# Patient Record
Sex: Female | Born: 1965 | Race: White | Hispanic: No | Marital: Married | Smoking: Former smoker
Health system: Southern US, Community
[De-identification: ages and names within clinical notes are randomized; demographics above are authoritative.]

## PROBLEM LIST (undated history)

## (undated) DIAGNOSIS — R519 Headache, unspecified: Secondary | ICD-10-CM

## (undated) DIAGNOSIS — E039 Hypothyroidism, unspecified: Secondary | ICD-10-CM

## (undated) DIAGNOSIS — N186 End stage renal disease: Secondary | ICD-10-CM

## (undated) DIAGNOSIS — N289 Disorder of kidney and ureter, unspecified: Secondary | ICD-10-CM

## (undated) DIAGNOSIS — D899 Disorder involving the immune mechanism, unspecified: Secondary | ICD-10-CM

## (undated) DIAGNOSIS — J189 Pneumonia, unspecified organism: Secondary | ICD-10-CM

## (undated) DIAGNOSIS — F32A Depression, unspecified: Secondary | ICD-10-CM

## (undated) DIAGNOSIS — F329 Major depressive disorder, single episode, unspecified: Secondary | ICD-10-CM

## (undated) DIAGNOSIS — N189 Chronic kidney disease, unspecified: Secondary | ICD-10-CM

## (undated) DIAGNOSIS — I10 Essential (primary) hypertension: Secondary | ICD-10-CM

## (undated) DIAGNOSIS — R51 Headache: Secondary | ICD-10-CM

## (undated) DIAGNOSIS — M1990 Unspecified osteoarthritis, unspecified site: Secondary | ICD-10-CM

## (undated) DIAGNOSIS — E119 Type 2 diabetes mellitus without complications: Secondary | ICD-10-CM

## (undated) DIAGNOSIS — D849 Immunodeficiency, unspecified: Secondary | ICD-10-CM

## (undated) HISTORY — DX: Depression, unspecified: F32.A

## (undated) HISTORY — PX: BACK SURGERY: SHX140

## (undated) HISTORY — DX: Major depressive disorder, single episode, unspecified: F32.9

## (undated) HISTORY — DX: Disorder of kidney and ureter, unspecified: N28.9

## (undated) HISTORY — DX: Immunodeficiency, unspecified: D84.9

## (undated) HISTORY — PX: CATARACT EXTRACTION W/ INTRAOCULAR LENS  IMPLANT, BILATERAL: SHX1307

## (undated) HISTORY — PX: TOE AMPUTATION: SHX809

## (undated) HISTORY — DX: End stage renal disease: N18.6

## (undated) HISTORY — PX: DG AV DIALYSIS GRAFT DECLOT OR: HXRAD813

## (undated) HISTORY — PX: NEPHRECTOMY TRANSPLANTED ORGAN: SUR880

## (undated) HISTORY — DX: Disorder involving the immune mechanism, unspecified: D89.9

## (undated) HISTORY — PX: AV FISTULA PLACEMENT: SHX1204

## (undated) HISTORY — DX: Essential (primary) hypertension: I10

## (undated) HISTORY — DX: Hypothyroidism, unspecified: E03.9

---

## 1997-12-18 ENCOUNTER — Inpatient Hospital Stay (HOSPITAL_COMMUNITY): Admission: AD | Admit: 1997-12-18 | Discharge: 1997-12-20 | Payer: Self-pay | Admitting: Internal Medicine

## 1997-12-26 ENCOUNTER — Encounter: Admission: RE | Admit: 1997-12-26 | Discharge: 1997-12-26 | Payer: Self-pay | Admitting: Internal Medicine

## 1998-05-24 ENCOUNTER — Inpatient Hospital Stay: Admission: EM | Admit: 1998-05-24 | Discharge: 1998-05-25 | Payer: Self-pay | Admitting: Emergency Medicine

## 1998-05-26 ENCOUNTER — Encounter: Payer: Self-pay | Admitting: Emergency Medicine

## 1998-11-24 ENCOUNTER — Encounter: Payer: Self-pay | Admitting: Infectious Diseases

## 1998-11-24 ENCOUNTER — Encounter: Payer: Self-pay | Admitting: Emergency Medicine

## 1998-11-24 ENCOUNTER — Inpatient Hospital Stay (HOSPITAL_COMMUNITY): Admission: EM | Admit: 1998-11-24 | Discharge: 1998-11-28 | Payer: Self-pay | Admitting: Emergency Medicine

## 1999-02-13 ENCOUNTER — Encounter: Admission: RE | Admit: 1999-02-13 | Discharge: 1999-02-13 | Payer: Self-pay | Admitting: Hematology and Oncology

## 1999-02-26 ENCOUNTER — Encounter: Admission: RE | Admit: 1999-02-26 | Discharge: 1999-02-26 | Payer: Self-pay | Admitting: Obstetrics & Gynecology

## 1999-02-26 ENCOUNTER — Encounter: Admission: RE | Admit: 1999-02-26 | Discharge: 1999-05-27 | Payer: Self-pay | Admitting: Obstetrics & Gynecology

## 1999-02-26 ENCOUNTER — Other Ambulatory Visit: Admission: RE | Admit: 1999-02-26 | Discharge: 1999-02-26 | Payer: Self-pay | Admitting: Obstetrics & Gynecology

## 1999-02-26 ENCOUNTER — Ambulatory Visit (HOSPITAL_COMMUNITY): Admission: RE | Admit: 1999-02-26 | Discharge: 1999-02-26 | Payer: Self-pay | Admitting: Obstetrics & Gynecology

## 1999-02-26 ENCOUNTER — Encounter: Payer: Self-pay | Admitting: Obstetrics & Gynecology

## 1999-03-05 ENCOUNTER — Encounter: Admission: RE | Admit: 1999-03-05 | Discharge: 1999-03-05 | Payer: Self-pay | Admitting: Obstetrics & Gynecology

## 1999-03-12 ENCOUNTER — Encounter: Payer: Self-pay | Admitting: Obstetrics & Gynecology

## 1999-03-12 ENCOUNTER — Encounter: Admission: RE | Admit: 1999-03-12 | Discharge: 1999-03-12 | Payer: Self-pay | Admitting: Obstetrics & Gynecology

## 1999-03-12 ENCOUNTER — Ambulatory Visit (HOSPITAL_COMMUNITY): Admission: RE | Admit: 1999-03-12 | Discharge: 1999-03-12 | Payer: Self-pay | Admitting: Obstetrics & Gynecology

## 1999-03-16 ENCOUNTER — Emergency Department (HOSPITAL_COMMUNITY): Admission: EM | Admit: 1999-03-16 | Discharge: 1999-03-16 | Payer: Self-pay | Admitting: Emergency Medicine

## 1999-03-17 ENCOUNTER — Inpatient Hospital Stay (HOSPITAL_COMMUNITY): Admission: AD | Admit: 1999-03-17 | Discharge: 1999-03-18 | Payer: Self-pay | Admitting: Obstetrics & Gynecology

## 1999-03-17 ENCOUNTER — Encounter: Payer: Self-pay | Admitting: Obstetrics & Gynecology

## 1999-03-19 ENCOUNTER — Encounter: Admission: RE | Admit: 1999-03-19 | Discharge: 1999-03-19 | Payer: Self-pay | Admitting: Obstetrics & Gynecology

## 1999-03-26 ENCOUNTER — Encounter: Admission: RE | Admit: 1999-03-26 | Discharge: 1999-03-26 | Payer: Self-pay | Admitting: Obstetrics & Gynecology

## 1999-04-02 ENCOUNTER — Encounter: Admission: RE | Admit: 1999-04-02 | Discharge: 1999-04-02 | Payer: Self-pay | Admitting: Obstetrics & Gynecology

## 1999-04-09 ENCOUNTER — Encounter: Admission: RE | Admit: 1999-04-09 | Discharge: 1999-04-09 | Payer: Self-pay | Admitting: Obstetrics & Gynecology

## 1999-04-16 ENCOUNTER — Encounter: Admission: RE | Admit: 1999-04-16 | Discharge: 1999-04-16 | Payer: Self-pay | Admitting: Obstetrics & Gynecology

## 1999-04-23 ENCOUNTER — Encounter: Payer: Self-pay | Admitting: *Deleted

## 1999-04-23 ENCOUNTER — Inpatient Hospital Stay (HOSPITAL_COMMUNITY): Admission: RE | Admit: 1999-04-23 | Discharge: 1999-04-26 | Payer: Self-pay | Admitting: *Deleted

## 1999-04-30 ENCOUNTER — Encounter: Admission: RE | Admit: 1999-04-30 | Discharge: 1999-04-30 | Payer: Self-pay | Admitting: Obstetrics & Gynecology

## 1999-05-02 ENCOUNTER — Observation Stay (HOSPITAL_COMMUNITY): Admission: RE | Admit: 1999-05-02 | Discharge: 1999-05-03 | Payer: Self-pay | Admitting: *Deleted

## 1999-05-07 ENCOUNTER — Ambulatory Visit (HOSPITAL_COMMUNITY): Admission: RE | Admit: 1999-05-07 | Discharge: 1999-05-07 | Payer: Self-pay | Admitting: *Deleted

## 1999-05-14 ENCOUNTER — Encounter: Admission: RE | Admit: 1999-05-14 | Discharge: 1999-05-14 | Payer: Self-pay | Admitting: Obstetrics & Gynecology

## 1999-05-28 ENCOUNTER — Encounter: Admission: RE | Admit: 1999-05-28 | Discharge: 1999-05-28 | Payer: Self-pay | Admitting: Obstetrics & Gynecology

## 1999-06-04 ENCOUNTER — Encounter: Admission: RE | Admit: 1999-06-04 | Discharge: 1999-06-04 | Payer: Self-pay | Admitting: Obstetrics & Gynecology

## 1999-06-18 ENCOUNTER — Encounter: Admission: RE | Admit: 1999-06-18 | Discharge: 1999-06-18 | Payer: Self-pay | Admitting: Obstetrics & Gynecology

## 1999-06-23 ENCOUNTER — Ambulatory Visit (HOSPITAL_COMMUNITY): Admission: RE | Admit: 1999-06-23 | Discharge: 1999-06-23 | Payer: Self-pay | Admitting: Obstetrics & Gynecology

## 1999-06-23 ENCOUNTER — Encounter: Payer: Self-pay | Admitting: Obstetrics & Gynecology

## 1999-06-25 ENCOUNTER — Encounter: Admission: RE | Admit: 1999-06-25 | Discharge: 1999-06-25 | Payer: Self-pay | Admitting: Obstetrics & Gynecology

## 1999-07-09 ENCOUNTER — Encounter: Admission: RE | Admit: 1999-07-09 | Discharge: 1999-07-09 | Payer: Self-pay | Admitting: Obstetrics & Gynecology

## 1999-07-09 HISTORY — PX: TUBAL LIGATION: SHX77

## 1999-07-16 ENCOUNTER — Encounter: Admission: RE | Admit: 1999-07-16 | Discharge: 1999-07-16 | Payer: Self-pay | Admitting: Obstetrics & Gynecology

## 1999-07-24 ENCOUNTER — Inpatient Hospital Stay (HOSPITAL_COMMUNITY): Admission: RE | Admit: 1999-07-24 | Discharge: 1999-08-02 | Payer: Self-pay | Admitting: Obstetrics & Gynecology

## 1999-07-24 ENCOUNTER — Encounter (INDEPENDENT_AMBULATORY_CARE_PROVIDER_SITE_OTHER): Payer: Self-pay | Admitting: Specialist

## 1999-07-25 ENCOUNTER — Encounter: Payer: Self-pay | Admitting: Obstetrics & Gynecology

## 1999-07-27 ENCOUNTER — Encounter: Payer: Self-pay | Admitting: Obstetrics & Gynecology

## 1999-08-04 ENCOUNTER — Inpatient Hospital Stay (HOSPITAL_COMMUNITY): Admission: EM | Admit: 1999-08-04 | Discharge: 1999-08-04 | Payer: Self-pay | Admitting: *Deleted

## 1999-08-14 ENCOUNTER — Inpatient Hospital Stay (HOSPITAL_COMMUNITY): Admission: RE | Admit: 1999-08-14 | Discharge: 1999-08-15 | Payer: Self-pay | Admitting: Ophthalmology

## 1999-08-14 ENCOUNTER — Encounter: Payer: Self-pay | Admitting: Ophthalmology

## 1999-08-20 ENCOUNTER — Encounter: Admission: RE | Admit: 1999-08-20 | Discharge: 1999-08-20 | Payer: Self-pay | Admitting: Internal Medicine

## 1999-08-20 ENCOUNTER — Inpatient Hospital Stay (HOSPITAL_COMMUNITY): Admission: AD | Admit: 1999-08-20 | Discharge: 1999-08-20 | Payer: Self-pay | Admitting: Obstetrics and Gynecology

## 1999-08-27 ENCOUNTER — Encounter: Admission: RE | Admit: 1999-08-27 | Discharge: 1999-08-27 | Payer: Self-pay | Admitting: Obstetrics & Gynecology

## 1999-09-08 HISTORY — PX: LUMBAR DISC SURGERY: SHX700

## 1999-10-01 ENCOUNTER — Encounter: Admission: RE | Admit: 1999-10-01 | Discharge: 1999-12-26 | Payer: Self-pay | Admitting: Internal Medicine

## 1999-10-04 ENCOUNTER — Encounter: Payer: Self-pay | Admitting: Emergency Medicine

## 1999-10-04 ENCOUNTER — Emergency Department (HOSPITAL_COMMUNITY): Admission: EM | Admit: 1999-10-04 | Discharge: 1999-10-04 | Payer: Self-pay | Admitting: Emergency Medicine

## 1999-10-08 ENCOUNTER — Encounter: Admission: RE | Admit: 1999-10-08 | Discharge: 1999-10-08 | Payer: Self-pay | Admitting: Internal Medicine

## 1999-10-29 ENCOUNTER — Inpatient Hospital Stay (HOSPITAL_COMMUNITY): Admission: EM | Admit: 1999-10-29 | Discharge: 1999-11-02 | Payer: Self-pay | Admitting: *Deleted

## 1999-10-29 ENCOUNTER — Encounter: Payer: Self-pay | Admitting: *Deleted

## 1999-10-30 ENCOUNTER — Encounter: Payer: Self-pay | Admitting: Ophthalmology

## 1999-10-30 ENCOUNTER — Encounter: Payer: Self-pay | Admitting: Internal Medicine

## 1999-11-11 ENCOUNTER — Encounter: Admission: RE | Admit: 1999-11-11 | Discharge: 1999-11-11 | Payer: Self-pay | Admitting: Hematology and Oncology

## 1999-12-07 ENCOUNTER — Inpatient Hospital Stay (HOSPITAL_COMMUNITY): Admission: EM | Admit: 1999-12-07 | Discharge: 1999-12-08 | Payer: Self-pay | Admitting: Emergency Medicine

## 2000-02-25 ENCOUNTER — Encounter: Payer: Self-pay | Admitting: Emergency Medicine

## 2000-02-25 ENCOUNTER — Emergency Department (HOSPITAL_COMMUNITY): Admission: EM | Admit: 2000-02-25 | Discharge: 2000-02-25 | Payer: Self-pay | Admitting: Emergency Medicine

## 2000-02-26 ENCOUNTER — Encounter: Admission: RE | Admit: 2000-02-26 | Discharge: 2000-02-26 | Payer: Self-pay | Admitting: Internal Medicine

## 2000-03-22 ENCOUNTER — Emergency Department (HOSPITAL_COMMUNITY): Admission: EM | Admit: 2000-03-22 | Discharge: 2000-03-22 | Payer: Self-pay | Admitting: *Deleted

## 2000-03-29 ENCOUNTER — Emergency Department (HOSPITAL_COMMUNITY): Admission: EM | Admit: 2000-03-29 | Discharge: 2000-03-29 | Payer: Self-pay | Admitting: Internal Medicine

## 2000-05-16 ENCOUNTER — Encounter: Payer: Self-pay | Admitting: Emergency Medicine

## 2000-05-16 ENCOUNTER — Inpatient Hospital Stay (HOSPITAL_COMMUNITY): Admission: EM | Admit: 2000-05-16 | Discharge: 2000-05-17 | Payer: Self-pay | Admitting: Emergency Medicine

## 2000-06-03 ENCOUNTER — Encounter: Admission: RE | Admit: 2000-06-03 | Discharge: 2000-06-03 | Payer: Self-pay | Admitting: Internal Medicine

## 2000-08-12 ENCOUNTER — Ambulatory Visit (HOSPITAL_COMMUNITY): Admission: RE | Admit: 2000-08-12 | Discharge: 2000-08-13 | Payer: Self-pay | Admitting: Ophthalmology

## 2000-11-20 ENCOUNTER — Emergency Department (HOSPITAL_COMMUNITY): Admission: EM | Admit: 2000-11-20 | Discharge: 2000-11-21 | Payer: Self-pay | Admitting: Emergency Medicine

## 2001-04-18 ENCOUNTER — Emergency Department (HOSPITAL_COMMUNITY): Admission: EM | Admit: 2001-04-18 | Discharge: 2001-04-18 | Payer: Self-pay | Admitting: Emergency Medicine

## 2001-04-18 ENCOUNTER — Encounter: Payer: Self-pay | Admitting: Emergency Medicine

## 2001-05-26 ENCOUNTER — Ambulatory Visit (HOSPITAL_COMMUNITY): Admission: RE | Admit: 2001-05-26 | Discharge: 2001-05-27 | Payer: Self-pay | Admitting: Neurosurgery

## 2001-05-26 ENCOUNTER — Encounter: Payer: Self-pay | Admitting: Neurosurgery

## 2001-07-23 ENCOUNTER — Encounter: Payer: Self-pay | Admitting: Emergency Medicine

## 2001-07-23 ENCOUNTER — Emergency Department (HOSPITAL_COMMUNITY): Admission: EM | Admit: 2001-07-23 | Discharge: 2001-07-23 | Payer: Self-pay | Admitting: Emergency Medicine

## 2001-07-23 ENCOUNTER — Encounter: Payer: Self-pay | Admitting: *Deleted

## 2001-09-03 ENCOUNTER — Encounter: Payer: Self-pay | Admitting: Emergency Medicine

## 2001-09-03 ENCOUNTER — Emergency Department (HOSPITAL_COMMUNITY): Admission: EM | Admit: 2001-09-03 | Discharge: 2001-09-03 | Payer: Self-pay | Admitting: Emergency Medicine

## 2001-09-09 ENCOUNTER — Emergency Department (HOSPITAL_COMMUNITY): Admission: EM | Admit: 2001-09-09 | Discharge: 2001-09-09 | Payer: Self-pay | Admitting: Emergency Medicine

## 2001-09-09 ENCOUNTER — Encounter: Payer: Self-pay | Admitting: Emergency Medicine

## 2001-09-13 ENCOUNTER — Encounter: Admission: RE | Admit: 2001-09-13 | Discharge: 2001-09-13 | Payer: Self-pay

## 2001-09-21 ENCOUNTER — Ambulatory Visit (HOSPITAL_COMMUNITY): Admission: RE | Admit: 2001-09-21 | Discharge: 2001-09-21 | Payer: Self-pay

## 2001-09-28 ENCOUNTER — Encounter: Admission: RE | Admit: 2001-09-28 | Discharge: 2001-09-28 | Payer: Self-pay | Admitting: Internal Medicine

## 2001-09-28 ENCOUNTER — Emergency Department (HOSPITAL_COMMUNITY): Admission: EM | Admit: 2001-09-28 | Discharge: 2001-09-28 | Payer: Self-pay | Admitting: Emergency Medicine

## 2001-10-05 ENCOUNTER — Emergency Department (HOSPITAL_COMMUNITY): Admission: EM | Admit: 2001-10-05 | Discharge: 2001-10-05 | Payer: Self-pay | Admitting: Emergency Medicine

## 2001-10-10 ENCOUNTER — Encounter: Admission: RE | Admit: 2001-10-10 | Discharge: 2001-10-10 | Payer: Self-pay | Admitting: Internal Medicine

## 2001-12-03 ENCOUNTER — Emergency Department (HOSPITAL_COMMUNITY): Admission: EM | Admit: 2001-12-03 | Discharge: 2001-12-03 | Payer: Self-pay

## 2002-01-03 ENCOUNTER — Encounter: Payer: Self-pay | Admitting: Emergency Medicine

## 2002-01-03 ENCOUNTER — Emergency Department (HOSPITAL_COMMUNITY): Admission: EM | Admit: 2002-01-03 | Discharge: 2002-01-03 | Payer: Self-pay | Admitting: Emergency Medicine

## 2002-01-09 ENCOUNTER — Encounter: Admission: RE | Admit: 2002-01-09 | Discharge: 2002-01-09 | Payer: Self-pay

## 2002-01-10 ENCOUNTER — Encounter: Payer: Self-pay | Admitting: Emergency Medicine

## 2002-01-10 ENCOUNTER — Inpatient Hospital Stay (HOSPITAL_COMMUNITY): Admission: EM | Admit: 2002-01-10 | Discharge: 2002-01-13 | Payer: Self-pay | Admitting: Emergency Medicine

## 2002-01-11 ENCOUNTER — Encounter: Payer: Self-pay | Admitting: Internal Medicine

## 2002-01-12 ENCOUNTER — Encounter: Payer: Self-pay | Admitting: Internal Medicine

## 2002-02-10 ENCOUNTER — Inpatient Hospital Stay (HOSPITAL_COMMUNITY): Admission: EM | Admit: 2002-02-10 | Discharge: 2002-02-11 | Payer: Self-pay | Admitting: Emergency Medicine

## 2002-02-10 ENCOUNTER — Encounter: Payer: Self-pay | Admitting: Emergency Medicine

## 2002-03-11 ENCOUNTER — Emergency Department (HOSPITAL_COMMUNITY): Admission: EM | Admit: 2002-03-11 | Discharge: 2002-03-11 | Payer: Self-pay | Admitting: Emergency Medicine

## 2002-03-16 ENCOUNTER — Encounter: Admission: RE | Admit: 2002-03-16 | Discharge: 2002-03-16 | Payer: Self-pay | Admitting: Internal Medicine

## 2002-04-23 ENCOUNTER — Emergency Department (HOSPITAL_COMMUNITY): Admission: EM | Admit: 2002-04-23 | Discharge: 2002-04-23 | Payer: Self-pay | Admitting: Emergency Medicine

## 2002-05-31 ENCOUNTER — Emergency Department (HOSPITAL_COMMUNITY): Admission: EM | Admit: 2002-05-31 | Discharge: 2002-05-31 | Payer: Self-pay

## 2002-06-21 ENCOUNTER — Encounter: Payer: Self-pay | Admitting: Emergency Medicine

## 2002-06-21 ENCOUNTER — Emergency Department (HOSPITAL_COMMUNITY): Admission: EM | Admit: 2002-06-21 | Discharge: 2002-06-22 | Payer: Self-pay | Admitting: *Deleted

## 2002-08-18 ENCOUNTER — Encounter: Payer: Self-pay | Admitting: Emergency Medicine

## 2002-08-18 ENCOUNTER — Emergency Department (HOSPITAL_COMMUNITY): Admission: EM | Admit: 2002-08-18 | Discharge: 2002-08-18 | Payer: Self-pay | Admitting: Emergency Medicine

## 2002-08-28 ENCOUNTER — Encounter: Payer: Self-pay | Admitting: Emergency Medicine

## 2002-08-28 ENCOUNTER — Emergency Department (HOSPITAL_COMMUNITY): Admission: EM | Admit: 2002-08-28 | Discharge: 2002-08-28 | Payer: Self-pay | Admitting: Emergency Medicine

## 2002-09-14 ENCOUNTER — Encounter: Admission: RE | Admit: 2002-09-14 | Discharge: 2002-09-14 | Payer: Self-pay | Admitting: Internal Medicine

## 2002-10-05 ENCOUNTER — Other Ambulatory Visit: Admission: RE | Admit: 2002-10-05 | Discharge: 2002-10-05 | Payer: Self-pay | Admitting: Family Medicine

## 2002-10-05 ENCOUNTER — Encounter: Admission: RE | Admit: 2002-10-05 | Discharge: 2002-10-05 | Payer: Self-pay | Admitting: *Deleted

## 2002-10-24 ENCOUNTER — Emergency Department (HOSPITAL_COMMUNITY): Admission: EM | Admit: 2002-10-24 | Discharge: 2002-10-24 | Payer: Self-pay | Admitting: Emergency Medicine

## 2002-11-13 ENCOUNTER — Encounter: Payer: Self-pay | Admitting: Emergency Medicine

## 2002-11-13 ENCOUNTER — Emergency Department (HOSPITAL_COMMUNITY): Admission: EM | Admit: 2002-11-13 | Discharge: 2002-11-14 | Payer: Self-pay | Admitting: Emergency Medicine

## 2002-12-16 ENCOUNTER — Emergency Department (HOSPITAL_COMMUNITY): Admission: EM | Admit: 2002-12-16 | Discharge: 2002-12-17 | Payer: Self-pay | Admitting: Emergency Medicine

## 2002-12-24 ENCOUNTER — Emergency Department (HOSPITAL_COMMUNITY): Admission: EM | Admit: 2002-12-24 | Discharge: 2002-12-24 | Payer: Self-pay | Admitting: Emergency Medicine

## 2002-12-26 ENCOUNTER — Emergency Department (HOSPITAL_COMMUNITY): Admission: EM | Admit: 2002-12-26 | Discharge: 2002-12-26 | Payer: Self-pay | Admitting: Emergency Medicine

## 2003-01-03 ENCOUNTER — Encounter: Admission: RE | Admit: 2003-01-03 | Discharge: 2003-01-03 | Payer: Self-pay | Admitting: Internal Medicine

## 2003-02-26 ENCOUNTER — Observation Stay (HOSPITAL_COMMUNITY): Admission: EM | Admit: 2003-02-26 | Discharge: 2003-02-27 | Payer: Self-pay | Admitting: Emergency Medicine

## 2003-05-28 ENCOUNTER — Emergency Department (HOSPITAL_COMMUNITY): Admission: EM | Admit: 2003-05-28 | Discharge: 2003-05-28 | Payer: Self-pay | Admitting: Emergency Medicine

## 2003-05-28 ENCOUNTER — Encounter: Payer: Self-pay | Admitting: Emergency Medicine

## 2003-05-30 ENCOUNTER — Encounter: Admission: RE | Admit: 2003-05-30 | Discharge: 2003-05-30 | Payer: Self-pay | Admitting: Internal Medicine

## 2003-06-02 ENCOUNTER — Emergency Department (HOSPITAL_COMMUNITY): Admission: EM | Admit: 2003-06-02 | Discharge: 2003-06-02 | Payer: Self-pay | Admitting: Emergency Medicine

## 2003-06-04 ENCOUNTER — Emergency Department (HOSPITAL_COMMUNITY): Admission: EM | Admit: 2003-06-04 | Discharge: 2003-06-04 | Payer: Self-pay | Admitting: Emergency Medicine

## 2003-06-08 ENCOUNTER — Inpatient Hospital Stay (HOSPITAL_COMMUNITY): Admission: RE | Admit: 2003-06-08 | Discharge: 2003-06-13 | Payer: Self-pay | Admitting: Infectious Diseases

## 2003-06-08 ENCOUNTER — Encounter: Admission: RE | Admit: 2003-06-08 | Discharge: 2003-06-08 | Payer: Self-pay | Admitting: Internal Medicine

## 2003-06-09 ENCOUNTER — Encounter: Payer: Self-pay | Admitting: Infectious Diseases

## 2003-06-10 ENCOUNTER — Encounter (INDEPENDENT_AMBULATORY_CARE_PROVIDER_SITE_OTHER): Payer: Self-pay | Admitting: *Deleted

## 2003-10-10 ENCOUNTER — Emergency Department (HOSPITAL_COMMUNITY): Admission: EM | Admit: 2003-10-10 | Discharge: 2003-10-10 | Payer: Self-pay | Admitting: Emergency Medicine

## 2003-11-01 ENCOUNTER — Inpatient Hospital Stay (HOSPITAL_COMMUNITY): Admission: EM | Admit: 2003-11-01 | Discharge: 2003-11-06 | Payer: Self-pay | Admitting: Emergency Medicine

## 2003-11-13 ENCOUNTER — Encounter: Admission: RE | Admit: 2003-11-13 | Discharge: 2003-11-13 | Payer: Self-pay | Admitting: Internal Medicine

## 2003-11-16 ENCOUNTER — Encounter: Admission: RE | Admit: 2003-11-16 | Discharge: 2003-11-16 | Payer: Self-pay | Admitting: Internal Medicine

## 2003-11-19 ENCOUNTER — Encounter: Admission: RE | Admit: 2003-11-19 | Discharge: 2003-11-19 | Payer: Self-pay | Admitting: Internal Medicine

## 2003-11-22 ENCOUNTER — Encounter: Admission: RE | Admit: 2003-11-22 | Discharge: 2003-11-22 | Payer: Self-pay | Admitting: Internal Medicine

## 2003-11-29 ENCOUNTER — Encounter: Admission: RE | Admit: 2003-11-29 | Discharge: 2003-11-29 | Payer: Self-pay | Admitting: Internal Medicine

## 2003-12-05 ENCOUNTER — Encounter (HOSPITAL_BASED_OUTPATIENT_CLINIC_OR_DEPARTMENT_OTHER): Admission: RE | Admit: 2003-12-05 | Discharge: 2004-02-05 | Payer: Self-pay | Admitting: Internal Medicine

## 2003-12-07 ENCOUNTER — Encounter: Admission: RE | Admit: 2003-12-07 | Discharge: 2003-12-07 | Payer: Self-pay | Admitting: Internal Medicine

## 2003-12-12 ENCOUNTER — Encounter: Admission: RE | Admit: 2003-12-12 | Discharge: 2003-12-12 | Payer: Self-pay | Admitting: Internal Medicine

## 2003-12-21 ENCOUNTER — Inpatient Hospital Stay (HOSPITAL_COMMUNITY): Admission: EM | Admit: 2003-12-21 | Discharge: 2003-12-26 | Payer: Self-pay | Admitting: Emergency Medicine

## 2004-01-10 ENCOUNTER — Emergency Department (HOSPITAL_COMMUNITY): Admission: EM | Admit: 2004-01-10 | Discharge: 2004-01-10 | Payer: Self-pay | Admitting: Family Medicine

## 2004-01-13 ENCOUNTER — Inpatient Hospital Stay (HOSPITAL_COMMUNITY): Admission: EM | Admit: 2004-01-13 | Discharge: 2004-01-16 | Payer: Self-pay | Admitting: Emergency Medicine

## 2004-01-14 ENCOUNTER — Encounter (INDEPENDENT_AMBULATORY_CARE_PROVIDER_SITE_OTHER): Payer: Self-pay | Admitting: *Deleted

## 2004-01-18 ENCOUNTER — Encounter: Admission: RE | Admit: 2004-01-18 | Discharge: 2004-01-18 | Payer: Self-pay | Admitting: Internal Medicine

## 2004-01-21 ENCOUNTER — Encounter: Admission: RE | Admit: 2004-01-21 | Discharge: 2004-01-21 | Payer: Self-pay | Admitting: Internal Medicine

## 2004-01-25 ENCOUNTER — Encounter: Admission: RE | Admit: 2004-01-25 | Discharge: 2004-01-25 | Payer: Self-pay | Admitting: Internal Medicine

## 2004-02-01 ENCOUNTER — Encounter: Admission: RE | Admit: 2004-02-01 | Discharge: 2004-02-01 | Payer: Self-pay | Admitting: Internal Medicine

## 2004-02-06 ENCOUNTER — Encounter (HOSPITAL_BASED_OUTPATIENT_CLINIC_OR_DEPARTMENT_OTHER): Admission: RE | Admit: 2004-02-06 | Discharge: 2004-05-06 | Payer: Self-pay | Admitting: Internal Medicine

## 2004-02-08 ENCOUNTER — Encounter: Admission: RE | Admit: 2004-02-08 | Discharge: 2004-02-08 | Payer: Self-pay | Admitting: Internal Medicine

## 2004-02-13 ENCOUNTER — Encounter: Admission: RE | Admit: 2004-02-13 | Discharge: 2004-02-13 | Payer: Self-pay | Admitting: Internal Medicine

## 2004-03-20 ENCOUNTER — Ambulatory Visit (HOSPITAL_COMMUNITY): Admission: RE | Admit: 2004-03-20 | Discharge: 2004-03-20 | Payer: Self-pay | Admitting: Vascular Surgery

## 2004-03-30 ENCOUNTER — Emergency Department (HOSPITAL_COMMUNITY): Admission: EM | Admit: 2004-03-30 | Discharge: 2004-03-30 | Payer: Self-pay | Admitting: Emergency Medicine

## 2004-03-31 ENCOUNTER — Ambulatory Visit (HOSPITAL_COMMUNITY): Admission: RE | Admit: 2004-03-31 | Discharge: 2004-03-31 | Payer: Self-pay | Admitting: Vascular Surgery

## 2004-04-21 ENCOUNTER — Ambulatory Visit (HOSPITAL_COMMUNITY): Admission: RE | Admit: 2004-04-21 | Discharge: 2004-04-21 | Payer: Self-pay | Admitting: Vascular Surgery

## 2004-04-26 ENCOUNTER — Emergency Department (HOSPITAL_COMMUNITY): Admission: EM | Admit: 2004-04-26 | Discharge: 2004-04-26 | Payer: Self-pay | Admitting: Emergency Medicine

## 2004-05-25 ENCOUNTER — Emergency Department (HOSPITAL_COMMUNITY): Admission: EM | Admit: 2004-05-25 | Discharge: 2004-05-25 | Payer: Self-pay | Admitting: Emergency Medicine

## 2004-07-04 ENCOUNTER — Inpatient Hospital Stay (HOSPITAL_COMMUNITY): Admission: EM | Admit: 2004-07-04 | Discharge: 2004-07-06 | Payer: Self-pay | Admitting: Emergency Medicine

## 2004-07-04 ENCOUNTER — Ambulatory Visit: Payer: Self-pay | Admitting: Infectious Diseases

## 2004-08-02 ENCOUNTER — Emergency Department (HOSPITAL_COMMUNITY): Admission: EM | Admit: 2004-08-02 | Discharge: 2004-08-02 | Payer: Self-pay | Admitting: Emergency Medicine

## 2004-09-10 ENCOUNTER — Inpatient Hospital Stay (HOSPITAL_COMMUNITY): Admission: EM | Admit: 2004-09-10 | Discharge: 2004-09-13 | Payer: Self-pay | Admitting: Emergency Medicine

## 2004-09-10 ENCOUNTER — Ambulatory Visit: Payer: Self-pay | Admitting: Internal Medicine

## 2004-09-16 ENCOUNTER — Ambulatory Visit: Payer: Self-pay | Admitting: Internal Medicine

## 2004-12-12 ENCOUNTER — Emergency Department (HOSPITAL_COMMUNITY): Admission: EM | Admit: 2004-12-12 | Discharge: 2004-12-13 | Payer: Self-pay | Admitting: Emergency Medicine

## 2004-12-15 ENCOUNTER — Emergency Department (HOSPITAL_COMMUNITY): Admission: EM | Admit: 2004-12-15 | Discharge: 2004-12-15 | Payer: Self-pay | Admitting: Emergency Medicine

## 2004-12-23 ENCOUNTER — Ambulatory Visit (HOSPITAL_COMMUNITY): Admission: RE | Admit: 2004-12-23 | Discharge: 2004-12-23 | Payer: Self-pay | Admitting: Orthopaedic Surgery

## 2005-01-09 ENCOUNTER — Emergency Department (HOSPITAL_COMMUNITY): Admission: EM | Admit: 2005-01-09 | Discharge: 2005-01-09 | Payer: Self-pay | Admitting: Emergency Medicine

## 2005-01-14 ENCOUNTER — Ambulatory Visit: Payer: Self-pay | Admitting: Cardiology

## 2005-01-14 ENCOUNTER — Observation Stay (HOSPITAL_COMMUNITY): Admission: EM | Admit: 2005-01-14 | Discharge: 2005-01-14 | Payer: Self-pay | Admitting: Emergency Medicine

## 2005-01-14 ENCOUNTER — Encounter: Payer: Self-pay | Admitting: Cardiology

## 2005-02-03 ENCOUNTER — Emergency Department (HOSPITAL_COMMUNITY): Admission: EM | Admit: 2005-02-03 | Discharge: 2005-02-04 | Payer: Self-pay | Admitting: Emergency Medicine

## 2005-02-13 ENCOUNTER — Ambulatory Visit (HOSPITAL_COMMUNITY): Admission: RE | Admit: 2005-02-13 | Discharge: 2005-02-13 | Payer: Self-pay | Admitting: Orthopaedic Surgery

## 2005-02-19 ENCOUNTER — Emergency Department (HOSPITAL_COMMUNITY): Admission: EM | Admit: 2005-02-19 | Discharge: 2005-02-19 | Payer: Self-pay | Admitting: Emergency Medicine

## 2005-02-20 ENCOUNTER — Encounter (INDEPENDENT_AMBULATORY_CARE_PROVIDER_SITE_OTHER): Payer: Self-pay | Admitting: *Deleted

## 2005-02-20 ENCOUNTER — Ambulatory Visit (HOSPITAL_COMMUNITY): Admission: RE | Admit: 2005-02-20 | Discharge: 2005-02-20 | Payer: Self-pay | Admitting: Orthopaedic Surgery

## 2005-03-21 ENCOUNTER — Inpatient Hospital Stay (HOSPITAL_COMMUNITY): Admission: EM | Admit: 2005-03-21 | Discharge: 2005-03-21 | Payer: Self-pay | Admitting: Nephrology

## 2005-08-09 ENCOUNTER — Emergency Department (HOSPITAL_COMMUNITY): Admission: EM | Admit: 2005-08-09 | Discharge: 2005-08-10 | Payer: Self-pay | Admitting: *Deleted

## 2005-08-12 ENCOUNTER — Emergency Department (HOSPITAL_COMMUNITY): Admission: EM | Admit: 2005-08-12 | Discharge: 2005-08-12 | Payer: Self-pay

## 2005-08-19 IMAGING — CR DG TIBIA/FIBULA 2V*R*
2 series · 2 of 2 positions shown · non-contrast
Comparison: none

CLINICAL DATA: Redness and blisters, sores of the right lower leg.  Patient is diabetic.  
 RIGHT TIBIA AND FIBULA
 AP and lateral views of the right tibia and fibula show generalized edema of the soft tissues of the lower leg.  No fracture, osteomyelitis or foreign body is seen.  The knee and ankle joints appear normal as far as can be seen. 
 IMPRESSION
 Generalized swelling of the subcutaneous tissues.  No definite fracture, foreign body or osteomyelitis is seen.

[view not recorded (1 of 2)]
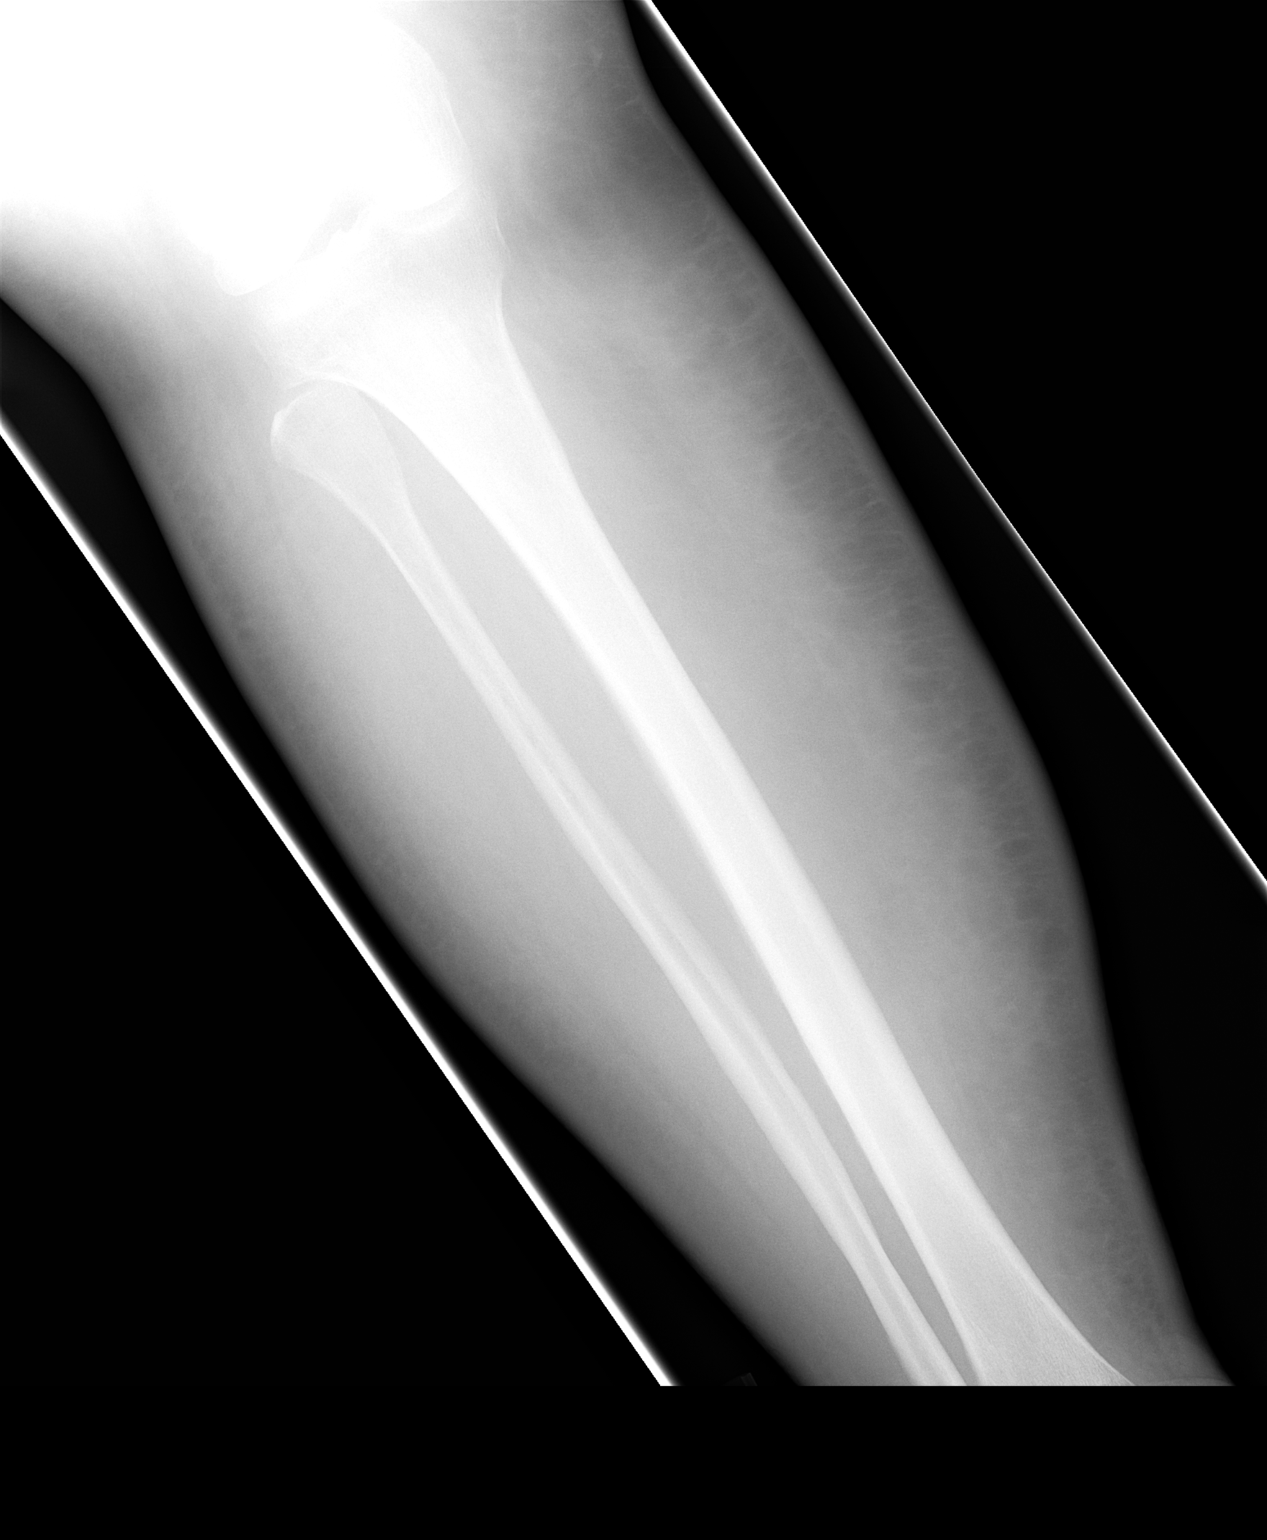

[view not recorded (2 of 2)]
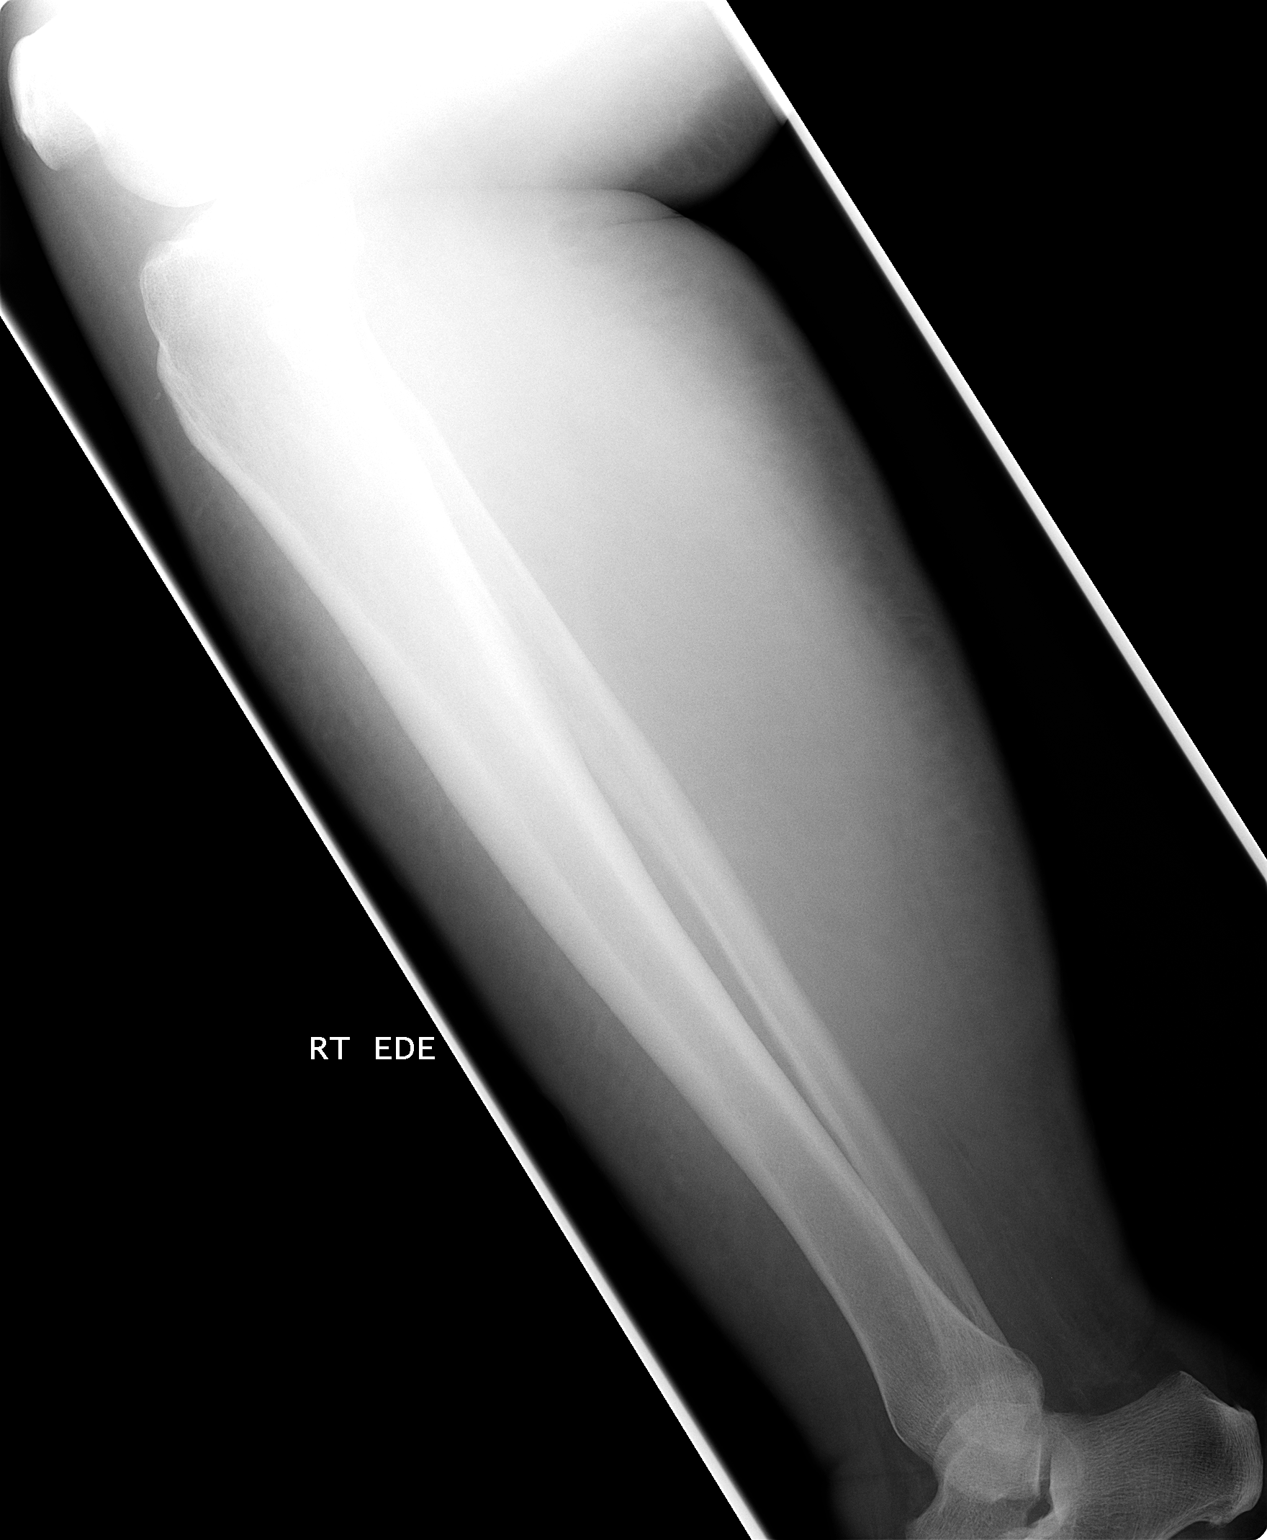

[2 of 2 positions shown; findings below may reference images not displayed]

## 2005-08-19 IMAGING — CR DG CHEST 2V
2 series · 2 of 2 positions shown · non-contrast
Comparison: none

CLINICAL DATA: Leg cellulitis. 
 CHEST, TWO VIEWS  
 The heart and mediastinal contours are within normal limits.  No focal air space opacities or effusions.  The visualized skeleton is unremarkable.
 IMPRESSION
 No acute disease.

[view not recorded (1 of 2)]
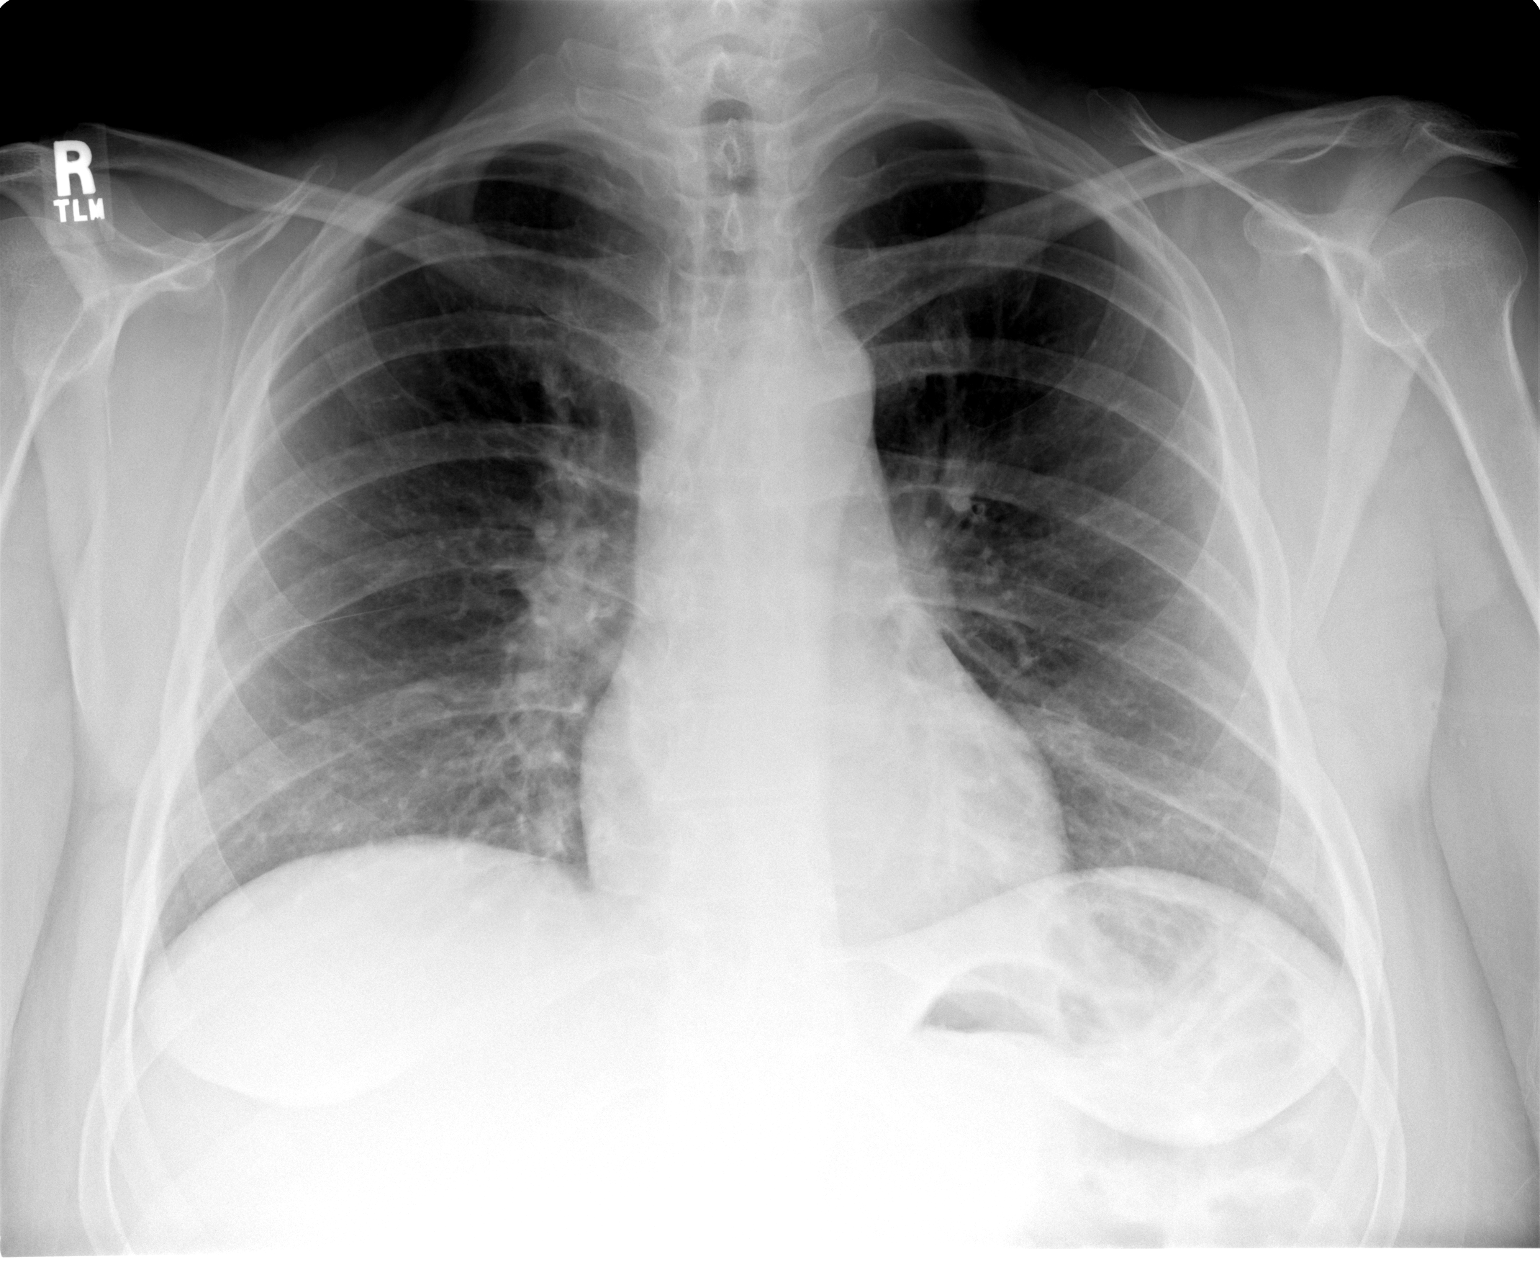

[view not recorded (2 of 2)]
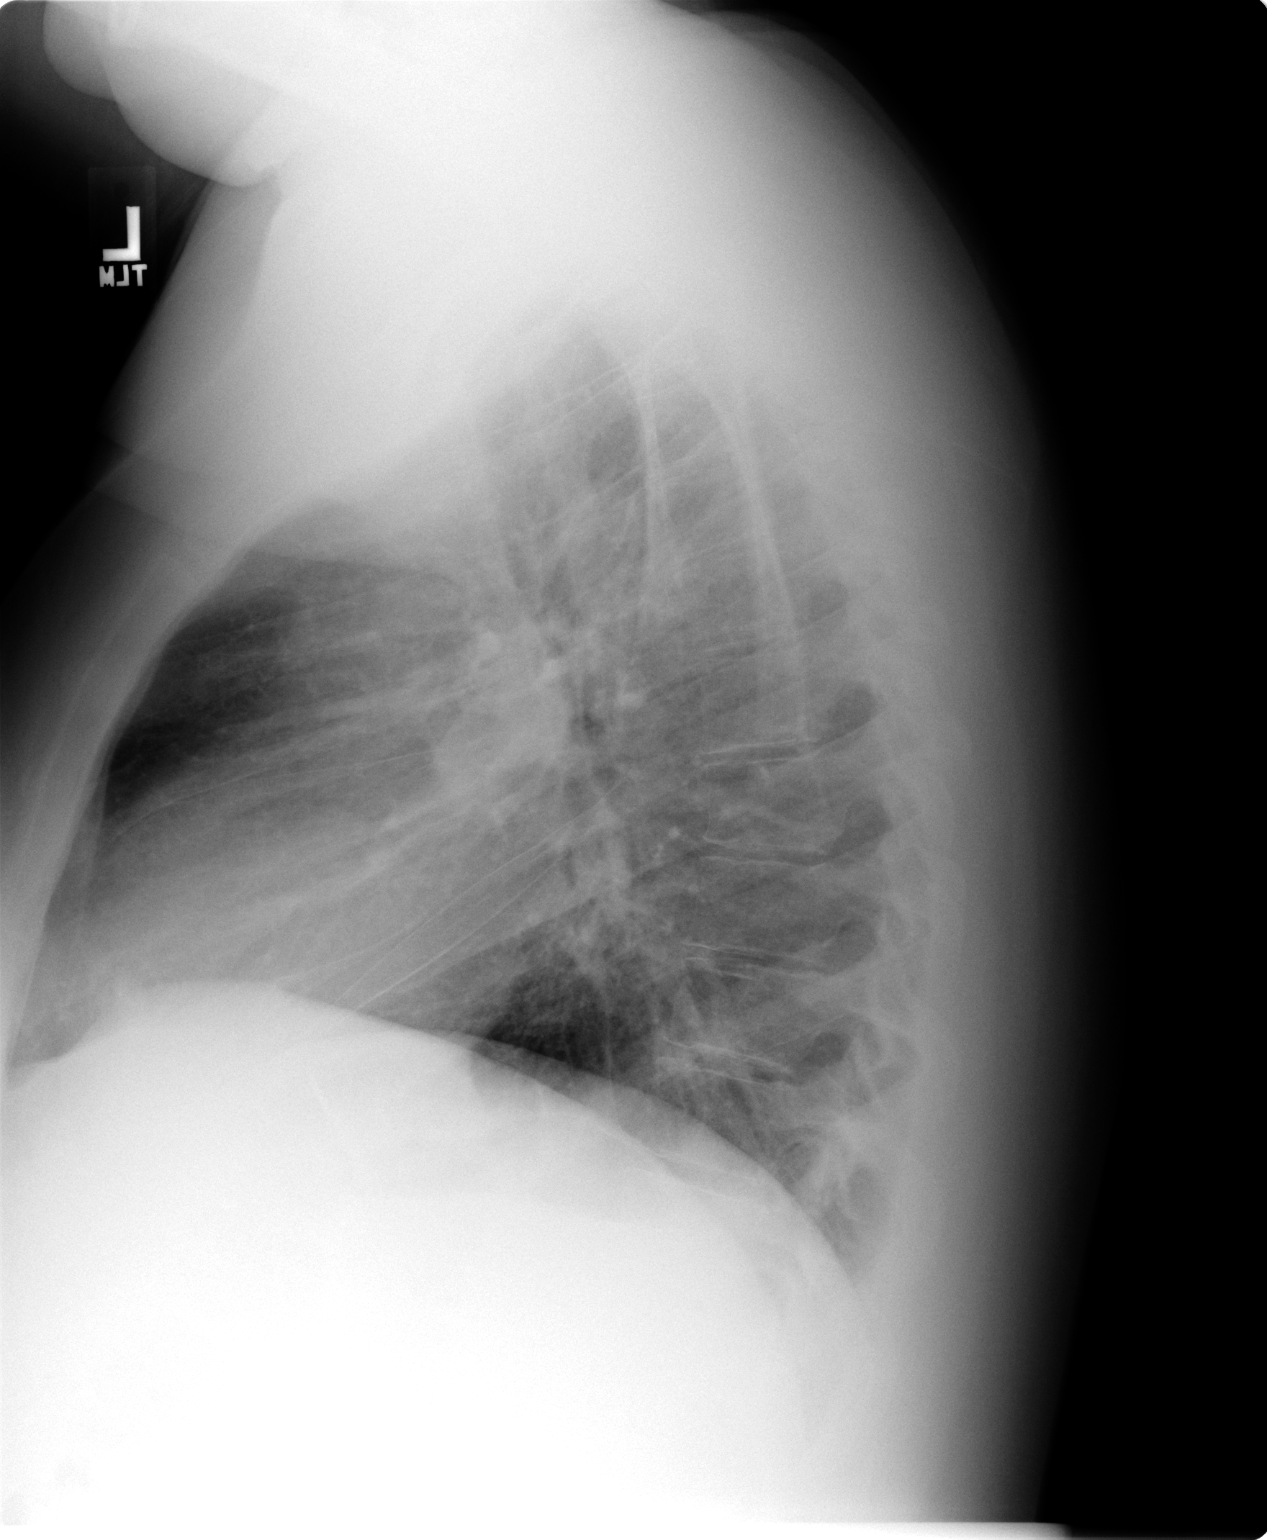

[2 of 2 positions shown; findings below may reference images not displayed]

## 2005-08-20 IMAGING — CR DG FOOT COMPLETE 3+V*R*
3 series · 3 of 3 positions shown · non-contrast
Comparison: none

CLINICAL DATA: Right foot cellulitis and ulcer.  Evaluate for osteomyelitis.
RIGHT FOOT, THREE VIEWS
Soft tissue swelling of the great toe is seen.  There is no evidence of bone destruction or periosteal reaction which is suggestive for evidence of osteomyelitis.  A subacute or chronic fracture deformity of the proximal phalanx of the little toe is noted.  No acute fractures are identified and there is no evidence of dislocation.  Incidental note is made of plantar and dorsal calcaneal spurs.
IMPRESSION 
Soft tissue swelling of the great toe.  No radiographic evidence of osteomyelitis.
Subacute or chronic fracture of the proximal phalanx of the little toe.  No acute fracture.

[view not recorded (1 of 3)]
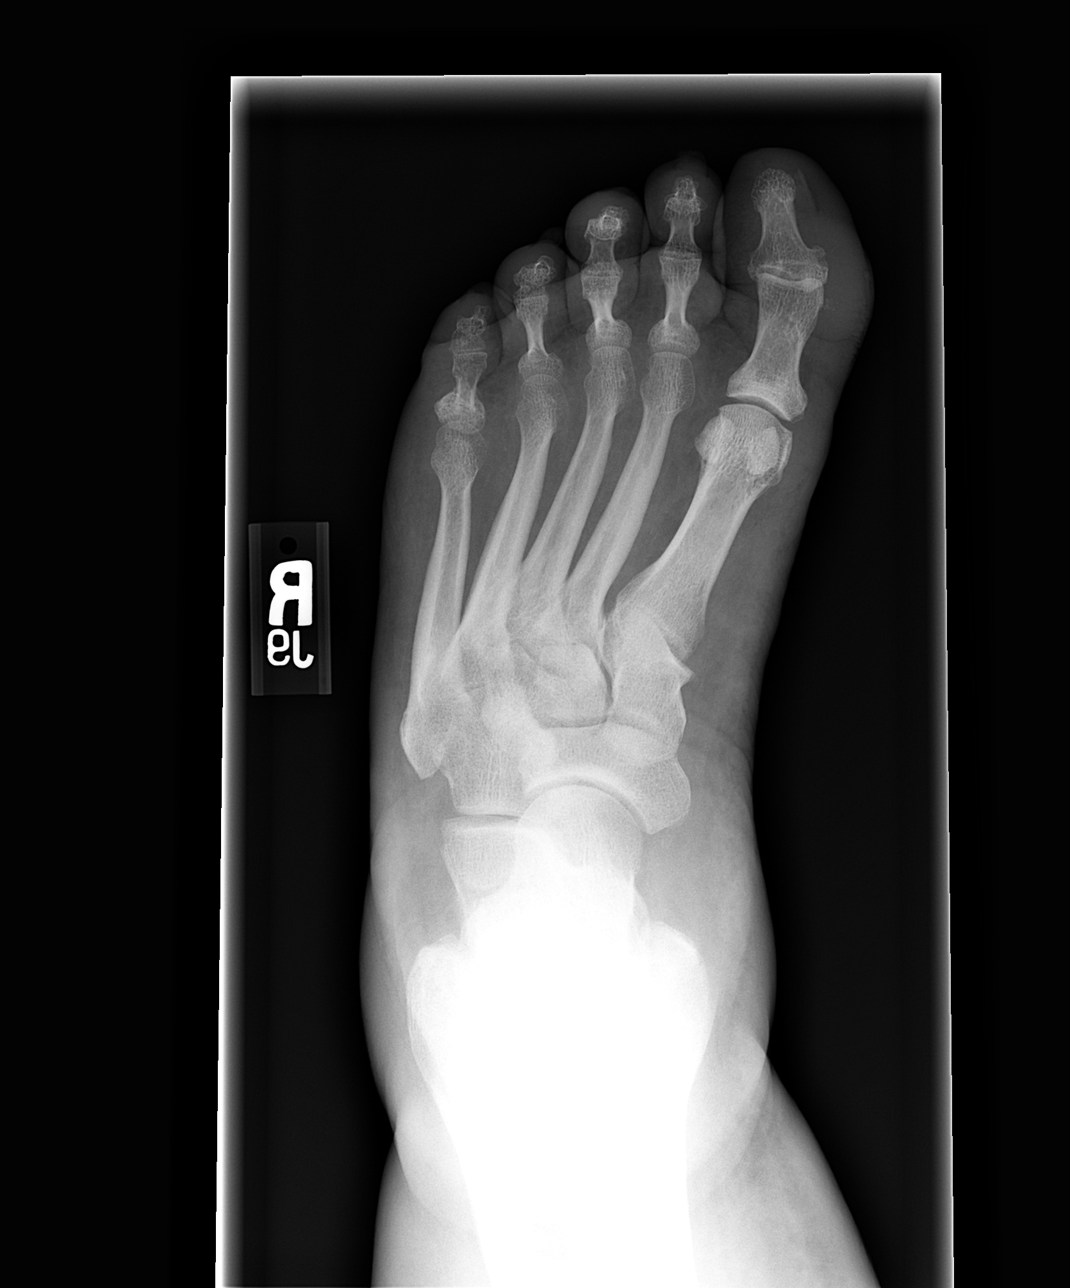

[view not recorded (2 of 3)]
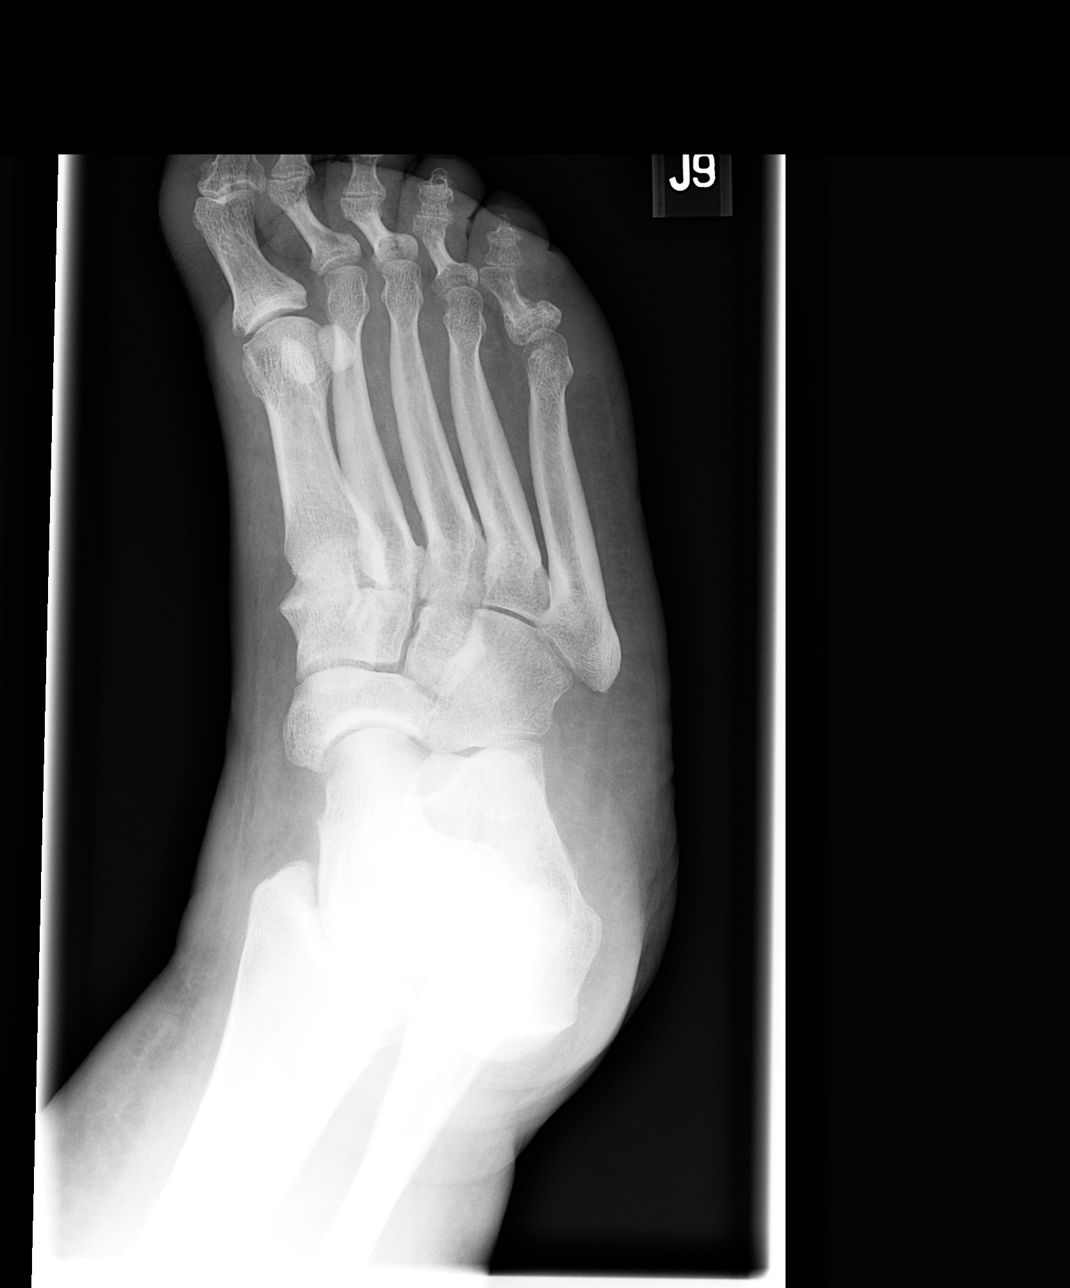

[view not recorded (3 of 3)]
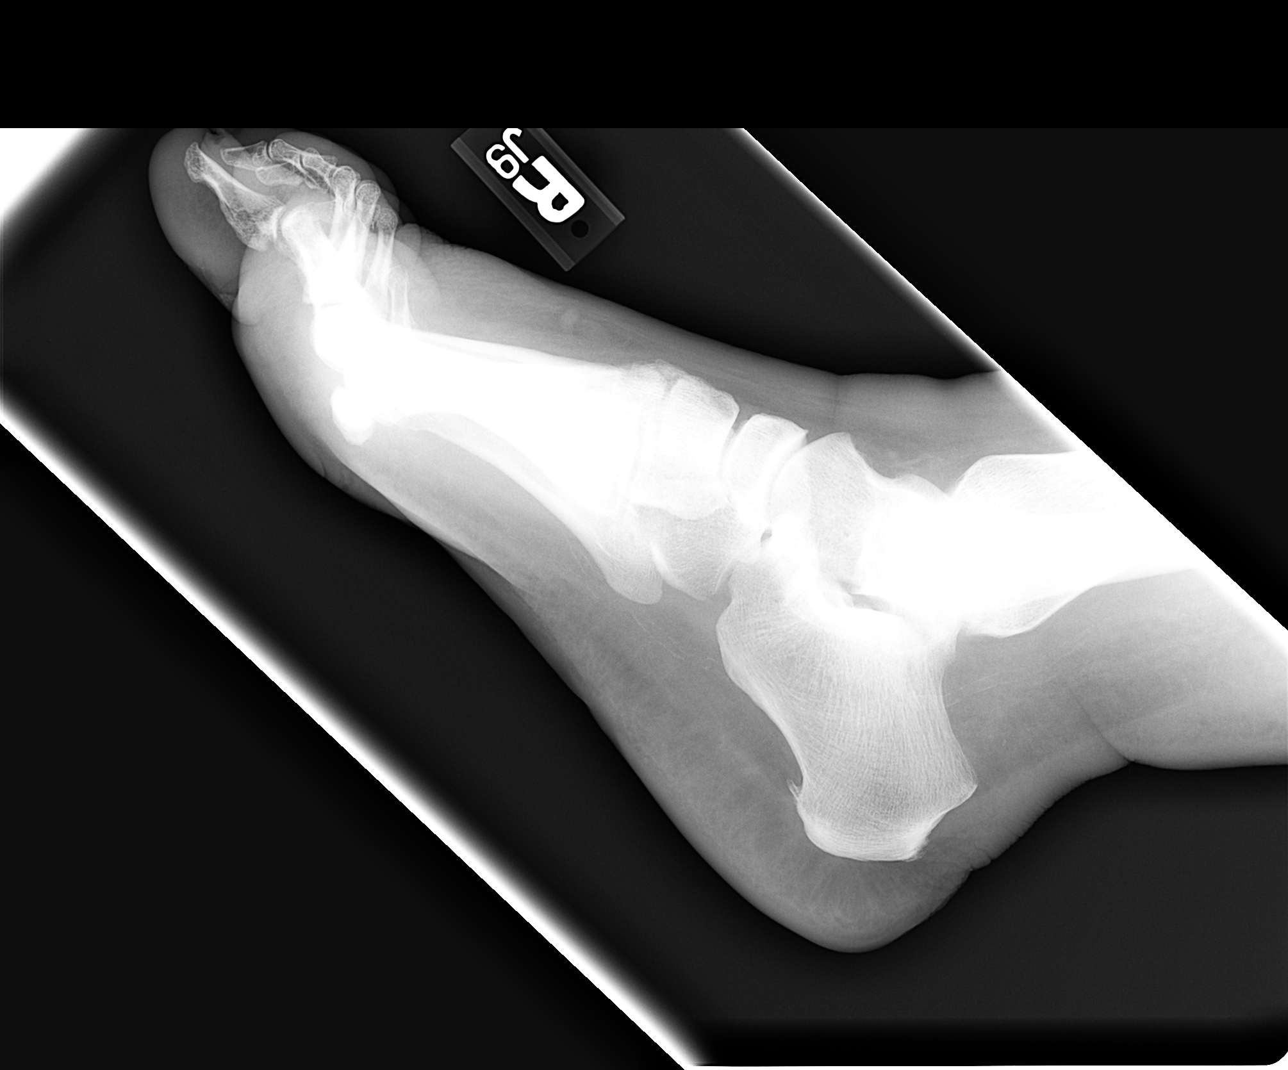

[3 of 3 positions shown; findings below may reference images not displayed]

## 2005-09-05 ENCOUNTER — Emergency Department (HOSPITAL_COMMUNITY): Admission: EM | Admit: 2005-09-05 | Discharge: 2005-09-05 | Payer: Self-pay | Admitting: Emergency Medicine

## 2005-11-01 IMAGING — US US RETROPERITONEAL COMPLETE
1 series · 14 of 24 positions shown · non-contrast
Comparison: none

CLINICAL DATA: cellulitis of the leg; renal failure; BUN 93, creatinine
 RENAL ULTRASOUND
 Right kidney measures 11.7cm and shows no evidence of mass, calcification or hydronephrosis.  Left kidney measures 11.5cm and shows no evidence of abnormality.  The bladder is partially filled and appears normal.  
 IMPRESSION
 Normal right kidney, left kidney and bladder.

[Series 1: unknown · 0.30mm/px · 14 of 24 slices shown]
[im 1/24]
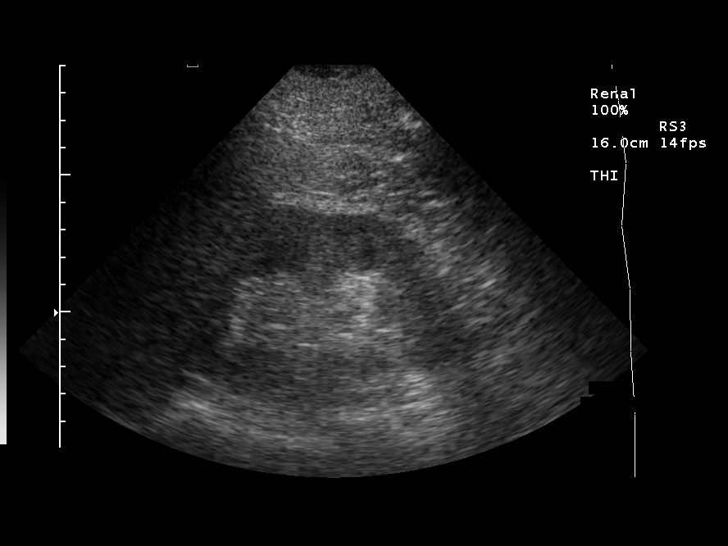
[im 3/24]
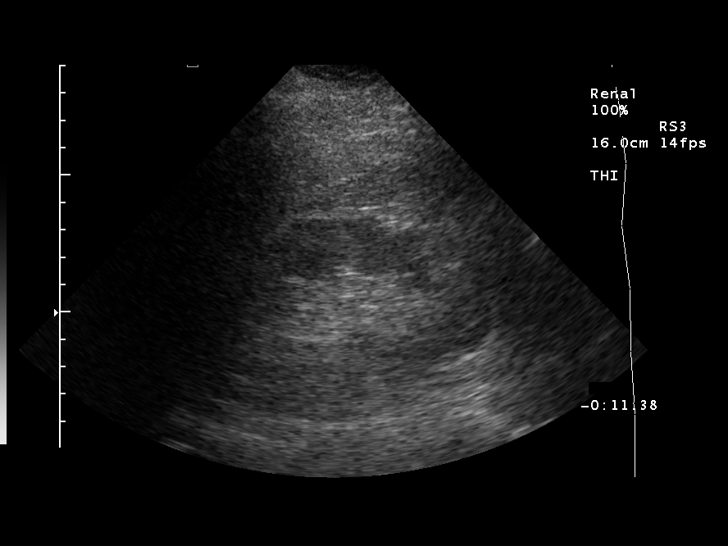
[im 5/24]
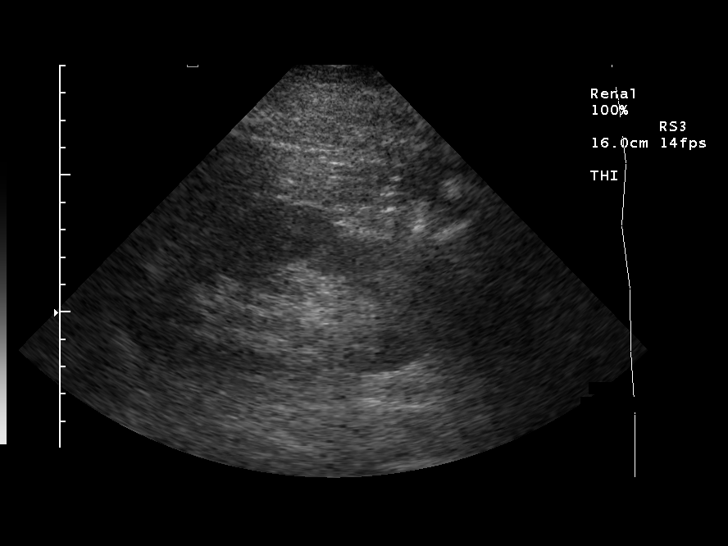
[im 7/24]
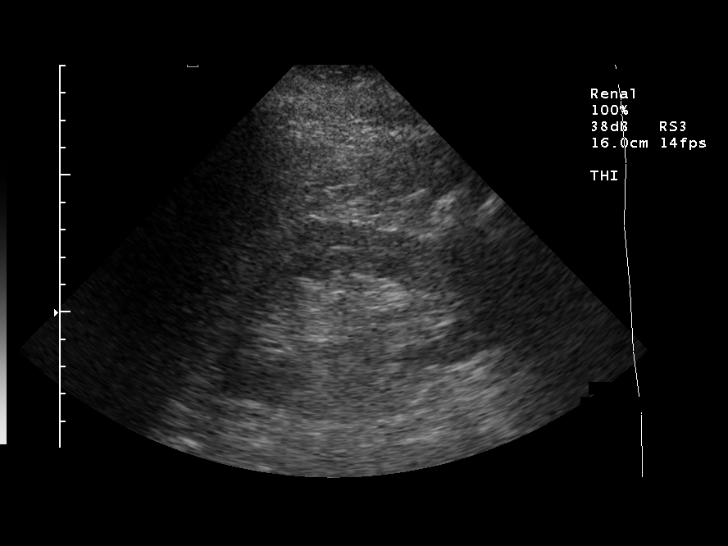
[im 8/24]
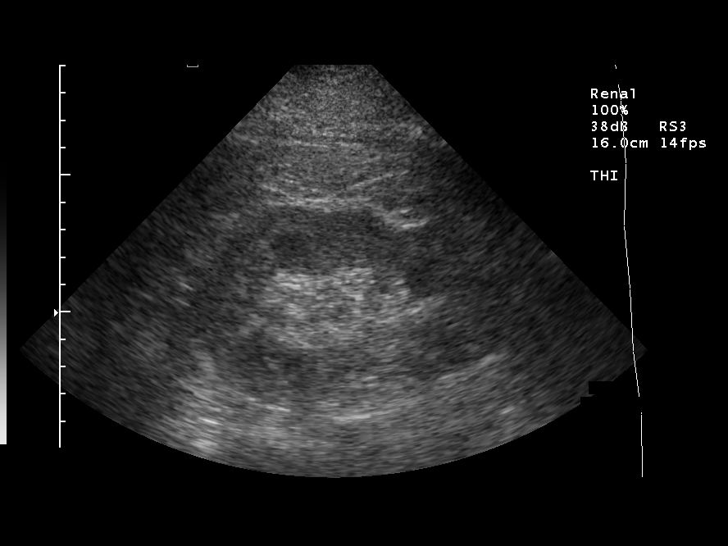
[im 10/24]
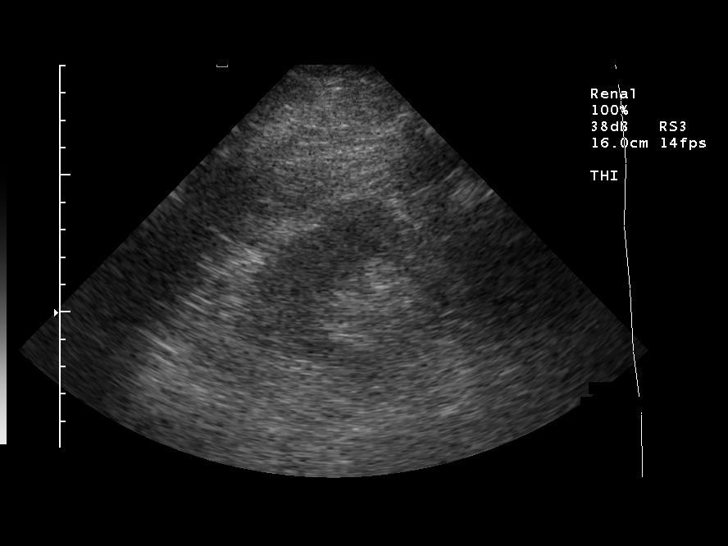
[im 12/24]
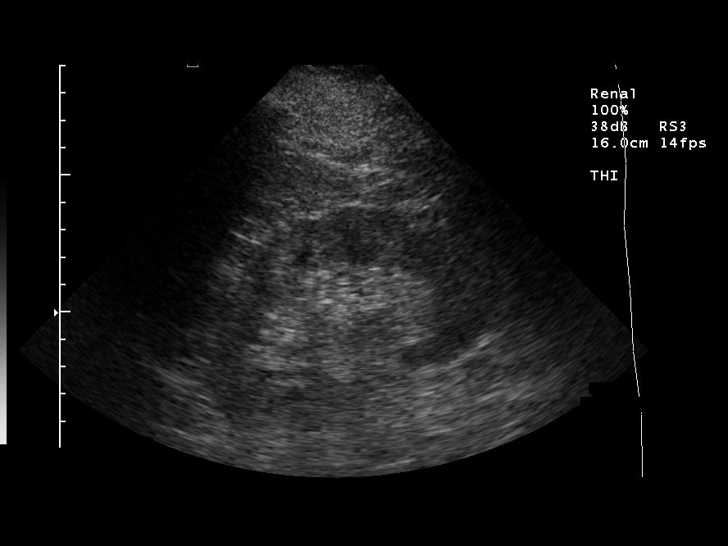
[im 13/24]
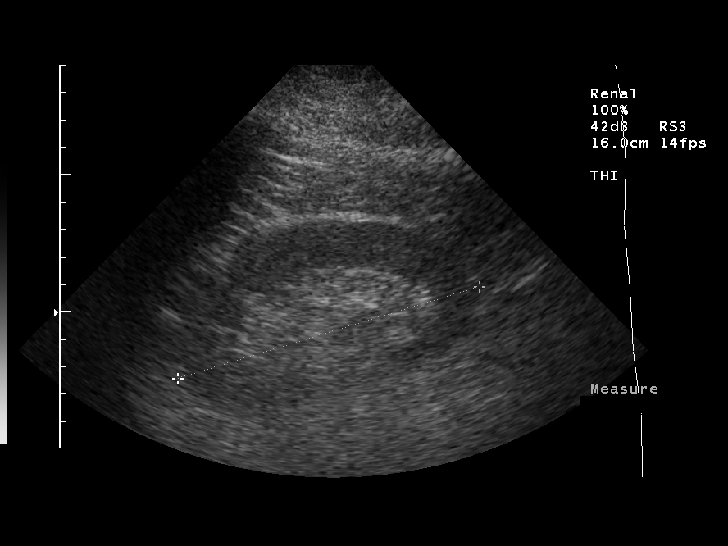
[im 15/24]
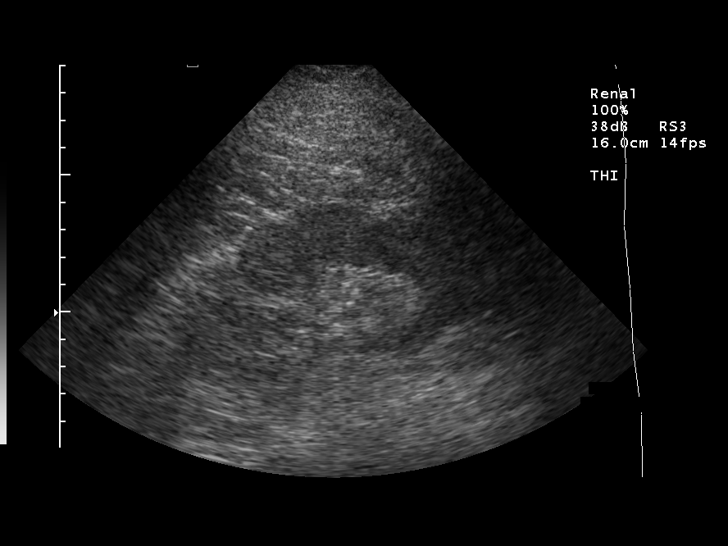
[im 17/24]
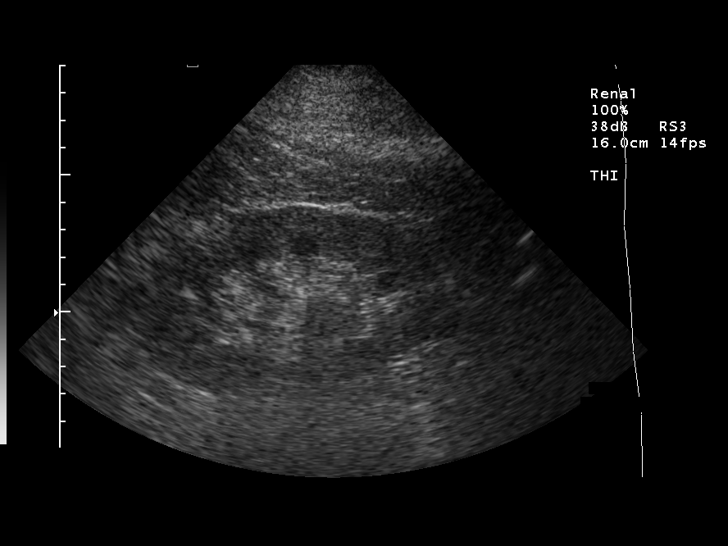
[im 19/24]
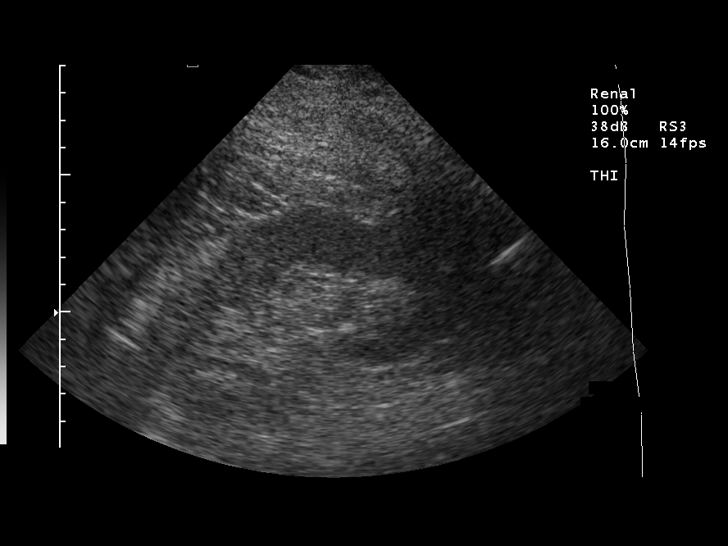
[im 20/24]
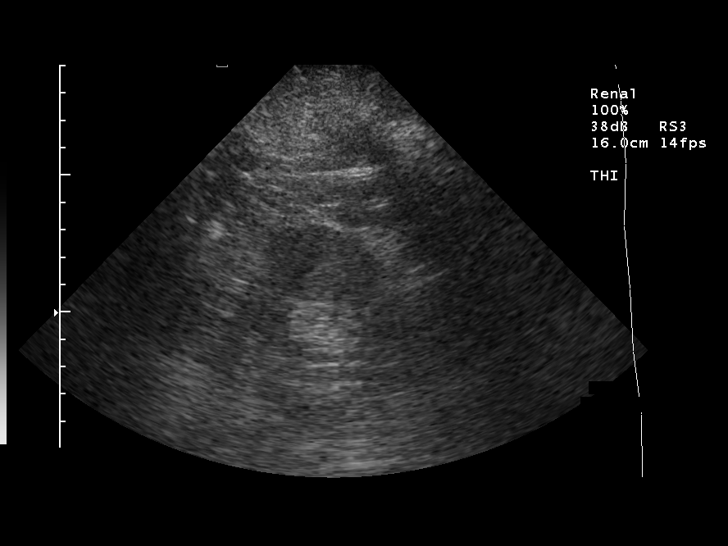
[im 22/24]
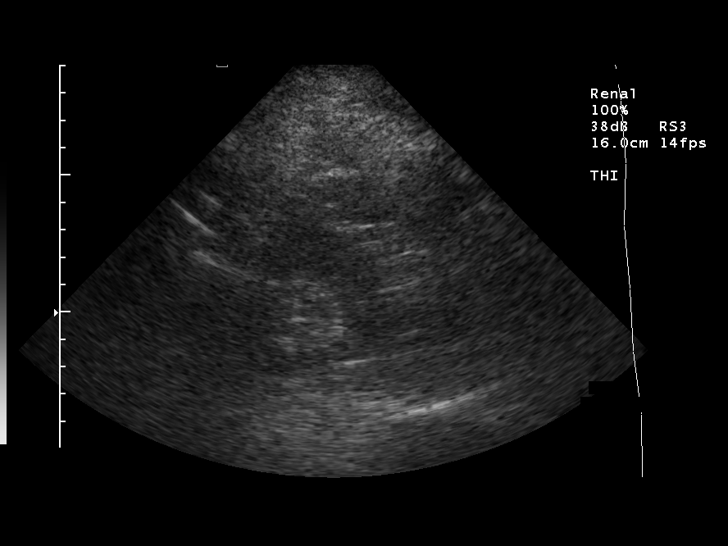
[im 24/24]
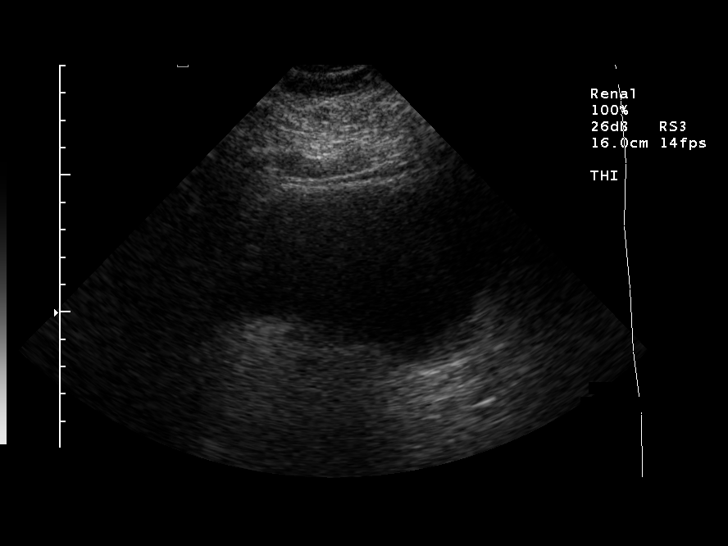

[14 of 24 positions shown; findings below may reference images not displayed]

## 2005-12-08 ENCOUNTER — Encounter: Admission: RE | Admit: 2005-12-08 | Discharge: 2005-12-08 | Payer: Self-pay | Admitting: Nephrology

## 2005-12-12 ENCOUNTER — Emergency Department (HOSPITAL_COMMUNITY): Admission: EM | Admit: 2005-12-12 | Discharge: 2005-12-12 | Payer: Self-pay | Admitting: Emergency Medicine

## 2005-12-15 ENCOUNTER — Ambulatory Visit (HOSPITAL_COMMUNITY): Admission: RE | Admit: 2005-12-15 | Discharge: 2005-12-15 | Payer: Self-pay | Admitting: Orthopaedic Surgery

## 2006-02-12 ENCOUNTER — Emergency Department (HOSPITAL_COMMUNITY): Admission: EM | Admit: 2006-02-12 | Discharge: 2006-02-12 | Payer: Self-pay | Admitting: Emergency Medicine

## 2006-04-22 IMAGING — CR DG CHEST 2V
2 series · 2 of 2 positions shown · non-contrast
Comparison: 11/01/2003.

CLINICAL DATA: Lower extremity swelling.  
 CHEST - TWO VIEW

[view not recorded (1 of 2)]
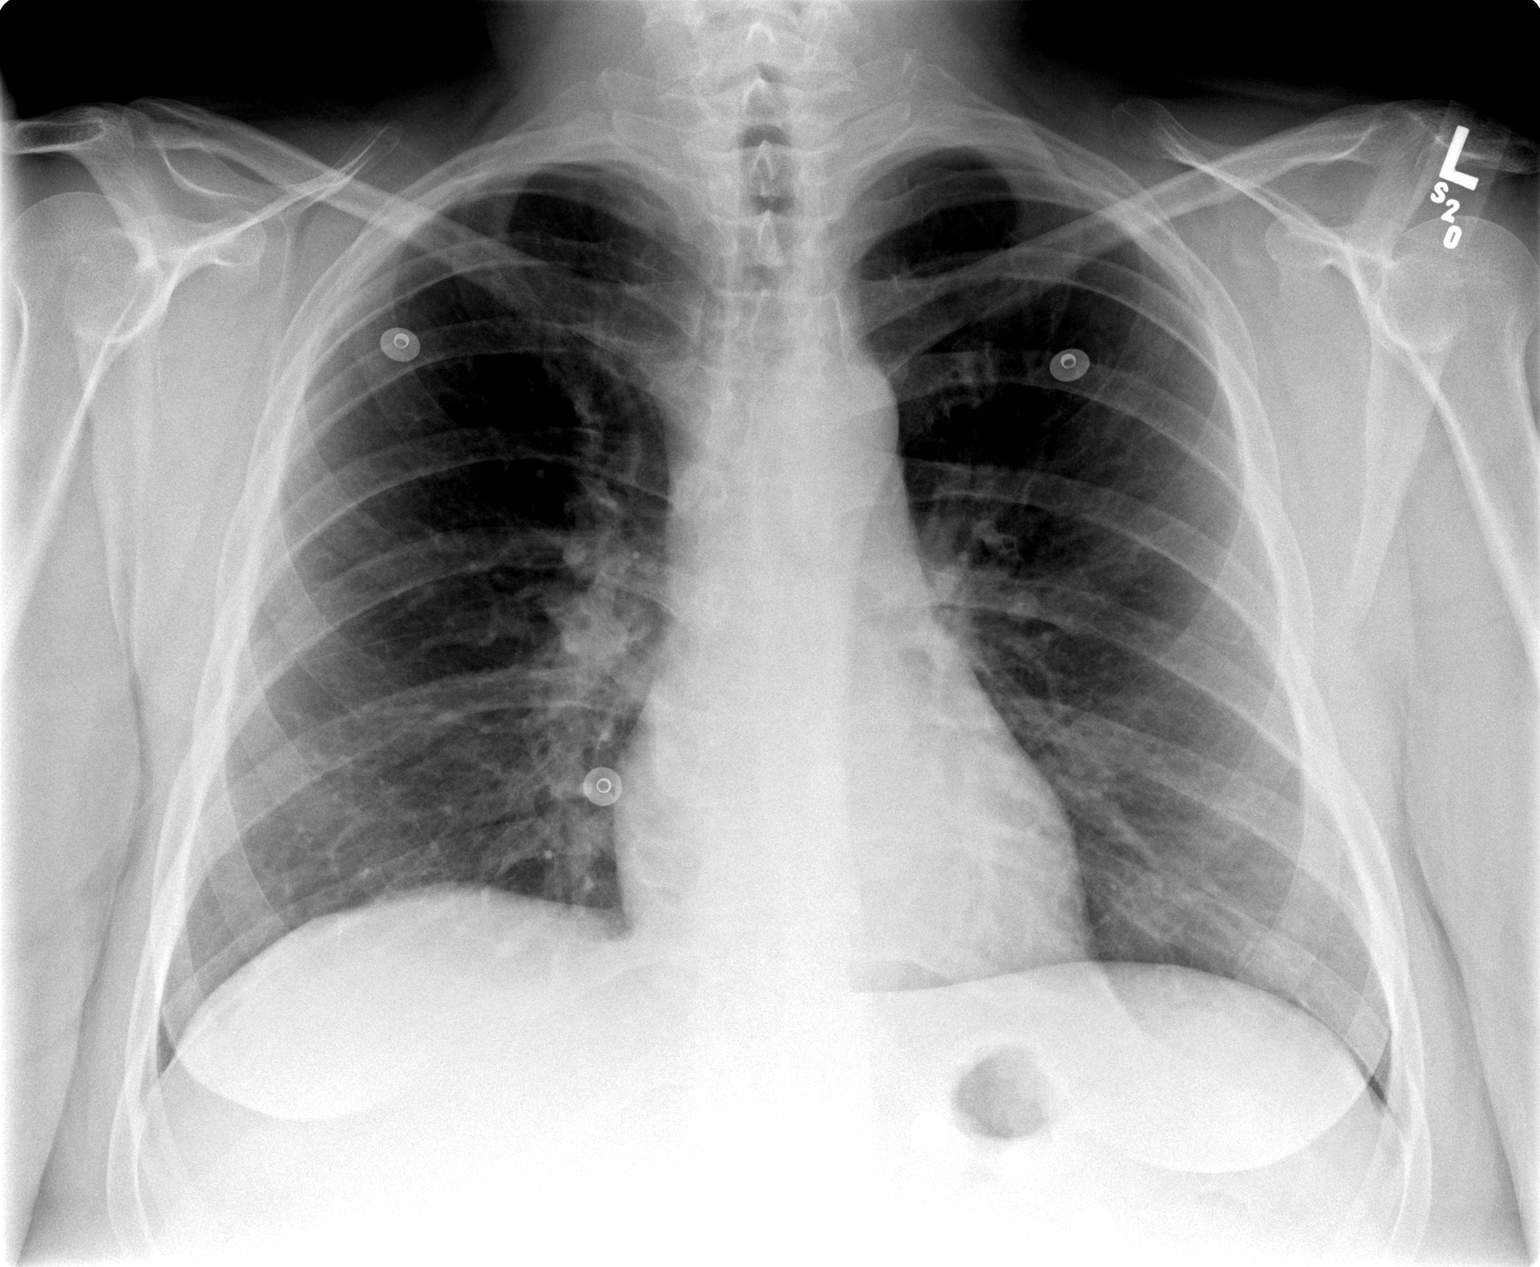

[view not recorded (2 of 2)]
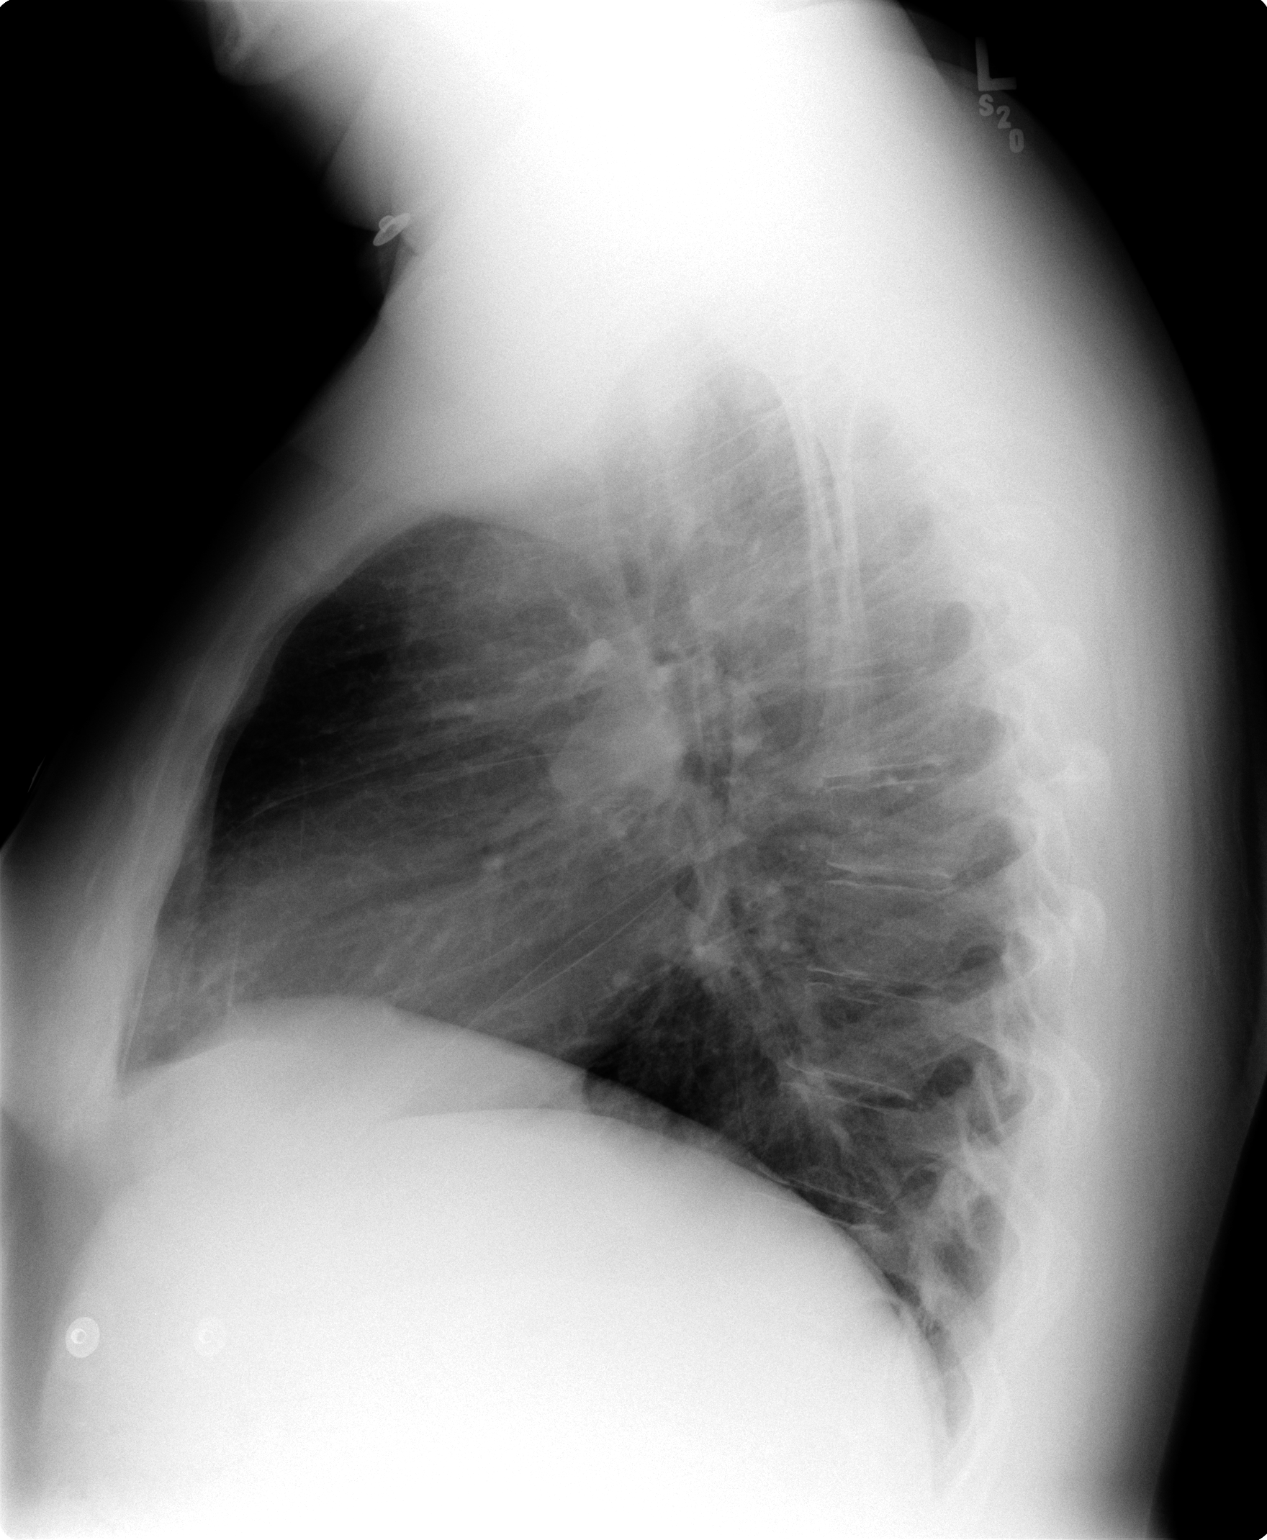

[2 of 2 positions shown; findings below may reference images not displayed]

Heart size and mediastinal contours are normal.  Both lungs are clear.  No significant change is seen since prior study.  
 IMPRESSION
 Stable chest.  No active disease.

## 2006-04-23 IMAGING — CR DG FOOT COMPLETE 3+V*R*
2 series · 2 of 2 positions shown · non-contrast
Comparison: none

CLINICAL DATA: Renal disease.  Ulcer great toe.
 COMPLETE RIGHT FOOT, 07/05/04:

[view not recorded (1 of 2)]
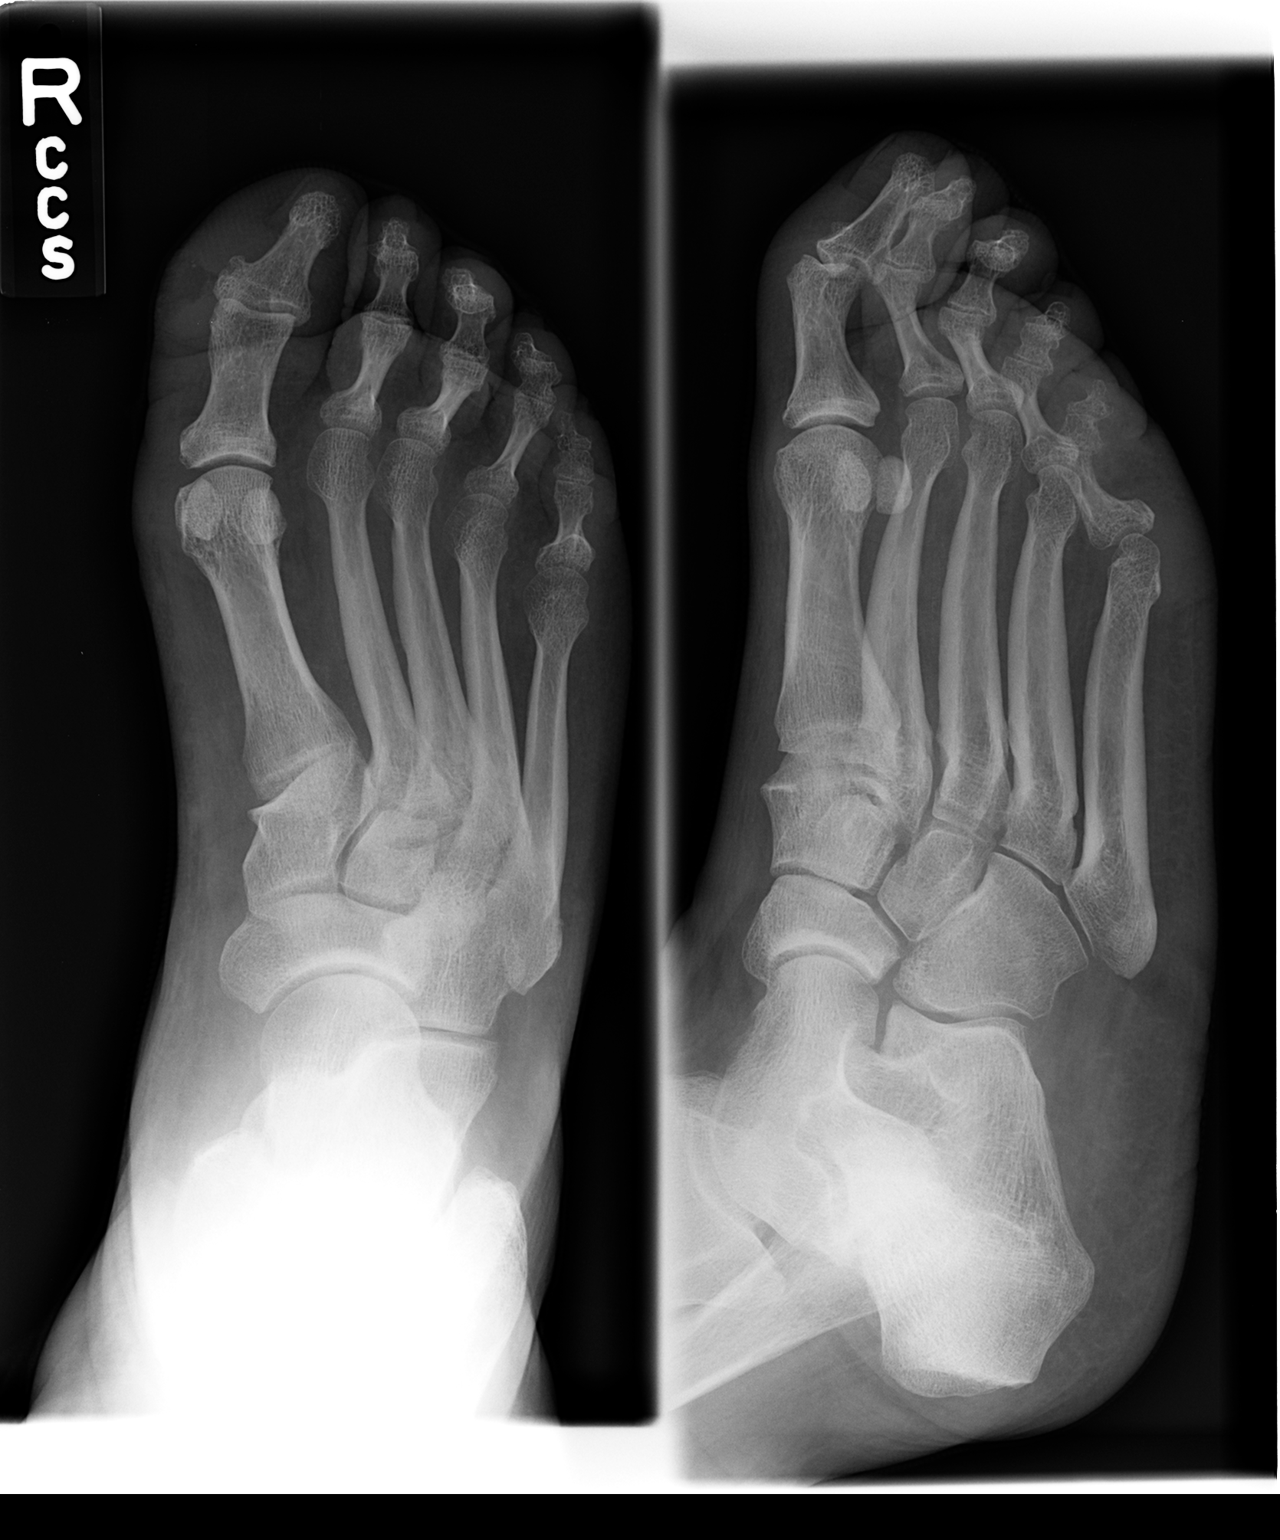

[view not recorded (2 of 2)]
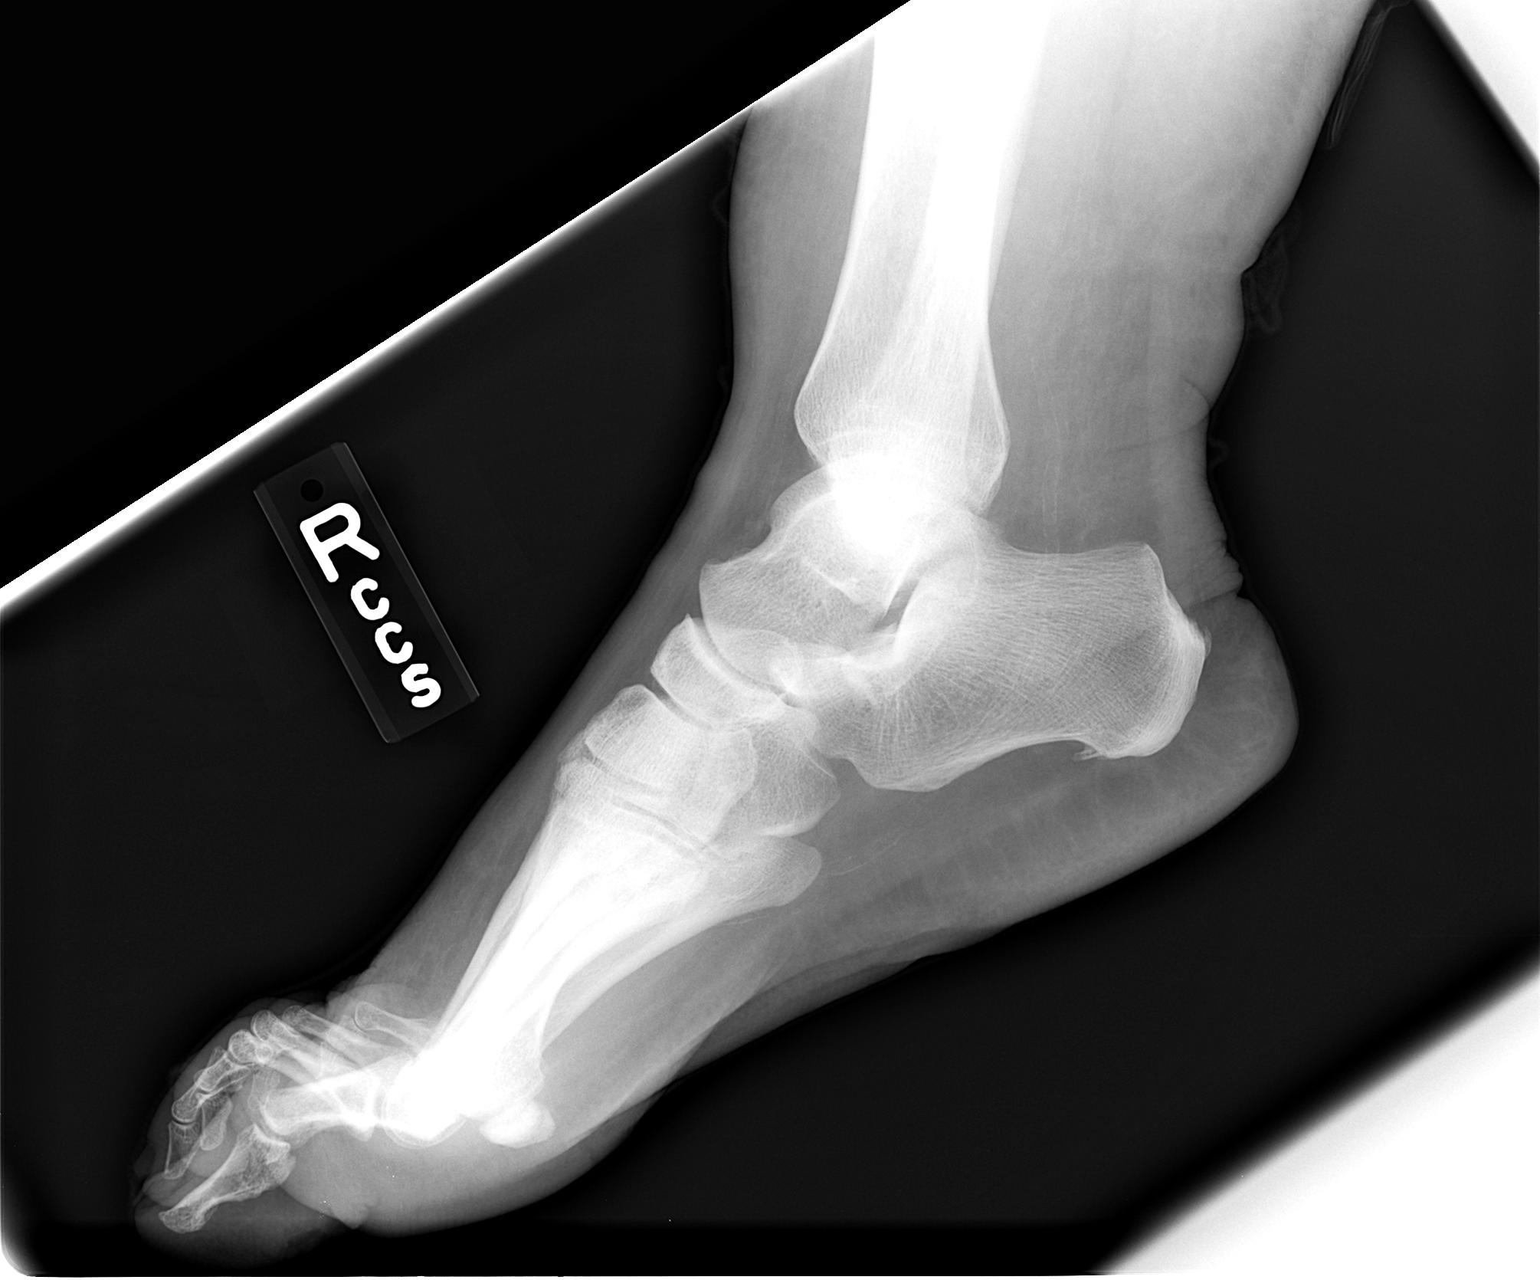

[2 of 2 positions shown; findings below may reference images not displayed]

FINDINGS: Comparison to 11/02/03.
 Soft tissue defect/ulcer along the medial great toe noted.  No evidence of underlying bony defect to suggest osteomyelitis.  Mild degenerative changes in the mid foot noted.
IMPRESSION: Great toe soft tissue ulcer without evidence of osteomyelitis.

## 2006-06-04 ENCOUNTER — Emergency Department (HOSPITAL_COMMUNITY): Admission: EM | Admit: 2006-06-04 | Discharge: 2006-06-04 | Payer: Self-pay | Admitting: *Deleted

## 2006-06-15 ENCOUNTER — Ambulatory Visit (HOSPITAL_COMMUNITY): Admission: RE | Admit: 2006-06-15 | Discharge: 2006-06-15 | Payer: Self-pay | Admitting: Nephrology

## 2006-06-29 IMAGING — CR DG FOOT COMPLETE 3+V*R*
3 series · 3 of 3 positions shown · non-contrast
Comparison: none

CLINICAL DATA: Diabetes, plantar ulcer. 
 3 VIEWS OF THE RIGHT FOOT: 
 Marked soft tissue swelling surrounding the great toe.  No definite bony abnormality.  Abscess not excluded compared with great toe films from 5 weeks ago.

[view not recorded (1 of 3)]
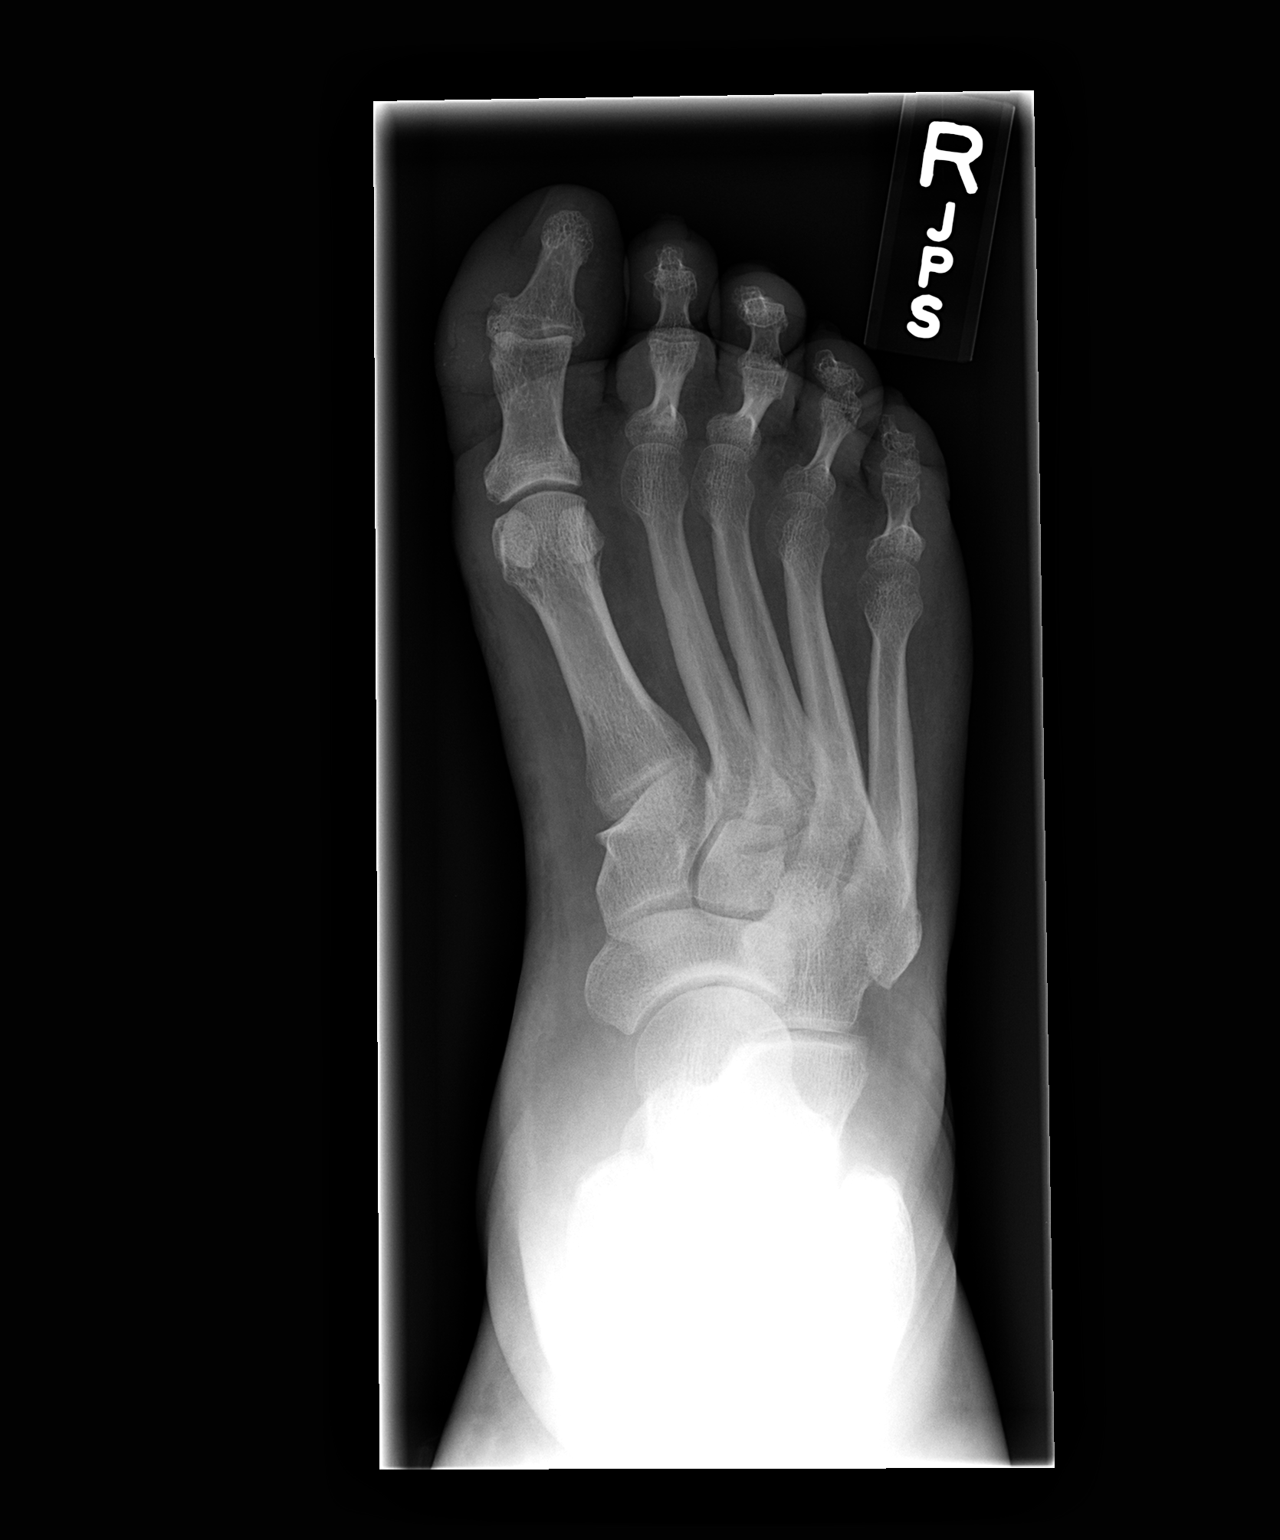

[view not recorded (2 of 3)]
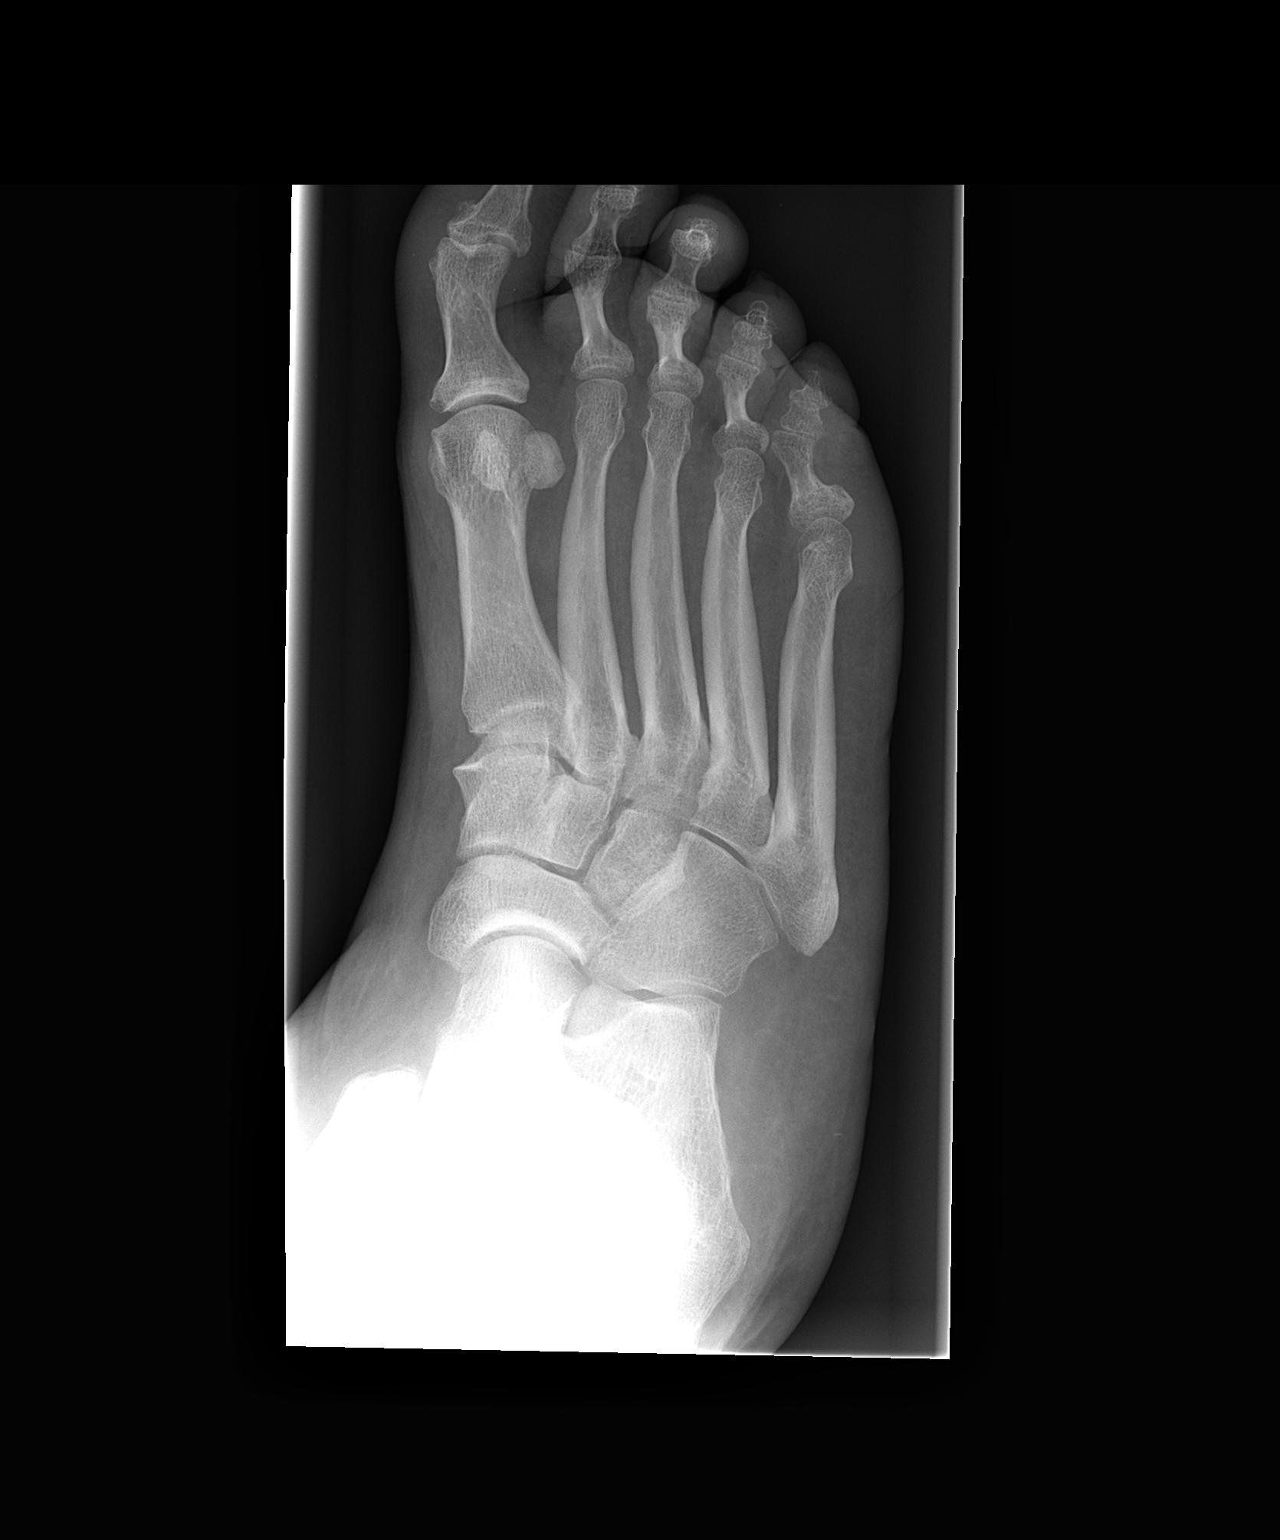

[view not recorded (3 of 3)]
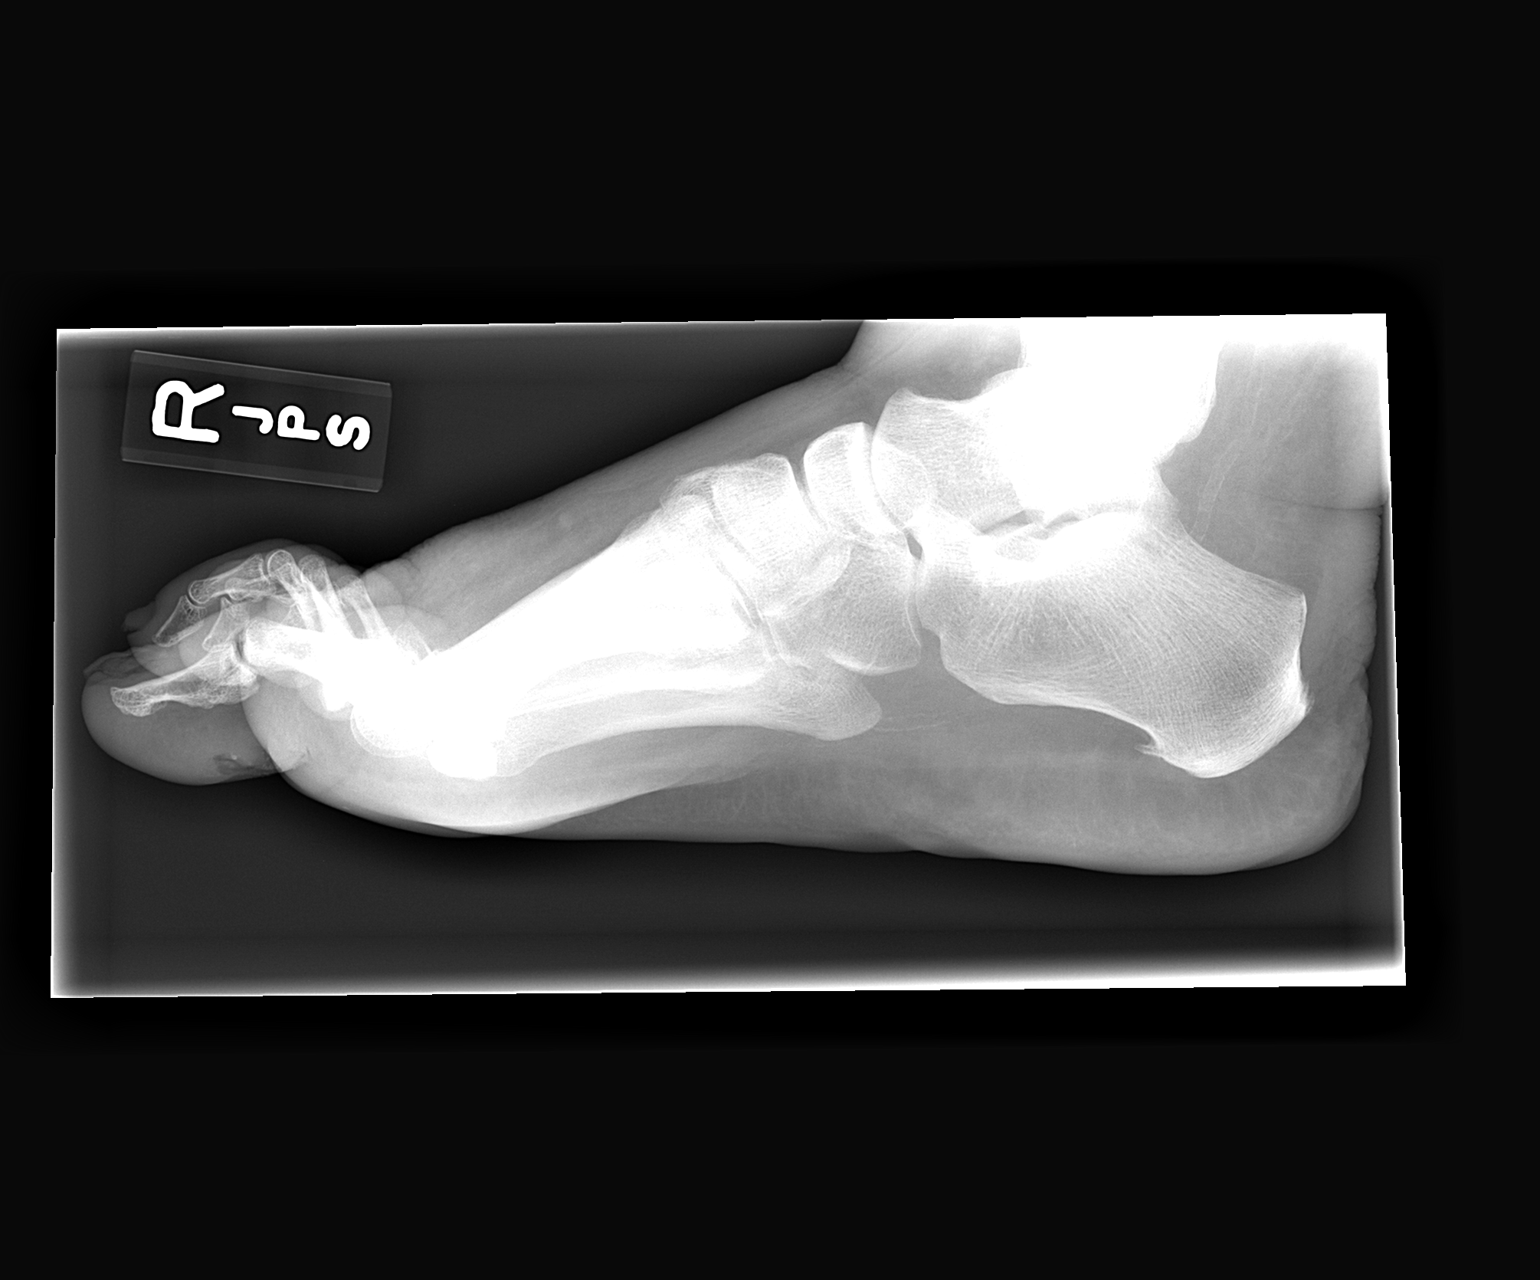

[3 of 3 positions shown; findings below may reference images not displayed]

IMPRESSION: Marked soft tissue swelling great toe without definite bony abnormality.  Osteomyelitis / abscess not excluded.

## 2006-06-29 IMAGING — MR MR [PERSON_NAME] LOW WO/W CM*R*
5 of 9 series · 22 of 40 positions shown · IV contrast (omniscan)
Comparison: none

CLINICAL DATA: 38-year-old ulcer on the plantar aspect of the great to for one month.
 MRI RIGHT FOOT WITHOUT AND WITH CONTRAST:
 Multiplanar multisequence imaging was performed pre and post adminsitraiton of 15 cc of Omniscan.
 There is mild diffuse soft tissue swelling/edema involving the subcutaneous tissues of the foot, which may represent mild cellulitis.  I don?t see any signal abnormality or worrisome enhancement and the bony structures suggest osteomyelitis.  Specifically, I don?t see any abnormality involving the great toe.  There is plantar edema and what appears to be a small soft tissue defect at the graet toe, but I don?t see a focal soft tissue abscess or osteomyelitis.  No findings to suggest septic arthritis.  Flexor and extensor tendons are intact.

[Series 3: axial 3 plane · axial · 5.0mm · 0.94mm/px · z∈[-73,+69]mm · 4 of 20 slices shown]
[im 1/20]
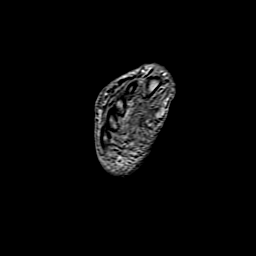
[im 7/20]
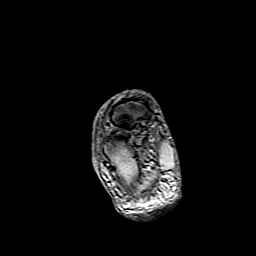
[im 13/20]
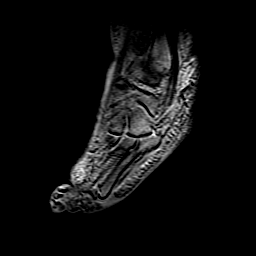
[im 20/20]
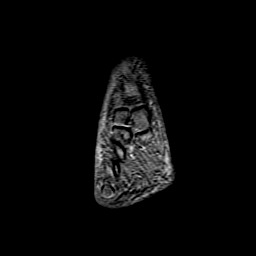

[Series 5: (id) · oblique · 4.0mm · 0.35mm/px · 5 of 28 slices shown (1 of 2)]
[im 1/28]
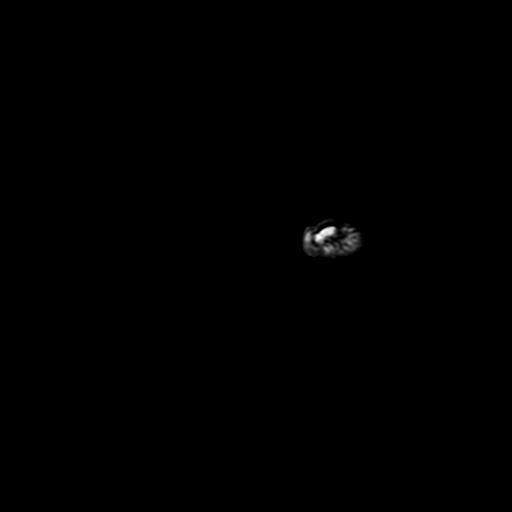
[im 7/28]
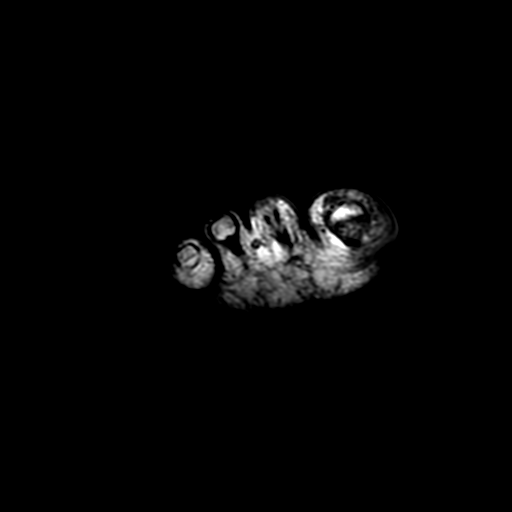
[im 14/28]
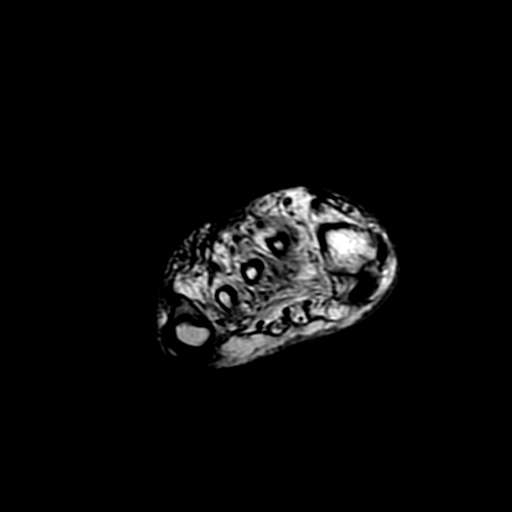
[im 21/28]
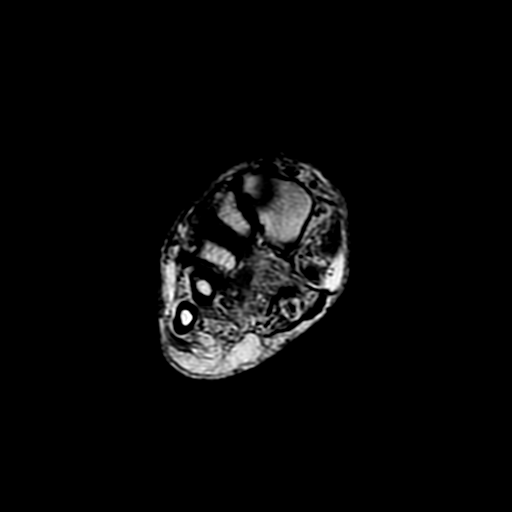
[im 28/28]
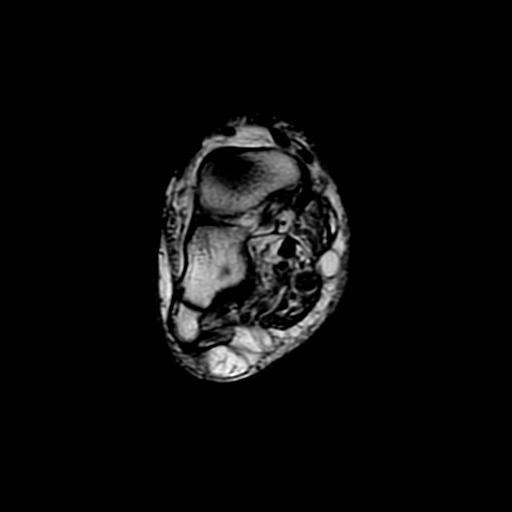

[Series 6: T1 · sagittal · 4.0mm · 0.39mm/px · 3 of 20 slices shown]
[im 1/20]
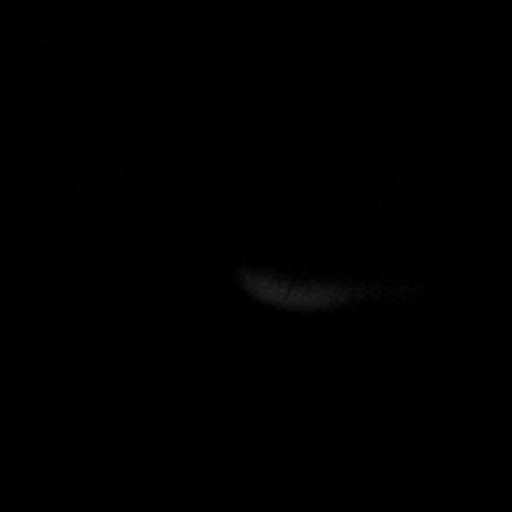
[im 10/20]
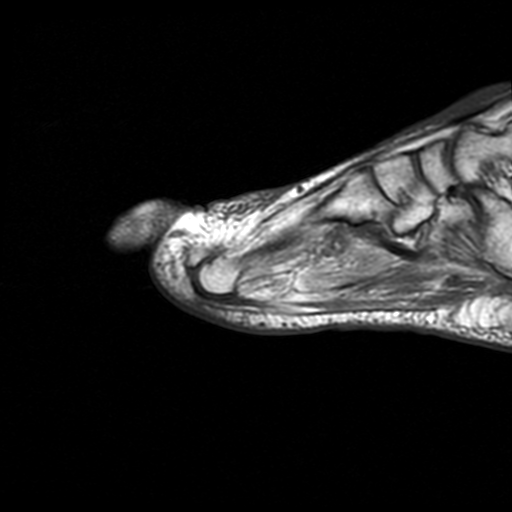
[im 20/20]
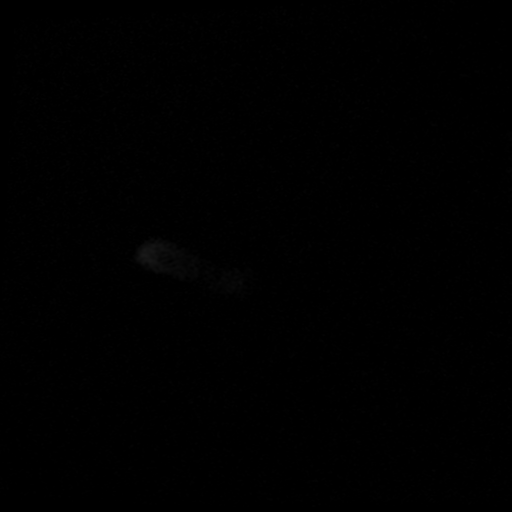

[Series 7: (id)-fat sat · sagittal · 4.0mm · 0.39mm/px · 7 of 40 slices shown]
[im 1/40]
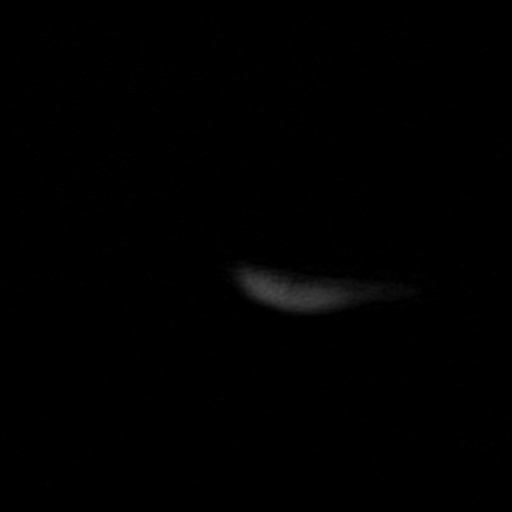
[im 7/40]
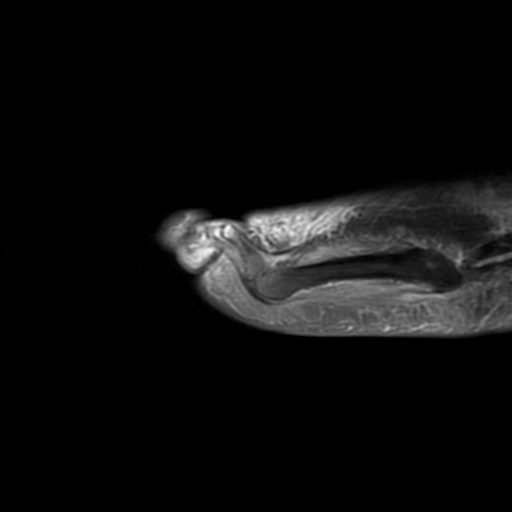
[im 14/40]
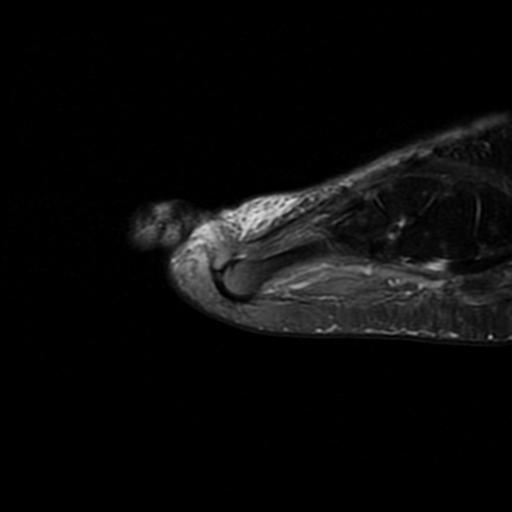
[im 20/40]
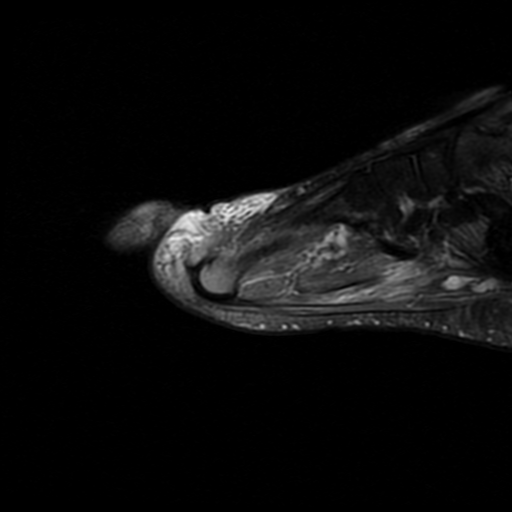
[im 27/40]
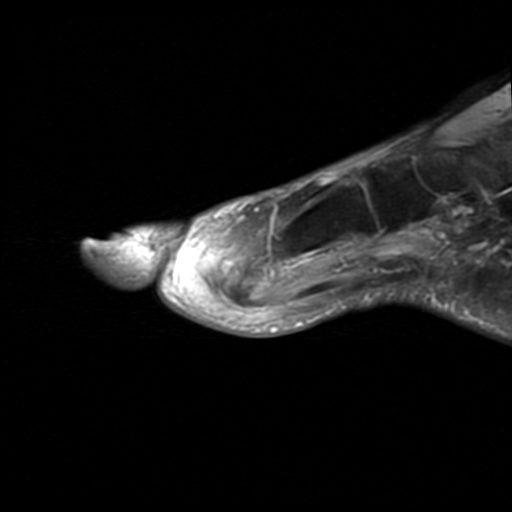
[im 33/40]
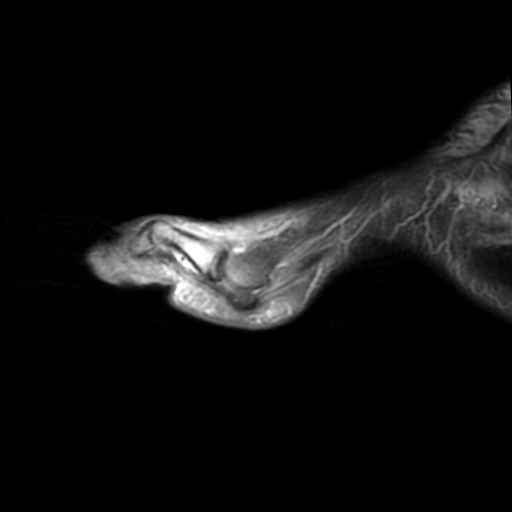
[im 40/40]
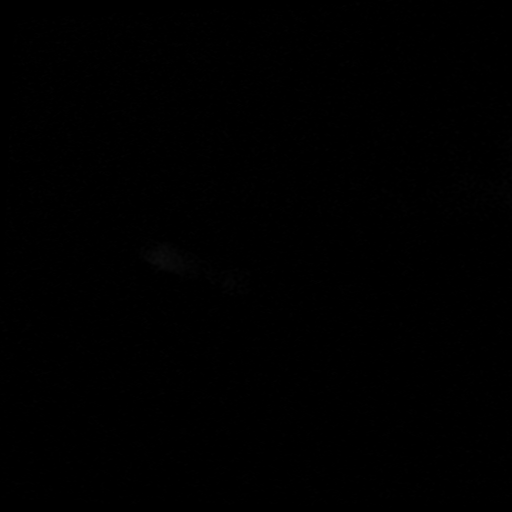

[Series 8: (id) · oblique · 4.0mm · 0.35mm/px · 3 of 56 slices shown (2 of 2)]
[im 1/56]
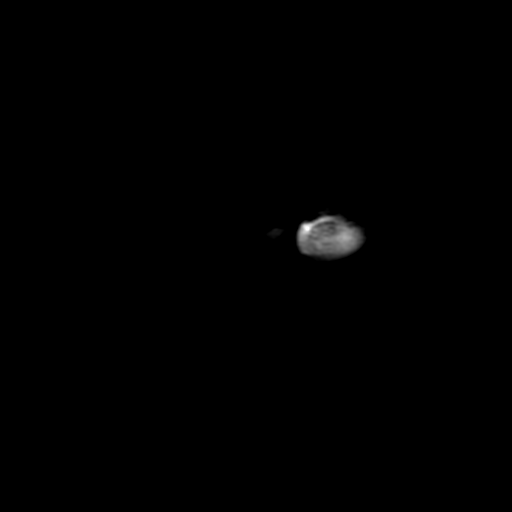
[im 7/56]
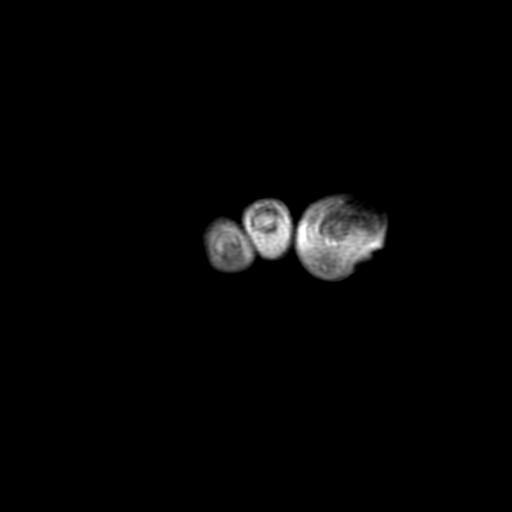
[im 14/56]
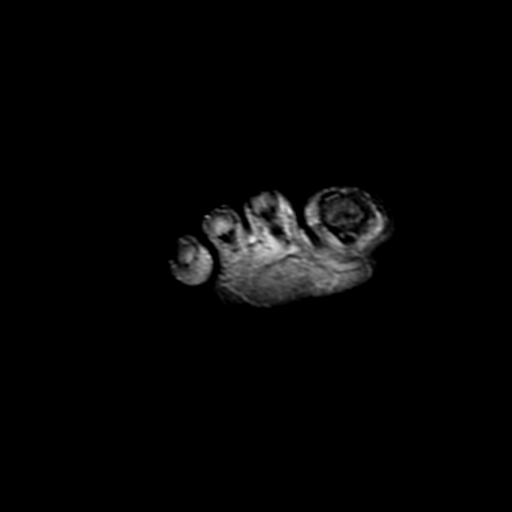

[22 of 40 positions shown; findings below may reference images not displayed]

IMPRESSION: Soft tissue swelling/edema and what appears to be an ulcer on the medial plantar aspect of the great toe.  No focal soft tissue abscess and no findings to suggest osteomyelitis or septic arthritis.

## 2006-06-29 IMAGING — CR DG CHEST 1V PORT
1 series · 1 of 1 positions shown · non-contrast
Comparison: 07/04/04.

CLINICAL DATA: Cold chills.  Diabetic. 
 PORTABLE CHEST - 1 VIEW:

[view not recorded]
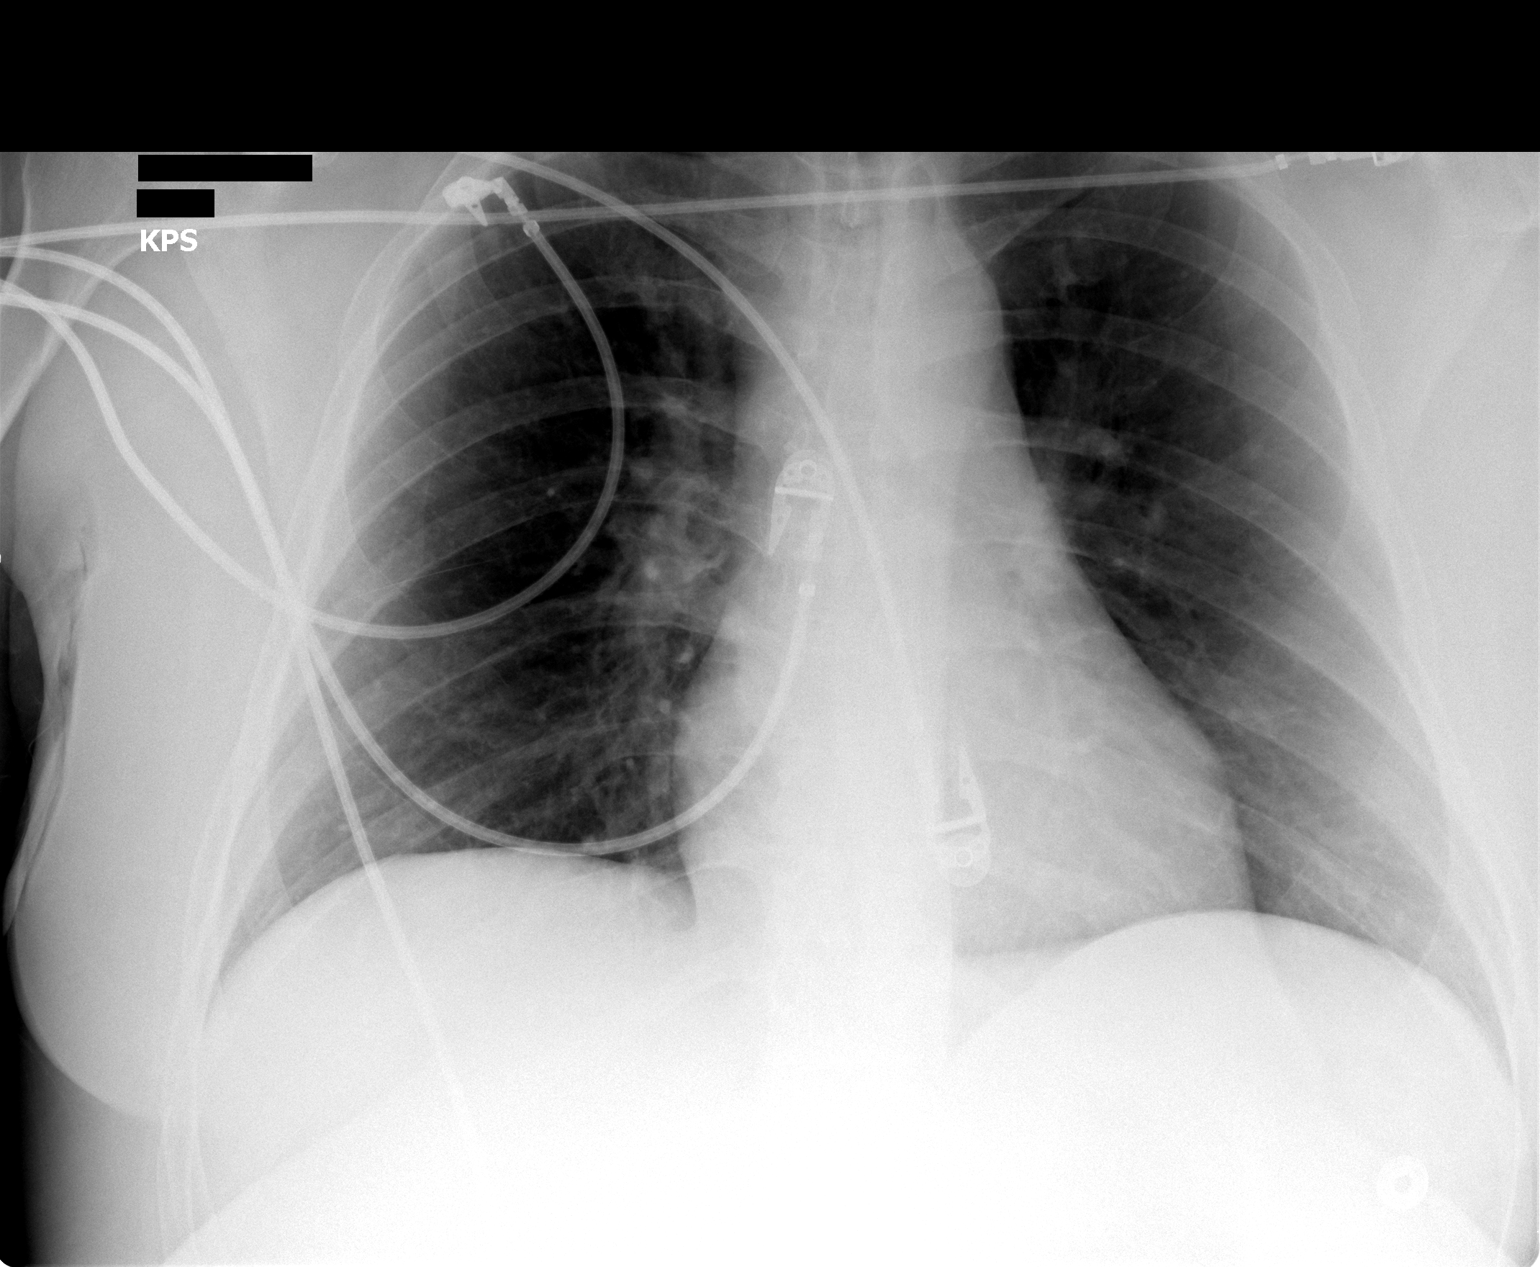

[1 of 1 positions shown; findings below may reference images not displayed]

The heart size and mediastinal contours are within normal limits. The lungs are clear.
IMPRESSION: No acute disease.

## 2006-11-01 IMAGING — CR DG FOOT COMPLETE 3+V*R*
3 series · 3 of 3 positions shown · non-contrast
Comparison: 09/10/04.

CLINICAL DATA: Right second and third toe pain.  Open wound on the bottom of both toes.
 RIGHT FOOT ? 3 VIEW:

[t foot oblique right]
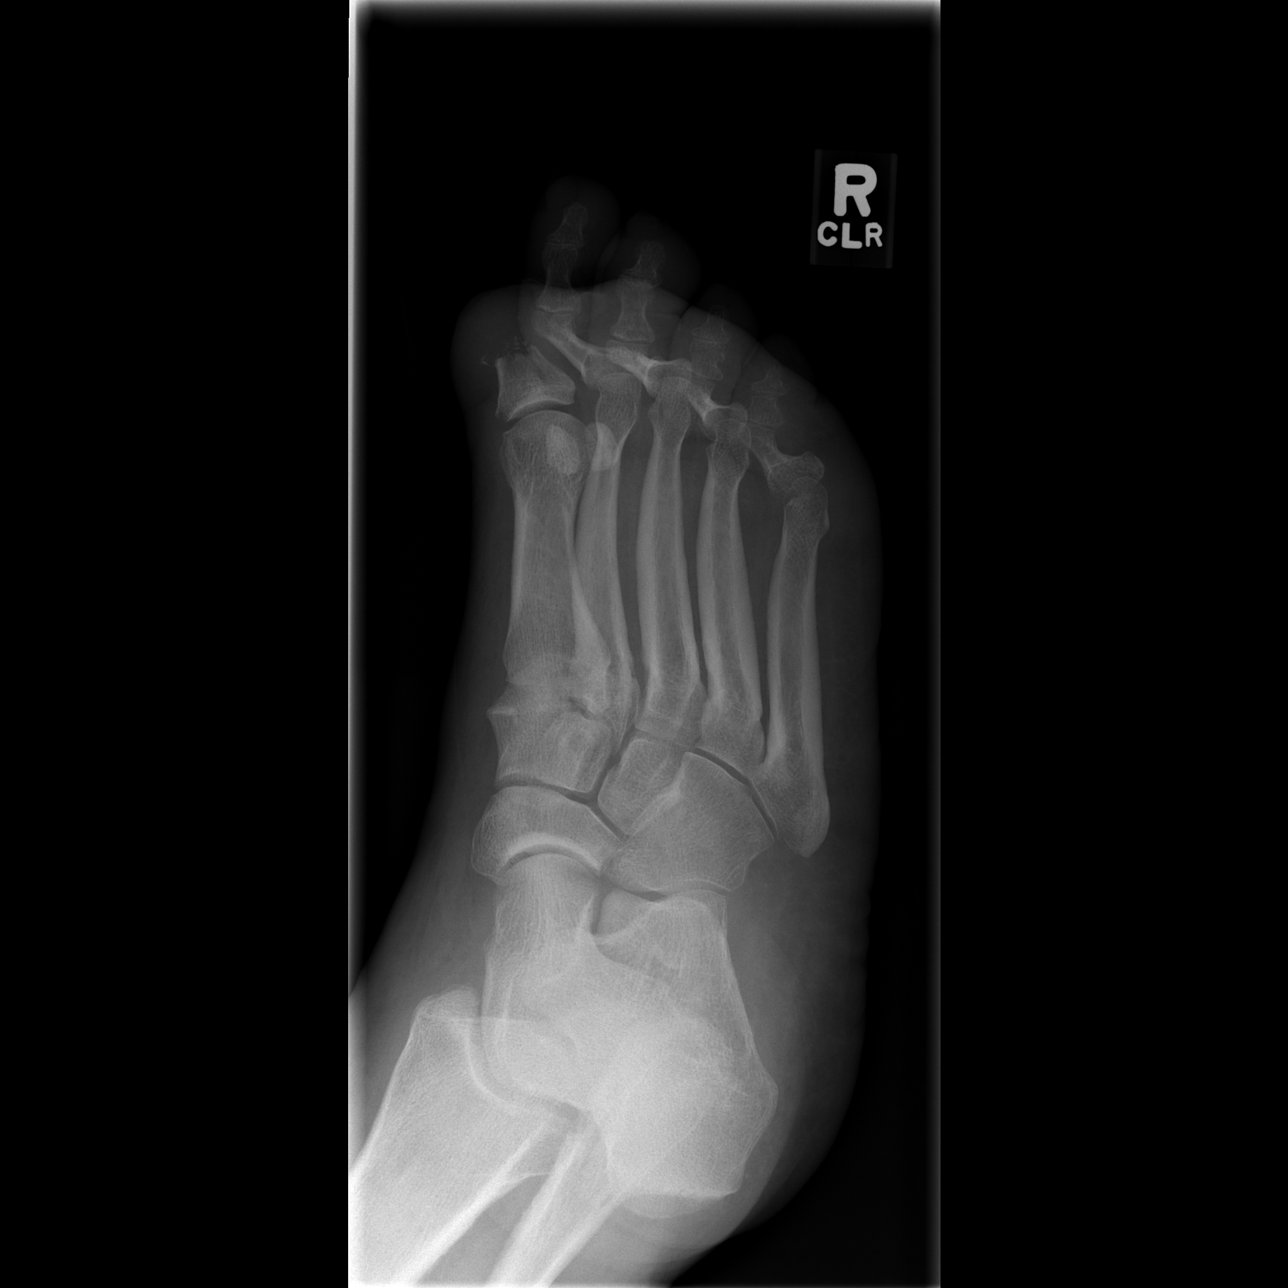

[t foot ap right]
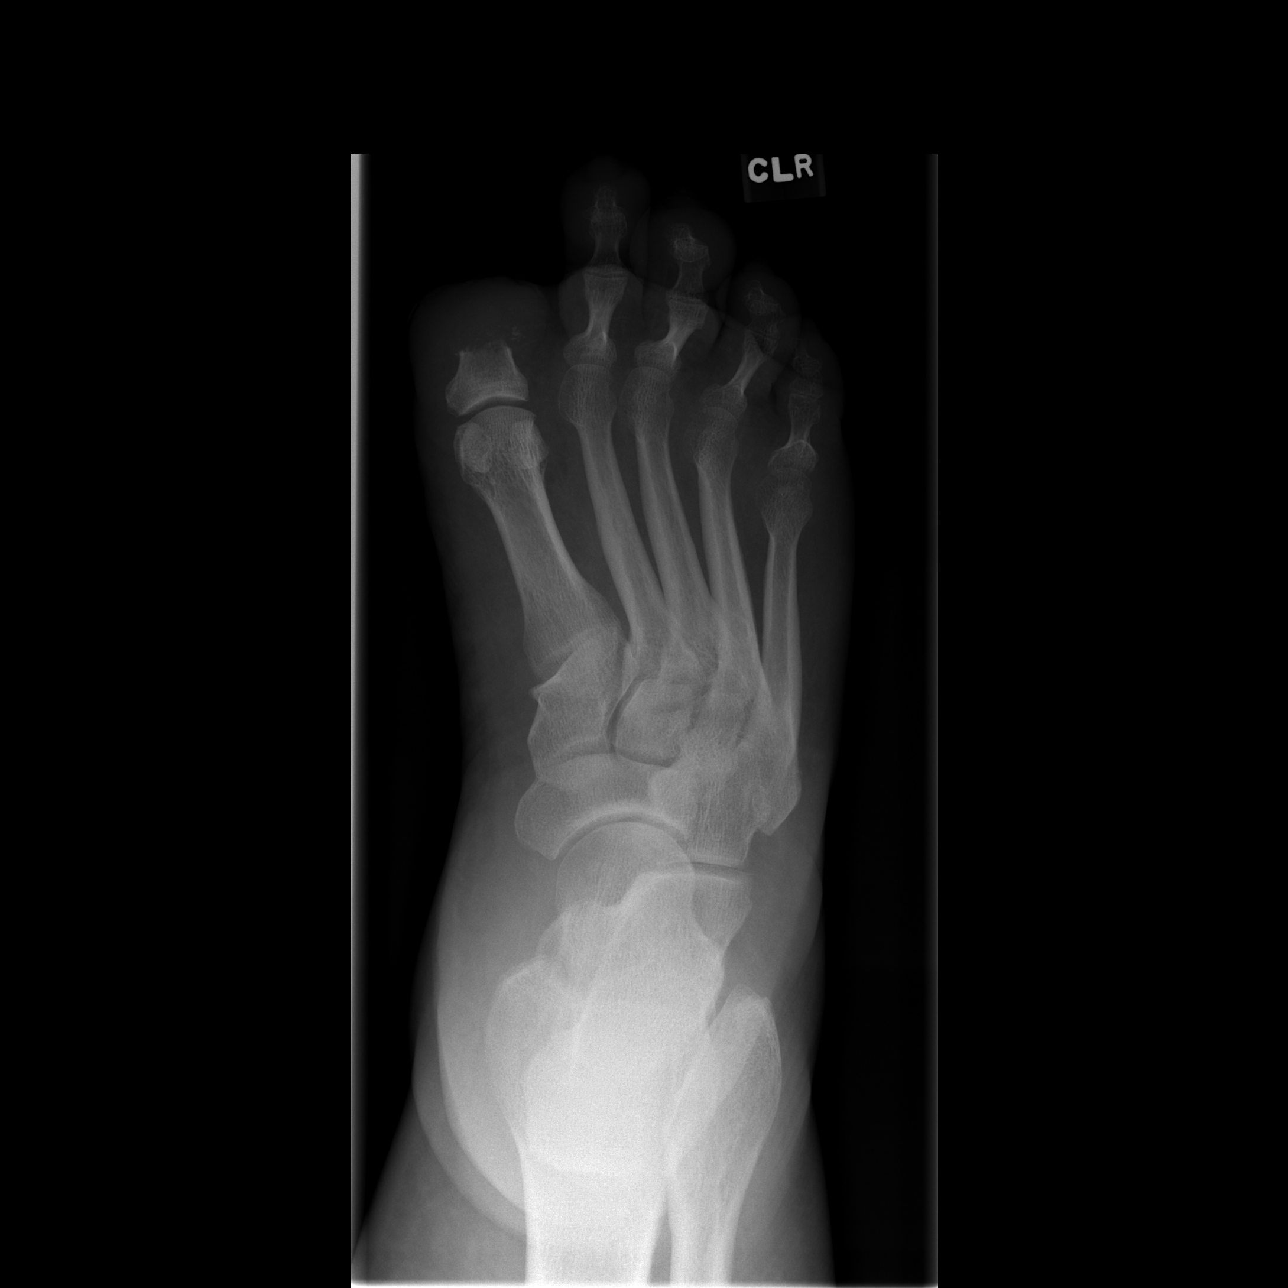

[t foot lat right]
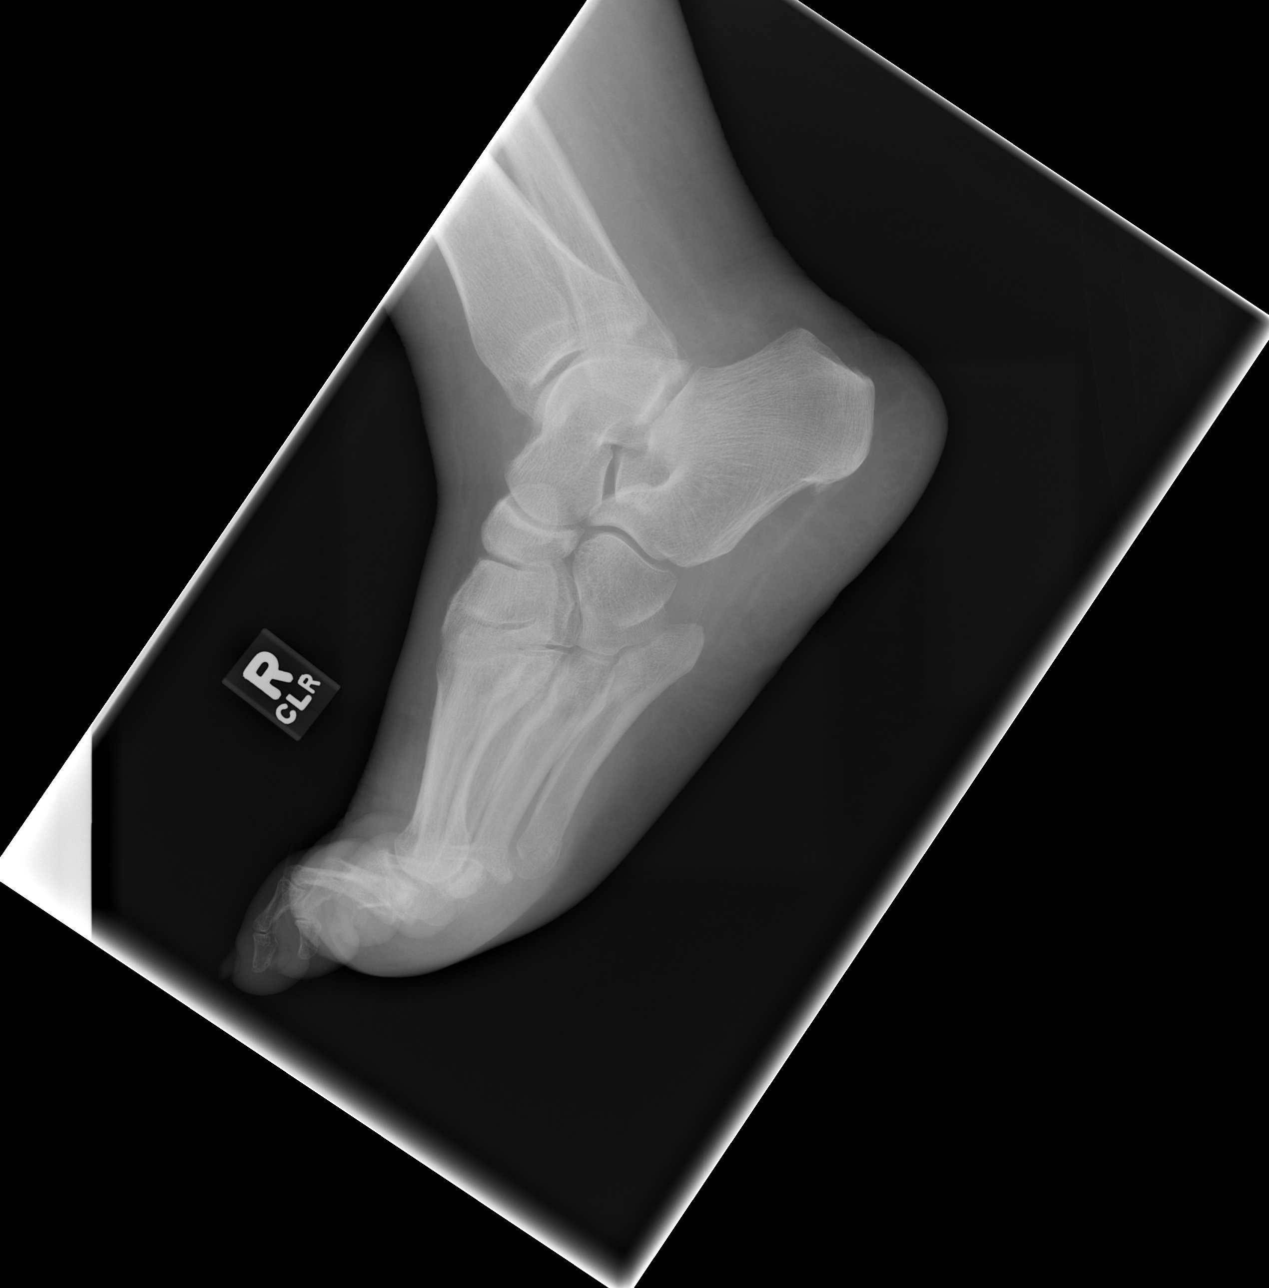

[3 of 3 positions shown; findings below may reference images not displayed]

The patient has undergone interval great toe amputation.  No radiographic evidence for osteomyelitis in the second or third toes, although MRI would be a more sensitive means to evaluate.  No acute fracture or dislocation.
IMPRESSION: 1.  Interval great toe amputation.
 2.  No radiographic evidence for osteomyelitis.

## 2006-11-01 IMAGING — CR DG ANKLE COMPLETE 3+V*R*
3 series · 3 of 3 positions shown · non-contrast
Comparison: none
 Three view exam of the right ankle shows no acute fracture or dislocation.

CLINICAL DATA: pain and swelling
 RIGHT ANKLE- 3 VIEWS:

[t ankle joint ap right]
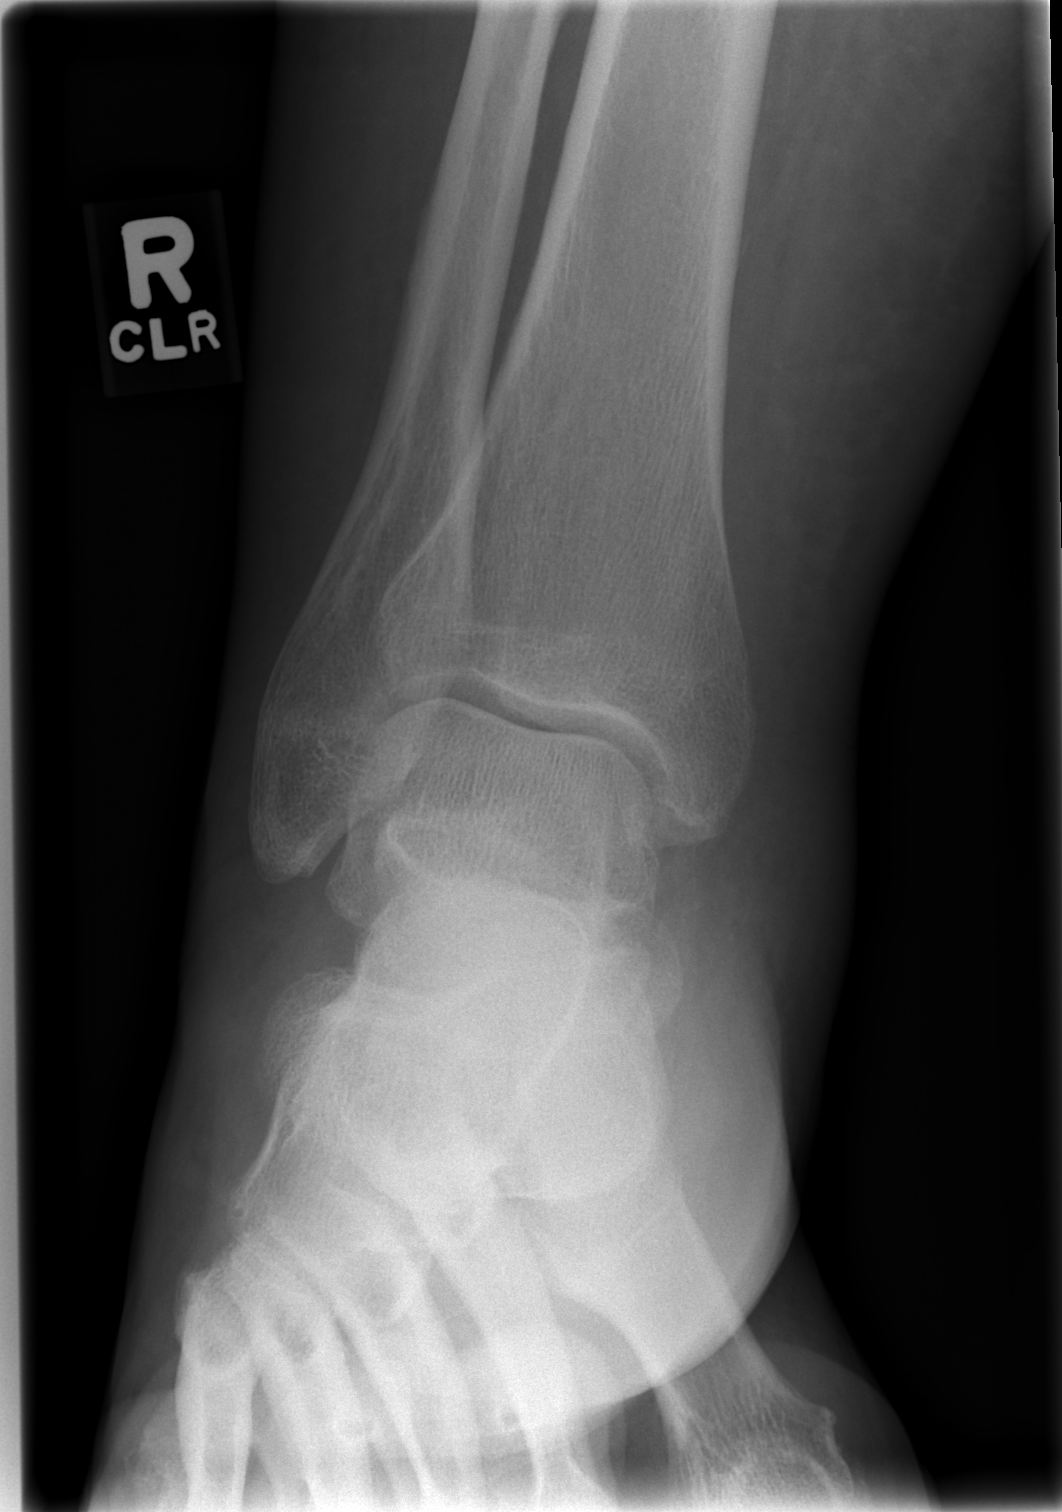

[t ankle joint oblique right]
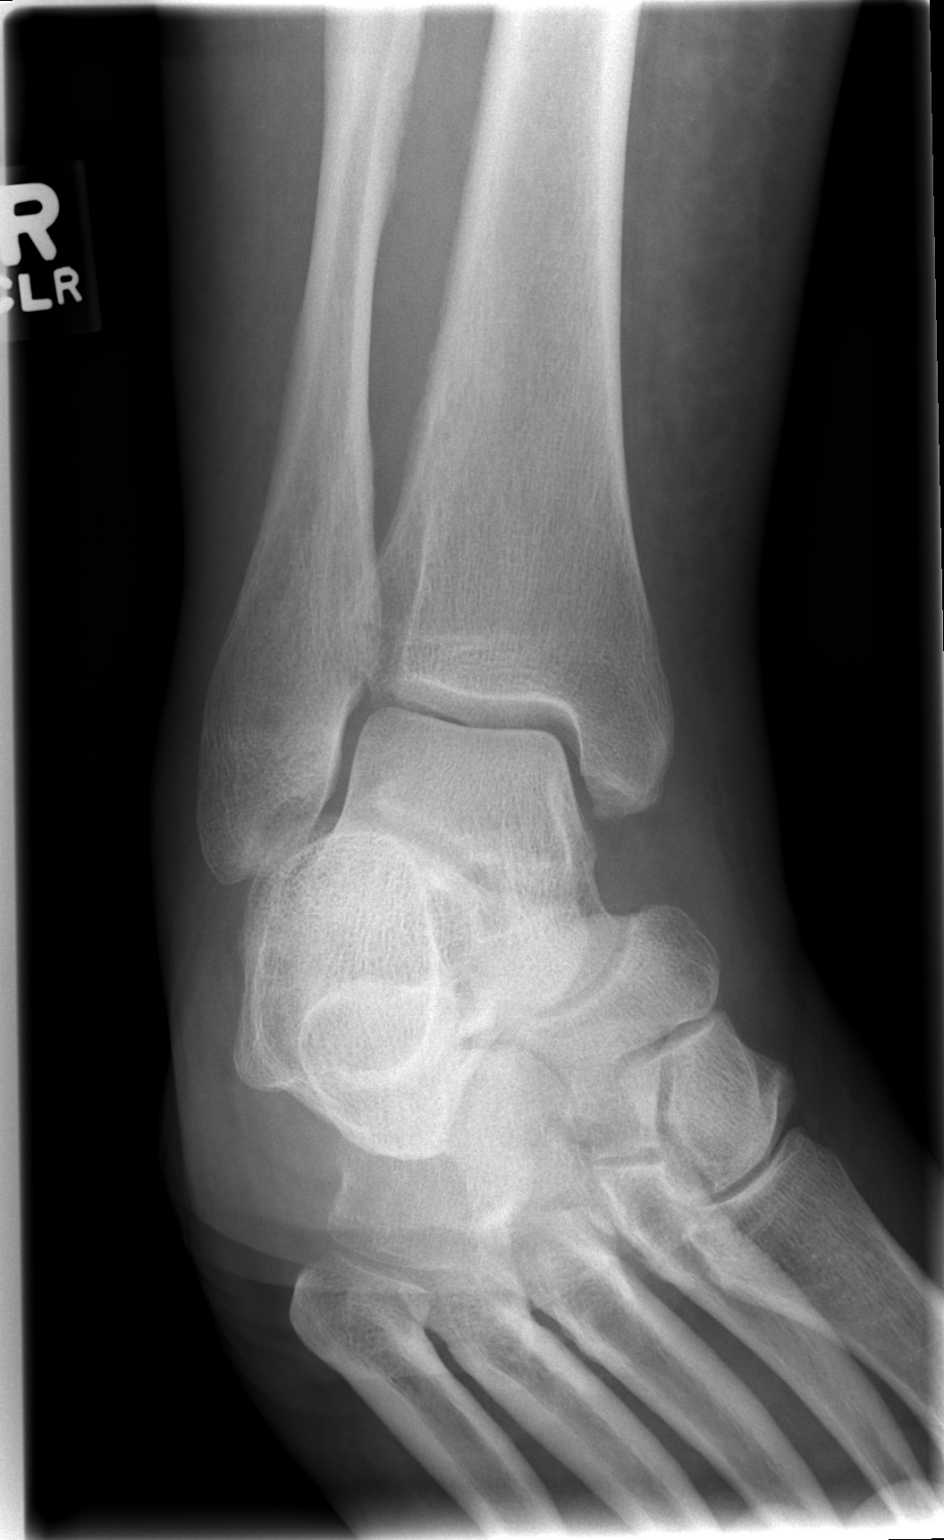

[t ankle joint lat right]
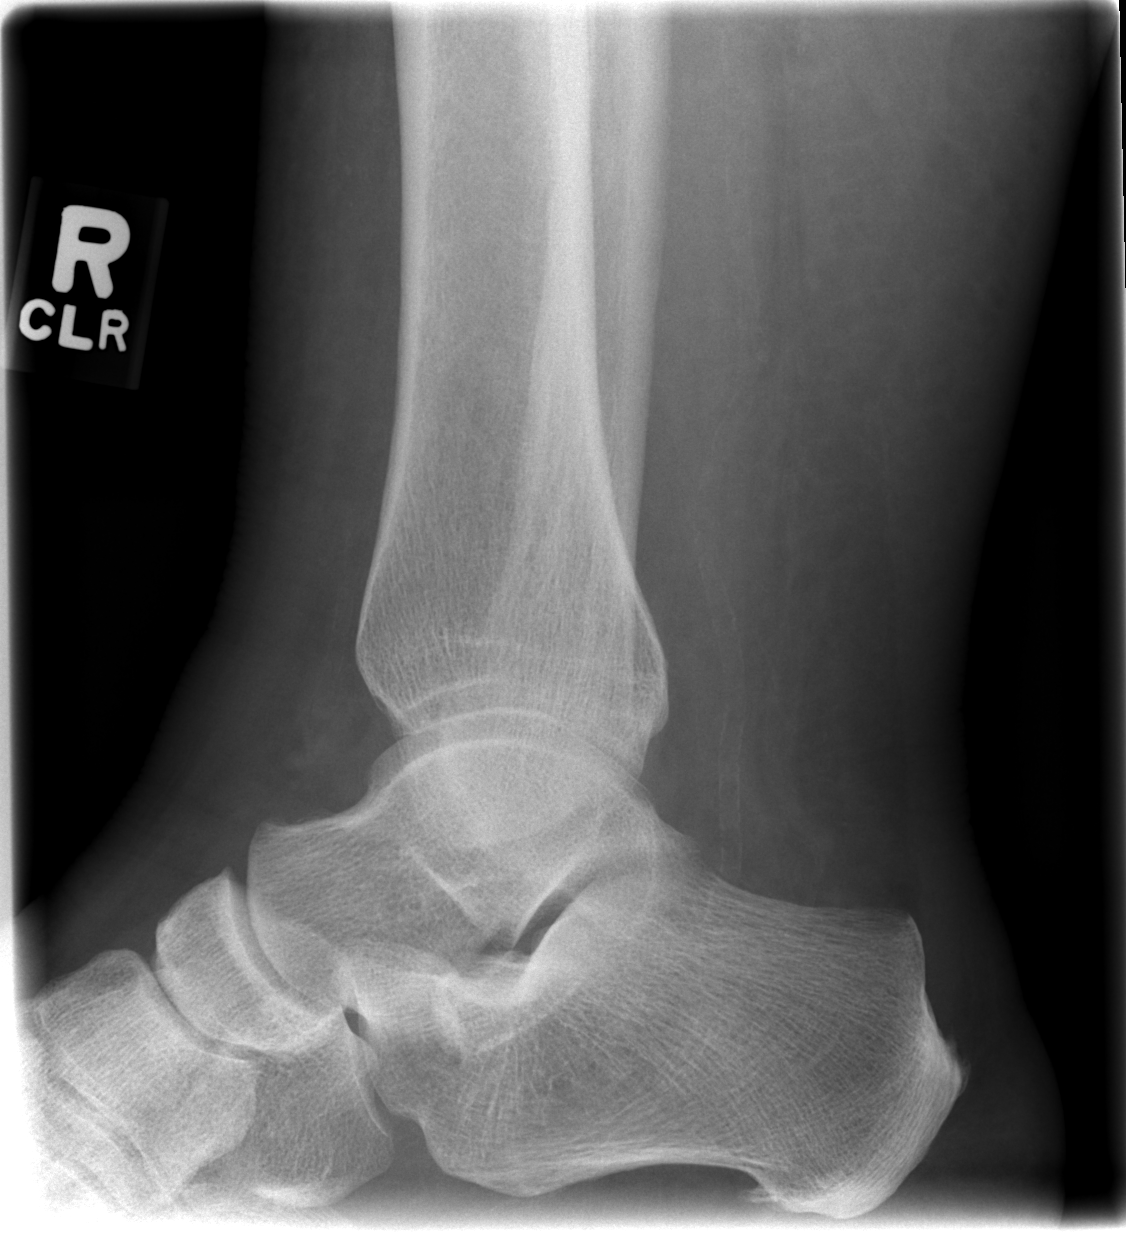

[3 of 3 positions shown; findings below may reference images not displayed]

There is no bony erosion to suggest infection.  Vascular calcification is consistent with diabetes.
IMPRESSION: No acute bony abnormality.

## 2006-11-02 IMAGING — CR DG CHEST 2V
2 series · 2 of 2 positions shown · non-contrast
Comparison: 09/10/04.

CLINICAL DATA: Cellulitis of the foot.
 CHEST - 2 VIEW:

[w chest pa]
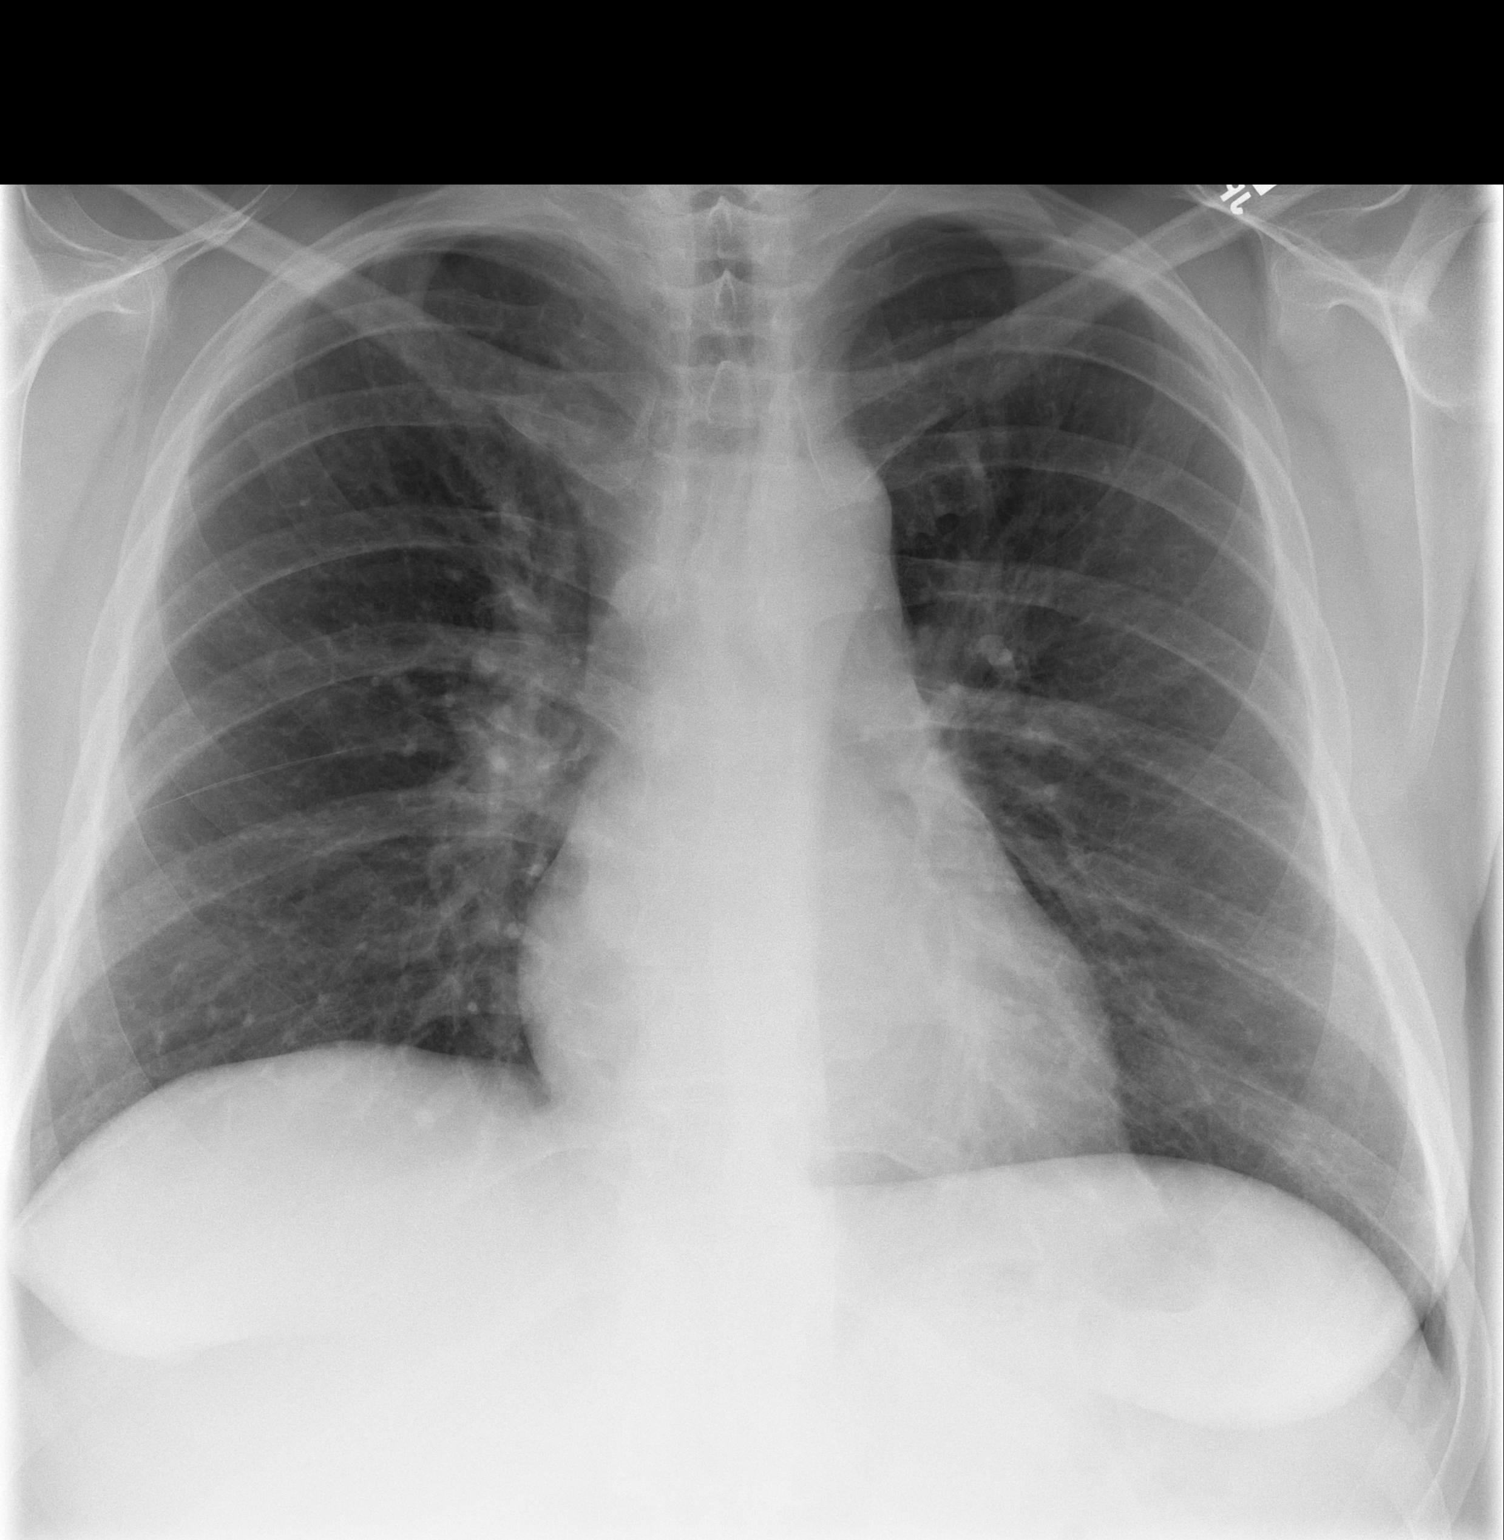

[w chest lat]
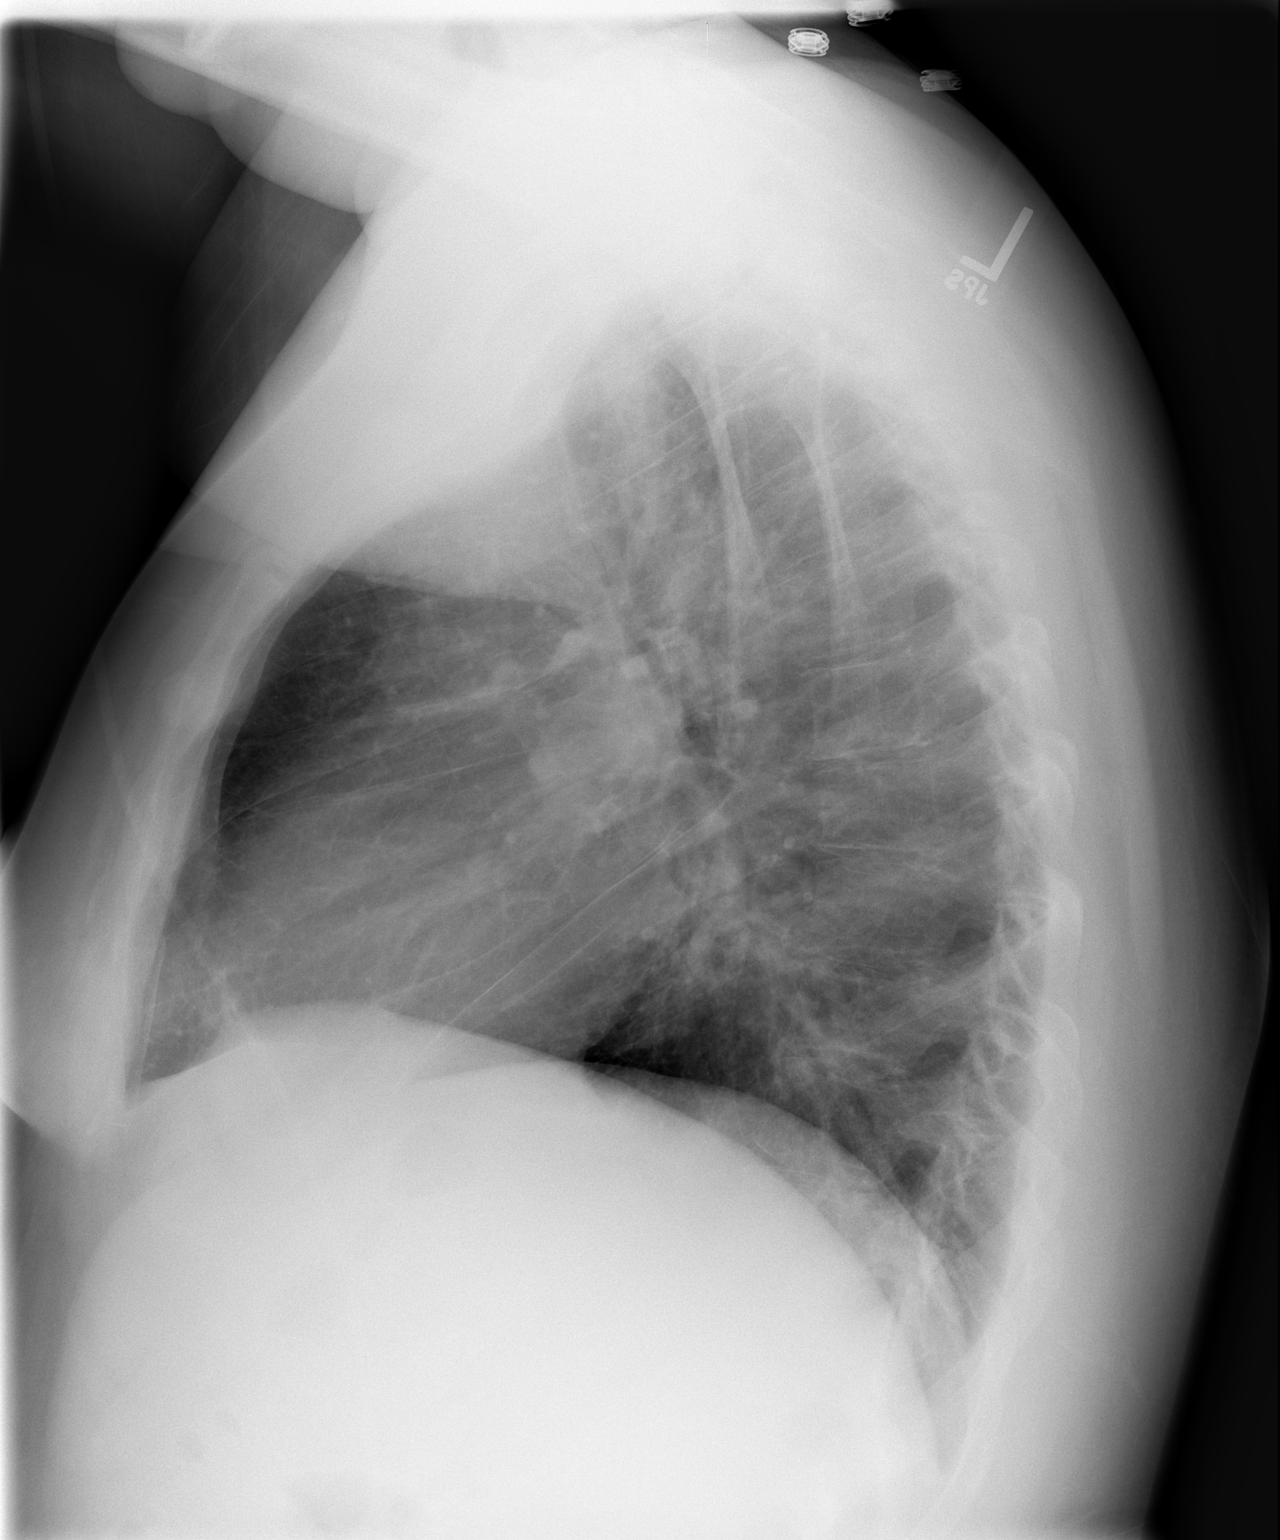

[2 of 2 positions shown; findings below may reference images not displayed]

The heart size and mediastinal contours are normal.  The lungs are clear.  The visualized skeleton is unremarkable.
IMPRESSION: No active disease.

## 2006-11-02 IMAGING — MR MR [PERSON_NAME] LOW JT WO/W CM*R*
6 of 12 series · 20 of 40 positions shown · IV contrast (No    OMNISCAN)
Comparison: none

CLINICAL DATA: Cellulitis of the right foot.  Soft tissue ulceration of the third toe laterally.  Previous partial amputation of the right great toe.
MRI OF THE RIGHT FOOT WITH AND WITHOUT GADOLINIUM ? 01/14/05: 
Scans were performed before and after intravenous administration of 20 cc of IV Omniscan. 
The scan demonstrates that there is enhancement of the majority of the remaining portion of the base of the proximal phalangeal bone of the great toe with some fluid collections in the soft tissues adjacent to the stump of the great toe.  This could be all postsurgical but if the patient has evidence of discrete infection involving the remnant of the first toe, the possibility of osteomyelitis and a small abscess of the first toe should be considered.  The bony structures of the second and third toes and of the remainder of the foot demonstrate no discrete evidence suggestive of osteomyelitis.  There is soft tissue thickening and edema of the subcutaneous tissues of the third toe.  There is some subcutaneous edema in the distal lower leg and along the dorsum of the foot.

[Series 1: axial 3 plane · axial · 5.0mm · 0.94mm/px · z∈[-22,+120]mm · 2 of 21 slices shown]
[im 1/21]
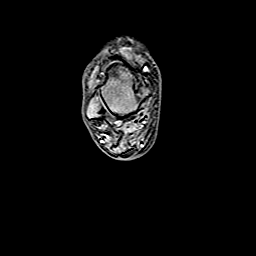
[im 21/21]
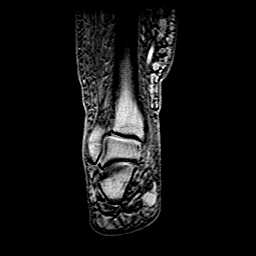

[Series 2: (id) · coronal · 4.0mm · 0.35mm/px · 3 of 28 slices shown (1 of 4)]
[im 1/28]
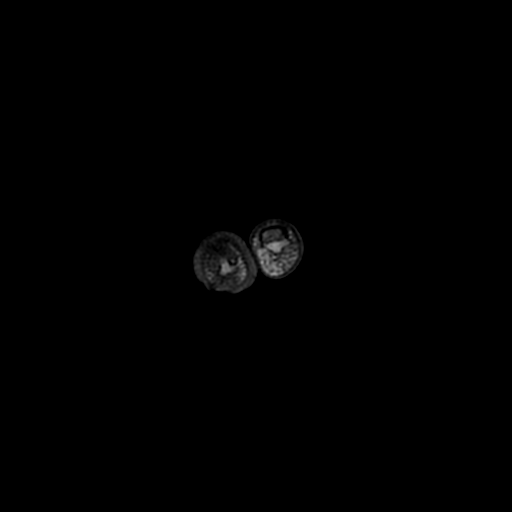
[im 14/28]
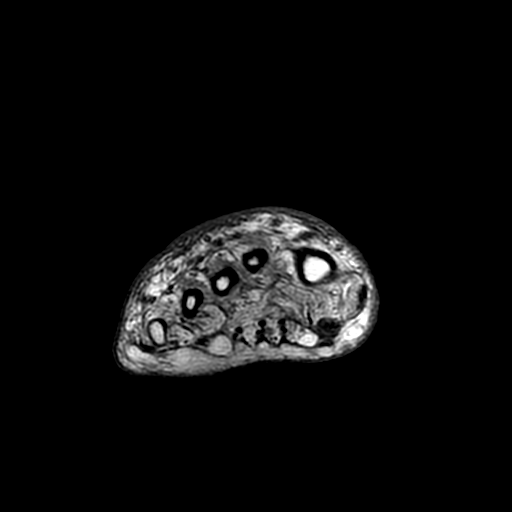
[im 28/28]
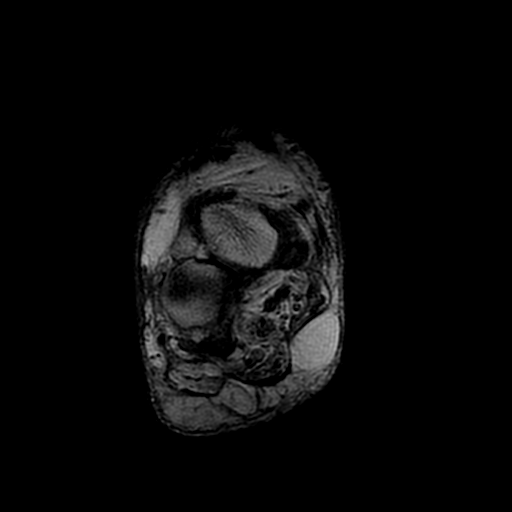

[Series 3: (id) · coronal · 4.0mm · 0.35mm/px · 7 of 56 slices shown (2 of 4)]
[im 1/56]
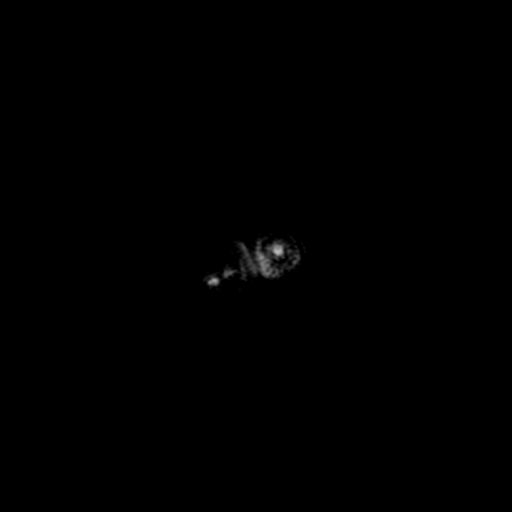
[im 10/56]
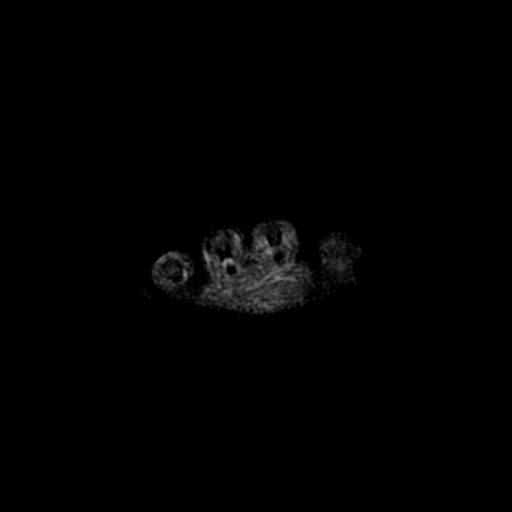
[im 19/56]
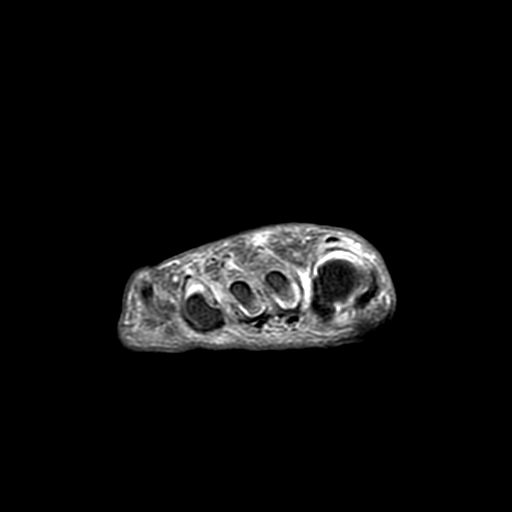
[im 28/56]
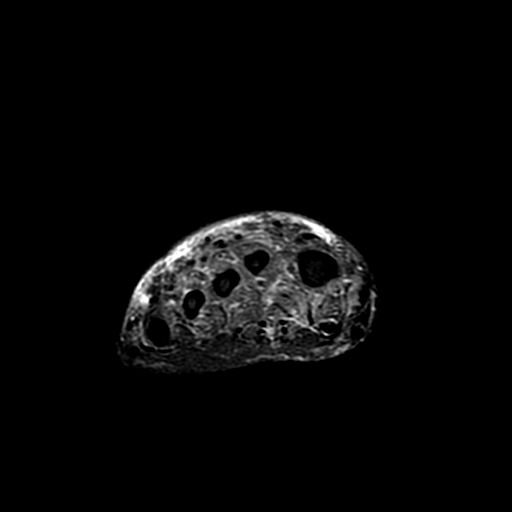
[im 37/56]
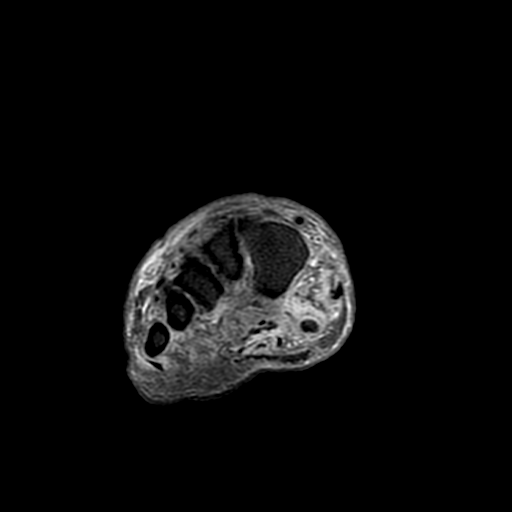
[im 46/56]
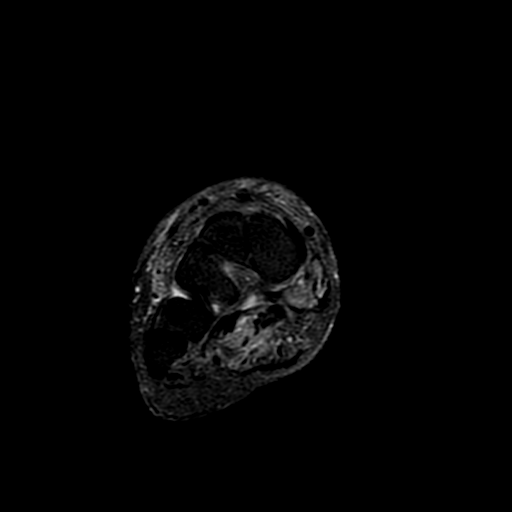
[im 56/56]
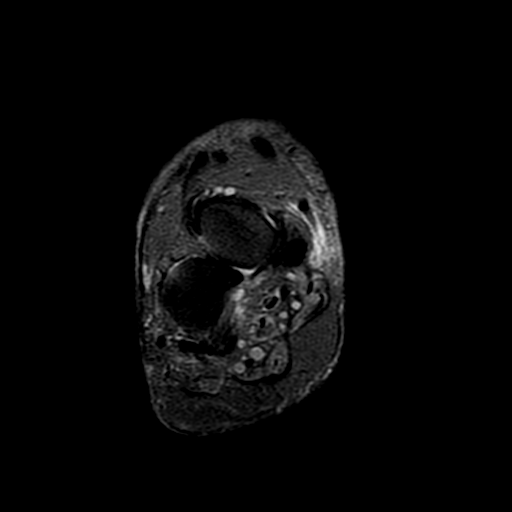

[Series 6: (id) · oblique · 4.0mm · 0.47mm/px · 2 of 17 slices shown (3 of 4)]
[im 1/17]
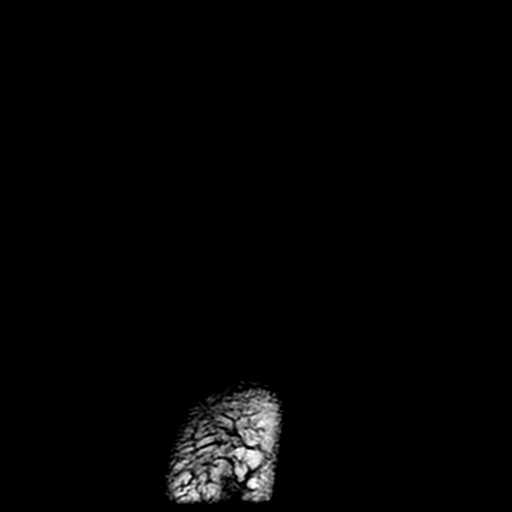
[im 17/17]
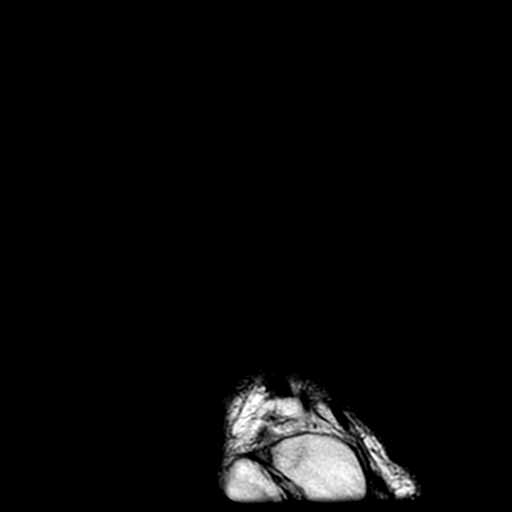

[Series 7: (id) · oblique · 4.0mm · 0.47mm/px · 3 of 34 slices shown (4 of 4)]
[im 1/34]
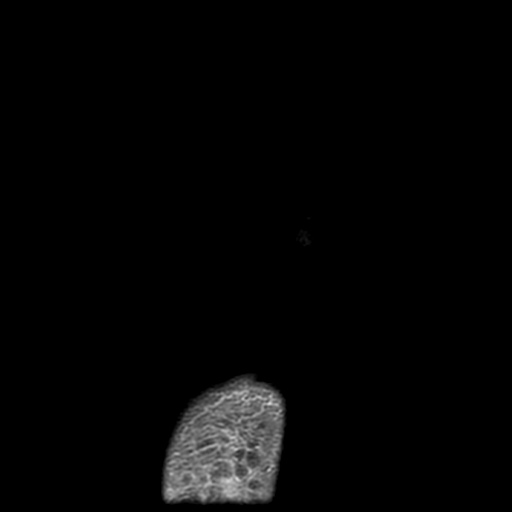
[im 12/34]
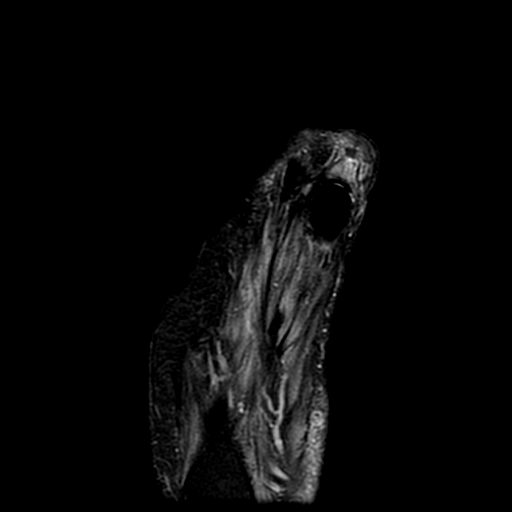
[im 23/34]
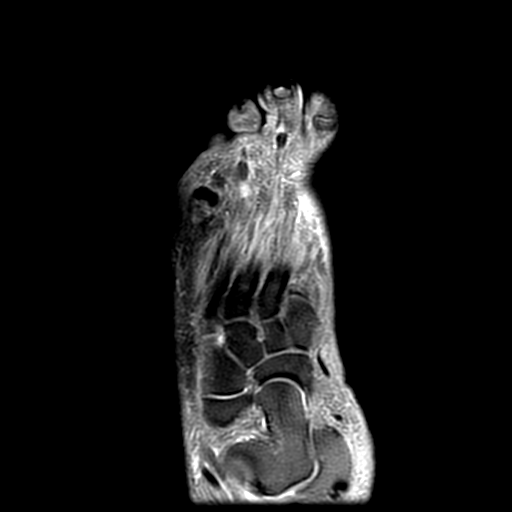

[Series 9: T1 · sagittal · 4.0mm · 0.51mm/px · 3 of 21 slices shown]
[im 1/21]
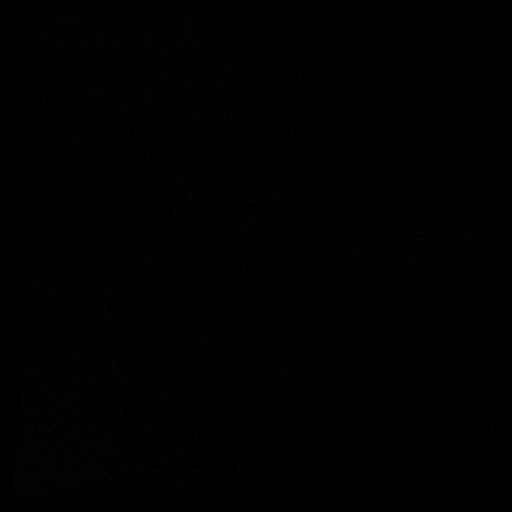
[im 11/21]
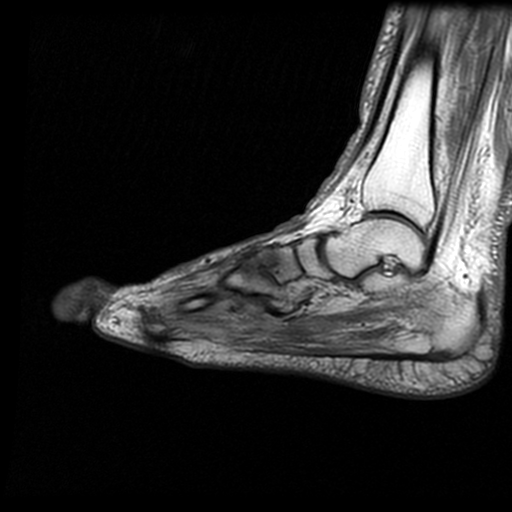
[im 21/21]
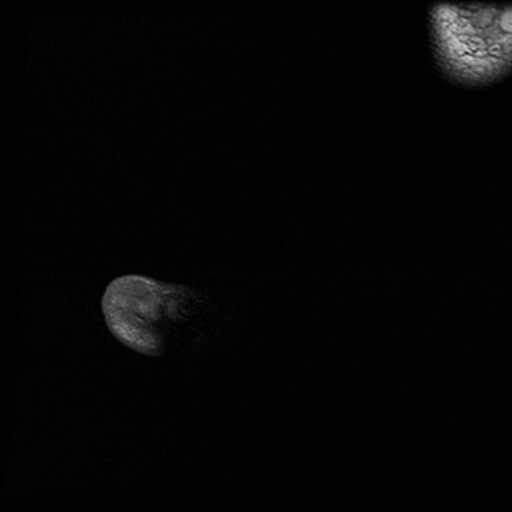

[20 of 40 positions shown; findings below may reference images not displayed]

IMPRESSION: 1.  No evidence of osteomyelitis involving the second, third, fourth, or fifth toes.
2.  Extensive edema and abnormal enhancement of the remnant of the proximal phalangeal bone of the great toe with tiny fluid collections adjacent to the distal aspect of the bone remnant which could represent pus.  Does the patient have evidence of cellulitis at that site?  If so, then abnormal signal in the remnant of the proximal phalangeal bone could represent osteomyelitis. 
3.  No other findings suggestive of osteomyelitis.

## 2006-11-16 ENCOUNTER — Ambulatory Visit (HOSPITAL_COMMUNITY): Admission: RE | Admit: 2006-11-16 | Discharge: 2006-11-16 | Payer: Self-pay | Admitting: Nephrology

## 2006-11-16 ENCOUNTER — Encounter: Admission: RE | Admit: 2006-11-16 | Discharge: 2006-11-16 | Payer: Self-pay | Admitting: Nephrology

## 2006-11-22 IMAGING — CR DG FOOT COMPLETE 3+V*R*
3 series · 3 of 3 positions shown · non-contrast
Comparison: 01/13/2005.

CLINICAL DATA: Pain.
 RIGHT FOOT - THREE VIEW:

[view not recorded (1 of 3)]
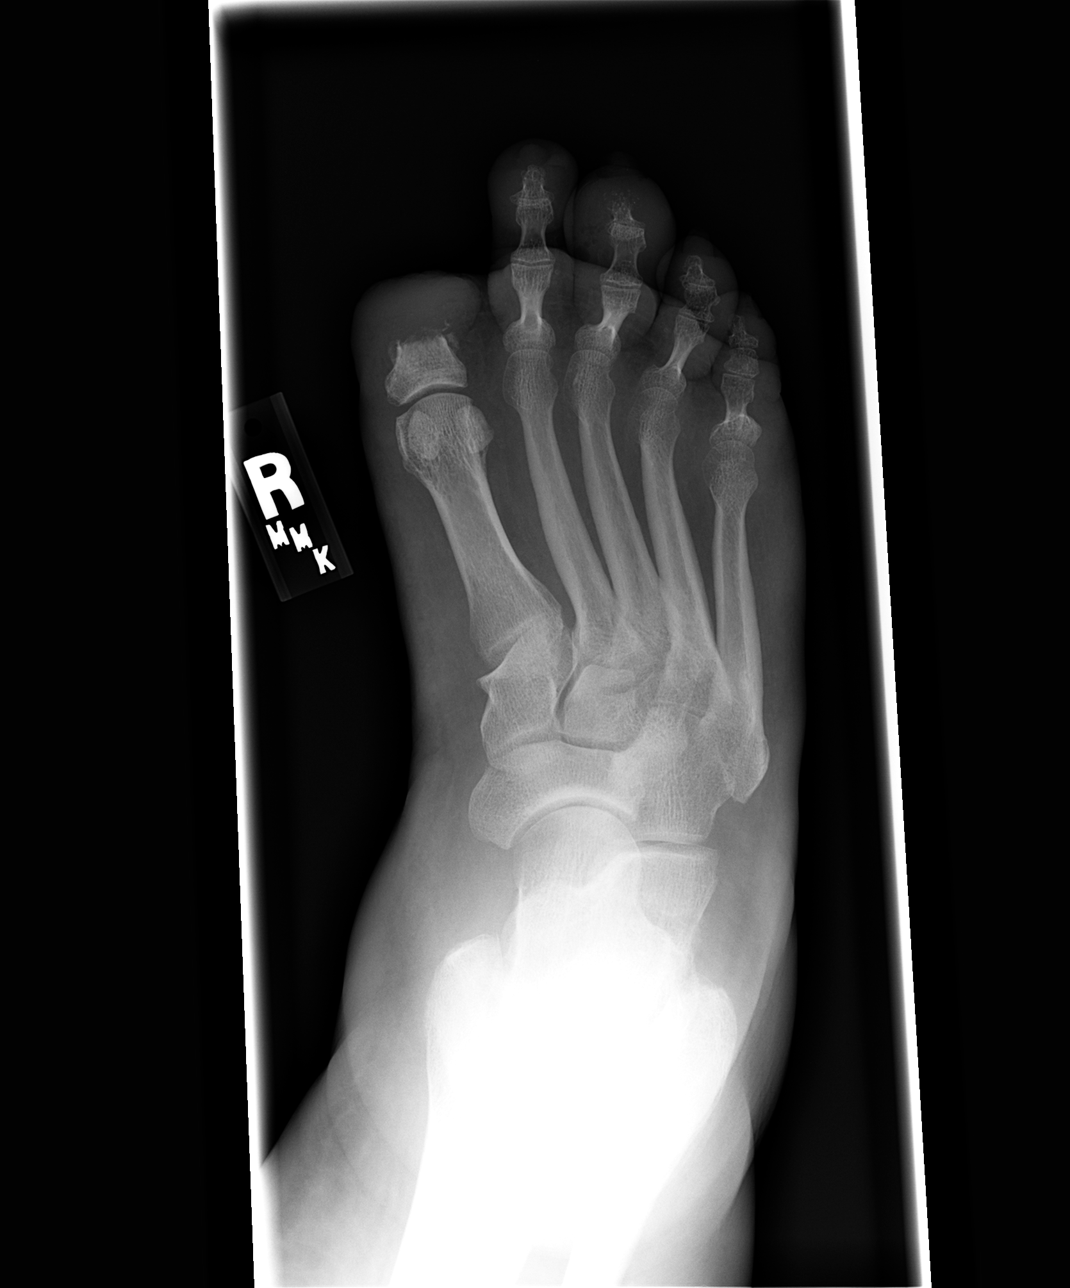

[view not recorded (2 of 3)]
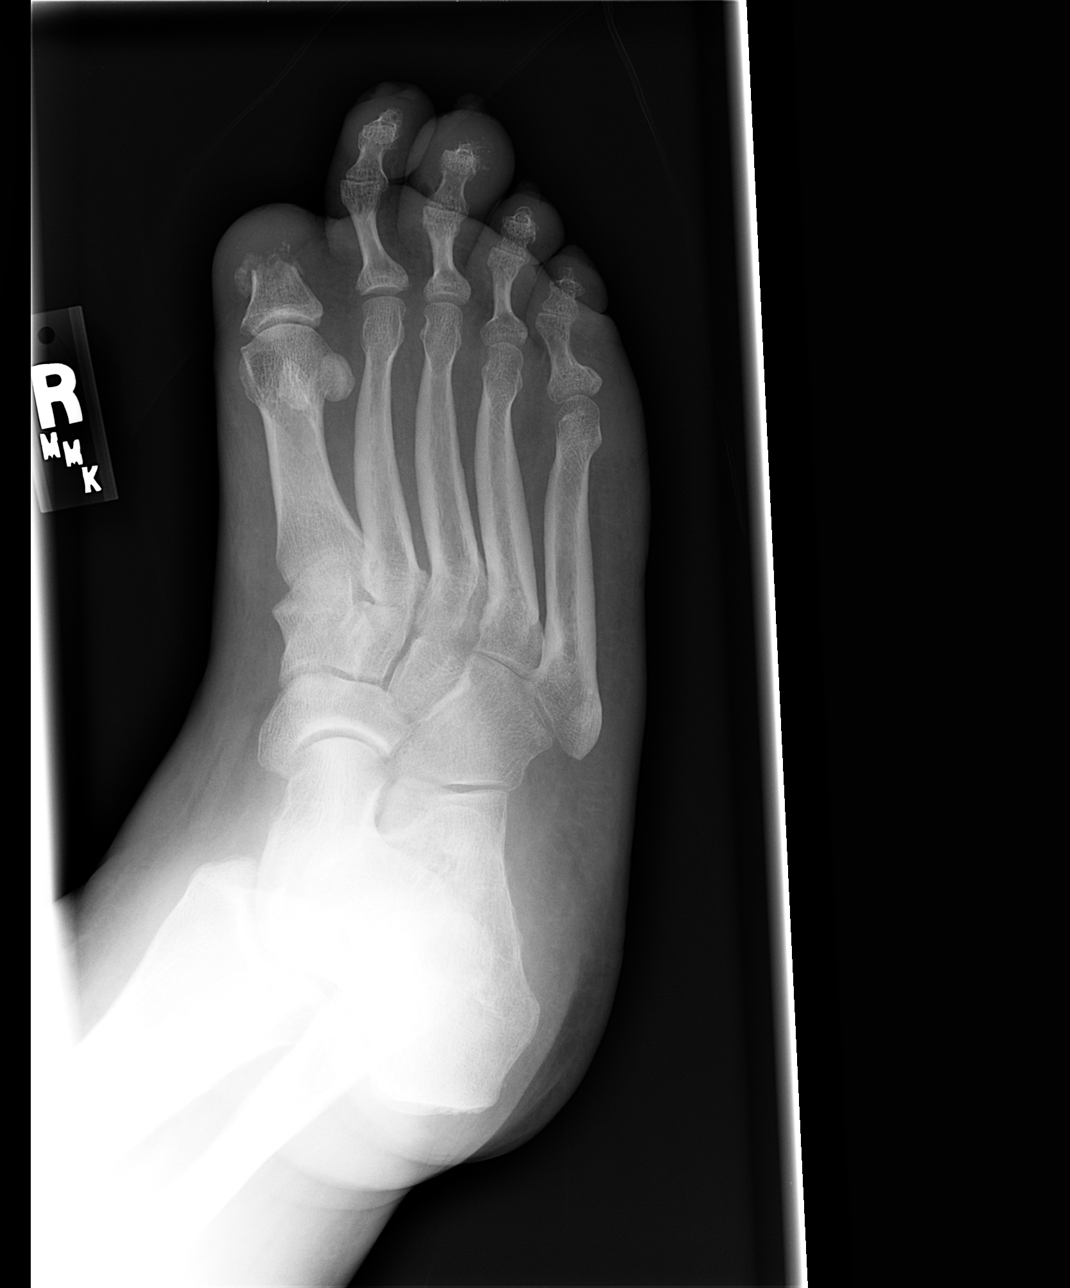

[view not recorded (3 of 3)]
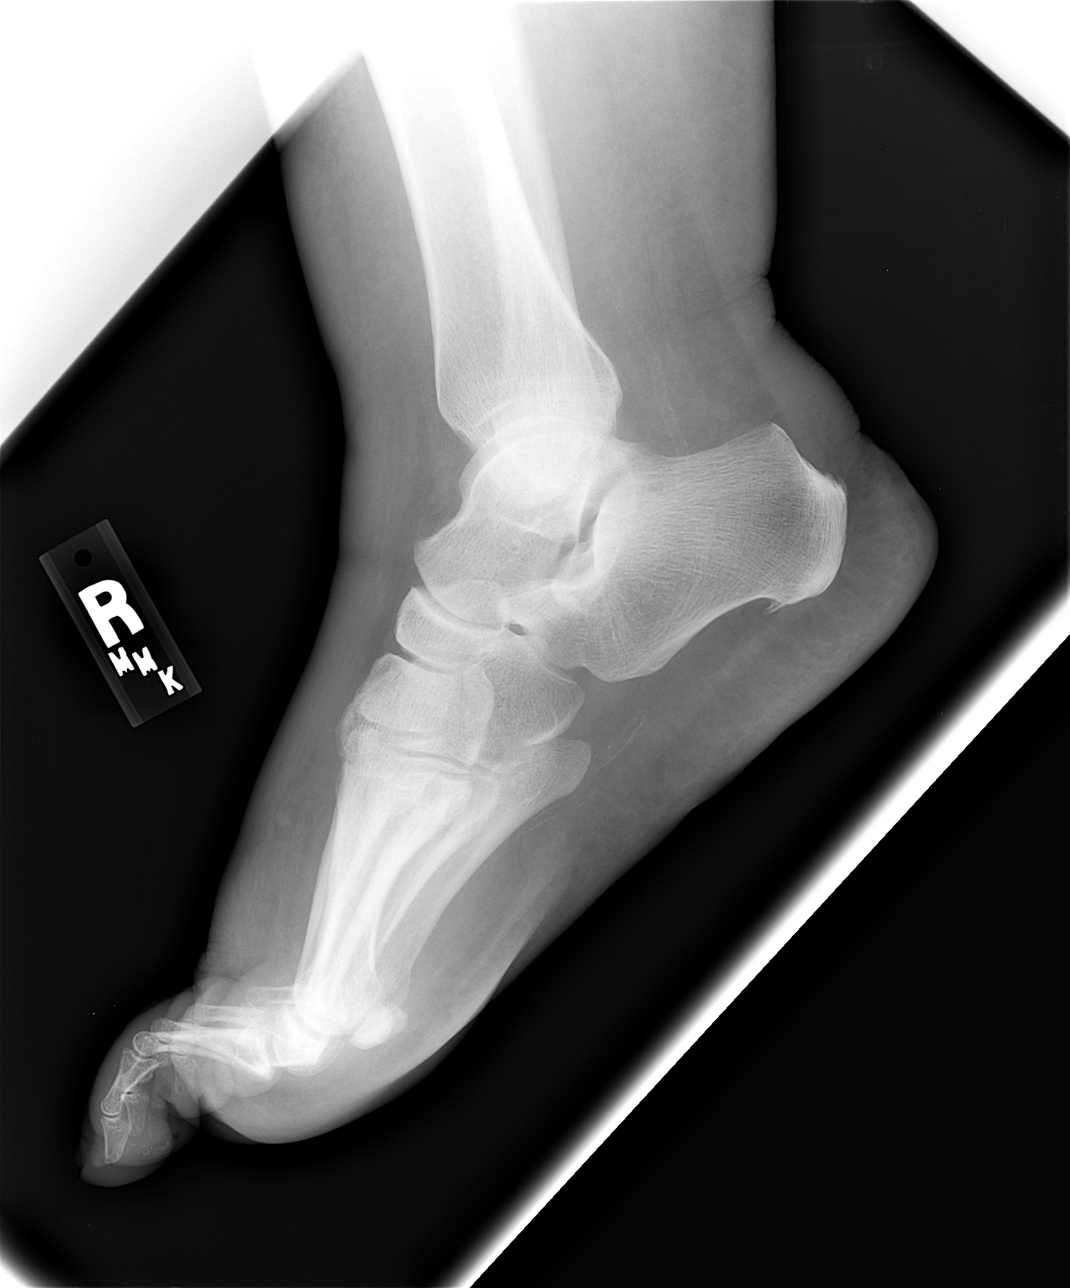

[3 of 3 positions shown; findings below may reference images not displayed]

FINDINGS: A right great toe partial amputation is again present.  The stump of the proximal phalanx is again present.  Periosteal reaction is present along the medial aspect.  There has been some erosion of the distal aspect of the stump.  These findings are worrisome for osteomyelitis.  The patient was noted to have edema within this stump on a recent MRI.  No other destructive bony lesions are seen.  No acute fractures or dislocations are seen.
IMPRESSION: Findings compatible with osteomyelitis of the stump of the proximal phalanx of the great toe.

## 2007-01-24 ENCOUNTER — Inpatient Hospital Stay (HOSPITAL_COMMUNITY): Admission: EM | Admit: 2007-01-24 | Discharge: 2007-01-24 | Payer: Self-pay | Admitting: Emergency Medicine

## 2007-03-03 ENCOUNTER — Ambulatory Visit (HOSPITAL_COMMUNITY): Admission: RE | Admit: 2007-03-03 | Discharge: 2007-03-03 | Payer: Self-pay | Admitting: Nephrology

## 2007-03-06 ENCOUNTER — Inpatient Hospital Stay (HOSPITAL_COMMUNITY): Admission: EM | Admit: 2007-03-06 | Discharge: 2007-03-09 | Payer: Self-pay | Admitting: Emergency Medicine

## 2007-04-16 ENCOUNTER — Inpatient Hospital Stay (HOSPITAL_COMMUNITY): Admission: EM | Admit: 2007-04-16 | Discharge: 2007-04-19 | Payer: Self-pay | Admitting: Emergency Medicine

## 2007-06-13 ENCOUNTER — Ambulatory Visit: Payer: Self-pay | Admitting: Surgery

## 2007-06-16 ENCOUNTER — Ambulatory Visit (HOSPITAL_COMMUNITY): Admission: RE | Admit: 2007-06-16 | Discharge: 2007-06-16 | Payer: Self-pay | Admitting: Surgery

## 2007-06-16 ENCOUNTER — Ambulatory Visit: Payer: Self-pay | Admitting: Surgery

## 2007-06-24 IMAGING — CR DG CHEST 2V
2 series · 2 of 2 positions shown · non-contrast
Comparison: none

CLINICAL DATA: Cough and chest tightness

Chest 2 view:
Comparison 08/12/2005. The heart size and mediastinal contours are within normal
limits.  Both lungs are clear.  The visualized skeletal structures are
unremarkable.

[w chest pa]
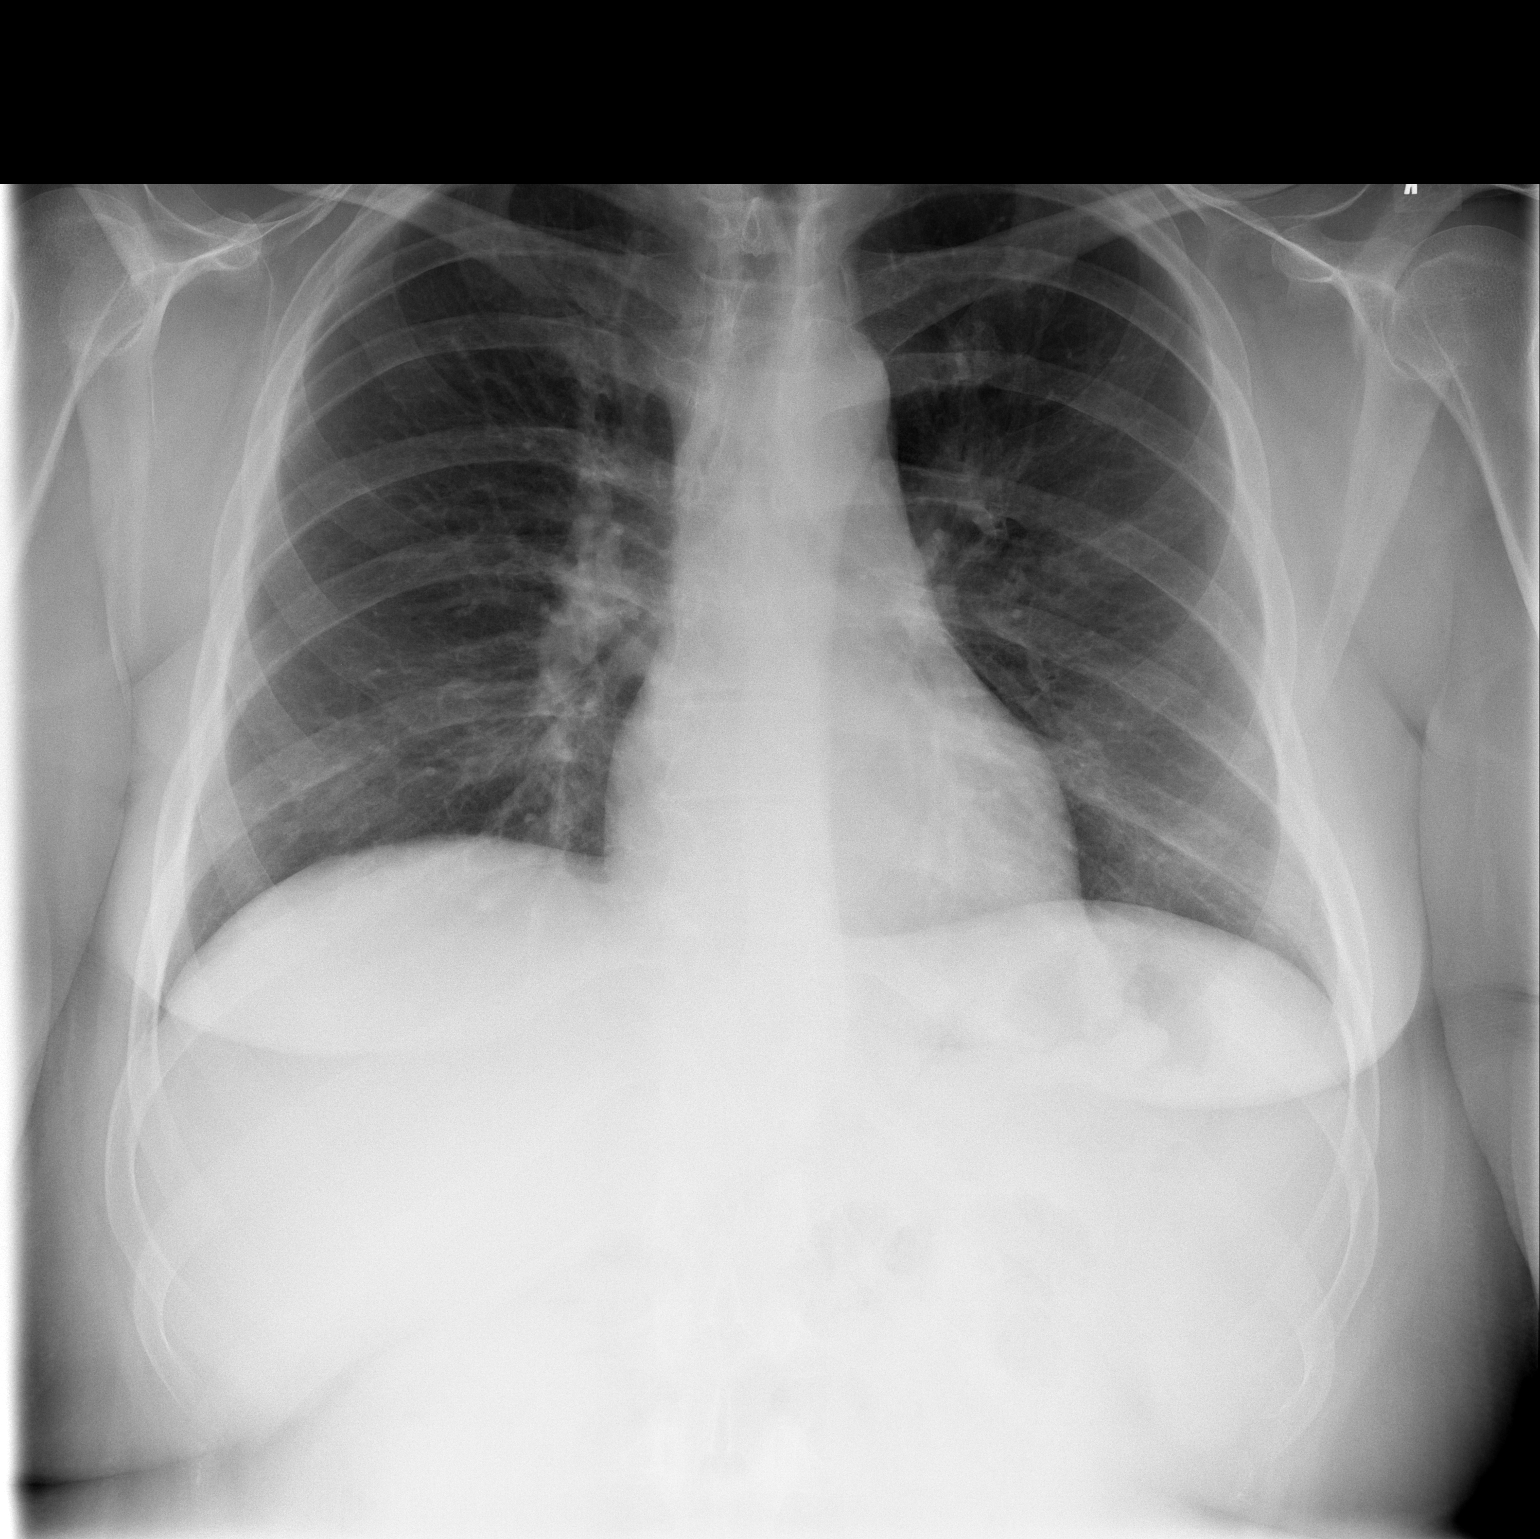

[w chest lat]
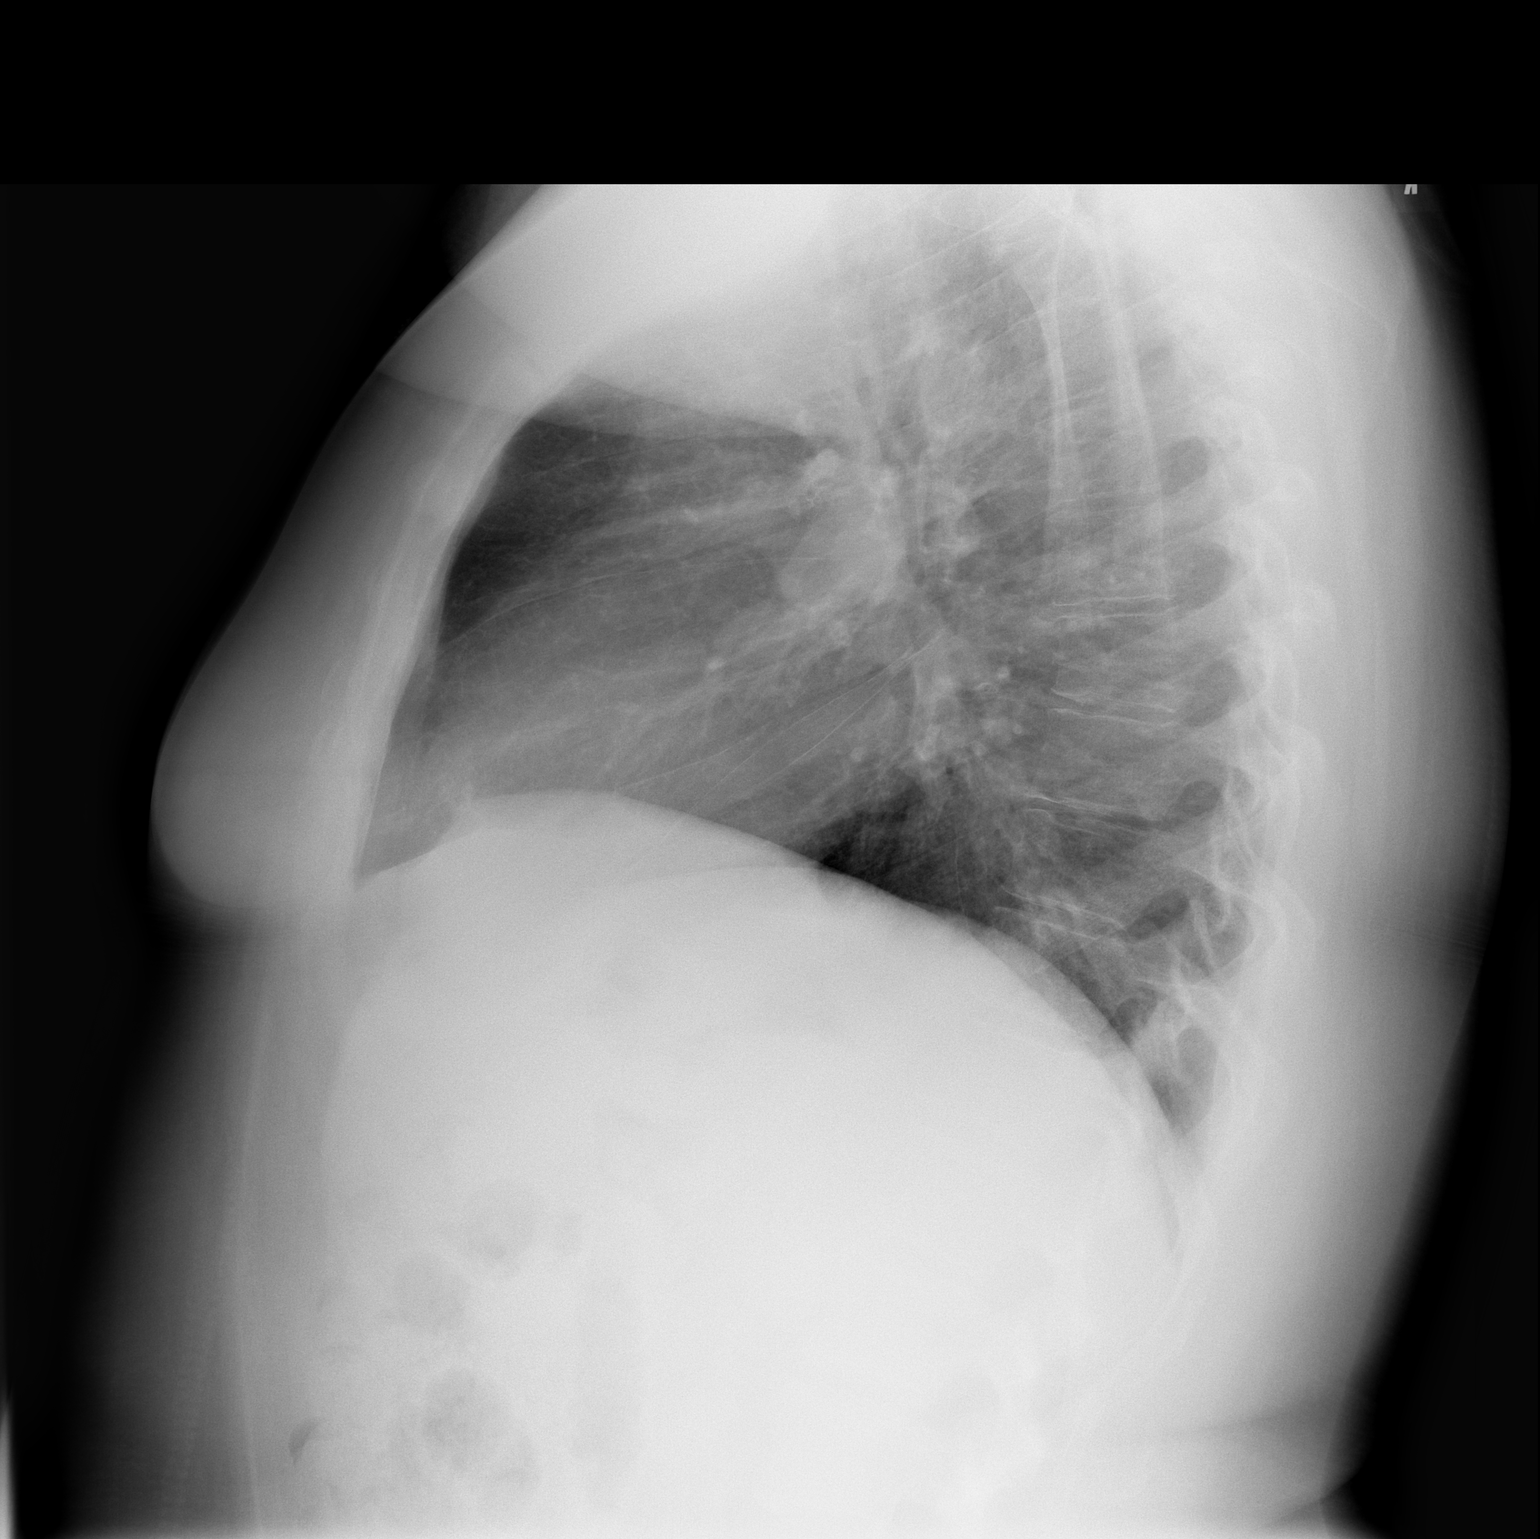

[2 of 2 positions shown; findings below may reference images not displayed]

IMPRESSION: 1. No active cardiopulmonary disease.

## 2007-09-26 IMAGING — CR DG FOOT COMPLETE 3+V*R*
3 series · 3 of 3 positions shown · non-contrast
Comparison: 02/03/05.

CLINICAL DATA: Redness and blistering at site of amputation of toes. 
RIGHT FOOT THREE VIEWS:

[t foot ap right]
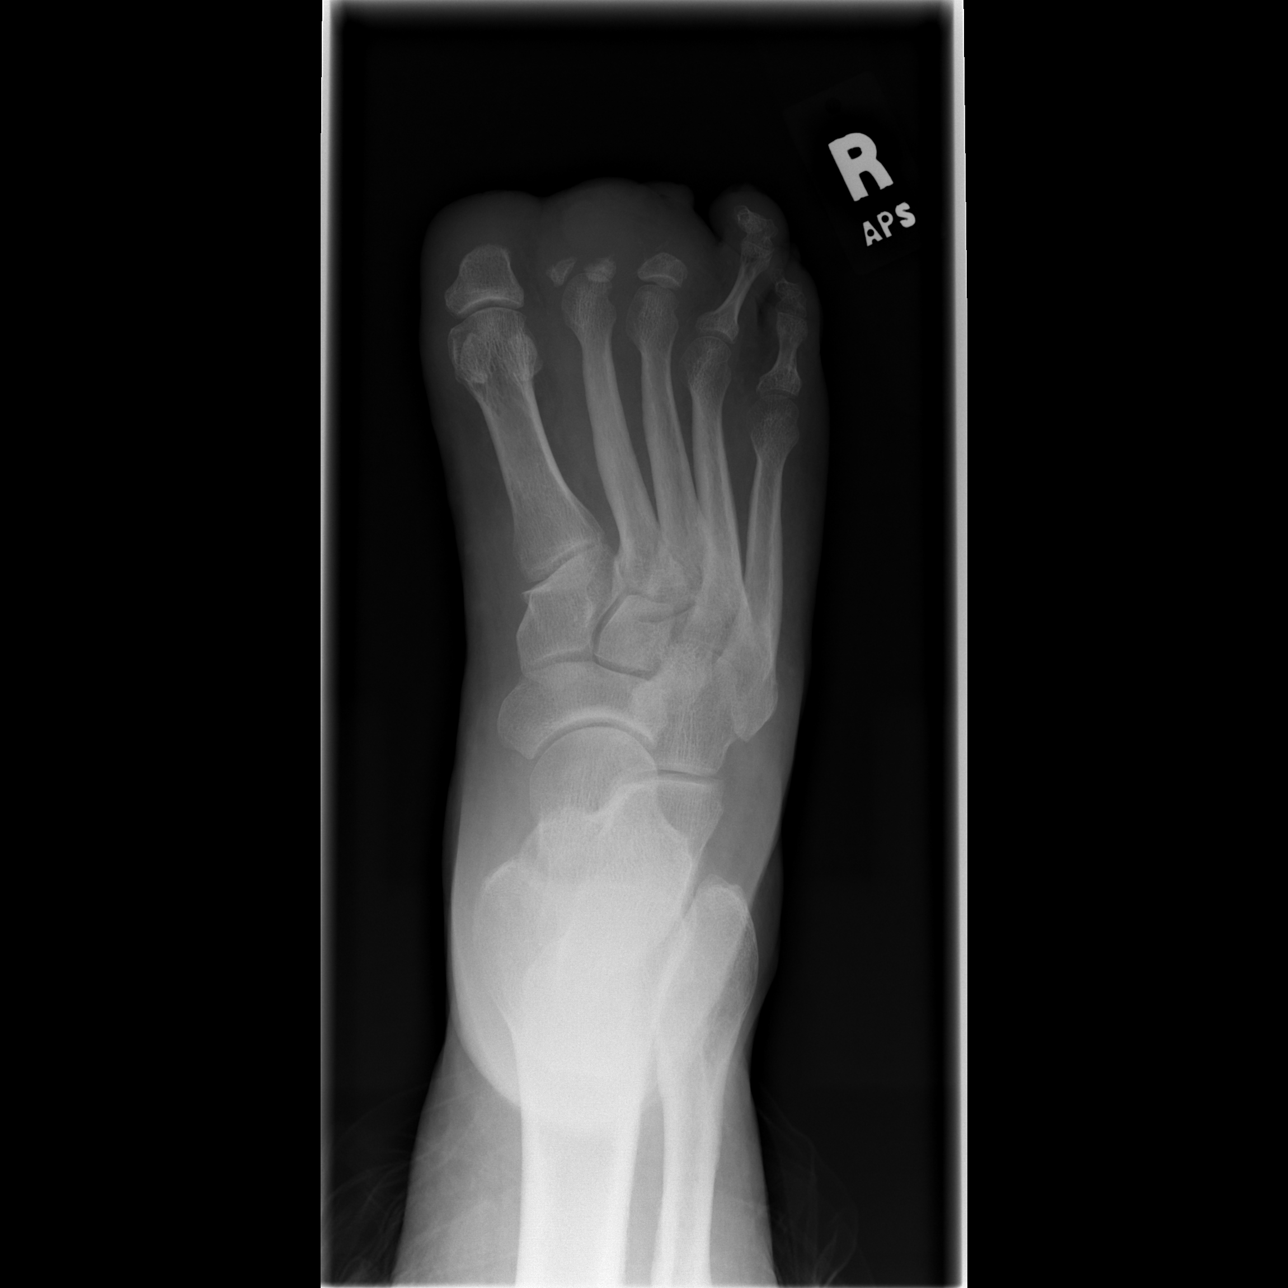

[t foot oblique right]
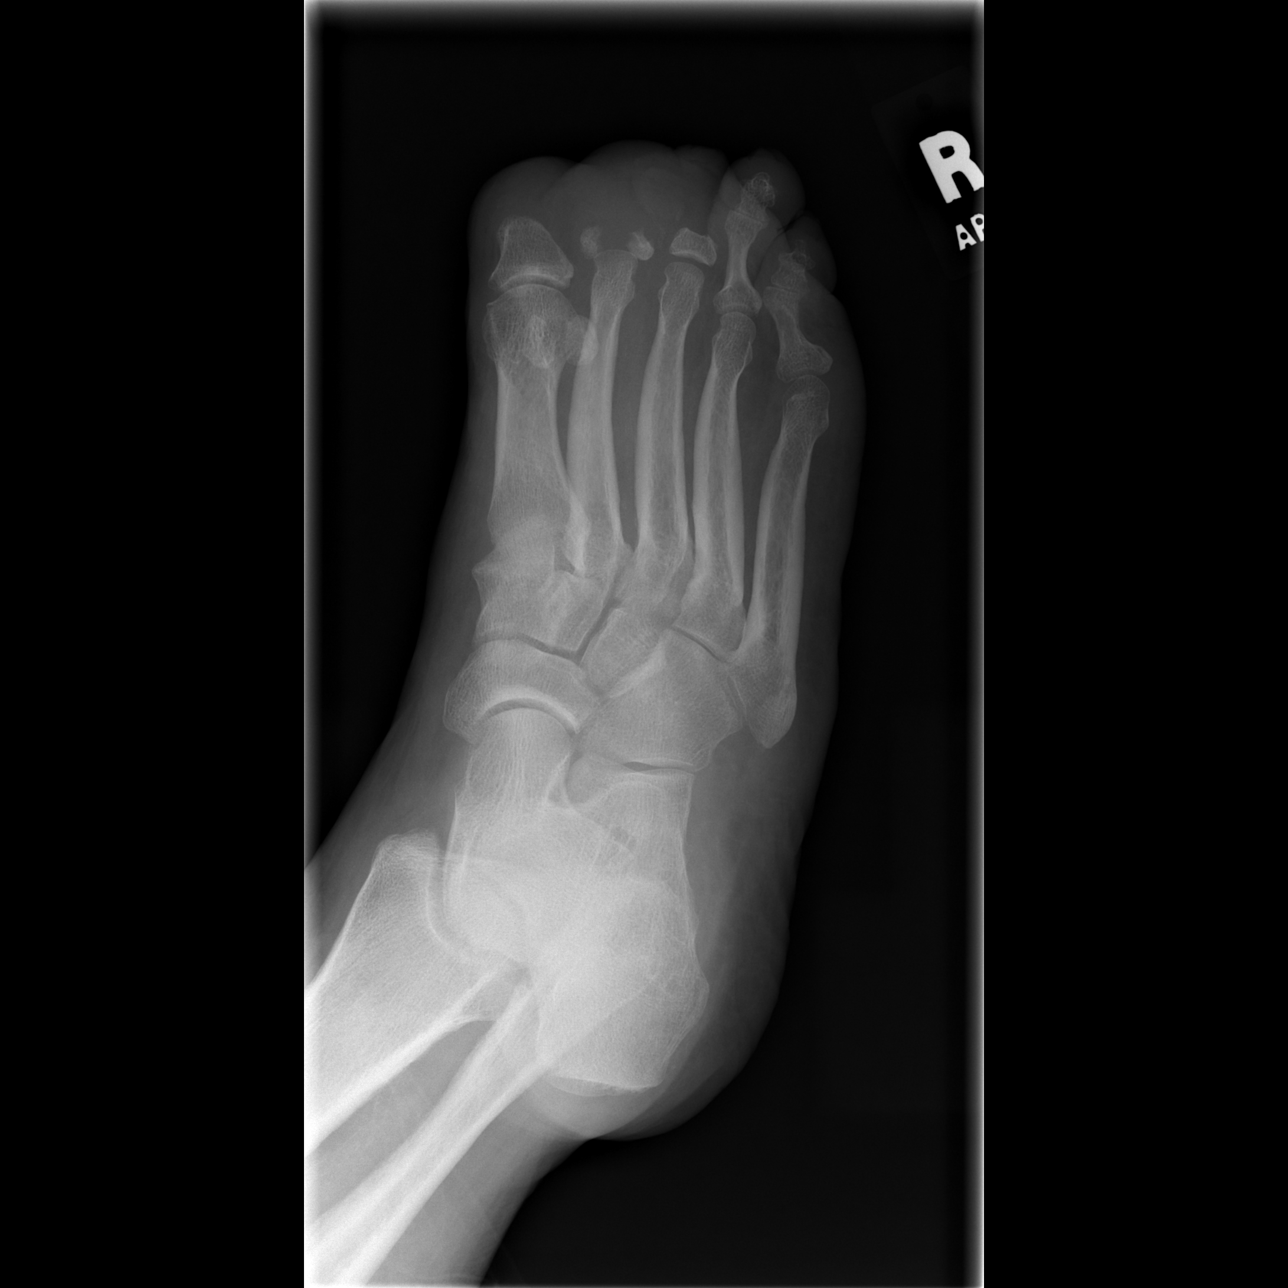

[t foot lat right]
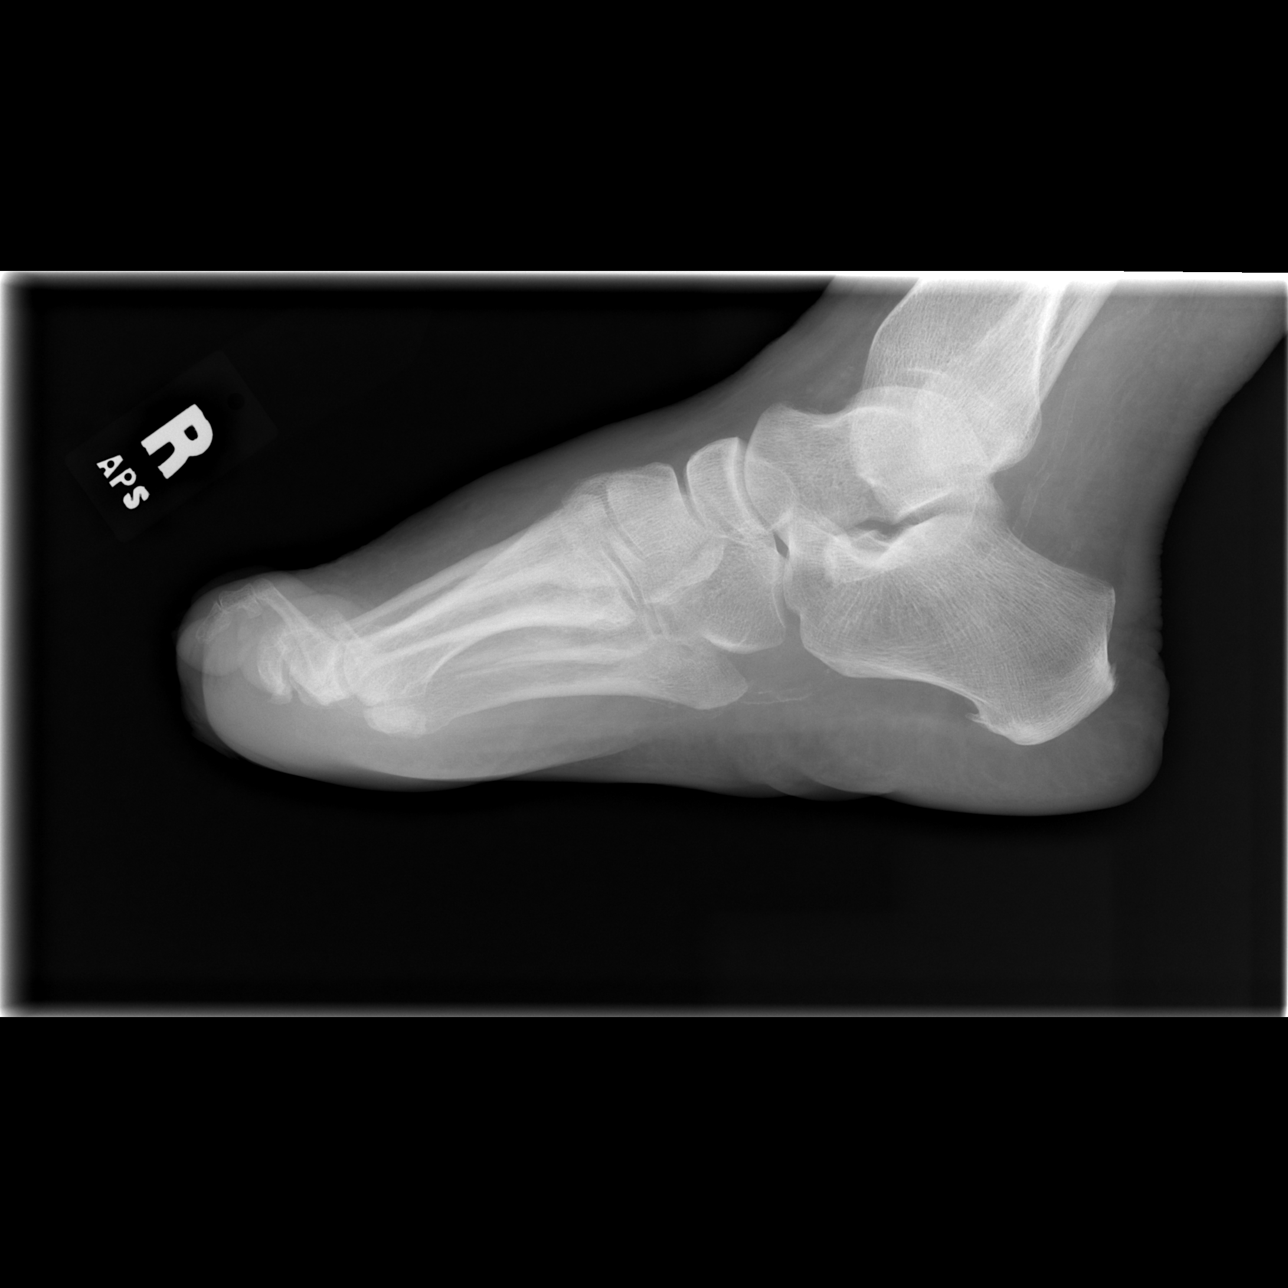

[3 of 3 positions shown; findings below may reference images not displayed]

FINDINGS: Since the prior study, the patient has undergone amputation of the distal aspects of the second and third toes leaving some remnants of the proximal phalanges.  Proximal phalanx of the second digit is divided, and shows some irregularity and osteolysis of the both components, consistent with active osteomyelitis.  Former changes of osteomyelitis involving the stump of the great toe, appear to have resolved.
IMPRESSION: Findings suggestive of osteomyelitis involving the remnant fragments of the proximal phalanx of the second digit.

## 2008-01-19 ENCOUNTER — Ambulatory Visit (HOSPITAL_COMMUNITY): Admission: RE | Admit: 2008-01-19 | Discharge: 2008-01-19 | Payer: Self-pay | Admitting: Nephrology

## 2008-02-03 ENCOUNTER — Emergency Department (HOSPITAL_COMMUNITY): Admission: EM | Admit: 2008-02-03 | Discharge: 2008-02-03 | Payer: Self-pay | Admitting: Emergency Medicine

## 2008-04-02 ENCOUNTER — Ambulatory Visit (HOSPITAL_COMMUNITY): Admission: RE | Admit: 2008-04-02 | Discharge: 2008-04-02 | Payer: Self-pay | Admitting: Gastroenterology

## 2008-04-02 ENCOUNTER — Ambulatory Visit: Payer: Self-pay | Admitting: *Deleted

## 2008-04-02 IMAGING — XA IR ANGIO/A/V SHUNT*R*
1 series · 13 of 24 positions shown · non-contrast
Comparison: none

CLINICAL DATA: Elevated venous pressure.  
 AV SHUNTOGRAM 06/15/06 AT 7777 HOURS:
TECHNIQUE: The right arm was prepped and draped in a sterile fashion.  An 18-gauge Angiocath was inserted into the AV graft.  Contrast was injected without complication.

[Series 1: run · 13 of 38 slices shown]
[im 1/38]
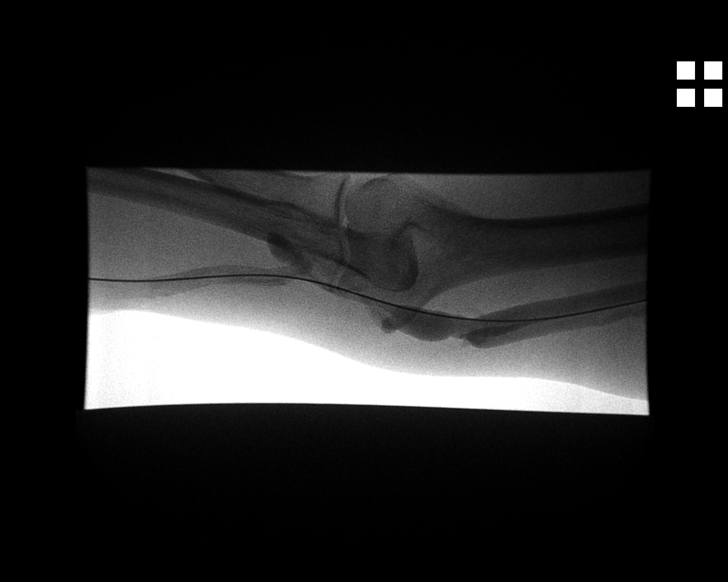
[im 4/38]
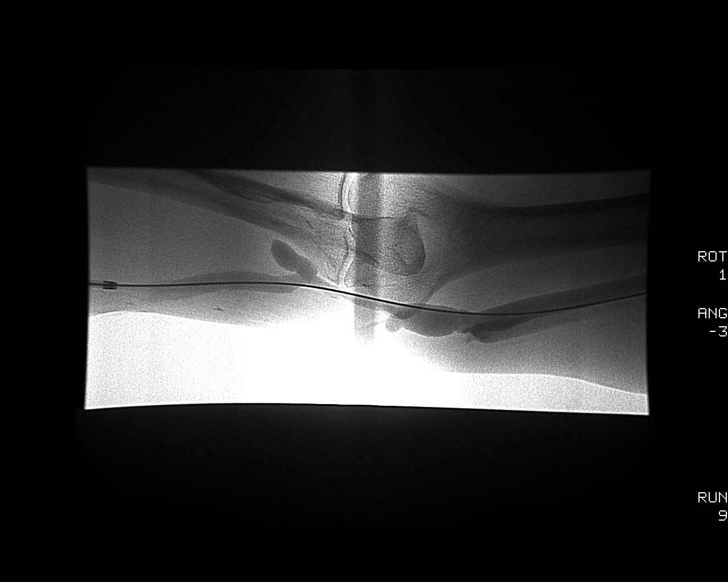
[im 7/38]
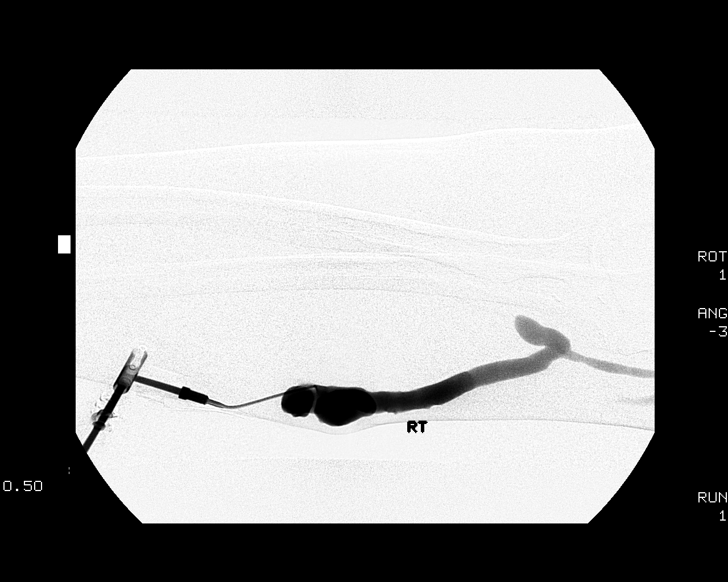
[im 10/38]
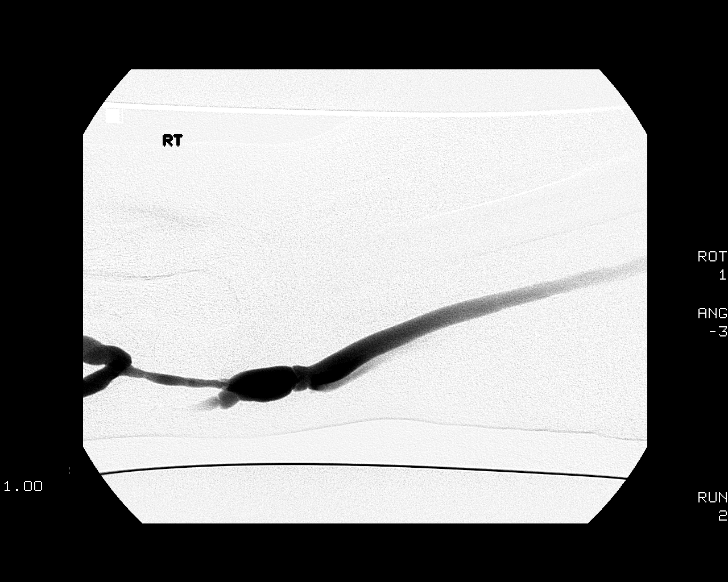
[im 13/38]
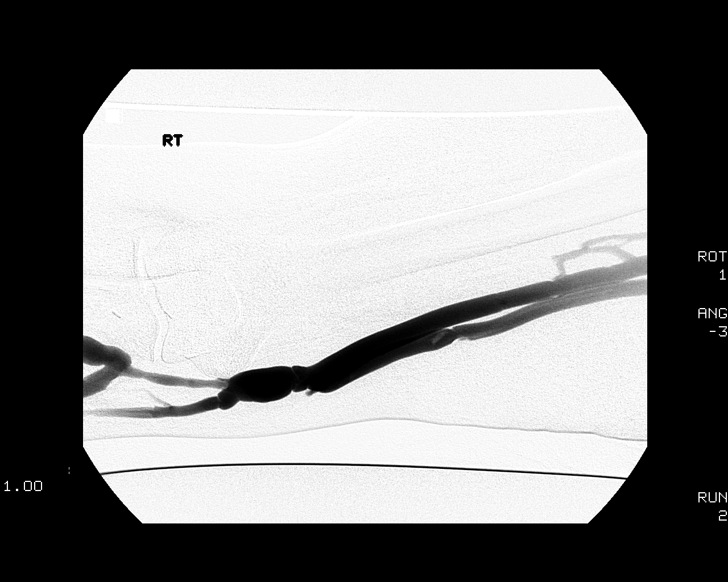
[im 17/38]
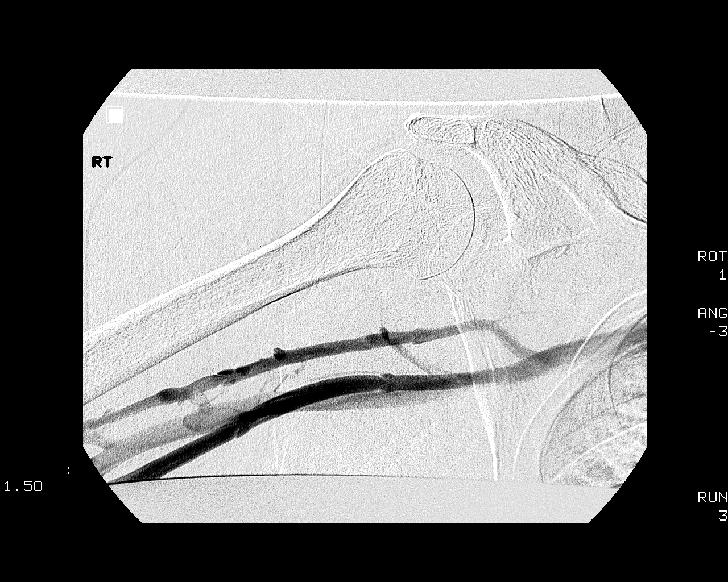
[im 20/38]
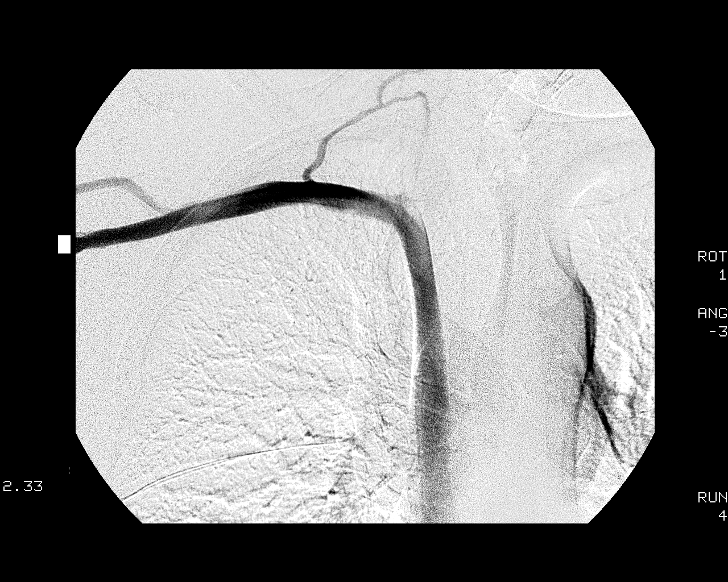
[im 21/38]
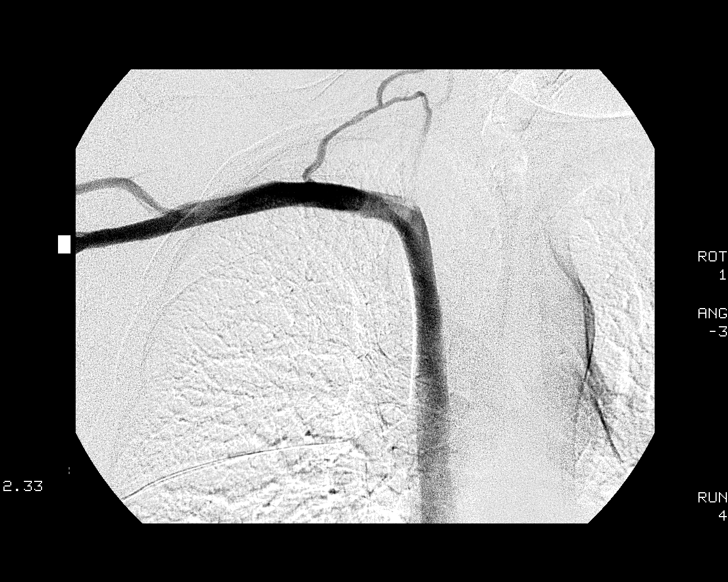
[im 25/38]
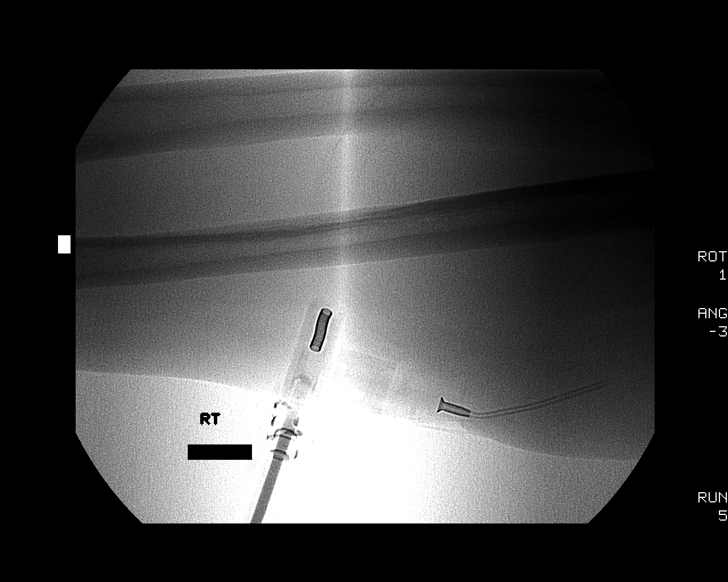
[im 28/38]
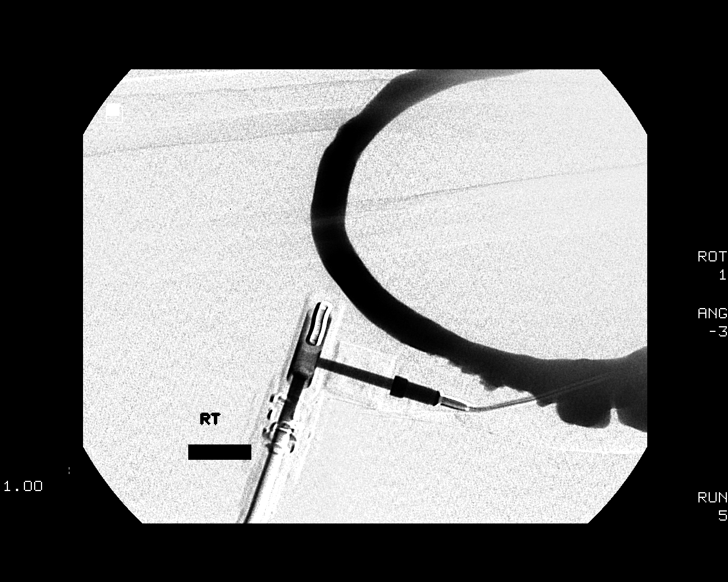
[im 31/38]
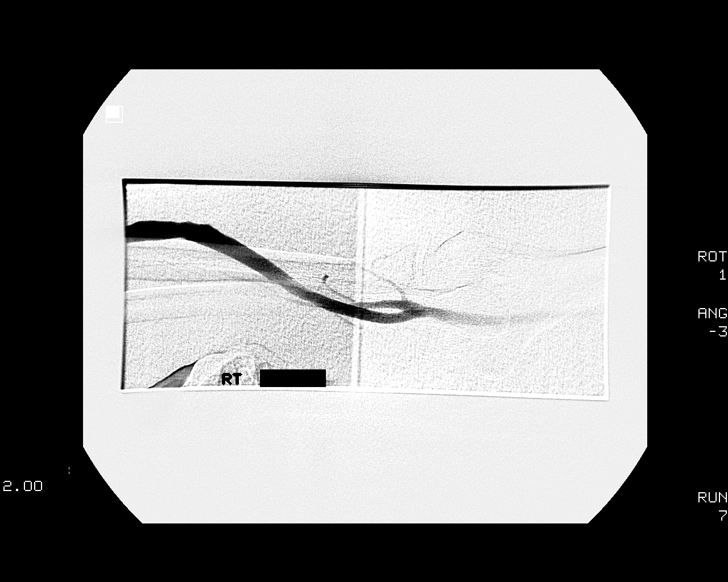
[im 34/38]
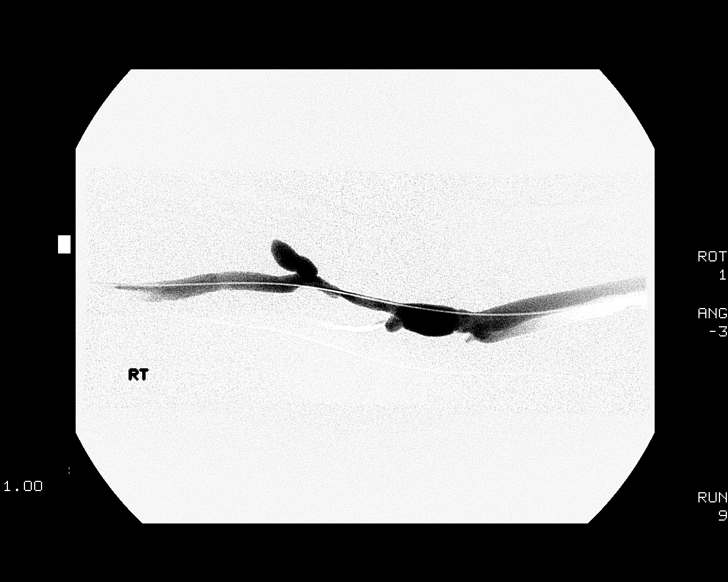
[im 38/38]
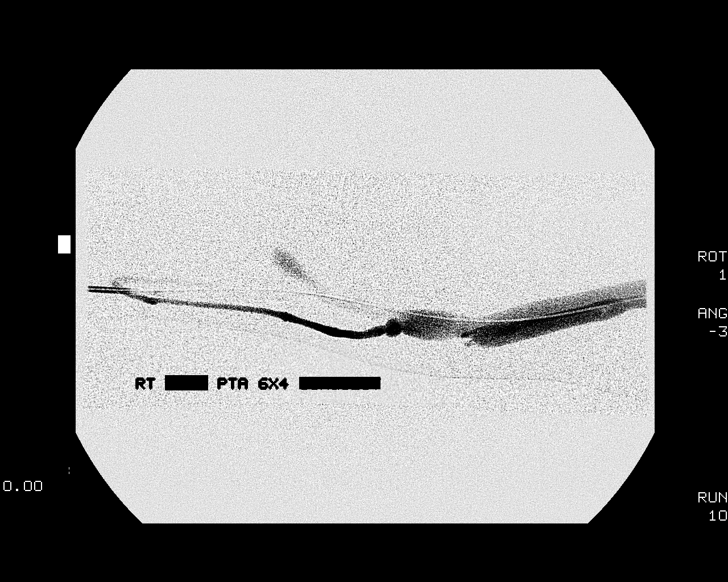

[13 of 24 positions shown; findings below may reference images not displayed]

FINDINGS: There is a long segment stenosis at the venous anastomosis.  The graft, arterial anastomosis and central venous structures are widely patent.
IMPRESSION: Venous anastomosis stenosis.  
 RIGHT ARM VENOUS ANGIOPLASTY:
 Procedure:  Lidocaine was utilized for local anesthesia.  The 18-gauge Angiocath was exchanged over a Bentson wire for a 6 French sheath. A 6 mm angioplasty device was utilized to dilate the venous anastomosis.  Post angioplasty images demonstrate wide patency.  The sheath was removed and hemostasis was achieved with a 1-0 Prolene figure 8 stitch.  No complications:
FINDINGS: Post angioplasty imaging demonstrates patency with mild residual narrowing.
IMPRESSION: Successful dilatation of the venous anastomosis to 6 mm.

## 2008-04-27 ENCOUNTER — Ambulatory Visit (HOSPITAL_COMMUNITY): Admission: RE | Admit: 2008-04-27 | Discharge: 2008-04-27 | Payer: Self-pay | Admitting: Nephrology

## 2008-05-20 ENCOUNTER — Emergency Department (HOSPITAL_COMMUNITY): Admission: EM | Admit: 2008-05-20 | Discharge: 2008-05-20 | Payer: Self-pay | Admitting: Emergency Medicine

## 2008-06-12 ENCOUNTER — Ambulatory Visit: Payer: Self-pay | Admitting: Internal Medicine

## 2008-06-12 DIAGNOSIS — I10 Essential (primary) hypertension: Secondary | ICD-10-CM

## 2008-06-12 DIAGNOSIS — R05 Cough: Secondary | ICD-10-CM

## 2008-06-12 DIAGNOSIS — R059 Cough, unspecified: Secondary | ICD-10-CM | POA: Insufficient documentation

## 2008-09-03 IMAGING — XA IR ANGIO/A/V SHUNT*R*
1 series · 12 of 24 positions shown · non-contrast
Comparison: none

CLINICAL DATA: Low flows during dialysis.

Dialysis fistulogram:
Venous angioplasty:
TECHNIQUE: An 18-gauge angiocatheter was placed   retrograde in the venous limb 
of the patient's right  forearm AV hemodialysis fistula for dialysis
fistulography. The angiocatheter was removed. Overlying   skin was then prepped
with Betadine, draped in usual sterile fashion, infiltrated locally with 1%
lidocaine. The venous limb was accessed antegrade with a 19-gauge percutaneous
entry needle, exchanged over a Benson wire for a 6 French vascular sheath,
through which a 7 mm x 4 cm Conquest angioplasty balloon was advanced to the
level of a recurrent outflow vein stenosis for venous angioplasty using 60
second  overlapping inflations at 20 atmospheres. After follow-up venography,
the catheter, sheath, and guidewire were removed and hemostasis achieved with a
2-0 Ethilon purse-string suture. No immediate complication.

[Series 1: run · 12 of 49 slices shown]
[im 3/49]
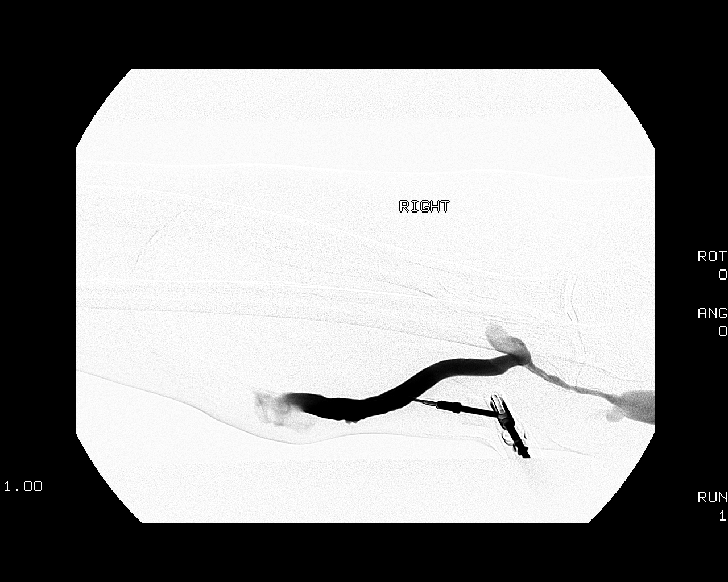
[im 7/49]
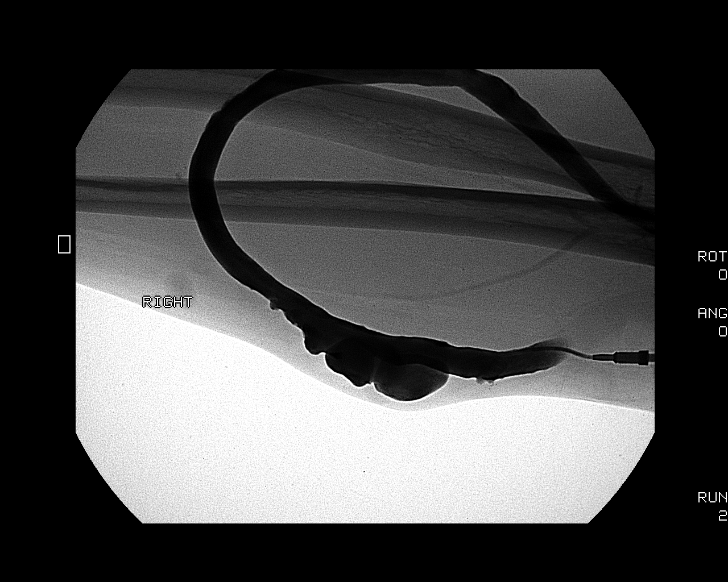
[im 11/49]
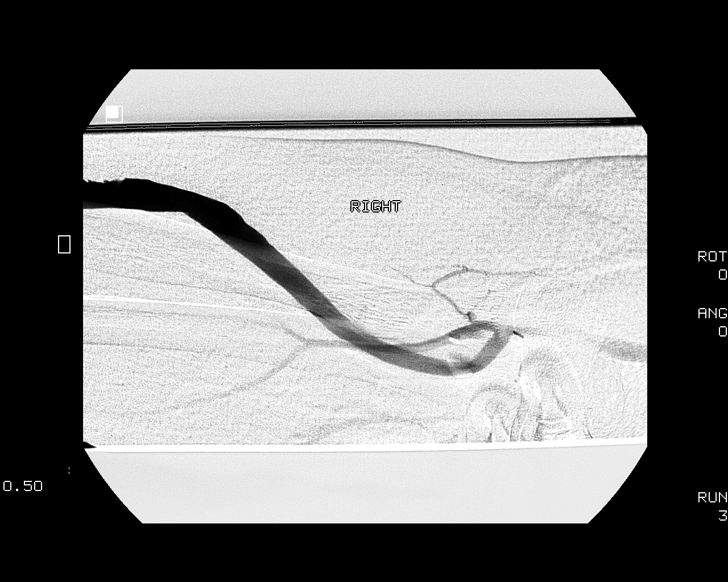
[im 15/49]
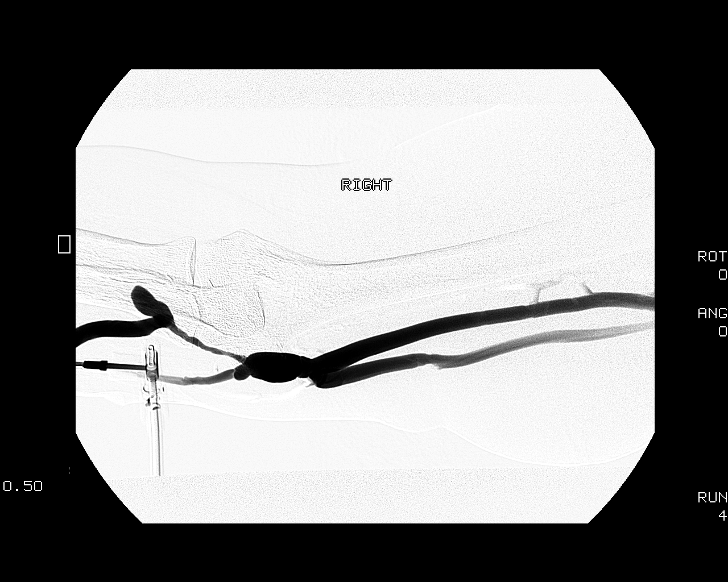
[im 19/49]
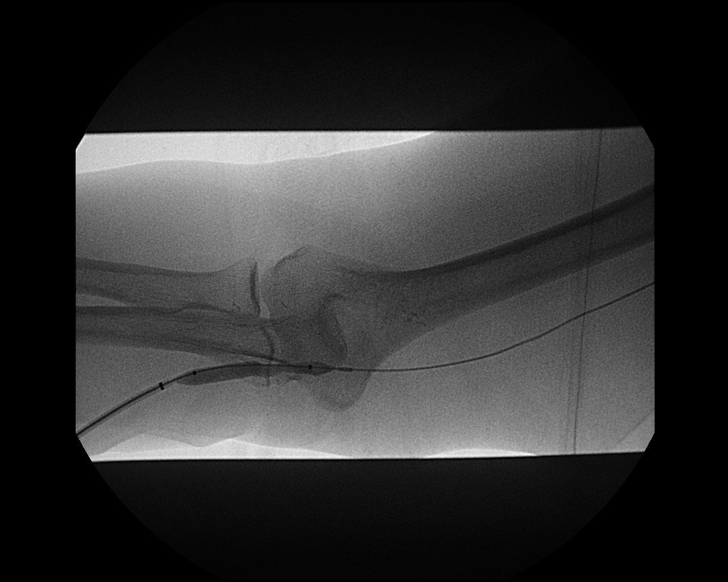
[im 23/49]
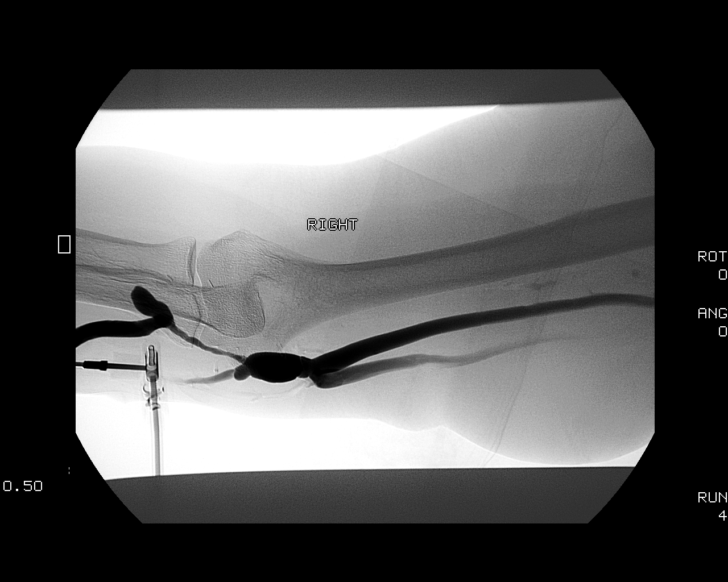
[im 28/49]
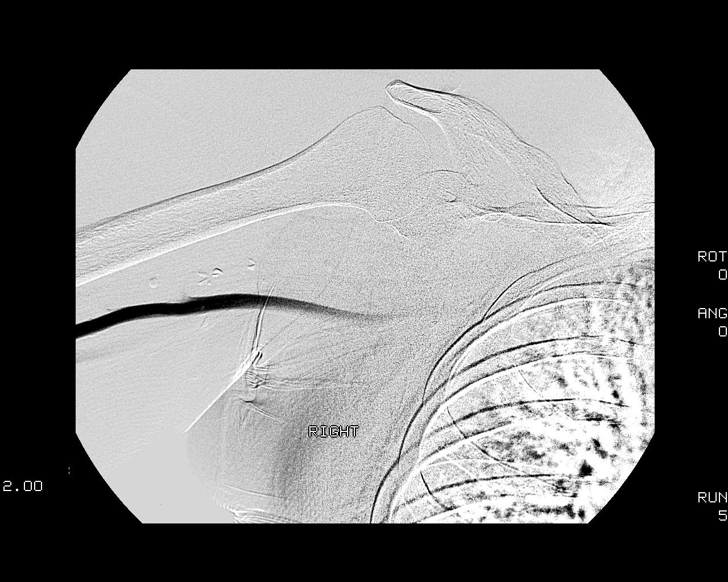
[im 32/49]
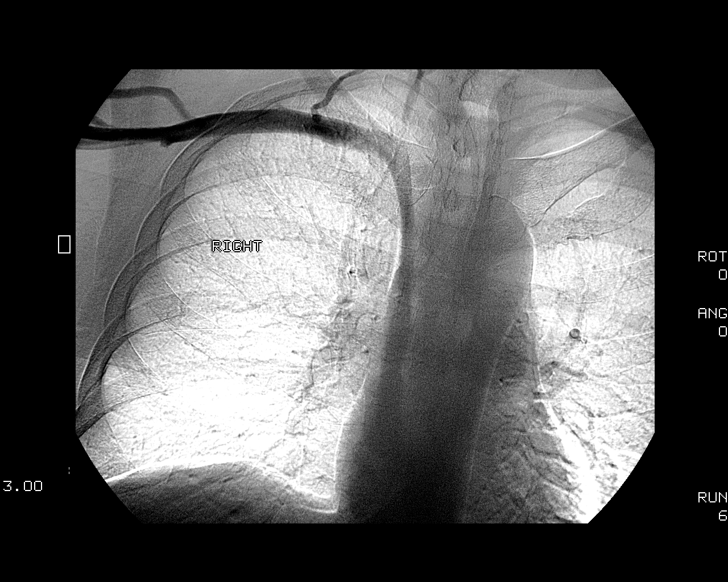
[im 36/49]
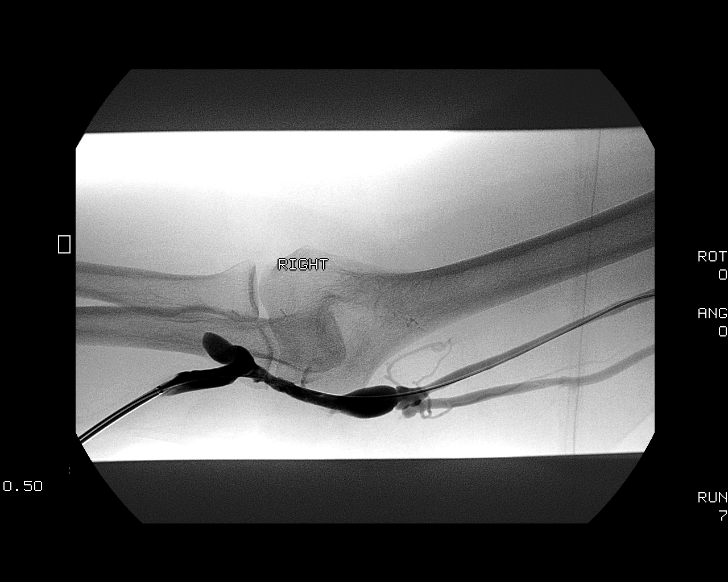
[im 40/49]
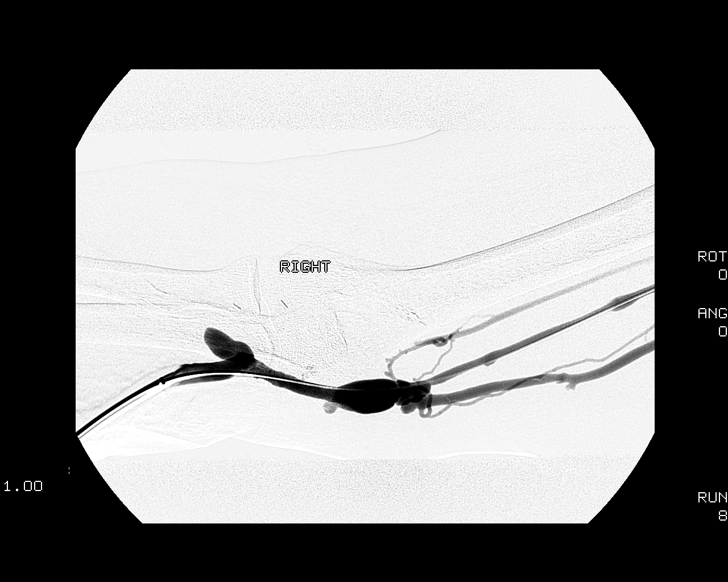
[im 44/49]
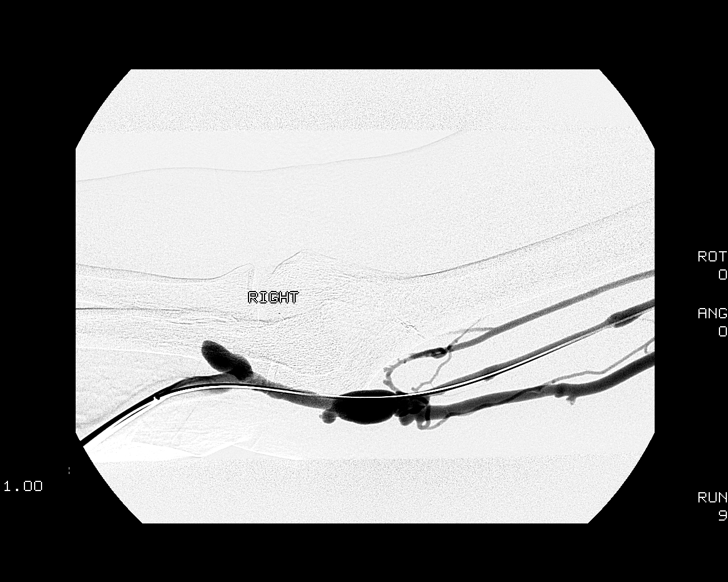
[im 49/49]
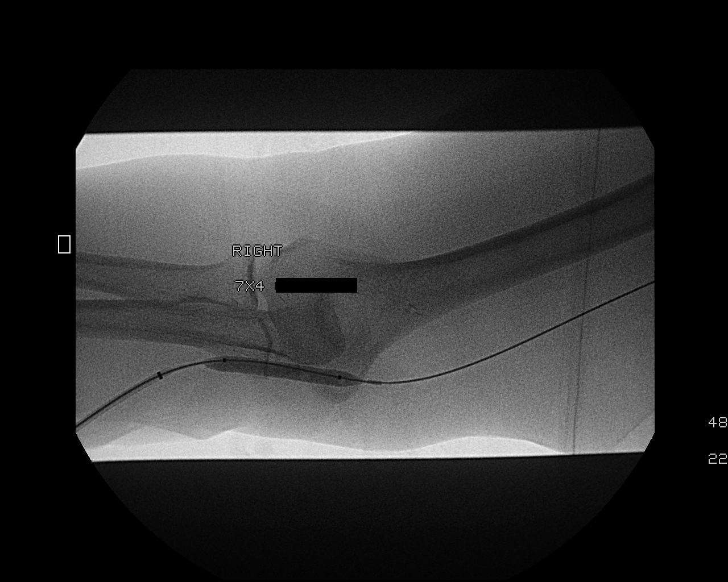

[12 of 24 positions shown; findings below may reference images not displayed]

FINDINGS: Reflux across the arterial anastomosis with the brachial artery at the
elbow shows this to be widely patent. Mild irregularity and small
pseudoaneurysms from the arterial and venous limbs of the graft without
stenosis. The venous anastomosis is widely patent. Just central to the venous
anastomosis is a 3 cm long segment of recurrent high-grade venous stenosis. More
centrally, the outflow bifurcates into brachial and basilic components, both of
which are widely patent centrally. The more central venous structures are
unremarkable.

The outflow vein recurrent stenosis responded readily to 7 mm balloon
angioplasty. Initial followup venogram showed a focal area of extravasation. The
balloon was held inflated for several more minutes across this lesion to allow
tamponade. Final venogram shows no further extravasation, good flow through the
treated segment, no with no recurrent stenosis, dissection, or other
complication. Patient tolerated the procedure well.
IMPRESSION: 1. Recurrent outflow vein stenosis, with ultimately good response to 7 mm
balloon angioplasty.
2. Negative for central venous stenosis or occlusion.

## 2008-09-03 IMAGING — CR DG CHEST 2V
2 series · 2 of 2 positions shown · non-contrast
Comparison: 09/05/05

CLINICAL DATA: Cough.   Previous smoker.  Dialysis.
 CHEST - 2 VIEW:

[view not recorded (1 of 2)]
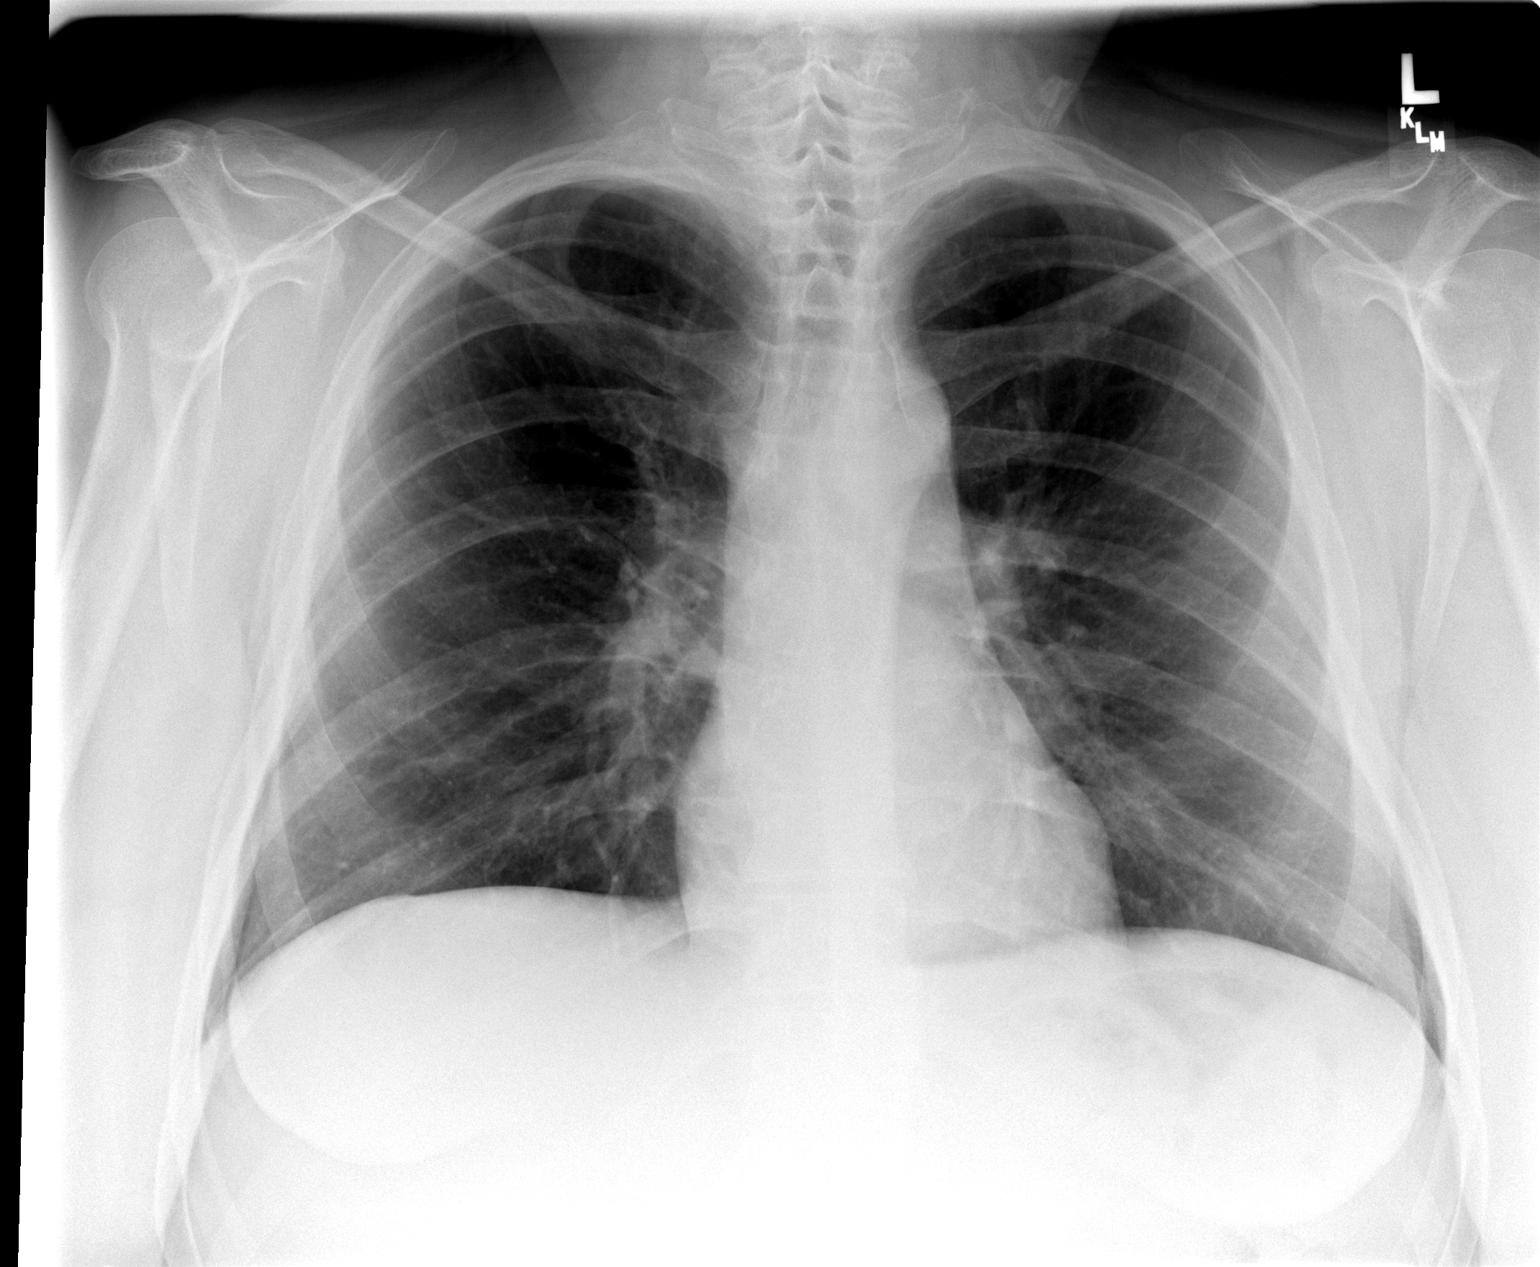

[view not recorded (2 of 2)]
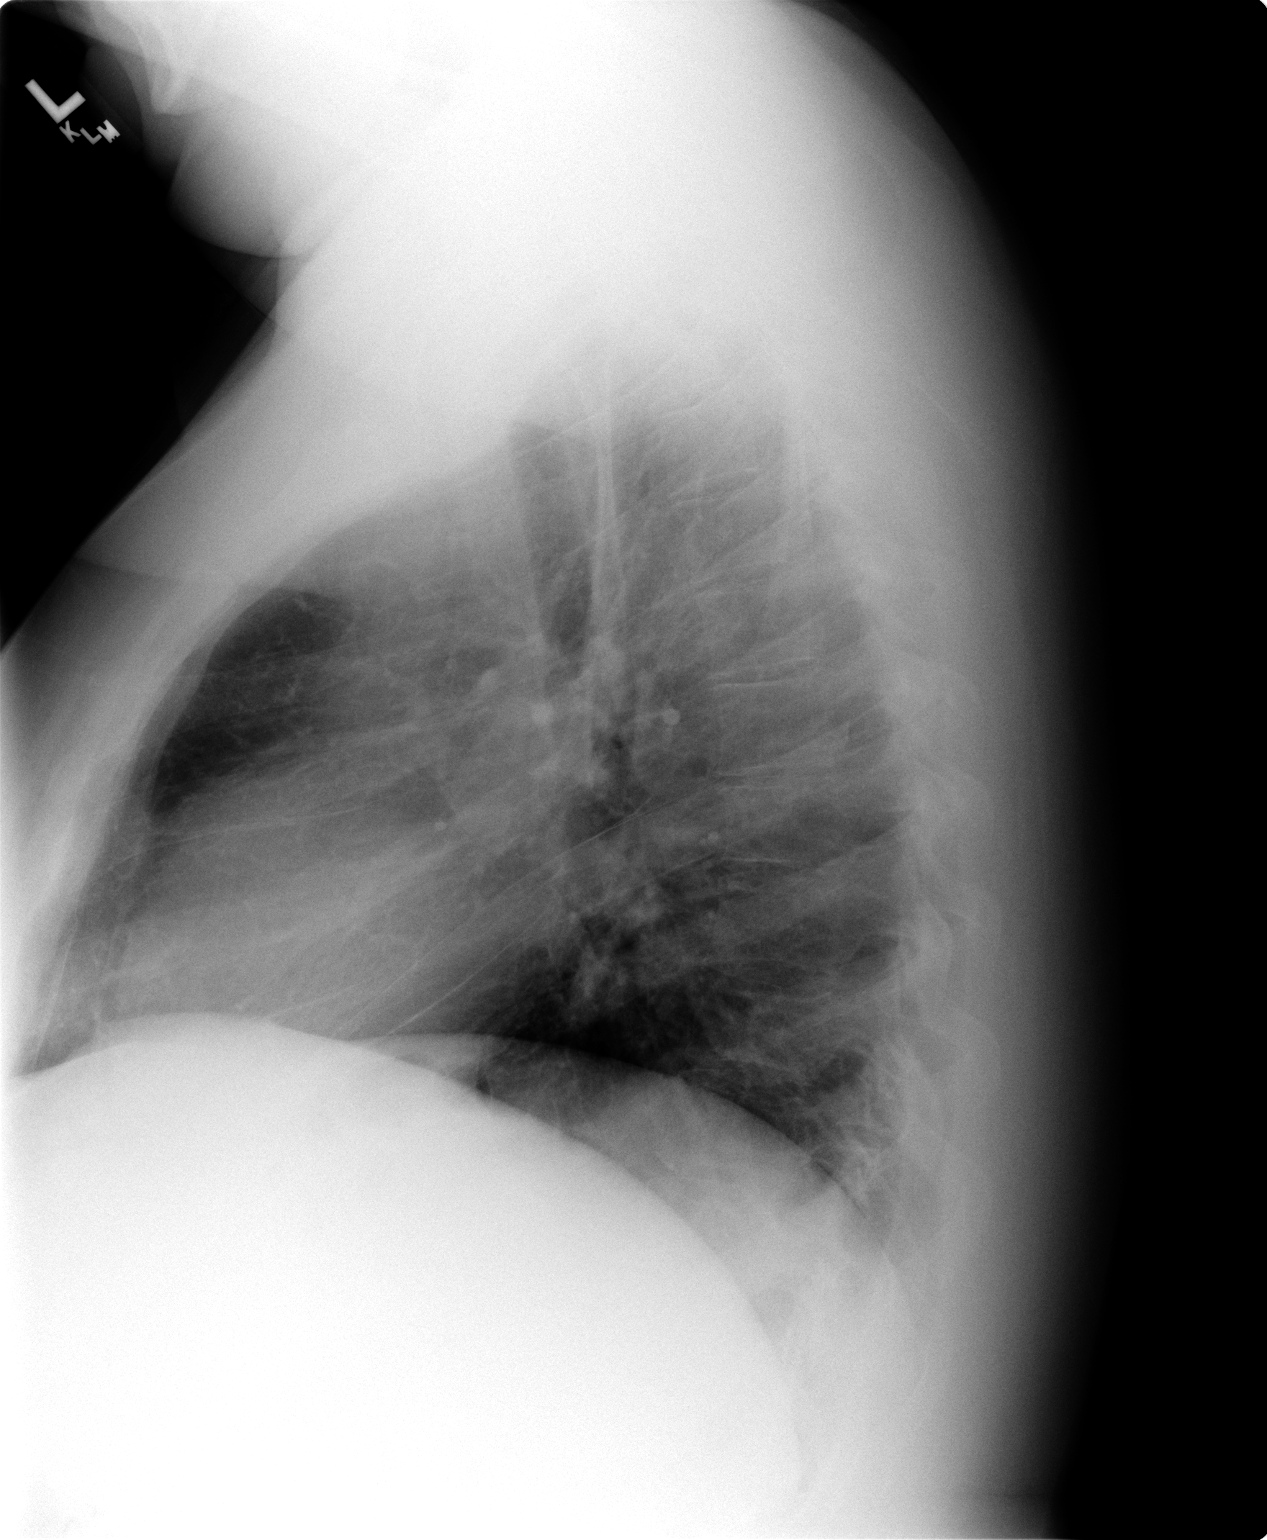

[2 of 2 positions shown; findings below may reference images not displayed]

FINDINGS: The heart size and mediastinal contours are within normal limits.  Both lungs are clear.  The visualized skeletal structures are unremarkable.
IMPRESSION: No active cardiopulmonary disease.

## 2008-11-11 IMAGING — CR DG CHEST 2V
2 series · 2 of 2 positions shown · non-contrast
Comparison: 11/16/06.

CLINICAL DATA: 40-year-old female, cough.  Rhinorrhea.

[w chest pa]
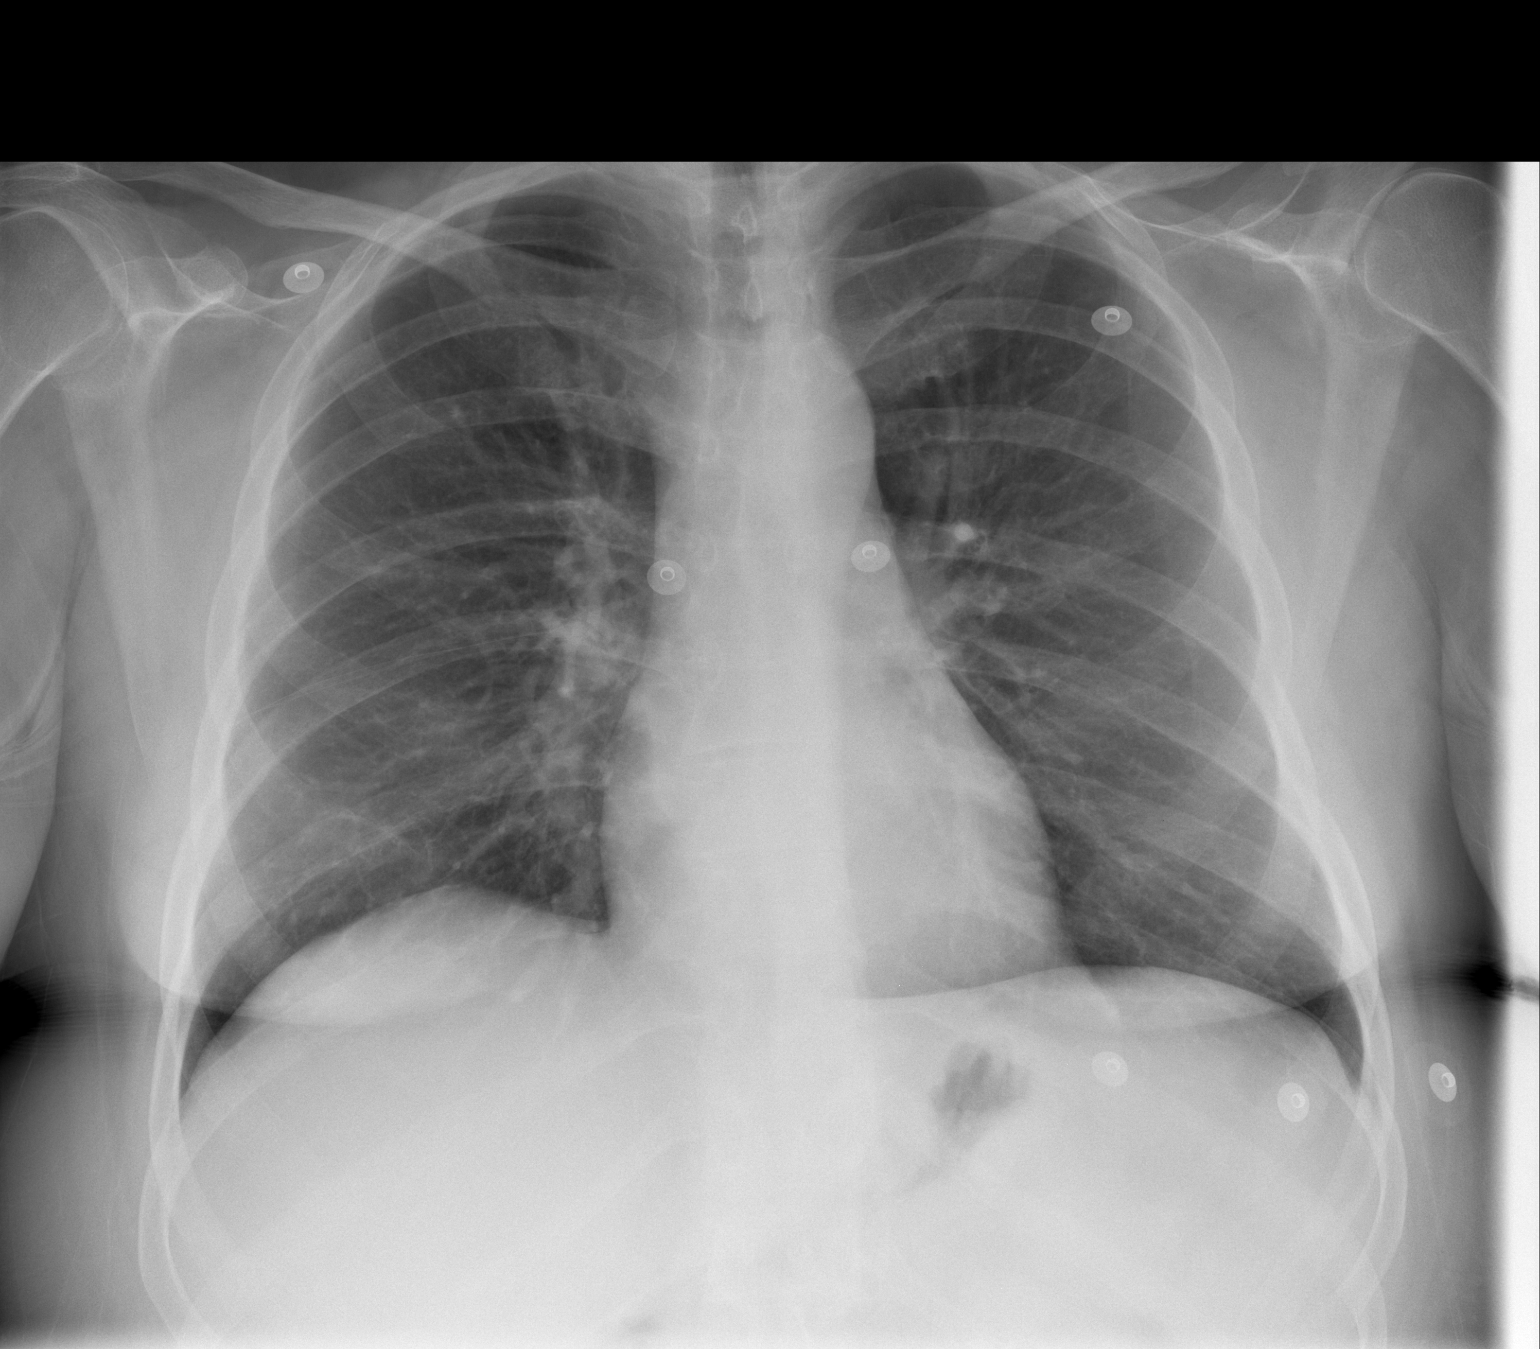

[w chest lat]
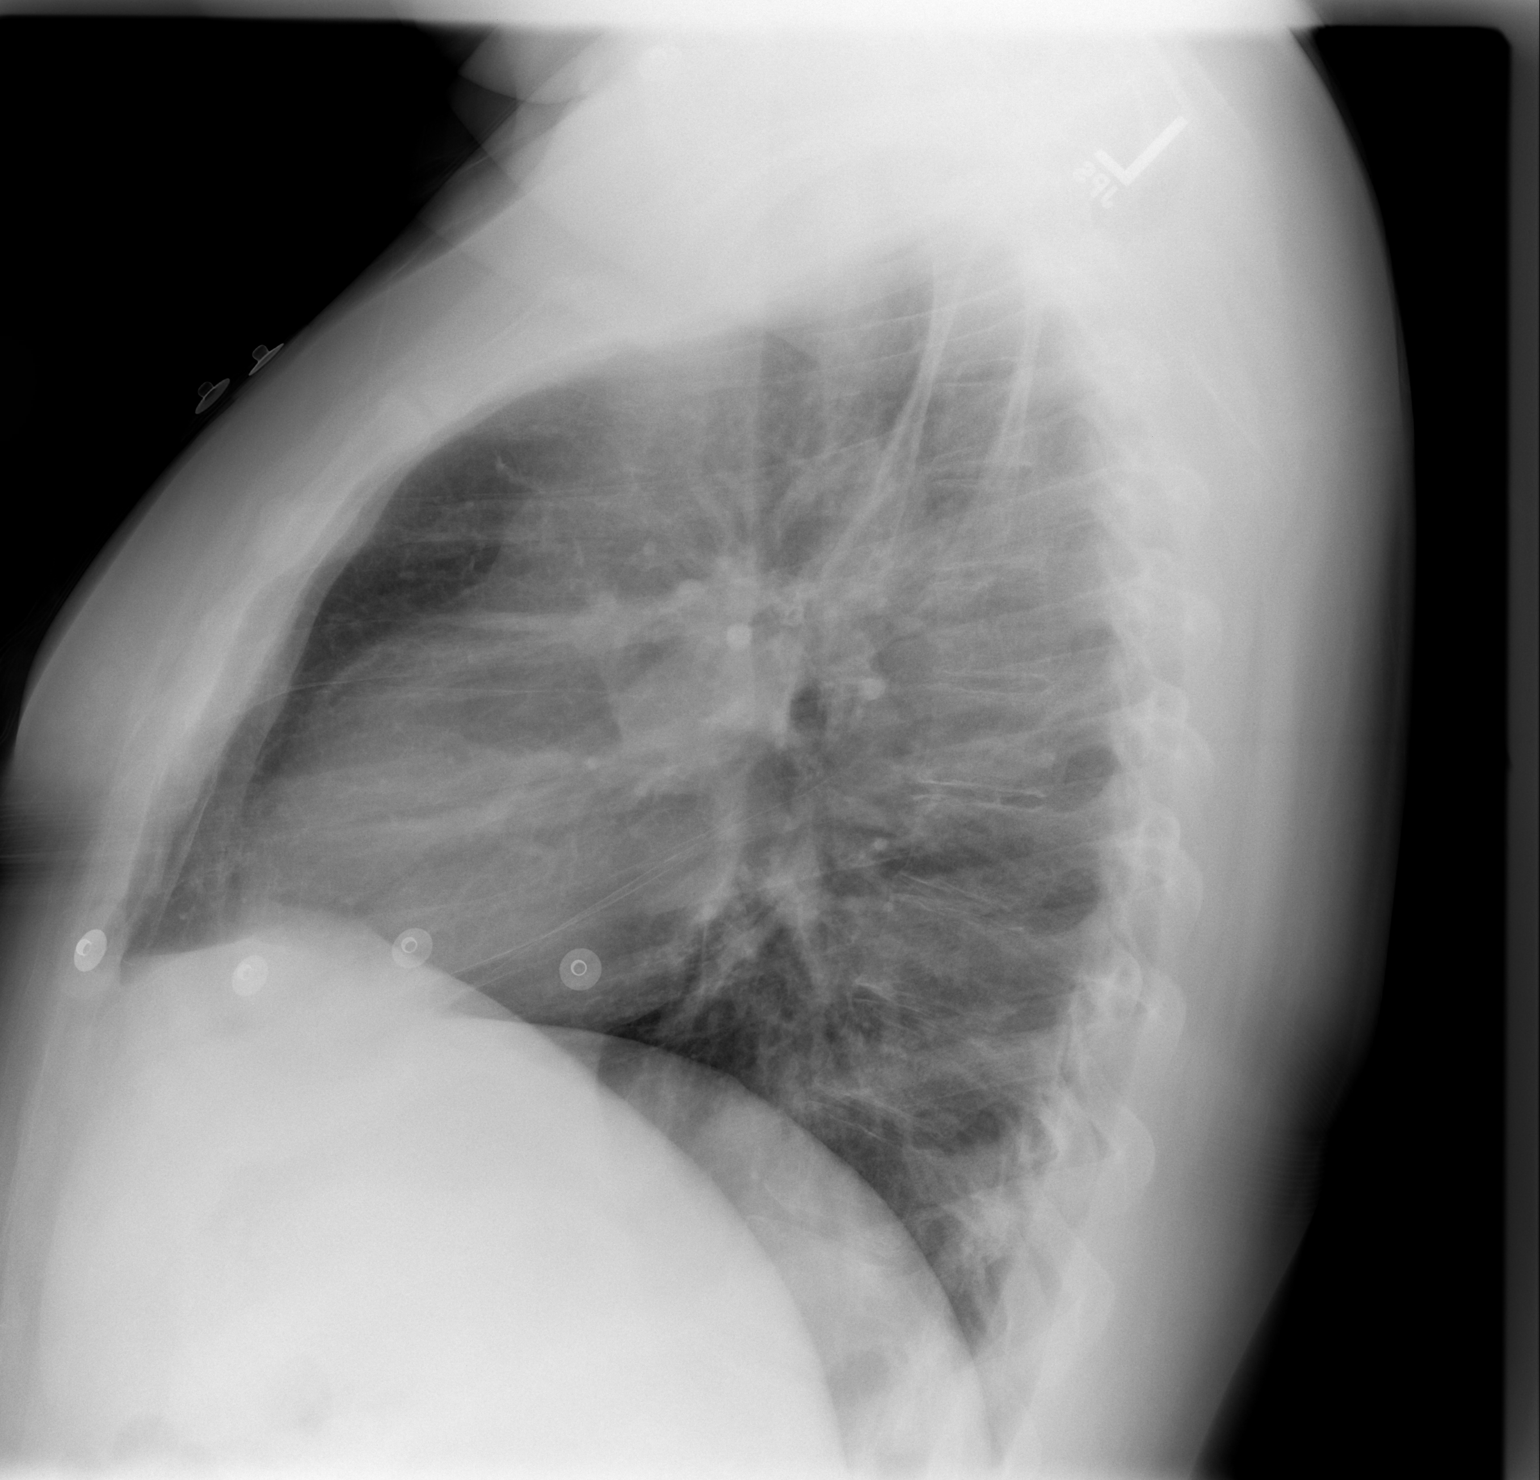

[2 of 2 positions shown; findings below may reference images not displayed]

FINDINGS: Minimal central bronchial thickening without hyperinflation, pneumonia, edema, effusion, or pneumothorax.  Normal heart size.
IMPRESSION: Minimal bronchial thickening centrally.

## 2008-12-22 IMAGING — CT CT HEAD W/O CM
1 series · 16 of 30 positions shown, 20 images · IV contrast (agent unspecified)
Comparison: None. The report for an exam from 06/21/2002 has been reviewed.

CLINICAL DATA: Headache
TECHNIQUE: 5mm collimated images were obtained from the base of the skull
through the vertex according to standard protocol without contrast.

HEAD CT WITHOUT CONTRAST:

[Series 2: head routine 4.8 h47s · axial · 0.42mm/px · z∈[-139,+18]mm · 16 of 36 slices shown, 20 images]
[im 2/36  brain]
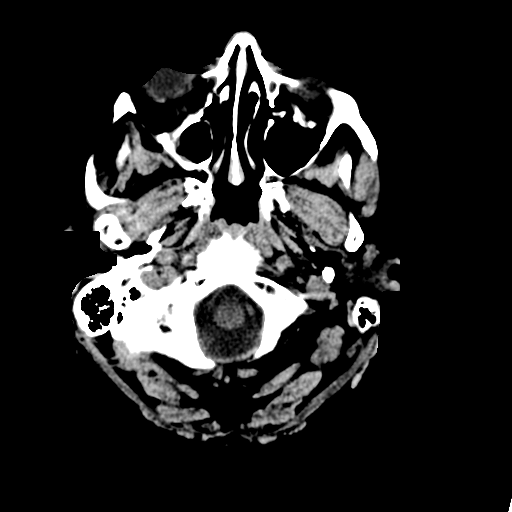
[im 2/36  bone]
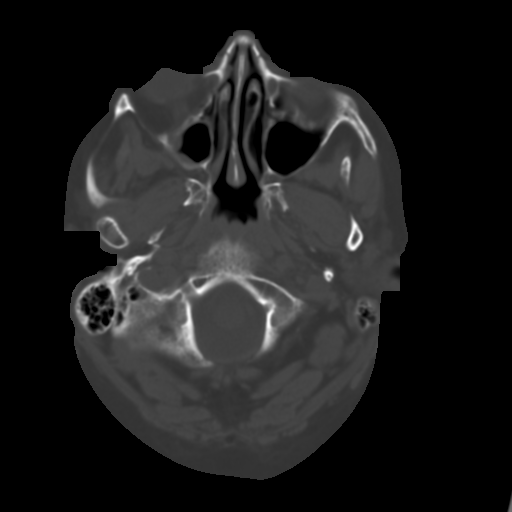
[im 4/36  brain]
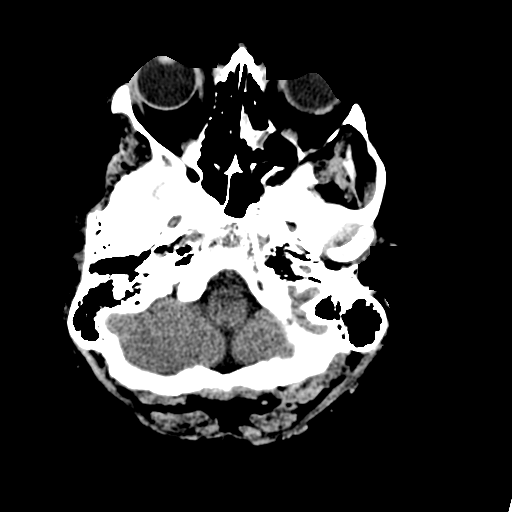
[im 7/36  brain]
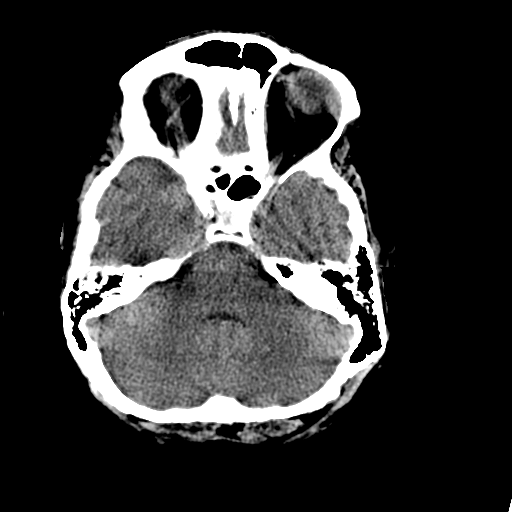
[im 9/36  brain]
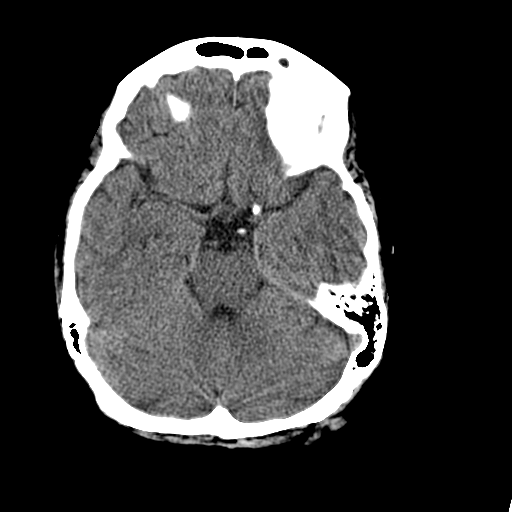
[im 10/36  brain]
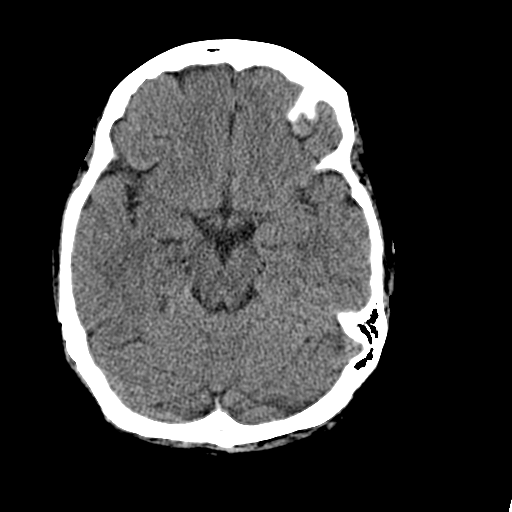
[im 10/36  bone]
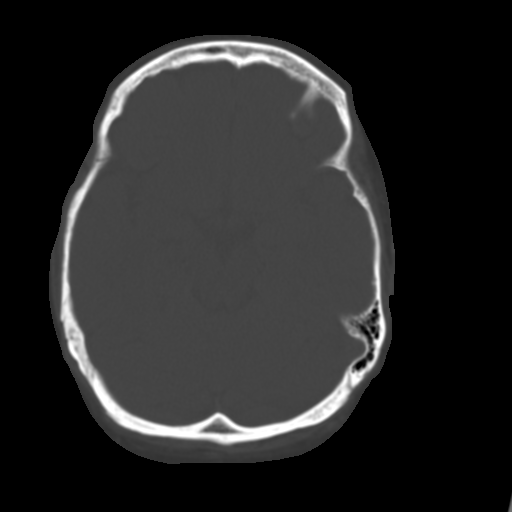
[im 13/36  brain]
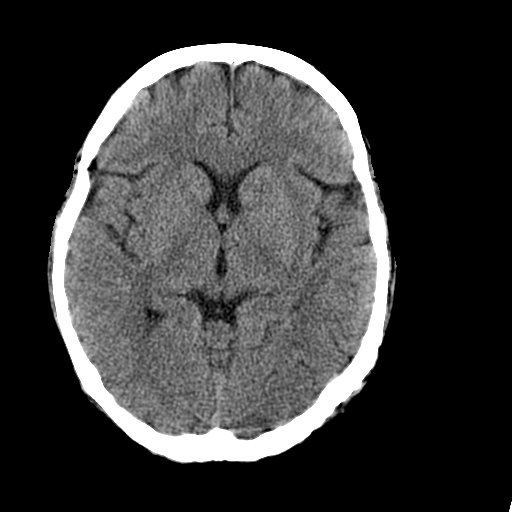
[im 15/36  brain]
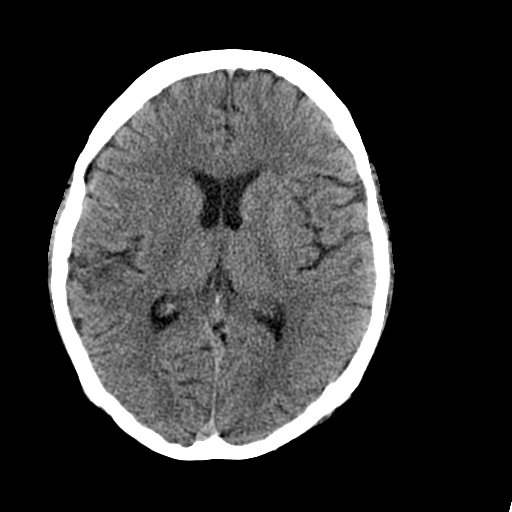
[im 17/36  brain]
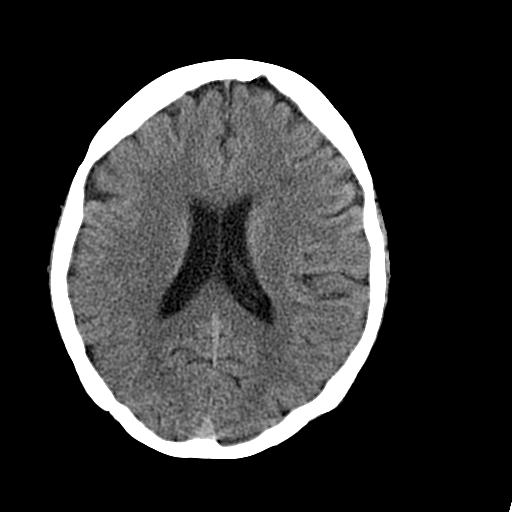
[im 19/36  brain]
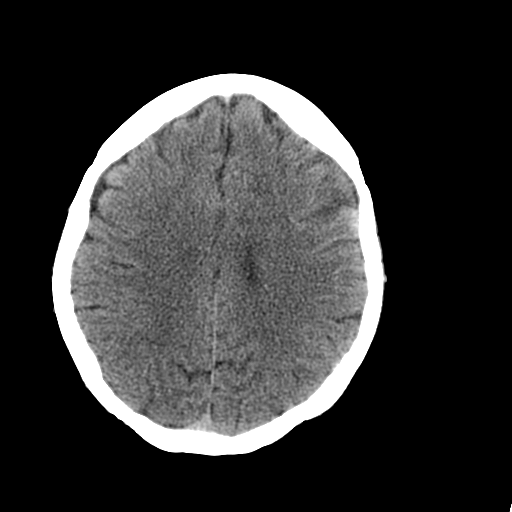
[im 19/36  bone]
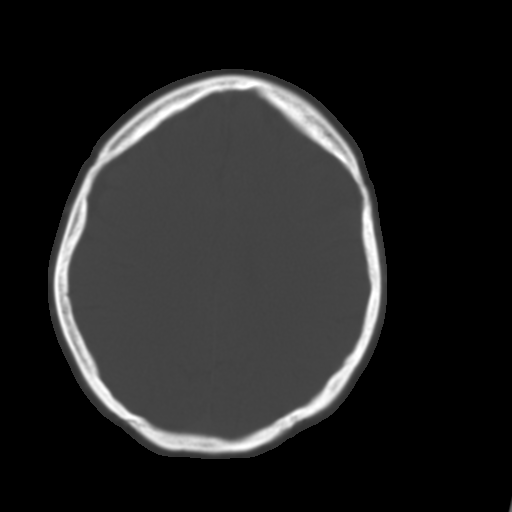
[im 21/36  brain]
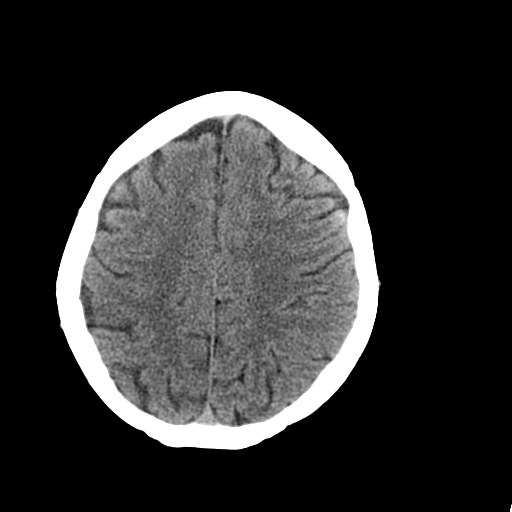
[im 23/36  brain]
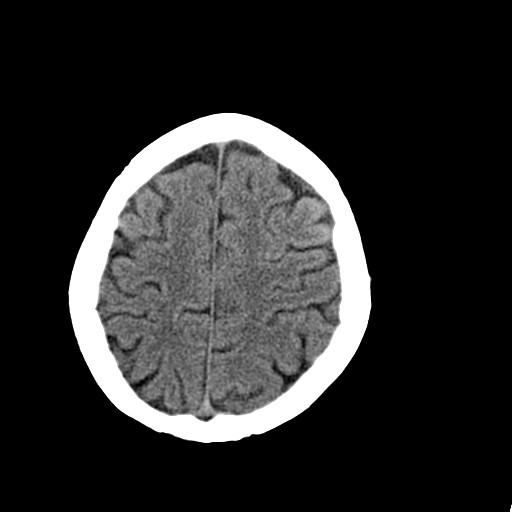
[im 26/36  brain]
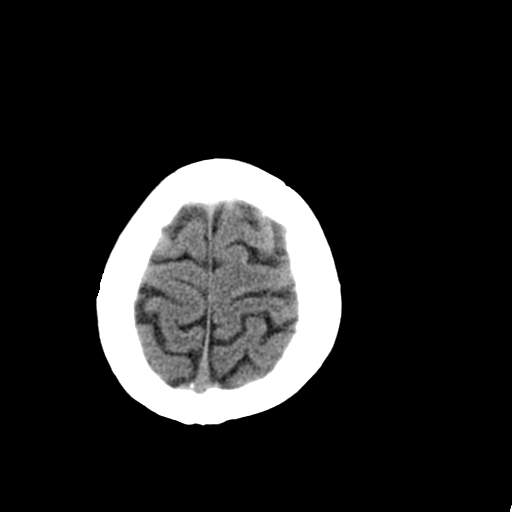
[im 27/36  brain]
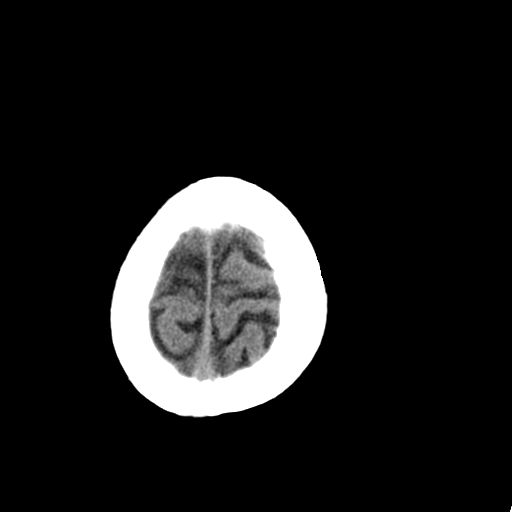
[im 27/36  bone]
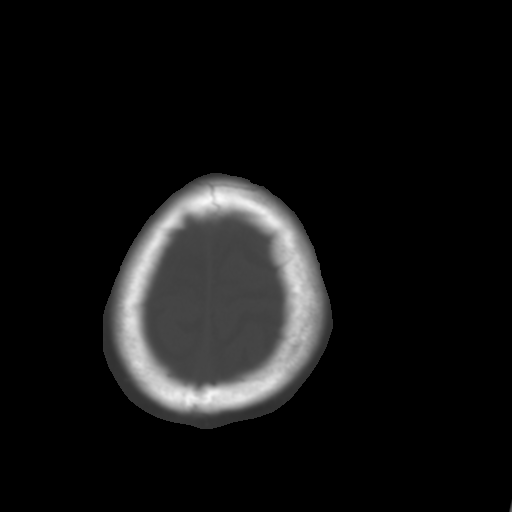
[im 29/36  brain]
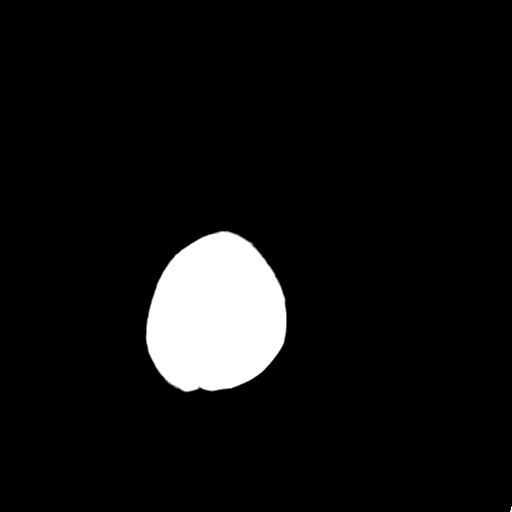
[im 32/36  brain]
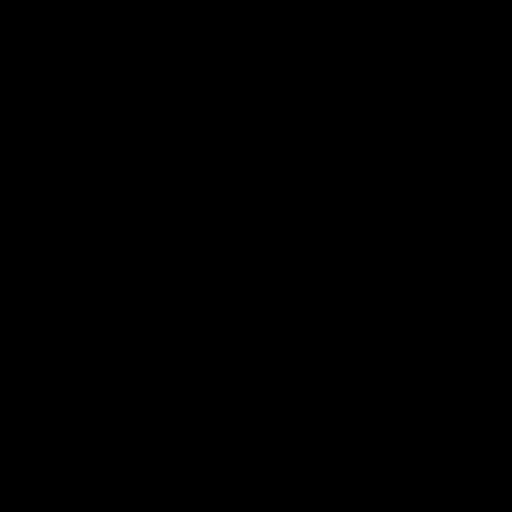
[im 34/36  brain]
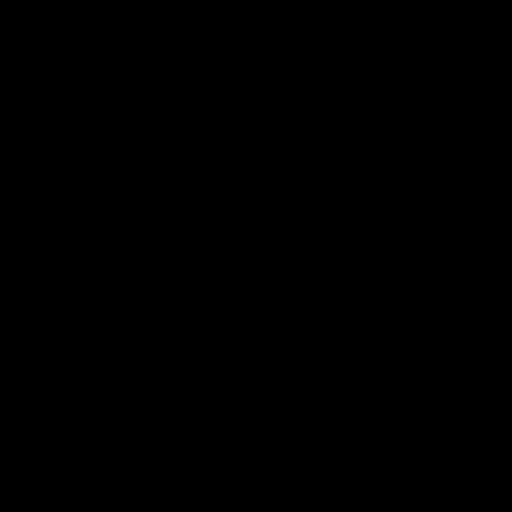

[16 of 30 positions shown; findings below may reference images not displayed]

FINDINGS: There is no evidence for acute hemorrhage, hydrocephalus, mass/mass-effect or
abnormal extra-axial fluid collection.  No definite CT evidence for acute
ischemia.

Chronic sinusitis is seen in the right maxillary sinus and scattered left
ethmoid air cells.
IMPRESSION: No acute intracranial abnormality. 

Paranasal sinusitis.

## 2008-12-24 IMAGING — XA IR ANGIO/A/V SHUNT*R*
1 series · 13 of 24 positions shown · non-contrast
Comparison: none

CLINICAL DATA: High venous pressure.
 RIGHT ARM AV SHUNTOGRAM AND VENOUS ANGIOPLASTY ? 03/08/07 AT 9422 HOURS:
 AV SHUNTOGRAM:
 Procedure:  The right arm was prepped and draped in a sterile fashion. An 18 gauge Angiocath was inserted into the AV graft. Contrast was injected.  No complications.

[Series 1: run · 13 of 39 slices shown]
[im 1/39]
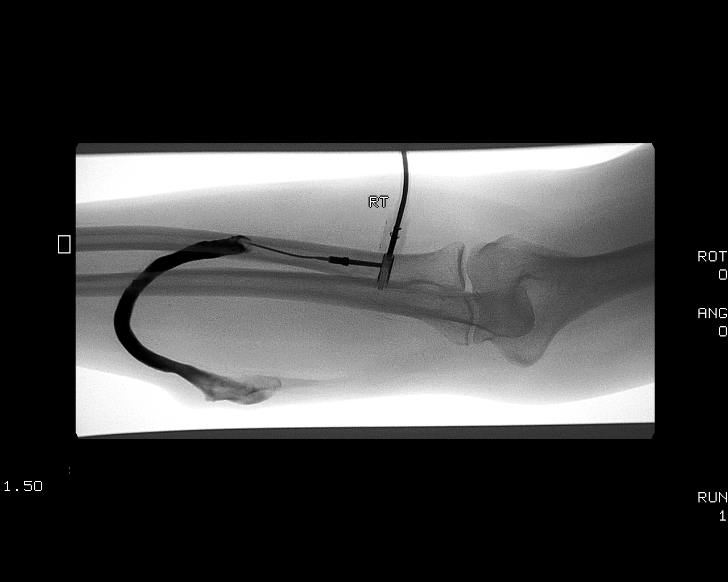
[im 4/39]
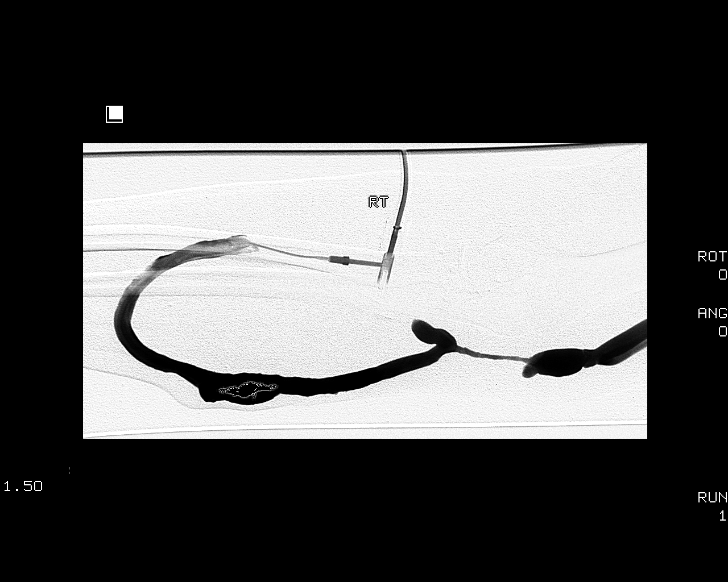
[im 7/39]
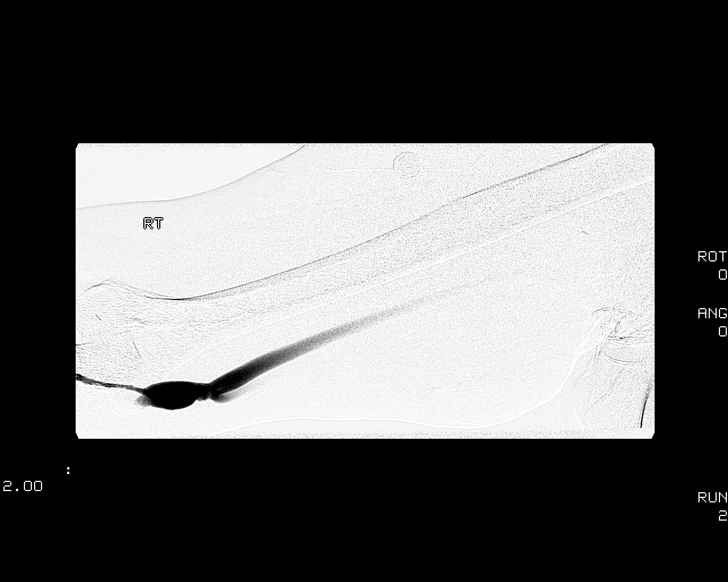
[im 10/39]
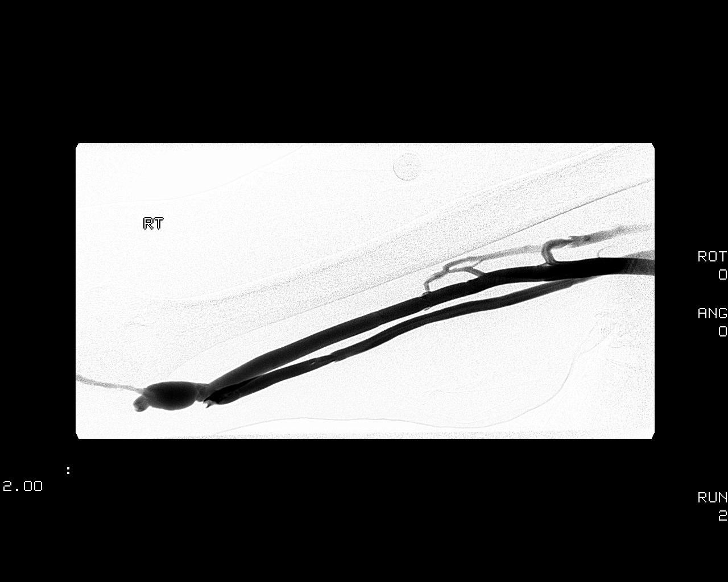
[im 14/39]
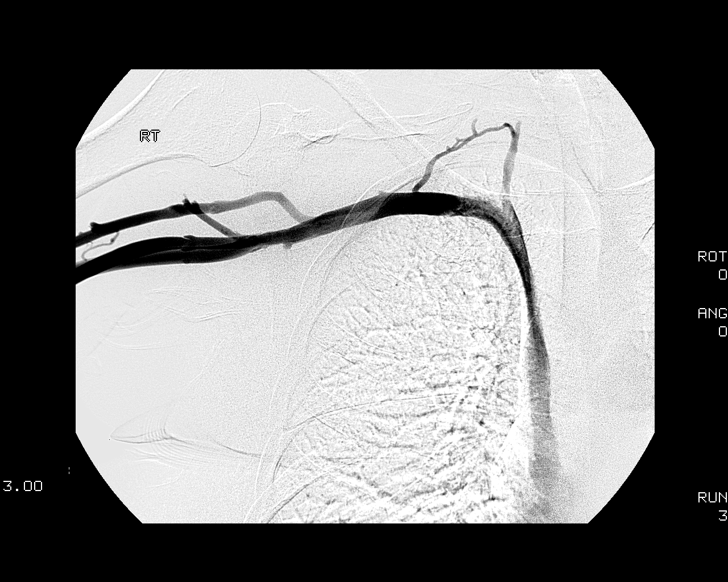
[im 17/39]
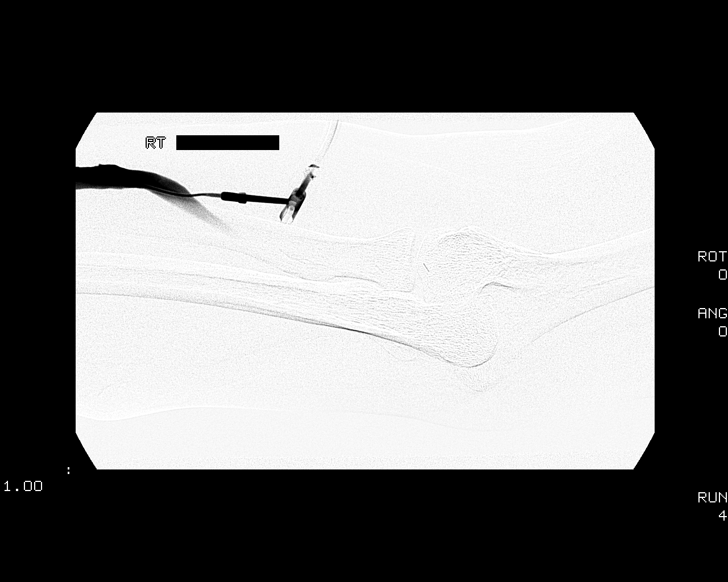
[im 20/39]
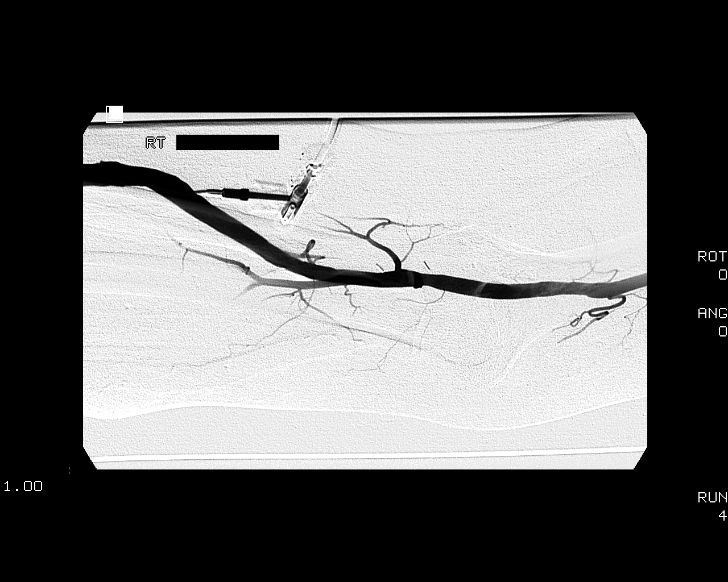
[im 22/39]
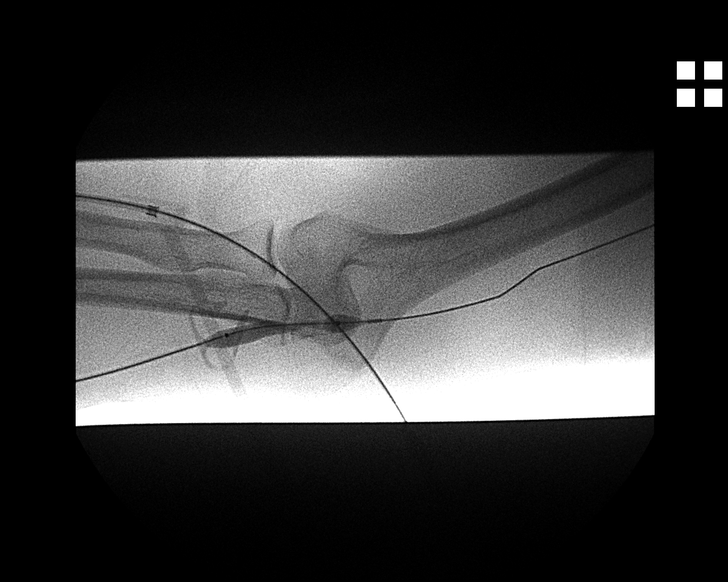
[im 25/39]
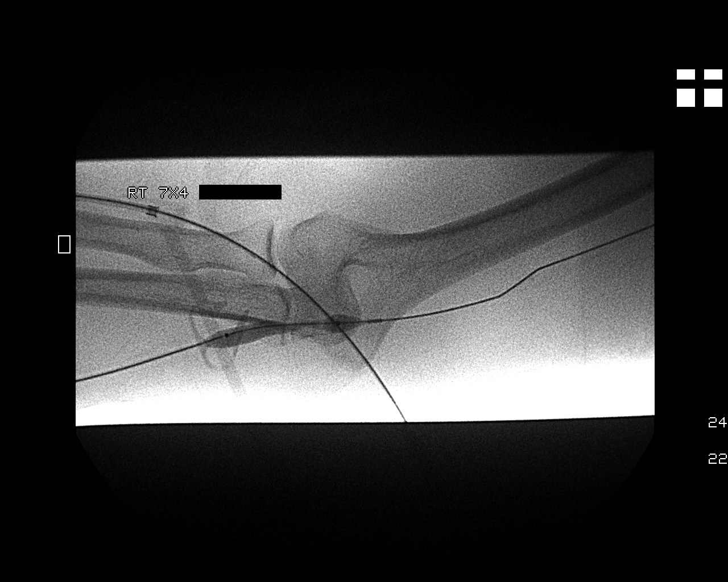
[im 29/39]
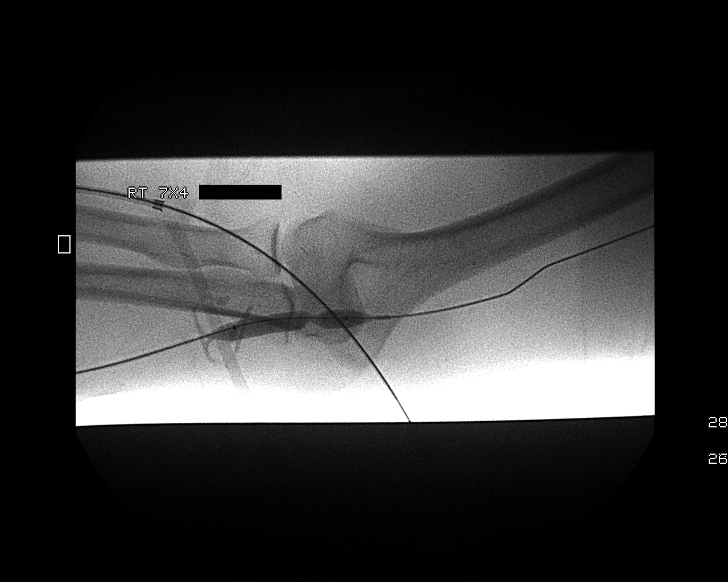
[im 32/39]
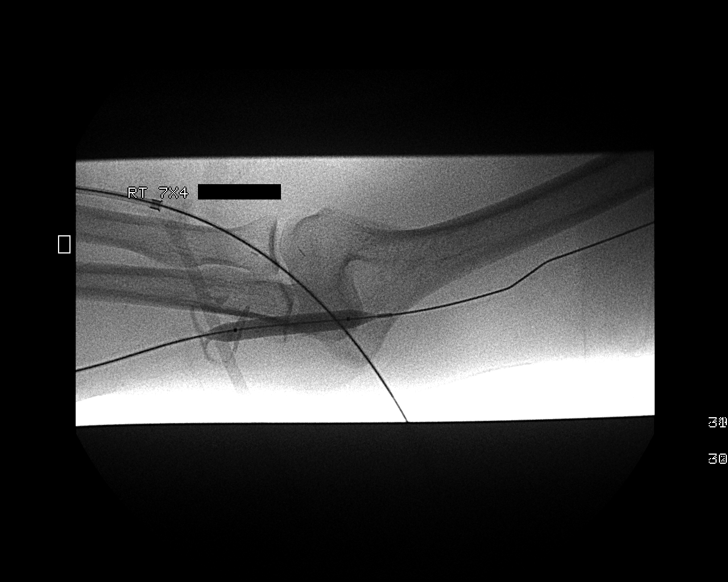
[im 35/39]
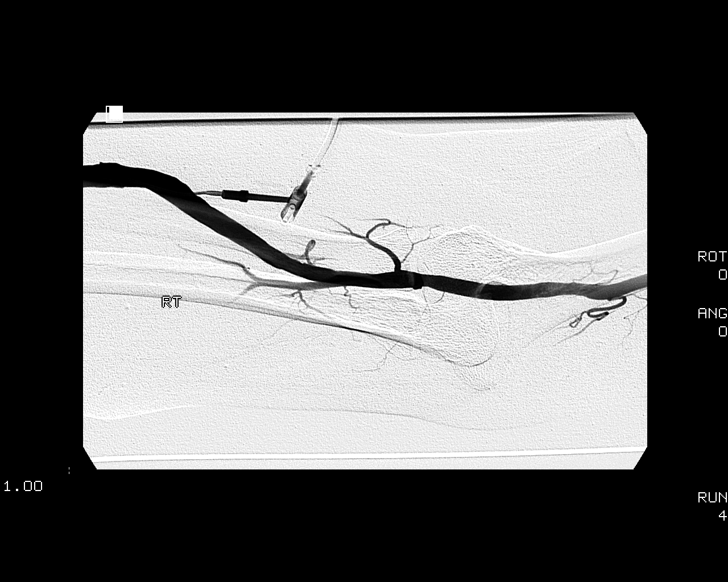
[im 39/39]
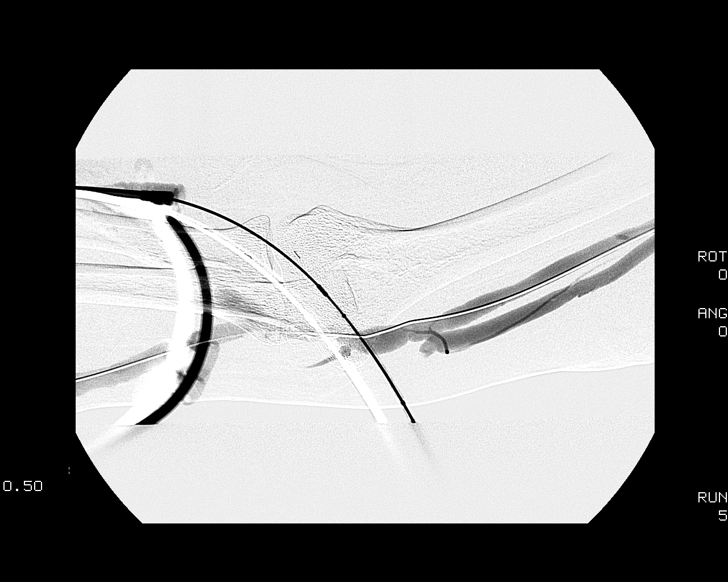

[13 of 24 positions shown; findings below may reference images not displayed]

FINDINGS: The central venous structures, graft, and arterial anastomosis are patent. There is a severe stenosis in the venous anastomosis.
IMPRESSION: Venous anastomosis stenosis.
 VENOUS ANGIOPLASTY:
 Procedure:  Lidocaine was utilized for local anesthesia.   The Angiocath was exchanged over a Bentson wire for a 6 French sheath. The venous anastomosis was dilated to 7 mm.  Repeat imaging demonstrates wide patency.  The sheath was removed and hemostasis was achieved with an 0 Prolene figure-eight stitch.  No complications.
FINDINGS: Post-angioplasty imaging demonstrates patency of the venous anastomosis.
IMPRESSION: Successful dilatation of the venous anastomosis to 7 mm.

## 2008-12-24 IMAGING — CR DG LUMBAR SPINE COMPLETE 4+V
5 series · 5 of 5 positions shown · non-contrast
Comparison: none

CLINICAL DATA: Low back pain.  Diabetes.  
 LUMBAR SPINE ? 5 VIEW:

[t l-spine a.p.]
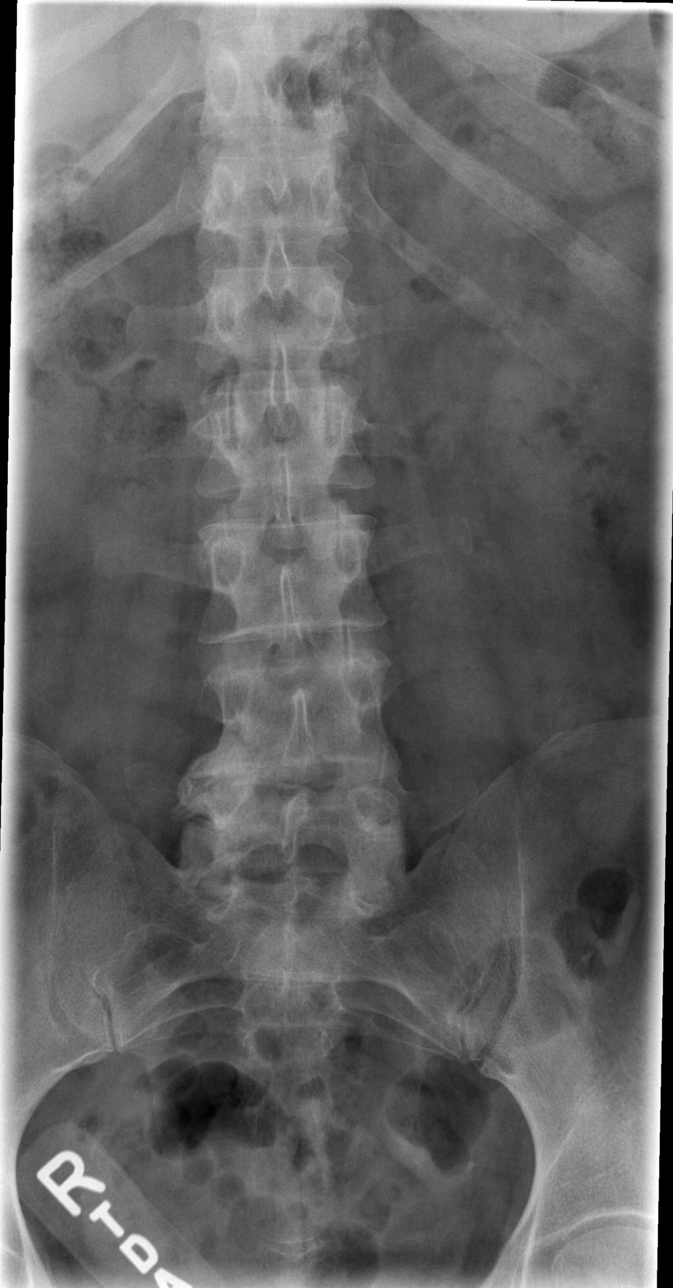

[t l-spine oblique exposure (1 of 2)]
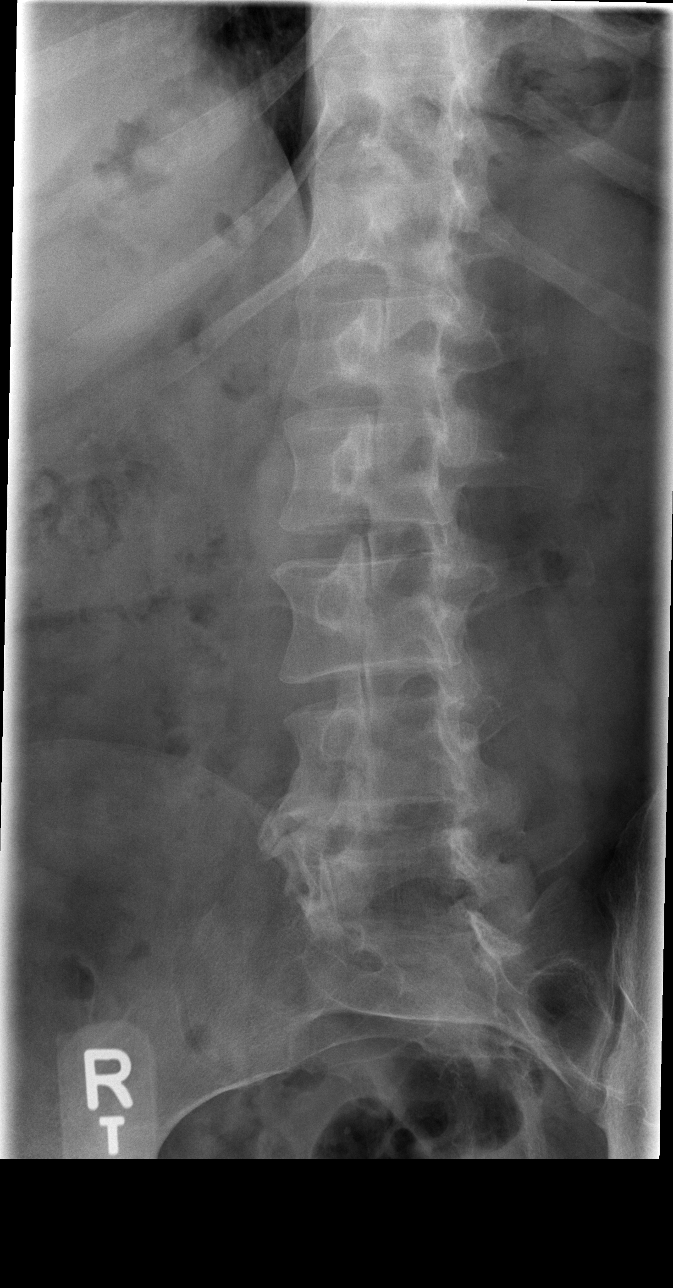

[t l-spine oblique exposure (2 of 2)]
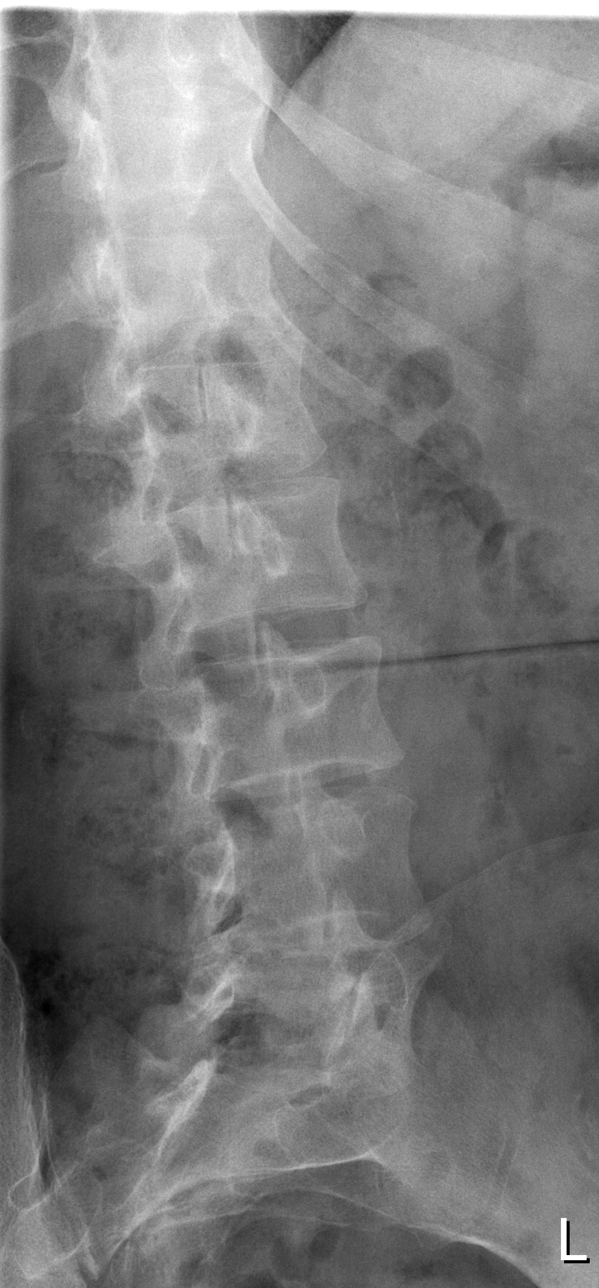

[t l-spine lat]
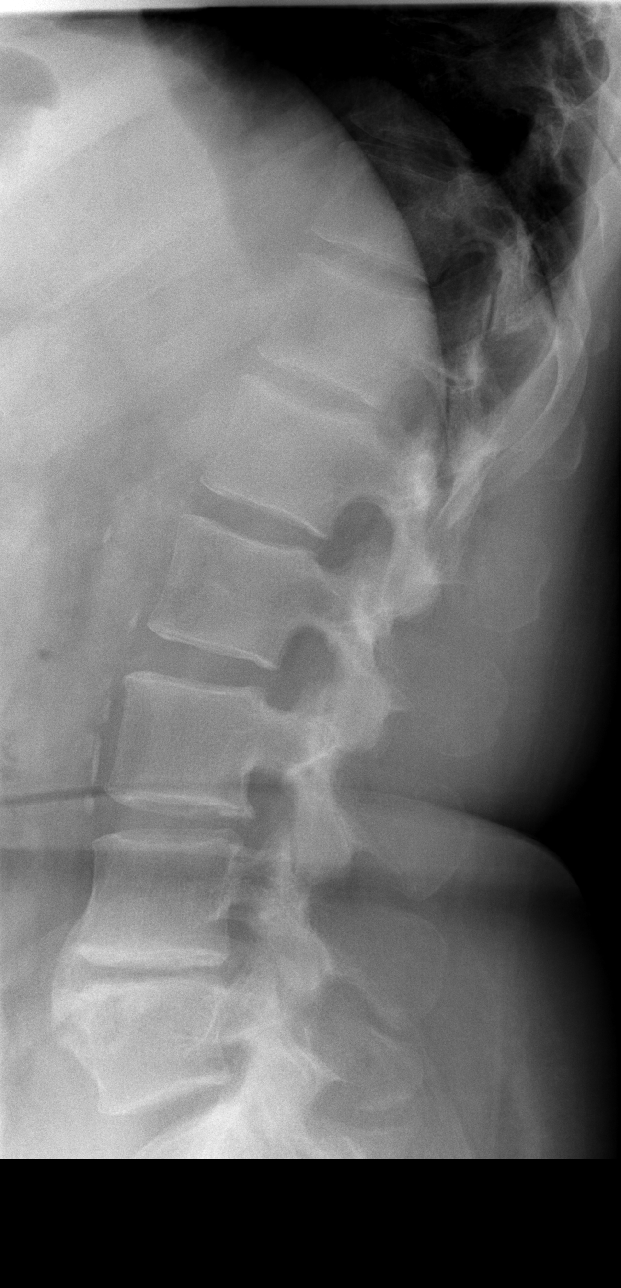

[t l-spine l5-s1 spot]
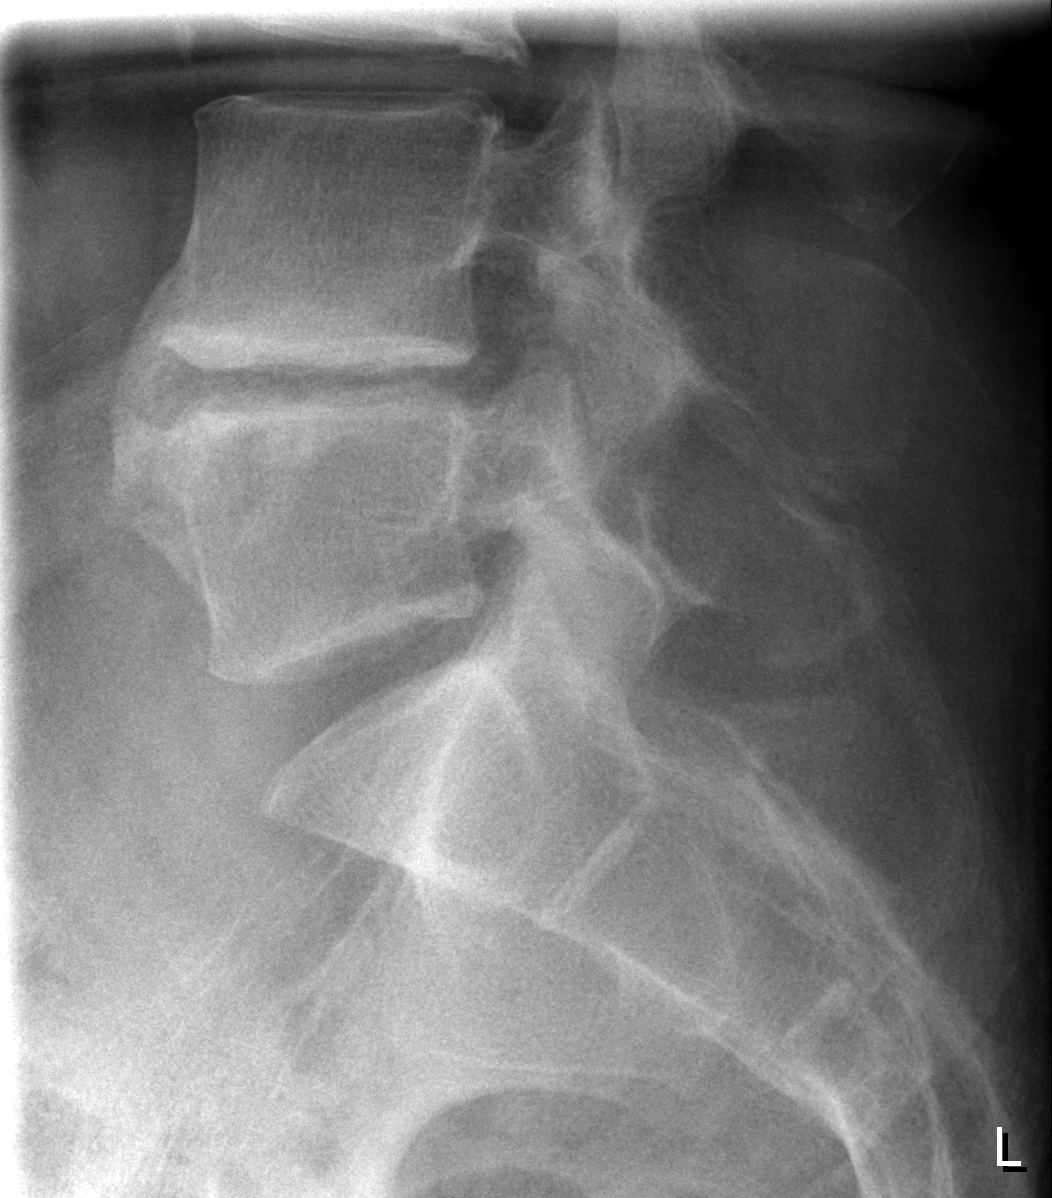

[5 of 5 positions shown; findings below may reference images not displayed]

FINDINGS: Five lumbar type vertebral bodies.  There is no abnormality at L2-3 or above.
 L3-4:  There is mild disk space narrowing with small endplate osteophytes.
 L4-5:  There are chronic degenerative changes with disk space narrowing.  There appear to be bridging osteophytes at this level, and the level may be functionally fused.  
 At L5-S1, there is mild disk space narrowing and facet degeneration.
IMPRESSION: 1.  Chronic degenerative changes at L4-5 with disk space narrowing and bridging osteophytes anteriorly.
 2.  Mild disk space narrowing at L3-4 and L5-S1. Mild lower lumbar facet degeneration.

## 2009-06-03 ENCOUNTER — Ambulatory Visit: Payer: Self-pay | Admitting: Infectious Diseases

## 2009-06-03 ENCOUNTER — Inpatient Hospital Stay (HOSPITAL_COMMUNITY): Admission: EM | Admit: 2009-06-03 | Discharge: 2009-06-06 | Payer: Self-pay | Admitting: Emergency Medicine

## 2009-06-04 ENCOUNTER — Encounter (INDEPENDENT_AMBULATORY_CARE_PROVIDER_SITE_OTHER): Payer: Self-pay | Admitting: Internal Medicine

## 2009-08-15 ENCOUNTER — Emergency Department (HOSPITAL_COMMUNITY): Admission: EM | Admit: 2009-08-15 | Discharge: 2009-08-15 | Payer: Self-pay | Admitting: Emergency Medicine

## 2009-09-07 ENCOUNTER — Inpatient Hospital Stay (HOSPITAL_COMMUNITY): Admission: EM | Admit: 2009-09-07 | Discharge: 2009-09-10 | Payer: Self-pay | Admitting: Emergency Medicine

## 2009-09-07 DIAGNOSIS — N289 Disorder of kidney and ureter, unspecified: Secondary | ICD-10-CM

## 2009-09-07 HISTORY — DX: Disorder of kidney and ureter, unspecified: N28.9

## 2009-10-02 ENCOUNTER — Encounter: Admission: RE | Admit: 2009-10-02 | Discharge: 2009-10-02 | Payer: Self-pay | Admitting: Nephrology

## 2009-10-29 ENCOUNTER — Ambulatory Visit (HOSPITAL_COMMUNITY): Admission: RE | Admit: 2009-10-29 | Discharge: 2009-10-29 | Payer: Self-pay | Admitting: Nephrology

## 2009-11-04 ENCOUNTER — Ambulatory Visit (HOSPITAL_COMMUNITY): Admission: RE | Admit: 2009-11-04 | Discharge: 2009-11-04 | Payer: Self-pay | Admitting: Nephrology

## 2009-11-06 IMAGING — XA IR ANGIO/A/V SHUNT*R*
1 series · 13 of 24 positions shown · non-contrast
Comparison: 03/08/2007

01/23/08 – DUPLICATE COPY for exam association in [facility] – No change from original report.
CLINICAL DATA: end stage renal disease, flow problems during
 dialysis

 DIALYSIS SHUNTOGRAM
 VENOUS ANGIOPLASTY
TECHNIQUE: An 18-gauge angiocatheter was placed antegrade into
 the arterial limb of the patient's right forearm synthetic loopAV
 hemodialysis fistula for dialysis fistulography. The angiocatheter
 and surrounding skin were then prepped with Betadine, draped in
 usual sterile fashion, infiltrated locally with 1% lidocaine. The
 angiocatheter was exchanged over a Benson wire for a 6 French
 vascular sheath, through which a 7 mm x 4 cm Conquest angioplasty
 balloon was advanced to the level of a recurrent outflow vein
 stenosis for venous angioplasty using 60 second overlapping
 inflations at 30 atmospheres. After follow-up venography, the
 catheter, sheath, and guidewire were removed and hemostasis
 achieved with a 2-0 Ethilon purse-string suture. No immediate
 complication.

[Series 1: run · 13 of 33 slices shown]
[im 1/33]
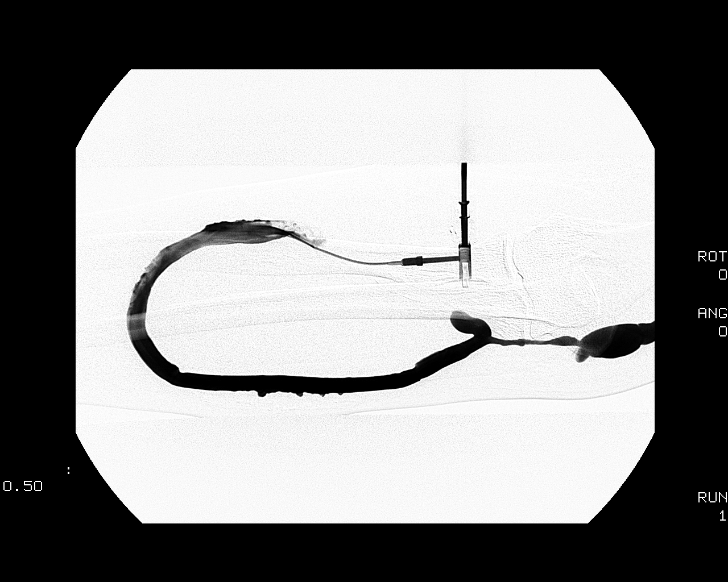
[im 3/33]
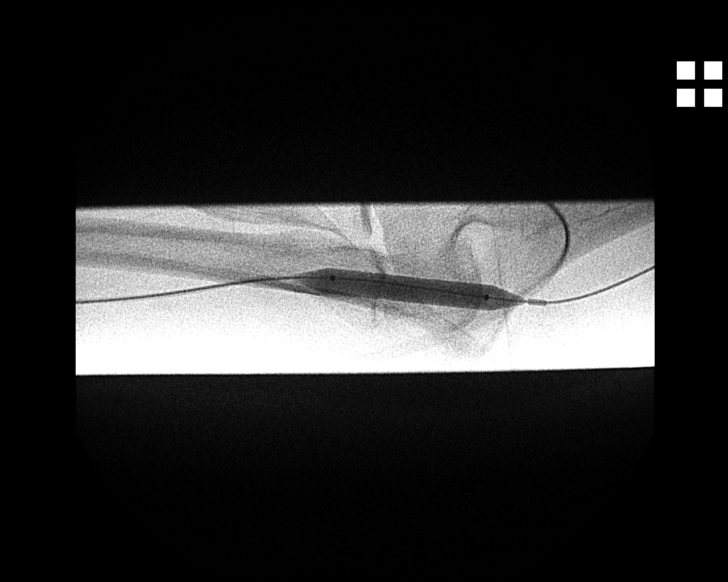
[im 6/33]
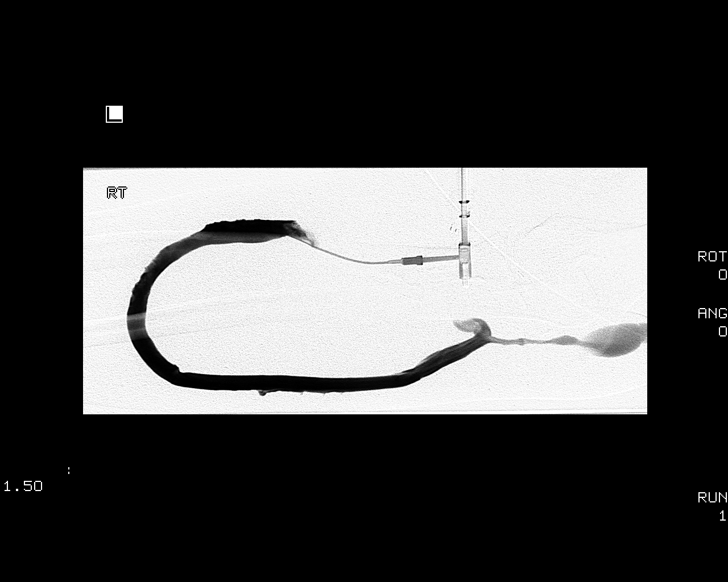
[im 9/33]
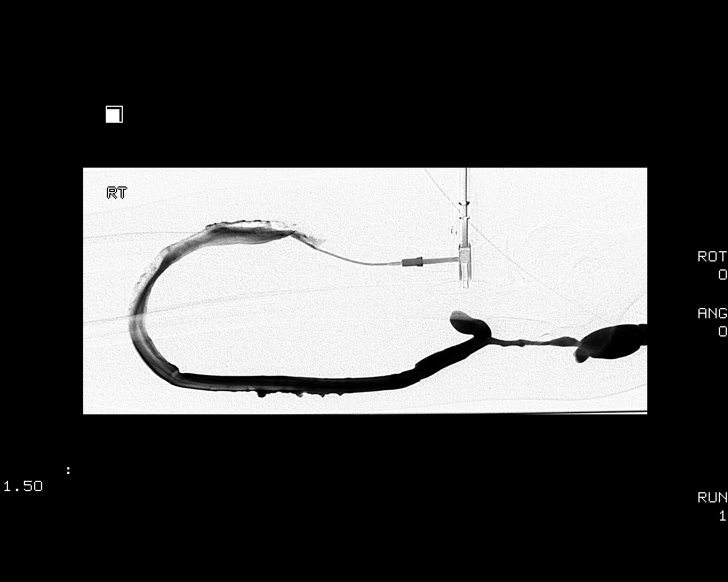
[im 12/33]
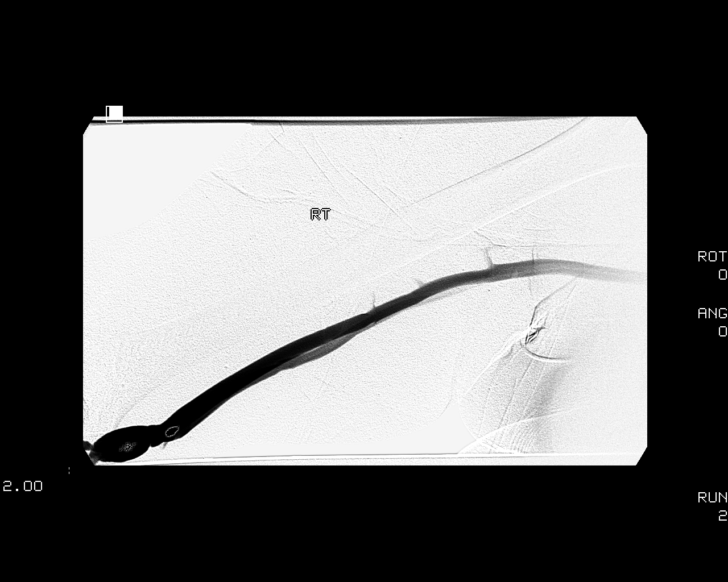
[im 14/33]
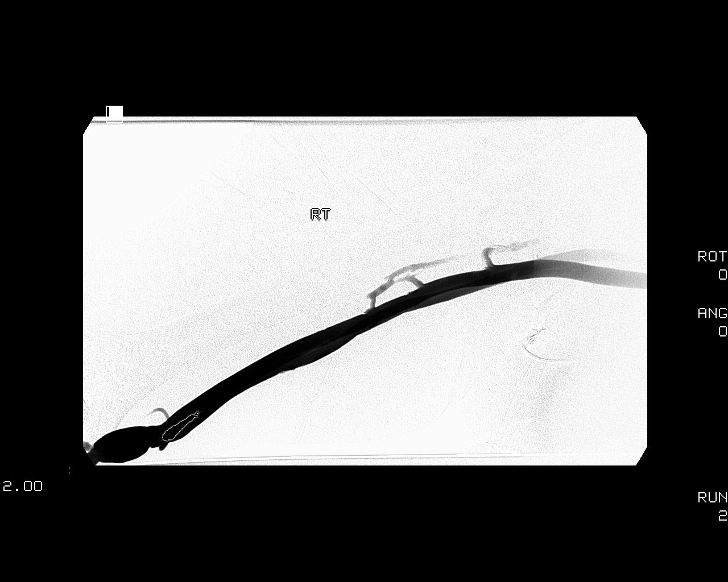
[im 17/33]
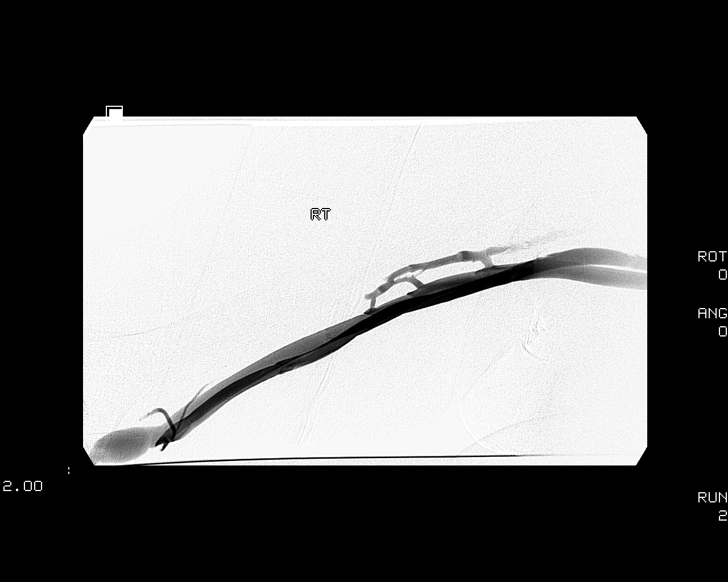
[im 19/33]
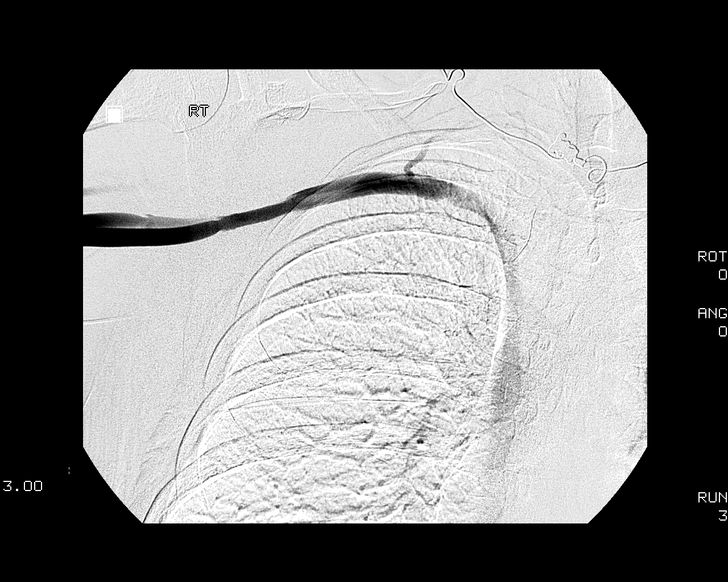
[im 21/33]
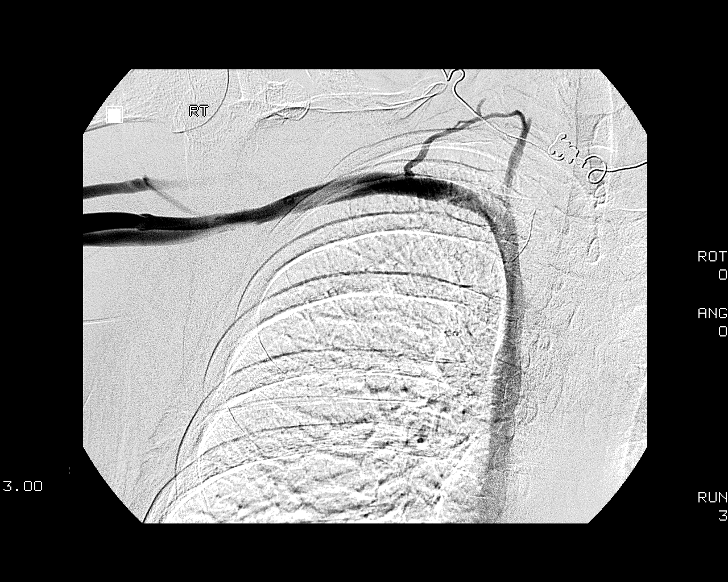
[im 24/33]
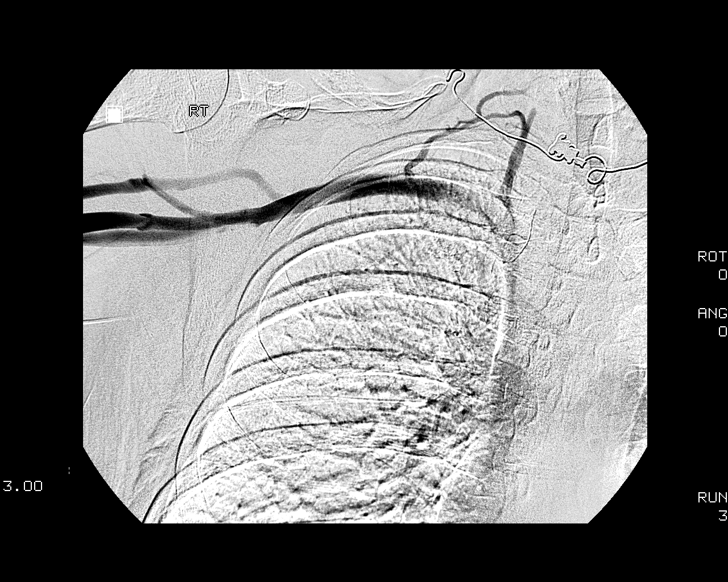
[im 27/33]
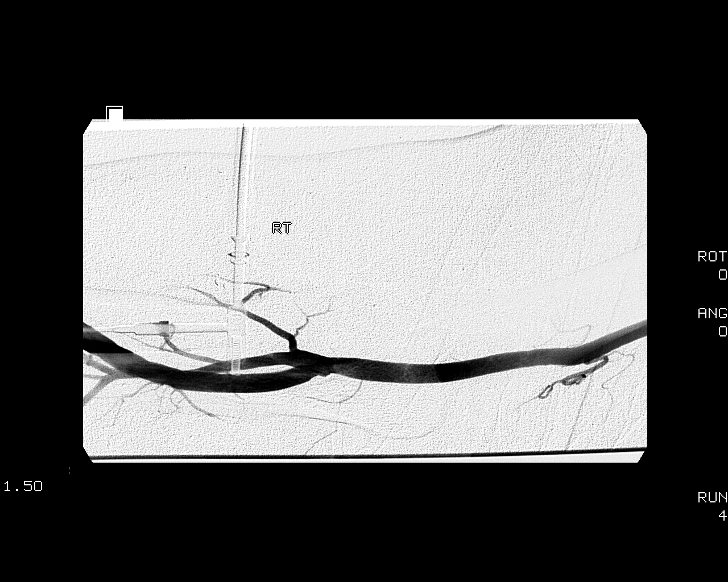
[im 30/33]
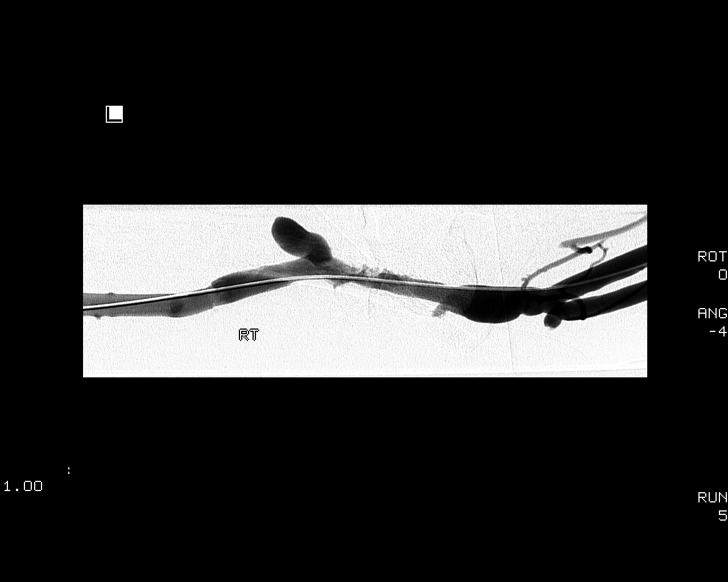
[im 33/33]
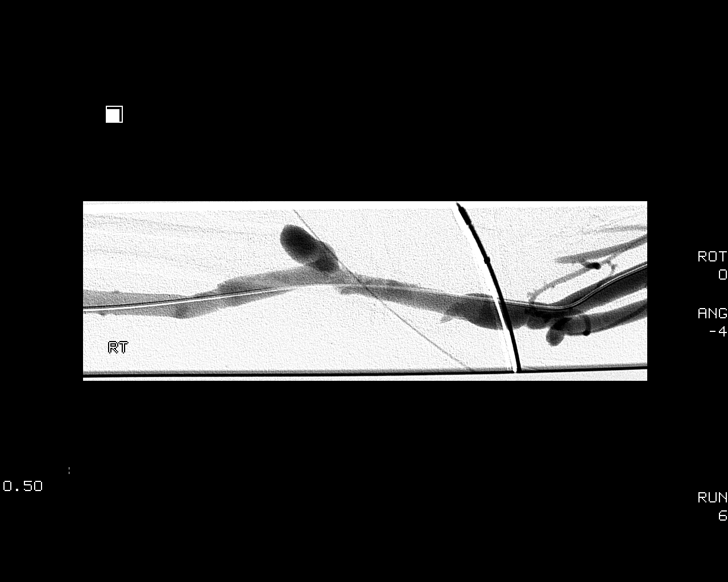

[13 of 24 positions shown; findings below may reference images not displayed]

FINDINGS: The arterial anastomosis with the brachial artery is
 widely patent. There is minimal irregularity of the arterial and
 venous limbs of the graft without significant stenosis. The venous
 anastomosis is widely patent. However, just central to the venous
 anastomosis is a segment of recurrent outflow vein stenosis
 spanning a length of approximately 3 cm. This responded well to 7
 mm balloon angioplasty. Final fistulogram shows no significant
 residual stenosis, recoil, extravasation, dissection, or other
 apparent complication. The patient tolerated procedure well, with
 no immediate complication.

 IMPRESSION

 1. Recurrent outflow vein stenosis, with good response to 7 mm
 balloon angioplasty

## 2009-11-23 ENCOUNTER — Inpatient Hospital Stay (HOSPITAL_COMMUNITY): Admission: EM | Admit: 2009-11-23 | Discharge: 2009-11-26 | Payer: Self-pay | Admitting: Emergency Medicine

## 2009-12-16 HISTORY — PX: KIDNEY TRANSPLANT: SHX239

## 2010-01-19 IMAGING — CR DG CHEST 2V
3 series · 3 of 3 positions shown · non-contrast
Comparison: 04/16/2007

CLINICAL DATA: Preop for clotted graft

CHEST - 2 VIEW

[view not recorded (1 of 3)]
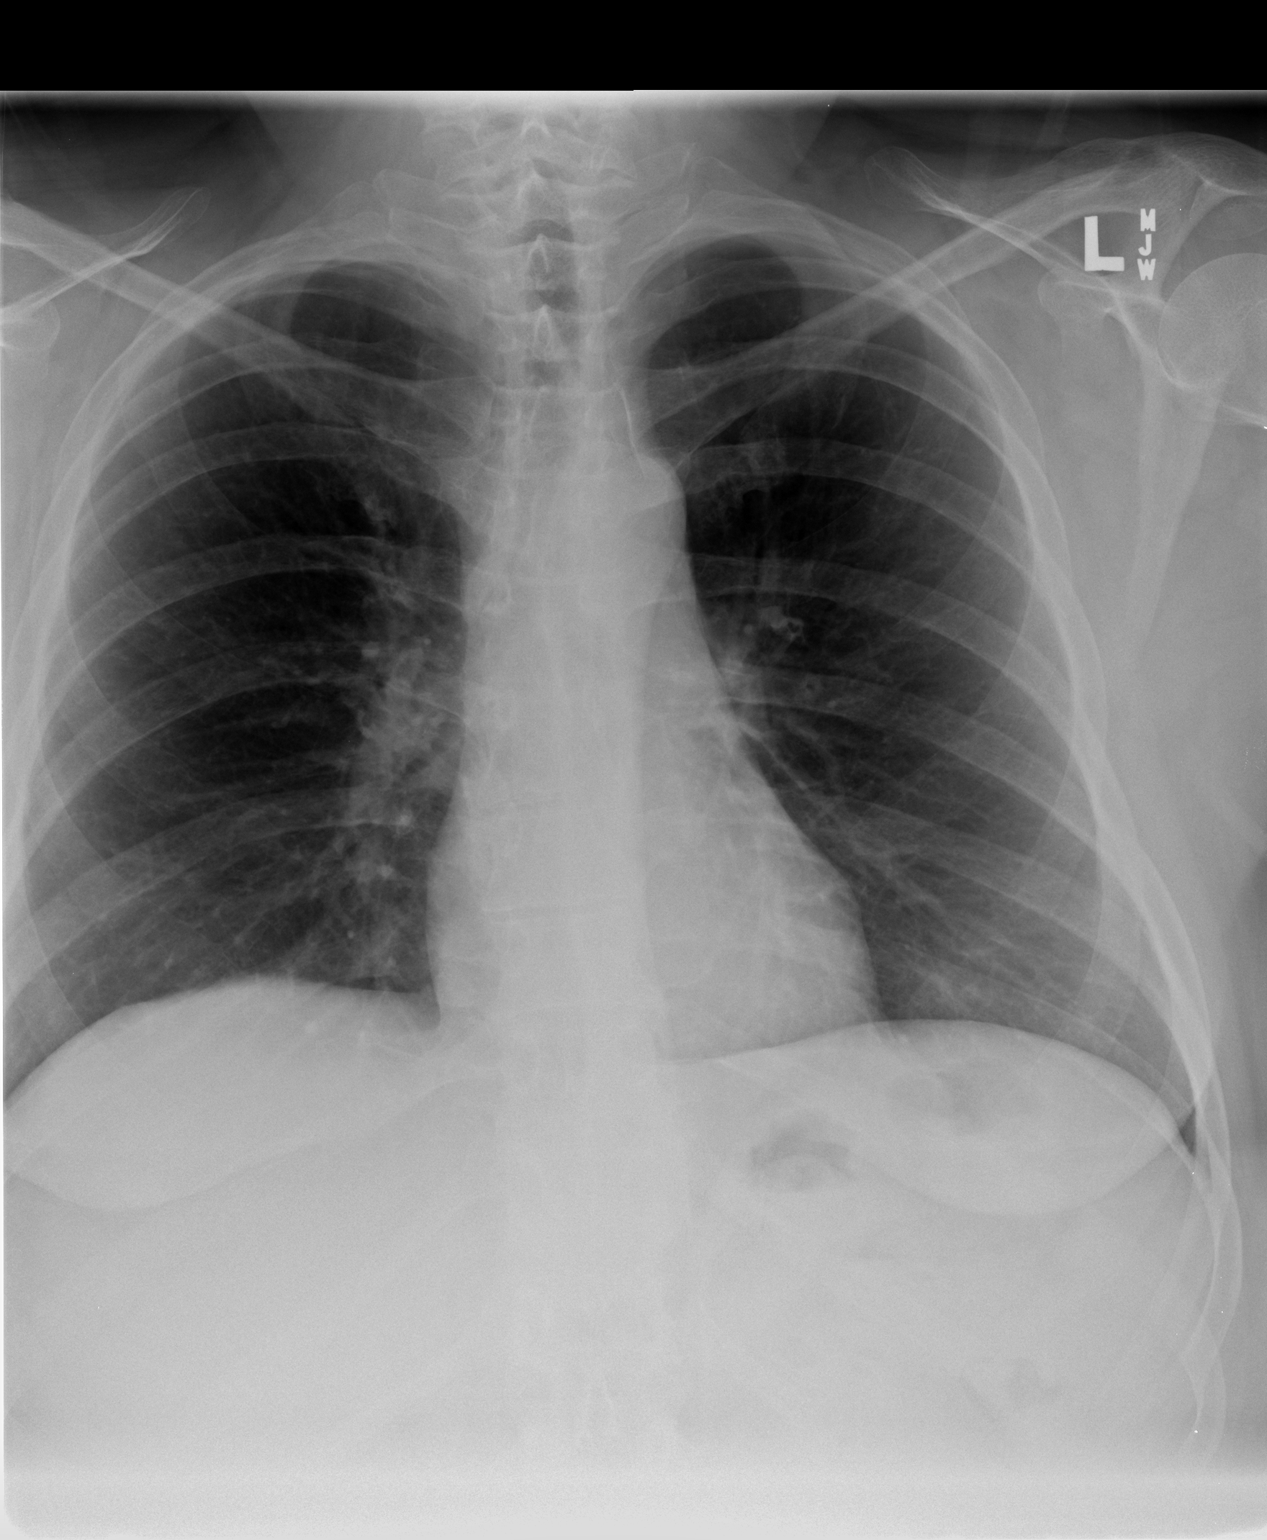

[view not recorded (2 of 3)]
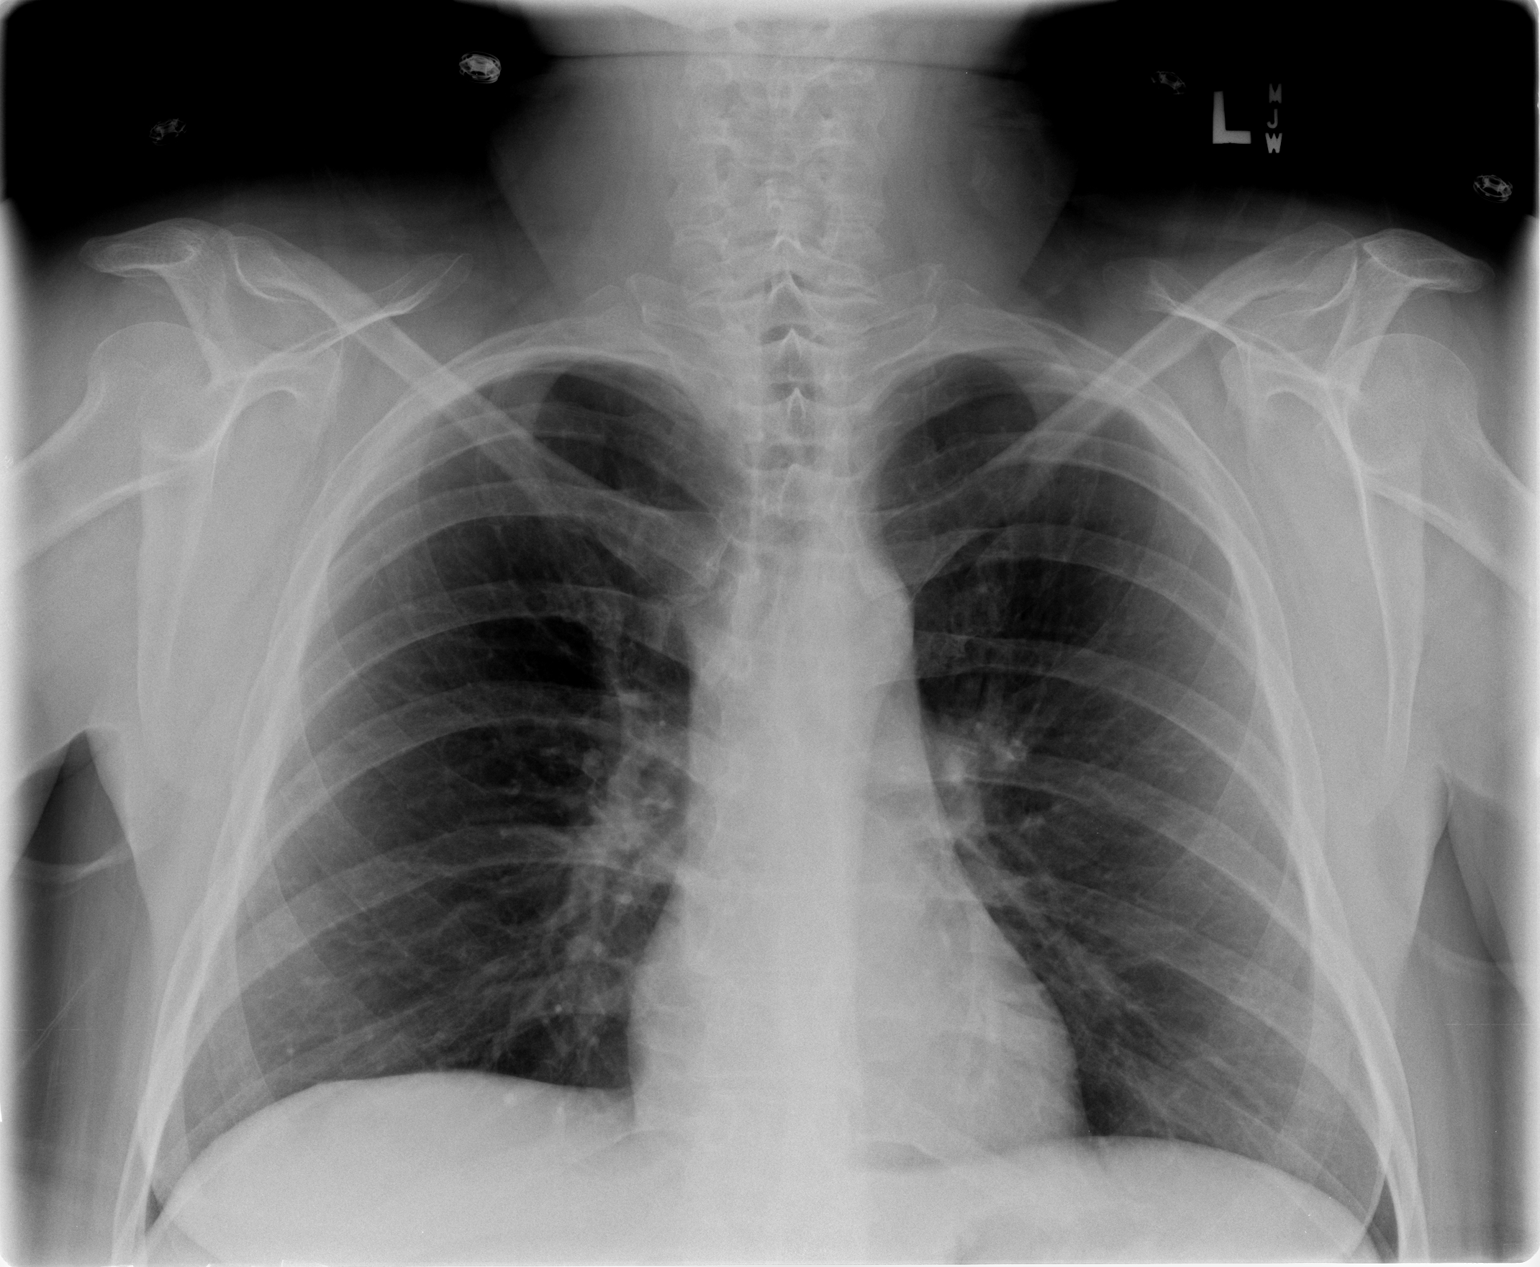

[view not recorded (3 of 3)]
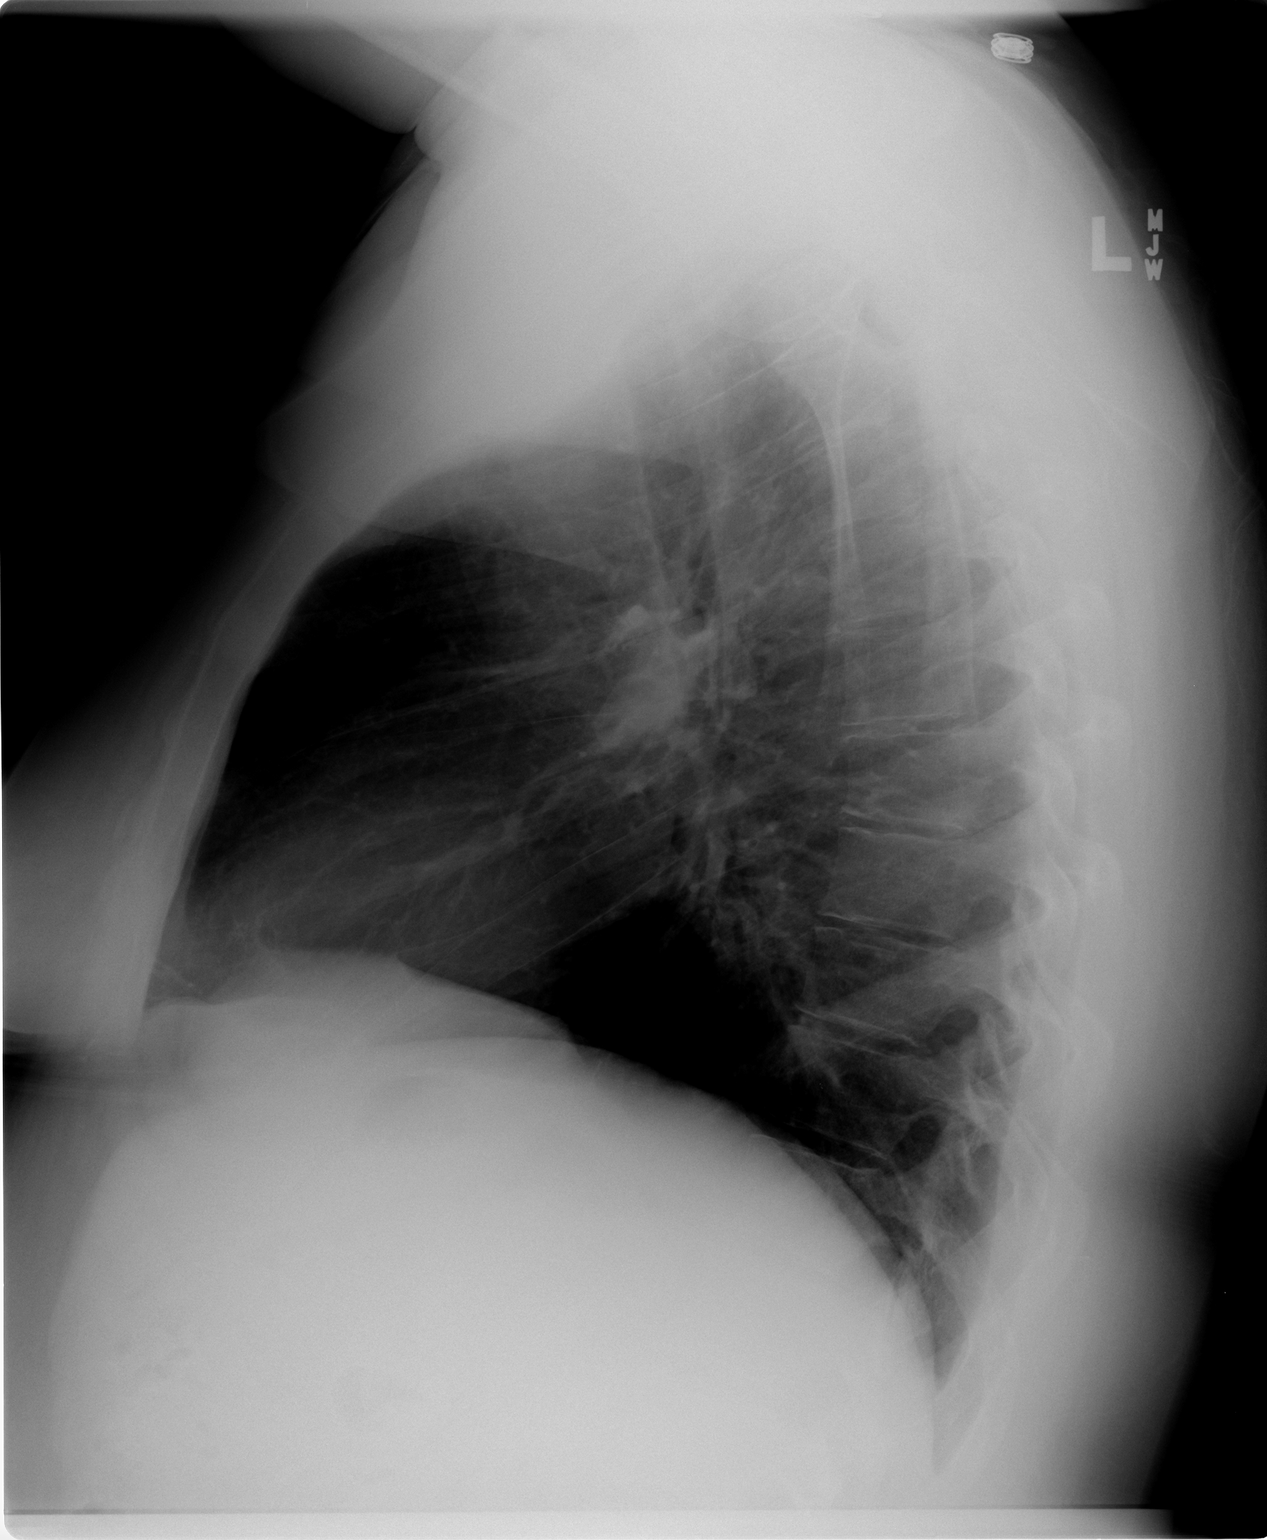

[3 of 3 positions shown; findings below may reference images not displayed]

FINDINGS: Heart and mediastinal contours normal.  Lungs clear.
Osseous structures intact.
IMPRESSION: No active disease.

## 2010-02-13 IMAGING — CR DG CHEST 2V
2 series · 2 of 2 positions shown · non-contrast
Comparison: PA and lateral chest 04/02/2008.

CLINICAL DATA: Shortness of breath.

CHEST - 2 VIEW

[view not recorded (1 of 2)]
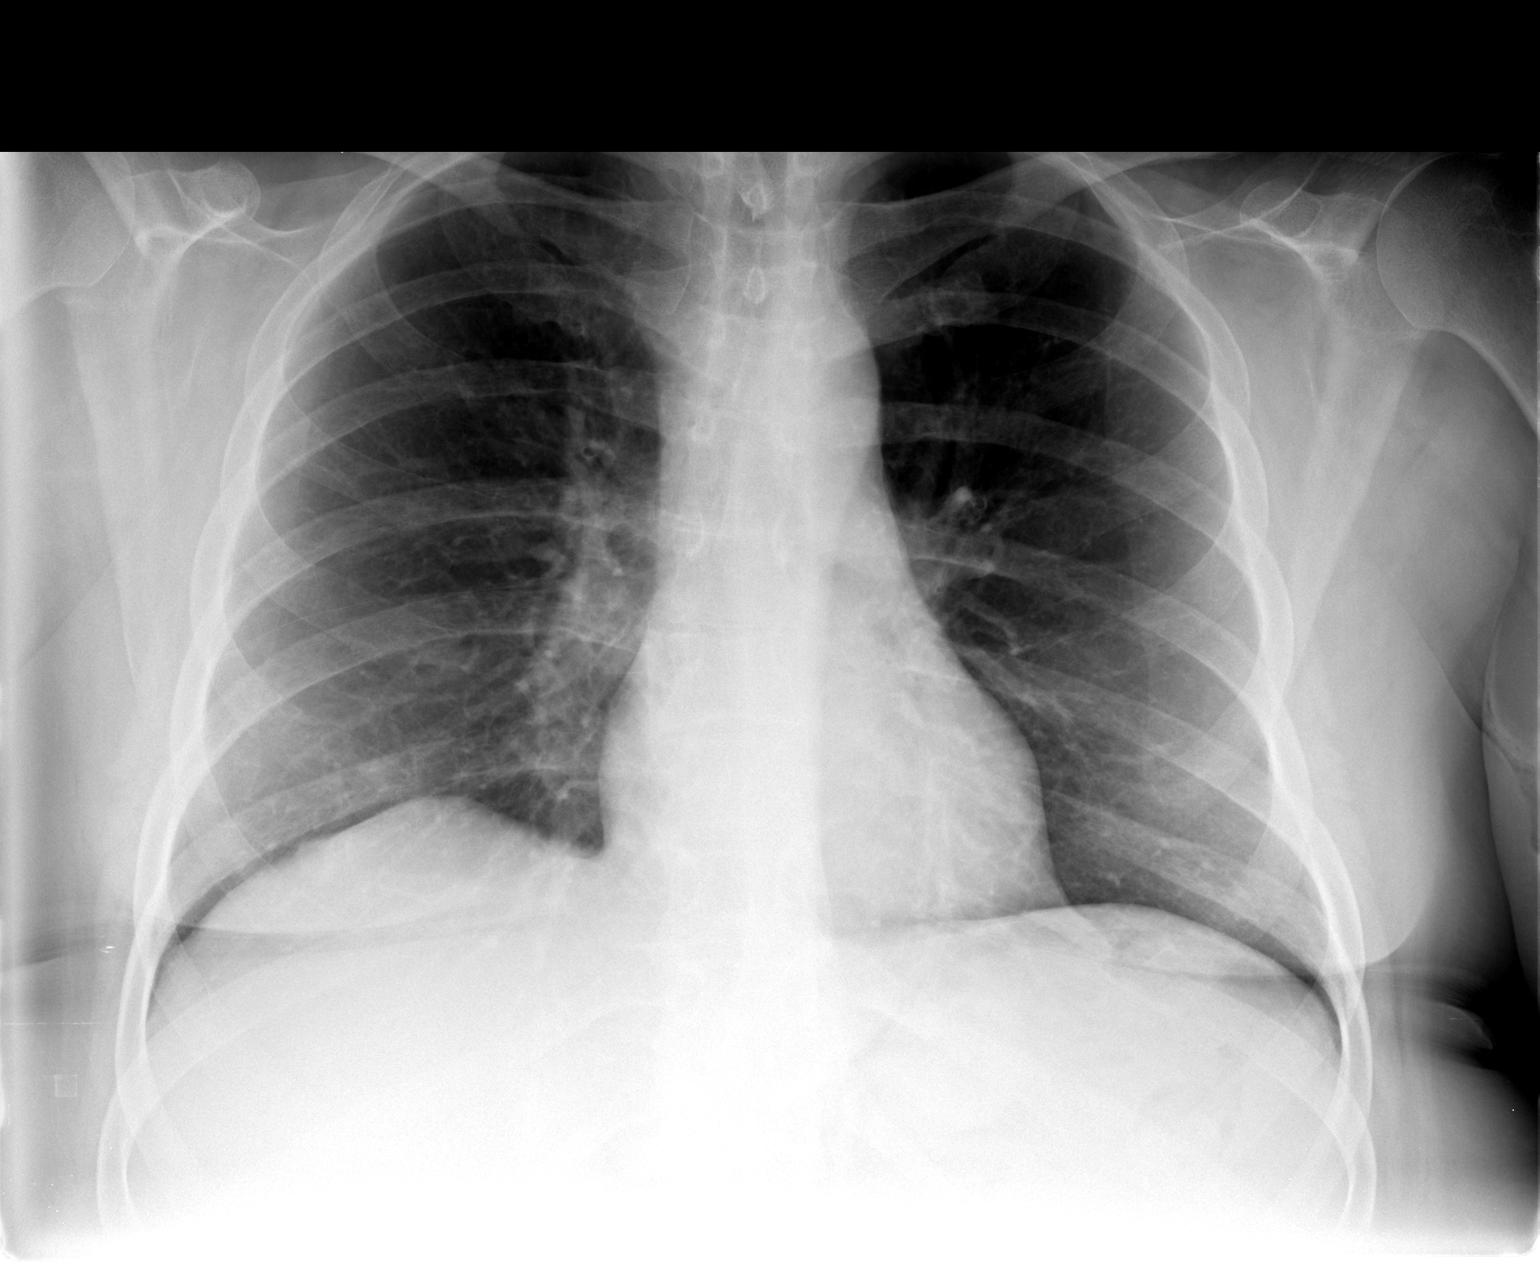

[view not recorded (2 of 2)]
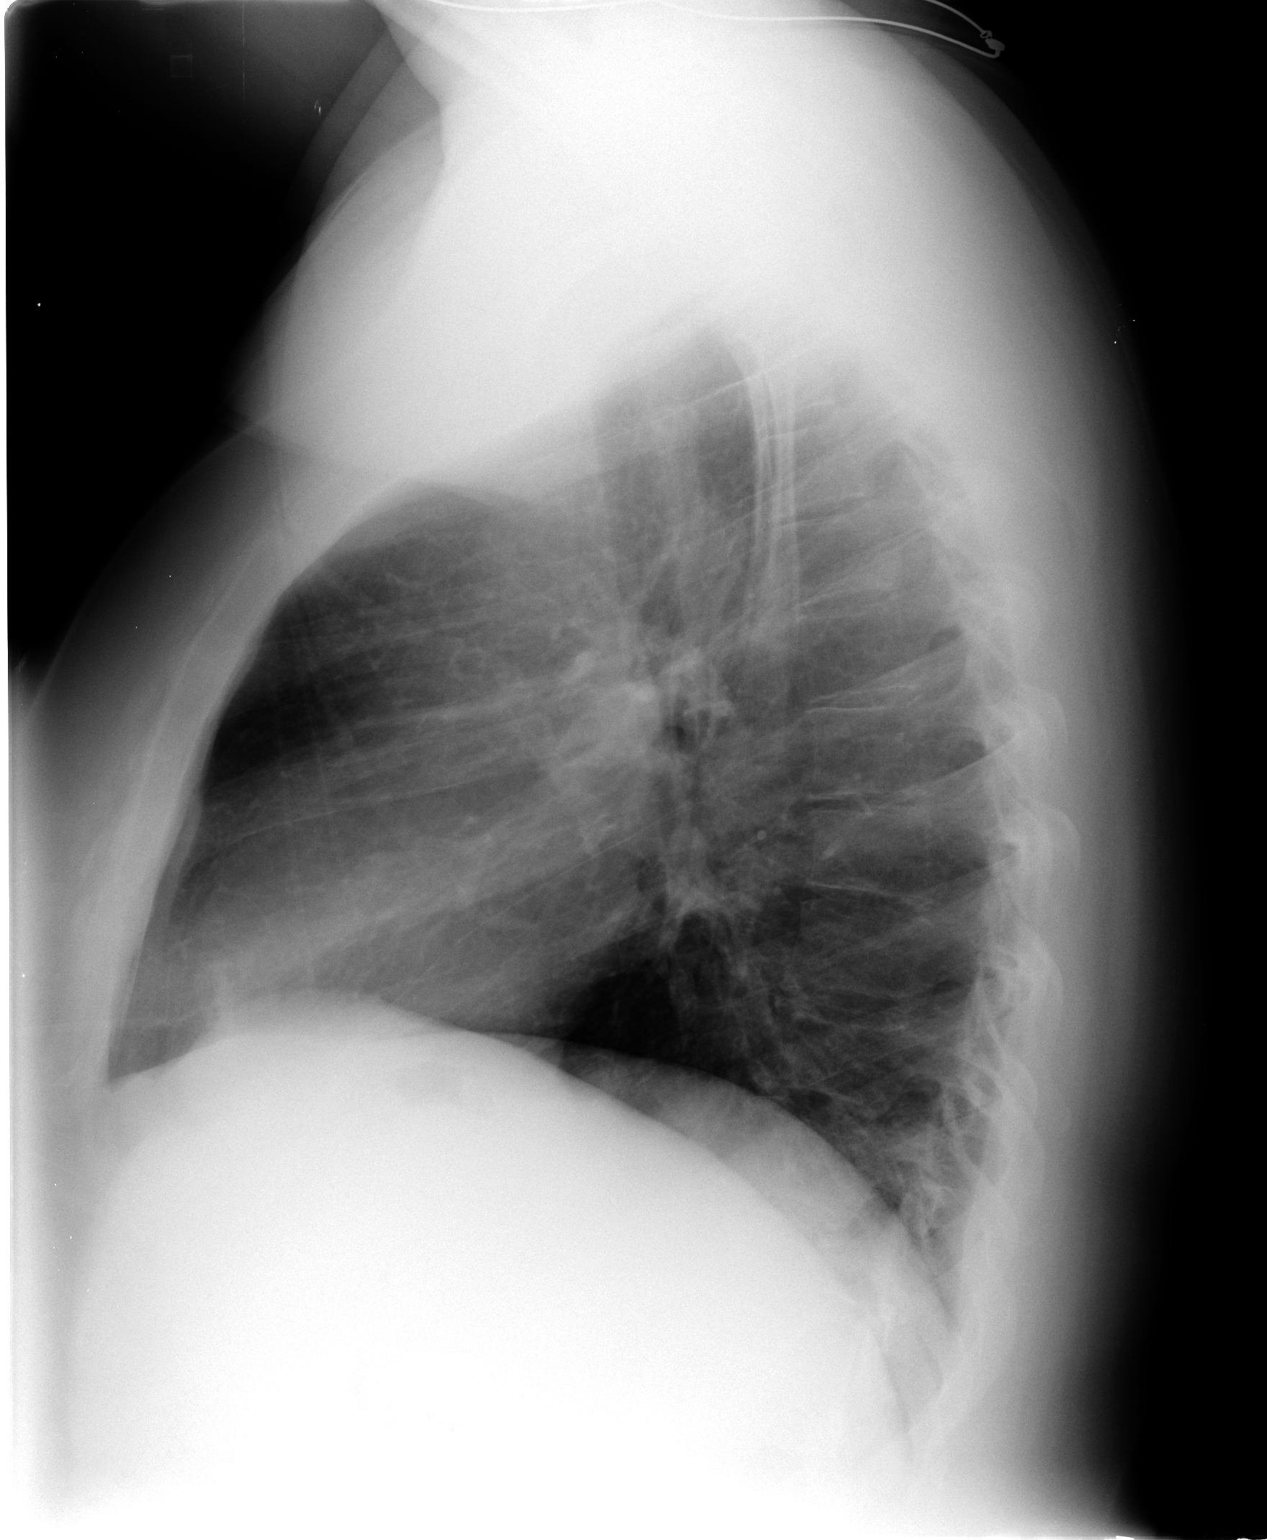

[2 of 2 positions shown; findings below may reference images not displayed]

FINDINGS: The lungs are clear.  Heart size is normal.  There is no
pleural effusion or focal bony abnormality.
IMPRESSION: No acute disease.

## 2010-03-08 IMAGING — CR DG CHEST 2V
2 series · 2 of 2 positions shown · non-contrast
Comparison: 04/27/2008

CLINICAL DATA: Cough

CHEST - 1 VIEW

[w chest pa]
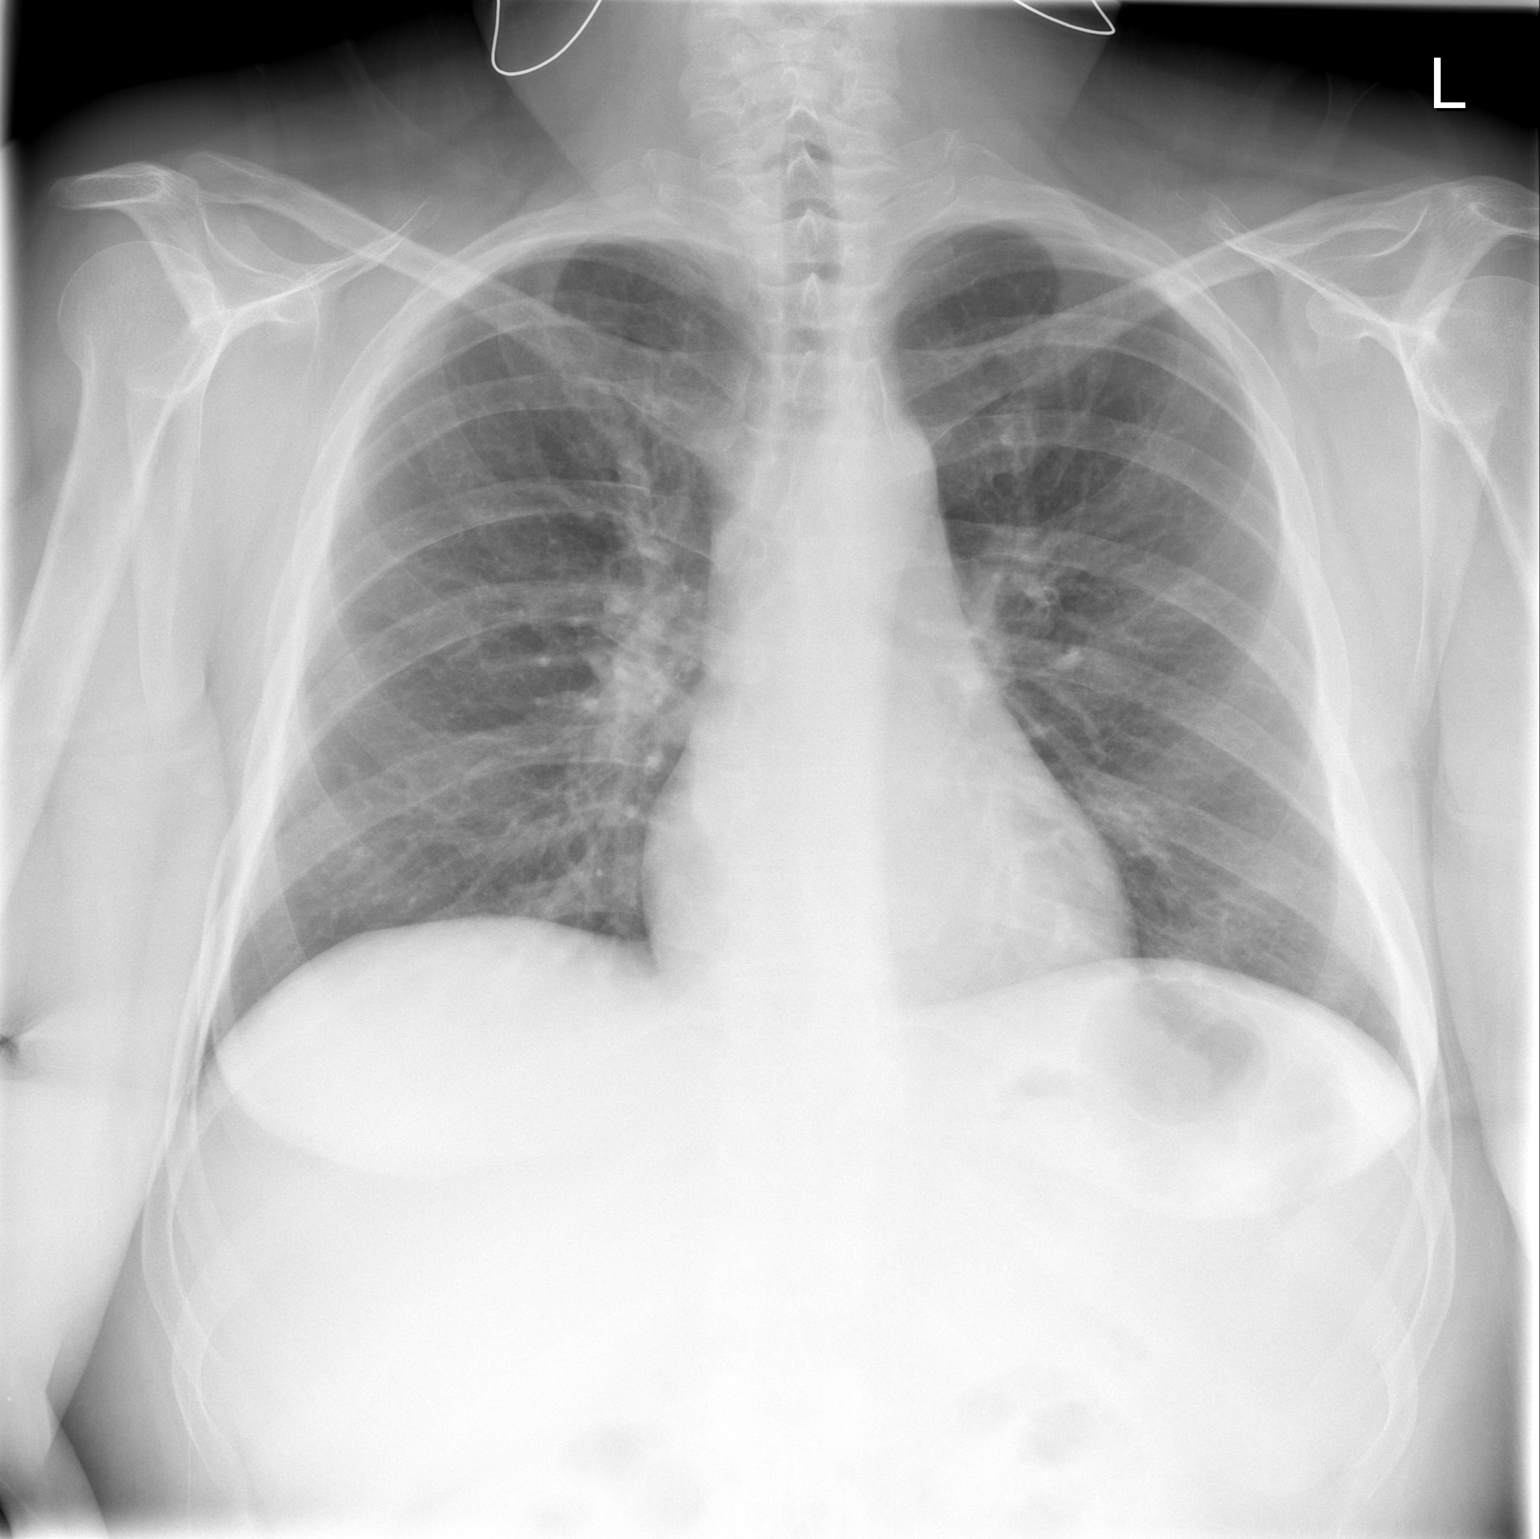

[w chest lat]
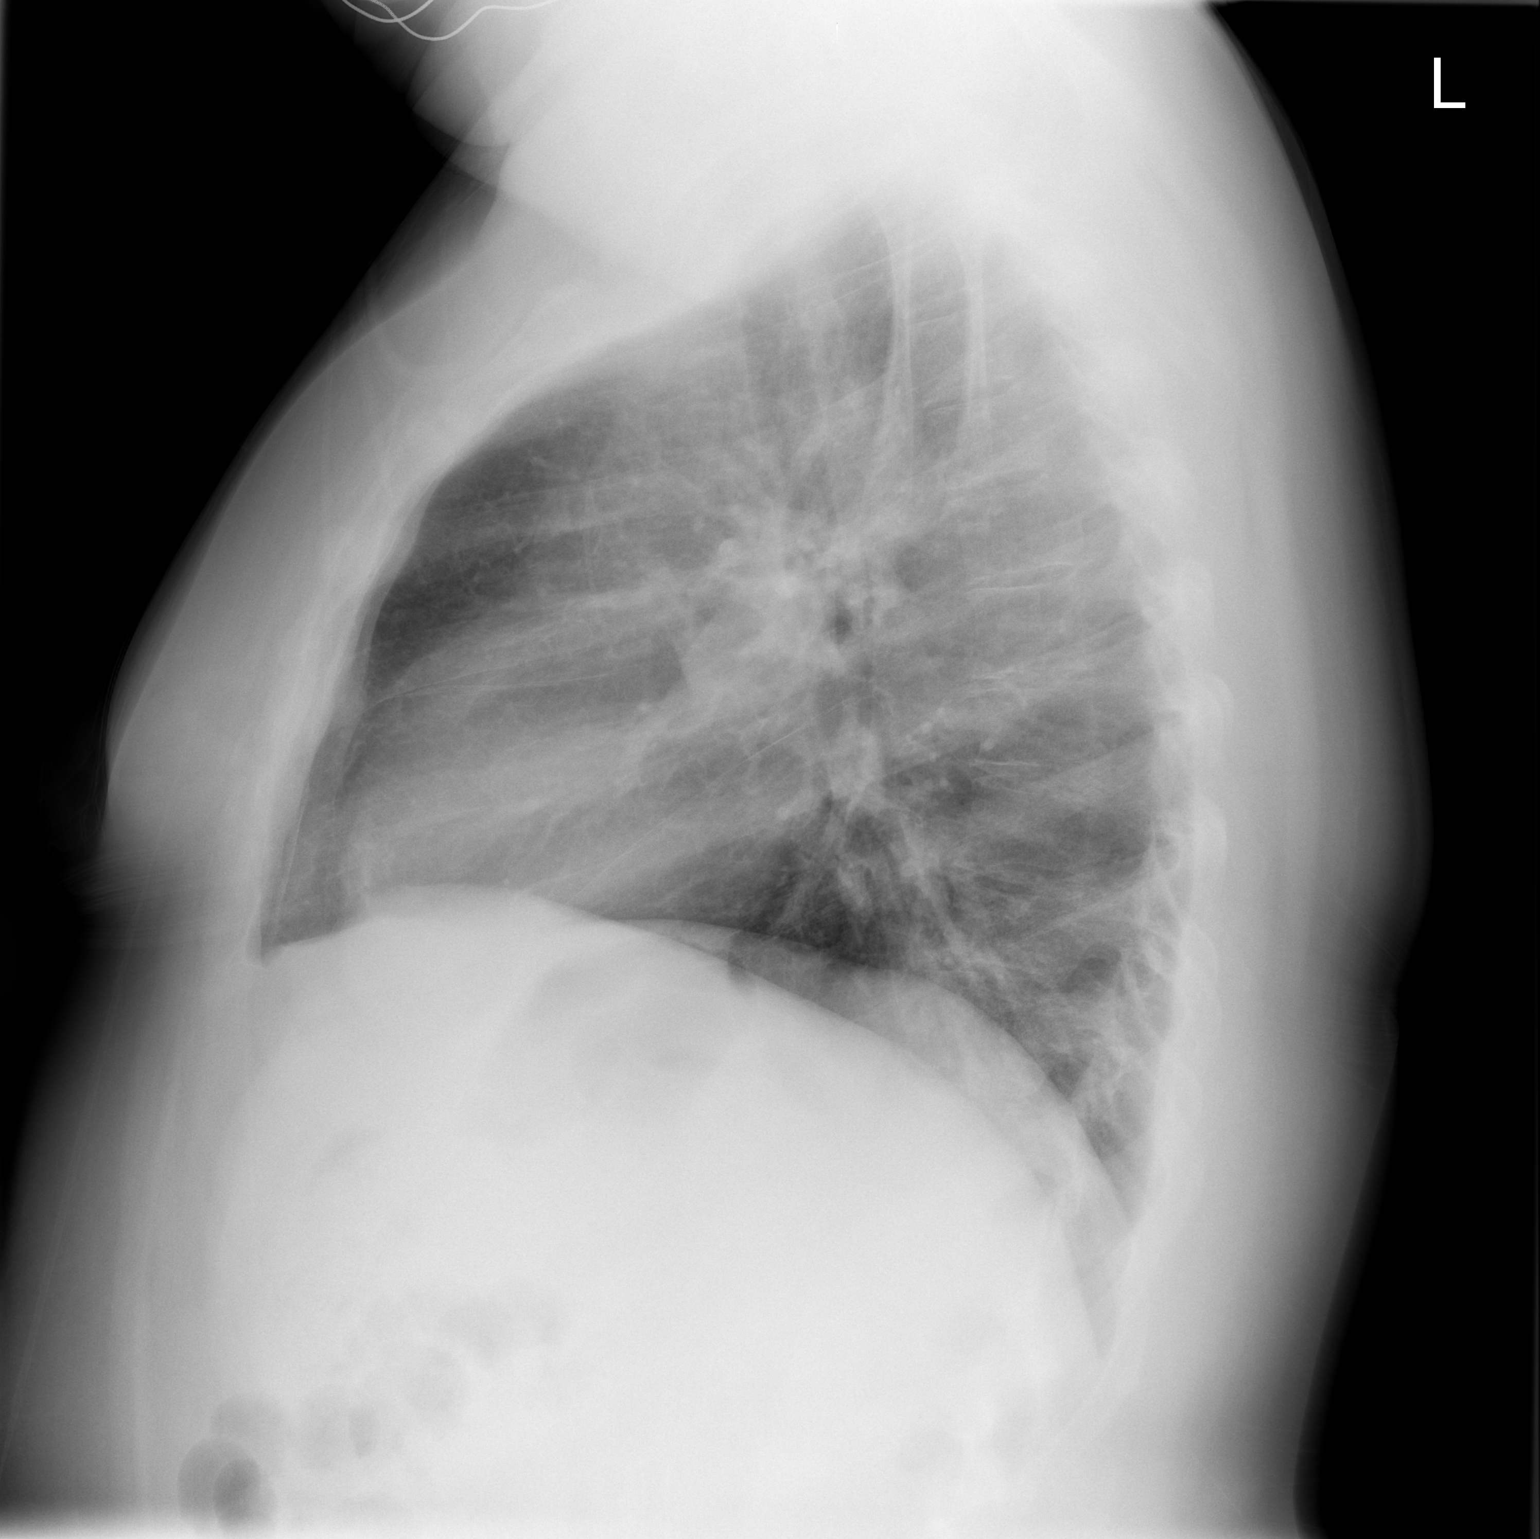

[2 of 2 positions shown; findings below may reference images not displayed]

FINDINGS: The heart size and mediastinal contours are within normal
limits.  Both lungs are clear.  Lung volumes at lower limits of
normal.
IMPRESSION: No active disease.

## 2010-07-15 ENCOUNTER — Emergency Department (HOSPITAL_COMMUNITY): Admission: EM | Admit: 2010-07-15 | Discharge: 2010-07-15 | Payer: Self-pay | Admitting: Emergency Medicine

## 2010-09-28 ENCOUNTER — Encounter: Payer: Self-pay | Admitting: Nephrology

## 2010-09-29 ENCOUNTER — Encounter: Payer: Self-pay | Admitting: Nephrology

## 2010-11-23 LAB — BASIC METABOLIC PANEL
BUN: 14 mg/dL (ref 6–23)
BUN: 18 mg/dL (ref 6–23)
BUN: 19 mg/dL (ref 6–23)
BUN: 27 mg/dL — ABNORMAL HIGH (ref 6–23)
BUN: 30 mg/dL — ABNORMAL HIGH (ref 6–23)
BUN: 30 mg/dL — ABNORMAL HIGH (ref 6–23)
BUN: 40 mg/dL — ABNORMAL HIGH (ref 6–23)
BUN: 48 mg/dL — ABNORMAL HIGH (ref 6–23)
BUN: 60 mg/dL — ABNORMAL HIGH (ref 6–23)
CO2: 24 mEq/L (ref 19–32)
CO2: 25 mEq/L (ref 19–32)
CO2: 27 mEq/L (ref 19–32)
CO2: 27 mEq/L (ref 19–32)
CO2: 28 mEq/L (ref 19–32)
CO2: 28 mEq/L (ref 19–32)
CO2: 29 mEq/L (ref 19–32)
Calcium: 10 mg/dL (ref 8.4–10.5)
Calcium: 8.8 mg/dL (ref 8.4–10.5)
Calcium: 8.9 mg/dL (ref 8.4–10.5)
Calcium: 9 mg/dL (ref 8.4–10.5)
Calcium: 9.3 mg/dL (ref 8.4–10.5)
Calcium: 9.6 mg/dL (ref 8.4–10.5)
Calcium: 9.9 mg/dL (ref 8.4–10.5)
Chloride: 77 mEq/L — CL (ref 96–112)
Chloride: 88 mEq/L — ABNORMAL LOW (ref 96–112)
Chloride: 93 mEq/L — ABNORMAL LOW (ref 96–112)
Chloride: 94 mEq/L — ABNORMAL LOW (ref 96–112)
Chloride: 95 mEq/L — ABNORMAL LOW (ref 96–112)
Chloride: 96 mEq/L (ref 96–112)
Chloride: 97 mEq/L (ref 96–112)
Chloride: 97 mEq/L (ref 96–112)
Creatinine, Ser: 3.01 mg/dL — ABNORMAL HIGH (ref 0.4–1.2)
Creatinine, Ser: 3.77 mg/dL — ABNORMAL HIGH (ref 0.4–1.2)
Creatinine, Ser: 6.36 mg/dL — ABNORMAL HIGH (ref 0.4–1.2)
Creatinine, Ser: 6.78 mg/dL — ABNORMAL HIGH (ref 0.4–1.2)
Creatinine, Ser: 7.31 mg/dL — ABNORMAL HIGH (ref 0.4–1.2)
Creatinine, Ser: 7.71 mg/dL — ABNORMAL HIGH (ref 0.4–1.2)
GFR calc Af Amer: 7 mL/min — ABNORMAL LOW (ref >60)
GFR calc Af Amer: 8 mL/min — ABNORMAL LOW (ref >60)
GFR calc Af Amer: 9 mL/min — ABNORMAL LOW (ref >60)
GFR calc non Af Amer: 12 mL/min — ABNORMAL LOW (ref >60)
GFR calc non Af Amer: 17 mL/min — ABNORMAL LOW (ref >60)
GFR calc non Af Amer: 6 mL/min — ABNORMAL LOW (ref >60)
GFR calc non Af Amer: 7 mL/min — ABNORMAL LOW (ref >60)
GFR calc non Af Amer: 7 mL/min — ABNORMAL LOW (ref >60)
GFR calc non Af Amer: 7 mL/min — ABNORMAL LOW (ref >60)
GFR calc non Af Amer: 8 mL/min — ABNORMAL LOW (ref >60)
GFR calc non Af Amer: 8 mL/min — ABNORMAL LOW (ref >60)
Glucose, Bld: 167 mg/dL — ABNORMAL HIGH (ref 70–99)
Glucose, Bld: 233 mg/dL — ABNORMAL HIGH (ref 70–99)
Glucose, Bld: 260 mg/dL — ABNORMAL HIGH (ref 70–99)
Glucose, Bld: 268 mg/dL — ABNORMAL HIGH (ref 70–99)
Glucose, Bld: 342 mg/dL — ABNORMAL HIGH (ref 70–99)
Glucose, Bld: 365 mg/dL — ABNORMAL HIGH (ref 70–99)
Glucose, Bld: 421 mg/dL — ABNORMAL HIGH (ref 70–99)
Glucose, Bld: 443 mg/dL — ABNORMAL HIGH (ref 70–99)
Glucose, Bld: 997 mg/dL (ref 70–99)
Potassium: 3.9 mEq/L (ref 3.5–5.1)
Potassium: 4 mEq/L (ref 3.5–5.1)
Potassium: 4 mEq/L (ref 3.5–5.1)
Potassium: 4.2 mEq/L (ref 3.5–5.1)
Potassium: 5.3 mEq/L — ABNORMAL HIGH (ref 3.5–5.1)
Sodium: 118 mEq/L — CL (ref 135–145)
Sodium: 124 mEq/L — ABNORMAL LOW (ref 135–145)
Sodium: 128 mEq/L — ABNORMAL LOW (ref 135–145)
Sodium: 129 mEq/L — ABNORMAL LOW (ref 135–145)
Sodium: 129 mEq/L — ABNORMAL LOW (ref 135–145)
Sodium: 133 mEq/L — ABNORMAL LOW (ref 135–145)
Sodium: 134 mEq/L — ABNORMAL LOW (ref 135–145)

## 2010-11-23 LAB — COMPREHENSIVE METABOLIC PANEL
ALT: 15 U/L (ref 0–35)
AST: 18 U/L (ref 0–37)
Albumin: 2.9 g/dL — ABNORMAL LOW (ref 3.5–5.2)
Alkaline Phosphatase: 103 U/L (ref 39–117)
Chloride: 89 mEq/L — ABNORMAL LOW (ref 96–112)
GFR calc Af Amer: 8 mL/min — ABNORMAL LOW (ref >60)
Potassium: 3.4 mEq/L — ABNORMAL LOW (ref 3.5–5.1)
Total Bilirubin: 0.9 mg/dL (ref 0.3–1.2)

## 2010-11-23 LAB — POCT I-STAT 3, ART BLOOD GAS (G3+)
Acid-base deficit: 5 mmol/L — ABNORMAL HIGH (ref 0.0–2.0)
O2 Saturation: 66 %
Patient temperature: 100
TCO2: 22 mmol/L (ref 0–100)
pCO2 arterial: 40.3 mmHg (ref 35.0–45.0)

## 2010-11-23 LAB — CBC
HCT: 32.4 % — ABNORMAL LOW (ref 36.0–46.0)
HCT: 35.8 % — ABNORMAL LOW (ref 36.0–46.0)
Hemoglobin: 12 g/dL (ref 12.0–15.0)
MCV: 93.3 fL (ref 78.0–100.0)
MCV: 93.6 fL (ref 78.0–100.0)
MCV: 94.3 fL (ref 78.0–100.0)
Platelets: 223 10*3/uL (ref 150–400)
Platelets: 234 10*3/uL (ref 150–400)
Platelets: 290 10*3/uL (ref 150–400)
RDW: 14.3 % (ref 11.5–15.5)
WBC: 15.3 10*3/uL — ABNORMAL HIGH (ref 4.0–10.5)
WBC: 6.4 10*3/uL (ref 4.0–10.5)
WBC: 7.9 10*3/uL (ref 4.0–10.5)

## 2010-11-23 LAB — DIFFERENTIAL
Eosinophils Absolute: 0 10*3/uL (ref 0.0–0.7)
Eosinophils Relative: 0 % (ref 0–5)
Lymphocytes Relative: 4 % — ABNORMAL LOW (ref 12–46)
Lymphs Abs: 0.3 10*3/uL — ABNORMAL LOW (ref 0.7–4.0)
Monocytes Absolute: 0.2 10*3/uL (ref 0.1–1.0)

## 2010-11-23 LAB — GLUCOSE, CAPILLARY
Glucose-Capillary: 108 mg/dL — ABNORMAL HIGH (ref 70–99)
Glucose-Capillary: 132 mg/dL — ABNORMAL HIGH (ref 70–99)
Glucose-Capillary: 165 mg/dL — ABNORMAL HIGH (ref 70–99)
Glucose-Capillary: 230 mg/dL — ABNORMAL HIGH (ref 70–99)
Glucose-Capillary: 231 mg/dL — ABNORMAL HIGH (ref 70–99)
Glucose-Capillary: 245 mg/dL — ABNORMAL HIGH (ref 70–99)
Glucose-Capillary: 324 mg/dL — ABNORMAL HIGH (ref 70–99)
Glucose-Capillary: 336 mg/dL — ABNORMAL HIGH (ref 70–99)
Glucose-Capillary: 361 mg/dL — ABNORMAL HIGH (ref 70–99)
Glucose-Capillary: 416 mg/dL — ABNORMAL HIGH (ref 70–99)
Glucose-Capillary: 570 mg/dL (ref 70–99)
Glucose-Capillary: >600 mg/dL (ref 70–99)

## 2010-11-23 LAB — RENAL FUNCTION PANEL
Albumin: 2.7 g/dL — ABNORMAL LOW (ref 3.5–5.2)
Chloride: 98 mEq/L (ref 96–112)
Creatinine, Ser: 7.71 mg/dL — ABNORMAL HIGH (ref 0.4–1.2)
GFR calc Af Amer: 7 mL/min — ABNORMAL LOW (ref >60)
GFR calc non Af Amer: 6 mL/min — ABNORMAL LOW (ref >60)
Potassium: 4.3 mEq/L (ref 3.5–5.1)

## 2010-11-23 LAB — URINALYSIS, ROUTINE W REFLEX MICROSCOPIC
Bilirubin Urine: NEGATIVE
Glucose, UA: >1000 mg/dL — AB
Ketones, ur: 40 mg/dL — AB
Protein, ur: >300 mg/dL — AB
pH: 7 (ref 5.0–8.0)

## 2010-11-23 LAB — HEPATITIS B SURFACE ANTIGEN: Hepatitis B Surface Ag: NEGATIVE

## 2010-11-23 LAB — URINE MICROSCOPIC-ADD ON

## 2010-11-23 LAB — URINE CULTURE: Colony Count: NO GROWTH

## 2010-12-01 LAB — BASIC METABOLIC PANEL
BUN: 36 mg/dL — ABNORMAL HIGH (ref 6–23)
BUN: 76 mg/dL — ABNORMAL HIGH (ref 6–23)
CO2: 18 mEq/L — ABNORMAL LOW (ref 19–32)
Calcium: 8 mg/dL — ABNORMAL LOW (ref 8.4–10.5)
Calcium: 8.1 mg/dL — ABNORMAL LOW (ref 8.4–10.5)
Calcium: 8.4 mg/dL (ref 8.4–10.5)
GFR calc Af Amer: 4 mL/min — ABNORMAL LOW (ref >60)
GFR calc non Af Amer: 4 mL/min — ABNORMAL LOW (ref >60)
GFR calc non Af Amer: 4 mL/min — ABNORMAL LOW (ref >60)
GFR calc non Af Amer: 5 mL/min — ABNORMAL LOW (ref >60)
GFR calc non Af Amer: 6 mL/min — ABNORMAL LOW (ref >60)
Glucose, Bld: 177 mg/dL — ABNORMAL HIGH (ref 70–99)
Glucose, Bld: 221 mg/dL — ABNORMAL HIGH (ref 70–99)
Glucose, Bld: 231 mg/dL — ABNORMAL HIGH (ref 70–99)
Glucose, Bld: 375 mg/dL — ABNORMAL HIGH (ref 70–99)
Potassium: 4.1 mEq/L (ref 3.5–5.1)
Potassium: 4.9 mEq/L (ref 3.5–5.1)
Potassium: 5 mEq/L (ref 3.5–5.1)
Potassium: 5.1 mEq/L (ref 3.5–5.1)
Sodium: 126 mEq/L — ABNORMAL LOW (ref 135–145)
Sodium: 130 mEq/L — ABNORMAL LOW (ref 135–145)
Sodium: 137 mEq/L (ref 135–145)

## 2010-12-01 LAB — GLUCOSE, CAPILLARY
Glucose-Capillary: 109 mg/dL — ABNORMAL HIGH (ref 70–99)
Glucose-Capillary: 112 mg/dL — ABNORMAL HIGH (ref 70–99)
Glucose-Capillary: 131 mg/dL — ABNORMAL HIGH (ref 70–99)
Glucose-Capillary: 146 mg/dL — ABNORMAL HIGH (ref 70–99)
Glucose-Capillary: 149 mg/dL — ABNORMAL HIGH (ref 70–99)
Glucose-Capillary: 180 mg/dL — ABNORMAL HIGH (ref 70–99)
Glucose-Capillary: 182 mg/dL — ABNORMAL HIGH (ref 70–99)
Glucose-Capillary: 246 mg/dL — ABNORMAL HIGH (ref 70–99)
Glucose-Capillary: 516 mg/dL — ABNORMAL HIGH (ref 70–99)
Glucose-Capillary: 68 mg/dL — ABNORMAL LOW (ref 70–99)
Glucose-Capillary: >600 mg/dL (ref 70–99)

## 2010-12-01 LAB — CARDIAC PANEL(CRET KIN+CKTOT+MB+TROPI)
CK, MB: 2.8 ng/mL (ref 0.3–4.0)
Total CK: 317 U/L — ABNORMAL HIGH (ref 7–177)
Troponin I: 0.04 ng/mL (ref 0.00–0.06)

## 2010-12-01 LAB — KETONES, QUALITATIVE: Acetone, Bld: NEGATIVE

## 2010-12-01 LAB — CBC
HCT: 22.4 % — ABNORMAL LOW (ref 36.0–46.0)
HCT: 23 % — ABNORMAL LOW (ref 36.0–46.0)
HCT: 25.4 % — ABNORMAL LOW (ref 36.0–46.0)
HCT: 25.9 % — ABNORMAL LOW (ref 36.0–46.0)
Hemoglobin: 7.5 g/dL — ABNORMAL LOW (ref 12.0–15.0)
Hemoglobin: 7.7 g/dL — ABNORMAL LOW (ref 12.0–15.0)
Hemoglobin: 8.4 g/dL — ABNORMAL LOW (ref 12.0–15.0)
MCHC: 32.3 g/dL (ref 30.0–36.0)
MCHC: 33.2 g/dL (ref 30.0–36.0)
Platelets: 186 10*3/uL (ref 150–400)
Platelets: 196 10*3/uL (ref 150–400)
RBC: 2.45 MIL/uL — ABNORMAL LOW (ref 3.87–5.11)
RBC: 2.69 MIL/uL — ABNORMAL LOW (ref 3.87–5.11)
RBC: 2.93 MIL/uL — ABNORMAL LOW (ref 3.87–5.11)
RDW: 15.1 % (ref 11.5–15.5)
RDW: 15.4 % (ref 11.5–15.5)
RDW: 16.2 % — ABNORMAL HIGH (ref 11.5–15.5)
WBC: 4.3 10*3/uL (ref 4.0–10.5)
WBC: 6 10*3/uL (ref 4.0–10.5)
WBC: 6.2 10*3/uL (ref 4.0–10.5)
WBC: 8.2 10*3/uL (ref 4.0–10.5)

## 2010-12-01 LAB — POCT I-STAT 3, VENOUS BLOOD GAS (G3P V)
Acid-base deficit: 4 mmol/L — ABNORMAL HIGH (ref 0.0–2.0)
Bicarbonate: 21 mEq/L (ref 20.0–24.0)
O2 Saturation: 80 %
pCO2, Ven: 37.8 mmHg — ABNORMAL LOW (ref 45.0–50.0)
pO2, Ven: 46 mmHg — ABNORMAL HIGH (ref 30.0–45.0)

## 2010-12-01 LAB — COMPREHENSIVE METABOLIC PANEL
ALT: 25 U/L (ref 0–35)
AST: 21 U/L (ref 0–37)
CO2: 19 mEq/L (ref 19–32)
Calcium: 8.7 mg/dL (ref 8.4–10.5)
GFR calc Af Amer: 6 mL/min — ABNORMAL LOW (ref >60)
GFR calc non Af Amer: 5 mL/min — ABNORMAL LOW (ref >60)
Sodium: 126 mEq/L — ABNORMAL LOW (ref 135–145)

## 2010-12-01 LAB — DIFFERENTIAL
Eosinophils Relative: 3 % (ref 0–5)
Lymphocytes Relative: 17 % (ref 12–46)
Lymphs Abs: 1 10*3/uL (ref 0.7–4.0)
Monocytes Absolute: 0.5 10*3/uL (ref 0.1–1.0)
Monocytes Relative: 9 % (ref 3–12)

## 2010-12-01 LAB — CK TOTAL AND CKMB (NOT AT ARMC)
CK, MB: 2.5 ng/mL (ref 0.3–4.0)
Total CK: 193 U/L — ABNORMAL HIGH (ref 7–177)

## 2010-12-01 LAB — POCT I-STAT, CHEM 8
BUN: 73 mg/dL — ABNORMAL HIGH (ref 6–23)
BUN: 79 mg/dL — ABNORMAL HIGH (ref 6–23)
Calcium, Ion: 0.91 mmol/L — ABNORMAL LOW (ref 1.12–1.32)
Calcium, Ion: 0.94 mmol/L — ABNORMAL LOW (ref 1.12–1.32)
Chloride: 101 mEq/L (ref 96–112)
Chloride: 97 mEq/L (ref 96–112)
Creatinine, Ser: 9.5 mg/dL — ABNORMAL HIGH (ref 0.4–1.2)
Glucose, Bld: >700 mg/dL (ref 70–99)
Glucose, Bld: >700 mg/dL (ref 70–99)
Potassium: 4.9 mEq/L (ref 3.5–5.1)
Potassium: 6.3 mEq/L (ref 3.5–5.1)

## 2010-12-01 LAB — MAGNESIUM: Magnesium: 2.2 mg/dL (ref 1.5–2.5)

## 2010-12-01 LAB — SAMPLE TO BLOOD BANK

## 2010-12-01 LAB — HEPATITIS B SURFACE ANTIGEN: Hepatitis B Surface Ag: NEGATIVE

## 2010-12-01 LAB — PHOSPHORUS: Phosphorus: 9.6 mg/dL (ref 2.3–4.6)

## 2010-12-01 LAB — TSH: TSH: 20.826 u[IU]/mL — ABNORMAL HIGH (ref 0.350–4.500)

## 2010-12-01 LAB — ALBUMIN: Albumin: 2.6 g/dL — ABNORMAL LOW (ref 3.5–5.2)

## 2010-12-01 LAB — MRSA PCR SCREENING: MRSA by PCR: POSITIVE — AB

## 2010-12-09 LAB — POCT I-STAT 3, VENOUS BLOOD GAS (G3P V)
Acid-Base Excess: 4 mmol/L — ABNORMAL HIGH (ref 0.0–2.0)
Bicarbonate: 25.4 mEq/L — ABNORMAL HIGH (ref 20.0–24.0)
O2 Saturation: 99 %
TCO2: 26 mmol/L (ref 0–100)
pH, Ven: 7.552 — ABNORMAL HIGH (ref 7.250–7.300)

## 2010-12-09 LAB — CBC
MCHC: 33.6 g/dL (ref 30.0–36.0)
MCV: 93.6 fL (ref 78.0–100.0)
Platelets: 227 10*3/uL (ref 150–400)

## 2010-12-09 LAB — DIFFERENTIAL
Eosinophils Relative: 2 % (ref 0–5)
Lymphocytes Relative: 9 % — ABNORMAL LOW (ref 12–46)
Lymphs Abs: 0.8 10*3/uL (ref 0.7–4.0)
Monocytes Absolute: 0.5 10*3/uL (ref 0.1–1.0)
Monocytes Relative: 6 % (ref 3–12)

## 2010-12-09 LAB — COMPREHENSIVE METABOLIC PANEL
ALT: 25 U/L (ref 0–35)
AST: 17 U/L (ref 0–37)
Albumin: 3.3 g/dL — ABNORMAL LOW (ref 3.5–5.2)
Calcium: 9.8 mg/dL (ref 8.4–10.5)
Creatinine, Ser: 6.01 mg/dL — ABNORMAL HIGH (ref 0.4–1.2)
GFR calc Af Amer: 9 mL/min — ABNORMAL LOW (ref >60)
Sodium: 124 mEq/L — ABNORMAL LOW (ref 135–145)
Total Protein: 6.6 g/dL (ref 6.0–8.3)

## 2010-12-09 LAB — GLUCOSE, CAPILLARY: Glucose-Capillary: 419 mg/dL — ABNORMAL HIGH (ref 70–99)

## 2010-12-12 LAB — BASIC METABOLIC PANEL
BUN: 19 mg/dL (ref 6–23)
BUN: 20 mg/dL (ref 6–23)
BUN: 21 mg/dL (ref 6–23)
BUN: 24 mg/dL — ABNORMAL HIGH (ref 6–23)
BUN: 25 mg/dL — ABNORMAL HIGH (ref 6–23)
BUN: 30 mg/dL — ABNORMAL HIGH (ref 6–23)
BUN: 32 mg/dL — ABNORMAL HIGH (ref 6–23)
BUN: 8 mg/dL (ref 6–23)
CO2: 20 mEq/L (ref 19–32)
CO2: 24 mEq/L (ref 19–32)
CO2: 25 mEq/L (ref 19–32)
Calcium: 8.8 mg/dL (ref 8.4–10.5)
Calcium: 8.8 mg/dL (ref 8.4–10.5)
Calcium: 9 mg/dL (ref 8.4–10.5)
Calcium: 9.1 mg/dL (ref 8.4–10.5)
Calcium: 9.1 mg/dL (ref 8.4–10.5)
Chloride: 97 mEq/L (ref 96–112)
Chloride: 97 mEq/L (ref 96–112)
Chloride: 98 mEq/L (ref 96–112)
Chloride: 98 mEq/L (ref 96–112)
Creatinine, Ser: 3.21 mg/dL — ABNORMAL HIGH (ref 0.4–1.2)
Creatinine, Ser: 4.71 mg/dL — ABNORMAL HIGH (ref 0.4–1.2)
Creatinine, Ser: 5.28 mg/dL — ABNORMAL HIGH (ref 0.4–1.2)
Creatinine, Ser: 5.3 mg/dL — ABNORMAL HIGH (ref 0.4–1.2)
Creatinine, Ser: 5.42 mg/dL — ABNORMAL HIGH (ref 0.4–1.2)
Creatinine, Ser: 6.03 mg/dL — ABNORMAL HIGH (ref 0.4–1.2)
Creatinine, Ser: 8.91 mg/dL — ABNORMAL HIGH (ref 0.4–1.2)
GFR calc Af Amer: 10 mL/min — ABNORMAL LOW (ref >60)
GFR calc Af Amer: 11 mL/min — ABNORMAL LOW (ref >60)
GFR calc Af Amer: 11 mL/min — ABNORMAL LOW (ref >60)
GFR calc Af Amer: 12 mL/min — ABNORMAL LOW (ref >60)
GFR calc Af Amer: 19 mL/min — ABNORMAL LOW (ref >60)
GFR calc non Af Amer: 11 mL/min — ABNORMAL LOW (ref >60)
GFR calc non Af Amer: 14 mL/min — ABNORMAL LOW (ref >60)
GFR calc non Af Amer: 5 mL/min — ABNORMAL LOW (ref >60)
GFR calc non Af Amer: 5 mL/min — ABNORMAL LOW (ref >60)
GFR calc non Af Amer: 8 mL/min — ABNORMAL LOW (ref >60)
GFR calc non Af Amer: 9 mL/min — ABNORMAL LOW (ref >60)
Glucose, Bld: 171 mg/dL — ABNORMAL HIGH (ref 70–99)
Glucose, Bld: 236 mg/dL — ABNORMAL HIGH (ref 70–99)
Glucose, Bld: 943 mg/dL (ref 70–99)
Glucose, Bld: 989 mg/dL (ref 70–99)
Potassium: 3.7 mEq/L (ref 3.5–5.1)
Potassium: 4 mEq/L (ref 3.5–5.1)
Potassium: 4 mEq/L (ref 3.5–5.1)
Potassium: 4.3 mEq/L (ref 3.5–5.1)
Potassium: 6.8 mEq/L (ref 3.5–5.1)
Sodium: 118 mEq/L — CL (ref 135–145)
Sodium: 118 mEq/L — CL (ref 135–145)
Sodium: 131 mEq/L — ABNORMAL LOW (ref 135–145)
Sodium: 135 mEq/L (ref 135–145)

## 2010-12-12 LAB — CBC
HCT: 38.7 % (ref 36.0–46.0)
HCT: 42.3 % (ref 36.0–46.0)
MCHC: 33.3 g/dL (ref 30.0–36.0)
MCV: 92.3 fL (ref 78.0–100.0)
Platelets: 195 10*3/uL (ref 150–400)
Platelets: 202 10*3/uL (ref 150–400)
Platelets: 265 10*3/uL (ref 150–400)
RDW: 15.9 % — ABNORMAL HIGH (ref 11.5–15.5)
RDW: 17 % — ABNORMAL HIGH (ref 11.5–15.5)
WBC: 5 10*3/uL (ref 4.0–10.5)
WBC: 6.2 10*3/uL (ref 4.0–10.5)

## 2010-12-12 LAB — GLUCOSE, CAPILLARY
Glucose-Capillary: 159 mg/dL — ABNORMAL HIGH (ref 70–99)
Glucose-Capillary: 161 mg/dL — ABNORMAL HIGH (ref 70–99)
Glucose-Capillary: 192 mg/dL — ABNORMAL HIGH (ref 70–99)
Glucose-Capillary: 202 mg/dL — ABNORMAL HIGH (ref 70–99)
Glucose-Capillary: 214 mg/dL — ABNORMAL HIGH (ref 70–99)
Glucose-Capillary: 274 mg/dL — ABNORMAL HIGH (ref 70–99)
Glucose-Capillary: 291 mg/dL — ABNORMAL HIGH (ref 70–99)
Glucose-Capillary: 313 mg/dL — ABNORMAL HIGH (ref 70–99)
Glucose-Capillary: 393 mg/dL — ABNORMAL HIGH (ref 70–99)
Glucose-Capillary: 403 mg/dL — ABNORMAL HIGH (ref 70–99)
Glucose-Capillary: 482 mg/dL — ABNORMAL HIGH (ref 70–99)
Glucose-Capillary: 487 mg/dL — ABNORMAL HIGH (ref 70–99)
Glucose-Capillary: >600 mg/dL (ref 70–99)
Glucose-Capillary: >600 mg/dL (ref 70–99)

## 2010-12-12 LAB — SALICYLATE LEVEL: Salicylate Lvl: 4.1 mg/dL (ref 2.8–20.0)

## 2010-12-12 LAB — LIPID PANEL
HDL: 65 mg/dL (ref >39)
LDL Cholesterol: 80 mg/dL (ref 0–99)
Total CHOL/HDL Ratio: 2.6 RATIO
Triglycerides: 118 mg/dL (ref <150)
VLDL: 24 mg/dL (ref 0–40)

## 2010-12-12 LAB — RENAL FUNCTION PANEL
Albumin: 3.1 g/dL — ABNORMAL LOW (ref 3.5–5.2)
BUN: 25 mg/dL — ABNORMAL HIGH (ref 6–23)
Calcium: 9.3 mg/dL (ref 8.4–10.5)
Creatinine, Ser: 5.42 mg/dL — ABNORMAL HIGH (ref 0.4–1.2)
GFR calc Af Amer: 10 mL/min — ABNORMAL LOW (ref >60)
GFR calc non Af Amer: 9 mL/min — ABNORMAL LOW (ref >60)
Phosphorus: 4.9 mg/dL — ABNORMAL HIGH (ref 2.3–4.6)

## 2010-12-12 LAB — COMPREHENSIVE METABOLIC PANEL
ALT: 19 U/L (ref 0–35)
Albumin: 3.3 g/dL — ABNORMAL LOW (ref 3.5–5.2)
Alkaline Phosphatase: 108 U/L (ref 39–117)
Glucose, Bld: 913 mg/dL (ref 70–99)
Potassium: 6.8 mEq/L (ref 3.5–5.1)
Sodium: 121 mEq/L — ABNORMAL LOW (ref 135–145)
Total Protein: 6.7 g/dL (ref 6.0–8.3)

## 2010-12-12 LAB — URINE MICROSCOPIC-ADD ON

## 2010-12-12 LAB — URINALYSIS, ROUTINE W REFLEX MICROSCOPIC
Leukocytes, UA: NEGATIVE
Protein, ur: >300 mg/dL — AB
Urobilinogen, UA: 0.2 mg/dL (ref 0.0–1.0)

## 2010-12-12 LAB — RAPID URINE DRUG SCREEN, HOSP PERFORMED
Cocaine: NOT DETECTED
Opiates: NOT DETECTED

## 2010-12-12 LAB — POCT I-STAT 3, ART BLOOD GAS (G3+)
Bicarbonate: 13.9 mEq/L — ABNORMAL LOW (ref 20.0–24.0)
O2 Saturation: 83 %
pCO2 arterial: 28.6 mmHg — ABNORMAL LOW (ref 35.0–45.0)
pO2, Arterial: 51 mmHg — ABNORMAL LOW (ref 80.0–100.0)

## 2010-12-12 LAB — CARDIAC PANEL(CRET KIN+CKTOT+MB+TROPI)
CK, MB: 1.3 ng/mL (ref 0.3–4.0)
CK, MB: 1.7 ng/mL (ref 0.3–4.0)
Relative Index: INVALID (ref 0.0–2.5)
Relative Index: INVALID (ref 0.0–2.5)
Total CK: 70 U/L (ref 7–177)
Total CK: 80 U/L (ref 7–177)
Troponin I: 0.03 ng/mL (ref 0.00–0.06)
Troponin I: 0.04 ng/mL (ref 0.00–0.06)
Troponin I: 0.06 ng/mL (ref 0.00–0.06)

## 2010-12-12 LAB — CULTURE, BLOOD (ROUTINE X 2): Culture: NO GROWTH

## 2010-12-12 LAB — DIFFERENTIAL
Eosinophils Relative: 0 % (ref 0–5)
Lymphocytes Relative: 3 % — ABNORMAL LOW (ref 12–46)
Lymphs Abs: 0.3 10*3/uL — ABNORMAL LOW (ref 0.7–4.0)
Monocytes Absolute: 0.1 10*3/uL (ref 0.1–1.0)
Monocytes Relative: 1 % — ABNORMAL LOW (ref 3–12)

## 2010-12-12 LAB — POCT I-STAT 4, (NA,K, GLUC, HGB,HCT)
HCT: 44 % (ref 36.0–46.0)
Hemoglobin: 15 g/dL (ref 12.0–15.0)
Potassium: 5.4 mEq/L — ABNORMAL HIGH (ref 3.5–5.1)
Sodium: 126 mEq/L — ABNORMAL LOW (ref 135–145)

## 2010-12-12 LAB — ACETAMINOPHEN LEVEL: Acetaminophen (Tylenol), Serum: <10 ug/mL — ABNORMAL LOW (ref 10–30)

## 2010-12-12 LAB — TSH: TSH: 9.908 u[IU]/mL — ABNORMAL HIGH (ref 0.350–4.500)

## 2010-12-12 LAB — LACTIC ACID, PLASMA: Lactic Acid, Venous: 2.5 mmol/L — ABNORMAL HIGH (ref 0.5–2.2)

## 2010-12-12 LAB — ALBUMIN: Albumin: 2.8 g/dL — ABNORMAL LOW (ref 3.5–5.2)

## 2010-12-12 LAB — URINE CULTURE: Culture: NO GROWTH

## 2010-12-12 LAB — KETONES, QUALITATIVE

## 2010-12-12 LAB — T4, FREE: Free T4: 1.04 ng/dL (ref 0.80–1.80)

## 2010-12-12 LAB — HEMOGLOBIN A1C
Hgb A1c MFr Bld: 9.6 % — ABNORMAL HIGH (ref 4.6–6.1)
Mean Plasma Glucose: 229 mg/dL

## 2010-12-12 LAB — HEPATITIS B SURFACE ANTIGEN: Hepatitis B Surface Ag: NEGATIVE

## 2010-12-12 LAB — OSMOLALITY: Osmolality: 334 mOsm/kg — ABNORMAL HIGH (ref 275–300)

## 2010-12-12 LAB — PHOSPHORUS: Phosphorus: 4.3 mg/dL (ref 2.3–4.6)

## 2010-12-12 LAB — HIV ANTIBODY (ROUTINE TESTING W REFLEX): HIV: NONREACTIVE

## 2010-12-19 ENCOUNTER — Ambulatory Visit (HOSPITAL_BASED_OUTPATIENT_CLINIC_OR_DEPARTMENT_OTHER): Payer: Medicare Other

## 2010-12-20 ENCOUNTER — Emergency Department (HOSPITAL_COMMUNITY): Payer: Medicare Other

## 2010-12-20 ENCOUNTER — Emergency Department (HOSPITAL_COMMUNITY)
Admission: EM | Admit: 2010-12-20 | Discharge: 2010-12-20 | Disposition: A | Discharge status: Home / Self Care | Payer: Medicare Other | Attending: Emergency Medicine | Admitting: Emergency Medicine

## 2010-12-20 DIAGNOSIS — Z794 Long term (current) use of insulin: Secondary | ICD-10-CM | POA: Insufficient documentation

## 2010-12-20 DIAGNOSIS — S8010XA Contusion of unspecified lower leg, initial encounter: Secondary | ICD-10-CM | POA: Insufficient documentation

## 2010-12-20 DIAGNOSIS — I10 Essential (primary) hypertension: Secondary | ICD-10-CM | POA: Insufficient documentation

## 2010-12-20 DIAGNOSIS — M79609 Pain in unspecified limb: Secondary | ICD-10-CM | POA: Insufficient documentation

## 2010-12-20 DIAGNOSIS — M7989 Other specified soft tissue disorders: Secondary | ICD-10-CM | POA: Insufficient documentation

## 2010-12-20 DIAGNOSIS — Z9889 Other specified postprocedural states: Secondary | ICD-10-CM | POA: Insufficient documentation

## 2010-12-20 DIAGNOSIS — W2203XA Walked into furniture, initial encounter: Secondary | ICD-10-CM | POA: Insufficient documentation

## 2010-12-20 DIAGNOSIS — E119 Type 2 diabetes mellitus without complications: Secondary | ICD-10-CM | POA: Insufficient documentation

## 2010-12-20 DIAGNOSIS — Z79899 Other long term (current) drug therapy: Secondary | ICD-10-CM | POA: Insufficient documentation

## 2010-12-20 DIAGNOSIS — E039 Hypothyroidism, unspecified: Secondary | ICD-10-CM | POA: Insufficient documentation

## 2010-12-20 DIAGNOSIS — Y92009 Unspecified place in unspecified non-institutional (private) residence as the place of occurrence of the external cause: Secondary | ICD-10-CM | POA: Insufficient documentation

## 2010-12-26 ENCOUNTER — Inpatient Hospital Stay (HOSPITAL_COMMUNITY)
Admission: EM | Admit: 2010-12-26 | Discharge: 2010-12-30 | DRG: 602 | Disposition: A | Discharge status: Home Health | Payer: Medicare Other | Attending: Internal Medicine | Admitting: Internal Medicine

## 2010-12-26 DIAGNOSIS — Z87891 Personal history of nicotine dependence: Secondary | ICD-10-CM

## 2010-12-26 DIAGNOSIS — L02419 Cutaneous abscess of limb, unspecified: Principal | ICD-10-CM | POA: Diagnosis present

## 2010-12-26 DIAGNOSIS — E669 Obesity, unspecified: Secondary | ICD-10-CM | POA: Diagnosis present

## 2010-12-26 DIAGNOSIS — E1129 Type 2 diabetes mellitus with other diabetic kidney complication: Secondary | ICD-10-CM | POA: Diagnosis present

## 2010-12-26 DIAGNOSIS — Z794 Long term (current) use of insulin: Secondary | ICD-10-CM

## 2010-12-26 DIAGNOSIS — E440 Moderate protein-calorie malnutrition: Secondary | ICD-10-CM | POA: Diagnosis present

## 2010-12-26 DIAGNOSIS — S98139A Complete traumatic amputation of one unspecified lesser toe, initial encounter: Secondary | ICD-10-CM

## 2010-12-26 DIAGNOSIS — N186 End stage renal disease: Secondary | ICD-10-CM | POA: Diagnosis present

## 2010-12-26 DIAGNOSIS — I120 Hypertensive chronic kidney disease with stage 5 chronic kidney disease or end stage renal disease: Secondary | ICD-10-CM | POA: Diagnosis present

## 2010-12-26 DIAGNOSIS — D631 Anemia in chronic kidney disease: Secondary | ICD-10-CM | POA: Diagnosis present

## 2010-12-26 DIAGNOSIS — IMO0002 Reserved for concepts with insufficient information to code with codable children: Secondary | ICD-10-CM

## 2010-12-26 DIAGNOSIS — Z79899 Other long term (current) drug therapy: Secondary | ICD-10-CM

## 2010-12-26 DIAGNOSIS — Z940 Kidney transplant status: Secondary | ICD-10-CM

## 2010-12-26 DIAGNOSIS — N039 Chronic nephritic syndrome with unspecified morphologic changes: Secondary | ICD-10-CM | POA: Diagnosis present

## 2010-12-26 DIAGNOSIS — L03119 Cellulitis of unspecified part of limb: Principal | ICD-10-CM | POA: Diagnosis present

## 2010-12-26 DIAGNOSIS — E785 Hyperlipidemia, unspecified: Secondary | ICD-10-CM | POA: Diagnosis present

## 2010-12-26 DIAGNOSIS — B952 Enterococcus as the cause of diseases classified elsewhere: Secondary | ICD-10-CM | POA: Diagnosis present

## 2010-12-26 DIAGNOSIS — E1165 Type 2 diabetes mellitus with hyperglycemia: Secondary | ICD-10-CM | POA: Diagnosis present

## 2010-12-26 DIAGNOSIS — E039 Hypothyroidism, unspecified: Secondary | ICD-10-CM | POA: Diagnosis present

## 2010-12-27 LAB — COMPREHENSIVE METABOLIC PANEL
Alkaline Phosphatase: 112 U/L (ref 39–117)
BUN: 20 mg/dL (ref 6–23)
BUN: 23 mg/dL (ref 6–23)
CO2: 22 mEq/L (ref 19–32)
Calcium: 10.2 mg/dL (ref 8.4–10.5)
Chloride: 106 mEq/L (ref 96–112)
Chloride: 106 mEq/L (ref 96–112)
Creatinine, Ser: 1.61 mg/dL — ABNORMAL HIGH (ref 0.4–1.2)
GFR calc non Af Amer: 35 mL/min — ABNORMAL LOW (ref >60)
Glucose, Bld: 119 mg/dL — ABNORMAL HIGH (ref 70–99)
Glucose, Bld: 374 mg/dL — ABNORMAL HIGH (ref 70–99)
Potassium: 4.3 mEq/L (ref 3.5–5.1)
Total Bilirubin: 0.3 mg/dL (ref 0.3–1.2)
Total Bilirubin: 0.5 mg/dL (ref 0.3–1.2)

## 2010-12-27 LAB — DIFFERENTIAL
Eosinophils Relative: 2 % (ref 0–5)
Lymphocytes Relative: 9 % — ABNORMAL LOW (ref 12–46)
Lymphs Abs: 0.5 10*3/uL — ABNORMAL LOW (ref 0.7–4.0)
Lymphs Abs: 0.7 10*3/uL (ref 0.7–4.0)
Monocytes Absolute: 0.6 10*3/uL (ref 0.1–1.0)
Monocytes Relative: 10 % (ref 3–12)
Monocytes Relative: 16 % — ABNORMAL HIGH (ref 3–12)
Neutro Abs: 4.9 10*3/uL (ref 1.7–7.7)
Neutrophils Relative %: 77 % (ref 43–77)

## 2010-12-27 LAB — URINALYSIS, ROUTINE W REFLEX MICROSCOPIC
Bilirubin Urine: NEGATIVE
Leukocytes, UA: NEGATIVE
Nitrite: NEGATIVE
Specific Gravity, Urine: 1.025 (ref 1.005–1.030)
Urobilinogen, UA: 0.2 mg/dL (ref 0.0–1.0)

## 2010-12-27 LAB — CBC
HCT: 36.1 % (ref 36.0–46.0)
Hemoglobin: 12.9 g/dL (ref 12.0–15.0)
MCH: 28.4 pg (ref 26.0–34.0)
MCHC: 32.7 g/dL (ref 30.0–36.0)
MCV: 86.8 fL (ref 78.0–100.0)
MCV: 88.3 fL (ref 78.0–100.0)
RBC: 4.09 MIL/uL (ref 3.87–5.11)
RBC: 4.55 MIL/uL (ref 3.87–5.11)
RDW: 13.1 % (ref 11.5–15.5)
WBC: 5.5 10*3/uL (ref 4.0–10.5)

## 2010-12-27 LAB — GLUCOSE, CAPILLARY: Glucose-Capillary: 345 mg/dL — ABNORMAL HIGH (ref 70–99)

## 2010-12-27 LAB — URINE MICROSCOPIC-ADD ON

## 2010-12-27 LAB — HEMOGLOBIN A1C: Mean Plasma Glucose: 232 mg/dL — ABNORMAL HIGH (ref <117)

## 2010-12-27 LAB — PHOSPHORUS: Phosphorus: 2.7 mg/dL (ref 2.3–4.6)

## 2010-12-27 LAB — MRSA PCR SCREENING: MRSA by PCR: NEGATIVE

## 2010-12-27 LAB — MAGNESIUM: Magnesium: 1.6 mg/dL (ref 1.5–2.5)

## 2010-12-28 LAB — DIFFERENTIAL
Eosinophils Absolute: 0.2 10*3/uL (ref 0.0–0.7)
Eosinophils Relative: 5 % (ref 0–5)
Lymphs Abs: 0.4 10*3/uL — ABNORMAL LOW (ref 0.7–4.0)
Monocytes Relative: 11 % (ref 3–12)

## 2010-12-28 LAB — COMPREHENSIVE METABOLIC PANEL
ALT: 14 U/L (ref 0–35)
Alkaline Phosphatase: 101 U/L (ref 39–117)
CO2: 25 mEq/L (ref 19–32)
Calcium: 9.7 mg/dL (ref 8.4–10.5)
GFR calc non Af Amer: 38 mL/min — ABNORMAL LOW (ref >60)
Glucose, Bld: 359 mg/dL — ABNORMAL HIGH (ref 70–99)
Sodium: 132 mEq/L — ABNORMAL LOW (ref 135–145)
Total Bilirubin: 0.6 mg/dL (ref 0.3–1.2)

## 2010-12-28 LAB — CBC
MCH: 28.2 pg (ref 26.0–34.0)
MCV: 89 fL (ref 78.0–100.0)
Platelets: 176 10*3/uL (ref 150–400)
RBC: 4.26 MIL/uL (ref 3.87–5.11)
RDW: 13.1 % (ref 11.5–15.5)

## 2010-12-28 LAB — GLUCOSE, CAPILLARY
Glucose-Capillary: 328 mg/dL — ABNORMAL HIGH (ref 70–99)
Glucose-Capillary: 70 mg/dL (ref 70–99)
Glucose-Capillary: 211 mg/dL — ABNORMAL HIGH (ref 70–99)

## 2010-12-28 LAB — MAGNESIUM: Magnesium: 1.9 mg/dL (ref 1.5–2.5)

## 2010-12-29 LAB — CBC
MCHC: 31.5 g/dL (ref 30.0–36.0)
Platelets: 210 10*3/uL (ref 150–400)
RDW: 13.2 % (ref 11.5–15.5)
WBC: 6.9 10*3/uL (ref 4.0–10.5)

## 2010-12-29 LAB — GLUCOSE, CAPILLARY
Glucose-Capillary: 214 mg/dL — ABNORMAL HIGH (ref 70–99)
Glucose-Capillary: 76 mg/dL (ref 70–99)
Glucose-Capillary: 155 mg/dL — ABNORMAL HIGH (ref 70–99)
Glucose-Capillary: 81 mg/dL (ref 70–99)

## 2010-12-29 LAB — BASIC METABOLIC PANEL
BUN: 19 mg/dL (ref 6–23)
Calcium: 9.6 mg/dL (ref 8.4–10.5)
GFR calc non Af Amer: 41 mL/min — ABNORMAL LOW (ref >60)
Potassium: 4.5 mEq/L (ref 3.5–5.1)
Sodium: 133 mEq/L — ABNORMAL LOW (ref 135–145)

## 2010-12-30 LAB — WOUND CULTURE: Gram Stain: NONE SEEN

## 2010-12-30 LAB — RENAL FUNCTION PANEL
Albumin: 3 g/dL — ABNORMAL LOW (ref 3.5–5.2)
CO2: 25 mEq/L (ref 19–32)
Calcium: 9.8 mg/dL (ref 8.4–10.5)
Creatinine, Ser: 1.43 mg/dL — ABNORMAL HIGH (ref 0.4–1.2)
GFR calc Af Amer: 48 mL/min — ABNORMAL LOW (ref >60)
GFR calc non Af Amer: 40 mL/min — ABNORMAL LOW (ref >60)
Phosphorus: 3.1 mg/dL (ref 2.3–4.6)
Sodium: 138 mEq/L (ref 135–145)

## 2010-12-30 LAB — CBC
MCV: 89 fL (ref 78.0–100.0)
Platelets: 200 10*3/uL (ref 150–400)
RBC: 3.9 MIL/uL (ref 3.87–5.11)
RDW: 13.3 % (ref 11.5–15.5)
WBC: 5 10*3/uL (ref 4.0–10.5)

## 2010-12-30 LAB — DIFFERENTIAL
Basophils Absolute: 0 10*3/uL (ref 0.0–0.1)
Eosinophils Absolute: 0.2 10*3/uL (ref 0.0–0.7)
Eosinophils Relative: 5 % (ref 0–5)
Lymphs Abs: 0.6 10*3/uL — ABNORMAL LOW (ref 0.7–4.0)
Neutrophils Relative %: 76 % (ref 43–77)

## 2010-12-30 LAB — GLUCOSE, CAPILLARY
Glucose-Capillary: 57 mg/dL — ABNORMAL LOW (ref 70–99)
Glucose-Capillary: 139 mg/dL — ABNORMAL HIGH (ref 70–99)

## 2010-12-30 LAB — MAGNESIUM: Magnesium: 1.6 mg/dL (ref 1.5–2.5)

## 2010-12-30 LAB — TACROLIMUS LEVEL: Tacrolimus (FK506) - LabCorp: 7.5 ng/mL

## 2010-12-30 NOTE — H&P (Addendum)
[name], [name]              ACCOUNT NO.:  [id number]  MEDICAL RECORD NO.:  [id number]           PATIENT TYPE:  E  LOCATION:  [facility]                         FACILITY:  [facility]  PHYSICIAN:  Ollen Gross. Vernell Morgans, M.D. DATE OF BIRTH:  Dec 12, 1965  DATE OF ADMISSION:  12/26/2010 DATE OF DISCHARGE:                             HISTORY & PHYSICAL   PRIMARY CARE PROVIDER:  Terrial Rhodes, MD of [facility].  CHIEF COMPLAINT:  Left leg swelling and pain.  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  The patient last Wednesday had bumped her leg against a tool and since then has been having swelling and reddening around her left tibial area.  She denies any fevers or chills, but the pain has progressed and brought into emergency department.  She has had worsening with swollen around the site.  Otherwise, no nausea, no vomiting, no chest pains, no significant shortness of breath, although she occasionally has dyspnea on exertion which is chronic.  Otherwise review of systems is negative.  PAST MEDICAL HISTORY:  Significant for: 1. Diabetes, poorly controlled. 2. Hypertension. 3. Hyperlipidemia. 4. End-stage renal disease secondary to above, status post renal     transplant.  SOCIAL HISTORY:  The patient lives with her spouse and does not smoke or drink or abuse drugs.  FAMILY HISTORY:  Noncontributory.  ALLERGIES:  To LISINOPRIL.  MEDICATIONS: 1. CellCept 2000 mg b.i.d.  She skipped her afternoon dose. 2. Coreg 37.5 two tablets p.o. b.i.d. 3. Lantus 40 units twice a daily. 4. NovoLog 8 units t.i.d. 5. Prednisone 5 mg daily. 6. Prograf 1 mg b.i.d. 7. Ranitidine 150 mg daily. 8. Synthroid 137 mcg daily.  PHYSICAL EXAMINATION:  VITAL SIGNS:  Temperature 98.1, blood pressure 119/67, pulse 88,  respirations 20, satting 94% on room air. GENERAL:  The patient appears to be in no acute distress, obese female. HEAD:  Nontraumatic.  Moist mucous membranes. LUNGS:  Clear to auscultation bilaterally, although somewhat distant. HEART:  Regular rate and rhythm.  No murmurs appreciated. ABDOMEN:  Obese.  There is a well-healed scar from a transplant, nontender kidney. EXTREMITIES:  Right lower extremity without clubbing, cyanosis or edema and left lower extremity with significant edema up to mid knee with erythema surrounding eschar with some pustular discharge. NEUROLOGIC:  Grossly intact.  LABORATORY DATA:  White blood cell count 6.3, hemoglobin 12.9.  Sodium 134, potassium 4.8, creatinine 1.61, alk phos 145, albumin 3.6.  UA showing more than 1000 of glucose, blood sugar 345.  Lactobacillus is unremarkable.  ASSESSMENT/PLAN:  This is a 45 year old female with history of diabetes, hypertension with end-stage renal disease with cellulitis. 1. Cellulitis.  Treat her with vancomycin, and given that she is    diabetic she would probably add Zosyn given extension of her     disease and severe infection. 2. We will try to obtain wound cultures.  The patient is not febrile.     We will not get blood cultures. 3. History of end-stage  renal disease, status post transplant.  We     will continue home meds.  Would need to left renal note if she is     here. 4. Diabetes.  This is very poorly controlled.  We will make sure she     is on sliding scale.  Continue her Lantus, may need to put glucose     stabilizer if this continues to worsen. 5. Hypertension.  Continue Coreg. 6. Prophylaxis, Protonix and Lovenox.     Michiel Cowboy, MD   ______________________________ Ollen Gross. Vernell Morgans, M.D.    AVD/MEDQ  D:  12/27/2010  T:  12/27/2010  Job:  [id number]  cc:   Terrial Rhodes, M.D.  Electronically Signed by Chevis Pretty III M.D. on 12/30/2010 09:32:26 AM Electronically Signed by  Therisa Doyne MD on 01/03/2011 09:43:06 PM

## 2010-12-31 LAB — TISSUE CULTURE

## 2010-12-31 NOTE — Discharge Summary (Signed)
NAMELACHLAN, [name]              ACCOUNT NO.:  [id number]  MEDICAL RECORD NO.:  [id number]           PATIENT TYPE:  I  LOCATION:  6729                         FACILITY:  [facility]  PHYSICIAN:  Marinda Elk, M.D.DATE OF BIRTH:  [birth year]  DATE OF ADMISSION:  12/26/2010 DATE OF DISCHARGE:  12/30/2010                              DISCHARGE SUMMARY   NEPHROLOGIST:  Terrial Rhodes, MD  DISCHARGE DIAGNOSES: 1. Full thick black eschar with cellulitis on the left pretibial     region status post I&D.  Wound cultures showed Enterococcus     sensitive to ampicillin. 2. End-stage renal disease status post renal transplant. 3. Uncontrolled diabetes mellitus. 4. Hypothyroidism.  DISCHARGE MEDICATIONS: 1. Augmentin 500 mg 1 tablet b.i.d. for 10 days. 2. OxyIR 5 mg q.4 h. p.r.n. 3. Amlodipine 5 mg daily. 4. Aspirin 225 mg daily. 5. Bactrim 400/80 mg 1 tablet Monday, Wednesday and Friday. 6. CellCept 250 mg 2 tabs b.i.d.7. Coreg 25 mg 2 tablets b.i.d. 8. Lantus 40 units in the morning, 40 units b.i.d. 9. NovoLog sliding scale. 10.Prednisone 5 mg daily. 11.Prograf 1 tablet b.i.d. 12.Ranitidine 150 mg at bedtime. 13.Synthroid 137 mcg daily. 14.Zoloft 25 mg in the morning.  PROCEDURES PERFORMED: 1. Full-thickness scar excision with placement of wound VAC. 2. X-ray of the tibia that showed no evidence of bony abnormalities.  BRIEF ADMITTING HISTORY AND PHYSICAL:  This is a 45 year old female with past medical history of end-stage renal disease status post transplant in April 2011, history of diabetes and hypertension, comes in for leg swelling, redness in the tibial area with an eschar and tender to palpation.  Please refer to dictation from December 30, 2010 for other details.  PHYSICAL EXAMINATION:  VITAL SIGNS:  Temperature 98, blood pressure 119/67, pulse of 88, respirations 20.  She was satting 94% on room air. GENERAL:  The patient appears in no acute distress. HEENT:   Head: Nontraumatic.  Moist mucous membrane. LUNGS:  Good air movement.  Clear to auscultation. CARDIOVASCULAR:  Regular rate and rhythm.  No appreciated murmurs. ABDOMEN:  Obese.  A well-healed scar from transplant, nontender kidney. EXTREMITIES:  Right lower extremity without clubbing, cyanosis, or edema.  The left lower extremity showed significant edema up to the knee with erythema surrounding the eschar and some pustular discharge. NEUROLOGICAL:  Nonfocal.  LABS ON ADMISSION:  White count 6.3, hemoglobin of 12, sodium 134, potassium 4.8, creatinine 1.6, alkaline phosphatase 145, albumin 3.1. UA showed more than 1000 glucose.  ASSESSMENT/PLAN: 1. Skin infection with full thickness black eschar with cellulitis.     She was started on vancomycin and Zosyn.  Surgery was consulted.     They did an I&D, placed a wound VAC, she was doing better.  Wound     culture grew Enterococcus that is sensitive to ampicillin.  She was     switched to Augmentin which she would take for 10 more days. 2. End-stage renal disease per Renal.  She will follow up with Dr.     Terrial Rhodes on June 1.  No changes were made. 3. Diabetes type 2.  At the beginning, she  was uncontrolled.  Her     medications were started, blood glucose started to improve.  She had one     episode of hypoglycemia before discharge of 57 and this resolved     with foods.  So she will go back on her home dose. 4. Hypothyroidism, currently stable.  No changes were made. 5. Moderate protein malnutrition.  She has been talked about her diet     and this will be continued to follow up by her nephrologist as an     outpatient.  Vitals on day of discharge shows temperature 97, pulse 75, respirations 18, blood pressure 136/77.  She was satting 98% on room air.  Labs on day of discharge shows wound culture, Enterococcus species sensitive to ampicillin.  Sodium 138, potassium 4.8, chloride of 108, bicarb 25, glucose of 83, BUN of 17,  creatinine 1.4, albumin 3.0.  Her mag is 1.6, white count of 5.0, hemoglobin of 10.7, platelet count of 200,000, ANC 3.8.     Marinda Elk, M.D.     AF/MEDQ  D:  12/30/2010  T:  12/30/2010  Job:  [id number]  cc:   Terrial Rhodes, M.D.  Electronically Signed by Marinda Elk M.D. on 12/31/2010 05:09:42 PM

## 2011-01-03 LAB — ANAEROBIC CULTURE

## 2011-01-03 NOTE — Consult Note (Signed)
NAMELEARA, RAWL              ACCOUNT NO.:  [id number]  MEDICAL RECORD NO.:  [id number]           PATIENT TYPE:  LOCATION:                                 FACILITY:  PHYSICIAN:  [name], M.D.DATE OF BIRTH:  1966/06/03  DATE OF CONSULTATION:  12/27/2010 DATE OF DISCHARGE:                                CONSULTATION   REASON FOR CONSULT:  Assist with renal transplant management.  HISTORY:  The patient is a 45 year old female admitted with cellulitis who has a renal transplant from April 2011.  The patient was admitted earlier this morning after presenting with progressive swelling and redness of the left lower leg.  She had bumped her shin recently at home and developed progressing pain and redness after that.  She denied any fever, chills, or sweats and the area was not draining at home, however, it has drained some here in the hospital.  She was seen by General Surgery today as well as receiving IV antibiotics.  They are planning to take her to the Surgery for I and D in the morning.  The patient had a cadaveric renal transplant in April 2011.  It was complicated by a wound infection which required wound VAC therapy for about a month, according to the patient.  She takes CellCept 500 t.i.d., Prograf 1 mg b.i.d., and prednisone 5 daily and these have been confirmed with the patient's husband who manages all her medications. The patient says her memory is not too good.  She has not had any significant medication side effects with the transplant medications.  PAST MEDICAL HISTORY: 1. ESRD with renal transplant as above. 2. Hypertension. 3. Diabetes mellitus, type 2. 4. Anemia of chronic kidney disease. 5. Hypothyroidism.  MEDICATIONS: 1. CellCept 500 b.i.d. 2. Prograf 1 mg b.i.d. 3. Prednisone 5 daily. 4. Coreg 37.5 b.i.d. 5. Lantus 40 units b.i.d. 6. NovoLog 8 units t.i.d. 7. Zantac 150 daily. 8. Synthroid 137 mcg daily.  ALLERGIES:   LISINOPRIL.  SOCIAL HISTORY:  Lives with her spouse.  No alcohol or drug use, quit smoking 10 years ago.  FAMILY HISTORY:  Noncontributory.  REVIEW OF SYSTEMS:  Denies any recent headache, fever, chills, sweats, visual change, sore throat, difficulty swallowing.  Denies any recent chest pain, shortness of breath, nausea, vomiting, diarrhea, abdominal pain, dysuria, change in color of the urine.  Denies any focal weakness or numbness, skin rash, or itching.  PHYSICAL EXAMINATION:  VITAL SIGNS:  Temperature 97.7, blood pressure 120/70, pulse 80, respirations 20, O2 sat 94% on room air. GENERAL:  The patient is alert and oriented, in no distress. SKIN:  Warm and dry without rash, cyanosis, or edema. HEENT:  PERRL, EOMI.  Throat is clear and moist. NECK:  Supple without JVD. CHEST:  Clear throughout.  No rales, rhonchi, or wheezing. CARDIAC:  Regular rate and rhythm without murmur, rub, or gallop. ABDOMEN:  Obese, soft, nontender, active bowel sounds.  Well-healed scar in the right lower abdomen and no tenderness over the transplant in the right lower quadrant. RECTAL AND GENITOURINARY:  Deferred. EXTREMITIES:  No joint effusion or deformity.  There is significant erythema and  edema in the left leg below the knee.  It is most intense in the distal lower leg area.  There is some scant drainage on the bandage.  The right leg shows no signs of any erythema or edema. NEUROLOGIC:  Alert and oriented x3.  No focal deficits.  LABORATORY DATA:  White blood count 5000, hemoglobin 11, platelets 212, BUN 20, creatinine 1.3, potassium 4.3, albumin 3.2.  Urinalysis negative.  Phosphorous 2.7.  IMPRESSION: 1. End-stage renal disease with cadaveric renal transplant from April     2011.  Stable creatinine. 2. Cellulitis with abscess of the left lower leg.  On vancomycin and     Zosyn.  For incision and drainage in the morning per Surgery. 3. Diabetes mellitus, type 2, per primary service. 4.  Hypothyroidism. 5. Hypertension, on Coreg.  RECOMMENDATIONS: 1. Agree with current dosing of transplant meds.  We will order a     trough Prograf level in the morning.  This will take a few days to     return. 2. Continue IV fluids for now. 3. I will follow.     [name], M.D.     RDS/MEDQ  D:  12/27/2010  T:  12/28/2010  Job:  [id number]  Electronically Signed by Delano Metz M.D. on 01/03/2011 04:56:25 PM

## 2011-01-05 ENCOUNTER — Encounter (HOSPITAL_BASED_OUTPATIENT_CLINIC_OR_DEPARTMENT_OTHER): Payer: Medicare Other

## 2011-01-08 NOTE — Op Note (Signed)
  [name], [name]              ACCOUNT NO.:  [id number]  MEDICAL RECORD NO.:  [id number]           PATIENT TYPE:  I  LOCATION:  6729                         FACILITY:  [facility]  PHYSICIAN:  Thornton Park. Daphine Deutscher, MD  DATE OF BIRTH:  [birth year]  DATE OF PROCEDURE:  12/28/2010 DATE OF DISCHARGE:                              OPERATIVE REPORT   PREOPERATIVE DIAGNOSIS:  Full-thickness black eschar on the left pretibial region, leg.  PROCEDURE:  Full-thickness eschar excision with placement of wound vac. Skin sent for culture.  SURGEON:  Thornton Park. Daphine Deutscher, MD  ANESTHESIA:  General.  DESCRIPTION OF PROCEDURE:  [name] is a 45 year old lady who has a kidney transplant in place and is currently on antirejection meds, tacrolimus and CellCept as well as prednisone for her kidney.  She is not on hemodialysis.  She apparently bumped her leg and then developed this large eschar over the left pretibial region.  We saw her with Dr. Lindie Spruce yesterday and we went ahead and put her on today for debridement since she had eaten yesterday.  After the patient was placed to sleep and an appropriate time-out, the marked leg which was obvious in terms of the size of this lesion, it was about the size a large leaf about 6 cm long x about 3-4 cm and irregular, was all prepped with Betadine and draped sterilely.  Sharp 15 blade dissection took me through the skin and into this hemorrhagic area where it appeared that there was some clot that had undermined the skin leading to its death.  We went ahead and sharply debrided this.  There was no bleeding really at the base.  I cleaned out the dead tissues, sent a large piece for culture.  Once this was nicely debrided, I elected to place a wound vac on it.  I cut the wound vac so that it was about a centimeter deep and applied it within the geographic bounds of this irregular wound.  This was then placed and put to suction.  The patient will be taken to  recovery room and returned to the floor where she can return to her preoperative orders.     Thornton Park Daphine Deutscher, MD     MBM/MEDQ  D:  12/28/2010  T:  12/28/2010  Job:  [id number]  Electronically Signed by Luretha Murphy MD on 01/08/2011 08:42:53 AM

## 2011-01-20 NOTE — Discharge Summary (Signed)
[name], [name]              ACCOUNT NO.:  [id number]   MEDICAL RECORD NO.:  [id number]          PATIENT TYPE:  INP   LOCATION:  5599                         FACILITY:  [facility]   PHYSICIAN:  [name], M.D.DATE OF BIRTH:  [birth year]   DATE OF ADMISSION:  01/24/2007  DATE OF DISCHARGE:  01/24/2007                               DISCHARGE SUMMARY   ADMISSION DIAGNOSES:  1. Hyperkalemia/acidosis secondary to missed hemodialysis.  2. Volume overload secondary to missed hemodialysis.  3. Hyperglycemia with uncontrolled diabetes mellitus.  4. Left eye conjunctivitis.  5. Endstage renal disease on chronic hemodialysis.   DISCHARGE DIAGNOSES:  1. Hyperkalemia and acidosis secondary to missed hemodialysis.  2. Left eye conjunctivitis.  3. Hyperglycemia with history of uncontrolled diabetes mellitus.  4. Endstage renal disease requiring hemodialysis.  5. Insulin-dependent diabetes mellitus.   BRIEF HISTORY:  Included in the admission.  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  She missed her last hemodialysis on Friday, Jan 21, 2007, with  her last day of hemodialysis 5 days ago at the [facility].  She reports a right eye pain with purulent discharge and  erythema since starting 2 days ago.  Denies any fevers.  She has been  seen by Dr. Ashley Royalty at [facility] ophthalmology, status post  vitrectomy in the past.   PHYSICAL EXAM:  VITAL SIGNS:  On admission, temperature was 97.3 oral,  pulse was 78 and regular, respirations 20, blood pressure 161/83.  GENERAL APPEARANCE:  Obese, alert white female in no acute distress but  anxious because of left eye pain.  HEENT:  Head is normocephalic.  Eye exam:  Reveals left eye with  purulent discharge and erythema.  Had  edema at the orbital area.  She  has  purulent conjunctival and erythema in the conjunctival area.  Funduscopic exam was not able to be done.  Extraocular movements were  within normal limits.  Ears and nose:  No purulent discharge as well as  the orifice.  NECK:  Supple.  No palpable lymphadenopathy.  No lesions noted in the  pharynx.  CHEST EXAM:  Reveals faint rales bilaterally at the bases.  CARDIAC EXAM:  Regular rate and rhythm auscultated.  No rubs  appreciated.  ABDOMEN:  Obese.  Bowel sounds were normal, within normal limits.  Abdomen is soft, nontender.  EXTREMITIES:  Reveal right upper arm AV Gore-Tex graft with a positive  bruit.  Trace bipedal edema.   Chest x-ray shows normal bronchial thickening.   PERTINENT LABORATORY DATA:  Showed a sodium of 127, a low bicarb of  16.8, potassium was 5.1.  Serum glucose of 624.  Serum creatinine of  6.5.  Hemoglobin was 12.9, white count was 8.8.   The patient was admitted for acute hemodialysis because of her acidosis,  hyperkalemia, volume overload, and no hemodialysis available at the  outpatient center at the time she presented in the  ER.  Also will need  treatment for right eye conjunctivitis with ophthalmology being  consulted by telephone for further advice.   COURSE IN THE HOSPITAL:  1. The patient was admitted to the hemodialysis unit for acute      hemodialysis for improvement of her acidosis and volume overload.      She tolerated the hemodialysis well with blood pressure improving      to 116/55 at the time of discharge.  She was claimed stable for      discharge with further follow up at the [facility].  She was continued on her 70/30 insulin at 45 units subcu      q.a.m. and 40 units q.h.s.  Sliding scale insulin as needed also.      Serum glucose came back down to 514, potassium 4.3.   She was told to try to be more compliant with hemodialysis with her  acidosis, hyperglycemia thought to  be related to her missed  hemodialysis, in addition to infection in her eye.   1. Left eye conjunctivitis:  As discussed with [facility] on the      phone, she has to have followup with them in their office, 1:30      p.m. tomorrow morning.  We will use Ciloxan 2 drops every 4 hours      to the left eye before being seen by them.  They did not want to      patch the eye as discussed on telephone, but see them on the      following day.   MEDICATIONS AT THE TIME OF DISCHARGE:  1. Ciloxan 4 drops left eye every 4 hours until seen at ophthalmology      office the next day.  2. Tums Ultra each meal.  3. Norvasc 10 mg q.h.s.  4. Atenolol 50 mg b.i.d.  5. Clonidine 0.1 mg b.i.d.  6. Paxil 20 mg daily.  7. Synthroid 125 mcg daily.  8. Injection of insulin 70/30 with 45 units in the a.m. and 40 at      bedtime.   FOLLOWUP:  1. [facility] at 1:30 p.m. tomorrow.  2. [facility] on Mondays, Wednesdays, and Fridays      schedule and do not miss hemodialysis.      [name], P.A.    ______________________________  D[facility] Salvia Eliott [name], M.D.    DWZ/MEDQ  D:  04/13/2007  T:  04/14/2007  Job:  [id number]   cc:   [facility]  [facility]

## 2011-01-20 NOTE — Op Note (Signed)
[name], [name]              ACCOUNT NO.:  [id number]   MEDICAL RECORD NO.:  [id number]          PATIENT TYPE:  AMB   LOCATION:  DFTL                         FACILITY:  [facility]   PHYSICIAN:  Juleen China IV, MDDATE OF BIRTH:  [birth year]   DATE OF PROCEDURE:  06/16/2007  DATE OF DISCHARGE:                               OPERATIVE REPORT   PREOPERATIVE DIAGNOSIS:  Pseudoaneurysm of right forearm graft, question  arc infection.   POSTOPERATIVE DIAGNOSIS:  Pseudoaneurysm of the venous half of the  graft, no evidence of infection.   PROCEDURE PERFORMED:  1. Replacement of the venous half of the graft.  2. Venous thrombectomy.   TYPE OF ANESTHESIA:  Monitored anesthesia care.   BLOOD LOSS:  Minimal.   FINDINGS:  No evidence of infection within the graft.  The graft was  well incorporated.  A stenosis at the venous anastomosis was identified.  This required thrombectomy using a #4 Fogarty.   INDICATIONS FOR PROCEDURE:  This is a 45 year old female who has a right  forearm graft.  She presented to clinic 2 days ago with a question of  infection.  There was no obvious clinical evidence of infection,  however, the patient was noted to have a pseudoaneurysm within the  venous half of the graft.  The decision was made to take the patient to  the operating room to replace the venous half of the graft and evaluate  it intraoperatively for infections.  Risks and benefits were discussed.  Informed consent was signed.   PROCEDURE:  Patient identified in the holding area and taken to room 9.  She was placed supine on the table.  The right arm was prepped and  draped in the standard sterile fashion.  A timeout was called and  antibiotics were given.  A transverse incision was made over the venous  half of the graft.  The graft was then isolated and circumferentially  dissected.  A blue vessel loop was placed.  At this point, the graft was  noted to be well incorporated with no evidence  of infection.  Next, an  incision along the graft near the venous anastomosis was performed after  anesthetizing the skin with 1% lidocaine.  Again, circumferential  dissection of the graft was performed until adequate distance had been  achieved and acceptable for a new anastomosis.  Next, a straight  tunneler was changed to connect the two aspects of the graft and  pseudoaneurysms on the venous side.  Once the tunnel had been created, a  6-mm Gore-Tex graft was brought through the tunnel.  At this point in  time, the patient was systemically heparinized.  The graft was occluded  and transected.  The arterial side of the anastomosis was performed  using a running CV6 suture in an end-to-end fashion.  At the completion,  anastomosis clamp was released and there was brisk pulsatile flow  through the graft.  At this point in time, attention was turned towards  the venous side.  The graft here was transected and sewn end to end  using a CV6 gore suture.  Prior to completion of the anastomosis, the  graft was flushed in an anterior and a retrograde fashion.  I did have  to pass a #4 Fogarty through the venous side and was then evacuated a  mild amount of thrombus.  There appears to be a slight stenosis at the  venous end of the graft, however, there was good backbleeding after  performing a thrombectomy.  The anastomosis was then completed.  Hemostasis was achieved with the aid of Surgicel.  There was a great,  excellent thrill within the graft.  The  subcutaneous tissues were reapproximated using interrupted 3-0 Vicryl.  The skin was closed with running 4-0 Vicryl and Dermabond was placed.  The patient tolerated the procedure well and was taken to the recovery  room in stable condition.      [name] Ny, MD  Electronically Signed     VWB/MEDQ  D:  06/16/2007  T:  06/16/2007  Job:  [id number]

## 2011-01-20 NOTE — Discharge Summary (Signed)
[name], [name]              ACCOUNT NO.:  [id number]   MEDICAL RECORD NO.:  [id number]          PATIENT TYPE:  INP   LOCATION:  5528                         FACILITY:  [facility]   PHYSICIAN:  Mindi Slicker. Lowell Guitar, M.D.  DATE OF BIRTH:  25-May-1966   DATE OF ADMISSION:  03/06/2007  DATE OF DISCHARGE:  03/09/2007                               DISCHARGE SUMMARY   DISCHARGE DIAGNOSES:  1. Diabetic ketoacidosis on admission secondary to nonadherence,      improved  2. End-stage renal disease.  3. Hypertension.  4. Lumbar pain.  5. Anemia.  6. Secondary hyperparathyroidism.   PROCEDURES:  1. March 06, 2007: Head CT without contrast.  Impression: No acute      abnormality with paranasal sinusitis.  2. March 08, 2007: Right arm AV shuntogram and venous angioplasty.      Impression: Successful dilatation of the venous anastomosis  3. Hemodialysis.   CONSULTATIONS:  None.   HISTORY OF PRESENT ILLNESS:  [name] is a 45 year old Caucasian  female with past medical history significant for end-stage renal disease  secondary to diabetes mellitus who was last dialyzed on the Friday  before admission and presented to the emergency room with complaints of  rising blood sugars over the last 24 hours with accompanied headache,  nausea and vomiting.  She was also found to have increased lethargy with  a blood sugar of 958 and a serum bicarbonate of 15 with urinary ketones.  The patient is being admitted for management of elevated blood sugar.   HOSPITAL COURSE:  #1 - DIABETIC KETOACIDOSIS ON ADMISSION SECONDARY TO  NONADHERENCE NOW RESOLVED.  The patient was admitted for hyperglycemia  control.  She was started on the Glucomander per protocol with the  target range of blood sugars of 120-160 under a resistant scale.  Blood  sugars were improved over the next few days with target sugars 157-243.  On July 1, the patient was taken off the insulin drip transferred to  5500 and placed on 70/30  insulin.  The patient had a thorough nutrition  consultation regarding her hyperglycemia and diabetic diet.  Her insulin  was increased from her home dose of 230 units in the a.m. and 25 units  in the p.m.[name]  She has done fine with this during the admission.  It  would be optimal to add an oral agent to this regimen, but patient's  insurance would not cover this.  It was also believed that the simple  insulin regimen would be the best for [name].   #2 - END-STAGE RENAL DISEASE.  The patient continued on her Tuesday,  Thursday, Saturday schedule.  She did undergo a shuntogram with PTA as  stated above.  This was secondary to decreased access load as an  outpatient.  There was no further interventions here.   #3 - HYPERTENSION.  Fair control, estimated dry weight was decreased  this admission, and the patient continued on her Norvasc.   #4 - LUMBAR PAIN.  The patient began to have increased lower back pain  here this admission which we thought was secondary to  decreased mobility  and bed rest.  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  She also had mild disk narrowing at L3-4 and  L5-S1.  I have added Mobic to her regimen and we will follow as an  outpatient.   #5 - ANEMIA OF CHRONIC DISEASE.  Her hemoglobin remained stable without  any IV medications.   #6 - SECONDARY HYPERPARATHYROIDISM.  The patient continued on her oral  binders.  No vitamin D this admission.   DISCHARGE MEDICATIONS:  1. Mobic 7.5 mg daily  2. Synthroid 125 mcg daily.  3. Lasix 80 mg daily.  4. Darvocet N-100 q.6 h p.r.n. pain  5. Nephro-Vite daily.  6. Norvasc 10 mg daily at bedtime.  7. Clonidine 0.1 mg b.i.d.  8. Atenolol 100 mg b.i.d.  9. NovoLog 70/30 insulin 30 units in the a.m. 20 units in the p.m.  10.TUMS Ultram 1 gram three p.o. t.i.d. a.c.  11.Sliding scale insulin as before.    DISCHARGE INSTRUCTIONS:  The patient is instructed to continue her renal  diet as well as her diabetic diet that was instructed by the  nutritionist this admission.  She is educated regarding the importance  of this diet to avoid admission as this one.  She is educated regarding  her sliding scale insulin and how to use this, as well as monitoring her  blood sugars at home.  The patient will follow up on hemodialysis at her  usually scheduled visit.  Her estimated dry weight is 101.5 kg.      Azucena Fallen, PA      Elsberry C. Lowell Guitar, M.D.  Electronically Signed    MY/MEDQ  D:  04/14/2007  T:  04/14/2007  Job:  [id number]   cc:   [facility]  [facility]

## 2011-01-20 NOTE — H&P (Signed)
NAMECARIANA, [name]              ACCOUNT NO.:  [id number]   MEDICAL RECORD NO.:  [id number]          PATIENT TYPE:  INP   LOCATION:  2910                         FACILITY:  [facility]   PHYSICIAN:  Mindi Slicker. Lowell Guitar, M.D.  DATE OF BIRTH:  29-Nov-1965   DATE OF ADMISSION:  03/06/2007  DATE OF DISCHARGE:                              HISTORY & PHYSICAL   [name] is a 45 year old female with endstage renal disease  secondary to diabetes mellitus, who was last dialyzed on Friday and  presents today on Sunday with a complaint of rising blood sugar over the  last 24 hours, headache, nausea, and vomiting, and increased lethargy  and was found to have a blood sugar of 958 mg/dL.  Serum bicarbonate was  15, and urinary ketones were present.  The patient is now being admitted  for management of her elevated blood sugar.   PAST HISTORY:  Remarkable for diabetes, hypertension, peripheral  vascular disease, status post right toe amputation, anemia, history of  diabetic ketoacidosis, and hypothyroidism, as well as obesity.   SOCIAL HISTORY:  Currently not a cigarette smoker; she quit around the  year 2000 when she was pregnant.  Does not consume alcoholic beverages.  She is married.   FAMILY HISTORY:  Remarkable for diabetes, cancer, and high blood  pressure.   REVIEW OF SYSTEMS:  She denies any fever or dysuria, signs or symptoms  of infection.   CURRENT MEDICATIONS:  Include Lasix, multivitamins, atenolol, Synthroid,  clonidine, and Humalog, and she does not know the dosages.   PHYSICAL EXAMINATION:  VITAL SIGNS:  Blood pressure is 186/93, heart  rate is 80 and afebrile.  GENERAL:  Pleasant, obese, acutely-ill female.  HEENT:  Atraumatic and normocephalic.  Extraocular movements are intact.  LUNGS:  Clear to auscultation.  HEART:  Regular rhythm and rate.  ABDOMEN:  Soft, nontender and no hepatosplenomegaly.  EXTREMITIES:  Right foot with a ray amputation.  NEUROLOGIC:  No evidence of  focality.   LABORATORY STUDIES:  Remarkable for a sodium of 118, potassium 5.0,  chloride 92, CO2 of 18.  Blood sugar of 958.  Urinalysis revealed  negative leukocyte esterase, but 15 mg/dL of protein.  Brain CT was  unremarkable.   ASSESSMENT:  1. Hyperglycemia consistent with diabetic ketoacidosis.  2. Endstage renal disease.  Plan intravenous insulin.  3. Dialysis acutely to help manage the blood sugar.  4. Obtain correct medication dosages.      Mindi Slicker. Lowell Guitar, M.D.  Electronically Signed     ACP/MEDQ  D:  03/06/2007  T:  03/07/2007  Job:  [id number]

## 2011-01-20 NOTE — Assessment & Plan Note (Signed)
OFFICE VISIT   ETRULIA, ZARR  DOB:  October 01, 1965                                       06/13/2007  ZOXWR#:[id number]   REASON FOR VISIT:  Question infected graft.   HISTORY:  This is a 45 year old female who was sent over from [facility]  Center with a questionable infected graft.  The patient was given a dose  of antibiotics prior to completion of her dialysis run.  Her graft is a  right forearm 4 x 7 graft placed approximately 3 years ago.   PHYSICAL EXAMINATION:  She is afebrile.  The right arm on the medial  limb shows an aneurysmal portion of the graft.  There is not significant  erythema to suggest gross infection, however, infection can not be ruled  out.   ASSESSMENT:  Aneurysm of right forearm graft, question infection.   PLAN:  The patient will be set-up to go to the operating room this  Thursday.  Initially, I will plan to just replace the aneurysmal segment  of her graft.  If, however, upon exposing the graft, it appears to be  infected, or there is a significant amount of fluid around the graft,  the graft will need to be removed.  I discussed this with the patient.  If the graft is removed, she would require a catheter at that time, as  well as a graft to be placed at a later date.  Also, if the graft is  infected, this could require patch angioplasty of the artery should the  entire graft be infected.  This is going to be scheduled for this coming  Thursday.   Jorge Ny, MD  Electronically Signed   VWB/MEDQ  D:  06/13/2007  T:  06/14/2007  Job:  [id number]

## 2011-01-20 NOTE — Op Note (Signed)
[name], [name]              ACCOUNT NO.:  [id number]   MEDICAL RECORD NO.:  [id number]          PATIENT TYPE:  AMB   LOCATION:  SDS                          FACILITY:  [facility]   PHYSICIAN:  Balinda Quails, M.D.    DATE OF BIRTH:  1965/09/10   DATE OF PROCEDURE:  04/02/2008  DATE OF DISCHARGE:  04/02/2008                               OPERATIVE REPORT   SURGEON:  Balinda Quails, MD   ASSISTANT:  Wilmon Arms, PA   ANESTHETIC:  Local with MAC   PREOPERATIVE DIAGNOSES:  1. End-stage renal failure.  2. Clotted right forearm loop arteriovenous graft.   POSTOPERATIVE DIAGNOSES:  1. End-stage renal failure.  2. Clotted right forearm loop arteriovenous graft.   PROCEDURE:  Thrombectomy and revision right forearm loop arteriovenous  graft.   OPERATIVE PROCEDURE:  The patient brought to the operating room in  stable condition and placed in supine position.  Right arm prepped and  draped in sterile fashion.  Skin and subcutaneous tissue were instilled  with 1% Xylocaine.  Transverse skin incision made through the right  antecubital fossa.  Dissection carried down to expose the venous limb of  the graft.  There was an end-to-side anastomosis to the basilic vein.  The vein was severely diseased with pseudointima and stenotic.  The  venous anastomosis was taken down to the vein oversewn with running 5-0  Prolene suture.  The graft thrombectomized several times with 4 and 5  Fogarty catheters.  Excellent inflow obtained, the graft filled with  heparin saline solution, controlled with fistula clamp.   A second incision was then made more proximal in the arm over the  basilic vein.  Dissection carried down to expose an excellent quality  vein, which was good size.  The vein was ligated distally with 2-0 silk  and divided and controlled proximally with a Gregory clamp.  A new  segment of 6 mm Gore-Tex was then anastomosed to the divided venous limb  of the graft using running 6-0  Prolene suture.  This was then tunneled  to the exposed vein and anastomosed end-to-end to the basilic vein using  running 6-0 Prolene suture.  Clamps were then removed.  Excellent flow  present.  Adequate hemostasis obtained.  Sponges and instrument counts  correct.   Subcutaneous tissue closed with running 3-0 Vicryl suture in 2 layers.  Skin closed with 4-0 Monocryl.  Dermabond applied.  No apparent  complications.  The patient transferred to recovery room in stable  condition.      Balinda Quails, M.D.  Electronically Signed     PGH/MEDQ  D:  04/02/2008  T:  04/03/2008  Job:  [id number]

## 2011-01-23 NOTE — Discharge Summary (Signed)
[name], [name]              ACCOUNT NO.:  [id number]   MEDICAL RECORD NO.:  [id number]          PATIENT TYPE:  INP   LOCATION:  4709                         FACILITY:  [facility]   PHYSICIAN:  Mindi Slicker. Lowell Guitar, M.D.  DATE OF BIRTH:  April 16, 1966   DATE OF ADMISSION:  04/16/2007  DATE OF DISCHARGE:  04/19/2007                               DISCHARGE SUMMARY   ATTENDING PHYSICIAN:  Dr. Casimiro Needle   DISCHARGE DIAGNOSES:  1. Hyperglycemic hyperosmolar syndrome.  2. Diarrhea, likely viral gastroenteritis.  3. Hypertension.  4. Hyperkalemia.  5. End-stage renal disease.  6. Anemia.  7. Hyponatremia.   DISCHARGE MEDICATIONS:  1. Norvasc 10 mg p.o. daily.  2. Atenolol 100 mg p.o. b.i.d.  3. Os-Cal 500 mg one p.o. t.i.d. with meals.  4. Clonidine 0.1 mg p.o. b.i.d.  5. Aranesp 40 mcg IV every Monday with dialysis.  6. Sliding scale insulin.  7. NovoLog 70/30, 40 units a.m., 30 units p.m.  8. Synthroid 125 mcg p.o. daily.  9. Protonix 40 mg p.o. daily.  10.Zemplar 7 mcg IV every dialysis.  11.Nephro-Vite one pill daily.  12.Benadryl 25 mg p.o. b.i.d. as needed.  13.Lasix 80 mg p.o. daily.  14.Darvocet-N 100 one p.o. q.6h. p.r.n.   DISPOSITION:  The patient is continue her Monday/Wednesday/Friday  dialysis as previously.   There were no procedures performed.  There were no consultations.   BRIEF HISTORY AND PHYSICAL:   CHIEF COMPLAINT:  Headache, diarrhea.   HISTORY OF PRESENT ILLNESS:  The patient is a 45 year old Caucasian  woman with past medical history significant for diabetes mellitus,  recent hospitalization for hyperglycemia, end-stage renal disease  secondary to diabetes mellitus who goes to the kidney center on [address] for Monday/Wednesday/Friday  hemodialysis, hypertension, obesity, and hypothyroidism presenting to  the ED secondary to headache and diarrhea.  The patient reports that she  has started having several loose  stools for the last several days.  The  patient was found to have a glucose greater than 700 in the ED and is  subsequently being admitted for hyperglycemia management.  The patient  denies chest pain, cough, shortness of breath, fevers, chills, dysuria,  discharge.   REVIEW OF SYSTEMS:  Otherwise negative except for cramping of the hands  and feet secondary to hemodialysis.   For past medical history, past surgical history, allergies, home  medications, family history, and social history please see hospital  chart.   PHYSICAL EXAMINATION:  Temperature 97.1, pulse 72, respiratory rate 20,  O2 saturations 93% on room air.  GENERAL:  No acute distress.  Mild stress secondary to headache.  HEENT:  Dry oral mucosa, AT, [place], no erythema.  CARDIOVASCULAR:  S1, S2, regular rate and rhythm.  LUNGS:  CTA bilaterally, decreased breath sounds due to body habitus.  ABDOMEN:  Soft, nontender, positive bowel sounds, no guarding, no  rebound.  NEUROLOGIC:  Alert and oriented x3, cranial nerves II-XII intact, motor  5/5, cerebellar intact, DTRs normal.  EXTREMITIES:  Right lower extremity status post 1-3 digit amputation.  Mild bilateral lower extremity edema.  GENITOURINARY:  No CVA tenderness.   ADMISSION LABORATORIES:  Include a UA:  Specific gravity 1.023, pH was  7.5, glucose greater than 1000, bilirubin negative, ketones negative,  blood equals small, protein equals 100, urobilinogen equals 0.2, nitrite  negative, leukocytes negative, squamous epithelial cells few, rbc's 3-6.  I-STAT pH equals 7.317, pCO2 equals 47.6, bicarbonate equals 24.3, tCO2  equals 26 .  Wbc's 4.9, hemoglobin 11.5, platelets 205, MCV 93.0, RDW  15.5, ANC equals 3.8.  Sodium 114, potassium 5.6, chloride 85, bicarb  24.3, BUN 62, creatinine 5.0.  Chest x-ray showed no active  cardiopulmonary disease.   HOSPITAL COURSE BY PROBLEM:  #1 - HHS.  There were no signs of DKA based  on lab results.  The patient was initiated  on a Glucommander for her CBG  greater than 700.  She was eventually transitioned with Lantus and was  successfully taken off of her Glucommander.  During hospital stay the  patient underwent education for her sliding scale insulin coverage which  she had not been taking in the outpatient setting.  She also received  education on proper Lantus usage as well as diet.  The patient's blood  sugars were rapidly brought under control.  This hyperglycemia is likely  secondary to a viral gastroenteritis versus medication noncompliance.  The patient has had other hospitalizations for similar circumstances.   #2 - DIARRHEA, LIKELY SECONDARY TO VIRAL GASTROENTERITIS.  The patient  had a C. difficile which was subsequently negative.  Her diarrhea  resolved a couple of days after admission.   #3 - HYPERTENSION.  The patient's home medications were continued with  the exception of her Lasix secondary to #1 above.  She was continued on  Norvasc, atenolol and clonidine for her blood pressure management with  good control.   #4 - HYPERKALEMIA.  Secondary to #1 above, as well as her end-stage  renal disease.  The patient's hyperkalemia came under control when  insulin was given.  She also received hemodialysis during hospital stay  as well.  Potassium at time of discharge was 4.0.   #5 - END-STAGE RENAL DISEASE.  The patient was continued on her  Monday/Wednesday/Friday hemodialysis schedule.   #6 - ANEMIA.  The patient was continued on Aranesp and EPO as before.   #7 - HYPONATREMIA.  Likely secondary to #1 and is actually a  pseudohyponatremia.  Lasix likely also contributed to the patient's  hyponatremia, which had resolved at time of discharge.   DISCHARGE LABORATORIES:  Include sodium 136, potassium 4.1, chloride 99,  bicarb 26, glucose 168, BUN 27, creatinine 4.74, calcium 8.9.   DISCHARGE VITAL SIGNS:  Include temperature 98.0, pulse 63, respiratory  rate 18, blood pressure 133/69, O2  saturations 97% on room air, and a  CBG of 122.      Rufina Falco, M.D.  Electronically Signed      Mindi Slicker. Lowell Guitar, M.D.  Electronically Signed    JY/MEDQ  D:  07/01/2007  T:  07/02/2007  Job:  [id number]

## 2011-01-23 NOTE — Op Note (Signed)
NAMEARMANDA, [name]              ACCOUNT NO.:  [id number]   MEDICAL RECORD NO.:  [id number]          PATIENT TYPE:  OIB   LOCATION:  2899                         FACILITY:  [facility]   PHYSICIAN:  Vanita Panda. [name], M.D.DATE OF BIRTH:  11-Oct-1965   DATE OF PROCEDURE:  12/23/2004  DATE OF DISCHARGE:  12/23/2004                                 OPERATIVE REPORT   PREOPERATIVE DIAGNOSIS:  Right great toe infection.   POSTOPERATIVE DIAGNOSIS:  Right great toe infection.   PROCEDURE:  Amputation through MTP joint of right great toe.   SURGEON:  Vanita Panda. [name], M.D.   ANESTHESIA:  General.   COMPLICATIONS:  None.   ESTIMATED BLOOD LOSS:  Minimal.   TOURNIQUET TIME:  8 minutes.   INDICATIONS FOR PROCEDURE:  Briefly, [name] is a 45 year old long term  diabetic female who developed a wound on the plantar surface of her great  toe underneath the proximal phalanx on the plantar surface.  She had had  draining from this area.  Radiographic evidence was consistent with the  possibility of osteomyelitis.  She had difficult to heal and it was  recommended this be removed secondary to the likelihood of it being chronic  and osteomyelitic.  The risks and benefits of this were explained to her and  she agreed to proceed with surgery.   DESCRIPTION OF PROCEDURE:  After informed consent was obtained, [name]  was brought to the operating room and placed supine on the operating table.  General anesthesia was obtained and a non-sterile tourniquet was placed  around her right thigh.  Sterile drapes were then applied including prepping  with DuraPrep.  Esmarch was used to wrap up the foot and the tourniquet was  inflated to 300 mmHg.  An incision was made around the great toe just distal  to the MTP joint.  The toe was then removed and the remaining bone from the  proximal phalanx was removed at the level of the joint.  The tourniquet was  then deflated and brisk bleeding was  encountered.  The wound was then  copiously irrigated with normal saline solution and the skin was  reapproximated with a nice, cushioned pad from the plantar surface that was  reapproximated with 2-0 nylon sutures in interrupted fashion.  Well padded  sterile dressings were then applied.  The patient was awakened, extubated,  and taken to the recovery room in stable condition.  There were no  complications.     CYB/MEDQ  D:  12/23/2004  T:  12/23/2004  Job:  [id number]

## 2011-01-23 NOTE — Discharge Summary (Signed)
[place]  Patient:    MCCALL, WILL Visit Number: [id number] MRN: [id number]          Service Type: DSU Location: 3000 3011 01 Attending Physician:  Colon Branch Dictated by:   Clydene Fake, M.D. Admit Date:  05/26/2001 Discharge Date: 05/27/2001   CC:         Alfonse Alpers. Dagoberto Ligas, M.D.   Discharge Summary  DIAGNOSES:  Herniated nucleus pulposus right L4-5.  DISCHARGE DIAGNOSES:  Herniated nucleus pulposus right L4-5, uncontrolled diabetes.  PROCEDURE:  Right L4-5 semihemilaminectomy and diskectomy with microdissection with microscope.  REASON FOR ADMISSION:  Patient is a 45 year old woman who has had a two to three month history of severe right back and leg pain with some weakness in the right L5 root with decreased sensation.  MRI shows some degenerative disk disease at multiple levels but the disk herniation on the right side at L4-5. Patient brought in for diskectomy.  HOSPITAL COURSE:  Preoperative laboratories showed sugars to be uncontrolled and to have some diabetic nephropathy.  She was not really followed closely by any medical doctor.  We did get her a preoperative visit and then consult with Dr. Dagoberto Ligas for treatment of diabetes.  Meanwhile, sugars were able to get under a little better control and we proceeded with surgery.  She underwent the procedure above without complications.  Postoperative was transferred to the recovery room, then to the floor.  There she has been ambulating, has much less leg pain.  Still has back pain which is related to the incision.  There is slight drainage there but it is only very minimal.  Again, consultation done by Dr. Dagoberto Ligas was done and she has been given recommendations for insulin doses and will follow-up with him as an outpatient.  Patient will be discharged home in stable condition.  DISCHARGE MEDICATIONS: 1. Lantus insulin 40 units q.p.m. 2. Humalog 5 units subcutaneously with  each meal. 3. Vicodin ES one to two p.o. q.4-6h. p.r.n. 4. Flexeril 10 mg p.o. q.8h.  DIET:  2000 calorie ADA.  ACTIVITY:  No strenuous activity.  WOUND CARE:  Keep incision dry.  FOLLOW-UP:  With [place] in three weeks.  Follow-up also with Dr. Dagoberto Ligas. Dictated by:   Clydene Fake, M.D. Attending Physician:  Colon Branch DD:  05/27/01 TD:  05/27/01 Job: [id number]

## 2011-01-23 NOTE — Consult Note (Signed)
NAMEZENORA, KARPEL                          ACCOUNT NO.:  [id number]   MEDICAL RECORD NO.:  [id number]                   PATIENT TYPE:  REC   LOCATION:  FOOT                                 FACILITY:  [facility]   PHYSICIAN:  Jonelle Sports. Sevier, M.D.              DATE OF BIRTH:  12-29-1965   DATE OF CONSULTATION:  DATE OF DISCHARGE:                                   CONSULTATION   REFERRING PHYSICIANS:  1. Dr. Dante Gang.  2. Dr. Clare Charon.   HISTORY:  This 45 year old white female is referred at the courtesy of Drs.  Alfonse Alpers and [place] for evaluation and treatment of some lower extremity  blisters as well as a penetrating ulcerated callus on the plantar aspect of  the right hallux.  The patient has had type 2 diabetes for some 10 years and  apparently is in reasonably good control with a recent hemoglobin A1c of  6.5%.  She has also been very obese and has had difficulty with her veins in  the lower extremities and recently has been troubled by recurrent blister-  like lesions on the pretibial areas, more so on the right than on the left.  She recently was placed in a Unna boot for the right lower extremity and  this seems to have largely resolved that problem.  At that time, she was  noted also, however, to have this exophytic callus with central ulceration  on the right hallux and was placed on cephalexin 750 mg daily beginning 7  days ago.  She was referred here for our further evaluation and advise with  respect to that lesion.   PAST MEDICAL HISTORY:  1. Past medical history is notable for the fact that her diabetes is     complicated by known retinopathy and neuropathy.  2. She has had carpal tunnel syndrome.  3. She has had problems with obesity but with some 30 or 40 pounds of recent     weight loss which she says is attributed largely to diuretic therapy.  4. She is hypertensive.  5. She is hypothyroid.  6. She has some degree of renal insufficiency.   ALLERGIES:   She has no known medication allergies.   MEDICATIONS:  Her regular medications include:  1. Insulin 70/30 twice daily.  2. Clonidine.  3. Synthroid.  4. Furosemide.  5. Altace.  6. Over-the-counter iron.   EXAMINATION:  EXTREMITIES:  Examination today is limited to the distal lower  extremities.  Both legs and feet are somewhat edematous, slightly more so on  the right than on the left.  On the pretibial aspect of the right lower  extremity are several blistered areas that are covered with superficial  eschars which are easily brushed away, revealing the fact that there is  healing underneath.  There is a slight equinovarus configuration to the  right foot with dropping of the 1st metatarsal head but  on manual reduction,  this foot can be rendered fairly straight.  There are no other gross  deformities.  Skin temperatures are essentially normal and equal  bilaterally.  Pulses are everywhere palpable and appear adequate.  Monofilament testing shows that she lacks protective sensation throughout  both feet.   On the plantar aspects of the feet are minor calluses at the 1st, 3rd and  5th metatarsal head areas on the left and the 1st and 5th metatarsal head  areas on the right.   On the plantar aspect of the right hallux at approximately the  interphalangeal joint area is a huge dirty exophytic callus that is  ulcerated in its central portion.   IMPRESSION:  1. Diabetic nephropathy with ulcerated callus of right hallux.  2. Chronic venous insufficiency with superficial ulcerations now healed,     right lower extremity.   DISPOSITION:  1. The patient was given instruction regarding foot care and diabetes     mellitus by video with nurse and physician reinforcement.  2. Specifically, the patient has been in the habit of going barefoot around     home and her feet are quite dirty, as well as obviously somewhat     traumatized.  It is recommended to her that she avoid going barefoot  at     all in the future, but to keep her feet clean and to protect from further     injury.  3. The calluses underlying the 1st and 5th metatarsal heads on the right and     the 1st, 3rd and 5th metatarsal heads on the left are sharply pared     without incident.  4. Attention is turned to the major exophytic callus on the hallux and this     is progressively sharply debrided down to reveal a fairly clean open     superficial ulceration that measures some 11 x 7 mm.  5. This ulcer is treated with application of Neosporin and a tubed gauze     dressing and the patient is placed on a Darco platform walking sandal on     the right lower extremity.  6. The patient is instructed to leave the dressing in place for 2 days and     then to remove it, wash the area gently on a daily basis and treat it on     a daily basis with an application of Neosporin and a covering bandage of     her choice.  Again, she is instructed to remain in the off-loading Darco     platform sandal.  7. The plan will be, once this ulcer is somewhat further healed, to have     impressions made and to obtain custom inserts and adequate shoes for the     patient with assistance to her through the footwear fund available at     this facility.  8. A followup visit here will be in 6 days.                                               Jonelle Sports. Cheryll Cockayne, M.D.    RES/MEDQ  D:  12/06/2003  T:  12/07/2003  Job:  [id number]   cc:   Dante Gang, M.D.  Int. Med - Resident - [facility]  [place]  [place] [address]  Fax: [phone]   Clare Charon,  M.D.  [address]  [place] [address]  Fax: [phone]

## 2011-01-23 NOTE — Discharge Summary (Signed)
NAMEDRESDEN, AMENT                          ACCOUNT NO.:  [id number]   MEDICAL RECORD NO.:  [id number]                   PATIENT TYPE:  INP   LOCATION:                                       FACILITY:  [facility]   PHYSICIAN:  Eliseo Gum, M.D.                DATE OF BIRTH:  [birth year]   DATE OF ADMISSION:  06/08/2003  DATE OF DISCHARGE:  06/13/2003                                 DISCHARGE SUMMARY   ADDENDUM:   CHIEF COMPLAINT:  Sore in the left foot.   HISTORY OF PRESENT ILLNESS:  This is a 45 year old Caucasian female with a  3+ year history of diabetes mellitus, hypertension, chronic renal  insufficiency, and hypothyroidism who presented to the [facility] to Dr. Alfonse Alpers  with left foot pain and cellulitis.  Ms. Forcier reports dropping a block of  wood on her foot about two weeks ago.  She said she did not feel it at the  time but watched her foot progress from bruise, to blister, to an open sore  over the course of one week.  She went to the emergency department on  May 28, 2003, and was sent home on Keflex and pain medications.  After  taking the Keflex for 10 days, she reported a color change in her foot from  blue to red over the course of a week.  She also admits having increasing  pain that was controlled with Vicodin that had been given to her during the  ED visit.  She has been dressing her foot with bandage wraps and Neosporin  topical cream.  She notes having poor circulation in her feet and legs for  the past few years.  She denies any recent fever, nausea, vomiting, or  diarrhea.  As for her diabetes mellitus, she has been taking insulin both  a.m. and p.m. doses with average blood sugars from the 90s to 240s range  with an average of 120 to 150s.   ALLERGIES:  No known drug allergies.   PAST MEDICAL HISTORY:  1. Diabetes type 1 diagnosed three years ago.  2. Hypothyroidism.  3. Hypertension.  4. Iron-deficiency anemia.  5. Renal insufficiency secondary to  diabetes and hypertension.  6. Past history of diabetic ketoacidosis admissions, most recently in June     2003.  7. History of medical noncompliance.  8. Diabetic neuropathy, peripheral neuropathy.  9. Hyperlipidemia.  10.      She had an echo in January 2003, that showed mild mitral     regurgitation, mild tricuspid regurgitation and ejection fraction of 55     to 65%.   PAST SURGICAL HISTORY:  1. C-section in 2000.  2. Hemilaminectomy and discectomy in September of 2002.  3. Eye surgery in 2001, vitrectomy.   SOCIAL HISTORY:  Ms. Geddis is married but currently unemployed.  She lives  with her husband and has a 67-year-old son.  FAMILY HISTORY:  Ms. Lutterman has a mother who has had a history of throat  cancer and diabetes and a father with hypertension and diabetes.   REVIEW OF SYMPTOMS:  The patient notes fatigue, edema in her legs, a recent  cough, easy bruising.  She reports joint swelling and numbness in her right  hand.   PHYSICAL EXAMINATION:  GENERAL APPEARANCE:  She is pleasant, cooperative,  obese female.  VITAL SIGNS:  Pulse 89, blood pressure 187/80, temperature 97.8, respiratory  rate 20, O2 saturation 97% on room air.  HEENT:  Eyes:  Extraocular movements intact.  Pupils are equal, round,  reactive to light and accommodation.  Sclerae are clear.  ENT:  Moist mucous  membranes.  Dentition poor.  No erythema or exudate.  No lesions.  NECK:  No lymphadenopathy.  RESPIRATORY:  Bilateral clear to auscultation.  CARDIOVASCULAR:  She has a systolic murmur to the left sternal border, 2/6.  GI:  Nontender, distended, bowel sounds present.  EXTREMITIES:  She has 3+ pitting edema from knees to her feet.  She has zero  sensation below the knees.  GU:  Examination deferred.  SKIN:  She has a 1 x 1.5 eschar lesion on the left dorsal foot.  She  additionally has a 2x2 bruise on her left shin with interior bulla.  LYMPH SYSTEM:  No lymphadenopathy.  MUSCULOSKELETAL:  No  joint pain.  Upper extremity and lower extremity range  of motion intact.  Normal upper extremity strength.  NEUROLOGIC:  Cranial nerves II-XII grossly intact.   ADMISSION LABORATORY DATA:  CBC shows a white blood cell count of 7.1,  hemoglobin of 9, hematocrit 28.1, platelets 364.  BMET panel shows sodium  136, potassium 4.4, chloride 109, bicarb 21, BUN 35, creatinine 2.7, glucose  151.  Of note, she has an albumin of 2.2 and a calcium of 7.9 which  corrected to 9.34.  She had an x-ray of the foot in the emergency room on  May 28, 2003, that showed soft tissue swelling, no fractures, no  neuropathic changes, no bony lesions.   HOSPITAL COURSE:  PROBLEM #1 -  CELLULITIS:  Ms. Tout was placed on Zosyn  after a failed treatment with Keflex.  Due to her long history of diabetes  and diabetic peripheral neuropathy, it was determined that this could  potentially be osteomyelitis.  An MRI was done which just showed an abscess  on he dorsal surface of the foot.  No abnormalities to suggest  osteomyelitis.  Additionally, a surgical consult was requested.  On the  second day after admission, Ms. Schriner went to surgical debridement of her  left foot.  She was kept on her antibiotics  and upon discharge was sent out  on Augmentin 500 mg twice daily to be taken for seven days.  she was also  instructed by nursing on how to apply moist saline gauze to her left foot on  a daily basis.  She was advised to follow up with Dr. Johna Sheriff in three  weeks post discharge.   PROBLEM #2 -  DIABETES:  Ms. Caillouet reported being on insulin at home, 60  units in the a.m. and 4 units in the p.m.  On this hospital visit, she was  placed on 50 units a.m. and 40 units p.m.  During this admission, she had  several episodes of hypoglycemia, first of all on June 10, 2003, at 3 a.m.  with a blood sugar of 44, second on June 10, 2003,  at 11:40 a.m. with a blood sugar of 48 and June 11, 2003, a blood sugar  of 51.  Her insulin was  adjusted and she was placed on 45 units a.m. and 30 units p.m.  It was  difficult to control her blood sugar during this admission.  In addition to  the hypoglycemia, she had episodes of high blood sugars in the range of 200s  to 300s.  She was discharged on insulin 35 units a.m. and 35 units p.m. and  was instructed to follow up with Dr. Alfonse Alpers.  Dr. Alfonse Alpers was notified of  these problems with her blood sugar.   PROBLEM #3 -  RENAL INSUFFICIENCY:  As noted on admission, her BUN was 35  and creatinine 2.7.  These values were consistent with her last  hospitalization in June of 2004, with a BUN of 44 and a creatinine of 2.7.  These values were fairly stable during hospitalization with her discharge  BUN and creatinine of 44 and 3.2.  Due to her hyperalbuminemia, lower  extremity edema and elevated BUN and creatinine, there was concern that Ms.  Campos has a nephrotic syndrome present.  A 24-hour urine study was done  which had a 24-hour creatinine of 59, a urine protein of 354, a urine total  volume of 16.50, and a 24-hour urine of 5841.  Dr. Hyman Hopes of [facility] was notified and Ms. Shambaugh will be following up with him at the  end of this month.   PROBLEM #4 -  HYPERTENSION:  Ms. Milford was on Lasix 40 mg once a day for  her hypertension.  Her blood pressure was high on admission as noted  previously.  Additionally, her blood pressure remained high during her  hospitalization with values as high as systolic 200, diastolic 100.  She was  placed on atenolol during this hospitalization and discharged on atenolol  100.                                                 Eliseo Gum, M.D.    KC/MEDQ  D:  06/20/2003  T:  06/21/2003  Job:  [id number]

## 2011-01-23 NOTE — Discharge Summary (Signed)
[place]  Patient:    [name], [name] Visit Number: [id number] MRN: [id number]          Service Type: MED Location: CCUB 2912 01 Attending Physician:  Edwyna Perfect Dictated by:   Dante Gang, M.D. Admit Date:  01/10/2002 Discharge Date: 01/13/2002                             Discharge Summary  DATE OF BIRTH: 1966-03-12  PROBLEM LIST: 1. Diabetic ketoacidosis secondary to noncompliance. 2. Diabetes mellitus. Question of type I versus type II, poor control. 3. Anemia, iron deficiency. 4. Hypothyroidism. 5. Hypertension. 6. History of neuropathy secondary to diabetes. 7. 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 8. Chronic lower extremity cramps.  DISCHARGE MEDICATIONS: 1. Lantus insulin 60 units q.h.s. 2. Altace 10 mg p.o. q.d. 3. Lipitor 10 mg p.o. q.d. 4. Synthroid 75 mcg once daily. 5. HCTZ 25 mg once daily. 6. Iron 325 mg once daily. 7. Quinine. 8. Sliding scale insulin.  PROCEDURES: 1. Chest x-ray performed on Jan 10, 2002 showed normal chest. 2. Ultrasound of the kidneys performed on Jan 11, 2002 was negative for    any abnormalities. 3. EKG performed on Jan 11, 2002 showed normal sinus rhythm with a prolonged    QT and questionable ST and T wave changes in lateral leads.  HISTORY OF PRESENT ILLNESS: [name] is a 45 year old woman with a history of uncontrolled diabetes who presented after one month of increasing lower extremity edema, increasing blood pressure, muscle cramps, blurred vision, and polydipsia. She was seen in the Acute Care Clinic on the day before admission with a blood glucose of over 500 and a urine protein of over 300 with 20 red cells. She was sent home and told to check her blood glucose every hour and use her sliding scale insulin. She was also told to do a 24 hour urine. She says that she took 12 units of insulin altogether the day before. Her sugars were in the 300s. She stopped before bedtime  and did not recheck it on the day of admission. On the day of admission, she began to feel weak. Began having increased urine output and increasing thirst as well as blurred vision. She had her blood pressure checked at the [facility] where it was elevated and she was sent to the ED. In the ED, her glucose was 945.  PHYSICAL EXAM:  VITAL SIGNS: Temp 97.8. Blood pressure 220/120. Heart rate 110. Respiratory rate 24. Breathing 100% on room air.  GENERAL; A 45 year old woman in no acute distress.  HEENT: PERRLA. Pale conjunctiva. Tympanic membranes and oral mucosa were clear.  HEART: Tachy but regular. Her respirations were clear to auscultation bilaterally.  ABDOMEN: Soft and nontender. Positive bowel sounds.  EXTREMITIES: Showed pitting edema to the thigh with multiple scratches that she attributes to her cat.  NEURO: Exam showed some sensory deficits in the bilateral lower extremity. Otherwise intact.  The remainder of patients physical exam was unremarkable.  LABORATORY DATA: White count 6.4, hemoglobin 10.1, platelets 385, MCV 71.5. Sodium 122 and correcting to 136. Potassium 5.4. Chloride of 94, bicarb 17, BUN 52, creatinine 2.2, and glucose 945. Calcium was 8.1. AST 18, ALT 21, alkaphos 150, total bilirubin 0.9, albumin 2.1, total protein 5.7. UA showed 15 ketones with 0-2 white cells, 0-2 red cells, and otherwise normal.  HOSPITAL COURSE:  #1 - DIABETIC KETOACIDOSIS: The etiology of this was  thought to be noncompliance. She was placed in the step-down unit and started on an insulin drip. On the day following admission, her acidosis which had been mild to begin with, had resolved but her elevated blood glucose persisted with her latest CBG being 370. She was continually covered with sliding scale insulin and started back on Lantus insulin at 60 mg q.h.s. Diabetes education was ordered for her. On Jan 11, 2002, she had a grand total of 83 units of insulin from midnight  until the time when she got her Lantus before bed. The patients blood sugars were holding steady in the 100s on 60 of Lantus with only rare use for the sliding scale on Jan 12, 2002 and she was discharged home on Jan 13, 2002. She was given patient assistance forms for many of her more expensive medications and she was given some assistance in filling these out.  #2 - IRON DEFICIENCY ANEMIA: Her hemoglobin on admission was 10.1 and fell to 9.1, although this was after aggressive hydration overnight. Hemoglobin bounced back to 9.7 before discharge. She was told to continue taking iron by mouth.  #3 - CHEST PAIN/CARDIAC ENZYMES: Vague complaints of chest pain led to ordering cardiac enzymes which were initially slightly elevated with a CK of 188, MB of 9.7, troponin I of 0.07. Subsequent enzymes fell slightly with each repetition. Cardiology was consulted. The patient was seen by the physicians with [place]. She had a Cardiolite study performed which showed no ischemia or infarct. It showed an ejection fraction of 49% with mild hypokinesia of the anterior wall. They recommended placing her on an ace inhibitor and adding a beta blocker when feasible.  #3 - HYPOTHYROIDISM: The patients TSH was checked on admission and was found to be high. She had been on 50 mcg of Synthroid per day and she was increased to 75 mcg per day. This will be rechecked in six weeks in follow-up.  #4 - HYPERLIPIDEMIA: [name] had a total cholesterol of 235 with triglycerides of 258 and LDL of 136 and HDL of 47. The patient was started on a statin before discharge. Liver function tests will be followed up in the outpatient clinic.  FOLLOW-UP INSTRUCTIONS: [name] was given an appointment to see Dr. Alfonse Alpers at the [facility] on Wednesday, Jan 25, 2002 at 1:30 pm. At that time, we will see how her sugars are doing. We will also check her liver function tests. Will hold off on checking her  thyroid function again for a while, so that the Synthroid can take effect.  Dictated by:   Dante Gang, M.D. Attending Physician:  Edwyna Perfect DD:  01/26/02 TD:  01/29/02 Job: [id number]

## 2011-01-23 NOTE — Op Note (Signed)
[name], [name]              ACCOUNT NO.:  [id number]   MEDICAL RECORD NO.:  [id number]          PATIENT TYPE:  OIB   LOCATION:  2899                         FACILITY:  [facility]   PHYSICIAN:  Vanita Panda. [name], M.D.DATE OF BIRTH:  Jul 31, 1966   DATE OF PROCEDURE:  12/23/2004  DATE OF DISCHARGE:  12/23/2004                                 OPERATIVE REPORT   PREOPERATIVE DIAGNOSIS:  Right great toe deep infection (diabetic).   POSTOPERATIVE DIAGNOSIS:  Right great toe deep infection (diabetic).   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   SURGEON:  Vanita Panda. [name], M.D.   ANESTHESIA:  General.   COMPLICATIONS:  None.   ESTIMATED BLOOD LOSS:  Minimal.   TOURNIQUET TIME:  Eight minutes.   DESCRIPTION OF PROCEDURE:  After informed consent was obtained, [name]  was brought to the operating room and placed supine on the operating table.  A nonsterile tourniquet was placed around her upper thigh and the foot was  then prepped and draped with Betadine scrub and paint.  There was noted to  be a deep wound on the plantar surface of the great toe at the end of the  toe and there was purulent drainage from this.  An incision was made around  the end of the great toe and the great toe was removed through the proximal  phalanx.  A rongeur was used to cut back the infected bone to nice white  clean appearing bone. The wound was then copiously irrigated and closed with  interrupted 2-0 nylon suture.  A well-padded sterile dressing was applied  and the patient was awakened, extubated and taken to recovery room in stable  condition.      CYB/MEDQ  D:  02/03/2005  T:  02/03/2005  Job:  [id number]

## 2011-01-23 NOTE — Consult Note (Signed)
[place]  Patient:    [name], [name] Visit Number: [id number] MRN: [id number]          Service Type: MED Location: [facility] 2912 01 Attending Physician:  Edwyna Perfect Dictated by:   Guy Franco, P.A. Proc. Date: 01/11/02 Admit Date:  01/10/2002   CC:         C. Ulyess Mort, M.D.   Consultation Report  HISTORY OF PRESENT ILLNESS:  Ms. Broadnax is a 45 year old female admitted on Jan 10, 2002 with DKA.  The patient presented on Jan 10, 2002 with lethargy, confusion, and "being thirsty."  Part of her work-up included an EKG which showed lateral T-wave inversion and cardiac enzymes revealed normal CK-MBs, but mildly elevated troponin up to 0.07.  The patient denies any chest pain, palpitations, PND, orthopnea, dizziness, or syncope.  She has a long history of lower extremity edema and underwent a 2-D echocardiogram for this in January 2003 which was essentially normal showing mild MR/TR.  Her EF was normal with an ejection fraction of 55-65%.  PAST MEDICAL HISTORY:  Significant for diabetes mellitus insulin-dependent. She has had diabetes for approximately three years.  She has been recently diagnosed with hypothyroidism, hypertension, and iron deficiency anemia.  MEDICATIONS: 1. Synthroid 50 mg q.d. 2. Hydrochlorothiazide 25 mg q.d. 3. Iron 325 mg q.d. 4. Lantus 52 units q.h.s. 5. Sliding scale insulin Regular. 6. Lasix 20 mg q.d. 7. Norvasc 5 mg q.d.  ALLERGIES:  No known drug allergies.  SOCIAL HISTORY:  She is married.  Has one son.  Lives in [place].  She smokes approximately one pack of cigarettes for every two weeks.  Denies alcohol, illicit drugs, or herbal medicine use.  She tries to follow a diabetic diet.  FAMILY HISTORY:  Maternal grandfather with CAD.  Mom and dad both with diabetes.  She has one brother who is alive and well age 30, one sister with thyroid problems age 74.  REVIEW OF SYSTEMS:  As above,  otherwise negative.  PHYSICAL EXAMINATION  VITAL SIGNS:  Temperature 98, pulse 92, respirations 16, blood pressure 150/86, O2 saturation 96% on room air.  GENERAL:  No acute distress.  HEENT:  Grossly normal.  No carotid or subclavian bruits.  No JVD or thyromegaly.  CHEST:  Clear to auscultation bilaterally.  No wheezing or rhonchi.  HEART:  Regular rate and rhythm.  No gross murmur, rub, or ectopy.  ABDOMEN:  Obese.  Good bowel sounds.  Nontender, nondistended.  No masses.  No bruits.  No femoral bruits.  EXTREMITIES:  Lower extremity edema 1-2+ from the knees distally.  NEUROLOGIC:  Cranial nerves II-XII grossly intact.  LABORATORIES:  EKG reveals normal sinus rhythm, rate 88 with lateral T-wave inversion.  CBC 9.1, hematocrit 27.3.  BUN 37, creatinine 1.3.  TSH 12.938.  Urinalysis reveals ketones.  Maximum CK 288 with 9.7 MB fraction, troponin maximized at 0.07.  ASSESSMENT AND PLAN: 1. Diabetic ketoacidosis. 2. Diabetes mellitus, insulin-dependent. 3. Abnormal EKG. 4. Elevated troponins. 5. Hypothyroidism. 6. Hypertension. 7. Iron deficiency anemia.  Since this patient is diabetic with a family history of coronary artery disease, we will perform a stress Cardiolite tomorrow to assess her LV function and assess for ischemia.  We suspect that part of the troponin elevation could be secondary to dehydration.  In addition, she is hypothyroid and this could be a contributing factor to her symptoms as well as the DKA. At this point we will discontinue her Norvasc which may aid  in swelling of the lower extremities and start an ACE inhibitor to control her blood pressure and to provide kidney/renal protection.  She will also need to utilize support stockings.  At this point we will also increase her thyroid dose due to her suboptimal TSH levels.  Thank you for consult on Ms. Ruthy Dick.  We will continue to follow with you. Dictated by:   Guy Franco,  P.A. Attending Physician:  Edwyna Perfect DD:  01/11/02 TD:  01/13/02 Job: [id number]

## 2011-01-23 NOTE — Op Note (Signed)
[facility]  Patient:    LAWANDA HOLZHEIMER                     MRN: [id number] Proc. Date: 07/28/99 Adm. Date:  [id number] Attending:  Antionette Char                           Operative Report  PREOPERATIVE DIAGNOSES:       1. Intrauterine pregnancy at 31-weeks estimated                                  gestational age.                               2. Intrauterine growth restriction.                               3. Insulin-dependent diabetes mellitus with vascular                                  disease.                               4. Pregnancy-induced hypertension.                               5. Patient desires permanent sterilization.                               6. Non-reassuring fetal heart rate tracing.  POSTOPERATIVE DIAGNOSES:      1. Intrauterine pregnancy at 31-weeks estimated                                  gestational age.                               2. Intrauterine growth restriction.                               3. Insulin-dependent diabetes mellitus with vascular                                  disease.                               4. Pregnancy-induced hypertension.                               5. Patient desires permanent sterilization.                               6. Non-reassuring fetal heart rate tracing.  7. Placenta accreta.  SURGEON:                      Mark E. Dareen Piano, M.D.  Threasa HeadsBenard Halsted.  ANESTHESIA:                   Spinal.  DRAINS:                       Foley to bedside drainage.  ESTIMATED BLOOD LOSS:         900 cc.  COMPLICATIONS:                None.  SPECIMENS:                    Arterial cord pH obtained.  Placenta sent to pathology.  FINDINGS:                     Patient had normal-appearing fallopian tubes and ovaries.  The uterus had an attenuated right cornu where the placenta had embedded into the wall of the myometrium.   The myometrium appeared to be approximately 5 mm in thickness.  There was no evidence of placenta cotyledons extending through the serosa of the uterus.  DESCRIPTION OF PROCEDURE:     Patient was taken to the operating room where a spinal anesthetic was administered without complication.  She was then placed in the dorsal supine position with a left lateral tilt.  The patient was prepped with Hibiclens and a Foley catheter was placed.  She was draped in the usual fashion for this procedure.  A Pfannenstiel incision was made; this was carried down to the  fascia.  The fascia was entered in the midline and extended laterally with the ayo scissors.  The rectus muscles were then sharply dissected from the fascia superiorly and inferiorly.  Rectus muscles were divided in the midline and taken superiorly and inferiorly.  The parietal peritoneum was entered sharply and taken superiorly and inferiorly.  The bladder flap was then taken down with sharp dissection.  A low transverse uterine incision was made in the midline and extended laterally with blunt dissection.  The amniotic sac was entered with a knife; amniotic fluid appeared to be clear.  The infant was delivered in a breech presentation.  On delivery of the head, the oropharynx and nostrils were bulb-suctioned.  The cord was doubly clamped and cut and the infant handed to the awaiting NICU team.  Cord blood and cord gases were then obtained.  Attempt at removing the placenta was unsuccessful; therefore, the uterus was exteriorized nd manual removal of a placenta accreta in the right cornu of the uterus was performed.  No evidence of remaining placenta was left on examination of the uterine cavity.  The uterine incision was then closed using a single layer of 0  chromic in a running-locking fashion.  The bladder flap was not closed.  The right fallopian tube was then grasped with a Babcock, doubly ligated with 0 gut  in the isthmic portion; the knuckle was then excised.  Both ostia were visualized; hemostasis appeared adequate.  A similar procedure was performed on the opposite side.  The uterus was then placed back in the abdominal cavity.  Hemostasis was  checked and felt to be adequate.  The abdominal gutters were then irrigated. The  parietal peritoneum was not closed.  The fascia was then closed using 0 Monocryl in a running fashion, beginning at each angle and meeting in the midline. Subcuticular tissue was then made hemostatic with a Bovie.  The subcuticular tissue was then closed using interrupted 2 plain gut suture.  Stainless steel clips were then used to close the skin.  The patient tolerated the procedure well.  She was taken to recovery room in stable condition.  Instrument and lap counts were correct x 2. DD:  07/28/99 TD:  07/30/99 Job: [id number]

## 2011-01-23 NOTE — Discharge Summary (Signed)
[place]  Patient:    [name], [name]                       MRN: [id number] Adm. Date:  [id number] Disc. Date: [id number] Attending:  Levy Sjogren Dictator:   Chelsea Aus, AI CC:         Karlene Einstein, M.D.   Discharge Summary  DISCHARGE DIAGNOSES: 1. Status post below-knee amputation. 2. Diabetes mellitus type 1. 3. Migraine headaches.  DISCHARGE MEDICATIONS:  Insulin 70/30 56 units subcu q.a.m., 46 units subcu q.p.m.  FOLLOWUP: 1. Chelsea Aus, [place], on May 24, 2000, at 2 p.m. 2. Dr. Kerry Dory on June 11, 2000 at 2 p.m. 3. [facility], 2000 at 4:30 p.m.  PROCEDURES:  None.  CONSULTANTS:  None.  ADMITTING HISTORY AND PHYSICAL:  Ms. Makenize Messman is a 45 year old white female with a type 1 diabetes mellitus who presented with complaint of leg cramps, abdominal pain, frequent urination.  She said that two weeks ago she was getting low on her insulin and started cutting down her insulin dose by 2/3.  She has been doing that for the past two weeks.  PAST MEDICAL HISTORY:  Type 1 diabetes mellitus x 2-1/2 years.  MEDICATIONS:  Insulin 70/30 56 units q.a.m. 46 units q.p.m.  ALLERGIES:  No known drug allergies.  SOCIAL HISTORY:  She is married with one child.  She stopped drinking alcohol one year ago, and stopped doing tobacco one year ago.  FAMILY HISTORY:  Notable for a grandmother on maternal side with diabetes mellitus.  PHYSICAL EXAMINATION:  VITAL SIGNS:  Temperature 97, pulse 125, respirations 22, blood pressure 128/91.  GENERAL:  She was a pleasant white female in no acute distress.  HEENT:  PERRLA.  Extraocular movements intact.  Throat was clear.  Ears were clear.  NECK:  Supple with no jugular venous distension and no adenopathy.  CARDIOVASCULAR:  She was tachycardic with regular rhythm.  No murmurs, rubs, or gallops.  LUNGS:  Clear to  auscultation bilaterally.  ABDOMEN:  Soft, nontender, nondistended, positive bowel sounds.  EXTREMITIES:  Showed 1+ lower extremity edema.  NEUROLOGIC:  She was alert and oriented x 3.  Cranial nerves II-XII grossly intact.  LABORATORY DATA:  CBC with white cells of 11.4, hemoglobin 14.5, platelets 349.  Her BMP showed a sodium of 130, potassium 5.2, chloride 99, CO2 10, BUN 35, creatinine 1.4, glucose 586.  Her lipase was 35.  Her UA was negative. Her arterial blood gases showed a pH of 7.30, PCO2 of 20, PO2 of 110, and bicarb of 10.  A chest x-ray showed no active disease.  ASSESSMENT AND PLAN:  This is a 45 year old white female who presents with DKA secondary to medical noncompliance.  She has decreased her regularly scheduled insulin dose by 2/3.  For the DKA she received an IV fluid bolus of 2 L, then switched IV fluids to 200 cc per hour.  We replaced her potassium as needed. Checked her BMP q.2h. and her CBG q.1h.  Also plan to discuss counseling regarding compliance with insulin regimen for control of diabetes mellitus, as well as establishing contact with a primary care physician.  HOSPITAL COURSE: #1 - DIABETIC KETOACIDOSIS:  The patient was started on a 2 L bolus of IV fluids at the ER after which she received normal saline at 200 cc per hour and an insulin drip at 5 units per hour.  Her  9 p.m. labs that night showed a bicarb of 14 with a glucose of 384, with an anion gap of 17. Potassium was mainly replaced with K-Dur 40 mEq, and an insulin drip was decreased to 4 units an hour.  At 11 p.m. her bicarb was 21 with a glucose of 217, and an anion gap of 9.  Her IV fluids were then changed to D5 normal saline at 200 cc per hour, and her insulin drip to 4 cc per hour.  Afterwards she ate a meal, and her insulin was decreased to 3 units per hour, and at 2:20 a.m. on May 17, 2000, her bicarb was 50 with a glucose of 313 and an anion gap close to 8.  By 5:30 a.m. her  bicarb was 20 with a glucose of 262 and an anion gap of 7.  Afterwards her IV fluids were changed to normal saline at 150 cc per hour, and her insulin drip was discontinued.  She was then started on insulin 70/30 56 units q.a.m. and 46 units q.p.m.  Her noon basic metabolic panel showed a sodium of 138, potassium 4.0, chloride 109, CO2 22, BUN 18, creatinine 0.8, and glucose 207, with an anion gap of 7.  The patient was counseled regarding diabetes care, the importance of maintaining her insulin regimen at appropriate doses.  She also received diabetes outpatient education, and is [facility], 2000.  DISCHARGE LABORATORY DATA:  Her last basic metabolic panel which was drawn at 12:35 on May 17, 2000, showed sodium 138, potassium 4.0, chloride 109, CO2 22, BUN 18, creatinine 0.8, glucose 207, calcium 7.9. DD:  05/17/00 TD:  05/18/00 Job: [id number]

## 2011-01-23 NOTE — Discharge Summary (Signed)
[name], [name]              ACCOUNT NO.:  [id number]   MEDICAL RECORD NO.:  [id number]          PATIENT TYPE:  INP   LOCATION:  5532                         FACILITY:  [facility]   PHYSICIAN:  [name], M.D.DATE OF BIRTH:  09-12-1965   DATE OF ADMISSION:  03/21/2005  DATE OF DISCHARGE:  03/21/2005                                 DISCHARGE SUMMARY   ADMITTING DIAGNOSES:  1.  Profound anemia.  2.  Hypoglycemia.  3.  Uncontrolled diabetes mellitus.  4.  End-stage renal disease on chronic hemodialysis.  5.  Peripheral vascular disease, status post recent right toe amputations,      currently on antibiotics.   DISCHARGE DIAGNOSES:  1.  Anemia, improved with transfusion.  2.  Hyperglycemia.  3.  Uncontrolled diabetes mellitus.  4.  End-stage renal disease on chronic hemodialysis.  5.  Peripheral vascular disease with recent right toe amputations, currently      on antibiotics   BRIEF HISTORY:  This 45 year old white female with end-stage renal disease  secondary to diabetes mellitus on chronic dialysis Monday, Wednesday, Friday  at the [facility] is admitted for transfusion for  profound anemia. Her hemoglobin was 12.1 and dropped to 7.4 over the last  several months. It appears to be a combination of decreased EPO doses and  menorrhagia. Her stool guaiac is pending but she denies abdominal pain,  hematochezia, nonsteroidal anti-inflammatory drug use.   LABORATORY DATA ON ADMISSION:  Hemoglobin 7.4, glucose 705, potassium 4.4,  BUN 28, creatinine 4.7, CO2 26.   HOSPITAL COURSE:  The patient was transfused two units packed red blood  cells. She received sliding scale insulin. Her 70/30 insulin was increased  to 45 units in the morning and 35 units in the evening. She received  Augmentin 500 mg while in the hospital. Post-transfusion laboratory studies  were not checked. At the time of discharge, her blood sugar had come down to  470. She was  instructed to continue sliding scale insulin home and was  informed of her increased doses of 70/30 insulin.   DISCHARGE MEDICATIONS:  1.  70/30 insulin 45 units in the morning, 35 units in the evening.  2.  Norvasc 10 mg daily.  3.  Atenolol 100 mg q.p.m.  4.  Nephro-Vite vitamin one daily.  5.  Synthroid 50 mcg p.o. daily.   INSTRUCTIONS:  Dialysis on Monday, Wednesday, Friday at the [facility], Accu-Cheks to be done each treatment, follow up as an  outpatient.      Zenovia Jordan, P.A.      [name], M.D.  Electronically Signed    RRK/MEDQ  D:  06/07/2005  T:  06/07/2005  Job:  [id number]

## 2011-01-23 NOTE — Consult Note (Signed)
[name], SIEGRIST              ACCOUNT NO.:  [id number]   MEDICAL RECORD NO.:  [id number]          PATIENT TYPE:  INP   LOCATION:  5703                         FACILITY:  [facility]   PHYSICIAN:  James L. Deterding, M.D.DATE OF BIRTH:  [birth year]   DATE OF CONSULTATION:  07/04/2004  DATE OF DISCHARGE:                                   CONSULTATION   REASON FOR CONSULTATION:  Chronic renal failure.   HISTORY OF PRESENT ILLNESS:  This is a 45 year old female with diabetes,  over 20 year history, and a long term history of hypertension, with  recurrent lower extremity cellulitis and ulcers, history of anemia,  hypoparathyroidism, hyperlipidemia, baseline creatinine 4.6 on May 02, 2003.  She has had a slowly worsening creatinine.  She had a right arm AVG  placed approximately two months ago.  She is admitted with leg cramps,  swelling of the lower extremities.  The cramps are mostly at night, some  during the day.  She has had a good appetite but a bad taste in her mouth.  She has had no itching or shortness of breath.  Her energy is not real good,  but she walks 1-2 miles a day.  She has had a cough with eating.  She admits  to depression.  She also admits to variable medications adherence and that  is primarily due to social issues.   ALLERGIES:  No known drug allergies.   PAST MEDICAL HISTORY:  Chronic renal insufficiency, hypertension, diabetes,  hypothyroidism, hyperlipidemia, obesity, normocytic anemia with iron  dependence, she has varied about getting it because of adherence issues,  lower extremity cellulitis, nonadherence to the medications.  She has had a  C-section.  She has had a hemilaminectomy and discectomy.  She has had eye  surgery.   MEDICATIONS AT HOME:  We have a variable list here, the list from the office  shows insulin 70/30, 40 in the morning, 30 in the p.m., Furosemide 60 mg  b.i.d., Atenolol 100 mg daily, Clonidine 0.1  b.i.d., Synthroid 0.75 mcg  daily, Caduet 5/20 once daily, Actos 15 mg daily, ferrous sulfate 325 daily.  The Actos, the Caduet, the ferrous sulfate, are not on the list she brought  with her as well as Synthroid is not on that list, either.   REVIEW OF SYMPTOMS:  HEENT:  She has blurred vision at times, she can read.  She has had headaches intermittently.  She has one now.  No sore throat, dry  eyes, dry mouth.  PULMONARY:  No cough, no sputum production, never been a  smoker.  GI:  She has difficulty eating, she gets coughing with eating.  No  constipation or diarrhea.  No history of hepatitis or jaundice.  SKIN:  Unremarkable.  MUSCULOSKELETAL:  She has had back as mentioned above, does  not bother her at the current time.  NEUROLOGICAL:  She gets numbness and  tingling in her feet at times.   SOCIAL HISTORY:  She lives with her husband, has a 18-year-old child.  She  used to work for [employer].   FAMILY HISTORY:  Mother died of throat cancer at 14.  She has one brother  and one sister who are healthy.  Father died at age 49.   PHYSICAL EXAMINATION:  GENERAL:  She is in no acute distress, obese, very cooperative.  VITAL SIGNS:  Blood pressure 156/88 supine, going to 154/84 standing,  temperature 97.5.  HEENT:  Diabetic retinopathy with hemorrhage and exudates, laser therapy,  disks sharp.  NECK:  No masses.  LUNGS:  Decreased breath sounds, no rales, rhonchi, or wheezes, normal  percussion, normal expansion.  HEART:  Regular rate and rhythm, no murmur, there is an S4, PMI is loudest  at the fifth intercostal space, 2+ edema, dorsalis pedal pulses are 1+/4+.  No thrills.  ABDOMEN:  Positive bowel sounds, liver down 2 cm, bruises about the abdomen.  EXTREMITIES:  She has an AVG in her right forearm with a bruit.  MUSCULOSKELETAL:  No abnormalities.  NEUROLOGICAL:  Cranial nerves 2-12 grossly intact.  Motor 5/5 and symmetric.  Reflexes are 2+/4+ in the upper extremities, 1+ at the patella, trace at  the  Achilles.   LABORATORY DATA:  Sodium 139, potassium 3.9, chloride 111, bicarb 16,  creatinine 4.5, BUN 69, glucose 63.  Urinalysis 300 mg percent protein.  Hemoglobin 10.9, white count 7.7, 214,000.  TSH 16.7.   ASSESSMENT:  1.  Chronic renal failure, no change in her creatinine, symptoms are      consistent with uremia, but I am not sure it is all uremia, I think it      is probably multifactorial, i.e., depression, hypothyroidism,      neuropathy, question of statin side-effect.  I do not feel dialysis is      indicated acutely but she may need a trial of that in the near future.  2.  Anemia, check her iron and TIBC, give her iron EPO.  3.  Hyperparathyroidism, will check PTH.  4.  Hyperlipidemia, question statin side-effects.  5.  Diabetes mellitus, use tight control.  6.  Obesity, need to lose weight.  7.  Depression, significant issue, may need SSRI.  8.  Hypothyroidism, may need to increase her Synthroid.   PLAN:  1.  Check her CT.  2.  Check iron.  3.  Check PTH.  4.  Diuresis.       JLD/MEDQ  D:  07/04/2004  T:  07/04/2004  Job:  [id number]

## 2011-01-23 NOTE — Discharge Summary (Signed)
[name], [name]                          ACCOUNT NO.:  [id number]   MEDICAL RECORD NO.:  [id number]                   PATIENT TYPE:  INP   LOCATION:  5036                                 FACILITY:  [facility]   PHYSICIAN:  Madaline Guthrie, M.D.                 DATE OF BIRTH:  [birth year]   DATE OF ADMISSION:  12/21/2003  DATE OF DISCHARGE:  12/26/2003                                 DISCHARGE SUMMARY   DISCHARGE DIAGNOSES:  1. Cellulitis with methicillin-sensitive Staphylococcus aureus in right     lower extremity.  2. Diabetes.  3. Hypothyroidism.  4. Hypertension.  5. Chronic renal insufficiency.  6. Chronic anemia.  7. Medical noncompliance.  8. Obesity.  9. History of diskectomy.  10.      Hyperlipidemia.  11.      Vitrectomy in 2001.  12.      Echo in January of 2003 with an ejection fraction estimated 55 to     65%.   DISCHARGE MEDICATIONS:  1. Levaquin 750 mg one every other day x10 days.  2. Insulin 70/30 40 units q.a.m., 30 units q.p.m.  3. Lasix 60 mg b.i.d.  4. Atenolol 100 mg daily.  5. Clonidine 0.1 mg b.i.d.  6. Multivitamins including iron daily.   REFERRING PHYSICIAN:  Outpatient Clinic where the patient sees Dante Gang, M.D.   CONSULTATIONS:  None.   PROCEDURE:  None.   HISTORY OF PRESENT ILLNESS:  The patient is a 45 year old white female with  a history of diabetes, hypertension, and lower extremity cellulitis with  last hospitalization one month ago for the latter.  The patient noticed a  lesion on her right lower extremity that had been healing from a prior  episode of cellulitis which began to become painful and red five days prior  to being seen on March 15.  The patient was evaluated nine days before  admission in outpatient clinic just for followup at which time the patient  states that she had no signs of infection.  The patient was told to use  Neosporin and keep the area clean and dry which the patient states she did.  She denies new  trauma to the area.  She denies nausea and vomiting, fevers,  and new swelling to the lower extremity; in fact, the patient had noticed  decreased swelling since beginning Lasix one month ago.  The patient does  experience pain when things touch the area of cellulitis and when she walks.  When the pain is worse, it is 9 out of 10.   ALLERGIES:  No known drug allergies.   PAST MEDICAL HISTORY:  As per the discharge diagnoses.   MEDICATIONS:  1. Insulin 70/30 36 units q.a.m. and 26 units q.p.m.  2. Lasix 60 mg b.i.d.  3. Atenolol 100 mg daily.  4. Lisinopril 20 mg daily  5. Clonidine 0.1 mg b.i.d.  6. Occasional iron pills.  The patient denies current smoking, though, she did have occasional use up  until five years ago.  The patient denies alcohol and drug use.  She is  married and has a 32-year-old son at home.  She pays out of the pocket for  her prescription and health care bills.   FAMILY HISTORY:  Mother is alive at age 45 with throat cancer.  Father is  alive at age 45 with hypertension.  The paternal grandmother has diabetes.  The patient has one brother and one sister who are healthy.  Her 94-year-old  child is also healthy.   REVIEW OF SYSTEMS:  The patient states that she has lost 28 pounds with  Lasix which she began one month ago.  Other positive symptoms not noted in  the HPI include occasional coughing while eating, diarrhea, some anorexia  for the past five days, some cold intolerance, and some headache especially  at night and in the early morning.   PHYSICAL EXAMINATION:  VITAL SIGNS:  Temperature 97.8, pulse 72, blood  pressure 203/109, respirations 18, O2 saturation 99% on room air.  GENERAL:  She is in no acute distress.  She is alert, oriented, and  pleasant.  HEENT:  Eyes are anicteric.  NECK:  No lymphadenopathy and the thyroid appears to be within normal  limits.  LUNGS:  Benign.  HEART:  Regular rate and rhythm with no murmurs, rubs, or gallops.   ABDOMEN:  Belly is nontender and nondistended.  There are normal bowel  sounds.  EXTREMITIES:  The skin on bilateral feet is dusky.  There is 3+ bilateral  edema on the lower extremities with pitting.  Old wounds are noted on the  dorsum of the left foot and the pretibial and calf areas of the left leg.  On the right leg, there is a warm and erythematous area of skin surrounding  a lesion which is approximately 4 x 2 cm, longest in the transverse  direction.  This ulceration is pretibial and displays some crusted pus and  some dried blood.  The feet are warm and display 2+ dorsalis pedis pulses.  LYMPH:  There is no lymphadenopathy.  MUSCULOSKELETAL:  Normal gait, although, somewhat antalgic on the right.  PSYCHIATRIC:  Appropriate and the patient is engaging.  GENITOURINARY:  Deferred.   LABORATORY DATA:  Venous blood shows pH 7.22, CO2 39.9, bicarb 16.3.  Chemistries; sodium 131, potassium 5.2, chloride 105, CO2 16.3, BUN 70,  creatinine 3.5, and glucose 571.  CBC; white blood cell count 6.1,  hemoglobin 10.1, hematocrit 31.2, and platelets 302.  MCV 75.7, ANC 4.2,  neutrophil percent is 69.  The patient displays an anion gap of 10.   HOSPITAL COURSE:  Problem 1.  Cellulitis.  The patient has an extensive  history of cellulitis, most recently having been hospitalized one month ago.  On past hospitalizations, the patient has been given Augmentin and Zosyn in  October of 2004 and most recently Keflex.  However, we are seeing recurrence  of infection.  The wound was cultured and returned methicillin-sensitive  Staphylococcus aureus.  In the ED the patient was given 1 gram of Ancef.  It  was felt that Zosyn should be used while the patient was an inpatient.  This  was started in the ED on the day of admission and continued until March 18,  at which time the patient was put on oral Augmentin therapy.  The patient did clinically improve over the course of admission and on date  of discharge   does not display significant erythema surrounding the ulcer site.  The ulcer  site is clean now and displays healthy pink granulation tissue, with no  apparent pus or continued infection.  The patient has not had a fever over  the course of admission and displayed no other signs of infection other than  the localized symptoms in her leg.   Problem 2.  Rule out DVT.  The patient did later describe some pain in her  leg on examination.  This pain was in the right leg and elicited when the  calf was squeezed.  The patient subsequently had a D-dimer drawn which was  positive at a level of 1.3.  A Doppler was performed on bilateral lower  extremities on March 18 which showed no evidence of DVT or superficial  thrombosis.  The patient never showed any symptoms of shortness of breath  during course of admission and calf tenderness improved on clinical  examination.   Problem 3.  Diabetes.  As previously noted, the patient had a glucose of 571  on admission to the ED.  Though she displayed a non-gap acidosis, it was  felt that her noncompliance with insulin and having an infection may have  made her susceptible diabetic ketoacidosis.  Therefore an urinalysis was  performed and ketones were noted in the urine.  Based on a suspicion of DKA  on admission, the patient was started on an insulin drip per the  recommendations of the [employer].  She was also started on fluids normal  saline run at 150 mL per hour.  Potassium levels did drop once insulin and  fluids were administered with lowest level at 3.5.  Repletion was performed  and glucose levels did come down.  Basic metabolic panels were specifically  looked at for a decreasing level in the potassium following the initiation  of administration of normal and insulin and it was never seen to dip below  3.5.  On date of discharge, sugars are still moderately high.  Last measured  glucose was in the 200 range.  Therefore on date of discharge,  regimen of  70/30 has been changed to the discharge recommended dose which is 40 units  in the morning and 30 units at night.  It was noted earlier in the admission  that glucose levels were controlled within the 100's with NPH at 20 units  b.i.d. and sliding scale insulin, however, the patient has history of  medical noncompliance and has difficulty paying for expensive medications as  she pays out of pocket, so NPH and other medications such as the sliding  scale insulin and Lantus would probably not be advised in this patient.  Therefore, on the discharge dose of 70/30 will hopefully work well with the  patient's financial and lifestyle status.   Problem 4.  Hypertension.  On the day of admission, blood pressure again was  at 203/109.  Headache in review of systems might have been secondary to this  blood pressure which she may have had for unknown period of time and it is suspected that the patient was noncompliant with her medications.  Upon  starting the medications in the stepdown unit, the blood pressure did come  down to the 140's to 150's over 70's.  At no other time over admission did  blood pressure become a concern and on the day of discharge blood pressure  was noted to be 196/98.  However, while the patient has been in our care,  she has only been on clonidine and Lasix to control her blood pressure.  The  atenolol had not been started.  Therefore, it is felt that her continued  maintenance of clonidine therapy with addition of atenolol should control  her blood pressure suitably.   Problem 5.  Hypothyroidism.  An elevated TSH was noted during admission at  12.56, however, T4 was at 5.9 which is within normal range.  The patient  does have a prescription at home for Levothyroxine and may take it when it  becomes possible for her.  During admission this diagnosis was not  considered to be problematic.   Problem 6.  Chronic renal insufficiency.  Elevated BUN and creatinine  were  not much different than previous values noted on prior admissions.  We doubt  an acute process.  After the hydration, the patient's BUN and creatinine did  not change greatly.  Although, did drop from 70 to 44 over the course of  three days.   Problem 7.  Low hemoglobin and hematocrit.  The patient stated on admission  that she is not taking her iron very often.  There was minimal change from  prior values in her hemoglobin and hematocrit.  Based on the infection, we  did not treat her with iron therapy as we felt that this could possibly  aggravate the infection in her leg.   CONDITION ON DISCHARGE:  Stable.   DISPOSITION:  Discharged to home.   DISCHARGE MEDICATIONS:  As above.   DISCHARGE INSTRUCTIONS:  The patient is to keep the area on her right leg  clean and dry.  She is to wash with gentle soaps and to not scrub very hard  on the ulceration.  It is also recommended to her to follow a low salt diet,  to try to limit the intake of fats.   FOLLOW UP:  An appointment will be made for the patient in the outpatient  clinic at which time hopefully she may see Dr. Alfonse Alpers.  When she is seen in  followup, the cellulitic area on her right leg should be evaluated for  continued infection.  It may be advisable at that time also to check glucose  levels and TSH and T4.  Iron therapy may again be recommended to the patient  at that time.  The patient may call or return to emergency department if  symptoms do worsen.      Joesph Fillers, M.D.                        Madaline Guthrie, M.D.    Hadley Pen  D:  12/26/2003  T:  12/28/2003  Job:  [id number]

## 2011-01-23 NOTE — Op Note (Signed)
[name], SPOERL                          ACCOUNT NO.:  [id number]   MEDICAL RECORD NO.:  [id number]                   PATIENT TYPE:  OIB   LOCATION:  2866                                 FACILITY:  [facility]   PHYSICIAN:  Janetta Hora. Fields, MD               DATE OF BIRTH:  06/05/1966   DATE OF PROCEDURE:  03/31/2004  DATE OF DISCHARGE:                                 OPERATIVE REPORT   PREOPERATIVE DIAGNOSIS:  Ischemic steal, left hand, secondary to  arteriovenous graft.   POSTOPERATIVE DIAGNOSIS:  Ischemic steal, left hand, secondary to  arteriovenous graft.   PROCEDURE:  Ligation of left upper arm arteriovenous graft.   ANESTHESIA:  Local with IV sedation.   ASSISTANT:  Nurse.   OPERATIVE FINDINGS:  Patent left upper arm AV graft, ligated.   OPERATIVE DETAILS:  After obtaining informed consent, the patient was taken  to the operating room.  The patient was placed in the supine position on the  operating room table.  After adequate sedation, the patient's left upper  extremity was prepped and draped in the usual sterile fashion.  Local  anesthesia was infiltrated at a pre-existing incision near the antecubital  crease.  Incision was reopened and carried down through the subcutaneous  tissues.  The arterial limb of the graft was dissected free just adjacent to  the arterial anastomosis and doubly ligated with a #1 silk tie.  The graft  had no flow at this point.  The patient had a biphasic radial signal, had a  monophasic ulnar signal after ligation of the graft.  Next the wound was  thoroughly irrigated with normal saline solution.  The deep tissues were  closed with a running 3-0 Vicryl suture.  The skin was then closed with  interrupted 4-0 nylon vertical mattress sutures.  The patient tolerated the  procedure well, and there were no complications.  Instrument, sponge, and  needle count were correct at the end of the case.  The patient was taken to  the recovery room in  stable condition.                                               Janetta Hora. Fields, MD    CEF/MEDQ  D:  03/31/2004  T:  03/31/2004  Job:  [id number]

## 2011-01-23 NOTE — Discharge Summary (Signed)
NAMEJENE, HUQ                          ACCOUNT NO.:  [id number]   MEDICAL RECORD NO.:  [id number]                   PATIENT TYPE:  INP   LOCATION:  5727                                 FACILITY:  [facility]   PHYSICIAN:  Eliseo Gum, M.D.                DATE OF BIRTH:  13-Nov-1965   DATE OF ADMISSION:  06/08/2003  DATE OF DISCHARGE:  06/13/2003                                 DISCHARGE SUMMARY   ADDENDUM:  Addendum to a discharge summary started on 06/17/03 and continued  today, 06/20/03 as previously discussed.   1. Hypertension.  It was thought that a calcium channel blocker would be     better than atenolol for her blood pressure, but due to her inability to     pay for her medications atenolol seemed to be the first choice and there     was concern that the atenolol would mask signs of hyperglycemia.  Dr.     Alfonse Alpers was notified of this and at his discretion was switched to a     calcium channel blocker.  2. History of anemia.  Ms. Thomann has a past diagnosis of iron deficiency     anemia.  Her hemoglobin and hematocrit were 9.0 and 28, respectively.     These values were comparable to her hemoglobin of 8.0 and hematocrit of     25.2 on her last admission in June 2004.  These values remained stable     during her admission.  Her discharge was 8.8, hematocrit 27.0.  3. Hypothyroidism.  Ms. Zern was on Synthroid at home, 100 mcg daily,     thyroid studies were done during this hospitalization which showed a TSH     of 6.39.  Due to these results, Ms. Vitrano was switched to Synthroid 125     mcg for the remainder of her hospitalization.  She was discharged on the     same dose.   DISCHARGE LABORATORY DATA:  White blood cell count 7.7, hemoglobin 8.8,  hematocrit 27, platelets 361,000.  BMET as follows:  Sodium 140, potassium  4.6, chloride 111, bicarb 22, BUN 44, creatinine 3.2, glucose 131.   DISCHARGE INSTRUCTIONS:  As mentioned before, Ms. Satre has a long  history  of noncompliance.  She has multiple visits planned for followup, including:  1. Outpatient clinic with Dr. Alfonse Alpers that she did not attend.  2. An appointment with Dr. Hyman Hopes with [facility] on Thursday,     July 05, 2003 at 2:30 p.m.  3. She is supposed to follow up with Dr. Johna Sheriff in three weeks and call     for an appointment.  There is concern that she might not complete these     requirements.  Additionally, she was supposed to apply for a medical     assistance program at the [facility].      Corwin Levins  Eliseo Gum, M.D.    WS/MEDQ  D:  06/20/2003  T:  06/21/2003  Job:  [id number]

## 2011-01-23 NOTE — Discharge Summary (Signed)
[name], [name]              ACCOUNT NO.:  [id number]   MEDICAL RECORD NO.:  [id number]          PATIENT TYPE:  INP   LOCATION:  5703                         FACILITY:  [facility]   PHYSICIAN:  Eliseo Gum, M.D.   DATE OF BIRTH:  1966-01-30   DATE OF ADMISSION:  07/03/2004  DATE OF DISCHARGE:  07/06/2004                                 DISCHARGE SUMMARY   DISCHARGE DIAGNOSIS:  Chronic renal insufficiency.   PAST MEDICAL HISTORY:  1.  Diabetes mellitus, type 2 with retinopathy, peripheral neuropathy,      nephropathy diagnosed 2001.  2.  Hypothyroidism.  3.  Hyperlipidemia.  4.  Hypertension.  5.  Obesity.  6.  Anemia.  7.  Current leg cellulitis.  8.  Medical noncompliance.   PAST SURGICAL HISTORY:  1.  C-section, 2000.  2.  Hemilaminectomy/diskectomy, 2002.  3.  Eye surgery, 2001, vitrectomy.   DISCHARGE MEDICATIONS:  1.  Furosemide 80 mg b.i.d.  2.  Iron sulfate 325 mg b.i.d.  3.  Atenolol 100 mg daily.  4.  Insulin 70/30, 40 units q.a.m. and 30 units q.p.m.  5.  Synthroid 150 mcg daily.  6.  Clonidine 0.1 mg b.i.d.  7.  __________ 0.5 mcg daily.  8.  Fluoxetine  20 mg q.a.m.  9.  Bicarbonate 650 mg b.i.d.  10. Aranesp injections at [facility] office q. week.  11. Tylenol 325 mg p.r.n. q.4h.   FOLLOW UP:  The patient will call [facility] for  appointment.  She will also be called by [place] for an appointment in the  outpatient clinic.   PROCEDURES:  1.  Bilateral lower extremity venous Doppler on July 04, 2004 showed no      evidence of DVT, thrombus or Baker's cyst.  2.  Plain film of right foot showed great toe soft tissue loss without      evidence of osteomyelitis.   CONSULTATIONS:  Renal, [facility].   HISTORY OF PRESENT ILLNESS:  [name] is a 45 year old white female with  diabetes mellitus, hypertension, and chronic renal insufficiency with chief  complaint of leg pain and perhaps swelling for one  and a half weeks to 2  weeks.  She also complained of weakness, nausea and vomiting for about a  week which has worsened over the past 3 days.  She also complained of  tingling and generalized muscle cramps in both of her upper extremities and  lower extremities for about 4 days.  The patient complained of a metallic  taste in her mouth as well, no dizziness or confusion but is complaining of  a headache with no relief from Tylenol.  No fevers, chills, weight loss,  diarrhea, constipation, chest pain, shortness of breath or palpitations.  No  vision changes.  The patient sees Dr. Hyman Hopes at [facility] and had a  graft placed in August 2005.  The patient was last seen by Dr. Hyman Hopes  approximately 2 months prior to admission.  She stated she missed  appointments because she has been having transportation problems at this  time.  She was last  seen in the outpatient clinic about June or July of 2005  and at that time she had cellulitis and ulcers which have since healed.  The  patient states that she has been compliant with all of her medications.   ALLERGIES:  NO KNOWN DRUG ALLERGIES.   SOCIAL HISTORY:  The patient used to smoke a pack of cigarettes a week for  approximately 3 years but she quit 6 years ago.  She states she stopped  drinking alcohol about 8 months ago.  She denies ever using IV drugs or  cocaine.  She is married.  She used to work in [employer], last worked in about 2002.  She has insurance and lives with her  husband.  She is working with disability for her diabetes and chronic renal  insufficiency.   FAMILY HISTORY:  Her mother is age 52 with throat cancer, father is age 28  with hypertension and emphysema.  She has one brother who is healthy and one  sister who is healthy.  She has a 72-year-old son.   REVIEW OF SYSTEMS:  As per HPI.  She cannot remember when she had her last  flu shot.   PHYSICAL EXAMINATION:  VITAL SIGNS:  Pulse 76, blood  pressure 192/84,  temperature 96.7, respirations 22, O2 saturation 99% on room air.  GENERAL:  Obese, pleasant, white female in no acute distress.  HEENT:  Pupils equal round and reactive to light, extraocular movements are  intact, sclerae white, conjunctivae pink, wears glasses.  Oropharynx clear,  several upper dentition absent.  NECK:  Supple, no cervical lymphadenopathy, no carotid bruits.  LUNGS:  Clear to auscultation bilaterally, good air movement.  CARDIOVASCULAR:  Regular rate and rhythm, without murmurs, rubs or gallops.  ABDOMEN:  Soft, obese, positive bowel sounds, nontender, non-distended,  positive hepatosplenomegaly, good thrill/bruit over the right forearm AV  graft.  EXTREMITIES:  2-4+ pitting edema bilaterally, right greater than left up to  the knee, venostasis changes noted, no warmth but some erythema on the right  leg.  Stage IV first toe ulcer on right foot, no active drainage, dry with  eschar and granulation tissue.  She also had a well-healed scar on the  dorsum of her left foot and palpable cords, negative Homan's sign.  She has  onychomycosis of the left greater than right big toe.  Muscle strength is  5/5 in lower extremities bilaterally.   ADMISSION LABS:  Sodium 140, potassium 3.8, chloride 112, bicarb 19, BUN 70,  creatinine 4.6 - up from 57 and 3.6, respectively in May, glucose 72, anion  gap 9.  White count 8.8, hemoglobin 11.7 - up from 8.9 in May 2005,  platelets 319,000, MCV 82.8.  PT 12.8 with INR of 1, PTT is 32, D-dimer  0.83.  UA shows 100 glucose, greater than 300 protein, moderate blood,  correlating to 0-2 red blood cells and no leukocyte or nitrites.   HOSPITAL COURSE:  Problem 1. The patient was admitted for chronic renal  insufficiency and acute on chronic elevation in creatinine.  It was thought  that some of her symptoms were secondary to uremia given the metallic taste in her mouth and diffuse myalgias and nausea and vomiting and  decreased  appetite.  We called renal for further evaluation and the patient already  had a right forearm AV graft in place which had a palpable thrill.  It is  likely that it was ready for use if the patient needed dialysis.  Her  electrolytes were okay on the day of admission so we waited until the next  day before calling renal.  They saw her and decided that they would see if  she would respond to tight diabetes and hypertension control and general  care.  Her creatinine remained about 4.2 throughout the hospital stay.  Also, her TSH was elevated at 16.7.  She had better control of her risk  factors, including her hypertension and diabetes in house.  Because of her  hypothyroidism, her Synthroid was increased.  We told her to continue taking  her medications at home and keep her legs elevated for lower extremity  edema.  She did have an area on her toe which was concerning and given the  fact that she had an ulcer and we obtained plain films and they showed no  evidence of osteomyelitis.  She was started on Augmentin and will continue  it 10 days post discharge.  The patient was discharged on July 06, 2004.   DISCHARGE LABORATORY DATA:  White count 6, hemoglobin 12.2, platelets  293,000.  Sodium 137, potassium 4.1, chloride 110, bicarb 22, BUN 58,  creatinine 4.2, glucose 123.  Phosphate 5.4.  Hemoglobin A1c 7.1.  CK 206  and 234, respectively.  Triglycerides 157, LDL 90, HDL 40.  TSH 16.793, PTH  328.2.  Iron 24%, iron saturation 9.       KC/MEDQ  D:  09/15/2004  T:  09/15/2004  Job:  [id number]

## 2011-01-23 NOTE — Discharge Summary (Signed)
[name], [name]              ACCOUNT NO.:  [id number]   MEDICAL RECORD NO.:  [id number]          PATIENT TYPE:  INP   LOCATION:  3024                         FACILITY:  [facility]   PHYSICIAN:  Duncan Dull, M.D.     DATE OF BIRTH:  Oct 06, 1965   DATE OF ADMISSION:  09/10/2004  DATE OF DISCHARGE:  09/13/2004                                 DISCHARGE SUMMARY   DISCHARGE DIAGNOSES:  1.  Hypoglycemia.  2.  Foot ulcer.  3.  Hypothyroidism.  4.  Anemia.  5.  Chronic renal insufficiency.   PAST MEDICAL HISTORY:  1.  Type 2 diabetes mellitus with peripheral neuropathy, retinopathy, and      nephropathy.  2.  Hypertension.  3.  Chronic renal failure, being followed by Dr. Hyman Hopes.  4.  Hypothyroidism.  5.  Hyperlipidemia.  6.  Obesity.  7.  Normocytic anemia.  8.  Lower extremity cellulitis.  9.  History of nonadherence.  10. Status post vitrectomy.  11. History of diskectomy.   DISCHARGE MEDICATIONS:  1.  70/30 insulin, 20 units with breakfast.  2.  Clonidine 0.1 mg b.i.d.  3.  Atenolol 100 mg daily.  4.  Prozac 40 mg daily.  5.  Lasix 80 mg b.i.d.  6.  Synthroid 25 mcg daily.   The patient was to follow up with her podiatrist as well as the internal  medicine outpatient clinic on September 18, 2004 at 2:30 p.m.  She was given  page number for the house officer if she were to have any other symptoms of  hypoglycemia or other problems.   CONSULTANTS:  None.   PROCEDURES:  The patient had an MRI of the right lower extremity to evaluate  for possible signs of osteomyelitis or septic arthritis.  The MRI revealed  soft tissue swelling and edema, with no focal abscesses or focal findings to  suggest osteomyelitis or septic arthritis.   HISTORY OF PRESENT ILLNESS:  [name] is a 45 year old white female with  history of type 2 diabetes mellitus with poor control who presented to the  hospital on September 10, 2004 with three to four days of weakness.  On the  morning of admission,  she was found groggy and shaking and was brought to  the emergency room at that time.  CBG was significant for a glucose level of  42.  On further discussion, it was revealed that the patient had a right toe  wound for approximately one month that had been getting worse lately with  increasing pain.   PHYSICAL EXAMINATION:  VITAL SIGNS:  Temperature 93.6; pulse 68; blood  pressure 208/92; respiratory rate 18; O2 saturation 100% on room air.  GENERAL:  She was alert and oriented x 4, in no acute distress.  HEENT:  Eyes were anicteric, equal, reactive to light.  Extraocular  movements intact.  ENT was without any significant findings.  RESPIRATORY:  Clear.  CARDIOVASCULAR:  Regular rate and rhythm, with no murmurs, rubs, or gallops.  ABDOMEN:  Benign.  EXTREMITIES:  The patient has a right arm graft.  2+ pedal edema bilaterally  in the lower extremities.  NEUROLOGIC:  Normal.  PSYCHIATRIC:  Appropriate.   LABORATORIES AT THE TIME OF ADMISSION:  Sodium 136, potassium 4.1, chloride  109, CO2 23, BUN 54, creatinine 4.1, glucose 236.  Lactic acid level 2.3.   HOSPITAL COURSE:  [name] was admitted to a regular bed.  Blood cultures  were obtained x 2 to evaluate for any signs of bacteremia or sepsis.  These  were negative.  TSH was found to be markedly elevated at 23.016.  MRI was  obtained which was without any signs of abscess or osteomyelitis.  The  patient's Synthroid dose was increased.  Her insulin dose was decreased,  with the thought that maybe the hyperglycemia was secondary to worsening  renal function, and she was scheduled to follow up with her podiatrist for  the lower extremity ulcers.  She was discharged in good condition.   LABORATORIES AT THE TIME OF DISCHARGE:  White blood cell count 6.7,  hemoglobin 11.3, platelet count 240,000.  BMET showed sodium 136, potassium  4.6, chloride 107, bicarbonate 18, BUN 55, creatinine 4.5, glucose 380.      KK/MEDQ  D:  12/22/2004   T:  12/22/2004  Job:  [id number]

## 2011-01-23 NOTE — Op Note (Signed)
NAMETERRILL, WAUTERS              ACCOUNT NO.:  [id number]   MEDICAL RECORD NO.:  [id number]          PATIENT TYPE:  AMB   LOCATION:  SDS                          FACILITY:  [facility]   PHYSICIAN:  Vanita Panda. [name], M.D.DATE OF BIRTH:  03-19-1966   DATE OF PROCEDURE:  12/15/2005  DATE OF DISCHARGE:                                 OPERATIVE REPORT   PREOPERATIVE DIAGNOSIS:  1.  Right foot infection/abscess with second proximal phalanx osteomyelitis      in a patient with the end stage renal disease and diabetes.  2.  History of previous amputations of the right first, second, and third      toes.   POSTOPERATIVE DIAGNOSIS:  1.  Right foot infection/abscess with second proximal phalanx osteomyelitis      in a patient with the end stage renal disease and diabetes.  2.  History of previous amputations of the right first, second, and third      toes.   PROCEDURE:  1.  Irrigation and debridement of right foot abscess.  2.  Right foot second ray resection.   SURGEON:  Vanita Panda. [name], M.D.   ANESTHESIA:  1.  Right ankle block.  2.  General.   BLOOD LOSS:  Minimal.   COMPLICATIONS:  None.   INDICATIONS:  Briefly, Ms. Hewes is a 45 year old diabetic with end stage  renal disease who is on chronic dialysis.  She has had previous noted  peripheral vascular disease and diabetic foot wounds and a year or so ago,  underwent amputations of her great toe followed by her second and third  toes.  A couple of weeks ago, she presented with redness in her foot and x-  rays did show weathering of the remnants of the proximal phalanx on her  second toe only.  She continued to have drainage from this area and was  started on vancomycin at dialysis and it was recommended she undergo  irrigation and debridement of this wound with possible resection of the  second ray.  It is understood that if this fails to heal that she will  likely need a transmetatarsal amputation.  She agreed  to proceed with the  surgery.   DESCRIPTION OF PROCEDURE:  After informed consent was obtained, the  appropriate right ankle was marked and ankle block was obtained by  anesthesia.  She was brought to the operating room, placed supine on the  operating table, and general anesthesia was induced.  The foot was then  prepped with Betadine scrub and paint, sterile drapes were used, as well.  A  towel was put around the ankle and an Esmarch was used to wrap up the ankle.  There was noted to be an area of drainage at the area of the second ray and  I excised this area sharply with a knife and carried it proximal. There was  only minimal purulence noted of the remnants of the proximal phalanx, this  certainly showed the bone had evidence of osteomyelitis.  I cleaned the  wound back to a stable edge and then used a rongeur to debride the  bone back  to the metatarsal. The wound was then copiously irrigated with 250 mL of  bacitracin fluid followed by 3 liters of pulsatile lavage normal saline  fluid.  Once the wound was cleaned, I was able to reapproximate the soft  tissues using interrupted 2-0 nylon in a soft closure.  Xeroform was placed  over the wound followed by a sterile soft dressing.  The patient was  awakened, extubated, and taken to the  recovery room in stable condition.  There were no complications.  All final counts were correct.   Postoperatively, I will allow her to weight bear with a postop shoe.  She  will continue vancomycin for the next two weeks with dosing at dialysis and  if this fails to heal or there seems to be residual evidence of infection,  we will perform a transmetatarsal amputation.           ______________________________  Vanita Panda. [name], M.D.     CYB/MEDQ  D:  12/15/2005  T:  12/15/2005  Job:  [id number]

## 2011-01-23 NOTE — Op Note (Signed)
[place]  Patient:    [name], [name] Visit Number: [id number] MRN: [id number]          Service Type: DSU Location: 3000 [id number] Attending Physician:  Colon Branch Dictated by:   Clydene Fake, M.D. Proc. Date: 05/26/01 Admit Date:  05/26/2001                             Operative Report  PREOPERATIVE DIAGNOSIS:   Right L4-L5 herniated nucleus pulposus.  POSTOPERATIVE DIAGNOSIS:  Right L4-L5 herniated nucleus pulposus.  OPERATION:  Right L4-5 semihemilaminectomy and diskectomy.  Microdissection with microscope.  SURGEON:  Clydene Fake, M.D.  ASSISTANT:  Izell . Elesa Hacker, M.D.  ANESTHESIA:  General endotracheal.  ESTIMATED BLOOD LOSS:  Minimal.  BLOOD GIVEN:  None.  DRAINS:  None.  COMPLICATIONS:  INDICATIONS:  The patient is a 45 year old woman who has had a two to three month history of worsening right leg pain and has a corresponding disk herniation on the right side at L4-5.  She has L5 root dysfunction with slight weakness and numbness.  The patient is brought in for surgery.  DESCRIPTION OF PROCEDURE: The patient was brought into the operating room. General anesthesia was induced.  The patient was placed in the prone position on the Wilson frame with blood pressure monitoring.  The patient was prepped and draped in the usual sterile fashion.  The site of the incision was injected with 10 cc of 1% lidocaine with epinephrine.  The needle was placed in the midline of the back.  An x-ray was obtained showing that this was at the L3 spinous process.  The incision was then made and centered below where the needle was.  Incision was taken down to the fascia and hemostasis obtained with Bovie cauterization.  The fascia was incised between the L4-L5 spinous processes and subperiosteal dissection was done over the L4-L5 spinous processes and subperiosteal dissection was done over the L4 and L5 lamina to the facet exposing the  L4-L5 space. Marker was placed in this disk space. X-ray was obtained showing this was the L4-5 interspace.  Microscope was brought into the field for microdissection.  At this point, a high speed drill was used to start the semihemilaminectomy and the Kerrison punch was used to complete this.  The ligament of flavum was removed the thecal sac and nerve root were seen.  They were reflected medially.  The disk space was found. Epidural space had hemostasis obtained with bipolar cauterization and with Gelfoam and thrombin.  Disk space was incised with a 15 blade.  Diskectomy was performed with pituitary rongeurs and curets.  We did remove paramedian fragments and the nerve root and thecal sac were decompressed after this.  The disk space was irrigated with antibiotic solution.  Gelfoam was irrigated out. There was good hemostasis.  Retractors were removed.  The fascia was closed with 0 Vicryl interrupted sutures.  The subcutaneous tissue was closed with 2-0 Vicryl and 3-0 Vicryl suture and interrupted sutures and the skin closed with Steri-Strips.  Dressing was placed, and the patient was placed back in the supine position, awakened from anesthesia and returned to the recovery room in stable condition. Dictated by:   Clydene Fake, M.D. Attending Physician:  Colon Branch DD:  05/26/01 TD:  05/26/01 Job: [id number]

## 2011-01-23 NOTE — Discharge Summary (Signed)
[place]  Patient:    [name], [name]                    MRN: [id number] Adm. Date:  [id number] Disc. Date: 12/08/99 Attending:  Madaline Guthrie Dictator:   Myles Rosenthal                           Discharge Summary  DISCHARGE DIAGNOSES:  1. Diabetic ketoacidosis.  2. Diabetes mellitus type 1.  CONSULTS:  None.  PROCEDURES:  None.  HISTORY OF PRESENT ILLNESS:  The patient is a 45 year old white female who presented with a two-day history of nausea, vomiting, and high blood sugars. She does state that her blood sugars were running 400-500 in the days prior to her gastrointestinal symptoms.  She does state that she has been taking her insulin as directed.  She does state that she has been taking her insulin as directed. She does state that she has been unable to take p.o. for the last two days although her nausea has improved somewhat this morning.  She denies any chest pain, shortness of breath, dysuria, fever, chills, or other ill symptoms.  PHYSICAL EXAMINATION:  VITAL SIGNS:  Temperature 97.0, pulse 117, respirations 18, blood pressure 141/92.  GENERAL:  She is a well-developed, well-nourished, white female in no acute distress lying on the stretcher in ED.  HEENT:  Normocephalic, atraumatic.  Poor dentition.  Oropharynx with moist mucous membranes.  No exudates or erythema.  Pupils equal, reactive.  NECK:  Supple without lymphadenopathy, thyromegaly, or JVD.  CHEST:  Clear to auscultation bilaterally.  CARDIAC:  Regular rate and rhythm with a 2/6 systolic murmur at the apex. Pulses 2+ peripheral pulses.  ABDOMEN:  Soft, mildly obese, nondistended.  Normoactive bowel sounds.  No hepatosplenomegaly.  EXTREMITIES:  No edema.  She does have positive nail fungus in bilateral lower extremities.  NEUROLOGIC:  Nonfocal with the exception of decreased sensation in bilateral lower extremities.  ADMISSION LABORATORY:  ABG  7.35/23/134/13 on room air.  Hemoglobin 14.1, white count 7.0 with an ANC of 4.6, platelets 323.  i-STAT shows sodium 134, potassium 5.1, chloride 104, CO2 13, BUN 29, glucose 467, anion gap 17.  HOSPITAL COURSE:  The patient was admitted to the hospital and hydrated aggressively with normal saline.  Started on insulin drip at 5 units of insulin per hour.  The patient was followed with serial CBGs and BMETs and showed rapid improvement over the evening of admission.  The patient was started on a clear iet but was advanced to a regular ADA diet by the night of admission without any gastrointestinal symptoms.  The patient had improved dramatically and blood sugars had normalized.  Anion gap had closed and CO2 had normalized by day following admission and patient was deemed ready for discharge.  DISCHARGE LABORATORY:  Sodium 138, potassium 4.2, chloride 111, CO2 21, BUN 16,  creatinine 0.7, glucose 105.  CBG 87 and 108.  FOLLOWUP:  The patient is to follow up in outpatient clinic.  DISCHARGE INSTRUCTIONS:  The patient was instructed to check her blood sugars no less than twice a day, write these down in calendar format for a log which she s to bring in to her follow-up appointment so we may find if there needs to be adjustment on her current insulin dosages.  The patient has failed to keep follow-up appointments in the past and it was stressed to her the  importance of  this.  DISCHARGE MEDICATIONS:  Insulin 70/30, 56 units in the a.m.; 46 units in the p.m. DD:  12/08/99 TD:  12/08/99 Job: [id number]

## 2011-01-23 NOTE — Discharge Summary (Signed)
[name], [name]                          ACCOUNT NO.:  [id number]   MEDICAL RECORD NO.:  [id number]                   PATIENT TYPE:  INP   LOCATION:  5018                                 FACILITY:  [facility]   PHYSICIAN:  Madaline Guthrie, M.D.                 DATE OF BIRTH:  [birth year]   DATE OF ADMISSION:  11/01/2003  DATE OF DISCHARGE:  11/06/2003                                 DISCHARGE SUMMARY   PRIMARY CARE DOCTOR:  Dr. Dante Gang at the outpatient clinic.   DISCHARGE DIAGNOSES:  1. Right leg cellulitis.  2. 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  3. Hypertension.  4. Diabetes mellitus.  5. Hypothyroidism.   PAST MEDICAL HISTORY:  1. Type 2 diabetes with complication of peripheral neuropathy, retinopathy,     nephropathy.  2. Obesity.  3. Normocytic anemia with hemoglobin of 9.8 in September 2004.  4. Chronic renal insufficiency with baseline creatinine of 2.8.  5. Hyperlipidemia.  6. Medication noncompliance.  7. Multiple admissions for cellulitis.   DISCHARGE MEDICATIONS:  1. Cephalexin 250 mg q.6 h. x7 days.  2. Clonidine 0.1 mg p.o. b.i.d.  3. Novolin 70/30 -- 40 units in the morning and 30 units in the evening.  4. Synthroid 125 mcg p.o. daily.  5. Atenolol 100 mg p.o. daily.  6. Ibuprofen 400 mg q.6 h. as needed p.r.n. pain.   DISPOSITION AND FOLLOWUP:  The patient is to follow up with Dr. Dante Gang on October 22, 2003 at 3:55 p.m.  The patient's needs a repeat TSH,  since Synthroid was started during hospitalization, and a repeat TSH needs  to be done in 4-6 weeks.   ADMISSION HISTORY AND PHYSICAL:  [name] is a 45 year old white female  with past medical history significant for heartburn, diabetes and frequent  hospitalizations for cellulitis, who came to the ED on November 01, 2003  with cellulitis of the right leg which had gotten worse since the past 1 or  2 days.  She was previously admitted in  [facility] on 2  separate occasions with the last one in January 2005 for the same problem.  On both occasions, she was sent home on ciprofloxacin for 7 days.  She has  also had debridement and incision and drainage of the lesion in September  2004.  An MRI obtained at that time showed an abscess secondary to hematoma  but no osteomyelitis.   ALLERGIES:  No known drug allergies.   SUBSTANCE HISTORY:  The patient has never smoked and does not use alcohol,  cocaine or IV drugs.   SOCIAL HISTORY:  She is married and works in [employer] and lives with  her husband and 47-year-old son, does not have any insurance and thus cannot  afford any medications.   FAMILY MEDICAL HISTORY:  Mother had throat cancer and diabetes, father had  hypertension  and diabetes, and child, a 56-year-old son, who does not have  any medical problems.   PHYSICAL EXAM:  VITAL SIGNS:  Pulse 80, blood pressure 205/101, temperature  97.8, respirations 16, O2 saturation 90% on room air.  GENERAL:  No abnormalities detected.  RESPIRATORY:  Clear to auscultation bilaterally; no rhonchi, rales or  wheeze.  CARDIOVASCULAR:  Regular rate and rhythm.  No murmurs, rubs, or gallops.  GI:  Soft, nontender, nondistended.  Positive bowel sounds.  EXTREMITIES:  A 15 x 25-cm swelling noted on the right leg with warmth and  redness.  Also, her sensations are absent below the right leg and half below  the left leg.  The patient also has a black eschar on the base of the first  toe.   ADMISSION LABORATORIES:  Sodium 140, potassium 2.7, chloride 114, bicarb 21,  BUN 39, creatinine 3.2, glucose 71.  Hemoglobin 9.3, WBC 7.4, platelet count  295,000.  Bilirubin 0.4, alkaline phosphatase 101, SGOT 21, SGPT 16, protein  6.5, albumin 2.2.  UA showed moderate blood, protein greater than 300,  glucose 250 and rbc's 7-10.   HOSPITAL COURSE:  PROBLEM #1 - RIGHT LEG CELLULITIS:  Patient was initially  started on IV  antibiotics, since unable to take any oral medicines.  The  patient was initially started on vancomycin and Zosyn to cover for MRSA and  cover for Pseudomonas, since the patient had history of recent  hospitalization.  The antibiotics were later switched to cefazolin, which  the patient is to receive for another 7 days after discharge.  Blood  cultures obtained during hospitalization grew gram-positive rods x1.  Wound  culture grew gram-negative Staph.  Wound Care was consulted regarding the  black eschar at the base of the right great toe which was thought to be the  portal of infection for recurrent cellulitis.  The patient was not referred  to a podiatrist, she could not afford to go to a podiatrist.   PROBLEM #2 - ACUTE RENAL FAILURE ON CHRONIC RENAL FAILURE SECONDARY TO  DIABETIC NEPHROPATHY:  Creatinine on admission was 3.2.  The patient was  given IV fluids and hydrated and creatinine dropped to 2.7, which is her  baseline.  Creatinine was stable during the rest of the hospitalization  stay.   PROBLEM #3 - HYPERTENSION:  The patient was started on home dose of atenolol  100 mg daily, however, the patient continued to be hypertensive and thus a  clonidine patch was added.  She was finally discharged home on atenolol and  clonidine and blood pressure needs to be monitored on an outpatient basis.   PROBLEM #4 - DIABETES MELLITUS:  The patient was well-controlled on home  insulin regimen.  Hemoglobin A1c obtained during hospitalization was 8.5 and  she was discharged home on home regimen of insulin.   PROBLEM #5 - HYPOTHYROIDISM:  The patient had a recent diagnosis of  hypothyroidism but was not taking her Synthroid because could not afford it.  TSH obtained during hospitalization was 32.7; the patient was restarted on  0.125 mcg of Synthroid and a TSH needs to be monitored in 4-6 weeks on an outpatient basis to adjust her Synthroid dose.   PROBLEM #6 - ALL OTHER MEDICAL ILLNESSES:   All other medical illnesses were  stable during hospitalization stay and managed with home medications.   DISCHARGE LABORATORIES:  Hemoglobin 9.6, WBC 5.8, platelets 268,000.  Sodium  138, potassium 4.7, chloride 115, bicarb 19, BUN 31, creatinine 2.8, glucose  47.      DICTATOR Quinnton Bury                          Madaline Guthrie, M.D.    DD/MEDQ  D:  02/20/2004  T:  02/21/2004  Job:  [id number]

## 2011-01-23 NOTE — Op Note (Signed)
[place]  Patient:    [name], [name]                       MRN: [id number] Proc. Date: 08/12/00 Adm. Date:  [id number] Attending:  Bertrum Sol                           Operative Report  DATE OF BIRTH:  1966/02/19  PREOPERATIVE DIAGNOSIS:  Vitreous hemorrhage, proliferative diabetic retinopathy, left eye.  POSTOPERATIVE DIAGNOSIS:  Vitreous hemorrhage, proliferative diabetic retinopathy, left eye.  OPERATION PERFORMED:  Pars plana vitrectomy with retinal photocoagulation, left eye.  Gas-fluid exchange, left eye.  Membrane peel, left eye.  SURGEON:  Beulah Gandy. Ashley Royalty, M.D.  ASSISTANT:  Lu Duffel, COA, SA  ANESTHESIA:  General.  DESCRIPTION OF PROCEDURE:  Usual prep and drape.  Peritomies at 10, 2 and 4 oclock.  4 mm angled infusion port anchored into place at 4 oclock.  A lighted pick and a cutter were placed at 10 and 2 oclock respectively.  The contact lens ring was anchored into place at 6 and 12 oclock. Methylcellulose was placed on the cornea and the flat contact lens was placed. The pars plana vitrectomy was begun just behind the crystalline lens.  Blood and vitreous was encountered.  This was carefully removed.  The vitrectomy was carried down to the macular surface where blood and vitreous were carefully removed under low suction and rapid cutting.  A stalk extended from the disk down to the lower nasal quadrant.  This was engaged with a pick and lifted free from its attachments to both ends.  The vitreoretinal adhesions were lasered.  Additional vitrectomy was carried out for 360 degrees with the 30 degree prismatic lens and the vitreous cutter.  The cutting was on high speed and suction was low.  Scleral depression was used to gain access to the far peripheral vitreous space.  Once this was accomplished, a washout procedure was performed.  The endolaser was then positioned in the eye and 670 burns were placed around  the retinal periphery with a power between 400 and 600 miliwatts, 0.1 second each, 1000 microns each.  The [employer] brush was used to vacuum blood from the posterior surface of the eye.  A gas-fluid exchange was carried out.  The instruments were removed from the eye and 9-0 nylon was used to close the sclerotomy sites. The wounds were tested and found to be tight.  The conjunctiva was closed with wetfield cautery.  Polymyxin and gentamicin were irrigated into Ten[place] space.  Atropine solution was applied. Decadron 10 mg was injected into the lower subconjunctival space.  Closing tension was 10 with a Barraquer tonometer.  COMPLICATIONS:  None.  DURATION:  One hour.  Polysporin, a patch and shield were placed.  The patient was awakened and taken to recovery in satisfactory condition. DD:  08/12/00 TD:  08/12/00 Job: [id number]

## 2011-01-23 NOTE — Op Note (Signed)
NAMEHERBERT, [name]                          ACCOUNT NO.:  [id number]   MEDICAL RECORD NO.:  [id number]                   PATIENT TYPE:  OIB   LOCATION:  2899                                 FACILITY:  [facility]   PHYSICIAN:  Quita Skye. Hart Rochester, M.D.               DATE OF BIRTH:  May 27, 1966   DATE OF PROCEDURE:  04/21/2004  DATE OF DISCHARGE:                                 OPERATIVE REPORT   PREOPERATIVE DIAGNOSIS:  End-stage renal disease.   POSTOPERATIVE DIAGNOSIS:  End-stage renal disease.   OPERATION:  Insertion of a right forearm AV Gore-Tex graft from brachial  artery to basilic vein (4 mm - 7 mm stretch).   SURGEON:  Quita Skye. Hart Rochester, M.D.   FIRST ASSISTANT:  Nurse.   ANESTHESIA:  Local.   DESCRIPTION OF PROCEDURE:  The patient was taken to the operating room,  placed in the supine position at which time the right upper extremity was  prepped with Betadine scrubbing solution and draped in routine sterile  manner.  After infiltration with 1% Xylocaine with epinephrine, a transverse  incision was made in the antecubital area.  The antecubital vein was  dissected free.  The cephalic branch was essentially absent, being quite  small.  The basilic branch was 4 mm in size and was adequate for graft but  borderline.  Brachial artery was then exposed beneath the fascia and it was  a small vessel also being about 3 mm in size.  Loop shaped tunnel was then  created after infiltrating with 0.5% Xylocaine.  The 4 x 7 mm stretch Gore-  Tex graft delivered through the tunnel and 3000 units of heparin given  intravenously.  Artery was occluded proximally and distally with Vesi-loops,  opened with 15 blade, extended with Potts scissors.  The 4 mm end of the  graft was spatulated and anastomosed end-to-side with 6-0 Prolene.  The 7 mm  end of the graft was then anastomosed end-to-side to the basilic vein with 6-  0 Prolene, clamps released and there was an excellent pulse and thrill in  the  graft.  There was also present radial and ulnar flow with the graft  open.  No protamine was given.  The wound was irrigated with saline, closed  in layers with Vicryl in a subcuticular fashion.  Sterile dressing applied.  The patient was taken to the recovery room in satisfactory condition.                                               Quita Skye Hart Rochester, M.D.    JDL/MEDQ  D:  04/21/2004  T:  04/21/2004  Job:  [id number]

## 2011-01-23 NOTE — Discharge Summary (Signed)
NAMEKERRYANN, [name]                          ACCOUNT NO.:  [id number]   MEDICAL RECORD NO.:  [id number]                   PATIENT TYPE:  INP   LOCATION:  3729                                 FACILITY:  [facility]   PHYSICIAN:  Tawny Asal, MD             DATE OF BIRTH:  1966/04/04   DATE OF ADMISSION:  01/13/2004  DATE OF DISCHARGE:  01/16/2004                                 DISCHARGE SUMMARY   DISCHARGE DIAGNOSES:  1. Right leg ulcers.  2. Renal insufficiency.  3. Hypothyroidism.  4. Diabetes mellitus.  5. Hyperkalemia.  6. Hypertension.  7. Anemia.  8. Vomiting.  9. Dysphagia.  10.      Dyslipidemia.   DISCHARGE MEDICATIONS:  1. Atenolol 100 mg one tab p.o. every day.  2. Keflex 500 mg one tab p.o. q.i.d.  3. Clonidine 0.2 mg two tabs b.i.d.  4. Insulin 70/30 45 units in the morning and 25 units in the evening.  5. Synthroid 0.125 one tab every day.  6. Metoclopramide 10 mg one tab before meals and q.h.s.  7. Protonix 40 mg every day.  8. Zocor 20 mg p.o. every day.  9. Tylenol 325 two tabs p.o. q.4-6h. p.r.n. for pain.   DISPOSITION:  The patient to follow up on Friday for a lab visit at 3 p.m.  at [facility], then with Dr. Alfonse Alpers at [facility], Jan 22, 2004 at 10:50 a.m., and then at [facility] with Dr. ________ Feb 01, 2004 at 10:45 a.m.   PROCEDURE:  Renal ultrasound, impression:  Normal right kidney, left kidney,  and bladder.   BRIEF HISTORY AND PHYSICAL:  This is a 45 year old black female with a past  medical history significant for hypertension, chronic renal insufficiency,  anemia, dyslipidemia, diabetes mellitus (positive complications of lower  extremity ulcers, retinopathy, and neuropathy), also four admissions for  cellulitis (last D/C'd on December 26, 2003).  She presents today with right  skin lesion x 3 ( a 1 x 2-cm, a 1 x 2.5-cm, and a 1 x 1-cm lesion).  Same  lesions the patient was admitted  for in April, had been getting better and  finishing course of Levaquin 750 mg every day x 10 days.  The patient states  that on Wednesday she bumped into a chair and hit her leg at the site of one  of the lesions.  This started bleeding but no discharge or exudate.  She  presented to urgent care, on Jan 09, 2004, secondary to pain at the site of  lesion with bleeding.  She was given silver Silvadene and cephalexin.  The  lesion continued to hurt, and bleed, and became more erythematous, so the  patient decided to come to the ED.  She denies fever, chills, no exudate  from wounds.   PHYSICAL EXAMINATION:  VITAL SIGNS:  Pulse 77, BP 165/107, temperature 97.6,  respiratory  rate 20, O2 sat 100% on room air.  GENERAL:  Obese, no acute distress.  EYES:  Pupils equal, round, reactive to light and accommodation.  Extraocular muscles intact.  ENT:  Oropharynx clear.  No erythema or exudate.  NECK:  Supple.  No cervical lymphadenopathy, no JVD.  LUNGS:  Clear to auscultation bilaterally in the posterior lung fields.  CV:  Regular rate and rhythm.  No murmurs.  GI:  Positive bowel sounds, soft, nontender, nondistended, no organomegaly.  EXTREMITIES:  Trace ankle edema.  Dorsalis pedis pulses 1+.  Three lesions  on the right shin as described.  SKIN:  No obvious rash.  LYMPH:  No axillary lymphadenopathy.  MUSCULOSKELETAL:  Five out of five throughout.  NEURO:  Cranial nerves II-XII intact.  Reflexes 2+ and symmetric.  No  cerebellar dysfunction.  No feeling from mid-shin down to toes.  Toes  upgoing.  PSYCH:  Appropriate.   LABS:  Sodium 135, potassium 6.6, chloride 107, bicarb 20, BUN 108,  creatinine 4.8, glucose 324.  Liver function test normal, except for albumin  of 2.6.  CBC:  White count 10.3 with an ANC of 8.1 (78%), hemoglobin 11.1,  MCV of 77.3, platelets 413.  UA revealed greater than 1,000 glucose, small  bili, 15 ketones, large blood.  Urine microscopic showed RBCs too numerous   to count.  She had a negative urine pregnancy test.   The patient was admitted with leg ulcers.   HOSPITAL COURSE:  1. Left leg ulcers, pending wound cultures, but the culture from leg lesion,     on December 22, 2003, shows abundant Staphylococcus sensitive to cefazolin,     clindamycin, erythromycin, gentamicin, levofloxacin, oxacillin,     penicillin, rifampin, vancomycin, tetracycline, ampicillin-sulbactam.     The patient was started on cefazolin as this was thought to be the least     renal toxic.  Pharmacy dosed her cefazolin per her renal insufficiency.     Her cultures never returned positive for MRSA, and the patient was     discharged on Keflex one tab q.i.d. for her ulcers.  2. Renal insufficiency.  The patient had a creatinine of 2.8 on September     4th, and 2.5 on February 2005, and then 3.7 on April 2005, and on day of     admission was 4.8.  She had an appointment with [facility], Dr.     Hyman Hopes, in April.  Per the patient, she was told that she would likely need     dialysis within a year.  The patient was hemodynamically stable with no     evidence of volume overload.  Her potassium was 6.6.  Her renal     ultrasound was normal.  She was treated with Kayexalate for her     hyperkalemia which resolved.  She was discharged with a followup     appointment at [facility], on May 27th at 10:45, to evaluate her     further for her renal insufficiency.  3. Diabetes mellitus.  The patient was started on her home regimen of 70/30.     Her blood sugars were reasonably controlled, and she was discharged with     insulin 70/30, 45 units in the morning and 25 at night.  4. For her hypertension, she was treated with atenolol 100 mg p.o. every     day, and Lisinopril 20 mg p.o. every day, and Clonidine 0.1 mg b.i.d.     Her blood pressure was reasonably  well controlled.  She was discharged on    atenolol 100 every day, Clonidine 0.2 mg b.i.d., and her ace discontinued      secondary to her renal insufficiency.  Ace was held on admission.  5. Hypothyroidism.  The patient stated that she was no longer taking her     Synthroid and had very elevated TSH.  She was started back on her home     dose of 0.125 mg per day and was discharged with this medicine.  6. Hyperlipidemia.  The patient found to have elevations in her fasting     lipid panel and was started on Zocor 20 mg p.o. every day.  7. Anemia.  Patient admitted with a low hemoglobin.  She was worked up for     this with an iron panel that revealed total iron of 84, total iron-     binding capacity of 63, percent sat of 32, B-12 of 472, RBC folate of     681, and a ferritin of 36.  Her hemoglobin ran stable throughout her     hospitalization.  Given the patient's workup, she is a candidate for     outpatient workup including colonoscopy.   DISCHARGE LABORATORIES:  CBC:  White count 8.1, hemoglobin 8.9, hematocrit  27.3, platelets of 338.  BMP:  Sodium 140, potassium 4.2, chloride 118, CO2  20, glucose 63, BUN 57, creatinine 3.6, calcium 7.4.   The patient was discharged with followup as described.                                                Tawny Asal, MD    CW/MEDQ  D:  02/07/2004  T:  02/09/2004  Job:  [id number]   cc:   [facility]   Rab (?), M.D.

## 2011-01-23 NOTE — Op Note (Signed)
   NAMEJULIETT, [name]                          ACCOUNT NO.:  [id number]   MEDICAL RECORD NO.:  [id number]                   PATIENT TYPE:  INP   LOCATION:  5727                                 FACILITY:  [facility]   PHYSICIAN:  Sharlet Salina T. Hoxworth, M.D.          DATE OF BIRTH:  04/06/1966   DATE OF PROCEDURE:  06/10/2003  DATE OF DISCHARGE:                                 OPERATIVE REPORT   PREOPERATIVE DIAGNOSIS:  Eschar and cellulitis, left foot.   POSTOPERATIVE DIAGNOSIS:  Eschar and cellulitis, left foot with large  subcutaneous hematoma.   PROCEDURE:  Debridement, incision and drainage of left foot.   SURGEON:  Lorne Skeens. Hoxworth, M.D.   ANESTHESIA:  General.   INDICATIONS FOR PROCEDURE:  [name] is a 44 year old diabetic who  dropped a large piece of wood on the dorsum of her left foot about 2 weeks  ago. She now presents with an area of necrotic skin  over the dorsum of her  foot with a large area of swelling, some fluctuance and erythema overlying  this. An incision and drainage and debridement in the operating room has  been recommended and accepted. She is brought to the operating room for this  procedure.   DESCRIPTION OF PROCEDURE:  The patient was brought to the operating room and  placed in the supine position on the operating table and laryngeal mask  general endotracheal anesthesia was induced. She was already on broad-  spectrum antibiotics. The left foot was sterilely prepped and draped.   The well demarcated necrotic eschar was then sharply excised into the subcu  back to healthy skin. This dissection entered into a large subcutaneous  hematoma with organized clot that was completely evacuated. There was no  pus. This area was cultured. There was some necrotic but not obviously  infected subcutaneous tissue that was debrided. This extended 2 to 3 cm back  from the edge of the wound. All this material was evacuated and the  underlying tissue   appeared  healthy.   The wound was copiously irrigated with saline. It was then packed with moist  saline gauze and a dry sterile dressing was applied. The patient was taken  to the recovery room in good condition.                                               Lorne Skeens. Hoxworth, M.D.    Tory Emerald  D:  06/10/2003  T:  06/10/2003  Job:  [id number]

## 2011-01-23 NOTE — Op Note (Signed)
[name], BULLUCK                          ACCOUNT NO.:  [id number]   MEDICAL RECORD NO.:  [id number]                   PATIENT TYPE:  OIB   LOCATION:  2899                                 FACILITY:  [facility]   PHYSICIAN:  Di Kindle. Edilia Bo, M.D.        DATE OF BIRTH:  [birth year]   DATE OF PROCEDURE:  03/20/2004  DATE OF DISCHARGE:                                 OPERATIVE REPORT   PREOPERATIVE DIAGNOSIS:  Chronic renal failure.   POSTOPERATIVE DIAGNOSIS:  Chronic renal failure.   PROCEDURE:  Placement of new left upper arm arteriovenous graft, 4- to 7-mm  tapered PTFE graft.   SURGEON:  Di Kindle. Edilia Bo, M.D.   ASSISTANT:  Toribio Harbour, N.P.   ANESTHESIA:  Local with sedation.   TECHNIQUE:  The patient was taken to the operating room and sedated by  anesthesia.  The left upper extremity was prepped and draped in the usual  sterile fashion.  After the skin was infiltrated with 1% lidocaine, a  longitudinal incision was made above the antecubital level.  Here, the veins  were very small and I did not think she was a candidate for a forearm graft.  She had previously had her cephalic vein mapped and this was very small  also.  The artery was also small.  A separate longitudinal incision was made  beneath the axilla after the skin was anesthetized and here the high  brachial vein was dissected free and this was a nice large vein.  The  adjacent artery was small and I did not think there was a big advantage to  using this as inflow as opposed to the brachial artery at the antecubital  level.  I therefore decided to place an upper arm straight graft.  The skin  was anesthetized.  A 4- to 7-mm tapered graft was then tunneled in the upper  arm and the patient was then heparinized.  The brachial artery was clamped  proximally and distally and a longitudinal arteriotomy was made.  A short  segment of the 4-mm end of the graft was excised, the graft slightly  spatulated and sewn end-to-side to the brachial artery using continuous 6-0  Prolene suture.  The graft was then pulled to the appropriate length for  anastomosis to the brachial vein.  The vein was ligated distally and  spatulated and the graft was cut to the appropriate length, spatulated and  sewn end-to-end to the vein using continuous 6-0 Prolene suture.  At the  completion, there was an excellent thrill in the graft and a good radial and  ulnar signal with a Doppler.  Hemostasis was obtained in the wounds.  The  wounds were closed with a deep layer of 3-0 Vicryl and the skin closed with  4-0 Vicryl.  The heparin had been partially reversed with protamine.  A  sterile dressing was applied.  The patient tolerated the procedure well and  was transferred to  the recovery room in satisfactory condition.  All needle  and sponge counts were correct.                                               Di Kindle. Edilia Bo, M.D.    CSD/MEDQ  D:  03/20/2004  T:  03/20/2004  Job:  [id number]

## 2011-01-23 NOTE — Consult Note (Signed)
[name], [name]                          ACCOUNT NO.:  [id number]   MEDICAL RECORD NO.:  [id number]                   PATIENT TYPE:  INP   LOCATION:  5727                                 FACILITY:  [facility]   PHYSICIAN:  Sharlet Salina T. Hoxworth, M.D.          DATE OF BIRTH:  06-Sep-1966   DATE OF CONSULTATION:  06/09/2003  DATE OF DISCHARGE:                                   CONSULTATION   CHIEF COMPLAINT:  Redness, swelling, discomfort left foot.   HISTORY OF PRESENT ILLNESS:  I was asked by the medical teaching service to  evaluate [name].  She is a 45 year old diabetic white female  admitted to the service of Dr. Darlina Sicilian.  She gives a history of  approximately two weeks ago dropping a heavy piece of wood on the dorsum of  her left foot.  She has significant diabetic neuropathy, and this did not  really cause any pain.  She noticed some bruising and discoloration of the  skin which gradually darkened.  She did present to [facility]  at one point and was treated with oral Keflex.  She, however, has noticed  increased discoloration of the skin and now several days of redness and  swelling to the foot.  No definite fever or chills.  There is mild  discomfort but, again, she has significant neuropathy.  The patient was  admitted for further treatment and evaluation.   PAST MEDICAL HISTORY:  1. Insulin-dependent diabetes mellitus.  2. Hypothyroidism.  3. Hypertension.  4. Renal insufficiency.  5. History of DKA.  6. History of medical noncompliance.   ADMISSION MEDICATIONS:  1. Insulin 70/30, 60 in the morning and 40 in the evening.  2. Synthroid 75 mcg daily.  3. Lasix 40 mg daily.  4. Keflex 500 mg q.i.d.  5. Iron.  6. Vicodin p.r.n.   She has no known drug allergies.   SOCIAL HISTORY:  Married.  Drinks occasional alcohol.  Does not smoke  cigarettes.   FAMILY HISTORY:  Noncontributory.   REVIEW OF SYSTEMS:  GENERAL:  No fever or chills.   NEUROLOGIC:  She does  have numbness of her lower extremities in a stocking distribution.   PHYSICAL EXAM:  VITAL SIGNS:  Afebrile.  Vital signs all within normal  limits.  GENERAL:  Mildly obese, pleasant white female, somewhat unkempt.  SKIN:  Warm and dry.  CHEST:  Clear.  ABDOMEN:  Soft and nontender.  EXTREMITIES:  There is 1 to 2+ pitting edema of both lower extremities.  On  the dorsum of the left foot is a 5 x 4 cm area, well demarcated, of necrotic  eschar.  There are several centimeters of surrounding moderate erythema and  swelling.  No definite fluctuance.  No purulent drainage.  VASCULAR:  Feet are warm and appear well perfused.  Peripheral pulses are  not palpable.  This may be secondary to edema.  LABORATORY:  Electrolytes normal.  BUN elevated at 35, creatinine 2.7.  White count is 7.1, hemoglobin is 9.  Albumin is 2.2.   X-ray of the foot shows soft-tissue swelling with no bony abnormality.   ASSESSMENT AND PLAN:  The patient is a 45 year old diabetic who is  noncompliant, has significant neuropathy.  She has an injury to the left  foot which has progressed to skin and possibly subcutaneous necrosis and  cellulitis.  She will require debridement of this ulcer back to healthy  tissue and aggressive wound management.  She is already on broad-spectrum  antibiotics.  Will plan debridement in the operating room.                                               Lorne Skeens. Hoxworth, M.D.    Tory Emerald  D:  06/09/2003  T:  06/11/2003  Job:  [id number]

## 2011-01-23 NOTE — Consult Note (Signed)
[place]  Patient:    [name], [name]                       MRN: [id number] Proc. Date: 11/21/00 Adm. Date:  [id number] Disc. Date: [id number] Attending:  Doug Sou                          Consultation Report  PRIMARY CARE PHYSICIAN:  Julieanne Manson, M.D.  CHIEF COMPLAINT:  The patient is 45 years old, with type 2 diabetes, insulin-dependent the last two years.  Chief complaint is headaches and frequent urination.  HISTORY OF PRESENT ILLNESS:  The patient is 45 years old, with type 2 diabetes insulin-dependent for two years, complaining of a one-day history of headache, dizziness, with polyuria and polydipsia.  She checked her sugar.  It was 500 at home, and she was feeling thirsty.  She does give a history of two day history of URI and some nasal drip and no fevers, mild diarrhea and no vomiting, had noted increased frequency with her urination and with her thirst.  In the emergency room, her blood sugar was found to be 473 and her BUN elevated to 23 and creatinine of 0.9.  All other labs were normal.  She was started on IV fluids of normal saline.  At the time of my exam, she was feeling much better after 2 L of fluids.  Her blood sugar had dropped down to 238.  Her headache and dizziness had resolved, and she had no nausea or vomiting and feeling much better.  PAST MEDICAL HISTORY:  Type 2 diabetes, on insulin.  A year and a half ago, she had hyperglycemia and admitted in a diabetic coma.  PAST SURGICAL HISTORY:  C-section.  MEDICATIONS:  She is on Humulin 70/30 56 units in the morning and 46 units in the p.m.  ALLERGIES:  None.  SOCIAL HISTORY:  She is married, one daughter.  FAMILY HISTORY:  Strong family history of diabetes in the family.  LABORATORY DATA:  UA was negative.  Urine HCG was negative.  CBC:  White count of 5.1, hemoglobin 10.7.  Chemistries:  Sodium 133, potassium 3.9, bicarbonate of 28, BUN 23, creatinine  0.9.  Urine ketones was negative.  Chest x-ray negative.  PHYSICAL EXAMINATION:  VITAL SIGNS:  Blood pressure 147/60, pulse of 80, respirations of 18, 100% saturations.  GENERAL:  A well-appearing female in no acute distress, alert and oriented, pleasant.  CHEST:  Lungs were clear.  CARDIAC:  S1, S2 without any murmurs.  ABDOMEN:  Soft, positive bowel sounds.  EXTREMITIES:  There was no edema.  IMPRESSION:  Hyperglycemia without any evidence of any ketoacidosis. Dehydration.  Patient is continuing fluids after IV hydration with normal saline, did not require any insulin.  PLAN:  Continue Humalog 70/30 as at home, follow blood sugars closely. Instructions given.  She is to follow up with Dr. Delrae Alfred next week. DD:  11/21/00 TD:  11/22/00 Job: [id number]

## 2011-01-23 NOTE — Op Note (Signed)
NAMERAINNA, NEARHOOD              ACCOUNT NO.:  [id number]   MEDICAL RECORD NO.:  [id number]          PATIENT TYPE:  OIB   LOCATION:  2899                         FACILITY:  [facility]   PHYSICIAN:  Vanita Panda. [name], M.D.DATE OF BIRTH:  Nov 22, 1965   DATE OF PROCEDURE:  02/20/2005  DATE OF DISCHARGE:  02/20/2005                                 OPERATIVE REPORT   PREOPERATIVE DIAGNOSIS:  Right second and third toe ischemia.   POSTOPERATIVE DIAGNOSIS:  Right second and third toe ischemia.   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   SURGEON:  Vanita Panda. [name], MD.   ANESTHESIA:  Ankle block.   BLOOD LOSS:  Minimal.   COMPLICATIONS:  None.   INDICATIONS:  Briefly, Ms. Gradillas is a 45 year old with poorly controlled  diabetes.  She had developed a wound at the end of her great toe several  months ago.  She had to undergo a great toe amputation but distal to the MTP  joint.  She showed great ability to heal this wound and it did heal.  Lower  extremity vascular studies were done and showed that she had microvascular  disease, but her larger vessels were patent.  She does have palpable pulses  in her feet.  She had then developed ischemic changes at the end of her  second and third toes and demonstrated the inability to heal wounds at the  ends of these toes.  It was recommended she undergo amputation of these  toes, and she agreed to proceed with surgery.   PROCEDURE DESCRIPTION:  After informed consent was obtained, an ankle block  was obtained and Ms. Lenig was brought to the operating room and placed  supine on the operating room and placed supine on the operating table.  Her  leg was prepped from the knee down to the toes with DuraPrep, and then  sterile drapes were applied.  A towel was placed around the ankle and then  an Esmarch was wrapped around the ankle as a local tourniquet.  The second  and third digits were then removed sharply with a knife,  leaving a good flap  posteriorly for closure.  There was abundant bleeding noted, and once the  tourniquet was let down and the wound was cleaned the amputations were  carried back to the base of the proximal phalanx on each toe.  Again, there  was plenty of soft tissue for coverage.  After copious irrigation was done,  the tissue was reapproximated with interrupted 3-0 nylon suture in a loose  format to allow for healing.  Xeroform was then applied followed by a well-  padded dressing including  dressing sponges, soft Kling, and Coban.  The ankle Esmarch again was  removed to allow for assessment of her bleeding and healing ability.  Postoperatively, she will be placed in a postop and be allowed to weight-  bear more through her heel.  Followup will be established in one week.     _    CYB/MEDQ  D:  02/20/2005  T:  02/22/2005  Job:  [id number]

## 2011-01-23 NOTE — Discharge Summary (Signed)
[name], SLOAN                          ACCOUNT NO.:  [id number]   MEDICAL RECORD NO.:  [id number]                   PATIENT TYPE:  INP   LOCATION:  5727                                 FACILITY:  [facility]   PHYSICIAN:  Fransisco Hertz, M.D.               DATE OF BIRTH:  [birth year]   DATE OF ADMISSION:  06/08/2003  DATE OF DISCHARGE:  06/13/2003                                 DISCHARGE SUMMARY   CONSULTANT:  Sharlet Salina T. Hoxworth, M.D., general surgery.   PRIMARY CARE PHYSICIAN:  Dante Gang, M.D.   DISCHARGE DIAGNOSES:  1. Cellulitis.  2. Diabetes mellitus.  3. Hypertension.  4. Hypothyroidism.  5. Chronic renal insufficiency.  6. History of medical noncompliance.   DISCHARGE MEDICATIONS:  1. Lasix 40 mg p.o. daily.  2. Synthroid 125 mcg one p.o. daily.  3. Insulin 70/30, 35 units q.a.m. q.d. and 35 units q.p.m. q.d.  4. Augmentin 500 mg p.o. b.i.d. for seven days.  5. Maalox to take as needed for reflux.  6. Ferrous Sulfate 325 mg p.o. b.i.d.  7. Atenolol 100 mg p.o. q.24h.   CONDITION AT DISCHARGE:  Improved.   The patient was given instructions to attend her follow-up visit the day  after discharge on June 14, 2003, with Dr. Alfonse Alpers at the [facility]. She did not  attend this visit. Additionally, she is scheduled to see Dr. Elvis Coil on  Thursday, July 05, 2003, at 2:30 p.m., and finally, she is supposed to  call Dr. Jamse Mead office at [phone] in three weeks for an appointment to  follow up surgical debridement of her left foot cellulitis. When she does  decide to return to [facility] it is important for her blood pressure to be  assessed. There was talk of changing her atenolol to a calcium channel  blocker. Additionally, with her poor blood sugar control, her insulin should  be adjusted, and finally she should apply for medical assistance program at  the [facility] clinic.   PROCEDURES:  1. On June 10, 2003, surgical debridement of the left dorsal surface of  the foot cellulitis.  2.     She had a bilateral venous doppler on June 11, 2003, that showed within     normal limits.  3. She had an MRI of her lower extremity, which was negative for     osteomyelitis, but did show fluid collection on the dorsal surface of the     foot.        Eliseo Gum, M.D.                      Fransisco Hertz, M.D.    KC/MEDQ  D:  06/17/2003  T:  06/17/2003  Job:  [id number]   cc:   Neal Dy, M.D.  [address]  [place]  [place] [address]  Fax: [phone]   Lorne Skeens. Hoxworth,  M.D.  [address], [address]  [place]  [place] [address]  Fax: [phone]

## 2011-01-23 NOTE — Consult Note (Signed)
[place]. [facility]  Patient:    [name], [name] Visit Number: [id number] MRN: [id number]          Service Type: DSU Location: 3000 [id number] Attending Physician:  Colon Branch Dictated by:   Alfonse Alpers. Dagoberto Ligas, M.D. Proc. Date: 05/27/01 Admit Date:  05/26/2001 Discharge Date: 05/27/2001                            Consultation Report  BRIEF HISTORY:  This is a 45 year old woman who was admitted to the hospital to have back surgery.  She has a history of diabetes mellitus which has been present for 3 years. She has had several hospitalization for ketoacidosis and has glucose levels which have been increasing and decreasing periodically.  She apparently has had poor diabetes control.  She states her glucoses range from 26 to 500.  She has several admissions for ketoacidosis, a history of numbness in both feet, and also a history of having had a probable vitrectomy done on the eye.  She also has a history of hypertension.  PAST MEDICAL HISTORY:  Essentially negative except for a C-section for her baby.  MEDICATIONS:  Prior to admission: 1. 58 units of lentis insulin in the evening. 2. She takes a certain amount of regular insulin at lunch time.  PERSONAL HISTORY:  She does not smoke or drink excessive amounts of alcohol.  ALLERGIES:  No history of allergies.  FAMILY HISTORY:  The mother is 42, the father is 23, and both are in good health with no diabetes.  She has 1 brother who is 25 and 1 sister who is 30 and neither of them have diabetes.  She is married and has 1 child in good health.  REVIEW OF SYSTEMS:   Her weight has been stable.  Cardiovascular/respiratory: No chest pain.  PHYSICAL EXAMINATION:  GENERAL:  This is a well-developed moderately obese woman in no distress.  Her skin is pale.  HEENT:  Normocephalic.  NECK:  Supple.  BACK:  She has recently had surgery on her back.  LUNGS:  Clear.  CARDIOVASCULAR:   Regular.  ABDOMEN:  Soft.  IMPRESSION: 1. Recovering from back surgery. 2. Diabetes mellitus type 1.  DISCUSSION:  The patient apparently has poor control of her diabetes and I have offered the opportunity of coming to our office and we will work with her in changing this.  In view of the fact that she has been stable at this time with reference to her diabetes she will be discharged with that same dose of insulin but later she would benefit with a proactive approach to her diagnosis using humalog insulin at meal times.  She stated that she will be checking with her insurance company.  I left her a telephone number for her to call us and we will pick up her care at that time if she wants.  Thank you for the opportunity in seeing this patient. Dictated by:   Alfonse Alpers. Dagoberto Ligas, M.D. Attending Physician:  Colon Branch DD:  05/27/01 TD:  05/27/01 Job: [id number]

## 2011-01-23 NOTE — Discharge Summary (Signed)
NAMEHIROMI, [name]                          ACCOUNT NO.:  [id number]   MEDICAL RECORD NO.:  [id number]                   PATIENT TYPE:  INP   LOCATION:  6529                                 FACILITY:  [facility]   PHYSICIAN:  Madaline Guthrie, M.D.                 DATE OF BIRTH:  04-25-1966   DATE OF ADMISSION:  02/26/2003  DATE OF DISCHARGE:  02/27/2003                                 DISCHARGE SUMMARY   DISCHARGE DIAGNOSES:  1. Right lower extremity pain, rule out deep venous thrombosis. Doppler     ultrasound of the lower extremity is negative for deep venous thrombosis,     negative for a Baker's cyst as well as superficial thrombosis, but a     technically difficult study.  2. Hypertension.  3. Diabetes.  4. Renal insufficiency secondary to diabetes and hypertension.   DISCHARGE MEDICATIONS:  1. Humulin insulin 70/30, 60 units q.a.m. and 40 units q.p.m.  2. Hydrochlorothiazide 25 mg 1 p.o. daily.  3. Iron sulfate 325 mg 1 p.o. b.i.d.  4. Mycolog cream apply to stomach and belly b.i.d.  5. Tylenol 350 mg 1 p.o. q.6h. p.r.n.   LABORATORY DATA:  The patient was admitted to the hospital. Ultrasound of  lower extremities and venous Dopplers showing no obvious evidence of DVT,  superficial thrombosis, or Baker's cyst noted bilaterally. It was a  technically difficult study.   FOLLOW UP:  She is to follow up with Dr. Alfonse Alpers in the outpatient clinic on  July 9 at 3:55 p.m. for a regular checkup.   HISTORY OF PRESENT ILLNESS WITH ADMISSION LABS:  [name] is a 45-  year-old white female with chronic renal insufficiency secondary to diabetes  and hypertension who presented to the ED with a one week history of right  lower extremity pain and two day history of burning in the outer aspect of  the right lower extremity. The pain got worse over the past one week. She  reports bilateral lower extremity edema, which has been chronic and stable.  She denies any lower extremity  arrhythmia. Denies any fever, nausea,  vomiting, diarrhea. No chest pain, no shortness of breath.   ALLERGIES:  No known drug allergies.   SOCIAL HISTORY:  She is a former smoker who quit about three years ago.  Alcohol occasional. No cocaine, no IV drugs. She is self-pay. She is  married. Lives with her husband and son.   FAMILY HISTORY:  Mother has throat cancer. Father has hypertension and  diabetes. Siblings:  A sister and a brother and has a three year old son.   PHYSICAL EXAMINATION:  VITAL SIGNS:  Pulse 89, blood pressure 193/91,  temperature 98, respiratory rate 20, O2 saturation 99% on room air.  GENERAL:  She is a pleasant individual.  HEENT:  Eyes:  PERRLA.  Extraocular movements intact. ENT examination was  normal with poor dentition.  NECK:  No thyromegaly, no JVD, no bruit.  LUNGS:  Clear to auscultation bilaterally, no wheezes.  CARDIOVASCULAR:  Regular rate and rhythm. No murmurs, rubs, or gallops.  ABDOMEN:  Soft. Bowel sounds, nontender, nondistended.  EXTREMITIES:  2+ pedal edema, pitting. No arrhythmia. No cords were palpable  on the extremities.  RECTAL:  Guaiac negative.  SKIN:  No rash. No lymphadenopathy.  NEUROLOGIC:  Decreased sensation to pinprick and lower extremities, right  more than the left.   Her sodium was 140, potassium 4.0, chloride 111, bicarb 18, BUN 48,  creatinine 0.3. serum glucose is 97, white count shows a hemoglobin of 9.8,  hematocrit of 29.4, and platelets of 353. __________ MCV of 78. 7, PT 13.2,  INR 1.8, D-dimer 0.85.   Her labs on the day of discharge sodium 137, potassium 3.8, chloride 109,  bicarb 22, BUN 44, creatinine 2.7, serum glucose of 219. White count 7.7,  hemoglobin 8.4, hematocrit 25.2, and platelets of 285. T. bili 0.5, alk phos  74, SGOT 23, SGPT 17, total protein 4.4, albumin 4.9.   HOSPITAL COURSE:  Problem #1.  Lower extremity pain. The patient not very  active and had an elevated D. dimer. Was admitted for rule  out DVT with  observational status. The patient was put on a renal dose of Lovenox and  lower extremity Dopplers were checked the day after admission, which were  negative for DVT. Secondary to her Dopplers being negative it was a  technically difficult study and most likely she does not have a DVT. She was  being sent home on just a pain medication such as Tylenol to help with her  pain.  If it tends to recur, she will come back and we will treat her as an  outpatient. At this point, I cannot give her her NSAID as her renal function  is 2.7 creatinine.   Problem #2. Renal insufficiency. The patient has a creatinine of 2.7. On  initial evaluations he had a creatinine of 3.0. We will hydrate her slightly  with fluids and her creatinine is down to 2.7 today, baseline. She was sent  home and will be followed up in the future as an outpatient.   Problem #3.  Hypertension. The patient came in with a high blood pressure.  Was started on her hydrochlorothiazide as well as started on Lopressor. On  the day of discharge her blood pressure seemed to be improved. In the future  as an outpatient if her creatinine can tolerate it she can be tried maybe on  an ACE inhibitor, but this will be left up to the primary care physician.  This will help her with her kidneys throwing out protein.   Problem #4.  Candida. She has Candida on her left side abdominal wall. We  will give her Mycolog II cream to apply to the abdomen twice a day.   Problem #5.  Tenia pedis. We will continue to treat with clotrimazole  topical treatment b.i.d.        Adonis Housekeeper, M.D.                         Madaline Guthrie, M.D.    TS/MEDQ  D:  02/27/2003  T:  02/28/2003  Job:  [id number]   cc:   Dante Gang, M.D.  Int. Med - Resident - [facility]  [place]  [place] [address]  Fax: [phone]    cc:   Dante Gang, M.D.  Int.  Med - Resident - [facility]  [place] [address]  Fax: [phone]

## 2011-01-23 NOTE — H&P (Signed)
[place]. [facility]  Patient:    [name], [name] Visit Number: [id number] MRN: [id number]          Service Type: DSU Location: 3000 3011 01 Attending Physician:  Colon Branch Dictated by:   Clydene Fake, M.D. Admit Date:  05/26/2001                           History and Physical  CHIEF COMPLAINT:  Right leg pain.  HISTORY OF PRESENT ILLNESS:  The patient is a 45 year old woman with two to three month history of back and right leg pain and numbness that has been worsening over this period of time.  MRI was done showing a herniated disk on the right side at L4-L5 with degenerative changes throughout the lumbar spine. The patient is brought in for a diskectomy.  PAST MEDICAL HISTORY:  Significant for diabetes.  PAST SURGICAL HISTORY:  C section in 2000, bilateral eye surgery in 2001.  MEDICATIONS:  Lantus insulin 58 units at night and regular insulin sliding scale during the day.  ALLERGIES:  No known drug allergies.  SOCIAL HISTORY:  She is married.  She is employed as a Investment banker, operational. Does not smoke or drink alcohol.  REVIEW OF SYSTEMS:  Otherwise negative.  FAMILY HISTORY:  Noncontributory.  PHYSICAL EXAMINATION:  GENERAL:  The patient is pleasant and in some mild distress.  VITAL SIGNS:  Weight 216, height 5 feet 10 inches.  Blood pressure 115/103, pulse 84, temperature 98.  HEENT:  Unremarkable.  NECK:  Supple.  LUNGS:  Clear.  HEART:  Regular rate and rhythm.  ABDOMEN:  Obese but nontender.  EXTREMITIES:  Intact, no edema.  BACK AND NEUROLOGIC:  Range of motion of back done fairly well in all directions, though right forward bending brings on radicular symptoms. Positive straight leg raise on the right, negative on the left.  Lower extremities show 5/5 strength in all muscle groups except for right dorsiflexion which is 5-/5 in strength and right extensor hallucis longus 4/5 in strength.  Sensation is slightly  decreased in light touch in right L5 distribution, intact elsewhere.  Pinprick is diminished in the feet bilaterally.  Deep tendon reflexes diminished at the ankles bilaterally.  ASSESSMENT/PLAN:  The patient has a right L5 radiculopathy and herniated disk. The patient is being admitted for diskectomy. Dictated by:   Clydene Fake, M.D. Attending Physician:  Colon Branch DD:  05/26/01 TD:  05/26/01 Job: [id number]

## 2011-02-11 ENCOUNTER — Inpatient Hospital Stay (HOSPITAL_COMMUNITY)
Admission: AD | Admit: 2011-02-11 | Discharge: 2011-02-18 | DRG: 603 | Disposition: A | Discharge status: Home Health | Payer: Medicare Other | Source: Ambulatory Visit | Attending: Nephrology | Admitting: Nephrology

## 2011-02-11 DIAGNOSIS — IMO0002 Reserved for concepts with insufficient information to code with codable children: Secondary | ICD-10-CM

## 2011-02-11 DIAGNOSIS — S98139A Complete traumatic amputation of one unspecified lesser toe, initial encounter: Secondary | ICD-10-CM

## 2011-02-11 DIAGNOSIS — N058 Unspecified nephritic syndrome with other morphologic changes: Secondary | ICD-10-CM | POA: Diagnosis present

## 2011-02-11 DIAGNOSIS — E1129 Type 2 diabetes mellitus with other diabetic kidney complication: Secondary | ICD-10-CM | POA: Diagnosis present

## 2011-02-11 DIAGNOSIS — Z79899 Other long term (current) drug therapy: Secondary | ICD-10-CM

## 2011-02-11 DIAGNOSIS — Z940 Kidney transplant status: Secondary | ICD-10-CM

## 2011-02-11 DIAGNOSIS — E1142 Type 2 diabetes mellitus with diabetic polyneuropathy: Secondary | ICD-10-CM | POA: Diagnosis present

## 2011-02-11 DIAGNOSIS — L02419 Cutaneous abscess of limb, unspecified: Principal | ICD-10-CM | POA: Diagnosis present

## 2011-02-11 DIAGNOSIS — Z22322 Carrier or suspected carrier of Methicillin resistant Staphylococcus aureus: Secondary | ICD-10-CM

## 2011-02-11 DIAGNOSIS — I739 Peripheral vascular disease, unspecified: Secondary | ICD-10-CM | POA: Diagnosis present

## 2011-02-11 DIAGNOSIS — I10 Essential (primary) hypertension: Secondary | ICD-10-CM | POA: Diagnosis present

## 2011-02-11 DIAGNOSIS — F341 Dysthymic disorder: Secondary | ICD-10-CM | POA: Diagnosis present

## 2011-02-11 DIAGNOSIS — E11319 Type 2 diabetes mellitus with unspecified diabetic retinopathy without macular edema: Secondary | ICD-10-CM | POA: Diagnosis present

## 2011-02-11 DIAGNOSIS — E1169 Type 2 diabetes mellitus with other specified complication: Secondary | ICD-10-CM | POA: Diagnosis present

## 2011-02-11 DIAGNOSIS — E1139 Type 2 diabetes mellitus with other diabetic ophthalmic complication: Secondary | ICD-10-CM | POA: Diagnosis present

## 2011-02-11 DIAGNOSIS — E039 Hypothyroidism, unspecified: Secondary | ICD-10-CM | POA: Diagnosis present

## 2011-02-11 DIAGNOSIS — E785 Hyperlipidemia, unspecified: Secondary | ICD-10-CM | POA: Diagnosis present

## 2011-02-11 DIAGNOSIS — S98919A Complete traumatic amputation of unspecified foot, level unspecified, initial encounter: Secondary | ICD-10-CM

## 2011-02-11 DIAGNOSIS — S98119A Complete traumatic amputation of unspecified great toe, initial encounter: Secondary | ICD-10-CM

## 2011-02-11 DIAGNOSIS — Z794 Long term (current) use of insulin: Secondary | ICD-10-CM

## 2011-02-11 DIAGNOSIS — L97909 Non-pressure chronic ulcer of unspecified part of unspecified lower leg with unspecified severity: Secondary | ICD-10-CM | POA: Diagnosis present

## 2011-02-11 DIAGNOSIS — E1149 Type 2 diabetes mellitus with other diabetic neurological complication: Secondary | ICD-10-CM | POA: Diagnosis present

## 2011-02-11 DIAGNOSIS — D638 Anemia in other chronic diseases classified elsewhere: Secondary | ICD-10-CM | POA: Diagnosis present

## 2011-02-11 DIAGNOSIS — E669 Obesity, unspecified: Secondary | ICD-10-CM | POA: Diagnosis present

## 2011-02-11 LAB — COMPREHENSIVE METABOLIC PANEL
AST: 17 U/L (ref 0–37)
Albumin: 3.4 g/dL — ABNORMAL LOW (ref 3.5–5.2)
Alkaline Phosphatase: 131 U/L — ABNORMAL HIGH (ref 39–117)
BUN: 23 mg/dL (ref 6–23)
Chloride: 102 mEq/L (ref 96–112)
Potassium: 5 mEq/L (ref 3.5–5.1)
Total Bilirubin: 0.2 mg/dL — ABNORMAL LOW (ref 0.3–1.2)

## 2011-02-11 LAB — DIFFERENTIAL
Basophils Absolute: 0 10*3/uL (ref 0.0–0.1)
Basophils Relative: 0 % (ref 0–1)
Lymphocytes Relative: 9 % — ABNORMAL LOW (ref 12–46)
Monocytes Absolute: 0.7 10*3/uL (ref 0.1–1.0)
Monocytes Relative: 10 % (ref 3–12)
Neutro Abs: 5.7 10*3/uL (ref 1.7–7.7)
Neutrophils Relative %: 80 % — ABNORMAL HIGH (ref 43–77)

## 2011-02-11 LAB — CBC
Hemoglobin: 13 g/dL (ref 12.0–15.0)
MCV: 87.5 fL (ref 78.0–100.0)
Platelets: 199 10*3/uL (ref 150–400)
RBC: 4.65 MIL/uL (ref 3.87–5.11)
WBC: 7.1 10*3/uL (ref 4.0–10.5)

## 2011-02-12 LAB — RENAL FUNCTION PANEL
BUN: 21 mg/dL (ref 6–23)
Calcium: 9.7 mg/dL (ref 8.4–10.5)
Glucose, Bld: 97 mg/dL (ref 70–99)
Phosphorus: 3.4 mg/dL (ref 2.3–4.6)

## 2011-02-12 LAB — GLUCOSE, CAPILLARY: Glucose-Capillary: 191 mg/dL — ABNORMAL HIGH (ref 70–99)

## 2011-02-12 LAB — CBC
HCT: 43.7 % (ref 36.0–46.0)
MCHC: 31.1 g/dL (ref 30.0–36.0)
MCV: 89.2 fL (ref 78.0–100.0)
RDW: 14 % (ref 11.5–15.5)

## 2011-02-13 DIAGNOSIS — L97909 Non-pressure chronic ulcer of unspecified part of unspecified lower leg with unspecified severity: Secondary | ICD-10-CM

## 2011-02-13 LAB — GLUCOSE, CAPILLARY
Glucose-Capillary: 189 mg/dL — ABNORMAL HIGH (ref 70–99)
Glucose-Capillary: 53 mg/dL — ABNORMAL LOW (ref 70–99)
Glucose-Capillary: 260 mg/dL — ABNORMAL HIGH (ref 70–99)

## 2011-02-14 DIAGNOSIS — L97209 Non-pressure chronic ulcer of unspecified calf with unspecified severity: Secondary | ICD-10-CM

## 2011-02-14 LAB — CBC
HCT: 43.6 % (ref 36.0–46.0)
Hemoglobin: 14.2 g/dL (ref 12.0–15.0)
MCHC: 32.6 g/dL (ref 30.0–36.0)
RBC: 4.97 MIL/uL (ref 3.87–5.11)

## 2011-02-14 LAB — BASIC METABOLIC PANEL
BUN: 22 mg/dL (ref 6–23)
CO2: 21 mEq/L (ref 19–32)
GFR calc non Af Amer: 46 mL/min — ABNORMAL LOW (ref >60)
Glucose, Bld: 168 mg/dL — ABNORMAL HIGH (ref 70–99)
Potassium: 4.7 mEq/L (ref 3.5–5.1)

## 2011-02-15 LAB — GLUCOSE, CAPILLARY
Glucose-Capillary: 50 mg/dL — ABNORMAL LOW (ref 70–99)
Glucose-Capillary: 78 mg/dL (ref 70–99)
Glucose-Capillary: 223 mg/dL — ABNORMAL HIGH (ref 70–99)
Glucose-Capillary: 332 mg/dL — ABNORMAL HIGH (ref 70–99)
Glucose-Capillary: 224 mg/dL — ABNORMAL HIGH (ref 70–99)
Glucose-Capillary: 200 mg/dL — ABNORMAL HIGH (ref 70–99)
Glucose-Capillary: 279 mg/dL — ABNORMAL HIGH (ref 70–99)
Glucose-Capillary: 384 mg/dL — ABNORMAL HIGH (ref 70–99)

## 2011-02-16 LAB — CBC
HCT: 40.6 % (ref 36.0–46.0)
MCH: 27.9 pg (ref 26.0–34.0)
MCHC: 31.8 g/dL (ref 30.0–36.0)
RDW: 13.9 % (ref 11.5–15.5)

## 2011-02-16 LAB — GLUCOSE, CAPILLARY
Glucose-Capillary: 115 mg/dL — ABNORMAL HIGH (ref 70–99)
Glucose-Capillary: 173 mg/dL — ABNORMAL HIGH (ref 70–99)

## 2011-02-16 LAB — RENAL FUNCTION PANEL
Albumin: 3.1 g/dL — ABNORMAL LOW (ref 3.5–5.2)
BUN: 25 mg/dL — ABNORMAL HIGH (ref 6–23)
Calcium: 10.2 mg/dL (ref 8.4–10.5)
Creatinine, Ser: 1.13 mg/dL (ref 0.4–1.2)
Glucose, Bld: 138 mg/dL — ABNORMAL HIGH (ref 70–99)
Phosphorus: 3.1 mg/dL (ref 2.3–4.6)
Potassium: 4.2 mEq/L (ref 3.5–5.1)

## 2011-02-17 LAB — GLUCOSE, CAPILLARY
Glucose-Capillary: 323 mg/dL — ABNORMAL HIGH (ref 70–99)
Glucose-Capillary: 184 mg/dL — ABNORMAL HIGH (ref 70–99)

## 2011-02-17 LAB — RENAL FUNCTION PANEL
Albumin: 3.2 g/dL — ABNORMAL LOW (ref 3.5–5.2)
CO2: 26 mEq/L (ref 19–32)
Chloride: 101 mEq/L (ref 96–112)
Creatinine, Ser: 1.28 mg/dL — ABNORMAL HIGH (ref 0.4–1.2)
GFR calc Af Amer: 55 mL/min — ABNORMAL LOW (ref >60)
GFR calc non Af Amer: 46 mL/min — ABNORMAL LOW (ref >60)
Potassium: 4.4 mEq/L (ref 3.5–5.1)
Sodium: 135 mEq/L (ref 135–145)

## 2011-02-17 LAB — CBC
Platelets: 174 10*3/uL (ref 150–400)
RBC: 4.79 MIL/uL (ref 3.87–5.11)
RDW: 13.7 % (ref 11.5–15.5)
WBC: 6.1 10*3/uL (ref 4.0–10.5)

## 2011-02-18 LAB — RENAL FUNCTION PANEL
BUN: 28 mg/dL — ABNORMAL HIGH (ref 6–23)
Chloride: 104 mEq/L (ref 96–112)
Creatinine, Ser: 1.15 mg/dL (ref 0.4–1.2)
Glucose, Bld: 109 mg/dL — ABNORMAL HIGH (ref 70–99)
Phosphorus: 3.1 mg/dL (ref 2.3–4.6)
Potassium: 4.5 mEq/L (ref 3.5–5.1)

## 2011-02-18 LAB — GLUCOSE, CAPILLARY: Glucose-Capillary: 267 mg/dL — ABNORMAL HIGH (ref 70–99)

## 2011-02-18 LAB — CULTURE, BLOOD (ROUTINE X 2)
Culture  Setup Time: 201206070153
Culture: NO GROWTH

## 2011-02-18 LAB — CBC
HCT: 42.7 % (ref 36.0–46.0)
Hemoglobin: 13.6 g/dL (ref 12.0–15.0)
MCHC: 31.9 g/dL (ref 30.0–36.0)
MCV: 87.1 fL (ref 78.0–100.0)
RDW: 13.7 % (ref 11.5–15.5)

## 2011-02-19 LAB — GLUCOSE, CAPILLARY: Glucose-Capillary: 101 mg/dL — ABNORMAL HIGH (ref 70–99)

## 2011-02-20 NOTE — H&P (Signed)
NAMEJAMESIA, LINNEN              ACCOUNT NO.:  [id number]  MEDICAL RECORD NO.:  [id number]  LOCATION:  6741                         FACILITY:  [facility]  PHYSICIAN:  Terrial Rhodes, M.D.DATE OF BIRTH:  Apr 12, 1966  DATE OF ADMISSION:  02/11/2011 DATE OF DISCHARGE:                             HISTORY & PHYSICAL   ADMISSION DIAGNOSES: 1. Diabetic ulcer. 2. 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 3. Insulin-dependent diabetes mellitus.  HISTORY OF PRESENT ILLNESS:  Ms. 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  She reports this has been going on over the last week.  She denies any fevers, chills, nausea, or vomiting.  Her allergies are LISINOPRIL.  PAST MEDICAL HISTORY: 1. End-stage renal disease status post cadaveric kidney transplant on     December 16, 2009. 2. Insulin-dependent diabetes mellitus, poorly-controlled complicated     by retinopathy, neuropathy, and nephropathy. 3. Hypertension. 4. Obesity. 5. History of enterococcal late ulcer April 2012. 6. Hypothyroidism. 7. Anemia of chronic disease. 8. Peripheral vascular disease. 9. Hyperlipidemia. 10.Anxiety and depression.  OUTPATIENT MEDICATIONS: 1. Norvasc 5 mg a day. 2. Prograf 1 mg b.i.d. 3. CellCept 500 mg b.i.d. 4. Prednisone 5 mg a day. 5. Bactrim double strength one every Monday, Wednesday, and  Friday. 6. Lantus 40 units subcu b.i.d. 7. NovoLog sliding scale. 8. Synthroid 137 mcg a day. 9. Zoloft 25 mg a day. 10.Coreg 50 mg b.i.d.  SOCIAL HISTORY:  She lives in [place] with her husband.  She has one son.  No tobacco, no alcohol, no drug use.  She is not employed.  FAMILY HISTORY:  Noncontributory.  REVIEW OF SYSTEMS:  IN GENERAL:  Denies any anorexia or malaise. OPHTHALMIC:  No blurred vision, photophobia.  ENT:  No tinnitus, dysphagia, odynophagia.  CARDIAC:  No chest pain, palpitations, orthopnea, PND.  PULMONARY:  No shortness of breath, hemoptysis, productive cough.  GI:  No nausea, vomiting, hematochezia, melena, red blood per rectum.  GU:  No dysuria, pyuria, hematuria, urgency, frequency.  RHEUMATOLOGIC:  She has noticed increasing redness, foul smell, and increasing pus draining from her left leg ulcer.  All other systems are negative.  PHYSICAL EXAMINATION:  GENERAL:  A well-developed obese female in no apparent distress. VITAL SIGNS:  Pulse 84, blood pressure 120/60, respiratory rate is 18, weight of 251 pounds. HEENT:  Normocephalic, atraumatic.  Extraocular muscles intact.  No icterus.  Oropharynx without lesions. NECK:  Supple.  No lymphadenopathy or bruits. LUNGS:  Clear to auscultation and percussion bilaterally.  No rales or rhonchi. CARDIAC:  Regular rate and rhythm.  No precordial rub appreciated. ABDOMEN:  Normoactive bowel sounds, soft, nontender, nondistended.  No guarding, no rebound, no  bruits. EXTREMITIES:  Her left leg has a 4 cm x 8 cm ulcerated lesion with a purulent drainage, foul smell surrounding erythema.  She has a right forearm AV graft with palpable thrill and audible bruit.  She has trace pretibial edema.  Labs are pending.  ASSESSMENT AND PLAN: 1. Worsening cellulitis/ulcer, diabetic with a history of enterococcal     cellulitis and abscess.  Plan to admit for IV vancomycin and Zosyn     and Wound Care. 2. End-stage renal  disease status post cadaveric kidney transplant.     Continue with anti-rejection medicines and follow renal function. 3. Diabetes mellitus, poorly controlled.  Continue with Lantus and     NovoLog sliding scale. 4. Hypertension, stable.  Continue with meds. 5. Immunosuppression.  Continue with Prograf, CellCept, prednisone. 6. Hypothyroidism on Synthroid. 7. Depression on Zoloft.          ______________________________ Terrial Rhodes, M.D.     JC/MEDQ  D:  02/11/2011  T:  02/12/2011  Job:  [id number]  Electronically Signed by Terrial Rhodes M.D. on 02/20/2011 12:52:12 PM

## 2011-02-24 NOTE — Consult Note (Signed)
[name], [name]              ACCOUNT NO.:  [id number]  MEDICAL RECORD NO.:  [id number]  LOCATION:  6741                         FACILITY:  [facility]  PHYSICIAN:  [name], M.D.    DATE OF BIRTH:  1966-08-31  DATE OF CONSULTATION: DATE OF DISCHARGE:                                CONSULTATION   DIAGNOSIS:  Nonhealing diabetic ulcer, left lower extremity.  HISTORY OF PRESENT ILLNESS:  This is a 45 year old white female with past medical history significant for end-stage renal disease secondary to diabetes and is status post cadaveric kidney transplant on December 16, 2009.  She has had good renal function, has had multiple infections with the most recent being April of this year with left pretibial ulcer with enterococcal infection, cellulitis, and was treated with operation and wound VAC after surgery.  She presented 2 days ago for followup and was found to have increasing purulent drainage and a foul odor of her wound which she states has been getting worse for over a week.  She denies any fever, chills, nausea, or vomiting.  The patient states her wound has been 8 x 4 since the debridement with no change in size, home health and wound care nurse has been coming and evaluating her wound since then.  PAST MEDICAL/PAST SURGICAL HISTORY: 1. Nonhealing left lower extremity diabetic ulcer, status post full-     thick eschar excision and wound VAC placement on December 28, 2010.     a.     Infection with enterococcus. 2. End-stage renal disease status post kidney transplant April 2011.     a.     Status post multiple access surgeries. 3. Insulin dependent diabetes with retinopathy, nephropathy, and     neuropathy. 4. History of right foot second ray resection. 5. Status post right second and third toe amputations June of 2006. 6. 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 7. 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 8. Debridement and drainage of left foot. 9.  Retinal surgery, left eye, December 2001. 10.Bilateral cataract surgery. 11.Hypertension. 12.Obesity. 13.Hypothyroidism. 14.Anemia of chronic disease. 15.Peripheral vascular disease. 16.Hyperlipidemia. 17.Anxiety/depression. 18.Positive MRSA swab, this admission.  ALLERGIES:  LISINOPRIL which causes cough, ADHESIVES causes itching.  HOME MEDICATIONS: 1. Norvasc 5 mg p.o. daily. 2. Prograf 1 mg p.o. b.i.d. 3. CellCept 500 mg p.o. b.i.d. 4. Prednisone 5 mg p.o. daily. 5. Bactrim DS Monday, Wednesday, and Friday. 6. Lantus 40 units b.i.d. 7. NovoLog sliding scale. 8. Synthroid 137 mcg p.o. daily. 9. Zoloft 25 mg p.o. daily. 10.Coreg 50 mg p.o. b.i.d.  HOSPITAL ANTIBIOTICS AND MEDICATIONS ADDED:  Vancomycin, Zosyn, Lovenox.  SOCIAL HISTORY:  She is married and lives in [place].  She has one son who is 14 years old.  She denies any EtOH, tobacco, or illicit drug use.  FAMILY HISTORY:  Her mother died at age 67 with COPD and throat cancer. Her father died at age 64, major CVA.  REVIEW OF SYSTEMS:  GENERAL:  She denies any fever, chills, nausea, and vomiting.  She states she has had a 12-pound weight loss since April, this is unintentional.  HEENT:  Positive for history of cataract surgery.  She  states that she has a coated film right before her eyes that needs to be surgically removed.  She denies any sinusitis, allergies, or inner ear problems.  NEUROLOGIC:  She denies any history of CVA or seizures.  She states she has a right hand tremor occasionally approximately one time per day.  CARDIAC:  Denies any chest pain or MI. PULMONARY:  She does have dyspnea on exertion when walking more distance.  History of remote pneumonia.  She denies hemoptysis. GASTROINTESTINAL:  Positive for diarrhea approximately 30 minutes after each meal.  No peptic ulcer disease.  No melena.  GENITOURINARY:  She denies any dysuria or hematuria but is positive for urgency.  ENDOCRINE: Positive for  diabetic retinopathy, neuropathy, and nephropathy and positive for hypothyroidism.  MUSCULOSKELETAL:  Positive for arthritis in her elbows, legs, and back.  She does have a ganglion cyst on her right wrist.  PSYCHIATRIC:  Positive for anxiety and depression.  PHYSICAL EXAMINATION:  VITAL SIGNS:  Blood pressure is 114/67, heart rate 60s to 70s.  She is afebrile and 94% on room air. GENERAL:  She is in no acute distress. HEENT:  Normocephalic.  Her pupils are equal, round, and reactive to light. NEUROLOGIC:  There are no focal defects. HEART:  Regular rate and rhythm. LUNGS:  Clear to auscultation bilaterally. ABDOMEN:  Soft, nontender, nondistended with positive bowel sounds. EXTREMITIES:  There is a pretibial wound that is 8 x 4.  There is no purulence or foul odor at this time.  She has chronic venous stasis. She has a faint positive dorsalis pedal pulse bilaterally as well as functioning right forearm graft.  LABORATORY DATA:  As of February 12, 2011, sodium 138, potassium 4.5, BUN and creatinine are 21 and 1.2 respectively.  White blood cell count 6.4, hemoglobin 13.6, hematocrit 43.7, platelet count 203.  Blood culturesare no growth to date at this time.  Wound culture was taken in Dr. Hadley Pen office 2 days ago.  ABIs 2.39 bilaterally, but this is most likely falsely elevated due to her diabetes.  ASSESSMENT:  This is a 45 year old white female with nonhealing left lower extremity wound.  Has insulin-dependent diabetes and end-stage renal disease and status post kidney transplant.  PLAN:  Questionable need for arteriogram or CTA considering kidney transplant patient, I will discuss this with Dr. Arbie Cookey.     [name] Pigg, PA   ______________________________ [name], M.D.    SE/MEDQ  D:  02/13/2011  T:  02/14/2011  Job:  [id number]  Electronically Signed by [name] Pigg PA on 02/17/2011 09:52:27 AM Electronically Signed by TODD EARLY M.D. on 02/24/2011  01:12:05 PM

## 2011-03-22 IMAGING — CR DG CHEST 1V PORT
1 series · 1 of 1 positions shown · non-contrast
Comparison: 05/20/2008 and earlier.

CLINICAL DATA: 43-year-old female with altered level of
consciousness.  Shortness of breath.  Hyperglycemia.

PORTABLE CHEST - 1 VIEW

[view not recorded]
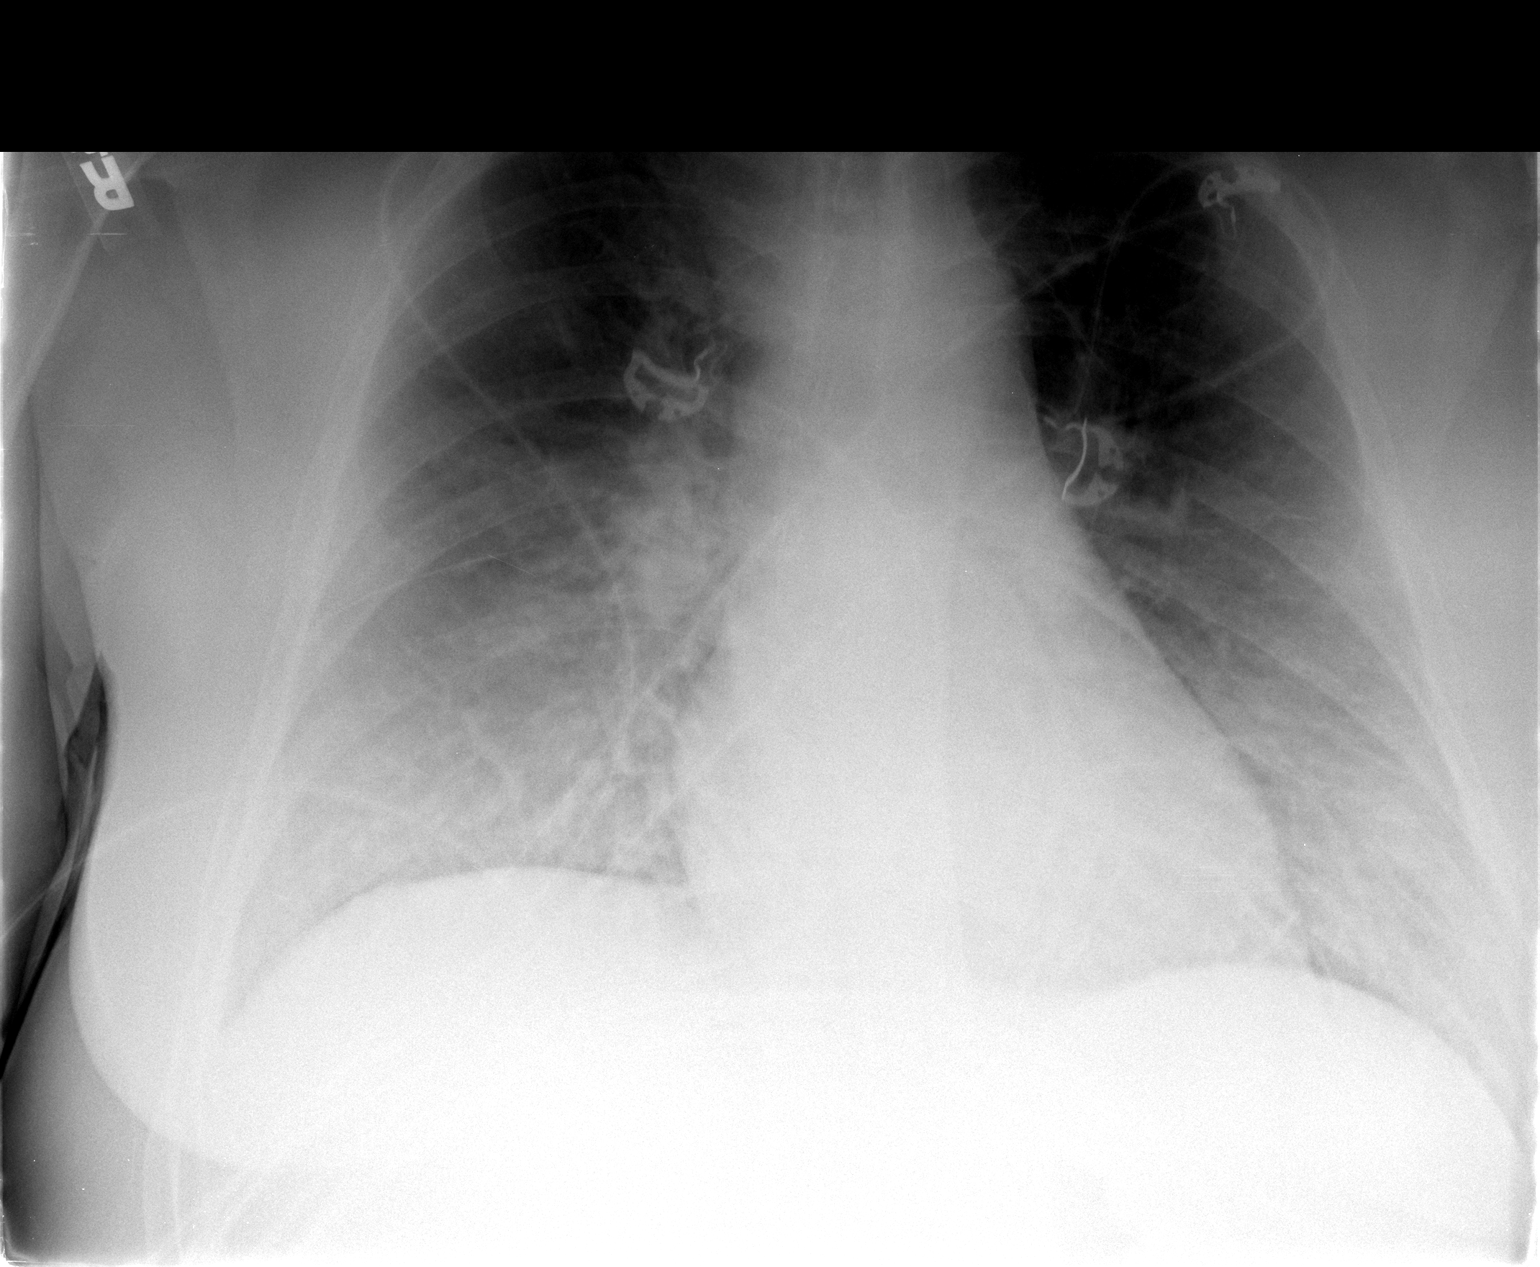

[1 of 1 positions shown; findings below may reference images not displayed]

FINDINGS: Portable semi upright AP view 3533 hours.  Stable cardiac
size and mediastinal contours.  Stable lung volumes.  No
pneumothorax, pleural effusion, consolidation, or confluent
airspace opacity.  There is basilar predominant increased
interstitial opacity.
IMPRESSION: Basilar predominant increased interstitial opacity, pulmonary
interstitial edema or acute viral / atypical pulmonary infection
are not excluded.  Otherwise stable chest.  PA and lateral views
would be helpful when possible.

## 2011-03-22 IMAGING — CR DG CHEST 1V PORT
1 series · 1 of 1 positions shown · non-contrast
Comparison: 06/03/2009 at 1911 hours.

CLINICAL DATA: Altered mental status.

PORTABLE CHEST - 1 VIEW

[view not recorded]
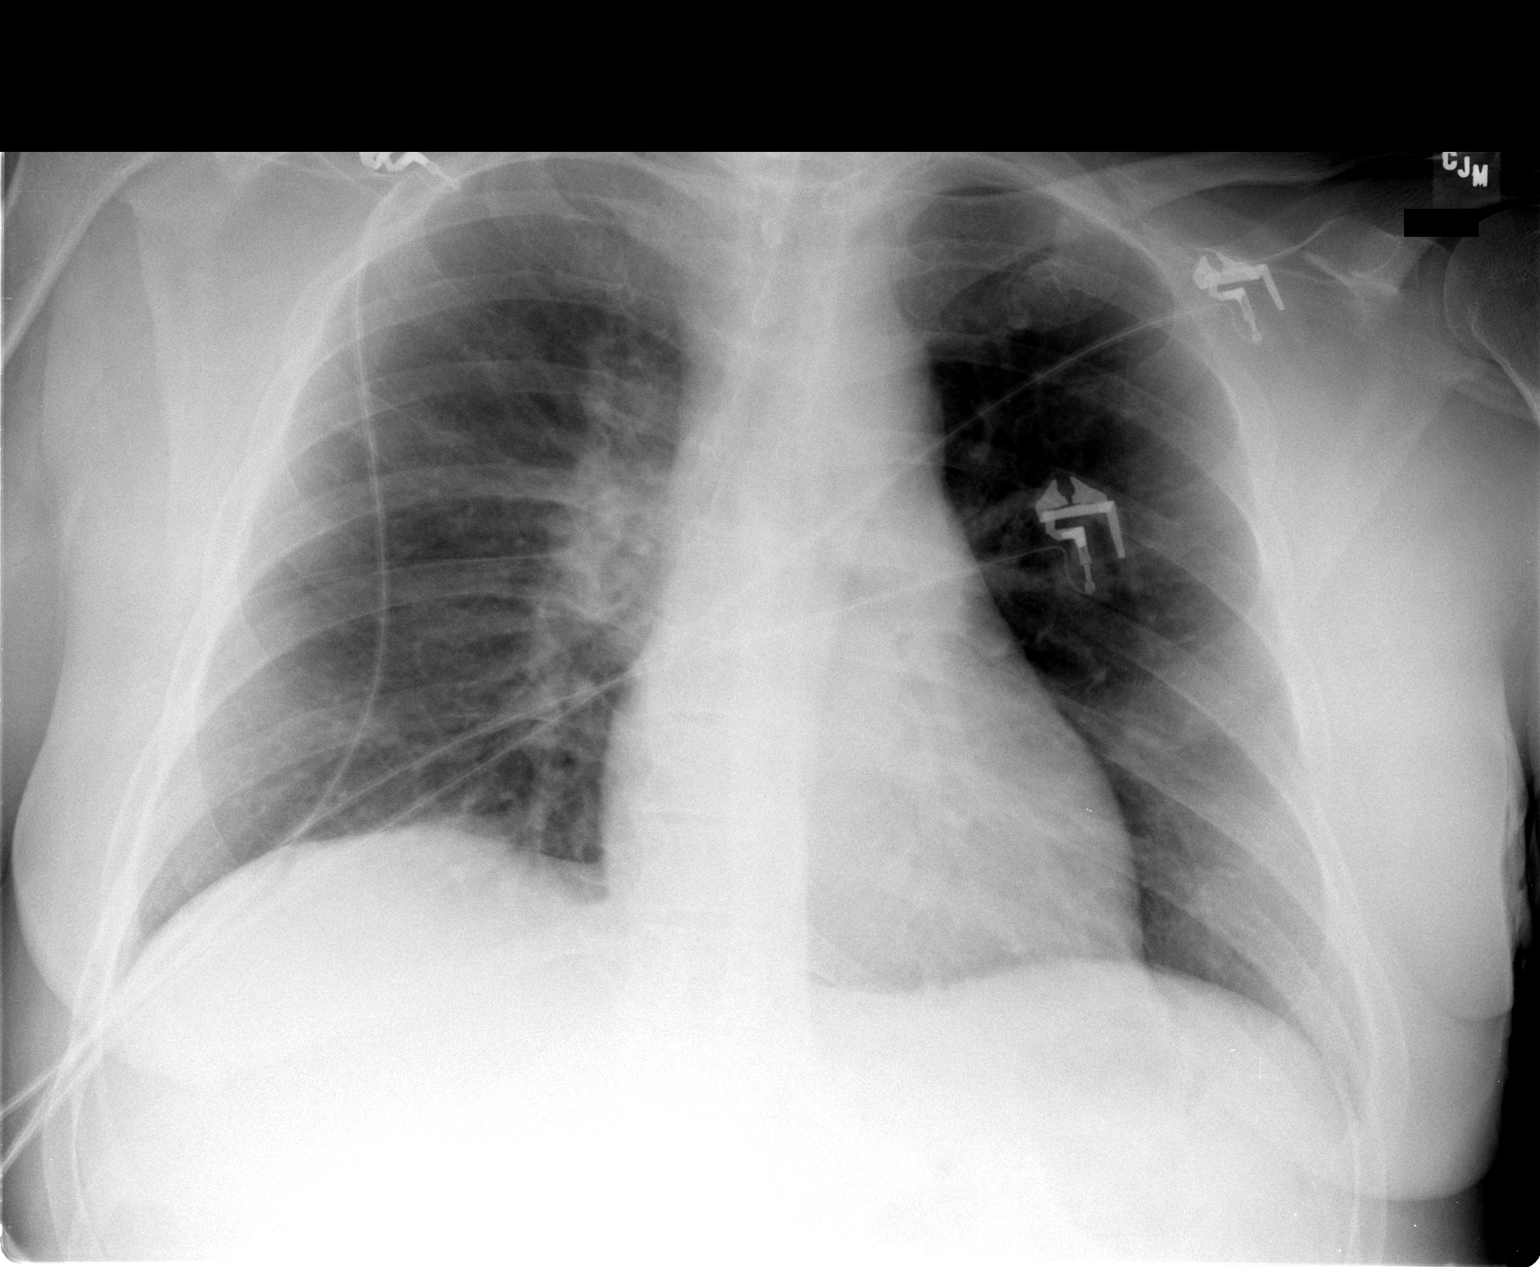

[1 of 1 positions shown; findings below may reference images not displayed]

FINDINGS: The cardiac silhouette, mediastinal and hilar contours
are within normal limits.  The lungs are clear.  No pleural
effusions.  The bony thorax is intact.
IMPRESSION: No acute cardiopulmonary findings.  Resolution of mild pulmonary
edema seen on the earlier film.

## 2011-03-23 IMAGING — CR DG CHEST 2V
2 series · 2 of 2 positions shown · non-contrast
Comparison: Chest 06/03/2009.

CLINICAL DATA: Cough, diabetic patient on dialysis.

CHEST - 2 VIEW

[w chest pa]
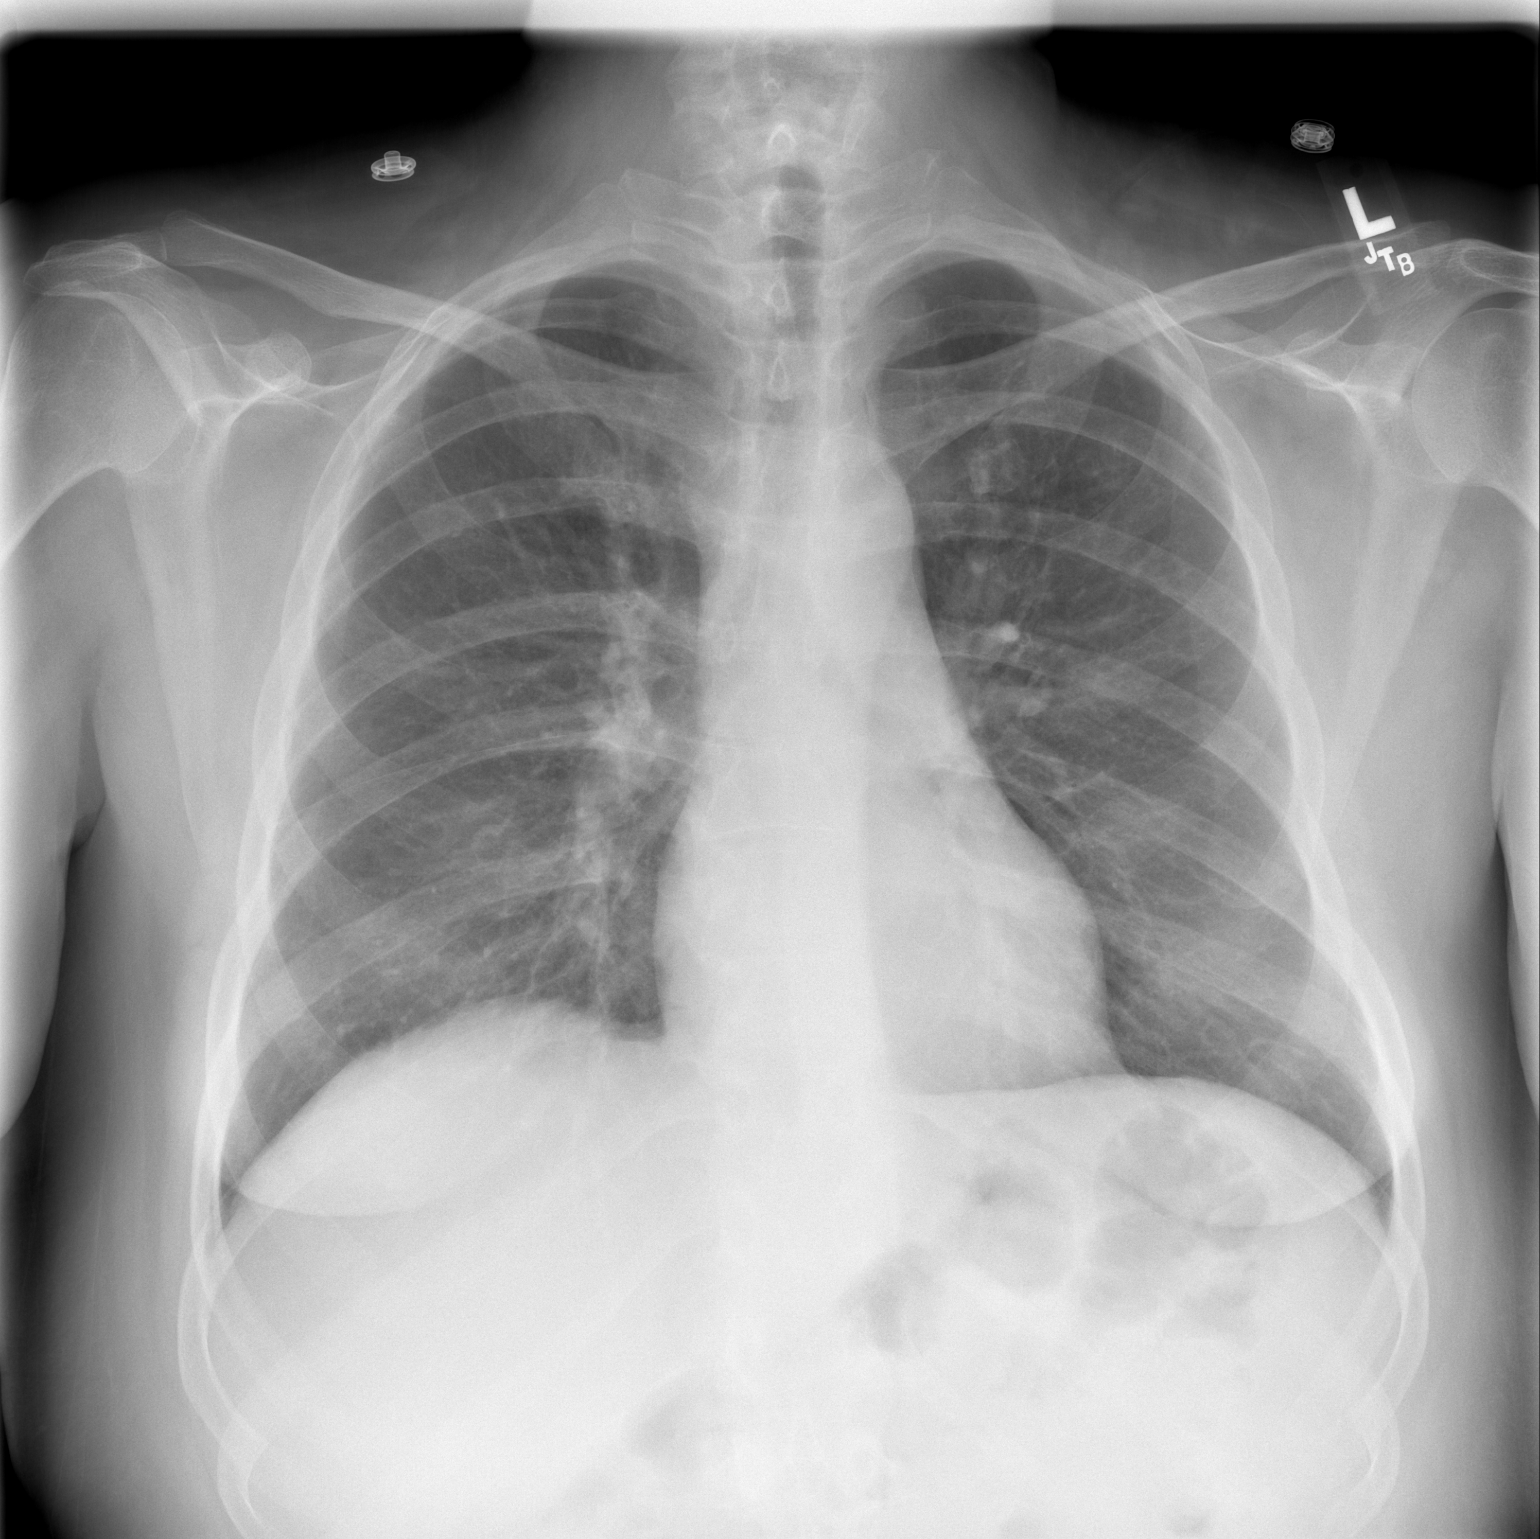

[w chest lat]
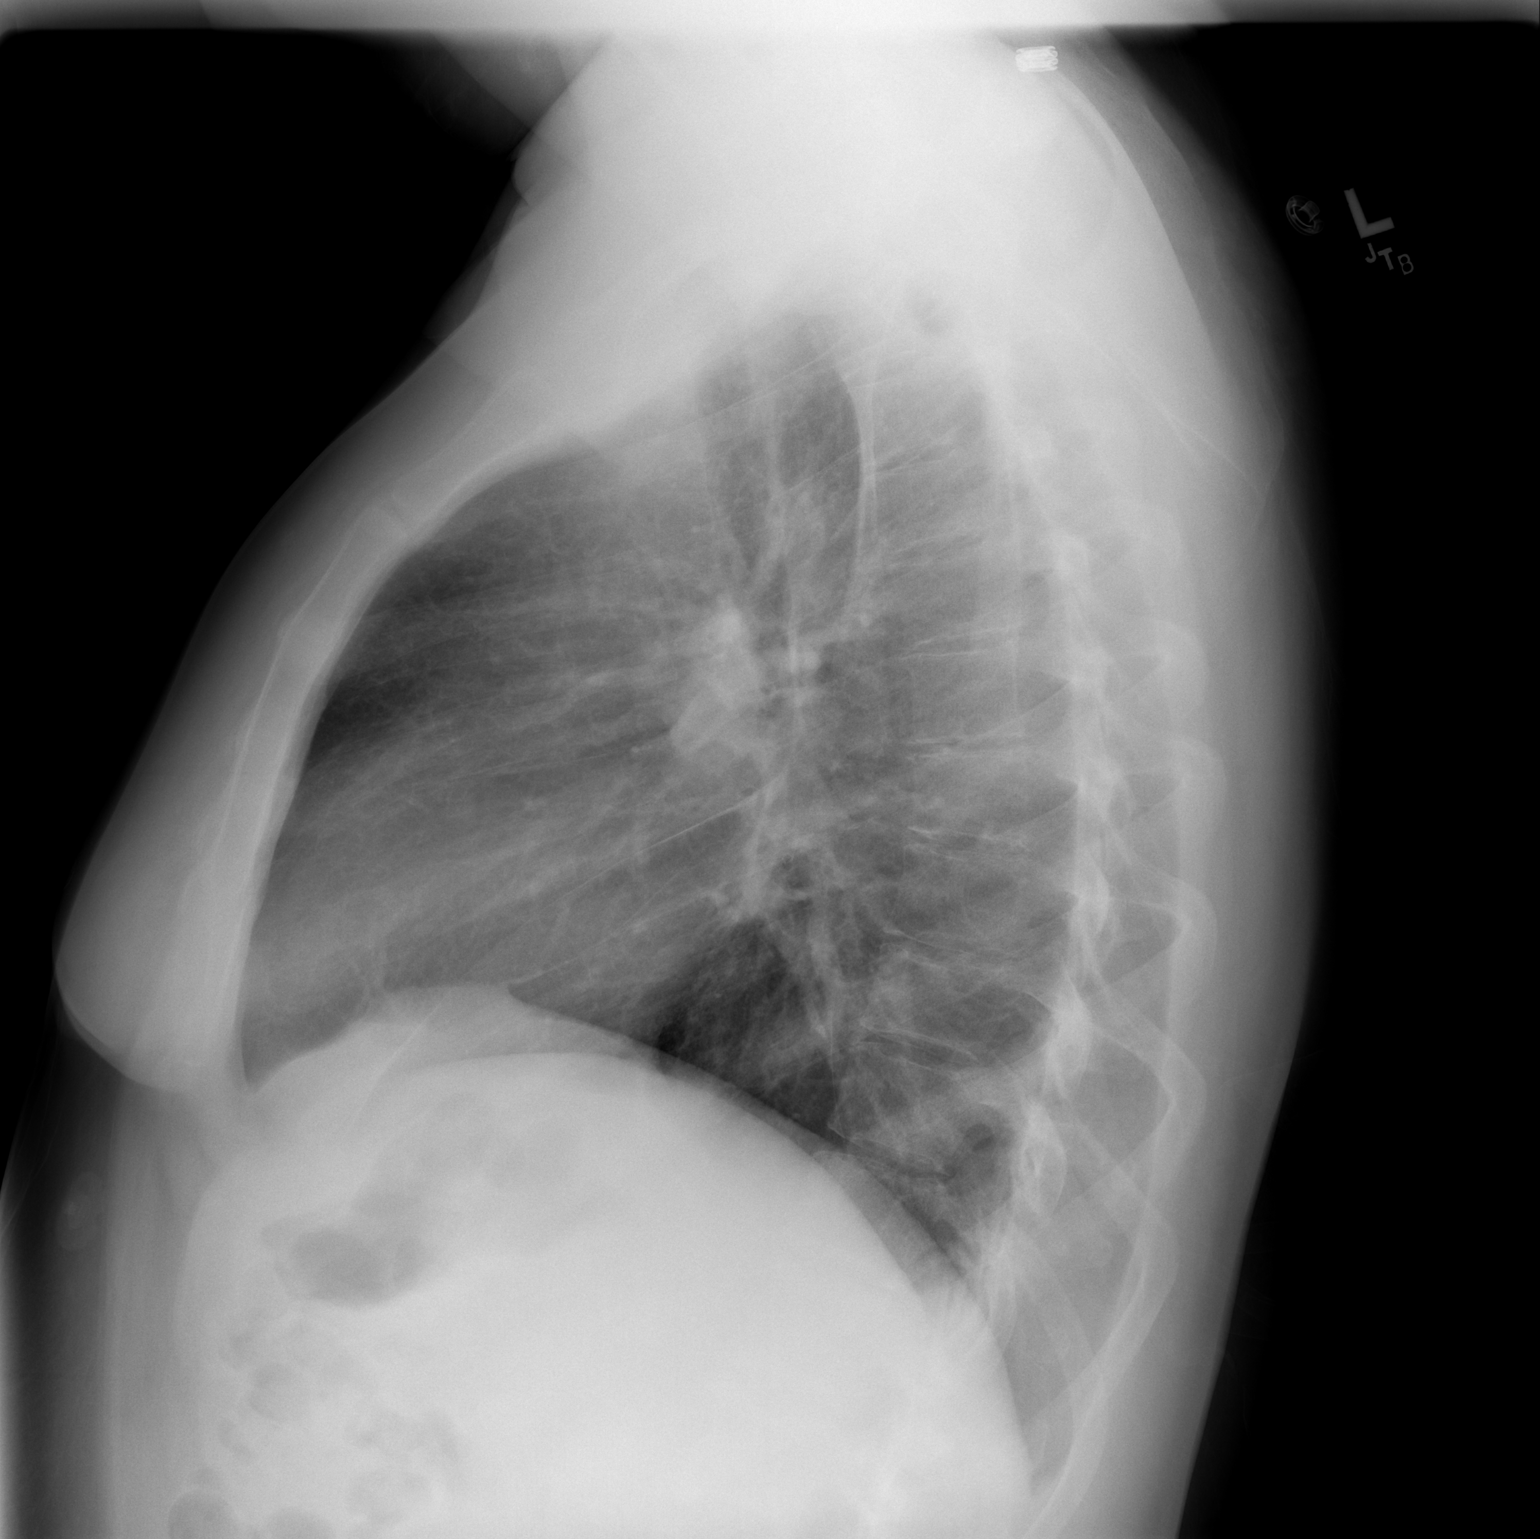

[2 of 2 positions shown; findings below may reference images not displayed]

FINDINGS: The lungs are clear.  No pleural effusion.  Heart size
normal.  No focal bony abnormality.
IMPRESSION: No acute disease.

## 2011-03-24 NOTE — Discharge Summary (Signed)
[name], [name]              ACCOUNT NO.:  [id number]  MEDICAL RECORD NO.:  [id number]  LOCATION:  6741                         FACILITY:  [facility]  PHYSICIAN:  Garnetta Buddy, M.D.   DATE OF BIRTH:  [birth year]  DATE OF ADMISSION:  02/11/2011 DATE OF DISCHARGE:  02/18/2011                              DISCHARGE SUMMARY   ADMISSION DIAGNOSES: 1. Worsening cellulitis/ulcer, left leg with history of enterococcal     cellulitis and abscess. 2. 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 3. Diabetes mellitus poorly controlled, insulin dependent. 4. Hypertension. 5. Immunosuppression. 6. Hypothyroidism. 7. Depression.  DISCHARGE DIAGNOSES: 1. Cellulitis, nonhealing left lower extremity diabetic ulcer, status     post full-thickness scar excision and wound VAC, previously December 28, 2010, blood cultures negative. 2. End-stage renal disease status post kidney transplant in April     2011, stable renal function. 3. Hypertension. 4. Obesity. 5. 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 6. Hypertension. 7. Hypothyroidism. 8. Obesity.  CONSULTATIONS:  With [facility] Larina Earthly, MD, February 13, 2011.  BRIEF HISTORY AND REASON FOR ADMISSION:  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  Presented today for followup and notes to have increasing purulent drainage with foul odor emanating from ulcer in her left lower extremity dressing.  She  reports this has been going on for over a week.  She denies any chills, fever or vomiting.  PHYSICAL EXAMINATION:  By Dr. Terrial Rhodes; GENERAL:  She is a well-developed obese white female in no apparent distress. VITAL SIGNS:  Pulse is 84, blood pressure 120/60, respirations 18, weight was 251 pounds. HEENT:  Normocephalic, atraumatic.  Extraocular movements are intact. Nonicteric.  Pharynx without lesions. NECK:  Supple.  No lymphadenopathy or bruits. LUNGS:  Clear to auscultation and percussion bilaterally.  No rales.  No rhonchi. CARDIAC:  Regular rate and rhythm auscultated.  No pericardial rub appreciated.  No murmur was mentioned. ABDOMEN:  Normoactive bowel sounds, soft, nontender, nondistended.  No guarding.  No rebound.  No bruits. EXTREMITIES:  Lower extremities revealed left leg with 4 cm x 8 cm ulcerated lesion with purulent drainage, smell surrounding the erythema. She has a right forearm AV Gore-Tex graft with palpable thrill and audible bruit.  She has trace pretibial edema.  Because of worsening cellulitis and ulcer with history of enterococcus cellulitis and previous abscess requiring wound VAC, Dr. Arrie Aran admitted the patient for IV vancomycin, IV dosed therapy and also aggressive wound care consult.  [facility]VS also has seen this patient in the past.  COURSE IN THE HOSPITAL:  The patient was admitted by Dr. Arrie Aran with IV antibiotics, vancomycin and Zosyn.  Also wound care consult with wound care  nurse.  Santyl daily, light debridement, dressing changes were done.  [facility] consult done on February 13, 2011, noted by Dr. Gretta Began. On his exam, the wound was having good granulation tissue with a current IV antibiotics therapy and aggressive wound care by wound care nurse. He did not think there is any need for angiogram or other further workup for circulation at this time.  She had adequate flow to left leg by exam and by the way noted healing currently with the  current therapy.  She did have ABIs done on the right 2.39, on the left 2.39.  She does have bilateral diffuse calcifications with this study as noted.  She was deemed for discharge home per Dr. Elvis Coil on exam on February 18, 2011, further followup outpatient with home health in order to continue to seeing her as she had had before and recommendations were by the wound care nurse here.  To use Aquacel Ag and Kerlix dressing changes every other day for now.  Noted by her exam that she had increase granulation of 95% compared to noted on admission exam.  Dr. Hyman Hopes placed the patient on doxycycline 100 mg b.i.d. for 10 days total therapy.  Blood cultures obtained showed no growth at the time of this dictation.  In terms of her renal transplant, serum creatinine was stable throughout hospital stay.  With a serum creatinine of 1.15, BUN was 28 on day of discharge, potassium 4.5, CO2 26, calcium 10.5, phosphorous 3.1, albumin was 3.2.  She was continued on CellCept 250 mg 2 tablets b.i.d., Prograf 1 mg b.i.d., Bactrim Monday, Wednesday, Friday, prednisone 5 mg daily.  Hypertension, volume issues, pressure is stable.  She was on amlodipine 5 mg nightly, Coreg 325 mg b.i.d., was mostly stable blood pressure throughout hospitalization and at the time of discharge.  Hypothyroidism.  She was continued on Synthroid 137 mcg daily.  Depression.  She was continued on Zoloft 25 mg daily and she was in good spirits at the time of discharge.  MEDICATIONS AT THE TIME OF DISCHARGE: 1. Doxycycline 100 mg b.i.d. for 10 days. 2. Amlodipine 5 mg nightly. 3. Bactrim 1 tablet Monday, Wednesday, Friday. 4. Coreg 25 mg 2 tablets b.i.d. 5. Lantus insulin 40 units b.i.d. 6. CellCept 250 mg 2 tablets b.i.d. 7. NovoLog sliding scale insulin. 8. Prednisone 5 mg every morning. 9. Zantac 150 mg nightly. 10.Zoloft 25 mg q.a.m. 11.Synthroid 137 mcg every morning. 12.Prograf 1 mg b.i.d.  Diet was 2 g sodium,  carbohydrate modified.  WOUND CARE:  She will continue to have wound care by Home Health RN with dressing changes as noted per nurse at the hospital at the time of discharge.  Followup office appointment with Dr. Terrial Rhodes, his office to call for appointment time in 2-3 weeks per Dr. Elvis Coil.  She was told to call if she has any worsening pain, draining from her wound, fever, chills.     Donald Pore, P.A.   ______________________________ Garnetta Buddy, M.D.    DWZ/MEDQ  D:  02/18/2011  T:  02/19/2011  Job:  [id number]  cc:   [facility]  Electronically Signed by Lenny Pastel P.A. on 02/24/2011 02:07:33 PM Electronically Signed by Elvis Coil M.D. on 03/24/2011 05:09:19 PM

## 2011-05-15 ENCOUNTER — Emergency Department (HOSPITAL_COMMUNITY)
Admission: EM | Admit: 2011-05-15 | Discharge: 2011-05-15 | Disposition: A | Discharge status: Home / Self Care | Payer: Medicare Other | Attending: Emergency Medicine | Admitting: Emergency Medicine

## 2011-05-15 DIAGNOSIS — Z794 Long term (current) use of insulin: Secondary | ICD-10-CM | POA: Insufficient documentation

## 2011-05-15 DIAGNOSIS — E1169 Type 2 diabetes mellitus with other specified complication: Secondary | ICD-10-CM | POA: Insufficient documentation

## 2011-05-15 DIAGNOSIS — Z79899 Other long term (current) drug therapy: Secondary | ICD-10-CM | POA: Insufficient documentation

## 2011-05-15 DIAGNOSIS — E039 Hypothyroidism, unspecified: Secondary | ICD-10-CM | POA: Insufficient documentation

## 2011-05-15 DIAGNOSIS — I129 Hypertensive chronic kidney disease with stage 1 through stage 4 chronic kidney disease, or unspecified chronic kidney disease: Secondary | ICD-10-CM | POA: Insufficient documentation

## 2011-05-15 DIAGNOSIS — N189 Chronic kidney disease, unspecified: Secondary | ICD-10-CM | POA: Insufficient documentation

## 2011-05-25 LAB — GLUCOSE, CAPILLARY: Glucose-Capillary: 206 mg/dL — ABNORMAL HIGH (ref 70–99)

## 2011-05-27 ENCOUNTER — Emergency Department (HOSPITAL_COMMUNITY): Payer: Medicare Other

## 2011-05-27 ENCOUNTER — Encounter: Payer: Self-pay | Admitting: Internal Medicine

## 2011-05-27 ENCOUNTER — Observation Stay (HOSPITAL_COMMUNITY)
Admission: EM | Admit: 2011-05-27 | Discharge: 2011-05-28 | Disposition: A | Discharge status: Home / Self Care | Payer: Medicare Other | Attending: Internal Medicine | Admitting: Internal Medicine

## 2011-05-27 DIAGNOSIS — I120 Hypertensive chronic kidney disease with stage 5 chronic kidney disease or end stage renal disease: Secondary | ICD-10-CM | POA: Insufficient documentation

## 2011-05-27 DIAGNOSIS — S99919A Unspecified injury of unspecified ankle, initial encounter: Secondary | ICD-10-CM | POA: Insufficient documentation

## 2011-05-27 DIAGNOSIS — E1129 Type 2 diabetes mellitus with other diabetic kidney complication: Secondary | ICD-10-CM | POA: Insufficient documentation

## 2011-05-27 DIAGNOSIS — L03119 Cellulitis of unspecified part of limb: Secondary | ICD-10-CM | POA: Insufficient documentation

## 2011-05-27 DIAGNOSIS — S8990XA Unspecified injury of unspecified lower leg, initial encounter: Secondary | ICD-10-CM | POA: Insufficient documentation

## 2011-05-27 DIAGNOSIS — Y998 Other external cause status: Secondary | ICD-10-CM | POA: Insufficient documentation

## 2011-05-27 DIAGNOSIS — F3289 Other specified depressive episodes: Secondary | ICD-10-CM | POA: Insufficient documentation

## 2011-05-27 DIAGNOSIS — Z23 Encounter for immunization: Secondary | ICD-10-CM | POA: Insufficient documentation

## 2011-05-27 DIAGNOSIS — N186 End stage renal disease: Secondary | ICD-10-CM | POA: Insufficient documentation

## 2011-05-27 DIAGNOSIS — Y92009 Unspecified place in unspecified non-institutional (private) residence as the place of occurrence of the external cause: Secondary | ICD-10-CM | POA: Insufficient documentation

## 2011-05-27 DIAGNOSIS — F329 Major depressive disorder, single episode, unspecified: Secondary | ICD-10-CM | POA: Insufficient documentation

## 2011-05-27 DIAGNOSIS — IMO0002 Reserved for concepts with insufficient information to code with codable children: Secondary | ICD-10-CM | POA: Insufficient documentation

## 2011-05-27 DIAGNOSIS — E1165 Type 2 diabetes mellitus with hyperglycemia: Secondary | ICD-10-CM | POA: Insufficient documentation

## 2011-05-27 DIAGNOSIS — L02419 Cutaneous abscess of limb, unspecified: Principal | ICD-10-CM | POA: Insufficient documentation

## 2011-05-27 DIAGNOSIS — E039 Hypothyroidism, unspecified: Secondary | ICD-10-CM | POA: Insufficient documentation

## 2011-05-27 DIAGNOSIS — Z940 Kidney transplant status: Secondary | ICD-10-CM | POA: Insufficient documentation

## 2011-05-27 DIAGNOSIS — Z794 Long term (current) use of insulin: Secondary | ICD-10-CM | POA: Insufficient documentation

## 2011-05-27 LAB — URINALYSIS, MICROSCOPIC ONLY
Bilirubin Urine: NEGATIVE
Glucose, UA: >1000 mg/dL — AB
Hgb urine dipstick: NEGATIVE
Specific Gravity, Urine: 1.026 (ref 1.005–1.030)
Urobilinogen, UA: 0.2 mg/dL (ref 0.0–1.0)

## 2011-05-27 LAB — POCT I-STAT, CHEM 8
Chloride: 105 mEq/L (ref 96–112)
Creatinine, Ser: 1.1 mg/dL (ref 0.50–1.10)
Glucose, Bld: 392 mg/dL — ABNORMAL HIGH (ref 70–99)
HCT: 45 % (ref 36.0–46.0)
Potassium: 4.7 mEq/L (ref 3.5–5.1)
Sodium: 137 mEq/L (ref 135–145)

## 2011-05-27 LAB — HEPATIC FUNCTION PANEL
ALT: 16 U/L (ref 0–35)
AST: 11 U/L (ref 0–37)
Albumin: 3.4 g/dL — ABNORMAL LOW (ref 3.5–5.2)
Bilirubin, Direct: 0.1 mg/dL (ref 0.0–0.3)
Total Bilirubin: 0.5 mg/dL (ref 0.3–1.2)

## 2011-05-27 LAB — BASIC METABOLIC PANEL
BUN: 23 mg/dL (ref 6–23)
CO2: 25 mEq/L (ref 19–32)
Calcium: 9.8 mg/dL (ref 8.4–10.5)
Chloride: 102 mEq/L (ref 96–112)
Creatinine, Ser: 1.16 mg/dL — ABNORMAL HIGH (ref 0.50–1.10)
Glucose, Bld: 224 mg/dL — ABNORMAL HIGH (ref 70–99)

## 2011-05-27 LAB — DIFFERENTIAL
Basophils Absolute: 0 10*3/uL (ref 0.0–0.1)
Lymphocytes Relative: 14 % (ref 12–46)
Lymphs Abs: 0.6 10*3/uL — ABNORMAL LOW (ref 0.7–4.0)
Neutro Abs: 3.3 10*3/uL (ref 1.7–7.7)
Neutrophils Relative %: 71 % (ref 43–77)

## 2011-05-27 LAB — HEMOGLOBIN A1C
Hgb A1c MFr Bld: 8.5 % — ABNORMAL HIGH (ref <5.7)
Mean Plasma Glucose: 197 mg/dL — ABNORMAL HIGH (ref <117)

## 2011-05-27 LAB — TSH: TSH: 2.81 u[IU]/mL (ref 0.350–4.500)

## 2011-05-27 LAB — GLUCOSE, CAPILLARY
Glucose-Capillary: 108 mg/dL — ABNORMAL HIGH (ref 70–99)
Glucose-Capillary: 65 mg/dL — ABNORMAL LOW (ref 70–99)
Glucose-Capillary: 69 mg/dL — ABNORMAL LOW (ref 70–99)

## 2011-05-27 LAB — CBC
HCT: 42.4 % (ref 36.0–46.0)
MCV: 88.5 fL (ref 78.0–100.0)
RBC: 4.79 MIL/uL (ref 3.87–5.11)
WBC: 4.6 10*3/uL (ref 4.0–10.5)

## 2011-05-27 NOTE — H&P (Signed)
Hospital Admission Note Date: 05/27/2011  Patient name: [name] Medical record number: [id number] Date of birth: 1966/04/01 Age: 45 y.o. Gender: female PCP: [name], MD  Medical Service: [facility] Medicine Teaching Service  Attending physician:  Dr. Phillips Odor   Pager: Resident [phone]):             Dr. Anselm Jungling   Pager:[phone] Resident (R1):  Dr. 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 She presents today for  A right leg pain and wound that she acquired 1 week ago by  Striking her right shin against the ambulance door (apparently had an ED visit for a hypoglycemic event at that time).  Patient reports an escalation in pain from 3/10 up to 9/10 over 7 days which prompted her seek medical attention. Patient state that her  Wound odes not drain but the skin "becomes more dark around it."  She states that pain is aggravated by walking and better with rest. Denies radiculopathy. She also endorses chills  and thirst but no fever, sweats, HA, blurry vision,being light headed; CP, racing of heart, SOB, pain with respirations, leg swelling or inability to bear weight. Meds:  1. Amlodipine 5 mg nightly.   2. Coreg 25 mg 2 tablets b.i.d.   3. Lantus insulin 40 units b.i.d.   4. CellCept 250 mg 2 tablets b.i.d.   5. NovoLog sliding scale insulin.   6. Prednisone 5 mg every morning.   7. Zantac 150 mg nightly.   8.Zoloft 25 mg q.a.m.   9.Synthroid 137 mcg  every morning.   10.Prograf 1 mg b.i.d.     Allergies: ACE->cough Adhesive tape->pruritus  History  Past Medical History: 1. History of  Chronic cellulitis/ulcer, left leg with history of enterococcal       cellulitis and abscess sp wound debridement and VAC.  2. 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 Diabetes mellitus poorly controlled, insulin dependent.   4. Hypertension.   5. Hypothyroidism.   6. Depression.   Social History  . Marital Status: Married    Spouse Name: N/A    Number of Children: 5 y/o son  . Years of Education: 12th grade   Occupational History  . Disabled   Social History Main Topics  . Smoking status: 15 pack year history; quit in 2011  . Smokeless tobacco: Not on file  . Alcohol Use: No  . Drug Use: No  . Sexually Active: yes   Other Topics Concern  . Has Medicare   Social History Narrative  . No narrative on file    Review of Systems: Per HPI  Physical Exam: VS: BP 116/73 P 73 R 18, Temp 97.5, O2 Sat 94% RA General: alert, well-developed, and cooperative to examination.  Head: atraumatic.  Eyes: vision grossly intact, pupils equal, pupils round, pupils reactive to light, no injection  and anicteric.  Mouth: pharynx pink anddry, no erythema, and no exudates.  Neck: supple, full ROM, no thyromegaly, no JVD, and no carotid bruits.  Lungs: normal respiratory effort, no accessory muscle use, normal breath sounds, no crackles, and no wheezes. Heart: normal rate, regular rhythm, no murmur, no gallop, and no rub.  Abdomen: soft, non-tender, normal bowel sounds, no distention, no guarding, no rebound tenderness, no hepatomegaly, and no splenomegaly.  Msk: no joint swelling, no joint warmth, and no redness over joints.  Pulses: 2+ DP/PT pulses bilaterally Extremities: No cyanosis,  clubbing, R LE nonpitting edema up to ankle; no calf TTP bialtearlly. Neurologic: alert & oriented X3, cranial nerves II-XII intact, strength normal in all extremities, sensation intact to light touch, and gait normal.  Skin: Right LE anterior aspect with with an ulceration 3/3 cm with surrounding erythema and increased warmth to touch, TTP, no exudate or crepitus. Left LE shin with an old healing wound with hyperpigmented skin around it and without increased warmth or tenderness with palpation;  Turgor normal and no rashes. Old and well healed scar to lumbar spine. Psych: Oriented X3, memory intact for recent and remote, normally interactive, good eye contact, not anxious appearing, and not depressed appearing.    Lab results: Basic Metabolic Panel:  Basename 05/27/11 0843  NA 137  K 4.7  CL 105  CO2 --  GLUCOSE 392*  BUN 30*  CREATININE 1.10  CALCIUM --  MG --  PHOS --   Liver Function Tests: No results found for this basename: AST:2,ALT:2,ALKPHOS:2,BILITOT:2,PROT:2,ALBUMIN:2 in the last 72 hours No results found for this basename: LIPASE:2,AMYLASE:2 in the last 72 hours No results found for this basename: AMMONIA:2 in the last 72 hours CBC:  Basename 05/27/11 0843 05/27/11 0809  WBC -- 4.6  NEUTROABS -- 3.3  HGB 15.3* 13.7  HCT 45.0 42.4  MCV -- 88.5  PLT -- 191   Cardiac Enzymes: No results found for this basename: CKTOTAL:3,CKMB:3,CKMBINDEX:3,TROPONINI:3 in the last 72 hours BNP: No results found for this basename: POCBNP:3 in the last 72 hours D-Dimer: No results found for this basename: DDIMER:2 in the last 72 hours CBG:  Basename 05/27/11 0839  GLUCAP 361*   Imaging results:  Dg Tibia/fibula Right  05/27/2011  *RADIOLOGY REPORT*  Clinical Data: Nonhealing soft tissue wound.  Pain.  RIGHT TIBIA AND FIBULA - 2 VIEW  Comparison: None.  Findings: A soft tissue wound overlies the volar aspect of the tibial mid shaft.  No underlying cortical erosion to suggest  osteomyelitis.  IMPRESSION: Soft tissue wound overlies the volar aspect of the tibial mid shaft, without evidence of osteomyelitis.  Original Report Authenticated By: Reyes Ivan, M.D.   Assessment & Plan by Problem:  1. Right lower extremity wound with cellulitis status post trauma. 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 Therefore, will admit for an aggressive IV antibiotic therapy and observation. - No blood cx were drawn prior to initiation of treatment with Vancomycin in ED. -Pain management with Vicodin -Elevate right LE -no wound care needed at this time. -If pain persists and/or worsens, consider additional diagnoses such as DVT and compartment syndrome.  2. Insulin-dependent DM, uncontrolled. No AG on B-met. Will order UA to evaluate for ketones, proteinuria and specific gravity. - Start Home regimen with lantus 40 Units SQ q 12hrs and Novolog 8 units SQ tid AC -CBG's per protocol -HgbA1C, TSH, FLP -  Repeat B-met  At 7 pm to reevaluate electrolytes -Carb modified diet -Counseled on adherence with treatment regimen.   3. 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 Right arm AVG without signs of infection. Patient's Creatine is normal and at her baseline. - notify to Dr. Abel Presto of his patient's admission -continue with prograf, cellcept and prednisone -daily I/O's -up to date on Pneumovax -administer Fluvax  4. Hypothyroidism. -continue with synthroid 5. DVT PPx: Heparin SQ.  R 3 Karimova______________________________        ATTENDING: I performed and/or observed a history and physical examination of the patient.  I discussed the case with the residents as noted and reviewed the residents' notes.  I agree with the findings and plan--please refer to the attending physician note for more  details.  Signature________________________________  Printed Name_____________________________

## 2011-05-28 ENCOUNTER — Encounter: Payer: Self-pay | Admitting: Internal Medicine

## 2011-05-28 DIAGNOSIS — E108 Type 1 diabetes mellitus with unspecified complications: Secondary | ICD-10-CM

## 2011-05-28 DIAGNOSIS — E1065 Type 1 diabetes mellitus with hyperglycemia: Secondary | ICD-10-CM

## 2011-05-28 DIAGNOSIS — L089 Local infection of the skin and subcutaneous tissue, unspecified: Secondary | ICD-10-CM

## 2011-05-28 LAB — CBC
MCH: 28.5 pg (ref 26.0–34.0)
MCHC: 32.1 g/dL (ref 30.0–36.0)
MCV: 88.7 fL (ref 78.0–100.0)
Platelets: 172 10*3/uL (ref 150–400)
RBC: 4.67 MIL/uL (ref 3.87–5.11)

## 2011-05-28 LAB — BASIC METABOLIC PANEL
CO2: 26 mEq/L (ref 19–32)
Calcium: 10.3 mg/dL (ref 8.4–10.5)
Creatinine, Ser: 1.06 mg/dL (ref 0.50–1.10)
GFR calc non Af Amer: 56 mL/min — ABNORMAL LOW (ref >60)
Sodium: 135 mEq/L (ref 135–145)

## 2011-05-28 LAB — GLUCOSE, CAPILLARY
Glucose-Capillary: 150 mg/dL — ABNORMAL HIGH (ref 70–99)
Glucose-Capillary: 155 mg/dL — ABNORMAL HIGH (ref 70–99)

## 2011-05-29 ENCOUNTER — Institutional Professional Consult (permissible substitution): Payer: Medicare Other | Admitting: Internal Medicine

## 2011-05-31 NOTE — Discharge Summary (Signed)
Pt seen for R LE ulcer from bumping into ambulance door 1 week PTA, s/p renal graft April 2011. On rejection meds. Has L LE ulcer that is healing (pt has had multiple complications with that ulcer) Apt with wound care. Sees Colodonato with renal. Here for establish PCP. Needs better control of her diabetes.

## 2011-06-03 IMAGING — CR DG CHEST 2V
2 series · 2 of 2 positions shown · non-contrast
Comparison: 06/04/2009

CLINICAL DATA: Headache, weakness, hypertension

CHEST - 2 VIEW

[w chest pa]
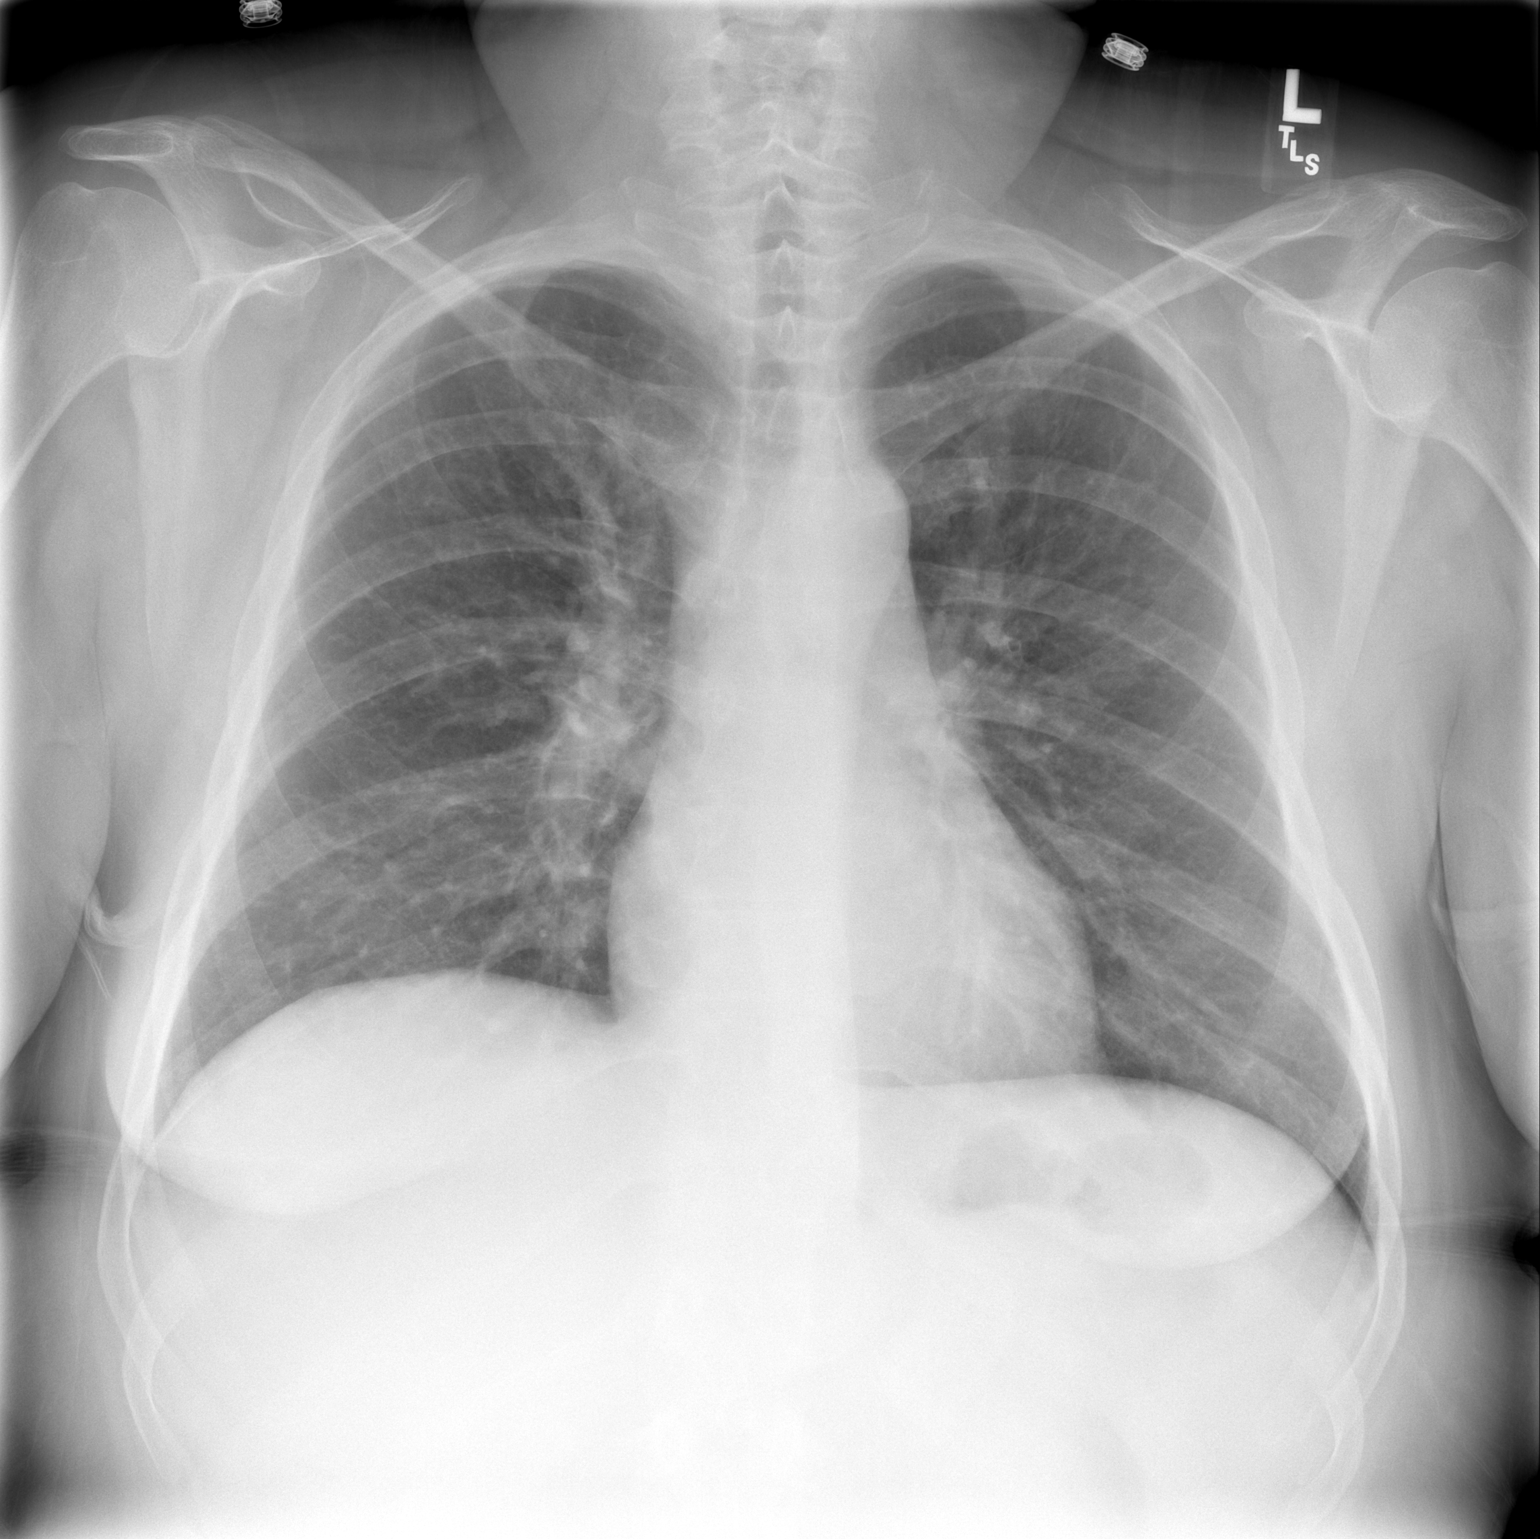

[w chest lat]
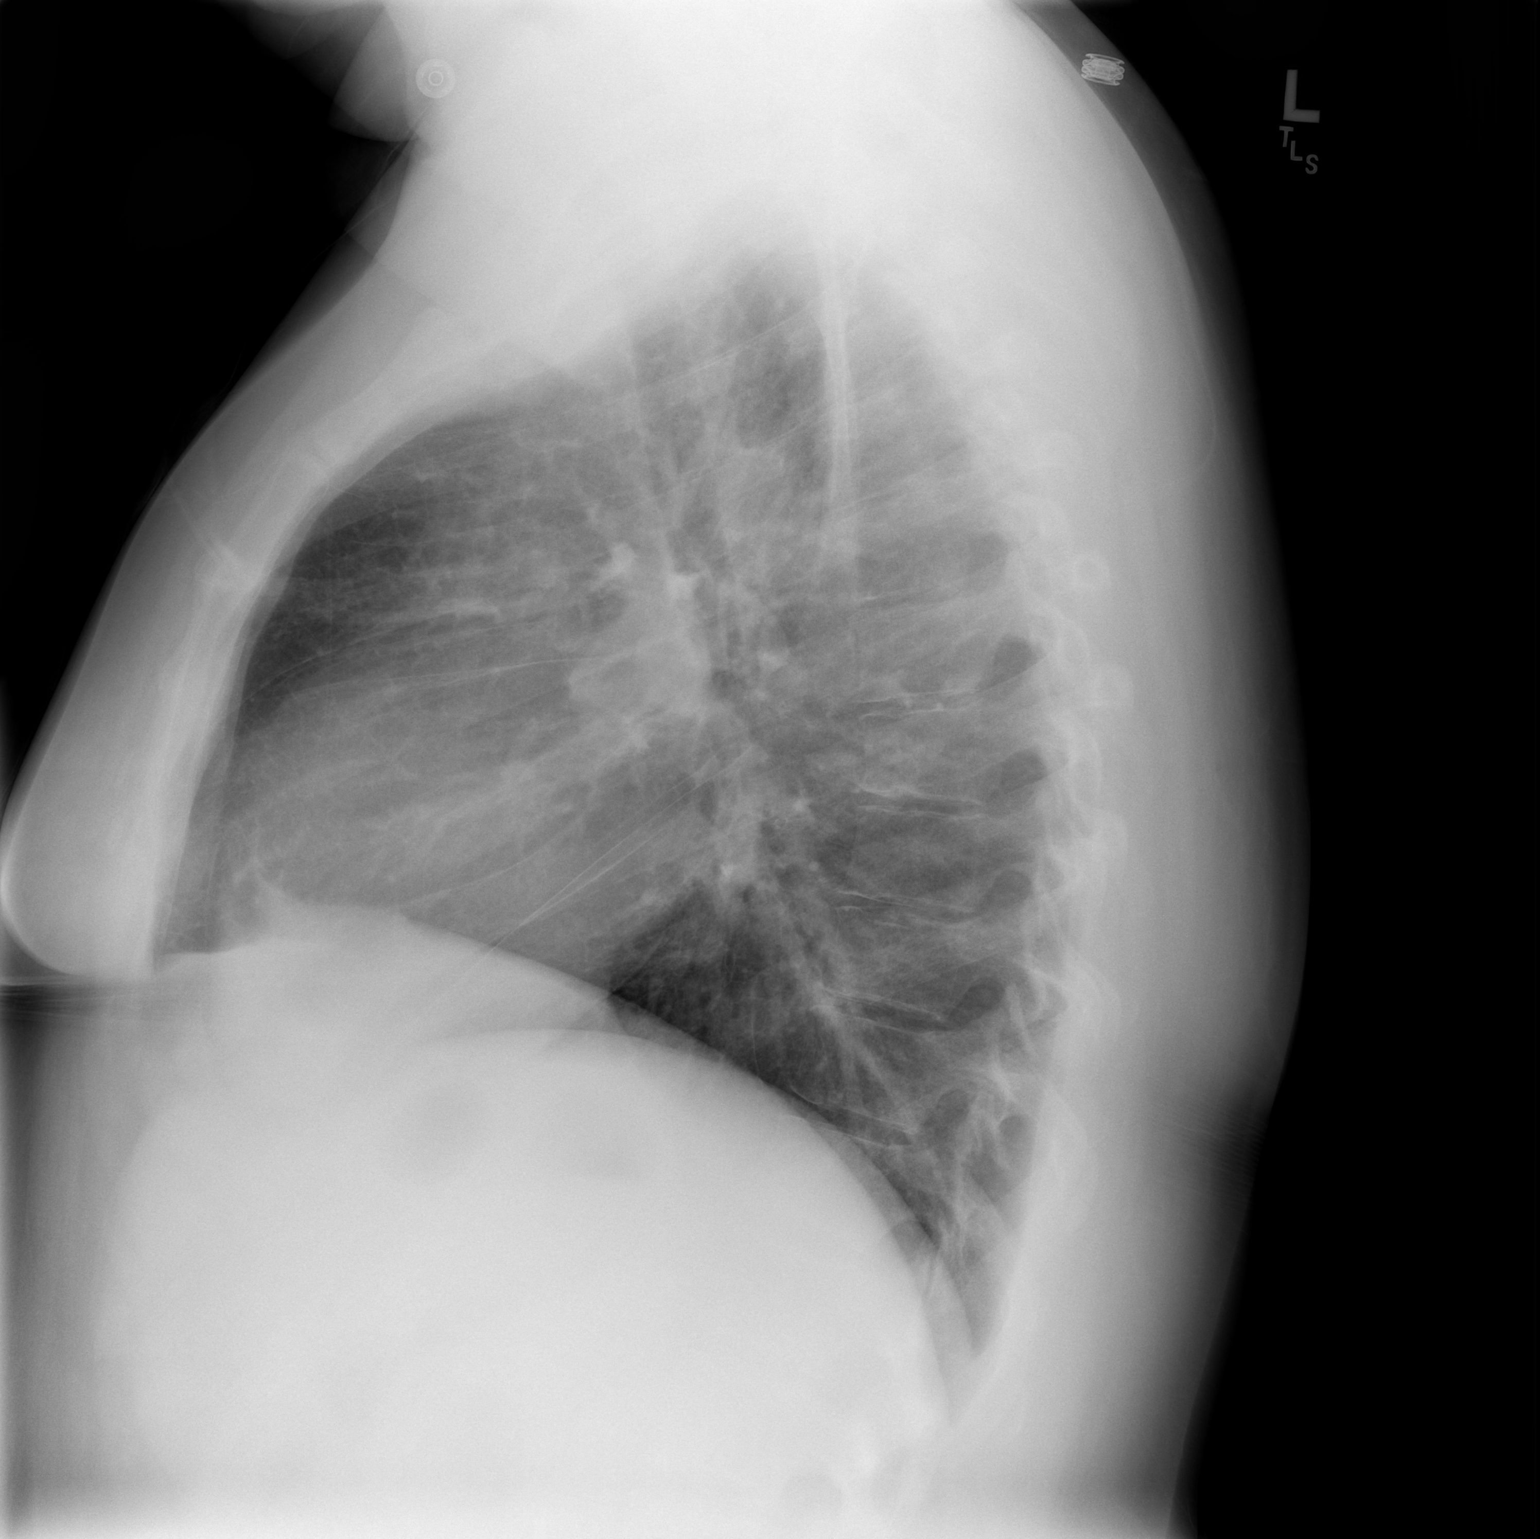

[2 of 2 positions shown; findings below may reference images not displayed]

FINDINGS: Lungs clear.  Heart size and pulmonary vascularity
normal.  No effusion.  Visualized bones unremarkable.
IMPRESSION: No acute disease

## 2011-06-03 IMAGING — CT CT HEAD W/O CM
1 series · 16 of 30 positions shown, 20 images · non-contrast
Comparison: 03/06/2007

CLINICAL DATA: Hyperglycemia.  Severe headache.  Hypertension.
Diabetes.  Renal failure.

CT HEAD WITHOUT CONTRAST
TECHNIQUE: Contiguous axial images were obtained from the base of
the skull through the vertex without contrast.

[Series 2: head routine 4.8 h37s · axial · 0.46mm/px · z∈[+1121,+1284]mm · 16 of 36 slices shown, 20 images]
[im 2/36  brain]
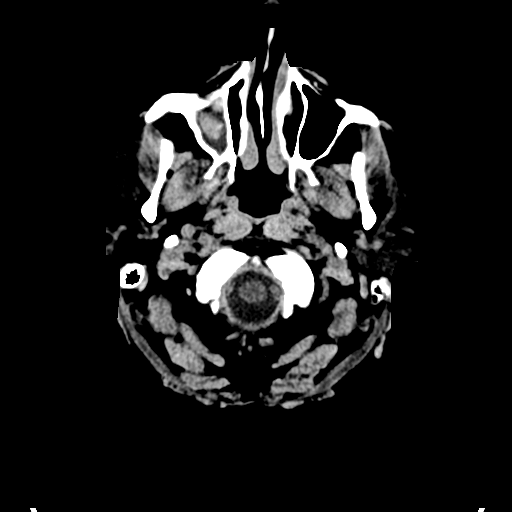
[im 2/36  bone]
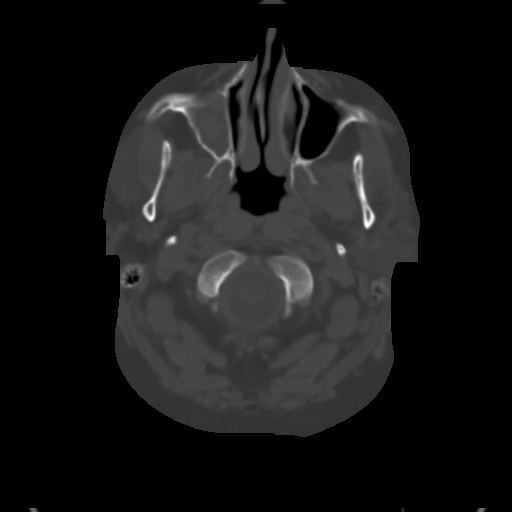
[im 4/36  brain]
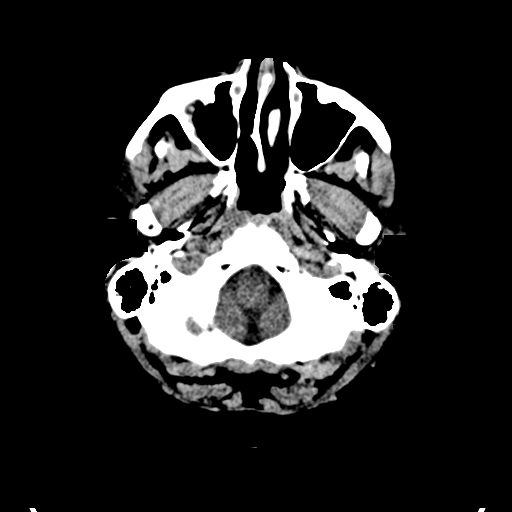
[im 7/36  brain]
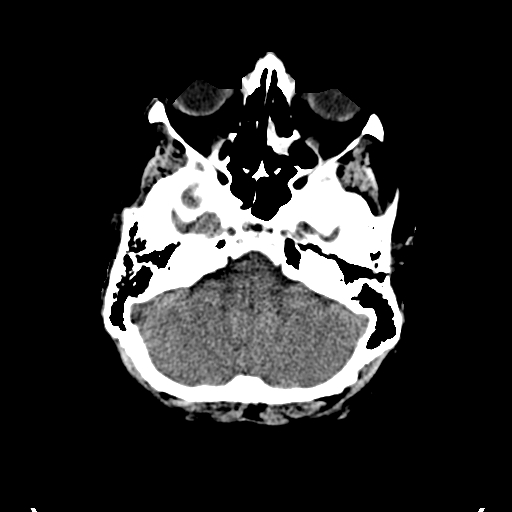
[im 9/36  brain]
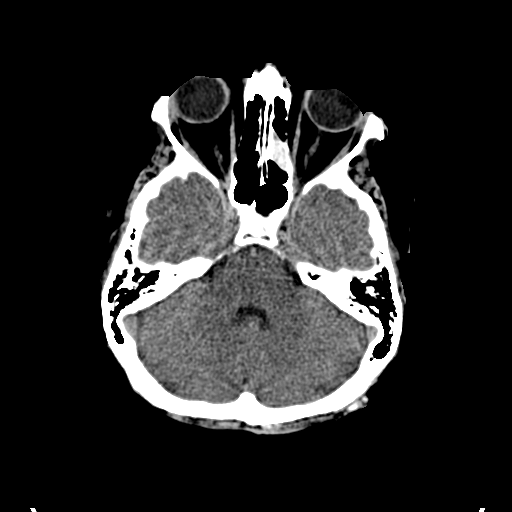
[im 10/36  brain]
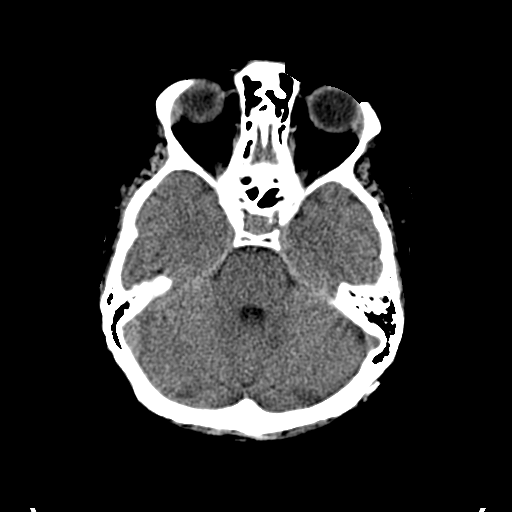
[im 10/36  bone]
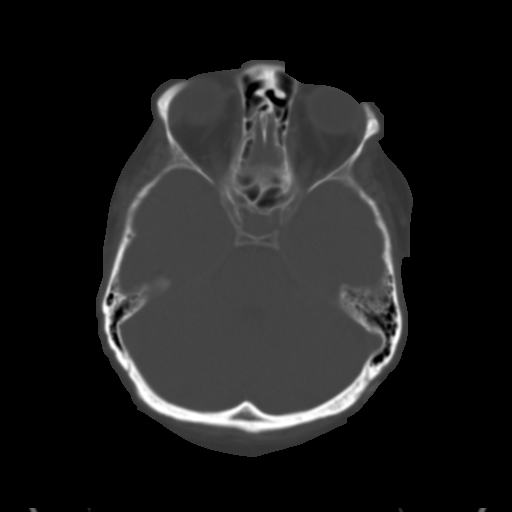
[im 13/36  brain]
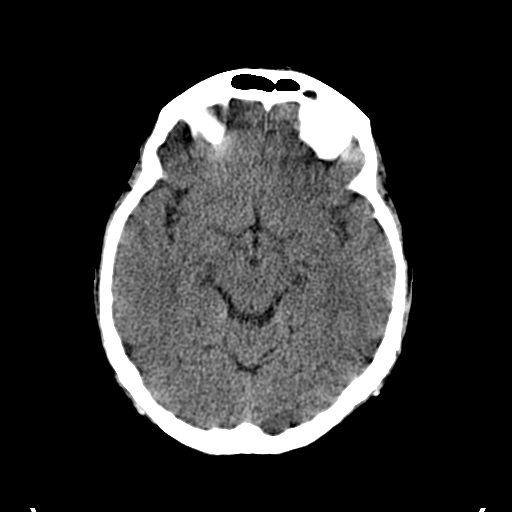
[im 15/36  brain]
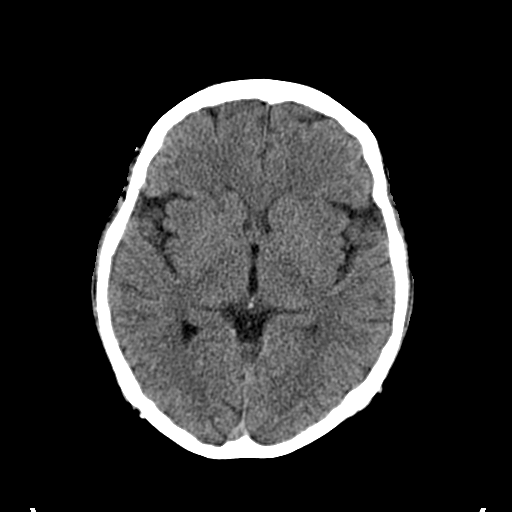
[im 17/36  brain]
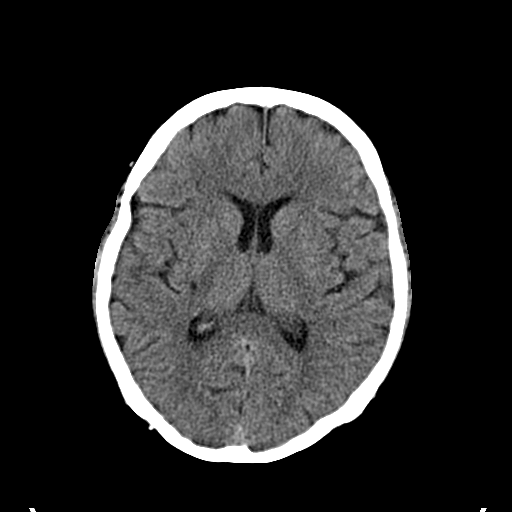
[im 19/36  brain]
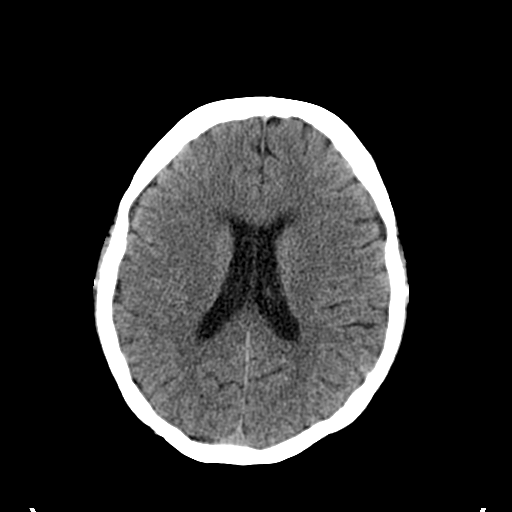
[im 19/36  bone]
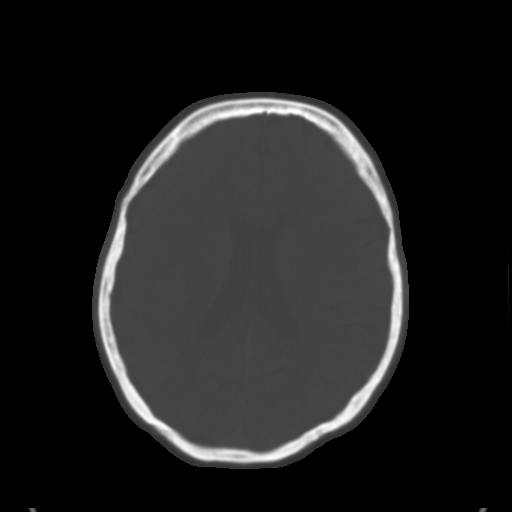
[im 21/36  brain]
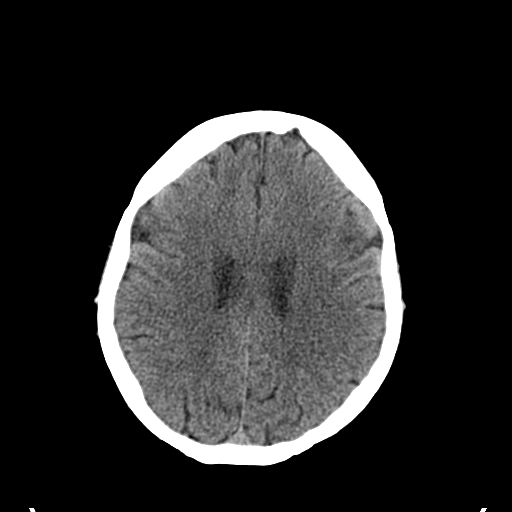
[im 23/36  brain]
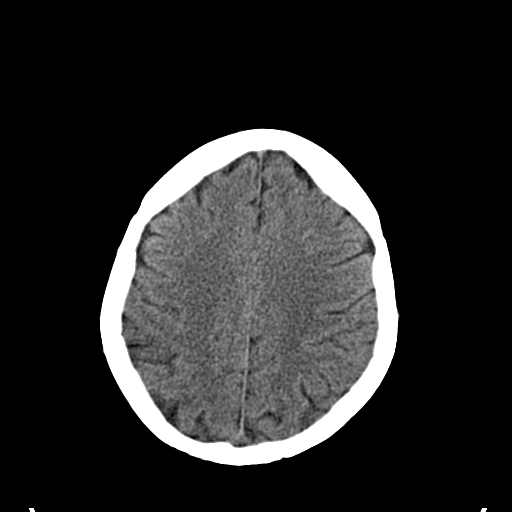
[im 26/36  brain]
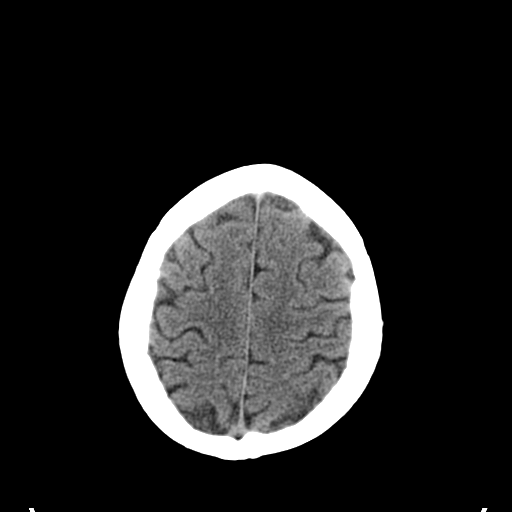
[im 27/36  brain]
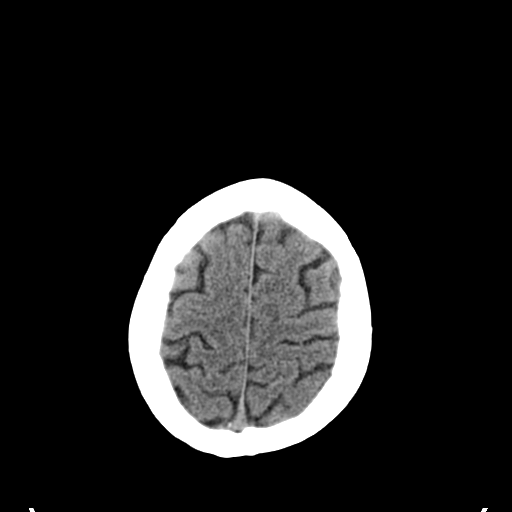
[im 27/36  bone]
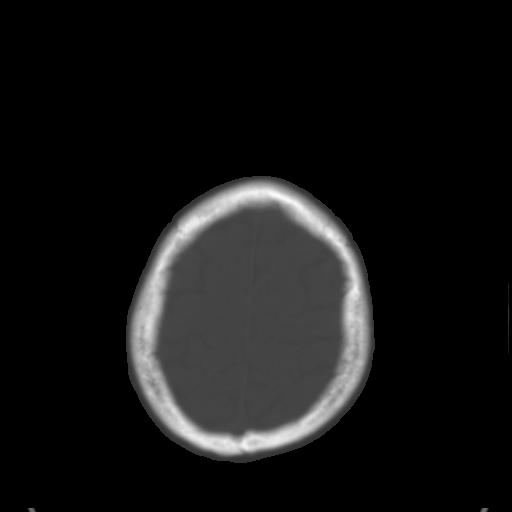
[im 29/36  brain]
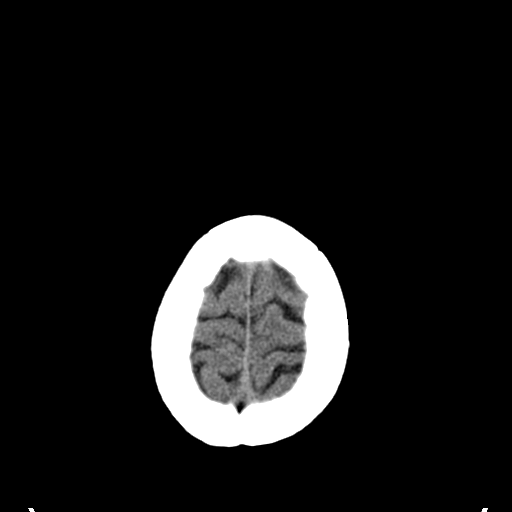
[im 32/36  brain]
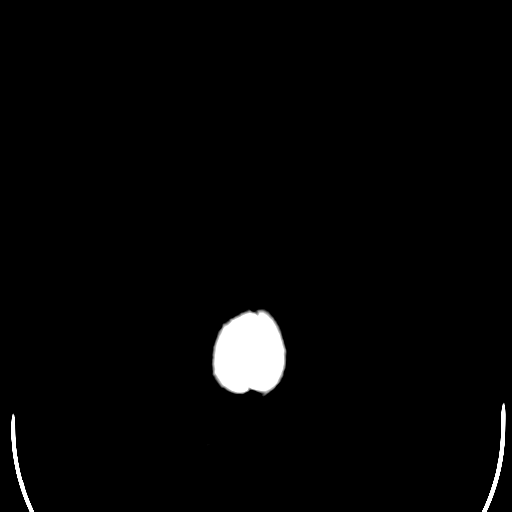
[im 34/36  brain]
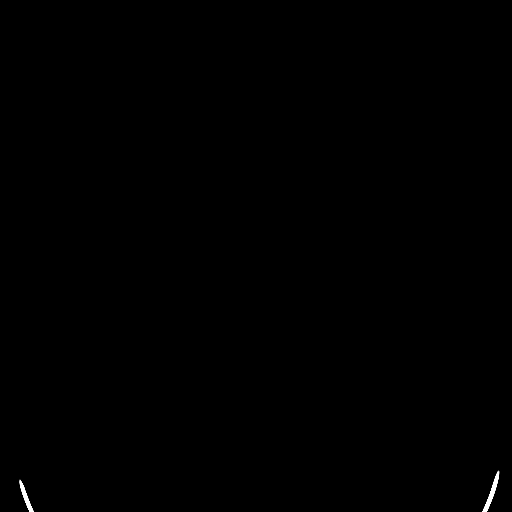

[16 of 30 positions shown; findings below may reference images not displayed]

FINDINGS: There is no sign of acute infarction, mass lesion,
hemorrhage, hydrocephalus or extra-axial collection.  There are a
few small low densities in the right basal ganglia which are old.
No calvarial abnormality.  Sinuses shows some mucosal inflammation
in the right maxillary sinus and opacification of a few of the
ethmoid air cells on the left.
IMPRESSION: No acute finding.  Normal appearance of the brain except for a few
small low densities in right basal ganglia which are chronic.

Some mucosal inflammatory disease of the right maxillary sinus and
a few of the left ethmoid air cells.

## 2011-06-04 ENCOUNTER — Encounter: Payer: Medicare Other | Admitting: Internal Medicine

## 2011-06-05 LAB — POCT I-STAT 4, (NA,K, GLUC, HGB,HCT)
Glucose, Bld: 389 — ABNORMAL HIGH
HCT: 39
Hemoglobin: 13.3
Potassium: 4.3

## 2011-06-09 ENCOUNTER — Encounter (HOSPITAL_BASED_OUTPATIENT_CLINIC_OR_DEPARTMENT_OTHER): Payer: Medicare Other | Attending: Internal Medicine

## 2011-06-12 ENCOUNTER — Encounter: Payer: Self-pay | Admitting: *Deleted

## 2011-06-15 ENCOUNTER — Institutional Professional Consult (permissible substitution): Payer: Medicare Other | Admitting: Internal Medicine

## 2011-06-15 NOTE — Discharge Summary (Signed)
[name], MCCANN              ACCOUNT NO.:  [id number]  MEDICAL RECORD NO.:  [id number]  LOCATION:  3031                         FACILITY:  [facility]  PHYSICIAN:  [name], D.O.DATE OF BIRTH:  1965/11/01  DATE OF ADMISSION:  05/27/2011 DATE OF DISCHARGE:  05/28/2011                              DISCHARGE SUMMARY   DISCHARGE DIAGNOSES: 1. Right lower extremity wound with cellulitis status post trauma. 2. Insulin-dependent diabetes mellitus, uncontrolled. 3. 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 4. End-stage renal disease status post transplant. 5. Hypothyroidism. 6. Hypertension.  DISCHARGE MEDICATIONS: 1. Doxycycline 100 mg 1 tablet by mouth twice daily for 5 days. 2. Amlodipine 5 mg 1 tablet by mouth daily at bedtime. 3. Carvedilol 25 mg 2 tablets by mouth twice daily. 4. Lantus 40 units subcutaneously twice daily. 5. CellCept 250 mg 2 tablets by mouth twice daily. 6. NovoLog 8 units with each meal subcutaneously 3 times a day. 7. Prednisone 5 mg 1 tablet by mouth every evening. 8. Ranitidine 150 mg 1 tablet by mouth daily at bedtime. 9. Zoloft 25 mg 1 tablet by mouth every morning. 10.Synthroid 137 mcg 1 tablet by mouth every evening. 11.Prograf 1 mg 1 tablet by mouth twice daily.  DISPOSITION AND FOLLOWUP:  The patient will be seen for followup at [facility] on June 09, 2011, at 1 o'clock, their number is [phone], and they are in [place], third floor.  The patient will also see Dr. Allena [name] at our outpatient clinic on June 04, 2011, at 2:15 in the afternoon.  The patient does not have followup with Dr. Arrie [name] other than usual scheduled followup.  At this point, please check on first the wound how it is doing, if it is healing, if it is getting better, getting worse, any fevers or chills.  Please remind Dr. Allena [name] about her [facility] appointment.  Please let her know that she should be 20-30 minutes early for that appointment.   No laboratory studies need to be checked at this visit, just check on the wound nature, if it is doing well.  Otherwise, she does need some diabetic management, so it may be possible to change her regimen at that time, otherwise counseled on compliance and encouraged her to take her medications as scheduled.  PROCEDURES PERFORMED:  None.  CONSULTATIONS:  None.  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  There was an enterococcal infection and cellulitis and [facility]  Today, she presents with a right leg pain and wound that she acquired 1 week ago.  Apparently, she struck her right shin against the ambulance door as they were loading and unloading her.  She was apparently feeling hypoglycemic, decided to come in, and got the wounds there.  She has had excruciating pain from 3/10 to 9/10, and they lasted 7 days, which prompted her to seek medical attention, said that  it has a little bit of odor, it does not drain, but the skin is a little bit dark around it.  Pain is aggravated by walking, better with rest.  Denies any kind of radiculopathy.  She also endorses chills, but no fever, no sweats, no headache, no blurred vision, no lightheadedness, no chest pain, no racing of the heart, no shortness of breath, no pain when she breathes, no leg swelling, and no inability to bear weight.  HOME MEDICATIONS: 1. Amlodipine 5 mg p.o. nightly. 2. Coreg 25 mg 2 tablets b.i.d. 3. Lantus insulin 40 units subcutaneously injected b.i.d. 4. CellCept 250 mg 2 tablets twice daily. 5. NovoLog sliding scale insulin. 6.  Prednisone 5 mg every morning. 7. Zantac 150 mg nightly. 8. Zoloft 25 mg every morning. 9. Synthroid 137 mcg every morning. 10.Prograf 1 mg b.i.d.  ALLERGIES:  She does have allergy to ACE INHIBITOR, it gave her a cough and to ADHESIVE TAPE, it gives her itching.  PAST MEDICAL HISTORY: 1. 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 2. End-stage renal disease secondary to the diabetes status post     cadaveric graft last year, normal renal function currently,     creatinine baseline 1.1.  She does have a right arm AV graft. 3. Diabetes mellitus, poorly controlled, insulin dependent. 4. Hypertension. 5. Hypothyroid. 6. Depression.  SOCIAL HISTORY:  She is married and currently on disability.  PHYSICAL EXAMINATION:  VITAL SIGNS:  On the day of exam, blood pressure 116/73, pulse 73, respirations 18, temperature 97.5, O2 saturation 94% on room air. GENERAL:  She is alert, well developed, cooperative to exam. HEENT:  Head, atraumatic.  Eyes, vision grossly intact.  Pupils equal, round, and reactive to light and accommodation.  Anicteric.  Mouth, pharynx pink and dry.  No erythema, no exudates. NECK:  Supple.  Full range of motion.  No thyromegaly, no JVD, no carotid bruits. LUNGS:  Normal respiratory effort.  No accessory muscle use.  Breath sounds normal.  No crackles, no wheezes. HEART:  Normal rate, regular rhythm.  No murmurs, rubs, gallops. ABDOMEN:  Soft, nontender.  Normal bowel sounds.  No distention, no guarding, no rebound tenderness, no hepatomegaly, no splenomegaly. MUSCULOSKELETAL:  No joint swelling.  No joint warmth.  No redness over the joint.  Pulses 2+ bilateral dorsalis pedis, posterior tibial bilaterally. EXTREMITIES:  No cyanosis, clubbing.  Right lower extremity, nonpitting edema to the ankle.  No calf tenderness to palpation bilaterally. NEUROLOGIC:  Alert and  oriented x3.  Cranial nerves II through XII intact grossly.  Strength normal in all extremities.  Sensation intact to light touch.  Gait is normal. SKIN:  Right lower extremity anterior aspect with an ulceration approximately 3 cm in diameter with surrounding erythema, increased warmth to touch, tender to palpation.  No exudate, no crepitus, and there is kind of a little black eschar about approximately 1 cm in diameter in the center of this wound.  Left lower extremity does have a shin with an old healing wound, hyperpigmented skin around it without increased warmth or tenderness to palpation.  The skin turgor is normal. No rashes and there is an old and well-healed scars to the lumbar spine. PSYCHIATRIC:  She was oriented x3.  Memory intact for recent and remote events.  Normally interactive, good eye contact, not anxious appearing, not depressed appearing.  LABORATORY RESULTS:  Basic metabolic  panel showing sodium 137, potassium 4.7, chloride 105, BUN 30, creatinine 1.10, glucose 392.  CBC drawn that did show white blood count of 4.6, hemoglobin 13.7, platelet 191.  There was a capillary glucose that was also drawn that was 361.  There was the following imaging results.  A diagnostic x-ray of the right tibia fibula was done that did show a soft tissue wound overlying the volar aspect of the tibial midshaft, no underlying cortical origin to suggest osteomyelitis.  No evidence of any gas formation under this wound either.  HOSPITAL COURSE BY PROBLEM: 1. Right lower extremity wound with cellulitis status post trauma.     The patient is immunosuppressed and does have this history of this     nonhealing left lower extremity ulcer as well as 3 toe amputations     in the past.  Therefore, we did admit her for aggressive IV     antibiotic therapy and observation.  She did remain stable and     afebrile during this hospitalization and did have resolution of     some of the warmth on the  wound area during this hospitalization     with IV vancomycin.  We did feel that she was stable to be     discharged with the appropriate wound care followup and she did     have home health coming to provide wound care at home as well.  We     will see her in our clinic and she does have an appointment with a     [facility] in October 2012, for close followup.  We did have pain     management during this hospitalization.  She was stable and out of     pain during this hospitalization.  We did do dressing changes for     her every 3 days and this was ordered through home health, so they     will be able to go and take care of this for her, and we will see     her back in our clinic and she was stable for discharge the     following day. 2. Insulin-dependent diabetes mellitus, uncontrolled.  There was no     anion gap on admission, so we did get a UA to evaluate for ketones.     We did start back her on home regimen and do CBGs per protocol.  We     had a hemoglobin A1c which was 7.8, TSH, and fasting lipid panel,     and we did repeat BMET to make sure her potassium did not drop once     we started giving her insulin in the hospital and it was stable.     We did put her on a carb-modified diet and we did counsel her on     adherence with her current regimen as there was concern that she     may not have been taking it appropriately at home.  She was stable     during this hospitalization.  Her glucoses were fairly normal     during this hospitalization and she was stable for discharge on her     home medications again at discharge. 3. 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  She did have no     signs of infection in her right arm AV graft.  Creatinine is normal     and at her baseline.  We did let Dr. Arrie [name] know that she was  in the hospital, however, we did not feel it was necessary for him     to see her.  We did continue her home medications; Prograf,     CellCept,  and prednisone.  We did watch her I and O's, and we did     administer flu vaccine and pneumo vaccine.  She was stable at the     time of discharge. 4. End-stage renal disease.  The patient is status post cadaveric     graft in 2011.  The underlying renal cause was diabetes, so it is     felt that we need to see her in our clinic, so we can get better     control of her diabetes, so that she does not have more problems     with this new cadaveric graft. 5. Hypothyroidism.  We did continue her home Synthroid dose.  We also     got a TSH which was normal. 6. Hypertension.  She was normotensive during this hospitalization.     We did continue most of her home medications.  We did send her on     her home medication regimen. 7. Depression.  The patient was kept on her home medications and did     not have any problems with acute worsening of her depression and it     is fairly well controlled.  We did continue her home medications at     the time of discharge and this problem was stable during this     hospitalization.  Overall, the patient was stable for discharge at this time and she was sent home with additional 5 days of doxycycline to be taken twice daily. It was not felt that this wound was really acutely infected, however, we did feel that it is appropriate to cover her prophylactically for this. We felt that for more acute issue was to get her seen in our clinic to get better control of her diabetes and we hope that her wounds will heal better once the diabetes is better controlled, and she will be seen also in the [facility], so they can help to speed the process of the wound healing.  Vitals on the day of discharge; her temperature was 97.8, pulse 72, respirations 18, blood pressure 128/60, O2 saturation 94% on room air. Additional results during this hospitalization.  She did have a basic metabolic panel showed a sodium of 135, potassium 4.4, chloride 103, bicarb 26, glucose  140, BUN 20, creatinine 1.06, calcium of 10.30.  She did have a repeat CBC that was drawn that did show white count of 4.1, hemoglobin of 13.3, and platelet count of 172.  Hemoglobin A1c which did result and showed a hemoglobin value of 8.5.  TSH that was drawn that was normal at 2.8.  A MRSA screen that was positive PCR.  A urinalysis that was negative for signs of infection.  There were capillary glucoses that were drawn that were ranging from approximately 69-278, however, on the day of discharge, both sides were in the 150s.  There was no additional imaging done during this hospitalization.    ______________________________ Genella Mech, MD   ______________________________ [name], D.O.    EK/MEDQ  D:  05/28/2011  T:  05/28/2011  Job:  [id number]  cc:   [facility] Rhodes, M.D.  Electronically Signed by Genella Mech MD on 05/31/2011 03:06:13 PM Electronically Signed by Anderson Malta D.O. on 06/15/2011 04:56:04 PM

## 2011-06-18 LAB — POCT I-STAT 4, (NA,K, GLUC, HGB,HCT)
Operator id: 154481
Sodium: 133 — ABNORMAL LOW

## 2011-06-21 ENCOUNTER — Observation Stay (HOSPITAL_COMMUNITY)
Admission: EM | Admit: 2011-06-21 | Discharge: 2011-06-22 | Disposition: A | Discharge status: Home / Self Care | Payer: Medicare Other | Attending: Emergency Medicine | Admitting: Emergency Medicine

## 2011-06-21 ENCOUNTER — Emergency Department (HOSPITAL_COMMUNITY): Payer: Medicare Other

## 2011-06-21 DIAGNOSIS — E119 Type 2 diabetes mellitus without complications: Principal | ICD-10-CM | POA: Insufficient documentation

## 2011-06-21 DIAGNOSIS — J069 Acute upper respiratory infection, unspecified: Secondary | ICD-10-CM | POA: Insufficient documentation

## 2011-06-21 LAB — COMPREHENSIVE METABOLIC PANEL
ALT: 15 U/L (ref 0–35)
AST: 13 U/L (ref 0–37)
Alkaline Phosphatase: 134 U/L — ABNORMAL HIGH (ref 39–117)
CO2: 22 mEq/L (ref 19–32)
GFR calc Af Amer: 50 mL/min — ABNORMAL LOW (ref >90)
Glucose, Bld: 542 mg/dL — ABNORMAL HIGH (ref 70–99)
Potassium: 4.8 mEq/L (ref 3.5–5.1)
Sodium: 129 mEq/L — ABNORMAL LOW (ref 135–145)
Total Protein: 7.1 g/dL (ref 6.0–8.3)

## 2011-06-21 LAB — URINALYSIS, ROUTINE W REFLEX MICROSCOPIC
Bilirubin Urine: NEGATIVE
Glucose, UA: >1000 mg/dL — AB
Ketones, ur: 15 mg/dL — AB
pH: 5 (ref 5.0–8.0)

## 2011-06-21 LAB — CBC
Hemoglobin: 14 g/dL (ref 12.0–15.0)
MCHC: 32.9 g/dL (ref 30.0–36.0)
RBC: 4.82 MIL/uL (ref 3.87–5.11)

## 2011-06-21 LAB — GLUCOSE, CAPILLARY

## 2011-06-21 LAB — DIFFERENTIAL
Basophils Absolute: 0 10*3/uL (ref 0.0–0.1)
Basophils Relative: 0 % (ref 0–1)
Neutro Abs: 4.2 10*3/uL (ref 1.7–7.7)
Neutrophils Relative %: 73 % (ref 43–77)

## 2011-06-21 LAB — URINE MICROSCOPIC-ADD ON

## 2011-06-22 LAB — BASIC METABOLIC PANEL
BUN: 21
BUN: 52 — ABNORMAL HIGH
BUN: 54 — ABNORMAL HIGH
BUN: 56 — ABNORMAL HIGH
BUN: 57 — ABNORMAL HIGH
BUN: 58 — ABNORMAL HIGH
BUN: 60 — ABNORMAL HIGH
BUN: 60 — ABNORMAL HIGH
CO2: 24
CO2: 24
CO2: 25
CO2: 25
CO2: 25
CO2: 26
CO2: 26
CO2: 28
Calcium: 8.6
Calcium: 8.7
Calcium: 8.7
Calcium: 8.7
Calcium: 8.8
Calcium: 8.8
Calcium: 8.9
Calcium: 8.9
Calcium: 9.1
Calcium: 9.1
Calcium: 9.1
Chloride: 88 — ABNORMAL LOW
Chloride: 91 — ABNORMAL LOW
Chloride: 91 — ABNORMAL LOW
Chloride: 93 — ABNORMAL LOW
Chloride: 94 — ABNORMAL LOW
Chloride: 95 — ABNORMAL LOW
Chloride: 99
Chloride: 99
Creatinine, Ser: 3.04 — ABNORMAL HIGH
Creatinine, Ser: 4.32 — ABNORMAL HIGH
Creatinine, Ser: 4.64 — ABNORMAL HIGH
Creatinine, Ser: 5.3 — ABNORMAL HIGH
Creatinine, Ser: 5.55 — ABNORMAL HIGH
Creatinine, Ser: 6.24 — ABNORMAL HIGH
Creatinine, Ser: 6.42 — ABNORMAL HIGH
Creatinine, Ser: 6.5 — ABNORMAL HIGH
Creatinine, Ser: 6.64 — ABNORMAL HIGH
Creatinine, Ser: 6.66 — ABNORMAL HIGH
GFR calc Af Amer: 10 — ABNORMAL LOW
GFR calc Af Amer: 11 — ABNORMAL LOW
GFR calc Af Amer: 12 — ABNORMAL LOW
GFR calc Af Amer: 13 — ABNORMAL LOW
GFR calc Af Amer: 14 — ABNORMAL LOW
GFR calc Af Amer: 21 — ABNORMAL LOW
GFR calc Af Amer: 8 — ABNORMAL LOW
GFR calc Af Amer: 9 — ABNORMAL LOW
GFR calc non Af Amer: 10 — ABNORMAL LOW
GFR calc non Af Amer: 11 — ABNORMAL LOW
GFR calc non Af Amer: 13 — ABNORMAL LOW
GFR calc non Af Amer: 17 — ABNORMAL LOW
GFR calc non Af Amer: 7 — ABNORMAL LOW
GFR calc non Af Amer: 7 — ABNORMAL LOW
GFR calc non Af Amer: 7 — ABNORMAL LOW
GFR calc non Af Amer: 7 — ABNORMAL LOW
GFR calc non Af Amer: 8 — ABNORMAL LOW
GFR calc non Af Amer: 9 — ABNORMAL LOW
Glucose, Bld: 103 — ABNORMAL HIGH
Glucose, Bld: 178 — ABNORMAL HIGH
Glucose, Bld: 206 — ABNORMAL HIGH
Glucose, Bld: 255 — ABNORMAL HIGH
Glucose, Bld: 300 — ABNORMAL HIGH
Glucose, Bld: 310 — ABNORMAL HIGH
Glucose, Bld: 313 — ABNORMAL HIGH
Glucose, Bld: 599
Potassium: 3.9
Potassium: 4.1
Potassium: 4.3
Potassium: 4.7
Potassium: 4.9
Potassium: 5.2 — ABNORMAL HIGH
Sodium: 123 — ABNORMAL LOW
Sodium: 128 — ABNORMAL LOW
Sodium: 128 — ABNORMAL LOW
Sodium: 130 — ABNORMAL LOW
Sodium: 133 — ABNORMAL LOW
Sodium: 136
Sodium: 137
Sodium: 137

## 2011-06-22 LAB — POCT I-STAT 3, ART BLOOD GAS (G3+)
Acid-base deficit: 2
Bicarbonate: 21.5
O2 Saturation: 96
Operator id: 288141
TCO2: 23
pCO2 arterial: 33.5 — ABNORMAL LOW
pH, Arterial: 7.416 — ABNORMAL HIGH
pO2, Arterial: 83

## 2011-06-22 LAB — URINE MICROSCOPIC-ADD ON

## 2011-06-22 LAB — I-STAT 8, (EC8 V) (CONVERTED LAB)
Acid-base deficit: 2
BUN: 62 — ABNORMAL HIGH
Bicarbonate: 24.3 — ABNORMAL HIGH
Chloride: 85 — ABNORMAL LOW
Glucose, Bld: >700
HCT: 41
Hemoglobin: 13.9
Operator id: 291361
Potassium: 5.6 — ABNORMAL HIGH
Sodium: 114 — CL
TCO2: 26
pCO2, Ven: 47.6
pH, Ven: 7.317 — ABNORMAL HIGH

## 2011-06-22 LAB — RENAL FUNCTION PANEL
BUN: 53 — ABNORMAL HIGH
CO2: 22
Chloride: 96
Creatinine, Ser: 5.71 — ABNORMAL HIGH
Glucose, Bld: 149 — ABNORMAL HIGH
Potassium: 3.7

## 2011-06-22 LAB — URINALYSIS, ROUTINE W REFLEX MICROSCOPIC
Bilirubin Urine: NEGATIVE
Glucose, UA: >1000 — AB
Ketones, ur: NEGATIVE
Leukocytes, UA: NEGATIVE
Nitrite: NEGATIVE
Protein, ur: 100 — AB
Specific Gravity, Urine: 1.023
Urobilinogen, UA: 0.2
pH: 7.5

## 2011-06-22 LAB — URINE CULTURE: Colony Count: >=100000

## 2011-06-22 LAB — COMPREHENSIVE METABOLIC PANEL
ALT: 28
AST: 50 — ABNORMAL HIGH
Albumin: 3.4 — ABNORMAL LOW
Alkaline Phosphatase: 181 — ABNORMAL HIGH
Calcium: 8.2 — ABNORMAL LOW
GFR calc Af Amer: 11 — ABNORMAL LOW
Potassium: 5.5 — ABNORMAL HIGH
Sodium: 115 — CL
Total Protein: 7.3

## 2011-06-22 LAB — DIFFERENTIAL
Basophils Absolute: 0
Basophils Relative: 0
Eosinophils Absolute: 0
Eosinophils Relative: 1
Lymphocytes Relative: 9 — ABNORMAL LOW
Lymphs Abs: 0.4 — ABNORMAL LOW
Monocytes Absolute: 0.5
Monocytes Relative: 11
Neutro Abs: 3.8
Neutrophils Relative %: 79 — ABNORMAL HIGH

## 2011-06-22 LAB — POCT I-STAT CREATININE
Creatinine, Ser: 5 — ABNORMAL HIGH
Operator id: 291361

## 2011-06-22 LAB — GLUCOSE, CAPILLARY: Glucose-Capillary: 303 mg/dL — ABNORMAL HIGH (ref 70–99)

## 2011-06-22 LAB — COMPREHENSIVE METABOLIC PANEL WITH GFR
BUN: 53 — ABNORMAL HIGH
CO2: 24
Chloride: 79 — CL
Creatinine, Ser: 5.28 — ABNORMAL HIGH
GFR calc non Af Amer: 9 — ABNORMAL LOW
Glucose, Bld: 1179
Total Bilirubin: 1.7 — ABNORMAL HIGH

## 2011-06-22 LAB — CBC
HCT: 34.1 — ABNORMAL LOW
HCT: 35.4 — ABNORMAL LOW
Hemoglobin: 11.3 — ABNORMAL LOW
Hemoglobin: 11.5 — ABNORMAL LOW
MCHC: 32.4
MCHC: 33.2
MCV: 93
Platelets: 205
RBC: 3.78 — ABNORMAL LOW
RBC: 3.81 — ABNORMAL LOW
RDW: 15.5 — ABNORMAL HIGH
RDW: 16 — ABNORMAL HIGH
WBC: 4.9

## 2011-06-22 LAB — LIPASE, BLOOD: Lipase: 18

## 2011-06-23 LAB — RENAL FUNCTION PANEL
Albumin: 2.9 — ABNORMAL LOW
CO2: 27
Calcium: 8.9
Creatinine, Ser: 4.98 — ABNORMAL HIGH
GFR calc Af Amer: 12 — ABNORMAL LOW
GFR calc non Af Amer: 10 — ABNORMAL LOW
Phosphorus: 4.8 — ABNORMAL HIGH

## 2011-06-23 LAB — TROPONIN I: Troponin I: 0.05

## 2011-06-23 LAB — CBC
MCHC: 33.1
Platelets: 241
RDW: 15.6 — ABNORMAL HIGH

## 2011-06-23 LAB — TSH: TSH: 3.781

## 2011-06-23 LAB — CK TOTAL AND CKMB (NOT AT ARMC)
CK, MB: 1.2
Total CK: 63

## 2011-06-24 LAB — I-STAT 8, (EC8 V) (CONVERTED LAB)
BUN: 71 — ABNORMAL HIGH
Chloride: 92 — ABNORMAL LOW
Glucose, Bld: >700
Hemoglobin: 15.3 — ABNORMAL HIGH
Potassium: 5.6 — ABNORMAL HIGH
Sodium: 118 — CL
pH, Ven: 7.284

## 2011-06-24 LAB — BASIC METABOLIC PANEL
BUN: 69 — ABNORMAL HIGH
BUN: 70 — ABNORMAL HIGH
CO2: 25
Calcium: 9
Calcium: 9.2
Chloride: 82 — ABNORMAL LOW
Chloride: 85 — ABNORMAL LOW
Creatinine, Ser: 4.1 — ABNORMAL HIGH
Creatinine, Ser: 5.49 — ABNORMAL HIGH
GFR calc Af Amer: 10 — ABNORMAL LOW
GFR calc Af Amer: 15 — ABNORMAL LOW
GFR calc non Af Amer: 12 — ABNORMAL LOW
Potassium: 5.4 — ABNORMAL HIGH
Sodium: 129 — ABNORMAL LOW

## 2011-06-24 LAB — URINALYSIS, ROUTINE W REFLEX MICROSCOPIC
Glucose, UA: >1000 — AB
Ketones, ur: 15 — AB
Leukocytes, UA: NEGATIVE
pH: 6.5

## 2011-06-24 LAB — CULTURE, BLOOD (ROUTINE X 2): Culture: NO GROWTH

## 2011-06-24 LAB — KETONES, QUALITATIVE

## 2011-06-24 LAB — CBC
Hemoglobin: 11.6 — ABNORMAL LOW
Hemoglobin: 12.6
MCHC: 32.1
MCHC: 33.1
RBC: 3.95
RBC: 4.36
RDW: 15.4 — ABNORMAL HIGH
WBC: 7.6

## 2011-06-24 LAB — CLOSTRIDIUM DIFFICILE EIA: C difficile Toxins A+B, EIA: NEGATIVE

## 2011-06-24 LAB — DIFFERENTIAL
Basophils Relative: 1
Lymphocytes Relative: 9 — ABNORMAL LOW
Lymphs Abs: 0.7
Monocytes Absolute: 0.5
Monocytes Relative: 7
Neutro Abs: 6.3
Neutrophils Relative %: 84 — ABNORMAL HIGH

## 2011-06-24 LAB — POCT I-STAT CREATININE
Creatinine, Ser: 5.6 — ABNORMAL HIGH
Operator id: 263631

## 2011-06-24 LAB — URINE MICROSCOPIC-ADD ON

## 2011-06-24 LAB — FECAL LACTOFERRIN, QUANT: Fecal Lactoferrin: POSITIVE

## 2011-06-24 LAB — URINE CULTURE

## 2011-06-26 IMAGING — CR DG CHEST 1V PORT
1 series · 1 of 1 positions shown · non-contrast
Comparison: 08/15/2009

CLINICAL DATA: Cough.  Hypertension.  Diabetes.

PORTABLE CHEST - 1 VIEW

[AP]
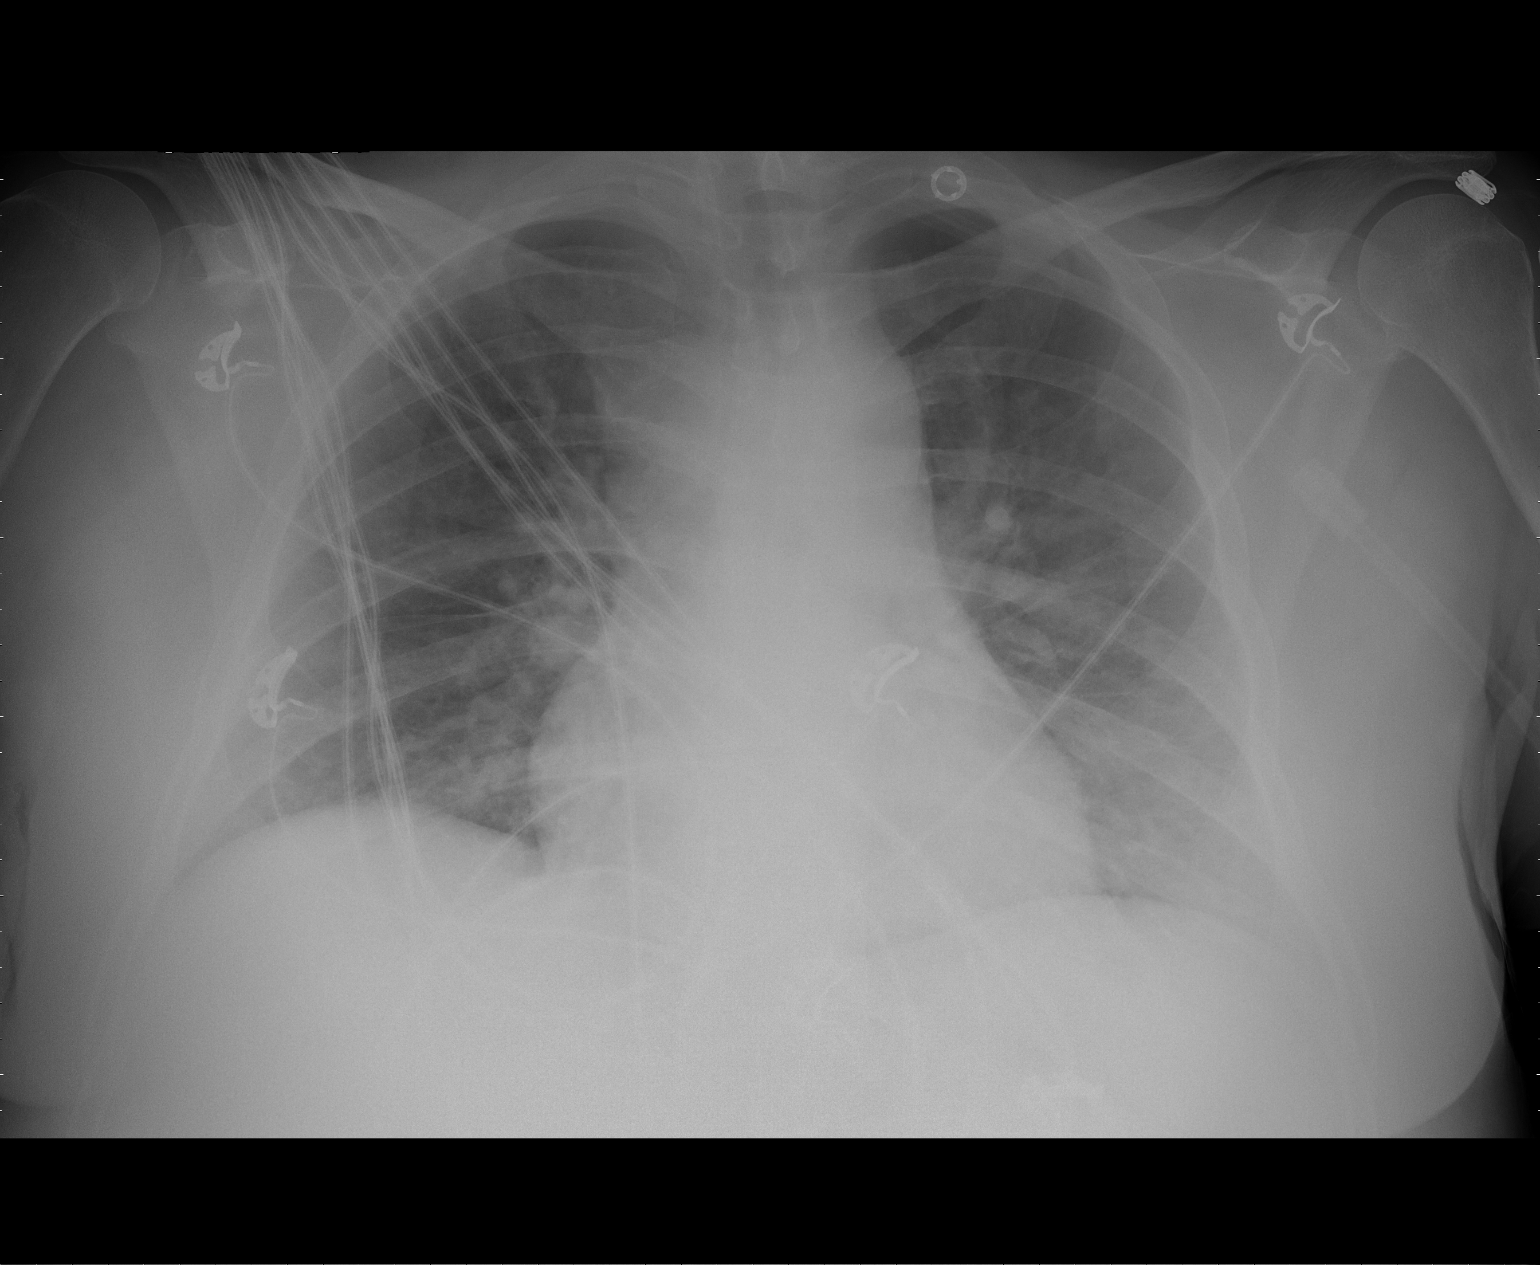

[1 of 1 positions shown; findings below may reference images not displayed]

FINDINGS: Low lung volumes are present, causing crowding of the
pulmonary vasculature.  The patient is rotated to the right on
today's exam, resulting in reduced diagnostic sensitivity and
specificity.

Hazy opacity at the left lung base is believed to be due to
rightward rotation causing superimposed thoracic wall soft tissues.
No definite airspace opacity is identified.

Cardiac and mediastinal contours appear unremarkable.
IMPRESSION: 1.  Low lung volumes.
2.  Hazy opacity peripherally at the left lung base is thought to
be due to the degree of rotation exacerbating superimposed thoracic
wall soft tissues. If the patient has abnormal breath sounds at the
left lung base then follow-up two-view chest radiography would be
recommended.

## 2011-07-13 ENCOUNTER — Encounter (HOSPITAL_BASED_OUTPATIENT_CLINIC_OR_DEPARTMENT_OTHER): Payer: Medicare Other | Attending: Internal Medicine

## 2011-07-21 IMAGING — CT CT HEAD W/O CM
2 series · 15 of 30 positions shown, 19 images · non-contrast
Comparison: 08/15/2009

CLINICAL DATA: Constant headache.  Confusion and blurred vision.

CT HEAD WITHOUT CONTRAST
TECHNIQUE: Contiguous axial images were obtained from the base of
the skull through the vertex without contrast.

[Series 2: head w/o · axial · non-contrast · 0.49mm/px · z∈[+29,+158]mm · 13 of 28 slices shown, 17 images]
[im 2/28  brain]
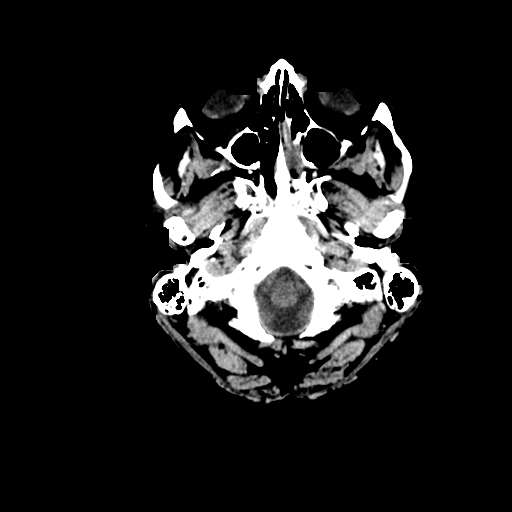
[im 2/28  bone]
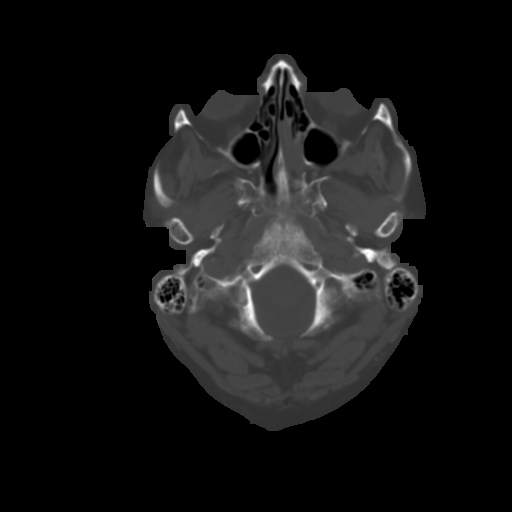
[im 4/28  brain]
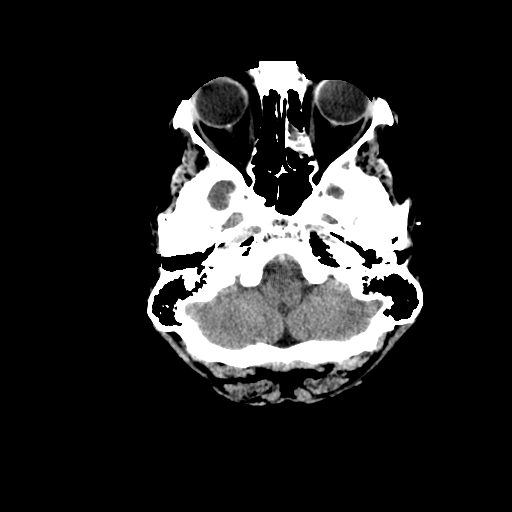
[im 6/28  brain]
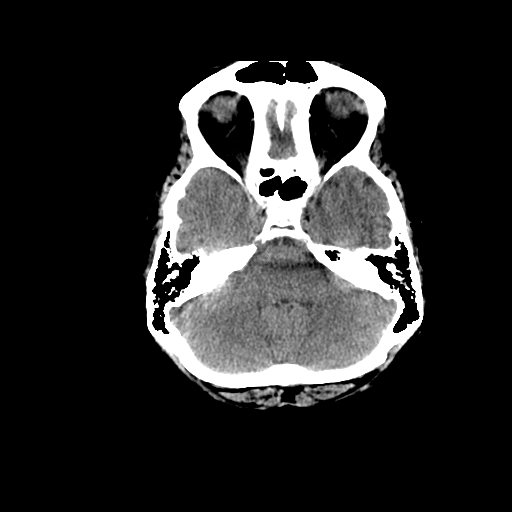
[im 8/28  brain]
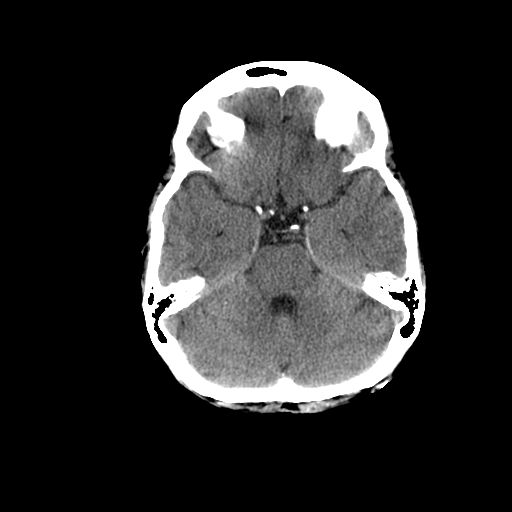
[im 10/28  brain]
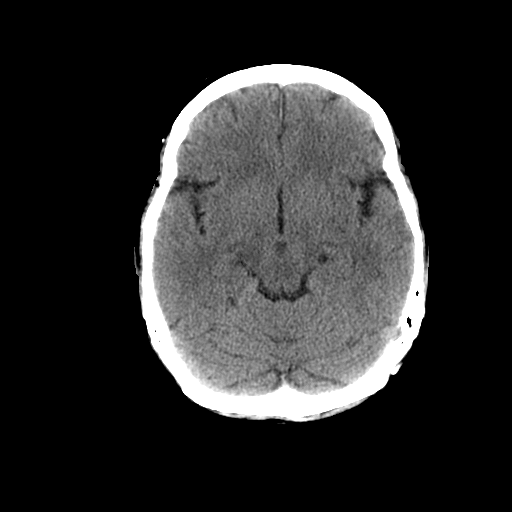
[im 10/28  bone]
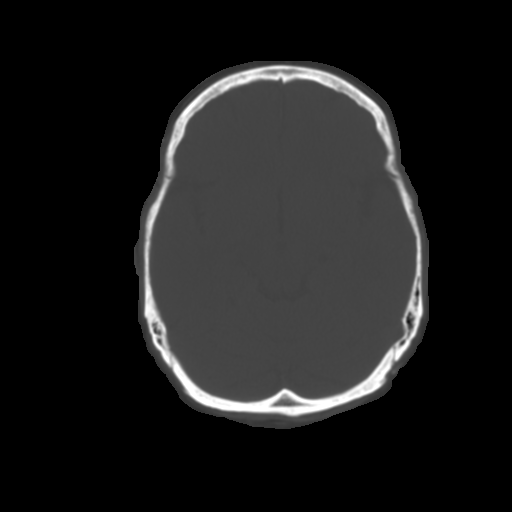
[im 12/28  brain]
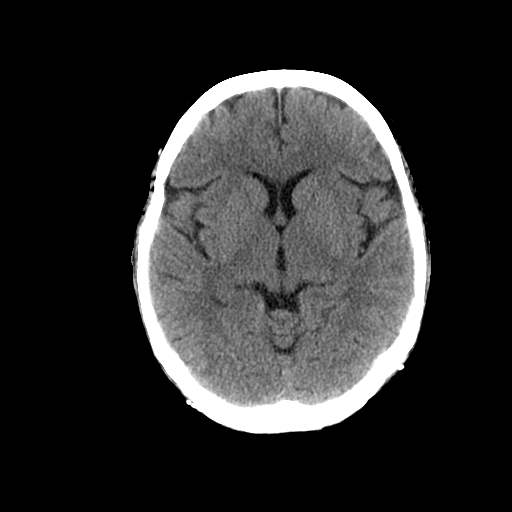
[im 14/28  brain]
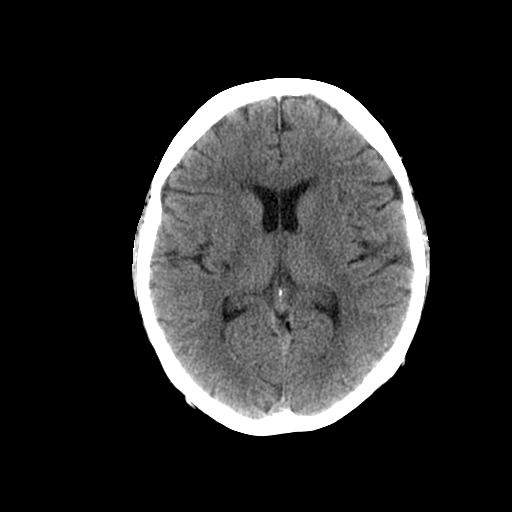
[im 16/28  brain]
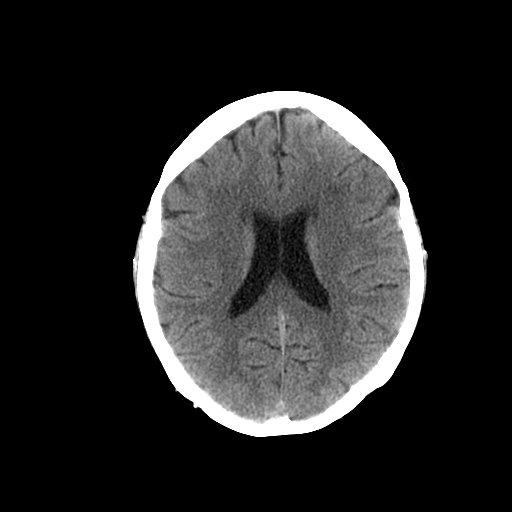
[im 18/28  brain]
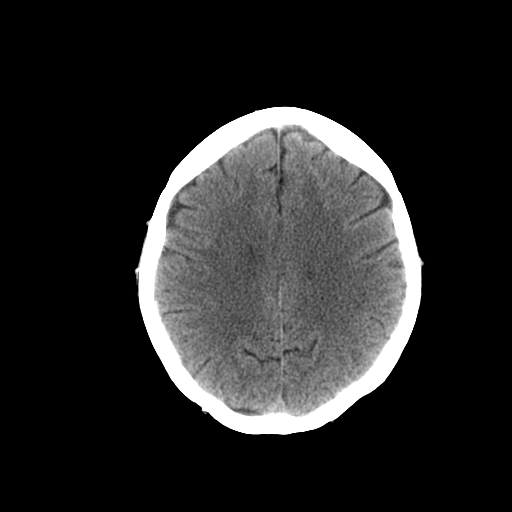
[im 18/28  bone]
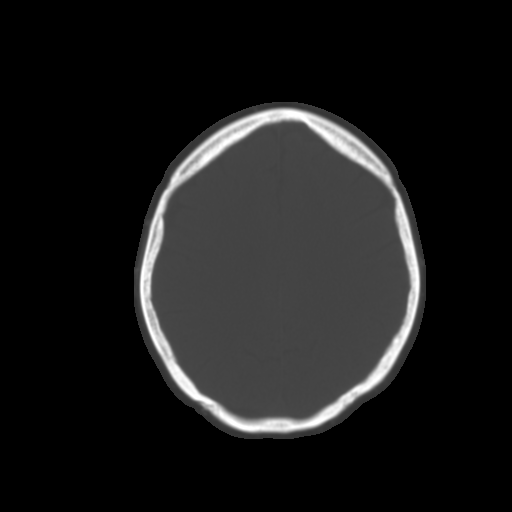
[im 20/28  brain]
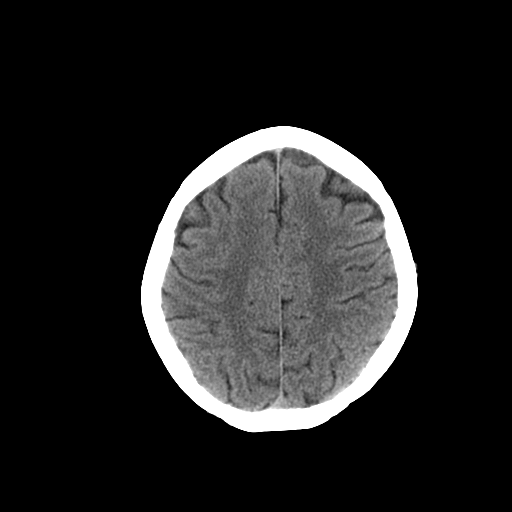
[im 22/28  brain]
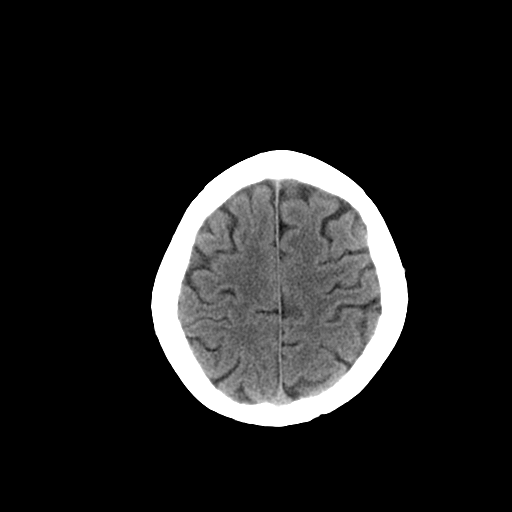
[im 24/28  brain]
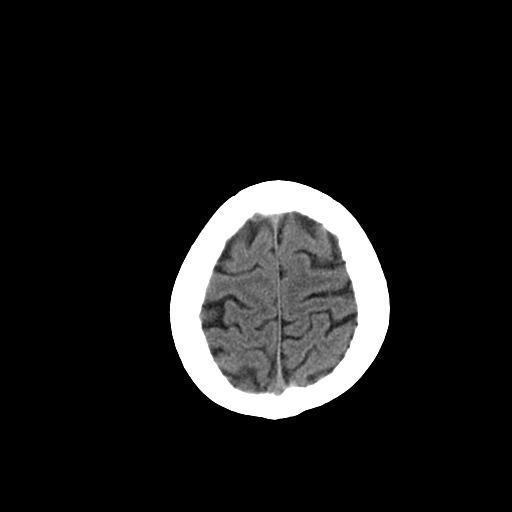
[im 26/28  brain]
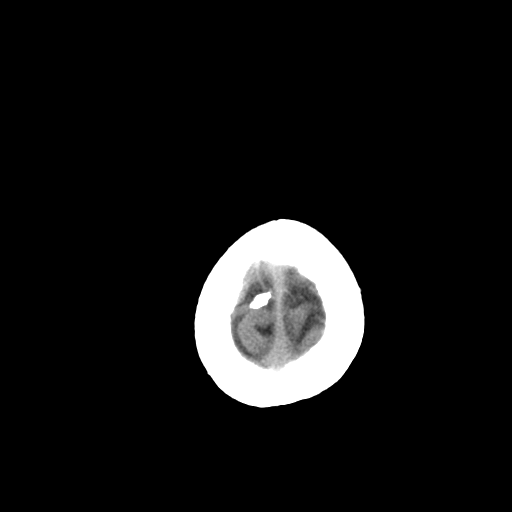
[im 26/28  bone]
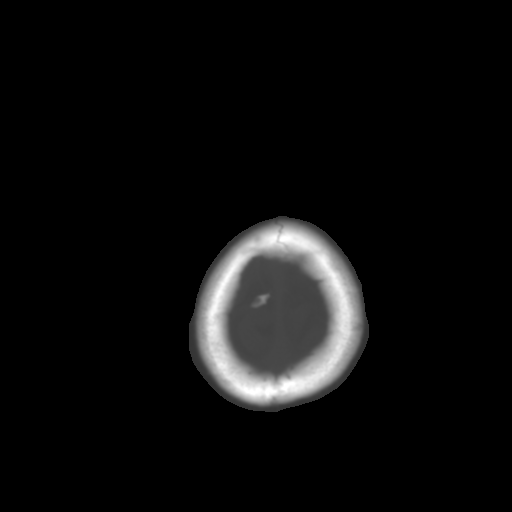

[Series 3: head bone · axial · 0.49mm/px · z∈[+29,+51]mm · 2 of 28 slices shown]
[im 2/28  bone]
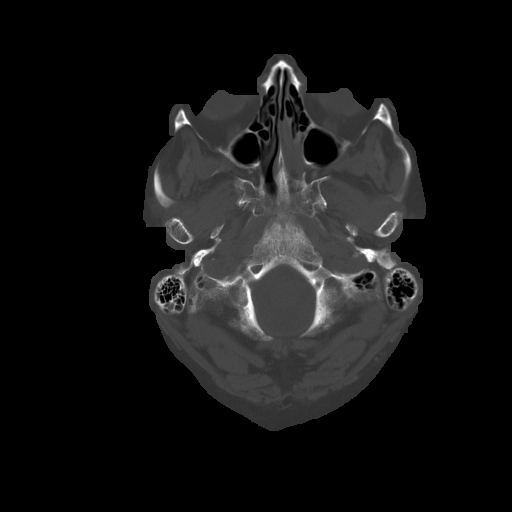
[im 6/28  bone]
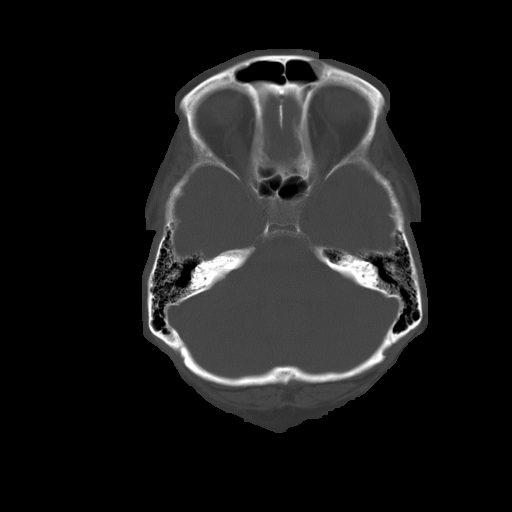

[15 of 30 positions shown; findings below may reference images not displayed]

FINDINGS: In the white matter posterior to the right lentiform
nucleus on image 14 of series #2 there is a small hypodensity
compatible with chronic lacunar infarct.

Mild periventricular and corona radiata white matter hypodensities
are most compatible with chronic ischemic microvascular white
matter disease.

No intracranial hemorrhage, mass lesion, or acute infarction is
identified.

There is complete opacification of several there is a left sided
ethmoid air cells compatible with left ethmoid sinusitis.  Minimal
left frontal sinusitis noted.  There is considerable cerumen in the
left external auditory canal.
IMPRESSION: 1.  Stable appearance the brain, with left ethmoid sinusitis and a
small hypodensity in the white matter just posterior to the right
lentiform nucleus which could represent a dilated perivascular
space or a small lacunar infarct.
2.  Minimal left frontal sinusitis.
3.  If headaches persist despite conservative therapy, MRI may be
helpful.

## 2011-08-17 IMAGING — XA CT PELV - CT LOW EXTREM*L* W/ CM
1 series · 14 of 24 positions shown · IV contrast (IODINE)
Comparison: 01/19/2008

CLINICAL DATA: end stage renal disease, low flows during dialysis

DIALYSIS SHUNTOGRAM
VENOUS ANGIOPLASTY
TECHNIQUE: An 18-gauge angiocatheter was placed   antegrade into
the arterial limb of the patient's right forearm synthetic loopAV
hemodialysis fistula for dialysis fistulography. The angiocatheter
and surrounding skin were then prepped with Betadine, draped in
usual sterile fashion, infiltrated locally with 1% lidocaine. The
angiocatheter was exchanged over a Benson wire for a 6 French
vascular sheath, through which a 7 mm x 4 cm Conquest angioplasty
balloon was advanced to the level of   intragraft and venous
anastomotic tandem stenoses  for venous angioplasty using 60
second  overlapping inflations at 30 atmospheres. After follow-up
venography, the catheter, sheath, and guidewire were removed and
hemostasis achieved with a 2-0 Ethilon purse-string suture. No
immediate complication.

[Series 300: sp av dialysis shunt intro needle *r* · 14 of 27 slices shown]
[im 1/27]
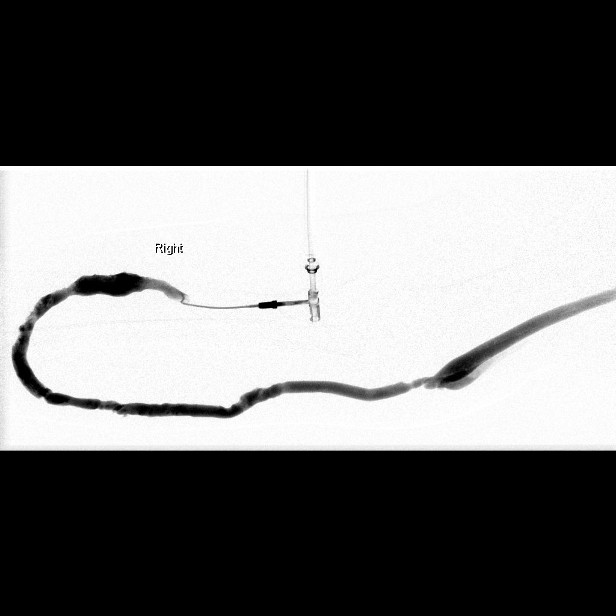
[im 3/27]
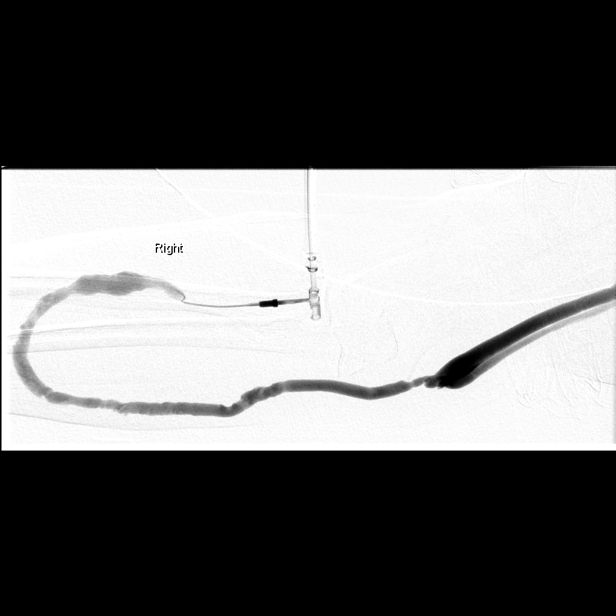
[im 5/27]
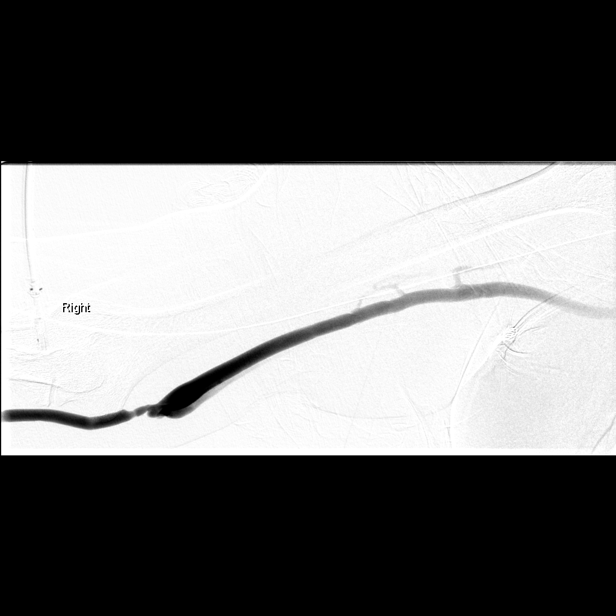
[im 7/27]
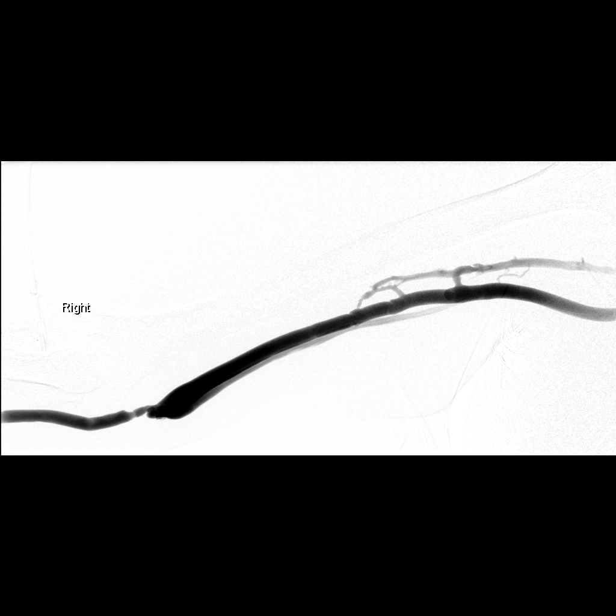
[im 8/27]
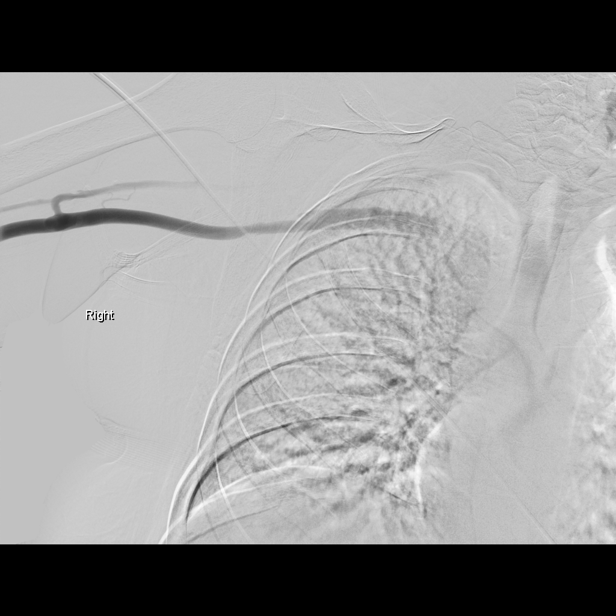
[im 11/27]
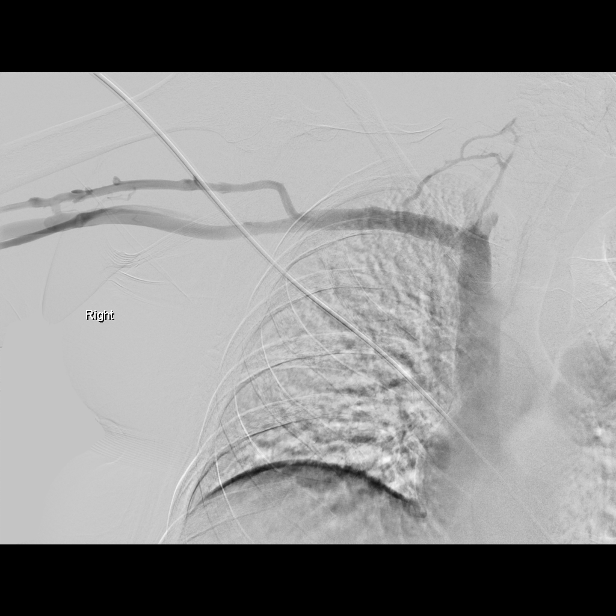
[im 13/27]
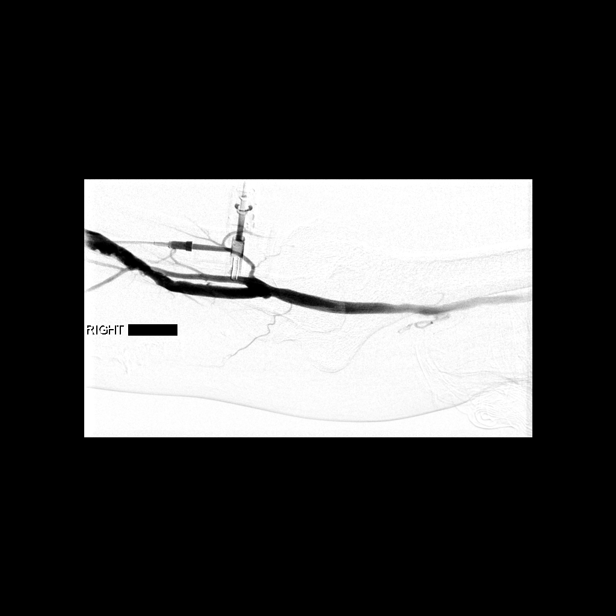
[im 14/27]
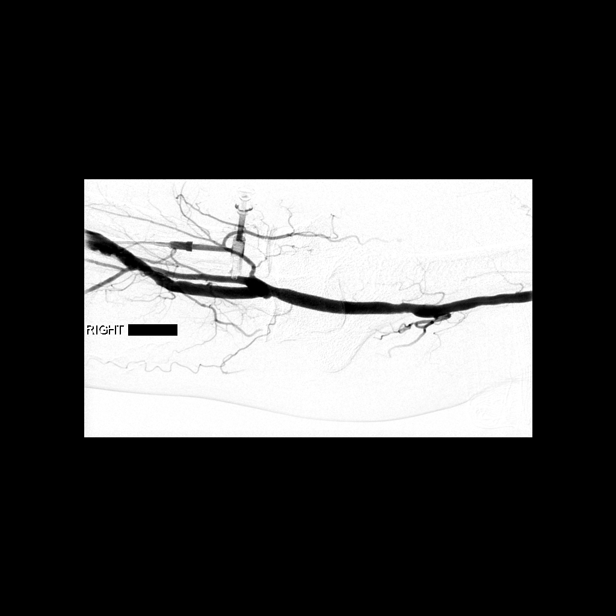
[im 16/27]
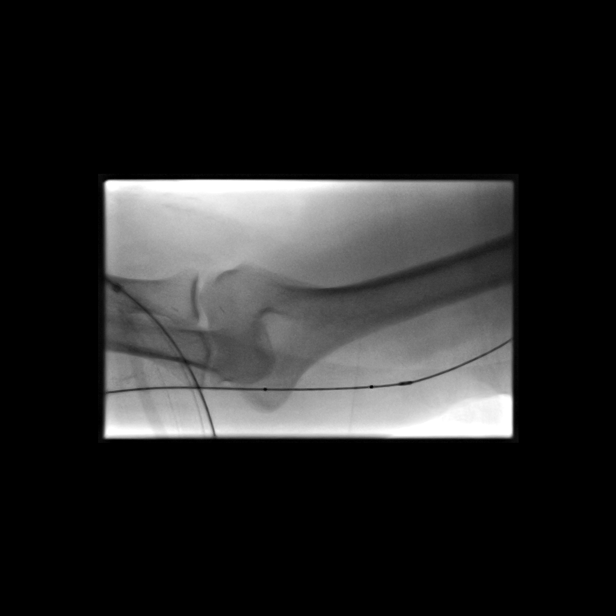
[im 19/27]
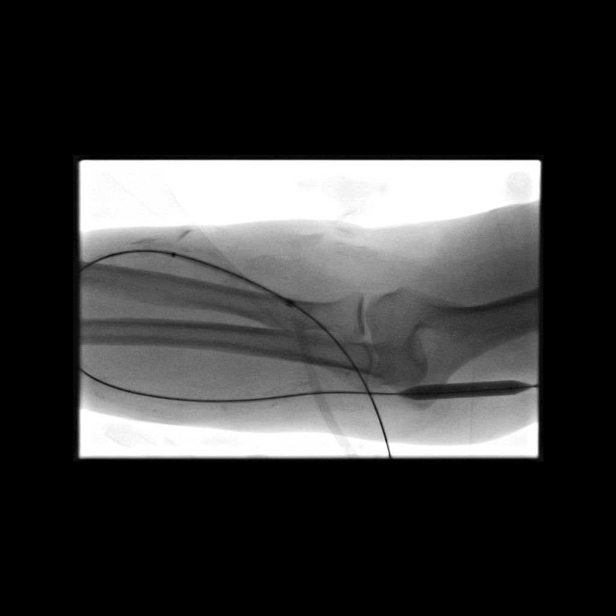
[im 21/27]
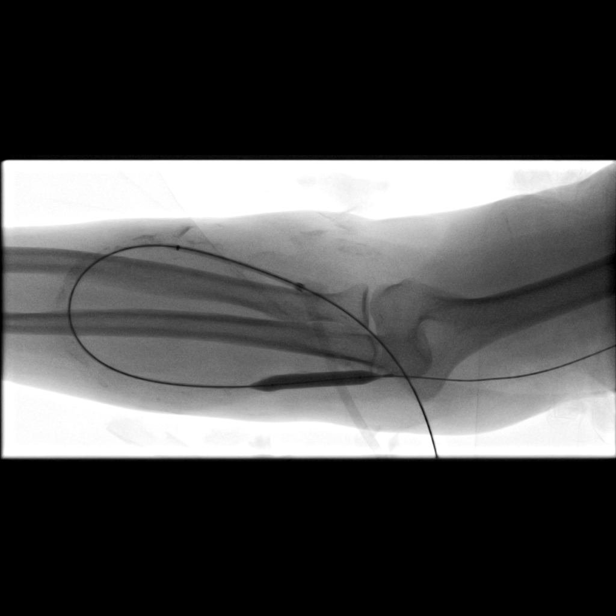
[im 22/27]
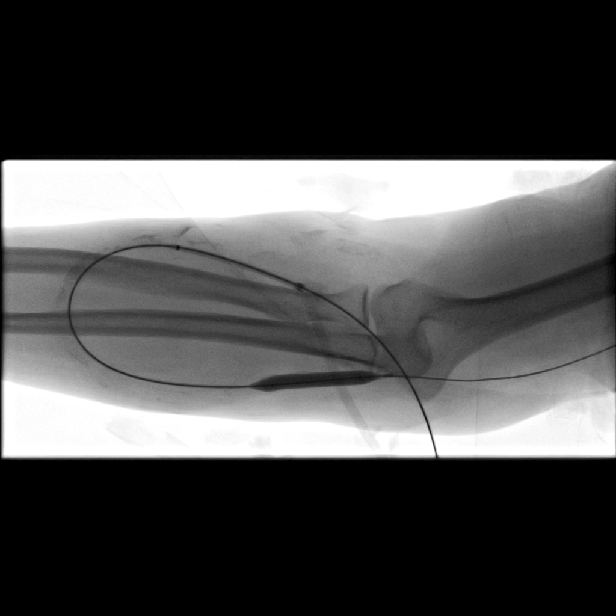
[im 24/27]
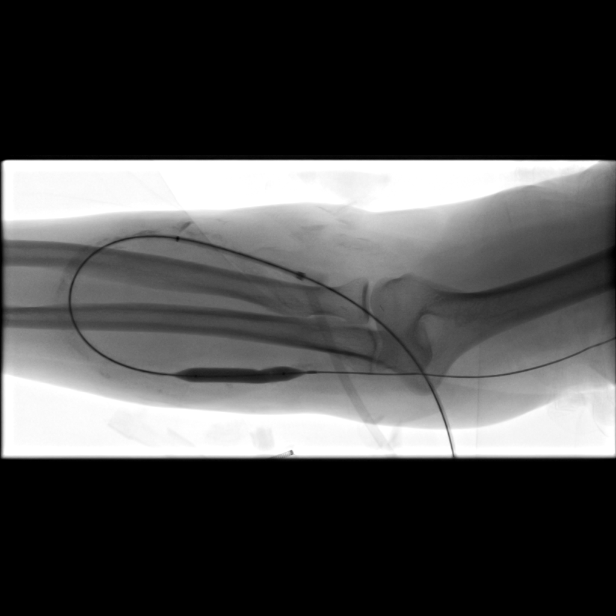
[im 27/27]
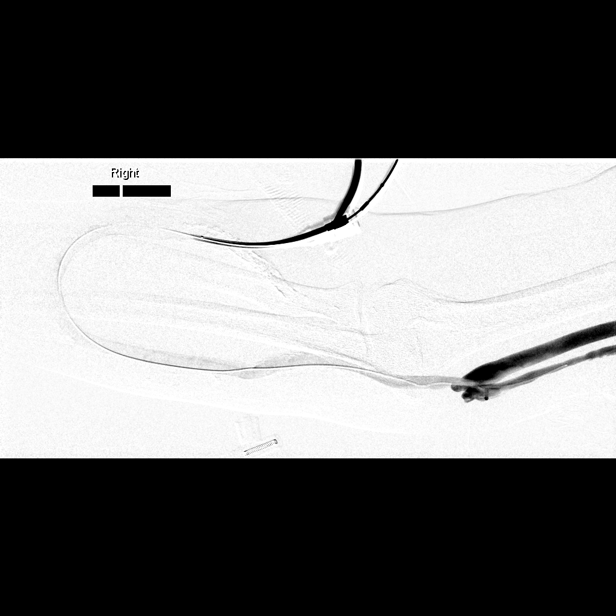

[14 of 24 positions shown; findings below may reference images not displayed]

FINDINGS: Induced reflux across the arterial anastomosis with the
brachial artery is unremarkable.  There is diffuse irregularity and
narrowing of the arterial and venous limbs of the graft with focal
areas of calcification adjacent to the graft as well as scattered
areas of   mild-moderate   narrowing at the apex and in the venous
limb.  There is a short segment venous anastomotic stenosis
spanning a length of less than 15 mm resulting in at least 75%
diameter narrowing.  The native venous outflow through the SVC is
widely patent.

Intragraft and venous anastomotic stenoses responded well to 7 mm
balloon angioplasty.  Final shuntogram shows no residual or
recurrent stenosis, extravasation, dissection, or other apparent
complication.

IMPRESSION

1.  Intragraft and venous anastomotic stenoses, with good response
to 7 mm balloon angioplasty.

Access management: Remains amenable to percutaneous intervention as
needed.

## 2011-08-23 IMAGING — XA IR AV DIALYSIS GRAFT DECLOT
1 series · 12 of 24 positions shown · non-contrast
Comparison: none

CLINICAL DATA: Occluded right forearm synthetic loop hemodialysis
graft.  Previous venous angioplasty 10/29/2009.

DIALYSIS GRAFT DECLOT
VENOUS ANGIOPLASTY
ULTRASOUND GUIDANCE FOR VASCULAR ACCESS X2
TECHNIQUE: The arterial limb of the graft was accessed antegrade
with 21-gauge micropuncture needle under real-time ultrasonic
guidance after the overlying skin prepped with Betadine, draped in
usual sterile fashion, infiltrated locally with 1% lidocaine.
Needle exchanged over 018 guidewire for transitional dilator
through which 1 mg t-PA was administered. In similar fashion, the
venous limb was accessed retrograde under ultrasound with a
micropuncture needle, exchanged for a transitional dilator for
administration of 1 mg t-PA. Ultrasound images were stored. Through
the antegrade dilator, a Bentson wire was advanced to the venous
anastomosis. Over this a 6F sheath was placed, through which a 5
French Kumpe catheter was advanced for outflow venography. This
showed patency of the outflow  venous system through the SVC.
Intravenous Fentanyl and Versed were administered as conscious
sedation during continuous cardiorespiratory monitoring by the
radiology RN, with a total moderate sedation time of 42 minutes. .
4222 units heparin were administered IV. The Kumpe was exchanged
over guidewire for a 7 mm x 4 cm Conquest angioplasty balloon, used
to dilated venous anastomotic stenosis as well as a intragraft
recurrent stenoses. The balloon catheter was exchanged for the
AngioJet device, used to mechanically thrombolyse the graft and
remove the thrombus from the native outflow.     Injection showed
clearance of thrombus from the graft. No extravasation or apparent
complication of angioplasty.  The venous limb dilator was exchanged
in similar fashion for a 6 French vascular sheath. The 7 mm
Conquest angioplasty balloon was used to dilate stenoses in the
arterial limb of the graft.  The Fogarty catheter   was advanced
over a Bentson wire across the arterial anastomosis and used to
dislodge the platelet plug from the arterial anastomosis.
Followup shuntogram showed   good flow through the outflow vein, no
extravasation. Residual stenosis or mural thrombus in the venous
limb of the graft was successfully treated with 7 mm balloon
angioplasty. Reflux across the arterial anastomosis showed this to
be widely patent with unremarkable native arterial circulation.
The catheter and sheaths were then removed and hemostasis achieved
with 2-0 Ethilon sutures. Patient tolerated procedure well.

[Series 1: run · 12 of 30 slices shown]
[im 2/30]
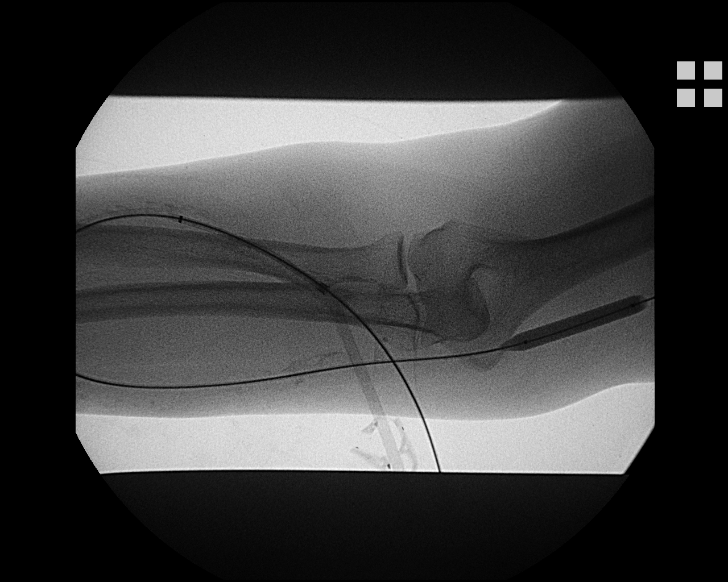
[im 4/30]
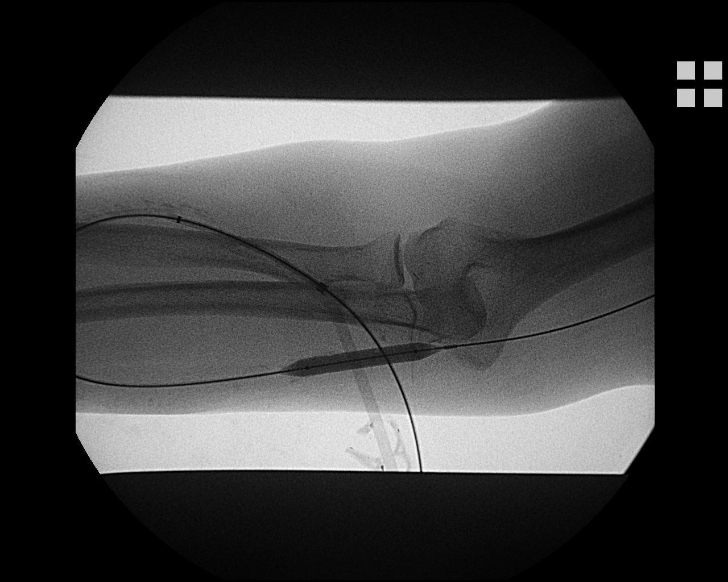
[im 7/30]
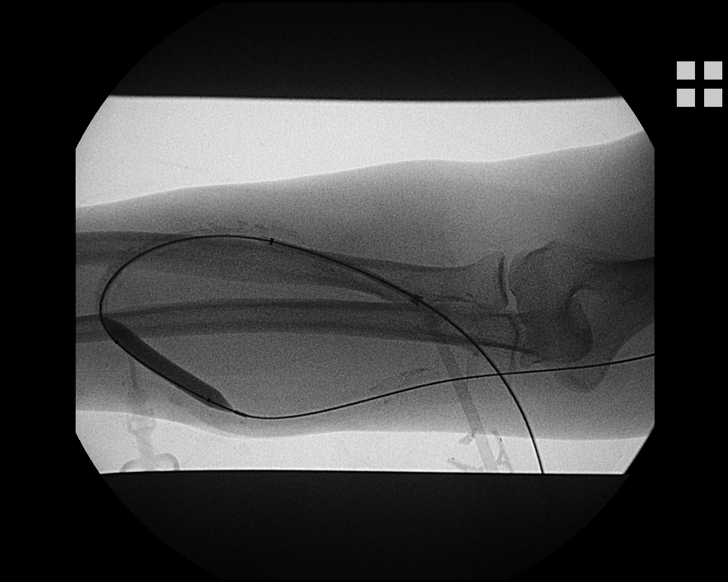
[im 9/30]
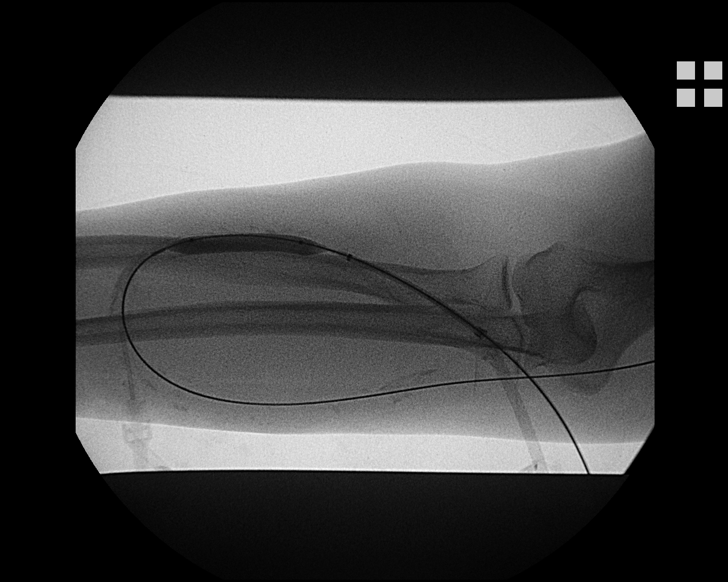
[im 12/30]
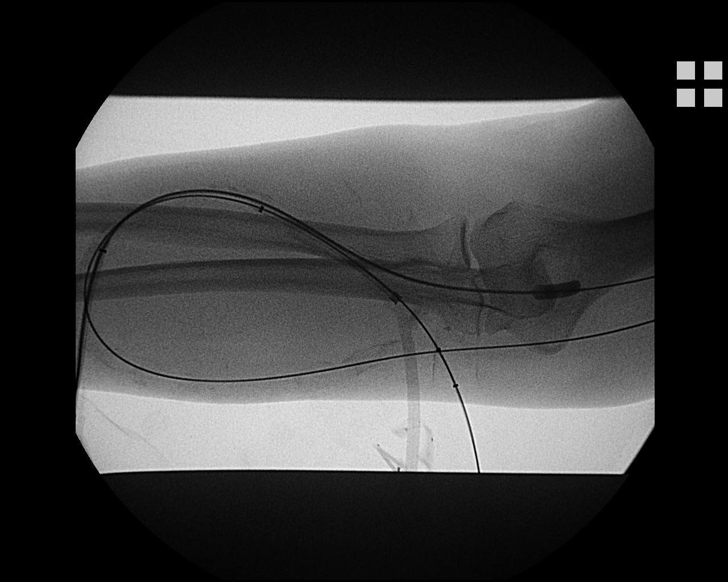
[im 14/30]
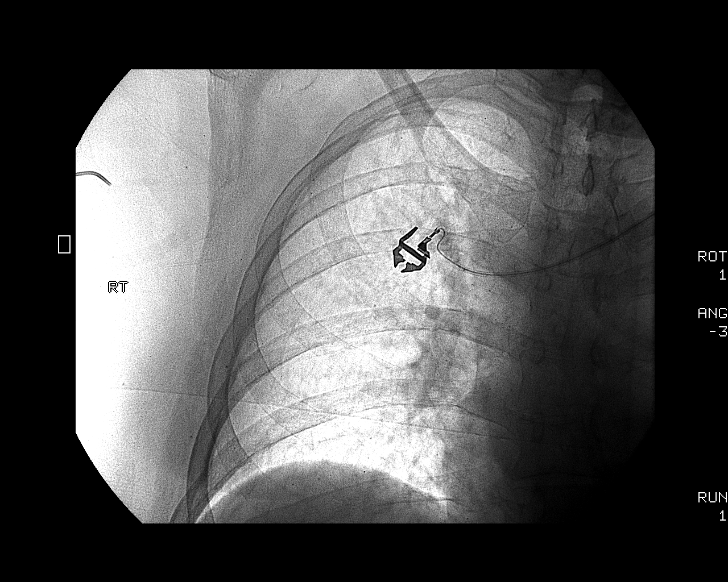
[im 17/30]
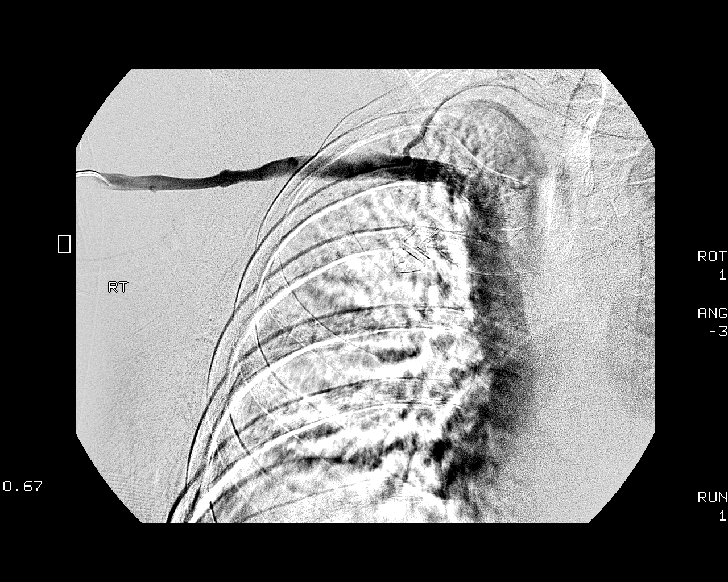
[im 19/30]
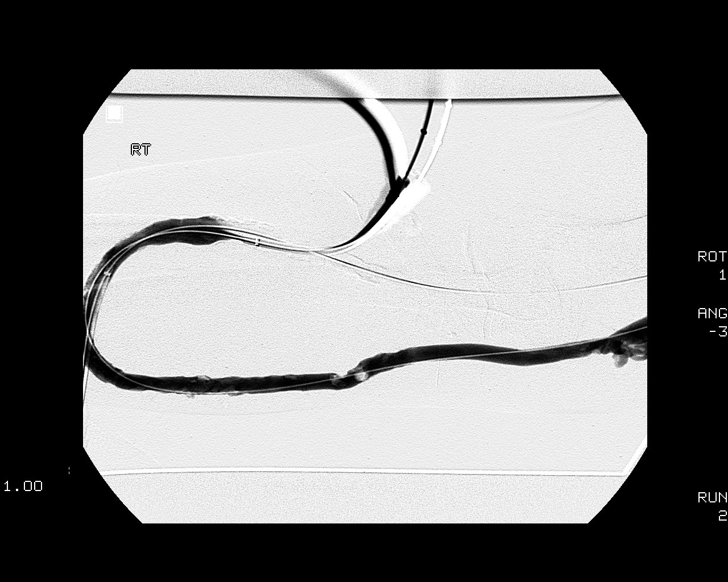
[im 22/30]
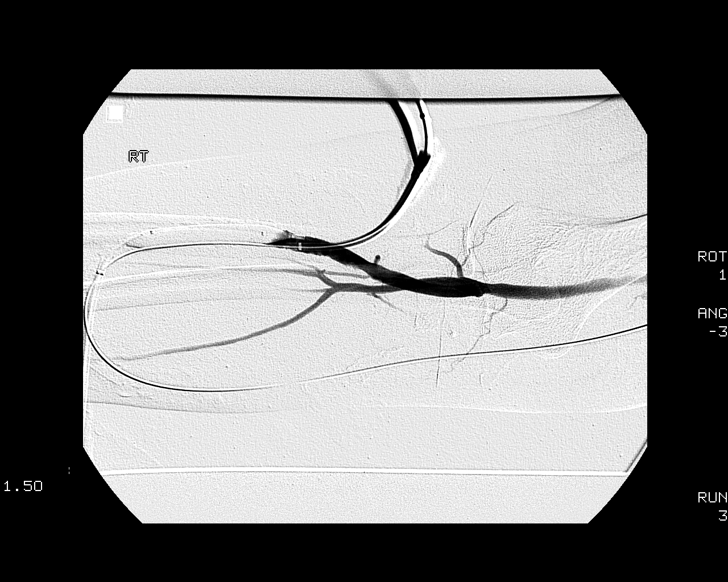
[im 24/30]
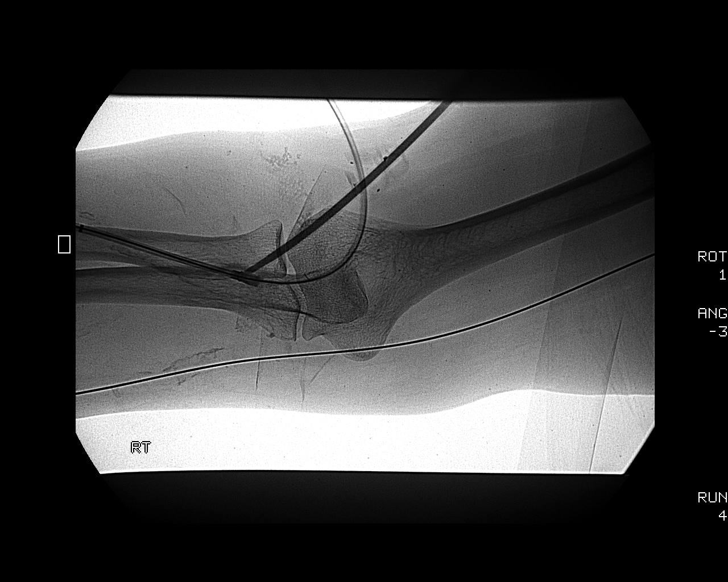
[im 27/30]
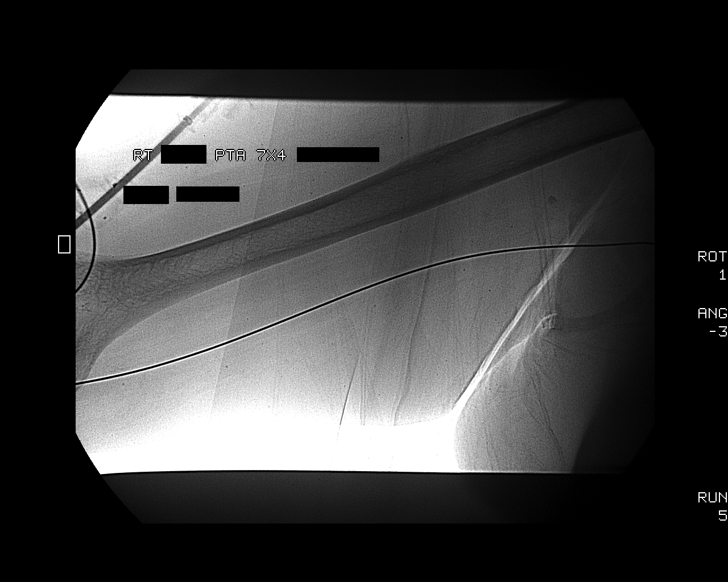
[im 30/30]
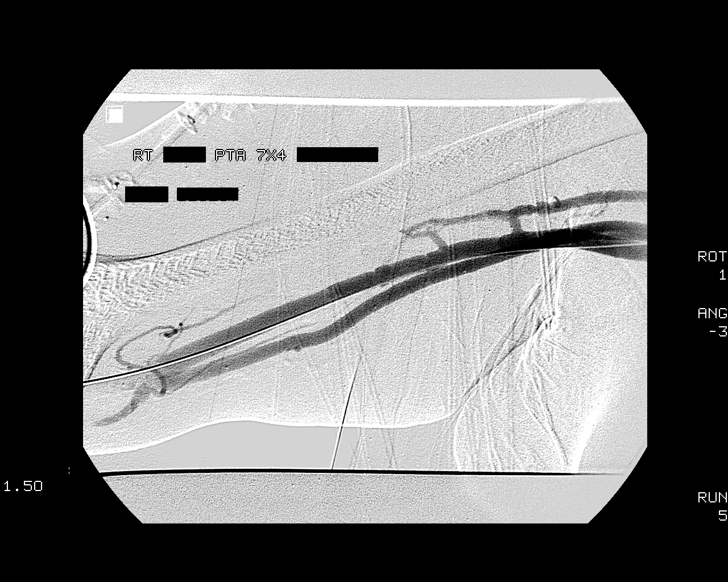

[12 of 24 positions shown; findings below may reference images not displayed]

IMPRESSION: 1. Technically successful declot of right to forearm synthetic loop
hemodialysis graft.

2. Technically successful balloon angioplasty of recurrent
intragraft and venous anastomotic stenoses.

Access management: Due to the short-term recurrence of   intragraft
and venous anastomotic stenoses in the setting of a fairly heavily
calcified dialysis graft, I would recommend   surgical revision
should the patient suffer early graft reocclusion or flow issues.

## 2011-09-13 ENCOUNTER — Emergency Department (HOSPITAL_COMMUNITY)
Admission: EM | Admit: 2011-09-13 | Discharge: 2011-09-13 | Disposition: A | Discharge status: Home / Self Care | Payer: Medicare Other | Attending: Emergency Medicine | Admitting: Emergency Medicine

## 2011-09-13 ENCOUNTER — Encounter (HOSPITAL_COMMUNITY): Payer: Self-pay | Admitting: *Deleted

## 2011-09-13 ENCOUNTER — Emergency Department (HOSPITAL_COMMUNITY): Payer: Medicare Other

## 2011-09-13 DIAGNOSIS — E039 Hypothyroidism, unspecified: Secondary | ICD-10-CM | POA: Insufficient documentation

## 2011-09-13 DIAGNOSIS — M545 Low back pain, unspecified: Secondary | ICD-10-CM | POA: Insufficient documentation

## 2011-09-13 DIAGNOSIS — E119 Type 2 diabetes mellitus without complications: Secondary | ICD-10-CM | POA: Insufficient documentation

## 2011-09-13 DIAGNOSIS — Z794 Long term (current) use of insulin: Secondary | ICD-10-CM | POA: Insufficient documentation

## 2011-09-13 DIAGNOSIS — M79609 Pain in unspecified limb: Secondary | ICD-10-CM | POA: Insufficient documentation

## 2011-09-13 DIAGNOSIS — N186 End stage renal disease: Secondary | ICD-10-CM | POA: Insufficient documentation

## 2011-09-13 DIAGNOSIS — Z992 Dependence on renal dialysis: Secondary | ICD-10-CM | POA: Insufficient documentation

## 2011-09-13 DIAGNOSIS — I120 Hypertensive chronic kidney disease with stage 5 chronic kidney disease or end stage renal disease: Secondary | ICD-10-CM | POA: Insufficient documentation

## 2011-09-13 LAB — GLUCOSE, CAPILLARY: Glucose-Capillary: 395 mg/dL — ABNORMAL HIGH (ref 70–99)

## 2011-09-13 MED ORDER — DIAZEPAM 5 MG PO TABS: 5.0000 mg | ORAL_TABLET | Freq: Three times a day (TID) | ORAL | Status: AC | PRN

## 2011-09-13 MED ORDER — OXYCODONE-ACETAMINOPHEN 5-325 MG PO TABS
2.0000 | ORAL_TABLET | Freq: Once | ORAL | Status: AC
  Administered 2011-09-13: 2 via ORAL
  Filled 2011-09-13: qty 2

## 2011-09-13 MED ORDER — OXYCODONE-ACETAMINOPHEN 5-325 MG PO TABS
1.0000 | ORAL_TABLET | Freq: Four times a day (QID) | ORAL | Status: AC | PRN
Start: 2011-09-13 — End: 2011-09-23

## 2011-09-13 NOTE — ED Provider Notes (Signed)
Medical screening examination/treatment/procedure(s) were performed by non-physician practitioner and as supervising physician I was immediately available for consultation/collaboration.  Misako Roeder M Latora Quarry, MD 09/13/11 2119 

## 2011-09-13 NOTE — ED Provider Notes (Signed)
History     CSN: [id number]  Arrival date & time 09/13/11  4098   First MD Initiated Contact with Patient 09/13/11 1016      Chief Complaint  Patient presents with  . Back Pain    (Consider location/radiation/quality/duration/timing/severity/associated sxs/prior treatment) The history is provided by the patient.   the patient is a 46 year old female with a history of lumbar back surgery approximately 15 years ago who presents with lumbar back pain x1-2 days with no new injury. Intermittent radiation down the lateral aspect of bilateral legs. The pain started while performing her daily activities around the house. It alternates between throbbing and sharp and is severe and constant. It is worse with lying down on her back or side. There is no associated fever, chills, incontinence, saddle anesthesia, new numbness or weakness to the lower extremities or rash.  Past Medical History  Diagnosis Date  . Diabetes mellitus   . Hypertension   . Kidney disease   . Hypothyroid   . Depression   . End stage renal disease     right arm AV graft, post transplant  . 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 Back surgery 2001  . Tubal ligation   . Dg av dialysis graft declot or   . Nephrectomy transplanted organ     Family History  Problem Relation Age of Onset  . Emphysema Mother   . Emphysema Father   . Throat cancer Mother     History  Substance Use Topics  . Smoking status: Former Smoker    Types: Cigarettes  . Smokeless tobacco: Never Used  . Alcohol Use: No     Review of Systems 10 systems reviewed and are negative for acute change except as noted in the HPI.  Allergies  Ace inhibitors and Adhesive  Home Medications   Current Outpatient Rx  Name Route Sig Dispense Refill  . AMLODIPINE BESYLATE 5 MG PO TABS Oral Take 5 mg by mouth daily.      [name] CARVEDILOL 25 MG PO TABS Oral Take 50 mg by mouth 2 (two) times  daily.     . INSULIN ASPART 100 UNIT/ML [place] SOLN Subcutaneous Inject 8 Units into the skin 2 (two) times daily before a meal.     . INSULIN GLARGINE 100 UNIT/ML  SOLN Subcutaneous Inject 40 Units into the skin 2 (two) times daily.     [name] LEVOTHYROXINE SODIUM 137 MCG PO TABS Oral Take 137 mcg by mouth daily. Name brand    . MYCOPHENOLATE MOFETIL 250 MG PO CAPS Oral Take 500 mg by mouth 2 (two) times daily.     [name] PREDNISONE 5 MG PO TABS Oral Take 5 mg by mouth daily.      . TYLENOL COLD/FLU SEVERE DAY PO Oral Take by mouth.      [name] RANITIDINE HCL 150 MG PO CAPS Oral Take 150 mg by mouth daily.     [name] TACROLIMUS 1 MG PO CAPS Oral Take 1 mg by mouth 2 (two) times daily.       BP 116/75  Pulse 81  Temp(Src) 97.5 F (36.4 C) (Oral)  Resp 18  SpO2 97%  Physical Exam  Nursing note and vitals reviewed. Constitutional: She is oriented to person, place, and time. She appears well-developed and well-nourished.       Uncomfortable appearing   HENT:  Head: Normocephalic and atraumatic.  Right Ear: External ear normal.  Left Ear: External ear normal.  Mouth/Throat: Oropharynx is clear and moist.  Eyes: Pupils are equal, round, and reactive to light.  Neck: Normal range of motion. Neck supple.  Cardiovascular: Normal rate and regular rhythm.   Pulmonary/Chest: Effort normal. No respiratory distress.  Abdominal: Soft. She exhibits no distension. There is no tenderness.  Musculoskeletal: She exhibits no edema.       Tenderness to palpation over midline lumbar and bilateral paralumbar regions without any overlying skin changes. Well-healed surgical scar to the inferior lumbar region. Bilateral lower extremities are nontender to palpation without any rash. Full range of motion in lower extremities with intact DP pulses. Capillary refill less than 2 seconds. Decreased sensation to light touch in feet secondary to chronic neuropathy, baseline per patient. DTRs are symmetric and intact bilaterally. 5/5  strength of hip flexion and extension, knee flexion extension, foot dorsi and plantar flexion bilaterally.  Neurological: She is alert and oriented to person, place, and time.       Slight gait antalgia without any limp or ataxia.  Skin: Skin is warm and dry. No pallor.  Psychiatric: She has a normal mood and affect.    ED Course  Procedures (including critical care time)  Labs Reviewed  GLUCOSE, CAPILLARY - Abnormal; Notable for the following:    Glucose-Capillary 395 (*)    All other components within normal limits  POCT CBG MONITORING   Dg Lumbar Spine Complete  09/13/2011  *RADIOLOGY REPORT*  Clinical Data: Low back pain, prior back surgery  LUMBAR SPINE - COMPLETE 4+ VIEW  Comparison: 03/08/2007  Findings: Five lumbar-type vertebral bodies.  No evidence of fracture or dislocation.  Vertebral body heights are maintained.  Mild multilevel degenerative changes, most prominent at L4-5.  Vascular calcifications.  IMPRESSION: No fracture or dislocation is seen.  Original Report Authenticated By: Charline Bills, M.D.     Dx 1: Lumbar back pain Dx 2: DM   MDM  Back pain with no known injury. No back pain red flags. X-ray was ordered for evaluation of bony structures given midline tenderness and history of prior surgery. X-rays reviewed with no acute findings. Patient is not a candidate for NSAID therapy given her history of renal transplant. Will provide prescriptions for pain medication and muscle relaxer to use as needed.   Patient also with poorly controlled diabetes. Requested that her blood sugar be checked as she forgot to check before she came to the emergency department today. Blood sugar was 395. In the setting of poorly controlled diabetes with no polyuria, polydipsia, nausea, vomiting, abdominal pain, altered mental status there is no additional emergency department testing or treatment needed-the patient will take her at home insulin when she gets home and will continue to  monitor her blood sugar and followup with her regular doctor closely.        Elwyn Reach [place], [place] 09/13/11 1257

## 2011-09-13 NOTE — ED Notes (Signed)
Pt reports lower back pain midline x's 1 days. Denies injury. Back surgery approx 58yrs ago

## 2011-09-22 ENCOUNTER — Other Ambulatory Visit: Payer: Self-pay

## 2011-09-22 ENCOUNTER — Encounter (HOSPITAL_COMMUNITY): Payer: Self-pay | Admitting: Emergency Medicine

## 2011-09-22 ENCOUNTER — Inpatient Hospital Stay (HOSPITAL_COMMUNITY)
Admission: EM | Admit: 2011-09-22 | Discharge: 2011-09-26 | DRG: 637 | Disposition: A | Discharge status: Home / Self Care | Payer: Medicare Other | Attending: Internal Medicine | Admitting: Internal Medicine

## 2011-09-22 DIAGNOSIS — E039 Hypothyroidism, unspecified: Secondary | ICD-10-CM | POA: Insufficient documentation

## 2011-09-22 DIAGNOSIS — E1010 Type 1 diabetes mellitus with ketoacidosis without coma: Principal | ICD-10-CM | POA: Diagnosis present

## 2011-09-22 DIAGNOSIS — Z940 Kidney transplant status: Secondary | ICD-10-CM

## 2011-09-22 DIAGNOSIS — E875 Hyperkalemia: Secondary | ICD-10-CM | POA: Diagnosis present

## 2011-09-22 DIAGNOSIS — D849 Immunodeficiency, unspecified: Secondary | ICD-10-CM | POA: Diagnosis present

## 2011-09-22 DIAGNOSIS — E1110 Type 2 diabetes mellitus with ketoacidosis without coma: Secondary | ICD-10-CM

## 2011-09-22 DIAGNOSIS — N186 End stage renal disease: Secondary | ICD-10-CM | POA: Diagnosis present

## 2011-09-22 DIAGNOSIS — Z794 Long term (current) use of insulin: Secondary | ICD-10-CM

## 2011-09-22 DIAGNOSIS — F3289 Other specified depressive episodes: Secondary | ICD-10-CM | POA: Diagnosis present

## 2011-09-22 DIAGNOSIS — E108 Type 1 diabetes mellitus with unspecified complications: Secondary | ICD-10-CM | POA: Diagnosis present

## 2011-09-22 DIAGNOSIS — I10 Essential (primary) hypertension: Secondary | ICD-10-CM | POA: Diagnosis present

## 2011-09-22 DIAGNOSIS — L97809 Non-pressure chronic ulcer of other part of unspecified lower leg with unspecified severity: Secondary | ICD-10-CM | POA: Diagnosis present

## 2011-09-22 DIAGNOSIS — E860 Dehydration: Secondary | ICD-10-CM | POA: Diagnosis present

## 2011-09-22 DIAGNOSIS — I959 Hypotension, unspecified: Secondary | ICD-10-CM | POA: Diagnosis present

## 2011-09-22 DIAGNOSIS — F32A Depression, unspecified: Secondary | ICD-10-CM | POA: Insufficient documentation

## 2011-09-22 DIAGNOSIS — F329 Major depressive disorder, single episode, unspecified: Secondary | ICD-10-CM | POA: Insufficient documentation

## 2011-09-22 DIAGNOSIS — I120 Hypertensive chronic kidney disease with stage 5 chronic kidney disease or end stage renal disease: Secondary | ICD-10-CM | POA: Diagnosis present

## 2011-09-22 DIAGNOSIS — IMO0002 Reserved for concepts with insufficient information to code with codable children: Secondary | ICD-10-CM | POA: Diagnosis present

## 2011-09-22 DIAGNOSIS — E1065 Type 1 diabetes mellitus with hyperglycemia: Secondary | ICD-10-CM | POA: Diagnosis present

## 2011-09-22 DIAGNOSIS — E871 Hypo-osmolality and hyponatremia: Secondary | ICD-10-CM | POA: Diagnosis present

## 2011-09-22 LAB — BASIC METABOLIC PANEL
BUN: 37 mg/dL — ABNORMAL HIGH (ref 6–23)
Calcium: 9.7 mg/dL (ref 8.4–10.5)
Chloride: 96 mEq/L (ref 96–112)
Creatinine, Ser: 1.35 mg/dL — ABNORMAL HIGH (ref 0.50–1.10)
Creatinine, Ser: 1.3 mg/dL — ABNORMAL HIGH (ref 0.50–1.10)
GFR calc Af Amer: 54 mL/min — ABNORMAL LOW (ref >90)
GFR calc Af Amer: 57 mL/min — ABNORMAL LOW (ref >90)
GFR calc non Af Amer: 47 mL/min — ABNORMAL LOW (ref >90)
GFR calc non Af Amer: 47 mL/min — ABNORMAL LOW (ref >90)
Potassium: 6.3 mEq/L (ref 3.5–5.1)
Potassium: 5.8 mEq/L — ABNORMAL HIGH (ref 3.5–5.1)
Sodium: 130 mEq/L — ABNORMAL LOW (ref 135–145)
Sodium: 133 mEq/L — ABNORMAL LOW (ref 135–145)

## 2011-09-22 LAB — URINALYSIS, ROUTINE W REFLEX MICROSCOPIC
Bilirubin Urine: NEGATIVE
Ketones, ur: >80 mg/dL — AB
Nitrite: NEGATIVE
Protein, ur: NEGATIVE mg/dL
Urobilinogen, UA: 0.2 mg/dL (ref 0.0–1.0)

## 2011-09-22 LAB — GLUCOSE, CAPILLARY
Glucose-Capillary: >600 mg/dL (ref 70–99)
Glucose-Capillary: >600 mg/dL (ref 70–99)
Glucose-Capillary: 527 mg/dL — ABNORMAL HIGH (ref 70–99)
Glucose-Capillary: 392 mg/dL — ABNORMAL HIGH (ref 70–99)
Glucose-Capillary: 310 mg/dL — ABNORMAL HIGH (ref 70–99)

## 2011-09-22 LAB — CBC
HCT: 41.6 % (ref 36.0–46.0)
MCH: 28 pg (ref 26.0–34.0)
MCH: 27.9 pg (ref 26.0–34.0)
MCHC: 31.7 g/dL (ref 30.0–36.0)
MCHC: 33 g/dL (ref 30.0–36.0)
MCV: 88.3 fL (ref 78.0–100.0)
Platelets: 223 10*3/uL (ref 150–400)
RBC: 4.27 MIL/uL (ref 3.87–5.11)
RDW: 13.4 % (ref 11.5–15.5)

## 2011-09-22 LAB — BLOOD GAS, VENOUS
Acid-base deficit: 10.4 mmol/L — ABNORMAL HIGH (ref 0.0–2.0)
Bicarbonate: 16.9 mEq/L — ABNORMAL LOW (ref 20.0–24.0)
O2 Saturation: 44.8 %
Patient temperature: 98.6
TCO2: 15.8 mmol/L (ref 0–100)

## 2011-09-22 LAB — DIFFERENTIAL
Basophils Absolute: 0 10*3/uL (ref 0.0–0.1)
Basophils Relative: 0 % (ref 0–1)
Eosinophils Absolute: 0.1 10*3/uL (ref 0.0–0.7)
Eosinophils Relative: 1 % (ref 0–5)
Lymphocytes Relative: 6 % — ABNORMAL LOW (ref 12–46)
Monocytes Absolute: 0.7 10*3/uL (ref 0.1–1.0)

## 2011-09-22 LAB — URINE MICROSCOPIC-ADD ON

## 2011-09-22 MED ORDER — ENOXAPARIN SODIUM 40 MG/0.4ML ~~LOC~~ SOLN
40.0000 mg | SUBCUTANEOUS | Status: DC
  Administered 2011-09-22 – 2011-09-25 (×4): 40 mg via SUBCUTANEOUS
  Filled 2011-09-22 (×5): qty 0.4

## 2011-09-22 MED ORDER — SODIUM CHLORIDE 0.9 % IV SOLN: INTRAVENOUS | Status: AC

## 2011-09-22 MED ORDER — MUPIROCIN 2 % EX OINT
1.0000 "application " | TOPICAL_OINTMENT | Freq: Two times a day (BID) | CUTANEOUS | Status: DC
  Administered 2011-09-22 – 2011-09-25 (×7): 1 via NASAL
  Filled 2011-09-22: qty 22

## 2011-09-22 MED ORDER — DIAZEPAM 5 MG PO TABS: 5.0000 mg | ORAL_TABLET | Freq: Three times a day (TID) | ORAL | Status: DC | PRN

## 2011-09-22 MED ORDER — SODIUM CHLORIDE 0.9 % IV BOLUS (SEPSIS)
2000.0000 mL | Freq: Once | INTRAVENOUS | Status: AC
  Administered 2011-09-22: 2000 mL via INTRAVENOUS

## 2011-09-22 MED ORDER — TACROLIMUS 1 MG PO CAPS
1.0000 mg | ORAL_CAPSULE | Freq: Two times a day (BID) | ORAL | Status: DC
  Administered 2011-09-22 – 2011-09-26 (×8): 1 mg via ORAL
  Filled 2011-09-22 (×9): qty 1

## 2011-09-22 MED ORDER — BIOTENE DRY MOUTH MT LIQD
15.0000 mL | Freq: Two times a day (BID) | OROMUCOSAL | Status: DC
  Administered 2011-09-23 – 2011-09-26 (×7): 15 mL via OROMUCOSAL

## 2011-09-22 MED ORDER — SODIUM CHLORIDE 0.9 % IV SOLN
INTRAVENOUS | Status: DC
  Filled 2011-09-22 (×2): qty 1

## 2011-09-22 MED ORDER — DEXTROSE 50 % IV SOLN: 25.0000 mL | INTRAVENOUS | Status: DC | PRN

## 2011-09-22 MED ORDER — FAMOTIDINE 20 MG PO TABS
20.0000 mg | ORAL_TABLET | Freq: Two times a day (BID) | ORAL | Status: DC
  Administered 2011-09-22 – 2011-09-26 (×8): 20 mg via ORAL
  Filled 2011-09-22 (×9): qty 1

## 2011-09-22 MED ORDER — SODIUM CHLORIDE 0.9 % IV SOLN
INTRAVENOUS | Status: DC
  Administered 2011-09-24 – 2011-09-25 (×3): via INTRAVENOUS

## 2011-09-22 MED ORDER — SODIUM POLYSTYRENE SULFONATE 15 GM/60ML PO SUSP
30.0000 g | Freq: Once | ORAL | Status: AC
  Administered 2011-09-22: 30 g via ORAL
  Filled 2011-09-22: qty 120

## 2011-09-22 MED ORDER — INSULIN REGULAR BOLUS VIA INFUSION
0.0000 [IU] | Freq: Three times a day (TID) | INTRAVENOUS | Status: DC
  Administered 2011-09-22: 10.8 [IU] via INTRAVENOUS
  Filled 2011-09-22 (×4): qty 10

## 2011-09-22 MED ORDER — DEXTROSE-NACL 5-0.45 % IV SOLN: INTRAVENOUS | Status: DC

## 2011-09-22 MED ORDER — PREDNISONE 5 MG PO TABS
5.0000 mg | ORAL_TABLET | Freq: Every day | ORAL | Status: DC
  Administered 2011-09-23 – 2011-09-26 (×4): 5 mg via ORAL
  Filled 2011-09-22 (×4): qty 1

## 2011-09-22 MED ORDER — MYCOPHENOLATE MOFETIL 250 MG PO CAPS
500.0000 mg | ORAL_CAPSULE | Freq: Two times a day (BID) | ORAL | Status: DC
  Administered 2011-09-22 – 2011-09-26 (×8): 500 mg via ORAL
  Filled 2011-09-22 (×9): qty 2

## 2011-09-22 MED ORDER — LEVOTHYROXINE SODIUM 137 MCG PO TABS
137.0000 ug | ORAL_TABLET | Freq: Every day | ORAL | Status: DC
  Administered 2011-09-23 – 2011-09-26 (×4): 137 ug via ORAL
  Filled 2011-09-22 (×5): qty 1

## 2011-09-22 MED ORDER — DEXTROSE-NACL 5-0.45 % IV SOLN
INTRAVENOUS | Status: DC
  Administered 2011-09-23 (×2): via INTRAVENOUS

## 2011-09-22 MED ORDER — POTASSIUM CHLORIDE 10 MEQ/100ML IV SOLN
10.0000 meq | INTRAVENOUS | Status: AC
  Administered 2011-09-22 (×2): 10 meq via INTRAVENOUS
  Filled 2011-09-22 (×2): qty 100

## 2011-09-22 MED ORDER — SODIUM CHLORIDE 0.9 % IV SOLN
INTRAVENOUS | Status: DC
  Administered 2011-09-22: 18:00:00 via INTRAVENOUS

## 2011-09-22 MED ORDER — CHLORHEXIDINE GLUCONATE CLOTH 2 % EX PADS
6.0000 | MEDICATED_PAD | Freq: Every day | CUTANEOUS | Status: DC
  Administered 2011-09-23 – 2011-09-24 (×2): 6 via TOPICAL

## 2011-09-22 MED ORDER — SODIUM CHLORIDE 0.9 % IV SOLN
INTRAVENOUS | Status: DC
  Administered 2011-09-22: 4.4 [IU]/h via INTRAVENOUS
  Filled 2011-09-22: qty 1

## 2011-09-22 MED ORDER — FENTANYL CITRATE 0.05 MG/ML IJ SOLN
50.0000 ug | Freq: Once | INTRAMUSCULAR | Status: AC
  Administered 2011-09-22: 50 ug via INTRAVENOUS
  Filled 2011-09-22: qty 2

## 2011-09-22 MED ORDER — OXYCODONE-ACETAMINOPHEN 5-325 MG PO TABS
1.0000 | ORAL_TABLET | Freq: Four times a day (QID) | ORAL | Status: DC | PRN
  Administered 2011-09-22 – 2011-09-24 (×5): 1 via ORAL
  Administered 2011-09-24 – 2011-09-26 (×7): 2 via ORAL
  Filled 2011-09-22 (×2): qty 2
  Filled 2011-09-22: qty 1
  Filled 2011-09-22: qty 2
  Filled 2011-09-22 (×2): qty 1
  Filled 2011-09-22 (×3): qty 2
  Filled 2011-09-22: qty 1
  Filled 2011-09-22 (×2): qty 2

## 2011-09-22 NOTE — ED Provider Notes (Signed)
History     CSN: [id number]  Arrival date & time 09/22/11  1406   First MD Initiated Contact with Patient 09/22/11 1420      Chief Complaint  Patient presents with  . Hyperglycemia    (Consider location/radiation/quality/duration/timing/severity/associated sxs/prior treatment) HPI Comments: Patient with history of insulin-dependent diabetes, history of DKA, history of renal transplant -- presents with high blood sugar for the past 2 days. Patient has been out of her Lantus for the past several days and she has not been taking any insulin. She denies fever, chills, vomiting. She reports polyuria and polydipsia. She complains of her mouth being very dry. She denies abdominal pain or confusion. Patient has an area of skin breakdown on her right shin that has not been draining. She has not been monitoring her blood sugar at home. EMS reported a blood sugar in the 500s. No treatments prior to arrival. Nothing makes the problem better or worse.  Patient is a 46 y.o. female presenting with diabetes problem. The history is provided by the patient.  Diabetes Hypoglycemia symptoms include headaches. Pertinent negatives for hypoglycemia include no confusion. Associated symptoms include fatigue. Pertinent negatives for diabetes include no chest pain.    Past Medical History  Diagnosis Date  . Diabetes mellitus   . Hypertension   . Kidney disease   . Hypothyroid   . Depression   . End stage renal disease     right arm AV graft, post transplant  . 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 Back surgery 2001  . Tubal ligation   . Dg av dialysis graft declot or   . Nephrectomy transplanted organ     Family History  Problem Relation Age of Onset  . Emphysema Mother   . Emphysema Father   . Throat cancer Mother     History  Substance Use Topics  . Smoking status: Former Smoker    Types: Cigarettes  . Smokeless tobacco: Never  Used  . Alcohol Use: No    OB History    Grav Para Term Preterm Abortions TAB SAB Ect Mult Living                  Review of Systems  Constitutional: Positive for fatigue. Negative for fever and chills.  HENT: Negative for sore throat and rhinorrhea.   Eyes: Negative for discharge.  Respiratory: Negative for shortness of breath.   Cardiovascular: Negative for chest pain.  Gastrointestinal: Negative for nausea, vomiting, abdominal pain, diarrhea and constipation.  Genitourinary: Positive for frequency. Negative for dysuria, hematuria, flank pain and difficulty urinating.  Musculoskeletal: Negative for myalgias.  Skin: Negative for rash.  Neurological: Positive for headaches.  Psychiatric/Behavioral: Negative for confusion.    Allergies  Ace inhibitors and Adhesive  Home Medications   Current Outpatient Rx  Name Route Sig Dispense Refill  . AMLODIPINE BESYLATE 5 MG PO TABS Oral Take 5 mg by mouth daily.      [name] CARVEDILOL 25 MG PO TABS Oral Take 50 mg by mouth 2 (two) times daily.     [name] DIAZEPAM 5 MG PO TABS Oral Take 1 tablet (5 mg total) by mouth every 8 (eight) hours as needed (for muscle spasm). 12 tablet 0  . INSULIN ASPART 100 UNIT/ML [place] SOLN Subcutaneous Inject 8 Units into the skin 3 (three) times daily before meals.     . INSULIN GLARGINE 100 UNIT/ML [place] SOLN Subcutaneous Inject  40 Units into the skin 2 (two) times daily.     [name] LEVOTHYROXINE SODIUM 137 MCG PO TABS Oral Take 137 mcg by mouth daily. Name brand    . MYCOPHENOLATE MOFETIL 250 MG PO CAPS Oral Take 500 mg by mouth 2 (two) times daily.     . OXYCODONE-ACETAMINOPHEN 5-325 MG PO TABS Oral Take 1-2 tablets by mouth every 6 (six) hours as needed for pain. 15 tablet 0  . PREDNISONE 5 MG PO TABS Oral Take 5 mg by mouth daily.      . TYLENOL COLD/FLU SEVERE DAY PO Oral Take by mouth.      [name] RANITIDINE HCL 150 MG PO CAPS Oral Take 150 mg by mouth daily.     [name] TACROLIMUS 1 MG PO CAPS Oral Take 1 mg by mouth 2 (two)  times daily.       There were no vitals taken for this visit.  Physical Exam  Nursing note and vitals reviewed. Constitutional: She is oriented to person, place, and time. She appears well-developed and well-nourished.  HENT:  Head: Normocephalic and atraumatic.  Nose: Nose normal.  Mouth/Throat: Uvula is midline and oropharynx is clear and moist. Mucous membranes are not pale and dry.       Dry mucous membranes.  Eyes: Pupils are equal, round, and reactive to light. Right eye exhibits no discharge. Left eye exhibits no discharge.  Neck: Normal range of motion. Neck supple.  Cardiovascular: Normal rate and regular rhythm.  Exam reveals no gallop and no friction rub.   No murmur heard. Pulmonary/Chest: Effort normal and breath sounds normal. No respiratory distress.  Abdominal: Soft. Bowel sounds are normal. There is no tenderness. There is no rebound and no guarding.  Musculoskeletal: Normal range of motion.  Neurological: She is alert and oriented to person, place, and time. She has normal strength. No cranial nerve deficit or sensory deficit. Coordination normal. GCS eye subscore is 4. GCS verbal subscore is 5. GCS motor subscore is 6.  Skin: Skin is warm and dry. No rash noted.  Psychiatric: She has a normal mood and affect.    ED Course  Procedures (including critical care time)  Labs Reviewed  GLUCOSE, CAPILLARY - Abnormal; Notable for the following:    Glucose-Capillary >600 (*)    All other components within normal limits  DIFFERENTIAL - Abnormal; Notable for the following:    Neutrophils Relative 86 (*)    Neutro Abs 7.8 (*)    Lymphocytes Relative 6 (*)    Lymphs Abs 0.6 (*)    All other components within normal limits  BASIC METABOLIC PANEL - Abnormal; Notable for the following:    Sodium 127 (*)    Potassium 6.3 (*)    Chloride 88 (*)    CO2 14 (*)    Glucose, Bld 670 (*)    BUN 37 (*)    Creatinine, Ser 1.35 (*)    Calcium 11.3 (*)    GFR calc non Af Amer 47  (*)    GFR calc Af Amer 54 (*)    All other components within normal limits  URINALYSIS, ROUTINE W REFLEX MICROSCOPIC - Abnormal; Notable for the following:    Glucose, UA >1000 (*)    Hgb urine dipstick TRACE (*)    Ketones, ur >80 (*)    All other components within normal limits  BLOOD GAS, VENOUS - Abnormal; Notable for the following:    pH, Ven 7.214 (*)    pCO2, Ven 43.5 (*)  pO2, Ven 28.7 (*)    Bicarbonate 16.9 (*)    Acid-base deficit 10.4 (*)    All other components within normal limits  GLUCOSE, CAPILLARY - Abnormal; Notable for the following:    Glucose-Capillary 500 (*)    All other components within normal limits  BASIC METABOLIC PANEL - Abnormal; Notable for the following:    Sodium 130 (*)    Potassium 5.8 (*)    CO2 14 (*)    Glucose, Bld 586 (*)    BUN 36 (*)    Creatinine, Ser 1.30 (*)    GFR calc non Af Amer 49 (*)    GFR calc Af Amer 57 (*)    All other components within normal limits  GLUCOSE, CAPILLARY - Abnormal; Notable for the following:    Glucose-Capillary >600 (*)    All other components within normal limits  GLUCOSE, CAPILLARY - Abnormal; Notable for the following:    Glucose-Capillary 527 (*)    All other components within normal limits  CBC  URINE MICROSCOPIC-ADD ON  POCT CBG MONITORING  BASIC METABOLIC PANEL  BASIC METABOLIC PANEL  BASIC METABOLIC PANEL  CBC  MRSA PCR SCREENING   No results found.   1. DKA (diabetic ketoacidoses)     2:30 PM patient seen and examined. Blood sugar greater than 600. Blood pressure approximately 90/60 with normal rate. Patient appears well and is mentating normally. IV fluids started. Workup is pending. No obvious source of infection.   3:48 PM blood gas was noted. The patient was started on glucose stabilizer protocol.  Patient was admitted for DKA. Potassium was noted and resent. Spoke with triad, patient was admitted to a step down bed. Blood pressure improved but still continued to be low during  stay in emergency department. Fluids continued.   MDM  Admit for diabetic ketoacidosis.        Eustace Moore [place], [place] 09/22/11 1942

## 2011-09-22 NOTE — Progress Notes (Signed)
ED CM noted Admission RN CM consult for trouble getting medications. Pt states she can get medications but new issue involves increased cost of her insulin since the beginning of 2013.  Insulin costing $40 when was previously $10-15 cheaper.  Cm referred pt to pcp for possible medication reconciliation intervention & [facility] medicare plan contact number on back of insurance ID card to contact to see if lower comparable cost  Medication.  Discussed lower cost pharmacies. Pt states she has access to internet and able to go online to review [employer] site.  Pt appreciative of resource information provided.

## 2011-09-22 NOTE — ED Provider Notes (Signed)
46 year old female who is diabetic and status post renal transplant ran out of her insulin yesterday and so she did not get her insulin yesterday or today. She's had urinary frequency. She denies abdominal pain or vomiting. She's also being treated for venous stasis ulcer on her right lower leg and is on Bactrim for that. Laboratory evaluation confirms diabetic ketoacidosis. She is started on an insulin drip and will be admitted. Her venous stasis ulcer on the right leg does not appear acutely infected at this time, and healing venous stasis ulcers present on the left lower leg.  CRITICAL CARE Performed by: Dione Booze   Total critical care time: 35 minutes  Critical care time was exclusive of separately billable procedures and treating other patients.  Critical care was necessary to treat or prevent imminent or life-threatening deterioration.  Critical care was time spent personally by me on the following activities: development of treatment plan with patient and/or surrogate as well as nursing, discussions with consultants, evaluation of patient's response to treatment, examination of patient, obtaining history from patient or surrogate, ordering and performing treatments and interventions, ordering and review of laboratory studies, ordering and review of radiographic studies, pulse oximetry and re-evaluation of patient's condition.   Dione Booze, MD 09/22/11 [phone]

## 2011-09-22 NOTE — H&P (Signed)
Patient's PCP: [name], MD, MD  Chief Complaint: High blood sugars  History of Present Illness: [name] is a 46 y.o. Caucasian female with history of diabetes on insulin, end stage renal disease status post renal transplant on chronic immunosuppressive therapy, hypertension, hypothyroidism, and depression who presents with the above complaints.  Reports that about about 3 days ago she ran out of her Lantus and had been taking only NovoLog.  She has had increased urination and increased thirst as a result presented to the emergency department for further evaluation.  In the medicine department patient was found to be in DKA with blood sugars greater than 670.  Patient was also found to be hyponatremic and hyperkalemic.  The hospitalist service was asked to admit the patient for further care and management.  Denies any recent fevers at home, denies any chills, denies any chest pain, shortness of breath, abdominal pain, diarrhea, headaches or vision changes.  Reports that on the right shin there was a blister which popped open.  Meds: Scheduled Meds:   . enoxaparin  40 mg Subcutaneous Q24H  . famotidine  20 mg Oral BID  . fentaNYL  50 mcg Intravenous Once  . levothyroxine  137 mcg Oral Daily  . mycophenolate  500 mg Oral BID  . potassium chloride  10 mEq Intravenous Q1H  . predniSONE  5 mg Oral Daily  . sodium chloride  2,000 mL Intravenous Once  . sodium polystyrene  30 g Oral Once  . tacrolimus  1 mg Oral BID  . DISCONTD: insulin regular  0-10 Units Intravenous TID WC   Continuous Infusions:   . sodium chloride    . sodium chloride    . dextrose 5 % and 0.45% NaCl    . insulin (NOVOLIN-R) infusion    . DISCONTD: sodium chloride 100 mL/hr at 09/22/11 1746  . DISCONTD: dextrose 5 % and 0.45% NaCl    . DISCONTD: insulin (NOVOLIN-R) infusion 4.4 Units/hr (09/22/11 1641)   PRN Meds:.dextrose, diazepam, oxyCODONE-acetaminophen, DISCONTD: dextrose Allergies: Ace  inhibitors and Adhesive Past Medical History  Diagnosis Date  . Diabetes mellitus   . Hypertension   . Kidney disease   . Hypothyroid   . Depression   . End stage renal disease     right arm AV graft, post transplant  . 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 Back surgery 2001  . Tubal ligation   . Dg av dialysis graft declot or   . Nephrectomy transplanted organ    Family History  Problem Relation Age of Onset  . Emphysema Mother   . Throat cancer Mother   . COPD Mother   . Emphysema Father   . COPD Father   . Stroke Father    History   Social History  . Marital Status: Married    Spouse Name: N/A    Number of Children: N/A  . Years of Education: N/A   Occupational History  . Not on file.   Social History Main Topics  . Smoking status: Former Smoker -- 11 years    Types: Cigarettes    Quit date: 09/21/1998  . Smokeless tobacco: Never Used  . Alcohol Use: No  . Drug Use: No  . Sexually Active: Not on file   Other Topics Concern  . Not on file   Social History Narrative  . No narrative on file   Review of Systems: All systems reviewed  with the patient and positive as per history of present illness, otherwise all other systems are negative.  Physical Exam: Blood pressure 109/52, pulse 84, temperature 97.9 F (36.6 C), temperature source Oral, resp. rate 18, last menstrual period 09/15/2011, SpO2 100.00%. General: Awake, Oriented x3, No acute distress. HEENT: EOMI, Moist mucous membranes Neck: Supple CV: S1 and S2 Lungs: Clear to ascultation bilaterally Abdomen: Soft, Nontender, Nondistended, +bowel sounds, surgical scar midline noted. Ext: Good pulses. Trace edema. No clubbing or cyanosis noted.  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  Right lower extremity, shin area of ulceration noted with no erythema or pus.  Left lower extremity shin again ulcerated but  healing well. Neuro: Cranial Nerves II-XII grossly intact. Has 5/5 motor strength in upper and lower extremities.  Lab results:  Basename 09/22/11 1741 09/22/11 1427  NA 130* 127*  K 5.8* 6.3*  CL 96 88*  CO2 14* 14*  GLUCOSE PENDING 670*  BUN 36* 37*  CREATININE 1.30* 1.35*  CALCIUM 9.8 11.3*  MG -- --  PHOS -- --   No results found for this basename: AST:2,ALT:2,ALKPHOS:2,BILITOT:2,PROT:2,ALBUMIN:2 in the last 72 hours No results found for this basename: LIPASE:2,AMYLASE:2 in the last 72 hours  Basename 09/22/11 1427  WBC 9.2  NEUTROABS 7.8*  HGB 13.2  HCT 41.6  MCV 88.3  PLT 230   No results found for this basename: CKTOTAL:3,CKMB:3,CKMBINDEX:3,TROPONINI:3 in the last 72 hours No components found with this basename: POCBNP:3 No results found for this basename: DDIMER in the last 72 hours No results found for this basename: HGBA1C:2 in the last 72 hours No results found for this basename: CHOL:2,HDL:2,LDLCALC:2,TRIG:2,CHOLHDL:2,LDLDIRECT:2 in the last 72 hours No results found for this basename: TSH,T4TOTAL,FREET3,T3FREE,THYROIDAB in the last 72 hours No results found for this basename: VITAMINB12:2,FOLATE:2,FERRITIN:2,TIBC:2,IRON:2,RETICCTPCT:2 in the last 72 hours Imaging results:  Dg Lumbar Spine Complete  09/13/2011  *RADIOLOGY REPORT*  Clinical Data: Low back pain, prior back surgery  LUMBAR SPINE - COMPLETE 4+ VIEW  Comparison: 03/08/2007  Findings: Five lumbar-type vertebral bodies.  No evidence of fracture or dislocation.  Vertebral body heights are maintained.  Mild multilevel degenerative changes, most prominent at L4-5.  Vascular calcifications.  IMPRESSION: No fracture or dislocation is seen.  Original Report Authenticated By: Charline Bills, M.D.   Other results: EKG: Slightly peaked T waves in precordial leads.  Otherwise unchanged from previous EKGs.  Assessment & Plan by Problem: 1. DKA, type 1.  Likely due to the patient running out of her Lantus.  Most  recent anion gap was 20.  Continue insulin drip.  Will have the patient n.p.o. until anion gap closes.  Patient will need case management assistance with obtaining medications for the patient.  2.  Hyperkalemia.  Likely due to DKA.  Give the patient Kayexalate 30 gm once.  Continue cardiac monitoring.  3.  Hyponatremia.  Again likely due to DKA from dehydration.  4.  Dehydration/hypotension.  Likely due to DKA.  Continue aggressive fluid resuscitation.  5.  Hypertension.  Blood pressure borderline low at this time.  Holding home antihypertensive medications at this time.  If blood pressure improves could start the patient back on home medications.  6.  DM (diabetes mellitus), type 1, uncontrolled with complications.  Management as indicated above.  7.  End stage renal disease/status post renal transplant on chronic immunosuppressive therapy.  Continue home immunosuppressive regimen.  8.  Hypothyroidism.  Continue levothyroxine.  9.  History of depression.  Continue home medications.  10.  Ulcer over the right shin.  Does not appear to be infected at this time.  Will not start any antibiotics at this time.  Continue to follow closely.  11.  Prophylaxis.  Lovenox.  12.  CODE STATUS.  Full code.  This was discussed with the patient at the time of admission.  Time spent on admission, talking to the patient, and coordinating care was: 50 mins.  Liya Strollo A, MD 09/22/2011, 7:08 PM

## 2011-09-22 NOTE — ED Notes (Signed)
Will start insulin drip once 2L NS have infused.  Family remains at the bedside.  Pt is alert and oriented

## 2011-09-22 NOTE — ED Notes (Signed)
JYN:[id number]<AO> Expected date:09/22/11<BR> Expected time: 1:47 PM<BR> Means of arrival:Ambulance<BR> Comments:<BR> EMS 63 GC, 45 yof weakness and HA

## 2011-09-22 NOTE — ED Notes (Signed)
Pt to ER with hyperglycemia. Pt states that she has not checked her blood sugar in over a week and ran out of hier insulin this am. Pt with states feels like her blood sugar is elevated. Pt with c/o generalized weakness and h/a

## 2011-09-23 LAB — BASIC METABOLIC PANEL
CO2: 20 mEq/L (ref 19–32)
Calcium: 9.6 mg/dL (ref 8.4–10.5)
Chloride: 103 mEq/L (ref 96–112)
Chloride: 101 mEq/L (ref 96–112)
Creatinine, Ser: 1.33 mg/dL — ABNORMAL HIGH (ref 0.50–1.10)
GFR calc Af Amer: 57 mL/min — ABNORMAL LOW (ref >90)
GFR calc Af Amer: 55 mL/min — ABNORMAL LOW (ref >90)
GFR calc non Af Amer: 49 mL/min — ABNORMAL LOW (ref >90)
GFR calc non Af Amer: 48 mL/min — ABNORMAL LOW (ref >90)
Glucose, Bld: 124 mg/dL — ABNORMAL HIGH (ref 70–99)
Potassium: 4 mEq/L (ref 3.5–5.1)
Potassium: 4.2 mEq/L (ref 3.5–5.1)
Potassium: 4.6 mEq/L (ref 3.5–5.1)
Sodium: 133 mEq/L — ABNORMAL LOW (ref 135–145)
Sodium: 134 mEq/L — ABNORMAL LOW (ref 135–145)

## 2011-09-23 LAB — CBC
HCT: 39.1 % (ref 36.0–46.0)
MCHC: 32.2 g/dL (ref 30.0–36.0)
RDW: 13.9 % (ref 11.5–15.5)
WBC: 7.1 10*3/uL (ref 4.0–10.5)

## 2011-09-23 LAB — GLUCOSE, CAPILLARY
Glucose-Capillary: 134 mg/dL — ABNORMAL HIGH (ref 70–99)
Glucose-Capillary: 125 mg/dL — ABNORMAL HIGH (ref 70–99)
Glucose-Capillary: 134 mg/dL — ABNORMAL HIGH (ref 70–99)
Glucose-Capillary: 179 mg/dL — ABNORMAL HIGH (ref 70–99)
Glucose-Capillary: 196 mg/dL — ABNORMAL HIGH (ref 70–99)

## 2011-09-23 MED ORDER — INSULIN ASPART 100 UNIT/ML ~~LOC~~ SOLN
0.0000 [IU] | Freq: Three times a day (TID) | SUBCUTANEOUS | Status: DC
  Administered 2011-09-23: 20 [IU] via SUBCUTANEOUS
  Administered 2011-09-23: 10 [IU] via SUBCUTANEOUS
  Administered 2011-09-23: 11 [IU] via SUBCUTANEOUS
  Administered 2011-09-23: 4 [IU] via SUBCUTANEOUS
  Administered 2011-09-24: 15 [IU] via SUBCUTANEOUS
  Administered 2011-09-24: 20 [IU] via SUBCUTANEOUS
  Administered 2011-09-24: 3 [IU] via SUBCUTANEOUS
  Administered 2011-09-25 – 2011-09-26 (×4): 4 [IU] via SUBCUTANEOUS
  Filled 2011-09-23: qty 3

## 2011-09-23 MED ORDER — DEXTROSE-NACL 5-0.45 % IV SOLN: INTRAVENOUS | Status: DC

## 2011-09-23 MED ORDER — POTASSIUM CHLORIDE 10 MEQ/100ML IV SOLN
10.0000 meq | INTRAVENOUS | Status: AC
  Administered 2011-09-23: 10 meq via INTRAVENOUS
  Filled 2011-09-23 (×2): qty 100

## 2011-09-23 MED ORDER — INSULIN GLARGINE 100 UNIT/ML ~~LOC~~ SOLN
40.0000 [IU] | Freq: Every day | SUBCUTANEOUS | Status: DC
  Administered 2011-09-23: 40 [IU] via SUBCUTANEOUS
  Administered 2011-09-23: 10 [IU] via SUBCUTANEOUS

## 2011-09-23 MED ORDER — INSULIN GLARGINE 100 UNIT/ML ~~LOC~~ SOLN
40.0000 [IU] | Freq: Two times a day (BID) | SUBCUTANEOUS | Status: DC
  Filled 2011-09-23: qty 3

## 2011-09-23 MED ORDER — INSULIN GLARGINE 100 UNIT/ML ~~LOC~~ SOLN: 40.0000 [IU] | Freq: Every day | SUBCUTANEOUS | Status: DC

## 2011-09-23 MED ORDER — INSULIN ASPART 100 UNIT/ML ~~LOC~~ SOLN
0.0000 [IU] | Freq: Every day | SUBCUTANEOUS | Status: DC
  Administered 2011-09-23: 4 [IU] via SUBCUTANEOUS
  Administered 2011-09-24: 3 [IU] via SUBCUTANEOUS

## 2011-09-23 MED ORDER — ONDANSETRON HCL 4 MG/2ML IJ SOLN
4.0000 mg | Freq: Four times a day (QID) | INTRAMUSCULAR | Status: DC | PRN
  Administered 2011-09-23: 4 mg via INTRAVENOUS
  Filled 2011-09-23: qty 2

## 2011-09-23 MED ORDER — SODIUM CHLORIDE 0.9 % IV SOLN: INTRAVENOUS | Status: DC

## 2011-09-23 NOTE — Progress Notes (Signed)
Inpatient Diabetes Program Recommendations  AACE/ADA: New Consensus Statement on Inpatient Glycemic Control (2009)  Target Ranges:  Prepandial:   less than 140 mg/dL      Peak postprandial:   less than 180 mg/dL (1-2 hours)      Critically ill patients:  140 - 180 mg/dL   Reason for Visit:   Results for [name], [name] (MRN [id number]) as of 09/23/2011 14:54  Ref. Range 09/23/2011 05:50 09/23/2011 06:52 09/23/2011 08:06 09/23/2011 09:05 09/23/2011 11:58  Glucose-Capillary Latest Range: 70-99 mg/dL 130 (H) 865 (H) 784 (H)  287 (H)  Glucose Latest Range: 70-99 mg/dL    696 (H)     Inpatient Diabetes Program Recommendations Correction (SSI): Decrease Novolog correction to moderate tidwc and hs Insulin - Meal Coverage: Add Novolog 4 units tidwc for meal coverage insulin HgbA1C: Check HgbA1C to assess glycemic control

## 2011-09-23 NOTE — Progress Notes (Signed)
Pt CBG at 1700 was 518. MD Dr Susie Cassette called. She said to give Pt 20units of Novolog insulin and 10 units of lantus and recheck in one hr. CBG rechecked and it came down to 439. She ordered another 10 units of Novolog. To be rechecked in another one hour. Patient is asymtomatic  and is transferred as ordered to 1311 by Nurse Tech via wheel chair. Patient's belongings, medications and chart sent along.

## 2011-09-23 NOTE — Progress Notes (Signed)
Subjective: Feeling well today except for mild nausea this morning  Objective: Vital signs in last 24 hours: Filed Vitals:   09/23/11 0800 09/23/11 1000 09/23/11 1102 09/23/11 1200  BP: 121/53 129/65  134/62  Pulse: 78 88    Temp: 97.8 F (36.6 C)     TempSrc: Oral     Resp: 16 18    Height:      Weight:      SpO2: 96% 93% 96%     Intake/Output Summary (Last 24 hours) at 09/23/11 1302 Last data filed at 09/23/11 1200  Gross per 24 hour  Intake   2398 ml  Output      0 ml  Net   2398 ml    Weight change:    HEENT: EOMI, Moist mucous membranes  Neck: Supple  CV: S1 and S2  Lungs: Clear to ascultation bilaterally  Abdomen: Soft, Nontender, Nondistended, +bowel sounds, surgical scar midline noted.  Ext: Good pulses. Trace edema. No clubbing or cyanosis noted. 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 Right lower extremity, shin area of ulceration noted with no erythema or pus. Left lower extremity shin again ulcerated but healing well.  Neuro: Cranial Nerves II-XII grossly intact. Has 5/5 motor strength in upper and lower extremities.  Lab Results: Results for orders placed during the hospital encounter of 09/22/11 (from the past 24 hour(s))  GLUCOSE, CAPILLARY     Status: Abnormal   Collection Time   09/22/11  2:14 PM      Component Value Range   Glucose-Capillary >600 (*) 70 - 99 (mg/dL)   Comment 1 Documented in Chart     Comment 2 Notify RN    CBC     Status: Normal   Collection Time   09/22/11  2:27 PM      Component Value Range   WBC 9.2  4.0 - 10.5 (K/uL)   RBC 4.71  3.87 - 5.11 (MIL/uL)   Hemoglobin 13.2  12.0 - 15.0 (g/dL)   HCT 16.1  09.6 - 04.5 (%)   MCV 88.3  78.0 - 100.0 (fL)   MCH 28.0  26.0 - 34.0 (pg)   MCHC 31.7  30.0 - 36.0 (g/dL)   RDW 40.9  81.1 - 91.4 (%)   Platelets 230  150 - 400 (K/uL)  DIFFERENTIAL     Status: Abnormal   Collection Time   09/22/11  2:27 PM      Component Value Range   Neutrophils Relative  86 (*) 43 - 77 (%)   Neutro Abs 7.8 (*) 1.7 - 7.7 (K/uL)   Lymphocytes Relative 6 (*) 12 - 46 (%)   Lymphs Abs 0.6 (*) 0.7 - 4.0 (K/uL)   Monocytes Relative 7  3 - 12 (%)   Monocytes Absolute 0.7  0.1 - 1.0 (K/uL)   Eosinophils Relative 1  0 - 5 (%)   Eosinophils Absolute 0.1  0.0 - 0.7 (K/uL)   Basophils Relative 0  0 - 1 (%)   Basophils Absolute 0.0  0.0 - 0.1 (K/uL)  BASIC METABOLIC PANEL     Status: Abnormal   Collection Time   09/22/11  2:27 PM      Component Value Range   Sodium 127 (*) 135 - 145 (mEq/L)   Potassium 6.3 (*) 3.5 - 5.1 (mEq/L)   Chloride 88 (*) 96 - 112 (mEq/L)   CO2 14 (*) 19 - 32 (mEq/L)   Glucose, Bld 670 (*) 70 -  99 (mg/dL)   BUN 37 (*) 6 - 23 (mg/dL)   Creatinine, Ser 1.61 (*) 0.50 - 1.10 (mg/dL)   Calcium 09.6 (*) 8.4 - 10.5 (mg/dL)   GFR calc non Af Amer 47 (*) >90 (mL/min)   GFR calc Af Amer 54 (*) >90 (mL/min)  URINALYSIS, ROUTINE W REFLEX MICROSCOPIC     Status: Abnormal   Collection Time   09/22/11  2:35 PM      Component Value Range   Color, Urine YELLOW  YELLOW    APPearance CLEAR  CLEAR    Specific Gravity, Urine 1.028  1.005 - 1.030    pH 5.0  5.0 - 8.0    Glucose, UA >1000 (*) NEGATIVE (mg/dL)   Hgb urine dipstick TRACE (*) NEGATIVE    Bilirubin Urine NEGATIVE  NEGATIVE    Ketones, ur >80 (*) NEGATIVE (mg/dL)   Protein, ur NEGATIVE  NEGATIVE (mg/dL)   Urobilinogen, UA 0.2  0.0 - 1.0 (mg/dL)   Nitrite NEGATIVE  NEGATIVE    Leukocytes, UA NEGATIVE  NEGATIVE   URINE MICROSCOPIC-ADD ON     Status: Normal   Collection Time   09/22/11  2:35 PM      Component Value Range   Squamous Epithelial / LPF RARE  RARE    RBC / HPF 0-2  <3 (RBC/hpf)  BLOOD GAS, VENOUS     Status: Abnormal   Collection Time   09/22/11  2:45 PM      Component Value Range   O2 Content 4.0     Delivery systems NASAL CANNULA     pH, Ven 7.214 (*) 7.250 - 7.300    pCO2, Ven 43.5 (*) 45.0 - 50.0 (mmHg)   pO2, Ven 28.7 (*) 30.0 - 45.0 (mmHg)   Bicarbonate 16.9 (*) 20.0 -  24.0 (mEq/L)   TCO2 15.8  0 - 100 (mmol/L)   Acid-base deficit 10.4 (*) 0.0 - 2.0 (mmol/L)   O2 Saturation 44.8     Patient temperature 98.6     Collection site VEIN     Drawn by [phone]     Sample type VEIN    GLUCOSE, CAPILLARY     Status: Abnormal   Collection Time   09/22/11  4:36 PM      Component Value Range   Glucose-Capillary 500 (*) 70 - 99 (mg/dL)   Comment 1 Documented in Chart     Comment 2 Notify RN    GLUCOSE, CAPILLARY     Status: Abnormal   Collection Time   09/22/11  5:39 PM      Component Value Range   Glucose-Capillary >600 (*) 70 - 99 (mg/dL)   Comment 1 Notify RN     Comment 2 Documented in Chart    BASIC METABOLIC PANEL     Status: Abnormal   Collection Time   09/22/11  5:41 PM      Component Value Range   Sodium 130 (*) 135 - 145 (mEq/L)   Potassium 5.8 (*) 3.5 - 5.1 (mEq/L)   Chloride 96  96 - 112 (mEq/L)   CO2 14 (*) 19 - 32 (mEq/L)   Glucose, Bld 586 (*) 70 - 99 (mg/dL)   BUN 36 (*) 6 - 23 (mg/dL)   Creatinine, Ser 8.11 (*) 0.50 - 1.10 (mg/dL)   Calcium 9.8  8.4 - 91.4 (mg/dL)   GFR calc non Af Amer 49 (*) >90 (mL/min)   GFR calc Af Amer 57 (*) >90 (mL/min)  GLUCOSE, CAPILLARY  Status: Abnormal   Collection Time   09/22/11  6:48 PM      Component Value Range   Glucose-Capillary 527 (*) 70 - 99 (mg/dL)   Comment 1 Documented in Chart     Comment 2 Notify RN    MRSA PCR SCREENING     Status: Abnormal   Collection Time   09/22/11  7:11 PM      Component Value Range   MRSA by PCR POSITIVE (*) NEGATIVE   GLUCOSE, CAPILLARY     Status: Abnormal   Collection Time   09/22/11  7:51 PM      Component Value Range   Glucose-Capillary 392 (*) 70 - 99 (mg/dL)   Comment 1 Documented in Chart     Comment 2 Notify RN    GLUCOSE, CAPILLARY     Status: Abnormal   Collection Time   09/22/11  8:48 PM      Component Value Range   Glucose-Capillary 310 (*) 70 - 99 (mg/dL)   Comment 1 Documented in Chart     Comment 2 Notify RN    GLUCOSE, CAPILLARY      Status: Abnormal   Collection Time   09/22/11 10:03 PM      Component Value Range   Glucose-Capillary 260 (*) 70 - 99 (mg/dL)  CBC     Status: Abnormal   Collection Time   09/22/11 10:30 PM      Component Value Range   WBC 7.7  4.0 - 10.5 (K/uL)   RBC 4.27  3.87 - 5.11 (MIL/uL)   Hemoglobin 11.9 (*) 12.0 - 15.0 (g/dL)   HCT 40.9  81.1 - 91.4 (%)   MCV 84.5  78.0 - 100.0 (fL)   MCH 27.9  26.0 - 34.0 (pg)   MCHC 33.0  30.0 - 36.0 (g/dL)   RDW 78.2  95.6 - 21.3 (%)   Platelets 223  150 - 400 (K/uL)  BASIC METABOLIC PANEL     Status: Abnormal   Collection Time   09/22/11 10:30 PM      Component Value Range   Sodium 133 (*) 135 - 145 (mEq/L)   Potassium 4.8  3.5 - 5.1 (mEq/L)   Chloride 103  96 - 112 (mEq/L)   CO2 15 (*) 19 - 32 (mEq/L)   Glucose, Bld 241 (*) 70 - 99 (mg/dL)   BUN 36 (*) 6 - 23 (mg/dL)   Creatinine, Ser 0.86 (*) 0.50 - 1.10 (mg/dL)   Calcium 9.7  8.4 - 57.8 (mg/dL)   GFR calc non Af Amer 47 (*) >90 (mL/min)   GFR calc Af Amer 55 (*) >90 (mL/min)  GLUCOSE, CAPILLARY     Status: Abnormal   Collection Time   09/22/11 11:06 PM      Component Value Range   Glucose-Capillary 206 (*) 70 - 99 (mg/dL)   Comment 1 Documented in Chart     Comment 2 Notify RN    GLUCOSE, CAPILLARY     Status: Abnormal   Collection Time   09/23/11 12:06 AM      Component Value Range   Glucose-Capillary 174 (*) 70 - 99 (mg/dL)  BASIC METABOLIC PANEL     Status: Abnormal   Collection Time   09/23/11 12:10 AM      Component Value Range   Sodium 134 (*) 135 - 145 (mEq/L)   Potassium 4.2  3.5 - 5.1 (mEq/L)   Chloride 103  96 - 112 (mEq/L)   CO2 20  19 -  32 (mEq/L)   Glucose, Bld 171 (*) 70 - 99 (mg/dL)   BUN 35 (*) 6 - 23 (mg/dL)   Creatinine, Ser 1.61 (*) 0.50 - 1.10 (mg/dL)   Calcium 9.6  8.4 - 09.6 (mg/dL)   GFR calc non Af Amer 48 (*) >90 (mL/min)   GFR calc Af Amer 56 (*) >90 (mL/min)  GLUCOSE, CAPILLARY     Status: Abnormal   Collection Time   09/23/11 12:55 AM      Component  Value Range   Glucose-Capillary 130 (*) 70 - 99 (mg/dL)  GLUCOSE, CAPILLARY     Status: Abnormal   Collection Time   09/23/11  1:53 AM      Component Value Range   Glucose-Capillary 141 (*) 70 - 99 (mg/dL)  GLUCOSE, CAPILLARY     Status: Abnormal   Collection Time   09/23/11  2:54 AM      Component Value Range   Glucose-Capillary 134 (*) 70 - 99 (mg/dL)  BASIC METABOLIC PANEL     Status: Abnormal   Collection Time   09/23/11  3:45 AM      Component Value Range   Sodium 133 (*) 135 - 145 (mEq/L)   Potassium 4.0  3.5 - 5.1 (mEq/L)   Chloride 103  96 - 112 (mEq/L)   CO2 21  19 - 32 (mEq/L)   Glucose, Bld 124 (*) 70 - 99 (mg/dL)   BUN 33 (*) 6 - 23 (mg/dL)   Creatinine, Ser 0.45 (*) 0.50 - 1.10 (mg/dL)   Calcium 9.6  8.4 - 40.9 (mg/dL)   GFR calc non Af Amer 49 (*) >90 (mL/min)   GFR calc Af Amer 57 (*) >90 (mL/min)  GLUCOSE, CAPILLARY     Status: Abnormal   Collection Time   09/23/11  3:53 AM      Component Value Range   Glucose-Capillary 124 (*) 70 - 99 (mg/dL)  GLUCOSE, CAPILLARY     Status: Abnormal   Collection Time   09/23/11  4:53 AM      Component Value Range   Glucose-Capillary 125 (*) 70 - 99 (mg/dL)  GLUCOSE, CAPILLARY     Status: Abnormal   Collection Time   09/23/11  5:50 AM      Component Value Range   Glucose-Capillary 134 (*) 70 - 99 (mg/dL)  GLUCOSE, CAPILLARY     Status: Abnormal   Collection Time   09/23/11  6:52 AM      Component Value Range   Glucose-Capillary 179 (*) 70 - 99 (mg/dL)  GLUCOSE, CAPILLARY     Status: Abnormal   Collection Time   09/23/11  8:06 AM      Component Value Range   Glucose-Capillary 196 (*) 70 - 99 (mg/dL)   Comment 1 Documented in Chart     Comment 2 Notify RN     Comment 3 Glucose Stabilizer    BASIC METABOLIC PANEL     Status: Abnormal   Collection Time   09/23/11  9:05 AM      Component Value Range   Sodium 133 (*) 135 - 145 (mEq/L)   Potassium 4.6  3.5 - 5.1 (mEq/L)   Chloride 101  96 - 112 (mEq/L)   CO2 19  19 - 32  (mEq/L)   Glucose, Bld 238 (*) 70 - 99 (mg/dL)   BUN 30 (*) 6 - 23 (mg/dL)   Creatinine, Ser 8.11 (*) 0.50 - 1.10 (mg/dL)   Calcium 9.8  8.4 - 91.4 (mg/dL)  GFR calc non Af Amer 48 (*) >90 (mL/min)   GFR calc Af Amer 55 (*) >90 (mL/min)  CBC     Status: Normal   Collection Time   09/23/11  9:05 AM      Component Value Range   WBC 7.1  4.0 - 10.5 (K/uL)   RBC 4.51  3.87 - 5.11 (MIL/uL)   Hemoglobin 12.6  12.0 - 15.0 (g/dL)   HCT 45.4  09.8 - 11.9 (%)   MCV 86.7  78.0 - 100.0 (fL)   MCH 27.9  26.0 - 34.0 (pg)   MCHC 32.2  30.0 - 36.0 (g/dL)   RDW 14.7  82.9 - 56.2 (%)   Platelets 218  150 - 400 (K/uL)  GLUCOSE, CAPILLARY     Status: Abnormal   Collection Time   09/23/11 11:58 AM      Component Value Range   Glucose-Capillary 287 (*) 70 - 99 (mg/dL)   Comment 1 Documented in Chart     Comment 2 Notify RN        Micro: Recent Results (from the past 240 hour(s))  MRSA PCR SCREENING     Status: Abnormal   Collection Time   09/22/11  7:11 PM      Component Value Range Status Comment   MRSA by PCR POSITIVE (*) NEGATIVE  Final     Studies/Results: No results found.  Medications:  Scheduled Meds:   . antiseptic oral rinse  15 mL Mouth Rinse BID  . Chlorhexidine Gluconate Cloth  6 each Topical Q0600  . enoxaparin  40 mg Subcutaneous Q24H  . famotidine  20 mg Oral BID  . fentaNYL  50 mcg Intravenous Once  . insulin aspart  0-20 Units Subcutaneous TID WC  . insulin aspart  0-5 Units Subcutaneous QHS  . insulin glargine  40 Units Subcutaneous Daily  . levothyroxine  137 mcg Oral QAC breakfast  . mupirocin ointment  1 application Nasal BID  . mycophenolate  500 mg Oral BID  . potassium chloride  10 mEq Intravenous Q1H  . potassium chloride  10 mEq Intravenous Q1H  . predniSONE  5 mg Oral Daily  . sodium chloride  2,000 mL Intravenous Once  . sodium polystyrene  30 g Oral Once  . tacrolimus  1 mg Oral BID  . DISCONTD: insulin glargine  40 Units Subcutaneous BID  .  DISCONTD: insulin glargine  40 Units Subcutaneous QHS  . DISCONTD: insulin regular  0-10 Units Intravenous TID WC   Continuous Infusions:   . sodium chloride    . sodium chloride    . dextrose 5 % and 0.45% NaCl 50 mL/hr at 09/23/11 0023  . insulin (NOVOLIN-R) infusion 0.7 Units/hr (09/23/11 0500)  . DISCONTD: sodium chloride 100 mL/hr at 09/22/11 1746  . DISCONTD: sodium chloride    . DISCONTD: sodium chloride    . DISCONTD: dextrose 5 % and 0.45% NaCl    . DISCONTD: dextrose 5 % and 0.45% NaCl    . DISCONTD: insulin (NOVOLIN-R) infusion 4.4 Units/hr (09/22/11 1641)   PRN Meds:.dextrose, diazepam, ondansetron, oxyCODONE-acetaminophen, DISCONTD: dextrose   Assessment:   1. DKA, type 1. Likely due to the patient running out of her Lantus. Bicarbonate is normal. Discontinue insulin drip  . Will advance diet.. Patient will need case management assistance with obtaining medications for the patient.  2. Hyperkalemia. Likely due to DKA. Give the patient Kayexalate 30 gm once. Continue cardiac monitoring.  3. Hyponatremia. Again likely due to DKA from  dehydration.   4. Dehydration/hypotension. Likely due to DKA. Continue aggressive fluid resuscitation.  5. Hypertension. Blood pressure borderline low at this time. Holding home antihypertensive medications at this time. If blood pressure improves could start the patient back on home medications.   6. DM (diabetes mellitus), type 1, uncontrolled with complications. Management as indicated above.   7. End stage renal disease/status post renal transplant on chronic immunosuppressive therapy. Continue home immunosuppressive regimen.  8. Hypothyroidism. Continue levothyroxine.  9. History of depression. Continue home medications.  10. Ulcer over the right shin. Does not appear to be infected at this time. Will not start any antibiotics at this time. Continue to follow closely.  11. Prophylaxis. Lovenox.  12. CODE STATUS. Full code. This was  discussed with the patient at the time of admission.     LOS: 1 day   [facility] 09/23/2011, 1:02 PM

## 2011-09-23 NOTE — Progress Notes (Signed)
09/23/11 0215- Pt c/o nausea, NP called and order received for Zofran prn. Pt vomited <40cc. Zofran given per order. Will continue to monitor. Lorelle Formosa RN

## 2011-09-23 NOTE — Progress Notes (Signed)
UR complete 

## 2011-09-23 NOTE — ED Provider Notes (Signed)
Medical screening examination/treatment/procedure(s) were conducted as a shared visit with non-physician practitioner(s) and myself.  I personally evaluated the patient during the encounter   Dione Booze, MD 09/23/11 [phone]

## 2011-09-24 LAB — BASIC METABOLIC PANEL
BUN: 19 mg/dL (ref 6–23)
CO2: 17 mEq/L — ABNORMAL LOW (ref 19–32)
Calcium: 10 mg/dL (ref 8.4–10.5)
Chloride: 103 mEq/L (ref 96–112)
Creatinine, Ser: 1.24 mg/dL — ABNORMAL HIGH (ref 0.50–1.10)
Creatinine, Ser: 1.15 mg/dL — ABNORMAL HIGH (ref 0.50–1.10)
GFR calc Af Amer: 66 mL/min — ABNORMAL LOW (ref >90)

## 2011-09-24 LAB — HEMOGLOBIN A1C: Hgb A1c MFr Bld: 10.4 % — ABNORMAL HIGH (ref <5.7)

## 2011-09-24 MED ORDER — INSULIN GLARGINE 100 UNIT/ML ~~LOC~~ SOLN
40.0000 [IU] | Freq: Two times a day (BID) | SUBCUTANEOUS | Status: DC
  Administered 2011-09-24 – 2011-09-25 (×3): 40 [IU] via SUBCUTANEOUS
  Filled 2011-09-24: qty 3

## 2011-09-24 MED ORDER — INSULIN GLARGINE 100 UNIT/ML ~~LOC~~ SOLN
10.0000 [IU] | Freq: Once | SUBCUTANEOUS | Status: AC
  Administered 2011-09-24: 10 [IU] via SUBCUTANEOUS
  Filled 2011-09-24: qty 3

## 2011-09-24 MED ORDER — INSULIN GLARGINE 100 UNIT/ML ~~LOC~~ SOLN
60.0000 [IU] | Freq: Every day | SUBCUTANEOUS | Status: DC
  Administered 2011-09-24: 60 [IU] via SUBCUTANEOUS

## 2011-09-24 NOTE — Progress Notes (Signed)
Inpatient Diabetes Recommendations    Results for MIHAELA, FAJARDO (MRN [id number]) as of 09/24/2011 11:47  Ref. Range 09/23/2011 11:58 09/23/2011 17:01 09/23/2011 18:00 09/24/2011 04:10  CO2 Latest Range: 19-32 mEq/L    17 (L)   Results for SHIVANGI, LUTZ (MRN [id number]) as of 09/24/2011 11:47  Ref. Range 09/23/2011 08:06 09/23/2011 09:05 09/23/2011 11:58 09/23/2011 17:01 09/23/2011 18:00 09/24/2011 04:10  Glucose-Capillary Latest Range: 70-99 mg/dL 782 (H)  956 (H) 213 (H) 439 (H)   Glucose Latest Range: 70-99 mg/dL  086 (H)    578 (H)    Anion gap - 14.  Restart GlucoStabilizer for DKA. Check ketones.  Ailene Ards, RD, LDN, CDE

## 2011-09-24 NOTE — Progress Notes (Signed)
Subjective: CBCs are not controlled  Objective: Vital signs in last 24 hours: Filed Vitals:   09/23/11 2140 09/24/11 0152 09/24/11 0454 09/24/11 0512  BP: 109/68 133/64 162/90 144/82  Pulse: 83 78 82   Temp: 98 F (36.7 C) 98 F (36.7 C) 97.6 F (36.4 C)   TempSrc: Oral Oral Oral   Resp: 18 18 18    Height:      Weight:      SpO2: 93% 92% 97%     Intake/Output Summary (Last 24 hours) at 09/24/11 0857 Last data filed at 09/24/11 0501  Gross per 24 hour  Intake   5718 ml  Output      0 ml  Net   5718 ml    Weight change: 6.4 kg (14 lb 1.8 oz)  HEENT: EOMI, Moist mucous membranes  Neck: Supple  CV: S1 and S2  Lungs: Clear to ascultation bilaterally  Abdomen: Soft, Nontender, Nondistended, +bowel sounds, surgical scar midline noted.  Ext: Good pulses. Trace edema. No clubbing or cyanosis noted. 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 Right lower extremity, shin area of ulceration noted with no erythema or pus. Left lower extremity shin again ulcerated but healing well.  Neuro: Cranial Nerves II-XII grossly intact. Has 5/5 motor strength in upper and lower extremities.      Lab Results: Results for orders placed during the hospital encounter of 09/22/11 (from the past 24 hour(s))  BASIC METABOLIC PANEL     Status: Abnormal   Collection Time   09/23/11  9:05 AM      Component Value Range   Sodium 133 (*) 135 - 145 (mEq/L)   Potassium 4.6  3.5 - 5.1 (mEq/L)   Chloride 101  96 - 112 (mEq/L)   CO2 19  19 - 32 (mEq/L)   Glucose, Bld 238 (*) 70 - 99 (mg/dL)   BUN 30 (*) 6 - 23 (mg/dL)   Creatinine, Ser 2.13 (*) 0.50 - 1.10 (mg/dL)   Calcium 9.8  8.4 - 08.6 (mg/dL)   GFR calc non Af Amer 48 (*) >90 (mL/min)   GFR calc Af Amer 55 (*) >90 (mL/min)  CBC     Status: Normal   Collection Time   09/23/11  9:05 AM      Component Value Range   WBC 7.1  4.0 - 10.5 (K/uL)   RBC 4.51  3.87 - 5.11 (MIL/uL)   Hemoglobin 12.6  12.0 - 15.0 (g/dL)   HCT 57.8  46.9 - 62.9 (%)   MCV 86.7  78.0 - 100.0 (fL)   MCH 27.9  26.0 - 34.0 (pg)   MCHC 32.2  30.0 - 36.0 (g/dL)   RDW 52.8  41.3 - 24.4 (%)   Platelets 218  150 - 400 (K/uL)  GLUCOSE, CAPILLARY     Status: Abnormal   Collection Time   09/23/11 11:58 AM      Component Value Range   Glucose-Capillary 287 (*) 70 - 99 (mg/dL)   Comment 1 Documented in Chart     Comment 2 Notify RN    GLUCOSE, CAPILLARY     Status: Abnormal   Collection Time   09/23/11  5:01 PM      Component Value Range   Glucose-Capillary 518 (*) 70 - 99 (mg/dL)   Comment 1 Documented in Chart     Comment 2 Notify RN    GLUCOSE, CAPILLARY     Status: Abnormal   Collection Time  09/23/11  6:00 PM      Component Value Range   Glucose-Capillary 439 (*) 70 - 99 (mg/dL)   Comment 1 Documented in Chart     Comment 2 Notify RN    BASIC METABOLIC PANEL     Status: Abnormal   Collection Time   09/24/11  4:10 AM      Component Value Range   Sodium 133 (*) 135 - 145 (mEq/L)   Potassium 5.2 (*) 3.5 - 5.1 (mEq/L)   Chloride 102  96 - 112 (mEq/L)   CO2 17 (*) 19 - 32 (mEq/L)   Glucose, Bld 373 (*) 70 - 99 (mg/dL)   BUN 24 (*) 6 - 23 (mg/dL)   Creatinine, Ser 4.78 (*) 0.50 - 1.10 (mg/dL)   Calcium 29.5  8.4 - 10.5 (mg/dL)   GFR calc non Af Amer 52 (*) >90 (mL/min)   GFR calc Af Amer 60 (*) >90 (mL/min)    Micro: Recent Results (from the past 240 hour(s))  MRSA PCR SCREENING     Status: Abnormal   Collection Time   09/22/11  7:11 PM      Component Value Range Status Comment   MRSA by PCR POSITIVE (*) NEGATIVE  Final     Studies/Results: No results found.  Medications:  Scheduled Meds:   . antiseptic oral rinse  15 mL Mouth Rinse BID  . Chlorhexidine Gluconate Cloth  6 each Topical Q0600  . enoxaparin  40 mg Subcutaneous Q24H  . famotidine  20 mg Oral BID  . insulin aspart  0-20 Units Subcutaneous TID WC  . insulin aspart  0-5 Units Subcutaneous QHS  . insulin glargine  60 Units Subcutaneous Daily  .  levothyroxine  137 mcg Oral QAC breakfast  . mupirocin ointment  1 application Nasal BID  . mycophenolate  500 mg Oral BID  . potassium chloride  10 mEq Intravenous Q1H  . predniSONE  5 mg Oral Daily  . tacrolimus  1 mg Oral BID  . DISCONTD: insulin glargine  40 Units Subcutaneous Daily   Continuous Infusions:   . sodium chloride    . DISCONTD: dextrose 5 % and 0.45% NaCl 50 mL/hr at 09/23/11 2321  . DISCONTD: insulin (NOVOLIN-R) infusion 0.7 Units/hr (09/23/11 0500)   PRN Meds:.dextrose, diazepam, ondansetron, oxyCODONE-acetaminophen   Assessment:   1. DKA, type 1. Likely due to the patient running out of her Lantus. Bicarbonate is normal. Discontinue insulin drip . Will advance diet.. Patient will need case management assistance with obtaining medications for the patient. Started pre-meal insulin. Increase Lantus to 60 units. 2. Hyperkalemia. Likely due to DKA. Give the patient Kayexalate 30 gm once. Continue cardiac monitoring.  3. Hyponatremia. Again likely due to DKA from dehydration.  4. Dehydration/hypotension. Likely due to DKA. Continue aggressive fluid resuscitation.  5. Hypertension. Blood pressure borderline low at this time. Holding home antihypertensive medications at this time. If blood pressure improves could start the patient back on home medications.  6. DM (diabetes mellitus), type 1, uncontrolled with complications. Management as indicated above.  7. End stage renal disease/status post renal transplant on chronic immunosuppressive therapy. Continue home immunosuppressive regimen.  8. Hypothyroidism. Continue levothyroxine.  9. History of depression. Continue home medications.  10. Ulcer over the right shin. Does not appear to be infected at this time. Will not start any antibiotics at this time. Continue to follow closely.  11. Prophylaxis. Lovenox.  12. CODE STATUS. Full code. This was discussed with the patient at the  time of admission.     LOS: 2 days    [facility] 09/24/2011, 8:57 AM

## 2011-09-24 NOTE — Progress Notes (Signed)
Pt BP 144/82.  Notified MD according to current orders.  MD made aware and did not give new orders.  Will continue to monitor pt. Daphene Calamity [place] 5:34 AM 09/24/2011

## 2011-09-25 LAB — GLUCOSE, CAPILLARY
Glucose-Capillary: 367 mg/dL — ABNORMAL HIGH (ref 70–99)
Glucose-Capillary: 333 mg/dL — ABNORMAL HIGH (ref 70–99)
Glucose-Capillary: 181 mg/dL — ABNORMAL HIGH (ref 70–99)
Glucose-Capillary: 177 mg/dL — ABNORMAL HIGH (ref 70–99)

## 2011-09-25 LAB — BASIC METABOLIC PANEL
BUN: 14 mg/dL (ref 6–23)
CO2: 22 mEq/L (ref 19–32)
Chloride: 108 mEq/L (ref 96–112)
GFR calc non Af Amer: 66 mL/min — ABNORMAL LOW (ref >90)
Glucose, Bld: 160 mg/dL — ABNORMAL HIGH (ref 70–99)
Potassium: 4.7 mEq/L (ref 3.5–5.1)

## 2011-09-25 NOTE — Progress Notes (Signed)
Subjective: Feeling much better today  Objective: Vital signs in last 24 hours: Filed Vitals:   09/24/11 1502 09/24/11 1801 09/24/11 2140 09/25/11 0503  BP: 169/85 158/86 153/86 149/88  Pulse: 81 80 82 88  Temp: 97.7 F (36.5 C) 98.2 F (36.8 C) 98.1 F (36.7 C) 97.5 F (36.4 C)  TempSrc: Oral Oral Oral Oral  Resp: 18 18 18    Height:      Weight:      SpO2: 91% 94% 92% 93%    Intake/Output Summary (Last 24 hours) at 09/25/11 0843 Last data filed at 09/25/11 0512  Gross per 24 hour  Intake 3161.08 ml  Output      0 ml  Net 3161.08 ml    Weight change:   HEENT: EOMI, Moist mucous membranes  Neck: Supple  CV: S1 and S2  Lungs: Clear to ascultation bilaterally  Abdomen: Soft, Nontender, Nondistended, +bowel sounds, surgical scar midline noted.  Ext: Good pulses. Trace edema. No clubbing or cyanosis noted. 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 Right lower extremity, shin area of ulceration noted with no erythema or pus. Left lower extremity shin again ulcerated but healing well.  Neuro: Cranial Nerves II-XII grossly intact. Has 5/5 motor strength in upper and lower extremities   Lab Results: Results for orders placed during the hospital encounter of 09/22/11 (from the past 24 hour(s))  HEMOGLOBIN A1C     Status: Abnormal   Collection Time   09/24/11 11:25 AM      Component Value Range   Hemoglobin A1C 10.4 (*) <5.7 (%)   Mean Plasma Glucose 252 (*) <117 (mg/dL)  GLUCOSE, CAPILLARY     Status: Abnormal   Collection Time   09/24/11 12:16 PM      Component Value Range   Glucose-Capillary 345 (*) 70 - 99 (mg/dL)  BASIC METABOLIC PANEL     Status: Abnormal   Collection Time   09/24/11  3:40 PM      Component Value Range   Sodium 133 (*) 135 - 145 (mEq/L)   Potassium 4.6  3.5 - 5.1 (mEq/L)   Chloride 103  96 - 112 (mEq/L)   CO2 20  19 - 32 (mEq/L)   Glucose, Bld 152 (*) 70 - 99 (mg/dL)   BUN 19  6 - 23 (mg/dL)   Creatinine, Ser 4.09  (*) 0.50 - 1.10 (mg/dL)   Calcium 81.1  8.4 - 10.5 (mg/dL)   GFR calc non Af Amer 57 (*) >90 (mL/min)   GFR calc Af Amer 66 (*) >90 (mL/min)  GLUCOSE, CAPILLARY     Status: Abnormal   Collection Time   09/24/11  4:24 PM      Component Value Range   Glucose-Capillary 142 (*) 70 - 99 (mg/dL)  BASIC METABOLIC PANEL     Status: Abnormal   Collection Time   09/25/11  3:38 AM      Component Value Range   Sodium 137  135 - 145 (mEq/L)   Potassium 4.7  3.5 - 5.1 (mEq/L)   Chloride 108  96 - 112 (mEq/L)   CO2 22  19 - 32 (mEq/L)   Glucose, Bld 160 (*) 70 - 99 (mg/dL)   BUN 14  6 - 23 (mg/dL)   Creatinine, Ser 9.14  0.50 - 1.10 (mg/dL)   Calcium 9.4  8.4 - 78.2 (mg/dL)   GFR calc non Af Amer 66 (*) >90 (mL/min)   GFR calc Af Amer 76 (*) >  90 (mL/min)  GLUCOSE, CAPILLARY     Status: Abnormal   Collection Time   09/25/11  7:32 AM      Component Value Range   Glucose-Capillary 182 (*) 70 - 99 (mg/dL)     Micro: Recent Results (from the past 240 hour(s))  MRSA PCR SCREENING     Status: Abnormal   Collection Time   09/22/11  7:11 PM      Component Value Range Status Comment   MRSA by PCR POSITIVE (*) NEGATIVE  Final     Studies/Results: No results found.  Medications: Scheduled Meds:   . antiseptic oral rinse  15 mL Mouth Rinse BID  . Chlorhexidine Gluconate Cloth  6 each Topical Q0600  . enoxaparin  40 mg Subcutaneous Q24H  . famotidine  20 mg Oral BID  . insulin aspart  0-20 Units Subcutaneous TID WC  . insulin aspart  0-5 Units Subcutaneous QHS  . insulin glargine  10 Units Subcutaneous Once  . insulin glargine  40 Units Subcutaneous BID  . levothyroxine  137 mcg Oral QAC breakfast  . mupirocin ointment  1 application Nasal BID  . mycophenolate  500 mg Oral BID  . predniSONE  5 mg Oral Daily  . tacrolimus  1 mg Oral BID  . DISCONTD: insulin glargine  40 Units Subcutaneous Daily  . DISCONTD: insulin glargine  60 Units Subcutaneous Daily   Continuous Infusions:   . sodium  chloride 125 mL/hr at 09/25/11 0709  . DISCONTD: dextrose 5 % and 0.45% NaCl 50 mL/hr at 09/23/11 2321  . DISCONTD: insulin (NOVOLIN-R) infusion 0.7 Units/hr (09/23/11 0500)   PRN Meds:.dextrose, diazepam, ondansetron, oxyCODONE-acetaminophen   Assessment:   1. DKA, type 1. Likely due to the patient running out of her Lantus. Bicarbonate is normal. CBC improve significantly with twice a day dosing of Lantus Started pre-meal insulin.  2. Hyperkalemia. Likely due to DKA. Now resolved  3. Hyponatremia. Again likely due to DKA from dehydration.  4. Dehydration/hypotension. Likely due to DKA. Continue aggressive fluid resuscitation.  5. Hypertension. Blood pressure borderline low at this time. Holding home antihypertensive medications at this time. If blood pressure improves could start the patient back on home medications.  6. DM (diabetes mellitus), type 1, uncontrolled with complications. Management as indicated above.   7. End stage renal disease/status post renal transplant on chronic immunosuppressive therapy. Continue home immunosuppressive regimen.   8. Hypothyroidism. Continue levothyroxine.  9. History of depression. Continue home medications.  10. Ulcer over the right shin. Does not appear to be infected at this time. Will not start any antibiotics at this time. Continue to follow closely.  11. Prophylaxis. Lovenox.  12. CODE STATUS. Full code. This was discussed with the patient at the time of admission.    Disposition likely monitor her CBGs today and anticipate discharge tomorrow    LOS: 3 days   [facility] 09/25/2011, 8:43 AM

## 2011-09-25 NOTE — Progress Notes (Signed)
Spoke with patient again today. Concerns about affordability of Lantus. Discussed options of alternate pharmacy such as getting a script and having it filled at [facility] pharmacy, gets other meds thru [facility] pharmacy at a discount, may be able to get Lantus through them more cheaply. Patient to look at changing insurance providers. Also discussed asking MD about a change in regimen that would not include Lantus.

## 2011-09-26 LAB — BASIC METABOLIC PANEL
BUN: 10 mg/dL (ref 6–23)
CO2: 23 mEq/L (ref 19–32)
Calcium: 10 mg/dL (ref 8.4–10.5)
Creatinine, Ser: 1.08 mg/dL (ref 0.50–1.10)

## 2011-09-26 LAB — GLUCOSE, CAPILLARY
Glucose-Capillary: 59 mg/dL — ABNORMAL LOW (ref 70–99)
Glucose-Capillary: 58 mg/dL — ABNORMAL LOW (ref 70–99)
Glucose-Capillary: 60 mg/dL — ABNORMAL LOW (ref 70–99)
Glucose-Capillary: 158 mg/dL — ABNORMAL HIGH (ref 70–99)

## 2011-09-26 MED ORDER — INSULIN GLARGINE 100 UNIT/ML ~~LOC~~ SOLN: SUBCUTANEOUS | Status: DC

## 2011-09-26 NOTE — Discharge Summary (Signed)
Physician Discharge Summary  ALAYSIAH BROWDER MRN: [id number] DOB/AGE: May 03, 1966 46 y.o.  PCP: [name], MD, MD   Admit date: 09/22/2011 Discharge date: 09/26/2011  Discharge Diagnoses:   *DKA, type 1    HYPERTENSION  DM (diabetes mellitus), type 1, uncontrolled with complications  Hypertension  Immunosuppression  End stage renal disease  Depression  Hypothyroid  Hyperkalemia  Hyponatremia  Dehydration  Hypotension   Current Discharge Medication List    CONTINUE these medications which have CHANGED   Details  insulin glargine (LANTUS) 100 UNIT/ML injection 40 units in the morning and 30 units in the evening Qty: 100 mL, Refills: 50      CONTINUE these medications which have NOT CHANGED   Details  amLODipine (NORVASC) 5 MG tablet Take 5 mg by mouth daily.      carvedilol (COREG) 25 MG tablet Take 50 mg by mouth 2 (two) times daily.     insulin aspart (NOVOLOG) 100 UNIT/ML injection Inject 8 Units into the skin 3 (three) times daily before meals.     levothyroxine (SYNTHROID, LEVOTHROID) 137 MCG tablet Take 137 mcg by mouth daily. Name brand    mycophenolate (CELLCEPT) 250 MG capsule Take 500 mg by mouth 2 (two) times daily.     predniSONE (DELTASONE) 5 MG tablet Take 5 mg by mouth daily.      Pseudoephedrine-APAP-DM (TYLENOL COLD/FLU SEVERE DAY PO) Take by mouth.      ranitidine (ZANTAC) 150 MG capsule Take 150 mg by mouth daily.     tacrolimus (PROGRAF) 1 MG capsule Take 1 mg by mouth 2 (two) times daily.       STOP taking these medications     diazepam (VALIUM) 5 MG tablet      oxyCODONE-acetaminophen (PERCOCET) 5-325 MG per tablet         Discharge Condition stable Disposition: Home or Self Care   Consults: None Significant Diagnostic Studies: Dg Lumbar Spine Complete  09/13/2011  *RADIOLOGY REPORT*  Clinical Data: Low back pain, prior back surgery  LUMBAR SPINE - COMPLETE 4+ VIEW  Comparison: 03/08/2007  Findings: Five  lumbar-type vertebral bodies.  No evidence of fracture or dislocation.  Vertebral body heights are maintained.  Mild multilevel degenerative changes, most prominent at L4-5.  Vascular calcifications.  IMPRESSION: No fracture or dislocation is seen.  Original Report Authenticated By: Charline Bills, M.D.     Microbiology: Recent Results (from the past 240 hour(s))  MRSA PCR SCREENING     Status: Abnormal   Collection Time   09/22/11  7:11 PM      Component Value Range Status Comment   MRSA by PCR POSITIVE (*) NEGATIVE  Final      Labs: Results for orders placed during the hospital encounter of 09/22/11 (from the past 48 hour(s))  HEMOGLOBIN A1C     Status: Abnormal   Collection Time   09/24/11 11:25 AM      Component Value Range Comment   Hemoglobin A1C 10.4 (*) <5.7 (%)    Mean Plasma Glucose 252 (*) <117 (mg/dL)   GLUCOSE, CAPILLARY     Status: Abnormal   Collection Time   09/24/11 12:16 PM      Component Value Range Comment   Glucose-Capillary 345 (*) 70 - 99 (mg/dL)   BASIC METABOLIC PANEL     Status: Abnormal   Collection Time   09/24/11  3:40 PM      Component Value Range Comment   Sodium 133 (*) 135 - 145 (  mEq/L)    Potassium 4.6  3.5 - 5.1 (mEq/L)    Chloride 103  96 - 112 (mEq/L)    CO2 20  19 - 32 (mEq/L)    Glucose, Bld 152 (*) 70 - 99 (mg/dL)    BUN 19  6 - 23 (mg/dL)    Creatinine, Ser 1.61 (*) 0.50 - 1.10 (mg/dL)    Calcium 09.6  8.4 - 10.5 (mg/dL)    GFR calc non Af Amer 57 (*) >90 (mL/min)    GFR calc Af Amer 66 (*) >90 (mL/min)   GLUCOSE, CAPILLARY     Status: Abnormal   Collection Time   09/24/11  4:24 PM      Component Value Range Comment   Glucose-Capillary 142 (*) 70 - 99 (mg/dL)   GLUCOSE, CAPILLARY     Status: Abnormal   Collection Time   09/24/11  9:38 PM      Component Value Range Comment   Glucose-Capillary 269 (*) 70 - 99 (mg/dL)    Comment 1 Notify RN     BASIC METABOLIC PANEL     Status: Abnormal   Collection Time   09/25/11  3:38 AM       Component Value Range Comment   Sodium 137  135 - 145 (mEq/L)    Potassium 4.7  3.5 - 5.1 (mEq/L) NO VISIBLE HEMOLYSIS   Chloride 108  96 - 112 (mEq/L)    CO2 22  19 - 32 (mEq/L)    Glucose, Bld 160 (*) 70 - 99 (mg/dL)    BUN 14  6 - 23 (mg/dL)    Creatinine, Ser 0.45  0.50 - 1.10 (mg/dL)    Calcium 9.4  8.4 - 10.5 (mg/dL)    GFR calc non Af Amer 66 (*) >90 (mL/min)    GFR calc Af Amer 76 (*) >90 (mL/min)   GLUCOSE, CAPILLARY     Status: Abnormal   Collection Time   09/25/11  7:32 AM      Component Value Range Comment   Glucose-Capillary 182 (*) 70 - 99 (mg/dL)   GLUCOSE, CAPILLARY     Status: Abnormal   Collection Time   09/25/11 12:23 PM      Component Value Range Comment   Glucose-Capillary 181 (*) 70 - 99 (mg/dL)   GLUCOSE, CAPILLARY     Status: Abnormal   Collection Time   09/25/11  5:35 PM      Component Value Range Comment   Glucose-Capillary 177 (*) 70 - 99 (mg/dL)    Comment 1 Notify RN     GLUCOSE, CAPILLARY     Status: Abnormal   Collection Time   09/25/11 10:29 PM      Component Value Range Comment   Glucose-Capillary 142 (*) 70 - 99 (mg/dL)    Comment 1 Notify RN     BASIC METABOLIC PANEL     Status: Abnormal   Collection Time   09/26/11  3:35 AM      Component Value Range Comment   Sodium 137  135 - 145 (mEq/L)    Potassium 4.6  3.5 - 5.1 (mEq/L)    Chloride 105  96 - 112 (mEq/L)    CO2 23  19 - 32 (mEq/L)    Glucose, Bld 75  70 - 99 (mg/dL)    BUN 10  6 - 23 (mg/dL)    Creatinine, Ser 4.09  0.50 - 1.10 (mg/dL)    Calcium 81.1  8.4 - 10.5 (mg/dL)  GFR calc non Af Amer 61 (*) >90 (mL/min)    GFR calc Af Amer 71 (*) >90 (mL/min)   GLUCOSE, CAPILLARY     Status: Abnormal   Collection Time   09/26/11  6:08 AM      Component Value Range Comment   Glucose-Capillary 59 (*) 70 - 99 (mg/dL)    Comment 1 Notify RN     GLUCOSE, CAPILLARY     Status: Abnormal   Collection Time   09/26/11  6:35 AM      Component Value Range Comment   Glucose-Capillary 58 (*) 70 -  99 (mg/dL)   GLUCOSE, CAPILLARY     Status: Abnormal   Collection Time   09/26/11  6:57 AM      Component Value Range Comment   Glucose-Capillary 60 (*) 70 - 99 (mg/dL)      HPI :46 y.o. Caucasian female with history of diabetes on insulin, end stage renal disease status post renal transplant on chronic immunosuppressive therapy, hypertension, hypothyroidism, and depression who presents with complaint of elevated CBGs at home. Reports that about about 3 days ago she ran out of her Lantus and had been taking only NovoLog. She has had increased urination and increased thirst as a result presented to the emergency department for further evaluation. In the medicine department patient was found to be in DKA with blood sugars greater than 670. Patient was also found to be hyponatremic and hyperkalemic. The hospitalist service was asked to admit the patient for further care and management. Denies any recent fevers at home, denies any chills, denies any chest pain, shortness of breath, abdominal pain, diarrhea, headaches or vision changes. Reports that on the right shin there was a blister which popped open.  HOSPITAL COURSE:  . #1 DKA, type 1. Likely due to the patient running out of her Lantus. Upon admission anion gap was 20. She was started on a DKA protocol. She was hydrated aggressively with IV fluids. No obvious precipitating cause of the DKA was found except for medical noncompliance. The patient was transitioned to Lantus 40 units twice a day. On this regimen the patient was noted to have early morning hypoglycemia. I have decreased her bedtime Lantus to 30 units, she will continue to use 8 units of NovoLog before meals. At this time she is not being transitioned to NPH because of need for better control of her CBGs at baseline, and because she saw labile  2. Hyperkalemia. Likely due to DKA. She was treated with Kayexalate 30 gm once. She was maintained on telemetry, it remained stable.  3.  Hyponatremia. Again likely due to DKA from dehydration. No result 4. Dehydration/hypotension. Likely due to DKA. She was treated with aggressive fluid resuscitation.  5. Hypertension. Blood pressure borderline low at this time. Initially her antihypertensive medications were held and are being resumed at the time of discharge.  6. DM (diabetes mellitus), type 1, uncontrolled with complications. Management as indicated above.  7. End stage renal disease/status post renal transplant on chronic immunosuppressive therapy. She was continued on her chronic immunosuppression regimen 8. Hypothyroidism. Continue levothyroxine.  9. History of depression. Continue home medications.  10. Ulcer over the right shin. This did  not appear to be infected . This was monitored closely without any specific antibiotic treatment.  Discharge Exam:   Blood pressure 161/83, pulse 76, temperature 97.9 F (36.6 C), temperature source Oral, resp. rate 18, height 5\' 11"  (1.803 m), weight 117 kg (257 lb 15 oz), last  menstrual period 09/15/2011, SpO2 93.00%.   HEENT: EOMI, Moist mucous membranes  Neck: Supple  CV: S1 and S2  Lungs: Clear to ascultation bilaterally  Abdomen: Soft, Nontender, Nondistended, +bowel sounds, surgical scar midline noted.  Ext: Good pulses. Trace edema. No clubbing or cyanosis noted. 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 Right lower extremity, shin area of ulceration noted with no erythema or pus. Left lower extremity shin again ulcerated but healing well.  Neuro: Cranial Nerves II-XII grossly intact. Has 5/5 motor strength in upper and lower extremities      Discharge Orders    Future Orders Please Complete By Expires   Diet - low sodium heart healthy      Increase activity slowly      Call MD for:  temperature >100.4      Call MD for:  severe uncontrolled pain      Call MD for:  persistant dizziness or light-headedness         Follow-up Information     Follow up with COLADONATO,JOSEPH A, MD in 1 week.      Follow up with PCP in 1 week.         SignedRicharda Overlie 09/26/2011, 8:42 AM

## 2011-11-20 ENCOUNTER — Encounter (HOSPITAL_COMMUNITY): Payer: Self-pay | Admitting: *Deleted

## 2011-11-20 ENCOUNTER — Emergency Department (HOSPITAL_COMMUNITY): Payer: Medicare Other

## 2011-11-20 ENCOUNTER — Inpatient Hospital Stay (HOSPITAL_COMMUNITY)
Admission: EM | Admit: 2011-11-20 | Discharge: 2011-11-22 | DRG: 637 | Disposition: A | Discharge status: Home / Self Care | Payer: Medicare Other | Attending: Internal Medicine | Admitting: Internal Medicine

## 2011-11-20 DIAGNOSIS — N179 Acute kidney failure, unspecified: Secondary | ICD-10-CM | POA: Diagnosis present

## 2011-11-20 DIAGNOSIS — J189 Pneumonia, unspecified organism: Secondary | ICD-10-CM | POA: Diagnosis present

## 2011-11-20 DIAGNOSIS — E871 Hypo-osmolality and hyponatremia: Secondary | ICD-10-CM | POA: Diagnosis present

## 2011-11-20 DIAGNOSIS — S98139A Complete traumatic amputation of one unspecified lesser toe, initial encounter: Secondary | ICD-10-CM

## 2011-11-20 DIAGNOSIS — E1110 Type 2 diabetes mellitus with ketoacidosis without coma: Secondary | ICD-10-CM | POA: Diagnosis present

## 2011-11-20 DIAGNOSIS — N186 End stage renal disease: Secondary | ICD-10-CM | POA: Diagnosis present

## 2011-11-20 DIAGNOSIS — I120 Hypertensive chronic kidney disease with stage 5 chronic kidney disease or end stage renal disease: Secondary | ICD-10-CM | POA: Diagnosis present

## 2011-11-20 DIAGNOSIS — I10 Essential (primary) hypertension: Secondary | ICD-10-CM | POA: Diagnosis present

## 2011-11-20 DIAGNOSIS — E039 Hypothyroidism, unspecified: Secondary | ICD-10-CM | POA: Diagnosis present

## 2011-11-20 DIAGNOSIS — E1310 Other specified diabetes mellitus with ketoacidosis without coma: Principal | ICD-10-CM | POA: Diagnosis present

## 2011-11-20 DIAGNOSIS — Z940 Kidney transplant status: Secondary | ICD-10-CM

## 2011-11-20 LAB — DIFFERENTIAL
Basophils Absolute: 0 10*3/uL (ref 0.0–0.1)
Eosinophils Absolute: 0 10*3/uL (ref 0.0–0.7)
Eosinophils Relative: 0 % (ref 0–5)
Monocytes Absolute: 1 10*3/uL (ref 0.1–1.0)

## 2011-11-20 LAB — BASIC METABOLIC PANEL
BUN: 46 mg/dL — ABNORMAL HIGH (ref 6–23)
CO2: 20 mEq/L (ref 19–32)
CO2: 20 mEq/L (ref 19–32)
Calcium: 10.9 mg/dL — ABNORMAL HIGH (ref 8.4–10.5)
Calcium: 10.5 mg/dL (ref 8.4–10.5)
Creatinine, Ser: 1.48 mg/dL — ABNORMAL HIGH (ref 0.50–1.10)
Creatinine, Ser: 1.19 mg/dL — ABNORMAL HIGH (ref 0.50–1.10)
GFR calc Af Amer: 63 mL/min — ABNORMAL LOW (ref >90)
GFR calc non Af Amer: 42 mL/min — ABNORMAL LOW (ref >90)
GFR calc non Af Amer: 55 mL/min — ABNORMAL LOW (ref >90)
Glucose, Bld: 327 mg/dL — ABNORMAL HIGH (ref 70–99)
Potassium: 4.6 mEq/L (ref 3.5–5.1)
Sodium: 127 mEq/L — ABNORMAL LOW (ref 135–145)
Sodium: 133 mEq/L — ABNORMAL LOW (ref 135–145)

## 2011-11-20 LAB — URINALYSIS, ROUTINE W REFLEX MICROSCOPIC
Ketones, ur: 15 mg/dL — AB
Protein, ur: NEGATIVE mg/dL
Urobilinogen, UA: 0.2 mg/dL (ref 0.0–1.0)

## 2011-11-20 LAB — GLUCOSE, CAPILLARY
Glucose-Capillary: 399 mg/dL — ABNORMAL HIGH (ref 70–99)
Glucose-Capillary: 418 mg/dL — ABNORMAL HIGH (ref 70–99)
Glucose-Capillary: 176 mg/dL — ABNORMAL HIGH (ref 70–99)
Glucose-Capillary: 208 mg/dL — ABNORMAL HIGH (ref 70–99)

## 2011-11-20 LAB — CBC
HCT: 37.4 % (ref 36.0–46.0)
Hemoglobin: 12 g/dL (ref 12.0–15.0)
MCH: 28.1 pg (ref 26.0–34.0)
MCHC: 33 g/dL (ref 30.0–36.0)
MCV: 85.2 fL (ref 78.0–100.0)
MCV: 85 fL (ref 78.0–100.0)
Platelets: 226 10*3/uL (ref 150–400)
RDW: 13.5 % (ref 11.5–15.5)
RDW: 13.4 % (ref 11.5–15.5)
WBC: 11 10*3/uL — ABNORMAL HIGH (ref 4.0–10.5)

## 2011-11-20 LAB — URINE MICROSCOPIC-ADD ON

## 2011-11-20 MED ORDER — SODIUM CHLORIDE 0.9 % IV SOLN: INTRAVENOUS | Status: AC

## 2011-11-20 MED ORDER — MYCOPHENOLATE MOFETIL 250 MG PO CAPS
500.0000 mg | ORAL_CAPSULE | Freq: Two times a day (BID) | ORAL | Status: DC
  Administered 2011-11-20 – 2011-11-22 (×4): 500 mg via ORAL
  Filled 2011-11-20 (×5): qty 2

## 2011-11-20 MED ORDER — LEVOFLOXACIN IN D5W 750 MG/150ML IV SOLN
750.0000 mg | INTRAVENOUS | Status: DC
  Administered 2011-11-20 – 2011-11-21 (×2): 750 mg via INTRAVENOUS
  Filled 2011-11-20 (×3): qty 150

## 2011-11-20 MED ORDER — DEXTROSE 5 % IV SOLN
2.0000 g | Freq: Two times a day (BID) | INTRAVENOUS | Status: DC
  Administered 2011-11-20 – 2011-11-21 (×2): 2 g via INTRAVENOUS
  Filled 2011-11-20 (×3): qty 2

## 2011-11-20 MED ORDER — DEXTROSE 50 % IV SOLN
25.0000 mL | INTRAVENOUS | Status: DC | PRN
  Filled 2011-11-20: qty 50

## 2011-11-20 MED ORDER — SODIUM CHLORIDE 0.9 % IV SOLN
Freq: Once | INTRAVENOUS | Status: AC
  Administered 2011-11-20: 13:00:00 via INTRAVENOUS

## 2011-11-20 MED ORDER — HYDROMORPHONE HCL PF 1 MG/ML IJ SOLN
1.0000 mg | Freq: Once | INTRAMUSCULAR | Status: AC
  Administered 2011-11-20: 1 mg via INTRAVENOUS
  Filled 2011-11-20: qty 1

## 2011-11-20 MED ORDER — INSULIN ASPART 100 UNIT/ML ~~LOC~~ SOLN: 5.0000 [IU] | Freq: Once | SUBCUTANEOUS | Status: DC

## 2011-11-20 MED ORDER — CALCIUM POLYCARBOPHIL 625 MG PO TABS
1250.0000 mg | ORAL_TABLET | Freq: Every day | ORAL | Status: DC
  Administered 2011-11-20 – 2011-11-22 (×3): 1250 mg via ORAL
  Filled 2011-11-20 (×3): qty 2

## 2011-11-20 MED ORDER — PREDNISONE 5 MG PO TABS
5.0000 mg | ORAL_TABLET | Freq: Every day | ORAL | Status: DC
  Administered 2011-11-20 – 2011-11-21 (×2): 5 mg via ORAL
  Filled 2011-11-20 (×3): qty 1

## 2011-11-20 MED ORDER — SODIUM CHLORIDE 0.9 % IV SOLN
INTRAVENOUS | Status: DC
  Administered 2011-11-20: 3.6 [IU]/h via INTRAVENOUS
  Filled 2011-11-20: qty 1

## 2011-11-20 MED ORDER — INSULIN ASPART 100 UNIT/ML ~~LOC~~ SOLN
5.0000 [IU] | Freq: Once | SUBCUTANEOUS | Status: AC
  Administered 2011-11-20: 5 [IU] via INTRAVENOUS
  Filled 2011-11-20: qty 1

## 2011-11-20 MED ORDER — FAMOTIDINE 20 MG PO TABS
20.0000 mg | ORAL_TABLET | Freq: Every day | ORAL | Status: DC
  Administered 2011-11-20 – 2011-11-22 (×3): 20 mg via ORAL
  Filled 2011-11-20 (×3): qty 1

## 2011-11-20 MED ORDER — HEPARIN SODIUM (PORCINE) 5000 UNIT/ML IJ SOLN
5000.0000 [IU] | Freq: Three times a day (TID) | INTRAMUSCULAR | Status: DC
  Administered 2011-11-20 – 2011-11-22 (×5): 5000 [IU] via SUBCUTANEOUS
  Filled 2011-11-20 (×8): qty 1

## 2011-11-20 MED ORDER — ONDANSETRON HCL 4 MG/2ML IJ SOLN
4.0000 mg | Freq: Once | INTRAMUSCULAR | Status: AC
  Administered 2011-11-20: 4 mg via INTRAVENOUS
  Filled 2011-11-20: qty 2

## 2011-11-20 MED ORDER — IPRATROPIUM BROMIDE 0.02 % IN SOLN
0.5000 mg | Freq: Once | RESPIRATORY_TRACT | Status: AC
  Administered 2011-11-20: 0.5 mg via RESPIRATORY_TRACT
  Filled 2011-11-20: qty 2.5

## 2011-11-20 MED ORDER — SODIUM CHLORIDE 0.9 % IV BOLUS (SEPSIS)
1000.0000 mL | Freq: Once | INTRAVENOUS | Status: AC
  Administered 2011-11-20: 1000 mL via INTRAVENOUS

## 2011-11-20 MED ORDER — VANCOMYCIN HCL 1000 MG IV SOLR
1500.0000 mg | INTRAVENOUS | Status: DC
  Administered 2011-11-20: 1500 mg via INTRAVENOUS
  Filled 2011-11-20 (×2): qty 1500

## 2011-11-20 MED ORDER — DEXTROSE 5 % IV SOLN: 1.0000 g | Freq: Three times a day (TID) | INTRAVENOUS | Status: DC

## 2011-11-20 MED ORDER — HYDROCODONE-ACETAMINOPHEN 5-325 MG PO TABS
1.0000 | ORAL_TABLET | ORAL | Status: DC | PRN
  Administered 2011-11-20 – 2011-11-22 (×6): 1 via ORAL
  Filled 2011-11-20 (×6): qty 1

## 2011-11-20 MED ORDER — ALBUTEROL SULFATE (5 MG/ML) 0.5% IN NEBU
2.5000 mg | INHALATION_SOLUTION | Freq: Once | RESPIRATORY_TRACT | Status: AC
  Administered 2011-11-20: 2.5 mg via RESPIRATORY_TRACT
  Filled 2011-11-20: qty 0.5

## 2011-11-20 MED ORDER — GUAIFENESIN-CODEINE 100-10 MG/5ML PO SOLN
5.0000 mL | ORAL | Status: DC | PRN
  Administered 2011-11-21 – 2011-11-22 (×7): 5 mL via ORAL
  Filled 2011-11-20 (×7): qty 5

## 2011-11-20 MED ORDER — DEXTROSE-NACL 5-0.45 % IV SOLN: INTRAVENOUS | Status: DC

## 2011-11-20 MED ORDER — SODIUM CHLORIDE 0.9 % IV SOLN: INTRAVENOUS | Status: DC

## 2011-11-20 MED ORDER — TACROLIMUS 1 MG PO CAPS
1.0000 mg | ORAL_CAPSULE | Freq: Two times a day (BID) | ORAL | Status: DC
  Administered 2011-11-20 – 2011-11-22 (×4): 1 mg via ORAL
  Filled 2011-11-20 (×5): qty 1

## 2011-11-20 MED ORDER — LEVOTHYROXINE SODIUM 137 MCG PO TABS
137.0000 ug | ORAL_TABLET | Freq: Every day | ORAL | Status: DC
  Administered 2011-11-20 – 2011-11-22 (×3): 137 ug via ORAL
  Filled 2011-11-20 (×3): qty 1

## 2011-11-20 MED ORDER — DEXTROSE-NACL 5-0.45 % IV SOLN
INTRAVENOUS | Status: DC
  Administered 2011-11-20 – 2011-11-21 (×3): via INTRAVENOUS

## 2011-11-20 MED ORDER — INSULIN REGULAR HUMAN 100 UNIT/ML IJ SOLN
5.0000 [IU] | Freq: Once | INTRAMUSCULAR | Status: DC
  Filled 2011-11-20: qty 0.05

## 2011-11-20 MED ORDER — SODIUM CHLORIDE 0.9 % IV SOLN
INTRAVENOUS | Status: DC
  Administered 2011-11-21: 02:00:00 via INTRAVENOUS
  Filled 2011-11-20 (×2): qty 1

## 2011-11-20 MED ORDER — SODIUM CHLORIDE 0.9 % IV SOLN
INTRAVENOUS | Status: DC
  Administered 2011-11-20: 16:00:00 via INTRAVENOUS

## 2011-11-20 NOTE — H&P (Signed)
Hospital Admission Note Date: 11/20/2011  PCP: [name], MD, MD  Chief Complaint: Dizziness  History of Present Illness:  This is a 46 year old female with past medical history of renal transplant done in April 2011, diabetes type 2 with a last hemoglobin A1c of 10.4 back in January 2013, hypertension comes in with dizziness cough. She relates that 3 days prior to admission she started getting cough which progressively gotten worst. She relates that there have been sick contacts around her with cough and fevers. She denies any fevers. Then 2 days prior to admission she started getting short of breath to the point where she can walk 100 feet without being short of breath. She relates she has lost her appetite she has not been able to eat. She relates no fevers, but some nausea. She has been taking her insulin regularly, both from Lantus and short-acting insulin. But despite that her blood glucose kept increasing. This morning when she woke up she started getting dizzy so she decided to come to the ED. Here in the ED a chest x-ray was done that was suspicious for pneumonia, she also has a white count mild anion gap hyponatremic and acute renal failure and a blood glucose of 571. Allergies: Ace inhibitors and Adhesive Past Medical History  Diagnosis Date  . Diabetes mellitus   . Hypertension   . Kidney disease   . Hypothyroid   . Depression   . End stage renal disease     right arm AV graft, post transplant  . 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 Authorizing Provider  amLODipine (NORVASC) 5 MG tablet Take 5 mg by mouth daily.     Yes Historical Provider, MD  carvedilol (COREG) 25 MG tablet Take 50 mg by mouth 2 (two) times daily.    Yes Historical Provider, MD  insulin aspart (NOVOLOG) 100 UNIT/ML injection Inject 8-14 Units into the skin 3 (three) times daily before meals. Sliding scale  insulin; 8 units base, then add 2 units for each 100 score on CBG over 100. (200-299 = 10u; 300-399 = 12u; etc)   Yes Historical Provider, MD  insulin glargine (LANTUS) 100 UNIT/ML injection 40 units in the morning and 30 units in the evening 09/26/11  Yes Richarda Overlie, MD  levothyroxine (SYNTHROID, LEVOTHROID) 137 MCG tablet Take 137 mcg by mouth daily. Name brand   Yes Historical Provider, MD  mycophenolate (CELLCEPT) 250 MG capsule Take 500 mg by mouth 2 (two) times daily.    Yes Historical Provider, MD  predniSONE (DELTASONE) 5 MG tablet Take 5 mg by mouth daily.     Yes Historical Provider, MD  Pseudoephedrine-APAP-DM (TYLENOL COLD/FLU SEVERE DAY PO) Take 1 capsule by mouth every 6 (six) hours as needed. For cold symptoms.   Yes Historical Provider, MD  ranitidine (ZANTAC) 150 MG capsule Take 150 mg by mouth daily.    Yes Historical Provider, MD  tacrolimus (PROGRAF) 1 MG capsule Take 1 mg by mouth 2 (two) times daily.    Yes Historical Provider, MD   Past Surgical History  Procedure Date  . Back surgery 2001  . Tubal ligation   . Dg av dialysis graft declot or   . Nephrectomy transplanted organ    Family History  Problem Relation Age of Onset  . Emphysema Mother   . Throat cancer Mother   . COPD Mother   . Cancer Mother   .  Emphysema Father   . COPD Father   . Stroke Father    History   Social History  . Marital Status: Married    Spouse Name: N/A    Number of Children: N/A  . Years of Education: N/A   Occupational History  . Not on file.   Social History Main Topics  . Smoking status: Former Smoker -- 1.0 packs/day for 11 years    Types: Cigarettes    Quit date: 09/21/1998  . Smokeless tobacco: Never Used  . Alcohol Use: No  . Drug Use: No  . Sexually Active: No   Other Topics Concern  . Not on file   Social History Narrative  . No narrative on file    REVIEW OF SYSTEMS:  Constitutional:  No weight loss, night sweats, Fevers, chills, fatigue.  HEENT:  No  headaches, Difficulty swallowing,Tooth/dental problems,Sore throat,  No sneezing, itching, ear ache, nasal congestion, post nasal drip,  Cardio-vascular:  No chest pain, Orthopnea, PND, swelling in lower extremities, anasarca, dizziness, palpitations  GI:  No heartburn, indigestion, abdominal pain, nausea, vomiting, diarrhea, change in bowel habits, loss of appetite  Resp:  No shortness of breath with exertion or at rest. No excess mucus, no productive cough, No non-productive cough, No coughing up of blood.No change in color of mucus.No wheezing.No chest wall deformity  Skin:  no rash or lesions.  GU:  no dysuria, change in color of urine, no urgency or frequency. No flank pain.  Musculoskeletal:  No joint pain or swelling. No decreased range of motion. No back pain.  Psych:  No change in mood or affect. No depression or anxiety. No memory loss.   Physical Exam: Filed Vitals:   11/20/11 1127 11/20/11 1129 11/20/11 1210 11/20/11 1300  BP: 124/51 99/35 120/60   Pulse: 80 81 87 88  Temp:      TempSrc:      Resp:  20 20 18   Height:      Weight:      SpO2:  96% 92% 96%   No intake or output data in the 24 hours ending 11/20/11 1354 BP 120/60  Pulse 88  Temp(Src) 98.9 F (37.2 C) (Oral)  Resp 18  Ht 5\' 10"  (1.778 m)  Wt 117.935 kg (260 lb)  BMI 37.31 kg/m2  SpO2 96%  LMP 11/19/2011  General Appearance:    Alert, cooperative, no distress, appears stated age. Morbidly obese   Head:    Normocephalic, without obvious abnormality, atraumatic  Eyes:    PERRL, conjunctiva/corneas clear, EOM's intact, fundi    benign, both eyes       Ears:    Normal TM's and external ear canals, both ears  Nose:   Nares normal, septum midline, mucosa normal, no drainage    or sinus tenderness  Throat:   poor oral hygiene with dry mucous membrane.   Neck:   Supple, symmetrical, trachea midline, no adenopathy;       thyroid:  No enlargement/tenderness/nodules; no carotid   bruit or JVD  Back:      Symmetric, no curvature, ROM normal, no CVA tenderness  Lungs:     ongoing cough, good air movement with crackles on the right.   Chest wall:    No tenderness or deformity  Heart:    Regular rate and rhythm, S1 and S2 normal, no murmur, rub   or gallop  Abdomen:     Soft, non-tender, bowel sounds active all four quadrants,    no masses,  no organomegaly        Extremities:   Extremities normal, atraumatic, no cyanosis or edema  Pulses:   2+ and symmetric all extremities  Skin:   Skin color, texture, turgor normal, no rashes or lesions  Lymph nodes:   Cervical, supraclavicular, and axillary nodes normal  Neurologic:   CNII-XII intact. Normal strength, sensation and reflexes      throughout   Lab results:  Basename 11/20/11 1000  NA 127*  K 4.9  CL 93*  CO2 18*  GLUCOSE 571*  BUN 54*  CREATININE 1.48*  CALCIUM 10.9*  MG --  PHOS --   No results found for this basename: AST:2,ALT:2,ALKPHOS:2,BILITOT:2,PROT:2,ALBUMIN:2 in the last 72 hours No results found for this basename: LIPASE:2,AMYLASE:2 in the last 72 hours  Basename 11/20/11 1000  WBC 12.7*  NEUTROABS 11.2*  HGB 13.1  HCT 39.7  MCV 85.2  PLT 226   No results found for this basename: CKTOTAL:3,CKMB:3,CKMBINDEX:3,TROPONINI:3 in the last 72 hours No components found with this basename: POCBNP:3 No results found for this basename: DDIMER:2 in the last 72 hours No results found for this basename: HGBA1C:2 in the last 72 hours No results found for this basename: CHOL:2,HDL:2,LDLCALC:2,TRIG:2,CHOLHDL:2,LDLDIRECT:2 in the last 72 hours No results found for this basename: TSH,T4TOTAL,FREET3,T3FREE,THYROIDAB in the last 72 hours No results found for this basename: VITAMINB12:2,FOLATE:2,FERRITIN:2,TIBC:2,IRON:2,RETICCTPCT:2 in the last 72 hours Imaging results:  Dg Chest 2 View  11/20/2011  *RADIOLOGY REPORT*  Clinical Data: Dizziness, cough, chest pain and shortness of breath.  CHEST - 2 VIEW  Comparison: Chest x-ray  06/21/2011.  Findings: Compared to the prior examination, there is a new area of airspace consolidation in the right upper lobe.  Lungs otherwise appear clear.  No definite pleural effusions.  Pulmonary vasculature and the cardiomediastinal silhouette are within normal limits.  IMPRESSION: 1.  New area of airspace consolidation in the right upper lobe. This is likely to represent a pneumonia.  Follow-up radiographs after appropriate antimicrobial therapy is recommended to confirm resolution of this finding (i.e., to exclude a central obstructing lesion).  Original Report Authenticated By: Florencia Reasons, M.D.   Other results: EKG: Pending.   Patient Active Hospital Problem List: DKA, type 2 (11/20/2011) Will admit her to telemetry floor, start her on IV insulin at a lower dose as she is in acute renal failure and his post  transplant. She still has good urine output. Her anion gap is 15, her bicarbonate is 18. We'll monitor CBGs every hour, basic metabolic panel every 4 hours. We'll start her on IV fluids normal saline as she seems to be dehydrated by physical exam was done 100 hour for 12 hours and at 75 cc an hour. Once her blood glucose reaches 02 50 we'll start her on D5. Her most likely cause of DKA as she is taking her insulin would be her pneumonia. We'll get blood cultures and sputum cultures and start her on broad-spectrum antibiotics. Vancomycin, Levaquin and cefepime for health care associated pneumonia.  HYPERTENSION (06/12/2008) Will hold her blood pressure medications, her blood pressure initially on admission was 99/53 she seems to be intravascularly depleted. We'll we'll start her on IV fluids and restart her blood pressure medication as he starts to trend up.  End stage renal disease () Her baseline creatinine is 1.0. She relates she has not been eating and drinking well. Has had polyuria. The most likely cause of her acute kidney injury is probably intravascular volume depletion.  We'll start her on IV fluids  and continue to monitor her creatinine closely.   Hyponatremia (09/22/2011)  there is a component of pseudohyponatremia secondary to her blood glucose actually her corrected sodium is 134. Will start her on IV fluids and recheck a basic metabolic panel in the morning. Her mucous membranes he is significantly dry, so this most likely secondary to dehydration.   Acute renal failure (ARF) (11/20/2011)  is most likely secondary to decreased by mouth intake and increasing her urine secondary to her osmotic diuresis of her blood glucose. Will start her on IV fluids and recheck a basic metabolic panel in the morning. At this time on that she needs renal ultrasound. As she does relate she has been having good urine output.  Healthcare associated PNA (pneumonia) (11/20/2011) She was discharged from the hospital on 09/22/2011. We'll treat her for health care associated pneumonia vancomycin, cefepime and Levaquin. We'll get blood cultures and sputum cultures. She does have a white count and infiltrate on chest x-ray, she also has shortness of breath and cough in the most likely cause for this is pneumonia.   Code Status: Full code Family Communication: Spouse [phone]   Marinda Elk M.D. [facility] [phone] 11/20/2011, 1:54 PM

## 2011-11-20 NOTE — Progress Notes (Signed)
ANTIBIOTIC CONSULT NOTE - INITIAL  Pharmacy Consult for Vancomycin, Cefepime, and Levaquin Indication: rule out pneumonia (HCAP)  Allergies  Allergen Reactions  . Ace Inhibitors Other (See Comments)    cough  . Adhesive (Tape) Itching    Patient Measurements: Height: 5\' 10"  (177.8 cm) Weight: 260 lb (117.935 kg) IBW/kg (Calculated) : 68.5   Vital Signs: Temp: 97.4 F (36.3 C) (03/15 1500) Temp src: Oral (03/15 1500) BP: 108/70 mmHg (03/15 1500) Pulse Rate: 81  (03/15 1500)  Labs:  Basename 11/20/11 1000  WBC 12.7*  HGB 13.1  PLT 226  LABCREA --  CREATININE 1.48*   Estimated Creatinine Clearance: 67.6 ml/min (by C-G formula based on Cr of 1.48).  No results found for this basename: VANCOTROUGH:2,VANCOPEAK:2,VANCORANDOM:2,GENTTROUGH:2,GENTPEAK:2,GENTRANDOM:2,TOBRATROUGH:2,TOBRAPEAK:2,TOBRARND:2,AMIKACINPEAK:2,AMIKACINTROU:2,AMIKACIN:2, in the last 72 hours   Microbiology: No results found for this or any previous visit (from the past 720 hour(s)).  Medical History: Past Medical History  Diagnosis Date  . Diabetes mellitus   . Hypertension   . Kidney disease   . Hypothyroid   . Depression   . End stage renal disease     right arm AV graft, post transplant  . 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    Medications:  Anti-infectives     Start     Dose/Rate Route Frequency Ordered Stop   11/20/11 1700   ceFEPIme (MAXIPIME) 2 g in dextrose 5 % 50 mL IVPB        2 g 100 mL/hr over 30 Minutes Intravenous Every 12 hours 11/20/11 1559 11/28/11 2159   11/20/11 1600   vancomycin (VANCOCIN) 1,500 mg in sodium chloride 0.9 % 500 mL IVPB        1,500 mg 250 mL/hr over 120 Minutes Intravenous Every 24 hours 11/20/11 1559 11/28/11 1559   11/20/11 1530   ceFEPIme (MAXIPIME) 1 g in dextrose 5 % 50 mL IVPB  Status:  Discontinued        1 g 100 mL/hr over 30 Minutes Intravenous 3 times per day 11/20/11 1529 11/20/11 1556   11/20/11 1530   Levofloxacin  (LEVAQUIN) IVPB 750 mg        750 mg 100 mL/hr over 90 Minutes Intravenous Every 24 hours 11/20/11 1529 11/28/11 1529          Assessment:  46 yo F admit with r/o pneumonia, history of renal transplant (12/2009) with chronic immunosuppression, DM, HTN  Antibiotic coverage for HCAP ordered for 8 days  Acute on chronic renal insufficiency;  SCr is elevated at 1.48, CrCl (n) ~ 65 ml/min  Goal of Therapy:  Vancomycin trough level 15-20 mcg/ml Antibiotic dosing per renal function  Plan:   Vancomycin 1500mg  IV Q 24 hours  Cefepime 2g IV Q 12 hours  Levaquin 750mg  IV Q 24 hours  Measure antibiotic drug levels at steady state  Follow up culture results, renal function, and labs as available.   Lynann Beaver PharmD, BCPS Pager [phone] 11/20/2011 3:53 PM

## 2011-11-20 NOTE — ED Provider Notes (Signed)
History     CSN: [id number]  Arrival date & time 11/20/11  0945   First MD Initiated Contact with Patient 11/20/11 1007      Chief Complaint  Patient presents with  . Dizziness    (Consider location/radiation/quality/duration/timing/severity/associated sxs/prior treatment) The history is provided by the patient.   46 year old female has had a nonproductive cough for the last week. For the last 2 days, she has had some nausea with occasional vomiting and also some mild diarrhea. She's noted she feels dizzy when she stands up. Her blood sugars have been high at home are running as high as 430. Appetite has been diminished and she's not been able to eat anything for the last 2 days. She denies fever, chills, sweats. She denies headache, chest pain, abdominal pain, arthralgias or myalgias. She states she has been taking her insulin you know she has not been eating. She is status post renal transplant. Symptoms are described as severe. Nothing makes her feel any better. Dizziness is worse when she stands.  Past Medical History  Diagnosis Date  . Diabetes mellitus   . Hypertension   . Kidney disease   . Hypothyroid   . Depression   . End stage renal disease     right arm AV graft, post transplant  . 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 Back surgery 2001  . Tubal ligation   . Dg av dialysis graft declot or   . Nephrectomy transplanted organ     Family History  Problem Relation Age of Onset  . Emphysema Mother   . Throat cancer Mother   . COPD Mother   . Emphysema Father   . COPD Father   . Stroke Father     History  Substance Use Topics  . Smoking status: Former Smoker -- 11 years    Types: Cigarettes    Quit date: 09/21/1998  . Smokeless tobacco: Never Used  . Alcohol Use: No    OB History    Grav Para Term Preterm Abortions TAB SAB Ect Mult Living                  Review of Systems  All other  systems reviewed and are negative.    Allergies  Ace inhibitors and Adhesive  Home Medications   Current Outpatient Rx  Name Route Sig Dispense Refill  . AMLODIPINE BESYLATE 5 MG PO TABS Oral Take 5 mg by mouth daily.      [name] CARVEDILOL 25 MG PO TABS Oral Take 50 mg by mouth 2 (two) times daily.     . INSULIN ASPART 100 UNIT/ML [place] SOLN Subcutaneous Inject 8 Units into the skin 3 (three) times daily before meals.     . INSULIN GLARGINE 100 UNIT/ML  SOLN  40 units in the morning and 30 units in the evening 100 mL 50  . LEVOTHYROXINE SODIUM 137 MCG PO TABS Oral Take 137 mcg by mouth daily. Name brand    . MYCOPHENOLATE MOFETIL 250 MG PO CAPS Oral Take 500 mg by mouth 2 (two) times daily.     [name] PREDNISONE 5 MG PO TABS Oral Take 5 mg by mouth daily.      . TYLENOL COLD/FLU SEVERE DAY PO Oral Take by mouth.      [name] RANITIDINE HCL 150 MG PO CAPS Oral Take 150 mg by mouth daily.     [name] TACROLIMUS 1 MG  PO CAPS Oral Take 1 mg by mouth 2 (two) times daily.       BP 102/85  Pulse 82  Temp(Src) 98.9 F (37.2 C) (Oral)  Resp 18  Ht 5\' 10"  (1.778 m)  Wt 260 lb (117.935 kg)  BMI 37.31 kg/m2  SpO2 98%  LMP 11/19/2011  Physical Exam  Nursing note and vitals reviewed.  46 year old female who appears cachectic but in no acute distress. Vital signs are significant for mild tachypnea with respiratory rate of 22. Oxygen saturation is 100% which is normal. Head is normocephalic and atraumatic. PERRLA, EOMI. Oropharynx is clear. Neck is nontender and supple. Back is nontender. Lungs are clear without rales, wheezes, rhonchi. Heart has regular rate and rhythm without murmur. Abdomen is soft, flat, nontender without masses or hepatosplenomegaly. Transplanted kidney is palpated in her right suprapubic area and nontender. Extremities have no cyanosis or edema. She has had amputation of the right first, second, and third toes. 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 Neurologic: Mental status is normal, cranial nerves are intact, there no focal motor or sensory deficits.  ED Course  Procedures (including critical care time)  Results for orders placed during the hospital encounter of 11/20/11  GLUCOSE, CAPILLARY      Component Value Range   Glucose-Capillary 540 (*) 70 - 99 (mg/dL)  CBC      Component Value Range   WBC 12.7 (*) 4.0 - 10.5 (K/uL)   RBC 4.66  3.87 - 5.11 (MIL/uL)   Hemoglobin 13.1  12.0 - 15.0 (g/dL)   HCT 16.1  09.6 - 04.5 (%)   MCV 85.2  78.0 - 100.0 (fL)   MCH 28.1  26.0 - 34.0 (pg)   MCHC 33.0  30.0 - 36.0 (g/dL)   RDW 40.9  81.1 - 91.4 (%)   Platelets 226  150 - 400 (K/uL)  DIFFERENTIAL      Component Value Range   Neutrophils Relative 88 (*) 43 - 77 (%)   Neutro Abs 11.2 (*) 1.7 - 7.7 (K/uL)   Lymphocytes Relative 4 (*) 12 - 46 (%)   Lymphs Abs 0.5 (*) 0.7 - 4.0 (K/uL)   Monocytes Relative 8  3 - 12 (%)   Monocytes Absolute 1.0  0.1 - 1.0 (K/uL)   Eosinophils Relative 0  0 - 5 (%)   Eosinophils Absolute 0.0  0.0 - 0.7 (K/uL)   Basophils Relative 0  0 - 1 (%)   Basophils Absolute 0.0  0.0 - 0.1 (K/uL)  BASIC METABOLIC PANEL      Component Value Range   Sodium 127 (*) 135 - 145 (mEq/L)   Potassium 4.9  3.5 - 5.1 (mEq/L)   Chloride 93 (*) 96 - 112 (mEq/L)   CO2 18 (*) 19 - 32 (mEq/L)   Glucose, Bld 571 (*) 70 - 99 (mg/dL)   BUN 54 (*) 6 - 23 (mg/dL)   Creatinine, Ser 7.82 (*) 0.50 - 1.10 (mg/dL)   Calcium 95.6 (*) 8.4 - 10.5 (mg/dL)   GFR calc non Af Amer 42 (*) >90 (mL/min)   GFR calc Af Amer 49 (*) >90 (mL/min)  URINALYSIS, ROUTINE W REFLEX MICROSCOPIC      Component Value Range   Color, Urine YELLOW  YELLOW    APPearance TURBID (*) CLEAR    Specific Gravity, Urine 1.026  1.005 - 1.030    pH 5.5  5.0 - 8.0  Glucose, UA >1000 (*) NEGATIVE (mg/dL)   Hgb urine dipstick LARGE (*) NEGATIVE    Bilirubin Urine MODERATE (*) NEGATIVE    Ketones, ur 15 (*) NEGATIVE (mg/dL)    Protein, ur NEGATIVE  NEGATIVE (mg/dL)   Urobilinogen, UA 0.2  0.0 - 1.0 (mg/dL)   Nitrite NEGATIVE  NEGATIVE    Leukocytes, UA SMALL (*) NEGATIVE   URINE MICROSCOPIC-ADD ON      Component Value Range   Squamous Epithelial / LPF FEW (*) RARE    WBC, UA 7-10  <3 (WBC/hpf)   RBC / HPF 0-2  <3 (RBC/hpf)   Bacteria, UA MANY (*) RARE    Dg Chest 2 View  11/20/2011  *RADIOLOGY REPORT*  Clinical Data: Dizziness, cough, chest pain and shortness of breath.  CHEST - 2 VIEW  Comparison: Chest x-ray 06/21/2011.  Findings: Compared to the prior examination, there is a new area of airspace consolidation in the right upper lobe.  Lungs otherwise appear clear.  No definite pleural effusions.  Pulmonary vasculature and the cardiomediastinal silhouette are within normal limits.  IMPRESSION: 1.  New area of airspace consolidation in the right upper lobe. This is likely to represent a pneumonia.  Follow-up radiographs after appropriate antimicrobial therapy is recommended to confirm resolution of this finding (i.e., to exclude a central obstructing lesion).  Original Report Authenticated By: Florencia Reasons, M.D.    Laboratory workup shows early diabetic ketoacidosis. She is started on an insulin drip. Case is discussed with Dr. Robb Matar of [facility] who agrees to admit the patient.  1. Diabetic ketoacidosis    CRITICAL CARE Performed by: [email or web address]   Total critical care time: 35 minutes  Critical care time was exclusive of separately billable procedures and treating other patients.  Critical care was necessary to treat or prevent imminent or life-threatening deterioration.  Critical care was time spent personally by me on the following activities: development of treatment plan with patient and/or surrogate as well as nursing, discussions with consultants, evaluation of patient's response to treatment, examination of patient, obtaining history from patient or surrogate, ordering and performing  treatments and interventions, ordering and review of laboratory studies, ordering and review of radiographic studies, pulse oximetry and re-evaluation of patient's condition.    MDM  Hyperglycemia which is probably related to her infection which is causing a cough. Cough seems most likely to be a bronchitis. She'll need to be evaluated him with electrolytes to see if she is in mild diabetic ketoacidosis. She'll be given a fluid bolus and then insulin as needed to reduce her blood sugar. Chest x-ray has been ordered as well to rule out pneumonia. Prior records have been reviewed and she had been admitted in January of this year for diabetic ketoacidosis.        Dione Booze, MD 11/20/11 1321

## 2011-11-20 NOTE — ED Notes (Signed)
Rt called for breathing treatment.

## 2011-11-20 NOTE — ED Notes (Signed)
md alerted of critical value 

## 2011-11-20 NOTE — ED Notes (Signed)
cbg taken in triage 540

## 2011-11-20 NOTE — ED Notes (Signed)
Report given to [name], rn on floor 

## 2011-11-20 NOTE — Progress Notes (Signed)
   CARE MANAGEMENT NOTE 11/20/2011  Patient:  [name], [name]   Account Number:  [id number]  Date Initiated:  11/20/2011  Documentation initiated by:  Lanier Clam  Subjective/Objective Assessment:   ADMITTED W/DIZZINESS. HX:DM,HTN,RECENT RENAL TRANSPLANT.     Action/Plan:   FORM HOME W/SPOUSE.   Anticipated DC Date:  11/24/2011   Anticipated DC Plan:  HOME/SELF CARE         Choice offered to / List presented to:             Status of service:  In process, will continue to follow Medicare Important Message given?   (If response is "NO", the following Medicare IM given date fields will be blank) Date Medicare IM given:   Date Additional Medicare IM given:    Discharge Disposition:    Per UR Regulation:  Reviewed for med. necessity/level of care/duration of stay  If discussed at Long Length of Stay Meetings, dates discussed:    Comments:  11/20/11 [facility] RN,BSN [phone]

## 2011-11-20 NOTE — ED Notes (Signed)
Pt states she has been having some dizziness and decreased appetite. Pt states when she ambulates she becomes dizzy.

## 2011-11-20 NOTE — ED Notes (Signed)
md alerted pt in pain

## 2011-11-20 NOTE — ED Notes (Signed)
md at bedside

## 2011-11-21 LAB — STREP PNEUMONIAE URINARY ANTIGEN: Strep Pneumo Urinary Antigen: NEGATIVE

## 2011-11-21 LAB — GLUCOSE, CAPILLARY
Glucose-Capillary: 138 mg/dL — ABNORMAL HIGH (ref 70–99)
Glucose-Capillary: 140 mg/dL — ABNORMAL HIGH (ref 70–99)
Glucose-Capillary: 207 mg/dL — ABNORMAL HIGH (ref 70–99)
Glucose-Capillary: 213 mg/dL — ABNORMAL HIGH (ref 70–99)
Glucose-Capillary: 248 mg/dL — ABNORMAL HIGH (ref 70–99)

## 2011-11-21 LAB — BASIC METABOLIC PANEL
BUN: 30 mg/dL — ABNORMAL HIGH (ref 6–23)
CO2: 22 mEq/L (ref 19–32)
Calcium: 10 mg/dL (ref 8.4–10.5)
Chloride: 100 mEq/L (ref 96–112)
Creatinine, Ser: 1 mg/dL (ref 0.50–1.10)
GFR calc Af Amer: 78 mL/min — ABNORMAL LOW (ref >90)
GFR calc Af Amer: 72 mL/min — ABNORMAL LOW (ref >90)
GFR calc non Af Amer: 59 mL/min — ABNORMAL LOW (ref >90)
GFR calc non Af Amer: 67 mL/min — ABNORMAL LOW (ref >90)
Glucose, Bld: 208 mg/dL — ABNORMAL HIGH (ref 70–99)
Glucose, Bld: 237 mg/dL — ABNORMAL HIGH (ref 70–99)
Potassium: 4 mEq/L (ref 3.5–5.1)
Potassium: 4.1 mEq/L (ref 3.5–5.1)
Potassium: 4.2 mEq/L (ref 3.5–5.1)
Sodium: 132 mEq/L — ABNORMAL LOW (ref 135–145)
Sodium: 133 mEq/L — ABNORMAL LOW (ref 135–145)
Sodium: 136 mEq/L (ref 135–145)

## 2011-11-21 MED ORDER — INSULIN GLARGINE 100 UNIT/ML ~~LOC~~ SOLN: 30.0000 [IU] | Freq: Once | SUBCUTANEOUS | Status: DC

## 2011-11-21 MED ORDER — INSULIN GLARGINE 100 UNIT/ML ~~LOC~~ SOLN: 40.0000 [IU] | Freq: Every day | SUBCUTANEOUS | Status: DC

## 2011-11-21 MED ORDER — INSULIN GLARGINE 100 UNIT/ML ~~LOC~~ SOLN
20.0000 [IU] | Freq: Once | SUBCUTANEOUS | Status: AC
  Administered 2011-11-21: 20 [IU] via SUBCUTANEOUS

## 2011-11-21 MED ORDER — AMLODIPINE BESYLATE 5 MG PO TABS
5.0000 mg | ORAL_TABLET | Freq: Every day | ORAL | Status: DC
  Administered 2011-11-21 – 2011-11-22 (×2): 5 mg via ORAL
  Filled 2011-11-21 (×2): qty 1

## 2011-11-21 MED ORDER — CARVEDILOL 25 MG PO TABS
50.0000 mg | ORAL_TABLET | Freq: Two times a day (BID) | ORAL | Status: DC
  Administered 2011-11-21 – 2011-11-22 (×3): 50 mg via ORAL
  Filled 2011-11-21 (×4): qty 2

## 2011-11-21 MED ORDER — INSULIN ASPART 100 UNIT/ML ~~LOC~~ SOLN
0.0000 [IU] | Freq: Three times a day (TID) | SUBCUTANEOUS | Status: DC
  Administered 2011-11-21 (×3): 7 [IU] via SUBCUTANEOUS

## 2011-11-21 MED ORDER — INSULIN GLARGINE 100 UNIT/ML ~~LOC~~ SOLN: 10.0000 [IU] | Freq: Every day | SUBCUTANEOUS | Status: DC

## 2011-11-21 MED ORDER — INSULIN ASPART 100 UNIT/ML ~~LOC~~ SOLN
0.0000 [IU] | Freq: Every day | SUBCUTANEOUS | Status: DC
  Administered 2011-11-21: 2 [IU] via SUBCUTANEOUS

## 2011-11-21 MED ORDER — INSULIN GLARGINE 100 UNIT/ML ~~LOC~~ SOLN
10.0000 [IU] | Freq: Every day | SUBCUTANEOUS | Status: DC
Start: 2011-11-21 — End: 2011-11-21
  Administered 2011-11-21: 10 [IU] via SUBCUTANEOUS

## 2011-11-21 NOTE — Progress Notes (Signed)
Subjective: No complains.  Objective: Filed Vitals:   11/20/11 1300 11/20/11 1500 11/20/11 2115 11/21/11 0608  BP:  108/70 120/74 133/79  Pulse: 88 81  85  Temp:  97.4 F (36.3 C) 98.3 F (36.8 C) 97.8 F (36.6 C)  TempSrc:  Oral Oral Oral  Resp: 18 16 20 18   Height:      Weight:      SpO2: 96% 98% 96% 96%   Weight change:   Intake/Output Summary (Last 24 hours) at 11/21/11 0955 Last data filed at 11/21/11 0947  Gross per 24 hour  Intake 1860.64 ml  Output   2150 ml  Net -289.36 ml    General: Alert, awake, oriented x3, in no acute distress.  HEENT: No bruits, no goiter.  Heart: Regular rate and rhythm, without murmurs, rubs, gallops.  Lungs: Crackles left side, bilateral air movement.  Abdomen: Soft, nontender, nondistended, positive bowel sounds.  Neuro: Grossly intact, nonfocal.   Lab Results:  Basename 11/21/11 0810 11/21/11 0519 11/21/11 0020 11/20/11 2042 11/20/11 1705  NA 133* 135 136 136 133*  K 4.1 4.0 4.5 4.1 4.6  CL 102 106 108 106 102  CO2 22 22 19 20 20   GLUCOSE 237* 138* 208* 148* 327*  BUN 28* 30* 37* 42* 46*  CREATININE 1.04 1.00 1.12* 1.19* 1.23*  CALCIUM 10.4 10.2 10.0 10.5 10.3  MG -- -- -- -- --  PHOS -- -- -- -- --   No results found for this basename: AST:2,ALT:2,ALKPHOS:2,BILITOT:2,PROT:2,ALBUMIN:2 in the last 72 hours No results found for this basename: LIPASE:2,AMYLASE:2 in the last 72 hours  Basename 11/20/11 1705 11/20/11 1000  WBC 11.0* 12.7*  NEUTROABS -- 11.2*  HGB 12.0 13.1  HCT 37.4 39.7  MCV 85.0 85.2  PLT 237 226   No results found for this basename: CKTOTAL:3,CKMB:3,CKMBINDEX:3,TROPONINI:3 in the last 72 hours No components found with this basename: POCBNP:3 No results found for this basename: DDIMER:2 in the last 72 hours No results found for this basename: HGBA1C:2 in the last 72 hours No results found for this basename: CHOL:2,HDL:2,LDLCALC:2,TRIG:2,CHOLHDL:2,LDLDIRECT:2 in the last 72 hours No results found for  this basename: TSH,T4TOTAL,FREET3,T3FREE,THYROIDAB in the last 72 hours No results found for this basename: VITAMINB12:2,FOLATE:2,FERRITIN:2,TIBC:2,IRON:2,RETICCTPCT:2 in the last 72 hours  Micro Results: Recent Results (from the past 240 hour(s))  CULTURE, BLOOD (ROUTINE X 2)     Status: Normal (Preliminary result)   Collection Time   11/20/11  4:55 PM      Component Value Range Status Comment   Specimen Description BLOOD RIGHT ARM  4 ML IN [facility] BOTTLE   Final    Special Requests Immunocompromised   Final    Culture  Setup Time [id number]   Final    Culture     Final    Value:        BLOOD CULTURE RECEIVED NO GROWTH TO DATE CULTURE WILL BE HELD FOR 5 DAYS BEFORE ISSUING A FINAL NEGATIVE REPORT   Report Status PENDING   Incomplete   CULTURE, BLOOD (ROUTINE X 2)     Status: Normal (Preliminary result)   Collection Time   11/20/11  5:00 PM      Component Value Range Status Comment   Specimen Description BLOOD RIGHT ARM  5 ML IN [facility] BOTTLE   Final    Special Requests Immunocompromised   Final    Culture  Setup Time [id number]   Final    Culture     Final    Value:  BLOOD CULTURE RECEIVED NO GROWTH TO DATE CULTURE WILL BE HELD FOR 5 DAYS BEFORE ISSUING A FINAL NEGATIVE REPORT   Report Status PENDING   Incomplete     Studies/Results: Dg Chest 2 View  11/20/2011  *RADIOLOGY REPORT*  Clinical Data: Dizziness, cough, chest pain and shortness of breath.  CHEST - 2 VIEW  Comparison: Chest x-ray 06/21/2011.  Findings: Compared to the prior examination, there is a new area of airspace consolidation in the right upper lobe.  Lungs otherwise appear clear.  No definite pleural effusions.  Pulmonary vasculature and the cardiomediastinal silhouette are within normal limits.  IMPRESSION: 1.  New area of airspace consolidation in the right upper lobe. This is likely to represent a pneumonia.  Follow-up radiographs after appropriate antimicrobial therapy is recommended to confirm resolution of this  finding (i.e., to exclude a central obstructing lesion).  Original Report Authenticated By: Florencia Reasons, M.D.    Medications: I have reviewed the patient's current medications.  Assessment and plan: Principal Problem:  *DKA, type 2: Resolved. Increase Lantus. Continue to monitor CBG's.   HYPERTENSION Resume home meds.   End stage renal disease/ Acute renal failure (ARF): Resolved. Creatinine at baseline.   Hyponatremia Resolved. With IV fluids.   PNA (pneumonia) ON day 1 of Levaquin, Vanc, and cefepime. Afebrile WBC down deescalate treatment to Levaquin.   Hypercalcemia Resolved.       LOS: 1 day   Marinda Elk M.D. Pager: [phone] [facility] 11/21/2011, 9:55 AM

## 2011-11-22 LAB — GLUCOSE, CAPILLARY: Glucose-Capillary: 382 mg/dL — ABNORMAL HIGH (ref 70–99)

## 2011-11-22 LAB — CBC
HCT: 37.4 % (ref 36.0–46.0)
Hemoglobin: 12.4 g/dL (ref 12.0–15.0)
MCV: 84.6 fL (ref 78.0–100.0)
RBC: 4.42 MIL/uL (ref 3.87–5.11)
WBC: 7.9 10*3/uL (ref 4.0–10.5)

## 2011-11-22 LAB — LEGIONELLA ANTIGEN, URINE

## 2011-11-22 MED ORDER — GUAIFENESIN-CODEINE 100-10 MG/5ML PO SOLN: 5.0000 mL | ORAL | Status: DC | PRN

## 2011-11-22 MED ORDER — LEVOFLOXACIN 750 MG PO TABS
750.0000 mg | ORAL_TABLET | Freq: Every day | ORAL | Status: DC
  Administered 2011-11-22: 750 mg via ORAL
  Filled 2011-11-22: qty 1

## 2011-11-22 MED ORDER — LEVOFLOXACIN 750 MG PO TABS: 750.0000 mg | ORAL_TABLET | Freq: Every day | ORAL | Status: AC

## 2011-11-22 NOTE — Discharge Summary (Signed)
Admit date: 11/20/2011 Discharge date: 11/22/2011  Primary Care Physician:  [name], MD, MD   Discharge Diagnoses:   Active Hospital Problems  Diagnoses Date Noted   . Healthcare-associated pneumonia 11/20/2011   . Hyponatremia 09/22/2011   . End stage renal disease    . Hypothyroid    . HYPERTENSION 06/12/2008     Resolved Hospital Problems  Diagnoses Date Noted Date Resolved  . DKA, type 2 11/20/2011 11/22/2011  . Acute renal failure (ARF) 11/20/2011 11/22/2011  . Hypercalcemia 11/20/2011 11/22/2011     DISCHARGE MEDICATION: Medication List  As of 11/22/2011  7:34 AM   TAKE these medications         amLODipine 5 MG tablet   Commonly known as: NORVASC   Take 5 mg by mouth daily.      carvedilol 25 MG tablet   Commonly known as: COREG   Take 50 mg by mouth 2 (two) times daily.      guaiFENesin-codeine 100-10 MG/5ML syrup   Take 5 mLs by mouth every 4 (four) hours as needed for cough.      insulin aspart 100 UNIT/ML injection   Commonly known as: novoLOG   Inject 8-14 Units into the skin 3 (three) times daily before meals. Sliding scale insulin; 8 units base, then add 2 units for each 100 score on CBG over 100. (200-299 = 10u; 300-399 = 12u; etc)      insulin glargine 100 UNIT/ML injection   Commonly known as: LANTUS   40 units in the morning and 30 units in the evening      levofloxacin 750 MG tablet   Commonly known as: LEVAQUIN   Take 1 tablet (750 mg total) by mouth daily.      levothyroxine 137 MCG tablet   Commonly known as: SYNTHROID, LEVOTHROID   Take 137 mcg by mouth daily. Name brand      mycophenolate 250 MG capsule   Commonly known as: CELLCEPT   Take 500 mg by mouth 2 (two) times daily.      predniSONE 5 MG tablet   Commonly known as: DELTASONE   Take 5 mg by mouth daily.      ranitidine 150 MG capsule   Commonly known as: ZANTAC   Take 150 mg by mouth daily.      tacrolimus 1 MG capsule   Commonly known as: PROGRAF   Take 1 mg  by mouth 2 (two) times daily.      TYLENOL COLD/FLU SEVERE DAY PO   Take 1 capsule by mouth every 6 (six) hours as needed. For cold symptoms.              Consults:     SIGNIFICANT DIAGNOSTIC STUDIES:  Dg Chest 2 View  11/20/2011  *RADIOLOGY REPORT*  Clinical Data: Dizziness, cough, chest pain and shortness of breath.  CHEST - 2 VIEW  Comparison: Chest x-ray 06/21/2011.  Findings: Compared to the prior examination, there is a new area of airspace consolidation in the right upper lobe.  Lungs otherwise appear clear.  No definite pleural effusions.  Pulmonary vasculature and the cardiomediastinal silhouette are within normal limits.  IMPRESSION: 1.  New area of airspace consolidation in the right upper lobe. This is likely to represent a pneumonia.  Follow-up radiographs after appropriate antimicrobial therapy is recommended to confirm resolution of this finding (i.e., to exclude a central obstructing lesion).  Original Report Authenticated By: Florencia Reasons, M.D.     Recent Results (from the  past 240 hour(s))  CULTURE, BLOOD (ROUTINE X 2)     Status: Normal (Preliminary result)   Collection Time   11/20/11  4:55 PM      Component Value Range Status Comment   Specimen Description BLOOD RIGHT ARM  4 ML IN [facility] BOTTLE   Final    Special Requests Immunocompromised   Final    Culture  Setup Time [id number]   Final    Culture     Final    Value:        BLOOD CULTURE RECEIVED NO GROWTH TO DATE CULTURE WILL BE HELD FOR 5 DAYS BEFORE ISSUING A FINAL NEGATIVE REPORT   Report Status PENDING   Incomplete   CULTURE, BLOOD (ROUTINE X 2)     Status: Normal (Preliminary result)   Collection Time   11/20/11  5:00 PM      Component Value Range Status Comment   Specimen Description BLOOD RIGHT ARM  5 ML IN [facility] BOTTLE   Final    Special Requests Immunocompromised   Final    Culture  Setup Time [id number]   Final    Culture     Final    Value:        BLOOD CULTURE RECEIVED NO GROWTH TO DATE  CULTURE WILL BE HELD FOR 5 DAYS BEFORE ISSUING A FINAL NEGATIVE REPORT   Report Status PENDING   Incomplete     BRIEF ADMITTING H & P:  46 year old female with past medical history of renal transplant done in April 2011, diabetes type 2 with a last hemoglobin A1c of 10.4 back in January 2013, hypertension comes in with dizziness cough. She relates that 3 days prior to admission she started getting cough which progressively gotten worst. She relates that there have been sick contacts around her with cough and fevers. She denies any fevers. Then 2 days prior to admission she started getting short of breath to the point where she can walk 100 feet without being short of breath. She relates she has lost her appetite she has not been able to eat. She relates no fevers, but some nausea. She has been taking her insulin regularly, both from Lantus and short-acting insulin. But despite that her blood glucose kept increasing. This morning when she woke up she started getting dizzy so she decided to come to the ED.  Here in the ED a chest x-ray was done that was suspicious for pneumonia, she also has a white count mild anion gap hyponatremic and acute renal failure and a blood glucose of 571.  Active Hospital Problems  Diagnoses Date Noted   .  healthcare PNA (pneumonia): Admitted to MedSurg unit her white count was high she was having fevers, she was started on empiric treatment with vancomycin, cefepime and Levaquin.  Her white blood cell count started to trend down she defervesced her Levaquin was changed to IV she was monitored for 24 hours her white count resolved and she had no further fevers after the escalating her antibiotic treatment. She will go home on 6 more days of antibiotics. And will follow up with her primary care Dr. as an outpatient.  11/20/2011   . Hyponatremia: There was a component of pseudohyponatremia after correction of her sodium her glucose it was 133 this is probably prerenal, she was  given IV fluids and does resolve.  09/22/2011   . End stage renal disease: She had acute renal failure on admission it was 1.2, from a baseline of 0.9. This  is probably secondary to decreased intravascular volume she was given IV fluids and this resolve.     . Hypothyroid: No changes were made.     [name] HYPERTENSION: No changes were made  06/12/2008     Resolved Hospital Problems  Diagnoses Date Noted Date Resolved  . DKA, type 2: She related she was taking insulin despite not eating. This episode of DKA was probably secondary to healthcare associated pneumonia she was started on an insulin drip her bicarbonate was 18 her anion gap was 15 by that afternoon her gap has resolved, her anion gap is less than 12. So she was changed to long-acting insulin. Her blood glucose continued to be controlled her repeated B. mets showed no opening of her anion gap. So she was changed her home dose and was discharged in stable condition.  11/20/2011 11/22/2011  . Acute renal failure (ARF): Probably secondary to decreased intravascular volume does resolve with IV fluids.  11/20/2011 11/22/2011  . Hypercalcemia, Is probably secondary to decreased intervascular volume does resolve with IV fluids.  11/20/2011 11/22/2011     Disposition and Follow-up:  Discharge Orders    Future Orders Please Complete By Expires   Diet - low sodium heart healthy      Increase activity slowly        Follow-up Information    Follow up with COLADONATO,JOSEPH A, MD in 1 week.          DISCHARGE EXAM:  General: Alert, awake, oriented x3, in no acute distress.  HEENT: No bruits, no goiter.  Heart: Regular rate and rhythm, without murmurs, rubs, gallops.  Lungs: Crackles left side, bilateral air movement.  Abdomen: Soft, nontender, nondistended, positive bowel sounds.  Neuro: Grossly intact, nonfocal.   Blood pressure 160/86, pulse 84, temperature 98.1 F (36.7 C), temperature source Oral, resp. rate 20, height 5\' 10"   (1.778 m), weight 117.935 kg (260 lb), last menstrual period 11/19/2011, SpO2 94.00%.   Basename 11/21/11 1140 11/21/11 0810  NA 132* 133*  K 4.2 4.1  CL 100 102  CO2 22 22  GLUCOSE 292* 237*  BUN 26* 28*  CREATININE 1.07 1.04  CALCIUM 10.2 10.4  MG -- --  PHOS -- --   No results found for this basename: AST:2,ALT:2,ALKPHOS:2,BILITOT:2,PROT:2,ALBUMIN:2 in the last 72 hours No results found for this basename: LIPASE:2,AMYLASE:2 in the last 72 hours  Basename 11/22/11 0644 11/20/11 1705 11/20/11 1000  WBC 7.9 11.0* --  NEUTROABS -- -- 11.2*  HGB 12.4 12.0 --  HCT 37.4 37.4 --  MCV 84.6 85.0 --  PLT 184 237 --    Signed: Marinda Elk M.D. 11/22/2011, 7:34 AM

## 2011-11-26 LAB — CULTURE, BLOOD (ROUTINE X 2)
Culture  Setup Time: 201303152305
Culture: NO GROWTH
Culture: NO GROWTH

## 2011-12-02 ENCOUNTER — Encounter (HOSPITAL_COMMUNITY): Payer: Self-pay | Admitting: *Deleted

## 2011-12-02 ENCOUNTER — Emergency Department (HOSPITAL_COMMUNITY)
Admission: EM | Admit: 2011-12-02 | Discharge: 2011-12-02 | Disposition: A | Discharge status: Home / Self Care | Payer: Medicare Other | Attending: Emergency Medicine | Admitting: Emergency Medicine

## 2011-12-02 DIAGNOSIS — Z79899 Other long term (current) drug therapy: Secondary | ICD-10-CM | POA: Insufficient documentation

## 2011-12-02 DIAGNOSIS — I120 Hypertensive chronic kidney disease with stage 5 chronic kidney disease or end stage renal disease: Secondary | ICD-10-CM | POA: Insufficient documentation

## 2011-12-02 DIAGNOSIS — M549 Dorsalgia, unspecified: Secondary | ICD-10-CM

## 2011-12-02 DIAGNOSIS — N186 End stage renal disease: Secondary | ICD-10-CM | POA: Insufficient documentation

## 2011-12-02 DIAGNOSIS — E039 Hypothyroidism, unspecified: Secondary | ICD-10-CM | POA: Insufficient documentation

## 2011-12-02 DIAGNOSIS — Z905 Acquired absence of kidney: Secondary | ICD-10-CM | POA: Insufficient documentation

## 2011-12-02 DIAGNOSIS — F329 Major depressive disorder, single episode, unspecified: Secondary | ICD-10-CM | POA: Insufficient documentation

## 2011-12-02 DIAGNOSIS — Z794 Long term (current) use of insulin: Secondary | ICD-10-CM | POA: Insufficient documentation

## 2011-12-02 DIAGNOSIS — M545 Low back pain, unspecified: Secondary | ICD-10-CM | POA: Insufficient documentation

## 2011-12-02 DIAGNOSIS — E119 Type 2 diabetes mellitus without complications: Secondary | ICD-10-CM | POA: Insufficient documentation

## 2011-12-02 DIAGNOSIS — Z87891 Personal history of nicotine dependence: Secondary | ICD-10-CM | POA: Insufficient documentation

## 2011-12-02 DIAGNOSIS — F3289 Other specified depressive episodes: Secondary | ICD-10-CM | POA: Insufficient documentation

## 2011-12-02 MED ORDER — OXYCODONE-ACETAMINOPHEN 5-325 MG PO TABS: 2.0000 | ORAL_TABLET | ORAL | Status: AC | PRN

## 2011-12-02 MED ORDER — MORPHINE SULFATE 4 MG/ML IJ SOLN
4.0000 mg | Freq: Once | INTRAMUSCULAR | Status: AC
  Administered 2011-12-02: 4 mg via INTRAMUSCULAR
  Filled 2011-12-02: qty 1

## 2011-12-02 NOTE — ED Provider Notes (Signed)
History     CSN: [id number]  Arrival date & time 12/02/11  1101   First MD Initiated Contact with Patient 12/02/11 1128      Chief Complaint  Patient presents with  . Back Pain    (Consider location/radiation/quality/duration/timing/severity/associated sxs/prior treatment) Patient is a 46 y.o. female presenting with back pain. The history is provided by the patient and medical records.  Back Pain  Pertinent negatives include no fever, no numbness, no headaches, no abdominal pain and no dysuria.   the patient is a 46 year old, female, who complains of lower back pain.  Since she was leaning over 3 days ago.  The pain has persisted, so she presented to the emergency department for evaluation.  She denies fall or trauma.  She denies weakness in her lower extremities.  She has not had bowel or bladder incontinence and she denies urinary tract symptoms, or hematuria.  Past Medical History  Diagnosis Date  . Diabetes mellitus   . Hypertension   . Kidney disease   . Hypothyroid   . Depression   . End stage renal disease     right arm AV graft, post transplant  . 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 Back surgery 2001  . Tubal ligation   . Dg av dialysis graft declot or   . Nephrectomy transplanted organ     Family History  Problem Relation Age of Onset  . Emphysema Mother   . Throat cancer Mother   . COPD Mother   . Cancer Mother   . Emphysema Father   . COPD Father   . Stroke Father     History  Substance Use Topics  . Smoking status: Former Smoker -- 1.0 packs/day for 11 years    Types: Cigarettes    Quit date: 09/21/1998  . Smokeless tobacco: Never Used  . Alcohol Use: No    OB History    Grav Para Term Preterm Abortions TAB SAB Ect Mult Living                  Review of Systems  Constitutional: Negative for fever.  HENT: Negative for neck pain.   Gastrointestinal: Negative for nausea, vomiting  and abdominal pain.  Genitourinary: Negative for dysuria, urgency and hematuria.  Musculoskeletal: Positive for back pain. Negative for gait problem.  Neurological: Negative for numbness and headaches.  All other systems reviewed and are negative.    Allergies  Ace inhibitors and Adhesive  Home Medications   Current Outpatient Rx  Name Route Sig Dispense Refill  . AMLODIPINE BESYLATE 5 MG PO TABS Oral Take 5 mg by mouth daily.      [name] CARVEDILOL 25 MG PO TABS Oral Take 50 mg by mouth 2 (two) times daily.     . GUAIFENESIN-CODEINE 100-10 MG/5ML PO SOLN Oral Take 5 mLs by mouth every 4 (four) hours as needed for cough. 120 mL 0  . INSULIN ASPART 100 UNIT/ML [place] SOLN Subcutaneous Inject 8-14 Units into the skin 3 (three) times daily before meals. Sliding scale insulin; 8 units base, then add 2 units for each 100 score on CBG over 100. (200-299 = 10u; 300-399 = 12u; etc)    . INSULIN GLARGINE 100 UNIT/ML [place] SOLN  40 units in the morning and 30 units in the evening 100 mL 50  . LEVOFLOXACIN 750 MG PO TABS Oral Take 1 tablet (750 mg total) by mouth daily.  6 tablet 0  . LEVOTHYROXINE SODIUM 137 MCG PO TABS Oral Take 137 mcg by mouth daily. Name brand    . MYCOPHENOLATE MOFETIL 250 MG PO CAPS Oral Take 500 mg by mouth 2 (two) times daily.     [name] PREDNISONE 5 MG PO TABS Oral Take 5 mg by mouth daily.      Ronney Asters COLD/FLU SEVERE DAY PO Oral Take 1 capsule by mouth every 6 (six) hours as needed. For cold symptoms.    [name] RANITIDINE HCL 150 MG PO CAPS Oral Take 150 mg by mouth daily.     [name] TACROLIMUS 1 MG PO CAPS Oral Take 1 mg by mouth 2 (two) times daily.       BP 143/68  Pulse 81  Temp(Src) 98 F (36.7 C) (Oral)  SpO2 95%  LMP 11/19/2011  Physical Exam  Vitals reviewed. Constitutional: She is oriented to person, place, and time. She appears well-developed and well-nourished. No distress.  HENT:  Head: Normocephalic and atraumatic.  Eyes: Conjunctivae are normal.  Neck: Normal range of  motion.  Musculoskeletal: Normal range of motion.       Lumbar spine tenderness  Neurological: She is alert and oriented to person, place, and time. No cranial nerve deficit.  Skin: Skin is warm and dry.  Psychiatric: She has a normal mood and affect. Thought content normal.    ED Course  Procedures (including critical care time) 46 year old, female, with nontraumatic low back pain.  No weakness or paresthesias.  No bowel or bladder incontinence and no systemic symptoms.  No tests are indicated  Labs Reviewed - No data to display No results found.   No diagnosis found.    MDM  Back pain No signs of systemic illness or neurological deficit        Cheri Guppy, MD 12/02/11 1137

## 2011-12-02 NOTE — Discharge Instructions (Signed)
Use ibuprofen 600 mg every 6 hours for pain.  Use Percocet for more severe pain.  Follow up with your Dr. as needed.  Return for worse or uncontrolled symptoms

## 2011-12-02 NOTE — ED Notes (Signed)
Pt reports pain to lower back began Sunday night, unknown injury. Pt reports had back surgery over 10 years ago. Pain aching, sharp.

## 2012-02-05 ENCOUNTER — Encounter (HOSPITAL_COMMUNITY): Payer: Self-pay | Admitting: Emergency Medicine

## 2012-02-05 ENCOUNTER — Inpatient Hospital Stay (HOSPITAL_COMMUNITY)
Admission: EM | Admit: 2012-02-05 | Discharge: 2012-02-07 | DRG: 638 | Disposition: A | Discharge status: Home / Self Care | Payer: Medicare Other | Attending: Family Medicine | Admitting: Family Medicine

## 2012-02-05 DIAGNOSIS — Z940 Kidney transplant status: Secondary | ICD-10-CM

## 2012-02-05 DIAGNOSIS — E039 Hypothyroidism, unspecified: Secondary | ICD-10-CM | POA: Diagnosis present

## 2012-02-05 DIAGNOSIS — E1010 Type 1 diabetes mellitus with ketoacidosis without coma: Secondary | ICD-10-CM

## 2012-02-05 DIAGNOSIS — E871 Hypo-osmolality and hyponatremia: Secondary | ICD-10-CM | POA: Diagnosis present

## 2012-02-05 DIAGNOSIS — I10 Essential (primary) hypertension: Secondary | ICD-10-CM

## 2012-02-05 DIAGNOSIS — E1065 Type 1 diabetes mellitus with hyperglycemia: Secondary | ICD-10-CM | POA: Diagnosis present

## 2012-02-05 DIAGNOSIS — E875 Hyperkalemia: Secondary | ICD-10-CM | POA: Diagnosis present

## 2012-02-05 LAB — CBC
HCT: 44.6 % (ref 36.0–46.0)
Hemoglobin: 14.2 g/dL (ref 12.0–15.0)
Hemoglobin: 12.8 g/dL (ref 12.0–15.0)
MCH: 27.4 pg (ref 26.0–34.0)
MCHC: 31.9 g/dL (ref 30.0–36.0)
MCV: 85.7 fL (ref 78.0–100.0)
RBC: 4.68 MIL/uL (ref 3.87–5.11)
WBC: 7.7 10*3/uL (ref 4.0–10.5)

## 2012-02-05 LAB — COMPREHENSIVE METABOLIC PANEL
ALT: 12 U/L (ref 0–35)
Alkaline Phosphatase: 140 U/L — ABNORMAL HIGH (ref 39–117)
BUN: 28 mg/dL — ABNORMAL HIGH (ref 6–23)
CO2: 17 mEq/L — ABNORMAL LOW (ref 19–32)
Chloride: 89 mEq/L — ABNORMAL LOW (ref 96–112)
GFR calc Af Amer: 62 mL/min — ABNORMAL LOW (ref >90)
GFR calc non Af Amer: 54 mL/min — ABNORMAL LOW (ref >90)
Glucose, Bld: 706 mg/dL (ref 70–99)
Potassium: 5.2 mEq/L — ABNORMAL HIGH (ref 3.5–5.1)
Sodium: 127 mEq/L — ABNORMAL LOW (ref 135–145)
Total Bilirubin: 0.4 mg/dL (ref 0.3–1.2)

## 2012-02-05 LAB — DIFFERENTIAL
Basophils Absolute: 0 10*3/uL (ref 0.0–0.1)
Basophils Relative: 0 % (ref 0–1)
Lymphocytes Relative: 14 % (ref 12–46)
Monocytes Absolute: 0.5 10*3/uL (ref 0.1–1.0)
Monocytes Relative: 7 % (ref 3–12)
Neutro Abs: 6.1 10*3/uL (ref 1.7–7.7)
Neutrophils Relative %: 79 % — ABNORMAL HIGH (ref 43–77)

## 2012-02-05 LAB — BLOOD GAS, VENOUS
Acid-base deficit: 15.2 mmol/L — ABNORMAL HIGH (ref 0.0–2.0)
FIO2: 0.21 %
O2 Saturation: 80.3 %
Patient temperature: 98.6

## 2012-02-05 LAB — URINALYSIS, ROUTINE W REFLEX MICROSCOPIC
Bilirubin Urine: NEGATIVE
Hgb urine dipstick: NEGATIVE
Nitrite: NEGATIVE
Specific Gravity, Urine: 1.035 — ABNORMAL HIGH (ref 1.005–1.030)
Urobilinogen, UA: 0.2 mg/dL (ref 0.0–1.0)
pH: 5.5 (ref 5.0–8.0)

## 2012-02-05 LAB — URINE MICROSCOPIC-ADD ON

## 2012-02-05 LAB — GLUCOSE, CAPILLARY: Glucose-Capillary: 392 mg/dL — ABNORMAL HIGH (ref 70–99)

## 2012-02-05 MED ORDER — SODIUM CHLORIDE 0.9 % IV SOLN: Freq: Once | INTRAVENOUS | Status: DC

## 2012-02-05 MED ORDER — OXYCODONE-ACETAMINOPHEN 5-325 MG PO TABS
2.0000 | ORAL_TABLET | Freq: Once | ORAL | Status: AC
  Administered 2012-02-05: 2 via ORAL
  Filled 2012-02-05: qty 2

## 2012-02-05 MED ORDER — SODIUM CHLORIDE 0.9 % IV SOLN
INTRAVENOUS | Status: DC
  Administered 2012-02-05: 23:00:00 via INTRAVENOUS

## 2012-02-05 MED ORDER — PREDNISONE 5 MG PO TABS
5.0000 mg | ORAL_TABLET | Freq: Every day | ORAL | Status: DC
  Administered 2012-02-06 – 2012-02-07 (×2): 5 mg via ORAL
  Filled 2012-02-05 (×2): qty 1

## 2012-02-05 MED ORDER — DEXTROSE 50 % IV SOLN
25.0000 mL | INTRAVENOUS | Status: DC | PRN
  Filled 2012-02-05: qty 50

## 2012-02-05 MED ORDER — LEVOTHYROXINE SODIUM 137 MCG PO TABS
137.0000 ug | ORAL_TABLET | Freq: Every day | ORAL | Status: DC
  Administered 2012-02-06 – 2012-02-07 (×2): 137 ug via ORAL
  Filled 2012-02-05 (×2): qty 1

## 2012-02-05 MED ORDER — ENOXAPARIN SODIUM 40 MG/0.4ML ~~LOC~~ SOLN
40.0000 mg | SUBCUTANEOUS | Status: DC
  Administered 2012-02-06 (×2): 40 mg via SUBCUTANEOUS
  Filled 2012-02-05 (×4): qty 0.4

## 2012-02-05 MED ORDER — AMLODIPINE BESYLATE 5 MG PO TABS
5.0000 mg | ORAL_TABLET | Freq: Every day | ORAL | Status: DC
  Administered 2012-02-06 – 2012-02-07 (×2): 5 mg via ORAL
  Filled 2012-02-05 (×2): qty 1

## 2012-02-05 MED ORDER — SODIUM CHLORIDE 0.9 % IV BOLUS (SEPSIS)
1000.0000 mL | Freq: Once | INTRAVENOUS | Status: AC
  Administered 2012-02-05: 1000 mL via INTRAVENOUS

## 2012-02-05 MED ORDER — SODIUM CHLORIDE 0.9 % IV SOLN
INTRAVENOUS | Status: DC
  Administered 2012-02-05: 5.4 [IU]/h via INTRAVENOUS
  Filled 2012-02-05: qty 1

## 2012-02-05 MED ORDER — MYCOPHENOLATE MOFETIL 250 MG PO CAPS
500.0000 mg | ORAL_CAPSULE | Freq: Two times a day (BID) | ORAL | Status: DC
  Administered 2012-02-06 – 2012-02-07 (×4): 500 mg via ORAL
  Filled 2012-02-05 (×6): qty 2

## 2012-02-05 MED ORDER — FAMOTIDINE 10 MG PO TABS
10.0000 mg | ORAL_TABLET | Freq: Every day | ORAL | Status: DC
  Administered 2012-02-06: 10 mg via ORAL
  Filled 2012-02-05 (×2): qty 1

## 2012-02-05 MED ORDER — DEXTROSE-NACL 5-0.45 % IV SOLN
INTRAVENOUS | Status: DC
  Administered 2012-02-06: 02:00:00 via INTRAVENOUS

## 2012-02-05 MED ORDER — TACROLIMUS 1 MG PO CAPS
1.0000 mg | ORAL_CAPSULE | Freq: Two times a day (BID) | ORAL | Status: DC
  Administered 2012-02-06 – 2012-02-07 (×4): 1 mg via ORAL
  Filled 2012-02-05 (×6): qty 1

## 2012-02-05 MED ORDER — CARVEDILOL 25 MG PO TABS
50.0000 mg | ORAL_TABLET | Freq: Two times a day (BID) | ORAL | Status: DC
  Administered 2012-02-05 – 2012-02-07 (×4): 50 mg via ORAL
  Filled 2012-02-05 (×5): qty 2

## 2012-02-05 MED ORDER — SODIUM CHLORIDE 0.9 % IV SOLN
INTRAVENOUS | Status: AC
  Administered 2012-02-05: 1000 mL via INTRAVENOUS

## 2012-02-05 MED ORDER — MORPHINE SULFATE 2 MG/ML IJ SOLN
2.0000 mg | INTRAMUSCULAR | Status: DC | PRN
  Administered 2012-02-05 – 2012-02-07 (×5): 2 mg via INTRAVENOUS
  Filled 2012-02-05 (×5): qty 1

## 2012-02-05 NOTE — H&P (Signed)
[facility] History and Physical  [name] NWG:[id number] DOB: Aug 03, 1966 DOA: 02/05/2012  PCP: [name], MD, MD   Chief Complaint:  Chief Complaint  Patient presents with  . Hyperglycemia    states her machine read "high" today.  . Back Pain  . Hip Pain     HPI:  46 year old woman, with type 1 diabetes, status post renal transplant presented to the [facility] long emergency room today complaining of uncontrolled diabetes. She also mentioned that her back pain is a little bit worse. She reports being compliant with her insulin. She denies any fever chills. She denies any dysuria. She reports nausea and increased thirst. There have been no recent medication changes.  Review of Systems:  As per history of present illness also positive for some right hip pain All other systems reviewed and negative  Past Medical History  Diagnosis Date  . Diabetes mellitus   . Hypertension   . Kidney disease   . Hypothyroid   . Depression   . End stage renal disease     right arm AV graft, post transplant  . 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 Back surgery 2001  . Tubal ligation   . Dg av dialysis graft declot or   . Nephrectomy transplanted organ    Social History:  reports that she quit smoking about 13 years ago. Her smoking use included Cigarettes. She has a 11 pack-year smoking history. She has never used smokeless tobacco. She reports that she does not drink alcohol or use illicit drugs.  Allergies  Allergen Reactions  . Ace Inhibitors Other (See Comments)    cough  . Adhesive (Tape) Itching    Family History  Problem Relation Age of Onset  . Emphysema Mother   . Throat cancer Mother   . COPD Mother   . Cancer Mother   . Emphysema Father   . COPD Father   . Stroke Father     Prior to Admission medications   Medication Sig Start Date End Date Taking? Authorizing Provider  amLODipine  (NORVASC) 5 MG tablet Take 5 mg by mouth daily.     Yes Historical Provider, MD  carvedilol (COREG) 25 MG tablet Take 50 mg by mouth 2 (two) times daily.    Yes Historical Provider, MD  insulin aspart (NOVOLOG) 100 UNIT/ML injection Inject 8-14 Units into the skin See admin instructions. Sliding scale insulin; 8 units base, then add 2 units for each 100 score on CBG over 100. (200-299 = 10u; 300-399 = 12u; etc)   Yes Historical Provider, MD  insulin glargine (LANTUS) 100 UNIT/ML injection 40 units in the morning and 30 units in the evening 09/26/11  Yes Richarda Overlie, MD  levothyroxine (SYNTHROID, LEVOTHROID) 137 MCG tablet Take 137 mcg by mouth daily. Name brand   Yes Historical Provider, MD  mycophenolate (CELLCEPT) 250 MG capsule Take 500 mg by mouth 2 (two) times daily.    Yes Historical Provider, MD  predniSONE (DELTASONE) 5 MG tablet Take 5 mg by mouth daily.     Yes Historical Provider, MD  ranitidine (ZANTAC) 150 MG capsule Take 150 mg by mouth daily.    Yes Historical Provider, MD  tacrolimus (PROGRAF) 1 MG capsule Take 1 mg by mouth 2 (two) times daily.    Yes Historical Provider, MD   Physical Exam: Filed Vitals:   02/05/12 2117 02/05/12 2130 02/05/12 2145 02/05/12 2200  BP:  110/53  132/60  Pulse:  79 78   Temp:      Resp:  17 14 15   Height: 5\' 11"  (1.803 m)     Weight: 117.935 kg (260 lb)     SpO2:  94% 96%      General:  Alert and oriented x3  Eyes: Pupil equal round react to light and accommodation  ENT: No pharyngeal erythema  Neck: No JVD  Cardiovascular: Regular rate and rhythm without murmurs rubs or gallops  Respiratory: Clear to auscultation bilaterally  Abdomen: Soft nontender bowel sounds are present  Skin: Covered in tattoos. Right forearm AV fistula with thrill  Musculoskeletal: Intact muscle bulk and tone  Psychiatric: Euthymic  Neurologic: Cranial nerves 2-12 intact,   Labs on Admission:  Basic Metabolic Panel:  Lab 02/05/12 1610 02/05/12 1910    NA 131* 127*  K 4.7 5.2*  CL 94* 89*  CO2 17* 17*  GLUCOSE 619* 706*  BUN 28* 28*  CREATININE 1.19* 1.21*  CALCIUM 9.8 10.5  MG -- --  PHOS -- --   Liver Function Tests:  Lab 02/05/12 1910  AST 11  ALT 12  ALKPHOS 140*  BILITOT 0.4  PROT 7.7  ALBUMIN 3.7    Lab 02/05/12 1910  LIPASE 10*  AMYLASE --   No results found for this basename: AMMONIA:5 in the last 168 hours CBC:  Lab 02/05/12 2145 02/05/12 1910  WBC 5.8 7.7  NEUTROABS -- 6.1  HGB 12.8 14.2  HCT 40.1 44.6  MCV 85.7 86.4  PLT 189 247   Cardiac Enzymes: No results found for this basename: CKTOTAL:5,CKMB:5,CKMBINDEX:5,TROPONINI:5 in the last 168 hours BNP: No components found with this basename: POCBNP:5 CBG:  Lab 02/05/12 2221  GLUCAP 522*    Radiological Exams on Admission: No results found.    Assessment/Plan Principal Problem:  *DKA, type 1 Active Problems:  HYPERTENSION  Cough  DM (diabetes mellitus), type 1, uncontrolled with complications  Hypertension  End stage renal disease  Hyperkalemia  Hyponatremia  DKA - unclear cause . Suspect brittle diabetes  Will admit her to telemetry floor, start her on IV insulin. She still has good urine output. Her anion gap is 19, her bicarbonate is 17. We'll monitor CBGs every hour, basic metabolic panel every 4 hours. We'll start her on IV fluids normal saline as she seems to be dehydrated by physical exam.   HYPERTENSION   continue home medications without changes   S/p DDKT Her baseline creatinine is 1.0. She relates she has not been eating and drinking well. Creatinine is at 1.2 We'll start her on IV fluids and continue to monitor her creatinine closely.   Hyponatremia (09/22/2011) there is a component of pseudohyponatremia secondary to her blood glucose actually her corrected sodium is 134. Will start her on IV fluids and recheck a basic metabolic panel in the morning. Her mucous membranes he is significantly dry, so this most likely  secondary to dehydration.        Code Status: Full Family Communication: Husband Disposition Plan: Home  Laythan Hayter, MD  [facility] Pager [phone]  If 7PM-7AM, please contact night-coverage [email or web address] Password [facility] 02/05/2012, 10:56 PM

## 2012-02-05 NOTE — ED Provider Notes (Signed)
History     CSN: [id number]  Arrival date & time 02/05/12  1649   First MD Initiated Contact with Patient 02/05/12 1901      Chief Complaint  Patient presents with  . Hyperglycemia    states her machine read "high" today.  . Back Pain  . Hip Pain    (Consider location/radiation/quality/duration/timing/severity/associated sxs/prior treatment) Patient is a 46 y.o. female presenting with back pain and hip pain. The history is provided by the patient and the spouse.  Back Pain   Hip Pain   patient here with chronic back pain has become worse over the past week. She also notes hyperglycemia at home blood sugars over 500. No fever, vomiting, diarrhea. She is status post kidney transplant 2 years ago. Hasn't taken her medications appropriately according to her. Does note polyuria and polydipsia. History of DKA in the past. Notes that she has been noncompliant with her diabetic diet  Past Medical History  Diagnosis Date  . Diabetes mellitus   . Hypertension   . Kidney disease   . Hypothyroid   . Depression   . End stage renal disease     right arm AV graft, post transplant  . 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 Back surgery 2001  . Tubal ligation   . Dg av dialysis graft declot or   . Nephrectomy transplanted organ     Family History  Problem Relation Age of Onset  . Emphysema Mother   . Throat cancer Mother   . COPD Mother   . Cancer Mother   . Emphysema Father   . COPD Father   . Stroke Father     History  Substance Use Topics  . Smoking status: Former Smoker -- 1.0 packs/day for 11 years    Types: Cigarettes    Quit date: 09/21/1998  . Smokeless tobacco: Never Used  . Alcohol Use: No    OB History    Grav Para Term Preterm Abortions TAB SAB Ect Mult Living                  Review of Systems  Musculoskeletal: Positive for back pain.  All other systems reviewed and are  negative.    Allergies  Ace inhibitors and Adhesive  Home Medications   Current Outpatient Rx  Name Route Sig Dispense Refill  . AMLODIPINE BESYLATE 5 MG PO TABS Oral Take 5 mg by mouth daily.      [name] CARVEDILOL 25 MG PO TABS Oral Take 50 mg by mouth 2 (two) times daily.     . INSULIN ASPART 100 UNIT/ML [place] SOLN Subcutaneous Inject 8-14 Units into the skin See admin instructions. Sliding scale insulin; 8 units base, then add 2 units for each 100 score on CBG over 100. (200-299 = 10u; 300-399 = 12u; etc)    . INSULIN GLARGINE 100 UNIT/ML [place] SOLN  40 units in the morning and 30 units in the evening 100 mL 50  . LEVOTHYROXINE SODIUM 137 MCG PO TABS Oral Take 137 mcg by mouth daily. Name brand    . MYCOPHENOLATE MOFETIL 250 MG PO CAPS Oral Take 500 mg by mouth 2 (two) times daily.     [name] PREDNISONE 5 MG PO TABS Oral Take 5 mg by mouth daily.      [name] RANITIDINE HCL 150 MG PO CAPS Oral Take 150 mg by mouth daily.     [name]  TACROLIMUS 1 MG PO CAPS Oral Take 1 mg by mouth 2 (two) times daily.       BP 129/84  Pulse 77  Temp 97.7 F (36.5 C)  Resp 18  SpO2 96%  LMP 11/19/2011  Physical Exam  Nursing note and vitals reviewed. Constitutional: She is oriented to person, place, and time. Vital signs are normal. She appears well-developed and well-nourished.  Non-toxic appearance. No distress.  HENT:  Head: Normocephalic and atraumatic.  Eyes: Conjunctivae, EOM and lids are normal. Pupils are equal, round, and reactive to light.  Neck: Normal range of motion. Neck supple. No tracheal deviation present. No mass present.  Cardiovascular: Normal rate, regular rhythm and normal heart sounds.  Exam reveals no gallop.   No murmur heard. Pulmonary/Chest: Effort normal and breath sounds normal. No stridor. No respiratory distress. She has no decreased breath sounds. She has no wheezes. She has no rhonchi. She has no rales.  Abdominal: Soft. Normal appearance and bowel sounds are normal. She exhibits no  distension. There is no tenderness. There is no rebound and no CVA tenderness.  Musculoskeletal: Normal range of motion. She exhibits no edema and no tenderness.       Muscles to her back pain right flank area. This is her chronic location  Neurological: She is alert and oriented to person, place, and time. She has normal strength. No cranial nerve deficit or sensory deficit. GCS eye subscore is 4. GCS verbal subscore is 5. GCS motor subscore is 6.  Skin: Skin is warm and dry. No abrasion and no rash noted.  Psychiatric: She has a normal mood and affect. Her speech is normal and behavior is normal.    ED Course  Procedures (including critical care time)   Labs Reviewed  CBC  DIFFERENTIAL  URINALYSIS, ROUTINE W REFLEX MICROSCOPIC  URINE CULTURE  COMPREHENSIVE METABOLIC PANEL  BLOOD GAS, VENOUS  LIPASE, BLOOD   No results found.   No diagnosis found.    MDM  Patient given IV fluids here. Blood sugar results noted and discussed with Dr. Lavera Guise from [facility] and he will be admitting the patient. He will start the patient's insulin therapy        Toy Baker, MD 02/05/12 2049

## 2012-02-05 NOTE — ED Notes (Signed)
Lab call to inform of abnormal results - glucose 706- RN and EDP notified.

## 2012-02-05 NOTE — ED Notes (Addendum)
Pt has chronic back pain which has been hurting more for the last week. She also is a diabetic and her sugar she reports has been high for the last 2 days. Meter at home is reading high along with [facility] meter. Pt is a kidney transplant pt from two years ago. Fistula in R arm.

## 2012-02-05 NOTE — ED Notes (Signed)
Pt's blood sugar read "HI" on meter.

## 2012-02-06 DIAGNOSIS — E1029 Type 1 diabetes mellitus with other diabetic kidney complication: Secondary | ICD-10-CM

## 2012-02-06 DIAGNOSIS — Z940 Kidney transplant status: Secondary | ICD-10-CM

## 2012-02-06 DIAGNOSIS — E1010 Type 1 diabetes mellitus with ketoacidosis without coma: Secondary | ICD-10-CM

## 2012-02-06 DIAGNOSIS — I10 Essential (primary) hypertension: Secondary | ICD-10-CM

## 2012-02-06 LAB — GLUCOSE, CAPILLARY
Glucose-Capillary: 115 mg/dL — ABNORMAL HIGH (ref 70–99)
Glucose-Capillary: 136 mg/dL — ABNORMAL HIGH (ref 70–99)
Glucose-Capillary: 263 mg/dL — ABNORMAL HIGH (ref 70–99)
Glucose-Capillary: 83 mg/dL (ref 70–99)

## 2012-02-06 LAB — BASIC METABOLIC PANEL
BUN: 28 mg/dL — ABNORMAL HIGH (ref 6–23)
BUN: 20 mg/dL (ref 6–23)
CO2: 17 mEq/L — ABNORMAL LOW (ref 19–32)
CO2: 20 mEq/L (ref 19–32)
CO2: 21 mEq/L (ref 19–32)
Calcium: 9.5 mg/dL (ref 8.4–10.5)
Chloride: 94 mEq/L — ABNORMAL LOW (ref 96–112)
Chloride: 100 mEq/L (ref 96–112)
Creatinine, Ser: 1.19 mg/dL — ABNORMAL HIGH (ref 0.50–1.10)
Creatinine, Ser: 1.09 mg/dL (ref 0.50–1.10)
GFR calc Af Amer: 63 mL/min — ABNORMAL LOW (ref >90)
Glucose, Bld: 251 mg/dL — ABNORMAL HIGH (ref 70–99)
Glucose, Bld: 116 mg/dL — ABNORMAL HIGH (ref 70–99)
Potassium: 4.7 mEq/L (ref 3.5–5.1)
Potassium: 3.7 mEq/L (ref 3.5–5.1)
Sodium: 133 mEq/L — ABNORMAL LOW (ref 135–145)
Sodium: 135 mEq/L (ref 135–145)

## 2012-02-06 LAB — HEMOGLOBIN A1C
Hgb A1c MFr Bld: 10.3 % — ABNORMAL HIGH (ref <5.7)
Mean Plasma Glucose: 249 mg/dL — ABNORMAL HIGH (ref <117)

## 2012-02-06 LAB — MRSA PCR SCREENING: MRSA by PCR: POSITIVE — AB

## 2012-02-06 LAB — PREGNANCY, URINE: Preg Test, Ur: NEGATIVE

## 2012-02-06 MED ORDER — INSULIN GLARGINE 100 UNIT/ML ~~LOC~~ SOLN
30.0000 [IU] | Freq: Two times a day (BID) | SUBCUTANEOUS | Status: DC
  Administered 2012-02-06: 30 [IU] via SUBCUTANEOUS

## 2012-02-06 MED ORDER — INSULIN ASPART PROT & ASPART (70-30 MIX) 100 UNIT/ML ~~LOC~~ SUSP
25.0000 [IU] | Freq: Two times a day (BID) | SUBCUTANEOUS | Status: DC
  Administered 2012-02-06 – 2012-02-07 (×2): 25 [IU] via SUBCUTANEOUS
  Filled 2012-02-06: qty 3

## 2012-02-06 MED ORDER — MUPIROCIN 2 % EX OINT
1.0000 "application " | TOPICAL_OINTMENT | Freq: Two times a day (BID) | CUTANEOUS | Status: DC
  Administered 2012-02-06 – 2012-02-07 (×3): 1 via NASAL
  Filled 2012-02-06: qty 22

## 2012-02-06 MED ORDER — INSULIN ASPART 100 UNIT/ML ~~LOC~~ SOLN
4.0000 [IU] | Freq: Three times a day (TID) | SUBCUTANEOUS | Status: DC
  Administered 2012-02-06: 4 [IU] via SUBCUTANEOUS

## 2012-02-06 MED ORDER — INSULIN GLARGINE 100 UNIT/ML ~~LOC~~ SOLN: 30.0000 [IU] | Freq: Two times a day (BID) | SUBCUTANEOUS | Status: DC

## 2012-02-06 MED ORDER — HYDROCODONE-ACETAMINOPHEN 5-325 MG PO TABS: 1.0000 | ORAL_TABLET | ORAL | Status: DC | PRN

## 2012-02-06 MED ORDER — INSULIN ASPART 100 UNIT/ML ~~LOC~~ SOLN
0.0000 [IU] | Freq: Three times a day (TID) | SUBCUTANEOUS | Status: DC
  Administered 2012-02-06: 7 [IU] via SUBCUTANEOUS
  Administered 2012-02-06: 3 [IU] via SUBCUTANEOUS
  Administered 2012-02-06: 7 [IU] via SUBCUTANEOUS
  Administered 2012-02-07: 11 [IU] via SUBCUTANEOUS

## 2012-02-06 MED ORDER — CHLORHEXIDINE GLUCONATE CLOTH 2 % EX PADS
6.0000 | MEDICATED_PAD | Freq: Every day | CUTANEOUS | Status: DC
  Administered 2012-02-06 – 2012-02-07 (×2): 6 via TOPICAL

## 2012-02-06 MED ORDER — ACETAMINOPHEN 325 MG PO TABS
650.0000 mg | ORAL_TABLET | Freq: Four times a day (QID) | ORAL | Status: DC | PRN
  Administered 2012-02-06: 650 mg via ORAL
  Filled 2012-02-06: qty 2

## 2012-02-06 MED ORDER — ONDANSETRON HCL 4 MG/2ML IJ SOLN: 4.0000 mg | Freq: Four times a day (QID) | INTRAMUSCULAR | Status: DC | PRN

## 2012-02-06 MED ORDER — INSULIN ASPART 100 UNIT/ML ~~LOC~~ SOLN
0.0000 [IU] | Freq: Every day | SUBCUTANEOUS | Status: DC
  Administered 2012-02-06: 2 [IU] via SUBCUTANEOUS

## 2012-02-06 NOTE — Progress Notes (Signed)
CRITICAL VALUE ALERT  Critical value received:  MRSA PCR positive  Date of notification:  02/06/2012  Time of notification:  0335 Critical value read back:yes  Nurse who received alert:  L. Vergel de Lucy Chris RN  MD notified (1st page):  M. Burnadette Peter NP  Time of first page:  0350  MD notified (2nd page):  Time of second page:  Responding MD:  M. Burnadette Peter NP  Time MD responded:  [phone]

## 2012-02-06 NOTE — Progress Notes (Signed)
[facility] PROGRESS NOTE  [name] NFA:[id number] DOB: [birth year] DOA: 02/05/2012 PCP: [name], MD, MD  Assessment/Plan: 1. DKA, type I: Resolved. Cause unclear--no evidence of infection--urinalysis negative, no pulmonary symptoms. 2. Diabetes mellitus type 1, uncontrolled by hemoglobin A1c: Compliance may be an issue (finances). She has a difficult time affording Lantus and will prefer a less expensive option. Start insulin 70/30. Continue sliding scale insulin. 3. Hyperkalemia: Resolved. 4. Hypertension: Stable. Continue Norvasc, Coreg. 5. History of renal transplant April 2011/chronic immunosuppression: Appears stable. Continue CellCept, tacrolimus and prednisone. 6. History of hypothyroidism: Appears stable. Continue levothyroxine.  Much improved. Anticipate discharge June 2.  Code Status: Full code Family Communication: None bedside Disposition Plan: Home when improved  Brendia Sacks, MD  [facility] Pager [phone]. If 8PM-8AM, please contact night-coverage at [email or web address], password [facility] 02/06/2012, 9:20 AM  LOS: 1 day   Brief narrative: 46 year old woman presented emergency department with high blood sugars in complaint of worsening chronic back pain. Admitted for diabetic ketoacidosis.  Chart review:  11/2011 hospitalization: Healthcare associated pneumonia, DKA, hyponatremia, acute renal failure.  09/2011 hospitalization: DKA.  Consultants:  None  Procedures:  None  HPI/Subjective: Afebrile, vital signs stable. Blood sugars well controlled. No complaints.   Objective: Filed Vitals:   02/05/12 2200 02/05/12 2300 02/05/12 2330 02/06/12 0530  BP: 132/60 136/68 148/76 145/79  Pulse:   83 70  Temp:   97.6 F (36.4 C) 97.4 F (36.3 C)  TempSrc:   Oral Oral  Resp: 15 16 18 18   Height:   5\' 11"  (1.803 m)   Weight:   110.542 kg (243 lb 11.2 oz)   SpO2:   93% 92%    Intake/Output Summary (Last 24 hours) at 02/06/12  0920 Last data filed at 02/06/12 0900  Gross per 24 hour  Intake    240 ml  Output      0 ml  Net    240 ml    Exam:   General:  Appears calm and comfortable.  Cardiovascular: Regular rate and rhythm. No murmur, rub, gallop. No lower extremity edema.  Respiratory: Clear to auscultation bilaterally. No wheezes, rales or rhonchi. Normal respiratory effort.  Psychiatric: Grossly normal mood and affect. Speech fluent and appropriate.  Data Reviewed: Basic Metabolic Panel:  Lab 02/06/12 2841 02/06/12 0205 02/05/12 2145 02/05/12 1910  NA 135 133* 131* 127*  K 3.7 3.9 4.7 5.2*  CL 102 100 94* 89*  CO2 21 20 17* 17*  GLUCOSE 116* 251* 619* 706*  BUN 20 24* 28* 28*  CREATININE 0.97 1.09 1.19* 1.21*  CALCIUM 9.9 9.5 9.8 10.5  MG -- -- -- --  PHOS -- -- -- --   Liver Function Tests:  Lab 02/05/12 1910  AST 11  ALT 12  ALKPHOS 140*  BILITOT 0.4  PROT 7.7  ALBUMIN 3.7    Lab 02/05/12 1910  LIPASE 10*  AMYLASE --   CBC:  Lab 02/05/12 2145 02/05/12 1910  WBC 5.8 7.7  NEUTROABS -- 6.1  HGB 12.8 14.2  HCT 40.1 44.6  MCV 85.7 86.4  PLT 189 247   CBG:  Lab 02/06/12 0905 02/06/12 0705 02/06/12 0612 02/06/12 0506 02/06/12 0405  GLUCAP 136* 83 115* 160* 179*    Recent Results (from the past 240 hour(s))  MRSA PCR SCREENING     Status: Abnormal   Collection Time   02/06/12 12:03 AM      Component Value Range Status Comment   MRSA by PCR POSITIVE (*)  NEGATIVE  Final     Scheduled Meds:   . amLODipine  5 mg Oral Daily  . carvedilol  50 mg Oral BID  . Chlorhexidine Gluconate Cloth  6 each Topical Q0600  . enoxaparin  40 mg Subcutaneous Q24H  . famotidine  10 mg Oral QHS  . insulin aspart  0-20 Units Subcutaneous TID WC  . insulin aspart  0-5 Units Subcutaneous QHS  . insulin aspart  4 Units Subcutaneous TID WC  . insulin glargine  30 Units Subcutaneous BID  . levothyroxine  137 mcg Oral Daily  . mupirocin ointment  1 application Nasal BID  . mycophenolate  500  mg Oral BID  . oxyCODONE-acetaminophen  2 tablet Oral Once  . predniSONE  5 mg Oral Daily  . sodium chloride  1,000 mL Intravenous Once  . tacrolimus  1 mg Oral BID  . DISCONTD: sodium chloride   Intravenous Once  . DISCONTD: insulin glargine  30 Units Subcutaneous BID   Continuous Infusions:   . sodium chloride Stopped (02/05/12 2220)  . sodium chloride 100 mL/hr at 02/05/12 2304  . dextrose 5 % and 0.45% NaCl 75 mL/hr at 02/06/12 0201  . insulin (NOVOLIN-R) infusion 0.2 Units/hr (02/06/12 0708)    Principal Problem:  *DKA, type 1 Active Problems:  DM (diabetes mellitus), type 1, uncontrolled with complications  History of kidney transplant  HYPERTENSION  Hyperkalemia  Hyponatremia

## 2012-02-06 NOTE — Progress Notes (Signed)
Turned off insulin drip at 0945.

## 2012-02-07 LAB — GLUCOSE, CAPILLARY
Glucose-Capillary: 54 mg/dL — ABNORMAL LOW (ref 70–99)
Glucose-Capillary: 105 mg/dL — ABNORMAL HIGH (ref 70–99)

## 2012-02-07 LAB — URINE CULTURE: Colony Count: 85000

## 2012-02-07 MED ORDER — INSULIN ASPART 100 UNIT/ML ~~LOC~~ SOLN: SUBCUTANEOUS | Status: DC

## 2012-02-07 MED ORDER — INSULIN ASPART PROT & ASPART (70-30 MIX) 100 UNIT/ML ~~LOC~~ SUSP: 25.0000 [IU] | Freq: Two times a day (BID) | SUBCUTANEOUS | Status: DC

## 2012-02-07 MED ORDER — HYDROCODONE-ACETAMINOPHEN 5-325 MG PO TABS: 1.0000 | ORAL_TABLET | ORAL | Status: DC | PRN

## 2012-02-07 NOTE — Progress Notes (Signed)
[facility] PROGRESS NOTE  [name] ZOX:[id number] DOB: [birth year] DOA: 02/05/2012 PCP: [name], MD, MD  Assessment/Plan: 1. DKA, type I: Resolved. Cause unclear--no evidence of infection--urinalysis negative, no pulmonary symptoms. Compliance may have been an issue. 2. Diabetes mellitus type 1, uncontrolled by hemoglobin A1c 10.3: She has a difficult time affording Lantus and would prefer a less expensive option. Continue insulin 70/30. New sliding scale insulin. 3. Hyperkalemia: Resolved. 4. Hypertension: Stable. Continue Norvasc, Coreg. 5. History of renal transplant April 2011/chronic immunosuppression: Appears stable. Continue CellCept, tacrolimus and prednisone. 6. History of hypothyroidism: Appears stable. Continue levothyroxine.  Much improved. Home today.  Code Status: Full code Family Communication: None bedside Disposition Plan: Home when improved  Brendia Sacks, MD  [facility] Pager [phone]. If 8PM-8AM, please contact night-coverage at [email or web address], password [facility] 02/07/2012, 1:05 PM  LOS: 2 days   Brief narrative: 46 year old woman presented emergency department with high blood sugars in complaint of worsening chronic back pain. Admitted for diabetic ketoacidosis.  Chart review:  11/2011 hospitalization: Healthcare associated pneumonia, DKA, hyponatremia, acute renal failure.  09/2011 hospitalization: DKA.  Consultants:  None  Procedures:  None  HPI/Subjective: One episode of hypoglycemia this morning after receiving 11 units sliding scale coverage. Tolerating food. Feels well. Ready to go home.  Objective: Filed Vitals:   02/06/12 1400 02/06/12 1900 02/06/12 2049 02/07/12 0518  BP: 116/73 129/78 138/81 148/72  Pulse: 73 77 74 71  Temp: 97.7 F (36.5 C) 97.7 F (36.5 C) 97.9 F (36.6 C) 97.9 F (36.6 C)  TempSrc: Oral Oral Oral Oral  Resp: 18 18 18 18   Height:      Weight:      SpO2: 95% 95% 95% 95%     Intake/Output Summary (Last 24 hours) at 02/07/12 1305 Last data filed at 02/07/12 0519  Gross per 24 hour  Intake    240 ml  Output   1600 ml  Net  -1360 ml    Exam:   General:  Appears calm and comfortable.  Cardiovascular: Regular rate and rhythm. No murmur, rub, gallop. No lower extremity edema.  Respiratory: Clear to auscultation bilaterally. No wheezes, rales or rhonchi. Normal respiratory effort.  Psychiatric: Grossly normal mood and affect. Speech fluent and appropriate.  Data Reviewed: Basic Metabolic Panel:  Lab 02/06/12 8295 02/06/12 0205 02/05/12 2145 02/05/12 1910  NA 135 133* 131* 127*  K 3.7 3.9 4.7 5.2*  CL 102 100 94* 89*  CO2 21 20 17* 17*  GLUCOSE 116* 251* 619* 706*  BUN 20 24* 28* 28*  CREATININE 0.97 1.09 1.19* 1.21*  CALCIUM 9.9 9.5 9.8 10.5  MG -- -- -- --  PHOS -- -- -- --   Liver Function Tests:  Lab 02/05/12 1910  AST 11  ALT 12  ALKPHOS 140*  BILITOT 0.4  PROT 7.7  ALBUMIN 3.7    Lab 02/05/12 1910  LIPASE 10*  AMYLASE --   CBC:  Lab 02/05/12 2145 02/05/12 1910  WBC 5.8 7.7  NEUTROABS -- 6.1  HGB 12.8 14.2  HCT 40.1 44.6  MCV 85.7 86.4  PLT 189 247   CBG:  Lab 02/07/12 0744 02/06/12 2047 02/06/12 1736 02/06/12 1216 02/06/12 0905  GLUCAP 254* 233* 251* 238* 136*    Recent Results (from the past 240 hour(s))  URINE CULTURE     Status: Normal   Collection Time   02/05/12  7:13 PM      Component Value Range Status Comment   Specimen Description  URINE, RANDOM   Final    Special Requests NONE   Final    Culture  Setup Time [id number]   Final    Colony Count 85,000 COLONIES/ML   Final    Culture     Final    Value: Multiple bacterial morphotypes present, none predominant. Suggest appropriate recollection if clinically indicated.   Report Status 02/07/2012 FINAL   Final   MRSA PCR SCREENING     Status: Abnormal   Collection Time   02/06/12 12:03 AM      Component Value Range Status Comment   MRSA by PCR POSITIVE  (*) NEGATIVE  Final     Scheduled Meds:    . amLODipine  5 mg Oral Daily  . carvedilol  50 mg Oral BID  . Chlorhexidine Gluconate Cloth  6 each Topical Q0600  . enoxaparin  40 mg Subcutaneous Q24H  . famotidine  10 mg Oral QHS  . insulin aspart  0-20 Units Subcutaneous TID WC  . insulin aspart  0-5 Units Subcutaneous QHS  . insulin aspart protamine-insulin aspart  25 Units Subcutaneous BID WC  . levothyroxine  137 mcg Oral Daily  . mupirocin ointment  1 application Nasal BID  . mycophenolate  500 mg Oral BID  . predniSONE  5 mg Oral Daily  . tacrolimus  1 mg Oral BID   Continuous Infusions:   Principal Problem:  *DKA, type 1 Active Problems:  DM (diabetes mellitus), type 1, uncontrolled with complications  History of kidney transplant  HYPERTENSION  Hyperkalemia  Hyponatremia

## 2012-02-07 NOTE — Progress Notes (Signed)
Patient states she is feeling well this am.

## 2012-02-07 NOTE — Discharge Summary (Addendum)
Physician Discharge Summary  [name]:[id number] DOB: [birth year] DOA: 02/05/2012  PCP: [name] Cords, MD, MD  Admit date: 02/05/2012 Discharge date: 02/07/2012  Recommendations for Outpatient Follow-up:  1. Follow-up diabetes--patient transitioned from Lantus insulin 70/30 based on cost. No specific laboratory followup recommended.  Follow-up Information    Follow up with [name] A, MD in 1 week.   Contact information:   [address] [employer] [place] [address] [phone]         Discharge Diagnoses:  1. DKA, resolved 2. Diabetes mellitus type 1, uncontrolled 3. Hyperkalemia, resolved 4. Hypertension, stable 5. History of renal transplant April 2011/chronic immunosuppression 6. Hypothyroidism  Discharge Condition: Improved Disposition: Home  Diet recommendation: Carb modified  History of present illness:  46 year old woman presented to the emergency department with high blood sugars and complained of worsening chronic back pain. Admitted for diabetic ketoacidosis.  Hospital Course:  [name] was admitted to medical floor and treated for DKA with insulin infusion. Infectious workup was unremarkable and DKA quickly resolved. Etiology unclear but might be related to compliance issue secondary to finances. Patient desired [name] less expensive insulin and so transitioned from Lantus to insulin 70/30. She will continue as an outpatient as well as sliding scale insulin. Hyperkalemia quickly resolved with fluids. Other issues remained stable and are outlined below. 1. DKA, type I: Resolved. Cause unclear--no evidence of infection--urinalysis negative, no pulmonary symptoms. Compliance may have been an issue.  2. Diabetes mellitus type 1, uncontrolled by hemoglobin A1c 10.3: She has [name] difficult time affording Lantus and would prefer [name] less expensive option. Continue insulin 70/30. New sliding scale insulin.  3. Hyperkalemia:  Resolved.  4. Hypertension: Stable. Continue Norvasc, Coreg.  5. History of renal transplant April 2011/chronic immunosuppression: Appears stable. Continue CellCept, tacrolimus and prednisone.  6. History of hypothyroidism: Appears stable. Continue levothyroxine.  Consultants:  None  Procedures:  None  Discharge Instructions  Discharge Orders    Future Orders Please Complete By Expires   Diet Carb Modified      Discharge instructions      Comments:   Followup with your primary care physician in one to 2 weeks.   Activity as tolerated - No restrictions        Medication List  As of 02/07/2012  1:31 PM   STOP taking these medications         insulin glargine 100 UNIT/ML injection         TAKE these medications         amLODipine 5 MG tablet   Commonly known as: NORVASC   Take 5 mg by mouth daily.      carvedilol 25 MG tablet   Commonly known as: COREG   Take 50 mg by mouth 2 (two) times daily.      HYDROcodone-acetaminophen 5-325 MG per tablet   Commonly known as: NORCO   Take 1 tablet by mouth every 4 (four) hours as needed.      insulin aspart 100 UNIT/ML injection   Commonly known as: novoLOG   0-9 Units, Subcutaneous, 3 times daily with meals  CBG < 70: 0 Units--drink orange juice  CBG 70 - 120: 0 units   CBG 121 - 150: 1 unit   CBG 151 - 200: 2 units   CBG 201 - 250: 3 units   CBG 251 - 300: 5 units   CBG 301 - 350: 7 units   CBG 351 - 400: 9 units  CBG > 400: Take 11 units and call MD      insulin aspart protamine-insulin aspart (70-30) 100 UNIT/ML injection   Commonly known as: NOVOLOG 70/30   Inject 25 Units into the skin 2 (two) times daily with [name] meal.      levothyroxine 137 MCG tablet   Commonly known as: SYNTHROID, LEVOTHROID   Take 137 mcg by mouth daily. Name brand      mycophenolate 250 MG capsule   Commonly known as: CELLCEPT   Take 500 mg by mouth 2 (two) times daily.      predniSONE 5 MG tablet   Commonly known as:  DELTASONE   Take 5 mg by mouth daily.      ranitidine 150 MG capsule   Commonly known as: ZANTAC   Take 150 mg by mouth daily.      tacrolimus 1 MG capsule   Commonly known as: PROGRAF   Take 1 mg by mouth 2 (two) times daily.           The results of significant diagnostics from this hospitalization (including imaging, microbiology, ancillary and laboratory) are listed below for reference.    Microbiology: Recent Results (from the past 240 hour(s))  URINE CULTURE     Status: Normal   Collection Time   02/05/12  7:13 PM      Component Value Range Status Comment   Specimen Description URINE, RANDOM   Final    Special Requests NONE   Final    Culture  Setup Time [id number]   Final    Colony Count 85,000 COLONIES/ML   Final    Culture     Final    Value: Multiple bacterial morphotypes present, none predominant. Suggest appropriate recollection if clinically indicated.   Report Status 02/07/2012 FINAL   Final   MRSA PCR SCREENING     Status: Abnormal   Collection Time   02/06/12 12:03 AM      Component Value Range Status Comment   MRSA by PCR POSITIVE (*) NEGATIVE  Final     Labs: Basic Metabolic Panel:  Lab 02/06/12 8657 02/06/12 0205 02/05/12 2145 02/05/12 1910  NA 135 133* 131* 127*  K 3.7 3.9 4.7 5.2*  CL 102 100 94* 89*  CO2 21 20 17* 17*  GLUCOSE 116* 251* 619* 706*  BUN 20 24* 28* 28*  CREATININE 0.97 1.09 1.19* 1.21*  CALCIUM 9.9 9.5 9.8 10.5  MG -- -- -- --  PHOS -- -- -- --   Liver Function Tests:  Lab 02/05/12 1910  AST 11  ALT 12  ALKPHOS 140*  BILITOT 0.4  PROT 7.7  ALBUMIN 3.7    Lab 02/05/12 1910  LIPASE 10*  AMYLASE --   CBC:  Lab 02/05/12 2145 02/05/12 1910  WBC 5.8 7.7  NEUTROABS -- 6.1  HGB 12.8 14.2  HCT 40.1 44.6  MCV 85.7 86.4  PLT 189 247   CBG:  Lab 02/07/12 1323 02/07/12 1225 02/07/12 0744 02/06/12 2047 02/06/12 1736  GLUCAP 105* 54* 254* 233* 251*    Principal Problem:  *DKA, type 1 Active Problems:  DM  (diabetes mellitus), type 1, uncontrolled with complications  History of kidney transplant  HYPERTENSION  Hyperkalemia  Hyponatremia   Time coordinating discharge: 20 minutes.  Signed:  Brendia Sacks, MD [facility] 02/07/2012, 1:31 PM

## 2012-02-07 NOTE — Progress Notes (Addendum)
Patients glucose = 54 prior to lunch.  Patient eating lunch tray and gave patient OJ.  Asked NT to recheck glucose in 15 minutes. Glucose increased to 105 after meal.

## 2012-02-08 LAB — GLUCOSE, CAPILLARY: Glucose-Capillary: >600 mg/dL (ref 70–99)

## 2012-02-14 ENCOUNTER — Emergency Department (HOSPITAL_COMMUNITY)
Admission: EM | Admit: 2012-02-14 | Discharge: 2012-02-14 | Disposition: A | Discharge status: Home / Self Care | Payer: Medicare Other | Attending: Emergency Medicine | Admitting: Emergency Medicine

## 2012-02-14 ENCOUNTER — Encounter (HOSPITAL_COMMUNITY): Payer: Self-pay | Admitting: Emergency Medicine

## 2012-02-14 DIAGNOSIS — E119 Type 2 diabetes mellitus without complications: Secondary | ICD-10-CM | POA: Insufficient documentation

## 2012-02-14 DIAGNOSIS — I120 Hypertensive chronic kidney disease with stage 5 chronic kidney disease or end stage renal disease: Secondary | ICD-10-CM | POA: Insufficient documentation

## 2012-02-14 DIAGNOSIS — N186 End stage renal disease: Secondary | ICD-10-CM | POA: Insufficient documentation

## 2012-02-14 DIAGNOSIS — Z79899 Other long term (current) drug therapy: Secondary | ICD-10-CM | POA: Insufficient documentation

## 2012-02-14 DIAGNOSIS — E039 Hypothyroidism, unspecified: Secondary | ICD-10-CM | POA: Insufficient documentation

## 2012-02-14 DIAGNOSIS — Z794 Long term (current) use of insulin: Secondary | ICD-10-CM | POA: Insufficient documentation

## 2012-02-14 DIAGNOSIS — M5430 Sciatica, unspecified side: Secondary | ICD-10-CM | POA: Insufficient documentation

## 2012-02-14 DIAGNOSIS — Z87891 Personal history of nicotine dependence: Secondary | ICD-10-CM | POA: Insufficient documentation

## 2012-02-14 MED ORDER — OXYCODONE-ACETAMINOPHEN 5-325 MG PO TABS: 1.0000 | ORAL_TABLET | Freq: Once | ORAL | Status: DC

## 2012-02-14 MED ORDER — OXYCODONE-ACETAMINOPHEN 5-325 MG PO TABS
2.0000 | ORAL_TABLET | Freq: Once | ORAL | Status: AC
  Administered 2012-02-14: 2 via ORAL
  Filled 2012-02-14: qty 2

## 2012-02-14 MED ORDER — OXYCODONE-ACETAMINOPHEN 5-325 MG PO TABS: 1.0000 | ORAL_TABLET | ORAL | Status: AC | PRN

## 2012-02-14 NOTE — ED Notes (Signed)
MD at bedside. 

## 2012-02-14 NOTE — Discharge Instructions (Signed)
Use heat on the sore area 3-4 times a day. Use the Resource Guide to find a PCP to see for ongoing care.   Back Exercises Back exercises help treat and prevent back injuries. The goal is to increase your strength in your belly (abdominal) and back muscles. These exercises can also help with flexibility. Start these exercises when told by your doctor. HOME CARE Back exercises include: Pelvic Tilt.  Lie on your back with your knees bent. Tilt your pelvis until the lower part of your back is against the floor. Hold this position 5 to 10 sec. Repeat this exercise 5 to 10 times.  Knee to Chest.  Pull 1 knee up against your chest and hold for 20 to 30 seconds. Repeat this with the other knee. This may be done with the other leg straight or bent, whichever feels better. Then, pull both knees up against your chest.  Sit-Ups or Curl-Ups.  Bend your knees 90 degrees. Start with tilting your pelvis, and do a partial, slow sit-up. Only lift your upper half 30 to 45 degrees off the floor. Take at least 2 to 3 seonds for each sit-up. Do not do sit-ups with your knees out straight. If partial sit-ups are difficult, simply do the above but with only tightening your belly (abdominal) muscles and holding it as told.  Hip-Lift.  Lie on your back with your knees flexed 90 degrees. Push down with your feet and shoulders as you raise your hips 2 inches off the floor. Hold for 10 seconds, repeat 5 to 10 times.  Back Arches.  Lie on your stomach. Prop yourself up on bent elbows. Slowly press on your hands, causing an arch in your low back. Repeat 3 to 5 times.  Shoulder-Lifts.  Lie face down with arms beside your body. Keep hips and belly pressed to floor as you slowly lift your head and shoulders off the floor.  Do not overdo your exercises. Be careful in the beginning. Exercises may cause you some mild back discomfort. If the pain lasts for more than 15 minutes, stop the exercises until you see your doctor.  Improvement with exercise for back problems is slow.  Document Released: 09/26/2010 Document Revised: 08/13/2011 Document Reviewed: 09/26/2010 [facility] Patient Information 2012 [place], [place].Sciatica Sciatica is a weakness and/or changes in sensation (tingling, jolts, hot and cold, numbness) along the path the sciatic nerve travels. Irritation or damage to lumbar nerve roots is often also referred to as lumbar radiculopathy.  Lumbar radiculopathy (Sciatica) is the most common form of this problem. Radiculopathy can occur in any of the nerves coming out of the spinal cord. The problems caused depend on which nerves are involved. The sciatic nerve is the large nerve supplying the branches of nerves going from the hip to the toes. It often causes a numbness or weakness in the skin and/or muscles that the sciatic nerve serves. It also may cause symptoms (problems) of pain, burning, tingling, or electric shock-like feelings in the path of this nerve. This usually comes from injury to the fibers that make up the sciatic nerve. Some of these symptoms are low back pain and/or unpleasant feelings in the following areas:  From the mid-buttock down the back of the leg to the back of the knee.   And/or the outside of the calf and top of the foot.   And/or behind the inner ankle to the sole of the foot.  CAUSES   Herniated or slipped disc. Discs are the little cushions  between the bones in the back.   Pressure by the piriformis muscle in the buttock on the sciatic nerve (Piriformis Syndrome).   Misalignment of the bones in the lower back and buttocks (Sacroiliac Joint Derangement).   Narrowing of the spinal canal that puts pressure on or pinches the fibers that make up the sciatic nerve.   A slipped vertebra that is out of line with those above or beneath it.   Abnormality of the nervous system itself so that nerve fibers do not transmit signals properly, especially to feet and calves (neuropathy).    Tumor (this is rare).  Your caregiver can usually determine the cause of your sciatica and begin the treatment most likely to help you. TREATMENT  Taking over-the-counter painkillers, physical therapy, rest, exercise, spinal manipulation, and injections of anesthetics and/or steroids may be used. Surgery, acupuncture, and Yoga can also be effective. Mind over matter techniques, mental imagery, and changing factors such as your bed, chair, desk height, posture, and activities are other treatments that may be helpful. You and your caregiver can help determine what is best for you. With proper diagnosis, the cause of most sciatica can be identified and removed. Communication and cooperation between your caregiver and you is essential. If you are not successful immediately, do not be discouraged. With time, a proper treatment can be found that will make you comfortable. HOME CARE INSTRUCTIONS   If the pain is coming from a problem in the back, applying ice to that area for 15 to 20 minutes, 3 to 4 times per day while awake, may be helpful. Put the ice in a plastic bag. Place a towel between the bag of ice and your skin.   You may exercise or perform your usual activities if these do not aggravate your pain, or as suggested by your caregiver.   Only take over-the-counter or prescription medicines for pain, discomfort, or fever as directed by your caregiver.   If your caregiver has given you a follow-up appointment, it is very important to keep that appointment. Not keeping the appointment could result in a chronic or permanent injury, pain, and disability. If there is any problem keeping the appointment, you must call back to this facility for assistance.  SEEK IMMEDIATE MEDICAL CARE IF:   You experience loss of control of bowel or bladder.   You have increasing weakness in the trunk, buttocks, or legs.   There is numbness in any areas from the hip down to the toes.   You have difficulty walking  or keeping your balance.   You have any of the above, with fever or forceful vomiting.  Document Released: 08/18/2001 Document Revised: 08/13/2011 Document Reviewed: 04/06/2008 [facility] Patient Information 2012 [place], [place].   RESOURCE GUIDE  Chronic Pain Problems: Contact [facility]  [phone] Patients need to be referred by their primary care doctor.  Insufficient Money for Medicine: Contact United Way:  call "211" or [facility] 319-113-7897.  No Primary Care Doctor: - Call Health Connect  [phone] - can help you locate a primary care doctor that  accepts your insurance, provides certain services, etc. - Physician Referral [phone]  Agencies that provide inexpensive medical care: - [facility]  [phone] - [facility]  [phone] - [facility]  [phone] - Women's Clinic  [phone] - Planned Parenthood  [phone] [facility]  [phone]  Medicaid-accepting [facility] Providers: - [facility]- [address]  [phone], Mon-Fri 9am-7pm, Sat 9am-1pm - [facility]- [address] [place], [place]  [phone] - [facility]- [address], Suite [place]  [phone] [facility]- [address]  [phone] - Renaye Rakers- [address] [place], [address], [phone]  Only accepts [place] patients after they have their name  applied to their card  Self Pay (no insurance) in [facility]: - Sickle Cell Patients: Dr Willey Blade, [facility]  [address] [place], [phone] - [facility]- [address] [place]  [phone]       [facility] [place]- [address]       -     [facility]- see information above (Speak to [employer] if you do not have insurance)       -  [facility]- [address] [place], [phone]        -  [facility]- [address],  [phone]       -  [facility]- [address], [phone]       -  Dr Julio Sicks-  [address], [place], [phone]       -  [facility]- [address], [phone]       -  [facility]- [address], [phone], also [address] [phone]       -    [facility]- [address] [place], [phone], 1st & 3rd Saturday   every month, 10am-1pm  1) Find a Doctor and Pay Out of Pocket Although you won't have to find out who is covered by your insurance plan, it is a good idea to ask around and get recommendations. You will then need to call the office and see if the doctor you have chosen will accept you as a new patient and what types of options they offer for patients who are self-pay. Some doctors offer discounts or will set up payment plans for their patients who do not have insurance, but you will need to ask so you aren't surprised when you get to your appointment.  2) Contact Your Local Health Department Not all health departments have doctors that can see patients for sick visits, but many do, so it is worth a call to see if yours does. If you don't know where your local health department is, you can check in your phone book. The CDC also has a tool to help you locate your state's health department, and many state websites also have listings of all of their local health departments.  3) Find a Walk-in Clinic If your illness is not likely to be very severe or complicated, you may want to try a walk in clinic. These are popping up all over the country in pharmacies, drugstores, and shopping centers. They're usually staffed by nurse practitioners or physician assistants that have been trained to treat common illnesses and complaints. They're usually fairly quick and inexpensive. However, if you have serious medical issues or chronic medical problems, these are probably not your  best option  STD Testing - [facility] Department of [facility] [place], STD Clinic, [address], [place], phone [phone] or [phone].  Monday - Friday, call for an appointment. [facility] Department of [employer],  STD Clinic, [address], [place], phone [phone] or [phone].  Monday - Friday, call for an appointment.  Abuse/Neglect: [facility] Child Abuse Hotline 351-451-3927 [facility] Child Abuse Hotline (236) 577-7551 (After Hours)  Emergency Shelter:  [facility] 417-534-8273  Maternity Homes: - [facility] at [facility] [phone] - [facility] [phone]  MRSA Hotline #:   949-529-8463  [facility] Resources  [facility]  United Way [facility] Dept. [address]                 [address]         [address] [place] [address]  Blondell Reveal Phone:  [phone]                                  Phone:  [phone]                   Phone:  [phone]  [facility] Mental Health, [phone] - [facility] - [phone]       -     [facility] in [place], [address]                                  [phone], [facility] Child Abuse Hotline 574-614-9862 or 704-136-8398 (After Hours)   Behavioral Health Services  Substance Abuse Resources: - [facility] Drug Services  707-775-0612 - [facility] [phone] - [facility] [place] [phone] Floydene Flock [phone] - Residential & Outpatient Substance Abuse Program  ([phone]  Psychological Services: [facility]  ([phone] Services  [phone] - [facility], [phone] [place]. [address], [place], ACCESS LINE: 6696139615 or  234-117-5611, [employer]  Dental Assistance  If unable to pay or uninsured, contact:  [facility] or [facility] to become qualified for the adult dental clinic.  Patients with Medicaid: [facility] [phone] [address], [phone] [address] [phone]  If unable to pay, or uninsured, contact [facility] (989)101-3660) or [facility] Department [phone] in [place], [phone] in [facility]) to become qualified for the adult dental clinic  Other Low-Cost Community Dental Services: - [facility]- [address] [place], [place], [place], [address], [phone], [address]. [phone], 2nd and 4th Thursday of the month at 6:30am.  10 clients each day by appointment, can sometimes see walk-in patients if someone does not show for an appointment. [facility]- [address] [place], [place], [address], [phone] - [facility]- [address], [place], [place], [address], [phone] - [facility]- [phone] [facility]- [phone] [facility] Department- [phone]

## 2012-02-14 NOTE — ED Notes (Addendum)
When this RN entered slot, pt was bending adjusting the back of the bed.  Pt states she had back surgery 11 years ago.  C/O pain for 3 weeks.  Pt denies any falls/trauma.

## 2012-02-14 NOTE — ED Notes (Signed)
Pt talking on phone and in NAD.

## 2012-02-14 NOTE — ED Provider Notes (Signed)
History   This chart was scribed for Flint Melter, MD scribed by Magnus Sinning. The patient was seen in room STRE1/STRE1 seen at 11:15    CSN: [id number]  Arrival date & time 02/14/12  0945   None     Chief Complaint  Patient presents with  . Back Pain  . Hip Pain  . Leg Pain    (Consider location/radiation/quality/duration/timing/severity/associated sxs/prior treatment) HPI [name] is a 46 y.o. female who presents to the Emergency Department complaining of gradually worsening moderate lower back pain with associate right leg and hip pain, onset 3 weeks, but worsening yesterday. States complaints located in lower back pain radiating up and down on the right.  Was seen last weekend and was given percocet to take for her back pain. Patient also currently takes 1 tablet of prednisone daily. States that the last time that she had a percocet was 4 days ago.Patient has hx of back surgery 11 years ago. Patient states she has not taken anything for the pain and denies any associated sxs. Nephrologist :Dr. Arrie Aran Past Medical History  Diagnosis Date  . Diabetes mellitus   . Hypertension   . Hypothyroid   . Depression   . 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 Kidney disease   . End stage renal disease     right arm AV graft, post transplant    Past Surgical History  Procedure Date  . Back surgery 2001  . Tubal ligation   . Dg av dialysis graft declot or   . Nephrectomy transplanted organ   . Kidney transplant     Family History  Problem Relation Age of Onset  . Emphysema Mother   . Throat cancer Mother   . COPD Mother   . Cancer Mother   . Emphysema Father   . COPD Father   . Stroke Father     History  Substance Use Topics  . Smoking status: Former Smoker -- 1.0 packs/day for 11 years    Types: Cigarettes    Quit date: 09/21/1998  . Smokeless tobacco: Never Used  . Alcohol Use: No     Review of Systems  Musculoskeletal: Positive  for back pain.  All other systems reviewed and are negative.   Allergies  Ace inhibitors and Adhesive  Home Medications   Current Outpatient Rx  Name Route Sig Dispense Refill  . ACETAMINOPHEN 500 MG PO TABS Oral Take 500 mg by mouth every 6 (six) hours as needed. For pain    . AMLODIPINE BESYLATE 5 MG PO TABS Oral Take 5 mg by mouth daily.      [name] CARVEDILOL 25 MG PO TABS Oral Take 50 mg by mouth 2 (two) times daily.     . INSULIN ASPART 100 UNIT/ML [place] SOLN Subcutaneous Inject 0-11 Units into the skin 3 (three) times daily with meals. Sliding scale:  <70 0 units, drink orange juice; 70-120 0 units; 121-150 1 unit; 151-200 2 units; 201-250 3 units; 251-300 5 units; 301-350 7 units; 351-400 9 units; >400 11 units & call MD    . INSULIN ASPART PROT & ASPART (70-30) 100 UNIT/ML [place] SUSP Subcutaneous Inject 25 Units into the skin 2 (two) times daily with a meal. 10 mL 1  . LEVOTHYROXINE SODIUM 137 MCG PO TABS Oral Take 137 mcg by mouth daily. Name brand    . MYCOPHENOLATE MOFETIL 250 MG PO CAPS Oral Take 500 mg by mouth 2 (two) times daily.     [name]  PREDNISONE 5 MG PO TABS Oral Take 5 mg by mouth daily.      [name] RANITIDINE HCL 150 MG PO CAPS Oral Take 150 mg by mouth at bedtime.     [name] TACROLIMUS 1 MG PO CAPS Oral Take 1 mg by mouth 2 (two) times daily.     . OXYCODONE-ACETAMINOPHEN 5-325 MG PO TABS Oral Take 1 tablet by mouth every 4 (four) hours as needed for pain. 15 tablet 0    BP 143/94  Pulse 73  Temp(Src) 97.9 F (36.6 C) (Oral)  Resp 20  SpO2 98%  LMP 11/19/2011  Physical Exam  Nursing note and vitals reviewed. Constitutional: She is oriented to person, place, and time. She appears well-developed and well-nourished. No distress.  HENT:  Head: Normocephalic and atraumatic.  Eyes: Conjunctivae and EOM are normal.  Neck: Neck supple. No tracheal deviation present.  Cardiovascular: Normal rate.   Pulmonary/Chest: Effort normal. No respiratory distress.  Abdominal: She exhibits no  distension.  Musculoskeletal: Normal range of motion. She exhibits tenderness. She exhibits no edema.       Negative straight leg raising, Bilateral paravertebral lumbar tenderness   Neurological: She is alert and oriented to person, place, and time. No sensory deficit.  Skin: Skin is warm and dry.  Psychiatric: She has a normal mood and affect. Her behavior is normal.    ED Course  Procedures (including critical care time) DIAGNOSTIC STUDIES: Oxygen Saturation is 98% on room air, normal by my interpretation.    COORDINATION OF CARE:    Labs Reviewed - No data to display No results found.   1. Sciatica       MDM  Sciatica, without suspected spinal stenosis, disc rupture or spinal myelopathy.   I personally performed the services described in this documentation, which was scribed in my presence. The recorded information has been reviewed and considered.    Plan: Home Medications- Percocet; Home Treatments- heat treatments; Recommended follow up- PCP prn       Flint Melter, MD 02/14/12 1840

## 2012-02-14 NOTE — ED Notes (Signed)
Pt reports back pain radiating to right hip and leg x 3 weeks. Pt tearful in triage.

## 2012-03-09 ENCOUNTER — Other Ambulatory Visit: Payer: Self-pay | Admitting: Neurosurgery

## 2012-03-09 DIAGNOSIS — M5106 Intervertebral disc disorders with myelopathy, lumbar region: Secondary | ICD-10-CM

## 2012-03-09 DIAGNOSIS — IMO0002 Reserved for concepts with insufficient information to code with codable children: Secondary | ICD-10-CM

## 2012-03-25 ENCOUNTER — Inpatient Hospital Stay: Admission: RE | Admit: 2012-03-25 | Payer: Medicare Other | Source: Ambulatory Visit

## 2012-05-02 IMAGING — CR DG FOOT COMPLETE 3+V*R*
3 series · 3 of 3 positions shown · non-contrast
Comparison: 02/03/2005

CLINICAL DATA: Right lateral foot pain

RIGHT FOOT COMPLETE - 3+ VIEW

[t foot ap right]
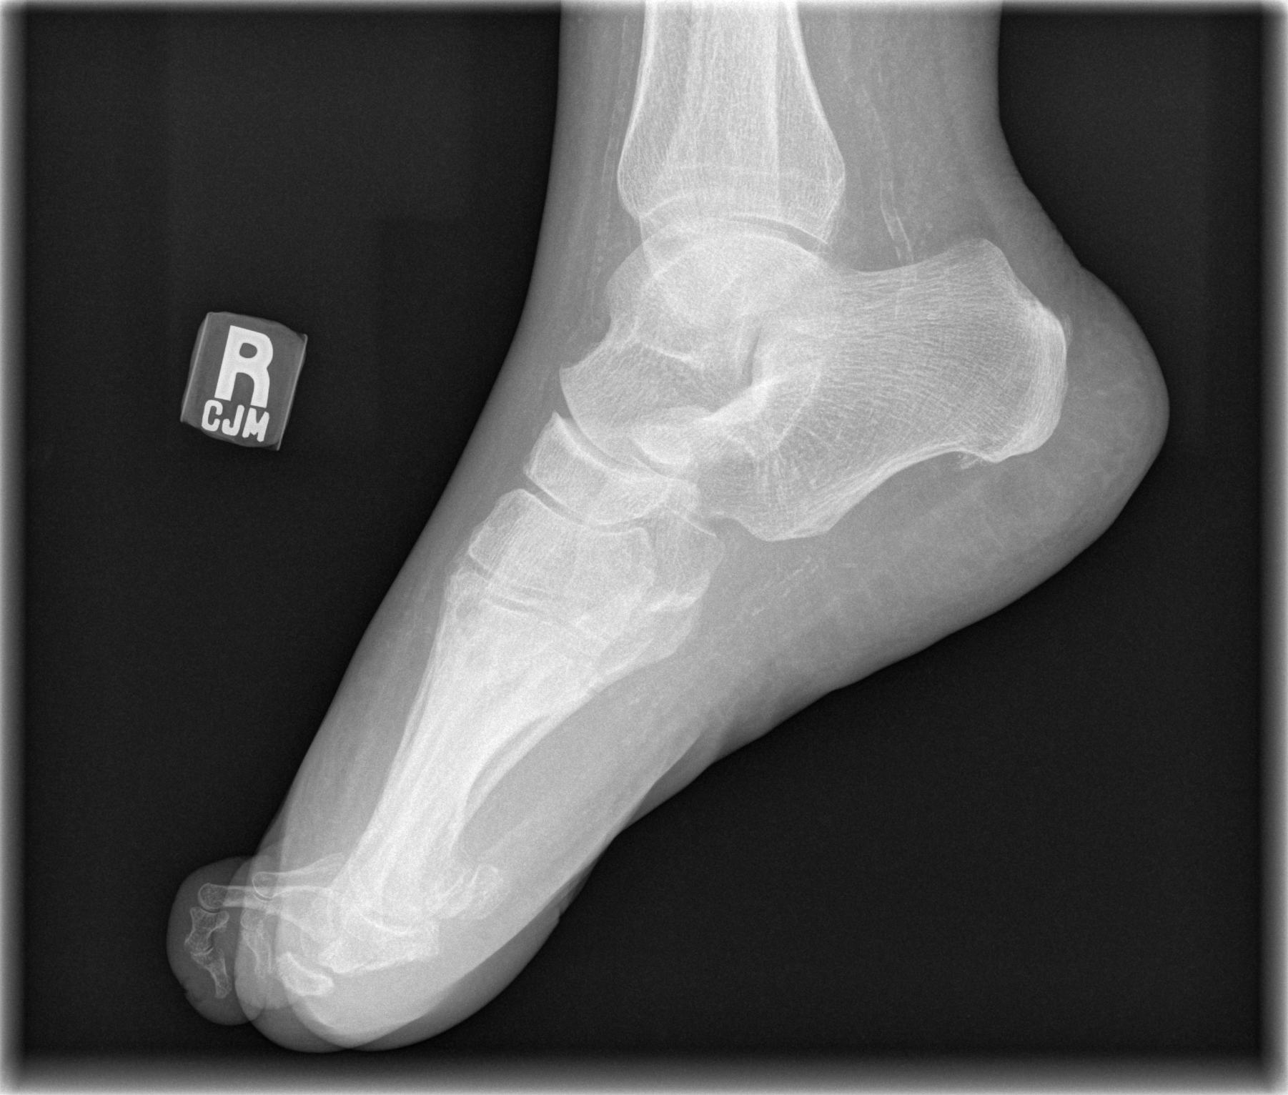

[t foot oblique right]
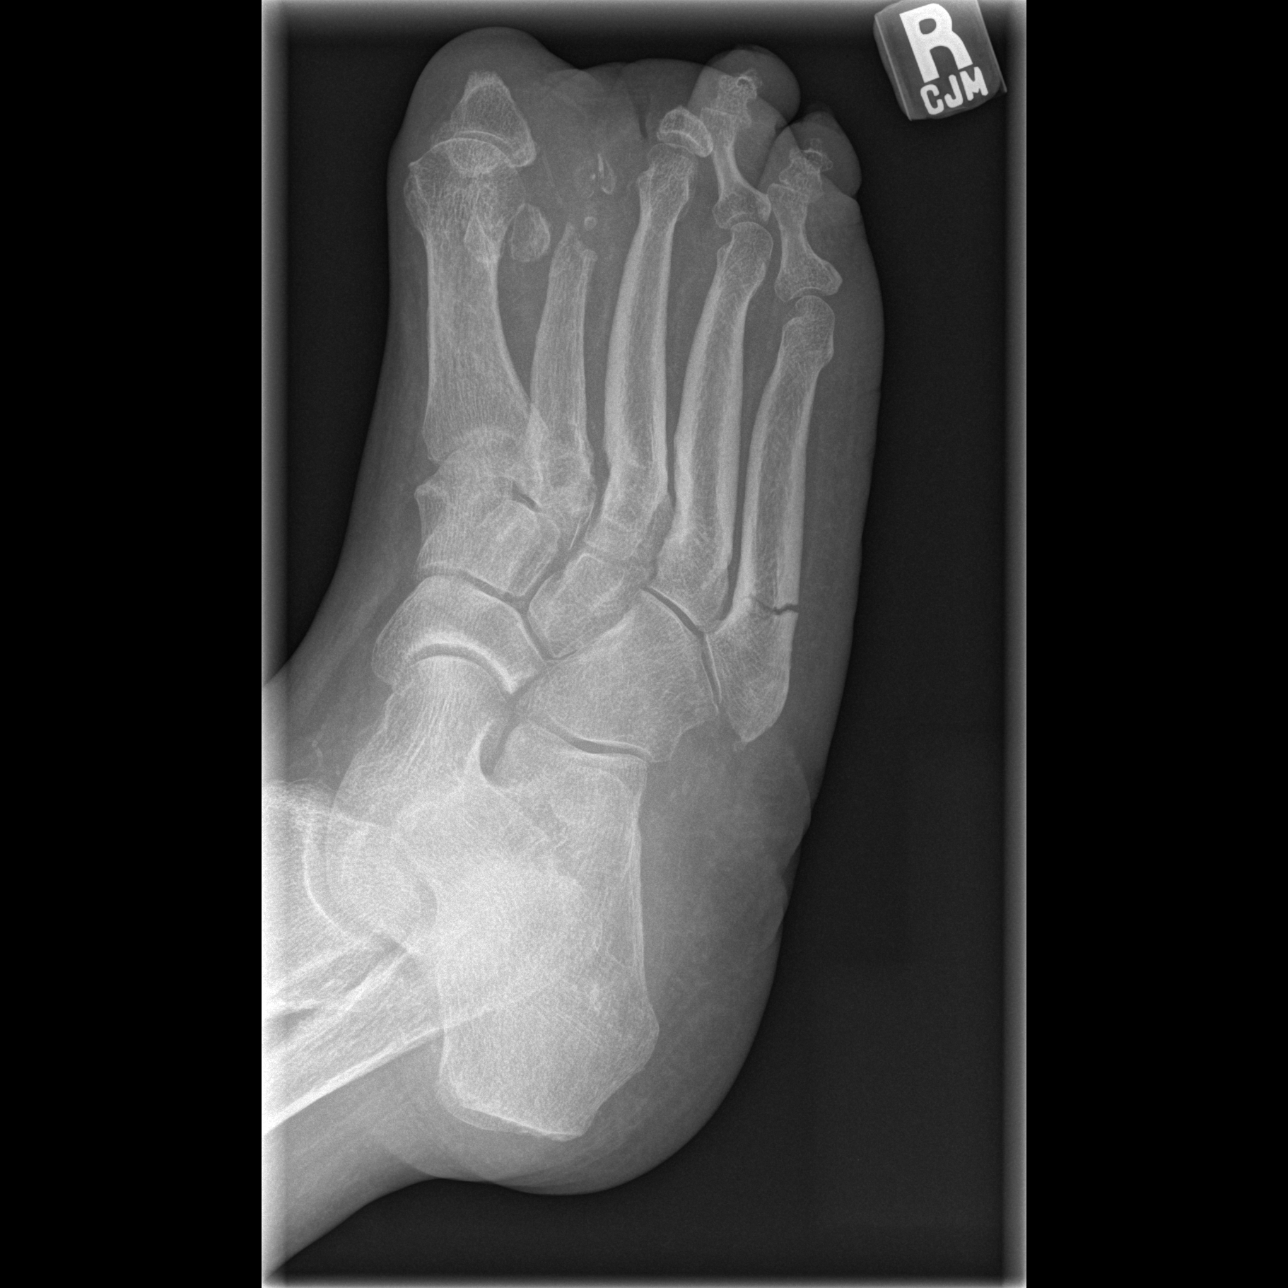

[t foot lat right]
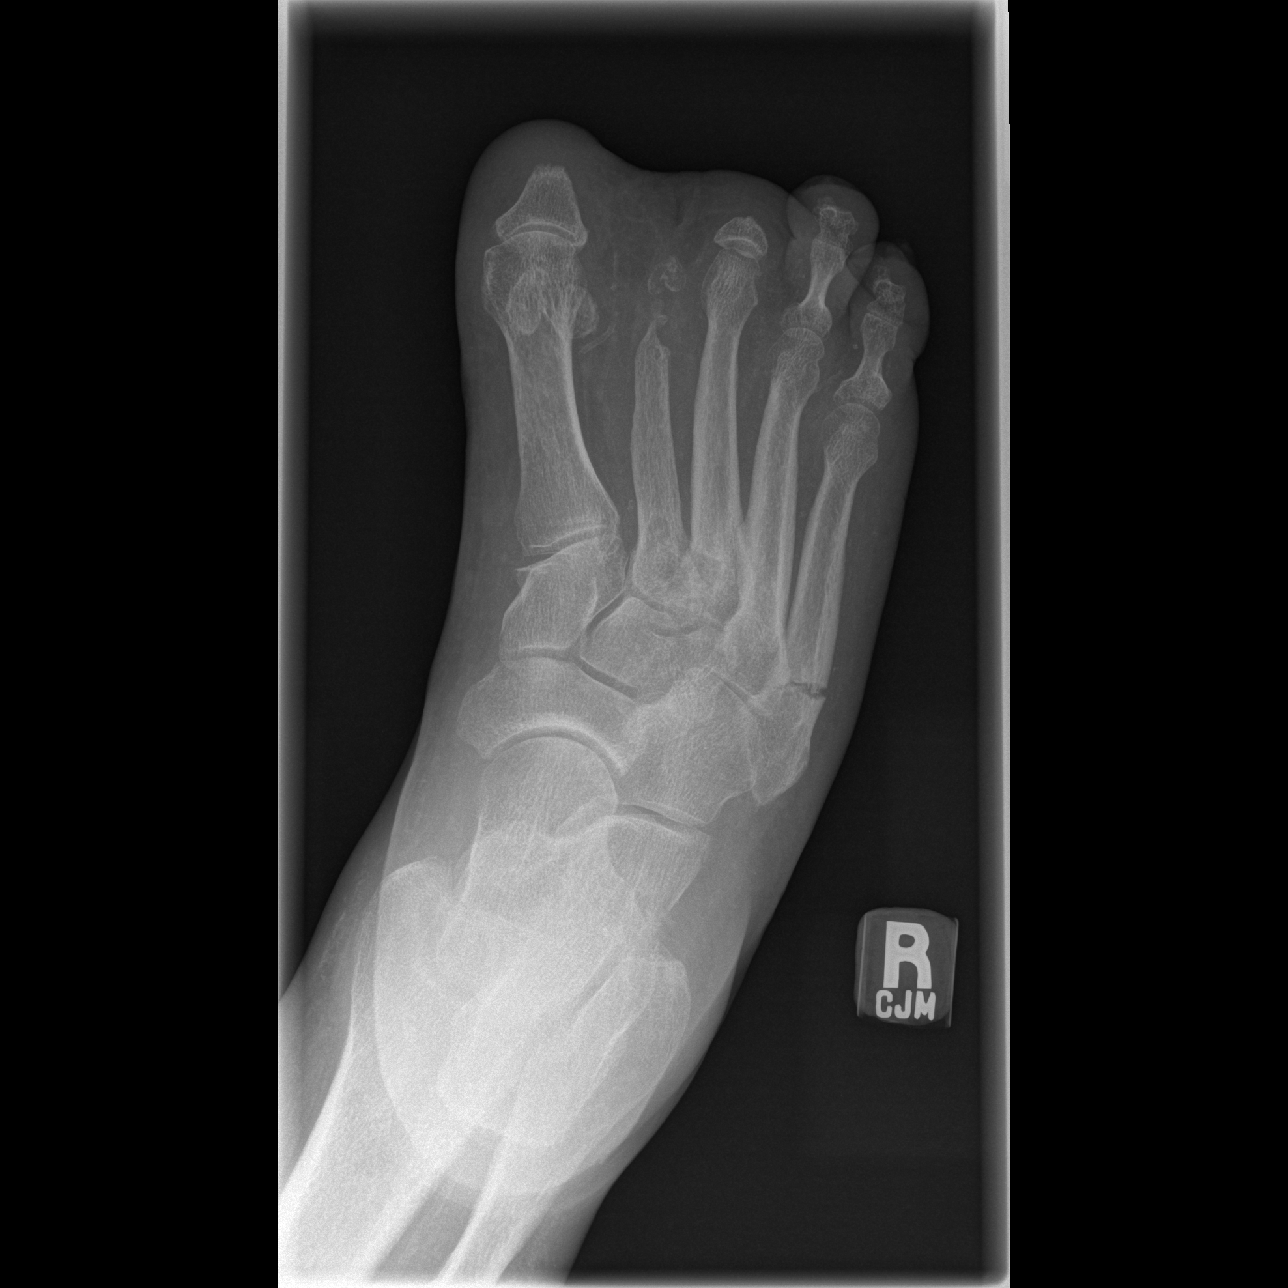

[3 of 3 positions shown; findings below may reference images not displayed]

FINDINGS: There is a transverse fracture of the base of the fifth
metatarsal with overlying soft tissue swelling.  No appreciable
callus formation is noted.  There is apparent evidence of partial
amputation of the first through third toes.  Mild irregularity at
the distal resection margin at the great toe and second toe noted.
No radiopaque foreign body.  There is mild irregularity at the base
of the second metatarsal but no definite fracture line is seen.
IMPRESSION: Acute fracture through the base of the right fifth metatarsal.

Irregularity at the distal resection margins of the first and
second toes partial amputation sites.  This may be related to
postsurgical change but osteomyelitis could have a similar
appearance.

Irregularity of the base of the second metatarsal without definite
fracture or dislocation evident.  If symptoms continue, consider
follow-up radiographs or MRI.

## 2012-05-02 IMAGING — CR DG ANKLE COMPLETE 3+V*R*
3 series · 3 of 3 positions shown · non-contrast
Comparison: None.

CLINICAL DATA: Right lateral ankle pain

RIGHT ANKLE - COMPLETE 3+ VIEW

[t ankle joint ap right]
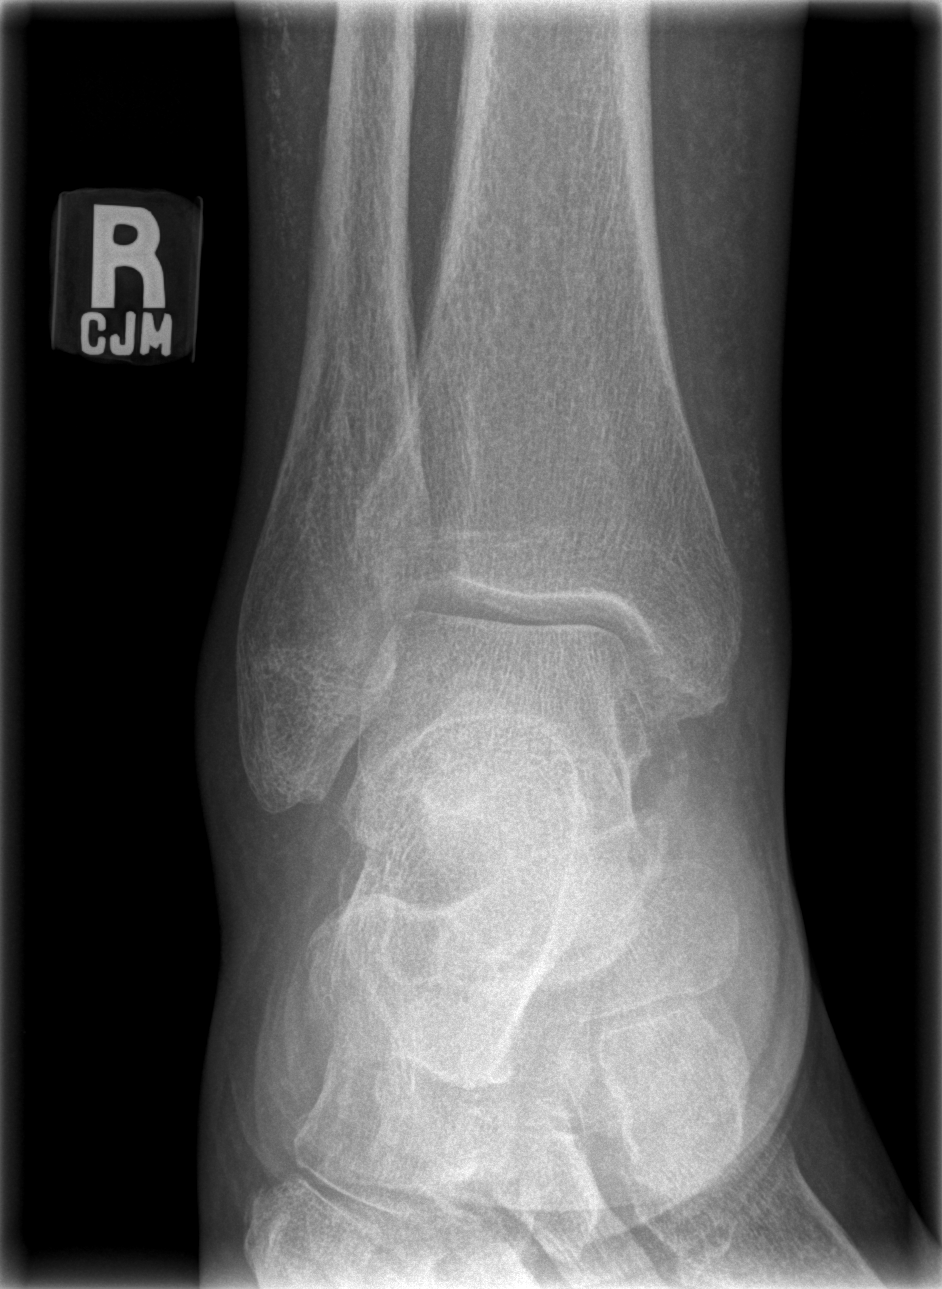

[t ankle joint oblique right]
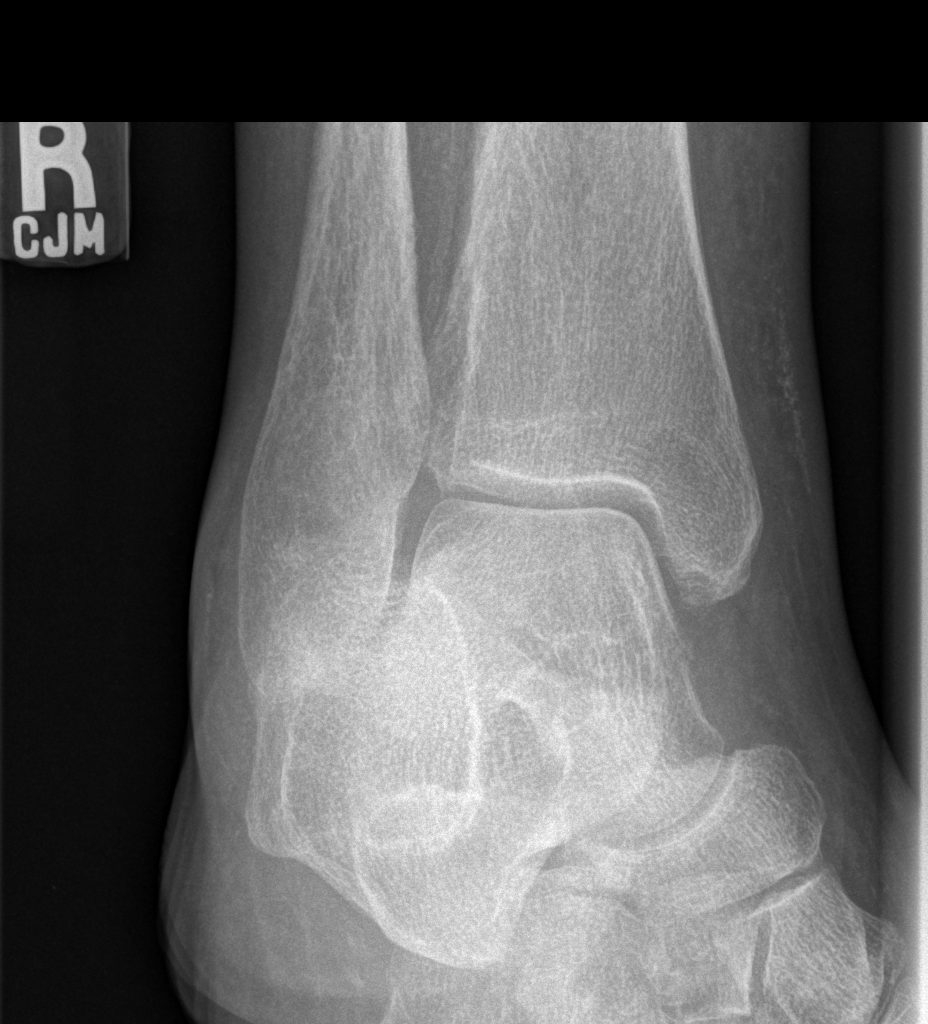

[t ankle joint lat right]
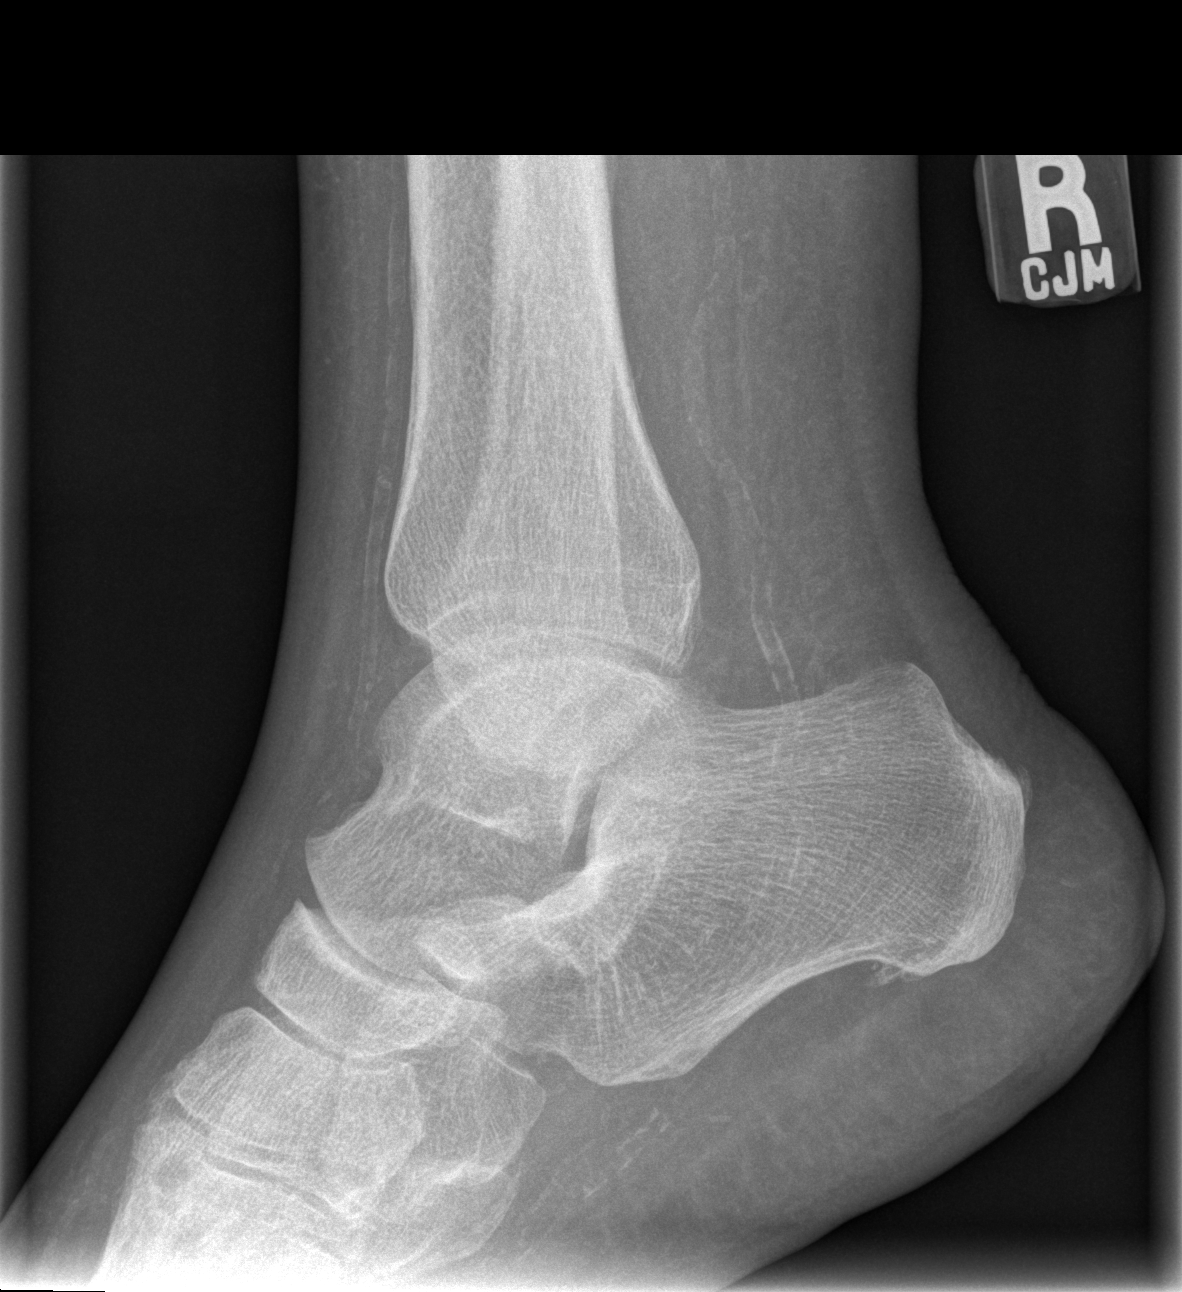

[3 of 3 positions shown; findings below may reference images not displayed]

FINDINGS: No fracture or dislocation.  No soft tissue abnormality.
No radiopaque foreign body.  Vascular calcifications noted.
Plantar calcaneal spurring is present.
IMPRESSION: No acute abnormality of the ankle.  Please see report of foot
findings dictated separately.

## 2012-05-13 ENCOUNTER — Inpatient Hospital Stay: Admission: RE | Admit: 2012-05-13 | Payer: Medicare Other | Source: Ambulatory Visit

## 2012-07-04 ENCOUNTER — Emergency Department (HOSPITAL_COMMUNITY)
Admission: EM | Admit: 2012-07-04 | Discharge: 2012-07-04 | Disposition: A | Discharge status: Home / Self Care | Payer: Medicare Other | Attending: Emergency Medicine | Admitting: Emergency Medicine

## 2012-07-04 ENCOUNTER — Encounter (HOSPITAL_COMMUNITY): Payer: Self-pay | Admitting: *Deleted

## 2012-07-04 DIAGNOSIS — Z940 Kidney transplant status: Secondary | ICD-10-CM | POA: Insufficient documentation

## 2012-07-04 DIAGNOSIS — F3289 Other specified depressive episodes: Secondary | ICD-10-CM | POA: Insufficient documentation

## 2012-07-04 DIAGNOSIS — E039 Hypothyroidism, unspecified: Secondary | ICD-10-CM | POA: Insufficient documentation

## 2012-07-04 DIAGNOSIS — I120 Hypertensive chronic kidney disease with stage 5 chronic kidney disease or end stage renal disease: Secondary | ICD-10-CM | POA: Insufficient documentation

## 2012-07-04 DIAGNOSIS — E119 Type 2 diabetes mellitus without complications: Secondary | ICD-10-CM | POA: Insufficient documentation

## 2012-07-04 DIAGNOSIS — R739 Hyperglycemia, unspecified: Secondary | ICD-10-CM

## 2012-07-04 DIAGNOSIS — N186 End stage renal disease: Secondary | ICD-10-CM | POA: Insufficient documentation

## 2012-07-04 DIAGNOSIS — Z87891 Personal history of nicotine dependence: Secondary | ICD-10-CM | POA: Insufficient documentation

## 2012-07-04 DIAGNOSIS — F329 Major depressive disorder, single episode, unspecified: Secondary | ICD-10-CM | POA: Insufficient documentation

## 2012-07-04 DIAGNOSIS — IMO0002 Reserved for concepts with insufficient information to code with codable children: Secondary | ICD-10-CM | POA: Insufficient documentation

## 2012-07-04 DIAGNOSIS — I10 Essential (primary) hypertension: Secondary | ICD-10-CM | POA: Insufficient documentation

## 2012-07-04 DIAGNOSIS — Z794 Long term (current) use of insulin: Secondary | ICD-10-CM | POA: Insufficient documentation

## 2012-07-04 DIAGNOSIS — Z79899 Other long term (current) drug therapy: Secondary | ICD-10-CM | POA: Insufficient documentation

## 2012-07-04 DIAGNOSIS — E1169 Type 2 diabetes mellitus with other specified complication: Secondary | ICD-10-CM | POA: Insufficient documentation

## 2012-07-04 LAB — CBC WITH DIFFERENTIAL/PLATELET
Basophils Absolute: 0 10*3/uL (ref 0.0–0.1)
Basophils Relative: 0 % (ref 0–1)
Eosinophils Absolute: 0.1 10*3/uL (ref 0.0–0.7)
Eosinophils Relative: 2 % (ref 0–5)
Lymphs Abs: 0.9 10*3/uL (ref 0.7–4.0)
MCH: 29.2 pg (ref 26.0–34.0)
MCHC: 33.1 g/dL (ref 30.0–36.0)
MCV: 88.2 fL (ref 78.0–100.0)
Platelets: 170 10*3/uL (ref 150–400)
RDW: 12.8 % (ref 11.5–15.5)

## 2012-07-04 LAB — BASIC METABOLIC PANEL
CO2: 25 mEq/L (ref 19–32)
Calcium: 10 mg/dL (ref 8.4–10.5)
Creatinine, Ser: 0.97 mg/dL (ref 0.50–1.10)

## 2012-07-04 LAB — COMPREHENSIVE METABOLIC PANEL
ALT: 14 U/L (ref 0–35)
Calcium: 10.8 mg/dL — ABNORMAL HIGH (ref 8.4–10.5)
GFR calc Af Amer: 62 mL/min — ABNORMAL LOW (ref >90)
Glucose, Bld: 539 mg/dL — ABNORMAL HIGH (ref 70–99)
Sodium: 130 mEq/L — ABNORMAL LOW (ref 135–145)
Total Protein: 7.8 g/dL (ref 6.0–8.3)

## 2012-07-04 LAB — URINE MICROSCOPIC-ADD ON

## 2012-07-04 LAB — URINALYSIS, ROUTINE W REFLEX MICROSCOPIC
Bilirubin Urine: NEGATIVE
Leukocytes, UA: NEGATIVE
Nitrite: NEGATIVE
Specific Gravity, Urine: 1.024 (ref 1.005–1.030)
pH: 6.5 (ref 5.0–8.0)

## 2012-07-04 LAB — GLUCOSE, CAPILLARY
Glucose-Capillary: 494 mg/dL — ABNORMAL HIGH (ref 70–99)
Glucose-Capillary: 204 mg/dL — ABNORMAL HIGH (ref 70–99)
Glucose-Capillary: 144 mg/dL — ABNORMAL HIGH (ref 70–99)

## 2012-07-04 LAB — POCT I-STAT 3, VENOUS BLOOD GAS (G3P V)
O2 Saturation: 77 %
TCO2: 26 mmol/L (ref 0–100)
pH, Ven: 7.359 — ABNORMAL HIGH (ref 7.250–7.300)

## 2012-07-04 MED ORDER — SODIUM CHLORIDE 0.9 % IV SOLN
INTRAVENOUS | Status: DC
  Administered 2012-07-04: 10:00:00 via INTRAVENOUS

## 2012-07-04 MED ORDER — ACETAMINOPHEN 325 MG PO TABS
650.0000 mg | ORAL_TABLET | Freq: Once | ORAL | Status: AC
  Administered 2012-07-04: 650 mg via ORAL
  Filled 2012-07-04: qty 2

## 2012-07-04 MED ORDER — DEXTROSE 50 % IV SOLN: 25.0000 mL | INTRAVENOUS | Status: DC | PRN

## 2012-07-04 MED ORDER — SODIUM CHLORIDE 0.9 % IV SOLN
INTRAVENOUS | Status: DC
  Administered 2012-07-04: 4.1 [IU]/h via INTRAVENOUS
  Filled 2012-07-04: qty 1

## 2012-07-04 MED ORDER — SODIUM CHLORIDE 0.9 % IV BOLUS (SEPSIS)
1000.0000 mL | Freq: Once | INTRAVENOUS | Status: AC
  Administered 2012-07-04: 1000 mL via INTRAVENOUS

## 2012-07-04 MED ORDER — DEXTROSE-NACL 5-0.45 % IV SOLN: INTRAVENOUS | Status: DC

## 2012-07-04 MED ORDER — INSULIN REGULAR BOLUS VIA INFUSION
0.0000 [IU] | Freq: Three times a day (TID) | INTRAVENOUS | Status: DC
  Filled 2012-07-04: qty 10

## 2012-07-04 NOTE — ED Notes (Signed)
Pharmacy called for Insulin Drip

## 2012-07-04 NOTE — ED Provider Notes (Signed)
Medical screening examination/treatment/procedure(s) were performed by non-physician practitioner and as supervising physician I was immediately available for consultation/collaboration.  Traves Majchrzak, MD 07/04/12 1519 

## 2012-07-04 NOTE — ED Notes (Signed)
Per Glucostabalizer infusion rate  From 7.2units/ hour reduced to 4.9units/hour

## 2012-07-04 NOTE — ED Provider Notes (Signed)
1:31 PM  Pt seen and examined by me in CDU. Pt with elevated blood sugar for the last 3 days, states in 600s. States generalized malaise, unable to eat or sleep well. Pt was evaluated on main side, she was found to have a CBG of 539. Her anion gap is 12, and she does not appear to be in DKA. She was placed in CDU on hyperglycemia protocol. Fluids and glucose stabilizer initiated.  VS normal. Will monitor here.   Exam: Pt in NAD. AAOx3. PERRLA. Neck is supple. Regular HR and rhythm. Lungs are clear to auscultation bilaterally. Abdomen soft, non tender.  Normal coordination. Normal gait. Neurovascularly intact.   Results for orders placed during the hospital encounter of 07/04/12  GLUCOSE, CAPILLARY      Component Value Range   Glucose-Capillary 470 (*) 70 - 99 mg/dL  CBC WITH DIFFERENTIAL      Component Value Range   WBC 5.6  4.0 - 10.5 K/uL   RBC 5.07  3.87 - 5.11 MIL/uL   Hemoglobin 14.8  12.0 - 15.0 g/dL   HCT 78.2  95.6 - 21.3 %   MCV 88.2  78.0 - 100.0 fL   MCH 29.2  26.0 - 34.0 pg   MCHC 33.1  30.0 - 36.0 g/dL   RDW 08.6  57.8 - 46.9 %   Platelets 170  150 - 400 K/uL   Neutrophils Relative 72  43 - 77 %   Neutro Abs 4.0  1.7 - 7.7 K/uL   Lymphocytes Relative 17  12 - 46 %   Lymphs Abs 0.9  0.7 - 4.0 K/uL   Monocytes Relative 10  3 - 12 %   Monocytes Absolute 0.6  0.1 - 1.0 K/uL   Eosinophils Relative 2  0 - 5 %   Eosinophils Absolute 0.1  0.0 - 0.7 K/uL   Basophils Relative 0  0 - 1 %   Basophils Absolute 0.0  0.0 - 0.1 K/uL  COMPREHENSIVE METABOLIC PANEL      Component Value Range   Sodium 130 (*) 135 - 145 mEq/L   Potassium 4.6  3.5 - 5.1 mEq/L   Chloride 94 (*) 96 - 112 mEq/L   CO2 24  19 - 32 mEq/L   Glucose, Bld 539 (*) 70 - 99 mg/dL   BUN 26 (*) 6 - 23 mg/dL   Creatinine, Ser 6.29 (*) 0.50 - 1.10 mg/dL   Calcium 52.8 (*) 8.4 - 10.5 mg/dL   Total Protein 7.8  6.0 - 8.3 g/dL   Albumin 3.7  3.5 - 5.2 g/dL   AST 13  0 - 37 U/L   ALT 14  0 - 35 U/L   Alkaline  Phosphatase 140 (*) 39 - 117 U/L   Total Bilirubin 0.7  0.3 - 1.2 mg/dL   GFR calc non Af Amer 53 (*) >90 mL/min   GFR calc Af Amer 62 (*) >90 mL/min  URINALYSIS, ROUTINE W REFLEX MICROSCOPIC      Component Value Range   Color, Urine YELLOW  YELLOW   APPearance CLEAR  CLEAR   Specific Gravity, Urine 1.024  1.005 - 1.030   pH 6.5  5.0 - 8.0   Glucose, UA >1000 (*) NEGATIVE mg/dL   Hgb urine dipstick NEGATIVE  NEGATIVE   Bilirubin Urine NEGATIVE  NEGATIVE   Ketones, ur 15 (*) NEGATIVE mg/dL   Protein, ur NEGATIVE  NEGATIVE mg/dL   Urobilinogen, UA 0.2  0.0 - 1.0 mg/dL   Nitrite  NEGATIVE  NEGATIVE   Leukocytes, UA NEGATIVE  NEGATIVE  URINE MICROSCOPIC-ADD ON      Component Value Range   Squamous Epithelial / LPF RARE  RARE   WBC, UA 0-2  <3 WBC/hpf   RBC / HPF 0-2  <3 RBC/hpf   Bacteria, UA RARE  RARE   Urine-Other RARE YEAST    PREGNANCY, URINE      Component Value Range   Preg Test, Ur NEGATIVE  NEGATIVE  GLUCOSE, CAPILLARY      Component Value Range   Glucose-Capillary 494 (*) 70 - 99 mg/dL  GLUCOSE, CAPILLARY      Component Value Range   Glucose-Capillary 474 (*) 70 - 99 mg/dL  GLUCOSE, CAPILLARY      Component Value Range   Glucose-Capillary 471 (*) 70 - 99 mg/dL  GLUCOSE, CAPILLARY      Component Value Range   Glucose-Capillary 418 (*) 70 - 99 mg/dL   Comment 1 Notify RN     Comment 2 Documented in Chart    GLUCOSE, CAPILLARY      Component Value Range   Glucose-Capillary 306 (*) 70 - 99 mg/dL   Comment 1 Notify RN     Comment 2 Documented in Chart    GLUCOSE, CAPILLARY      Component Value Range   Glucose-Capillary 204 (*) 70 - 99 mg/dL  BASIC METABOLIC PANEL      Component Value Range   Sodium 137  135 - 145 mEq/L   Potassium 4.1  3.5 - 5.1 mEq/L   Chloride 104  96 - 112 mEq/L   CO2 25  19 - 32 mEq/L   Glucose, Bld 191 (*) 70 - 99 mg/dL   BUN 21  6 - 23 mg/dL   Creatinine, Ser 1.61  0.50 - 1.10 mg/dL   Calcium 09.6  8.4 - 04.5 mg/dL   GFR calc non Af  Amer 69 (*) >90 mL/min   GFR calc Af Amer 81 (*) >90 mL/min  POCT I-STAT 3, BLOOD GAS (G3P V)      Component Value Range   pH, Ven 7.359 (*) 7.250 - 7.300   pCO2, Ven 43.9 (*) 45.0 - 50.0 mmHg   pO2, Ven 43.0  30.0 - 45.0 mmHg   Bicarbonate 24.7 (*) 20.0 - 24.0 mEq/L   TCO2 26  0 - 100 mmol/L   O2 Saturation 77.0     Acid-base deficit 1.0  0.0 - 2.0 mmol/L   Sample type VENOUS    GLUCOSE, CAPILLARY      Component Value Range   Glucose-Capillary 144 (*) 70 - 99 mg/dL    4:09 PM Blood sugar down to 144. BMET checked, normal electrolytes. Pt non toxic. Eating with no problems. VS normal.   Filed Vitals:   07/04/12 1401  BP: 135/69  Pulse: 76  Temp: 98 F (36.7 C)  Resp: 18   Will d/c home with close follow up for better blood sugar control.       Lottie Mussel, PA 07/04/12 1525

## 2012-07-04 NOTE — ED Notes (Signed)
Pt with recent hx of admit to [facility] for ketoacidosis to ED for high blood sugars since Sat.  Her bs have been running 400's to "over 600's".  Pt c/o polyuria since 12 am.  VS wnl.

## 2012-07-04 NOTE — ED Notes (Signed)
Pharmacy called by Pharmacy tech for insulin

## 2012-07-04 NOTE — ED Notes (Addendum)
Pt. CBG 418 per protocol Insulin regular rate changed to 7.43mL/hr. Pt. C/o of lethargy, polydipsia, polyphagia, and dry mouth. Bruit audible and right forearm graft site.

## 2012-07-04 NOTE — ED Notes (Signed)
CBG checked 470

## 2012-07-04 NOTE — ED Provider Notes (Signed)
History     CSN: [id number]  Arrival date & time 07/04/12  4098   First MD Initiated Contact with Patient 07/04/12 [phone]      Chief Complaint  Patient presents with  . Hyperglycemia    (Consider location/radiation/quality/duration/timing/severity/associated sxs/prior treatment) The history is provided by the patient, the spouse and medical records.   [name] is a 46 y.o. female presents with hyperglycemia.  PMHx IDDM.  She takes Insulin 30U Lantus at PM and 40U in the AM.  Pt does not have PCP.  She sees Fredric Dine at [facility] who manages her insulin.  He was last seen 1 mo ago and that was when she began to have more trouble managing her sugars.  She states that her sugar goes up through the day into the 600's and its lowest in the 400's.  Pt states polydipsia, polyphagia, polyuria and frequency. The symptoms began gradually 1 day ago and have been progressively getting worse.  The symptoms are persistent.  She has associated fatigue, abdominal pain, headache, body aches.  Pt denies chest pain, shortness of breath, nausea, vomiting, diarrhea, changes in vision, weakness, numbness.  PMHx of kidney transplant April 2011.    Past Medical History  Diagnosis Date  . Hypertension   . Hypothyroid   . Depression   . 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 Kidney disease   . End stage renal disease     right arm AV graft, post transplant  . Diabetes mellitus     Insulin dependant    Past Surgical History  Procedure Date  . Back surgery 2001  . Tubal ligation   . Dg av dialysis graft declot or   . Nephrectomy transplanted organ   . Kidney transplant     Family History  Problem Relation Age of Onset  . Emphysema Mother   . Throat cancer Mother   . COPD Mother   . Cancer Mother   . Emphysema Father   . COPD Father   . Stroke Father     History  Substance Use Topics  . Smoking status: Former Smoker -- 1.0 packs/day for 11 years   Types: Cigarettes    Quit date: 09/21/1998  . Smokeless tobacco: Never Used  . Alcohol Use: No    OB History    Grav Para Term Preterm Abortions TAB SAB Ect Mult Living                  Review of Systems  Constitutional: Negative for fever, diaphoresis, appetite change, fatigue and unexpected weight change.  HENT: Negative for mouth sores and neck stiffness.   Eyes: Negative for visual disturbance.  Respiratory: Negative for cough, chest tightness, shortness of breath and wheezing.   Cardiovascular: Negative for chest pain.  Gastrointestinal: Positive for abdominal pain. Negative for nausea, vomiting, diarrhea and constipation.  Genitourinary: Positive for urgency and frequency. Negative for dysuria, hematuria and decreased urine volume.  Musculoskeletal: Negative for back pain.  Skin: Negative for rash.  Neurological: Positive for headaches. Negative for syncope and light-headedness.  Psychiatric/Behavioral: Negative for disturbed wake/sleep cycle. The patient is not nervous/anxious.   All other systems reviewed and are negative.    Allergies  Ace inhibitors and Adhesive  Home Medications   Current Outpatient Rx  Name Route Sig Dispense Refill  . AMLODIPINE BESYLATE 5 MG PO TABS Oral Take 5 mg by mouth daily.      [name] CARVEDILOL 25 MG PO TABS  Oral Take 50 mg by mouth 2 (two) times daily.     . INSULIN ASPART 100 UNIT/ML [place] SOLN Subcutaneous Inject 0-11 Units into the skin 3 (three) times daily with meals. Sliding scale:  <70 0 units, drink orange juice; 70-120 0 units; 121-150 1 unit; 151-200 2 units; 201-250 3 units; 251-300 5 units; 301-350 7 units; 351-400 9 units; >400 11 units & call MD    . INSULIN GLARGINE 100 UNIT/ML  SOLN Subcutaneous Inject 30-40 Units into the skin See admin instructions. Takes 40 units in the morning, and 30 units at bedtime    . LEVOTHYROXINE SODIUM 137 MCG PO TABS Oral Take 137 mcg by mouth daily. Name brand    . MYCOPHENOLATE MOFETIL 250 MG PO  CAPS Oral Take 500 mg by mouth 2 (two) times daily.     [name] PREDNISONE 5 MG PO TABS Oral Take 5 mg by mouth daily.      [name] RANITIDINE HCL 150 MG PO CAPS Oral Take 150 mg by mouth at bedtime.     [name] TACROLIMUS 1 MG PO CAPS Oral Take 1 mg by mouth 2 (two) times daily.       BP 144/68  Pulse 86  Temp 97.8 F (36.6 C) (Oral)  Resp 20  Ht 5\' 7"  (1.702 m)  Wt 244 lb (110.678 kg)  BMI 38.22 kg/m2  SpO2 96%  LMP 11/19/2011  Physical Exam  Nursing note and vitals reviewed. Constitutional: She appears well-developed and well-nourished. No distress.  HENT:  Head: Normocephalic and atraumatic.  Right Ear: Tympanic membrane, external ear and ear canal normal.  Left Ear: External ear normal.  Nose: Nose normal. Right sinus exhibits no maxillary sinus tenderness and no frontal sinus tenderness. Left sinus exhibits no maxillary sinus tenderness and no frontal sinus tenderness.  Mouth/Throat: Uvula is midline, oropharynx is clear and moist and mucous membranes are normal. Mucous membranes are not cyanotic. No oropharyngeal exudate, posterior oropharyngeal edema, posterior oropharyngeal erythema or tonsillar abscesses.       L ear cerumen impaction  Eyes: Conjunctivae normal are normal. Pupils are equal, round, and reactive to light. No scleral icterus.  Neck: Normal range of motion. Neck supple.  Cardiovascular: Normal rate, regular rhythm, normal heart sounds and intact distal pulses.  Exam reveals no gallop and no friction rub.   No murmur heard. Pulmonary/Chest: Effort normal and breath sounds normal. No respiratory distress. She has no wheezes.  Abdominal: Soft. Bowel sounds are normal. She exhibits no mass. There is no tenderness. There is no rebound and no guarding.  Musculoskeletal: Normal range of motion. She exhibits no edema.  Lymphadenopathy:    She has no cervical adenopathy.  Neurological: She is alert.       Speech is clear and goal oriented Moves extremities without ataxia  Skin:  Skin is warm and dry. She is not diaphoretic.       Venous stasis skin changes bilateral lower extremities.  Scars from healed ulcerations, no open wounds at this time.  No open wounds on feet.    Psychiatric: She has a normal mood and affect.    ED Course  Procedures (including critical care time)  Labs Reviewed  GLUCOSE, CAPILLARY - Abnormal; Notable for the following:    Glucose-Capillary 470 (*)     All other components within normal limits  COMPREHENSIVE METABOLIC PANEL - Abnormal; Notable for the following:    Sodium 130 (*)     Chloride 94 (*)  Glucose, Bld 539 (*)     BUN 26 (*)     Creatinine, Ser 1.21 (*)     Calcium 10.8 (*)     Alkaline Phosphatase 140 (*)     GFR calc non Af Amer 53 (*)     GFR calc Af Amer 62 (*)     All other components within normal limits  URINALYSIS, ROUTINE W REFLEX MICROSCOPIC - Abnormal; Notable for the following:    Glucose, UA >1000 (*)     Ketones, ur 15 (*)     All other components within normal limits  GLUCOSE, CAPILLARY - Abnormal; Notable for the following:    Glucose-Capillary 494 (*)     All other components within normal limits  GLUCOSE, CAPILLARY - Abnormal; Notable for the following:    Glucose-Capillary 474 (*)     All other components within normal limits  GLUCOSE, CAPILLARY - Abnormal; Notable for the following:    Glucose-Capillary 471 (*)     All other components within normal limits  GLUCOSE, CAPILLARY - Abnormal; Notable for the following:    Glucose-Capillary 418 (*)     All other components within normal limits  CBC WITH DIFFERENTIAL  URINE MICROSCOPIC-ADD ON  PREGNANCY, URINE   No results found. Results for orders placed during the hospital encounter of 07/04/12  GLUCOSE, CAPILLARY      Component Value Range   Glucose-Capillary 470 (*) 70 - 99 mg/dL  CBC WITH DIFFERENTIAL      Component Value Range   WBC 5.6  4.0 - 10.5 K/uL   RBC 5.07  3.87 - 5.11 MIL/uL   Hemoglobin 14.8  12.0 - 15.0 g/dL   HCT 16.1   09.6 - 04.5 %   MCV 88.2  78.0 - 100.0 fL   MCH 29.2  26.0 - 34.0 pg   MCHC 33.1  30.0 - 36.0 g/dL   RDW 40.9  81.1 - 91.4 %   Platelets 170  150 - 400 K/uL   Neutrophils Relative 72  43 - 77 %   Neutro Abs 4.0  1.7 - 7.7 K/uL   Lymphocytes Relative 17  12 - 46 %   Lymphs Abs 0.9  0.7 - 4.0 K/uL   Monocytes Relative 10  3 - 12 %   Monocytes Absolute 0.6  0.1 - 1.0 K/uL   Eosinophils Relative 2  0 - 5 %   Eosinophils Absolute 0.1  0.0 - 0.7 K/uL   Basophils Relative 0  0 - 1 %   Basophils Absolute 0.0  0.0 - 0.1 K/uL  COMPREHENSIVE METABOLIC PANEL      Component Value Range   Sodium 130 (*) 135 - 145 mEq/L   Potassium 4.6  3.5 - 5.1 mEq/L   Chloride 94 (*) 96 - 112 mEq/L   CO2 24  19 - 32 mEq/L   Glucose, Bld 539 (*) 70 - 99 mg/dL   BUN 26 (*) 6 - 23 mg/dL   Creatinine, Ser 7.82 (*) 0.50 - 1.10 mg/dL   Calcium 95.6 (*) 8.4 - 10.5 mg/dL   Total Protein 7.8  6.0 - 8.3 g/dL   Albumin 3.7  3.5 - 5.2 g/dL   AST 13  0 - 37 U/L   ALT 14  0 - 35 U/L   Alkaline Phosphatase 140 (*) 39 - 117 U/L   Total Bilirubin 0.7  0.3 - 1.2 mg/dL   GFR calc non Af Amer 53 (*) >90 mL/min   GFR calc  Af Amer 62 (*) >90 mL/min  URINALYSIS, ROUTINE W REFLEX MICROSCOPIC      Component Value Range   Color, Urine YELLOW  YELLOW   APPearance CLEAR  CLEAR   Specific Gravity, Urine 1.024  1.005 - 1.030   pH 6.5  5.0 - 8.0   Glucose, UA >1000 (*) NEGATIVE mg/dL   Hgb urine dipstick NEGATIVE  NEGATIVE   Bilirubin Urine NEGATIVE  NEGATIVE   Ketones, ur 15 (*) NEGATIVE mg/dL   Protein, ur NEGATIVE  NEGATIVE mg/dL   Urobilinogen, UA 0.2  0.0 - 1.0 mg/dL   Nitrite NEGATIVE  NEGATIVE   Leukocytes, UA NEGATIVE  NEGATIVE  URINE MICROSCOPIC-ADD ON      Component Value Range   Squamous Epithelial / LPF RARE  RARE   WBC, UA 0-2  <3 WBC/hpf   RBC / HPF 0-2  <3 RBC/hpf   Bacteria, UA RARE  RARE   Urine-Other RARE YEAST    PREGNANCY, URINE      Component Value Range   Preg Test, Ur NEGATIVE  NEGATIVE    GLUCOSE, CAPILLARY      Component Value Range   Glucose-Capillary 494 (*) 70 - 99 mg/dL  GLUCOSE, CAPILLARY      Component Value Range   Glucose-Capillary 474 (*) 70 - 99 mg/dL  GLUCOSE, CAPILLARY      Component Value Range   Glucose-Capillary 471 (*) 70 - 99 mg/dL  GLUCOSE, CAPILLARY      Component Value Range   Glucose-Capillary 418 (*) 70 - 99 mg/dL   Comment 1 Notify RN     Comment 2 Documented in Chart     No results found.    1. Hyperglycemia   2. History of kidney transplant   3. Hyponatremia       MDM  [name] presents emergency department with elevated blood sugar.  History of DKA we'll test for same today.  Glucose 470 on arrival.   Urinalysis with mild ketones, CBC unremarkable, CMP with hyponatremic, hypochloremia without anion gap.  Patient started on a glucose stabilizer and fluid administration. Plan is to recheck electorlyte levels, BUN and creatinine and if stable for patient to followup with her primary care physician.   Patient to be moved to CDU. I discussed this patient with Tatyana, PA-C and she will follow.        Dahlia Client [place] N[place]aye, PA-C 07/04/12 1210

## 2012-07-04 NOTE — ED Notes (Signed)
Pt c/o body aches all over 6/10

## 2012-07-05 NOTE — ED Provider Notes (Signed)
Medical screening examination/treatment/procedure(s) were performed by non-physician practitioner and as supervising physician I was immediately available for consultation/collaboration.  Siraj Dermody, MD 07/05/12 1930 

## 2012-08-04 ENCOUNTER — Inpatient Hospital Stay (HOSPITAL_COMMUNITY): Payer: Medicare Other

## 2012-08-04 ENCOUNTER — Encounter (HOSPITAL_COMMUNITY): Payer: Self-pay | Admitting: *Deleted

## 2012-08-04 ENCOUNTER — Inpatient Hospital Stay (HOSPITAL_COMMUNITY)
Admission: EM | Admit: 2012-08-04 | Discharge: 2012-08-08 | DRG: 637 | Disposition: A | Discharge status: Home / Self Care | Payer: Medicare Other | Attending: Internal Medicine | Admitting: Internal Medicine

## 2012-08-04 DIAGNOSIS — I10 Essential (primary) hypertension: Secondary | ICD-10-CM

## 2012-08-04 DIAGNOSIS — E1010 Type 1 diabetes mellitus with ketoacidosis without coma: Secondary | ICD-10-CM | POA: Diagnosis present

## 2012-08-04 DIAGNOSIS — A419 Sepsis, unspecified organism: Secondary | ICD-10-CM | POA: Diagnosis present

## 2012-08-04 DIAGNOSIS — E872 Acidosis: Secondary | ICD-10-CM | POA: Diagnosis present

## 2012-08-04 DIAGNOSIS — D72829 Elevated white blood cell count, unspecified: Secondary | ICD-10-CM | POA: Diagnosis present

## 2012-08-04 DIAGNOSIS — D849 Immunodeficiency, unspecified: Secondary | ICD-10-CM | POA: Diagnosis present

## 2012-08-04 DIAGNOSIS — N186 End stage renal disease: Secondary | ICD-10-CM | POA: Diagnosis present

## 2012-08-04 DIAGNOSIS — I120 Hypertensive chronic kidney disease with stage 5 chronic kidney disease or end stage renal disease: Secondary | ICD-10-CM | POA: Diagnosis present

## 2012-08-04 DIAGNOSIS — Z87891 Personal history of nicotine dependence: Secondary | ICD-10-CM

## 2012-08-04 DIAGNOSIS — Z79899 Other long term (current) drug therapy: Secondary | ICD-10-CM

## 2012-08-04 DIAGNOSIS — E039 Hypothyroidism, unspecified: Secondary | ICD-10-CM | POA: Diagnosis present

## 2012-08-04 DIAGNOSIS — F32A Depression, unspecified: Secondary | ICD-10-CM | POA: Diagnosis present

## 2012-08-04 DIAGNOSIS — Z23 Encounter for immunization: Secondary | ICD-10-CM

## 2012-08-04 DIAGNOSIS — E871 Hypo-osmolality and hyponatremia: Secondary | ICD-10-CM | POA: Diagnosis present

## 2012-08-04 DIAGNOSIS — Z940 Kidney transplant status: Secondary | ICD-10-CM

## 2012-08-04 DIAGNOSIS — R739 Hyperglycemia, unspecified: Secondary | ICD-10-CM

## 2012-08-04 DIAGNOSIS — B3749 Other urogenital candidiasis: Secondary | ICD-10-CM | POA: Diagnosis present

## 2012-08-04 DIAGNOSIS — F329 Major depressive disorder, single episode, unspecified: Secondary | ICD-10-CM | POA: Diagnosis present

## 2012-08-04 DIAGNOSIS — E8729 Other acidosis: Secondary | ICD-10-CM | POA: Diagnosis present

## 2012-08-04 DIAGNOSIS — E875 Hyperkalemia: Secondary | ICD-10-CM | POA: Diagnosis present

## 2012-08-04 DIAGNOSIS — Z794 Long term (current) use of insulin: Secondary | ICD-10-CM

## 2012-08-04 DIAGNOSIS — Z992 Dependence on renal dialysis: Secondary | ICD-10-CM

## 2012-08-04 DIAGNOSIS — E1110 Type 2 diabetes mellitus with ketoacidosis without coma: Secondary | ICD-10-CM

## 2012-08-04 LAB — URINE MICROSCOPIC-ADD ON

## 2012-08-04 LAB — GLUCOSE, CAPILLARY
Glucose-Capillary: >600 mg/dL (ref 70–99)
Glucose-Capillary: >600 mg/dL (ref 70–99)

## 2012-08-04 LAB — URINALYSIS, ROUTINE W REFLEX MICROSCOPIC
Glucose, UA: >1000 mg/dL — AB
Hgb urine dipstick: NEGATIVE
Ketones, ur: >80 mg/dL — AB
Protein, ur: NEGATIVE mg/dL

## 2012-08-04 LAB — BLOOD GAS, VENOUS
Bicarbonate: 10.8 mEq/L — ABNORMAL LOW (ref 20.0–24.0)
FIO2: 0.21 %
TCO2: 10.2 mmol/L (ref 0–100)
pCO2, Ven: 33.2 mmHg — ABNORMAL LOW (ref 45.0–50.0)
pH, Ven: 7.139 — CL (ref 7.250–7.300)

## 2012-08-04 LAB — MRSA PCR SCREENING: MRSA by PCR: POSITIVE — AB

## 2012-08-04 LAB — CBC
MCV: 90.7 fL (ref 78.0–100.0)
Platelets: 193 10*3/uL (ref 150–400)
RBC: 4.93 MIL/uL (ref 3.87–5.11)
RDW: 13.3 % (ref 11.5–15.5)
WBC: 15 10*3/uL — ABNORMAL HIGH (ref 4.0–10.5)

## 2012-08-04 LAB — BASIC METABOLIC PANEL
BUN: 52 mg/dL — ABNORMAL HIGH (ref 6–23)
BUN: 52 mg/dL — ABNORMAL HIGH (ref 6–23)
Calcium: 10.4 mg/dL (ref 8.4–10.5)
Calcium: 10.1 mg/dL (ref 8.4–10.5)
Calcium: 10.1 mg/dL (ref 8.4–10.5)
Calcium: 9.9 mg/dL (ref 8.4–10.5)
Chloride: 94 mEq/L — ABNORMAL LOW (ref 96–112)
Creatinine, Ser: 1.54 mg/dL — ABNORMAL HIGH (ref 0.50–1.10)
Creatinine, Ser: 1.47 mg/dL — ABNORMAL HIGH (ref 0.50–1.10)
Creatinine, Ser: 1.58 mg/dL — ABNORMAL HIGH (ref 0.50–1.10)
Creatinine, Ser: 1.66 mg/dL — ABNORMAL HIGH (ref 0.50–1.10)
GFR calc Af Amer: 49 mL/min — ABNORMAL LOW (ref >90)
GFR calc Af Amer: 45 mL/min — ABNORMAL LOW (ref >90)
GFR calc Af Amer: 42 mL/min — ABNORMAL LOW (ref >90)
GFR calc non Af Amer: 40 mL/min — ABNORMAL LOW (ref >90)
GFR calc non Af Amer: 42 mL/min — ABNORMAL LOW (ref >90)
GFR calc non Af Amer: 39 mL/min — ABNORMAL LOW (ref >90)
GFR calc non Af Amer: 36 mL/min — ABNORMAL LOW (ref >90)
Glucose, Bld: 778 mg/dL (ref 70–99)
Glucose, Bld: 750 mg/dL (ref 70–99)
Potassium: 6.3 mEq/L (ref 3.5–5.1)
Sodium: 131 mEq/L — ABNORMAL LOW (ref 135–145)
Sodium: 134 mEq/L — ABNORMAL LOW (ref 135–145)

## 2012-08-04 LAB — COMPREHENSIVE METABOLIC PANEL
AST: 15 U/L (ref 0–37)
Albumin: 3.8 g/dL (ref 3.5–5.2)
Alkaline Phosphatase: 162 U/L — ABNORMAL HIGH (ref 39–117)
Chloride: 87 mEq/L — ABNORMAL LOW (ref 96–112)
Potassium: 5.8 mEq/L — ABNORMAL HIGH (ref 3.5–5.1)
Sodium: 130 mEq/L — ABNORMAL LOW (ref 135–145)
Total Bilirubin: 0.6 mg/dL (ref 0.3–1.2)

## 2012-08-04 LAB — CBC WITH DIFFERENTIAL/PLATELET
Basophils Absolute: 0 10*3/uL (ref 0.0–0.1)
Basophils Relative: 0 % (ref 0–1)
Hemoglobin: 14.8 g/dL (ref 12.0–15.0)
MCHC: 31.9 g/dL (ref 30.0–36.0)
Neutro Abs: 10.5 10*3/uL — ABNORMAL HIGH (ref 1.7–7.7)
Neutrophils Relative %: 89 % — ABNORMAL HIGH (ref 43–77)
RDW: 13.2 % (ref 11.5–15.5)

## 2012-08-04 LAB — LIPASE, BLOOD: Lipase: 9 U/L — ABNORMAL LOW (ref 11–59)

## 2012-08-04 MED ORDER — CARVEDILOL 25 MG PO TABS
50.0000 mg | ORAL_TABLET | Freq: Two times a day (BID) | ORAL | Status: DC
  Administered 2012-08-04 – 2012-08-08 (×7): 50 mg via ORAL
  Filled 2012-08-04 (×10): qty 2

## 2012-08-04 MED ORDER — AMLODIPINE BESYLATE 5 MG PO TABS
5.0000 mg | ORAL_TABLET | Freq: Every day | ORAL | Status: DC
  Administered 2012-08-04 – 2012-08-08 (×5): 5 mg via ORAL
  Filled 2012-08-04 (×5): qty 1

## 2012-08-04 MED ORDER — HYDROCODONE-ACETAMINOPHEN 5-325 MG PO TABS
1.0000 | ORAL_TABLET | ORAL | Status: DC | PRN
  Administered 2012-08-06: 1 via ORAL
  Administered 2012-08-06 – 2012-08-08 (×5): 2 via ORAL
  Filled 2012-08-04 (×2): qty 2
  Filled 2012-08-04: qty 1
  Filled 2012-08-04 (×3): qty 2

## 2012-08-04 MED ORDER — SODIUM CHLORIDE 0.9 % IV SOLN: INTRAVENOUS | Status: DC

## 2012-08-04 MED ORDER — INSULIN REGULAR BOLUS VIA INFUSION: 0.0000 [IU] | Freq: Three times a day (TID) | INTRAVENOUS | Status: DC

## 2012-08-04 MED ORDER — ACETAMINOPHEN 650 MG RE SUPP: 650.0000 mg | Freq: Four times a day (QID) | RECTAL | Status: DC | PRN

## 2012-08-04 MED ORDER — MYCOPHENOLATE MOFETIL 250 MG PO CAPS
500.0000 mg | ORAL_CAPSULE | Freq: Two times a day (BID) | ORAL | Status: DC
  Administered 2012-08-04 – 2012-08-08 (×8): 500 mg via ORAL
  Filled 2012-08-04 (×10): qty 2

## 2012-08-04 MED ORDER — SODIUM CHLORIDE 0.9 % IV BOLUS (SEPSIS)
1000.0000 mL | Freq: Once | INTRAVENOUS | Status: AC
  Administered 2012-08-04: 1000 mL via INTRAVENOUS

## 2012-08-04 MED ORDER — MORPHINE SULFATE 2 MG/ML IJ SOLN
1.0000 mg | INTRAMUSCULAR | Status: DC | PRN
  Filled 2012-08-04: qty 1

## 2012-08-04 MED ORDER — SODIUM CHLORIDE 0.9 % IV SOLN
INTRAVENOUS | Status: AC
  Administered 2012-08-04: 999 mL via INTRAVENOUS

## 2012-08-04 MED ORDER — DEXTROSE 50 % IV SOLN: 25.0000 mL | INTRAVENOUS | Status: DC | PRN

## 2012-08-04 MED ORDER — VANCOMYCIN HCL 1000 MG IV SOLR
1500.0000 mg | INTRAVENOUS | Status: DC
  Administered 2012-08-05: 1500 mg via INTRAVENOUS
  Filled 2012-08-04: qty 1500

## 2012-08-04 MED ORDER — CHLORHEXIDINE GLUCONATE CLOTH 2 % EX PADS
6.0000 | MEDICATED_PAD | Freq: Every day | CUTANEOUS | Status: DC
  Administered 2012-08-05 – 2012-08-08 (×3): 6 via TOPICAL

## 2012-08-04 MED ORDER — DEXTROSE-NACL 5-0.45 % IV SOLN
INTRAVENOUS | Status: DC
  Administered 2012-08-04: 14:00:00 via INTRAVENOUS

## 2012-08-04 MED ORDER — SODIUM POLYSTYRENE SULFONATE 15 GM/60ML PO SUSP
30.0000 g | Freq: Once | ORAL | Status: AC
  Administered 2012-08-04: 30 g via ORAL
  Filled 2012-08-04: qty 120

## 2012-08-04 MED ORDER — LEVOTHYROXINE SODIUM 137 MCG PO TABS
137.0000 ug | ORAL_TABLET | Freq: Every day | ORAL | Status: DC
  Administered 2012-08-05 – 2012-08-08 (×4): 137 ug via ORAL
  Filled 2012-08-04 (×6): qty 1

## 2012-08-04 MED ORDER — SODIUM CHLORIDE 0.9 % IJ SOLN
3.0000 mL | Freq: Two times a day (BID) | INTRAMUSCULAR | Status: DC
  Administered 2012-08-04 – 2012-08-07 (×4): 3 mL via INTRAVENOUS

## 2012-08-04 MED ORDER — ONDANSETRON HCL 4 MG/2ML IJ SOLN
4.0000 mg | Freq: Once | INTRAMUSCULAR | Status: AC
  Administered 2012-08-04: 4 mg via INTRAVENOUS
  Filled 2012-08-04: qty 2

## 2012-08-04 MED ORDER — PREDNISONE 5 MG PO TABS
5.0000 mg | ORAL_TABLET | Freq: Every day | ORAL | Status: DC
  Administered 2012-08-04 – 2012-08-08 (×5): 5 mg via ORAL
  Filled 2012-08-04 (×5): qty 1

## 2012-08-04 MED ORDER — MUPIROCIN 2 % EX OINT
1.0000 "application " | TOPICAL_OINTMENT | Freq: Two times a day (BID) | CUTANEOUS | Status: DC
  Administered 2012-08-04 – 2012-08-08 (×8): 1 via NASAL
  Filled 2012-08-04: qty 22

## 2012-08-04 MED ORDER — ONDANSETRON HCL 4 MG/2ML IJ SOLN
4.0000 mg | Freq: Three times a day (TID) | INTRAMUSCULAR | Status: AC | PRN
  Filled 2012-08-04: qty 2

## 2012-08-04 MED ORDER — ACETAMINOPHEN 325 MG PO TABS
650.0000 mg | ORAL_TABLET | Freq: Four times a day (QID) | ORAL | Status: DC | PRN
  Administered 2012-08-05: 650 mg via ORAL
  Filled 2012-08-04: qty 2

## 2012-08-04 MED ORDER — SODIUM CHLORIDE 0.9 % IV SOLN
INTRAVENOUS | Status: DC
  Administered 2012-08-04: 100 mL via INTRAVENOUS
  Administered 2012-08-05: 17:00:00 via INTRAVENOUS

## 2012-08-04 MED ORDER — DEXTROSE-NACL 5-0.45 % IV SOLN
INTRAVENOUS | Status: DC
  Administered 2012-08-05: 75 mL/h via INTRAVENOUS

## 2012-08-04 MED ORDER — SODIUM CHLORIDE 0.9 % IV SOLN: INTRAVENOUS | Status: AC

## 2012-08-04 MED ORDER — SODIUM CHLORIDE 0.9 % IV SOLN
INTRAVENOUS | Status: DC
  Administered 2012-08-04: 5.4 [IU]/h via INTRAVENOUS
  Administered 2012-08-05: 8.8 [IU]/h via INTRAVENOUS
  Filled 2012-08-04 (×2): qty 1

## 2012-08-04 MED ORDER — TACROLIMUS 1 MG PO CAPS
1.0000 mg | ORAL_CAPSULE | Freq: Two times a day (BID) | ORAL | Status: DC
  Administered 2012-08-04 – 2012-08-08 (×8): 1 mg via ORAL
  Filled 2012-08-04 (×10): qty 1

## 2012-08-04 MED ORDER — VANCOMYCIN HCL 1000 MG IV SOLR
2000.0000 mg | Freq: Once | INTRAVENOUS | Status: AC
  Administered 2012-08-04: 2000 mg via INTRAVENOUS
  Filled 2012-08-04: qty 2000

## 2012-08-04 MED ORDER — ONDANSETRON HCL 4 MG PO TABS: 4.0000 mg | ORAL_TABLET | Freq: Four times a day (QID) | ORAL | Status: DC | PRN

## 2012-08-04 MED ORDER — ONDANSETRON HCL 4 MG/2ML IJ SOLN
4.0000 mg | Freq: Four times a day (QID) | INTRAMUSCULAR | Status: DC | PRN
  Administered 2012-08-05: 4 mg via INTRAVENOUS

## 2012-08-04 MED ORDER — PIPERACILLIN-TAZOBACTAM 3.375 G IVPB
3.3750 g | Freq: Three times a day (TID) | INTRAVENOUS | Status: DC
  Administered 2012-08-04 – 2012-08-06 (×5): 3.375 g via INTRAVENOUS
  Filled 2012-08-04 (×7): qty 50

## 2012-08-04 MED ORDER — FAMOTIDINE 20 MG PO TABS
20.0000 mg | ORAL_TABLET | Freq: Every day | ORAL | Status: DC
  Administered 2012-08-04 – 2012-08-08 (×5): 20 mg via ORAL
  Filled 2012-08-04 (×5): qty 1

## 2012-08-04 MED ORDER — SODIUM CHLORIDE 0.9 % IV SOLN
INTRAVENOUS | Status: DC
  Filled 2012-08-04: qty 1

## 2012-08-04 NOTE — H&P (Addendum)
[facility] History and Physical  [name] ZOX:[id number] DOB: Aug 13, 1966 DOA: 08/04/2012  Referring physician: ER physician PCP: [name], MD   Chief Complaint: nausea/ vomiting  HPI:  46 year old female with history of ESRD status post kidney transplant on immunosuppressive therapy, Diabetes mellitus type I, HTN who presented to ED with intractable nausea and vomiting stated about 1 day prior to admission. Patient reported her blood sugar reading was high in the morning prior to the admission. Patient reported occasional abdominal discomfort due to vomiting but inconsistent.  No reports of blood in stool or urine. No chest pain, no shortness of breath, no palpitations. No fever or chills. No cough. No lightheadedness or dizziness or loss of consciousness.  Assessment and Plan:  Principal Problem:  *DKA, type 1  We will admit to stepdown   DKA order set in place with IV fluids, insulin drip, NPO  Follow up BMP and CBG per DKA protocol  Check A1c  Active Problems:  Leukocytosis  No obvious sign of infection  Will get urinalysis and urine culture  Will defer antibiotic treatment for now until above results available   Metabolic acidosis  With elevated anion gap  Secondary to DKA  Will improve with insulin drip and fluids   Hypercalcemia  Maybe secondary to dehydration  Continue IV fluids  Follow up calcium in am   H/O kidney transplant  Continue Cellceptt prednisone and tacrolimus per home regimen   HYPERTENSION  Continue Norvasc and Coreg   Hypothyroid  Continue levothyroxine   Hyperkalemia  Patient will receive insulin which may drive potassium down  Will follow up next BMP in 2 hours and correct potassium if still elevated  Tele monitoring   Hyponatremia  Secondary to dehydration   Will continue to follow sodium trend  [name] [facility] [phone]  Review of Systems:  Constitutional: Negative for fever, chills  and malaise/fatigue. Negative for diaphoresis.  HENT: Negative for hearing loss, ear pain, nosebleeds, congestion, sore throat, neck pain, tinnitus and ear discharge.   Eyes: Negative for blurred vision, double vision, photophobia, pain, discharge and redness.  Respiratory: Negative for cough, hemoptysis, sputum production, shortness of breath, wheezing and stridor.   Cardiovascular: Negative for chest pain, palpitations, orthopnea, claudication and leg swelling.  Gastrointestinal: positive for nausea, vomiting and abdominal pain. Negative for heartburn, constipation, blood in stool and melena.  Genitourinary: Negative for dysuria, urgency, frequency, hematuria and flank pain.  Musculoskeletal: Negative for myalgias, back pain, joint pain and falls.  Skin: Negative for itching and rash.  Neurological: Negative for dizziness and weakness. Negative for tingling, tremors, sensory change, speech change, focal weakness, loss of consciousness and headaches.  Endo/Heme/Allergies: Negative for environmental allergies and polydipsia. Does not bruise/bleed easily.  Psychiatric/Behavioral: Negative for suicidal ideas. The patient is not nervous/anxious.      Past Medical History  Diagnosis Date  . Hypertension   . Hypothyroid   . Depression   . 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 Kidney disease   . End stage renal disease     right arm AV graft, post transplant  . Diabetes mellitus     Insulin dependant   Past Surgical History  Procedure Date  . Back surgery 2001  . Tubal ligation   . Dg av dialysis graft declot or   . Nephrectomy transplanted organ   . Kidney transplant    Social History:  reports that she quit smoking about 13 years ago. Her smoking  use included Cigarettes. She has a 11 pack-year smoking history. She has never used smokeless tobacco. She reports that she does not drink alcohol or use illicit drugs.  Allergies  Allergen Reactions  . Ace Inhibitors  Other (See Comments)    cough  . Adhesive (Tape) Itching    Family History:hypothyroidism mother  Prior to Admission medications   Medication Sig Start Date End Date Taking? Authorizing Provider  amLODipine (NORVASC) 5 MG tablet Take 5 mg by mouth daily.     Yes Historical Provider, MD  carvedilol (COREG) 25 MG tablet Take 50 mg by mouth 2 (two) times daily.    Yes Historical Provider, MD  insulin aspart (NOVOLOG) 100 UNIT/ML injection Inject 0-11 Units into the skin 3 (three) times daily with meals. Sliding scale:  <70 0 units, drink orange juice; 70-120 0 units; 121-150 1 unit; 151-200 2 units; 201-250 3 units; 251-300 5 units; 301-350 7 units; 351-400 9 units; >400 11 units & call MD   Yes Historical Provider, MD  insulin glargine (LANTUS) 100 UNIT/ML injection Inject 30-40 Units into the skin See admin instructions. Takes 40 units in the morning, and 30 units at bedtime   Yes Historical Provider, MD  levothyroxine (SYNTHROID, LEVOTHROID) 137 MCG tablet Take 137 mcg by mouth daily. Name brand   Yes Historical Provider, MD  mycophenolate (CELLCEPT) 250 MG capsule Take 500 mg by mouth 2 (two) times daily.    Yes Historical Provider, MD  predniSONE (DELTASONE) 5 MG tablet Take 5 mg by mouth daily.     Yes Historical Provider, MD  ranitidine (ZANTAC) 150 MG capsule Take 150 mg by mouth at bedtime.    Yes Historical Provider, MD  tacrolimus (PROGRAF) 1 MG capsule Take 1 mg by mouth 2 (two) times daily.    Yes Historical Provider, MD   Physical Exam: Filed Vitals:   08/04/12 1419  BP: 115/47  Pulse: 95  SpO2: 100%    Physical Exam  Constitutional: Appears well-developed and well-nourished. No distress.  HENT: Normocephalic. External right and left ear normal. Oropharynx is clear and moist.  Eyes: Conjunctivae and EOM are normal. PERRLA, no scleral icterus.  Neck: Normal ROM. Neck supple. No JVD. No tracheal deviation. No thyromegaly.  CVS: RRR, S1/S2 +, no murmurs, no gallops, no carotid  bruit.  Pulmonary: Effort and breath sounds normal, no stridor, rhonchi, wheezes, rales.  Abdominal: Soft. BS +,  no distension, tenderness, rebound or guarding.  Musculoskeletal: Normal range of motion. No edema and no tenderness.  Lymphadenopathy: No lymphadenopathy noted, cervical, inguinal. Neuro: Alert. Normal reflexes, muscle tone coordination. No cranial nerve deficit. Skin: Skin is warm and dry. No rash noted. Not diaphoretic. No erythema. No pallor.  Psychiatric: Normal mood and affect. Behavior, judgment, thought content normal.   Labs on Admission:  Basic Metabolic Panel:  Lab 08/04/12 4496  NA 130*  K 5.8*  CL 87*  CO2 12*  GLUCOSE 778*  BUN 51*  CREATININE 1.53*  CALCIUM 11.5*   Liver Function Tests:  Lab 08/04/12 1250  AST 15  ALT 15  ALKPHOS 162*  BILITOT 0.6  PROT 7.9  ALBUMIN 3.8    Lab 08/04/12 1250  LIPASE 9*  AMYLASE --   No results found for this basename: AMMONIA:5 in the last 168 hours CBC:  Lab 08/04/12 1250  WBC 11.9*  NEUTROABS 10.5*  HGB 14.8  HCT 46.4*  MCV 90.6  PLT 213   Cardiac Enzymes: No results found for this basename: CKTOTAL:5,CKMB:5,CKMBINDEX:5,TROPONINI:5 in  the last 168 hours BNP: No components found with this basename: POCBNP:5 CBG: No results found for this basename: GLUCAP:5 in the last 168 hours  Radiological Exams on Admission: No results found.  EKG: Normal sinus rhythm, no ST/T wave changes  Code Status: Full Family Communication: Pt at bedside Disposition Plan: Admit for further evaluation; inpatient; stepdown unit  [name], MD  [facility] Pager [phone]  If 7PM-7AM, please contact night-coverage [email or web address] Password [facility] 08/04/2012, 2:54 PM

## 2012-08-04 NOTE — ED Provider Notes (Signed)
History     CSN: [id number]  Arrival date & time 08/04/12  1137   First MD Initiated Contact with Patient 08/04/12 1140      Chief Complaint  Patient presents with  . Hyperglycemia    (Consider location/radiation/quality/duration/timing/severity/associated sxs/prior treatment) HPI [name] is a 46 y.o. female who presents with complaint of nausea, vomiting, elevated blood sugar this morning. Pt is an insulin dependant diabetic, with hx of renal transplant, here with frequent hyperglycemia exacerbations. Pt states she went and saw Dr. Mellissa Kohut, her physician, who just switched he to a different type of insulin. States she has been taking it with as supposed to, was feeling well yesterday. Went to bed, and was up several times with polyuria. State this morning started having nausea, vomiting, her blood sugar meter read "high." Pt did not take any of her medications this morning, called EMS.   Past Medical History  Diagnosis Date  . Hypertension   . Hypothyroid   . Depression   . 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 Kidney disease   . End stage renal disease     right arm AV graft, post transplant  . Diabetes mellitus     Insulin dependant    Past Surgical History  Procedure Date  . Back surgery 2001  . Tubal ligation   . Dg av dialysis graft declot or   . Nephrectomy transplanted organ   . Kidney transplant     Family History  Problem Relation Age of Onset  . Emphysema Mother   . Throat cancer Mother   . COPD Mother   . Cancer Mother   . Emphysema Father   . COPD Father   . Stroke Father     History  Substance Use Topics  . Smoking status: Former Smoker -- 1.0 packs/day for 11 years    Types: Cigarettes    Quit date: 09/21/1998  . Smokeless tobacco: Never Used  . Alcohol Use: No    OB History    Grav Para Term Preterm Abortions TAB SAB Ect Mult Living                  Review of Systems  Constitutional: Positive for  chills.  Gastrointestinal: Positive for nausea, vomiting and abdominal pain.  Genitourinary: Positive for dysuria and frequency.  Musculoskeletal: Positive for myalgias.  Neurological: Positive for dizziness, weakness, light-headedness and headaches.  Psychiatric/Behavioral: Positive for confusion.  All other systems reviewed and are negative.    Allergies  Ace inhibitors and Adhesive  Home Medications   Current Outpatient Rx  Name  Route  Sig  Dispense  Refill  . AMLODIPINE BESYLATE 5 MG PO TABS   Oral   Take 5 mg by mouth daily.           [name] CARVEDILOL 25 MG PO TABS   Oral   Take 50 mg by mouth 2 (two) times daily.          . INSULIN ASPART 100 UNIT/ML [place] SOLN   Subcutaneous   Inject 0-11 Units into the skin 3 (three) times daily with meals. Sliding scale:  <70 0 units, drink orange juice; 70-120 0 units; 121-150 1 unit; 151-200 2 units; 201-250 3 units; 251-300 5 units; 301-350 7 units; 351-400 9 units; >400 11 units & call MD         . INSULIN GLARGINE 100 UNIT/ML [place] SOLN   Subcutaneous   Inject 30-40 Units into the skin  See admin instructions. Takes 40 units in the morning, and 30 units at bedtime         . LEVOTHYROXINE SODIUM 137 MCG PO TABS   Oral   Take 137 mcg by mouth daily. Name brand         . MYCOPHENOLATE MOFETIL 250 MG PO CAPS   Oral   Take 500 mg by mouth 2 (two) times daily.          [name] PREDNISONE 5 MG PO TABS   Oral   Take 5 mg by mouth daily.           [name] RANITIDINE HCL 150 MG PO CAPS   Oral   Take 150 mg by mouth at bedtime.          [name] TACROLIMUS 1 MG PO CAPS   Oral   Take 1 mg by mouth 2 (two) times daily.            LMP 11/19/2011  Physical Exam  Nursing note and vitals reviewed. Constitutional: She is oriented to person, place, and time. She appears well-developed and well-nourished.       Uncomfortable appearing.   HENT:  Head: Normocephalic and atraumatic.  Eyes: Conjunctivae normal are normal.  Neck: Neck  supple.  Cardiovascular: Normal rate, regular rhythm and normal heart sounds.   Pulmonary/Chest: Effort normal and breath sounds normal. No respiratory distress. She has no wheezes. She has no rales.  Abdominal: Soft. Bowel sounds are normal. She exhibits no distension. There is tenderness. There is no rebound and no guarding.       Diffuse tenderness  Musculoskeletal: She exhibits no edema.  Neurological: She is alert and oriented to person, place, and time.  Skin: Skin is warm and dry.  Psychiatric: She has a normal mood and affect. Her behavior is normal.    ED Course  Procedures (including critical care time)  Pt with hx of DKA, presents with hyperglycemia, malaise, n/v. Labs pending. CBG >600. Fluids started.   Results for orders placed during the hospital encounter of 08/04/12  CBC WITH DIFFERENTIAL      Component Value Range   WBC 11.9 (*) 4.0 - 10.5 K/uL   RBC 5.12 (*) 3.87 - 5.11 MIL/uL   Hemoglobin 14.8  12.0 - 15.0 g/dL   HCT 11.9 (*) 14.7 - 82.9 %   MCV 90.6  78.0 - 100.0 fL   MCH 28.9  26.0 - 34.0 pg   MCHC 31.9  30.0 - 36.0 g/dL   RDW 56.2  13.0 - 86.5 %   Platelets 213  150 - 400 K/uL   Neutrophils Relative 89 (*) 43 - 77 %   Neutro Abs 10.5 (*) 1.7 - 7.7 K/uL   Lymphocytes Relative 6 (*) 12 - 46 %   Lymphs Abs 0.8  0.7 - 4.0 K/uL   Monocytes Relative 5  3 - 12 %   Monocytes Absolute 0.6  0.1 - 1.0 K/uL   Eosinophils Relative 0  0 - 5 %   Eosinophils Absolute 0.0  0.0 - 0.7 K/uL   Basophils Relative 0  0 - 1 %   Basophils Absolute 0.0  0.0 - 0.1 K/uL  COMPREHENSIVE METABOLIC PANEL      Component Value Range   Sodium 130 (*) 135 - 145 mEq/L   Potassium 5.8 (*) 3.5 - 5.1 mEq/L   Chloride 87 (*) 96 - 112 mEq/L   CO2 12 (*) 19 - 32 mEq/L   Glucose, Bld  778 (*) 70 - 99 mg/dL   BUN 51 (*) 6 - 23 mg/dL   Creatinine, Ser 2.13 (*) 0.50 - 1.10 mg/dL   Calcium 08.6 (*) 8.4 - 10.5 mg/dL   Total Protein 7.9  6.0 - 8.3 g/dL   Albumin 3.8  3.5 - 5.2 g/dL   AST 15  0  - 37 U/L   ALT 15  0 - 35 U/L   Alkaline Phosphatase 162 (*) 39 - 117 U/L   Total Bilirubin 0.6  0.3 - 1.2 mg/dL   GFR calc non Af Amer 40 (*) >90 mL/min   GFR calc Af Amer 46 (*) >90 mL/min  LIPASE, BLOOD      Component Value Range   Lipase 9 (*) 11 - 59 U/L  KETONES, QUALITATIVE      Component Value Range   Acetone, Bld LARGE (*) NEGATIVE  BLOOD GAS, VENOUS      Component Value Range   FIO2 0.21     pH, Ven 7.139 (*) 7.250 - 7.300   pCO2, Ven 33.2 (*) 45.0 - 50.0 mmHg   pO2, Ven 42.6  30.0 - 45.0 mmHg   Bicarbonate 10.8 (*) 20.0 - 24.0 mEq/L   TCO2 10.2  0 - 100 mmol/L   Acid-base deficit 17.6 (*) 0.0 - 2.0 mmol/L   O2 Saturation 73.0     Patient temperature 98.6     Collection site VEIN     Drawn by COLLECTED BY LABORATORY     Sample type VEIN     No results found.  Filed Vitals:   08/04/12 1419  BP: 115/47  Pulse: 95    2:36 PM Pt's nausea improved. She received 1L of NS so far. Glucose stabilizer started. VS normal. Pt appears to be in DKA. Her anion gap is 31. kayexelate given for hyperkalemia, i do suspect this is from her hyperglycemia. She has venous pH of 7.139, large acetone. [facility] called for admission.    1. DKA (diabetic ketoacidoses)   2. Hyperglycemia   3. H/O kidney transplant   4. Hypercalcemia   5. Hyperkalemia       MDM          Lottie Mussel, PA 08/04/12 2014

## 2012-08-04 NOTE — ED Notes (Signed)
CBG resulted greater than 600.

## 2012-08-04 NOTE — Care Management ED Note (Signed)
       CARE MANAGEMENT ED NOTE 08/04/2012  Patient:  FELESHA, MONCRIEFFE   Account Number:  [id number]  Date Initiated:  08/04/2012  Documentation initiated by:  Edd Arbour  Subjective/Objective Assessment:   46 yr old female medicare Reports she is in "donut hole" with medicare and difficulty affording medications dx dka kidney transplant on immunosuppressive therapy, DM type I, HTN pcp coladonato, joseph glucose 778     Subjective/Objective Assessment Detail:     Action/Plan:   ED Cm spoke with pt about medication concerns CM provided written information and discussed [email or web address] and bridges to access CM porvided [email or web address] discount card Encouraged pt to complete applications for medications she is having   Action/Plan Detail:   difficulty obtaining CM placed information along with her jacket in a pt belonging bag Pt voiced understanding and appreciation of resources offered Updated ED RN   Anticipated DC Date:  08/07/2012     Status Recommendation to Physician:   Result of Recommendation:    Other ED Services  Consult Working Plan    DC Planning Services  Other    Choice offered to / List presented to:            Status of service:  Completed, signed off  ED Comments:   ED Comments Detail:

## 2012-08-04 NOTE — ED Notes (Signed)
[id number]<AV> Expected date:<BR> Expected time:<BR> Means of arrival:<BR> Comments:<BR> Hyperglycemic

## 2012-08-04 NOTE — ED Notes (Signed)
Pt was unable to urinate. She will try in a little while.

## 2012-08-04 NOTE — Progress Notes (Signed)
       CARE MANAGEMENT ED NOTE 08/04/2012  Patient:  Sowle,Kameria S   Account Number:  [id number]  Date Initiated:  08/04/2012  Documentation initiated by:  GIBBS,KIMBERLY  Subjective/Objective Assessment:   46 yr old female medicare Reports she is in "donut hole" with medicare and difficulty affording medications dx dka kidney transplant on immunosuppressive therapy, DM type I, HTN pcp coladonato, joseph glucose 778     Subjective/Objective Assessment Detail:     Action/Plan:   ED Cm spoke with pt about medication concerns CM provided written information and discussed [email or web address] and bridges to access CM porvided [email or web address] discount card Encouraged pt to complete applications for medications she is having   Action/Plan Detail:   difficulty obtaining CM placed information along with her jacket in a pt belonging bag Pt voiced understanding and appreciation of resources offered Updated ED RN   Anticipated DC Date:  08/07/2012     Status Recommendation to Physician:   Result of Recommendation:    Other ED Services  Consult Working Plan    DC Planning Services  Other    Choice offered to / List presented to:            Status of service:  Completed, signed off  ED Comments:   ED Comments Detail:     

## 2012-08-04 NOTE — ED Notes (Signed)
Per EMS report pt coming from home with c/o hyperglycemia. Per EMS pt has not been taking her insulin for weeks due to financial difficulties; Per EMS pt has had increased thirst, frequent urination and generalized weakness. Per EMS cbg en route was "high" on their glucometer.

## 2012-08-04 NOTE — Progress Notes (Signed)
CRITICAL VALUE ALERT  Critical value received:  Blood sugar 778, CO2 8  Date of notification:  08/04/12   Time of notification:  1810  Critical value read back:yes  Nurse who received alert:  Lucia Estelle RN  MD notified (1st page):  Dr. [name]  Time of first page:  1815  MD notified (2nd page):  Time of second page:  Responding MD:  Dr. [name]  Time MD responded:  [phone]

## 2012-08-04 NOTE — Progress Notes (Signed)
ANTIBIOTIC CONSULT NOTE - INITIAL  Pharmacy Consult for Vancomycin and Zosyn Indication: rule out sepsis  Allergies  Allergen Reactions  . Ace Inhibitors Other (See Comments)    cough  . Adhesive (Tape) Itching    Patient Measurements:    Vital Signs: Temp: 98 F (36.7 C) (11/28 2000) Temp src: Oral (11/28 2000) BP: 110/56 mmHg (11/28 1600) Pulse Rate: 101  (11/28 1600) Intake/Output from previous day:   Intake/Output from this shift:    Labs:  Basename 08/04/12 1810 08/04/12 1707 08/04/12 1250  WBC -- 15.0* 11.9*  HGB -- 14.2 14.8  PLT -- 193 213  LABCREA -- -- --  CREATININE 1.47* 1.54* 1.53*   The CrCl is unknown because both a height and weight (above a minimum accepted value) are required for this calculation. No results found for this basename: VANCOTROUGH:2,VANCOPEAK:2,VANCORANDOM:2,GENTTROUGH:2,GENTPEAK:2,GENTRANDOM:2,TOBRATROUGH:2,TOBRAPEAK:2,TOBRARND:2,AMIKACINPEAK:2,AMIKACINTROU:2,AMIKACIN:2, in the last 72 hours   Microbiology: Recent Results (from the past 720 hour(s))  MRSA PCR SCREENING     Status: Abnormal   Collection Time   08/04/12  5:21 PM      Component Value Range Status Comment   MRSA by PCR POSITIVE (*) NEGATIVE Final    Medical History: Past Medical History  Diagnosis Date  . Hypertension   . Hypothyroid   . Depression   . 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 Kidney disease   . End stage renal disease     right arm AV graft, post transplant  . Diabetes mellitus     Insulin dependant   Medications:  Anti-infectives    None     Assessment:  46yo F with hx kidney transplant on immunosuppressive therapy, presented with N/V, DKA, leukocytosis and elevated procalcitonin.  Starting Vancomycin and Zosyn.   Weight last month was 111kg.  SCr 1.5, CrCl 88ml/min/1.73m2  Goal of Therapy:  Vancomycin trough level 15-20 mcg/ml  Plan:   Vancomycin 2000mg  IV x 1 then 1500mg  IV q24h.  Zosyn 3.375g IV Q8H  infused over 4hrs.  Measure Vanc trough at steady state.  Follow up renal fxn and culture results.  Charolotte Eke, PharmD, pager [phone]. 08/04/2012,8:47 PM.

## 2012-08-05 ENCOUNTER — Encounter (HOSPITAL_COMMUNITY): Payer: Self-pay

## 2012-08-05 DIAGNOSIS — E872 Acidosis: Secondary | ICD-10-CM

## 2012-08-05 LAB — CBC
HCT: 40.4 % (ref 36.0–46.0)
Hemoglobin: 13.4 g/dL (ref 12.0–15.0)
MCHC: 33.2 g/dL (ref 30.0–36.0)
RDW: 13.4 % (ref 11.5–15.5)
WBC: 8.8 10*3/uL (ref 4.0–10.5)

## 2012-08-05 LAB — GLUCOSE, CAPILLARY
Glucose-Capillary: 408 mg/dL — ABNORMAL HIGH (ref 70–99)
Glucose-Capillary: 185 mg/dL — ABNORMAL HIGH (ref 70–99)
Glucose-Capillary: 142 mg/dL — ABNORMAL HIGH (ref 70–99)
Glucose-Capillary: 215 mg/dL — ABNORMAL HIGH (ref 70–99)
Glucose-Capillary: 393 mg/dL — ABNORMAL HIGH (ref 70–99)
Glucose-Capillary: 353 mg/dL — ABNORMAL HIGH (ref 70–99)
Glucose-Capillary: 176 mg/dL — ABNORMAL HIGH (ref 70–99)

## 2012-08-05 LAB — BASIC METABOLIC PANEL
BUN: 44 mg/dL — ABNORMAL HIGH (ref 6–23)
BUN: 43 mg/dL — ABNORMAL HIGH (ref 6–23)
CO2: 20 mEq/L (ref 19–32)
Calcium: 10.1 mg/dL (ref 8.4–10.5)
Chloride: 103 mEq/L (ref 96–112)
Creatinine, Ser: 1.46 mg/dL — ABNORMAL HIGH (ref 0.50–1.10)
Creatinine, Ser: 1.48 mg/dL — ABNORMAL HIGH (ref 0.50–1.10)
GFR calc Af Amer: 49 mL/min — ABNORMAL LOW (ref >90)
GFR calc non Af Amer: 42 mL/min — ABNORMAL LOW (ref >90)
Glucose, Bld: 177 mg/dL — ABNORMAL HIGH (ref 70–99)
Potassium: 3.6 mEq/L (ref 3.5–5.1)

## 2012-08-05 LAB — URINALYSIS, ROUTINE W REFLEX MICROSCOPIC
Glucose, UA: >1000 mg/dL — AB
Ketones, ur: 15 mg/dL — AB
Leukocytes, UA: NEGATIVE
Specific Gravity, Urine: 1.029 (ref 1.005–1.030)
pH: 6 (ref 5.0–8.0)

## 2012-08-05 LAB — URINE MICROSCOPIC-ADD ON

## 2012-08-05 MED ORDER — INFLUENZA VIRUS VACC SPLIT PF IM SUSP
0.5000 mL | INTRAMUSCULAR | Status: AC
  Filled 2012-08-05: qty 0.5

## 2012-08-05 MED ORDER — INSULIN ASPART 100 UNIT/ML ~~LOC~~ SOLN: 0.0000 [IU] | Freq: Every day | SUBCUTANEOUS | Status: DC

## 2012-08-05 MED ORDER — INSULIN ASPART 100 UNIT/ML ~~LOC~~ SOLN
0.0000 [IU] | Freq: Three times a day (TID) | SUBCUTANEOUS | Status: DC
  Administered 2012-08-05 – 2012-08-06 (×3): 15 [IU] via SUBCUTANEOUS
  Administered 2012-08-06: 11 [IU] via SUBCUTANEOUS

## 2012-08-05 MED ORDER — INSULIN GLARGINE 100 UNIT/ML ~~LOC~~ SOLN: 10.0000 [IU] | Freq: Every day | SUBCUTANEOUS | Status: DC

## 2012-08-05 MED ORDER — PHENOL 1.4 % MT LIQD
1.0000 | OROMUCOSAL | Status: DC | PRN
  Filled 2012-08-05: qty 177

## 2012-08-05 MED ORDER — INSULIN GLARGINE 100 UNIT/ML ~~LOC~~ SOLN
10.0000 [IU] | Freq: Once | SUBCUTANEOUS | Status: AC
Start: 2012-08-05 — End: 2012-08-05
  Administered 2012-08-05: 10 [IU] via SUBCUTANEOUS

## 2012-08-05 NOTE — ED Provider Notes (Signed)
Medical screening examination/treatment/procedure(s) were conducted as a shared visit with non-physician practitioner(s) and myself.  I personally evaluated the patient during the encounter  Initially the patient was ill appearing.  However, on subsequent exams the patient was feeling better, having received some IVF, insulin per protocol.  With the Hx of IDDM, her presenting Sx/Sg, there was immediate suspicion of DKA vs. Infection.  Throughout the patient's ED course, the PA and I coordinated her care.  Given her acidosis, anion gap, hyperkalemia, she required admission for additional evaluation and management.  I saw all relevant labs, discussed them with the PA.  CRITICAL CARE Performed by: Gerhard Munch   Total critical care time: 35  Critical care time was exclusive of separately billable procedures and treating other patients.  Critical care was necessary to treat or prevent imminent or life-threatening deterioration.  Critical care was time spent personally by me on the following activities: development of treatment plan with patient and/or surrogate as well as nursing, discussions with consultants, evaluation of patient's response to treatment, examination of patient, obtaining history from patient or surrogate, ordering and performing treatments and interventions, ordering and review of laboratory studies, ordering and review of radiographic studies, pulse oximetry and re-evaluation of patient's condition.   Gerhard Munch, MD 08/05/12 1510

## 2012-08-05 NOTE — Progress Notes (Signed)
CARE MANAGEMENT NOTE 08/05/2012  Patient:  [name], [name]   Account Number:  [id number]  Date Initiated:  08/05/2012  Documentation initiated by:  DAVIS,RHONDA  Subjective/Objective Assessment:   pt with hyperglycemia and iv insulin drip     Action/Plan:   home   Anticipated DC Date:  08/08/2012   Anticipated DC Plan:  HOME/SELF CARE  In-house referral  NA      DC Planning Services  NA      [facility] Choice  NA   Choice offered to / List presented to:  NA   DME arranged  NA      DME agency  NA     HH arranged  NA      HH agency  NA   Status of service:  In process, will continue to follow Medicare Important Message given?  NA - LOS <3 / Initial given by admissions (If response is "NO", the following Medicare IM given date fields will be blank) Date Medicare IM given:   Date Additional Medicare IM given:    Discharge Disposition:    Per UR Regulation:  Reviewed for med. necessity/level of care/duration of stay  If discussed at Long Length of Stay Meetings, dates discussed:    Comments:  [id number] Earlene Plater, RN, BSN, CCM: CHART REVIEWED AND UPDATED.  Next chart review due on [id number]. NO DISCHARGE NEEDS PRESENT AT THIS TIME. CASE MANAGEMENT [phone]

## 2012-08-05 NOTE — Progress Notes (Signed)
PT Note  Patient Details Name: [name] MRN: [id number] DOB: August 09, 1966 PT order recieved and chart reviewed.  Pt reported that she had been getting up to ambulte to the bathroom and doing just fine.  She did not feel she needed PT at this time.  Also spoke with pt's CNA who reported pt does well up on her feet. Will sig off at this time. Thank you for the referral.     Marella Bile 08/05/2012, 2:14 PM Marella Bile, PT Pager: [phone] 08/05/2012

## 2012-08-05 NOTE — Progress Notes (Addendum)
[facility] PROGRESS NOTE  [name] WRU:[id number] DOB: [birth year] DOA: 08/04/2012 PCP: [name], MD  Brief narrative: 46 year old female with history of ESRD status post kidney transplant on immunosuppressive therapy, Diabetes mellitus type I, HTN who presented to ED with intractable nausea and vomiting determined to be from DKA.  Assessment and Plan:   Principal Problem:  *DKA, type 1  Anion gap closed and bicarbonate normalized so we will transition to sub Q insulin and per protocol discontinue insulin drip Will switch to normal saline fluids Obtain 1 more BMP before transfer to med-surg floor Diet will be advanced per protocol after insulin drip discontinued Glycemic set in place  Active Problems:  Leukocytosis  Procalcitoni elevated at 0.65 We have started vanco and zosyn empirically Urinalysis not obtained yet; inadequate urine output (will switch to NS fluids) WBC count is now WNL F/U blood culture results CXR shows no acute cardiopulmonary process Anion Gap Metabolic Acidosis  Secondary to DKA  resolved Hypercalcemia  Secondary to dehydration  Continue IV fluids  Calcium now WNL H/O kidney transplant  Continue Cellceptt prednisone and tacrolimus per home regimen HYPERTENSION  Continue Norvasc and Coreg Hypothyroid  Continue levothyroxine Hyperkalemia  Resolved  Hyponatremia  Secondary to dehydration  Resolved    Code Status: full code Family Communication: family not at bedside Disposition Plan: home in next 24-48 hours  [name], MD  [facility] Pager [phone]  Antibiotics:  Vancomycin 08/04/2012 -->  Zosyn 08/04/2012 -->  If 7PM-7AM, please contact night-coverage [email or web address] Password [email or web address] 08/05/2012, 8:15 AM   LOS: 1 day   HPI/Subjective: No acute events overnight.  Objective: Filed Vitals:   08/04/12 2000 08/05/12 0000 08/05/12 0156 08/05/12 0400  BP:   114/44 120/44  Pulse: 109   89  Temp: 98 F (36.7 C)  98.1 F (36.7 C)  98.6 F (37 C)  TempSrc: Oral Oral  Oral  Resp: 21   15  Weight:    106.6 kg (235 lb 0.2 oz)  SpO2: 100%   94%    Intake/Output Summary (Last 24 hours) at 08/05/12 0815 Last data filed at 08/05/12 0700  Gross per 24 hour  Intake 2204.77 ml  Output      0 ml  Net 2204.77 ml    Exam:   General:  Pt is alert, follows commands appropriately, not in acute distress  Cardiovascular: Regular rate and rhythm, S1/S2, no murmurs, no rubs, no gallops  Respiratory: Clear to auscultation bilaterally, no wheezing, no crackles, no rhonchi  Abdomen: Soft, non tender, non distended, bowel sounds present, no guarding  Extremities: No edema, pulses DP and PT palpable bilaterally  Neuro: Grossly nonfocal  Data Reviewed: Basic Metabolic Panel:  Lab 08/05/12 5621 08/04/12 2129 08/04/12 2011 08/04/12 1810 08/04/12 1707  NA 137 134* 134* 130* 131*  K 3.6 4.1 4.7 6.3* 6.0*  CL 103 97 94* 90* 90*  CO2 19 12* 8* 7* 8*  GLUCOSE 132* 456* 565* 750* 778*  BUN 44* 52* 52* 52* 53*  CREATININE 1.46* 1.66* 1.58* 1.47* 1.54*  CALCIUM 9.7 9.9 10.1 10.1 10.4   Liver Function Tests:  Lab 08/04/12 1250  AST 15  ALT 15  ALKPHOS 162*  BILITOT 0.6  PROT 7.9  ALBUMIN 3.8    Lab 08/04/12 1250  LIPASE 9*  AMYLASE --   CBC:  Lab 08/05/12 0535 08/04/12 1707 08/04/12 1250  WBC 8.8 15.0* 11.9*  HGB 13.4 14.2 14.8  HCT 40.4 44.7 46.4*  MCV 85.4 90.7  90.6  PLT 184 193 213   CBG:  Lab 08/05/12 0452 08/05/12 0338 08/05/12 0241 08/05/12 0136 08/05/12 0002  GLUCAP 127* 110* 142* 185* 236*    Recent Results (from the past 240 hour(s))  MRSA PCR SCREENING     Status: Abnormal   Collection Time   08/04/12  5:21 PM      Component Value Range Status Comment   MRSA by PCR POSITIVE (*) NEGATIVE Final      Studies: Dg Chest Port 1 View 08/04/2012    IMPRESSION: No evidence of active cardiopulmonary disease.   Original Report Authenticated By: Harmon Pier, M.D.     Scheduled  Meds:   . amLODipine  5 mg Oral Daily  . carvedilol  50 mg Oral BID WC  . famotidine  20 mg Oral Daily  . insulin aspart  0-15 Units Subcutaneous TID WC  . insulin aspart  0-5 Units Subcutaneous QHS  . insulin glargine  10 Units Subcutaneous QHS  . levothyroxine  137 mcg Oral QAC breakfast  . mycophenolate  500 mg Oral BID  . piperacillin-tazobactam  3.375 g Intravenous Q8H  . predniSONE  5 mg Oral Daily  . sodium chloride  3 mL Intravenous Q12H  . tacrolimus  1 mg Oral BID  . vancomycin  1,500 mg Intravenous Q24H

## 2012-08-06 DIAGNOSIS — D899 Disorder involving the immune mechanism, unspecified: Secondary | ICD-10-CM

## 2012-08-06 DIAGNOSIS — E039 Hypothyroidism, unspecified: Secondary | ICD-10-CM

## 2012-08-06 LAB — BASIC METABOLIC PANEL
BUN: 38 mg/dL — ABNORMAL HIGH (ref 6–23)
BUN: 32 mg/dL — ABNORMAL HIGH (ref 6–23)
BUN: 26 mg/dL — ABNORMAL HIGH (ref 6–23)
CO2: 20 mEq/L (ref 19–32)
CO2: 20 mEq/L (ref 19–32)
CO2: 22 mEq/L (ref 19–32)
Calcium: 10.2 mg/dL (ref 8.4–10.5)
Calcium: 9.9 mg/dL (ref 8.4–10.5)
Chloride: 100 mEq/L (ref 96–112)
Chloride: 100 mEq/L (ref 96–112)
Chloride: 100 mEq/L (ref 96–112)
Chloride: 102 mEq/L (ref 96–112)
Creatinine, Ser: 1.56 mg/dL — ABNORMAL HIGH (ref 0.50–1.10)
Creatinine, Ser: 1.35 mg/dL — ABNORMAL HIGH (ref 0.50–1.10)
GFR calc Af Amer: 45 mL/min — ABNORMAL LOW (ref >90)
GFR calc Af Amer: 51 mL/min — ABNORMAL LOW (ref >90)
GFR calc non Af Amer: 39 mL/min — ABNORMAL LOW (ref >90)
Glucose, Bld: 361 mg/dL — ABNORMAL HIGH (ref 70–99)
Glucose, Bld: 292 mg/dL — ABNORMAL HIGH (ref 70–99)
Glucose, Bld: 227 mg/dL — ABNORMAL HIGH (ref 70–99)
Potassium: 3.8 mEq/L (ref 3.5–5.1)
Potassium: 4 mEq/L (ref 3.5–5.1)
Potassium: 3.8 mEq/L (ref 3.5–5.1)
Potassium: 3.3 mEq/L — ABNORMAL LOW (ref 3.5–5.1)
Sodium: 132 mEq/L — ABNORMAL LOW (ref 135–145)
Sodium: 132 mEq/L — ABNORMAL LOW (ref 135–145)
Sodium: 134 mEq/L — ABNORMAL LOW (ref 135–145)

## 2012-08-06 LAB — GLUCOSE, CAPILLARY
Glucose-Capillary: 252 mg/dL — ABNORMAL HIGH (ref 70–99)
Glucose-Capillary: 232 mg/dL — ABNORMAL HIGH (ref 70–99)
Glucose-Capillary: 213 mg/dL — ABNORMAL HIGH (ref 70–99)
Glucose-Capillary: 162 mg/dL — ABNORMAL HIGH (ref 70–99)

## 2012-08-06 MED ORDER — DEXTROSE-NACL 5-0.45 % IV SOLN
INTRAVENOUS | Status: DC
  Administered 2012-08-06: 21:00:00 via INTRAVENOUS

## 2012-08-06 MED ORDER — INFLUENZA VIRUS VACC SPLIT PF IM SUSP: 0.5000 mL | INTRAMUSCULAR | Status: DC

## 2012-08-06 MED ORDER — SODIUM CHLORIDE 0.9 % IV SOLN: INTRAVENOUS | Status: AC

## 2012-08-06 MED ORDER — MENTHOL 3 MG MT LOZG
1.0000 | LOZENGE | OROMUCOSAL | Status: DC | PRN
  Administered 2012-08-08: 3 mg via ORAL
  Filled 2012-08-06: qty 9

## 2012-08-06 MED ORDER — DEXTROSE 50 % IV SOLN: 25.0000 mL | INTRAVENOUS | Status: DC | PRN

## 2012-08-06 MED ORDER — FLUCONAZOLE IN SODIUM CHLORIDE 200-0.9 MG/100ML-% IV SOLN
200.0000 mg | INTRAVENOUS | Status: DC
  Administered 2012-08-06 – 2012-08-08 (×3): 200 mg via INTRAVENOUS
  Filled 2012-08-06 (×4): qty 100

## 2012-08-06 MED ORDER — INSULIN REGULAR HUMAN 100 UNIT/ML IJ SOLN
INTRAMUSCULAR | Status: DC
  Administered 2012-08-06: 2.8 [IU]/h via INTRAVENOUS
  Filled 2012-08-06: qty 1

## 2012-08-06 MED ORDER — SODIUM CHLORIDE 0.9 % IV SOLN
INTRAVENOUS | Status: DC
  Administered 2012-08-06: 75 mL/h via INTRAVENOUS
  Administered 2012-08-06: 15:00:00 via INTRAVENOUS

## 2012-08-06 NOTE — Progress Notes (Signed)
[facility] PROGRESS NOTE  [name] QMV:[id number] DOB: 1965/12/21 DOA: 08/04/2012 PCP: [name], MD  Brief narrative: 46 year old female with history of ESRD status post kidney transplant on immunosuppressive therapy, Diabetes mellitus type I, HTN who presented to ED with intractable nausea and vomiting determined to be from DKA.   Assessment and Plan:   Principal Problem:  *DKA, type 1  Anion gap elevated again; may need short course of insulin drip today, will see how CBG's are today Continue normal saline Follow up BMP this morning  Active Problems:  Leukocytosis  Procalcitoni elevated at 0.65  We have started vanco and zosyn empirically  Urinalysis shows yeast but no bacteria We will start fluconazole today WBC count is WNL  F/U blood culture results - no growth to date CXR shows no acute cardiopulmonary process Anion Gap Metabolic Acidosis  Secondary to DKA  Patient was off of insulin drip as AGMA resolved but now up again at 25 We will see how CBG's are but the patient may need short course of insulin drip Hypercalcemia  Secondary to dehydration  Resolved with IV fluids H/O kidney transplant  Continue Cellcept, prednisone and tacrolimus per home regimen HYPERTENSION  Continue Norvasc and Coreg Hypothyroid  Continue levothyroxine Hyperkalemia  Resolved  Hyponatremia  Secondary to dehydration  Resolved   Code Status: full code  Family Communication: family not at bedside  Disposition Plan: home in next 24-48 hours   [name], MD  [facility]  Pager [phone]   Antibiotics:  Vancomycin 08/04/2012 --> 08/06/2012 Zosyn 08/04/2012 --> 08/06/2012  If 7PM-7AM, please contact night-coverage [email or web address] Password [email or web address] 08/06/2012, 9:17 AM   LOS: 2 days   HPI/Subjective: Has poor appetite today.   Objective: Filed Vitals:   08/06/12 0154 08/06/12 0536 08/06/12 0602 08/06/12 0716  BP: 118/57 139/70    Pulse: 80 83    Temp: 98.6 F  (37 C) 97.6 F (36.4 C)    TempSrc: Oral Axillary    Resp: 18 20    Height:      Weight:   109.907 kg (242 lb 4.8 oz) 104.645 kg (230 lb 11.2 oz)  SpO2: 94% 100%      Intake/Output Summary (Last 24 hours) at 08/06/12 0917 Last data filed at 08/05/12 1825  Gross per 24 hour  Intake    580 ml  Output      0 ml  Net    580 ml    Exam:   General:  Pt is alert, follows commands appropriately, not in acute distress  Cardiovascular: Regular rate and rhythm, S1/S2, no murmurs, no rubs, no gallops  Respiratory: Clear to auscultation bilaterally, no wheezing, no crackles, no rhonchi  Abdomen: Soft, non tender, non distended, bowel sounds present, no guarding  Extremities: No edema, pulses DP and PT palpable bilaterally  Neuro: Grossly nonfocal  Data Reviewed: Basic Metabolic Panel:  Lab 08/06/12 4010 08/05/12 0840 08/05/12 0535 08/04/12 2129 08/04/12 2011  NA 135 137 137 134* 134*  K 4.5 3.5 3.6 4.1 4.7  CL 100 103 103 97 94*  CO2 10* 20 19 12* 8*  GLUCOSE 469* 177* 132* 456* 565*  BUN 38* 43* 44* 52* 52*  CREATININE 1.56* 1.48* 1.46* 1.66* 1.58*  CALCIUM 10.2 10.1 9.7 9.9 10.1   Liver Function Tests:  Lab 08/04/12 1250  AST 15  ALT 15  ALKPHOS 162*  BILITOT 0.6  PROT 7.9  ALBUMIN 3.8    Lab 08/04/12 1250  LIPASE 9*  AMYLASE --  CBC:  Lab 08/05/12 0535 08/04/12 1707 08/04/12 1250  WBC 8.8 15.0* 11.9*  HGB 13.4 14.2 14.8  HCT 40.4 44.7 46.4*  MCV 85.4 90.7 90.6  PLT 184 193 213   CBG:  Lab 08/06/12 0759 08/05/12 2203 08/05/12 1649 08/05/12 1331 08/05/12 0820  GLUCAP 417* 176* 353* 393* 179*    MRSA PCR SCREENING     Status: Abnormal   Collection Time   08/04/12  5:21 PM      Component Value Range Status Comment   MRSA by PCR POSITIVE (*) NEGATIVE Final   CULTURE, BLOOD (ROUTINE X 2)     Status: Normal (Preliminary result)   Collection Time   08/04/12  6:00 PM      Component Value Range Status Comment   Culture     Final    Value:         BLOOD CULTURE RECEIVED NO GROWTH TO DATE    Report Status PENDING   Incomplete   CULTURE, BLOOD (ROUTINE X 2)     Status: Normal (Preliminary result)   Collection Time   08/04/12  6:10 PM      Component Value Range Status Comment   Culture     Final    Value:        BLOOD CULTURE RECEIVED NO GROWTH TO DATE    Report Status PENDING   Incomplete      Studies: Dg Chest Port 1 View 08/04/2012  * IMPRESSION: No evidence of active cardiopulmonary disease.    Scheduled Meds:  . amLODipine  5 mg Oral Daily  . carvedilol  50 mg Oral BID WC  . famotidine  20 mg Oral Daily  . insulin aspart  0-15 Units Subcutaneous TID WC  . insulin aspart  0-5 Units Subcutaneous QHS  . levothyroxine  137 mcg Oral QAC breakfast  . mupirocin ointment  1 application Nasal BID  . mycophenolate  500 mg Oral BID  . predniSONE  5 mg Oral Daily  . tacrolimus  1 mg Oral BID   Continuous Infusions:  . sodium chloride 100 mL/hr at 08/05/12 1711

## 2012-08-06 NOTE — Progress Notes (Signed)
ANTIBIOTIC CONSULT NOTE - INITIAL  Pharmacy Consult for Diflucan Indication: yeast UTI  Allergies  Allergen Reactions  . Ace Inhibitors Other (See Comments)    cough  . Adhesive (Tape) Itching    Patient Measurements: Height: 5\' 7"  (170.2 cm) Weight: 230 lb 11.2 oz (104.645 kg) IBW/kg (Calculated) : 61.6   Vital Signs: Temp: 97.6 F (36.4 C) (11/30 0536) Temp src: Axillary (11/30 0536) BP: 139/70 mmHg (11/30 0536) Pulse Rate: 83  (11/30 0536) Intake/Output from previous day: 11/29 0701 - 11/30 0700 In: 755 [P.O.:480; I.V.:275] Out: -  Intake/Output from this shift:    Labs:  Basename 08/06/12 0741 08/05/12 0840 08/05/12 0535 08/04/12 1707 08/04/12 1250  WBC -- -- 8.8 15.0* 11.9*  HGB -- -- 13.4 14.2 14.8  PLT -- -- 184 193 213  LABCREA -- -- -- -- --  CREATININE 1.56* 1.48* 1.46* -- --   Estimated Creatinine Clearance: 56.7 ml/min (by C-G formula based on Cr of 1.56). No results found for this basename: VANCOTROUGH:2,VANCOPEAK:2,VANCORANDOM:2,GENTTROUGH:2,GENTPEAK:2,GENTRANDOM:2,TOBRATROUGH:2,TOBRAPEAK:2,TOBRARND:2,AMIKACINPEAK:2,AMIKACINTROU:2,AMIKACIN:2, in the last 72 hours   Microbiology: Recent Results (from the past 720 hour(s))  MRSA PCR SCREENING     Status: Abnormal   Collection Time   08/04/12  5:21 PM      Component Value Range Status Comment   MRSA by PCR POSITIVE (*) NEGATIVE Final   CULTURE, BLOOD (ROUTINE X 2)     Status: Normal (Preliminary result)   Collection Time   08/04/12  6:00 PM      Component Value Range Status Comment   Specimen Description BLOOD LEFT ARM   Final    Special Requests BOTTLES DRAWN AEROBIC ONLY 6 CC   Final    Culture  Setup Time 08/04/2012 21:59   Final    Culture     Final    Value:        BLOOD CULTURE RECEIVED NO GROWTH TO DATE CULTURE WILL BE HELD FOR 5 DAYS BEFORE ISSUING A FINAL NEGATIVE REPORT   Report Status PENDING   Incomplete   CULTURE, BLOOD (ROUTINE X 2)     Status: Normal (Preliminary result)   Collection Time   08/04/12  6:10 PM      Component Value Range Status Comment   Specimen Description BLOOD RIGHT HAND   Final    Special Requests BOTTLES DRAWN AEROBIC ONLY 6 CC   Final    Culture  Setup Time 08/04/2012 21:59   Final    Culture     Final    Value:        BLOOD CULTURE RECEIVED NO GROWTH TO DATE CULTURE WILL BE HELD FOR 5 DAYS BEFORE ISSUING A FINAL NEGATIVE REPORT   Report Status PENDING   Incomplete     Medical History: Past Medical History  Diagnosis Date  . Hypertension   . Hypothyroid   . Depression   . 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 Kidney disease   . End stage renal disease     right arm AV graft, post transplant  . Diabetes mellitus     Insulin dependant    Medications:  Scheduled:    . amLODipine  5 mg Oral Daily  . carvedilol  50 mg Oral BID WC  . Chlorhexidine Gluconate Cloth  6 each Topical Q0600  . famotidine  20 mg Oral Daily  . influenza  inactive virus vaccine  0.5 mL Intramuscular Tomorrow-1000  . insulin aspart  0-15 Units Subcutaneous TID WC  .  insulin aspart  0-5 Units Subcutaneous QHS  . levothyroxine  137 mcg Oral QAC breakfast  . mupirocin ointment  1 application Nasal BID  . mycophenolate  500 mg Oral BID  . predniSONE  5 mg Oral Daily  . sodium chloride  3 mL Intravenous Q12H  . tacrolimus  1 mg Oral BID  . [DISCONTINUED] piperacillin-tazobactam (ZOSYN)  IV  3.375 g Intravenous Q8H  . [DISCONTINUED] vancomycin  1,500 mg Intravenous Q24H   Infusions:    . sodium chloride 100 mL/hr at 08/05/12 1711   Assessment: 46 yo already started on empiric Vanc/Zosyn originally but urine culture grew yeast so Md changed to Diflucan per Rx dosing  Goal of Therapy:  eradication of infection  Plan:  Diflucan 200mg  IV daily   Hessie Knows, PharmD, BCPS Pager [phone] 08/06/2012 9:40 AM

## 2012-08-07 DIAGNOSIS — E871 Hypo-osmolality and hyponatremia: Secondary | ICD-10-CM

## 2012-08-07 LAB — BASIC METABOLIC PANEL
BUN: 19 mg/dL (ref 6–23)
Calcium: 9.9 mg/dL (ref 8.4–10.5)
Calcium: 10.3 mg/dL (ref 8.4–10.5)
Creatinine, Ser: 1.04 mg/dL (ref 0.50–1.10)
Creatinine, Ser: 1.01 mg/dL (ref 0.50–1.10)
GFR calc Af Amer: 69 mL/min — ABNORMAL LOW (ref >90)
GFR calc Af Amer: 74 mL/min — ABNORMAL LOW (ref >90)
GFR calc Af Amer: 77 mL/min — ABNORMAL LOW (ref >90)
GFR calc non Af Amer: 60 mL/min — ABNORMAL LOW (ref >90)
GFR calc non Af Amer: 64 mL/min — ABNORMAL LOW (ref >90)
Glucose, Bld: 162 mg/dL — ABNORMAL HIGH (ref 70–99)
Potassium: 3.3 mEq/L — ABNORMAL LOW (ref 3.5–5.1)
Potassium: 3.3 mEq/L — ABNORMAL LOW (ref 3.5–5.1)
Sodium: 134 mEq/L — ABNORMAL LOW (ref 135–145)

## 2012-08-07 LAB — GLUCOSE, CAPILLARY
Glucose-Capillary: 132 mg/dL — ABNORMAL HIGH (ref 70–99)
Glucose-Capillary: 146 mg/dL — ABNORMAL HIGH (ref 70–99)
Glucose-Capillary: 166 mg/dL — ABNORMAL HIGH (ref 70–99)
Glucose-Capillary: 339 mg/dL — ABNORMAL HIGH (ref 70–99)
Glucose-Capillary: 204 mg/dL — ABNORMAL HIGH (ref 70–99)

## 2012-08-07 LAB — URINE CULTURE: Colony Count: NO GROWTH

## 2012-08-07 MED ORDER — INSULIN ASPART 100 UNIT/ML ~~LOC~~ SOLN: 0.0000 [IU] | Freq: Every day | SUBCUTANEOUS | Status: DC

## 2012-08-07 MED ORDER — INSULIN ASPART 100 UNIT/ML ~~LOC~~ SOLN
0.0000 [IU] | Freq: Three times a day (TID) | SUBCUTANEOUS | Status: DC
  Administered 2012-08-07: 7 [IU] via SUBCUTANEOUS
  Administered 2012-08-07: 2 [IU] via SUBCUTANEOUS
  Administered 2012-08-07: 15 [IU] via SUBCUTANEOUS
  Administered 2012-08-08: 20 [IU] via SUBCUTANEOUS
  Administered 2012-08-08: 4 [IU] via SUBCUTANEOUS

## 2012-08-07 MED ORDER — POTASSIUM CHLORIDE CRYS ER 20 MEQ PO TBCR
40.0000 meq | EXTENDED_RELEASE_TABLET | Freq: Once | ORAL | Status: DC
  Filled 2012-08-07: qty 2

## 2012-08-07 MED ORDER — INSULIN GLARGINE 100 UNIT/ML ~~LOC~~ SOLN: 30.0000 [IU] | Freq: Every day | SUBCUTANEOUS | Status: DC

## 2012-08-07 MED ORDER — INSULIN GLARGINE 100 UNIT/ML ~~LOC~~ SOLN
10.0000 [IU] | Freq: Every day | SUBCUTANEOUS | Status: DC
  Administered 2012-08-07: 10 [IU] via SUBCUTANEOUS

## 2012-08-07 NOTE — Progress Notes (Addendum)
Inpatient Diabetes Program Recommendations  AACE/ADA: New Consensus Statement on Inpatient Glycemic Control (2013)  Target Ranges:  Prepandial:   less than 140 mg/dL      Peak postprandial:   less than 180 mg/dL (1-2 hours)      Critically ill patients:  140 - 180 mg/dL   Reason for Visit: potential for DKA recurrence Hyperglycemia in 300's again. Please recheck B-met stat and continue q 4-6 hrs today.  Inpatient Diabetes Program Recommendations Insulin - Basal: Pt takes lantus 40 units in the am and 30 units at HS.  Pt on 10 units here.  Pt was on 10 units yesterday when DKA recurred.  Please increase pt's lantus dose to 30 units to start. Can add 20 units to tonight's dose. Pt would benefit from meal coverage of 3 units to start tidwc and decrease correction to sensitive tidwc and HS scale.  Note: Thank you, Lenor Coffin, RN, CNS, Diabetes Coordinator [phone])  AD Called RN to notify and request MD to please order another BMet at this time due to so little lantus given this am and glucose back up into the 300's (as was pattern yesterday when DKA recurred, although GAP had not closed when drip was d/cd the first time).

## 2012-08-07 NOTE — Progress Notes (Signed)
[facility] PROGRESS NOTE  KACY HEGNA WUJ:[id number] DOB: 1966-06-23 DOA: 08/04/2012 PCP: [name], MD  Brief narrative: 46 year old female with history of ESRD status post kidney transplant on immunosuppressive therapy, Diabetes mellitus type I, HTN who presented to ED with intractable nausea and vomiting determined to be from DKA.   Assessment and Plan:   Principal Problem:  *DKA, type 1  Anion gap now WNL Bicarbonate WNL Insulin drip stopped We will continue normal saline Follow up BMP in am Diet now carb modified  Active Problems:  Leukocytosis  Procalcitoni elevated at 0.65  We have started vanco and zosyn empirically  Urinalysis shows yeast but no bacteria  We started fluconazole but discontinued vanco and zosyn WBC count is WNL  F/U blood culture results - no growth to date  CXR shows no acute cardiopulmonary process Anion Gap Metabolic Acidosis  Secondary to DKA Resolved  Patient off of insulin drip  CBG's: 146, 166 Hypercalcemia  Secondary to dehydration  Resolved with IV fluids H/O kidney transplant  Continue Cellcept, prednisone and tacrolimus per home regimen HYPERTENSION  Continue Norvasc and Coreg Hypothyroid  Continue levothyroxine Hyperkalemia  Resolved  Hyponatremia  Secondary to dehydration  Resolved   Code Status: full code  Family Communication: family at bedside  Disposition Plan: home in next 24 hours  [name], MD  [facility]  Pager [phone]   Antibiotics:  Vancomycin 08/04/2012 --> 08/06/2012  Zosyn 08/04/2012 --> 08/06/2012 Fluconazole 08/06/2012 -->  If 7PM-7AM, please contact night-coverage [email or web address] Password [facility] 08/07/2012, 11:31 AM   LOS: 3 days   HPI/Subjective: No acute overnight events.  Objective: Filed Vitals:   08/06/12 2124 08/07/12 0216 08/07/12 0500 08/07/12 0619  BP: 146/65 145/66  142/65  Pulse: 76 66  65  Temp: 98.3 F (36.8 C) 97.9 F (36.6 C)  97.9 F (36.6 C)  TempSrc:  Oral Oral  Oral  Resp: 18 18  18   Height:      Weight:   107.956 kg (238 lb)   SpO2: 95% 93%  92%    Intake/Output Summary (Last 24 hours) at 08/07/12 1131 Last data filed at 08/07/12 0500  Gross per 24 hour  Intake   1040 ml  Output   1100 ml  Net    -60 ml    Exam:   General:  Pt is alert, follows commands appropriately, not in acute distress  Cardiovascular: Regular rate and rhythm, S1/S2, no murmurs, no rubs, no gallops  Respiratory: Clear to auscultation bilaterally, no wheezing, no crackles, no rhonchi  Abdomen: Soft, non tender, non distended, bowel sounds present, no guarding  Extremities: No edema, pulses DP and PT palpable bilaterally  Neuro: Grossly nonfocal  Data Reviewed: Basic Metabolic Panel:  Lab 08/07/12 8657 08/07/12 0406 08/06/12 2200 08/06/12 2000 08/06/12 1750  NA 136 137 134* 132* 131*  K 3.3* 3.3* 3.3* 3.8 4.0  CL 104 104 102 100 100  CO2 23 23 22 20 20   GLUCOSE 129* 162* 227* 292* 337*  BUN 19 22 26* 28* 30*  CREATININE 1.04 1.10 1.25* 1.27* 1.35*  CALCIUM 9.9 9.9 10.1 9.9 10.1   Liver Function Tests:  Lab 08/04/12 1250  AST 15  ALT 15  ALKPHOS 162*  BILITOT 0.6  PROT 7.9  ALBUMIN 3.8    Lab 08/04/12 1250  LIPASE 9*  AMYLASE --   CBC:  Lab 08/05/12 0535 08/04/12 1707 08/04/12 1250  WBC 8.8 15.0* 11.9*  HGB 13.4 14.2 14.8  HCT 40.4 44.7  46.4*  MCV 85.4 90.7 90.6  PLT 184 193 213   CBG:  Lab 08/07/12 0611 08/07/12 0510 08/07/12 0418 08/07/12 0319 08/07/12 0206  GLUCAP 146* 166* 155* 146* 132*    Recent Results (from the past 240 hour(s))  MRSA PCR SCREENING     Status: Abnormal   Collection Time   08/04/12  5:21 PM      Component Value Range Status Comment   MRSA by PCR POSITIVE (*) NEGATIVE Final   CULTURE, BLOOD (ROUTINE X 2)     Status: Normal (Preliminary result)   Collection Time   08/04/12  6:00 PM      Component Value Range Status Comment   Specimen Description BLOOD LEFT ARM   Final    Special Requests  BOTTLES DRAWN AEROBIC ONLY 6 CC   Final    Culture  Setup Time 08/04/2012 21:59   Final    Culture     Final    Value:        BLOOD CULTURE RECEIVED NO GROWTH TO DATE CULTURE WILL BE HELD FOR 5 DAYS BEFORE ISSUING A FINAL NEGATIVE REPORT   Report Status PENDING   Incomplete   CULTURE, BLOOD (ROUTINE X 2)     Status: Normal (Preliminary result)   Collection Time   08/04/12  6:10 PM      Component Value Range Status Comment   Specimen Description BLOOD RIGHT HAND   Final    Special Requests BOTTLES DRAWN AEROBIC ONLY 6 CC   Final    Culture  Setup Time 08/04/2012 21:59   Final    Culture     Final    Value:        BLOOD CULTURE RECEIVED NO GROWTH TO DATE CULTURE WILL BE HELD FOR 5 DAYS BEFORE ISSUING A FINAL NEGATIVE REPORT   Report Status PENDING   Incomplete   URINE CULTURE     Status: Normal   Collection Time   08/05/12  9:38 PM      Component Value Range Status Comment   Specimen Description URINE, RANDOM   Final    Special Requests NONE   Final    Culture  Setup Time 08/06/2012 01:32   Final    Colony Count NO GROWTH   Final    Culture NO GROWTH   Final    Report Status 08/07/2012 FINAL   Final      Studies: No results found.  Scheduled Meds:   . amLODipine  5 mg Oral Daily  . carvedilol  50 mg Oral BID WC  . famotidine  20 mg Oral Daily  . fluconazole (DIFLUCAN)  200 mg Intravenous Q24H  . insulin aspart  0-20 Units Subcutaneous TID WC  . insulin aspart  0-5 Units Subcutaneous QHS  . insulin glargine  10 Units Subcutaneous QHS  . levothyroxine  137 mcg Oral QAC breakfast  . mupirocin ointment  1 application Nasal BID  . mycophenolate  500 mg Oral BID  . potassium chloride  40 mEq Oral Once  . predniSONE  5 mg Oral Daily  . tacrolimus  1 mg Oral BID

## 2012-08-08 MED ORDER — CARVEDILOL 25 MG PO TABS: 50.0000 mg | ORAL_TABLET | Freq: Two times a day (BID) | ORAL | Status: DC

## 2012-08-08 MED ORDER — PHENOL 1.4 % MT LIQD: 1.0000 | OROMUCOSAL | Status: DC | PRN

## 2012-08-08 MED ORDER — INSULIN GLARGINE 100 UNIT/ML ~~LOC~~ SOLN: 30.0000 [IU] | SUBCUTANEOUS | Status: DC

## 2012-08-08 MED ORDER — CHLORHEXIDINE GLUCONATE CLOTH 2 % EX PADS: 6.0000 | MEDICATED_PAD | Freq: Every day | CUTANEOUS | Status: DC

## 2012-08-08 MED ORDER — FLUCONAZOLE 100 MG PO TABS: 100.0000 mg | ORAL_TABLET | Freq: Every day | ORAL | Status: DC

## 2012-08-08 MED ORDER — MAGIC MOUTHWASH W/LIDOCAINE: 5.0000 mL | Freq: Four times a day (QID) | ORAL | Status: DC | PRN

## 2012-08-08 MED ORDER — AMLODIPINE BESYLATE 5 MG PO TABS: 5.0000 mg | ORAL_TABLET | Freq: Every day | ORAL | Status: DC

## 2012-08-08 MED ORDER — HYDROCODONE-ACETAMINOPHEN 5-325 MG PO TABS: 1.0000 | ORAL_TABLET | ORAL | Status: DC | PRN

## 2012-08-08 MED ORDER — MENTHOL 3 MG MT LOZG: 1.0000 | LOZENGE | OROMUCOSAL | Status: DC | PRN

## 2012-08-08 NOTE — Care Management Note (Signed)
    Page 1 of 2   08/08/2012     11:47:10 AM   CARE MANAGEMENT NOTE 08/08/2012  Patient:  [name], [name]   Account Number:  [id number]  Date Initiated:  08/05/2012  Documentation initiated by:  DAVIS,RHONDA  Subjective/Objective Assessment:   pt with hyperglycemia and iv insulin drip     Action/Plan:   home   Anticipated DC Date:  08/08/2012   Anticipated DC Plan:  HOME/SELF CARE  In-house referral  NA      DC Planning Services  NA      PAC Choice  NA   Choice offered to / List presented to:  NA   DME arranged  NA      DME agency  NA     HH arranged  NA      HH agency  NA   Status of service:  Completed, signed off Medicare Important Message given?  NA - LOS <3 / Initial given by admissions (If response is "NO", the following Medicare IM given date fields will be blank) Date Medicare IM given:   Date Additional Medicare IM given:    Discharge Disposition:  HOME/SELF CARE  Per UR Regulation:  Reviewed for med. necessity/level of care/duration of stay  If discussed at Long Length of Stay Meetings, dates discussed:    Comments:  [id number] Earlene Plater, RN, BSN, CCM: CHART REVIEWED AND UPDATED.  Next chart review due on [id number]. NO DISCHARGE NEEDS PRESENT AT THIS TIME. CASE MANAGEMENT [phone]

## 2012-08-08 NOTE — Discharge Summary (Signed)
Physician Discharge Summary  [name] ZOX:[id number] DOB: 27-Jul-1966 DOA: 08/04/2012  PCP: [name], MD  Admit date: 08/04/2012 Discharge date: 08/08/2012  Discharge Diagnoses:  Principal Problem:  *DKA, type 1 Active Problems:  High anion gap metabolic acidosis  Sepsis  HYPERTENSION  Immunosuppression  Hypothyroidism  Hyperkalemia  Hyponatremia  H/O kidney transplant  Leukocytosis  Hypercalcemia  Discharge Condition: Medically stable to go home today  Diet recommendation: Carb modified diet  History of present illness:  46 year old female with history of ESRD status post kidney transplant on immunosuppressive therapy, Diabetes mellitus type I, HTN who presented to ED with intractable nausea and vomiting determined to be from DKA.   Assessment and Plan:   Principal Problem:  *DKA, type 1  Anion gap now WNL for more than 24 hour Bicarbonate WNL  Insulin drip stopped and patient was transitioned to subcutaneous insulin This will be the following home regimen: Lantus 40 units in the morning and 30 units at bedtime and NovoLog 3 units 3 times a day a.c.  Active Problems:  Leukocytosis  Procalcitoni elevated at 0.65  We have started vanco and zosyn empirically  Urinalysis shows yeast but no bacteria  We started fluconazole which patient will continue for next 5 days WBC count is WNL  F/U blood culture results - no growth to date  CXR shows no acute cardiopulmonary process Anion Gap Metabolic Acidosis  Secondary to DKA  Resolved  Patient off of insulin drip  Appreciated diabetic coordinator input Hypercalcemia  Secondary to dehydration  Resolved with IV fluids H/O kidney transplant  Continue Cellcept, prednisone and tacrolimus per home regimen HYPERTENSION  Continue Norvasc and Coreg Hypothyroid  Continue levothyroxine Hyperkalemia  Resolved  Hyponatremia  Secondary to dehydration  Resolved   Code Status: full code  Family  Communication: family at bedside  Disposition Plan: home today  [name], MD  [facility]  Pager [phone]   Antibiotics:  Vancomycin 08/04/2012 --> 08/06/2012  Zosyn 08/04/2012 --> 08/06/2012  Fluconazole 08/06/2012 --> patient will continue fluconazole for 5 days upon discharge  Discharge Exam: Filed Vitals:   08/08/12 1000  BP: 150/66  Pulse: 79  Temp: 97.4 F (36.3 C)  Resp: 18   Filed Vitals:   08/07/12 2130 08/08/12 0210 08/08/12 0615 08/08/12 1000  BP: 121/52 141/63 147/55 150/66  Pulse: 70 82 79 79  Temp: 98 F (36.7 C) 97.9 F (36.6 C) 97.9 F (36.6 C) 97.4 F (36.3 C)  TempSrc: Oral Oral Oral Oral  Resp: 18 18 18 18   Height:      Weight:   110.36 kg (243 lb 4.8 oz)   SpO2: 92% 93% 92% 99%    General: Pt is alert, follows commands appropriately, not in acute distress Cardiovascular: Regular rate and rhythm, S1/S2 +, no murmurs, no rubs, no gallops Respiratory: Clear to auscultation bilaterally, no wheezing, no crackles, no rhonchi Abdominal: Soft, non tender, non distended, bowel sounds +, no guarding Extremities: no edema, no cyanosis, pulses palpable bilaterally DP and PT Neuro: Grossly nonfocal  Discharge Instructions  Discharge Orders    Future Orders Please Complete By Expires   Diet - low sodium heart healthy      Increase activity slowly      Call MD for:  persistant nausea and vomiting      Call MD for:  severe uncontrolled pain      Call MD for:  persistant dizziness or light-headedness      Call MD for:  extreme  fatigue          Medication List     As of 08/08/2012  1:01 PM    TAKE these medications         amLODipine 5 MG tablet   Commonly known as: NORVASC   Take 1 tablet (5 mg total) by mouth daily.      carvedilol 25 MG tablet   Commonly known as: COREG   Take 2 tablets (50 mg total) by mouth 2 (two) times daily.      Chlorhexidine Gluconate Cloth 2 % Pads   Apply 6 each topically daily at 6 (six) AM.      fluconazole 100 MG  tablet   Commonly known as: DIFLUCAN   Take 1 tablet (100 mg total) by mouth daily.      HYDROcodone-acetaminophen 5-325 MG per tablet   Commonly known as: NORCO/VICODIN   Take 1-2 tablets by mouth every 4 (four) hours as needed for pain.      insulin aspart 100 UNIT/ML injection   Commonly known as: novoLOG   Inject 0-11 Units into the skin 3 (three) times daily with meals. Sliding scale:  <70 0 units, drink orange juice; 70-120 0 units; 121-150 1 unit; 151-200 2 units; 201-250 3 units; 251-300 5 units; 301-350 7 units; 351-400 9 units; >400 11 units & call MD      insulin glargine 100 UNIT/ML injection   Commonly known as: LANTUS   Inject 30-40 Units into the skin See admin instructions. Takes 40 units in the morning, and 30 units at bedtime      levothyroxine 137 MCG tablet   Commonly known as: SYNTHROID, LEVOTHROID   Take 137 mcg by mouth daily. Name brand      magic mouthwash w/lidocaine Soln   Take 5 mLs by mouth 4 (four) times daily as needed.      menthol-cetylpyridinium 3 MG lozenge   Commonly known as: CEPACOL   Take 1 lozenge (3 mg total) by mouth as needed for pain.      mycophenolate 250 MG capsule   Commonly known as: CELLCEPT   Take 500 mg by mouth 2 (two) times daily.      phenol 1.4 % Liqd   Commonly known as: CHLORASEPTIC   Use as directed 1 spray in the mouth or throat as needed.      predniSONE 5 MG tablet   Commonly known as: DELTASONE   Take 5 mg by mouth daily.      ranitidine 150 MG capsule   Commonly known as: ZANTAC   Take 150 mg by mouth at bedtime.      tacrolimus 1 MG capsule   Commonly known as: PROGRAF   Take 1 mg by mouth 2 (two) times daily.           Follow-up Information    Follow up with COLADONATO,JOSEPH A, MD. In 2 weeks. (If symptoms worsen)    Contact information:   [address] [facility] [place] [address] [phone]           The results of significant diagnostics from this hospitalization  (including imaging, microbiology, ancillary and laboratory) are listed below for reference.    Significant Diagnostic Studies: Dg Chest Port 1 View  08/04/2012  *RADIOLOGY REPORT*  Clinical Data: 47 year old female with chest pain and leukocytosis.  PORTABLE CHEST - 1 VIEW  Comparison: 11/20/2011 and prior chest radiographs dating back to 08/04/2003  Findings: The cardiomediastinal silhouette is unremarkable.  There is no evidence of focal airspace disease, pulmonary edema, suspicious pulmonary nodule/mass, pleural effusion, or pneumothorax. No acute bony abnormalities are identified.  IMPRESSION: No evidence of active cardiopulmonary disease.   Original Report Authenticated By: Harmon Pier, M.D.     Microbiology: Recent Results (from the past 240 hour(s))  MRSA PCR SCREENING     Status: Abnormal   Collection Time   08/04/12  5:21 PM      Component Value Range Status Comment   MRSA by PCR POSITIVE (*) NEGATIVE Final   CULTURE, BLOOD (ROUTINE X 2)     Status: Normal (Preliminary result)   Collection Time   08/04/12  6:00 PM      Component Value Range Status Comment   Specimen Description BLOOD LEFT ARM   Final    Special Requests BOTTLES DRAWN AEROBIC ONLY 6 CC   Final    Culture  Setup Time 08/04/2012 21:59   Final    Culture     Final    Value:        BLOOD CULTURE RECEIVED NO GROWTH TO DATE CULTURE WILL BE HELD FOR 5 DAYS BEFORE ISSUING A FINAL NEGATIVE REPORT   Report Status PENDING   Incomplete   CULTURE, BLOOD (ROUTINE X 2)     Status: Normal (Preliminary result)   Collection Time   08/04/12  6:10 PM      Component Value Range Status Comment   Specimen Description BLOOD RIGHT HAND   Final    Special Requests BOTTLES DRAWN AEROBIC ONLY 6 CC   Final    Culture  Setup Time 08/04/2012 21:59   Final    Culture     Final    Value:        BLOOD CULTURE RECEIVED NO GROWTH TO DATE CULTURE WILL BE HELD FOR 5 DAYS BEFORE ISSUING A FINAL NEGATIVE REPORT   Report Status PENDING   Incomplete    URINE CULTURE     Status: Normal   Collection Time   08/05/12  9:38 PM      Component Value Range Status Comment   Specimen Description URINE, RANDOM   Final    Special Requests NONE   Final    Culture  Setup Time 08/06/2012 01:32   Final    Colony Count NO GROWTH   Final    Culture NO GROWTH   Final    Report Status 08/07/2012 FINAL   Final      Labs: Basic Metabolic Panel:  Lab 08/07/12 2841 08/07/12 0713 08/07/12 0406 08/06/12 2200 08/06/12 2000  NA 134* 136 137 134* 132*  K 4.1 3.3* 3.3* 3.3* 3.8  CL 103 104 104 102 100  CO2 21 23 23 22 20   GLUCOSE 245* 129* 162* 227* 292*  BUN 17 19 22  26* 28*  CREATININE 1.01 1.04 1.10 1.25* 1.27*  CALCIUM 10.3 9.9 9.9 10.1 9.9   Liver Function Tests:  Lab 08/04/12 1250  AST 15  ALT 15  ALKPHOS 162*  BILITOT 0.6  PROT 7.9  ALBUMIN 3.8    Lab 08/04/12 1250  LIPASE 9*  AMYLASE --   CBC:  Lab 08/05/12 0535 08/04/12 1707 08/04/12 1250  WBC 8.8 15.0* 11.9*  HGB 13.4 14.2 14.8  HCT 40.4 44.7 46.4*  MCV 85.4 90.7 90.6  PLT 184 193 213   CBG:  Lab 08/08/12 1156 08/08/12 0714 08/07/12 2123 08/07/12 1722 08/07/12 1126  GLUCAP 182* 360* 157* 204* 339*    Time coordinating discharge: Over  30 minutes  Signed:  [name], MD  [facility] 08/08/2012, 1:01 PM  Pager #: [phone]

## 2012-08-08 NOTE — Progress Notes (Signed)
Pt with husband at bedside given d/c instructions using teachback including when to call MD, f/u appts, meds--pt and husband verbalized understanding and asked appropriate questions.  Pt denied wanting any additional printouts or booklets regarding diabetes or DKA. Able to state s/s hypo/hyperglycemia.  Belongings packed, prescriptions provided. D/C home via wheelchair.

## 2012-08-10 LAB — CULTURE, BLOOD (ROUTINE X 2): Culture: NO GROWTH

## 2012-10-07 IMAGING — CR DG TIBIA/FIBULA 2V*L*
3 series · 3 of 3 positions shown · non-contrast
Comparison: None

CLINICAL DATA: Left lower leg pain and swelling.

LEFT TIBIA AND FIBULA - 2 VIEW

[view not recorded (1 of 3)]
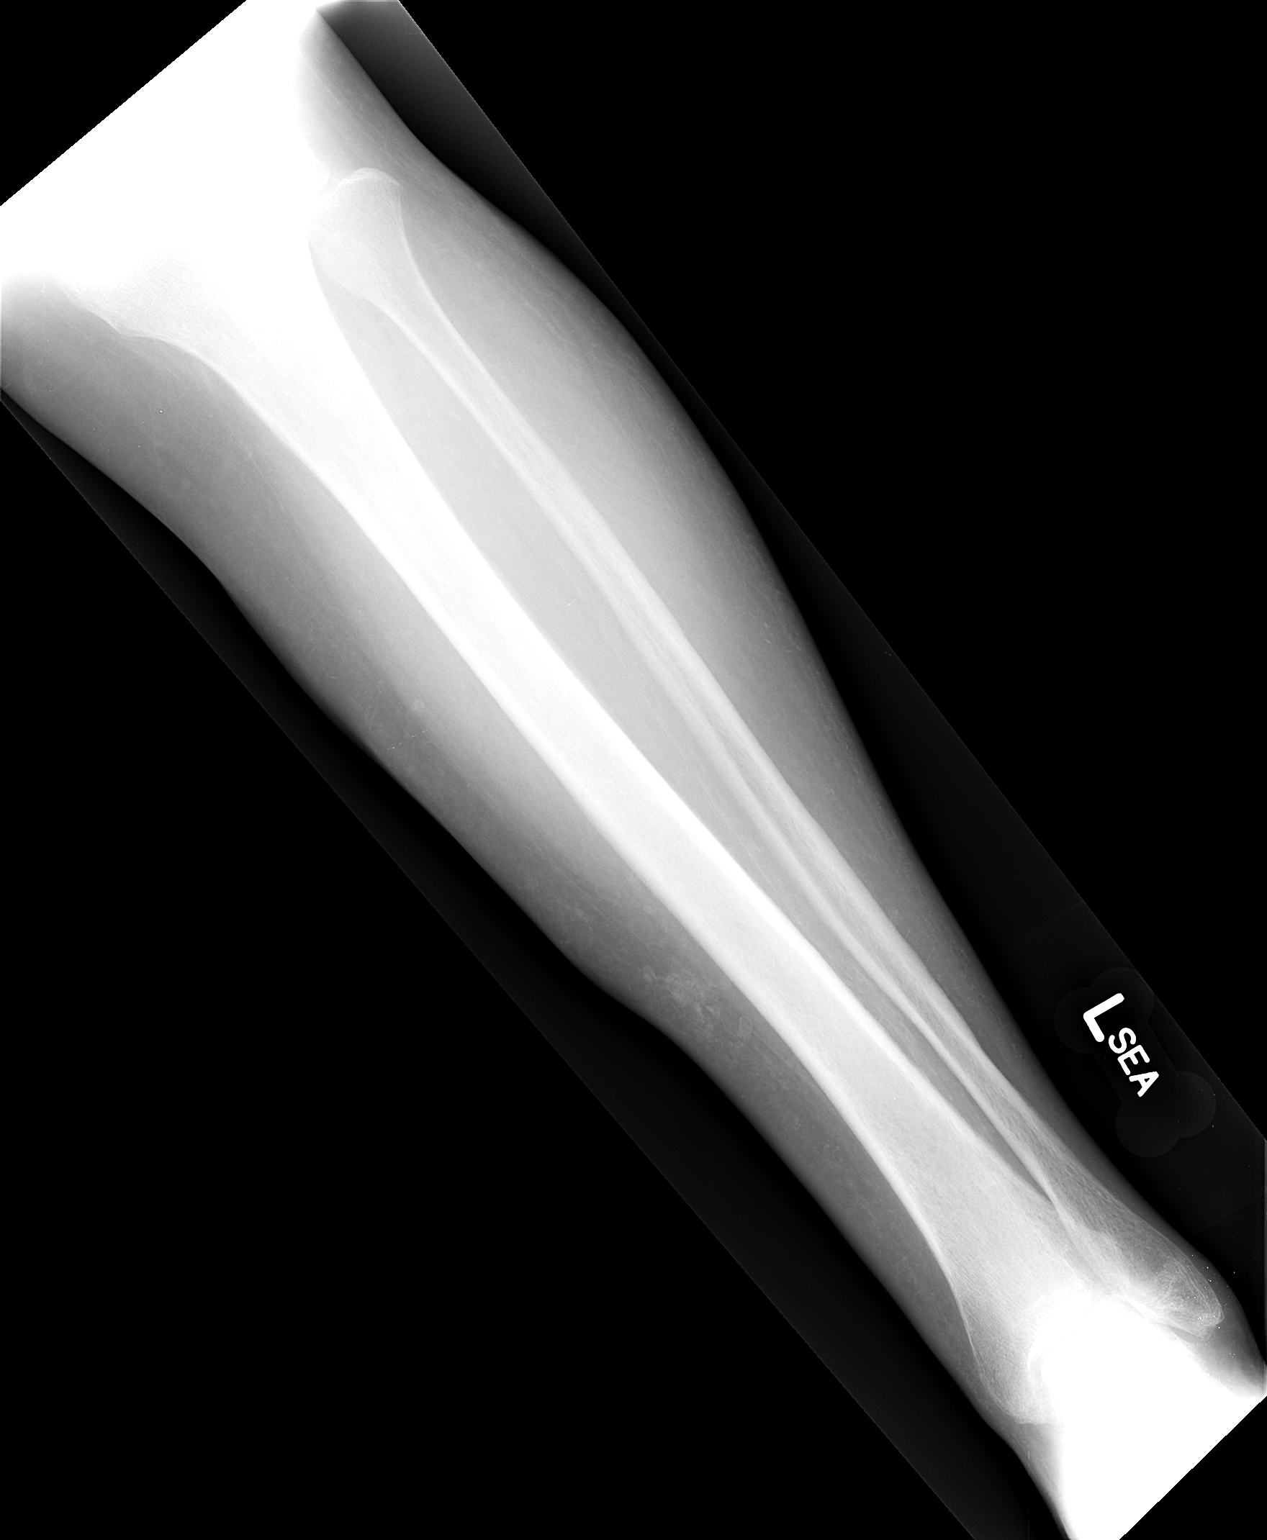

[view not recorded (2 of 3)]
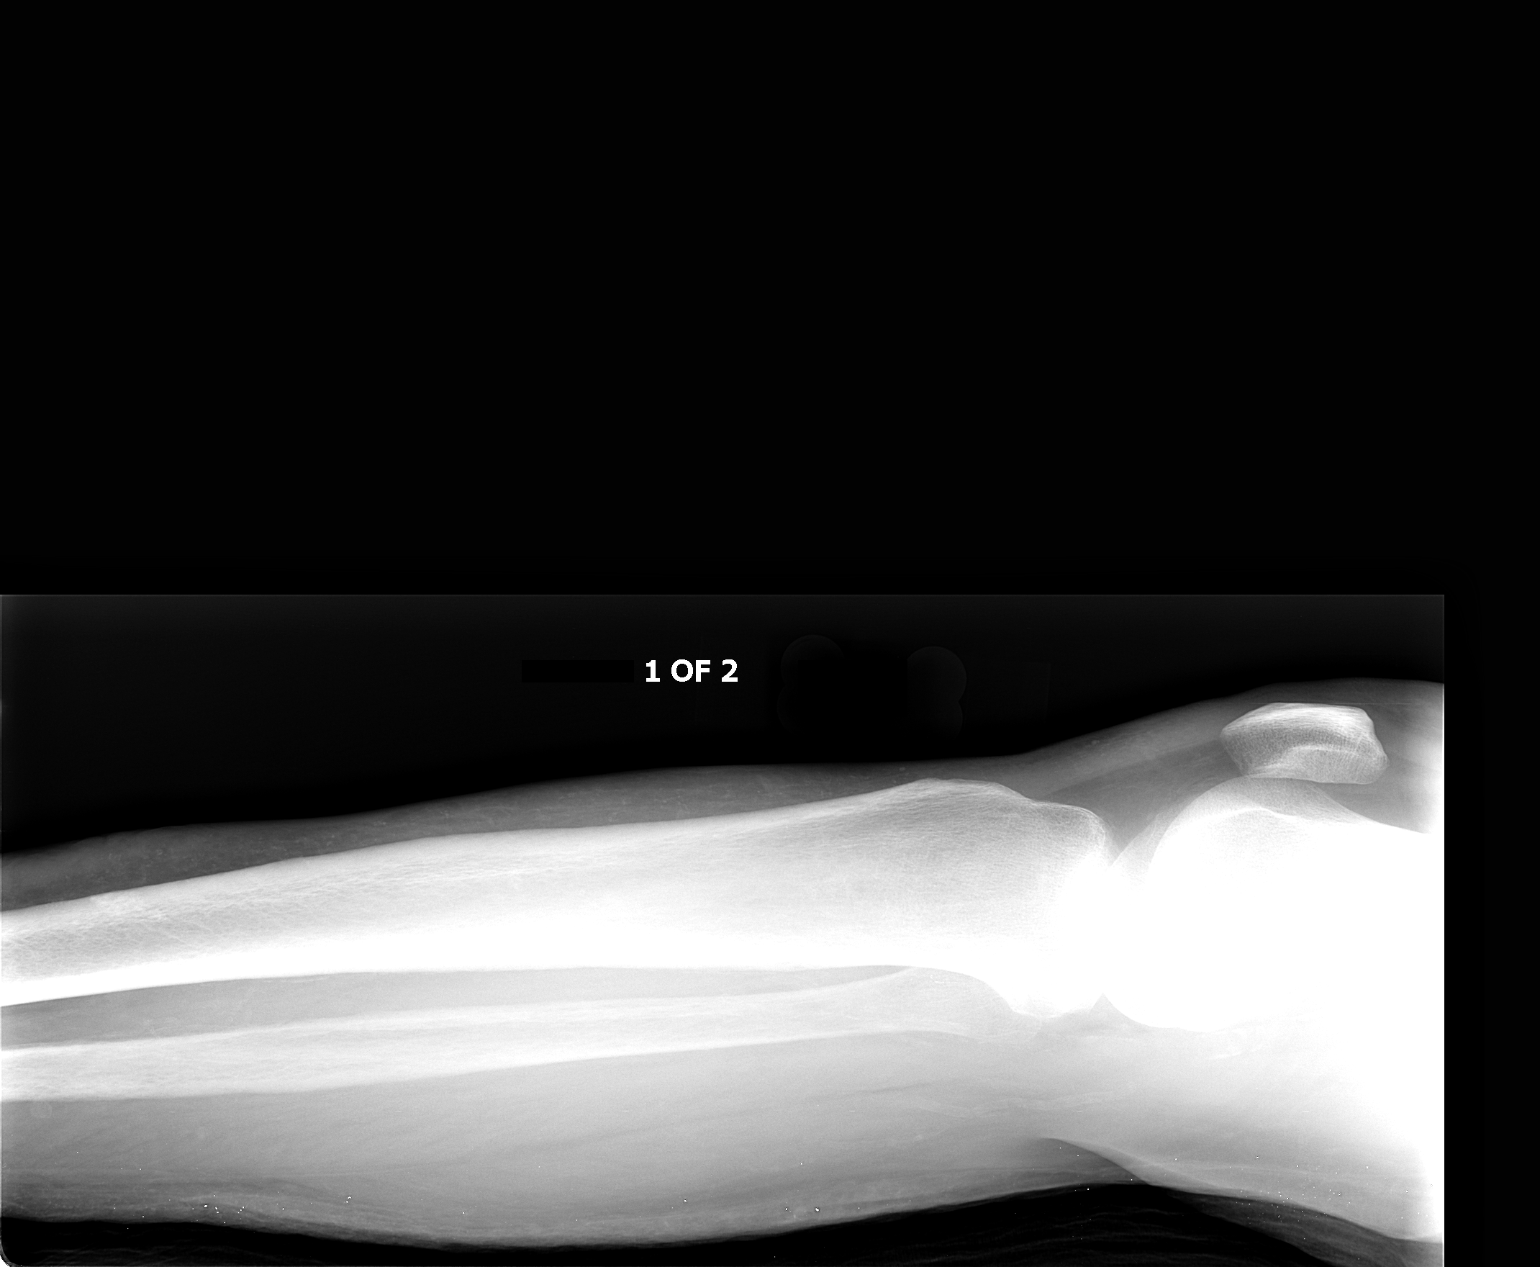

[view not recorded (3 of 3)]
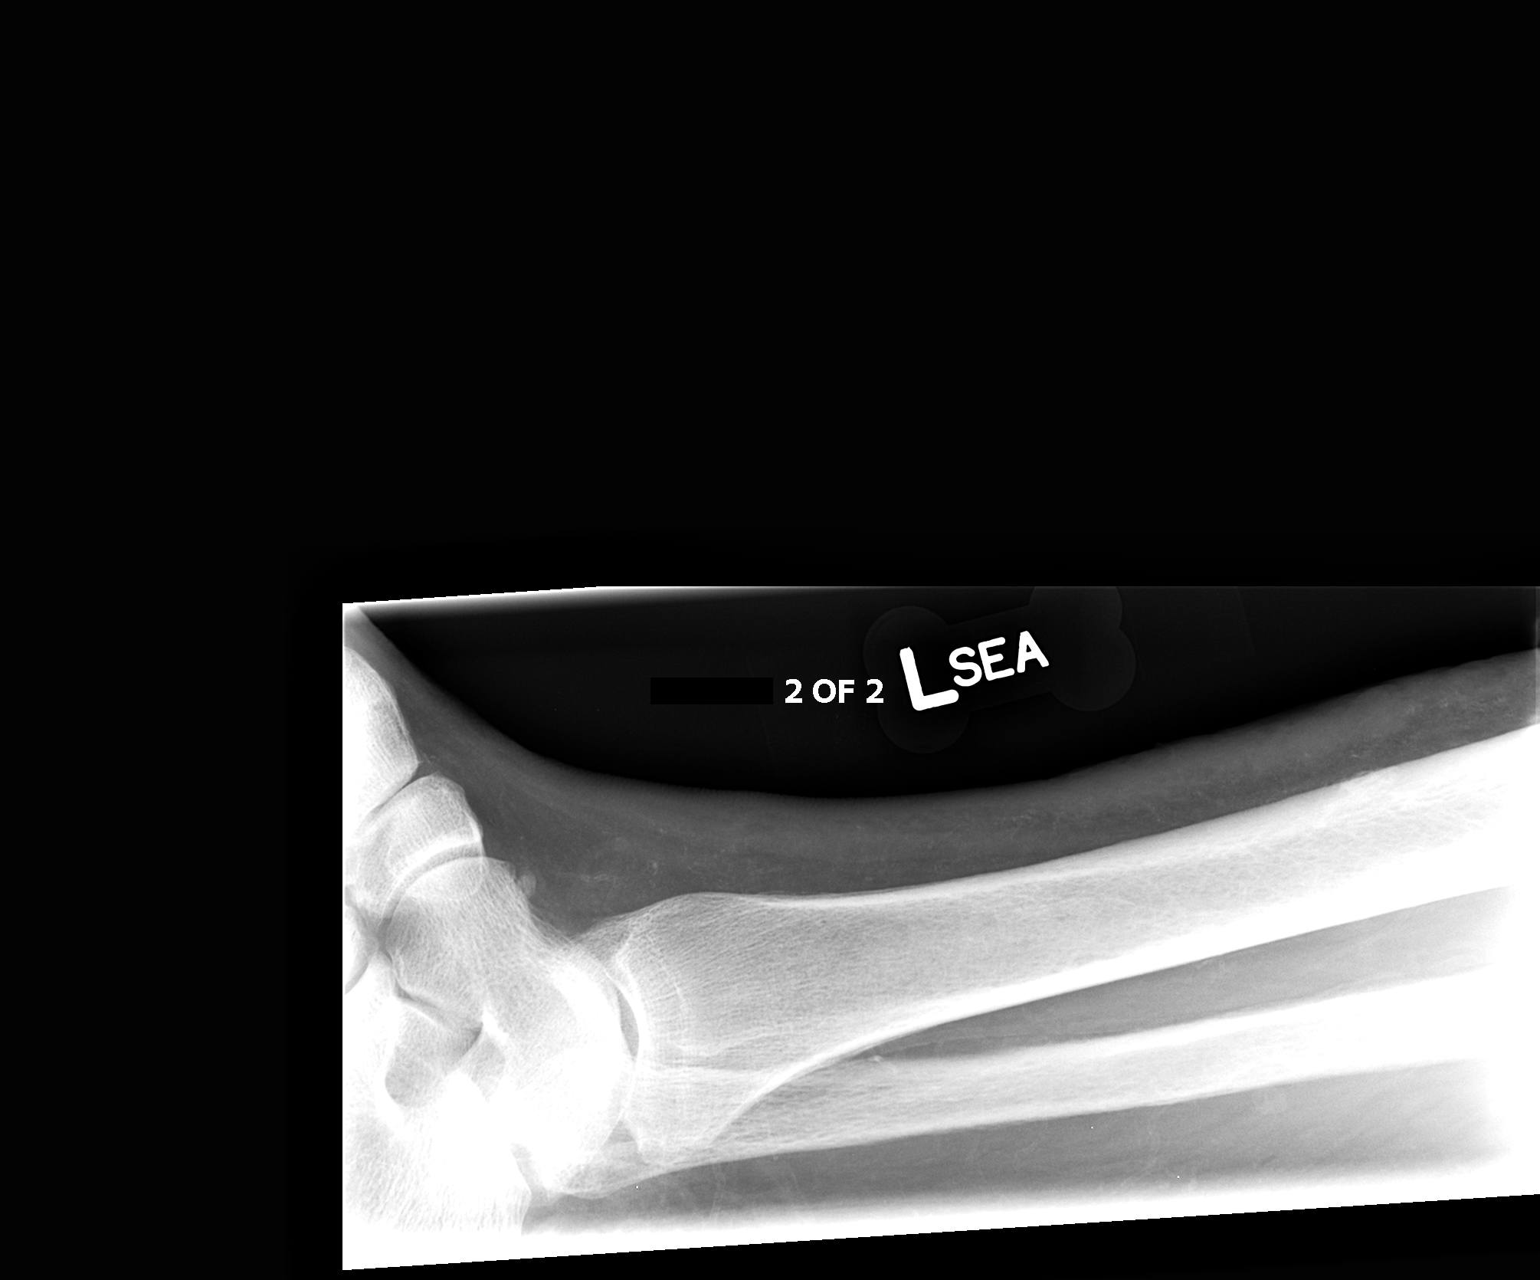

[3 of 3 positions shown; findings below may reference images not displayed]

FINDINGS: No evidence of acute fracture, subluxation or dislocation
identified.

No radio-opaque foreign bodies are present.

No focal bony lesions are noted.

The joint spaces are unremarkable.

Vascular calcifications are identified.
IMPRESSION: No evidence of bony abnormality.

## 2012-10-14 ENCOUNTER — Telehealth: Payer: Self-pay

## 2012-10-14 DIAGNOSIS — M7989 Other specified soft tissue disorders: Secondary | ICD-10-CM

## 2012-10-14 DIAGNOSIS — M79602 Pain in left arm: Secondary | ICD-10-CM

## 2012-10-14 NOTE — Telephone Encounter (Signed)
Pt. Called to report increased swelling and soreness in left upper arm over last 2 weeks at site of AVG.  States the graft hasn't been used for approx. 3 yrs. As she rec'd a kidney transplant.  Reports "some tingling in a couple of her fingers of left hand."  Describes a bluish discoloration at the upper arm AVG site.  Denies redness or inflammation.  Discussed w/  Dr. Imogene Burn.  Advised to sched. duplex of left arm AVG, and office appt. at 1st available.

## 2012-10-14 NOTE — Telephone Encounter (Signed)
I scheduled an appt for this pt on 10/21/12 at 10am w/ [facility]. She is aware of the appt/awt

## 2012-10-19 ENCOUNTER — Encounter: Payer: Self-pay | Admitting: Vascular Surgery

## 2012-10-21 ENCOUNTER — Encounter: Payer: Self-pay | Admitting: Neurosurgery

## 2012-10-21 ENCOUNTER — Ambulatory Visit: Payer: Medicare Other | Admitting: Vascular Surgery

## 2012-10-24 ENCOUNTER — Encounter: Payer: Self-pay | Admitting: Neurosurgery

## 2012-10-24 ENCOUNTER — Ambulatory Visit (INDEPENDENT_AMBULATORY_CARE_PROVIDER_SITE_OTHER): Payer: Medicare Other | Admitting: Neurosurgery

## 2012-10-24 ENCOUNTER — Encounter (INDEPENDENT_AMBULATORY_CARE_PROVIDER_SITE_OTHER): Payer: Medicare Other | Admitting: *Deleted

## 2012-10-24 VITALS — BP 139/87 | HR 79 | Resp 16 | Ht 67.0 in | Wt 242.0 lb

## 2012-10-24 DIAGNOSIS — M79602 Pain in left arm: Secondary | ICD-10-CM

## 2012-10-24 DIAGNOSIS — M7989 Other specified soft tissue disorders: Secondary | ICD-10-CM

## 2012-10-24 DIAGNOSIS — Z48812 Encounter for surgical aftercare following surgery on the circulatory system: Secondary | ICD-10-CM

## 2012-10-24 DIAGNOSIS — T82898A Other specified complication of vascular prosthetic devices, implants and grafts, initial encounter: Secondary | ICD-10-CM | POA: Insufficient documentation

## 2012-10-24 DIAGNOSIS — M79609 Pain in unspecified limb: Secondary | ICD-10-CM

## 2012-10-24 DIAGNOSIS — N186 End stage renal disease: Secondary | ICD-10-CM

## 2012-10-24 NOTE — Progress Notes (Signed)
Subjective:     Patient ID: [name], female   DOB: [birth year], 47 y.o.   MRN: [id number]  HPI: 47 year old female patient that underwent a right upper extremity AV fistula approximately 10-12 years ago. The patient received a kidney transplant 2011 states the fistula has not been used in almost 3 years. She complains of some right arm aching with some numbness in her hand  for about 2 weeks and asked to be seen in evaluation. The patient has no other complaints.   Review of Systems: 12 point review of systems is notable for the difficulties described above otherwise unremarkable     Objective:   Physical Exam: Afebrile, vital signs are stable, patient does have a faint radial pulse to palpation but is heard easily with Doppler per Dr. Myra Gianotti. There is no redness or drainage around the fistula itself.     Assessment:     Upper extremity duplex shows an occluded right arm dialysis graft with thrombus present in the basilic vein, basilic vein is noncompressible. Dr. Myra Gianotti examined the patient and explained to her he does not feel that the fistula itself is causing her numbness and aching.    Plan:     The patient will followup here on when necessary basis, she will see her medical doctor regarding her arm pain. The patient's in agreement with this, her questions were encouraged and answered.  Lauree Chandler ANP  Clinic M.D.: Myra Gianotti

## 2012-10-28 ENCOUNTER — Ambulatory Visit: Payer: Medicare Other | Admitting: Vascular Surgery

## 2012-12-26 ENCOUNTER — Emergency Department (HOSPITAL_COMMUNITY)
Admission: EM | Admit: 2012-12-26 | Discharge: 2012-12-26 | Disposition: A | Discharge status: Home / Self Care | Payer: Medicare Other | Attending: Emergency Medicine | Admitting: Emergency Medicine

## 2012-12-26 ENCOUNTER — Encounter (HOSPITAL_COMMUNITY): Payer: Self-pay | Admitting: *Deleted

## 2012-12-26 DIAGNOSIS — E039 Hypothyroidism, unspecified: Secondary | ICD-10-CM | POA: Insufficient documentation

## 2012-12-26 DIAGNOSIS — N186 End stage renal disease: Secondary | ICD-10-CM | POA: Insufficient documentation

## 2012-12-26 DIAGNOSIS — Z87891 Personal history of nicotine dependence: Secondary | ICD-10-CM | POA: Insufficient documentation

## 2012-12-26 DIAGNOSIS — R61 Generalized hyperhidrosis: Secondary | ICD-10-CM | POA: Insufficient documentation

## 2012-12-26 DIAGNOSIS — I120 Hypertensive chronic kidney disease with stage 5 chronic kidney disease or end stage renal disease: Secondary | ICD-10-CM | POA: Insufficient documentation

## 2012-12-26 DIAGNOSIS — E1029 Type 1 diabetes mellitus with other diabetic kidney complication: Secondary | ICD-10-CM | POA: Insufficient documentation

## 2012-12-26 DIAGNOSIS — R5381 Other malaise: Secondary | ICD-10-CM | POA: Insufficient documentation

## 2012-12-26 DIAGNOSIS — R55 Syncope and collapse: Secondary | ICD-10-CM | POA: Insufficient documentation

## 2012-12-26 DIAGNOSIS — R51 Headache: Secondary | ICD-10-CM | POA: Insufficient documentation

## 2012-12-26 DIAGNOSIS — Z940 Kidney transplant status: Secondary | ICD-10-CM | POA: Insufficient documentation

## 2012-12-26 DIAGNOSIS — IMO0001 Reserved for inherently not codable concepts without codable children: Secondary | ICD-10-CM | POA: Insufficient documentation

## 2012-12-26 DIAGNOSIS — Z79899 Other long term (current) drug therapy: Secondary | ICD-10-CM | POA: Insufficient documentation

## 2012-12-26 DIAGNOSIS — Z794 Long term (current) use of insulin: Secondary | ICD-10-CM | POA: Insufficient documentation

## 2012-12-26 DIAGNOSIS — M2550 Pain in unspecified joint: Secondary | ICD-10-CM | POA: Insufficient documentation

## 2012-12-26 LAB — COMPREHENSIVE METABOLIC PANEL
ALT: 15 U/L (ref 0–35)
AST: 17 U/L (ref 0–37)
Albumin: 3.6 g/dL (ref 3.5–5.2)
CO2: 20 mEq/L (ref 19–32)
Calcium: 9.9 mg/dL (ref 8.4–10.5)
Chloride: 100 mEq/L (ref 96–112)
Creatinine, Ser: 1.03 mg/dL (ref 0.50–1.10)
GFR calc non Af Amer: 65 mL/min — ABNORMAL LOW (ref >90)
Sodium: 133 mEq/L — ABNORMAL LOW (ref 135–145)
Total Bilirubin: 0.4 mg/dL (ref 0.3–1.2)

## 2012-12-26 LAB — URINALYSIS, ROUTINE W REFLEX MICROSCOPIC
Glucose, UA: >1000 mg/dL — AB
Hgb urine dipstick: NEGATIVE
Leukocytes, UA: NEGATIVE
Protein, ur: NEGATIVE mg/dL
pH: 5.5 (ref 5.0–8.0)

## 2012-12-26 LAB — POCT I-STAT TROPONIN I: Troponin i, poc: 0.05 ng/mL (ref 0.00–0.08)

## 2012-12-26 LAB — CBC
Platelets: 151 10*3/uL (ref 150–400)
RBC: 5.12 MIL/uL — ABNORMAL HIGH (ref 3.87–5.11)
RDW: 13 % (ref 11.5–15.5)
WBC: 6.6 10*3/uL (ref 4.0–10.5)

## 2012-12-26 LAB — URINE MICROSCOPIC-ADD ON

## 2012-12-26 MED ORDER — KETOROLAC TROMETHAMINE 30 MG/ML IJ SOLN
30.0000 mg | Freq: Once | INTRAMUSCULAR | Status: AC
  Administered 2012-12-26: 30 mg via INTRAVENOUS
  Filled 2012-12-26: qty 1

## 2012-12-26 MED ORDER — SODIUM CHLORIDE 0.9 % IV BOLUS (SEPSIS)
1000.0000 mL | Freq: Once | INTRAVENOUS | Status: AC
  Administered 2012-12-26: 1000 mL via INTRAVENOUS

## 2012-12-26 NOTE — ED Notes (Signed)
Per EMS pt from home with c/o syncopal episode witnessed by family. CBG 100, but pt states usually runs 300. Pt unresponsive on EMS arrival. Gave 25 gm D10. CBG 290, pt alert and oriented now. Pt c/o headache, feeling tired. 20G R Hand. Vital signs stable.

## 2012-12-26 NOTE — ED Provider Notes (Signed)
History     CSN: [id number]  Arrival date & time 12/26/12  1310   First MD Initiated Contact with Patient 12/26/12 1318      Chief Complaint  Patient presents with  . Loss of Consciousness    (Consider location/radiation/quality/duration/timing/severity/associated sxs/prior treatment) HPI Comments: 47 y/o female with a PMHx of HTN, hypothyroid, ESRD, DM, and immunosuppression s/p kidney transplant presents to the ED after having a syncopal episode prior to arrival witnessed by her husband. Husband states patient was walking to the store with some neighbors, and on her way back neighbors went to get her husband because patient stopped walking, appeared diaphoretic and was acting "funny". Husband took her to the porch and states she was sitting in the chair very unsteady and unresponsive, gave her sugar water and candy and she became oriented once EMS arrived. Husband checked sugar initially which was 100, and according to patient this is low for her. CBG per EMS 290. Currently patient states she has a headache, feels a little weak and achy. PCP is her kidney doctor Dr. Arrie Aran with [facility], and she sees transplant team at [facility]. She is on pancreatic transplant list. No recent medication changes. Has not been on dialysis for 3 years.  The history is provided by the patient and the spouse.    Past Medical History  Diagnosis Date  . Hypertension   . Hypothyroid   . Depression   . 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 Kidney disease   . End stage renal disease     right arm AV graft, post transplant  . Diabetes mellitus     Insulin dependant    Past Surgical History  Procedure Laterality Date  . Back surgery  2001  . Tubal ligation    . Dg av dialysis graft declot or    . Nephrectomy transplanted organ    . Kidney transplant  December 16, 2009    Family History  Problem Relation Age of Onset  . Emphysema Mother   . Throat cancer Mother   .  COPD Mother   . Cancer Mother   . Emphysema Father   . COPD Father   . Stroke Father   . ADD / ADHD Son     History  Substance Use Topics  . Smoking status: Former Smoker -- 1.00 packs/day for 11 years    Types: Cigarettes    Quit date: 09/21/1998  . Smokeless tobacco: Never Used  . Alcohol Use: No    OB History   Grav Para Term Preterm Abortions TAB SAB Ect Mult Living                  Review of Systems  Constitutional: Positive for diaphoresis and fatigue.  Eyes: Negative for visual disturbance.  Respiratory: Negative for shortness of breath.   Cardiovascular: Negative for chest pain.  Gastrointestinal: Negative for nausea, vomiting and abdominal pain.  Musculoskeletal: Positive for myalgias and arthralgias.  Neurological: Positive for syncope and headaches. Negative for dizziness.  Psychiatric/Behavioral: Negative for confusion.  All other systems reviewed and are negative.    Allergies  Ace inhibitors and Adhesive  Home Medications   Current Outpatient Rx  Name  Route  Sig  Dispense  Refill  . Alum & Mag Hydroxide-Simeth (MAGIC MOUTHWASH W/LIDOCAINE) SOLN   Oral   Take 5 mLs by mouth 4 (four) times daily as needed.   100 mL   0   . amLODipine (NORVASC)  5 MG tablet   Oral   Take 1 tablet (5 mg total) by mouth daily.   30 tablet   0   . carvedilol (COREG) 25 MG tablet   Oral   Take 2 tablets (50 mg total) by mouth 2 (two) times daily.   60 tablet   0   . Chlorhexidine Gluconate Cloth 2 % PADS   Topical   Apply 6 each topically daily at 6 (six) AM.   1 each   3   . fluconazole (DIFLUCAN) 100 MG tablet   Oral   Take 1 tablet (100 mg total) by mouth daily.   5 tablet   0   . HYDROcodone-acetaminophen (NORCO/VICODIN) 5-325 MG per tablet   Oral   Take 1-2 tablets by mouth every 4 (four) hours as needed for pain.   60 tablet   0   . insulin aspart (NOVOLOG) 100 UNIT/ML injection   Subcutaneous   Inject 0-11 Units into the skin 3 (three)  times daily with meals. Sliding scale:  <70 0 units, drink orange juice; 70-120 0 units; 121-150 1 unit; 151-200 2 units; 201-250 3 units; 251-300 5 units; 301-350 7 units; 351-400 9 units; >400 11 units & call MD         . insulin glargine (LANTUS) 100 UNIT/ML injection   Subcutaneous   Inject 30-40 Units into the skin See admin instructions. Takes 40 units in the morning, and 30 units at bedtime   10 mL   3   . levothyroxine (SYNTHROID, LEVOTHROID) 137 MCG tablet   Oral   Take 137 mcg by mouth daily. Name brand         . menthol-cetylpyridinium (CEPACOL) 3 MG lozenge   Oral   Take 1 lozenge (3 mg total) by mouth as needed for pain.   100 tablet   0   . mycophenolate (CELLCEPT) 250 MG capsule   Oral   Take 500 mg by mouth 2 (two) times daily.          . phenol (CHLORASEPTIC) 1.4 % LIQD   Mouth/Throat   Use as directed 1 spray in the mouth or throat as needed.   1 Bottle   0   . predniSONE (DELTASONE) 5 MG tablet   Oral   Take 5 mg by mouth daily.           . ranitidine (ZANTAC) 150 MG capsule   Oral   Take 150 mg by mouth at bedtime.          . tacrolimus (PROGRAF) 1 MG capsule   Oral   Take 1 mg by mouth 2 (two) times daily.            BP 130/63  Pulse 77  Temp(Src) 97.7 F (36.5 C) (Oral)  Resp 15  SpO2 99%  LMP 11/19/2011  Physical Exam  Nursing note and vitals reviewed. Constitutional: She is oriented to person, place, and time. She appears well-developed and well-nourished. No distress.  HENT:  Head: Normocephalic and atraumatic.  Mouth/Throat: Oropharynx is clear and moist.  Eyes: Conjunctivae and EOM are normal. Pupils are equal, round, and reactive to light.  Neck: Normal range of motion. Neck supple.  Cardiovascular: Normal rate, regular rhythm and normal heart sounds.   Pulmonary/Chest: Effort normal and breath sounds normal. No respiratory distress. She has no wheezes. She has no rales. She exhibits no tenderness.  Abdominal: Soft.  Bowel sounds are normal. She exhibits no distension. There is no tenderness.  Musculoskeletal: Normal range of motion.  Neurological: She is alert and oriented to person, place, and time. No cranial nerve deficit. Coordination normal.  Skin: Skin is warm and dry. She is not diaphoretic.  Psychiatric: She has a normal mood and affect. Her speech is normal and behavior is normal.    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   No diagnosis found.    MDM  Syncopal episode more than likely from hypoglycemia. Symptoms improved with sugar water and candy. Patient AAOx3, NAD. CBG in ED 289. Obtaining labs- cbc, cmp, lipase, troponin, u/a. EKG- sinus rhythm, RBBB, LPFB. Giving 1L fluid bolus, toradol for headache. Case discussed with Dr. Patria Mane who agrees with plan of care.  4:07 PM Care will be assumed by Dr. Anitra Lauth at shift change. Awaiting labs. If all normal, plan will be to discharge home.  Trevor Mace, PA-C 12/26/12 1607

## 2012-12-26 NOTE — ED Provider Notes (Addendum)
Medical screening examination/treatment/procedure(s) were conducted as a shared visit with non-physician practitioner(s) and myself.  I personally evaluated the patient during the encounter  Well appearing. No complaints at this time. Labs and ecg normal. Near syncope. Telemetry reviewed in the ER and normal. Dc home in good condition   Lyanne Co, MD 12/26/12 1637  Lyanne Co, MD 12/26/12 [phone]

## 2012-12-26 NOTE — ED Notes (Signed)
Blood glucose 289

## 2012-12-26 NOTE — ED Notes (Signed)
AIDET performed. 

## 2013-01-05 ENCOUNTER — Inpatient Hospital Stay (HOSPITAL_COMMUNITY)
Admission: EM | Admit: 2013-01-05 | Discharge: 2013-01-07 | DRG: 637 | Disposition: A | Discharge status: Home / Self Care | Payer: Medicare Other | Attending: Internal Medicine | Admitting: Internal Medicine

## 2013-01-05 ENCOUNTER — Emergency Department (HOSPITAL_COMMUNITY): Payer: Medicare Other

## 2013-01-05 ENCOUNTER — Encounter (HOSPITAL_COMMUNITY): Payer: Self-pay | Admitting: Emergency Medicine

## 2013-01-05 DIAGNOSIS — N058 Unspecified nephritic syndrome with other morphologic changes: Secondary | ICD-10-CM | POA: Diagnosis present

## 2013-01-05 DIAGNOSIS — Z794 Long term (current) use of insulin: Secondary | ICD-10-CM

## 2013-01-05 DIAGNOSIS — E1110 Type 2 diabetes mellitus with ketoacidosis without coma: Secondary | ICD-10-CM

## 2013-01-05 DIAGNOSIS — I10 Essential (primary) hypertension: Secondary | ICD-10-CM

## 2013-01-05 DIAGNOSIS — I120 Hypertensive chronic kidney disease with stage 5 chronic kidney disease or end stage renal disease: Secondary | ICD-10-CM | POA: Diagnosis present

## 2013-01-05 DIAGNOSIS — F329 Major depressive disorder, single episode, unspecified: Secondary | ICD-10-CM | POA: Diagnosis present

## 2013-01-05 DIAGNOSIS — E1010 Type 1 diabetes mellitus with ketoacidosis without coma: Secondary | ICD-10-CM

## 2013-01-05 DIAGNOSIS — E1049 Type 1 diabetes mellitus with other diabetic neurological complication: Secondary | ICD-10-CM | POA: Diagnosis present

## 2013-01-05 DIAGNOSIS — E1065 Type 1 diabetes mellitus with hyperglycemia: Secondary | ICD-10-CM | POA: Diagnosis present

## 2013-01-05 DIAGNOSIS — I959 Hypotension, unspecified: Secondary | ICD-10-CM | POA: Diagnosis present

## 2013-01-05 DIAGNOSIS — Z79899 Other long term (current) drug therapy: Secondary | ICD-10-CM

## 2013-01-05 DIAGNOSIS — D696 Thrombocytopenia, unspecified: Secondary | ICD-10-CM | POA: Diagnosis present

## 2013-01-05 DIAGNOSIS — E039 Hypothyroidism, unspecified: Secondary | ICD-10-CM | POA: Diagnosis present

## 2013-01-05 DIAGNOSIS — E875 Hyperkalemia: Secondary | ICD-10-CM

## 2013-01-05 DIAGNOSIS — N186 End stage renal disease: Secondary | ICD-10-CM | POA: Diagnosis present

## 2013-01-05 DIAGNOSIS — N179 Acute kidney failure, unspecified: Secondary | ICD-10-CM | POA: Diagnosis present

## 2013-01-05 DIAGNOSIS — E871 Hypo-osmolality and hyponatremia: Secondary | ICD-10-CM | POA: Diagnosis present

## 2013-01-05 DIAGNOSIS — G4733 Obstructive sleep apnea (adult) (pediatric): Secondary | ICD-10-CM | POA: Diagnosis present

## 2013-01-05 DIAGNOSIS — Z940 Kidney transplant status: Secondary | ICD-10-CM

## 2013-01-05 DIAGNOSIS — I059 Rheumatic mitral valve disease, unspecified: Secondary | ICD-10-CM

## 2013-01-05 DIAGNOSIS — F3289 Other specified depressive episodes: Secondary | ICD-10-CM | POA: Diagnosis present

## 2013-01-05 DIAGNOSIS — E1142 Type 2 diabetes mellitus with diabetic polyneuropathy: Secondary | ICD-10-CM | POA: Diagnosis present

## 2013-01-05 LAB — BASIC METABOLIC PANEL
BUN: 45 mg/dL — ABNORMAL HIGH (ref 6–23)
BUN: 42 mg/dL — ABNORMAL HIGH (ref 6–23)
BUN: 40 mg/dL — ABNORMAL HIGH (ref 6–23)
CO2: 14 mEq/L — ABNORMAL LOW (ref 19–32)
CO2: 18 mEq/L — ABNORMAL LOW (ref 19–32)
Calcium: 9.7 mg/dL (ref 8.4–10.5)
Chloride: 92 mEq/L — ABNORMAL LOW (ref 96–112)
Chloride: 98 mEq/L (ref 96–112)
Chloride: 102 mEq/L (ref 96–112)
Creatinine, Ser: 1.65 mg/dL — ABNORMAL HIGH (ref 0.50–1.10)
Creatinine, Ser: 1.47 mg/dL — ABNORMAL HIGH (ref 0.50–1.10)
Creatinine, Ser: 1.38 mg/dL — ABNORMAL HIGH (ref 0.50–1.10)
GFR calc Af Amer: 42 mL/min — ABNORMAL LOW (ref >90)
GFR calc non Af Amer: 37 mL/min — ABNORMAL LOW (ref >90)
Glucose, Bld: 375 mg/dL — ABNORMAL HIGH (ref 70–99)
Glucose, Bld: 289 mg/dL — ABNORMAL HIGH (ref 70–99)
Glucose, Bld: 195 mg/dL — ABNORMAL HIGH (ref 70–99)
Potassium: 4.7 mEq/L (ref 3.5–5.1)

## 2013-01-05 LAB — COMPREHENSIVE METABOLIC PANEL
Alkaline Phosphatase: 141 U/L — ABNORMAL HIGH (ref 39–117)
BUN: 44 mg/dL — ABNORMAL HIGH (ref 6–23)
CO2: 10 mEq/L — CL (ref 19–32)
Chloride: 87 mEq/L — ABNORMAL LOW (ref 96–112)
Creatinine, Ser: 1.69 mg/dL — ABNORMAL HIGH (ref 0.50–1.10)
GFR calc non Af Amer: 36 mL/min — ABNORMAL LOW (ref >90)
Potassium: 5.9 mEq/L — ABNORMAL HIGH (ref 3.5–5.1)
Total Bilirubin: 0.5 mg/dL (ref 0.3–1.2)

## 2013-01-05 LAB — URINALYSIS, ROUTINE W REFLEX MICROSCOPIC
Ketones, ur: >80 mg/dL — AB
Leukocytes, UA: NEGATIVE
Protein, ur: NEGATIVE mg/dL
Urobilinogen, UA: 0.2 mg/dL (ref 0.0–1.0)

## 2013-01-05 LAB — CBC WITH DIFFERENTIAL/PLATELET
HCT: 42 % (ref 36.0–46.0)
Hemoglobin: 14.5 g/dL (ref 12.0–15.0)
Lymphocytes Relative: 13 % (ref 12–46)
Lymphs Abs: 0.9 10*3/uL (ref 0.7–4.0)
Monocytes Absolute: 0.3 10*3/uL (ref 0.1–1.0)
Monocytes Relative: 5 % (ref 3–12)
Neutro Abs: 5.5 10*3/uL (ref 1.7–7.7)
RBC: 4.96 MIL/uL (ref 3.87–5.11)
WBC: 6.5 10*3/uL (ref 4.0–10.5)

## 2013-01-05 LAB — GLUCOSE, CAPILLARY
Glucose-Capillary: >600 mg/dL (ref 70–99)
Glucose-Capillary: 123 mg/dL — ABNORMAL HIGH (ref 70–99)

## 2013-01-05 LAB — MAGNESIUM: Magnesium: 2 mg/dL (ref 1.5–2.5)

## 2013-01-05 LAB — TSH: TSH: 1.849 u[IU]/mL (ref 0.350–4.500)

## 2013-01-05 LAB — URINE MICROSCOPIC-ADD ON

## 2013-01-05 LAB — CK TOTAL AND CKMB (NOT AT ARMC)
CK, MB: 4.5 ng/mL — ABNORMAL HIGH (ref 0.3–4.0)
Total CK: 70 U/L (ref 7–177)

## 2013-01-05 MED ORDER — SODIUM CHLORIDE 0.9 % IV SOLN
INTRAVENOUS | Status: DC
  Administered 2013-01-05: 09:00:00 via INTRAVENOUS

## 2013-01-05 MED ORDER — TACROLIMUS 1 MG PO CAPS
1.0000 mg | ORAL_CAPSULE | Freq: Two times a day (BID) | ORAL | Status: DC
  Administered 2013-01-05 – 2013-01-07 (×5): 1 mg via ORAL
  Filled 2013-01-05 (×6): qty 1

## 2013-01-05 MED ORDER — MUPIROCIN 2 % EX OINT
1.0000 "application " | TOPICAL_OINTMENT | Freq: Two times a day (BID) | CUTANEOUS | Status: DC
  Administered 2013-01-05 – 2013-01-07 (×5): 1 via NASAL
  Filled 2013-01-05: qty 22

## 2013-01-05 MED ORDER — SODIUM CHLORIDE 0.9 % IV SOLN
1000.0000 mL | Freq: Once | INTRAVENOUS | Status: AC
  Administered 2013-01-05: 1000 mL via INTRAVENOUS

## 2013-01-05 MED ORDER — CHLORHEXIDINE GLUCONATE CLOTH 2 % EX PADS
6.0000 | MEDICATED_PAD | Freq: Every day | CUTANEOUS | Status: DC
  Administered 2013-01-06 – 2013-01-07 (×2): 6 via TOPICAL

## 2013-01-05 MED ORDER — ENOXAPARIN SODIUM 40 MG/0.4ML ~~LOC~~ SOLN
40.0000 mg | SUBCUTANEOUS | Status: DC
  Administered 2013-01-05 – 2013-01-06 (×2): 40 mg via SUBCUTANEOUS
  Filled 2013-01-05 (×3): qty 0.4

## 2013-01-05 MED ORDER — SODIUM CHLORIDE 0.9 % IV SOLN: INTRAVENOUS | Status: DC

## 2013-01-05 MED ORDER — DEXTROSE-NACL 5-0.45 % IV SOLN: INTRAVENOUS | Status: DC

## 2013-01-05 MED ORDER — SODIUM CHLORIDE 0.9 % IV SOLN
INTRAVENOUS | Status: DC
  Administered 2013-01-05: 09:00:00 via INTRAVENOUS
  Filled 2013-01-05: qty 1

## 2013-01-05 MED ORDER — ONDANSETRON HCL 4 MG/2ML IJ SOLN
4.0000 mg | Freq: Once | INTRAMUSCULAR | Status: AC
  Administered 2013-01-05: 4 mg via INTRAVENOUS
  Filled 2013-01-05: qty 2

## 2013-01-05 MED ORDER — DEXTROSE-NACL 5-0.45 % IV SOLN
INTRAVENOUS | Status: DC
  Administered 2013-01-05: 16:00:00 via INTRAVENOUS
  Administered 2013-01-05 – 2013-01-06 (×2): 150 mL via INTRAVENOUS

## 2013-01-05 MED ORDER — ACETAMINOPHEN 325 MG PO TABS
650.0000 mg | ORAL_TABLET | ORAL | Status: DC | PRN
  Administered 2013-01-05 – 2013-01-06 (×3): 650 mg via ORAL
  Filled 2013-01-05 (×3): qty 2

## 2013-01-05 MED ORDER — FAMOTIDINE IN NACL 20-0.9 MG/50ML-% IV SOLN: 20.0000 mg | Freq: Two times a day (BID) | INTRAVENOUS | Status: DC

## 2013-01-05 MED ORDER — MYCOPHENOLATE MOFETIL 250 MG PO CAPS
500.0000 mg | ORAL_CAPSULE | Freq: Two times a day (BID) | ORAL | Status: DC
  Administered 2013-01-05 – 2013-01-07 (×5): 500 mg via ORAL
  Filled 2013-01-05 (×6): qty 2

## 2013-01-05 MED ORDER — ONDANSETRON HCL 4 MG/2ML IJ SOLN: 4.0000 mg | Freq: Four times a day (QID) | INTRAMUSCULAR | Status: DC | PRN

## 2013-01-05 MED ORDER — SODIUM CHLORIDE 0.9 % IV SOLN
1.0000 g | Freq: Once | INTRAVENOUS | Status: AC
  Administered 2013-01-05: 1 g via INTRAVENOUS
  Filled 2013-01-05: qty 10

## 2013-01-05 MED ORDER — DEXTROSE 50 % IV SOLN: 25.0000 mL | INTRAVENOUS | Status: DC | PRN

## 2013-01-05 MED ORDER — SODIUM CHLORIDE 0.9 % IV SOLN: 1000.0000 mL | INTRAVENOUS | Status: DC

## 2013-01-05 MED ORDER — SODIUM CHLORIDE 0.9 % IV SOLN
Freq: Once | INTRAVENOUS | Status: AC
  Administered 2013-01-05: 07:00:00 via INTRAVENOUS

## 2013-01-05 MED ORDER — SODIUM CHLORIDE 0.9 % IV BOLUS (SEPSIS)
1000.0000 mL | Freq: Once | INTRAVENOUS | Status: AC
  Administered 2013-01-05: 1000 mL via INTRAVENOUS

## 2013-01-05 MED ORDER — PREDNISONE 5 MG PO TABS
5.0000 mg | ORAL_TABLET | Freq: Every day | ORAL | Status: DC
  Administered 2013-01-05 – 2013-01-07 (×3): 5 mg via ORAL
  Filled 2013-01-05 (×3): qty 1

## 2013-01-05 MED ORDER — SODIUM BICARBONATE 8.4 % IV SOLN
50.0000 meq | Freq: Once | INTRAVENOUS | Status: AC
  Administered 2013-01-05: 50 meq via INTRAVENOUS
  Filled 2013-01-05: qty 50

## 2013-01-05 MED ORDER — PANTOPRAZOLE SODIUM 40 MG IV SOLR: 40.0000 mg | INTRAVENOUS | Status: DC

## 2013-01-05 MED ORDER — FAMOTIDINE IN NACL 20-0.9 MG/50ML-% IV SOLN
20.0000 mg | Freq: Two times a day (BID) | INTRAVENOUS | Status: DC
  Administered 2013-01-05 – 2013-01-07 (×5): 20 mg via INTRAVENOUS
  Filled 2013-01-05 (×8): qty 50

## 2013-01-05 MED ORDER — LEVOTHYROXINE SODIUM 137 MCG PO TABS
137.0000 ug | ORAL_TABLET | Freq: Every day | ORAL | Status: DC
  Administered 2013-01-05 – 2013-01-07 (×3): 137 ug via ORAL
  Filled 2013-01-05 (×4): qty 1

## 2013-01-05 MED ORDER — SODIUM CHLORIDE 0.9 % IV SOLN
INTRAVENOUS | Status: DC
  Administered 2013-01-05: 4.1 [IU]/h via INTRAVENOUS
  Administered 2013-01-05: 2.5 [IU]/h via INTRAVENOUS
  Administered 2013-01-05: 5.4 [IU]/h via INTRAVENOUS
  Administered 2013-01-06: 6.1 [IU]/h via INTRAVENOUS
  Filled 2013-01-05 (×2): qty 1

## 2013-01-05 MED ORDER — SODIUM CHLORIDE 0.9 % IV SOLN
INTRAVENOUS | Status: DC
  Administered 2013-01-05 (×2): via INTRAVENOUS

## 2013-01-05 NOTE — ED Provider Notes (Signed)
Date: 01/05/2013  Rate: 76  Rhythm: normal sinus rhythm  QRS Axis: normal  Intervals: normal  ST/T Wave abnormalities: normal  Conduction Disutrbances: none  Narrative Interpretation:   Old EKG Reviewed: No significant changes noted     Lyanne Co, MD 01/05/13 [phone]

## 2013-01-05 NOTE — ED Notes (Signed)
Pt presents with hyperglycemia.  Pt states it has been high since yesterday morning.  Pt has problems controlling her blood sugar.  Pt last took 40 humilin N and novolog sliding scale last evening

## 2013-01-05 NOTE — ED Notes (Signed)
Pt CBG is critical high, RN made aware.

## 2013-01-05 NOTE — ED Provider Notes (Signed)
History     CSN: [id number]  Arrival date & time 01/05/13  4098   First MD Initiated Contact with Patient 01/05/13 0654      Chief Complaint  Patient presents with  . Hyperglycemia    (Consider location/radiation/quality/duration/timing/severity/associated sxs/prior treatment) The history is provided by the patient and the spouse. No language interpreter was used.   Pt is a 47yo female BIB husband, PMH of HTN, hypothyroid, immunosuppression, ESRD post kidney transplant, and DM presenting with nausea and weakness. Pt also c/o vomiting, and dry mouth. Last insulin injection was at 1830 last night.  Husband reports pt was seen last week at [facility] for hypoglycemia.  Also reports pt has increased frequency of headaches and SOB while walking stairs associated with nonproductive cough over the past week.  She is seen at [facility] for her kidney transplant f/u, nephrologist is Dr. Ruta Hinds. Denies fevers dysuria or diarrhea.  Past Medical History  Diagnosis Date  . Hypertension   . Hypothyroid   . Depression   . 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 Kidney disease   . End stage renal disease     right arm AV graft, post transplant  . Diabetes mellitus     Insulin dependant    Past Surgical History  Procedure Laterality Date  . Back surgery  2001  . Tubal ligation    . Dg av dialysis graft declot or    . Nephrectomy transplanted organ    . Kidney transplant  December 16, 2009  . 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  . Cataract extraction w/ intraocular lens implant Right     Family History  Problem Relation Age of Onset  . Emphysema Mother   . Throat cancer Mother   . COPD Mother   . Cancer Mother   . Emphysema Father   . COPD Father   . Stroke Father   . ADD / ADHD Son     History  Substance Use Topics  . Smoking status: Former Smoker -- 1.00 packs/day for 11 years    Types: Cigarettes    Quit date: 09/21/1998  . Smokeless tobacco: Never Used  .  Alcohol Use: No    OB History   Grav Para Term Preterm Abortions TAB SAB Ect Mult Living                  Review of Systems  Constitutional: Positive for fatigue. Negative for fever, chills and unexpected weight change.  Respiratory: Positive for shortness of breath. Negative for chest tightness.   Cardiovascular: Negative for chest pain.  Gastrointestinal: Positive for nausea and vomiting. Negative for abdominal pain, diarrhea and constipation.  Endocrine: Positive for polydipsia.  Neurological: Positive for weakness and headaches.  All other systems reviewed and are negative.    Allergies  Ace inhibitors and Adhesive  Home Medications   No current outpatient prescriptions on file.  BP 106/41  Pulse 88  Temp(Src) 97.9 F (36.6 C) (Axillary)  Resp 20  Ht 5\' 10"  (1.778 m)  Wt 235 lb (106.595 kg)  BMI 33.72 kg/m2  SpO2 98%  LMP 11/19/2011  Physical Exam  Nursing note and vitals reviewed. Constitutional: She is oriented to person, place, and time. She appears well-developed and well-nourished. She appears lethargic.  Non-toxic appearance. No distress.  Pt appears lethargic but able to ambulate to the restroom.  HENT:  Head: Normocephalic and atraumatic.  Mouth/Throat: Mucous membranes are dry.  Eyes: Conjunctivae are normal.  No scleral icterus.  Neck: Normal range of motion. Neck supple.  Cardiovascular: Normal rate, regular rhythm and normal heart sounds.   Pulmonary/Chest: Effort normal and breath sounds normal. No respiratory distress. She has no wheezes.  Abdominal: Soft. Bowel sounds are normal. She exhibits no distension and no mass. There is no tenderness. There is no rebound and no guarding.  Soft, NTND  Musculoskeletal: Normal range of motion.  Neurological: She is oriented to person, place, and time. She appears lethargic.  Skin: Skin is warm and dry. She is not diaphoretic.  Psychiatric: She has a normal mood and affect. Her behavior is normal.    ED  Course  Procedures (including critical care time)  Labs Reviewed  GLUCOSE, CAPILLARY - Abnormal; Notable for the following:    Glucose-Capillary >600 (*)    All other components within normal limits  CBC WITH DIFFERENTIAL - Abnormal; Notable for the following:    Platelets 142 (*)    Neutrophils Relative 82 (*)    All other components within normal limits  COMPREHENSIVE METABOLIC PANEL - Abnormal; Notable for the following:    Sodium 128 (*)    Potassium 5.9 (*)    Chloride 87 (*)    CO2 10 (*)    Glucose, Bld 737 (*)    BUN 44 (*)    Creatinine, Ser 1.69 (*)    Calcium 10.9 (*)    Alkaline Phosphatase 141 (*)    GFR calc non Af Amer 36 (*)    GFR calc Af Amer 41 (*)    All other components within normal limits  URINALYSIS, ROUTINE W REFLEX MICROSCOPIC - Abnormal; Notable for the following:    Glucose, UA >1000 (*)    Hgb urine dipstick TRACE (*)    Ketones, ur >80 (*)    All other components within normal limits  GLUCOSE, CAPILLARY - Abnormal; Notable for the following:    Glucose-Capillary >600 (*)    All other components within normal limits  BLOOD GAS, VENOUS - Abnormal; Notable for the following:    pH, Ven 7.131 (*)    pCO2, Ven 31.7 (*)    Bicarbonate 10.1 (*)    Acid-base deficit 18.4 (*)    All other components within normal limits  GLUCOSE, CAPILLARY - Abnormal; Notable for the following:    Glucose-Capillary >600 (*)    All other components within normal limits  URINE MICROSCOPIC-ADD ON   Dg Chest 2 View  01/05/2013  *RADIOLOGY REPORT*  Clinical Data: Hyperglycemia, chest discomfort  CHEST - 2 VIEW  Comparison: 08/04/2012  Findings: Cardiomediastinal silhouette is stable.  No acute infiltrate or pleural effusion.  No pulmonary edema.  Bony thorax is unremarkable.  IMPRESSION: No active disease.  No significant change.   Original Report Authenticated By: Natasha Mead, M.D.      1. Diabetic ketoacidosis       MDM  Pt presented with CBG >600 upon arrival via  personal vehicle. Pt AA&Ox3 feeling weak and nauseated.  Stated on fluids and Zofran.  Obtained labs and EKG.  Discussed pt with Dr. Patria Mane who also evaluated the pt.    Labs indicate DKA, ARF, and hyperkalemia.  Plan is to continue giving fluids, start on insulin drip, sodium bicarb, calcium gluconate, and dextrose.  Plan to admit pt to step-down unit.   Dr. Patria Mane spoke with [facility] who agreed to admit the patient.    Vitals: unremarkable. Discharged in stable condition.    Discussed pt with attending during  ED encounter.      Junius Finner, PA-C 01/05/13 1054

## 2013-01-05 NOTE — Progress Notes (Signed)
Inpatient Diabetes Program Recommendations  AACE/ADA: New Consensus Statement on Inpatient Glycemic Control (2013)  Target Ranges:  Prepandial:   less than 140 mg/dL      Peak postprandial:   less than 180 mg/dL (1-2 hours)      Critically ill patients:  140 - 180 mg/dL   Reason for Visit: DKA  47 y.o. 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 Pt states her blood sugars have been high for over a week, but she ran out of strips and has been unable to check it.  States she has transportation problems and occasionally misses doctor appt if no ride is available.  "I need to lose about 30 pounds to get back on pancreas transplant list.  States difficulty with purchasing healthy foods due to expense.  Eats a lot of snack foods, chips, "junk food" and doesn't get any exercise since she gets SOB.  Has not skipped insulin doses   Inpatient Diabetes Program Recommendations Insulin - IV drip/GlucoStabilizer: Continue GlucoStabilizer until anion gap is closed Insulin - Basal: Give basal insulin 1-2 hours prior to discontinuation of insulin drip (N40 units bid is home basal rate) Correction (SSI): When insulin drip is discontinued, add Novolog sensitive tidwc and hs Insulin - Meal Coverage: When drip is discontinued, add Novolog 4 units tidwc if pt eats >50% meal HgbA1C: 9.0% on 01/05/2013 Outpatient Referral: Pt refuses at this time - states she does not have transportation. Diet: Advance to CHO mod med  Encouraged pt to view diabetes videos on pt ed channel.  Long discussion regarding importance of checking blood sugars and taking meds as prescribed.  Discussed healthier food choices and being mindful of portion control.    Will f/u in  am for further questions.    Thank you. Ailene Ards, RD, LDN, CDE Inpatient Diabetes Coordinator [phone]

## 2013-01-05 NOTE — H&P (Addendum)
[facility] History and Physical  [name]:[id number] DOB: 07/16/1966 DOA: 01/05/2013  Referring physician: Dr. Azalia Bilis, EDP PCP: [name], MD  Specialists:  1. Transplant Physician: at [facility].  Chief Complaint: Weakness & high blood sugars.  HPI: [name] is a 47 y.o. 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 She gives approximately a 1-1/2 week history of consistently elevated blood sugars ranging from 300-450 mg/dL. She claims compliance to her medications but not fully compliant with diabetic diet. In the last 3-4 days, she has noticed increased thirst, urinary frequency, dry mouth, generalized weakness, intermittent nausea and vomiting consisting of clear liquids but no blood or coffee grounds. The symptoms progressively worsened and she feels generally tired. She complains of some headache but no visual disturbances. She complains of chills but no fevers. She denies earache, sore throat, cough, abdominal pain, dysuria or open wounds. She was recently evaluated in the ED on 12/26/12 for syncope, possibly related to hypoglycemia. In the ED, blood pressures soft-90s/50s, sodium 128, potassium 5.9, bicarbonate 10, BUN 44, creatinine 1.69, calcium 10.9, blood sugar 737, platelets 142, negative chest x-ray. She has been started on IV fluid bolus and insulin drip and hospitalist admission requested.   Review of Systems: All systems reviewed and apart from history of presenting illness, complains of dyspnea on exertion (walking greater than 1 mile or climbing a flight of stairs) which has been ongoing for greater than one year but not associated with chest pain, dizziness or  lightheadedness.  Past Medical History  Diagnosis Date  . Hypertension   . Hypothyroid   . Depression   . 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 Kidney disease   . End stage renal disease     right arm AV graft, post transplant  . Diabetes mellitus     Insulin dependant   Past Surgical History  Procedure Laterality Date  . Back surgery  2001  . Tubal ligation    . Dg av dialysis graft declot or    . Nephrectomy transplanted organ    . Kidney transplant  December 16, 2009  . 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  . Cataract extraction w/ intraocular lens implant Right    Social History:  reports that she quit smoking about 14 years ago. Her smoking use included Cigarettes. She has a 11 pack-year smoking history. She has never used smokeless tobacco. She reports that she does not drink alcohol or use illicit drugs. Married. Lives with spouse. Independent of activities of daily living.  Allergies  Allergen Reactions  . Ace Inhibitors Other (See Comments)    Cough   ( Lisinopril )  . Adhesive (Tape) Itching    Family History  Problem Relation Age of Onset  . Emphysema Mother   . Throat cancer Mother   . COPD Mother   . Cancer Mother   . Emphysema Father   . COPD Father   . Stroke Father   . ADD / ADHD Son     Prior to Admission medications   Medication Sig Start Date End Date Taking? Authorizing Provider  amLODipine (NORVASC) 5 MG tablet Take 1 tablet (5 mg total) by mouth daily. 08/08/12  Yes Alison Murray, MD  carvedilol (COREG) 25  MG tablet Take 2 tablets (50 mg total) by mouth 2 (two) times daily. 08/08/12  Yes Alison Murray, MD  insulin aspart (NOVOLOG) 100 UNIT/ML injection Inject 8-14 Units into the skin 2 (two) times daily after a meal. Sliding scale:  2 units for every 100 over if 500 come to hospital   Yes Historical Provider, MD  insulin NPH (HUMULIN N,NOVOLIN N) 100 UNIT/ML injection Inject 40 Units into the skin 2 (two) times daily before a meal.    Yes Historical Provider, MD  levothyroxine (SYNTHROID, LEVOTHROID) 137 MCG tablet Take 137 mcg by mouth daily. Name brand   Yes Historical Provider, MD  mycophenolate (CELLCEPT) 250 MG capsule Take 500 mg by mouth 2 (two) times daily.    Yes Historical Provider, MD  predniSONE (DELTASONE) 5 MG tablet Take 5 mg by mouth daily.     Yes Historical Provider, MD  ranitidine (ZANTAC) 150 MG capsule Take 150 mg by mouth at bedtime.    Yes Historical Provider, MD  tacrolimus (PROGRAF) 1 MG capsule Take 1 mg by mouth 2 (two) times daily.    Yes Historical Provider, MD   Physical Exam: Filed Vitals:   01/05/13 8295 01/05/13 0845  BP: 99/52 96/46  Pulse: 77 76  Temp: 97.6 F (36.4 C)   TempSrc: Oral   Resp: 19 18  Height: 5\' 10"  (1.778 m)   Weight: 106.595 kg (235 lb)   SpO2: 100% 97%     General exam: Moderately built and overweight female patient, lying comfortably supine on the gurney in no obvious distress. She was seen ambulating steadily in the ED.  Head, eyes and ENT: Nontraumatic and normocephalic. Pupils equally reacting to light and accommodation. Oral mucosa dry.  Neck: Supple. No JVD, carotid bruit or thyromegaly.  Lymphatics: No lymphadenopathy.  Respiratory system: Clear to auscultation. No increased work of breathing.  Cardiovascular system: S1 and S2 heard, RRR. No JVD, murmurs, gallops, clicks or pedal edema.  Gastrointestinal system: Abdomen is nondistended, soft and nontender. Normal bowel sounds heard. No organomegaly or masses appreciated. Laparotomy scar RLQ. Mild redness in the umbilical region. No drainage.  Central nervous system: Alert and oriented. No focal neurological deficits.  Extremities: Symmetric 5 x 5 power. Peripheral pulses symmetrically felt. Patient has healed scars on bilateral shins. Amputated right first to third toes. Nonfunctioning AVF right forearm.  Skin: No rashes or acute findings.  Musculoskeletal system: Negative exam.  Psychiatry:  Pleasant and cooperative.   Labs on Admission:  Basic Metabolic Panel:  Recent Labs Lab 01/05/13 0736  NA 128*  K 5.9*  CL 87*  CO2 10*  GLUCOSE 737*  BUN 44*  CREATININE 1.69*  CALCIUM 10.9*   Liver Function Tests:  Recent Labs Lab 01/05/13 0736  AST 15  ALT 15  ALKPHOS 141*  BILITOT 0.5  PROT 7.7  ALBUMIN 3.8   No results found for this basename: LIPASE, AMYLASE,  in the last 168 hours No results found for this basename: AMMONIA,  in the last 168 hours CBC:  Recent Labs Lab 01/05/13 0736  WBC 6.5  NEUTROABS 5.5  HGB 14.5  HCT 42.0  MCV 84.7  PLT 142*   Cardiac Enzymes: No results found for this basename: CKTOTAL, CKMB, CKMBINDEX, TROPONINI,  in the last 168 hours  BNP (last 3 results) No results found for this basename: PROBNP,  in the last 8760 hours CBG:  Recent Labs Lab 01/05/13 0641 01/05/13 0722 01/05/13 1023  GLUCAP >600* >600* >600*  Radiological Exams on Admission: Dg Chest 2 View  01/05/2013  *RADIOLOGY REPORT*  Clinical Data: Hyperglycemia, chest discomfort  CHEST - 2 VIEW  Comparison: 08/04/2012  Findings: Cardiomediastinal silhouette is stable.  No acute infiltrate or pleural effusion.  No pulmonary edema.  Bony thorax is unremarkable.  IMPRESSION: No active disease.  No significant change.   Original Report Authenticated By: Natasha Mead, M.D.     EKG: Independently reviewed. Sinus rhythm,? LAD, T wave inversion in lead 1 and aVL, ST depression in inferior leads and leads V4-V6, ? RVH, RBBB (not new),? Peaked T waves and prolonged QTC-517 ms.  Assessment/Plan Principal Problem:   DKA, type 1 Active Problems:   HYPERTENSION   Immunosuppression   Hypothyroidism   Hyperkalemia   Hyponatremia   H/O kidney transplant   Renal failure, acute   Dehydration with hyponatremia   1. DKA, in poorly controlled type 1 DM: Admit to step down unit. Check hemoglobin A1c. Aggressive IV fluid hydration and insulin drip. Diabetes coordinator  consultation. Counseled patient extensively regarding compliance with medications and diet. Her home insulin regimen may have to be titrated. She will benefit from OP endocrinology and ophthalmology consultation. 2. Dehydration with hyponatremia: Secondary to DKA. Hyperglycemia is also contributing to hyponatremia/pseudohyponatremia. Follow BMP after hydration. 3. Acute renal failure complicating functioning cadaveric renal transplant: Patient had normal creatinine a week ago. Secondary to DKA. Aggressive IV fluid hydration and follow BMP closely. Continue immunosuppressants. 4. Hyperkalemia: Secondary to acute renal failure. Management as above and follow BMP closely. Patient not on ACE inhibitors or ARB's. 5. Hypotension/hypertension: Secondary to intravascular volume depletion. Hold antihypertensive medications. Hydrate and monitor. 6. Nausea and vomiting: Possibly secondary to DKA versus diabetic gastroparesis. Currently n.p.o. while on insulin drip and then advance diet as tolerated. IV PPI and when necessary Zofran. 7. Hypothyroidism: Continue Synthroid. Check TSH. 8. Mild thrombocytopenia:? Secondary to immunosuppressants. Follow CBC in a.m. 9. Dyspnea on exertion: Unclear etiology. Check 2-D echo. Patient has some EKG changes compared to prior. No chest pain but a long-standing diabetic. Follow EKG in a.m.[name] Check a set of cardiac enzymes. Monitor on telemetry. 10. Prolonged QTC: Followup EKG after correcting electrolyte abnormalities. Check magnesium.     Code Status: Full  Family Communication: Discussed directly with patient.  Disposition Plan: Inpatient admission. Home when medically stable.   Time spent: 70 minutes.  [facility] Pager [phone]  If 7PM-7AM, please contact night-coverage [email or web address] Password [facility] 01/05/2013, 10:49 AM

## 2013-01-06 LAB — GLUCOSE, CAPILLARY
Glucose-Capillary: 113 mg/dL — ABNORMAL HIGH (ref 70–99)
Glucose-Capillary: 129 mg/dL — ABNORMAL HIGH (ref 70–99)
Glucose-Capillary: 130 mg/dL — ABNORMAL HIGH (ref 70–99)
Glucose-Capillary: 141 mg/dL — ABNORMAL HIGH (ref 70–99)
Glucose-Capillary: 146 mg/dL — ABNORMAL HIGH (ref 70–99)
Glucose-Capillary: 146 mg/dL — ABNORMAL HIGH (ref 70–99)
Glucose-Capillary: 164 mg/dL — ABNORMAL HIGH (ref 70–99)
Glucose-Capillary: 178 mg/dL — ABNORMAL HIGH (ref 70–99)
Glucose-Capillary: 186 mg/dL — ABNORMAL HIGH (ref 70–99)
Glucose-Capillary: 195 mg/dL — ABNORMAL HIGH (ref 70–99)
Glucose-Capillary: 202 mg/dL — ABNORMAL HIGH (ref 70–99)
Glucose-Capillary: 212 mg/dL — ABNORMAL HIGH (ref 70–99)
Glucose-Capillary: 217 mg/dL — ABNORMAL HIGH (ref 70–99)
Glucose-Capillary: 236 mg/dL — ABNORMAL HIGH (ref 70–99)
Glucose-Capillary: 238 mg/dL — ABNORMAL HIGH (ref 70–99)
Glucose-Capillary: 264 mg/dL — ABNORMAL HIGH (ref 70–99)
Glucose-Capillary: 369 mg/dL — ABNORMAL HIGH (ref 70–99)
Glucose-Capillary: 423 mg/dL — ABNORMAL HIGH (ref 70–99)
Glucose-Capillary: 560 mg/dL (ref 70–99)

## 2013-01-06 LAB — CBC
HCT: 39 % (ref 36.0–46.0)
MCH: 28.3 pg (ref 26.0–34.0)
MCHC: 33.3 g/dL (ref 30.0–36.0)
RDW: 13.6 % (ref 11.5–15.5)

## 2013-01-06 LAB — BASIC METABOLIC PANEL
BUN: 19 mg/dL (ref 6–23)
CO2: 19 mEq/L (ref 19–32)
CO2: 19 mEq/L (ref 19–32)
Calcium: 9.1 mg/dL (ref 8.4–10.5)
Calcium: 9.5 mg/dL (ref 8.4–10.5)
Creatinine, Ser: 1.12 mg/dL — ABNORMAL HIGH (ref 0.50–1.10)
GFR calc non Af Amer: 58 mL/min — ABNORMAL LOW (ref >90)
Glucose, Bld: 186 mg/dL — ABNORMAL HIGH (ref 70–99)
Potassium: 4 mEq/L (ref 3.5–5.1)
Sodium: 133 mEq/L — ABNORMAL LOW (ref 135–145)
Sodium: 133 mEq/L — ABNORMAL LOW (ref 135–145)

## 2013-01-06 LAB — BLOOD GAS, VENOUS
Bicarbonate: 10.1 mEq/L — ABNORMAL LOW (ref 20.0–24.0)
Patient temperature: 98.6
TCO2: 9.6 mmol/L (ref 0–100)
pH, Ven: 7.131 — CL (ref 7.250–7.300)
pO2, Ven: 36.7 mmHg (ref 30.0–45.0)

## 2013-01-06 MED ORDER — INSULIN ASPART 100 UNIT/ML ~~LOC~~ SOLN
0.0000 [IU] | Freq: Three times a day (TID) | SUBCUTANEOUS | Status: DC
  Administered 2013-01-06 – 2013-01-07 (×3): 2 [IU] via SUBCUTANEOUS

## 2013-01-06 MED ORDER — INSULIN NPH (HUMAN) (ISOPHANE) 100 UNIT/ML ~~LOC~~ SUSP
20.0000 [IU] | Freq: Once | SUBCUTANEOUS | Status: AC
  Administered 2013-01-06: 20 [IU] via SUBCUTANEOUS

## 2013-01-06 MED ORDER — INSULIN ASPART 100 UNIT/ML ~~LOC~~ SOLN
0.0000 [IU] | Freq: Every day | SUBCUTANEOUS | Status: DC
  Administered 2013-01-06: 2 [IU] via SUBCUTANEOUS

## 2013-01-06 MED ORDER — INSULIN NPH (HUMAN) (ISOPHANE) 100 UNIT/ML ~~LOC~~ SUSP: 40.0000 [IU] | Freq: Once | SUBCUTANEOUS | Status: DC

## 2013-01-06 MED ORDER — INSULIN NPH (HUMAN) (ISOPHANE) 100 UNIT/ML ~~LOC~~ SUSP
40.0000 [IU] | Freq: Two times a day (BID) | SUBCUTANEOUS | Status: DC
  Administered 2013-01-07: 40 [IU] via SUBCUTANEOUS

## 2013-01-06 MED ORDER — INSULIN NPH (HUMAN) (ISOPHANE) 100 UNIT/ML ~~LOC~~ SUSP
40.0000 [IU] | Freq: Once | SUBCUTANEOUS | Status: AC
  Administered 2013-01-06: 40 [IU] via SUBCUTANEOUS

## 2013-01-06 MED ORDER — INSULIN ASPART 100 UNIT/ML ~~LOC~~ SOLN
3.0000 [IU] | Freq: Three times a day (TID) | SUBCUTANEOUS | Status: DC
  Administered 2013-01-06 – 2013-01-07 (×3): 3 [IU] via SUBCUTANEOUS

## 2013-01-06 MED ORDER — INSULIN NPH (HUMAN) (ISOPHANE) 100 UNIT/ML ~~LOC~~ SUSP
40.0000 [IU] | Freq: Two times a day (BID) | SUBCUTANEOUS | Status: DC
  Filled 2013-01-06: qty 10

## 2013-01-06 MED ORDER — CARVEDILOL 25 MG PO TABS
50.0000 mg | ORAL_TABLET | Freq: Two times a day (BID) | ORAL | Status: DC
  Administered 2013-01-06 – 2013-01-07 (×2): 50 mg via ORAL
  Filled 2013-01-06 (×4): qty 2

## 2013-01-06 NOTE — Progress Notes (Signed)
Patient arrived on unit via wheelchair, alert and oriented.  Oriented to the unit, ordering of meals, tv, call light, safety measures in place such as bed alarm, assistance to bathroom and patient verbalized understanding.  Patient has multiple tatoos, no glasses, no complaints of pain at this time, ate supper before being transferred per patient.

## 2013-01-06 NOTE — Progress Notes (Signed)
[facility] PROGRESS NOTE  RITHA SAMPEDRO EPP:[id number] DOB: [birth year] DOA: 01/05/2013 PCP: [name], MD  Brief narrative 47 y.o. 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 She gives approximately a 1-1/2 week history of consistently elevated blood sugars ranging from 300-450 mg/dL. She claims compliance to her medications but not fully compliant with diabetic diet. In the last 3-4 days, she has noticed increased thirst, urinary frequency, dry mouth, generalized weakness, intermittent nausea and vomiting consisting of clear liquids but no blood or coffee grounds. She was found to be DKA, Acute renal failure and hyperkalemic and was admitted to the step down unit for further management.   Assessment/Plan: 1. DKA, in poorly controlled type 1 DM: Admitted to step down unit. Patient was treated with aggressive IV fluid hydration and insulin drip. Her anion gap has normalized and CBGs are reasonable. Will transition to basal N. insulin or Lantus depending on insurance coverage and NovoLog meal and SSI. Counseled patient extensively regarding compliance with medications and diet. Her home insulin regimen may have to be titrated. She will benefit from OP endocrinology and ophthalmology consultation. Hemoglobin A1c: 9 2. Dehydration with hyponatremia: Secondary to DKA. Hyperglycemia is also contributing to hyponatremia/pseudohyponatremia. Improved.  3. Acute renal failure complicating functioning cadaveric renal transplant: Patient had normal creatinine a week ago. Secondary to DKA. Aggressive IV fluid hydration and follow BMP closely. Continue immunosuppressants. 4. Hyperkalemia: Secondary to acute renal failure.  Resolved. 5. Hypotension/hypertension: Secondary to intravascular volume depletion. Hypotension resolved. 6. Nausea and vomiting: Possibly secondary to DKA versus diabetic gastroparesis. IV PPI and when necessary Zofran. Resolved. Start diet with basal insulin regimen. 7. Hypothyroidism: Continue Synthroid. TSH: 1.849. 8. Mild thrombocytopenia:? Secondary to immunosuppressants. Stable. 9. Dyspnea on exertion: Unclear etiology.Patient has some EKG changes compared to prior. No chest pain but a long-standing diabetic.  Check a set of cardiac enzymes-negative. Monitor on telemetry-sinus rhythm. Echo unremarkable. Patient gives history of being told that she has OSA but no formal testing done.? Secondary to obesity/OHS/OSA. 10. Prolonged QTC: Followup EKG after correcting electrolyte abnormalities. Check magnesium-2. EKG to be done.   Code Status: Full Family Communication: Discussed with patient Disposition Plan: Transfer to medical bed after off insulin drip.   Consultants:  Diabetes coordinator  Procedures:  None  Antibiotics:  None   HPI/Subjective: Feels much better. No urinary frequency or dry mouth. No nausea, vomiting or abdominal pain. Asking to eat.  Objective: Filed Vitals:   01/06/13 0148 01/06/13 0400 01/06/13 0800 01/06/13 1200  BP: 136/58  160/54 145/58  Pulse: 81  79 79  Temp:  98.1 F (36.7 C) 97.7 F (36.5 C) 98 F (36.7 C)  TempSrc:  Oral Oral Oral  Resp: 17  21 19   Height:      Weight:  107.2 kg (236 lb 5.3 oz)    SpO2: 95%  96% 91%    Intake/Output Summary (Last 24 hours) at 01/06/13 1412 Last data filed at 01/06/13 1200  Gross per 24 hour  Intake 3387.86 ml  Output   2400 ml  Net 987.86 ml   Filed Weights   01/05/13 0642 01/05/13 1200 01/06/13 0400  Weight: 106.595 kg (235 lb) 107.2 kg (236 lb 5.3 oz) 107.2 kg (236 lb 5.3 oz)    Exam:   General exam: Comfortable.  Looks much better than she did yesterday.  Respiratory system: Clear. No  increased work of breathing.  Cardiovascular system: S1 & S2 heard, RRR. No JVD, murmurs, gallops, clicks or pedal edema. Telemetry shows normal sinus rhythm.  Gastrointestinal system: Abdomen is nondistended, soft and nontender. Normal bowel sounds heard.  Central nervous system: Alert and oriented. No focal neurological deficits.  Extremities: Symmetric 5 x 5 power.   Data Reviewed: Basic Metabolic Panel:  Recent Labs Lab 01/05/13 1130 01/05/13 1316 01/05/13 1441 01/05/13 1704 01/06/13 0330 01/06/13 0823  NA 132* 136 136 136 133* 133*  K 4.9 4.7 4.6 4.6 4.1 4.0  CL 92* 98 102 103 101 104  CO2 8* 10* 14* 18* 19 19  GLUCOSE 584* 375* 289* 195* 194* 186*  BUN 45* 42* 40* 37* 24* 19  CREATININE 1.65* 1.56* 1.47* 1.38* 1.12* 1.01  CALCIUM 9.8 9.8 9.6 9.7 9.1 9.5  MG 2.0  --   --   --   --   --    Liver Function Tests:  Recent Labs Lab 01/05/13 0736  AST 15  ALT 15  ALKPHOS 141*  BILITOT 0.5  PROT 7.7  ALBUMIN 3.8   No results found for this basename: LIPASE, AMYLASE,  in the last 168 hours No results found for this basename: AMMONIA,  in the last 168 hours CBC:  Recent Labs Lab 01/05/13 0736 01/06/13 0330  WBC 6.5 5.2  NEUTROABS 5.5  --   HGB 14.5 13.0  HCT 42.0 39.0  MCV 84.7 84.8  PLT 142* 132*   Cardiac Enzymes:  Recent Labs Lab 01/05/13 1125 01/05/13 1130  CKTOTAL 70  --   CKMB 4.5*  --   TROPONINI  --  <0.30   BNP (last 3 results) No results found for this basename: PROBNP,  in the last 8760 hours CBG:  Recent Labs Lab 01/06/13 0758 01/06/13 0902 01/06/13 1007 01/06/13 1106 01/06/13 1203  GLUCAP 180* 167* 129* 130* 194*    Recent Results (from the past 240 hour(s))  MRSA PCR SCREENING     Status: Abnormal   Collection Time    01/05/13 10:59 AM      Result Value Range Status   MRSA by PCR POSITIVE (*) NEGATIVE Final   Comment:            The GeneXpert MRSA Assay (FDA     approved for NASAL specimens     only), is one  component of a     comprehensive MRSA colonization     surveillance program. It is not     intended to diagnose MRSA     infection nor to guide or     monitor treatment for     MRSA infections.     RESULT CALLED TO, READ BACK BY AND VERIFIED WITH:     Nicanor Bake [id number] @ 1219 BY J SCOTTON     Studies: Dg Chest 2 View  01/05/2013  *RADIOLOGY REPORT*  Clinical Data: Hyperglycemia, chest discomfort  CHEST - 2 VIEW  Comparison: 08/04/2012  Findings: Cardiomediastinal silhouette is stable.  No acute infiltrate or pleural effusion.  No pulmonary edema.  Bony thorax is unremarkable.  IMPRESSION: No active disease.  No significant change.   Original Report Authenticated By: Natasha Mead, M.D.      Additional labs:   Scheduled Meds: . Chlorhexidine Gluconate Cloth  6 each Topical Q0600  . enoxaparin (LOVENOX) injection  40 mg Subcutaneous Q24H  . famotidine (PEPCID) IV  20  mg Intravenous Q12H  . levothyroxine  137 mcg Oral QAC breakfast  . mupirocin ointment  1 application Nasal BID  . mycophenolate  500 mg Oral BID  . predniSONE  5 mg Oral Daily  . tacrolimus  1 mg Oral BID   Continuous Infusions: . sodium chloride Stopped (01/05/13 1551)  . dextrose 5 % and 0.45% NaCl 100 mL/hr at 01/06/13 0800  . insulin (NOVOLIN-R) infusion 1.3 mL/hr at 01/06/13 1200    Principal Problem:   DKA, type 1 Active Problems:   HYPERTENSION   Immunosuppression   Hypothyroidism   Hyperkalemia   Hyponatremia   H/O kidney transplant   Renal failure, acute   Dehydration with hyponatremia    Time spent: 35 mins.    [facility]  [facility] Pager [phone].   If 8PM-8AM, please contact night-coverage at [email or web address], password [facility] 01/06/2013, 2:12 PM  LOS: 1 day

## 2013-01-06 NOTE — ED Provider Notes (Signed)
Medical screening examination/treatment/procedure(s) were conducted as a shared visit with non-physician practitioner(s) and myself.  I personally evaluated the patient during the encounter  Patient with evidence of diabetic ketoacidosis the pH is 7.1.  She is hydrated in the emergency department.  She was started on IV insulin drip.  She was reevaluated multiple times.  She will be admitted to the step down unit.  Her chest x-ray and urine are clear.  Her abdominal exam is benign.  Unclear etiology of why this patient went into diabetic ketoacidosis as she states she's compliant with her insulin and she also states she is compliant with a diabetic diet.  Her symptoms were controlled in the emergency department.  CRITICAL CARE Performed by: Lyanne Co   Total critical care time: 30 Critical care time was exclusive of separately billable procedures and treating other patients. Critical care was necessary to treat or prevent imminent or life-threatening deterioration. Critical care was time spent personally by me on the following activities: development of treatment plan with patient and/or surrogate as well as nursing, discussions with consultants, evaluation of patient's response to treatment, examination of patient, obtaining history from patient or surrogate, ordering and performing treatments and interventions, ordering and review of laboratory studies, ordering and review of radiographic studies, pulse oximetry and re-evaluation of patient's condition.    Lyanne Co, MD 01/06/13 [phone]

## 2013-01-06 NOTE — Progress Notes (Signed)
Patient transferring to room 1520.  Report called to Aram Beecham, Charity fundraiser.  Patient to travel by wheelchair.  Will continue to monitor.

## 2013-01-06 NOTE — Significant Event (Signed)
The computers all reset between 0000-0100-- afterwards I was unable to resume my glucostabilizer data- I had to go in and restart the gtt. And therefore the times are off and I can not access previous CBG reading even on Epic. I will continue the present gtt and finish the treatment according to the glucostabilizer .

## 2013-01-06 NOTE — Progress Notes (Signed)
CARE MANAGEMENT NOTE 01/06/2013  Patient:  [name], [name]   Account Number:  [id number]  Date Initiated:  01/05/2013  Documentation initiated by:  [name]  Subjective/Objective Assessment:   pt with history of copd now with excerbation of the same,     Action/Plan:   home   Anticipated DC Date:  01/08/2013   Anticipated DC Plan:  HOME/SELF CARE  In-house referral  NA      DC Planning Services  CM consult      PAC Choice  NA   Choice offered to / List presented to:  NA   DME arranged  NA      DME agency  NA     HH arranged  NA      HH agency  NA   Status of service:  In process, will continue to follow Medicare Important Message given?  NA - LOS <3 / Initial given by admissions (If response is "NO", the following Medicare IM given date fields will be blank) Date Medicare IM given:   Date Additional Medicare IM given:    Discharge Disposition:    Per UR Regulation:  Reviewed for med. necessity/level of care/duration of stay  If discussed at Long Length of Stay Meetings, dates discussed:    Comments:  [id number]/[name] Lukas,RN,BSN,CCM: spoke with patient concern cost of her insulins, she was not aware of the 4.00 dollar program at [facility]. [employer]ymeds[employer] discount care given to patient and 91% off coupon for humlin given to patient.  [id number]/YNWGNF [name] Plater, RN, BSN, CCM:  CHART REVIEWED AND UPDATED.  Next chart review due on [id number]. NO DISCHARGE NEEDS PRESENT AT THIS TIME. CASE MANAGEMENT [phone]

## 2013-01-07 LAB — BASIC METABOLIC PANEL
Chloride: 103 mEq/L (ref 96–112)
GFR calc Af Amer: 72 mL/min — ABNORMAL LOW (ref >90)
GFR calc non Af Amer: 62 mL/min — ABNORMAL LOW (ref >90)
Potassium: 3.8 mEq/L (ref 3.5–5.1)
Sodium: 134 mEq/L — ABNORMAL LOW (ref 135–145)

## 2013-01-07 LAB — CBC
Platelets: 141 10*3/uL — ABNORMAL LOW (ref 150–400)
RBC: 5.03 MIL/uL (ref 3.87–5.11)
RDW: 13.6 % (ref 11.5–15.5)
WBC: 4.4 10*3/uL (ref 4.0–10.5)

## 2013-01-07 LAB — GLUCOSE, CAPILLARY: Glucose-Capillary: 169 mg/dL — ABNORMAL HIGH (ref 70–99)

## 2013-01-07 MED ORDER — INSULIN ASPART 100 UNIT/ML ~~LOC~~ SOLN: 0.0000 [IU] | Freq: Three times a day (TID) | SUBCUTANEOUS | Status: DC

## 2013-01-07 MED ORDER — INSULIN ASPART 100 UNIT/ML ~~LOC~~ SOLN: 4.0000 [IU] | Freq: Three times a day (TID) | SUBCUTANEOUS | Status: DC

## 2013-01-07 MED ORDER — AMLODIPINE BESYLATE 5 MG PO TABS
5.0000 mg | ORAL_TABLET | Freq: Every day | ORAL | Status: DC
  Administered 2013-01-07: 5 mg via ORAL
  Filled 2013-01-07: qty 1

## 2013-01-07 NOTE — Care Management (Signed)
Received referral for assistance in finding PCP. Provided patient with "My [place]" number [phone] and informed her to press option 2 for MD referrals. Also provided a list of community resources as well.  [place], [place] [phone]

## 2013-01-07 NOTE — Discharge Summary (Signed)
Physician Discharge Summary  DAJANIQUE ROBLEY ZOX:[id number] DOB: 1965/09/09 DOA: 01/05/2013  PCP: [name], MD  Admit date: 01/05/2013 Discharge date: 01/07/2013  Time spent: Greater than 30 minutes  Recommendations for Outpatient Follow-up:  1. Dr. Terrial Rhodes, in 1 week. 2. PCP (patient given information to find new one), in 1 week. 3. Transplant MD at [facility] 4. Consider OP Endocrinology consultation (DM Mx) and Pulmonology consultation (possible OSA work up), ophthalmology (screen for diabetic complications) and Cardiology consultation (long standing dyspnea on exertion).  Discharge Diagnoses:  Principal Problem:   DKA, type 1 Active Problems:   HYPERTENSION   Immunosuppression   Hypothyroidism   Hyperkalemia   Hyponatremia   H/O kidney transplant   Renal failure, acute   Dehydration with hyponatremia   Discharge Condition: Improved & Stable  Diet recommendation: Diabetic & Heart Healthy diet.  Filed Weights   01/05/13 0642 01/05/13 1200 01/06/13 0400  Weight: 106.595 kg (235 lb) 107.2 kg (236 lb 5.3 oz) 107.2 kg (236 lb 5.3 oz)    History of present illness:  47 y.o. 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 She gave approximately a 1-1/2 week history of consistently elevated blood sugars ranging from 300-450 mg/dL. She claimed compliance to her medications but not fully compliant with diabetic diet. In the last 3-4 days PTA, she had noticed increased thirst, urinary frequency, dry mouth, generalized weakness, intermittent nausea and vomiting consisting of clear liquids but no blood or coffee grounds. She was found to be DKA, Acute renal failure and hyperkalemic and was admitted to  the step down unit for further management.  Hospital Course:  1. DKA, in poorly controlled type 1 DM: Patient was admitted to the step down unit. She was treated in the usual fashion with aggressive IV fluids, insulin drip and close monitoring of her BMP. After her acidosis had resolved, she was transitioned to home NPH insulin, SSI and mealtime NovoLog was added. She was transferred to regular medical bed. Counseled patient extensively regarding compliance with medications and diet. She will benefit from OP endocrinology and ophthalmology consultation. Hemoglobin A1c: 9. She will need further adjustment of her medications as outpatient. 2. Dehydration with hyponatremia: Secondary to DKA and hyperglycemia. Resolved with above treatment. 3. Acute renal failure complicating functioning cadaveric renal transplant: Patient had normal creatinine a week PTA. Secondary to DKA. Resolved. Continue immunosuppressants. OP followup with nephrology. 4. Hyperkalemia: Secondary to acute renal failure. Resolved. 5. Hypotension/hypertension: Secondary to intravascular volume depletion. Hypotension resolved. 6. Nausea and vomiting: Possibly secondary to DKA versus diabetic gastroparesis. IV PPI and when necessary Zofran. Resolved. Tolerated diet. 7. Hypothyroidism: Continue Synthroid. TSH: 1.849. 8. Mild thrombocytopenia:? Secondary to immunosuppressants. Stable. 9. Dyspnea on exertion: Unclear etiology.Patient has some EKG changes compared to prior-improved (normal sinus rhythm, right superior axis deviation, RVH, incomplete RBBB. ST and T abnormalities improved). No chest pain but a long-standing diabetic. Cardiac enzymes-unremarkable. Monitor on telemetry-sinus rhythm. Echo unremarkable. Patient gives history of being told that she has OSA but no formal testing done.? Secondary to obesity/OHS/OSA. Consider outpatient cardiology consultation for further evaluation. 10. Prolonged QTC: Improved. 497  msec  Procedures:  None    Consultations:  Diabetes Coordinator  Case management    Discharge Exam:  Complaints: Denies complaints. Patient denied complaints. No further nausea,  vomiting or pain. Tolerating diet.  Filed Vitals:   01/06/13 1905 01/06/13 2200 01/07/13 0200 01/07/13 0600  BP: 175/84 152/82 167/83 150/81  Pulse: 80 79 75 78  Temp: 97.7 F (36.5 C) 98.3 F (36.8 C) 98 F (36.7 C) 98.3 F (36.8 C)  TempSrc: Oral Oral Oral Oral  Resp: 20 18 18 18   Height:      Weight:      SpO2: 93% 95% 94% 95%     General exam: Comfortable.   Respiratory system: Clear. No increased work of breathing.   Cardiovascular system: S1 & S2 heard, RRR. No JVD, murmurs, gallops, clicks or pedal edema.   Gastrointestinal system: Abdomen is nondistended, soft and nontender. Normal bowel sounds heard.   Central nervous system: Alert and oriented. No focal neurological deficits.   Extremities: Symmetric 5 x 5 power  Discharge Instructions      Discharge Orders   Future Orders Complete By Expires     Call MD for:  extreme fatigue  As directed     Call MD for:  persistant dizziness or light-headedness  As directed     Call MD for:  persistant nausea and vomiting  As directed     Diet - low sodium heart healthy  As directed     Diet Carb Modified  As directed     Increase activity slowly  As directed         Medication List    TAKE these medications       amLODipine 5 MG tablet  Commonly known as:  NORVASC  Take 1 tablet (5 mg total) by mouth daily.     carvedilol 25 MG tablet  Commonly known as:  COREG  Take 2 tablets (50 mg total) by mouth 2 (two) times daily.     insulin aspart 100 UNIT/ML injection  Commonly known as:  novoLOG  Inject 0-9 Units into the skin 3 (three) times daily with meals. For blood sugar less than 70: eat or drink something sweet and recheck, 70 - 120: 0 units, 121 - 150: 1 unit, 151 - 200: 2 units, 201 - 250: 3 units, 251 - 300: 5 units,  301 - 350: 7 units, 351 - 400: 9 units & for blood sugar greater than 400: call MD.     insulin aspart 100 UNIT/ML injection  Commonly known as:  novoLOG  Inject 4 Units into the skin 3 (three) times daily with meals.     insulin NPH 100 UNIT/ML injection  Commonly known as:  HUMULIN N,NOVOLIN N  Inject 40 Units into the skin 2 (two) times daily before a meal.     levothyroxine 137 MCG tablet  Commonly known as:  SYNTHROID, LEVOTHROID  Take 137 mcg by mouth daily. Name brand     mycophenolate 250 MG capsule  Commonly known as:  CELLCEPT  Take 500 mg by mouth 2 (two) times daily.     predniSONE 5 MG tablet  Commonly known as:  DELTASONE  Take 5 mg by mouth daily.     ranitidine 150 MG capsule  Commonly known as:  ZANTAC  Take 150 mg by mouth at bedtime.     tacrolimus 1 MG capsule  Commonly known as:  PROGRAF  Take 1 mg by mouth 2 (two) times daily.       Follow-up Information   Follow up with COLADONATO,JOSEPH A, MD. Schedule an appointment as soon as possible for a visit in 1 week.   Contact  information:   [address] [facility] [place] [address] [phone]       Follow up with Primary Medical Doctor. Schedule an appointment as soon as possible for a visit in 1 week.      Schedule an appointment as soon as possible for a visit with Transplant MD at [facility].       The results of significant diagnostics from this hospitalization (including imaging, microbiology, ancillary and laboratory) are listed below for reference.    Significant Diagnostic Studies: Dg Chest 2 View  01/05/2013  *RADIOLOGY REPORT*  Clinical Data: Hyperglycemia, chest discomfort  CHEST - 2 VIEW  Comparison: 08/04/2012  Findings: Cardiomediastinal silhouette is stable.  No acute infiltrate or pleural effusion.  No pulmonary edema.  Bony thorax is unremarkable.  IMPRESSION: No active disease.  No significant change.   Original Report Authenticated By: Natasha Mead, M.D.      Microbiology: Recent Results (from the past 240 hour(s))  MRSA PCR SCREENING     Status: Abnormal   Collection Time    01/05/13 10:59 AM      Result Value Range Status   MRSA by PCR POSITIVE (*) NEGATIVE Final   Comment:            The GeneXpert MRSA Assay (FDA     approved for NASAL specimens     only), is one component of a     comprehensive MRSA colonization     surveillance program. It is not     intended to diagnose MRSA     infection nor to guide or     monitor treatment for     MRSA infections.     RESULT CALLED TO, READ BACK BY AND VERIFIED WITH:     Nicanor Bake [id number] @ 1219 BY J SCOTTON     Labs: Basic Metabolic Panel:  Recent Labs Lab 01/05/13 1130  01/05/13 1441 01/05/13 1704 01/06/13 0330 01/06/13 0823 01/07/13 0600  NA 132*  < > 136 136 133* 133* 134*  K 4.9  < > 4.6 4.6 4.1 4.0 3.8  CL 92*  < > 102 103 101 104 103  CO2 8*  < > 14* 18* 19 19 22   GLUCOSE 584*  < > 289* 195* 194* 186* 175*  BUN 45*  < > 40* 37* 24* 19 13  CREATININE 1.65*  < > 1.47* 1.38* 1.12* 1.01 1.07  CALCIUM 9.8  < > 9.6 9.7 9.1 9.5 9.8  MG 2.0  --   --   --   --   --   --   < > = values in this interval not displayed. Liver Function Tests:  Recent Labs Lab 01/05/13 0736  AST 15  ALT 15  ALKPHOS 141*  BILITOT 0.5  PROT 7.7  ALBUMIN 3.8   No results found for this basename: LIPASE, AMYLASE,  in the last 168 hours No results found for this basename: AMMONIA,  in the last 168 hours CBC:  Recent Labs Lab 01/05/13 0736 01/06/13 0330 01/07/13 0630  WBC 6.5 5.2 4.4  NEUTROABS 5.5  --   --   HGB 14.5 13.0 14.5  HCT 42.0 39.0 43.1  MCV 84.7 84.8 85.7  PLT 142* 132* 141*   Cardiac Enzymes:  Recent Labs Lab 01/05/13 1125 01/05/13 1130  CKTOTAL 70  --   CKMB 4.5*  --   TROPONINI  --  <0.30   BNP: BNP (last 3 results) No results found for this basename:  PROBNP,  in the last 8760 hours CBG:  Recent Labs Lab 01/06/13 1415 01/06/13 1506 01/06/13 1628  01/06/13 2120 01/07/13 0730  GLUCAP 238* 195* 186* 236* 169*    Additional labs:  Venous blood gas on admission: PH 7.131, PCO2 32, PO2 37, bicarbonate 10 and oxygen saturation 59  Magnesium: 2  Urine pregnancy test: Negative  Hemoglobin A1c: 9  TSH: 1.849   Signed:  Kruz Chiu  [facility] 01/07/2013, 11:39 AM

## 2013-01-09 LAB — GLUCOSE, CAPILLARY: Glucose-Capillary: 191 mg/dL — ABNORMAL HIGH (ref 70–99)

## 2013-03-04 ENCOUNTER — Encounter (HOSPITAL_COMMUNITY): Payer: Self-pay | Admitting: Emergency Medicine

## 2013-03-04 ENCOUNTER — Emergency Department (HOSPITAL_COMMUNITY): Payer: Medicare Other

## 2013-03-04 ENCOUNTER — Inpatient Hospital Stay (HOSPITAL_COMMUNITY)
Admission: EM | Admit: 2013-03-04 | Discharge: 2013-03-07 | DRG: 683 | Disposition: A | Discharge status: Home / Self Care | Payer: Medicare Other | Attending: Internal Medicine | Admitting: Internal Medicine

## 2013-03-04 DIAGNOSIS — Z940 Kidney transplant status: Secondary | ICD-10-CM

## 2013-03-04 DIAGNOSIS — R739 Hyperglycemia, unspecified: Secondary | ICD-10-CM

## 2013-03-04 DIAGNOSIS — D72829 Elevated white blood cell count, unspecified: Secondary | ICD-10-CM

## 2013-03-04 DIAGNOSIS — I959 Hypotension, unspecified: Secondary | ICD-10-CM

## 2013-03-04 DIAGNOSIS — I951 Orthostatic hypotension: Secondary | ICD-10-CM | POA: Diagnosis present

## 2013-03-04 DIAGNOSIS — E860 Dehydration: Secondary | ICD-10-CM | POA: Diagnosis present

## 2013-03-04 DIAGNOSIS — Z794 Long term (current) use of insulin: Secondary | ICD-10-CM

## 2013-03-04 DIAGNOSIS — N19 Unspecified kidney failure: Secondary | ICD-10-CM

## 2013-03-04 DIAGNOSIS — E871 Hypo-osmolality and hyponatremia: Secondary | ICD-10-CM

## 2013-03-04 DIAGNOSIS — A419 Sepsis, unspecified organism: Secondary | ICD-10-CM

## 2013-03-04 DIAGNOSIS — F329 Major depressive disorder, single episode, unspecified: Secondary | ICD-10-CM | POA: Diagnosis present

## 2013-03-04 DIAGNOSIS — Z79899 Other long term (current) drug therapy: Secondary | ICD-10-CM

## 2013-03-04 DIAGNOSIS — I120 Hypertensive chronic kidney disease with stage 5 chronic kidney disease or end stage renal disease: Secondary | ICD-10-CM | POA: Diagnosis present

## 2013-03-04 DIAGNOSIS — F3289 Other specified depressive episodes: Secondary | ICD-10-CM | POA: Diagnosis present

## 2013-03-04 DIAGNOSIS — N186 End stage renal disease: Secondary | ICD-10-CM

## 2013-03-04 DIAGNOSIS — R197 Diarrhea, unspecified: Secondary | ICD-10-CM | POA: Diagnosis present

## 2013-03-04 DIAGNOSIS — N179 Acute kidney failure, unspecified: Principal | ICD-10-CM | POA: Diagnosis present

## 2013-03-04 DIAGNOSIS — E1010 Type 1 diabetes mellitus with ketoacidosis without coma: Secondary | ICD-10-CM

## 2013-03-04 DIAGNOSIS — I10 Essential (primary) hypertension: Secondary | ICD-10-CM

## 2013-03-04 DIAGNOSIS — E039 Hypothyroidism, unspecified: Secondary | ICD-10-CM | POA: Diagnosis present

## 2013-03-04 DIAGNOSIS — E875 Hyperkalemia: Secondary | ICD-10-CM

## 2013-03-04 DIAGNOSIS — E119 Type 2 diabetes mellitus without complications: Secondary | ICD-10-CM | POA: Diagnosis present

## 2013-03-04 DIAGNOSIS — T82898A Other specified complication of vascular prosthetic devices, implants and grafts, initial encounter: Secondary | ICD-10-CM

## 2013-03-04 DIAGNOSIS — E872 Acidosis: Secondary | ICD-10-CM

## 2013-03-04 LAB — CBC WITH DIFFERENTIAL/PLATELET
Basophils Absolute: 0 10*3/uL (ref 0.0–0.1)
Basophils Relative: 0 % (ref 0–1)
Eosinophils Absolute: 0 K/uL (ref 0.0–0.7)
Eosinophils Relative: 1 % (ref 0–5)
HCT: 47.3 % — ABNORMAL HIGH (ref 36.0–46.0)
Hemoglobin: 15.7 g/dL — ABNORMAL HIGH (ref 12.0–15.0)
Lymphocytes Relative: 19 % (ref 12–46)
Lymphs Abs: 1.4 10*3/uL (ref 0.7–4.0)
MCH: 28.4 pg (ref 26.0–34.0)
MCHC: 33.2 g/dL (ref 30.0–36.0)
MCV: 85.7 fL (ref 78.0–100.0)
Monocytes Absolute: 0.8 K/uL (ref 0.1–1.0)
Monocytes Relative: 11 % (ref 3–12)
Neutro Abs: 5.3 10*3/uL (ref 1.7–7.7)
Neutrophils Relative %: 70 % (ref 43–77)
Platelets: 188 10*3/uL (ref 150–400)
RBC: 5.52 MIL/uL — ABNORMAL HIGH (ref 3.87–5.11)
RDW: 13.2 % (ref 11.5–15.5)
WBC: 7.6 10*3/uL (ref 4.0–10.5)

## 2013-03-04 LAB — COMPREHENSIVE METABOLIC PANEL WITH GFR
AST: 13 U/L (ref 0–37)
BUN: 58 mg/dL — ABNORMAL HIGH (ref 6–23)
CO2: 24 meq/L (ref 19–32)
Calcium: 11.5 mg/dL — ABNORMAL HIGH (ref 8.4–10.5)
Creatinine, Ser: 2.97 mg/dL — ABNORMAL HIGH (ref 0.50–1.10)
GFR calc non Af Amer: 18 mL/min — ABNORMAL LOW (ref >90)

## 2013-03-04 LAB — COMPREHENSIVE METABOLIC PANEL
ALT: 12 U/L (ref 0–35)
Albumin: 3.9 g/dL (ref 3.5–5.2)
Alkaline Phosphatase: 115 U/L (ref 39–117)
Chloride: 94 mEq/L — ABNORMAL LOW (ref 96–112)
GFR calc Af Amer: 21 mL/min — ABNORMAL LOW (ref >90)
Glucose, Bld: 244 mg/dL — ABNORMAL HIGH (ref 70–99)
Potassium: 4 mEq/L (ref 3.5–5.1)
Sodium: 135 mEq/L (ref 135–145)
Total Bilirubin: 0.5 mg/dL (ref 0.3–1.2)
Total Protein: 8.2 g/dL (ref 6.0–8.3)

## 2013-03-04 LAB — CG4 I-STAT (LACTIC ACID): Lactic Acid, Venous: 2.14 mmol/L (ref 0.5–2.2)

## 2013-03-04 LAB — LIPASE, BLOOD: Lipase: 9 U/L — ABNORMAL LOW (ref 11–59)

## 2013-03-04 MED ORDER — SODIUM CHLORIDE 0.9 % IV BOLUS (SEPSIS)
1000.0000 mL | Freq: Once | INTRAVENOUS | Status: AC
  Administered 2013-03-05: 1000 mL via INTRAVENOUS

## 2013-03-04 MED ORDER — SODIUM CHLORIDE 0.9 % IV BOLUS (SEPSIS)
1000.0000 mL | Freq: Once | INTRAVENOUS | Status: AC
  Administered 2013-03-04: 1000 mL via INTRAVENOUS

## 2013-03-04 NOTE — ED Notes (Signed)
Pt presents to the ED with a complaint of dehydration.  Pt states it began last Wednesday 03/01/2013.

## 2013-03-04 NOTE — ED Provider Notes (Signed)
History    CSN: [id number] Arrival date & time 03/04/13  2107  First MD Initiated Contact with Patient 03/04/13 2148     Chief Complaint  Patient presents with  . Dehydration   (Consider location/radiation/quality/duration/timing/severity/associated sxs/prior Treatment) Patient is a 47 y.o. female presenting with weakness. The history is provided by the patient and a relative. The history is limited by the condition of the patient. No language interpreter was used.  Weakness This is a new problem. The current episode started more than 2 days ago. The problem occurs constantly. The problem has been gradually worsening. Associated symptoms include abdominal pain. Pertinent negatives include no chest pain, no headaches and no shortness of breath. Associated symptoms comments: Cramping of upper legs, and lower abdomen, worse on right side.  Associated with gradually worsening anorexia, no N/V/D.  No fevers, chills, mild dry cough with itchy and scratchy throat.  No redness to throat, or swollen glands.  Has had DKA in the past, but was associated with N/V.  Is a kidney transplant patient with kidney in right lower abd.  . Nothing relieves the symptoms.   Past Medical History  Diagnosis Date  . Hypertension   . Hypothyroid   . Depression   . 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 Kidney disease   . End stage renal disease     right arm AV graft, post transplant  . Diabetes mellitus     Insulin dependant   Past Surgical History  Procedure Laterality Date  . Back surgery  2001  . Tubal ligation    . Dg av dialysis graft declot or    . Nephrectomy transplanted organ    . Kidney transplant  December 16, 2009  . 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  . Cataract extraction w/ intraocular lens implant Right    Family History  Problem Relation Age of Onset  . Emphysema Mother   . Throat cancer Mother   . COPD Mother   . Cancer Mother   . Emphysema Father   . COPD Father    . Stroke Father   . ADD / ADHD Son    History  Substance Use Topics  . Smoking status: Former Smoker -- 1.00 packs/day for 11 years    Types: Cigarettes    Quit date: 09/21/1998  . Smokeless tobacco: Never Used  . Alcohol Use: No   OB History   Grav Para Term Preterm Abortions TAB SAB Ect Mult Living                 Review of Systems  Unable to perform ROS: Acuity of condition  Respiratory: Negative for shortness of breath.   Cardiovascular: Negative for chest pain.  Gastrointestinal: Positive for abdominal pain.  Neurological: Positive for weakness. Negative for headaches.    Allergies  Ace inhibitors and Adhesive  Home Medications   Current Outpatient Rx  Name  Route  Sig  Dispense  Refill  . amLODipine (NORVASC) 5 MG tablet   Oral   Take 1 tablet (5 mg total) by mouth daily.   30 tablet   0   . carvedilol (COREG) 25 MG tablet   Oral   Take 2 tablets (50 mg total) by mouth 2 (two) times daily.   60 tablet   0   . insulin aspart (NOVOLOG) 100 UNIT/ML injection   Subcutaneous   Inject 0-9 Units into the skin 3 (three) times daily with meals. For blood  sugar less than 70: eat or drink something sweet and recheck, 70 - 120: 0 units, 121 - 150: 1 unit, 151 - 200: 2 units, 201 - 250: 3 units, 251 - 300: 5 units, 301 - 350: 7 units, 351 - 400: 9 units & for blood sugar greater than 400: call MD.   1 vial   0   . insulin NPH (HUMULIN N,NOVOLIN N) 100 UNIT/ML injection   Subcutaneous   Inject 40 Units into the skin 2 (two) times daily before a meal.         . levothyroxine (SYNTHROID, LEVOTHROID) 137 MCG tablet   Oral   Take 137 mcg by mouth daily. Name brand         . mycophenolate (CELLCEPT) 250 MG capsule   Oral   Take 500 mg by mouth 2 (two) times daily.          . predniSONE (DELTASONE) 5 MG tablet   Oral   Take 5 mg by mouth daily.           . ranitidine (ZANTAC) 150 MG capsule   Oral   Take 150 mg by mouth at bedtime.          .  tacrolimus (PROGRAF) 1 MG capsule   Oral   Take 1 mg by mouth 2 (two) times daily.           BP 80/46  Pulse 76  Temp(Src) 99.8 F (37.7 C) (Rectal)  Resp 20  Wt 240 lb (108.863 kg)  BMI 34.44 kg/m2  SpO2 99%  LMP 11/19/2011 Physical Exam  Nursing note and vitals reviewed. Constitutional: She is oriented to person, place, and time. She appears well-developed and well-nourished. She appears ill. No distress.  HENT:  Head: Normocephalic and atraumatic.  Right Ear: Hearing and external ear normal.  Left Ear: Hearing and external ear normal.  Mouth/Throat: Uvula is midline. Mucous membranes are pale and dry. No oropharyngeal exudate or posterior oropharyngeal edema.  Eyes: EOM are normal.  Neck: Normal range of motion. Neck supple.  Cardiovascular: Normal rate and intact distal pulses.   No murmur heard. Pulmonary/Chest: Effort normal.  Abdominal: Soft. She exhibits no distension. There is no tenderness.  Neurological: She is alert and oriented to person, place, and time. She exhibits normal muscle tone. Coordination normal.  Skin: Skin is warm. No rash noted. She is not diaphoretic. There is pallor.  Psychiatric: She has a normal mood and affect.    ED Course  Procedures (including critical care time) Labs Reviewed  CBC WITH DIFFERENTIAL - Abnormal; Notable for the following:    RBC 5.52 (*)    Hemoglobin 15.7 (*)    HCT 47.3 (*)    All other components within normal limits  COMPREHENSIVE METABOLIC PANEL - Abnormal; Notable for the following:    Chloride 94 (*)    Glucose, Bld 244 (*)    BUN 58 (*)    Creatinine, Ser 2.97 (*)    Calcium 11.5 (*)    GFR calc non Af Amer 18 (*)    GFR calc Af Amer 21 (*)    All other components within normal limits  LIPASE, BLOOD - Abnormal; Notable for the following:    Lipase 9 (*)    All other components within normal limits  CULTURE, BLOOD (ROUTINE X 2)  CULTURE, BLOOD (ROUTINE X 2)  URINALYSIS, ROUTINE W REFLEX MICROSCOPIC   CG4 I-STAT (LACTIC ACID)   No results found. 1. Renal failure  2. Hypotension   3. Hyperglycemia   4. History of renal transplant     RA sat is 99% and iinterpret to be normal.  ECG at time 22:02 shows SR at rate 75, RBBB and LPFB.  No ST or T wave abns.  Abn ECG.    11:39 PM Spoke to Dr. Lowell Guitar who recommends a transplant U/S, but can wait until tomorrow.  Someone from nephrology will see pt tomorrow.  He is hopeful that with IVF's, BUN and Cr may improve.  No other current recommendations from his standpoint.      1:03 AM Spoke to [facility] who agrees to admit to tele bed  MDM  Pt with fatigue, vague weakness, unable to eat or drink.  Electrolytes suggest sig dehydration associated with hypotension, likely hypovolemia, no fever, doubt sepsis.  Kidney rejection is on differential.  Will discuss with renal and likely admit to hospitalist.  CMP does not suggest DKA and pt has had no emesis.  However anion gap is elevated at 17.  Glucose is elevated, but just at 244.  UA is pending.  Lactic acid is normal at 2.14.    Gavin Pound. Adhvik Canady, MD 03/05/13 4098

## 2013-03-05 ENCOUNTER — Inpatient Hospital Stay (HOSPITAL_COMMUNITY): Payer: Medicare Other

## 2013-03-05 DIAGNOSIS — Z940 Kidney transplant status: Secondary | ICD-10-CM

## 2013-03-05 DIAGNOSIS — E119 Type 2 diabetes mellitus without complications: Secondary | ICD-10-CM | POA: Diagnosis present

## 2013-03-05 DIAGNOSIS — N19 Unspecified kidney failure: Secondary | ICD-10-CM

## 2013-03-05 LAB — URINE MICROSCOPIC-ADD ON

## 2013-03-05 LAB — URINALYSIS, ROUTINE W REFLEX MICROSCOPIC
Ketones, ur: NEGATIVE mg/dL
Protein, ur: 30 mg/dL — AB
Urobilinogen, UA: 0.2 mg/dL (ref 0.0–1.0)

## 2013-03-05 LAB — CBC
HCT: 43.8 % (ref 36.0–46.0)
RDW: 13.3 % (ref 11.5–15.5)
WBC: 5.8 10*3/uL (ref 4.0–10.5)

## 2013-03-05 LAB — HEMOGLOBIN A1C
Hgb A1c MFr Bld: 9.3 % — ABNORMAL HIGH (ref <5.7)
Mean Plasma Glucose: 220 mg/dL — ABNORMAL HIGH (ref <117)

## 2013-03-05 LAB — GLUCOSE, CAPILLARY
Glucose-Capillary: 236 mg/dL — ABNORMAL HIGH (ref 70–99)
Glucose-Capillary: 329 mg/dL — ABNORMAL HIGH (ref 70–99)
Glucose-Capillary: 356 mg/dL — ABNORMAL HIGH (ref 70–99)
Glucose-Capillary: 243 mg/dL — ABNORMAL HIGH (ref 70–99)
Glucose-Capillary: 147 mg/dL — ABNORMAL HIGH (ref 70–99)

## 2013-03-05 LAB — BASIC METABOLIC PANEL
BUN: 59 mg/dL — ABNORMAL HIGH (ref 6–23)
CO2: 21 mEq/L (ref 19–32)
GFR calc non Af Amer: 20 mL/min — ABNORMAL LOW (ref >90)
Glucose, Bld: 279 mg/dL — ABNORMAL HIGH (ref 70–99)
Potassium: 3.5 mEq/L (ref 3.5–5.1)

## 2013-03-05 LAB — MRSA PCR SCREENING: MRSA by PCR: POSITIVE — AB

## 2013-03-05 MED ORDER — INSULIN ASPART 100 UNIT/ML ~~LOC~~ SOLN: 0.0000 [IU] | Freq: Every day | SUBCUTANEOUS | Status: DC

## 2013-03-05 MED ORDER — SODIUM CHLORIDE 0.9 % IV SOLN: INTRAVENOUS | Status: AC

## 2013-03-05 MED ORDER — MYCOPHENOLATE MOFETIL 250 MG PO CAPS
500.0000 mg | ORAL_CAPSULE | Freq: Two times a day (BID) | ORAL | Status: DC
  Administered 2013-03-05 – 2013-03-07 (×5): 500 mg via ORAL
  Filled 2013-03-05 (×6): qty 2

## 2013-03-05 MED ORDER — CHLORHEXIDINE GLUCONATE CLOTH 2 % EX PADS
6.0000 | MEDICATED_PAD | Freq: Every day | CUTANEOUS | Status: DC
  Administered 2013-03-05 – 2013-03-07 (×3): 6 via TOPICAL

## 2013-03-05 MED ORDER — INSULIN NPH (HUMAN) (ISOPHANE) 100 UNIT/ML ~~LOC~~ SUSP
40.0000 [IU] | Freq: Two times a day (BID) | SUBCUTANEOUS | Status: DC
  Administered 2013-03-05 – 2013-03-07 (×5): 40 [IU] via SUBCUTANEOUS
  Filled 2013-03-05 (×2): qty 10

## 2013-03-05 MED ORDER — POTASSIUM CHLORIDE CRYS ER 20 MEQ PO TBCR
40.0000 meq | EXTENDED_RELEASE_TABLET | Freq: Two times a day (BID) | ORAL | Status: DC
  Administered 2013-03-05 – 2013-03-07 (×4): 40 meq via ORAL
  Filled 2013-03-05 (×5): qty 2

## 2013-03-05 MED ORDER — SODIUM CHLORIDE 0.9 % IV SOLN: Freq: Once | INTRAVENOUS | Status: AC

## 2013-03-05 MED ORDER — ONDANSETRON HCL 4 MG/2ML IJ SOLN: 4.0000 mg | Freq: Four times a day (QID) | INTRAMUSCULAR | Status: DC | PRN

## 2013-03-05 MED ORDER — INSULIN ASPART 100 UNIT/ML ~~LOC~~ SOLN
0.0000 [IU] | Freq: Three times a day (TID) | SUBCUTANEOUS | Status: DC
  Administered 2013-03-05: 1 [IU] via SUBCUTANEOUS
  Administered 2013-03-05: 7 [IU] via SUBCUTANEOUS
  Administered 2013-03-05: 3 [IU] via SUBCUTANEOUS
  Administered 2013-03-06 – 2013-03-07 (×2): 1 [IU] via SUBCUTANEOUS

## 2013-03-05 MED ORDER — ENOXAPARIN SODIUM 40 MG/0.4ML ~~LOC~~ SOLN
40.0000 mg | SUBCUTANEOUS | Status: DC
  Administered 2013-03-05 – 2013-03-06 (×2): 40 mg via SUBCUTANEOUS
  Filled 2013-03-05 (×3): qty 0.4

## 2013-03-05 MED ORDER — ONDANSETRON HCL 4 MG PO TABS: 4.0000 mg | ORAL_TABLET | Freq: Four times a day (QID) | ORAL | Status: DC | PRN

## 2013-03-05 MED ORDER — SENNA 8.6 MG PO TABS
1.0000 | ORAL_TABLET | Freq: Two times a day (BID) | ORAL | Status: DC
  Administered 2013-03-06 (×2): 8.6 mg via ORAL
  Filled 2013-03-05 (×3): qty 1

## 2013-03-05 MED ORDER — ACETAMINOPHEN 325 MG PO TABS
650.0000 mg | ORAL_TABLET | Freq: Four times a day (QID) | ORAL | Status: DC | PRN
  Administered 2013-03-05 – 2013-03-07 (×5): 650 mg via ORAL
  Filled 2013-03-05 (×6): qty 2

## 2013-03-05 MED ORDER — DOCUSATE SODIUM 100 MG PO CAPS
100.0000 mg | ORAL_CAPSULE | Freq: Two times a day (BID) | ORAL | Status: DC
Start: 2013-03-05 — End: 2013-03-07
  Administered 2013-03-06 – 2013-03-07 (×3): 100 mg via ORAL
  Filled 2013-03-05 (×6): qty 1

## 2013-03-05 MED ORDER — SODIUM CHLORIDE 0.9 % IV SOLN
INTRAVENOUS | Status: DC
  Administered 2013-03-06 – 2013-03-07 (×3): via INTRAVENOUS

## 2013-03-05 MED ORDER — ACETAMINOPHEN 650 MG RE SUPP: 650.0000 mg | Freq: Four times a day (QID) | RECTAL | Status: DC | PRN

## 2013-03-05 MED ORDER — SODIUM CHLORIDE 0.9 % IJ SOLN
3.0000 mL | Freq: Two times a day (BID) | INTRAMUSCULAR | Status: DC
  Administered 2013-03-05 – 2013-03-06 (×2): 3 mL via INTRAVENOUS

## 2013-03-05 MED ORDER — TACROLIMUS 1 MG PO CAPS
1.0000 mg | ORAL_CAPSULE | Freq: Two times a day (BID) | ORAL | Status: DC
  Administered 2013-03-05 – 2013-03-07 (×5): 1 mg via ORAL
  Filled 2013-03-05 (×6): qty 1

## 2013-03-05 MED ORDER — PREDNISONE 5 MG PO TABS
5.0000 mg | ORAL_TABLET | Freq: Every day | ORAL | Status: DC
  Administered 2013-03-05 – 2013-03-07 (×3): 5 mg via ORAL
  Filled 2013-03-05 (×3): qty 1

## 2013-03-05 MED ORDER — INSULIN ASPART 100 UNIT/ML ~~LOC~~ SOLN
10.0000 [IU] | Freq: Once | SUBCUTANEOUS | Status: AC
  Administered 2013-03-05: 10 [IU] via SUBCUTANEOUS

## 2013-03-05 MED ORDER — MUPIROCIN 2 % EX OINT
1.0000 "application " | TOPICAL_OINTMENT | Freq: Two times a day (BID) | CUTANEOUS | Status: DC
  Administered 2013-03-05 – 2013-03-07 (×5): 1 via NASAL
  Filled 2013-03-05: qty 22

## 2013-03-05 MED ORDER — LEVOTHYROXINE SODIUM 137 MCG PO TABS
137.0000 ug | ORAL_TABLET | Freq: Every day | ORAL | Status: DC
  Administered 2013-03-05 – 2013-03-07 (×3): 137 ug via ORAL
  Filled 2013-03-05 (×4): qty 1

## 2013-03-05 NOTE — Progress Notes (Signed)
MRSA swab comes back POSITIVE - placed on precautions - pt states "I always have to be on precautions when I am here at the hospital and I have done the nose ointment at home for as long as they told me to do so".

## 2013-03-05 NOTE — H&P (Signed)
[facility] History and Physical  [name] ZOX:[id number] DOB: 1965-10-18 DOA: 03/04/2013  Referring physician: Dr Oletta Lamas PCP: [name], MD  Specialists: Dr Arrie Aran.  Chief Complaint: generalized weakness since 5 days, .   HPI: [name] is a 47 y.o. female with prior h/o renal transplant in 2011 April, diabetes and hypertension, hypothyroidism, reports generalized weakness for over 5 days associated with decreased appetite, decreased urine output. Pt reports being dehydrated, and dizziness . No nausea or vomiting.  Low grade temp on admission. On arrival she was found to be hypotensive , and orthostatic, in acute on chronic renal failure with a creatinine of 2.97. CXR does not show any active disease. Blood cultures are drawn on admission. She was started on IV fluids and her BP improved to 90/70's. She is currently asymptomatic. Renal consulted by EDP recommended US of the transplant kidney with dopplers and will see the patient in am. She will be admitted to hospitalist service for further management. She also reports occasional abdominal and leg cramps.   Review of Systems: The patient denies , weight loss,, vision loss, decreased hearing, hoarseness, chest pain, syncope,  peripheral edema, balance deficits, hemoptysis, melena, hematochezia, severe indigestion/heartburn, hematuria, incontinence, genital sores,  suspicious skin lesions, transient blindness, depression, unusual weight change, abnormal bleeding, enlarged lymph nodes, angioedema, and breast masses.    Past Medical History  Diagnosis Date  . Hypertension   . Hypothyroid   . Depression   . 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 Kidney disease   . End stage renal disease     right arm AV graft, post transplant  . Diabetes mellitus     Insulin dependant   Past Surgical History  Procedure Laterality Date  . Back surgery  2001  . Tubal ligation    . Dg av dialysis graft declot  or    . Nephrectomy transplanted organ    . Kidney transplant  December 16, 2009  . 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  . Cataract extraction w/ intraocular lens implant Right    Social History:  reports that she quit smoking about 14 years ago. Her smoking use included Cigarettes. She has a 11 pack-year smoking history. She has never used smokeless tobacco. She reports that she does not drink alcohol or use illicit drugs.  where does patient live--home,  Allergies  Allergen Reactions  . Ace Inhibitors Other (See Comments)    Cough   ( Lisinopril )  . Adhesive (Tape) Itching    Family History  Problem Relation Age of Onset  . Emphysema Mother   . Throat cancer Mother   . COPD Mother   . Cancer Mother   . Emphysema Father   . COPD Father   . Stroke Father   . ADD / ADHD Son     Prior to Admission medications   Medication Sig Start Date End Date Taking? Authorizing Provider  amLODipine (NORVASC) 5 MG tablet Take 1 tablet (5 mg total) by mouth daily. 08/08/12  Yes Alison Murray, MD  carvedilol (COREG) 25 MG tablet Take 2 tablets (50 mg total) by mouth 2 (two) times daily. 08/08/12  Yes Alison Murray, MD  insulin aspart (NOVOLOG) 100 UNIT/ML injection Inject 0-9 Units into the skin 3 (three) times daily with meals. For blood sugar less than 70: eat or drink something sweet and recheck, 70 - 120: 0 units, 121 - 150: 1 unit, 151 - 200: 2 units,  201 - 250: 3 units, 251 - 300: 5 units, 301 - 350: 7 units, 351 - 400: 9 units & for blood sugar greater than 400: call MD. 01/07/13  Yes Elease Etienne, MD  insulin NPH (HUMULIN N,NOVOLIN N) 100 UNIT/ML injection Inject 40 Units into the skin 2 (two) times daily before a meal.   Yes Historical Provider, MD  levothyroxine (SYNTHROID, LEVOTHROID) 137 MCG tablet Take 137 mcg by mouth daily. Name brand   Yes Historical Provider, MD  mycophenolate (CELLCEPT) 250 MG capsule Take 500 mg by mouth 2 (two) times daily.    Yes Historical Provider, MD   predniSONE (DELTASONE) 5 MG tablet Take 5 mg by mouth daily.     Yes Historical Provider, MD  ranitidine (ZANTAC) 150 MG capsule Take 150 mg by mouth at bedtime.    Yes Historical Provider, MD  tacrolimus (PROGRAF) 1 MG capsule Take 1 mg by mouth 2 (two) times daily.    Yes Historical Provider, MD   Physical Exam: Filed Vitals:   03/05/13 0028 03/05/13 0049 03/05/13 0051 03/05/13 0053  BP: 98/50 96/82 130/72 92/42  Pulse: 69 69 72 72  Temp:      TempSrc:      Resp: 18     Weight:      SpO2: 99%       Constitutional: Vital signs reviewed.  Patient is a well-developed and well-nourished  in no acute distress and cooperative with exam. Alert and oriented x3.  Head: Normocephalic and atraumatic Mouth: no erythema or exudates, MMM Eyes: PERRL, EOMI, conjunctivae normal, No scleral icterus.  Neck: Supple, Trachea midline normal ROM, No JVD, mass, thyromegaly, or carotid bruit present.  Cardiovascular: RRR, S1 normal, S2 normal, no MRG, pulses symmetric and intact bilaterally Pulmonary/Chest: normal respiratory effort, CTAB, no wheezes, rales, or rhonchi Abdominal: Soft. Non-tender, non-distended, bowel sounds are normal, no masses, organomegaly, or guarding present.  Musculoskeletal:chronic skin changes over the lower extremities. 3 toe amputations over the RLE.  Neurological: A&O x3, Strength is normal and symmetric bilaterally Skin: Warm, dry and intact. No rash, cyanosis, or clubbing.  Psychiatric: Normal mood and affect.    Labs on Admission:  Basic Metabolic Panel:  Recent Labs Lab 03/04/13 2245  NA 135  K 4.0  CL 94*  CO2 24  GLUCOSE 244*  BUN 58*  CREATININE 2.97*  CALCIUM 11.5*   Liver Function Tests:  Recent Labs Lab 03/04/13 2245  AST 13  ALT 12  ALKPHOS 115  BILITOT 0.5  PROT 8.2  ALBUMIN 3.9    Recent Labs Lab 03/04/13 2245  LIPASE 9*   No results found for this basename: AMMONIA,  in the last 168 hours CBC:  Recent Labs Lab 03/04/13 2245   WBC 7.6  NEUTROABS 5.3  HGB 15.7*  HCT 47.3*  MCV 85.7  PLT 188   Cardiac Enzymes: No results found for this basename: CKTOTAL, CKMB, CKMBINDEX, TROPONINI,  in the last 168 hours  BNP (last 3 results) No results found for this basename: PROBNP,  in the last 8760 hours CBG: No results found for this basename: GLUCAP,  in the last 168 hours  Radiological Exams on Admission: Dg Chest Port 1 View  03/05/2013   *RADIOLOGY REPORT*  Clinical Data: Weakness.  Dehydration.  Cough.  Diabetic.  PORTABLE CHEST - 1 VIEW  Comparison: 01/05/2013  Findings: Numerous leads and wires project over the chest.  Midline trachea.  Normal heart size and mediastinal contours. No pleural effusion or pneumothorax.  Clear lungs.  IMPRESSION: Normal chest.   Original Report Authenticated By: Jeronimo Greaves, M.D.    EKG: NOT DONE.   Assessment/Plan Active Problems:    1. Generalized weakness/ hypotension/ dizziness:  possibly secondary to dehydration. Started on IV hydration.   2. Acute on CKD; most likely secondary to dehydration and hypoperfusion. The differential includes rejection of the transplant kidney.  - US of the tranplant kidney with doppler  - UA ordered.  - blood cultures ordered to evaluate for infection.  - renal consulted by EDP, will be seen in am.   3. Hypercalcemia; probably from dehydration. IV hydration and recheck labs in am  4. Elevated hematocrit: reflects dehydration.   5. Hypertension: currently her BP parameters are borderline. Medications are hold.   6. Diabetes Mellitus: hgba1c ordered.  - resume home medications, SSI.   7. H/o renal transplant on the right side: resume home cell cept and prograf and prednisone.   8. Hypothyroidism: resume synthroid.   9. DVT prophylaxis.   Code Status:full code Family Communication: none at bedside. Disposition Plan: pending.   Time spent: 65 min  Emry Tobin [facility] Pager [phone]  If 7PM-7AM, please contact  night-coverage [email or web address] Password [facility] 03/05/2013, 1:39 AM

## 2013-03-05 NOTE — Progress Notes (Signed)
[facility] PROGRESS NOTE  Assessment/Plan: *Renal failure, acute and hypotension: - baseline Cr. 1.0, started on IV fluids creatinine improving. - U/A negative for WBC, U/S: Right lower quadrant renal transplant is within normal limits - B-met in am.  - encourage to drink water.  Diabetes mellitus: - cont NPH and SSI.   H/O kidney transplant: - follow up with nephrologist.  Hypercalcemia: - resolved with IV fluids.  Hypothyroidism: - cont synthroid.  Hypertension: - Cont to hold Medications.   Code Status: full Family Communication: none  Disposition Plan: obsevration   Consultants:  none  Procedures:  Renal ultrasound  Antibiotics:  None  HPI/Subjective: Relates she feels slightly better  Objective: Filed Vitals:   03/05/13 0051 03/05/13 0053 03/05/13 0300 03/05/13 0621  BP: 130/72 92/42 113/54 122/51  Pulse: 72 72 71 81  Temp:   97.7 F (36.5 C) 97.4 F (36.3 C)  TempSrc:   Oral Oral  Resp:   20 16  Height:   5\' 8"  (1.727 m)   Weight:   105.008 kg (231 lb 8 oz)   SpO2:   99% 95%    Intake/Output Summary (Last 24 hours) at 03/05/13 1037 Last data filed at 03/05/13 0700  Gross per 24 hour  Intake    710 ml  Output    300 ml  Net    410 ml   Filed Weights   03/04/13 2139 03/05/13 0300  Weight: 108.863 kg (240 lb) 105.008 kg (231 lb 8 oz)    Exam:  General: Alert, awake, oriented x3, in no acute distress.  HEENT: No bruits, no goiter.  Heart: Regular rate and rhythm, without murmurs, rubs, gallops.  Lungs: Good air movement, clear to auscultation  Abdomen: Soft, nontender, nondistended, positive bowel sounds.     Data Reviewed: Basic Metabolic Panel:  Recent Labs Lab 03/04/13 2245 03/05/13 0424  NA 135 136  K 4.0 3.5  CL 94* 100  CO2 24 21  GLUCOSE 244* 279*  BUN 58* 59*  CREATININE 2.97* 2.69*  CALCIUM 11.5* 10.3   Liver Function Tests:  Recent Labs Lab 03/04/13 2245  AST 13  ALT 12  ALKPHOS 115  BILITOT  0.5  PROT 8.2  ALBUMIN 3.9    Recent Labs Lab 03/04/13 2245  LIPASE 9*   No results found for this basename: AMMONIA,  in the last 168 hours CBC:  Recent Labs Lab 03/04/13 2245 03/05/13 0424  WBC 7.6 5.8  NEUTROABS 5.3  --   HGB 15.7* 14.0  HCT 47.3* 43.8  MCV 85.7 86.7  PLT 188 145*   Cardiac Enzymes: No results found for this basename: CKTOTAL, CKMB, CKMBINDEX, TROPONINI,  in the last 168 hours BNP (last 3 results) No results found for this basename: PROBNP,  in the last 8760 hours CBG:  Recent Labs Lab 03/05/13 0310 03/05/13 0721  GLUCAP 236* 329*    Recent Results (from the past 240 hour(s))  MRSA PCR SCREENING     Status: Abnormal   Collection Time    03/05/13  3:45 AM      Result Value Range Status   MRSA by PCR POSITIVE (*) NEGATIVE Final   Comment:            The GeneXpert MRSA Assay (FDA     approved for NASAL specimens     only), is one component of a     comprehensive MRSA colonization     surveillance program. It is not     intended  to diagnose MRSA     infection nor to guide or     monitor treatment for     MRSA infections.     RESULT CALLED TO, READ BACK BY AND VERIFIED WITH:     Jerold Coombe [id number] @ 0511 BY J SCOTTON     Studies: US Renal Transplant W/doppler  03/05/2013   *RADIOLOGY REPORT*  Clinical Data:  Renal failure  ULTRASOUND OF RENAL TRANSPLANT  Technique: Ultrasound examination of the renal transplant was performed with gray-scale and color Doppler evaluation.  Comparison:  None.  Findings:  There is no perinephric fluid collection.  Doppler analysis demonstrates a low resistance pattern for the interlobar are renal artery branches.  Transplant kidney location:  Right lower quadrant  Transplant kidney description:  Normal in size and parenchymal echogenicity. No evidence of mass or hydronephrosis.  Resistive indices are as follows:  Main renal artery:  0.81.  Right upper pole interlobar artery:  0.76.  Right lower pole  interlobar artery:  0.79.  Color flow in the main renal artery at the hilum:  Present.  Color flow in the main renal vein at the hilum:  Present.  Bladder:  Decompressed.  Other findings:  None  IMPRESSION: Right lower quadrant renal transplant is within normal limits.  No evidence of hydronephrosis.  Resistive indices are described above.   Original Report Authenticated By: Jolaine Click, M.D.   Dg Chest Port 1 View  03/05/2013   *RADIOLOGY REPORT*  Clinical Data: Weakness.  Dehydration.  Cough.  Diabetic.  PORTABLE CHEST - 1 VIEW  Comparison: 01/05/2013  Findings: Numerous leads and wires project over the chest.  Midline trachea.  Normal heart size and mediastinal contours. No pleural effusion or pneumothorax.  Clear lungs.  IMPRESSION: Normal chest.   Original Report Authenticated By: Jeronimo Greaves, M.D.    Scheduled Meds: . sodium chloride   Intravenous STAT  . sodium chloride   Intravenous Once  . Chlorhexidine Gluconate Cloth  6 each Topical Q0600  . docusate sodium  100 mg Oral BID  . enoxaparin (LOVENOX) injection  40 mg Subcutaneous Q24H  . insulin aspart  0-5 Units Subcutaneous QHS  . insulin aspart  0-9 Units Subcutaneous TID WC  . insulin NPH  40 Units Subcutaneous BID AC  . levothyroxine  137 mcg Oral QAC breakfast  . mupirocin ointment  1 application Nasal BID  . mycophenolate  500 mg Oral BID  . predniSONE  5 mg Oral Daily  . senna  1 tablet Oral BID  . sodium chloride  3 mL Intravenous Q12H  . tacrolimus  1 mg Oral BID   Continuous Infusions: . sodium chloride       Marinda Elk  [facility] Pager [phone]. If 8PM-8AM, please contact night-coverage at [email or web address], password [facility] 03/05/2013, 10:37 AM  LOS: 1 day

## 2013-03-05 NOTE — Progress Notes (Signed)
Urine output from 0300 to 0700 is 200ccs

## 2013-03-05 NOTE — Progress Notes (Signed)
HPI: I was asked by Dr. David Stall to see [name] who is a 47 y.o. female with a history of a deceased donor kidney transplant 2011 for ESRD due to diabetes with s Creat of .07 on 01/07/13 who c/o malaise onset Wednesday PTA with intermittent diarrhea and decreased PO intake.  She began noticing diminished urine output and orthostatic symptoms late in the week.  She presented to the ER last night and was found to have a sCreat of 2.97mg /dl, Hgb 16.1WR, urine no WBCs, CXR neg.  She has not had recent antibiotics.She is on standard triple immuno suppresive therapy to prevent rejection.  Past Medical History  Diagnosis Date  . Hypertension   . Hypothyroid   . Depression   . 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 Kidney disease   . End stage renal disease     right arm AV graft, post transplant  . Diabetes mellitus     Insulin dependant   Past Surgical History  Procedure Laterality Date  . Back surgery  2001  . Tubal ligation    . Dg av dialysis graft declot or    . Nephrectomy transplanted organ    . Kidney transplant  December 16, 2009  . 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  . Cataract extraction w/ intraocular lens implant Right    Social History:  reports that she quit smoking about 14 years ago. Her smoking use included Cigarettes. She has a 11 pack-year smoking history. She has never used smokeless tobacco. She reports that she does not drink alcohol or use illicit drugs. Allergies:  Allergies  Allergen Reactions  . Ace Inhibitors Other (See Comments)    Cough   ( Lisinopril )  . Adhesive (Tape) Itching   Family History  Problem Relation Age of Onset  . Emphysema Mother   . Throat cancer Mother   . COPD Mother   . Cancer Mother   . Emphysema Father   . COPD Father   . Stroke Father   . ADD / ADHD Son     Medications:  Scheduled: . sodium chloride   Intravenous STAT  . sodium chloride   Intravenous Once  . Chlorhexidine Gluconate Cloth  6  each Topical Q0600  . docusate sodium  100 mg Oral BID  . enoxaparin (LOVENOX) injection  40 mg Subcutaneous Q24H  . insulin aspart  0-5 Units Subcutaneous QHS  . insulin aspart  0-9 Units Subcutaneous TID WC  . insulin NPH  40 Units Subcutaneous BID AC  . levothyroxine  137 mcg Oral QAC breakfast  . mupirocin ointment  1 application Nasal BID  . mycophenolate  500 mg Oral BID  . predniSONE  5 mg Oral Daily  . senna  1 tablet Oral BID  . sodium chloride  3 mL Intravenous Q12H  . tacrolimus  1 mg Oral BID   ROS: essentially nonconrtibutory to HPI  Blood pressure 112/45, pulse 77, temperature 97.6 F (36.4 C), temperature source Oral, resp. rate 18, height 5\' 8"  (1.727 m), weight 105.008 kg (231 lb 8 oz), last menstrual period 11/19/2011, SpO2 98.00%. General appearance: alert and cooperative Head: Normocephalic, without obvious abnormality, atraumatic Eyes: negative Ears: normal TM's and external ear canals both ears Resp: clear to auscultation bilaterally Chest wall: no tenderness Cardio: regular rate and rhythm, S1, S2 normal, no murmur, click, rub or gallop GI: soft, non-tender; bowel sounds normal; no masses,  no organomegaly Extremities: chronic changes in  pretibial area Skin: chronic chgs pretib, failed rue AVG, multiple tatoos Neurologic: Grossly normal Results for orders placed during the hospital encounter of 03/04/13 (from the past 48 hour(s))  HEMOGLOBIN A1C     Status: Abnormal   Collection Time    03/04/13 10:15 PM      Result Value Range   Hemoglobin A1C 9.3 (*) <5.7 %   Comment: (NOTE)                                                                               According to the ADA Clinical Practice Recommendations for 2011, when     HbA1c is used as a screening test:      >=6.5%   Diagnostic of Diabetes Mellitus               (if abnormal result is confirmed)     5.7-6.4%   Increased risk of developing Diabetes Mellitus     References:Diagnosis and  Classification of Diabetes Mellitus,Diabetes     Care,2011,34(Suppl 1):S62-S69 and Standards of Medical Care in             Diabetes - 2011,Diabetes Care,2011,34 (Suppl 1):S11-S61.   Mean Plasma Glucose 220 (*) <117 mg/dL  CBC WITH DIFFERENTIAL     Status: Abnormal   Collection Time    03/04/13 10:45 PM      Result Value Range   WBC 7.6  4.0 - 10.5 K/uL   RBC 5.52 (*) 3.87 - 5.11 MIL/uL   Hemoglobin 15.7 (*) 12.0 - 15.0 g/dL   HCT 16.1 (*) 09.6 - 04.5 %   MCV 85.7  78.0 - 100.0 fL   MCH 28.4  26.0 - 34.0 pg   MCHC 33.2  30.0 - 36.0 g/dL   RDW 40.9  81.1 - 91.4 %   Platelets 188  150 - 400 K/uL   Neutrophils Relative % 70  43 - 77 %   Neutro Abs 5.3  1.7 - 7.7 K/uL   Lymphocytes Relative 19  12 - 46 %   Lymphs Abs 1.4  0.7 - 4.0 K/uL   Monocytes Relative 11  3 - 12 %   Monocytes Absolute 0.8  0.1 - 1.0 K/uL   Eosinophils Relative 1  0 - 5 %   Eosinophils Absolute 0.0  0.0 - 0.7 K/uL   Basophils Relative 0  0 - 1 %   Basophils Absolute 0.0  0.0 - 0.1 K/uL  COMPREHENSIVE METABOLIC PANEL     Status: Abnormal   Collection Time    03/04/13 10:45 PM      Result Value Range   Sodium 135  135 - 145 mEq/L   Potassium 4.0  3.5 - 5.1 mEq/L   Chloride 94 (*) 96 - 112 mEq/L   CO2 24  19 - 32 mEq/L   Glucose, Bld 244 (*) 70 - 99 mg/dL   BUN 58 (*) 6 - 23 mg/dL   Creatinine, Ser 7.82 (*) 0.50 - 1.10 mg/dL   Calcium 95.6 (*) 8.4 - 10.5 mg/dL   Total Protein 8.2  6.0 - 8.3 g/dL   Albumin 3.9  3.5 - 5.2 g/dL   AST 13  0 - 37  U/L   ALT 12  0 - 35 U/L   Alkaline Phosphatase 115  39 - 117 U/L   Total Bilirubin 0.5  0.3 - 1.2 mg/dL   GFR calc non Af Amer 18 (*) >90 mL/min   GFR calc Af Amer 21 (*) >90 mL/min   Comment:            The eGFR has been calculated     using the CKD EPI equation.     This calculation has not been     validated in all clinical     situations.     eGFR's persistently     <90 mL/min signify     possible Chronic Kidney Disease.  LIPASE, BLOOD     Status:  Abnormal   Collection Time    03/04/13 10:45 PM      Result Value Range   Lipase 9 (*) 11 - 59 U/L  CG4 I-STAT (LACTIC ACID)     Status: None   Collection Time    03/04/13 11:11 PM      Result Value Range   Lactic Acid, Venous 2.14  0.5 - 2.2 mmol/L  GLUCOSE, CAPILLARY     Status: Abnormal   Collection Time    03/05/13  3:10 AM      Result Value Range   Glucose-Capillary 236 (*) 70 - 99 mg/dL   Comment 1 Notify RN    URINALYSIS, ROUTINE W REFLEX MICROSCOPIC     Status: Abnormal   Collection Time    03/05/13  3:32 AM      Result Value Range   Color, Urine YELLOW  YELLOW   APPearance CLOUDY (*) CLEAR   Specific Gravity, Urine 1.028  1.005 - 1.030   pH 5.0  5.0 - 8.0   Glucose, UA NEGATIVE  NEGATIVE mg/dL   Hgb urine dipstick TRACE (*) NEGATIVE   Bilirubin Urine MODERATE (*) NEGATIVE   Ketones, ur NEGATIVE  NEGATIVE mg/dL   Protein, ur 30 (*) NEGATIVE mg/dL   Urobilinogen, UA 0.2  0.0 - 1.0 mg/dL   Nitrite NEGATIVE  NEGATIVE   Leukocytes, UA NEGATIVE  NEGATIVE  URINE MICROSCOPIC-ADD ON     Status: Abnormal   Collection Time    03/05/13  3:32 AM      Result Value Range   Squamous Epithelial / LPF MANY (*) RARE   RBC / HPF 0-2  <3 RBC/hpf   Casts HYALINE CASTS (*) NEGATIVE   Urine-Other MUCOUS PRESENT     Comment: AMORPHOUS URATES/PHOSPHATES  MRSA PCR SCREENING     Status: Abnormal   Collection Time    03/05/13  3:45 AM      Result Value Range   MRSA by PCR POSITIVE (*) NEGATIVE   Comment:            The GeneXpert MRSA Assay (FDA     approved for NASAL specimens     only), is one component of a     comprehensive MRSA colonization     surveillance program. It is not     intended to diagnose MRSA     infection nor to guide or     monitor treatment for     MRSA infections.     RESULT CALLED TO, READ BACK BY AND VERIFIED WITH:     Jerold Coombe [id number] @ 0511 BY J SCOTTON  BASIC METABOLIC PANEL     Status: Abnormal   Collection Time    03/05/13  4:24 AM  Result Value Range   Sodium 136  135 - 145 mEq/L   Potassium 3.5  3.5 - 5.1 mEq/L   Chloride 100  96 - 112 mEq/L   CO2 21  19 - 32 mEq/L   Glucose, Bld 279 (*) 70 - 99 mg/dL   BUN 59 (*) 6 - 23 mg/dL   Creatinine, Ser 5.62 (*) 0.50 - 1.10 mg/dL   Calcium 13.0  8.4 - 86.5 mg/dL   GFR calc non Af Amer 20 (*) >90 mL/min   GFR calc Af Amer 23 (*) >90 mL/min   Comment:            The eGFR has been calculated     using the CKD EPI equation.     This calculation has not been     validated in all clinical     situations.     eGFR's persistently     <90 mL/min signify     possible Chronic Kidney Disease.  CBC     Status: Abnormal   Collection Time    03/05/13  4:24 AM      Result Value Range   WBC 5.8  4.0 - 10.5 K/uL   RBC 5.05  3.87 - 5.11 MIL/uL   Hemoglobin 14.0  12.0 - 15.0 g/dL   HCT 78.4  69.6 - 29.5 %   MCV 86.7  78.0 - 100.0 fL   MCH 27.7  26.0 - 34.0 pg   MCHC 32.0  30.0 - 36.0 g/dL   RDW 28.4  13.2 - 44.0 %   Platelets 145 (*) 150 - 400 K/uL  GLUCOSE, CAPILLARY     Status: Abnormal   Collection Time    03/05/13  7:21 AM      Result Value Range   Glucose-Capillary 329 (*) 70 - 99 mg/dL  GLUCOSE, CAPILLARY     Status: Abnormal   Collection Time    03/05/13 10:50 AM      Result Value Range   Glucose-Capillary 356 (*) 70 - 99 mg/dL  GLUCOSE, CAPILLARY     Status: Abnormal   Collection Time    03/05/13 12:38 PM      Result Value Range   Glucose-Capillary 243 (*) 70 - 99 mg/dL   US Renal Transplant W/doppler  03/05/2013   *RADIOLOGY REPORT*  Clinical Data:  Renal failure  ULTRASOUND OF RENAL TRANSPLANT  Technique: Ultrasound examination of the renal transplant was performed with gray-scale and color Doppler evaluation.  Comparison:  None.  Findings:  There is no perinephric fluid collection.  Doppler analysis demonstrates a low resistance pattern for the interlobar are renal artery branches.  Transplant kidney location:  Right lower quadrant  Transplant kidney  description:  Normal in size and parenchymal echogenicity. No evidence of mass or hydronephrosis.  Resistive indices are as follows:  Main renal artery:  0.81.  Right upper pole interlobar artery:  0.76.  Right lower pole interlobar artery:  0.79.  Color flow in the main renal artery at the hilum:  Present.  Color flow in the main renal vein at the hilum:  Present.  Bladder:  Decompressed.  Other findings:  None  IMPRESSION: Right lower quadrant renal transplant is within normal limits.  No evidence of hydronephrosis.  Resistive indices are described above.   Original Report Authenticated By: Jolaine Click, M.D.   Dg Chest Port 1 View  03/05/2013   *RADIOLOGY REPORT*  Clinical Data: Weakness.  Dehydration.  Cough.  Diabetic.  PORTABLE CHEST -  1 VIEW  Comparison: 01/05/2013  Findings: Numerous leads and wires project over the chest.  Midline trachea.  Normal heart size and mediastinal contours. No pleural effusion or pneumothorax.  Clear lungs.  IMPRESSION: Normal chest.   Original Report Authenticated By: Jeronimo Greaves, M.D.    Assessment:  1 Acute Kidney Injury due to hypovolemia of GI origin 2 ESRD s/p kidney transplant 3 Dehydration due to GI losses 4 Diarrhea of undiagnosed cause in immunocompromised host  Plan: 1 stool for enteric pathogens, c. Diff, norovirus, rotovirus 2 IVF hydration , K supp  Erendira Crabtree C 03/05/2013, 3:11 PM

## 2013-03-06 DIAGNOSIS — R7309 Other abnormal glucose: Secondary | ICD-10-CM

## 2013-03-06 LAB — BASIC METABOLIC PANEL
Calcium: 9.9 mg/dL (ref 8.4–10.5)
Creatinine, Ser: 1.15 mg/dL — ABNORMAL HIGH (ref 0.50–1.10)
GFR calc non Af Amer: 56 mL/min — ABNORMAL LOW (ref >90)
Glucose, Bld: 169 mg/dL — ABNORMAL HIGH (ref 70–99)
Sodium: 136 mEq/L (ref 135–145)

## 2013-03-06 LAB — GLUCOSE, CAPILLARY
Glucose-Capillary: 125 mg/dL — ABNORMAL HIGH (ref 70–99)
Glucose-Capillary: 134 mg/dL — ABNORMAL HIGH (ref 70–99)

## 2013-03-06 NOTE — Progress Notes (Signed)
Subjective: Feeling much better. Diarrhea gone, eating OK  Objective Filed Vitals:   03/05/13 1416 03/05/13 2143 03/06/13 0500 03/06/13 1422  BP: 112/45 144/68 157/67 128/63  Pulse: 77 72 71 73  Temp: 97.6 F (36.4 C) 97.4 F (36.3 C) 97.5 F (36.4 C) 97.9 F (36.6 C)  TempSrc: Oral Oral Oral Oral  Resp: 18 20 18 18   Height:      Weight:      SpO2: 98% 97% 96% 98%    CBC:  Recent Labs Lab 03/04/13 2245 03/05/13 0424  WBC 7.6 5.8  NEUTROABS 5.3  --   HGB 15.7* 14.0  HCT 47.3* 43.8  MCV 85.7 86.7  PLT 188 145*   Basic Metabolic Panel:  Recent Labs Lab 03/04/13 2245 03/05/13 0424 03/06/13 0945  NA 135 136 136  K 4.0 3.5 4.3  CL 94* 100 105  CO2 24 21 23   GLUCOSE 244* 279* 169*  BUN 58* 59* 26*  CREATININE 2.97* 2.69* 1.15*  CALCIUM 11.5* 10.3 9.9    Physical Exam:  Blood pressure 128/63, pulse 73, temperature 97.9 F (36.6 C), temperature source Oral, resp. rate 18, height 5\' 8"  (1.727 m), weight 105.008 kg (231 lb 8 oz), last menstrual period 11/19/2011, SpO2 98.00%.  General appearance: alert and cooperative  Head: Normocephalic, without obvious abnormality, atraumatic  Eyes: negative  Ears: normal TM's and external ear canals both ears  Resp: clear to auscultation bilaterally  Chest wall: no tenderness  Cardio: regular rate and rhythm, S1, S2 normal, no murmur, click, rub or gallop  GI: soft, non-tender; bowel sounds normal; no masses, no organomegaly  Extremities: chronic changes in pretibial area  Skin: chronic chgs pretib, failed rue AVG, multiple tatoos  Neurologic: Grossly normal  Impression/Plan:  1 Acute Kidney Injury due to hypovolemia of GI origin- resolved 2 ESRD s/p kidney transplant. Continue usual meds 3 Dehydration due to GI losses  4 Diarrhea- resolved  Rec- OK for discharge from renal standpoint. Will sign off    [name] Moselle  MD [phone] pgr    [phone] cell 03/06/2013, 5:31 PM

## 2013-03-06 NOTE — Progress Notes (Signed)
[facility] PROGRESS NOTE  Assessment/Plan: *Renal failure, acute and hypotension: - baseline Cr. 1.0, started on IV fluids creatinine improving. - b-met pending. - encourage to drink water.  Diabetes mellitus: - cont NPH and SSI.  H/O kidney transplant: - follow up with nephrologist.  Hypercalcemia: - resolved with IV fluids.  Hypothyroidism: - cont synthroid.  Hypertension: - Cont to hold Medications.   Code Status: full Family Communication: none  Disposition Plan: obsevration   Consultants:  none  Procedures:  Renal ultrasound  Antibiotics:  None  HPI/Subjective: Relates she feels  better  Objective: Filed Vitals:   03/05/13 0621 03/05/13 1416 03/05/13 2143 03/06/13 0500  BP: 122/51 112/45 144/68 157/67  Pulse: 81 77 72 71  Temp: 97.4 F (36.3 C) 97.6 F (36.4 C) 97.4 F (36.3 C) 97.5 F (36.4 C)  TempSrc: Oral Oral Oral Oral  Resp: 16 18 20 18   Height:      Weight:      SpO2: 95% 98% 97% 96%    Intake/Output Summary (Last 24 hours) at 03/06/13 0925 Last data filed at 03/06/13 5284  Gross per 24 hour  Intake   3200 ml  Output   3200 ml  Net      0 ml   Filed Weights   03/04/13 2139 03/05/13 0300  Weight: 108.863 kg (240 lb) 105.008 kg (231 lb 8 oz)    Exam:  General: Alert, awake, oriented x3, in no acute distress.  HEENT: No bruits, no goiter.  Heart: Regular rate and rhythm, without murmurs, rubs, gallops.  Lungs: Good air movement, clear to auscultation  Abdomen: Soft, nontender, nondistended, positive bowel sounds.     Data Reviewed: Basic Metabolic Panel:  Recent Labs Lab 03/04/13 2245 03/05/13 0424  NA 135 136  K 4.0 3.5  CL 94* 100  CO2 24 21  GLUCOSE 244* 279*  BUN 58* 59*  CREATININE 2.97* 2.69*  CALCIUM 11.5* 10.3   Liver Function Tests:  Recent Labs Lab 03/04/13 2245  AST 13  ALT 12  ALKPHOS 115  BILITOT 0.5  PROT 8.2  ALBUMIN 3.9    Recent Labs Lab 03/04/13 2245  LIPASE 9*   No  results found for this basename: AMMONIA,  in the last 168 hours CBC:  Recent Labs Lab 03/04/13 2245 03/05/13 0424  WBC 7.6 5.8  NEUTROABS 5.3  --   HGB 15.7* 14.0  HCT 47.3* 43.8  MCV 85.7 86.7  PLT 188 145*   Cardiac Enzymes: No results found for this basename: CKTOTAL, CKMB, CKMBINDEX, TROPONINI,  in the last 168 hours BNP (last 3 results) No results found for this basename: PROBNP,  in the last 8760 hours CBG:  Recent Labs Lab 03/05/13 1050 03/05/13 1238 03/05/13 1708 03/05/13 2146 03/06/13 0726  GLUCAP 356* 243* 147* 142* 103*    Recent Results (from the past 240 hour(s))  CULTURE, BLOOD (ROUTINE X 2)     Status: None   Collection Time    03/04/13 10:55 PM      Result Value Range Status   Specimen Description BLOOD LEFT HAND   Final   Special Requests BOTTLES DRAWN AEROBIC AND ANAEROBIC 5CC   Final   Culture  Setup Time 03/05/2013 13:23   Final   Culture     Final   Value:        BLOOD CULTURE RECEIVED NO GROWTH TO DATE CULTURE WILL BE HELD FOR 5 DAYS BEFORE ISSUING A FINAL NEGATIVE REPORT   Report Status PENDING  Incomplete  CULTURE, BLOOD (ROUTINE X 2)     Status: None   Collection Time    03/04/13 11:00 PM      Result Value Range Status   Specimen Description BLOOD RIGHT HAND   Final   Special Requests BOTTLES DRAWN AEROBIC AND ANAEROBIC 5CC   Final   Culture  Setup Time 03/05/2013 13:23   Final   Culture     Final   Value:        BLOOD CULTURE RECEIVED NO GROWTH TO DATE CULTURE WILL BE HELD FOR 5 DAYS BEFORE ISSUING A FINAL NEGATIVE REPORT   Report Status PENDING   Incomplete  MRSA PCR SCREENING     Status: Abnormal   Collection Time    03/05/13  3:45 AM      Result Value Range Status   MRSA by PCR POSITIVE (*) NEGATIVE Final   Comment:            The GeneXpert MRSA Assay (FDA     approved for NASAL specimens     only), is one component of a     comprehensive MRSA colonization     surveillance program. It is not     intended to diagnose MRSA      infection nor to guide or     monitor treatment for     MRSA infections.     RESULT CALLED TO, READ BACK BY AND VERIFIED WITH:     Jerold Coombe [id number] @ 0511 BY J SCOTTON     Studies: US Renal Transplant W/doppler  03/05/2013   *RADIOLOGY REPORT*  Clinical Data:  Renal failure  ULTRASOUND OF RENAL TRANSPLANT  Technique: Ultrasound examination of the renal transplant was performed with gray-scale and color Doppler evaluation.  Comparison:  None.  Findings:  There is no perinephric fluid collection.  Doppler analysis demonstrates a low resistance pattern for the interlobar are renal artery branches.  Transplant kidney location:  Right lower quadrant  Transplant kidney description:  Normal in size and parenchymal echogenicity. No evidence of mass or hydronephrosis.  Resistive indices are as follows:  Main renal artery:  0.81.  Right upper pole interlobar artery:  0.76.  Right lower pole interlobar artery:  0.79.  Color flow in the main renal artery at the hilum:  Present.  Color flow in the main renal vein at the hilum:  Present.  Bladder:  Decompressed.  Other findings:  None  IMPRESSION: Right lower quadrant renal transplant is within normal limits.  No evidence of hydronephrosis.  Resistive indices are described above.   Original Report Authenticated By: Jolaine Click, M.D.   Dg Chest Port 1 View  03/05/2013   *RADIOLOGY REPORT*  Clinical Data: Weakness.  Dehydration.  Cough.  Diabetic.  PORTABLE CHEST - 1 VIEW  Comparison: 01/05/2013  Findings: Numerous leads and wires project over the chest.  Midline trachea.  Normal heart size and mediastinal contours. No pleural effusion or pneumothorax.  Clear lungs.  IMPRESSION: Normal chest.   Original Report Authenticated By: Jeronimo Greaves, M.D.    Scheduled Meds: . sodium chloride   Intravenous Once  . Chlorhexidine Gluconate Cloth  6 each Topical Q0600  . docusate sodium  100 mg Oral BID  . enoxaparin (LOVENOX) injection  40 mg Subcutaneous Q24H  .  insulin aspart  0-5 Units Subcutaneous QHS  . insulin aspart  0-9 Units Subcutaneous TID WC  . insulin NPH  40 Units Subcutaneous BID AC  . levothyroxine  137 mcg Oral  QAC breakfast  . mupirocin ointment  1 application Nasal BID  . mycophenolate  500 mg Oral BID  . potassium chloride  40 mEq Oral BID  . predniSONE  5 mg Oral Daily  . senna  1 tablet Oral BID  . sodium chloride  3 mL Intravenous Q12H  . tacrolimus  1 mg Oral BID   Continuous Infusions: . sodium chloride 100 mL/hr at 03/06/13 0813     Marinda Elk  [facility] Pager [phone]. If 8PM-8AM, please contact night-coverage at [email or web address], password [facility] 03/06/2013, 9:25 AM  LOS: 2 days

## 2013-03-07 DIAGNOSIS — E871 Hypo-osmolality and hyponatremia: Secondary | ICD-10-CM

## 2013-03-07 DIAGNOSIS — N186 End stage renal disease: Secondary | ICD-10-CM

## 2013-03-07 LAB — BASIC METABOLIC PANEL
BUN: 17 mg/dL (ref 6–23)
CO2: 24 mEq/L (ref 19–32)
Calcium: 10.1 mg/dL (ref 8.4–10.5)
Creatinine, Ser: 1.16 mg/dL — ABNORMAL HIGH (ref 0.50–1.10)
Glucose, Bld: 150 mg/dL — ABNORMAL HIGH (ref 70–99)

## 2013-03-07 LAB — GLUCOSE, CAPILLARY
Glucose-Capillary: 144 mg/dL — ABNORMAL HIGH (ref 70–99)
Glucose-Capillary: 43 mg/dL — CL (ref 70–99)
Glucose-Capillary: 149 mg/dL — ABNORMAL HIGH (ref 70–99)

## 2013-03-07 MED ORDER — GLUCOSE 40 % PO GEL
ORAL | Status: AC
  Administered 2013-03-07: 37.5 g
  Filled 2013-03-07: qty 1

## 2013-03-07 MED ORDER — ENOXAPARIN SODIUM 60 MG/0.6ML ~~LOC~~ SOLN
50.0000 mg | SUBCUTANEOUS | Status: DC
  Filled 2013-03-07: qty 0.6

## 2013-03-07 MED ORDER — DEXTROSE 50 % IV SOLN
INTRAVENOUS | Status: AC
  Filled 2013-03-07: qty 50

## 2013-03-07 MED ORDER — DEXTROSE 50 % IV SOLN
50.0000 mL | Freq: Once | INTRAVENOUS | Status: AC | PRN
  Administered 2013-03-07: 50 mL via INTRAVENOUS

## 2013-03-07 MED ORDER — GLUCOSE 40 % PO GEL: 1.0000 | ORAL | Status: DC | PRN

## 2013-03-07 NOTE — Progress Notes (Signed)
At 0230, patient called out and "felt funny" like blood sugar was low. CBG=46 so PO glucose syrup given and snack with peanut butter, apple juice given. Recheck at 0253=33 but RN and patient unsure if they had waited long enough so recheck at 0300=43. IV dextrose given and recheck at 0315=144. Patient remained asymptomatic throughout except for feeling funny. No diaphoresis or clamminess which she usually has. Will continue to monitor. Ginny Forth

## 2013-03-07 NOTE — Discharge Summary (Signed)
Physician Discharge Summary  [name] ZOX:[id number] DOB: 1966-03-18 DOA: 03/04/2013  PCP: [name], MD  Admit date: 03/04/2013 Discharge date: 03/07/2013  Time spent: 35 minutes  Recommendations for Outpatient Follow-up:  1. Follow up with NEPHROLOGIST 2-4 week check a b-met Discharge Diagnoses:  Principal Problem:   Renal failure, acute Active Problems:   Hypothyroidism   H/O kidney transplant   Hypercalcemia   Diabetes mellitus   Discharge Condition: stable  Diet recommendation: renal diet  Filed Weights   03/04/13 2139 03/05/13 0300 03/07/13 0500  Weight: 108.863 kg (240 lb) 105.008 kg (231 lb 8 oz) 109.2 kg (240 lb 11.9 oz)    History of present illness:  47 y.o. female with prior h/o renal transplant in 2011 April, diabetes and hypertension, hypothyroidism, reports generalized weakness for over 5 days associated with decreased appetite, decreased urine output. Pt reports being dehydrated, and dizziness . No nausea or vomiting. Low grade temp on admission. On arrival she was found to be hypotensive , and orthostatic, in acute on chronic renal failure with a creatinine of 2.97. CXR does not show any active disease. Blood cultures are drawn on admission. She was started on IV fluids and her BP improved to 90/70's. She is currently asymptomatic. Renal consulted by EDP recommended US of the transplant kidney with dopplers and will see the patient in am. She will be admitted to hospitalist service for further management. She also reports occasional abdominal and leg cramps.      Hospital Course:  Renal failure, acute and hypotension:  - due decrease intravascular vol due GO losses. Started on admision on NS IV. - baseline Cr. 1.0, creatinine improved   Diabetes mellitus:  - cont NPH and SSI.   H/O kidney transplant:  - follow up with nephrologist.   Hypercalcemia:  - resolved with IV fluids.   Hypothyroidism:  - cont synthroid.   Hypertension:  -  held med in patient resumes as an outpatient.    Procedures:  None  Consultations:  nephrology  Discharge Exam: Filed Vitals:   03/06/13 0500 03/06/13 1422 03/06/13 2100 03/07/13 0500  BP: 157/67 128/63 154/73 154/73  Pulse: 71 73 70 78  Temp: 97.5 F (36.4 C) 97.9 F (36.6 C) 98.2 F (36.8 C) 97.3 F (36.3 C)  TempSrc: Oral Oral Oral Oral  Resp: 18 18 20 18   Height:      Weight:    109.2 kg (240 lb 11.9 oz)  SpO2: 96% 98% 99% 99%    General: A&O x3 Cardiovascular: RRR Respiratory: good air movement CTa B/L  Discharge Instructions  Discharge Orders   Future Orders Complete By Expires     Diet - low sodium heart healthy  As directed     Increase activity slowly  As directed         Medication List         amLODipine 5 MG tablet  Commonly known as:  NORVASC  Take 1 tablet (5 mg total) by mouth daily.     carvedilol 25 MG tablet  Commonly known as:  COREG  Take 2 tablets (50 mg total) by mouth 2 (two) times daily.     insulin aspart 100 UNIT/ML injection  Commonly known as:  novoLOG  Inject 0-9 Units into the skin 3 (three) times daily with meals. For blood sugar less than 70: eat or drink something sweet and recheck, 70 - 120: 0 units, 121 - 150: 1 unit, 151 - 200: 2 units, 201 -  250: 3 units, 251 - 300: 5 units, 301 - 350: 7 units, 351 - 400: 9 units & for blood sugar greater than 400: call MD.     insulin NPH 100 UNIT/ML injection  Commonly known as:  HUMULIN N,NOVOLIN N  Inject 40 Units into the skin 2 (two) times daily before a meal.     levothyroxine 137 MCG tablet  Commonly known as:  SYNTHROID, LEVOTHROID  Take 137 mcg by mouth daily. Name brand     mycophenolate 250 MG capsule  Commonly known as:  CELLCEPT  Take 500 mg by mouth 2 (two) times daily.     predniSONE 5 MG tablet  Commonly known as:  DELTASONE  Take 5 mg by mouth daily.     ranitidine 150 MG capsule  Commonly known as:  ZANTAC  Take 150 mg by mouth at bedtime.      tacrolimus 1 MG capsule  Commonly known as:  PROGRAF  Take 1 mg by mouth 2 (two) times daily.       Allergies  Allergen Reactions  . Ace Inhibitors Other (See Comments)    Cough   ( Lisinopril )  . Adhesive (Tape) Itching       Follow-up Information   Follow up with COLADONATO,JOSEPH A, MD In 2 weeks. (hospital follow up)    Contact information:   [address] [facility] [place] [address] [phone]        The results of significant diagnostics from this hospitalization (including imaging, microbiology, ancillary and laboratory) are listed below for reference.    Significant Diagnostic Studies: US Renal Transplant W/doppler  03/05/2013   *RADIOLOGY REPORT*  Clinical Data:  Renal failure  ULTRASOUND OF RENAL TRANSPLANT  Technique: Ultrasound examination of the renal transplant was performed with gray-scale and color Doppler evaluation.  Comparison:  None.  Findings:  There is no perinephric fluid collection.  Doppler analysis demonstrates a low resistance pattern for the interlobar are renal artery branches.  Transplant kidney location:  Right lower quadrant  Transplant kidney description:  Normal in size and parenchymal echogenicity. No evidence of mass or hydronephrosis.  Resistive indices are as follows:  Main renal artery:  0.81.  Right upper pole interlobar artery:  0.76.  Right lower pole interlobar artery:  0.79.  Color flow in the main renal artery at the hilum:  Present.  Color flow in the main renal vein at the hilum:  Present.  Bladder:  Decompressed.  Other findings:  None  IMPRESSION: Right lower quadrant renal transplant is within normal limits.  No evidence of hydronephrosis.  Resistive indices are described above.   Original Report Authenticated By: Jolaine Click, M.D.   Dg Chest Port 1 View  03/05/2013   *RADIOLOGY REPORT*  Clinical Data: Weakness.  Dehydration.  Cough.  Diabetic.  PORTABLE CHEST - 1 VIEW  Comparison: 01/05/2013  Findings: Numerous  leads and wires project over the chest.  Midline trachea.  Normal heart size and mediastinal contours. No pleural effusion or pneumothorax.  Clear lungs.  IMPRESSION: Normal chest.   Original Report Authenticated By: Jeronimo Greaves, M.D.    Microbiology: Recent Results (from the past 240 hour(s))  CULTURE, BLOOD (ROUTINE X 2)     Status: None   Collection Time    03/04/13 10:55 PM      Result Value Range Status   Specimen Description BLOOD LEFT HAND   Final   Special Requests BOTTLES DRAWN AEROBIC AND ANAEROBIC 5CC   Final  Culture  Setup Time 03/05/2013 13:23   Final   Culture     Final   Value:        BLOOD CULTURE RECEIVED NO GROWTH TO DATE CULTURE WILL BE HELD FOR 5 DAYS BEFORE ISSUING A FINAL NEGATIVE REPORT   Report Status PENDING   Incomplete  CULTURE, BLOOD (ROUTINE X 2)     Status: None   Collection Time    03/04/13 11:00 PM      Result Value Range Status   Specimen Description BLOOD RIGHT HAND   Final   Special Requests BOTTLES DRAWN AEROBIC AND ANAEROBIC 5CC   Final   Culture  Setup Time 03/05/2013 13:23   Final   Culture     Final   Value:        BLOOD CULTURE RECEIVED NO GROWTH TO DATE CULTURE WILL BE HELD FOR 5 DAYS BEFORE ISSUING A FINAL NEGATIVE REPORT   Report Status PENDING   Incomplete  MRSA PCR SCREENING     Status: Abnormal   Collection Time    03/05/13  3:45 AM      Result Value Range Status   MRSA by PCR POSITIVE (*) NEGATIVE Final   Comment:            The GeneXpert MRSA Assay (FDA     approved for NASAL specimens     only), is one component of a     comprehensive MRSA colonization     surveillance program. It is not     intended to diagnose MRSA     infection nor to guide or     monitor treatment for     MRSA infections.     RESULT CALLED TO, READ BACK BY AND VERIFIED WITH:     Jerold Coombe [id number] @ 0511 BY J SCOTTON     Labs: Basic Metabolic Panel:  Recent Labs Lab 03/04/13 2245 03/05/13 0424 03/06/13 0945 03/07/13 0415  NA 135 136 136  137  K 4.0 3.5 4.3 4.0  CL 94* 100 105 104  CO2 24 21 23 24   GLUCOSE 244* 279* 169* 150*  BUN 58* 59* 26* 17  CREATININE 2.97* 2.69* 1.15* 1.16*  CALCIUM 11.5* 10.3 9.9 10.1   Liver Function Tests:  Recent Labs Lab 03/04/13 2245  AST 13  ALT 12  ALKPHOS 115  BILITOT 0.5  PROT 8.2  ALBUMIN 3.9    Recent Labs Lab 03/04/13 2245  LIPASE 9*   No results found for this basename: AMMONIA,  in the last 168 hours CBC:  Recent Labs Lab 03/04/13 2245 03/05/13 0424  WBC 7.6 5.8  NEUTROABS 5.3  --   HGB 15.7* 14.0  HCT 47.3* 43.8  MCV 85.7 86.7  PLT 188 145*   Cardiac Enzymes: No results found for this basename: CKTOTAL, CKMB, CKMBINDEX, TROPONINI,  in the last 168 hours BNP: BNP (last 3 results) No results found for this basename: PROBNP,  in the last 8760 hours CBG:  Recent Labs Lab 03/07/13 0235 03/07/13 0253 03/07/13 0301 03/07/13 0318 03/07/13 0755  GLUCAP 46* 33* 43* 144* 149*     Signed:  Marinda Elk  [facility] 03/07/2013, 8:47 AM

## 2013-03-07 NOTE — Progress Notes (Signed)
Hypoglycemic Event  CBG: 46  Treatment: 1 tube instant glucose  Symptoms: None  Follow-up CBG: Time:0253 CBG Result:33  Possible Reasons for Event: Unknown  Comments/MD notified:See note    Bland Span C  Remember to initiate Hypoglycemia Order Set & complete

## 2013-03-07 NOTE — Progress Notes (Signed)
Hypoglycemic Event  CBG: 43  Treatment: D50 IV 50 mL  Symptoms: None  Follow-up CBG: Time:0318 CBG Result:144  Possible Reasons for Event: Unknown  Comments/MD notified:See note    [name]  Remember to initiate Hypoglycemia Order Set & complete

## 2013-03-07 NOTE — Progress Notes (Signed)
Pharmacy Note Lovenox  Lovenox 40mg  sq q24h ordered for VTE ppx. Pharmacy may adjust. Wt is 109.2kg, Ht 68 inches. BMI 37. Scr 1.2, CrCl 78 ml/min. ARF resolved. No bleeding reported. CBC 6/29 ok.  Will change to 50mg  sq q24h (0.5mg /kg).   Gwen Her PharmD  [phone] 03/07/2013 8:37 AM

## 2013-03-12 LAB — CULTURE, BLOOD (ROUTINE X 2)
Culture: NO GROWTH
Culture: NO GROWTH

## 2013-03-14 IMAGING — CR DG TIBIA/FIBULA 2V*R*
4 series · 4 of 4 positions shown · non-contrast
Comparison: None.

CLINICAL DATA: Nonhealing soft tissue wound.  Pain.

RIGHT TIBIA AND FIBULA - 2 VIEW

[t tib/fib ap right (1 of 2)]
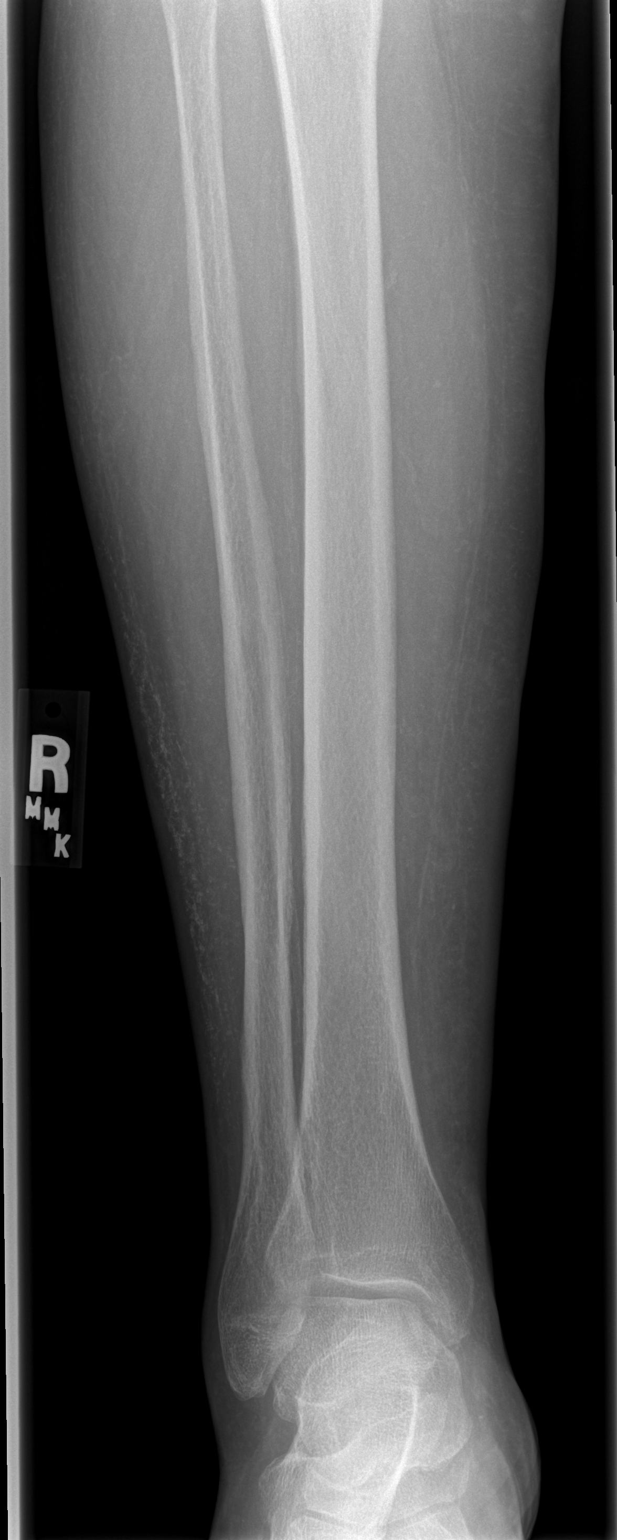

[t tib/fib ap right (2 of 2)]
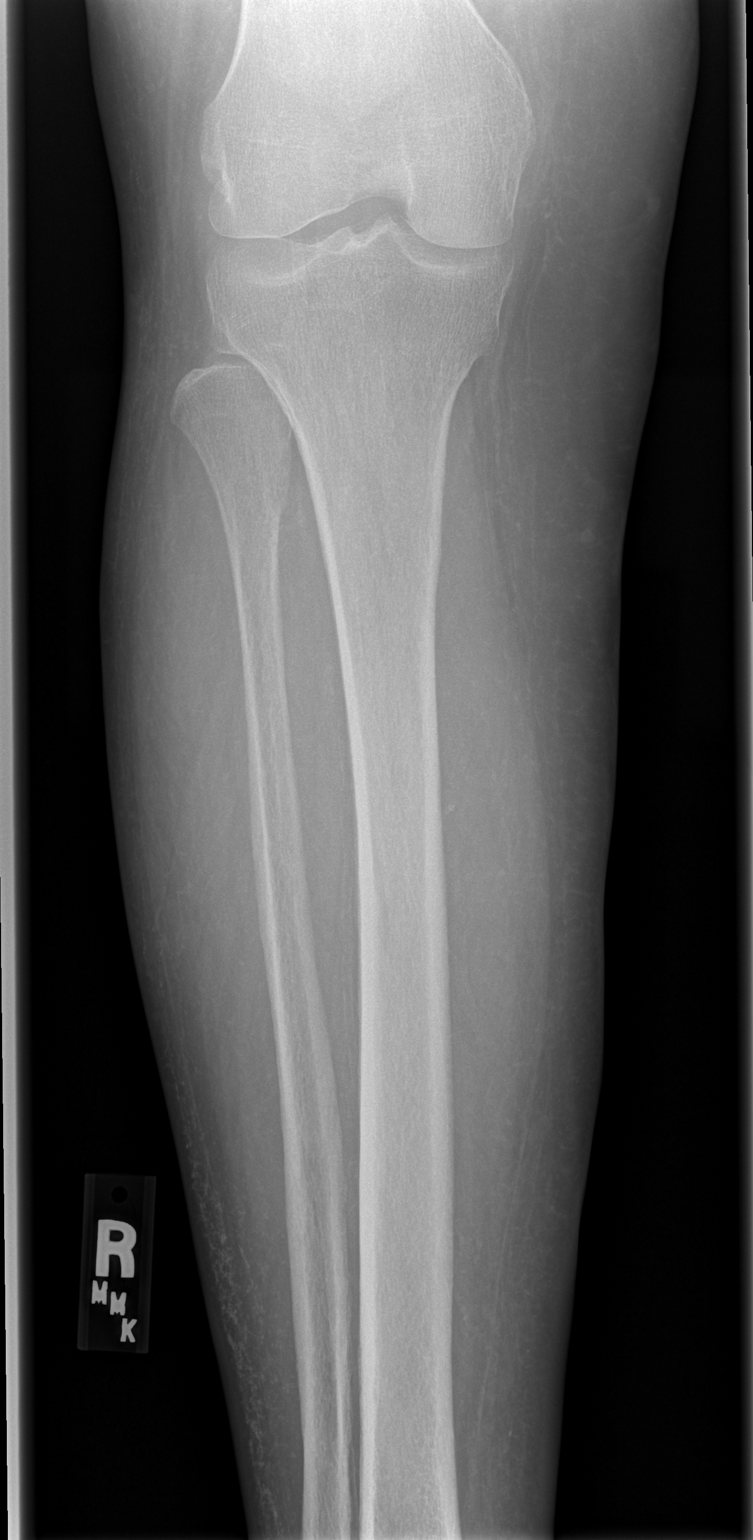

[t tib/fib lat right (1 of 2)]
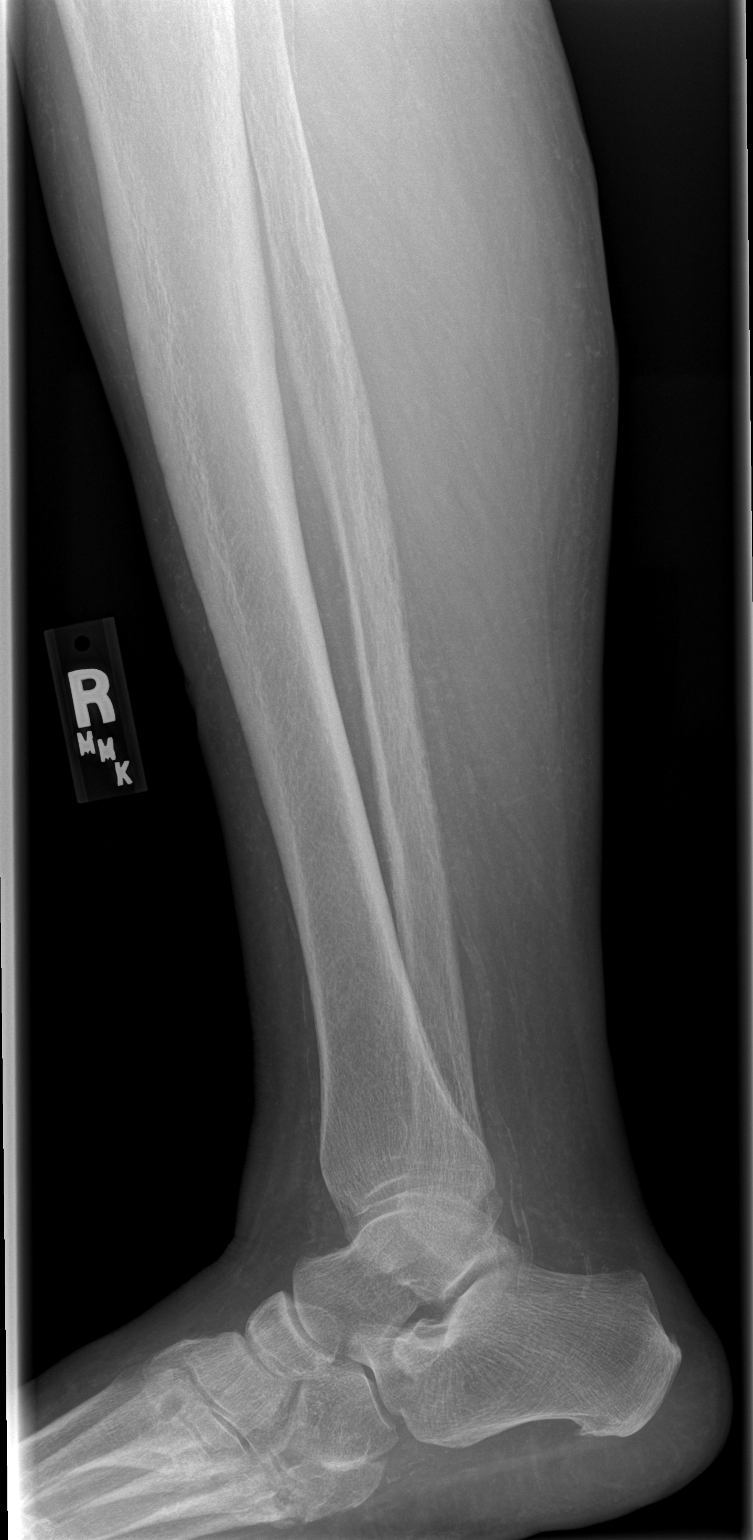

[t tib/fib lat right (2 of 2)]
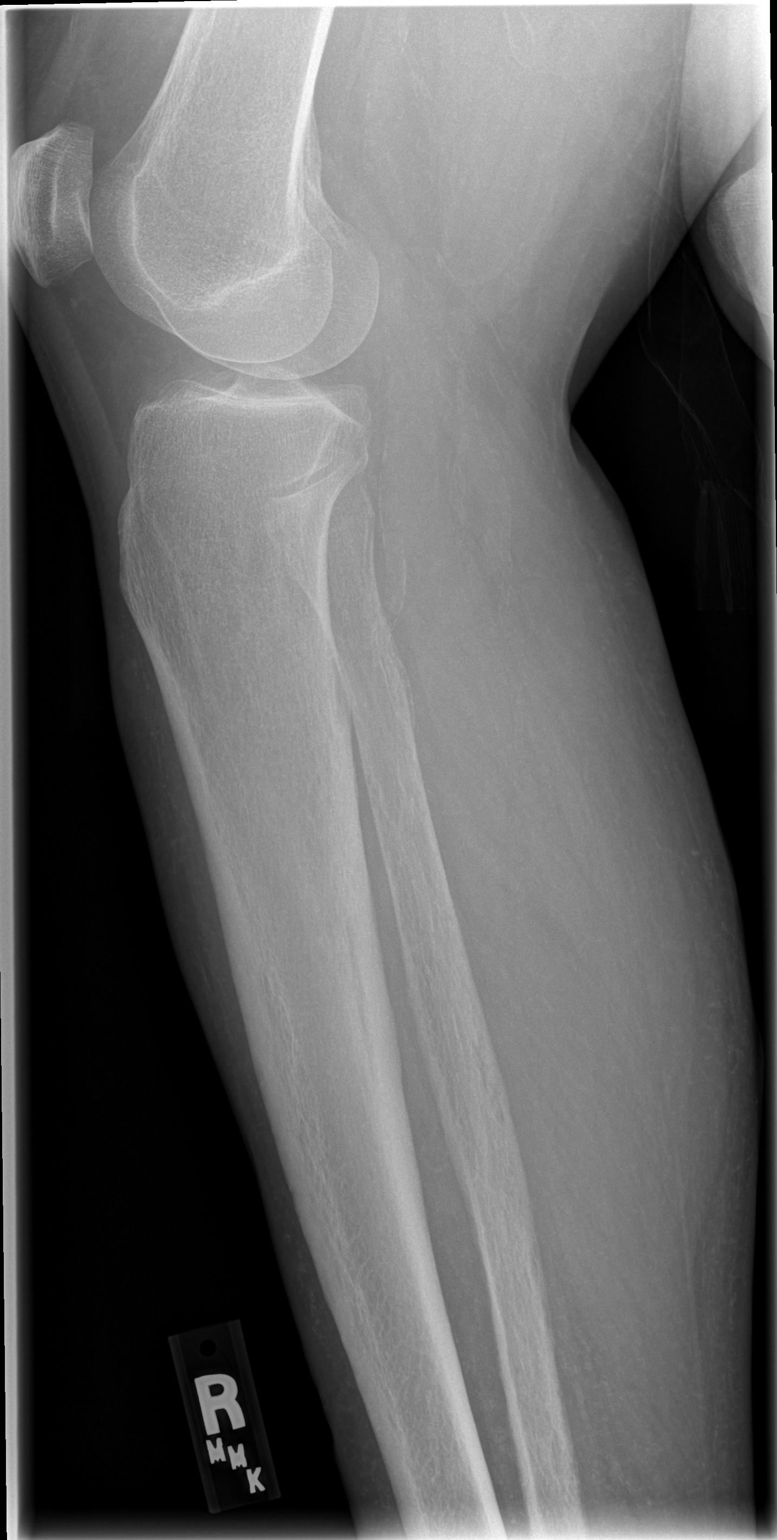

[4 of 4 positions shown; findings below may reference images not displayed]

FINDINGS: A soft tissue wound overlies the volar aspect of the
tibial mid shaft.  No underlying cortical erosion to suggest
osteomyelitis.
IMPRESSION: Soft tissue wound overlies the volar aspect of the tibial mid
shaft, without evidence of osteomyelitis.

## 2013-03-16 ENCOUNTER — Other Ambulatory Visit: Payer: Self-pay

## 2013-04-08 IMAGING — CR DG CHEST 2V
2 series · 2 of 2 positions shown · non-contrast
Comparison: 11/06/2009

CLINICAL DATA: Abdominal pain.  Chest tightness.  Congestion.
Cough.

CHEST - 2 VIEW

[w chest pa]
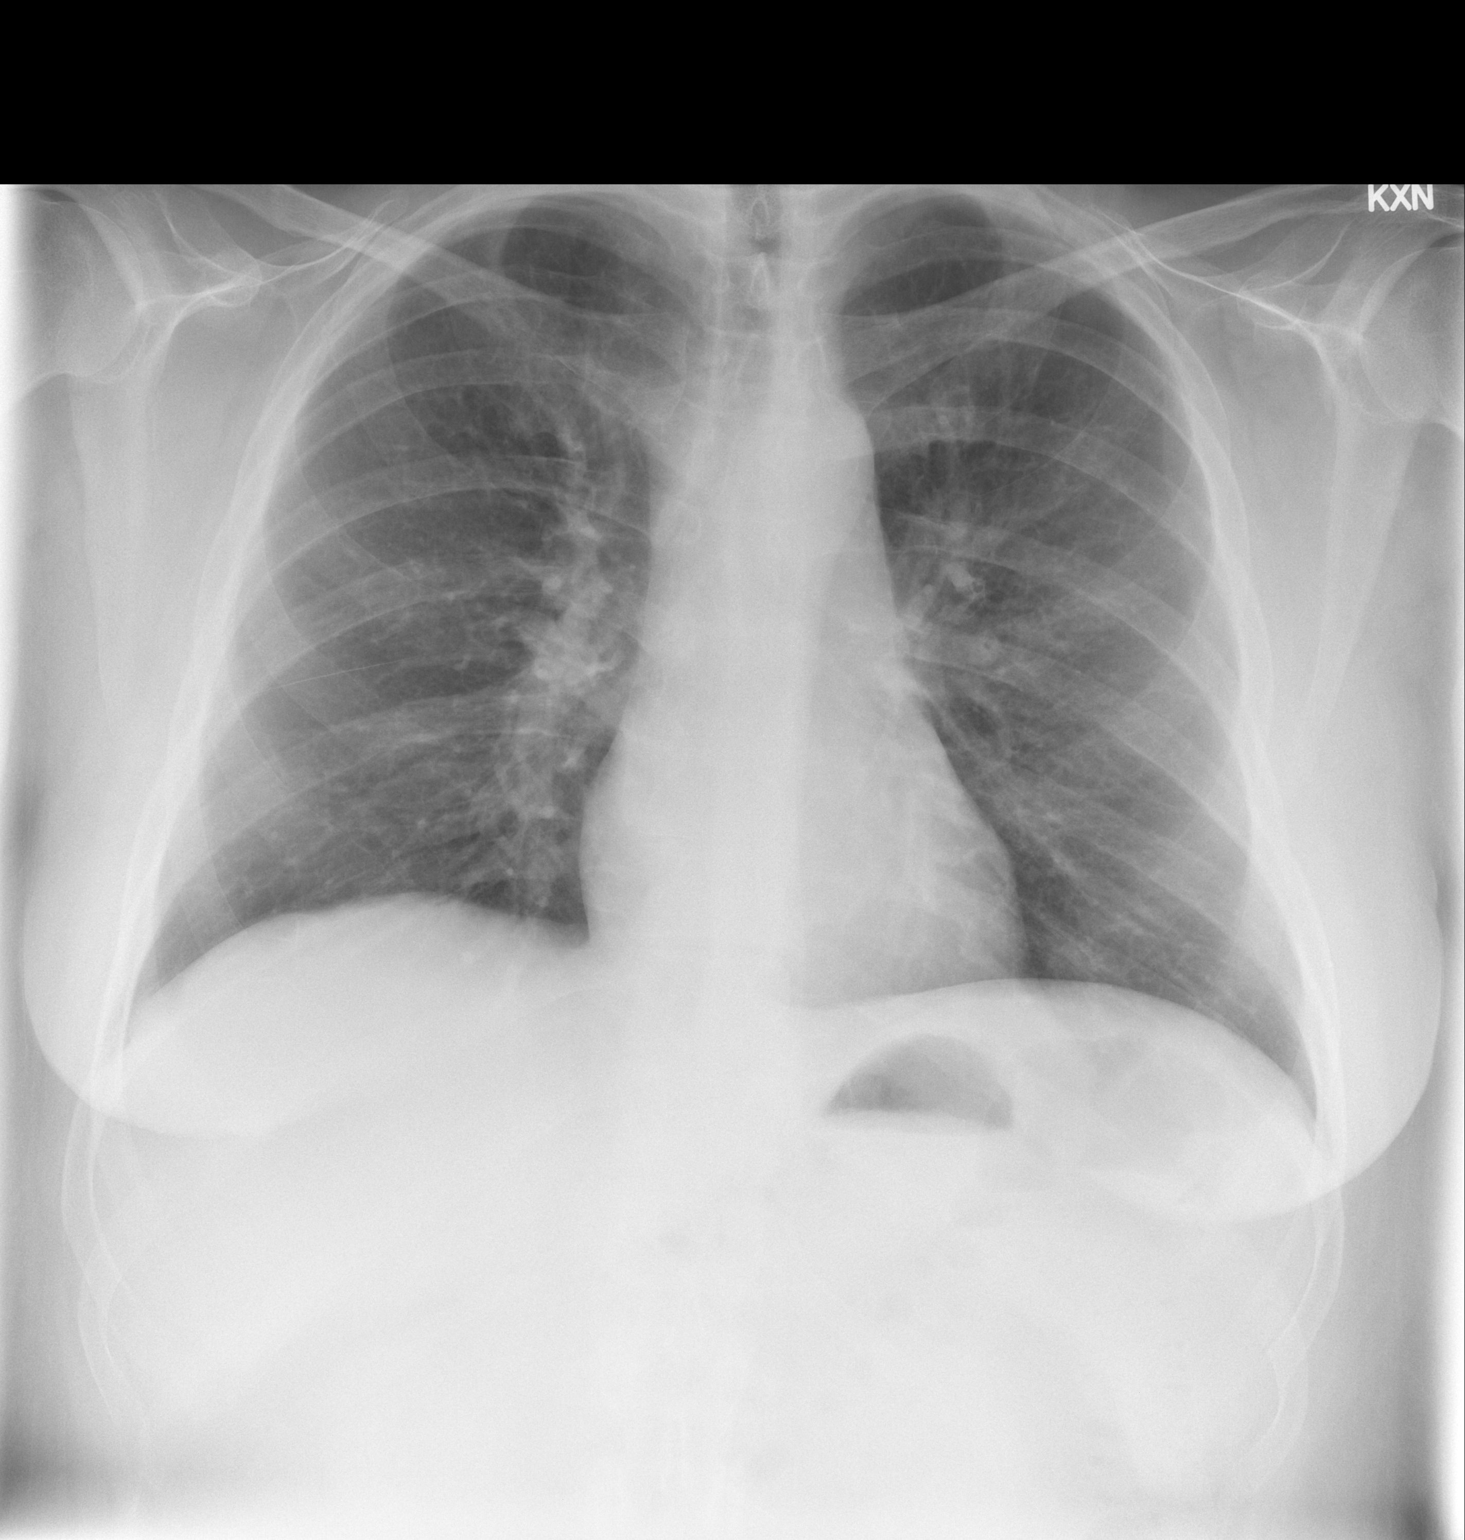

[w chest lat]
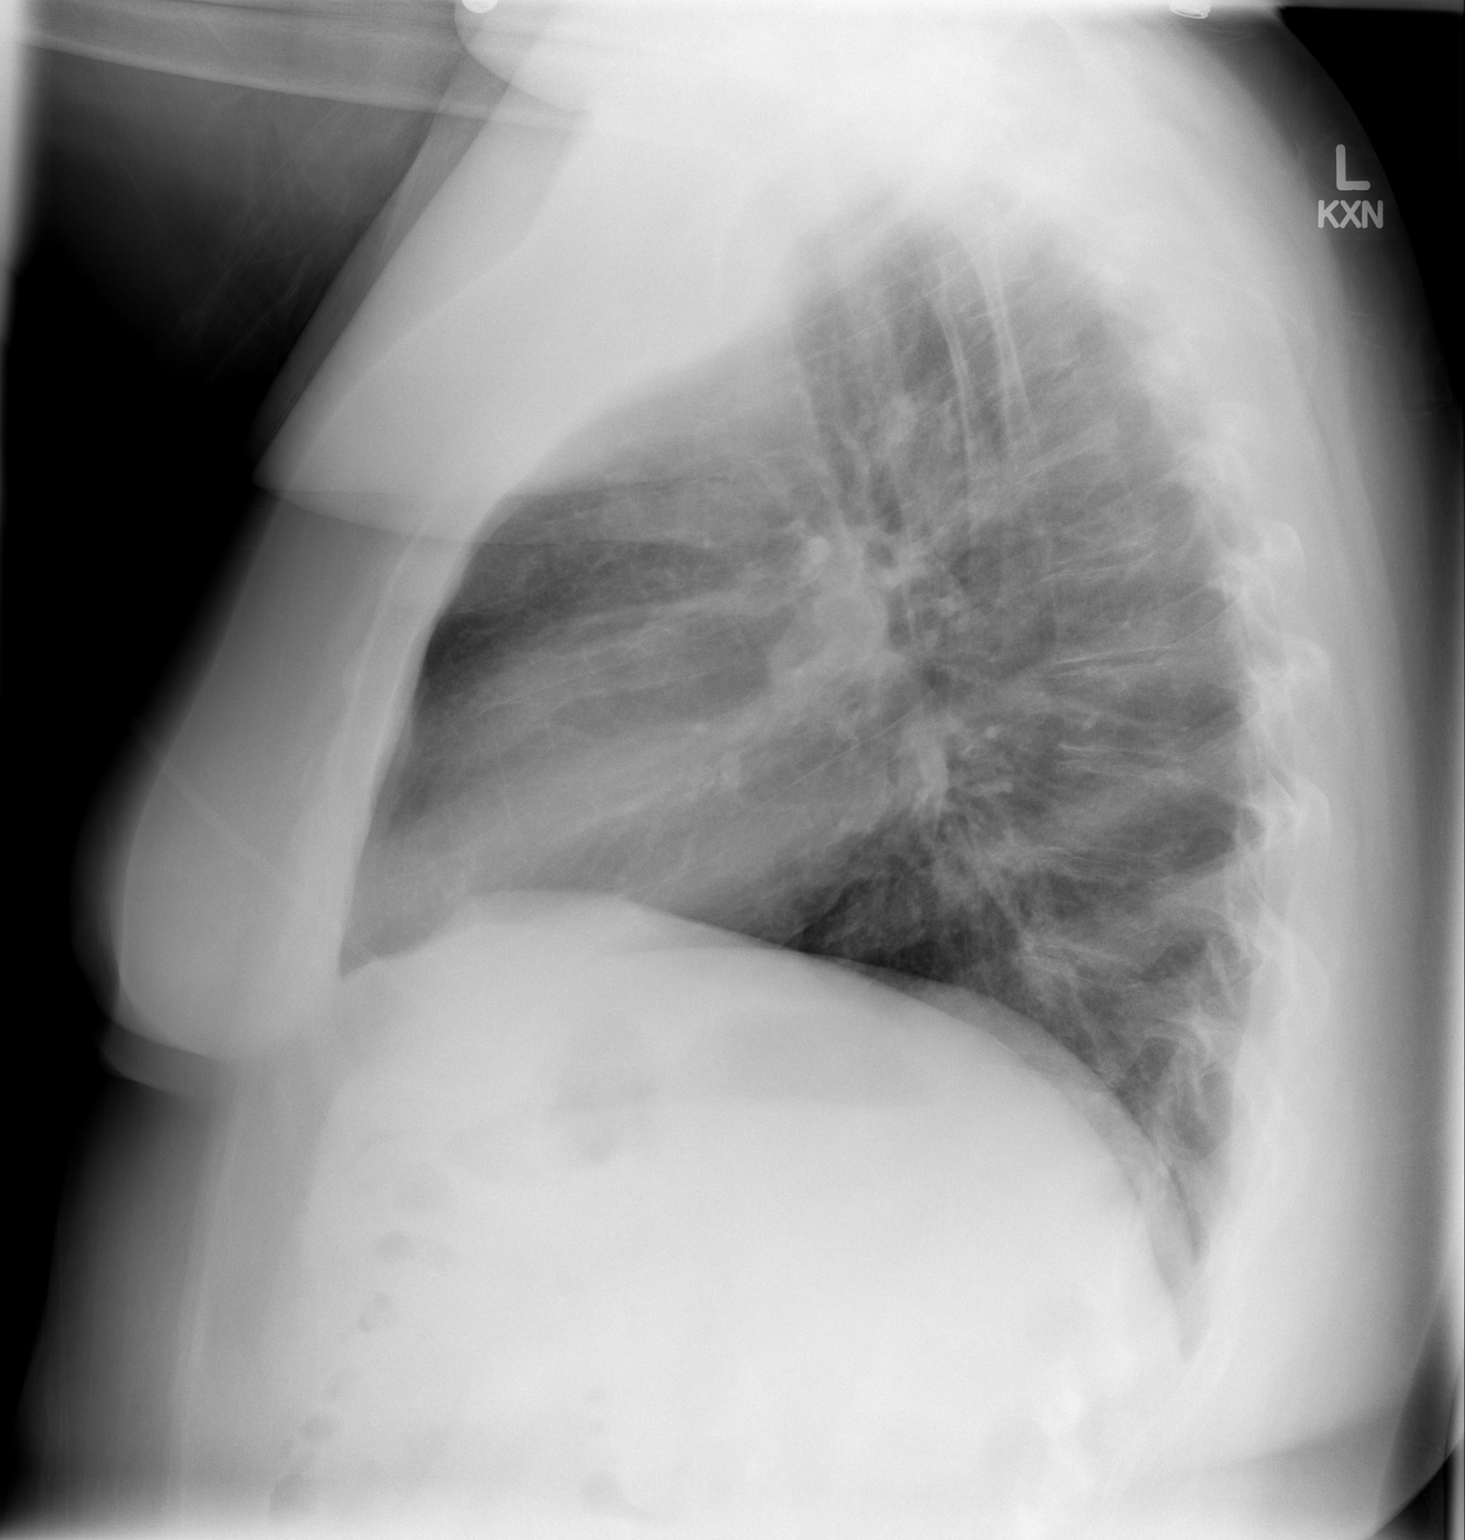

[2 of 2 positions shown; findings below may reference images not displayed]

FINDINGS: The heart size and pulmonary vascularity are normal. The
lungs appear clear and expanded without focal air space disease or
consolidation. No blunting of the costophrenic angles.  No
significant changes since the previous study.
IMPRESSION: No evidence of active pulmonary disease.

## 2013-06-05 ENCOUNTER — Encounter (HOSPITAL_COMMUNITY): Payer: Self-pay | Admitting: Emergency Medicine

## 2013-06-05 ENCOUNTER — Inpatient Hospital Stay (HOSPITAL_COMMUNITY)
Admission: EM | Admit: 2013-06-05 | Discharge: 2013-06-09 | DRG: 638 | Disposition: A | Discharge status: Home / Self Care | Payer: Medicare Other | Attending: Internal Medicine | Admitting: Internal Medicine

## 2013-06-05 DIAGNOSIS — Z79899 Other long term (current) drug therapy: Secondary | ICD-10-CM

## 2013-06-05 DIAGNOSIS — N179 Acute kidney failure, unspecified: Secondary | ICD-10-CM

## 2013-06-05 DIAGNOSIS — E869 Volume depletion, unspecified: Secondary | ICD-10-CM | POA: Diagnosis present

## 2013-06-05 DIAGNOSIS — E1010 Type 1 diabetes mellitus with ketoacidosis without coma: Secondary | ICD-10-CM

## 2013-06-05 DIAGNOSIS — D849 Immunodeficiency, unspecified: Secondary | ICD-10-CM

## 2013-06-05 DIAGNOSIS — Z87891 Personal history of nicotine dependence: Secondary | ICD-10-CM

## 2013-06-05 DIAGNOSIS — Z23 Encounter for immunization: Secondary | ICD-10-CM

## 2013-06-05 DIAGNOSIS — E039 Hypothyroidism, unspecified: Secondary | ICD-10-CM | POA: Diagnosis present

## 2013-06-05 DIAGNOSIS — E1065 Type 1 diabetes mellitus with hyperglycemia: Secondary | ICD-10-CM | POA: Diagnosis present

## 2013-06-05 DIAGNOSIS — E872 Acidosis: Secondary | ICD-10-CM

## 2013-06-05 DIAGNOSIS — Z598 Other problems related to housing and economic circumstances: Secondary | ICD-10-CM

## 2013-06-05 DIAGNOSIS — IMO0002 Reserved for concepts with insufficient information to code with codable children: Secondary | ICD-10-CM | POA: Diagnosis present

## 2013-06-05 DIAGNOSIS — F3289 Other specified depressive episodes: Secondary | ICD-10-CM | POA: Diagnosis present

## 2013-06-05 DIAGNOSIS — E8729 Other acidosis: Secondary | ICD-10-CM

## 2013-06-05 DIAGNOSIS — Z940 Kidney transplant status: Secondary | ICD-10-CM

## 2013-06-05 DIAGNOSIS — Z5987 Material hardship due to limited financial resources, not elsewhere classified: Secondary | ICD-10-CM

## 2013-06-05 DIAGNOSIS — E871 Hypo-osmolality and hyponatremia: Secondary | ICD-10-CM | POA: Diagnosis present

## 2013-06-05 DIAGNOSIS — E875 Hyperkalemia: Secondary | ICD-10-CM

## 2013-06-05 DIAGNOSIS — E860 Dehydration: Secondary | ICD-10-CM

## 2013-06-05 DIAGNOSIS — T82898A Other specified complication of vascular prosthetic devices, implants and grafts, initial encounter: Secondary | ICD-10-CM

## 2013-06-05 DIAGNOSIS — E2749 Other adrenocortical insufficiency: Secondary | ICD-10-CM | POA: Diagnosis present

## 2013-06-05 DIAGNOSIS — E119 Type 2 diabetes mellitus without complications: Secondary | ICD-10-CM

## 2013-06-05 DIAGNOSIS — F329 Major depressive disorder, single episode, unspecified: Secondary | ICD-10-CM | POA: Diagnosis present

## 2013-06-05 DIAGNOSIS — I10 Essential (primary) hypertension: Secondary | ICD-10-CM

## 2013-06-05 DIAGNOSIS — D72829 Elevated white blood cell count, unspecified: Secondary | ICD-10-CM

## 2013-06-05 DIAGNOSIS — A419 Sepsis, unspecified organism: Secondary | ICD-10-CM

## 2013-06-05 DIAGNOSIS — Z794 Long term (current) use of insulin: Secondary | ICD-10-CM

## 2013-06-05 DIAGNOSIS — N186 End stage renal disease: Secondary | ICD-10-CM

## 2013-06-05 DIAGNOSIS — E1110 Type 2 diabetes mellitus with ketoacidosis without coma: Secondary | ICD-10-CM | POA: Diagnosis present

## 2013-06-05 LAB — BASIC METABOLIC PANEL
CO2: 14 mEq/L — ABNORMAL LOW (ref 19–32)
Calcium: 11 mg/dL — ABNORMAL HIGH (ref 8.4–10.5)
Calcium: 10.3 mg/dL (ref 8.4–10.5)
Chloride: 92 mEq/L — ABNORMAL LOW (ref 96–112)
Creatinine, Ser: 1.41 mg/dL — ABNORMAL HIGH (ref 0.50–1.10)
GFR calc Af Amer: 51 mL/min — ABNORMAL LOW (ref >90)
GFR calc Af Amer: 52 mL/min — ABNORMAL LOW (ref >90)
Glucose, Bld: 442 mg/dL — ABNORMAL HIGH (ref 70–99)
Potassium: 4.2 mEq/L (ref 3.5–5.1)
Sodium: 127 mEq/L — ABNORMAL LOW (ref 135–145)
Sodium: 131 mEq/L — ABNORMAL LOW (ref 135–145)

## 2013-06-05 LAB — CBC
HCT: 41.6 % (ref 36.0–46.0)
MCH: 29.3 pg (ref 26.0–34.0)
MCH: 29.1 pg (ref 26.0–34.0)
MCV: 87.8 fL (ref 78.0–100.0)
MCV: 87.2 fL (ref 78.0–100.0)
Platelets: 176 10*3/uL (ref 150–400)
Platelets: 169 10*3/uL (ref 150–400)
RBC: 5.23 MIL/uL — ABNORMAL HIGH (ref 3.87–5.11)
RBC: 4.77 MIL/uL (ref 3.87–5.11)
RDW: 12.9 % (ref 11.5–15.5)
RDW: 12.9 % (ref 11.5–15.5)
WBC: 6.9 10*3/uL (ref 4.0–10.5)

## 2013-06-05 LAB — GLUCOSE, CAPILLARY
Glucose-Capillary: >600 mg/dL (ref 70–99)
Glucose-Capillary: 473 mg/dL — ABNORMAL HIGH (ref 70–99)

## 2013-06-05 LAB — URINALYSIS, ROUTINE W REFLEX MICROSCOPIC
Glucose, UA: >1000 mg/dL — AB
Hgb urine dipstick: NEGATIVE
Ketones, ur: 40 mg/dL — AB
Protein, ur: NEGATIVE mg/dL
Specific Gravity, Urine: 1.035 — ABNORMAL HIGH (ref 1.005–1.030)
Urobilinogen, UA: 0.2 mg/dL (ref 0.0–1.0)

## 2013-06-05 LAB — BLOOD GAS, VENOUS
Acid-base deficit: 12.1 mmol/L — ABNORMAL HIGH (ref 0.0–2.0)
Drawn by: 295031
FIO2: 0.21 %
O2 Saturation: 48.6 %
Patient temperature: 98.6
TCO2: 12.7 mmol/L (ref 0–100)
pO2, Ven: 0 mmHg — CL (ref 30.0–45.0)

## 2013-06-05 LAB — URINE MICROSCOPIC-ADD ON

## 2013-06-05 LAB — MRSA PCR SCREENING: MRSA by PCR: NEGATIVE

## 2013-06-05 MED ORDER — LEVOTHYROXINE SODIUM 137 MCG PO TABS
137.0000 ug | ORAL_TABLET | Freq: Every day | ORAL | Status: DC
  Administered 2013-06-06 – 2013-06-09 (×4): 137 ug via ORAL
  Filled 2013-06-05 (×5): qty 1

## 2013-06-05 MED ORDER — FAMOTIDINE 20 MG PO TABS
20.0000 mg | ORAL_TABLET | Freq: Every day | ORAL | Status: DC
  Administered 2013-06-05 – 2013-06-09 (×5): 20 mg via ORAL
  Filled 2013-06-05 (×5): qty 1

## 2013-06-05 MED ORDER — SODIUM CHLORIDE 0.9 % IV SOLN: INTRAVENOUS | Status: DC

## 2013-06-05 MED ORDER — TACROLIMUS 1 MG PO CAPS
1.0000 mg | ORAL_CAPSULE | Freq: Two times a day (BID) | ORAL | Status: DC
  Administered 2013-06-05 – 2013-06-09 (×8): 1 mg via ORAL
  Filled 2013-06-05 (×9): qty 1

## 2013-06-05 MED ORDER — SODIUM CHLORIDE 0.9 % IV SOLN
1.0000 g | Freq: Once | INTRAVENOUS | Status: DC
  Filled 2013-06-05: qty 10

## 2013-06-05 MED ORDER — ENOXAPARIN SODIUM 60 MG/0.6ML ~~LOC~~ SOLN
50.0000 mg | SUBCUTANEOUS | Status: DC
  Administered 2013-06-05 – 2013-06-08 (×4): 50 mg via SUBCUTANEOUS
  Filled 2013-06-05 (×5): qty 0.6

## 2013-06-05 MED ORDER — DEXTROSE 50 % IV SOLN: 25.0000 mL | INTRAVENOUS | Status: DC | PRN

## 2013-06-05 MED ORDER — MYCOPHENOLATE MOFETIL 250 MG PO CAPS
500.0000 mg | ORAL_CAPSULE | Freq: Two times a day (BID) | ORAL | Status: DC
  Administered 2013-06-05 – 2013-06-09 (×8): 500 mg via ORAL
  Filled 2013-06-05 (×9): qty 2

## 2013-06-05 MED ORDER — SODIUM CHLORIDE 0.9 % IV SOLN
INTRAVENOUS | Status: DC
  Administered 2013-06-05: 5.4 [IU]/h via INTRAVENOUS
  Filled 2013-06-05: qty 1

## 2013-06-05 MED ORDER — ADULT MULTIVITAMIN W/MINERALS CH
1.0000 | ORAL_TABLET | Freq: Every day | ORAL | Status: DC
  Administered 2013-06-06 – 2013-06-09 (×4): 1 via ORAL
  Filled 2013-06-05 (×4): qty 1

## 2013-06-05 MED ORDER — PREDNISONE 5 MG PO TABS
5.0000 mg | ORAL_TABLET | Freq: Every day | ORAL | Status: DC
  Administered 2013-06-06 – 2013-06-08 (×3): 5 mg via ORAL
  Filled 2013-06-05 (×5): qty 1

## 2013-06-05 MED ORDER — SODIUM CHLORIDE 0.9 % IV SOLN
INTRAVENOUS | Status: DC
  Administered 2013-06-05: 21:00:00 via INTRAVENOUS

## 2013-06-05 MED ORDER — SODIUM CHLORIDE 0.9 % IV BOLUS (SEPSIS)
1000.0000 mL | Freq: Once | INTRAVENOUS | Status: AC
  Administered 2013-06-05: 1000 mL via INTRAVENOUS

## 2013-06-05 MED ORDER — POTASSIUM CHLORIDE 10 MEQ/100ML IV SOLN
10.0000 meq | INTRAVENOUS | Status: AC
  Administered 2013-06-05 – 2013-06-06 (×4): 10 meq via INTRAVENOUS
  Filled 2013-06-05 (×3): qty 100

## 2013-06-05 MED ORDER — AMLODIPINE BESYLATE 5 MG PO TABS
5.0000 mg | ORAL_TABLET | Freq: Every day | ORAL | Status: DC
  Administered 2013-06-07 – 2013-06-09 (×3): 5 mg via ORAL
  Filled 2013-06-05 (×5): qty 1

## 2013-06-05 MED ORDER — CARVEDILOL 25 MG PO TABS
50.0000 mg | ORAL_TABLET | Freq: Two times a day (BID) | ORAL | Status: DC
  Administered 2013-06-05 – 2013-06-09 (×8): 50 mg via ORAL
  Filled 2013-06-05 (×9): qty 2

## 2013-06-05 MED ORDER — DEXTROSE-NACL 5-0.45 % IV SOLN
INTRAVENOUS | Status: DC
  Administered 2013-06-06 (×3): via INTRAVENOUS

## 2013-06-05 MED ORDER — POTASSIUM CHLORIDE 10 MEQ/100ML IV SOLN
INTRAVENOUS | Status: AC
  Filled 2013-06-05: qty 100

## 2013-06-05 NOTE — ED Notes (Signed)
Critical Lab value glucose 630.

## 2013-06-05 NOTE — H&P (Signed)
[facility] History and Physical  [name] ZOX:[id number] DOB: 1966-06-05 DOA: 06/05/2013  Referring physician: ER physician. PCP: [name], MD  Chief Complaint: Weakness and thirsty.  HPI: [name] is a 46 y.o. female history of diabetes mellitus, renal transplant, hypothyroidism presents with complaints of increased thirst and weakness. Patient states that due to financial issues patient has been trying to stretch her dose of insulin and was not taking as advised. In the ER patient was found to be having elevated blood sugar with anion gap. Patient has been admitted for DKA. Patient denies any nausea vomiting abdominal pain fever chills chest pain shortness of breath or any skin rash or lesions or any diarrhea.  Review of Systems: As presented in the history of presenting illness, rest negative.  Past Medical History  Diagnosis Date  . Hypertension   . Hypothyroid   . Depression   . 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 Kidney disease   . End stage renal disease     right arm AV graft, post transplant  . Diabetes mellitus     Insulin dependant   Past Surgical History  Procedure Laterality Date  . Back surgery  2001  . Tubal ligation    . Dg av dialysis graft declot or    . Nephrectomy transplanted organ    . Kidney transplant  December 16, 2009  . 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  . Cataract extraction w/ intraocular lens implant Right    Social History:  reports that she quit smoking about 14 years ago. Her smoking use included Cigarettes. She has a 11 pack-year smoking history. She has never used smokeless tobacco. She reports that she does not drink alcohol or use illicit drugs. Home. where does patient live-- Yes. Can patient participate in ADLs?  Allergies  Allergen Reactions  . Ace Inhibitors Other (See Comments)    Cough   ( Lisinopril )  . Adhesive [Tape] Itching    Family History  Problem Relation Age of  Onset  . Emphysema Mother   . Throat cancer Mother   . COPD Mother   . Cancer Mother   . Emphysema Father   . COPD Father   . Stroke Father   . ADD / ADHD Son    Prior to Admission medications   Medication Sig Start Date End Date Taking? Authorizing Provider  amLODipine (NORVASC) 5 MG tablet Take 1 tablet (5 mg total) by mouth daily. 08/08/12  Yes Alison Murray, MD  carvedilol (COREG) 25 MG tablet Take 2 tablets (50 mg total) by mouth 2 (two) times daily. 08/08/12  Yes Alison Murray, MD  insulin aspart (NOVOLOG) 100 UNIT/ML injection Inject 0-9 Units into the skin 3 (three) times daily with meals. For blood sugar less than 70: eat or drink something sweet and recheck, 70 - 120: 0 units, 121 - 150: 1 unit, 151 - 200: 2 units, 201 - 250: 3 units, 251 - 300: 5 units, 301 - 350: 7 units, 351 - 400: 9 units & for blood sugar greater than 400: call MD. 01/07/13  Yes Elease Etienne, MD  insulin NPH (HUMULIN N,NOVOLIN N) 100 UNIT/ML injection Inject 40 Units into the skin 2 (two) times daily before a meal.   Yes Historical Provider, MD  levothyroxine (SYNTHROID, LEVOTHROID) 137 MCG tablet Take 137 mcg by mouth daily. Name brand   Yes Historical Provider, MD  Multiple Vitamin (MULTIVITAMIN WITH MINERALS) TABS  tablet Take 1 tablet by mouth daily.   Yes Historical Provider, MD  mycophenolate (CELLCEPT) 250 MG capsule Take 500 mg by mouth 2 (two) times daily.    Yes Historical Provider, MD  predniSONE (DELTASONE) 5 MG tablet Take 5 mg by mouth daily.     Yes Historical Provider, MD  ranitidine (ZANTAC) 150 MG capsule Take 150 mg by mouth at bedtime.    Yes Historical Provider, MD  tacrolimus (PROGRAF) 1 MG capsule Take 1 mg by mouth 2 (two) times daily.    Yes Historical Provider, MD   Physical Exam: Filed Vitals:   06/05/13 1725 06/05/13 1801 06/05/13 2005  BP: 113/58 101/51   Pulse: 73 69   Resp: 16 18   Height: 5\' 10"  (1.778 m)  5\' 10"  (1.778 m)  Weight: 108.863 kg (240 lb)  100.7 kg (222 lb 0.1  oz)  SpO2: 100% 96%      General:  Well developed and nourished.  Eyes: anicteric and pallor.  ENT: No dischrage from ears eyes ears ornose.  Neck: No mass felt.  Cardiovascular: S1S2 heard.  Respiratory: No rhonchi no crepitations.  Abdomen: Soft nontender bowel sounds present.  Skin:  no rash.  Musculoskeletal:  no edema.  Psychiatric:  appears normal.  Neurologic:  alert awake oriented to time place and person. Moves all extremities.  Labs on Admission:  Basic Metabolic Panel:  Recent Labs Lab 06/05/13 1745  NA 127*  K 5.7*  CL 85*  CO2 13*  GLUCOSE 630*  BUN 43*  CREATININE 1.41*  CALCIUM 11.0*   Liver Function Tests: No results found for this basename: AST, ALT, ALKPHOS, BILITOT, PROT, ALBUMIN,  in the last 168 hours No results found for this basename: LIPASE, AMYLASE,  in the last 168 hours No results found for this basename: AMMONIA,  in the last 168 hours CBC:  Recent Labs Lab 06/05/13 1745  WBC 6.9  HGB 15.3*  HCT 45.9  MCV 87.8  PLT 176   Cardiac Enzymes: No results found for this basename: CKTOTAL, CKMB, CKMBINDEX, TROPONINI,  in the last 168 hours  BNP (last 3 results) No results found for this basename: PROBNP,  in the last 8760 hours CBG:  Recent Labs Lab 06/05/13 1735 06/05/13 1855 06/05/13 2031  GLUCAP 555* >600* 473*    Radiological Exams on Admission: No results found.  EKG: Independently reviewed.  normal sinus rhythm with RBBB and ST-T changes comparable to old EKG.  Assessment/Plan Principal Problem:   DKA (diabetic ketoacidoses) Active Problems:   Hypothyroidism   Hyponatremia   H/O kidney transplant   1. Diabetic ketoacidosis - probably precipitated by patient not taking her dose of insulin as advised due to financial reasons. At this time we will continue IV insulin and fluids and change to home dose insulin once anion gap is corrected. Check hemoglobin A1c. 2. Hyponatremia and hyperkalemia - probably from  DKA which I think we will get corrected with correction of DKA. 3. Renal transplant - continue immunosuppressants. Closely follow metabolic panel. 4. Hypothyroidism - continue Synthroid. Check TSH.    Code Status:  full code.  Family Communication:  none.  Disposition Plan:  admit to inpatient.    KAKRAKANDY,ARSHAD N. [facility] Pager [phone].  If 7PM-7AM, please contact night-coverage [email or web address] Password [facility] 06/05/2013, 8:33 PM

## 2013-06-05 NOTE — ED Notes (Signed)
Bed: WA05 Expected date:  Expected time:  Means of arrival:  Comments: EMS hyperglycemia 

## 2013-06-05 NOTE — ED Provider Notes (Signed)
CSN: [id number]     Arrival date & time 06/05/13  1716 History   First MD Initiated Contact with Patient 06/05/13 1735     Chief Complaint  Patient presents with  . Hyperglycemia   (Consider location/radiation/quality/duration/timing/severity/associated sxs/prior Treatment) Patient is a 47 y.o. female presenting with hyperglycemia. The history is provided by the patient.  Hyperglycemia Blood sugar level PTA:  550s Severity:  Moderate Onset quality:  Gradual Timing:  Constant Progression:  Worsening Chronicity:  New Diabetes status:  Controlled with insulin Current diabetic therapy:  70/30 insulin, 40 U am, 30 U pm Context: change in medication (decreasing insulin ussage)   Relieved by:  Nothing Ineffective treatments:  None tried Associated symptoms: no abdominal pain, no confusion, no dehydration, no fever, no shortness of breath and no vomiting     Past Medical History  Diagnosis Date  . Hypertension   . Hypothyroid   . Depression   . 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 Kidney disease   . End stage renal disease     right arm AV graft, post transplant  . Diabetes mellitus     Insulin dependant   Past Surgical History  Procedure Laterality Date  . Back surgery  2001  . Tubal ligation    . Dg av dialysis graft declot or    . Nephrectomy transplanted organ    . Kidney transplant  December 16, 2009  . 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  . Cataract extraction w/ intraocular lens implant Right    Family History  Problem Relation Age of Onset  . Emphysema Mother   . Throat cancer Mother   . COPD Mother   . Cancer Mother   . Emphysema Father   . COPD Father   . Stroke Father   . ADD / ADHD Son    History  Substance Use Topics  . Smoking status: Former Smoker -- 1.00 packs/day for 11 years    Types: Cigarettes    Quit date: 09/21/1998  . Smokeless tobacco: Never Used  . Alcohol Use: No   OB History   Grav Para Term Preterm Abortions TAB  SAB Ect Mult Living                 Review of Systems  Constitutional: Negative for fever.  Respiratory: Negative for cough and shortness of breath.   Gastrointestinal: Negative for vomiting and abdominal pain.  Psychiatric/Behavioral: Negative for confusion.  All other systems reviewed and are negative.    Allergies  Ace inhibitors and Adhesive  Home Medications   Current Outpatient Rx  Name  Route  Sig  Dispense  Refill  . amLODipine (NORVASC) 5 MG tablet   Oral   Take 1 tablet (5 mg total) by mouth daily.   30 tablet   0   . carvedilol (COREG) 25 MG tablet   Oral   Take 2 tablets (50 mg total) by mouth 2 (two) times daily.   60 tablet   0   . insulin aspart (NOVOLOG) 100 UNIT/ML injection   Subcutaneous   Inject 0-9 Units into the skin 3 (three) times daily with meals. For blood sugar less than 70: eat or drink something sweet and recheck, 70 - 120: 0 units, 121 - 150: 1 unit, 151 - 200: 2 units, 201 - 250: 3 units, 251 - 300: 5 units, 301 - 350: 7 units, 351 - 400: 9 units & for blood sugar greater than 400:  call MD.   1 vial   0   . insulin NPH (HUMULIN N,NOVOLIN N) 100 UNIT/ML injection   Subcutaneous   Inject 40 Units into the skin 2 (two) times daily before a meal.         . levothyroxine (SYNTHROID, LEVOTHROID) 137 MCG tablet   Oral   Take 137 mcg by mouth daily. Name brand         . Multiple Vitamin (MULTIVITAMIN WITH MINERALS) TABS tablet   Oral   Take 1 tablet by mouth daily.         . mycophenolate (CELLCEPT) 250 MG capsule   Oral   Take 500 mg by mouth 2 (two) times daily.          . predniSONE (DELTASONE) 5 MG tablet   Oral   Take 5 mg by mouth daily.           . ranitidine (ZANTAC) 150 MG capsule   Oral   Take 150 mg by mouth at bedtime.          . tacrolimus (PROGRAF) 1 MG capsule   Oral   Take 1 mg by mouth 2 (two) times daily.           BP 101/51  Pulse 69  Resp 18  Ht 5\' 10"  (1.778 m)  Wt 240 lb (108.863 kg)   BMI 34.44 kg/m2  SpO2 96%  LMP 11/19/2011 Physical Exam  Nursing note and vitals reviewed. Constitutional: She is oriented to person, place, and time. She appears well-developed and well-nourished. No distress.  HENT:  Head: Normocephalic and atraumatic.  Eyes: EOM are normal. Pupils are equal, round, and reactive to light.  Neck: Normal range of motion. Neck supple.  Cardiovascular: Normal rate and regular rhythm.  Exam reveals no friction rub.   No murmur heard. Pulmonary/Chest: Effort normal and breath sounds normal. No respiratory distress. She has no wheezes. She has no rales.  Abdominal: Soft. She exhibits no distension. There is no tenderness. There is no rebound.  Musculoskeletal: Normal range of motion. She exhibits no edema.  Neurological: She is alert and oriented to person, place, and time.  Skin: She is not diaphoretic.    ED Course  Procedures (including critical care time) Labs Review Labs Reviewed  CBC - Abnormal; Notable for the following:    RBC 5.23 (*)    Hemoglobin 15.3 (*)    All other components within normal limits  BLOOD GAS, VENOUS - Abnormal; Notable for the following:    pCO2, Ven 32.9 (*)    pO2, Ven 0.0 (*)    Bicarbonate 13.9 (*)    Acid-base deficit 12.1 (*)    All other components within normal limits  GLUCOSE, CAPILLARY - Abnormal; Notable for the following:    Glucose-Capillary 555 (*)    All other components within normal limits  BASIC METABOLIC PANEL  URINALYSIS, ROUTINE W REFLEX MICROSCOPIC  CG4 I-STAT (LACTIC ACID)   Imaging Review No results found.   Date: 06/05/2013  Rate: 70  Rhythm: normal sinus rhythm  QRS Axis: normal  Intervals: normal  ST/T Wave abnormalities: T wave inversions laterally  Conduction Disutrbances:right bundle branch block and left posterior fascicular block  Narrative Interpretation:   Old EKG Reviewed: unchanged  CRITICAL CARE Performed by: Dagmar Hait   Total critical care time: 30  minutes  Critical care time was exclusive of separately billable procedures and treating other patients.  Critical care was necessary to treat or prevent imminent  or life-threatening deterioration.  Critical care was time spent personally by me on the following activities: development of treatment plan with patient and/or surrogate as well as nursing, discussions with consultants, evaluation of patient's response to treatment, examination of patient, obtaining history from patient or surrogate, ordering and performing treatments and interventions, ordering and review of laboratory studies, ordering and review of radiographic studies, pulse oximetry and re-evaluation of patient's condition.    MDM   1. DKA (diabetic ketoacidoses)   2. H/O kidney transplant   3. Hyponatremia   4. Hypothyroidism   5. DKA, type 1   6. High anion gap metabolic acidosis   7. Dehydration with hyponatremia   8. Diabetes mellitus    44F presents with hyperglycemia. Has been spacing out her insulin due to financial reasons, hasn't been taking her NPH. Denies fevers, vomiting, diarrhea, abdominal pain. AFVSS here. Exam benign. Initial VBG with acidosis, pH of 7.25. Bicarb on VBG calculated to 14.  Will start insulin drip for DKA. Patient admitted to stepdown unit.  Dagmar Hait, MD 06/08/13 [phone]

## 2013-06-05 NOTE — ED Notes (Signed)
Per ems, pt took BS, stated "High". Ems BS read 579. Vitals stable. History of kidney transplant. Per pt, hasn't been able to eat the last few days. Sleepy all day, achy all across back of shoulders, Dizziness, dry mouth. Denies N/V/D.

## 2013-06-06 DIAGNOSIS — E1010 Type 1 diabetes mellitus with ketoacidosis without coma: Principal | ICD-10-CM

## 2013-06-06 DIAGNOSIS — E872 Acidosis: Secondary | ICD-10-CM

## 2013-06-06 LAB — BASIC METABOLIC PANEL
BUN: 20 mg/dL (ref 6–23)
BUN: 24 mg/dL — ABNORMAL HIGH (ref 6–23)
BUN: 29 mg/dL — ABNORMAL HIGH (ref 6–23)
BUN: 36 mg/dL — ABNORMAL HIGH (ref 6–23)
BUN: 40 mg/dL — ABNORMAL HIGH (ref 6–23)
BUN: 43 mg/dL — ABNORMAL HIGH (ref 6–23)
BUN: 43 mg/dL — ABNORMAL HIGH (ref 6–23)
CO2: 14 mEq/L — ABNORMAL LOW (ref 19–32)
CO2: 18 mEq/L — ABNORMAL LOW (ref 19–32)
CO2: 18 mEq/L — ABNORMAL LOW (ref 19–32)
CO2: 19 mEq/L (ref 19–32)
CO2: 20 mEq/L (ref 19–32)
CO2: 21 mEq/L (ref 19–32)
CO2: 21 mEq/L (ref 19–32)
CO2: 21 mEq/L (ref 19–32)
Calcium: 10.4 mg/dL (ref 8.4–10.5)
Calcium: 10.1 mg/dL (ref 8.4–10.5)
Calcium: 9.8 mg/dL (ref 8.4–10.5)
Calcium: 9.8 mg/dL (ref 8.4–10.5)
Chloride: 100 mEq/L (ref 96–112)
Chloride: 100 mEq/L (ref 96–112)
Chloride: 102 mEq/L (ref 96–112)
Chloride: 102 mEq/L (ref 96–112)
Chloride: 95 mEq/L — ABNORMAL LOW (ref 96–112)
Chloride: 99 mEq/L (ref 96–112)
Chloride: 99 mEq/L (ref 96–112)
Chloride: 99 mEq/L (ref 96–112)
Creatinine, Ser: 0.92 mg/dL (ref 0.50–1.10)
Creatinine, Ser: 0.99 mg/dL (ref 0.50–1.10)
Creatinine, Ser: 1.03 mg/dL (ref 0.50–1.10)
Creatinine, Ser: 1.04 mg/dL (ref 0.50–1.10)
Creatinine, Ser: 1.1 mg/dL (ref 0.50–1.10)
Creatinine, Ser: 1.23 mg/dL — ABNORMAL HIGH (ref 0.50–1.10)
Creatinine, Ser: 1.3 mg/dL — ABNORMAL HIGH (ref 0.50–1.10)
GFR calc Af Amer: 69 mL/min — ABNORMAL LOW (ref >90)
GFR calc Af Amer: 73 mL/min — ABNORMAL LOW (ref >90)
GFR calc Af Amer: 85 mL/min — ABNORMAL LOW (ref >90)
GFR calc Af Amer: 74 mL/min — ABNORMAL LOW (ref >90)
GFR calc non Af Amer: 52 mL/min — ABNORMAL LOW (ref >90)
GFR calc non Af Amer: 59 mL/min — ABNORMAL LOW (ref >90)
GFR calc non Af Amer: 63 mL/min — ABNORMAL LOW (ref >90)
GFR calc non Af Amer: 67 mL/min — ABNORMAL LOW (ref >90)
GFR calc non Af Amer: 74 mL/min — ABNORMAL LOW (ref >90)
Glucose, Bld: 136 mg/dL — ABNORMAL HIGH (ref 70–99)
Glucose, Bld: 136 mg/dL — ABNORMAL HIGH (ref 70–99)
Glucose, Bld: 144 mg/dL — ABNORMAL HIGH (ref 70–99)
Glucose, Bld: 186 mg/dL — ABNORMAL HIGH (ref 70–99)
Glucose, Bld: 214 mg/dL — ABNORMAL HIGH (ref 70–99)
Glucose, Bld: 234 mg/dL — ABNORMAL HIGH (ref 70–99)
Glucose, Bld: 294 mg/dL — ABNORMAL HIGH (ref 70–99)
Potassium: 4.2 mEq/L (ref 3.5–5.1)
Potassium: 4.4 mEq/L (ref 3.5–5.1)
Potassium: 4 mEq/L (ref 3.5–5.1)
Potassium: 4.1 mEq/L (ref 3.5–5.1)
Potassium: 4.1 mEq/L (ref 3.5–5.1)
Potassium: 3.7 mEq/L (ref 3.5–5.1)
Sodium: 134 mEq/L — ABNORMAL LOW (ref 135–145)
Sodium: 132 mEq/L — ABNORMAL LOW (ref 135–145)
Sodium: 133 mEq/L — ABNORMAL LOW (ref 135–145)
Sodium: 131 mEq/L — ABNORMAL LOW (ref 135–145)
Sodium: 132 mEq/L — ABNORMAL LOW (ref 135–145)

## 2013-06-06 LAB — GLUCOSE, CAPILLARY
Glucose-Capillary: 147 mg/dL — ABNORMAL HIGH (ref 70–99)
Glucose-Capillary: 198 mg/dL — ABNORMAL HIGH (ref 70–99)
Glucose-Capillary: 257 mg/dL — ABNORMAL HIGH (ref 70–99)
Glucose-Capillary: 109 mg/dL — ABNORMAL HIGH (ref 70–99)
Glucose-Capillary: 163 mg/dL — ABNORMAL HIGH (ref 70–99)
Glucose-Capillary: 231 mg/dL — ABNORMAL HIGH (ref 70–99)
Glucose-Capillary: 239 mg/dL — ABNORMAL HIGH (ref 70–99)
Glucose-Capillary: 127 mg/dL — ABNORMAL HIGH (ref 70–99)
Glucose-Capillary: 138 mg/dL — ABNORMAL HIGH (ref 70–99)
Glucose-Capillary: 142 mg/dL — ABNORMAL HIGH (ref 70–99)
Glucose-Capillary: 148 mg/dL — ABNORMAL HIGH (ref 70–99)
Glucose-Capillary: 154 mg/dL — ABNORMAL HIGH (ref 70–99)
Glucose-Capillary: 186 mg/dL — ABNORMAL HIGH (ref 70–99)
Glucose-Capillary: 196 mg/dL — ABNORMAL HIGH (ref 70–99)

## 2013-06-06 LAB — PREGNANCY, URINE: Preg Test, Ur: NEGATIVE

## 2013-06-06 LAB — HEMOGLOBIN A1C: Hgb A1c MFr Bld: 12.2 % — ABNORMAL HIGH (ref <5.7)

## 2013-06-06 LAB — TSH: TSH: 2.194 u[IU]/mL (ref 0.350–4.500)

## 2013-06-06 MED ORDER — INFLUENZA VAC SPLIT QUAD 0.5 ML IM SUSP
0.5000 mL | INTRAMUSCULAR | Status: AC
  Administered 2013-06-07: 0.5 mL via INTRAMUSCULAR
  Filled 2013-06-06 (×2): qty 0.5

## 2013-06-06 MED ORDER — BIOTENE DRY MOUTH MT LIQD
15.0000 mL | Freq: Two times a day (BID) | OROMUCOSAL | Status: DC
  Administered 2013-06-06 – 2013-06-07 (×4): 15 mL via OROMUCOSAL

## 2013-06-06 MED ORDER — INSULIN ASPART PROT & ASPART (70-30 MIX) 100 UNIT/ML ~~LOC~~ SUSP
50.0000 [IU] | Freq: Every day | SUBCUTANEOUS | Status: DC
  Administered 2013-06-07: 50 [IU] via SUBCUTANEOUS

## 2013-06-06 MED ORDER — INSULIN ASPART PROT & ASPART (70-30 MIX) 100 UNIT/ML ~~LOC~~ SUSP
50.0000 [IU] | Freq: Every day | SUBCUTANEOUS | Status: DC
  Administered 2013-06-06 – 2013-06-07 (×2): 50 [IU] via SUBCUTANEOUS
  Filled 2013-06-06: qty 10

## 2013-06-06 MED ORDER — SALINE SPRAY 0.65 % NA SOLN
1.0000 | NASAL | Status: DC | PRN
  Administered 2013-06-06 (×2): 1 via NASAL
  Filled 2013-06-06: qty 44

## 2013-06-06 MED ORDER — HYDROCODONE-ACETAMINOPHEN 5-325 MG PO TABS
1.0000 | ORAL_TABLET | Freq: Once | ORAL | Status: AC
  Administered 2013-06-06: 1 via ORAL
  Filled 2013-06-06: qty 1

## 2013-06-06 MED ORDER — HYDROCODONE-ACETAMINOPHEN 5-325 MG PO TABS
1.0000 | ORAL_TABLET | Freq: Four times a day (QID) | ORAL | Status: DC | PRN
  Administered 2013-06-06 – 2013-06-08 (×3): 1 via ORAL
  Filled 2013-06-06 (×4): qty 1

## 2013-06-06 MED ORDER — CHLORHEXIDINE GLUCONATE 0.12 % MT SOLN
15.0000 mL | Freq: Two times a day (BID) | OROMUCOSAL | Status: DC
  Administered 2013-06-06 – 2013-06-07 (×3): 15 mL via OROMUCOSAL
  Filled 2013-06-06 (×5): qty 15

## 2013-06-06 NOTE — Progress Notes (Signed)
CARE MANAGEMENT NOTE 06/06/2013  Patient:  [name], [name]   Account Number:  [id number]  Date Initiated:  06/06/2013  Documentation initiated by:  DAVIS,RHONDA  Subjective/Objective Assessment:   pt with hx of dka presents with thesame, admitted and on insulin drip     Action/Plan:   states she is having trouble getting insulin to cover complete needs and then runs out.   Anticipated DC Date:  06/09/2013   Anticipated DC Plan:  HOME/SELF CARE  In-house referral  NA      DC Planning Services  CM consult  Medication Assistance      Choice offered to / List presented to:  NA   DME arranged  NA      DME agency  NA     HH arranged  NA      HH agency  NA   Status of service:  In process, will continue to follow Medicare Important Message given?  NA - LOS <3 / Initial given by admissions (If response is "NO", the following Medicare IM given date fields will be blank) Date Medicare IM given:   Date Additional Medicare IM given:    Discharge Disposition:    Per UR Regulation:  Reviewed for med. necessity/level of care/duration of stay  If discussed at Long Length of Stay Meetings, dates discussed:    Comments:  [id number]/Rhonda Stark Jock, BSN, [place] [phone] Chart Reviewed for discharge and hospital needs. Discharge needs at time of review:  Patient given information to fill out patient part for med assistance from [facility] disc, md did fill out physician part and signed. Patient also given drug card assistance programs. Review of patient progress due on [id number].

## 2013-06-06 NOTE — Progress Notes (Signed)
[facility] PROGRESS NOTE  GRIZELDA PISCOPO ZOX:[id number] DOB: 23-Nov-1965 DOA: 06/05/2013 PCP: [name], MD  Assessment/Plan: DKA -On most recent BMET AG remains at 15. -Continue on insulin drip until gap closes and bicarb >20. -Keep on D5 once CBG <250.  IDDM -She is now in DKA because she cannot afford her insulin. -Have discussed with CM who will review her case. -May need to start on 70/30 for affordability issues.  Hyponatremia -Resolved.  S/p Renal Transplant -Continue immune suppressants.  Hypothyroidism -Continue synthroid.   Code Status: Full Code Family Communication: Patient only  Disposition Plan: Transfer to floor once out of DKA.   Consultants:  None   Antibiotics:  None   Subjective: Is hungry. No other complaints.  Objective: Filed Vitals:   06/06/13 0300 06/06/13 0400 06/06/13 0500 06/06/13 0800  BP:  119/52 105/38 102/30  Pulse: 79 75 73 72  Temp:  97.9 F (36.6 C)    TempSrc:  Oral    Resp: 12 21 17 13   Height:  5\' 10"  (1.778 m)    Weight:  101.4 kg (223 lb 8.7 oz)    SpO2: 95% 96% 95% 96%    Intake/Output Summary (Last 24 hours) at 06/06/13 0906 Last data filed at 06/06/13 0800  Gross per 24 hour  Intake 1683.79 ml  Output    500 ml  Net 1183.79 ml   Filed Weights   06/05/13 2005 06/05/13 2100 06/06/13 0400  Weight: 100.7 kg (222 lb 0.1 oz) 100.7 kg (222 lb 0.1 oz) 101.4 kg (223 lb 8.7 oz)    Exam:   General:  AA Ox3, NAD  Cardiovascular: RRR, no M/R/G  Respiratory: CTA B  Abdomen: S/NT/ND/+BS  Extremities: no C/C/E/+pedal pulses/chronic venous insufficiency changes.   Neurologic:  Grossly intact and non-focal.  Data Reviewed: Basic Metabolic Panel:  Recent Labs Lab 06/05/13 1745 06/05/13 2055 06/05/13 2255 06/06/13 0059 06/06/13 0234  NA 127* 131* 132* 133* 134*  K 5.7* 4.2 4.2 4.0 3.9  CL 85* 92* 95* 99 99  CO2 13* 14* 14* 18* 20  GLUCOSE 630* 442* 294* 186* 136*  BUN 43* 43* 43*  43* 40*  CREATININE 1.41* 1.39* 1.34* 1.30* 1.23*  CALCIUM 11.0* 10.3 10.4 10.1 10.1   Liver Function Tests: No results found for this basename: AST, ALT, ALKPHOS, BILITOT, PROT, ALBUMIN,  in the last 168 hours No results found for this basename: LIPASE, AMYLASE,  in the last 168 hours No results found for this basename: AMMONIA,  in the last 168 hours CBC:  Recent Labs Lab 06/05/13 1745 06/05/13 2055  WBC 6.9 6.0  HGB 15.3* 13.9  HCT 45.9 41.6  MCV 87.8 87.2  PLT 176 169   Cardiac Enzymes:  Recent Labs Lab 06/05/13 2055  TROPONINI <0.30   BNP (last 3 results) No results found for this basename: PROBNP,  in the last 8760 hours CBG:  Recent Labs Lab 06/06/13 0427 06/06/13 0538 06/06/13 0638 06/06/13 0745 06/06/13 0853  GLUCAP 119* 142* 163* 239* 231*    Recent Results (from the past 240 hour(s))  MRSA PCR SCREENING     Status: None   Collection Time    06/05/13  8:42 PM      Result Value Range Status   MRSA by PCR NEGATIVE  NEGATIVE Final   Comment:            The GeneXpert MRSA Assay (FDA     approved for NASAL specimens     only), is  one component of a     comprehensive MRSA colonization     surveillance program. It is not     intended to diagnose MRSA     infection nor to guide or     monitor treatment for     MRSA infections.     Performed at [facility]     Studies: No results found.  Scheduled Meds: . amLODipine  5 mg Oral Daily  . antiseptic oral rinse  15 mL Mouth Rinse q12n4p  . carvedilol  50 mg Oral BID  . chlorhexidine  15 mL Mouth Rinse BID  . enoxaparin (LOVENOX) injection  50 mg Subcutaneous Q24H  . famotidine  20 mg Oral Daily  . levothyroxine  137 mcg Oral QAC breakfast  . multivitamin with minerals  1 tablet Oral Daily  . mycophenolate  500 mg Oral BID  . potassium chloride      . predniSONE  5 mg Oral Q breakfast  . tacrolimus  1 mg Oral BID   Continuous Infusions: . sodium chloride 125 mL/hr at 06/05/13 2035  .  dextrose 5 % and 0.45% NaCl 125 mL (06/06/13 0330)  . insulin (NOVOLIN-R) infusion 5.1 Units/hr (06/06/13 0853)    Principal Problem:   DKA (diabetic ketoacidoses) Active Problems:   Hypothyroidism   Hyponatremia   H/O kidney transplant    Time spent: 35 minutes    HERNANDEZ ACOSTA,ESTELA  [facility] Pager [phone]  If 7PM-7AM, please contact night-coverage at [email or web address], password [facility] 06/06/2013, 9:06 AM  LOS: 1 day

## 2013-06-06 NOTE — Progress Notes (Signed)
Inpatient Diabetes Program Recommendations  AACE/ADA: New Consensus Statement on Inpatient Glycemic Control (2013)  Target Ranges:  Prepandial:   less than 140 mg/dL      Peak postprandial:   less than 180 mg/dL (1-2 hours)      Critically ill patients:  140 - 180 mg/dL   Reason for Visit: DKA Admission  Note:  Note that patient tried to stretch insulin at home due to financial constraints.  Patient states that she had started getting her NPH insulin from [employer] for about $25 per vial, but Novolog very expensive.  Note MD considering switch to 70/30 to help with financial burden.  Patient states that she has been on 70/30 in the past, but does not remember dose.  Wants to lose weight.  States that the transplant advisor told her she needed to weight less than 230 pounds before she can get pancreas transplant.  Currently weight is 223 pounds.  Unfortunately inadequate insulin may have contributed to weight loss, but did not discuss this with patient.  Uses a generic meter and test strips from [employer].  Discussed with patient that 70/30 insulin helps both with mealtime coverage and non-meal needs.  Advised her to eat on a regular schedule-- not skipping any meals.  May need small snacks between meals.  Will also need a small snack at bedtime that contains protein.  If she loses weight she will likely need less insulin.  Will need to contact Dr. Arrie Aran prn for insulin dosage adjustments.  Informed her that she can purchase generic 70/30 insulin at [facility] for $25.  Gave her a sheet of paper with correct 70/30 insulin circled, reminders about meals and snacks, and reminder to have a treatment for hypoglycemia with her at all times.    Patient feels as though she will be able to eat when MD is ready to transition off insulin drip.  Patient will probably need at least 1 unit/kg of insulin-- which would be approximately 50 units of 70/30 bid with breakfast and supper.  If patient transitioned at  supper, could receive 70/30 with supper and drip could be stopped one hour later.  If transitioned earlier, could receive one-time dose of NPH 35 units, drip could be stopped one hour later, and could receive one-time dose of Novolog 15 units with supper.  Will need HS snack order while in hospital if on 70/30 regimen.     Thank you.  Aswad Wandrey S. Elsie Lincoln, RN, CNS, CDE Inpatient Diabetes Program, team pager [phone]

## 2013-06-06 NOTE — Progress Notes (Signed)
Nutrition Brief Note  Patient identified on the Malnutrition Screening Tool (MST) Report. Reviewed weight hx and chart. Per diabetic coordinator note: Pt wants to lose weight. States that the transplant advisor told her she needed to weight less than 230 lb before she can get pancreas transplant. Currently weight is 223 lb. Noted that inadequate insulin may have contributed to weight loss. Pt expressed that she will be ready to eat once diet advanced. Pt has difficulty affording insulin regimen.   Wt Readings from Last 15 Encounters:  06/06/13 223 lb 8.7 oz (101.4 kg)  03/07/13 240 lb 11.9 oz (109.2 kg)  01/06/13 236 lb 5.3 oz (107.2 kg)  10/24/12 242 lb (109.77 kg)  08/08/12 243 lb 4.8 oz (110.36 kg)  07/04/12 244 lb (110.678 kg)  02/05/12 243 lb 11.2 oz (110.542 kg)  11/20/11 260 lb (117.935 kg)  09/23/11 257 lb 15 oz (117 kg)  06/12/08 249 lb 6.4 oz (113.127 kg)    Body mass index is 32.08 kg/(m^2). Patient meets criteria for Obese Class I based on current BMI.   Current diet order is NPO. Labs and medications reviewed.   No nutrition interventions warranted at this time. If nutrition issues arise, please consult RD.   Jarold Motto MS, RD, LDN Pager: [phone] After-hours pager: [phone]

## 2013-06-07 DIAGNOSIS — E119 Type 2 diabetes mellitus without complications: Secondary | ICD-10-CM

## 2013-06-07 LAB — GLUCOSE, CAPILLARY
Glucose-Capillary: 209 mg/dL — ABNORMAL HIGH (ref 70–99)
Glucose-Capillary: 193 mg/dL — ABNORMAL HIGH (ref 70–99)
Glucose-Capillary: 210 mg/dL — ABNORMAL HIGH (ref 70–99)

## 2013-06-07 LAB — BASIC METABOLIC PANEL
BUN: 18 mg/dL (ref 6–23)
CO2: 22 mEq/L (ref 19–32)
Calcium: 9.8 mg/dL (ref 8.4–10.5)
Chloride: 103 mEq/L (ref 96–112)
Creatinine, Ser: 1.03 mg/dL (ref 0.50–1.10)
GFR calc Af Amer: 74 mL/min — ABNORMAL LOW (ref >90)
GFR calc non Af Amer: 64 mL/min — ABNORMAL LOW (ref >90)
Glucose, Bld: 203 mg/dL — ABNORMAL HIGH (ref 70–99)
Potassium: 3.6 mEq/L (ref 3.5–5.1)
Sodium: 135 mEq/L (ref 135–145)

## 2013-06-07 MED ORDER — SODIUM CHLORIDE 0.9 % IJ SOLN
3.0000 mL | Freq: Two times a day (BID) | INTRAMUSCULAR | Status: DC
  Administered 2013-06-07 – 2013-06-09 (×4): 3 mL via INTRAVENOUS

## 2013-06-07 MED ORDER — SODIUM CHLORIDE 0.9 % IV SOLN
INTRAVENOUS | Status: DC
  Administered 2013-06-07 (×2): 100 mL/h via INTRAVENOUS

## 2013-06-07 NOTE — Progress Notes (Signed)
[facility] PROGRESS NOTE  SWEDEN LESURE ZOX:[id number] DOB: Aug 09, 1966 DOA: 06/05/2013 PCP: [name], MD  Assessment/Plan: DKA  -anion gap has closed -transitioned to 70/30 insulin 50 units bid -bedtime snack -am lipid panel -saline lock IVF as pt eating better IDDM  -She is now in DKA because she cannot afford her insulin.  -Have discussed with CM who will review her case.  -May need to start on 70/30 for affordability issues. -HbA1C= 12.2  Hyponatremia  -Resolved.  S/p Renal Transplant  -Continue immune suppressants.  Hypothyroidism  -Continue synthroid.  -TSH 2.194 Code Status: Full Code  Family Communication: Patient only  Disposition Plan: home 06/08/13 if stable          Subjective: Patient is feeling better. She denies any fevers, chills, chest discomfort, shortness breath, nausea, vomiting, diarrhea,, pain, dysuria, hematuria. No rashes. No abdominal pain.  Objective: Filed Vitals:   06/07/13 0800 06/07/13 1000 06/07/13 1200 06/07/13 1212  BP: 139/67  143/76 143/76  Pulse: 74 97 78   Temp: 97.6 F (36.4 C)  98.4 F (36.9 C)   TempSrc: Oral  Oral   Resp: 18 17 21    Height:      Weight:      SpO2: 97% 86% 96%     Intake/Output Summary (Last 24 hours) at 06/07/13 1300 Last data filed at 06/07/13 0900  Gross per 24 hour  Intake 2364.37 ml  Output   2275 ml  Net  89.37 ml   Weight change:  Exam:   General:  Pt is alert, follows commands appropriately, not in acute distress  HEENT: No icterus, No thrush,[place]/AT  Cardiovascular: RRR, S1/S2, no rubs, no gallops  Respiratory: CTA bilaterally, no wheezing, no crackles, no rhonchi  Abdomen: Soft/+BS, non tender, non distended, no guarding  Extremities: 1+ edema, No lymphangitis, No petechiae, No rashes, no synovitis  Data Reviewed: Basic Metabolic Panel:  Recent Labs Lab 06/06/13 1045 06/06/13 1352 06/06/13 1816 06/06/13 2240 06/07/13 0240  NA 133* 131* 132* 133* 135   K 4.0 4.1 4.1 3.7 3.6  CL 102 100 99 102 103  CO2 21 19 21 21 22   GLUCOSE 189* 144* 136* 214* 203*  BUN 33* 29* 24* 20 18  CREATININE 1.04 0.99 0.92 1.03 1.03  CALCIUM 9.8 9.9 10.1 9.7 9.8   Liver Function Tests: No results found for this basename: AST, ALT, ALKPHOS, BILITOT, PROT, ALBUMIN,  in the last 168 hours No results found for this basename: LIPASE, AMYLASE,  in the last 168 hours No results found for this basename: AMMONIA,  in the last 168 hours CBC:  Recent Labs Lab 06/05/13 1745 06/05/13 2055  WBC 6.9 6.0  HGB 15.3* 13.9  HCT 45.9 41.6  MCV 87.8 87.2  PLT 176 169   Cardiac Enzymes:  Recent Labs Lab 06/05/13 2055  TROPONINI <0.30   BNP: No components found with this basename: POCBNP,  CBG:  Recent Labs Lab 06/06/13 1857 06/06/13 2017 06/06/13 2127 06/07/13 0747 06/07/13 1202  GLUCAP 138* 142* 200* 209* 111*    Recent Results (from the past 240 hour(s))  MRSA PCR SCREENING     Status: None   Collection Time    06/05/13  8:42 PM      Result Value Range Status   MRSA by PCR NEGATIVE  NEGATIVE Final   Comment:            The GeneXpert MRSA Assay (FDA     approved for NASAL specimens  only), is one component of a     comprehensive MRSA colonization     surveillance program. It is not     intended to diagnose MRSA     infection nor to guide or     monitor treatment for     MRSA infections.     Performed at [facility]     Scheduled Meds: . amLODipine  5 mg Oral Daily  . antiseptic oral rinse  15 mL Mouth Rinse q12n4p  . carvedilol  50 mg Oral BID  . chlorhexidine  15 mL Mouth Rinse BID  . enoxaparin (LOVENOX) injection  50 mg Subcutaneous Q24H  . famotidine  20 mg Oral Daily  . insulin aspart protamine- aspart  50 Units Subcutaneous Q supper  . insulin aspart protamine- aspart  50 Units Subcutaneous Q breakfast  . levothyroxine  137 mcg Oral QAC breakfast  . multivitamin with minerals  1 tablet Oral Daily  . mycophenolate   500 mg Oral BID  . predniSONE  5 mg Oral Q breakfast  . tacrolimus  1 mg Oral BID   Continuous Infusions: . sodium chloride 100 mL/hr (06/07/13 0400)     [name] Mclees, DO  [facility] Pager [phone]  If 7PM-7AM, please contact night-coverage [email or web address] Password [email or web address] 06/07/2013, 1:00 PM   LOS: 2 days

## 2013-06-08 LAB — BASIC METABOLIC PANEL
BUN: 13 mg/dL (ref 6–23)
Calcium: 9.9 mg/dL (ref 8.4–10.5)
Chloride: 107 mEq/L (ref 96–112)
GFR calc non Af Amer: 66 mL/min — ABNORMAL LOW (ref >90)
Glucose, Bld: 76 mg/dL (ref 70–99)
Potassium: 3.6 mEq/L (ref 3.5–5.1)

## 2013-06-08 LAB — LIPID PANEL
HDL: 49 mg/dL (ref >39)
LDL Cholesterol: 89 mg/dL (ref 0–99)
Total CHOL/HDL Ratio: 3.4 RATIO

## 2013-06-08 LAB — GLUCOSE, CAPILLARY
Glucose-Capillary: 40 mg/dL — CL (ref 70–99)
Glucose-Capillary: 82 mg/dL (ref 70–99)
Glucose-Capillary: 111 mg/dL — ABNORMAL HIGH (ref 70–99)

## 2013-06-08 MED ORDER — INSULIN ASPART PROT & ASPART (70-30 MIX) 100 UNIT/ML ~~LOC~~ SUSP
25.0000 [IU] | Freq: Two times a day (BID) | SUBCUTANEOUS | Status: DC
  Administered 2013-06-08: 25 [IU] via SUBCUTANEOUS
  Administered 2013-06-09: 20 [IU] via SUBCUTANEOUS

## 2013-06-08 MED ORDER — INSULIN ASPART PROT & ASPART (70-30 MIX) 100 UNIT/ML ~~LOC~~ SUSP
40.0000 [IU] | Freq: Two times a day (BID) | SUBCUTANEOUS | Status: DC
  Administered 2013-06-08: 40 [IU] via SUBCUTANEOUS

## 2013-06-08 NOTE — Discharge Summary (Signed)
Physician Discharge Summary  [name] ZOX:[id number] DOB: 1966/06/13 DOA: 06/05/2013  PCP: [name], MD  Admit date: 06/05/2013 Discharge date: 06/08/2013  Recommendations for Outpatient Follow-up:  1. Pt will need to follow up with PCP in 2 weeks post discharge 2. Please obtain BMP to evaluate electrolytes and kidney function 3. Please also check CBC to evaluate Hg and Hct levels   Discharge Diagnoses:  Principal Problem:   DKA (diabetic ketoacidoses) Active Problems:   Hypothyroidism   Hyponatremia   H/O kidney transplant DKA  -Patient was started on IV fluids as well as intravenous insulin. -anion gap has closed  -transitioned to 70/30 insulin 50 units bid  -Patient had episode of morning hypoglycemia at 7:30 AM despite bedtime snack -70/30 insulin decreased to 40 units bid -bedtime snack  -lipid panel--LDL 89-->lifestyle modification -saline lock IVF as pt eating better  IDDM  -She is now in DKA because she cannot afford her insulin.  -Have discussed with CM who will review her case.  -continue 70/30 for affordability issues.  -HbA1C= 12.2  AKI -due to volume depletion -improved with IVF -Serum creatinine 1.00 on the discharge Hyponatremia  -Resolved.  S/p Renal Transplant  -Continue immune suppressants -f/u with Dr. Arrie Aran.  Hypothyroidism  -Continue synthroid.  -TSH 2.194  Code Status: Full Code  Family Communication: Patient only     Discharge Condition: Stable  Disposition:  home  Diet:1800 calorie ADA Wt Readings from Last 3 Encounters:  06/07/13 101.152 kg (223 lb)  03/07/13 109.2 kg (240 lb 11.9 oz)  01/06/13 107.2 kg (236 lb 5.3 oz)    History of present illness:  47 y.o. female history of diabetes mellitus, renal transplant, hypothyroidism presents with complaints of increased thirst and weakness. Patient states that due to financial issues patient has been trying to stretch her dose of insulin and was not taking as  advised. In the ER patient was found to be having elevated blood sugar with anion gap. Patient has been admitted for DKA. Patient denies any nausea vomiting abdominal pain fever chills chest pain shortness of breath or any skin rash or lesions or any diarrhea     Discharge Exam: Filed Vitals:   06/08/13 0518  BP: 120/59  Pulse: 75  Temp: 97.4 F (36.3 C)  Resp: 16   Filed Vitals:   06/07/13 1600 06/07/13 1637 06/07/13 2205 06/08/13 0518  BP: 139/70 116/67 127/74 120/59  Pulse: 72 74 76 75  Temp: 97.3 F (36.3 C) 97.6 F (36.4 C) 97.9 F (36.6 C) 97.4 F (36.3 C)  TempSrc: Oral Oral Oral Oral  Resp: 20 20 20 16   Height:  5\' 10"  (1.778 m)    Weight:  101.152 kg (223 lb)    SpO2: 93% 97% 98% 96%   General: A&O x 3, NAD, pleasant, cooperative Cardiovascular: RRR, no rub, no gallop, no S3 Respiratory: CTAB, no wheeze, no rhonchi Abdomen:soft, nontender, nondistended, positive bowel sounds Extremities: trace edema, No lymphangitis, no petechiae  Discharge Instructions     Medication List    ASK your doctor about these medications       amLODipine 5 MG tablet  Commonly known as:  NORVASC  Take 1 tablet (5 mg total) by mouth daily.     carvedilol 25 MG tablet  Commonly known as:  COREG  Take 2 tablets (50 mg total) by mouth 2 (two) times daily.     insulin aspart 100 UNIT/ML injection  Commonly known as:  novoLOG  Inject 0-9 Units  into the skin 3 (three) times daily with meals. For blood sugar less than 70: eat or drink something sweet and recheck, 70 - 120: 0 units, 121 - 150: 1 unit, 151 - 200: 2 units, 201 - 250: 3 units, 251 - 300: 5 units, 301 - 350: 7 units, 351 - 400: 9 units & for blood sugar greater than 400: call MD.     insulin NPH 100 UNIT/ML injection  Commonly known as:  HUMULIN N,NOVOLIN N  Inject 40 Units into the skin 2 (two) times daily before a meal.     levothyroxine 137 MCG tablet  Commonly known as:  SYNTHROID, LEVOTHROID  Take 137 mcg by  mouth daily. Name brand     multivitamin with minerals Tabs tablet  Take 1 tablet by mouth daily.     mycophenolate 250 MG capsule  Commonly known as:  CELLCEPT  Take 500 mg by mouth 2 (two) times daily.     predniSONE 5 MG tablet  Commonly known as:  DELTASONE  Take 5 mg by mouth daily.     ranitidine 150 MG capsule  Commonly known as:  ZANTAC  Take 150 mg by mouth at bedtime.     tacrolimus 1 MG capsule  Commonly known as:  PROGRAF  Take 1 mg by mouth 2 (two) times daily.         The results of significant diagnostics from this hospitalization (including imaging, microbiology, ancillary and laboratory) are listed below for reference.    Significant Diagnostic Studies: No results found.   Microbiology: Recent Results (from the past 240 hour(s))  MRSA PCR SCREENING     Status: None   Collection Time    06/05/13  8:42 PM      Result Value Range Status   MRSA by PCR NEGATIVE  NEGATIVE Final   Comment:            The GeneXpert MRSA Assay (FDA     approved for NASAL specimens     only), is one component of a     comprehensive MRSA colonization     surveillance program. It is not     intended to diagnose MRSA     infection nor to guide or     monitor treatment for     MRSA infections.     Performed at [facility]     Labs: Basic Metabolic Panel:  Recent Labs Lab 06/06/13 1352 06/06/13 1816 06/06/13 2240 06/07/13 0240 06/08/13 0400  NA 131* 132* 133* 135 139  K 4.1 4.1 3.7 3.6 3.6  CL 100 99 102 103 107  CO2 19 21 21 22 25   GLUCOSE 144* 136* 214* 203* 76  BUN 29* 24* 20 18 13   CREATININE 0.99 0.92 1.03 1.03 1.00  CALCIUM 9.9 10.1 9.7 9.8 9.9   Liver Function Tests: No results found for this basename: AST, ALT, ALKPHOS, BILITOT, PROT, ALBUMIN,  in the last 168 hours No results found for this basename: LIPASE, AMYLASE,  in the last 168 hours No results found for this basename: AMMONIA,  in the last 168 hours CBC:  Recent Labs Lab  06/05/13 1745 06/05/13 2055  WBC 6.9 6.0  HGB 15.3* 13.9  HCT 45.9 41.6  MCV 87.8 87.2  PLT 176 169   Cardiac Enzymes:  Recent Labs Lab 06/05/13 2055  TROPONINI <0.30   BNP: No components found with this basename: POCBNP,  CBG:  Recent Labs Lab 06/07/13 1202 06/07/13 1559 06/07/13 1752 06/07/13  2158 06/08/13 0738  GLUCAP 111* 193* 210* 132* 40*    Time coordinating discharge:  Greater than 30 minutes  Signed:  Seville Brick, DO [facility] Pager: [phone] 06/08/2013, 9:40 AM

## 2013-06-09 DIAGNOSIS — I10 Essential (primary) hypertension: Secondary | ICD-10-CM

## 2013-06-09 LAB — GLUCOSE, CAPILLARY
Glucose-Capillary: 50 mg/dL — ABNORMAL LOW (ref 70–99)
Glucose-Capillary: 55 mg/dL — ABNORMAL LOW (ref 70–99)
Glucose-Capillary: 58 mg/dL — ABNORMAL LOW (ref 70–99)
Glucose-Capillary: 60 mg/dL — ABNORMAL LOW (ref 70–99)
Glucose-Capillary: 87 mg/dL (ref 70–99)
Glucose-Capillary: 189 mg/dL — ABNORMAL HIGH (ref 70–99)

## 2013-06-09 LAB — BASIC METABOLIC PANEL
GFR calc Af Amer: 86 mL/min — ABNORMAL LOW (ref >90)
GFR calc non Af Amer: 75 mL/min — ABNORMAL LOW (ref >90)
Glucose, Bld: 104 mg/dL — ABNORMAL HIGH (ref 70–99)
Potassium: 4.1 mEq/L (ref 3.5–5.1)
Sodium: 139 mEq/L (ref 135–145)

## 2013-06-09 MED ORDER — INSULIN ASPART PROT & ASPART (70-30 MIX) 100 UNIT/ML ~~LOC~~ SUSP: 25.0000 [IU] | Freq: Two times a day (BID) | SUBCUTANEOUS | Status: DC

## 2013-06-09 MED ORDER — PREDNISONE 10 MG PO TABS
10.0000 mg | ORAL_TABLET | Freq: Every day | ORAL | Status: DC
  Administered 2013-06-09: 10 mg via ORAL
  Filled 2013-06-09 (×2): qty 1

## 2013-06-09 MED ORDER — INSULIN ASPART PROT & ASPART (70-30 MIX) 100 UNIT/ML ~~LOC~~ SUSP: 15.0000 [IU] | Freq: Two times a day (BID) | SUBCUTANEOUS | Status: DC

## 2013-06-09 MED ORDER — INSULIN ASPART PROT & ASPART (70-30 MIX) 100 UNIT/ML ~~LOC~~ SUSP
15.0000 [IU] | Freq: Two times a day (BID) | SUBCUTANEOUS | Status: DC
  Administered 2013-06-09: 15 [IU] via SUBCUTANEOUS

## 2013-06-09 MED ORDER — PREDNISONE 5 MG PO TABS: 15.0000 mg | ORAL_TABLET | Freq: Every day | ORAL | Status: DC

## 2013-06-09 MED ORDER — GLUCOSE 40 % PO GEL
ORAL | Status: AC
  Administered 2013-06-09: 37.5 g
  Filled 2013-06-09: qty 1

## 2013-06-09 MED ORDER — PREDNISONE 10 MG PO TABS: 10.0000 mg | ORAL_TABLET | Freq: Every day | ORAL | Status: DC

## 2013-06-09 NOTE — Progress Notes (Signed)
Hypoglycemic Event  CBG: 50 (at 6:30)  Treatment: 2 40z Orange Juices  Symptoms: pt. Just felt like it was low.  Follow-up CBG: Time:0700am   CBG Result:58  Possible Reasons for Event: unknown   Treatment: Patient eating breakfast  Follow-up CBG: Time:7:30 CBG Result:60  Treatment: Patient given glucose gel tube at 07:30am (Day shift RN to follow up with this CBG)  Comments/MD notified: Day shift RN to follow up with MD before administering 8am insulin   Mills, Rolena Knutson [place]  Remember to initiate Hypoglycemia Order Set & complete

## 2013-06-09 NOTE — Discharge Summary (Signed)
Physician Discharge Summary  [name] GNF:[id number] DOB: 11/13/1965 DOA: 06/05/2013  PCP: [name] Cords, MD  Admit date: 06/05/2013 Discharge date: 06/09/2013  Recommendations for Outpatient Follow-up:  1. Pt will need to follow up with PCP in 2 weeks post discharge 2. Please obtain BMP to evaluate electrolytes and kidney function 3. Please also check CBC to evaluate Hg and Hct levels   Discharge Diagnoses:  Principal Problem:   DKA (diabetic ketoacidoses) Active Problems:   Hypothyroidism   Hyponatremia   H/O kidney transplant DKA  -Patient was started on IV fluids as well as intravenous insulin.  -anion gap has closed  -transitioned to 70/30 insulin 50 units bid  -Patient had hypoglycemic episodes on 10/2 and 10/3 am so d/c was delayed until 06/09/13 -70/30 insulin decreased to 15 units bid  -Because of the recurrent hypoglycemia and intermittent nausea, there is concern of possible relative adrenal insufficiency as the patient has been on chronic prednisone. Therefore, her prednisone was increased to 10 mg daily for the next 7 days. She will then return to 5 mg daily thereafter. -bedtime snack  -lipid panel--LDL 89-->lifestyle modification  -saline lock IVF -With the adjustments noted above including her 70/30 insulin and increased prednisone, the patient's hypoglycemia resolved. IDDM  -She is now in DKA because she cannot afford her insulin.  -Have discussed with CM who will review her case.  -continue 70/30 for affordability issues.  -HbA1C= 12.2  AKI  -due to volume depletion  -improved with IVF  -Serum creatinine 1.00 on the discharge  Hyponatremia  -Resolved.  S/p Renal Transplant  -Continue immune suppressants  -f/u with Dr. Arrie [name].  Hypothyroidism  -Continue synthroid.  -TSH 2.194    Discharge Condition: stable  Disposition: home  Diet:carb modified Wt Readings from Last 3 Encounters:  06/07/13 101.152 kg (223 lb)  03/07/13 109.2 kg  (240 lb 11.9 oz)  01/06/13 107.2 kg (236 lb 5.3 oz)    History of present illness:   47 y.o. female history of diabetes mellitus, renal transplant, hypothyroidism presents with complaints of increased thirst and weakness. Patient states that due to financial issues patient has been trying to stretch her dose of insulin and was not taking as advised. In the ER patient was found to be having elevated blood sugar with anion gap. Patient has been admitted for DKA. Patient denies any nausea vomiting abdominal pain fever chills chest pain shortness of breath or any skin rash or lesions or any diarrhea    Discharge Exam: Filed Vitals:   06/09/13 1300  BP: 131/81  Pulse: 80  Temp: 98.2 F (36.8 C)  Resp: 16   Filed Vitals:   06/08/13 1400 06/08/13 2120 06/09/13 0655 06/09/13 1300  BP: 114/52 123/79 100/83 131/81  Pulse: 73 74 71 80  Temp: 97.9 F (36.6 C) 97.8 F (36.6 C) 97.5 F (36.4 C) 98.2 F (36.8 C)  TempSrc: Oral Oral Oral Oral  Resp: 16 16 16 16   Height:      Weight:      SpO2: 100% 98% 100% 98%   General: A&O x 3, NAD, pleasant, cooperative Cardiovascular: RRR, no rub, no gallop, no S3 Respiratory: CTAB, no wheeze, no rhonchi Abdomen:soft, nontender, nondistended, positive bowel sounds Extremities: No edema, No lymphangitis, no petechiae  Discharge Instructions      Discharge Orders   Future Orders Complete By Expires   Diet - low sodium heart healthy  As directed    Discharge instructions  As directed    Comments:  Take 70/30 insulin--15 units at breakfast and at dinner time. Take prednisone 10mg  daily x 6 days, then go back to 5mg  daily on day #7 Check your sugars before each meal and before bedtime.  Write them down and take log to your doctor to make adjustments to your insulin   Increase activity slowly  As directed        Medication List    STOP taking these medications       insulin aspart 100 UNIT/ML injection  Commonly known as:  novoLOG      insulin NPH 100 UNIT/ML injection  Commonly known as:  HUMULIN N,NOVOLIN N      TAKE these medications       amLODipine 5 MG tablet  Commonly known as:  NORVASC  Take 1 tablet (5 mg total) by mouth daily.     carvedilol 25 MG tablet  Commonly known as:  COREG  Take 2 tablets (50 mg total) by mouth 2 (two) times daily.     insulin aspart protamine- aspart (70-30) 100 UNIT/ML injection  Commonly known as:  NOVOLOG MIX 70/30  Inject 0.15 mLs (15 Units total) into the skin 2 (two) times daily with a meal.     levothyroxine 137 MCG tablet  Commonly known as:  SYNTHROID, LEVOTHROID  Take 137 mcg by mouth daily. Name brand     multivitamin with minerals Tabs tablet  Take 1 tablet by mouth daily.     mycophenolate 250 MG capsule  Commonly known as:  CELLCEPT  Take 500 mg by mouth 2 (two) times daily.     predniSONE 5 MG tablet  Commonly known as:  DELTASONE  Take 5 mg by mouth daily.     predniSONE 10 MG tablet  Commonly known as:  DELTASONE  Take 1 tablet (10 mg total) by mouth daily with breakfast.     ranitidine 150 MG capsule  Commonly known as:  ZANTAC  Take 150 mg by mouth at bedtime.     tacrolimus 1 MG capsule  Commonly known as:  PROGRAF  Take 1 mg by mouth 2 (two) times daily.         The results of significant diagnostics from this hospitalization (including imaging, microbiology, ancillary and laboratory) are listed below for reference.    Significant Diagnostic Studies: No results found.   Microbiology: Recent Results (from the past 240 hour(s))  MRSA PCR SCREENING     Status: None   Collection Time    06/05/13  8:42 PM      Result Value Range Status   MRSA by PCR NEGATIVE  NEGATIVE Final   Comment:            The GeneXpert MRSA Assay (FDA     approved for NASAL specimens     only), is one component of a     comprehensive MRSA colonization     surveillance program. It is not     intended to diagnose MRSA     infection nor to guide or      monitor treatment for     MRSA infections.     Performed at [facility]     Labs: Basic Metabolic Panel:  Recent Labs Lab 06/06/13 1816 06/06/13 2240 06/07/13 0240 06/08/13 0400 06/09/13 0825  NA 132* 133* 135 139 139  K 4.1 3.7 3.6 3.6 4.1  CL 99 102 103 107 104  CO2 21 21 22 25 27   GLUCOSE 136* 214* 203* 76 104*  BUN 24* 20 18 13 12   CREATININE 0.92 1.03 1.03 1.00 0.91  CALCIUM 10.1 9.7 9.8 9.9 10.1   Liver Function Tests: No results found for this basename: AST, ALT, ALKPHOS, BILITOT, PROT, ALBUMIN,  in the last 168 hours No results found for this basename: LIPASE, AMYLASE,  in the last 168 hours No results found for this basename: AMMONIA,  in the last 168 hours CBC:  Recent Labs Lab 06/05/13 1745 06/05/13 2055  WBC 6.9 6.0  HGB 15.3* 13.9  HCT 45.9 41.6  MCV 87.8 87.2  PLT 176 169   Cardiac Enzymes:  Recent Labs Lab 06/05/13 2055  TROPONINI <0.30   BNP: No components found with this basename: POCBNP,  CBG:  Recent Labs Lab 06/09/13 0711 06/09/13 0742 06/09/13 0807 06/09/13 0853 06/09/13 1239  GLUCAP 55* 60* 87 189* 193*    Time coordinating discharge:  Greater than 30 minutes  Signed:  Shaina Gullatt, DO [facility] Pager: [phone] 06/09/2013, 3:43 PM

## 2013-08-04 ENCOUNTER — Inpatient Hospital Stay (HOSPITAL_COMMUNITY): Payer: Medicare Other

## 2013-08-04 ENCOUNTER — Inpatient Hospital Stay (HOSPITAL_COMMUNITY)
Admission: EM | Admit: 2013-08-04 | Discharge: 2013-08-06 | DRG: 637 | Disposition: A | Discharge status: Home / Self Care | Payer: Medicare Other | Attending: Internal Medicine | Admitting: Internal Medicine

## 2013-08-04 ENCOUNTER — Encounter (HOSPITAL_COMMUNITY): Payer: Self-pay | Admitting: Emergency Medicine

## 2013-08-04 DIAGNOSIS — Z6831 Body mass index (BMI) 31.0-31.9, adult: Secondary | ICD-10-CM

## 2013-08-04 DIAGNOSIS — Z87891 Personal history of nicotine dependence: Secondary | ICD-10-CM

## 2013-08-04 DIAGNOSIS — E1110 Type 2 diabetes mellitus with ketoacidosis without coma: Secondary | ICD-10-CM | POA: Diagnosis present

## 2013-08-04 DIAGNOSIS — E871 Hypo-osmolality and hyponatremia: Secondary | ICD-10-CM | POA: Diagnosis present

## 2013-08-04 DIAGNOSIS — R197 Diarrhea, unspecified: Secondary | ICD-10-CM | POA: Diagnosis present

## 2013-08-04 DIAGNOSIS — S98139A Complete traumatic amputation of one unspecified lesser toe, initial encounter: Secondary | ICD-10-CM

## 2013-08-04 DIAGNOSIS — R7309 Other abnormal glucose: Secondary | ICD-10-CM

## 2013-08-04 DIAGNOSIS — Z9119 Patient's noncompliance with other medical treatment and regimen: Secondary | ICD-10-CM

## 2013-08-04 DIAGNOSIS — R112 Nausea with vomiting, unspecified: Secondary | ICD-10-CM | POA: Diagnosis present

## 2013-08-04 DIAGNOSIS — E1010 Type 1 diabetes mellitus with ketoacidosis without coma: Secondary | ICD-10-CM

## 2013-08-04 DIAGNOSIS — E875 Hyperkalemia: Secondary | ICD-10-CM

## 2013-08-04 DIAGNOSIS — Z940 Kidney transplant status: Secondary | ICD-10-CM

## 2013-08-04 DIAGNOSIS — Z794 Long term (current) use of insulin: Secondary | ICD-10-CM

## 2013-08-04 DIAGNOSIS — E872 Acidosis: Secondary | ICD-10-CM

## 2013-08-04 DIAGNOSIS — R0989 Other specified symptoms and signs involving the circulatory and respiratory systems: Secondary | ICD-10-CM | POA: Diagnosis present

## 2013-08-04 DIAGNOSIS — F329 Major depressive disorder, single episode, unspecified: Secondary | ICD-10-CM | POA: Diagnosis present

## 2013-08-04 DIAGNOSIS — I120 Hypertensive chronic kidney disease with stage 5 chronic kidney disease or end stage renal disease: Secondary | ICD-10-CM | POA: Diagnosis present

## 2013-08-04 DIAGNOSIS — T82898A Other specified complication of vascular prosthetic devices, implants and grafts, initial encounter: Secondary | ICD-10-CM

## 2013-08-04 DIAGNOSIS — A419 Sepsis, unspecified organism: Secondary | ICD-10-CM

## 2013-08-04 DIAGNOSIS — R739 Hyperglycemia, unspecified: Secondary | ICD-10-CM | POA: Diagnosis present

## 2013-08-04 DIAGNOSIS — Z9849 Cataract extraction status, unspecified eye: Secondary | ICD-10-CM

## 2013-08-04 DIAGNOSIS — R0609 Other forms of dyspnea: Secondary | ICD-10-CM | POA: Diagnosis present

## 2013-08-04 DIAGNOSIS — Z823 Family history of stroke: Secondary | ICD-10-CM

## 2013-08-04 DIAGNOSIS — E119 Type 2 diabetes mellitus without complications: Secondary | ICD-10-CM

## 2013-08-04 DIAGNOSIS — Z8614 Personal history of Methicillin resistant Staphylococcus aureus infection: Secondary | ICD-10-CM

## 2013-08-04 DIAGNOSIS — D72829 Elevated white blood cell count, unspecified: Secondary | ICD-10-CM

## 2013-08-04 DIAGNOSIS — N186 End stage renal disease: Secondary | ICD-10-CM | POA: Diagnosis present

## 2013-08-04 DIAGNOSIS — E039 Hypothyroidism, unspecified: Secondary | ICD-10-CM | POA: Diagnosis present

## 2013-08-04 DIAGNOSIS — N179 Acute kidney failure, unspecified: Secondary | ICD-10-CM

## 2013-08-04 DIAGNOSIS — M79609 Pain in unspecified limb: Secondary | ICD-10-CM

## 2013-08-04 DIAGNOSIS — Z91199 Patient's noncompliance with other medical treatment and regimen due to unspecified reason: Secondary | ICD-10-CM

## 2013-08-04 DIAGNOSIS — I10 Essential (primary) hypertension: Secondary | ICD-10-CM | POA: Diagnosis present

## 2013-08-04 DIAGNOSIS — F3289 Other specified depressive episodes: Secondary | ICD-10-CM | POA: Diagnosis present

## 2013-08-04 LAB — URINALYSIS, ROUTINE W REFLEX MICROSCOPIC
Bilirubin Urine: NEGATIVE
Glucose, UA: >1000 mg/dL — AB
Ketones, ur: >80 mg/dL — AB
Nitrite: NEGATIVE
Specific Gravity, Urine: 1.038 — ABNORMAL HIGH (ref 1.005–1.030)
pH: 5 (ref 5.0–8.0)

## 2013-08-04 LAB — GLUCOSE, CAPILLARY
Glucose-Capillary: 454 mg/dL — ABNORMAL HIGH (ref 70–99)
Glucose-Capillary: 343 mg/dL — ABNORMAL HIGH (ref 70–99)
Glucose-Capillary: 245 mg/dL — ABNORMAL HIGH (ref 70–99)
Glucose-Capillary: 211 mg/dL — ABNORMAL HIGH (ref 70–99)
Glucose-Capillary: 131 mg/dL — ABNORMAL HIGH (ref 70–99)
Glucose-Capillary: 124 mg/dL — ABNORMAL HIGH (ref 70–99)

## 2013-08-04 LAB — BLOOD GAS, VENOUS
FIO2: 0.21 %
Patient temperature: 98.6
TCO2: 15.5 mmol/L (ref 0–100)
pCO2, Ven: 38.2 mmHg — ABNORMAL LOW (ref 45.0–50.0)
pH, Ven: 7.275 (ref 7.250–7.300)

## 2013-08-04 LAB — CBC WITH DIFFERENTIAL/PLATELET
Basophils Relative: 0 % (ref 0–1)
Hemoglobin: 15.2 g/dL — ABNORMAL HIGH (ref 12.0–15.0)
Lymphocytes Relative: 14 % (ref 12–46)
Lymphs Abs: 1 10*3/uL (ref 0.7–4.0)
MCV: 90.7 fL (ref 78.0–100.0)
Monocytes Absolute: 0.6 10*3/uL (ref 0.1–1.0)
Monocytes Relative: 8 % (ref 3–12)
Neutro Abs: 5.2 10*3/uL (ref 1.7–7.7)
Neutrophils Relative %: 76 % (ref 43–77)
RBC: 5.06 MIL/uL (ref 3.87–5.11)
WBC: 6.9 10*3/uL (ref 4.0–10.5)

## 2013-08-04 LAB — BASIC METABOLIC PANEL
BUN: 30 mg/dL — ABNORMAL HIGH (ref 6–23)
BUN: 29 mg/dL — ABNORMAL HIGH (ref 6–23)
BUN: 28 mg/dL — ABNORMAL HIGH (ref 6–23)
CO2: 16 mEq/L — ABNORMAL LOW (ref 19–32)
CO2: 18 mEq/L — ABNORMAL LOW (ref 19–32)
CO2: 20 mEq/L (ref 19–32)
Calcium: 10.6 mg/dL — ABNORMAL HIGH (ref 8.4–10.5)
Calcium: 10.7 mg/dL — ABNORMAL HIGH (ref 8.4–10.5)
Calcium: 10.4 mg/dL (ref 8.4–10.5)
Chloride: 89 mEq/L — ABNORMAL LOW (ref 96–112)
Chloride: 97 mEq/L (ref 96–112)
Chloride: 100 mEq/L (ref 96–112)
Chloride: 101 mEq/L (ref 96–112)
Creatinine, Ser: 1.19 mg/dL — ABNORMAL HIGH (ref 0.50–1.10)
Creatinine, Ser: 1.07 mg/dL (ref 0.50–1.10)
Creatinine, Ser: 0.96 mg/dL (ref 0.50–1.10)
Creatinine, Ser: 0.9 mg/dL (ref 0.50–1.10)
GFR calc Af Amer: 62 mL/min — ABNORMAL LOW (ref >90)
GFR calc Af Amer: 71 mL/min — ABNORMAL LOW (ref >90)
GFR calc Af Amer: 73 mL/min — ABNORMAL LOW (ref >90)
GFR calc Af Amer: 81 mL/min — ABNORMAL LOW (ref >90)
GFR calc Af Amer: 88 mL/min — ABNORMAL LOW (ref >90)
GFR calc non Af Amer: 54 mL/min — ABNORMAL LOW (ref >90)
GFR calc non Af Amer: 61 mL/min — ABNORMAL LOW (ref >90)
GFR calc non Af Amer: 63 mL/min — ABNORMAL LOW (ref >90)
GFR calc non Af Amer: 70 mL/min — ABNORMAL LOW (ref >90)
GFR calc non Af Amer: 76 mL/min — ABNORMAL LOW (ref >90)
Glucose, Bld: 575 mg/dL (ref 70–99)
Glucose, Bld: 399 mg/dL — ABNORMAL HIGH (ref 70–99)
Glucose, Bld: 356 mg/dL — ABNORMAL HIGH (ref 70–99)
Potassium: 5 mEq/L (ref 3.5–5.1)
Potassium: 4.6 mEq/L (ref 3.5–5.1)
Sodium: 135 mEq/L (ref 135–145)
Sodium: 135 mEq/L (ref 135–145)
Sodium: 135 mEq/L (ref 135–145)

## 2013-08-04 LAB — RAPID URINE DRUG SCREEN, HOSP PERFORMED
Barbiturates: NOT DETECTED
Benzodiazepines: NOT DETECTED

## 2013-08-04 LAB — C-PEPTIDE: C-Peptide: <0.1 ng/mL — ABNORMAL LOW (ref 0.80–3.90)

## 2013-08-04 LAB — URINE MICROSCOPIC-ADD ON

## 2013-08-04 LAB — TROPONIN I: Troponin I: <0.3 ng/mL (ref <0.30)

## 2013-08-04 LAB — MRSA PCR SCREENING: MRSA by PCR: POSITIVE — AB

## 2013-08-04 MED ORDER — SODIUM CHLORIDE 0.9 % IV SOLN: INTRAVENOUS | Status: DC

## 2013-08-04 MED ORDER — FAMOTIDINE 20 MG PO TABS
20.0000 mg | ORAL_TABLET | Freq: Every day | ORAL | Status: DC
  Administered 2013-08-04 – 2013-08-06 (×3): 20 mg via ORAL
  Filled 2013-08-04 (×3): qty 1

## 2013-08-04 MED ORDER — SODIUM CHLORIDE 0.9 % IV SOLN
INTRAVENOUS | Status: DC
  Administered 2013-08-04: 2.8 [IU]/h via INTRAVENOUS
  Filled 2013-08-04: qty 1

## 2013-08-04 MED ORDER — POTASSIUM CHLORIDE 10 MEQ/100ML IV SOLN
10.0000 meq | INTRAVENOUS | Status: AC
  Administered 2013-08-04: 10 meq via INTRAVENOUS
  Filled 2013-08-04 (×2): qty 100

## 2013-08-04 MED ORDER — SODIUM CHLORIDE 0.9 % IV BOLUS (SEPSIS)
1000.0000 mL | Freq: Once | INTRAVENOUS | Status: AC
  Administered 2013-08-04: 1000 mL via INTRAVENOUS

## 2013-08-04 MED ORDER — SODIUM CHLORIDE 0.9 % IV SOLN
INTRAVENOUS | Status: DC
  Administered 2013-08-04: 13:00:00 via INTRAVENOUS

## 2013-08-04 MED ORDER — CHLORHEXIDINE GLUCONATE CLOTH 2 % EX PADS
6.0000 | MEDICATED_PAD | Freq: Every day | CUTANEOUS | Status: DC
  Administered 2013-08-05 – 2013-08-06 (×2): 6 via TOPICAL

## 2013-08-04 MED ORDER — MYCOPHENOLATE MOFETIL 250 MG PO CAPS
500.0000 mg | ORAL_CAPSULE | Freq: Two times a day (BID) | ORAL | Status: DC
  Filled 2013-08-04 (×2): qty 2

## 2013-08-04 MED ORDER — TACROLIMUS 1 MG PO CAPS
1.0000 mg | ORAL_CAPSULE | Freq: Two times a day (BID) | ORAL | Status: DC
  Administered 2013-08-04 – 2013-08-06 (×4): 1 mg via ORAL
  Filled 2013-08-04 (×5): qty 1

## 2013-08-04 MED ORDER — AMLODIPINE BESYLATE 5 MG PO TABS
5.0000 mg | ORAL_TABLET | Freq: Every day | ORAL | Status: DC
  Administered 2013-08-04 – 2013-08-06 (×3): 5 mg via ORAL
  Filled 2013-08-04 (×3): qty 1

## 2013-08-04 MED ORDER — PREDNISONE 5 MG PO TABS
5.0000 mg | ORAL_TABLET | Freq: Every day | ORAL | Status: DC
  Administered 2013-08-05 – 2013-08-06 (×2): 5 mg via ORAL
  Filled 2013-08-04 (×2): qty 1

## 2013-08-04 MED ORDER — LEVOTHYROXINE SODIUM 100 MCG PO TABS
100.0000 ug | ORAL_TABLET | Freq: Every day | ORAL | Status: DC
  Filled 2013-08-04: qty 1

## 2013-08-04 MED ORDER — DEXTROSE 50 % IV SOLN: 25.0000 mL | INTRAVENOUS | Status: DC | PRN

## 2013-08-04 MED ORDER — LEVOTHYROXINE SODIUM 137 MCG PO TABS
137.0000 ug | ORAL_TABLET | Freq: Every day | ORAL | Status: DC
  Filled 2013-08-04: qty 1

## 2013-08-04 MED ORDER — CARVEDILOL 25 MG PO TABS
50.0000 mg | ORAL_TABLET | Freq: Two times a day (BID) | ORAL | Status: DC
  Administered 2013-08-04 – 2013-08-06 (×5): 50 mg via ORAL
  Filled 2013-08-04 (×6): qty 2

## 2013-08-04 MED ORDER — INSULIN ASPART 100 UNIT/ML ~~LOC~~ SOLN
8.0000 [IU] | Freq: Once | SUBCUTANEOUS | Status: AC
  Administered 2013-08-04: 8 [IU] via SUBCUTANEOUS
  Filled 2013-08-04: qty 1

## 2013-08-04 MED ORDER — DEXTROSE-NACL 5-0.45 % IV SOLN
INTRAVENOUS | Status: DC
  Administered 2013-08-04: 16:00:00 via INTRAVENOUS

## 2013-08-04 MED ORDER — LEVOTHYROXINE SODIUM 75 MCG PO TABS
37.5000 ug | ORAL_TABLET | Freq: Every day | ORAL | Status: DC
  Administered 2013-08-05 – 2013-08-06 (×2): 37.5 ug via ORAL
  Filled 2013-08-04 (×3): qty 0.5

## 2013-08-04 MED ORDER — INSULIN ASPART PROT & ASPART (70-30 MIX) 100 UNIT/ML ~~LOC~~ SUSP
15.0000 [IU] | Freq: Once | SUBCUTANEOUS | Status: AC
  Administered 2013-08-04: 15 [IU] via SUBCUTANEOUS
  Filled 2013-08-04: qty 10

## 2013-08-04 MED ORDER — LEVOTHYROXINE SODIUM 75 MCG PO TABS
37.5000 ug | ORAL_TABLET | Freq: Every day | ORAL | Status: DC
  Filled 2013-08-04: qty 0.5

## 2013-08-04 MED ORDER — MYCOPHENOLATE MOFETIL 250 MG PO CAPS
500.0000 mg | ORAL_CAPSULE | Freq: Two times a day (BID) | ORAL | Status: DC
  Administered 2013-08-04 – 2013-08-06 (×4): 500 mg via ORAL
  Filled 2013-08-04 (×5): qty 2

## 2013-08-04 MED ORDER — ENOXAPARIN SODIUM 40 MG/0.4ML ~~LOC~~ SOLN
40.0000 mg | SUBCUTANEOUS | Status: DC
  Administered 2013-08-04 – 2013-08-05 (×2): 40 mg via SUBCUTANEOUS
  Filled 2013-08-04 (×3): qty 0.4

## 2013-08-04 MED ORDER — LEVOTHYROXINE SODIUM 100 MCG PO TABS
100.0000 ug | ORAL_TABLET | Freq: Every day | ORAL | Status: DC
  Administered 2013-08-05 – 2013-08-06 (×2): 100 ug via ORAL
  Filled 2013-08-04 (×3): qty 1

## 2013-08-04 MED ORDER — ACETAMINOPHEN 325 MG PO TABS
650.0000 mg | ORAL_TABLET | Freq: Four times a day (QID) | ORAL | Status: DC | PRN
  Administered 2013-08-04 – 2013-08-05 (×2): 650 mg via ORAL
  Filled 2013-08-04 (×2): qty 2

## 2013-08-04 MED ORDER — MUPIROCIN 2 % EX OINT
1.0000 "application " | TOPICAL_OINTMENT | Freq: Two times a day (BID) | CUTANEOUS | Status: DC
  Administered 2013-08-04 – 2013-08-06 (×4): 1 via NASAL
  Filled 2013-08-04: qty 22

## 2013-08-04 MED ORDER — ADULT MULTIVITAMIN W/MINERALS CH
1.0000 | ORAL_TABLET | Freq: Every day | ORAL | Status: DC
  Administered 2013-08-04 – 2013-08-06 (×3): 1 via ORAL
  Filled 2013-08-04 (×3): qty 1

## 2013-08-04 NOTE — Progress Notes (Signed)
INITIAL NUTRITION ASSESSMENT  Pt meets criteria for severe MALNUTRITION in the context of chronic illness as evidenced by 5% weight loss in the past month in addition to pt with severe muscle wasting in upper arms.  DOCUMENTATION CODES Per approved criteria  -Severe malnutrition in the context of chronic illness -Obesity Unspecified   INTERVENTION: - Diet advancement per MD, recommend Glucerna shakes BID when diet advanced - Educated pt on diabetic diet - Will continue to monitor   NUTRITION DIAGNOSIS: Inadequate oral intake related to inability to eat as evidenced by NPO.    Goal: Advance diet as tolerated to diabetic diet  Monitor:  Weights, labs, diet advancement  Reason for Assessment: Malnutrition screening tool   47 y.o. female  Admitting Dx: Hyperglycemia with nausea/vomiting   ASSESSMENT: 47 year old female with a history of type I DM, renal transplant, hypertension, and hypothyroidism presented to the emergency department with 2-3 day history of hyperglycemia with nausea and vomiting. The patient was discharged from the hospital on 06/09/2013 with an episode of DKA. The patient states that she is having difficulty affording her 70/30 insulin. Even at [facility], she is having difficulty affording the $25 per vial. As a result, the patient has not been using her insulin regularly. There are many days where she only uses her insulin once daily or not at all to make a last longer. The patient states that she last used insulin over 24 hours ago. She states that her sugars have been running 430-510 mg/dL at home over the past 2-3 days.   Met with pt who reports poor appetite for several months. Reports trying to follow a diabetic diet and eats mostly vegetables. States she usually eats 2 meals/day and has a snack, denies skipping any meals. Drinks only diet drinks. Weight has gone from 223 pounds to 212 pounds in the past month. Pt c/o being weak.   Lab Results  Component Value  Date   HGBA1C 12.2* 06/05/2013    Nutrition Focused Physical Exam:  Subcutaneous Fat:  Orbital Region: WNL Upper Arm Region: severe muscle wasting  Muscle:  Temple Region: WNL Clavicle Bone Region: WNL Clavicle and Acromion Bone Region: WNL Dorsal Hand: WNL   Edema: None noted     Height: Ht Readings from Last 1 Encounters:  08/04/13 5\' 9"  (1.753 m)    Weight: Wt Readings from Last 1 Encounters:  08/04/13 212 lb 1.3 oz (96.2 kg)    Ideal Body Weight: 145 lb   % Ideal Body Weight: 146%  Wt Readings from Last 10 Encounters:  08/04/13 212 lb 1.3 oz (96.2 kg)  06/07/13 223 lb (101.152 kg)  03/07/13 240 lb 11.9 oz (109.2 kg)  01/06/13 236 lb 5.3 oz (107.2 kg)  10/24/12 242 lb (109.77 kg)  08/08/12 243 lb 4.8 oz (110.36 kg)  07/04/12 244 lb (110.678 kg)  02/05/12 243 lb 11.2 oz (110.542 kg)  11/20/11 260 lb (117.935 kg)  09/23/11 257 lb 15 oz (117 kg)    Usual Body Weight: 240 lb in July 2014  % Usual Body Weight: 88%  BMI:  Body mass index is 31.3 kg/(m^2). Class I obesity  Estimated Nutritional Needs: Kcal: 1650-1800 Protein: 80-90g Fluid: 1.6-1.8L/day  Skin: Missing 1st, 2nd, and 3rd right toes   Diet Order: NPO  EDUCATION NEEDS: -Education needs addressed - briefly reviewed diabetic diet, provided handouts with RD contact information    Intake/Output Summary (Last 24 hours) at 08/04/13 1601 Last data filed at 08/04/13 1533  Gross per 24  hour  Intake    575 ml  Output      0 ml  Net    575 ml    Last BM: 11/28  Labs:   Recent Labs Lab 08/04/13 0950 08/04/13 1245 08/04/13 1441  NA 129* 134* 135  K 5.0 4.6 4.6  CL 89* 97 97  CO2 16* 18* 18*  BUN 30* 29* 29*  CREATININE 1.19* 1.07 1.05  CALCIUM 10.9* 10.6* 10.7*  GLUCOSE 575* 399* 356*    CBG (last 3)   Recent Labs  08/04/13 1114 08/04/13 1309 08/04/13 1422  GLUCAP 431* 343* 339*    Scheduled Meds: . sodium chloride   Intravenous STAT  . amLODipine  5 mg Oral Daily   . carvedilol  50 mg Oral BID WC  . enoxaparin (LOVENOX) injection  40 mg Subcutaneous Q24H  . famotidine  20 mg Oral Daily  . [START ON 08/05/2013] levothyroxine  100 mcg Oral QAC breakfast   And  . [START ON 08/05/2013] levothyroxine  37.5 mcg Oral QAC breakfast  . multivitamin with minerals  1 tablet Oral Daily  . mycophenolate  500 mg Oral BID  . [START ON 08/05/2013] predniSONE  5 mg Oral Daily  . tacrolimus  1 mg Oral BID    Continuous Infusions: . sodium chloride 125 mL/hr at 08/04/13 1321  . sodium chloride    . dextrose 5 % and 0.45% NaCl 125 mL/hr at 08/04/13 1533  . insulin (NOVOLIN-R) infusion 3.7 mL/hr at 08/04/13 1500    Past Medical History  Diagnosis Date  . Hypertension   . Hypothyroid   . Depression   . 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 Kidney disease   . End stage renal disease     right arm AV graft, post transplant  . Diabetes mellitus     Insulin dependant    Past Surgical History  Procedure Laterality Date  . Back surgery  2001  . Tubal ligation    . Dg av dialysis graft declot or    . Nephrectomy transplanted organ    . Kidney transplant  December 16, 2009  . 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  . Cataract extraction w/ intraocular lens implant Right     Levon Hedger MS, RD, LDN [phone] Pager [phone] After Hours Pager

## 2013-08-04 NOTE — H&P (Signed)
[facility] History and Physical  [name]:[id number] DOB: 1965-09-12 DOA: 08/04/2013   PCP: [name], MD   Chief Complaint: hyperglycemia with n/v   HPI:  47 year old female with a history of  type I DM, renal transplant, hypertension, and hypothyroidism presented to the emergency department with 2-3 day history of hyperglycemia with nausea and vomiting. The patient was discharged from the hospital on 06/09/2013 with an episode of DKA. The patient states that she is having difficulty affording her 70/30 insulin.  Even at [facility], she is having difficulty affording the $25 per vial. As a result, the patient has not been using her insulin regularly. There are many days where she only uses her insulin once daily or not at all to make a last longer. The patient states that she last used insulin over 24 hours ago. She states that her sugars have been running 430-510 at home over the past 2-3 days. In addition to her nausea and vomiting, the patient has been having some dizziness which was worse today. She denies any syncope, or falls..She denies any fevers, chills, chest discomfort, dysuria, hematuria. She does complain of some dyspnea on exertion. She denies any coughing, hemoptysis, hematochezia, melena, headaches. She endorses compliance with her immunosuppressive medications for her renal transplant.  In the emergency department, the patient was noted to have serum glucose of 575, sodium 139, CO2 of 16 with anion gap of 24. WBC was 6.9. The patient was given 8 units of NovoLog [place] and 15 units of 70/30 by EDP prior to my arrival. The patient was also given a liter of normal saline and then started on a drip. Assessment/Plan: Diabetic ketoacidosis -Start patient on IV insulin drip -Please note the patient was given [place] insulin in ED prior to my arrival -The patient may need to be started on D5 earlier while she is on her IV insulin drip -Once her anion gap closes, she can  be continued on her [place] insulin -Patient normally takes 70/30, 15 units bid -Please note that it was recommended that the patient start on 70/30--50 units bid, but pt became hypoglycemic last admission which delayed her d/c--therefore, her insulin was decreased significantly -BMP every 2 hrs until gap closes IDDM -Poorly controlled -Check C-peptide -check anti-insulin antibody, anti-GAD antibody -06/05/2013 HbA1c--12.2 -Case manager in ED has provided the patient information and contact numbers for medication assistance for her insulin Hyponatremia  -pseudohyponatremia due to hyperglcemia  S/p Renal Transplant  -Continue immune suppressants  -f/u with Dr. Arrie Aran.  Hypothyroidism  -Continue synthroid.  -TSH 2.194  HTN -continue coreg and amlodipine Dyspnea on exertion -not clear if this is partly due to compensation from her metabolic acidosis -cycle troponins -EKG -CXR Right lower extremity pain and edema -Venous duplex to rule out DVT       Past Medical History  Diagnosis Date  . Hypertension   . Hypothyroid   . Depression   . 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 Kidney disease   . End stage renal disease     right arm AV graft, post transplant  . Diabetes mellitus     Insulin dependant   Past Surgical History  Procedure Laterality Date  . Back surgery  2001  . Tubal ligation    . Dg av dialysis graft declot or    . Nephrectomy transplanted organ    . Kidney transplant  December 16, 2009  . 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  [name]  Cataract extraction w/ intraocular lens implant Right    Social History:  reports that she quit smoking about 14 years ago. Her smoking use included Cigarettes. She has a 11 pack-year smoking history. She has never used smokeless tobacco. She reports that she does not drink alcohol or use illicit drugs.   Family History  Problem Relation Age of Onset  . Emphysema Mother   . Throat cancer Mother   . COPD Mother    . Cancer Mother   . Emphysema Father   . COPD Father   . Stroke Father   . ADD / ADHD Son      Allergies  Allergen Reactions  . Ace Inhibitors Other (See Comments)    Cough   ( Lisinopril )  . Adhesive [Tape] Itching      Prior to Admission medications   Medication Sig Start Date End Date Taking? Authorizing Provider  acetaminophen (TYLENOL) 500 MG tablet Take 500 mg by mouth every 6 (six) hours as needed.   Yes Historical Provider, MD  amLODipine (NORVASC) 5 MG tablet Take 1 tablet (5 mg total) by mouth daily. 08/08/12  Yes Alison Murray, MD  carvedilol (COREG) 25 MG tablet Take 2 tablets (50 mg total) by mouth 2 (two) times daily. 08/08/12  Yes Alison Murray, MD  insulin aspart protamine- aspart (NOVOLOG MIX 70/30) (70-30) 100 UNIT/ML injection Inject 0.15 mLs (15 Units total) into the skin 2 (two) times daily with a meal. 06/09/13  Yes Catarina Hartshorn, MD  levothyroxine (SYNTHROID, LEVOTHROID) 137 MCG tablet Take 137 mcg by mouth daily. Name brand   Yes Historical Provider, MD  Multiple Vitamin (MULTIVITAMIN WITH MINERALS) TABS tablet Take 1 tablet by mouth daily.   Yes Historical Provider, MD  mycophenolate (CELLCEPT) 250 MG capsule Take 500 mg by mouth 2 (two) times daily.    Yes Historical Provider, MD  predniSONE (DELTASONE) 5 MG tablet Take 5 mg by mouth daily.     Yes Historical Provider, MD  ranitidine (ZANTAC) 150 MG capsule Take 150 mg by mouth daily.    Yes Historical Provider, MD  tacrolimus (PROGRAF) 1 MG capsule Take 1 mg by mouth 2 (two) times daily.    Yes Historical Provider, MD    Review of Systems:  Constitutional:  No weight loss, night sweats, Fevers, chills Head&Eyes: No headache.  No vision loss.  No eye pain or scotoma ENT:  No Difficulty swallowing,Tooth/dental problems,Sore throat,    Cardio-vascular:  No chest pain, Orthopnea, PND, swelling in lower extremities, palpitations  GI:  No  abdominal pain, nausea, vomiting, diarrhea, loss of appetite,  hematochezia, melena, heartburn, indigestion, Resp:  No cough. No coughing up of blood .No wheezing.No chest wall deformity  Skin:  no rash or lesions.  GU:  no dysuria, change in color of urine, no urgency or frequency. C/o polyuria and polydipsia Musculoskeletal:  No joint pain or swelling. No decreased range of motion. No back pain.  Psych:  No change in mood or affect. No depression or anxiety. Neurologic: No headache, no dysesthesia, no focal weakness, no vision loss. No syncope  Physical Exam: Filed Vitals:   08/04/13 0902  BP: 144/72  Pulse: 72  Temp: 97.7 F (36.5 C)  TempSrc: Oral  Resp: 16  SpO2: 97%   General:  A&O x 3, NAD, nontoxic, pleasant/cooperative Head/Eye: No conjunctival hemorrhage, no icterus, [place]/AT, No nystagmus ENT:  No icterus,  No thrush, poor dentition, no pharyngeal exudate Neck:  No masses, no lymphadenpathy, CV:  RRR, no rub, no gallop, no S3 Lung:  CTAB, good air movement, no wheeze, no rhonchi Abdomen: soft/NT, +BS, nondistended, no peritoneal signs Ext: No cyanosis, No rashes, No petechiae, No lymphangitis, trace LE edema, R>L;  bilateral feet and lower extremities without any open wounds or cellulitis.   Labs on Admission:  Basic Metabolic Panel:  Recent Labs Lab 08/04/13 0950  NA 129*  K 5.0  CL 89*  CO2 16*  GLUCOSE 575*  BUN 30*  CREATININE 1.19*  CALCIUM 10.9*   Liver Function Tests: No results found for this basename: AST, ALT, ALKPHOS, BILITOT, PROT, ALBUMIN,  in the last 168 hours No results found for this basename: LIPASE, AMYLASE,  in the last 168 hours No results found for this basename: AMMONIA,  in the last 168 hours CBC:  Recent Labs Lab 08/04/13 0950  WBC 6.9  NEUTROABS 5.2  HGB 15.2*  HCT 45.9  MCV 90.7  PLT 190   Cardiac Enzymes: No results found for this basename: CKTOTAL, CKMB, CKMBINDEX, TROPONINI,  in the last 168 hours BNP: No components found with this basename: POCBNP,  CBG:  Recent  Labs Lab 08/04/13 0906 08/04/13 1114  GLUCAP 454* 431*    Radiological Exams on Admission: No results found.  EKG: Independently reviewed. pending    Time spent:70 minutes Code Status:   FULL Family Communication:   Family at bedside   Guss Farruggia, DO  [facility] Pager [phone]  If 7PM-7AM, please contact night-coverage [email or web address] Password [facility] 08/04/2013, 12:34 PM

## 2013-08-04 NOTE — Progress Notes (Signed)
   CARE MANAGEMENT ED NOTE 08/04/2013  Patient:  [name], [name]   Account Number:  [id number]  Date Initiated:  08/04/2013  Documentation initiated by:  Edd Arbour  Subjective/Objective Assessment:   47 yr old female medicare pt CM consult for pt not being able to afford her Humulin 70/30 insulin per Admission RN referral  pcp joseph a coladonato Pt states she is not sure if she is in the "donut hole" In ED glucose 454, 575, 431     Subjective/Objective Assessment Detail:   NA 129 BUN 30 Creat 1.19 UA  sp gravity 1.038 ketones >0 glucose >1000 Pt given a 75 % coupon discount from online to assist her with cost of humulin 70/30  Admitting MD came to evaluate pt upon completion of CM assessment and CM informed him of pt's voiced concern with cost of Humulin 70/30  reports home cbg 505 Given NS bolus & novolog 70/30 15 units, novolog 8 units both sq     Action/Plan:   CM spoke with Admisison RN, CM reviewed EPIC and spoke with pt who confirms pcp, CM reviewed "donut hole" issues Pt voiced understanding.   Action/Plan Detail:   CM provided pt with [employer] [facility] Pt assistance program [address] [place] [address] [phone] fax [phone] [email or web address] with instructions on how to get medications sent to pcp   Anticipated DC Date:  08/07/2013     Status Recommendation to Physician:   Result of Recommendation:    Other ED Services  Consult Working Plan    DC Planning Services  Other  Medication Assistance  Outpatient Services - Pt will follow up    Choice offered to / List presented to:            Status of service:  Completed, signed off  ED Comments:   ED Comments Detail:

## 2013-08-04 NOTE — ED Notes (Signed)
Pt from home w/ CBG of 505.  Decided not to treat it at home and instead called 911 to come to hospital.

## 2013-08-04 NOTE — Progress Notes (Signed)
Right lower extremity venous duplex completed.  Right:  No evidence of DVT, superficial thrombosis, or Baker's cyst.  Left:  Negative for DVT in the common femoral vein.  

## 2013-08-04 NOTE — ED Notes (Signed)
Patient transported to X-ray 

## 2013-08-04 NOTE — Progress Notes (Signed)
UR completed 

## 2013-08-04 NOTE — ED Provider Notes (Signed)
CSN: [id number]     Arrival date & time 08/04/13  0900 History   First MD Initiated Contact with Patient 08/04/13 [id number]     Chief Complaint  Patient presents with  . Hyperglycemia   (Consider location/radiation/quality/duration/timing/severity/associated sxs/prior Treatment) Patient is a 47 y.o. female presenting with hyperglycemia. The history is provided by the patient.  Hyperglycemia  patient here complaining of hyperglycemia x24 hours. Denies any vomiting, fever, chills. Has not been compliant with her diabetic diet. Notes polyuria and polydipsia. Has a mild headache without visual changes. Similar symptoms in the past associated with DKA. Has not taken her normal morning dose of insulin. Called EMS and was transported here  Past Medical History  Diagnosis Date  . Hypertension   . Hypothyroid   . Depression   . 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 Kidney disease   . End stage renal disease     right arm AV graft, post transplant  . Diabetes mellitus     Insulin dependant   Past Surgical History  Procedure Laterality Date  . Back surgery  2001  . Tubal ligation    . Dg av dialysis graft declot or    . Nephrectomy transplanted organ    . Kidney transplant  December 16, 2009  . 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  . Cataract extraction w/ intraocular lens implant Right    Family History  Problem Relation Age of Onset  . Emphysema Mother   . Throat cancer Mother   . COPD Mother   . Cancer Mother   . Emphysema Father   . COPD Father   . Stroke Father   . ADD / ADHD Son    History  Substance Use Topics  . Smoking status: Former Smoker -- 1.00 packs/day for 11 years    Types: Cigarettes    Quit date: 09/21/1998  . Smokeless tobacco: Never Used  . Alcohol Use: No   OB History   Grav Para Term Preterm Abortions TAB SAB Ect Mult Living                 Review of Systems  All other systems reviewed and are negative.    Allergies  Ace inhibitors  and Adhesive  Home Medications   Current Outpatient Rx  Name  Route  Sig  Dispense  Refill  . amLODipine (NORVASC) 5 MG tablet   Oral   Take 1 tablet (5 mg total) by mouth daily.   30 tablet   0   . carvedilol (COREG) 25 MG tablet   Oral   Take 2 tablets (50 mg total) by mouth 2 (two) times daily.   60 tablet   0   . insulin aspart protamine- aspart (NOVOLOG MIX 70/30) (70-30) 100 UNIT/ML injection   Subcutaneous   Inject 0.15 mLs (15 Units total) into the skin 2 (two) times daily with a meal.   10 mL   2   . levothyroxine (SYNTHROID, LEVOTHROID) 137 MCG tablet   Oral   Take 137 mcg by mouth daily. Name brand         . Multiple Vitamin (MULTIVITAMIN WITH MINERALS) TABS tablet   Oral   Take 1 tablet by mouth daily.         . mycophenolate (CELLCEPT) 250 MG capsule   Oral   Take 500 mg by mouth 2 (two) times daily.          . predniSONE (DELTASONE) 10 MG  tablet   Oral   Take 1 tablet (10 mg total) by mouth daily with breakfast.   6 tablet   0   . predniSONE (DELTASONE) 5 MG tablet   Oral   Take 5 mg by mouth daily.           . ranitidine (ZANTAC) 150 MG capsule   Oral   Take 150 mg by mouth at bedtime.          . tacrolimus (PROGRAF) 1 MG capsule   Oral   Take 1 mg by mouth 2 (two) times daily.           BP 144/72  Pulse 72  Temp(Src) 97.7 F (36.5 C) (Oral)  Resp 16  SpO2 97%  LMP 11/19/2011 Physical Exam  Nursing note and vitals reviewed. Constitutional: She is oriented to person, place, and time. She appears well-developed and well-nourished.  Non-toxic appearance. No distress.  HENT:  Head: Normocephalic and atraumatic.  Eyes: Conjunctivae, EOM and lids are normal. Pupils are equal, round, and reactive to light.  Neck: Normal range of motion. Neck supple. No tracheal deviation present. No mass present.  Cardiovascular: Normal rate, regular rhythm and normal heart sounds.  Exam reveals no gallop.   No murmur heard. Pulmonary/Chest:  Effort normal and breath sounds normal. No stridor. No respiratory distress. She has no decreased breath sounds. She has no wheezes. She has no rhonchi. She has no rales.  Abdominal: Soft. Normal appearance and bowel sounds are normal. She exhibits no distension. There is no tenderness. There is no rebound and no CVA tenderness.  Musculoskeletal: Normal range of motion. She exhibits no edema and no tenderness.  Neurological: She is alert and oriented to person, place, and time. She has normal strength. No cranial nerve deficit or sensory deficit. GCS eye subscore is 4. GCS verbal subscore is 5. GCS motor subscore is 6.  Skin: Skin is warm and dry. No abrasion and no rash noted.  Psychiatric: She has a normal mood and affect. Her speech is normal and behavior is normal.    ED Course  Procedures (including critical care time) Labs Review Labs Reviewed  GLUCOSE, CAPILLARY - Abnormal; Notable for the following:    Glucose-Capillary 454 (*)    All other components within normal limits  CBC WITH DIFFERENTIAL  BASIC METABOLIC PANEL  URINALYSIS, ROUTINE W REFLEX MICROSCOPIC  BLOOD GAS, VENOUS   Imaging Review No results found.  EKG Interpretation   None       MDM  No diagnosis found. Patient given IV fluids along with insulin. Will be admitted to [facility]    Toy Baker, MD 08/04/13 1159

## 2013-08-05 DIAGNOSIS — E119 Type 2 diabetes mellitus without complications: Secondary | ICD-10-CM

## 2013-08-05 DIAGNOSIS — E039 Hypothyroidism, unspecified: Secondary | ICD-10-CM

## 2013-08-05 DIAGNOSIS — E1010 Type 1 diabetes mellitus with ketoacidosis without coma: Principal | ICD-10-CM

## 2013-08-05 DIAGNOSIS — Z940 Kidney transplant status: Secondary | ICD-10-CM

## 2013-08-05 LAB — BASIC METABOLIC PANEL
BUN: 21 mg/dL (ref 6–23)
Calcium: 10.1 mg/dL (ref 8.4–10.5)
Chloride: 99 mEq/L (ref 96–112)
Creatinine, Ser: 0.77 mg/dL (ref 0.50–1.10)
GFR calc Af Amer: >90 mL/min (ref >90)
GFR calc non Af Amer: >90 mL/min (ref >90)
Glucose, Bld: 150 mg/dL — ABNORMAL HIGH (ref 70–99)
Potassium: 4 mEq/L (ref 3.5–5.1)

## 2013-08-05 LAB — GLUCOSE, CAPILLARY
Glucose-Capillary: 163 mg/dL — ABNORMAL HIGH (ref 70–99)
Glucose-Capillary: 180 mg/dL — ABNORMAL HIGH (ref 70–99)
Glucose-Capillary: 183 mg/dL — ABNORMAL HIGH (ref 70–99)
Glucose-Capillary: 245 mg/dL — ABNORMAL HIGH (ref 70–99)

## 2013-08-05 LAB — CLOSTRIDIUM DIFFICILE BY PCR: Toxigenic C. Difficile by PCR: NEGATIVE

## 2013-08-05 MED ORDER — INSULIN ASPART PROT & ASPART (70-30 MIX) 100 UNIT/ML ~~LOC~~ SUSP
15.0000 [IU] | Freq: Two times a day (BID) | SUBCUTANEOUS | Status: DC
  Administered 2013-08-05 (×3): 15 [IU] via SUBCUTANEOUS
  Filled 2013-08-05: qty 10

## 2013-08-05 MED ORDER — INSULIN ASPART 100 UNIT/ML ~~LOC~~ SOLN
0.0000 [IU] | Freq: Three times a day (TID) | SUBCUTANEOUS | Status: DC
  Administered 2013-08-05: 7 [IU] via SUBCUTANEOUS
  Administered 2013-08-05: 3 [IU] via SUBCUTANEOUS
  Administered 2013-08-05: 1 [IU] via SUBCUTANEOUS

## 2013-08-05 MED ORDER — SODIUM CHLORIDE 0.9 % IV SOLN
INTRAVENOUS | Status: DC
  Administered 2013-08-05: 50 mL/h via INTRAVENOUS
  Administered 2013-08-06: 04:00:00 via INTRAVENOUS

## 2013-08-05 MED ORDER — INSULIN ASPART 100 UNIT/ML ~~LOC~~ SOLN
0.0000 [IU] | Freq: Every day | SUBCUTANEOUS | Status: DC
  Administered 2013-08-05: 3 [IU] via SUBCUTANEOUS

## 2013-08-05 NOTE — Progress Notes (Signed)
[facility] PROGRESS NOTE  [name] MVH:[id number] DOB: 06-01-1966 DOA: 08/04/2013 PCP: [name], MD  Assessment/Plan: Diabetic ketoacidosis  - IV insulin drip now transitioned to 70/30 SQ sliding scale coverage monitor and adjust doses as appropriate -Please note the patient was given [place] insulin in ED prior to my arrival  -Continue IV fluids for hydration and follow -Please note that it was recommended that the patient start on 70/30--50 units bid, but pt became hypoglycemic last admission which delayed her d/c--therefore, her insulin was decreased significantly  IDDM  -Poorly controlled-due to noncompliance with meds, see management as above. -06/05/2013 HbA1c--12.2  -Case manager in ED provided the patient information and contact numbers for medication assistance for her insulin  Hyponatremia  -pseudohyponatremia due to hyperglcemia  -Continue management of hyperglycemia as above. S/p Renal Transplant  -Continue immune suppressants  -f/u with Dr. Arrie Aran.  Hypothyroidism  -Continue synthroid.  -TSH 2.194  HTN  -continue coreg and amlodipine  Dyspnea on exertion  -Possibly was due to compensation from her metabolic acidosis, now resolved. -Chest x-ray with no acute infiltrates and troponins negative  Right lower extremity pain and edema  -Venous duplex negative for DVT Diarrhea -Will obtain C. difficile studies, possibly osmotic due to hyperglycemia/long-standing uncontrolled blood sugars -Supportive care pending stool studies and follow.  Code Status: full Family Communication: Husband and son at bedside Disposition Plan: Transfer to medical floor   Consultants:  none  Procedures:  none  Antibiotics:  none  HPI/Subjective: Complaining of diarrhea x4 with lower abdominal discomfort this a.m. Denies nausea or vomiting.  Objective: Filed Vitals:   08/05/13 0800  BP: 126/59  Pulse: 74  Temp:   Resp: 17    Intake/Output Summary  (Last 24 hours) at 08/05/13 0920 Last data filed at 08/05/13 0900  Gross per 24 hour  Intake 3013.8 ml  Output    750 ml  Net 2263.8 ml   Filed Weights   08/04/13 1340 08/05/13 0400  Weight: 96.2 kg (212 lb 1.3 oz) 98.2 kg (216 lb 7.9 oz)    Exam:  General: alert & oriented x 3 In NAD Cardiovascular: RRR, nl S1 s2 Respiratory: CTAB Abdomen: soft +BS NT/ND, no masses palpable Extremities: No cyanosis and no edema    Data Reviewed: Basic Metabolic Panel:  Recent Labs Lab 08/04/13 1245 08/04/13 1441 08/04/13 1636 08/04/13 1834 08/04/13 2343  NA 134* 135 135 135 134*  K 4.6 4.6 4.9 4.4 4.0  CL 97 97 100 101 99  CO2 18* 18* 20 21 21   GLUCOSE 399* 356* 259* 199* 150*  BUN 29* 29* 28* 25* 21  CREATININE 1.07 1.05 0.96 0.90 0.77  CALCIUM 10.6* 10.7* 10.6* 10.4 10.1   Liver Function Tests: No results found for this basename: AST, ALT, ALKPHOS, BILITOT, PROT, ALBUMIN,  in the last 168 hours No results found for this basename: LIPASE, AMYLASE,  in the last 168 hours No results found for this basename: AMMONIA,  in the last 168 hours CBC:  Recent Labs Lab 08/04/13 0950  WBC 6.9  NEUTROABS 5.2  HGB 15.2*  HCT 45.9  MCV 90.7  PLT 190   Cardiac Enzymes:  Recent Labs Lab 08/04/13 1245 08/04/13 1834 08/04/13 2343  TROPONINI <0.30 <0.30 <0.30   BNP (last 3 results) No results found for this basename: PROBNP,  in the last 8760 hours CBG:  Recent Labs Lab 08/04/13 2335 08/05/13 0050 08/05/13 0205 08/05/13 0339 08/05/13 0757  GLUCAP 129* 163* 183* 180* 245*  Recent Results (from the past 240 hour(s))  MRSA PCR SCREENING     Status: Abnormal   Collection Time    08/04/13  1:51 PM      Result Value Range Status   MRSA by PCR POSITIVE (*) NEGATIVE Final   Comment:            The GeneXpert MRSA Assay (FDA     approved for NASAL specimens     only), is one component of a     comprehensive MRSA colonization     surveillance program. It is not      intended to diagnose MRSA     infection nor to guide or     monitor treatment for     MRSA infections.     RESULT CALLED TO, READ BACK BY AND VERIFIED WITH:     Eleonore Chiquito [id number] @ 1830 BY J SCOTTON     Studies: Dg Chest 2 View  08/04/2013   CLINICAL DATA:  Diabetes with ketoacidosis.  EXAM: CHEST  2 VIEW  COMPARISON:  03/04/2013  FINDINGS: The heart size and mediastinal contours are within normal limits. Both lungs are clear. The visualized skeletal structures are unremarkable.  IMPRESSION: Normal exam.   Electronically Signed   By: Kennith Center M.D.   On: 08/04/2013 13:15   Dg Abd 2 Views  08/04/2013   CLINICAL DATA:  Diabetes with ketoacidosis  EXAM: ABDOMEN - 2 VIEW  COMPARISON:  None.  FINDINGS: There is no evidence for gaseous bowel dilation to suggest obstruction. No evidence for intraperitoneal free air. Vascular calcifications seen over both pelvic sidewalls. No unexpected abdominal pelvic calcification. Degenerative changes noted in the lower lumbar spine.  IMPRESSION: Normal bowel gas pattern.   Electronically Signed   By: Kennith Center M.D.   On: 08/04/2013 13:16    Scheduled Meds: . amLODipine  5 mg Oral Daily  . carvedilol  50 mg Oral BID WC  . Chlorhexidine Gluconate Cloth  6 each Topical Q0600  . enoxaparin (LOVENOX) injection  40 mg Subcutaneous Q24H  . famotidine  20 mg Oral Daily  . insulin aspart  0-5 Units Subcutaneous QHS  . insulin aspart  0-9 Units Subcutaneous TID WC  . insulin aspart protamine- aspart  15 Units Subcutaneous BID WC  . levothyroxine  100 mcg Oral QAC breakfast   And  . levothyroxine  37.5 mcg Oral QAC breakfast  . multivitamin with minerals  1 tablet Oral Daily  . mupirocin ointment  1 application Nasal BID  . mycophenolate  500 mg Oral BID  . predniSONE  5 mg Oral Daily  . tacrolimus  1 mg Oral BID   Continuous Infusions: . sodium chloride 50 mL/hr (08/05/13 0503)    Active Problems:   HYPERTENSION   Hyponatremia   H/O  kidney transplant   DKA (diabetic ketoacidoses)   Hyperglycemia    Time spent: 27    [facility] C  [facility] Pager [phone]. If 7PM-7AM, please contact night-coverage at [email or web address], password [facility] 08/05/2013, 9:20 AM  LOS: 1 day

## 2013-08-06 DIAGNOSIS — E1110 Type 2 diabetes mellitus with ketoacidosis without coma: Secondary | ICD-10-CM

## 2013-08-06 LAB — BASIC METABOLIC PANEL
BUN: 13 mg/dL (ref 6–23)
CO2: 22 mEq/L (ref 19–32)
Calcium: 10.1 mg/dL (ref 8.4–10.5)
Chloride: 102 mEq/L (ref 96–112)
Creatinine, Ser: 0.81 mg/dL (ref 0.50–1.10)
Glucose, Bld: 219 mg/dL — ABNORMAL HIGH (ref 70–99)
Sodium: 135 mEq/L (ref 135–145)

## 2013-08-06 LAB — CBC
Hemoglobin: 13.9 g/dL (ref 12.0–15.0)
MCH: 29.4 pg (ref 26.0–34.0)
MCHC: 33 g/dL (ref 30.0–36.0)
MCV: 89.2 fL (ref 78.0–100.0)
RBC: 4.72 MIL/uL (ref 3.87–5.11)

## 2013-08-06 LAB — GLUCOSE, CAPILLARY: Glucose-Capillary: 177 mg/dL — ABNORMAL HIGH (ref 70–99)

## 2013-08-06 MED ORDER — INSULIN ASPART 100 UNIT/ML ~~LOC~~ SOLN
0.0000 [IU] | SUBCUTANEOUS | Status: DC
  Administered 2013-08-06: 3 [IU] via SUBCUTANEOUS
  Administered 2013-08-06: 5 [IU] via SUBCUTANEOUS

## 2013-08-06 MED ORDER — INSULIN ASPART PROT & ASPART (70-30 MIX) 100 UNIT/ML ~~LOC~~ SUSP
20.0000 [IU] | Freq: Two times a day (BID) | SUBCUTANEOUS | Status: DC
  Administered 2013-08-06: 20 [IU] via SUBCUTANEOUS

## 2013-08-06 MED ORDER — INSULIN ASPART PROT & ASPART (70-30 MIX) 100 UNIT/ML ~~LOC~~ SUSP: 20.0000 [IU] | Freq: Two times a day (BID) | SUBCUTANEOUS | Status: DC

## 2013-08-06 MED ORDER — SODIUM CHLORIDE 0.9 % IV SOLN
INTRAVENOUS | Status: DC
  Administered 2013-08-06: 14:00:00 via INTRAVENOUS

## 2013-08-06 NOTE — Progress Notes (Signed)
   CARE MANAGEMENT NOTE 08/06/2013  Patient:  [name], [name]   Account Number:  [id number]  Date Initiated:  08/06/2013  Documentation initiated by:  [facility]  Subjective/Objective Assessment:   DM     Action/Plan:   lives at home with husband   Anticipated DC Date:  08/06/2013   Anticipated DC Plan:  HOME/SELF CARE      DC Planning Services  CM consult  PCP issues      Choice offered to / List presented to:             Status of service:  Completed, signed off Medicare Important Message given?   (If response is "NO", the following Medicare IM given date fields will be blank) Date Medicare IM given:   Date Additional Medicare IM given:    Discharge Disposition:  HOME/SELF CARE  Per UR Regulation:    If discussed at Long Length of Stay Meetings, dates discussed:    Comments:  08/06/2013 1420 Provided pt with brochure for [facility] and Wellness. Educated to call to get an appt within 1-2 weeks. States she will need to call physician referral line to get information on PCP that accept her insurance. States she does not have access to Internet. Pt's insulin is $24.00 at pharmacy. She gets $700 from her disability. States she still had difficulty with paying for insulin. Explained to take application for PAP program with her to the appt for MD to complete. Isidoro Donning RN Case Mgmt phone [phone]

## 2013-08-06 NOTE — Discharge Summary (Signed)
Physician Discharge Summary  [name]:[id number] DOB: October 10, 1965 DOA: 08/04/2013  PCP: [name], MD  Admit date: 08/04/2013 Discharge date: 08/06/2013  Time spent: >30 minutes  Recommendations for Outpatient Follow-up:  Follow-up Information   Follow up with COLADONATO,JOSEPH A, MD. (in 1-2weks, call for appt upon discharge)    Specialty:  Nephrology   Contact information:   [address] [facility] [place] [address] [phone]       Please follow up. (PCP in 1week, call for appt upon discharge)        Discharge Diagnoses:  Active Problems:   HYPERTENSION   Hyponatremia   H/O kidney transplant   DKA (diabetic ketoacidoses)   Hyperglycemia   Discharge Condition: improved/stable  Diet recommendation: modified carb  Filed Weights   08/04/13 1340 08/05/13 0400 08/05/13 1205  Weight: 96.2 kg (212 lb 1.3 oz) 98.2 kg (216 lb 7.9 oz) 98.431 kg (217 lb)    History of present illness:  47 year old female with a history of type I DM, renal transplant, hypertension, and hypothyroidism presented to the emergency department with 2-3 day history of hyperglycemia with nausea and vomiting. The patient was discharged from the hospital on 06/09/2013 with an episode of DKA. The patient states that she is having difficulty affording her 70/30 insulin. Even at [facility], she is having difficulty affording the $25 per vial. As a result, the patient has not been using her insulin regularly. There are many days where she only uses her insulin once daily or not at all to make a last longer. The patient states that she last used insulin over 24 hours ago. She states that her sugars have been running 430-510 at home over the past 2-3 days. In addition to her nausea and vomiting, the patient has been having some dizziness which was worse today. She denies any syncope, or falls..She denies any fevers, chills, chest discomfort, dysuria, hematuria. She does  complain of some dyspnea on exertion. She denies any coughing, hemoptysis, hematochezia, melena, headaches. She endorses compliance with her immunosuppressive medications for her renal transplant.  In the emergency department, the patient was noted to have serum glucose of 575, sodium 139, CO2 of 16 with anion gap of 24. WBC was 6.9. The patient was given 8 units of NovoLog [place] and 15 units of 70/30 by EDP prior to my arrival. The patient was also given a liter of normal saline and then started on a drip. She was admitted for further evaluation and management.   Hospital Course:  Diabetic ketoacidosis  -Upon admission pt was placed on IV insulin drip and when her BG control improved as per protocol she was transitioned to 70/30 SQ  With sliding scale coverage monitor  -Her accuchecks were monitored and she was covered with SSI, and on follow up her 70/30 dose was increased to 20u BID and her BG control has improved -She was given INFO on admission per CM for assistance with her 70/30 and CM again to see pt prior to discharge and also to asssist with setting up with PCP for her DM management. IDDM  -see as discussed above -Poorly controlled on admit-due to noncompliance with meds, see management as above.  -06/05/2013 HbA1c--12.2  -Case manager in ED provided the patient information and contact numbers for medication assistance for her insulin  Hyponatremia  -pseudohyponatremia due to hyperglcemia  -Continue management of hyperglycemia as above.  S/p Renal Transplant  -Continue immune suppressants  -cr remains wnl -f/u with Dr. Arrie Aran.  Hypothyroidism  -Continue synthroid.  -TSH 2.194  HTN  -continue coreg and amlodipine  Dyspnea on exertion  -Possibly was due to compensation from her metabolic acidosis, now resolved.  -Chest x-ray with no acute infiltrates and troponins negative  Right lower extremity pain and edema  -Venous duplex negative for DVT  Diarrhea  -resolvedC. difficile  studies neg, possibly osmotic due to hyperglycemia/long-standing uncontrolled blood sugars      Procedures:  none  Consultations:  none  Discharge Exam: Filed Vitals:   08/06/13 0930  BP: 135/58  Pulse: 77  Temp: 97.5 F (36.4 C)  Resp: 18   Exam:  General: alert & oriented x 3 In NAD  Cardiovascular: RRR, nl S1 s2  Respiratory: CTAB  Abdomen: soft +BS NT/ND, no masses palpable  Extremities: No cyanosis and no edema     Discharge Instructions  Discharge Orders   Future Orders Complete By Expires   Diet Carb Modified  As directed    Increase activity slowly  As directed        Medication List         acetaminophen 500 MG tablet  Commonly known as:  TYLENOL  Take 500 mg by mouth every 6 (six) hours as needed.     amLODipine 5 MG tablet  Commonly known as:  NORVASC  Take 1 tablet (5 mg total) by mouth daily.     carvedilol 25 MG tablet  Commonly known as:  COREG  Take 2 tablets (50 mg total) by mouth 2 (two) times daily.     insulin aspart protamine- aspart (70-30) 100 UNIT/ML injection  Commonly known as:  NOVOLOG MIX 70/30  Inject 0.2 mLs (20 Units total) into the skin 2 (two) times daily with a meal.     levothyroxine 137 MCG tablet  Commonly known as:  SYNTHROID, LEVOTHROID  Take 137 mcg by mouth daily. Name brand     multivitamin with minerals Tabs tablet  Take 1 tablet by mouth daily.     mycophenolate 250 MG capsule  Commonly known as:  CELLCEPT  Take 500 mg by mouth 2 (two) times daily.     predniSONE 5 MG tablet  Commonly known as:  DELTASONE  Take 5 mg by mouth daily.     ranitidine 150 MG capsule  Commonly known as:  ZANTAC  Take 150 mg by mouth daily.     tacrolimus 1 MG capsule  Commonly known as:  PROGRAF  Take 1 mg by mouth 2 (two) times daily.       Allergies  Allergen Reactions  . Ace Inhibitors Other (See Comments)    Cough   ( Lisinopril )  . Adhesive [Tape] Itching       Follow-up Information   Follow up  with COLADONATO,JOSEPH A, MD. (in 1-2weks, call for appt upon discharge)    Specialty:  Nephrology   Contact information:   [address] [facility] [place] [address] [phone]       Please follow up. (PCP in 1week, call for appt upon discharge)        The results of significant diagnostics from this hospitalization (including imaging, microbiology, ancillary and laboratory) are listed below for reference.    Significant Diagnostic Studies: Dg Chest 2 View  08/04/2013   CLINICAL DATA:  Diabetes with ketoacidosis.  EXAM: CHEST  2 VIEW  COMPARISON:  03/04/2013  FINDINGS: The heart size and mediastinal contours are within normal limits. Both lungs are clear. The visualized skeletal  structures are unremarkable.  IMPRESSION: Normal exam.   Electronically Signed   By: Kennith Center M.D.   On: 08/04/2013 13:15   Dg Abd 2 Views  08/04/2013   CLINICAL DATA:  Diabetes with ketoacidosis  EXAM: ABDOMEN - 2 VIEW  COMPARISON:  None.  FINDINGS: There is no evidence for gaseous bowel dilation to suggest obstruction. No evidence for intraperitoneal free air. Vascular calcifications seen over both pelvic sidewalls. No unexpected abdominal pelvic calcification. Degenerative changes noted in the lower lumbar spine.  IMPRESSION: Normal bowel gas pattern.   Electronically Signed   By: Kennith Center M.D.   On: 08/04/2013 13:16    Microbiology: Recent Results (from the past 240 hour(s))  MRSA PCR SCREENING     Status: Abnormal   Collection Time    08/04/13  1:51 PM      Result Value Range Status   MRSA by PCR POSITIVE (*) NEGATIVE Final   Comment:            The GeneXpert MRSA Assay (FDA     approved for NASAL specimens     only), is one component of a     comprehensive MRSA colonization     surveillance program. It is not     intended to diagnose MRSA     infection nor to guide or     monitor treatment for     MRSA infections.     RESULT CALLED TO, READ BACK BY AND VERIFIED  WITH:     Eleonore Chiquito [id number] @ 1830 BY J SCOTTON  CLOSTRIDIUM DIFFICILE BY PCR     Status: None   Collection Time    08/05/13 10:30 AM      Result Value Range Status   C difficile by pcr NEGATIVE  NEGATIVE Final   Comment: Performed at [facility]  STOOL CULTURE     Status: None   Collection Time    08/05/13 10:38 AM      Result Value Range Status   Specimen Description STOOL   Final   Special Requests NONE   Final   Culture     Final   Value: Culture reincubated for better growth     Performed at [facility]   Report Status PENDING   Incomplete     Labs: Basic Metabolic Panel:  Recent Labs Lab 08/04/13 1441 08/04/13 1636 08/04/13 1834 08/04/13 2343 08/06/13 0356  NA 135 135 135 134* 135  K 4.6 4.9 4.4 4.0 4.0  CL 97 100 101 99 102  CO2 18* 20 21 21 22   GLUCOSE 356* 259* 199* 150* 219*  BUN 29* 28* 25* 21 13  CREATININE 1.05 0.96 0.90 0.77 0.81  CALCIUM 10.7* 10.6* 10.4 10.1 10.1   Liver Function Tests: No results found for this basename: AST, ALT, ALKPHOS, BILITOT, PROT, ALBUMIN,  in the last 168 hours No results found for this basename: LIPASE, AMYLASE,  in the last 168 hours No results found for this basename: AMMONIA,  in the last 168 hours CBC:  Recent Labs Lab 08/04/13 0950 08/06/13 0356  WBC 6.9 3.5*  NEUTROABS 5.2  --   HGB 15.2* 13.9  HCT 45.9 42.1  MCV 90.7 89.2  PLT 190 144*   Cardiac Enzymes:  Recent Labs Lab 08/04/13 1245 08/04/13 1834 08/04/13 2343  TROPONINI <0.30 <0.30 <0.30   BNP: BNP (last 3 results) No results found for this basename: PROBNP,  in the last 8760 hours CBG:  Recent Labs  Lab 08/05/13 1149 08/05/13 1658 08/05/13 2152 08/06/13 0745 08/06/13 1143  GLUCAP 145* 316* 297* 238* 177*       Signed:  Anissia Wessells C  [facility] 08/06/2013, 1:32 PM

## 2013-08-09 LAB — STOOL CULTURE

## 2013-08-09 LAB — GLUTAMIC ACID DECARBOXYLASE AUTO ABS: Glutamic Acid Decarb Ab: 19.1 U/mL — ABNORMAL HIGH (ref <=1.0)

## 2013-09-07 IMAGING — CR DG CHEST 2V
2 series · 2 of 2 positions shown · non-contrast
Comparison: Chest x-ray 06/21/2011.

CLINICAL DATA: Dizziness, cough, chest pain and shortness of
breath.

CHEST - 2 VIEW

[w chest lat]
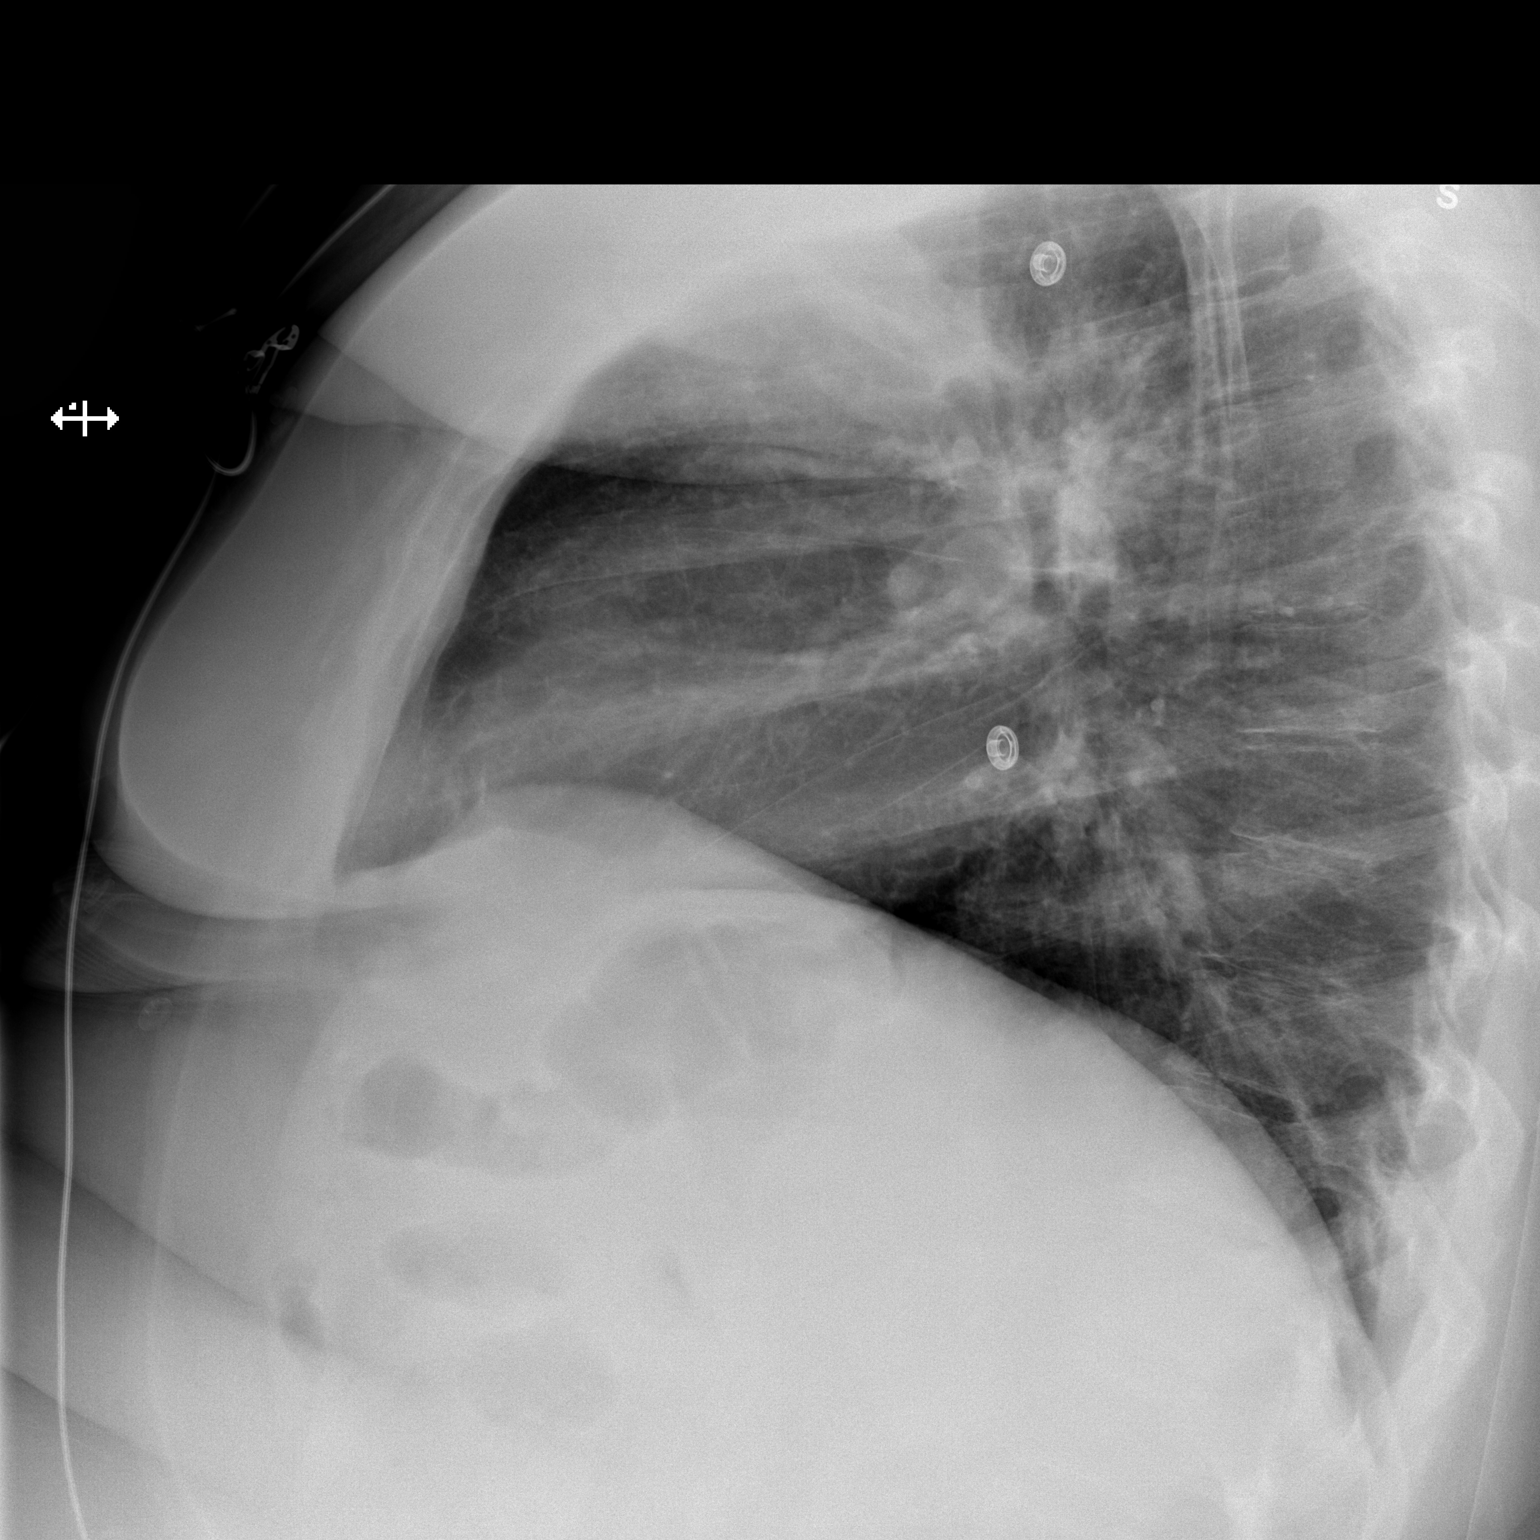

[x chest ap]
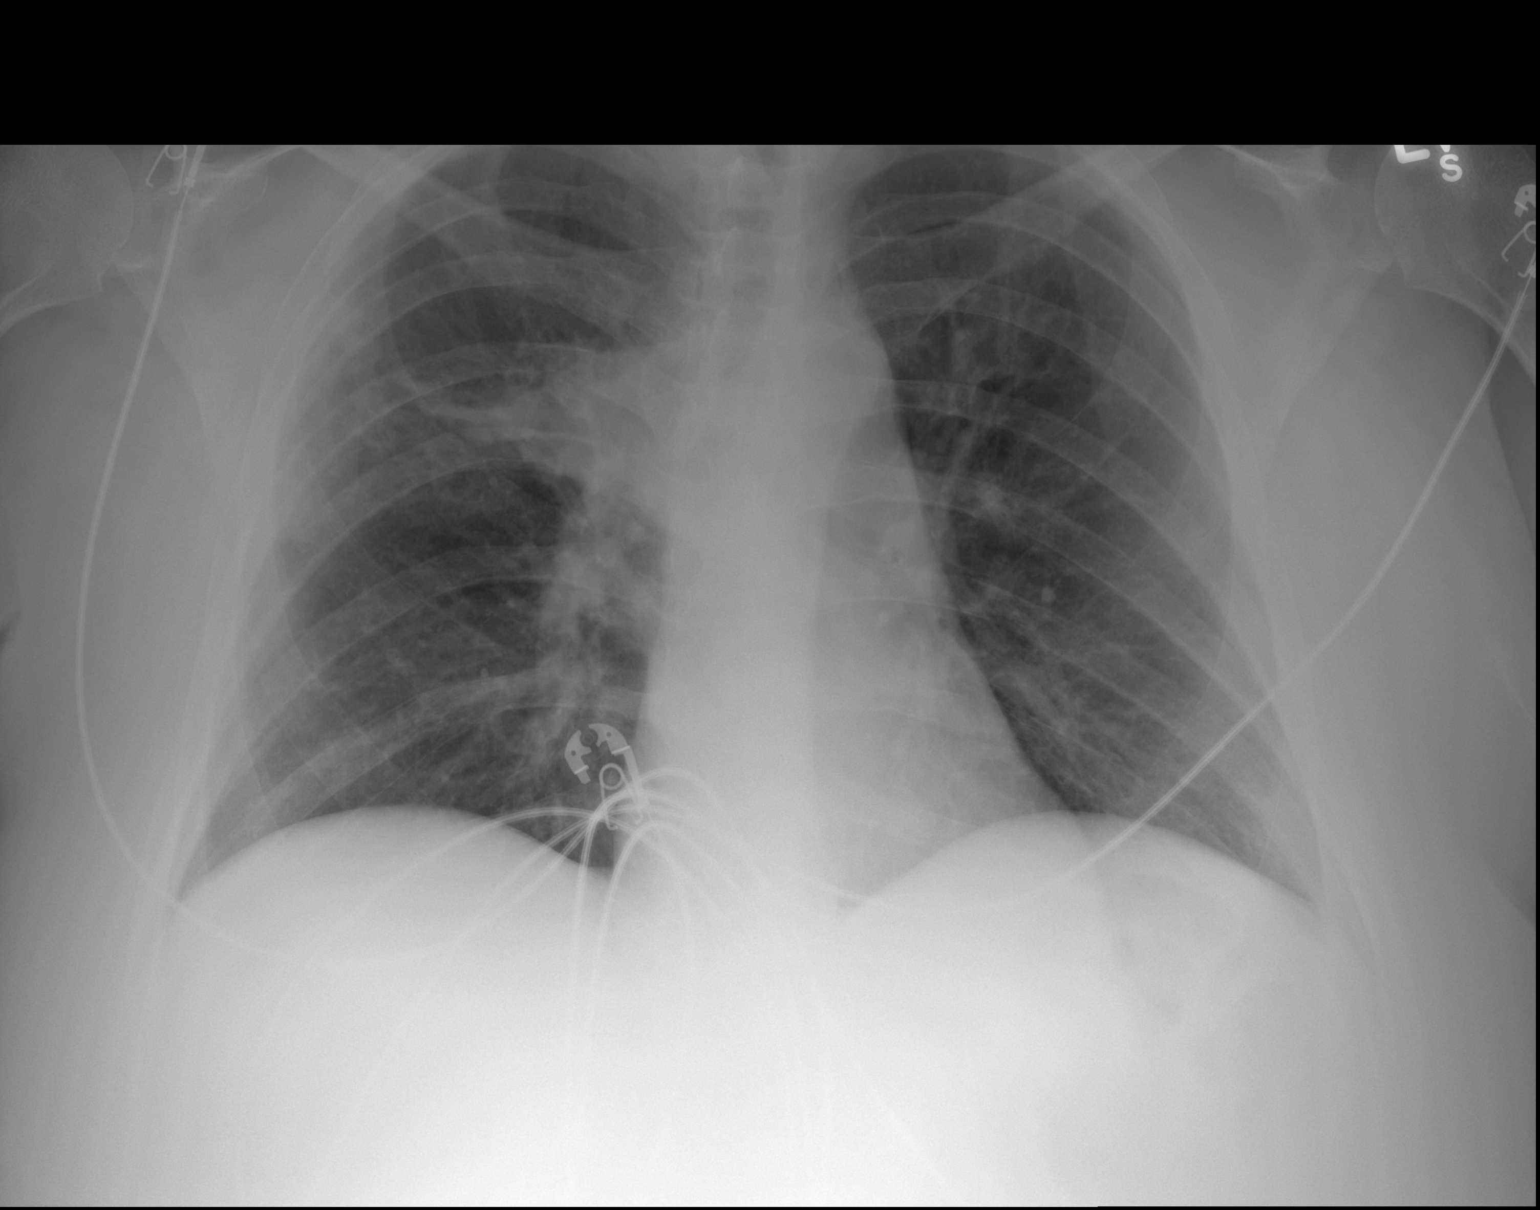

[2 of 2 positions shown; findings below may reference images not displayed]

FINDINGS: Compared to the prior examination, there is a new area of
airspace consolidation in the right upper lobe.  Lungs otherwise
appear clear.  No definite pleural effusions.  Pulmonary
vasculature and the cardiomediastinal silhouette are within normal
limits.
IMPRESSION: 1.  New area of airspace consolidation in the right upper lobe.
This is likely to represent a pneumonia.  Follow-up radiographs
after appropriate antimicrobial therapy is recommended to confirm
resolution of this finding (i.e., to exclude a central obstructing
lesion).

## 2014-03-21 ENCOUNTER — Inpatient Hospital Stay (HOSPITAL_COMMUNITY)
Admission: EM | Admit: 2014-03-21 | Discharge: 2014-03-23 | DRG: 638 | Disposition: A | Discharge status: Home / Self Care | Payer: Medicare Other | Attending: Internal Medicine | Admitting: Internal Medicine

## 2014-03-21 ENCOUNTER — Encounter (HOSPITAL_COMMUNITY): Payer: Self-pay | Admitting: Emergency Medicine

## 2014-03-21 DIAGNOSIS — Z794 Long term (current) use of insulin: Secondary | ICD-10-CM

## 2014-03-21 DIAGNOSIS — Z87891 Personal history of nicotine dependence: Secondary | ICD-10-CM | POA: Diagnosis not present

## 2014-03-21 DIAGNOSIS — E039 Hypothyroidism, unspecified: Secondary | ICD-10-CM | POA: Diagnosis present

## 2014-03-21 DIAGNOSIS — N179 Acute kidney failure, unspecified: Secondary | ICD-10-CM | POA: Diagnosis present

## 2014-03-21 DIAGNOSIS — B370 Candidal stomatitis: Secondary | ICD-10-CM | POA: Diagnosis present

## 2014-03-21 DIAGNOSIS — R6510 Systemic inflammatory response syndrome (SIRS) of non-infectious origin without acute organ dysfunction: Secondary | ICD-10-CM | POA: Diagnosis present

## 2014-03-21 DIAGNOSIS — I4510 Unspecified right bundle-branch block: Secondary | ICD-10-CM | POA: Diagnosis present

## 2014-03-21 DIAGNOSIS — E871 Hypo-osmolality and hyponatremia: Secondary | ICD-10-CM | POA: Diagnosis present

## 2014-03-21 DIAGNOSIS — Z940 Kidney transplant status: Secondary | ICD-10-CM | POA: Diagnosis not present

## 2014-03-21 DIAGNOSIS — E860 Dehydration: Secondary | ICD-10-CM | POA: Diagnosis present

## 2014-03-21 DIAGNOSIS — E1010 Type 1 diabetes mellitus with ketoacidosis without coma: Secondary | ICD-10-CM | POA: Diagnosis present

## 2014-03-21 DIAGNOSIS — E876 Hypokalemia: Secondary | ICD-10-CM | POA: Diagnosis present

## 2014-03-21 DIAGNOSIS — E872 Acidosis, unspecified: Secondary | ICD-10-CM

## 2014-03-21 DIAGNOSIS — Z79899 Other long term (current) drug therapy: Secondary | ICD-10-CM

## 2014-03-21 DIAGNOSIS — I959 Hypotension, unspecified: Secondary | ICD-10-CM | POA: Diagnosis present

## 2014-03-21 DIAGNOSIS — E119 Type 2 diabetes mellitus without complications: Secondary | ICD-10-CM | POA: Diagnosis present

## 2014-03-21 DIAGNOSIS — Z823 Family history of stroke: Secondary | ICD-10-CM

## 2014-03-21 DIAGNOSIS — Z9849 Cataract extraction status, unspecified eye: Secondary | ICD-10-CM

## 2014-03-21 DIAGNOSIS — E1310 Other specified diabetes mellitus with ketoacidosis without coma: Secondary | ICD-10-CM

## 2014-03-21 DIAGNOSIS — R197 Diarrhea, unspecified: Secondary | ICD-10-CM | POA: Diagnosis present

## 2014-03-21 DIAGNOSIS — I10 Essential (primary) hypertension: Secondary | ICD-10-CM | POA: Diagnosis present

## 2014-03-21 DIAGNOSIS — E1110 Type 2 diabetes mellitus with ketoacidosis without coma: Secondary | ICD-10-CM | POA: Diagnosis present

## 2014-03-21 DIAGNOSIS — R112 Nausea with vomiting, unspecified: Secondary | ICD-10-CM | POA: Diagnosis present

## 2014-03-21 LAB — CBC WITH DIFFERENTIAL/PLATELET
Basophils Absolute: 0 10*3/uL (ref 0.0–0.1)
Basophils Relative: 0 % (ref 0–1)
Eosinophils Absolute: 0 10*3/uL (ref 0.0–0.7)
Eosinophils Relative: 0 % (ref 0–5)
HCT: 47 % — ABNORMAL HIGH (ref 36.0–46.0)
HEMOGLOBIN: 15.4 g/dL — AB (ref 12.0–15.0)
Lymphocytes Relative: 15 % (ref 12–46)
Lymphs Abs: 1.1 10*3/uL (ref 0.7–4.0)
MCH: 29.8 pg (ref 26.0–34.0)
MCHC: 32.8 g/dL (ref 30.0–36.0)
MCV: 91.1 fL (ref 78.0–100.0)
MONOS PCT: 12 % (ref 3–12)
Monocytes Absolute: 0.9 10*3/uL (ref 0.1–1.0)
NEUTROS ABS: 5.5 10*3/uL (ref 1.7–7.7)
Neutrophils Relative %: 73 % (ref 43–77)
Platelets: 175 10*3/uL (ref 150–400)
RBC: 5.16 MIL/uL — ABNORMAL HIGH (ref 3.87–5.11)
RDW: 13 % (ref 11.5–15.5)
WBC: 7.5 10*3/uL (ref 4.0–10.5)

## 2014-03-21 LAB — I-STAT CHEM 8, ED
BUN: 55 mg/dL — AB (ref 6–23)
CALCIUM ION: 1.25 mmol/L — AB (ref 1.12–1.23)
CHLORIDE: 99 meq/L (ref 96–112)
CREATININE: 1.8 mg/dL — AB (ref 0.50–1.10)
GLUCOSE: 461 mg/dL — AB (ref 70–99)
HCT: 51 % — ABNORMAL HIGH (ref 36.0–46.0)
Hemoglobin: 17.3 g/dL — ABNORMAL HIGH (ref 12.0–15.0)
Potassium: 5.4 mEq/L — ABNORMAL HIGH (ref 3.7–5.3)
Sodium: 131 mEq/L — ABNORMAL LOW (ref 137–147)
TCO2: 18 mmol/L (ref 0–100)

## 2014-03-21 LAB — COMPREHENSIVE METABOLIC PANEL
ALBUMIN: 3.3 g/dL — AB (ref 3.5–5.2)
ALK PHOS: 120 U/L — AB (ref 39–117)
ALT: 13 U/L (ref 0–35)
AST: 14 U/L (ref 0–37)
Anion gap: 26 — ABNORMAL HIGH (ref 5–15)
BILIRUBIN TOTAL: 0.8 mg/dL (ref 0.3–1.2)
BUN: 38 mg/dL — AB (ref 6–23)
CHLORIDE: 92 meq/L — AB (ref 96–112)
CO2: 16 mEq/L — ABNORMAL LOW (ref 19–32)
Calcium: 10.6 mg/dL — ABNORMAL HIGH (ref 8.4–10.5)
Creatinine, Ser: 1.56 mg/dL — ABNORMAL HIGH (ref 0.50–1.10)
GFR calc Af Amer: 45 mL/min — ABNORMAL LOW (ref >90)
GFR calc non Af Amer: 39 mL/min — ABNORMAL LOW (ref >90)
Glucose, Bld: 483 mg/dL — ABNORMAL HIGH (ref 70–99)
POTASSIUM: 4.9 meq/L (ref 3.7–5.3)
Sodium: 134 mEq/L — ABNORMAL LOW (ref 137–147)
Total Protein: 7.5 g/dL (ref 6.0–8.3)

## 2014-03-21 LAB — URINALYSIS, ROUTINE W REFLEX MICROSCOPIC
KETONES UR: 40 mg/dL — AB
Leukocytes, UA: NEGATIVE
Nitrite: NEGATIVE
PH: 5 (ref 5.0–8.0)
Protein, ur: NEGATIVE mg/dL
Specific Gravity, Urine: 1.029 (ref 1.005–1.030)
Urobilinogen, UA: 0.2 mg/dL (ref 0.0–1.0)

## 2014-03-21 LAB — URINE MICROSCOPIC-ADD ON

## 2014-03-21 LAB — I-STAT CG4 LACTIC ACID, ED: Lactic Acid, Venous: 4.55 mmol/L — ABNORMAL HIGH (ref 0.5–2.2)

## 2014-03-21 MED ORDER — SODIUM CHLORIDE 0.9 % IV BOLUS (SEPSIS)
500.0000 mL | Freq: Once | INTRAVENOUS | Status: AC
  Administered 2014-03-21: 500 mL via INTRAVENOUS

## 2014-03-21 MED ORDER — ADULT MULTIVITAMIN W/MINERALS CH
1.0000 | ORAL_TABLET | Freq: Every day | ORAL | Status: DC
  Administered 2014-03-22 – 2014-03-23 (×2): 1 via ORAL
  Filled 2014-03-21 (×2): qty 1

## 2014-03-21 MED ORDER — DEXTROSE-NACL 5-0.45 % IV SOLN
INTRAVENOUS | Status: DC
  Administered 2014-03-22 (×2): via INTRAVENOUS

## 2014-03-21 MED ORDER — PREDNISONE 5 MG PO TABS
5.0000 mg | ORAL_TABLET | Freq: Every day | ORAL | Status: DC
  Administered 2014-03-22 – 2014-03-23 (×2): 5 mg via ORAL
  Filled 2014-03-21 (×3): qty 1

## 2014-03-21 MED ORDER — SODIUM CHLORIDE 0.9 % IV SOLN
INTRAVENOUS | Status: DC
  Administered 2014-03-23: 09:00:00 via INTRAVENOUS

## 2014-03-21 MED ORDER — NYSTATIN 100000 UNIT/GM EX POWD
Freq: Once | CUTANEOUS | Status: AC
  Administered 2014-03-22: 01:00:00 via TOPICAL
  Filled 2014-03-21: qty 15

## 2014-03-21 MED ORDER — DEXTROSE-NACL 5-0.45 % IV SOLN
INTRAVENOUS | Status: DC
Start: 2014-03-21 — End: 2014-03-22

## 2014-03-21 MED ORDER — DEXTROSE 50 % IV SOLN: 25.0000 mL | INTRAVENOUS | Status: DC | PRN

## 2014-03-21 MED ORDER — CARVEDILOL 12.5 MG PO TABS
12.5000 mg | ORAL_TABLET | Freq: Two times a day (BID) | ORAL | Status: DC
  Administered 2014-03-22 – 2014-03-23 (×3): 12.5 mg via ORAL
  Filled 2014-03-21 (×5): qty 1

## 2014-03-21 MED ORDER — ONDANSETRON HCL 4 MG/2ML IJ SOLN: 4.0000 mg | Freq: Three times a day (TID) | INTRAMUSCULAR | Status: DC | PRN

## 2014-03-21 MED ORDER — SODIUM CHLORIDE 0.9 % IV SOLN
INTRAVENOUS | Status: AC
Start: 2014-03-22 — End: 2014-03-22
  Administered 2014-03-22: via INTRAVENOUS

## 2014-03-21 MED ORDER — SODIUM CHLORIDE 0.9 % IV SOLN
INTRAVENOUS | Status: DC
  Administered 2014-03-22: 02:00:00 via INTRAVENOUS

## 2014-03-21 MED ORDER — LEVOTHYROXINE SODIUM 137 MCG PO TABS
137.0000 ug | ORAL_TABLET | Freq: Every day | ORAL | Status: DC
  Administered 2014-03-22 – 2014-03-23 (×2): 137 ug via ORAL
  Filled 2014-03-21 (×3): qty 1

## 2014-03-21 MED ORDER — SODIUM CHLORIDE 0.9 % IV SOLN
INTRAVENOUS | Status: DC
  Filled 2014-03-21: qty 1

## 2014-03-21 MED ORDER — TACROLIMUS 1 MG PO CAPS
1.0000 mg | ORAL_CAPSULE | Freq: Two times a day (BID) | ORAL | Status: DC
  Administered 2014-03-22 – 2014-03-23 (×3): 1 mg via ORAL
  Filled 2014-03-21 (×5): qty 1

## 2014-03-21 MED ORDER — HEPARIN SODIUM (PORCINE) 5000 UNIT/ML IJ SOLN
5000.0000 [IU] | Freq: Three times a day (TID) | INTRAMUSCULAR | Status: DC
  Administered 2014-03-22 – 2014-03-23 (×6): 5000 [IU] via SUBCUTANEOUS
  Filled 2014-03-21 (×9): qty 1

## 2014-03-21 MED ORDER — SODIUM CHLORIDE 0.9 % IV SOLN: 1000.0000 mL | INTRAVENOUS | Status: DC

## 2014-03-21 MED ORDER — SODIUM CHLORIDE 0.9 % IV SOLN: 1000.0000 mL | Freq: Once | INTRAVENOUS | Status: AC

## 2014-03-21 MED ORDER — FAMOTIDINE 10 MG PO TABS
10.0000 mg | ORAL_TABLET | Freq: Every day | ORAL | Status: DC
  Administered 2014-03-22 – 2014-03-23 (×2): 10 mg via ORAL
  Filled 2014-03-21 (×2): qty 1

## 2014-03-21 MED ORDER — INSULIN REGULAR HUMAN 100 UNIT/ML IJ SOLN
INTRAMUSCULAR | Status: DC
  Administered 2014-03-22: 3.5 [IU]/h via INTRAVENOUS
  Filled 2014-03-21: qty 1

## 2014-03-21 MED ORDER — SODIUM CHLORIDE 0.9 % IV BOLUS (SEPSIS)
1000.0000 mL | Freq: Once | INTRAVENOUS | Status: AC
  Administered 2014-03-21: 1000 mL via INTRAVENOUS

## 2014-03-21 MED ORDER — MYCOPHENOLATE MOFETIL 250 MG PO CAPS
500.0000 mg | ORAL_CAPSULE | Freq: Two times a day (BID) | ORAL | Status: DC
  Administered 2014-03-22 – 2014-03-23 (×3): 500 mg via ORAL
  Filled 2014-03-21 (×5): qty 2

## 2014-03-21 NOTE — ED Notes (Signed)
Pt transported from home by EMS with c/o generalized weakness onset 3 days. CBG 488. Renal transplant 2012. A & O.

## 2014-03-21 NOTE — ED Notes (Signed)
PA Geiple at bedside.  

## 2014-03-21 NOTE — ED Notes (Addendum)
Pt ambulated to the bathroom with no assistance and did not appear to be in any distress walking to the bathroom. Returning from the bathroom the patient did appear to be off balance and out of breath.

## 2014-03-21 NOTE — ED Notes (Signed)
Geiple, PA given CG4 Lactic results. 

## 2014-03-21 NOTE — ED Notes (Signed)
Pt states she has been weak with n/v/d for the past 2 days. Pt states she gets dizzy with exertion. Pt states her stomach is "sore" from vomiting. Pt also states she has a headache. Pt states she is not very nauseous now. Pt alert, no acute distress. Skin warm and dry.

## 2014-03-21 NOTE — ED Provider Notes (Signed)
CSN: [id number]     Arrival date & time 03/21/14  2116 History   First MD Initiated Contact with Patient 03/21/14 2126     Chief Complaint  Patient presents with  . Weakness     (Consider location/radiation/quality/duration/timing/severity/associated sxs/prior Treatment) HPI Comments: Patient with history of IDDM, renal transplant presents with complaint of generalized weakness, lightheadedness with standing, vomiting, abdominal soreness for the past 3 days. Patient had watery diarrhea that started today. She is uncertain if she was able to keep down her medications today because she vomits whenever she eats or drinks. She has been compliant with insulin. No fever, chest pain, or cough. No urinary symptoms or change in urination. No rash or tick bite. No problems in regards to her renal function/transplant and no pain over her graft site. The onset of this condition was acute. The course is constant. Aggravating factors: none. Alleviating factors: none.    The history is provided by the patient and medical records.    Past Medical History  Diagnosis Date  . Hypertension   . Hypothyroid   . Depression   . 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 Kidney disease   . End stage renal disease     right arm AV graft, post transplant  . Diabetes mellitus     Insulin dependant   Past Surgical History  Procedure Laterality Date  . Back surgery  2001  . Tubal ligation    . Dg av dialysis graft declot or    . Nephrectomy transplanted organ    . Kidney transplant  December 16, 2009  . 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  . Cataract extraction w/ intraocular lens implant Right    Family History  Problem Relation Age of Onset  . Emphysema Mother   . Throat cancer Mother   . COPD Mother   . Cancer Mother   . Emphysema Father   . COPD Father   . Stroke Father   . ADD / ADHD Son    History  Substance Use Topics  . Smoking status: Former Smoker -- 1.00 packs/day for 11  years    Types: Cigarettes    Quit date: 09/21/1998  . Smokeless tobacco: Never Used  . Alcohol Use: No   OB History   Grav Para Term Preterm Abortions TAB SAB Ect Mult Living                 Review of Systems  Constitutional: Positive for fatigue. Negative for fever, chills and appetite change.  HENT: Negative for rhinorrhea and sore throat.   Eyes: Negative for redness.  Respiratory: Negative for cough and shortness of breath.   Cardiovascular: Negative for chest pain.  Gastrointestinal: Positive for nausea, vomiting, abdominal pain (mild) and diarrhea.  Genitourinary: Negative for dysuria and difficulty urinating.  Musculoskeletal: Negative for myalgias.  Skin: Negative for rash.  Neurological: Positive for light-headedness. Negative for weakness (no focal weakness) and headaches.    Allergies  Ace inhibitors and Adhesive  Home Medications   Prior to Admission medications   Medication Sig Start Date End Date Taking? Authorizing Provider  acetaminophen (TYLENOL) 500 MG tablet Take 500 mg by mouth every 6 (six) hours as needed.    Historical Provider, MD  amLODipine (NORVASC) 5 MG tablet Take 1 tablet (5 mg total) by mouth daily. 08/08/12   Alison Murray, MD  carvedilol (COREG) 25 MG tablet Take 2 tablets (50 mg total) by mouth 2 (two)  times daily. 08/08/12   Alison Murray, MD  insulin aspart protamine- aspart (NOVOLOG MIX 70/30) (70-30) 100 UNIT/ML injection Inject 0.2 mLs (20 Units total) into the skin 2 (two) times daily with a meal. 08/06/13   Adeline C Viyuoh, MD  levothyroxine (SYNTHROID, LEVOTHROID) 137 MCG tablet Take 137 mcg by mouth daily. Name brand    Historical Provider, MD  Multiple Vitamin (MULTIVITAMIN WITH MINERALS) TABS tablet Take 1 tablet by mouth daily.    Historical Provider, MD  mycophenolate (CELLCEPT) 250 MG capsule Take 500 mg by mouth 2 (two) times daily.     Historical Provider, MD  predniSONE (DELTASONE) 5 MG tablet Take 5 mg by mouth daily.       Historical Provider, MD  ranitidine (ZANTAC) 150 MG capsule Take 150 mg by mouth daily.     Historical Provider, MD  tacrolimus (PROGRAF) 1 MG capsule Take 1 mg by mouth 2 (two) times daily.     Historical Provider, MD   BP 105/88  Pulse 81  Temp(Src) 98.1 F (36.7 C) (Oral)  Resp 18  Ht 5\' 8"  (1.727 m)  Wt 230 lb (104.327 kg)  BMI 34.98 kg/m2  SpO2 95%  LMP 11/19/2011  Physical Exam  Nursing note and vitals reviewed. Constitutional: She is oriented to person, place, and time. She appears well-developed and well-nourished.  HENT:  Head: Normocephalic and atraumatic.  Right Ear: External ear normal.  Nose: Nose normal.  Mouth/Throat: Mucous membranes are dry. No oropharyngeal exudate, posterior oropharyngeal edema or posterior oropharyngeal erythema.  Eyes: Conjunctivae are normal. Pupils are equal, round, and reactive to light. Right eye exhibits no discharge. Left eye exhibits no discharge.  Neck: Normal range of motion. Neck supple.  Cardiovascular: Normal rate, regular rhythm and normal heart sounds.   No murmur heard. Pulmonary/Chest: Effort normal and breath sounds normal. No respiratory distress. She has no wheezes. She has no rales.  Abdominal: Soft. Bowel sounds are normal. There is tenderness (mild, generalized, extensive previous surgical scarring. There is no focal tenderness over graft.). There is no rebound and no guarding.  Musculoskeletal: She exhibits no edema and no tenderness.  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  Neurological: She is alert and oriented to person, place, and time. No cranial nerve deficit.  Skin: Skin is warm and dry. No rash noted.  L inguinal crease cutaneous candidiasis  Psychiatric: She has a normal mood and affect.    ED Course  Procedures (including critical care time) Labs Review Labs Reviewed  CBC WITH DIFFERENTIAL - Abnormal; Notable for the following:    RBC 5.16 (*)    Hemoglobin 15.4 (*)    HCT 47.0 (*)    All other  components within normal limits  COMPREHENSIVE METABOLIC PANEL - Abnormal; Notable for the following:    Sodium 134 (*)    Chloride 92 (*)    CO2 16 (*)    Glucose, Bld 483 (*)    BUN 38 (*)    Creatinine, Ser 1.56 (*)    Calcium 10.6 (*)    Albumin 3.3 (*)    Alkaline Phosphatase 120 (*)    GFR calc non Af Amer 39 (*)    GFR calc Af Amer 45 (*)    Anion gap 26 (*)    All other components within normal limits  URINALYSIS, ROUTINE W REFLEX MICROSCOPIC - Abnormal; Notable for the following:    Glucose, UA >1000 (*)    Hgb urine dipstick TRACE (*)  Bilirubin Urine MODERATE (*)    Ketones, ur 40 (*)    All other components within normal limits  URINE MICROSCOPIC-ADD ON - Abnormal; Notable for the following:    Bacteria, UA FEW (*)    Casts GRANULAR CAST (*)    All other components within normal limits  I-STAT CHEM 8, ED - Abnormal; Notable for the following:    Sodium 131 (*)    Potassium 5.4 (*)    BUN 55 (*)    Creatinine, Ser 1.80 (*)    Glucose, Bld 461 (*)    Calcium, Ion 1.25 (*)    Hemoglobin 17.3 (*)    HCT 51.0 (*)    All other components within normal limits  I-STAT CG4 LACTIC ACID, ED - Abnormal; Notable for the following:    Lactic Acid, Venous 4.55 (*)    All other components within normal limits    Imaging Review No results found.   EKG Interpretation None      9:47 PM Patient seen and examined. Work-up initiated. Medications ordered.   Vital signs reviewed and are as follows: Filed Vitals:   03/21/14 2119  BP: 105/88  Pulse: 81  Temp: 98.1 F (36.7 C)  Resp: 18   10:05 PM EKG reviewed. Awaiting electrolytes.    Date: 03/21/2014  Rate: 79  Rhythm: normal sinus rhythm  QRS Axis: right  Intervals: normal  ST/T Wave abnormalities: normal  Conduction Disutrbances:none  Narrative Interpretation:   Old EKG Reviewed: unchanged   10:55 PM DKA likely. Lactate elevated. Additional fluids ordered. DKA protocol started. Patient discussed with  Dr. Wilkie AyeHorton who will see. Will admit. Nystatin ordered for candidiasis.    11:07 PM Spoke with Dr. Sharyon MedicusHijazi who will see and admit.   MDM   Final diagnoses:  Diabetic ketoacidosis without coma associated with type 2 diabetes mellitus  Acute renal failure, unspecified acute renal failure type  Lactic acidosis  Dehydration   Admit for above     Renne CriglerJoshua Jalene Lacko, PA-C 03/21/14 2315

## 2014-03-21 NOTE — ED Notes (Signed)
EDP and PA made aware of orthostatic vital signs result

## 2014-03-22 ENCOUNTER — Encounter (HOSPITAL_COMMUNITY): Payer: Self-pay | Admitting: *Deleted

## 2014-03-22 ENCOUNTER — Inpatient Hospital Stay (HOSPITAL_COMMUNITY): Payer: Medicare Other

## 2014-03-22 DIAGNOSIS — R112 Nausea with vomiting, unspecified: Secondary | ICD-10-CM | POA: Diagnosis present

## 2014-03-22 DIAGNOSIS — Z940 Kidney transplant status: Secondary | ICD-10-CM

## 2014-03-22 DIAGNOSIS — E1010 Type 1 diabetes mellitus with ketoacidosis without coma: Principal | ICD-10-CM

## 2014-03-22 DIAGNOSIS — I959 Hypotension, unspecified: Secondary | ICD-10-CM | POA: Diagnosis present

## 2014-03-22 LAB — TROPONIN I
Troponin I: <0.3 ng/mL (ref <0.30)
Troponin I: <0.3 ng/mL (ref <0.30)
Troponin I: <0.3 ng/mL (ref <0.30)

## 2014-03-22 LAB — CBC WITH DIFFERENTIAL/PLATELET
Basophils Absolute: 0 10*3/uL (ref 0.0–0.1)
Basophils Relative: 0 % (ref 0–1)
EOS ABS: 0 10*3/uL (ref 0.0–0.7)
EOS PCT: 1 % (ref 0–5)
HCT: 41.9 % (ref 36.0–46.0)
HEMOGLOBIN: 13.8 g/dL (ref 12.0–15.0)
LYMPHS ABS: 1 10*3/uL (ref 0.7–4.0)
Lymphocytes Relative: 19 % (ref 12–46)
MCH: 29.7 pg (ref 26.0–34.0)
MCHC: 32.9 g/dL (ref 30.0–36.0)
MCV: 90.3 fL (ref 78.0–100.0)
MONOS PCT: 16 % — AB (ref 3–12)
Monocytes Absolute: 0.8 10*3/uL (ref 0.1–1.0)
Neutro Abs: 3.4 10*3/uL (ref 1.7–7.7)
Neutrophils Relative %: 64 % (ref 43–77)
PLATELETS: 140 10*3/uL — AB (ref 150–400)
RBC: 4.64 MIL/uL (ref 3.87–5.11)
RDW: 13.1 % (ref 11.5–15.5)
WBC: 5.3 10*3/uL (ref 4.0–10.5)

## 2014-03-22 LAB — BASIC METABOLIC PANEL
Anion gap: 13 (ref 5–15)
Anion gap: 13 (ref 5–15)
Anion gap: 13 (ref 5–15)
BUN: 36 mg/dL — ABNORMAL HIGH (ref 6–23)
BUN: 33 mg/dL — ABNORMAL HIGH (ref 6–23)
BUN: 33 mg/dL — AB (ref 6–23)
CO2: 22 mEq/L (ref 19–32)
CO2: 21 mEq/L (ref 19–32)
CO2: 22 mEq/L (ref 19–32)
Calcium: 9.4 mg/dL (ref 8.4–10.5)
Calcium: 9.2 mg/dL (ref 8.4–10.5)
Calcium: 9.6 mg/dL (ref 8.4–10.5)
Chloride: 101 mEq/L (ref 96–112)
Chloride: 103 mEq/L (ref 96–112)
Chloride: 102 mEq/L (ref 96–112)
Creatinine, Ser: 1.49 mg/dL — ABNORMAL HIGH (ref 0.50–1.10)
Creatinine, Ser: 1.32 mg/dL — ABNORMAL HIGH (ref 0.50–1.10)
Creatinine, Ser: 1.16 mg/dL — ABNORMAL HIGH (ref 0.50–1.10)
GFR calc Af Amer: 55 mL/min — ABNORMAL LOW (ref >90)
GFR calc non Af Amer: 47 mL/min — ABNORMAL LOW (ref >90)
GFR, EST AFRICAN AMERICAN: 47 mL/min — AB (ref >90)
GFR, EST AFRICAN AMERICAN: 64 mL/min — AB (ref >90)
GFR, EST NON AFRICAN AMERICAN: 41 mL/min — AB (ref >90)
GFR, EST NON AFRICAN AMERICAN: 55 mL/min — AB (ref >90)
Glucose, Bld: 310 mg/dL — ABNORMAL HIGH (ref 70–99)
Glucose, Bld: 197 mg/dL — ABNORMAL HIGH (ref 70–99)
Glucose, Bld: 133 mg/dL — ABNORMAL HIGH (ref 70–99)
POTASSIUM: 3.9 meq/L (ref 3.7–5.3)
POTASSIUM: 3.9 meq/L (ref 3.7–5.3)
Potassium: 4.2 mEq/L (ref 3.7–5.3)
SODIUM: 136 meq/L — AB (ref 137–147)
Sodium: 137 mEq/L (ref 137–147)
Sodium: 137 mEq/L (ref 137–147)

## 2014-03-22 LAB — GLUCOSE, CAPILLARY
GLUCOSE-CAPILLARY: 358 mg/dL — AB (ref 70–99)
GLUCOSE-CAPILLARY: 267 mg/dL — AB (ref 70–99)
GLUCOSE-CAPILLARY: 158 mg/dL — AB (ref 70–99)
GLUCOSE-CAPILLARY: 116 mg/dL — AB (ref 70–99)
GLUCOSE-CAPILLARY: 153 mg/dL — AB (ref 70–99)
GLUCOSE-CAPILLARY: 164 mg/dL — AB (ref 70–99)
GLUCOSE-CAPILLARY: 318 mg/dL — AB (ref 70–99)
Glucose-Capillary: 130 mg/dL — ABNORMAL HIGH (ref 70–99)
Glucose-Capillary: 176 mg/dL — ABNORMAL HIGH (ref 70–99)
Glucose-Capillary: 219 mg/dL — ABNORMAL HIGH (ref 70–99)
Glucose-Capillary: 414 mg/dL — ABNORMAL HIGH (ref 70–99)
Glucose-Capillary: 107 mg/dL — ABNORMAL HIGH (ref 70–99)
Glucose-Capillary: 238 mg/dL — ABNORMAL HIGH (ref 70–99)

## 2014-03-22 LAB — HEMOGLOBIN A1C
Hgb A1c MFr Bld: 11.5 % — ABNORMAL HIGH (ref <5.7)
MEAN PLASMA GLUCOSE: 283 mg/dL — AB (ref <117)

## 2014-03-22 LAB — MRSA PCR SCREENING: MRSA by PCR: POSITIVE — AB

## 2014-03-22 LAB — LACTIC ACID, PLASMA: Lactic Acid, Venous: 1.6 mmol/L (ref 0.5–2.2)

## 2014-03-22 MED ORDER — INSULIN ASPART PROT & ASPART (70-30 MIX) 100 UNIT/ML ~~LOC~~ SUSP
5.0000 [IU] | Freq: Once | SUBCUTANEOUS | Status: AC
  Administered 2014-03-22: 5 [IU] via SUBCUTANEOUS

## 2014-03-22 MED ORDER — INSULIN ASPART PROT & ASPART (70-30 MIX) 100 UNIT/ML ~~LOC~~ SUSP
25.0000 [IU] | Freq: Two times a day (BID) | SUBCUTANEOUS | Status: DC
  Administered 2014-03-23: 25 [IU] via SUBCUTANEOUS
  Filled 2014-03-22: qty 10

## 2014-03-22 MED ORDER — INSULIN ASPART 100 UNIT/ML ~~LOC~~ SOLN
0.0000 [IU] | Freq: Three times a day (TID) | SUBCUTANEOUS | Status: DC
  Administered 2014-03-22: 7 [IU] via SUBCUTANEOUS
  Administered 2014-03-22: 2 [IU] via SUBCUTANEOUS

## 2014-03-22 MED ORDER — ACETAMINOPHEN 325 MG PO TABS
650.0000 mg | ORAL_TABLET | ORAL | Status: DC | PRN
  Administered 2014-03-22 – 2014-03-23 (×2): 650 mg via ORAL
  Filled 2014-03-22 (×2): qty 2

## 2014-03-22 MED ORDER — MUPIROCIN 2 % EX OINT
1.0000 "application " | TOPICAL_OINTMENT | Freq: Two times a day (BID) | CUTANEOUS | Status: DC
  Administered 2014-03-22 – 2014-03-23 (×4): 1 via NASAL
  Filled 2014-03-22: qty 22

## 2014-03-22 MED ORDER — INSULIN ASPART 100 UNIT/ML ~~LOC~~ SOLN
0.0000 [IU] | Freq: Three times a day (TID) | SUBCUTANEOUS | Status: DC
  Administered 2014-03-23: 3 [IU] via SUBCUTANEOUS
  Administered 2014-03-23: 8 [IU] via SUBCUTANEOUS

## 2014-03-22 MED ORDER — GLUCERNA SHAKE PO LIQD
237.0000 mL | Freq: Two times a day (BID) | ORAL | Status: DC
  Administered 2014-03-23 (×2): 237 mL via ORAL
  Filled 2014-03-22 (×3): qty 237

## 2014-03-22 MED ORDER — LOPERAMIDE HCL 2 MG PO CAPS
4.0000 mg | ORAL_CAPSULE | Freq: Once | ORAL | Status: AC
  Administered 2014-03-22: 4 mg via ORAL
  Filled 2014-03-22: qty 2

## 2014-03-22 MED ORDER — CHLORHEXIDINE GLUCONATE CLOTH 2 % EX PADS
6.0000 | MEDICATED_PAD | Freq: Every day | CUTANEOUS | Status: DC
  Administered 2014-03-22 – 2014-03-23 (×2): 6 via TOPICAL

## 2014-03-22 MED ORDER — INSULIN ASPART PROT & ASPART (70-30 MIX) 100 UNIT/ML ~~LOC~~ SUSP
20.0000 [IU] | Freq: Two times a day (BID) | SUBCUTANEOUS | Status: DC
  Administered 2014-03-22 (×2): 20 [IU] via SUBCUTANEOUS
  Filled 2014-03-22: qty 10

## 2014-03-22 MED ORDER — LEVOFLOXACIN IN D5W 750 MG/150ML IV SOLN
750.0000 mg | Freq: Every day | INTRAVENOUS | Status: DC
  Administered 2014-03-22 (×2): 750 mg via INTRAVENOUS
  Filled 2014-03-22 (×3): qty 150

## 2014-03-22 NOTE — Progress Notes (Signed)
ANTIBIOTIC CONSULT NOTE - INITIAL  Pharmacy Consult for levofloxacin Indication: SIRS  Allergies  Allergen Reactions  . Ace Inhibitors Other (See Comments)    Cough   ( Lisinopril )  . Adhesive [Tape] Itching    Patient Measurements: Height: 5\' 8"  (172.7 cm) Weight: 214 lb 11.7 oz (97.4 kg) IBW/kg (Calculated) : 63.9 Adjusted Body Weight:   Vital Signs: Temp: 97.9 F (36.6 C) (07/15 2346) Temp src: Oral (07/15 2346) BP: 110/80 mmHg (07/16 0200) Pulse Rate: 85 (07/16 0200) Intake/Output from previous day: 07/15 0701 - 07/16 0700 In: 1168.3 [I.V.:1018.3; IV Piggyback:150] Out: -  Intake/Output from this shift: Total I/O In: 1168.3 [I.V.:1018.3; IV Piggyback:150] Out: -   Labs:  Recent Labs  03/21/14 2147 03/21/14 2229 03/22/14 0152 03/22/14 0352  WBC 7.5  --   --   --   HGB 15.4* 17.3*  --   --   PLT 175  --   --   --   CREATININE 1.56* 1.80* 1.49* 1.32*   Estimated Creatinine Clearance: 64.3 ml/min (by C-G formula based on Cr of 1.32). No results found for this basename: VANCOTROUGH, Leodis BinetVANCOPEAK, VANCORANDOM, GENTTROUGH, GENTPEAK, GENTRANDOM, TOBRATROUGH, TOBRAPEAK, TOBRARND, AMIKACINPEAK, AMIKACINTROU, AMIKACIN,  in the last 72 hours   Microbiology: Recent Results (from the past 720 hour(s))  MRSA PCR SCREENING     Status: Abnormal   Collection Time    03/21/14 11:51 PM      Result Value Ref Range Status   MRSA by PCR POSITIVE (*) NEGATIVE Final   Comment:            The GeneXpert MRSA Assay (FDA     approved for NASAL specimens     only), is one component of a     comprehensive MRSA colonization     surveillance program. It is not     intended to diagnose MRSA     infection nor to guide or     monitor treatment for     MRSA infections.     RESULT CALLED TO, READ BACK BY AND VERIFIED WITH:     C.DENNY,RN AT 0221 ON 03/22/14 BY [facility]    Medical History: Past Medical History  Diagnosis Date  . Hypertension   . Hypothyroid   . Depression   .  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 Kidney disease   . End stage renal disease     right arm AV graft, post transplant  . Diabetes mellitus     Insulin dependant    Medications:  Anti-infectives   Start     Dose/Rate Route Frequency Ordered Stop   03/22/14 0130  levofloxacin (LEVAQUIN) IVPB 750 mg     750 mg 100 mL/hr over 90 Minutes Intravenous Daily at bedtime 03/22/14 0128       Assessment: Patient with SIRS.  First dose of antibiotics already given.  Goal of Therapy:  Levofloxacin dosed based on patient weight and renal function     Plan: Levofloxacin 750mg  iv q24hr Follow up culture results  Aleene DavidsonGrimsley Jr, Caly Pellum Crowford 03/22/2014,5:03 AM

## 2014-03-22 NOTE — Progress Notes (Signed)
[facility] PROGRESS NOTE  [name]:[id number] DOB: 29-Oct-1965 DOA: 03/21/2014 PCP: [name], MD  Assessment/Plan: #1 DKA/DM Question of the etiology. Patient states she's been compliant with her medications. Urinalysis which was done on admission was negative. Will check an acute abdominal series. Check EKG. Cycle cardiac enzymes every 6 hours x3. Anion gap at 13. Check a hemoglobin A1c. Will transition patient from glucose stabilized to home regimen of insulin 70/30. Will place on a sliding scale insulin. Follow.  #2 hypotension Questionable etiology. May be secondary to volume depletion. Blood pressure with some improvement with hydration. Will get an acute abdominal series. Urinalysis was negative. 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 Cycle cardiac enzymes every 6 hours x3. Continue IV fluids. Follow.  #3 acute renal failure/status post kidney transplant in 2011 Likely secondary to prerenal azotemia as patient was noted to be hypotensive on admission. Renal function improved with hydration. Follow urine output. Continue prednisone and CellCept and Prograf. Follow.  #4 hypothyroidism Continue Synthroid.   #5 ??SIRS Patient noted to be hypotensive on admission and had an elevated lactic acid level which may have been secondary to dehydration. Repeat lactic acid level. Check an acute abdominal series. Urinalysis was negative. Patient is currently afebrile. Continue empiric Levaquin and if results continue to be negative may treat empirically for a total of 5 days.  #6 nausea and vomiting Likely secondary to problem #1. Resolved. Patient has been started on a diet I will monitor.  #7 prophylaxis Pepcid for GI prophylaxis. Heparin for DVT prophylaxis.  Code Status: Full Family Communication: Updated patient no family at bedside. Disposition Plan: Home when medically stable.   Consultants:  None  Procedures:  None  Antibiotics:  IV  Levaquin 03/22/2014  HPI/Subjective: Patient denies any nausea, no emesis, no abdominal pain. Patient denies any chest pain. No shortness of breath. No complaints.  Objective: Filed Vitals:   03/22/14 0400  BP: 109/43  Pulse: 79  Temp: 97.2 F (36.2 C)  Resp: 16    Intake/Output Summary (Last 24 hours) at 03/22/14 0847 Last data filed at 03/22/14 0616  Gross per 24 hour  Intake 1394.2 ml  Output      0 ml  Net 1394.2 ml   Filed Weights   03/21/14 2119 03/21/14 2346  Weight: 104.327 kg (230 lb) 97.4 kg (214 lb 11.7 oz)    Exam:   General:  NAD  Cardiovascular: RRR  Respiratory: CTAB  Abdomen: Obese, soft, nontender, nondistended, positive bowel sounds.  Musculoskeletal: No clubbing cyanosis or edema.  Data Reviewed: Basic Metabolic Panel:  Recent Labs Lab 03/21/14 2147 03/21/14 2229 03/22/14 0152 03/22/14 0352 03/22/14 0612  NA 134* 131* 136* 137 137  K 4.9 5.4* 4.2 3.9 3.9  CL 92* 99 101 103 102  CO2 16*  --  22 21 22   GLUCOSE 483* 461* 310* 197* 133*  BUN 38* 55* 36* 33* 33*  CREATININE 1.56* 1.80* 1.49* 1.32* 1.16*  CALCIUM 10.6*  --  9.4 9.2 9.6   Liver Function Tests:  Recent Labs Lab 03/21/14 2147  AST 14  ALT 13  ALKPHOS 120*  BILITOT 0.8  PROT 7.5  ALBUMIN 3.3*   No results found for this basename: LIPASE, AMYLASE,  in the last 168 hours No results found for this basename: AMMONIA,  in the last 168 hours CBC:  Recent Labs Lab 03/21/14 2147 03/21/14 2229  WBC 7.5  --   NEUTROABS 5.5  --   HGB  15.4* 17.3*  HCT 47.0* 51.0*  MCV 91.1  --   PLT 175  --    Cardiac Enzymes: No results found for this basename: CKTOTAL, CKMB, CKMBINDEX, TROPONINI,  in the last 168 hours BNP (last 3 results) No results found for this basename: PROBNP,  in the last 8760 hours CBG:  Recent Labs Lab 03/22/14 0201 03/22/14 0310 03/22/14 0407 03/22/14 0506 03/22/14 0614  GLUCAP 267* 219* 176* 158* 130*    Recent Results (from the past 240  hour(s))  MRSA PCR SCREENING     Status: Abnormal   Collection Time    03/21/14 11:51 PM      Result Value Ref Range Status   MRSA by PCR POSITIVE (*) NEGATIVE Final   Comment:            The GeneXpert MRSA Assay (FDA     approved for NASAL specimens     only), is one component of a     comprehensive MRSA colonization     surveillance program. It is not     intended to diagnose MRSA     infection nor to guide or     monitor treatment for     MRSA infections.     RESULT CALLED TO, READ BACK BY AND VERIFIED WITH:     C.DENNY,RN AT 0221 ON 03/22/14 BY [facility]     Studies: No results found.  Scheduled Meds: . carvedilol  12.5 mg Oral BID  . Chlorhexidine Gluconate Cloth  6 each Topical Q0600  . famotidine  10 mg Oral Daily  . heparin  5,000 Units Subcutaneous 3 times per day  . insulin aspart protamine- aspart  20 Units Subcutaneous BID WC  . levofloxacin (LEVAQUIN) IV  750 mg Intravenous QHS  . levothyroxine  137 mcg Oral QAC breakfast  . multivitamin with minerals  1 tablet Oral Daily  . mupirocin ointment  1 application Nasal BID  . mycophenolate  500 mg Oral BID  . predniSONE  5 mg Oral Daily  . tacrolimus  1 mg Oral BID   Continuous Infusions: . sodium chloride    . sodium chloride    . sodium chloride 150 mL/hr at 03/22/14 0144  . dextrose 5 % and 0.45% NaCl    . dextrose 5 % and 0.45% NaCl 100 mL/hr at 03/22/14 0318  . insulin (NOVOLIN-R) infusion 0.5 Units/hr (03/22/14 16100722)    Principal Problem:   DKA (diabetic ketoacidoses) Active Problems:   Hypotension, unspecified   HYPERTENSION   Hypothyroidism   H/O kidney transplant   Renal failure, acute   Dehydration with hyponatremia   Diabetes mellitus   Nausea with vomiting    Time spent: 40 mins    [facility] LLCHOMPSON,Cornell Gaber MD [facility] Pager [phone]. If 7PM-7AM, please contact night-coverage at [email or web address], password [email or web address] 03/22/2014, 8:47 AM  LOS: 1 day

## 2014-03-22 NOTE — Progress Notes (Signed)
Inpatient Diabetes Program Recommendations  AACE/ADA: New Consensus Statement on Inpatient Glycemic Control (2013)  Target Ranges:  Prepandial:   less than 140 mg/dL      Peak postprandial:   less than 180 mg/dL (1-2 hours)      Critically ill patients:  140 - 180 mg/dL   Reason for Visit: DKAadmission  Diabetes history: Type1DM Outpatient Diabetes medications: 70/30 20 units bid Current orders for Inpatient glycemic control: 70/30 20 units bid, Novolog sensitive tidwc  48 y.o. 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 Pt states her blood sugars have been high for over a week, but she ran out of strips and has been unable to check it. States she has transportation problems and occasionally misses doctor appt if no ride is available. Pt gets generic 70/30 insulin from [facility]. No PCP to manage DM. Insulin has not been adjusted in awhile. Does not need prescription to purchase insulin. States blood sugars usuallly run in the 400s. Rarely has hypoglycemia  GlucoStabilizer has been discontinued and 70/30 20 units given prior to discontinuation of drip. Ate lunch - 100%. Needs PCP to manage DM. Case manager consult. May benefit from increasing insulin doseage.  Inpatient Diabetes Program Recommendations Insulin - Basal: Consider increasing70/30 to 25 units bid  Note: Will follow-up in am. Discussed above with RN. Thank you. Ailene Ardshonda Reality Dejonge, RD, LDN, CDE Inpatient Diabetes Coordinator [phone]

## 2014-03-22 NOTE — H&P (Addendum)
[facility]                                                                                    Patient Demographics  [name], is a 48 y.o. female  CSN: [id number]  MRN: [id number]  DOB - 09-07-1966  Admit Date - 03/21/2014  Outpatient Primary MD for the patient is [name], MD   With History of -  Past Medical History  Diagnosis Date  . Hypertension   . Hypothyroid   . Depression   . 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 Kidney disease   . End stage renal disease     right arm AV graft, post transplant  . Diabetes mellitus     Insulin dependant      Past Surgical History  Procedure Laterality Date  . Back surgery  2001  . Tubal ligation    . Dg av dialysis graft declot or    . Nephrectomy transplanted organ    . Kidney transplant  December 16, 2009  . 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  . Cataract extraction w/ intraocular lens implant Right     in for   Chief Complaint  Patient presents with  . Weakness     HPI  [name]  is a 49 y.o. female, with past medical history significant for diabetes mellitus and history of kidney transplant on steroids and antirejection medication, presenting today with a generalized weakness,  and multiple episodes of vomiting. No reports of fever or chills. No urinary symptoms, no cough or shortness of breath. Patient reports that she has been compliant with her medications. Patient reports multiple episodes of DKA since the kidney transplant.    Review of Systems    In addition to the HPI above,  No Fever-chills, No Headache, No changes with Vision or hearing, No problems swallowing food or Liquids, No Chest pain, Cough or Shortness of Breath, No Abdominal pain, No Nausea or Vommitting, Bowel movements are regular, No Blood in stool or Urine, No dysuria, No new skin rashes or bruises, No new joints pains-aches,  No new weakness, tingling, numbness in any  extremity, No recent weight gain or loss, No polyuria, polydypsia or polyphagia, No significant Mental Stressors.  A full 10 point Review of Systems was done, except as stated above, all other Review of Systems were negative.   Social History History  Substance Use Topics  . Smoking status: Former Smoker -- 1.00 packs/day for 11 years    Types: Cigarettes    Quit date: 09/21/1998  . Smokeless tobacco: Never Used  . Alcohol Use: No     Family History Family History  Problem Relation Age of Onset  . Emphysema Mother   . Throat cancer Mother   . COPD Mother   . Cancer Mother   . Emphysema Father   . COPD Father   . Stroke Father   . ADD / ADHD Son      Prior to Admission medications   Medication Sig Start Date End Date Taking? Authorizing Provider  acetaminophen (TYLENOL) 500 MG tablet Take 500 mg by mouth every 6 (six)  hours as needed for mild pain.    Yes Historical Provider, MD  amLODipine (NORVASC) 5 MG tablet Take 1 tablet (5 mg total) by mouth daily. 08/08/12  Yes Alison MurrayAlma M Devine, MD  carvedilol (COREG) 25 MG tablet Take 2 tablets (50 mg total) by mouth 2 (two) times daily. 08/08/12  Yes Alison MurrayAlma M Devine, MD  insulin aspart protamine- aspart (NOVOLOG MIX 70/30) (70-30) 100 UNIT/ML injection Inject 0.2 mLs (20 Units total) into the skin 2 (two) times daily with a meal. 08/06/13  Yes Adeline C Viyuoh, MD  levothyroxine (SYNTHROID, LEVOTHROID) 137 MCG tablet Take 137 mcg by mouth daily. Name brand   Yes Historical Provider, MD  Multiple Vitamin (MULTIVITAMIN WITH MINERALS) TABS tablet Take 1 tablet by mouth daily.   Yes Historical Provider, MD  mycophenolate (CELLCEPT) 250 MG capsule Take 500 mg by mouth 2 (two) times daily.    Yes Historical Provider, MD  predniSONE (DELTASONE) 5 MG tablet Take 5 mg by mouth daily.     Yes Historical Provider, MD  ranitidine (ZANTAC) 150 MG capsule Take 150 mg by mouth daily.    Yes Historical Provider, MD  tacrolimus (PROGRAF) 1 MG capsule Take  1 mg by mouth 2 (two) times daily.    Yes Historical Provider, MD    Allergies  Allergen Reactions  . Ace Inhibitors Other (See Comments)    Cough   ( Lisinopril )  . Adhesive [Tape] Itching    Physical Exam  Vitals  Blood pressure 112/52, pulse 68, temperature 97.9 F (36.6 C), temperature source Oral, resp. rate 18, height 5\' 8"  (1.727 m), weight 97.4 kg (214 lb 11.7 oz), last menstrual period 11/19/2011, SpO2 97.00%.   1. General in no acute distress  2. Normal affect and insight, Not Suicidal or Homicidal, Awake Alert, Oriented X 3.  3. No F.N deficits, ALL C.Nerves Intact, Strength 5/5 all 4 extremities,  4. Ears and Eyes appear Normal, Conjunctivae clear, PERRLA. Moist Oral Mucosa.  5. Supple Neck, No JVD, No cervical lymphadenopathy appriciated, No Carotid Bruits.  6. Symmetrical Chest wall movement, Good air movement bilaterally, CTAB.  7. RRR, No Gallops, Rubs or Murmurs, No Parasternal Heave.  8. Positive Bowel Sounds, Abdomen Soft, Non tender, No organomegaly appriciated,No rebound -guarding or rigidity.  9.  No Cyanosis, Normal Skin Turgor, No Skin Rash or Bruise.  10. Good muscle tone,  joints appear normal , status post toe amputations on the left.  11. No Palpable Lymph Nodes in Neck or Axillae    Data Review  CBC  Recent Labs Lab 03/21/14 2147 03/21/14 2229  WBC 7.5  --   HGB 15.4* 17.3*  HCT 47.0* 51.0*  PLT 175  --   MCV 91.1  --   MCH 29.8  --   MCHC 32.8  --   RDW 13.0  --   LYMPHSABS 1.1  --   MONOABS 0.9  --   EOSABS 0.0  --   BASOSABS 0.0  --    ------------------------------------------------------------------------------------------------------------------  Chemistries   Recent Labs Lab 03/21/14 2147 03/21/14 2229  NA 134* 131*  K 4.9 5.4*  CL 92* 99  CO2 16*  --   GLUCOSE 483* 461*  BUN 38* 55*  CREATININE 1.56* 1.80*  CALCIUM 10.6*  --   AST 14  --   ALT 13  --   ALKPHOS 120*  --   BILITOT 0.8  --     ------------------------------------------------------------------------------------------------------------------ estimated creatinine clearance is 47.1 ml/min (by C-G formula  based on Cr of 1.8). ------------------------------------------------------------------------------------------------------------------ No results found for this basename: TSH, T4TOTAL, FREET3, T3FREE, THYROIDAB,  in the last 72 hours   Coagulation profile No results found for this basename: INR, PROTIME,  in the last 168 hours ------------------------------------------------------------------------------------------------------------------- No results found for this basename: DDIMER,  in the last 72 hours -------------------------------------------------------------------------------------------------------------------  Cardiac Enzymes No results found for this basename: CK, CKMB, TROPONINI, MYOGLOBIN,  in the last 168 hours ------------------------------------------------------------------------------------------------------------------ No components found with this basename: POCBNP,    ---------------------------------------------------------------------------------------------------------------  Urinalysis    Component Value Date/Time   COLORURINE YELLOW 03/21/2014 2211   APPEARANCEUR CLEAR 03/21/2014 2211   LABSPEC 1.029 03/21/2014 2211   PHURINE 5.0 03/21/2014 2211   GLUCOSEU >1000* 03/21/2014 2211   HGBUR TRACE* 03/21/2014 2211   BILIRUBINUR MODERATE* 03/21/2014 2211   KETONESUR 40* 03/21/2014 2211   PROTEINUR NEGATIVE 03/21/2014 2211   UROBILINOGEN 0.2 03/21/2014 2211   NITRITE NEGATIVE 03/21/2014 2211   LEUKOCYTESUR NEGATIVE 03/21/2014 2211    ----------------------------------------------------------------------------------------------------------------  Imaging results:   No results found.   Assessment & Plan   1. diabetic ketoacidosis 2. Hypotension/?SIRS 3. Renal insufficiency 4. Status  post kidney transplant in 2011 on prednisone and anti ejection medications 5. Mild hypokalemia  Plan  Admit to step down  DKA protocol started with IV insulin IV fluids, normal saline Levaquin IV   DVT Prophylaxis Heparin  AM Labs Ordered, also please review Full Orders  Code Status full  Disposition Plan: Home  Time spent in minutes : 33 minutes  Condition GUARDED   @SIGNATURE @

## 2014-03-22 NOTE — Progress Notes (Signed)
INITIAL NUTRITION ASSESSMENT  DOCUMENTATION CODES Per approved criteria  -Obesity Unspecified   INTERVENTION: - Glucerna shakes BID - Reviewed importance of diabetic diet compliance - RD to monitor plan of care   NUTRITION DIAGNOSIS: Inadequate oral intake related to poor appetite as evidenced by pt report.   Goal: Pt to consume >90% of meals/supplements  Monitor:  Weights, labs, intake   Reason for Assessment: Malnutrition screening tool   48 y.o. female  Admitting Dx: DKA (diabetic ketoacidoses)  ASSESSMENT: Pt with past medical history significant for diabetes mellitus and history of kidney transplant on steroids and antirejection medication, presenting today with a generalized weakness, and multiple episodes of vomiting. No reports of fever or chills. No urinary symptoms, no cough or shortness of breath. Patient reports that she has been compliant with her medications. Patient reports multiple episodes of DKA since the kidney transplant.  - Pt reports being NPO for 2 days r/t vomiting every time she would try to eat/drink, even water - Before then she had a good appetite, was eating 3 meals/day and snacks, trying to stick to diabetic diet  - States she weighed 245 pounds 3 months ago, now weighs 214 pounds - unsure what could be causing this other than stress - Weight trend shows pt's weight down 10% in the past year - not significant  - Reports the diarrhea/vomiting have resolved - Ate just some grapes and bacon for breakfast this morning - States her blood sugars were up and down at home - Performed nutrition focused physical exam which was WNL except for mild wasting in upper arms  Alk phos elevated  Lab Results  Component Value Date   HGBA1C 11.5* 03/22/2014    Height: Ht Readings from Last 1 Encounters:  03/21/14 '5\' 8"'  (1.727 m)    Weight: Wt Readings from Last 1 Encounters:  03/21/14 214 lb 11.7 oz (97.4 kg)    Ideal Body Weight: 140 lbs   % Ideal  Body Weight: 153%  Wt Readings from Last 10 Encounters:  03/21/14 214 lb 11.7 oz (97.4 kg)  08/05/13 217 lb (98.431 kg)  06/07/13 223 lb (101.152 kg)  03/07/13 240 lb 11.9 oz (109.2 kg)  01/06/13 236 lb 5.3 oz (107.2 kg)  10/24/12 242 lb (109.77 kg)  08/08/12 243 lb 4.8 oz (110.36 kg)  07/04/12 244 lb (110.678 kg)  02/05/12 243 lb 11.2 oz (110.542 kg)  11/20/11 260 lb (117.935 kg)    Usual Body Weight: 245 lbs per pt  % Usual Body Weight: 87%  BMI:  Body mass index is 32.66 kg/(m^2). Class I obesity   Estimated Nutritional Needs: Kcal: 1600-1800 Protein: 70-90g Fluid: 1.6-1.8L/day   Skin: Intact   Diet Order: Carb Control  EDUCATION NEEDS: -No education needs identified at this time   Intake/Output Summary (Last 24 hours) at 03/22/14 1533 Last data filed at 03/22/14 1200  Gross per 24 hour  Intake 2177.26 ml  Output      0 ml  Net 2177.26 ml    Last BM: 7/15  Labs:   Recent Labs Lab 03/22/14 0152 03/22/14 0352 03/22/14 0612  NA 136* 137 137  K 4.2 3.9 3.9  CL 101 103 102  CO2 '22 21 22  ' BUN 36* 33* 33*  CREATININE 1.49* 1.32* 1.16*  CALCIUM 9.4 9.2 9.6  GLUCOSE 310* 197* 133*    CBG (last 3)   Recent Labs  03/22/14 0855 03/22/14 1010 03/22/14 1201  GLUCAP 116* 153* 164*    Scheduled Meds: .  carvedilol  12.5 mg Oral BID  . Chlorhexidine Gluconate Cloth  6 each Topical Q0600  . famotidine  10 mg Oral Daily  . heparin  5,000 Units Subcutaneous 3 times per day  . insulin aspart  0-9 Units Subcutaneous TID WC  . insulin aspart protamine- aspart  20 Units Subcutaneous BID WC  . levofloxacin (LEVAQUIN) IV  750 mg Intravenous QHS  . levothyroxine  137 mcg Oral QAC breakfast  . multivitamin with minerals  1 tablet Oral Daily  . mupirocin ointment  1 application Nasal BID  . mycophenolate  500 mg Oral BID  . predniSONE  5 mg Oral Daily  . tacrolimus  1 mg Oral BID    Continuous Infusions: . sodium chloride    . sodium chloride    .  sodium chloride 150 mL/hr at 03/22/14 0144  . dextrose 5 % and 0.45% NaCl    . dextrose 5 % and 0.45% NaCl 100 mL/hr at 03/22/14 1440  . insulin (NOVOLIN-R) infusion 0.9 Units/hr (03/22/14 1010)    Past Medical History  Diagnosis Date  . Hypertension   . Hypothyroid   . Depression   . 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 Kidney disease   . End stage renal disease     right arm AV graft, post transplant  . Diabetes mellitus     Insulin dependant    Past Surgical History  Procedure Laterality Date  . Back surgery  2001  . Tubal ligation    . Dg av dialysis graft declot or    . Nephrectomy transplanted organ    . Kidney transplant  December 16, 2009  . 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  . Cataract extraction w/ intraocular lens implant Right     Carlis Stable MS, RD, LDN [phone] Pager [phone] Weekend/After Hours Pager

## 2014-03-22 NOTE — Progress Notes (Signed)
CARE MANAGEMENT NOTE 03/22/2014  Patient:  [name],[name]   Account Number:  [id number]  Date Initiated:  03/22/2014  Documentation initiated by:  DAVIS,RHONDA  Subjective/Objective Assessment:   dka requiring iv insulin     Action/Plan:   home when stable/pt does have a pcp/   Anticipated DC Date:  03/25/2014   Anticipated DC Plan:  HOME/SELF CARE  In-house referral  NA      DC Planning Services  NA      [facility]  NA   Choice offered to / List presented to:  NA   DME arranged  NA      DME agency  NA     HH arranged  NA      HH agency  NA   Status of service:  In process, will continue to follow Medicare Important Message given?  NA - LOS <3 / Initial given by admissions (If response is "NO", the following Medicare IM given date fields will be blank) Date Medicare IM given:   Medicare IM given by:   Date Additional Medicare IM given:   Additional Medicare IM given by:    Discharge Disposition:    Per UR Regulation:  Reviewed for med. necessity/level of care/duration of stay  If discussed at Long Length of Stay Meetings, dates discussed:    Comments:  [id number]/Rhonda Earlene Plateravis, RN, BSN, CCM: CHART REVIEWED AND UPDATED.  Next chart review due on [id number]. NO DISCHARGE NEEDS PRESENT AT THIS TIME WILL CONTINUE TO FOLLOW. CASE MANAGEMENT [phone]

## 2014-03-22 NOTE — ED Provider Notes (Signed)
Medical screening examination/treatment/procedure(s) were conducted as a shared visit with non-physician practitioner(s) and myself.  I personally evaluated the patient during the encounter.   EKG Interpretation None     EKG reviewed and documented in PA note.  Patient presents with generalized weakness, orhostasis, v/d x 3 days. Hx of IDDM and renal transplant. CBG elevated into the 400's.  Profoundly orthostatic otherwise ill-appearing bu nontoxic.  NO signs of peritonitis.  APpears dry.  Patient given fluids.  Evidence of AKI with acute elevation in creatinine.  Also with evidence of DKA on CMP.  Patient aggressively fluid resuscitator and started on glucostabilizer.  Patient will be admitted.  CRITICAL CARE Performed by: Ross MarcusHORTON, COURTNEY, F   Total critical care time: 35 min  Critical care time was exclusive of separately billable procedures and treating other patients.  Critical care was necessary to treat or prevent imminent or life-threatening deterioration.  Critical care was time spent personally by me on the following activities: development of treatment plan with patient and/or surrogate as well as nursing, discussions with consultants, evaluation of patient's response to treatment, examination of patient, obtaining history from patient or surrogate, ordering and performing treatments and interventions, ordering and review of laboratory studies, ordering and review of radiographic studies, pulse oximetry and re-evaluation of patient's condition.    Shon Batonourtney F Horton, MD 03/22/14 [phone]

## 2014-03-23 ENCOUNTER — Inpatient Hospital Stay (HOSPITAL_COMMUNITY): Payer: Medicare Other

## 2014-03-23 DIAGNOSIS — I959 Hypotension, unspecified: Secondary | ICD-10-CM

## 2014-03-23 LAB — CBC
HCT: 42.3 % (ref 36.0–46.0)
Hemoglobin: 13.5 g/dL (ref 12.0–15.0)
MCH: 29.3 pg (ref 26.0–34.0)
MCHC: 31.9 g/dL (ref 30.0–36.0)
MCV: 91.8 fL (ref 78.0–100.0)
Platelets: 120 10*3/uL — ABNORMAL LOW (ref 150–400)
RBC: 4.61 MIL/uL (ref 3.87–5.11)
RDW: 13.1 % (ref 11.5–15.5)
WBC: 4.5 10*3/uL (ref 4.0–10.5)

## 2014-03-23 LAB — BASIC METABOLIC PANEL
ANION GAP: 8 (ref 5–15)
BUN: 20 mg/dL (ref 6–23)
CALCIUM: 10.1 mg/dL (ref 8.4–10.5)
CO2: 24 mEq/L (ref 19–32)
Chloride: 102 mEq/L (ref 96–112)
Creatinine, Ser: 1.29 mg/dL — ABNORMAL HIGH (ref 0.50–1.10)
GFR calc Af Amer: 56 mL/min — ABNORMAL LOW (ref >90)
GFR calc non Af Amer: 49 mL/min — ABNORMAL LOW (ref >90)
GLUCOSE: 285 mg/dL — AB (ref 70–99)
Potassium: 4.3 mEq/L (ref 3.7–5.3)
SODIUM: 134 meq/L — AB (ref 137–147)

## 2014-03-23 LAB — GLUCOSE, CAPILLARY
GLUCOSE-CAPILLARY: 295 mg/dL — AB (ref 70–99)
Glucose-Capillary: 165 mg/dL — ABNORMAL HIGH (ref 70–99)

## 2014-03-23 LAB — MAGNESIUM: Magnesium: 1.7 mg/dL (ref 1.5–2.5)

## 2014-03-23 MED ORDER — INSULIN ASPART PROT & ASPART (70-30 MIX) 100 UNIT/ML ~~LOC~~ SUSP: 25.0000 [IU] | Freq: Two times a day (BID) | SUBCUTANEOUS | Status: DC

## 2014-03-23 NOTE — Progress Notes (Signed)
CARE MANAGEMENT NOTE 03/23/2014  Patient:  [name]   Account Number:  [id number]  Date Initiated:  03/22/2014  Documentation initiated by:  DAVIS,RHONDA  Subjective/Objective Assessment:   dka requiring iv insulin     Action/Plan:   home when stable/pt does have a pcp/   Anticipated DC Date:  03/25/2014   Anticipated DC Plan:  HOME/SELF CARE  In-house referral  NA      DC Planning Services  CM consult  Follow-up appt scheduled      PAC Choice  NA   Choice offered to / List presented to:  NA   DME arranged  NA      DME agency  NA     HH arranged  NA      HH agency  NA   Status of service:  In process, will continue to follow Medicare Important Message given?  NA - LOS <3 / Initial given by admissions (If response is "NO", the following Medicare IM given date fields will be blank) Date Medicare IM given:   Medicare IM given by:   Date Additional Medicare IM given:  03/23/2014 Additional Medicare IM given by:  [name]  Discharge Disposition:  HOME/SELF CARE  Per UR Regulation:  Reviewed for med. necessity/level of care/duration of stay  If discussed at Long Length of Stay Meetings, dates discussed:    Comments:  03/23/14 MMCGIBBONEY, RN, BSN Follow up [name] 03/27/[name].   [id number]/Rhonda Earlene Plateravis, RN, BSN, CCM: CHART REVIEWED AND UPDATED.  Next chart review due on [id number]. NO DISCHARGE NEEDS PRESENT AT THIS TIME WILL CONTINUE TO FOLLOW. CASE MANAGEMENT [phone]

## 2014-03-23 NOTE — Discharge Summary (Signed)
Physician Discharge Summary  [name] OZH:[id number] DOB: 1966/09/05 DOA: 03/21/2014  PCP: [name], MD  Admit date: 03/21/2014 Discharge date: 03/23/2014  Time spent: 65 minutes  Recommendations for Outpatient Follow-up:  1. Patient is to followup at the [facility] for primary care management of her uncontrolled diabetes. Also for management of her chronic medical issues. On followup a basic metabolic profile need to be obtained to followup on patient's electrolytes and renal function. 2. Followup with Dr. Arrie Aran of nephrology in 1-2 weeks.  Discharge Diagnoses:  Principal Problem:   DKA (diabetic ketoacidoses) Active Problems:   Hypotension, unspecified   HYPERTENSION   Hypothyroidism   H/O kidney transplant   Renal failure, acute   Dehydration with hyponatremia   Diabetes mellitus   Nausea with vomiting   Discharge Condition: Stable and improved  Diet recommendation: Carb modified  Filed Weights   03/21/14 2119 03/21/14 2346 03/23/14 0654  Weight: 104.327 kg (230 lb) 97.4 kg (214 lb 11.7 oz) 100.8 kg (222 lb 3.6 oz)    History of present illness:  [name] is a 48 y.o. female, with past medical history significant for diabetes mellitus and history of kidney transplant on steroids and antirejection medication, who presented on the day of admission with a generalized weakness, and multiple episodes of vomiting. Patient on admission denied any  fever or chills. No urinary symptoms, no cough or shortness of breath. Patient reported that she had been compliant with her medications. Patient reported multiple episodes of DKA since the kidney transplant.      Hospital Course:  #1 DKA/DM  Question of the etiology. Patient stated she had  been compliant with her medications. Urinalysis which was done on admission was negative. Acute abdominal series abnormal source of infection. EKG showed old right bundle branch block. Cardiac enzymes  which was cycled were negative x3. Patient was hydrated with IV fluids was placed in the glucose stabilized and monitored. Once patient's anion gap had closed he was transitioned to a home regimen of insulin 70/30. Patient was subsequently started on a diet which she tolerated. Patient was also maintained on a sliding scale insulin. Patient did not have any further nausea or vomiting. Patient improved clinically. Hemoglobin A1c which was obtained came back elevated at 11.5. Patient's home regimen of 70/30 doses were adjusted and she'll be discharged home on 25 units twice daily. Patient is to followup with PCP as outpatient for further management. #2 hypotension  Questionable etiology. Likely secondary to volume depletion. Cardiac enzymes which was cycled were negative x3. EKG which was done showed old right bundle branch block. Acute abdominal series which was obtained was negative for any acute abnormalities leading to hypotension. Urinalysis which was done was negative. Patient was hydrated with IV fluids with resolution of her hypotension.  #3 acute renal failure/status post kidney transplant in 2011  Likely secondary to prerenal azotemia as patient was noted to be hypotensive on admission. Patient was hydrated IV fluids and antihypertensive medications held. Patient's renal function improved on a daily basis. Patient also had good urine output. Patient was maintained on her home regimen of prednisone and CellCept and Prograf. Outpatient followup. #4 hypothyroidism  Continued on home regimen of Synthroid. Outpatient followup. #5 ??SIRS  Patient noted to be hypotensive on admission and had an elevated lactic acid level which may have been secondary to dehydration. Repeat lactic acid level trended down. Acute abdominal series which was done did not show any acute abnormalities. Urinalysis which was done  was negative. Cardiac enzymes which was cycled were negative x3. Patient has been placed empirically on  IV Levaquin. Patient improved clinically and be discharged home in stable condition.  #6 nausea and vomiting  Likely secondary to problem #1. Patient was placed in the step down unit hydrate with IV fluids and placed on the glucose stabilizer. Patient's nausea and vomiting improved. Abdominal x-rays which were done were negative for any obstruction. Patient prior to admission did state she had some diarrhea which resolved. Once patient diabetic ketoacidosis had resolved patient was started on clear liquids which she tolerated. Patient subsequently advanced and she was tolerating a, fundi by day of discharge. Patient did not have any further nausea or emesis and patient be discharged in stable and improved condition.      Procedures:  None  Consultations:  None  Discharge Exam: Filed Vitals:   03/23/14 1405  BP: 145/78  Pulse: 73  Temp: 98 F (36.7 C)  Resp: 18    General: NAD Cardiovascular: RRR Respiratory: CTAB  Discharge Instructions You were cared for by a hospitalist during your hospital stay. If you have any questions about your discharge medications or the care you received while you were in the hospital after you are discharged, you can call the unit and asked to speak with the hospitalist on call if the hospitalist that took care of you is not available. Once you are discharged, your primary care physician will handle any further medical issues. Please note that NO REFILLS for any discharge medications will be authorized once you are discharged, as it is imperative that you return to your primary care physician (or establish a relationship with a primary care physician if you do not have one) for your aftercare needs so that they can reassess your need for medications and monitor your lab values.      Discharge Instructions   Diet Carb Modified    Complete by:  As directed      Discharge instructions    Complete by:  As directed   Follow up with [facility] as scheduled. Follow up with Dr Arrie Aran in 1-2 weeks.     Increase activity slowly    Complete by:  As directed             Medication List         acetaminophen 500 MG tablet  Commonly known as:  TYLENOL  Take 500 mg by mouth every 6 (six) hours as needed for mild pain.     amLODipine 5 MG tablet  Commonly known as:  NORVASC  Take 1 tablet (5 mg total) by mouth daily.     carvedilol 25 MG tablet  Commonly known as:  COREG  Take 2 tablets (50 mg total) by mouth 2 (two) times daily.     insulin aspart protamine- aspart (70-30) 100 UNIT/ML injection  Commonly known as:  NOVOLOG MIX 70/30  Inject 0.25 mLs (25 Units total) into the skin 2 (two) times daily with a meal.     levothyroxine 137 MCG tablet  Commonly known as:  SYNTHROID, LEVOTHROID  Take 137 mcg by mouth daily. Name brand     multivitamin with minerals Tabs tablet  Take 1 tablet by mouth daily.     mycophenolate 250 MG capsule  Commonly known as:  CELLCEPT  Take 500 mg by mouth 2 (two) times daily.     predniSONE 5 MG tablet  Commonly known as:  DELTASONE  Take 5 mg  by mouth daily.     ranitidine 150 MG capsule  Commonly known as:  ZANTAC  Take 150 mg by mouth daily.     tacrolimus 1 MG capsule  Commonly known as:  PROGRAF  Take 1 mg by mouth 2 (two) times daily.       Allergies  Allergen Reactions  . Ace Inhibitors Other (See Comments)    Cough   ( Lisinopril )  . Adhesive [Tape] Itching   Follow-up Information   Follow up with [facility]. (appointment at 10:00 AM)    Contact information:   [address] [place] [address] [phone]      Follow up with COLADONATO,JOSEPH A, MD. Schedule an appointment as soon as possible for a visit in 1 week. (f/u in 1-2 weeks. )    Specialty:  Nephrology   Contact information:   [address] [place] [address] [phone]        The results of significant diagnostics from this  hospitalization (including imaging, microbiology, ancillary and laboratory) are listed below for reference.    Significant Diagnostic Studies: Dg Abd 1 View  03/23/2014   CLINICAL DATA:  Abdominal pain  EXAM: ABDOMEN - 1 VIEW  COMPARISON:  03/22/2014  FINDINGS: Several mildly distended left mid abdominal small bowel loops are smaller than previously, now maximal diameter 3.2 cm. No evidence for free air. No air-fluid level identified. No new abnormal radiopacity.  IMPRESSION: Decreased left mid abdominal small bowel distention which may indicate improving small bowel obstruction or focal ileus.   Electronically Signed   By: Christiana Pellant M.D.   On: 03/23/2014 10:14   Dg Abd Acute W/chest  03/22/2014   CLINICAL DATA:  Nausea, vomiting.  EXAM: ACUTE ABDOMEN SERIES (ABDOMEN 2 VIEW & CHEST 1 VIEW)  COMPARISON:  August 04, 2013.  FINDINGS: No colon dilatation is noted. Minimally dilated small bowel loops are noted which may represent ileus or less likely small bowel obstruction. No radiopaque calculi or other significant radiographic abnormality is seen. Heart size and mediastinal contours are within normal limits. Both lungs are clear.  IMPRESSION: Minimally dilated small bowel loops are noted which may represent ileus or less likely distal small bowel obstruction. Follow-up radiographs are recommended.   Electronically Signed   By: Roque Lias M.D.   On: 03/22/2014 09:09    Microbiology: Recent Results (from the past 240 hour(s))  MRSA PCR SCREENING     Status: Abnormal   Collection Time    03/21/14 11:51 PM      Result Value Ref Range Status   MRSA by PCR POSITIVE (*) NEGATIVE Final   Comment:            The GeneXpert MRSA Assay (FDA     approved for NASAL specimens     only), is one component of a     comprehensive MRSA colonization     surveillance program. It is not     intended to diagnose MRSA     infection nor to guide or     monitor treatment for     MRSA infections.     RESULT  CALLED TO, READ BACK BY AND VERIFIED WITH:     C.DENNY,RN AT 0221 ON 03/22/14 BY [facility]     Labs: Basic Metabolic Panel:  Recent Labs Lab 03/21/14 2147 03/21/14 2229 03/22/14 0152 03/22/14 0352 03/22/14 0612 03/23/14 0439  NA 134* 131* 136* 137 137 134*  K 4.9 5.4* 4.2  3.9 3.9 4.3  CL 92* 99 101 103 102 102  CO2 16*  --  22 21 22 24   GLUCOSE 483* 461* 310* 197* 133* 285*  BUN 38* 55* 36* 33* 33* 20  CREATININE 1.56* 1.80* 1.49* 1.32* 1.16* 1.29*  CALCIUM 10.6*  --  9.4 9.2 9.6 10.1  MG  --   --   --   --   --  1.7   Liver Function Tests:  Recent Labs Lab 03/21/14 2147  AST 14  ALT 13  ALKPHOS 120*  BILITOT 0.8  PROT 7.5  ALBUMIN 3.3*   No results found for this basename: LIPASE, AMYLASE,  in the last 168 hours No results found for this basename: AMMONIA,  in the last 168 hours CBC:  Recent Labs Lab 03/21/14 2147 03/21/14 2229 03/22/14 0913 03/23/14 0439  WBC 7.5  --  5.3 4.5  NEUTROABS 5.5  --  3.4  --   HGB 15.4* 17.3* 13.8 13.5  HCT 47.0* 51.0* 41.9 42.3  MCV 91.1  --  90.3 91.8  PLT 175  --  140* 120*   Cardiac Enzymes:  Recent Labs Lab 03/22/14 0913 03/22/14 1329 03/22/14 1905  TROPONINI <0.30 <0.30 <0.30   BNP: BNP (last 3 results) No results found for this basename: PROBNP,  in the last 8760 hours CBG:  Recent Labs Lab 03/22/14 1201 03/22/14 1647 03/22/14 2234 03/23/14 0720 03/23/14 1154  GLUCAP 164* 318* 238* 295* 165*       Signed:  THOMPSON,DANIEL MD [facility] 03/23/2014, 3:11 PM

## 2014-03-27 ENCOUNTER — Inpatient Hospital Stay: Payer: Medicare Other | Admitting: Internal Medicine

## 2014-04-19 ENCOUNTER — Encounter (HOSPITAL_COMMUNITY): Payer: Self-pay | Admitting: Emergency Medicine

## 2014-04-19 ENCOUNTER — Emergency Department (HOSPITAL_COMMUNITY): Payer: Medicare Other

## 2014-04-19 ENCOUNTER — Inpatient Hospital Stay (HOSPITAL_COMMUNITY)
Admission: EM | Admit: 2014-04-19 | Discharge: 2014-04-21 | DRG: 603 | Disposition: A | Discharge status: Home / Self Care | Payer: Medicare Other | Attending: Internal Medicine | Admitting: Internal Medicine

## 2014-04-19 DIAGNOSIS — L03119 Cellulitis of unspecified part of limb: Secondary | ICD-10-CM | POA: Diagnosis not present

## 2014-04-19 DIAGNOSIS — Z87891 Personal history of nicotine dependence: Secondary | ICD-10-CM

## 2014-04-19 DIAGNOSIS — Z940 Kidney transplant status: Secondary | ICD-10-CM

## 2014-04-19 DIAGNOSIS — E109 Type 1 diabetes mellitus without complications: Secondary | ICD-10-CM | POA: Diagnosis present

## 2014-04-19 DIAGNOSIS — E119 Type 2 diabetes mellitus without complications: Secondary | ICD-10-CM | POA: Diagnosis present

## 2014-04-19 DIAGNOSIS — E871 Hypo-osmolality and hyponatremia: Secondary | ICD-10-CM | POA: Diagnosis present

## 2014-04-19 DIAGNOSIS — L02619 Cutaneous abscess of unspecified foot: Secondary | ICD-10-CM | POA: Diagnosis not present

## 2014-04-19 DIAGNOSIS — F3289 Other specified depressive episodes: Secondary | ICD-10-CM | POA: Diagnosis present

## 2014-04-19 DIAGNOSIS — S98139A Complete traumatic amputation of one unspecified lesser toe, initial encounter: Secondary | ICD-10-CM

## 2014-04-19 DIAGNOSIS — IMO0002 Reserved for concepts with insufficient information to code with codable children: Secondary | ICD-10-CM

## 2014-04-19 DIAGNOSIS — L03116 Cellulitis of left lower limb: Secondary | ICD-10-CM

## 2014-04-19 DIAGNOSIS — E039 Hypothyroidism, unspecified: Secondary | ICD-10-CM | POA: Diagnosis present

## 2014-04-19 DIAGNOSIS — I10 Essential (primary) hypertension: Secondary | ICD-10-CM | POA: Diagnosis present

## 2014-04-19 DIAGNOSIS — E038 Other specified hypothyroidism: Secondary | ICD-10-CM

## 2014-04-19 DIAGNOSIS — F329 Major depressive disorder, single episode, unspecified: Secondary | ICD-10-CM | POA: Diagnosis present

## 2014-04-19 DIAGNOSIS — Z794 Long term (current) use of insulin: Secondary | ICD-10-CM

## 2014-04-19 LAB — CBG MONITORING, ED: Glucose-Capillary: 211 mg/dL — ABNORMAL HIGH (ref 70–99)

## 2014-04-19 MED ORDER — SODIUM CHLORIDE 0.9 % IV BOLUS (SEPSIS)
1000.0000 mL | Freq: Once | INTRAVENOUS | Status: AC
  Administered 2014-04-20: 1000 mL via INTRAVENOUS

## 2014-04-19 MED ORDER — MORPHINE SULFATE 4 MG/ML IJ SOLN
4.0000 mg | Freq: Once | INTRAMUSCULAR | Status: AC
  Administered 2014-04-20: 4 mg via INTRAVENOUS
  Filled 2014-04-19: qty 1

## 2014-04-19 NOTE — ED Notes (Signed)
Pt has pain and blanching to L great toe. Pt states she noticed a blister to L big toe about 1 1/2 weeks ago. Pt has redness to top of toe and base of toe. Pt is diabetic and states she has already lost 3 toes on her R foot. Pt able to ambulate.

## 2014-04-19 NOTE — ED Provider Notes (Signed)
CSN: [id number]     Arrival date & time 04/19/14  2120 History   First MD Initiated Contact with Patient 04/19/14 2331     Chief Complaint  Patient presents with  . Cellulitis  . Toe Pain     (Consider location/radiation/quality/duration/timing/severity/associated sxs/prior Treatment) HPI Pt is a 48yo female with hx of HTN, immunosuppression secondary to renal transplant, and IDDM presenting to ED with c/o left great toe pain that started 1.5 weeks ago with a blister on the bottom of her left great toe.  Pt states pain has gradually worsened and she has noticed an ulceration forming.  Pain is constant, aching, 8/10, worse with ambulation and palpation.  Reports associated erythema to left foot. Denies discharge. Does report requiring toes on right foot needing amputation due to diabetes complications.  Denies fever, n/v/d. Denies taking medication for pain.   Past Medical History  Diagnosis Date  . Hypertension   . Hypothyroid   . Depression   . 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 Kidney disease   . End stage renal disease     right arm AV graft, post transplant  . Diabetes mellitus     Insulin dependant   Past Surgical History  Procedure Laterality Date  . Back surgery  2001  . Tubal ligation    . Dg av dialysis graft declot or    . Nephrectomy transplanted organ    . Kidney transplant  December 16, 2009  . 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  . Cataract extraction w/ intraocular lens implant Right    Family History  Problem Relation Age of Onset  . Emphysema Mother   . Throat cancer Mother   . COPD Mother   . Cancer Mother   . Emphysema Father   . COPD Father   . Stroke Father   . ADD / ADHD Son    History  Substance Use Topics  . Smoking status: Former Smoker -- 1.00 packs/day for 11 years    Types: Cigarettes    Quit date: 09/21/1998  . Smokeless tobacco: Never Used  . Alcohol Use: No   OB History   Grav Para Term Preterm Abortions TAB SAB  Ect Mult Living                 Review of Systems  Constitutional: Negative for fever and chills.  Gastrointestinal: Negative for nausea, vomiting, abdominal pain and diarrhea.  Musculoskeletal: Positive for arthralgias and myalgias.       Left foot  Skin: Positive for color change ( erythema in left foot) and wound ( bottom of left great toe).  Neurological: Negative for weakness and numbness.  All other systems reviewed and are negative.     Allergies  Ace inhibitors and Adhesive  Home Medications   Prior to Admission medications   Medication Sig Start Date End Date Taking? Authorizing Provider  amLODipine (NORVASC) 5 MG tablet Take 1 tablet (5 mg total) by mouth daily. 08/08/12  Yes Alison Murray, MD  carvedilol (COREG) 25 MG tablet Take 2 tablets (50 mg total) by mouth 2 (two) times daily. 08/08/12  Yes Alison Murray, MD  insulin aspart protamine- aspart (NOVOLOG MIX 70/30) (70-30) 100 UNIT/ML injection Inject 0.25 mLs (25 Units total) into the skin 2 (two) times daily with a meal. 03/23/14  Yes Rodolph Bong, MD  levothyroxine (SYNTHROID, LEVOTHROID) 137 MCG tablet Take 137 mcg by mouth daily. Name brand   Yes  Historical Provider, MD  Multiple Vitamin (MULTIVITAMIN WITH MINERALS) TABS tablet Take 1 tablet by mouth daily.   Yes Historical Provider, MD  mycophenolate (CELLCEPT) 250 MG capsule Take 500 mg by mouth 2 (two) times daily.    Yes Historical Provider, MD  predniSONE (DELTASONE) 5 MG tablet Take 5 mg by mouth daily.     Yes Historical Provider, MD  ranitidine (ZANTAC) 150 MG capsule Take 150 mg by mouth daily.    Yes Historical Provider, MD  tacrolimus (PROGRAF) 1 MG capsule Take 1 mg by mouth 2 (two) times daily.    Yes Historical Provider, MD   BP 139/65  Pulse 82  Temp(Src) 98 F (36.7 C) (Oral)  Resp 18  SpO2 97%  LMP 11/19/2011 Physical Exam  Nursing note and vitals reviewed. Constitutional: She appears well-developed and well-nourished. No distress.   HENT:  Head: Normocephalic and atraumatic.  Eyes: Conjunctivae are normal. No scleral icterus.  Neck: Normal range of motion.  Cardiovascular: Normal rate, regular rhythm and normal heart sounds.   Pulses:      Dorsalis pedis pulses are 2+ on the left side.  Pulmonary/Chest: Effort normal and breath sounds normal. No respiratory distress. She has no wheezes. She has no rales. She exhibits no tenderness.  Abdominal: Soft. Bowel sounds are normal. She exhibits no distension and no mass. There is no tenderness. There is no rebound and no guarding.  Musculoskeletal: Normal range of motion.  Left foot: left great toe-thick long ingrown toenail. Erythema to great toe with brown scabbed ulceration on plantar aspect of great toe.  Erythema, warmth and tenderness to dorsal aspect left foot.  1+ pitting edema to mid-lower leg.  FROM left ankle and toes  Neurological: She is alert.  Sensation to light touch in tact left foot  Skin: Skin is warm and dry. She is not diaphoretic. There is erythema.    ED Course  Procedures (including critical care time) Labs Review Labs Reviewed  BASIC METABOLIC PANEL - Abnormal; Notable for the following:    Sodium 136 (*)    Glucose, Bld 169 (*)    Creatinine, Ser 1.14 (*)    GFR calc non Af Amer 56 (*)    GFR calc Af Amer 65 (*)    All other components within normal limits  CBG MONITORING, ED - Abnormal; Notable for the following:    Glucose-Capillary 211 (*)    All other components within normal limits  CBC    Imaging Review Dg Foot Complete Left  04/20/2014   CLINICAL DATA:  CELLULITIS TOE PAIN  EXAM: LEFT FOOT - COMPLETE 3+ VIEW  COMPARISON:  None.  FINDINGS: No acute fracture or dislocation. Joint spaces are maintained. No osseous erosions to suggest active osteomyelitis. Diffuse osteopenia present. Posterior and plantar calcaneal enthesophyte is noted. Degenerative spurring present at the dorsal talonavicular joint.  No soft tissue emphysema or other  abnormality identified. Prominent vascular calcifications present.  IMPRESSION: No acute abnormality about the left foot.   Electronically Signed   By: Rise Mu M.D.   On: 04/20/2014 01:28   Dg Toe Great Left  04/20/2014   CLINICAL DATA:  CELLULITIS TOE PAIN  EXAM: LEFT GREAT TOE  COMPARISON:  None.  FINDINGS: No acute fracture or dislocation. No osseous erosions to suggest active osteomyelitis identified. Joint spaces are maintained. No soft tissue abnormality. No radiopaque foreign body. Prominent vascular calcifications noted.  IMPRESSION: 1. No radiographic evidence of active infection or osteomyelitis identified. 2. No acute fracture  dislocation.   Electronically Signed   By: Rise MuBenjamin  McClintock M.D.   On: 04/20/2014 01:19     EKG Interpretation None      MDM   Final diagnoses:  Cellulitis of left foot    Pt is a 48yo female with hx of IDDM as well as immunosuppressed due to renal transplant presenting to ED with signs and symptoms consistent with cellulitis of left great toe and left foot.  Will get plain films to ensure no osteomyelitis. Pt is non-toxic appearing, afebrile. CBC and BMP: unremarkable. Discussed pt with Dr. Mike CrazeMulpos who also examined pt, will admit for tx of cellulitis which is complicated by pt's immunosuppression from renal transplant medication.   Plain films: no evidence of active infection or osteomyelitis. Pt given 1g IV vancomycin in ED.  1:36 AM Pt accepted by Dr. Sunnie Nielsenegalado for admission to med-surg bed. Pt is stable at this time.      Junius FinnerErin O'Malley, PA-C 04/20/14 [phone]

## 2014-04-20 ENCOUNTER — Encounter (HOSPITAL_COMMUNITY): Payer: Self-pay | Admitting: *Deleted

## 2014-04-20 DIAGNOSIS — L02619 Cutaneous abscess of unspecified foot: Principal | ICD-10-CM

## 2014-04-20 DIAGNOSIS — I739 Peripheral vascular disease, unspecified: Secondary | ICD-10-CM

## 2014-04-20 DIAGNOSIS — Z940 Kidney transplant status: Secondary | ICD-10-CM | POA: Diagnosis not present

## 2014-04-20 DIAGNOSIS — L98499 Non-pressure chronic ulcer of skin of other sites with unspecified severity: Secondary | ICD-10-CM

## 2014-04-20 DIAGNOSIS — E871 Hypo-osmolality and hyponatremia: Secondary | ICD-10-CM | POA: Diagnosis present

## 2014-04-20 DIAGNOSIS — Z794 Long term (current) use of insulin: Secondary | ICD-10-CM | POA: Diagnosis not present

## 2014-04-20 DIAGNOSIS — S98139A Complete traumatic amputation of one unspecified lesser toe, initial encounter: Secondary | ICD-10-CM | POA: Diagnosis not present

## 2014-04-20 DIAGNOSIS — Z87891 Personal history of nicotine dependence: Secondary | ICD-10-CM | POA: Diagnosis not present

## 2014-04-20 DIAGNOSIS — E109 Type 1 diabetes mellitus without complications: Secondary | ICD-10-CM | POA: Diagnosis present

## 2014-04-20 DIAGNOSIS — L03119 Cellulitis of unspecified part of limb: Principal | ICD-10-CM

## 2014-04-20 DIAGNOSIS — IMO0002 Reserved for concepts with insufficient information to code with codable children: Secondary | ICD-10-CM | POA: Diagnosis not present

## 2014-04-20 DIAGNOSIS — E039 Hypothyroidism, unspecified: Secondary | ICD-10-CM | POA: Diagnosis present

## 2014-04-20 DIAGNOSIS — I10 Essential (primary) hypertension: Secondary | ICD-10-CM | POA: Diagnosis present

## 2014-04-20 DIAGNOSIS — F3289 Other specified depressive episodes: Secondary | ICD-10-CM | POA: Diagnosis present

## 2014-04-20 DIAGNOSIS — F329 Major depressive disorder, single episode, unspecified: Secondary | ICD-10-CM | POA: Diagnosis present

## 2014-04-20 DIAGNOSIS — E038 Other specified hypothyroidism: Secondary | ICD-10-CM

## 2014-04-20 DIAGNOSIS — L03116 Cellulitis of left lower limb: Secondary | ICD-10-CM | POA: Diagnosis present

## 2014-04-20 DIAGNOSIS — L97509 Non-pressure chronic ulcer of other part of unspecified foot with unspecified severity: Secondary | ICD-10-CM

## 2014-04-20 LAB — BASIC METABOLIC PANEL
ANION GAP: 13 (ref 5–15)
Anion gap: 14 (ref 5–15)
BUN: 22 mg/dL (ref 6–23)
BUN: 22 mg/dL (ref 6–23)
CALCIUM: 9.7 mg/dL (ref 8.4–10.5)
CO2: 24 mEq/L (ref 19–32)
CO2: 24 mEq/L (ref 19–32)
CREATININE: 1.12 mg/dL — AB (ref 0.50–1.10)
Calcium: 10.3 mg/dL (ref 8.4–10.5)
Chloride: 98 mEq/L (ref 96–112)
Chloride: 99 mEq/L (ref 96–112)
Creatinine, Ser: 1.14 mg/dL — ABNORMAL HIGH (ref 0.50–1.10)
GFR calc Af Amer: 65 mL/min — ABNORMAL LOW (ref >90)
GFR calc Af Amer: 67 mL/min — ABNORMAL LOW (ref >90)
GFR calc non Af Amer: 56 mL/min — ABNORMAL LOW (ref >90)
GFR calc non Af Amer: 58 mL/min — ABNORMAL LOW (ref >90)
Glucose, Bld: 169 mg/dL — ABNORMAL HIGH (ref 70–99)
Glucose, Bld: 100 mg/dL — ABNORMAL HIGH (ref 70–99)
Potassium: 4.2 mEq/L (ref 3.7–5.3)
Potassium: 4.3 mEq/L (ref 3.7–5.3)
Sodium: 136 mEq/L — ABNORMAL LOW (ref 137–147)
Sodium: 136 mEq/L — ABNORMAL LOW (ref 137–147)

## 2014-04-20 LAB — CBC
HCT: 41.3 % (ref 36.0–46.0)
HEMATOCRIT: 38.9 % (ref 36.0–46.0)
Hemoglobin: 12.8 g/dL (ref 12.0–15.0)
Hemoglobin: 13.8 g/dL (ref 12.0–15.0)
MCH: 29.8 pg (ref 26.0–34.0)
MCH: 30.2 pg (ref 26.0–34.0)
MCHC: 32.9 g/dL (ref 30.0–36.0)
MCHC: 33.4 g/dL (ref 30.0–36.0)
MCV: 90.4 fL (ref 78.0–100.0)
MCV: 90.7 fL (ref 78.0–100.0)
Platelets: 159 10*3/uL (ref 150–400)
Platelets: ADEQUATE 10*3/uL (ref 150–400)
RBC: 4.29 MIL/uL (ref 3.87–5.11)
RBC: 4.57 MIL/uL (ref 3.87–5.11)
RDW: 13.1 % (ref 11.5–15.5)
RDW: 13.3 % (ref 11.5–15.5)
WBC: 5 10*3/uL (ref 4.0–10.5)
WBC: 6 10*3/uL (ref 4.0–10.5)

## 2014-04-20 LAB — GLUCOSE, CAPILLARY
Glucose-Capillary: 135 mg/dL — ABNORMAL HIGH (ref 70–99)
Glucose-Capillary: 112 mg/dL — ABNORMAL HIGH (ref 70–99)
Glucose-Capillary: 240 mg/dL — ABNORMAL HIGH (ref 70–99)

## 2014-04-20 LAB — MRSA PCR SCREENING: MRSA by PCR: NEGATIVE

## 2014-04-20 LAB — MAGNESIUM: Magnesium: 1.6 mg/dL (ref 1.5–2.5)

## 2014-04-20 MED ORDER — PIPERACILLIN-TAZOBACTAM 3.375 G IVPB
3.3750 g | Freq: Three times a day (TID) | INTRAVENOUS | Status: DC
  Administered 2014-04-20 – 2014-04-21 (×4): 3.375 g via INTRAVENOUS
  Filled 2014-04-20 (×5): qty 50

## 2014-04-20 MED ORDER — ADULT MULTIVITAMIN W/MINERALS CH
1.0000 | ORAL_TABLET | Freq: Every day | ORAL | Status: DC
  Administered 2014-04-20 – 2014-04-21 (×2): 1 via ORAL
  Filled 2014-04-20 (×2): qty 1

## 2014-04-20 MED ORDER — MYCOPHENOLATE MOFETIL 250 MG PO CAPS
500.0000 mg | ORAL_CAPSULE | Freq: Two times a day (BID) | ORAL | Status: DC
  Administered 2014-04-20 – 2014-04-21 (×3): 500 mg via ORAL
  Filled 2014-04-20 (×5): qty 2

## 2014-04-20 MED ORDER — VANCOMYCIN HCL IN DEXTROSE 1-5 GM/200ML-% IV SOLN
1000.0000 mg | Freq: Once | INTRAVENOUS | Status: AC
  Administered 2014-04-20: 1000 mg via INTRAVENOUS
  Filled 2014-04-20: qty 200

## 2014-04-20 MED ORDER — SODIUM CHLORIDE 0.9 % IV SOLN
INTRAVENOUS | Status: DC
  Administered 2014-04-20: 04:00:00 via INTRAVENOUS

## 2014-04-20 MED ORDER — ONDANSETRON HCL 4 MG PO TABS: 4.0000 mg | ORAL_TABLET | Freq: Four times a day (QID) | ORAL | Status: DC | PRN

## 2014-04-20 MED ORDER — ACETAMINOPHEN 325 MG PO TABS: 650.0000 mg | ORAL_TABLET | Freq: Four times a day (QID) | ORAL | Status: DC | PRN

## 2014-04-20 MED ORDER — PIPERACILLIN-TAZOBACTAM 3.375 G IVPB
3.3750 g | Freq: Once | INTRAVENOUS | Status: AC
  Administered 2014-04-20: 3.375 g via INTRAVENOUS
  Filled 2014-04-20: qty 50

## 2014-04-20 MED ORDER — ACETAMINOPHEN 650 MG RE SUPP: 650.0000 mg | Freq: Four times a day (QID) | RECTAL | Status: DC | PRN

## 2014-04-20 MED ORDER — INSULIN ASPART PROT & ASPART (70-30 MIX) 100 UNIT/ML ~~LOC~~ SUSP
25.0000 [IU] | Freq: Two times a day (BID) | SUBCUTANEOUS | Status: DC
  Administered 2014-04-20 – 2014-04-21 (×3): 25 [IU] via SUBCUTANEOUS
  Filled 2014-04-20: qty 10

## 2014-04-20 MED ORDER — TACROLIMUS 1 MG PO CAPS
1.0000 mg | ORAL_CAPSULE | Freq: Two times a day (BID) | ORAL | Status: DC
  Administered 2014-04-20 – 2014-04-21 (×3): 1 mg via ORAL
  Filled 2014-04-20 (×5): qty 1

## 2014-04-20 MED ORDER — ONDANSETRON HCL 4 MG/2ML IJ SOLN
4.0000 mg | Freq: Four times a day (QID) | INTRAMUSCULAR | Status: DC | PRN
  Administered 2014-04-21: 4 mg via INTRAVENOUS
  Filled 2014-04-20: qty 2

## 2014-04-20 MED ORDER — LORATADINE 10 MG PO TABS
10.0000 mg | ORAL_TABLET | Freq: Every day | ORAL | Status: DC
  Administered 2014-04-20 – 2014-04-21 (×2): 10 mg via ORAL
  Filled 2014-04-20 (×2): qty 1

## 2014-04-20 MED ORDER — MORPHINE SULFATE 2 MG/ML IJ SOLN
1.0000 mg | INTRAMUSCULAR | Status: DC | PRN
  Administered 2014-04-20 – 2014-04-21 (×2): 1 mg via INTRAVENOUS
  Filled 2014-04-20 (×2): qty 1

## 2014-04-20 MED ORDER — PREDNISONE 5 MG PO TABS
5.0000 mg | ORAL_TABLET | Freq: Every day | ORAL | Status: DC
  Administered 2014-04-20 – 2014-04-21 (×2): 5 mg via ORAL
  Filled 2014-04-20 (×3): qty 1

## 2014-04-20 MED ORDER — CARVEDILOL 25 MG PO TABS
50.0000 mg | ORAL_TABLET | Freq: Two times a day (BID) | ORAL | Status: DC
  Administered 2014-04-20 – 2014-04-21 (×3): 50 mg via ORAL
  Filled 2014-04-20 (×4): qty 2

## 2014-04-20 MED ORDER — FAMOTIDINE 20 MG PO TABS
20.0000 mg | ORAL_TABLET | Freq: Every day | ORAL | Status: DC
  Administered 2014-04-20 – 2014-04-21 (×2): 20 mg via ORAL
  Filled 2014-04-20 (×2): qty 1

## 2014-04-20 MED ORDER — BENZONATATE 100 MG PO CAPS
100.0000 mg | ORAL_CAPSULE | Freq: Three times a day (TID) | ORAL | Status: DC | PRN
  Administered 2014-04-20: 100 mg via ORAL
  Filled 2014-04-20: qty 1

## 2014-04-20 MED ORDER — DOCUSATE SODIUM 100 MG PO CAPS
100.0000 mg | ORAL_CAPSULE | Freq: Two times a day (BID) | ORAL | Status: DC
  Administered 2014-04-20 (×2): 100 mg via ORAL

## 2014-04-20 MED ORDER — VANCOMYCIN HCL IN DEXTROSE 1-5 GM/200ML-% IV SOLN
1000.0000 mg | Freq: Two times a day (BID) | INTRAVENOUS | Status: DC
  Administered 2014-04-20 – 2014-04-21 (×2): 1000 mg via INTRAVENOUS
  Filled 2014-04-20 (×3): qty 200

## 2014-04-20 MED ORDER — PSEUDOEPHEDRINE HCL ER 120 MG PO TB12
120.0000 mg | ORAL_TABLET | Freq: Two times a day (BID) | ORAL | Status: DC
  Administered 2014-04-20 – 2014-04-21 (×2): 120 mg via ORAL
  Filled 2014-04-20 (×4): qty 1

## 2014-04-20 MED ORDER — ENOXAPARIN SODIUM 40 MG/0.4ML ~~LOC~~ SOLN
40.0000 mg | SUBCUTANEOUS | Status: DC
  Administered 2014-04-20 – 2014-04-21 (×2): 40 mg via SUBCUTANEOUS
  Filled 2014-04-20 (×2): qty 0.4

## 2014-04-20 MED ORDER — LEVOTHYROXINE SODIUM 137 MCG PO TABS
137.0000 ug | ORAL_TABLET | Freq: Every day | ORAL | Status: DC
  Administered 2014-04-20 – 2014-04-21 (×2): 137 ug via ORAL
  Filled 2014-04-20 (×3): qty 1

## 2014-04-20 MED ORDER — HYDROCODONE-ACETAMINOPHEN 5-325 MG PO TABS
1.0000 | ORAL_TABLET | ORAL | Status: DC | PRN
  Administered 2014-04-20 – 2014-04-21 (×5): 2 via ORAL
  Filled 2014-04-20 (×5): qty 2

## 2014-04-20 NOTE — H&P (Signed)
[facility] History and Physical  [name] Parodyamela S Hackenberg OZH:[id number] DOB: 09/06/1966 DOA: 04/19/2014  Referring physician: Dr Read DriversMolpus.  PCP: Irena CordsOLADONATO,JOSEPH A, MD   Chief Complaint: left big toe pain, swelling, redness.   HPI: [name] is a 48 y.o. female with PMH significant for Diabetes type 1, History of renal failure S/P kidney transplant in 2011, on immunosuppressive medications, prior history of toes amputation who presents complaining of left big toe swelling , pain , redness that stated 3 days prior to admission. She has notice that swelling and redness is getting worse and is spreading to her foot. Everything started with small blister planta area big toe. She has been applying peroxide and antibiotics ointment.   She denies fevers, chill, nausea or vomiting. She only notice mild cough.    Review of Systems:  Negative, except as per HPI.   Past Medical History  Diagnosis Date  . Hypertension   . Hypothyroid   . Depression   . 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 Kidney disease   . End stage renal disease     right arm AV graft, post transplant  . Diabetes mellitus     Insulin dependant   Past Surgical History  Procedure Laterality Date  . Back surgery  2001  . Tubal ligation    . Dg av dialysis graft declot or    . Nephrectomy transplanted organ    . Kidney transplant  December 16, 2009  . 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  . Cataract extraction w/ intraocular lens implant Right    Social History:  reports that she quit smoking about 15 years ago. Her smoking use included Cigarettes. She has a 11 pack-year smoking history. She has never used smokeless tobacco. She reports that she does not drink alcohol or use illicit drugs.  Allergies  Allergen Reactions  . Ace Inhibitors Other (See Comments)    Cough   ( Lisinopril )  . Adhesive [Tape] Itching    Family History  Problem Relation Age of Onset  . Emphysema Mother   . Throat  cancer Mother   . COPD Mother   . Cancer Mother   . Emphysema Father   . COPD Father   . Stroke Father   . ADD / ADHD Son      Prior to Admission medications   Medication Sig Start Date End Date Taking? Authorizing Provider  amLODipine (NORVASC) 5 MG tablet Take 1 tablet (5 mg total) by mouth daily. 08/08/12  Yes Alison MurrayAlma M Devine, MD  carvedilol (COREG) 25 MG tablet Take 2 tablets (50 mg total) by mouth 2 (two) times daily. 08/08/12  Yes Alison MurrayAlma M Devine, MD  insulin aspart protamine- aspart (NOVOLOG MIX 70/30) (70-30) 100 UNIT/ML injection Inject 0.25 mLs (25 Units total) into the skin 2 (two) times daily with a meal. 03/23/14  Yes Rodolph Bonganiel Thompson V, MD  levothyroxine (SYNTHROID, LEVOTHROID) 137 MCG tablet Take 137 mcg by mouth daily. Name brand   Yes Historical Provider, MD  Multiple Vitamin (MULTIVITAMIN WITH MINERALS) TABS tablet Take 1 tablet by mouth daily.   Yes Historical Provider, MD  mycophenolate (CELLCEPT) 250 MG capsule Take 500 mg by mouth 2 (two) times daily.    Yes Historical Provider, MD  predniSONE (DELTASONE) 5 MG tablet Take 5 mg by mouth daily.     Yes Historical Provider, MD  ranitidine (ZANTAC) 150 MG capsule Take 150 mg by mouth daily.    Yes Historical  Provider, MD  tacrolimus (PROGRAF) 1 MG capsule Take 1 mg by mouth 2 (two) times daily.    Yes Historical Provider, MD   Physical Exam: Filed Vitals:   04/19/14 2130 04/20/14 0029  BP: 143/69 139/65  Pulse: 83 82  Temp: 97.9 F (36.6 C) 98 F (36.7 C)  TempSrc: Oral Oral  Resp: 16 18  SpO2: 95% 97%    Wt Readings from Last 3 Encounters:  03/23/14 100.8 kg (222 lb 3.6 oz)  08/05/13 98.431 kg (217 lb)  06/07/13 101.152 kg (223 lb)    General:  Appears calm and comfortable Eyes: PERRL, normal lids, irises & conjunctiva ENT: grossly normal hearing, lips & tongue Neck: no LAD, masses or thyromegaly Cardiovascular: RRR, no m/r/g. No LE edema. Respiratory: CTA bilaterally, no w/r/r. Normal respiratory  effort. Abdomen: soft, ntnd Skin: Left big toe with redness, swelling, ulceration posterior aspect, black discoloration, erythema, left foot anterior aspect with erythema.  Musculoskeletal: grossly normal tone BUE/BLE. Right foot with multiples toe amputation, 1, 2 3 .  Psychiatric: grossly normal mood and affect, speech fluent and appropriate Neurologic: grossly non-focal.          Labs on Admission:  Basic Metabolic Panel:  Recent Labs Lab 04/20/14 0035  NA 136*  K 4.2  CL 98  CO2 24  GLUCOSE 169*  BUN 22  CREATININE 1.14*  CALCIUM 10.3   Liver Function Tests: No results found for this basename: AST, ALT, ALKPHOS, BILITOT, PROT, ALBUMIN,  in the last 168 hours No results found for this basename: LIPASE, AMYLASE,  in the last 168 hours No results found for this basename: AMMONIA,  in the last 168 hours CBC:  Recent Labs Lab 04/20/14 0035  WBC 6.0  HGB 13.8  HCT 41.3  MCV 90.4  PLT 159   Cardiac Enzymes: No results found for this basename: CKTOTAL, CKMB, CKMBINDEX, TROPONINI,  in the last 168 hours  BNP (last 3 results) No results found for this basename: PROBNP,  in the last 8760 hours CBG:  Recent Labs Lab 04/19/14 2348  GLUCAP 211*    Radiological Exams on Admission: Dg Foot Complete Left  04/20/2014   CLINICAL DATA:  CELLULITIS TOE PAIN  EXAM: LEFT FOOT - COMPLETE 3+ VIEW  COMPARISON:  None.  FINDINGS: No acute fracture or dislocation. Joint spaces are maintained. No osseous erosions to suggest active osteomyelitis. Diffuse osteopenia present. Posterior and plantar calcaneal enthesophyte is noted. Degenerative spurring present at the dorsal talonavicular joint.  No soft tissue emphysema or other abnormality identified. Prominent vascular calcifications present.  IMPRESSION: No acute abnormality about the left foot.   Electronically Signed   By: Rise Mu M.D.   On: 04/20/2014 01:28   Dg Toe Great Left  04/20/2014   CLINICAL DATA:  CELLULITIS TOE  PAIN  EXAM: LEFT GREAT TOE  COMPARISON:  None.  FINDINGS: No acute fracture or dislocation. No osseous erosions to suggest active osteomyelitis identified. Joint spaces are maintained. No soft tissue abnormality. No radiopaque foreign body. Prominent vascular calcifications noted.  IMPRESSION: 1. No radiographic evidence of active infection or osteomyelitis identified. 2. No acute fracture dislocation.   Electronically Signed   By: Rise Mu M.D.   On: 04/20/2014 01:19    EKG: none available.   Assessment/Plan Principal Problem:   Cellulitis of left foot Active Problems:   HYPERTENSION   Hyponatremia   H/O kidney transplant   Diabetes mellitus  Left Foot cellulitis, big toe cellulitis: Patient with swelling, redness.  Patient on immune suppressive medication. X ray no evidence of osteomyelitis.  -Admit to hospital for IV antibiotics, IV vancomycin and Zosyn pharmacy to dose.  -Will also check ABI.   2-Diabetes type 1: Continue with 70-/30.   3-History of renal failure S/P renal Transplant 2011: Continue with Prograf, Cellcept, prednisone. Monitor renal function daily.   4-Hypothyroidism: Continue with synthroid.   5-HTN; continue with Coreg. Patient was not sure if she was taking Norvasc.   6-Mild cough: on IV antibiotics.   Code Status: Full Code.  DVT Prophylaxis: Lovenox.  Family Communication: Care discussed with patient.  Disposition Plan: expect 3 to 4 days inpatient.   Time spent: 75 minutes.   Hartley Barefoot A [facility] Pager [phone]  **Disclaimer: This note may have been dictated with voice recognition software. Similar sounding words can inadvertently be transcribed and this note may contain transcription errors which may not have been corrected upon publication of note.**

## 2014-04-20 NOTE — Progress Notes (Signed)
CARE MANAGEMENT NOTE 04/20/2014  Patient:  Exie ParodyCARROLL,Kayly S   Account Number:  [id number]  Date Initiated:  04/20/2014  Documentation initiated by:  Jenniger Figiel  Subjective/Objective Assessment:   48 y.o. female with PMH significant for Diabetes type 1, History of renal failure S/P kidney transplant in 2011, on immunosuppressive medications, prior history of toes amputation who presents complaining of left big toe swelling , pain , r     Action/Plan:   home when stable   Anticipated DC Date:  04/23/2014   Anticipated DC Plan:  HOME/SELF CARE  In-house referral  NA      DC Planning Services  CM consult      PAC Choice  NA   Choice offered to / List presented to:  NA   DME arranged  NA      DME agency  NA     HH arranged  NA      HH agency  NA   Status of service:  In process, will continue to follow Medicare Important Message given?  NA - LOS <3 / Initial given by admissions (If response is "NO", the following Medicare IM given date fields will be blank) Date Medicare IM given:   Medicare IM given by:   Date Additional Medicare IM given:   Additional Medicare IM given by:    Discharge Disposition:    Per UR Regulation:  Reviewed for med. necessity/level of care/duration of stay  If discussed at Long Length of Stay Meetings, dates discussed:    Comments:  Javar Eshbach,RN,BSN,CCM list of providers taking medicare patient given to patient. also instructed to call the number on the back of her medicare care to either update pcp or to have one chosen for her.

## 2014-04-20 NOTE — Progress Notes (Signed)
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 There appears to be no change on the right since study of 2012. The left side however there is a change in the Dorsalis Pedis Doppler waveform to triphasic from monophasic even though the pressure remains calcified. Left TBI appears within normal limits. Right toe pressure could not be obtained due to amputation.  [name], RVS 04/20/2014, 10:31 AM

## 2014-04-20 NOTE — Progress Notes (Signed)
I have seen and assessed patient and agree with Dr. Sumner Boastegalado's assessment and plan.

## 2014-04-20 NOTE — Progress Notes (Signed)
ANTIBIOTIC CONSULT NOTE - INITIAL  Pharmacy Consult for Zosyn and Vancomycin Indication: Cellulitis of L big toe  Allergies  Allergen Reactions  . Ace Inhibitors Other (See Comments)    Cough   ( Lisinopril )  . Adhesive [Tape] Itching    Patient Measurements: Height: 5\' 8"  (172.7 cm) Weight: 223 lb 11.2 oz (101.47 kg) IBW/kg (Calculated) : 63.9   Vital Signs: Temp: 97.8 F (36.6 C) (08/14 0300) Temp src: Oral (08/14 0300) BP: 133/72 mmHg (08/14 0300) Pulse Rate: 75 (08/14 0300) Intake/Output from previous day:   Intake/Output from this shift:    Labs:  Recent Labs  04/20/14 0035  WBC 6.0  HGB 13.8  PLT 159  CREATININE 1.14*   Estimated Creatinine Clearance: 76 ml/min (by C-G formula based on Cr of 1.14). No results found for this basename: VANCOTROUGH, Leodis BinetVANCOPEAK, VANCORANDOM, GENTTROUGH, GENTPEAK, GENTRANDOM, TOBRATROUGH, TOBRAPEAK, TOBRARND, AMIKACINPEAK, AMIKACINTROU, AMIKACIN,  in the last 72 hours   Microbiology: Recent Results (from the past 720 hour(s))  MRSA PCR SCREENING     Status: Abnormal   Collection Time    03/21/14 11:51 PM      Result Value Ref Range Status   MRSA by PCR POSITIVE (*) NEGATIVE Final   Comment:            The GeneXpert MRSA Assay (FDA     approved for NASAL specimens     only), is one component of a     comprehensive MRSA colonization     surveillance program. It is not     intended to diagnose MRSA     infection nor to guide or     monitor treatment for     MRSA infections.     RESULT CALLED TO, READ BACK BY AND VERIFIED WITH:     C.DENNY,RN AT 0221 ON 03/22/14 BY [facility]    Medical History: Past Medical History  Diagnosis Date  . Hypertension   . Hypothyroid   . Depression   . 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 Kidney disease   . End stage renal disease     right arm AV graft, post transplant  . Diabetes mellitus     Insulin dependant    Medications:  Scheduled:  . carvedilol  50 mg Oral  BID  . docusate sodium  100 mg Oral BID  . enoxaparin (LOVENOX) injection  40 mg Subcutaneous Q24H  . famotidine  20 mg Oral Daily  . insulin aspart protamine- aspart  25 Units Subcutaneous BID WC  . levothyroxine  137 mcg Oral Daily  . multivitamin with minerals  1 tablet Oral Daily  . mycophenolate  500 mg Oral BID  . predniSONE  5 mg Oral Daily  . tacrolimus  1 mg Oral BID  . vancomycin  1,000 mg Intravenous Once   Infusions:  . sodium chloride 50 mL/hr at 04/20/14 0343   Assessment: [age] yoF with hx of DM, renal failure s/p kidney transplant 2011 on immunosuppressive meds with prior hx of toe amputation.  Admiited with L big toe swelling, pain and redness.  Zosyn and Vancomycin per Rx for cellulitis.   Goal of Therapy:  Vancomycin trough level 10-15 mcg/ml No evidence of osteo on X-ray  Plan:   Zosyn 3.375 Gm IV q8h EI infusion  Vancomycin 2Gm load (1Gm @ 0149 and 1Gm @ 0444) then 1Gm IV q12h  F/U SCr/levels/cultures as needed   Lorenza EvangelistGreen, Leahanna Buser R 04/20/2014,4:48 AM

## 2014-04-20 NOTE — Progress Notes (Signed)
CARE MANAGEMENT NOTE 04/20/2014  Patient:  Exie ParodyCARROLL,Deondrea S   Account Number:  [id number]  Date Initiated:  04/20/2014  Documentation initiated by:  Michayla Mcneil  Subjective/Objective Assessment:   48 y.o. female with PMH significant for Diabetes type 1, History of renal failure S/P kidney transplant in 2011, on immunosuppressive medications, prior history of toes amputation who presents complaining of left big toe swelling , pain , r     Action/Plan:   home when stable   Anticipated DC Date:  04/23/2014   Anticipated DC Plan:  HOME/SELF CARE  In-house referral  NA      DC Planning Services  CM consult      PAC Choice  NA   Choice offered to / List presented to:  NA   DME arranged  NA      DME agency  NA     HH arranged  NA      HH agency  NA   Status of service:  In process, will continue to follow Medicare Important Message given?  NA - LOS <3 / Initial given by admissions (If response is "NO", the following Medicare IM given date fields will be blank) Date Medicare IM given:   Medicare IM given by:   Date Additional Medicare IM given:   Additional Medicare IM given by:    Discharge Disposition:    Per UR Regulation:  Reviewed for med. necessity/level of care/duration of stay  If discussed at Long Length of Stay Meetings, dates discussed:    Comments:  Bjorn LoserRhonda Desi Rowe,RN,BSN,CCM

## 2014-04-20 NOTE — ED Provider Notes (Signed)
Medical screening examination/treatment/procedure(s) were conducted as a shared visit with non-physician practitioner(s) and myself.  I personally evaluated the patient during the encounter.  1:47 AM Erythema and tenderness of left great toe. There is an ulceration of the tip of the pad of that toe. There is erythema over the dorsal left foot which appears chronic. Because the patient is immunocompromised due to a kidney transplant we will have her admitted for IV antibiotics.    Henryk Ursin L MolpuHanley Seamens, MD 04/20/14 [phone]

## 2014-04-21 DIAGNOSIS — I10 Essential (primary) hypertension: Secondary | ICD-10-CM

## 2014-04-21 LAB — BASIC METABOLIC PANEL
ANION GAP: 10 (ref 5–15)
BUN: 21 mg/dL (ref 6–23)
CO2: 23 mEq/L (ref 19–32)
Calcium: 9.4 mg/dL (ref 8.4–10.5)
Chloride: 103 mEq/L (ref 96–112)
Creatinine, Ser: 1.06 mg/dL (ref 0.50–1.10)
GFR calc Af Amer: 71 mL/min — ABNORMAL LOW (ref >90)
GFR calc non Af Amer: 62 mL/min — ABNORMAL LOW (ref >90)
GLUCOSE: 80 mg/dL (ref 70–99)
POTASSIUM: 4.4 meq/L (ref 3.7–5.3)
Sodium: 136 mEq/L — ABNORMAL LOW (ref 137–147)

## 2014-04-21 LAB — CBC
HCT: 41.4 % (ref 36.0–46.0)
Hemoglobin: 13.1 g/dL (ref 12.0–15.0)
MCH: 29.2 pg (ref 26.0–34.0)
MCHC: 31.6 g/dL (ref 30.0–36.0)
MCV: 92.2 fL (ref 78.0–100.0)
Platelets: 147 10*3/uL — ABNORMAL LOW (ref 150–400)
RBC: 4.49 MIL/uL (ref 3.87–5.11)
RDW: 13.3 % (ref 11.5–15.5)
WBC: 4.2 10*3/uL (ref 4.0–10.5)

## 2014-04-21 LAB — GLUCOSE, CAPILLARY
GLUCOSE-CAPILLARY: 172 mg/dL — AB (ref 70–99)
Glucose-Capillary: 187 mg/dL — ABNORMAL HIGH (ref 70–99)
Glucose-Capillary: 156 mg/dL — ABNORMAL HIGH (ref 70–99)

## 2014-04-21 LAB — MAGNESIUM: MAGNESIUM: 1.6 mg/dL (ref 1.5–2.5)

## 2014-04-21 MED ORDER — DSS 100 MG PO CAPS: 100.0000 mg | ORAL_CAPSULE | Freq: Two times a day (BID) | ORAL | Status: DC

## 2014-04-21 MED ORDER — DOXYCYCLINE HYCLATE 100 MG PO CAPS: 100.0000 mg | ORAL_CAPSULE | Freq: Two times a day (BID) | ORAL | Status: DC

## 2014-04-21 MED ORDER — HYDROCODONE-ACETAMINOPHEN 5-325 MG PO TABS: 1.0000 | ORAL_TABLET | ORAL | Status: DC | PRN

## 2014-04-21 NOTE — Progress Notes (Signed)
CARE MANAGEMENT NOTE 04/21/2014  Patient:  [name],[name]   Account Number:  [id number]  Date Initiated:  04/20/2014  Documentation initiated by:  DAVIS,RHONDA  Subjective/Objective Assessment:   48 y.o. female with PMH significant for Diabetes type 1, History of renal failure [name]/P kidney transplant in 2011, on immunosuppressive medications, prior history of toes amputation who presents complaining of left big toe swelling , pain , r     Action/Plan:   home when stable   Anticipated DC Date:  04/23/2014   Anticipated DC Plan:  HOME/SELF CARE  In-house referral  NA      DC Planning Services  CM consult  PCP issues      PAC Choice  NA   Choice offered to / List presented to:  NA   DME arranged  NA      DME agency  NA     HH arranged  NA      HH agency  NA   Status of service:  Completed, signed off Medicare Important Message given?  YES (If response is "NO", the following Medicare IM given date fields will be blank) Date Medicare IM given:  04/21/2014 Medicare IM given by:  [facility],Antiono Ettinger Date Additional Medicare IM given:   Additional Medicare IM given by:    Discharge Disposition:  HOME/SELF CARE  Per UR Regulation:  Reviewed for med. necessity/level of care/duration of stay  If discussed at Long Length of Stay Meetings, dates discussed:    Comments:  Rhonda Davis,RN,BSN,CCM list of providers taking medicare patient given to patient. also instructed to call the number on the back of her medicare care to either update pcp or to have one chosen for her.

## 2014-04-21 NOTE — Discharge Summary (Signed)
Physician Discharge Summary  [name] NWG:[id number] DOB: 02/05/1966 DOA: 04/19/2014  PCP: Irena CordsOLADONATO,JOSEPH A, MD  Admit date: 04/19/2014 Discharge date: 04/21/2014  Time spent: 65 minutes  Recommendations for Outpatient Follow-up:  1. Follow up with a PCP in 1 week. 2. Follow up with Dr Arrie Aranoladonato as scheduled.  Discharge Diagnoses:  Principal Problem:   Cellulitis of left foot Active Problems:   HYPERTENSION   Hyponatremia   H/O kidney transplant   Diabetes mellitus   Cellulitis of foot, left   Discharge Condition: stable  Diet recommendation: heart healthy diet.  Filed Weights   04/20/14 0300 04/21/14 0736  Weight: 101.47 kg (223 lb 11.2 oz) 101.515 kg (223 lb 12.8 oz)    History of present illness:  [name] is a 48 y.o. female with PMH significant for Diabetes type 1, History of renal failure S/P kidney transplant in 2011, on immunosuppressive medications, prior history of toes amputation who presents complaining of left big toe swelling , pain , redness that stated 3 days prior to admission. She has notice that swelling and redness is getting worse and is spreading to her foot. Everything started with small blister planta area big toe. She has been applying peroxide and antibiotics ointment.  She denies fevers, chill, nausea or vomiting. She only notice mild cough.    Hospital Course:  #1left foot cellulitis/left great toe cellulitis Patient was noted to be on immunosuppressive therapy prior to admission. X-rays which were done of the left foot showed no evidence of osteomyelitis. Patient was maintained on IV vancomycin IV Zosyn. Patient improved clinically. Patient will be transitioned to oral doxycycline to complete a 1 week course of antibiotic  Therapy.patient will followup with PCP as outpatient.  #2 type 1 diabetes Patient was maintained on the home regimen of insulin 70/30 and sliding scale insulin.  #3 history of chronic kidney disease status  post renal transplant 2011 Stable.patient was maintained on a home regimen of Prograf, CellCept, prednisone.  #4 hypertension Continue Coreg.  Prophylaxis Lovenox for DVT prophylaxis.   Procedures:  ABIs 04/20/2014  X-ray of the left foot and left great toe 04/20/2014  Consultations:  None  Discharge Exam: Filed Vitals:   04/21/14 0621  BP: 156/86  Pulse: 75  Temp: 97.8 F (36.6 C)  Resp: 18    General: NAD Cardiovascular: RRR Respiratory: CTAB  Discharge Instructions You were cared for by a hospitalist during your hospital stay. If you have any questions about your discharge medications or the care you received while you were in the hospital after you are discharged, you can call the unit and asked to speak with the hospitalist on call if the hospitalist that took care of you is not available. Once you are discharged, your primary care physician will handle any further medical issues. Please note that NO REFILLS for any discharge medications will be authorized once you are discharged, as it is imperative that you return to your primary care physician (or establish a relationship with a primary care physician if you do not have one) for your aftercare needs so that they can reassess your need for medications and monitor your lab values.  Discharge Instructions   Diet - low sodium heart healthy    Complete by:  As directed      Discharge instructions    Complete by:  As directed   Patient is to pick PCP and follow up with them in 1-2 weeks.     Increase activity slowly  Complete by:  As directed             Medication List         amLODipine 5 MG tablet  Commonly known as:  NORVASC  Take 1 tablet (5 mg total) by mouth daily.     carvedilol 25 MG tablet  Commonly known as:  COREG  Take 2 tablets (50 mg total) by mouth 2 (two) times daily.     doxycycline 100 MG capsule  Commonly known as:  VIBRAMYCIN  Take 1 capsule (100 mg total) by mouth 2 (two) times  daily. Take for 7 days then stop.     DSS 100 MG Caps  Take 100 mg by mouth 2 (two) times daily.     HYDROcodone-acetaminophen 5-325 MG per tablet  Commonly known as:  NORCO/VICODIN  Take 1-2 tablets by mouth every 4 (four) hours as needed for moderate pain.     insulin aspart protamine- aspart (70-30) 100 UNIT/ML injection  Commonly known as:  NOVOLOG MIX 70/30  Inject 0.25 mLs (25 Units total) into the skin 2 (two) times daily with a meal.     levothyroxine 137 MCG tablet  Commonly known as:  SYNTHROID, LEVOTHROID  Take 137 mcg by mouth daily. Name brand     multivitamin with minerals Tabs tablet  Take 1 tablet by mouth daily.     mycophenolate 250 MG capsule  Commonly known as:  CELLCEPT  Take 500 mg by mouth 2 (two) times daily.     predniSONE 5 MG tablet  Commonly known as:  DELTASONE  Take 5 mg by mouth daily.     ranitidine 150 MG capsule  Commonly known as:  ZANTAC  Take 150 mg by mouth daily.     tacrolimus 1 MG capsule  Commonly known as:  PROGRAF  Take 1 mg by mouth 2 (two) times daily.       Allergies  Allergen Reactions  . Ace Inhibitors Other (See Comments)    Cough   ( Lisinopril )  . Adhesive [Tape] Itching      The results of significant diagnostics from this hospitalization (including imaging, microbiology, ancillary and laboratory) are listed below for reference.    Significant Diagnostic Studies: Dg Abd 1 View  03/23/2014   CLINICAL DATA:  Abdominal pain  EXAM: ABDOMEN - 1 VIEW  COMPARISON:  03/22/2014  FINDINGS: Several mildly distended left mid abdominal small bowel loops are smaller than previously, now maximal diameter 3.2 cm. No evidence for free air. No air-fluid level identified. No new abnormal radiopacity.  IMPRESSION: Decreased left mid abdominal small bowel distention which may indicate improving small bowel obstruction or focal ileus.   Electronically Signed   By: Christiana Pellant M.D.   On: 03/23/2014 10:14   Dg Foot Complete  Left  04/20/2014   CLINICAL DATA:  CELLULITIS TOE PAIN  EXAM: LEFT FOOT - COMPLETE 3+ VIEW  COMPARISON:  None.  FINDINGS: No acute fracture or dislocation. Joint spaces are maintained. No osseous erosions to suggest active osteomyelitis. Diffuse osteopenia present. Posterior and plantar calcaneal enthesophyte is noted. Degenerative spurring present at the dorsal talonavicular joint.  No soft tissue emphysema or other abnormality identified. Prominent vascular calcifications present.  IMPRESSION: No acute abnormality about the left foot.   Electronically Signed   By: Rise Mu M.D.   On: 04/20/2014 01:28   Dg Toe Great Left  04/20/2014   CLINICAL DATA:  CELLULITIS TOE PAIN  EXAM: LEFT GREAT TOE  COMPARISON:  None.  FINDINGS: No acute fracture or dislocation. No osseous erosions to suggest active osteomyelitis identified. Joint spaces are maintained. No soft tissue abnormality. No radiopaque foreign body. Prominent vascular calcifications noted.  IMPRESSION: 1. No radiographic evidence of active infection or osteomyelitis identified. 2. No acute fracture dislocation.   Electronically Signed   By: Rise Mu M.D.   On: 04/20/2014 01:19    Microbiology: Recent Results (from the past 240 hour(s))  MRSA PCR SCREENING     Status: None   Collection Time    04/20/14  5:14 AM      Result Value Ref Range Status   MRSA by PCR NEGATIVE  NEGATIVE Final   Comment:            The GeneXpert MRSA Assay (FDA     approved for NASAL specimens     only), is one component of a     comprehensive MRSA colonization     surveillance program. It is not     intended to diagnose MRSA     infection nor to guide or     monitor treatment for     MRSA infections.     Labs: Basic Metabolic Panel:  Recent Labs Lab 04/20/14 0035 04/20/14 0438 04/21/14 0528  NA 136* 136* 136*  K 4.2 4.3 4.4  CL 98 99 103  CO2 24 24 23   GLUCOSE 169* 100* 80  BUN 22 22 21   CREATININE 1.14* 1.12* 1.06  CALCIUM  10.3 9.7 9.4  MG  --  1.6 1.6   Liver Function Tests: No results found for this basename: AST, ALT, ALKPHOS, BILITOT, PROT, ALBUMIN,  in the last 168 hours No results found for this basename: LIPASE, AMYLASE,  in the last 168 hours No results found for this basename: AMMONIA,  in the last 168 hours CBC:  Recent Labs Lab 04/20/14 0035 04/20/14 0438 04/21/14 0528  WBC 6.0 5.0 4.2  HGB 13.8 12.8 13.1  HCT 41.3 38.9 41.4  MCV 90.4 90.7 92.2  PLT 159 PLATELET CLUMPS NOTED ON SMEAR, COUNT APPEARS ADEQUATE 147*   Cardiac Enzymes: No results found for this basename: CKTOTAL, CKMB, CKMBINDEX, TROPONINI,  in the last 168 hours BNP: BNP (last 3 results) No results found for this basename: PROBNP,  in the last 8760 hours CBG:  Recent Labs Lab 04/20/14 1229 04/20/14 1738 04/20/14 2217 04/21/14 0748 04/21/14 1150  GLUCAP 112* 240* 187* 156* 172*       Signed:  Ladavia Lindenbaum MD [facility] 04/21/2014, 2:19 PM

## 2014-04-22 NOTE — Progress Notes (Signed)
Patient has order to d/c home.  Reviewed discharge instructions with patient, patient verbalized understanding.  Copy of discharge instructions and prescription given to patient.  Patient escorted from floor by NT.  Dorothyann PengKim Leone Putman RN

## 2014-05-03 ENCOUNTER — Emergency Department (HOSPITAL_COMMUNITY): Payer: Medicare Other

## 2014-05-03 ENCOUNTER — Inpatient Hospital Stay (HOSPITAL_COMMUNITY)
Admission: EM | Admit: 2014-05-03 | Discharge: 2014-05-12 | DRG: 617 | Disposition: A | Discharge status: Home / Self Care | Payer: Medicare Other | Attending: Internal Medicine | Admitting: Internal Medicine

## 2014-05-03 ENCOUNTER — Encounter (HOSPITAL_COMMUNITY): Payer: Self-pay | Admitting: Emergency Medicine

## 2014-05-03 DIAGNOSIS — D72829 Elevated white blood cell count, unspecified: Secondary | ICD-10-CM

## 2014-05-03 DIAGNOSIS — I10 Essential (primary) hypertension: Secondary | ICD-10-CM | POA: Diagnosis present

## 2014-05-03 DIAGNOSIS — N189 Chronic kidney disease, unspecified: Secondary | ICD-10-CM

## 2014-05-03 DIAGNOSIS — E871 Hypo-osmolality and hyponatremia: Secondary | ICD-10-CM

## 2014-05-03 DIAGNOSIS — M9080 Osteopathy in diseases classified elsewhere, unspecified site: Secondary | ICD-10-CM | POA: Diagnosis present

## 2014-05-03 DIAGNOSIS — E108 Type 1 diabetes mellitus with unspecified complications: Secondary | ICD-10-CM

## 2014-05-03 DIAGNOSIS — N186 End stage renal disease: Secondary | ICD-10-CM

## 2014-05-03 DIAGNOSIS — D849 Immunodeficiency, unspecified: Secondary | ICD-10-CM

## 2014-05-03 DIAGNOSIS — Z940 Kidney transplant status: Secondary | ICD-10-CM | POA: Diagnosis not present

## 2014-05-03 DIAGNOSIS — R739 Hyperglycemia, unspecified: Secondary | ICD-10-CM

## 2014-05-03 DIAGNOSIS — Z9849 Cataract extraction status, unspecified eye: Secondary | ICD-10-CM

## 2014-05-03 DIAGNOSIS — I739 Peripheral vascular disease, unspecified: Secondary | ICD-10-CM | POA: Diagnosis present

## 2014-05-03 DIAGNOSIS — Z6834 Body mass index (BMI) 34.0-34.9, adult: Secondary | ICD-10-CM

## 2014-05-03 DIAGNOSIS — E1065 Type 1 diabetes mellitus with hyperglycemia: Secondary | ICD-10-CM | POA: Diagnosis not present

## 2014-05-03 DIAGNOSIS — IMO0002 Reserved for concepts with insufficient information to code with codable children: Secondary | ICD-10-CM | POA: Diagnosis present

## 2014-05-03 DIAGNOSIS — Z823 Family history of stroke: Secondary | ICD-10-CM | POA: Diagnosis not present

## 2014-05-03 DIAGNOSIS — T82898A Other specified complication of vascular prosthetic devices, implants and grafts, initial encounter: Secondary | ICD-10-CM

## 2014-05-03 DIAGNOSIS — M86179 Other acute osteomyelitis, unspecified ankle and foot: Secondary | ICD-10-CM | POA: Diagnosis present

## 2014-05-03 DIAGNOSIS — E8729 Other acidosis: Secondary | ICD-10-CM

## 2014-05-03 DIAGNOSIS — R197 Diarrhea, unspecified: Secondary | ICD-10-CM | POA: Diagnosis present

## 2014-05-03 DIAGNOSIS — R310 Gross hematuria: Secondary | ICD-10-CM | POA: Diagnosis not present

## 2014-05-03 DIAGNOSIS — N170 Acute kidney failure with tubular necrosis: Secondary | ICD-10-CM | POA: Diagnosis present

## 2014-05-03 DIAGNOSIS — S88119A Complete traumatic amputation at level between knee and ankle, unspecified lower leg, initial encounter: Secondary | ICD-10-CM | POA: Diagnosis not present

## 2014-05-03 DIAGNOSIS — E872 Acidosis: Secondary | ICD-10-CM

## 2014-05-03 DIAGNOSIS — L02619 Cutaneous abscess of unspecified foot: Secondary | ICD-10-CM | POA: Diagnosis present

## 2014-05-03 DIAGNOSIS — E039 Hypothyroidism, unspecified: Secondary | ICD-10-CM | POA: Diagnosis present

## 2014-05-03 DIAGNOSIS — E875 Hyperkalemia: Secondary | ICD-10-CM

## 2014-05-03 DIAGNOSIS — E1069 Type 1 diabetes mellitus with other specified complication: Principal | ICD-10-CM

## 2014-05-03 DIAGNOSIS — E1029 Type 1 diabetes mellitus with other diabetic kidney complication: Secondary | ICD-10-CM | POA: Diagnosis present

## 2014-05-03 DIAGNOSIS — Z794 Long term (current) use of insulin: Secondary | ICD-10-CM | POA: Diagnosis not present

## 2014-05-03 DIAGNOSIS — E861 Hypovolemia: Secondary | ICD-10-CM | POA: Diagnosis present

## 2014-05-03 DIAGNOSIS — E669 Obesity, unspecified: Secondary | ICD-10-CM | POA: Diagnosis present

## 2014-05-03 DIAGNOSIS — N182 Chronic kidney disease, stage 2 (mild): Secondary | ICD-10-CM | POA: Diagnosis present

## 2014-05-03 DIAGNOSIS — M79609 Pain in unspecified limb: Secondary | ICD-10-CM | POA: Diagnosis present

## 2014-05-03 DIAGNOSIS — I959 Hypotension, unspecified: Secondary | ICD-10-CM

## 2014-05-03 DIAGNOSIS — D899 Disorder involving the immune mechanism, unspecified: Secondary | ICD-10-CM

## 2014-05-03 DIAGNOSIS — E1010 Type 1 diabetes mellitus with ketoacidosis without coma: Secondary | ICD-10-CM | POA: Diagnosis present

## 2014-05-03 DIAGNOSIS — L03116 Cellulitis of left lower limb: Secondary | ICD-10-CM

## 2014-05-03 DIAGNOSIS — I129 Hypertensive chronic kidney disease with stage 1 through stage 4 chronic kidney disease, or unspecified chronic kidney disease: Secondary | ICD-10-CM | POA: Diagnosis present

## 2014-05-03 DIAGNOSIS — L03119 Cellulitis of unspecified part of limb: Secondary | ICD-10-CM

## 2014-05-03 DIAGNOSIS — N179 Acute kidney failure, unspecified: Secondary | ICD-10-CM

## 2014-05-03 DIAGNOSIS — M869 Osteomyelitis, unspecified: Secondary | ICD-10-CM | POA: Diagnosis present

## 2014-05-03 LAB — CBC
HCT: 44.1 % (ref 36.0–46.0)
HEMOGLOBIN: 14.9 g/dL (ref 12.0–15.0)
MCH: 30.7 pg (ref 26.0–34.0)
MCHC: 33.8 g/dL (ref 30.0–36.0)
MCV: 90.9 fL (ref 78.0–100.0)
Platelets: 217 10*3/uL (ref 150–400)
RBC: 4.85 MIL/uL (ref 3.87–5.11)
RDW: 13.2 % (ref 11.5–15.5)
WBC: 6.1 10*3/uL (ref 4.0–10.5)

## 2014-05-03 LAB — COMPREHENSIVE METABOLIC PANEL
ALT: 16 U/L (ref 0–35)
AST: 14 U/L (ref 0–37)
Albumin: 3.7 g/dL (ref 3.5–5.2)
Alkaline Phosphatase: 112 U/L (ref 39–117)
Anion gap: 14 (ref 5–15)
BUN: 32 mg/dL — ABNORMAL HIGH (ref 6–23)
CO2: 23 meq/L (ref 19–32)
CREATININE: 1.24 mg/dL — AB (ref 0.50–1.10)
Calcium: 10.5 mg/dL (ref 8.4–10.5)
Chloride: 98 mEq/L (ref 96–112)
GFR, EST AFRICAN AMERICAN: 59 mL/min — AB (ref >90)
GFR, EST NON AFRICAN AMERICAN: 51 mL/min — AB (ref >90)
Glucose, Bld: 286 mg/dL — ABNORMAL HIGH (ref 70–99)
Potassium: 5.1 mEq/L (ref 3.7–5.3)
Sodium: 135 mEq/L — ABNORMAL LOW (ref 137–147)
TOTAL PROTEIN: 7.9 g/dL (ref 6.0–8.3)
Total Bilirubin: 0.6 mg/dL (ref 0.3–1.2)

## 2014-05-03 LAB — CBG MONITORING, ED: Glucose-Capillary: 274 mg/dL — ABNORMAL HIGH (ref 70–99)

## 2014-05-03 MED ORDER — ACETAMINOPHEN 325 MG PO TABS
650.0000 mg | ORAL_TABLET | Freq: Four times a day (QID) | ORAL | Status: DC | PRN
  Administered 2014-05-06 – 2014-05-12 (×6): 650 mg via ORAL
  Filled 2014-05-03 (×7): qty 2

## 2014-05-03 MED ORDER — LEVOTHYROXINE SODIUM 137 MCG PO TABS
137.0000 ug | ORAL_TABLET | Freq: Every day | ORAL | Status: DC
  Administered 2014-05-04 – 2014-05-12 (×8): 137 ug via ORAL
  Filled 2014-05-03 (×10): qty 1

## 2014-05-03 MED ORDER — INSULIN ASPART 100 UNIT/ML ~~LOC~~ SOLN
0.0000 [IU] | Freq: Three times a day (TID) | SUBCUTANEOUS | Status: DC
  Administered 2014-05-04: 9 [IU] via SUBCUTANEOUS
  Administered 2014-05-05: 1 [IU] via SUBCUTANEOUS
  Administered 2014-05-05: 2 [IU] via SUBCUTANEOUS
  Administered 2014-05-06: 5 [IU] via SUBCUTANEOUS
  Administered 2014-05-06: 3 [IU] via SUBCUTANEOUS
  Administered 2014-05-06: 2 [IU] via SUBCUTANEOUS
  Administered 2014-05-07: 5 [IU] via SUBCUTANEOUS
  Administered 2014-05-07: 7 [IU] via SUBCUTANEOUS
  Administered 2014-05-08: 2 [IU] via SUBCUTANEOUS
  Administered 2014-05-08: 3 [IU] via SUBCUTANEOUS
  Administered 2014-05-08: 1 [IU] via SUBCUTANEOUS
  Administered 2014-05-09: 3 [IU] via SUBCUTANEOUS
  Administered 2014-05-10: 2 [IU] via SUBCUTANEOUS
  Administered 2014-05-10: 5 [IU] via SUBCUTANEOUS
  Administered 2014-05-11: 7 [IU] via SUBCUTANEOUS

## 2014-05-03 MED ORDER — PREDNISONE 5 MG PO TABS
5.0000 mg | ORAL_TABLET | Freq: Every day | ORAL | Status: DC
  Administered 2014-05-04 – 2014-05-12 (×8): 5 mg via ORAL
  Filled 2014-05-03 (×9): qty 1

## 2014-05-03 MED ORDER — ONDANSETRON HCL 4 MG PO TABS
4.0000 mg | ORAL_TABLET | Freq: Four times a day (QID) | ORAL | Status: DC | PRN
  Administered 2014-05-06: 4 mg via ORAL
  Filled 2014-05-03 (×2): qty 1

## 2014-05-03 MED ORDER — VANCOMYCIN HCL IN DEXTROSE 1-5 GM/200ML-% IV SOLN
1000.0000 mg | Freq: Two times a day (BID) | INTRAVENOUS | Status: DC
Start: 2014-05-04 — End: 2014-05-04
  Filled 2014-05-03 (×2): qty 200

## 2014-05-03 MED ORDER — DEXTROSE 5 % IV SOLN
1.0000 g | Freq: Three times a day (TID) | INTRAVENOUS | Status: DC
  Administered 2014-05-04 (×2): 1 g via INTRAVENOUS
  Filled 2014-05-03 (×2): qty 1

## 2014-05-03 MED ORDER — ONDANSETRON HCL 4 MG/2ML IJ SOLN
4.0000 mg | Freq: Once | INTRAMUSCULAR | Status: AC
  Administered 2014-05-03: 4 mg via INTRAVENOUS
  Filled 2014-05-03: qty 2

## 2014-05-03 MED ORDER — MYCOPHENOLATE MOFETIL 250 MG PO CAPS
500.0000 mg | ORAL_CAPSULE | Freq: Two times a day (BID) | ORAL | Status: DC
  Administered 2014-05-04 – 2014-05-12 (×18): 500 mg via ORAL
  Filled 2014-05-03 (×19): qty 2

## 2014-05-03 MED ORDER — PIPERACILLIN-TAZOBACTAM 3.375 G IVPB 30 MIN
3.3750 g | Freq: Once | INTRAVENOUS | Status: AC
  Administered 2014-05-03: 3.375 g via INTRAVENOUS
  Filled 2014-05-03: qty 50

## 2014-05-03 MED ORDER — AMLODIPINE BESYLATE 5 MG PO TABS
5.0000 mg | ORAL_TABLET | Freq: Every day | ORAL | Status: DC
  Administered 2014-05-04: 5 mg via ORAL
  Filled 2014-05-03: qty 1

## 2014-05-03 MED ORDER — MORPHINE SULFATE 4 MG/ML IJ SOLN
6.0000 mg | Freq: Once | INTRAMUSCULAR | Status: AC
  Administered 2014-05-03: 6 mg via INTRAVENOUS
  Filled 2014-05-03: qty 2

## 2014-05-03 MED ORDER — ONDANSETRON HCL 4 MG/2ML IJ SOLN
4.0000 mg | Freq: Four times a day (QID) | INTRAMUSCULAR | Status: DC | PRN
  Administered 2014-05-03 – 2014-05-07 (×3): 4 mg via INTRAVENOUS
  Filled 2014-05-03 (×4): qty 2

## 2014-05-03 MED ORDER — VANCOMYCIN HCL 10 G IV SOLR
2250.0000 mg | Freq: Once | INTRAVENOUS | Status: AC
  Administered 2014-05-03: 2250 mg via INTRAVENOUS
  Filled 2014-05-03: qty 2250

## 2014-05-03 MED ORDER — INSULIN ASPART PROT & ASPART (70-30 MIX) 100 UNIT/ML ~~LOC~~ SUSP
25.0000 [IU] | Freq: Two times a day (BID) | SUBCUTANEOUS | Status: DC
  Administered 2014-05-04: 25 [IU] via SUBCUTANEOUS
  Filled 2014-05-03: qty 10

## 2014-05-03 MED ORDER — CARVEDILOL 25 MG PO TABS
50.0000 mg | ORAL_TABLET | Freq: Two times a day (BID) | ORAL | Status: DC
  Administered 2014-05-04 (×2): 50 mg via ORAL
  Filled 2014-05-03 (×3): qty 2

## 2014-05-03 MED ORDER — ACETAMINOPHEN 650 MG RE SUPP
650.0000 mg | Freq: Four times a day (QID) | RECTAL | Status: DC | PRN
  Filled 2014-05-03: qty 1

## 2014-05-03 MED ORDER — SODIUM CHLORIDE 0.9 % IV BOLUS (SEPSIS)
1000.0000 mL | Freq: Once | INTRAVENOUS | Status: AC
  Administered 2014-05-03: 1000 mL via INTRAVENOUS

## 2014-05-03 MED ORDER — HYDROMORPHONE HCL PF 1 MG/ML IJ SOLN
1.0000 mg | INTRAMUSCULAR | Status: DC | PRN
  Administered 2014-05-04 – 2014-05-12 (×17): 1 mg via INTRAVENOUS
  Filled 2014-05-03 (×18): qty 1

## 2014-05-03 MED ORDER — FAMOTIDINE 20 MG PO TABS
20.0000 mg | ORAL_TABLET | Freq: Every day | ORAL | Status: DC
  Administered 2014-05-04 – 2014-05-12 (×8): 20 mg via ORAL
  Filled 2014-05-03 (×9): qty 1

## 2014-05-03 MED ORDER — TACROLIMUS 1 MG PO CAPS
1.0000 mg | ORAL_CAPSULE | Freq: Two times a day (BID) | ORAL | Status: DC
  Administered 2014-05-04 – 2014-05-12 (×18): 1 mg via ORAL
  Filled 2014-05-03 (×19): qty 1

## 2014-05-03 MED ORDER — ENOXAPARIN SODIUM 60 MG/0.6ML ~~LOC~~ SOLN
50.0000 mg | SUBCUTANEOUS | Status: DC
  Administered 2014-05-04 (×2): 50 mg via SUBCUTANEOUS
  Filled 2014-05-03 (×3): qty 0.6

## 2014-05-03 MED ORDER — SODIUM CHLORIDE 0.9 % IJ SOLN
3.0000 mL | Freq: Two times a day (BID) | INTRAMUSCULAR | Status: DC
  Administered 2014-05-09 – 2014-05-11 (×4): 3 mL via INTRAVENOUS
  Filled 2014-05-03: qty 3

## 2014-05-03 NOTE — Progress Notes (Addendum)
ANTIBIOTIC CONSULT NOTE - INITIAL  Pharmacy Consult for Vancomycin Indication: Osteomyelitis  Allergies  Allergen Reactions  . Ace Inhibitors Other (See Comments)    Cough   ( Lisinopril )  . Adhesive [Tape] Itching    Patient Measurements:   Adjusted Body Weight: 101 kg (04/20/14)  Vital Signs: Temp: 98.1 F (36.7 C) (08/27 1754) Temp src: Oral (08/27 1754) BP: 148/73 mmHg (08/27 1754) Pulse Rate: 76 (08/27 1754) Intake/Output from previous day:   Intake/Output from this shift:    Labs:  Recent Labs  05/03/14 1808 05/03/14 1845  WBC  --  6.1  HGB  --  14.9  PLT  --  217  CREATININE 1.24*  --    The CrCl is unknown because both a height and weight (above a minimum accepted value) are required for this calculation. No results found for this basename: VANCOTROUGH, Leodis Binet, VANCORANDOM, GENTTROUGH, GENTPEAK, GENTRANDOM, TOBRATROUGH, TOBRAPEAK, TOBRARND, AMIKACINPEAK, AMIKACINTROU, AMIKACIN,  in the last 72 hours   Microbiology: Recent Results (from the past 720 hour(s))  MRSA PCR SCREENING     Status: None   Collection Time    04/20/14  5:14 AM      Result Value Ref Range Status   MRSA by PCR NEGATIVE  NEGATIVE Final   Comment:            The GeneXpert MRSA Assay (FDA     approved for NASAL specimens     only), is one component of a     comprehensive MRSA colonization     surveillance program. It is not     intended to diagnose MRSA     infection nor to guide or     monitor treatment for     MRSA infections.    Medical History: Past Medical History  Diagnosis Date  . Hypertension   . Hypothyroid   . Depression   . 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 Kidney disease   . End stage renal disease     right arm AV graft, post transplant  . Diabetes mellitus     Insulin dependant    Assessment: 48yo F with h/o DM, renal failure s/p kidney transplant in 2011, toe amputation. C/o left big toe swelling, pain, redness x 3 days.    8/27 >>Vanco >> 8/27 >> zosyn >>  Tmax: afebrile WBCs: WNL Renal: SCr 1.24 CrCl 1ml/min N  Goal of Therapy:  Vancomycin trough level 15-20 mcg/ml  Plan:  Start Vancomycin 2.25g IV x1 then Vancomycin 1g IV Q12H F/u renal function Measure antibiotic drug levels at steady state Follow up culture results  Loma Boston, PharmD Pager: [phone] 05/03/2014 8:03 PM   Addendum: Pharmacy also consulted to dose Cefepime for osteomyelitis.  Start Cefepime 1g IV Q8H Continue to follow renal function and cultures  Loma Boston, PharmD Pager: [phone] 05/03/2014 10:00 PM

## 2014-05-03 NOTE — H&P (Signed)
[facility] History and Physical  [name]:[id number] DOB: Aug 13, 1966 DOA: 05/03/2014  Referring physician: ER physician. PCP: [name], MD   Chief Complaint: Left great toe pain.  HPI: [name] is a 48 y.o. female with history of renal transplant, hypertension, hypothyroidism, diabetes mellitus type 1 who was recently admitted for cellulitis of the left foot and toe presents to the ER with complaints of pain in the left to last 2 days. Denies any trauma or fall. In the ER x-rays showed features concerning for osteomyelitis and has been admitted for IV antibiotics and further management. Patient denies any fever chills. Patient has been having some diffuse abdominal discomfort since yesterday. Patient has been having recurrent episodes of diarrhea today. Denies any nausea vomiting. Denies any blood in the diarrhea. Denies chest pain or shortness of breath.   Review of Systems: As presented in the history of presenting illness, rest negative.  Past Medical History  Diagnosis Date  . Hypertension   . Hypothyroid   . Depression   . 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 Kidney disease   . End stage renal disease     right arm AV graft, post transplant  . Diabetes mellitus     Insulin dependant   Past Surgical History  Procedure Laterality Date  . Back surgery  2001  . Tubal ligation    . Dg av dialysis graft declot or    . Nephrectomy transplanted organ    . Kidney transplant  December 16, 2009  . 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  . Cataract extraction w/ intraocular lens implant Right    Social History:  reports that she quit smoking about 15 years ago. Her smoking use included Cigarettes. She has a 11 pack-year smoking history. She has never used smokeless tobacco. She reports that she drinks alcohol. She reports that she does not use illicit drugs. Where does patient live home. Can patient participate in ADLs?  Yes.  Allergies  Allergen Reactions  . Ace Inhibitors Other (See Comments)    Cough   ( Lisinopril )  . Adhesive [Tape] Itching    Family History:  Family History  Problem Relation Age of Onset  . Emphysema Mother   . Throat cancer Mother   . COPD Mother   . Cancer Mother   . Emphysema Father   . COPD Father   . Stroke Father   . ADD / ADHD Son       Prior to Admission medications   Medication Sig Start Date End Date Taking? Authorizing Provider  amLODipine (NORVASC) 5 MG tablet Take 1 tablet (5 mg total) by mouth daily. 08/08/12  Yes Alison Murray, MD  carvedilol (COREG) 25 MG tablet Take 2 tablets (50 mg total) by mouth 2 (two) times daily. 08/08/12  Yes Alison Murray, MD  doxycycline (VIBRAMYCIN) 100 MG capsule Take 1 capsule (100 mg total) by mouth 2 (two) times daily. Take for 7 days then stop. 04/21/14  Yes Rodolph Bong, MD  HYDROcodone-acetaminophen (NORCO/VICODIN) 5-325 MG per tablet Take 1-2 tablets by mouth every 4 (four) hours as needed for moderate pain. 04/21/14  Yes Rodolph Bong, MD  insulin aspart protamine- aspart (NOVOLOG MIX 70/30) (70-30) 100 UNIT/ML injection Inject 0.25 mLs (25 Units total) into the skin 2 (two) times daily with a meal. 03/23/14  Yes Rodolph Bong, MD  levothyroxine (SYNTHROID, LEVOTHROID) 137 MCG tablet Take 137 mcg by mouth  daily. Name brand   Yes Historical Provider, MD  Multiple Vitamin (MULTIVITAMIN WITH MINERALS) TABS tablet Take 1 tablet by mouth daily.   Yes Historical Provider, MD  mycophenolate (CELLCEPT) 250 MG capsule Take 500 mg by mouth 2 (two) times daily.    Yes Historical Provider, MD  predniSONE (DELTASONE) 5 MG tablet Take 5 mg by mouth daily.     Yes Historical Provider, MD  ranitidine (ZANTAC) 150 MG capsule Take 150 mg by mouth daily.    Yes Historical Provider, MD  tacrolimus (PROGRAF) 1 MG capsule Take 1 mg by mouth 2 (two) times daily.    Yes Historical Provider, MD    Physical Exam: Filed Vitals:    05/03/14 1754 05/03/14 2056  BP: 148/73 138/66  Pulse: 76 72  Temp: 98.1 F (36.7 C) 97.7 F (36.5 C)  TempSrc: Oral Oral  Resp: 18 20  SpO2: 100% 96%     General:  Well-built and nourished.  Eyes: Anicteric no pallor.  ENT: No discharge from ears eyes nose mouth.  Neck: No mass felt.  Cardiovascular: S1-S2 heard.  Respiratory: No rhonchi or crepitations.  Abdomen: Soft nontender bowel sounds present. No guarding rigidity.  Skin: Left foot great toe has a small ulcer on the plantar aspect no discharge.  Musculoskeletal: Patient has toes amputated in the right foot.  Psychiatric: Appears normal.  Neurologic: Alert awake oriented to time place and person. Moves all extremities.  Labs on Admission:  Basic Metabolic Panel:  Recent Labs Lab 05/03/14 1808  NA 135*  K 5.1  CL 98  CO2 23  GLUCOSE 286*  BUN 32*  CREATININE 1.24*  CALCIUM 10.5   Liver Function Tests:  Recent Labs Lab 05/03/14 1808  AST 14  ALT 16  ALKPHOS 112  BILITOT 0.6  PROT 7.9  ALBUMIN 3.7   No results found for this basename: LIPASE, AMYLASE,  in the last 168 hours No results found for this basename: AMMONIA,  in the last 168 hours CBC:  Recent Labs Lab 05/03/14 1845  WBC 6.1  HGB 14.9  HCT 44.1  MCV 90.9  PLT 217   Cardiac Enzymes: No results found for this basename: CKTOTAL, CKMB, CKMBINDEX, TROPONINI,  in the last 168 hours  BNP (last 3 results) No results found for this basename: PROBNP,  in the last 8760 hours CBG:  Recent Labs Lab 05/03/14 1805  GLUCAP 274*    Radiological Exams on Admission: Dg Toe Great Left  05/03/2014   CLINICAL DATA:  Diabetic wound  EXAM: LEFT GREAT TOE  COMPARISON:  April 20, 2014.  FINDINGS: Frontal, oblique, and lateral views were obtained. There is subtle osteomyelitis along the distal most aspect of the first distal phalanx. No other bony destruction. There is evidence of an old fracture with remodeling involving the second  proximal phalanx. There is narrowing of all PIP and DIP joints. There is extensive arterial vascular calcification. No acute fracture or dislocation.  IMPRESSION: Subtle osteomyelitis involving the distal most aspect of the first distal phalanx.   Electronically Signed   By: Bretta Bang M.D.   On: 05/03/2014 18:46     Assessment/Plan Principal Problem:   Osteomyelitis of toe of left foot Active Problems:   HYPERTENSION   DKA, type 1   Hypothyroidism   H/O kidney transplant   Diarrhea   Osteomyelitis   1. Osteomyelitis of the left foot great toe - patient has been empirically placed on vancomycin and cefepime. Consult orthopedic in a.m. Patient  is known to Dr. [name]. Check sedimentation rate. Continue pain medications. 2. Abdominal pain or diarrhea - suspect antibiotic associated diarrhea. Check stool for C. difficile. Check lipase and KUB. Abdomen appears benign on exam. 3. History of renal transplant - continue immunosuppressants. 4. Diabetes mellitus1 uncontrolled - closely follow CBGs with present dose of insulin. Blood sugar appears uncontrolled. Follow metabolic panel for any worsening of anion gap. 5. Hypothyroidism - continue Synthroid. 6. Hypertension - continue present medication.    Code Status: Full code.  Family Communication: None.  Disposition Plan: Admit to inpatient.    Maraya Gwilliam N. [facility] Pager [phone].  If 7PM-7AM, please contact night-coverage [email or web address] Password [email or web address] 05/03/2014, 9:22 PM

## 2014-05-03 NOTE — ED Notes (Signed)
Pt reports L big toe pain with a sore.  Pt reports she was recently put on abx without relief.  Pt reports sore is getting worse.  Pt is diabetic.

## 2014-05-03 NOTE — ED Notes (Addendum)
Pt hx of DM, was seen on 8/13 for same complaint of a blister on left big toe. Pt reports toe pain increased yesterday. Pt has black/brown spot on bottom of left big toe. Pain 7/10. Reports vomited this morning. Pt has been on doxycycline, last dose due tomorrow.

## 2014-05-04 ENCOUNTER — Inpatient Hospital Stay (HOSPITAL_COMMUNITY): Payer: Medicare Other

## 2014-05-04 DIAGNOSIS — E039 Hypothyroidism, unspecified: Secondary | ICD-10-CM

## 2014-05-04 DIAGNOSIS — L03119 Cellulitis of unspecified part of limb: Secondary | ICD-10-CM

## 2014-05-04 DIAGNOSIS — L02619 Cutaneous abscess of unspecified foot: Secondary | ICD-10-CM

## 2014-05-04 DIAGNOSIS — N179 Acute kidney failure, unspecified: Secondary | ICD-10-CM

## 2014-05-04 DIAGNOSIS — I10 Essential (primary) hypertension: Secondary | ICD-10-CM

## 2014-05-04 LAB — CBC WITH DIFFERENTIAL/PLATELET
BASOS ABS: 0 10*3/uL (ref 0.0–0.1)
Basophils Relative: 0 % (ref 0–1)
Eosinophils Absolute: 0.1 10*3/uL (ref 0.0–0.7)
Eosinophils Relative: 1 % (ref 0–5)
HEMATOCRIT: 41.6 % (ref 36.0–46.0)
Hemoglobin: 13.5 g/dL (ref 12.0–15.0)
Lymphocytes Relative: 2 % — ABNORMAL LOW (ref 12–46)
Lymphs Abs: 0.2 10*3/uL — ABNORMAL LOW (ref 0.7–4.0)
MCH: 29.8 pg (ref 26.0–34.0)
MCHC: 32.5 g/dL (ref 30.0–36.0)
MCV: 91.8 fL (ref 78.0–100.0)
MONOS PCT: 9 % (ref 3–12)
Monocytes Absolute: 0.8 10*3/uL (ref 0.1–1.0)
NEUTROS ABS: 7.6 10*3/uL (ref 1.7–7.7)
Neutrophils Relative %: 88 % — ABNORMAL HIGH (ref 43–77)
Platelets: 172 10*3/uL (ref 150–400)
RBC: 4.53 MIL/uL (ref 3.87–5.11)
RDW: 13.6 % (ref 11.5–15.5)
WBC: 8.7 10*3/uL (ref 4.0–10.5)

## 2014-05-04 LAB — COMPREHENSIVE METABOLIC PANEL
ALBUMIN: 2.8 g/dL — AB (ref 3.5–5.2)
ALT: 12 U/L (ref 0–35)
AST: 13 U/L (ref 0–37)
Alkaline Phosphatase: 78 U/L (ref 39–117)
Anion gap: 15 (ref 5–15)
BUN: 34 mg/dL — AB (ref 6–23)
CALCIUM: 9.3 mg/dL (ref 8.4–10.5)
CO2: 18 mEq/L — ABNORMAL LOW (ref 19–32)
CREATININE: 1.72 mg/dL — AB (ref 0.50–1.10)
Chloride: 101 mEq/L (ref 96–112)
GFR calc Af Amer: 40 mL/min — ABNORMAL LOW (ref >90)
GFR calc non Af Amer: 34 mL/min — ABNORMAL LOW (ref >90)
Glucose, Bld: 336 mg/dL — ABNORMAL HIGH (ref 70–99)
Potassium: 4.8 mEq/L (ref 3.7–5.3)
Sodium: 134 mEq/L — ABNORMAL LOW (ref 137–147)
Total Bilirubin: 0.6 mg/dL (ref 0.3–1.2)
Total Protein: 6.2 g/dL (ref 6.0–8.3)

## 2014-05-04 LAB — BASIC METABOLIC PANEL
ANION GAP: 15 (ref 5–15)
BUN: 36 mg/dL — ABNORMAL HIGH (ref 6–23)
CHLORIDE: 102 meq/L (ref 96–112)
CO2: 20 mEq/L (ref 19–32)
Calcium: 9.7 mg/dL (ref 8.4–10.5)
Creatinine, Ser: 2.59 mg/dL — ABNORMAL HIGH (ref 0.50–1.10)
GFR, EST AFRICAN AMERICAN: 24 mL/min — AB (ref >90)
GFR, EST NON AFRICAN AMERICAN: 21 mL/min — AB (ref >90)
Glucose, Bld: 88 mg/dL (ref 70–99)
Potassium: 4.3 mEq/L (ref 3.7–5.3)
SODIUM: 137 meq/L (ref 137–147)

## 2014-05-04 LAB — GLUCOSE, CAPILLARY
GLUCOSE-CAPILLARY: 381 mg/dL — AB (ref 70–99)
GLUCOSE-CAPILLARY: 94 mg/dL (ref 70–99)
GLUCOSE-CAPILLARY: 115 mg/dL — AB (ref 70–99)
GLUCOSE-CAPILLARY: 117 mg/dL — AB (ref 70–99)
Glucose-Capillary: 226 mg/dL — ABNORMAL HIGH (ref 70–99)
Glucose-Capillary: 42 mg/dL — CL (ref 70–99)
Glucose-Capillary: 49 mg/dL — ABNORMAL LOW (ref 70–99)
Glucose-Capillary: 57 mg/dL — ABNORMAL LOW (ref 70–99)

## 2014-05-04 LAB — LIPASE, BLOOD: Lipase: 8 U/L — ABNORMAL LOW (ref 11–59)

## 2014-05-04 LAB — SEDIMENTATION RATE: SED RATE: 2 mm/h (ref 0–22)

## 2014-05-04 LAB — CLOSTRIDIUM DIFFICILE BY PCR: CDIFFPCR: NEGATIVE

## 2014-05-04 LAB — SURGICAL PCR SCREEN
MRSA, PCR: NEGATIVE
STAPHYLOCOCCUS AUREUS: NEGATIVE

## 2014-05-04 LAB — PREGNANCY, URINE: Preg Test, Ur: NEGATIVE

## 2014-05-04 MED ORDER — DEXTROSE 5 % IV SOLN
2.0000 g | INTRAVENOUS | Status: DC
  Administered 2014-05-05 – 2014-05-06 (×2): 2 g via INTRAVENOUS
  Filled 2014-05-04 (×3): qty 2

## 2014-05-04 MED ORDER — SODIUM BICARBONATE 650 MG PO TABS
650.0000 mg | ORAL_TABLET | Freq: Two times a day (BID) | ORAL | Status: DC
  Administered 2014-05-04 – 2014-05-06 (×5): 650 mg via ORAL
  Filled 2014-05-04 (×6): qty 1

## 2014-05-04 MED ORDER — INSULIN ASPART PROT & ASPART (70-30 MIX) 100 UNIT/ML ~~LOC~~ SUSP
35.0000 [IU] | Freq: Two times a day (BID) | SUBCUTANEOUS | Status: DC
  Administered 2014-05-04: 35 [IU] via SUBCUTANEOUS

## 2014-05-04 MED ORDER — GLUCOSE 40 % PO GEL
1.0000 | ORAL | Status: DC | PRN
  Administered 2014-05-04: 37.5 g via ORAL
  Filled 2014-05-04 (×3): qty 1

## 2014-05-04 MED ORDER — SODIUM CHLORIDE 0.9 % IV SOLN
INTRAVENOUS | Status: DC
  Administered 2014-05-04 – 2014-05-08 (×9): via INTRAVENOUS

## 2014-05-04 MED ORDER — LOPERAMIDE HCL 2 MG PO CAPS
2.0000 mg | ORAL_CAPSULE | ORAL | Status: DC | PRN
  Administered 2014-05-04 – 2014-05-05 (×4): 2 mg via ORAL
  Filled 2014-05-04 (×5): qty 1

## 2014-05-04 MED ORDER — PANTOPRAZOLE SODIUM 40 MG PO TBEC
40.0000 mg | DELAYED_RELEASE_TABLET | Freq: Every day | ORAL | Status: DC
  Administered 2014-05-05 – 2014-05-12 (×7): 40 mg via ORAL
  Filled 2014-05-04 (×8): qty 1

## 2014-05-04 MED ORDER — VANCOMYCIN HCL IN DEXTROSE 750-5 MG/150ML-% IV SOLN
750.0000 mg | Freq: Two times a day (BID) | INTRAVENOUS | Status: DC
  Administered 2014-05-04 (×2): 750 mg via INTRAVENOUS
  Filled 2014-05-04 (×3): qty 150

## 2014-05-04 MED ORDER — DEXTROSE 50 % IV SOLN
50.0000 mL | Freq: Once | INTRAVENOUS | Status: AC | PRN
  Administered 2014-05-04: 50 mL via INTRAVENOUS
  Filled 2014-05-04: qty 50

## 2014-05-04 NOTE — Consult Note (Signed)
Renal Service Consult Note [facility]  [name] 05/04/2014 [name] Requesting Physician:  Dr Gwenlyn Perking  Reason for Consult:  Renal transplant patient with osteo L toe, rise in creatinine HPI: The patient is a 48 y.o. year-old with hx of DM 1, recurrent DKA, renal transplant in 2011.  Presented to ED yesterday with blister on left big toe and increasing pain. Black spot on bottom of toe. Was on po abx doxycycline.  Plain films showed evidence of osteomyelitis and pt was admitted and started on IV abx.  Creat was 1.24 on admit and up to 1.72 today.  Last creat on 8/15 was 1.06.  She takes IS medication w Cellcept 500 mg bid, pred 5 mg daily, and Prograf 1 mg bid.    Patient +for diarrhea, no n/v, no abd pain or CP, no sob , cough or jt pain.  No fevers or chills.     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, not started on HD yet, did not need it 04/06 - Hypoglycemia, foot ulcer 10/06 - Uncont DM, low BS, severe anemia, ESRD on HD, PVD hx R toe amps 08/08 - Hyperkalemia adn vol excess d/t missed HD, ESRD, conjunctivitis, DM 10/08 - HONK, diarrhea viral 09/10 - AMS , HONK, ESRD, high K, pulm edema 01/11 - HONK, ESRD on HD, low t4 03/11 - HONK, HTN crisis, ESRD on HD, missed HD 04/12 - cellulitis L lower leg, ESRD post renal transplant Apr 2011 06/12 - celllulitis left leg worsening, ulcer, enterococcus from wound cx, ESRD on hd, DM1 09/12 - RLE cellulitis, ESRD, renal Tx, HTN DM 01/13 - DKA, type I, DM, ESRD , renal Tx 03/13 - HCAP, renal Tx 05/13 - DKA, DM1, high K, renal Tx Apr 2011 11/13 - DKA, DM, esrd 05/14 -  DKA , esrd, renal Tx 06/14 - acute renal failure, renal Tx, DM, high Ca > creat down to 1.0 on dc, also DM, high Ca resolved with IVF 09/14 - DKA, hx kidney tx, low Na 11/14 - DKA, IDDM, low Na, s/p renal Tx  07/15 - DKA, DM1, ow BP improved. Acute renal failure of renal Tx, improved with IVF and holding BP meds 08/15 - Cellulitis left foot, HTN, renal Tx, DM   ROS  neg  Past Medical History  Past Medical History  Diagnosis Date  . Hypertension   . Hypothyroid   . Depression   . 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 Kidney disease   . End stage renal disease     right arm AV graft, post transplant  . Diabetes mellitus     Insulin dependant   Past Surgical History  Past Surgical History  Procedure Laterality Date  . Back surgery  2001  . Tubal ligation    . Dg av dialysis graft declot or    . Nephrectomy transplanted organ    . Kidney transplant  December 16, 2009  . 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  . Cataract extraction w/ intraocular lens implant Right    Family  History  Family History  Problem Relation Age of Onset  . Emphysema Mother   . Throat cancer Mother   . COPD Mother   . Cancer Mother   . Emphysema Father   . COPD Father   . Stroke Father   . ADD / ADHD Son    Social History  reports that she quit smoking about 15 years ago. Her smoking use included Cigarettes. She has a 11 pack-year smoking history. She has never used smokeless tobacco. She reports that she drinks alcohol. She reports that she does not use illicit drugs. Allergies  Allergies  Allergen Reactions  . Ace Inhibitors Other (See Comments)    Cough   ( Lisinopril )  . Adhesive [Tape] Itching   Home medications Prior to Admission medications   Medication Sig Start Date End Date Taking? Authorizing Provider  amLODipine (NORVASC) 5 MG tablet Take 1 tablet (5 mg total) by mouth daily. 08/08/12  Yes Alison Murray, MD  carvedilol (COREG) 25 MG tablet Take 2 tablets (50 mg  total) by mouth 2 (two) times daily. 08/08/12  Yes Alison Murray, MD  doxycycline (VIBRAMYCIN) 100 MG capsule Take 1 capsule (100 mg total) by mouth 2 (two) times daily. Take for 7 days then stop. 04/21/14  Yes Rodolph Bong, MD  HYDROcodone-acetaminophen (NORCO/VICODIN) 5-325 MG per tablet Take 1-2 tablets by mouth every 4 (four) hours as needed for moderate pain. 04/21/14  Yes Rodolph Bong, MD  insulin aspart protamine- aspart (NOVOLOG MIX 70/30) (70-30) 100 UNIT/ML injection Inject 0.25 mLs (25 Units total) into the skin 2 (two) times daily with a meal. 03/23/14  Yes Rodolph Bong, MD  levothyroxine (SYNTHROID, LEVOTHROID) 137 MCG tablet Take 137 mcg by mouth daily. Name brand   Yes Historical Provider, MD  Multiple Vitamin (MULTIVITAMIN WITH MINERALS) TABS tablet Take 1 tablet by mouth daily.   Yes Historical Provider, MD  mycophenolate (CELLCEPT) 250 MG capsule Take 500 mg by mouth 2 (two) times daily.    Yes Historical Provider, MD  predniSONE (DELTASONE) 5 MG tablet Take 5 mg by mouth daily.     Yes Historical Provider, MD  ranitidine (ZANTAC) 150 MG capsule Take 150 mg by mouth daily.    Yes Historical Provider, MD  tacrolimus (PROGRAF) 1 MG capsule Take 1 mg by mouth 2 (two) times daily.    Yes Historical Provider, MD   Liver Function Tests  Recent Labs Lab 05/03/14 1808 05/04/14 0525  AST 14 13  ALT 16 12  ALKPHOS 112 78  BILITOT 0.6 0.6  PROT 7.9 6.2  ALBUMIN 3.7 2.8*    Recent Labs Lab 05/04/14 0525  LIPASE 8*   CBC  Recent Labs Lab 05/03/14 1845 05/04/14 0525  WBC 6.1 8.7  NEUTROABS  --  7.6  HGB 14.9 13.5  HCT 44.1 41.6  MCV 90.9 91.8  PLT 217 172   Basic Metabolic Panel  Recent Labs Lab 05/03/14 1808 05/04/14 0525  NA 135* 134*  K 5.1 4.8  CL 98 101  CO2 23 18*  GLUCOSE 286* 336*  BUN 32* 34*  CREATININE 1.24* 1.72*  CALCIUM 10.5 9.3    Filed Vitals:   05/03/14 2056 05/03/14 2141 05/03/14 2244 05/04/14 0538  BP: 138/66 136/79   126/47  Pulse: 72 75  79  Temp: 97.7 F (36.5 C) 97.6 F (36.4 C)  98.2 F (36.8 C)  TempSrc: Oral Oral  Oral  Resp: Height:   (1.727 m)   Weight:   101.5 kg (223 lb 12.3 oz)   SpO2: 96% 95%  95%   Exam Alert, pleasant , in no distress No rash, cyanosis or gangrene Sclera anicteric, throat clear No jvd Chest is clear bilat RRR no MRG Abd obese, NTND, no HSM, RLQ renal tx palpable, nontender No LE edema either side No UE edema Neuro is fully alert and Ox 3, nf  UA none yet   Assessment: 1 Acute on CRF of renal transplant- looks a little dry and BP is down a little. Will increase fluids. Not excited about giving IV vanc w ARF , if creat continues to rise would substitute with another drug for MRSA coverage. Will hold BP meds for now, let BP come up. Avoid all IV dye, nsaids, acei/arb's. 2 L great toe osteomyelitis 3 Renal Tx 2011 on prograf, Cellcept and prednisone, continue 4 DM type 1   Plan- as above  Vinson Moselle MD (pgr) [phone]    (c) [phone] 05/04/2014, 1:50 PM

## 2014-05-04 NOTE — Consult Note (Signed)
Reason for Consult:  Left great toe infection/osteo Referring Physician:   [facility]  [name] is an 48 y.o. female.  HPI:   48 yo female diabetic with a history of left foot pain and recent cellulitis.  Had been on oral antibiotics with negative plain films of her left foot.  However, most recently, her pain worsened and she developed more swelling and cellulitis with a wound on her left great toe.  Now x-rays of her left foot are consistent with osteo showing destruction of the distal phalanx.  She has been admitted and started on IV antibiotics.  Ortho is consulted for further evaluation and treatment.  Past Medical History  Diagnosis Date  . Hypertension   . Hypothyroid   . Depression   . 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 Kidney disease   . End stage renal disease     right arm AV graft, post transplant  . Diabetes mellitus     Insulin dependant    Past Surgical History  Procedure Laterality Date  . Back surgery  2001  . Tubal ligation    . Dg av dialysis graft declot or    . Nephrectomy transplanted organ    . Kidney transplant  December 16, 2009  . 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  . Cataract extraction w/ intraocular lens implant Right     Family History  Problem Relation Age of Onset  . Emphysema Mother   . Throat cancer Mother   . COPD Mother   . Cancer Mother   . Emphysema Father   . COPD Father   . Stroke Father   . ADD / ADHD Son     Social History:  reports that she quit smoking about 15 years ago. Her smoking use included Cigarettes. She has a 11 pack-year smoking history. She has never used smokeless tobacco. She reports that she drinks alcohol. She reports that she does not use illicit drugs.  Allergies:  Allergies  Allergen Reactions  . Ace Inhibitors Other (See Comments)    Cough   ( Lisinopril )  . Adhesive [Tape] Itching    Medications: I have reviewed the patient's current medications.  Results for  orders placed during the hospital encounter of 05/03/14 (from the past 48 hour(s))  CBG MONITORING, ED     Status: Abnormal   Collection Time    05/03/14  6:05 PM      Result Value Ref Range   Glucose-Capillary 274 (*) 70 - 99 mg/dL   Comment 1 Documented in Chart     Comment 2 Notify RN    COMPREHENSIVE METABOLIC PANEL     Status: Abnormal   Collection Time    05/03/14  6:08 PM      Result Value Ref Range   Sodium 135 (*) 137 - 147 mEq/L   Potassium 5.1  3.7 - 5.3 mEq/L   Chloride 98  96 - 112 mEq/L   CO2 23  19 - 32 mEq/L   Glucose, Bld 286 (*) 70 - 99 mg/dL   BUN 32 (*) 6 - 23 mg/dL   Creatinine, Ser 1.24 (*) 0.50 - 1.10 mg/dL   Calcium 10.5  8.4 - 10.5 mg/dL   Total Protein 7.9  6.0 - 8.3 g/dL   Albumin 3.7  3.5 - 5.2 g/dL   AST 14  0 - 37 U/L   ALT 16  0 - 35 U/L   Alkaline Phosphatase 112  39 - 117 U/L   Total Bilirubin 0.6  0.3 - 1.2 mg/dL   GFR calc non Af Amer 51 (*) >90 mL/min   GFR calc Af Amer 59 (*) >90 mL/min   Comment: (NOTE)     The eGFR has been calculated using the CKD EPI equation.     This calculation has not been validated in all clinical situations.     eGFR's persistently <90 mL/min signify possible Chronic Kidney     Disease.   Anion gap 14  5 - 15  CBC     Status: None   Collection Time    05/03/14  6:45 PM      Result Value Ref Range   WBC 6.1  4.0 - 10.5 K/uL   RBC 4.85  3.87 - 5.11 MIL/uL   Hemoglobin 14.9  12.0 - 15.0 g/dL   HCT 44.1  36.0 - 46.0 %   MCV 90.9  78.0 - 100.0 fL   MCH 30.7  26.0 - 34.0 pg   MCHC 33.8  30.0 - 36.0 g/dL   RDW 13.2  11.5 - 15.5 %   Platelets 217  150 - 400 K/uL  GLUCOSE, CAPILLARY     Status: Abnormal   Collection Time    05/04/14 12:08 AM      Result Value Ref Range   Glucose-Capillary 226 (*) 70 - 99 mg/dL  COMPREHENSIVE METABOLIC PANEL     Status: Abnormal   Collection Time    05/04/14  5:25 AM      Result Value Ref Range   Sodium 134 (*) 137 - 147 mEq/L   Potassium 4.8  3.7 - 5.3 mEq/L   Chloride  101  96 - 112 mEq/L   CO2 18 (*) 19 - 32 mEq/L   Glucose, Bld 336 (*) 70 - 99 mg/dL   BUN 34 (*) 6 - 23 mg/dL   Creatinine, Ser 1.72 (*) 0.50 - 1.10 mg/dL   Calcium 9.3  8.4 - 10.5 mg/dL   Total Protein 6.2  6.0 - 8.3 g/dL   Albumin 2.8 (*) 3.5 - 5.2 g/dL   AST 13  0 - 37 U/L   ALT 12  0 - 35 U/L   Alkaline Phosphatase 78  39 - 117 U/L   Total Bilirubin 0.6  0.3 - 1.2 mg/dL   GFR calc non Af Amer 34 (*) >90 mL/min   GFR calc Af Amer 40 (*) >90 mL/min   Comment: (NOTE)     The eGFR has been calculated using the CKD EPI equation.     This calculation has not been validated in all clinical situations.     eGFR's persistently <90 mL/min signify possible Chronic Kidney     Disease.   Anion gap 15  5 - 15  CBC WITH DIFFERENTIAL     Status: Abnormal   Collection Time    05/04/14  5:25 AM      Result Value Ref Range   WBC 8.7  4.0 - 10.5 K/uL   RBC 4.53  3.87 - 5.11 MIL/uL   Hemoglobin 13.5  12.0 - 15.0 g/dL   HCT 41.6  36.0 - 46.0 %   MCV 91.8  78.0 - 100.0 fL   MCH 29.8  26.0 - 34.0 pg   MCHC 32.5  30.0 - 36.0 g/dL   RDW 13.6  11.5 - 15.5 %   Platelets 172  150 - 400 K/uL   Neutrophils Relative % 88 (*) 43 -  77 %   Lymphocytes Relative 2 (*) 12 - 46 %   Monocytes Relative 9  3 - 12 %   Eosinophils Relative 1  0 - 5 %   Basophils Relative 0  0 - 1 %   Neutro Abs 7.6  1.7 - 7.7 K/uL   Lymphs Abs 0.2 (*) 0.7 - 4.0 K/uL   Monocytes Absolute 0.8  0.1 - 1.0 K/uL   Eosinophils Absolute 0.1  0.0 - 0.7 K/uL   Basophils Absolute 0.0  0.0 - 0.1 K/uL   Smear Review MORPHOLOGY UNREMARKABLE    LIPASE, BLOOD     Status: Abnormal   Collection Time    05/04/14  5:25 AM      Result Value Ref Range   Lipase 8 (*) 11 - 59 U/L  GLUCOSE, CAPILLARY     Status: Abnormal   Collection Time    05/04/14  7:15 AM      Result Value Ref Range   Glucose-Capillary 381 (*) 70 - 99 mg/dL  SEDIMENTATION RATE     Status: None   Collection Time    05/04/14  7:40 AM      Result Value Ref Range   Sed  Rate 2  0 - 22 mm/hr    Dg Abd 1 View  05/04/2014   CLINICAL DATA:  Abdominal pain.  EXAM: ABDOMEN - 1 VIEW  COMPARISON:  March 23, 2014.  FINDINGS: The bowel gas pattern is normal. No radio-opaque calculi or other significant radiographic abnormality are seen.  IMPRESSION: No evidence of bowel obstruction or ileus.   Electronically Signed   By: Sabino Dick M.D.   On: 05/04/2014 09:28   Dg Toe Great Left  05/03/2014   CLINICAL DATA:  Diabetic wound  EXAM: LEFT GREAT TOE  COMPARISON:  April 20, 2014.  FINDINGS: Frontal, oblique, and lateral views were obtained. There is subtle osteomyelitis along the distal most aspect of the first distal phalanx. No other bony destruction. There is evidence of an old fracture with remodeling involving the second proximal phalanx. There is narrowing of all PIP and DIP joints. There is extensive arterial vascular calcification. No acute fracture or dislocation.  IMPRESSION: Subtle osteomyelitis involving the distal most aspect of the first distal phalanx.   Electronically Signed   By: Lowella Grip M.D.   On: 05/03/2014 18:46    ROS Blood pressure 126/47, pulse 79, temperature 98.2 F (36.8 C), temperature source Oral, resp. rate 18, height '5\' 8"'  (1.727 m), weight 101.5 kg (223 lb 12.3 oz), last menstrual period 11/19/2011, SpO2 95.00%. Physical Exam  Musculoskeletal:       Feet:      Assessment/Plan: Left great toe infection with cellulitis and osteomyelitis  1)  I spoke to her in length and she understands that, given the infection in her bone, an amputation is needed of the left great toe.  She should be continued on IV antibiotics today and we will put her on the OR schedule for tomorrow am 8/29.  Mcarthur Rossetti 05/04/2014, 9:32 AM

## 2014-05-04 NOTE — Progress Notes (Signed)
Hypoglycemic event-CBG 57; 4oz OJ given, will recheck in 15 min

## 2014-05-04 NOTE — Progress Notes (Signed)
Since last CBG pt given dextrose 25g IV, graham crackers and peanut butter, and a sprite.  CBG rechecked=117

## 2014-05-04 NOTE — Progress Notes (Signed)
CBG rechecked=42, oral dextrose to be given

## 2014-05-04 NOTE — Progress Notes (Signed)
Oral glucose and 4oz ice cream given, CBG rechecked=49

## 2014-05-04 NOTE — Progress Notes (Signed)
[facility] PROGRESS NOTE  [name] ZOX:[id number] DOB: August 08, 1966 DOA: 05/03/2014 PCP: [name], MD  Assessment/Plan: 1-left great toe osteomyelitis/left foot cellulitis: -continue IV antibiotics -plan is for left great toe amputation on 8/29 -will follow ortho rec's  2-acute on chronic renal failure (stage 2): patient with hx of renal transplant -start IVF's -follow UA -renal service has been consulted -continue prednisone, cellcept and prograf -will follow rec's  3-metabolic acidosis: due to diarrhea and worsening renal failure -will start sodium bicarb -continue IVF's -treat diarrhea symptomatically (neg c. Diff)  4-uncontrolled type 1 diabetes, with renal manifestations: last A1C 11.5 -continue SSI -will increase 70/30 -continue low carb diet  5-HTN: stable and well controlled. Will continue current medication regimen  6-hypothyroidism: continue synthroid   7-abd pain and diarrhea: normal KUB, neg c. Diff -will use PRN analgesics medications -continue PPI  Code Status: Full Family Communication: no family at bedside Disposition Plan: to be determine; left great toe amp on 8/29 (tentatively)   Consultants: Dr. [name] (orthopedic service)  Procedures:  Left foot x-ray (demonstrating changes consistently with osteomyelitis)   Antibiotics:  Vancomycin   Cefepime    HPI/Subjective: No fever, denies nausea or vomiting. Patient reports some abd discomfort and diarrhea. Complaining of Left foot pain  Objective: Filed Vitals:   05/04/14 1354  BP: 107/68  Pulse: 72  Temp: 97.8 F (36.6 C)  Resp: 18    Intake/Output Summary (Last 24 hours) at 05/04/14 1531 Last data filed at 05/04/14 1400  Gross per 24 hour  Intake 851.25 ml  Output    600 ml  Net 251.25 ml   Filed Weights   05/03/14 2244  Weight: 101.5 kg (223 lb 12.3 oz)    Exam:   General:  Feeling ok, no fever; complaining of diarrhea and abd  discomfort  Cardiovascular: S1 and S2, no rubs or gallops  Respiratory: CTA bilaterally  Abdomen: soft, no guarding; no distension; mild discomfort with deep palpation (mainly mid abd area)  Musculoskeletal: Left foot with mild erythema and open painful wound in her left great toe  Data Reviewed: Basic Metabolic Panel:  Recent Labs Lab 05/03/14 1808 05/04/14 0525  NA 135* 134*  K 5.1 4.8  CL 98 101  CO2 23 18*  GLUCOSE 286* 336*  BUN 32* 34*  CREATININE 1.24* 1.72*  CALCIUM 10.5 9.3   Liver Function Tests:  Recent Labs Lab 05/03/14 1808 05/04/14 0525  AST 14 13  ALT 16 12  ALKPHOS 112 78  BILITOT 0.6 0.6  PROT 7.9 6.2  ALBUMIN 3.7 2.8*    Recent Labs Lab 05/04/14 0525  LIPASE 8*   CBC:  Recent Labs Lab 05/03/14 1845 05/04/14 0525  WBC 6.1 8.7  NEUTROABS  --  7.6  HGB 14.9 13.5  HCT 44.1 41.6  MCV 90.9 91.8  PLT 217 172   CBG:  Recent Labs Lab 05/03/14 1805 05/04/14 0008 05/04/14 0715 05/04/14 1214  GLUCAP 274* 226* 381* 94    Recent Results (from the past 240 hour(s))  CLOSTRIDIUM DIFFICILE BY PCR     Status: None   Collection Time    05/04/14  3:42 AM      Result Value Ref Range Status   C difficile by pcr NEGATIVE  NEGATIVE Final   Comment: Performed at [facility]     Studies: Dg Abd 1 View  05/04/2014   CLINICAL DATA:  Abdominal pain.  EXAM: ABDOMEN - 1 VIEW  COMPARISON:  March 23, 2014.  FINDINGS: The bowel gas pattern is normal. No radio-opaque calculi or other significant radiographic abnormality are seen.  IMPRESSION: No evidence of bowel obstruction or ileus.   Electronically Signed   By: Roque Lias M.D.   On: 05/04/2014 09:28   Dg Toe Great Left  05/03/2014   CLINICAL DATA:  Diabetic wound  EXAM: LEFT GREAT TOE  COMPARISON:  April 20, 2014.  FINDINGS: Frontal, oblique, and lateral views were obtained. There is subtle osteomyelitis along the distal most aspect of the first distal phalanx. No other bony destruction.  There is evidence of an old fracture with remodeling involving the second proximal phalanx. There is narrowing of all PIP and DIP joints. There is extensive arterial vascular calcification. No acute fracture or dislocation.  IMPRESSION: Subtle osteomyelitis involving the distal most aspect of the first distal phalanx.   Electronically Signed   By: Bretta Bang M.D.   On: 05/03/2014 18:46    Scheduled Meds: . amLODipine  5 mg Oral Daily  . carvedilol  50 mg Oral BID  . [START ON 05/05/2014] ceFEPime (MAXIPIME) IV  2 g Intravenous Q24H  . enoxaparin (LOVENOX) injection  50 mg Subcutaneous Q24H  . famotidine  20 mg Oral Daily  . insulin aspart  0-9 Units Subcutaneous TID WC  . insulin aspart protamine- aspart  35 Units Subcutaneous BID WC  . levothyroxine  137 mcg Oral QAC breakfast  . mycophenolate  500 mg Oral BID  . predniSONE  5 mg Oral Daily  . sodium bicarbonate  650 mg Oral BID  . sodium chloride  3 mL Intravenous Q12H  . tacrolimus  1 mg Oral BID  . vancomycin  750 mg Intravenous Q12H   Continuous Infusions: . sodium chloride 75 mL/hr at 05/04/14 0903    Principal Problem:   Osteomyelitis of toe of left foot Active Problems:   HYPERTENSION   DKA, type 1   Hypothyroidism   H/O kidney transplant   Diarrhea   Osteomyelitis    Time spent: >30 minutes    Vassie Loll  [facility] Pager [phone]. If 7PM-7AM, please contact night-coverage at [email or web address], password [email or web address] 05/04/2014, 3:31 PM  LOS: 1 day

## 2014-05-04 NOTE — Progress Notes (Signed)
Inpatient Diabetes Program Recommendations  AACE/ADA: New Consensus Statement on Inpatient Glycemic Control (2013)  Target Ranges:  Prepandial:   less than 140 mg/dL      Peak postprandial:   less than 180 mg/dL (1-2 hours)      Critically ill patients:  140 - 180 mg/dL   Reason for Visit: Hyperglycemia  Diabetes history: DM1 Outpatient Diabetes medications: 70/30 25 units bid Current orders for Inpatient glycemic control: Novolog sensitive tidwc  Needs basal - bolus insulin since pt will be NPO tomorrow am.  Will order OP Diabetes Education consult.    Inpatient Diabetes Program Recommendations Insulin - Basal: D/C 70/30 insulin. Add Lantus 35 units QHS Insulin - Meal Coverage: If taking po's - will need meal coverage insulin - Novolog 6 units tidwc if pt eats >50% meal. Titrate up if CBGs >180 mg/dL [id number]: 91.4% - uncontrolled.  Results for [name], [name] (MRN [id number]) as of 05/04/2014 15:18  Ref. Range 05/03/2014 18:05 05/04/2014 00:08 05/04/2014 07:15 05/04/2014 12:14  Glucose-Capillary Latest Range: 70-99 mg/dL 086 (H) 578 (H) 469 (H) 94  Results for [name], [name] (MRN [id number]) as of 05/04/2014 15:18  Ref. Range 05/04/2014 05:25  Sodium Latest Range: 137-147 mEq/L 134 (L)  Potassium Latest Range: 3.7-5.3 mEq/L 4.8  Chloride Latest Range: 96-112 mEq/L 101  CO2 Latest Range: 19-32 mEq/L 18 (L)  BUN Latest Range: 6-23 mg/dL 34 (H)  Creatinine Latest Range: 0.50-1.10 mg/dL 2.44 (H)  Calcium Latest Range: 8.4-10.5 mg/dL 9.3  GFR calc non Af Amer Latest Range: >90 mL/min 34 (L)  GFR calc Af Amer Latest Range: >90 mL/min 40 (L)  Glucose Latest Range: 70-99 mg/dL 010 (H)  Anion gap Latest Range: 5-15  15   Will continue to follow. Thank you. Ailene Ards, RD, LDN, CDE Inpatient Diabetes Coordinator [phone]

## 2014-05-04 NOTE — Progress Notes (Signed)
ANTIBIOTIC CONSULT NOTE - INITIAL  Pharmacy Consult for Vancomycin and cefepime Indication: Osteomyelitis  Allergies  Allergen Reactions  . Ace Inhibitors Other (See Comments)    Cough   ( Lisinopril )  . Adhesive [Tape] Itching    Patient Measurements: Height:  (172.7 cm) Weight: 223 lb 12.3 oz (101.5 kg) IBW/kg (Calculated) : 63.9 Adjusted Body Weight: 101 kg (04/20/14)  Vital Signs: Temp: 98.2 F (36.8 C) (08/28 0538) Temp src: Oral (08/28 0538) BP: 126/47 mmHg (08/28 0538) Pulse Rate: 79 (08/28 0538) Intake/Output from previous day: 08/27 0701 - 08/28 0700 In: -  Out: 600 [Urine:600] Intake/Output from this shift:    Labs:  Recent Labs  05/03/14 1808 05/03/14 1845 05/04/14 0525  WBC  --  6.1 8.7  HGB  --  14.9 13.5  PLT  --  217 172  CREATININE 1.24*  --  1.72*   Estimated Creatinine Clearance: 50.4 ml/min (by C-G formula based on Cr of 1.72). No results found for this basename: VANCOTROUGH, Leodis Binet, VANCORANDOM, GENTTROUGH, GENTPEAK, GENTRANDOM, TOBRATROUGH, TOBRAPEAK, TOBRARND, AMIKACINPEAK, AMIKACINTROU, AMIKACIN,  in the last 72 hours   Microbiology: Recent Results (from the past 720 hour(s))  MRSA PCR SCREENING     Status: None   Collection Time    04/20/14  5:14 AM      Result Value Ref Range Status   MRSA by PCR NEGATIVE  NEGATIVE Final   Comment:            The GeneXpert MRSA Assay (FDA     approved for NASAL specimens     only), is one component of a     comprehensive MRSA colonization     surveillance program. It is not     intended to diagnose MRSA     infection nor to guide or     monitor treatment for     MRSA infections.    Medical History: Past Medical History  Diagnosis Date  . Hypertension   . Hypothyroid   . Depression   . 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 Kidney disease   . End stage renal disease     right arm AV graft, post transplant  . Diabetes mellitus     Insulin dependant     Assessment: 48yo F with h/o DM, renal failure s/p kidney transplant in 2011, toe amputation. C/o left big toe swelling, pain, redness x 3 days.   8/27 >>Vanco >> 8/27 >> zosyn >>  Tmax: afebrile WBCs: WNL Renal: SCr 1.72 CrCl 5ml/min N  Goal of Therapy:  Vancomycin trough level 15-20 mcg/ml Cefepime per renal function  Plan:   Decrease Vanc to  IV Q12H due to rising SCr  Decrease cefepime to 2 gm q24h due to rising SCr  Follow renal function  Follow duration of therapy    Arley Phenix RPh 05/04/2014, 7:59 AM Pager [phone]

## 2014-05-05 ENCOUNTER — Inpatient Hospital Stay (HOSPITAL_COMMUNITY): Payer: Medicare Other

## 2014-05-05 ENCOUNTER — Encounter (HOSPITAL_COMMUNITY)
Admission: EM | Disposition: A | Discharge status: Home / Self Care | Payer: Self-pay | Source: Home / Self Care | Attending: Internal Medicine

## 2014-05-05 ENCOUNTER — Encounter (HOSPITAL_COMMUNITY): Payer: Medicare Other | Admitting: Anesthesiology

## 2014-05-05 ENCOUNTER — Inpatient Hospital Stay (HOSPITAL_COMMUNITY): Payer: Medicare Other | Admitting: Anesthesiology

## 2014-05-05 DIAGNOSIS — E872 Acidosis, unspecified: Secondary | ICD-10-CM

## 2014-05-05 LAB — BASIC METABOLIC PANEL
ANION GAP: 15 (ref 5–15)
Anion gap: 15 (ref 5–15)
BUN: 35 mg/dL — ABNORMAL HIGH (ref 6–23)
BUN: 32 mg/dL — AB (ref 6–23)
CHLORIDE: 107 meq/L (ref 96–112)
CHLORIDE: 111 meq/L (ref 96–112)
CO2: 19 mEq/L (ref 19–32)
CO2: 16 meq/L — AB (ref 19–32)
Calcium: 9.4 mg/dL (ref 8.4–10.5)
Calcium: 9.4 mg/dL (ref 8.4–10.5)
Creatinine, Ser: 3.18 mg/dL — ABNORMAL HIGH (ref 0.50–1.10)
Creatinine, Ser: 3.27 mg/dL — ABNORMAL HIGH (ref 0.50–1.10)
GFR calc Af Amer: 18 mL/min — ABNORMAL LOW (ref >90)
GFR calc non Af Amer: 16 mL/min — ABNORMAL LOW (ref >90)
GFR calc non Af Amer: 16 mL/min — ABNORMAL LOW (ref >90)
GFR, EST AFRICAN AMERICAN: 19 mL/min — AB (ref >90)
Glucose, Bld: 144 mg/dL — ABNORMAL HIGH (ref 70–99)
Glucose, Bld: 124 mg/dL — ABNORMAL HIGH (ref 70–99)
POTASSIUM: 4.3 meq/L (ref 3.7–5.3)
Potassium: 4.5 mEq/L (ref 3.7–5.3)
SODIUM: 141 meq/L (ref 137–147)
Sodium: 142 mEq/L (ref 137–147)

## 2014-05-05 LAB — GLUCOSE, CAPILLARY
GLUCOSE-CAPILLARY: 104 mg/dL — AB (ref 70–99)
GLUCOSE-CAPILLARY: 50 mg/dL — AB (ref 70–99)
GLUCOSE-CAPILLARY: 131 mg/dL — AB (ref 70–99)
GLUCOSE-CAPILLARY: 159 mg/dL — AB (ref 70–99)
Glucose-Capillary: 147 mg/dL — ABNORMAL HIGH (ref 70–99)
Glucose-Capillary: 43 mg/dL — CL (ref 70–99)
Glucose-Capillary: 47 mg/dL — ABNORMAL LOW (ref 70–99)
Glucose-Capillary: 128 mg/dL — ABNORMAL HIGH (ref 70–99)

## 2014-05-05 LAB — URINALYSIS, ROUTINE W REFLEX MICROSCOPIC
Bilirubin Urine: NEGATIVE
GLUCOSE, UA: NEGATIVE mg/dL
Hgb urine dipstick: NEGATIVE
KETONES UR: NEGATIVE mg/dL
Leukocytes, UA: NEGATIVE
Nitrite: NEGATIVE
PROTEIN: 30 mg/dL — AB
Specific Gravity, Urine: 1.013 (ref 1.005–1.030)
Urobilinogen, UA: 0.2 mg/dL (ref 0.0–1.0)
pH: 5 (ref 5.0–8.0)

## 2014-05-05 LAB — URINE MICROSCOPIC-ADD ON

## 2014-05-05 LAB — CREATININE, URINE, RANDOM: Creatinine, Urine: 118.91 mg/dL

## 2014-05-05 LAB — SODIUM, URINE, RANDOM: Sodium, Ur: 52 mEq/L

## 2014-05-05 SURGERY — AMPUTATION DIGIT
Anesthesia: General | Laterality: Left

## 2014-05-05 MED ORDER — SODIUM CHLORIDE 0.9 % IV SOLN
600.0000 mg | INTRAVENOUS | Status: DC
  Administered 2014-05-05 – 2014-05-11 (×4): 600 mg via INTRAVENOUS
  Filled 2014-05-05 (×5): qty 12

## 2014-05-05 MED ORDER — DEXTROSE 50 % IV SOLN
25.0000 mL | Freq: Once | INTRAVENOUS | Status: AC
  Filled 2014-05-05: qty 50

## 2014-05-05 MED ORDER — ENOXAPARIN SODIUM 30 MG/0.3ML ~~LOC~~ SOLN
30.0000 mg | SUBCUTANEOUS | Status: DC
  Administered 2014-05-05 – 2014-05-10 (×6): 30 mg via SUBCUTANEOUS
  Filled 2014-05-05 (×7): qty 0.3

## 2014-05-05 MED ORDER — SODIUM CHLORIDE 0.9 % IV BOLUS (SEPSIS)
1000.0000 mL | Freq: Once | INTRAVENOUS | Status: AC
  Administered 2014-05-05: 1000 mL via INTRAVENOUS

## 2014-05-05 MED ORDER — INSULIN ASPART PROT & ASPART (70-30 MIX) 100 UNIT/ML ~~LOC~~ SUSP
15.0000 [IU] | Freq: Two times a day (BID) | SUBCUTANEOUS | Status: DC
  Administered 2014-05-05: 15 [IU] via SUBCUTANEOUS

## 2014-05-05 MED ORDER — SODIUM CHLORIDE 0.9 % IV BOLUS (SEPSIS): 1000.0000 mL | Freq: Once | INTRAVENOUS | Status: AC | PRN

## 2014-05-05 MED ORDER — DEXTROSE 50 % IV SOLN
INTRAVENOUS | Status: AC
  Administered 2014-05-05: 25 mL
  Filled 2014-05-05: qty 50

## 2014-05-05 MED ORDER — SODIUM CHLORIDE 0.9 % IV SOLN
600.0000 mg | INTRAVENOUS | Status: DC
  Filled 2014-05-05: qty 12

## 2014-05-05 MED ORDER — INSULIN ASPART PROT & ASPART (70-30 MIX) 100 UNIT/ML ~~LOC~~ SUSP: 15.0000 [IU] | Freq: Two times a day (BID) | SUBCUTANEOUS | Status: DC

## 2014-05-05 NOTE — Anesthesia Preprocedure Evaluation (Deleted)
Anesthesia Evaluation    Airway       Dental   Pulmonary former smoker,          Cardiovascular hypertension,     Neuro/Psych PSYCHIATRIC DISORDERS Depression    GI/Hepatic   Endo/Other  diabetesHypothyroidism   Renal/GU Renal disease     Musculoskeletal   Abdominal   Peds  Hematology   Anesthesia Other Findings S/p kidney transplant  Reproductive/Obstetrics                           Anesthesia Physical Anesthesia Plan  ASA: III  Anesthesia Plan: General LMA   Post-op Pain Management:    Induction:   Airway Management Planned:   Additional Equipment:   Intra-op Plan:   Post-operative Plan:   Informed Consent:   Plan Discussed with:   Anesthesia Plan Comments:         Anesthesia Quick Evaluation

## 2014-05-05 NOTE — Progress Notes (Signed)
Patient ID: [name], female   DOB: [birth year], 48 y.o.   MRN: [id number] We will need to hold off on surgery today due to her worsening rapidly worsening renal function. Will plan on surgery early next week pending any improvement in her medical status.

## 2014-05-05 NOTE — Progress Notes (Signed)
[facility] PROGRESS NOTE  [name] GNF:[id number] DOB: 1966/08/16 DOA: 05/03/2014 PCP: [name], MD  Assessment/Plan: 1-left great toe osteomyelitis/left foot cellulitis: -continue IV antibiotics (daptomycin and cefepime) -plan is for left great toe amputation once patient medical stable -will follow ortho rec's  2-acute on chronic renal failure (stage 2 at baseline): patient with hx of renal transplant -Continue IVF's -follow UA/urine culture; will also check renal US -renal service has been consulted and will follow their recommendations -continue prednisone, cellcept and prograf -Creatinine is worse today (05/04/2014; Cr 3.27) -Follow renal function trend -Vancomycin has been discontinue given renal failure and daptomycin has been initiated.  3-metabolic acidosis: due to diarrhea and worsening renal failure -continue IVF's -treat diarrhea symptomatically (neg c. Diff) -Will follow renal recommendations.  4-uncontrolled type 1 diabetes, with renal manifestations: last A1C 11.5 -continue SSI -continue low carb diet -7030 has been discontinue secondary to persistent episode of hypoglycemia.  5-HTN: stable and well controlled. Will continue current medication regimen  6-hypothyroidism: continue synthroid   7-abd pain and diarrhea: normal KUB, neg c. Diff -will use PRN analgesics medications -continue PPI -Continue as needed Imodium.  Code Status: Full Family Communication: no family at bedside Disposition Plan: to be determine   Consultants: Dr. [name] (orthopedic service) Renal service (Dr. Arta Silence)  Procedures:  Left foot x-ray (demonstrating changes consistently with osteomyelitis)   Antibiotics:  Daptomycin  Cefepime    HPI/Subjective: Feeling ok, no fever; complaining of diarrhea and found to be orthostatic this morning   Objective: Filed Vitals:   05/05/14 1327  BP: 143/83  Pulse: 86  Temp: 97.6 F (36.4 C)  Resp: 18     Intake/Output Summary (Last 24 hours) at 05/05/14 1609 Last data filed at 05/05/14 1328  Gross per 24 hour  Intake   3360 ml  Output    525 ml  Net   2835 ml   Filed Weights   05/03/14 2244  Weight: 101.5 kg (223 lb 12.3 oz)    Exam:   General:  Feeling ok, no fever; complaining of diarrhea and found to be orthostatic this morning   Cardiovascular: S1 and S2, no rubs or gallops  Respiratory: CTA bilaterally  Abdomen: soft, no guarding; no distension; mild discomfort with deep palpation (mainly mid abd area)  Musculoskeletal: Left foot with mild erythema and open painful wound in her left great toe  Data Reviewed: Basic Metabolic Panel:  Recent Labs Lab 05/03/14 1808 05/04/14 0525 05/04/14 1554 05/05/14 0545 05/05/14 1358  NA 135* 134* 137 141 142  K 5.1 4.8 4.3 4.3 4.5  CL 98 101 102 107 111  CO2 23 18* 20 19 16*  GLUCOSE 286* 336* 88 144* 124*  BUN 32* 34* 36* 35* 32*  CREATININE 1.24* 1.72* 2.59* 3.18* 3.27*  CALCIUM 10.5 9.3 9.7 9.4 9.4   Liver Function Tests:  Recent Labs Lab 05/03/14 1808 05/04/14 0525  AST 14 13  ALT 16 12  ALKPHOS 112 78  BILITOT 0.6 0.6  PROT 7.9 6.2  ALBUMIN 3.7 2.8*    Recent Labs Lab 05/04/14 0525  LIPASE 8*   CBC:  Recent Labs Lab 05/03/14 1845 05/04/14 0525  WBC 6.1 8.7  NEUTROABS  --  7.6  HGB 14.9 13.5  HCT 44.1 41.6  MCV 90.9 91.8  PLT 217 172   CBG:  Recent Labs Lab 05/05/14 0720 05/05/14 1214 05/05/14 1241 05/05/14 1302 05/05/14 1342  GLUCAP 147* 43* 47* 50* 131*    Recent Results (from the  past 240 hour(s))  CLOSTRIDIUM DIFFICILE BY PCR     Status: None   Collection Time    05/04/14  3:42 AM      Result Value Ref Range Status   C difficile by pcr NEGATIVE  NEGATIVE Final   Comment: Performed at [facility]  SURGICAL PCR SCREEN     Status: None   Collection Time    05/04/14  6:08 PM      Result Value Ref Range Status   MRSA, PCR NEGATIVE  NEGATIVE Final   Staphylococcus  aureus NEGATIVE  NEGATIVE Final   Comment:            The Xpert SA Assay (FDA     approved for NASAL specimens     in patients over 91 years of age),     is one component of     a comprehensive surveillance     program.  Test performance has     been validated by [employer] for patients greater     than or equal to 66 year old.     It is not intended     to diagnose infection nor to     guide or monitor treatment.     Studies: Dg Abd 1 View  05/04/2014   CLINICAL DATA:  Abdominal pain.  EXAM: ABDOMEN - 1 VIEW  COMPARISON:  March 23, 2014.  FINDINGS: The bowel gas pattern is normal. No radio-opaque calculi or other significant radiographic abnormality are seen.  IMPRESSION: No evidence of bowel obstruction or ileus.   Electronically Signed   By: Roque Lias M.D.   On: 05/04/2014 09:28   US Renal Transplant W/doppler  05/05/2014   CLINICAL DATA:  Acute renal failure. Patient has a transplant kidney in the right lower quadrant.  EXAM: ULTRASOUND OF RENAL TRANSPLANT WITH DOPPLER  TECHNIQUE: Ultrasound examination of the renal transplant was performed with gray-scale, color and duplex doppler evaluation.  COMPARISON:  03/05/2013.  FINDINGS: Transplant kidney location:  Right lower quadrant  Transplant kidney:  Length: 14.0. Normal in size and parenchymal echogenicity. No evidence of mass or hydronephrosis.  Color flow in the main renal artery:  Present  Color flow in the main renal vein:  Present  Duplex Doppler Evaluation  Resistive index in main renal artery: 0.8  Venous waveform in main renal vein:  present/absent  Resistive index in upper pole intrarenal artery: 0.76  Resistive index in lower pole intrarenal artery: 0.77  Bladder: Incompletely evaluated with a Foley balloon catheter in place.  Other findings:  No abnormal perinephric fluid collections.  IMPRESSION: 1. Transplant kidney is sonographically normal in appearance. Specifically, no hydronephrosis and no abnormal perinephric fluid  collections. 2. Resistive indices, as above, similar compared to prior study 03/05/2013.   Electronically Signed   By: Trudie Reed M.D.   On: 05/05/2014 11:56   Dg Toe Great Left  05/03/2014   CLINICAL DATA:  Diabetic wound  EXAM: LEFT GREAT TOE  COMPARISON:  April 20, 2014.  FINDINGS: Frontal, oblique, and lateral views were obtained. There is subtle osteomyelitis along the distal most aspect of the first distal phalanx. No other bony destruction. There is evidence of an old fracture with remodeling involving the second proximal phalanx. There is narrowing of all PIP and DIP joints. There is extensive arterial vascular calcification. No acute fracture or dislocation.  IMPRESSION: Subtle osteomyelitis involving the distal most aspect of the first distal phalanx.   Electronically  Signed   By: Bretta Bang M.D.   On: 05/03/2014 18:46    Scheduled Meds: . ceFEPime (MAXIPIME) IV  2 g Intravenous Q24H  . DAPTOmycin (CUBICIN)  IV  600 mg Intravenous Q48H  . enoxaparin (LOVENOX) injection  30 mg Subcutaneous Q24H  . famotidine  20 mg Oral Daily  . insulin aspart  0-9 Units Subcutaneous TID WC  . levothyroxine  137 mcg Oral QAC breakfast  . mycophenolate  500 mg Oral BID  . pantoprazole  40 mg Oral Q1200  . predniSONE  5 mg Oral Daily  . sodium bicarbonate  650 mg Oral BID  . sodium chloride  3 mL Intravenous Q12H  . tacrolimus  1 mg Oral BID   Continuous Infusions: . sodium chloride 125 mL/hr at 05/05/14 1610    Principal Problem:   Osteomyelitis of toe of left foot Active Problems:   HYPERTENSION   DKA, type 1   Hypothyroidism   H/O kidney transplant   Diarrhea   Osteomyelitis    Time spent: >30 minutes    Vassie Loll  [facility] Pager [phone]. If 7PM-7AM, please contact night-coverage at [email or web address], password [facility] 05/05/2014, 4:09 PM  LOS: 2 days

## 2014-05-05 NOTE — Progress Notes (Signed)
  [facility] Progress Note   Subjective: No new complaints, voiding w/o difficulty. Creat up 3.18  Filed Vitals:   05/04/14 0538 05/04/14 1354 05/04/14 2109 05/05/14 0640  BP: 126/47 107/68 120/59 152/69  Pulse: 79 72 78 86  Temp: 98.2 F (36.8 C) 97.8 F (36.6 C) 97.7 F (36.5 C) 98 F (36.7 C)  TempSrc: Oral Oral Oral Oral  Resp: Height:      Weight:      SpO2: 95% 98% 96% 97%   Exam: Standing BP 81/50, sitting was 126/70 Flat neck veins Chest clear bilat RRR no MRG Abd soft, NTND, RLQ transplant nontender No LE or UE edema Neuro is nf, Ox 3  Date   Creat Apr 2012 1.3- 1.14 Jun 2011 1.45 May 2013 1.25 Jun 2012 1.1- 1.11 Jan 2013 1.0- 1.7 Sept 2014 1.4 > 0.16 Jul 2013 0.8- 1.0  July 2015  1.80 > 1.06  UA pending  Inpatient medications > norvasc (off), coreg (off), maxipime D#2, lovenox, pepcid, insulin, synthroid, Cellcept, protonix, Zosyn 1 dose, prednisone, Na HCO3,  Prograf, IV vancomycin 2 doses , dilaudid, Imodium, zofran  Assessment:  1 AKI / renal transplant - creat up today again 3.18.  Get bladder scan and renal US, place foley, UA, urine lytes, stop vanc for now.  Reassess later today. Bolus IVF's and repeat orthostatics. D/W surgeon, would hold off on surgery for now until vol status and renal function are more stable. 2 L great toe osteomyelitis - on IV abx 3 Renal Tx 2011 on prograf, Cellcept and prednisone, continue  4 DM type 1  Plan- as above    Vinson Moselle MD  pager [phone]    cell [phone]  05/05/2014, 6:55 AM     Recent Labs Lab 05/04/14 0525 05/04/14 1554 05/05/14 0545  NA 134* 137 141  K 4.8 4.3 4.3  CL 101 102 107  CO2 18* 20 19  GLUCOSE 336* 88 144*  BUN 34* 36* 35*  CREATININE 1.72* 2.59* 3.18*  CALCIUM 9.3 9.7 9.4    Recent Labs Lab 05/03/14 1808 05/04/14 0525  AST 14 13  ALT 16 12  ALKPHOS 112 78  BILITOT 0.6 0.6  PROT 7.9 6.2  ALBUMIN 3.7 2.8*    Recent Labs Lab  05/03/14 1845 05/04/14 0525  WBC 6.1 8.7  NEUTROABS  --  7.6  HGB 14.9 13.5  HCT 44.1 41.6  MCV 90.9 91.8  PLT 217 172   . ceFEPime (MAXIPIME) IV  2 g Intravenous Q24H  . enoxaparin (LOVENOX) injection  50 mg Subcutaneous Q24H  . famotidine  20 mg Oral Daily  . insulin aspart  0-9 Units Subcutaneous TID WC  . insulin aspart protamine- aspart  35 Units Subcutaneous BID WC  . levothyroxine  137 mcg Oral QAC breakfast  . mycophenolate  500 mg Oral BID  . pantoprazole  40 mg Oral Q1200  . predniSONE  5 mg Oral Daily  . sodium bicarbonate  650 mg Oral BID  . sodium chloride  3 mL Intravenous Q12H  . tacrolimus  1 mg Oral BID  . vancomycin  750 mg Intravenous Q12H   . sodium chloride 125 mL/hr at 05/05/14 0501   acetaminophen, acetaminophen, dextrose, HYDROmorphone (DILAUDID) injection, loperamide, ondansetron (ZOFRAN) IV, ondansetron

## 2014-05-05 NOTE — Progress Notes (Signed)
Hypoglycemic Event  CBG: 43 @ 1214  Treatment: 15 GM carbohydrate snack  Symptoms: Hungrypt reported "feeling off and I just know my sugar is low"- pt flushed  Follow-up CBG: Time:1243 CBG Result:47- a second 15 gm carb snack given  Possible Reasons for Event: Unknown  Comments/MD notified:Dr. Gwenlyn Perking notified @ 1248. MD aware of most recent CBG -See new orders received in EPIC.     Rometta Emery  Remember to initiate Hypoglycemia Order Set & complete

## 2014-05-05 NOTE — Progress Notes (Signed)
ANTIBIOTIC CONSULT NOTE - INITIAL  Pharmacy Consult for Daptomycin, Cefepime Indication: Osteomyelitis  Allergies  Allergen Reactions  . Ace Inhibitors Other (See Comments)    Cough   ( Lisinopril )  . Adhesive [Tape] Itching    Patient Measurements: Height:  (172.7 cm) Weight: 223 lb 12.3 oz (101.5 kg) IBW/kg (Calculated) : 63.9   Vital Signs: Temp: 98.3 F (36.8 C) (08/29 0731) Temp src: Oral (08/29 0731) BP: 126/59 mmHg (08/29 0731) Pulse Rate: 78 (08/29 0731) Intake/Output from previous day: 08/28 0701 - 08/29 0700 In: 1641.3 [P.O.:720; I.V.:771.3; IV Piggyback:150] Out: 275 [Urine:275]  Labs:  Recent Labs  05/03/14 1845 05/04/14 0525 05/04/14 1554 05/05/14 0545  WBC 6.1 8.7  --   --   HGB 14.9 13.5  --   --   PLT 217 172  --   --   CREATININE  --  1.72* 2.59* 3.18*   Estimated Creatinine Clearance: 27.2 ml/min (by C-G formula based on Cr of 3.18). No results found for this basename: VANCOTROUGH, Leodis Binet, VANCORANDOM, GENTTROUGH, GENTPEAK, GENTRANDOM, TOBRATROUGH, TOBRAPEAK, TOBRARND, AMIKACINPEAK, AMIKACINTROU, AMIKACIN,  in the last 72 hours    Anti-infectives:  8/27 >> zosyn x1 8/27 >>Vanco >> 8/29 8/27 >> Cefepime >> 8/29 >> Dapto >>   Assessment: 48yo F admitted 8/27 with h/o DM, renal failure s/p kidney transplant in 2011, toe amputation. C/o left big toe swelling, pain, redness x 3 days.  Pharmacy was initially consulted to dose Vancomycin and Cefepime, but Vancomycin changed to Daptomycin for rising SCr on 8/29.  Tmax: afebrile WBCs: WNL Renal: SCr trending up (1.72 > 2.59 > 3.18) with CrCl ~ 24 ml/min Normalized  Goal of Therapy:  Appropriate abx dosing, eradication of infection.   Plan:   Daptomycin 600 (6 mg/kg) IV q48h  Cefepime 2g IV q24h  Follow up renal function and cultures as available.   Lynann Beaver PharmD, BCPS Pager [phone] 05/05/2014 8:37 AM

## 2014-05-06 DIAGNOSIS — R197 Diarrhea, unspecified: Secondary | ICD-10-CM

## 2014-05-06 DIAGNOSIS — E871 Hypo-osmolality and hyponatremia: Secondary | ICD-10-CM

## 2014-05-06 LAB — BASIC METABOLIC PANEL
Anion gap: 14 (ref 5–15)
BUN: 28 mg/dL — ABNORMAL HIGH (ref 6–23)
CALCIUM: 9.2 mg/dL (ref 8.4–10.5)
CO2: 14 mEq/L — ABNORMAL LOW (ref 19–32)
Chloride: 108 mEq/L (ref 96–112)
Creatinine, Ser: 3.47 mg/dL — ABNORMAL HIGH (ref 0.50–1.10)
GFR calc Af Amer: 17 mL/min — ABNORMAL LOW (ref >90)
GFR, EST NON AFRICAN AMERICAN: 15 mL/min — AB (ref >90)
GLUCOSE: 200 mg/dL — AB (ref 70–99)
Potassium: 4.5 mEq/L (ref 3.7–5.3)
SODIUM: 136 meq/L — AB (ref 137–147)

## 2014-05-06 LAB — GLUCOSE, CAPILLARY
GLUCOSE-CAPILLARY: 224 mg/dL — AB (ref 70–99)
GLUCOSE-CAPILLARY: 238 mg/dL — AB (ref 70–99)
Glucose-Capillary: 178 mg/dL — ABNORMAL HIGH (ref 70–99)
Glucose-Capillary: 270 mg/dL — ABNORMAL HIGH (ref 70–99)

## 2014-05-06 MED ORDER — DEXTROSE 5 % IV SOLN
1.0000 g | INTRAVENOUS | Status: DC
  Administered 2014-05-07 – 2014-05-12 (×6): 1 g via INTRAVENOUS
  Filled 2014-05-06 (×6): qty 1

## 2014-05-06 MED ORDER — SODIUM BICARBONATE 650 MG PO TABS
650.0000 mg | ORAL_TABLET | Freq: Three times a day (TID) | ORAL | Status: DC
  Administered 2014-05-06 (×2): 650 mg via ORAL
  Filled 2014-05-06 (×5): qty 1

## 2014-05-06 NOTE — Progress Notes (Signed)
  [facility] Progress Note   Subjective: Creat up 3.1 > 3.4 today   Filed Vitals:   05/05/14 1000 05/05/14 1327 05/05/14 2144 05/06/14 0512  BP: 102/57 143/83 152/75 163/81  Pulse: 83 86 81 88  Temp:  97.6 F (36.4 C) 98.5 F (36.9 C) 98 F (36.7 C)  TempSrc:  Oral Oral Oral  Resp:  Height:      Weight:    100.744 kg (222 lb 1.6 oz)  SpO2:  94% 97% 92%   Exam: BP up 160/81 Flat neck veins Chest clear bilat RRR no MRG Abd soft, NTND, RLQ transplant nontender No LE or UE edema Neuro is nf, Ox 3  Date   Creat Apr 2012 1.3- 1.14 Jun 2011 1.45 May 2013 1.25 Jun 2012 1.1- 1.11 Jan 2013 1.0- 1.7 Sept 2014 1.4 > 0.16 Jul 2013 0.8- 1.0  July 2015  1.80 > 1.06  UA 8/29 0-2 wbc/rbc, many bact, few epis UNa 52, UCreat 119, FeNa 0.99%  Inpatient medications > norvasc (off), coreg (off), maxipime D#3, lovenox, pepcid, insulin, synthroid, Cellcept, protonix, Zosyn 1 dose, prednisone, Na HCO3,  Prograf, IV vancomycin 2 doses , dilaudid, Imodium, zofran  Assessment:  1 AKI / renal transplant - creat up today but not as much, UOP improved with IVF's, BP's better. FeNa 1%, UA normal. I spoke with transplant MD at [facility], suggested likely this is AKI from ATN, check for FK toxicity (get tacrolimus trough levels daily), check plasma BK and CMV pcr and viral panel on stool. If creat not improving in 24-48 hrs they will prob recommend transfer for kidney biopsy.  2 L great toe osteomyelitis - on IV abx 3 Renal Tx 2011 on prograf, Cellcept and prednisone 4 DM type 1  Plan- as above    Vinson Moselle MD  pager [phone]    cell [phone]  05/06/2014, 8:35 AM     Recent Labs Lab 05/05/14 0545 05/05/14 1358 05/06/14 0735  NA 141 142 136*  K 4.3 4.5 4.5  CL 107 111 108  CO2 19 16* 14*  GLUCOSE 144* 124* 200*  BUN 35* 32* 28*  CREATININE 3.18* 3.27* 3.47*  CALCIUM 9.4 9.4 9.2    Recent Labs Lab 05/03/14 1808 05/04/14 0525  AST 14 13  ALT 16 12   ALKPHOS 112 78  BILITOT 0.6 0.6  PROT 7.9 6.2  ALBUMIN 3.7 2.8*    Recent Labs Lab 05/03/14 1845 05/04/14 0525  WBC 6.1 8.7  NEUTROABS  --  7.6  HGB 14.9 13.5  HCT 44.1 41.6  MCV 90.9 91.8  PLT 217 172   . ceFEPime (MAXIPIME) IV  2 g Intravenous Q24H  . DAPTOmycin (CUBICIN)  IV  600 mg Intravenous Q48H  . enoxaparin (LOVENOX) injection  30 mg Subcutaneous Q24H  . famotidine  20 mg Oral Daily  . insulin aspart  0-9 Units Subcutaneous TID WC  . levothyroxine  137 mcg Oral QAC breakfast  . mycophenolate  500 mg Oral BID  . pantoprazole  40 mg Oral Q1200  . predniSONE  5 mg Oral Daily  . sodium bicarbonate  650 mg Oral BID  . sodium chloride  3 mL Intravenous Q12H  . tacrolimus  1 mg Oral BID   . sodium chloride 125 mL/hr at 05/05/14 2323   acetaminophen, acetaminophen, dextrose, HYDROmorphone (DILAUDID) injection, loperamide, ondansetron (ZOFRAN) IV, ondansetron

## 2014-05-06 NOTE — Progress Notes (Signed)
[facility] PROGRESS NOTE  [name] MANGEN [name]:[id number] DOB: 1966/03/03 DOA: 05/03/2014 PCP: [name], MD  Assessment/Plan: 1-left great toe osteomyelitis/left foot cellulitis: -continue IV antibiotics (daptomycin and cefepime) -plan is for left great toe amputation once patient medical stable -will follow ortho rec's  2-acute on chronic renal failure (stage 2 at baseline): patient with hx of renal transplant -Continue IVF's -follow UA/urine culture; will also check renal US -renal service has been consulted and will follow their recommendations -continue prednisone, cellcept and prograf -Creatinine is worse today (05/06/2014; Cr 3.4) -Follow renal function trend -Vancomycin has been discontinue given renal failure and daptomycin has been initiated.  3-metabolic acidosis: due to diarrhea and worsening renal failure -continue IVF's -Continue symptomatic treatment of her diarrhea (neg c. Diff) -Will follow renal recommendations.  4-uncontrolled type 1 diabetes, with renal manifestations: last A1C 11.5 -continue SSI -continue low carb diet - No further episode of hypoglycemia; will continue holding 70/30 for now.  5-HTN: stable and well controlled. Will continue current medication regimen  6-hypothyroidism: continue synthroid   7-abd pain and diarrhea: normal KUB, neg c. Diff -will use PRN analgesics medications -continue PPI -Continue as needed Imodium. -will check CMV in her stools as recommended by renal service  Code Status: Full Family Communication: no family at bedside Disposition Plan: to be determine   Consultants: Dr. [name] (orthopedic service) Renal service (Dr. Arta Silence)  Procedures:  Left foot x-ray (demonstrating changes consistently with osteomyelitis)   Antibiotics:  Daptomycin  Cefepime    HPI/Subjective: Feeling ok overall, no fever; still with diarrhea. Improved volume status and BP with IVF's  Objective: Filed Vitals:    05/06/14 1426  BP: 171/78  Pulse: 88  Temp:   Resp:     Intake/Output Summary (Last 24 hours) at 05/06/14 1515 Last data filed at 05/06/14 1400  Gross per 24 hour  Intake    600 ml  Output   1800 ml  Net  -1200 ml   Filed Weights   05/03/14 2244 05/06/14 0512  Weight: 101.5 kg (223 lb 12.3 oz) 100.744 kg (222 lb 1.6 oz)    Exam:   General:  Feeling ok overall, no fever; still with diarrhea. Improved volume status and BP with IVF's  Cardiovascular: S1 and S2, no rubs or gallops  Respiratory: CTA bilaterally  Abdomen: soft, no guarding; no distension; mild discomfort with deep palpation (mainly mid abd area)  Musculoskeletal: Left foot with mild erythema and open painful wound in her left great toe  Data Reviewed: Basic Metabolic Panel:  Recent Labs Lab 05/04/14 0525 05/04/14 1554 05/05/14 0545 05/05/14 1358 05/06/14 0735  NA 134* 137 141 142 136*  K 4.8 4.3 4.3 4.5 4.5  CL 101 102 107 111 108  CO2 18* 20 19 16* 14*  GLUCOSE 336* 88 144* 124* 200*  BUN 34* 36* 35* 32* 28*  CREATININE 1.72* 2.59* 3.18* 3.27* 3.47*  CALCIUM 9.3 9.7 9.4 9.4 9.2   Liver Function Tests:  Recent Labs Lab 05/03/14 1808 05/04/14 0525  AST 14 13  ALT 16 12  ALKPHOS 112 78  BILITOT 0.6 0.6  PROT 7.9 6.2  ALBUMIN 3.7 2.8*    Recent Labs Lab 05/04/14 0525  LIPASE 8*   CBC:  Recent Labs Lab 05/03/14 1845 05/04/14 0525  WBC 6.1 8.7  NEUTROABS  --  7.6  HGB 14.9 13.5  HCT 44.1 41.6  MCV 90.9 91.8  PLT 217 172   CBG:  Recent Labs Lab 05/05/14 1342 05/05/14 1712  05/05/14 2203 05/06/14 0738 05/06/14 1156  GLUCAP 131* 159* 128* 178* 224*    Recent Results (from the past 240 hour(s))  CLOSTRIDIUM DIFFICILE BY PCR     Status: None   Collection Time    05/04/14  3:42 AM      Result Value Ref Range Status   C difficile by pcr NEGATIVE  NEGATIVE Final   Comment: Performed at [facility]  SURGICAL PCR SCREEN     Status: None   Collection Time     05/04/14  6:08 PM      Result Value Ref Range Status   MRSA, PCR NEGATIVE  NEGATIVE Final   Staphylococcus aureus NEGATIVE  NEGATIVE Final   Comment:            The Xpert SA Assay (FDA     approved for NASAL specimens     in patients over 14 years of age),     is one component of     a comprehensive surveillance     program.  Test performance has     been validated by [employer] for patients greater     than or equal to 42 year old.     It is not intended     to diagnose infection nor to     guide or monitor treatment.     Studies: US Renal Transplant W/doppler  05/05/2014   CLINICAL DATA:  Acute renal failure. Patient has a transplant kidney in the right lower quadrant.  EXAM: ULTRASOUND OF RENAL TRANSPLANT WITH DOPPLER  TECHNIQUE: Ultrasound examination of the renal transplant was performed with gray-scale, color and duplex doppler evaluation.  COMPARISON:  03/05/2013.  FINDINGS: Transplant kidney location:  Right lower quadrant  Transplant kidney:  Length: 14.0. Normal in size and parenchymal echogenicity. No evidence of mass or hydronephrosis.  Color flow in the main renal artery:  Present  Color flow in the main renal vein:  Present  Duplex Doppler Evaluation  Resistive index in main renal artery: 0.8  Venous waveform in main renal vein:  present/absent  Resistive index in upper pole intrarenal artery: 0.76  Resistive index in lower pole intrarenal artery: 0.77  Bladder: Incompletely evaluated with a Foley balloon catheter in place.  Other findings:  No abnormal perinephric fluid collections.  IMPRESSION: 1. Transplant kidney is sonographically normal in appearance. Specifically, no hydronephrosis and no abnormal perinephric fluid collections. 2. Resistive indices, as above, similar compared to prior study 03/05/2013.   Electronically Signed   By: Trudie Reed M.D.   On: 05/05/2014 11:56    Scheduled Meds: . [START ON 05/07/2014] ceFEPime (MAXIPIME) IV  1 g Intravenous Q24H  .  DAPTOmycin (CUBICIN)  IV  600 mg Intravenous Q48H  . enoxaparin (LOVENOX) injection  30 mg Subcutaneous Q24H  . famotidine  20 mg Oral Daily  . insulin aspart  0-9 Units Subcutaneous TID WC  . levothyroxine  137 mcg Oral QAC breakfast  . mycophenolate  500 mg Oral BID  . pantoprazole  40 mg Oral Q1200  . predniSONE  5 mg Oral Daily  . sodium bicarbonate  650 mg Oral BID  . sodium chloride  3 mL Intravenous Q12H  . tacrolimus  1 mg Oral BID   Continuous Infusions: . sodium chloride 125 mL/hr at 05/05/14 2323    Principal Problem:   Osteomyelitis of toe of left foot Active Problems:   HYPERTENSION   DKA, type 1   Hypothyroidism  H/O kidney transplant   Diarrhea   Osteomyelitis    Time spent: >30 minutes    Vassie Loll  [facility] Pager [phone]. If 7PM-7AM, please contact night-coverage at [email or web address], password [facility] 05/06/2014, 3:15 PM  LOS: 3 days

## 2014-05-06 NOTE — Progress Notes (Signed)
ANTIBIOTIC CONSULT NOTE - Follow Up  Pharmacy Consult for Daptomycin, Cefepime Indication: Osteomyelitis  Allergies  Allergen Reactions  . Ace Inhibitors Other (See Comments)    Cough   ( Lisinopril )  . Adhesive [Tape] Itching    Patient Measurements: Height:  (172.7 cm) Weight: 222 lb 1.6 oz (100.744 kg) IBW/kg (Calculated) : 63.9   Vital Signs: Temp: 98 F (36.7 C) (08/30 0512) Temp src: Oral (08/30 0512) BP: 163/81 mmHg (08/30 0512) Pulse Rate: 88 (08/30 0512) Intake/Output from previous day: 08/29 0701 - 08/30 0700 In: 2720 [P.O.:720; I.V.:2000] Out: 1050 [Urine:1050]  Labs:  Recent Labs  05/03/14 1845 05/04/14 0525  05/05/14 0545 05/05/14 0919 05/05/14 1358 05/06/14 0735  WBC 6.1 8.7  --   --   --   --   --   HGB 14.9 13.5  --   --   --   --   --   PLT 217 172  --   --   --   --   --   LABCREA  --   --   --   --  118.91  --   --   CREATININE  --  1.72*  < > 3.18*  --  3.27* 3.47*  < > = values in this interval not displayed. Estimated Creatinine Clearance: 24.9 ml/min (by C-G formula based on Cr of 3.47). No results found for this basename: Rolm Gala, VANCORANDOM, GENTTROUGH, GENTPEAK, GENTRANDOM, TOBRATROUGH, TOBRAPEAK, TOBRARND, AMIKACINPEAK, AMIKACINTROU, AMIKACIN,  in the last 72 hours    Assessment: 48yo F admitted 8/27 with h/o DM, renal failure s/p kidney transplant in 2011, toe amputation. C/o left big toe swelling, pain, redness x 3 days.  Pharmacy was initially consulted to dose Vancomycin and Cefepime, but Vancomycin changed to Daptomycin for rising SCr on 8/29.  8/27 >> zosyn x1 8/27 >>Vanco >> 8/29 8/27 >> Cefepime >> 8/29 >> Dapto >>  Tmax: afebrile WBCs: WNL Renal: SCr cont to increase, CrCl ~ 22N, ~ 31CG, UOP 0.4 ml/kg/hr yest (improved)  8/28 MRSA PCR: negative 8/28 Cdiff PCR: negative  Today is day #3 Cefepime 2g q24h and day #2 Dapto  q48h (~ 5.9 mg/kg) for OM.  Great left toe amputation delayed d/t volume  status and renal fxn.  Goal of Therapy:  Appropriate abx dosing, eradication of infection.   Plan:   Adjust Cefepime dose to 1g IV q24h for worsening SCr.  Continue Daptomycin 600 (6 mg/kg) IV q48h  Follow up renal function and cultures as available.   Clance Boll, PharmD, BCPS Pager: [phone] 05/06/2014 12:54 PM

## 2014-05-07 DIAGNOSIS — R7309 Other abnormal glucose: Secondary | ICD-10-CM

## 2014-05-07 LAB — GI PATHOGEN PANEL BY PCR, STOOL
C difficile toxin A/B: NEGATIVE
Campylobacter by PCR: NEGATIVE
Cryptosporidium by PCR: NEGATIVE
E COLI (ETEC) LT/ST: NEGATIVE
E COLI (STEC): NEGATIVE
E coli 0157 by PCR: NEGATIVE
G lamblia by PCR: NEGATIVE
NOROVIRUS G1/G2: NEGATIVE
Rotavirus A by PCR: NEGATIVE
Salmonella by PCR: NEGATIVE
Shigella by PCR: NEGATIVE

## 2014-05-07 LAB — GLUCOSE, CAPILLARY
GLUCOSE-CAPILLARY: 427 mg/dL — AB (ref 70–99)
GLUCOSE-CAPILLARY: 277 mg/dL — AB (ref 70–99)
Glucose-Capillary: 301 mg/dL — ABNORMAL HIGH (ref 70–99)
Glucose-Capillary: 253 mg/dL — ABNORMAL HIGH (ref 70–99)

## 2014-05-07 LAB — BASIC METABOLIC PANEL
Anion gap: 16 — ABNORMAL HIGH (ref 5–15)
BUN: 29 mg/dL — AB (ref 6–23)
CHLORIDE: 106 meq/L (ref 96–112)
CO2: 14 mEq/L — ABNORMAL LOW (ref 19–32)
Calcium: 9.3 mg/dL (ref 8.4–10.5)
Creatinine, Ser: 3.47 mg/dL — ABNORMAL HIGH (ref 0.50–1.10)
GFR calc Af Amer: 17 mL/min — ABNORMAL LOW (ref >90)
GFR calc non Af Amer: 15 mL/min — ABNORMAL LOW (ref >90)
Glucose, Bld: 396 mg/dL — ABNORMAL HIGH (ref 70–99)
POTASSIUM: 4.5 meq/L (ref 3.7–5.3)
Sodium: 136 mEq/L — ABNORMAL LOW (ref 137–147)

## 2014-05-07 MED ORDER — INSULIN DETEMIR 100 UNIT/ML ~~LOC~~ SOLN
10.0000 [IU] | Freq: Two times a day (BID) | SUBCUTANEOUS | Status: DC
  Administered 2014-05-07: 10 [IU] via SUBCUTANEOUS
  Filled 2014-05-07 (×2): qty 0.1

## 2014-05-07 MED ORDER — SODIUM BICARBONATE 650 MG PO TABS
650.0000 mg | ORAL_TABLET | Freq: Four times a day (QID) | ORAL | Status: DC
  Administered 2014-05-07: 650 mg via ORAL
  Filled 2014-05-07 (×4): qty 1

## 2014-05-07 MED ORDER — INSULIN DETEMIR 100 UNIT/ML ~~LOC~~ SOLN
15.0000 [IU] | Freq: Two times a day (BID) | SUBCUTANEOUS | Status: DC
  Administered 2014-05-07: 15 [IU] via SUBCUTANEOUS
  Filled 2014-05-07 (×2): qty 0.15

## 2014-05-07 MED ORDER — SODIUM BICARBONATE 650 MG PO TABS
1300.0000 mg | ORAL_TABLET | Freq: Two times a day (BID) | ORAL | Status: DC
  Administered 2014-05-07 – 2014-05-12 (×9): 1300 mg via ORAL
  Filled 2014-05-07 (×11): qty 2

## 2014-05-07 MED ORDER — INSULIN ASPART 100 UNIT/ML ~~LOC~~ SOLN
10.0000 [IU] | Freq: Once | SUBCUTANEOUS | Status: AC
  Administered 2014-05-07: 10 [IU] via SUBCUTANEOUS

## 2014-05-07 NOTE — Progress Notes (Signed)
Subjective: Interval History: has no complaint, feels better. .  Objective: Vital signs in last 24 hours: Temp:  [97.5 F (36.4 C)-98.3 F (36.8 C)] 97.5 F (36.4 C) (08/31 0549) Pulse Rate:  [80-94] 80 (08/31 0549) Resp:  [16] 16 (08/31 0549) BP: (156-172)/(78-98) 167/90 mmHg (08/31 0549) SpO2:  [95 %-98 %] 98 % (08/31 0549) Weight:  [100.109 kg (220 lb 11.2 oz)] 100.109 kg (220 lb 11.2 oz) (08/31 0718) Weight change:   Intake/Output from previous day: 08/30 0701 - 08/31 0700 In: 6100 [P.O.:600; I.V.:5500] Out: 2350 [Urine:2350] Intake/Output this shift: Total I/O In: -  Out: 900 [Urine:900]  General appearance: alert, cooperative and moderately obese Resp: diminished breath sounds bilaterally Cardio: S1, S2 normal and systolic murmur: holosystolic 2/6, blowing at apex GI: soft,pos bs, liver down 4 cm, Renal TX RLQ normal size and nontender Extremities: L foot bandage  Lab Results: No results found for this basename: WBC, HGB, HCT, PLT,  in the last 72 hours BMET:  Recent Labs  05/06/14 0735 05/07/14 0444  NA 136* 136*  K 4.5 4.5  CL 108 106  CO2 14* 14*  GLUCOSE 200* 396*  BUN 28* 29*  CREATININE 3.47* 3.47*  CALCIUM 9.2 9.3   No results found for this basename: PTH,  in the last 72 hours Iron Studies: No results found for this basename: IRON, TIBC, TRANSFERRIN, FERRITIN,  in the last 72 hours  Studies/Results: No results found.  I have reviewed the patient's current medications.  Assessment/Plan: 1 Renal TX with AKI  Plateau. Still acidemic, cont po bicarb.  K ok.  Vol replete, cut back ivf 2 N,V,D improving, 3 DM per primary, needs better control 4 PVD 5 Osteo on AB P reduce ivf, cont AB, f/u Prograf, CMV, BK    LOS: 4 days   Marveline Profeta L 05/07/2014,12:05 PM

## 2014-05-07 NOTE — Progress Notes (Signed)
[facility] PROGRESS NOTE  [name] GMW:[id number] DOB: Apr 08, 1966 DOA: 05/03/2014 PCP: [name], MD  Assessment/Plan: 1-left great toe osteomyelitis/left foot cellulitis: -continue IV antibiotics (daptomycin and cefepime) -plan is for left great toe amputation once patient medical stable -will follow ortho rec's  2-acute on chronic renal failure (stage 2 at baseline): patient with hx of renal transplant -Continue IVF's; but will change to 75cc/hr (volume and BP now improved, but will require some extra to help with elevated CBG's) -renal service has been consulted and will follow their recommendations -continue prednisone, cellcept and prograf -Creatinine is plateau at 3.4; expecting to start improving now. -Follow renal function trend -Vancomycin has been discontinue given renal failure and daptomycin has been initiated.  3-metabolic acidosis: due to diarrhea and worsening renal failure -continue IVF's -Continue symptomatic treatment of her diarrhea (patient stools are neg for c. Diff) -Will continue bicarbonate tablets  4-uncontrolled type 1 diabetes, with renal manifestations: last A1C 11.5 -continue SSI -continue low carb diet -No further episode of hypoglycemia; and now developing elevated CBGs -Will add Levemir 15 units twice a day and follow CBGs response  5-HTN: stable and and with fair control since IVF's resuscitation. Will continue current medication regimen and adjust as needed if blood pressure continued to be elevated.  6-hypothyroidism: continue synthroid   7-abd pain and diarrhea: normal KUB, neg c. Diff -will continue PRN analgesics medications -continue PPI -Continue as needed Imodium. -will follow results of CMV in her stools as recommended by renal service  Code Status: Full Family Communication: no family at bedside Disposition Plan: to be determine   Consultants: Dr. [name] (orthopedic service) Renal service (Dr.  Arta Silence)  Procedures:  Left foot x-ray (demonstrating changes consistently with osteomyelitis)   Antibiotics:  Daptomycin  Cefepime    HPI/Subjective: Feeling ok overall, no fever; still with diarrhea, but improved. Hypovolemia has now resolved and BP is a stable/slightly elevated after IVF's. Patient with high elevated CBGs.  Objective: Filed Vitals:   05/07/14 1307  BP: 170/98  Pulse: 78  Temp: 99.4 F (37.4 C)  Resp: 16    Intake/Output Summary (Last 24 hours) at 05/07/14 1743 Last data filed at 05/07/14 1400  Gross per 24 hour  Intake   6355 ml  Output   1950 ml  Net   4405 ml   Filed Weights   05/03/14 2244 05/06/14 0512 05/07/14 0718  Weight: 101.5 kg (223 lb 12.3 oz) 100.744 kg (222 lb 1.6 oz) 100.109 kg (220 lb 11.2 oz)    Exam:   General:  Feeling ok overall, no fever; still with diarrhea, but improved. Hypovolemia has now resolved and BP is a stable/slightly elevated after IVF's  Cardiovascular: S1 and S2, no rubs or gallops  Respiratory: CTA bilaterally  Abdomen: soft, no guarding; no distension; mild discomfort with deep palpation (mainly mid abd area)  Musculoskeletal: Left foot without erythema now; but with painful wound in her left great toe (black scar on the tip of her toe is for min)  Data Reviewed: Basic Metabolic Panel:  Recent Labs Lab 05/04/14 1554 05/05/14 0545 05/05/14 1358 05/06/14 0735 05/07/14 0444  NA 137 141 142 136* 136*  K 4.3 4.3 4.5 4.5 4.5  CL 102 107 111 108 106  CO2 20 19 16* 14* 14*  GLUCOSE 88 144* 124* 200* 396*  BUN 36* 35* 32* 28* 29*  CREATININE 2.59* 3.18* 3.27* 3.47* 3.47*  CALCIUM 9.7 9.4 9.4 9.2 9.3   Liver Function Tests:  Recent Labs  Lab 05/03/14 1808 05/04/14 0525  AST 14 13  ALT 16 12  ALKPHOS 112 78  BILITOT 0.6 0.6  PROT 7.9 6.2  ALBUMIN 3.7 2.8*    Recent Labs Lab 05/04/14 0525  LIPASE 8*   CBC:  Recent Labs Lab 05/03/14 1845 05/04/14 0525  WBC 6.1 8.7  NEUTROABS  --   7.6  HGB 14.9 13.5  HCT 44.1 41.6  MCV 90.9 91.8  PLT 217 172   CBG:  Recent Labs Lab 05/06/14 1657 05/06/14 2158 05/07/14 0714 05/07/14 1126 05/07/14 1718  GLUCAP 270* 238* 427* 301* 277*    Recent Results (from the past 240 hour(s))  CLOSTRIDIUM DIFFICILE BY PCR     Status: None   Collection Time    05/04/14  3:42 AM      Result Value Ref Range Status   C difficile by pcr NEGATIVE  NEGATIVE Final   Comment: Performed at [facility]  SURGICAL PCR SCREEN     Status: None   Collection Time    05/04/14  6:08 PM      Result Value Ref Range Status   MRSA, PCR NEGATIVE  NEGATIVE Final   Staphylococcus aureus NEGATIVE  NEGATIVE Final   Comment:            The Xpert SA Assay (FDA     approved for NASAL specimens     in patients over 25 years of age),     is one component of     a comprehensive surveillance     program.  Test performance has     been validated by [employer] for patients greater     than or equal to 63 year old.     It is not intended     to diagnose infection nor to     guide or monitor treatment.     Studies: No results found.  Scheduled Meds: . ceFEPime (MAXIPIME) IV  1 g Intravenous Q24H  . DAPTOmycin (CUBICIN)  IV  600 mg Intravenous Q48H  . enoxaparin (LOVENOX) injection  30 mg Subcutaneous Q24H  . famotidine  20 mg Oral Daily  . insulin aspart  0-9 Units Subcutaneous TID WC  . insulin detemir  15 Units Subcutaneous BID  . levothyroxine  137 mcg Oral QAC breakfast  . mycophenolate  500 mg Oral BID  . pantoprazole  40 mg Oral Q1200  . predniSONE  5 mg Oral Daily  . sodium bicarbonate  1,300 mg Oral BID  . sodium chloride  3 mL Intravenous Q12H  . tacrolimus  1 mg Oral BID   Continuous Infusions: . sodium chloride 50 mL/hr at 05/07/14 1655    Principal Problem:   Osteomyelitis of toe of left foot Active Problems:   HYPERTENSION   DKA, type 1   Hypothyroidism   H/O kidney transplant   Diarrhea    Osteomyelitis    Time spent: >30 minutes    Vassie Loll  [facility] Pager [phone]. If 7PM-7AM, please contact night-coverage at [email or web address], password [facility] 05/07/2014, 5:43 PM  LOS: 4 days

## 2014-05-08 LAB — RENAL FUNCTION PANEL
Albumin: 2.7 g/dL — ABNORMAL LOW (ref 3.5–5.2)
Anion gap: 13 (ref 5–15)
BUN: 29 mg/dL — AB (ref 6–23)
CHLORIDE: 106 meq/L (ref 96–112)
CO2: 18 meq/L — AB (ref 19–32)
CREATININE: 3.24 mg/dL — AB (ref 0.50–1.10)
Calcium: 9.3 mg/dL (ref 8.4–10.5)
GFR calc Af Amer: 18 mL/min — ABNORMAL LOW (ref >90)
GFR calc non Af Amer: 16 mL/min — ABNORMAL LOW (ref >90)
Glucose, Bld: 265 mg/dL — ABNORMAL HIGH (ref 70–99)
Phosphorus: 3.2 mg/dL (ref 2.3–4.6)
Potassium: 4.5 mEq/L (ref 3.7–5.3)
Sodium: 137 mEq/L (ref 137–147)

## 2014-05-08 LAB — CBC
HEMATOCRIT: 38.8 % (ref 36.0–46.0)
Hemoglobin: 12.5 g/dL (ref 12.0–15.0)
MCH: 29.4 pg (ref 26.0–34.0)
MCHC: 32.2 g/dL (ref 30.0–36.0)
MCV: 91.3 fL (ref 78.0–100.0)
Platelets: 145 10*3/uL — ABNORMAL LOW (ref 150–400)
RBC: 4.25 MIL/uL (ref 3.87–5.11)
RDW: 13.6 % (ref 11.5–15.5)
WBC: 5.4 10*3/uL (ref 4.0–10.5)

## 2014-05-08 LAB — GLUCOSE, CAPILLARY
GLUCOSE-CAPILLARY: 217 mg/dL — AB (ref 70–99)
GLUCOSE-CAPILLARY: 80 mg/dL (ref 70–99)
Glucose-Capillary: 147 mg/dL — ABNORMAL HIGH (ref 70–99)
Glucose-Capillary: 176 mg/dL — ABNORMAL HIGH (ref 70–99)

## 2014-05-08 MED ORDER — AMLODIPINE BESYLATE 5 MG PO TABS
5.0000 mg | ORAL_TABLET | Freq: Every day | ORAL | Status: DC
  Filled 2014-05-08: qty 1

## 2014-05-08 MED ORDER — INSULIN ASPART 100 UNIT/ML ~~LOC~~ SOLN
4.0000 [IU] | Freq: Three times a day (TID) | SUBCUTANEOUS | Status: DC
  Administered 2014-05-08 – 2014-05-10 (×4): 4 [IU] via SUBCUTANEOUS

## 2014-05-08 MED ORDER — CARVEDILOL 25 MG PO TABS
50.0000 mg | ORAL_TABLET | Freq: Two times a day (BID) | ORAL | Status: DC
  Administered 2014-05-08 – 2014-05-12 (×8): 50 mg via ORAL
  Filled 2014-05-08 (×11): qty 2

## 2014-05-08 MED ORDER — INSULIN DETEMIR 100 UNIT/ML ~~LOC~~ SOLN
20.0000 [IU] | Freq: Two times a day (BID) | SUBCUTANEOUS | Status: DC
  Administered 2014-05-08 – 2014-05-09 (×3): 20 [IU] via SUBCUTANEOUS
  Filled 2014-05-08 (×5): qty 0.2

## 2014-05-08 MED ORDER — ISOSORB DINITRATE-HYDRALAZINE 20-37.5 MG PO TABS
1.0000 | ORAL_TABLET | Freq: Two times a day (BID) | ORAL | Status: DC
  Administered 2014-05-08 – 2014-05-12 (×8): 1 via ORAL
  Filled 2014-05-08 (×10): qty 1

## 2014-05-08 NOTE — Progress Notes (Signed)
Patient ID: [name], female   DOB: 1966/02/10, 48 y.o.   MRN: [id number] I have reviewed the notes and will plan on proceeding with surgery this Friday afternoon for a left great toe amputation due to her osteo.

## 2014-05-08 NOTE — Progress Notes (Signed)
[facility] PROGRESS NOTE  [name] GLO:[id number] DOB: [birth year] DOA: 05/03/2014 PCP: [name], MD Interim summary 48 y.o. female with history of renal transplant, hypertension, hypothyroidism, diabetes mellitus type 1 who was recently admitted for cellulitis of the left foot and toe, presents to the ER with complaints of pain in the left toe, with open wound and mild drainage for last 2 days prior to admission. Admission complicated with acute renal failure (ATN). Now improving and with anticipated surgery for 05/11/14  Assessment/Plan: 1-left great toe osteomyelitis/left foot cellulitis: -continue IV antibiotics (daptomycin and cefepime) -plan is for left great toe amputation once patient medical stable -will follow ortho rec's; possible surgery anticipated for 9/4  2-acute on chronic renal failure (stage 2 at baseline): patient with hx of renal transplant -Continue IVF's; but will change to 75cc/hr (volume and BP now improved, but will require some extra to help with elevated CBG's) -renal service has been consulted and will follow their recommendations -continue prednisone, cellcept and prograf (level pending) -Creatinine is plateau at 3.4; expecting to start improving now. -Follow renal function trend -Vancomycin has been discontinue given renal failure and will continue daptomycin for now (most likely safe to stop after surgery)  3-metabolic acidosis: due to diarrhea and worsening renal failure -continue IVF's -Continue symptomatic treatment of her diarrhea (patient stools are neg for c. Diff) -Will continue bicarbonate tablets for now.  4-uncontrolled type 1 diabetes, with renal manifestations: last A1C 11.5 -continue low carb diet -No further episode of hypoglycemia; and now developing elevated CBGs -Will increase Levemir to 20 units twice a day and add meal coverage 4 units; continue SSI  5-HTN: stable and and with fair control since IVF's  resuscitation. Will continue current medication regimen and adjust as needed if blood pressure continued to be elevated.  6-hypothyroidism: continue synthroid   7-abd pain and diarrhea: normal KUB, neg c. Diff -will continue PRN analgesics medications -continue PPI -Continue as needed Imodium. -will follow results of CMV in her stools as recommended by renal service  Code Status: Full Family Communication: no family at bedside Disposition Plan: to be determine; anticipated toe amputation on 05/11/14; most likely discharge 24-48 hours after amputation (if everything stable)   Consultants: Dr. [name] (orthopedic service) Renal service (Dr. Arta Silence)  Procedures:  Left foot x-ray (demonstrating changes consistently with osteomyelitis)   Antibiotics:  Daptomycin  Cefepime    HPI/Subjective: Feeling ok overall, no fever; denies nausea/vomiting and reports diarrhea better. Will like to have foley removed  Objective: Filed Vitals:   05/08/14 1430  BP: 157/73  Pulse: 78  Temp: 98.2 F (36.8 C)  Resp:     Intake/Output Summary (Last 24 hours) at 05/08/14 1608 Last data filed at 05/08/14 1500  Gross per 24 hour  Intake 1772.18 ml  Output   2625 ml  Net -852.82 ml   Filed Weights   05/06/14 0512 05/07/14 0718 05/08/14 0515  Weight: 100.744 kg (222 lb 1.6 oz) 100.109 kg (220 lb 11.2 oz) 101.56 kg (223 lb 14.4 oz)    Exam:   General:  Feeling ok overall, no fever; reports diarrhea has improved significantly. Hypovolemia has now resolved and BP is stable.  Cardiovascular: S1 and S2, no rubs or gallops  Respiratory: CTA bilaterally.  Abdomen: soft, no guarding; no distension; mild discomfort with deep palpation (mainly mid abd area)  Musculoskeletal: Left foot without erythema now; but with painful wound in her left great toe (black scar on the tip of her toe; no drainage)  Data Reviewed: Basic Metabolic Panel:  Recent Labs Lab 05/05/14 0545 05/05/14 1358  05/06/14 0735 05/07/14 0444 05/08/14 0450  NA 141 142 136* 136* 137  K 4.3 4.5 4.5 4.5 4.5  CL 107 111 108 106 106  CO2 19 16* 14* 14* 18*  GLUCOSE 144* 124* 200* 396* 265*  BUN 35* 32* 28* 29* 29*  CREATININE 3.18* 3.27* 3.47* 3.47* 3.24*  CALCIUM 9.4 9.4 9.2 9.3 9.3  PHOS  --   --   --   --  3.2   Liver Function Tests:  Recent Labs Lab 05/03/14 1808 05/04/14 0525 05/08/14 0450  AST 14 13  --   ALT 16 12  --   ALKPHOS 112 78  --   BILITOT 0.6 0.6  --   PROT 7.9 6.2  --   ALBUMIN 3.7 2.8* 2.7*    Recent Labs Lab 05/04/14 0525  LIPASE 8*   CBC:  Recent Labs Lab 05/03/14 1845 05/04/14 0525 05/08/14 0450  WBC 6.1 8.7 5.4  NEUTROABS  --  7.6  --   HGB 14.9 13.5 12.5  HCT 44.1 41.6 38.8  MCV 90.9 91.8 91.3  PLT 217 172 145*   CBG:  Recent Labs Lab 05/07/14 1126 05/07/14 1718 05/07/14 2106 05/08/14 0715 05/08/14 1138  GLUCAP 301* 277* 253* 217* 147*    Recent Results (from the past 240 hour(s))  CLOSTRIDIUM DIFFICILE BY PCR     Status: None   Collection Time    05/04/14  3:42 AM      Result Value Ref Range Status   C difficile by pcr NEGATIVE  NEGATIVE Final   Comment: Performed at [facility]  SURGICAL PCR SCREEN     Status: None   Collection Time    05/04/14  6:08 PM      Result Value Ref Range Status   MRSA, PCR NEGATIVE  NEGATIVE Final   Staphylococcus aureus NEGATIVE  NEGATIVE Final   Comment:            The Xpert SA Assay (FDA     approved for NASAL specimens     in patients over 17 years of age),     is one component of     a comprehensive surveillance     program.  Test performance has     been validated by [employer] for patients greater     than or equal to 73 year old.     It is not intended     to diagnose infection nor to     guide or monitor treatment.     Studies: No results found.  Scheduled Meds: . carvedilol  50 mg Oral BID WC  . ceFEPime (MAXIPIME) IV  1 g Intravenous Q24H  . DAPTOmycin (CUBICIN)   IV  600 mg Intravenous Q48H  . enoxaparin (LOVENOX) injection  30 mg Subcutaneous Q24H  . famotidine  20 mg Oral Daily  . insulin aspart  0-9 Units Subcutaneous TID WC  . insulin aspart  4 Units Subcutaneous TID WC  . insulin detemir  20 Units Subcutaneous BID  . isosorbide-hydrALAZINE  1 tablet Oral BID  . levothyroxine  137 mcg Oral QAC breakfast  . mycophenolate  500 mg Oral BID  . pantoprazole  40 mg Oral Q1200  . predniSONE  5 mg Oral Daily  . sodium bicarbonate  1,300 mg Oral BID  . sodium chloride  3 mL Intravenous Q12H  . tacrolimus  1 mg Oral BID   Continuous Infusions: . sodium chloride 50 mL/hr at 05/08/14 1500    Principal Problem:   Osteomyelitis of toe of left foot Active Problems:   HYPERTENSION   DKA, type 1   Hypothyroidism   H/O kidney transplant   Diarrhea   Osteomyelitis    Time spent: >30 minutes    Vassie Loll  [facility] Pager [phone]. If 7PM-7AM, please contact night-coverage at [email or web address], password [facility] 05/08/2014, 4:08 PM  LOS: 5 days

## 2014-05-08 NOTE — Progress Notes (Signed)
Subjective: Interval History: has complaints wants to move ahead.  Objective: Vital signs in last 24 hours: Temp:  [97.7 F (36.5 C)-99.4 F (37.4 C)] 98.4 F (36.9 C) (09/01 1015) Pulse Rate:  [76-94] 94 (09/01 1015) Resp:  [16] 16 (09/01 1015) BP: (148-178)/(78-98) 148/78 mmHg (09/01 1015) SpO2:  [95 %-97 %] 97 % (09/01 1015) Weight:  [101.56 kg (223 lb 14.4 oz)] 101.56 kg (223 lb 14.4 oz) (09/01 0515) Weight change:   Intake/Output from previous day: 08/31 0701 - 09/01 0700 In: 1755 [I.V.:1655; IV Piggyback:100] Out: 2875 [Urine:2875] Intake/Output this shift: Total I/O In: 400 [P.O.:400] Out: -   General appearance: alert and moderately obese Resp: diminished breath sounds bilaterally Cardio: S1, S2 normal and systolic murmur: holosystolic 2/6, blowing at apex GI: 0bese, pos bs, liver down 4 cm, TX RLQ normal size and nontender Extremities: multiple amp of toes on R, Ulcer with eschar L great toe  Lab Results:  Recent Labs  05/08/14 0450  WBC 5.4  HGB 12.5  HCT 38.8  PLT 145*   BMET:  Recent Labs  05/07/14 0444 05/08/14 0450  NA 136* 137  K 4.5 4.5  CL 106 106  CO2 14* 18*  GLUCOSE 396* 265*  BUN 29* 29*  CREATININE 3.47* 3.24*  CALCIUM 9.3 9.3   No results found for this basename: PTH,  in the last 72 hours Iron Studies: No results found for this basename: IRON, TIBC, TRANSFERRIN, FERRITIN,  in the last 72 hours  Studies/Results: No results found.  I have reviewed the patient's current medications.  Assessment/Plan: 1 Renal Transplant with ATN.  Making good urine, Cr slowly decreasing.  Acid base better, K ok . Pending CMV, BK and prograf levels 2 HTN 3 DM controlled, no evidence DKA 4 Osteo hold off 2-3 d for op. P AB, lower ivf, await labs    LOS: 5 days   Alfredo Collymore L 05/08/2014,11:44 AM

## 2014-05-08 NOTE — Progress Notes (Addendum)
Inpatient Diabetes Program Recommendations  AACE/ADA: New Consensus Statement on Inpatient Glycemic Control (2013)  Target Ranges:  Prepandial:   less than 140 mg/dL      Peak postprandial:   less than 180 mg/dL (1-2 hours)      Critically ill patients:  140 - 180 mg/dL   Reason for Visit: Hyperglycemia Current orders for Inpatient glycemic control: Levemir 20 units bid and Novolog sensitive tidwc  Levemir increased this morning. Needs meal coverage insulin with high post-prandial blood sugars.  Recommendations: Add Novolog 4 units tidwc for meal coverage insulin if pt eats >50% meal.  Will continue to follow. Thank you. Ailene Ards, RD, LDN, CDE Inpatient Diabetes Coordinator [phone]

## 2014-05-09 DIAGNOSIS — N179 Acute kidney failure, unspecified: Secondary | ICD-10-CM

## 2014-05-09 DIAGNOSIS — N189 Chronic kidney disease, unspecified: Secondary | ICD-10-CM

## 2014-05-09 LAB — URINALYSIS, ROUTINE W REFLEX MICROSCOPIC
Bilirubin Urine: NEGATIVE
GLUCOSE, UA: NEGATIVE mg/dL
KETONES UR: NEGATIVE mg/dL
LEUKOCYTES UA: NEGATIVE
Nitrite: NEGATIVE
PROTEIN: 30 mg/dL — AB
Specific Gravity, Urine: 1.012 (ref 1.005–1.030)
UROBILINOGEN UA: 0.2 mg/dL (ref 0.0–1.0)
pH: 5.5 (ref 5.0–8.0)

## 2014-05-09 LAB — BK VIRUS QUANT PCR, URINE: BK VIRUS DNA QN PCR: NOT DETECTED {copies}/mL

## 2014-05-09 LAB — GLUCOSE, CAPILLARY
GLUCOSE-CAPILLARY: 192 mg/dL — AB (ref 70–99)
Glucose-Capillary: 91 mg/dL (ref 70–99)
Glucose-Capillary: 58 mg/dL — ABNORMAL LOW (ref 70–99)
Glucose-Capillary: 65 mg/dL — ABNORMAL LOW (ref 70–99)
Glucose-Capillary: 78 mg/dL (ref 70–99)
Glucose-Capillary: 225 mg/dL — ABNORMAL HIGH (ref 70–99)

## 2014-05-09 LAB — URINE MICROSCOPIC-ADD ON

## 2014-05-09 LAB — RENAL FUNCTION PANEL
Albumin: 2.6 g/dL — ABNORMAL LOW (ref 3.5–5.2)
Anion gap: 13 (ref 5–15)
BUN: 28 mg/dL — AB (ref 6–23)
CO2: 19 mEq/L (ref 19–32)
CREATININE: 3.03 mg/dL — AB (ref 0.50–1.10)
Calcium: 9.4 mg/dL (ref 8.4–10.5)
Chloride: 109 mEq/L (ref 96–112)
GFR calc Af Amer: 20 mL/min — ABNORMAL LOW (ref >90)
GFR calc non Af Amer: 17 mL/min — ABNORMAL LOW (ref >90)
Glucose, Bld: 103 mg/dL — ABNORMAL HIGH (ref 70–99)
PHOSPHORUS: 3.4 mg/dL (ref 2.3–4.6)
Potassium: 4.2 mEq/L (ref 3.7–5.3)
Sodium: 141 mEq/L (ref 137–147)

## 2014-05-09 LAB — CMV (CYTOMEGALOVIRUS) DNA ULTRAQUANT, PCR: CMV DNA Quant: <200 copies/mL

## 2014-05-09 MED ORDER — INSULIN DETEMIR 100 UNIT/ML ~~LOC~~ SOLN
18.0000 [IU] | Freq: Two times a day (BID) | SUBCUTANEOUS | Status: AC
  Administered 2014-05-09 – 2014-05-10 (×3): 18 [IU] via SUBCUTANEOUS
  Filled 2014-05-09 (×3): qty 0.18

## 2014-05-09 NOTE — Progress Notes (Signed)
ANTIBIOTIC CONSULT NOTE - Follow Up  Pharmacy Consult for Daptomycin, Cefepime Indication: Osteomyelitis  Allergies  Allergen Reactions  . Ace Inhibitors Other (See Comments)    Cough   ( Lisinopril )  . Adhesive [Tape] Itching    Patient Measurements: Height:  (172.7 cm) Weight: 226 lb (102.513 kg) IBW/kg (Calculated) : 63.9   Vital Signs: Temp: 98.7 F (37.1 C) (09/02 0536) Temp src: Oral (09/02 0536) BP: 166/76 mmHg (09/02 0536) Pulse Rate: 79 (09/02 0536) Intake/Output from previous day: 09/01 0701 - 09/02 0700 In: 2248.8 [P.O.:880; I.V.:1318.8; IV Piggyback:50] Out: 1900 [Urine:1900]  Labs:  Recent Labs  05/07/14 0444 05/08/14 0450 05/09/14 0459  WBC  --  5.4  --   HGB  --  12.5  --   PLT  --  145*  --   CREATININE 3.47* 3.24* 3.03*   Estimated Creatinine Clearance: 28.7 ml/min (by C-G formula based on Cr of 3.03). No results found for this basename: Rolm Gala, VANCORANDOM, GENTTROUGH, GENTPEAK, GENTRANDOM, TOBRATROUGH, TOBRAPEAK, TOBRARND, AMIKACINPEAK, AMIKACINTROU, AMIKACIN,  in the last 72 hours    Assessment: 48yo F admitted 8/27 with h/o DM, renal failure s/p kidney transplant in 2011, toe amputation. C/o left big toe swelling, pain, redness x 3 days.  Pharmacy was initially consulted to dose Vancomycin and Cefepime, but Vancomycin changed to Daptomycin for rising SCr on 8/29.  Day #6 Cefepime 2g q24h and day #5 Dapto 600 mg q48h (~ 5.9 mg/kg) for osteomyelitis. MD suspects infected tissue can be completely removed by amputation, but delayed d/t volume status & renal function.    8/27 >> zosyn x1 8/27 >>Vanco >> 8/29 8/27 >> Cefepime >> 8/29 >> Dapto >>  Tmax: afebrile WBCs: WNL Renal: SCr now improving, CrCl ~ 26N, ~ 31 (CG; ABW), Good UOP suggesting recovery (0.8-1.2 ml/kg/hr)  8/28 MRSA PCR: negative 8/28 Cdiff PCR: negative  Today is   Great left toe amputation delayed d/t volume status and renal fxn.  Goal of Therapy:   Appropriate abx dosing, eradication of infection.   Plan:   Adjust Cefepime dose to 1g IV q24h for worsening SCr.  Continue Daptomycin 600 (6 mg/kg) IV q48h  Follow up renal function (discontinued vancomycin; tacrolimus levels pending)  Follow up discontinuing antibiotics post-amputation if infection is well-contained  Cultures as available.  Bernadene Person, PharmD Pager: [phone] 05/09/2014, 11:22 AM

## 2014-05-09 NOTE — Progress Notes (Signed)
PROGRESS NOTE  SIMORA DINGEE ZOX:[id number] DOB: 03-Sep-1966 DOA: 05/03/2014 PCP: [name], MD  Assessment/Plan: left great toe osteomyelitis/left foot cellulitis:  -continue IV antibiotics (daptomycin and cefepime) -plan is for left great toe amputation 05/11/14 -Erythema has improved -Plan to send the patient home on oral antibiotics after amputation acute on chronic renal failure (stage 2 at baseline):  -patient with hx of renal transplant  -Continue IVF's; but will change to 75cc/hr (volume and BP now improved, but will require some extra to help with elevated CBG's)  -renal service has been consulted and will follow their recommendations  -continue prednisone, cellcept and prograf (level pending)  -Creatinine  plateau at 3.4; now improving -appreciate nephrology followup  -Vancomycin has been discontinue given renal failure and will continue daptomycin for now metabolic acidosis:  -due to diarrhea and worsening renal failure  -Continue symptomatic treatment of her diarrhea (patient stools are neg for c. Diff)  -Will continue bicarbonate tablets for now.  uncontrolled type 1 diabetes, with renal manifestations:  -last A1C 11.5  -continue low carb diet  -had mild hypoglycemia 05/09/14 -Will decrease Levemir to 18 units twice a day and add meal coverage 4 units; continue SSI  HTN:  -stable and and with fair control since IVF's resuscitation.  -continue current medication regimen and adjust as needed if blood pressure continued to be elevated.  hypothyroidism:  -continue synthroid  abd pain and diarrhea:  -improved, tolerating diet -normal KUB, neg c. Diff  -will continue PRN analgesics medications  -continue PPI  -Continue as needed Imodium.  -will follow results of CMV in her stools as recommended by renal service  Family Communication:   Pt at beside Disposition Plan:   Home when medically stable   Antibiotics:  Cubicin 05/05/14>>>  Cefepime  05/04/14>>>         Procedures/Studies: Dg Abd 1 View  05/04/2014   CLINICAL DATA:  Abdominal pain.  EXAM: ABDOMEN - 1 VIEW  COMPARISON:  March 23, 2014.  FINDINGS: The bowel gas pattern is normal. No radio-opaque calculi or other significant radiographic abnormality are seen.  IMPRESSION: No evidence of bowel obstruction or ileus.   Electronically Signed   By: Roque Lias M.D.   On: 05/04/2014 09:28   US Renal Transplant W/doppler  05/05/2014   CLINICAL DATA:  Acute renal failure. Patient has a transplant kidney in the right lower quadrant.  EXAM: ULTRASOUND OF RENAL TRANSPLANT WITH DOPPLER  TECHNIQUE: Ultrasound examination of the renal transplant was performed with gray-scale, color and duplex doppler evaluation.  COMPARISON:  03/05/2013.  FINDINGS: Transplant kidney location:  Right lower quadrant  Transplant kidney:  Length: 14.0. Normal in size and parenchymal echogenicity. No evidence of mass or hydronephrosis.  Color flow in the main renal artery:  Present  Color flow in the main renal vein:  Present  Duplex Doppler Evaluation  Resistive index in main renal artery: 0.8  Venous waveform in main renal vein:  present/absent  Resistive index in upper pole intrarenal artery: 0.76  Resistive index in lower pole intrarenal artery: 0.77  Bladder: Incompletely evaluated with a Foley balloon catheter in place.  Other findings:  No abnormal perinephric fluid collections.  IMPRESSION: 1. Transplant kidney is sonographically normal in appearance. Specifically, no hydronephrosis and no abnormal perinephric fluid collections. 2. Resistive indices, as above, similar compared to prior study 03/05/2013.   Electronically Signed   By: Trudie Reed M.D.   On: 05/05/2014 11:56  Dg Foot Complete Left  04/20/2014   CLINICAL DATA:  CELLULITIS TOE PAIN  EXAM: LEFT FOOT - COMPLETE 3+ VIEW  COMPARISON:  None.  FINDINGS: No acute fracture or dislocation. Joint spaces are maintained. No osseous erosions to suggest  active osteomyelitis. Diffuse osteopenia present. Posterior and plantar calcaneal enthesophyte is noted. Degenerative spurring present at the dorsal talonavicular joint.  No soft tissue emphysema or other abnormality identified. Prominent vascular calcifications present.  IMPRESSION: No acute abnormality about the left foot.   Electronically Signed   By: Rise Mu M.D.   On: 04/20/2014 01:28   Dg Toe Great Left  05/03/2014   CLINICAL DATA:  Diabetic wound  EXAM: LEFT GREAT TOE  COMPARISON:  April 20, 2014.  FINDINGS: Frontal, oblique, and lateral views were obtained. There is subtle osteomyelitis along the distal most aspect of the first distal phalanx. No other bony destruction. There is evidence of an old fracture with remodeling involving the second proximal phalanx. There is narrowing of all PIP and DIP joints. There is extensive arterial vascular calcification. No acute fracture or dislocation.  IMPRESSION: Subtle osteomyelitis involving the distal most aspect of the first distal phalanx.   Electronically Signed   By: Bretta Bang M.D.   On: 05/03/2014 18:46   Dg Toe Great Left  04/20/2014   CLINICAL DATA:  CELLULITIS TOE PAIN  EXAM: LEFT GREAT TOE  COMPARISON:  None.  FINDINGS: No acute fracture or dislocation. No osseous erosions to suggest active osteomyelitis identified. Joint spaces are maintained. No soft tissue abnormality. No radiopaque foreign body. Prominent vascular calcifications noted.  IMPRESSION: 1. No radiographic evidence of active infection or osteomyelitis identified. 2. No acute fracture dislocation.   Electronically Signed   By: Rise Mu M.D.   On: 04/20/2014 01:19         Subjective: Patient denies fevers, chills, headache, chest pain, dyspnea, nausea, vomiting, diarrhea, abdominal pain, dysuria, hematuria   Objective: Filed Vitals:   05/08/14 1430 05/08/14 2200 05/09/14 0536 05/09/14 1317  BP: 157/73 156/75 166/76 148/76  Pulse: 78 76 79  80  Temp: 98.2 F (36.8 C) 98.1 F (36.7 C) 98.7 F (37.1 C) 98.4 F (36.9 C)  TempSrc: Oral Oral Oral Oral  Resp:  Height:      Weight:   102.513 kg (226 lb)   SpO2: 96% 93% 97% 97%    Intake/Output Summary (Last 24 hours) at 05/09/14 1546 Last data filed at 05/09/14 1318  Gross per 24 hour  Intake   1720 ml  Output   1900 ml  Net   -180 ml   Weight change: 2.404 kg (5 lb 4.8 oz) Exam:   General:  Pt is alert, follows commands appropriately, not in acute distress  HEENT: No icterus, No thrush,  [place]/AT  Cardiovascular: RRR, S1/S2, no rubs, no gallops  Respiratory: CTA bilaterally, no wheezing, no crackles, no rhonchi  Abdomen: Soft/+BS, non tender, non distended, no guarding  Extremities: No edema, No lymphangitis, No petechiae, No rashes, no synovitis; right hallux with eschar-type lesion on the plantar surface without any drainage, crepitance, necrosis  Data Reviewed: Basic Metabolic Panel:  Recent Labs Lab 05/05/14 1358 05/06/14 0735 05/07/14 0444 05/08/14 0450 05/09/14 0459  NA 142 136* 136* 137 141  K 4.5 4.5 4.5 4.5 4.2  CL 111 108 106 106 109  CO2 16* 14* 14* 18* 19  GLUCOSE 124* 200* 396* 265* 103*  BUN 32* 28* 29* 29* 28*  CREATININE  3.27* 3.47* 3.47* 3.24* 3.03*  CALCIUM 9.4 9.2 9.3 9.3 9.4  PHOS  --   --   --  3.2 3.4   Liver Function Tests:  Recent Labs Lab 05/03/14 1808 05/04/14 0525 05/08/14 0450 05/09/14 0459  AST 14 13  --   --   ALT 16 12  --   --   ALKPHOS 112 78  --   --   BILITOT 0.6 0.6  --   --   PROT 7.9 6.2  --   --   ALBUMIN 3.7 2.8* 2.7* 2.6*    Recent Labs Lab 05/04/14 0525  LIPASE 8*   No results found for this basename: AMMONIA,  in the last 168 hours CBC:  Recent Labs Lab 05/03/14 1845 05/04/14 0525 05/08/14 0450  WBC 6.1 8.7 5.4  NEUTROABS  --  7.6  --   HGB 14.9 13.5 12.5  HCT 44.1 41.6 38.8  MCV 90.9 91.8 91.3  PLT 217 172 145*   Cardiac Enzymes: No results found for this basename:  CKTOTAL, CKMB, CKMBINDEX, TROPONINI,  in the last 168 hours BNP: No components found with this basename: POCBNP,  CBG:  Recent Labs Lab 05/08/14 2134 05/09/14 0709 05/09/14 1038 05/09/14 1058 05/09/14 1114  GLUCAP 80 91 58* 65* 78    Recent Results (from the past 240 hour(s))  CLOSTRIDIUM DIFFICILE BY PCR     Status: None   Collection Time    05/04/14  3:42 AM      Result Value Ref Range Status   C difficile by pcr NEGATIVE  NEGATIVE Final   Comment: Performed at [facility]  SURGICAL PCR SCREEN     Status: None   Collection Time    05/04/14  6:08 PM      Result Value Ref Range Status   MRSA, PCR NEGATIVE  NEGATIVE Final   Staphylococcus aureus NEGATIVE  NEGATIVE Final   Comment:            The Xpert SA Assay (FDA     approved for NASAL specimens     in patients over 45 years of age),     is one component of     a comprehensive surveillance     program.  Test performance has     been validated by [employer] for patients greater     than or equal to 3 year old.     It is not intended     to diagnose infection nor to     guide or monitor treatment.     Scheduled Meds: . carvedilol  50 mg Oral BID WC  . ceFEPime (MAXIPIME) IV  1 g Intravenous Q24H  . DAPTOmycin (CUBICIN)  IV  600 mg Intravenous Q48H  . enoxaparin (LOVENOX) injection  30 mg Subcutaneous Q24H  . famotidine  20 mg Oral Daily  . insulin aspart  0-9 Units Subcutaneous TID WC  . insulin aspart  4 Units Subcutaneous TID WC  . insulin detemir  20 Units Subcutaneous BID  . isosorbide-hydrALAZINE  1 tablet Oral BID  . levothyroxine  137 mcg Oral QAC breakfast  . mycophenolate  500 mg Oral BID  . pantoprazole  40 mg Oral Q1200  . predniSONE  5 mg Oral Daily  . sodium bicarbonate  1,300 mg Oral BID  . sodium chloride  3 mL Intravenous Q12H  . tacrolimus  1 mg Oral BID   Continuous Infusions:    Sammi Stolarz,  DO  [facility] Pager [phone]  If 7PM-7AM, please contact  night-coverage [email or web address] Password [email or web address] 05/09/2014, 3:46 PM   LOS: 6 days

## 2014-05-09 NOTE — Progress Notes (Signed)
Hypoglycemic Event  CBG: 58  Treatment: 15 GM carbohydrate snack  Symptoms: headache. "pt stated I feel like my sugar is low"  Follow-up CBG: Time:1055 & 1115 CBG Result:65 & 78  Possible Reasons for Event: Medication regimen: pt gets 4 units meal coverage and CBG this a.m was 91  Comments/MD notified:MD aware.    Jodi Geralds D  Remember to initiate Hypoglycemia Order Set & complete

## 2014-05-09 NOTE — Progress Notes (Signed)
Subjective: Interval History: has complaints had some blood in urine.  Objective: Vital signs in last 24 hours: Temp:  [98.1 F (36.7 C)-98.7 F (37.1 C)] 98.7 F (37.1 C) (09/02 0536) Pulse Rate:  [76-79] 79 (09/02 0536) Resp:  [16-18] 18 (09/02 0536) BP: (156-166)/(73-76) 166/76 mmHg (09/02 0536) SpO2:  [93 %-97 %] 97 % (09/02 0536) Weight:  [102.513 kg (226 lb)] 102.513 kg (226 lb) (09/02 0536) Weight change: 2.404 kg (5 lb 4.8 oz)  Intake/Output from previous day: 09/01 0701 - 09/02 0700 In: 2248.8 [P.O.:880; I.V.:1318.8; IV Piggyback:50] Out: 1900 [Urine:1900] Intake/Output this shift: Total I/O In: 240 [P.O.:240] Out: 650 [Urine:650]  General appearance: alert, cooperative and moderately obese Resp: clear to auscultation bilaterally Cardio: S1, S2 normal and systolic murmur: holosystolic 2/6, blowing at apex GI: obese, pos bs, liver down 5 cm, TX RLQ normal size and not tender Extremities: eschar L great toe  Lab Results:  Recent Labs  05/08/14 0450  WBC 5.4  HGB 12.5  HCT 38.8  PLT 145*   BMET:  Recent Labs  05/08/14 0450 05/09/14 0459  NA 137 141  K 4.5 4.2  CL 106 109  CO2 18* 19  GLUCOSE 265* 103*  BUN 29* 28*  CREATININE 3.24* 3.03*  CALCIUM 9.3 9.4   No results found for this basename: PTH,  in the last 72 hours Iron Studies: No results found for this basename: IRON, TIBC, TRANSFERRIN, FERRITIN,  in the last 72 hours  Studies/Results: No results found.  I have reviewed the patient's current medications.  Assessment/Plan: 1 Renal Tx improving. Will check UA, suspect catheter, DVT prophylaxis . Can stop IV 2 HTN stop fluid, cont meds 3 DM controlled 4 Osteo of toe ok to op if cont improve by Fri P AB, stop IVF, UA    LOS: 6 days   Alexi Dorminey L 05/09/2014,10:57 AM

## 2014-05-10 LAB — RENAL FUNCTION PANEL
ANION GAP: 13 (ref 5–15)
Albumin: 2.5 g/dL — ABNORMAL LOW (ref 3.5–5.2)
BUN: 29 mg/dL — ABNORMAL HIGH (ref 6–23)
CO2: 21 mEq/L (ref 19–32)
Calcium: 9.5 mg/dL (ref 8.4–10.5)
Chloride: 108 mEq/L (ref 96–112)
Creatinine, Ser: 2.99 mg/dL — ABNORMAL HIGH (ref 0.50–1.10)
GFR calc Af Amer: 20 mL/min — ABNORMAL LOW (ref >90)
GFR calc non Af Amer: 18 mL/min — ABNORMAL LOW (ref >90)
GLUCOSE: 219 mg/dL — AB (ref 70–99)
POTASSIUM: 4.6 meq/L (ref 3.7–5.3)
Phosphorus: 3.4 mg/dL (ref 2.3–4.6)
Sodium: 142 mEq/L (ref 137–147)

## 2014-05-10 LAB — URINE CULTURE
COLONY COUNT: NO GROWTH
CULTURE: NO GROWTH

## 2014-05-10 LAB — BASIC METABOLIC PANEL
Anion gap: 12 (ref 5–15)
BUN: 30 mg/dL — AB (ref 6–23)
CO2: 22 mEq/L (ref 19–32)
CREATININE: 3.02 mg/dL — AB (ref 0.50–1.10)
Calcium: 9.5 mg/dL (ref 8.4–10.5)
Chloride: 105 mEq/L (ref 96–112)
GFR calc Af Amer: 20 mL/min — ABNORMAL LOW (ref >90)
GFR, EST NON AFRICAN AMERICAN: 17 mL/min — AB (ref >90)
Glucose, Bld: 215 mg/dL — ABNORMAL HIGH (ref 70–99)
POTASSIUM: 4.6 meq/L (ref 3.7–5.3)
Sodium: 139 mEq/L (ref 137–147)

## 2014-05-10 LAB — GLUCOSE, CAPILLARY
Glucose-Capillary: 266 mg/dL — ABNORMAL HIGH (ref 70–99)
Glucose-Capillary: 113 mg/dL — ABNORMAL HIGH (ref 70–99)
Glucose-Capillary: 194 mg/dL — ABNORMAL HIGH (ref 70–99)
Glucose-Capillary: 281 mg/dL — ABNORMAL HIGH (ref 70–99)

## 2014-05-10 LAB — TACROLIMUS LEVEL: TACROLIMUS (FK506) - LABCORP: 9.4 ng/mL

## 2014-05-10 MED ORDER — INSULIN DETEMIR 100 UNIT/ML ~~LOC~~ SOLN
18.0000 [IU] | Freq: Two times a day (BID) | SUBCUTANEOUS | Status: DC
  Administered 2014-05-11 – 2014-05-12 (×2): 18 [IU] via SUBCUTANEOUS
  Filled 2014-05-10 (×3): qty 0.18

## 2014-05-10 NOTE — Progress Notes (Signed)
PROGRESS NOTE  [name] ZOX:[id number] DOB: 24-Mar-1966 DOA: 05/03/2014 PCP: [name], MD  Assessment/Plan: left great toe osteomyelitis/left foot cellulitis:  -continue IV antibiotics (daptomycin and cefepime)  -plan is for left great toe amputation 05/11/14-->please send bone cultures  -Erythema has improved  -Plan to send the patient home on oral antibiotics after amputation  acute on chronic renal failure (stage 2 at baseline):  -patient with hx of renal transplant  -continue prednisone, cellcept and prograf (level pending)  -Creatinine plateau at 3.4; now improving slowly -appreciate nephrology followup  -Vancomycin has been discontinue given renal failure and will continue daptomycin for now--check CK of daptomycin metabolic acidosis:  -due to diarrhea and worsening renal failure  -Continue symptomatic treatment of her diarrhea (patient stools are neg for c. Diff)  -continue bicarbonate tablets for now.  uncontrolled type 1 diabetes, with renal manifestations:  -hold am 05/11/14  dose of levemir as pt will be npo for surgery -last A1C 11.5  -continue low carb diet  -had mild hypoglycemia 05/09/14  -Will decrease Levemir to 18 units twice a day and add meal coverage 4 units; continue SSI  HTN:  -stable and and with fair control since IVF's resuscitation.  -continue current medication regimen and adjust as needed if blood pressure continued to be elevated.  hypothyroidism:  -continue synthroid  abd pain and diarrhea:  -improved, tolerating diet  -normal KUB, neg c. Diff  -will continue PRN analgesics medications  -continue PPI  -Continue as needed Imodium.  -CMV and BK virus PCR are neg Family Communication: Pt at beside  Disposition Plan: Home when medically stable   Antibiotics:  Cubicin 05/05/2014>>>  Cefepime 05/04/2014>>>     Procedures/Studies: Dg Abd 1 View  05/04/2014   CLINICAL DATA:  Abdominal pain.  EXAM: ABDOMEN - 1 VIEW   COMPARISON:  March 23, 2014.  FINDINGS: The bowel gas pattern is normal. No radio-opaque calculi or other significant radiographic abnormality are seen.  IMPRESSION: No evidence of bowel obstruction or ileus.   Electronically Signed   By: Roque Lias M.D.   On: 05/04/2014 09:28   US Renal Transplant W/doppler  05/05/2014   CLINICAL DATA:  Acute renal failure. Patient has a transplant kidney in the right lower quadrant.  EXAM: ULTRASOUND OF RENAL TRANSPLANT WITH DOPPLER  TECHNIQUE: Ultrasound examination of the renal transplant was performed with gray-scale, color and duplex doppler evaluation.  COMPARISON:  03/05/2013.  FINDINGS: Transplant kidney location:  Right lower quadrant  Transplant kidney:  Length: 14.0. Normal in size and parenchymal echogenicity. No evidence of mass or hydronephrosis.  Color flow in the main renal artery:  Present  Color flow in the main renal vein:  Present  Duplex Doppler Evaluation  Resistive index in main renal artery: 0.8  Venous waveform in main renal vein:  present/absent  Resistive index in upper pole intrarenal artery: 0.76  Resistive index in lower pole intrarenal artery: 0.77  Bladder: Incompletely evaluated with a Foley balloon catheter in place.  Other findings:  No abnormal perinephric fluid collections.  IMPRESSION: 1. Transplant kidney is sonographically normal in appearance. Specifically, no hydronephrosis and no abnormal perinephric fluid collections. 2. Resistive indices, as above, similar compared to prior study 03/05/2013.   Electronically Signed   By: Trudie Reed M.D.   On: 05/05/2014 11:56   Dg Foot Complete Left  04/20/2014   CLINICAL DATA:  CELLULITIS TOE PAIN  EXAM: LEFT FOOT - COMPLETE 3+ VIEW  COMPARISON:  None.  FINDINGS: No acute fracture or dislocation. Joint spaces are maintained. No osseous erosions to suggest active osteomyelitis. Diffuse osteopenia present. Posterior and plantar calcaneal enthesophyte is noted. Degenerative spurring present at  the dorsal talonavicular joint.  No soft tissue emphysema or other abnormality identified. Prominent vascular calcifications present.  IMPRESSION: No acute abnormality about the left foot.   Electronically Signed   By: Rise Mu M.D.   On: 04/20/2014 01:28   Dg Toe Great Left  05/03/2014   CLINICAL DATA:  Diabetic wound  EXAM: LEFT GREAT TOE  COMPARISON:  April 20, 2014.  FINDINGS: Frontal, oblique, and lateral views were obtained. There is subtle osteomyelitis along the distal most aspect of the first distal phalanx. No other bony destruction. There is evidence of an old fracture with remodeling involving the second proximal phalanx. There is narrowing of all PIP and DIP joints. There is extensive arterial vascular calcification. No acute fracture or dislocation.  IMPRESSION: Subtle osteomyelitis involving the distal most aspect of the first distal phalanx.   Electronically Signed   By: Bretta Bang M.D.   On: 05/03/2014 18:46   Dg Toe Great Left  04/20/2014   CLINICAL DATA:  CELLULITIS TOE PAIN  EXAM: LEFT GREAT TOE  COMPARISON:  None.  FINDINGS: No acute fracture or dislocation. No osseous erosions to suggest active osteomyelitis identified. Joint spaces are maintained. No soft tissue abnormality. No radiopaque foreign body. Prominent vascular calcifications noted.  IMPRESSION: 1. No radiographic evidence of active infection or osteomyelitis identified. 2. No acute fracture dislocation.   Electronically Signed   By: Rise Mu M.D.   On: 04/20/2014 01:19         Subjective: Patient denies fevers, chills, headache, chest pain, dyspnea, nausea, vomiting, diarrhea, abdominal pain, dysuria, hematuria   Objective: Filed Vitals:   05/09/14 2149 05/10/14 0548 05/10/14 0702 05/10/14 1400  BP: 174/75 171/85  152/74  Pulse: 74 76  80  Temp: 98.1 F (36.7 C) 97.8 F (36.6 C)  97.9 F (36.6 C)  TempSrc: Oral Oral  Oral  Resp: Height:      Weight:   102.6 kg  (226 lb 3.1 oz)   SpO2: 96% 97%  97%    Intake/Output Summary (Last 24 hours) at 05/10/14 1523 Last data filed at 05/10/14 1414  Gross per 24 hour  Intake   2176 ml  Output   2650 ml  Net   -474 ml   Weight change:  Exam:   General:  Pt is alert, follows commands appropriately, not in acute distress  HEENT: No icterus, No thrush,  [place]/AT  Cardiovascular: RRR, S1/S2, no rubs, no gallops  Respiratory: CTA bilaterally, no wheezing, no crackles, no rhonchi  Abdomen: Soft/+BS, non tender, non distended, no guarding Extremities: No edema, No lymphangitis, No petechiae, No rashes, no synovitis; right hallux with eschar-type lesion on the plantar surface without any drainage, crepitance, necrosis   Data Reviewed: Basic Metabolic Panel:  Recent Labs Lab 05/06/14 0735 05/07/14 0444 05/08/14 0450 05/09/14 0459 05/10/14 0435  NA 136* 136* 137 141 139  142  K 4.5 4.5 4.5 4.2 4.6  4.6  CL 108 106 106 109 105  108  CO2 14* 14* 18* GLUCOSE 200* 396* 265* 103* 215*  219*  BUN 28* 29* 29* 28* 30*  29*  CREATININE 3.47* 3.47* 3.24* 3.03* 3.02*  2.99*  CALCIUM 9.2 9.3 9.3 9.4 9.5  9.5  PHOS  --   --  3.2 3.4 3.4   Liver Function Tests:  Recent Labs Lab 05/03/14 1808 05/04/14 0525 05/08/14 0450 05/09/14 0459 05/10/14 0435  AST 14 13  --   --   --   ALT 16 12  --   --   --   ALKPHOS 112 78  --   --   --   BILITOT 0.6 0.6  --   --   --   PROT 7.9 6.2  --   --   --   ALBUMIN 3.7 2.8* 2.7* 2.6* 2.5*    Recent Labs Lab 05/04/14 0525  LIPASE 8*   No results found for this basename: AMMONIA,  in the last 168 hours CBC:  Recent Labs Lab 05/03/14 1845 05/04/14 0525 05/08/14 0450  WBC 6.1 8.7 5.4  NEUTROABS  --  7.6  --   HGB 14.9 13.5 12.5  HCT 44.1 41.6 38.8  MCV 90.9 91.8 91.3  PLT 217 172 145*   Cardiac Enzymes: No results found for this basename: CKTOTAL, CKMB, CKMBINDEX, TROPONINI,  in the last 168 hours BNP: No components found with  this basename: POCBNP,  CBG:  Recent Labs Lab 05/09/14 1114 05/09/14 1620 05/09/14 2219 05/10/14 0749 05/10/14 1248  GLUCAP 78 225* 192* 266* 113*    Recent Results (from the past 240 hour(s))  CLOSTRIDIUM DIFFICILE BY PCR     Status: None   Collection Time    05/04/14  3:42 AM      Result Value Ref Range Status   C difficile by pcr NEGATIVE  NEGATIVE Final   Comment: Performed at [facility]  SURGICAL PCR SCREEN     Status: None   Collection Time    05/04/14  6:08 PM      Result Value Ref Range Status   MRSA, PCR NEGATIVE  NEGATIVE Final   Staphylococcus aureus NEGATIVE  NEGATIVE Final   Comment:            The Xpert SA Assay (FDA     approved for NASAL specimens     in patients over 36 years of age),     is one component of     a comprehensive surveillance     program.  Test performance has     been validated by [employer] for patients greater     than or equal to 39 year old.     It is not intended     to diagnose infection nor to     guide or monitor treatment.     Scheduled Meds: . carvedilol  50 mg Oral BID WC  . ceFEPime (MAXIPIME) IV  1 g Intravenous Q24H  . DAPTOmycin (CUBICIN)  IV  600 mg Intravenous Q48H  . enoxaparin (LOVENOX) injection  30 mg Subcutaneous Q24H  . famotidine  20 mg Oral Daily  . insulin aspart  0-9 Units Subcutaneous TID WC  . insulin aspart  4 Units Subcutaneous TID WC  . insulin detemir  18 Units Subcutaneous BID  . isosorbide-hydrALAZINE  1 tablet Oral BID  . levothyroxine  137 mcg Oral QAC breakfast  . mycophenolate  500 mg Oral BID  . pantoprazole  40 mg Oral Q1200  . predniSONE  5 mg Oral Daily  . sodium bicarbonate  1,300 mg Oral BID  . sodium chloride  3 mL Intravenous Q12H  . tacrolimus  1 mg Oral BID   Continuous Infusions:  Zenora Karpel, DO  [facility] Pager [phone]  If 7PM-7AM, please contact night-coverage [email or web address] Password [email or web address] 05/10/2014, 3:23 PM   LOS: 7 days

## 2014-05-10 NOTE — Progress Notes (Signed)
Patient ID: [name], female   DOB: 08/18/1966, 48 y.o.   MRN: [id number] No acute changes.  Creatinine improving.  Clearance obtained for surgery.  She understands fully that we will proceed to the OR tomorrow 9/4 late in the day for a left great toe amputation to treat her acute osteomyelitis.

## 2014-05-10 NOTE — Progress Notes (Signed)
Subjective: Interval History: has no complaint .  Objective: Vital signs in last 24 hours: Temp:  [97.8 F (36.6 C)-98.4 F (36.9 C)] 97.8 F (36.6 C) (09/03 0548) Pulse Rate:  [74-80] 76 (09/03 0548) Resp:  [18] 18 (09/03 0548) BP: (148-174)/(75-85) 171/85 mmHg (09/03 0548) SpO2:  [96 %-97 %] 97 % (09/03 0548) Weight:  [102.6 kg (226 lb 3.1 oz)] 102.6 kg (226 lb 3.1 oz) (09/03 0702) Weight change:   Intake/Output from previous day: 09/02 0701 - 09/03 0700 In: 2133 [P.O.:1930; I.V.:203] Out: 3050 [Urine:3050] Intake/Output this shift: Total I/O In: 363 [P.O.:360; I.V.:3] Out: 300 [Urine:300]  General appearance: alert, cooperative and moderately obese Resp: clear to auscultation bilaterally Cardio: S1, S2 normal and systolic murmur: holosystolic 2/6, blowing at apex GI: pos bs,liver down 4 cm, TX RLQ ok Extremities: eschar L great toe  Lab Results:  Recent Labs  05/08/14 0450  WBC 5.4  HGB 12.5  HCT 38.8  PLT 145*   BMET:  Recent Labs  05/09/14 0459 05/10/14 0435  NA 141 139  142  K 4.2 4.6  4.6  CL 109 105  108  CO2 GLUCOSE 103* 215*  219*  BUN 28* 30*  29*  CREATININE 3.03* 3.02*  2.99*  CALCIUM 9.4 9.5  9.5   No results found for this basename: PTH,  in the last 72 hours Iron Studies: No results found for this basename: IRON, TIBC, TRANSFERRIN, FERRITIN,  in the last 72 hours  Studies/Results: No results found.  I have reviewed the patient's current medications.  Assessment/Plan: 1 Renal TX Cr stable.  Nonoliguric . Prograf level mildly ^, no change in dose.  CMV and BK neg. Bicarb better 2 HTN allow to diruese 3 DM controlled 4 Hematuria culture Pending 5 PVD with osteo per Ortho P follow Cr, bicarb, AB, ? resx   LOS: 7 days   Kashira Behunin L 05/10/2014,10:56 AM

## 2014-05-11 ENCOUNTER — Inpatient Hospital Stay (HOSPITAL_COMMUNITY): Payer: Medicare Other | Admitting: Anesthesiology

## 2014-05-11 ENCOUNTER — Encounter (HOSPITAL_COMMUNITY): Payer: Medicare Other | Admitting: Anesthesiology

## 2014-05-11 ENCOUNTER — Encounter (HOSPITAL_COMMUNITY)
Admission: EM | Disposition: A | Discharge status: Home / Self Care | Payer: Self-pay | Source: Home / Self Care | Attending: Internal Medicine

## 2014-05-11 HISTORY — PX: AMPUTATION: SHX166

## 2014-05-11 LAB — GLUCOSE, CAPILLARY
GLUCOSE-CAPILLARY: 161 mg/dL — AB (ref 70–99)
Glucose-Capillary: 175 mg/dL — ABNORMAL HIGH (ref 70–99)
Glucose-Capillary: 223 mg/dL — ABNORMAL HIGH (ref 70–99)
Glucose-Capillary: 331 mg/dL — ABNORMAL HIGH (ref 70–99)
Glucose-Capillary: 483 mg/dL — ABNORMAL HIGH (ref 70–99)
Glucose-Capillary: 526 mg/dL — ABNORMAL HIGH (ref 70–99)

## 2014-05-11 LAB — BASIC METABOLIC PANEL
ANION GAP: 12 (ref 5–15)
BUN: 35 mg/dL — ABNORMAL HIGH (ref 6–23)
CALCIUM: 9.3 mg/dL (ref 8.4–10.5)
CO2: 22 mEq/L (ref 19–32)
Chloride: 102 mEq/L (ref 96–112)
Creatinine, Ser: 2.93 mg/dL — ABNORMAL HIGH (ref 0.50–1.10)
GFR, EST AFRICAN AMERICAN: 21 mL/min — AB (ref >90)
GFR, EST NON AFRICAN AMERICAN: 18 mL/min — AB (ref >90)
Glucose, Bld: 396 mg/dL — ABNORMAL HIGH (ref 70–99)
Potassium: 4.9 mEq/L (ref 3.7–5.3)
SODIUM: 136 meq/L — AB (ref 137–147)

## 2014-05-11 SURGERY — AMPUTATION DIGIT
Anesthesia: Regional | Site: Toe | Laterality: Left

## 2014-05-11 MED ORDER — PROMETHAZINE HCL 25 MG/ML IJ SOLN
6.2500 mg | INTRAMUSCULAR | Status: DC | PRN
  Administered 2014-05-11: 12.5 mg via INTRAVENOUS
  Filled 2014-05-11: qty 1

## 2014-05-11 MED ORDER — EPHEDRINE SULFATE 50 MG/ML IJ SOLN
INTRAMUSCULAR | Status: AC
  Filled 2014-05-11: qty 1

## 2014-05-11 MED ORDER — INSULIN ASPART 100 UNIT/ML ~~LOC~~ SOLN: 0.0000 [IU] | Freq: Three times a day (TID) | SUBCUTANEOUS | Status: DC

## 2014-05-11 MED ORDER — LACTATED RINGERS IV SOLN
INTRAVENOUS | Status: DC
  Administered 2014-05-11: 1000 mL via INTRAVENOUS
  Filled 2014-05-11: qty 1000

## 2014-05-11 MED ORDER — FENTANYL CITRATE 0.05 MG/ML IJ SOLN
INTRAMUSCULAR | Status: AC
  Filled 2014-05-11: qty 2

## 2014-05-11 MED ORDER — SODIUM CHLORIDE 0.9 % IJ SOLN
INTRAMUSCULAR | Status: AC
  Filled 2014-05-11: qty 10

## 2014-05-11 MED ORDER — BUPIVACAINE-EPINEPHRINE (PF) 0.5% -1:200000 IJ SOLN
INTRAMUSCULAR | Status: AC
  Filled 2014-05-11: qty 30

## 2014-05-11 MED ORDER — INSULIN ASPART 100 UNIT/ML ~~LOC~~ SOLN: 0.0000 [IU] | Freq: Every day | SUBCUTANEOUS | Status: DC

## 2014-05-11 MED ORDER — PROPOFOL INFUSION 10 MG/ML OPTIME
INTRAVENOUS | Status: DC | PRN
  Administered 2014-05-11: 160 ug/kg/min via INTRAVENOUS

## 2014-05-11 MED ORDER — 0.9 % SODIUM CHLORIDE (POUR BTL) OPTIME
TOPICAL | Status: DC | PRN
  Administered 2014-05-11: 1000 mL

## 2014-05-11 MED ORDER — MIDAZOLAM HCL 2 MG/2ML IJ SOLN: 0.5000 mg | Freq: Once | INTRAMUSCULAR | Status: AC | PRN

## 2014-05-11 MED ORDER — LACTATED RINGERS IV SOLN: INTRAVENOUS | Status: DC

## 2014-05-11 MED ORDER — FENTANYL CITRATE 0.05 MG/ML IJ SOLN
25.0000 ug | INTRAMUSCULAR | Status: DC | PRN
Start: 2014-05-11 — End: 2014-05-11
  Administered 2014-05-11: 25 ug via INTRAVENOUS

## 2014-05-11 MED ORDER — METHYLPREDNISOLONE SODIUM SUCC 125 MG IJ SOLR
60.0000 mg | Freq: Once | INTRAMUSCULAR | Status: AC
  Administered 2014-05-11: 60 mg via INTRAVENOUS
  Filled 2014-05-11: qty 0.96

## 2014-05-11 MED ORDER — MIDAZOLAM HCL 5 MG/5ML IJ SOLN
INTRAMUSCULAR | Status: DC | PRN
  Administered 2014-05-11 (×2): 1 mg via INTRAVENOUS

## 2014-05-11 MED ORDER — BUPIVACAINE HCL (PF) 0.25 % IJ SOLN
INTRAMUSCULAR | Status: AC
  Filled 2014-05-11: qty 30

## 2014-05-11 MED ORDER — MEPERIDINE HCL 50 MG/ML IJ SOLN: 6.2500 mg | INTRAMUSCULAR | Status: DC | PRN

## 2014-05-11 MED ORDER — INSULIN ASPART 100 UNIT/ML ~~LOC~~ SOLN
8.0000 [IU] | Freq: Once | SUBCUTANEOUS | Status: AC
  Administered 2014-05-11: 8 [IU] via SUBCUTANEOUS

## 2014-05-11 MED ORDER — FENTANYL CITRATE 0.05 MG/ML IJ SOLN
INTRAMUSCULAR | Status: DC | PRN
  Administered 2014-05-11 (×2): 50 ug via INTRAVENOUS

## 2014-05-11 MED ORDER — BUPIVACAINE HCL 0.25 % IJ SOLN
INTRAMUSCULAR | Status: DC | PRN
  Administered 2014-05-11: 10 mL

## 2014-05-11 MED ORDER — FENTANYL CITRATE 0.05 MG/ML IJ SOLN: 25.0000 ug | INTRAMUSCULAR | Status: DC | PRN

## 2014-05-11 MED ORDER — PROPOFOL 10 MG/ML IV BOLUS
INTRAVENOUS | Status: AC
  Filled 2014-05-11: qty 20

## 2014-05-11 MED ORDER — MIDAZOLAM HCL 2 MG/2ML IJ SOLN
INTRAMUSCULAR | Status: AC
  Filled 2014-05-11: qty 2

## 2014-05-11 MED ORDER — PROMETHAZINE HCL 25 MG/ML IJ SOLN: 6.2500 mg | INTRAMUSCULAR | Status: DC | PRN

## 2014-05-11 MED ORDER — GLYCOPYRROLATE 0.2 MG/ML IJ SOLN
INTRAMUSCULAR | Status: AC
  Filled 2014-05-11: qty 1

## 2014-05-11 MED ORDER — BUPIVACAINE-EPINEPHRINE (PF) 0.5% -1:200000 IJ SOLN
INTRAMUSCULAR | Status: DC | PRN
  Administered 2014-05-11: 30 mL

## 2014-05-11 MED ORDER — INSULIN ASPART 100 UNIT/ML ~~LOC~~ SOLN
0.0000 [IU] | SUBCUTANEOUS | Status: DC
  Administered 2014-05-11: 5 [IU] via SUBCUTANEOUS
  Filled 2014-05-11: qty 0.15

## 2014-05-11 MED ORDER — LACTATED RINGERS IV SOLN
INTRAVENOUS | Status: DC | PRN
  Administered 2014-05-11: 15:00:00 via INTRAVENOUS

## 2014-05-11 MED ORDER — INSULIN ASPART 100 UNIT/ML ~~LOC~~ SOLN
10.0000 [IU] | Freq: Once | SUBCUTANEOUS | Status: AC
  Administered 2014-05-12: 10 [IU] via SUBCUTANEOUS

## 2014-05-11 MED ORDER — LACTATED RINGERS IV SOLN
INTRAVENOUS | Status: DC
  Administered 2014-05-12: 09:00:00 via INTRAVENOUS

## 2014-05-11 SURGICAL SUPPLY — 33 items
BAG ZIPLOCK 12X15 (MISCELLANEOUS) ×2 IMPLANT
BANDAGE ESMARK 6X9 LF (GAUZE/BANDAGES/DRESSINGS) ×1 IMPLANT
BNDG COHESIVE 3X5 TAN STRL LF (GAUZE/BANDAGES/DRESSINGS) ×2 IMPLANT
BNDG ESMARK 6X9 LF (GAUZE/BANDAGES/DRESSINGS) ×2
BNDG GAUZE ELAST 4 BULKY (GAUZE/BANDAGES/DRESSINGS) ×2 IMPLANT
CUFF TOURN SGL QUICK 34 (TOURNIQUET CUFF) ×1
CUFF TRNQT CYL 34X4X40X1 (TOURNIQUET CUFF) ×1 IMPLANT
DRAPE U-SHAPE 47X51 STRL (DRAPES) ×2 IMPLANT
DRSG XEROFORM 1X8 (GAUZE/BANDAGES/DRESSINGS) ×2 IMPLANT
DURAPREP 26ML APPLICATOR (WOUND CARE) ×2 IMPLANT
ELECT REM PT RETURN 9FT ADLT (ELECTROSURGICAL) ×2
ELECTRODE REM PT RTRN 9FT ADLT (ELECTROSURGICAL) ×1 IMPLANT
GAUZE SPONGE 4X4 12PLY STRL (GAUZE/BANDAGES/DRESSINGS) IMPLANT
GAUZE XEROFORM 5X9 LF (GAUZE/BANDAGES/DRESSINGS) IMPLANT
GLOVE BIOGEL PI IND STRL 8 (GLOVE) ×1 IMPLANT
GLOVE BIOGEL PI INDICATOR 8 (GLOVE) ×1
GLOVE ECLIPSE 8.0 STRL XLNG CF (GLOVE) ×2 IMPLANT
GLOVE ORTHO TXT STRL SZ7.5 (GLOVE) ×2 IMPLANT
GOWN STRL REUS W/TWL XL LVL3 (GOWN DISPOSABLE) ×2 IMPLANT
KIT BASIN OR (CUSTOM PROCEDURE TRAY) ×2 IMPLANT
NS IRRIG 1000ML POUR BTL (IV SOLUTION) IMPLANT
PACK LOWER EXTREMITY WL (CUSTOM PROCEDURE TRAY) ×2 IMPLANT
PAD ABD 8X10 STRL (GAUZE/BANDAGES/DRESSINGS) ×2 IMPLANT
PAD CAST 4YDX4 CTTN HI CHSV (CAST SUPPLIES) IMPLANT
PADDING CAST COTTON 4X4 STRL (CAST SUPPLIES)
POSITIONER SURGICAL ARM (MISCELLANEOUS) ×2 IMPLANT
SPONGE LAP 18X18 X RAY DECT (DISPOSABLE) ×4 IMPLANT
STAPLER VISISTAT 35W (STAPLE) ×2 IMPLANT
STOCKINETTE 8 INCH (MISCELLANEOUS) ×2 IMPLANT
SUT ETHILON 2 0 PS N (SUTURE) ×2 IMPLANT
SUT ETHILON 2 0 PSLX (SUTURE) IMPLANT
TOWEL OR 17X26 10 PK STRL BLUE (TOWEL DISPOSABLE) ×2 IMPLANT
WATER STERILE IRR 1500ML POUR (IV SOLUTION) IMPLANT

## 2014-05-11 NOTE — Brief Op Note (Signed)
05/03/2014 - 05/11/2014  4:43 PM  PATIENT:  [name]  48 y.o. female  PRE-OPERATIVE DIAGNOSIS:  Left great toe osteomyelitis/infection  POST-OPERATIVE DIAGNOSIS: same  PROCEDURE:  Procedure(s): AMPUTATION LEFT GREAT TOE (Left)  SURGEON:  Surgeon(s) and Role:    * Kathryne Hitch, MD - Primary  PHYSICIAN ASSISTANT: Rexene Edison, PA-C  ANESTHESIA:   local, regional and IV sedation  EBL:  Total I/O In: 436 [IV Piggyback:436] Out: 400 [Urine:400]  BLOOD ADMINISTERED:none  DRAINS: none   LOCAL MEDICATIONS USED:  MARCAINE     SPECIMEN:  No Specimen  DISPOSITION OF SPECIMEN:  N/A  COUNTS:  YES  TOURNIQUET:    DICTATION: .Other Dictation: Dictation Number [id number]  PLAN OF CARE: Admit to inpatient   PATIENT DISPOSITION:  PACU - hemodynamically stable.   Delay start of Pharmacological VTE agent (>24hrs) due to surgical blood loss or risk of bleeding: no

## 2014-05-11 NOTE — Op Note (Signed)
NAMEELLAJANE, STONG              ACCOUNT NO.:  [id number]  MEDICAL RECORD NO.:  [id number]  LOCATION:  1533                         FACILITY:  [facility]  PHYSICIAN:  Vanita Panda. [name], M.D.DATE OF BIRTH:  1966-08-14  DATE OF PROCEDURE:  05/11/2014 DATE OF DISCHARGE:                              OPERATIVE REPORT   PREOPERATIVE DIAGNOSIS:  Acute osteomyelitis, left great toe distal phalanx.  POSTOPERATIVE DIAGNOSIS:  Acute osteomyelitis, left great toe distal phalanx.  PROCEDURE:  Left great toe amputation through interphalangeal joint.  SURGEON:  Vanita Panda. [name], M.D.  ASSISTANT:  Richardean Canal, P.A.  ANESTHESIA: 1. Left leg popliteal block. 2. Local digital block with 0.25% plain Marcaine in great toe. 3. Mask ventilation, IV sedation.  ESTIMATED BLOOD LOSS:  Less than 50 mL.  COMPLICATIONS:  None.  INDICATIONS:  Ms. Schaafsma is a 48 year old female who developed left toe pain and swelling and redness in her foot.  She had a wound on the end of her toe and x-rays were consistent with acute osteomyelitis with moth- eaten appearing bone.  She has been in the hospital on IV antibiotics and now she is cleared for surgery once her creatinine has been improving.  She understands the need to have the great toe removed to the IP joint given the infection.  Her cellulitis has resolved now, and again we are bringing her to the operating room for the aforementioned case.  Informed consent was obtained.  DESCRIPTION OF PROCEDURE:  After informed consent was obtained, the left leg popliteal block was obtained.  She was brought to the operating room, placed supine on the operating table.  Mask ventilation, IV sedation were obtained.  We prepped her left foot and ankle with DuraPrep and sterile drapes.  Time-out was called and she was identified as correct patient, correct left foot.  I then used the Esmarch around the ankle as a local tourniquet with a towel and then  provided digital block with 0.25% plain Marcaine around the great toe.  We then used a #15 blade, excised and ellipsed out the end of the great toe through the IP joint.  I used a rongeur to back the proximal phalanx up.  We let the Esmarch down and there was brisk bleeding noted which was good.  There was no gross purulence approximately.  Once we irrigated the soft tissues and the bone, we were able to reapproximate the skin with interrupted 2-0 nylon suture.  Xeroform and well-padded sterile dressing was applied.  She was taken to the recovery room in stable condition. Postoperatively, we will let her weight bear as tolerated in a postop shoe and will have her discharged tomorrow on oral antibiotics with followup in the office in 2 weeks.     Vanita Panda. [name], M.D.     CYB/MEDQ  D:  05/11/2014  T:  05/11/2014  Job:  [id number]

## 2014-05-11 NOTE — Progress Notes (Signed)
Nutrition Brief Note  Saw patient due to LOS.   Wt Readings from Last 15 Encounters:  05/11/14 227 lb 11.2 oz (103.284 kg)  05/11/14 227 lb 11.2 oz (103.284 kg)  05/11/14 227 lb 11.2 oz (103.284 kg)  04/21/14 223 lb 12.8 oz (101.515 kg)  03/23/14 222 lb 3.6 oz (100.8 kg)  08/05/13 217 lb (98.431 kg)  06/07/13 223 lb (101.152 kg)  03/07/13 240 lb 11.9 oz (109.2 kg)  01/06/13 236 lb 5.3 oz (107.2 kg)  10/24/12 242 lb (109.77 kg)  08/08/12 243 lb 4.8 oz (110.36 kg)  07/04/12 244 lb (110.678 kg)  02/05/12 243 lb 11.2 oz (110.542 kg)  11/20/11 260 lb (117.935 kg)  09/23/11 257 lb 15 oz (117 kg)    Body mass index is 34.63 kg/(m^2). Patient meets criteria for obesity grade 1 based on current BMI.   Current diet order is CHO MOD, patient is consuming approximately 75% of meals at this time. Labs and medications reviewed.   Noted patient is for amputation of left great toe secondary to osteomyelitis.  Patient states that she has been gaining weight and overall eating well.  No nutrition interventions warranted at this time. If nutrition issues arise, please consult RD.   Oran Rein, RD, LDN Clinical Inpatient Dietitian Pager:  [phone] Weekend and after hours pager:  [phone]

## 2014-05-11 NOTE — Progress Notes (Signed)
PROGRESS NOTE  [name] ZOX:[id number] DOB: [birth year] DOA: 05/03/2014 PCP: [name], MD  Assessment/Plan: left great toe osteomyelitis/left foot cellulitis:  -continue IV antibiotics (daptomycin and cefepime)  -plan is for left great toe amputation 05/11/14-->please send bone cultures  -Erythema has resolved -Plan to send the patient home on oral antibiotics after amputation  -defer decision on holding Lovenox tonight (after surgery) to orthopedics acute on chronic renal failure (stage 2 at baseline):  -patient with hx of renal transplant  -continue prednisone, cellcept and prograf (level pending)  -Creatinine plateau at 3.4; now improving slowly  -appreciate nephrology followup  -Vancomycin has been discontinue given renal failure and will continue daptomycin for now--check CK of daptomycin  -stress steroid was ordered prior to surgery-->anticipate elevation of blood sugar in the short term metabolic acidosis:  -due to diarrhea and worsening renal failure  -Continue symptomatic treatment of her diarrhea (patient stools are neg for c. Diff)  -continue bicarbonate tablets for now.  uncontrolled type 1 diabetes, with renal manifestations:  -hold am 05/11/14 dose of levemir as pt will be npo for surgery  -last A1C 11.5  -continue low carb diet  -had mild hypoglycemia 05/09/14  -Will decrease Levemir to 18 units twice a day and add meal coverage 4 units; continue SSI  HTN:  -stable and and with fair control since IVF's resuscitation.  -continue current medication regimen and adjust as needed if blood pressure continued to be elevated.  hypothyroidism:  -continue synthroid  abd pain and diarrhea:  -improved, tolerating diet  -normal KUB, neg c. Diff  -will continue PRN analgesics medications  -continue PPI  -Continue as needed Imodium.  -CMV and BK virus PCR are neg  Family Communication: Pt at beside  Disposition Plan: Home when medically stable and  cleared by consultants   Antibiotics:  Cubicin 05/05/2014>>>  Cefepime 05/04/2014>>>       Procedures/Studies: Dg Abd 1 View  05/04/2014   CLINICAL DATA:  Abdominal pain.  EXAM: ABDOMEN - 1 VIEW  COMPARISON:  March 23, 2014.  FINDINGS: The bowel gas pattern is normal. No radio-opaque calculi or other significant radiographic abnormality are seen.  IMPRESSION: No evidence of bowel obstruction or ileus.   Electronically Signed   By: Roque Lias M.D.   On: 05/04/2014 09:28   US Renal Transplant W/doppler  05/05/2014   CLINICAL DATA:  Acute renal failure. Patient has a transplant kidney in the right lower quadrant.  EXAM: ULTRASOUND OF RENAL TRANSPLANT WITH DOPPLER  TECHNIQUE: Ultrasound examination of the renal transplant was performed with gray-scale, color and duplex doppler evaluation.  COMPARISON:  03/05/2013.  FINDINGS: Transplant kidney location:  Right lower quadrant  Transplant kidney:  Length: 14.0. Normal in size and parenchymal echogenicity. No evidence of mass or hydronephrosis.  Color flow in the main renal artery:  Present  Color flow in the main renal vein:  Present  Duplex Doppler Evaluation  Resistive index in main renal artery: 0.8  Venous waveform in main renal vein:  present/absent  Resistive index in upper pole intrarenal artery: 0.76  Resistive index in lower pole intrarenal artery: 0.77  Bladder: Incompletely evaluated with a Foley balloon catheter in place.  Other findings:  No abnormal perinephric fluid collections.  IMPRESSION: 1. Transplant kidney is sonographically normal in appearance. Specifically, no hydronephrosis and no abnormal perinephric fluid collections. 2. Resistive indices, as above, similar compared to prior study 03/05/2013.   Electronically Signed   By:  Trudie Reed M.D.   On: 05/05/2014 11:56   Dg Foot Complete Left  04/20/2014   CLINICAL DATA:  CELLULITIS TOE PAIN  EXAM: LEFT FOOT - COMPLETE 3+ VIEW  COMPARISON:  None.  FINDINGS: No acute fracture or  dislocation. Joint spaces are maintained. No osseous erosions to suggest active osteomyelitis. Diffuse osteopenia present. Posterior and plantar calcaneal enthesophyte is noted. Degenerative spurring present at the dorsal talonavicular joint.  No soft tissue emphysema or other abnormality identified. Prominent vascular calcifications present.  IMPRESSION: No acute abnormality about the left foot.   Electronically Signed   By: Rise Mu M.D.   On: 04/20/2014 01:28   Dg Toe Great Left  05/03/2014   CLINICAL DATA:  Diabetic wound  EXAM: LEFT GREAT TOE  COMPARISON:  April 20, 2014.  FINDINGS: Frontal, oblique, and lateral views were obtained. There is subtle osteomyelitis along the distal most aspect of the first distal phalanx. No other bony destruction. There is evidence of an old fracture with remodeling involving the second proximal phalanx. There is narrowing of all PIP and DIP joints. There is extensive arterial vascular calcification. No acute fracture or dislocation.  IMPRESSION: Subtle osteomyelitis involving the distal most aspect of the first distal phalanx.   Electronically Signed   By: Bretta Bang M.D.   On: 05/03/2014 18:46   Dg Toe Great Left  04/20/2014   CLINICAL DATA:  CELLULITIS TOE PAIN  EXAM: LEFT GREAT TOE  COMPARISON:  None.  FINDINGS: No acute fracture or dislocation. No osseous erosions to suggest active osteomyelitis identified. Joint spaces are maintained. No soft tissue abnormality. No radiopaque foreign body. Prominent vascular calcifications noted.  IMPRESSION: 1. No radiographic evidence of active infection or osteomyelitis identified. 2. No acute fracture dislocation.   Electronically Signed   By: Rise Mu M.D.   On: 04/20/2014 01:19         Subjective: Patient denies fevers, chills, headache, chest pain, dyspnea, nausea, vomiting, diarrhea, abdominal pain, dysuria, hematuria   Objective: Filed Vitals:   05/10/14 0702 05/10/14 1400  05/10/14 2146 05/11/14 0610  BP:  152/74 174/96 164/80  Pulse:  80 78 83  Temp:  97.9 F (36.6 C) 98 F (36.7 C) 98 F (36.7 C)  TempSrc:  Oral Oral Oral  Resp:  Height:      Weight: 102.6 kg (226 lb 3.1 oz)   103.284 kg (227 lb 11.2 oz)  SpO2:  97% 97% 94%    Intake/Output Summary (Last 24 hours) at 05/11/14 1202 Last data filed at 05/11/14 1000  Gross per 24 hour  Intake    840 ml  Output   2900 ml  Net  -2060 ml   Weight change:  Exam:   General:  Pt is alert, follows commands appropriately, not in acute distress  HEENT: No icterus, No thrush,  [place]/AT  Cardiovascular: RRR, S1/S2, no rubs, no gallops  Respiratory: CTA bilaterally, no wheezing, no crackles, no rhonchi  Abdomen: Soft/+BS, non tender, non distended, no guarding  Extremities: No edema, No lymphangitis, No petechiae, No rashes, no synovitis; left hallux without any crepitance, drainage, erythema.  Data Reviewed: Basic Metabolic Panel:  Recent Labs Lab 05/07/14 0444 05/08/14 0450 05/09/14 0459 05/10/14 0435 05/11/14 0450  NA 136* 137 141 139  142 136*  K 4.5 4.5 4.2 4.6  4.6 4.9  CL 106 106 109 105  108 102  CO2 14* 18* GLUCOSE 396* 265* 103*  215*  219* 396*  BUN 29* 29* 28* 30*  29* 35*  CREATININE 3.47* 3.24* 3.03* 3.02*  2.99* 2.93*  CALCIUM 9.3 9.3 9.4 9.5  9.5 9.3  PHOS  --  3.2 3.4 3.4  --    Liver Function Tests:  Recent Labs Lab 05/08/14 0450 05/09/14 0459 05/10/14 0435  ALBUMIN 2.7* 2.6* 2.5*   No results found for this basename: LIPASE, AMYLASE,  in the last 168 hours No results found for this basename: AMMONIA,  in the last 168 hours CBC:  Recent Labs Lab 05/08/14 0450  WBC 5.4  HGB 12.5  HCT 38.8  MCV 91.3  PLT 145*   Cardiac Enzymes:  Recent Labs Lab 05/11/14 0450  CKTOTAL 31  CKMB PENDING   BNP: No components found with this basename: POCBNP,  CBG:  Recent Labs Lab 05/09/14 2219 05/10/14 0749 05/10/14 1248  05/10/14 1652 05/10/14 2131  GLUCAP 192* 266* 113* 194* 281*    Recent Results (from the past 240 hour(s))  CLOSTRIDIUM DIFFICILE BY PCR     Status: None   Collection Time    05/04/14  3:42 AM      Result Value Ref Range Status   C difficile by pcr NEGATIVE  NEGATIVE Final   Comment: Performed at [facility]  SURGICAL PCR SCREEN     Status: None   Collection Time    05/04/14  6:08 PM      Result Value Ref Range Status   MRSA, PCR NEGATIVE  NEGATIVE Final   Staphylococcus aureus NEGATIVE  NEGATIVE Final   Comment:            The Xpert SA Assay (FDA     approved for NASAL specimens     in patients over 81 years of age),     is one component of     a comprehensive surveillance     program.  Test performance has     been validated by [employer] for patients greater     than or equal to 14 year old.     It is not intended     to diagnose infection nor to     guide or monitor treatment.  URINE CULTURE     Status: None   Collection Time    05/09/14  4:00 PM      Result Value Ref Range Status   Specimen Description URINE, RANDOM   Final   Special Requests NONE   Final   Culture  Setup Time     Final   Value: 05/09/2014 23:06     Performed at [facility]   [place] Count     Final   Value: NO GROWTH     Performed at [facility]   Culture     Final   Value: NO GROWTH     Performed at [facility]   Report Status 05/10/2014 FINAL   Final     Scheduled Meds: . carvedilol  50 mg Oral BID WC  . ceFEPime (MAXIPIME) IV  1 g Intravenous Q24H  . DAPTOmycin (CUBICIN)  IV  600 mg Intravenous Q48H  . enoxaparin (LOVENOX) injection  30 mg Subcutaneous Q24H  . famotidine  20 mg Oral Daily  . insulin aspart  0-15 Units Subcutaneous Q4H  . insulin aspart  4 Units Subcutaneous TID WC  . insulin detemir  18 Units Subcutaneous BID  . isosorbide-hydrALAZINE  1 tablet Oral BID  .  levothyroxine  137 mcg Oral QAC breakfast  . methylPREDNISolone  (SOLU-MEDROL) injection  60 mg Intravenous Once  . mycophenolate  500 mg Oral BID  . pantoprazole  40 mg Oral Q1200  . predniSONE  5 mg Oral Daily  . sodium bicarbonate  1,300 mg Oral BID  . sodium chloride  3 mL Intravenous Q12H  . tacrolimus  1 mg Oral BID   Continuous Infusions:    Leroi Haque, DO  [facility] Pager [phone]  If 7PM-7AM, please contact night-coverage [email or web address] Password [email or web address] 05/11/2014, 12:02 PM   LOS: 8 days

## 2014-05-11 NOTE — Discharge Instructions (Signed)
You may put full weight on your left foot in your post-op shoe. Leave your current dressing on and in place for the next 4-5 days. In 4-5 days, you can remove your dressing and start placing a small, thin layer of triple antibiotic ointment on your incision daily and a dry band-aid daily. You can get your left foot wet in the shower starting in 5 days.

## 2014-05-11 NOTE — Anesthesia Procedure Notes (Signed)
Anesthesia Regional Block:  Popliteal block  Pre-Anesthetic Checklist: ,, timeout performed, Correct Patient, Correct Site, Correct Laterality, Correct Procedure, Correct Position, site marked, Risks and benefits discussed,  Surgical consent,  Pre-op evaluation,  At surgeon's request and post-op pain management  Laterality: Left and Lower  Prep: chloraprep       Needles:  Injection technique: Single-shot  Needle Type: Echogenic Stimulator Needle     Needle Length: 10cm 10 cm Needle Gauge: 21 and 21 G    Additional Needles:  Procedures: ultrasound guided (picture in chart) and nerve stimulator Popliteal block Narrative:  Injection made incrementally with aspirations every 5 mL.  Performed by: Personally  Anesthesiologist: Phillips Grout MD  Additional Notes: Patient tolerated the procedure well without complications

## 2014-05-11 NOTE — Progress Notes (Signed)
CARE MANAGEMENT NOTE 05/11/2014  Patient:  [name], [name]   Account Number:  [id number]  Date Initiated:  05/04/2014  Documentation initiated by:  Lorenda Ishihara  Subjective/Objective Assessment:   48 yo female admitted with osteo of foot. PTA lived at home with spouse.     Action/Plan:   Home when stable   Anticipated DC Date:  05/14/2014   Anticipated DC Plan:  HOME/SELF CARE         Choice offered to / List presented to:             Status of service:  In process, will continue to follow Medicare Important Message given?  YES (If response is "NO", the following Medicare IM given date fields will be blank) Date Medicare IM given:  05/08/2014 Medicare IM given by:  Ezekiel Ina Date Additional Medicare IM given:   Additional Medicare IM given by:    Discharge Disposition:    Per UR Regulation:  Reviewed for med. necessity/level of care/duration of stay  If discussed at Long Length of Stay Meetings, dates discussed:    Comments:  [id number]/Rhonda Davis,RN,BSN,CCM: To O.R. [id number] for amputation of the left great toe due to infection and continued osteomylitis.

## 2014-05-11 NOTE — Progress Notes (Signed)
ANTIBIOTIC CONSULT NOTE - Follow Up  Pharmacy Consult for Daptomycin, Cefepime Indication: Osteomyelitis  Allergies  Allergen Reactions  . Ace Inhibitors Other (See Comments)    Cough   ( Lisinopril )  . Adhesive [Tape] Itching    Patient Measurements: Height:  (172.7 cm) Weight: 227 lb 11.2 oz (103.284 kg) IBW/kg (Calculated) : 63.9   Vital Signs: Temp: 98 F (36.7 C) (09/04 0610) Temp src: Oral (09/04 0610) BP: 164/80 mmHg (09/04 0610) Pulse Rate: 83 (09/04 0610) Intake/Output from previous day: 09/03 0701 - 09/04 0700 In: 1203 [P.O.:1200; I.V.:3] Out: 2800 [Urine:2800]  Labs:  Recent Labs  05/09/14 0459 05/10/14 0435 05/11/14 0450  CREATININE 3.03* 3.02*  2.99* 2.93*   Estimated Creatinine Clearance: 29.9 ml/min (by C-G formula based on Cr of 2.93). No results found for this basename: Rolm Gala, VANCORANDOM, GENTTROUGH, GENTPEAK, GENTRANDOM, TOBRATROUGH, TOBRAPEAK, TOBRARND, AMIKACINPEAK, AMIKACINTROU, AMIKACIN,  in the last 72 hours    Assessment: 48yo F admitted 8/27 with h/o DM, renal failure s/p kidney transplant in 2011, toe amputation. C/o left big toe swelling, pain, redness x 3 days.  Pharmacy was initially consulted to dose Vancomycin and Cefepime, but Vancomycin changed to Daptomycin for rising SCr on 8/29.  Day #6 Cefepime 2g q24h and day #5 Dapto 600 mg q48h (~ 5.9 mg/kg) for osteomyelitis.  Amputation delayed due to fluid status, but cleared for OR today.  MD planning to send patient home on oral antibiotics.  8/27 >> zosyn x1 8/27 >>Vanco >> 8/29 8/27 >> Cefepime >> 8/29 >> Dapto >>  Tmax: afebrile WBCs: WNL Renal: SCr now improving, CrCl ~ 26N, ~ 30 (CG; ABW), continues to maintain good UOP suggesting recovery (0.8-1.2 ml/kg/hr)  8/28 MRSA PCR: negative 8/28 Cdiff PCR: negative 9/2 UCx: NGF 9/4 Bone Cx: awaiting collection  Goal of Therapy:  Appropriate abx dosing, eradication of infection.   Plan:  Will continue Abx  for now with plans to stop per MD once amputation satisfactorily completed.  Continue cefepime 1g IV q24h with current CrCl ~30  Continue Daptomycin 600 (6 mg/kg) IV q48h  Follow up renal function, bone Cx  Bernadene Person, PharmD Pager: [phone] 05/11/2014, 12:01 PM

## 2014-05-11 NOTE — Anesthesia Preprocedure Evaluation (Addendum)
Anesthesia Evaluation  Patient identified by MRN, date of birth, ID band Patient awake    Reviewed: Allergy & Precautions, H&P , NPO status , Patient's Chart, lab work & pertinent test results  Airway Mallampati: II TM Distance: >3 FB Neck ROM: Full    Dental no notable dental hx. (+) Poor Dentition, Missing   Pulmonary former smoker,  breath sounds clear to auscultation  Pulmonary exam normal       Cardiovascular hypertension, Pt. on medications Rhythm:Regular Rate:Normal     Neuro/Psych negative neurological ROS  negative psych ROS   GI/Hepatic negative GI ROS, Neg liver ROS,   Endo/Other  diabetes, Poorly Controlled, Type 1, Insulin Dependent  Renal/GU Renal InsufficiencyRenal diseaseS/p renal transplant  negative genitourinary   Musculoskeletal negative musculoskeletal ROS (+)   Abdominal   Peds negative pediatric ROS (+)  Hematology negative hematology ROS (+)   Anesthesia Other Findings   Reproductive/Obstetrics negative OB ROS                         Anesthesia Physical Anesthesia Plan  ASA: III  Anesthesia Plan: Regional   Post-op Pain Management:    Induction:   Airway Management Planned: Simple Face Mask  Additional Equipment:   Intra-op Plan:   Post-operative Plan:   Informed Consent: I have reviewed the patients History and Physical, chart, labs and discussed the procedure including the risks, benefits and alternatives for the proposed anesthesia with the patient or authorized representative who has indicated his/her understanding and acceptance.   Dental advisory given  Plan Discussed with: CRNA  Anesthesia Plan Comments: (Popliteal block)       Anesthesia Quick Evaluation

## 2014-05-11 NOTE — Transfer of Care (Signed)
Immediate Anesthesia Transfer of Care Note  Patient: [name]  Procedure(s) Performed: Procedure(s): AMPUTATION LEFT GREAT TOE (Left)  Patient Location: PACU  Anesthesia Type:MAC and Regional  Level of Consciousness: sedated and responds to stimulation  Airway & Oxygen Therapy: Patient Spontanous Breathing and Patient connected to face mask oxygen  Post-op Assessment: Report given to PACU RN and Post -op Vital signs reviewed and stable  Post vital signs: Reviewed and stable  Complications: No apparent anesthesia complications

## 2014-05-11 NOTE — Progress Notes (Signed)
Subjective: Interval History: none.  Objective: Vital signs in last 24 hours: Temp:  [97.9 F (36.6 C)-98 F (36.7 C)] 98 F (36.7 C) (09/04 0610) Pulse Rate:  [78-83] 83 (09/04 0610) Resp:  [18] 18 (09/04 0610) BP: (152-174)/(74-96) 164/80 mmHg (09/04 0610) SpO2:  [94 %-97 %] 94 % (09/04 0610) Weight:  [103.284 kg (227 lb 11.2 oz)] 103.284 kg (227 lb 11.2 oz) (09/04 0610) Weight change:   Intake/Output from previous day: 09/03 0701 - 09/04 0700 In: 1203 [P.O.:1200; I.V.:3] Out: 2800 [Urine:2800] Intake/Output this shift:    General appearance: alert, cooperative and moderately obese Resp: clear to auscultation bilaterally Cardio: S1, S2 normal and systolic murmur: holosystolic 2/6, blowing at apex GI: obese,pos bs, TX RLQ normal size and nontender,bruises lower abdm Extremities: eschar Great toe  Lab Results: No results found for this basename: WBC, HGB, HCT, PLT,  in the last 72 hours BMET:  Recent Labs  05/10/14 0435 05/11/14 0450  NA 139  142 136*  K 4.6  4.6 4.9  CL 105  108 102  CO2 GLUCOSE 215*  219* 396*  BUN 30*  29* 35*  CREATININE 3.02*  2.99* 2.93*  CALCIUM 9.5  9.5 9.3   No results found for this basename: PTH,  in the last 72 hours Iron Studies: No results found for this basename: IRON, TIBC, TRANSFERRIN, FERRITIN,  in the last 72 hours  Studies/Results: No results found.  I have reviewed the patient's current medications.  Assessment/Plan: 1 Renal Tx slow trend with improvement. Acid/base improving 2 HTN stable 3 Obesity 4 DM controlled 5 PVD with osteo per Ortho for OR, on AB P cont current meds, AB , follow Cr    LOS: 8 days   Amna Welker L 05/11/2014,10:50 AM

## 2014-05-12 LAB — RENAL FUNCTION PANEL
ANION GAP: 16 — AB (ref 5–15)
Albumin: 2.8 g/dL — ABNORMAL LOW (ref 3.5–5.2)
BUN: 46 mg/dL — ABNORMAL HIGH (ref 6–23)
CHLORIDE: 101 meq/L (ref 96–112)
CO2: 20 mEq/L (ref 19–32)
Calcium: 9.9 mg/dL (ref 8.4–10.5)
Creatinine, Ser: 2.46 mg/dL — ABNORMAL HIGH (ref 0.50–1.10)
GFR calc Af Amer: 26 mL/min — ABNORMAL LOW (ref >90)
GFR, EST NON AFRICAN AMERICAN: 22 mL/min — AB (ref >90)
GLUCOSE: 169 mg/dL — AB (ref 70–99)
Phosphorus: 2.8 mg/dL (ref 2.3–4.6)
Potassium: 4.5 mEq/L (ref 3.7–5.3)
SODIUM: 137 meq/L (ref 137–147)

## 2014-05-12 LAB — CBC
HCT: 37.2 % (ref 36.0–46.0)
Hemoglobin: 12.1 g/dL (ref 12.0–15.0)
MCH: 29.3 pg (ref 26.0–34.0)
MCHC: 32.5 g/dL (ref 30.0–36.0)
MCV: 90.1 fL (ref 78.0–100.0)
PLATELETS: 139 10*3/uL — AB (ref 150–400)
RBC: 4.13 MIL/uL (ref 3.87–5.11)
RDW: 13.2 % (ref 11.5–15.5)
WBC: 4.2 10*3/uL (ref 4.0–10.5)

## 2014-05-12 LAB — GLUCOSE, CAPILLARY
GLUCOSE-CAPILLARY: 490 mg/dL — AB (ref 70–99)
GLUCOSE-CAPILLARY: 387 mg/dL — AB (ref 70–99)
Glucose-Capillary: 260 mg/dL — ABNORMAL HIGH (ref 70–99)
Glucose-Capillary: 555 mg/dL (ref 70–99)

## 2014-05-12 MED ORDER — SODIUM BICARBONATE 650 MG PO TABS: 1300.0000 mg | ORAL_TABLET | Freq: Two times a day (BID) | ORAL | Status: DC

## 2014-05-12 MED ORDER — INSULIN ASPART 100 UNIT/ML ~~LOC~~ SOLN
15.0000 [IU] | Freq: Once | SUBCUTANEOUS | Status: AC
  Administered 2014-05-12: 15 [IU] via SUBCUTANEOUS

## 2014-05-12 MED ORDER — ISOSORB DINITRATE-HYDRALAZINE 20-37.5 MG PO TABS: 1.0000 | ORAL_TABLET | Freq: Two times a day (BID) | ORAL | Status: DC

## 2014-05-12 MED ORDER — DOXYCYCLINE HYCLATE 100 MG PO CAPS: 100.0000 mg | ORAL_CAPSULE | Freq: Two times a day (BID) | ORAL | Status: DC

## 2014-05-12 MED ORDER — HYDROCODONE-ACETAMINOPHEN 5-325 MG PO TABS
1.0000 | ORAL_TABLET | Freq: Once | ORAL | Status: AC
  Administered 2014-05-12: 1 via ORAL
  Filled 2014-05-12: qty 1

## 2014-05-12 MED ORDER — INSULIN ASPART 100 UNIT/ML ~~LOC~~ SOLN
0.0000 [IU] | SUBCUTANEOUS | Status: DC
  Administered 2014-05-12: 20 [IU] via SUBCUTANEOUS
  Administered 2014-05-12: 11 [IU] via SUBCUTANEOUS

## 2014-05-12 MED ORDER — HYDROCODONE-ACETAMINOPHEN 5-325 MG PO TABS: 1.0000 | ORAL_TABLET | ORAL | Status: DC | PRN

## 2014-05-12 MED ORDER — AMOXICILLIN-POT CLAVULANATE 500-125 MG PO TABS: 1.0000 | ORAL_TABLET | Freq: Two times a day (BID) | ORAL | Status: DC

## 2014-05-12 NOTE — Progress Notes (Signed)
Patient ID: [name], female   DOB: 1966/06/28, 48 y.o.   MRN: [id number] Doing well from Ortho standpoint.  Can discharge today per primary service.  Given follow-up instructions.

## 2014-05-12 NOTE — Anesthesia Postprocedure Evaluation (Signed)
  Anesthesia Post-op Note  Patient: [name]  Procedure(s) Performed: Procedure(s) (LRB): AMPUTATION LEFT GREAT TOE (Left)  Patient Location: PACU  Anesthesia Type: Regional  Level of Consciousness: awake and alert   Airway and Oxygen Therapy: Patient Spontanous Breathing  Post-op Pain: mild  Post-op Assessment: Post-op Vital signs reviewed, Patient's Cardiovascular Status Stable, Respiratory Function Stable, Patent Airway and No signs of Nausea or vomiting  Last Vitals:  Filed Vitals:   05/11/14 2045  BP: 182/83  Pulse: 86  Temp: 36.7 C  Resp: 18    Post-op Vital Signs: stable   Complications: No apparent anesthesia complications

## 2014-05-12 NOTE — Evaluation (Signed)
Physical Therapy Evaluation Patient Details Name: [name] MRN: [id number] DOB: 1965/10/13 Today's Date: 05/12/2014   History of Present Illness  L great toe amputation 2* osteomyelitis   Clinical Impression  Pt s/p L great toe amputation currently mobilizing with RW at supervision level and with min assist required on stairs.  Pt plans d/c home this date with family support and no immediate PT follow up needs.    Follow Up Recommendations No PT follow up    Equipment Recommendations  Rolling walker with 5" wheels    Recommendations for Other Services       Precautions / Restrictions Precautions Precautions: Fall Required Braces or Orthoses: Other Brace/Splint Other Brace/Splint: Post op shoe L foot Restrictions Weight Bearing Restrictions: No Other Position/Activity Restrictions: WBAT with post op shoe      Mobility  Bed Mobility Overal bed mobility: Modified Independent                Transfers Overall transfer level: Modified independent Equipment used: Rolling walker (2 wheeled)             General transfer comment: cues for use of UEs to self assist  Ambulation/Gait Ambulation/Gait assistance: Min guard;Supervision Ambulation Distance (Feet): 120 Feet (twice) Assistive device: Rolling walker (2 wheeled) Gait Pattern/deviations: Step-to pattern;Step-through pattern;Decreased step length - right;Decreased step length - left;Shuffle;Trunk flexed     General Gait Details: cues for posture, position from RW and stride length.  Discussed need for shoe on R foot to equalize LE length and increase stability (no shoe available at this time)  Stairs Stairs: Yes Stairs assistance: Min assist Stair Management: One rail Right;Step to pattern;Forwards;With crutches Number of Stairs: 10 General stair comments: cues for sequence and foot/crutch placement.  Wheelchair Mobility    Modified Rankin (Stroke Patients Only)       Balance                                             Pertinent Vitals/Pain Pain Assessment: 0-10 Pain Score: 7  Pain Location: L foot Pain Descriptors / Indicators: Throbbing Pain Intervention(s): Limited activity within patient's tolerance;Monitored during session;Patient requesting pain meds-RN notified    Home Living Family/patient expects to be discharged to:: Private residence Living Arrangements: Spouse/significant other;Children Available Help at Discharge: Family Type of Home: Apartment Home Access: Level entry     Home Layout: Two level Home Equipment: None      Prior Function Level of Independence: Independent               Hand Dominance        Extremity/Trunk Assessment   Upper Extremity Assessment: Overall WFL for tasks assessed           Lower Extremity Assessment: LLE deficits/detail   LLE Deficits / Details: L great toe amp, dressings and post op shoe in place  Cervical / Trunk Assessment: Normal  Communication   Communication: No difficulties  Cognition Arousal/Alertness: Awake/alert Behavior During Therapy: WFL for tasks assessed/performed Overall Cognitive Status: Within Functional Limits for tasks assessed                      General Comments      Exercises        Assessment/Plan    PT Assessment Patient needs continued PT services  PT Diagnosis Difficulty walking   PT Problem  List Decreased activity tolerance;Decreased mobility;Decreased balance;Decreased knowledge of use of DME;Decreased knowledge of precautions;Decreased safety awareness;Pain  PT Treatment Interventions DME instruction;Gait training;Stair training;Functional mobility training;Therapeutic activities;Patient/family education   PT Goals (Current goals can be found in the Care Plan section) Acute Rehab PT Goals Patient Stated Goal: Home today PT Goal Formulation: No goals set, d/c therapy (d/c home this date)    Frequency 7X/week   Barriers to  discharge        Co-evaluation               End of Session Equipment Utilized During Treatment: Gait belt Activity Tolerance: Patient tolerated treatment well Patient left: in bed;with call bell/phone within reach Nurse Communication: Mobility status         Time: 1610-9604 PT Time Calculation (min): 37 min   Charges:   PT Evaluation $Initial PT Evaluation Tier I: 1 Procedure PT Treatments $Gait Training: 8-22 mins $Therapeutic Activity: 8-22 mins   PT G Codes:          Ashleyann Shoun 05/12/2014, 3:38 PM

## 2014-05-12 NOTE — Discharge Summary (Signed)
Physician Discharge Summary  [name] ZOX:[id number] DOB: 02/10/1966 DOA: 05/03/2014  PCP: [name], MD  Admit date: 05/03/2014 Discharge date: 05/12/2014  Recommendations for Outpatient Follow-up:  1. Pt will need to follow up with PCP in 2 weeks post discharge 2. Please obtain BMP to evaluate electrolytes and kidney function 3. Please also check CBC to evaluate Hg and Hct levels  Discharge Diagnoses:  left great toe osteomyelitis/left foot cellulitis:  -continue IV antibiotics (daptomycin and cefepime) during hospitalization -left great toe amputation 05/11/14--Dr. Doneen Poisson -Erythema has resolved  -send the patient home on doxycycline and augmentin 500/125 x 4 more days which will complete 11 days of therapy acute on chronic renal failure (stage 2 at baseline):  -patient with hx of renal transplant  -continue prednisone, cellcept and prograf (level pending)  -Creatinine plateau at 3.4; now improving slowly  -appreciate nephrology followup  -Vancomycin has been discontinue given renal failure and will continue daptomycin for now--check CK of daptomycin--31  -Serum creatinine 2.46 on the day of discharge metabolic acidosis:  -due to diarrhea and worsening renal failure  -Continue symptomatic treatment of her diarrhea (patient stools are neg for c. Diff)  -continue bicarbonate tablets for now.  uncontrolled type 1 diabetes, with renal manifestations:  -Patient will restart her usual dose of 70/30 insulin after discharge  -last A1C 11.5  -continue low carb diet  -Will decrease Levemir to 18 units twice a day and add meal coverage 4 units; continue SSI during the hospitalization HTN:  -stable and and with fair control since IVF's resuscitation.  -continue current medication regimen and adjust as needed if blood pressure continued to be elevated.  -Continue amlodipine, carvedilol, BiDil hypothyroidism:  -continue synthroid  abd pain and diarrhea:    -improved, tolerating diet  -normal KUB, neg c. Diff  -will continue PRN analgesics medications  -continue PPI  -Continue as needed Imodium.  -CMV and BK virus PCR are neg  Family Communication: Pt at beside  Disposition Plan: Home when medically stable and cleared by consultants  Antibiotics:  Cubicin 05/05/2014>>>  Cefepime 05/04/2014>>>   Discharge Condition: stable  Disposition: home Follow-up Information   Follow up with Kathryne Hitch, MD. Schedule an appointment as soon as possible for a visit in 2 weeks.   Specialty:  Orthopedic Surgery   Contact information:   [address] [place] [address] [place] [address] [phone]       Follow up with COLADONATO,JOSEPH A, MD In 2 weeks.   Specialty:  Nephrology   Contact information:   [address] [place] [address] [phone]       Diet:carb modified Wt Readings from Last 3 Encounters:  05/11/14 103.284 kg (227 lb 11.2 oz)  05/11/14 103.284 kg (227 lb 11.2 oz)  05/11/14 103.284 kg (227 lb 11.2 oz)    History of present illness:  48 y.o. female with history of renal transplant, hypertension, hypothyroidism, diabetes mellitus type 1 who was recently admitted for cellulitis of the left foot and toe, presents to the ER with complaints of pain in the left toe, with open wound and mild drainage for last 2 days prior to admission. Admission complicated with acute renal failure (ATN).  nephrology was consulted. Dr. Darrick Penna saw and followed the patient. The patient was somewhat hypovolemic and was fluid resuscitated. Because of concerns for vancomycin this was discontinued. The patient was started on Cubicin. She was continued on cefepime. The patient's erythema on her foot improved. Orthopedics was consulted because plain radiographs of the left  foot revealed erosions of the distal phalanx of the left first digit. The patient underwent amputation of the distal phalanx on 05/11/2014. She remained clinically stable in the  postoperatively.  The patient's serum creatinine continued to improve. It was 2.46 on the day of discharge. She will followup with Dr. Arrie Aran 2-3 weeks.      Consultants: Renal--[facility]  Discharge Exam: Filed Vitals:   05/12/14 1321  BP: 149/85  Pulse: 74  Temp: 97.8 F (36.6 C)  Resp: 16   Filed Vitals:   05/12/14 0242 05/12/14 0510 05/12/14 0955 05/12/14 1321  BP: 168/80 161/76  149/85  Pulse: 87 77 73 74  Temp: 98.4 F (36.9 C) 98 F (36.7 C) 97.7 F (36.5 C) 97.8 F (36.6 C)  TempSrc: Oral Oral Oral Oral  Resp: Height:      Weight:      SpO2: 95% 93% 97% 96%   General: A&O x 3, NAD, pleasant, cooperative Cardiovascular: RRR, no rub, no gallop, no S3 Respiratory: CTAB, no wheeze, no rhonchi Abdomen:soft, nontender, nondistended, positive bowel sounds Extremities: trace LE edema, No lymphangitis, no petechiae  Discharge Instructions      Discharge Instructions   Diet - low sodium heart healthy    Complete by:  As directed      Increase activity slowly    Complete by:  As directed             Medication List         amLODipine 5 MG tablet  Commonly known as:  NORVASC  Take 1 tablet (5 mg total) by mouth daily.     amoxicillin-clavulanate 500-125 MG per tablet  Commonly known as:  AUGMENTIN  Take 1 tablet (500 mg total) by mouth 2 (two) times daily.     carvedilol 25 MG tablet  Commonly known as:  COREG  Take 2 tablets (50 mg total) by mouth 2 (two) times daily.     doxycycline 100 MG capsule  Commonly known as:  VIBRAMYCIN  Take 1 capsule (100 mg total) by mouth 2 (two) times daily. Take for 7 days then stop.     HYDROcodone-acetaminophen 5-325 MG per tablet  Commonly known as:  NORCO/VICODIN  Take 1-2 tablets by mouth every 4 (four) hours as needed for moderate pain.     insulin aspart protamine- aspart (70-30) 100 UNIT/ML injection  Commonly known as:  NOVOLOG MIX 70/30  Inject 0.25 mLs (25 Units total)  into the skin 2 (two) times daily with a meal.     isosorbide-hydrALAZINE 20-37.5 MG per tablet  Commonly known as:  BIDIL  Take 1 tablet by mouth 2 (two) times daily.     levothyroxine 137 MCG tablet  Commonly known as:  SYNTHROID, LEVOTHROID  Take 137 mcg by mouth daily. Name brand     multivitamin with minerals Tabs tablet  Take 1 tablet by mouth daily.     mycophenolate 250 MG capsule  Commonly known as:  CELLCEPT  Take 500 mg by mouth 2 (two) times daily.     predniSONE 5 MG tablet  Commonly known as:  DELTASONE  Take 5 mg by mouth daily.     ranitidine 150 MG capsule  Commonly known as:  ZANTAC  Take 150 mg by mouth daily.     sodium bicarbonate 650 MG tablet  Take 2 tablets (1,300 mg total) by mouth 2 (two) times daily.     tacrolimus 1 MG capsule  Commonly  known as:  PROGRAF  Take 1 mg by mouth 2 (two) times daily.         The results of significant diagnostics from this hospitalization (including imaging, microbiology, ancillary and laboratory) are listed below for reference.    Significant Diagnostic Studies: Dg Abd 1 View  05/04/2014   CLINICAL DATA:  Abdominal pain.  EXAM: ABDOMEN - 1 VIEW  COMPARISON:  March 23, 2014.  FINDINGS: The bowel gas pattern is normal. No radio-opaque calculi or other significant radiographic abnormality are seen.  IMPRESSION: No evidence of bowel obstruction or ileus.   Electronically Signed   By: Roque Lias M.D.   On: 05/04/2014 09:28   US Renal Transplant W/doppler  05/05/2014   CLINICAL DATA:  Acute renal failure. Patient has a transplant kidney in the right lower quadrant.  EXAM: ULTRASOUND OF RENAL TRANSPLANT WITH DOPPLER  TECHNIQUE: Ultrasound examination of the renal transplant was performed with gray-scale, color and duplex doppler evaluation.  COMPARISON:  03/05/2013.  FINDINGS: Transplant kidney location:  Right lower quadrant  Transplant kidney:  Length: 14.0. Normal in size and parenchymal echogenicity. No evidence of  mass or hydronephrosis.  Color flow in the main renal artery:  Present  Color flow in the main renal vein:  Present  Duplex Doppler Evaluation  Resistive index in main renal artery: 0.8  Venous waveform in main renal vein:  present/absent  Resistive index in upper pole intrarenal artery: 0.76  Resistive index in lower pole intrarenal artery: 0.77  Bladder: Incompletely evaluated with a Foley balloon catheter in place.  Other findings:  No abnormal perinephric fluid collections.  IMPRESSION: 1. Transplant kidney is sonographically normal in appearance. Specifically, no hydronephrosis and no abnormal perinephric fluid collections. 2. Resistive indices, as above, similar compared to prior study 03/05/2013.   Electronically Signed   By: Trudie Reed M.D.   On: 05/05/2014 11:56   Dg Foot Complete Left  04/20/2014   CLINICAL DATA:  CELLULITIS TOE PAIN  EXAM: LEFT FOOT - COMPLETE 3+ VIEW  COMPARISON:  None.  FINDINGS: No acute fracture or dislocation. Joint spaces are maintained. No osseous erosions to suggest active osteomyelitis. Diffuse osteopenia present. Posterior and plantar calcaneal enthesophyte is noted. Degenerative spurring present at the dorsal talonavicular joint.  No soft tissue emphysema or other abnormality identified. Prominent vascular calcifications present.  IMPRESSION: No acute abnormality about the left foot.   Electronically Signed   By: Rise Mu M.D.   On: 04/20/2014 01:28   Dg Toe Great Left  05/03/2014   CLINICAL DATA:  Diabetic wound  EXAM: LEFT GREAT TOE  COMPARISON:  April 20, 2014.  FINDINGS: Frontal, oblique, and lateral views were obtained. There is subtle osteomyelitis along the distal most aspect of the first distal phalanx. No other bony destruction. There is evidence of an old fracture with remodeling involving the second proximal phalanx. There is narrowing of all PIP and DIP joints. There is extensive arterial vascular calcification. No acute fracture or  dislocation.  IMPRESSION: Subtle osteomyelitis involving the distal most aspect of the first distal phalanx.   Electronically Signed   By: Bretta Bang M.D.   On: 05/03/2014 18:46   Dg Toe Great Left  04/20/2014   CLINICAL DATA:  CELLULITIS TOE PAIN  EXAM: LEFT GREAT TOE  COMPARISON:  None.  FINDINGS: No acute fracture or dislocation. No osseous erosions to suggest active osteomyelitis identified. Joint spaces are maintained. No soft tissue abnormality. No radiopaque foreign body. Prominent vascular calcifications noted.  IMPRESSION: 1. No radiographic evidence of active infection or osteomyelitis identified. 2. No acute fracture dislocation.   Electronically Signed   By: Rise Mu M.D.   On: 04/20/2014 01:19     Microbiology: Recent Results (from the past 240 hour(s))  CLOSTRIDIUM DIFFICILE BY PCR     Status: None   Collection Time    05/04/14  3:42 AM      Result Value Ref Range Status   C difficile by pcr NEGATIVE  NEGATIVE Final   Comment: Performed at [facility]  SURGICAL PCR SCREEN     Status: None   Collection Time    05/04/14  6:08 PM      Result Value Ref Range Status   MRSA, PCR NEGATIVE  NEGATIVE Final   Staphylococcus aureus NEGATIVE  NEGATIVE Final   Comment:            The Xpert SA Assay (FDA     approved for NASAL specimens     in patients over 78 years of age),     is one component of     a comprehensive surveillance     program.  Test performance has     been validated by [employer] for patients greater     than or equal to 28 year old.     It is not intended     to diagnose infection nor to     guide or monitor treatment.  URINE CULTURE     Status: None   Collection Time    05/09/14  4:00 PM      Result Value Ref Range Status   Specimen Description URINE, RANDOM   Final   Special Requests NONE   Final   Culture  Setup Time     Final   Value: 05/09/2014 23:06     Performed at [facility]   Colony Count     Final    Value: NO GROWTH     Performed at [facility]   Culture     Final   Value: NO GROWTH     Performed at [facility]   Report Status 05/10/2014 FINAL   Final     Labs: Basic Metabolic Panel:  Recent Labs Lab 05/08/14 0450 05/09/14 0459 05/10/14 0435 05/11/14 0450 05/12/14 0549  NA 137 141 139  142 136* 137  K 4.5 4.2 4.6  4.6 4.9 4.5  CL 106 109 105  108 102 101  CO2 18* GLUCOSE 265* 103* 215*  219* 396* 169*  BUN 29* 28* 30*  29* 35* 46*  CREATININE 3.24* 3.03* 3.02*  2.99* 2.93* 2.46*  CALCIUM 9.3 9.4 9.5  9.5 9.3 9.9  PHOS 3.2 3.4 3.4  --  2.8   Liver Function Tests:  Recent Labs Lab 05/08/14 0450 05/09/14 0459 05/10/14 0435 05/12/14 0549  ALBUMIN 2.7* 2.6* 2.5* 2.8*   No results found for this basename: LIPASE, AMYLASE,  in the last 168 hours No results found for this basename: AMMONIA,  in the last 168 hours CBC:  Recent Labs Lab 05/08/14 0450 05/12/14 0549  WBC 5.4 4.2  HGB 12.5 12.1  HCT 38.8 37.2  MCV 91.3 90.1  PLT 145* 139*   Cardiac Enzymes:  Recent Labs Lab 05/11/14 0450  CKTOTAL 31  CKMB PENDING   BNP: No components found with this basename: POCBNP,  CBG:  Recent Labs Lab 05/11/14 2344  05/12/14 0042 05/12/14 0134 05/12/14 0238 05/12/14 0405  GLUCAP 526* 555* 490* 387* 260*    Time coordinating discharge:  Greater than 30 minutes  Signed:  Elif Yonts, DO [facility] Pager: [phone] 05/12/2014, 2:23 PM

## 2014-05-12 NOTE — Progress Notes (Signed)
Subjective: Interval History: has complaints, concern of BS.  Objective: Vital signs in last 24 hours: Temp:  [97.6 F (36.4 C)-98.8 F (37.1 C)] 97.7 F (36.5 C) (09/05 0955) Pulse Rate:  [70-91] 73 (09/05 0955) Resp:  [15-18] 16 (09/05 0955) BP: (161-185)/(76-88) 161/76 mmHg (09/05 0510) SpO2:  [91 %-100 %] 97 % (09/05 0955) Weight change:   Intake/Output from previous day: 09/04 0701 - 09/05 0700 In: 2051 [P.O.:240; I.V.:1375; IV Piggyback:436] Out: 1550 [Urine:1550] Intake/Output this shift: Total I/O In: 240 [P.O.:240] Out: 0   General appearance: alert, cooperative, moderately obese and pale Resp: diminished breath sounds bilaterally Cardio: S1, S2 normal and systolic murmur: holosystolic 2/6, blowing at apex GI: obese, pos bs, Renal TX RLQ normal size and nontender, bruises from SQ hep Extremities: dressing L foot  Lab Results:  Recent Labs  05/12/14 0549  WBC 4.2  HGB 12.1  HCT 37.2  PLT 139*   BMET:  Recent Labs  05/11/14 0450 05/12/14 0549  NA 136* 137  K 4.9 4.5  CL 102 101  CO2 22 20  GLUCOSE 396* 169*  BUN 35* 46*  CREATININE 2.93* 2.46*  CALCIUM 9.3 9.9   No results found for this basename: PTH,  in the last 72 hours Iron Studies: No results found for this basename: IRON, TIBC, TRANSFERRIN, FERRITIN,  in the last 72 hours  Studies/Results: No results found.  I have reviewed the patient's current medications.  Assessment/Plan: 1 Renal Tx Cr cont to fall, acidosis stable,.  Will set up for labs next week in our office and f/u with Dr. Abel Presto in 2-3 wk 2 HTN stop ivf 3 DM ^ with preop steroid. Resume diet and regimen,SSI 4 Obesity 5 Osteo of foot per Ortho P set up for f/u, stop ivf. Will see as outpatient    LOS: 9 days   Shontez Sermon L 05/12/2014,10:14 AM

## 2014-05-12 NOTE — Progress Notes (Signed)
Notified [facility] DME for RW for home. [facility] will follow up with pt on delivery of RW to home. Isidoro Donning RN CCM Case Mgmt phone [phone]

## 2014-05-12 NOTE — Progress Notes (Signed)
Dr Ophelia Charter notified of drainage on pts dressing under toe area. States reinforce dressing and ok for discharge.

## 2014-05-15 ENCOUNTER — Encounter (HOSPITAL_COMMUNITY): Payer: Self-pay | Admitting: Orthopaedic Surgery

## 2014-05-15 LAB — GLUCOSE, CAPILLARY
Glucose-Capillary: 112 mg/dL — ABNORMAL HIGH (ref 70–99)
Glucose-Capillary: 358 mg/dL — ABNORMAL HIGH (ref 70–99)

## 2014-05-16 LAB — CK ISOENZYMES
CK TOTAL: 31 U/L (ref 7–177)
CK-BB: 0 %
CK-MB: 0 % (ref <5)
CK-MM: 100 % (ref 95–100)
Creatine Kinase-Total: 23 U/L — ABNORMAL LOW (ref 29–143)

## 2014-05-16 NOTE — Progress Notes (Signed)
Received call from nurse on unit that patient's husband was calling to say that walker had not yet been delivered since discharge.  Discharge was 05/12/13.  Called Lecretia with [facility] and relayed information and she will call [facility] office and have look into situation and will call husband and deliver walker today.  I then called Karima Carrell (husband [phone]) and explained that [facility] was made aware and they would contact him and make delivery of walker. Inquired about patient and he said she was getting around with crutches and following DC instructions.  He was appreciative of response to his concerns

## 2014-05-17 NOTE — Progress Notes (Signed)
05/17/14 11:15am Husband called again stating that walker was not delivered yesterday as there was a $12 delivery charge they could not pay.  I called [facility] and was told this was a co-pay, not a delivery charge, and could not be waived.  Called husband back and relayed the information and he said to cancel the walker.  He had told [facility] to cancel yesterday.

## 2014-05-17 NOTE — ED Provider Notes (Signed)
CSN: [id number]     Arrival date & time 05/03/14  1741 History   First MD Initiated Contact with Patient 05/03/14 1909     Chief Complaint  Patient presents with  . toe infection, diabetic      (Consider location/radiation/quality/duration/timing/severity/associated sxs/prior Treatment) HPI  [name] is a 48 y.o. female with history of renal transplant, hypertension, hypothyroidism, diabetes mellitus type 1 who was recently admitted for cellulitis of the left foot and toe presents to the ER with complaints of pain in the left to last 2 days. Denies any trauma or fall. Recently seen for same and placed on abx. Symptoms slowly progressing despite this. Hx of DM and previous toe amps. Patient denies any fever chills   Past Medical History  Diagnosis Date  . Hypertension   . Hypothyroid   . Depression   . 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 Kidney disease   . End stage renal disease     right arm AV graft, post transplant  . Diabetes mellitus     Insulin dependant   Past Surgical History  Procedure Laterality Date  . Back surgery  2001  . Tubal ligation    . Dg av dialysis graft declot or    . Nephrectomy transplanted organ    . Kidney transplant  December 16, 2009  . 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  . Cataract extraction w/ intraocular lens implant Right   . Amputation Left 05/11/2014    Procedure: AMPUTATION LEFT GREAT TOE;  Surgeon: Kathryne Hitch, MD;  Location: [facility];  Service: Orthopedics;  Laterality: Left;   Family History  Problem Relation Age of Onset  . Emphysema Mother   . Throat cancer Mother   . COPD Mother   . Cancer Mother   . Emphysema Father   . COPD Father   . Stroke Father   . ADD / ADHD Son    History  Substance Use Topics  . Smoking status: Former Smoker -- 1.00 packs/day for 11 years    Types: Cigarettes    Quit date: 09/21/1998  . Smokeless tobacco: Never Used  . Alcohol Use: Yes     Comment: occasional     OB History   Grav Para Term Preterm Abortions TAB SAB Ect Mult Living                 Review of Systems    Allergies  Ace inhibitors and Adhesive  Home Medications   Prior to Admission medications   Medication Sig Start Date End Date Taking? Authorizing Provider  amLODipine (NORVASC) 5 MG tablet Take 1 tablet (5 mg total) by mouth daily. 08/08/12  Yes Alison Murray, MD  carvedilol (COREG) 25 MG tablet Take 2 tablets (50 mg total) by mouth 2 (two) times daily. 08/08/12  Yes Alison Murray, MD  insulin aspart protamine- aspart (NOVOLOG MIX 70/30) (70-30) 100 UNIT/ML injection Inject 0.25 mLs (25 Units total) into the skin 2 (two) times daily with a meal. 03/23/14  Yes Rodolph Bong, MD  levothyroxine (SYNTHROID, LEVOTHROID) 137 MCG tablet Take 137 mcg by mouth daily. Name brand   Yes Historical Provider, MD  Multiple Vitamin (MULTIVITAMIN WITH MINERALS) TABS tablet Take 1 tablet by mouth daily.   Yes Historical Provider, MD  mycophenolate (CELLCEPT) 250 MG capsule Take 500 mg by mouth 2 (two) times daily.    Yes Historical Provider, MD  predniSONE (DELTASONE) 5 MG tablet Take  5 mg by mouth daily.     Yes Historical Provider, MD  ranitidine (ZANTAC) 150 MG capsule Take 150 mg by mouth daily.    Yes Historical Provider, MD  tacrolimus (PROGRAF) 1 MG capsule Take 1 mg by mouth 2 (two) times daily.    Yes Historical Provider, MD  amoxicillin-clavulanate (AUGMENTIN) 500-125 MG per tablet Take 1 tablet (500 mg total) by mouth 2 (two) times daily. 05/12/14   Catarina Hartshorn, MD  doxycycline (VIBRAMYCIN) 100 MG capsule Take 1 capsule (100 mg total) by mouth 2 (two) times daily. 05/12/14   Catarina Hartshorn, MD  HYDROcodone-acetaminophen (NORCO/VICODIN) 5-325 MG per tablet Take 1-2 tablets by mouth every 4 (four) hours as needed for moderate pain. 05/12/14   Catarina Hartshorn, MD  isosorbide-hydrALAZINE (BIDIL) 20-37.5 MG per tablet Take 1 tablet by mouth 2 (two) times daily. 05/12/14   Catarina Hartshorn, MD  sodium bicarbonate 650  MG tablet Take 2 tablets (1,300 mg total) by mouth 2 (two) times daily. 05/12/14   Catarina Hartshorn, MD   BP 149/85  Pulse 74  Temp(Src) 97.8 F (36.6 C) (Oral)  Resp 16  Ht  (1.727 m)  Wt 227 lb 11.2 oz (103.284 kg)  BMI 34.63 kg/m2  SpO2 96%  LMP 11/19/2011 Physical Exam  Nursing note and vitals reviewed. Constitutional: She appears well-developed and well-nourished. No distress.  HENT:  Head: Normocephalic and atraumatic.  Eyes: Conjunctivae are normal. Right eye exhibits no discharge. Left eye exhibits no discharge.  Neck: Neck supple.  Cardiovascular: Normal rate, regular rhythm and normal heart sounds.  Exam reveals no gallop and no friction rub.   No murmur heard. Pulmonary/Chest: Effort normal and breath sounds normal. No respiratory distress.  Abdominal: Soft. She exhibits no distension. There is no tenderness.  Musculoskeletal:       Feet:  Ulceration to distal aspect of L toe. Mild swelling/erythema.   Neurological: She is alert.  Skin: Skin is warm and dry.  Psychiatric: She has a normal mood and affect. Her behavior is normal. Thought content normal.    ED Course  Procedures (including critical care time) Labs Review Labs Reviewed  COMPREHENSIVE METABOLIC PANEL - Abnormal; Notable for the following:    Sodium 135 (*)    Glucose, Bld 286 (*)    BUN 32 (*)    Creatinine, Ser 1.24 (*)    GFR calc non Af Amer 51 (*)    GFR calc Af Amer 59 (*)    All other components within normal limits  COMPREHENSIVE METABOLIC PANEL - Abnormal; Notable for the following:    Sodium 134 (*)    CO2 18 (*)    Glucose, Bld 336 (*)    BUN 34 (*)    Creatinine, Ser 1.72 (*)    Albumin 2.8 (*)    GFR calc non Af Amer 34 (*)    GFR calc Af Amer 40 (*)    All other components within normal limits  CBC WITH DIFFERENTIAL - Abnormal; Notable for the following:    Neutrophils Relative % 88 (*)    Lymphocytes Relative 2 (*)    Lymphs Abs 0.2 (*)    All other components within normal  limits  GLUCOSE, CAPILLARY - Abnormal; Notable for the following:    Glucose-Capillary 226 (*)    All other components within normal limits  LIPASE, BLOOD - Abnormal; Notable for the following:    Lipase 8 (*)    All other components within normal limits  GLUCOSE,  CAPILLARY - Abnormal; Notable for the following:    Glucose-Capillary 381 (*)    All other components within normal limits  BASIC METABOLIC PANEL - Abnormal; Notable for the following:    BUN 36 (*)    Creatinine, Ser 2.59 (*)    GFR calc non Af Amer 21 (*)    GFR calc Af Amer 24 (*)    All other components within normal limits  BASIC METABOLIC PANEL - Abnormal; Notable for the following:    Glucose, Bld 144 (*)    BUN 35 (*)    Creatinine, Ser 3.18 (*)    GFR calc non Af Amer 16 (*)    GFR calc Af Amer 19 (*)    All other components within normal limits  GLUCOSE, CAPILLARY - Abnormal; Notable for the following:    Glucose-Capillary 115 (*)    All other components within normal limits  GLUCOSE, CAPILLARY - Abnormal; Notable for the following:    Glucose-Capillary 57 (*)    All other components within normal limits  GLUCOSE, CAPILLARY - Abnormal; Notable for the following:    Glucose-Capillary 42 (*)    All other components within normal limits  GLUCOSE, CAPILLARY - Abnormal; Notable for the following:    Glucose-Capillary 49 (*)    All other components within normal limits  GLUCOSE, CAPILLARY - Abnormal; Notable for the following:    Glucose-Capillary 117 (*)    All other components within normal limits  GLUCOSE, CAPILLARY - Abnormal; Notable for the following:    Glucose-Capillary 104 (*)    All other components within normal limits  URINALYSIS, ROUTINE W REFLEX MICROSCOPIC - Abnormal; Notable for the following:    APPearance CLOUDY (*)    Protein, ur 30 (*)    All other components within normal limits  GLUCOSE, CAPILLARY - Abnormal; Notable for the following:    Glucose-Capillary 147 (*)    All other  components within normal limits  BASIC METABOLIC PANEL - Abnormal; Notable for the following:    CO2 16 (*)    Glucose, Bld 124 (*)    BUN 32 (*)    Creatinine, Ser 3.27 (*)    GFR calc non Af Amer 16 (*)    GFR calc Af Amer 18 (*)    All other components within normal limits  URINE MICROSCOPIC-ADD ON - Abnormal; Notable for the following:    Squamous Epithelial / LPF FEW (*)    Bacteria, UA MANY (*)    All other components within normal limits  GLUCOSE, CAPILLARY - Abnormal; Notable for the following:    Glucose-Capillary 43 (*)    All other components within normal limits  GLUCOSE, CAPILLARY - Abnormal; Notable for the following:    Glucose-Capillary 47 (*)    All other components within normal limits  GLUCOSE, CAPILLARY - Abnormal; Notable for the following:    Glucose-Capillary 50 (*)    All other components within normal limits  GLUCOSE, CAPILLARY - Abnormal; Notable for the following:    Glucose-Capillary 131 (*)    All other components within normal limits  GLUCOSE, CAPILLARY - Abnormal; Notable for the following:    Glucose-Capillary 159 (*)    All other components within normal limits  GLUCOSE, CAPILLARY - Abnormal; Notable for the following:    Glucose-Capillary 128 (*)    All other components within normal limits  BASIC METABOLIC PANEL - Abnormal; Notable for the following:    Sodium 136 (*)    CO2 14 (*)  Glucose, Bld 200 (*)    BUN 28 (*)    Creatinine, Ser 3.47 (*)    GFR calc non Af Amer 15 (*)    GFR calc Af Amer 17 (*)    All other components within normal limits  GLUCOSE, CAPILLARY - Abnormal; Notable for the following:    Glucose-Capillary 178 (*)    All other components within normal limits  GLUCOSE, CAPILLARY - Abnormal; Notable for the following:    Glucose-Capillary 224 (*)    All other components within normal limits  BASIC METABOLIC PANEL - Abnormal; Notable for the following:    Sodium 136 (*)    CO2 14 (*)    Glucose, Bld 396 (*)    BUN  29 (*)    Creatinine, Ser 3.47 (*)    GFR calc non Af Amer 15 (*)    GFR calc Af Amer 17 (*)    Anion gap 16 (*)    All other components within normal limits  GLUCOSE, CAPILLARY - Abnormal; Notable for the following:    Glucose-Capillary 270 (*)    All other components within normal limits  GLUCOSE, CAPILLARY - Abnormal; Notable for the following:    Glucose-Capillary 238 (*)    All other components within normal limits  GLUCOSE, CAPILLARY - Abnormal; Notable for the following:    Glucose-Capillary 427 (*)    All other components within normal limits  GLUCOSE, CAPILLARY - Abnormal; Notable for the following:    Glucose-Capillary 301 (*)    All other components within normal limits  CBC - Abnormal; Notable for the following:    Platelets 145 (*)    All other components within normal limits  RENAL FUNCTION PANEL - Abnormal; Notable for the following:    CO2 18 (*)    Glucose, Bld 265 (*)    BUN 29 (*)    Creatinine, Ser 3.24 (*)    Albumin 2.7 (*)    GFR calc non Af Amer 16 (*)    GFR calc Af Amer 18 (*)    All other components within normal limits  GLUCOSE, CAPILLARY - Abnormal; Notable for the following:    Glucose-Capillary 277 (*)    All other components within normal limits  GLUCOSE, CAPILLARY - Abnormal; Notable for the following:    Glucose-Capillary 253 (*)    All other components within normal limits  GLUCOSE, CAPILLARY - Abnormal; Notable for the following:    Glucose-Capillary 217 (*)    All other components within normal limits  GLUCOSE, CAPILLARY - Abnormal; Notable for the following:    Glucose-Capillary 147 (*)    All other components within normal limits  GLUCOSE, CAPILLARY - Abnormal; Notable for the following:    Glucose-Capillary 176 (*)    All other components within normal limits  RENAL FUNCTION PANEL - Abnormal; Notable for the following:    Glucose, Bld 103 (*)    BUN 28 (*)    Creatinine, Ser 3.03 (*)    Albumin 2.6 (*)    GFR calc non Af Amer  17 (*)    GFR calc Af Amer 20 (*)    All other components within normal limits  GLUCOSE, CAPILLARY - Abnormal; Notable for the following:    Glucose-Capillary 58 (*)    All other components within normal limits  URINALYSIS, ROUTINE W REFLEX MICROSCOPIC - Abnormal; Notable for the following:    APPearance CLOUDY (*)    Hgb urine dipstick LARGE (*)    Protein, ur  30 (*)    All other components within normal limits  GLUCOSE, CAPILLARY - Abnormal; Notable for the following:    Glucose-Capillary 65 (*)    All other components within normal limits  URINE MICROSCOPIC-ADD ON - Abnormal; Notable for the following:    Bacteria, UA FEW (*)    All other components within normal limits  GLUCOSE, CAPILLARY - Abnormal; Notable for the following:    Glucose-Capillary 225 (*)    All other components within normal limits  BASIC METABOLIC PANEL - Abnormal; Notable for the following:    Glucose, Bld 215 (*)    BUN 30 (*)    Creatinine, Ser 3.02 (*)    GFR calc non Af Amer 17 (*)    GFR calc Af Amer 20 (*)    All other components within normal limits  RENAL FUNCTION PANEL - Abnormal; Notable for the following:    Glucose, Bld 219 (*)    BUN 29 (*)    Creatinine, Ser 2.99 (*)    Albumin 2.5 (*)    GFR calc non Af Amer 18 (*)    GFR calc Af Amer 20 (*)    All other components within normal limits  GLUCOSE, CAPILLARY - Abnormal; Notable for the following:    Glucose-Capillary 192 (*)    All other components within normal limits  GLUCOSE, CAPILLARY - Abnormal; Notable for the following:    Glucose-Capillary 266 (*)    All other components within normal limits  GLUCOSE, CAPILLARY - Abnormal; Notable for the following:    Glucose-Capillary 113 (*)    All other components within normal limits  BASIC METABOLIC PANEL - Abnormal; Notable for the following:    Sodium 136 (*)    Glucose, Bld 396 (*)    BUN 35 (*)    Creatinine, Ser 2.93 (*)    GFR calc non Af Amer 18 (*)    GFR calc Af Amer 21 (*)      All other components within normal limits  CK ISOENZYMES - Abnormal; Notable for the following:    Creatine Kinase-Total 23 (*)    All other components within normal limits  GLUCOSE, CAPILLARY - Abnormal; Notable for the following:    Glucose-Capillary 194 (*)    All other components within normal limits  GLUCOSE, CAPILLARY - Abnormal; Notable for the following:    Glucose-Capillary 281 (*)    All other components within normal limits  GLUCOSE, CAPILLARY - Abnormal; Notable for the following:    Glucose-Capillary 331 (*)    All other components within normal limits  GLUCOSE, CAPILLARY - Abnormal; Notable for the following:    Glucose-Capillary 223 (*)    All other components within normal limits  GLUCOSE, CAPILLARY - Abnormal; Notable for the following:    Glucose-Capillary 175 (*)    All other components within normal limits  CBC - Abnormal; Notable for the following:    Platelets 139 (*)    All other components within normal limits  RENAL FUNCTION PANEL - Abnormal; Notable for the following:    Glucose, Bld 169 (*)    BUN 46 (*)    Creatinine, Ser 2.46 (*)    Albumin 2.8 (*)    GFR calc non Af Amer 22 (*)    GFR calc Af Amer 26 (*)    Anion gap 16 (*)    All other components within normal limits  GLUCOSE, CAPILLARY - Abnormal; Notable for the following:    Glucose-Capillary 161 (*)  All other components within normal limits  GLUCOSE, CAPILLARY - Abnormal; Notable for the following:    Glucose-Capillary 483 (*)    All other components within normal limits  GLUCOSE, CAPILLARY - Abnormal; Notable for the following:    Glucose-Capillary 526 (*)    All other components within normal limits  GLUCOSE, CAPILLARY - Abnormal; Notable for the following:    Glucose-Capillary 555 (*)    All other components within normal limits  GLUCOSE, CAPILLARY - Abnormal; Notable for the following:    Glucose-Capillary 490 (*)    All other components within normal limits  GLUCOSE,  CAPILLARY - Abnormal; Notable for the following:    Glucose-Capillary 387 (*)    All other components within normal limits  GLUCOSE, CAPILLARY - Abnormal; Notable for the following:    Glucose-Capillary 260 (*)    All other components within normal limits  GLUCOSE, CAPILLARY - Abnormal; Notable for the following:    Glucose-Capillary 358 (*)    All other components within normal limits  GLUCOSE, CAPILLARY - Abnormal; Notable for the following:    Glucose-Capillary 112 (*)    All other components within normal limits  CBG MONITORING, ED - Abnormal; Notable for the following:    Glucose-Capillary 274 (*)    All other components within normal limits  CLOSTRIDIUM DIFFICILE BY PCR  SURGICAL PCR SCREEN  URINE CULTURE  CBC  PREGNANCY, URINE  SEDIMENTATION RATE  GLUCOSE, CAPILLARY  SODIUM, URINE, RANDOM  CREATININE, URINE, RANDOM  BK VIRUS PCR  CMV (CYTOMEGALOVIRUS) DNA ULTRAQUANT, PCR  GI PATHOGEN PANEL BY PCR, STOOL  TACROLIMUS LEVEL  GLUCOSE, CAPILLARY  GLUCOSE, CAPILLARY  GLUCOSE, CAPILLARY    Imaging Review No results found.  f bowel obstruction or ileus.   Electronically Signed   By: Roque Lias M.D.   On: 05/04/2014 09:28   Dg Toe Great Left  05/03/2014   CLINICAL DATA:  Diabetic wound  EXAM: LEFT GREAT TOE  COMPARISON:  April 20, 2014.  FINDINGS: Frontal, oblique, and lateral views were obtained. There is subtle osteomyelitis along the distal most aspect of the first distal phalanx. No other bony destruction. There is evidence of an old fracture with remodeling involving the second proximal phalanx. There is narrowing of all PIP and DIP joints. There is extensive arterial vascular calcification. No acute fracture or dislocation.  IMPRESSION: Subtle osteomyelitis involving the distal most aspect of the first distal phalanx.   Electronically Signed   By: Bretta Bang M.D.   On: 05/03/2014 18:46    EKG Interpretation None      MDM   Final diagnoses:  Osteomyelitis  of toe of left foot    48yF with ulceration of L great toe. Now with radiographic evidence fo osteomyelitis despite abx. Admit.    Raeford Razor, MD 05/17/14 [phone]

## 2014-05-23 IMAGING — CR DG CHEST 1V PORT
1 series · 1 of 1 positions shown · non-contrast
Comparison: 11/20/2011 and prior chest radiographs dating back to
08/04/2003

CLINICAL DATA: 46-year-old female with chest pain and leukocytosis.

PORTABLE CHEST - 1 VIEW

[AP]
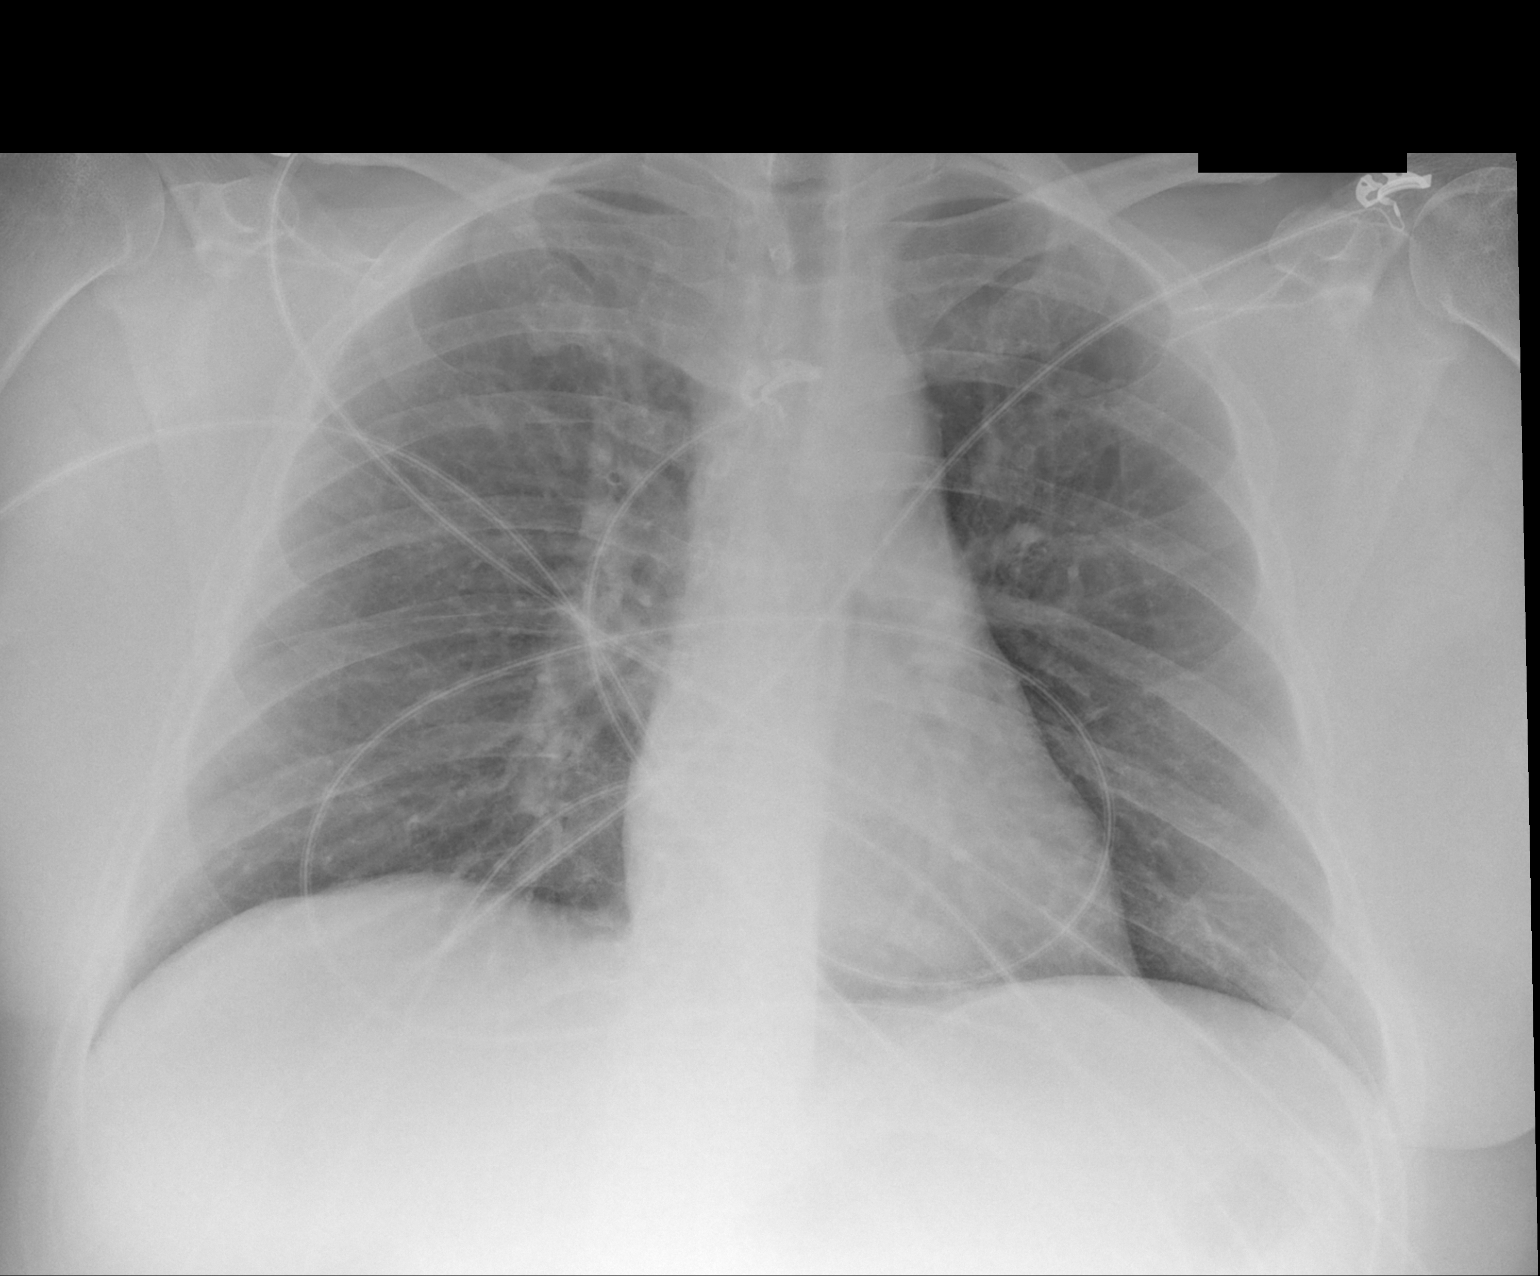

[1 of 1 positions shown; findings below may reference images not displayed]

FINDINGS: The cardiomediastinal silhouette is unremarkable.

There is no evidence of focal airspace disease, pulmonary edema,
suspicious pulmonary nodule/mass, pleural effusion, or
pneumothorax.
No acute bony abnormalities are identified.
IMPRESSION: No evidence of active cardiopulmonary disease.

## 2014-06-07 ENCOUNTER — Encounter (HOSPITAL_COMMUNITY): Payer: Self-pay | Admitting: Emergency Medicine

## 2014-06-07 ENCOUNTER — Inpatient Hospital Stay (HOSPITAL_COMMUNITY)
Admission: EM | Admit: 2014-06-07 | Discharge: 2014-06-10 | DRG: 637 | Disposition: A | Discharge status: Home / Self Care | Payer: Medicare Other | Attending: Internal Medicine | Admitting: Internal Medicine

## 2014-06-07 DIAGNOSIS — E1110 Type 2 diabetes mellitus with ketoacidosis without coma: Secondary | ICD-10-CM | POA: Diagnosis present

## 2014-06-07 DIAGNOSIS — Z940 Kidney transplant status: Secondary | ICD-10-CM

## 2014-06-07 DIAGNOSIS — E038 Other specified hypothyroidism: Secondary | ICD-10-CM

## 2014-06-07 DIAGNOSIS — N182 Chronic kidney disease, stage 2 (mild): Secondary | ICD-10-CM | POA: Diagnosis not present

## 2014-06-07 DIAGNOSIS — D899 Disorder involving the immune mechanism, unspecified: Secondary | ICD-10-CM

## 2014-06-07 DIAGNOSIS — Z89412 Acquired absence of left great toe: Secondary | ICD-10-CM

## 2014-06-07 DIAGNOSIS — E039 Hypothyroidism, unspecified: Secondary | ICD-10-CM | POA: Diagnosis present

## 2014-06-07 DIAGNOSIS — T383X6A Underdosing of insulin and oral hypoglycemic [antidiabetic] drugs, initial encounter: Secondary | ICD-10-CM | POA: Diagnosis present

## 2014-06-07 DIAGNOSIS — I10 Essential (primary) hypertension: Secondary | ICD-10-CM | POA: Diagnosis present

## 2014-06-07 DIAGNOSIS — I129 Hypertensive chronic kidney disease with stage 1 through stage 4 chronic kidney disease, or unspecified chronic kidney disease: Secondary | ICD-10-CM | POA: Diagnosis not present

## 2014-06-07 DIAGNOSIS — E871 Hypo-osmolality and hyponatremia: Secondary | ICD-10-CM | POA: Diagnosis present

## 2014-06-07 DIAGNOSIS — Z6831 Body mass index (BMI) 31.0-31.9, adult: Secondary | ICD-10-CM

## 2014-06-07 DIAGNOSIS — E875 Hyperkalemia: Secondary | ICD-10-CM | POA: Diagnosis present

## 2014-06-07 DIAGNOSIS — Z794 Long term (current) use of insulin: Secondary | ICD-10-CM

## 2014-06-07 DIAGNOSIS — E43 Unspecified severe protein-calorie malnutrition: Secondary | ICD-10-CM | POA: Diagnosis not present

## 2014-06-07 DIAGNOSIS — E1010 Type 1 diabetes mellitus with ketoacidosis without coma: Principal | ICD-10-CM | POA: Diagnosis present

## 2014-06-07 DIAGNOSIS — E1022 Type 1 diabetes mellitus with diabetic chronic kidney disease: Secondary | ICD-10-CM | POA: Diagnosis present

## 2014-06-07 DIAGNOSIS — E8729 Other acidosis: Secondary | ICD-10-CM

## 2014-06-07 DIAGNOSIS — N186 End stage renal disease: Secondary | ICD-10-CM

## 2014-06-07 DIAGNOSIS — Z91120 Patient's intentional underdosing of medication regimen due to financial hardship: Secondary | ICD-10-CM | POA: Diagnosis present

## 2014-06-07 DIAGNOSIS — R739 Hyperglycemia, unspecified: Secondary | ICD-10-CM

## 2014-06-07 DIAGNOSIS — Y929 Unspecified place or not applicable: Secondary | ICD-10-CM | POA: Diagnosis not present

## 2014-06-07 DIAGNOSIS — Z87891 Personal history of nicotine dependence: Secondary | ICD-10-CM

## 2014-06-07 DIAGNOSIS — N189 Chronic kidney disease, unspecified: Secondary | ICD-10-CM

## 2014-06-07 DIAGNOSIS — Z7952 Long term (current) use of systemic steroids: Secondary | ICD-10-CM | POA: Diagnosis not present

## 2014-06-07 DIAGNOSIS — R197 Diarrhea, unspecified: Secondary | ICD-10-CM

## 2014-06-07 DIAGNOSIS — D72829 Elevated white blood cell count, unspecified: Secondary | ICD-10-CM

## 2014-06-07 DIAGNOSIS — N179 Acute kidney failure, unspecified: Secondary | ICD-10-CM

## 2014-06-07 DIAGNOSIS — M869 Osteomyelitis, unspecified: Secondary | ICD-10-CM

## 2014-06-07 DIAGNOSIS — D849 Immunodeficiency, unspecified: Secondary | ICD-10-CM

## 2014-06-07 DIAGNOSIS — E872 Acidosis: Secondary | ICD-10-CM

## 2014-06-07 DIAGNOSIS — L03116 Cellulitis of left lower limb: Secondary | ICD-10-CM

## 2014-06-07 DIAGNOSIS — E860 Dehydration: Secondary | ICD-10-CM

## 2014-06-07 LAB — CBC WITH DIFFERENTIAL/PLATELET
Basophils Absolute: 0 10*3/uL (ref 0.0–0.1)
Basophils Relative: 1 % (ref 0–1)
EOS PCT: 0 % (ref 0–5)
Eosinophils Absolute: 0 10*3/uL (ref 0.0–0.7)
HCT: 42.4 % (ref 36.0–46.0)
HEMOGLOBIN: 13.5 g/dL (ref 12.0–15.0)
LYMPHS ABS: 1.4 10*3/uL (ref 0.7–4.0)
Lymphocytes Relative: 23 % (ref 12–46)
MCH: 29.1 pg (ref 26.0–34.0)
MCHC: 31.8 g/dL (ref 30.0–36.0)
MCV: 91.4 fL (ref 78.0–100.0)
MONOS PCT: 6 % (ref 3–12)
Monocytes Absolute: 0.4 10*3/uL (ref 0.1–1.0)
Neutro Abs: 4.2 10*3/uL (ref 1.7–7.7)
Neutrophils Relative %: 70 % (ref 43–77)
Platelets: 169 10*3/uL (ref 150–400)
RBC: 4.64 MIL/uL (ref 3.87–5.11)
RDW: 12.8 % (ref 11.5–15.5)
WBC: 6 10*3/uL (ref 4.0–10.5)

## 2014-06-07 LAB — BLOOD GAS, VENOUS
ACID-BASE DEFICIT: 20.3 mmol/L — AB (ref 0.0–2.0)
Bicarbonate: 7.3 mEq/L — ABNORMAL LOW (ref 20.0–24.0)
FIO2: 0.21 %
O2 SAT: 78.3 %
PATIENT TEMPERATURE: 98.3
TCO2: 6.9 mmol/L (ref 0–100)
pCO2, Ven: 21.1 mmHg — ABNORMAL LOW (ref 45.0–50.0)
pH, Ven: 7.163 — CL (ref 7.250–7.300)
pO2, Ven: 49.4 mmHg — ABNORMAL HIGH (ref 30.0–45.0)

## 2014-06-07 LAB — URINALYSIS, ROUTINE W REFLEX MICROSCOPIC
Bilirubin Urine: NEGATIVE
Hgb urine dipstick: NEGATIVE
Ketones, ur: >80 mg/dL — AB
Leukocytes, UA: NEGATIVE
Nitrite: NEGATIVE
Protein, ur: NEGATIVE mg/dL
Specific Gravity, Urine: 1.025 (ref 1.005–1.030)
Urobilinogen, UA: 0.2 mg/dL (ref 0.0–1.0)
pH: 5 (ref 5.0–8.0)

## 2014-06-07 LAB — BASIC METABOLIC PANEL
Anion gap: 34 — ABNORMAL HIGH (ref 5–15)
BUN: 50 mg/dL — AB (ref 6–23)
CALCIUM: 10.6 mg/dL — AB (ref 8.4–10.5)
CO2: 9 mEq/L — CL (ref 19–32)
Chloride: 85 mEq/L — ABNORMAL LOW (ref 96–112)
Creatinine, Ser: 1.66 mg/dL — ABNORMAL HIGH (ref 0.50–1.10)
GFR calc Af Amer: 41 mL/min — ABNORMAL LOW (ref >90)
GFR calc non Af Amer: 36 mL/min — ABNORMAL LOW (ref >90)
GLUCOSE: 670 mg/dL — AB (ref 70–99)
POTASSIUM: 5.8 meq/L — AB (ref 3.7–5.3)
Sodium: 128 mEq/L — ABNORMAL LOW (ref 137–147)

## 2014-06-07 LAB — CBG MONITORING, ED: Glucose-Capillary: 599 mg/dL (ref 70–99)

## 2014-06-07 LAB — URINE MICROSCOPIC-ADD ON

## 2014-06-07 MED ORDER — HEPARIN SODIUM (PORCINE) 5000 UNIT/ML IJ SOLN
5000.0000 [IU] | Freq: Three times a day (TID) | INTRAMUSCULAR | Status: DC
  Administered 2014-06-07 – 2014-06-10 (×7): 5000 [IU] via SUBCUTANEOUS
  Filled 2014-06-07 (×11): qty 1

## 2014-06-07 MED ORDER — ADULT MULTIVITAMIN W/MINERALS CH
1.0000 | ORAL_TABLET | Freq: Every day | ORAL | Status: DC
  Administered 2014-06-08 – 2014-06-10 (×4): 1 via ORAL
  Filled 2014-06-07 (×4): qty 1

## 2014-06-07 MED ORDER — CETYLPYRIDINIUM CHLORIDE 0.05 % MT LIQD: 7.0000 mL | Freq: Two times a day (BID) | OROMUCOSAL | Status: DC

## 2014-06-07 MED ORDER — DEXTROSE-NACL 5-0.45 % IV SOLN
INTRAVENOUS | Status: DC
  Administered 2014-06-08 (×2): via INTRAVENOUS

## 2014-06-07 MED ORDER — CHLORHEXIDINE GLUCONATE 0.12 % MT SOLN
15.0000 mL | Freq: Two times a day (BID) | OROMUCOSAL | Status: DC
  Administered 2014-06-08: 15 mL via OROMUCOSAL
  Filled 2014-06-07 (×6): qty 15

## 2014-06-07 MED ORDER — SODIUM CHLORIDE 0.9 % IV SOLN
INTRAVENOUS | Status: DC
  Filled 2014-06-07: qty 2.5

## 2014-06-07 MED ORDER — SODIUM CHLORIDE 0.9 % IV SOLN
INTRAVENOUS | Status: DC
  Administered 2014-06-08: via INTRAVENOUS

## 2014-06-07 MED ORDER — FAMOTIDINE 10 MG PO TABS
10.0000 mg | ORAL_TABLET | Freq: Two times a day (BID) | ORAL | Status: DC
  Administered 2014-06-08 – 2014-06-10 (×6): 10 mg via ORAL
  Filled 2014-06-07 (×7): qty 1

## 2014-06-07 MED ORDER — INFLUENZA VAC SPLIT QUAD 0.5 ML IM SUSY
0.5000 mL | PREFILLED_SYRINGE | INTRAMUSCULAR | Status: AC
  Administered 2014-06-08: 0.5 mL via INTRAMUSCULAR
  Filled 2014-06-07 (×2): qty 0.5

## 2014-06-07 MED ORDER — MYCOPHENOLATE MOFETIL 250 MG PO CAPS
500.0000 mg | ORAL_CAPSULE | Freq: Two times a day (BID) | ORAL | Status: DC
  Administered 2014-06-08 – 2014-06-10 (×6): 500 mg via ORAL
  Filled 2014-06-07 (×8): qty 2

## 2014-06-07 MED ORDER — SODIUM CHLORIDE 0.9 % IV BOLUS (SEPSIS)
1000.0000 mL | Freq: Once | INTRAVENOUS | Status: AC
  Administered 2014-06-07: 1000 mL via INTRAVENOUS

## 2014-06-07 MED ORDER — SODIUM CHLORIDE 0.9 % IV SOLN
INTRAVENOUS | Status: DC
  Administered 2014-06-07: 5.4 [IU]/h via INTRAVENOUS
  Filled 2014-06-07: qty 2.5

## 2014-06-07 MED ORDER — LEVOTHYROXINE SODIUM 137 MCG PO TABS
137.0000 ug | ORAL_TABLET | Freq: Every day | ORAL | Status: DC
  Administered 2014-06-08 – 2014-06-10 (×3): 137 ug via ORAL
  Filled 2014-06-07 (×4): qty 1

## 2014-06-07 MED ORDER — ISOSORB DINITRATE-HYDRALAZINE 20-37.5 MG PO TABS
1.0000 | ORAL_TABLET | Freq: Two times a day (BID) | ORAL | Status: DC
  Filled 2014-06-07 (×4): qty 1

## 2014-06-07 MED ORDER — AMLODIPINE BESYLATE 5 MG PO TABS
5.0000 mg | ORAL_TABLET | Freq: Every day | ORAL | Status: DC
  Administered 2014-06-08 – 2014-06-10 (×3): 5 mg via ORAL
  Filled 2014-06-07 (×3): qty 1

## 2014-06-07 MED ORDER — CARVEDILOL 12.5 MG PO TABS
50.0000 mg | ORAL_TABLET | Freq: Two times a day (BID) | ORAL | Status: DC
  Filled 2014-06-07: qty 2

## 2014-06-07 MED ORDER — PREDNISONE 5 MG PO TABS
5.0000 mg | ORAL_TABLET | Freq: Every day | ORAL | Status: DC
  Administered 2014-06-08 – 2014-06-10 (×3): 5 mg via ORAL
  Filled 2014-06-07 (×3): qty 1

## 2014-06-07 MED ORDER — DEXTROSE-NACL 5-0.45 % IV SOLN: INTRAVENOUS | Status: DC

## 2014-06-07 MED ORDER — TACROLIMUS 1 MG PO CAPS
1.0000 mg | ORAL_CAPSULE | Freq: Two times a day (BID) | ORAL | Status: DC
  Administered 2014-06-08 – 2014-06-10 (×6): 1 mg via ORAL
  Filled 2014-06-07 (×8): qty 1

## 2014-06-07 MED ORDER — DEXTROSE 50 % IV SOLN: 25.0000 mL | INTRAVENOUS | Status: DC | PRN

## 2014-06-07 NOTE — H&P (Signed)
[facility] History and Physical  [name] Parodyamela S Horsey XBJ:[id number] DOB: 1965-09-22 DOA: 06/07/2014  Referring physician: EDP PCP: Irena CordsOLADONATO,JOSEPH A, MD   Chief Complaint: Hyperglycemia   HPI: [name] is a 48 y.o. female who ran out of her insulin "a few days ago" and didn't take it because she cant afford it she states.  This morning she developed polyuria, polydipsia, stomach pains, afatigue, arthralgias.  BGL meter read "high".  She presents to the ED.  In the ED she is confirmed to be in DKA.  Review of Systems: Systems reviewed.  As above, otherwise negative  Past Medical History  Diagnosis Date  . Hypertension   . Hypothyroid   . Depression   . 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 Kidney disease   . End stage renal disease     right arm AV graft, post transplant  . Diabetes mellitus     Insulin dependant   Past Surgical History  Procedure Laterality Date  . Back surgery  2001  . Tubal ligation    . Dg av dialysis graft declot or    . Nephrectomy transplanted organ    . Kidney transplant  December 16, 2009  . 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  . Cataract extraction w/ intraocular lens implant Right   . Amputation Left 05/11/2014    Procedure: AMPUTATION LEFT GREAT TOE;  Surgeon: Kathryne Hitchhristopher Y Blackman, MD;  Location: [facility];  Service: Orthopedics;  Laterality: Left;   Social History:  reports that she quit smoking about 15 years ago. Her smoking use included Cigarettes. She has a 11 pack-year smoking history. She has never used smokeless tobacco. She reports that she drinks alcohol. She reports that she does not use illicit drugs.  Allergies  Allergen Reactions  . Ace Inhibitors Other (See Comments)    Cough   ( Lisinopril )  . Adhesive [Tape] Itching    Family History  Problem Relation Age of Onset  . Emphysema Mother   . Throat cancer Mother   . COPD Mother   . Cancer Mother   . Emphysema Father   . COPD Father   . Stroke  Father   . ADD / ADHD Son      Prior to Admission medications   Medication Sig Start Date End Date Taking? Authorizing Provider  amLODipine (NORVASC) 5 MG tablet Take 1 tablet (5 mg total) by mouth daily. 08/08/12  Yes Alison MurrayAlma M Devine, MD  carvedilol (COREG) 25 MG tablet Take 2 tablets (50 mg total) by mouth 2 (two) times daily. 08/08/12  Yes Alison MurrayAlma M Devine, MD  insulin aspart protamine- aspart (NOVOLOG MIX 70/30) (70-30) 100 UNIT/ML injection Inject 0.25 mLs (25 Units total) into the skin 2 (two) times daily with a meal. 03/23/14  Yes Rodolph Bonganiel Thompson V, MD  isosorbide-hydrALAZINE (BIDIL) 20-37.5 MG per tablet Take 1 tablet by mouth 2 (two) times daily. 05/12/14  Yes Catarina Hartshornavid Tat, MD  levothyroxine (SYNTHROID, LEVOTHROID) 137 MCG tablet Take 137 mcg by mouth daily. Name brand   Yes Historical Provider, MD  Multiple Vitamin (MULTIVITAMIN WITH MINERALS) TABS tablet Take 1 tablet by mouth daily.   Yes Historical Provider, MD  mycophenolate (CELLCEPT) 250 MG capsule Take 500 mg by mouth 2 (two) times daily.    Yes Historical Provider, MD  predniSONE (DELTASONE) 5 MG tablet Take 5 mg by mouth daily.     Yes Historical Provider, MD  ranitidine (ZANTAC) 150 MG capsule Take 150  mg by mouth daily.    Yes Historical Provider, MD  sodium bicarbonate 650 MG tablet Take 2 tablets (1,300 mg total) by mouth 2 (two) times daily. 05/12/14  Yes Catarina Hartshorn, MD  tacrolimus (PROGRAF) 1 MG capsule Take 1 mg by mouth 2 (two) times daily.    Yes Historical Provider, MD   Physical Exam: Filed Vitals:   06/07/14 1842  BP: 102/49  Pulse: 79  Temp: 98.3 F (36.8 C)  Resp: 20    BP 102/49  Pulse 79  Temp(Src) 98.3 F (36.8 C) (Oral)  Resp 20  SpO2 100%  LMP 11/19/2011  General Appearance:    Alert, oriented, no distress, room smells of acetone, appears stated age  Head:    Normocephalic, atraumatic  Eyes:    PERRL, EOMI, sclera non-icteric        Nose:   Nares without drainage or epistaxis. Mucosa, turbinates normal   Throat:   Moist mucous membranes. Oropharynx without erythema or exudate.  Neck:   Supple. No carotid bruits.  No thyromegaly.  No lymphadenopathy.   Back:     No CVA tenderness, no spinal tenderness  Lungs:     Clear to auscultation bilaterally, without wheezes, rhonchi or rales  Chest wall:    No tenderness to palpitation  Heart:    Regular rate and rhythm without murmurs, gallops, rubs  Abdomen:     Soft, non-tender, nondistended, normal bowel sounds, no organomegaly  Genitalia:    deferred  Rectal:    deferred  Extremities:   No clubbing, cyanosis or edema.  Pulses:   2+ and symmetric all extremities  Skin:   Skin color, texture, turgor normal, no rashes or lesions  Lymph nodes:   Cervical, supraclavicular, and axillary nodes normal  Neurologic:   CNII-XII intact. Normal strength, sensation and reflexes      throughout    Labs on Admission:  Basic Metabolic Panel:  Recent Labs Lab 06/07/14 2020  NA 128*  K 5.8*  CL 85*  CO2 9*  GLUCOSE 670*  BUN 50*  CREATININE 1.66*  CALCIUM 10.6*   Liver Function Tests: No results found for this basename: AST, ALT, ALKPHOS, BILITOT, PROT, ALBUMIN,  in the last 168 hours No results found for this basename: LIPASE, AMYLASE,  in the last 168 hours No results found for this basename: AMMONIA,  in the last 168 hours CBC:  Recent Labs Lab 06/07/14 2020  WBC 6.0  NEUTROABS 4.2  HGB 13.5  HCT 42.4  MCV 91.4  PLT 169   Cardiac Enzymes: No results found for this basename: CKTOTAL, CKMB, CKMBINDEX, TROPONINI,  in the last 168 hours  BNP (last 3 results) No results found for this basename: PROBNP,  in the last 8760 hours CBG:  Recent Labs Lab 06/07/14 1844  GLUCAP 599*    Radiological Exams on Admission: No results found.  EKG: Independently reviewed.  Assessment/Plan Principal Problem:   DKA, type 1 Active Problems:   Essential hypertension   Hyperkalemia   Hyponatremia   H/O kidney transplant   1. DKA type 1 -  due to not taking insulin for "a few days" 1. DKA protocol with insulin gtt, NPO, Q2H labs, and SDU admit. 2. Replace potassium PRN, current K is 5.8 so no potassium replacement ordered yet. 2. HTN - continue home meds 3. ESRD s/p renal transplant - kidney function actually appears to have improved since discharge last month, new creatinine is 1.5.  Continue anti-rejection drugs.  Code Status: Full code  Family Communication: No family in room Disposition Plan: Admit to inpatient   Time spent: 50 min  GARDNER, JARED M. [facility] Pager [phone]  If 7AM-7PM, please contact the day team taking care of the patient [email or web address] Password [email or web address] 06/07/2014, 10:00 PM

## 2014-06-07 NOTE — Progress Notes (Signed)
EDCM spoke to patient at bedside.  Patient confirms she has Medicare part A and B.  She does not have Medicare part D.  Patient reports she is unemployed and since her kidney transplant in 2011 she has been receiving disability.  Patient gets her disability check on the third of the month.  [facility], "I don't think I pay full price for them."  Patient reports her insulin is twenty five dollars.  Patient reports she does not have any money left at the end of the month due to paying her bills.  [facility] provided patient with list of discounted pharmacies and web sites needymeds.org and GoodRX.com for medication assistance, list of financial resources in thee communtiy such as local churches and [employer], urban ministries, [facility] also provided patient with list of [place] Crisis Assistance programs.  Patient thankful for resources.  No further EDCM needs at this time.

## 2014-06-07 NOTE — ED Notes (Addendum)
Per Dr. Julian ReilGardner, draw BMP at 2220 not 2143.

## 2014-06-07 NOTE — ED Notes (Signed)
RT made aware of venous blood gas

## 2014-06-07 NOTE — ED Notes (Signed)
Hold lab draw per RN. 

## 2014-06-07 NOTE — ED Notes (Signed)
Per EMS pt from home with c/o hyperglycemia, out of insulin for "few days", can't afford it; also c/o upper back pain.

## 2014-06-07 NOTE — ED Notes (Signed)
Bed: WA12 Expected date:  Expected time:  Means of arrival:  Comments: ems 

## 2014-06-07 NOTE — ED Provider Notes (Signed)
CSN: [id number]     Arrival date & time 06/07/14  1842 History   First MD Initiated Contact with Patient 06/07/14 1846     Chief Complaint  Patient presents with  . Hyperglycemia     (Consider location/radiation/quality/duration/timing/severity/associated sxs/prior Treatment) HPI  Patient to the ER by EMS for elevated glucose. She is a brittle diabetic who takes home insulin but has been out of insulin for the past few days. Starting this morning she developed multiple symptoms of myalgias, arthralgias, stomach pains, fatigue, polyuria, dry mouth. At home her meter read "high". Her medication is $25 bimonthly and the partner was going to try to get it tomorrow. Her BP is 102/49. Otherwise her vitals are stable. She is awake, alert and oriented. Denies nausea, vomiting or diarrhea.  Past Medical History  Diagnosis Date  . Hypertension   . Hypothyroid   . Depression   . 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 Kidney disease   . End stage renal disease     right arm AV graft, post transplant  . Diabetes mellitus     Insulin dependant   Past Surgical History  Procedure Laterality Date  . Back surgery  2001  . Tubal ligation    . Dg av dialysis graft declot or    . Nephrectomy transplanted organ    . Kidney transplant  December 16, 2009  . 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  . Cataract extraction w/ intraocular lens implant Right   . Amputation Left 05/11/2014    Procedure: AMPUTATION LEFT GREAT TOE;  Surgeon: Kathryne Hitchhristopher Y Blackman, MD;  Location: [facility];  Service: Orthopedics;  Laterality: Left;   Family History  Problem Relation Age of Onset  . Emphysema Mother   . Throat cancer Mother   . COPD Mother   . Cancer Mother   . Emphysema Father   . COPD Father   . Stroke Father   . ADD / ADHD Son    History  Substance Use Topics  . Smoking status: Former Smoker -- 1.00 packs/day for 11 years    Types: Cigarettes    Quit date: 09/21/1998  . Smokeless  tobacco: Never Used  . Alcohol Use: Yes     Comment: occasional   OB History   Grav Para Term Preterm Abortions TAB SAB Ect Mult Living                 Review of Systems  All other systems reviewed and are negative.     Allergies  Ace inhibitors and Adhesive  Home Medications   Prior to Admission medications   Medication Sig Start Date End Date Taking? Authorizing Provider  amLODipine (NORVASC) 5 MG tablet Take 1 tablet (5 mg total) by mouth daily. 08/08/12  Yes Alison MurrayAlma M Devine, MD  amoxicillin-clavulanate (AUGMENTIN) 500-125 MG per tablet Take 1 tablet (500 mg total) by mouth 2 (two) times daily. 05/12/14  Yes Catarina Hartshornavid Tat, MD  carvedilol (COREG) 25 MG tablet Take 2 tablets (50 mg total) by mouth 2 (two) times daily. 08/08/12  Yes Alison MurrayAlma M Devine, MD  doxycycline (VIBRAMYCIN) 100 MG capsule Take 1 capsule (100 mg total) by mouth 2 (two) times daily. 05/12/14  Yes Catarina Hartshornavid Tat, MD  HYDROcodone-acetaminophen (NORCO/VICODIN) 5-325 MG per tablet Take 1-2 tablets by mouth every 4 (four) hours as needed for moderate pain. 05/12/14  Yes Catarina Hartshornavid Tat, MD  insulin aspart protamine- aspart (NOVOLOG MIX 70/30) (70-30) 100 UNIT/ML injection Inject 0.25  mLs (25 Units total) into the skin 2 (two) times daily with a meal. 03/23/14  Yes Rodolph Bong, MD  isosorbide-hydrALAZINE (BIDIL) 20-37.5 MG per tablet Take 1 tablet by mouth 2 (two) times daily. 05/12/14  Yes Catarina Hartshorn, MD  levothyroxine (SYNTHROID, LEVOTHROID) 137 MCG tablet Take 137 mcg by mouth daily. Name brand   Yes Historical Provider, MD  Multiple Vitamin (MULTIVITAMIN WITH MINERALS) TABS tablet Take 1 tablet by mouth daily.   Yes Historical Provider, MD  mycophenolate (CELLCEPT) 250 MG capsule Take 500 mg by mouth 2 (two) times daily.    Yes Historical Provider, MD  predniSONE (DELTASONE) 5 MG tablet Take 5 mg by mouth daily.     Yes Historical Provider, MD  ranitidine (ZANTAC) 150 MG capsule Take 150 mg by mouth daily.    Yes Historical Provider, MD   sodium bicarbonate 650 MG tablet Take 2 tablets (1,300 mg total) by mouth 2 (two) times daily. 05/12/14  Yes Catarina Hartshorn, MD  tacrolimus (PROGRAF) 1 MG capsule Take 1 mg by mouth 2 (two) times daily.    Yes Historical Provider, MD   BP 102/49  Pulse 79  Temp(Src) 98.3 F (36.8 C) (Oral)  Resp 20  SpO2 100%  LMP 11/19/2011 Physical Exam  Nursing note and vitals reviewed. Constitutional: She appears well-developed and well-nourished. No distress.  HENT:  Head: Normocephalic and atraumatic.  Eyes: Pupils are equal, round, and reactive to light.  Neck: Normal range of motion. Neck supple.  Cardiovascular: Normal rate and regular rhythm.   Pulmonary/Chest: Effort normal.  Abdominal: Soft.  Musculoskeletal:  Recent surgical amputation to left great toe, appears without infection. Well healing  Neurological: She is alert.  Skin: Skin is warm and dry.    ED Course  Procedures (including critical care time) Labs Review Labs Reviewed  BASIC METABOLIC PANEL - Abnormal; Notable for the following:    Sodium 128 (*)    Potassium 5.8 (*)    Chloride 85 (*)    CO2 9 (*)    Glucose, Bld 670 (*)    BUN 50 (*)    Creatinine, Ser 1.66 (*)    Calcium 10.6 (*)    GFR calc non Af Amer 36 (*)    GFR calc Af Amer 41 (*)    Anion gap 34 (*)    All other components within normal limits  CBG MONITORING, ED - Abnormal; Notable for the following:    Glucose-Capillary 599 (*)    All other components within normal limits  CBC WITH DIFFERENTIAL  URINALYSIS, ROUTINE W REFLEX MICROSCOPIC  BLOOD GAS, ARTERIAL  CBG MONITORING, ED    Imaging Review No results found.   EKG Interpretation None      MDM   Final diagnoses:  Diabetic ketoacidosis without coma associated with type 1 diabetes mellitus    Patients BMP shows patient to be in significant metabolic acidosis.  Her glucose is 670 prior to initiating the gluco stabilizer. Sodium 128 K- 5.8 CL- 85 CO2- 9 BUN- 50 Creatinine-  1.66 Anion Gap of 34  CRITICAL CARE Performed by: Dorthula Matas Total critical care time: 35 minutes Critical care time was exclusive of separately billable procedures and treating other patients. Critical care was necessary to treat or prevent imminent or life-threatening deterioration. Critical care was time spent personally by me on the following activities: development of treatment plan with patient and/or surrogate as well as nursing, discussions with consultants, evaluation of patient's response to treatment, examination of patient,  obtaining history from patient or surrogate, ordering and performing treatments and interventions, ordering and review of laboratory studies, ordering and review of radiographic studies, pulse oximetry and re-evaluation of patient's condition.  Medications  sodium chloride 0.9 % bolus 1,000 mL (1,000 mLs Intravenous New Bag/Given 06/07/14 2057)  dextrose 5 %-0.45 % sodium chloride infusion (not administered)  insulin regular (NOVOLIN R,HUMULIN R) 250 Units in sodium chloride 0.9 % 250 mL (1 Units/mL) infusion (5.4 Units/hr Intravenous New Bag/Given 06/07/14 2058)    Filed Vitals:   06/07/14 1842  BP: 102/49  Pulse: 79  Temp: 98.3 F (36.8 C)  Resp: 20   9:42 pm  Patient admitted to inpatient, [facility].  Dorthula Matas, PA-C 06/07/14 2200

## 2014-06-08 DIAGNOSIS — E43 Unspecified severe protein-calorie malnutrition: Secondary | ICD-10-CM | POA: Insufficient documentation

## 2014-06-08 DIAGNOSIS — D899 Disorder involving the immune mechanism, unspecified: Secondary | ICD-10-CM

## 2014-06-08 DIAGNOSIS — E1010 Type 1 diabetes mellitus with ketoacidosis without coma: Secondary | ICD-10-CM | POA: Diagnosis not present

## 2014-06-08 DIAGNOSIS — E038 Other specified hypothyroidism: Secondary | ICD-10-CM

## 2014-06-08 LAB — BASIC METABOLIC PANEL
ANION GAP: 23 — AB (ref 5–15)
ANION GAP: 17 — AB (ref 5–15)
ANION GAP: 20 — AB (ref 5–15)
ANION GAP: 16 — AB (ref 5–15)
ANION GAP: 15 (ref 5–15)
Anion gap: 27 — ABNORMAL HIGH (ref 5–15)
Anion gap: 35 — ABNORMAL HIGH (ref 5–15)
Anion gap: 17 — ABNORMAL HIGH (ref 5–15)
BUN: 46 mg/dL — ABNORMAL HIGH (ref 6–23)
BUN: 44 mg/dL — ABNORMAL HIGH (ref 6–23)
BUN: 39 mg/dL — AB (ref 6–23)
BUN: 35 mg/dL — ABNORMAL HIGH (ref 6–23)
BUN: 33 mg/dL — ABNORMAL HIGH (ref 6–23)
BUN: 31 mg/dL — ABNORMAL HIGH (ref 6–23)
BUN: 48 mg/dL — AB (ref 6–23)
BUN: 50 mg/dL — AB (ref 6–23)
CALCIUM: 9.7 mg/dL (ref 8.4–10.5)
CALCIUM: 10 mg/dL (ref 8.4–10.5)
CHLORIDE: 92 meq/L — AB (ref 96–112)
CHLORIDE: 98 meq/L (ref 96–112)
CHLORIDE: 102 meq/L (ref 96–112)
CHLORIDE: 100 meq/L (ref 96–112)
CHLORIDE: 102 meq/L (ref 96–112)
CHLORIDE: 104 meq/L (ref 96–112)
CO2: 11 meq/L — AB (ref 19–32)
CO2: 15 meq/L — AB (ref 19–32)
CO2: 15 meq/L — AB (ref 19–32)
CO2: 18 meq/L — AB (ref 19–32)
CO2: 19 mEq/L (ref 19–32)
CO2: 19 meq/L (ref 19–32)
CO2: 20 mEq/L (ref 19–32)
CO2: 8 mEq/L — CL (ref 19–32)
CREATININE: 1.52 mg/dL — AB (ref 0.50–1.10)
CREATININE: 1.4 mg/dL — AB (ref 0.50–1.10)
Calcium: 9.5 mg/dL (ref 8.4–10.5)
Calcium: 9.9 mg/dL (ref 8.4–10.5)
Calcium: 9.6 mg/dL (ref 8.4–10.5)
Calcium: 9.8 mg/dL (ref 8.4–10.5)
Calcium: 9.8 mg/dL (ref 8.4–10.5)
Calcium: 10.1 mg/dL (ref 8.4–10.5)
Chloride: 94 mEq/L — ABNORMAL LOW (ref 96–112)
Chloride: 99 mEq/L (ref 96–112)
Creatinine, Ser: 1.63 mg/dL — ABNORMAL HIGH (ref 0.50–1.10)
Creatinine, Ser: 1.7 mg/dL — ABNORMAL HIGH (ref 0.50–1.10)
Creatinine, Ser: 1.28 mg/dL — ABNORMAL HIGH (ref 0.50–1.10)
Creatinine, Ser: 1.18 mg/dL — ABNORMAL HIGH (ref 0.50–1.10)
Creatinine, Ser: 1.23 mg/dL — ABNORMAL HIGH (ref 0.50–1.10)
Creatinine, Ser: 1.14 mg/dL — ABNORMAL HIGH (ref 0.50–1.10)
GFR calc Af Amer: 40 mL/min — ABNORMAL LOW (ref >90)
GFR calc Af Amer: 42 mL/min — ABNORMAL LOW (ref >90)
GFR calc Af Amer: 46 mL/min — ABNORMAL LOW (ref >90)
GFR calc Af Amer: 51 mL/min — ABNORMAL LOW (ref >90)
GFR calc Af Amer: 57 mL/min — ABNORMAL LOW (ref >90)
GFR calc Af Amer: 63 mL/min — ABNORMAL LOW (ref >90)
GFR calc Af Amer: 60 mL/min — ABNORMAL LOW (ref >90)
GFR calc Af Amer: 65 mL/min — ABNORMAL LOW (ref >90)
GFR calc non Af Amer: 35 mL/min — ABNORMAL LOW (ref >90)
GFR calc non Af Amer: 40 mL/min — ABNORMAL LOW (ref >90)
GFR calc non Af Amer: 44 mL/min — ABNORMAL LOW (ref >90)
GFR calc non Af Amer: 49 mL/min — ABNORMAL LOW (ref >90)
GFR calc non Af Amer: 54 mL/min — ABNORMAL LOW (ref >90)
GFR calc non Af Amer: 51 mL/min — ABNORMAL LOW (ref >90)
GFR calc non Af Amer: 56 mL/min — ABNORMAL LOW (ref >90)
GFR, EST NON AFRICAN AMERICAN: 37 mL/min — AB (ref >90)
GLUCOSE: 148 mg/dL — AB (ref 70–99)
GLUCOSE: 149 mg/dL — AB (ref 70–99)
GLUCOSE: 204 mg/dL — AB (ref 70–99)
GLUCOSE: 220 mg/dL — AB (ref 70–99)
GLUCOSE: 224 mg/dL — AB (ref 70–99)
GLUCOSE: 358 mg/dL — AB (ref 70–99)
GLUCOSE: 534 mg/dL — AB (ref 70–99)
Glucose, Bld: 95 mg/dL (ref 70–99)
POTASSIUM: 4.2 meq/L (ref 3.7–5.3)
POTASSIUM: 4.1 meq/L (ref 3.7–5.3)
POTASSIUM: 3.9 meq/L (ref 3.7–5.3)
Potassium: 5 mEq/L (ref 3.7–5.3)
Potassium: 4.1 mEq/L (ref 3.7–5.3)
Potassium: 4 mEq/L (ref 3.7–5.3)
Potassium: 4.3 mEq/L (ref 3.7–5.3)
Potassium: 4.3 mEq/L (ref 3.7–5.3)
SODIUM: 135 meq/L — AB (ref 137–147)
SODIUM: 135 meq/L — AB (ref 137–147)
SODIUM: 136 meq/L — AB (ref 137–147)
SODIUM: 138 meq/L (ref 137–147)
Sodium: 132 mEq/L — ABNORMAL LOW (ref 137–147)
Sodium: 136 mEq/L — ABNORMAL LOW (ref 137–147)
Sodium: 138 mEq/L (ref 137–147)
Sodium: 136 mEq/L — ABNORMAL LOW (ref 137–147)

## 2014-06-08 LAB — GLUCOSE, CAPILLARY
GLUCOSE-CAPILLARY: 221 mg/dL — AB (ref 70–99)
GLUCOSE-CAPILLARY: 89 mg/dL (ref 70–99)
GLUCOSE-CAPILLARY: 196 mg/dL — AB (ref 70–99)
GLUCOSE-CAPILLARY: 191 mg/dL — AB (ref 70–99)
GLUCOSE-CAPILLARY: 111 mg/dL — AB (ref 70–99)
GLUCOSE-CAPILLARY: 63 mg/dL — AB (ref 70–99)
Glucose-Capillary: 105 mg/dL — ABNORMAL HIGH (ref 70–99)
Glucose-Capillary: 110 mg/dL — ABNORMAL HIGH (ref 70–99)
Glucose-Capillary: 121 mg/dL — ABNORMAL HIGH (ref 70–99)
Glucose-Capillary: 128 mg/dL — ABNORMAL HIGH (ref 70–99)
Glucose-Capillary: 134 mg/dL — ABNORMAL HIGH (ref 70–99)
Glucose-Capillary: 139 mg/dL — ABNORMAL HIGH (ref 70–99)
Glucose-Capillary: 157 mg/dL — ABNORMAL HIGH (ref 70–99)
Glucose-Capillary: 166 mg/dL — ABNORMAL HIGH (ref 70–99)
Glucose-Capillary: 187 mg/dL — ABNORMAL HIGH (ref 70–99)
Glucose-Capillary: 192 mg/dL — ABNORMAL HIGH (ref 70–99)
Glucose-Capillary: 260 mg/dL — ABNORMAL HIGH (ref 70–99)
Glucose-Capillary: 331 mg/dL — ABNORMAL HIGH (ref 70–99)
Glucose-Capillary: 473 mg/dL — ABNORMAL HIGH (ref 70–99)
Glucose-Capillary: 544 mg/dL — ABNORMAL HIGH (ref 70–99)
Glucose-Capillary: 96 mg/dL (ref 70–99)

## 2014-06-08 LAB — MRSA PCR SCREENING: MRSA BY PCR: NEGATIVE

## 2014-06-08 MED ORDER — INSULIN ASPART 100 UNIT/ML ~~LOC~~ SOLN
0.0000 [IU] | Freq: Three times a day (TID) | SUBCUTANEOUS | Status: DC
  Administered 2014-06-09: 7 [IU] via SUBCUTANEOUS

## 2014-06-08 MED ORDER — INSULIN ASPART 100 UNIT/ML ~~LOC~~ SOLN: 0.0000 [IU] | Freq: Every day | SUBCUTANEOUS | Status: DC

## 2014-06-08 MED ORDER — SODIUM BICARBONATE 650 MG PO TABS
1300.0000 mg | ORAL_TABLET | Freq: Two times a day (BID) | ORAL | Status: DC
  Administered 2014-06-08 – 2014-06-10 (×4): 1300 mg via ORAL
  Filled 2014-06-08 (×5): qty 2

## 2014-06-08 MED ORDER — SODIUM CHLORIDE 0.9 % IV SOLN
INTRAVENOUS | Status: DC
  Administered 2014-06-08 – 2014-06-09 (×2): via INTRAVENOUS

## 2014-06-08 MED ORDER — ACETAMINOPHEN 325 MG PO TABS
650.0000 mg | ORAL_TABLET | Freq: Four times a day (QID) | ORAL | Status: DC | PRN
  Administered 2014-06-08 (×2): 650 mg via ORAL
  Filled 2014-06-08 (×2): qty 2

## 2014-06-08 MED ORDER — ACETAMINOPHEN 650 MG RE SUPP: 650.0000 mg | Freq: Four times a day (QID) | RECTAL | Status: DC | PRN

## 2014-06-08 MED ORDER — CARVEDILOL 6.25 MG PO TABS
6.2500 mg | ORAL_TABLET | Freq: Two times a day (BID) | ORAL | Status: DC
  Administered 2014-06-08 – 2014-06-09 (×3): 6.25 mg via ORAL
  Filled 2014-06-08 (×5): qty 1

## 2014-06-08 MED ORDER — INSULIN ASPART PROT & ASPART (70-30 MIX) 100 UNIT/ML ~~LOC~~ SUSP
20.0000 [IU] | Freq: Two times a day (BID) | SUBCUTANEOUS | Status: DC
  Administered 2014-06-08 – 2014-06-09 (×2): 20 [IU] via SUBCUTANEOUS
  Filled 2014-06-08: qty 10

## 2014-06-08 NOTE — Progress Notes (Signed)
INITIAL NUTRITION ASSESSMENT  Pt meets criteria for severe MALNUTRITION in the context of chronic illness as evidenced by <75% estimated energy intake with 9.7% weight loss in the past month.  DOCUMENTATION CODES Per approved criteria  -Severe malnutrition in the context of chronic illness -Obesity Unspecified   INTERVENTION: - Diet advancement per MD - Recommend Glucerna shakes BID once diet advanced - RD to continue to monitor   NUTRITION DIAGNOSIS: Inadequate oral intake related to inability to eat as evidenced by NPO.   Goal: Advance diet as tolerated to diabetic diet  Monitor:  Weights, labs, diet advancement  Reason for Assessment: Malnutrition screening tool   48 y.o. female  Admitting Dx: DKA, type 1  ASSESSMENT: Pt ran out of her insulin "a few days ago" and didn't take it because she cant afford it she states. Yesterday morning she developed polyuria, polydipsia, stomach pains, afatigue, arthralgias. BGL meter read "high". She presents to the ED where she was confirmed to be in DKA.  - Met with pt who reports she "eats when she feels like" PTA - Has light soup for lunch and meat/vegetables for dinner, does not eat breakfast - Drinks water and only eats sweets when her sugar is low - C/o sometimes having nausea when she wakes up at home - Pt denies any DM diet education needs stating "I know how I need to eat" and said that her CHO portions were WNL - Interested in getting Glucerna shakes when diet advanced - Weight down 22 pounds in the past month   Lab Results  Component Value Date   HGBA1C 11.5* 03/22/2014     Height: Ht Readings from Last 1 Encounters:  06/08/14 '5\' 8"'  (1.727 m)    Weight: Wt Readings from Last 1 Encounters:  06/08/14 205 lb 11 oz (93.3 kg)    Ideal Body Weight: 140 lbs   % Ideal Body Weight: 146%  Wt Readings from Last 10 Encounters:  06/08/14 205 lb 11 oz (93.3 kg)  05/11/14 227 lb 11.2 oz (103.284 kg)  05/11/14 227 lb 11.2  oz (103.284 kg)  05/11/14 227 lb 11.2 oz (103.284 kg)  04/21/14 223 lb 12.8 oz (101.515 kg)  03/23/14 222 lb 3.6 oz (100.8 kg)  08/05/13 217 lb (98.431 kg)  06/07/13 223 lb (101.152 kg)  03/07/13 240 lb 11.9 oz (109.2 kg)  01/06/13 236 lb 5.3 oz (107.2 kg)    Usual Body Weight: 240 lbs per pt  % Usual Body Weight: 85%  BMI:  Body mass index is 31.28 kg/(m^2). Class I obesity   Estimated Nutritional Needs: Kcal: 1650-1850 Protein: 65-80g Fluid: 1.6-1.8L/day   Skin: Multiple toe amputations   Diet Order: NPO  EDUCATION NEEDS: -No education needs identified at this time   Intake/Output Summary (Last 24 hours) at 06/08/14 1306 Last data filed at 06/08/14 1300  Gross per 24 hour  Intake  992.3 ml  Output    200 ml  Net  792.3 ml    Last BM: 10/1  Labs:   Recent Labs Lab 06/08/14 0445 06/08/14 0808 06/08/14 1205  NA 138 136* 135*  K 4.0 4.2 4.3  CL 102 99 100  CO2 19 20 15*  BUN 44* 39* 35*  CREATININE 1.40* 1.28* 1.18*  CALCIUM 9.8 9.8 9.7  GLUCOSE 148* 95 220*    CBG (last 3)   Recent Labs  06/08/14 0853 06/08/14 1004 06/08/14 1218  GLUCAP 110* 134* 196*    Scheduled Meds: . amLODipine  5 mg  Oral Daily  . antiseptic oral rinse  7 mL Mouth Rinse q12n4p  . carvedilol  6.25 mg Oral BID WC  . chlorhexidine  15 mL Mouth Rinse BID  . famotidine  10 mg Oral BID  . heparin  5,000 Units Subcutaneous 3 times per day  . levothyroxine  137 mcg Oral QAC breakfast  . multivitamin with minerals  1 tablet Oral Daily  . mycophenolate  500 mg Oral BID  . predniSONE  5 mg Oral Daily  . tacrolimus  1 mg Oral BID    Continuous Infusions: . dextrose 5 % and 0.45% NaCl 125 mL/hr (06/08/14 0832)  . insulin (NOVOLIN-R) infusion 4.1 mL/hr at 06/08/14 1220    Past Medical History  Diagnosis Date  . Hypertension   . Hypothyroid   . Depression   . 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 Kidney disease   . End stage renal disease     right  arm AV graft, post transplant  . Diabetes mellitus     Insulin dependant    Past Surgical History  Procedure Laterality Date  . Back surgery  2001  . Tubal ligation    . Dg av dialysis graft declot or    . Nephrectomy transplanted organ    . Kidney transplant  December 16, 2009  . 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  . Cataract extraction w/ intraocular lens implant Right   . Amputation Left 05/11/2014    Procedure: AMPUTATION LEFT GREAT TOE;  Surgeon: Mcarthur Rossetti, MD;  Location: [facility];  Service: Orthopedics;  Laterality: Left;    Carlis Stable MS, [place], [place] Pager [phone] Weekend/After Hours Pager

## 2014-06-08 NOTE — Progress Notes (Signed)
PROGRESS NOTE  [name] ZOX:[id number] DOB: Dec 15, 1965 DOA: 06/07/2014 PCP: Irena CordsOLADONATO,JOSEPH A, MD  Assessment/Plan: DKA -The patient normally takes bicarbonate for metabolic acidosis -As a result, patient may not have completely closed anion gap -Clinically improved -Transition to 70/30 -restart po bicarbonate -d/c IV insulin -start carb modified diet uncontrolled type 1 diabetes, with renal manifestations:  -70/30 insulin as discussed -last A1C 11.5  -continue low carb diet  left great toe osteomyelitis/left foot cellulitis:  -resolved, finished abx chronic renal failure (stage 2 at baseline) with renal transplant -patient with hx of renal transplant  -continue prednisone, cellcept and prograf (level pending)  HTN:  -Continue amlodipine -Decrease carvedilol due to soft blood pressure hypothyroidism:  -continue synthroid  Severe Protein Calorie Malnutrition -Nutritional supplementation   Family Communication:   Pt at beside Disposition Plan:   Home when medically stable       Procedures/Studies:  No results found.      Subjective: Patient feels hungry and wants to eat.  Patient denies fevers, chills, headache, chest pain, dyspnea, nausea, vomiting, diarrhea, abdominal pain, dysuria, hematuria   Objective: Filed Vitals:   06/08/14 1215 06/08/14 1226 06/08/14 1600 06/08/14 1646  BP: 129/50   168/66  Pulse: 77   80  Temp:  98.6 F (37 C) 98.5 F (36.9 C)   TempSrc:  Oral Oral   Resp: 16   14  Height:      Weight:      SpO2: 95%   97%    Intake/Output Summary (Last 24 hours) at 06/08/14 1832 Last data filed at 06/08/14 1643  Gross per 24 hour  Intake 1253.2 ml  Output    650 ml  Net  603.2 ml   Weight change:  Exam:   General:  Pt is alert, follows commands appropriately, not in acute distress  HEENT: No icterus, No thrush,  [place]/AT  Cardiovascular: RRR, S1/S2, no rubs, no gallops  Respiratory: CTA bilaterally, no  wheezing, no crackles, no rhonchi  Abdomen: Soft/+BS, non tender, non distended, no guarding  Extremities: trace LE edema, No lymphangitis, No petechiae, No rashes; L-hallux without erythema or drainage; no synovitis  Data Reviewed: Basic Metabolic Panel:  Recent Labs Lab 06/08/14 0445 06/08/14 0808 06/08/14 1205 06/08/14 1311 06/08/14 1522  NA 138 136* 135* 136* 138  K 4.0 4.2 4.3 4.3 4.1  CL 102 99 100 102 104  CO2 19 20 15* 18* 19  GLUCOSE 148* 95 220* 204* 149*  BUN 44* 39* 35* 33* 31*  CREATININE 1.40* 1.28* 1.18* 1.23* 1.14*  CALCIUM 9.8 9.8 9.7 10.0 10.1   Liver Function Tests: No results found for this basename: AST, ALT, ALKPHOS, BILITOT, PROT, ALBUMIN,  in the last 168 hours No results found for this basename: LIPASE, AMYLASE,  in the last 168 hours No results found for this basename: AMMONIA,  in the last 168 hours CBC:  Recent Labs Lab 06/07/14 2020  WBC 6.0  NEUTROABS 4.2  HGB 13.5  HCT 42.4  MCV 91.4  PLT 169   Cardiac Enzymes: No results found for this basename: CKTOTAL, CKMB, CKMBINDEX, TROPONINI,  in the last 168 hours BNP: No components found with this basename: POCBNP,  CBG:  Recent Labs Lab 06/08/14 1322 06/08/14 1426 06/08/14 1543 06/08/14 1648 06/08/14 1752  GLUCAP 191* 157* 166* 111* 63*    Recent Results (from the past 240 hour(s))  MRSA PCR SCREENING     Status: None  Collection Time    06/08/14 12:05 AM      Result Value Ref Range Status   MRSA by PCR NEGATIVE  NEGATIVE Final   Comment:            The GeneXpert MRSA Assay (FDA     approved for NASAL specimens     only), is one component of a     comprehensive MRSA colonization     surveillance program. It is not     intended to diagnose MRSA     infection nor to guide or     monitor treatment for     MRSA infections.     Scheduled Meds: . amLODipine  5 mg Oral Daily  . antiseptic oral rinse  7 mL Mouth Rinse q12n4p  . carvedilol  6.25 mg Oral BID WC  .  chlorhexidine  15 mL Mouth Rinse BID  . famotidine  10 mg Oral BID  . heparin  5,000 Units Subcutaneous 3 times per day  . insulin aspart  0-5 Units Subcutaneous QHS  . insulin aspart  0-9 Units Subcutaneous TID WC  . insulin aspart protamine- aspart  20 Units Subcutaneous BID WC  . levothyroxine  137 mcg Oral QAC breakfast  . multivitamin with minerals  1 tablet Oral Daily  . mycophenolate  500 mg Oral BID  . predniSONE  5 mg Oral Daily  . sodium bicarbonate  1,300 mg Oral BID  . tacrolimus  1 mg Oral BID   Continuous Infusions: . sodium chloride 50 mL/hr at 06/08/14 1643     Reeta Kuk, DO  [facility] Pager [phone]  If 7PM-7AM, please contact night-coverage [email or web address] Password [email or web address] 06/08/2014, 6:32 PM   LOS: 1 day

## 2014-06-08 NOTE — Care Management Note (Signed)
  Page 2 of 2   06/08/2014     11:43:07 AM CARE MANAGEMENT NOTE 06/08/2014  Patient:  [name],[name]   Account Number:  [id number]  Date Initiated:  06/08/2014  Documentation initiated by:  DAVIS,RHONDA  Subjective/Objective Assessment:   DKA     Action/Plan:   will need have resources for obtaining insulin   Anticipated DC Date:  06/11/2014   Anticipated DC Plan:  HOME/SELF CARE  In-house referral  Financial Counselor      DC Planning Services  CM consult      [facility]  [facility]   Choice offered to / List presented to:  NA   DME arranged  NA      DME agency  NA     HH arranged  NA      HH agency  NA   Status of service:  In process, will continue to follow Medicare Important Message given?   (If response is "NO", the following Medicare IM given date fields will be blank) Date Medicare IM given:   Medicare IM given by:   Date Additional Medicare IM given:   Additional Medicare IM given by:    Discharge Disposition:    Per UR Regulation:  Reviewed for med. necessity/level of care/duration of stay  If discussed at Long Length of Stay Meetings, dates discussed:    Comments:  [id number]/Rhonda Stark JockDavis, RN, BSN, [place] [phone] Chart Reviewed. Discharge needs at time of review: None present will follow for needs.

## 2014-06-08 NOTE — Progress Notes (Signed)
CARE MANAGEMENT NOTE 06/08/2014  Patient:  Exie ParodyCARROLL,Tabbatha S   Account Number:  [id number]  Date Initiated:  06/08/2014  Documentation initiated by:  DAVIS,RHONDA  Subjective/Objective Assessment:   DKA     Action/Plan:   will need have resources for obtaining insulin   Anticipated DC Date:  06/11/2014   Anticipated DC Plan:  HOME/SELF CARE  In-house referral  Financial Counselor      DC Planning Services  CM consult      [facility]   Choice offered to / List presented to:  NA   DME arranged  NA      DME agency  NA     HH arranged  NA      HH agency  NA   Status of service:  In process, will continue to follow Medicare Important Message given?   (If response is "NO", the following Medicare IM given date fields will be blank) Date Medicare IM given:   Medicare IM given by:   Date Additional Medicare IM given:   Additional Medicare IM given by:    Discharge Disposition:    Per UR Regulation:  Reviewed for med. necessity/level of care/duration of stay  If discussed at Long Length of Stay Meetings, dates discussed:    Comments:  [id number]/Rhonda Stark JockDavis, RN, BSN, [place] [phone] Chart Reviewed. Discharge needs at time of review: None present will follow for needs.

## 2014-06-08 NOTE — Progress Notes (Signed)
Report called to Selena BattenKim, Charity fundraiserN.  Transporting via wheelchair to room 1333.

## 2014-06-08 NOTE — Progress Notes (Deleted)
[id number]/Ibtisam Benge Earlene Plateravis, RN, BSN, [place]  [phone]  Chart Reviewed. Discharge needs at time of review: None present will follow for needs.

## 2014-06-08 NOTE — Progress Notes (Signed)
Hypoglycemic Event  CBG: 63  Treatment: 15 GM carbohydrate snack  Symptoms: None  Follow-up CBG: Time:1838 CBG Result:105  Possible Reasons for Event: Other: Pt transfer from ICU on insulin drip. CBG 111 prior to transfer from unit.  Patient's tray arrived as CBG of 63 was taken. CBG follow up taken after pt had eaten 50% of meal  Comments/MD notified:Follow CBG 105.    Katina Degreesborne, Eara Burruel Dionne  Remember to initiate Hypoglycemia Order Set & complete

## 2014-06-08 NOTE — Progress Notes (Addendum)
Inpatient Diabetes Program Recommendations  AACE/ADA: New Consensus Statement on Inpatient Glycemic Control (2013)  Target Ranges:  Prepandial:   less than 140 mg/dL      Peak postprandial:   less than 180 mg/dL (1-2 hours)      Critically ill patients:  140 - 180 mg/dL     Results for [name], [name] (MRN [id number]) as of 06/08/2014 07:20  Ref. Range 06/07/2014 20:20  Sodium Latest Range: 137-147 mEq/L 128 (L)  Potassium Latest Range: 3.7-5.3 mEq/L 5.8 (H)  Chloride Latest Range: 96-112 mEq/L 85 (L)  CO2 Latest Range: 19-32 mEq/L 9 (LL)  BUN Latest Range: 6-23 mg/dL 50 (H)  Creatinine Latest Range: 0.50-1.10 mg/dL 8.651.66 (H)  Calcium Latest Range: 8.4-10.5 mg/dL 78.410.6 (H)  GFR calc non Af Amer Latest Range: >90 mL/min 36 (L)  GFR calc Af Amer Latest Range: >90 mL/min 41 (L)  Glucose Latest Range: 70-99 mg/dL [phone] (HH)  Anion gap Latest Range: 5-15  34 (H)     Admitted with DKA.  CO2 9, Glucose 670 mg/dl, Anion gap 34 on admission labs.  Home Insulin regimen: 70/30 insulin- 25 units bid with meals  Current Insulin regimen: IV Insulin drip started at 9pm last evening    **Received referral for this patient.  Reason "Repeat DKA visits".    **Per chart review, patient had 4 admissions in 2014 for Hyperglycemia/ DKA/ Other medical issues.  Patient has had 4 admissions so far in 2015 for DKA/ Hyperglycemia.  **Per care manager note, "[name]  Patient reports her insulin is $25 per vial. Patient reports she does not have any money left at the end of the month due to paying her bills."  **Note DM Coordinator extensively counseled this patient back on 03/22/14 regarding the importance of taking her insulin and the importance of controlling her CBGs at home.    **Unsure at this point as to how to help patient.  [facility] $[facility] per vial which is the lowest  cost insulin available.  Based on how much insulin she uses per day, patient needs 1.5 vials of 70/30 insulin per month which would be $37.50 per month for the insulin.    **The [employer] insulin drug company does have a program for the underinsured receiving Medicare benefits, however, patient must be enrolled in Medicare Part D to qualify.  Per care manager notes, this patient does NOT have Medicare Part D. Coverage.    **Gave patient the [employer] information and application.  Explained to patient that if she can enroll in Medicare Part D then she will likely qualify for the [employer] patient assistance program and can get Humalog 75/25 insulin for free.  Patient stated she understood and would look into it.      MD- When patient'[name] labs have improved and she is ready to transition off the IV insulin drip, please make sure to restart 70/30 insulin 1-2 hours before IV insulin drip stopped     Will follow Ambrose FinlandJeannine Johnston Mckynlie Vanderslice RN, MSN, CDE Diabetes Coordinator Inpatient Diabetes Program Team Pager: [phone] (8a-10p)

## 2014-06-09 DIAGNOSIS — E43 Unspecified severe protein-calorie malnutrition: Secondary | ICD-10-CM

## 2014-06-09 LAB — BASIC METABOLIC PANEL
ANION GAP: 20 — AB (ref 5–15)
BUN: 24 mg/dL — ABNORMAL HIGH (ref 6–23)
CHLORIDE: 103 meq/L (ref 96–112)
CO2: 15 meq/L — AB (ref 19–32)
Calcium: 10 mg/dL (ref 8.4–10.5)
Creatinine, Ser: 1.28 mg/dL — ABNORMAL HIGH (ref 0.50–1.10)
GFR calc non Af Amer: 49 mL/min — ABNORMAL LOW (ref >90)
GFR, EST AFRICAN AMERICAN: 57 mL/min — AB (ref >90)
Glucose, Bld: 322 mg/dL — ABNORMAL HIGH (ref 70–99)
Potassium: 4.5 mEq/L (ref 3.7–5.3)
SODIUM: 138 meq/L (ref 137–147)

## 2014-06-09 LAB — GLUCOSE, CAPILLARY
GLUCOSE-CAPILLARY: 327 mg/dL — AB (ref 70–99)
GLUCOSE-CAPILLARY: 90 mg/dL (ref 70–99)
Glucose-Capillary: 281 mg/dL — ABNORMAL HIGH (ref 70–99)
Glucose-Capillary: 218 mg/dL — ABNORMAL HIGH (ref 70–99)

## 2014-06-09 MED ORDER — CARVEDILOL 25 MG PO TABS
25.0000 mg | ORAL_TABLET | Freq: Two times a day (BID) | ORAL | Status: DC
  Administered 2014-06-09 – 2014-06-10 (×2): 25 mg via ORAL
  Filled 2014-06-09 (×4): qty 1

## 2014-06-09 MED ORDER — INSULIN ASPART 100 UNIT/ML ~~LOC~~ SOLN
0.0000 [IU] | Freq: Three times a day (TID) | SUBCUTANEOUS | Status: DC
  Administered 2014-06-09: 8 [IU] via SUBCUTANEOUS
  Administered 2014-06-09: 5 [IU] via SUBCUTANEOUS
  Administered 2014-06-10: 8 [IU] via SUBCUTANEOUS

## 2014-06-09 MED ORDER — INSULIN ASPART 100 UNIT/ML ~~LOC~~ SOLN: 0.0000 [IU] | Freq: Every day | SUBCUTANEOUS | Status: DC

## 2014-06-09 MED ORDER — INSULIN ASPART PROT & ASPART (70-30 MIX) 100 UNIT/ML ~~LOC~~ SUSP
25.0000 [IU] | Freq: Two times a day (BID) | SUBCUTANEOUS | Status: DC
  Administered 2014-06-09 – 2014-06-10 (×2): 25 [IU] via SUBCUTANEOUS

## 2014-06-09 NOTE — Progress Notes (Signed)
PROGRESS NOTE  [name]:[id number] DOB: 07/28/1966 DOA: 06/07/2014 PCP: [name], MD  Assessment/Plan: DKA  -The patient normally takes bicarbonate for metabolic acidosis  -As a result, patient may not have completely closed anion gap  -Clinically improved  -transitioned to 70/30  -restart po bicarbonate  -d/c IV insulin  -start carb modified diet  -06/09/14--saline lock IVF uncontrolled type 1 diabetes, with renal manifestations:  -06/09/14--70/30 insulin--increase to 25 units bid -last A1C 11.5  -continue low carb diet  -10/3--increase novolog sliding scale to moderate left great toe osteomyelitis/left foot cellulitis:  -resolved, finished abx  chronic renal failure (stage 2 at baseline) with renal transplant  -patient with hx of renal transplant  -continue prednisone, cellcept and prograf (level pending)  HTN:  -Continue amlodipine  -06/09/14--Increase carvedilol due to increase blood pressure Metabolic acidosis -restart po bicarbonate hypothyroidism:  -continue synthroid  Severe Protein Calorie Malnutrition  -Nutritional supplementation  -06/09/14--start glucerna shakes Family Communication: Pt at beside  Disposition Plan: Home 06/10/14 if stable       Procedures/Studies:  No results found.      Subjective: Patient denies fevers, chills, headache, chest pain, dyspnea, nausea, vomiting, diarrhea, abdominal pain, dysuria, hematuria   Objective: Filed Vitals:   06/08/14 2104 06/09/14 0519 06/09/14 1049 06/09/14 1435  BP: 149/65 149/64 153/73 154/78  Pulse: 76 76 85 84  Temp: 98.3 F (36.8 C) 98.2 F (36.8 C) 98.6 F (37 C) 98.6 F (37 C)  TempSrc: Oral Oral Oral Oral  Resp: 16 14 16 16   Height:      Weight:      SpO2: 95% 98% 98% 98%    Intake/Output Summary (Last 24 hours) at 06/09/14 1526 Last data filed at 06/09/14 0808  Gross per 24 hour  Intake  570.7 ml  Output    300 ml  Net  270.7 ml   Weight change:    Exam:   General:  Pt is alert, follows commands appropriately, not in acute distress  HEENT: No icterus, No thrush,  [place]/AT  Cardiovascular: RRR, S1/S2, no rubs, no gallops  Respiratory: CTA bilaterally, no wheezing, no crackles, no rhonchi  Abdomen: Soft/+BS, non tender, non distended, no guarding  Extremities: trace LE edema, No lymphangitis, No petechiae, No rashes, no synovitis  Data Reviewed: Basic Metabolic Panel:  Recent Labs Lab 06/08/14 0808 06/08/14 1205 06/08/14 1311 06/08/14 1522 06/09/14 0526  NA 136* 135* 136* 138 138  K 4.2 4.3 4.3 4.1 4.5  CL 99 100 102 104 103  CO2 20 15* 18* 19 15*  GLUCOSE 95 220* 204* 149* 322*  BUN 39* 35* 33* 31* 24*  CREATININE 1.28* 1.18* 1.23* 1.14* 1.28*  CALCIUM 9.8 9.7 10.0 10.1 10.0   Liver Function Tests: No results found for this basename: AST, ALT, ALKPHOS, BILITOT, PROT, ALBUMIN,  in the last 168 hours No results found for this basename: LIPASE, AMYLASE,  in the last 168 hours No results found for this basename: AMMONIA,  in the last 168 hours CBC:  Recent Labs Lab 06/07/14 2020  WBC 6.0  NEUTROABS 4.2  HGB 13.5  HCT 42.4  MCV 91.4  PLT 169   Cardiac Enzymes: No results found for this basename: CKTOTAL, CKMB, CKMBINDEX, TROPONINI,  in the last 168 hours BNP: No components found with this basename: POCBNP,  CBG:  Recent Labs Lab 06/08/14 1752 06/08/14 1838 06/08/14 2106 06/09/14 0725 06/09/14 1155  GLUCAP 63* 105* 128* 327*  281*    Recent Results (from the past 240 hour(s))  MRSA PCR SCREENING     Status: None   Collection Time    06/08/14 12:05 AM      Result Value Ref Range Status   MRSA by PCR NEGATIVE  NEGATIVE Final   Comment:            The GeneXpert MRSA Assay (FDA     approved for NASAL specimens     only), is one component of a     comprehensive MRSA colonization     surveillance program. It is not     intended to diagnose MRSA     infection nor to guide or     monitor treatment  for     MRSA infections.     Scheduled Meds: . amLODipine  5 mg Oral Daily  . antiseptic oral rinse  7 mL Mouth Rinse q12n4p  . carvedilol  25 mg Oral BID WC  . famotidine  10 mg Oral BID  . heparin  5,000 Units Subcutaneous 3 times per day  . insulin aspart  0-15 Units Subcutaneous TID WC  . insulin aspart  0-5 Units Subcutaneous QHS  . insulin aspart protamine- aspart  25 Units Subcutaneous BID WC  . levothyroxine  137 mcg Oral QAC breakfast  . multivitamin with minerals  1 tablet Oral Daily  . mycophenolate  500 mg Oral BID  . predniSONE  5 mg Oral Daily  . sodium bicarbonate  1,300 mg Oral BID  . tacrolimus  1 mg Oral BID   Continuous Infusions: . sodium chloride 50 mL/hr at 06/09/14 0936     Sanam Marmo, DO  [facility] Pager [phone]  If 7PM-7AM, please contact night-coverage [email or web address] Password [email or web address] 06/09/2014, 3:26 PM   LOS: 2 days

## 2014-06-09 NOTE — ED Provider Notes (Signed)
Medical screening examination/treatment/procedure(s) were conducted as a shared visit with non-physician practitioner(s) and myself.  I personally evaluated the patient during the encounter.  Pt with hx iddm. Not compliant w rx. C/o gen weakness, malaise, polyuria, polydipsia, dry mouth.  Blood sugar very high 670.  Continuous pulse ox and monitor. Labs. Iv ns bolus.  hco3 returns very low, met acidosis, c/w dka.  Additional ns boluses.  Insulin gtt via glucose stabilizer.  CRITICAL CARE  RE diabetic ketoacidosis, metabolic acidosis, severe hyperglycemia, dehydration. Performed by: Mirna Mires Total critical care time: 35 Critical care time was exclusive of separately billable procedures and treating other patients. Critical care was necessary to treat or prevent imminent or life-threatening deterioration. Critical care was time spent personally by me on the following activities: development of treatment plan with patient and/or surrogate as well as nursing, discussions with consultants, evaluation of patient's response to treatment, examination of patient, obtaining history from patient or surrogate, ordering and performing treatments and interventions, ordering and review of laboratory studies, ordering and review of radiographic studies, pulse oximetry and re-evaluation of patient's condition.    Mirna Mires, MD 06/09/14 [phone]

## 2014-06-09 NOTE — Discharge Summary (Signed)
Physician Discharge Summary  [name] HYQ:[id number] DOB: 1966/01/15 DOA: 06/07/2014  PCP: Irena CordsOLADONATO,JOSEPH A, MD  Admit date: 06/07/2014 Discharge date: 06/10/14  Recommendations for Outpatient Follow-up:  1. Pt will need to follow up with PCP in 2 weeks post discharge 2. Please obtain BMP one week    Discharge Diagnoses:  DKA  -The patient normally takes bicarbonate for metabolic acidosis  -As a result, patient may not have completely closed anion gap  -Patient initially presented with anion gap of 35 with serum glucose of 670 -The patient was started on insulin IV drip as well as intravenous fluids -Discontinued when the patient was clinically improved and anion gap closed -The patient has a history of metabolic acidosis and normally took bicarbonate. As a result, the patient anion gap and a completely closed before transitioning the patient to subcutaneous insulin -Nevertheless, the patient was clinically improved and continued to improve clinically after transitioning to subcutaneous insulin -The patient was started on a carbohydrate modified diet which she tolerated. -transitioned to 70/30  -restarted po bicarbonate which was initially d/ced at time of admission -d/c IV insulin  -06/09/14--saline lock IVF  uncontrolled type 1 diabetes, with renal manifestations:  -06/09/14--70/30 insulin--increase to 25 units bid  -last A1C 11.5  -continue low carb diet  -10/3--increase novolog sliding scale to moderate  -Patient will be discharged on the above 70/30 insulin regimen left great toe osteomyelitis/left foot cellulitis:  -resolved, finished abx  chronic renal failure (stage 2 at baseline) with renal transplant  -patient with hx of renal transplant  -continue prednisone, cellcept and prograf (level pending)  -Serum creatinine 1.1 on the day of discharge HTN:  -Continue amlodipine  -06/09/14--Increase carvedilol due to increase blood pressure  -Restart of BiDil home  dose Metabolic acidosis  -restart po bicarbonate 1300 mg twice a day hypothyroidism:  -continue synthroid  Severe Protein Calorie Malnutrition  -Nutritional supplementation  -06/09/14--start glucerna shakes   Discharge Condition: stable  Disposition: home  Diet:carbohydrate modified Wt Readings from Last 3 Encounters:  06/08/14 93.3 kg (205 lb 11 oz)  05/11/14 103.284 kg (227 lb 11.2 oz)  05/11/14 103.284 kg (227 lb 11.2 oz)    History of present illness:  48 year old female with a history of diabetes mellitus type 1 who presented to the emergency department with one-day symptoms of myalgias, arthralgias, abdominal pain, fatigue, polyuria. The patient had run out of her 70/30 insulin at home for 2 days prior to the admission. The patient had difficult affording the insulin and did not get it refilled.  At home, her glucometer read high.  As a result she came to the ED.  In the emergency room, the patient was noted to have serum glucose of 670 with an anion gap of 35. Urinalysis was negative for pyuria. The patient previously had a hemoglobin A1c on 03/22/2014 of 11.5. The patient was recently discharged from the hospital on 05/12/2014 after treatment for osteomyelitis of her left great toe and foot cellulitis. The patient was started on intravenous fluids and intravenous insulin. She was admitted to the step down unit. BMPs were monitored every 2 hours. The patient was subsequently transitioned to subcutaneous insulin. She clinically improved. Her diet was advanced which she tolerated.   Discharge Exam: Filed Vitals:   06/10/14 0459  BP: 174/89  Pulse: 80  Temp: 97.8 F (36.6 C)  Resp: 16   Filed Vitals:   06/09/14 1049 06/09/14 1435 06/09/14 1810 06/10/14 0459  BP: 153/73 154/78 149/71 174/89  Pulse: 85  84 87 80  Temp: 98.6 F (37 C) 98.6 F (37 C) 98.7 F (37.1 C) 97.8 F (36.6 C)  TempSrc: Oral Oral Oral Oral  Resp: 16 16 18 16   Height:      Weight:      SpO2: 98% 98%  98% 95%   General: A&O x 3, NAD, pleasant, cooperative Cardiovascular: RRR, no rub, no gallop, no S3 Respiratory: CTAB, no wheeze, no rhonchi Abdomen:soft, nontender, nondistended, positive bowel sounds Extremities: Trace LE edema, No lymphangitis, no petechiae  Discharge Instructions      Discharge Instructions   Diet - low sodium heart healthy    Complete by:  As directed      Increase activity slowly    Complete by:  As directed             Medication List    STOP taking these medications       amoxicillin-clavulanate 500-125 MG per tablet  Commonly known as:  AUGMENTIN     doxycycline 100 MG capsule  Commonly known as:  VIBRAMYCIN     HYDROcodone-acetaminophen 5-325 MG per tablet  Commonly known as:  NORCO/VICODIN      TAKE these medications       amLODipine 5 MG tablet  Commonly known as:  NORVASC  Take 1 tablet (5 mg total) by mouth daily.     carvedilol 25 MG tablet  Commonly known as:  COREG  Take 2 tablets (50 mg total) by mouth 2 (two) times daily.     insulin aspart protamine- aspart (70-30) 100 UNIT/ML injection  Commonly known as:  NOVOLOG MIX 70/30  Inject 0.25 mLs (25 Units total) into the skin 2 (two) times daily with a meal.     isosorbide-hydrALAZINE 20-37.5 MG per tablet  Commonly known as:  BIDIL  Take 1 tablet by mouth 2 (two) times daily.     levothyroxine 137 MCG tablet  Commonly known as:  SYNTHROID, LEVOTHROID  Take 137 mcg by mouth daily. Name brand     multivitamin with minerals Tabs tablet  Take 1 tablet by mouth daily.     mycophenolate 250 MG capsule  Commonly known as:  CELLCEPT  Take 500 mg by mouth 2 (two) times daily.     predniSONE 5 MG tablet  Commonly known as:  DELTASONE  Take 5 mg by mouth daily.     ranitidine 150 MG capsule  Commonly known as:  ZANTAC  Take 150 mg by mouth daily.     sodium bicarbonate 650 MG tablet  Take 2 tablets (1,300 mg total) by mouth 2 (two) times daily.     tacrolimus 1 MG  capsule  Commonly known as:  PROGRAF  Take 1 mg by mouth 2 (two) times daily.         The results of significant diagnostics from this hospitalization (including imaging, microbiology, ancillary and laboratory) are listed below for reference.    Significant Diagnostic Studies: No results found.   Microbiology: Recent Results (from the past 240 hour(s))  MRSA PCR SCREENING     Status: None   Collection Time    06/08/14 12:05 AM      Result Value Ref Range Status   MRSA by PCR NEGATIVE  NEGATIVE Final   Comment:            The GeneXpert MRSA Assay (FDA     approved for NASAL specimens     only), is one component of a  comprehensive MRSA colonization     surveillance program. It is not     intended to diagnose MRSA     infection nor to guide or     monitor treatment for     MRSA infections.     Labs: Basic Metabolic Panel:  Recent Labs Lab 06/08/14 1205 06/08/14 1311 06/08/14 1522 06/09/14 0526 06/10/14 0433  NA 135* 136* 138 138 139  K 4.3 4.3 4.1 4.5 3.8  CL 100 102 104 103 106  CO2 15* 18* 19 15* 21  GLUCOSE 220* 204* 149* 322* 149*  BUN 35* 33* 31* 24* 14  CREATININE 1.18* 1.23* 1.14* 1.28* 1.19*  CALCIUM 9.7 10.0 10.1 10.0 10.1   Liver Function Tests: No results found for this basename: AST, ALT, ALKPHOS, BILITOT, PROT, ALBUMIN,  in the last 168 hours No results found for this basename: LIPASE, AMYLASE,  in the last 168 hours No results found for this basename: AMMONIA,  in the last 168 hours CBC:  Recent Labs Lab 06/07/14 2020  WBC 6.0  NEUTROABS 4.2  HGB 13.5  HCT 42.4  MCV 91.4  PLT 169   Cardiac Enzymes: No results found for this basename: CKTOTAL, CKMB, CKMBINDEX, TROPONINI,  in the last 168 hours BNP: No components found with this basename: POCBNP,  CBG:  Recent Labs Lab 06/09/14 0725 06/09/14 1155 06/09/14 1731 06/09/14 2106 06/10/14 0732  GLUCAP 327* 281* 218* 90 267*    Time coordinating discharge:  Greater than 30  minutes  Signed:  Yovany Clock, DO [facility] Pager: [phone] 06/10/2014, 10:47 AM

## 2014-06-10 LAB — BASIC METABOLIC PANEL
Anion gap: 12 (ref 5–15)
BUN: 14 mg/dL (ref 6–23)
CHLORIDE: 106 meq/L (ref 96–112)
CO2: 21 mEq/L (ref 19–32)
Calcium: 10.1 mg/dL (ref 8.4–10.5)
Creatinine, Ser: 1.19 mg/dL — ABNORMAL HIGH (ref 0.50–1.10)
GFR calc Af Amer: 62 mL/min — ABNORMAL LOW (ref >90)
GFR calc non Af Amer: 53 mL/min — ABNORMAL LOW (ref >90)
GLUCOSE: 149 mg/dL — AB (ref 70–99)
Potassium: 3.8 mEq/L (ref 3.7–5.3)
Sodium: 139 mEq/L (ref 137–147)

## 2014-06-10 LAB — GLUCOSE, CAPILLARY: Glucose-Capillary: 267 mg/dL — ABNORMAL HIGH (ref 70–99)

## 2014-06-10 MED ORDER — ISOSORB DINITRATE-HYDRALAZINE 20-37.5 MG PO TABS
1.0000 | ORAL_TABLET | Freq: Two times a day (BID) | ORAL | Status: DC
  Filled 2014-06-10 (×2): qty 1

## 2014-06-10 NOTE — Progress Notes (Signed)
Patient discharged to home. Patient given discharge instructions and verbalized understanding. Medications from pharmacy given back to the patient. Patient transfered by wheelchair to vehicle.

## 2014-09-20 ENCOUNTER — Encounter (HOSPITAL_COMMUNITY): Payer: Self-pay | Admitting: Surgery

## 2014-10-22 ENCOUNTER — Encounter (HOSPITAL_COMMUNITY): Payer: Self-pay | Admitting: Emergency Medicine

## 2014-10-22 ENCOUNTER — Inpatient Hospital Stay (HOSPITAL_COMMUNITY)
Admission: EM | Admit: 2014-10-22 | Discharge: 2014-10-25 | DRG: 637 | Disposition: A | Discharge status: Home / Self Care | Payer: Medicare Other | Attending: Internal Medicine | Admitting: Internal Medicine

## 2014-10-22 DIAGNOSIS — M25561 Pain in right knee: Secondary | ICD-10-CM | POA: Diagnosis present

## 2014-10-22 DIAGNOSIS — Z9841 Cataract extraction status, right eye: Secondary | ICD-10-CM | POA: Diagnosis not present

## 2014-10-22 DIAGNOSIS — I120 Hypertensive chronic kidney disease with stage 5 chronic kidney disease or end stage renal disease: Secondary | ICD-10-CM | POA: Diagnosis present

## 2014-10-22 DIAGNOSIS — N179 Acute kidney failure, unspecified: Secondary | ICD-10-CM | POA: Diagnosis present

## 2014-10-22 DIAGNOSIS — Z89422 Acquired absence of other left toe(s): Secondary | ICD-10-CM | POA: Diagnosis not present

## 2014-10-22 DIAGNOSIS — Z961 Presence of intraocular lens: Secondary | ICD-10-CM | POA: Diagnosis present

## 2014-10-22 DIAGNOSIS — Z87891 Personal history of nicotine dependence: Secondary | ICD-10-CM

## 2014-10-22 DIAGNOSIS — Z9119 Patient's noncompliance with other medical treatment and regimen: Secondary | ICD-10-CM | POA: Diagnosis present

## 2014-10-22 DIAGNOSIS — N186 End stage renal disease: Secondary | ICD-10-CM | POA: Diagnosis present

## 2014-10-22 DIAGNOSIS — Z794 Long term (current) use of insulin: Secondary | ICD-10-CM | POA: Diagnosis not present

## 2014-10-22 DIAGNOSIS — Z940 Kidney transplant status: Secondary | ICD-10-CM | POA: Diagnosis not present

## 2014-10-22 DIAGNOSIS — R1110 Vomiting, unspecified: Secondary | ICD-10-CM

## 2014-10-22 DIAGNOSIS — E039 Hypothyroidism, unspecified: Secondary | ICD-10-CM | POA: Diagnosis present

## 2014-10-22 DIAGNOSIS — N189 Chronic kidney disease, unspecified: Secondary | ICD-10-CM

## 2014-10-22 DIAGNOSIS — K219 Gastro-esophageal reflux disease without esophagitis: Secondary | ICD-10-CM | POA: Diagnosis present

## 2014-10-22 DIAGNOSIS — Z89421 Acquired absence of other right toe(s): Secondary | ICD-10-CM | POA: Diagnosis not present

## 2014-10-22 DIAGNOSIS — E1110 Type 2 diabetes mellitus with ketoacidosis without coma: Secondary | ICD-10-CM | POA: Diagnosis present

## 2014-10-22 DIAGNOSIS — Z9842 Cataract extraction status, left eye: Secondary | ICD-10-CM | POA: Diagnosis not present

## 2014-10-22 DIAGNOSIS — F329 Major depressive disorder, single episode, unspecified: Secondary | ICD-10-CM | POA: Diagnosis present

## 2014-10-22 DIAGNOSIS — S8981XA Other specified injuries of right lower leg, initial encounter: Secondary | ICD-10-CM

## 2014-10-22 DIAGNOSIS — E860 Dehydration: Secondary | ICD-10-CM | POA: Diagnosis present

## 2014-10-22 DIAGNOSIS — E1010 Type 1 diabetes mellitus with ketoacidosis without coma: Secondary | ICD-10-CM | POA: Diagnosis present

## 2014-10-22 DIAGNOSIS — R739 Hyperglycemia, unspecified: Secondary | ICD-10-CM | POA: Diagnosis present

## 2014-10-22 DIAGNOSIS — Z888 Allergy status to other drugs, medicaments and biological substances status: Secondary | ICD-10-CM | POA: Diagnosis not present

## 2014-10-22 DIAGNOSIS — R112 Nausea with vomiting, unspecified: Secondary | ICD-10-CM | POA: Diagnosis present

## 2014-10-22 DIAGNOSIS — E872 Acidosis: Secondary | ICD-10-CM

## 2014-10-22 DIAGNOSIS — E8729 Other acidosis: Secondary | ICD-10-CM | POA: Diagnosis present

## 2014-10-22 DIAGNOSIS — I10 Essential (primary) hypertension: Secondary | ICD-10-CM

## 2014-10-22 LAB — URINALYSIS, ROUTINE W REFLEX MICROSCOPIC
HGB URINE DIPSTICK: NEGATIVE
Ketones, ur: >80 mg/dL — AB
Leukocytes, UA: NEGATIVE
NITRITE: NEGATIVE
Protein, ur: NEGATIVE mg/dL
Specific Gravity, Urine: 1.025 (ref 1.005–1.030)
UROBILINOGEN UA: 0.2 mg/dL (ref 0.0–1.0)
pH: 5 (ref 5.0–8.0)

## 2014-10-22 LAB — CBG MONITORING, ED
Glucose-Capillary: 498 mg/dL — ABNORMAL HIGH (ref 70–99)
Glucose-Capillary: 431 mg/dL — ABNORMAL HIGH (ref 70–99)

## 2014-10-22 LAB — BLOOD GAS, ARTERIAL
Acid-base deficit: 15 mmol/L — ABNORMAL HIGH (ref 0.0–2.0)
Bicarbonate: 10.5 mEq/L — ABNORMAL LOW (ref 20.0–24.0)
Drawn by: 295031
FIO2: 0.21 %
O2 SAT: 96.7 %
Patient temperature: 98.6
TCO2: 9.7 mmol/L (ref 0–100)
pCO2 arterial: 24.5 mmHg — ABNORMAL LOW (ref 35.0–45.0)
pH, Arterial: 7.256 — ABNORMAL LOW (ref 7.350–7.450)
pO2, Arterial: 110 mmHg — ABNORMAL HIGH (ref 80.0–100.0)

## 2014-10-22 LAB — CBC
HCT: 41.5 % (ref 36.0–46.0)
HCT: 41.2 % (ref 36.0–46.0)
Hemoglobin: 13.2 g/dL (ref 12.0–15.0)
Hemoglobin: 13.5 g/dL (ref 12.0–15.0)
MCH: 28.8 pg (ref 26.0–34.0)
MCH: 29.2 pg (ref 26.0–34.0)
MCHC: 31.8 g/dL (ref 30.0–36.0)
MCHC: 32.8 g/dL (ref 30.0–36.0)
MCV: 90.4 fL (ref 78.0–100.0)
MCV: 89.2 fL (ref 78.0–100.0)
PLATELETS: 196 10*3/uL (ref 150–400)
Platelets: 215 10*3/uL (ref 150–400)
RBC: 4.59 MIL/uL (ref 3.87–5.11)
RBC: 4.62 MIL/uL (ref 3.87–5.11)
RDW: 12.8 % (ref 11.5–15.5)
RDW: 12.9 % (ref 11.5–15.5)
WBC: 9.5 10*3/uL (ref 4.0–10.5)
WBC: 8.1 10*3/uL (ref 4.0–10.5)

## 2014-10-22 LAB — COMPREHENSIVE METABOLIC PANEL
ALK PHOS: 110 U/L (ref 39–117)
ALT: 13 U/L (ref 0–35)
AST: 15 U/L (ref 0–37)
Albumin: 3.6 g/dL (ref 3.5–5.2)
Anion gap: 22 — ABNORMAL HIGH (ref 5–15)
BILIRUBIN TOTAL: 2 mg/dL — AB (ref 0.3–1.2)
BUN: 45 mg/dL — AB (ref 6–23)
CO2: 11 mmol/L — AB (ref 19–32)
CREATININE: 1.79 mg/dL — AB (ref 0.50–1.10)
Calcium: 10.2 mg/dL (ref 8.4–10.5)
Chloride: 99 mmol/L (ref 96–112)
GFR calc Af Amer: 38 mL/min — ABNORMAL LOW (ref >90)
GFR, EST NON AFRICAN AMERICAN: 33 mL/min — AB (ref >90)
Glucose, Bld: 525 mg/dL — ABNORMAL HIGH (ref 70–99)
Potassium: 4.7 mmol/L (ref 3.5–5.1)
Sodium: 132 mmol/L — ABNORMAL LOW (ref 135–145)
Total Protein: 7 g/dL (ref 6.0–8.3)

## 2014-10-22 LAB — BASIC METABOLIC PANEL
Anion gap: 15 (ref 5–15)
BUN: 41 mg/dL — ABNORMAL HIGH (ref 6–23)
CHLORIDE: 109 mmol/L (ref 96–112)
CO2: 14 mmol/L — AB (ref 19–32)
Calcium: 9.9 mg/dL (ref 8.4–10.5)
Creatinine, Ser: 1.54 mg/dL — ABNORMAL HIGH (ref 0.50–1.10)
GFR calc non Af Amer: 39 mL/min — ABNORMAL LOW (ref >90)
GFR, EST AFRICAN AMERICAN: 45 mL/min — AB (ref >90)
Glucose, Bld: 395 mg/dL — ABNORMAL HIGH (ref 70–99)
Potassium: 4.5 mmol/L (ref 3.5–5.1)
Sodium: 138 mmol/L (ref 135–145)

## 2014-10-22 LAB — LIPASE, BLOOD: LIPASE: 16 U/L (ref 11–59)

## 2014-10-22 LAB — URINE MICROSCOPIC-ADD ON

## 2014-10-22 LAB — GLUCOSE, CAPILLARY
Glucose-Capillary: 242 mg/dL — ABNORMAL HIGH (ref 70–99)
Glucose-Capillary: 291 mg/dL — ABNORMAL HIGH (ref 70–99)
Glucose-Capillary: 389 mg/dL — ABNORMAL HIGH (ref 70–99)

## 2014-10-22 LAB — PHOSPHORUS: Phosphorus: 3.8 mg/dL (ref 2.3–4.6)

## 2014-10-22 LAB — MAGNESIUM: Magnesium: 1.8 mg/dL (ref 1.5–2.5)

## 2014-10-22 LAB — MRSA PCR SCREENING: MRSA by PCR: POSITIVE — AB

## 2014-10-22 MED ORDER — ACETAMINOPHEN 325 MG PO TABS
650.0000 mg | ORAL_TABLET | Freq: Four times a day (QID) | ORAL | Status: DC | PRN
Start: 2014-10-22 — End: 2014-10-25

## 2014-10-22 MED ORDER — DEXTROSE-NACL 5-0.45 % IV SOLN
INTRAVENOUS | Status: DC
  Administered 2014-10-23 (×2): via INTRAVENOUS

## 2014-10-22 MED ORDER — SENNA 8.6 MG PO TABS
1.0000 | ORAL_TABLET | Freq: Two times a day (BID) | ORAL | Status: DC
  Administered 2014-10-22 – 2014-10-25 (×5): 8.6 mg via ORAL
  Filled 2014-10-22 (×6): qty 1

## 2014-10-22 MED ORDER — CARVEDILOL 25 MG PO TABS
50.0000 mg | ORAL_TABLET | Freq: Two times a day (BID) | ORAL | Status: DC
  Administered 2014-10-23 – 2014-10-25 (×5): 50 mg via ORAL
  Filled 2014-10-22 (×6): qty 2

## 2014-10-22 MED ORDER — ONDANSETRON HCL 4 MG/2ML IJ SOLN: 4.0000 mg | Freq: Four times a day (QID) | INTRAMUSCULAR | Status: DC | PRN

## 2014-10-22 MED ORDER — SODIUM CHLORIDE 0.9 % IJ SOLN
3.0000 mL | Freq: Two times a day (BID) | INTRAMUSCULAR | Status: DC
  Administered 2014-10-25: 3 mL via INTRAVENOUS

## 2014-10-22 MED ORDER — PREDNISONE 5 MG PO TABS
5.0000 mg | ORAL_TABLET | Freq: Every day | ORAL | Status: DC
  Administered 2014-10-23 – 2014-10-25 (×3): 5 mg via ORAL
  Filled 2014-10-22 (×3): qty 1

## 2014-10-22 MED ORDER — INSULIN ASPART PROT & ASPART (70-30 MIX) 100 UNIT/ML ~~LOC~~ SUSP
25.0000 [IU] | Freq: Two times a day (BID) | SUBCUTANEOUS | Status: DC
  Administered 2014-10-23 – 2014-10-24 (×3): 25 [IU] via SUBCUTANEOUS
  Filled 2014-10-22: qty 10

## 2014-10-22 MED ORDER — ACETAMINOPHEN 650 MG RE SUPP: 650.0000 mg | Freq: Four times a day (QID) | RECTAL | Status: DC | PRN

## 2014-10-22 MED ORDER — POTASSIUM CHLORIDE 10 MEQ/100ML IV SOLN
INTRAVENOUS | Status: AC
  Administered 2014-10-22: 10 meq
  Filled 2014-10-22: qty 100

## 2014-10-22 MED ORDER — OXYCODONE HCL 5 MG PO TABS
5.0000 mg | ORAL_TABLET | ORAL | Status: DC | PRN
  Administered 2014-10-22 – 2014-10-24 (×7): 5 mg via ORAL
  Filled 2014-10-22 (×7): qty 1

## 2014-10-22 MED ORDER — SODIUM CHLORIDE 0.9 % IV SOLN
INTRAVENOUS | Status: DC
  Administered 2014-10-22: 3.3 [IU]/h via INTRAVENOUS
  Filled 2014-10-22: qty 2.5

## 2014-10-22 MED ORDER — ISOSORB DINITRATE-HYDRALAZINE 20-37.5 MG PO TABS
1.0000 | ORAL_TABLET | Freq: Two times a day (BID) | ORAL | Status: DC
  Administered 2014-10-23 – 2014-10-25 (×5): 1 via ORAL
  Filled 2014-10-22 (×5): qty 1

## 2014-10-22 MED ORDER — AMLODIPINE BESYLATE 5 MG PO TABS
5.0000 mg | ORAL_TABLET | Freq: Every day | ORAL | Status: DC
  Administered 2014-10-23 – 2014-10-25 (×3): 5 mg via ORAL
  Filled 2014-10-22 (×3): qty 1

## 2014-10-22 MED ORDER — SODIUM CHLORIDE 0.9 % IV SOLN
INTRAVENOUS | Status: DC
  Administered 2014-10-22: 21:00:00 via INTRAVENOUS

## 2014-10-22 MED ORDER — METOCLOPRAMIDE HCL 5 MG/ML IJ SOLN
10.0000 mg | Freq: Three times a day (TID) | INTRAMUSCULAR | Status: DC
  Administered 2014-10-22 – 2014-10-24 (×6): 10 mg via INTRAVENOUS
  Filled 2014-10-22 (×7): qty 2

## 2014-10-22 MED ORDER — GI COCKTAIL ~~LOC~~
30.0000 mL | Freq: Three times a day (TID) | ORAL | Status: DC | PRN
  Filled 2014-10-22: qty 30

## 2014-10-22 MED ORDER — TACROLIMUS 1 MG PO CAPS
1.0000 mg | ORAL_CAPSULE | Freq: Two times a day (BID) | ORAL | Status: DC
  Administered 2014-10-22 – 2014-10-25 (×6): 1 mg via ORAL
  Filled 2014-10-22 (×6): qty 1

## 2014-10-22 MED ORDER — MYCOPHENOLATE MOFETIL 250 MG PO CAPS
500.0000 mg | ORAL_CAPSULE | Freq: Two times a day (BID) | ORAL | Status: DC
  Administered 2014-10-22 – 2014-10-25 (×6): 500 mg via ORAL
  Filled 2014-10-22 (×6): qty 2

## 2014-10-22 MED ORDER — FENTANYL CITRATE 0.05 MG/ML IJ SOLN
50.0000 ug | Freq: Once | INTRAMUSCULAR | Status: AC
  Administered 2014-10-22: 50 ug via INTRAVENOUS
  Filled 2014-10-22: qty 2

## 2014-10-22 MED ORDER — ONDANSETRON HCL 4 MG PO TABS: 4.0000 mg | ORAL_TABLET | Freq: Four times a day (QID) | ORAL | Status: DC | PRN

## 2014-10-22 MED ORDER — HEPARIN SODIUM (PORCINE) 5000 UNIT/ML IJ SOLN
5000.0000 [IU] | Freq: Three times a day (TID) | INTRAMUSCULAR | Status: DC
  Administered 2014-10-22 – 2014-10-25 (×8): 5000 [IU] via SUBCUTANEOUS
  Filled 2014-10-22 (×9): qty 1

## 2014-10-22 MED ORDER — SODIUM CHLORIDE 0.9 % IV SOLN: INTRAVENOUS | Status: AC

## 2014-10-22 MED ORDER — ONDANSETRON HCL 4 MG/2ML IJ SOLN
4.0000 mg | Freq: Once | INTRAMUSCULAR | Status: AC
  Administered 2014-10-22: 4 mg via INTRAVENOUS
  Filled 2014-10-22: qty 2

## 2014-10-22 MED ORDER — SODIUM CHLORIDE 0.9 % IV BOLUS (SEPSIS)
2000.0000 mL | Freq: Once | INTRAVENOUS | Status: AC
Start: 2014-10-22 — End: 2014-10-22
  Administered 2014-10-22: 2000 mL via INTRAVENOUS

## 2014-10-22 MED ORDER — POTASSIUM CHLORIDE 10 MEQ/100ML IV SOLN
10.0000 meq | INTRAVENOUS | Status: AC
  Administered 2014-10-22: 10 meq via INTRAVENOUS
  Filled 2014-10-22: qty 100

## 2014-10-22 MED ORDER — FAMOTIDINE 20 MG PO TABS
20.0000 mg | ORAL_TABLET | Freq: Every day | ORAL | Status: DC
  Administered 2014-10-23 – 2014-10-25 (×3): 20 mg via ORAL
  Filled 2014-10-22 (×3): qty 1

## 2014-10-22 NOTE — ED Notes (Addendum)
Per EMS, pt with Hx of ESRD, renal transplant, dialysis c/o n/v onset last night, called EMS today for intractable emesis, EMS measured CBG to be 600, administered 500 mL NS, CBG reduced to 542.  Pt has Hx of long term memory issues but is at mental baseline of alert and oriented x 4 currently. Pt has Hx of DKA and hyperglycemia, not currently receiving dialysis.

## 2014-10-22 NOTE — ED Notes (Signed)
Pt aware of need for urine sample. Patient currently on telephone.

## 2014-10-22 NOTE — ED Notes (Signed)
Bed: [id number] Expected date:  Expected time:  Means of arrival:  Comments: DKA

## 2014-10-22 NOTE — H&P (Signed)
[facility] History and Physical  [name] ZOX:[id number] DOB: 1965-10-31 DOA: 10/22/2014  Referring physician: PA Jinny Sanders - [place] PCP: [name], MD   Chief Complaint: Nausea vomiting and Hyperglycemia  HPI: [name] is a 49 y.o. female  Pt reports numerous episodes of non-bilious and non-bloody emesis which started 1 day ago. Typically w/ fluid intake. Has not tried anything to improve symptoms. Associated w/ nausea which is constant. Denies diarrhea, abd pain, fevers, chest pain, shortness of breath, palpitations, headache, dysuria, frequency, back pain  Pt subjectively felt like her glucose was high today and called EMS. Pts glucose test strips ran out 3-4 days ago. EMS arrived and recorded glucose of 600.   CBG avg at home in 300s. Endorses compliance w/ home regimen on 07/30 (20 units BID)  No dialysis since renal transplant in 2011  Right knee pain: Started 2 days ago after stepping in hole walking through field. No actual polyp or swelling noted. The almost immediately painful. Comes and goes. Worse with ambulation. Patient did not actually fall  But omentum can carried forward. Patient describing what sounds like hyperextension injury.   Review of Systems:  Constitutional:  No weight loss, night sweats, Fevers, chills.  HEENT:  No headaches, Difficulty swallowing,Tooth/dental problems,Sore throat,  No sneezing, itching, ear ache, nasal congestion, post nasal drip,  Cardio-vascular:  No chest pain, Orthopnea, PND, swelling in lower extremities, anasarca, dizziness, palpitations  GI:  Per HPI Resp:   No shortness of breath with exertion or at rest. No excess mucus, no productive cough, No non-productive cough, No coughing up of blood.No change in color of mucus.No wheezing.No chest wall deformity  Skin:  no rash or lesions.  GU:  no dysuria, change in color of urine, no urgency or frequency. No flank pain.  Musculoskeletal:   No joint  pain or swelling. No decreased range of motion. No back pain.  Psych:  No change in mood or affect. No depression or anxiety. No memory loss.   Past Medical History  Diagnosis Date  . Hypertension   . Hypothyroid   . Depression   . 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 Kidney disease   . End stage renal disease     right arm AV graft, post transplant  . Diabetes mellitus     Insulin dependant   Past Surgical History  Procedure Laterality Date  . Back surgery  2001  . Tubal ligation    . Dg av dialysis graft declot or    . Nephrectomy transplanted organ    . Kidney transplant  December 16, 2009  . 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  . Cataract extraction w/ intraocular lens implant Right   . Amputation Left 05/11/2014    Procedure: AMPUTATION LEFT GREAT TOE;  Surgeon: Kathryne Hitch, MD;  Location: [facility];  Service: Orthopedics;  Laterality: Left;   Social History:  reports that she quit smoking about 16 years ago. Her smoking use included Cigarettes. She has a 11 pack-year smoking history. She has never used smokeless tobacco. She reports that she drinks alcohol. She reports that she does not use illicit drugs.  Allergies  Allergen Reactions  . Ace Inhibitors Other (See Comments)    Cough   ( Lisinopril )  . Adhesive [Tape] Itching    Family History  Problem Relation Age of Onset  . Emphysema Mother   . Throat cancer Mother   . COPD Mother   .  Cancer Mother   . Emphysema Father   . COPD Father   . Stroke Father   . ADD / ADHD Son      Prior to Admission medications   Medication Sig Start Date End Date Taking? Authorizing Provider  Multiple Vitamin (MULTIVITAMIN WITH MINERALS) TABS tablet Take 1 tablet by mouth daily.   Yes Historical Provider, MD  mycophenolate (CELLCEPT) 250 MG capsule Take 500 mg by mouth 2 (two) times daily.    Yes Historical Provider, MD  amLODipine (NORVASC) 5 MG tablet Take 1 tablet (5 mg total) by mouth daily. 08/08/12    Alison Murray, MD  carvedilol (COREG) 25 MG tablet Take 2 tablets (50 mg total) by mouth 2 (two) times daily. 08/08/12   Alison Murray, MD  insulin aspart protamine- aspart (NOVOLOG MIX 70/30) (70-30) 100 UNIT/ML injection Inject 0.25 mLs (25 Units total) into the skin 2 (two) times daily with a meal. 03/23/14   Rodolph Bong, MD  isosorbide-hydrALAZINE (BIDIL) 20-37.5 MG per tablet Take 1 tablet by mouth 2 (two) times daily. 05/12/14   Catarina Hartshorn, MD  levothyroxine (SYNTHROID, LEVOTHROID) 137 MCG tablet Take 137 mcg by mouth daily. Name brand    Historical Provider, MD  predniSONE (DELTASONE) 5 MG tablet Take 5 mg by mouth daily.      Historical Provider, MD  ranitidine (ZANTAC) 150 MG capsule Take 150 mg by mouth daily.     Historical Provider, MD  sodium bicarbonate 650 MG tablet Take 2 tablets (1,300 mg total) by mouth 2 (two) times daily. 05/12/14   Catarina Hartshorn, MD  tacrolimus (PROGRAF) 1 MG capsule Take 1 mg by mouth 2 (two) times daily.     Historical Provider, MD   Physical Exam: Filed Vitals:   10/22/14 1628 10/22/14 1643  BP:  98/56  Pulse: 84   Temp: 97.5 F (36.4 C)   TempSrc: Oral   Resp: 16   SpO2: 98%     Wt Readings from Last 3 Encounters:  06/08/14 93.3 kg (205 lb 11 oz)  05/11/14 103.284 kg (227 lb 11.2 oz)  04/21/14 101.515 kg (223 lb 12.8 oz)    General:  Appears calm. Shaking and requesting warm blankets from feeling cold since starting IVF.  Eyes:  PERRL, normal lids, irises & conjunctiva ENT: dry mucus membranes grossly normal hearing, lips & tongue Neck:  no LAD, masses or thyromegaly Cardiovascular:  RRR, no m/r/g. No LE edema. Respiratory:  CTA bilaterally, no w/r/r. Normal respiratory effort. Abdomen:  soft, ntnd Skin:  no rash or induration seen on limited exam Musculoskeletal: right knee full range of motion, no effusion, mildly positive apprehension test, minimal joint line tenderness bilaterally in the right knee. No erythema or  induration. Psychiatric:  grossly normal mood and affect, speech fluent and appropriate Neurologic:  grossly non-focal.          Labs on Admission:  Basic Metabolic Panel:  Recent Labs Lab 10/22/14 1616  NA 132*  K 4.7  CL 99  CO2 11*  GLUCOSE 525*  BUN 45*  CREATININE 1.79*  CALCIUM 10.2   Liver Function Tests:  Recent Labs Lab 10/22/14 1616  AST 15  ALT 13  ALKPHOS 110  BILITOT 2.0*  PROT 7.0  ALBUMIN 3.6    Recent Labs Lab 10/22/14 1635  LIPASE 16   No results for input(s): AMMONIA in the last 168 hours. CBC:  Recent Labs Lab 10/22/14 1616  WBC 9.5  HGB 13.2  HCT 41.5  MCV 90.4  PLT 215   Cardiac Enzymes: No results for input(s): CKTOTAL, CKMB, CKMBINDEX, TROPONINI in the last 168 hours.  BNP (last 3 results) No results for input(s): BNP in the last 8760 hours.  ProBNP (last 3 results) No results for input(s): PROBNP in the last 8760 hours.  CBG:  Recent Labs Lab 10/22/14 1621 10/22/14 1839  GLUCAP 498* 431*    Radiological Exams on Admission: No results found.  EKG: ordered  Assessment/Plan Principal Problem:   DKA, type 1 Active Problems:   Essential hypertension   H/O kidney transplant   High anion gap metabolic acidosis   Hyperglycemia   DKA (diabetic ketoacidoses)   Dehydration   Hyperextension injury of right knee   GERD without esophagitis   Hypothyroid   Acute-on-chronic kidney injury  DKA: Type 1 diabetic. Last A1c 11.5. Home CBG avg 300 per pt. Admission glucose 525. Pt either not taking properly or not taking enough insulin. Anion Gap 22, ABG pH 7.25, CO2 24.5, O2 110, BIcarb 10.5. Home Novolog 70/30, 20 units twice daily - Admit - tele - Glucose stabilizer (order set) - A1c - continue home bicarb  Nausea and vomiting: likely secondary to DKA and likely gastroporesis, but cannot r/o viral gastro. No sign of acute infectious process, pancreatitis, cholecystitis, or appendicitis. Lipase 16, AST/ALT nml. Benign  abd on exam.  - Zofran - Reglan - IVF NS 12325ml/hg  R knee pain: pt hyperextended 2 days ago. No sign of significant injury/tear. - PT - consider plain film if not improving - Tylenol - Heat and Ice  Renal transplant: Cr 1.79. Baseline 1.2 Transplant in 2011. Previously on dialysis for ESRD due to DM per pt.  - cont prednisone, Cellcept and Prograf  HTN: hypotensive. Likely dehydrated and still taking medications - NS bolus 1L - NS as above - Restart Bidil, Norvasc, Coreg in the morning - if remains low consider stress dose steroids  GERD: - continue home H2 blocker  Hypothyroid:  - cont synthroid   Code Status: FULL DVT Prophylaxis: Heparin Family Communication: none Disposition Plan: pending improvement    Anicka Stuckert Shela CommonsJ, MD Family Medicine [facility] [email or web address] Password [email or web address]

## 2014-10-22 NOTE — Progress Notes (Signed)
  CARE MANAGEMENT ED NOTE 10/22/2014  Patient:  [name],[name] S   Account Number:  [id number]  Date Initiated:  10/22/2014  Documentation initiated by:  Radford PaxFERRERO,Shonta Bourque  Subjective/Objective Assessment:   Patient presents to Ed with intractable vomitting, hyperglycemia     Subjective/Objective Assessment Detail:   Patient with pmhx of DKA, dialysis, HTN, depression, hypothyroid, DM, hypothyroid, end stage reanl disease.    Blood sugar 525 in ED.     Action/Plan:   IV fluids with IV insulin drip started.   Action/Plan Detail:   Anticipated DC Date:       Status Recommendation to Physician:   Result of Recommendation:    Other ED Services  Consult Working Plan    DC Planning Services  Other  Medication Assistance    Choice offered to / List presented to:            Status of service:  Completed, signed off  ED Comments:   ED Comments Detail:  [facility] having difficulty picking up her medications.  EDCM called patient on unit in room 1416.  [facility] if she is having difficulty picking up her medications?  Patient reported, "I was confused when I first came in.  [facility]  [facility] [address] Patient reports she cannot think of anything else she needs assistance with at this moment.  Patient reports she lives with her husband and her son.  Patient reports her husband does not drive either.  No further EDCM needs at this time.

## 2014-10-22 NOTE — ED Provider Notes (Signed)
CSN: [id number]     Arrival date & time 10/22/14  1607 History   First MD Initiated Contact with Patient 10/22/14 1611     Chief Complaint  Patient presents with  . Hyperglycemia     (Consider location/radiation/quality/duration/timing/severity/associated sxs/prior Treatment) HPI [name] is a 49 year old female with past medical history of end-stage renal disease status post right renal transplant 2011, insulin-dependent diabetes who presents the ER complaining of hyperglycemia, nausea vomiting. Patient reports several episodes yesterday of nonbilious, nonbloody emesis, and reports she thought that her blood sugar was high today, and called EMS. EMS noted patient to have CBG of 600. Patient states she has been unable to check her CBG for the past 3-4 days due to being out of her glucometer strips. Patient reports some associated mild lightheadedness. Patient denies fever, headache, blurred vision, chest pain, shortness of breath, abdominal pain, diarrhea, dysuria.  Past Medical History  Diagnosis Date  . Hypertension   . Hypothyroid   . Depression   . 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 Kidney disease   . End stage renal disease     right arm AV graft, post transplant  . Diabetes mellitus     Insulin dependant   Past Surgical History  Procedure Laterality Date  . Back surgery  2001  . Tubal ligation    . Dg av dialysis graft declot or    . Nephrectomy transplanted organ    . Kidney transplant  December 16, 2009  . 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  . Cataract extraction w/ intraocular lens implant Right   . Amputation Left 05/11/2014    Procedure: AMPUTATION LEFT GREAT TOE;  Surgeon: Kathryne Hitchhristopher Y Blackman, MD;  Location: [facility];  Service: Orthopedics;  Laterality: Left;   Family History  Problem Relation Age of Onset  . Emphysema Mother   . Throat cancer Mother   . COPD Mother   . Cancer Mother   . Emphysema Father   . COPD Father   . Stroke  Father   . ADD / ADHD Son    History  Substance Use Topics  . Smoking status: Former Smoker -- 1.00 packs/day for 11 years    Types: Cigarettes    Quit date: 09/21/1998  . Smokeless tobacco: Never Used  . Alcohol Use: Yes     Comment: occasional   OB History    No data available     Review of Systems  Constitutional: Negative for fever.  HENT: Negative for trouble swallowing.   Eyes: Negative for visual disturbance.  Respiratory: Negative for shortness of breath.   Cardiovascular: Negative for chest pain.  Gastrointestinal: Positive for nausea and vomiting. Negative for abdominal pain.  Endocrine:       Hyperglycemia  Genitourinary: Negative for dysuria.  Musculoskeletal: Negative for neck pain.  Skin: Negative for rash.  Neurological: Positive for light-headedness. Negative for dizziness, weakness and numbness.  Psychiatric/Behavioral: Negative.       Allergies  Ace inhibitors and Adhesive  Home Medications   Prior to Admission medications   Medication Sig Start Date End Date Taking? Authorizing Provider  Multiple Vitamin (MULTIVITAMIN WITH MINERALS) TABS tablet Take 1 tablet by mouth daily.   Yes Historical Provider, MD  mycophenolate (CELLCEPT) 250 MG capsule Take 500 mg by mouth 2 (two) times daily.    Yes Historical Provider, MD  amLODipine (NORVASC) 5 MG tablet Take 1 tablet (5 mg total) by mouth daily. 08/08/12   Alma  Concepcion Elk, MD  carvedilol (COREG) 25 MG tablet Take 2 tablets (50 mg total) by mouth 2 (two) times daily. 08/08/12   Alison Murray, MD  insulin aspart protamine- aspart (NOVOLOG MIX 70/30) (70-30) 100 UNIT/ML injection Inject 0.25 mLs (25 Units total) into the skin 2 (two) times daily with a meal. 03/23/14   Rodolph Bong, MD  isosorbide-hydrALAZINE (BIDIL) 20-37.5 MG per tablet Take 1 tablet by mouth 2 (two) times daily. 05/12/14   Catarina Hartshorn, MD  levothyroxine (SYNTHROID, LEVOTHROID) 137 MCG tablet Take 137 mcg by mouth daily. Name brand     Historical Provider, MD  predniSONE (DELTASONE) 5 MG tablet Take 5 mg by mouth daily.      Historical Provider, MD  ranitidine (ZANTAC) 150 MG capsule Take 150 mg by mouth daily.     Historical Provider, MD  sodium bicarbonate 650 MG tablet Take 2 tablets (1,300 mg total) by mouth 2 (two) times daily. 05/12/14   Catarina Hartshorn, MD  tacrolimus (PROGRAF) 1 MG capsule Take 1 mg by mouth 2 (two) times daily.     Historical Provider, MD   BP 98/56 mmHg  Pulse 84  Temp(Src) 97.5 F (36.4 C) (Oral)  Resp 16  SpO2 98%  LMP 11/19/2011 Physical Exam  Constitutional: She is oriented to person, place, and time. She appears well-developed and well-nourished.  Well-developed well-nourished female in mild amount of distress from discomfort.  HENT:  Head: Normocephalic and atraumatic.  Mouth/Throat: Oropharynx is clear and moist. No oropharyngeal exudate.  Eyes: Right eye exhibits no discharge. Left eye exhibits no discharge. No scleral icterus.  Neck: Normal range of motion.  Cardiovascular: Normal rate, regular rhythm and normal heart sounds.   No murmur heard. Pulmonary/Chest: Effort normal and breath sounds normal. No respiratory distress.  Abdominal: Soft. Normal appearance and bowel sounds are normal. There is no tenderness. There is no rigidity, no guarding, no tenderness at McBurney's point and negative Murphy's sign.  Musculoskeletal: Normal range of motion. She exhibits no edema or tenderness.  Right knee exam: No obvious effusion, erythema, edema, warmth. Patient has mild joint line tenderness medially. No anterior/posterior instability or medial/lateral instability. DP pulse 2+. 5 out of 5 motor strength noted to hip, knee, ankle. Distal sensation intact.  Neurological: She is alert and oriented to person, place, and time. No cranial nerve deficit. Coordination normal.  Skin: Skin is warm and dry. No rash noted. She is not diaphoretic.  Psychiatric: She has a normal mood and affect.  Nursing note  and vitals reviewed.   ED Course  Procedures (including critical care time) Labs Review Labs Reviewed  COMPREHENSIVE METABOLIC PANEL - Abnormal; Notable for the following:    Sodium 132 (*)    CO2 11 (*)    Glucose, Bld 525 (*)    BUN 45 (*)    Creatinine, Ser 1.79 (*)    Total Bilirubin 2.0 (*)    GFR calc non Af Amer 33 (*)    GFR calc Af Amer 38 (*)    Anion gap 22 (*)    All other components within normal limits  CBG MONITORING, ED - Abnormal; Notable for the following:    Glucose-Capillary 498 (*)    All other components within normal limits  CBG MONITORING, ED - Abnormal; Notable for the following:    Glucose-Capillary 431 (*)    All other components within normal limits  CBC  LIPASE, BLOOD  URINALYSIS, ROUTINE W REFLEX MICROSCOPIC  BLOOD GAS, ARTERIAL  Imaging Review No results found.   EKG Interpretation None      MDM   Final diagnoses:  Diabetic ketoacidosis without coma associated with type 2 diabetes mellitus    Patient here with intractable nausea and vomiting since last night. Patient noted to be in DKA here, with anion gap of 22. Symptomatically treatment in place, with multiple fluid boluses. After fluid boluses patient CBG soma 400s, patient still complaining of generalized malaise and nausea. Patient also stating that she twisted her knee last night after stepping in a hole. I examined her knee and there is no obvious signs of injury. Patient neurovascularly intact. I recommended follow up with a knee x-ray, however patient declined stating she did not feel this was necessary at this point. We will provide patient some pain relief for her knee, and encourage her to follow-up with her primary care doctor. Due to patient's DKA, intractable nausea and history of a renal transplant, we will admit patient for her DKA. I spoke with Dr. Konrad Dolores who agreed to admit patient.  The patient appears reasonably stabilized for admission considering the current  resources, flow, and capabilities available in the ED at this time, and I doubt any other [facility] requiring further screening and/or treatment in the ED prior to admission.  BP 98/56 mmHg  Pulse 84  Temp(Src) 97.5 F (36.4 C) (Oral)  Resp 16  SpO2 98%  LMP 11/19/2011  Signed,  Ladona Mow, PA-C 6:49 PM  Patient seen and discussed with Dr. Purvis Sheffield, MD  Monte Fantasia, PA-C 10/22/14 1849  Purvis Sheffield, MD 10/23/14 [phone]

## 2014-10-23 ENCOUNTER — Inpatient Hospital Stay (HOSPITAL_COMMUNITY): Payer: Medicare Other

## 2014-10-23 LAB — BASIC METABOLIC PANEL
ANION GAP: 5 (ref 5–15)
ANION GAP: 8 (ref 5–15)
Anion gap: 10 (ref 5–15)
BUN: 36 mg/dL — ABNORMAL HIGH (ref 6–23)
BUN: 40 mg/dL — ABNORMAL HIGH (ref 6–23)
BUN: 41 mg/dL — ABNORMAL HIGH (ref 6–23)
CALCIUM: 10.1 mg/dL (ref 8.4–10.5)
CALCIUM: 10.3 mg/dL (ref 8.4–10.5)
CALCIUM: 9.9 mg/dL (ref 8.4–10.5)
CHLORIDE: 113 mmol/L — AB (ref 96–112)
CO2: 19 mmol/L (ref 19–32)
CO2: 19 mmol/L (ref 19–32)
CO2: 17 mmol/L — AB (ref 19–32)
Chloride: 109 mmol/L (ref 96–112)
Chloride: 111 mmol/L (ref 96–112)
Creatinine, Ser: 1.4 mg/dL — ABNORMAL HIGH (ref 0.50–1.10)
Creatinine, Ser: 1.44 mg/dL — ABNORMAL HIGH (ref 0.50–1.10)
Creatinine, Ser: 1.49 mg/dL — ABNORMAL HIGH (ref 0.50–1.10)
GFR calc Af Amer: 47 mL/min — ABNORMAL LOW (ref >90)
GFR calc Af Amer: 49 mL/min — ABNORMAL LOW (ref >90)
GFR calc Af Amer: 51 mL/min — ABNORMAL LOW (ref >90)
GFR calc non Af Amer: 42 mL/min — ABNORMAL LOW (ref >90)
GFR calc non Af Amer: 44 mL/min — ABNORMAL LOW (ref >90)
GFR, EST NON AFRICAN AMERICAN: 41 mL/min — AB (ref >90)
GLUCOSE: 278 mg/dL — AB (ref 70–99)
Glucose, Bld: 158 mg/dL — ABNORMAL HIGH (ref 70–99)
Glucose, Bld: 134 mg/dL — ABNORMAL HIGH (ref 70–99)
Potassium: 3.9 mmol/L (ref 3.5–5.1)
Potassium: 4.1 mmol/L (ref 3.5–5.1)
Potassium: 4.3 mmol/L (ref 3.5–5.1)
SODIUM: 137 mmol/L (ref 135–145)
SODIUM: 136 mmol/L (ref 135–145)
Sodium: 138 mmol/L (ref 135–145)

## 2014-10-23 LAB — GLUCOSE, CAPILLARY
GLUCOSE-CAPILLARY: 139 mg/dL — AB (ref 70–99)
GLUCOSE-CAPILLARY: 147 mg/dL — AB (ref 70–99)
GLUCOSE-CAPILLARY: 178 mg/dL — AB (ref 70–99)
GLUCOSE-CAPILLARY: 206 mg/dL — AB (ref 70–99)
GLUCOSE-CAPILLARY: 351 mg/dL — AB (ref 70–99)
Glucose-Capillary: 153 mg/dL — ABNORMAL HIGH (ref 70–99)
Glucose-Capillary: 131 mg/dL — ABNORMAL HIGH (ref 70–99)
Glucose-Capillary: 129 mg/dL — ABNORMAL HIGH (ref 70–99)
Glucose-Capillary: 136 mg/dL — ABNORMAL HIGH (ref 70–99)
Glucose-Capillary: 117 mg/dL — ABNORMAL HIGH (ref 70–99)
Glucose-Capillary: 99 mg/dL (ref 70–99)
Glucose-Capillary: 383 mg/dL — ABNORMAL HIGH (ref 70–99)
Glucose-Capillary: 180 mg/dL — ABNORMAL HIGH (ref 70–99)

## 2014-10-23 LAB — CBC
HCT: 39.9 % (ref 36.0–46.0)
Hemoglobin: 13.2 g/dL (ref 12.0–15.0)
MCH: 29.4 pg (ref 26.0–34.0)
MCHC: 33.1 g/dL (ref 30.0–36.0)
MCV: 88.9 fL (ref 78.0–100.0)
Platelets: 189 10*3/uL (ref 150–400)
RBC: 4.49 MIL/uL (ref 3.87–5.11)
RDW: 13 % (ref 11.5–15.5)
WBC: 8 10*3/uL (ref 4.0–10.5)

## 2014-10-23 LAB — HEMOGLOBIN A1C
Hgb A1c MFr Bld: 11.9 % — ABNORMAL HIGH (ref 4.8–5.6)
Mean Plasma Glucose: 295 mg/dL

## 2014-10-23 MED ORDER — GLUCERNA SHAKE PO LIQD
237.0000 mL | Freq: Two times a day (BID) | ORAL | Status: DC
  Administered 2014-10-23 – 2014-10-25 (×3): 237 mL via ORAL
  Filled 2014-10-23 (×5): qty 237

## 2014-10-23 MED ORDER — MUPIROCIN 2 % EX OINT
1.0000 "application " | TOPICAL_OINTMENT | Freq: Two times a day (BID) | CUTANEOUS | Status: DC
  Administered 2014-10-23 – 2014-10-25 (×4): 1 via NASAL
  Filled 2014-10-23: qty 22

## 2014-10-23 MED ORDER — INSULIN ASPART 100 UNIT/ML ~~LOC~~ SOLN
0.0000 [IU] | Freq: Three times a day (TID) | SUBCUTANEOUS | Status: DC
  Administered 2014-10-23: 15 [IU] via SUBCUTANEOUS
  Administered 2014-10-24 (×2): 3 [IU] via SUBCUTANEOUS
  Administered 2014-10-24: 8 [IU] via SUBCUTANEOUS
  Administered 2014-10-25: 3 [IU] via SUBCUTANEOUS

## 2014-10-23 MED ORDER — SALINE SPRAY 0.65 % NA SOLN
1.0000 | NASAL | Status: DC | PRN
  Administered 2014-10-23 – 2014-10-24 (×2): 1 via NASAL
  Filled 2014-10-23: qty 44

## 2014-10-23 MED ORDER — SODIUM CHLORIDE 0.9 % IV SOLN
INTRAVENOUS | Status: AC
  Administered 2014-10-23 – 2014-10-24 (×2): via INTRAVENOUS

## 2014-10-23 MED ORDER — SODIUM CHLORIDE 0.9 % IV SOLN
INTRAVENOUS | Status: DC
  Administered 2014-10-23: 11:00:00 via INTRAVENOUS

## 2014-10-23 MED ORDER — CHLORHEXIDINE GLUCONATE CLOTH 2 % EX PADS
6.0000 | MEDICATED_PAD | Freq: Every day | CUTANEOUS | Status: DC
  Administered 2014-10-23 – 2014-10-25 (×3): 6 via TOPICAL

## 2014-10-23 NOTE — Care Management Note (Addendum)
    Page 1 of 1   10/25/2014     10:41:11 AM CARE MANAGEMENT NOTE 10/25/2014  Patient:  [name] ParodyCARROLL,[name]   Account Number:  [id number]  Date Initiated:  10/23/2014  Documentation initiated by:  Lanier ClamMAHABIR,Leafy Motsinger  Subjective/Objective Assessment:   49 y/o f admitted w/DKA.     Action/Plan:   From home.No pcp.Has pharmacy.   Anticipated DC Date:  10/25/2014   Anticipated DC Plan:  HOME/SELF CARE      DC Planning Services  CM consult  [facility]      Choice offered to / List presented to:             Status of service:  Completed, signed off Medicare Important Message given?   (If response is "NO", the following Medicare IM given date fields will be blank) Date Medicare IM given:   Medicare IM given by:   Date Additional Medicare IM given:   Additional Medicare IM given by:    Discharge Disposition:  HOME/SELF CARE  Per UR Regulation:  Reviewed for med. necessity/level of care/duration of stay  If discussed at Long Length of Stay Meetings, dates discussed:    Comments:  10/25/14 Lanier ClamKathy Helayna Dun RN BSN NCM [phone] Spoke to patient about d/c plans.[facility] appt set for pcp f/u.Patient states she has no problem getting her meds from her pharmacy.Provided patient w/novolog discount coupon.No further d/c needs.  10/23/14 Lanier ClamKathy Journey Castonguay RN BSN NCM [phone] Provided w/pcp listing, encouraging [facility].

## 2014-10-23 NOTE — Progress Notes (Signed)
PT Cancellation Note  Patient Details Name: [name] MRN: [id number] DOB: July 10, 1966   Cancelled Treatment:    Reason Eval/Treat Not Completed: Patient at procedure or test/unavailable. Will check back later as schedule permits. Thanks.    Rebeca AlertJannie Amory Simonetti, MPT Pager: [phone]

## 2014-10-23 NOTE — Evaluation (Signed)
Physical Therapy Evaluation Patient Details Name: [name] MRN: [id number] DOB: 1966/04/19 Today's Date: 10/23/2014   History of Present Illness  49 yo female admitted wit DKA, R knee pain-pt stepped in whole, possible hyperextension injury. Hx of DKA, HTN, ESRD  Clinical Impression  On eval, pt required Min guard assist for mobility-able to ambulate ~100 feet without assistive device. Gait is antalgic. No LOB during session. Encouraged pt to follow up with MD as outpatient if pain persists/does not improve.     Follow Up Recommendations No PT follow up;Supervision for mobility/OOB    Equipment Recommendations  None recommended by PT    Recommendations for Other Services       Precautions / Restrictions Precautions Precautions: Fall Restrictions Weight Bearing Restrictions: No      Mobility  Bed Mobility Overal bed mobility: Independent                Transfers Overall transfer level: Needs assistance   Transfers: Sit to/from Stand Sit to Stand: Supervision            Ambulation/Gait Ambulation/Gait assistance: Min guard Ambulation Distance (Feet): 100 Feet Assistive device: None Gait Pattern/deviations: Antalgic;Decreased stride length;Decreased stance time - right;Step-through pattern     General Gait Details: slow gait speed. close guard for safety. No LOB during session. Did not note any knee buckling  Stairs            Wheelchair Mobility    Modified Rankin (Stroke Patients Only)       Balance                                             Pertinent Vitals/Pain Pain Assessment: 0-10 Pain Score: 7  Pain Location: R medial knee Pain Descriptors / Indicators: Sore;Aching Pain Intervention(s): Monitored during session;Repositioned;Ice applied    Home Living Family/patient expects to be discharged to:: Private residence Living Arrangements: Spouse/significant other;Children Available Help at Discharge:  Family Type of Home: Apartment Home Access: Stairs to enter   Secretary/administrator of Steps: 1   Home Equipment: None      Prior Function Level of Independence: Independent               Hand Dominance        Extremity/Trunk Assessment   Upper Extremity Assessment: Overall WFL for tasks assessed           Lower Extremity Assessment: Overall WFL for tasks assessed;RLE deficits/detail RLE Deficits / Details: tender to palpation medial aspect of knee, pain with patella mobilization and with knee flexion. pt able to maintain extended knee with SLR, knee ROM > or =90 degrees.     Cervical / Trunk Assessment: Normal  Communication   Communication: No difficulties  Cognition Arousal/Alertness: Awake/alert Behavior During Therapy: WFL for tasks assessed/performed Overall Cognitive Status: Within Functional Limits for tasks assessed                      General Comments      Exercises        Assessment/Plan    PT Assessment Patient needs continued PT services  PT Diagnosis Difficulty walking;Abnormality of gait;Acute pain   PT Problem List Decreased activity tolerance;Decreased balance;Decreased mobility;Pain  PT Treatment Interventions Gait training;Functional mobility training;Therapeutic activities;Therapeutic exercise;Patient/family education;Balance training   PT Goals (Current goals can be found in the Care Plan  section) Acute Rehab PT Goals Patient Stated Goal: less pain. home soon.  PT Goal Formulation: With patient Time For Goal Achievement: 11/06/14 Potential to Achieve Goals: Good    Frequency Min 3X/week   Barriers to discharge        Co-evaluation               End of Session Equipment Utilized During Treatment: Gait belt Activity Tolerance: Patient limited by pain Patient left: in bed;with call bell/phone within reach           Time: [id number] PT Time Calculation (min) (ACUTE ONLY): 15 min   Charges:   PT  Evaluation $Initial PT Evaluation Tier I: 1 Procedure     PT G Codes:        Rebeca AlertJannie Adain Geurin, MPT Pager: [phone]

## 2014-10-23 NOTE — Progress Notes (Signed)
CRITICAL VALUE ALERT  Critical value received:  MRSA positive  Date of notification:  10/22/14  Time of notification: 2359  Critical value read back:Yes.    Nurse who received alert:  Linward NatalLindsay Denym Christenberry  MD notified (1st page):  n/a  Time of first page:  n/a  MD notified (2nd page):  Time of second page:  Responding MD:  n/a  Time MD responded:  N/a  Patient put on MRSA positive standing orders

## 2014-10-23 NOTE — Progress Notes (Signed)
Nutrition Brief Note  Patient identified on the Malnutrition Screening Tool (MST) Report  Pt reports 30 lb weight loss. Per weight history pt's weight has remained stable. Per H&P, pt was vomiting 1 day PTA. Pt admitted with DKA, declines diet education at this time. Pt has had DM since she was 49 y.o. Pt reports no changes in her appetite. Pt's diet just advanced and pt has ordered lunch.  Pt requests Glucerna shakes, RD to order.  Wt Readings from Last 15 Encounters:  10/22/14 208 lb 8.9 oz (94.6 kg)  06/08/14 205 lb 11 oz (93.3 kg)  05/11/14 227 lb 11.2 oz (103.284 kg)  04/21/14 223 lb 12.8 oz (101.515 kg)  03/23/14 222 lb 3.6 oz (100.8 kg)  08/05/13 217 lb (98.431 kg)  06/07/13 223 lb (101.152 kg)  03/07/13 240 lb 11.9 oz (109.2 kg)  01/06/13 236 lb 5.3 oz (107.2 kg)  10/24/12 242 lb (109.77 kg)  08/08/12 243 lb 4.8 oz (110.36 kg)  07/04/12 244 lb (110.678 kg)  02/05/12 243 lb 11.2 oz (110.542 kg)  11/20/11 260 lb (117.935 kg)  09/23/11 257 lb 15 oz (117 kg)    Body mass index is 31.72 kg/(m^2). Patient meets criteria for obesity based on current BMI.   Current diet order is renal/CHO modified, diet just advanced. Labs and medications reviewed.   No further nutrition interventions warranted at this time. If nutrition issues arise, please consult RD.   Tilda FrancoLindsey Redmond Whittley, MS, RD, LDN Pager: [phone] After Hours Pager: [phone]

## 2014-10-23 NOTE — Progress Notes (Signed)
[facility] PROGRESS NOTE  [name] [id number] DOB: 1965/12/25 DOA: 10/22/2014 PCP: Irena CordsOLADONATO,JOSEPH A, MD  Assessment/Plan: #1 DKA Questionable etiology. May be likely secondary to noncompliance. Patient however states she's been taking insulin as prescribed. Patient does not have a PCP. Infectious workup negative. Acute abdominal series negative. Patient improvement in the symptoms. Anion gap is closed. Will transition patient to 7030 at 25 units twice daily. Change IV fluids. Advance diet carb modified diet. Patient was sliding scale insulin. Follow.  #2 nausea and emesis Likely secondary to problem #1.  #3 right knee pain Likely secondary to hyperextension. Pain management. He denies.  #4 history of renal transplant Baseline creatinine 1.2. Creatinine on admission was 1.79. Transplant was in 2011. Patient was previously on dialysis and none since transplant. Continue prednisone, CellCept, Prograf. Follow renal function. Saline lock IV fluids.  #5 hypertension Patient on admission was noted to be hypotensive. Improved with hydration. Blood pressure medications resume this morning. Follow.  #6 gastroesophageal reflux disease PPI.  #7 hypothyroidism Continue home dose Synthroid.  Code Status: Full Family Communication: Updated patient of family at bedside. Disposition Plan: Remain inpatient   Consultants:  None  Procedures:  Acute abdominal series 10/23/2014  Antibiotics:  None  HPI/Subjective: Patient denies any further nausea or vomiting. Patient denies any abdominal pain. Patient with no complaints.  Objective: Filed Vitals:   10/23/14 0509  BP: 136/58  Pulse: 88  Temp: 97.9 F (36.6 C)  Resp: 16    Intake/Output Summary (Last 24 hours) at 10/23/14 1019 Last data filed at 10/23/14 0002  Gross per 24 hour  Intake 304.17 ml  Output      0 ml  Net 304.17 ml   Filed Weights   10/22/14 2001  Weight: 94.6 kg (208 lb 8.9 oz)     Exam:   General:  NAD  Cardiovascular: RRR  Respiratory: CTAB  Abdomen: Soft, nontender, nondistended, positive bowel sounds.  Musculoskeletal: No clubbing cyanosis or edema.  Data Reviewed: Basic Metabolic Panel:  Recent Labs Lab 10/22/14 1616 10/22/14 1926 10/22/14 2345 10/23/14 0357 10/23/14 0650  NA 132* 138 138 137 136  K 4.7 4.5 4.3 3.9 4.1  CL 99 109 111 113* 109  CO2 11* 14* 17* 19 19  GLUCOSE 525* 395* 278* 158* 134*  BUN 45* 41* 41* 40* 36*  CREATININE 1.79* 1.54* 1.49* 1.44* 1.40*  CALCIUM 10.2 9.9 9.9 10.1 10.3  MG  --  1.8  --   --   --   PHOS  --  3.8  --   --   --    Liver Function Tests:  Recent Labs Lab 10/22/14 1616  AST 15  ALT 13  ALKPHOS 110  BILITOT 2.0*  PROT 7.0  ALBUMIN 3.6    Recent Labs Lab 10/22/14 1635  LIPASE 16   No results for input(s): AMMONIA in the last 168 hours. CBC:  Recent Labs Lab 10/22/14 1616 10/22/14 1926 10/23/14 0350  WBC 9.5 8.1 8.0  HGB 13.2 13.5 13.2  HCT 41.5 41.2 39.9  MCV 90.4 89.2 88.9  PLT 215 196 189   Cardiac Enzymes: No results for input(s): CKTOTAL, CKMB, CKMBINDEX, TROPONINI in the last 168 hours. BNP (last 3 results) No results for input(s): BNP in the last 8760 hours.  ProBNP (last 3 results) No results for input(s): PROBNP in the last 8760 hours.  CBG:  Recent Labs Lab 10/23/14 0505 10/23/14 0554 10/23/14 0650 10/23/14 0756 10/23/14 0906  GLUCAP 139* 131* 129* 136*  117*    Recent Results (from the past 240 hour(s))  MRSA PCR Screening     Status: Abnormal   Collection Time: 10/22/14 10:31 PM  Result Value Ref Range Status   MRSA by PCR POSITIVE (A) NEGATIVE Final    Comment:        The GeneXpert MRSA Assay (FDA approved for NASAL specimens only), is one component of a comprehensive MRSA colonization surveillance program. It is not intended to diagnose MRSA infection nor to guide or monitor treatment for MRSA infections. RESULT CALLED TO, READ BACK BY  AND VERIFIED WITH: L HUDSON RN 2359 10/22/14 A NAVARRO      Studies: Dg Abd Acute W/chest  10/23/2014   CLINICAL DATA:  Emesis.  EXAM: ACUTE ABDOMEN SERIES (ABDOMEN 2 VIEW & CHEST 1 VIEW)  COMPARISON:  Ultrasound 05/05/2014.  KUB 05/04/2014.  FINDINGS: No acute cardiopulmonary disease. Soft tissue structures are unremarkable. No bowel distention. No free air. No acute bony abnormality identified. Degenerative changes lumbar spine. Aortoiliac atherosclerotic vascular disease.  IMPRESSION: 1. No acute cardiopulmonary disease. No acute intra-abdominal abnormality identified. No bowel distention. 2. Atherosclerotic vascular disease.   Electronically Signed   By: Maisie Fus  Register   On: 10/23/2014 09:57    Scheduled Meds: . amLODipine  5 mg Oral Daily  . carvedilol  50 mg Oral BID WC  . Chlorhexidine Gluconate Cloth  6 each Topical Q0600  . famotidine  20 mg Oral Daily  . heparin  5,000 Units Subcutaneous 3 times per day  . insulin aspart protamine- aspart  25 Units Subcutaneous BID WC  . isosorbide-hydrALAZINE  1 tablet Oral BID  . metoCLOPramide (REGLAN) injection  10 mg Intravenous 3 times per day  . mupirocin ointment  1 application Nasal BID  . mycophenolate  500 mg Oral BID  . predniSONE  5 mg Oral Daily  . senna  1 tablet Oral BID  . sodium chloride  3 mL Intravenous Q12H  . tacrolimus  1 mg Oral BID   Continuous Infusions: . sodium chloride Stopped (10/23/14 0002)  . dextrose 5 % and 0.45% NaCl 125 mL/hr at 10/23/14 0900  . insulin (NOVOLIN-R) infusion 1.4 Units/hr (10/23/14 0657)    Principal Problem:   DKA, type 1 Active Problems:   Essential hypertension   H/O kidney transplant   High anion gap metabolic acidosis   Hyperglycemia   DKA (diabetic ketoacidoses)   Dehydration   Hyperextension injury of right knee   GERD without esophagitis   Hypothyroid   Acute-on-chronic kidney injury    Time spent: 40 minutes    Tyesha Joffe M.D. [facility] Pager  [phone]. If 7PM-7AM, please contact night-coverage at [email or web address], password [facility] 10/23/2014, 10:19 AM  LOS: 1 day

## 2014-10-23 NOTE — Progress Notes (Signed)
Patient had three blood sugars in target range. The BMET came back. NP was notified of results. NP called RN and stated to keep on drip until next BMET is drawn. Will report this to day RN.

## 2014-10-23 NOTE — Progress Notes (Signed)
Inpatient Diabetes Program Recommendations  AACE/ADA: New Consensus Statement on Inpatient Glycemic Control (2013)  Target Ranges:  Prepandial:   less than 140 mg/dL      Peak postprandial:   less than 180 mg/dL (1-2 hours)      Critically ill patients:  140 - 180 mg/dL   Reason for Visit: Diabetes Consult  Diabetes history: DM1 Outpatient Diabetes medications: Novolin 70/30 20 units bid Current orders for Inpatient glycemic control: Novolog 70/30 25 units bid, Novolog moderate tidwc  Results for [name], [name] (MRN [id number]) as of 10/23/2014 14:37  Ref. Range 10/23/2014 05:54 10/23/2014 06:50 10/23/2014 07:56 10/23/2014 09:06 10/23/2014 11:58  Glucose-Capillary Latest Range: 70-99 mg/dL 366131 (H) 440129 (H) 347136 (H) 117 (H) 99  Results for [name], [name] (MRN [id number]) as of 10/23/2014 14:37  Ref. Range 10/22/2014 19:27  Hemoglobin A1C Latest Range: 4.8-5.6 % 2211.779 (H)   49 year old female with past medical history of end-stage renal disease status post right renal transplant 2011, insulin-dependent diabetes who presents the ER complaining of hyperglycemia, nausea vomiting. Pt reports she had run out of glucose testing strips, therefore, unable to check blood sugars for past few days. Says she did not miss any insulin doses. Blood sugars uncontrolled at home. Spoke with patient about diabetes and home regimen for diabetes control. Patient reports she currently she takes 70/30 20 units bid  Inquired about knowledge about A1C and patient reports that she does not know what an A1C is. Discussed A1C results (11.9% on 01/20/14) and explained what an A1C is, basic pathophysiology of DM Type 2, basic home care, importance of checking CBGs and maintaining good CBG control to prevent long-term and short-term complications. Discussed impact of nutrition, exercise, stress, sickness, and medications on diabetes control. Patient verbalized understanding of information discussed and she states that she has no  further questions at this time related to diabetes.  Pt [facility]. Husband is patient there and she states she will make appt after discharge.  Agree with 70/30 25 units bid. When pt is discharged, please write prescription for Novolin 70/30 instead of Novolog. [facility] Will also need prescription for blood glucose test strips.  Thank you.Thank you. Ailene Ardshonda Glee Lashomb, RD, LDN, CDE Inpatient Diabetes Coordinator [phone]

## 2014-10-24 DIAGNOSIS — E0810 Diabetes mellitus due to underlying condition with ketoacidosis without coma: Secondary | ICD-10-CM

## 2014-10-24 LAB — GLUCOSE, CAPILLARY
GLUCOSE-CAPILLARY: 197 mg/dL — AB (ref 70–99)
Glucose-Capillary: 286 mg/dL — ABNORMAL HIGH (ref 70–99)
Glucose-Capillary: 166 mg/dL — ABNORMAL HIGH (ref 70–99)
Glucose-Capillary: 134 mg/dL — ABNORMAL HIGH (ref 70–99)

## 2014-10-24 LAB — CBC
HCT: 34.7 % — ABNORMAL LOW (ref 36.0–46.0)
HEMOGLOBIN: 11.2 g/dL — AB (ref 12.0–15.0)
MCH: 29.1 pg (ref 26.0–34.0)
MCHC: 32.3 g/dL (ref 30.0–36.0)
MCV: 90.1 fL (ref 78.0–100.0)
PLATELETS: 133 10*3/uL — AB (ref 150–400)
RBC: 3.85 MIL/uL — ABNORMAL LOW (ref 3.87–5.11)
RDW: 13.6 % (ref 11.5–15.5)
WBC: 3.7 10*3/uL — AB (ref 4.0–10.5)

## 2014-10-24 LAB — RENAL FUNCTION PANEL
ANION GAP: 6 (ref 5–15)
Albumin: 2.9 g/dL — ABNORMAL LOW (ref 3.5–5.2)
BUN: 33 mg/dL — ABNORMAL HIGH (ref 6–23)
CHLORIDE: 112 mmol/L (ref 96–112)
CO2: 19 mmol/L (ref 19–32)
Calcium: 10.5 mg/dL (ref 8.4–10.5)
Creatinine, Ser: 1.46 mg/dL — ABNORMAL HIGH (ref 0.50–1.10)
GFR calc non Af Amer: 42 mL/min — ABNORMAL LOW (ref >90)
GFR, EST AFRICAN AMERICAN: 48 mL/min — AB (ref >90)
GLUCOSE: 175 mg/dL — AB (ref 70–99)
POTASSIUM: 4.2 mmol/L (ref 3.5–5.1)
Phosphorus: 2.6 mg/dL (ref 2.3–4.6)
SODIUM: 137 mmol/L (ref 135–145)

## 2014-10-24 IMAGING — CR DG CHEST 2V
2 series · 2 of 2 positions shown · non-contrast
Comparison: 08/04/2012

CLINICAL DATA: Hyperglycemia, chest discomfort

CHEST - 2 VIEW

[w chest lat]
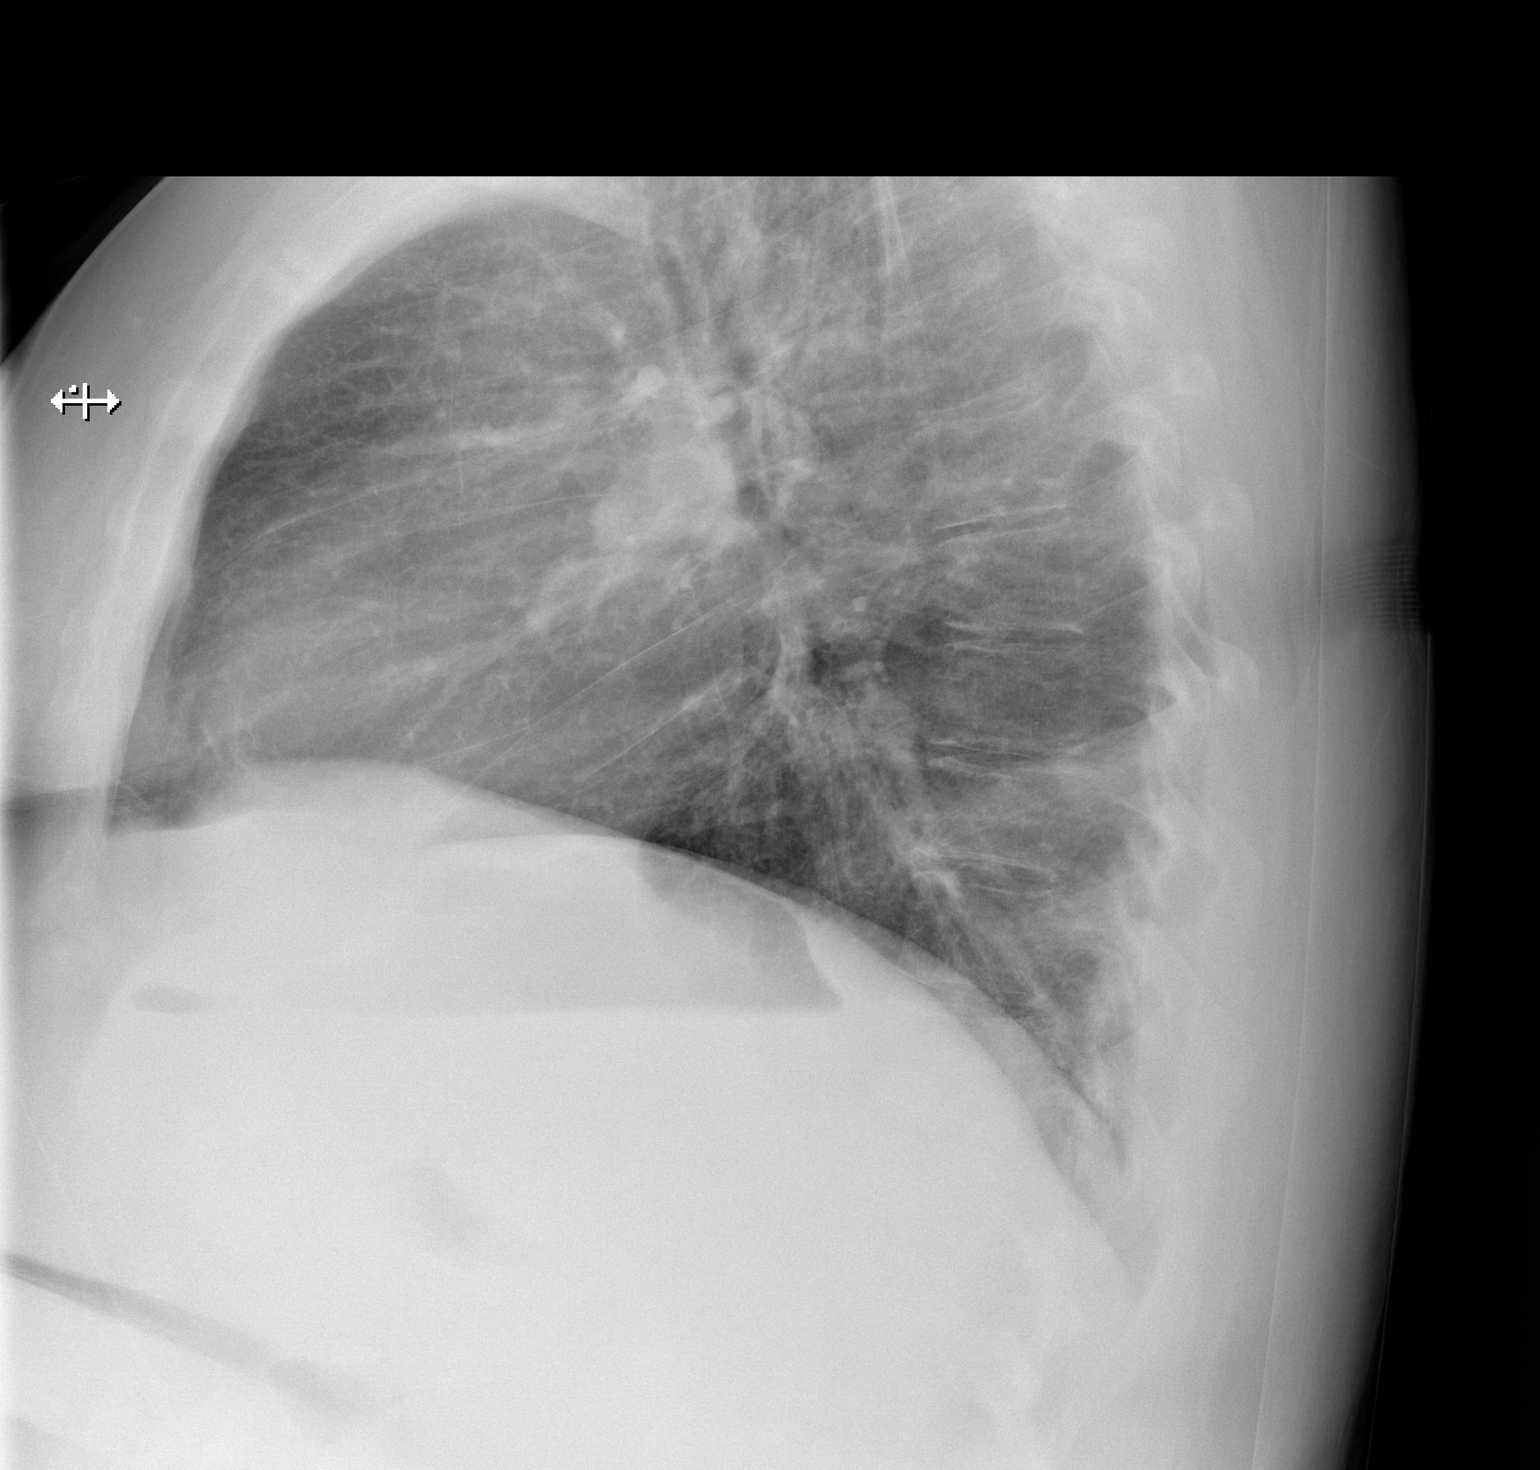

[x chest ap]
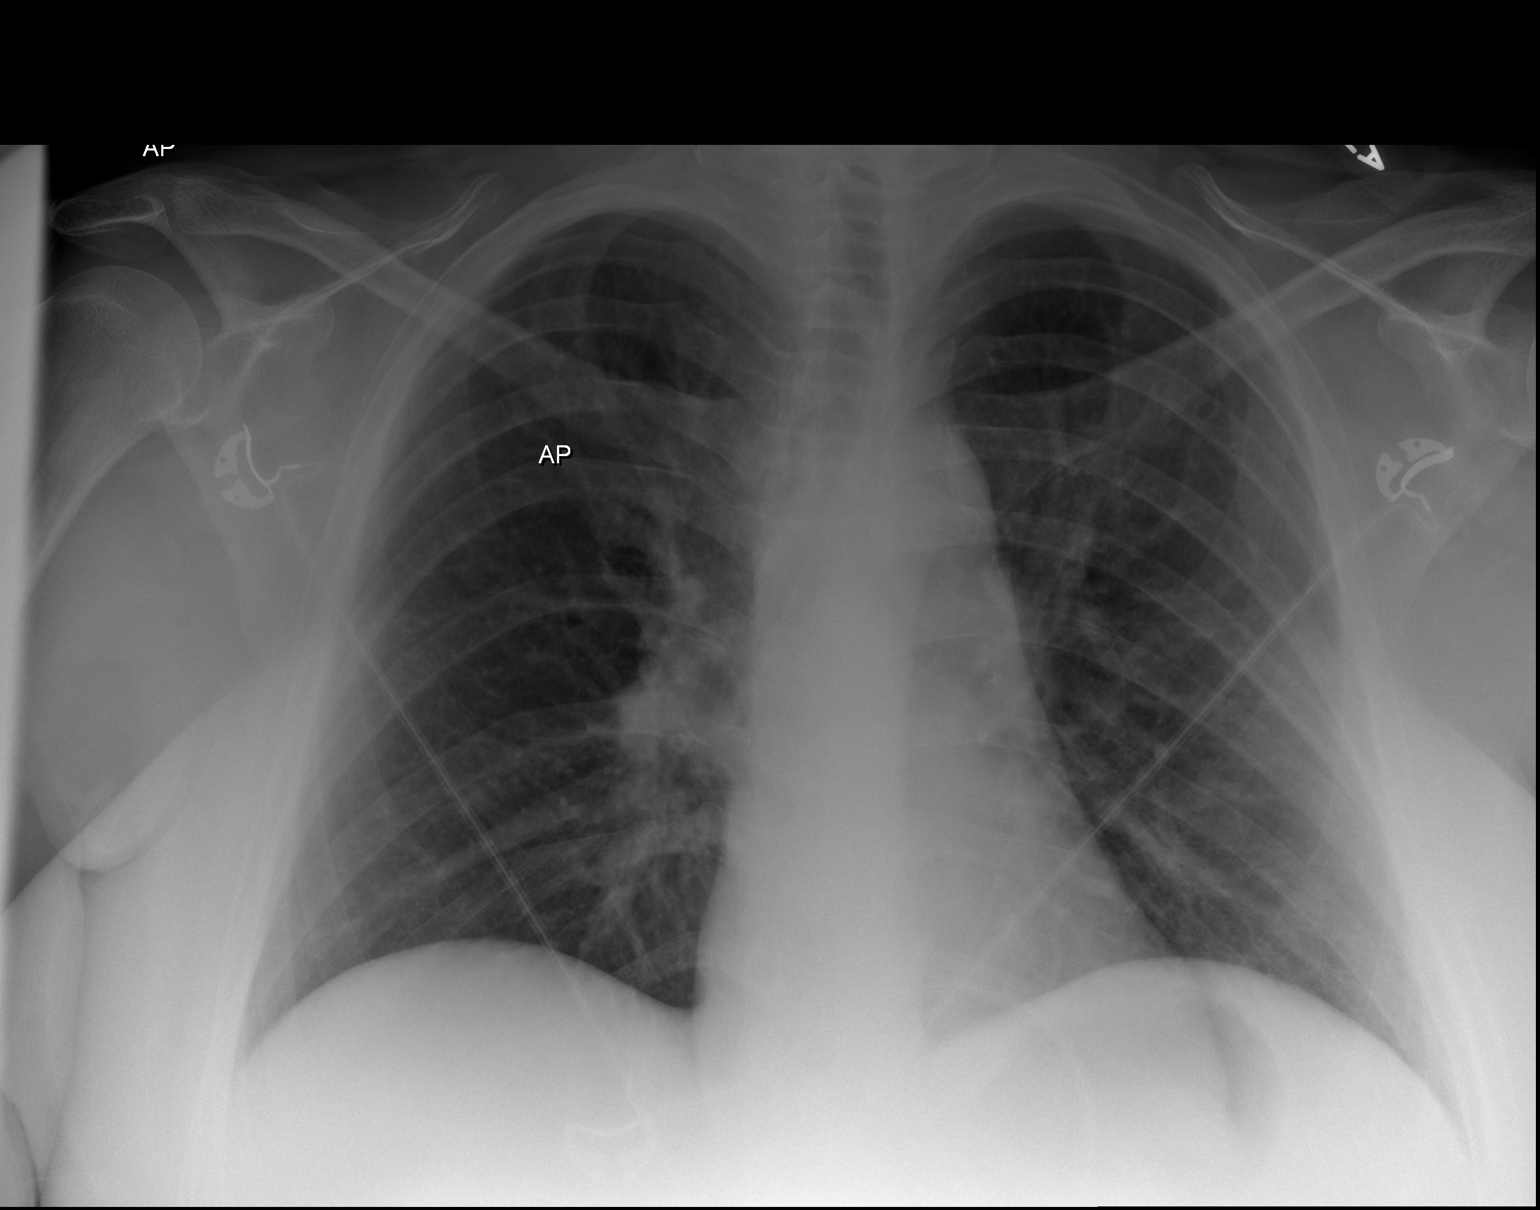

[2 of 2 positions shown; findings below may reference images not displayed]

FINDINGS: Cardiomediastinal silhouette is stable.  No acute
infiltrate or pleural effusion.  No pulmonary edema.  Bony thorax
is unremarkable.
IMPRESSION: No active disease.  No significant change.

## 2014-10-24 MED ORDER — INSULIN ASPART PROT & ASPART (70-30 MIX) 100 UNIT/ML ~~LOC~~ SUSP
5.0000 [IU] | Freq: Once | SUBCUTANEOUS | Status: AC
  Administered 2014-10-24: 5 [IU] via SUBCUTANEOUS

## 2014-10-24 MED ORDER — INSULIN NPH ISOPHANE & REGULAR (70-30) 100 UNIT/ML ~~LOC~~ SUSP: 25.0000 [IU] | Freq: Two times a day (BID) | SUBCUTANEOUS | Status: DC

## 2014-10-24 MED ORDER — SODIUM CHLORIDE 0.9 % IV BOLUS (SEPSIS)
250.0000 mL | Freq: Once | INTRAVENOUS | Status: AC
  Administered 2014-10-24: 250 mL via INTRAVENOUS

## 2014-10-24 MED ORDER — INSULIN ASPART PROT & ASPART (70-30 MIX) 100 UNIT/ML ~~LOC~~ SUSP
30.0000 [IU] | Freq: Two times a day (BID) | SUBCUTANEOUS | Status: DC
  Administered 2014-10-24 – 2014-10-25 (×2): 30 [IU] via SUBCUTANEOUS

## 2014-10-24 NOTE — Evaluation (Signed)
  Occupational Therapy Evaluation Patient Details Name: Exie Parodyamela S Palomo MRN: [id number] DOB: 11-29-1965 Today's Date: 10/24/2014    History of Present Illness 49 yo female admitted wit DKA, R knee pain-pt stepped in whole, possible hyperextension injury. Hx of DKA, HTN, ESRD   Clinical Impression   All education complete- no further OT needed       Equipment Recommendations  None recommended by OT    Recommendations for Other Services       Precautions / Restrictions Precautions Precautions: Fall Restrictions Weight Bearing Restrictions: No      Mobility Bed Mobility Overal bed mobility: Independent                Transfers Overall transfer level: Needs assistance     Sit to Stand: Supervision              Balance                                            ADL Overall ADL's : At baseline                                             Vision     Perception     Praxis      Pertinent Vitals/Pain Pain Score: 3  Pain Location: head Pain Intervention(s): Limited activity within patient's tolerance;Repositioned     Hand Dominance     Extremity/Trunk Assessment Upper Extremity Assessment Upper Extremity Assessment: Overall WFL for tasks assessed           Communication Communication Communication: No difficulties   Cognition Arousal/Alertness: Awake/alert Behavior During Therapy: WFL for tasks assessed/performed Overall Cognitive Status: Within Functional Limits for tasks assessed                     General Comments   husband and son will A as needed.            Home Living Family/patient expects to be discharged to:: Private residence Living Arrangements: Spouse/significant other;Children Available Help at Discharge: Family Type of Home: Apartment Home Access: Stairs to enter Secretary/administratorntrance Stairs-Number of Steps: 1   Home Layout: Two level   Alternate Level Stairs-Rails:  Right Bathroom Shower/Tub: Producer, television/film/videoWalk-in shower   Bathroom Toilet: Standard     Home Equipment: Shower seat          Prior Functioning/Environment Level of Independence: Independent                      OT Goals(Current goals can be found in the care plan section) Acute Rehab OT Goals Patient Stated Goal: less pain. home soon.   OT Frequency:                End of Session Nurse Communication: Mobility status  Activity Tolerance: Patient tolerated treatment well Patient left: in chair;with call bell/phone within reach   Time: [id number] OT Time Calculation (min): 9 min Charges:  OT General Charges $OT Visit: 1 Procedure OT Evaluation $Initial OT Evaluation Tier I: 1 Procedure G-Codes:    Alba CoryEDDING, Hannia Matchett D 10/24/2014, 11:33 AM

## 2014-10-24 NOTE — Progress Notes (Signed)
[facility] PROGRESS NOTE  [name] UJW:[id number] DOB: 07-24-1966 DOA: 10/22/2014 PCP: Irena CordsOLADONATO,JOSEPH A, MD  Assessment/Plan: 1. DKA with DM1 1. DKA resolved 2. Agree with Diabetic Coordinator to cont on BID 70/30 insulin 3. Cont on SSI coverage 4. Cont to advise compliance with meds and diet 2. Nausea/Vomiting 1. Improving 2. Suspect secondary to recent DKA 3. Personally reviewed recent xrays and there are no signs of obstruction or significant stool 4. Cont supportive care 3. R knee pain 1. Analgesics as tolerated 4. Hx renal transplant 1. Renal function stable 2. Cont to hydrate as pt reports feeling thirsty and weak 5. HTN 1. BP stable and controlled 6. GERD 1. On PPI 2. Stable 7. Hypothyroid 1. Cont with home thyroid replacement 2. Stable 8. DVT prophylaxis 1. Continue heparin subQ  Code Status: Full Family Communication: Pt in room (indicate person spoken with, relationship, and if by phone, the number) Disposition Plan: Pending   Consultants:    Procedures:    Antibiotics:   (indicate start date, and stop date if known)  HPI/Subjective: Felt thirsty with dry mouth this AM. Feels better with IVF  Objective: Filed Vitals:   10/23/14 1424 10/23/14 2122 10/24/14 0611 10/24/14 1400  BP: 122/51 123/53 136/63 124/59  Pulse: 85 79 80 75  Temp: 97.6 F (36.4 C) 97.9 F (36.6 C) 97.9 F (36.6 C) 97.7 F (36.5 C)  TempSrc: Oral Oral Oral Oral  Resp: 17 16 16 16   Height:      Weight:      SpO2: 98% 94% 93% 95%    Intake/Output Summary (Last 24 hours) at 10/24/14 1711 Last data filed at 10/24/14 1500  Gross per 24 hour  Intake 1854.17 ml  Output      1 ml  Net 1853.17 ml   Filed Weights   10/22/14 2001  Weight: 94.6 kg (208 lb 8.9 oz)    Exam:   General:  Awake, in nad, dry mucus membranes  Cardiovascular: regular, s1, s2  Respiratory: normal resp effort, no wheezing  Abdomen: soft,nondistended  Musculoskeletal:  perfused,no clubbing   Data Reviewed: Basic Metabolic Panel:  Recent Labs Lab 10/22/14 1926 10/22/14 2345 10/23/14 0357 10/23/14 0650 10/24/14 0425  NA 138 138 137 136 137  K 4.5 4.3 3.9 4.1 4.2  CL 109 111 113* 109 112  CO2 14* 17* 19 19 19   GLUCOSE 395* 278* 158* 134* 175*  BUN 41* 41* 40* 36* 33*  CREATININE 1.54* 1.49* 1.44* 1.40* 1.46*  CALCIUM 9.9 9.9 10.1 10.3 10.5  MG 1.8  --   --   --   --   PHOS 3.8  --   --   --  2.6   Liver Function Tests:  Recent Labs Lab 10/22/14 1616 10/24/14 0425  AST 15  --   ALT 13  --   ALKPHOS 110  --   BILITOT 2.0*  --   PROT 7.0  --   ALBUMIN 3.6 2.9*    Recent Labs Lab 10/22/14 1635  LIPASE 16   No results for input(s): AMMONIA in the last 168 hours. CBC:  Recent Labs Lab 10/22/14 1616 10/22/14 1926 10/23/14 0350 10/24/14 0425  WBC 9.5 8.1 8.0 3.7*  HGB 13.2 13.5 13.2 11.2*  HCT 41.5 41.2 39.9 34.7*  MCV 90.4 89.2 88.9 90.1  PLT 215 196 189 133*   Cardiac Enzymes: No results for input(s): CKTOTAL, CKMB, CKMBINDEX, TROPONINI in the last 168 hours. BNP (last 3 results) No results for  input(s): BNP in the last 8760 hours.  ProBNP (last 3 results) No results for input(s): PROBNP in the last 8760 hours.  CBG:  Recent Labs Lab 10/23/14 1158 10/23/14 1643 10/23/14 2130 10/24/14 0740 10/24/14 1154  GLUCAP 99 383* 180* 197* 286*    Recent Results (from the past 240 hour(s))  MRSA PCR Screening     Status: Abnormal   Collection Time: 10/22/14 10:31 PM  Result Value Ref Range Status   MRSA by PCR POSITIVE (A) NEGATIVE Final    Comment:        The GeneXpert MRSA Assay (FDA approved for NASAL specimens only), is one component of a comprehensive MRSA colonization surveillance program. It is not intended to diagnose MRSA infection nor to guide or monitor treatment for MRSA infections. RESULT CALLED TO, READ BACK BY AND VERIFIED WITH: L HUDSON RN 2359 10/22/14 A NAVARRO      Studies: Dg Abd Acute  W/chest  10/23/2014   CLINICAL DATA:  Emesis.  EXAM: ACUTE ABDOMEN SERIES (ABDOMEN 2 VIEW & CHEST 1 VIEW)  COMPARISON:  Ultrasound 05/05/2014.  KUB 05/04/2014.  FINDINGS: No acute cardiopulmonary disease. Soft tissue structures are unremarkable. No bowel distention. No free air. No acute bony abnormality identified. Degenerative changes lumbar spine. Aortoiliac atherosclerotic vascular disease.  IMPRESSION: 1. No acute cardiopulmonary disease. No acute intra-abdominal abnormality identified. No bowel distention. 2. Atherosclerotic vascular disease.   Electronically Signed   By: Maisie Fus  Register   On: 10/23/2014 09:57    Scheduled Meds: . amLODipine  5 mg Oral Daily  . carvedilol  50 mg Oral BID WC  . Chlorhexidine Gluconate Cloth  6 each Topical Q0600  . famotidine  20 mg Oral Daily  . feeding supplement (GLUCERNA SHAKE)  237 mL Oral BID BM  . heparin  5,000 Units Subcutaneous 3 times per day  . insulin aspart  0-15 Units Subcutaneous TID WC  . insulin aspart protamine- aspart  30 Units Subcutaneous BID WC  . isosorbide-hydrALAZINE  1 tablet Oral BID  . mupirocin ointment  1 application Nasal BID  . mycophenolate  500 mg Oral BID  . predniSONE  5 mg Oral Daily  . senna  1 tablet Oral BID  . sodium chloride  3 mL Intravenous Q12H  . tacrolimus  1 mg Oral BID   Continuous Infusions: . sodium chloride 100 mL/hr at 10/24/14 1218    Principal Problem:   DKA, type 1 Active Problems:   Essential hypertension   H/O kidney transplant   High anion gap metabolic acidosis   Hyperglycemia   DKA (diabetic ketoacidoses)   Dehydration   Hyperextension injury of right knee   GERD without esophagitis   Hypothyroid   Acute-on-chronic kidney injury   CHIU, STEPHEN K  [facility] Pager [phone]. If 7PM-7AM, please contact night-coverage at [email or web address], password [facility] 10/24/2014, 5:11 PM  LOS: 2 days

## 2014-10-25 LAB — BASIC METABOLIC PANEL
ANION GAP: 4 — AB (ref 5–15)
BUN: 22 mg/dL (ref 6–23)
CALCIUM: 9.8 mg/dL (ref 8.4–10.5)
CHLORIDE: 113 mmol/L — AB (ref 96–112)
CO2: 19 mmol/L (ref 19–32)
Creatinine, Ser: 1.16 mg/dL — ABNORMAL HIGH (ref 0.50–1.10)
GFR calc non Af Amer: 55 mL/min — ABNORMAL LOW (ref >90)
GFR, EST AFRICAN AMERICAN: 64 mL/min — AB (ref >90)
Glucose, Bld: 128 mg/dL — ABNORMAL HIGH (ref 70–99)
Potassium: 4.3 mmol/L (ref 3.5–5.1)
Sodium: 136 mmol/L (ref 135–145)

## 2014-10-25 LAB — GLUCOSE, CAPILLARY: Glucose-Capillary: 200 mg/dL — ABNORMAL HIGH (ref 70–99)

## 2014-10-25 MED ORDER — INSULIN NPH ISOPHANE & REGULAR (70-30) 100 UNIT/ML ~~LOC~~ SUSP: 30.0000 [IU] | Freq: Two times a day (BID) | SUBCUTANEOUS | Status: DC

## 2014-10-25 NOTE — Discharge Summary (Signed)
Physician Discharge Summary  [name]:[id number] DOB: 27-Oct-1965 DOA: 10/22/2014  PCP: [name], MD  Admit date: 10/22/2014 Discharge date: 10/25/2014  Time spent: 25 minutes  Recommendations for Outpatient Follow-up:  1. Follow up with PCP in 1-2 weeks  Discharge Diagnoses:  Principal Problem:   DKA, type 1 Active Problems:   Essential hypertension   H/O kidney transplant   High anion gap metabolic acidosis   Hyperglycemia   DKA (diabetic ketoacidoses)   Dehydration   Hyperextension injury of right knee   GERD without esophagitis   Hypothyroid   Acute-on-chronic kidney injury   Discharge Condition: Improved  Diet recommendation: Diabetic  Filed Weights   10/22/14 2001  Weight: 94.6 kg (208 lb 8.9 oz)    History of present illness:  Please see h and p from 2/15 for details. Briefly, pt presents with nausea/vomiting with marked hyperglycemia and metabolic acidosis, ultimately being admitted for DKA.  Hospital Course:  1. DKA with DM1 1. DKA resolved with insulin gtt and successful transition to subQ insulin 2. Agree with Diabetic Coordinator to cont on BID 70/30 insulin on discharge, 30 units bid 3. Cont on SSI coverage while inpatient 4. Cont to advise compliance with meds and diet 2. Nausea/Vomiting 1. Improved 2. Suspect secondary to recent DKA 3. Personally reviewed recent xrays and there are no signs of obstruction or significant stool burden 3. R knee pain 1. Analgesics as tolerated 4. Hx renal transplant 1. Renal function improved with hydration 5. HTN 1. BP stable and controlled 6. GERD 1. On PPI 2. Stable 7. Hypothyroid 1. Cont with home thyroid replacement 2. Stable 8. DVT prophylaxis 1. Continue heparin subQ while inpatient   Consultations:  Diabetic Coordinator  Discharge Exam: Filed Vitals:   10/24/14 0611 10/24/14 1400 10/24/14 2127 10/25/14 0617  BP: 136/63 124/59 149/74 148/74  Pulse: 80 75 72 73  Temp: 97.9  F (36.6 C) 97.7 F (36.5 C) 97.6 F (36.4 C) 97.6 F (36.4 C)  TempSrc: Oral Oral Oral Oral  Resp: Height:      Weight:      SpO2: 93% 95% 94% 99%    General: Awake, in nad Cardiovascular: regular, s1, s2 Respiratory: normal resp effort, no wheezing  Discharge Instructions     Medication List    STOP taking these medications        insulin aspart protamine- aspart (70-30) 100 UNIT/ML injection  Commonly known as:  NOVOLOG MIX 70/30     sodium bicarbonate 650 MG tablet      TAKE these medications        acetaminophen 500 MG tablet  Commonly known as:  TYLENOL  Take 500-1,000 mg by mouth every 6 (six) hours as needed for headache.     amLODipine 5 MG tablet  Commonly known as:  NORVASC  Take 1 tablet (5 mg total) by mouth daily.     carvedilol 25 MG tablet  Commonly known as:  COREG  Take 2 tablets (50 mg total) by mouth 2 (two) times daily.     insulin NPH-regular Human (70-30) 100 UNIT/ML injection  Commonly known as:  NOVOLIN 70/30  Inject 30 Units into the skin 2 (two) times daily with a meal.     isosorbide-hydrALAZINE 20-37.5 MG per tablet  Commonly known as:  BIDIL  Take 1 tablet by mouth 2 (two) times daily.     levothyroxine 137 MCG tablet  Commonly known as:  SYNTHROID, LEVOTHROID  Take  137 mcg by mouth daily. Name brand     multivitamin with minerals Tabs tablet  Take 1 tablet by mouth daily.     mycophenolate 250 MG capsule  Commonly known as:  CELLCEPT  Take 500 mg by mouth 2 (two) times daily.     predniSONE 5 MG tablet  Commonly known as:  DELTASONE  Take 5 mg by mouth daily.     ranitidine 150 MG capsule  Commonly known as:  ZANTAC  Take 150 mg by mouth daily.     tacrolimus 1 MG capsule  Commonly known as:  PROGRAF  Take 1 mg by mouth 2 (two) times daily.       Allergies  Allergen Reactions  . Ace Inhibitors Other (See Comments)    Cough   ( Lisinopril )  . Adhesive [Tape] Itching   Follow-up Information     Follow up with COLADONATO,JOSEPH A, MD. Schedule an appointment as soon as possible for a visit in 1 week.   Specialty:  Nephrology   Contact information:   [address] [place] [address] [phone]       Follow up with [facility]     On 10/29/2014.   Why:  8:45a/$20 co pay/bring meds in bottle/photo id   Contact information:   [address] [facility] [address] [phone]       The results of significant diagnostics from this hospitalization (including imaging, microbiology, ancillary and laboratory) are listed below for reference.    Significant Diagnostic Studies: Dg Abd Acute W/chest  10/23/2014   CLINICAL DATA:  Emesis.  EXAM: ACUTE ABDOMEN SERIES (ABDOMEN 2 VIEW & CHEST 1 VIEW)  COMPARISON:  Ultrasound 05/05/2014.  KUB 05/04/2014.  FINDINGS: No acute cardiopulmonary disease. Soft tissue structures are unremarkable. No bowel distention. No free air. No acute bony abnormality identified. Degenerative changes lumbar spine. Aortoiliac atherosclerotic vascular disease.  IMPRESSION: 1. No acute cardiopulmonary disease. No acute intra-abdominal abnormality identified. No bowel distention. 2. Atherosclerotic vascular disease.   Electronically Signed   By: Maisie Fus  Register   On: 10/23/2014 09:57    Microbiology: Recent Results (from the past 240 hour(s))  MRSA PCR Screening     Status: Abnormal   Collection Time: 10/22/14 10:31 PM  Result Value Ref Range Status   MRSA by PCR POSITIVE (A) NEGATIVE Final    Comment:        The GeneXpert MRSA Assay (FDA approved for NASAL specimens only), is one component of a comprehensive MRSA colonization surveillance program. It is not intended to diagnose MRSA infection nor to guide or monitor treatment for MRSA infections. RESULT CALLED TO, READ BACK BY AND VERIFIED WITH: L HUDSON RN 2359 10/22/14 A NAVARRO      Labs: Basic Metabolic Panel:  Recent Labs Lab 10/22/14 1926  10/22/14 2345 10/23/14 0357 10/23/14 0650 10/24/14 0425 10/25/14 0501  NA 138 138 137 136 137 136  K 4.5 4.3 3.9 4.1 4.2 4.3  CL 109 111 113* 109 112 113*  CO2 14* 17* GLUCOSE 395* 278* 158* 134* 175* 128*  BUN 41* 41* 40* 36* 33* 22  CREATININE 1.54* 1.49* 1.44* 1.40* 1.46* 1.16*  CALCIUM 9.9 9.9 10.1 10.3 10.5 9.8  MG 1.8  --   --   --   --   --   PHOS 3.8  --   --   --  2.6  --    Liver Function Tests:  Recent Labs Lab 10/22/14 1616 10/24/14 0425  AST 15  --   ALT 13  --   ALKPHOS 110  --   BILITOT 2.0*  --   PROT 7.0  --   ALBUMIN 3.6 2.9*    Recent Labs Lab 10/22/14 1635  LIPASE 16   No results for input(s): AMMONIA in the last 168 hours. CBC:  Recent Labs Lab 10/22/14 1616 10/22/14 1926 10/23/14 0350 10/24/14 0425  WBC 9.5 8.1 8.0 3.7*  HGB 13.2 13.5 13.2 11.2*  HCT 41.5 41.2 39.9 34.7*  MCV 90.4 89.2 88.9 90.1  PLT 215 196 189 133*   Cardiac Enzymes: No results for input(s): CKTOTAL, CKMB, CKMBINDEX, TROPONINI in the last 168 hours. BNP: BNP (last 3 results) No results for input(s): BNP in the last 8760 hours.  ProBNP (last 3 results) No results for input(s): PROBNP in the last 8760 hours.  CBG:  Recent Labs Lab 10/24/14 0740 10/24/14 1154 10/24/14 1659 10/24/14 2124 10/25/14 0747  GLUCAP 197* 286* 166* 134* 200*    Signed:  Jniya Madara K  [facility] 10/25/2014, 9:26 AM

## 2014-10-25 NOTE — Progress Notes (Signed)
I completed discharge teaching with patient. Patient was discharged home with family in stable condition.

## 2014-10-29 ENCOUNTER — Ambulatory Visit: Payer: Medicare Other | Attending: Internal Medicine | Admitting: Physician Assistant

## 2014-10-29 VITALS — BP 144/86 | HR 76 | Temp 97.6°F | Resp 18 | Ht 69.0 in | Wt 215.2 lb

## 2014-10-29 DIAGNOSIS — E1021 Type 1 diabetes mellitus with diabetic nephropathy: Secondary | ICD-10-CM

## 2014-10-29 DIAGNOSIS — Z940 Kidney transplant status: Secondary | ICD-10-CM | POA: Diagnosis not present

## 2014-10-29 DIAGNOSIS — N186 End stage renal disease: Secondary | ICD-10-CM | POA: Diagnosis not present

## 2014-10-29 DIAGNOSIS — Z7952 Long term (current) use of systemic steroids: Secondary | ICD-10-CM | POA: Diagnosis not present

## 2014-10-29 DIAGNOSIS — H6122 Impacted cerumen, left ear: Secondary | ICD-10-CM | POA: Diagnosis not present

## 2014-10-29 DIAGNOSIS — Z905 Acquired absence of kidney: Secondary | ICD-10-CM | POA: Diagnosis not present

## 2014-10-29 DIAGNOSIS — E1165 Type 2 diabetes mellitus with hyperglycemia: Secondary | ICD-10-CM | POA: Insufficient documentation

## 2014-10-29 DIAGNOSIS — Z89412 Acquired absence of left great toe: Secondary | ICD-10-CM | POA: Insufficient documentation

## 2014-10-29 DIAGNOSIS — Z794 Long term (current) use of insulin: Secondary | ICD-10-CM | POA: Diagnosis not present

## 2014-10-29 DIAGNOSIS — E039 Hypothyroidism, unspecified: Secondary | ICD-10-CM | POA: Diagnosis not present

## 2014-10-29 DIAGNOSIS — I120 Hypertensive chronic kidney disease with stage 5 chronic kidney disease or end stage renal disease: Secondary | ICD-10-CM | POA: Insufficient documentation

## 2014-10-29 LAB — COMPREHENSIVE METABOLIC PANEL
ALBUMIN: 3.5 g/dL (ref 3.5–5.2)
ALT: 24 U/L (ref 0–35)
AST: 27 U/L (ref 0–37)
Alkaline Phosphatase: 104 U/L (ref 39–117)
BILIRUBIN TOTAL: 0.4 mg/dL (ref 0.2–1.2)
BUN: 21 mg/dL (ref 6–23)
CHLORIDE: 102 meq/L (ref 96–112)
CO2: 28 mEq/L (ref 19–32)
Calcium: 10.3 mg/dL (ref 8.4–10.5)
Creat: 1.07 mg/dL (ref 0.50–1.10)
Glucose, Bld: 157 mg/dL — ABNORMAL HIGH (ref 70–99)
POTASSIUM: 4.1 meq/L (ref 3.5–5.3)
Sodium: 141 mEq/L (ref 135–145)
Total Protein: 6.5 g/dL (ref 6.0–8.3)

## 2014-10-29 LAB — GLUCOSE, POCT (MANUAL RESULT ENTRY): POC Glucose: 158 mg/dl — AB (ref 70–99)

## 2014-10-29 MED ORDER — CARBAMIDE PEROXIDE 6.5 % OT SOLN: 5.0000 [drp] | Freq: Two times a day (BID) | OTIC | Status: DC

## 2014-10-29 NOTE — Progress Notes (Signed)
Pt presents to clinic follow up after hospitalization for DKA. Pt reports chronic back knee pain.

## 2014-10-29 NOTE — Progress Notes (Addendum)
[name]  AOZ:[id number]  NGE:[id number]  DOB - March 19, 1966  Chief Complaint  Patient presents with  . Follow-up  . Diabetes       Subjective:   [name] is a 49 y.o. female here today for establishment of care. She was hospitalized from February 15 10/25/2014 for nausea, vomiting, and elevated blood glucose. She her sugar was around 600 on presentation. She also was positive for metabolic acidosis. She was diagnosed with DKA and started on insulin drip and hydrated. Her creatinine on presentation was 1.79. Her baseline creatinine is 1.2. She had kidney transplant 2011.[name]   Since discharge she has been taking her increased dose of insulin. 30 units twice daily. She checks her blood glucose at least twice daily. Her numbers are around 250 on average. She did not take it this morning.  She also complains of a left ear throbbing sensation and not being able to hear. She believes that she has wax buildup.    ROS: GEN: denies fever or chills, denies change in weight Skin: denies lesions or rashes HEENT: denies headache, +earache and difficulty hearing, epistaxis, sore throat, or neck pain LUNGS: denies SHOB, dyspnea, PND, orthopnea CV: denies CP or palpitations ABD: denies abd pain, N or V EXT: denies muscle spasms or swelling; no pain in lower ext, no weakness NEURO: denies numbness or tingling, denies sz, stroke or TIA  ALLERGIES: Allergies  Allergen Reactions  . Ace Inhibitors Other (See Comments)    Cough   ( Lisinopril )  . Adhesive [Tape] Itching    PAST MEDICAL HISTORY: Past Medical History  Diagnosis Date  . Hypertension   . Hypothyroid   . Depression   . 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 Kidney disease   . End stage renal disease     right arm AV graft, post transplant  . Diabetes mellitus     Insulin dependant    PAST SURGICAL HISTORY: Past Surgical History  Procedure Laterality Date  . Back surgery  2001  . Tubal  ligation    . Dg av dialysis graft declot or    . Nephrectomy transplanted organ    . Kidney transplant  December 16, 2009  . 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  . Cataract extraction w/ intraocular lens implant Right   . Amputation Left 05/11/2014    Procedure: AMPUTATION LEFT GREAT TOE;  Surgeon: Kathryne Hitch, MD;  Location: [facility];  Service: Orthopedics;  Laterality: Left;    MEDICATIONS AT HOME: Prior to Admission medications   Medication Sig Start Date End Date Taking? Authorizing Provider  acetaminophen (TYLENOL) 500 MG tablet Take 500-1,000 mg by mouth every 6 (six) hours as needed for headache.    Historical Provider, MD  amLODipine (NORVASC) 5 MG tablet Take 1 tablet (5 mg total) by mouth daily. 08/08/12   Alison Murray, MD  carvedilol (COREG) 25 MG tablet Take 2 tablets (50 mg total) by mouth 2 (two) times daily. 08/08/12   Alison Murray, MD  insulin NPH-regular Human (NOVOLIN 70/30) (70-30) 100 UNIT/ML injection Inject 30 Units into the skin 2 (two) times daily with a meal. 10/25/14   Jerald Kief, MD  isosorbide-hydrALAZINE (BIDIL) 20-37.5 MG per tablet Take 1 tablet by mouth 2 (two) times daily. 05/12/14   Catarina Hartshorn, MD  levothyroxine (SYNTHROID, LEVOTHROID) 137 MCG tablet Take 137 mcg by mouth daily. Name brand    Historical Provider, MD  Multiple Vitamin (MULTIVITAMIN  WITH MINERALS) TABS tablet Take 1 tablet by mouth daily.    Historical Provider, MD  mycophenolate (CELLCEPT) 250 MG capsule Take 500 mg by mouth 2 (two) times daily.     Historical Provider, MD  predniSONE (DELTASONE) 5 MG tablet Take 5 mg by mouth daily.      Historical Provider, MD  ranitidine (ZANTAC) 150 MG capsule Take 150 mg by mouth daily.     Historical Provider, MD  tacrolimus (PROGRAF) 1 MG capsule Take 1 mg by mouth 2 (two) times daily.     Historical Provider, MD     Objective:   Filed Vitals:   10/29/14 0934  BP: 144/86  Pulse: 76  Temp: 97.6 F (36.4 C)  TempSrc: Oral  Resp: 18    Height: 5\' 9"  (1.753 m)  Weight: 215 lb 3.2 oz (97.614 kg)  SpO2: 98%    Exam General appearance : Awake, alert, not in any distress. Speech Clear. Not toxic looking HEENT: Atraumatic and Normocephalic, pupils equally reactive to light and accomodation. Cerumen impaction on the left.  Neck: supple, no JVD. No cervical lymphadenopathy.  Extremities: B/L Lower Ext shows no edema, both legs are warm to touch Neurology: Awake alert, and oriented X 3, CN II-XII intact, Non focal Skin:No Rash Wounds:N/A  Data Review Lab Results  Component Value Date   HGBA1C 11.9* 10/22/2014   HGBA1C 11.5* 03/22/2014   HGBA1C 12.2* 06/05/2013     Assessment & Plan  1.  Insulin-dependent diabetes mellitus, uncontrolled  -Continue 70/30 30 units twice daily  -Keep a fingerstick log so further adjustments can be made in 2 weeks  -Check CMP and blood glucose today 2. Left cerumen impaction  -Ear lavage on the left  -Debrox drops 3. End-stage renal disease; status post transplanted 2011/immunosuppressed  -Check CMP  -Keep appointment this month with nephrology   Return in about 2 weeks (around 11/12/2014).  The patient was given clear instructions to go to ER or return to medical center if symptoms don't improve, worsen or new problems develop. The patient verbalized understanding. The patient was told to call to get lab results if they haven't heard anything in the next week.   This note has been created with Education officer, environmentalDragon speech recognition software and smart phrase technology. Any transcriptional errors are unintentional.    Scot Juniffany Raciel Caffrey, PA-C [facility] [place], [place] [phone]   10/29/2014, 10:37 AM

## 2014-10-29 NOTE — Addendum Note (Signed)
Addended byVivianne Master: Nikko Quast S on: 10/29/2014 10:44 AM   Modules accepted: Orders

## 2014-12-21 IMAGING — CR DG CHEST 1V PORT
1 series · 1 of 1 positions shown · non-contrast
Comparison: 01/05/2013

CLINICAL DATA: Weakness.  Dehydration.  Cough.  Diabetic.

PORTABLE CHEST - 1 VIEW

[AP]
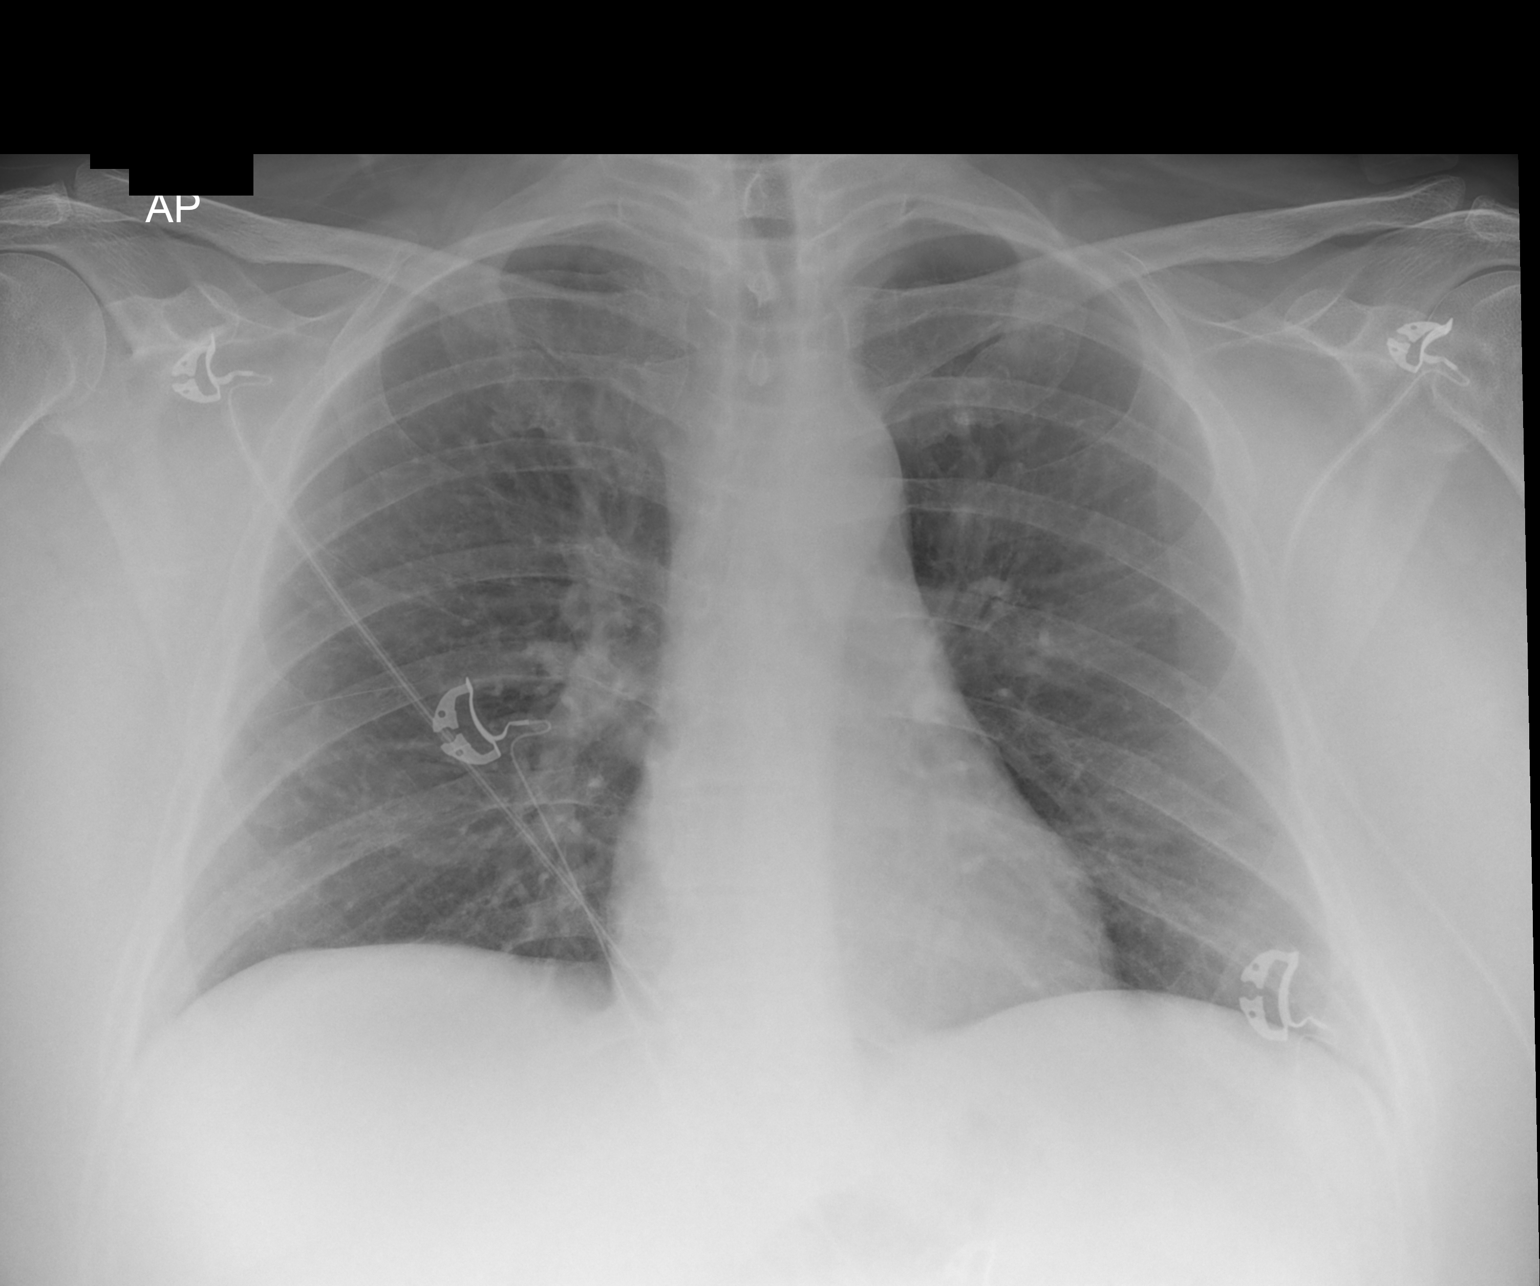

[1 of 1 positions shown; findings below may reference images not displayed]

FINDINGS: Numerous leads and wires project over the chest.  Midline
trachea.  Normal heart size and mediastinal contours. No pleural
effusion or pneumothorax.  Clear lungs.
IMPRESSION: Normal chest.

## 2014-12-22 IMAGING — US US RENAL TRANSPLANT
1 series · 14 of 19 positions shown · non-contrast
Comparison: None.

CLINICAL DATA: Renal failure

ULTRASOUND OF RENAL TRANSPLANT
TECHNIQUE: Ultrasound examination of the renal transplant was
performed with gray-scale and color Doppler evaluation.

[Series 1: us renal transplant · 0.28mm/px · 14 of 19 slices shown]
[im 1/19]
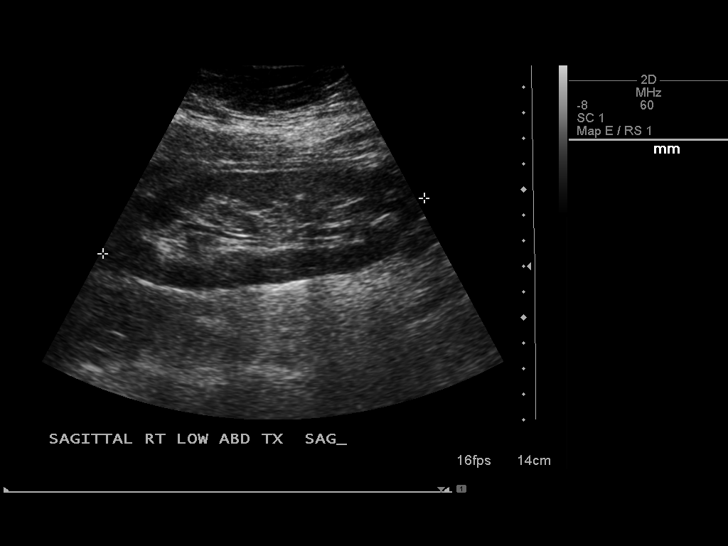
[im 3/19]
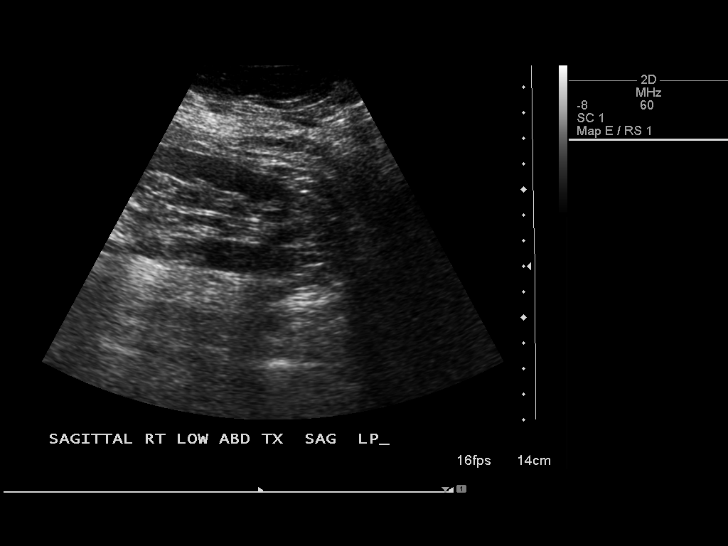
[im 4/19]
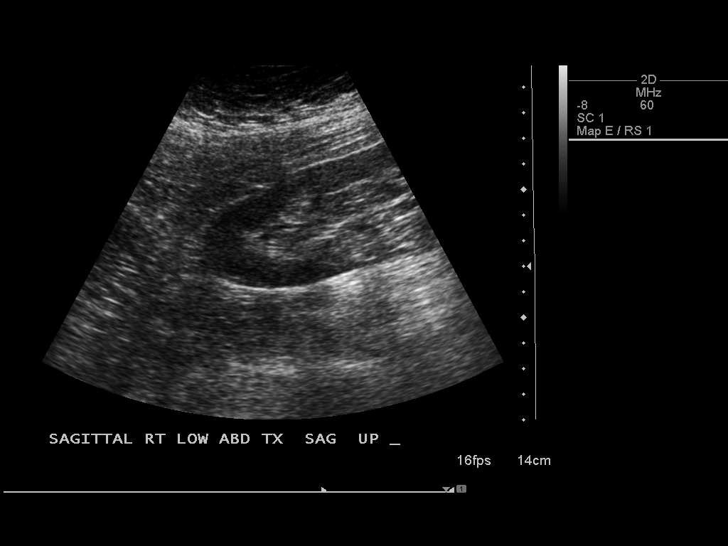
[im 5/19]
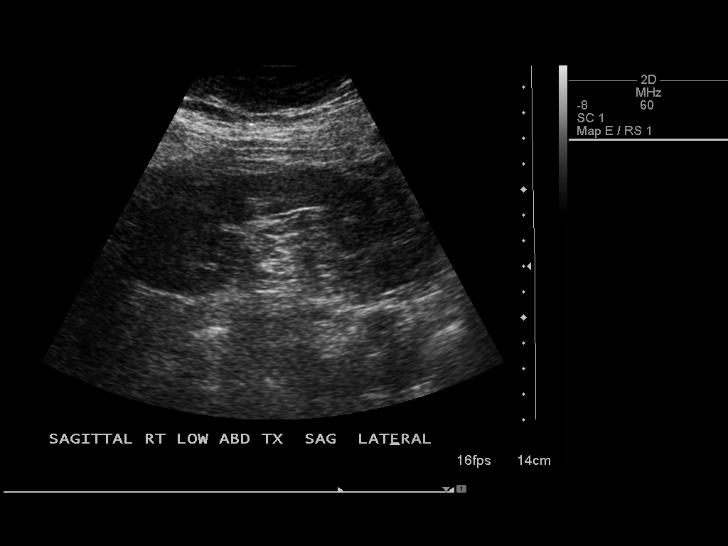
[im 7/19]
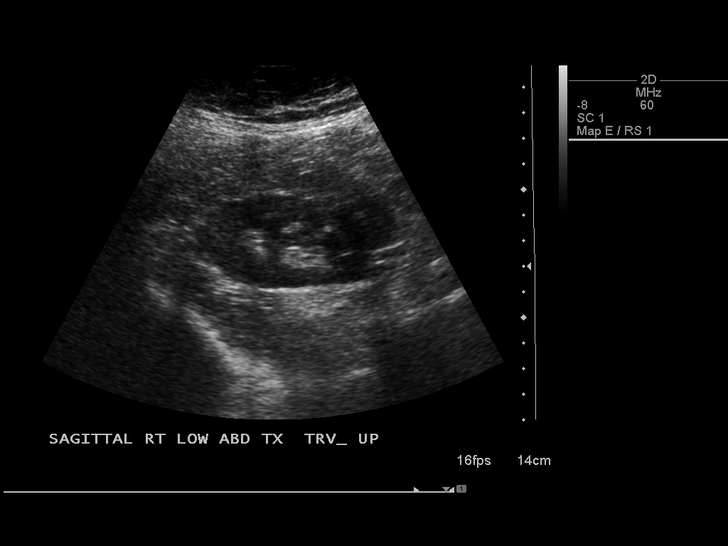
[im 8/19]
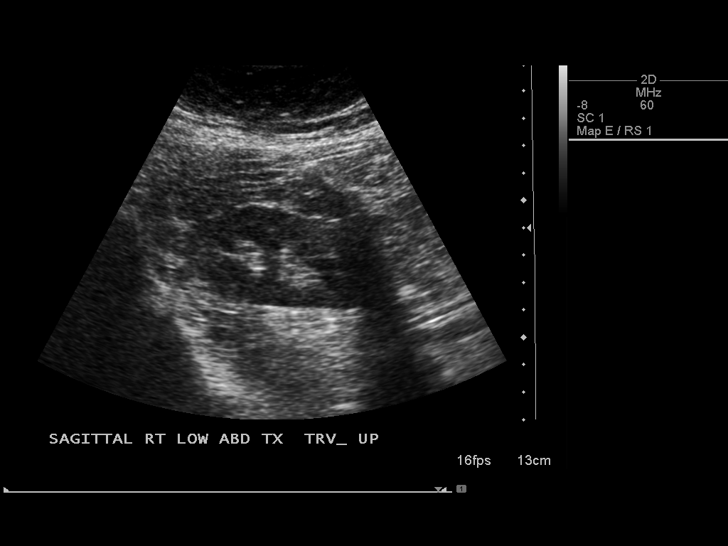
[im 9/19]
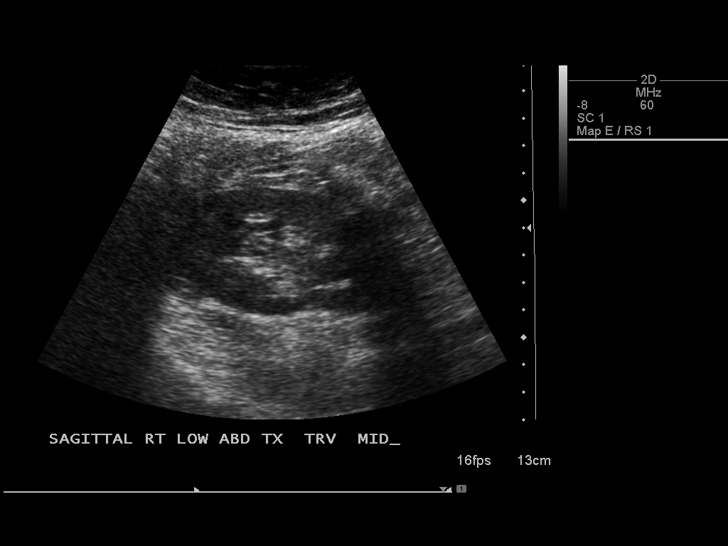
[im 11/19]
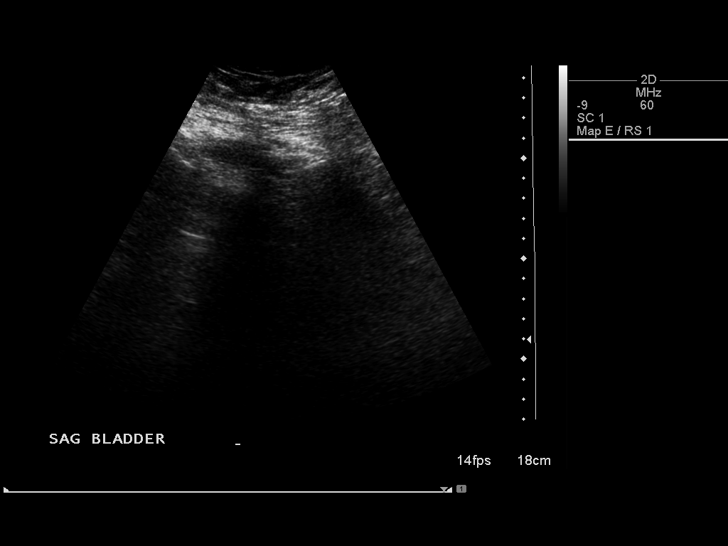
[im 12/19]
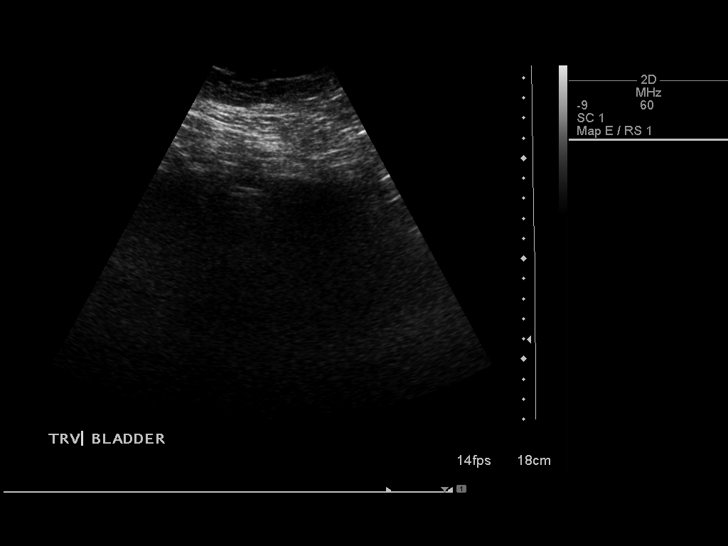
[im 13/19]
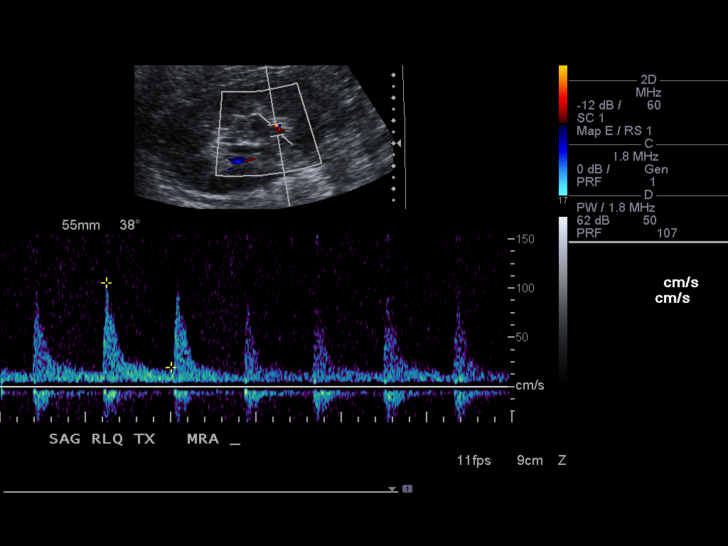
[im 15/19]
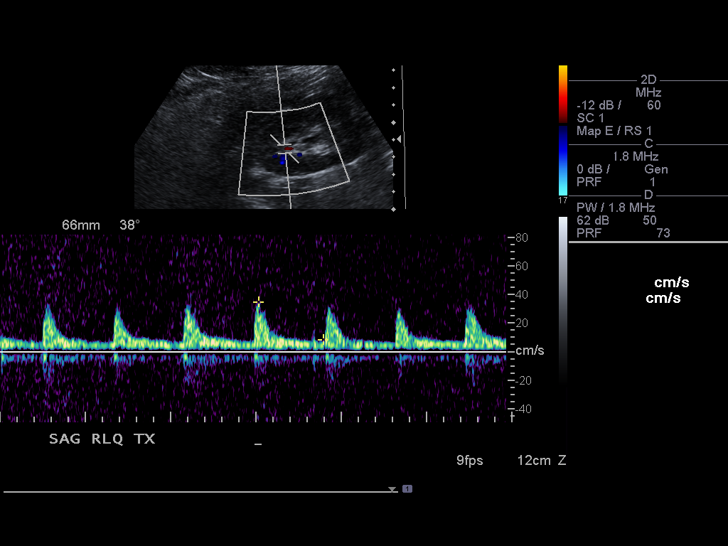
[im 16/19]
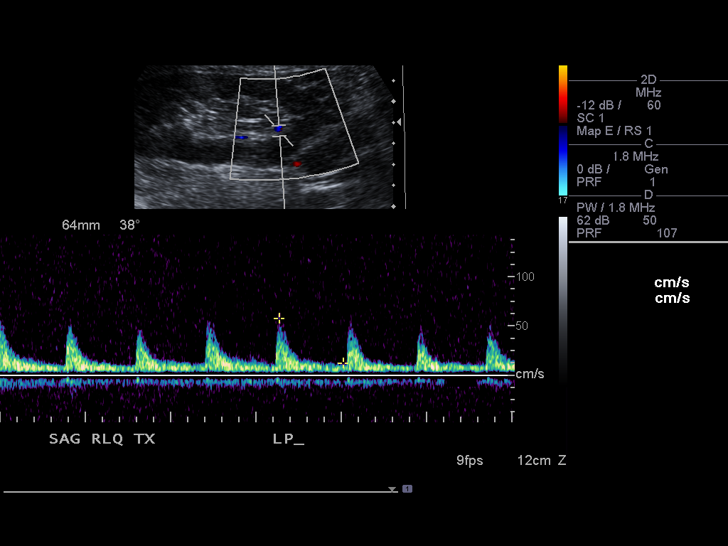
[im 17/19]
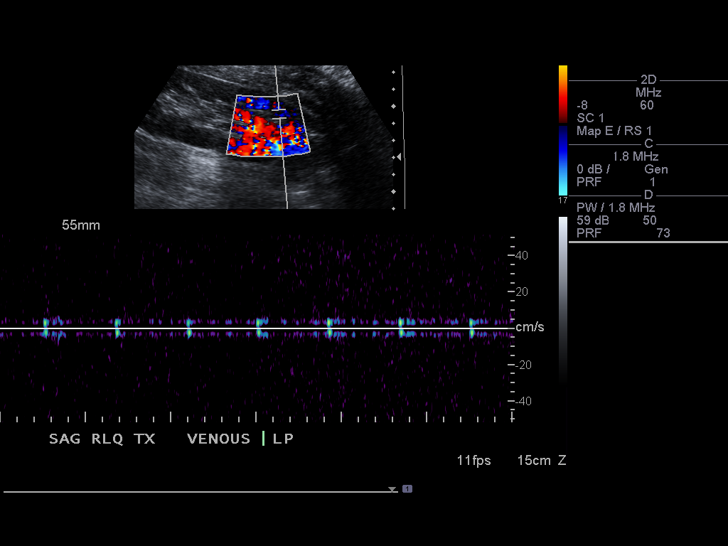
[im 19/19]
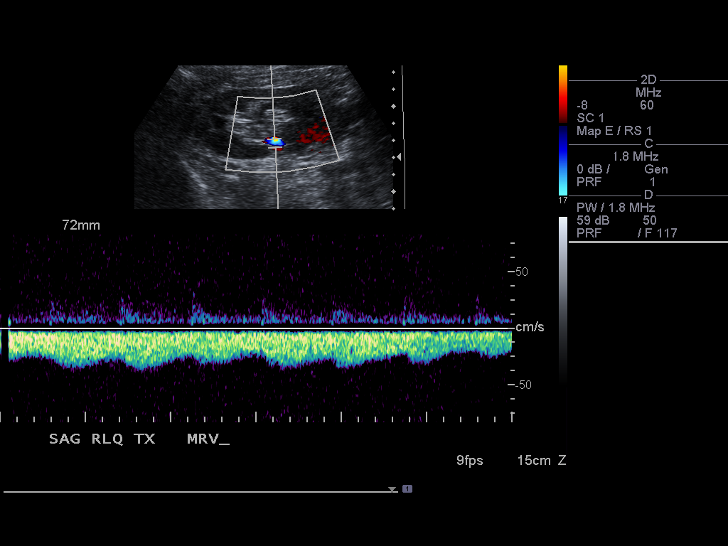

[14 of 19 positions shown; findings below may reference images not displayed]

FINDINGS: There is no perinephric fluid collection.  Doppler
analysis demonstrates a low resistance pattern for the interlobar
are renal artery branches.

Transplant kidney location:  Right lower quadrant

Transplant kidney description:  Normal in size and parenchymal
echogenicity. No evidence of mass or hydronephrosis.

Resistive indices are as follows:

Main renal artery:  0.81.

Right upper pole interlobar artery:  0.76.

Right lower pole interlobar artery:  0.79.

Color flow in the main renal artery at the hilum:  Present.

Color flow in the main renal vein at the hilum:  Present.

Bladder:  Decompressed.

Other findings:  None
IMPRESSION: Right lower quadrant renal transplant is within normal limits.  No
evidence of hydronephrosis.  Resistive indices are described above.

## 2015-01-02 ENCOUNTER — Observation Stay (HOSPITAL_COMMUNITY)
Admission: EM | Admit: 2015-01-02 | Discharge: 2015-01-03 | Disposition: A | Discharge status: Home / Self Care | Payer: Medicare Other | Attending: Internal Medicine | Admitting: Internal Medicine

## 2015-01-02 ENCOUNTER — Emergency Department (HOSPITAL_COMMUNITY): Payer: Medicare Other

## 2015-01-02 ENCOUNTER — Encounter (HOSPITAL_COMMUNITY): Payer: Self-pay | Admitting: *Deleted

## 2015-01-02 DIAGNOSIS — N186 End stage renal disease: Secondary | ICD-10-CM | POA: Diagnosis not present

## 2015-01-02 DIAGNOSIS — E860 Dehydration: Secondary | ICD-10-CM | POA: Insufficient documentation

## 2015-01-02 DIAGNOSIS — Z79899 Other long term (current) drug therapy: Secondary | ICD-10-CM | POA: Insufficient documentation

## 2015-01-02 DIAGNOSIS — Z7952 Long term (current) use of systemic steroids: Secondary | ICD-10-CM | POA: Diagnosis not present

## 2015-01-02 DIAGNOSIS — I120 Hypertensive chronic kidney disease with stage 5 chronic kidney disease or end stage renal disease: Secondary | ICD-10-CM | POA: Diagnosis not present

## 2015-01-02 DIAGNOSIS — Z89411 Acquired absence of right great toe: Secondary | ICD-10-CM | POA: Insufficient documentation

## 2015-01-02 DIAGNOSIS — R059 Cough, unspecified: Secondary | ICD-10-CM

## 2015-01-02 DIAGNOSIS — R739 Hyperglycemia, unspecified: Secondary | ICD-10-CM | POA: Diagnosis not present

## 2015-01-02 DIAGNOSIS — E039 Hypothyroidism, unspecified: Secondary | ICD-10-CM | POA: Insufficient documentation

## 2015-01-02 DIAGNOSIS — Z940 Kidney transplant status: Secondary | ICD-10-CM

## 2015-01-02 DIAGNOSIS — N179 Acute kidney failure, unspecified: Secondary | ICD-10-CM | POA: Diagnosis not present

## 2015-01-02 DIAGNOSIS — Z89421 Acquired absence of other right toe(s): Secondary | ICD-10-CM | POA: Diagnosis not present

## 2015-01-02 DIAGNOSIS — J069 Acute upper respiratory infection, unspecified: Secondary | ICD-10-CM | POA: Insufficient documentation

## 2015-01-02 DIAGNOSIS — Z87891 Personal history of nicotine dependence: Secondary | ICD-10-CM | POA: Insufficient documentation

## 2015-01-02 DIAGNOSIS — Z89412 Acquired absence of left great toe: Secondary | ICD-10-CM | POA: Insufficient documentation

## 2015-01-02 DIAGNOSIS — E1165 Type 2 diabetes mellitus with hyperglycemia: Secondary | ICD-10-CM | POA: Diagnosis not present

## 2015-01-02 DIAGNOSIS — Z794 Long term (current) use of insulin: Secondary | ICD-10-CM | POA: Insufficient documentation

## 2015-01-02 DIAGNOSIS — Z6832 Body mass index (BMI) 32.0-32.9, adult: Secondary | ICD-10-CM | POA: Insufficient documentation

## 2015-01-02 DIAGNOSIS — N189 Chronic kidney disease, unspecified: Secondary | ICD-10-CM

## 2015-01-02 DIAGNOSIS — R05 Cough: Secondary | ICD-10-CM

## 2015-01-02 DIAGNOSIS — R112 Nausea with vomiting, unspecified: Secondary | ICD-10-CM | POA: Diagnosis present

## 2015-01-02 LAB — CBC WITH DIFFERENTIAL/PLATELET
Basophils Absolute: 0 10*3/uL (ref 0.0–0.1)
Basophils Relative: 0 % (ref 0–1)
Eosinophils Absolute: 0.1 10*3/uL (ref 0.0–0.7)
Eosinophils Relative: 1 % (ref 0–5)
HEMATOCRIT: 43 % (ref 36.0–46.0)
HEMOGLOBIN: 14.1 g/dL (ref 12.0–15.0)
Lymphocytes Relative: 12 % (ref 12–46)
Lymphs Abs: 0.8 10*3/uL (ref 0.7–4.0)
MCH: 29.3 pg (ref 26.0–34.0)
MCHC: 32.8 g/dL (ref 30.0–36.0)
MCV: 89.2 fL (ref 78.0–100.0)
Monocytes Absolute: 0.4 10*3/uL (ref 0.1–1.0)
Monocytes Relative: 6 % (ref 3–12)
Neutro Abs: 5.1 10*3/uL (ref 1.7–7.7)
Neutrophils Relative %: 81 % — ABNORMAL HIGH (ref 43–77)
Platelets: 188 10*3/uL (ref 150–400)
RBC: 4.82 MIL/uL (ref 3.87–5.11)
RDW: 12.9 % (ref 11.5–15.5)
WBC: 6.3 10*3/uL (ref 4.0–10.5)

## 2015-01-02 LAB — MRSA PCR SCREENING: MRSA BY PCR: POSITIVE — AB

## 2015-01-02 LAB — URINALYSIS, ROUTINE W REFLEX MICROSCOPIC
Ketones, ur: 15 mg/dL — AB
LEUKOCYTES UA: NEGATIVE
Nitrite: NEGATIVE
PH: 5.5 (ref 5.0–8.0)
Protein, ur: 30 mg/dL — AB
Specific Gravity, Urine: 1.031 — ABNORMAL HIGH (ref 1.005–1.030)
Urobilinogen, UA: 0.2 mg/dL (ref 0.0–1.0)

## 2015-01-02 LAB — COMPREHENSIVE METABOLIC PANEL
ALBUMIN: 3.7 g/dL (ref 3.5–5.2)
ALT: 16 U/L (ref 0–35)
AST: 17 U/L (ref 0–37)
Alkaline Phosphatase: 99 U/L (ref 39–117)
Anion gap: 14 (ref 5–15)
BUN: 37 mg/dL — ABNORMAL HIGH (ref 6–23)
CHLORIDE: 98 mmol/L (ref 96–112)
CO2: 20 mmol/L (ref 19–32)
CREATININE: 1.49 mg/dL — AB (ref 0.50–1.10)
Calcium: 10.3 mg/dL (ref 8.4–10.5)
GFR, EST AFRICAN AMERICAN: 47 mL/min — AB (ref >90)
GFR, EST NON AFRICAN AMERICAN: 41 mL/min — AB (ref >90)
Glucose, Bld: 504 mg/dL — ABNORMAL HIGH (ref 70–99)
Potassium: 4.5 mmol/L (ref 3.5–5.1)
Sodium: 132 mmol/L — ABNORMAL LOW (ref 135–145)
TOTAL PROTEIN: 7.2 g/dL (ref 6.0–8.3)
Total Bilirubin: 1.2 mg/dL (ref 0.3–1.2)

## 2015-01-02 LAB — URINE MICROSCOPIC-ADD ON

## 2015-01-02 LAB — PROTIME-INR
INR: 1.06 (ref 0.00–1.49)
PROTHROMBIN TIME: 13.9 s (ref 11.6–15.2)

## 2015-01-02 LAB — GLUCOSE, CAPILLARY: GLUCOSE-CAPILLARY: 362 mg/dL — AB (ref 70–99)

## 2015-01-02 LAB — CBG MONITORING, ED: GLUCOSE-CAPILLARY: 500 mg/dL — AB (ref 70–99)

## 2015-01-02 LAB — I-STAT CG4 LACTIC ACID, ED: Lactic Acid, Venous: 1.77 mmol/L (ref 0.5–2.0)

## 2015-01-02 LAB — CLOSTRIDIUM DIFFICILE BY PCR: CDIFFPCR: NEGATIVE

## 2015-01-02 LAB — LIPASE, BLOOD: Lipase: 16 U/L (ref 11–59)

## 2015-01-02 LAB — TSH: TSH: 0.813 u[IU]/mL (ref 0.350–4.500)

## 2015-01-02 MED ORDER — SODIUM CHLORIDE 0.9 % IV SOLN: 1000.0000 mL | INTRAVENOUS | Status: DC

## 2015-01-02 MED ORDER — INSULIN ASPART 100 UNIT/ML ~~LOC~~ SOLN
15.0000 [IU] | Freq: Once | SUBCUTANEOUS | Status: AC
  Administered 2015-01-02: 15 [IU] via SUBCUTANEOUS
  Filled 2015-01-02: qty 1

## 2015-01-02 MED ORDER — ONDANSETRON HCL 4 MG PO TABS: 4.0000 mg | ORAL_TABLET | Freq: Four times a day (QID) | ORAL | Status: DC | PRN

## 2015-01-02 MED ORDER — ONDANSETRON HCL 4 MG/2ML IJ SOLN: 4.0000 mg | Freq: Four times a day (QID) | INTRAMUSCULAR | Status: DC | PRN

## 2015-01-02 MED ORDER — TACROLIMUS 1 MG PO CAPS
1.0000 mg | ORAL_CAPSULE | Freq: Two times a day (BID) | ORAL | Status: DC
  Administered 2015-01-02 – 2015-01-03 (×2): 1 mg via ORAL
  Filled 2015-01-02 (×3): qty 1

## 2015-01-02 MED ORDER — CHLORHEXIDINE GLUCONATE CLOTH 2 % EX PADS
6.0000 | MEDICATED_PAD | Freq: Every day | CUTANEOUS | Status: DC
  Administered 2015-01-03: 6 via TOPICAL

## 2015-01-02 MED ORDER — INSULIN ASPART PROT & ASPART (70-30 MIX) 100 UNIT/ML ~~LOC~~ SUSP
30.0000 [IU] | Freq: Two times a day (BID) | SUBCUTANEOUS | Status: DC
  Administered 2015-01-02 – 2015-01-03 (×2): 30 [IU] via SUBCUTANEOUS
  Filled 2015-01-02: qty 10

## 2015-01-02 MED ORDER — LEVOTHYROXINE SODIUM 137 MCG PO TABS
137.0000 ug | ORAL_TABLET | Freq: Every day | ORAL | Status: DC
  Administered 2015-01-03: 137 ug via ORAL
  Filled 2015-01-02 (×2): qty 1

## 2015-01-02 MED ORDER — ACETAMINOPHEN 325 MG PO TABS: 650.0000 mg | ORAL_TABLET | Freq: Four times a day (QID) | ORAL | Status: DC | PRN

## 2015-01-02 MED ORDER — PREDNISONE 5 MG PO TABS
5.0000 mg | ORAL_TABLET | Freq: Every day | ORAL | Status: DC
  Administered 2015-01-03: 5 mg via ORAL
  Filled 2015-01-02: qty 1

## 2015-01-02 MED ORDER — ACETAMINOPHEN 500 MG PO TABS: 500.0000 mg | ORAL_TABLET | Freq: Four times a day (QID) | ORAL | Status: DC | PRN

## 2015-01-02 MED ORDER — MYCOPHENOLATE MOFETIL 250 MG PO CAPS
500.0000 mg | ORAL_CAPSULE | Freq: Two times a day (BID) | ORAL | Status: DC
  Administered 2015-01-02 – 2015-01-03 (×2): 500 mg via ORAL
  Filled 2015-01-02 (×3): qty 2

## 2015-01-02 MED ORDER — INSULIN ASPART 100 UNIT/ML ~~LOC~~ SOLN
0.0000 [IU] | Freq: Three times a day (TID) | SUBCUTANEOUS | Status: DC
  Administered 2015-01-02: 15 [IU] via SUBCUTANEOUS
  Administered 2015-01-03: 3 [IU] via SUBCUTANEOUS

## 2015-01-02 MED ORDER — ADULT MULTIVITAMIN W/MINERALS CH
1.0000 | ORAL_TABLET | Freq: Every day | ORAL | Status: DC
  Administered 2015-01-03: 1 via ORAL
  Filled 2015-01-02: qty 1

## 2015-01-02 MED ORDER — MUPIROCIN 2 % EX OINT
1.0000 "application " | TOPICAL_OINTMENT | Freq: Two times a day (BID) | CUTANEOUS | Status: DC
  Administered 2015-01-03: 1 via NASAL
  Filled 2015-01-02: qty 22

## 2015-01-02 MED ORDER — SODIUM CHLORIDE 0.9 % IJ SOLN: 3.0000 mL | Freq: Two times a day (BID) | INTRAMUSCULAR | Status: DC

## 2015-01-02 MED ORDER — SODIUM CHLORIDE 0.9 % IV SOLN
1000.0000 mL | Freq: Once | INTRAVENOUS | Status: AC
Start: 2015-01-02 — End: 2015-01-02
  Administered 2015-01-02: 1000 mL via INTRAVENOUS

## 2015-01-02 MED ORDER — MORPHINE SULFATE 2 MG/ML IJ SOLN
1.0000 mg | INTRAMUSCULAR | Status: DC | PRN
  Administered 2015-01-02 – 2015-01-03 (×2): 1 mg via INTRAVENOUS
  Filled 2015-01-02 (×3): qty 1

## 2015-01-02 MED ORDER — SODIUM CHLORIDE 0.9 % IV SOLN
INTRAVENOUS | Status: DC
  Filled 2015-01-02: qty 2.5

## 2015-01-02 MED ORDER — SODIUM CHLORIDE 0.9 % IV SOLN: INTRAVENOUS | Status: DC

## 2015-01-02 MED ORDER — ISOSORB DINITRATE-HYDRALAZINE 20-37.5 MG PO TABS
1.0000 | ORAL_TABLET | Freq: Two times a day (BID) | ORAL | Status: DC
  Administered 2015-01-02 – 2015-01-03 (×2): 1 via ORAL
  Filled 2015-01-02 (×3): qty 1

## 2015-01-02 MED ORDER — AMLODIPINE BESYLATE 5 MG PO TABS
5.0000 mg | ORAL_TABLET | Freq: Every day | ORAL | Status: DC
  Administered 2015-01-02 – 2015-01-03 (×2): 5 mg via ORAL
  Filled 2015-01-02 (×2): qty 1

## 2015-01-02 MED ORDER — DEXTROSE-NACL 5-0.45 % IV SOLN: INTRAVENOUS | Status: DC

## 2015-01-02 MED ORDER — CARVEDILOL 25 MG PO TABS
50.0000 mg | ORAL_TABLET | Freq: Two times a day (BID) | ORAL | Status: DC
  Administered 2015-01-02 – 2015-01-03 (×2): 50 mg via ORAL
  Filled 2015-01-02 (×3): qty 2

## 2015-01-02 MED ORDER — HEPARIN SODIUM (PORCINE) 5000 UNIT/ML IJ SOLN
5000.0000 [IU] | Freq: Three times a day (TID) | INTRAMUSCULAR | Status: DC
  Administered 2015-01-02 – 2015-01-03 (×4): 5000 [IU] via SUBCUTANEOUS
  Filled 2015-01-02 (×6): qty 1

## 2015-01-02 MED ORDER — ACETAMINOPHEN 650 MG RE SUPP: 650.0000 mg | Freq: Four times a day (QID) | RECTAL | Status: DC | PRN

## 2015-01-02 MED ORDER — HYDROCODONE-ACETAMINOPHEN 5-325 MG PO TABS
1.0000 | ORAL_TABLET | ORAL | Status: DC | PRN
  Administered 2015-01-02 – 2015-01-03 (×2): 2 via ORAL
  Filled 2015-01-02 (×2): qty 2

## 2015-01-02 MED ORDER — SODIUM CHLORIDE 0.9 % IV BOLUS (SEPSIS)
1000.0000 mL | Freq: Once | INTRAVENOUS | Status: AC
  Administered 2015-01-02: 1000 mL via INTRAVENOUS

## 2015-01-02 MED ORDER — SODIUM CHLORIDE 0.9 % IV SOLN
INTRAVENOUS | Status: DC
  Administered 2015-01-02: 16:00:00 via INTRAVENOUS

## 2015-01-02 NOTE — ED Provider Notes (Signed)
CSN: [id number]     Arrival date & time 01/02/15  1155 History   First MD Initiated Contact with Patient 01/02/15 1155     Chief Complaint  Patient presents with  . Hyperglycemia  . Abdominal Pain  . Nausea  . Emesis  . Diarrhea     (Consider location/radiation/quality/duration/timing/severity/associated sxs/prior Treatment) HPI Comments: The patient is a 49 year old female, she is a known insulin requiring diabetic who has also had a kidney transplant and is currently immunosuppressed. She has had several days of coughing, has now developed intermittent urinary symptoms with incomplete urinary emptying, dysuria, and now has diarrhea. This is gradually getting worse over the week and her blood sugars have been trending upwards, over 400 when paramedics arrived at the patient's residence. She denies fevers chills back pain chest pain lower extremity swelling or blood in her stools. She has been taking insulin 7030, 30 units twice daily, she does not use a sliding scale.  The history is provided by the patient and medical records.    Past Medical History  Diagnosis Date  . Hypertension   . Hypothyroid   . Depression   . 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 Kidney disease   . End stage renal disease     right arm AV graft, post transplant  . Diabetes mellitus     Insulin dependant   Past Surgical History  Procedure Laterality Date  . Back surgery  2001  . Tubal ligation    . Dg av dialysis graft declot or    . Nephrectomy transplanted organ    . Kidney transplant  December 16, 2009  . 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  . Cataract extraction w/ intraocular lens implant Right   . Amputation Left 05/11/2014    Procedure: AMPUTATION LEFT GREAT TOE;  Surgeon: Kathryne Hitch, MD;  Location: [facility];  Service: Orthopedics;  Laterality: Left;   Family History  Problem Relation Age of Onset  . Emphysema Mother   . Throat cancer Mother   . COPD Mother   .  Cancer Mother   . Emphysema Father   . COPD Father   . Stroke Father   . ADD / ADHD Son    History  Substance Use Topics  . Smoking status: Former Smoker -- 1.00 packs/day for 11 years    Types: Cigarettes    Quit date: 09/21/1998  . Smokeless tobacco: Never Used  . Alcohol Use: Yes     Comment: occasional   OB History    No data available     Review of Systems  All other systems reviewed and are negative.     Allergies  Ace inhibitors and Adhesive  Home Medications   Prior to Admission medications   Medication Sig Start Date End Date Taking? Authorizing Provider  acetaminophen (TYLENOL) 500 MG tablet Take 500-1,000 mg by mouth every 6 (six) hours as needed for headache.    Historical Provider, MD  amLODipine (NORVASC) 5 MG tablet Take 1 tablet (5 mg total) by mouth daily. 08/08/12   Alison Murray, MD  carbamide peroxide (DEBROX) 6.5 % otic solution Place 5 drops into the left ear 2 (two) times daily. 10/29/14   Vivianne Master, PA-C  carvedilol (COREG) 25 MG tablet Take 2 tablets (50 mg total) by mouth 2 (two) times daily. 08/08/12   Alison Murray, MD  insulin NPH-regular Human (NOVOLIN 70/30) (70-30) 100 UNIT/ML injection Inject 30 Units into  the skin 2 (two) times daily with a meal. 10/25/14   Jerald Kief, MD  isosorbide-hydrALAZINE (BIDIL) 20-37.5 MG per tablet Take 1 tablet by mouth 2 (two) times daily. 05/12/14   Catarina Hartshorn, MD  levothyroxine (SYNTHROID, LEVOTHROID) 137 MCG tablet Take 137 mcg by mouth daily. Name brand    Historical Provider, MD  Multiple Vitamin (MULTIVITAMIN WITH MINERALS) TABS tablet Take 1 tablet by mouth daily.    Historical Provider, MD  mycophenolate (CELLCEPT) 250 MG capsule Take 500 mg by mouth 2 (two) times daily.     Historical Provider, MD  predniSONE (DELTASONE) 5 MG tablet Take 5 mg by mouth daily.      Historical Provider, MD  ranitidine (ZANTAC) 150 MG capsule Take 150 mg by mouth daily.     Historical Provider, MD  tacrolimus (PROGRAF) 1  MG capsule Take 1 mg by mouth 2 (two) times daily.     Historical Provider, MD   BP 115/54 mmHg  Pulse 76  Temp(Src) 98 F (36.7 C) (Oral)  Resp 16  SpO2 97%  LMP 11/19/2011 Physical Exam  Constitutional: She appears well-developed and well-nourished. No distress.  HENT:  Head: Normocephalic and atraumatic.  Mouth/Throat: Oropharynx is clear and moist. No oropharyngeal exudate.  Eyes: Conjunctivae and EOM are normal. Pupils are equal, round, and reactive to light. Right eye exhibits no discharge. Left eye exhibits no discharge. No scleral icterus.  Neck: Normal range of motion. Neck supple. No JVD present. No thyromegaly present.  Cardiovascular: Normal rate, regular rhythm and intact distal pulses.  Exam reveals no gallop and no friction rub.   Murmur (late systolic murmur) heard. Pulmonary/Chest: Effort normal and breath sounds normal. No respiratory distress. She has no wheezes. She has no rales.  Abdominal: Soft. Bowel sounds are normal. She exhibits no distension and no mass. There is tenderness (minimal suprapubic tenderness).  Musculoskeletal: Normal range of motion. She exhibits no edema or tenderness.  Lymphadenopathy:    She has no cervical adenopathy.  Neurological: She is alert. Coordination normal.  Skin: Skin is warm and dry. No rash noted. No erythema.  Psychiatric: She has a normal mood and affect. Her behavior is normal.  Nursing note and vitals reviewed.   ED Course  Procedures (including critical care time) Labs Review Labs Reviewed  COMPREHENSIVE METABOLIC PANEL - Abnormal; Notable for the following:    Sodium 132 (*)    Glucose, Bld 504 (*)    BUN 37 (*)    Creatinine, Ser 1.49 (*)    GFR calc non Af Amer 41 (*)    GFR calc Af Amer 47 (*)    All other components within normal limits  CBC WITH DIFFERENTIAL/PLATELET - Abnormal; Notable for the following:    Neutrophils Relative % 81 (*)    All other components within normal limits  URINALYSIS, ROUTINE W  REFLEX MICROSCOPIC - Abnormal; Notable for the following:    Specific Gravity, Urine 1.031 (*)    Glucose, UA >1000 (*)    Hgb urine dipstick TRACE (*)    Bilirubin Urine LARGE (*)    Ketones, ur 15 (*)    Protein, ur 30 (*)    All other components within normal limits  URINE MICROSCOPIC-ADD ON - Abnormal; Notable for the following:    Bacteria, UA FEW (*)    All other components within normal limits  CBG MONITORING, ED - Abnormal; Notable for the following:    Glucose-Capillary 500 (*)    All other components  within normal limits  URINE CULTURE  LIPASE, BLOOD  I-STAT CG4 LACTIC ACID, ED    Imaging Review Dg Chest 2 View  01/02/2015   CLINICAL DATA:  Cough for 1 week.  EXAM: CHEST  2 VIEW  COMPARISON:  Single view of the chest 10/23/2016. PA and lateral chest 01/05/2013.  FINDINGS: The lungs are clear. Heart size is normal. No pneumothorax or pleural effusion.  IMPRESSION: Negative chest.   Electronically Signed   By: Drusilla Kannerhomas  Dalessio M.D.   On: 01/02/2015 13:30    MDM   Final diagnoses:  Cough  Hyperglycemia  Acute kidney injury    The patient does have some dry mucous membranes, her vital signs are overall unremarkable, blood sugar is 500 on arrival, she has a very taken her insulin this morning, this suggested there are some underlying pathology, recurring, check labs, rule out DKA, check renal function, hydrate, insulin when necessary.  D/w Dr. Arthor CaptainElmahi - will admit - labs show no DKA - hyperglycemic and renal insufficiency compared with baseline.  Fluids, insluing drip.  Meds given in ED:  Medications  0.9 %  sodium chloride infusion (not administered)    Followed by  0.9 %  sodium chloride infusion (not administered)  0.9 %  sodium chloride infusion (not administered)  insulin aspart (novoLOG) injection 15 Units (not administered)  sodium chloride 0.9 % bolus 1,000 mL (1,000 mLs Intravenous New Bag/Given 01/02/15 1231)      Eber HongBrian Berlyn Malina, MD 01/02/15 1339

## 2015-01-02 NOTE — Progress Notes (Signed)
Inpatient Diabetes Program Recommendations  AACE/ADA: New Consensus Statement on Inpatient Glycemic Control (2013)  Target Ranges:  Prepandial:   less than 140 mg/dL      Peak postprandial:   less than 180 mg/dL (1-2 hours)      Critically ill patients:  140 - 180 mg/dL   Reason for Visit: Hyperglycemia  Diabetes history: DM1 Outpatient Diabetes medications: 70/30 30 units bid Current orders for Inpatient glycemic control: 70/30 30 units bid, Novolog moderate tidwc  49 year old female, she is a known insulin requiring diabetic who has also had a kidney transplant and is currently immunosuppressed. She has had several days of coughing, has now developed intermittent urinary symptoms with incomplete urinary emptying, dysuria, and now has diarrhea. This is gradually getting worse over the week and her blood sugars have been trending upwards, over 400 when paramedics arrived at the patient's residence. She denies fevers chills back pain chest pain lower extremity swelling or blood in her stools. She has been taking insulin 7030, 30 units twice daily, she does not use a sliding scale.    Results for TAMMEY, DEEG (MRN [id number]) as of 01/02/2015 16:00  Ref. Range 01/02/2015 12:16  Sodium Latest Ref Range: 135-145 mmol/L 132 (L)  Potassium Latest Ref Range: 3.5-5.1 mmol/L 4.5  Chloride Latest Ref Range: 96-112 mmol/L 98  CO2 Latest Ref Range: 19-32 mmol/L 20  BUN Latest Ref Range: 6-23 mg/dL 37 (H)  Creatinine Latest Ref Range: 0.50-1.10 mg/dL 1.49 (H)  Calcium Latest Ref Range: 8.4-10.5 mg/dL 10.3  EGFR (Non-African Amer.) Latest Ref Range: >90 mL/min 41 (L)  EGFR (African American) Latest Ref Range: >90 mL/min 47 (L)  Glucose Latest Ref Range: 70-99 mg/dL 504 (H)  Anion gap Latest Ref Range: 5-15  14  Results for MARJAN, ROSMAN (MRN [id number]) as of 01/02/2015 16:00  Ref. Range 01/02/2015 11:58  Glucose-Capillary Latest Ref Range: 70-99 mg/dL 500 (H)   Uncontrolled blood  sugars.  Recommendations: Add HS correction. Titrate 70/30 if CBGs > 180 mg/dL. HgbA1C pending.  Thank you. Lorenda Peck, RD, LDN, CDE Inpatient Diabetes Coordinator [phone]

## 2015-01-02 NOTE — ED Notes (Signed)
Bed: WA05 Expected date:  Expected time:  Means of arrival:  Comments: hyperglycemia 

## 2015-01-02 NOTE — H&P (Signed)
[facility] History and Physical  [name] ZOX:[id number] DOB: 16-Dec-1965 DOA: 01/02/2015  Referring physician: EDP PCP: [name], MD   Chief Complaint: Nausea and vomiting  HPI: [name] is a 49 y.o. female with past medical history of renal transplant, hypertension and morbid obesity. Patient came in to the hospital complaining about multiple issues. Patient says for the past several days she's been having allergy symptoms including congestion, runny nose and postnasal drip which comes with cough as well. Patient also been having nausea, vomiting with some diarrhea, overall her abdominal symptoms are improving, yesterday she had 3 loose bowel movements, but overall she felt dehydrated and her blood sugar was uncontrolled so she came into the ED for further evaluation. In the ED blood sugar was 504, creatinine 1.4 with baseline of 1.0 in February 2016. Patient clinically appears dehydrated so decision was made to admit her overnight for IV fluid hydration.  Review of Systems:  Constitutional: negative for anorexia, fevers and sweats Eyes: negative for irritation, redness and visual disturbance Ears, nose, mouth, throat, and face: Nasal congestion and runny nose Respiratory: negative for cough, dyspnea on exertion, sputum and wheezing Cardiovascular: negative for chest pain, dyspnea, lower extremity edema, orthopnea, palpitations and syncope Gastrointestinal: Nausea, vomiting and diarrhea. Genitourinary:negative for dysuria, frequency and hematuria Hematologic/lymphatic: negative for bleeding, easy bruising and lymphadenopathy Musculoskeletal:negative for arthralgias, muscle weakness and stiff joints Neurological: negative for coordination problems, gait problems, headaches and weakness Endocrine: negative for diabetic symptoms including polydipsia, polyuria and weight loss Allergic/Immunologic: negative for anaphylaxis, hay fever and urticaria  Past  Medical History  Diagnosis Date  . Hypertension   . Hypothyroid   . Depression   . 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 Kidney disease   . End stage renal disease     right arm AV graft, post transplant  . Diabetes mellitus     Insulin dependant   Past Surgical History  Procedure Laterality Date  . Back surgery  2001  . Tubal ligation    . Dg av dialysis graft declot or    . Nephrectomy transplanted organ    . Kidney transplant  December 16, 2009  . 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  . Cataract extraction w/ intraocular lens implant Right   . Amputation Left 05/11/2014    Procedure: AMPUTATION LEFT GREAT TOE;  Surgeon: Kathryne Hitch, MD;  Location: [facility];  Service: Orthopedics;  Laterality: Left;   Social History:   reports that she quit smoking about 16 years ago. Her smoking use included Cigarettes. She has a 11 pack-year smoking history. She has never used smokeless tobacco. She reports that she drinks alcohol. She reports that she does not use illicit drugs.  Allergies  Allergen Reactions  . Ace Inhibitors Other (See Comments)    Cough   ( Lisinopril )  . Adhesive [Tape] Itching    Family History  Problem Relation Age of Onset  . Emphysema Mother   . Throat cancer Mother   . COPD Mother   . Cancer Mother   . Emphysema Father   . COPD Father   . Stroke Father   . ADD / ADHD Son      Prior to Admission medications   Medication Sig Start Date End Date Taking? Authorizing Provider  acetaminophen (TYLENOL) 500 MG tablet Take 500-1,000 mg by mouth every 6 (six) hours as needed for headache.   Yes Historical Provider, MD  amLODipine (NORVASC)  5 MG tablet Take 1 tablet (5 mg total) by mouth daily. 08/08/12  Yes Alison Murray, MD  carbamide peroxide [facility]) 6.5 % otic solution Place 5 drops into the left ear 2 (two) times daily. 10/29/14  Yes Tiffany Netta Cedars, PA-C  carvedilol (COREG) 25 MG tablet Take 2 tablets (50 mg total) by mouth 2 (two)  times daily. 08/08/12  Yes Alison Murray, MD  insulin NPH-regular Human (NOVOLIN 70/30) (70-30) 100 UNIT/ML injection Inject 30 Units into the skin 2 (two) times daily with a meal. 10/25/14  Yes Jerald Kief, MD  isosorbide-hydrALAZINE (BIDIL) 20-37.5 MG per tablet Take 1 tablet by mouth 2 (two) times daily. 05/12/14  Yes Catarina Hartshorn, MD  levothyroxine (SYNTHROID, LEVOTHROID) 137 MCG tablet Take 137 mcg by mouth daily. Name brand   Yes Historical Provider, MD  Multiple Vitamin (MULTIVITAMIN WITH MINERALS) TABS tablet Take 1 tablet by mouth daily.   Yes Historical Provider, MD  mycophenolate (CELLCEPT) 250 MG capsule Take 500 mg by mouth 2 (two) times daily.    Yes Historical Provider, MD  predniSONE (DELTASONE) 5 MG tablet Take 5 mg by mouth daily.     Yes Historical Provider, MD  ranitidine (ZANTAC) 150 MG capsule Take 150 mg by mouth daily.    Yes Historical Provider, MD  tacrolimus (PROGRAF) 1 MG capsule Take 1 mg by mouth 2 (two) times daily.    Yes Historical Provider, MD   Physical Exam: Filed Vitals:   01/02/15 1156  BP: 115/54  Pulse: 76  Temp: 98 F (36.7 C)  Resp: 16   Constitutional: Oriented to person, place, and time. Well-developed and well-nourished. Cooperative.  Head: Normocephalic and atraumatic.  Nose: Nose normal.  Mouth/Throat: Uvula is midline, oropharynx is clear and moist and mucous membranes are normal.  Eyes: Conjunctivae and EOM are normal. Pupils are equal, round, and reactive to light.  Neck: Trachea normal and normal range of motion. Neck supple.  Cardiovascular: Normal rate, regular rhythm, S1 normal, S2 normal, normal heart sounds and intact distal pulses.   Pulmonary/Chest: Effort normal and breath sounds normal.  Abdominal: Soft. Bowel sounds are normal. There is no hepatosplenomegaly. There is no tenderness.  Musculoskeletal: Normal range of motion.  Neurological: Alert and oriented to person, place, and time. Has normal strength. No cranial nerve deficit  or sensory deficit.  Skin: Skin is warm, dry and intact.  Psychiatric: Has a normal mood and affect. Speech is normal and behavior is normal.   Labs on Admission:  Basic Metabolic Panel:  Recent Labs Lab 01/02/15 1216  NA 132*  K 4.5  CL 98  CO2 20  GLUCOSE 504*  BUN 37*  CREATININE 1.49*  CALCIUM 10.3   Liver Function Tests:  Recent Labs Lab 01/02/15 1216  AST 17  ALT 16  ALKPHOS 99  BILITOT 1.2  PROT 7.2  ALBUMIN 3.7    Recent Labs Lab 01/02/15 1216  LIPASE 16   No results for input(s): AMMONIA in the last 168 hours. CBC:  Recent Labs Lab 01/02/15 1216  WBC 6.3  NEUTROABS 5.1  HGB 14.1  HCT 43.0  MCV 89.2  PLT 188   Cardiac Enzymes: No results for input(s): CKTOTAL, CKMB, CKMBINDEX, TROPONINI in the last 168 hours.  BNP (last 3 results) No results for input(s): BNP in the last 8760 hours.  ProBNP (last 3 results) No results for input(s): PROBNP in the last 8760 hours.  CBG:  Recent Labs Lab 01/02/15 1158  GLUCAP 500*  Radiological Exams on Admission: Dg Chest 2 View  01/02/2015   CLINICAL DATA:  Cough for 1 week.  EXAM: CHEST  2 VIEW  COMPARISON:  Single view of the chest 10/23/2016. PA and lateral chest 01/05/2013.  FINDINGS: The lungs are clear. Heart size is normal. No pneumothorax or pleural effusion.  IMPRESSION: Negative chest.   Electronically Signed   By: Drusilla Kannerhomas  Dalessio M.D.   On: 01/02/2015 13:30    Assessment/Plan Principal Problem:   Hyperglycemia Active Problems:   H/O kidney transplant   Acute on chronic renal failure   Dehydration   AKI (acute kidney injury)    Hyperglycemia Presented with glucose of 544, no evidence of acidosis or ketosis, bicarbonate of 20. Patient reported that she took her 70/30 insulin this morning, reported brittle diabetes. Given 15 units of NovoLog, will let her eat. Sliding scale.  Acute kidney injury Presented with creatinine of 1.49, baseline creatinine was 1.0 in February  2016. This is likely secondary to dehydration and hypovolemia from GI losses as well as osmotic diuresis. Hydrate with IV fluids, check BMP in a.m.  Nausea, vomiting and diarrhea Unsure of this is transient gastroenteritis, overall improving. Hydrate with IV fluids and treat symptomatically with antiemetics.  History of renal transplant Patient is on prednisone, Myfortic and Prograf, continue current medications.   Code Status: Full code Family Communication: Plan discussed with the patient Disposition Plan: MedSurg bed  Time spent: 70 minutes  Jerimie Mancuso A, MD [facility] Pager [phone]

## 2015-01-02 NOTE — ED Notes (Signed)
Per EMS, pt complains of hyperglycemia, abd pain, headache, n/v/d for the past week. Pt states her CBG readings have been around ~350 for the past week. EMS reports CBG was 422 upon arrival.

## 2015-01-02 NOTE — Progress Notes (Signed)
Patient has reported 3 loose stools prior to admission.  Per protocol patient placed on contact, and stool will be sent for c diff.

## 2015-01-03 DIAGNOSIS — J069 Acute upper respiratory infection, unspecified: Secondary | ICD-10-CM | POA: Insufficient documentation

## 2015-01-03 DIAGNOSIS — N179 Acute kidney failure, unspecified: Secondary | ICD-10-CM | POA: Insufficient documentation

## 2015-01-03 DIAGNOSIS — Z940 Kidney transplant status: Secondary | ICD-10-CM | POA: Diagnosis not present

## 2015-01-03 DIAGNOSIS — R112 Nausea with vomiting, unspecified: Secondary | ICD-10-CM | POA: Diagnosis not present

## 2015-01-03 DIAGNOSIS — R739 Hyperglycemia, unspecified: Secondary | ICD-10-CM | POA: Diagnosis not present

## 2015-01-03 LAB — BASIC METABOLIC PANEL
Anion gap: 5 (ref 5–15)
BUN: 33 mg/dL — ABNORMAL HIGH (ref 6–23)
CHLORIDE: 110 mmol/L (ref 96–112)
CO2: 22 mmol/L (ref 19–32)
CREATININE: 1.17 mg/dL — AB (ref 0.50–1.10)
Calcium: 9.4 mg/dL (ref 8.4–10.5)
GFR, EST AFRICAN AMERICAN: 63 mL/min — AB (ref >90)
GFR, EST NON AFRICAN AMERICAN: 55 mL/min — AB (ref >90)
Glucose, Bld: 188 mg/dL — ABNORMAL HIGH (ref 70–99)
POTASSIUM: 4 mmol/L (ref 3.5–5.1)
Sodium: 137 mmol/L (ref 135–145)

## 2015-01-03 LAB — CBC
HCT: 38.3 % (ref 36.0–46.0)
HEMOGLOBIN: 12.1 g/dL (ref 12.0–15.0)
MCH: 28.4 pg (ref 26.0–34.0)
MCHC: 31.6 g/dL (ref 30.0–36.0)
MCV: 89.9 fL (ref 78.0–100.0)
Platelets: 150 10*3/uL (ref 150–400)
RBC: 4.26 MIL/uL (ref 3.87–5.11)
RDW: 13.3 % (ref 11.5–15.5)
WBC: 4.3 10*3/uL (ref 4.0–10.5)

## 2015-01-03 LAB — GLUCOSE, CAPILLARY
Glucose-Capillary: 218 mg/dL — ABNORMAL HIGH (ref 70–99)
Glucose-Capillary: 161 mg/dL — ABNORMAL HIGH (ref 70–99)
Glucose-Capillary: 116 mg/dL — ABNORMAL HIGH (ref 70–99)

## 2015-01-03 MED ORDER — LORATADINE-PSEUDOEPHEDRINE ER 5-120 MG PO TB12
1.0000 | ORAL_TABLET | Freq: Two times a day (BID) | ORAL | Status: DC
Start: 2015-01-03 — End: 2015-03-04

## 2015-01-03 MED ORDER — LORATADINE 10 MG PO TABS
10.0000 mg | ORAL_TABLET | Freq: Every day | ORAL | Status: DC
  Administered 2015-01-03: 10 mg via ORAL
  Filled 2015-01-03: qty 1

## 2015-01-03 MED ORDER — PSEUDOEPHEDRINE HCL ER 120 MG PO TB12
120.0000 mg | ORAL_TABLET | Freq: Two times a day (BID) | ORAL | Status: DC
  Administered 2015-01-03: 120 mg via ORAL
  Filled 2015-01-03 (×2): qty 1

## 2015-01-03 NOTE — Discharge Summary (Signed)
Physician Discharge Summary  [name] ZOX:[id number] DOB: October 22, 1965 DOA: 01/02/2015  PCP: [name], MD  Admit date: 01/02/2015 Discharge date: 01/03/2015  Time spent: 65 minutes  Recommendations for Outpatient Follow-up:  1. Follow-up with PCP as scheduled. On follow-up patient need a basic metabolic profile done to follow-up on electrolytes and renal function.  Discharge Diagnoses:  Principal Problem:   Hyperglycemia Active Problems:   H/O kidney transplant   Acute on chronic renal failure   Dehydration   AKI (acute kidney injury)   Discharge Condition: stable and improved  Diet recommendation: carb modified.  Filed Weights   01/02/15 1537  Weight: 102.9 kg (226 lb 13.7 oz)    History of present illness:  [name] is a 49 y.o. female with past medical history of renal transplant, hypertension and morbid obesity. Patient came in to the hospital complaining about multiple issues. Patient says for the past several days she's been having allergy symptoms including congestion, runny nose and postnasal drip which comes with cough as well. Patient also been having nausea, vomiting with some diarrhea, overall her abdominal symptoms are improving, yesterday she had 3 loose bowel movements, but overall she felt dehydrated and her blood sugar was uncontrolled so she came into the ED for further evaluation. In the ED blood sugar was 504, creatinine 1.4 with baseline of 1.0 in February 2016. Patient clinically appears dehydrated so decision was made to admit her overnight for IV fluid hydration.  Hospital Course:  #1hyperglycemia/diabetes mellitus Patient presented with a blood glucose of 544. There was no evidence of acidosis or ketosis. Bicarbonate levels were within normal limits at 20. 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 Patient stated she was compliant with her medication. Patient was placed on a home regimen placed on sliding scale insulin and followed.  Patient's hyperglycemia resolved by day of discharge. Outpatient follow-up.  #2 acute kidney injury On presentation patient had a creatinine of 1.49 with a baseline noted of 1 in February 2016. Was felt this was secondary to prerenal azotemia secondary to dehydration and hypovolemia from GI losses. Patient was hydrated with IV fluids with improvement in her renal function. On day of discharge patient's creatinine was 1.17. A   #3 nausea/vomiting/diarrhea Likely secondary to transient gastroenteritis. Improved. Patient with no further nausea emesis or diarrhea. Patient was tolerating oral intake. Patient was hydrated with IV fluids. Patient was treated symptomatically. Outpatient follow-up.  #4 history of renal transplant Stable patient was maintained on a home regimen of prednisone, Prograf, CellCept..  #5 hypothyroidism Patient was continued on home dose Synthroid.  #6 viral upper respiratory infection Stable. Patient was placed on Claritin and Sudafed. Outpatient follow-up.   Procedures:  Chest x-ray 01/02/2015  Consultations:  None  Discharge Exam: Filed Vitals:   01/03/15 1404  BP: 132/61  Pulse: 74  Temp: 98.4 F (36.9 C)  Resp: 18    General: NAD Cardiovascular: RRR Respiratory: CTAB  Discharge Instructions   Discharge Instructions    Diet Carb Modified    Complete by:  As directed      Discharge instructions    Complete by:  As directed   Follow up with PCP as scheduled.     Increase activity slowly    Complete by:  As directed           Current Discharge Medication List    START taking these medications   Details  loratadine-pseudoephedrine (CLARITIN-D 12 HOUR) 5-120 MG per tablet Take 1 tablet by  mouth 2 (two) times daily. Take for 1 week, then stop. Qty: 7 tablet, Refills: 0      CONTINUE these medications which have NOT CHANGED   Details  acetaminophen (TYLENOL) 500 MG tablet Take 500-1,000 mg by mouth every 6 (six) hours as needed for  headache.    amLODipine (NORVASC) 5 MG tablet Take 1 tablet (5 mg total) by mouth daily. Qty: 30 tablet, Refills: 0    carbamide peroxide (DEBROX) 6.5 % otic solution Place 5 drops into the left ear 2 (two) times daily. Qty: 15 mL, Refills: 0    carvedilol (COREG) 25 MG tablet Take 2 tablets (50 mg total) by mouth 2 (two) times daily. Qty: 60 tablet, Refills: 0    insulin NPH-regular Human (NOVOLIN 70/30) (70-30) 100 UNIT/ML injection Inject 30 Units into the skin 2 (two) times daily with a meal. Qty: 10 mL, Refills: 0    isosorbide-hydrALAZINE (BIDIL) 20-37.5 MG per tablet Take 1 tablet by mouth 2 (two) times daily. Qty: 60 tablet, Refills: 0    levothyroxine (SYNTHROID, LEVOTHROID) 137 MCG tablet Take 137 mcg by mouth daily. Name brand    Multiple Vitamin (MULTIVITAMIN WITH MINERALS) TABS tablet Take 1 tablet by mouth daily.    mycophenolate (CELLCEPT) 250 MG capsule Take 500 mg by mouth 2 (two) times daily.     predniSONE (DELTASONE) 5 MG tablet Take 5 mg by mouth daily.      ranitidine (ZANTAC) 150 MG capsule Take 150 mg by mouth daily.     tacrolimus (PROGRAF) 1 MG capsule Take 1 mg by mouth 2 (two) times daily.        Allergies  Allergen Reactions  . Ace Inhibitors Other (See Comments)    Cough   ( Lisinopril )  . Adhesive [Tape] Itching   Follow-up Information    Follow up with [facility] On 01/18/2015.   Specialty:  Family Medicine   Why:  appointment at 10:45, come early to fill out paper work   Contact information:   [address] [place] [address] [phone]        The results of significant diagnostics from this hospitalization (including imaging, microbiology, ancillary and laboratory) are listed below for reference.    Significant Diagnostic Studies: Dg Chest 2 View  01/02/2015   CLINICAL DATA:  Cough for 1 week.  EXAM: CHEST  2 VIEW  COMPARISON:  Single view of the chest 10/23/2016. PA and lateral chest  01/05/2013.  FINDINGS: The lungs are clear. Heart size is normal. No pneumothorax or pleural effusion.  IMPRESSION: Negative chest.   Electronically Signed   By: Drusilla Kanner M.D.   On: 01/02/2015 13:30    Microbiology: Recent Results (from the past 240 hour(s))  Clostridium Difficile by PCR     Status: None   Collection Time: 01/02/15  5:00 PM  Result Value Ref Range Status   C difficile by pcr NEGATIVE NEGATIVE Final  MRSA PCR Screening     Status: Abnormal   Collection Time: 01/02/15  6:00 PM  Result Value Ref Range Status   MRSA by PCR POSITIVE (A) NEGATIVE Final    Comment:        The GeneXpert MRSA Assay (FDA approved for NASAL specimens only), is one component of a comprehensive MRSA colonization surveillance program. It is not intended to diagnose MRSA infection nor to guide or monitor treatment for MRSA infections. RESULT CALLED TO, READ BACK BY AND VERIFIED WITH:  MERRITT,A/5E @2256  ON 01/02/15 BY KARCZEWSKI,S.      Labs: Basic Metabolic Panel:  Recent Labs Lab 01/02/15 1216 01/03/15 0530  NA 132* 137  K 4.5 4.0  CL 98 110  CO2 20 22  GLUCOSE 504* 188*  BUN 37* 33*  CREATININE 1.49* 1.17*  CALCIUM 10.3 9.4   Liver Function Tests:  Recent Labs Lab 01/02/15 1216  AST 17  ALT 16  ALKPHOS 99  BILITOT 1.2  PROT 7.2  ALBUMIN 3.7    Recent Labs Lab 01/02/15 1216  LIPASE 16   No results for input(s): AMMONIA in the last 168 hours. CBC:  Recent Labs Lab 01/02/15 1216 01/03/15 0530  WBC 6.3 4.3  NEUTROABS 5.1  --   HGB 14.1 12.1  HCT 43.0 38.3  MCV 89.2 89.9  PLT 188 150   Cardiac Enzymes: No results for input(s): CKTOTAL, CKMB, CKMBINDEX, TROPONINI in the last 168 hours. BNP: BNP (last 3 results) No results for input(s): BNP in the last 8760 hours.  ProBNP (last 3 results) No results for input(s): PROBNP in the last 8760 hours.  CBG:  Recent Labs Lab 01/02/15 1158 01/02/15 1605 01/02/15 2116 01/03/15 0738 01/03/15 1132   GLUCAP 500* 362* 218* 161* 116*       Signed:  THOMPSON,DANIEL MD [facility] 01/03/2015, 4:31 PM

## 2015-01-03 NOTE — Progress Notes (Signed)
UR completed 

## 2015-01-03 NOTE — Progress Notes (Signed)
Patient given discharge instructions, and verbalized an understanding of all discharge instructions.  Patient agrees with discharge plan, and is being discharged in stable medical condition.  Patient ambulatory and was escorted to the front door.

## 2015-01-04 LAB — URINE CULTURE: Colony Count: >=100000

## 2015-01-04 LAB — HEMOGLOBIN A1C
HEMOGLOBIN A1C: 11.6 % — AB (ref 4.8–5.6)
MEAN PLASMA GLUCOSE: 286 mg/dL

## 2015-03-02 ENCOUNTER — Inpatient Hospital Stay (HOSPITAL_COMMUNITY)
Admission: EM | Admit: 2015-03-02 | Discharge: 2015-03-04 | DRG: 637 | Disposition: A | Discharge status: Home / Self Care | Payer: Medicare Other | Attending: Internal Medicine | Admitting: Internal Medicine

## 2015-03-02 ENCOUNTER — Encounter (HOSPITAL_COMMUNITY): Payer: Self-pay | Admitting: Emergency Medicine

## 2015-03-02 DIAGNOSIS — Z87891 Personal history of nicotine dependence: Secondary | ICD-10-CM

## 2015-03-02 DIAGNOSIS — E875 Hyperkalemia: Secondary | ICD-10-CM | POA: Diagnosis present

## 2015-03-02 DIAGNOSIS — Z91120 Patient's intentional underdosing of medication regimen due to financial hardship: Secondary | ICD-10-CM | POA: Diagnosis present

## 2015-03-02 DIAGNOSIS — E1010 Type 1 diabetes mellitus with ketoacidosis without coma: Secondary | ICD-10-CM | POA: Diagnosis present

## 2015-03-02 DIAGNOSIS — Z7952 Long term (current) use of systemic steroids: Secondary | ICD-10-CM | POA: Diagnosis not present

## 2015-03-02 DIAGNOSIS — Z9109 Other allergy status, other than to drugs and biological substances: Secondary | ICD-10-CM | POA: Diagnosis not present

## 2015-03-02 DIAGNOSIS — Z823 Family history of stroke: Secondary | ICD-10-CM

## 2015-03-02 DIAGNOSIS — N186 End stage renal disease: Secondary | ICD-10-CM | POA: Diagnosis present

## 2015-03-02 DIAGNOSIS — Z794 Long term (current) use of insulin: Secondary | ICD-10-CM | POA: Diagnosis not present

## 2015-03-02 DIAGNOSIS — D849 Immunodeficiency, unspecified: Secondary | ICD-10-CM | POA: Diagnosis present

## 2015-03-02 DIAGNOSIS — E1110 Type 2 diabetes mellitus with ketoacidosis without coma: Secondary | ICD-10-CM | POA: Diagnosis present

## 2015-03-02 DIAGNOSIS — I959 Hypotension, unspecified: Secondary | ICD-10-CM | POA: Diagnosis present

## 2015-03-02 DIAGNOSIS — I120 Hypertensive chronic kidney disease with stage 5 chronic kidney disease or end stage renal disease: Secondary | ICD-10-CM | POA: Diagnosis present

## 2015-03-02 DIAGNOSIS — D899 Disorder involving the immune mechanism, unspecified: Secondary | ICD-10-CM | POA: Diagnosis present

## 2015-03-02 DIAGNOSIS — E039 Hypothyroidism, unspecified: Secondary | ICD-10-CM | POA: Diagnosis present

## 2015-03-02 DIAGNOSIS — E1022 Type 1 diabetes mellitus with diabetic chronic kidney disease: Secondary | ICD-10-CM | POA: Diagnosis present

## 2015-03-02 DIAGNOSIS — Z825 Family history of asthma and other chronic lower respiratory diseases: Secondary | ICD-10-CM | POA: Diagnosis not present

## 2015-03-02 DIAGNOSIS — Z808 Family history of malignant neoplasm of other organs or systems: Secondary | ICD-10-CM | POA: Diagnosis not present

## 2015-03-02 DIAGNOSIS — Z79899 Other long term (current) drug therapy: Secondary | ICD-10-CM | POA: Diagnosis not present

## 2015-03-02 DIAGNOSIS — R112 Nausea with vomiting, unspecified: Secondary | ICD-10-CM | POA: Diagnosis not present

## 2015-03-02 DIAGNOSIS — Z888 Allergy status to other drugs, medicaments and biological substances status: Secondary | ICD-10-CM

## 2015-03-02 DIAGNOSIS — Z940 Kidney transplant status: Secondary | ICD-10-CM

## 2015-03-02 DIAGNOSIS — F329 Major depressive disorder, single episode, unspecified: Secondary | ICD-10-CM | POA: Diagnosis present

## 2015-03-02 DIAGNOSIS — E872 Acidosis: Secondary | ICD-10-CM

## 2015-03-02 DIAGNOSIS — E8729 Other acidosis: Secondary | ICD-10-CM | POA: Diagnosis present

## 2015-03-02 DIAGNOSIS — T383X6A Underdosing of insulin and oral hypoglycemic [antidiabetic] drugs, initial encounter: Secondary | ICD-10-CM | POA: Diagnosis present

## 2015-03-02 LAB — COMPREHENSIVE METABOLIC PANEL
ALBUMIN: 3.4 g/dL — AB (ref 3.5–5.0)
ALT: 15 U/L (ref 14–54)
ANION GAP: 25 — AB (ref 5–15)
AST: 13 U/L — ABNORMAL LOW (ref 15–41)
Alkaline Phosphatase: 118 U/L (ref 38–126)
BILIRUBIN TOTAL: 2.1 mg/dL — AB (ref 0.3–1.2)
BUN: 39 mg/dL — AB (ref 6–20)
CO2: 8 mmol/L — AB (ref 22–32)
CREATININE: 1.8 mg/dL — AB (ref 0.44–1.00)
Calcium: 10 mg/dL (ref 8.9–10.3)
Chloride: 97 mmol/L — ABNORMAL LOW (ref 101–111)
GFR calc non Af Amer: 32 mL/min — ABNORMAL LOW (ref >60)
GFR, EST AFRICAN AMERICAN: 37 mL/min — AB (ref >60)
GLUCOSE: 696 mg/dL — AB (ref 65–99)
POTASSIUM: 5.2 mmol/L — AB (ref 3.5–5.1)
Sodium: 130 mmol/L — ABNORMAL LOW (ref 135–145)
TOTAL PROTEIN: 6.6 g/dL (ref 6.5–8.1)

## 2015-03-02 LAB — URINALYSIS, ROUTINE W REFLEX MICROSCOPIC
BILIRUBIN URINE: NEGATIVE
Glucose, UA: >1000 mg/dL — AB
Hgb urine dipstick: NEGATIVE
Ketones, ur: >80 mg/dL — AB
Leukocytes, UA: NEGATIVE
Nitrite: NEGATIVE
PROTEIN: NEGATIVE mg/dL
SPECIFIC GRAVITY, URINE: 1.025 (ref 1.005–1.030)
Urobilinogen, UA: 0.2 mg/dL (ref 0.0–1.0)
pH: 5 (ref 5.0–8.0)

## 2015-03-02 LAB — BASIC METABOLIC PANEL
ANION GAP: 12 (ref 5–15)
Anion gap: 22 — ABNORMAL HIGH (ref 5–15)
BUN: 39 mg/dL — ABNORMAL HIGH (ref 6–20)
BUN: 39 mg/dL — ABNORMAL HIGH (ref 6–20)
CALCIUM: 9.7 mg/dL (ref 8.9–10.3)
CHLORIDE: 107 mmol/L (ref 101–111)
CO2: 9 mmol/L — ABNORMAL LOW (ref 22–32)
CO2: 18 mmol/L — ABNORMAL LOW (ref 22–32)
CREATININE: 1.92 mg/dL — AB (ref 0.44–1.00)
Calcium: 9.8 mg/dL (ref 8.9–10.3)
Chloride: 104 mmol/L (ref 101–111)
Creatinine, Ser: 1.65 mg/dL — ABNORMAL HIGH (ref 0.44–1.00)
GFR calc Af Amer: 35 mL/min — ABNORMAL LOW (ref >60)
GFR calc non Af Amer: 30 mL/min — ABNORMAL LOW (ref >60)
GFR calc non Af Amer: 36 mL/min — ABNORMAL LOW (ref >60)
GFR, EST AFRICAN AMERICAN: 41 mL/min — AB (ref >60)
Glucose, Bld: 431 mg/dL — ABNORMAL HIGH (ref 65–99)
Glucose, Bld: 209 mg/dL — ABNORMAL HIGH (ref 65–99)
Potassium: 4.1 mmol/L (ref 3.5–5.1)
Potassium: 4.1 mmol/L (ref 3.5–5.1)
Sodium: 135 mmol/L (ref 135–145)
Sodium: 137 mmol/L (ref 135–145)

## 2015-03-02 LAB — CBC WITH DIFFERENTIAL/PLATELET
Basophils Absolute: 0 10*3/uL (ref 0.0–0.1)
Basophils Relative: 0 % (ref 0–1)
Eosinophils Absolute: 0 10*3/uL (ref 0.0–0.7)
Eosinophils Relative: 0 % (ref 0–5)
HCT: 42.2 % (ref 36.0–46.0)
Hemoglobin: 13.7 g/dL (ref 12.0–15.0)
Lymphocytes Relative: 17 % (ref 12–46)
Lymphs Abs: 1.1 10*3/uL (ref 0.7–4.0)
MCH: 29.8 pg (ref 26.0–34.0)
MCHC: 32.5 g/dL (ref 30.0–36.0)
MCV: 91.9 fL (ref 78.0–100.0)
MONO ABS: 0.5 10*3/uL (ref 0.1–1.0)
Monocytes Relative: 8 % (ref 3–12)
NEUTROS PCT: 75 % (ref 43–77)
Neutro Abs: 4.9 10*3/uL (ref 1.7–7.7)
Platelets: 170 10*3/uL (ref 150–400)
RBC: 4.59 MIL/uL (ref 3.87–5.11)
RDW: 12.8 % (ref 11.5–15.5)
WBC: 6.5 10*3/uL (ref 4.0–10.5)

## 2015-03-02 LAB — GLUCOSE, CAPILLARY
Glucose-Capillary: 577 mg/dL (ref 65–99)
Glucose-Capillary: 495 mg/dL — ABNORMAL HIGH (ref 65–99)
Glucose-Capillary: 362 mg/dL — ABNORMAL HIGH (ref 65–99)
Glucose-Capillary: 226 mg/dL — ABNORMAL HIGH (ref 65–99)

## 2015-03-02 LAB — BLOOD GAS, VENOUS
Acid-base deficit: 19.7 mmol/L — ABNORMAL HIGH (ref 0.0–2.0)
Bicarbonate: 7.8 mEq/L — ABNORMAL LOW (ref 20.0–24.0)
Drawn by: 421661
O2 SAT: 76.3 %
PH VEN: 7.167 — AB (ref 7.250–7.300)
Patient temperature: 98.6
TCO2: 7.4 mmol/L (ref 0–100)
pCO2, Ven: 22.5 mmHg — ABNORMAL LOW (ref 45.0–50.0)
pO2, Ven: 47.3 mmHg — ABNORMAL HIGH (ref 30.0–45.0)

## 2015-03-02 LAB — I-STAT CG4 LACTIC ACID, ED: Lactic Acid, Venous: 1.84 mmol/L (ref 0.5–2.0)

## 2015-03-02 LAB — CBG MONITORING, ED
GLUCOSE-CAPILLARY: 533 mg/dL — AB (ref 65–99)
Glucose-Capillary: >600 mg/dL (ref 65–99)
Glucose-Capillary: 530 mg/dL — ABNORMAL HIGH (ref 65–99)

## 2015-03-02 LAB — URINE MICROSCOPIC-ADD ON

## 2015-03-02 LAB — MRSA PCR SCREENING: MRSA by PCR: POSITIVE — AB

## 2015-03-02 MED ORDER — ACETAMINOPHEN 500 MG PO TABS
500.0000 mg | ORAL_TABLET | Freq: Four times a day (QID) | ORAL | Status: DC | PRN
  Administered 2015-03-04: 1000 mg via ORAL
  Filled 2015-03-02: qty 2

## 2015-03-02 MED ORDER — SODIUM CHLORIDE 0.9 % IV SOLN: INTRAVENOUS | Status: DC

## 2015-03-02 MED ORDER — MYCOPHENOLATE MOFETIL 250 MG PO CAPS
500.0000 mg | ORAL_CAPSULE | Freq: Two times a day (BID) | ORAL | Status: DC
  Administered 2015-03-02 – 2015-03-04 (×4): 500 mg via ORAL
  Filled 2015-03-02 (×5): qty 2

## 2015-03-02 MED ORDER — LEVOTHYROXINE SODIUM 25 MCG PO TABS
137.0000 ug | ORAL_TABLET | Freq: Every day | ORAL | Status: DC
  Administered 2015-03-03 – 2015-03-04 (×2): 137 ug via ORAL
  Filled 2015-03-02 (×4): qty 1

## 2015-03-02 MED ORDER — CHLORHEXIDINE GLUCONATE CLOTH 2 % EX PADS
6.0000 | MEDICATED_PAD | Freq: Every day | CUTANEOUS | Status: DC
  Administered 2015-03-03 – 2015-03-04 (×2): 6 via TOPICAL

## 2015-03-02 MED ORDER — SODIUM CHLORIDE 0.9 % IV SOLN
1000.0000 mL | INTRAVENOUS | Status: DC
  Administered 2015-03-02: 1000 mL via INTRAVENOUS

## 2015-03-02 MED ORDER — CHLORHEXIDINE GLUCONATE CLOTH 2 % EX PADS: 6.0000 | MEDICATED_PAD | Freq: Every day | CUTANEOUS | Status: DC

## 2015-03-02 MED ORDER — SODIUM CHLORIDE 0.9 % IJ SOLN
3.0000 mL | Freq: Two times a day (BID) | INTRAMUSCULAR | Status: DC
  Administered 2015-03-02 – 2015-03-04 (×3): 3 mL via INTRAVENOUS

## 2015-03-02 MED ORDER — SODIUM CHLORIDE 0.9 % IV SOLN
INTRAVENOUS | Status: DC
Start: 2015-03-02 — End: 2015-03-02
  Filled 2015-03-02: qty 2.5

## 2015-03-02 MED ORDER — PREDNISONE 5 MG PO TABS
5.0000 mg | ORAL_TABLET | Freq: Every day | ORAL | Status: DC
  Administered 2015-03-03 – 2015-03-04 (×2): 5 mg via ORAL
  Filled 2015-03-02 (×2): qty 1

## 2015-03-02 MED ORDER — DEXTROSE-NACL 5-0.45 % IV SOLN: INTRAVENOUS | Status: DC

## 2015-03-02 MED ORDER — MUPIROCIN 2 % EX OINT
1.0000 "application " | TOPICAL_OINTMENT | Freq: Two times a day (BID) | CUTANEOUS | Status: DC
  Administered 2015-03-02 – 2015-03-04 (×4): 1 via NASAL
  Filled 2015-03-02: qty 22

## 2015-03-02 MED ORDER — ACETAMINOPHEN 500 MG PO TABS
1000.0000 mg | ORAL_TABLET | Freq: Once | ORAL | Status: AC
  Administered 2015-03-02: 1000 mg via ORAL
  Filled 2015-03-02: qty 2

## 2015-03-02 MED ORDER — KETOROLAC TROMETHAMINE 30 MG/ML IJ SOLN: 15.0000 mg | Freq: Once | INTRAMUSCULAR | Status: DC

## 2015-03-02 MED ORDER — ONDANSETRON HCL 4 MG PO TABS: 4.0000 mg | ORAL_TABLET | Freq: Four times a day (QID) | ORAL | Status: DC | PRN

## 2015-03-02 MED ORDER — SODIUM CHLORIDE 0.9 % IV SOLN
INTRAVENOUS | Status: DC
  Administered 2015-03-02: 4.7 [IU]/h via INTRAVENOUS
  Filled 2015-03-02: qty 2.5

## 2015-03-02 MED ORDER — ONDANSETRON HCL 4 MG/2ML IJ SOLN
4.0000 mg | Freq: Four times a day (QID) | INTRAMUSCULAR | Status: DC | PRN
  Administered 2015-03-03 (×2): 4 mg via INTRAVENOUS
  Filled 2015-03-02 (×2): qty 2

## 2015-03-02 MED ORDER — FAMOTIDINE 20 MG PO TABS
20.0000 mg | ORAL_TABLET | Freq: Every day | ORAL | Status: DC
  Administered 2015-03-03 – 2015-03-04 (×2): 20 mg via ORAL
  Filled 2015-03-02 (×2): qty 1

## 2015-03-02 MED ORDER — OXYMETAZOLINE HCL 0.05 % NA SOLN
1.0000 | Freq: Two times a day (BID) | NASAL | Status: DC | PRN
  Filled 2015-03-02: qty 15

## 2015-03-02 MED ORDER — DEXTROSE-NACL 5-0.45 % IV SOLN
INTRAVENOUS | Status: DC
  Administered 2015-03-02: 20:00:00 via INTRAVENOUS

## 2015-03-02 MED ORDER — TACROLIMUS 1 MG PO CAPS
1.0000 mg | ORAL_CAPSULE | Freq: Two times a day (BID) | ORAL | Status: DC
  Administered 2015-03-02 – 2015-03-04 (×4): 1 mg via ORAL
  Filled 2015-03-02 (×5): qty 1

## 2015-03-02 MED ORDER — OXYCODONE-ACETAMINOPHEN 5-325 MG PO TABS
1.0000 | ORAL_TABLET | Freq: Four times a day (QID) | ORAL | Status: DC | PRN
  Administered 2015-03-02 – 2015-03-03 (×2): 1 via ORAL
  Filled 2015-03-02 (×2): qty 1

## 2015-03-02 MED ORDER — SODIUM CHLORIDE 0.9 % IV BOLUS (SEPSIS)
1000.0000 mL | Freq: Once | INTRAVENOUS | Status: AC
  Administered 2015-03-02: 1000 mL via INTRAVENOUS

## 2015-03-02 NOTE — ED Provider Notes (Signed)
CSN: [id number]     Arrival date & time 03/02/15  1102 History   First MD Initiated Contact with Patient 03/02/15 1114     Chief Complaint  Patient presents with  . Hyperglycemia     (Consider location/radiation/quality/duration/timing/severity/associated sxs/prior Treatment) HPI She presents with concern of generalized discomfort, headache, weakness, lightheadedness, nausea, vomiting. Symptoms began yesterday, has been progressive in all regards. Patient notes that she has recently been cutting her insulin dosing in half to preserve her supply. Since onset no relief with OTC medication, or anything. No clear alleviating or exacerbating factors and general.  Past Medical History  Diagnosis Date  . Hypertension   . Hypothyroid   . Depression   . 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 Kidney disease   . End stage renal disease     right arm AV graft, post transplant  . Diabetes mellitus     Insulin dependant   Past Surgical History  Procedure Laterality Date  . Back surgery  2001  . Tubal ligation    . Dg av dialysis graft declot or    . Nephrectomy transplanted organ    . Kidney transplant  December 16, 2009  . 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  . Cataract extraction w/ intraocular lens implant Right   . Amputation Left 05/11/2014    Procedure: AMPUTATION LEFT GREAT TOE;  Surgeon: Kathryne Hitch, MD;  Location: [facility];  Service: Orthopedics;  Laterality: Left;   Family History  Problem Relation Age of Onset  . Emphysema Mother   . Throat cancer Mother   . COPD Mother   . Cancer Mother   . Emphysema Father   . COPD Father   . Stroke Father   . ADD / ADHD Son    History  Substance Use Topics  . Smoking status: Former Smoker -- 1.00 packs/day for 11 years    Types: Cigarettes    Quit date: 09/21/1998  . Smokeless tobacco: Never Used  . Alcohol Use: Yes     Comment: occasional   OB History    No data available     Review of Systems   Constitutional:       Per HPI, otherwise negative  HENT:       Per HPI, otherwise negative  Respiratory:       Per HPI, otherwise negative  Cardiovascular:       Per HPI, otherwise negative  Gastrointestinal: Positive for nausea and vomiting.  Endocrine:       Negative aside from HPI  Genitourinary:       Neg aside from HPI   Musculoskeletal:       Per HPI, otherwise negative  Skin: Negative.   Neurological: Negative for syncope.      Allergies  Ace inhibitors and Adhesive  Home Medications   Prior to Admission medications   Medication Sig Start Date End Date Taking? Authorizing Provider  acetaminophen (TYLENOL) 500 MG tablet Take 500-1,000 mg by mouth every 6 (six) hours as needed for headache.    Historical Provider, MD  amLODipine (NORVASC) 5 MG tablet Take 1 tablet (5 mg total) by mouth daily. 08/08/12   Alison Murray, MD  carbamide peroxide (DEBROX) 6.5 % otic solution Place 5 drops into the left ear 2 (two) times daily. 10/29/14   Vivianne Master, PA-C  carvedilol (COREG) 25 MG tablet Take 2 tablets (50 mg total) by mouth 2 (two) times daily. 08/08/12  Alison Murray, MD  insulin NPH-regular Human (NOVOLIN 70/30) (70-30) 100 UNIT/ML injection Inject 30 Units into the skin 2 (two) times daily with a meal. 10/25/14   Jerald Kief, MD  isosorbide-hydrALAZINE (BIDIL) 20-37.5 MG per tablet Take 1 tablet by mouth 2 (two) times daily. 05/12/14   Catarina Hartshorn, MD  levothyroxine (SYNTHROID, LEVOTHROID) 137 MCG tablet Take 137 mcg by mouth daily. Name brand    Historical Provider, MD  loratadine-pseudoephedrine (CLARITIN-D 12 HOUR) 5-120 MG per tablet Take 1 tablet by mouth 2 (two) times daily. Take for 1 week, then stop. 01/03/15   Rodolph Bong, MD  Multiple Vitamin (MULTIVITAMIN WITH MINERALS) TABS tablet Take 1 tablet by mouth daily.    Historical Provider, MD  mycophenolate (CELLCEPT) 250 MG capsule Take 500 mg by mouth 2 (two) times daily.     Historical Provider, MD  predniSONE  (DELTASONE) 5 MG tablet Take 5 mg by mouth daily.      Historical Provider, MD  ranitidine (ZANTAC) 150 MG capsule Take 150 mg by mouth daily.     Historical Provider, MD  tacrolimus (PROGRAF) 1 MG capsule Take 1 mg by mouth 2 (two) times daily.     Historical Provider, MD   BP 97/46 mmHg  Pulse 72  Temp(Src) 97.5 F (36.4 C) (Oral)  Resp 16  SpO2 99%  LMP 11/19/2011 Physical Exam  Constitutional: She is oriented to person, place, and time. She appears well-developed and well-nourished. No distress.  HENT:  Head: Normocephalic and atraumatic.  Eyes: Conjunctivae and EOM are normal.  Cardiovascular: Normal rate and regular rhythm.   Pulmonary/Chest: Effort normal and breath sounds normal. No stridor. No respiratory distress.  Abdominal: She exhibits no distension. There is no tenderness.  Musculoskeletal: She exhibits no edema.  Neurological: She is alert and oriented to person, place, and time. No cranial nerve deficit.  Skin: Skin is warm and dry.  Psychiatric: She has a normal mood and affect.  Nursing note and vitals reviewed.   ED Course  Procedures (including critical care time) Labs Review Labs Reviewed  COMPREHENSIVE METABOLIC PANEL - Abnormal; Notable for the following:    Sodium 130 (*)    Potassium 5.2 (*)    Chloride 97 (*)    CO2 8 (*)    Glucose, Bld 696 (*)    BUN 39 (*)    Creatinine, Ser 1.80 (*)    Albumin 3.4 (*)    AST 13 (*)    Total Bilirubin 2.1 (*)    GFR calc non Af Amer 32 (*)    GFR calc Af Amer 37 (*)    Anion gap 25 (*)    All other components within normal limits  URINALYSIS, ROUTINE W REFLEX MICROSCOPIC (NOT AT [facility]) - Abnormal; Notable for the following:    APPearance CLEAR (*)    Glucose, UA >1000 (*)    Ketones, ur >80 (*)    All other components within normal limits  BLOOD GAS, VENOUS - Abnormal; Notable for the following:    pH, Ven 7.167 (*)    pCO2, Ven 22.5 (*)    pO2, Ven 47.3 (*)    Bicarbonate 7.8 (*)    Acid-base deficit  19.7 (*)    All other components within normal limits  URINE MICROSCOPIC-ADD ON - Abnormal; Notable for the following:    Squamous Epithelial / LPF FEW (*)    All other components within normal limits  CBG MONITORING, ED - Abnormal; Notable for  the following:    Glucose-Capillary >600 (*)    All other components within normal limits  CBG MONITORING, ED - Abnormal; Notable for the following:    Glucose-Capillary 533 (*)    All other components within normal limits  CBC WITH DIFFERENTIAL/PLATELET  I-STAT CG4 LACTIC ACID, ED   Patient was mildly hypotensive on arrival. IV fluids started.  EMS rhythm strip shows sinus rhythm, right bundle branch block, rate 69, abnormal   Initial values notable for pH 7.1.  IV fluids running  12:53 PM Repeat glucose slightly diminished. Remainder of labs consistent with DKA. Patient has started the insulin drip protocol.  MDM  Patient with insulin-dependent diabetes presents with multiple complaints, including weakness, lightheadedness, headache, vomiting. Patient is initially borderline hypotensive, but mentating appropriately. Patient received empiric fluid resuscitation, and initial differential included DKA, which was demonstrated on laboratory studies. After initial fluid bolus, patient was switched to insulin drip, continuous normal saline. Patient tolerated this well, but given her critical abnormalities, including acidosis, patient was admitted to the stepdown unit.  CRITICAL CARE Performed by: Gerhard Munch Total critical care time: 40 Critical care time was exclusive of separately billable procedures and treating other patients. Critical care was necessary to treat or prevent imminent or life-threatening deterioration. Critical care was time spent personally by me on the following activities: development of treatment plan with patient and/or surrogate as well as nursing, discussions with consultants, evaluation of patient's response  to treatment, examination of patient, obtaining history from patient or surrogate, ordering and performing treatments and interventions, ordering and review of laboratory studies, ordering and review of radiographic studies, pulse oximetry and re-evaluation of patient's condition.    Gerhard Munch, MD 03/02/15 1254

## 2015-03-02 NOTE — ED Notes (Signed)
Pt arrived via EMS with report of hyperglycemia at home CBG 440, pt having increase weakness, lightheadedness, and headache. Pt reported N/V x1 after meal consumption. CBG 559 with EMS. EMS given of NS.

## 2015-03-02 NOTE — ED Notes (Signed)
Bed: FX58 Expected date: 03/02/15 Expected time: 10:46 AM Means of arrival: Ambulance Comments: Hyperglycemia

## 2015-03-02 NOTE — ED Notes (Signed)
Lab called and reported Blood Glucose at 639. Dr. Jeraldine Loots aware.

## 2015-03-02 NOTE — H&P (Signed)
[facility] History and Physical  [name] GMW:[id number] DOB: [birth year] DOA: 03/02/2015  Referring physician: ER physician: Dr. Carmin Muskrat PCP: Donetta Potts, MD   Chief Complaint: headache, nausea, vomiting, weakness  HPI:  49 year old female with past medical history of ESRD and kidney transplant, on immunosuppressive therapy with Cellcept, prograf and prednisone, DM who presented to [facility] ED with main concern for worsening nausea, vomiting, lightheadedness and weakness. She could not tolerate any po intake and whatever she tried to eat she just had emesis after that. She did report taking less insulin that what her prescribed dose because she felt she was going to run out of insulin in less than a month. No fevers or chills. No hematemesis. No cough. No diarrhea or constipation. No abdominal pain. No blood in stool or urine. No urinary complaints. She felt lightheaded but has not passed out. No chest pain, palpitations of shortness of breath.   In ED, blood pressure was 94/45, afebrile and good oxygen saturation. Blood work showed CBG more than 600, UA showed presence of ketone, glucose of more than 1000. BMP showed glucose of 696 and AG of 25, CO2 8. DKA criteria met and she was started on insulin drip and admitted to SDU for further management of DKA.  Assessment & Plan    Principal Problem:   DKA, type 1 / High anion gap metabolic acidosis  - DKA criteria met high glucose and presence of ketones in urine. Pt also had high anion gap acidosis of 25. - Started on insulin drip per DKA protocol - Admission to SDU - Continue IV fluids, bolus IV fluids given in ED - Continue CBG and BMP checks per DKA protocol until CBG less tahn 250 or AG or CO2 normalizes  Active Problems:  End stage renal disease / H/O kidney transplant / Immunosuppression - Continue immunosuppressive therapy: Cellcept, prograf, prednisone    Diabetes mellitus with renal manifestations,  uncontrolled  - A1c in 12/2014 around 11 indicating poor glycemic control - Appreciate diabetic coordinator consult and recommendations - Now on insulin drip    Hypothyroidism - Continue synthroid     Hyperkalemia - From DKA  - Improved with insulin drip        DVT prophylaxis:  - SCD's bilaterally   Radiological Exams on Admission: No results found.   Code Status: Full Family Communication: Plan of care discussed with the patient  Disposition Plan: Admit for further evaluation, SDU due to DKA and requirement for insulin drip   Leisa Lenz, MD  [facility] Pager [phone]  Time spent in minutes: 75 minutes  Review of Systems:  Constitutional: Negative for fever, chills and malaise/fatigue. Negative for diaphoresis.  HENT: Negative for hearing loss, ear pain, nosebleeds, congestion, sore throat, neck pain, tinnitus and ear discharge.   Eyes: Negative for blurred vision, double vision, photophobia, pain, discharge and redness.  Respiratory: Negative for cough, hemoptysis, sputum production, shortness of breath, wheezing and stridor.   Cardiovascular: Negative for chest pain, palpitations, orthopnea, claudication and leg swelling.  Gastrointestinal: positive for nausea, vomiting and negative for abdominal pain. Negative for heartburn, constipation, blood in stool and melena.  Genitourinary: Negative for dysuria, urgency, frequency, hematuria and flank pain.  Musculoskeletal: Negative for myalgias, back pain, joint pain and falls.  Skin: Negative for itching and rash.  Neurological: Negative for dizziness and positive for weakness. Negative for tingling, tremors, sensory change, speech change, focal weakness, loss of consciousness Endo/Heme/Allergies: Negative for environmental allergies and polydipsia. Does  not bruise/bleed easily.  Psychiatric/Behavioral: Negative for suicidal ideas. The patient is not nervous/anxious.      Past Medical History  Diagnosis Date  .  Hypertension   . Hypothyroid   . Depression   . 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 Kidney disease   . End stage renal disease     right arm AV graft, post transplant  . Diabetes mellitus     Insulin dependant   Past Surgical History  Procedure Laterality Date  . Back surgery  2001  . Tubal ligation    . Dg av dialysis graft declot or    . Nephrectomy transplanted organ    . Kidney transplant  December 16, 2009  . 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  . Cataract extraction w/ intraocular lens implant Right   . Amputation Left 05/11/2014    Procedure: AMPUTATION LEFT GREAT TOE;  Surgeon: Mcarthur Rossetti, MD;  Location: [facility];  Service: Orthopedics;  Laterality: Left;   Social History:  reports that she quit smoking about 16 years ago. Her smoking use included Cigarettes. She has a 11 pack-year smoking history. She has never used smokeless tobacco. She reports that she drinks alcohol. She reports that she does not use illicit drugs.  Allergies  Allergen Reactions  . Ace Inhibitors Other (See Comments)    Cough   ( Lisinopril )  . Adhesive [Tape] Itching    Family History:  Family History  Problem Relation Age of Onset  . Emphysema Mother   . Throat cancer Mother   . COPD Mother   . Cancer Mother   . Emphysema Father   . COPD Father   . Stroke Father   . ADD / ADHD Son      Prior to Admission medications   Medication Sig Start Date End Date Taking? Authorizing Provider  acetaminophen (TYLENOL) 500 MG tablet Take 500-1,000 mg by mouth every 6 (six) hours as needed for headache.   Yes Historical Provider, MD  amLODipine (NORVASC) 5 MG tablet Take 1 tablet (5 mg total) by mouth daily. 08/08/12  Yes Robbie Lis, MD  carvedilol (COREG) 25 MG tablet Take 2 tablets (50 mg total) by mouth 2 (two) times daily. 08/08/12  Yes Robbie Lis, MD  insulin NPH-regular Human (NOVOLIN 70/30) (70-30) 100 UNIT/ML injection Inject 30 Units into the skin 2 (two) times  daily with a meal. 10/25/14  Yes Donne Hazel, MD  levothyroxine (SYNTHROID, LEVOTHROID) 137 MCG tablet Take 137 mcg by mouth daily. BRAND NAME ONLY   Yes Historical Provider, MD  mycophenolate (CELLCEPT) 250 MG capsule Take 500 mg by mouth 2 (two) times daily.    Yes Historical Provider, MD  predniSONE (DELTASONE) 5 MG tablet Take 5 mg by mouth daily.     Yes Historical Provider, MD  ranitidine (ZANTAC) 150 MG capsule Take 150 mg by mouth daily.    Yes Historical Provider, MD  tacrolimus (PROGRAF) 1 MG capsule Take 1 mg by mouth 2 (two) times daily.    Yes Historical Provider, MD  carbamide peroxide (DEBROX) 6.5 % otic solution Place 5 drops into the left ear 2 (two) times daily. Patient not taking: Reported on 03/02/2015 10/29/14   Brayton Caves, PA-C  isosorbide-hydrALAZINE (BIDIL) 20-37.5 MG per tablet Take 1 tablet by mouth 2 (two) times daily. Patient not taking: Reported on 03/02/2015 05/12/14   Orson Eva, MD  loratadine-pseudoephedrine (CLARITIN-D 12 HOUR) 5-120 MG per tablet  Take 1 tablet by mouth 2 (two) times daily. Take for 1 week, then stop. Patient not taking: Reported on 03/02/2015 01/03/15   Eugenie Filler, MD   Physical Exam: Filed Vitals:   03/02/15 1119 03/02/15 1135 03/02/15 1215 03/02/15 1246  BP: 97/46  96/43 94/45  Pulse: 72  74 74  Temp: 97.5 F (36.4 C)     TempSrc: Oral     Resp: '16  18 18  ' SpO2: 98% 99% 98% 97%    Physical Exam  Constitutional: Appears well-developed and well-nourished. No distress.  HENT: Normocephalic. No tonsillar erythema or exudates Eyes: Conjunctivae and EOM are normal. PERRLA, no scleral icterus.  Neck: Normal ROM. Neck supple. No JVD. No tracheal deviation. No thyromegaly.  CVS: RRR, S1/S2 +, no murmurs, no gallops, no carotid bruit.  Pulmonary: Effort and breath sounds normal, no stridor, rhonchi, wheezes, rales.  Abdominal: Soft. BS +,  no distension, tenderness, rebound or guarding.  Musculoskeletal: Normal range of motion. No  edema and no tenderness.  Lymphadenopathy: No lymphadenopathy noted, cervical, inguinal. Neuro: Alert. Normal reflexes, muscle tone coordination. No focal neurologic deficits. Skin: Skin is warm and dry. No rash noted.  No erythema. No pallor.  Psychiatric: Normal mood and affect. Behavior, judgment, thought content normal.   Labs on Admission:  Basic Metabolic Panel:  Recent Labs Lab 03/02/15 1156  NA 130*  K 5.2*  CL 97*  CO2 8*  GLUCOSE 696*  BUN 39*  CREATININE 1.80*  CALCIUM 10.0   Liver Function Tests:  Recent Labs Lab 03/02/15 1156  AST 13*  ALT 15  ALKPHOS 118  BILITOT 2.1*  PROT 6.6  ALBUMIN 3.4*   No results for input(s): LIPASE, AMYLASE in the last 168 hours. No results for input(s): AMMONIA in the last 168 hours. CBC:  Recent Labs Lab 03/02/15 1156  WBC 6.5  NEUTROABS 4.9  HGB 13.7  HCT 42.2  MCV 91.9  PLT 170   Cardiac Enzymes: No results for input(s): CKTOTAL, CKMB, CKMBINDEX, TROPONINI in the last 168 hours. BNP: Invalid input(s): POCBNP CBG:  Recent Labs Lab 03/02/15 1123 03/02/15 1245  GLUCAP >600* 533*    If 7PM-7AM, please contact night-coverage [email or web address] Password [email or web address] 03/02/2015, 1:00 PM

## 2015-03-03 LAB — GLUCOSE, CAPILLARY
GLUCOSE-CAPILLARY: 109 mg/dL — AB (ref 65–99)
GLUCOSE-CAPILLARY: 109 mg/dL — AB (ref 65–99)
GLUCOSE-CAPILLARY: 160 mg/dL — AB (ref 65–99)
GLUCOSE-CAPILLARY: 276 mg/dL — AB (ref 65–99)
GLUCOSE-CAPILLARY: 97 mg/dL (ref 65–99)
Glucose-Capillary: 360 mg/dL — ABNORMAL HIGH (ref 65–99)
Glucose-Capillary: 189 mg/dL — ABNORMAL HIGH (ref 65–99)
Glucose-Capillary: 168 mg/dL — ABNORMAL HIGH (ref 65–99)
Glucose-Capillary: 136 mg/dL — ABNORMAL HIGH (ref 65–99)
Glucose-Capillary: 98 mg/dL (ref 65–99)
Glucose-Capillary: 126 mg/dL — ABNORMAL HIGH (ref 65–99)
Glucose-Capillary: 177 mg/dL — ABNORMAL HIGH (ref 65–99)
Glucose-Capillary: 130 mg/dL — ABNORMAL HIGH (ref 65–99)
Glucose-Capillary: 176 mg/dL — ABNORMAL HIGH (ref 65–99)
Glucose-Capillary: 263 mg/dL — ABNORMAL HIGH (ref 65–99)
Glucose-Capillary: 234 mg/dL — ABNORMAL HIGH (ref 65–99)

## 2015-03-03 LAB — BASIC METABOLIC PANEL
Anion gap: 12 (ref 5–15)
Anion gap: 11 (ref 5–15)
BUN: 36 mg/dL — ABNORMAL HIGH (ref 6–20)
BUN: 36 mg/dL — AB (ref 6–20)
CALCIUM: 9.9 mg/dL (ref 8.9–10.3)
CHLORIDE: 107 mmol/L (ref 101–111)
CO2: 20 mmol/L — ABNORMAL LOW (ref 22–32)
CO2: 18 mmol/L — ABNORMAL LOW (ref 22–32)
Calcium: 9.6 mg/dL (ref 8.9–10.3)
Chloride: 107 mmol/L (ref 101–111)
Creatinine, Ser: 1.34 mg/dL — ABNORMAL HIGH (ref 0.44–1.00)
Creatinine, Ser: 1.33 mg/dL — ABNORMAL HIGH (ref 0.44–1.00)
GFR calc non Af Amer: 46 mL/min — ABNORMAL LOW (ref >60)
GFR, EST AFRICAN AMERICAN: 53 mL/min — AB (ref >60)
GFR, EST AFRICAN AMERICAN: 54 mL/min — AB (ref >60)
GFR, EST NON AFRICAN AMERICAN: 46 mL/min — AB (ref >60)
GLUCOSE: 158 mg/dL — AB (ref 65–99)
Glucose, Bld: 93 mg/dL (ref 65–99)
POTASSIUM: 4 mmol/L (ref 3.5–5.1)
Potassium: 4.3 mmol/L (ref 3.5–5.1)
Sodium: 139 mmol/L (ref 135–145)
Sodium: 136 mmol/L (ref 135–145)

## 2015-03-03 MED ORDER — INSULIN ASPART 100 UNIT/ML ~~LOC~~ SOLN: 0.0000 [IU] | SUBCUTANEOUS | Status: DC

## 2015-03-03 MED ORDER — KETOROLAC TROMETHAMINE 30 MG/ML IJ SOLN
30.0000 mg | Freq: Four times a day (QID) | INTRAMUSCULAR | Status: DC | PRN
  Administered 2015-03-03 – 2015-03-04 (×3): 30 mg via INTRAVENOUS
  Filled 2015-03-03 (×3): qty 1

## 2015-03-03 MED ORDER — INSULIN ASPART PROT & ASPART (70-30 MIX) 100 UNIT/ML ~~LOC~~ SUSP
15.0000 [IU] | Freq: Two times a day (BID) | SUBCUTANEOUS | Status: DC
  Administered 2015-03-03 – 2015-03-04 (×3): 15 [IU] via SUBCUTANEOUS
  Filled 2015-03-03 (×2): qty 10

## 2015-03-03 MED ORDER — INSULIN ASPART 100 UNIT/ML ~~LOC~~ SOLN
0.0000 [IU] | Freq: Three times a day (TID) | SUBCUTANEOUS | Status: DC
  Administered 2015-03-03: 8 [IU] via SUBCUTANEOUS
  Administered 2015-03-03: 2 [IU] via SUBCUTANEOUS
  Administered 2015-03-03: 4 [IU] via SUBCUTANEOUS
  Administered 2015-03-03 – 2015-03-04 (×2): 12 [IU] via SUBCUTANEOUS
  Administered 2015-03-04: 16 [IU] via SUBCUTANEOUS
  Administered 2015-03-04: 4 [IU] via SUBCUTANEOUS

## 2015-03-03 MED ORDER — INSULIN ASPART 100 UNIT/ML ~~LOC~~ SOLN
0.0000 [IU] | SUBCUTANEOUS | Status: DC
  Administered 2015-03-03: 4 [IU] via SUBCUTANEOUS

## 2015-03-03 MED ORDER — INSULIN ASPART PROT & ASPART (70-30 MIX) 100 UNIT/ML ~~LOC~~ SUSP: 20.0000 [IU] | Freq: Two times a day (BID) | SUBCUTANEOUS | Status: DC

## 2015-03-03 MED ORDER — SODIUM CHLORIDE 0.9 % IV SOLN
INTRAVENOUS | Status: DC
  Administered 2015-03-03 (×2): via INTRAVENOUS

## 2015-03-03 MED ORDER — INSULIN ASPART PROT & ASPART (70-30 MIX) 100 UNIT/ML ~~LOC~~ SUSP: 30.0000 [IU] | Freq: Two times a day (BID) | SUBCUTANEOUS | Status: DC

## 2015-03-03 MED ORDER — INSULIN DETEMIR 100 UNIT/ML ~~LOC~~ SOLN
20.0000 [IU] | Freq: Once | SUBCUTANEOUS | Status: AC
  Administered 2015-03-03: 20 [IU] via SUBCUTANEOUS
  Filled 2015-03-03: qty 0.2

## 2015-03-03 NOTE — Care Management Note (Signed)
Case Management Note  Patient Details  Name: DEMANI ZULOAGA MRN: [id number] Date of Birth: Sep 20, 1965  Subjective/Objective:                  headache, nausea, vomiting, weakness  Action/Plan: Discharge Planning  Expected Discharge Date:  03/06/15               Expected Discharge Plan:  Home/Self Care  In-House Referral:     Discharge planning Services  CM Consult  Post Acute Care Choice:    Choice offered to:     DME Arranged:    DME Agency:     HH Arranged:    HH Agency:     Status of Service:  Completed, signed off  Medicare Important Message Given:    Date Medicare IM Given:    Medicare IM give by:    Date Additional Medicare IM Given:    Additional Medicare Important Message give by:     If discussed at Long Length of Stay Meetings, dates discussed:    Additional Comments:  CM spoke with patient at the bedside. Reports she buys her 70/30 Insulin at [facility] for $25 without using her prescription drug coverage. She is not sure what her co-pay would be using her Part D. Informed patient per [facility], 70/30 may cost a little as $1.00 depending on her plan. [facility] requests she bring a prescription and her insurance card to allow them to run the claim for cost. The pharmacy does not have a prescription on file for the patient's insulin. Patient verbalizes understanding to take her prescription and insurance card to the pharmacy.  Antony Haste, RN 03/03/2015, 3:51 PM

## 2015-03-03 NOTE — Progress Notes (Signed)
Inpatient Diabetes Program Recommendations  AACE/ADA: New Consensus Statement on Inpatient Glycemic Control (2013)  Target Ranges:  Prepandial:   less than 140 mg/dL      Peak postprandial:   less than 180 mg/dL (1-2 hours)      Critically ill patients:  140 - 180 mg/dL   Inpatient Diabetes Program Recommendations Correction (SSI): change from 0-24 correction to moderate scale TID per Glycemic Control order set  Agree with current orders for 1/2 home dose 70/30.  Adjust as needed.  Thank you  Piedad Climes BSN, RN,CDE Inpatient Diabetes Coordinator [phone] (team pager)

## 2015-03-03 NOTE — Progress Notes (Signed)
Patient ID: [name], female   DOB: 27-Sep-1965, 49 y.o.   MRN: [id number] [facility] PROGRESS NOTE  DALLAS SCORSONE HKV:[id number] DOB: March 12, 1966 DOA: 03/02/2015 PCP: Donetta Potts, MD  Brief narrative:    49 year old female with past medical history of ESRD and kidney transplant, on immunosuppressive therapy with Cellcept, prograf and prednisone, DM who presented to [facility] ED with main concern for worsening nausea, vomiting, lightheadedness and weakness. She took less insulin than what her prescribed dose is due to concern she will run out of insulin in less than a month.   In ED, blood pressure was 94/45, afebrile and good oxygen saturation. Blood work showed CBG more than 600, UA showed presence of ketone, glucose of more than 1000. BMP showed glucose of 696 and AG of 25, CO2 8. DKA criteria met and she was started on insulin drip and admitted to SDU for further management of DKA.  Assessment/Plan:    Principal Problem:  DKA, type 1 / High anion gap metabolic acidosis  - DKA criteria met on admission with high glucose and presence of ketones in urine. Pt also had high anion gap acidosis of 25. BMP showed glucose in 600 range. - She was started on insulin drip per DKA protocol and admitted to SDU unit - This am, AG closed and she is off of insulin drip - Resume insulin regimen however 15 units BID instead of 30 units BID due to relatively poor po intake   Active Problems: End stage renal disease / H/O kidney transplant / Immunosuppression - Continue immunosuppressive therapy: Cellcept, prograf, prednisone   Diabetes mellitus with renal manifestations, uncontrolled  - A1c in 12/2014 around 11 indicating poor glycemic control - Appreciate diabetic coordinator consult and recommendations   Hypothyroidism - Continue synthroid    Hyperkalemia - From DKA  - Resolved with insulin     DVT Prophylaxis  - SCD's bilaterally   Code Status: Full.  Family  Communication:  plan of care discussed with the patient Disposition Plan: transfer to telemetry floor    IV access:  Peripheral IV  Procedures and diagnostic studies:    No results found.  Medical Consultants:  None   Other Consultants:  DM coordinator Nutrition  IAnti-Infectives:   None    Adalyne Lovick, MD  [facility] Pager [phone]  Time spent in minutes: 25 minutes  If 7PM-7AM, please contact night-coverage [email or web address] Password [email or web address] 03/03/2015, 8:29 AM   LOS: 1 day    HPI/Subjective: No acute overnight events. Patient reports feeling tired, weak.  Objective: Filed Vitals:   03/03/15 0400 03/03/15 0500 03/03/15 0600 03/03/15 0800  BP: 121/34  137/49 152/68  Pulse: 73 75 77 81  Temp: 98.6 F (37 C)     TempSrc:      Resp: '14 15 16 15  ' Height:      Weight:      SpO2: 93% 93% 94% 98%    Intake/Output Summary (Last 24 hours) at 03/03/15 0829 Last data filed at 03/03/15 0800  Gross per 24 hour  Intake 2175.62 ml  Output    400 ml  Net 1775.62 ml    Exam:   General:  Pt is alert, follows commands appropriately, not in acute distress  Cardiovascular: Regular rate and rhythm, S1/S2, no murmurs  Respiratory: Clear to auscultation bilaterally, no wheezing, no crackles, no rhonchi  Abdomen: Soft, non tender, non distended, bowel sounds present  Extremities: No edema, pulses DP and PT palpable bilaterally  Neuro:  Grossly nonfocal  Data Reviewed: Basic Metabolic Panel:  Recent Labs Lab 03/02/15 1156 03/02/15 1720 03/02/15 2103 03/03/15 0150 03/03/15 0530  NA 130* 135 137 139 136  K 5.2* 4.1 4.1 4.0 4.3  CL 97* 104 107 107 107  CO2 8* 9* 18* 20* 18*  GLUCOSE 696* 431* 209* 93 158*  BUN 39* 39* 39* 36* 36*  CREATININE 1.80* 1.92* 1.65* 1.34* 1.33*  CALCIUM 10.0 9.8 9.7 9.9 9.6   Liver Function Tests:  Recent Labs Lab 03/02/15 1156  AST 13*  ALT 15  ALKPHOS 118  BILITOT 2.1*  PROT 6.6  ALBUMIN 3.4*   No results for  input(s): LIPASE, AMYLASE in the last 168 hours. No results for input(s): AMMONIA in the last 168 hours. CBC:  Recent Labs Lab 03/02/15 1156  WBC 6.5  NEUTROABS 4.9  HGB 13.7  HCT 42.2  MCV 91.9  PLT 170   Cardiac Enzymes: No results for input(s): CKTOTAL, CKMB, CKMBINDEX, TROPONINI in the last 168 hours. BNP: Invalid input(s): POCBNP CBG:  Recent Labs Lab 03/02/15 1459 03/02/15 1602 03/02/15 1657 03/02/15 2003 03/02/15 2102  GLUCAP 577* 495* 362* 226* 189*    Recent Results (from the past 240 hour(s))  MRSA PCR Screening     Status: Abnormal   Collection Time: 03/02/15  1:43 PM  Result Value Ref Range Status   MRSA by PCR POSITIVE (A) NEGATIVE Final     Scheduled Meds: . Chlorhexidine Gluconate Cloth  6 each Topical Q0600  . famotidine  20 mg Oral Daily  . insulin aspart  0-24 Units Subcutaneous TID AC & HS  . levothyroxine  137 mcg Oral QAC breakfast  . mupirocin ointment  1 application Nasal BID  . mycophenolate  500 mg Oral BID  . predniSONE  5 mg Oral Daily  . sodium chloride  3 mL Intravenous Q12H  . tacrolimus  1 mg Oral BID   Continuous Infusions: . sodium chloride 100 mL/hr at 03/03/15 0600

## 2015-03-04 LAB — GLUCOSE, CAPILLARY
GLUCOSE-CAPILLARY: 589 mg/dL — AB (ref 65–99)
Glucose-Capillary: 255 mg/dL — ABNORMAL HIGH (ref 65–99)
Glucose-Capillary: 185 mg/dL — ABNORMAL HIGH (ref 65–99)
Glucose-Capillary: 307 mg/dL — ABNORMAL HIGH (ref 65–99)

## 2015-03-04 LAB — HEMOGLOBIN A1C
HEMOGLOBIN A1C: 11.6 % — AB (ref 4.8–5.6)
MEAN PLASMA GLUCOSE: 286 mg/dL

## 2015-03-04 MED ORDER — LEVOTHYROXINE SODIUM 137 MCG PO TABS: 137.0000 ug | ORAL_TABLET | Freq: Every day | ORAL | Status: DC

## 2015-03-04 MED ORDER — AMLODIPINE BESYLATE 5 MG PO TABS: 5.0000 mg | ORAL_TABLET | Freq: Every day | ORAL | Status: DC

## 2015-03-04 MED ORDER — CARVEDILOL 25 MG PO TABS: 50.0000 mg | ORAL_TABLET | Freq: Two times a day (BID) | ORAL | Status: DC

## 2015-03-04 MED ORDER — INSULIN NPH ISOPHANE & REGULAR (70-30) 100 UNIT/ML ~~LOC~~ SUSP: 30.0000 [IU] | Freq: Two times a day (BID) | SUBCUTANEOUS | Status: DC

## 2015-03-04 NOTE — Progress Notes (Signed)
[facility] was called to make an appointment. VM left with [facility] Director.  Unable to make appointment at [facility] for pt at this time. Pt is aware that she will need to call to make appointment herself. Information on follow up appointment with number. Pt states that she would call, so that she can make arrangements for transportation to appointment.

## 2015-03-04 NOTE — Progress Notes (Signed)
Inpatient Diabetes Program Recommendations  AACE/ADA: New Consensus Statement on Inpatient Glycemic Control (2013)  Target Ranges:  Prepandial:   less than 140 mg/dL      Peak postprandial:   less than 180 mg/dL (1-2 hours)      Critically ill patients:  140 - 180 mg/dL   Reason for Visit: Diabetes Consult  Briefly spoke with pt regarding HgbA1C of 11.6%. Pt states her blood sugars often are high or low. Checks CBGs 3x/day.  Discussed hypoglycemia s/s and treatment. Pt states she eats candy bars and drinks juice when her blood sugar is low. Discussed using 1/2 c juice or reg soda and rechecking in 15 min. Discussed importance of f/u with PCP to manage DM. Needs to take logbook to MD office for any needed adjustments. Pt voiced understanding. [facility] Instructed pt if she had problems getting through, just go there and plan to wait to be seen. Appeared interested in trying to improve diet and use hypoglycemia treatment we discussed.  Discussed above with RN. Thank you. Ailene Ardshonda Zaydrian Batta, RD, LDN, CDE Inpatient Diabetes Coordinator [phone]

## 2015-03-04 NOTE — Progress Notes (Signed)
NUTRITION NOTE   RD consulted for nutrition education regarding diabetes.   Lab Results  Component Value Date   HGBA1C 11.6* 03/02/2015    RD provided "Type 1 Diabetes Nutrition Therapy" handout from the Academy of Nutrition and Dietetics. Discussed different food groups and their effects on blood sugar, emphasizing carbohydrate-containing foods. Provided list of carbohydrates and recommended serving sizes of common foods.  Discussed importance of controlled and consistent carbohydrate intake throughout the day. Provided examples of ways to balance meals/snacks and encouraged intake of high-fiber, whole grain complex carbohydrates. Teach back method used.  Expect good to fair compliance.  Body mass index is 32.66 kg/(m^2). Pt meets criteria for obesity based on current BMI.  Current diet order is Carb Modified, patient is consuming approximately 100% of meals at this time. Labs and medications reviewed. No further nutrition interventions warranted at this time. RD contact information provided. If additional nutrition issues arise, please re-consult RD.    [name], RD, LDN Inpatient Clinical Dietitian Pager # [phone] After hours/weekend pager # [phone]

## 2015-03-04 NOTE — Progress Notes (Signed)
CSW received referral that pt needing assistance with obtaining medication.   Inappropriate CSW referral. CSW to notify RNCM who assist with medication needs.  CSW signing off.   Please re-consult if social work needs arise.   Loletta SpecterSuzanna Kidd, MSW, LCSW Clinical Social Work [phone]

## 2015-03-04 NOTE — Care Management (Signed)
IM LETTER GIVEN TO RN IFY

## 2015-03-04 NOTE — Discharge Instructions (Signed)
Blood Glucose Monitoring °Monitoring your blood glucose (also know as blood sugar) helps you to manage your diabetes. It also helps you and your health care provider monitor your diabetes and determine how well your treatment plan is working. °WHY SHOULD YOU MONITOR YOUR BLOOD GLUCOSE? °· It can help you understand how food, exercise, and medicine affect your blood glucose. °· It allows you to know what your blood glucose is at any given moment. You can quickly tell if you are having low blood glucose (hypoglycemia) or high blood glucose (hyperglycemia). °· It can help you and your health care provider know how to adjust your medicines. °· It can help you understand how to manage an illness or adjust medicine for exercise. °WHEN SHOULD YOU TEST? °Your health care provider will help you decide how often you should check your blood glucose. This may depend on the type of diabetes you have, your diabetes control, or the types of medicines you are taking. Be sure to write down all of your blood glucose readings so that this information can be reviewed with your health care provider. See below for examples of testing times that your health care provider may suggest. °Type 1 Diabetes °· Test 4 times a day if you are in good control, using an insulin pump, or perform multiple daily injections. °· If your diabetes is not well controlled or if you are sick, you may need to monitor more often. °· It is a good idea to also monitor: °· Before and after exercise. °· Between meals and 2 hours after a meal. °· Occasionally between 2:00 a.m. and 3:00 a.m. °Type 2 Diabetes °· It can vary with each person, but generally, if you are on insulin, test 4 times a day. °· If you take medicines by mouth (orally), test 2 times a day. °· If you are on a controlled diet, test once a day. °· If your diabetes is not well controlled or if you are sick, you may need to monitor more often. °HOW TO MONITOR YOUR BLOOD GLUCOSE °Supplies  Needed °· Blood glucose meter. °· Test strips for your meter. Each meter has its own strips. You must use the strips that go with your own meter. °· A pricking needle (lancet). °· A device that holds the lancet (lancing device). °· A journal or log book to write down your results. °Procedure °· Wash your hands with soap and water. Alcohol is not preferred. °· Prick the side of your finger (not the tip) with the lancet. °· Gently milk the finger until a small drop of blood appears. °· Follow the instructions that come with your meter for inserting the test strip, applying blood to the strip, and using your blood glucose meter. °Other Areas to Get Blood for Testing °Some meters allow you to use other areas of your body (other than your finger) to test your blood. These areas are called alternative sites. The most common alternative sites are: °· The forearm. °· The thigh. °· The back area of the lower leg. °· The palm of the hand. °The blood flow in these areas is slower. Therefore, the blood glucose values you get may be delayed, and the numbers are different from what you would get from your fingers. Do not use alternative sites if you think you are having hypoglycemia. Your reading will not be accurate. Always use a finger if you are having hypoglycemia. Also, if you cannot feel your lows (hypoglycemia unawareness), always use your fingers for your   blood glucose checks. ADDITIONAL TIPS FOR GLUCOSE MONITORING  Do not reuse lancets.  Always carry your supplies with you.  All blood glucose meters have a 24-hour "hotline" number to call if you have questions or need help.  Adjust (calibrate) your blood glucose meter with a control solution after finishing a few boxes of strips. BLOOD GLUCOSE RECORD KEEPING It is a good idea to keep a daily record or log of your blood glucose readings. Most glucose meters, if not all, keep your glucose records stored in the meter. Some meters come with the ability to download  your records to your home computer. Keeping a record of your blood glucose readings is especially helpful if you are wanting to look for patterns. Make notes to go along with the blood glucose readings because you might forget what happened at that exact time. Keeping good records helps you and your health care provider to work together to achieve good diabetes management.  Document Released: 08/27/2003 Document Revised: 01/08/2014 Document Reviewed: 01/16/2013 [facility] Patient Information 2015 [place], [place]. This information is not intended to replace advice given to you by your health care provider. Make sure you discuss any questions you have with your health care provider.  Diabetes Mellitus and Food It is important for you to manage your blood sugar (glucose) level. Your blood glucose level can be greatly affected by what you eat. Eating healthier foods in the appropriate amounts throughout the day at about the same time each day will help you control your blood glucose level. It can also help slow or prevent worsening of your diabetes mellitus. Healthy eating may even help you improve the level of your blood pressure and reach or maintain a healthy weight.  HOW CAN FOOD AFFECT ME? Carbohydrates Carbohydrates affect your blood glucose level more than any other type of food. Your dietitian will help you determine how many carbohydrates to eat at each meal and teach you how to count carbohydrates. Counting carbohydrates is important to keep your blood glucose at a healthy level, especially if you are using insulin or taking certain medicines for diabetes mellitus. Alcohol Alcohol can cause sudden decreases in blood glucose (hypoglycemia), especially if you use insulin or take certain medicines for diabetes mellitus. Hypoglycemia can be a life-threatening condition. Symptoms of hypoglycemia (sleepiness, dizziness, and disorientation) are similar to symptoms of having too much alcohol.  If your health  care provider has given you approval to drink alcohol, do so in moderation and use the following guidelines:  Women should not have more than one drink per day, and men should not have more than two drinks per day. One drink is equal to:  12 oz of beer.  5 oz of wine.  1 oz of hard liquor.  Do not drink on an empty stomach.  Keep yourself hydrated. Have water, diet soda, or unsweetened iced tea.  Regular soda, juice, and other mixers might contain a lot of carbohydrates and should be counted. WHAT FOODS ARE NOT RECOMMENDED? As you make food choices, it is important to remember that all foods are not the same. Some foods have fewer nutrients per serving than other foods, even though they might have the same number of calories or carbohydrates. It is difficult to get your body what it needs when you eat foods with fewer nutrients. Examples of foods that you should avoid that are high in calories and carbohydrates but low in nutrients include:  Trans fats (most processed foods list trans fats on the  Nutrition Facts label).  Regular soda.  Juice.  Candy.  Sweets, such as cake, pie, doughnuts, and cookies.  Fried foods. WHAT FOODS CAN I EAT? Have nutrient-rich foods, which will nourish your body and keep you healthy. The food you should eat also will depend on several factors, including:  The calories you need.  The medicines you take.  Your weight.  Your blood glucose level.  Your blood pressure level.  Your cholesterol level. You also should eat a variety of foods, including:  Protein, such as meat, poultry, fish, tofu, nuts, and seeds (lean animal proteins are best).  Fruits.  Vegetables.  Dairy products, such as milk, cheese, and yogurt (low fat is best).  Breads, grains, pasta, cereal, rice, and beans.  Fats such as olive oil, trans fat-free margarine, canola oil, avocado, and olives. DOES EVERYONE WITH DIABETES MELLITUS HAVE THE SAME MEAL PLAN? Because every  person with diabetes mellitus is different, there is not one meal plan that works for everyone. It is very important that you meet with a dietitian who will help you create a meal plan that is just right for you. Document Released: 05/21/2005 Document Revised: 08/29/2013 Document Reviewed: 07/21/2013 [facility] Patient Information 2015 [place], [place]. This information is not intended to replace advice given to you by your health care provider. Make sure you discuss any questions you have with your health care provider.  Diabetic Ketoacidosis Diabetic ketoacidosis (DKA) is a life-threatening complication of type 1 diabetes. It must be quickly recognized and treated. Treatment requires hospitalization. CAUSES  When there is no insulin in the body, glucose (sugar) cannot be used, and the body breaks down fat for energy. When fat breaks down, acids (ketones) build up in the blood. Very high levels of glucose and high levels of acids lead to severe loss of body fluids (dehydration) and other dangerous chemical changes. This stresses your vital organs and can cause coma or death. SIGNS AND SYMPTOMS   Tiredness (fatigue).  Weight loss.  Excessive thirst.  Ketones in your urine.  Light-headedness.  Fruity or sweet smelling breath.  Excessive urination.  Visual changes.  Confusion or irritability.  Nausea or vomiting.  Rapid breathing.  Stomachache or abdominal pain. DIAGNOSIS  Your health care provider will diagnose DKA based on your history, physical exam, and blood tests. The health care provider will check to see if you have another illness that caused you to go into DKA. Most of this will be done quickly in an emergency room. TREATMENT   Fluid replacement to correct dehydration.  Insulin.  Correction of electrolytes, such as potassium and sodium.  Antibiotic medicines. PREVENTION  Always take your insulin. Do not skip your insulin injections.  If you are sick, treat  yourself quickly. Your body often needs more insulin to fight the illness.  Check your blood glucose regularly.  Check urine ketones if your blood glucose is greater than 240 milligrams per deciliter (mg/dL).  Do not use outdated (expired) insulin.  If your blood glucose is high, drink plenty of fluids. This helps flush out ketones. HOME CARE INSTRUCTIONS   If you are sick, follow the advice of your health care provider.  To prevent dehydration, drink enough water and fluids to keep your urine clear or pale yellow.  If you cannot eat, alternate between drinking fluids with sugar (soda, juices, flavored gelatin) and salty fluids (broth, bouillon).  If you can eat, follow your usual diet and drink sugar-free liquids (water, diet drinks).  Always take your  usual dose of insulin. If you cannot eat or if your glucose is getting too low, call your health care provider for further instructions.  Continue to monitor your blood or urine ketones every 3-4 hours around the clock. Set your alarm clock or have someone wake you up. If you are too sick, have someone test it for you.  Rest and avoid exercise. SEEK MEDICAL CARE IF:   You have a fever.  You have ketones in your urine, or your blood glucose is higher than a level your health care provider suggests. You may need extra insulin. Call your health care provider if you need advice on adjusting your insulin.  You cannot drink at least a tablespoon (15 mL) of fluid every 15-20 minutes.  You have been vomiting for more than 2 hours.  You have symptoms of DKA:  Fruity smelling breath.  Breathing faster or slower.  Becoming very sleepy. SEEK IMMEDIATE MEDICAL CARE IF:   You have signs of dehydration:  Decreased urination.  Increased thirst.  Dry skin and mouth.  Light-headedness.  Your blood glucose is very high (as advised by your health care provider) twice in a row.  You faint.  You have chest pain or trouble  breathing.  You have a sudden, severe headache.  You have sudden weakness in one arm or one leg.  You have sudden trouble speaking or swallowing.  You have vomiting or diarrhea that is getting worse after 3 hours.  You have abdominal pain. MAKE SURE YOU:   Understand these instructions.  Will watch your condition.  Will get help right away if you are not doing well or get worse. Document Released: 08/21/2000 Document Revised: 08/29/2013 Document Reviewed: 02/27/2009 [facility] Patient Information 2015 [place], [place]. This information is not intended to replace advice given to you by your health care provider. Make sure you discuss any questions you have with your health care provider.

## 2015-03-04 NOTE — Progress Notes (Signed)
Patient discharged home with family, discharge instructions given and explained to patient and she verbalized understanding, denies any pain/distress. Skin intact. 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  Accompanied home by friend.

## 2015-03-04 NOTE — Care Management Note (Signed)
Case Management Note  Patient Details  Name: [name] MRN: [id number] Date of Birth: May 17, 1966  Subjective/Objective:     DKA               Action/Plan: from home plan to return home   Expected Discharge Date:  03/06/15               Expected Discharge Plan:  Home/Self Care  In-House Referral:     Discharge planning Services  CM Consult  Post Acute Care Choice:    Choice offered to:     DME Arranged:    DME Agency:     HH Arranged:    HH Agency:     Status of Service:  Completed, signed off  Medicare Important Message Given:    Date Medicare IM Given:    Medicare IM give by:    Date Additional Medicare IM Given:    Additional Medicare Important Message give by:     If discussed at Long Length of Stay Meetings, dates discussed:    Additional Comments:[facility] was called to make an appointment. VM left with [facility] Director. [facility]. Pt is aware that she will need to call to make appointment herself. Information on follow up appointment with number. Pt states that she would call, so that she can make arrangements for transportation to appointment.  Geni BersMcGibboney, Mccauley Diehl, RN 03/04/2015, 11:53 AM

## 2015-03-04 NOTE — Discharge Summary (Signed)
Physician Discharge Summary  [name] HER:[id number] DOB: 01-31-1966 DOA: 03/02/2015  PCP: Donetta Potts, MD  Admit date: 03/02/2015 Discharge date: 03/04/2015  Recommendations for Outpatient Follow-up:  1. No new changes in medications. Patient was given prescriptions for her insulin, Synthroid, Norvasc and Coreg with 1 refill. 2. She was advised to follow-up in [facility], she does not currently have a primary care physician.  Discharge Diagnoses:  Principal Problem:   DKA, type 1 Active Problems:   High anion gap metabolic acidosis   Immunosuppression   Hypothyroidism   Hyperkalemia   H/O kidney transplant   End stage renal disease    Discharge Condition: stable   Diet recommendation: as tolerated   History of present illness:  49 year old female with past medical history of ESRD and kidney transplant, on immunosuppressive therapy with Cellcept, prograf and prednisone, DM who presented to [facility] ED with main concern for worsening nausea, vomiting, lightheadedness and weakness. She took less insulin than what her prescribed dose is due to concern she will run out of insulin in less than a month.   In ED, blood pressure was 94/45, afebrile and good oxygen saturation. Blood work showed CBG more than 600, UA showed presence of ketone, glucose of more than 1000. BMP showed glucose of 696 and AG of 25, CO2 8. DKA criteria met and she was started on insulin drip and admitted to SDU for further management of DKA.   Hospital Course:    Assessment/Plan:    Principal Problem:  DKA, type 1 / High anion gap metabolic acidosis  - DKA criteria met on admission with high glucose and presence of ketones in urine. Pt also had high anion gap acidosis of 25. BMP showed glucose in 600 range. - Patient was started on insulin drip for the time of the admission and she was admitted to stepdown unit. - Patient was transferred to telemetry unit at 03/03/2015. - She  was subsequently transitioned to subcutaneous insulin the following day when anion gap closed and CBGs improved. - On discharge, patient will continue insulin per prior home regimen.  Active Problems: End stage renal disease / H/O kidney transplant / Immunosuppression - Continue immunosuppressive therapy: Cellcept, prograf, prednisone  - Patient follows in [facility] in regards to this issue  Diabetes mellitus with renal manifestations, uncontrolled  - A1c in 12/2014 around 11 indicating poor glycemic control - Appreciate diabetic coordinator consult  - Patient will resume taking insulin regimen per prior home regimen. The reason why she is not taking the full dose of insulin is because she does not have a primary care physician, she does not have transportation provided to follow-up on her scheduled appointments. I have asked if case manager can establish primary care provider for this patient in [facility] who can continue to give prescriptions for her medical comorbidities.    Hypothyroidism - Continue synthroid on discharge   Hyperkalemia - From DKA  - Resolved with insulin     DVT Prophylaxis  - SCD's bilaterally in hospital  Code Status: Full.  Family Communication: plan of care discussed with the patient  IV access:  Peripheral IV  Procedures and diagnostic studies:   No results found.  Medical Consultants:  None   Other Consultants:  DM coordinator Nutrition  IAnti-Infectives:   None      Signed:  Leisa Lenz, MD  [facility] 03/04/2015, 9:58 AM  Pager #: [phone]  Time spent in minutes:  more than 30 minutes  Discharge Exam: Filed Vitals:   03/04/15 0900  BP: 160/80  Pulse: 82  Temp: 97.7 F (36.5 C)  Resp: 16   Filed Vitals:   03/03/15 1355 03/03/15 2116 03/04/15 0539 03/04/15 0900  BP: 129/55 150/67 150/69 160/80  Pulse: 81 82 78 82  Temp: 97.7 F (36.5 C) 98 F  (36.7 C) 97.7 F (36.5 C) 97.7 F (36.5 C)  TempSrc: Oral Oral Oral Oral  Resp: '16 20 20 16  ' Height:      Weight:      SpO2: 97% 94% 94% 98%    General: Pt is alert, follows commands appropriately, not in acute distress Cardiovascular: Regular rate and rhythm, S1/S2 +, no murmurs Respiratory: Clear to auscultation bilaterally, no wheezing, no crackles, no rhonchi Abdominal: Soft, non tender, non distended, bowel sounds +, no guarding Extremities: no edema, no cyanosis, pulses palpable bilaterally DP and PT Neuro: Grossly nonfocal  Discharge Instructions  Discharge Instructions    Call MD for:  persistant dizziness or light-headedness    Complete by:  As directed      Call MD for:  redness, tenderness, or signs of infection (pain, swelling, redness, odor or green/yellow discharge around incision site)    Complete by:  As directed      Call MD for:  severe uncontrolled pain    Complete by:  As directed      Diet - low sodium heart healthy    Complete by:  As directed      Increase activity slowly    Complete by:  As directed             Medication List    STOP taking these medications        carbamide peroxide 6.5 % otic solution  Commonly known as:  DEBROX     isosorbide-hydrALAZINE 20-37.5 MG per tablet  Commonly known as:  BIDIL     loratadine-pseudoephedrine 5-120 MG per tablet  Commonly known as:  CLARITIN-D 12 HOUR      TAKE these medications        acetaminophen 500 MG tablet  Commonly known as:  TYLENOL  Take 500-1,000 mg by mouth every 6 (six) hours as needed for headache.     amLODipine 5 MG tablet  Commonly known as:  NORVASC  Take 1 tablet (5 mg total) by mouth daily.     carvedilol 25 MG tablet  Commonly known as:  COREG  Take 2 tablets (50 mg total) by mouth 2 (two) times daily.     insulin NPH-regular Human (70-30) 100 UNIT/ML injection  Commonly known as:  NOVOLIN 70/30  Inject 30 Units into the skin 2 (two) times daily with a meal.      levothyroxine 137 MCG tablet  Commonly known as:  SYNTHROID, LEVOTHROID  Take 1 tablet (137 mcg total) by mouth daily. BRAND NAME ONLY     mycophenolate 250 MG capsule  Commonly known as:  CELLCEPT  Take 500 mg by mouth 2 (two) times daily.     predniSONE 5 MG tablet  Commonly known as:  DELTASONE  Take 5 mg by mouth daily.     ranitidine 150 MG capsule  Commonly known as:  ZANTAC  Take 150 mg by mouth daily.     tacrolimus 1 MG capsule  Commonly known as:  PROGRAF  Take 1 mg by mouth 2 (two) times daily.           Follow-up Information  Follow up with [facility]    . Schedule an appointment as soon as possible for a visit in 1 week.   Why:  Follow up appt after recent hospitalization   Contact information:   [address] [place] [address] [phone]       The results of significant diagnostics from this hospitalization (including imaging, microbiology, ancillary and laboratory) are listed below for reference.    Significant Diagnostic Studies: No results found.  Microbiology: Recent Results (from the past 240 hour(s))  MRSA PCR Screening     Status: Abnormal   Collection Time: 03/02/15  1:43 PM  Result Value Ref Range Status   MRSA by PCR POSITIVE (A) NEGATIVE Final    Comment:        The GeneXpert MRSA Assay (FDA approved for NASAL specimens only), is one component of a comprehensive MRSA colonization surveillance program. It is not intended to diagnose MRSA infection nor to guide or monitor treatment for MRSA infections. RESULT CALLED TO, READ BACK BY AND VERIFIED WITH: T.ARNOLD AT 1725 ON 03/02/15 BY S.VANHOORNE      Labs: Basic Metabolic Panel:  Recent Labs Lab 03/02/15 1156 03/02/15 1720 03/02/15 2103 03/03/15 0150 03/03/15 0530  NA 130* 135 137 139 136  K 5.2* 4.1 4.1 4.0 4.3  CL 97* 104 107 107 107  CO2 8* 9* 18* 20* 18*  GLUCOSE 696* 431* 209* 93 158*  BUN 39* 39* 39* 36*  36*  CREATININE 1.80* 1.92* 1.65* 1.34* 1.33*  CALCIUM 10.0 9.8 9.7 9.9 9.6   Liver Function Tests:  Recent Labs Lab 03/02/15 1156  AST 13*  ALT 15  ALKPHOS 118  BILITOT 2.1*  PROT 6.6  ALBUMIN 3.4*   No results for input(s): LIPASE, AMYLASE in the last 168 hours. No results for input(s): AMMONIA in the last 168 hours. CBC:  Recent Labs Lab 03/02/15 1156  WBC 6.5  NEUTROABS 4.9  HGB 13.7  HCT 42.2  MCV 91.9  PLT 170   Cardiac Enzymes: No results for input(s): CKTOTAL, CKMB, CKMBINDEX, TROPONINI in the last 168 hours. BNP: BNP (last 3 results) No results for input(s): BNP in the last 8760 hours.  ProBNP (last 3 results) No results for input(s): PROBNP in the last 8760 hours.  CBG:  Recent Labs Lab 03/03/15 0736 03/03/15 1147 03/03/15 1635 03/03/15 2114 03/04/15 0720  GLUCAP 130* 176* 263* 234* 255*

## 2015-03-08 ENCOUNTER — Emergency Department (HOSPITAL_COMMUNITY)
Admission: EM | Admit: 2015-03-08 | Discharge: 2015-03-08 | Disposition: A | Discharge status: Home / Self Care | Payer: Medicare Other | Attending: Emergency Medicine | Admitting: Emergency Medicine

## 2015-03-08 ENCOUNTER — Emergency Department (HOSPITAL_COMMUNITY): Payer: Medicare Other

## 2015-03-08 ENCOUNTER — Encounter (HOSPITAL_COMMUNITY): Payer: Self-pay | Admitting: Neurology

## 2015-03-08 DIAGNOSIS — E10621 Type 1 diabetes mellitus with foot ulcer: Secondary | ICD-10-CM | POA: Diagnosis not present

## 2015-03-08 DIAGNOSIS — M79671 Pain in right foot: Secondary | ICD-10-CM | POA: Diagnosis present

## 2015-03-08 DIAGNOSIS — Z79899 Other long term (current) drug therapy: Secondary | ICD-10-CM | POA: Insufficient documentation

## 2015-03-08 DIAGNOSIS — M549 Dorsalgia, unspecified: Secondary | ICD-10-CM | POA: Diagnosis not present

## 2015-03-08 DIAGNOSIS — Z8639 Personal history of other endocrine, nutritional and metabolic disease: Secondary | ICD-10-CM

## 2015-03-08 DIAGNOSIS — Z862 Personal history of diseases of the blood and blood-forming organs and certain disorders involving the immune mechanism: Secondary | ICD-10-CM | POA: Diagnosis not present

## 2015-03-08 DIAGNOSIS — I120 Hypertensive chronic kidney disease with stage 5 chronic kidney disease or end stage renal disease: Secondary | ICD-10-CM | POA: Diagnosis not present

## 2015-03-08 DIAGNOSIS — E039 Hypothyroidism, unspecified: Secondary | ICD-10-CM | POA: Diagnosis not present

## 2015-03-08 DIAGNOSIS — Z87891 Personal history of nicotine dependence: Secondary | ICD-10-CM | POA: Insufficient documentation

## 2015-03-08 DIAGNOSIS — Z794 Long term (current) use of insulin: Secondary | ICD-10-CM | POA: Insufficient documentation

## 2015-03-08 DIAGNOSIS — N186 End stage renal disease: Secondary | ICD-10-CM | POA: Insufficient documentation

## 2015-03-08 DIAGNOSIS — L97519 Non-pressure chronic ulcer of other part of right foot with unspecified severity: Secondary | ICD-10-CM | POA: Diagnosis not present

## 2015-03-08 DIAGNOSIS — G8929 Other chronic pain: Secondary | ICD-10-CM | POA: Diagnosis not present

## 2015-03-08 DIAGNOSIS — F329 Major depressive disorder, single episode, unspecified: Secondary | ICD-10-CM | POA: Insufficient documentation

## 2015-03-08 LAB — CBC WITH DIFFERENTIAL/PLATELET
BASOS ABS: 0 10*3/uL (ref 0.0–0.1)
BASOS PCT: 0 % (ref 0–1)
Eosinophils Absolute: 0.1 10*3/uL (ref 0.0–0.7)
Eosinophils Relative: 2 % (ref 0–5)
HEMATOCRIT: 42.8 % (ref 36.0–46.0)
Hemoglobin: 14 g/dL (ref 12.0–15.0)
LYMPHS ABS: 0.9 10*3/uL (ref 0.7–4.0)
Lymphocytes Relative: 17 % (ref 12–46)
MCH: 29 pg (ref 26.0–34.0)
MCHC: 32.7 g/dL (ref 30.0–36.0)
MCV: 88.6 fL (ref 78.0–100.0)
MONO ABS: 0.6 10*3/uL (ref 0.1–1.0)
Monocytes Relative: 11 % (ref 3–12)
Neutro Abs: 3.8 10*3/uL (ref 1.7–7.7)
Neutrophils Relative %: 70 % (ref 43–77)
PLATELETS: 156 10*3/uL (ref 150–400)
RBC: 4.83 MIL/uL (ref 3.87–5.11)
RDW: 13 % (ref 11.5–15.5)
WBC: 5.5 10*3/uL (ref 4.0–10.5)

## 2015-03-08 LAB — BASIC METABOLIC PANEL
ANION GAP: 7 (ref 5–15)
BUN: 23 mg/dL — AB (ref 6–20)
CALCIUM: 10.1 mg/dL (ref 8.9–10.3)
CO2: 27 mmol/L (ref 22–32)
Chloride: 106 mmol/L (ref 101–111)
Creatinine, Ser: 0.99 mg/dL (ref 0.44–1.00)
GFR calc Af Amer: >60 mL/min (ref >60)
GFR calc non Af Amer: >60 mL/min (ref >60)
Glucose, Bld: 107 mg/dL — ABNORMAL HIGH (ref 65–99)
POTASSIUM: 4.7 mmol/L (ref 3.5–5.1)
SODIUM: 140 mmol/L (ref 135–145)

## 2015-03-08 LAB — C-REACTIVE PROTEIN: CRP: 0.6 mg/dL (ref <1.0)

## 2015-03-08 LAB — I-STAT CG4 LACTIC ACID, ED: LACTIC ACID, VENOUS: 1.6 mmol/L (ref 0.5–2.0)

## 2015-03-08 LAB — SEDIMENTATION RATE: Sed Rate: 10 mm/hr (ref 0–22)

## 2015-03-08 MED ORDER — MORPHINE SULFATE 4 MG/ML IJ SOLN
4.0000 mg | Freq: Once | INTRAMUSCULAR | Status: AC
  Administered 2015-03-08: 4 mg via INTRAVENOUS
  Filled 2015-03-08: qty 1

## 2015-03-08 MED ORDER — HYDROCODONE-ACETAMINOPHEN 5-325 MG PO TABS: 1.0000 | ORAL_TABLET | Freq: Four times a day (QID) | ORAL | Status: DC | PRN

## 2015-03-08 MED ORDER — DOXYCYCLINE HYCLATE 100 MG PO CAPS: 100.0000 mg | ORAL_CAPSULE | Freq: Two times a day (BID) | ORAL | Status: DC

## 2015-03-08 NOTE — ED Notes (Signed)
Patient is alert and orientedx4.  Patient was explained discharge instructions and they understood them with no questions.  The patient's husband,  Is taking her home.

## 2015-03-08 NOTE — ED Notes (Signed)
Family at bedside. 

## 2015-03-08 NOTE — ED Notes (Addendum)
Pt reports blister to right foot between 4th and 5th toe. Is swollen and purple. Is diabetic and has had several toe amputations. Pt ambulatory.also c/o lower back pain which got worse when she bent over, had back surgery 10 years ago.

## 2015-03-08 NOTE — ED Notes (Signed)
The patient was given a post op shoe.  I showed her how to put the shoe on.

## 2015-03-08 NOTE — Discharge Instructions (Signed)
Keep wound clean and dry, use crutches for all weight bearing and do not put pressure down on your foot. DO NOT POP THE BLISTER. If it pops on its own, apply triple antibiotic ointment with a bandage. Take antibiotic until it is finished and avoid sunlight exposure when taking this medication. Take tylenol or norco as directed, as needed for pain but do not drive or operate machinery with pain medication use. Call Dr. Eliberto Ivory office today and schedule an appointment for Tuesday 03/12/15. Monitor area for signs of infection to include, but not limited to: increasing pain, redness, drainage/pus, or worsening swelling. Return to emergency department for emergent changing or worsening symptoms.    Pressure Ulcer A pressure ulcer is a sore that has formed from the breakdown of skin and exposure of deeper layers of tissue. It develops in areas of the body where there is unrelieved pressure. Pressure ulcers are usually found over a bony area, such as the shoulder blades, spine, lower back, hips, knees, ankles, and heels. Pressure ulcers vary in severity. Your health care provider may determine the severity (stage) of your pressure ulcer. The stages include:  Stage I--The skin is red, and when the skin is pressed, it stays red.  Stage II--The top layer of skin is gone, and there is a shallow, pink ulcer.  Stage III--The ulcer becomes deeper, and it is more difficult to see the whole wound. Also, there may be yellow or brown parts, as well as pink and red parts.  Stage IV--The ulcer may be deep and red, pink, brown, white, or yellow. Bone or muscle may be seen.  Unstageable pressure ulcer--The ulcer is covered almost completely with black, brown, or yellow tissue. It is not known how deep the ulcer is or what stage it is until this covering comes off.  Suspected deep tissue injury--A person's skin can be injured from pressure or pulling on the skin when his or her position is changed. The skin appears  purple or maroon. There may not be an opening in the skin, but there could be a blood-filled blister. This deep tissue injury is often difficult to see in people with darker skin tones. The site may open and become deeper in time. However, early interventions will help the area heal and may prevent the area from opening. CAUSES  Pressure ulcers are caused by pressure against the skin that limits the flow of blood to the skin and nearby tissues. There are many risk factors that can lead to pressure sores. RISK FACTORS  Decreased ability to move.  Decreased ability to feel pain or discomfort.  Excessive skin moisture from urine, stool, sweat, or secretions.  Poor nutrition.  Dehydration.  Tobacco, drug, or alcohol abuse.  Having someone pull on bedsheets that are under you, such as when health care workers are changing your position in a hospital bed.  Obesity.  Increased adult age.  Hospitalization in a critical care unit for longer than 4 days with use of medical devices.  Prolonged use of medical devices.  Critical illness.  Anemia.  Traumatic brain injury.  Spinal cord injury.  Stroke.  Diabetes.  Poor blood glucose control.  Low blood pressure (hypotension).  Low oxygen levels.  Medicines that reduce blood flow.  Infection. DIAGNOSIS  Your health care provider will diagnose your pressure ulcer based on its appearance. The health care provider may determine the stage of your pressure ulcer as well. Tests may be done to check for infection, to assess your circulation,  or to check for other diseases, such as diabetes. TREATMENT  Treatment of your pressure ulcer begins with determining what stage the ulcer is in. Your treatment team may include your health care provider, a wound care specialist, a nutritionist, a physical therapist, and a Careers advisersurgeon. Possible treatments may include:   Moving or repositioning every 1-2 hours.  Using beds or mattresses to shift your  body weight and pressure points frequently.  Improving your diet.  Cleaning and bandaging (dressing) the open wound.  Giving antibiotic medicines.  Removing damaged tissue.  Surgery and sometimes skin grafts. HOME CARE INSTRUCTIONS  If you were hospitalized, follow the care plan that was started in the hospital.  Avoid staying in the same position for more than 2 hours. Use padding, devices, or mattresses to cushion your pressure points as directed by your health care provider.  Eat a well-balanced diet. Take nutritional supplements and vitamins as directed by your health care provider.  Keep all follow-up appointments.  Only take over-the-counter or prescription medicines for pain, fever, or discomfort as directed by your health care provider. SEEK MEDICAL CARE IF:   Your pressure ulcer is not improving.  You do not know how to care for your pressure ulcer.  You notice other areas of redness on your skin.  You have a fever. SEEK IMMEDIATE MEDICAL CARE IF:   You have increasing redness, swelling, or pain in your pressure ulcer.  You notice pus coming from your pressure ulcer.  You notice a bad smell coming from the wound or dressing.  Your pressure ulcer opens up again. Document Released: 08/24/2005 Document Revised: 08/29/2013 Document Reviewed: 05/01/2013 [facility] Patient Information 2015 [place], [place]. This information is not intended to replace advice given to you by your health care provider. Make sure you discuss any questions you have with your health care provider.  Musculoskeletal Pain Musculoskeletal pain is muscle and boney aches and pains. These pains can occur in any part of the body. Your caregiver may treat you without knowing the cause of the pain. They may treat you if blood or urine tests, X-rays, and other tests were normal.  CAUSES There is often not a definite cause or reason for these pains. These pains may be caused by a type of germ (virus). The  discomfort may also come from overuse. Overuse includes working out too hard when your body is not fit. Boney aches also come from weather changes. Bone is sensitive to atmospheric pressure changes. HOME CARE INSTRUCTIONS   Ask when your test results will be ready. Make sure you get your test results.  Only take over-the-counter or prescription medicines for pain, discomfort, or fever as directed by your caregiver. If you were given medications for your condition, do not drive, operate machinery or power tools, or sign legal documents for 24 hours. Do not drink alcohol. Do not take sleeping pills or other medications that may interfere with treatment.  Continue all activities unless the activities cause more pain. When the pain lessens, slowly resume normal activities. Gradually increase the intensity and duration of the activities or exercise.  During periods of severe pain, bed rest may be helpful. Lay or sit in any position that is comfortable.  Putting ice on the injured area.  Put ice in a bag.  Place a towel between your skin and the bag.  Leave the ice on for 15 to 20 minutes, 3 to 4 times a day.  Follow up with your caregiver for continued problems and no  reason can be found for the pain. If the pain becomes worse or does not go away, it may be necessary to repeat tests or do additional testing. Your caregiver may need to look further for a possible cause. SEEK IMMEDIATE MEDICAL CARE IF:  You have pain that is getting worse and is not relieved by medications.  You develop chest pain that is associated with shortness or breath, sweating, feeling sick to your stomach (nauseous), or throw up (vomit).  Your pain becomes localized to the abdomen.  You develop any new symptoms that seem different or that concern you. MAKE SURE YOU:   Understand these instructions.  Will watch your condition.  Will get help right away if you are not doing well or get worse. Document Released:  08/24/2005 Document Revised: 11/16/2011 Document Reviewed: 04/28/2013 [facility] Patient Information 2015 [place], [place]. This information is not intended to replace advice given to you by your health care provider. Make sure you discuss any questions you have with your health care provider.

## 2015-03-08 NOTE — ED Notes (Signed)
Pt reports R foot pain and diabetic wound. Pt reports issues with diabetic wounds for several years, multiple toe amputations, black open wound noted to R foot, 8/10 pain, pt reports foul odor from site.

## 2015-03-08 NOTE — ED Provider Notes (Signed)
CSN: [id number]     Arrival date & time 03/08/15  1158 History   First MD Initiated Contact with Patient 03/08/15 1213     Chief Complaint  Patient presents with  . Foot Pain     (Consider location/radiation/quality/duration/timing/severity/associated sxs/prior Treatment) HPI Comments: [name] is a 49 y.o. female with a PMHx of IDDM, HTN, hypothyroidism, kidney transplant on antirejection therapies, and depression, who presents to the ED with complaints of right foot pain with a blister noted between the fourth and fifth digits. Chest and complains of low back pain after bending over on Wednesday. She reports that as Wednesday her right foot developed 4/10 throbbing intermittent nonradiating pain worse with ambulation and unrelieved by Tylenol. She states the blister was noted originally on Saturday during her admission, but it is increased in size and become blacker. She denies any drainage or warmth, no erythema, but she states that this is what it appeared like in the past when she's had prior amputations of the digits. Her orthopedist is Dr. Zollie Beckers who performed these prior amputations. She states that since her discharge her CBGs have been in the 200s. She denies any fevers, chills, chest pain, shortness breath, abdominal pain, nausea, vomiting, diarrhea, constipation, dysuria, hematuria, melena, hematochezia, cauda equina symptoms, numbness, tingling, weakness, or trauma to the foot.  PCP: [facility]. Nephrologist: Donato Heinz  Patient is a 49 y.o. female presenting with lower extremity pain. The history is provided by the patient. No language interpreter was used.  Foot Pain This is a new problem. The current episode started in the past 7 days. The problem occurs constantly. The problem has been unchanged. Associated symptoms include arthralgias (R foot). Pertinent negatives include no abdominal pain, chest pain, chills, fever, myalgias, nausea,  numbness, vomiting or weakness. The symptoms are aggravated by walking. She has tried acetaminophen for the symptoms. The treatment provided no relief.    Past Medical History  Diagnosis Date  . Hypertension   . Hypothyroid   . Depression   . 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 Kidney disease   . End stage renal disease     right arm AV graft, post transplant  . Diabetes mellitus     Insulin dependant   Past Surgical History  Procedure Laterality Date  . Back surgery  2001  . Tubal ligation    . Dg av dialysis graft declot or    . Nephrectomy transplanted organ    . Kidney transplant  December 16, 2009  . 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  . Cataract extraction w/ intraocular lens implant Right   . Amputation Left 05/11/2014    Procedure: AMPUTATION LEFT GREAT TOE;  Surgeon: Mcarthur Rossetti, MD;  Location: [facility];  Service: Orthopedics;  Laterality: Left;   Family History  Problem Relation Age of Onset  . Emphysema Mother   . Throat cancer Mother   . COPD Mother   . Cancer Mother   . Emphysema Father   . COPD Father   . Stroke Father   . ADD / ADHD Son    History  Substance Use Topics  . Smoking status: Former Smoker -- 1.00 packs/day for 11 years    Types: Cigarettes    Quit date: 09/21/1998  . Smokeless tobacco: Never Used  . Alcohol Use: Yes     Comment: occasional   OB History    No data available     Review  of Systems  Constitutional: Negative for fever and chills.  Respiratory: Negative for shortness of breath.   Cardiovascular: Negative for chest pain and leg swelling.  Gastrointestinal: Negative for nausea, vomiting, abdominal pain, diarrhea and constipation.  Genitourinary: Negative for dysuria, hematuria, flank pain, vaginal bleeding and vaginal discharge.  Musculoskeletal: Positive for back pain and arthralgias (R foot). Negative for myalgias.  Skin: Positive for color change (blackened blister R foot).    Allergic/Immunologic: Positive for immunocompromised state (diabetic and s/p renal transplant).  Neurological: Negative for weakness and numbness.  Psychiatric/Behavioral: Negative for confusion.   10 Systems reviewed and are negative for acute change except as noted in the HPI.    Allergies  Ace inhibitors and Adhesive  Home Medications   Prior to Admission medications   Medication Sig Start Date End Date Taking? Authorizing Provider  acetaminophen (TYLENOL) 500 MG tablet Take 500-1,000 mg by mouth every 6 (six) hours as needed for headache.    Historical Provider, MD  amLODipine (NORVASC) 5 MG tablet Take 1 tablet (5 mg total) by mouth daily. 03/04/15   Robbie Lis, MD  carvedilol (COREG) 25 MG tablet Take 2 tablets (50 mg total) by mouth 2 (two) times daily. 03/04/15   Robbie Lis, MD  insulin NPH-regular Human (NOVOLIN 70/30) (70-30) 100 UNIT/ML injection Inject 30 Units into the skin 2 (two) times daily with a meal. 03/04/15   Robbie Lis, MD  levothyroxine (SYNTHROID, LEVOTHROID) 137 MCG tablet Take 1 tablet (137 mcg total) by mouth daily. BRAND NAME ONLY 03/04/15   Robbie Lis, MD  mycophenolate (CELLCEPT) 250 MG capsule Take 500 mg by mouth 2 (two) times daily.     Historical Provider, MD  predniSONE (DELTASONE) 5 MG tablet Take 5 mg by mouth daily.      Historical Provider, MD  ranitidine (ZANTAC) 150 MG capsule Take 150 mg by mouth daily.     Historical Provider, MD  tacrolimus (PROGRAF) 1 MG capsule Take 1 mg by mouth 2 (two) times daily.     Historical Provider, MD   BP 150/75 mmHg  Pulse 74  Temp(Src) 97.4 F (36.3 C) (Oral)  Resp 16  SpO2 99%  LMP 11/19/2011 Physical Exam  Constitutional: She is oriented to person, place, and time. Vital signs are normal. She appears well-developed and well-nourished.  Non-toxic appearance. No distress.  Afebrile, nontoxic, NAD  HENT:  Head: Normocephalic and atraumatic.  Mouth/Throat: Oropharynx is clear and moist and mucous  membranes are normal.  Eyes: Conjunctivae and EOM are normal. Right eye exhibits no discharge. Left eye exhibits no discharge.  Neck: Normal range of motion. Neck supple.  Cardiovascular: Normal rate, regular rhythm, normal heart sounds and intact distal pulses.  Exam reveals no gallop and no friction rub.   No murmur heard. Pulses:      Dorsalis pedis pulses are 1+ on the right side, and 2+ on the left side.  R DP pulse mildly diminished to palpation but doppler tones strong  Pulmonary/Chest: Effort normal and breath sounds normal. No respiratory distress. She has no decreased breath sounds. She has no wheezes. She has no rhonchi. She has no rales.  Abdominal: Soft. Normal appearance and bowel sounds are normal. She exhibits no distension. There is no tenderness. There is no rigidity, no rebound, no guarding, no CVA tenderness, no tenderness at McBurney's point and negative Murphy's sign.  Musculoskeletal: Normal range of motion.       Lumbar back: She exhibits tenderness and spasm.  She exhibits normal range of motion, no bony tenderness and no deformity.       Right foot: There is tenderness. There is normal range of motion, no swelling and normal capillary refill.  Lumbar spine with FROM intact without spinous process TTP, no bony stepoffs or deformities, with mild b/l paraspinous muscle TTP and muscle spasms. Strength 5/5 in all extremities, sensation grossly intact in all extremities, negative SLR bilaterally. No overlying skin changes. Distal pulses intact although R DP pulse mildly diminished to palpation as noted above. Dop tones adequate Brawny discoloration of lower legs without pedal edema.  R foot with 1st-3rd digits amputated, and blackened bulla between 4-5th digits which is mildly tender to palpation without surrounding erythema or warmth. Pressure ulcer to plantar aspect of foot inferior to the 4th toe, dry and crusted with central eschar. SEE PICTURE BELOW  Neurological: She is  alert and oriented to person, place, and time. She has normal strength. No sensory deficit.  Skin: Skin is warm, dry and intact. No rash noted.  R foot bulla and corn/ulceration as noted above and depicted in picture below  Psychiatric: She has a normal mood and affect.  Nursing note and vitals reviewed.     ED Course  Procedures (including critical care time) Labs Review Labs Reviewed  BASIC METABOLIC PANEL - Abnormal; Notable for the following:    Glucose, Bld 107 (*)    BUN 23 (*)    All other components within normal limits  SEDIMENTATION RATE  C-REACTIVE PROTEIN  CBC WITH DIFFERENTIAL/PLATELET  I-STAT CG4 LACTIC ACID, ED    Imaging Review Dg Foot Complete Right  03/08/2015   CLINICAL DATA:  Pressure sore on bottom of great toe stump  EXAM: RIGHT FOOT COMPLETE - 3+ VIEW  COMPARISON:  07/15/2010  FINDINGS: Prior amputation of the first and third toes at the level of the proximal phalanx and second toe at the level of the distal metatarsal. No radiographic changes of osteomyelitis. Old angulated fifth metatarsal fracture with partial nonunion laterally. No acute fracture, subluxation or dislocation. Vascular calcifications noted.  IMPRESSION: No acute bony abnormality. No radiographic changes of osteomyelitis.   Electronically Signed   By: Rolm Baptise M.D.   On: 03/08/2015 13:18     EKG Interpretation None      MDM   Final diagnoses:  Right foot pain  History of diabetes mellitus  Chronic back pain  Type 1 diabetes mellitus with right diabetic foot ulcer    49 y.o. female here with R foot pain and bulla noted between 4th-5th toes, was noted on Saturday while she was inpt for hyperglycemia/DKA, and it's gotten larger since then. Has had amputations of other toes by Dr. Ninfa Linden. Neurovascularly intact although DP pulses slightly diminished to palpation but doppler tones strong. Brawny skin of lower legs. Corn/ulcer noted to plantar aspect of foot. Will eval for possible  osteomyelitis. Will get labs and xray. Of note, pt has acute on chronic back pain after bending over, no midline tenderness or cauda equina symptoms. Doubt need for further work up. Will reassess shortly. Will give pain meds and reassess shortly.   3:16 PM CBC w/diff unremarkable, BMP showing gluc 107 and BUN 23 which is similar to previous lab values, Lactic acid WNL, CRP WNL. ESR still pending. Xray showing no signs of osteomyelitis. Pain improved but now starting to return, will give another round of morphine. Given her immunosuppression, will consult ortho regarding admission for IV abx and possible opening of  wound.   3:46 PM Dr. Erlinda Hong returning page for [facility] ortho. He evaluated the picture of the wound and stated to make her nonweight bearing to take pressure off the blister, and start on PO doxycycline and f/up with Ninfa Linden on 03/12/15. Does not feel she needs IV abx or any surgical intervention at this time. Will send home with pain meds, post op shoe, and crutches. I explained the diagnosis and have given explicit precautions to return to the ER including for any other new or worsening symptoms. The patient understands and accepts the medical plan as it's been dictated and I have answered their questions. Discharge instructions concerning home care and prescriptions have been given. The patient is STABLE and is discharged to home in good condition.  BP 154/78 mmHg  Pulse 79  Temp(Src) 97.4 F (36.3 C) (Oral)  Resp 21  SpO2 94%  LMP 11/19/2011  Meds ordered this encounter  Medications  . morphine 4 MG/ML injection 4 mg    Sig:   . morphine 4 MG/ML injection 4 mg    Sig:   . doxycycline (VIBRAMYCIN) 100 MG capsule    Sig: Take 1 capsule (100 mg total) by mouth 2 (two) times daily. One po bid x 7 days    Dispense:  14 capsule    Refill:  0    Order Specific Question:  Supervising Provider    Answer:  MILLER, BRIAN [[id number]]  . HYDROcodone-acetaminophen (NORCO) 5-325 MG per tablet     Sig: Take 1 tablet by mouth every 6 (six) hours as needed for severe pain.    Dispense:  10 tablet    Refill:  0    Order Specific Question:  Supervising Provider    Answer:  Noemi Chapel [[id number]]     Zeus Marquis Camprubi-Soms, PA-C 03/08/15 1548  Charlesetta Shanks, MD 03/10/15 [phone]

## 2015-04-20 ENCOUNTER — Encounter (HOSPITAL_COMMUNITY): Payer: Self-pay

## 2015-04-20 ENCOUNTER — Emergency Department (HOSPITAL_COMMUNITY)
Admission: EM | Admit: 2015-04-20 | Discharge: 2015-04-21 | Disposition: A | Discharge status: Home / Self Care | Payer: Medicare Other | Attending: Emergency Medicine | Admitting: Emergency Medicine

## 2015-04-20 DIAGNOSIS — Z940 Kidney transplant status: Secondary | ICD-10-CM

## 2015-04-20 DIAGNOSIS — S90425A Blister (nonthermal), left lesser toe(s), initial encounter: Secondary | ICD-10-CM

## 2015-04-20 DIAGNOSIS — Z862 Personal history of diseases of the blood and blood-forming organs and certain disorders involving the immune mechanism: Secondary | ICD-10-CM | POA: Diagnosis not present

## 2015-04-20 DIAGNOSIS — I120 Hypertensive chronic kidney disease with stage 5 chronic kidney disease or end stage renal disease: Secondary | ICD-10-CM | POA: Insufficient documentation

## 2015-04-20 DIAGNOSIS — N186 End stage renal disease: Secondary | ICD-10-CM | POA: Diagnosis not present

## 2015-04-20 DIAGNOSIS — Z87891 Personal history of nicotine dependence: Secondary | ICD-10-CM | POA: Diagnosis not present

## 2015-04-20 DIAGNOSIS — Z8659 Personal history of other mental and behavioral disorders: Secondary | ICD-10-CM | POA: Insufficient documentation

## 2015-04-20 DIAGNOSIS — E119 Type 2 diabetes mellitus without complications: Secondary | ICD-10-CM | POA: Insufficient documentation

## 2015-04-20 DIAGNOSIS — Z79899 Other long term (current) drug therapy: Secondary | ICD-10-CM | POA: Insufficient documentation

## 2015-04-20 DIAGNOSIS — M5441 Lumbago with sciatica, right side: Secondary | ICD-10-CM | POA: Insufficient documentation

## 2015-04-20 DIAGNOSIS — Z794 Long term (current) use of insulin: Secondary | ICD-10-CM | POA: Insufficient documentation

## 2015-04-20 DIAGNOSIS — R011 Cardiac murmur, unspecified: Secondary | ICD-10-CM | POA: Diagnosis not present

## 2015-04-20 DIAGNOSIS — E039 Hypothyroidism, unspecified: Secondary | ICD-10-CM | POA: Diagnosis not present

## 2015-04-20 DIAGNOSIS — R238 Other skin changes: Secondary | ICD-10-CM | POA: Insufficient documentation

## 2015-04-20 DIAGNOSIS — M545 Low back pain: Secondary | ICD-10-CM | POA: Diagnosis present

## 2015-04-20 LAB — CBG MONITORING, ED: GLUCOSE-CAPILLARY: 285 mg/dL — AB (ref 65–99)

## 2015-04-20 NOTE — ED Notes (Signed)
Onset 3 weeks hole in bottom of right foot callous and onset 2 days blister on top of left foot.  Onset 1 month lower back pain, radiating down back of right leg,  bending over, standing and sitting for long time hurts worse.

## 2015-04-20 NOTE — ED Provider Notes (Signed)
CSN: [id number]     Arrival date & time 04/20/15  1832 History  This chart was scribed for Dione Booze, MD by Leone Payor, ED Scribe. This patient was seen in room A01C/A01C and the patient's care was started 12:01 AM.    Chief Complaint  Patient presents with  . Blister  . Skin Ulcer  . Back Pain   The history is provided by the patient. No language interpreter was used.    HPI Comments: [name] is a 49 y.o. female who presents to the Emergency Department complaining of 3 weeks of a right foot callus and corn and 2 days of a blister to the left 3rd toe. She describes the area on the right foot as throbbing and burning with 8/10 pain. 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 Doneen Poisson. She denies fever, chills, diaphoresis.   She also complains of 1-2 months of constant lower back pain with radiation to the right hip and right leg. She rates her pain as 8/10 currently. She has taken Tylenol without significant relief. She has a history of back surgery ~15-20 years ago. She denies numbness, weakness.   Past Medical History  Diagnosis Date  . Hypertension   . Hypothyroid   . Depression   . 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 Kidney disease   . End stage renal disease     right arm AV graft, post transplant  . Diabetes mellitus     Insulin dependant   Past Surgical History  Procedure Laterality Date  . Back surgery  2001  . Tubal ligation    . Dg av dialysis graft declot or    . Nephrectomy transplanted organ    . Kidney transplant  December 16, 2009  . 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  . Cataract extraction w/ intraocular lens implant Right   . Amputation Left 05/11/2014    Procedure: AMPUTATION LEFT GREAT TOE;  Surgeon: Kathryne Hitch, MD;  Location: [facility];  Service: Orthopedics;  Laterality: Left;   Family History  Problem Relation Age of Onset  . Emphysema Mother   .  Throat cancer Mother   . COPD Mother   . Cancer Mother   . Emphysema Father   . COPD Father   . Stroke Father   . ADD / ADHD Son    Social History  Substance Use Topics  . Smoking status: Former Smoker -- 1.00 packs/day for 11 years    Types: Cigarettes    Quit date: 09/21/1998  . Smokeless tobacco: Never Used  . Alcohol Use: No   OB History    No data available     Review of Systems  Constitutional: Negative for fever, chills and diaphoresis.  Musculoskeletal: Positive for back pain and arthralgias (right and left toes). Negative for neck pain.  Neurological: Negative for weakness and numbness.  All other systems reviewed and are negative.     Allergies  Ace inhibitors and Adhesive  Home Medications   Prior to Admission medications   Medication Sig Start Date End Date Taking? Authorizing Provider  acetaminophen (TYLENOL) 500 MG tablet Take 500-1,000 mg by mouth every 6 (six) hours as needed for headache.    Historical Provider, MD  amLODipine (NORVASC) 5 MG tablet Take 1 tablet (5 mg total) by mouth daily. 03/04/15   Alison Murray, MD  carvedilol (COREG)  25 MG tablet Take 2 tablets (50 mg total) by mouth 2 (two) times daily. 03/04/15   Alison Murray, MD  doxycycline (VIBRAMYCIN) 100 MG capsule Take 1 capsule (100 mg total) by mouth 2 (two) times daily. One po bid x 7 days 03/08/15   Mercedes Camprubi-Soms, PA-C  HYDROcodone-acetaminophen (NORCO) 5-325 MG per tablet Take 1 tablet by mouth every 6 (six) hours as needed for severe pain. 03/08/15   Mercedes Camprubi-Soms, PA-C  insulin NPH-regular Human (NOVOLIN 70/30) (70-30) 100 UNIT/ML injection Inject 30 Units into the skin 2 (two) times daily with a meal. 03/04/15   Alison Murray, MD  levothyroxine (SYNTHROID, LEVOTHROID) 137 MCG tablet Take 1 tablet (137 mcg total) by mouth daily. BRAND NAME ONLY 03/04/15   Alison Murray, MD  mycophenolate (CELLCEPT) 250 MG capsule Take 500 mg by mouth 2 (two) times daily.     Historical  Provider, MD  predniSONE (DELTASONE) 5 MG tablet Take 5 mg by mouth daily.      Historical Provider, MD  ranitidine (ZANTAC) 150 MG capsule Take 150 mg by mouth daily.     Historical Provider, MD  tacrolimus (PROGRAF) 1 MG capsule Take 1 mg by mouth 2 (two) times daily.     Historical Provider, MD   BP 139/66 mmHg  Pulse 72  Temp(Src) 97.8 F (36.6 C) (Oral)  Resp 20  Wt 216 lb (97.977 kg)  SpO2 96%  LMP 11/19/2011 Physical Exam  Constitutional: She is oriented to person, place, and time. She appears well-developed and well-nourished.  HENT:  Head: Normocephalic and atraumatic.  Eyes: EOM are normal. Pupils are equal, round, and reactive to light.  Neck: Normal range of motion. Neck supple. No JVD present.  Cardiovascular: Normal rate and regular rhythm.   Murmur heard. Pulmonary/Chest: Effort normal and breath sounds normal. She has no wheezes. She has no rales. She exhibits no tenderness.  Abdominal: Soft. Bowel sounds are normal. She exhibits no distension and no mass. There is no tenderness.  Musculoskeletal: She exhibits edema and tenderness.  Tender on mid lumbar area with mild bilateral paralumbar spasm. Negative SLR.  Trace pitting edema with moderate venostasis changes, bilaterally Blister present on dorsum of left 3rd toe - 3x14mm. No erythema, swelling, or warmth. S/p amputation of left 1st toe, right 1st, 2nd, and 3rd toes.   Lymphadenopathy:    She has no cervical adenopathy.  Neurological: She is alert and oriented to person, place, and time. No cranial nerve deficit. She exhibits normal muscle tone. Coordination normal.  Decreased sensation in the feet consistent with peripheral neuropathy. Normal sensation beginning in mid calf.    Skin: Skin is warm and dry. No rash noted.  Psychiatric: She has a normal mood and affect. Her behavior is normal. Judgment and thought content normal.  Nursing note and vitals reviewed.   ED Course  Procedures (including critical care  time)  DIAGNOSTIC STUDIES: Oxygen Saturation is 96% on RA, adequate by my interpretation.    COORDINATION OF CARE: 12:13 AM Discussed treatment plan with pt at bedside and pt agreed to plan.   Labs Review Results for orders placed or performed during the hospital encounter of 04/20/15  CBG monitoring, ED  Result Value Ref Range   Glucose-Capillary 285 (H) 65 - 99 mg/dL   I, Dione Booze, MD, personally reviewed and evaluated these images and lab results as part of my medical decision-making.   MDM   Final diagnoses:  Blister of third toe of left  foot, initial encounter  Midline low back pain with right-sided sciatica  H/O kidney transplant    Left foot ulcer which does not appear to be anything more than superficial. However, given her history, she is placed on a can of biotics. She is given a prescription for cephalexin and advised to soak her foot in warm water. She is advised to make sure to check water temperature with her elbow because of presence of diabetic neuropathy. She is to follow-up with her PCP, consider referral to podiatrist regarding her foot callus.  I personally performed the services described in this documentation, which was scribed in my presence. The recorded information has been reviewed and is accurate.     Dione Booze, MD 04/21/15 0730

## 2015-04-21 MED ORDER — CEPHALEXIN 500 MG PO CAPS: 500.0000 mg | ORAL_CAPSULE | Freq: Three times a day (TID) | ORAL | Status: DC

## 2015-04-21 MED ORDER — TRAMADOL HCL 50 MG PO TABS: 50.0000 mg | ORAL_TABLET | Freq: Four times a day (QID) | ORAL | Status: DC | PRN

## 2015-04-21 MED ORDER — ORPHENADRINE CITRATE ER 100 MG PO TB12: 100.0000 mg | ORAL_TABLET | Freq: Two times a day (BID) | ORAL | Status: DC

## 2015-04-21 MED ORDER — CEPHALEXIN 250 MG PO CAPS
500.0000 mg | ORAL_CAPSULE | Freq: Once | ORAL | Status: AC
  Administered 2015-04-21: 500 mg via ORAL
  Filled 2015-04-21: qty 2

## 2015-04-21 NOTE — Discharge Instructions (Signed)
Continue to apply Neosporin Ointment to the blister. Soak in warm water three times a day (test the water temperature with your elbow). If the blister seems to be getting worse, return to the ED immediately.  Back Pain, Adult Low back pain is very common. About 1 in 5 people have back pain.The cause of low back pain is rarely dangerous. The pain often gets better over time.About half of people with a sudden onset of back pain feel better in just 2 weeks. About 8 in 10 people feel better by 6 weeks.  CAUSES Some common causes of back pain include:  Strain of the muscles or ligaments supporting the spine.  Wear and tear (degeneration) of the spinal discs.  Arthritis.  Direct injury to the back. DIAGNOSIS Most of the time, the direct cause of low back pain is not known.However, back pain can be treated effectively even when the exact cause of the pain is unknown.Answering your caregiver's questions about your overall health and symptoms is one of the most accurate ways to make sure the cause of your pain is not dangerous. If your caregiver needs more information, he or she may order lab work or imaging tests (X-rays or MRIs).However, even if imaging tests show changes in your back, this usually does not require surgery. HOME CARE INSTRUCTIONS For many people, back pain returns.Since low back pain is rarely dangerous, it is often a condition that people can learn to [facility] their own.   Remain active. It is stressful on the back to sit or stand in one place. Do not sit, drive, or stand in one place for more than 30 minutes at a time. Take short walks on level surfaces as soon as pain allows.Try to increase the length of time you walk each day.  Do not stay in bed.Resting more than 1 or 2 days can delay your recovery.  Do not avoid exercise or work.Your body is made to move.It is not dangerous to be active, even though your back may hurt.Your back will likely heal faster if you return  to being active before your pain is gone.  Pay attention to your body when you bend and lift. Many people have less discomfortwhen lifting if they bend their knees, keep the load close to their bodies,and avoid twisting. Often, the most comfortable positions are those that put less stress on your recovering back.  Find a comfortable position to sleep. Use a firm mattress and lie on your side with your knees slightly bent. If you lie on your back, put a pillow under your knees.  Only take over-the-counter or prescription medicines as directed by your caregiver. Over-the-counter medicines to reduce pain and inflammation are often the most helpful.Your caregiver may prescribe muscle relaxant drugs.These medicines help dull your pain so you can more quickly return to your normal activities and healthy exercise.  Put ice on the injured area.  Put ice in a plastic bag.  Place a towel between your skin and the bag.  Leave the ice on for 15-20 minutes, 03-04 times a day for the first 2 to 3 days. After that, ice and heat may be alternated to reduce pain and spasms.  Ask your caregiver about trying back exercises and gentle massage. This may be of some benefit.  Avoid feeling anxious or stressed.Stress increases muscle tension and can worsen back pain.It is important to recognize when you are anxious or stressed and learn ways to manage it.Exercise is a great option. SEEK MEDICAL CARE  IF:  You have pain that is not relieved with rest or medicine.  You have pain that does not improve in 1 week.  You have new symptoms.  You are generally not feeling well. SEEK IMMEDIATE MEDICAL CARE IF:   You have pain that radiates from your back into your legs.  You develop new bowel or bladder control problems.  You have unusual weakness or numbness in your arms or legs.  You develop nausea or vomiting.  You develop abdominal pain.  You feel faint. Document Released: 08/24/2005 Document  Revised: 02/23/2012 Document Reviewed: 12/26/2013 [facility] Patient Information 2015 [place], [place]. This information is not intended to replace advice given to you by your health care provider. Make sure you discuss any questions you have with your health care provider.  Cephalexin tablets or capsules What is this medicine? CEPHALEXIN (sef a LEX in) is a cephalosporin antibiotic. It is used to treat certain kinds of bacterial infections It will not work for colds, flu, or other viral infections. This medicine may be used for other purposes; ask your health care provider or pharmacist if you have questions. COMMON BRAND NAME(S): Biocef, Keflex, Keftab What should I tell my health care provider before I take this medicine? They need to know if you have any of these conditions: -kidney disease -stomach or intestine problems, especially colitis -an unusual or allergic reaction to cephalexin, other cephalosporins, penicillins, other antibiotics, medicines, foods, dyes or preservatives -pregnant or trying to get pregnant -breast-feeding How should I use this medicine? Take this medicine by mouth with a full glass of water. Follow the directions on the prescription label. This medicine can be taken with or without food. Take your medicine at regular intervals. Do not take your medicine more often than directed. Take all of your medicine as directed even if you think you are better. Do not skip doses or stop your medicine early. Talk to your pediatrician regarding the use of this medicine in children. While this drug may be prescribed for selected conditions, precautions do apply. Overdosage: If you think you have taken too much of this medicine contact a poison control center or emergency room at once. NOTE: This medicine is only for you. Do not share this medicine with others. What if I miss a dose? If you miss a dose, take it as soon as you can. If it is almost time for your next dose, take only  that dose. Do not take double or extra doses. There should be at least 4 to 6 hours between doses. What may interact with this medicine? -probenecid -some other antibiotics This list may not describe all possible interactions. Give your health care provider a list of all the medicines, herbs, non-prescription drugs, or dietary supplements you use. Also tell them if you smoke, drink alcohol, or use illegal drugs. Some items may interact with your medicine. What should I watch for while using this medicine? Tell your doctor or health care professional if your symptoms do not begin to improve in a few days. Do not treat diarrhea with over the counter products. Contact your doctor if you have diarrhea that lasts more than 2 days or if it is severe and watery. If you have diabetes, you may get a false-positive result for sugar in your urine. Check with your doctor or health care professional. What side effects may I notice from receiving this medicine? Side effects that you should report to your doctor or health care professional as soon as possible: -allergic  reactions like skin rash, itching or hives, swelling of the face, lips, or tongue -breathing problems -pain or trouble passing urine -redness, blistering, peeling or loosening of the skin, including inside the mouth -severe or watery diarrhea -unusually weak or tired -yellowing of the eyes, skin Side effects that usually do not require medical attention (report to your doctor or health care professional if they continue or are bothersome): -gas or heartburn -genital or anal irritation -headache -joint or muscle pain -nausea, vomiting This list may not describe all possible side effects. Call your doctor for medical advice about side effects. You may report side effects to FDA at 1-800-FDA-1088. Where should I keep my medicine? Keep out of the reach of children. Store at room temperature between 59 and 86 degrees F (15 and 30 degrees C).  Throw away any unused medicine after the expiration date. NOTE: This sheet is a summary. It may not cover all possible information. If you have questions about this medicine, talk to your doctor, pharmacist, or health care provider.  2015, Elsevier/[employer] Standard. (2007-11-28 17:09:13)  Orphenadrine tablets What is this medicine? ORPHENADRINE (or FEN a dreen) helps to relieve pain and stiffness in muscles and can treat muscle spasms. This medicine may be used for other purposes; ask your health care provider or pharmacist if you have questions. COMMON BRAND NAME(S): Norflex What should I tell my health care provider before I take this medicine? They need to know if you have any of these conditions: -glaucoma -heart disease -kidney disease -myasthenia gravis -peptic ulcer disease -prostate disease -stomach problems -an unusual or allergic reaction to orphenadrine, other medicines, foods, lactose, dyes, or preservatives -pregnant or trying to get pregnant -breast-feeding How should I use this medicine? Take this medicine by mouth with a full glass of water. Follow the directions on the prescription label. Take your medicine at regular intervals. Do not take your medicine more often than directed. Do not take more than you are told to take. Talk to your pediatrician regarding the use of this medicine in children. Special care may be needed. Patients over 76 years old may have a stronger reaction and need a smaller dose. Overdosage: If you think you have taken too much of this medicine contact a poison control center or emergency room at once. NOTE: This medicine is only for you. Do not share this medicine with others. What if I miss a dose? If you miss a dose, take it as soon as you can. If it is almost time for your next dose, take only that dose. Do not take double or extra doses. What may interact with this medicine? -alcohol -antihistamines -barbiturates, like  phenobarbital -benzodiazepines -cyclobenzaprine -medicines for pain -phenothiazines like chlorpromazine, mesoridazine, prochlorperazine, thioridazine This list may not describe all possible interactions. Give your health care provider a list of all the medicines, herbs, non-prescription drugs, or dietary supplements you use. Also tell them if you smoke, drink alcohol, or use illegal drugs. Some items may interact with your medicine. What should I watch for while using this medicine? Your mouth may get dry. Chewing sugarless gum or sucking hard candy, and drinking plenty of water may help. Contact your doctor if the problem does not go away or is severe. This medicine may cause dry eyes and blurred vision. If you wear contact lenses you may feel some discomfort. Lubricating drops may help. See your eye doctor if the problem does not go away or is severe. You may get drowsy or dizzy. Do not  drive, use machinery, or do anything that needs mental alertness until you know how this medicine affects you. Do not stand or sit up quickly, especially if you are an older patient. This reduces the risk of dizzy or fainting spells. Alcohol may interfere with the effect of this medicine. Avoid alcoholic drinks. What side effects may I notice from receiving this medicine? Side effects that you should report to your doctor or health care professional as soon as possible: -allergic reactions like skin rash, itching or hives, swelling of the face, lips, or tongue -changes in vision -difficulty breathing -fast heartbeat or palpitations -hallucinations -light headedness, fainting spells -vomiting Side effects that usually do not require medical attention (report to your doctor or health care professional if they continue or are bothersome): -dizziness -drowsiness -headache -nausea This list may not describe all possible side effects. Call your doctor for medical advice about side effects. You may report side  effects to FDA at 1-800-FDA-1088. Where should I keep my medicine? Keep out of the reach of children. Store at room temperature between 15 and 30 degrees C (59 and 86 degrees F). Protect from light. Keep container tightly closed. Throw away any unused medicine after the expiration date. NOTE: This sheet is a summary. It may not cover all possible information. If you have questions about this medicine, talk to your doctor, pharmacist, or health care provider.  2015, Elsevier/Gold Standard. (2008-03-20 17:19:12)  Tramadol tablets What is this medicine? TRAMADOL (TRA ma dole) is a pain reliever. It is used to treat moderate to severe pain in adults. This medicine may be used for other purposes; ask your health care provider or pharmacist if you have questions. COMMON BRAND NAME(S): Ultram What should I tell my health care provider before I take this medicine? They need to know if you have any of these conditions: -brain tumor -depression -drug abuse or addiction -head injury -if you frequently drink alcohol containing drinks -kidney disease or trouble passing urine -liver disease -lung disease, asthma, or breathing problems -seizures or epilepsy -suicidal thoughts, plans, or attempt; a previous suicide attempt by you or a family member -an unusual or allergic reaction to tramadol, codeine, other medicines, foods, dyes, or preservatives -pregnant or trying to get pregnant -breast-feeding How should I use this medicine? Take this medicine by mouth with a full glass of water. Follow the directions on the prescription label. If the medicine upsets your stomach, take it with food or milk. Do not take more medicine than you are told to take. Talk to your pediatrician regarding the use of this medicine in children. Special care may be needed. Overdosage: If you think you have taken too much of this medicine contact a poison control center or emergency room at once. NOTE: This medicine is only  for you. Do not share this medicine with others. What if I miss a dose? If you miss a dose, take it as soon as you can. If it is almost time for your next dose, take only that dose. Do not take double or extra doses. What may interact with this medicine? Do not take this medicine with any of the following medications: -MAOIs like Carbex, Eldepryl, Marplan, Nardil, and Parnate This medicine may also interact with the following medications: -alcohol or medicines that contain alcohol -antihistamines -benzodiazepines -bupropion -carbamazepine or oxcarbazepine -clozapine -cyclobenzaprine -digoxin -furazolidone -linezolid -medicines for depression, anxiety, or psychotic disturbances -medicines for migraine headache like almotriptan, eletriptan, frovatriptan, naratriptan, rizatriptan, sumatriptan, zolmitriptan -medicines for pain like pentazocine,  buprenorphine, butorphanol, meperidine, nalbuphine, and propoxyphene -medicines for sleep -muscle relaxants -naltrexone -phenobarbital -phenothiazines like perphenazine, thioridazine, chlorpromazine, mesoridazine, fluphenazine, prochlorperazine, promazine, and trifluoperazine -procarbazine -warfarin This list may not describe all possible interactions. Give your health care provider a list of all the medicines, herbs, non-prescription drugs, or dietary supplements you use. Also tell them if you smoke, drink alcohol, or use illegal drugs. Some items may interact with your medicine. What should I watch for while using this medicine? Tell your doctor or health care professional if your pain does not go away, if it gets worse, or if you have new or a different type of pain. You may develop tolerance to the medicine. Tolerance means that you will need a higher dose of the medicine for pain relief. Tolerance is normal and is expected if you take this medicine for a long time. Do not suddenly stop taking your medicine because you may develop a severe  reaction. Your body becomes used to the medicine. This does NOT mean you are addicted. Addiction is a behavior related to getting and using a drug for a non-medical reason. If you have pain, you have a medical reason to take pain medicine. Your doctor will tell you how much medicine to take. If your doctor wants you to stop the medicine, the dose will be slowly lowered over time to avoid any side effects. You may get drowsy or dizzy. Do not drive, use machinery, or do anything that needs mental alertness until you know how this medicine affects you. Do not stand or sit up quickly, especially if you are an older patient. This reduces the risk of dizzy or fainting spells. Alcohol can increase or decrease the effects of this medicine. Avoid alcoholic drinks. You may have constipation. Try to have a bowel movement at least every 2 to 3 days. If you do not have a bowel movement for 3 days, call your doctor or health care professional. Your mouth may get dry. Chewing sugarless gum or sucking hard candy, and drinking plenty of water may help. Contact your doctor if the problem does not go away or is severe. What side effects may I notice from receiving this medicine? Side effects that you should report to your doctor or health care professional as soon as possible: -allergic reactions like skin rash, itching or hives, swelling of the face, lips, or tongue -breathing difficulties, wheezing -confusion -itching -light headedness or fainting spells -redness, blistering, peeling or loosening of the skin, including inside the mouth -seizures Side effects that usually do not require medical attention (report to your doctor or health care professional if they continue or are bothersome): -constipation -dizziness -drowsiness -headache -nausea, vomiting This list may not describe all possible side effects. Call your doctor for medical advice about side effects. You may report side effects to FDA at  1-800-FDA-1088. Where should I keep my medicine? Keep out of the reach of children. Store at room temperature between 15 and 30 degrees C (59 and 86 degrees F). Keep container tightly closed. Throw away any unused medicine after the expiration date. NOTE: This sheet is a summary. It may not cover all possible information. If you have questions about this medicine, talk to your doctor, pharmacist, or health care provider.  2015, Elsevier/Gold Standard. (2010-05-07 11:55:44)

## 2015-05-23 ENCOUNTER — Emergency Department (HOSPITAL_COMMUNITY): Payer: Medicare Other

## 2015-05-23 ENCOUNTER — Emergency Department (HOSPITAL_COMMUNITY)
Admission: EM | Admit: 2015-05-23 | Discharge: 2015-05-23 | Disposition: A | Discharge status: Home / Self Care | Payer: Medicare Other | Attending: Emergency Medicine | Admitting: Emergency Medicine

## 2015-05-23 ENCOUNTER — Encounter (HOSPITAL_COMMUNITY): Payer: Self-pay

## 2015-05-23 DIAGNOSIS — E039 Hypothyroidism, unspecified: Secondary | ICD-10-CM | POA: Insufficient documentation

## 2015-05-23 DIAGNOSIS — D849 Immunodeficiency, unspecified: Secondary | ICD-10-CM | POA: Insufficient documentation

## 2015-05-23 DIAGNOSIS — Z79899 Other long term (current) drug therapy: Secondary | ICD-10-CM | POA: Diagnosis not present

## 2015-05-23 DIAGNOSIS — E119 Type 2 diabetes mellitus without complications: Secondary | ICD-10-CM | POA: Insufficient documentation

## 2015-05-23 DIAGNOSIS — Z794 Long term (current) use of insulin: Secondary | ICD-10-CM | POA: Insufficient documentation

## 2015-05-23 DIAGNOSIS — Z4801 Encounter for change or removal of surgical wound dressing: Secondary | ICD-10-CM | POA: Diagnosis present

## 2015-05-23 DIAGNOSIS — Z87891 Personal history of nicotine dependence: Secondary | ICD-10-CM | POA: Diagnosis not present

## 2015-05-23 DIAGNOSIS — Z23 Encounter for immunization: Secondary | ICD-10-CM | POA: Insufficient documentation

## 2015-05-23 DIAGNOSIS — E11621 Type 2 diabetes mellitus with foot ulcer: Secondary | ICD-10-CM | POA: Insufficient documentation

## 2015-05-23 DIAGNOSIS — N186 End stage renal disease: Secondary | ICD-10-CM | POA: Diagnosis not present

## 2015-05-23 DIAGNOSIS — I120 Hypertensive chronic kidney disease with stage 5 chronic kidney disease or end stage renal disease: Secondary | ICD-10-CM | POA: Insufficient documentation

## 2015-05-23 DIAGNOSIS — F329 Major depressive disorder, single episode, unspecified: Secondary | ICD-10-CM | POA: Insufficient documentation

## 2015-05-23 DIAGNOSIS — L97519 Non-pressure chronic ulcer of other part of right foot with unspecified severity: Secondary | ICD-10-CM | POA: Insufficient documentation

## 2015-05-23 DIAGNOSIS — D899 Disorder involving the immune mechanism, unspecified: Secondary | ICD-10-CM

## 2015-05-23 DIAGNOSIS — L03115 Cellulitis of right lower limb: Secondary | ICD-10-CM

## 2015-05-23 LAB — CBC
HEMATOCRIT: 40.8 % (ref 36.0–46.0)
Hemoglobin: 13 g/dL (ref 12.0–15.0)
MCH: 29.4 pg (ref 26.0–34.0)
MCHC: 31.9 g/dL (ref 30.0–36.0)
MCV: 92.3 fL (ref 78.0–100.0)
Platelets: 193 10*3/uL (ref 150–400)
RBC: 4.42 MIL/uL (ref 3.87–5.11)
RDW: 13.5 % (ref 11.5–15.5)
WBC: 8.6 10*3/uL (ref 4.0–10.5)

## 2015-05-23 LAB — BASIC METABOLIC PANEL
ANION GAP: 9 (ref 5–15)
BUN: 21 mg/dL — ABNORMAL HIGH (ref 6–20)
CALCIUM: 10.5 mg/dL — AB (ref 8.9–10.3)
CO2: 24 mmol/L (ref 22–32)
Chloride: 101 mmol/L (ref 101–111)
Creatinine, Ser: 1.42 mg/dL — ABNORMAL HIGH (ref 0.44–1.00)
GFR calc non Af Amer: 43 mL/min — ABNORMAL LOW (ref >60)
GFR, EST AFRICAN AMERICAN: 50 mL/min — AB (ref >60)
Glucose, Bld: 283 mg/dL — ABNORMAL HIGH (ref 65–99)
Potassium: 4.8 mmol/L (ref 3.5–5.1)
Sodium: 134 mmol/L — ABNORMAL LOW (ref 135–145)

## 2015-05-23 LAB — CBG MONITORING, ED: GLUCOSE-CAPILLARY: 283 mg/dL — AB (ref 65–99)

## 2015-05-23 IMAGING — CR DG CHEST 2V
2 series · 2 of 2 positions shown · non-contrast
Comparison: 03/04/2013

CLINICAL DATA: Diabetes with ketoacidosis.

EXAM:
CHEST  2 VIEW

[w chest pa]
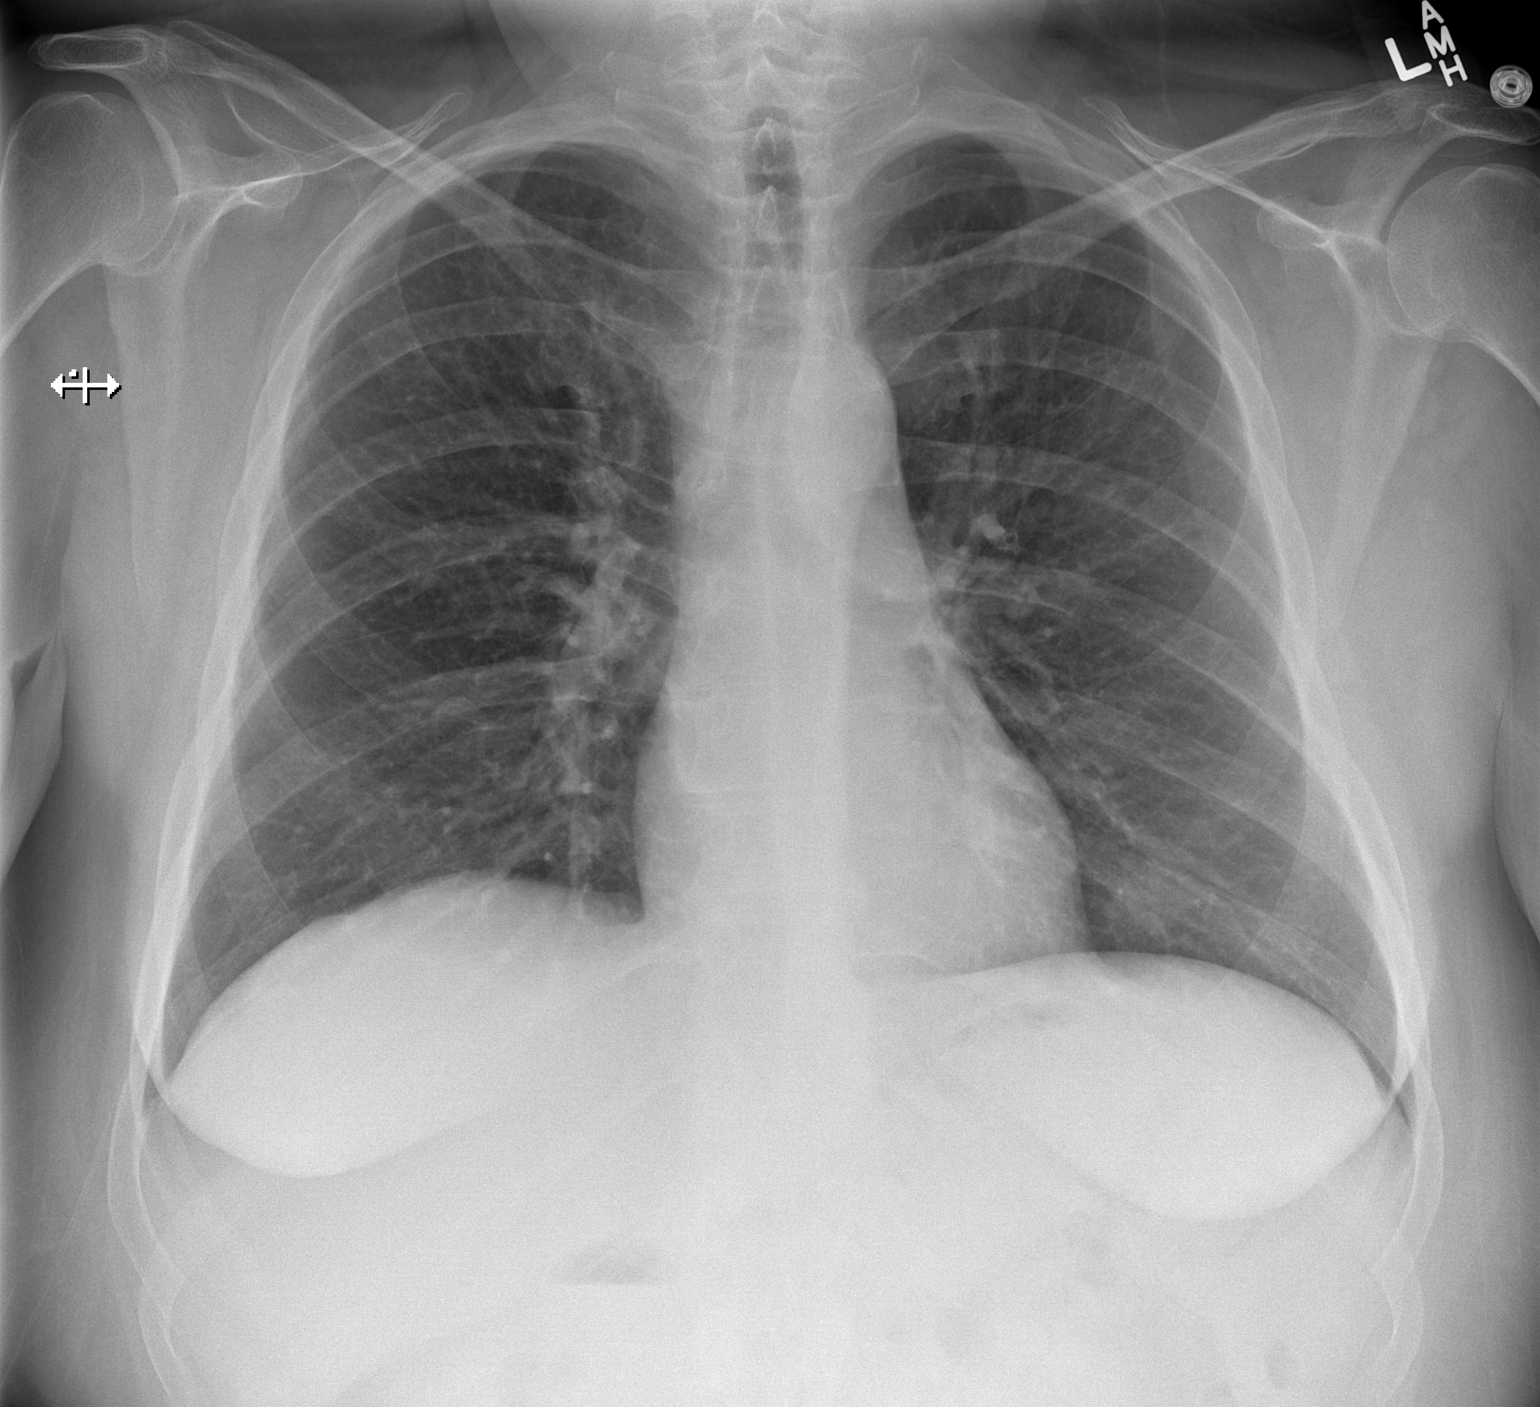

[w chest lat]
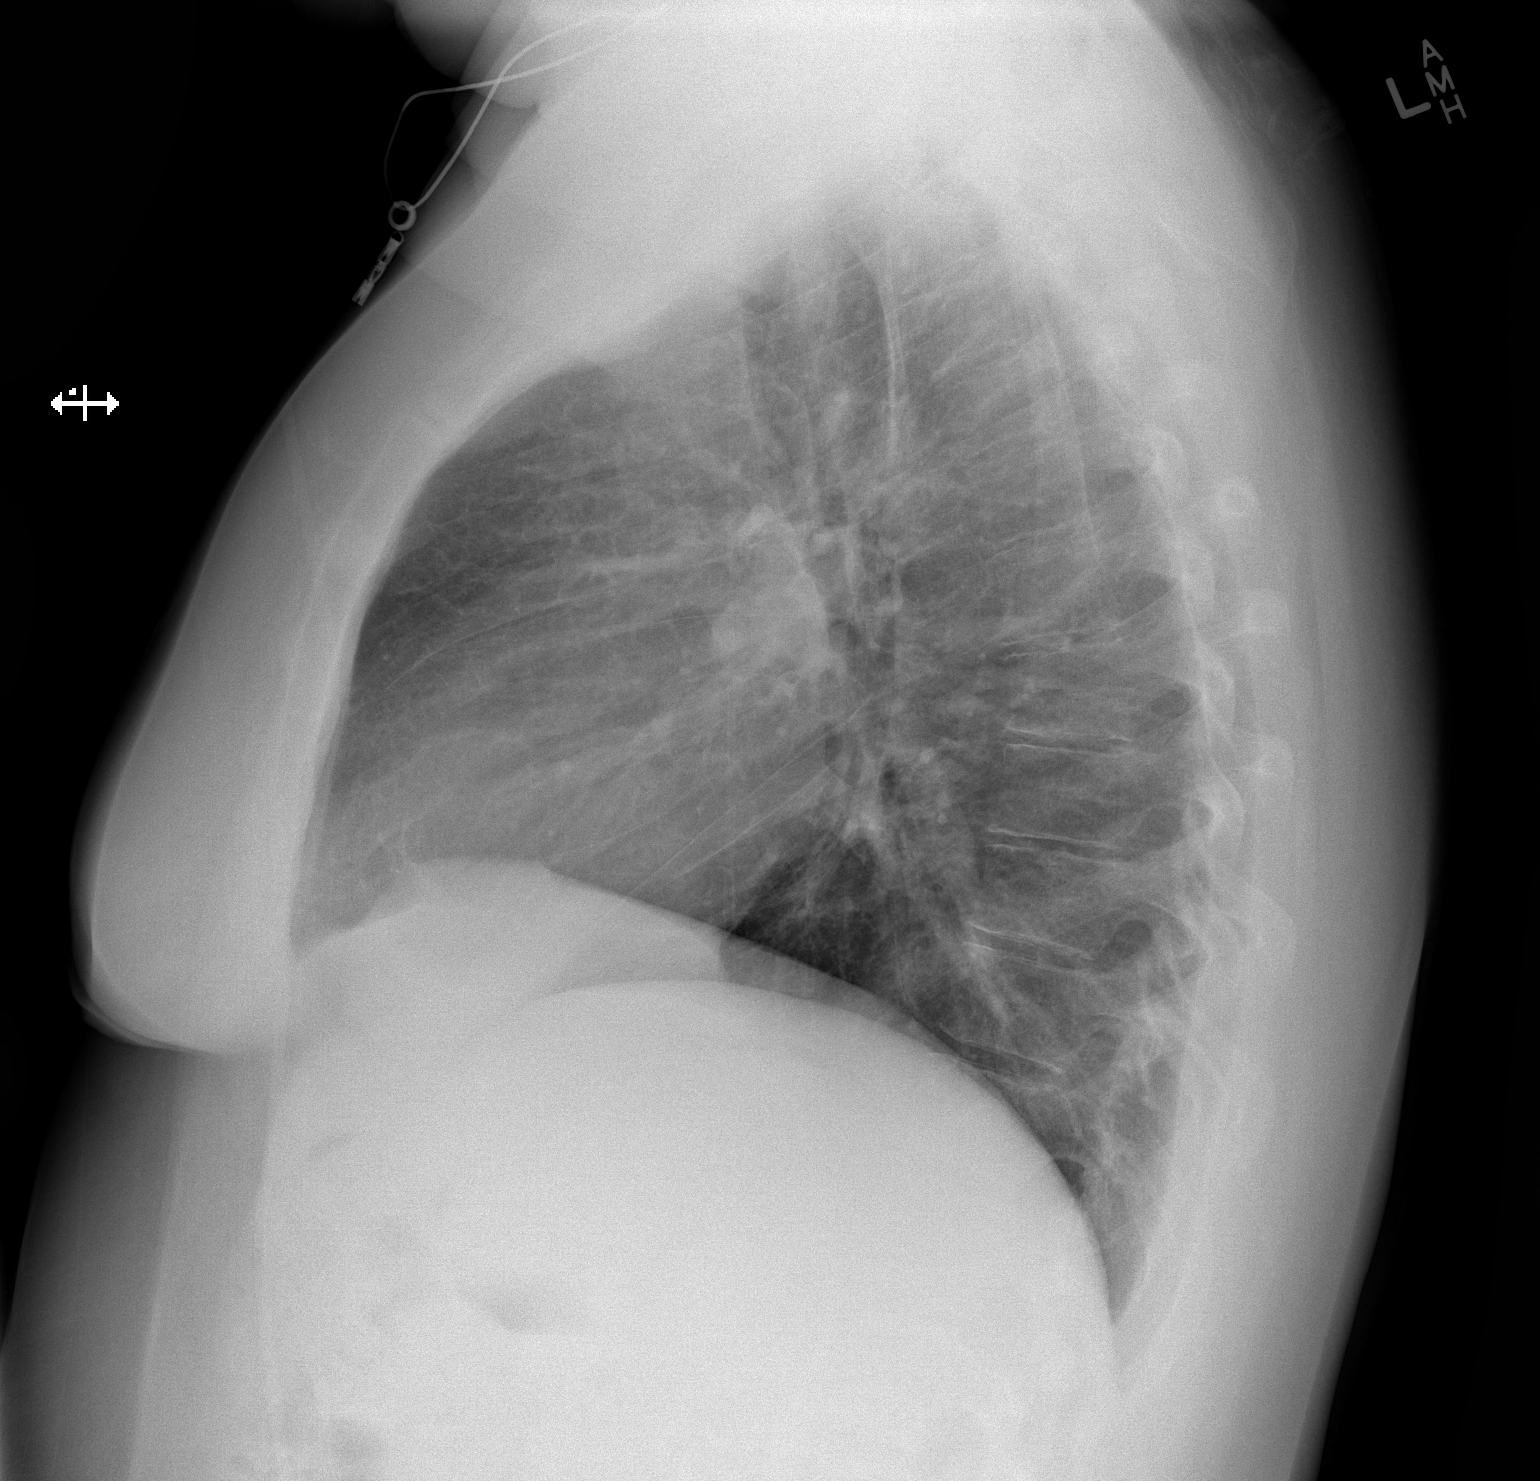

[2 of 2 positions shown; findings below may reference images not displayed]

FINDINGS: The heart size and mediastinal contours are within normal limits.
Both lungs are clear. The visualized skeletal structures are
unremarkable.
IMPRESSION: Normal exam.

## 2015-05-23 IMAGING — CR DG ABDOMEN 2V
2 series · 2 of 2 positions shown · non-contrast
Comparison: None.

CLINICAL DATA: Diabetes with ketoacidosis

EXAM:
ABDOMEN - 2 VIEW

[w abdomen upright]
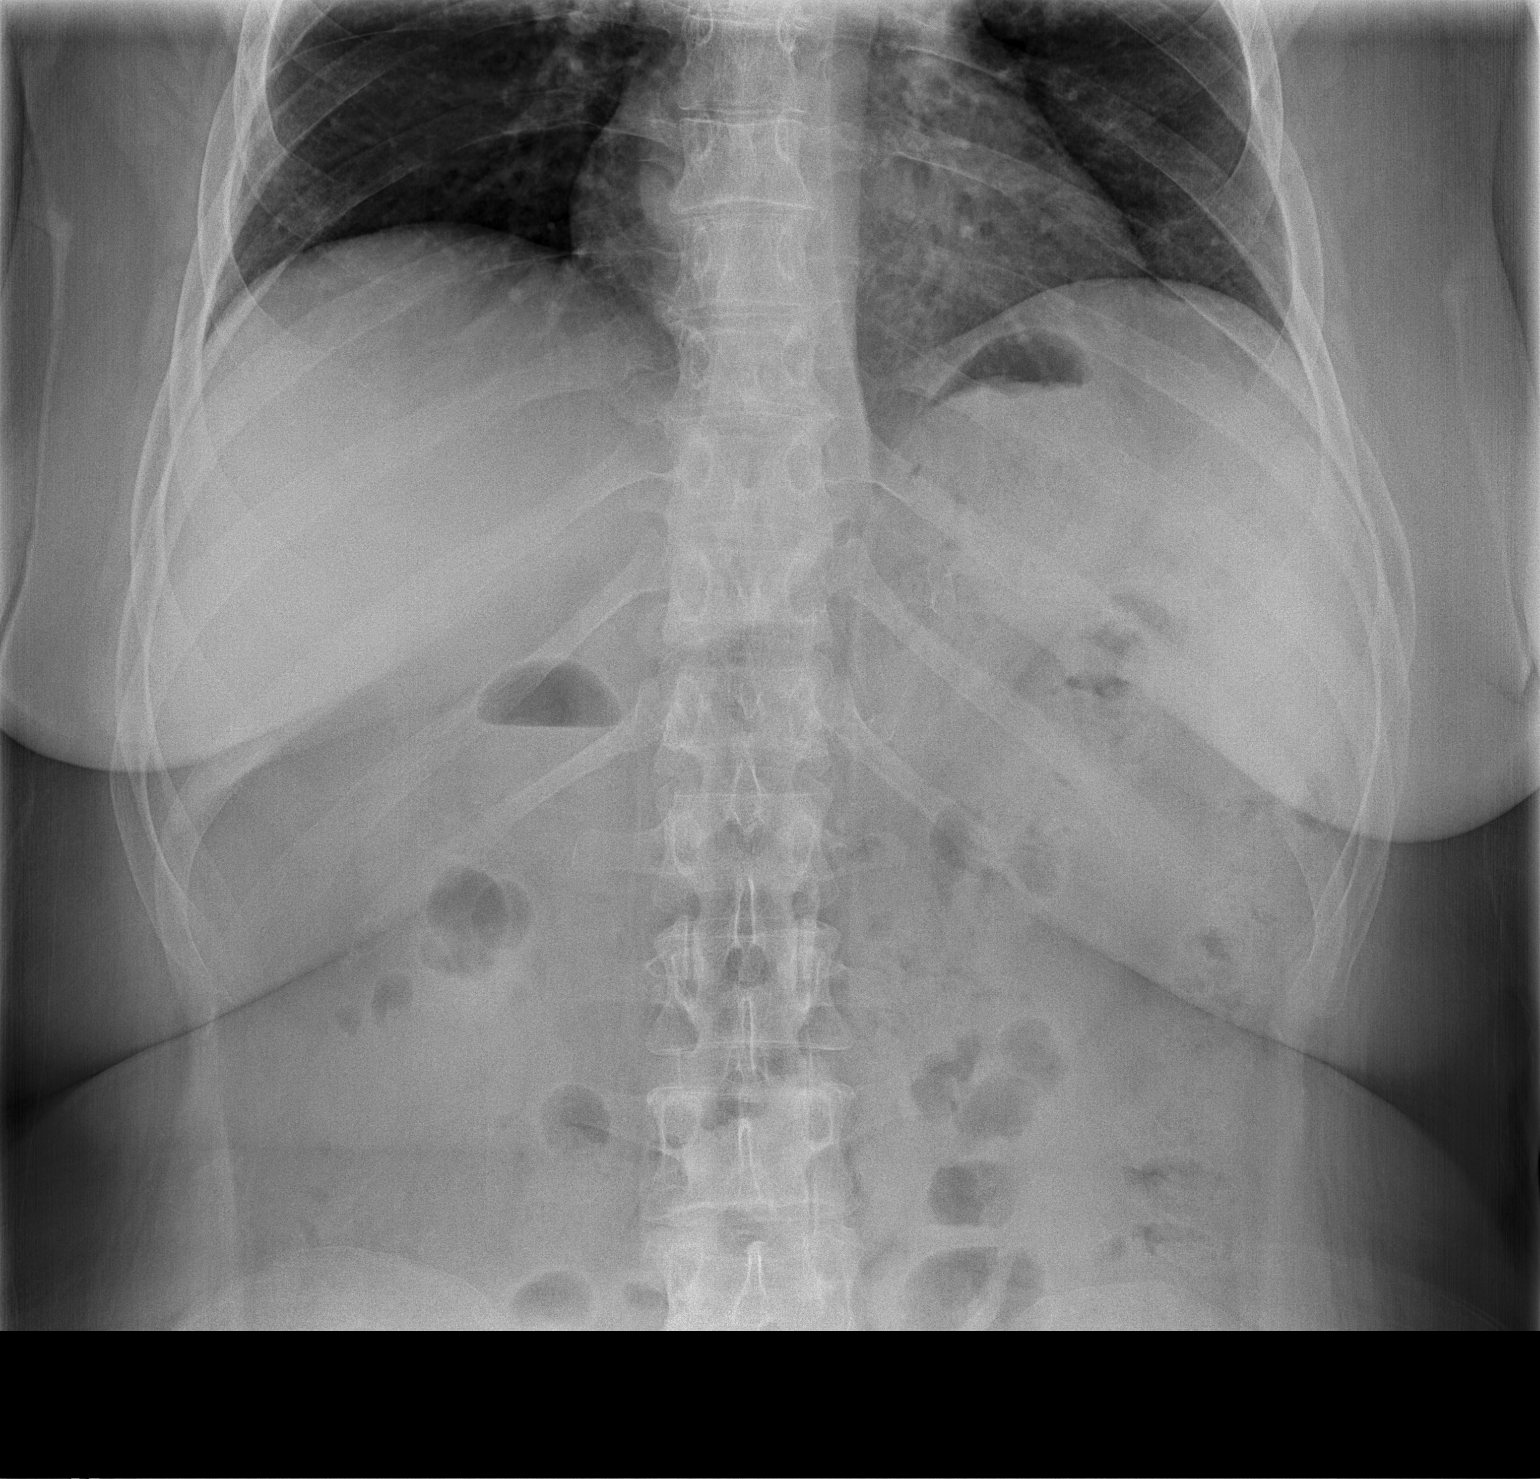

[t abdomen supine]
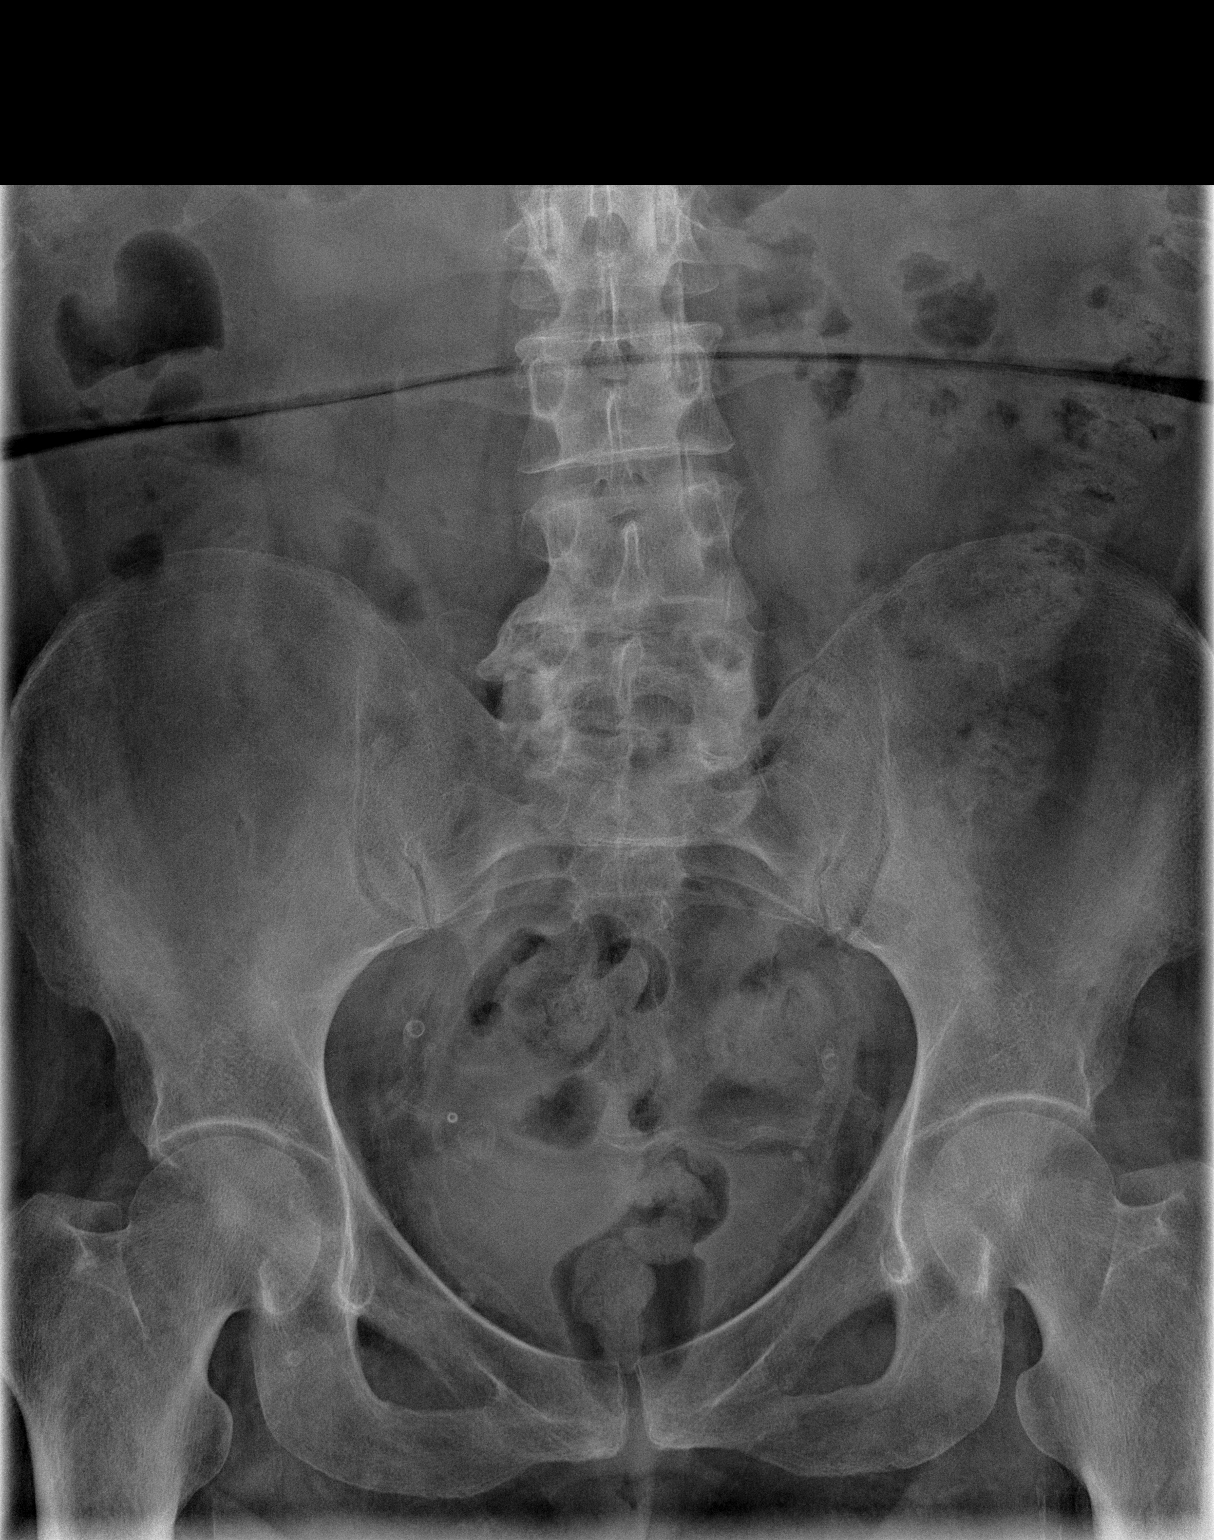

[2 of 2 positions shown; findings below may reference images not displayed]

FINDINGS: There is no evidence for gaseous bowel dilation to suggest
obstruction. No evidence for intraperitoneal free air. Vascular
calcifications seen over both pelvic sidewalls. No unexpected
abdominal pelvic calcification. Degenerative changes noted in the
lower lumbar spine.
IMPRESSION: Normal bowel gas pattern.

## 2015-05-23 MED ORDER — HYDROCODONE-ACETAMINOPHEN 5-325 MG PO TABS
1.0000 | ORAL_TABLET | Freq: Once | ORAL | Status: AC
  Administered 2015-05-23: 1 via ORAL
  Filled 2015-05-23: qty 1

## 2015-05-23 MED ORDER — CLINDAMYCIN HCL 300 MG PO CAPS
300.0000 mg | ORAL_CAPSULE | Freq: Once | ORAL | Status: AC
  Administered 2015-05-23: 300 mg via ORAL
  Filled 2015-05-23: qty 1

## 2015-05-23 MED ORDER — TETANUS-DIPHTH-ACELL PERTUSSIS 5-2.5-18.5 LF-MCG/0.5 IM SUSP
0.5000 mL | Freq: Once | INTRAMUSCULAR | Status: AC
  Administered 2015-05-23: 0.5 mL via INTRAMUSCULAR
  Filled 2015-05-23: qty 0.5

## 2015-05-23 MED ORDER — CLINDAMYCIN HCL 300 MG PO CAPS: 600.0000 mg | ORAL_CAPSULE | Freq: Once | ORAL | Status: DC

## 2015-05-23 MED ORDER — HYDROCODONE-ACETAMINOPHEN 5-325 MG PO TABS: 1.0000 | ORAL_TABLET | ORAL | Status: DC | PRN

## 2015-05-23 MED ORDER — CLINDAMYCIN HCL 300 MG PO CAPS: 300.0000 mg | ORAL_CAPSULE | Freq: Four times a day (QID) | ORAL | Status: DC

## 2015-05-23 MED ORDER — CLINDAMYCIN PHOSPHATE 600 MG/50ML IV SOLN: 600.0000 mg | Freq: Three times a day (TID) | INTRAVENOUS | Status: DC

## 2015-05-23 MED ORDER — MORPHINE SULFATE (PF) 4 MG/ML IV SOLN
4.0000 mg | Freq: Once | INTRAVENOUS | Status: DC
  Filled 2015-05-23: qty 1

## 2015-05-23 MED ORDER — CLINDAMYCIN PHOSPHATE 600 MG/50ML IV SOLN: 600.0000 mg | Freq: Once | INTRAVENOUS | Status: DC

## 2015-05-23 NOTE — ED Notes (Signed)
Pt c/o increasing R foot wound and cellulitis x 6 months.  Pain score 9/10.  Pt reports she was given antibiotics x 1.5 months ago at [facility].  Swelling and redness noted.  Hx of diabetes w/ multiple toe amputations.

## 2015-05-23 NOTE — Discharge Instructions (Signed)
Diabetes and Foot Care Diabetes may cause you to have problems because of poor blood supply (circulation) to your feet and legs. This may cause the skin on your feet to become thinner, break easier, and heal more slowly. Your skin may become dry, and the skin may peel and crack. You may also have nerve damage in your legs and feet causing decreased feeling in them. You may not notice minor injuries to your feet that could lead to infections or more serious problems. Taking care of your feet is one of the most important things you can do for yourself.  HOME CARE INSTRUCTIONS  Wear shoes at all times, even in the house. Do not go barefoot. Bare feet are easily injured.  Check your feet daily for blisters, cuts, and redness. If you cannot see the bottom of your feet, use a mirror or ask someone for help.  Wash your feet with warm water (do not use hot water) and mild soap. Then pat your feet and the areas between your toes until they are completely dry. Do not soak your feet as this can dry your skin.  Apply a moisturizing lotion or petroleum jelly (that does not contain alcohol and is unscented) to the skin on your feet and to dry, brittle toenails. Do not apply lotion between your toes.  Trim your toenails straight across. Do not dig under them or around the cuticle. File the edges of your nails with an emery board or nail file.  Do not cut corns or calluses or try to remove them with medicine.  Wear clean socks or stockings every day. Make sure they are not too tight. Do not wear knee-high stockings since they may decrease blood flow to your legs.  Wear shoes that fit properly and have enough cushioning. To break in new shoes, wear them for just a few hours a day. This prevents you from injuring your feet. Always look in your shoes before you put them on to be sure there are no objects inside.  Do not cross your legs. This may decrease the blood flow to your feet.  If you find a minor scrape,  cut, or break in the skin on your feet, keep it and the skin around it clean and dry. These areas may be cleansed with mild soap and water. Do not cleanse the area with peroxide, alcohol, or iodine.  When you remove an adhesive bandage, be sure not to damage the skin around it.  If you have a wound, look at it several times a day to make sure it is healing.  Do not use heating pads or hot water bottles. They may burn your skin. If you have lost feeling in your feet or legs, you may not know it is happening until it is too late.  Make sure your health care provider performs a complete foot exam at least annually or more often if you have foot problems. Report any cuts, sores, or bruises to your health care provider immediately. SEEK MEDICAL CARE IF:   You have an injury that is not healing.  You have cuts or breaks in the skin.  You have an ingrown nail.  You notice redness on your legs or feet.  You feel burning or tingling in your legs or feet.  You have pain or cramps in your legs and feet.  Your legs or feet are numb.  Your feet always feel cold. SEEK IMMEDIATE MEDICAL CARE IF:   There is increasing redness,  swelling, or pain in or around a wound.  There is a red line that goes up your leg.  Pus is coming from a wound.  You develop a fever or as directed by your health care provider.  You notice a bad smell coming from an ulcer or wound. Document Released: 08/21/2000 Document Revised: 04/26/2013 Document Reviewed: 01/31/2013 [facility] Patient Information 2015 [place], [place]. This information is not intended to replace advice given to you by your health care provider. Make sure you discuss any questions you have with your health care provider.  Cellulitis Cellulitis is an infection of the skin and the tissue beneath it. The infected area is usually red and tender. Cellulitis occurs most often in the arms and lower legs.  CAUSES  Cellulitis is caused by bacteria that enter  the skin through cracks or cuts in the skin. The most common types of bacteria that cause cellulitis are staphylococci and streptococci. SIGNS AND SYMPTOMS   Redness and warmth.  Swelling.  Tenderness or pain.  Fever. DIAGNOSIS  Your health care provider can usually determine what is wrong based on a physical exam. Blood tests may also be done. TREATMENT  Treatment usually involves taking an antibiotic medicine. HOME CARE INSTRUCTIONS   Take your antibiotic medicine as directed by your health care provider. Finish the antibiotic even if you start to feel better.  Keep the infected arm or leg elevated to reduce swelling.  Apply a warm cloth to the affected area up to 4 times per day to relieve pain.  Take medicines only as directed by your health care provider.  Keep all follow-up visits as directed by your health care provider. SEEK MEDICAL CARE IF:   You notice red streaks coming from the infected area.  Your red area gets larger or turns dark in color.  Your bone or joint underneath the infected area becomes painful after the skin has healed.  Your infection returns in the same area or another area.  You notice a swollen bump in the infected area.  You develop new symptoms.  You have a fever. SEEK IMMEDIATE MEDICAL CARE IF:   You feel very sleepy.  You develop vomiting or diarrhea.  You have a general ill feeling (malaise) with muscle aches and pains. MAKE SURE YOU:   Understand these instructions.  Will watch your condition.  Will get help right away if you are not doing well or get worse. Document Released: 06/03/2005 Document Revised: 01/08/2014 Document Reviewed: 11/09/2011 [facility] Patient Information 2015 [place], [place]. This information is not intended to replace advice given to you by your health care provider. Make sure you discuss any questions you have with your health care provider.

## 2015-05-23 NOTE — ED Provider Notes (Signed)
Patient with painful red right foot radiating to distal calf onset 1.5 weeks ago. She's had similar symptoms last month. Treated with antibiotics with transient related she denies fever denies vomiting no other associated symptoms. On exam alert nontoxic right lower extremity great toe second and third toes missing secondary to prior amputations. Entire dorsum of foot is red and warm and tender. A dime-sized scab lesion at the plantar surface of foot. Calf is nontender. No inguinal adenopathy Medical decision making in light of immunocompromise state. Diabetes and patient on multiple immunosuppressive drugs, and renal insufficiency, patient needs close follow-up. She will receive a course of oral antibiotics and arrange for home health to visit patient as well as the wound care clinic. Xray viewed by me  Doug Sou, MD 05/23/15 1610

## 2015-05-23 NOTE — ED Provider Notes (Signed)
CSN: [id number]     Arrival date & time 05/23/15  1014 History   First MD Initiated Contact with Patient 05/23/15 1315     Chief Complaint  Patient presents with  . Cellulitis  . Wound Check   HPI  [name] is a 49 year old female with PMHx of DM and immunosuppression secondary to renal transplant presenting for a wound check. Pt states she has chronic cellulitis (she thinks for at least a year) with an ulcer to the base of the 3rd toe on her right foot. Pt states she has noticed increased swelling, erythema and pain of the entire foot over the past few days. Pain increases with movement and when walking. Denies drainage from the ulcer. She notes the erythema has spread over the base of her foot and is extending towards the dorsum of her foot now. She was seen in ED for similar complaints one month ago and treated with a course of keflex. Endorses chills over the past few days. Denies fevers, headaches, chest pain, SOB, nausea, vomiting, diarrhea. She has chronic lumbar back pain.   Past Medical History  Diagnosis Date  . Hypertension   . Hypothyroid   . Depression   . 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 Kidney disease   . End stage renal disease     right arm AV graft, post transplant  . Diabetes mellitus     Insulin dependant   Past Surgical History  Procedure Laterality Date  . Back surgery  2001  . Tubal ligation    . Dg av dialysis graft declot or    . Nephrectomy transplanted organ    . Kidney transplant  December 16, 2009  . 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  . Cataract extraction w/ intraocular lens implant Right   . Amputation Left 05/11/2014    Procedure: AMPUTATION LEFT GREAT TOE;  Surgeon: Kathryne Hitch, MD;  Location: [facility];  Service: Orthopedics;  Laterality: Left;   Family History  Problem Relation Age of Onset  . Emphysema Mother   . Throat cancer Mother   . COPD Mother   . Cancer Mother   . Emphysema Father   . COPD Father   .  Stroke Father   . ADD / ADHD Son    Social History  Substance Use Topics  . Smoking status: Former Smoker -- 1.00 packs/day for 11 years    Types: Cigarettes    Quit date: 09/21/1998  . Smokeless tobacco: Never Used  . Alcohol Use: No   OB History    No data available     Review of Systems  Constitutional: Positive for chills. Negative for fever and diaphoresis.  Respiratory: Negative for shortness of breath.   Cardiovascular: Negative for chest pain.  Gastrointestinal: Negative for nausea, vomiting, abdominal pain and diarrhea.  Musculoskeletal: Positive for back pain, joint swelling, arthralgias and gait problem.  Skin: Positive for color change.  Allergic/Immunologic: Positive for immunocompromised state (DM and status post renal transplant).  Neurological: Negative for headaches.      Allergies  Ace inhibitors and Adhesive  Home Medications   Prior to Admission medications   Medication Sig Start Date End Date Taking? Authorizing Provider  acetaminophen (TYLENOL) 500 MG tablet Take 500-1,000 mg by mouth every 6 (six) hours as needed for headache.   Yes Historical Provider, MD  amLODipine (NORVASC) 5 MG tablet Take 1 tablet (5 mg total) by mouth daily. 03/04/15  Yes  Alison Murray, MD  carvedilol (COREG) 25 MG tablet Take 2 tablets (50 mg total) by mouth 2 (two) times daily. 03/04/15  Yes Alison Murray, MD  insulin NPH-regular Human (NOVOLIN 70/30) (70-30) 100 UNIT/ML injection Inject 30 Units into the skin 2 (two) times daily with a meal. 03/04/15  Yes Alison Murray, MD  levothyroxine (SYNTHROID, LEVOTHROID) 137 MCG tablet Take 1 tablet (137 mcg total) by mouth daily. BRAND NAME ONLY Patient taking differently: Take 137 mcg by mouth every evening. BRAND NAME ONLY 03/04/15  Yes Alison Murray, MD  mycophenolate (CELLCEPT) 250 MG capsule Take 500 mg by mouth 2 (two) times daily.    Yes Historical Provider, MD  orphenadrine (NORFLEX) 100 MG tablet Take 1 tablet (100 mg total) by  mouth 2 (two) times daily. 04/21/15  Yes Dione Booze, MD  ranitidine (ZANTAC) 150 MG capsule Take 150 mg by mouth daily as needed for heartburn.    Yes Historical Provider, MD  tacrolimus (PROGRAF) 1 MG capsule Take 1 mg by mouth 2 (two) times daily.    Yes Historical Provider, MD  cephALEXin (KEFLEX) 500 MG capsule Take 1 capsule (500 mg total) by mouth 3 (three) times daily. Patient not taking: Reported on 05/23/2015 04/21/15   Dione Booze, MD  clindamycin (CLEOCIN) 300 MG capsule Take 1 capsule (300 mg total) by mouth 4 (four) times daily. 05/23/15 06/06/15  Kojo Liby, PA-C  HYDROcodone-acetaminophen (NORCO/VICODIN) 5-325 MG per tablet Take 1-2 tablets by mouth every 4 (four) hours as needed. 05/23/15   Mycal Conde, PA-C  traMADol (ULTRAM) 50 MG tablet Take 1 tablet (50 mg total) by mouth every 6 (six) hours as needed. Patient not taking: Reported on 05/23/2015 04/21/15   Dione Booze, MD   BP 134/69 mmHg  Pulse 82  Temp(Src) 98.2 F (36.8 C) (Oral)  Resp 18  Ht 5\' 8"  (1.727 m)  Wt 225 lb (102.059 kg)  BMI 34.22 kg/m2  SpO2 96%  LMP 11/19/2011 Physical Exam  Constitutional: She appears well-developed and well-nourished. No distress.  HENT:  Head: Normocephalic and atraumatic.  Eyes: Conjunctivae are normal. Right eye exhibits no discharge. Left eye exhibits no discharge. No scleral icterus.  Neck: Normal range of motion.  Cardiovascular: Normal rate, regular rhythm and normal heart sounds.   Pulmonary/Chest: Effort normal and breath sounds normal. No respiratory distress. She has no wheezes. She has no rales.  Abdominal: Soft. There is no tenderness. There is no rebound and no guarding.  Musculoskeletal:  Great toe, 2nd and 3rd to amputations of right foot. Blackened eschar at base of 3rd toe without drainage. Erythema extends over the plantar surface of the foot and over the base of 3rd toe amputation site. Right foot is edematous and TTP over foot and ankle. No TTP of calf. FROM of  ankle and knee intact.    Neurological: She is alert. Coordination normal.  Sensation decreased over right foot 2/2 diabetic neuropathy.   Skin: Skin is warm and dry. There is erythema.  Erythema over plantar surface of right foot and base of 3rd toe amputation site. Blackened eschar at base of 3rd toe amputation. No drainage  Psychiatric: She has a normal mood and affect. Her behavior is normal.  Nursing note and vitals reviewed.   ED Course  Procedures (including critical care time) Labs Review Labs Reviewed  BASIC METABOLIC PANEL - Abnormal; Notable for the following:    Sodium 134 (*)    Glucose, Bld 283 (*)  BUN 21 (*)    Creatinine, Ser 1.42 (*)    Calcium 10.5 (*)    GFR calc non Af Amer 43 (*)    GFR calc Af Amer 50 (*)    All other components within normal limits  CBG MONITORING, ED - Abnormal; Notable for the following:    Glucose-Capillary 283 (*)    All other components within normal limits  CBC    Imaging Review Dg Foot Complete Right  05/23/2015   CLINICAL DATA:  Right foot wound and cellulitis for 6 months. Pain. Swelling and erythema.  EXAM: RIGHT FOOT COMPLETE - 3+ VIEW  COMPARISON:  03/08/2015  FINDINGS: Stable prior amputations of the first, second, and third digits. No progressive bony destructive findings. Old Jones fracture of the fifth metatarsal, without definite bridging.  No malalignment at the Lisfranc joint. Vascular calcifications noted.  Mild dorsal subcutaneous soft tissue swelling. Plantar calcaneal spur. Ill definition of the posterior subtalar facet due to obliquity.  IMPRESSION: 1. No current bony destructive findings characteristic of osteomyelitis. 2. Prior amputations of the first 3 digits. Prior Jones fracture of the fifth metatarsal, with questionable bony bridging, stable. 3. Dorsal soft tissue swelling. No abnormal gas tracking in the soft tissues.   Electronically Signed   By: Gaylyn Rong M.D.   On: 05/23/2015 14:55   I have  personally reviewed and evaluated these images and lab results as part of my medical decision-making.   EKG Interpretation None      MDM   Final diagnoses:  Cellulitis of right lower extremity  Immunosuppression  Diabetic ulcer of right foot associated with type 2 diabetes mellitus   Pt presenting with worsening cellulitis of right foot. Pt reports increased swelling, pain and erythema. Pt was seen 1.5 months ago for cellulitis of foot and treated with keflex. Reports improvement initially but worsening symptoms over past few days. Pt is immunocompromised 2/2 renal transplant and DM. VSS. Pt nontoxic. Right foot with erythema and swelling as noted above. Foot xray shows no signs of osteomyelitis or gas tracking. WB 8.6. Consult to hospitalist for inpatient IV ABX. Hospitalist recommends outpatient therapy. Consulted pharmacy who recommend Clindamycin 300 mg QID for 14 days. Pt will need close follow up but pt does not have PCP, her listed PCP is her nephrologist. Care management saw patient and set her up with home health nurse for wound care, an appointment at the wound clinic October 7, options for establishing PCP and resources for transportation. Return precautions discussed at length with pt and given in discharge paperwork. Pt seen in conjunction with Dr. Ethelda Chick who agrees with assessment and plan. Pt stable for discharge.    Rolm Gala Izabel Chim, PA-C 05/23/15 2231  Doug Sou, MD 05/26/15 1212

## 2015-05-23 NOTE — Care Management Note (Signed)
Case Management Note  Patient Details  Name: [name] MRN: [id number] Date of Birth: [birth year]  Subjective/Objective:   Patient presents to Ed with redness/cellulitis to right lower extremity.  Patient history of amputated toes due to Diabetes.                   Action/Plan:  Discussed home health services for wound care to right lower extremity and arranged wound care clinic appointment   Expected Discharge Date:   05/23/2015               Expected Discharge Plan:  Home w Home Health Services  In-House Referral:     Discharge planning Services  CM Consult  Post Acute Care Choice:  Home Health Choice offered to:  Patient  DME Arranged:   (none required per patient) DME Agency:     HH Arranged:  RN (for wound care) HH Agency:  [facility]  Status of Service:  Completed, signed off  Medicare Important Message Given:    Date Medicare IM Given:    Medicare IM give by:    Date Additional Medicare IM Given:    Additional Medicare Important Message give by:     If discussed at Long Length of Stay Meetings, dates discussed:    Additional Comments: Patient lives with her husband and son at home.  Patient reports no one drives in her household and is concerned how she will get to wound care clinic and to pick up her prescriptions.  Patient reports her neighbor takes her to some appointments but she has to pay her gas money.  Patient reports she does not get paid until Tuesday.  EDCM went to [facility] out patient pharmacy to pick up patient's Rx's, money supplied by patient.  EDCM counted out vicodin 15 tablets with RN Cicero Duck in front of patient.  Four dollars change given to patient.  EDCM provided patient with the phone number to [facility] for medical transportation.  EDCM also provided patient with list of pcps who accept Medicare insurance within a ten mile radius of patient's zip code.  Pcp listed in patient's chart is patient's nephrologist but patient reports he also manages  her diabetes.  EDCM also provided patient with list of pharmacies that deliver.  [facility] provided patient with list of home health agencies in [facility] of which patient has chosen [facility] as she has had them in the past.  [facility] also arranged appointment at wound care clinic on October 7th at 1015am.  This information was placed on patient's discharge instructions and also written and given to patient.  Patient thankful for services.  No further EDCM needs at this time.  [facility] faxed home health referral to [facility] with confirmation of receipt.  Bennie Dallas, Savi Lastinger, RN 05/23/2015, 6:19 PM

## 2015-05-24 NOTE — Progress Notes (Addendum)
EDCM received phone call from [place] [facility] reporting they are unable to provide services for wound care for patient without a pcp.  Reports she has left a message at Dr. Meredith Mody office requesting if they would be willing to sign home health orders for patient but has not heard back yet.  [facility] called [facility] office at [phone] and left voice message for [facility] for Dr. Meredith Mody requesting call back in regards to signing home health orders.  Awaiting call back.  05/27/2015 A. Janera Peugh RNCM 1617pm  EDCM called patient for follow up and to inform patient of delay in home health services without success.  EDCM left voice message with phone number for call back.  05/18/2015 A. Chinedu Agustin RNCM 1703pm EDCM received phone call from [facility] from [facility] who reports Dr. Arrie Aran has agreed to sign orders for home health for patient.  Left message for Belenda Cruise of [facility].

## 2015-06-01 ENCOUNTER — Inpatient Hospital Stay (HOSPITAL_COMMUNITY)
Admission: EM | Admit: 2015-06-01 | Discharge: 2015-06-06 | DRG: 617 | Disposition: A | Discharge status: Home Health | Payer: Medicare Other | Attending: Internal Medicine | Admitting: Internal Medicine

## 2015-06-01 ENCOUNTER — Emergency Department (HOSPITAL_COMMUNITY): Payer: Medicare Other

## 2015-06-01 ENCOUNTER — Encounter (HOSPITAL_COMMUNITY): Payer: Self-pay

## 2015-06-01 DIAGNOSIS — M869 Osteomyelitis, unspecified: Secondary | ICD-10-CM | POA: Diagnosis not present

## 2015-06-01 DIAGNOSIS — E1169 Type 2 diabetes mellitus with other specified complication: Secondary | ICD-10-CM | POA: Diagnosis not present

## 2015-06-01 DIAGNOSIS — R197 Diarrhea, unspecified: Secondary | ICD-10-CM | POA: Diagnosis present

## 2015-06-01 DIAGNOSIS — Z89411 Acquired absence of right great toe: Secondary | ICD-10-CM

## 2015-06-01 DIAGNOSIS — K219 Gastro-esophageal reflux disease without esophagitis: Secondary | ICD-10-CM | POA: Diagnosis present

## 2015-06-01 DIAGNOSIS — Z87891 Personal history of nicotine dependence: Secondary | ICD-10-CM | POA: Diagnosis not present

## 2015-06-01 DIAGNOSIS — Z792 Long term (current) use of antibiotics: Secondary | ICD-10-CM | POA: Diagnosis not present

## 2015-06-01 DIAGNOSIS — L97509 Non-pressure chronic ulcer of other part of unspecified foot with unspecified severity: Secondary | ICD-10-CM | POA: Diagnosis present

## 2015-06-01 DIAGNOSIS — IMO0002 Reserved for concepts with insufficient information to code with codable children: Secondary | ICD-10-CM | POA: Diagnosis present

## 2015-06-01 DIAGNOSIS — E1065 Type 1 diabetes mellitus with hyperglycemia: Secondary | ICD-10-CM | POA: Diagnosis present

## 2015-06-01 DIAGNOSIS — D849 Immunodeficiency, unspecified: Secondary | ICD-10-CM

## 2015-06-01 DIAGNOSIS — N179 Acute kidney failure, unspecified: Secondary | ICD-10-CM | POA: Diagnosis present

## 2015-06-01 DIAGNOSIS — E10649 Type 1 diabetes mellitus with hypoglycemia without coma: Secondary | ICD-10-CM | POA: Diagnosis not present

## 2015-06-01 DIAGNOSIS — I129 Hypertensive chronic kidney disease with stage 1 through stage 4 chronic kidney disease, or unspecified chronic kidney disease: Secondary | ICD-10-CM | POA: Diagnosis present

## 2015-06-01 DIAGNOSIS — Z794 Long term (current) use of insulin: Secondary | ICD-10-CM

## 2015-06-01 DIAGNOSIS — Z7952 Long term (current) use of systemic steroids: Secondary | ICD-10-CM | POA: Diagnosis not present

## 2015-06-01 DIAGNOSIS — Z79899 Other long term (current) drug therapy: Secondary | ICD-10-CM

## 2015-06-01 DIAGNOSIS — E860 Dehydration: Secondary | ICD-10-CM | POA: Diagnosis present

## 2015-06-01 DIAGNOSIS — E039 Hypothyroidism, unspecified: Secondary | ICD-10-CM | POA: Diagnosis present

## 2015-06-01 DIAGNOSIS — M86171 Other acute osteomyelitis, right ankle and foot: Secondary | ICD-10-CM | POA: Diagnosis present

## 2015-06-01 DIAGNOSIS — D899 Disorder involving the immune mechanism, unspecified: Secondary | ICD-10-CM

## 2015-06-01 DIAGNOSIS — E1069 Type 1 diabetes mellitus with other specified complication: Secondary | ICD-10-CM | POA: Diagnosis present

## 2015-06-01 DIAGNOSIS — M9080 Osteopathy in diseases classified elsewhere, unspecified site: Secondary | ICD-10-CM | POA: Diagnosis not present

## 2015-06-01 DIAGNOSIS — E11628 Type 2 diabetes mellitus with other skin complications: Secondary | ICD-10-CM

## 2015-06-01 DIAGNOSIS — L02611 Cutaneous abscess of right foot: Secondary | ICD-10-CM | POA: Diagnosis present

## 2015-06-01 DIAGNOSIS — N183 Chronic kidney disease, stage 3 unspecified: Secondary | ICD-10-CM | POA: Diagnosis present

## 2015-06-01 DIAGNOSIS — Z89421 Acquired absence of other right toe(s): Secondary | ICD-10-CM

## 2015-06-01 DIAGNOSIS — I10 Essential (primary) hypertension: Secondary | ICD-10-CM | POA: Diagnosis present

## 2015-06-01 DIAGNOSIS — E1022 Type 1 diabetes mellitus with diabetic chronic kidney disease: Secondary | ICD-10-CM | POA: Diagnosis present

## 2015-06-01 DIAGNOSIS — Z940 Kidney transplant status: Secondary | ICD-10-CM | POA: Diagnosis not present

## 2015-06-01 DIAGNOSIS — L97519 Non-pressure chronic ulcer of other part of right foot with unspecified severity: Secondary | ICD-10-CM | POA: Diagnosis present

## 2015-06-01 DIAGNOSIS — E10621 Type 1 diabetes mellitus with foot ulcer: Secondary | ICD-10-CM | POA: Diagnosis not present

## 2015-06-01 DIAGNOSIS — E10628 Type 1 diabetes mellitus with other skin complications: Secondary | ICD-10-CM | POA: Diagnosis present

## 2015-06-01 DIAGNOSIS — I959 Hypotension, unspecified: Secondary | ICD-10-CM | POA: Diagnosis not present

## 2015-06-01 DIAGNOSIS — E1110 Type 2 diabetes mellitus with ketoacidosis without coma: Secondary | ICD-10-CM | POA: Diagnosis present

## 2015-06-01 DIAGNOSIS — L089 Local infection of the skin and subcutaneous tissue, unspecified: Secondary | ICD-10-CM | POA: Diagnosis present

## 2015-06-01 LAB — I-STAT CHEM 8, ED
BUN: 36 mg/dL — ABNORMAL HIGH (ref 6–20)
CHLORIDE: 105 mmol/L (ref 101–111)
Calcium, Ion: 1.3 mmol/L — ABNORMAL HIGH (ref 1.12–1.23)
Creatinine, Ser: 1.4 mg/dL — ABNORMAL HIGH (ref 0.44–1.00)
Glucose, Bld: 115 mg/dL — ABNORMAL HIGH (ref 65–99)
HCT: 41 % (ref 36.0–46.0)
Hemoglobin: 13.9 g/dL (ref 12.0–15.0)
Potassium: 4.7 mmol/L (ref 3.5–5.1)
SODIUM: 140 mmol/L (ref 135–145)
TCO2: 23 mmol/L (ref 0–100)

## 2015-06-01 LAB — CBC WITH DIFFERENTIAL/PLATELET
BASOS ABS: 0 10*3/uL (ref 0.0–0.1)
Basophils Relative: 0 %
EOS ABS: 0.2 10*3/uL (ref 0.0–0.7)
Eosinophils Relative: 3 %
HCT: 38.8 % (ref 36.0–46.0)
HEMOGLOBIN: 12.3 g/dL (ref 12.0–15.0)
Lymphocytes Relative: 22 %
Lymphs Abs: 1.5 10*3/uL (ref 0.7–4.0)
MCH: 28.5 pg (ref 26.0–34.0)
MCHC: 31.7 g/dL (ref 30.0–36.0)
MCV: 90 fL (ref 78.0–100.0)
Monocytes Absolute: 0.6 10*3/uL (ref 0.1–1.0)
Monocytes Relative: 9 %
NEUTROS PCT: 66 %
Neutro Abs: 4.5 10*3/uL (ref 1.7–7.7)
Platelets: 336 10*3/uL (ref 150–400)
RBC: 4.31 MIL/uL (ref 3.87–5.11)
RDW: 12.9 % (ref 11.5–15.5)
WBC: 6.7 10*3/uL (ref 4.0–10.5)

## 2015-06-01 MED ORDER — LEVOTHYROXINE SODIUM 137 MCG PO TABS
137.0000 ug | ORAL_TABLET | Freq: Every evening | ORAL | Status: DC
  Administered 2015-06-02 – 2015-06-05 (×4): 137 ug via ORAL
  Filled 2015-06-01 (×5): qty 1

## 2015-06-01 MED ORDER — HYDROCODONE-ACETAMINOPHEN 5-325 MG PO TABS
1.0000 | ORAL_TABLET | ORAL | Status: DC | PRN
  Administered 2015-06-03 – 2015-06-06 (×8): 2 via ORAL
  Filled 2015-06-01 (×8): qty 2

## 2015-06-01 MED ORDER — MYCOPHENOLATE MOFETIL 250 MG PO CAPS
500.0000 mg | ORAL_CAPSULE | Freq: Two times a day (BID) | ORAL | Status: DC
  Administered 2015-06-02 – 2015-06-06 (×9): 500 mg via ORAL
  Filled 2015-06-01 (×10): qty 2

## 2015-06-01 MED ORDER — VANCOMYCIN HCL 10 G IV SOLR
1250.0000 mg | INTRAVENOUS | Status: DC
  Administered 2015-06-02: 1250 mg via INTRAVENOUS
  Filled 2015-06-01 (×2): qty 1250

## 2015-06-01 MED ORDER — PIPERACILLIN-TAZOBACTAM 3.375 G IVPB
3.3750 g | Freq: Three times a day (TID) | INTRAVENOUS | Status: DC
  Administered 2015-06-02 – 2015-06-04 (×7): 3.375 g via INTRAVENOUS
  Filled 2015-06-01 (×10): qty 50

## 2015-06-01 MED ORDER — CARVEDILOL 25 MG PO TABS
50.0000 mg | ORAL_TABLET | Freq: Two times a day (BID) | ORAL | Status: DC
  Administered 2015-06-02 – 2015-06-04 (×5): 50 mg via ORAL
  Filled 2015-06-01 (×5): qty 2
  Filled 2015-06-01: qty 4

## 2015-06-01 MED ORDER — MORPHINE SULFATE (PF) 2 MG/ML IV SOLN
2.0000 mg | INTRAVENOUS | Status: DC | PRN
  Administered 2015-06-02 (×2): 2 mg via INTRAVENOUS
  Filled 2015-06-01 (×2): qty 1

## 2015-06-01 MED ORDER — PIPERACILLIN-TAZOBACTAM 3.375 G IVPB 30 MIN: 3.3750 g | Freq: Three times a day (TID) | INTRAVENOUS | Status: DC

## 2015-06-01 MED ORDER — SODIUM CHLORIDE 0.9 % IV SOLN
INTRAVENOUS | Status: DC
  Administered 2015-06-02 (×2): via INTRAVENOUS
  Administered 2015-06-04 – 2015-06-05 (×3): 1000 mL via INTRAVENOUS
  Administered 2015-06-06: 05:00:00 via INTRAVENOUS

## 2015-06-01 MED ORDER — HEPARIN SODIUM (PORCINE) 5000 UNIT/ML IJ SOLN
5000.0000 [IU] | Freq: Three times a day (TID) | INTRAMUSCULAR | Status: DC
  Administered 2015-06-02 – 2015-06-06 (×12): 5000 [IU] via SUBCUTANEOUS
  Filled 2015-06-01 (×13): qty 1

## 2015-06-01 MED ORDER — AMLODIPINE BESYLATE 5 MG PO TABS
5.0000 mg | ORAL_TABLET | Freq: Every day | ORAL | Status: DC
  Administered 2015-06-02 – 2015-06-04 (×3): 5 mg via ORAL
  Filled 2015-06-01 (×3): qty 1

## 2015-06-01 MED ORDER — ONDANSETRON HCL 4 MG PO TABS
4.0000 mg | ORAL_TABLET | Freq: Three times a day (TID) | ORAL | Status: DC | PRN
  Administered 2015-06-02: 4 mg via ORAL
  Filled 2015-06-01: qty 1

## 2015-06-01 MED ORDER — SODIUM CHLORIDE 0.9 % IJ SOLN
3.0000 mL | Freq: Two times a day (BID) | INTRAMUSCULAR | Status: DC
  Administered 2015-06-02 – 2015-06-06 (×5): 3 mL via INTRAVENOUS

## 2015-06-01 MED ORDER — INSULIN ASPART PROT & ASPART (70-30 MIX) 100 UNIT/ML ~~LOC~~ SUSP
25.0000 [IU] | Freq: Two times a day (BID) | SUBCUTANEOUS | Status: DC
  Administered 2015-06-02 – 2015-06-03 (×3): 25 [IU] via SUBCUTANEOUS
  Filled 2015-06-01: qty 10

## 2015-06-01 MED ORDER — INSULIN ASPART 100 UNIT/ML ~~LOC~~ SOLN
0.0000 [IU] | Freq: Three times a day (TID) | SUBCUTANEOUS | Status: DC
  Administered 2015-06-02: 5 [IU] via SUBCUTANEOUS
  Administered 2015-06-02: 3 [IU] via SUBCUTANEOUS
  Administered 2015-06-02 – 2015-06-03 (×2): 5 [IU] via SUBCUTANEOUS
  Administered 2015-06-03: 3 [IU] via SUBCUTANEOUS

## 2015-06-01 MED ORDER — ONDANSETRON HCL 4 MG PO TABS
4.0000 mg | ORAL_TABLET | Freq: Once | ORAL | Status: AC
  Administered 2015-06-01: 4 mg via ORAL
  Filled 2015-06-01: qty 1

## 2015-06-01 MED ORDER — HYDROCORTISONE NA SUCCINATE PF 100 MG IJ SOLR
50.0000 mg | Freq: Once | INTRAMUSCULAR | Status: AC
  Administered 2015-06-02: 50 mg via INTRAVENOUS
  Filled 2015-06-01: qty 2

## 2015-06-01 MED ORDER — VANCOMYCIN HCL IN DEXTROSE 1-5 GM/200ML-% IV SOLN
1000.0000 mg | Freq: Once | INTRAVENOUS | Status: AC
  Administered 2015-06-02: 1000 mg via INTRAVENOUS
  Filled 2015-06-01: qty 200

## 2015-06-01 MED ORDER — PIPERACILLIN-TAZOBACTAM 3.375 G IVPB 30 MIN
3.3750 g | Freq: Once | INTRAVENOUS | Status: AC
  Administered 2015-06-01: 3.375 g via INTRAVENOUS
  Filled 2015-06-01: qty 50

## 2015-06-01 MED ORDER — ACETAMINOPHEN 325 MG PO TABS: 650.0000 mg | ORAL_TABLET | Freq: Four times a day (QID) | ORAL | Status: DC | PRN

## 2015-06-01 MED ORDER — TACROLIMUS 1 MG PO CAPS
1.0000 mg | ORAL_CAPSULE | Freq: Two times a day (BID) | ORAL | Status: DC
  Administered 2015-06-02 – 2015-06-06 (×9): 1 mg via ORAL
  Filled 2015-06-01 (×11): qty 1

## 2015-06-01 MED ORDER — OXYCODONE-ACETAMINOPHEN 5-325 MG PO TABS
1.0000 | ORAL_TABLET | Freq: Once | ORAL | Status: AC
  Administered 2015-06-01: 1 via ORAL
  Filled 2015-06-01: qty 1

## 2015-06-01 MED ORDER — FAMOTIDINE 20 MG PO TABS
20.0000 mg | ORAL_TABLET | Freq: Every day | ORAL | Status: DC
  Administered 2015-06-02 – 2015-06-06 (×5): 20 mg via ORAL
  Filled 2015-06-01 (×5): qty 1

## 2015-06-01 MED ORDER — ORPHENADRINE CITRATE ER 100 MG PO TB12
100.0000 mg | ORAL_TABLET | Freq: Two times a day (BID) | ORAL | Status: DC
  Administered 2015-06-03 – 2015-06-06 (×6): 100 mg via ORAL
  Filled 2015-06-01 (×12): qty 1

## 2015-06-01 NOTE — H&P (Signed)
[facility] History and Physical  [name] RDE:[id number] DOB: 1965/09/10 DOA: 06/01/2015  Referring physician: ED physician PCP: Donetta Potts, MD  Specialists:   Chief Complaint: right foot ulcers  HPI: [name] is a 49 y.o. 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  Patient reports that she has a ulcer over the plantar aspects of right foot, which has been going on for about 3 months. It is painful and red. She was seen at [facility] on 05/23/15. She was given oral Keflex, and arranged home health nurse visit and recommended wound clinic treatment. She states that she noticed second ulceration to top of her foot at the base of third toe (first three toes amputated) yesterday. She states it has been popped and now has pink colored drainage from wound. It is very painful. The R foot is swelling. She has nausea, and vomited twice today. She does not have fever or chills. She state thats she had mild diarrhea since. No abdominal pain, symptoms of UTI, rashes, unilateral weakness.  In ED, patient was found to have WBC 6.7, temperature normal, no tachycardia, stable renal function. X-ray of her right foot did not show osteomyelitis.  Where does patient live?   At home    Can patient participate in ADLs?  Some   Review of Systems:   General: no fevers, chills, no changes in body weight, has poor appetite, has fatigue HEENT: no blurry vision, hearing changes or sore throat Pulm: no dyspnea, coughing, wheezing CV: no chest pain, palpitations Abd: has nausea, vomiting, no abdominal pain, diarrhea, constipation GU: no dysuria, burning on urination, increased urinary frequency, hematuria  Ext: no leg edema Neuro: no unilateral weakness, numbness, or tingling, no vision change or hearing loss Skin: no rash MSK: No  muscle spasm, no deformity, no limitation of range of movement in spin Heme: No easy bruising.  Travel history: No recent long distant travel.  Allergy:  Allergies  Allergen Reactions  . Ace Inhibitors Other (See Comments)    Cough   ( Lisinopril )  . Adhesive [Tape] Itching    Past Medical History  Diagnosis Date  . Hypertension   . Hypothyroid   . Depression   . 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 Kidney disease   . End stage renal disease     right arm AV graft, post transplant  . Diabetes mellitus     Insulin dependant    Past Surgical History  Procedure Laterality Date  . Back surgery  2001  . Tubal ligation    . Dg av dialysis graft declot or    . Nephrectomy transplanted organ    . Kidney transplant  December 16, 2009  . 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  . Cataract extraction w/ intraocular lens implant Right   . Amputation Left 05/11/2014    Procedure: AMPUTATION LEFT GREAT TOE;  Surgeon: Mcarthur Rossetti, MD;  Location: [facility];  Service: Orthopedics;  Laterality: Left;    Social History:  reports that she quit smoking about 16 years ago. Her smoking use included Cigarettes. She has a 11 pack-year smoking history. She has never used smokeless tobacco. She reports that she does not drink alcohol or use illicit drugs.  Family History:  Family History  Problem Relation Age of Onset  . Emphysema Mother   .  Throat cancer Mother   . COPD Mother   . Cancer Mother   . Emphysema Father   . COPD Father   . Stroke Father   . ADD / ADHD Son      Prior to Admission medications   Medication Sig Start Date End Date Taking? Authorizing Provider  acetaminophen (TYLENOL) 500 MG tablet Take 500-1,000 mg by mouth every 6 (six) hours as needed for headache.    Historical Provider, MD  amLODipine (NORVASC) 5 MG tablet Take 1 tablet (5 mg total) by mouth daily. 03/04/15   Robbie Lis, MD  carvedilol (COREG) 25 MG tablet Take 2 tablets (50 mg total)  by mouth 2 (two) times daily. 03/04/15   Robbie Lis, MD  cephALEXin (KEFLEX) 500 MG capsule Take 1 capsule (500 mg total) by mouth 3 (three) times daily. Patient not taking: Reported on 05/23/2015 12/30/93   Delora Fuel, MD  clindamycin (CLEOCIN) 300 MG capsule Take 1 capsule (300 mg total) by mouth 4 (four) times daily. 05/23/15 06/06/15  Stevi Barrett, PA-C  HYDROcodone-acetaminophen (NORCO/VICODIN) 5-325 MG per tablet Take 1-2 tablets by mouth every 4 (four) hours as needed. 05/23/15   Stevi Barrett, PA-C  insulin NPH-regular Human (NOVOLIN 70/30) (70-30) 100 UNIT/ML injection Inject 30 Units into the skin 2 (two) times daily with a meal. 03/04/15   Robbie Lis, MD  levothyroxine (SYNTHROID, LEVOTHROID) 137 MCG tablet Take 1 tablet (137 mcg total) by mouth daily. BRAND NAME ONLY Patient taking differently: Take 137 mcg by mouth every evening. BRAND NAME ONLY 03/04/15   Robbie Lis, MD  mycophenolate (CELLCEPT) 250 MG capsule Take 500 mg by mouth 2 (two) times daily.     Historical Provider, MD  orphenadrine (NORFLEX) 100 MG tablet Take 1 tablet (100 mg total) by mouth 2 (two) times daily. 6/38/75   Delora Fuel, MD  ranitidine (ZANTAC) 150 MG capsule Take 150 mg by mouth daily as needed for heartburn.     Historical Provider, MD  tacrolimus (PROGRAF) 1 MG capsule Take 1 mg by mouth 2 (two) times daily.     Historical Provider, MD  traMADol (ULTRAM) 50 MG tablet Take 1 tablet (50 mg total) by mouth every 6 (six) hours as needed. Patient not taking: Reported on 05/23/2015 6/43/32   Delora Fuel, MD    Physical Exam: Filed Vitals:   06/01/15 2215 06/01/15 2300 06/02/15 0000 06/02/15 0000  BP: 138/58   135/63  Pulse: 78 79  83  Temp:    97.9 F (36.6 C)  TempSrc:    Oral  Resp:    20  Height:   _0  (1.727 m)   Weight:   96.7 kg (213 lb 3 oz)   SpO2: 96% 96%  95%   General: Not in acute distress HEENT:       Eyes: PERRL, EOMI, no scleral icterus.       ENT: No discharge from the ears and  nose, no pharynx injection, no tonsillar enlargement.        Neck: No JVD, no bruit, no mass felt. Heme: No neck lymph node enlargement. Cardiac: S1/S2, RRR, No murmurs, No gallops or rubs. Pulm: No rales, wheezing, rhonchi or rubs. Abd: Soft, nondistended, nontender, no rebound pain, no organomegaly, BS present. Ext: has R foot swelling. Has venous insufficiency changes in both legs. There are two ulcers over the base of amputated third toe, one is on the top and another is on the plantar area. There is  swelling, erythema and tenderness in surroundings. Has serous sanguinous discharge her from the top ulcer. 1+DP/PT pulse bilaterally. Musculoskeletal: No joint deformities, No joint redness or warmth, no limitation of ROM in spin. Skin: No rashes.  Neuro: Alert, oriented X3, cranial nerves II-XII grossly intact, muscle strength 5/5 in all extremities, sensation to light touch intact.  Psych: Patient is not psychotic, no suicidal or hemocidal ideation.  Labs on Admission:  Basic Metabolic Panel:  Recent Labs Lab 06/01/15 1721  NA 140  K 4.7  CL 105  GLUCOSE 115*  BUN 36*  CREATININE 1.40*   Liver Function Tests: No results for input(s): AST, ALT, ALKPHOS, BILITOT, PROT, ALBUMIN in the last 168 hours. No results for input(s): LIPASE, AMYLASE in the last 168 hours. No results for input(s): AMMONIA in the last 168 hours. CBC:  Recent Labs Lab 06/01/15 1710 06/01/15 1721  WBC 6.7  --   NEUTROABS 4.5  --   HGB 12.3 13.9  HCT 38.8 41.0  MCV 90.0  --   PLT 336  --    Cardiac Enzymes: No results for input(s): CKTOTAL, CKMB, CKMBINDEX, TROPONINI in the last 168 hours.  BNP (last 3 results) No results for input(s): BNP in the last 8760 hours.  ProBNP (last 3 results) No results for input(s): PROBNP in the last 8760 hours.  CBG:  Recent Labs Lab 06/02/15 0101  GLUCAP 142*    Radiological Exams on Admission: Dg Foot Complete Right  06/01/2015   CLINICAL DATA:  Soft  tissue infection in the distal right foot  EXAM: RIGHT FOOT COMPLETE - 3+ VIEW  COMPARISON:  05/23/2015  FINDINGS: There again noted changes consistent with amputation of the first through third digits. Fractures noted in the proximal fifth metatarsal. Some generalized soft tissue swelling is noted distally. No areas of bony erosion to suggest osteomyelitis are seen.  IMPRESSION: Soft tissue changes without acute osteomyelitis.   Electronically Signed   By: Inez Catalina M.D.   On: 06/01/2015 21:57    EKG:  Not done in ED, will get one.   Assessment/Plan Principal Problem:   Diabetic foot infection Active Problems:   Immunosuppression   Hypothyroidism   H/O kidney transplant   Essential hypertension   GERD (gastroesophageal reflux disease)   CKD (chronic kidney disease), stage III   Diabetes mellitus without complication   Diarrhea  Diabetic foot infection: X-ray did not show osteomyelitis. It is harder to tell whether the infection has involved the bone. Patient needs MRI for further evaluation. Because of her current CKD-III and history of kidney transplantation, I will hydrate her tonight, and do MRI tomorrow (not ordered yet). Patient does not meet criteria for sepsis on admission. Hemodynamically stable. - will admit to tele bed - Empiric antimicrobial treatment with vancomycin and Zosyn per pharmacy - PRN Zofran for nausea, morphine and Norco for pain - Blood cultures x 2  - ESR and CRP - wound care consult - will get Procalcitonin and trend lactic acid levels  - IVF: 100 cc/h  - INR/PTT/type & screen - Please consult ortho in AM  DM-II: Last A1c 11.6 on 03/02/15 , poorly controled. Patient is taking 70/30 insulin at home -will decrease insulin dose fom 30 to 25 units bid -SSI  H/O kidney transplant 2011: Patient has been compliant into mycophenolate, tacrolimus and prednisone -Continue home immunosuppressants -Stress dose of Solu Cortef 50 mg 1 -Check cortisol level in  morning  CKD (chronic kidney disease), stage III: Baseline creatinine 1.0-1.4. Her  creatinine is 1.40, BUN 36, which is close to baseline. -Follow-up renal function by BMP  Hypothyroidism: Last TSH was 0.813 on 01/02/15 -Continue home Synthroid  HTN: -Amlodipine, Coreg  GERD: -Protonix   Diarrhrea:  -check c diff pcr   DVT ppx: SQ Heparin  Code Status: Full code Family Communication: None at bed side.   Disposition Plan: Admit to inpatient   Date of Service 06/02/2015    Ivor Costa [facility] Pager [phone]  If 7PM-7AM, please contact night-coverage [email or web address] Password [facility] 06/02/2015, 2:34 AM

## 2015-06-01 NOTE — ED Notes (Signed)
Pt here for ulceration to right top of foot.  Hx of DMII and has had 4 toes removed. She was seen at [facility] last week for an ulceration to the plantar area of her right foot. She was informed to come back to ED if ulcer worsens. Yesterday morning, she noticed a second ulceration to top of her foot where her toes are supposed to be. She states it has been popped and now has scant drainage from wound. Pt reports nausea and vomiting today x 2.

## 2015-06-01 NOTE — ED Provider Notes (Signed)
CSN: [id number]     Arrival date & time 06/01/15  1653 History   First MD Initiated Contact with Patient 06/01/15 2049     Chief Complaint  Patient presents with  . Foot Ulcer    The history is provided by the patient and medical records.    49 yo F with hx DM, ESRD s/p renal transplant 2011 on chronic immunosuppression, HTN, hypothyroid presenting with foot wound. Onset was 3 weeks ago. Location is in right foot near the third toe, which has been amputated previously. Worsening since onset. First found in plantar surface, now has spread and extended through dorsal surface. Described as blistering, swelling, redness. Severe pain, radiating up calf. Associated with leakage of clear fluid from wound. Previously seen a week ago at [facility] where pt was sent home with trial of outpatient antibiotics with wound care RN doing wet to dry dressings at home and f/u in wound clinic in first week of October. However, pt reports trying to take antibiotics but having emesis and diarrhea when taking them so she didn't think they were staying down. Denies fevers.   Past Medical History  Diagnosis Date  . Hypertension   . Hypothyroid   . Depression   . 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 Kidney disease   . End stage renal disease     right arm AV graft, post transplant  . Diabetes mellitus     Insulin dependant   Past Surgical History  Procedure Laterality Date  . Back surgery  2001  . Tubal ligation    . Dg av dialysis graft declot or    . Nephrectomy transplanted organ    . Kidney transplant  December 16, 2009  . 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  . Cataract extraction w/ intraocular lens implant Right   . Amputation Left 05/11/2014    Procedure: AMPUTATION LEFT GREAT TOE;  Surgeon: Kathryne Hitch, MD;  Location: [facility];  Service: Orthopedics;  Laterality: Left;   Family History  Problem Relation Age of Onset  . Emphysema Mother   . Throat cancer Mother   .  COPD Mother   . Cancer Mother   . Emphysema Father   . COPD Father   . Stroke Father   . ADD / ADHD Son    Social History  Substance Use Topics  . Smoking status: Former Smoker -- 1.00 packs/day for 11 years    Types: Cigarettes    Quit date: 09/21/1998  . Smokeless tobacco: Never Used  . Alcohol Use: No   OB History    No data available     Review of Systems  Constitutional: Negative for fever.  HENT: Negative for rhinorrhea.   Eyes: Negative for visual disturbance.  Respiratory: Negative for shortness of breath.   Cardiovascular: Negative for chest pain.  Gastrointestinal: Positive for nausea, vomiting and diarrhea. Negative for abdominal pain.  Genitourinary: Negative for decreased urine volume.  Skin: Positive for color change and wound.  Allergic/Immunologic: Positive for immunocompromised state.  Neurological: Negative for syncope.  Psychiatric/Behavioral: Negative for confusion.    Allergies  Ace inhibitors and Adhesive  Home Medications   Prior to Admission medications   Medication Sig Start Date End Date Taking? Authorizing Provider  acetaminophen (TYLENOL) 500 MG tablet Take 500-1,000 mg by mouth every 6 (six) hours as needed for headache.   Yes Historical Provider, MD  amLODipine (NORVASC) 5 MG tablet Take 1 tablet (5 mg total)  by mouth daily. 03/04/15  Yes Alison Murray, MD  carvedilol (COREG) 25 MG tablet Take 2 tablets (50 mg total) by mouth 2 (two) times daily. 03/04/15  Yes Alison Murray, MD  insulin NPH-regular Human (NOVOLIN 70/30) (70-30) 100 UNIT/ML injection Inject 30 Units into the skin 2 (two) times daily with a meal. 03/04/15  Yes Alison Murray, MD  levothyroxine (SYNTHROID, LEVOTHROID) 137 MCG tablet Take 1 tablet (137 mcg total) by mouth daily. BRAND NAME ONLY Patient taking differently: Take 137 mcg by mouth every evening. BRAND NAME ONLY 03/04/15  Yes Alison Murray, MD  mycophenolate (CELLCEPT) 250 MG capsule Take 500 mg by mouth 2 (two) times  daily.    Yes Historical Provider, MD  orphenadrine (NORFLEX) 100 MG tablet Take 1 tablet (100 mg total) by mouth 2 (two) times daily. 04/21/15  Yes Dione Booze, MD  predniSONE (DELTASONE) 5 MG tablet Take 5 mg by mouth daily. 05/29/15  Yes Historical Provider, MD  ranitidine (ZANTAC) 150 MG capsule Take 150 mg by mouth daily as needed for heartburn.    Yes Historical Provider, MD  tacrolimus (PROGRAF) 1 MG capsule Take 1 mg by mouth 2 (two) times daily.    Yes Historical Provider, MD   BP 138/58 mmHg  Pulse 79  Temp(Src) 97.9 F (36.6 C) (Oral)  Resp 18  Ht  (1.727 m)  Wt 212 lb (96.163 kg)  BMI 32.24 kg/m2  SpO2 96%  LMP 11/19/2011 Physical Exam  Constitutional: She is oriented to person, place, and time. She appears well-developed and well-nourished. No distress.  HENT:  Head: Normocephalic and atraumatic.  Eyes: Right eye exhibits no discharge. Left eye exhibits no discharge.  Neck: No tracheal deviation present.  Cardiovascular: Normal rate and regular rhythm.   Pulmonary/Chest: Effort normal and breath sounds normal. No respiratory distress.  Abdominal: Soft. She exhibits no distension. There is no tenderness.  Musculoskeletal: She exhibits edema and tenderness.  R foot: s/p amputation of first three toes. Base of third toe with ulcerated wound extending from planter through dorsal surface. Exposed red beefy tissue at top of foot. Surrounding erythema and fluctuance. Foot warm and well perfused. TTP extending from wound through midfoot   Neurological: She is alert and oriented to person, place, and time.  Skin: Skin is warm and dry.  Psychiatric: She has a normal mood and affect. Her behavior is normal.    ED Course  Procedures (including critical care time) Labs Review Labs Reviewed  I-STAT CHEM 8, ED - Abnormal; Notable for the following:    BUN 36 (*)    Creatinine, Ser 1.40 (*)    Glucose, Bld 115 (*)    Calcium, Ion 1.30 (*)    All other components within normal  limits  CULTURE, BLOOD (SINGLE)  CULTURE, BLOOD (SINGLE)  WOUND CULTURE  CULTURE, BLOOD (ROUTINE X 2)  CULTURE, BLOOD (ROUTINE X 2)  CBC WITH DIFFERENTIAL/PLATELET  PROTIME-INR  APTT  TACROLIMUS LEVEL  HCG, QUANTITATIVE, PREGNANCY  CORTISOL-AM, BLOOD  COMPREHENSIVE METABOLIC PANEL  CBC  LACTIC ACID, PLASMA  LACTIC ACID, PLASMA  PROCALCITONIN  TYPE AND SCREEN    Imaging Review Dg Foot Complete Right  06/01/2015   CLINICAL DATA:  Soft tissue infection in the distal right foot  EXAM: RIGHT FOOT COMPLETE - 3+ VIEW  COMPARISON:  05/23/2015  FINDINGS: There again noted changes consistent with amputation of the first through third digits. Fractures noted in the proximal fifth metatarsal. Some generalized soft tissue swelling  is noted distally. No areas of bony erosion to suggest osteomyelitis are seen.  IMPRESSION: Soft tissue changes without acute osteomyelitis.   Electronically Signed   By: Alcide Clever M.D.   On: 06/01/2015 21:57   I have personally reviewed and evaluated these images and lab results as part of my medical decision-making.   EKG Interpretation None      MDM   Final diagnoses:  Diabetic foot infection  Immunosuppressed status     49 yo F with hx DM, ESRD s/p renal transplant 2011 on chronic immunosuppression, HTN, hypothyroid presenting with foot wound. S/p trial of outpatient therapy with antibiotics, wound care. Worsening of wound despite therapy. Xray without evidence of osteomyelitis. Given immunosuppressed state and failed outpatient management, plan to admit for inpatient care. Cultures drawn. Antibiotics started. Admitted in stable condition.  Case discussed with Dr. Denton Lank who oversaw management of this patient.     Urban Gibson, MD 06/01/15 2354  Cathren Laine, MD 06/02/15 2132

## 2015-06-01 NOTE — Progress Notes (Signed)
ANTIBIOTIC CONSULT NOTE - INITIAL  Pharmacy Consult for vancomycin Indication: diabetic foot infection  Allergies  Allergen Reactions  . Ace Inhibitors Other (See Comments)    Cough   ( Lisinopril )  . Adhesive [Tape] Itching    Patient Measurements: Height:  (172.7 cm) Weight: 212 lb (96.163 kg) IBW/kg (Calculated) : 63.9  Vital Signs: Temp: 97.9 F (36.6 C) (09/24 1706) Temp Source: Oral (09/24 1706) BP: 138/58 mmHg (09/24 2215) Pulse Rate: 79 (09/24 2300)  Labs:  Recent Labs  06/01/15 1710 06/01/15 1721  WBC 6.7  --   HGB 12.3 13.9  PLT 336  --   CREATININE  --  1.40*   Estimated Creatinine Clearance: 59.6 mL/min (by C-G formula based on Cr of 1.4).   Medical History: Past Medical History  Diagnosis Date  . Hypertension   . Hypothyroid   . Depression   . 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 Kidney disease   . End stage renal disease     right arm AV graft, post transplant  . Diabetes mellitus     Insulin dependant    Assessment: 49yo female c/o worsening/spreading ulceration of foot w/ drainage, to begin IV ABX.  Goal of Therapy:  Vancomycin trough level 10-15 mcg/ml  Plan:  Receiving vanc 1g in ED; will continue with vancomycin  IV Q24H and monitor CBC, Cx, levels prn.  Vernard Gambles, PharmD, BCPS  06/01/2015,11:14 PM

## 2015-06-02 ENCOUNTER — Inpatient Hospital Stay (HOSPITAL_COMMUNITY): Payer: Medicare Other | Admitting: Anesthesiology

## 2015-06-02 ENCOUNTER — Encounter (HOSPITAL_COMMUNITY)
Admission: EM | Disposition: A | Discharge status: Home Health | Payer: Self-pay | Source: Home / Self Care | Attending: Internal Medicine

## 2015-06-02 ENCOUNTER — Inpatient Hospital Stay (HOSPITAL_COMMUNITY): Payer: Medicare Other

## 2015-06-02 ENCOUNTER — Encounter (HOSPITAL_COMMUNITY): Payer: Self-pay | Admitting: Certified Registered"

## 2015-06-02 DIAGNOSIS — Z940 Kidney transplant status: Secondary | ICD-10-CM

## 2015-06-02 DIAGNOSIS — N183 Chronic kidney disease, stage 3 unspecified: Secondary | ICD-10-CM | POA: Diagnosis present

## 2015-06-02 DIAGNOSIS — R197 Diarrhea, unspecified: Secondary | ICD-10-CM | POA: Diagnosis present

## 2015-06-02 DIAGNOSIS — E1065 Type 1 diabetes mellitus with hyperglycemia: Secondary | ICD-10-CM

## 2015-06-02 DIAGNOSIS — E1110 Type 2 diabetes mellitus with ketoacidosis without coma: Secondary | ICD-10-CM | POA: Diagnosis present

## 2015-06-02 DIAGNOSIS — N179 Acute kidney failure, unspecified: Secondary | ICD-10-CM

## 2015-06-02 DIAGNOSIS — M869 Osteomyelitis, unspecified: Secondary | ICD-10-CM

## 2015-06-02 DIAGNOSIS — L97509 Non-pressure chronic ulcer of other part of unspecified foot with unspecified severity: Secondary | ICD-10-CM

## 2015-06-02 DIAGNOSIS — E10621 Type 1 diabetes mellitus with foot ulcer: Secondary | ICD-10-CM

## 2015-06-02 HISTORY — PX: INCISION AND DRAINAGE: SHX5863

## 2015-06-02 LAB — SURGICAL PCR SCREEN
MRSA, PCR: NEGATIVE
Staphylococcus aureus: POSITIVE — AB

## 2015-06-02 LAB — PROTIME-INR
INR: 1.17 (ref 0.00–1.49)
Prothrombin Time: 15.1 seconds (ref 11.6–15.2)

## 2015-06-02 LAB — COMPREHENSIVE METABOLIC PANEL
ALBUMIN: 2.8 g/dL — AB (ref 3.5–5.0)
ALK PHOS: 70 U/L (ref 38–126)
ALT: 15 U/L (ref 14–54)
AST: 20 U/L (ref 15–41)
Anion gap: 12 (ref 5–15)
BUN: 35 mg/dL — ABNORMAL HIGH (ref 6–20)
CALCIUM: 9.8 mg/dL (ref 8.9–10.3)
CO2: 21 mmol/L — AB (ref 22–32)
CREATININE: 2.1 mg/dL — AB (ref 0.44–1.00)
Chloride: 103 mmol/L (ref 101–111)
GFR calc Af Amer: 31 mL/min — ABNORMAL LOW (ref >60)
GFR calc non Af Amer: 27 mL/min — ABNORMAL LOW (ref >60)
GLUCOSE: 244 mg/dL — AB (ref 65–99)
Potassium: 4.7 mmol/L (ref 3.5–5.1)
SODIUM: 136 mmol/L (ref 135–145)
Total Bilirubin: 1.1 mg/dL (ref 0.3–1.2)
Total Protein: 6.3 g/dL — ABNORMAL LOW (ref 6.5–8.1)

## 2015-06-02 LAB — CBC
HCT: 39.2 % (ref 36.0–46.0)
Hemoglobin: 12.5 g/dL (ref 12.0–15.0)
MCH: 29.2 pg (ref 26.0–34.0)
MCHC: 31.9 g/dL (ref 30.0–36.0)
MCV: 91.6 fL (ref 78.0–100.0)
PLATELETS: 275 10*3/uL (ref 150–400)
RBC: 4.28 MIL/uL (ref 3.87–5.11)
RDW: 13.3 % (ref 11.5–15.5)
WBC: 11.7 10*3/uL — ABNORMAL HIGH (ref 4.0–10.5)

## 2015-06-02 LAB — GLUCOSE, CAPILLARY
GLUCOSE-CAPILLARY: 142 mg/dL — AB (ref 65–99)
GLUCOSE-CAPILLARY: 206 mg/dL — AB (ref 65–99)
Glucose-Capillary: 273 mg/dL — ABNORMAL HIGH (ref 65–99)
Glucose-Capillary: 261 mg/dL — ABNORMAL HIGH (ref 65–99)
Glucose-Capillary: 166 mg/dL — ABNORMAL HIGH (ref 65–99)

## 2015-06-02 LAB — TYPE AND SCREEN
ABO/RH(D): O POS
Antibody Screen: NEGATIVE

## 2015-06-02 LAB — C-REACTIVE PROTEIN: CRP: 2.5 mg/dL — ABNORMAL HIGH (ref <1.0)

## 2015-06-02 LAB — LACTIC ACID, PLASMA
LACTIC ACID, VENOUS: 2 mmol/L (ref 0.5–2.0)
Lactic Acid, Venous: 1.3 mmol/L (ref 0.5–2.0)

## 2015-06-02 LAB — HCG, QUANTITATIVE, PREGNANCY: HCG, BETA CHAIN, QUANT, S: 10 m[IU]/mL — AB (ref <5)

## 2015-06-02 LAB — CORTISOL-AM, BLOOD: CORTISOL - AM: 44.1 ug/dL — AB (ref 6.7–22.6)

## 2015-06-02 LAB — APTT: aPTT: 29 seconds (ref 24–37)

## 2015-06-02 LAB — PROCALCITONIN: Procalcitonin: 0.16 ng/mL

## 2015-06-02 LAB — C DIFFICILE QUICK SCREEN W PCR REFLEX
C DIFFICILE (CDIFF) TOXIN: NEGATIVE
C DIFFICLE (CDIFF) ANTIGEN: NEGATIVE
C Diff interpretation: NEGATIVE

## 2015-06-02 LAB — SEDIMENTATION RATE: SED RATE: 36 mm/h — AB (ref 0–22)

## 2015-06-02 LAB — MRSA PCR SCREENING: MRSA by PCR: POSITIVE — AB

## 2015-06-02 LAB — ABO/RH: ABO/RH(D): O POS

## 2015-06-02 SURGERY — INCISION AND DRAINAGE
Anesthesia: General | Site: Foot | Laterality: Right

## 2015-06-02 MED ORDER — MUPIROCIN 2 % EX OINT
TOPICAL_OINTMENT | Freq: Two times a day (BID) | CUTANEOUS | Status: DC
  Administered 2015-06-02 – 2015-06-06 (×9): via NASAL
  Filled 2015-06-02 (×4): qty 22

## 2015-06-02 MED ORDER — OXYCODONE HCL 5 MG PO TABS
5.0000 mg | ORAL_TABLET | ORAL | Status: DC | PRN
  Administered 2015-06-03: 10 mg via ORAL
  Filled 2015-06-02: qty 2

## 2015-06-02 MED ORDER — OXYCODONE HCL 5 MG/5ML PO SOLN: 5.0000 mg | Freq: Once | ORAL | Status: DC | PRN

## 2015-06-02 MED ORDER — OXYCODONE HCL 5 MG PO TABS: 5.0000 mg | ORAL_TABLET | Freq: Once | ORAL | Status: DC | PRN

## 2015-06-02 MED ORDER — HYDROMORPHONE HCL 1 MG/ML IJ SOLN
0.2500 mg | INTRAMUSCULAR | Status: DC | PRN
  Administered 2015-06-02 (×2): 0.25 mg via INTRAVENOUS

## 2015-06-02 MED ORDER — MIDAZOLAM HCL 2 MG/2ML IJ SOLN
INTRAMUSCULAR | Status: AC
  Filled 2015-06-02: qty 4

## 2015-06-02 MED ORDER — ONDANSETRON HCL 4 MG/2ML IJ SOLN
4.0000 mg | Freq: Four times a day (QID) | INTRAMUSCULAR | Status: DC | PRN
  Administered 2015-06-02 – 2015-06-03 (×2): 4 mg via INTRAVENOUS
  Filled 2015-06-02 (×3): qty 2

## 2015-06-02 MED ORDER — HYDROMORPHONE HCL 1 MG/ML IJ SOLN
INTRAMUSCULAR | Status: AC
  Filled 2015-06-02: qty 1

## 2015-06-02 MED ORDER — ONDANSETRON HCL 4 MG/2ML IJ SOLN
4.0000 mg | Freq: Four times a day (QID) | INTRAMUSCULAR | Status: AC | PRN
  Administered 2015-06-03: 4 mg via INTRAVENOUS

## 2015-06-02 MED ORDER — HYDROMORPHONE HCL 1 MG/ML IJ SOLN
1.0000 mg | INTRAMUSCULAR | Status: DC | PRN
  Administered 2015-06-03 – 2015-06-04 (×2): 1 mg via INTRAVENOUS
  Filled 2015-06-02 (×2): qty 1

## 2015-06-02 MED ORDER — LIDOCAINE HCL (CARDIAC) 20 MG/ML IV SOLN
INTRAVENOUS | Status: AC
  Filled 2015-06-02: qty 5

## 2015-06-02 MED ORDER — INFLUENZA VAC SPLIT QUAD 0.5 ML IM SUSY
0.5000 mL | PREFILLED_SYRINGE | INTRAMUSCULAR | Status: AC
  Administered 2015-06-03: 0.5 mL via INTRAMUSCULAR
  Filled 2015-06-02: qty 0.5

## 2015-06-02 MED ORDER — ONDANSETRON HCL 4 MG/2ML IJ SOLN
INTRAMUSCULAR | Status: DC | PRN
  Administered 2015-06-02: 4 mg via INTRAVENOUS

## 2015-06-02 MED ORDER — PROPOFOL 10 MG/ML IV BOLUS
INTRAVENOUS | Status: DC | PRN
  Administered 2015-06-02: 160 mg via INTRAVENOUS

## 2015-06-02 MED ORDER — SODIUM CHLORIDE 0.9 % IR SOLN
Status: DC | PRN
  Administered 2015-06-02: 1000 mL

## 2015-06-02 MED ORDER — PROPOFOL 10 MG/ML IV BOLUS
INTRAVENOUS | Status: AC
  Filled 2015-06-02: qty 20

## 2015-06-02 MED ORDER — FENTANYL CITRATE (PF) 250 MCG/5ML IJ SOLN
INTRAMUSCULAR | Status: AC
  Filled 2015-06-02: qty 5

## 2015-06-02 MED ORDER — CHLORHEXIDINE GLUCONATE CLOTH 2 % EX PADS
6.0000 | MEDICATED_PAD | Freq: Every day | CUTANEOUS | Status: AC
  Administered 2015-06-02 – 2015-06-06 (×6): 6 via TOPICAL

## 2015-06-02 MED ORDER — LIDOCAINE HCL (CARDIAC) 20 MG/ML IV SOLN
INTRAVENOUS | Status: DC | PRN
  Administered 2015-06-02: 80 mg via INTRAVENOUS

## 2015-06-02 MED ORDER — PREDNISONE 5 MG PO TABS
5.0000 mg | ORAL_TABLET | Freq: Every day | ORAL | Status: DC
  Administered 2015-06-02 – 2015-06-06 (×5): 5 mg via ORAL
  Filled 2015-06-02 (×5): qty 1

## 2015-06-02 MED ORDER — FENTANYL CITRATE (PF) 100 MCG/2ML IJ SOLN
INTRAMUSCULAR | Status: DC | PRN
Start: 2015-06-02 — End: 2015-06-02
  Administered 2015-06-02: 50 ug via INTRAVENOUS

## 2015-06-02 MED ORDER — PHENYLEPHRINE HCL 10 MG/ML IJ SOLN
INTRAMUSCULAR | Status: DC | PRN
  Administered 2015-06-02: 80 ug via INTRAVENOUS

## 2015-06-02 SURGICAL SUPPLY — 67 items
BANDAGE ELASTIC 3 VELCRO ST LF (GAUZE/BANDAGES/DRESSINGS) IMPLANT
BANDAGE ESMARK 6X9 LF (GAUZE/BANDAGES/DRESSINGS) ×2 IMPLANT
BLADE SURG 10 STRL SS (BLADE) ×4 IMPLANT
BNDG COHESIVE 1X5 TAN STRL LF (GAUZE/BANDAGES/DRESSINGS) IMPLANT
BNDG COHESIVE 4X5 TAN STRL (GAUZE/BANDAGES/DRESSINGS) ×4 IMPLANT
BNDG COHESIVE 6X5 TAN STRL LF (GAUZE/BANDAGES/DRESSINGS) IMPLANT
BNDG CONFORM 3 STRL LF (GAUZE/BANDAGES/DRESSINGS) IMPLANT
BNDG ESMARK 6X9 LF (GAUZE/BANDAGES/DRESSINGS) ×4
BNDG GAUZE ELAST 4 BULKY (GAUZE/BANDAGES/DRESSINGS) ×4 IMPLANT
BNDG GAUZE STRTCH 6 (GAUZE/BANDAGES/DRESSINGS) IMPLANT
CORDS BIPOLAR (ELECTRODE) IMPLANT
COVER SURGICAL LIGHT HANDLE (MISCELLANEOUS) ×4 IMPLANT
CUFF TOURNIQUET SINGLE 18IN (TOURNIQUET CUFF) ×4 IMPLANT
CUFF TOURNIQUET SINGLE 24IN (TOURNIQUET CUFF) IMPLANT
CUFF TOURNIQUET SINGLE 34IN LL (TOURNIQUET CUFF) IMPLANT
CUFF TOURNIQUET SINGLE 44IN (TOURNIQUET CUFF) IMPLANT
DRAPE ORTHO SPLIT 77X108 STRL (DRAPES)
DRAPE SURG 17X23 STRL (DRAPES) IMPLANT
DRAPE SURG ORHT 6 SPLT 77X108 (DRAPES) IMPLANT
DRAPE U-SHAPE 47X51 STRL (DRAPES) ×4 IMPLANT
DURAPREP 26ML APPLICATOR (WOUND CARE) IMPLANT
ELECT CAUTERY BLADE 6.4 (BLADE) ×4 IMPLANT
ELECT REM PT RETURN 9FT ADLT (ELECTROSURGICAL) ×4
ELECTRODE REM PT RTRN 9FT ADLT (ELECTROSURGICAL) ×2 IMPLANT
GAUZE SPONGE 4X4 12PLY STRL (GAUZE/BANDAGES/DRESSINGS) ×4 IMPLANT
GAUZE XEROFORM 1X8 LF (GAUZE/BANDAGES/DRESSINGS) IMPLANT
GAUZE XEROFORM 5X9 LF (GAUZE/BANDAGES/DRESSINGS) ×4 IMPLANT
GLOVE BIO SURGEON STRL SZ7.5 (GLOVE) ×8 IMPLANT
GLOVE BIO SURGEON STRL SZ8 (GLOVE) IMPLANT
GLOVE BIOGEL PI IND STRL 7.0 (GLOVE) ×4 IMPLANT
GLOVE BIOGEL PI IND STRL 7.5 (GLOVE) ×2 IMPLANT
GLOVE BIOGEL PI IND STRL 8 (GLOVE) ×2 IMPLANT
GLOVE BIOGEL PI INDICATOR 7.0 (GLOVE) ×4
GLOVE BIOGEL PI INDICATOR 7.5 (GLOVE) ×2
GLOVE BIOGEL PI INDICATOR 8 (GLOVE) ×2
GLOVE ORTHO TXT STRL SZ7.5 (GLOVE) ×4 IMPLANT
GLOVE SURG SS PI 6.5 STRL IVOR (GLOVE) ×8 IMPLANT
GOWN STRL REUS W/ TWL LRG LVL3 (GOWN DISPOSABLE) ×4 IMPLANT
GOWN STRL REUS W/ TWL XL LVL3 (GOWN DISPOSABLE) ×4 IMPLANT
GOWN STRL REUS W/TWL LRG LVL3 (GOWN DISPOSABLE) ×4
GOWN STRL REUS W/TWL XL LVL3 (GOWN DISPOSABLE) ×4
HANDPIECE INTERPULSE COAX TIP (DISPOSABLE)
KIT BASIN OR (CUSTOM PROCEDURE TRAY) ×8 IMPLANT
KIT ROOM TURNOVER OR (KITS) ×4 IMPLANT
MANIFOLD NEPTUNE II (INSTRUMENTS) ×4 IMPLANT
NS IRRIG 1000ML POUR BTL (IV SOLUTION) ×4 IMPLANT
PACK ORTHO EXTREMITY (CUSTOM PROCEDURE TRAY) ×4 IMPLANT
PAD ARMBOARD 7.5X6 YLW CONV (MISCELLANEOUS) ×8 IMPLANT
PADDING CAST ABS 4INX4YD NS (CAST SUPPLIES)
PADDING CAST ABS COTTON 4X4 ST (CAST SUPPLIES) IMPLANT
PADDING CAST COTTON 6X4 STRL (CAST SUPPLIES) IMPLANT
SET HNDPC FAN SPRY TIP SCT (DISPOSABLE) IMPLANT
SPONGE GAUZE 4X4 12PLY STER LF (GAUZE/BANDAGES/DRESSINGS) ×4 IMPLANT
SPONGE LAP 18X18 X RAY DECT (DISPOSABLE) ×4 IMPLANT
STOCKINETTE IMPERVIOUS 9X36 MD (GAUZE/BANDAGES/DRESSINGS) ×4 IMPLANT
SUT ETHILON 2 0 FS 18 (SUTURE) ×8 IMPLANT
SUT ETHILON 3 0 PS 1 (SUTURE) ×8 IMPLANT
SYR CONTROL 10ML LL (SYRINGE) IMPLANT
TOWEL OR 17X24 6PK STRL BLUE (TOWEL DISPOSABLE) ×12 IMPLANT
TOWEL OR 17X26 10 PK STRL BLUE (TOWEL DISPOSABLE) IMPLANT
TUBE ANAEROBIC SPECIMEN COL (MISCELLANEOUS) IMPLANT
TUBE CONNECTING 12'X1/4 (SUCTIONS) ×1
TUBE CONNECTING 12X1/4 (SUCTIONS) ×3 IMPLANT
TUBE FEEDING 5FR 15 INCH (TUBING) IMPLANT
UNDERPAD 30X30 INCONTINENT (UNDERPADS AND DIAPERS) ×4 IMPLANT
WATER STERILE IRR 1000ML POUR (IV SOLUTION) IMPLANT
YANKAUER SUCT BULB TIP NO VENT (SUCTIONS) ×4 IMPLANT

## 2015-06-02 NOTE — Progress Notes (Signed)
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 She had a plantar ulcer for ?almost 3 months but for the last 10-14 days has been painful and red. She went [facility] ED on 9/15 and was given oral clindamycin without event for home health nurse and recommended for one care clinic follow-up. She didn't noticed another ulcer at the base of the right third toe yesterday. Also noted some swelling of her right foot. She had nausea with 2 episodes of vomiting but no fevers or chills. She also had several episodes of diarrhea on the day of admission. In the ED patient was afebrile with normal WBC. X-ray of the foot was negative for osteomyelitis. Patient admitted with concern for right foot ulcer and osteomyelitis with failed outpatient therapy. MRI of the foot done showing osteomyelitis of the distal third metatarsal and remnant of the proximal phalanx of the third toe with possible abscess and osteomyelitis of distal proximal phalanx of fifth toe.   Assessment/Plan: Acute osteomyelitis of the right third and fifth toes with foot ulcer -Possibly contributed by uncontrolled diabetes. Continue empiric vancomycin and Zosyn. Follow blood cultures. Continue pain control. Wound care consulted. Patient has had 2 amputations done and followed by Dr. [name] in the past. I have consulted him and will see her today. Keep nothing by mouth for possible surgery today.  Uncontrolled type 1 diabetes mellitus A1c of 11.6 in June. Very poorly controlled with multiple hospitalization for  DKA (5 hospitalizations and past 1 year). Patient reports taking NPH 70/30, 30 units twice a day which she buys at [place]. She reports she does not she a PCP or an endocrinologist for her diabetes. Her PCP is listed as Dr.colodonato who possibly sees her only for her CKD. On reviewing prior hospitalizations there were discussions about sending her to [facility] but that never happened. -Patient reports that her blood glucose are usually in the teens of 300-500. -We'll consult diabetic coordinator. Patient must be assigned to PCP  in the community prior to discharge and possibly see an endocrinologist as well. -Currently nothing by mouth. Continue NPH 25 U  twice a day with sliding scale coverage.  History of renal transplant with acute on chronic disease stage III Kidney disease secondary to dehydration and infection. Monitor with IV fluids. Avoid nephrotoxins. Resume Prograf, CellCept and prednisone. Check Prograf level. Consult renal as needed.  Hypothyroidism Continue Synthroid  Hypertension, essential Continue Coreg and amlodipine  GERD Continue Pepcid  Diarrhea Possibly associated with antibiotics. Now resolved. C. difficile negative  Diet: Nothing by mouth DVT lysis: Subcutaneous heparin   Code Status: Full code Family Communication: None at bedside Disposition Plan: Continue inpatient monitoring.   Consultants:  Dr. [name] (orthopedics)  Procedures:  MRI right foot  Antibiotics: IV vancomycin and Zosyn 9/24  HPI/Subjective: Patient seen and examined. Convince of pain in her right foot. Admission H&P reviewed. Discussed MRI findings.  Objective: Filed Vitals:   06/02/15 0516  BP: 90/43  Pulse: 97  Temp:   Resp:     Intake/Output Summary (Last 24 hours) at 06/02/15 1224 Last data filed at 06/02/15 [phone]  Gross per 24 hour  Intake 473.33 ml  Output      0 ml  Net 473.33 ml   Filed Weights   06/01/15 1706 06/02/15 0000  Weight: 96.163  kg (212 lb) 96.7 kg (213 lb 3 oz)    Exam:   General:  Middle-aged female in no acute distress  HEENT: No pallor, moist oral mucosa, supple neck  Chest: Clear to auscultation bilaterally  CVS: Normal S1 and S2, no murmurs rub or gallop  Chest: Soft, nondistended, nontender, pulses present  Musculoskeletal: No edema, multiple amputations of bilateral feet. Ulceration Measuring 2X2 centimeter over amputated right third toe with tenderness to exam, right plantar ulcer measuring 2.5X 2.5 cm. distal pulses palpable.  CNS: Alert and oriented  Data Reviewed: Basic Metabolic Panel:  Recent Labs Lab 06/01/15 1721 06/02/15 0645  NA 140 136  K 4.7 4.7  CL 105 103  CO2  --  21*  GLUCOSE 115* 244*  BUN 36* 35*  CREATININE 1.40* 2.10*  CALCIUM  --  9.8   Liver Function Tests:  Recent Labs Lab 06/02/15 0645  AST 20  ALT 15  ALKPHOS 70  BILITOT 1.1  PROT 6.3*  ALBUMIN 2.8*   No results for input(s): LIPASE, AMYLASE in the last 168 hours. No results for input(s): AMMONIA in the last 168 hours. CBC:  Recent Labs Lab 06/01/15 1710 06/01/15 1721 06/02/15 0645  WBC 6.7  --  11.7*  NEUTROABS 4.5  --   --   HGB 12.3 13.9 12.5  HCT 38.8 41.0 39.2  MCV 90.0  --  91.6  PLT 336  --  275   Cardiac Enzymes: No results for input(s): CKTOTAL, CKMB, CKMBINDEX, TROPONINI in the last 168 hours. BNP (last 3 results) No results for input(s): BNP in the last 8760 hours.  ProBNP (last 3 results) No results for input(s): PROBNP in the last 8760 hours.  CBG:  Recent Labs Lab 06/02/15 0101 06/02/15 0743 06/02/15 1145  GLUCAP 142* 273* 261*    Recent Results (from the past 240 hour(s))  Culture, blood (single)     Status: None (Preliminary result)   Collection Time: 06/01/15 10:37 PM  Result Value Ref Range Status   Specimen Description BLOOD RIGHT FOREARM  Final   Special Requests BOTTLES DRAWN AEROBIC AND ANAEROBIC 5CC  Final   Culture NO GROWTH < 12 HOURS  Final    Report Status PENDING  Incomplete  MRSA PCR Screening     Status: Abnormal   Collection Time: 06/02/15 12:10 AM  Result Value Ref Range Status   MRSA by PCR POSITIVE (A) NEGATIVE Final    Comment:        The GeneXpert MRSA Assay (FDA approved for NASAL specimens only), is one component of a comprehensive MRSA colonization surveillance program. It is not intended to diagnose MRSA infection nor to guide or monitor treatment for MRSA infections. RESULT CALLED TO, READ BACK BY AND VERIFIED WITH: M [email or web address]  06/02/15 MKELLY   C difficile quick scan w PCR reflex     Status: None   Collection Time: 06/02/15  1:00 AM  Result Value Ref Range Status   C Diff antigen NEGATIVE NEGATIVE Final   C Diff toxin NEGATIVE NEGATIVE Final   C Diff interpretation Negative for toxigenic C. difficile  Final     Studies: Mr Foot Right Wo Contrast  06/02/2015   CLINICAL DATA:  Diabetic patient with an ulceration of the distal right third ray which developed 3 days  ago. A second ulcer on the right foot developed yesterday. History of prior amputation.  EXAM: MRI OF THE RIGHT FOREFOOT WITHOUT CONTRAST  TECHNIQUE: Multiplanar, multisequence MR imaging was performed. No intravenous contrast was administered.  COMPARISON:  Plain films of the right foot 06/01/2015.  FINDINGS: As seen on the comparison plain films, the patient is status post amputation of the second ray at the level of the distal diaphysis. There is also amputation of the third toe at the level of the base of the proximal phalanx. There is edema in the remnant of the proximal phalanx of the third toe and in the distal 1.5 cm of the third metatarsal compatible with osteomyelitis. There appears to be a skin ulceration over the dorsal aspect of the third toe remnant. Although lack of IV contrast somewhat limits evaluation for abscess, there appears to be focal fluid measuring 1.6 cm transverse by 1.0 cm craniocaudal by 1.1 cm long at the site of the  ulceration worrisome for abscess.  There is marrow edema in the distal 1 cm of the proximal phalanx of the little toe with corresponding decreased T1 signal worrisome for osteomyelitis. No associated abscess is identified.  Degenerative early neuropathic change is seen about the midfoot most notable at the second and third tarsometatarsal joints where there is subchondral cyst formation. Bone marrow signal is otherwise unremarkable. No mass is identified. Intrinsic musculature the foot is markedly atrophied.  IMPRESSION: Findings consistent with osteomyelitis in the distal 1.5 cm of the third metatarsal and remnant of the proximal phalanx of the third toe. There may be an associated abscess at the site of a skin ulceration on the dorsal aspect of the third toe remnant.  Marrow edema in the distal 1 cm of the proximal phalanx of the fifth toe worrisome for osteomyelitis. No associated abscess is identified.  Degenerative versus early neuropathic change midfoot.   Electronically Signed   By: Drusilla Kanner M.D.   On: 06/02/2015 12:15   Dg Foot Complete Right  06/01/2015   CLINICAL DATA:  Soft tissue infection in the distal right foot  EXAM: RIGHT FOOT COMPLETE - 3+ VIEW  COMPARISON:  05/23/2015  FINDINGS: There again noted changes consistent with amputation of the first through third digits. Fractures noted in the proximal fifth metatarsal. Some generalized soft tissue swelling is noted distally. No areas of bony erosion to suggest osteomyelitis are seen.  IMPRESSION: Soft tissue changes without acute osteomyelitis.   Electronically Signed   By: Alcide Clever M.D.   On: 06/01/2015 21:57    Scheduled Meds: . amLODipine  5 mg Oral Daily  . carvedilol  50 mg Oral BID WC  . Chlorhexidine Gluconate Cloth  6 each Topical Q0600  . famotidine  20 mg Oral Daily  . heparin  5,000 Units Subcutaneous 3 times per day  . [START ON 06/03/2015] Influenza vac split quadrivalent PF  0.5 mL Intramuscular Tomorrow-1000  .  insulin aspart  0-9 Units Subcutaneous TID WC  . insulin aspart protamine- aspart  25 Units Subcutaneous BID WC  . levothyroxine  137 mcg Oral QPM  . mupirocin ointment   Nasal BID  . mycophenolate  500 mg Oral BID  . orphenadrine  100 mg Oral BID  . piperacillin-tazobactam (ZOSYN)  IV  3.375 g Intravenous Q8H  . predniSONE  5 mg Oral Daily  . sodium chloride  3 mL Intravenous Q12H  . tacrolimus  1 mg Oral BID  . vancomycin  1,250 mg Intravenous Q24H  Continuous Infusions: . sodium chloride 100 mL/hr at 06/02/15 0103      Time spent: 35 minutes    Remmington Teters  [facility] Pager [phone]. If 7PM-7AM, please contact night-coverage at [email or web address], password [facility] 06/02/2015, 12:24 PM  LOS: 1 day

## 2015-06-02 NOTE — Consult Note (Signed)
  Ms. Behe is well-known to me.  She presented to the ED last evening with a right foot wound and a MRI today shows obvious osteo.  I have spoken to her in length and she understands fully that this requires surgery.

## 2015-06-02 NOTE — Brief Op Note (Signed)
06/01/2015 - 06/02/2015  8:35 PM  PATIENT:  [name]  49 y.o. female  PRE-OPERATIVE DIAGNOSIS:  infected right foot  POST-OPERATIVE DIAGNOSIS:  infected right foot  PROCEDURE:  Procedure(s): INCISION AND DRAINAGE OF RIGHT 3rd RAY RESECTION (Right)  SURGEON:  Surgeon(s) and Role:    * Kathryne Hitch, MD - Primary  ANESTHESIA:   general  EBL:  Total I/O In: 200 [I.V.:200] Out: -   BLOOD ADMINISTERED:none  DRAINS: none   LOCAL MEDICATIONS USED:  NONE  SPECIMEN:  No Specimen  DISPOSITION OF SPECIMEN:  N/A  COUNTS:  YES  TOURNIQUET:    DICTATION: .Other Dictation: Dictation Number [id number]  PLAN OF CARE: Admit to inpatient   PATIENT DISPOSITION:  PACU - hemodynamically stable.   Delay start of Pharmacological VTE agent (>24hrs) due to surgical blood loss or risk of bleeding: not applicable

## 2015-06-02 NOTE — Progress Notes (Signed)
PT Cancellation Note  Patient Details Name: [name] MRN: [id number] DOB: May 02, 1966   Cancelled Treatment:    Reason Eval/Treat Not Completed: Medical issues which prohibited therapy: note plan for I&D with finding of osteomyelitis.  Will sign off case and await post-op order with activity/weight bearing precautions as appropriate.  Thank you,    Dennis Bast 06/02/2015, 1:40 PM

## 2015-06-02 NOTE — Anesthesia Procedure Notes (Signed)
Procedure Name: LMA Insertion Date/Time: 06/02/2015 7:59 PM Performed by: Arlice Colt B Pre-anesthesia Checklist: Patient identified, Emergency Drugs available, Suction available, Patient being monitored and Timeout performed Patient Re-evaluated:Patient Re-evaluated prior to inductionOxygen Delivery Method: Circle system utilized Preoxygenation: Pre-oxygenation with 100% oxygen Intubation Type: IV induction LMA: LMA inserted LMA Size: 4.0 Number of attempts: 1 Placement Confirmation: positive ETCO2 and breath sounds checked- equal and bilateral Tube secured with: Tape Dental Injury: Teeth and Oropharynx as per pre-operative assessment

## 2015-06-02 NOTE — Consult Note (Signed)
Reason for Consult:  Right foot wound with osteomyelitis Referring Physician:   [facility]  [name] is an 49 y.o. female.  HPI:   49 yo diabetic female with poor diabetic control and the history of right foot wounds and previous 1-3 toe amputations.  Has recently developed a new wound with drainage on that same right foot.  A MRI shows osteo in the 3rd metatarsal.  She reports significant right foot pain.  Ortho is consulted for further evaluation and treatment.  Past Medical History  Diagnosis Date  . Hypertension   . Hypothyroid   . Depression   . 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 Kidney disease   . End stage renal disease     right arm AV graft, post transplant  . Diabetes mellitus     Insulin dependant    Past Surgical History  Procedure Laterality Date  . Back surgery  2001  . Tubal ligation    . Dg av dialysis graft declot or    . Nephrectomy transplanted organ    . Kidney transplant  December 16, 2009  . 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  . Cataract extraction w/ intraocular lens implant Right   . Amputation Left 05/11/2014    Procedure: AMPUTATION LEFT GREAT TOE;  Surgeon: Mcarthur Rossetti, MD;  Location: [facility];  Service: Orthopedics;  Laterality: Left;    Family History  Problem Relation Age of Onset  . Emphysema Mother   . Throat cancer Mother   . COPD Mother   . Cancer Mother   . Emphysema Father   . COPD Father   . Stroke Father   . ADD / ADHD Son     Social History:  reports that she quit smoking about 16 years ago. Her smoking use included Cigarettes. She has a 11 pack-year smoking history. She has never used smokeless tobacco. She reports that she does not drink alcohol or use illicit drugs.  Allergies:  Allergies  Allergen Reactions  . Ace Inhibitors Other (See Comments)    Cough   ( Lisinopril )  . Adhesive [Tape] Itching    Medications: I have reviewed the patient's current medications.  Results for  orders placed or performed during the hospital encounter of 06/01/15 (from the past 48 hour(s))  CBC with Differential     Status: None   Collection Time: 06/01/15  5:10 PM  Result Value Ref Range   WBC 6.7 4.0 - 10.5 K/uL   RBC 4.31 3.87 - 5.11 MIL/uL   Hemoglobin 12.3 12.0 - 15.0 g/dL   HCT 38.8 36.0 - 46.0 %   MCV 90.0 78.0 - 100.0 fL   MCH 28.5 26.0 - 34.0 pg   MCHC 31.7 30.0 - 36.0 g/dL   RDW 12.9 11.5 - 15.5 %   Platelets 336 150 - 400 K/uL   Neutrophils Relative % 66 %   Neutro Abs 4.5 1.7 - 7.7 K/uL   Lymphocytes Relative 22 %   Lymphs Abs 1.5 0.7 - 4.0 K/uL   Monocytes Relative 9 %   Monocytes Absolute 0.6 0.1 - 1.0 K/uL   Eosinophils Relative 3 %   Eosinophils Absolute 0.2 0.0 - 0.7 K/uL   Basophils Relative 0 %   Basophils Absolute 0.0 0.0 - 0.1 K/uL  I-Stat Chem 8, ED     Status: Abnormal   Collection Time: 06/01/15  5:21 PM  Result Value Ref Range   Sodium 140 135 -  145 mmol/L   Potassium 4.7 3.5 - 5.1 mmol/L   Chloride 105 101 - 111 mmol/L   BUN 36 (H) 6 - 20 mg/dL   Creatinine, Ser 1.40 (H) 0.44 - 1.00 mg/dL   Glucose, Bld 115 (H) 65 - 99 mg/dL   Calcium, Ion 1.30 (H) 1.12 - 1.23 mmol/L   TCO2 23 0 - 100 mmol/L   Hemoglobin 13.9 12.0 - 15.0 g/dL   HCT 41.0 36.0 - 46.0 %  Culture, blood (single)     Status: None (Preliminary result)   Collection Time: 06/01/15 10:37 PM  Result Value Ref Range   Specimen Description BLOOD RIGHT FOREARM    Special Requests BOTTLES DRAWN AEROBIC AND ANAEROBIC 5CC    Culture NO GROWTH < 12 HOURS    Report Status PENDING   MRSA PCR Screening     Status: Abnormal   Collection Time: 06/02/15 12:10 AM  Result Value Ref Range   MRSA by PCR POSITIVE (A) NEGATIVE    Comment:        The GeneXpert MRSA Assay (FDA approved for NASAL specimens only), is one component of a comprehensive MRSA colonization surveillance program. It is not intended to diagnose MRSA infection nor to guide or monitor treatment for MRSA  infections. RESULT CALLED TO, READ BACK BY AND VERIFIED WITH: M [email or web address]  06/02/15 MKELLY   C difficile quick scan w PCR reflex     Status: None   Collection Time: 06/02/15  1:00 AM  Result Value Ref Range   C Diff antigen NEGATIVE NEGATIVE   C Diff toxin NEGATIVE NEGATIVE   C Diff interpretation Negative for toxigenic C. difficile   Glucose, capillary     Status: Abnormal   Collection Time: 06/02/15  1:01 AM  Result Value Ref Range   Glucose-Capillary 142 (H) 65 - 99 mg/dL  Protime-INR     Status: None   Collection Time: 06/02/15  1:55 AM  Result Value Ref Range   Prothrombin Time 15.1 11.6 - 15.2 seconds   INR 1.17 0.00 - 1.49  APTT     Status: None   Collection Time: 06/02/15  1:55 AM  Result Value Ref Range   aPTT 29 24 - 37 seconds  Type and screen     Status: None   Collection Time: 06/02/15  1:55 AM  Result Value Ref Range   ABO/RH(D) O POS    Antibody Screen NEG    Sample Expiration 06/05/2015   hCG, quantitative, pregnancy     Status: Abnormal   Collection Time: 06/02/15  1:55 AM  Result Value Ref Range   hCG, Beta Chain, Quant, S 10 (H) <5 mIU/mL    Comment:          GEST. AGE      CONC.  (mIU/mL)   <=1 WEEK        5 - 50     2 WEEKS       50 - 500     3 WEEKS       100 - 10,000     4 WEEKS     1,000 - 30,000     5 WEEKS     3,500 - 115,000   6-8 WEEKS     12,000 - 270,000    12 WEEKS     15,000 - 220,000        FEMALE AND NON-PREGNANT FEMALE:     LESS THAN 5 mIU/mL   Lactic acid, plasma  Status: None   Collection Time: 06/02/15  1:55 AM  Result Value Ref Range   Lactic Acid, Venous 2.00 0.5 - 2.0 mmol/L  Procalcitonin     Status: None   Collection Time: 06/02/15  1:55 AM  Result Value Ref Range   Procalcitonin 0.16 ng/mL    Comment:        Interpretation: PCT (Procalcitonin) <= 0.5 ng/mL: Systemic infection (sepsis) is not likely. Local bacterial infection is possible. (NOTE)         ICU PCT Algorithm               Non ICU PCT Algorithm     ----------------------------     ------------------------------         PCT < 0.25 ng/mL                 PCT < 0.1 ng/mL     Stopping of antibiotics            Stopping of antibiotics       strongly encouraged.               strongly encouraged.    ----------------------------     ------------------------------       PCT level decrease by               PCT < 0.25 ng/mL       >= 80% from peak PCT       OR PCT 0.25 - 0.5 ng/mL          Stopping of antibiotics                                             encouraged.     Stopping of antibiotics           encouraged.    ----------------------------     ------------------------------       PCT level decrease by              PCT >= 0.25 ng/mL       < 80% from peak PCT        AND PCT >= 0.5 ng/mL            Continuin g antibiotics                                              encouraged.       Continuing antibiotics            encouraged.    ----------------------------     ------------------------------     PCT level increase compared          PCT > 0.5 ng/mL         with peak PCT AND          PCT >= 0.5 ng/mL             Escalation of antibiotics                                          strongly encouraged.      Escalation of antibiotics        strongly encouraged.  ABO/Rh     Status: None   Collection Time: 06/02/15  1:55 AM  Result Value Ref Range   ABO/RH(D) O POS   Cortisol-am, blood     Status: Abnormal   Collection Time: 06/02/15  6:45 AM  Result Value Ref Range   Cortisol - AM 44.1 (H) 6.7 - 22.6 ug/dL  Comprehensive metabolic panel     Status: Abnormal   Collection Time: 06/02/15  6:45 AM  Result Value Ref Range   Sodium 136 135 - 145 mmol/L   Potassium 4.7 3.5 - 5.1 mmol/L   Chloride 103 101 - 111 mmol/L   CO2 21 (L) 22 - 32 mmol/L   Glucose, Bld 244 (H) 65 - 99 mg/dL   BUN 35 (H) 6 - 20 mg/dL   Creatinine, Ser 2.10 (H) 0.44 - 1.00 mg/dL   Calcium 9.8 8.9 - 10.3 mg/dL   Total Protein 6.3 (L) 6.5 - 8.1 g/dL   Albumin  2.8 (L) 3.5 - 5.0 g/dL   AST 20 15 - 41 U/L   ALT 15 14 - 54 U/L   Alkaline Phosphatase 70 38 - 126 U/L   Total Bilirubin 1.1 0.3 - 1.2 mg/dL   GFR calc non Af Amer 27 (L) >60 mL/min   GFR calc Af Amer 31 (L) >60 mL/min    Comment: (NOTE) The eGFR has been calculated using the CKD EPI equation. This calculation has not been validated in all clinical situations. eGFR's persistently <60 mL/min signify possible Chronic Kidney Disease.    Anion gap 12 5 - 15  CBC     Status: Abnormal   Collection Time: 06/02/15  6:45 AM  Result Value Ref Range   WBC 11.7 (H) 4.0 - 10.5 K/uL   RBC 4.28 3.87 - 5.11 MIL/uL   Hemoglobin 12.5 12.0 - 15.0 g/dL   HCT 39.2 36.0 - 46.0 %   MCV 91.6 78.0 - 100.0 fL   MCH 29.2 26.0 - 34.0 pg   MCHC 31.9 30.0 - 36.0 g/dL   RDW 13.3 11.5 - 15.5 %   Platelets 275 150 - 400 K/uL  Lactic acid, plasma     Status: None   Collection Time: 06/02/15  6:45 AM  Result Value Ref Range   Lactic Acid, Venous 1.3 0.5 - 2.0 mmol/L  Sedimentation rate     Status: Abnormal   Collection Time: 06/02/15  6:45 AM  Result Value Ref Range   Sed Rate 36 (H) 0 - 22 mm/hr  C-reactive protein     Status: Abnormal   Collection Time: 06/02/15  6:45 AM  Result Value Ref Range   CRP 2.5 (H) <1.0 mg/dL  Glucose, capillary     Status: Abnormal   Collection Time: 06/02/15  7:43 AM  Result Value Ref Range   Glucose-Capillary 273 (H) 65 - 99 mg/dL  Glucose, capillary     Status: Abnormal   Collection Time: 06/02/15 11:45 AM  Result Value Ref Range   Glucose-Capillary 261 (H) 65 - 99 mg/dL    Mr Foot Right Wo Contrast  06/02/2015   CLINICAL DATA:  Diabetic patient with an ulceration of the distal right third ray which developed 3 days ago. A second ulcer on the right foot developed yesterday. History of prior amputation.  EXAM: MRI OF THE RIGHT FOREFOOT WITHOUT CONTRAST  TECHNIQUE: Multiplanar, multisequence MR imaging was performed. No intravenous contrast was administered.  COMPARISON:   Plain films of the right foot 06/01/2015.  FINDINGS: As seen  on the comparison plain films, the patient is status post amputation of the second ray at the level of the distal diaphysis. There is also amputation of the third toe at the level of the base of the proximal phalanx. There is edema in the remnant of the proximal phalanx of the third toe and in the distal 1.5 cm of the third metatarsal compatible with osteomyelitis. There appears to be a skin ulceration over the dorsal aspect of the third toe remnant. Although lack of IV contrast somewhat limits evaluation for abscess, there appears to be focal fluid measuring 1.6 cm transverse by 1.0 cm craniocaudal by 1.1 cm long at the site of the ulceration worrisome for abscess.  There is marrow edema in the distal 1 cm of the proximal phalanx of the little toe with corresponding decreased T1 signal worrisome for osteomyelitis. No associated abscess is identified.  Degenerative early neuropathic change is seen about the midfoot most notable at the second and third tarsometatarsal joints where there is subchondral cyst formation. Bone marrow signal is otherwise unremarkable. No mass is identified. Intrinsic musculature the foot is markedly atrophied.  IMPRESSION: Findings consistent with osteomyelitis in the distal 1.5 cm of the third metatarsal and remnant of the proximal phalanx of the third toe. There may be an associated abscess at the site of a skin ulceration on the dorsal aspect of the third toe remnant.  Marrow edema in the distal 1 cm of the proximal phalanx of the fifth toe worrisome for osteomyelitis. No associated abscess is identified.  Degenerative versus early neuropathic change midfoot.   Electronically Signed   By: Inge Rise M.D.   On: 06/02/2015 12:15   Dg Foot Complete Right  06/01/2015   CLINICAL DATA:  Soft tissue infection in the distal right foot  EXAM: RIGHT FOOT COMPLETE - 3+ VIEW  COMPARISON:  05/23/2015  FINDINGS: There again noted  changes consistent with amputation of the first through third digits. Fractures noted in the proximal fifth metatarsal. Some generalized soft tissue swelling is noted distally. No areas of bony erosion to suggest osteomyelitis are seen.  IMPRESSION: Soft tissue changes without acute osteomyelitis.   Electronically Signed   By: Inez Catalina M.D.   On: 06/01/2015 21:57    ROS Blood pressure 99/45, pulse 90, temperature 99.1 F (37.3 C), temperature source Oral, resp. rate 19, height _0  (1.727 m), weight 96.7 kg (213 lb 3 oz), last menstrual period 11/19/2011, SpO2 98 %. Physical Exam  Constitutional: She is oriented to person, place, and time. She appears well-developed and well-nourished.  HENT:  Head: Normocephalic and atraumatic.  Eyes: EOM are normal. Pupils are equal, round, and reactive to light.  Neck: Normal range of motion. Neck supple.  Cardiovascular: Normal rate.   Respiratory: Effort normal and breath sounds normal.  GI: Soft. Bowel sounds are normal.  Musculoskeletal:       Feet:  Neurological: She is alert and oriented to person, place, and time.  Skin: Skin is warm and dry.  Psychiatric: She has a normal mood and affect.    Assessment/Plan: Right foot chronic wound with osteo on MRI 1)  To the OR today for a right foot irrigation/debridement and likely a transmetatarsal amputation.  She understands the need for this surgery.  Mcarthur Rossetti 06/02/2015, 1:57 PM

## 2015-06-02 NOTE — Transfer of Care (Signed)
Immediate Anesthesia Transfer of Care Note  Patient: [name]  Procedure(s) Performed: Procedure(s): INCISION AND DRAINAGE OF RIGHT 3rd RAY RESECTION (Right)  Patient Location: PACU  Anesthesia Type:General  Level of Consciousness: awake, alert  and oriented  Airway & Oxygen Therapy: Patient Spontanous Breathing and Patient connected to nasal cannula oxygen  Post-op Assessment: Report given to RN and Post -op Vital signs reviewed and stable  Post vital signs: Reviewed and stable  Last Vitals:  Filed Vitals:   06/02/15 1811  BP: 140/54  Pulse: 84  Temp: 36.9 C  Resp: 19    Complications: No apparent anesthesia complications

## 2015-06-02 NOTE — Op Note (Signed)
[name], [name]              ACCOUNT NO.:  [id number]  MEDICAL RECORD NO.:  [id number]  LOCATION:  5W31C                        FACILITY:  [facility]  PHYSICIAN:  Vanita Panda. [name], M.D.DATE OF BIRTH:  15-Sep-1965  DATE OF PROCEDURE:  06/02/2015 DATE OF DISCHARGE:                              OPERATIVE REPORT   PREOPERATIVE DIAGNOSIS:  Right foot abscess with osteomyelitis involving the third ray.  POSTOPERATIVE DIAGNOSIS:  Right foot abscess with osteomyelitis involving the third ray.  PROCEDURE:  Irrigation and debridement of right foot, including sharp excisional debridement of skin, soft tissue, fascia, tendons, and bone, with third ray resection.  FINDINGS:  Osteomyelitis of the right third ray.  SURGEON:  Vanita Panda. [name], M.D.  ANESTHESIA:  General.  BLOOD LOSS:  Less than 50 mL.  COMPLICATIONS:  None.  INDICATIONS:  Ms. Holton is a diabetic female, with a history of a right foot wound.  She has had a first and second ray amputation before as well as a third toe amputation, but the third ray remains.  She developed a callus on the plantar aspect of her foot and eventually developed a wound on the dorsum all along the third ray.  This started to become painful for her, which is worrisome for someone, who is neuropathic.  She presented to the emergency room last night with a high white blood cell count peripherally.  An MRI was obtained this morning, which showed findings consistent with osteomyelitis.  I recommended she undergo an irrigation and debridement of her right foot with third ray resection.  She does understand that we may end up needing to perform a transmetatarsal amputation.  PROCEDURE DESCRIPTION:  After informed consent was obtained and appropriate right foot was marked, she was brought to the operating room, placed supine on the operating table.  General anesthesia was then obtained.  Her right foot was prepped and draped with  Betadine paint.  A time-out was called.  She was __________ foot.  I then used an Esmarch around the ankle __________ towel as a local tourniquet.  I used a #10 blade and sharply dissected around the third ray, down to the bone, finding gross purulence.  I was able to come proximally, where the third metatarsal had nice solid unaffected appearing bone and used a bone cutting forceps to cut the bone at this level and it was nice and normal appearing bone.  I then used the #10 blade to sharply excise skin and fascia around the area as well as tendons.  Once we got back good tissue, I did let the ankle Esmarch down and hemostasis was obtained with electrocautery and there was some decent bleeding tissue.  I then irrigated the wound with pulsatile lavage and 3 L of normal saline solution.  I reapproximated the skin with interrupted 2-0 nylon suture.  Xeroform and well-padded sterile dressing was applied.  She was awakened, extubated, and taken to recovery room in stable condition.  All final counts were correct.  There were no complications noted.     Vanita Panda. [name], M.D.     CYB/MEDQ  D:  06/02/2015  T:  06/02/2015  Job:  [id number]

## 2015-06-02 NOTE — Anesthesia Preprocedure Evaluation (Addendum)
Anesthesia Evaluation  Patient identified by MRN, date of birth, ID band Patient awake    Reviewed: Allergy & Precautions, NPO status , Patient's Chart, lab work & pertinent test results  Airway Mallampati: II  TM Distance: >3 FB Neck ROM: Full    Dental  (+) Poor Dentition, Missing   Pulmonary former smoker,    breath sounds clear to auscultation       Cardiovascular hypertension, Pt. on medications  Rhythm:regular Rate:Normal     Neuro/Psych PSYCHIATRIC DISORDERS Depression    GI/Hepatic GERD  ,  Endo/Other  diabetes, Type 2, Insulin DependentHypothyroidism   Renal/GU Renal diseaseS/p renal transplant 2011     Musculoskeletal   Abdominal   Peds  Hematology   Anesthesia Other Findings   Reproductive/Obstetrics                            Anesthesia Physical Anesthesia Plan  ASA: III  Anesthesia Plan: General   Post-op Pain Management:    Induction: Intravenous  Airway Management Planned: LMA  Additional Equipment:   Intra-op Plan:   Post-operative Plan: Extubation in OR  Informed Consent: I have reviewed the patients History and Physical, chart, labs and discussed the procedure including the risks, benefits and alternatives for the proposed anesthesia with the patient or authorized representative who has indicated his/her understanding and acceptance.   Dental advisory given  Plan Discussed with: Anesthesiologist and Surgeon  Anesthesia Plan Comments:         Anesthesia Quick Evaluation

## 2015-06-02 NOTE — Progress Notes (Signed)
OT Cancellation Note  Patient Details Name: [name] MRN: [id number] DOB: [birth year]   Cancelled Treatment:    Reason Eval/Treat Not Completed: Other (comment) note plan for surgery today. Will hold evaluation until after surgery.  Earlie Raveling OTR/L [phone] 06/02/2015, 2:55 PM

## 2015-06-02 NOTE — Progress Notes (Signed)
Utilization review completed.  

## 2015-06-03 ENCOUNTER — Encounter (HOSPITAL_COMMUNITY): Payer: Self-pay | Admitting: Orthopaedic Surgery

## 2015-06-03 ENCOUNTER — Inpatient Hospital Stay (HOSPITAL_COMMUNITY): Payer: Medicare Other

## 2015-06-03 DIAGNOSIS — L97519 Non-pressure chronic ulcer of other part of right foot with unspecified severity: Secondary | ICD-10-CM | POA: Diagnosis present

## 2015-06-03 DIAGNOSIS — L02611 Cutaneous abscess of right foot: Secondary | ICD-10-CM | POA: Diagnosis present

## 2015-06-03 DIAGNOSIS — E1069 Type 1 diabetes mellitus with other specified complication: Secondary | ICD-10-CM | POA: Diagnosis not present

## 2015-06-03 DIAGNOSIS — M9080 Osteopathy in diseases classified elsewhere, unspecified site: Secondary | ICD-10-CM

## 2015-06-03 LAB — BASIC METABOLIC PANEL
ANION GAP: 10 (ref 5–15)
BUN: 40 mg/dL — ABNORMAL HIGH (ref 6–20)
CALCIUM: 9.6 mg/dL (ref 8.9–10.3)
CHLORIDE: 107 mmol/L (ref 101–111)
CO2: 20 mmol/L — ABNORMAL LOW (ref 22–32)
CREATININE: 2.3 mg/dL — AB (ref 0.44–1.00)
GFR calc non Af Amer: 24 mL/min — ABNORMAL LOW (ref >60)
GFR, EST AFRICAN AMERICAN: 28 mL/min — AB (ref >60)
Glucose, Bld: 205 mg/dL — ABNORMAL HIGH (ref 65–99)
Potassium: 4.5 mmol/L (ref 3.5–5.1)
SODIUM: 137 mmol/L (ref 135–145)

## 2015-06-03 LAB — CBC
HCT: 39.3 % (ref 36.0–46.0)
HEMOGLOBIN: 12.5 g/dL (ref 12.0–15.0)
MCH: 28.7 pg (ref 26.0–34.0)
MCHC: 31.8 g/dL (ref 30.0–36.0)
MCV: 90.3 fL (ref 78.0–100.0)
PLATELETS: 264 10*3/uL (ref 150–400)
RBC: 4.35 MIL/uL (ref 3.87–5.11)
RDW: 13.5 % (ref 11.5–15.5)
WBC: 9.5 10*3/uL (ref 4.0–10.5)

## 2015-06-03 LAB — GLUCOSE, CAPILLARY
GLUCOSE-CAPILLARY: 263 mg/dL — AB (ref 65–99)
GLUCOSE-CAPILLARY: 219 mg/dL — AB (ref 65–99)
GLUCOSE-CAPILLARY: 178 mg/dL — AB (ref 65–99)
Glucose-Capillary: 182 mg/dL — ABNORMAL HIGH (ref 65–99)
Glucose-Capillary: 110 mg/dL — ABNORMAL HIGH (ref 65–99)

## 2015-06-03 MED ORDER — INSULIN ASPART PROT & ASPART (70-30 MIX) 100 UNIT/ML ~~LOC~~ SUSP: 30.0000 [IU] | Freq: Two times a day (BID) | SUBCUTANEOUS | Status: DC

## 2015-06-03 MED ORDER — INSULIN ASPART PROT & ASPART (70-30 MIX) 100 UNIT/ML ~~LOC~~ SUSP
35.0000 [IU] | Freq: Two times a day (BID) | SUBCUTANEOUS | Status: DC
  Administered 2015-06-03 – 2015-06-04 (×2): 35 [IU] via SUBCUTANEOUS
  Filled 2015-06-03: qty 10

## 2015-06-03 MED ORDER — GLUCERNA SHAKE PO LIQD
237.0000 mL | Freq: Three times a day (TID) | ORAL | Status: DC
  Administered 2015-06-03 – 2015-06-06 (×7): 237 mL via ORAL

## 2015-06-03 MED ORDER — INSULIN ASPART 100 UNIT/ML ~~LOC~~ SOLN
0.0000 [IU] | Freq: Three times a day (TID) | SUBCUTANEOUS | Status: DC
  Administered 2015-06-03: 3 [IU] via SUBCUTANEOUS
  Administered 2015-06-04: 2 [IU] via SUBCUTANEOUS
  Administered 2015-06-05 – 2015-06-06 (×2): 3 [IU] via SUBCUTANEOUS
  Administered 2015-06-06 (×2): 5 [IU] via SUBCUTANEOUS

## 2015-06-03 MED ORDER — VANCOMYCIN HCL IN DEXTROSE 750-5 MG/150ML-% IV SOLN
750.0000 mg | INTRAVENOUS | Status: DC
  Administered 2015-06-03: 750 mg via INTRAVENOUS
  Filled 2015-06-03 (×2): qty 150

## 2015-06-03 NOTE — Evaluation (Signed)
Occupational Therapy Evaluation Patient Details Name: [name] MRN: [id number] DOB: Jun 21, 1966 Today's Date: 06/03/2015    History of Present Illness Pt admitted with R foot abscess with 3rd ray osteomyelitis, s/p I&D R foot 06/02/15.  PMH: uncontrolled DM, renal transplant HTN, previous toe amputations.   Clinical Impression   Pt was independent prior to admission in self care and mobility.  Presents with R foot pain, nausea, generalized weakness and impaired standing balance interfering with ability to perform at baseline.  Will follow acutely.  No follow up OT anticipated.    Follow Up Recommendations  No OT follow up    Equipment Recommendations   (RW)    Recommendations for Other Services       Precautions / Restrictions Precautions Required Braces or Orthoses: Other Brace/Splint (DARCO shoe) Restrictions Other Position/Activity Restrictions: DARCO shoe R foot, weight on heel only      Mobility Bed Mobility Overal bed mobility: Modified Independent             General bed mobility comments: flat bed without rail  Transfers Overall transfer level: Needs assistance Equipment used: Rolling walker (2 wheeled) Transfers: Stand Pivot Transfers;Sit to/from Stand Sit to Stand: Supervision Stand pivot transfers: Supervision       General transfer comment: cues for hand placement    Balance Overall balance assessment: Needs assistance   Sitting balance-Leahy Scale: Good       Standing balance-Leahy Scale: Fair                              ADL Overall ADL's : Needs assistance/impaired Eating/Feeding: Independent;Sitting   Grooming: Wash/dry hands;Standing;Supervision/safety   Upper Body Bathing: Set up;Sitting   Lower Body Bathing: Supervison/ safety;Sit to/from stand   Upper Body Dressing : Set up;Sitting   Lower Body Dressing: Supervision/safety;Sit to/from stand   Toilet Transfer: Supervision/safety;Ambulation;RW;BSC    Toileting- Architect and Hygiene: Supervision/safety;Sit to/from stand       Functional mobility during ADLs: Supervision/safety;Rolling walker;Cueing for safety General ADL Comments: Instructed in donning DARCO shoe.     Vision     Perception     Praxis      Pertinent Vitals/Pain Pain Assessment: 0-10 Pain Score: 8  Pain Location: R foot Pain Descriptors / Indicators: Aching Pain Intervention(s): Limited activity within patient's tolerance;Monitored during session;RN gave pain meds during session;Repositioned     Hand Dominance Right   Extremity/Trunk Assessment Upper Extremity Assessment Upper Extremity Assessment: Generalized weakness   Lower Extremity Assessment Lower Extremity Assessment: Defer to PT evaluation       Communication Communication Communication: No difficulties   Cognition Arousal/Alertness: Awake/alert Behavior During Therapy: WFL for tasks assessed/performed Overall Cognitive Status: Within Functional Limits for tasks assessed                     General Comments       Exercises       Shoulder Instructions      Home Living Family/patient expects to be discharged to:: Private residence Living Arrangements: Spouse/significant other;Children (37 y.o son) Available Help at Discharge: Family;Available PRN/intermittently Type of Home: House Home Access: Stairs to enter [employer] of Steps: 4 Entrance Stairs-Rails: Right;Can reach both;Left Home Layout: One level     Bathroom Shower/Tub: Chief Strategy Officer: Standard     Home Equipment: Information systems manager;Other (comment) (crutches)          Prior Functioning/Environment Level of  Independence: Independent             OT Diagnosis: Generalized weakness;Acute pain   OT Problem List: Decreased strength;Decreased activity tolerance;Impaired balance (sitting and/or standing);Decreased knowledge of use of DME or AE;Pain   OT  Treatment/Interventions: Self-care/ADL training;DME and/or AE instruction;Therapeutic activities;Patient/family education;Balance training;Therapeutic exercise    OT Goals(Current goals can be found in the care plan section) Acute Rehab OT Goals Patient Stated Goal: Return home OT Goal Formulation: With patient Time For Goal Achievement: 06/10/15 Potential to Achieve Goals: Good ADL Goals Pt Will Perform Grooming: with modified independence;standing Pt Will Transfer to Toilet: with modified independence;ambulating;regular height toilet Pt Will Perform Toileting - Clothing Manipulation and hygiene: with modified independence;sit to/from stand Pt Will Perform Tub/Shower Transfer: Tub transfer;with modified independence;ambulating;shower seat;rolling walker Pt/caregiver will Perform Home Exercise Program: Both right and left upper extremity;With theraband;Independently (level 2)  OT Frequency: Min 2X/week   Barriers to D/C:            Co-evaluation              End of Session Equipment Utilized During Treatment: Rolling walker;Gait belt  Activity Tolerance: Treatment limited secondary to medical complications (Comment);Patient limited by pain (nausea) Patient left: in bed;with call bell/phone within reach;with nursing/sitter in room   Time: 1478-2956 OT Time Calculation (min): 17 min Charges:  OT General Charges $OT Visit: 1 Procedure OT Evaluation $Initial OT Evaluation Tier I: 1 Procedure G-Codes:    Evern Bio 06/03/2015, 3:59 PM [phone]

## 2015-06-03 NOTE — Progress Notes (Signed)
[facility]  Patient Status: Active (receiving services up to time of hospitalization)  [facility] is providing the following services: RN  If patient discharges after hours, please call [phone].   Sherryll Burger 06/03/2015, 4:18 PM

## 2015-06-03 NOTE — Consult Note (Addendum)
WOC consult requested prior to ortho service involvement. Pt has gone to surgery and they are now following for assessment and plan of care. Please re-consult if further assistance is needed.  Thank-you,  Cammie Mcgee MSN, RN, CWOCN, [place], CNS [phone]

## 2015-06-03 NOTE — Progress Notes (Signed)
When pt got up to [name], bloody drainage was coming from dressing. Paged Dr [name], who said to leave dressing as is and he might come change it tonight. Will continue to monitor pt. Bed remains in lowest position and call bell is within reach.

## 2015-06-03 NOTE — Care Management Note (Signed)
Case Management Note  Patient Details  Name: [name] MRN: [id number] Date of Birth: 10/10/1965  Subjective/Objective:      Date:06/03/15 Spoke with patient at the bedside. Introduced self as Sports coach and explained role in discharge planning and how to be reached. Verified patient lives in town, alone with spouse, has DME shower chair. Expressed potential need for bsc other DME. Verified patient anticipates to go home with family, at time of discharge and will have  part-time supervision by family  at this time to best of their knowledge. Patient  denied needing help with their medication. Patient has problems with transportation  to MD appointments, will consult CSW. Verified patient has PCP [facility] apt.   Plan: CM will continue to follow for discharge planning and [facility] resources.               Action/Plan:   Expected Discharge Date:                  Expected Discharge Plan:  Home w Home Health Services  In-House Referral:  Clinical Social Work  Discharge planning Services  CM Consult  Post Acute Care Choice:  Durable Medical Equipment, Home Health, Resumption of Svcs/PTA Provider Choice offered to:  Patient  DME Arranged:  Bedside commode DME Agency:  [facility]  HH Arranged:  RN [facility] Agency:  [facility]  Status of Service:  In process, will continue to follow  Medicare Important Message Given:    Date Medicare IM Given:    Medicare IM give by:    Date Additional Medicare IM Given:    Additional Medicare Important Message give by:     If discussed at Long Length of Stay Meetings, dates discussed:    Additional Comments:  Leone Haven, RN 06/03/2015, 3:10 PM

## 2015-06-03 NOTE — Progress Notes (Addendum)
CRITICAL VALUE ALERT  Critical value received:  WBC 1.2  Date of notification:  06/03/15   Time of notification:  0800  Critical value read back:Yes.    Nurse who received alert:  Reed Breech  MD notified (1st page):  Orest Dikes  Time of first page:  0805  MD notified (2nd page): Orest Dikes  Time of second page: 0825  Responding MD:  Orest Dikes  Time MD responded:  0830  Also, nurse notified Dr Dimple Casey regarding pts heart rate sustaining in the 140's-150's. No new orders given at this time. Will continue to monitor pt. Bed remains in lowest position and call bell is within reach.

## 2015-06-03 NOTE — Progress Notes (Signed)
Patient ID: [name], female   DOB: 1965/10/22, 49 y.o.   MRN: [id number] Right foot dressing clean and dry.  IV antibiotics today, then discharge to home tomorrow on oral antibiotics.

## 2015-06-03 NOTE — Progress Notes (Signed)
Orthopedic Tech Progress Note Patient Details:  [name] 1966/05/15 [id number]  Ortho Devices Type of Ortho Device: Darco shoe Ortho Device/Splint Location: RLE Ortho Device/Splint Interventions: Application   Asia R Janee Morn 06/03/2015, 6:40 AM

## 2015-06-03 NOTE — Progress Notes (Signed)
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 She had a plantar ulcer for ?almost 3 months but for the last 10-14 days has been painful and red. She went [facility] ED on 9/15 and was given oral clindamycin without event for home health nurse and recommended for one care clinic follow-up. She didn't noticed another ulcer at the base of the right third toe yesterday. Also noted some swelling of her right foot. She had nausea with 2 episodes of vomiting but no fevers or chills. She also had several episodes of diarrhea on the day of admission. In the ED patient was afebrile with normal WBC. X-ray of the foot was negative for osteomyelitis. Patient admitted with concern for right foot ulcer and osteomyelitis with failed outpatient therapy. MRI of the foot done showing osteomyelitis of the distal third metatarsal and remnant of the proximal phalanx of the third toe with possible abscess and osteomyelitis of distal proximal phalanx of fifth toe.   Assessment/Plan: Acute osteomyelitis with a right foot abscess of the right third and fifth toes with foot ulcer -Contributed by uncontrolled diabetes. Patient has had multiple toe amputations done and followed by Dr. [name] in the past.  -Dr. [name] took patient to our with I&D of right foot including sharp excisional debridement of skin, soft tissue, fascia, tendons and bone with third ray dissection. Dressing applied. Continue pain medications when necessary. Has some bleeding from the surgical site which has been  notified to orthopedics.. -Continue IV antibiotics for today. -PT/OT evaluation.  Uncontrolled type 1 diabetes mellitus A1c of 11.6 in June. Very poorly controlled with multiple hospitalization for DKA (5 hospitalizations and past 1 year). Patient reports taking NPH 70/30, 30 units twice a day which she buys at [place]. She reports she does not she a PCP or an endocrinologist for her diabetes. Her PCP is listed as Dr.colodonato who follows her for her kidney issue and last saw him 6 months back.. On reviewing prior hospitalizations there were discussions about sending her to [facility] but that never happened. -Patient reports that her blood glucose are usually in the range of 300-500. -Diabetic coordinator consulted. We will assign an appointment at the [facility] prior to discharge. -Increase NPH to 35 u bid with moderate SSI coverage.   History of renal transplant with acute on chronic disease stage III Possibly secondary to dehydration and infection. Continue IV hydration. Check urine lites and osmolality. Avoid nephrotoxins. Resume Prograf, CellCept and prednisone. Check Prograf level.  -Check renal ultrasound. Will consult renal if renal function not improved in a.m.  Hypothyroidism Continue Synthroid  Hypertension, essential Continue Coreg and amlodipine  GERD Continue Pepcid  Diarrhea Possibly associated with antibiotics. Now resolved. C. difficile negative  Diet: Diabetic DVT prophylaxis: Subcutaneous heparin   Code Status: Full code Family Communication: Husband at bedside Disposition Plan: Pending PT evaluation and improvement in her renal function. Possibly in the next 48 hours.   Consultants:  Dr. [name] (orthopedics)  Procedures:  MRI right foot  Antibiotics: IV vancomycin and Zosyn 9/24-9/26  HPI/Subjective: Patient seen and examined. Has minimal bleeding from her right  operative wound. Denies any  pain.  Objective: Filed Vitals:   06/03/15 1035  BP: 111/43  Pulse: 81  Temp:   Resp:     Intake/Output Summary (Last 24 hours) at 06/03/15 1318 Last data filed at 06/03/15 1100  Gross per 24 hour  Intake    450 ml  Output    400 ml  Net     50 ml   Filed Weights   06/01/15 1706 06/02/15 0000  Weight: 96.163 kg (212 lb) 96.7 kg (213 lb 3 oz)    Exam:   General:   no acute distress  HEENT: moist oral mucosa, supple neck  Chest: Clear to auscultation bilaterally  CVS: Normal S1 and S2, no murmurs rub or gallop  Chest: Soft, nondistended, nontender, pulses present  Musculoskeletal: No edema, dressing over right foot with bleeding from an palpitation site.  CNS: Alert and oriented  Data Reviewed: Basic Metabolic Panel:  Recent Labs Lab 06/01/15 1721 06/02/15 0645 06/03/15 0316  NA 140 136 137  K 4.7 4.7 4.5  CL 105 103 107  CO2  --  21* 20*  GLUCOSE 115* 244* 205*  BUN 36* 35* 40*  CREATININE 1.40* 2.10* 2.30*  CALCIUM  --  9.8 9.6   Liver Function Tests:  Recent Labs Lab 06/02/15 0645  AST 20  ALT 15  ALKPHOS 70  BILITOT 1.1  PROT 6.3*  ALBUMIN 2.8*   No results for input(s): LIPASE, AMYLASE in the last 168 hours. No results for input(s): AMMONIA in the last 168 hours. CBC:  Recent Labs Lab 06/01/15 1710 06/01/15 1721 06/02/15 0645 06/03/15 0316  WBC 6.7  --  11.7* 9.5  NEUTROABS 4.5  --   --   --   HGB 12.3 13.9 12.5 12.5  HCT 38.8 41.0 39.2 39.3  MCV 90.0  --  91.6 90.3  PLT 336  --  275 264   Cardiac Enzymes: No results for input(s): CKTOTAL, CKMB, CKMBINDEX, TROPONINI in the last 168 hours. BNP (last 3 results) No results for input(s): BNP in the last 8760 hours.  ProBNP (last 3 results) No results for input(s): PROBNP in the last 8760 hours.  CBG:  Recent Labs Lab 06/02/15 1626 06/02/15 2044 06/02/15 2201 06/03/15 0752 06/03/15 1201  GLUCAP 206* 182* 166* 263* 219*    Recent Results (from the past 240 hour(s))   Culture, blood (single)     Status: None (Preliminary result)   Collection Time: 06/01/15 10:37 PM  Result Value Ref Range Status   Specimen Description BLOOD RIGHT FOREARM  Final   Special Requests BOTTLES DRAWN AEROBIC AND ANAEROBIC 5CC  Final   Culture NO GROWTH 2 DAYS  Final   Report Status PENDING  Incomplete  MRSA PCR Screening     Status: Abnormal   Collection Time: 06/02/15 12:10 AM  Result Value Ref Range Status   MRSA by PCR POSITIVE (A) NEGATIVE Final    Comment:        The GeneXpert MRSA Assay (FDA approved for NASAL specimens only), is one component of a comprehensive MRSA colonization surveillance program. It is not intended to diagnose MRSA infection nor to guide or monitor treatment for MRSA infections. RESULT CALLED TO, READ BACK BY AND VERIFIED WITH: M [email or web address]  06/02/15 MKELLY   C difficile quick scan w PCR reflex     Status: None   Collection Time: 06/02/15  1:00 AM  Result Value Ref Range Status   C Diff antigen NEGATIVE NEGATIVE Final  C Diff toxin NEGATIVE NEGATIVE Final   C Diff interpretation Negative for toxigenic C. difficile  Final  Culture, blood (single)     Status: None (Preliminary result)   Collection Time: 06/02/15  1:55 AM  Result Value Ref Range Status   Specimen Description BLOOD LEFT HAND  Final   Special Requests BOTTLES DRAWN AEROBIC ONLY 5CC  Final   Culture NO GROWTH 1 DAY  Final   Report Status PENDING  Incomplete  Surgical pcr screen     Status: Abnormal   Collection Time: 06/02/15  2:05 PM  Result Value Ref Range Status   MRSA, PCR NEGATIVE NEGATIVE Final   Staphylococcus aureus POSITIVE (A) NEGATIVE Final    Comment:        The Xpert SA Assay (FDA approved for NASAL specimens in patients over 34 years of age), is one component of a comprehensive surveillance program.  Test performance has been validated by [facility] for patients greater than or equal to 42 year old. It is not intended to diagnose infection nor  to guide or monitor treatment.      Studies: Mr Foot Right Wo Contrast  06/02/2015   CLINICAL DATA:  Diabetic patient with an ulceration of the distal right third ray which developed 3 days ago. A second ulcer on the right foot developed yesterday. History of prior amputation.  EXAM: MRI OF THE RIGHT FOREFOOT WITHOUT CONTRAST  TECHNIQUE: Multiplanar, multisequence MR imaging was performed. No intravenous contrast was administered.  COMPARISON:  Plain films of the right foot 06/01/2015.  FINDINGS: As seen on the comparison plain films, the patient is status post amputation of the second ray at the level of the distal diaphysis. There is also amputation of the third toe at the level of the base of the proximal phalanx. There is edema in the remnant of the proximal phalanx of the third toe and in the distal 1.5 cm of the third metatarsal compatible with osteomyelitis. There appears to be a skin ulceration over the dorsal aspect of the third toe remnant. Although lack of IV contrast somewhat limits evaluation for abscess, there appears to be focal fluid measuring 1.6 cm transverse by 1.0 cm craniocaudal by 1.1 cm long at the site of the ulceration worrisome for abscess.  There is marrow edema in the distal 1 cm of the proximal phalanx of the little toe with corresponding decreased T1 signal worrisome for osteomyelitis. No associated abscess is identified.  Degenerative early neuropathic change is seen about the midfoot most notable at the second and third tarsometatarsal joints where there is subchondral cyst formation. Bone marrow signal is otherwise unremarkable. No mass is identified. Intrinsic musculature the foot is markedly atrophied.  IMPRESSION: Findings consistent with osteomyelitis in the distal 1.5 cm of the third metatarsal and remnant of the proximal phalanx of the third toe. There may be an associated abscess at the site of a skin ulceration on the dorsal aspect of the third toe remnant.  Marrow  edema in the distal 1 cm of the proximal phalanx of the fifth toe worrisome for osteomyelitis. No associated abscess is identified.  Degenerative versus early neuropathic change midfoot.   Electronically Signed   By: Drusilla Kanner M.D.   On: 06/02/2015 12:15   Dg Foot Complete Right  06/01/2015   CLINICAL DATA:  Soft tissue infection in the distal right foot  EXAM: RIGHT FOOT COMPLETE - 3+ VIEW  COMPARISON:  05/23/2015  FINDINGS: There again noted changes consistent with amputation  of the first through third digits. Fractures noted in the proximal fifth metatarsal. Some generalized soft tissue swelling is noted distally. No areas of bony erosion to suggest osteomyelitis are seen.  IMPRESSION: Soft tissue changes without acute osteomyelitis.   Electronically Signed   By: Alcide Clever M.D.   On: 06/01/2015 21:57    Scheduled Meds: . amLODipine  5 mg Oral Daily  . carvedilol  50 mg Oral BID WC  . Chlorhexidine Gluconate Cloth  6 each Topical Q0600  . famotidine  20 mg Oral Daily  . heparin  5,000 Units Subcutaneous 3 times per day  . insulin aspart  0-9 Units Subcutaneous TID WC  . insulin aspart protamine- aspart  25 Units Subcutaneous BID WC  . levothyroxine  137 mcg Oral QPM  . mupirocin ointment   Nasal BID  . mycophenolate  500 mg Oral BID  . orphenadrine  100 mg Oral BID  . piperacillin-tazobactam (ZOSYN)  IV  3.375 g Intravenous Q8H  . predniSONE  5 mg Oral Daily  . sodium chloride  3 mL Intravenous Q12H  . tacrolimus  1 mg Oral BID  . vancomycin  750 mg Intravenous Q24H   Continuous Infusions: . sodium chloride 125 mL/hr at 06/03/15 1028      Time spent: 35 minutes    DHUNGEL, NISHANT  [facility] Pager [phone]. If 7PM-7AM, please contact night-coverage at [email or web address], password [email or web address] 06/03/2015, 1:18 PM  LOS: 2 days

## 2015-06-03 NOTE — Progress Notes (Signed)
ANTIBIOTIC CONSULT NOTE Pharmacy Consult for Vancomycin Indication: diabetic foot infection  Allergies  Allergen Reactions  . Ace Inhibitors Other (See Comments)    Cough   ( Lisinopril )  . Adhesive [Tape] Itching    Patient Measurements: Height:  (172.7 cm) Weight: 213 lb 3 oz (96.7 kg) IBW/kg (Calculated) : 63.9  Vital Signs: Temp: 97.8 F (36.6 C) (09/26 0545) Temp Source: Oral (09/26 0545) BP: 122/62 mmHg (09/26 0545) Pulse Rate: 86 (09/26 0545)  Labs:  Recent Labs  06/01/15 1710 06/01/15 1721 06/02/15 0645 06/03/15 0316  WBC 6.7  --  11.7* 9.5  HGB 12.3 13.9 12.5 12.5  PLT 336  --  275 264  CREATININE  --  1.40* 2.10* 2.30*   Estimated Creatinine Clearance: 36.4 mL/min (by C-G formula based on Cr of 2.3).   Assessment: 49yo female continues on IV Vancomycin and Zosyn for foot infection. Now s/p I + D Scr has continued to increase to 2.30 today She s/p kidney transplant  Goal of Therapy:  Vancomycin trough level 10-15 mcg/ml  Plan:  Decrease Vancomycin to 750 mg iv Q 24 hours Continue Zosyn 3.375 grams iv Q 8 hours Continue to follow  Thank you Okey Regal, PharmD [phone] 06/03/2015,10:22 AM

## 2015-06-03 NOTE — Progress Notes (Signed)
Initial Nutrition Assessment  DOCUMENTATION CODES:   Obesity unspecified  INTERVENTION:   Glucerna Shake po TID, each supplement provides 220 kcal and 10 grams of protein  NUTRITION DIAGNOSIS:   Inadequate oral intake related to poor appetite as evidenced by meal completion < 25%.  GOAL:   Patient will meet greater than or equal to 90% of their needs  MONITOR:   PO intake, Supplement acceptance, Labs, Weight trends, Skin, I & O's  REASON FOR ASSESSMENT:   Malnutrition Screening Tool    ASSESSMENT:   [name] is a 49 y.o. 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  Pt admitted for DM foot infection.   S/p Procedure(s) on 06/02/15: INCISION AND DRAINAGE OF RIGHT 3rd RAY RESECTION (Right)  Spoke with pt at bedside. She reports poor appetite since surgery ("nothing tastes right"). Per doc flowsheets, meal completion 0%. Observed lunch tray- pt only picked bacon off her hamburger; the rest of the tray was unattempted. Pt reports appetite was fair PTA; she was eating less.   Pt reports wt loss, however, unable to provide wt hx. Per documented wt hx, UBW ranges from 215-225#. Not significant wt changes noted.  Pt reports hx of uncontrolled DM. Home CBGS generally range in the 200's and above. Historically Hgb A1c ranges from 8.5-12.2. Most recent Hgb A1c was 11.6 on 03/02/15. Pt may benefit from further education at a later date.   Nutrition-Focused physical exam completed. Findings are no fat depletion, mild muscle depletion, and mild edema. Pt reports decline in mobility PTA; unable to walk far distances.  Discussed importance of good PO intake to promote healing. Pt is amenable to supplement to optimize nutritional intake.   Labs reviewed.  Diet Order:  Diet Carb Modified Fluid consistency:: Thin; Room  service appropriate?: Yes  Skin:  Wound (see comment) (rt foot incision)  Last BM:  06/02/15  Height:   Ht Readings from Last 1 Encounters:  06/02/15  (1.727 m)    Weight:   Wt Readings from Last 1 Encounters:  06/02/15 213 lb 3 oz (96.7 kg)    Ideal Body Weight:  63.6 kg  BMI:  Body mass index is 32.42 kg/(m^2).  Estimated Nutritional Needs:   Kcal:  1700-1900  Protein:  80-95 grams  Fluid:  1.7-1.9 L  EDUCATION NEEDS:   Education needs addressed  Jenifer A. Mayford Knife, RD, LDN, CDE Pager: [phone] After hours Pager: [phone]

## 2015-06-04 DIAGNOSIS — IMO0002 Reserved for concepts with insufficient information to code with codable children: Secondary | ICD-10-CM | POA: Diagnosis present

## 2015-06-04 DIAGNOSIS — Z940 Kidney transplant status: Secondary | ICD-10-CM

## 2015-06-04 DIAGNOSIS — L97509 Non-pressure chronic ulcer of other part of unspecified foot with unspecified severity: Secondary | ICD-10-CM

## 2015-06-04 DIAGNOSIS — E10621 Type 1 diabetes mellitus with foot ulcer: Secondary | ICD-10-CM | POA: Diagnosis present

## 2015-06-04 DIAGNOSIS — E1065 Type 1 diabetes mellitus with hyperglycemia: Secondary | ICD-10-CM | POA: Diagnosis present

## 2015-06-04 LAB — GLUCOSE, CAPILLARY
GLUCOSE-CAPILLARY: 27 mg/dL — AB (ref 65–99)
GLUCOSE-CAPILLARY: 91 mg/dL (ref 65–99)
GLUCOSE-CAPILLARY: 110 mg/dL — AB (ref 65–99)
Glucose-Capillary: 140 mg/dL — ABNORMAL HIGH (ref 65–99)
Glucose-Capillary: 22 mg/dL — CL (ref 65–99)
Glucose-Capillary: 37 mg/dL — CL (ref 65–99)
Glucose-Capillary: 101 mg/dL — ABNORMAL HIGH (ref 65–99)

## 2015-06-04 LAB — BASIC METABOLIC PANEL
ANION GAP: 8 (ref 5–15)
BUN: 35 mg/dL — ABNORMAL HIGH (ref 6–20)
CALCIUM: 9.6 mg/dL (ref 8.9–10.3)
CO2: 21 mmol/L — AB (ref 22–32)
Chloride: 107 mmol/L (ref 101–111)
Creatinine, Ser: 1.7 mg/dL — ABNORMAL HIGH (ref 0.44–1.00)
GFR calc non Af Amer: 35 mL/min — ABNORMAL LOW (ref >60)
GFR, EST AFRICAN AMERICAN: 40 mL/min — AB (ref >60)
Glucose, Bld: 159 mg/dL — ABNORMAL HIGH (ref 65–99)
POTASSIUM: 4.2 mmol/L (ref 3.5–5.1)
Sodium: 136 mmol/L (ref 135–145)

## 2015-06-04 LAB — TACROLIMUS LEVEL
TACROLIMUS (FK506) - LABCORP: 7.2 ng/mL (ref 2.0–20.0)
TACROLIMUS (FK506) - LABCORP: 14.8 ng/mL (ref 2.0–20.0)

## 2015-06-04 LAB — TSH: TSH: 2.01 u[IU]/mL (ref 0.350–4.500)

## 2015-06-04 MED ORDER — LOPERAMIDE HCL 2 MG PO CAPS
2.0000 mg | ORAL_CAPSULE | ORAL | Status: DC | PRN
  Administered 2015-06-04 – 2015-06-06 (×4): 2 mg via ORAL
  Filled 2015-06-04 (×4): qty 1

## 2015-06-04 MED ORDER — SODIUM CHLORIDE 0.9 % IV BOLUS (SEPSIS)
1000.0000 mL | INTRAVENOUS | Status: DC | PRN
  Administered 2015-06-04: 1000 mL via INTRAVENOUS

## 2015-06-04 MED ORDER — DEXTROSE 50 % IV SOLN
INTRAVENOUS | Status: AC
  Administered 2015-06-04: 25 mL
  Filled 2015-06-04: qty 50

## 2015-06-04 MED ORDER — OXYCODONE HCL 5 MG PO TABS: 5.0000 mg | ORAL_TABLET | Freq: Four times a day (QID) | ORAL | Status: DC | PRN

## 2015-06-04 MED ORDER — MUPIROCIN 2 % EX OINT: TOPICAL_OINTMENT | Freq: Two times a day (BID) | CUTANEOUS | Status: DC

## 2015-06-04 MED ORDER — GLUCERNA SHAKE PO LIQD: 237.0000 mL | Freq: Three times a day (TID) | ORAL | Status: DC

## 2015-06-04 MED ORDER — GLUCOSE 40 % PO GEL
1.0000 | Freq: Once | ORAL | Status: AC
  Administered 2015-06-04: 37.5 g via ORAL

## 2015-06-04 MED ORDER — DEXTROSE 50 % IV SOLN
25.0000 mL | Freq: Once | INTRAVENOUS | Status: AC
  Administered 2015-06-04: 25 mL via INTRAVENOUS

## 2015-06-04 MED ORDER — GLUCOSE 40 % PO GEL
ORAL | Status: AC
  Administered 2015-06-04: 14:00:00 via ORAL
  Filled 2015-06-04: qty 1

## 2015-06-04 MED ORDER — INSULIN NPH ISOPHANE & REGULAR (70-30) 100 UNIT/ML ~~LOC~~ SUSP: 35.0000 [IU] | Freq: Two times a day (BID) | SUBCUTANEOUS | Status: DC

## 2015-06-04 NOTE — Care Management Note (Signed)
Case Management Note  Patient Details  Name: [name] MRN: [id number] Date of Birth: 1965/10/14  Subjective/Objective:   Patient is for possible dc today, will have HH services with [facility], PT, Ot, and Social Worker, Miranda with [facility] notified.  Also Jermaine with [facility] notified for DME, rolling walker and bsc.  Patient states she has transportation at dc but will need help with transportation to MD appts.  Per occupational therapist, bp dropped this am not sure if patient will be going today , dropped to 62/38.  MD notified.                 Action/Plan:   Expected Discharge Date:                  Expected Discharge Plan:  Home w Home Health Services  In-House Referral:  Clinical Social Work  Discharge planning Services  CM Consult  Post Acute Care Choice:  Durable Medical Equipment, Home Health, Resumption of Svcs/PTA Provider Choice offered to:  Patient  DME Arranged:  Bedside commode, Walker rolling DME Agency:  [facility]  HH Arranged:  RN, PT, OT, Social Work [employer] Agency:  [facility]  Status of Service:  Completed, signed off  Medicare Important Message Given:  Yes-second notification given Date Medicare IM Given:    Medicare IM give by:    Date Additional Medicare IM Given:    Additional Medicare Important Message give by:     If discussed at Long Length of Stay Meetings, dates discussed:    Additional Comments:  Leone Haven, RN 06/04/2015, 12:28 PM

## 2015-06-04 NOTE — Progress Notes (Signed)
Patient with CBG between 22-32. Alert and oriented, no complaints. She received 8 oz orange juice and Dextrose gel 37.5 gr. MD aware. Will continue to monitor.

## 2015-06-04 NOTE — Anesthesia Postprocedure Evaluation (Signed)
Anesthesia Post Note  Patient: [name]  Procedure(s) Performed: Procedure(s) (LRB): INCISION AND DRAINAGE OF RIGHT 3rd RAY RESECTION (Right)  Anesthesia type: General  Patient location: PACU  Post pain: Pain level controlled and Adequate analgesia  Post assessment: Post-op Vital signs reviewed, Patient's Cardiovascular Status Stable, Respiratory Function Stable, Patent Airway and Pain level controlled  Last Vitals:  Filed Vitals:   06/04/15 0518  BP: 132/47  Pulse: 73  Temp: 36.3 C  Resp: 17    Post vital signs: Reviewed and stable  Level of consciousness: awake, alert  and oriented  Complications: No apparent anesthesia complications

## 2015-06-04 NOTE — Evaluation (Signed)
Physical Therapy Evaluation Patient Details Name: [name] MRN: [id number] DOB: [birth year] Today's Date: 06/04/2015   History of Present Illness  Pt admitted with R foot abscess with 3rd ray osteomyelitis, s/p I&D R foot 06/02/15.  PMH: uncontrolled DM, renal transplant HTN, previous toe amputations.  Clinical Impression  Pt admitted with above diagnosis. Pt currently with functional limitations due to the deficits listed below (see PT Problem List). Pt will benefit from skilled PT to increase their independence and safety with mobility to allow discharge to the venue listed below.  Pt fatigued quickly in UE with gait with RW while maintaining WB through R heel only.  Recommend HHPT and RW.     Follow Up Recommendations Home health PT    Equipment Recommendations  Rolling walker with 5" wheels    Recommendations for Other Services       Precautions / Restrictions Precautions Required Braces or Orthoses: Other Brace/Splint (DARCO shoe) Restrictions Other Position/Activity Restrictions: DARCO shoe R foot, weight on heel only      Mobility  Bed Mobility Overal bed mobility: Modified Independent             General bed mobility comments: flat bed without rail. Pt required A from PT to donn [facility] shoe  Transfers Overall transfer level: Needs assistance Equipment used: Rolling walker (2 wheeled) Transfers: Sit to/from Stand Sit to Stand: Supervision         General transfer comment: cues for hand placement and WB  Ambulation/Gait Ambulation/Gait assistance: Min guard Ambulation Distance (Feet): 35 Feet Assistive device: Rolling walker (2 wheeled) Gait Pattern/deviations: Trunk flexed;Decreased step length - left Gait velocity: decreased Gait velocity interpretation: Below normal speed for age/gender General Gait Details: Pt fatigued quickly with gait and required cueing for staying within RW and not putting it too far forward. Pt also limited by feeling of  diarrhea.  Stairs            Wheelchair Mobility    Modified Rankin (Stroke Patients Only)       Balance Overall balance assessment: Needs assistance   Sitting balance-Leahy Scale: Good       Standing balance-Leahy Scale: Fair Standing balance comment: needs UE support with gait to maintain WB through heel of DARCO shoe                             Pertinent Vitals/Pain Pain Assessment: 0-10 Pain Score: 7  Pain Location: top of the R foot Pain Descriptors / Indicators: Throbbing Pain Intervention(s): Monitored during session;Repositioned    Home Living Family/patient expects to be discharged to:: Private residence Living Arrangements: Spouse/significant other;Children (61 y.o son) Available Help at Discharge: Family;Available PRN/intermittently Type of Home: House Home Access: Stairs to enter Entrance Stairs-Rails: Right;Can reach both;Left Entrance Stairs-Number of Steps: 4 Home Layout: One level Home Equipment: Shower seat;Other (comment) (crutches)      Prior Function Level of Independence: Independent               Hand Dominance   Dominant Hand: Right    Extremity/Trunk Assessment   Upper Extremity Assessment: Defer to OT evaluation           Lower Extremity Assessment: RLE deficits/detail RLE Deficits / Details: wrap on foot       Communication   Communication: No difficulties  Cognition Arousal/Alertness: Awake/alert Behavior During Therapy: WFL for tasks assessed/performed Overall Cognitive Status: Within Functional Limits for tasks assessed  General Comments      Exercises        Assessment/Plan    PT Assessment Patient needs continued PT services  PT Diagnosis Difficulty walking   PT Problem List Decreased strength;Decreased activity tolerance;Decreased balance;Decreased mobility;Decreased knowledge of precautions;Decreased knowledge of use of DME  PT Treatment Interventions  DME instruction;Gait training;Stair training;Functional mobility training;Therapeutic activities;Therapeutic exercise;Balance training   PT Goals (Current goals can be found in the Care Plan section) Acute Rehab PT Goals Patient Stated Goal: Return home PT Goal Formulation: With patient Time For Goal Achievement: 06/11/15 Potential to Achieve Goals: Good    Frequency Min 3X/week   Barriers to discharge        Co-evaluation               End of Session Equipment Utilized During Treatment: Gait belt;Other (comment) (DARCO shoe) Activity Tolerance: Patient tolerated treatment well Patient left: in chair;with call bell/phone within reach Nurse Communication: Mobility status (NT)         Time: 3086-5784 PT Time Calculation (min) (ACUTE ONLY): 19 min   Charges:   PT Evaluation $Initial PT Evaluation Tier I: 1 Procedure     PT G Codes:        SMITH,KAREN LUBECK 06/04/2015, 11:16 AM

## 2015-06-04 NOTE — Clinical Social Work Note (Signed)
CSW met with patient at bedside and provided patient with transportation resources. Patient states she has no other concerns at this time and will be transported home by family at time of discharge.   Liz Beach MSW, [place], [place], [phone]

## 2015-06-04 NOTE — Progress Notes (Signed)
After 25ml Dextrose 50% per hyperglycemic protocol, CBG=91. Will continue to monitor.

## 2015-06-04 NOTE — Progress Notes (Signed)
[facility] PROGRESS NOTE  ALONNIE BIEKER RUE:[id number] DOB: 09/16/1965 DOA: 06/01/2015 PCP: [name], MD  Brief narrative 49 year old female with uncontrolled  type 1 diabetes (multiple hospitalizations with DKA), history of renal transplant in 2011 with chronic kidney disease stage III, hypertension, GERD, hypothyroidism and osteomyelitis of the feet with multiple toe amputations presented to the ED with ulcer in the base of the right third toe and plantar aspect. She had a plantar ulcer for ?almost 3 months but for the last 10-14 days has been painful and red. She went [facility] ED on 9/15 and was given oral clindamycin without event for home health nurse and recommended for one care clinic follow-up. She didn't noticed another ulcer at the base of the right third toe yesterday. Also noted some swelling of her right foot. She had nausea with 2 episodes of vomiting but no fevers or chills. She also had several episodes of diarrhea on the day of admission. In the ED patient was afebrile with normal WBC. X-ray of the foot was negative for osteomyelitis. Patient admitted with concern for right foot ulcer and osteomyelitis with failed outpatient therapy. MRI of the foot done showing osteomyelitis of the distal third metatarsal and remnant of the proximal phalanx of the third toe with possible abscess and osteomyelitis of distal proximal phalanx of fifth toe.    Assessment/Plan: Acute osteomyelitis with a right foot abscess of the right third and fifth toes with foot ulcer -Contributed by uncontrolled diabetes. Patient has had multiple toe amputations done and followed by Dr. [name] in the past.  -Dr. [name] took patient to OR with I&D of right foot including sharp excisional debridement of skin, soft tissue, fascia, tendons and bone with third ray dissection. Dressing applied. Continue pain medications when necessary.  -PT and recommends home health.  Hypotension with  hypoglycemia Patient became dizzy while working with PT on 9/27 became hypotensive with systolic blood pressure in the 60s-70s and hypoglycemic in the 20s. Given orange juice with some improvement. Ordered 1 L IV normal saline bolus. Discharge held. Plan to minimize NPH dose. Normal cortisol and admission. Hold all blood pressure medications. Check TSH.   Uncontrolled type 1 diabetes mellitus A1c of 11.6 in June. Very poorly controlled with multiple hospitalization for DKA (5 hospitalizations and past 1 year). Patient reports taking NPH 70/30, 30 units twice a day which she buys at [employer]. She reports she does not she a PCP or an endocrinologist for her diabetes. Her PCP is listed as Dr.colodonato who follows her for her kidney issue and last saw him 8 months back.. On reviewing prior hospitalizations there were discussions about sending her to [facility] but that never happened. -Patient reports that her blood glucose are usually in the range of 300-500. -Diabetic coordinator consulted.  appointment scheduled at the [facility] for next week.. -Held NPH given Hypoglycemic episode this morning. Need to adjust dose prior to discharge.   History of renal transplant with acute on chronic disease stage III Possibly secondary to dehydration and infection. Improving with IV hydration.  Avoid nephrotoxins. Continue Prograf, CellCept and prednisone. Check Prograf level.  -Patient sees Dr.Colodonato. Last seen in January . Called office to schedule an appointment. I was notified by the staff that patient has a habit of skipping appointments. Missed her appointment in June. Have scheduled an appointment in 2 weeks. Counseled strongly on compliance with outpatient follow-up.   Hypothyroidism Continue Synthroid. Check TSH.  Hypotension on 9 /27 Possibly orthostatic.  Started blood pressure as low as 60s and 70s. Ordered 1 L normal saline bolus followed by maintenance fluids.  Holding all blood pressure medications.  GERD Continue Pepcid  Diarrhea Possibly associated with antibiotics. Now resolved. C. difficile negative.  Diet: Diabetic DVT prophylaxis: Subcutaneous heparin   Code Status: Full code Family Communication: none at bedside Disposition Plan: Discharge held as patient became hypotensive and hypoglycemic. Home possibly tomorrow if stable.   Consultants:  Dr. [name] (orthopedics)  Procedures:  MRI right foot  Antibiotics: IV vancomycin and Zosyn 9/24-9/26  HPI/Subjective: Patient seen and examined. No overnight issues. Patient was working with physical therapy when she suddenly became dizzy and lightheaded. Had sudden drop in her systolic blood pressure to the 60s and blood glucose of 37. Given orange juice and monitor normal saline bolus after which her symptoms improved. Discharge held.  Objective: Filed Vitals:   06/04/15 1411  BP: 92/50  Pulse: 58  Temp:   Resp: 20    Intake/Output Summary (Last 24 hours) at 06/04/15 1511 Last data filed at 06/04/15 1356  Gross per 24 hour  Intake   2325 ml  Output      0 ml  Net   2325 ml   Filed Weights   06/01/15 1706 06/02/15 0000  Weight: 96.163 kg (212 lb) 96.7 kg (213 lb 3 oz)    Exam:   General:   no acute distress  HEENT: moist oral mucosa, supple neck  Chest: Clear to auscultation bilaterally  CVS: Normal S1 and S2, no murmurs rub or gallop  Chest: Soft, nondistended, nontender, pulses present  Musculoskeletal: No edema, dressing over right foot  CNS: Alert and oriented  Data Reviewed: Basic Metabolic Panel:  Recent Labs Lab 06/01/15 1721 06/02/15 0645 06/03/15 0316 06/04/15 0846  NA 140 136 137 136  K 4.7 4.7 4.5 4.2  CL 105 103 107 107  CO2  --  21* 20* 21*  GLUCOSE 115* 244* 205* 159*  BUN 36* 35* 40* 35*  CREATININE 1.40* 2.10* 2.30* 1.70*  CALCIUM  --  9.8 9.6 9.6   Liver Function Tests:  Recent Labs Lab 06/02/15 0645  AST 20  ALT 15   ALKPHOS 70  BILITOT 1.1  PROT 6.3*  ALBUMIN 2.8*   No results for input(s): LIPASE, AMYLASE in the last 168 hours. No results for input(s): AMMONIA in the last 168 hours. CBC:  Recent Labs Lab 06/01/15 1710 06/01/15 1721 06/02/15 0645 06/03/15 0316  WBC 6.7  --  11.7* 9.5  NEUTROABS 4.5  --   --   --   HGB 12.3 13.9 12.5 12.5  HCT 38.8 41.0 39.2 39.3  MCV 90.0  --  91.6 90.3  PLT 336  --  275 264   Cardiac Enzymes: No results for input(s): CKTOTAL, CKMB, CKMBINDEX, TROPONINI in the last 168 hours. BNP (last 3 results) No results for input(s): BNP in the last 8760 hours.  ProBNP (last 3 results) No results for input(s): PROBNP in the last 8760 hours.  CBG:  Recent Labs Lab 06/04/15 1307 06/04/15 1309 06/04/15 1405 06/04/15 1406 06/04/15 1441  GLUCAP 25* 22* 29* 37* 91    Recent Results (from the past 240 hour(s))  Culture, blood (single)     Status: None (Preliminary result)   Collection Time: 06/01/15 10:37 PM  Result Value Ref Range Status   Specimen Description BLOOD RIGHT FOREARM  Final   Special Requests BOTTLES DRAWN AEROBIC AND ANAEROBIC 5CC  Final   Culture NO  GROWTH 2 DAYS  Final   Report Status PENDING  Incomplete  MRSA PCR Screening     Status: Abnormal   Collection Time: 06/02/15 12:10 AM  Result Value Ref Range Status   MRSA by PCR POSITIVE (A) NEGATIVE Final    Comment:        The GeneXpert MRSA Assay (FDA approved for NASAL specimens only), is one component of a comprehensive MRSA colonization surveillance program. It is not intended to diagnose MRSA infection nor to guide or monitor treatment for MRSA infections. RESULT CALLED TO, READ BACK BY AND VERIFIED WITH: M [email or web address]  06/02/15 MKELLY   C difficile quick scan w PCR reflex     Status: None   Collection Time: 06/02/15  1:00 AM  Result Value Ref Range Status   C Diff antigen NEGATIVE NEGATIVE Final   C Diff toxin NEGATIVE NEGATIVE Final   C Diff interpretation Negative for  toxigenic C. difficile  Final  Culture, blood (single)     Status: None (Preliminary result)   Collection Time: 06/02/15  1:55 AM  Result Value Ref Range Status   Specimen Description BLOOD LEFT HAND  Final   Special Requests BOTTLES DRAWN AEROBIC ONLY 5CC  Final   Culture NO GROWTH 1 DAY  Final   Report Status PENDING  Incomplete  Surgical pcr screen     Status: Abnormal   Collection Time: 06/02/15  2:05 PM  Result Value Ref Range Status   MRSA, PCR NEGATIVE NEGATIVE Final   Staphylococcus aureus POSITIVE (A) NEGATIVE Final    Comment:        The Xpert SA Assay (FDA approved for NASAL specimens in patients over 1 years of age), is one component of a comprehensive surveillance program.  Test performance has been validated by [facility] for patients greater than or equal to 72 year old. It is not intended to diagnose infection nor to guide or monitor treatment.      Studies: US Renal  06/03/2015   CLINICAL DATA:  "Acute kidney injury" . Right lower quadrant renal transplant.  EXAM: RENAL / URINARY TRACT ULTRASOUND COMPLETE  COMPARISON:  05/05/2014  FINDINGS: Right Kidney:  Length: 10.0 cm. Increased cortical echogenicity and decreased cortical thickness. No hydronephrosis.  Left Kidney:  Length: 8.7 cm. Moderate renal atrophy. Increased echogenicity. No hydronephrosis.  Right lower quadrant renal transplant is within normal limits, measuring 13.3 cm in greatest dimension. No hydronephrosis or perinephric fluid collection.  Bladder:  Appears normal for degree of bladder distention.  IMPRESSION: 1. Normal appearance of the right lower quadrant renal transplant. 2. Expected atrophy and increased cortical echogenicity involving the native kidneys.   Electronically Signed   By: Jeronimo Greaves M.D.   On: 06/03/2015 17:42    Scheduled Meds: . Chlorhexidine Gluconate Cloth  6 each Topical Q0600  . famotidine  20 mg Oral Daily  . feeding supplement (GLUCERNA SHAKE)  237 mL Oral TID BM  .  heparin  5,000 Units Subcutaneous 3 times per day  . insulin aspart  0-15 Units Subcutaneous TID WC  . insulin aspart protamine- aspart  35 Units Subcutaneous BID WC  . levothyroxine  137 mcg Oral QPM  . mupirocin ointment   Nasal BID  . mycophenolate  500 mg Oral BID  . orphenadrine  100 mg Oral BID  . predniSONE  5 mg Oral Daily  . sodium chloride  3 mL Intravenous Q12H  . tacrolimus  1 mg Oral BID   Continuous Infusions: . sodium  chloride 1,000 mL (06/04/15 1054)      Time spent: 25 minutes    DHUNGEL, NISHANT  [facility] Pager [phone]. If 7PM-7AM, please contact night-coverage at [email or web address], password [place] 06/04/2015, 3:11 PM  LOS: 3 days

## 2015-06-04 NOTE — Care Management Important Message (Signed)
Important Message  Patient Details  Name: [name] MRN: [id number] Date of Birth: April 12, 1966   Medicare Important Message Given:  Yes-second notification given    Kyla Balzarine 06/04/2015, 10:58 AM

## 2015-06-04 NOTE — Progress Notes (Signed)
Patient ID: [name], female   DOB: 12-28-1965, 49 y.o.   MRN: [id number] I was able to change her right foot dressing last evening and the incision and wound looked good.  I then placed a new dressing that she knows to keep clean and dry.  She knows to call my office to be seen next week for post-op follow-up.  I'll keep checking in on her while she is here.

## 2015-06-04 NOTE — Progress Notes (Signed)
Occupational Therapy Treatment Patient Details Name: [name] MRN: [id number] DOB: [birth year] Today's Date: 06/04/2015    History of present illness Pt admitted with R foot abscess with 3rd ray osteomyelitis, s/p I&D R foot 06/02/15.  PMH: uncontrolled DM, renal transplant HTN, previous toe amputations.   OT comments  Pt limited this session by significant lightheadedness with OOB today and drop in BP. Pt stood from EOB and reported feeling very lightheaded and had to sit back down. BP taken sitting and was 87/54. Pt continued to feel lightheaded even after several minutes of sitting. Pt stood for standing BP of 62/38. Informed nursing that pt not safe to d/c home today due to drop in BP and significant lightheadedness that limited activity. Recommend HHOT followup for safety. Will continue to follow on acute.    Follow Up Recommendations  Home health OT;Supervision/Assistance - 24 hour    Equipment Recommendations  3 in 1 bedside comode    Recommendations for Other Services      Precautions / Restrictions Precautions Precautions: Fall Required Braces or Orthoses: Other Brace/Splint Restrictions Weight Bearing Restrictions: No Other Position/Activity Restrictions: DARCO shoe R foot, weight on heel only       Mobility Bed Mobility Overal bed mobility: Needs Assistance Bed Mobility: Supine to Sit;Sit to Supine     Supine to sit: Supervision Sit to supine: Supervision   General bed mobility comments: supervision for safety due to lightheadedness with return to supine.   Transfers Overall transfer level: Needs assistance Equipment used: Rolling walker (2 wheeled) Transfers: Sit to/from Stand Sit to Stand: Min guard         General transfer comment: cues for hand placement.    Balance Overall balance assessment: Needs assistance   Sitting balance-Leahy Scale: Good       Standing balance-Leahy Scale: Fair                    ADL                                         General ADL Comments: Pt stood from EOB to the walker and pt reporting feeling very lightheaded. Pt had to sit back down and BP taken. BP 87/54. Pt stating the lightheadedness persisted despite sitting for several minutes. Pt stood to have BP taken in standing and BP dropped to 62/38 and again pt reporting signficant lightheadedness in standing. Pt unable to transfer to 3in1 in bathroom as goal of session due to BP dropping. Assisted pt back to supine. Per case management, pt supposed to d/c today so informed nursing that pt not safe to d/c home and informed her of BP readings. Recommend HHOT to followup for safety at d/c as pt does fatigue with activity per PT and pt report. Once pt comfortable in supine, demonstrated theraband exercises to help pt increase UE strength and activity tolerance but pt still feeling bad so did not have pt return demonstrate.       Vision                     Perception     Praxis      Cognition   Behavior During Therapy: [facility] for tasks assessed/performed Overall Cognitive Status: Within Functional Limits for tasks assessed  Extremity/Trunk Assessment  Upper Extremity Assessment Upper Extremity Assessment: Defer to OT evaluation           Exercises     Shoulder Instructions       General Comments      Pertinent Vitals/ Pain       Pain Assessment: 0-10 Pain Score: 7  Pain Location: R foot Pain Descriptors / Indicators: Discomfort Pain Intervention(s): Repositioned;Monitored during session;Other (comment) (nursing just gave meds at start of OT session)  Home Living Family/patient expects to be discharged to:: Private residence Living Arrangements: Spouse/significant other;Children (65 y.o son) Available Help at Discharge: Family;Available PRN/intermittently Type of Home: House Home Access: Stairs to enter [employer] of Steps: 4 Entrance Stairs-Rails:  Right;Can reach both;Left Home Layout: One level     Bathroom Shower/Tub: Chief Strategy Officer: Standard     Home Equipment: Information systems manager;Other (comment) (crutches)          Prior Functioning/Environment Level of Independence: Independent            Frequency Min 2X/week     Progress Toward Goals  OT Goals(current goals can now be found in the care plan section)  Progress towards OT goals: Not progressing toward goals - comment (drop in BP with functional mobility)  Acute Rehab OT Goals Patient Stated Goal: Return home  Plan Discharge plan needs to be updated    Co-evaluation                 End of Session Equipment Utilized During Treatment: Rolling walker;Gait belt   Activity Tolerance Patient limited by pain;Other (comment) (drop in BP)   Patient Left in bed;with call bell/phone within reach   Nurse Communication          Time: 0981-1914 OT Time Calculation (min): 23 min  Charges: OT General Charges $OT Visit: 1 Procedure OT Treatments $Therapeutic Activity: 23-37 mins  Lennox Laity  [phone] 06/04/2015, 12:07 PM

## 2015-06-04 NOTE — Clinical Documentation Improvement (Signed)
Hospitalist  Please document if a condition below provides greater specificity regarding the "History of renal transplant with acute on chronic disease stage III" as documented by Dr. Gonzella Lex in the progress notes 06/02/15 and 06/03/15:    Possible Clinical Conditions:   - Acute Kidney Injury on Stage 3 CKD  - Other Condition  - Unable to clinically determine  Clinical Information: Admission creatinine - 1.40 mg/dL Repeat creatinine 40/98/11 - 2.10 mg/dL   (9.14 mg/dL increase) Repeat creatinine 06/03/15 - 2.30 mg/dL   (7.82 mg/dL increase)  (please document your response in the progress notes and not on the query form itself.)  Please exercise your independent, professional judgment when responding. A specific answer is not anticipated or expected.   Thank You, Jerral Ralph  RN BSN CCDS [phone] Health Information Management [place]

## 2015-06-04 NOTE — Progress Notes (Signed)
Pharmacist Provided - Patient Medication Education Prior to Discharge   [name] is an 49 y.o. female who presented to [facility] on 06/01/2015 with a chief complaint of  Chief Complaint  Patient presents with  . Foot Ulcer      Patient will be discharged with 3 new medications  Patient being discharged without any new medications  The following medications were discussed with the patient: Norco, Norflex, oxycodone  Pain Control medications:  Yes     No  Diabetes Medications:  Yes     No  Heart Failure Medications:  Yes     No  Anticoagulation Medications:   Yes     No  Antibiotics at discharge:  Yes     No  Allergy Assessment Completed and Updated:  Yes     No Identified Patient Allergies:  Allergies  Allergen Reactions  . Ace Inhibitors Other (See Comments)    Cough   ( Lisinopril )  . Adhesive [Tape] Itching    Assessment: 49 yo female admitted w/ a diabetic foot ulcer. I was unable to spend much time with [name] as she was actively using the bedside commode and preparing to eat dinner. I quickly went through the medications that were being discontinued at discharge and briefly discussed the new pain medication they were starting. I informed her that the majority of her medications have not changed and stressed the importance of medication compliance to help prevent future hospital visits. Her discharge summary was reviewed and allergies verified.   Time spent preparing for discharge counseling: 10 Time spent counseling patient: 10  [name], PharmD Clinical Pharmacy Resident Pager: [phone] 06/04/2015, 6:05 PM

## 2015-06-04 NOTE — Discharge Instructions (Signed)
Keep your foot dressing clean and dry.  Blood Glucose Monitoring Monitoring your blood glucose (also know as blood sugar) helps you to manage your diabetes. It also helps you and your health care provider monitor your diabetes and determine how well your treatment plan is working. WHY SHOULD YOU MONITOR YOUR BLOOD GLUCOSE?  It can help you understand how food, exercise, and medicine affect your blood glucose.  It allows you to know what your blood glucose is at any given moment. You can quickly tell if you are having low blood glucose (hypoglycemia) or high blood glucose (hyperglycemia).  It can help you and your health care provider know how to adjust your medicines.  It can help you understand how to manage an illness or adjust medicine for exercise. WHEN SHOULD YOU TEST? Your health care provider will help you decide how often you should check your blood glucose. This may depend on the type of diabetes you have, your diabetes control, or the types of medicines you are taking. Be sure to write down all of your blood glucose readings so that this information can be reviewed with your health care provider. See below for examples of testing times that your health care provider may suggest. Type 1 Diabetes  Test 4 times a day if you are in good control, using an insulin pump, or perform multiple daily injections.  If your diabetes is not well controlled or if you are sick, you may need to monitor more often.  It is a good idea to also monitor:  Before and after exercise.  Between meals and 2 hours after a meal.  Occasionally between 2:00 a.m. and 3:00 a.m. Type 2 Diabetes  It can vary with each person, but generally, if you are on insulin, test 4 times a day.  If you take medicines by mouth (orally), test 2 times a day.  If you are on a controlled diet, test once a day.  If your diabetes is not well controlled or if you are sick, you may need to monitor more often. HOW TO MONITOR  YOUR BLOOD GLUCOSE Supplies Needed  Blood glucose meter.  Test strips for your meter. Each meter has its own strips. You must use the strips that go with your own meter.  A pricking needle (lancet).  A device that holds the lancet (lancing device).  A journal or log book to write down your results. Procedure  Wash your hands with soap and water. Alcohol is not preferred.  Prick the side of your finger (not the tip) with the lancet.  Gently milk the finger until a small drop of blood appears.  Follow the instructions that come with your meter for inserting the test strip, applying blood to the strip, and using your blood glucose meter. Other Areas to Get Blood for Testing Some meters allow you to use other areas of your body (other than your finger) to test your blood. These areas are called alternative sites. The most common alternative sites are:  The forearm.  The thigh.  The back area of the lower leg.  The palm of the hand. The blood flow in these areas is slower. Therefore, the blood glucose values you get may be delayed, and the numbers are different from what you would get from your fingers. Do not use alternative sites if you think you are having hypoglycemia. Your reading will not be accurate. Always use a finger if you are having hypoglycemia. Also, if you cannot feel your lows (  hypoglycemia unawareness), always use your fingers for your blood glucose checks. ADDITIONAL TIPS FOR GLUCOSE MONITORING  Do not reuse lancets.  Always carry your supplies with you.  All blood glucose meters have a 24-hour "hotline" number to call if you have questions or need help.  Adjust (calibrate) your blood glucose meter with a control solution after finishing a few boxes of strips. BLOOD GLUCOSE RECORD KEEPING It is a good idea to keep a daily record or log of your blood glucose readings. Most glucose meters, if not all, keep your glucose records stored in the meter. Some meters come  with the ability to download your records to your home computer. Keeping a record of your blood glucose readings is especially helpful if you are wanting to look for patterns. Make notes to go along with the blood glucose readings because you might forget what happened at that exact time. Keeping good records helps you and your health care provider to work together to achieve good diabetes management.  Document Released: 08/27/2003 Document Revised: 01/08/2014 Document Reviewed: 01/16/2013 [facility] Patient Information 2015 [place], [place]. This information is not intended to replace advice given to you by your health care provider. Make sure you discuss any questions you have with your health care provider.

## 2015-06-05 ENCOUNTER — Inpatient Hospital Stay (HOSPITAL_COMMUNITY): Payer: Medicare Other

## 2015-06-05 LAB — BASIC METABOLIC PANEL
ANION GAP: 7 (ref 5–15)
BUN: 27 mg/dL — ABNORMAL HIGH (ref 6–20)
CALCIUM: 9.4 mg/dL (ref 8.9–10.3)
CO2: 17 mmol/L — AB (ref 22–32)
CREATININE: 1.45 mg/dL — AB (ref 0.44–1.00)
Chloride: 112 mmol/L — ABNORMAL HIGH (ref 101–111)
GFR, EST AFRICAN AMERICAN: 48 mL/min — AB (ref >60)
GFR, EST NON AFRICAN AMERICAN: 42 mL/min — AB (ref >60)
GLUCOSE: 100 mg/dL — AB (ref 65–99)
Potassium: 4.2 mmol/L (ref 3.5–5.1)
Sodium: 136 mmol/L (ref 135–145)

## 2015-06-05 LAB — GLUCOSE, CAPILLARY
GLUCOSE-CAPILLARY: 86 mg/dL (ref 65–99)
GLUCOSE-CAPILLARY: 174 mg/dL — AB (ref 65–99)
Glucose-Capillary: 111 mg/dL — ABNORMAL HIGH (ref 65–99)
Glucose-Capillary: 184 mg/dL — ABNORMAL HIGH (ref 65–99)

## 2015-06-05 NOTE — Progress Notes (Signed)
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 She had a plantar ulcer for ?almost 3 months but for the last 10-14 days has been painful and red. She went [facility] ED on 9/15 and was given oral clindamycin without event for home health nurse and recommended for one care clinic follow-up. She didn't noticed another ulcer at the base of the right third toe yesterday. Also noted some swelling of her right foot. She had nausea with 2 episodes of vomiting but no fevers or chills. She also had several episodes of diarrhea on the day of admission. In the ED patient was afebrile with normal WBC. X-ray of the foot was negative for osteomyelitis. Patient admitted with concern for right foot ulcer and osteomyelitis with failed outpatient therapy. MRI of the foot done showing osteomyelitis of the distal third metatarsal and remnant of the proximal phalanx of the third toe with possible abscess and osteomyelitis of distal proximal phalanx of fifth toe.    Assessment/Plan: Acute osteomyelitis with a right foot abscess of the right third and fifth toes with foot ulcer -Contributed by uncontrolled diabetes. Patient has had multiple toe amputations done and followed by Dr. [name] in the past.  -Dr. [name] took patient to OR with I&D of right foot including sharp excisional debridement of skin, soft tissue, fascia, tendons and bone with third ray dissection. Dressing applied. Continue pain medications when necessary.  -PT and recommends home health.   Uncontrolled type 1  diabetes mellitus A1c of 11.6 in June. Very poorly controlled with multiple hospitalization for DKA (5 hospitalizations and past 1 year). Patient reports taking NPH 70/30, 30 units twice a day which she buys at [place]. She reports she does not she a PCP or an endocrinologist for her diabetes. Her PCP is listed as Dr.colodonato who follows her for her kidney issue and last saw him 8 months back.. On reviewing prior hospitalizations there were discussions about sending her to [facility] but that never happened. -Patient reports that her blood glucose are usually in the range of 300-500. -Diabetic coordinator consulted.  appointment scheduled at the [facility] for next week.Maryclare Labrador continue to hold NPH giving her CBGs within normal limits, continue with insulin sliding scale.   acute on chronic disease stage III - Possibly secondary to dehydration and infection. Improving with IV hydration.  Avoid nephrotoxins.  - Improving with IV fluids , baseline creatinine 1.5, peaked at 2.3 , significantly improved with IV fluids .  History of renal transplant  - Continue Prograf, CellCept and prednisone. Check Prograf level.  -Patient sees Dr.Colodonato. Last seen in January .  Dr. Vaughan Sine office to schedule an appointment. was notified by the staff that patient has a habit of skipping appointments. Missed her appointment in June. Have scheduled an appointment in 2 weeks. Counseled strongly on compliance with outpatient follow-up.   Hypothyroidism Continue Synthroid. TSH 2.01  Hypotension on 9 /27 - resolved with IV fluid .  GERD  Continue Pepcid.  Diarrhea Possibly associated with antibiotics. Now resolved. C. difficile negative.  Diet: Diabetic DVT prophylaxis: Subcutaneous heparin   Code Status:  Full code Family Communication: none at bedside Disposition Plan: Discharge held as patient became hypotensive and hypoglycemic. Home possibly tomorrow if  stable.   Consultants:  Dr. [name] (orthopedics)  Procedures:  MRI right foot  Antibiotics: IV vancomycin and Zosyn 9/24-9/26  HPI/Subjective: Patient seen and examined. No overnight issues.   Objective: Filed Vitals:   06/05/15 1508  BP: 136/49  Pulse: 76  Temp: 97.9 F (36.6 C)  Resp:     Intake/Output Summary (Last 24 hours) at 06/05/15 1548 Last data filed at 06/05/15 1416  Gross per 24 hour  Intake 1464.92 ml  Output      0 ml  Net 1464.92 ml   Filed Weights   06/01/15 1706 06/02/15 0000  Weight: 96.163 kg (212 lb) 96.7 kg (213 lb 3 oz)    Exam:   General:   no acute distress  HEENT: moist oral mucosa, supple neck  Chest: Clear to auscultation bilaterally  CVS: Normal S1 and S2, no murmurs rub or gallop  Chest: Soft, nondistended, nontender, pulses present  Musculoskeletal: No edema, dressing over right foot  CNS: Alert and oriented  Data Reviewed: Basic Metabolic Panel:  Recent Labs Lab 06/01/15 1721 06/02/15 0645 06/03/15 0316 06/04/15 0846 06/05/15 1040  NA 140 136 137 136 136  K 4.7 4.7 4.5 4.2 4.2  CL 105 103 107 107 112*  CO2  --  21* 20* 21* 17*  GLUCOSE 115* 244* 205* 159* 100*  BUN 36* 35* 40* 35* 27*  CREATININE 1.40* 2.10* 2.30* 1.70* 1.45*  CALCIUM  --  9.8 9.6 9.6 9.4   Liver Function Tests:  Recent Labs Lab 06/02/15 0645  AST 20  ALT 15  ALKPHOS 70  BILITOT 1.1  PROT 6.3*  ALBUMIN 2.8*   No results for input(s): LIPASE, AMYLASE in the last 168 hours. No results for input(s): AMMONIA in the last 168 hours. CBC:  Recent Labs Lab 06/01/15 1710 06/01/15 1721 06/02/15 0645 06/03/15 0316  WBC 6.7  --  11.7* 9.5  NEUTROABS 4.5  --   --   --   HGB 12.3 13.9 12.5 12.5  HCT 38.8 41.0 39.2 39.3  MCV 90.0  --  91.6 90.3  PLT 336  --  275 264   Cardiac Enzymes: No results for input(s): CKTOTAL, CKMB, CKMBINDEX, TROPONINI in the last 168 hours. BNP (last 3 results) No results for input(s): BNP in the  last 8760 hours.  ProBNP (last 3 results) No results for input(s): PROBNP in the last 8760 hours.  CBG:  Recent Labs Lab 06/04/15 1441 06/04/15 1732 06/04/15 2141 06/05/15 0746 06/05/15 1153  GLUCAP 91 101* 110* 86 111*    Recent Results (from the past 240 hour(s))  Culture, blood (single)     Status: None (Preliminary result)   Collection Time: 06/01/15 10:37 PM  Result Value Ref Range Status   Specimen Description BLOOD RIGHT FOREARM  Final   Special Requests BOTTLES DRAWN AEROBIC AND ANAEROBIC 5CC  Final   Culture NO GROWTH 4 DAYS  Final   Report Status PENDING  Incomplete  MRSA PCR Screening     Status: Abnormal   Collection Time: 06/02/15 12:10 AM  Result Value Ref Range Status   MRSA by PCR POSITIVE (A) NEGATIVE Final    Comment:        The GeneXpert MRSA Assay (FDA approved for NASAL specimens only), is one component of a comprehensive MRSA colonization surveillance program. It is not intended to diagnose MRSA infection nor  to guide or monitor treatment for MRSA infections. RESULT CALLED TO, READ BACK BY AND VERIFIED WITH: M [email or web address]  06/02/15 MKELLY   C difficile quick scan w PCR reflex     Status: None   Collection Time: 06/02/15  1:00 AM  Result Value Ref Range Status   C Diff antigen NEGATIVE NEGATIVE Final   C Diff toxin NEGATIVE NEGATIVE Final   C Diff interpretation Negative for toxigenic C. difficile  Final  Culture, blood (single)     Status: None (Preliminary result)   Collection Time: 06/02/15  1:55 AM  Result Value Ref Range Status   Specimen Description BLOOD LEFT HAND  Final   Special Requests BOTTLES DRAWN AEROBIC ONLY 5CC  Final   Culture NO GROWTH 3 DAYS  Final   Report Status PENDING  Incomplete  Surgical pcr screen     Status: Abnormal   Collection Time: 06/02/15  2:05 PM  Result Value Ref Range Status   MRSA, PCR NEGATIVE NEGATIVE Final   Staphylococcus aureus POSITIVE (A) NEGATIVE Final    Comment:        The Xpert SA Assay  (FDA approved for NASAL specimens in patients over 70 years of age), is one component of a comprehensive surveillance program.  Test performance has been validated by [facility] for patients greater than or equal to 69 year old. It is not intended to diagnose infection nor to guide or monitor treatment.      Studies: US Renal Transplant W/doppler  06/05/2015   CLINICAL DATA:  Renal transplant, acute renal failure, hypertension  EXAM: ULTRASOUND OF RENAL TRANSPLANT WITH DOPPLER  TECHNIQUE: Ultrasound examination of the renal transplant was performed with gray-scale, color and duplex doppler evaluation.  COMPARISON:  06/03/2015, 05/05/2014  FINDINGS: Transplant kidney location:  RIGHT iliac fossa  Transplant kidney:  Length: 14.1 cm. Normal morphology. No mass or hydronephrosis. No shadowing calcification.  Color flow in the main renal artery:  Present  Color flow in the main renal vein:  Present  Duplex Doppler Evaluation  Resistive index in main renal artery: 0.77  Venous waveform in main renal vein:  Present  Resistive index in upper pole intrarenal artery: 0.71  Resistive index in lower pole intrarenal artery: 0.82  Bladder: Decompressed, postvoid, unable to evaluate.  Other findings:  No peritransplant fluid collection.  IMPRESSION: Normal sonographic appearance of transplant kidney in RIGHT iliac fossa without mass or hydronephrosis.  Resistive indices within the main renal artery and at the upper and lower pole intrarenal arteries are little changed from the previous exam.   Electronically Signed   By: Ulyses Southward M.D.   On: 06/05/2015 10:13   US Renal  06/03/2015   CLINICAL DATA:  "Acute kidney injury" . Right lower quadrant renal transplant.  EXAM: RENAL / URINARY TRACT ULTRASOUND COMPLETE  COMPARISON:  05/05/2014  FINDINGS: Right Kidney:  Length: 10.0 cm. Increased cortical echogenicity and decreased cortical thickness. No hydronephrosis.  Left Kidney:  Length: 8.7 cm. Moderate renal  atrophy. Increased echogenicity. No hydronephrosis.  Right lower quadrant renal transplant is within normal limits, measuring 13.3 cm in greatest dimension. No hydronephrosis or perinephric fluid collection.  Bladder:  Appears normal for degree of bladder distention.  IMPRESSION: 1. Normal appearance of the right lower quadrant renal transplant. 2. Expected atrophy and increased cortical echogenicity involving the native kidneys.   Electronically Signed   By: Jeronimo Greaves M.D.   On: 06/03/2015 17:42    Scheduled Meds: . famotidine  20 mg  Oral Daily  . feeding supplement (GLUCERNA SHAKE)  237 mL Oral TID BM  . heparin  5,000 Units Subcutaneous 3 times per day  . insulin aspart  0-15 Units Subcutaneous TID WC  . levothyroxine  137 mcg Oral QPM  . mupirocin ointment   Nasal BID  . mycophenolate  500 mg Oral BID  . orphenadrine  100 mg Oral BID  . predniSONE  5 mg Oral Daily  . sodium chloride  3 mL Intravenous Q12H  . tacrolimus  1 mg Oral BID   Continuous Infusions: . sodium chloride 1,000 mL (06/05/15 1545)      Time spent: 25 minutes    ELGERGAWY, DAWOOD  [facility] Pager [phone]. If 7PM-7AM, please contact night-coverage at [email or web address], password [facility] 06/05/2015, 3:48 PM  LOS: 4 days

## 2015-06-05 NOTE — Progress Notes (Signed)
Physical Therapy Treatment Patient Details Name: [name] MRN: [id number] DOB: 1965/12/14 Today's Date: 06/05/2015    History of Present Illness Pt admitted with R foot abscess with 3rd ray osteomyelitis, s/p I&D R foot 06/02/15.  PMH: uncontrolled DM, renal transplant HTN, previous toe amputations.    PT Comments    Pt stating that her car can get close to her side door which only has 1 step.  Discussed entering her home this way vs. The front door with 4 steps with 2 rails.  Educated on technique and demonstrated in room, but pt did not attempt. Pt left in bathroom with call bell in reach and nursing aware. Con't to recommend HHPT.  Follow Up Recommendations  Home health PT     Equipment Recommendations  Other (comment) (equipment has arrived to pt's room)    Recommendations for Other Services       Precautions / Restrictions Precautions Precautions: Fall Required Braces or Orthoses: Other Brace/Splint Other Brace/Splint: Darco shoe Restrictions Other Position/Activity Restrictions: DARCO shoe R foot, weight on heel only    Mobility  Bed Mobility Overal bed mobility: Modified Independent       Supine to sit: Modified independent (Device/Increase time)        Transfers Overall transfer level: Needs assistance Equipment used: Rolling walker (2 wheeled) Transfers: Sit to/from Stand Sit to Stand: Supervision         General transfer comment: cues for hand placement.  Ambulation/Gait Ambulation/Gait assistance: Min guard Ambulation Distance (Feet): 42 Feet Assistive device: Rolling walker (2 wheeled) Gait Pattern/deviations: Decreased stance time - right;Trunk flexed Gait velocity: decreased Gait velocity interpretation: Below normal speed for age/gender General Gait Details: Pt fatigues quickly, but able to walk a little bit further today.  Pt needing cues for step length.   Stairs Stairs:  (Verbally reviewed)          Wheelchair Mobility     Modified Rankin (Stroke Patients Only)       Balance     Sitting balance-Leahy Scale: Good       Standing balance-Leahy Scale: Fair                      Cognition Arousal/Alertness: Awake/alert Behavior During Therapy: WFL for tasks assessed/performed Overall Cognitive Status: Within Functional Limits for tasks assessed Area of Impairment: Problem solving     Memory: Decreased recall of precautions       Problem Solving: Requires verbal cues;Slow processing General Comments: Pt required cueing to remember she needed to wear Darco shoe when up    Exercises General Exercises - Lower Extremity Hip Flexion/Marching: Both;Standing;5 reps    General Comments        Pertinent Vitals/Pain Pain Assessment: 0-10 Pain Score: 6  Pain Location: R thigh after gait Pain Descriptors / Indicators: Aching Pain Intervention(s): Repositioned    Home Living                      Prior Function            PT Goals (current goals can now be found in the care plan section) Acute Rehab PT Goals Patient Stated Goal: Return home PT Goal Formulation: With patient Time For Goal Achievement: 06/11/15 Potential to Achieve Goals: Good Progress towards PT goals: Progressing toward goals    Frequency  Min 3X/week    PT Plan Current plan remains appropriate    Co-evaluation  End of Session Equipment Utilized During Treatment: Gait belt;Other (comment) (DARCO shoe) Activity Tolerance: Patient tolerated treatment well Patient left: Other (comment);with call bell/phone within reach (in bathroom with call light in reach and nursing aware)     Time: 1100-1118 PT Time Calculation (min) (ACUTE ONLY): 18 min  Charges:  $Gait Training: 8-22 mins                    G Codes:      SMITH,KAREN LUBECK 06/05/2015, 1:32 PM

## 2015-06-06 LAB — GLUCOSE, CAPILLARY
GLUCOSE-CAPILLARY: 190 mg/dL — AB (ref 65–99)
GLUCOSE-CAPILLARY: 210 mg/dL — AB (ref 65–99)
Glucose-Capillary: 205 mg/dL — ABNORMAL HIGH (ref 65–99)

## 2015-06-06 LAB — BASIC METABOLIC PANEL
ANION GAP: 8 (ref 5–15)
BUN: 19 mg/dL (ref 6–20)
CHLORIDE: 107 mmol/L (ref 101–111)
CO2: 18 mmol/L — AB (ref 22–32)
Calcium: 9.5 mg/dL (ref 8.9–10.3)
Creatinine, Ser: 1.31 mg/dL — ABNORMAL HIGH (ref 0.44–1.00)
GFR calc non Af Amer: 47 mL/min — ABNORMAL LOW (ref >60)
GFR, EST AFRICAN AMERICAN: 55 mL/min — AB (ref >60)
Glucose, Bld: 230 mg/dL — ABNORMAL HIGH (ref 65–99)
Potassium: 4.9 mmol/L (ref 3.5–5.1)
Sodium: 133 mmol/L — ABNORMAL LOW (ref 135–145)

## 2015-06-06 LAB — CULTURE, BLOOD (SINGLE): Culture: NO GROWTH

## 2015-06-06 MED ORDER — INSULIN NPH ISOPHANE & REGULAR (70-30) 100 UNIT/ML ~~LOC~~ SUSP: 15.0000 [IU] | Freq: Two times a day (BID) | SUBCUTANEOUS | Status: DC

## 2015-06-06 NOTE — Progress Notes (Signed)
Pharmacist Provided - Patient Medication Education Prior to Discharge   [name] is an 49 y.o. female who presented to [facility] on 06/01/2015 with a chief complaint of  Chief Complaint  Patient presents with  . Foot Ulcer      Patient will be discharged with 3 new medications  Patient being discharged without any new medications  The following medications were discussed with the patient: Glucerna, mupirocin ointment, oxycodone, insulin  Pain Control medications:  Yes     No  Diabetes Medications:  Yes     No  Heart Failure Medications:  Yes     No  Anticoagulation Medications:   Yes     No  Antibiotics at discharge:  Yes     No  Allergy Assessment Completed and Updated:  Yes     No Identified Patient Allergies:  Allergies  Allergen Reactions  . Ace Inhibitors Cough    Lisinopril  . Adhesive [Tape] Itching     Medication Adherence Assessment:  Excellent (no doses missed/week)       Good (1 dose missed/week)       Partial (2-3 doses missed/week)       Poor (>3 doses missed/week)  Barriers to Obtaining Medications:  Yes  No  [name] expressed concern with obtaining medication due to transportation issues. Frequently has to wait for relative to drive her to pharmacy.  Assessment: Discussed new prescriptions with patient. Educated patient on potential adverse effects; especially respiratory depression with opiates. Pt advised to adhere rigidly to instructions for use.   Time spent preparing for discharge counseling: 10 min Time spent counseling patient: 15 min  Reuel Derby, PharmD 06/06/2015, 5:25 PM

## 2015-06-06 NOTE — Care Management Note (Signed)
Case Management Note  Patient Details  Name: [name] MRN: [id number] Date of Birth: [birth year]  Subjective/Objective:      Patient is for dc today, she has DME in her room, [facility] has been notified.  Patient states that she has transport home today but it may be at 5 pm.  She has no problems getting her medication,  NCM gave her a brochure for her follow up appt at [facility] clinic.                Action/Plan:   Expected Discharge Date:                  Expected Discharge Plan:  Home w Home Health Services  In-House Referral:  Clinical Social Work  Discharge planning Services  CM Consult  Post Acute Care Choice:  Durable Medical Equipment, Home Health, Resumption of Svcs/PTA Provider Choice offered to:  Patient  DME Arranged:  Bedside commode, Walker rolling DME Agency:  [facility]  HH Arranged:  RN, PT, OT, Social Work [employer] Agency:  [facility]  Status of Service:  Completed, signed off  Medicare Important Message Given:  Yes-second notification given Date Medicare IM Given:    Medicare IM give by:    Date Additional Medicare IM Given:    Additional Medicare Important Message give by:     If discussed at Long Length of Stay Meetings, dates discussed:    Additional Comments:  Leone Haven, RN 06/06/2015, 11:08 AM

## 2015-06-06 NOTE — Progress Notes (Signed)
Patient discharged to home with family. Pt. Escorted off unit by Boneta Lucks, CNA. IV removed. Discharge instructions reviewed with patient and follow ups. Pt. Provided with prescriptions and encouraged to return to nearest ER if she fever, increased pain, redness around incision, bleeding, or drainage occurs, or any other untoward symptoms occur.

## 2015-06-06 NOTE — Discharge Summary (Addendum)
[name], is a 49 y.o. female  DOB [birth year]  MRN [id number].  Admission date:  06/01/2015  Admitting Physician  Lorretta Harp, MD  Discharge Date:  06/06/2015   Primary MD  [name], MD  Recommendations for primary care physician for things to follow:  - Please check labs including CBC, BMP during next visit to ensure stabilization of renal function. - Patient has scheduled follow-up appointment at [facility] clinic on 10/3,  monitor her CBGs, and adjust insulin as needed, her dose has been decreased giving hypoglycemic during hospital stay.  Admission Diagnosis  Diabetic foot infection [E11.69, L08.9]   Discharge Diagnosis  Diabetic foot infection [E11.69, L08.9]    Principal Problem:   Diabetic osteomyelitis Active Problems:   Immunosuppression   Hypothyroidism   H/O kidney transplant   Diabetic foot infection   Essential hypertension   GERD (gastroesophageal reflux disease)   Diarrhea   Type 1 diabetes mellitus with diabetic foot ulcer   Acute renal failure superimposed on stage 3 chronic kidney disease   Foot ulcer, right   Foot abscess, right   Uncontrolled type 1 diabetes mellitus with foot ulcer   History of renal transplantation      Past Medical History  Diagnosis Date  . Hypertension   . Hypothyroid   . Depression   . 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 Kidney disease   . End stage renal disease     right arm AV graft, post transplant  . Diabetes mellitus     Insulin dependant    Past Surgical History  Procedure Laterality Date  . Back surgery  2001  . Tubal ligation    . Dg av dialysis graft declot or    . Nephrectomy transplanted organ    . Kidney transplant  December 16, 2009  . 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  . Cataract extraction w/ intraocular lens implant Right   . Amputation Left 05/11/2014    Procedure: AMPUTATION LEFT  GREAT TOE;  Surgeon: Kathryne Hitch, MD;  Location: [facility] ORS;  Service: Orthopedics;  Laterality: Left;  . Incision and drainage Right 06/02/2015    Procedure: INCISION AND DRAINAGE OF RIGHT 3rd RAY RESECTION;  Surgeon: Kathryne Hitch, MD;  Location: [facility] OR;  Service: Orthopedics;  Laterality: Right;       History of present illness and  Hospital Course:     Kindly see H&P for history of present illness and admission details, please review complete Labs, Consult reports and Test reports for all details in brief  HPI  from the history and physical done on the day of admission  [name] is a 49 y.o. 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  Patient reports that she has a ulcer over the plantar aspects of right foot, which has been going on for about 3 months.  It is painful and red. She was seen at [facility] on 05/23/15. She was given oral Keflex, and arranged home health nurse visit and recommended wound clinic treatment. She states that she noticed second ulceration to top of her foot at the base of third toe (first three toes amputated) yesterday. She states it has been popped and now has pink colored drainage from wound. It is very painful. The R foot is swelling. She has nausea, and vomited twice today. She does not have fever or chills. She state thats she had mild diarrhea since. No abdominal pain, symptoms of UTI, rashes, unilateral weakness.  In ED, patient was found to have WBC 6.7, temperature normal, no tachycardia, stable renal function. X-ray of her right foot did not show osteomyelitis.   Hospital Course   Acute osteomyelitis with a right foot abscess of the right third and fifth toes with foot ulcer -Contributed by uncontrolled diabetes. Patient has had multiple toe amputations done and followed by Dr.  [name] in the past.  -Dr. [name] took patient to OR with I&D of right foot including sharp excisional debridement of skin, soft tissue, fascia, tendons and bone with third ray dissection. Dressing applied. Continue pain medications when necessary. To follow with him in the clinic next week regarding wound check.    Uncontrolled type 1 diabetes mellitus A1c of 11.6 in June. Very poorly controlled with multiple hospitalization for DKA (5 hospitalizations and past 1 year). Patient reports taking NPH 70/30, 30 units twice a day which she buys at [place]. She reports she does not she a PCP or an endocrinologist for her diabetes. Her PCP is listed as Dr.colodonato who follows her for her kidney issue and last saw him 8 months back.. On reviewing prior hospitalizations there were discussions about sending her to [facility] but that never happened. -Patient reports that her blood glucose are usually in the range of 300-500. -Diabetic coordinator consulted. appointment scheduled at the [facility] for next week.. -NPH dose was adjusted during hospital stay, and actually was held secondary to episode of hypoglycemia, will resume at 15 units twice a day on discharge, patient was increased instructed to increase dose gradually to her baseline of 30 units twice a day if her CBGs remain uncontrolled.    acute on chronic disease stage III - Possibly secondary to dehydration and infection. Improving with IV hydration. Avoid nephrotoxins.  - Improving with IV fluids , baseline creatinine 1.5, peaked at 2.3 , significantly improved with IV fluids .  History of renal transplant  - Continue Prograf, CellCept and prednisone.  -Patient sees Dr.Colodonato. Last seen in January . Dr. Vaughan Sine office to schedule an appointment. was notified by the staff that patient has a habit of skipping appointments. Missed her appointment in June. Have scheduled an appointment in 2  weeks. Counseled strongly on compliance with outpatient follow-up.   Hypothyroidism Continue Synthroid. TSH 2.01  Hypotension on 9 /27 - resolved with IV fluid .  GERD  Continue Pepcid.  Diarrhea Possibly associated with antibiotics. Now resolved. C. difficile negative.    Discharge Condition:  stable   Follow UP  Follow-up Information    Follow up with [facility]     On 06/10/2015.   Why:  2:30 for hospital follow up   Contact information:   [address] [place] [address] [phone]      Follow up with Kathryne Hitch, MD. Schedule an appointment as soon as  possible for a visit in 1 week.   Specialty:  Orthopedic Surgery   Contact information:   [address] [place] [address] [phone]       Follow up with [name], MD On 06/13/2015.   Specialty:  Nephrology   Why:  10:45 am   Contact information:   [address] [place] [address] [phone]       Follow up with [facility].   Why:  rolling walker and bedside commode   Contact information:   [address] [place] [address] [phone]       Follow up with [facility].   Why:  HHRN, HHPT, Social Worker   Contact information:   [address] [place] [address] [phone]       Follow up with Kathryne Hitch, MD. Schedule an appointment as soon as possible for a visit in 1 week.   Specialty:  Orthopedic Surgery   Contact information:   [address] [place] [address] [phone]       Follow up with COLADONATO,JOSEPH A, MD. Call in 2 weeks.   Specialty:  Nephrology   Why:  Posthospitalization follow-up   Contact information:   [address] [place] [address] [phone]         Discharge Instructions  and  Discharge Medications         Discharge Instructions    Diet - low sodium heart healthy    Complete  by:  As directed      Discharge instructions    Complete by:  As directed   Follow with Primary MD COLADONATO,JOSEPH A, MD in 7 days   Get CBC, CMP, 2 view Chest X ray checked  by Primary MD next visit.    Activity: As tolerated with Full fall precautions use walker/cane & assistance as needed   Disposition Home **   Diet: Heart Healthy  , with feeding assistance and aspiration precautions.  For Heart failure patients - Check your Weight same time everyday, if you gain over 2 pounds, or you develop in leg swelling, experience more shortness of breath or chest pain, call your Primary MD immediately. Follow Cardiac Low Salt Diet and 1.5 lit/day fluid restriction.   On your next visit with your primary care physician please Get Medicines reviewed and adjusted.   Please request your Prim.MD to go over all Hospital Tests and Procedure/Radiological results at the follow up, please get all Hospital records sent to your Prim MD by signing hospital release before you go home.   If you experience worsening of your admission symptoms, develop shortness of breath, life threatening emergency, suicidal or homicidal thoughts you must seek medical attention immediately by calling 911 or calling your MD immediately  if symptoms less severe.  You Must read complete instructions/literature along with all the possible adverse reactions/side effects for all the Medicines you take and that have been prescribed to you. Take any new Medicines after you have completely understood and accpet all the possible adverse reactions/side effects.   Do not drive, operating heavy machinery, perform activities at heights, swimming or participation in water activities or provide baby sitting services if your were admitted for syncope or siezures until you have seen by Primary MD or a Neurologist and advised to do so again.  Do not drive when taking Pain medications.    Do not take more than prescribed Pain, Sleep and  Anxiety Medications  Special Instructions: If you have smoked or chewed Tobacco  in the last 2 yrs please stop smoking, stop any regular Alcohol  and or any Recreational drug use.  Wear Seat belts while driving.   Please note  You were cared for by a hospitalist during your hospital stay. If you have any questions about your discharge medications or the care you received while you were in the hospital after you are discharged, you can call the unit and asked to speak with the hospitalist on call if the hospitalist that took care of you is not available. Once you are discharged, your primary care physician will handle any further medical issues. Please note that NO REFILLS for any discharge medications will be authorized once you are discharged, as it is imperative that you return to your primary care physician (or establish a relationship with a primary care physician if you do not have one) for your aftercare needs so that they can reassess your need for medications and monitor your lab values.     Increase activity slowly    Complete by:  As directed             Medication List    STOP taking these medications        orphenadrine 100 MG tablet  Commonly known as:  NORFLEX      TAKE these medications        acetaminophen 500 MG tablet  Commonly known as:  TYLENOL  Take 500-1,000 mg by mouth every 6 (six) hours as needed for headache.     amLODipine 5 MG tablet  Commonly known as:  NORVASC  Take 1 tablet (5 mg total) by mouth daily.     carvedilol 25 MG tablet  Commonly known as:  COREG  Take 2 tablets (50 mg total) by mouth 2 (two) times daily.     feeding supplement (GLUCERNA SHAKE) Liqd  Take 237 mLs by mouth 3 (three) times daily between meals.     insulin NPH-regular Human (70-30) 100 UNIT/ML injection  Commonly known as:  NOVOLIN 70/30  Inject 15 Units into the skin 2 (two) times daily with a meal.     levothyroxine 137 MCG tablet  Commonly known as:  SYNTHROID,  LEVOTHROID  Take 1 tablet (137 mcg total) by mouth daily. BRAND NAME ONLY     mupirocin ointment 2 %  Commonly known as:  BACTROBAN  Place into the nose 2 (two) times daily.     mycophenolate 250 MG capsule  Commonly known as:  CELLCEPT  Take 500 mg by mouth 2 (two) times daily.     oxyCODONE 5 MG immediate release tablet  Commonly known as:  Oxy IR/ROXICODONE  Take 1 tablet (5 mg total) by mouth every 6 (six) hours as needed for moderate pain.     predniSONE 5 MG tablet  Commonly known as:  DELTASONE  Take 5 mg by mouth daily.     ranitidine 150 MG capsule  Commonly known as:  ZANTAC  Take 150 mg by mouth daily as needed for heartburn.     tacrolimus 1 MG capsule  Commonly known as:  PROGRAF  Take 1 mg by mouth 2 (two) times daily.          Diet and Activity recommendation: See Discharge Instructions above   Consults obtained -   Dr. [name] (orthopedics)  Major procedures and Radiology Reports - PLEASE review detailed and final reports for all details, in brief -  I&D of right foot including sharp excisional debridement of skin, soft tissue, fascia, tendons and bone with third ray dissection by Dr. [name].  US Renal Transplant W/doppler  06/05/2015   CLINICAL DATA:  Renal transplant, acute renal failure, hypertension  EXAM: ULTRASOUND OF RENAL TRANSPLANT WITH DOPPLER  TECHNIQUE: Ultrasound examination of the renal transplant was performed with gray-scale, color and duplex doppler evaluation.  COMPARISON:  06/03/2015, 05/05/2014  FINDINGS: Transplant kidney location:  RIGHT iliac fossa  Transplant kidney:  Length: 14.1 cm. Normal morphology. No mass or hydronephrosis. No shadowing calcification.  Color flow in the main renal artery:  Present  Color flow in the main renal vein:  Present  Duplex Doppler Evaluation  Resistive index in main renal artery: 0.77  Venous waveform in main renal vein:  Present  Resistive index in upper pole intrarenal artery: 0.71  Resistive  index in lower pole intrarenal artery: 0.82  Bladder: Decompressed, postvoid, unable to evaluate.  Other findings:  No peritransplant fluid collection.  IMPRESSION: Normal sonographic appearance of transplant kidney in RIGHT iliac fossa without mass or hydronephrosis.  Resistive indices within the main renal artery and at the upper and lower pole intrarenal arteries are little changed from the previous exam.   Electronically Signed   By: Ulyses Southward M.D.   On: 06/05/2015 10:13   US Renal  06/03/2015   CLINICAL DATA:  "Acute kidney injury" . Right lower quadrant renal transplant.  EXAM: RENAL / URINARY TRACT ULTRASOUND COMPLETE  COMPARISON:  05/05/2014  FINDINGS: Right Kidney:  Length: 10.0 cm. Increased cortical echogenicity and decreased cortical thickness. No hydronephrosis.  Left Kidney:  Length: 8.7 cm. Moderate renal atrophy. Increased echogenicity. No hydronephrosis.  Right lower quadrant renal transplant is within normal limits, measuring 13.3 cm in greatest dimension. No hydronephrosis or perinephric fluid collection.  Bladder:  Appears normal for degree of bladder distention.  IMPRESSION: 1. Normal appearance of the right lower quadrant renal transplant. 2. Expected atrophy and increased cortical echogenicity involving the native kidneys.   Electronically Signed   By: Jeronimo Greaves M.D.   On: 06/03/2015 17:42   Mr Foot Right Wo Contrast  06/02/2015   CLINICAL DATA:  Diabetic patient with an ulceration of the distal right third ray which developed 3 days ago. A second ulcer on the right foot developed yesterday. History of prior amputation.  EXAM: MRI OF THE RIGHT FOREFOOT WITHOUT CONTRAST  TECHNIQUE: Multiplanar, multisequence MR imaging was performed. No intravenous contrast was administered.  COMPARISON:  Plain films of the right foot 06/01/2015.  FINDINGS: As seen on the comparison plain films, the patient is status post amputation of the second ray at the level of the distal diaphysis. There is  also amputation of the third toe at the level of the base of the proximal phalanx. There is edema in the remnant of the proximal phalanx of the third toe and in the distal 1.5 cm of the third metatarsal compatible with osteomyelitis. There appears to be a skin ulceration over the dorsal aspect of the third toe remnant. Although lack of IV contrast somewhat limits evaluation for abscess, there appears to be focal fluid measuring 1.6 cm transverse by 1.0 cm craniocaudal by 1.1 cm long at the site of the ulceration worrisome for abscess.  There is marrow edema in the distal 1 cm of the proximal phalanx of the little toe with corresponding decreased T1 signal worrisome for osteomyelitis. No associated abscess is identified.  Degenerative early neuropathic change is seen about  the midfoot most notable at the second and third tarsometatarsal joints where there is subchondral cyst formation. Bone marrow signal is otherwise unremarkable. No mass is identified. Intrinsic musculature the foot is markedly atrophied.  IMPRESSION: Findings consistent with osteomyelitis in the distal 1.5 cm of the third metatarsal and remnant of the proximal phalanx of the third toe. There may be an associated abscess at the site of a skin ulceration on the dorsal aspect of the third toe remnant.  Marrow edema in the distal 1 cm of the proximal phalanx of the fifth toe worrisome for osteomyelitis. No associated abscess is identified.  Degenerative versus early neuropathic change midfoot.   Electronically Signed   By: Drusilla Kanner M.D.   On: 06/02/2015 12:15   Dg Foot Complete Right  06/01/2015   CLINICAL DATA:  Soft tissue infection in the distal right foot  EXAM: RIGHT FOOT COMPLETE - 3+ VIEW  COMPARISON:  05/23/2015  FINDINGS: There again noted changes consistent with amputation of the first through third digits. Fractures noted in the proximal fifth metatarsal. Some generalized soft tissue swelling is noted distally. No areas of bony  erosion to suggest osteomyelitis are seen.  IMPRESSION: Soft tissue changes without acute osteomyelitis.   Electronically Signed   By: Alcide Clever M.D.   On: 06/01/2015 21:57   Dg Foot Complete Right  05/23/2015   CLINICAL DATA:  Right foot wound and cellulitis for 6 months. Pain. Swelling and erythema.  EXAM: RIGHT FOOT COMPLETE - 3+ VIEW  COMPARISON:  03/08/2015  FINDINGS: Stable prior amputations of the first, second, and third digits. No progressive bony destructive findings. Old Jones fracture of the fifth metatarsal, without definite bridging.  No malalignment at the Lisfranc joint. Vascular calcifications noted.  Mild dorsal subcutaneous soft tissue swelling. Plantar calcaneal spur. Ill definition of the posterior subtalar facet due to obliquity.  IMPRESSION: 1. No current bony destructive findings characteristic of osteomyelitis. 2. Prior amputations of the first 3 digits. Prior Jones fracture of the fifth metatarsal, with questionable bony bridging, stable. 3. Dorsal soft tissue swelling. No abnormal gas tracking in the soft tissues.   Electronically Signed   By: Gaylyn Rong M.D.   On: 05/23/2015 14:55    Micro Results     Recent Results (from the past 240 hour(s))  Culture, blood (single)     Status: None (Preliminary result)   Collection Time: 06/01/15 10:37 PM  Result Value Ref Range Status   Specimen Description BLOOD RIGHT FOREARM  Final   Special Requests BOTTLES DRAWN AEROBIC AND ANAEROBIC 5CC  Final   Culture NO GROWTH 4 DAYS  Final   Report Status PENDING  Incomplete  MRSA PCR Screening     Status: Abnormal   Collection Time: 06/02/15 12:10 AM  Result Value Ref Range Status   MRSA by PCR POSITIVE (A) NEGATIVE Final    Comment:        The GeneXpert MRSA Assay (FDA approved for NASAL specimens only), is one component of a comprehensive MRSA colonization surveillance program. It is not intended to diagnose MRSA infection nor to guide or monitor treatment for MRSA  infections. RESULT CALLED TO, READ BACK BY AND VERIFIED WITH: M [facility]  06/02/15 MKELLY   C difficile quick scan w PCR reflex     Status: None   Collection Time: 06/02/15  1:00 AM  Result Value Ref Range Status   C Diff antigen NEGATIVE NEGATIVE Final   C Diff toxin NEGATIVE NEGATIVE Final   C  Diff interpretation Negative for toxigenic C. difficile  Final  Culture, blood (single)     Status: None (Preliminary result)   Collection Time: 06/02/15  1:55 AM  Result Value Ref Range Status   Specimen Description BLOOD LEFT HAND  Final   Special Requests BOTTLES DRAWN AEROBIC ONLY 5CC  Final   Culture NO GROWTH 3 DAYS  Final   Report Status PENDING  Incomplete  Surgical pcr screen     Status: Abnormal   Collection Time: 06/02/15  2:05 PM  Result Value Ref Range Status   MRSA, PCR NEGATIVE NEGATIVE Final   Staphylococcus aureus POSITIVE (A) NEGATIVE Final    Comment:        The Xpert SA Assay (FDA approved for NASAL specimens in patients over 47 years of age), is one component of a comprehensive surveillance program.  Test performance has been validated by [facility] for patients greater than or equal to 5 year old. It is not intended to diagnose infection nor to guide or monitor treatment.        Today   Subjective:   [name] today has no headache,no chest abdominal pain,no new weakness tingling or numbness, feels much better wants to go home today.   Objective:   Blood pressure 168/74, pulse 70, temperature 97.9 F (36.6 C), temperature source Oral, resp. rate 20, height  (1.727 m), weight 96.7 kg (213 lb 3 oz), last menstrual period 11/19/2011, SpO2 95 %.   Intake/Output Summary (Last 24 hours) at 06/06/15 1127 Last data filed at 06/06/15 0900  Gross per 24 hour  Intake 2538.75 ml  Output      0 ml  Net 2538.75 ml    Exam  General: no acute distress  HEENT: moist oral mucosa, supple neck  Chest: Clear to auscultation bilaterally  CVS:  Normal S1 and S2, no murmurs rub or gallop  Chest: Soft, nondistended, nontender, pulses present  Musculoskeletal: No edema, dressing over right foot  CNS: Alert and oriented  Data Review   CBC w Diff:  Lab Results  Component Value Date   WBC 9.5 06/03/2015   HGB 12.5 06/03/2015   HCT 39.3 06/03/2015   PLT 264 06/03/2015   LYMPHOPCT 22 06/01/2015   MONOPCT 9 06/01/2015   EOSPCT 3 06/01/2015   BASOPCT 0 06/01/2015    CMP:  Lab Results  Component Value Date   NA 133* 06/06/2015   K 4.9 06/06/2015   CL 107 06/06/2015   CO2 18* 06/06/2015   BUN 19 06/06/2015   CREATININE 1.31* 06/06/2015   CREATININE 1.07 10/29/2014   PROT 6.3* 06/02/2015   ALBUMIN 2.8* 06/02/2015   BILITOT 1.1 06/02/2015   ALKPHOS 70 06/02/2015   AST 20 06/02/2015   ALT 15 06/02/2015  .   Total Time in preparing paper work, data evaluation and todays exam - 35 minutes  ELGERGAWY, DAWOOD M.D on 06/06/2015 at 11:27 AM  [facility]   Office  [phone]

## 2015-06-07 LAB — GLUCOSE, CAPILLARY
GLUCOSE-CAPILLARY: 25 mg/dL — AB (ref 65–99)
GLUCOSE-CAPILLARY: 32 mg/dL — AB (ref 65–99)
GLUCOSE-CAPILLARY: 29 mg/dL — AB (ref 65–99)

## 2015-06-07 LAB — CULTURE, BLOOD (SINGLE): CULTURE: NO GROWTH

## 2015-06-10 ENCOUNTER — Inpatient Hospital Stay: Payer: Medicare Other | Admitting: Family Medicine

## 2015-06-14 ENCOUNTER — Encounter (HOSPITAL_BASED_OUTPATIENT_CLINIC_OR_DEPARTMENT_OTHER): Payer: Medicare Other

## 2015-09-24 ENCOUNTER — Observation Stay (HOSPITAL_COMMUNITY)
Admission: EM | Admit: 2015-09-24 | Discharge: 2015-09-26 | Disposition: A | Discharge status: Home / Self Care | Payer: Medicare Other | Attending: Internal Medicine | Admitting: Internal Medicine

## 2015-09-24 ENCOUNTER — Encounter (HOSPITAL_COMMUNITY): Payer: Self-pay | Admitting: *Deleted

## 2015-09-24 DIAGNOSIS — Z794 Long term (current) use of insulin: Secondary | ICD-10-CM | POA: Insufficient documentation

## 2015-09-24 DIAGNOSIS — R739 Hyperglycemia, unspecified: Secondary | ICD-10-CM | POA: Insufficient documentation

## 2015-09-24 DIAGNOSIS — R358 Other polyuria: Secondary | ICD-10-CM | POA: Insufficient documentation

## 2015-09-24 DIAGNOSIS — N182 Chronic kidney disease, stage 2 (mild): Secondary | ICD-10-CM | POA: Diagnosis not present

## 2015-09-24 DIAGNOSIS — N186 End stage renal disease: Secondary | ICD-10-CM | POA: Insufficient documentation

## 2015-09-24 DIAGNOSIS — E1010 Type 1 diabetes mellitus with ketoacidosis without coma: Secondary | ICD-10-CM | POA: Diagnosis present

## 2015-09-24 DIAGNOSIS — N289 Disorder of kidney and ureter, unspecified: Secondary | ICD-10-CM | POA: Diagnosis not present

## 2015-09-24 DIAGNOSIS — M1990 Unspecified osteoarthritis, unspecified site: Secondary | ICD-10-CM | POA: Insufficient documentation

## 2015-09-24 DIAGNOSIS — E039 Hypothyroidism, unspecified: Secondary | ICD-10-CM | POA: Diagnosis present

## 2015-09-24 DIAGNOSIS — I120 Hypertensive chronic kidney disease with stage 5 chronic kidney disease or end stage renal disease: Secondary | ICD-10-CM | POA: Diagnosis not present

## 2015-09-24 DIAGNOSIS — Z940 Kidney transplant status: Secondary | ICD-10-CM | POA: Diagnosis not present

## 2015-09-24 DIAGNOSIS — R05 Cough: Secondary | ICD-10-CM

## 2015-09-24 DIAGNOSIS — I10 Essential (primary) hypertension: Secondary | ICD-10-CM | POA: Diagnosis not present

## 2015-09-24 DIAGNOSIS — Z79899 Other long term (current) drug therapy: Secondary | ICD-10-CM | POA: Diagnosis not present

## 2015-09-24 DIAGNOSIS — F329 Major depressive disorder, single episode, unspecified: Secondary | ICD-10-CM | POA: Insufficient documentation

## 2015-09-24 DIAGNOSIS — Z7952 Long term (current) use of systemic steroids: Secondary | ICD-10-CM | POA: Insufficient documentation

## 2015-09-24 DIAGNOSIS — E1165 Type 2 diabetes mellitus with hyperglycemia: Secondary | ICD-10-CM | POA: Diagnosis not present

## 2015-09-24 DIAGNOSIS — R631 Polydipsia: Secondary | ICD-10-CM | POA: Insufficient documentation

## 2015-09-24 DIAGNOSIS — R059 Cough, unspecified: Secondary | ICD-10-CM

## 2015-09-24 HISTORY — DX: Type 2 diabetes mellitus without complications: E11.9

## 2015-09-24 HISTORY — DX: Headache: R51

## 2015-09-24 HISTORY — DX: Headache, unspecified: R51.9

## 2015-09-24 HISTORY — DX: Unspecified osteoarthritis, unspecified site: M19.90

## 2015-09-24 HISTORY — DX: Pneumonia, unspecified organism: J18.9

## 2015-09-24 LAB — MAGNESIUM: Magnesium: 1.4 mg/dL — ABNORMAL LOW (ref 1.7–2.4)

## 2015-09-24 LAB — URINE MICROSCOPIC-ADD ON
BACTERIA UA: NONE SEEN
WBC, UA: NONE SEEN WBC/hpf (ref 0–5)

## 2015-09-24 LAB — CBC
HCT: 41.1 % (ref 36.0–46.0)
Hemoglobin: 13.3 g/dL (ref 12.0–15.0)
MCH: 28.5 pg (ref 26.0–34.0)
MCHC: 32.4 g/dL (ref 30.0–36.0)
MCV: 88.2 fL (ref 78.0–100.0)
PLATELETS: 171 10*3/uL (ref 150–400)
RBC: 4.66 MIL/uL (ref 3.87–5.11)
RDW: 12.9 % (ref 11.5–15.5)
WBC: 5 10*3/uL (ref 4.0–10.5)

## 2015-09-24 LAB — BASIC METABOLIC PANEL
Anion gap: 11 (ref 5–15)
Anion gap: 12 (ref 5–15)
Anion gap: 12 (ref 5–15)
Anion gap: 11 (ref 5–15)
BUN: 29 mg/dL — AB (ref 6–20)
BUN: 30 mg/dL — AB (ref 6–20)
BUN: 28 mg/dL — AB (ref 6–20)
BUN: 25 mg/dL — ABNORMAL HIGH (ref 6–20)
CALCIUM: 9.9 mg/dL (ref 8.9–10.3)
CHLORIDE: 95 mmol/L — AB (ref 101–111)
CHLORIDE: 100 mmol/L — AB (ref 101–111)
CHLORIDE: 105 mmol/L (ref 101–111)
CHLORIDE: 107 mmol/L (ref 101–111)
CO2: 23 mmol/L (ref 22–32)
CO2: 20 mmol/L — AB (ref 22–32)
CO2: 20 mmol/L — AB (ref 22–32)
CO2: 22 mmol/L (ref 22–32)
CREATININE: 1.41 mg/dL — AB (ref 0.44–1.00)
CREATININE: 1.17 mg/dL — AB (ref 0.44–1.00)
CREATININE: 0.95 mg/dL (ref 0.44–1.00)
Calcium: 9.3 mg/dL (ref 8.9–10.3)
Calcium: 9.7 mg/dL (ref 8.9–10.3)
Calcium: 9.4 mg/dL (ref 8.9–10.3)
Creatinine, Ser: 1.23 mg/dL — ABNORMAL HIGH (ref 0.44–1.00)
GFR calc Af Amer: 50 mL/min — ABNORMAL LOW (ref >60)
GFR calc Af Amer: 59 mL/min — ABNORMAL LOW (ref >60)
GFR calc non Af Amer: 43 mL/min — ABNORMAL LOW (ref >60)
GFR calc non Af Amer: 51 mL/min — ABNORMAL LOW (ref >60)
GFR calc non Af Amer: 54 mL/min — ABNORMAL LOW (ref >60)
GFR calc non Af Amer: >60 mL/min (ref >60)
GLUCOSE: 650 mg/dL — AB (ref 65–99)
GLUCOSE: 311 mg/dL — AB (ref 65–99)
GLUCOSE: 136 mg/dL — AB (ref 65–99)
Glucose, Bld: 756 mg/dL (ref 65–99)
Potassium: 4.5 mmol/L (ref 3.5–5.1)
Potassium: 4.1 mmol/L (ref 3.5–5.1)
Potassium: 4.1 mmol/L (ref 3.5–5.1)
Potassium: 4.1 mmol/L (ref 3.5–5.1)
Sodium: 129 mmol/L — ABNORMAL LOW (ref 135–145)
Sodium: 132 mmol/L — ABNORMAL LOW (ref 135–145)
Sodium: 137 mmol/L (ref 135–145)
Sodium: 140 mmol/L (ref 135–145)

## 2015-09-24 LAB — I-STAT VENOUS BLOOD GAS, ED
ACID-BASE DEFICIT: 1 mmol/L (ref 0.0–2.0)
Bicarbonate: 23.1 mEq/L (ref 20.0–24.0)
O2 SAT: 67 %
PCO2 VEN: 37.4 mmHg — AB (ref 45.0–50.0)
TCO2: 24 mmol/L (ref 0–100)
pH, Ven: 7.398 — ABNORMAL HIGH (ref 7.250–7.300)
pO2, Ven: 35 mmHg (ref 30.0–45.0)

## 2015-09-24 LAB — URINALYSIS, ROUTINE W REFLEX MICROSCOPIC
BILIRUBIN URINE: NEGATIVE
KETONES UR: 15 mg/dL — AB
Leukocytes, UA: NEGATIVE
Nitrite: NEGATIVE
PROTEIN: 30 mg/dL — AB
Specific Gravity, Urine: 1.02 (ref 1.005–1.030)
pH: 6 (ref 5.0–8.0)

## 2015-09-24 LAB — GLUCOSE, CAPILLARY
GLUCOSE-CAPILLARY: 99 mg/dL (ref 65–99)
GLUCOSE-CAPILLARY: 107 mg/dL — AB (ref 65–99)
GLUCOSE-CAPILLARY: 159 mg/dL — AB (ref 65–99)

## 2015-09-24 LAB — CBG MONITORING, ED
GLUCOSE-CAPILLARY: 566 mg/dL — AB (ref 65–99)
GLUCOSE-CAPILLARY: 129 mg/dL — AB (ref 65–99)
Glucose-Capillary: >600 mg/dL (ref 65–99)
Glucose-Capillary: >600 mg/dL (ref 65–99)
Glucose-Capillary: >600 mg/dL (ref 65–99)
Glucose-Capillary: 365 mg/dL — ABNORMAL HIGH (ref 65–99)
Glucose-Capillary: 204 mg/dL — ABNORMAL HIGH (ref 65–99)

## 2015-09-24 LAB — PHOSPHORUS: PHOSPHORUS: 3.5 mg/dL (ref 2.5–4.6)

## 2015-09-24 MED ORDER — INSULIN ASPART 100 UNIT/ML ~~LOC~~ SOLN
0.0000 [IU] | Freq: Every day | SUBCUTANEOUS | Status: DC
  Administered 2015-09-25: 3 [IU] via SUBCUTANEOUS

## 2015-09-24 MED ORDER — SODIUM CHLORIDE 0.9 % IJ SOLN
3.0000 mL | Freq: Two times a day (BID) | INTRAMUSCULAR | Status: DC
  Administered 2015-09-25 – 2015-09-26 (×3): 3 mL via INTRAVENOUS

## 2015-09-24 MED ORDER — INSULIN ASPART 100 UNIT/ML ~~LOC~~ SOLN
0.0000 [IU] | Freq: Three times a day (TID) | SUBCUTANEOUS | Status: DC
  Administered 2015-09-25: 3 [IU] via SUBCUTANEOUS
  Administered 2015-09-25 (×2): 8 [IU] via SUBCUTANEOUS
  Administered 2015-09-26: 5 [IU] via SUBCUTANEOUS

## 2015-09-24 MED ORDER — DEXTROSE-NACL 5-0.45 % IV SOLN
INTRAVENOUS | Status: DC
  Administered 2015-09-24: 19:00:00 via INTRAVENOUS

## 2015-09-24 MED ORDER — SODIUM CHLORIDE 0.9 % IV BOLUS (SEPSIS)
1000.0000 mL | Freq: Once | INTRAVENOUS | Status: AC
  Administered 2015-09-24: 1000 mL via INTRAVENOUS

## 2015-09-24 MED ORDER — GLUCERNA SHAKE PO LIQD
237.0000 mL | Freq: Three times a day (TID) | ORAL | Status: DC
  Administered 2015-09-26: 237 mL via ORAL

## 2015-09-24 MED ORDER — CARVEDILOL 25 MG PO TABS
50.0000 mg | ORAL_TABLET | Freq: Two times a day (BID) | ORAL | Status: DC
  Administered 2015-09-24 – 2015-09-26 (×4): 50 mg via ORAL
  Filled 2015-09-24 (×4): qty 2

## 2015-09-24 MED ORDER — MYCOPHENOLATE MOFETIL 250 MG PO CAPS
500.0000 mg | ORAL_CAPSULE | Freq: Two times a day (BID) | ORAL | Status: DC
  Administered 2015-09-24 – 2015-09-26 (×4): 500 mg via ORAL
  Filled 2015-09-24 (×4): qty 2

## 2015-09-24 MED ORDER — TACROLIMUS 1 MG PO CAPS
1.0000 mg | ORAL_CAPSULE | Freq: Two times a day (BID) | ORAL | Status: DC
  Administered 2015-09-24 – 2015-09-26 (×4): 1 mg via ORAL
  Filled 2015-09-24 (×4): qty 1

## 2015-09-24 MED ORDER — OXYCODONE HCL 5 MG PO TABS
5.0000 mg | ORAL_TABLET | Freq: Four times a day (QID) | ORAL | Status: DC | PRN
Start: 2015-09-24 — End: 2015-09-26

## 2015-09-24 MED ORDER — ENOXAPARIN SODIUM 40 MG/0.4ML ~~LOC~~ SOLN: 40.0000 mg | SUBCUTANEOUS | Status: DC

## 2015-09-24 MED ORDER — SODIUM CHLORIDE 0.9 % IV SOLN
INTRAVENOUS | Status: DC
  Administered 2015-09-24: 5.4 [IU]/h via INTRAVENOUS
  Administered 2015-09-24: 0.1 [IU]/h via INTRAVENOUS
  Administered 2015-09-25 (×2): 0.5 [IU]/h via INTRAVENOUS
  Filled 2015-09-24: qty 2.5

## 2015-09-24 MED ORDER — AMLODIPINE BESYLATE 5 MG PO TABS
5.0000 mg | ORAL_TABLET | Freq: Every day | ORAL | Status: DC
  Administered 2015-09-25 – 2015-09-26 (×2): 5 mg via ORAL
  Filled 2015-09-24 (×2): qty 1

## 2015-09-24 MED ORDER — SODIUM CHLORIDE 0.9 % IV SOLN: INTRAVENOUS | Status: AC

## 2015-09-24 MED ORDER — DEXTROSE-NACL 5-0.45 % IV SOLN: INTRAVENOUS | Status: DC

## 2015-09-24 MED ORDER — DIPHENHYDRAMINE HCL 25 MG PO CAPS
25.0000 mg | ORAL_CAPSULE | Freq: Four times a day (QID) | ORAL | Status: DC | PRN
  Administered 2015-09-24: 25 mg via ORAL
  Filled 2015-09-24: qty 1

## 2015-09-24 MED ORDER — INSULIN GLARGINE 100 UNIT/ML ~~LOC~~ SOLN
15.0000 [IU] | Freq: Every day | SUBCUTANEOUS | Status: DC
  Administered 2015-09-25: 15 [IU] via SUBCUTANEOUS
  Filled 2015-09-24 (×2): qty 0.15

## 2015-09-24 MED ORDER — INSULIN REGULAR HUMAN 100 UNIT/ML IJ SOLN
INTRAMUSCULAR | Status: DC
  Filled 2015-09-24: qty 2.5

## 2015-09-24 MED ORDER — LEVOTHYROXINE SODIUM 25 MCG PO TABS
137.0000 ug | ORAL_TABLET | Freq: Every evening | ORAL | Status: DC
  Administered 2015-09-25: 137 ug via ORAL
  Filled 2015-09-24 (×2): qty 1

## 2015-09-24 MED ORDER — PREDNISONE 5 MG PO TABS
5.0000 mg | ORAL_TABLET | Freq: Every day | ORAL | Status: DC
  Administered 2015-09-25 – 2015-09-26 (×2): 5 mg via ORAL
  Filled 2015-09-24 (×2): qty 1

## 2015-09-24 MED ORDER — SODIUM CHLORIDE 0.9 % IV SOLN
INTRAVENOUS | Status: DC
  Administered 2015-09-24 – 2015-09-25 (×3): via INTRAVENOUS

## 2015-09-24 MED ORDER — POTASSIUM CHLORIDE 10 MEQ/100ML IV SOLN
10.0000 meq | INTRAVENOUS | Status: AC
  Administered 2015-09-24: 10 meq via INTRAVENOUS
  Filled 2015-09-24: qty 100

## 2015-09-24 NOTE — ED Notes (Signed)
Admitting at bedside 

## 2015-09-24 NOTE — ED Provider Notes (Signed)
CSN: [id number]     Arrival date & time 09/24/15  1054 History   First MD Initiated Contact with Patient 09/24/15 1115     Chief Complaint  Patient presents with  . Hyperglycemia     (Consider location/radiation/quality/duration/timing/severity/associated sxs/prior Treatment) Patient is a 50 y.o. female presenting with hyperglycemia.  Hyperglycemia Blood sugar level PTA:  300+ Severity:  Moderate Onset quality:  Gradual Duration:  2 days Timing:  Constant Progression:  Unchanged Chronicity:  New Diabetes status:  Controlled with insulin Current diabetic therapy:  15u humilin 70/30 BID Time since last antidiabetic medication:  12 hours Context: not change in medication   Relieved by:  Nothing Ineffective treatments:  Insulin Associated symptoms: confusion (mild), increased thirst, malaise and polyuria   Associated symptoms: no abdominal pain, no dysuria, no fever and no increased appetite     Past Medical History  Diagnosis Date  . Hypertension   . Hypothyroid   . Depression   . 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 Kidney disease   . End stage renal disease (HCC)     right arm AV graft, post transplant  . Diabetes mellitus     Insulin dependant   Past Surgical History  Procedure Laterality Date  . Back surgery  2001  . Tubal ligation    . Dg av dialysis graft declot or    . Nephrectomy transplanted organ    . Kidney transplant  December 16, 2009  . 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  . Cataract extraction w/ intraocular lens implant Right   . Amputation Left 05/11/2014    Procedure: AMPUTATION LEFT GREAT TOE;  Surgeon: Kathryne Hitch, MD;  Location: [facility];  Service: Orthopedics;  Laterality: Left;  . Incision and drainage Right 06/02/2015    Procedure: INCISION AND DRAINAGE OF RIGHT 3rd RAY RESECTION;  Surgeon: Kathryne Hitch, MD;  Location: [facility];  Service: Orthopedics;  Laterality: Right;   Family History  Problem Relation  Age of Onset  . Emphysema Mother   . Throat cancer Mother   . COPD Mother   . Cancer Mother   . Emphysema Father   . COPD Father   . Stroke Father   . ADD / ADHD Son    Social History  Substance Use Topics  . Smoking status: Former Smoker -- 1.00 packs/day for 11 years    Types: Cigarettes    Quit date: 09/21/1998  . Smokeless tobacco: Never Used  . Alcohol Use: No   OB History    No data available     Review of Systems  Constitutional: Negative for fever.  Gastrointestinal: Negative for abdominal pain.  Endocrine: Positive for polydipsia and polyuria.  Genitourinary: Negative for dysuria.  Psychiatric/Behavioral: Positive for confusion (mild).  All other systems reviewed and are negative.     Allergies  Ace inhibitors and Adhesive  Home Medications   Prior to Admission medications   Medication Sig Start Date End Date Taking? Authorizing Provider  acetaminophen (TYLENOL) 500 MG tablet Take 500-1,000 mg by mouth every 6 (six) hours as needed for headache.    Historical Provider, MD  amLODipine (NORVASC) 5 MG tablet Take 1 tablet (5 mg total) by mouth daily. 03/04/15   Alison Murray, MD  carvedilol (COREG) 25 MG tablet Take 2 tablets (50 mg total) by mouth 2 (two) times daily. 03/04/15   Alison Murray, MD  feeding supplement, GLUCERNA SHAKE, (GLUCERNA SHAKE) LIQD Take 237  mLs by mouth 3 (three) times daily between meals. 06/04/15   Nishant Dhungel, MD  insulin NPH-regular Human (NOVOLIN 70/30) (70-30) 100 UNIT/ML injection Inject 15 Units into the skin 2 (two) times daily with a meal. 06/06/15   Leana Roe Elgergawy, MD  levothyroxine (SYNTHROID, LEVOTHROID) 137 MCG tablet Take 1 tablet (137 mcg total) by mouth daily. BRAND NAME ONLY Patient taking differently: Take 137 mcg by mouth every evening. BRAND NAME ONLY 03/04/15   Alison Murray, MD  mupirocin ointment (BACTROBAN) 2 % Place into the nose 2 (two) times daily. 06/04/15   Nishant Dhungel, MD  mycophenolate (CELLCEPT) 250  MG capsule Take 500 mg by mouth 2 (two) times daily.     Historical Provider, MD  oxyCODONE (OXY IR/ROXICODONE) 5 MG immediate release tablet Take 1 tablet (5 mg total) by mouth every 6 (six) hours as needed for moderate pain. 06/04/15   Nishant Dhungel, MD  predniSONE (DELTASONE) 5 MG tablet Take 5 mg by mouth daily. 05/29/15   Historical Provider, MD  ranitidine (ZANTAC) 150 MG capsule Take 150 mg by mouth daily as needed for heartburn.     Historical Provider, MD  tacrolimus (PROGRAF) 1 MG capsule Take 1 mg by mouth 2 (two) times daily.     Historical Provider, MD   Temp(Src) 97.8 F (36.6 C) (Oral)  Ht  (1.727 m)  Wt 225 lb (102.059 kg)  BMI 34.22 kg/m2  SpO2 96%  LMP 11/19/2011 Physical Exam  Constitutional: She is oriented to person, place, and time. She appears well-developed and well-nourished. No distress.  HENT:  Head: Normocephalic.  Eyes: Conjunctivae are normal.  Neck: Neck supple. No tracheal deviation present.  Cardiovascular: Normal rate, regular rhythm and normal heart sounds.   Pulmonary/Chest: Effort normal and breath sounds normal. No respiratory distress.  Abdominal: Soft. She exhibits no distension.  Neurological: She is alert and oriented to person, place, and time.  Skin: Skin is warm and dry.  Psychiatric: She has a normal mood and affect.    ED Course  Procedures (including critical care time) Labs Review Labs Reviewed  BASIC METABOLIC PANEL - Abnormal; Notable for the following:    Sodium 129 (*)    Chloride 95 (*)    Glucose, Bld 756 (*)    BUN 29 (*)    Creatinine, Ser 1.41 (*)    GFR calc non Af Amer 43 (*)    GFR calc Af Amer 50 (*)    All other components within normal limits  URINALYSIS, ROUTINE W REFLEX MICROSCOPIC (NOT AT [facility]) - Abnormal; Notable for the following:    Glucose, UA >1000 (*)    Hgb urine dipstick TRACE (*)    Ketones, ur 15 (*)    Protein, ur 30 (*)    All other components within normal limits  URINE MICROSCOPIC-ADD  ON - Abnormal; Notable for the following:    Squamous Epithelial / LPF 0-5 (*)    All other components within normal limits  BASIC METABOLIC PANEL - Abnormal; Notable for the following:    Sodium 132 (*)    Chloride 100 (*)    CO2 20 (*)    Glucose, Bld 650 (*)    BUN 30 (*)    Creatinine, Ser 1.23 (*)    GFR calc non Af Amer 51 (*)    GFR calc Af Amer 59 (*)    All other components within normal limits  BASIC METABOLIC PANEL - Abnormal; Notable for the following:  CO2 20 (*)    Glucose, Bld 311 (*)    BUN 28 (*)    Creatinine, Ser 1.17 (*)    GFR calc non Af Amer 54 (*)    All other components within normal limits  BASIC METABOLIC PANEL - Abnormal; Notable for the following:    Glucose, Bld 136 (*)    BUN 25 (*)    All other components within normal limits  MAGNESIUM - Abnormal; Notable for the following:    Magnesium 1.4 (*)    All other components within normal limits  COMPREHENSIVE METABOLIC PANEL - Abnormal; Notable for the following:    CO2 21 (*)    Glucose, Bld 203 (*)    BUN 23 (*)    Creatinine, Ser 1.03 (*)    Total Protein 6.0 (*)    Albumin 2.7 (*)    AST 12 (*)    ALT 10 (*)    All other components within normal limits  GLUCOSE, CAPILLARY - Abnormal; Notable for the following:    Glucose-Capillary 107 (*)    All other components within normal limits  GLUCOSE, CAPILLARY - Abnormal; Notable for the following:    Glucose-Capillary 159 (*)    All other components within normal limits  GLUCOSE, CAPILLARY - Abnormal; Notable for the following:    Glucose-Capillary 152 (*)    All other components within normal limits  GLUCOSE, CAPILLARY - Abnormal; Notable for the following:    Glucose-Capillary 167 (*)    All other components within normal limits  GLUCOSE, CAPILLARY - Abnormal; Notable for the following:    Glucose-Capillary 192 (*)    All other components within normal limits  GLUCOSE, CAPILLARY - Abnormal; Notable for the following:     Glucose-Capillary 267 (*)    All other components within normal limits  CBG MONITORING, ED - Abnormal; Notable for the following:    Glucose-Capillary >600 (*)    All other components within normal limits  I-STAT VENOUS BLOOD GAS, ED - Abnormal; Notable for the following:    pH, Ven 7.398 (*)    pCO2, Ven 37.4 (*)    All other components within normal limits  CBG MONITORING, ED - Abnormal; Notable for the following:    Glucose-Capillary >600 (*)    All other components within normal limits  CBG MONITORING, ED - Abnormal; Notable for the following:    Glucose-Capillary >600 (*)    All other components within normal limits  CBG MONITORING, ED - Abnormal; Notable for the following:    Glucose-Capillary 566 (*)    All other components within normal limits  CBG MONITORING, ED - Abnormal; Notable for the following:    Glucose-Capillary 365 (*)    All other components within normal limits  CBG MONITORING, ED - Abnormal; Notable for the following:    Glucose-Capillary 204 (*)    All other components within normal limits  CBG MONITORING, ED - Abnormal; Notable for the following:    Glucose-Capillary 129 (*)    All other components within normal limits  MRSA PCR SCREENING  CBC  PHOSPHORUS  CBC  GLUCOSE, CAPILLARY  HEMOGLOBIN A1C    Imaging Review Dg Chest 2 View  09/25/2015  CLINICAL DATA:  50 year old female with cough for 1 week and rhinitis. Initial encounter. EXAM: CHEST  2 VIEW COMPARISON:  01/02/2015 and earlier. FINDINGS: Lung volumes remain normal. Normal cardiac size and mediastinal contours. Visualized tracheal air column is within normal limits. There is a new small pleural effusion  evident on the lateral view in the costophrenic angle, probably on the left. No pneumothorax or pulmonary edema. No consolidation or other acute pulmonary opacity. Mildly increased interstitial markings are stable. No acute osseous abnormality identified. IMPRESSION: New small pleural effusion  probably on the left. No associated pneumonia or other acute cardiopulmonary abnormality Identified. Electronically Signed   By: Odessa Fleming M.D.   On: 09/25/2015 13:59   I have personally reviewed and evaluated these images and lab results as part of my medical decision-making.   EKG Interpretation   Date/Time:  Tuesday September 24 2015 11:16:52 EST Ventricular Rate:  73 PR Interval:  137 QRS Duration: 134 QT Interval:  460 QTC Calculation: 507 R Axis:   173 Text Interpretation:  Sinus rhythm Consider left atrial enlargement Right  bundle branch block Baseline wander in lead(s) V5 No significant change  since last tracing Confirmed by Keyston Ardolino MD, Brianda Beitler ([id number]) on 09/24/2015  11:21:05 AM      MDM   Final diagnoses:  Hyperglycemia without ketosis    50 y.o. female with h/o renal transplantation remotely presents with poorly controlled diabetes and polyuria/polydipsia accompanied by malaise. AFVSS on arrival. CBG is >700, no gap or evidence of ketosis/acidosis. Started on IVF and insulin infusion, will require admission for monitoring and slow correction. Hospitalist was consulted for admission and will see the patient in the emergency department.     Lyndal Pulley, MD 09/25/15 [phone]

## 2015-09-24 NOTE — ED Notes (Signed)
Pt arrives from home via [facility]. Pt states she began having n/v and loose stools two days ago. Pt husband called EMS rt pt being lethargic and "not feeling right". Pt states she hasn't had any insulin today. CBG is reading high.

## 2015-09-24 NOTE — ED Notes (Signed)
CBG 365 

## 2015-09-24 NOTE — ED Notes (Addendum)
CBG 129 

## 2015-09-24 NOTE — H&P (Signed)
[facility] History and Physical  MITCHELL EPLING ZOX:[id number] DOB: May 07, 1966 DOA: 09/24/2015  Referring physician: Dr. Clydene Pugh - [facility] PCP: [name], MD   Chief Complaint: n/v/d and general malaise.   HPI: [name] is a 50 y.o. female  Patient is an extremely poor historian. Patient with very little understanding of her underlying medical conditions and how they're treated. States that how to titrate insulin with regards to sugar levels. Unsure of what her typical glucose level is. Husband unavailable at time of admission. Attempted to call patient's husband and only got his voicemail. Patient states that over the last 2 days she is felt generally ill andthat her sugar started to run "high." Patient unable to tell me how high they were actually running. At this time patient had associated nausea, vomiting and a few loose stools. These persisted for approximately a day and a half but are improving spontaneously. Patient did not take anything for her symptoms. Nothing makes her symptoms worse. There is some associated lethargy but denies chest pain, palpitations, shortness of breath, rash, fever, neck stiffness, headache. Patient has had significant frequency and dry mouth during this period of time.   Review of Systems:  Constitutional:  No weight loss, night sweats, Fevers, chills, HEENT:  No headaches, Difficulty swallowing,Tooth/dental problems,Sore throat, Cardio-vascular:  No chest pain, Orthopnea, PND, palpitations  GI: Pwer HPI Resp:   No shortness of breath with exertion or at rest. No excess mucus, no productive cough, No non-productive cough, No coughing up of blood.No change in color of mucus.No wheezing.No chest wall deformity  Skin:  no rash or lesions.  GU:  no dysuria, change in color of urine, no urgency Musculoskeletal:   No joint pain or swelling. No decreased range of motion. No back pain.  Psych:  No change in mood or affect. No depression  or anxiety. No memory loss.  Neuro:  No change in sensation, unilateral strength, or cognitive abilities  All other systems were reviewed and are negative.  Past Medical History  Diagnosis Date  . Hypertension   . Hypothyroid   . Depression   . 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 Kidney disease   . End stage renal disease (HCC)     right arm AV graft, post transplant  . Diabetes mellitus     Insulin dependant   Past Surgical History  Procedure Laterality Date  . Back surgery  2001  . Tubal ligation    . Dg av dialysis graft declot or    . Nephrectomy transplanted organ    . Kidney transplant  December 16, 2009  . 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  . Cataract extraction w/ intraocular lens implant Right   . Amputation Left 05/11/2014    Procedure: AMPUTATION LEFT GREAT TOE;  Surgeon: Kathryne Hitch, MD;  Location: [facility];  Service: Orthopedics;  Laterality: Left;  . Incision and drainage Right 06/02/2015    Procedure: INCISION AND DRAINAGE OF RIGHT 3rd RAY RESECTION;  Surgeon: Kathryne Hitch, MD;  Location: [facility];  Service: Orthopedics;  Laterality: Right;   Social History:  reports that she quit smoking about 17 years ago. Her smoking use included Cigarettes. She has a 11 pack-year smoking history. She has never used smokeless tobacco. She reports that she does not drink alcohol or use illicit drugs.  Allergies  Allergen Reactions  . Ace Inhibitors Cough    Lisinopril  . Adhesive [Tape] Itching  Family History  Problem Relation Age of Onset  . Emphysema Mother   . Throat cancer Mother   . COPD Mother   . Cancer Mother   . Emphysema Father   . COPD Father   . Stroke Father   . ADD / ADHD Son      Prior to Admission medications   Medication Sig Start Date End Date Taking? Authorizing Provider  acetaminophen (TYLENOL) 500 MG tablet Take 500-1,000 mg by mouth every 6 (six) hours as needed for headache.   Yes Historical  Provider, MD  amLODipine (NORVASC) 5 MG tablet Take 1 tablet (5 mg total) by mouth daily. 03/04/15  Yes Alison Murray, MD  carvedilol (COREG) 25 MG tablet Take 2 tablets (50 mg total) by mouth 2 (two) times daily. 03/04/15  Yes Alison Murray, MD  feeding supplement, GLUCERNA SHAKE, (GLUCERNA SHAKE) LIQD Take 237 mLs by mouth 3 (three) times daily between meals. 06/04/15  Yes Nishant Dhungel, MD  insulin NPH-regular Human (NOVOLIN 70/30) (70-30) 100 UNIT/ML injection Inject 15 Units into the skin 2 (two) times daily with a meal. 06/06/15  Yes Dawood S Elgergawy, MD  levothyroxine (SYNTHROID, LEVOTHROID) 137 MCG tablet Take 1 tablet (137 mcg total) by mouth daily. BRAND NAME ONLY Patient taking differently: Take 137 mcg by mouth every evening. BRAND NAME ONLY 03/04/15  Yes Alison Murray, MD  mycophenolate (CELLCEPT) 250 MG capsule Take 500 mg by mouth 2 (two) times daily.    Yes Historical Provider, MD  predniSONE (DELTASONE) 5 MG tablet Take 5 mg by mouth daily. 05/29/15  Yes Historical Provider, MD  Pseudoephedrine-DM-GG-APAP (TYLENOL COLD) 30-15-200-325 MG TABS Take 2 tablets by mouth every 4 (four) hours as needed. For cold & flu symptoms   Yes Historical Provider, MD  ranitidine (ZANTAC) 150 MG capsule Take 150 mg by mouth daily as needed for heartburn.    Yes Historical Provider, MD  tacrolimus (PROGRAF) 1 MG capsule Take 1 mg by mouth 2 (two) times daily.    Yes Historical Provider, MD  mupirocin ointment (BACTROBAN) 2 % Place into the nose 2 (two) times daily. Patient not taking: Reported on 09/24/2015 06/04/15   Nishant Dhungel, MD  oxyCODONE (OXY IR/ROXICODONE) 5 MG immediate release tablet Take 1 tablet (5 mg total) by mouth every 6 (six) hours as needed for moderate pain. Patient not taking: Reported on 09/24/2015 06/04/15   Eddie North, MD   Physical Exam: Filed Vitals:   09/24/15 1230 09/24/15 1245 09/24/15 1247 09/24/15 1300  BP: 151/74 157/65 157/65 160/71  Pulse: 74 73 73 73  Temp:       TempSrc:      Resp: Height:      Weight:      SpO2: 95% 96% 95% 97%    Wt Readings from Last 3 Encounters:  09/24/15 102.059 kg (225 lb)  06/02/15 96.7 kg (213 lb 3 oz)  05/23/15 102.059 kg (225 lb)    General:  Appears calm and comfortable Eyes:  PERRL, EOMI, normal lids, iris ENT:  grossly normal hearing, lips & tongue Neck:  no LAD, masses or thyromegaly Cardiovascular:  RRR, no m/r/g. 1+ LE edema.  Respiratory:  CTA bilaterally, no w/r/r. Normal respiratory effort. Abdomen:  soft, ntnd Skin:  no rash or induration seen on limited exam Musculoskeletal:  grossly normal tone BUE/BLE Psychiatric:  grossly normal mood and affect, speech fluent and appropriate Neurologic:  CN 2-12 grossly intact, moves all extremities in coordinated  fashion.          Labs on Admission:  Basic Metabolic Panel:  Recent Labs Lab 09/24/15 1155  NA 129*  K 4.5  CL 95*  CO2 23  GLUCOSE 756*  BUN 29*  CREATININE 1.41*  CALCIUM 9.9   Liver Function Tests: No results for input(s): AST, ALT, ALKPHOS, BILITOT, PROT, ALBUMIN in the last 168 hours. No results for input(s): LIPASE, AMYLASE in the last 168 hours. No results for input(s): AMMONIA in the last 168 hours. CBC:  Recent Labs Lab 09/24/15 1155  WBC 5.0  HGB 13.3  HCT 41.1  MCV 88.2  PLT 171   Cardiac Enzymes: No results for input(s): CKTOTAL, CKMB, CKMBINDEX, TROPONINI in the last 168 hours.  BNP (last 3 results) No results for input(s): BNP in the last 8760 hours.  ProBNP (last 3 results) No results for input(s): PROBNP in the last 8760 hours.   CREATININE: 1.41 mg/dL ABNORMAL (16/10/96 0454) Estimated creatinine clearance - 61 mL/min  CBG:  Recent Labs Lab 09/24/15 1115 09/24/15 1245  GLUCAP >600* >600*    Radiological Exams on Admission: No results found.    Assessment/Plan Active Problems:   DKA, type 1 (HCC)   Hypothyroidism   Essential hypertension   CKD (chronic kidney disease)  stage 2, GFR 60-89 ml/min   History of renal transplant  DKA: Corrected Na of 139 (129. glucose of 756) w/ Cl 95, CO2 23 Gap 21. Ketones urine. Respiratory compensation w/ ABG showing CO2 37. Pt w/ very poor insight into her condition. Does not titrate home dose based on glucose levels. Lengthy discussion had regarding home insulin administration - Tele - Obs - glucose stabilizer - IVF - KCl - resume home 70/30 when able. - DM education ordered.   HTN: - continue amlodipine, coreg  CKD: s/p renal transplant. Cr stable at 1.4 - continue prograf, prednisone, cellcept  Hypothyroid:  - continue synthroid    Chronic problem for pt.   Code Status: FULL  DVT Prophylaxis: lovenox Family Communication: none Disposition Plan: Pending Improvement    [name] Shela Commons, MD Family Medicine [facility] [email or web address] Password [email or web address]

## 2015-09-25 ENCOUNTER — Observation Stay (HOSPITAL_COMMUNITY): Payer: Medicare Other

## 2015-09-25 DIAGNOSIS — E1010 Type 1 diabetes mellitus with ketoacidosis without coma: Secondary | ICD-10-CM | POA: Diagnosis not present

## 2015-09-25 DIAGNOSIS — I10 Essential (primary) hypertension: Secondary | ICD-10-CM | POA: Diagnosis not present

## 2015-09-25 DIAGNOSIS — Z940 Kidney transplant status: Secondary | ICD-10-CM | POA: Diagnosis not present

## 2015-09-25 DIAGNOSIS — N182 Chronic kidney disease, stage 2 (mild): Secondary | ICD-10-CM | POA: Diagnosis not present

## 2015-09-25 LAB — CBC
HEMATOCRIT: 38.8 % (ref 36.0–46.0)
HEMOGLOBIN: 12.6 g/dL (ref 12.0–15.0)
MCH: 28.6 pg (ref 26.0–34.0)
MCHC: 32.5 g/dL (ref 30.0–36.0)
MCV: 88.2 fL (ref 78.0–100.0)
Platelets: 166 10*3/uL (ref 150–400)
RBC: 4.4 MIL/uL (ref 3.87–5.11)
RDW: 13.1 % (ref 11.5–15.5)
WBC: 4.9 10*3/uL (ref 4.0–10.5)

## 2015-09-25 LAB — MRSA PCR SCREENING: MRSA BY PCR: NEGATIVE

## 2015-09-25 LAB — COMPREHENSIVE METABOLIC PANEL
ALK PHOS: 96 U/L (ref 38–126)
ALT: 10 U/L — AB (ref 14–54)
ANION GAP: 8 (ref 5–15)
AST: 12 U/L — ABNORMAL LOW (ref 15–41)
Albumin: 2.7 g/dL — ABNORMAL LOW (ref 3.5–5.0)
BILIRUBIN TOTAL: 0.6 mg/dL (ref 0.3–1.2)
BUN: 23 mg/dL — ABNORMAL HIGH (ref 6–20)
CALCIUM: 9.3 mg/dL (ref 8.9–10.3)
CO2: 21 mmol/L — AB (ref 22–32)
CREATININE: 1.03 mg/dL — AB (ref 0.44–1.00)
Chloride: 108 mmol/L (ref 101–111)
Glucose, Bld: 203 mg/dL — ABNORMAL HIGH (ref 65–99)
Potassium: 3.9 mmol/L (ref 3.5–5.1)
SODIUM: 137 mmol/L (ref 135–145)
TOTAL PROTEIN: 6 g/dL — AB (ref 6.5–8.1)

## 2015-09-25 LAB — GLUCOSE, CAPILLARY
GLUCOSE-CAPILLARY: 167 mg/dL — AB (ref 65–99)
GLUCOSE-CAPILLARY: 192 mg/dL — AB (ref 65–99)
GLUCOSE-CAPILLARY: 288 mg/dL — AB (ref 65–99)
Glucose-Capillary: 152 mg/dL — ABNORMAL HIGH (ref 65–99)
Glucose-Capillary: 267 mg/dL — ABNORMAL HIGH (ref 65–99)
Glucose-Capillary: 287 mg/dL — ABNORMAL HIGH (ref 65–99)

## 2015-09-25 MED ORDER — INSULIN ASPART PROT & ASPART (70-30 MIX) 100 UNIT/ML ~~LOC~~ SUSP
15.0000 [IU] | Freq: Two times a day (BID) | SUBCUTANEOUS | Status: DC
  Administered 2015-09-25 – 2015-09-26 (×2): 15 [IU] via SUBCUTANEOUS
  Filled 2015-09-25: qty 10

## 2015-09-25 NOTE — Progress Notes (Signed)
Inpatient Diabetes Program Recommendations  AACE/ADA: New Consensus Statement on Inpatient Glycemic Control (2015)  Target Ranges:  Prepandial:   less than 140 mg/dL      Peak postprandial:   less than 180 mg/dL (1-2 hours)      Critically ill patients:  140 - 180 mg/dL   Review of Glycemic Control  Diabetes history: DM2 Outpatient Diabetes medications: 70/30 15 units bid Current orders for Inpatient glycemic control: Lantus 15 units QHS - received Lantus 15 units prior to discontinuation of insulin drip.  Results for [name], [name] (MRN [id number]) as of 09/25/2015 12:05  Ref. Range 03/02/2015 11:56  Hemoglobin A1C Latest Ref Range: 4.8-5.6 % 11.6 (H)  Results for [name], [name] (MRN [id number]) as of 09/25/2015 12:05  Ref. Range 09/24/2015 23:19 09/25/2015 00:23 09/25/2015 01:54 09/25/2015 07:48 09/25/2015 11:20  Glucose-Capillary Latest Ref Range: 65-99 mg/dL 578 (H) 469 (H) 629 (H) 192 (H) 267 (H)   Uncontrolled blood sugars. Pt states she gets her insulin at [facility] without a prescription (generic 70/30) Does not have a PCP to follow her diabetes management. Checks blood sugars "maybe once a day" and usually runs in high 200s. States she has dietary indiscretions - "I know what to do." This Coordinator has spoke to pt several times regarding importance of glycemic control to prevent complications. **Needs PCP  Inpatient Diabetes Program Recommendations:    Add Novolog 4 units tidwc for meal coverage insulin.  Will continue to follow while inpatient. Thank you. Ailene Ards, RD, LDN, CDE Inpatient Diabetes Coordinator [phone]

## 2015-09-25 NOTE — Progress Notes (Signed)
[facility] PROGRESS NOTE  [name]:[id number] DOB: 04-Mar-1966 DOA: 09/24/2015 PCP: [name], MD  Assessment/Plan: 50 y/o female with PMH of HTN, CKD, (h/o kidney transplant), IDDM, presented with URI symptoms and associated with nausea, vomiting diarrhea. Found to have DKA  1. DKA ? triggered by URI vs gastroenteritis on top of chronic steroid use.  -Resolved with IVF, IV insulin, transitioned to SQ insulin regimen.  2. IDDM. Uncontrolled. No recent ha1c. Resume home 70/30 at 15 BID. (Pt prefers this insulin)+ISS. Will check ha1c. May need mealtime insulin  3. HTN. Resume home regimen. Titrate as needed 4. Recent URI, still has cough. Obtain CXR r/o infection  5. CKD, (h/o kidney transplant). Resume home regimen   Code Status: full Family Communication: d/w patient, RN (indicate person spoken with, relationship, and if by phone, the number) Disposition Plan: home 24-48 hrs    Consultants:  none  Procedures:  none  Antibiotics:  none (indicate start date, and stop date if known)  HPI/Subjective: Alert. No distress   Objective: Filed Vitals:   09/25/15 0506 09/25/15 1000  BP: 147/65 150/73  Pulse: 71 70  Temp: 98.1 F (36.7 C) 98.2 F (36.8 C)  Resp: 19 18    Intake/Output Summary (Last 24 hours) at 09/25/15 1323 Last data filed at 09/25/15 0900  Gross per 24 hour  Intake 1823.34 ml  Output    200 ml  Net 1623.34 ml   Filed Weights   09/24/15 1100 09/24/15 2011  Weight: 102.059 kg (225 lb) 102.1 kg (225 lb 1.4 oz)    Exam:   General:  Alert   Cardiovascular: s1,s2 rrr  Respiratory: CTA BL   Abdomen: soft, nt,nd n  Musculoskeletal: no leg edema    Data Reviewed: Basic Metabolic Panel:  Recent Labs Lab 09/24/15 1155 09/24/15 1541 09/24/15 1750 09/24/15 2230 09/25/15 0544  NA 129* 132* 137 140 137  K 4.5 4.1 4.1 4.1 3.9  CL 95* 100* 105 107 108  CO2 23 20* 20* 22 21*  GLUCOSE 756* 650* 311* 136* 203*  BUN  29* 30* 28* 25* 23*  CREATININE 1.41* 1.23* 1.17* 0.95 1.03*  CALCIUM 9.9 9.3 9.7 9.4 9.3  MG  --  1.4*  --   --   --   PHOS  --  3.5  --   --   --    Liver Function Tests:  Recent Labs Lab 09/25/15 0544  AST 12*  ALT 10*  ALKPHOS 96  BILITOT 0.6  PROT 6.0*  ALBUMIN 2.7*   No results for input(s): LIPASE, AMYLASE in the last 168 hours. No results for input(s): AMMONIA in the last 168 hours. CBC:  Recent Labs Lab 09/24/15 1155 09/25/15 0544  WBC 5.0 4.9  HGB 13.3 12.6  HCT 41.1 38.8  MCV 88.2 88.2  PLT 171 166   Cardiac Enzymes: No results for input(s): CKTOTAL, CKMB, CKMBINDEX, TROPONINI in the last 168 hours. BNP (last 3 results) No results for input(s): BNP in the last 8760 hours.  ProBNP (last 3 results) No results for input(s): PROBNP in the last 8760 hours.  CBG:  Recent Labs Lab 09/24/15 2319 09/25/15 0023 09/25/15 0154 09/25/15 0748 09/25/15 1120  GLUCAP 159* 152* 167* 192* 267*    Recent Results (from the past 240 hour(s))  MRSA PCR Screening     Status: None   Collection Time: 09/24/15 10:57 PM  Result Value Ref Range Status   MRSA by PCR NEGATIVE NEGATIVE Final  Comment:        The GeneXpert MRSA Assay (FDA approved for NASAL specimens only), is one component of a comprehensive MRSA colonization surveillance program. It is not intended to diagnose MRSA infection nor to guide or monitor treatment for MRSA infections.      Studies: No results found.  Scheduled Meds: . amLODipine  5 mg Oral Daily  . carvedilol  50 mg Oral BID  . enoxaparin (LOVENOX) injection  40 mg Subcutaneous Q24H  . feeding supplement (GLUCERNA SHAKE)  237 mL Oral TID BM  . insulin aspart  0-15 Units Subcutaneous TID WC  . insulin aspart  0-5 Units Subcutaneous QHS  . insulin glargine  15 Units Subcutaneous QHS  . levothyroxine  137 mcg Oral QPM  . mycophenolate  500 mg Oral BID  . predniSONE  5 mg Oral Daily  . sodium chloride  3 mL Intravenous Q12H  .  tacrolimus  1 mg Oral BID   Continuous Infusions: . sodium chloride 100 mL/hr at 09/25/15 0820    Active Problems:   DKA, type 1 (HCC)   Hypothyroidism   Essential hypertension   CKD (chronic kidney disease) stage 2, GFR 60-89 ml/min   History of renal transplant    Time spent: >35 minutes     Esperanza Sheets  [facility] Pager [phone]. If 7PM-7AM, please contact night-coverage at [email or web address], password [email or web address] 09/25/2015, 1:23 PM

## 2015-09-26 DIAGNOSIS — Z940 Kidney transplant status: Secondary | ICD-10-CM | POA: Diagnosis not present

## 2015-09-26 DIAGNOSIS — N182 Chronic kidney disease, stage 2 (mild): Secondary | ICD-10-CM | POA: Diagnosis not present

## 2015-09-26 DIAGNOSIS — E1010 Type 1 diabetes mellitus with ketoacidosis without coma: Secondary | ICD-10-CM | POA: Diagnosis not present

## 2015-09-26 DIAGNOSIS — I10 Essential (primary) hypertension: Secondary | ICD-10-CM | POA: Diagnosis not present

## 2015-09-26 LAB — HEMOGLOBIN A1C
HEMOGLOBIN A1C: 11.6 % — AB (ref 4.8–5.6)
Mean Plasma Glucose: 286 mg/dL

## 2015-09-26 LAB — GLUCOSE, CAPILLARY
GLUCOSE-CAPILLARY: 231 mg/dL — AB (ref 65–99)
GLUCOSE-CAPILLARY: 211 mg/dL — AB (ref 65–99)

## 2015-09-26 MED ORDER — INSULIN LISPRO 100 UNIT/ML ~~LOC~~ SOLN: SUBCUTANEOUS | Status: DC

## 2015-09-26 NOTE — Discharge Summary (Signed)
Physician Discharge Summary  [name] NGE:[id number] DOB: 06-27-1966 DOA: 09/24/2015  PCP: [name], MD  Admit date: 09/24/2015 Discharge date: 09/26/2015  Time spent: >35 minutes  Recommendations for Outpatient Follow-up:  F/u with PCP in 1 week as needed Discharge Diagnoses:  Active Problems:   DKA, type 1 (HCC)   Hypothyroidism   Essential hypertension   CKD (chronic kidney disease) stage 2, GFR 60-89 ml/min   History of renal transplant   Discharge Condition: stable   Diet recommendation: DM. Low sodium   Filed Weights   09/24/15 1100 09/24/15 2011 09/26/15 0517  Weight: 102.059 kg (225 lb) 102.1 kg (225 lb 1.4 oz) 101.152 kg (223 lb)    History of present illness:  50 y/o female with PMH of HTN, CKD, (h/o kidney transplant), IDDM, presented with URI symptoms and associated with nausea, vomiting diarrhea. Found to have DKA  Hospital Course:  1. DKA ? triggered by URI vs gastroenteritis on top of chronic steroid use.  -DKA->Resolved with IVF, IV insulin, transitioned to SQ insulin regimen.  2. IDDM. Uncontrolled. ha1c-11.1. Glucose is bette controlled with after resuming 70/30 at 15 BID. (Pt prefers this insulin)+ISS. 3. HTN. Resume home regimen.  4. Recent URI. CXR without clear infiltrate  5. CKD, (h/o kidney transplant). Resume home regimen   Procedures:  none (i.e. Studies not automatically included, echos, thoracentesis, etc; not x-rays)  Consultations:  none  Discharge Exam: Filed Vitals:   09/26/15 0517 09/26/15 0833  BP: 137/73 162/69  Pulse: 71 69  Temp: 97.5 F (36.4 C) 97.8 F (36.6 C)  Resp: 21 18    General: alert. No distress  Cardiovascular: s1,s2 rrr Respiratory: CTA BL  Discharge Instructions  Discharge Instructions    Diet - low sodium heart healthy    Complete by:  As directed      Discharge instructions    Complete by:  As directed   Please follow up with primary care doctor in 1 week     Increase activity  slowly    Complete by:  As directed             Medication List    STOP taking these medications        oxyCODONE 5 MG immediate release tablet  Commonly known as:  Oxy IR/ROXICODONE      TAKE these medications        acetaminophen 500 MG tablet  Commonly known as:  TYLENOL  Take 500-1,000 mg by mouth every 6 (six) hours as needed for headache.     amLODipine 5 MG tablet  Commonly known as:  NORVASC  Take 1 tablet (5 mg total) by mouth daily.     carvedilol 25 MG tablet  Commonly known as:  COREG  Take 2 tablets (50 mg total) by mouth 2 (two) times daily.     feeding supplement (GLUCERNA SHAKE) Liqd  Take 237 mLs by mouth 3 (three) times daily between meals.     insulin lispro 100 UNIT/ML injection  Commonly known as:  HUMALOG  Sliding scale BG 70 - 120:  0 units.  BG 121 - 150:  1 unit.  BG 151 - 200:  2 units.  BG 201 - 250:  3 units. BG 251 - 300:  5 units. BG 301 - 350:  7 units     insulin NPH-regular Human (70-30) 100 UNIT/ML injection  Commonly known as:  NOVOLIN 70/30  Inject 15 Units into the skin 2 (two) times daily  with a meal.     levothyroxine 137 MCG tablet  Commonly known as:  SYNTHROID, LEVOTHROID  Take 1 tablet (137 mcg total) by mouth daily. BRAND NAME ONLY     mupirocin ointment 2 %  Commonly known as:  BACTROBAN  Place into the nose 2 (two) times daily.     mycophenolate 250 MG capsule  Commonly known as:  CELLCEPT  Take 500 mg by mouth 2 (two) times daily.     predniSONE 5 MG tablet  Commonly known as:  DELTASONE  Take 5 mg by mouth daily.     ranitidine 150 MG capsule  Commonly known as:  ZANTAC  Take 150 mg by mouth daily as needed for heartburn.     tacrolimus 1 MG capsule  Commonly known as:  PROGRAF  Take 1 mg by mouth 2 (two) times daily.     TYLENOL COLD 30-15-200-325 MG Tabs  Generic drug:  Pseudoephedrine-DM-GG-APAP  Take 2 tablets by mouth every 4 (four) hours as needed. For cold & flu symptoms       Allergies   Allergen Reactions  . Ace Inhibitors Cough    Lisinopril  . Adhesive [Tape] Itching      The results of significant diagnostics from this hospitalization (including imaging, microbiology, ancillary and laboratory) are listed below for reference.    Significant Diagnostic Studies: Dg Chest 2 View  09/25/2015  CLINICAL DATA:  50 year old female with cough for 1 week and rhinitis. Initial encounter. EXAM: CHEST  2 VIEW COMPARISON:  01/02/2015 and earlier. FINDINGS: Lung volumes remain normal. Normal cardiac size and mediastinal contours. Visualized tracheal air column is within normal limits. There is a new small pleural effusion evident on the lateral view in the costophrenic angle, probably on the left. No pneumothorax or pulmonary edema. No consolidation or other acute pulmonary opacity. Mildly increased interstitial markings are stable. No acute osseous abnormality identified. IMPRESSION: New small pleural effusion probably on the left. No associated pneumonia or other acute cardiopulmonary abnormality Identified. Electronically Signed   By: Odessa Fleming M.D.   On: 09/25/2015 13:59    Microbiology: Recent Results (from the past 240 hour(s))  MRSA PCR Screening     Status: None   Collection Time: 09/24/15 10:57 PM  Result Value Ref Range Status   MRSA by PCR NEGATIVE NEGATIVE Final    Comment:        The GeneXpert MRSA Assay (FDA approved for NASAL specimens only), is one component of a comprehensive MRSA colonization surveillance program. It is not intended to diagnose MRSA infection nor to guide or monitor treatment for MRSA infections.      Labs: Basic Metabolic Panel:  Recent Labs Lab 09/24/15 1155 09/24/15 1541 09/24/15 1750 09/24/15 2230 09/25/15 0544  NA 129* 132* 137 140 137  K 4.5 4.1 4.1 4.1 3.9  CL 95* 100* 105 107 108  CO2 23 20* 20* 22 21*  GLUCOSE 756* 650* 311* 136* 203*  BUN 29* 30* 28* 25* 23*  CREATININE 1.41* 1.23* 1.17* 0.95 1.03*  CALCIUM 9.9 9.3  9.7 9.4 9.3  MG  --  1.4*  --   --   --   PHOS  --  3.5  --   --   --    Liver Function Tests:  Recent Labs Lab 09/25/15 0544  AST 12*  ALT 10*  ALKPHOS 96  BILITOT 0.6  PROT 6.0*  ALBUMIN 2.7*   No results for input(s): LIPASE, AMYLASE in the last 168  hours. No results for input(s): AMMONIA in the last 168 hours. CBC:  Recent Labs Lab 09/24/15 1155 09/25/15 0544  WBC 5.0 4.9  HGB 13.3 12.6  HCT 41.1 38.8  MCV 88.2 88.2  PLT 171 166   Cardiac Enzymes: No results for input(s): CKTOTAL, CKMB, CKMBINDEX, TROPONINI in the last 168 hours. BNP: BNP (last 3 results) No results for input(s): BNP in the last 8760 hours.  ProBNP (last 3 results) No results for input(s): PROBNP in the last 8760 hours.  CBG:  Recent Labs Lab 09/25/15 0748 09/25/15 1120 09/25/15 1623 09/25/15 2027 09/26/15 0747  GLUCAP 192* 267* 287* 288* 231*       Signed:  Jonette Mate N  [facility] 09/26/2015, 10:14 AM

## 2015-09-26 NOTE — Progress Notes (Signed)
[name] to be D/C'd Home per MD order.  Discussed prescriptions and follow up appointments with the patient. Prescriptions given to patient, medication list explained in detail. Pt verbalized understanding.    Medication List    STOP taking these medications        oxyCODONE 5 MG immediate release tablet  Commonly known as:  Oxy IR/ROXICODONE      TAKE these medications        acetaminophen 500 MG tablet  Commonly known as:  TYLENOL  Take 500-1,000 mg by mouth every 6 (six) hours as needed for headache.     amLODipine 5 MG tablet  Commonly known as:  NORVASC  Take 1 tablet (5 mg total) by mouth daily.     carvedilol 25 MG tablet  Commonly known as:  COREG  Take 2 tablets (50 mg total) by mouth 2 (two) times daily.     feeding supplement (GLUCERNA SHAKE) Liqd  Take 237 mLs by mouth 3 (three) times daily between meals.     insulin lispro 100 UNIT/ML injection  Commonly known as:  HUMALOG  Sliding scale BG 70 - 120:  0 units.  BG 121 - 150:  1 unit.  BG 151 - 200:  2 units.  BG 201 - 250:  3 units. BG 251 - 300:  5 units. BG 301 - 350:  7 units     insulin NPH-regular Human (70-30) 100 UNIT/ML injection  Commonly known as:  NOVOLIN 70/30  Inject 15 Units into the skin 2 (two) times daily with a meal.     levothyroxine 137 MCG tablet  Commonly known as:  SYNTHROID, LEVOTHROID  Take 1 tablet (137 mcg total) by mouth daily. BRAND NAME ONLY     mupirocin ointment 2 %  Commonly known as:  BACTROBAN  Place into the nose 2 (two) times daily.     mycophenolate 250 MG capsule  Commonly known as:  CELLCEPT  Take 500 mg by mouth 2 (two) times daily.     predniSONE 5 MG tablet  Commonly known as:  DELTASONE  Take 5 mg by mouth daily.     ranitidine 150 MG capsule  Commonly known as:  ZANTAC  Take 150 mg by mouth daily as needed for heartburn.     tacrolimus 1 MG capsule  Commonly known as:  PROGRAF  Take 1 mg by mouth 2 (two) times daily.     TYLENOL COLD  30-15-200-325 MG Tabs  Generic drug:  Pseudoephedrine-DM-GG-APAP  Take 2 tablets by mouth every 4 (four) hours as needed. For cold & flu symptoms        Filed Vitals:   09/26/15 0517 09/26/15 0833  BP: 137/73 162/69  Pulse: 71 69  Temp: 97.5 F (36.4 C) 97.8 F (36.6 C)  Resp: 21 18    Skin clean, dry and intact without evidence of skin break down, no evidence of skin tears noted. IV catheter discontinued intact. Site without signs and symptoms of complications. Dressing and pressure applied. Pt denies pain at this time. No complaints noted.  An After Visit Summary was printed and given to the patient. Patient escorted via WC, and D/C home via private auto.  Janeann Forehand BSN, RN

## 2015-10-05 IMAGING — US US RENAL
1 series · 14 of 25 positions shown · non-contrast
Comparison: 05/05/2014

CLINICAL DATA: "Acute kidney injury" . Right lower quadrant renal
transplant.

EXAM:
RENAL / URINARY TRACT ULTRASOUND COMPLETE

[Series 1: us renal · 0.23mm/px · 14 of 31 slices shown]
[im 1/31]
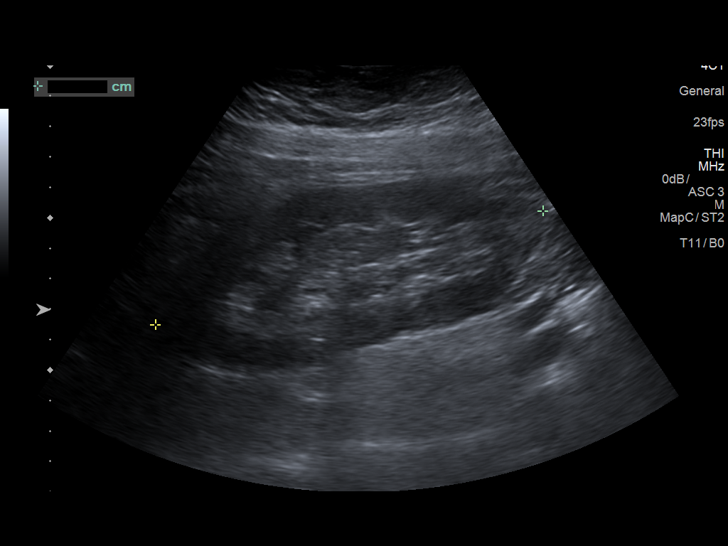
[im 3/31]
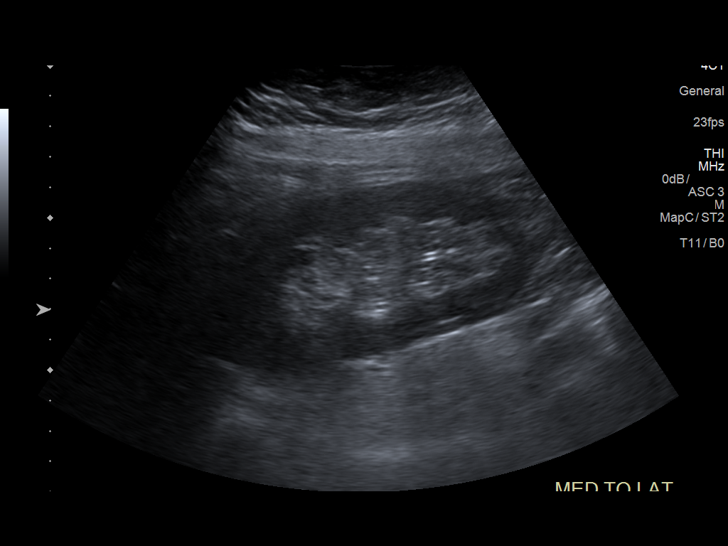
[im 6/31]
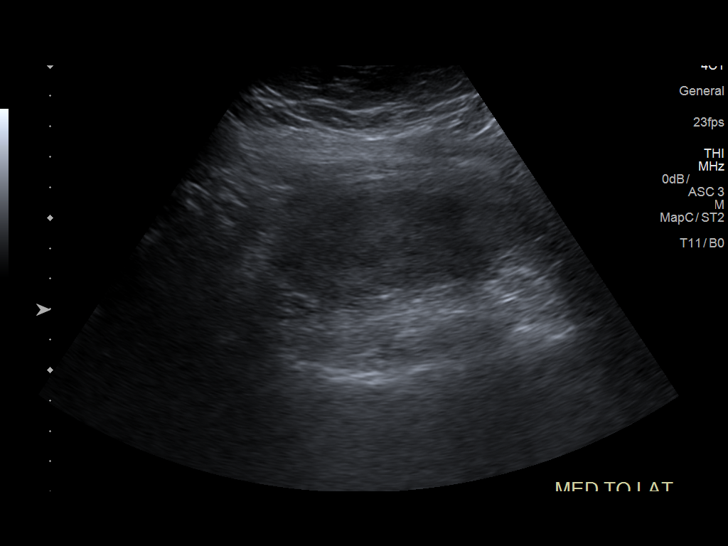
[im 8/31]
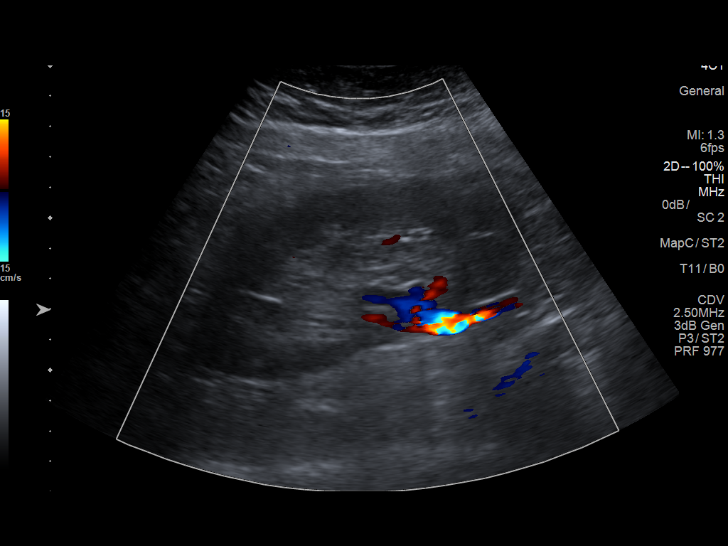
[im 11/31]
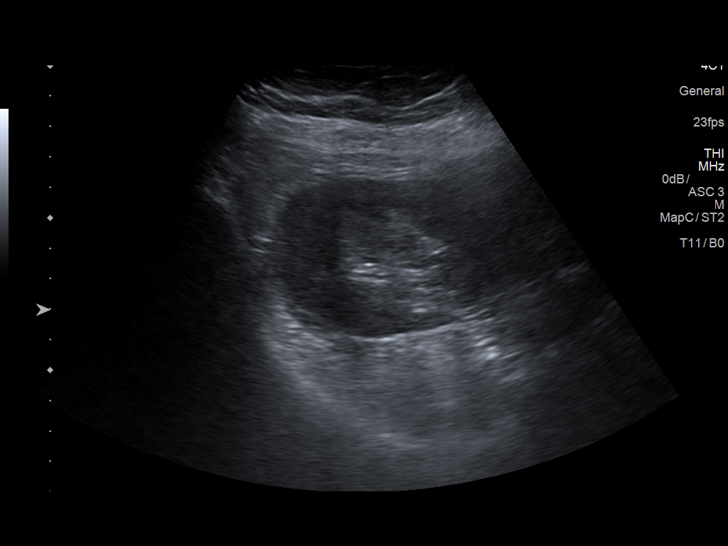
[im 12/31]
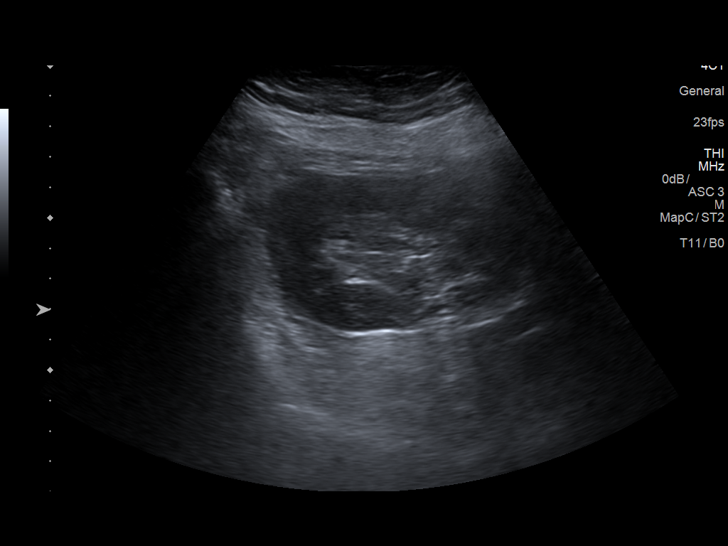
[im 14/31]
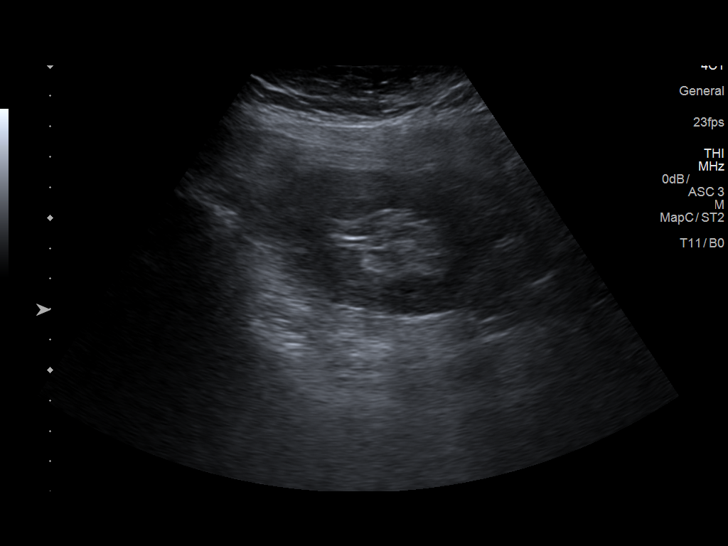
[im 17/31]
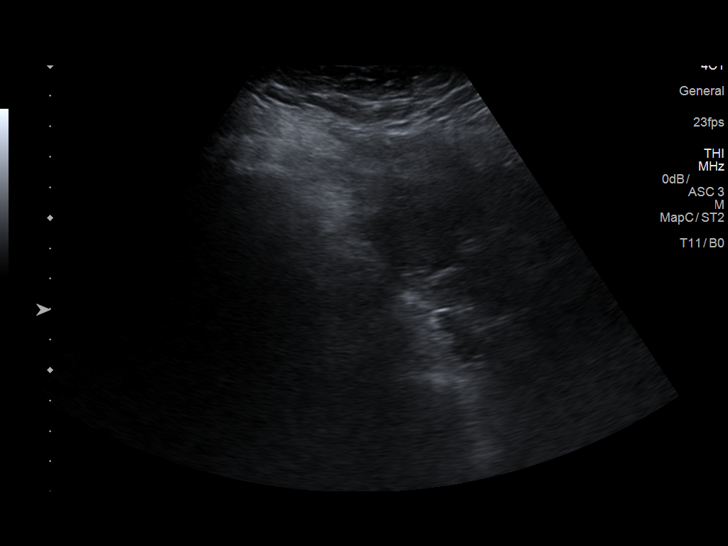
[im 19/31]
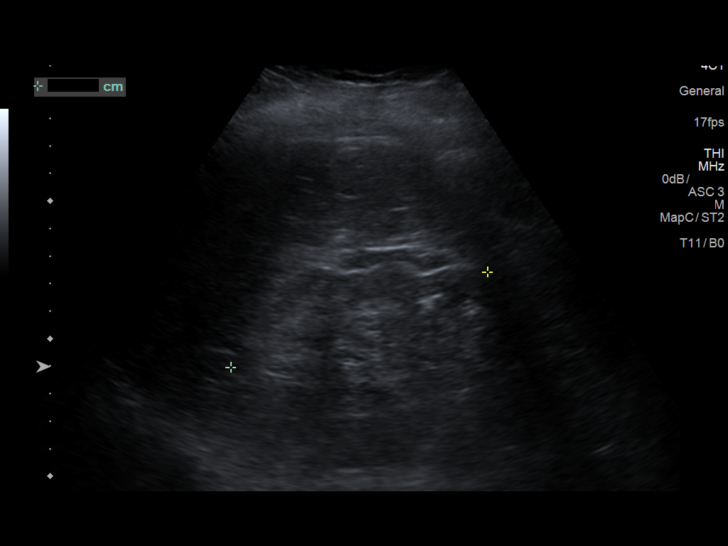
[im 21/31]
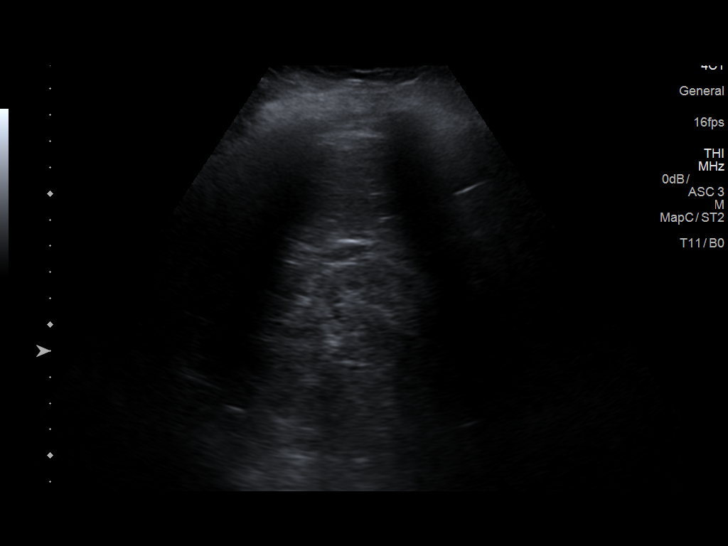
[im 23/31]
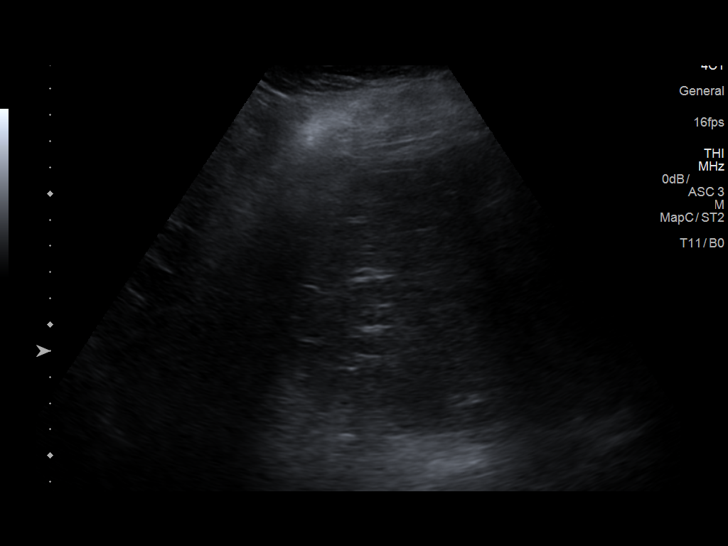
[im 26/31]
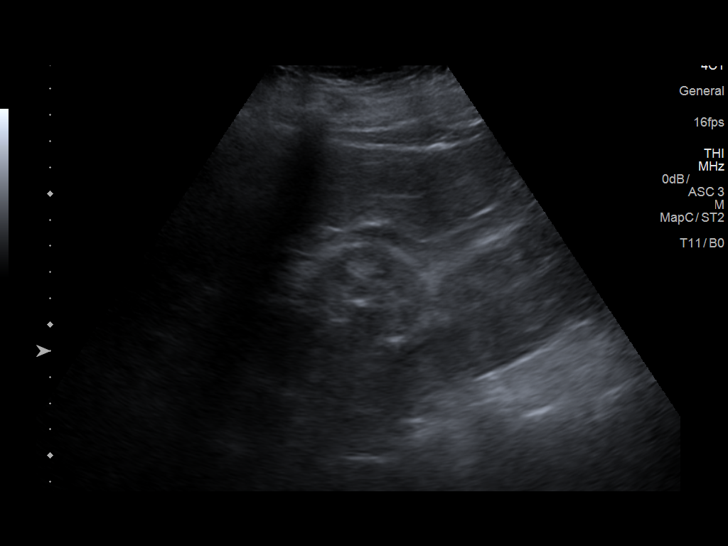
[im 28/31]
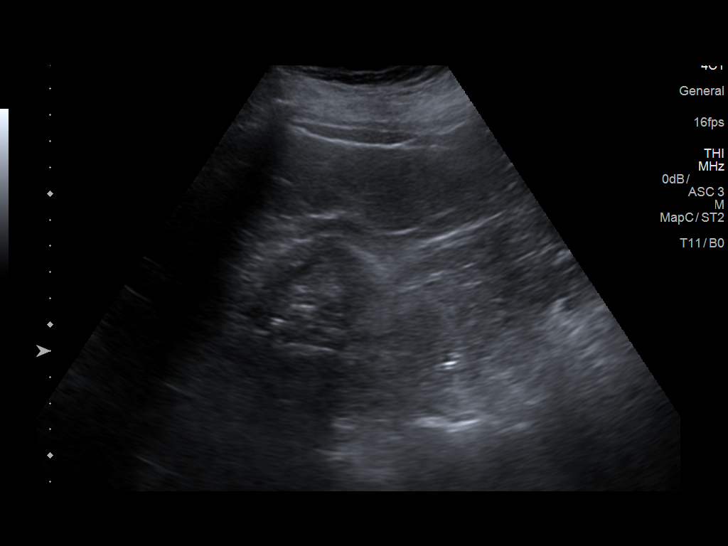
[im 31/31]
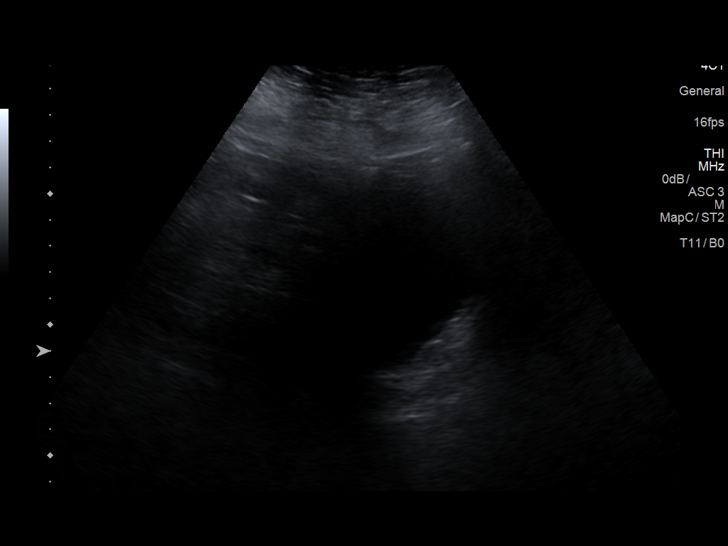

[14 of 25 positions shown; findings below may reference images not displayed]

FINDINGS: Right Kidney:

Length: 10.0 cm. Increased cortical echogenicity and decreased
cortical thickness. No hydronephrosis.

Left Kidney:

Length: 8.7 cm. Moderate renal atrophy. Increased echogenicity.. No
hydronephrosis.

Right lower quadrant renal transplant is within normal limits,
measuring 13.3 cm in greatest dimension. No hydronephrosis or
perinephric fluid collection.

Bladder:

Appears normal for degree of bladder distention.
IMPRESSION: 1. Normal appearance of the right lower quadrant renal transplant.
2. Expected atrophy and increased cortical echogenicity involving
the native kidneys.

## 2015-11-02 ENCOUNTER — Encounter (HOSPITAL_COMMUNITY): Payer: Self-pay | Admitting: *Deleted

## 2015-11-02 ENCOUNTER — Emergency Department (HOSPITAL_COMMUNITY): Payer: Medicare Other

## 2015-11-02 ENCOUNTER — Inpatient Hospital Stay (HOSPITAL_COMMUNITY)
Admission: EM | Admit: 2015-11-02 | Discharge: 2015-11-06 | DRG: 638 | Disposition: A | Discharge status: Home / Self Care | Payer: Medicare Other | Attending: Internal Medicine | Admitting: Internal Medicine

## 2015-11-02 DIAGNOSIS — Z794 Long term (current) use of insulin: Secondary | ICD-10-CM

## 2015-11-02 DIAGNOSIS — Z940 Kidney transplant status: Secondary | ICD-10-CM | POA: Diagnosis not present

## 2015-11-02 DIAGNOSIS — K219 Gastro-esophageal reflux disease without esophagitis: Secondary | ICD-10-CM | POA: Diagnosis present

## 2015-11-02 DIAGNOSIS — Z825 Family history of asthma and other chronic lower respiratory diseases: Secondary | ICD-10-CM | POA: Diagnosis not present

## 2015-11-02 DIAGNOSIS — Z9109 Other allergy status, other than to drugs and biological substances: Secondary | ICD-10-CM | POA: Diagnosis not present

## 2015-11-02 DIAGNOSIS — Z961 Presence of intraocular lens: Secondary | ICD-10-CM | POA: Diagnosis present

## 2015-11-02 DIAGNOSIS — F329 Major depressive disorder, single episode, unspecified: Secondary | ICD-10-CM | POA: Diagnosis present

## 2015-11-02 DIAGNOSIS — D696 Thrombocytopenia, unspecified: Secondary | ICD-10-CM | POA: Diagnosis present

## 2015-11-02 DIAGNOSIS — Z9842 Cataract extraction status, left eye: Secondary | ICD-10-CM

## 2015-11-02 DIAGNOSIS — Z87891 Personal history of nicotine dependence: Secondary | ICD-10-CM

## 2015-11-02 DIAGNOSIS — I129 Hypertensive chronic kidney disease with stage 1 through stage 4 chronic kidney disease, or unspecified chronic kidney disease: Secondary | ICD-10-CM | POA: Diagnosis present

## 2015-11-02 DIAGNOSIS — E039 Hypothyroidism, unspecified: Secondary | ICD-10-CM | POA: Diagnosis present

## 2015-11-02 DIAGNOSIS — N183 Chronic kidney disease, stage 3 (moderate): Secondary | ICD-10-CM | POA: Diagnosis present

## 2015-11-02 DIAGNOSIS — Z9841 Cataract extraction status, right eye: Secondary | ICD-10-CM | POA: Diagnosis not present

## 2015-11-02 DIAGNOSIS — Z808 Family history of malignant neoplasm of other organs or systems: Secondary | ICD-10-CM

## 2015-11-02 DIAGNOSIS — Z823 Family history of stroke: Secondary | ICD-10-CM

## 2015-11-02 DIAGNOSIS — I120 Hypertensive chronic kidney disease with stage 5 chronic kidney disease or end stage renal disease: Secondary | ICD-10-CM | POA: Diagnosis present

## 2015-11-02 DIAGNOSIS — I10 Essential (primary) hypertension: Secondary | ICD-10-CM | POA: Diagnosis present

## 2015-11-02 DIAGNOSIS — E1010 Type 1 diabetes mellitus with ketoacidosis without coma: Secondary | ICD-10-CM | POA: Diagnosis not present

## 2015-11-02 DIAGNOSIS — Z7952 Long term (current) use of systemic steroids: Secondary | ICD-10-CM

## 2015-11-02 DIAGNOSIS — N182 Chronic kidney disease, stage 2 (mild): Secondary | ICD-10-CM | POA: Diagnosis not present

## 2015-11-02 DIAGNOSIS — E860 Dehydration: Secondary | ICD-10-CM | POA: Diagnosis present

## 2015-11-02 DIAGNOSIS — Z888 Allergy status to other drugs, medicaments and biological substances status: Secondary | ICD-10-CM | POA: Diagnosis not present

## 2015-11-02 DIAGNOSIS — M1990 Unspecified osteoarthritis, unspecified site: Secondary | ICD-10-CM | POA: Diagnosis present

## 2015-11-02 DIAGNOSIS — Z79899 Other long term (current) drug therapy: Secondary | ICD-10-CM

## 2015-11-02 DIAGNOSIS — E1110 Type 2 diabetes mellitus with ketoacidosis without coma: Secondary | ICD-10-CM | POA: Diagnosis present

## 2015-11-02 DIAGNOSIS — E875 Hyperkalemia: Secondary | ICD-10-CM | POA: Diagnosis present

## 2015-11-02 DIAGNOSIS — E1310 Other specified diabetes mellitus with ketoacidosis without coma: Principal | ICD-10-CM | POA: Diagnosis present

## 2015-11-02 DIAGNOSIS — E0811 Diabetes mellitus due to underlying condition with ketoacidosis with coma: Secondary | ICD-10-CM | POA: Diagnosis not present

## 2015-11-02 DIAGNOSIS — R1110 Vomiting, unspecified: Secondary | ICD-10-CM | POA: Diagnosis present

## 2015-11-02 LAB — COMPREHENSIVE METABOLIC PANEL
ALT: 15 U/L (ref 14–54)
ANION GAP: 23 — AB (ref 5–15)
AST: 15 U/L (ref 15–41)
Albumin: 3.1 g/dL — ABNORMAL LOW (ref 3.5–5.0)
Alkaline Phosphatase: 122 U/L (ref 38–126)
BILIRUBIN TOTAL: 1.8 mg/dL — AB (ref 0.3–1.2)
BUN: 47 mg/dL — AB (ref 6–20)
CHLORIDE: 96 mmol/L — AB (ref 101–111)
CO2: 8 mmol/L — ABNORMAL LOW (ref 22–32)
CREATININE: 1.97 mg/dL — AB (ref 0.44–1.00)
Calcium: 9.4 mg/dL (ref 8.9–10.3)
GFR, EST AFRICAN AMERICAN: 33 mL/min — AB (ref >60)
GFR, EST NON AFRICAN AMERICAN: 29 mL/min — AB (ref >60)
Glucose, Bld: 696 mg/dL (ref 65–99)
POTASSIUM: 6.2 mmol/L — AB (ref 3.5–5.1)
Sodium: 127 mmol/L — ABNORMAL LOW (ref 135–145)
Total Protein: 6.6 g/dL (ref 6.5–8.1)

## 2015-11-02 LAB — CBG MONITORING, ED

## 2015-11-02 LAB — BLOOD GAS, VENOUS
ACID-BASE DEFICIT: 20.7 mmol/L — AB (ref 0.0–2.0)
Bicarbonate: 8.3 mEq/L — ABNORMAL LOW (ref 20.0–24.0)
Drawn by: 344551
O2 SAT: 68.3 %
PH VEN: 7.093 — AB (ref 7.250–7.300)
Patient temperature: 98.6
TCO2: 8.1 mmol/L (ref 0–100)
pCO2, Ven: 28.4 mmHg — ABNORMAL LOW (ref 45.0–50.0)
pO2, Ven: 45.7 mmHg — ABNORMAL HIGH (ref 30.0–45.0)

## 2015-11-02 LAB — CBC WITH DIFFERENTIAL/PLATELET
BASOS ABS: 0 10*3/uL (ref 0.0–0.1)
Basophils Relative: 0 %
EOS PCT: 0 %
Eosinophils Absolute: 0 10*3/uL (ref 0.0–0.7)
HEMATOCRIT: 41.9 % (ref 36.0–46.0)
Hemoglobin: 13.1 g/dL (ref 12.0–15.0)
LYMPHS ABS: 0.6 10*3/uL — AB (ref 0.7–4.0)
LYMPHS PCT: 7 %
MCH: 29.2 pg (ref 26.0–34.0)
MCHC: 31.3 g/dL (ref 30.0–36.0)
MCV: 93.3 fL (ref 78.0–100.0)
MONO ABS: 0.2 10*3/uL (ref 0.1–1.0)
MONOS PCT: 3 %
NEUTROS ABS: 7 10*3/uL (ref 1.7–7.7)
Neutrophils Relative %: 90 %
PLATELETS: 194 10*3/uL (ref 150–400)
RBC: 4.49 MIL/uL (ref 3.87–5.11)
RDW: 13 % (ref 11.5–15.5)
WBC: 7.9 10*3/uL (ref 4.0–10.5)

## 2015-11-02 MED ORDER — DEXTROSE-NACL 5-0.45 % IV SOLN: INTRAVENOUS | Status: DC

## 2015-11-02 MED ORDER — FENTANYL CITRATE (PF) 100 MCG/2ML IJ SOLN
25.0000 ug | Freq: Once | INTRAMUSCULAR | Status: AC
  Administered 2015-11-03: 25 ug via INTRAVENOUS
  Filled 2015-11-02: qty 2

## 2015-11-02 MED ORDER — ONDANSETRON HCL 4 MG/2ML IJ SOLN
4.0000 mg | Freq: Once | INTRAMUSCULAR | Status: AC
  Administered 2015-11-03: 4 mg via INTRAVENOUS
  Filled 2015-11-02: qty 2

## 2015-11-02 MED ORDER — INSULIN ASPART 100 UNIT/ML ~~LOC~~ SOLN
10.0000 [IU] | Freq: Once | SUBCUTANEOUS | Status: AC
  Administered 2015-11-02: 10 [IU] via SUBCUTANEOUS
  Filled 2015-11-02: qty 1

## 2015-11-02 MED ORDER — ONDANSETRON HCL 4 MG/2ML IJ SOLN
4.0000 mg | Freq: Once | INTRAMUSCULAR | Status: AC
Start: 2015-11-02 — End: 2015-11-02
  Administered 2015-11-02: 4 mg via INTRAVENOUS
  Filled 2015-11-02: qty 2

## 2015-11-02 MED ORDER — SODIUM CHLORIDE 0.9 % IV BOLUS (SEPSIS)
1000.0000 mL | Freq: Once | INTRAVENOUS | Status: AC
  Administered 2015-11-02: 1000 mL via INTRAVENOUS

## 2015-11-02 MED ORDER — SODIUM CHLORIDE 0.9 % IV SOLN
INTRAVENOUS | Status: DC
  Administered 2015-11-02: 5.4 [IU]/h via INTRAVENOUS
  Filled 2015-11-02: qty 2.5

## 2015-11-02 NOTE — ED Notes (Signed)
Per [facility], pt has been sick the past 2-3 days and has only been sipping on water.  Did not take evening dose of insulin.  Has a liter of fluid.  Has had cough sneezing, vomiting & diarrhea.

## 2015-11-02 NOTE — ED Notes (Signed)
Pt stated "I don't have a primary care doctor.  I just buy my insulin."  Questioned how she knew the dose needed.  Pt stated "the doctor's @ the hospital tell me."

## 2015-11-02 NOTE — ED Notes (Signed)
Pt remains on monitor. 

## 2015-11-02 NOTE — ED Notes (Signed)
Awaiting channel for pump.  Art has requested.

## 2015-11-02 NOTE — ED Provider Notes (Signed)
CSN: [id number]     Arrival date & time 11/02/15  1959 History   First MD Initiated Contact with Patient 11/02/15 2004     Chief Complaint  Patient presents with  . Hyperglycemia     (Consider location/radiation/quality/duration/timing/severity/associated sxs/prior Treatment) Patient is a 50 y.o. female presenting with hyperglycemia.  Hyperglycemia Blood sugar level PTA:  'HIGH' Severity:  Mild Onset quality:  Gradual Timing:  Constant Chronicity:  New Diabetes status:  Non-diabetic Current diabetic therapy:  Insulin Context: noncompliance   Relieved by:  None tried Ineffective treatments:  None tried Associated symptoms: no chest pain, no dizziness, no fatigue, no fever, no increased thirst, no polyuria and no shortness of breath     Past Medical History  Diagnosis Date  . Hypertension   . Hypothyroid   . 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 Pneumonia ~ 2007?  [name] Type II diabetes mellitus (HCC)     Insulin dependant  . Daily headache   . Arthritis     "elbows, knees, legs, back" (09/24/2015)  . Depression     "years ago"  . Kidney disease   . End stage renal disease (HCC)     right arm AV graft, post transplant   Past Surgical History  Procedure Laterality Date  . Back surgery    . Tubal ligation  07/1999  . Dg av dialysis graft declot or    . Nephrectomy transplanted organ    . Kidney transplant Right December 16, 2009  . Toe amputation Right     1,2 & 3rd toes.  . Cataract extraction w/ intraocular lens  implant, bilateral Bilateral   . Amputation Left 05/11/2014    Procedure: AMPUTATION LEFT GREAT TOE;  Surgeon: Kathryne Hitch, MD;  Location: [facility];  Service: Orthopedics;  Laterality: Left;  . Incision and drainage Right 06/02/2015    Procedure: INCISION AND DRAINAGE OF RIGHT 3rd RAY RESECTION;  Surgeon: Kathryne Hitch, MD;  Location: [facility] OR;  Service: Orthopedics;  Laterality: Right;  . Lumbar disc surgery  2001  .  Cesarean section  07/1999  . Av fistula placement Right     forearm   Family History  Problem Relation Age of Onset  . Emphysema Mother   . Throat cancer Mother   . COPD Mother   . Cancer Mother   . Emphysema Father   . COPD Father   . Stroke Father   . ADD / ADHD Son    Social History  Substance Use Topics  . Smoking status: Former Smoker -- 1.00 packs/day for 11 years    Types: Cigarettes    Quit date: 09/21/1998  . Smokeless tobacco: Never Used  . Alcohol Use: No   OB History    No data available     Review of Systems  Constitutional: Negative for fever and fatigue.  HENT: Negative for congestion.   Eyes: Negative for pain.  Respiratory: Negative for cough and shortness of breath.   Cardiovascular: Negative for chest pain.  Endocrine: Negative for polydipsia and polyuria.  Musculoskeletal: Negative for back pain.  Neurological: Negative for dizziness and headaches.  All other systems reviewed and are negative.     Allergies  Ace inhibitors and Adhesive  Home Medications   Prior to Admission medications   Medication Sig Start Date End Date Taking? Authorizing Provider  acetaminophen (TYLENOL) 500 MG tablet Take 500-1,000 mg by mouth every 6 (six) hours as needed for headache.  Yes Historical Provider, MD  amLODipine (NORVASC) 5 MG tablet Take 1 tablet (5 mg total) by mouth daily. 03/04/15  Yes Alison Murray, MD  carvedilol (COREG) 25 MG tablet Take 2 tablets (50 mg total) by mouth 2 (two) times daily. 03/04/15  Yes Alison Murray, MD  feeding supplement, GLUCERNA SHAKE, (GLUCERNA SHAKE) LIQD Take 237 mLs by mouth 3 (three) times daily between meals. 06/04/15  Yes Nishant Dhungel, MD  insulin lispro (HUMALOG) 100 UNIT/ML injection Sliding scale BG 70 - 120:  0 units.  BG 121 - 150:  1 unit.  BG 151 - 200:  2 units.  BG 201 - 250:  3 units. BG 251 - 300:  5 units. BG 301 - 350:  7 units 09/26/15  Yes Esperanza Sheets, MD  insulin NPH-regular Human (NOVOLIN 70/30)  (70-30) 100 UNIT/ML injection Inject 15 Units into the skin 2 (two) times daily with a meal. 06/06/15  Yes Starleen Arms, MD  levothyroxine (SYNTHROID, LEVOTHROID) 137 MCG tablet Take 1 tablet (137 mcg total) by mouth daily. BRAND NAME ONLY Patient taking differently: Take 137 mcg by mouth every evening. BRAND NAME ONLY 03/04/15  Yes Alison Murray, MD  mycophenolate (CELLCEPT) 250 MG capsule Take 500 mg by mouth 2 (two) times daily.    Yes Historical Provider, MD  predniSONE (DELTASONE) 5 MG tablet Take 5 mg by mouth daily. 05/29/15  Yes Historical Provider, MD  ranitidine (ZANTAC) 150 MG capsule Take 150 mg by mouth daily as needed for heartburn.    Yes Historical Provider, MD  tacrolimus (PROGRAF) 1 MG capsule Take 1 mg by mouth 2 (two) times daily.    Yes Historical Provider, MD  mupirocin ointment (BACTROBAN) 2 % Place into the nose 2 (two) times daily. Patient not taking: Reported on 09/24/2015 06/04/15   Nishant Dhungel, MD   BP 108/90 mmHg  Pulse 78  Temp(Src) 97.4 F (36.3 C) (Oral)  Resp 18  Ht  (1.727 m)  Wt 215 lb (97.523 kg)  BMI 32.70 kg/m2  SpO2 97%  LMP 11/19/2011 Physical Exam  Constitutional: She is oriented to person, place, and time. She appears well-developed and well-nourished.  HENT:  Head: Normocephalic and atraumatic.  Neck: Normal range of motion.  Cardiovascular: Normal rate and regular rhythm.   Pulmonary/Chest: Effort normal. No stridor. No respiratory distress. She has no wheezes.  Abdominal: Soft. Bowel sounds are normal. She exhibits no distension.  Musculoskeletal: Normal range of motion. She exhibits no edema or tenderness.  Neurological: She is alert and oriented to person, place, and time. No cranial nerve deficit. Coordination normal.  Skin: Skin is warm and dry. No erythema.  Nursing note and vitals reviewed.   ED Course  Procedures (including critical care time)  CRITICAL CARE Performed by: Marily Memos  Total critical care time: 30  minutes Critical care time was exclusive of separately billable procedures and treating other patients. Critical care was necessary to treat or prevent imminent or life-threatening deterioration. Critical care was time spent personally by me on the following activities: development of treatment plan with patient and/or surrogate as well as nursing, discussions with consultants, evaluation of patient's response to treatment, examination of patient, obtaining history from patient or surrogate, ordering and performing treatments and interventions, ordering and review of laboratory studies, ordering and review of radiographic studies, pulse oximetry and re-evaluation of patient's condition.   Labs Review Labs Reviewed  CBC WITH DIFFERENTIAL/PLATELET - Abnormal; Notable for the following:  Lymphs Abs 0.6 (*)    All other components within normal limits  COMPREHENSIVE METABOLIC PANEL - Abnormal; Notable for the following:    Sodium 127 (*)    Potassium 6.2 (*)    Chloride 96 (*)    CO2 8 (*)    Glucose, Bld 696 (*)    BUN 47 (*)    Creatinine, Ser 1.97 (*)    Albumin 3.1 (*)    Total Bilirubin 1.8 (*)    GFR calc non Af Amer 29 (*)    GFR calc Af Amer 33 (*)    Anion gap 23 (*)    All other components within normal limits  BLOOD GAS, VENOUS - Abnormal; Notable for the following:    pH, Ven 7.093 (*)    pCO2, Ven 28.4 (*)    pO2, Ven 45.7 (*)    Bicarbonate 8.3 (*)    Acid-base deficit 20.7 (*)    All other components within normal limits  CBG MONITORING, ED - Abnormal; Notable for the following:    Glucose-Capillary >600 (*)    All other components within normal limits  HEMOGLOBIN A1C    Imaging Review Dg Chest 2 View  11/02/2015  CLINICAL DATA:  Evaluate for pneumonia.  Cough and sneezing. EXAM: CHEST  2 VIEW COMPARISON:  Chest radiograph 09/25/2015. FINDINGS: The heart size and mediastinal contours are within normal limits. Both lungs are clear. The visualized skeletal  structures are unremarkable. IMPRESSION: No active cardiopulmonary disease. Electronically Signed   By: Annia Belt M.D.   On: 11/02/2015 21:04   I have personally reviewed and evaluated these images and lab results as part of my medical decision-making.   EKG Interpretation   Date/Time:  Saturday November 02 2015 20:31:05 EST Ventricular Rate:  77 PR Interval:  136 QRS Duration: 146 QT Interval:  474 QTC Calculation: 536 R Axis:   149 Text Interpretation:  Sinus rhythm Right bundle branch block similar to  09/24/15 Confirmed by [facility] MD, Barbara Cower [phone]) on 11/02/2015 8:47:05 PM      MDM   Final diagnoses:  Diabetic ketoacidosis without coma associated with type 1 diabetes mellitus (HCC)    Here with DKA likely 2/2 noncompliance. Slightly high K, will likely correct with insulin infusion. No ischemia on ecg. No e/o infection otherwise. Will admit to stepdown.     Marily Memos, MD 11/02/15 [phone]

## 2015-11-02 NOTE — ED Notes (Signed)
Dialed [phone], spoke with Trinna Post.

## 2015-11-03 DIAGNOSIS — Z940 Kidney transplant status: Secondary | ICD-10-CM

## 2015-11-03 DIAGNOSIS — E1310 Other specified diabetes mellitus with ketoacidosis without coma: Principal | ICD-10-CM

## 2015-11-03 DIAGNOSIS — E875 Hyperkalemia: Secondary | ICD-10-CM

## 2015-11-03 DIAGNOSIS — N182 Chronic kidney disease, stage 2 (mild): Secondary | ICD-10-CM

## 2015-11-03 DIAGNOSIS — K219 Gastro-esophageal reflux disease without esophagitis: Secondary | ICD-10-CM

## 2015-11-03 DIAGNOSIS — I10 Essential (primary) hypertension: Secondary | ICD-10-CM

## 2015-11-03 DIAGNOSIS — E039 Hypothyroidism, unspecified: Secondary | ICD-10-CM

## 2015-11-03 LAB — BASIC METABOLIC PANEL
ANION GAP: 9 (ref 5–15)
Anion gap: 16 — ABNORMAL HIGH (ref 5–15)
Anion gap: 10 (ref 5–15)
Anion gap: 8 (ref 5–15)
Anion gap: 9 (ref 5–15)
BUN: 51 mg/dL — ABNORMAL HIGH (ref 6–20)
BUN: 50 mg/dL — AB (ref 6–20)
BUN: 51 mg/dL — AB (ref 6–20)
BUN: 48 mg/dL — ABNORMAL HIGH (ref 6–20)
BUN: 44 mg/dL — ABNORMAL HIGH (ref 6–20)
CALCIUM: 9.3 mg/dL (ref 8.9–10.3)
CALCIUM: 9.4 mg/dL (ref 8.9–10.3)
CHLORIDE: 109 mmol/L (ref 101–111)
CHLORIDE: 108 mmol/L (ref 101–111)
CO2: 12 mmol/L — ABNORMAL LOW (ref 22–32)
CO2: 17 mmol/L — ABNORMAL LOW (ref 22–32)
CO2: 20 mmol/L — ABNORMAL LOW (ref 22–32)
CO2: 18 mmol/L — ABNORMAL LOW (ref 22–32)
CO2: 16 mmol/L — AB (ref 22–32)
CREATININE: 1.84 mg/dL — AB (ref 0.44–1.00)
CREATININE: 1.75 mg/dL — AB (ref 0.44–1.00)
CREATININE: 1.57 mg/dL — AB (ref 0.44–1.00)
Calcium: 8.8 mg/dL — ABNORMAL LOW (ref 8.9–10.3)
Calcium: 9.2 mg/dL (ref 8.9–10.3)
Calcium: 9.3 mg/dL (ref 8.9–10.3)
Chloride: 105 mmol/L (ref 101–111)
Chloride: 107 mmol/L (ref 101–111)
Chloride: 108 mmol/L (ref 101–111)
Creatinine, Ser: 2 mg/dL — ABNORMAL HIGH (ref 0.44–1.00)
Creatinine, Ser: 1.68 mg/dL — ABNORMAL HIGH (ref 0.44–1.00)
GFR calc Af Amer: 39 mL/min — ABNORMAL LOW (ref >60)
GFR calc Af Amer: 41 mL/min — ABNORMAL LOW (ref >60)
GFR calc non Af Amer: 35 mL/min — ABNORMAL LOW (ref >60)
GFR, EST AFRICAN AMERICAN: 33 mL/min — AB (ref >60)
GFR, EST AFRICAN AMERICAN: 36 mL/min — AB (ref >60)
GFR, EST AFRICAN AMERICAN: 44 mL/min — AB (ref >60)
GFR, EST NON AFRICAN AMERICAN: 28 mL/min — AB (ref >60)
GFR, EST NON AFRICAN AMERICAN: 31 mL/min — AB (ref >60)
GFR, EST NON AFRICAN AMERICAN: 33 mL/min — AB (ref >60)
GFR, EST NON AFRICAN AMERICAN: 38 mL/min — AB (ref >60)
GLUCOSE: 240 mg/dL — AB (ref 65–99)
Glucose, Bld: 449 mg/dL — ABNORMAL HIGH (ref 65–99)
Glucose, Bld: 212 mg/dL — ABNORMAL HIGH (ref 65–99)
Glucose, Bld: 119 mg/dL — ABNORMAL HIGH (ref 65–99)
Glucose, Bld: 174 mg/dL — ABNORMAL HIGH (ref 65–99)
POTASSIUM: 5 mmol/L (ref 3.5–5.1)
POTASSIUM: 5 mmol/L (ref 3.5–5.1)
POTASSIUM: 4.6 mmol/L (ref 3.5–5.1)
Potassium: 4.2 mmol/L (ref 3.5–5.1)
Potassium: 4.8 mmol/L (ref 3.5–5.1)
SODIUM: 133 mmol/L — AB (ref 135–145)
SODIUM: 134 mmol/L — AB (ref 135–145)
SODIUM: 136 mmol/L (ref 135–145)
SODIUM: 136 mmol/L (ref 135–145)
Sodium: 133 mmol/L — ABNORMAL LOW (ref 135–145)

## 2015-11-03 LAB — BLOOD GAS, VENOUS
ACID-BASE DEFICIT: 14.8 mmol/L — AB (ref 0.0–2.0)
BICARBONATE: 11.2 meq/L — AB (ref 20.0–24.0)
Drawn by: 365911
FIO2: 0.21
O2 SAT: 69.7 %
PATIENT TEMPERATURE: 97.4
TCO2: 10.6 mmol/L (ref 0–100)
pCO2, Ven: 26.5 mmHg — ABNORMAL LOW (ref 45.0–50.0)
pH, Ven: 7.245 — ABNORMAL LOW (ref 7.250–7.300)
pO2, Ven: 38 mmHg (ref 30.0–45.0)

## 2015-11-03 LAB — GLUCOSE, CAPILLARY
GLUCOSE-CAPILLARY: 101 mg/dL — AB (ref 65–99)
GLUCOSE-CAPILLARY: 118 mg/dL — AB (ref 65–99)
GLUCOSE-CAPILLARY: 125 mg/dL — AB (ref 65–99)
GLUCOSE-CAPILLARY: 138 mg/dL — AB (ref 65–99)
GLUCOSE-CAPILLARY: 150 mg/dL — AB (ref 65–99)
GLUCOSE-CAPILLARY: 163 mg/dL — AB (ref 65–99)
GLUCOSE-CAPILLARY: 171 mg/dL — AB (ref 65–99)
GLUCOSE-CAPILLARY: 209 mg/dL — AB (ref 65–99)
GLUCOSE-CAPILLARY: 235 mg/dL — AB (ref 65–99)
Glucose-Capillary: 107 mg/dL — ABNORMAL HIGH (ref 65–99)
Glucose-Capillary: 116 mg/dL — ABNORMAL HIGH (ref 65–99)
Glucose-Capillary: 130 mg/dL — ABNORMAL HIGH (ref 65–99)
Glucose-Capillary: 134 mg/dL — ABNORMAL HIGH (ref 65–99)
Glucose-Capillary: 151 mg/dL — ABNORMAL HIGH (ref 65–99)
Glucose-Capillary: 178 mg/dL — ABNORMAL HIGH (ref 65–99)
Glucose-Capillary: 217 mg/dL — ABNORMAL HIGH (ref 65–99)
Glucose-Capillary: 236 mg/dL — ABNORMAL HIGH (ref 65–99)
Glucose-Capillary: 236 mg/dL — ABNORMAL HIGH (ref 65–99)
Glucose-Capillary: 258 mg/dL — ABNORMAL HIGH (ref 65–99)
Glucose-Capillary: 331 mg/dL — ABNORMAL HIGH (ref 65–99)
Glucose-Capillary: 368 mg/dL — ABNORMAL HIGH (ref 65–99)
Glucose-Capillary: 399 mg/dL — ABNORMAL HIGH (ref 65–99)
Glucose-Capillary: 474 mg/dL — ABNORMAL HIGH (ref 65–99)

## 2015-11-03 LAB — PHOSPHORUS
Phosphorus: 4.7 mg/dL — ABNORMAL HIGH (ref 2.5–4.6)
Phosphorus: 2.9 mg/dL (ref 2.5–4.6)
Phosphorus: 2.5 mg/dL (ref 2.5–4.6)

## 2015-11-03 LAB — MRSA PCR SCREENING: MRSA by PCR: POSITIVE — AB

## 2015-11-03 LAB — MAGNESIUM: MAGNESIUM: 1.6 mg/dL — AB (ref 1.7–2.4)

## 2015-11-03 MED ORDER — DEXTROSE-NACL 5-0.45 % IV SOLN
INTRAVENOUS | Status: DC
Start: 2015-11-03 — End: 2015-11-03
  Administered 2015-11-03: 08:00:00 via INTRAVENOUS
  Administered 2015-11-03: 1000 mL via INTRAVENOUS
  Administered 2015-11-03: 06:00:00 via INTRAVENOUS

## 2015-11-03 MED ORDER — FLUTICASONE PROPIONATE 50 MCG/ACT NA SUSP
2.0000 | Freq: Every day | NASAL | Status: DC
  Administered 2015-11-03 – 2015-11-06 (×4): 2 via NASAL
  Filled 2015-11-03: qty 16

## 2015-11-03 MED ORDER — INSULIN ASPART 100 UNIT/ML ~~LOC~~ SOLN: 0.0000 [IU] | Freq: Three times a day (TID) | SUBCUTANEOUS | Status: DC

## 2015-11-03 MED ORDER — OXYMETAZOLINE HCL 0.05 % NA SOLN
1.0000 | Freq: Two times a day (BID) | NASAL | Status: DC
  Administered 2015-11-03 – 2015-11-06 (×6): 1 via NASAL
  Filled 2015-11-03: qty 15

## 2015-11-03 MED ORDER — INSULIN NPH (HUMAN) (ISOPHANE) 100 UNIT/ML ~~LOC~~ SUSP
10.0000 [IU] | Freq: Once | SUBCUTANEOUS | Status: AC
  Administered 2015-11-03: 10 [IU] via SUBCUTANEOUS
  Filled 2015-11-03: qty 10

## 2015-11-03 MED ORDER — SODIUM CHLORIDE 0.9 % IV SOLN
INTRAVENOUS | Status: DC
  Administered 2015-11-03: 17:00:00 via INTRAVENOUS

## 2015-11-03 MED ORDER — AMLODIPINE BESYLATE 5 MG PO TABS
5.0000 mg | ORAL_TABLET | Freq: Every day | ORAL | Status: DC
  Administered 2015-11-03 – 2015-11-04 (×2): 5 mg via ORAL
  Filled 2015-11-03 (×2): qty 1

## 2015-11-03 MED ORDER — CHLORHEXIDINE GLUCONATE CLOTH 2 % EX PADS
6.0000 | MEDICATED_PAD | Freq: Every day | CUTANEOUS | Status: DC
  Administered 2015-11-04 – 2015-11-05 (×2): 6 via TOPICAL

## 2015-11-03 MED ORDER — CARVEDILOL 12.5 MG PO TABS
50.0000 mg | ORAL_TABLET | Freq: Two times a day (BID) | ORAL | Status: DC
  Administered 2015-11-03 – 2015-11-06 (×6): 50 mg via ORAL
  Filled 2015-11-03 (×7): qty 4

## 2015-11-03 MED ORDER — MAGNESIUM SULFATE 2 GM/50ML IV SOLN
2.0000 g | Freq: Once | INTRAVENOUS | Status: AC
  Administered 2015-11-03: 2 g via INTRAVENOUS
  Filled 2015-11-03: qty 50

## 2015-11-03 MED ORDER — SODIUM CHLORIDE 0.9 % IV SOLN
INTRAVENOUS | Status: DC
  Administered 2015-11-03: 01:00:00 via INTRAVENOUS

## 2015-11-03 MED ORDER — LEVOTHYROXINE SODIUM 137 MCG PO TABS
137.0000 ug | ORAL_TABLET | Freq: Every day | ORAL | Status: DC
  Administered 2015-11-03 – 2015-11-06 (×4): 137 ug via ORAL
  Filled 2015-11-03 (×5): qty 1

## 2015-11-03 MED ORDER — ENOXAPARIN SODIUM 40 MG/0.4ML ~~LOC~~ SOLN
40.0000 mg | SUBCUTANEOUS | Status: DC
  Administered 2015-11-03 – 2015-11-06 (×4): 40 mg via SUBCUTANEOUS
  Filled 2015-11-03 (×4): qty 0.4

## 2015-11-03 MED ORDER — SODIUM CHLORIDE 0.9 % IV SOLN: INTRAVENOUS | Status: AC

## 2015-11-03 MED ORDER — MYCOPHENOLATE MOFETIL 250 MG PO CAPS
500.0000 mg | ORAL_CAPSULE | Freq: Two times a day (BID) | ORAL | Status: DC
  Administered 2015-11-03 – 2015-11-06 (×7): 500 mg via ORAL
  Filled 2015-11-03 (×9): qty 2

## 2015-11-03 MED ORDER — PREDNISONE 5 MG PO TABS
5.0000 mg | ORAL_TABLET | Freq: Every day | ORAL | Status: DC
  Administered 2015-11-03 – 2015-11-06 (×4): 5 mg via ORAL
  Filled 2015-11-03 (×4): qty 1

## 2015-11-03 MED ORDER — PANTOPRAZOLE SODIUM 40 MG IV SOLR
40.0000 mg | INTRAVENOUS | Status: DC
  Administered 2015-11-03 – 2015-11-04 (×2): 40 mg via INTRAVENOUS
  Filled 2015-11-03 (×2): qty 40

## 2015-11-03 MED ORDER — SODIUM CHLORIDE 0.9 % IV SOLN
INTRAVENOUS | Status: DC
  Filled 2015-11-03: qty 2.5

## 2015-11-03 MED ORDER — CHLORHEXIDINE GLUCONATE CLOTH 2 % EX PADS: 6.0000 | MEDICATED_PAD | Freq: Every day | CUTANEOUS | Status: DC

## 2015-11-03 MED ORDER — SODIUM CHLORIDE 0.9 % IV BOLUS (SEPSIS)
500.0000 mL | Freq: Once | INTRAVENOUS | Status: AC
  Administered 2015-11-03: 500 mL via INTRAVENOUS

## 2015-11-03 MED ORDER — TACROLIMUS 1 MG PO CAPS
1.0000 mg | ORAL_CAPSULE | Freq: Two times a day (BID) | ORAL | Status: DC
  Administered 2015-11-03 – 2015-11-06 (×7): 1 mg via ORAL
  Filled 2015-11-03 (×8): qty 1

## 2015-11-03 MED ORDER — MUPIROCIN 2 % EX OINT: 1.0000 "application " | TOPICAL_OINTMENT | Freq: Two times a day (BID) | CUTANEOUS | Status: DC

## 2015-11-03 MED ORDER — FENTANYL CITRATE (PF) 100 MCG/2ML IJ SOLN
25.0000 ug | Freq: Once | INTRAMUSCULAR | Status: AC
Start: 2015-11-03 — End: 2015-11-03
  Administered 2015-11-03: 25 ug via INTRAVENOUS
  Filled 2015-11-03: qty 2

## 2015-11-03 MED ORDER — INSULIN ASPART 100 UNIT/ML ~~LOC~~ SOLN: 0.0000 [IU] | Freq: Every day | SUBCUTANEOUS | Status: DC

## 2015-11-03 MED ORDER — MUPIROCIN 2 % EX OINT
1.0000 "application " | TOPICAL_OINTMENT | Freq: Two times a day (BID) | CUTANEOUS | Status: DC
  Administered 2015-11-03 – 2015-11-06 (×7): 1 via NASAL
  Filled 2015-11-03: qty 22

## 2015-11-03 MED ORDER — IBUPROFEN 200 MG PO TABS
400.0000 mg | ORAL_TABLET | ORAL | Status: DC | PRN
  Administered 2015-11-03 – 2015-11-05 (×7): 400 mg via ORAL
  Filled 2015-11-03 (×8): qty 2

## 2015-11-03 NOTE — Progress Notes (Signed)
RN notified MD of abnormal venous blood gas results and that the positive MRSA swab. MRSA order set put in. Will continue to monitor.

## 2015-11-03 NOTE — Progress Notes (Signed)
[facility] Progress Note   [name]  ZOX:[id number]  DOB: 11/30/1965  DOA: 11/02/2015 PCP: [name], MD  Brief narrative: [name] is a 50 y.o. female with insulin-dependent diabetes mellitus type 2, hypertension, hypothyroidism, renal transplant on chronic immunosuppressive, depression who presents to the hospital for vomiting  for the past 2-3 days and is found to have DKA.   Subjective: She states that nausea and vomiting have resolved. She has no abdominal pain. She never had any diarrhea. No cough or shortness of breath.  Assessment/Plan: Principal Problem:   DKA (diabetic ketoacidoses) -Transition off of insulin infusion once acidosis has resolved -Have discussed starting Levemir and Lantus along with NovoLog sliding scale rather than the 7030 that she is already taking at home-she is in agreement with this -A1c is pending  Active Problems: Vomiting -Resolved-start clear liquids (noncaloric until she is transitioned off of the insulin infusion) and advance diet as tolerated    Hypothyroidism -Continue Synthroid    Hyperkalemia -Resolved    Essential hypertension -Continue amlodipine and carvedilol    GERD (gastroesophageal reflux disease) -Continue ranitidine    CKD (chronic kidney disease) stage 2, GFR 60-89 ml/min   History of renal transplant - Continue immunosuppressives   Antibiotics: Anti-infectives    None     Code Status:     Code Status Orders        Start     Ordered   11/03/15 0033  Full code   Continuous     11/03/15 0032    Code Status History   Family Communication:  Disposition Plan: Home in 1-2 days DVT prophylaxis: Lovenox Consultants:  Procedures:     Objective: Filed Weights   11/02/15 2227  Weight: 97.523 kg (215 lb)    Intake/Output Summary (Last 24 hours) at 11/03/15 0802 Last data filed at 11/03/15 8119  Gross per 24 hour  Intake 1324.88 ml  Output      0 ml  Net 1324.88 ml      Vitals Filed Vitals:   11/03/15 0500 11/03/15 0600 11/03/15 0700 11/03/15 0741  BP: 120/48 112/38 112/45   Pulse: 86 86 87 86  Temp:    98.1 F (36.7 C)  TempSrc:    Oral  Resp: Height:      Weight:      SpO2: 94% 94% 96% 95%    Exam:  General:  Pt is alert, not in acute distress  HEENT: No icterus, No thrush, oral mucosa moist  Cardiovascular: regular rate and rhythm, S1/S2 No murmur  Respiratory: clear to auscultation bilaterally   Abdomen: Soft, +Bowel sounds, non tender, non distended, no guarding  MSK: No cyanosis or clubbing- no pedal edema   Data Reviewed: Basic Metabolic Panel:  Recent Labs Lab 11/02/15 2123 11/03/15 0224 11/03/15 0643  NA 127* 133* 134*  K 6.2* 5.0 5.0  CL 96* 105 107  CO2 8* 12* 17*  GLUCOSE 696* 449* 212*  BUN 47* 51* 50*  CREATININE 1.97* 2.00* 1.84*  CALCIUM 9.4 9.3 8.8*  MG  --  1.6*  --   PHOS  --  4.7* 2.9   Liver Function Tests:  Recent Labs Lab 11/02/15 2123  AST 15  ALT 15  ALKPHOS 122  BILITOT 1.8*  PROT 6.6  ALBUMIN 3.1*   No results for input(s): LIPASE, AMYLASE in the last 168 hours. No results for input(s): AMMONIA in the last 168 hours. CBC:  Recent Labs Lab  11/02/15 2123  WBC 7.9  NEUTROABS 7.0  HGB 13.1  HCT 41.9  MCV 93.3  PLT 194   Cardiac Enzymes: No results for input(s): CKTOTAL, CKMB, CKMBINDEX, TROPONINI in the last 168 hours. BNP (last 3 results) No results for input(s): BNP in the last 8760 hours.  ProBNP (last 3 results) No results for input(s): PROBNP in the last 8760 hours.  CBG:  Recent Labs Lab 11/03/15 0250 11/03/15 0355 11/03/15 0455 11/03/15 0600 11/03/15 0747  GLUCAP 368* 331* 258* 236* 163*    Recent Results (from the past 240 hour(s))  MRSA PCR Screening     Status: Abnormal   Collection Time: 11/03/15 12:26 AM  Result Value Ref Range Status   MRSA by PCR POSITIVE (A) NEGATIVE Final    Comment:        The GeneXpert MRSA Assay  (FDA approved for NASAL specimens only), is one component of a comprehensive MRSA colonization surveillance program. It is not intended to diagnose MRSA infection nor to guide or monitor treatment for MRSA infections. RESULT CALLED TO, READ BACK BY AND VERIFIED WITH: Q.IMBENAMA,RN AT 7673 ON 11/03/15 BY W.SHEA      Studies: Dg Chest 2 View  11/02/2015  CLINICAL DATA:  Evaluate for pneumonia.  Cough and sneezing. EXAM: CHEST  2 VIEW COMPARISON:  Chest radiograph 09/25/2015. FINDINGS: The heart size and mediastinal contours are within normal limits. Both lungs are clear. The visualized skeletal structures are unremarkable. IMPRESSION: No active cardiopulmonary disease. Electronically Signed   By: Annia Belt M.D.   On: 11/02/2015 21:04    Scheduled Meds:  Scheduled Meds: . Chlorhexidine Gluconate Cloth  6 each Topical Q0600  . insulin NPH Human  10 Units Subcutaneous Once  . mupirocin ointment  1 application Nasal BID  . pantoprazole (PROTONIX) IV  40 mg Intravenous Q24H   Continuous Infusions: . sodium chloride Stopped (11/03/15 0607)  . dextrose 5 % and 0.45% NaCl 125 mL/hr at 11/03/15 0606  . insulin (NOVOLIN-R) infusion 6.2 Units/hr (11/03/15 0759)    Time spent on care of this patient: 35 min   Lizanne Erker, MD 11/03/2015, 8:02 AM  LOS: 1 day   [facility] Office  [phone] Pager - Text Page per [email or web address] If 7PM-7AM, please contact night-coverage [email or web address]

## 2015-11-03 NOTE — H&P (Signed)
[facility] History and Physical  [name]:[id number] DOB: 1966-01-21 DOA: 11/02/2015  Referring physician: Harold Hedge, M.D. PCP: [name], MD   Chief Complaint: Vomiting and diarrhea.  HPI: [name] is a 50 y.o. female with a past medical history of insulin-dependent type 2 diabetes, hypertension, hypothyroidism, status post renal transplant on chronic immunosuppressants, arthritis, depression, chronic headache comes to the emergency department via EMS due to outflow chief complaint.  Per patient, she has been feeling sick for the past 2-3 days. She has had coughing and sneezing vomiting and diarrhea, has had very little appetite appetite and has only been having few sips of water. She states that she has been using her insulin as usual, but did not have this evening dose. She denies fever, chills, but feels fatigue. She denies chest pain, dyspnea, abdominal pain or GU symptoms.  Workup in the emergency department shows hyperglycemia, pseudohyponatremia, elevated anion gap acidosis consistent with DKA.    Review of Systems:  Constitutional:  Positive fatigue.  No weight loss, night sweats, Fevers, chills HEENT: Positive sneezing No headaches, Difficulty swallowing,Tooth/dental problems, Sore throat  No  itching, ear ache, nasal congestion, post nasal drip,  Cardio-vascular:  No chest pain, Orthopnea, PND, swelling in lower extremities, anasarca, dizziness, palpitations  GI:  Positive nausea, vomiting, diarrhea, loss of appetite  No heartburn, indigestion, abdominal pain, change in bowel habits Resp:  Positive nonproductive cough. Skin:  no rash or lesions.  GU:  no dysuria, change in color of urine, no urgency or frequency. No flank pain.  Musculoskeletal:  No joint pain or swelling. No decreased range of motion. No back pain.  Psych:  No change in mood or affect. No depression or anxiety. No memory loss.   Past Medical History    Diagnosis Date  . Hypertension   . Hypothyroid   . 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 Pneumonia ~ 2007?  [name] Type II diabetes mellitus (HCC)     Insulin dependant  . Daily headache   . Arthritis     "elbows, knees, legs, back" (09/24/2015)  . Depression     "years ago"  . Kidney disease   . End stage renal disease (HCC)     right arm AV graft, post transplant   Past Surgical History  Procedure Laterality Date  . Back surgery    . Tubal ligation  07/1999  . Dg av dialysis graft declot or    . Nephrectomy transplanted organ    . Kidney transplant Right December 16, 2009  . Toe amputation Right     1,2 & 3rd toes.  . Cataract extraction w/ intraocular lens  implant, bilateral Bilateral   . Amputation Left 05/11/2014    Procedure: AMPUTATION LEFT GREAT TOE;  Surgeon: Kathryne Hitch, MD;  Location: [facility];  Service: Orthopedics;  Laterality: Left;  . Incision and drainage Right 06/02/2015    Procedure: INCISION AND DRAINAGE OF RIGHT 3rd RAY RESECTION;  Surgeon: Kathryne Hitch, MD;  Location: [facility] OR;  Service: Orthopedics;  Laterality: Right;  . Lumbar disc surgery  2001  . Cesarean section  07/1999  . Av fistula placement Right     forearm   Social History:  reports that she quit smoking about 17 years ago. Her smoking use included Cigarettes. She has a 11 pack-year smoking history. She has never used smokeless tobacco. She reports that she does not drink alcohol or use illicit drugs.  Allergies  Allergen Reactions  . Ace Inhibitors Cough    Lisinopril  . Adhesive [Tape] Itching    Family History  Problem Relation Age of Onset  . Emphysema Mother   . Throat cancer Mother   . COPD Mother   . Cancer Mother   . Emphysema Father   . COPD Father   . Stroke Father   . ADD / ADHD Son     Prior to Admission medications   Medication Sig Start Date End Date Taking? Authorizing Provider  acetaminophen (TYLENOL) 500 MG tablet Take 500-1,000  mg by mouth every 6 (six) hours as needed for headache.   Yes Historical Provider, MD  amLODipine (NORVASC) 5 MG tablet Take 1 tablet (5 mg total) by mouth daily. 03/04/15  Yes Alison Murray, MD  carvedilol (COREG) 25 MG tablet Take 2 tablets (50 mg total) by mouth 2 (two) times daily. 03/04/15  Yes Alison Murray, MD  feeding supplement, GLUCERNA SHAKE, (GLUCERNA SHAKE) LIQD Take 237 mLs by mouth 3 (three) times daily between meals. 06/04/15  Yes Nishant Dhungel, MD  insulin lispro (HUMALOG) 100 UNIT/ML injection Sliding scale BG 70 - 120:  0 units.  BG 121 - 150:  1 unit.  BG 151 - 200:  2 units.  BG 201 - 250:  3 units. BG 251 - 300:  5 units. BG 301 - 350:  7 units 09/26/15  Yes Esperanza Sheets, MD  insulin NPH-regular Human (NOVOLIN 70/30) (70-30) 100 UNIT/ML injection Inject 15 Units into the skin 2 (two) times daily with a meal. 06/06/15  Yes Starleen Arms, MD  levothyroxine (SYNTHROID, LEVOTHROID) 137 MCG tablet Take 1 tablet (137 mcg total) by mouth daily. BRAND NAME ONLY Patient taking differently: Take 137 mcg by mouth every evening. BRAND NAME ONLY 03/04/15  Yes Alison Murray, MD  mycophenolate (CELLCEPT) 250 MG capsule Take 500 mg by mouth 2 (two) times daily.    Yes Historical Provider, MD  predniSONE (DELTASONE) 5 MG tablet Take 5 mg by mouth daily. 05/29/15  Yes Historical Provider, MD  ranitidine (ZANTAC) 150 MG capsule Take 150 mg by mouth daily as needed for heartburn.    Yes Historical Provider, MD  tacrolimus (PROGRAF) 1 MG capsule Take 1 mg by mouth 2 (two) times daily.    Yes Historical Provider, MD  mupirocin ointment (BACTROBAN) 2 % Place into the nose 2 (two) times daily. Patient not taking: Reported on 09/24/2015 06/04/15   Eddie North, MD   Physical Exam: Filed Vitals:   11/02/15 2227 11/02/15 2230 11/02/15 2300 11/03/15 0005  BP:  110/57 109/55 118/67  Pulse:  81  90  Temp:      TempSrc:      Resp:  19 17 20   Height: 5\' 8"  (1.727 m)     Weight: 97.523 kg (215 lb)      SpO2:  97%  100%    Wt Readings from Last 3 Encounters:  11/02/15 97.523 kg (215 lb)  09/26/15 101.152 kg (223 lb)  06/02/15 96.7 kg (213 lb 3 oz)    General:  Appears calm and comfortable. Appears older than chronological age. Eyes: PERRL, normal lids, irises & conjunctiva ENT: grossly normal hearing, lips & tongue. Oral mucosa is dry. Neck: no LAD, masses or thyromegaly Cardiovascular: RRR, no m/r/g. No LE edema. Telemetry: SR, no arrhythmias  Respiratory: CTA bilaterally, no w/r/r. Normal respiratory effort. Abdomen: soft, ntnd Skin: Hyperpigmentation and mild keratosis of lower 1/3 of pretibial  area on both extremities. Musculoskeletal: grossly normal tone BUE/BLE Psychiatric: grossly normal mood and affect, speech fluent and appropriate Neurologic: Awake, alert, oriented 3, grossly non-focal.          Labs on Admission:  Basic Metabolic Panel:  Recent Labs Lab 11/02/15 2123  NA 127*  K 6.2*  CL 96*  CO2 8*  GLUCOSE 696*  BUN 47*  CREATININE 1.97*  CALCIUM 9.4   Liver Function Tests:  Recent Labs Lab 11/02/15 2123  AST 15  ALT 15  ALKPHOS 122  BILITOT 1.8*  PROT 6.6  ALBUMIN 3.1*   CBC:  Recent Labs Lab 11/02/15 2123  WBC 7.9  NEUTROABS 7.0  HGB 13.1  HCT 41.9  MCV 93.3  PLT 194    CBG:  Recent Labs Lab 11/02/15 2015  GLUCAP >600*    Radiological Exams on Admission: Dg Chest 2 View  11/02/2015  CLINICAL DATA:  Evaluate for pneumonia.  Cough and sneezing. EXAM: CHEST  2 VIEW COMPARISON:  Chest radiograph 09/25/2015. FINDINGS: The heart size and mediastinal contours are within normal limits. Both lungs are clear. The visualized skeletal structures are unremarkable. IMPRESSION: No active cardiopulmonary disease. Electronically Signed   By: Annia Belt M.D.   On: 11/02/2015 21:04    EKG: Independently reviewed. Vent. rate 77 BPM PR interval 136 ms QRS duration 146 ms QT/QTc 474/536 ms P-R-T axes 75 149 88 Sinus rhythm Right  bundle branch block No changes when compared to previous tracings.  Assessment/Plan Principal Problem:   DKA (diabetic ketoacidoses) (HCC) Admit to a stepdown. Keep nothing by mouth for now Continue insulin infusion. Continue IV hydration. Continue CBG monitoring. Follow-up electrolytes closely.  Active Problems:    Hyperkalemia Secondary to dehydration and acidosis. Continue current measures and follow-up potassium level.    Hypothyroidism Resume levothyroxine in a.m. once the patient is tolerating oral intake.  Monitor TSH periodically.     Essential hypertension Resume antihypertensives in a.m. Monitor blood pressure.    GERD (gastroesophageal reflux disease) Protonix 40 mg IVP every 24 hours.    CKD (chronic kidney disease) stage 2, GFR 60-89 ml/min   History of renal transplant Continue IV hydration. Follow-up BUN/creatinine and electrolytes.    Code Status: Full code. DVT Prophylaxis: SCDs. Family Communication:  Disposition Plan: Admit for treatment of DKA.  Time spent:About 70 minutes were spent in the process of this admission.  Bobette Mo, M.D. [facility] Pager [phone].

## 2015-11-03 NOTE — Progress Notes (Signed)
Utilization review completed.  

## 2015-11-04 DIAGNOSIS — D696 Thrombocytopenia, unspecified: Secondary | ICD-10-CM

## 2015-11-04 DIAGNOSIS — E038 Other specified hypothyroidism: Secondary | ICD-10-CM

## 2015-11-04 LAB — GLUCOSE, CAPILLARY
GLUCOSE-CAPILLARY: 129 mg/dL — AB (ref 65–99)
GLUCOSE-CAPILLARY: 144 mg/dL — AB (ref 65–99)
GLUCOSE-CAPILLARY: 144 mg/dL — AB (ref 65–99)
GLUCOSE-CAPILLARY: 167 mg/dL — AB (ref 65–99)
GLUCOSE-CAPILLARY: 170 mg/dL — AB (ref 65–99)
GLUCOSE-CAPILLARY: 181 mg/dL — AB (ref 65–99)
GLUCOSE-CAPILLARY: 181 mg/dL — AB (ref 65–99)
GLUCOSE-CAPILLARY: 252 mg/dL — AB (ref 65–99)
GLUCOSE-CAPILLARY: 88 mg/dL (ref 65–99)
Glucose-Capillary: 170 mg/dL — ABNORMAL HIGH (ref 65–99)
Glucose-Capillary: 160 mg/dL — ABNORMAL HIGH (ref 65–99)
Glucose-Capillary: 183 mg/dL — ABNORMAL HIGH (ref 65–99)
Glucose-Capillary: 171 mg/dL — ABNORMAL HIGH (ref 65–99)
Glucose-Capillary: 195 mg/dL — ABNORMAL HIGH (ref 65–99)
Glucose-Capillary: 163 mg/dL — ABNORMAL HIGH (ref 65–99)
Glucose-Capillary: 168 mg/dL — ABNORMAL HIGH (ref 65–99)
Glucose-Capillary: 323 mg/dL — ABNORMAL HIGH (ref 65–99)
Glucose-Capillary: 231 mg/dL — ABNORMAL HIGH (ref 65–99)

## 2015-11-04 LAB — CBC
HEMATOCRIT: 38.1 % (ref 36.0–46.0)
HEMOGLOBIN: 12 g/dL (ref 12.0–15.0)
MCH: 27.9 pg (ref 26.0–34.0)
MCHC: 31.5 g/dL (ref 30.0–36.0)
MCV: 88.6 fL (ref 78.0–100.0)
Platelets: 149 10*3/uL — ABNORMAL LOW (ref 150–400)
RBC: 4.3 MIL/uL (ref 3.87–5.11)
RDW: 13.6 % (ref 11.5–15.5)
WBC: 4.6 10*3/uL (ref 4.0–10.5)

## 2015-11-04 LAB — BASIC METABOLIC PANEL
Anion gap: 7 (ref 5–15)
Anion gap: 7 (ref 5–15)
BUN: 43 mg/dL — AB (ref 6–20)
BUN: 39 mg/dL — AB (ref 6–20)
CHLORIDE: 113 mmol/L — AB (ref 101–111)
CHLORIDE: 112 mmol/L — AB (ref 101–111)
CO2: 19 mmol/L — ABNORMAL LOW (ref 22–32)
CO2: 19 mmol/L — ABNORMAL LOW (ref 22–32)
Calcium: 9.6 mg/dL (ref 8.9–10.3)
Calcium: 9.6 mg/dL (ref 8.9–10.3)
Creatinine, Ser: 1.49 mg/dL — ABNORMAL HIGH (ref 0.44–1.00)
Creatinine, Ser: 1.56 mg/dL — ABNORMAL HIGH (ref 0.44–1.00)
GFR calc Af Amer: 44 mL/min — ABNORMAL LOW (ref >60)
GFR calc Af Amer: 47 mL/min — ABNORMAL LOW (ref >60)
GFR calc non Af Amer: 38 mL/min — ABNORMAL LOW (ref >60)
GFR calc non Af Amer: 40 mL/min — ABNORMAL LOW (ref >60)
GLUCOSE: 98 mg/dL (ref 65–99)
Glucose, Bld: 202 mg/dL — ABNORMAL HIGH (ref 65–99)
POTASSIUM: 4.2 mmol/L (ref 3.5–5.1)
POTASSIUM: 4.3 mmol/L (ref 3.5–5.1)
SODIUM: 139 mmol/L (ref 135–145)
SODIUM: 138 mmol/L (ref 135–145)

## 2015-11-04 LAB — MAGNESIUM: Magnesium: 2.1 mg/dL (ref 1.7–2.4)

## 2015-11-04 LAB — HEMOGLOBIN A1C
Hgb A1c MFr Bld: 11.7 % — ABNORMAL HIGH (ref 4.8–5.6)
MEAN PLASMA GLUCOSE: 289 mg/dL

## 2015-11-04 MED ORDER — HYDRALAZINE HCL 20 MG/ML IJ SOLN
10.0000 mg | INTRAMUSCULAR | Status: AC
  Administered 2015-11-04: 10 mg via INTRAVENOUS
  Filled 2015-11-04: qty 1

## 2015-11-04 MED ORDER — INSULIN ASPART 100 UNIT/ML ~~LOC~~ SOLN
0.0000 [IU] | Freq: Every day | SUBCUTANEOUS | Status: DC
  Administered 2015-11-05: 2 [IU] via SUBCUTANEOUS

## 2015-11-04 MED ORDER — INSULIN ASPART 100 UNIT/ML ~~LOC~~ SOLN
0.0000 [IU] | Freq: Three times a day (TID) | SUBCUTANEOUS | Status: DC
  Administered 2015-11-05: 8 [IU] via SUBCUTANEOUS
  Administered 2015-11-05: 11 [IU] via SUBCUTANEOUS
  Administered 2015-11-06: 15 [IU] via SUBCUTANEOUS

## 2015-11-04 MED ORDER — GLUCERNA SHAKE PO LIQD
237.0000 mL | ORAL | Status: DC
  Administered 2015-11-06: 237 mL via ORAL
  Filled 2015-11-04 (×2): qty 237

## 2015-11-04 MED ORDER — PANTOPRAZOLE SODIUM 40 MG PO TBEC
40.0000 mg | DELAYED_RELEASE_TABLET | Freq: Every day | ORAL | Status: DC
  Administered 2015-11-04 – 2015-11-06 (×3): 40 mg via ORAL
  Filled 2015-11-04 (×3): qty 1

## 2015-11-04 MED ORDER — PRO-STAT SUGAR FREE PO LIQD
30.0000 mL | Freq: Two times a day (BID) | ORAL | Status: DC
  Administered 2015-11-04: 30 mL via ORAL
  Filled 2015-11-04 (×2): qty 30

## 2015-11-04 MED ORDER — DEXTROSE-NACL 5-0.45 % IV SOLN
INTRAVENOUS | Status: DC
  Administered 2015-11-04: 02:00:00 via INTRAVENOUS

## 2015-11-04 MED ORDER — INSULIN GLARGINE 100 UNIT/ML ~~LOC~~ SOLN
15.0000 [IU] | Freq: Every day | SUBCUTANEOUS | Status: DC
  Administered 2015-11-04: 15 [IU] via SUBCUTANEOUS
  Filled 2015-11-04: qty 0.15

## 2015-11-04 MED ORDER — HYDRALAZINE HCL 20 MG/ML IJ SOLN
10.0000 mg | Freq: Four times a day (QID) | INTRAMUSCULAR | Status: DC | PRN
  Administered 2015-11-04: 10 mg via INTRAVENOUS
  Filled 2015-11-04: qty 1

## 2015-11-04 MED ORDER — INSULIN ASPART PROT & ASPART (70-30 MIX) 100 UNIT/ML ~~LOC~~ SUSP
15.0000 [IU] | Freq: Two times a day (BID) | SUBCUTANEOUS | Status: DC
  Administered 2015-11-04: 15 [IU] via SUBCUTANEOUS
  Filled 2015-11-04: qty 10

## 2015-11-04 MED ORDER — HYDRALAZINE HCL 20 MG/ML IJ SOLN
10.0000 mg | INTRAMUSCULAR | Status: DC | PRN
  Administered 2015-11-05: 10 mg via INTRAVENOUS
  Filled 2015-11-04: qty 1

## 2015-11-04 NOTE — Progress Notes (Addendum)
Inpatient Diabetes Program Recommendations  AACE/ADA: New Consensus Statement on Inpatient Glycemic Control (2015)  Target Ranges:  Prepandial:   less than 140 mg/dL      Peak postprandial:   less than 180 mg/dL (1-2 hours)      Critically ill patients:  140 - 180 mg/dL   Results for [name], [name] (MRN [id number]) as of 11/04/2015 10:08  Ref. Range 09/25/2015 05:44  Hemoglobin A1C Latest Ref Range: 4.8-5.6 % 11.6 (H)   Results for [name], [name] (MRN [id number]) as of 11/04/2015 10:08  Ref. Range 11/02/2015 21:23  Sodium Latest Ref Range: 135-145 mmol/L 127 (L)  Potassium Latest Ref Range: 3.5-5.1 mmol/L 6.2 (HH)  Chloride Latest Ref Range: 101-111 mmol/L 96 (L)  CO2 Latest Ref Range: 22-32 mmol/L 8 (L)  BUN Latest Ref Range: 6-20 mg/dL 47 (H)  Creatinine Latest Ref Range: 0.44-1.00 mg/dL 1.97 (H)  Calcium Latest Ref Range: 8.9-10.3 mg/dL 9.4  EGFR (Non-African Amer.) Latest Ref Range: >60 mL/min 29 (L)  EGFR (African American) Latest Ref Range: >60 mL/min 33 (L)  Glucose Latest Ref Range: 65-99 mg/dL 696 (HH)  Anion gap Latest Ref Range: 5-15  23 (H)    Admit with: DKA  History: DM, HTN, Renal Transplant  Home DM Meds: Novolin 70/30 insulin- 15 units bidwc         Humalog 1-7 units tid per SSI  Current Insulin Orders: IV Insulin drip per DKA order set     -Note CO2 up to 19 and Anion Gap down to 7 on BMET from 4am today.  -Patient ate 55 grams carbohydrates this AM with breakfast.  -Likely patient will be ready to transition back to SQ insulin soon.  -Note DM Coordinator had discussion about the importance of good glucose control with patient during hospitalization back in January.    MD- When patient ready to transition to SQ insulin regimen, please consider the following:   1. Start Levemir 17 units daily (this would be approximately 80% of the total dose of basal insulin patient receives in her total dose of 70/30 insulin per day). Make sure to give 1st dose  Levemir at least 1 hour before IV insulin drip stopped.  2. Start Novolog Sensitive Correction Scale/ SSI (0-9 units) TID AC + HS  3. Start Novolog 3 units tidwc (low dose meal coverage as well)  4. Can convert patient back to her home 70/30 insulin once closer to discharge      --Will follow patient during hospitalization--  Wyn Quaker RN, MSN, CDE Diabetes Coordinator Inpatient Glycemic Control Team Team Pager: [phone] (8a-5p)

## 2015-11-04 NOTE — Progress Notes (Signed)
The patient is receiving Protonix by the intravenous route.  Based on criteria approved by the Pharmacy and Therapeutics Committee and the Medical Executive Committee, the medication is being converted to the equivalent oral dose form.  These criteria include: -No Active GI bleeding -Able to tolerate diet of full liquids (or better) or tube feeding OR able to tolerate other medications by the oral or enteral route  If you have any questions about this conversion, please contact the Pharmacy Department (ext [phone]).  Thank you.  Elson Clan, [facility] 11/04/2015 8:25 AM

## 2015-11-04 NOTE — Progress Notes (Signed)
Initial Nutrition Assessment  DOCUMENTATION CODES:   Obesity unspecified  INTERVENTION:   -Provide Glucerna Shake po daily, each supplement provides 220 kcal and 10 grams of protein -Provide Prostat BID -RD to continue to monitor for needs  NUTRITION DIAGNOSIS:   Increased nutrient needs related to wound healing as evidenced by estimated needs.  GOAL:   Patient will meet greater than or equal to 90% of their needs  MONITOR:   PO intake, Supplement acceptance, Labs, Weight trends, Skin, I & O's  REASON FOR ASSESSMENT:   Malnutrition Screening Tool    ASSESSMENT:   50 y.o. female with insulin-dependent diabetes mellitus type 2, hypertension, hypothyroidism, renal transplant on chronic immunosuppressive, depression who presents to the hospital for vomiting for the past 2-3 days and is found to have DKA.  Patient in room c/o headache. Pt states she ate a cheeseburger and french fries for lunch, she denies nausea after eating this. Pt had N/V x 3 days PTA. Pt tolerating diet at this time. Pt with DM toe ulcer. RD to order Prostat and one Glucerna shake daily. Pt reports eating normally PTA and has had no appetite changes. Pt may benefit from diet education once out of the ICU setting.  Weight has remained stable. Pt with no muscle or fat depletion.  Medications:   Labs reviewed: CBGs: 129-163 Mg WNL HgbA1c: 11.7  Diet Order:  Diet Carb Modified Fluid consistency:: Thin; Room service appropriate?: Yes  Skin:  Wound (see comment) (DM toe ulcer)  Last BM:  PTA  Height:   Ht Readings from Last 1 Encounters:  11/02/15  (1.727 m)    Weight:   Wt Readings from Last 1 Encounters:  11/02/15 215 lb (97.523 kg)    Ideal Body Weight:  63.6 kg  BMI:  Body mass index is 32.7 kg/(m^2).  Estimated Nutritional Needs:   Kcal:  1800-2000  Protein:  80-90g  Fluid:  2L/day  EDUCATION NEEDS:   Education needs no appropriate at this time (Education may be  appropriate once out of ICU setting.)  Tilda Franco, MS, RD, LDN Pager: [phone] After Hours Pager: [phone]

## 2015-11-04 NOTE — Progress Notes (Signed)
[facility] Progress Note   [name]  DGU:[id number]  DOB: 11/04/1965  DOA: 11/02/2015 PCP: [name] Cords, MD  Brief narrative: [name] is a 50 y.o. female with insulin-dependent diabetes mellitus type 2, hypertension, hypothyroidism, renal transplant on chronic immunosuppressive, depression who presents to the hospital for vomiting  for the past 2-3 days and is found to have DKA.   Subjective: She states that nausea and vomiting have resolved. She has no abdominal pain. She never had any diarrhea. No cough or shortness of breath.  Assessment/Plan: Principal Problem:   DKA (diabetic ketoacidoses) -Transition off of insulin infusion once acidosis has resolved- still acidotic at this point-  -Have discussed starting Levemir and Lantus along with NovoLog sliding scale rather than the 70/30 that she is already taking at home-she is in agreement with this -A1c is 11.7  Active Problems: Vomiting -Resolved-   Acute thrombocytopenia - follow - reason uncertain- cont Lovenox for DVT prophylaxis for now    Hypothyroidism -Continue Synthroid    Hyperkalemia -Resolved    Essential hypertension -Continue amlodipine and carvedilol    GERD (gastroesophageal reflux disease) -Continue ranitidine    CKD (chronic kidney disease) stage 2, GFR 60-89 ml/min   History of renal transplant - Continue immunosuppressives   Antibiotics: Anti-infectives    None     Code Status:     Code Status Orders        Start     Ordered   11/03/15 0033  Full code   Continuous     11/03/15 0032    Code Status History   Family Communication:  Disposition Plan: Home in 1-2 days DVT prophylaxis: Lovenox Consultants:  Procedures:     Objective: Filed Weights   11/02/15 2227  Weight: 97.523 kg (215 lb)    Intake/Output Summary (Last 24 hours) at 11/04/15 1200 Last data filed at 11/04/15 0800  Gross per 24 hour  Intake 2454.28 ml  Output      0 ml  Net  2454.28 ml     Vitals Filed Vitals:   11/04/15 0600 11/04/15 0800 11/04/15 0900 11/04/15 1000  BP: 170/75 172/69  160/75  Pulse: 74 73 73 76  Temp:  97.6 F (36.4 C)    TempSrc:  Oral    Resp: Height:      Weight:      SpO2: 91% 94% 95% 95%    Exam:  General:  Pt is alert, not in acute distress  HEENT: No icterus, No thrush, oral mucosa moist  Cardiovascular: regular rate and rhythm, S1/S2 No murmur  Respiratory: clear to auscultation bilaterally   Abdomen: Soft, +Bowel sounds, non tender, non distended, no guarding  MSK: No cyanosis or clubbing- no pedal edema   Data Reviewed: Basic Metabolic Panel:  Recent Labs Lab 11/03/15 0224 11/03/15 0643 11/03/15 1106 11/03/15 1501 11/03/15 1905 11/03/15 2336 11/04/15 0345  NA 133* 134* 136 136 133* 139 138  K 5.0 5.0 4.2 4.8 4.6 4.2 4.3  CL 105 107 108 109 108 113* 112*  CO2 12* 17* 20* 18* 16* 19* 19*  GLUCOSE 449* 212* 119* 174* 240* 98 202*  BUN 51* 50* 51* 48* 44* 43* 39*  CREATININE 2.00* 1.84* 1.75* 1.57* 1.68* 1.56* 1.49*  CALCIUM 9.3 8.8* 9.2 9.4 9.3 9.6 9.6  MG 1.6*  --   --   --   --   --  2.1  PHOS 4.7* 2.9 2.5  --   --   --   --  Liver Function Tests:  Recent Labs Lab 11/02/15 2123  AST 15  ALT 15  ALKPHOS 122  BILITOT 1.8*  PROT 6.6  ALBUMIN 3.1*   No results for input(s): LIPASE, AMYLASE in the last 168 hours. No results for input(s): AMMONIA in the last 168 hours. CBC:  Recent Labs Lab 11/02/15 2123 11/04/15 0345  WBC 7.9 4.6  NEUTROABS 7.0  --   HGB 13.1 12.0  HCT 41.9 38.1  MCV 93.3 88.6  PLT 194 149*   Cardiac Enzymes: No results for input(s): CKTOTAL, CKMB, CKMBINDEX, TROPONINI in the last 168 hours. BNP (last 3 results) No results for input(s): BNP in the last 8760 hours.  ProBNP (last 3 results) No results for input(s): PROBNP in the last 8760 hours.  CBG:  Recent Labs Lab 11/04/15 0351 11/04/15 0450 11/04/15 0544 11/04/15 0649 11/04/15 0752   GLUCAP 160* 183* 252* 170* 171*    Recent Results (from the past 240 hour(s))  MRSA PCR Screening     Status: Abnormal   Collection Time: 11/03/15 12:26 AM  Result Value Ref Range Status   MRSA by PCR POSITIVE (A) NEGATIVE Final    Comment:        The GeneXpert MRSA Assay (FDA approved for NASAL specimens only), is one component of a comprehensive MRSA colonization surveillance program. It is not intended to diagnose MRSA infection nor to guide or monitor treatment for MRSA infections. RESULT CALLED TO, READ BACK BY AND VERIFIED WITH: Q.IMBENAMA,RN AT 1610 ON 11/03/15 BY W.SHEA      Studies: Dg Chest 2 View  11/02/2015  CLINICAL DATA:  Evaluate for pneumonia.  Cough and sneezing. EXAM: CHEST  2 VIEW COMPARISON:  Chest radiograph 09/25/2015. FINDINGS: The heart size and mediastinal contours are within normal limits. Both lungs are clear. The visualized skeletal structures are unremarkable. IMPRESSION: No active cardiopulmonary disease. Electronically Signed   By: Annia Belt M.D.   On: 11/02/2015 21:04    Scheduled Meds:  Scheduled Meds: . amLODipine  5 mg Oral Daily  . carvedilol  50 mg Oral BID  . Chlorhexidine Gluconate Cloth  6 each Topical Q0600  . enoxaparin (LOVENOX) injection  40 mg Subcutaneous Q24H  . fluticasone  2 spray Each Nare Daily  . levothyroxine  137 mcg Oral Daily  . mupirocin ointment  1 application Nasal BID  . mycophenolate  500 mg Oral BID  . oxymetazoline  1 spray Each Nare BID  . pantoprazole  40 mg Oral Daily  . predniSONE  5 mg Oral Daily  . tacrolimus  1 mg Oral BID   Continuous Infusions: . dextrose 5 % and 0.45% NaCl 125 mL/hr at 11/04/15 0152  . insulin (NOVOLIN-R) infusion 3.9 Units/hr (11/04/15 1000)    Time spent on care of this patient: 35 min   [name] Zeimet, MD 11/04/2015, 12:00 PM  LOS: 2 days   [facility] Office  [phone] Pager - Text Page per [email or web address] If 7PM-7AM, please contact night-coverage  [email or web address]

## 2015-11-04 NOTE — Progress Notes (Signed)
L Harduk is notified about BMeT results. Order received to continue the insulin drip. Continue to monitor the patient.

## 2015-11-05 DIAGNOSIS — E0811 Diabetes mellitus due to underlying condition with ketoacidosis with coma: Secondary | ICD-10-CM

## 2015-11-05 LAB — BASIC METABOLIC PANEL
Anion gap: 7 (ref 5–15)
Anion gap: 11 (ref 5–15)
BUN: 20 mg/dL (ref 6–20)
BUN: 24 mg/dL — ABNORMAL HIGH (ref 6–20)
CALCIUM: 9.7 mg/dL (ref 8.9–10.3)
CHLORIDE: 112 mmol/L — AB (ref 101–111)
CO2: 21 mmol/L — AB (ref 22–32)
CO2: 20 mmol/L — ABNORMAL LOW (ref 22–32)
CREATININE: 1.21 mg/dL — AB (ref 0.44–1.00)
Calcium: 9.9 mg/dL (ref 8.9–10.3)
Chloride: 105 mmol/L (ref 101–111)
Creatinine, Ser: 1.36 mg/dL — ABNORMAL HIGH (ref 0.44–1.00)
GFR calc Af Amer: >60 mL/min (ref >60)
GFR calc non Af Amer: 52 mL/min — ABNORMAL LOW (ref >60)
GFR, EST AFRICAN AMERICAN: 52 mL/min — AB (ref >60)
GFR, EST NON AFRICAN AMERICAN: 45 mL/min — AB (ref >60)
GLUCOSE: 373 mg/dL — AB (ref 65–99)
Glucose, Bld: 232 mg/dL — ABNORMAL HIGH (ref 65–99)
Potassium: 4.1 mmol/L (ref 3.5–5.1)
Potassium: 4.2 mmol/L (ref 3.5–5.1)
Sodium: 140 mmol/L (ref 135–145)
Sodium: 136 mmol/L (ref 135–145)

## 2015-11-05 LAB — GLUCOSE, CAPILLARY
Glucose-Capillary: 212 mg/dL — ABNORMAL HIGH (ref 65–99)
Glucose-Capillary: 235 mg/dL — ABNORMAL HIGH (ref 65–99)
Glucose-Capillary: 332 mg/dL — ABNORMAL HIGH (ref 65–99)

## 2015-11-05 LAB — CBC
HCT: 39 % (ref 36.0–46.0)
Hemoglobin: 12.5 g/dL (ref 12.0–15.0)
MCH: 28.3 pg (ref 26.0–34.0)
MCHC: 32.1 g/dL (ref 30.0–36.0)
MCV: 88.2 fL (ref 78.0–100.0)
PLATELETS: 143 10*3/uL — AB (ref 150–400)
RBC: 4.42 MIL/uL (ref 3.87–5.11)
RDW: 13.5 % (ref 11.5–15.5)
WBC: 3.5 10*3/uL — ABNORMAL LOW (ref 4.0–10.5)

## 2015-11-05 MED ORDER — INSULIN ASPART 100 UNIT/ML ~~LOC~~ SOLN
15.0000 [IU] | Freq: Once | SUBCUTANEOUS | Status: AC
  Administered 2015-11-05: 15 [IU] via SUBCUTANEOUS

## 2015-11-05 MED ORDER — INSULIN GLARGINE 100 UNIT/ML ~~LOC~~ SOLN
25.0000 [IU] | Freq: Every day | SUBCUTANEOUS | Status: DC
  Administered 2015-11-05: 25 [IU] via SUBCUTANEOUS
  Filled 2015-11-05: qty 0.25

## 2015-11-05 MED ORDER — AMLODIPINE BESYLATE 10 MG PO TABS
10.0000 mg | ORAL_TABLET | Freq: Every day | ORAL | Status: DC
  Administered 2015-11-05 – 2015-11-06 (×2): 10 mg via ORAL
  Filled 2015-11-05 (×2): qty 1

## 2015-11-05 MED ORDER — INSULIN GLARGINE 100 UNIT/ML ~~LOC~~ SOLN: 16.0000 [IU] | Freq: Every day | SUBCUTANEOUS | Status: DC

## 2015-11-05 NOTE — Discharge Instructions (Signed)
Appointment with [facility] on Friday November 08 2015 at 11 am.

## 2015-11-05 NOTE — Progress Notes (Addendum)
[facility] Progress Note   [name]  ZOX:[id number]  DOB: 1966/08/17  DOA: 11/02/2015 PCP: No primary care provider on file.  Brief narrative: [name] is a 50 y.o. female with insulin-dependent diabetes mellitus type 2, hypertension, hypothyroidism, renal transplant on chronic immunosuppressive, depression who presents to the hospital for vomiting  for the past 2-3 days and is found to have DKA.   Subjective: Transitioned off on insulin infusion last night- sugars still quite elevated.   Assessment/Plan: Principal Problem:   DKA (diabetic ketoacidoses) -Transition off of insulin infusion once acidosis has resolved- still mildly acidotic but no anion gap- cont to follow in SDU as sugars still uncontrolled and high risk for recurrence of DKA -Have discussed starting Levemir and Lantus along with NovoLog sliding scale rather than the 70/30 that she is already taking at home-she is in agreement with this but case management has not gotten back to me about cost of these meds- cont Lantus for now - will increase to 25 U QHS- she has used long acting insulin and Novolog in the past -A1c is 11.7  Active Problems: Vomiting -Resolved-   Acute thrombocytopenia -mild-  follow - reason uncertain- cont Lovenox for DVT prophylaxis for now    Hypothyroidism -Continue Synthroid    Hyperkalemia -Resolved    Essential hypertension -Continue amlodipine and carvedilol    GERD (gastroesophageal reflux disease) -Continue ranitidine    CKD (chronic kidney disease) stage 2- 3, GFR 60-89 ml/min   History of renal transplant - Continue immunosuppressives   Antibiotics: Anti-infectives    None     Code Status:     Code Status Orders        Start     Ordered   11/03/15 0033  Full code   Continuous     11/03/15 0032    Code Status History   Family Communication:  Disposition Plan: Home in 1-2 days DVT prophylaxis: Lovenox Consultants:  Procedures:      Objective: Filed Weights   11/02/15 2227 11/05/15 0400  Weight: 97.523 kg (215 lb) 100 kg (220 lb 7.4 oz)    Intake/Output Summary (Last 24 hours) at 11/05/15 1311 Last data filed at 11/05/15 0000  Gross per 24 hour  Intake  733.3 ml  Output      0 ml  Net  733.3 ml     Vitals Filed Vitals:   11/05/15 0900 11/05/15 1000 11/05/15 1100 11/05/15 1200  BP:   203/86   Pulse: 74 73 73   Temp:    97.5 F (36.4 C)  TempSrc:      Resp: Height:      Weight:      SpO2: 95% 95% 96%     Exam:  General:  Pt is alert, not in acute distress  HEENT: No icterus, No thrush, oral mucosa moist  Cardiovascular: regular rate and rhythm, S1/S2 No murmur  Respiratory: clear to auscultation bilaterally   Abdomen: Soft, +Bowel sounds, non tender, non distended, no guarding  MSK: No cyanosis or clubbing- no pedal edema   Data Reviewed: Basic Metabolic Panel:  Recent Labs Lab 11/03/15 0224 11/03/15 0643 11/03/15 1106 11/03/15 1501 11/03/15 1905 11/03/15 2336 11/04/15 0345 11/05/15 0311  NA 133* 134* 136 136 133* 139 138 140  K 5.0 5.0 4.2 4.8 4.6 4.2 4.3 4.1  CL 105 107 108 109 108 113* 112* 112*  CO2 12* 17* 20* 18* 16* 19* 19* 21*  GLUCOSE 449*  212* 119* 174* 240* 98 202* 232*  BUN 51* 50* 51* 48* 44* 43* 39* 20  CREATININE 2.00* 1.84* 1.75* 1.57* 1.68* 1.56* 1.49* 1.21*  CALCIUM 9.3 8.8* 9.2 9.4 9.3 9.6 9.6 9.9  MG 1.6*  --   --   --   --   --  2.1  --   PHOS 4.7* 2.9 2.5  --   --   --   --   --    Liver Function Tests:  Recent Labs Lab 11/02/15 2123  AST 15  ALT 15  ALKPHOS 122  BILITOT 1.8*  PROT 6.6  ALBUMIN 3.1*   No results for input(s): LIPASE, AMYLASE in the last 168 hours. No results for input(s): AMMONIA in the last 168 hours. CBC:  Recent Labs Lab 11/02/15 2123 11/04/15 0345 11/05/15 0311  WBC 7.9 4.6 3.5*  NEUTROABS 7.0  --   --   HGB 13.1 12.0 12.5  HCT 41.9 38.1 39.0  MCV 93.3 88.6 88.2  PLT 194 149* 143*   Cardiac  Enzymes: No results for input(s): CKTOTAL, CKMB, CKMBINDEX, TROPONINI in the last 168 hours. BNP (last 3 results) No results for input(s): BNP in the last 8760 hours.  ProBNP (last 3 results) No results for input(s): PROBNP in the last 8760 hours.  CBG:  Recent Labs Lab 11/04/15 1650 11/04/15 1804 11/04/15 2143 11/05/15 0027 11/05/15 0806  GLUCAP 231* 181* 181* 212* 332*    Recent Results (from the past 240 hour(s))  MRSA PCR Screening     Status: Abnormal   Collection Time: 11/03/15 12:26 AM  Result Value Ref Range Status   MRSA by PCR POSITIVE (A) NEGATIVE Final    Comment:        The GeneXpert MRSA Assay (FDA approved for NASAL specimens only), is one component of a comprehensive MRSA colonization surveillance program. It is not intended to diagnose MRSA infection nor to guide or monitor treatment for MRSA infections. RESULT CALLED TO, READ BACK BY AND VERIFIED WITH: Q.IMBENAMA,RN AT 1610 ON 11/03/15 BY W.SHEA      Studies: No results found.  Scheduled Meds:  Scheduled Meds: . amLODipine  10 mg Oral Daily  . carvedilol  50 mg Oral BID  . Chlorhexidine Gluconate Cloth  6 each Topical Q0600  . enoxaparin (LOVENOX) injection  40 mg Subcutaneous Q24H  . feeding supplement (GLUCERNA SHAKE)  237 mL Oral Q24H  . feeding supplement (PRO-STAT SUGAR FREE 64)  30 mL Oral BID  . fluticasone  2 spray Each Nare Daily  . insulin aspart  0-15 Units Subcutaneous TID WC  . insulin aspart  0-5 Units Subcutaneous QHS  . insulin aspart  15 Units Subcutaneous Once  . insulin glargine  16 Units Subcutaneous QHS  . levothyroxine  137 mcg Oral Daily  . mupirocin ointment  1 application Nasal BID  . mycophenolate  500 mg Oral BID  . oxymetazoline  1 spray Each Nare BID  . pantoprazole  40 mg Oral Daily  . predniSONE  5 mg Oral Daily  . tacrolimus  1 mg Oral BID   Continuous Infusions:    Time spent on care of this patient: 35 min   Nishka Heide, MD 11/05/2015, 1:11 PM   LOS: 3 days   [facility] Office  [phone] Pager - Text Page per [email or web address] If 7PM-7AM, please contact night-coverage [email or web address]

## 2015-11-05 NOTE — Progress Notes (Signed)
Patient as a follow appt on [phone] at [facility] at 11am/Rhonda Davis,BSN,RN,CCM

## 2015-11-05 NOTE — Progress Notes (Signed)
Per patient MD, patient will not be discharged home today due to CBGs remaining high. 1200 CBG was 434, contacted MD and received order for insulin.

## 2015-11-05 NOTE — Progress Notes (Signed)
Inpatient Diabetes Program Recommendations  AACE/ADA: New Consensus Statement on Inpatient Glycemic Control (2015)  Target Ranges:  Prepandial:   less than 140 mg/dL      Peak postprandial:   less than 180 mg/dL (1-2 hours)      Critically ill patients:  140 - 180 mg/dL   Results for [name], [name] (MRN [id number]) as of 11/05/2015 09:05  Ref. Range 11/05/2015 00:27 11/05/2015 08:06  Glucose-Capillary Latest Ref Range: 65-99 mg/dL 621 (H) 308 (H)    Home DM Meds: Novolin 70/30 insulin- 15 units bidwc  Humalog 1-7 units tid per SSI  Current Insulin Orders: Lantus 15 units QHS        Novolog Moderate Correction Scale/ SSI (0-15 units) TID AC + HS     -Note patient transitioned off IV insulin drip last night.  Insulin drip stopped at 6pm but Lantus not given until 9pm last evening.  DKA transition protocol not followed since RN stopped insulin drip before giving Lantus insulin (per protocol, insulin drip should be continued for 1-2 hours after basal insulin administered).  -Note glucose level elevated this AM (332 mg/dl).  -Concern that patient will not be able to afford Lantus/Levemir and Novolog at time of d/c.  Has been getting Novolin Reli-On 70/30 insulin (generic) at [facility] for $25 per vial per notes.  Patient currently only has Medicare coverage and there are no coupon programs available for patients with Medicare insurance.  Note MD has asked care management to look into the cost of Lantus/Levemir and Novolog for patient at time of d/c.    --Will follow patient during hospitalization--  Ambrose Finland RN, MSN, CDE Diabetes Coordinator Inpatient Glycemic Control Team Team Pager: [phone] (8a-5p)

## 2015-11-06 DIAGNOSIS — E1010 Type 1 diabetes mellitus with ketoacidosis without coma: Secondary | ICD-10-CM

## 2015-11-06 LAB — BASIC METABOLIC PANEL
ANION GAP: 8 (ref 5–15)
BUN: 25 mg/dL — ABNORMAL HIGH (ref 6–20)
CALCIUM: 10 mg/dL (ref 8.9–10.3)
CO2: 25 mmol/L (ref 22–32)
Chloride: 103 mmol/L (ref 101–111)
Creatinine, Ser: 1.21 mg/dL — ABNORMAL HIGH (ref 0.44–1.00)
GFR, EST NON AFRICAN AMERICAN: 52 mL/min — AB (ref >60)
GLUCOSE: 370 mg/dL — AB (ref 65–99)
Potassium: 4.9 mmol/L (ref 3.5–5.1)
SODIUM: 136 mmol/L (ref 135–145)

## 2015-11-06 LAB — GLUCOSE, CAPILLARY
GLUCOSE-CAPILLARY: 392 mg/dL — AB (ref 65–99)
Glucose-Capillary: 188 mg/dL — ABNORMAL HIGH (ref 65–99)
Glucose-Capillary: 434 mg/dL — ABNORMAL HIGH (ref 65–99)
Glucose-Capillary: 278 mg/dL — ABNORMAL HIGH (ref 65–99)

## 2015-11-06 MED ORDER — INSULIN NPH ISOPHANE & REGULAR (70-30) 100 UNIT/ML ~~LOC~~ SUSP: 25.0000 [IU] | Freq: Two times a day (BID) | SUBCUTANEOUS | Status: DC

## 2015-11-06 MED ORDER — INSULIN REGULAR HUMAN 100 UNIT/ML IJ SOLN: 6.0000 [IU] | Freq: Three times a day (TID) | INTRAMUSCULAR | Status: DC

## 2015-11-06 MED ORDER — AMLODIPINE BESYLATE 10 MG PO TABS: 10.0000 mg | ORAL_TABLET | Freq: Every day | ORAL | Status: DC

## 2015-11-06 MED ORDER — INSULIN GLARGINE 100 UNIT/ML ~~LOC~~ SOLN: 35.0000 [IU] | Freq: Every day | SUBCUTANEOUS | Status: DC

## 2015-11-06 MED ORDER — INSULIN ASPART 100 UNIT/ML ~~LOC~~ SOLN: 5.0000 [IU] | Freq: Three times a day (TID) | SUBCUTANEOUS | Status: DC

## 2015-11-06 NOTE — Discharge Summary (Signed)
Physician Discharge Summary  [name]:[id number] DOB: September 07, 1966 DOA: 11/02/2015  PCP: No primary care provider on file.  Admit date: 11/02/2015 Discharge date: 11/06/2015  Recommendations for Outpatient Follow-up:  1. Pt will need to follow up with PCP in 2 weeks post discharge 2. Please obtain BMP and CBC in one week Discharge Diagnoses:   DKA (diabetic ketoacidoses) -Transition off of insulin infusion once acidosis has resolved- still mildly acidotic but no anion gap- cont to follow in SDU as sugars still uncontrolled and high risk for recurrence of DKA -pt states she has had dietary indiscretions -home with novolin 70/30, 25 units bid with novolin R 6 units with each meal   Vomiting -Resolved- tolerating carb modified diet  Acute thrombocytopenia -likely due to myelosuppression from acute medical illness -mild- follow - reason uncertain- cont Lovenox for DVT prophylaxis for now -remained stable without signs of bleeding   Hypothyroidism -Continue Synthroid   Hyperkalemia -Resolved   Essential hypertension -Continue amlodipine and carvedilol -amlodipine increased to  daily   GERD (gastroesophageal reflux disease) -Continue ranitidine   CKD (chronic kidney disease) stage 2- 3, GFR 60-89 ml/min  History of renal transplant - Continue immunosuppressives  Discharge Condition: stable  Disposition: home     Follow-up Information    Follow up with [facility]. Go on 11/08/2015.   Why:  11 am is the appointment time   Contact information:   [address] [place] [address] [phone]      Diet:carb modified Wt Readings from Last 3 Encounters:  11/05/15 100 kg (220 lb 7.4 oz)  09/26/15 101.152 kg (223 lb)  06/02/15 96.7 kg (213 lb 3 oz)    History of present illness:  [name] is a 50 y.o. female with insulin-dependent diabetes mellitus type 2, hypertension, hypothyroidism,  renal transplant on chronic immunosuppressive, depression who presents to the hospital for vomiting for the past 2-3 days and is found to have DKA. The patient was started on an insulin drip and intravenous fluids. The patient's anion gap resolved. The patient's insulin was adjusted. She will go home with 70/30 insulin, 25 units twice a day with Novolin R 6 units with meals. The patient was instructed to continue checking her sugars with each meal. The patient endorses dietary indiscretion resulting in her hemoglobin A1c on 11/02/2015.  Discharge Exam: Filed Vitals:   11/06/15 0600 11/06/15 0800  BP: 167/68 150/88  Pulse: 67   Temp:  97.3 F (36.3 C)  Resp: 15    Filed Vitals:   11/06/15 0000 11/06/15 0400 11/06/15 0600 11/06/15 0800  BP:   167/68 150/88  Pulse:   67   Temp: 98.9 F (37.2 C) 99.2 F (37.3 C)  97.3 F (36.3 C)  TempSrc: Oral Oral  Oral  Resp:   15   Height:      Weight:      SpO2:   94%    General: A&O x 3, NAD, pleasant, cooperative Cardiovascular: RRR, no rub, no gallop, no S3 Respiratory: CTAB, no wheeze, no rhonchi Abdomen:soft, nontender, nondistended, positive bowel sounds Extremities: No edema, No lymphangitis, no petechiae  Discharge Instructions     Medication List    STOP taking these medications        insulin lispro 100 UNIT/ML injection  Commonly known as:  HUMALOG     mupirocin ointment 2 %  Commonly known as:  BACTROBAN      TAKE these medications  acetaminophen 500 MG tablet  Commonly known as:  TYLENOL  Take 500-1,000 mg by mouth every 6 (six) hours as needed for headache.     amLODipine 10 MG tablet  Commonly known as:  NORVASC  Take 1 tablet (10 mg total) by mouth daily.     carvedilol 25 MG tablet  Commonly known as:  COREG  Take 2 tablets (50 mg total) by mouth 2 (two) times daily.     feeding supplement (GLUCERNA SHAKE) Liqd  Take 237 mLs by mouth 3 (three) times daily between meals.     insulin NPH-regular  Human (70-30) 100 UNIT/ML injection  Commonly known as:  NOVOLIN 70/30  Inject 25 Units into the skin 2 (two) times daily with a meal.     insulin regular 100 units/mL injection  Commonly known as:  NOVOLIN R  Inject 0.06 mLs (6 Units total) into the skin 3 (three) times daily before meals.     levothyroxine 137 MCG tablet  Commonly known as:  SYNTHROID, LEVOTHROID  Take 1 tablet (137 mcg total) by mouth daily. BRAND NAME ONLY     mycophenolate 250 MG capsule  Commonly known as:  CELLCEPT  Take 500 mg by mouth 2 (two) times daily.     predniSONE 5 MG tablet  Commonly known as:  DELTASONE  Take 5 mg by mouth daily.     ranitidine 150 MG capsule  Commonly known as:  ZANTAC  Take 150 mg by mouth daily as needed for heartburn.     tacrolimus 1 MG capsule  Commonly known as:  PROGRAF  Take 1 mg by mouth 2 (two) times daily.         The results of significant diagnostics from this hospitalization (including imaging, microbiology, ancillary and laboratory) are listed below for reference.    Significant Diagnostic Studies: Dg Chest 2 View  11/02/2015  CLINICAL DATA:  Evaluate for pneumonia.  Cough and sneezing. EXAM: CHEST  2 VIEW COMPARISON:  Chest radiograph 09/25/2015. FINDINGS: The heart size and mediastinal contours are within normal limits. Both lungs are clear. The visualized skeletal structures are unremarkable. IMPRESSION: No active cardiopulmonary disease. Electronically Signed   By: Annia Belt M.D.   On: 11/02/2015 21:04     Microbiology: Recent Results (from the past 240 hour(s))  MRSA PCR Screening     Status: Abnormal   Collection Time: 11/03/15 12:26 AM  Result Value Ref Range Status   MRSA by PCR POSITIVE (A) NEGATIVE Final    Comment:        The GeneXpert MRSA Assay (FDA approved for NASAL specimens only), is one component of a comprehensive MRSA colonization surveillance program. It is not intended to diagnose MRSA infection nor to guide or monitor  treatment for MRSA infections. RESULT CALLED TO, READ BACK BY AND VERIFIED WITH: Q.IMBENAMA,RN AT 9604 ON 11/03/15 BY W.SHEA      Labs: Basic Metabolic Panel:  Recent Labs Lab 11/03/15 0224 11/03/15 5409 11/03/15 1106  11/03/15 2336 11/04/15 0345 11/05/15 0311 11/05/15 1453 11/06/15 0317  NA 133* 134* 136  < > 139 138 140 136 136  K 5.0 5.0 4.2  < > 4.2 4.3 4.1 4.2 4.9  CL 105 107 108  < > 113* 112* 112* 105 103  CO2 12* 17* 20*  < > 19* 19* 21* 20* 25  GLUCOSE 449* 212* 119*  < > 98 202* 232* 373* 370*  BUN 51* 50* 51*  < > 43* 39* 20 24* 25*  CREATININE 2.00* 1.84* 1.75*  < > 1.56* 1.49* 1.21* 1.36* 1.21*  CALCIUM 9.3 8.8* 9.2  < > 9.6 9.6 9.9 9.7 10.0  MG 1.6*  --   --   --   --  2.1  --   --   --   PHOS 4.7* 2.9 2.5  --   --   --   --   --   --   < > = values in this interval not displayed. Liver Function Tests:  Recent Labs Lab 11/02/15 2123  AST 15  ALT 15  ALKPHOS 122  BILITOT 1.8*  PROT 6.6  ALBUMIN 3.1*   No results for input(s): LIPASE, AMYLASE in the last 168 hours. No results for input(s): AMMONIA in the last 168 hours. CBC:  Recent Labs Lab 11/02/15 2123 11/04/15 0345 11/05/15 0311  WBC 7.9 4.6 3.5*  NEUTROABS 7.0  --   --   HGB 13.1 12.0 12.5  HCT 41.9 38.1 39.0  MCV 93.3 88.6 88.2  PLT 194 149* 143*   Cardiac Enzymes: No results for input(s): CKTOTAL, CKMB, CKMBINDEX, TROPONINI in the last 168 hours. BNP: Invalid input(s): POCBNP CBG:  Recent Labs Lab 11/04/15 2143 11/05/15 0027 11/05/15 0806 11/05/15 2131 11/06/15 0737  GLUCAP 181* 212* 332* 235* 392*    Time coordinating discharge:  Greater than 30 minutes  Signed:  Litha Lamartina, DO [facility] Pager: [phone] 11/06/2015, 10:57 AM

## 2015-11-08 ENCOUNTER — Inpatient Hospital Stay: Payer: Medicare Other

## 2016-01-08 IMAGING — CR DG ABDOMEN ACUTE W/ 1V CHEST
4 series · 4 of 4 positions shown · non-contrast
Comparison: August 04, 2013.

CLINICAL DATA: Nausea, vomiting.

EXAM:
ACUTE ABDOMEN SERIES (ABDOMEN 2 VIEW & CHEST 1 VIEW)

[w chest pa]
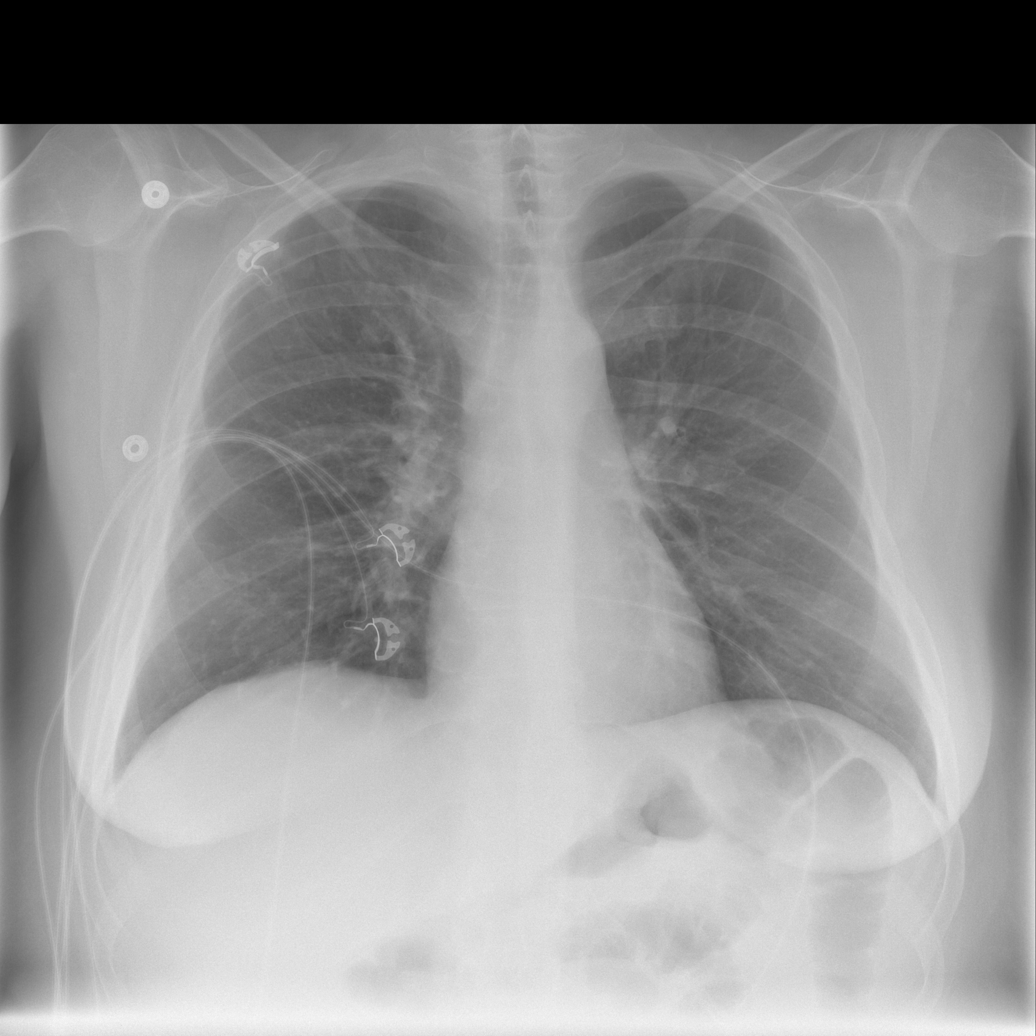

[w abdomen upright *]
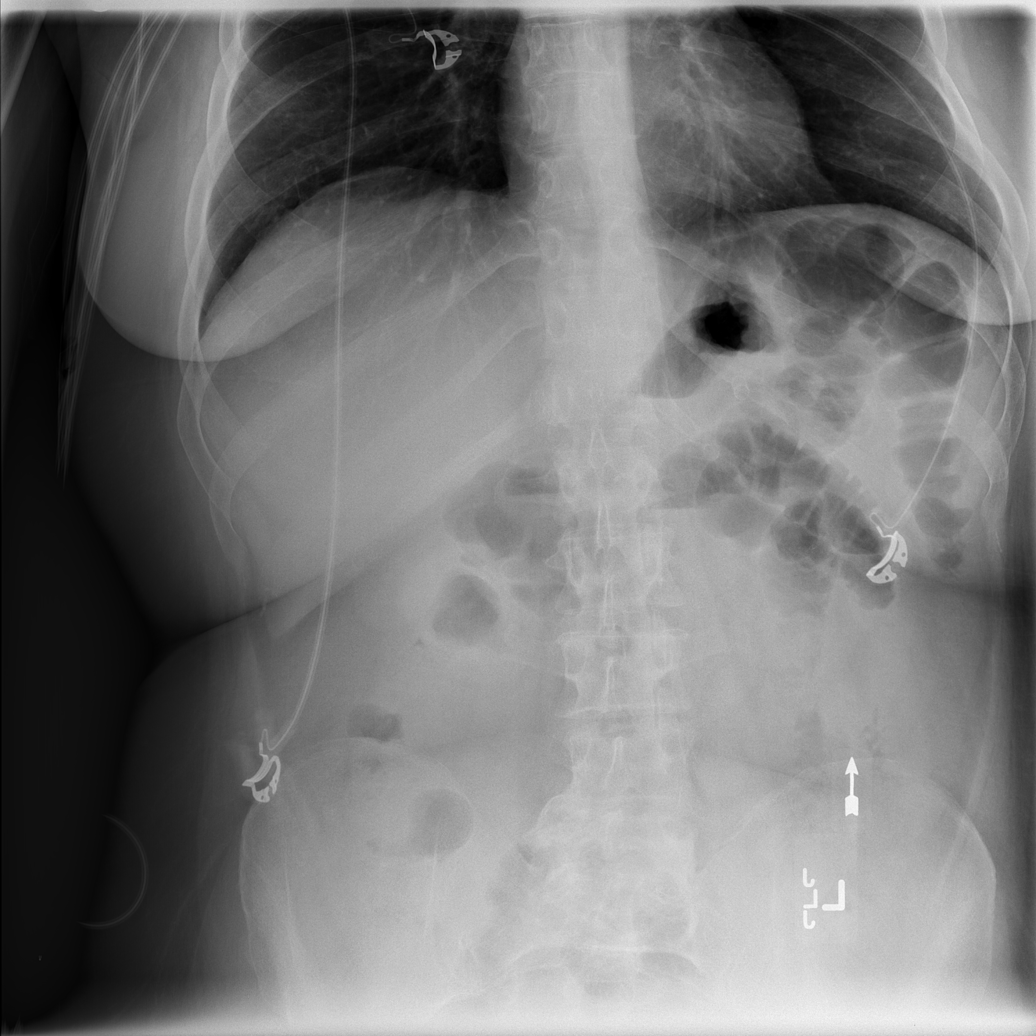

[t abdomen supine (1 of 2)]
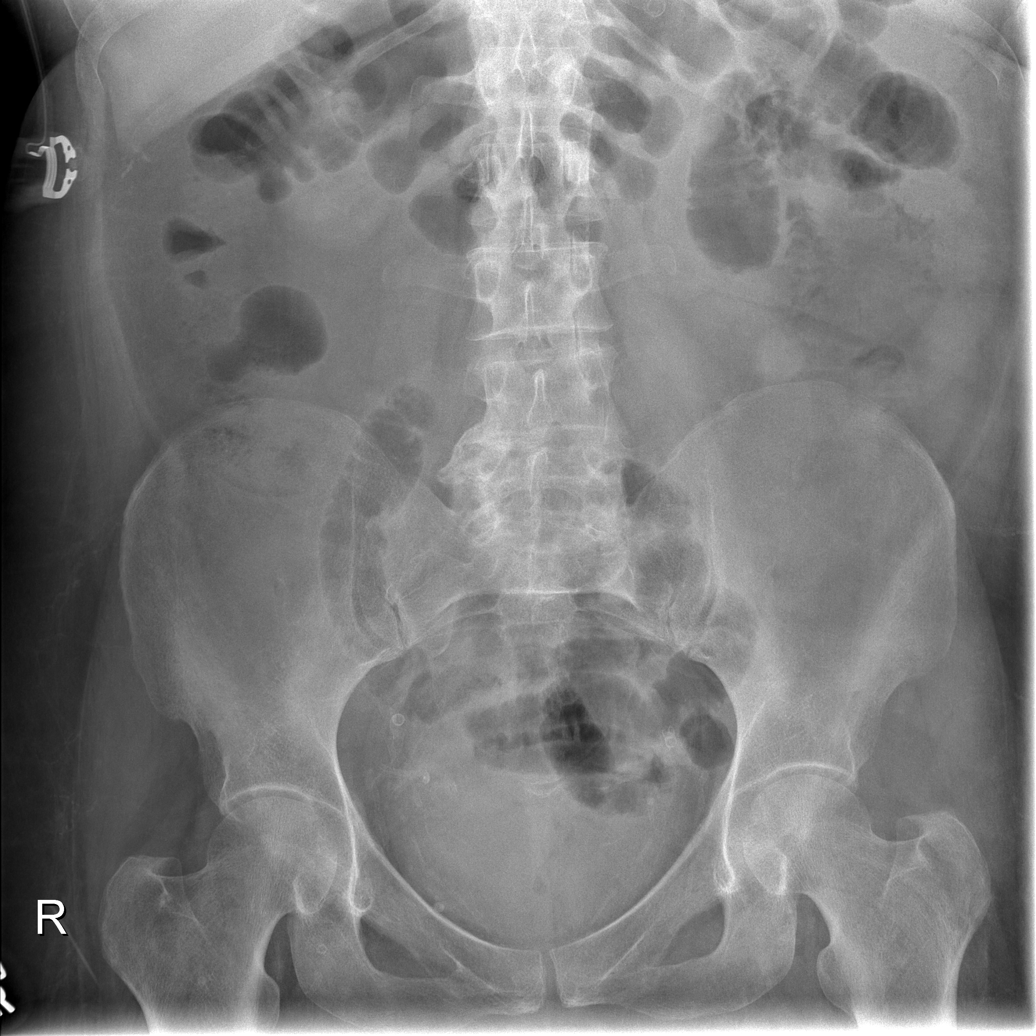

[t abdomen supine (2 of 2)]
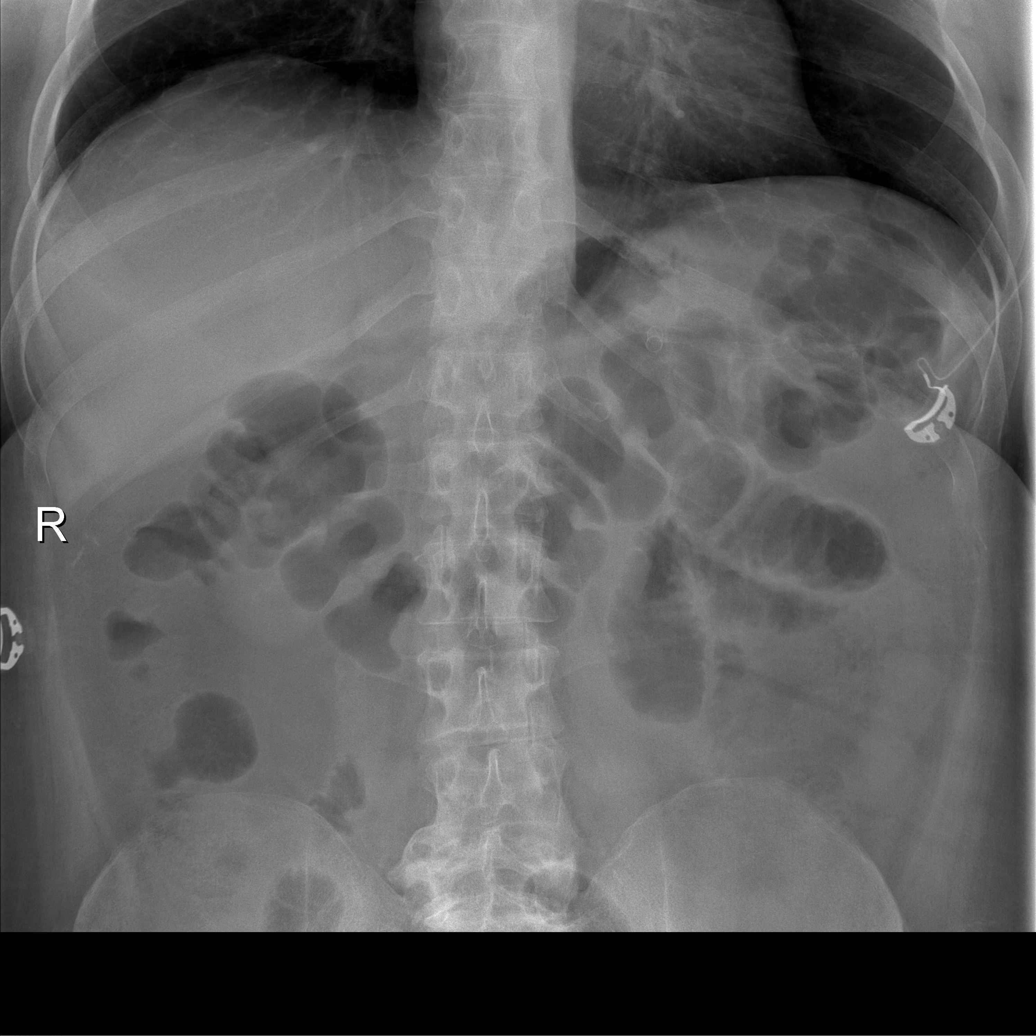

[4 of 4 positions shown; findings below may reference images not displayed]

FINDINGS: No colon dilatation is noted. Minimally dilated small bowel loops
are noted which may represent ileus or less likely small bowel
obstruction. No radiopaque calculi or other significant radiographic
abnormality is seen. Heart size and mediastinal contours are within
normal limits. Both lungs are clear.
IMPRESSION: Minimally dilated small bowel loops are noted which may represent
ileus or less likely distal small bowel obstruction. Follow-up
radiographs are recommended.

## 2016-01-09 IMAGING — CR DG ABDOMEN 1V
2 series · 2 of 2 positions shown · non-contrast
Comparison: 03/22/2014

CLINICAL DATA: Abdominal pain

EXAM:
ABDOMEN - 1 VIEW

[t abdomen supine (1 of 2)]
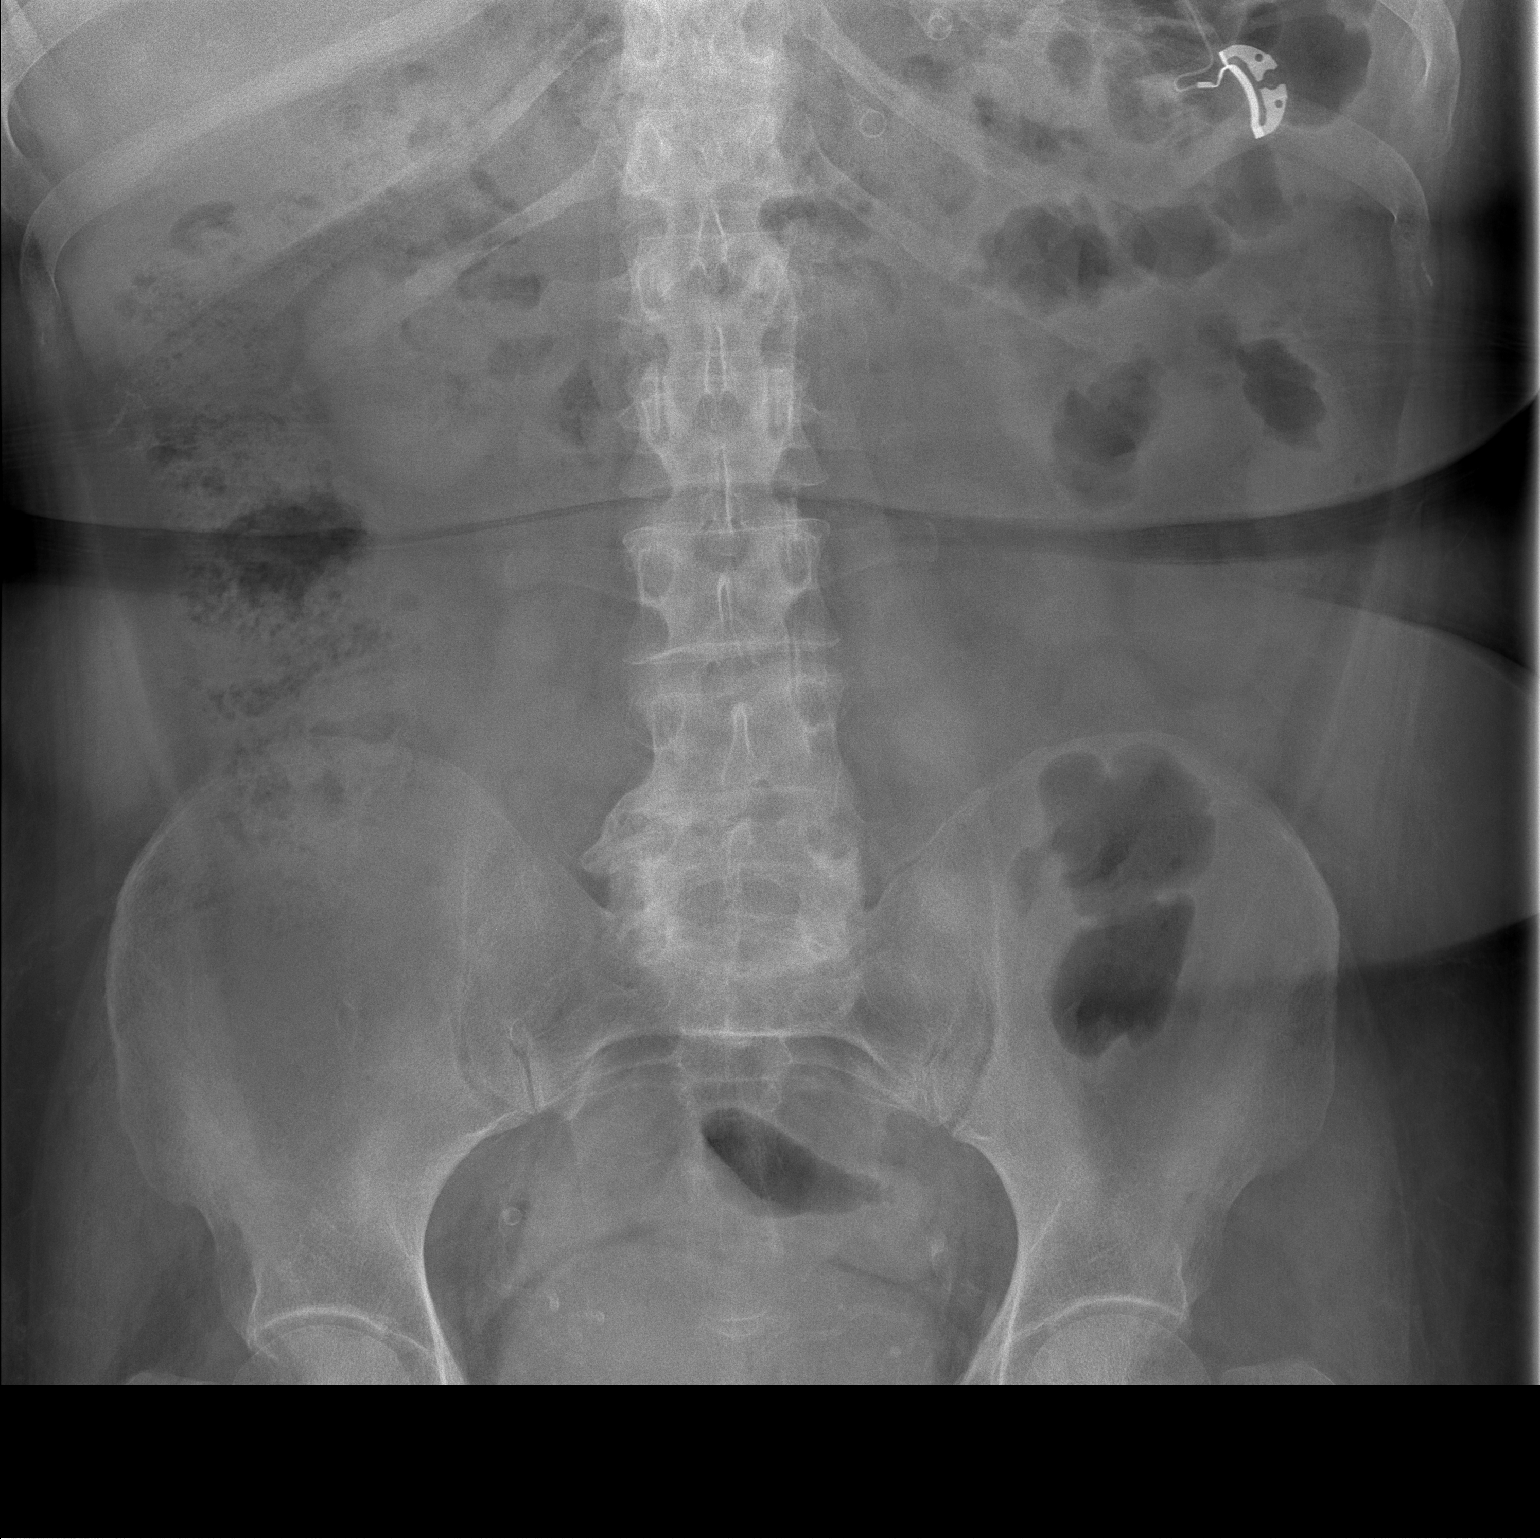

[t abdomen supine (2 of 2)]
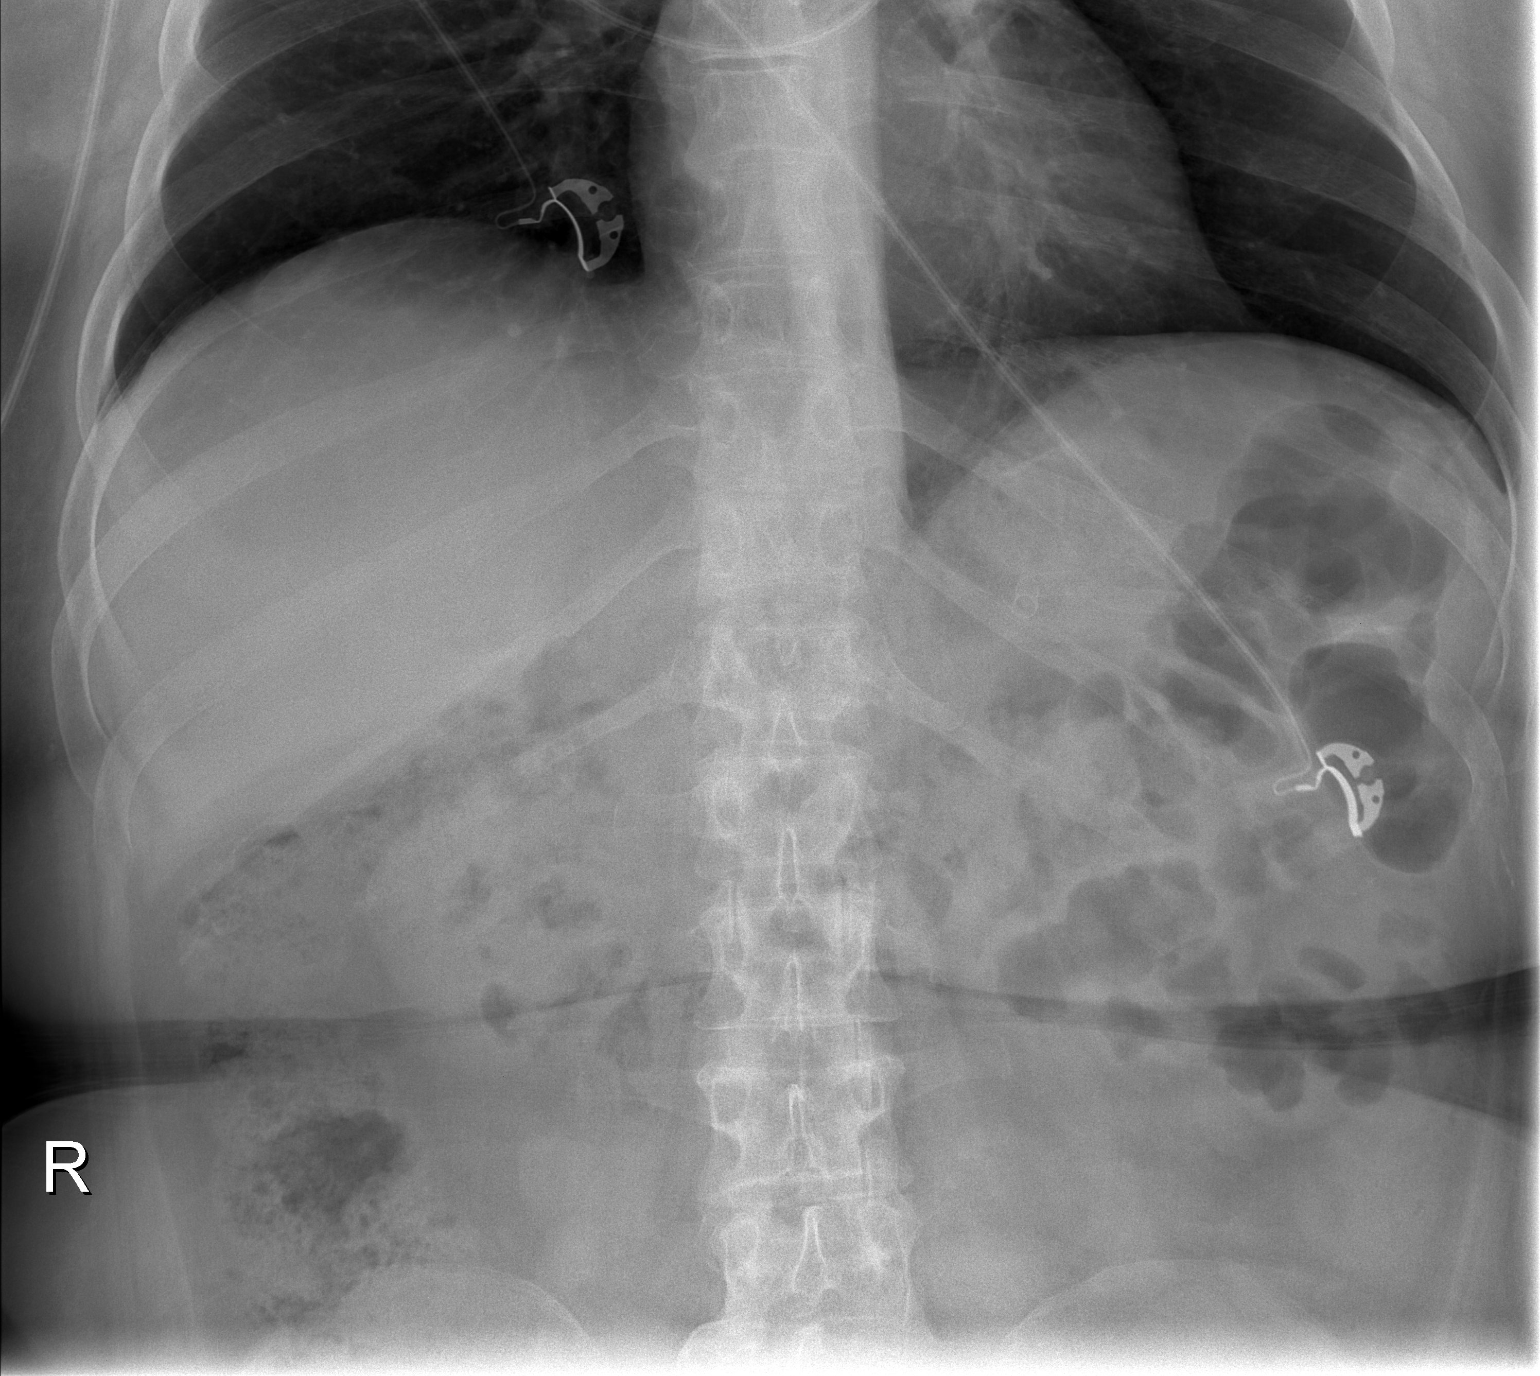

[2 of 2 positions shown; findings below may reference images not displayed]

FINDINGS: Several mildly distended left mid abdominal small bowel loops are
smaller than previously, now maximal diameter 3.2 cm. No evidence
for free air. No air-fluid level identified. No new abnormal
radiopacity.
IMPRESSION: Decreased left mid abdominal small bowel distention which may
indicate improving small bowel obstruction or focal ileus.

## 2016-02-06 IMAGING — CR DG FOOT COMPLETE 3+V*L*
3 series · 3 of 3 positions shown · non-contrast
Comparison: None.

CLINICAL DATA: CELLULITIS TOE PAIN

EXAM:
LEFT FOOT - COMPLETE 3+ VIEW

[x foot ap left]
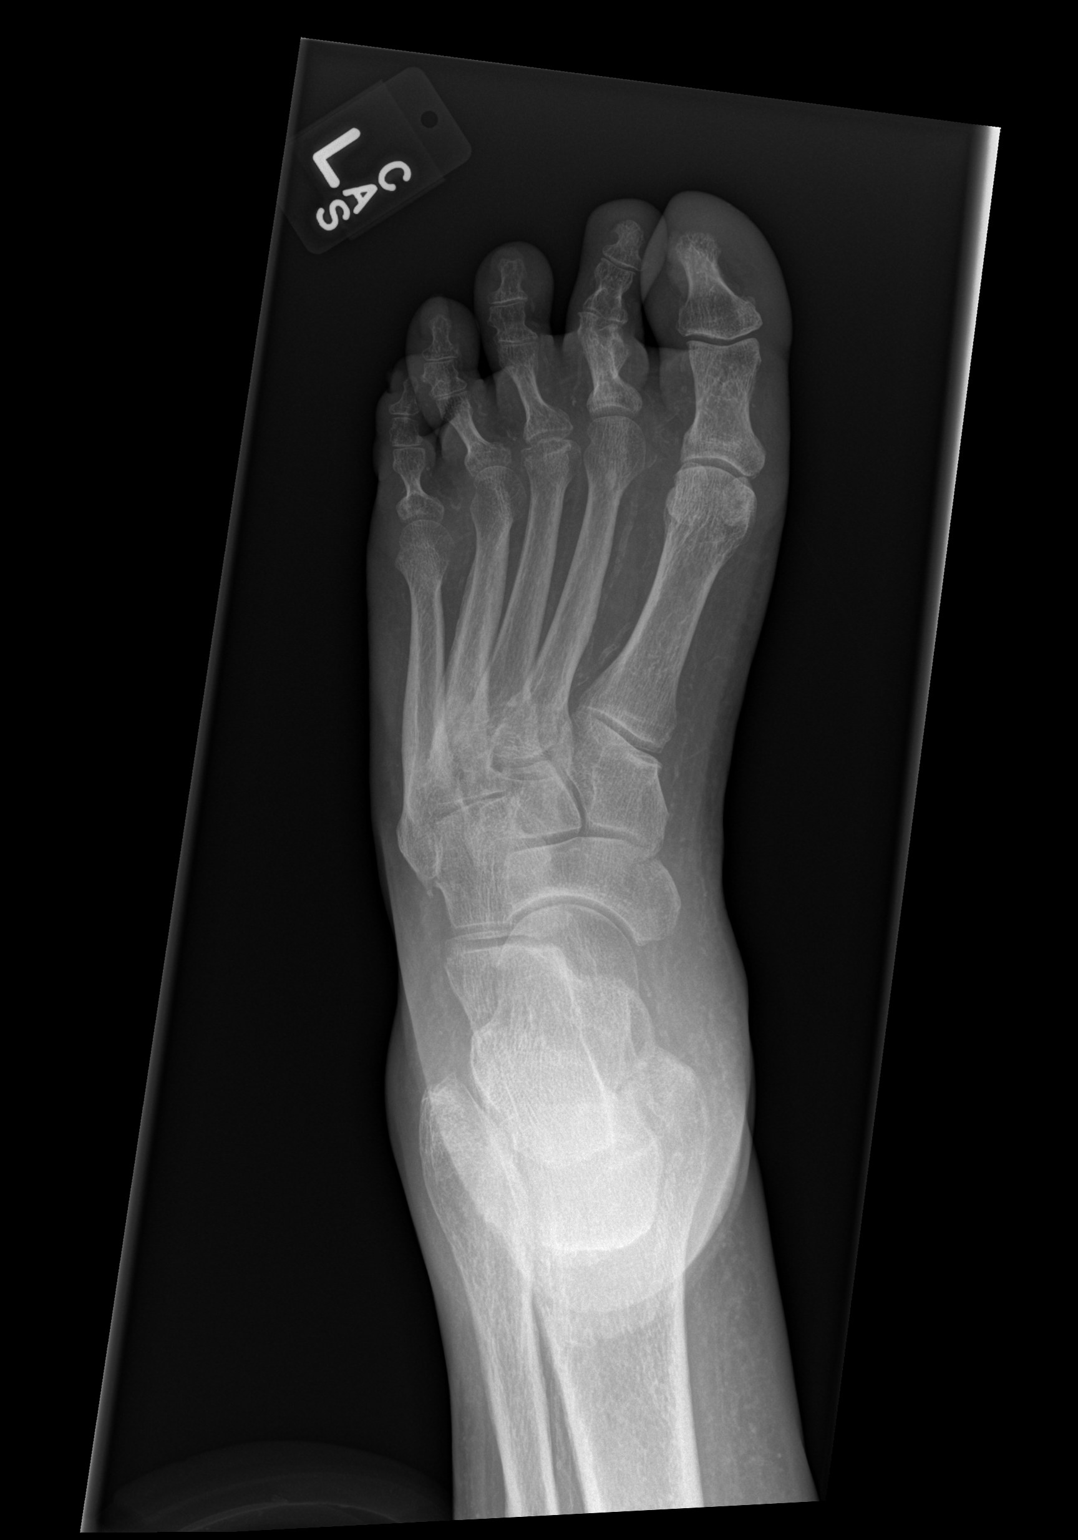

[x foot obl left]
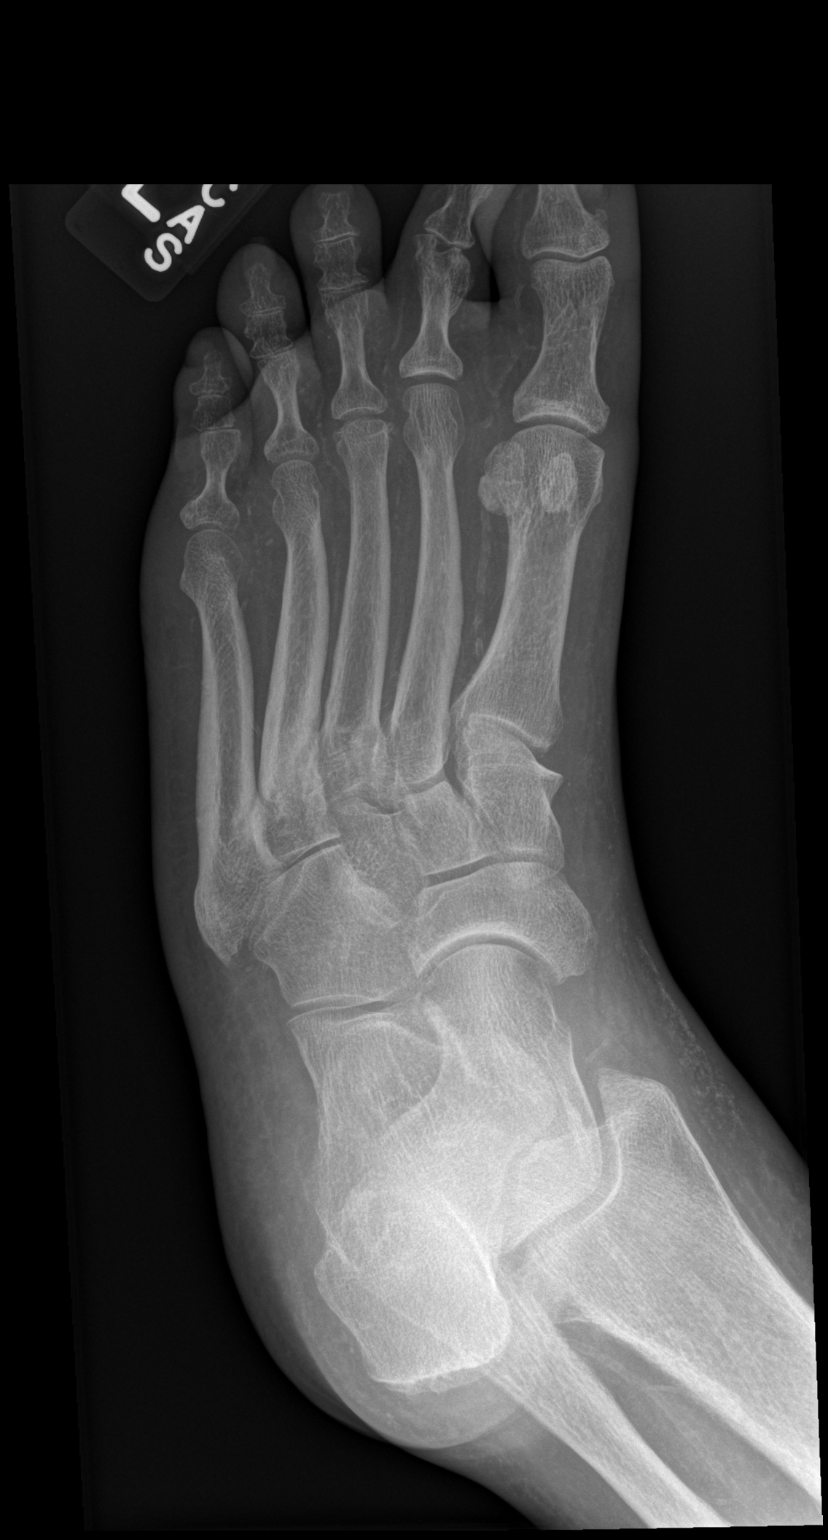

[x foot lat left]
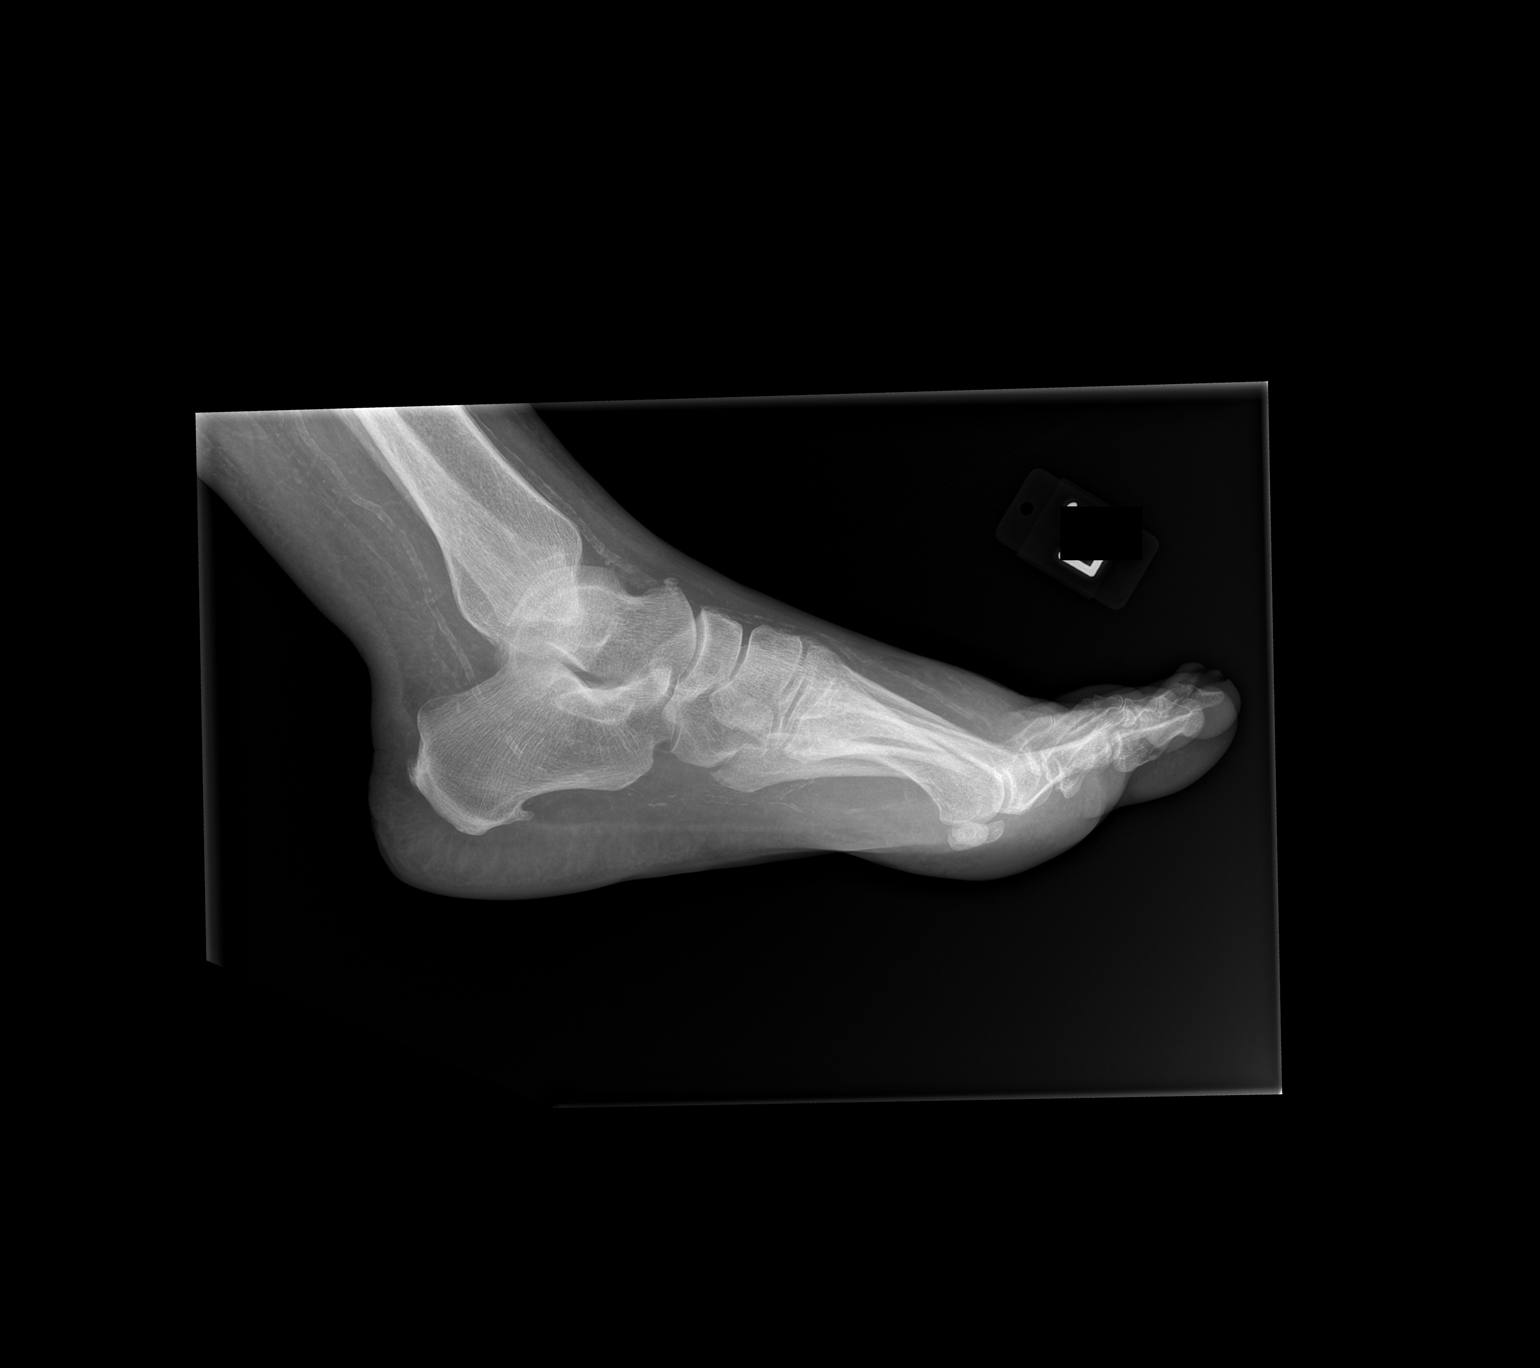

[3 of 3 positions shown; findings below may reference images not displayed]

FINDINGS: No acute fracture or dislocation. Joint spaces are maintained. No
osseous erosions to suggest active osteomyelitis. Diffuse osteopenia
present. Posterior and plantar calcaneal enthesophyte is noted.
Degenerative spurring present at the dorsal talonavicular joint.

No soft tissue emphysema or other abnormality identified. Prominent
vascular calcifications present.
IMPRESSION: No acute abnormality about the left foot.

## 2016-02-06 IMAGING — CR DG TOE GREAT 2+V*L*
3 series · 3 of 3 positions shown · non-contrast
Comparison: None.

CLINICAL DATA: CELLULITIS TOE PAIN

EXAM:
LEFT GREAT TOE

[x toes ap left]
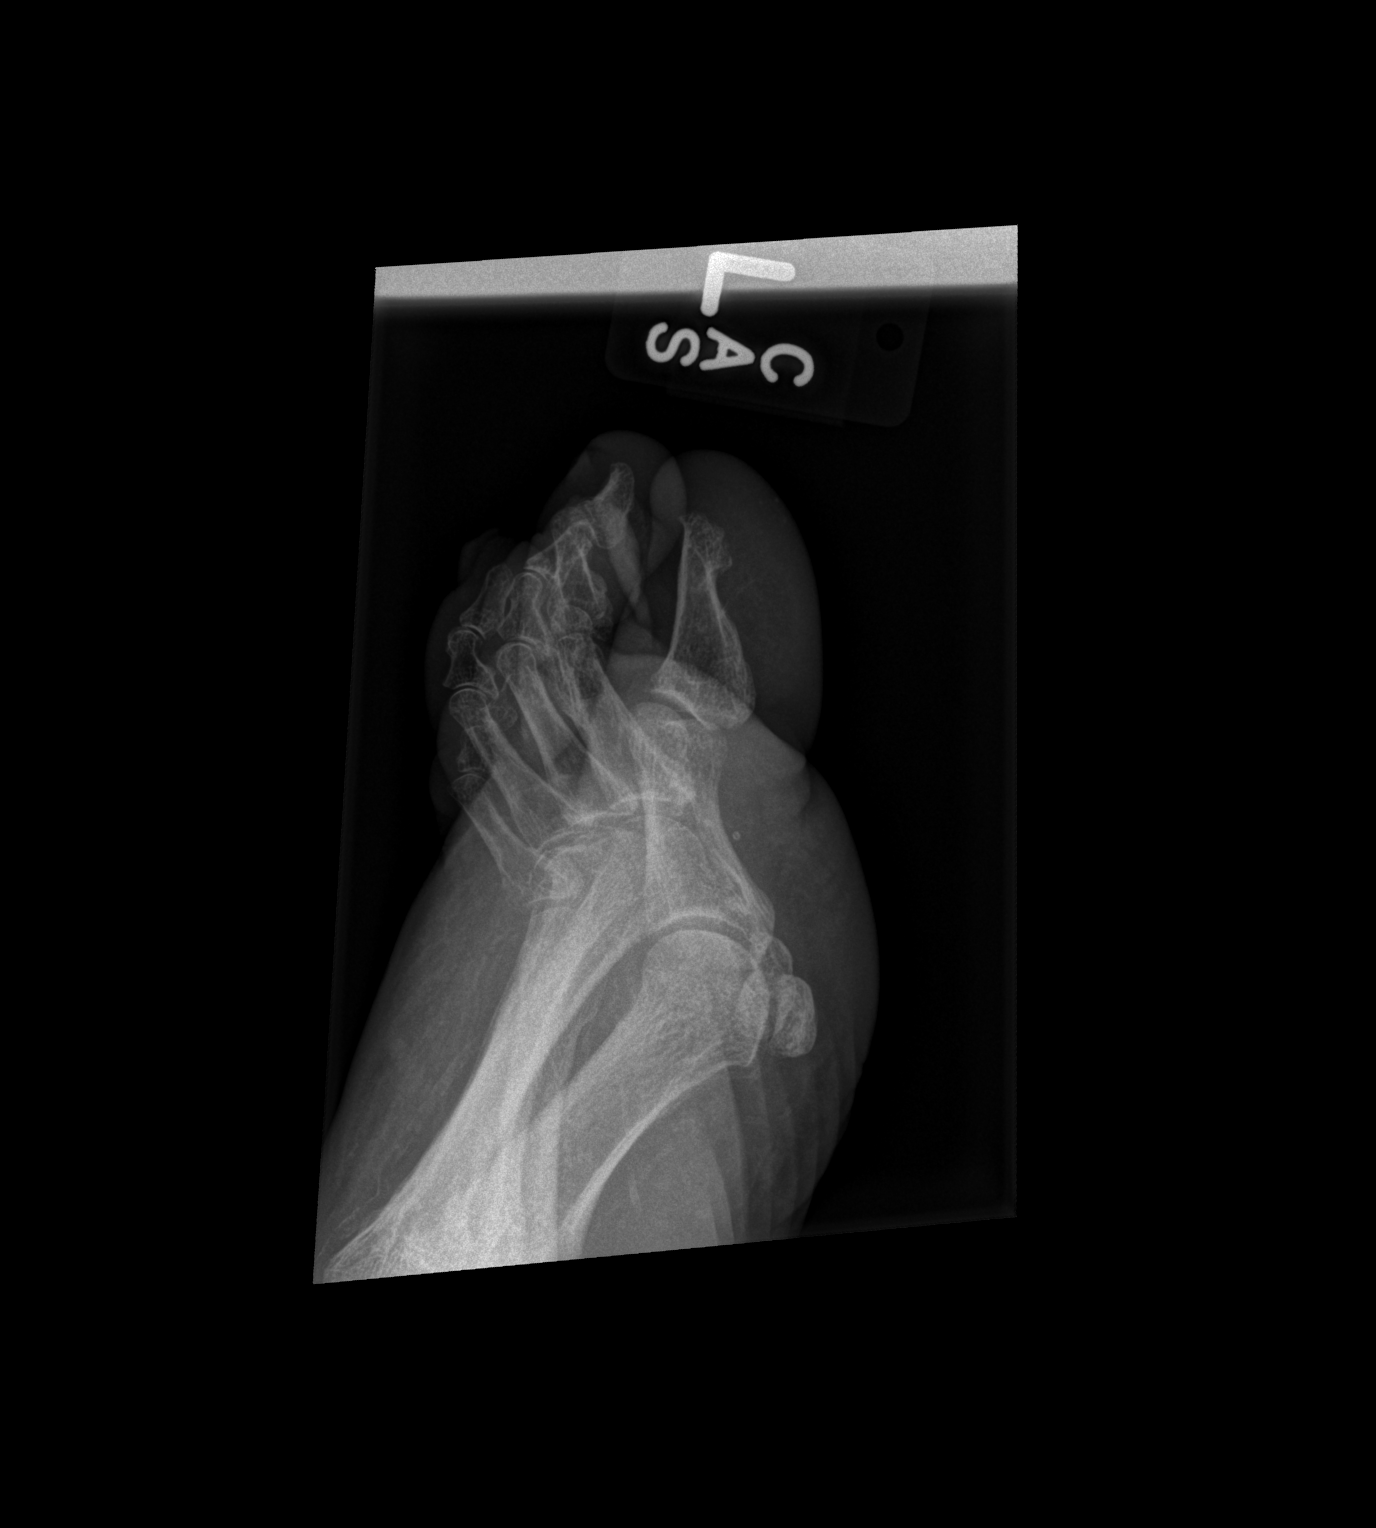

[x toes obl left (1 of 2)]
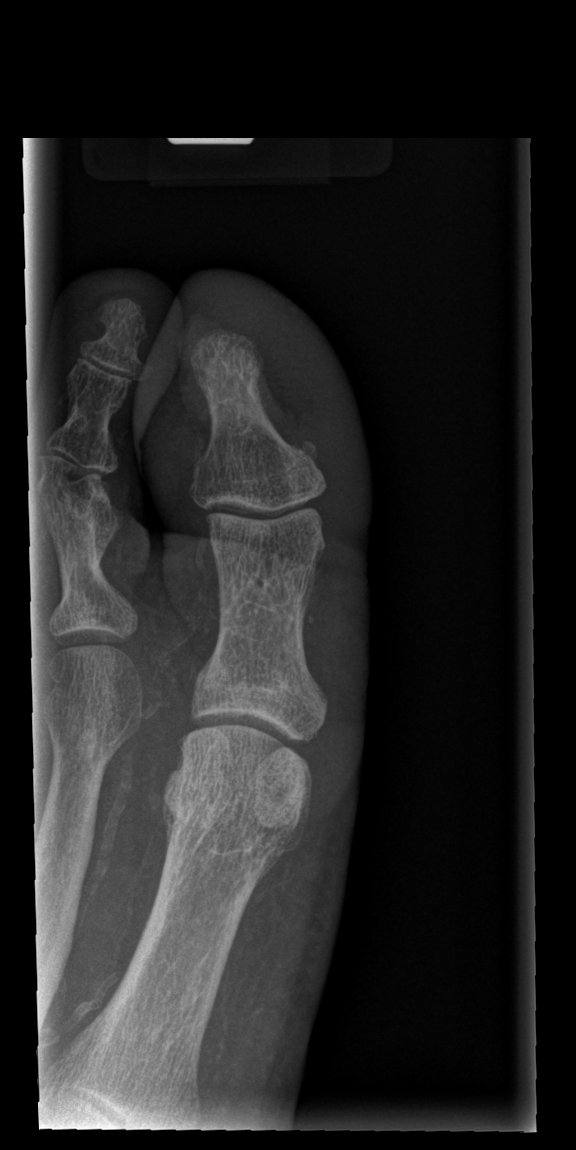

[x toes obl left (2 of 2)]
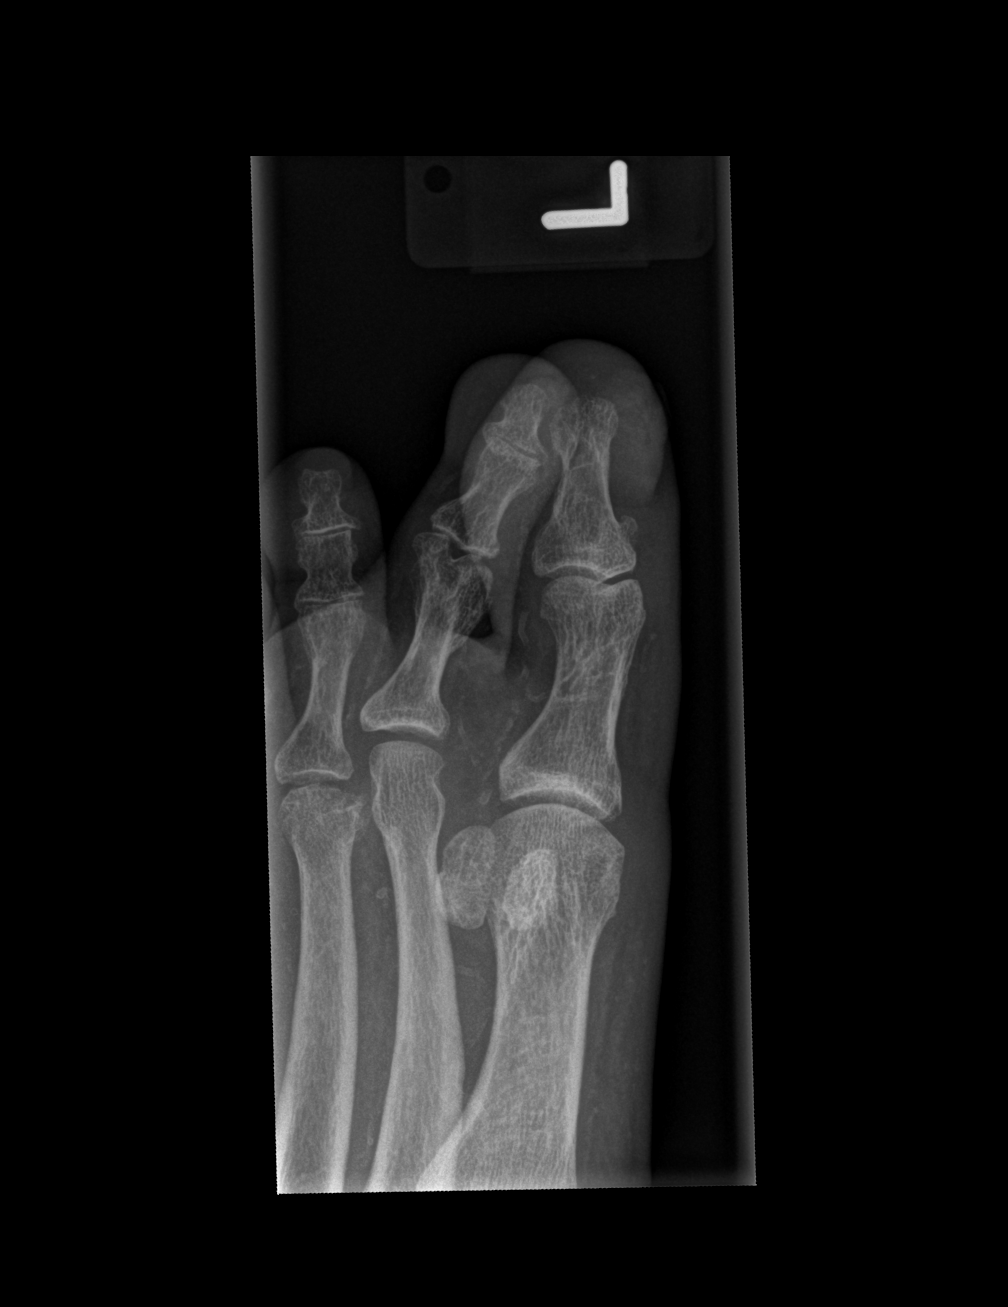

[3 of 3 positions shown; findings below may reference images not displayed]

FINDINGS: No acute fracture or dislocation. No osseous erosions to suggest
active osteomyelitis identified. Joint spaces are maintained. No
soft tissue abnormality. No radiopaque foreign body. Prominent
vascular calcifications noted.
IMPRESSION: 1. No radiographic evidence of active infection or osteomyelitis
identified.
2. No acute fracture dislocation.

## 2016-02-19 IMAGING — CR DG TOE GREAT 2+V*L*
3 series · 3 of 3 positions shown · non-contrast
Comparison: April 20, 2014.

CLINICAL DATA: Diabetic wound

EXAM:
LEFT GREAT TOE

[x toes ap left]
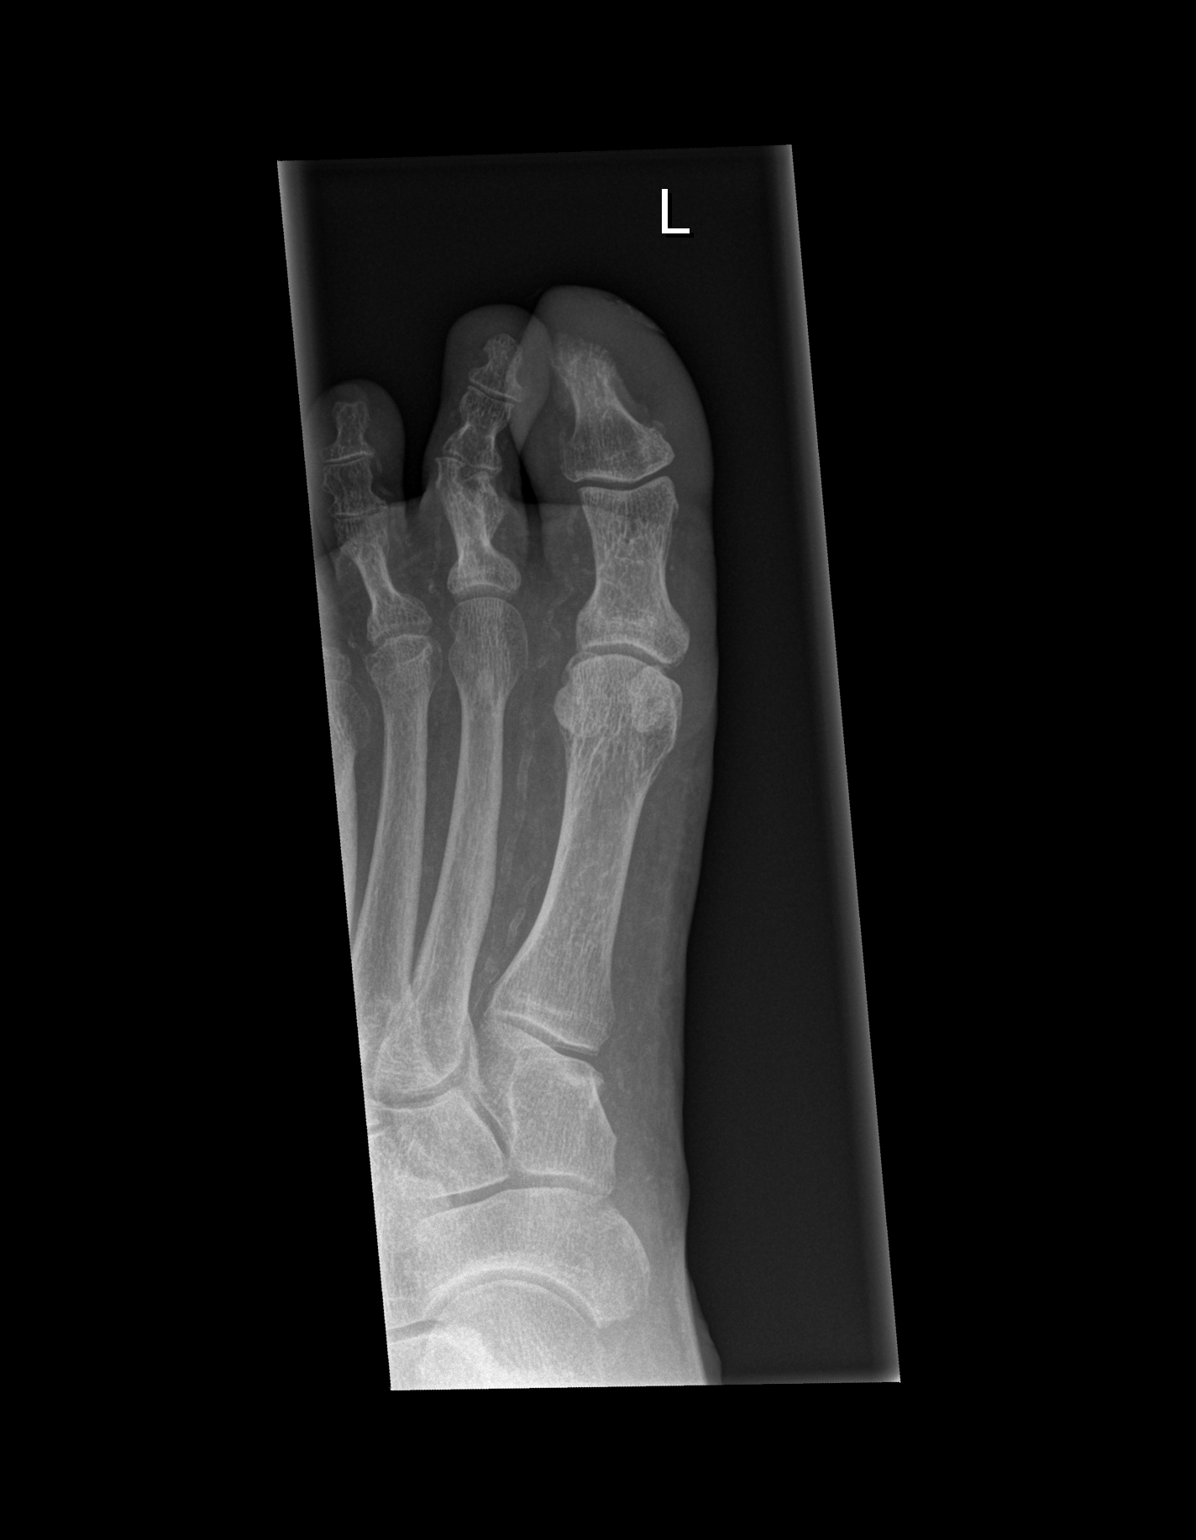

[x toes obl left]
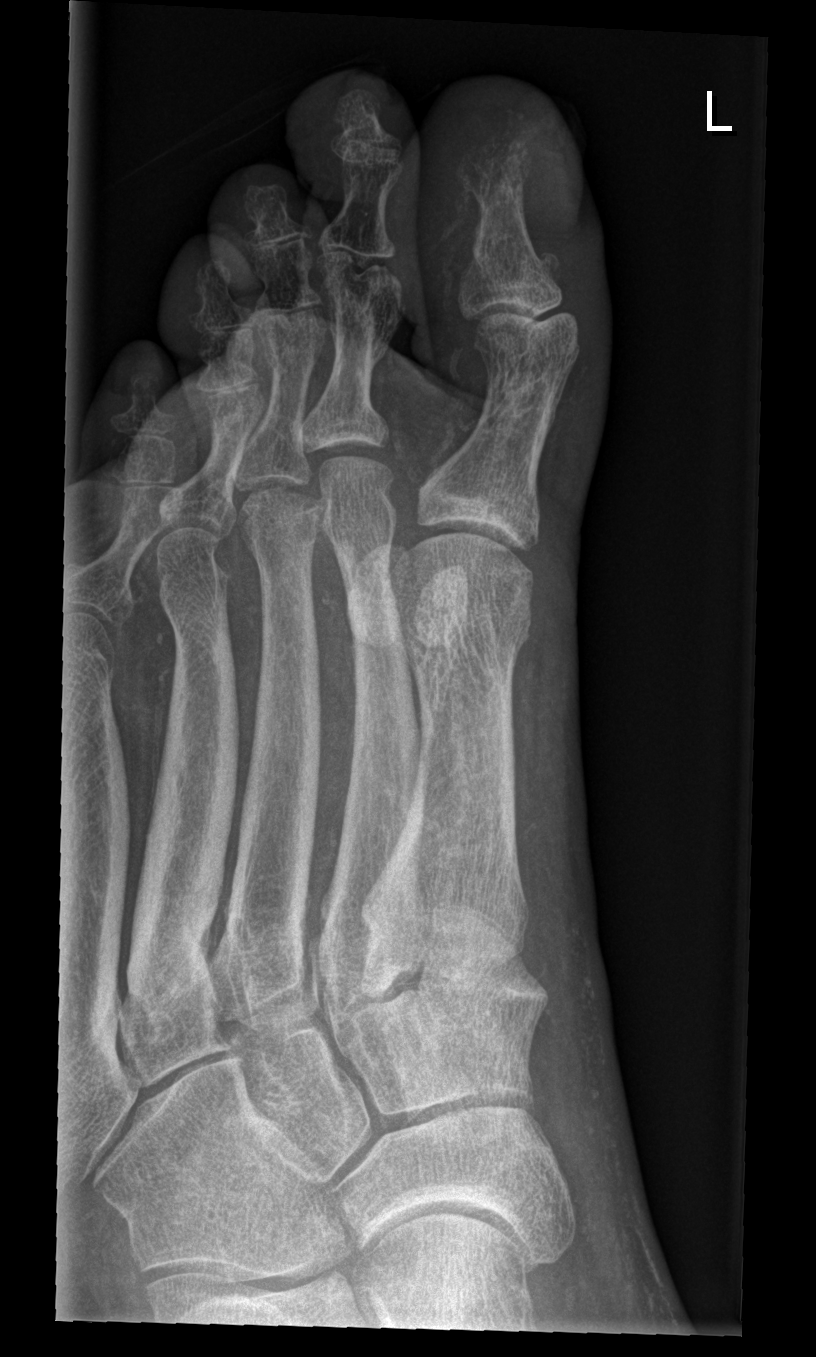

[x toes lat left]
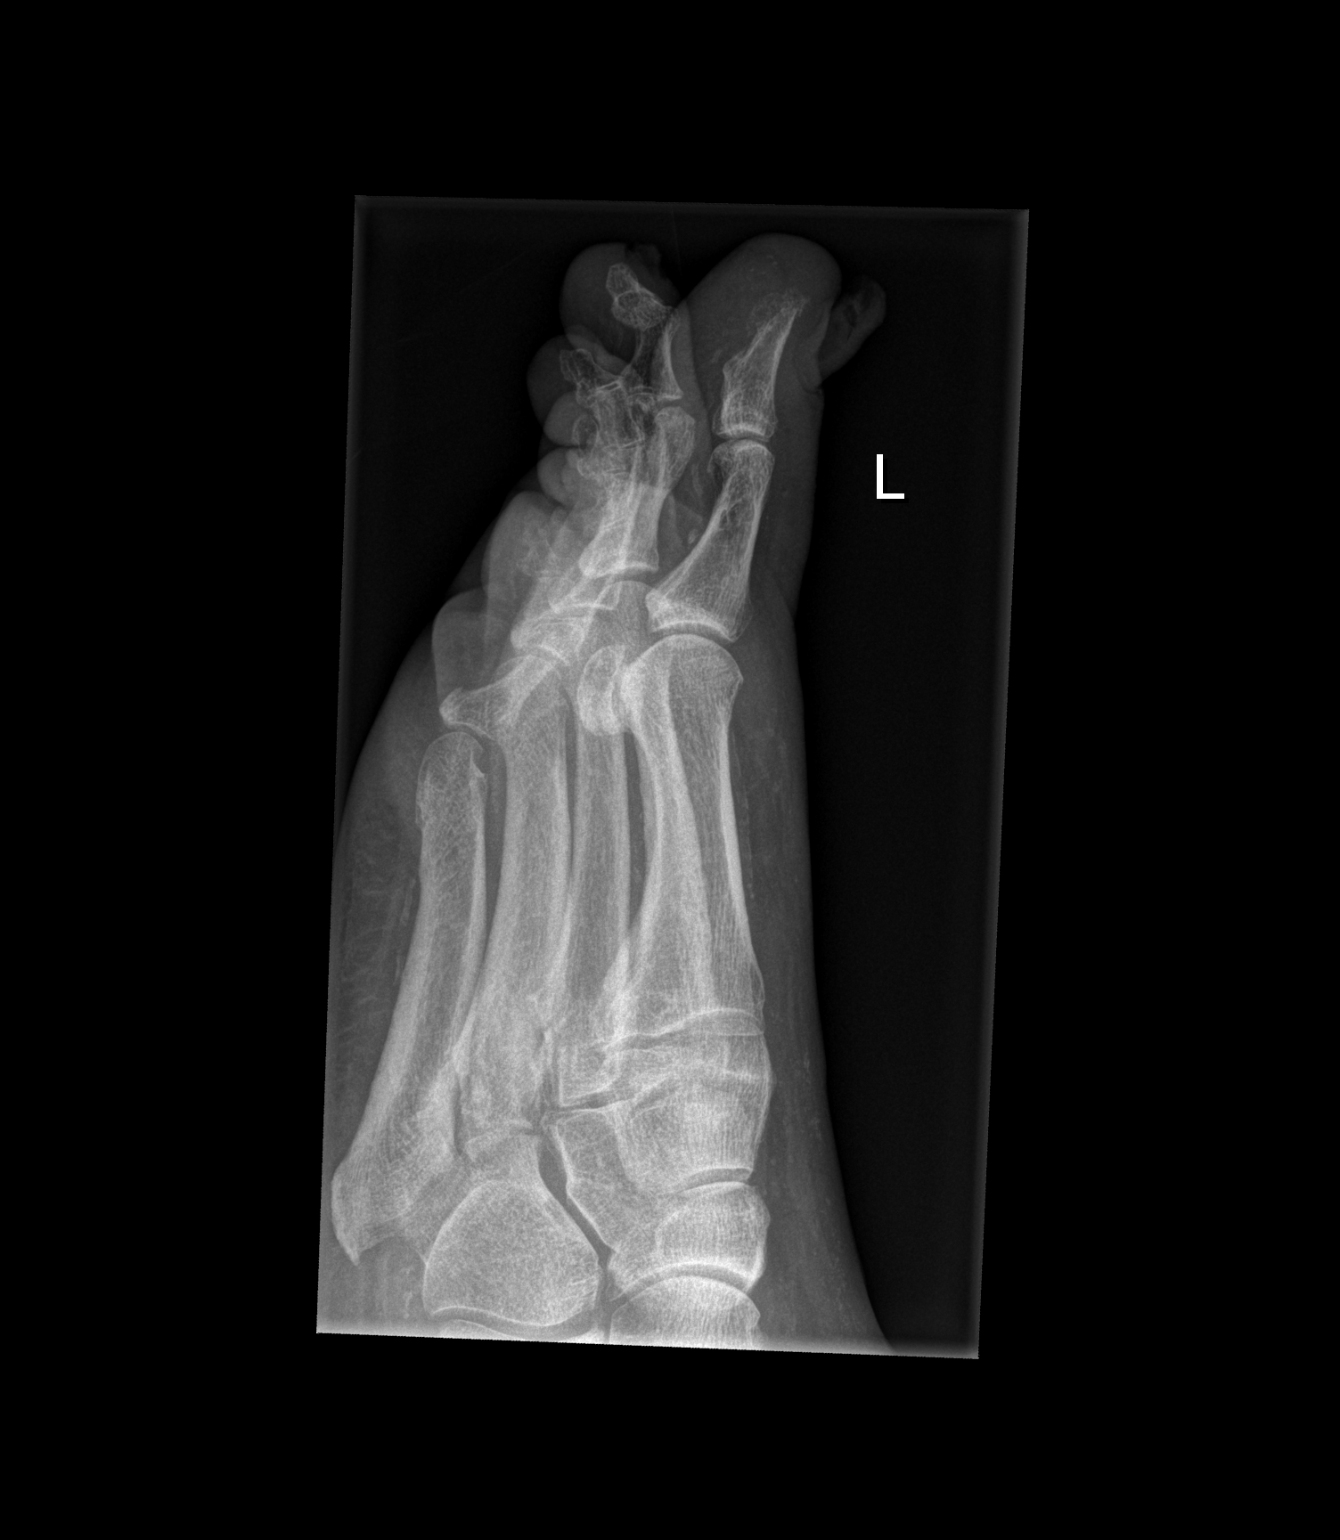

[3 of 3 positions shown; findings below may reference images not displayed]

FINDINGS: Frontal, oblique, and lateral views were obtained. There is subtle
osteomyelitis along the distal most aspect of the first distal
phalanx. No other bony destruction. There is evidence of an old
fracture with remodeling involving the second proximal phalanx.
There is narrowing of all PIP and DIP joints. There is extensive
arterial vascular calcification. No acute fracture or dislocation.
IMPRESSION: Subtle osteomyelitis involving the distal most aspect of the first
distal phalanx.

## 2016-02-21 IMAGING — US US RENAL TRANSPLANT
1 series · 14 of 25 positions shown · non-contrast
Comparison: 03/05/2013.

CLINICAL DATA: Acute renal failure. Patient has a transplant kidney
in the right lower quadrant.

EXAM:
ULTRASOUND OF RENAL TRANSPLANT WITH DOPPLER
TECHNIQUE: Ultrasound examination of the renal transplant was performed with
gray-scale, color and duplex doppler evaluation.

[Series 1: us renal transplant · 0.22mm/px · 14 of 25 slices shown]
[im 1/25]
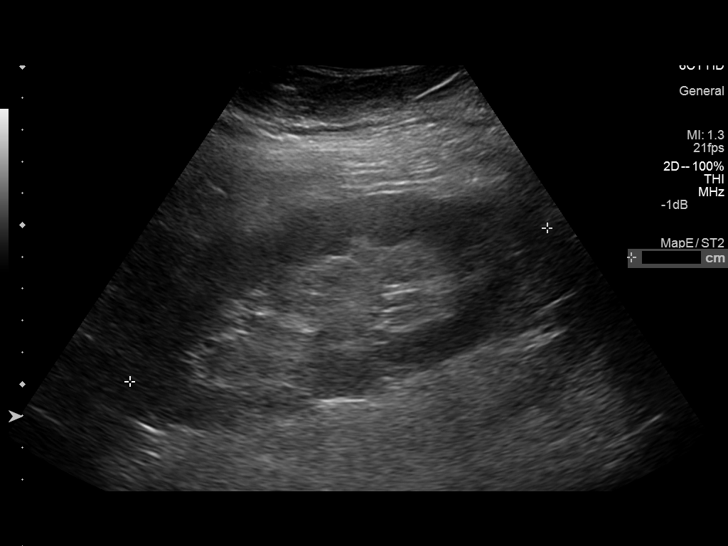
[im 3/25]
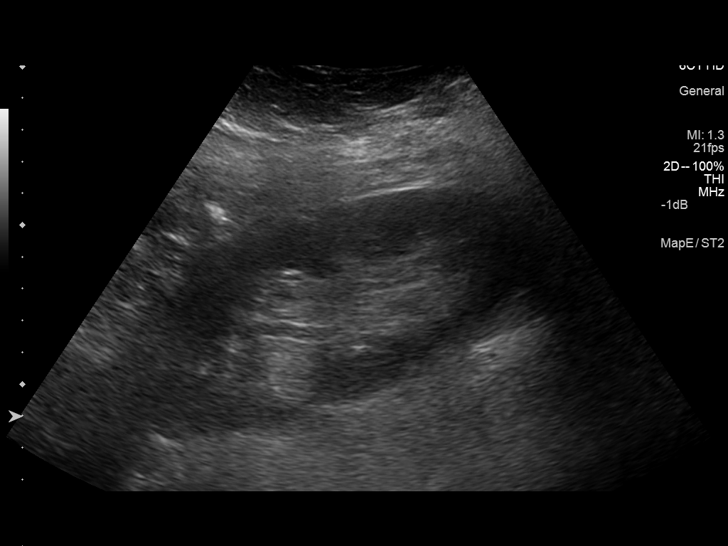
[im 5/25]
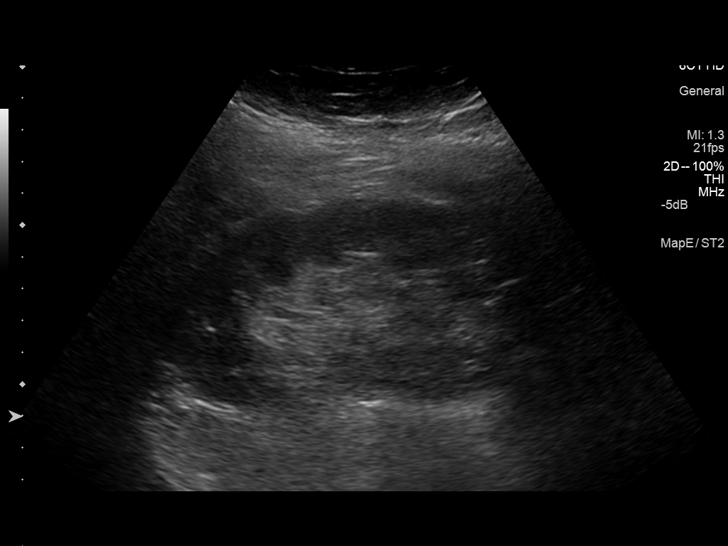
[im 7/25]
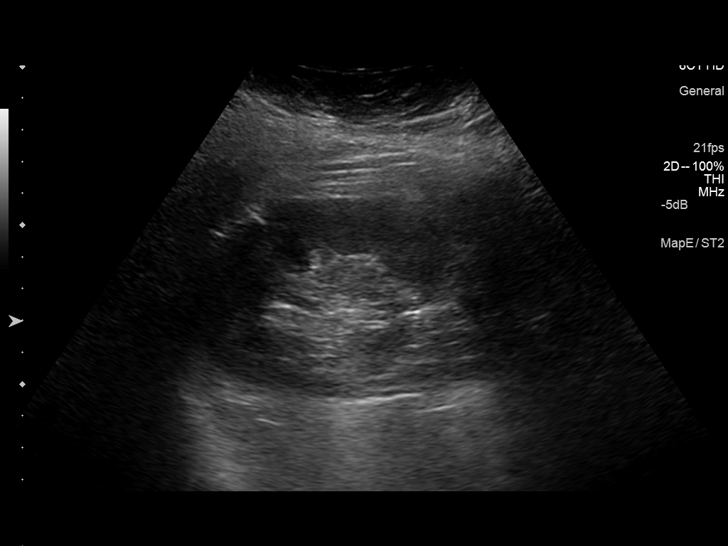
[im 9/25]
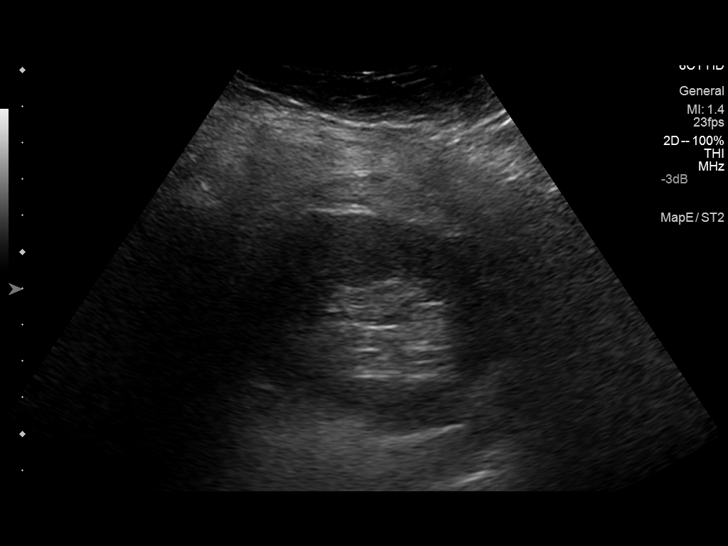
[im 10/25]
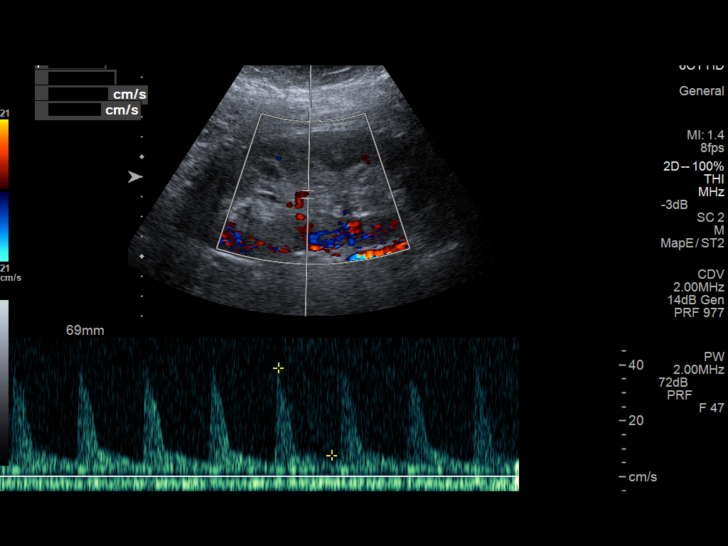
[im 12/25]
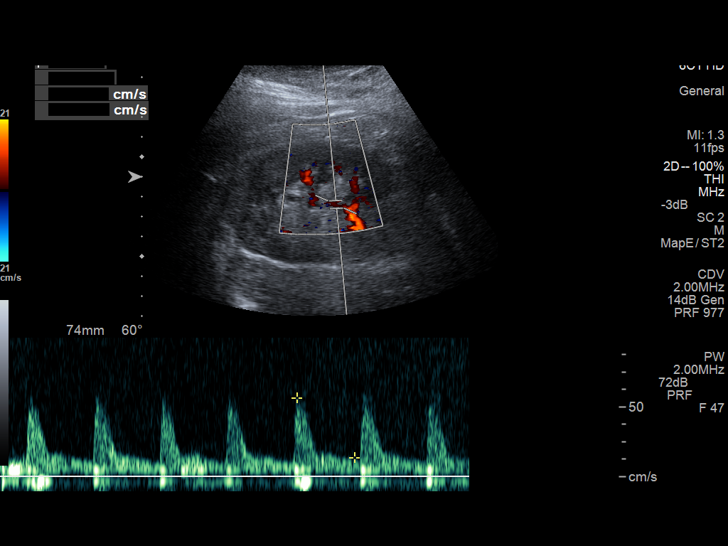
[im 14/25]
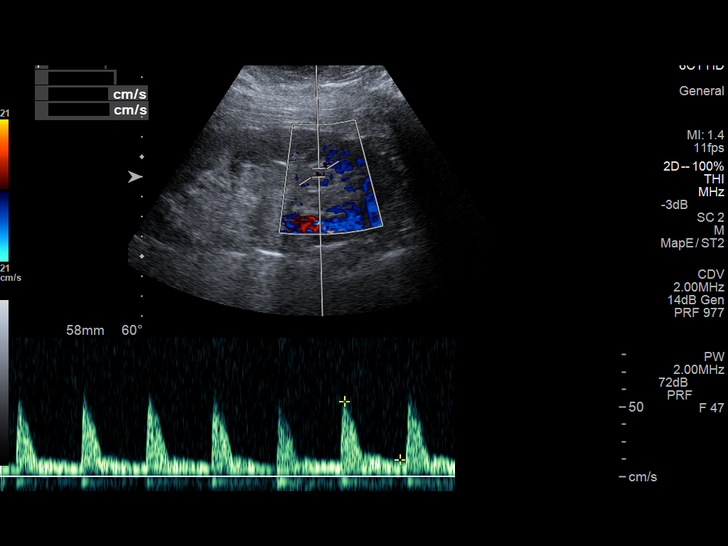
[im 16/25]
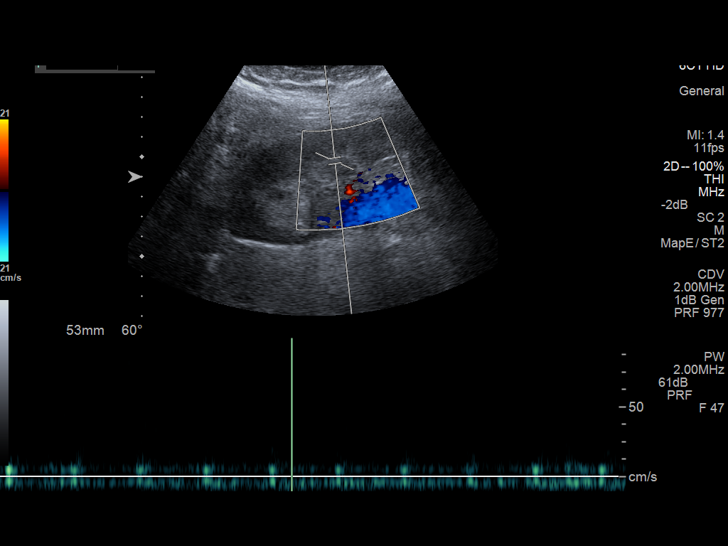
[im 17/25]
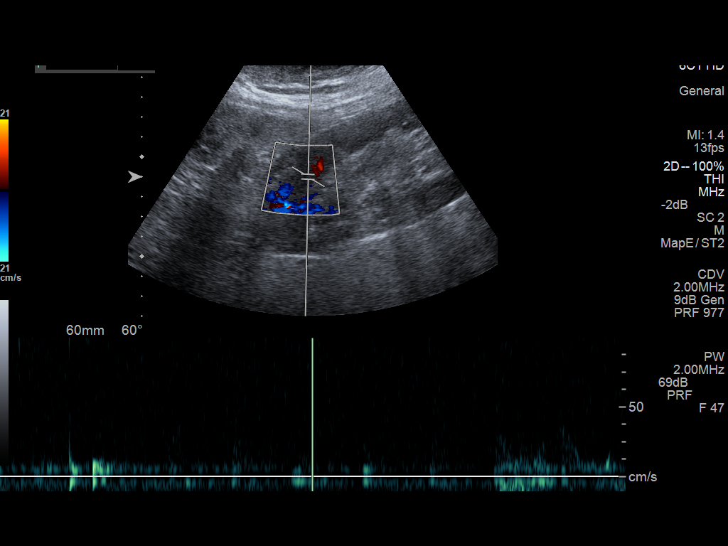
[im 19/25]
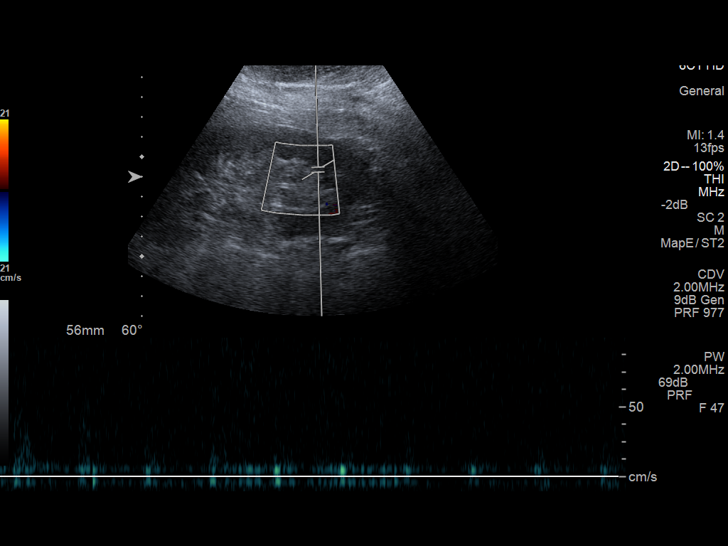
[im 21/25]
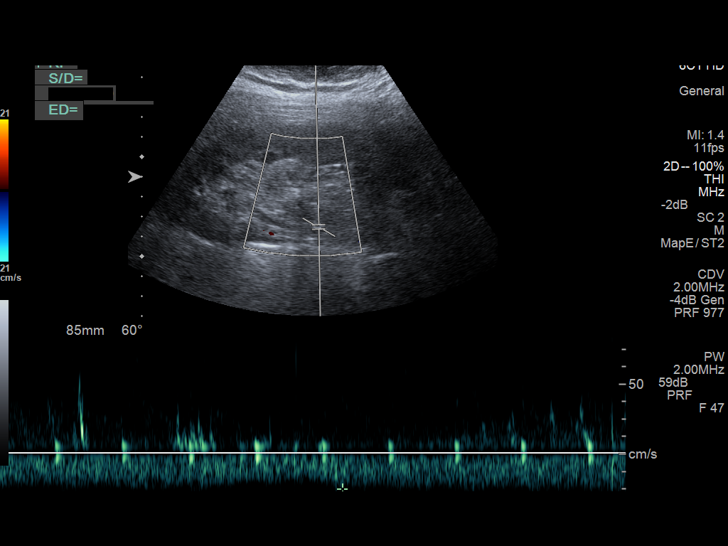
[im 23/25]
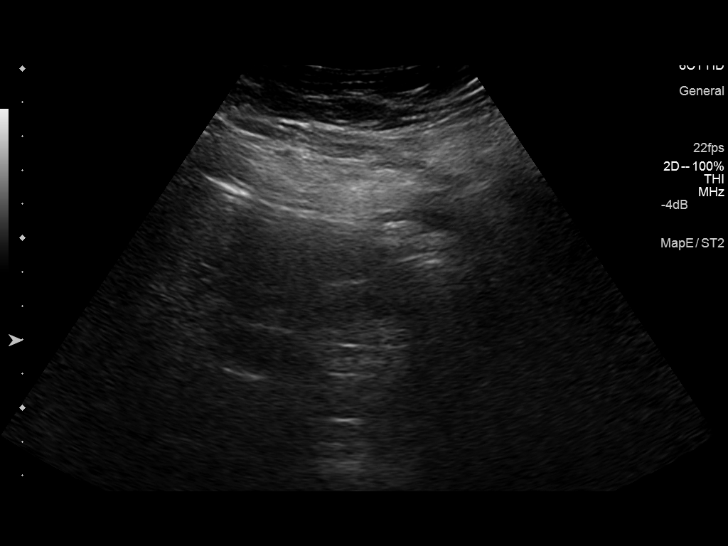
[im 25/25]
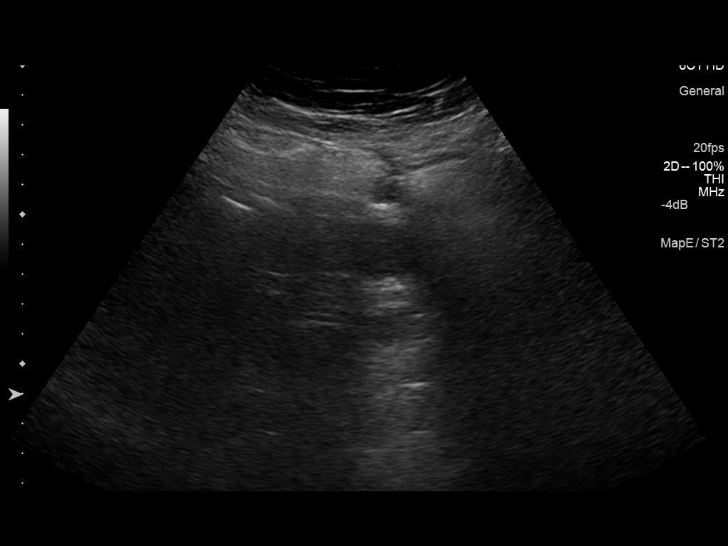

[14 of 25 positions shown; findings below may reference images not displayed]

FINDINGS: Transplant kidney location:  Right lower quadrant

Transplant kidney:

Length: 14.0. Normal in size and parenchymal echogenicity. No
evidence of mass or hydronephrosis.

Color flow in the main renal artery:  Present

Color flow in the main renal vein:  Present

Duplex Doppler Evaluation

Resistive index in main renal artery:

Venous waveform in main renal vein:  present/absent

Resistive index in upper pole intrarenal artery:

Resistive index in lower pole intrarenal artery:

Bladder: Incompletely evaluated with a Foley balloon catheter in
place.

Other findings:  No abnormal perinephric fluid collections.
IMPRESSION: 1. Transplant kidney is sonographically normal in appearance.
Specifically, no hydronephrosis and no abnormal perinephric fluid
collections.
2. Resistive indices, as above, similar compared to prior study
03/05/2013.

## 2016-03-01 ENCOUNTER — Emergency Department (HOSPITAL_COMMUNITY)
Admission: EM | Admit: 2016-03-01 | Discharge: 2016-03-01 | Disposition: A | Discharge status: Home / Self Care | Payer: Medicare Other | Attending: Emergency Medicine | Admitting: Emergency Medicine

## 2016-03-01 ENCOUNTER — Encounter (HOSPITAL_COMMUNITY): Payer: Self-pay

## 2016-03-01 DIAGNOSIS — R21 Rash and other nonspecific skin eruption: Secondary | ICD-10-CM | POA: Diagnosis not present

## 2016-03-01 DIAGNOSIS — Z794 Long term (current) use of insulin: Secondary | ICD-10-CM | POA: Insufficient documentation

## 2016-03-01 DIAGNOSIS — E1165 Type 2 diabetes mellitus with hyperglycemia: Secondary | ICD-10-CM | POA: Diagnosis not present

## 2016-03-01 DIAGNOSIS — I1311 Hypertensive heart and chronic kidney disease without heart failure, with stage 5 chronic kidney disease, or end stage renal disease: Secondary | ICD-10-CM | POA: Insufficient documentation

## 2016-03-01 DIAGNOSIS — N186 End stage renal disease: Secondary | ICD-10-CM | POA: Diagnosis not present

## 2016-03-01 DIAGNOSIS — T300 Burn of unspecified body region, unspecified degree: Secondary | ICD-10-CM

## 2016-03-01 DIAGNOSIS — H538 Other visual disturbances: Secondary | ICD-10-CM | POA: Diagnosis not present

## 2016-03-01 DIAGNOSIS — Z79899 Other long term (current) drug therapy: Secondary | ICD-10-CM | POA: Diagnosis not present

## 2016-03-01 DIAGNOSIS — R51 Headache: Secondary | ICD-10-CM | POA: Diagnosis not present

## 2016-03-01 DIAGNOSIS — M1990 Unspecified osteoarthritis, unspecified site: Secondary | ICD-10-CM | POA: Diagnosis not present

## 2016-03-01 DIAGNOSIS — E1122 Type 2 diabetes mellitus with diabetic chronic kidney disease: Secondary | ICD-10-CM | POA: Insufficient documentation

## 2016-03-01 DIAGNOSIS — F329 Major depressive disorder, single episode, unspecified: Secondary | ICD-10-CM | POA: Diagnosis not present

## 2016-03-01 DIAGNOSIS — R739 Hyperglycemia, unspecified: Secondary | ICD-10-CM

## 2016-03-01 LAB — COMPREHENSIVE METABOLIC PANEL
ALBUMIN: 3.2 g/dL — AB (ref 3.5–5.0)
ALK PHOS: 101 U/L (ref 38–126)
ALT: 13 U/L — AB (ref 14–54)
ANION GAP: 6 (ref 5–15)
AST: 15 U/L (ref 15–41)
BUN: 29 mg/dL — ABNORMAL HIGH (ref 6–20)
CHLORIDE: 103 mmol/L (ref 101–111)
CO2: 26 mmol/L (ref 22–32)
Calcium: 9.5 mg/dL (ref 8.9–10.3)
Creatinine, Ser: 1.26 mg/dL — ABNORMAL HIGH (ref 0.44–1.00)
GFR calc Af Amer: 57 mL/min — ABNORMAL LOW (ref >60)
GFR calc non Af Amer: 49 mL/min — ABNORMAL LOW (ref >60)
Glucose, Bld: 268 mg/dL — ABNORMAL HIGH (ref 65–99)
Potassium: 4 mmol/L (ref 3.5–5.1)
SODIUM: 135 mmol/L (ref 135–145)
Total Bilirubin: 0.7 mg/dL (ref 0.3–1.2)
Total Protein: 6.8 g/dL (ref 6.5–8.1)

## 2016-03-01 LAB — URINE MICROSCOPIC-ADD ON: Bacteria, UA: NONE SEEN

## 2016-03-01 LAB — CBC
HEMATOCRIT: 39.4 % (ref 36.0–46.0)
Hemoglobin: 13 g/dL (ref 12.0–15.0)
MCH: 28.5 pg (ref 26.0–34.0)
MCHC: 33 g/dL (ref 30.0–36.0)
MCV: 86.4 fL (ref 78.0–100.0)
Platelets: 158 10*3/uL (ref 150–400)
RBC: 4.56 MIL/uL (ref 3.87–5.11)
RDW: 13.2 % (ref 11.5–15.5)
WBC: 5.5 10*3/uL (ref 4.0–10.5)

## 2016-03-01 LAB — CBG MONITORING, ED
GLUCOSE-CAPILLARY: 252 mg/dL — AB (ref 65–99)
Glucose-Capillary: 283 mg/dL — ABNORMAL HIGH (ref 65–99)

## 2016-03-01 LAB — URINALYSIS, ROUTINE W REFLEX MICROSCOPIC
Bilirubin Urine: NEGATIVE
Hgb urine dipstick: NEGATIVE
Ketones, ur: NEGATIVE mg/dL
LEUKOCYTES UA: NEGATIVE
NITRITE: NEGATIVE
PH: 6 (ref 5.0–8.0)
Specific Gravity, Urine: 1.027 (ref 1.005–1.030)

## 2016-03-01 LAB — LIPASE, BLOOD: Lipase: 16 U/L (ref 11–51)

## 2016-03-01 MED ORDER — ACETAMINOPHEN 325 MG PO TABS
650.0000 mg | ORAL_TABLET | Freq: Once | ORAL | Status: AC
  Administered 2016-03-01: 650 mg via ORAL
  Filled 2016-03-01: qty 2

## 2016-03-01 MED ORDER — ONDANSETRON HCL 4 MG/2ML IJ SOLN
4.0000 mg | Freq: Once | INTRAMUSCULAR | Status: AC | PRN
  Administered 2016-03-01: 4 mg via INTRAVENOUS
  Filled 2016-03-01: qty 2

## 2016-03-01 MED ORDER — SODIUM CHLORIDE 0.9 % IV BOLUS (SEPSIS)
1000.0000 mL | Freq: Once | INTRAVENOUS | Status: AC
  Administered 2016-03-01: 1000 mL via INTRAVENOUS

## 2016-03-01 NOTE — ED Notes (Signed)
Bed: [id number] Expected date: 03/01/16 Expected time:  Means of arrival:  Comments: EMS- 50 yo Hyperglycemia

## 2016-03-01 NOTE — Discharge Instructions (Signed)
Please read and follow all provided instructions.  Your diagnoses today include:  1. Hyperglycemia   2. Rash   3. Burn    Tests performed today include:  Blood counts and electrolytes - elevated blood sugar  Vital signs. See below for your results today.   Medications prescribed:   None  Take any prescribed medications only as directed.  Home care instructions:  Follow any educational materials contained in this packet.  BE VERY CAREFUL not to take multiple medicines containing Tylenol (also called acetaminophen). Doing so can lead to an overdose which can damage your liver and cause liver failure and possibly death.   Follow-up instructions: Please follow-up with your primary care provider in the next 3 days for further evaluation of your symptoms.   Return instructions:   Please return to the Emergency Department if you experience worsening symptoms.   Please return if you have any other emergent concerns.  Additional Information:  Your vital signs today were: BP 154/80 mmHg   Pulse 77   Temp(Src) 97.8 F (36.6 C) (Oral)   Resp 18   Ht 5\' 8"  (1.727 m)   Wt 102.059 kg   BMI 34.22 kg/m2   SpO2 99%   LMP 11/19/2011 If your blood pressure (BP) was elevated above 135/85 this visit, please have this repeated by your doctor within one month. --------------

## 2016-03-01 NOTE — ED Provider Notes (Signed)
CSN: [id number]     Arrival date & time 03/01/16  1813 History   First MD Initiated Contact with Patient 03/01/16 1817     Chief Complaint  Patient presents with  . Hyperglycemia     (Consider location/radiation/quality/duration/timing/severity/associated sxs/prior Treatment) HPI Comments: Patients with history of diabetes on insulin, renal transplant on tacrolimus and mycophenolate -- presents with complaint of elevated blood sugar. Patient states that she has run out of strips and has not been able to check her levels. She has not missed any of her diabetes medications. She denies recent fevers, cold symptoms, cough, chest pain, shortness of breath, nausea, vomiting, abdominal pain or urinary symptoms. She has mild headache. No tenderness over her transplanted kidney. Patient states that her vision has been somewhat blurry. No loss of vision. She attributes this to her high blood sugar. Patient denies signs of stroke including: facial droop, slurred speech, aphasia, weakness/numbness in extremities, imbalance/trouble walking. States she has been thirsty, no increased urine.   In addition patient notes a rash to her upper back for the past several weeks. Tick bite 2 days ago, however rash preceded this. No fevers. She also sustained a burn to her left hand 2 days ago that has an associated blister.   The history is provided by the patient and medical records.    Past Medical History  Diagnosis Date  . Hypertension   . Hypothyroid   . 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 Pneumonia ~ 2007?  [name]. Type II diabetes mellitus (HCC)     Insulin dependant  . Daily headache   . Arthritis     "elbows, knees, legs, back" (09/24/2015)  . Depression     "years ago"  . Kidney disease   . End stage renal disease (HCC)     right arm AV graft, post transplant   Past Surgical History  Procedure Laterality Date  . Back surgery    . Tubal ligation  07/1999  . Dg av dialysis  graft declot or    . Nephrectomy transplanted organ    . Kidney transplant Right December 16, 2009  . Toe amputation Right     1,2 & 3rd toes.  . Cataract extraction w/ intraocular lens  implant, bilateral Bilateral   . Amputation Left 05/11/2014    Procedure: AMPUTATION LEFT GREAT TOE;  Surgeon: Kathryne Hitchhristopher Y Blackman, MD;  Location: [facility];  Service: Orthopedics;  Laterality: Left;  . Incision and drainage Right 06/02/2015    Procedure: INCISION AND DRAINAGE OF RIGHT 3rd RAY RESECTION;  Surgeon: Kathryne Hitchhristopher Y Blackman, MD;  Location: [facility] OR;  Service: Orthopedics;  Laterality: Right;  . Lumbar disc surgery  2001  . Cesarean section  07/1999  . Av fistula placement Right     forearm   Family History  Problem Relation Age of Onset  . Emphysema Mother   . Throat cancer Mother   . COPD Mother   . Cancer Mother   . Emphysema Father   . COPD Father   . Stroke Father   . ADD / ADHD Son    Social History  Substance Use Topics  . Smoking status: Former Smoker -- 1.00 packs/day for 11 years    Types: Cigarettes    Quit date: 09/21/1998  . Smokeless tobacco: Never Used  . Alcohol Use: No   OB History    No data available     Review of Systems  Constitutional: Negative for fever.  HENT:  Negative for rhinorrhea and sore throat.   Eyes: Positive for visual disturbance. Negative for redness.  Respiratory: Negative for cough.   Cardiovascular: Negative for chest pain.  Gastrointestinal: Negative for nausea, vomiting, abdominal pain and diarrhea.  Endocrine: Positive for polydipsia. Negative for polyuria.  Genitourinary: Negative for dysuria.  Musculoskeletal: Negative for myalgias and gait problem.  Skin: Negative for rash.  Neurological: Positive for headaches. Negative for speech difficulty and weakness.    Allergies  Ace inhibitors and Adhesive  Home Medications   Prior to Admission medications   Medication Sig Start Date End Date Taking? Authorizing Provider  acetaminophen  (TYLENOL) 500 MG tablet Take 500-1,000 mg by mouth every 6 (six) hours as needed for headache.    Historical Provider, MD  amLODipine (NORVASC) 10 MG tablet Take 1 tablet (10 mg total) by mouth daily. 11/06/15   Catarina Hartshornavid Tat, MD  carvedilol (COREG) 25 MG tablet Take 2 tablets (50 mg total) by mouth 2 (two) times daily. 03/04/15   Alison MurrayAlma M Devine, MD  feeding supplement, GLUCERNA SHAKE, (GLUCERNA SHAKE) LIQD Take 237 mLs by mouth 3 (three) times daily between meals. 06/04/15   Nishant Dhungel, MD  insulin NPH-regular Human (NOVOLIN 70/30) (70-30) 100 UNIT/ML injection Inject 25 Units into the skin 2 (two) times daily with a meal. 11/06/15   Catarina Hartshornavid Tat, MD  insulin regular (NOVOLIN R) 100 units/mL injection Inject 0.06 mLs (6 Units total) into the skin 3 (three) times daily before meals. 11/06/15   Catarina Hartshornavid Tat, MD  levothyroxine (SYNTHROID, LEVOTHROID) 137 MCG tablet Take 1 tablet (137 mcg total) by mouth daily. BRAND NAME ONLY Patient taking differently: Take 137 mcg by mouth every evening. BRAND NAME ONLY 03/04/15   Alison MurrayAlma M Devine, MD  mycophenolate (CELLCEPT) 250 MG capsule Take 500 mg by mouth 2 (two) times daily.     Historical Provider, MD  predniSONE (DELTASONE) 5 MG tablet Take 5 mg by mouth daily. 05/29/15   Historical Provider, MD  ranitidine (ZANTAC) 150 MG capsule Take 150 mg by mouth daily as needed for heartburn.     Historical Provider, MD  tacrolimus (PROGRAF) 1 MG capsule Take 1 mg by mouth 2 (two) times daily.     Historical Provider, MD   BP 154/80 mmHg  Pulse 77  Temp(Src) 97.8 F (36.6 C) (Oral)  Resp 18  Ht 5\' 8"  (1.727 m)  Wt 102.059 kg  BMI 34.22 kg/m2  SpO2 99%  LMP 11/19/2011   Physical Exam  Constitutional: She is oriented to person, place, and time. She appears well-developed and well-nourished.  HENT:  Head: Normocephalic and atraumatic.  Right Ear: Tympanic membrane, external ear and ear canal normal.  Left Ear: Tympanic membrane, external ear and ear canal normal.  Nose: Nose  normal.  Mouth/Throat: Uvula is midline, oropharynx is clear and moist and mucous membranes are normal.  Eyes: Conjunctivae, EOM and lids are normal. Pupils are equal, round, and reactive to light. Right eye exhibits no nystagmus. Left eye exhibits no nystagmus.  Neck: Normal range of motion. Neck supple.  Cardiovascular: Normal rate and regular rhythm.   Pulmonary/Chest: Effort normal and breath sounds normal.  Abdominal: Soft. There is no tenderness.  Musculoskeletal:       Cervical back: She exhibits normal range of motion, no tenderness and no bony tenderness.  Burn dorsal L hand, and base of index, long, and ring fingers. There is blistering suggestive of 2nd degree burn.   Neurological: She is alert and oriented to person, place,  and time. She has normal strength and normal reflexes. No cranial nerve deficit or sensory deficit. She displays a negative Romberg sign. Coordination and gait normal. GCS eye subscore is 4. GCS verbal subscore is 5. GCS motor subscore is 6.  Skin: Skin is warm and dry.  Maculopapular rash noted to the back of her neck and upper back.  Psychiatric: She has a normal mood and affect.  Nursing note and vitals reviewed.   ED Course  Procedures (including critical care time) Labs Review Labs Reviewed  COMPREHENSIVE METABOLIC PANEL - Abnormal; Notable for the following:    Glucose, Bld 268 (*)    BUN 29 (*)    Creatinine, Ser 1.26 (*)    Albumin 3.2 (*)    ALT 13 (*)    GFR calc non Af Amer 49 (*)    GFR calc Af Amer 57 (*)    All other components within normal limits  URINALYSIS, ROUTINE W REFLEX MICROSCOPIC (NOT AT [facility]) - Abnormal; Notable for the following:    Glucose, UA >1000 (*)    Protein, ur >300 (*)    All other components within normal limits  URINE MICROSCOPIC-ADD ON - Abnormal; Notable for the following:    Squamous Epithelial / LPF 6-30 (*)    All other components within normal limits  CBG MONITORING, ED - Abnormal; Notable for the  following:    Glucose-Capillary 283 (*)    All other components within normal limits  LIPASE, BLOOD  CBC    Patient seen and examined. Work-up initiated. Medications ordered.   Vital signs reviewed and are as follows: BP 154/80 mmHg  Pulse 77  Temp(Src) 97.8 F (36.6 C) (Oral)  Resp 18  Ht  (1.727 m)  Wt 102.059 kg  BMI 34.22 kg/m2  SpO2 99%  LMP 11/19/2011  8:22 PM Patient updated on results. She is feeling better. Will give second liter of fluid prior to discharge. Discussed good wound care regarding her hand burn. Discussed use of Benadryl for rash. Tylenol ordered for HA.   Patient urged to return with worsening symptoms or other concerns. Patient verbalized understanding and agrees with plan.    MDM   Final diagnoses:  Hyperglycemia  Rash  Burn   Hyperglycemia: No obvious inciting cause. No ketosis or DKA. Treated with IV fluids. Blood sugar less than 300, do not feel that insulin is indicated at this point.  Rash: Nonspecific. Started before tick bite. No fevers to suggest tickborne illness. Conservative measures indicated.  Burn: No signs of secondary infection. Patient counseled on good wound care, signs and symptoms to return.     Renne Crigler, PA-C 03/01/16 0932  Leta Baptist, MD 03/02/16 [phone]

## 2016-03-01 NOTE — ED Notes (Addendum)
BIB EMS, pt c/o BSG in 400s for the past x4 days. EMS arrived on scene to pts BSG 274. Pt also c/o H/A, dizziness, visual changes (blurry vision) and 7/10 abd pain. Pt arrives A+OX4, speaking in complete sentences.   BP 170/70 Pulse 78

## 2016-03-14 ENCOUNTER — Emergency Department (HOSPITAL_COMMUNITY)
Admission: EM | Admit: 2016-03-14 | Discharge: 2016-03-14 | Disposition: A | Discharge status: Home / Self Care | Payer: Medicare Other | Attending: Emergency Medicine | Admitting: Emergency Medicine

## 2016-03-14 ENCOUNTER — Encounter (HOSPITAL_COMMUNITY): Payer: Self-pay | Admitting: Emergency Medicine

## 2016-03-14 DIAGNOSIS — Z87891 Personal history of nicotine dependence: Secondary | ICD-10-CM | POA: Insufficient documentation

## 2016-03-14 DIAGNOSIS — N289 Disorder of kidney and ureter, unspecified: Secondary | ICD-10-CM | POA: Diagnosis not present

## 2016-03-14 DIAGNOSIS — M4690 Unspecified inflammatory spondylopathy, site unspecified: Secondary | ICD-10-CM | POA: Diagnosis not present

## 2016-03-14 DIAGNOSIS — F329 Major depressive disorder, single episode, unspecified: Secondary | ICD-10-CM | POA: Insufficient documentation

## 2016-03-14 DIAGNOSIS — E039 Hypothyroidism, unspecified: Secondary | ICD-10-CM | POA: Insufficient documentation

## 2016-03-14 DIAGNOSIS — E1165 Type 2 diabetes mellitus with hyperglycemia: Secondary | ICD-10-CM | POA: Insufficient documentation

## 2016-03-14 DIAGNOSIS — M179 Osteoarthritis of knee, unspecified: Secondary | ICD-10-CM | POA: Diagnosis not present

## 2016-03-14 DIAGNOSIS — M19029 Primary osteoarthritis, unspecified elbow: Secondary | ICD-10-CM | POA: Insufficient documentation

## 2016-03-14 DIAGNOSIS — I10 Essential (primary) hypertension: Secondary | ICD-10-CM | POA: Insufficient documentation

## 2016-03-14 DIAGNOSIS — Z79899 Other long term (current) drug therapy: Secondary | ICD-10-CM | POA: Insufficient documentation

## 2016-03-14 DIAGNOSIS — R739 Hyperglycemia, unspecified: Secondary | ICD-10-CM

## 2016-03-14 DIAGNOSIS — Z794 Long term (current) use of insulin: Secondary | ICD-10-CM | POA: Insufficient documentation

## 2016-03-14 LAB — BASIC METABOLIC PANEL
Anion gap: 13 (ref 5–15)
BUN: 38 mg/dL — AB (ref 6–20)
CHLORIDE: 104 mmol/L (ref 101–111)
CO2: 18 mmol/L — ABNORMAL LOW (ref 22–32)
CREATININE: 1.53 mg/dL — AB (ref 0.44–1.00)
Calcium: 9.7 mg/dL (ref 8.9–10.3)
GFR calc Af Amer: 45 mL/min — ABNORMAL LOW (ref >60)
GFR calc non Af Amer: 39 mL/min — ABNORMAL LOW (ref >60)
GLUCOSE: 319 mg/dL — AB (ref 65–99)
POTASSIUM: 3.7 mmol/L (ref 3.5–5.1)
Sodium: 135 mmol/L (ref 135–145)

## 2016-03-14 LAB — CBG MONITORING, ED
Glucose-Capillary: 326 mg/dL — ABNORMAL HIGH (ref 65–99)
Glucose-Capillary: 271 mg/dL — ABNORMAL HIGH (ref 65–99)

## 2016-03-14 MED ORDER — SODIUM CHLORIDE 0.9 % IV SOLN: INTRAVENOUS | Status: DC

## 2016-03-14 MED ORDER — SODIUM CHLORIDE 0.9 % IV BOLUS (SEPSIS)
2000.0000 mL | Freq: Once | INTRAVENOUS | Status: AC
  Administered 2016-03-14: 2000 mL via INTRAVENOUS

## 2016-03-14 NOTE — Discharge Instructions (Signed)
Follow-up with your Dr. next week to have a repeat creatinine performed because today it was elevated  Chronic Kidney Disease Chronic kidney disease occurs when the kidneys are damaged over a long period. The kidneys are two organs that lie on either side of the spine between the middle of the back and the front of the abdomen. The kidneys:  Remove wastes and extra water from the blood.  Produce important hormones. These help keep bones strong, regulate blood pressure, and help create red blood cells.  Balance the fluids and chemicals in the blood and tissues. A small amount of kidney damage may not cause problems, but a large amount of damage may make it difficult or impossible for the kidneys to work the way they should. If steps are not taken to slow down the kidney damage or stop it from getting worse, the kidneys may stop working permanently. Most of the time, chronic kidney disease does not go away. However, it can often be controlled, and those with the disease can usually live normal lives. CAUSES The most common causes of chronic kidney disease are diabetes and high blood pressure (hypertension). Chronic kidney disease may also be caused by:  Diseases that cause the kidneys' filters to become inflamed.  Diseases that affect the immune system.  Genetic diseases.  Medicines that damage the kidneys, such as anti-inflammatory medicines.  Poisoning or exposure to toxic substances.  A reoccurring kidney or urinary infection.  A problem with urine flow. This may be caused by:  Cancer.  Kidney stones.  An enlarged prostate in males. SIGNS AND SYMPTOMS Because the kidney damage in chronic kidney disease occurs slowly, symptoms develop slowly and may not be obvious until the kidney damage becomes severe. A person may have a kidney disease for years without showing any symptoms. Symptoms can include:  Swelling (edema) of the legs, ankles, or feet.  Tiredness (lethargy).  Nausea  or vomiting.  Confusion.  Problems with urination, such as:  Decreased urine production.  Frequent urination, especially at night.  Frequent accidents in children who are potty trained.  Muscle twitches and cramps.  Shortness of breath.  Weakness.  Persistent itchiness.  Loss of appetite.  Metallic taste in the mouth.  Trouble sleeping.  Slowed development in children.  Short stature in children. DIAGNOSIS Chronic kidney disease may be detected and diagnosed by tests, including blood, urine, imaging, or kidney biopsy tests. TREATMENT Most chronic kidney diseases cannot be cured. Treatment usually involves relieving symptoms and preventing or slowing the progression of the disease. Treatment may include:  A special diet. You may need to avoid alcohol and foods thatare salty and high in potassium.  Medicines. These may:  Lower blood pressure.  Relieve anemia.  Relieve swelling.  Protect the bones. HOME CARE INSTRUCTIONS  Follow your prescribed diet. Your health care provider may instruct you to limit daily salt (sodium) and protein intake.  Take medicines only as directed by your health care provider. Do not take any new medicines (prescription, over-the-counter, or nutritional supplements) unless approved by your health care provider. Many medicines can worsen your kidney damage or need to have the dose adjusted.   Quit smoking if you smoke. Talk to your health care provider about a smoking cessation program.  Keep all follow-up visits as directed by your health care provider.  Monitor your blood pressure.  Start or continue an exercise plan.  Get immunizations as directed by your health care provider.  Take vitamin and mineral supplements as directed  by your health care provider. SEEK IMMEDIATE MEDICAL CARE IF:  Your symptoms get worse or you develop new symptoms.  You develop symptoms of end-stage kidney disease. These  include:  Headaches.  Abnormally dark or light skin.  Numbness in the hands or feet.  Easy bruising.  Frequent hiccups.  Menstruation stops.  You have a fever.  You have decreased urine production.  You havepain or bleeding when urinating. MAKE SURE YOU:  Understand these instructions.  Will watch your condition.  Will get help right away if you are not doing well or get worse. FOR MORE INFORMATION   American Association of Kidney Patients: ResidentialShow.is  National Kidney Foundation: www.kidney.org  American Kidney Fund: FightingMatch.com.ee  [employer] [facility]: www.lifeoptions.org and www.kidneyschool.org   This information is not intended to replace advice given to you by your health care provider. Make sure you discuss any questions you have with your health care provider.   Document Released: 06/02/2008 Document Revised: 09/14/2014 Document Reviewed: 04/22/2012 Elsevier Interactive Patient Education 2016 Elsevier Inc. Hyperglycemia Hyperglycemia occurs when the glucose (sugar) in your blood is too high. Hyperglycemia can happen for many reasons, but it most often happens to people who do not know they have diabetes or are not managing their diabetes properly.  CAUSES  Whether you have diabetes or not, there are other causes of hyperglycemia. Hyperglycemia can occur when you have diabetes, but it can also occur in other situations that you might not be as aware of, such as: Diabetes  If you have diabetes and are having problems controlling your blood glucose, hyperglycemia could occur because of some of the following reasons:  Not following your meal plan.  Not taking your diabetes medications or not taking it properly.  Exercising less or doing less activity than you normally do.  Being sick. Pre-diabetes  This cannot be ignored. Before people develop Type 2 diabetes, they almost always have "pre-diabetes." This is when your blood glucose levels  are higher than normal, but not yet high enough to be diagnosed as diabetes. Research has shown that some long-term damage to the body, especially the heart and circulatory system, may already be occurring during pre-diabetes. If you take action to manage your blood glucose when you have pre-diabetes, you may delay or prevent Type 2 diabetes from developing. Stress  If you have diabetes, you may be "diet" controlled or on oral medications or insulin to control your diabetes. However, you may find that your blood glucose is higher than usual in the hospital whether you have diabetes or not. This is often referred to as "stress hyperglycemia." Stress can elevate your blood glucose. This happens because of hormones put out by the body during times of stress. If stress has been the cause of your high blood glucose, it can be followed regularly by your caregiver. That way he/she can make sure your hyperglycemia does not continue to get worse or progress to diabetes. Steroids  Steroids are medications that act on the infection fighting system (immune system) to block inflammation or infection. One side effect can be a rise in blood glucose. Most people can produce enough extra insulin to allow for this rise, but for those who cannot, steroids make blood glucose levels go even higher. It is not unusual for steroid treatments to "uncover" diabetes that is developing. It is not always possible to determine if the hyperglycemia will go away after the steroids are stopped. A special blood test called an A1c is sometimes done to  determine if your blood glucose was elevated before the steroids were started. SYMPTOMS  Thirsty.  Frequent urination.  Dry mouth.  Blurred vision.  Tired or fatigue.  Weakness.  Sleepy.  Tingling in feet or leg. DIAGNOSIS  Diagnosis is made by monitoring blood glucose in one or all of the following ways:  A1c test. This is a chemical found in your blood.  Fingerstick blood  glucose monitoring.  Laboratory results. TREATMENT  First, knowing the cause of the hyperglycemia is important before the hyperglycemia can be treated. Treatment may include, but is not be limited to:  Education.  Change or adjustment in medications.  Change or adjustment in meal plan.  Treatment for an illness, infection, etc.  More frequent blood glucose monitoring.  Change in exercise plan.  Decreasing or stopping steroids.  Lifestyle changes. HOME CARE INSTRUCTIONS   Test your blood glucose as directed.  Exercise regularly. Your caregiver will give you instructions about exercise. Pre-diabetes or diabetes which comes on with stress is helped by exercising.  Eat wholesome, balanced meals. Eat often and at regular, fixed times. Your caregiver or nutritionist will give you a meal plan to guide your sugar intake.  Being at an ideal weight is important. If needed, losing as little as 10 to 15 pounds may help improve blood glucose levels. SEEK MEDICAL CARE IF:   You have questions about medicine, activity, or diet.  You continue to have symptoms (problems such as increased thirst, urination, or weight gain). SEEK IMMEDIATE MEDICAL CARE IF:   You are vomiting or have diarrhea.  Your breath smells fruity.  You are breathing faster or slower.  You are very sleepy or incoherent.  You have numbness, tingling, or pain in your feet or hands.  You have chest pain.  Your symptoms get worse even though you have been following your caregiver's orders.  If you have any other questions or concerns.   This information is not intended to replace advice given to you by your health care provider. Make sure you discuss any questions you have with your health care provider.   Document Released: 02/17/2001 Document Revised: 11/16/2011 Document Reviewed: 04/30/2015 Elsevier Interactive Patient Education [employer]2016 Elsevier Inc.

## 2016-03-14 NOTE — ED Notes (Signed)
Bed: WA07 Expected date:  Expected time:  Means of arrival:  Comments: EMS 50yo F elevated blood sugar [place] the bus stop

## 2016-03-14 NOTE — ED Provider Notes (Signed)
CSN: [id number]     Arrival date & time 03/14/16  2037 History   First MD Initiated Contact with Patient 03/14/16 2059     Chief Complaint  Patient presents with  . Hyperglycemia     (Consider location/radiation/quality/duration/timing/severity/associated sxs/prior Treatment) HPI Comments: Patient here complaining of increased lower extremity weakness that began suddenly. She admits that she drank 3 beers this evening and did not have any food. Her blood sugar was elevated called EMS and was 362. Denies any polyuria or polydipsia. Has been compliant with her diabetic medications. Denies any focality to her symptoms. Feels better at this time. Called EMS was transported here.  Patient is a 50 y.o. female presenting with hyperglycemia. The history is provided by the patient.  Hyperglycemia   Past Medical History  Diagnosis Date  . Hypertension   . Hypothyroid   . 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 Pneumonia ~ 2007?  [name] Type II diabetes mellitus (HCC)     Insulin dependant  . Daily headache   . Arthritis     "elbows, knees, legs, back" (09/24/2015)  . Depression     "years ago"  . Kidney disease   . End stage renal disease (HCC)     right arm AV graft, post transplant   Past Surgical History  Procedure Laterality Date  . Back surgery    . Tubal ligation  07/1999  . Dg av dialysis graft declot or    . Nephrectomy transplanted organ    . Kidney transplant Right December 16, 2009  . Toe amputation Right     1,2 & 3rd toes.  . Cataract extraction w/ intraocular lens  implant, bilateral Bilateral   . Amputation Left 05/11/2014    Procedure: AMPUTATION LEFT GREAT TOE;  Surgeon: Kathryne Hitch, MD;  Location: [facility];  Service: Orthopedics;  Laterality: Left;  . Incision and drainage Right 06/02/2015    Procedure: INCISION AND DRAINAGE OF RIGHT 3rd RAY RESECTION;  Surgeon: Kathryne Hitch, MD;  Location: [facility];  Service: Orthopedics;  Laterality:  Right;  . Lumbar disc surgery  2001  . Cesarean section  07/1999  . Av fistula placement Right     forearm   Family History  Problem Relation Age of Onset  . Emphysema Mother   . Throat cancer Mother   . COPD Mother   . Cancer Mother   . Emphysema Father   . COPD Father   . Stroke Father   . ADD / ADHD Son    Social History  Substance Use Topics  . Smoking status: Former Smoker -- 1.00 packs/day for 11 years    Types: Cigarettes    Quit date: 09/21/1998  . Smokeless tobacco: Never Used  . Alcohol Use: No   OB History    No data available     Review of Systems  All other systems reviewed and are negative.     Allergies  Ace inhibitors and Adhesive  Home Medications   Prior to Admission medications   Medication Sig Start Date End Date Taking? Authorizing Provider  acetaminophen (TYLENOL) 500 MG tablet Take 500-1,000 mg by mouth every 6 (six) hours as needed for headache.    Historical Provider, MD  amLODipine (NORVASC) 10 MG tablet Take 1 tablet (10 mg total) by mouth daily. 11/06/15   Catarina Hartshorn, MD  carvedilol (COREG) 25 MG tablet Take 2 tablets (50 mg total) by mouth 2 (two) times daily. 03/04/15  Alison MurrayAlma M Devine, MD  feeding supplement, GLUCERNA SHAKE, (GLUCERNA SHAKE) LIQD Take 237 mLs by mouth 3 (three) times daily between meals. 06/04/15   Nishant Dhungel, MD  insulin NPH-regular Human (NOVOLIN 70/30) (70-30) 100 UNIT/ML injection Inject 25 Units into the skin 2 (two) times daily with a meal. 11/06/15   Catarina Hartshornavid Tat, MD  insulin regular (NOVOLIN R) 100 units/mL injection Inject 0.06 mLs (6 Units total) into the skin 3 (three) times daily before meals. 11/06/15   Catarina Hartshornavid Tat, MD  levothyroxine (SYNTHROID, LEVOTHROID) 137 MCG tablet Take 1 tablet (137 mcg total) by mouth daily. BRAND NAME ONLY Patient taking differently: Take 137 mcg by mouth every evening. BRAND NAME ONLY 03/04/15   Alison MurrayAlma M Devine, MD  mycophenolate (CELLCEPT) 250 MG capsule Take 500 mg by mouth 2 (two) times  daily.     Historical Provider, MD  predniSONE (DELTASONE) 5 MG tablet Take 5 mg by mouth daily. 05/29/15   Historical Provider, MD  ranitidine (ZANTAC) 150 MG capsule Take 150 mg by mouth daily as needed for heartburn.     Historical Provider, MD  tacrolimus (PROGRAF) 1 MG capsule Take 1 mg by mouth 2 (two) times daily.     Historical Provider, MD   BP 151/75 mmHg  Pulse 84  Temp(Src) 97.7 F (36.5 C) (Oral)  Resp 18  SpO2 97%  LMP 11/19/2011 Physical Exam  Constitutional: She is oriented to person, place, and time. She appears well-developed and well-nourished.  Non-toxic appearance. No distress.  HENT:  Head: Normocephalic and atraumatic.  Eyes: Conjunctivae, EOM and lids are normal. Pupils are equal, round, and reactive to light.  Neck: Normal range of motion. Neck supple. No tracheal deviation present. No thyroid mass present.  Cardiovascular: Normal rate, regular rhythm and normal heart sounds.  Exam reveals no gallop.   No murmur heard. Pulmonary/Chest: Effort normal and breath sounds normal. No stridor. No respiratory distress. She has no decreased breath sounds. She has no wheezes. She has no rhonchi. She has no rales.  Abdominal: Soft. Normal appearance and bowel sounds are normal. She exhibits no distension. There is no tenderness. There is no rebound and no CVA tenderness.  Musculoskeletal: Normal range of motion. She exhibits no edema or tenderness.  Neurological: She is alert and oriented to person, place, and time. She has normal strength. No cranial nerve deficit or sensory deficit. GCS eye subscore is 4. GCS verbal subscore is 5. GCS motor subscore is 6.  Skin: Skin is warm and dry. No abrasion and no rash noted.  Psychiatric: She has a normal mood and affect. Her speech is normal and behavior is normal.  Nursing note and vitals reviewed.   ED Course  Procedures (including critical care time) Labs Review Labs Reviewed  BASIC METABOLIC PANEL  CBG MONITORING, ED     Imaging Review No results found. I have personally reviewed and evaluated these images and lab results as part of my medical decision-making.   EKG Interpretation None      MDM   Final diagnoses:  None    Patient given 2 L of IV saline here and repeat CBG has improved. Patient has a known history of renal insufficiency and she was instructed to follow-up with her Dr. for this.    Lorre NickAnthony Devonte Migues, MD 03/14/16 [phone]

## 2016-03-14 NOTE — ED Notes (Signed)
Per EMS, pt. Was picked up from bus stop with compliant of hyperglycemia, cbg of 362mg /dl., pt stated that she was feeling weak while waiting bus for home, knows her sugar was high. Taking her diabetic meds regularly. Denies any pain.

## 2016-03-27 ENCOUNTER — Emergency Department (HOSPITAL_COMMUNITY): Payer: Medicare Other

## 2016-03-27 ENCOUNTER — Inpatient Hospital Stay (HOSPITAL_COMMUNITY)
Admission: EM | Admit: 2016-03-27 | Discharge: 2016-03-31 | DRG: 637 | Disposition: A | Discharge status: Home Health | Payer: Medicare Other | Attending: Emergency Medicine | Admitting: Emergency Medicine

## 2016-03-27 ENCOUNTER — Encounter (HOSPITAL_COMMUNITY): Payer: Self-pay | Admitting: Emergency Medicine

## 2016-03-27 DIAGNOSIS — I4510 Unspecified right bundle-branch block: Secondary | ICD-10-CM | POA: Diagnosis present

## 2016-03-27 DIAGNOSIS — R112 Nausea with vomiting, unspecified: Secondary | ICD-10-CM | POA: Diagnosis present

## 2016-03-27 DIAGNOSIS — Z9841 Cataract extraction status, right eye: Secondary | ICD-10-CM | POA: Diagnosis not present

## 2016-03-27 DIAGNOSIS — Z6834 Body mass index (BMI) 34.0-34.9, adult: Secondary | ICD-10-CM

## 2016-03-27 DIAGNOSIS — E1010 Type 1 diabetes mellitus with ketoacidosis without coma: Principal | ICD-10-CM | POA: Diagnosis present

## 2016-03-27 DIAGNOSIS — D899 Disorder involving the immune mechanism, unspecified: Secondary | ICD-10-CM

## 2016-03-27 DIAGNOSIS — R571 Hypovolemic shock: Secondary | ICD-10-CM | POA: Diagnosis present

## 2016-03-27 DIAGNOSIS — E1022 Type 1 diabetes mellitus with diabetic chronic kidney disease: Secondary | ICD-10-CM | POA: Diagnosis present

## 2016-03-27 DIAGNOSIS — Z79899 Other long term (current) drug therapy: Secondary | ICD-10-CM

## 2016-03-27 DIAGNOSIS — R21 Rash and other nonspecific skin eruption: Secondary | ICD-10-CM | POA: Diagnosis present

## 2016-03-27 DIAGNOSIS — Z808 Family history of malignant neoplasm of other organs or systems: Secondary | ICD-10-CM

## 2016-03-27 DIAGNOSIS — E039 Hypothyroidism, unspecified: Secondary | ICD-10-CM | POA: Diagnosis present

## 2016-03-27 DIAGNOSIS — I10 Essential (primary) hypertension: Secondary | ICD-10-CM | POA: Diagnosis not present

## 2016-03-27 DIAGNOSIS — F329 Major depressive disorder, single episode, unspecified: Secondary | ICD-10-CM | POA: Diagnosis present

## 2016-03-27 DIAGNOSIS — M1990 Unspecified osteoarthritis, unspecified site: Secondary | ICD-10-CM | POA: Diagnosis present

## 2016-03-27 DIAGNOSIS — Z961 Presence of intraocular lens: Secondary | ICD-10-CM | POA: Diagnosis present

## 2016-03-27 DIAGNOSIS — Z7952 Long term (current) use of systemic steroids: Secondary | ICD-10-CM

## 2016-03-27 DIAGNOSIS — Z9842 Cataract extraction status, left eye: Secondary | ICD-10-CM

## 2016-03-27 DIAGNOSIS — L308 Other specified dermatitis: Secondary | ICD-10-CM | POA: Diagnosis present

## 2016-03-27 DIAGNOSIS — N179 Acute kidney failure, unspecified: Secondary | ICD-10-CM | POA: Diagnosis present

## 2016-03-27 DIAGNOSIS — R579 Shock, unspecified: Secondary | ICD-10-CM

## 2016-03-27 DIAGNOSIS — N19 Unspecified kidney failure: Secondary | ICD-10-CM

## 2016-03-27 DIAGNOSIS — E875 Hyperkalemia: Secondary | ICD-10-CM | POA: Diagnosis present

## 2016-03-27 DIAGNOSIS — Z825 Family history of asthma and other chronic lower respiratory diseases: Secondary | ICD-10-CM

## 2016-03-27 DIAGNOSIS — I120 Hypertensive chronic kidney disease with stage 5 chronic kidney disease or end stage renal disease: Secondary | ICD-10-CM | POA: Diagnosis present

## 2016-03-27 DIAGNOSIS — B372 Candidiasis of skin and nail: Secondary | ICD-10-CM | POA: Diagnosis present

## 2016-03-27 DIAGNOSIS — E1110 Type 2 diabetes mellitus with ketoacidosis without coma: Secondary | ICD-10-CM | POA: Diagnosis present

## 2016-03-27 DIAGNOSIS — E876 Hypokalemia: Secondary | ICD-10-CM | POA: Diagnosis present

## 2016-03-27 DIAGNOSIS — N183 Chronic kidney disease, stage 3 (moderate): Secondary | ICD-10-CM | POA: Diagnosis present

## 2016-03-27 DIAGNOSIS — Z794 Long term (current) use of insulin: Secondary | ICD-10-CM

## 2016-03-27 DIAGNOSIS — Z9109 Other allergy status, other than to drugs and biological substances: Secondary | ICD-10-CM

## 2016-03-27 DIAGNOSIS — R6510 Systemic inflammatory response syndrome (SIRS) of non-infectious origin without acute organ dysfunction: Secondary | ICD-10-CM | POA: Diagnosis present

## 2016-03-27 DIAGNOSIS — Z8701 Personal history of pneumonia (recurrent): Secondary | ICD-10-CM

## 2016-03-27 DIAGNOSIS — Z940 Kidney transplant status: Secondary | ICD-10-CM

## 2016-03-27 DIAGNOSIS — Z823 Family history of stroke: Secondary | ICD-10-CM

## 2016-03-27 DIAGNOSIS — Z7982 Long term (current) use of aspirin: Secondary | ICD-10-CM

## 2016-03-27 DIAGNOSIS — Z87891 Personal history of nicotine dependence: Secondary | ICD-10-CM

## 2016-03-27 DIAGNOSIS — Z888 Allergy status to other drugs, medicaments and biological substances status: Secondary | ICD-10-CM

## 2016-03-27 DIAGNOSIS — D849 Immunodeficiency, unspecified: Secondary | ICD-10-CM

## 2016-03-27 LAB — BLOOD GAS, VENOUS
ACID-BASE DEFICIT: 22.8 mmol/L — AB (ref 0.0–2.0)
Bicarbonate: 5.7 mEq/L — ABNORMAL LOW (ref 20.0–24.0)
O2 Saturation: 94.2 %
PCO2 VEN: 19 mmHg — AB (ref 45.0–50.0)
PH VEN: 7.108 — AB (ref 7.250–7.300)
PO2 VEN: 99.3 mmHg — AB (ref 31.0–45.0)
Patient temperature: 98.6
TCO2: 5.6 mmol/L (ref 0–100)

## 2016-03-27 LAB — BASIC METABOLIC PANEL
ANION GAP: 15 (ref 5–15)
Anion gap: 9 (ref 5–15)
BUN: 45 mg/dL — ABNORMAL HIGH (ref 6–20)
BUN: 43 mg/dL — AB (ref 6–20)
BUN: 39 mg/dL — AB (ref 6–20)
CHLORIDE: 94 mmol/L — AB (ref 101–111)
CO2: 14 mmol/L — ABNORMAL LOW (ref 22–32)
CO2: 19 mmol/L — ABNORMAL LOW (ref 22–32)
Calcium: 9.5 mg/dL (ref 8.9–10.3)
Calcium: 8.4 mg/dL — ABNORMAL LOW (ref 8.9–10.3)
Calcium: 8.3 mg/dL — ABNORMAL LOW (ref 8.9–10.3)
Chloride: 109 mmol/L (ref 101–111)
Chloride: 111 mmol/L (ref 101–111)
Creatinine, Ser: 2.46 mg/dL — ABNORMAL HIGH (ref 0.44–1.00)
Creatinine, Ser: 1.93 mg/dL — ABNORMAL HIGH (ref 0.44–1.00)
Creatinine, Ser: 1.71 mg/dL — ABNORMAL HIGH (ref 0.44–1.00)
GFR calc Af Amer: 25 mL/min — ABNORMAL LOW (ref >60)
GFR calc non Af Amer: 22 mL/min — ABNORMAL LOW (ref >60)
GFR, EST AFRICAN AMERICAN: 34 mL/min — AB (ref >60)
GFR, EST AFRICAN AMERICAN: 39 mL/min — AB (ref >60)
GFR, EST NON AFRICAN AMERICAN: 29 mL/min — AB (ref >60)
GFR, EST NON AFRICAN AMERICAN: 34 mL/min — AB (ref >60)
GLUCOSE: 827 mg/dL — AB (ref 65–99)
Glucose, Bld: 331 mg/dL — ABNORMAL HIGH (ref 65–99)
Glucose, Bld: 203 mg/dL — ABNORMAL HIGH (ref 65–99)
POTASSIUM: 5.9 mmol/L — AB (ref 3.5–5.1)
POTASSIUM: 4.1 mmol/L (ref 3.5–5.1)
POTASSIUM: 3.4 mmol/L — AB (ref 3.5–5.1)
SODIUM: 138 mmol/L (ref 135–145)
SODIUM: 139 mmol/L (ref 135–145)
Sodium: 130 mmol/L — ABNORMAL LOW (ref 135–145)

## 2016-03-27 LAB — URINE MICROSCOPIC-ADD ON

## 2016-03-27 LAB — URINALYSIS, ROUTINE W REFLEX MICROSCOPIC
HGB URINE DIPSTICK: NEGATIVE
Ketones, ur: >80 mg/dL — AB
Leukocytes, UA: NEGATIVE
NITRITE: NEGATIVE
PH: 5 (ref 5.0–8.0)
Protein, ur: 100 mg/dL — AB
SPECIFIC GRAVITY, URINE: 1.026 (ref 1.005–1.030)

## 2016-03-27 LAB — LACTIC ACID, PLASMA
Lactic Acid, Venous: 2.1 mmol/L (ref 0.5–1.9)
Lactic Acid, Venous: 2.4 mmol/L (ref 0.5–1.9)

## 2016-03-27 LAB — CBC
HEMATOCRIT: 40.5 % (ref 36.0–46.0)
HEMATOCRIT: 35.9 % — AB (ref 36.0–46.0)
HEMOGLOBIN: 12 g/dL (ref 12.0–15.0)
Hemoglobin: 11.1 g/dL — ABNORMAL LOW (ref 12.0–15.0)
MCH: 28.4 pg (ref 26.0–34.0)
MCH: 27.9 pg (ref 26.0–34.0)
MCHC: 29.6 g/dL — AB (ref 30.0–36.0)
MCHC: 30.9 g/dL (ref 30.0–36.0)
MCV: 95.7 fL (ref 78.0–100.0)
MCV: 90.2 fL (ref 78.0–100.0)
PLATELETS: 190 10*3/uL (ref 150–400)
Platelets: 230 10*3/uL (ref 150–400)
RBC: 4.23 MIL/uL (ref 3.87–5.11)
RBC: 3.98 MIL/uL (ref 3.87–5.11)
RDW: 14.1 % (ref 11.5–15.5)
RDW: 13.9 % (ref 11.5–15.5)
WBC: 12.5 10*3/uL — ABNORMAL HIGH (ref 4.0–10.5)
WBC: 2.4 10*3/uL — AB (ref 4.0–10.5)

## 2016-03-27 LAB — GLUCOSE, CAPILLARY
GLUCOSE-CAPILLARY: 364 mg/dL — AB (ref 65–99)
GLUCOSE-CAPILLARY: 297 mg/dL — AB (ref 65–99)
GLUCOSE-CAPILLARY: 223 mg/dL — AB (ref 65–99)
GLUCOSE-CAPILLARY: 196 mg/dL — AB (ref 65–99)
GLUCOSE-CAPILLARY: 176 mg/dL — AB (ref 65–99)
Glucose-Capillary: 186 mg/dL — ABNORMAL HIGH (ref 65–99)
Glucose-Capillary: 239 mg/dL — ABNORMAL HIGH (ref 65–99)

## 2016-03-27 LAB — I-STAT CHEM 8, ED
BUN: 40 mg/dL — ABNORMAL HIGH (ref 6–20)
CHLORIDE: 100 mmol/L — AB (ref 101–111)
Calcium, Ion: 1.31 mmol/L — ABNORMAL HIGH (ref 1.13–1.30)
Creatinine, Ser: 1.9 mg/dL — ABNORMAL HIGH (ref 0.44–1.00)
HCT: 40 % (ref 36.0–46.0)
Hemoglobin: 13.6 g/dL (ref 12.0–15.0)
POTASSIUM: 5.8 mmol/L — AB (ref 3.5–5.1)
SODIUM: 130 mmol/L — AB (ref 135–145)
TCO2: 9 mmol/L (ref 0–100)

## 2016-03-27 LAB — CBG MONITORING, ED
GLUCOSE-CAPILLARY: 552 mg/dL — AB (ref 65–99)
GLUCOSE-CAPILLARY: 494 mg/dL — AB (ref 65–99)
Glucose-Capillary: >600 mg/dL (ref 65–99)
Glucose-Capillary: >600 mg/dL (ref 65–99)
Glucose-Capillary: 402 mg/dL — ABNORMAL HIGH (ref 65–99)

## 2016-03-27 LAB — MRSA PCR SCREENING: MRSA by PCR: POSITIVE — AB

## 2016-03-27 LAB — LIPASE, BLOOD: LIPASE: 101 U/L — AB (ref 11–51)

## 2016-03-27 LAB — TSH: TSH: 1.898 u[IU]/mL (ref 0.350–4.500)

## 2016-03-27 LAB — BETA-HYDROXYBUTYRIC ACID: Beta-Hydroxybutyric Acid: 5.37 mmol/L — ABNORMAL HIGH (ref 0.05–0.27)

## 2016-03-27 LAB — MAGNESIUM: MAGNESIUM: 1.4 mg/dL — AB (ref 1.7–2.4)

## 2016-03-27 LAB — TROPONIN I: TROPONIN I: 0.05 ng/mL — AB (ref <0.03)

## 2016-03-27 LAB — PHOSPHORUS: PHOSPHORUS: 4 mg/dL (ref 2.5–4.6)

## 2016-03-27 MED ORDER — LEVOTHYROXINE SODIUM 25 MCG PO TABS
137.0000 ug | ORAL_TABLET | Freq: Every day | ORAL | Status: DC
  Administered 2016-03-28 – 2016-03-31 (×4): 137 ug via ORAL
  Filled 2016-03-27 (×5): qty 1

## 2016-03-27 MED ORDER — DEXTROSE-NACL 5-0.45 % IV SOLN
INTRAVENOUS | Status: DC
  Administered 2016-03-27 – 2016-03-28 (×2): via INTRAVENOUS

## 2016-03-27 MED ORDER — PIPERACILLIN-TAZOBACTAM 3.375 G IVPB
3.3750 g | Freq: Three times a day (TID) | INTRAVENOUS | Status: DC
  Administered 2016-03-27 – 2016-03-28 (×2): 3.375 g via INTRAVENOUS
  Filled 2016-03-27 (×2): qty 50

## 2016-03-27 MED ORDER — FLUCONAZOLE 100 MG PO TABS
100.0000 mg | ORAL_TABLET | Freq: Every day | ORAL | Status: DC
  Administered 2016-03-27 – 2016-03-29 (×3): 100 mg via ORAL
  Filled 2016-03-27 (×3): qty 1

## 2016-03-27 MED ORDER — HYDROCORTISONE NA SUCCINATE PF 100 MG IJ SOLR
50.0000 mg | Freq: Four times a day (QID) | INTRAMUSCULAR | Status: DC
  Administered 2016-03-27 – 2016-03-28 (×3): 50 mg via INTRAVENOUS
  Filled 2016-03-27 (×3): qty 2

## 2016-03-27 MED ORDER — TACROLIMUS 1 MG PO CAPS
1.0000 mg | ORAL_CAPSULE | Freq: Two times a day (BID) | ORAL | Status: DC
  Administered 2016-03-27 – 2016-03-31 (×8): 1 mg via ORAL
  Filled 2016-03-27 (×9): qty 1

## 2016-03-27 MED ORDER — ONDANSETRON HCL 4 MG/2ML IJ SOLN
4.0000 mg | Freq: Four times a day (QID) | INTRAMUSCULAR | Status: DC | PRN
  Administered 2016-03-27: 4 mg via INTRAVENOUS
  Filled 2016-03-27: qty 2

## 2016-03-27 MED ORDER — SODIUM CHLORIDE 0.9 % IV SOLN
INTRAVENOUS | Status: DC
  Administered 2016-03-27: 12:00:00 via INTRAVENOUS

## 2016-03-27 MED ORDER — CHLORHEXIDINE GLUCONATE CLOTH 2 % EX PADS
6.0000 | MEDICATED_PAD | Freq: Every day | CUTANEOUS | Status: DC
  Administered 2016-03-28 – 2016-03-31 (×4): 6 via TOPICAL

## 2016-03-27 MED ORDER — ASPIRIN EC 81 MG PO TBEC
81.0000 mg | DELAYED_RELEASE_TABLET | Freq: Every day | ORAL | Status: DC
  Administered 2016-03-27 – 2016-03-31 (×5): 81 mg via ORAL
  Filled 2016-03-27 (×5): qty 1

## 2016-03-27 MED ORDER — DEXTROSE-NACL 5-0.45 % IV SOLN: INTRAVENOUS | Status: DC

## 2016-03-27 MED ORDER — PIPERACILLIN-TAZOBACTAM 3.375 G IVPB
3.3750 g | Freq: Once | INTRAVENOUS | Status: AC
  Administered 2016-03-27: 3.375 g via INTRAVENOUS
  Filled 2016-03-27: qty 50

## 2016-03-27 MED ORDER — ONDANSETRON HCL 4 MG/2ML IJ SOLN
4.0000 mg | Freq: Once | INTRAMUSCULAR | Status: AC
  Administered 2016-03-27: 4 mg via INTRAVENOUS
  Filled 2016-03-27: qty 2

## 2016-03-27 MED ORDER — HEPARIN SODIUM (PORCINE) 5000 UNIT/ML IJ SOLN
5000.0000 [IU] | Freq: Three times a day (TID) | INTRAMUSCULAR | Status: DC
  Administered 2016-03-27 – 2016-03-30 (×11): 5000 [IU] via SUBCUTANEOUS
  Filled 2016-03-27 (×12): qty 1

## 2016-03-27 MED ORDER — SODIUM CHLORIDE 0.9 % IV BOLUS (SEPSIS)
1000.0000 mL | Freq: Once | INTRAVENOUS | Status: AC
  Administered 2016-03-27: 1000 mL via INTRAVENOUS

## 2016-03-27 MED ORDER — SODIUM CHLORIDE 0.9 % IV SOLN
INTRAVENOUS | Status: DC
  Filled 2016-03-27: qty 2.5

## 2016-03-27 MED ORDER — SODIUM BICARBONATE 8.4 % IV SOLN
INTRAVENOUS | Status: DC
  Administered 2016-03-27: 16:00:00 via INTRAVENOUS
  Filled 2016-03-27: qty 150

## 2016-03-27 MED ORDER — POTASSIUM CHLORIDE 10 MEQ/50ML IV SOLN
10.0000 meq | INTRAVENOUS | Status: AC
  Administered 2016-03-27 – 2016-03-28 (×4): 10 meq via INTRAVENOUS
  Filled 2016-03-27 (×4): qty 50

## 2016-03-27 MED ORDER — SODIUM CHLORIDE 0.9 % IV BOLUS (SEPSIS)
2000.0000 mL | Freq: Once | INTRAVENOUS | Status: AC
  Administered 2016-03-27: 2000 mL via INTRAVENOUS

## 2016-03-27 MED ORDER — INSULIN ASPART 100 UNIT/ML ~~LOC~~ SOLN
10.0000 [IU] | Freq: Once | SUBCUTANEOUS | Status: AC
  Administered 2016-03-27: 10 [IU] via INTRAVENOUS
  Filled 2016-03-27: qty 1

## 2016-03-27 MED ORDER — SODIUM CHLORIDE 0.9 % IV SOLN: INTRAVENOUS | Status: AC

## 2016-03-27 MED ORDER — MUPIROCIN 2 % EX OINT
TOPICAL_OINTMENT | Freq: Two times a day (BID) | CUTANEOUS | Status: DC
  Administered 2016-03-27 – 2016-03-29 (×5): via NASAL
  Administered 2016-03-30: 1 via NASAL
  Administered 2016-03-30: 11:00:00 via NASAL
  Administered 2016-03-31: 1 via NASAL
  Filled 2016-03-27 (×3): qty 22

## 2016-03-27 MED ORDER — SODIUM CHLORIDE 0.9 % IV SOLN
INTRAVENOUS | Status: DC
  Administered 2016-03-27: 18:00:00 via INTRAVENOUS

## 2016-03-27 MED ORDER — INSULIN REGULAR HUMAN 100 UNIT/ML IJ SOLN
INTRAMUSCULAR | Status: DC
  Administered 2016-03-27: 5.4 [IU]/h via INTRAVENOUS
  Filled 2016-03-27: qty 2.5

## 2016-03-27 MED ORDER — VANCOMYCIN HCL IN DEXTROSE 1-5 GM/200ML-% IV SOLN
1000.0000 mg | Freq: Once | INTRAVENOUS | Status: AC
  Administered 2016-03-27: 1000 mg via INTRAVENOUS
  Filled 2016-03-27: qty 200

## 2016-03-27 MED ORDER — MYCOPHENOLATE MOFETIL 250 MG PO CAPS
500.0000 mg | ORAL_CAPSULE | Freq: Two times a day (BID) | ORAL | Status: DC
  Administered 2016-03-27 – 2016-03-31 (×8): 500 mg via ORAL
  Filled 2016-03-27 (×9): qty 2

## 2016-03-27 MED ORDER — NYSTATIN 100000 UNIT/GM EX CREA
TOPICAL_CREAM | Freq: Two times a day (BID) | CUTANEOUS | Status: DC
  Administered 2016-03-27 – 2016-03-29 (×5): via TOPICAL
  Administered 2016-03-30: 1 via TOPICAL
  Administered 2016-03-30 – 2016-03-31 (×2): via TOPICAL
  Filled 2016-03-27 (×2): qty 15

## 2016-03-27 MED ORDER — ASPIRIN 325 MG PO TABS
325.0000 mg | ORAL_TABLET | Freq: Once | ORAL | Status: AC
  Administered 2016-03-27: 325 mg via ORAL
  Filled 2016-03-27: qty 1

## 2016-03-27 MED ORDER — NOREPINEPHRINE BITARTRATE 1 MG/ML IV SOLN
2.0000 ug/min | INTRAVENOUS | Status: DC
  Administered 2016-03-27: 2 ug/min via INTRAVENOUS
  Filled 2016-03-27: qty 4

## 2016-03-27 MED ORDER — SODIUM BICARBONATE 8.4 % IV SOLN
INTRAVENOUS | Status: DC
  Administered 2016-03-27: 16:00:00 via INTRAVENOUS

## 2016-03-27 MED ORDER — SODIUM CHLORIDE 0.9 % IV SOLN: 1250.0000 mg | INTRAVENOUS | Status: DC

## 2016-03-27 MED ORDER — FAMOTIDINE 20 MG PO TABS
20.0000 mg | ORAL_TABLET | Freq: Every day | ORAL | Status: DC
  Administered 2016-03-27 – 2016-03-31 (×5): 20 mg via ORAL
  Filled 2016-03-27 (×5): qty 1

## 2016-03-27 NOTE — ED Notes (Signed)
Per critical care MD at bedside titrate levophed 1 mcg per minute to reach MAP of/greater than 65.

## 2016-03-27 NOTE — ED Notes (Signed)
PT have been made aware of urine sample 

## 2016-03-27 NOTE — ED Notes (Signed)
Patient reports shortness of breath-placed on 2L of oxygen nasal cannula

## 2016-03-27 NOTE — Progress Notes (Signed)
Patient noted to have three admissions in the last six months.  Patient being admitted with DKA.  EDCM went to speak to patient at bedside, however RN at bedside performing procedure.  Patient listed as having Medicare insurance without a pcp. Per chart review [facility]  Patient was also ordered bedside commode and rolling walker at that time.  [facility].  EDCM does not see note from that appointment.

## 2016-03-27 NOTE — Progress Notes (Signed)
CRITICAL VALUE ALERT  Critical value received: Lactic Acid 2.4  Date of notification:  03/27/16  Time of notification:  1816  Critical value read back:Yes.    Nurse who received alert:  Santiago GladAllison Schneider Warchol  MD notified (1st page):  Dr. Delton CoombesByrum spoken to face to face  Time of first page:  1818

## 2016-03-27 NOTE — ED Notes (Signed)
MD at bedside. 

## 2016-03-27 NOTE — ED Notes (Signed)
Mesner at bedside attempting central line.

## 2016-03-27 NOTE — ED Notes (Signed)
MD have been made aware of PT BP

## 2016-03-27 NOTE — ED Notes (Signed)
Awaiting insulin from pharmacy

## 2016-03-27 NOTE — ED Notes (Signed)
Per EMS patient from home c/o hyperglycemia, n/v and SOB that started last night.  Patient states that her CBG normally in 400's.  Patient unable to keep down her medications today.

## 2016-03-27 NOTE — ED Notes (Signed)
Bladder scanner read 763 mL

## 2016-03-27 NOTE — Progress Notes (Signed)
Utilization Review completed.  Story Vanvranken RN CM  

## 2016-03-27 NOTE — ED Provider Notes (Signed)
CSN: [id number]     Arrival date & time 03/27/16  1049 History   First MD Initiated Contact with Patient 03/27/16 1055     Chief Complaint  Patient presents with  . Emesis  . Hyperglycemia  . Shortness of Breath     (Consider location/radiation/quality/duration/timing/severity/associated sxs/prior Treatment) Patient is a 50 y.o. female presenting with hyperglycemia.  Hyperglycemia Blood sugar level PTA:  'High' on monitor Onset quality:  Gradual Duration:  2 days Timing:  Constant Progression:  Worsening Chronicity:  Recurrent Diabetes status:  Controlled with insulin and controlled with oral medications Context: noncompliance   Relieved by:  None tried Ineffective treatments:  None tried Associated symptoms: dehydration, fatigue, malaise, polyuria, shortness of breath and vomiting   Associated symptoms: no abdominal pain, no chest pain, no diaphoresis, no dysuria, no increased appetite and no increased thirst   Risk factors: hx of DKA     Past Medical History  Diagnosis Date  . Hypertension   . Hypothyroid   . 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 Pneumonia ~ 2007?  [name] Type II diabetes mellitus (HCC)     Insulin dependant  . Daily headache   . Arthritis     "elbows, knees, legs, back" (09/24/2015)  . Depression     "years ago"  . Kidney disease   . End stage renal disease (HCC)     right arm AV graft, post transplant   Past Surgical History  Procedure Laterality Date  . Back surgery    . Tubal ligation  07/1999  . Dg av dialysis graft declot or    . Nephrectomy transplanted organ    . Kidney transplant Right December 16, 2009  . Toe amputation Right     1,2 & 3rd toes.  . Cataract extraction w/ intraocular lens  implant, bilateral Bilateral   . Amputation Left 05/11/2014    Procedure: AMPUTATION LEFT GREAT TOE;  Surgeon: Kathryne Hitch, MD;  Location: [facility];  Service: Orthopedics;  Laterality: Left;  . Incision and drainage Right  06/02/2015    Procedure: INCISION AND DRAINAGE OF RIGHT 3rd RAY RESECTION;  Surgeon: Kathryne Hitch, MD;  Location: [facility];  Service: Orthopedics;  Laterality: Right;  . Lumbar disc surgery  2001  . Cesarean section  07/1999  . Av fistula placement Right     forearm   Family History  Problem Relation Age of Onset  . Emphysema Mother   . Throat cancer Mother   . COPD Mother   . Cancer Mother   . Emphysema Father   . COPD Father   . Stroke Father   . ADD / ADHD Son    Social History  Substance Use Topics  . Smoking status: Former Smoker -- 1.00 packs/day for 11 years    Types: Cigarettes    Quit date: 09/21/1998  . Smokeless tobacco: Never Used  . Alcohol Use: No   OB History    No data available     Review of Systems  Constitutional: Positive for fatigue. Negative for diaphoresis.  Respiratory: Positive for shortness of breath.   Cardiovascular: Negative for chest pain.  Gastrointestinal: Positive for vomiting. Negative for abdominal pain.  Endocrine: Positive for polyuria. Negative for polydipsia.  Genitourinary: Negative for dysuria.  All other systems reviewed and are negative.     Allergies  Ace inhibitors and Adhesive  Home Medications   Prior to Admission medications   Medication Sig Start Date End Date  Taking? Authorizing Provider  acetaminophen (TYLENOL) 500 MG tablet Take 500-1,000 mg by mouth every 6 (six) hours as needed for headache.   Yes Historical Provider, MD  amLODipine (NORVASC) 5 MG tablet Take 5 mg by mouth daily. 02/28/16  Yes Historical Provider, MD  aspirin EC 81 MG tablet Take 81 mg by mouth daily.   Yes Historical Provider, MD  carvedilol (COREG) 25 MG tablet Take 2 tablets (50 mg total) by mouth 2 (two) times daily. 03/04/15  Yes Alison Murray, MD  cyclobenzaprine (FLEXERIL) 10 MG tablet Take 10 mg by mouth daily. 02/13/16  Yes Historical Provider, MD  gabapentin (NEURONTIN) 300 MG capsule Take 300 mg by mouth 2 (two) times daily.  02/13/16  Yes Historical Provider, MD  insulin NPH-regular Human (NOVOLIN 70/30) (70-30) 100 UNIT/ML injection Inject 25 Units into the skin 2 (two) times daily with a meal. Patient taking differently: Inject 18 Units into the skin 2 (two) times daily with a meal.  11/06/15  Yes Catarina Hartshorn, MD  levothyroxine (SYNTHROID, LEVOTHROID) 137 MCG tablet Take 1 tablet (137 mcg total) by mouth daily. BRAND NAME ONLY 03/04/15  Yes Alison Murray, MD  metFORMIN (GLUCOPHAGE-XR) 500 MG 24 hr tablet Take 500 mg by mouth daily. 01/16/16  Yes Historical Provider, MD  mycophenolate (CELLCEPT) 250 MG capsule Take 500 mg by mouth 2 (two) times daily.    Yes Historical Provider, MD  predniSONE (DELTASONE) 5 MG tablet Take 5 mg by mouth daily. 05/29/15  Yes Historical Provider, MD  ranitidine (ZANTAC) 150 MG capsule Take 150 mg by mouth every evening.    Yes Historical Provider, MD  tacrolimus (PROGRAF) 1 MG capsule Take 1 mg by mouth 2 (two) times daily.    Yes Historical Provider, MD  insulin regular (NOVOLIN R) 100 units/mL injection Inject 0.06 mLs (6 Units total) into the skin 3 (three) times daily before meals. Patient not taking: Reported on 03/14/2016 11/06/15   Catarina Hartshorn, MD   BP 96/70 mmHg  Pulse 92  Temp(Src) 96.8 F (36 C) (Axillary)  Resp 26  Ht  (1.727 m)  Wt 230 lb (104.327 kg)  BMI 34.98 kg/m2  SpO2 100%  LMP 11/19/2011 Physical Exam  Constitutional: She is oriented to person, place, and time. She appears well-developed and well-nourished.  HENT:  Head: Normocephalic and atraumatic.  Mouth/Throat: Mucous membranes are dry. No oropharyngeal exudate or posterior oropharyngeal edema.  Eyes: Conjunctivae are normal. Pupils are equal, round, and reactive to light.  Neck: Normal range of motion.  Cardiovascular: Normal rate and regular rhythm.   Pulmonary/Chest: Effort normal. No accessory muscle usage or stridor. Tachypnea noted. No bradypnea. No respiratory distress. She has no decreased breath sounds.  She has no wheezes. She has no rhonchi. She has rales.  Abdominal: Soft. She exhibits no distension. There is no tenderness. There is no rebound.  Musculoskeletal: Normal range of motion. She exhibits no edema or tenderness.  Neurological: She is alert and oriented to person, place, and time.  Skin: Skin is warm and dry. No erythema.  Nursing note and vitals reviewed.   ED Course  .Central Line Date/Time: 03/27/2016 4:22 PM Performed by: Marily Memos Authorized by: Marily Memos Consent: Verbal consent obtained. Risks and benefits: risks, benefits and alternatives were discussed Consent given by: patient Patient understanding: patient states understanding of the procedure being performed Patient consent: the patient's understanding of the procedure matches consent given Patient identity confirmed: verbally with patient and arm band Time out: Immediately  prior to procedure a "time out" was called to verify the correct patient, procedure, equipment, support staff and site/side marked as required. Indications: vascular access Anesthesia: local infiltration Local anesthetic: lidocaine 1% with epinephrine Anesthetic total: 5 ml Patient sedated: no Preparation: skin prepped with 2% chlorhexidine Skin prep agent dried: skin prep agent completely dried prior to procedure Sterile barriers: all five maximum sterile barriers used - cap, mask, sterile gown, sterile gloves, and large sterile sheet Hand hygiene: hand hygiene performed prior to central venous catheter insertion Location details: left internal jugular Site selection rationale: patient comfort Patient position: Trendelenburg Catheter type: triple lumen Catheter size: 7 Fr Ultrasound guidance: yes Sterile ultrasound techniques: sterile gel and sterile probe covers were used Number of attempts: 1 Successful placement: yes Post-procedure: line sutured and dressing applied Assessment: blood return through all ports,  free fluid  flow,  placement verified by x-ray and no pneumothorax on x-ray Patient tolerance: Patient tolerated the procedure well with no immediate complications   (including critical care time)  CRITICAL CARE Performed by: Marily MemosMesner, Zeniya Lapidus Total critical care time: 65 minutes Critical care time was exclusive of separately billable procedures and treating other patients. Critical care was necessary to treat or prevent imminent or life-threatening deterioration. Critical care was time spent personally by me on the following activities: development of treatment plan with patient and/or surrogate as well as nursing, discussions with consultants, evaluation of patient's response to treatment, examination of patient, obtaining history from patient or surrogate, ordering and performing treatments and interventions, ordering and review of laboratory studies, ordering and review of radiographic studies, pulse oximetry and re-evaluation of patient's condition.   Labs Review Labs Reviewed  BASIC METABOLIC PANEL - Abnormal; Notable for the following:    Sodium 130 (*)    Potassium 5.9 (*)    Chloride 94 (*)    CO2 <7 (*)    Glucose, Bld 827 (*)    BUN 45 (*)    Creatinine, Ser 2.46 (*)    GFR calc non Af Amer 22 (*)    GFR calc Af Amer 25 (*)    All other components within normal limits  CBC - Abnormal; Notable for the following:    WBC 12.5 (*)    MCHC 29.6 (*)    All other components within normal limits  BLOOD GAS, VENOUS - Abnormal; Notable for the following:    pH, Ven 7.108 (*)    pCO2, Ven 19.0 (*)    pO2, Ven 99.3 (*)    Bicarbonate 5.7 (*)    Acid-base deficit 22.8 (*)    All other components within normal limits  TROPONIN I - Abnormal; Notable for the following:    Troponin I 0.05 (*)    All other components within normal limits  LACTIC ACID, PLASMA - Abnormal; Notable for the following:    Lactic Acid, Venous 2.1 (*)    All other components within normal limits  CBG MONITORING, ED -  Abnormal; Notable for the following:    Glucose-Capillary >600 (*)    All other components within normal limits  I-STAT CHEM 8, ED - Abnormal; Notable for the following:    Sodium 130 (*)    Potassium 5.8 (*)    Chloride 100 (*)    BUN 40 (*)    Creatinine, Ser 1.90 (*)    Glucose, Bld >700 (*)    Calcium, Ion 1.31 (*)    All other components within normal limits  CBG MONITORING, ED - Abnormal; Notable  for the following:    Glucose-Capillary >600 (*)    All other components within normal limits  CBG MONITORING, ED - Abnormal; Notable for the following:    Glucose-Capillary 552 (*)    All other components within normal limits  CBG MONITORING, ED - Abnormal; Notable for the following:    Glucose-Capillary 494 (*)    All other components within normal limits  CBG MONITORING, ED - Abnormal; Notable for the following:    Glucose-Capillary 402 (*)    All other components within normal limits  CULTURE, BLOOD (ROUTINE X 2)  URINE CULTURE  URINALYSIS, ROUTINE W REFLEX MICROSCOPIC (NOT AT [facility])  LIPASE, BLOOD  BASIC METABOLIC PANEL  BASIC METABOLIC PANEL  BASIC METABOLIC PANEL  BASIC METABOLIC PANEL  CBC  BETA-HYDROXYBUTYRIC ACID  PHOSPHORUS  MAGNESIUM  LACTIC ACID, PLASMA    Imaging Review Dg Chest Portable 1 View  03/27/2016  CLINICAL DATA:  Central line placement EXAM: PORTABLE CHEST 1 VIEW COMPARISON:  Earlier today at 1229 hours. FINDINGS: 1445 hours. Left internal jugular line terminates at the low SVC. Midline trachea. Normal heart size. The lower chest is partially excluded. Pulmonary interstitial thickening is likely chronic, accentuated by lower lung volumes on the current exam. No pneumothorax. Limited evaluation for pleural fluid. No lobar consolidation. IMPRESSION: Left-sided central line terminating at the low SVC, without pneumothorax. Exclusion of the lower chest. Interstitial thickening which appears increased, possibly due to lower lung volumes on the current exam.  Pulmonary venous congestion cannot be excluded. Electronically Signed   By: Jeronimo Greaves M.D.   On: 03/27/2016 14:57   Dg Chest Portable 1 View  03/27/2016  CLINICAL DATA:  Shortness of breath, weakness EXAM: PORTABLE CHEST 1 VIEW COMPARISON:  11/02/2015 FINDINGS: Increased interstitial markings. No focal consolidation or frank interstitial edema. No pleural effusion or pneumothorax. The heart is normal in size. IMPRESSION: No evidence of acute cardiopulmonary disease. Electronically Signed   By: Charline Bills M.D.   On: 03/27/2016 13:09   I have personally reviewed and evaluated these images and lab results as part of my medical decision-making.   EKG Interpretation   Date/Time:  Friday March 27 2016 11:22:39 EDT Ventricular Rate:  86 PR Interval:    QRS Duration: 151 QT Interval:  483 QTC Calculation: 578 R Axis:   147 Text Interpretation:  Sinus rhythm Consider left atrial enlargement Right  bundle branch block ST depr, consider ischemia, anterolateral lds more  pronounced than previously Confirmed by [facility] MD, Emilliano Dilworth [phone]) on  03/27/2016 11:31:12 AM      EKG Interpretation  Date/Time:  Friday March 27 2016 11:49:12 EDT Ventricular Rate:  86 PR Interval:    QRS Duration: 140 QT Interval:  454 QTC Calculation: 544 R Axis:   155 Text Interpretation:  POSTERIOR ECG, V4-6 represent V7-9 respectively Sinus rhythm Right bundle branch block Probable lateral infarct, age indeterminate Confirmed by [facility] MD, Barbara Cower [phone]) on 03/27/2016 12:03:23 PM         MDM   Final diagnoses:  Diabetic ketoacidosis without coma associated with type 1 diabetes mellitus (HCC)  Renal failure  Immunosuppression (HCC)  Shock (HCC)  Skin yeast infection  RBBB   Likely DKA. Has extensive rash, likely yeast infection to right thigh. Also with some new ecg changes, will check troponin. No chest pain. SOB likely 2/2 acidosis, however could be related to anginal equivalent as she is high risk.  Will obtain posterior ecg as well.   DKA confirmed, started insulin infusion.  Another bolus ordered. Posterior ecg without evidence of STEMI.   D/w cardiology. Likely medical causes for ecg and troponin changes. Needs continued medical therapy.   Patient with new persistently low blood pressures as low as 60s over 30s. No tachycardic response to that so a manual done verifying blood pressure of 70/38. Patient started on levophed drip, central line placed. We'll plan for ICU admission. Also will get cultures and lactic acid and started on broad-spectrum antibiotics in case this is infectious related. Her mental status is somewhat seems it was she here locally sleepy but conversant.  D/w CCM who requested bicarb drip and will see patient.   CCM to admit.  Marily Memos, MD 03/27/16 [phone]

## 2016-03-27 NOTE — Progress Notes (Signed)
Pharmacy Antibiotic Note  [name] is a 50 y.o. female admitted on 03/27/2016 with sepsis 2/2 possible cellulitis. Pharmacy has been consulted for Vancomycin and Zosyn dosing. Patient also placed on Fluconazole and Nystatin cream for right groin candidal dermatitis.   Plan:  Vancomycin 1g IV x 1 ordered per EDP. Give additional 1g IV x 1 now to = 2g loading dose. Continue with Vancomycin 1250mg  IV q24h.  Plan for Vancomycin trough level at steady state.  Zosyn 3.375g IV x 1 given in ED. Continue with Zosyn 3.375g IV q8h (infuse over 4 hours).  Monitor renal function, cultures, clinical course.  Height: 5\' 8"  (172.7 cm) Weight: 230 lb (104.327 kg) IBW/kg (Calculated) : 63.9  Temp (24hrs), Avg:97.1 F (36.2 C), Min:96.5 F (35.8 C), Max:97.9 F (36.6 C)   Recent Labs Lab 03/27/16 1119 03/27/16 1136 03/27/16 1446  WBC 12.5*  --   --   CREATININE 2.46* 1.90*  --   LATICACIDVEN  --   --  2.1*    Estimated Creatinine Clearance: 45.3 mL/min (by C-G formula based on Cr of 1.9).    Allergies  Allergen Reactions  . Ace Inhibitors Cough    Lisinopril  . Adhesive [Tape] Itching    Antimicrobials this admission: 7/21 >> Vancomycin >> 7/21 >> Zosyn >> 7/21 >> Fluconazole (per MD) >> 7/21 >> Nystatin cream (per MD) >>  Dose adjustments this admission: --  Microbiology results: 7/21 BCx: sent 7/21 UCx: sent  7/21 MRSA PCR: sent  Thank you for allowing pharmacy to be a part of this patient's care.   Greer PickerelJigna [name], PharmD, BCPS Pager: [phone] 03/27/2016 4:19 PM

## 2016-03-27 NOTE — ED Notes (Signed)
MD/RN notified of abnormal lab 

## 2016-03-27 NOTE — Progress Notes (Signed)
eLink Physician-Brief Progress Note Patient Name: Exie Parodyamela S Mackiewicz DOB: 07/01/1966 MRN: [id number]   Date of Service  03/27/2016  HPI/Events of Note  Hypokalemia  eICU Interventions  Potassium replaced     Intervention Category Intermediate Interventions: Electrolyte abnormality - evaluation and management  DETERDING,ELIZABETH 03/27/2016, 11:38 PM

## 2016-03-27 NOTE — ED Notes (Signed)
Bed: [id number] Expected date:  Expected time:  Means of arrival:  Comments: EMS- 50 yo  hyperglycemia

## 2016-03-27 NOTE — H&P (Signed)
PULMONARY / CRITICAL CARE MEDICINE   Name: [name] MRN: [id number] DOB: 02-Mar-1966    ADMISSION DATE:  03/27/2016 CONSULTATION DATE: 7/21  REFERRING MD:  Erin Hearing   CHIEF COMPLAINT:  Nausea, vomiting severe metabolic acidosis   HISTORY OF PRESENT ILLNESS:   This is a 50 yoF w/ sig h/o DM, ESRD s/p x-plant 2011 (on pred and cellcept). Presented to ED w/ cc: nausea, vomiting and worsening shortness of breath over 3-5d history. She denied sick exposure, did report fever and chills. Has had fairly painful red and extensive rash on her right groin (developed over last couple of days). No sig cough, ab abd pain, no dysuria or frequency. On presentation found to have: significant metabolic acidosis, Glucose > 700, and Acute on chronic renal failure. Will admit her w. Working dx of probable DKA, severe dehydration, AKI and possible sepsis, she has a central line we will cont aggressive volume resuscitation efforts.  -reports no change in meds -reports takes insulin as directed -glucose been reading "high for last couple of days"  PAST MEDICAL HISTORY :  She  has a past medical history of Hypertension; Hypothyroid; Immunosuppression (HCC); Pneumonia (~ 2007?); Type II diabetes mellitus (HCC); Daily headache; Arthritis; Depression; Kidney disease; and End stage renal disease (HCC).  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  Allergies  Allergen Reactions  . Ace Inhibitors Cough    Lisinopril  . Adhesive [Tape] Itching    No current facility-administered medications  on file prior to encounter.   Current Outpatient Prescriptions on File Prior to Encounter  Medication Sig  . acetaminophen (TYLENOL) 500 MG tablet Take 500-1,000 mg by mouth every 6 (six) hours as needed for headache.  [name] amLODipine (NORVASC) 5 MG tablet Take 5 mg by mouth daily.  [name] aspirin EC 81 MG tablet Take 81 mg by mouth daily.  . carvedilol (COREG) 25 MG tablet Take 2 tablets (50 mg total) by mouth 2 (two) times daily.  . cyclobenzaprine (FLEXERIL) 10 MG tablet Take 10 mg by mouth daily.  [name] gabapentin (NEURONTIN) 300 MG capsule Take 300 mg by mouth 2 (two) times daily.  . insulin NPH-regular Human (NOVOLIN 70/30) (70-30) 100 UNIT/ML injection Inject 25 Units into the skin 2 (two) times daily with a meal. (Patient taking differently: Inject 18 Units into the skin 2 (two) times daily with a meal. )  . levothyroxine (SYNTHROID, LEVOTHROID) 137 MCG tablet Take 1 tablet (137 mcg total) by mouth daily. BRAND NAME ONLY  . metFORMIN (GLUCOPHAGE-XR) 500 MG 24 hr tablet Take 500 mg by mouth daily.  . mycophenolate (CELLCEPT) 250 MG capsule Take 500 mg by mouth 2 (two) times daily.   . predniSONE (DELTASONE) 5 MG tablet Take 5 mg by mouth daily.  . ranitidine (ZANTAC) 150 MG capsule Take 150 mg by mouth every evening.   . tacrolimus (PROGRAF) 1 MG capsule Take 1 mg by mouth 2 (two) times daily.   . insulin regular (NOVOLIN R) 100 units/mL injection Inject 0.06 mLs (6 Units total) into the skin 3 (three) times daily before meals. (Patient not taking: Reported on 03/14/2016)    FAMILY HISTORY:  Her indicated that her  mother is deceased. She indicated that her father is deceased.   SOCIAL HISTORY: She  reports that she quit smoking about 17 years ago. Her smoking use included Cigarettes. She has a 11 pack-year smoking history. She has never used smokeless tobacco. She reports that she does not drink alcohol or use illicit drugs.  REVIEW OF SYSTEMS:   Gen: subjective fevers and chlls, HENT: no HA,  sinus congestion,. Card: no pals, no CP. Lungs: SOB thisam. She has had chronic cough, abd: +N/V/abd pain. Poor po intake. Ext: no increase in edema   SUBJECTIVE:  No distress   VITAL SIGNS: BP 104/61 mmHg  Pulse 92  Temp(Src) 96.1 F (35.6 C) (Axillary)  Resp 28  Ht  (1.727 m)  Wt 230 lb (104.327 kg)  BMI 34.98 kg/m2  SpO2 99%  LMP 11/19/2011  HEMODYNAMICS:    VENTILATOR SETTINGS:    INTAKE / OUTPUT:    PHYSICAL EXAMINATION: General:  chronically ill appearing w/ female  Neuro:  Awake, alert, no focal def  HEENT:  NCAT, MM are dry  Cardiovascular:  RRR w/out MRG  Lungs:  Decreased bilaterally. No accessory use  Abdomen:  Soft, not tender. No flank pain. No stool color Musculoskeletal:  Equal st and bulk Skin:  Warm, right groin w/ damp v wet erythemic rash.   LABS:  BMET  Recent Labs Lab 03/27/16 1119 03/27/16 1136  NA 130* 130*  K 5.9* 5.8*  CL 94* 100*  CO2 <7*  --   BUN 45* 40*  CREATININE 2.46* 1.90*  GLUCOSE 827* >700*    Electrolytes  Recent Labs Lab 03/27/16 1119  CALCIUM 9.5    CBC  Recent Labs Lab 03/27/16 1119 03/27/16 1136  WBC 12.5*  --   HGB 12.0 13.6  HCT 40.5 40.0  PLT 230  --     Coag's No results for input(s): APTT, INR in the last 168 hours.  Sepsis Markers  Recent Labs Lab 03/27/16 1446  LATICACIDVEN 2.1*    ABG No results for input(s): PHART, PCO2ART, PO2ART in the last 168 hours.  Liver Enzymes No results for input(s): AST, ALT, ALKPHOS, BILITOT, ALBUMIN in the last 168 hours.  Cardiac Enzymes  Recent Labs Lab 03/27/16 1131  TROPONINI 0.05*    Glucose  Recent Labs Lab 03/27/16 1117 03/27/16 1215 03/27/16 1348 03/27/16 1447 03/27/16 1558  GLUCAP >600* >600* 552* 494* 402*    Imaging Dg Chest Portable 1 View  03/27/2016  CLINICAL DATA:  Central line placement EXAM: PORTABLE CHEST 1 VIEW COMPARISON:  Earlier today at 1229 hours. FINDINGS: 1445 hours. Left internal jugular line  terminates at the low SVC. Midline trachea. Normal heart size. The lower chest is partially excluded. Pulmonary interstitial thickening is likely chronic, accentuated by lower lung volumes on the current exam. No pneumothorax. Limited evaluation for pleural fluid. No lobar consolidation. IMPRESSION: Left-sided central line terminating at the low SVC, without pneumothorax. Exclusion of the lower chest. Interstitial thickening which appears increased, possibly due to lower lung volumes on the current exam. Pulmonary venous congestion cannot be excluded. Electronically Signed   By: Jeronimo Greaves M.D.   On: 03/27/2016 14:57   Dg Chest Portable 1 View  03/27/2016  CLINICAL DATA:  Shortness of breath, weakness EXAM: PORTABLE CHEST 1 VIEW COMPARISON:  11/02/2015 FINDINGS: Increased interstitial markings. No focal consolidation or frank interstitial edema. No pleural effusion or pneumothorax. The heart is normal in size. IMPRESSION: No evidence of acute cardiopulmonary disease. Electronically Signed  By: Charline BillsSriyesh  Krishnan M.D.   On: 03/27/2016 13:09   cxr w/ left IJ in satisfactory postion.   STUDIES:    CULTURES: BCX2 7/21>>>> UC 9/21 >>>  ANTIBIOTICS: vanc 7/21>>> Zosyn 7/21>>> Diflucan 7/21>>>  SIGNIFICANT EVENTS:   LINES/TUBES: Left IJ CVL 9/20>>>  DISCUSSION: 50 year old female who presents w/ cc: N/V, dehydration, hyperglycemia, AKI and hypotension. Will admit w/ working dx of DKA and hypovolemic vs septic shock. Possible source if infection would be a cellulitis vs UT. Her right groin is very erythremic and painful. Will cont IVFs, cont IV bicarb infusion, cont insulin gtt.   ASSESSMENT / PLAN:  PULMONARY A: Dyspnea  P:   Pulse ox Wean O2 NPO except meds  CARDIOVASCULAR A:  Septic vs hypovolemic shock  P:  Cont IV hydration per  Se ID section Hold antihypertensives  RENAL A:   ESRD/CKD stage II w/ prior transplant back in 2011 Mixed AGMA and NAMGA  Severe hyperkalemia   Strict I&O  P:   Cont prograf Stress dose steroid  Trend chems Strict I&O   GASTROINTESTINAL A:   Nausea, vomiting and abd pain  P:   Ck lipase NPO except meds  HEMATOLOGIC A:   No acute  P:  Trend CBC Cont [place] heparin   INFECTIOUS A:   SIRS r/o sepsis Right groin candidal dermatitis  H/o bedbugs  P:   Contact isolation F/u culture data Wound care  Vanc/zosyn for possible cellulitis Also add diflucan, topical nystatin (given the fact she is on chronic immunosuppression)  ENDOCRINE A:   Hyperglycemia; probably DKA   hypothyroidism  P:   Cont home synthroid Ck TSH DKA protocol   NEUROLOGIC A:   No acute  P:   RASS goal: 0 Hold sedating meds   FAMILY  - Updates:   - Inter-disciplinary family meet or Palliative Care meeting due by:  7/30  My critical care time 60 minutes.   Simonne MartinetPeter E Babcock ACNP-BC [facility] Pager # [phone] OR # [phone] if no answer 03/27/2016, 4:31 PM   Attending Note:  I have examined patient, reviewed labs, studies and notes. I have discussed the case with Kreg ShropshireP Babcock, and I agree with the data and plans as amended above. 50 yo woman with functional renal Tx on immunosuppression, brittle DM, admitted with severe metabolic acidosis consistent with DKA. Has had N/V, fevers, chills over the last few days with associated labile CBG's. Note also R groin erythematous rash that she tells me "always happens when she gets DKA". On my evaluation she is awake, interacts appropriately, has a clear lung exam. R groin rash damp and red. dry OP. Will give aggressive IVF's, continue insulin gtt. We will treat with empiric abx until we are able to determine whether there is an infectious trigger to her decompensation. Will narrow as clinical course and cx data dictate. I will stop her bicarb gtt now. Norepi has been weaned to off on arrival to the ICU. Next set of labs pending. Independent critical care time is 40 minutes.    Levy Pupaobert Alishea Beaudin, MD, PhD 03/27/2016, 5:43 PM [facility] [phone] or if no answer [phone]

## 2016-03-28 LAB — BASIC METABOLIC PANEL
ANION GAP: 8 (ref 5–15)
ANION GAP: 8 (ref 5–15)
Anion gap: 8 (ref 5–15)
BUN: 39 mg/dL — AB (ref 6–20)
BUN: 35 mg/dL — ABNORMAL HIGH (ref 6–20)
BUN: 30 mg/dL — ABNORMAL HIGH (ref 6–20)
CHLORIDE: 111 mmol/L (ref 101–111)
CHLORIDE: 112 mmol/L — AB (ref 101–111)
CO2: 19 mmol/L — ABNORMAL LOW (ref 22–32)
CO2: 19 mmol/L — ABNORMAL LOW (ref 22–32)
CO2: 19 mmol/L — ABNORMAL LOW (ref 22–32)
CREATININE: 1.4 mg/dL — AB (ref 0.44–1.00)
Calcium: 8.3 mg/dL — ABNORMAL LOW (ref 8.9–10.3)
Calcium: 8.4 mg/dL — ABNORMAL LOW (ref 8.9–10.3)
Calcium: 9 mg/dL (ref 8.9–10.3)
Chloride: 111 mmol/L (ref 101–111)
Creatinine, Ser: 1.76 mg/dL — ABNORMAL HIGH (ref 0.44–1.00)
Creatinine, Ser: 1.47 mg/dL — ABNORMAL HIGH (ref 0.44–1.00)
GFR calc Af Amer: 38 mL/min — ABNORMAL LOW (ref >60)
GFR calc non Af Amer: 33 mL/min — ABNORMAL LOW (ref >60)
GFR, EST AFRICAN AMERICAN: 47 mL/min — AB (ref >60)
GFR, EST AFRICAN AMERICAN: 50 mL/min — AB (ref >60)
GFR, EST NON AFRICAN AMERICAN: 41 mL/min — AB (ref >60)
GFR, EST NON AFRICAN AMERICAN: 43 mL/min — AB (ref >60)
GLUCOSE: 139 mg/dL — AB (ref 65–99)
Glucose, Bld: 161 mg/dL — ABNORMAL HIGH (ref 65–99)
Glucose, Bld: 148 mg/dL — ABNORMAL HIGH (ref 65–99)
POTASSIUM: 3.6 mmol/L (ref 3.5–5.1)
POTASSIUM: 3.8 mmol/L (ref 3.5–5.1)
POTASSIUM: 3.6 mmol/L (ref 3.5–5.1)
SODIUM: 138 mmol/L (ref 135–145)
SODIUM: 138 mmol/L (ref 135–145)
SODIUM: 139 mmol/L (ref 135–145)

## 2016-03-28 LAB — GLUCOSE, CAPILLARY
GLUCOSE-CAPILLARY: 162 mg/dL — AB (ref 65–99)
GLUCOSE-CAPILLARY: 137 mg/dL — AB (ref 65–99)
GLUCOSE-CAPILLARY: 134 mg/dL — AB (ref 65–99)
GLUCOSE-CAPILLARY: 129 mg/dL — AB (ref 65–99)
GLUCOSE-CAPILLARY: 147 mg/dL — AB (ref 65–99)
GLUCOSE-CAPILLARY: 172 mg/dL — AB (ref 65–99)
Glucose-Capillary: 146 mg/dL — ABNORMAL HIGH (ref 65–99)
Glucose-Capillary: 175 mg/dL — ABNORMAL HIGH (ref 65–99)
Glucose-Capillary: 201 mg/dL — ABNORMAL HIGH (ref 65–99)
Glucose-Capillary: 124 mg/dL — ABNORMAL HIGH (ref 65–99)
Glucose-Capillary: 128 mg/dL — ABNORMAL HIGH (ref 65–99)
Glucose-Capillary: 189 mg/dL — ABNORMAL HIGH (ref 65–99)

## 2016-03-28 LAB — MAGNESIUM: Magnesium: 1.1 mg/dL — ABNORMAL LOW (ref 1.7–2.4)

## 2016-03-28 LAB — CBC
HEMATOCRIT: 35.1 % — AB (ref 36.0–46.0)
HEMOGLOBIN: 11.5 g/dL — AB (ref 12.0–15.0)
MCH: 28.4 pg (ref 26.0–34.0)
MCHC: 32.8 g/dL (ref 30.0–36.0)
MCV: 86.7 fL (ref 78.0–100.0)
Platelets: 178 10*3/uL (ref 150–400)
RBC: 4.05 MIL/uL (ref 3.87–5.11)
RDW: 14.3 % (ref 11.5–15.5)
WBC: 6.4 10*3/uL (ref 4.0–10.5)

## 2016-03-28 LAB — C DIFFICILE QUICK SCREEN W PCR REFLEX
C DIFFICILE (CDIFF) INTERP: NOT DETECTED
C DIFFICILE (CDIFF) TOXIN: NEGATIVE
C DIFFICLE (CDIFF) ANTIGEN: NEGATIVE

## 2016-03-28 LAB — URINE CULTURE: CULTURE: NO GROWTH

## 2016-03-28 LAB — PHOSPHORUS: PHOSPHORUS: 2 mg/dL — AB (ref 2.5–4.6)

## 2016-03-28 MED ORDER — CARVEDILOL 25 MG PO TABS
25.0000 mg | ORAL_TABLET | Freq: Two times a day (BID) | ORAL | Status: DC
  Administered 2016-03-28 – 2016-03-31 (×7): 25 mg via ORAL
  Filled 2016-03-28: qty 1
  Filled 2016-03-28: qty 2
  Filled 2016-03-28: qty 1
  Filled 2016-03-28: qty 2
  Filled 2016-03-28 (×3): qty 1

## 2016-03-28 MED ORDER — INSULIN ASPART PROT & ASPART (70-30 MIX) 100 UNIT/ML ~~LOC~~ SUSP
18.0000 [IU] | Freq: Two times a day (BID) | SUBCUTANEOUS | Status: DC
  Filled 2016-03-28: qty 10

## 2016-03-28 MED ORDER — INSULIN ASPART 100 UNIT/ML ~~LOC~~ SOLN
0.0000 [IU] | Freq: Every day | SUBCUTANEOUS | Status: DC
  Administered 2016-03-30: 3 [IU] via SUBCUTANEOUS

## 2016-03-28 MED ORDER — MAGNESIUM SULFATE 4 GM/100ML IV SOLN
4.0000 g | Freq: Once | INTRAVENOUS | Status: AC
  Administered 2016-03-28: 4 g via INTRAVENOUS
  Filled 2016-03-28: qty 100

## 2016-03-28 MED ORDER — INSULIN GLARGINE 100 UNIT/ML ~~LOC~~ SOLN
15.0000 [IU] | Freq: Two times a day (BID) | SUBCUTANEOUS | Status: DC
  Filled 2016-03-28: qty 0.15

## 2016-03-28 MED ORDER — PREDNISONE 5 MG PO TABS
5.0000 mg | ORAL_TABLET | Freq: Every day | ORAL | Status: DC
  Administered 2016-03-28 – 2016-03-31 (×4): 5 mg via ORAL
  Filled 2016-03-28 (×4): qty 1

## 2016-03-28 MED ORDER — INSULIN ASPART PROT & ASPART (70-30 MIX) 100 UNIT/ML ~~LOC~~ SUSP
18.0000 [IU] | Freq: Two times a day (BID) | SUBCUTANEOUS | Status: DC
  Administered 2016-03-28 – 2016-03-31 (×8): 18 [IU] via SUBCUTANEOUS
  Filled 2016-03-28 (×2): qty 10

## 2016-03-28 MED ORDER — CHOLESTYRAMINE 4 G PO PACK
4.0000 g | PACK | ORAL | Status: DC
  Administered 2016-03-28 – 2016-03-31 (×7): 4 g via ORAL
  Filled 2016-03-28 (×12): qty 1

## 2016-03-28 MED ORDER — INSULIN ASPART 100 UNIT/ML ~~LOC~~ SOLN
0.0000 [IU] | Freq: Three times a day (TID) | SUBCUTANEOUS | Status: DC
  Administered 2016-03-28: 7 [IU] via SUBCUTANEOUS
  Administered 2016-03-28: 3 [IU] via SUBCUTANEOUS
  Administered 2016-03-29: 15 [IU] via SUBCUTANEOUS
  Administered 2016-03-29: 7 [IU] via SUBCUTANEOUS
  Administered 2016-03-29: 3 [IU] via SUBCUTANEOUS
  Administered 2016-03-30: 7 [IU] via SUBCUTANEOUS
  Administered 2016-03-30: 4 [IU] via SUBCUTANEOUS
  Administered 2016-03-30: 11 [IU] via SUBCUTANEOUS
  Administered 2016-03-31: 3 [IU] via SUBCUTANEOUS
  Administered 2016-03-31: 4 [IU] via SUBCUTANEOUS
  Administered 2016-03-31: 7 [IU] via SUBCUTANEOUS

## 2016-03-28 NOTE — Progress Notes (Signed)
eLink Physician-Brief Progress Note Patient Name: [name] DOB: Oct 14, 1965 MRN: [id number]   Date of Service  03/28/2016  HPI/Events of Note  Frequent loose BM's.  eICU Interventions  Will order: 1. Questran 4 gm PO TID.     Intervention Category Intermediate Interventions: Other:  Lenell Antu 03/28/2016, 7:57 PM

## 2016-03-28 NOTE — Progress Notes (Signed)
eLink Physician-Brief Progress Note Patient Name: [name] DOB: [birth year] MRN: [id number]   Date of Service  03/28/2016  HPI/Events of Note  Hypomag  eICU Interventions  Mag replaced     Intervention Category Intermediate Interventions: Electrolyte abnormality - evaluation and management  DETERDING,ELIZABETH 03/28/2016, 1:51 AM

## 2016-03-28 NOTE — Progress Notes (Signed)
PULMONARY / CRITICAL CARE MEDICINE   Name: [name] MRN: [id number] DOB: Aug 31, 1966    ADMISSION DATE:  03/27/2016 CONSULTATION DATE: 7/21  REFERRING MD:  [name]   CHIEF COMPLAINT:  Nausea, vomiting severe metabolic acidosis   HISTORY OF PRESENT ILLNESS:   This is a 50 yoF w/ sig h/o DM, ESRD s/p x-plant 2011 (on pred and cellcept). Presented to ED w/ cc: nausea, vomiting and worsening shortness of breath over 3-5d history. She denied sick exposure, did report fever and chills. Has had fairly painful red and extensive rash on her right groin (developed over last couple of days). No sig cough, ab abd pain, no dysuria or frequency. On presentation found to have: significant metabolic acidosis, Glucose > 700, and Acute on chronic renal failure. Will admit her w. Working dx of probable DKA, severe dehydration, AKI and possible sepsis, she has a central line we will cont aggressive volume resuscitation efforts.  -reports no change in meds -reports takes insulin as directed -glucose been reading "high for last couple of days"   SUBJECTIVE:  AG has closed.  Feels better, hungry Has had some loose stools - C diff negative  VITAL SIGNS: BP 148/49 mmHg  Pulse 93  Temp(Src) 100 F (37.8 C) (Core (Comment))  Resp 14  Ht  (1.727 m)  Wt 104.327 kg (230 lb)  BMI 34.98 kg/m2  SpO2 96%  LMP 11/19/2011  HEMODYNAMICS:    VENTILATOR SETTINGS:    INTAKE / OUTPUT: I/O last 3 completed shifts: In: 2438.8 [I.V.:1888.8; IV Piggyback:550] Out: 1001 [Urine:1000; Stool:1]  PHYSICAL EXAMINATION: General:  chronically ill appearing w/ female  Neuro:  Awake, alert, no focal def  HEENT:  NCAT, MM are dry  Cardiovascular:  RRR w/out MRG  Lungs:  Decreased bilaterally. No accessory use  Abdomen:  Soft, not tender. No flank pain. No stool color Musculoskeletal:  Equal st and bulk Skin:  Warm, right groin w/ damp v wet erythemic rash.   LABS:  BMET  Recent Labs Lab  03/27/16 2150 03/28/16 0105 03/28/16 0500  NA 139 138 138  K 3.4* 3.6 3.8  CL 111 111 111  CO2 19* 19* 19*  BUN 39* 39* 35*  CREATININE 1.71* 1.76* 1.47*  GLUCOSE 203* 161* 139*    Electrolytes  Recent Labs Lab 03/27/16 1739 03/27/16 2150 03/28/16 0105 03/28/16 0500  CALCIUM 8.4* 8.3* 8.3* 8.4*  MG 1.4*  --  1.1*  --   PHOS 4.0  --  2.0*  --     CBC  Recent Labs Lab 03/27/16 1119 03/27/16 1136 03/27/16 1739 03/28/16 0105  WBC 12.5*  --  2.4* 6.4  HGB 12.0 13.6 11.1* 11.5*  HCT 40.5 40.0 35.9* 35.1*  PLT 230  --  190 178    Coag's No results for input(s): APTT, INR in the last 168 hours.  Sepsis Markers  Recent Labs Lab 03/27/16 1446 03/27/16 1739  LATICACIDVEN 2.1* 2.4*    ABG No results for input(s): PHART, PCO2ART, PO2ART in the last 168 hours.  Liver Enzymes No results for input(s): AST, ALT, ALKPHOS, BILITOT, ALBUMIN in the last 168 hours.  Cardiac Enzymes  Recent Labs Lab 03/27/16 1131  TROPONINI 0.05*    Glucose  Recent Labs Lab 03/28/16 0045 03/28/16 0148 03/28/16 0252 03/28/16 0348 03/28/16 0456 03/28/16 0552  GLUCAP 175* 162* 137* 146* 134* 129*    Imaging Dg Chest Portable 1 View  03/27/2016  CLINICAL DATA:  Central line placement EXAM: PORTABLE CHEST 1  VIEW COMPARISON:  Earlier today at 1229 hours. FINDINGS: 1445 hours. Left internal jugular line terminates at the low SVC. Midline trachea. Normal heart size. The lower chest is partially excluded. Pulmonary interstitial thickening is likely chronic, accentuated by lower lung volumes on the current exam. No pneumothorax. Limited evaluation for pleural fluid. No lobar consolidation. IMPRESSION: Left-sided central line terminating at the low SVC, without pneumothorax. Exclusion of the lower chest. Interstitial thickening which appears increased, possibly due to lower lung volumes on the current exam. Pulmonary venous congestion cannot be excluded. Electronically Signed   By: Jeronimo Greaves M.D.   On: 03/27/2016 14:57   Dg Chest Portable 1 View  03/27/2016  CLINICAL DATA:  Shortness of breath, weakness EXAM: PORTABLE CHEST 1 VIEW COMPARISON:  11/02/2015 FINDINGS: Increased interstitial markings. No focal consolidation or frank interstitial edema. No pleural effusion or pneumothorax. The heart is normal in size. IMPRESSION: No evidence of acute cardiopulmonary disease. Electronically Signed   By: Charline Bills M.D.   On: 03/27/2016 13:09   cxr w/ left IJ in satisfactory postion.   STUDIES:    CULTURES: BCX2 7/21>>>> UC 7/21 >>> C diff 7/21 >. negative  ANTIBIOTICS: vanc 7/21>>> 7/22 Zosyn 7/21>>> 7/22 Diflucan 7/21>>> (plan 5-7 days)  SIGNIFICANT EVENTS:   LINES/TUBES: Left IJ CVL 9/20>>>  DISCUSSION: 50 year old female who presents w/ cc: N/V, dehydration, hyperglycemia, AKI and hypotension. Will admit w/ working dx of DKA and hypovolemic vs septic shock. Possible source if infection would be a cellulitis vs UT. Her right groin is very erythremic and painful. Will cont IVFs, cont IV bicarb infusion, cont insulin gtt.   ASSESSMENT / PLAN:  PULMONARY A: Dyspnea due to severe acidosis, resolved P:   Pulse ox Wean O2  CARDIOVASCULAR A:  Septic vs hypovolemic shock  P:  Responded well to IVF See ID section Hold antihypertensives for now, restart when able  RENAL A:   ESRD/CKD stage II w/ prior transplant back in 2011 Mixed AGMA and NAMGA  Severe hyperkalemia, resolved Strict I&O  P:   Cont prograf Convert hydrocort back to home pred on 7/22 Trend chem panel Strict I&O   GASTROINTESTINAL A:   Nausea, vomiting and abd pain, improved P:   Start carb modified diet on 7/22  HEMATOLOGIC A:   No acute  P:  Trend CBC Cont [place] heparin   INFECTIOUS A:   SIRS r/o sepsis, no clear source identified Right groin candidal dermatitis  H/o bedbugs  P:   Contact isolation F/u culture data Wound care  Will d/c Vanc/zosyn on 7/22 and  follow clinically, consider restart abx for cellulitis if she becomes febrile, etc.  Continue diflucan, topical nystatin for apparent candidal infxn R groin  ENDOCRINE A:   DKA   hypothyroidism  P:   Cont home synthroid Ck TSH Transition off D50.45NS and insulin gtt this am 7/22 Restart her home 70/30 insulin and SS regular Hold off on metformin for now, likely restart before d/c to home.   NEUROLOGIC A:   No acute  P:   RASS goal: 0 Hold sedating meds   FAMILY  - Updates:   - Inter-disciplinary family meet or Palliative Care meeting due by:  7/30  Transition to SDU status on 7/22.   Levy Pupa, MD, PhD 03/28/2016, 9:50 AM [facility] [phone] or if no answer [phone]

## 2016-03-28 NOTE — Progress Notes (Signed)
Inpatient Diabetes Program Recommendations  AACE/ADA: New Consensus Statement on Inpatient Glycemic Control (2015)  Target Ranges:  Prepandial:   less than 140 mg/dL      Peak postprandial:   less than 180 mg/dL (1-2 hours)      Critically ill patients:  140 - 180 mg/dL   Results for PAMI, WOOL (MRN [id number]) as of 03/28/2016 13:59  Ref. Range 03/27/2016 11:19  Sodium Latest Ref Range: 135-145 mmol/L 130 (L)  Potassium Latest Ref Range: 3.5-5.1 mmol/L 5.9 (H)  Chloride Latest Ref Range: 101-111 mmol/L 94 (L)  CO2 Latest Ref Range: 22-32 mmol/L <7 (L)  BUN Latest Ref Range: 6-20 mg/dL 45 (H)  Creatinine Latest Ref Range: 0.44-1.00 mg/dL 2.46 (H)  Calcium Latest Ref Range: 8.9-10.3 mg/dL 9.5  EGFR (Non-African Amer.) Latest Ref Range: >60 mL/min 22 (L)  EGFR (African American) Latest Ref Range: >60 mL/min 25 (L)  Glucose Latest Ref Range: 65-99 mg/dL 827 (HH)  Anion gap Latest Ref Range: 5-15  NOT CALCULATED    Admit with: DKA/ Questionable Sepsis  History: DM, ESRD, Kidney Transplant 2011  Home DM Meds: 70/30 Insulin- 18 units bidwc       Metformin 500 mg daily  Current Insulin Orders: 70/30 Insulin- 18 units bidwc      Novolog Resistant Correction Scale/ SSI (0-20 units) TID AC + HS     -Patient transitioned off IV Insulin drip this AM.  Given 18 units 70/30 insulin at 11am.  -Counseled by the Inpatient Glycemic Control Team during previous admissions about the importance of good glucose control at home.  -Per MD notes, patient was supposed to follow- up with the [facility] and [place] on 11/08/15 after her last admission, however, I do not see that patient followed up with the clinic as instructed.  Likely poorly compliant with insulin regimen and diet at home.    -DM Coordinator available by phone over the weekend for questions/concerns.  Can follow-up with patient on Monday (07/24) if patient has not been discharged over the  weekend.     MD- Please consider recheck of Hemoglobin A1c level.  Last A1c was 11.7% on 11/02/15  Note Care Management currently following patient for PCP/medication needs.     --Will follow patient during hospitalization--  Wyn Quaker RN, MSN, CDE Diabetes Coordinator Inpatient Glycemic Control Team Team Pager: [phone] (8a-5p)

## 2016-03-28 NOTE — Progress Notes (Signed)
eLink Physician-Brief Progress Note Patient Name: [name] DOB: 21-Feb-1966 MRN: [id number]   Date of Service  03/28/2016  HPI/Events of Note  Hypertension - BP 172/64.  eICU Interventions  Will restart home Coreg 25 mg PO BID. First dose now.      Intervention Category Major Interventions: Hypertension - evaluation and management  Cullin Dishman Eugene 03/28/2016, 9:59 PM

## 2016-03-29 DIAGNOSIS — I10 Essential (primary) hypertension: Secondary | ICD-10-CM

## 2016-03-29 LAB — PHOSPHORUS: PHOSPHORUS: 2.2 mg/dL — AB (ref 2.5–4.6)

## 2016-03-29 LAB — CBC
HEMATOCRIT: 35.9 % — AB (ref 36.0–46.0)
HEMOGLOBIN: 11.3 g/dL — AB (ref 12.0–15.0)
MCH: 27.8 pg (ref 26.0–34.0)
MCHC: 31.5 g/dL (ref 30.0–36.0)
MCV: 88.4 fL (ref 78.0–100.0)
Platelets: 147 10*3/uL — ABNORMAL LOW (ref 150–400)
RBC: 4.06 MIL/uL (ref 3.87–5.11)
RDW: 14.6 % (ref 11.5–15.5)
WBC: 2.9 10*3/uL — ABNORMAL LOW (ref 4.0–10.5)

## 2016-03-29 LAB — GLUCOSE, CAPILLARY
GLUCOSE-CAPILLARY: 113 mg/dL — AB (ref 65–99)
GLUCOSE-CAPILLARY: 226 mg/dL — AB (ref 65–99)
GLUCOSE-CAPILLARY: 133 mg/dL — AB (ref 65–99)
Glucose-Capillary: 312 mg/dL — ABNORMAL HIGH (ref 65–99)
Glucose-Capillary: 148 mg/dL — ABNORMAL HIGH (ref 65–99)

## 2016-03-29 LAB — BASIC METABOLIC PANEL
Anion gap: 5 (ref 5–15)
BUN: 25 mg/dL — AB (ref 6–20)
CALCIUM: 8.7 mg/dL — AB (ref 8.9–10.3)
CHLORIDE: 115 mmol/L — AB (ref 101–111)
CO2: 20 mmol/L — AB (ref 22–32)
CREATININE: 1.27 mg/dL — AB (ref 0.44–1.00)
GFR calc Af Amer: 56 mL/min — ABNORMAL LOW (ref >60)
GFR calc non Af Amer: 49 mL/min — ABNORMAL LOW (ref >60)
GLUCOSE: 234 mg/dL — AB (ref 65–99)
Potassium: 3.8 mmol/L (ref 3.5–5.1)
Sodium: 140 mmol/L (ref 135–145)

## 2016-03-29 LAB — MAGNESIUM: Magnesium: 2 mg/dL (ref 1.7–2.4)

## 2016-03-29 MED ORDER — GABAPENTIN 300 MG PO CAPS
300.0000 mg | ORAL_CAPSULE | Freq: Two times a day (BID) | ORAL | Status: DC
  Administered 2016-03-29 – 2016-03-31 (×5): 300 mg via ORAL
  Filled 2016-03-29 (×5): qty 1

## 2016-03-29 MED ORDER — GLUCERNA SHAKE PO LIQD: 237.0000 mL | Freq: Every day | ORAL | Status: DC | PRN

## 2016-03-29 MED ORDER — FLUCONAZOLE 100 MG PO TABS
100.0000 mg | ORAL_TABLET | Freq: Every day | ORAL | Status: AC
  Administered 2016-03-30: 100 mg via ORAL
  Filled 2016-03-29: qty 1

## 2016-03-29 MED ORDER — AMLODIPINE BESYLATE 5 MG PO TABS
5.0000 mg | ORAL_TABLET | Freq: Every day | ORAL | Status: DC
  Administered 2016-03-30 – 2016-03-31 (×2): 5 mg via ORAL
  Filled 2016-03-29 (×2): qty 1

## 2016-03-29 MED ORDER — METFORMIN HCL ER 500 MG PO TB24
500.0000 mg | ORAL_TABLET | Freq: Every day | ORAL | Status: DC
  Administered 2016-03-30 – 2016-03-31 (×2): 500 mg via ORAL
  Filled 2016-03-29 (×2): qty 1

## 2016-03-29 NOTE — Progress Notes (Addendum)
PULMONARY / CRITICAL CARE MEDICINE   Name: [name] MRN: [id number] DOB: Jun 28, 1966    ADMISSION DATE:  03/27/2016 CONSULTATION DATE: 7/21  REFERRING MD:  Erin Hearing   CHIEF COMPLAINT:  Nausea, vomiting severe metabolic acidosis   HISTORY OF PRESENT ILLNESS:   This is a 50 yoF w/ sig h/o DM, ESRD s/p x-plant 2011 (on pred and cellcept). Presented to ED w/ cc: nausea, vomiting and worsening shortness of breath over 3-5d history. She denied sick exposure, did report fever and chills. Has had fairly painful red and extensive rash on her right groin (developed over last couple of days). No sig cough, ab abd pain, no dysuria or frequency. On presentation found to have: significant metabolic acidosis, Glucose > 700, and Acute on chronic renal failure. Will admit her w. Working dx of probable DKA, severe dehydration, AKI and possible sepsis, she has a central line we will cont aggressive volume resuscitation efforts.  -reports no change in meds -reports takes insulin as directed -glucose been reading "high for last couple of days"   SUBJECTIVE:  Taking PO Feels stronger, better  VITAL SIGNS: BP (!) 151/61   Pulse 82   Temp 99.5 F (37.5 C)   Resp 20   Ht 5\' 8"  (1.727 m)   Wt 104.3 kg (230 lb)   LMP 11/19/2011   SpO2 97%   BMI 34.97 kg/m   HEMODYNAMICS:    VENTILATOR SETTINGS:    INTAKE / OUTPUT: I/O last 3 completed shifts: In: 2974.2 [P.O.:480; I.V.:2144.2; IV Piggyback:350] Out: 1765 [Urine:1765]  PHYSICAL EXAMINATION: General:  No distress Neuro:  Awake, alert, no focal def  HEENT:  NCAT, MM are dry  Cardiovascular:  RRR w/out MRG  Lungs:  Decreased bilaterally. No accessory use  Abdomen:  Soft, not tender. No flank pain.  Musculoskeletal:  Equal st and bulk Skin:  Warm, right groin w/ damp v wet erythemic rash.   LABS:  BMET  Recent Labs Lab 03/28/16 0500 03/28/16 1800 03/29/16 0415  NA 138 139 140  K 3.8 3.6 3.8  CL 111 112* 115*  CO2 19* 19* 20*   BUN 35* 30* 25*  CREATININE 1.47* 1.40* 1.27*  GLUCOSE 139* 148* 234*    Electrolytes  Recent Labs Lab 03/27/16 1739  03/28/16 0105 03/28/16 0500 03/28/16 1800 03/29/16 0415  CALCIUM 8.4*  < > 8.3* 8.4* 9.0 8.7*  MG 1.4*  --  1.1*  --   --  2.0  PHOS 4.0  --  2.0*  --   --  2.2*  < > = values in this interval not displayed.  CBC  Recent Labs Lab 03/27/16 1739 03/28/16 0105 03/29/16 0415  WBC 2.4* 6.4 2.9*  HGB 11.1* 11.5* 11.3*  HCT 35.9* 35.1* 35.9*  PLT 190 178 147*    Coag's No results for input(s): APTT, INR in the last 168 hours.  Sepsis Markers  Recent Labs Lab 03/27/16 1446 03/27/16 1739  LATICACIDVEN 2.1* 2.4*    ABG No results for input(s): PHART, PCO2ART, PO2ART in the last 168 hours.  Liver Enzymes No results for input(s): AST, ALT, ALKPHOS, BILITOT, ALBUMIN in the last 168 hours.  Cardiac Enzymes  Recent Labs Lab 03/27/16 1131  TROPONINI 0.05*    Glucose  Recent Labs Lab 03/28/16 0753 03/28/16 0858 03/28/16 1025 03/28/16 1142 03/28/16 1715 03/29/16 0740  GLUCAP 147* 172* 189* 201* 124* 312*    Imaging No results found. cxr w/ left IJ in satisfactory postion.   STUDIES:  CULTURES: BCX2 7/21>>>> UC 7/21 >>> negative C diff 7/21 >. negative  ANTIBIOTICS: vanc 7/21>>> 7/22 Zosyn 7/21>>> 7/22 Diflucan 7/21>>> (plan thru 7/24)  SIGNIFICANT EVENTS:   LINES/TUBES: Left IJ CVL 9/20>>> 9/23  DISCUSSION: 50 year old female who presents w/ cc: N/V, dehydration, hyperglycemia, AKI and hypotension. Will admit w/ working dx of DKA and hypovolemic vs septic shock. Possible source if infection would be a cellulitis vs UT. Her right groin is very erythremic and painful. Will cont IVFs, cont IV bicarb infusion, cont insulin gtt.   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 Real finding: WBC 12.5 > 2.4 > 6.4 > 2.9 P:  Repeat CBC in am w diff, ? Whether WBC is accurate Cont Garden heparin   INFECTIOUS A:   SIRS r/o sepsis, no clear source identified Right groin candidal dermatitis  H/o bedbugs  P:   Contact isolation F/u culture data Wound care  d/c Vanc/zosyn on 7/22 and follow clinically, consider restart abx for cellulitis if she becomes febrile, etc.  Continue diflucan, topical nystatin for apparent candidal infxn R groin, last day 7/24  ENDOCRINE A:   DKA   hypothyroidism  P:   Cont home synthroid Ck TSH Transitioned off D50.45NS and insulin gtt 7/22 Restarted her home 70/30 insulin and SS regular Restart metformin 7/23  NEUROLOGIC A:   No acute  P:   RASS goal: 0 Restart Neurontin, hold of on flexeril for now  FAMILY  - Updates:   - Inter-disciplinary family meet or Palliative Care meeting due by:  7/30  Transition to floor status on 7/23.   Levy Pupa, MD, PhD 03/29/2016, 11:03 AM [facility] [phone] or if no answer [phone]

## 2016-03-29 NOTE — Evaluation (Signed)
Physical Therapy Evaluation Patient Details Name: [name] MRN: [id number] DOB: Feb 07, 1966 Today's Date: 03/29/2016   History of Present Illness  Pt admitted with N&V and severe metabolic acidosis.  Pt with hx of DM, kidney disease, kidney transplant and amputation of multiple toes bilat feet  Clinical Impression  Pt admitted as above and presenting with functional mobility limitations 2* generalized weakness and balance deficits.  Pt plans dc home with family assist and would benefit from use of RW for home use and HHPT follow up to further address deficits.    Follow Up Recommendations Home health PT    Equipment Recommendations  Rolling walker with 5" wheels    Recommendations for Other Services OT consult     Precautions / Restrictions Precautions Precautions: Fall Restrictions Weight Bearing Restrictions: No      Mobility  Bed Mobility Overal bed mobility: Modified Independent             General bed mobility comments: Pt to EOB unassisted  Transfers Overall transfer level: Needs assistance Equipment used: None Transfers: Sit to/from Stand Sit to Stand: Min assist         General transfer comment: cues for saftey in transition and min assist to rise and steady   Ambulation/Gait Ambulation/Gait assistance: +2 safety/equipment;Min assist;Mod assist Ambulation Distance (Feet): 160 Feet Assistive device: Rolling walker (2 wheeled);None Gait Pattern/deviations: Step-through pattern Gait velocity: decr Gait velocity interpretation: Below normal speed for age/gender General Gait Details: Attempted ambulation sans AD - pt very unstable and requiring mod assist avoid falling.  Pt ambulated with RW, min assist and cues for posture and position from RW.  Multple short standing rests required to complete task  Stairs            Wheelchair Mobility    Modified Rankin (Stroke Patients Only)       Balance Overall balance assessment: Needs  assistance Sitting-balance support: Feet supported;No upper extremity supported Sitting balance-Leahy Scale: Good     Standing balance support: No upper extremity supported Standing balance-Leahy Scale: Poor                               Pertinent Vitals/Pain Pain Assessment: No/denies pain    Home Living Family/patient expects to be discharged to:: Private residence Living Arrangements: Spouse/significant other;Children Available Help at Discharge: Family;Available PRN/intermittently Type of Home: House Home Access: Stairs to enter Entrance Stairs-Rails: Right;Can reach Technical sales engineer of Steps: 4 Home Layout: One level Home Equipment: Shower seat      Prior Function Level of Independence: Independent         Comments: Pt states ambulated IND and IND with ADL     Hand Dominance   Dominant Hand: Right    Extremity/Trunk Assessment   Upper Extremity Assessment: Generalized weakness           Lower Extremity Assessment: Generalized weakness      Cervical / Trunk Assessment: Normal  Communication   Communication: No difficulties  Cognition Arousal/Alertness: Awake/alert Behavior During Therapy: WFL for tasks assessed/performed Overall Cognitive Status: Within Functional Limits for tasks assessed                      General Comments      Exercises General Exercises - Lower Extremity Ankle Circles/Pumps: AROM;Both;15 reps;Supine      Assessment/Plan    PT Assessment Patient needs continued PT services  PT Diagnosis Difficulty walking  PT Problem List Decreased strength;Decreased activity tolerance;Decreased balance;Decreased mobility;Decreased knowledge of use of DME;Pain;Obesity;Decreased safety awareness  PT Treatment Interventions DME instruction;Gait training;Stair training;Functional mobility training;Therapeutic activities;Therapeutic exercise;Balance training;Patient/family education   PT Goals  (Current goals can be found in the Care Plan section) Acute Rehab PT Goals Patient Stated Goal: Regain IND and return home PT Goal Formulation: With patient Time For Goal Achievement: 04/11/16 Potential to Achieve Goals: Fair    Frequency Min 3X/week   Barriers to discharge        Co-evaluation               End of Session Equipment Utilized During Treatment: Gait belt Activity Tolerance: Patient tolerated treatment well;Patient limited by fatigue Patient left: in chair;with call bell/phone within reach;with chair alarm set Nurse Communication: Mobility status         Time: 1610-9604 PT Time Calculation (min) (ACUTE ONLY): 24 min   Charges:   PT Evaluation $PT Eval Low Complexity: 1 Procedure PT Treatments $Gait Training: 8-22 mins   PT G Codes:        Isaid Salvia 2016/04/06, 12:22 PM

## 2016-03-29 NOTE — Progress Notes (Signed)
Initial Nutrition Assessment  DOCUMENTATION CODES:   Morbid obesity  INTERVENTION:  1.  Diet education; RD provided "Carbohydrate Counting for People with Diabetes" handout from the Academy of Nutrition and Dietetics. Discussed different food groups and their effects on blood sugar, emphasizing carbohydrate-containing foods. Provided list of carbohydrates and recommended serving sizes of common foods. Discussed importance of controlled and consistent carbohydrate intake throughout the day. Provided examples of ways to balance meals/snacks and encouraged intake of high-fiber, whole grain complex carbohydrates. Teach back method used. 2.  Supplements; patient requests Glucerna shake "to boost my energy."  RD discussed with RN.  Will order Glucerna once daily PRN.   NUTRITION DIAGNOSIS:   Food and nutrition related knowledge deficit related to limited prior education as evidenced by per patient/family report.  GOAL:   Patient will meet greater than or equal to 90% of their needs  MONITOR:   PO intake, Supplement acceptance  REASON FOR ASSESSMENT:   Malnutrition Screening Tool    ASSESSMENT:   RD drawn to chart via MST screening.  Pt states she has variable weight history but is overall consistent with her usual.  Patient was admitted with concern for DKA vs. Sepsis.  Patient has been assessed several times in the past by RD staff and provided education materials.  Patient continues to endorse knowledge deficits related to diabetes care.  RD provided patient with Carb counting worksheet and reviewed sources of carbohydrates in diet with patient.  Patient was not initially able to identify sources of carbohydrate on her breakfast tray, however with teach back was able to restate and tell me the importance of managing blood sugars.  Patient will likely require ongoing education related to her diabetes care.  Note DM coordinator is also following along.   Diet Order:  Diet Carb Modified  Fluid consistency:: Thin; Room service appropriate?: Yes  Skin:  Wound (see comment)  Last BM:  7/23  Height:   Ht Readings from Last 1 Encounters:  03/27/16 5\' 8"  (1.727 m)    Weight:   Wt Readings from Last 1 Encounters:  03/27/16 230 lb (104.3 kg)    Ideal Body Weight:     BMI:  Body mass index is 34.97 kg/m.  Estimated Nutritional Needs:   Kcal:  1750-1900  Protein:  60g  Fluid:  2.0L  EDUCATION NEEDS:   Education needs addressed  Loyce Dys, MS RD LDN Clinical Inpatient Dietitian Weekend/After hours pager: [phone]

## 2016-03-30 LAB — GLUCOSE, CAPILLARY
GLUCOSE-CAPILLARY: 174 mg/dL — AB (ref 65–99)
Glucose-Capillary: 213 mg/dL — ABNORMAL HIGH (ref 65–99)
Glucose-Capillary: 289 mg/dL — ABNORMAL HIGH (ref 65–99)
Glucose-Capillary: 257 mg/dL — ABNORMAL HIGH (ref 65–99)

## 2016-03-30 LAB — PHOSPHORUS: PHOSPHORUS: 1.9 mg/dL — AB (ref 2.5–4.6)

## 2016-03-30 LAB — CBC
HCT: 35.8 % — ABNORMAL LOW (ref 36.0–46.0)
Hemoglobin: 11.7 g/dL — ABNORMAL LOW (ref 12.0–15.0)
MCH: 28.3 pg (ref 26.0–34.0)
MCHC: 32.7 g/dL (ref 30.0–36.0)
MCV: 86.7 fL (ref 78.0–100.0)
Platelets: 142 10*3/uL — ABNORMAL LOW (ref 150–400)
RBC: 4.13 MIL/uL (ref 3.87–5.11)
RDW: 14.7 % (ref 11.5–15.5)
WBC: 2.7 10*3/uL — ABNORMAL LOW (ref 4.0–10.5)

## 2016-03-30 LAB — BASIC METABOLIC PANEL
Anion gap: 7 (ref 5–15)
BUN: 18 mg/dL (ref 6–20)
CO2: 19 mmol/L — ABNORMAL LOW (ref 22–32)
Calcium: 8.9 mg/dL (ref 8.9–10.3)
Chloride: 112 mmol/L — ABNORMAL HIGH (ref 101–111)
Creatinine, Ser: 1.1 mg/dL — ABNORMAL HIGH (ref 0.44–1.00)
GFR calc Af Amer: >60 mL/min (ref >60)
GFR calc non Af Amer: 58 mL/min — ABNORMAL LOW (ref >60)
Glucose, Bld: 160 mg/dL — ABNORMAL HIGH (ref 65–99)
Potassium: 3.7 mmol/L (ref 3.5–5.1)
Sodium: 138 mmol/L (ref 135–145)

## 2016-03-30 LAB — MAGNESIUM: Magnesium: 1.6 mg/dL — ABNORMAL LOW (ref 1.7–2.4)

## 2016-03-30 MED ORDER — CARVEDILOL 25 MG PO TABS: 25.0000 mg | ORAL_TABLET | Freq: Two times a day (BID) | ORAL | 1 refills | Status: DC

## 2016-03-30 MED ORDER — POTASSIUM PHOSPHATES 15 MMOLE/5ML IV SOLN
40.0000 meq | Freq: Once | INTRAVENOUS | Status: AC
  Administered 2016-03-30: 40 meq via INTRAVENOUS
  Filled 2016-03-30: qty 9.09

## 2016-03-30 MED ORDER — NYSTATIN 100000 UNIT/GM EX CREA: TOPICAL_CREAM | CUTANEOUS | 0 refills | Status: DC

## 2016-03-30 MED ORDER — PHENOL 1.4 % MT LIQD
1.0000 | OROMUCOSAL | Status: DC | PRN
  Administered 2016-03-30: 1 via OROMUCOSAL
  Filled 2016-03-30: qty 177

## 2016-03-30 MED ORDER — MAGNESIUM SULFATE 2 GM/50ML IV SOLN
2.0000 g | Freq: Once | INTRAVENOUS | Status: AC
  Administered 2016-03-30: 2 g via INTRAVENOUS
  Filled 2016-03-30: qty 50

## 2016-03-30 NOTE — Progress Notes (Signed)
Inpatient Diabetes Program Recommendations  AACE/ADA: New Consensus Statement on Inpatient Glycemic Control (2015)  Target Ranges:  Prepandial:   less than 140 mg/dL      Peak postprandial:   less than 180 mg/dL (1-2 hours)      Critically ill patients:  140 - 180 mg/dL   Lab Results  Component Value Date   GLUCAP 213 (H) 03/30/2016   HGBA1C 11.7 (H) 11/02/2015    Review of Glycemic Control  Lunchtime blood sugar > 180 mg/dL Awaiting [id number] results.   Inpatient Diabetes Program Recommendations:    Increase Novolin 70/30 to 20 units bid - for discharge. Recommend Novolin R for sensitive sliding scale tidwc at home. F/U with PCP within 1 week for management of blood sugars. ? whether pt can go to [facility] since she missed appt.  Continue to follow. Thank you. Ailene Ards, RD, LDN, CDE Inpatient Diabetes Coordinator [phone]

## 2016-03-30 NOTE — Evaluation (Signed)
Occupational Therapy Evaluation Patient Details Name: [name] MRN: [id number] DOB: 04-02-1966 Today's Date: 03/30/2016    History of Present Illness Pt admitted with N&V and severe metabolic acidosis.  Pt with hx of DM, kidney disease, kidney transplant and amputation of multiple toes bilat feet   Clinical Impression   Pt admitted with N/V. Pt overall S- min A with ADL activity, family will A as needed    Follow Up Recommendations  No OT follow up    Equipment Recommendations  3 in 1 bedside comode    Recommendations for Other Services       Precautions / Restrictions        Mobility Bed Mobility Overal bed mobility: Modified Independent             General bed mobility comments: Pt to EOB unassisted  Transfers Overall transfer level: Needs assistance Equipment used: None   Sit to Stand: Supervision         General transfer comment: pt overall S- son will stand by A pt as needed    Balance                                            ADL Overall ADL's : Needs assistance/impaired                                       General ADL Comments: Pt overall S- min guard with ADL activity.  Educated in safety and fall prevention. Son will A as needed.  Pt feels ready to Dc this day     Vision     Perception     Praxis      Pertinent Vitals/Pain Pain Assessment: No/denies pain     Hand Dominance Right   Extremity/Trunk Assessment Upper Extremity Assessment Upper Extremity Assessment: Generalized weakness           Communication Communication Communication: No difficulties   Cognition Arousal/Alertness: Awake/alert Behavior During Therapy: WFL for tasks assessed/performed Overall Cognitive Status: Within Functional Limits for tasks assessed                                Home Living Family/patient expects to be discharged to:: Private residence Living Arrangements: Spouse/significant  other;Children Available Help at Discharge: Family;Available PRN/intermittently Type of Home: House Home Access: Stairs to enter [employer] of Steps: 4 Entrance Stairs-Rails: Right;Can reach both;Left Home Layout: One level               Home Equipment: Shower seat          Prior Functioning/Environment Level of Independence: Independent        Comments: Pt states ambulated IND and IND with ADL    OT Diagnosis:     OT Problem List:     OT Treatment/Interventions:      OT Goals(Current goals can be found in the care plan section) Acute Rehab OT Goals Patient Stated Goal: Regain IND and return home OT Goal Formulation: With patient  OT Frequency:      End of Session Nurse Communication: Mobility status  Activity Tolerance: Patient tolerated treatment well Patient left: in bed;with call bell/phone within reach   Time: 1572-6203 OT Time Calculation (min): 13 min  Charges:  OT General Charges $OT Visit: 1 Procedure OT Evaluation $OT Eval Low Complexity: 1 Procedure G-Codes:    Alba Cory 04/17/16, 3:36 PM

## 2016-03-30 NOTE — Progress Notes (Signed)
Date:  March 30, 2016 Chart reviewed for concurrent status and case management needs. Will continue to follow the patient for changes and needs:  dme order for rolling walker to [facility] dme representative Marcelle Smiling, BSN, [place], [place]   [phone]

## 2016-03-30 NOTE — Discharge Summary (Signed)
Physician Discharge Summary  Patient ID: [name] MRN: [id number] DOB/AGE: 49-17-1967 49 y.o.  Admit date: 03/27/2016 Discharge date: 03/31/2016    Discharge Diagnoses:  Dyspnea Secondary to Severe Acidosis  Hypovolemic Shock  ESRD / CKD III  Hx Prior Renal Transplant  Severe Hyperkalemia Nausea / Vomiting  Abdominal Pain  Right Groin Candidal Dermatitis  Hx of Bedbugs DKA  Diabetes Mellitus  Hypothyroidism                                                                      DISCHARGE PLAN BY DIAGNOSIS     DKA  Diabetes Mellitus Hypothyroidism  Discharge Plan: Increase 70/30 to 20 units BIDAC Continue Metformin 523m QD Follow up Hgb A1C  Dyspnea Secondary to Severe Acidosis   Discharge Plan: Resolved, no acute follow up indicated.   Hypovolemic Shock -  resolved, no acute follow up indicated.  Hx HTN  Discharge Plan: Continue norvasc, coreg Coreg restarted at half prior dose (25 mg BID), will need to review outpatient BP trend Follow up with PCP regarding blood pressure control   ESRD / CKD III  Hx Prior Renal Transplant  Severe Hyperkalemia Hx of Amputation in the setting of DM   Discharge Plan: Follow up BMP with primary MD  Follow up with transplant service as previously arranged  Home PT, RN, Aide and rolling walker arranged for discharge  Nausea / Vomiting  Abdominal Pain   Discharge Plan: Resolved, no acute follow up indicated.   Right Groin Candidal Dermatitis  Hx of Bedbugs Leukopenia   Discharge Plan: Continue nystatin cream Diflucan, 5 days total Follow up CBC with PCP                   DISCHARGE SUMMARY   [name] GIANNOTTIis a 50y.o. y/o female with a PMH of DM, ESRD s/p transplant in 2011 (on prednisone and cellcept who [facility] with complaints of  nausea, vomiting and worsening shortness of breath over 3-5d history. She denied sick exposures, reported fever and chills. She also reported a  fairly painful large red rash on her right groin (developed over last couple of days).   Initial work up concerning for severe metabolic acidosis, glucose > 700, and acute on chronic renal failure. The patient was admitted with DKA, severe dehydration, AKI and possible sepsis.  Central line was placed for aggressive volume resuscitation efforts and insulin gtt initiated.  The patient was initially treated with empiric antibiotics for possible sepsis.  These were quickly tapered as she had no identifiable source of infection and improved with fluids / insulin. Pan cultures to date are negative (C-diff, urine, blood). She was treated with diflucan for right groin candidal infection (will complete 5 days therapy + nystatin topical).  She transitioned out of ICU and home blood pressure regimen was restarted at half dose.  Electrolytes were replaced as indicated (k,mg,phos).  The diabetes coordinator evaluated the patients glucose trends while inpatient and made recommendations for 20 units BID at discharge.  The patient was evaluated by physical therapy and recommended for home PT, rolling waker.  Home RN, Aide ordered as well for discharge to assist with insulin regimen. She was medically  cleared for discharge 7/24 with plans as above.     CULTURES: BCX2 7/21 >> C-Diff PCR 7/21 >> negative UC 7/21 >> negative C diff 7/21 >> negative  ANTIBIOTICS: vanc 7/21 >> 7/22 Zosyn 7/21 >> 7/22 Diflucan 7/21 >> 7/25  SIGNIFICANT EVENTS: 7/21  Admit with n/v, dehydration, hyperglycemia, AKI, hypotension  LINES/TUBES: Left IJ CVL 9/20 >> 7/23   Discharge Exam: General: chronically ill appearing female in NAD, sitting up in bed  Neuro: AAOx4, speech clear, MAE CV: s1s2 rrr, no m/r/g, no jvd PULM: even/non-labored, lungs bilaterally clear GI: soft, non-tender, BSx4 active  Extremities: warm/dry, no edema  Vitals:   03/30/16 1010 03/30/16 1342 03/30/16 2200 03/31/16 0519  BP: (!) 149/85 (!) 158/81 (!)  153/78 (!) 150/83  Pulse: 80 79 78 84  Resp: '16 16 16 18  ' Temp: 98.1 F (36.7 C) 98.5 F (36.9 C) 98.3 F (36.8 C) 97.9 F (36.6 C)  TempSrc: Oral Oral Oral Oral  SpO2: 95% 94% 99% 99%  Weight:      Height:         Discharge Labs  BMET  Recent Labs Lab 03/27/16 1739  03/28/16 0105 03/28/16 0500 03/28/16 1800 03/29/16 0415 03/30/16 0652 03/31/16 0614  NA 138  < > 138 138 139 140 138 138  K 4.1  < > 3.6 3.8 3.6 3.8 3.7 4.0  CL 109  < > 111 111 112* 115* 112* 111  CO2 14*  < > 19* 19* 19* 20* 19* 22  GLUCOSE 331*  < > 161* 139* 148* 234* 160* 139*  BUN 43*  < > 39* 35* 30* 25* 18 11  CREATININE 1.93*  < > 1.76* 1.47* 1.40* 1.27* 1.10* 0.94  CALCIUM 8.4*  < > 8.3* 8.4* 9.0 8.7* 8.9 9.1  MG 1.4*  --  1.1*  --   --  2.0 1.6* 1.7  PHOS 4.0  --  2.0*  --   --  2.2* 1.9* 2.1*  < > = values in this interval not displayed.  CBC  Recent Labs Lab 03/29/16 0415 03/30/16 0652 03/31/16 0614  HGB 11.3* 11.7* 12.7  HCT 35.9* 35.8* 38.9  WBC 2.9* 2.7* 4.0  PLT 147* 142* 151     Follow-up Information    RICHTER,KAREN L., MD Follow up on 04/01/2016.   Specialty:  Family Medicine Why:  Appt at 3:00 PM  Contact information: [place] [address] [phone]              Medication List    STOP taking these medications   insulin regular 100 units/mL injection Commonly known as:  NOVOLIN R     TAKE these medications   acetaminophen 500 MG tablet Commonly known as:  TYLENOL Take 500-1,000 mg by mouth every 6 (six) hours as needed for headache.   amLODipine 5 MG tablet Commonly known as:  NORVASC Take 5 mg by mouth daily.   aspirin EC 81 MG tablet Take 81 mg by mouth daily.   carvedilol 25 MG tablet Commonly known as:  COREG Take 1 tablet (25 mg total) by mouth 2 (two) times daily. What changed:  how much to take   cyclobenzaprine 10 MG tablet Commonly known as:  FLEXERIL Take 10 mg by mouth daily.   gabapentin 300 MG  capsule Commonly known as:  NEURONTIN Take 300 mg by mouth 2 (two) times daily.   insulin NPH-regular Human (70-30) 100 UNIT/ML injection Commonly known as:  NOVOLIN 70/30 Inject  20 Units into the skin 2 (two) times daily with a meal. What changed:  how much to take   levothyroxine 137 MCG tablet Commonly known as:  SYNTHROID, LEVOTHROID Take 1 tablet (137 mcg total) by mouth daily. BRAND NAME ONLY   metFORMIN 500 MG 24 hr tablet Commonly known as:  GLUCOPHAGE-XR Take 500 mg by mouth daily.   mycophenolate 250 MG capsule Commonly known as:  CELLCEPT Take 500 mg by mouth 2 (two) times daily.   nystatin cream Commonly known as:  MYCOSTATIN Clean groin with soap and water.  Dry area then apply cream to site until healed.   predniSONE 5 MG tablet Commonly known as:  DELTASONE Take 5 mg by mouth daily.   ranitidine 150 MG capsule Commonly known as:  ZANTAC Take 150 mg by mouth every evening.   tacrolimus 1 MG capsule Commonly known as:  PROGRAF Take 1 mg by mouth 2 (two) times daily.         Disposition:  Home.  Home health PT, RN & Aide arranged for discharge.  Rolling walker arranged as well.   Discharged Condition: [name] has met maximum benefit of inpatient care and is medically stable and cleared for discharge.  Patient is pending follow up as above.      Time spent on disposition:  Greater than 35 minutes.   Signed: Noe Gens, NP-C [facility] Pgr: [phone] Office: [phone]

## 2016-03-31 LAB — CBC
HEMATOCRIT: 38.9 % (ref 36.0–46.0)
HEMOGLOBIN: 12.7 g/dL (ref 12.0–15.0)
MCH: 28.1 pg (ref 26.0–34.0)
MCHC: 32.6 g/dL (ref 30.0–36.0)
MCV: 86.1 fL (ref 78.0–100.0)
Platelets: 151 10*3/uL (ref 150–400)
RBC: 4.52 MIL/uL (ref 3.87–5.11)
RDW: 14.4 % (ref 11.5–15.5)
WBC: 4 10*3/uL (ref 4.0–10.5)

## 2016-03-31 LAB — BASIC METABOLIC PANEL
ANION GAP: 5 (ref 5–15)
BUN: 11 mg/dL (ref 6–20)
CALCIUM: 9.1 mg/dL (ref 8.9–10.3)
CO2: 22 mmol/L (ref 22–32)
Chloride: 111 mmol/L (ref 101–111)
Creatinine, Ser: 0.94 mg/dL (ref 0.44–1.00)
GFR calc non Af Amer: >60 mL/min (ref >60)
Glucose, Bld: 139 mg/dL — ABNORMAL HIGH (ref 65–99)
Potassium: 4 mmol/L (ref 3.5–5.1)
Sodium: 138 mmol/L (ref 135–145)

## 2016-03-31 LAB — PHOSPHORUS: PHOSPHORUS: 2.1 mg/dL — AB (ref 2.5–4.6)

## 2016-03-31 LAB — GLUCOSE, CAPILLARY
GLUCOSE-CAPILLARY: 207 mg/dL — AB (ref 65–99)
GLUCOSE-CAPILLARY: 142 mg/dL — AB (ref 65–99)
Glucose-Capillary: 160 mg/dL — ABNORMAL HIGH (ref 65–99)

## 2016-03-31 LAB — HEMOGLOBIN A1C
HEMOGLOBIN A1C: 12.3 % — AB (ref 4.8–5.6)
MEAN PLASMA GLUCOSE: 306 mg/dL

## 2016-03-31 LAB — MAGNESIUM: Magnesium: 1.7 mg/dL (ref 1.7–2.4)

## 2016-03-31 MED ORDER — DEXTROSE 5 % IV SOLN
20.0000 mmol | Freq: Once | INTRAVENOUS | Status: AC
  Administered 2016-03-31: 20 mmol via INTRAVENOUS
  Filled 2016-03-31: qty 6.67

## 2016-03-31 MED ORDER — MAGNESIUM SULFATE 2 GM/50ML IV SOLN
2.0000 g | Freq: Once | INTRAVENOUS | Status: AC
  Administered 2016-03-31: 2 g via INTRAVENOUS
  Filled 2016-03-31: qty 50

## 2016-03-31 MED ORDER — INSULIN NPH ISOPHANE & REGULAR (70-30) 100 UNIT/ML ~~LOC~~ SUSP: 20.0000 [IU] | Freq: Two times a day (BID) | SUBCUTANEOUS | 1 refills | Status: DC

## 2016-03-31 MED ORDER — DIPHENOXYLATE-ATROPINE 2.5-0.025 MG PO TABS
2.0000 | ORAL_TABLET | Freq: Once | ORAL | Status: AC
  Administered 2016-03-31: 2 via ORAL
  Filled 2016-03-31: qty 2

## 2016-03-31 NOTE — Progress Notes (Signed)
PULMONARY / CRITICAL CARE MEDICINE   Name: [name] MRN: [id number] DOB: 1965/09/14    ADMISSION DATE:  03/27/2016 CONSULTATION DATE: 7/21  REFERRING MD:  Erin Hearing   CHIEF COMPLAINT:  Nausea, vomiting severe metabolic acidosis   HISTORY OF PRESENT ILLNESS:   This is a 50 yoF w/ sig h/o DM, ESRD s/p x-plant 2011 (on pred and cellcept). Presented to ED w/ cc: nausea, vomiting and worsening shortness of breath over 3-5d history. She denied sick exposure, did report fever and chills. Has had fairly painful red and extensive rash on her right groin (developed over last couple of days). No sig cough, ab abd pain, no dysuria or frequency. On presentation found to have: significant metabolic acidosis, Glucose > 700, and Acute on chronic renal failure. Will admit her w. Working dx of probable DKA, severe dehydration, AKI and possible sepsis, she has a central line we will cont aggressive volume resuscitation efforts.  -reports no change in meds -reports takes insulin as directed -glucose been reading "high for last couple of days"   SUBJECTIVE:  Pt reports feeling weak / fatigued.  Feels she has enough support to go home with her family  VITAL SIGNS: BP (!) 153/78 (BP Location: Right Arm) Comment: RN Notified  Pulse 78   Temp 98.3 F (36.8 C) (Oral)   Resp 16   Ht 5\' 8"  (1.727 m)   Wt 230 lb (104.3 kg)   LMP 11/19/2011   SpO2 99%   BMI 34.97 kg/m   HEMODYNAMICS:    VENTILATOR SETTINGS:    INTAKE / OUTPUT: I/O last 3 completed shifts: In: 1290 [P.O.:1260; I.V.:30] Out: 2750 [Urine:2750]  PHYSICAL EXAMINATION: General: adult female in no acute distress Neuro:  Awake, alert, no focal def  HEENT:  NCAT, MM are dry  Cardiovascular:  RRR w/out MRG  Lungs:  Decreased bilaterally. No accessory use  Abdomen:  Soft, not tender. No flank pain.  Musculoskeletal:  Equal st and bulk Skin:  Warm, right groin with erythemic rash.   LABS:  BMET  Recent Labs Lab 03/28/16 1800  03/29/16 0415 03/30/16 0652  NA 139 140 138  K 3.6 3.8 3.7  CL 112* 115* 112*  CO2 19* 20* 19*  BUN 30* 25* 18  CREATININE 1.40* 1.27* 1.10*  GLUCOSE 148* 234* 160*    Electrolytes  Recent Labs Lab 03/28/16 0105  03/28/16 1800 03/29/16 0415 03/30/16 0652  CALCIUM 8.3*  < > 9.0 8.7* 8.9  MG 1.1*  --   --  2.0 1.6*  PHOS 2.0*  --   --  2.2* 1.9*  < > = values in this interval not displayed.  CBC  Recent Labs Lab 03/28/16 0105 03/29/16 0415 03/30/16 0652  WBC 6.4 2.9* 2.7*  HGB 11.5* 11.3* 11.7*  HCT 35.1* 35.9* 35.8*  PLT 178 147* 142*    Coag's No results for input(s): APTT, INR in the last 168 hours.  Sepsis Markers  Recent Labs Lab 03/27/16 1446 03/27/16 1739  LATICACIDVEN 2.1* 2.4*    ABG No results for input(s): PHART, PCO2ART, PO2ART in the last 168 hours.  Liver Enzymes No results for input(s): AST, ALT, ALKPHOS, BILITOT, ALBUMIN in the last 168 hours.  Cardiac Enzymes  Recent Labs Lab 03/27/16 1131  TROPONINI 0.05*    Glucose  Recent Labs Lab 03/29/16 1606 03/29/16 2112 03/30/16 0745 03/30/16 1206 03/30/16 1652 03/30/16 2203  GLUCAP 148* 133* 174* 213* 289* 257*    Imaging No results found.  STUDIES:  CULTURES: BCX2 7/21 >> UC 7/21 >> negative C diff 7/21 >> negative  ANTIBIOTICS: vanc 7/21 >> 7/22 Zosyn 7/21 >> 7/22 Diflucan 7/21 >> (plan thru 7/24)  SIGNIFICANT EVENTS:   LINES/TUBES: Left IJ CVL 9/20 >> 9/23  DISCUSSION: 50 year old female who presents w/ cc: N/V, dehydration, hyperglycemia, AKI and hypotension. Admit w/ working dx of DKA and hypovolemic vs septic shock. Possible source if infection would be a cellulitis vs UTI. Her right groin is very erythremic and painful. Treated with IVFs, cont IV bicarb infusion, cont insulin gtt with improvement.   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 Real finding: WBC 12.5 > 2.4 > 6.4 > 2.9 P:  Repeat CBC in am, ? Whether WBC is accurate Cont [place] heparin   INFECTIOUS A:   SIRS r/o sepsis, no clear source identified Right groin candidal dermatitis  H/o bedbugs  P:   Contact isolation F/u culture data Wound care  d/c Vanc/zosyn on 7/22 and follow clinically, consider restart abx for cellulitis if she becomes febrile, etc.  Continue diflucan x 5 days, topical nystatin for apparent candidal infxn R groin  ENDOCRINE A:   DKA   hypothyroidism  P:   Cont home synthroid Transitioned off D50.45NS and insulin gtt 7/22 Restarted her home 70/30 insulin and SS regular Restarted metformin 7/23  NEUROLOGIC A:   No acute  P:   RASS goal: 0 Restart Neurontin, hold of on flexeril for now  FAMILY  - Updates: Patient updated at bedside on plan of care.  Home health being set up for discharge.  Diabetes coordinator reviewing home regimen.    - Inter-disciplinary family meet or Palliative Care meeting due by:  7/30  Transition to floor status on 7/23.   Canary Brim, NP-C [facility] Pgr: [phone] or if no answer [phone] 03/31/2016, 5:04 AM

## 2016-03-31 NOTE — Progress Notes (Addendum)
Inpatient Diabetes Program Recommendations  AACE/ADA: New Consensus Statement on Inpatient Glycemic Control (2015)  Target Ranges:  Prepandial:   less than 140 mg/dL      Peak postprandial:   less than 180 mg/dL (1-2 hours)      Critically ill patients:  140 - 180 mg/dL   Results for [name], [name] (MRN [id number]) as of 03/31/2016 08:33  Ref. Range 11/02/2015 21:23 03/30/2016 06:25  Hemoglobin A1C Latest Ref Range: 4.8 - 5.6 % 11.7 (H) 12.3 (H)   Results for [name], [name] (MRN [id number]) as of 03/31/2016 08:33  Ref. Range 03/30/2016 07:45 03/30/2016 12:06 03/30/2016 16:52 03/30/2016 22:03  Glucose-Capillary Latest Ref Range: 65 - 99 mg/dL 979 (H) 892 (H) 119 (H) 257 (H)    Home DM Meds: 70/30 Insulin: 18 units bidwc       Metformin 500 daily  Current Insulin Orders: 70/30 Insulin: 18 units bidwc      Novolog Resistant Correction Scale/ SSI (0-20 units) TID AC + HS      Metformin 500 mg daily     -Current A1c shows continued poor glucose control at home.  -Still having glucose elevations here in hospital.    MD- Please consider increasing 70/30 Insulin to 20 units bidwc  Patient may also benefit from Sliding scale she could use at home when CBGs are elevated.    Please consider giving patient a Rx for Reli-On Regular Insulin ($25 per vial at [facility]).  Can have patient use the hospital Sensitive SSI regimen at home.      --Will follow patient during hospitalization--  Ambrose Finland RN, MSN, CDE Diabetes Coordinator Inpatient Glycemic Control Team Team Pager: [phone] (8a-5p)

## 2016-03-31 NOTE — Progress Notes (Signed)
Physical Therapy Treatment Patient Details Name: [name] MRN: [id number] DOB: 16-Aug-1966 Today's Date: 03/31/2016    History of Present Illness Pt admitted with N&V and severe metabolic acidosis.  Pt with hx of DM, kidney disease, kidney transplant and amputation of multiple toes bilat feet    PT Comments    The patient ambulated x 400' today. Plans DC today.  Follow Up Recommendations  Home health PT     Equipment Recommendations  Rolling walker with 5" wheels    Recommendations for Other Services       Precautions / Restrictions Precautions Precautions: Fall    Mobility  Bed Mobility Overal bed mobility: Independent                Transfers Overall transfer level: Needs assistance Equipment used: None Transfers: Stand Pivot Transfers;Sit to/from Stand   Stand pivot transfers: Supervision       General transfer comment: pivot to [facility]  Ambulation/Gait Ambulation/Gait assistance: Min guard Ambulation Distance (Feet): 400 Feet Assistive device:  (pushed IV pole) Gait Pattern/deviations: Step-through pattern;Staggering left;Staggering right     General Gait Details: patient had 1 balance loss when turning head to the right. Steadied herself/    Stairs            Wheelchair Mobility    Modified Rankin (Stroke Patients Only)       Balance           Standing balance support: No upper extremity supported;During functional activity Standing balance-Leahy Scale: Fair                      Cognition Arousal/Alertness: Awake/alert                          Exercises      General Comments        Pertinent Vitals/Pain Pain Assessment: No/denies pain    Home Living                      Prior Function            PT Goals (current goals can now be found in the care plan section) Progress towards PT goals: Progressing toward goals    Frequency  Min 3X/week    PT Plan Current plan remains  appropriate    Co-evaluation             End of Session   Activity Tolerance: Patient tolerated treatment well;Patient limited by fatigue Patient left: in bed;with call bell/phone within reach;with family/visitor present     Time: 4128-7867 PT Time Calculation (min) (ACUTE ONLY): 18 min  Charges:  $Gait Training: 8-22 mins                    G Codes:      Rada Hay 03/31/2016, 3:15 PM Blanchard Kelch PT [phone]

## 2016-03-31 NOTE — Care Management Important Message (Signed)
Important Message  Patient Details  Name: [name] MRN: [id number] Date of Birth: [birth year]   Medicare Important Message Given:  Yes    Haskell Flirt 03/31/2016, 11:36 AM

## 2016-03-31 NOTE — Progress Notes (Signed)
Nursing Discharge Summary  Patient ID: [name] MRN: [id number] DOB/AGE: Nov 13, 1965 50 y.o.  Admit date: 03/27/2016 Discharge date: 03/31/2016  Discharged Condition: good  Disposition: 01-Home or Self Care  Follow-up Information    Dois Davenport., MD Follow up on 04/01/2016.   Specialty:  Family Medicine Why:  Appt at 3:00 PM  Contact information: [address] [place] [address] [phone]           Prescriptions Given: Coreg prescription given.  70/30 Insulin and Nystatin cream called into [facility] on [address].  Patient follow up appointments and medications discussed and patient verbalized understanding without further questions.   Means of Discharge: Patient to be taken downstairs via wheelchair to be discharged home via private vehicle.   Signed: Gloriajean Dell 03/31/2016, 6:43 PM

## 2016-03-31 NOTE — Progress Notes (Signed)
Date:  March 31, 2016 Chart reviewed for concurrent status and case management needs. Will continue to follow the patient for changes and needs:  hhc and dme ordered through [facility] Expected discharge date: [id number] Marcelle Smiling, BSN, [place], [place]   [phone]

## 2016-04-01 LAB — CULTURE, BLOOD (ROUTINE X 2): CULTURE: NO GROWTH

## 2016-04-10 ENCOUNTER — Inpatient Hospital Stay (HOSPITAL_COMMUNITY)
Admission: EM | Admit: 2016-04-10 | Discharge: 2016-04-19 | DRG: 637 | Disposition: A | Discharge status: Home Health | Payer: Medicare Other | Attending: Internal Medicine | Admitting: Internal Medicine

## 2016-04-10 ENCOUNTER — Emergency Department (HOSPITAL_COMMUNITY): Payer: Medicare Other

## 2016-04-10 ENCOUNTER — Encounter (HOSPITAL_COMMUNITY): Payer: Self-pay | Admitting: Emergency Medicine

## 2016-04-10 ENCOUNTER — Observation Stay (HOSPITAL_COMMUNITY): Payer: Medicare Other

## 2016-04-10 DIAGNOSIS — Z4659 Encounter for fitting and adjustment of other gastrointestinal appliance and device: Secondary | ICD-10-CM

## 2016-04-10 DIAGNOSIS — Z7984 Long term (current) use of oral hypoglycemic drugs: Secondary | ICD-10-CM

## 2016-04-10 DIAGNOSIS — R4182 Altered mental status, unspecified: Secondary | ICD-10-CM | POA: Diagnosis present

## 2016-04-10 DIAGNOSIS — Z940 Kidney transplant status: Secondary | ICD-10-CM

## 2016-04-10 DIAGNOSIS — I10 Essential (primary) hypertension: Secondary | ICD-10-CM | POA: Diagnosis present

## 2016-04-10 DIAGNOSIS — L8990 Pressure ulcer of unspecified site, unspecified stage: Secondary | ICD-10-CM | POA: Insufficient documentation

## 2016-04-10 DIAGNOSIS — D849 Immunodeficiency, unspecified: Secondary | ICD-10-CM | POA: Diagnosis present

## 2016-04-10 DIAGNOSIS — M179 Osteoarthritis of knee, unspecified: Secondary | ICD-10-CM | POA: Diagnosis present

## 2016-04-10 DIAGNOSIS — E1010 Type 1 diabetes mellitus with ketoacidosis without coma: Principal | ICD-10-CM | POA: Diagnosis present

## 2016-04-10 DIAGNOSIS — Z7952 Long term (current) use of systemic steroids: Secondary | ICD-10-CM

## 2016-04-10 DIAGNOSIS — Z89412 Acquired absence of left great toe: Secondary | ICD-10-CM

## 2016-04-10 DIAGNOSIS — Z794 Long term (current) use of insulin: Secondary | ICD-10-CM

## 2016-04-10 DIAGNOSIS — G9340 Encephalopathy, unspecified: Secondary | ICD-10-CM

## 2016-04-10 DIAGNOSIS — Z87891 Personal history of nicotine dependence: Secondary | ICD-10-CM

## 2016-04-10 DIAGNOSIS — N179 Acute kidney failure, unspecified: Secondary | ICD-10-CM | POA: Diagnosis present

## 2016-04-10 DIAGNOSIS — M19029 Primary osteoarthritis, unspecified elbow: Secondary | ICD-10-CM | POA: Diagnosis present

## 2016-04-10 DIAGNOSIS — E860 Dehydration: Secondary | ICD-10-CM | POA: Diagnosis present

## 2016-04-10 DIAGNOSIS — E1022 Type 1 diabetes mellitus with diabetic chronic kidney disease: Secondary | ICD-10-CM | POA: Diagnosis present

## 2016-04-10 DIAGNOSIS — E039 Hypothyroidism, unspecified: Secondary | ICD-10-CM | POA: Diagnosis present

## 2016-04-10 DIAGNOSIS — E119 Type 2 diabetes mellitus without complications: Secondary | ICD-10-CM

## 2016-04-10 DIAGNOSIS — Z79899 Other long term (current) drug therapy: Secondary | ICD-10-CM

## 2016-04-10 DIAGNOSIS — F329 Major depressive disorder, single episode, unspecified: Secondary | ICD-10-CM | POA: Diagnosis present

## 2016-04-10 DIAGNOSIS — D899 Disorder involving the immune mechanism, unspecified: Secondary | ICD-10-CM

## 2016-04-10 DIAGNOSIS — Z89421 Acquired absence of other right toe(s): Secondary | ICD-10-CM

## 2016-04-10 DIAGNOSIS — R05 Cough: Secondary | ICD-10-CM

## 2016-04-10 DIAGNOSIS — Z7982 Long term (current) use of aspirin: Secondary | ICD-10-CM

## 2016-04-10 DIAGNOSIS — Z888 Allergy status to other drugs, medicaments and biological substances status: Secondary | ICD-10-CM

## 2016-04-10 DIAGNOSIS — T8612 Kidney transplant failure: Secondary | ICD-10-CM | POA: Diagnosis present

## 2016-04-10 DIAGNOSIS — E1159 Type 2 diabetes mellitus with other circulatory complications: Secondary | ICD-10-CM

## 2016-04-10 DIAGNOSIS — I4510 Unspecified right bundle-branch block: Secondary | ICD-10-CM | POA: Diagnosis present

## 2016-04-10 DIAGNOSIS — A419 Sepsis, unspecified organism: Secondary | ICD-10-CM | POA: Diagnosis present

## 2016-04-10 DIAGNOSIS — R059 Cough, unspecified: Secondary | ICD-10-CM

## 2016-04-10 DIAGNOSIS — N390 Urinary tract infection, site not specified: Secondary | ICD-10-CM | POA: Diagnosis present

## 2016-04-10 DIAGNOSIS — I129 Hypertensive chronic kidney disease with stage 1 through stage 4 chronic kidney disease, or unspecified chronic kidney disease: Secondary | ICD-10-CM | POA: Diagnosis present

## 2016-04-10 DIAGNOSIS — N183 Chronic kidney disease, stage 3 (moderate): Secondary | ICD-10-CM | POA: Diagnosis present

## 2016-04-10 DIAGNOSIS — IMO0001 Reserved for inherently not codable concepts without codable children: Secondary | ICD-10-CM

## 2016-04-10 DIAGNOSIS — Y830 Surgical operation with transplant of whole organ as the cause of abnormal reaction of the patient, or of later complication, without mention of misadventure at the time of the procedure: Secondary | ICD-10-CM | POA: Diagnosis present

## 2016-04-10 LAB — CBC WITH DIFFERENTIAL/PLATELET
Basophils Absolute: 0 10*3/uL (ref 0.0–0.1)
Basophils Relative: 0 %
EOS ABS: 0 10*3/uL (ref 0.0–0.7)
EOS PCT: 0 %
HCT: 41.8 % (ref 36.0–46.0)
Hemoglobin: 13 g/dL (ref 12.0–15.0)
LYMPHS ABS: 0.9 10*3/uL (ref 0.7–4.0)
Lymphocytes Relative: 9 %
MCH: 28 pg (ref 26.0–34.0)
MCHC: 31.1 g/dL (ref 30.0–36.0)
MCV: 90.1 fL (ref 78.0–100.0)
MONO ABS: 0.9 10*3/uL (ref 0.1–1.0)
Monocytes Relative: 9 %
Neutro Abs: 8 10*3/uL — ABNORMAL HIGH (ref 1.7–7.7)
Neutrophils Relative %: 82 %
PLATELETS: 232 10*3/uL (ref 150–400)
RBC: 4.64 MIL/uL (ref 3.87–5.11)
RDW: 14.7 % (ref 11.5–15.5)
WBC: 9.7 10*3/uL (ref 4.0–10.5)

## 2016-04-10 LAB — I-STAT ARTERIAL BLOOD GAS, ED
ACID-BASE DEFICIT: 2 mmol/L (ref 0.0–2.0)
Bicarbonate: 22.5 mEq/L (ref 20.0–24.0)
O2 SAT: 95 %
PH ART: 7.421 (ref 7.350–7.450)
Patient temperature: 98
TCO2: 24 mmol/L (ref 0–100)
pCO2 arterial: 34.5 mmHg — ABNORMAL LOW (ref 35.0–45.0)
pO2, Arterial: 74 mmHg — ABNORMAL LOW (ref 80.0–100.0)

## 2016-04-10 LAB — COMPREHENSIVE METABOLIC PANEL
ALT: 20 U/L (ref 14–54)
ANION GAP: 7 (ref 5–15)
AST: 17 U/L (ref 15–41)
Albumin: 2.9 g/dL — ABNORMAL LOW (ref 3.5–5.0)
Alkaline Phosphatase: 103 U/L (ref 38–126)
BUN: 22 mg/dL — ABNORMAL HIGH (ref 6–20)
CHLORIDE: 106 mmol/L (ref 101–111)
CO2: 25 mmol/L (ref 22–32)
Calcium: 9.6 mg/dL (ref 8.9–10.3)
Creatinine, Ser: 1.6 mg/dL — ABNORMAL HIGH (ref 0.44–1.00)
GFR calc non Af Amer: 37 mL/min — ABNORMAL LOW (ref >60)
GFR, EST AFRICAN AMERICAN: 43 mL/min — AB (ref >60)
Glucose, Bld: 89 mg/dL (ref 65–99)
POTASSIUM: 4.3 mmol/L (ref 3.5–5.1)
SODIUM: 138 mmol/L (ref 135–145)
Total Bilirubin: 0.4 mg/dL (ref 0.3–1.2)
Total Protein: 6.4 g/dL — ABNORMAL LOW (ref 6.5–8.1)

## 2016-04-10 LAB — URINALYSIS, ROUTINE W REFLEX MICROSCOPIC
Bilirubin Urine: NEGATIVE
Glucose, UA: NEGATIVE mg/dL
Ketones, ur: NEGATIVE mg/dL
LEUKOCYTES UA: NEGATIVE
NITRITE: NEGATIVE
SPECIFIC GRAVITY, URINE: 1.021 (ref 1.005–1.030)
pH: 5.5 (ref 5.0–8.0)

## 2016-04-10 LAB — CREATININE, SERUM
Creatinine, Ser: 1.3 mg/dL — ABNORMAL HIGH (ref 0.44–1.00)
GFR calc non Af Amer: 47 mL/min — ABNORMAL LOW (ref >60)
GFR, EST AFRICAN AMERICAN: 55 mL/min — AB (ref >60)

## 2016-04-10 LAB — I-STAT CHEM 8, ED
BUN: 24 mg/dL — ABNORMAL HIGH (ref 6–20)
CALCIUM ION: 1.25 mmol/L (ref 1.13–1.30)
Chloride: 104 mmol/L (ref 101–111)
Creatinine, Ser: 1.5 mg/dL — ABNORMAL HIGH (ref 0.44–1.00)
Glucose, Bld: 85 mg/dL (ref 65–99)
HEMATOCRIT: 40 % (ref 36.0–46.0)
HEMOGLOBIN: 13.6 g/dL (ref 12.0–15.0)
Potassium: 4.2 mmol/L (ref 3.5–5.1)
SODIUM: 141 mmol/L (ref 135–145)
TCO2: 26 mmol/L (ref 0–100)

## 2016-04-10 LAB — AMMONIA: Ammonia: 28 umol/L (ref 9–35)

## 2016-04-10 LAB — URINE MICROSCOPIC-ADD ON

## 2016-04-10 LAB — CBC
HCT: 39.4 % (ref 36.0–46.0)
Hemoglobin: 12.1 g/dL (ref 12.0–15.0)
MCH: 27.5 pg (ref 26.0–34.0)
MCHC: 30.7 g/dL (ref 30.0–36.0)
MCV: 89.5 fL (ref 78.0–100.0)
PLATELETS: 216 10*3/uL (ref 150–400)
RBC: 4.4 MIL/uL (ref 3.87–5.11)
RDW: 14.5 % (ref 11.5–15.5)
WBC: 9.4 10*3/uL (ref 4.0–10.5)

## 2016-04-10 LAB — I-STAT CG4 LACTIC ACID, ED: LACTIC ACID, VENOUS: 1.69 mmol/L (ref 0.5–1.9)

## 2016-04-10 LAB — CBG MONITORING, ED
GLUCOSE-CAPILLARY: 93 mg/dL (ref 65–99)
GLUCOSE-CAPILLARY: 190 mg/dL — AB (ref 65–99)

## 2016-04-10 LAB — PROTIME-INR
INR: 0.94
PROTHROMBIN TIME: 12.6 s (ref 11.4–15.2)

## 2016-04-10 LAB — GLUCOSE, CAPILLARY
GLUCOSE-CAPILLARY: 191 mg/dL — AB (ref 65–99)
GLUCOSE-CAPILLARY: 301 mg/dL — AB (ref 65–99)
GLUCOSE-CAPILLARY: 281 mg/dL — AB (ref 65–99)
Glucose-Capillary: 162 mg/dL — ABNORMAL HIGH (ref 65–99)

## 2016-04-10 LAB — RAPID URINE DRUG SCREEN, HOSP PERFORMED
AMPHETAMINES: NOT DETECTED
BARBITURATES: NOT DETECTED
Benzodiazepines: NOT DETECTED
Cocaine: NOT DETECTED
OPIATES: NOT DETECTED
TETRAHYDROCANNABINOL: NOT DETECTED

## 2016-04-10 LAB — TSH: TSH: 1.824 u[IU]/mL (ref 0.350–4.500)

## 2016-04-10 LAB — TROPONIN I: Troponin I: 0.03 ng/mL (ref <0.03)

## 2016-04-10 MED ORDER — CARVEDILOL 25 MG PO TABS
25.0000 mg | ORAL_TABLET | Freq: Two times a day (BID) | ORAL | Status: DC
  Administered 2016-04-11 – 2016-04-19 (×16): 25 mg via ORAL
  Filled 2016-04-10 (×16): qty 1

## 2016-04-10 MED ORDER — FAMOTIDINE 20 MG PO TABS
20.0000 mg | ORAL_TABLET | Freq: Every day | ORAL | Status: DC
  Administered 2016-04-11 – 2016-04-19 (×9): 20 mg via ORAL
  Filled 2016-04-10 (×9): qty 1

## 2016-04-10 MED ORDER — DEXTROSE-NACL 5-0.45 % IV SOLN
INTRAVENOUS | Status: DC
  Administered 2016-04-10: 75 mL via INTRAVENOUS
  Administered 2016-04-11: 1000 mL via INTRAVENOUS

## 2016-04-10 MED ORDER — CYCLOBENZAPRINE HCL 10 MG PO TABS: 10.0000 mg | ORAL_TABLET | Freq: Every day | ORAL | Status: DC

## 2016-04-10 MED ORDER — SODIUM CHLORIDE 0.9 % IV BOLUS (SEPSIS)
1000.0000 mL | Freq: Once | INTRAVENOUS | Status: AC
  Administered 2016-04-10: 1000 mL via INTRAVENOUS

## 2016-04-10 MED ORDER — ONDANSETRON HCL 4 MG/2ML IJ SOLN
INTRAMUSCULAR | Status: AC
  Filled 2016-04-10: qty 2

## 2016-04-10 MED ORDER — POLYETHYLENE GLYCOL 3350 17 G PO PACK: 17.0000 g | PACK | Freq: Every day | ORAL | Status: DC | PRN

## 2016-04-10 MED ORDER — ONDANSETRON HCL 4 MG/2ML IJ SOLN
4.0000 mg | Freq: Four times a day (QID) | INTRAMUSCULAR | Status: DC | PRN
  Administered 2016-04-13: 4 mg via INTRAVENOUS
  Filled 2016-04-10: qty 2

## 2016-04-10 MED ORDER — ALBUTEROL SULFATE (2.5 MG/3ML) 0.083% IN NEBU
2.5000 mg | INHALATION_SOLUTION | RESPIRATORY_TRACT | Status: DC | PRN
  Administered 2016-04-13: 2.5 mg via RESPIRATORY_TRACT
  Filled 2016-04-10: qty 3

## 2016-04-10 MED ORDER — HYDRALAZINE HCL 20 MG/ML IJ SOLN
10.0000 mg | Freq: Four times a day (QID) | INTRAMUSCULAR | Status: DC | PRN
  Administered 2016-04-10 – 2016-04-18 (×5): 10 mg via INTRAVENOUS
  Filled 2016-04-10 (×5): qty 1

## 2016-04-10 MED ORDER — TACROLIMUS 1 MG PO CAPS
1.0000 mg | ORAL_CAPSULE | Freq: Two times a day (BID) | ORAL | Status: DC
  Administered 2016-04-11 – 2016-04-19 (×16): 1 mg via ORAL
  Filled 2016-04-10 (×21): qty 1

## 2016-04-10 MED ORDER — MYCOPHENOLATE MOFETIL 250 MG PO CAPS
500.0000 mg | ORAL_CAPSULE | Freq: Two times a day (BID) | ORAL | Status: DC
Start: 2016-04-10 — End: 2016-04-19
  Administered 2016-04-11 – 2016-04-19 (×17): 500 mg via ORAL
  Filled 2016-04-10 (×17): qty 2

## 2016-04-10 MED ORDER — SODIUM CHLORIDE 0.9 % IV SOLN: INTRAVENOUS | Status: AC

## 2016-04-10 MED ORDER — SODIUM CHLORIDE 0.9% FLUSH: 3.0000 mL | INTRAVENOUS | Status: DC | PRN

## 2016-04-10 MED ORDER — GLUCERNA SHAKE PO LIQD: 237.0000 mL | Freq: Every day | ORAL | Status: DC | PRN

## 2016-04-10 MED ORDER — NYSTATIN 100000 UNIT/GM EX CREA
TOPICAL_CREAM | Freq: Two times a day (BID) | CUTANEOUS | Status: DC
  Administered 2016-04-10 – 2016-04-13 (×4): via TOPICAL
  Administered 2016-04-13: 1 via TOPICAL
  Administered 2016-04-14: 13:00:00 via TOPICAL
  Administered 2016-04-14: 1 via TOPICAL
  Administered 2016-04-15 – 2016-04-19 (×7): via TOPICAL
  Filled 2016-04-10 (×3): qty 15

## 2016-04-10 MED ORDER — PREDNISONE 5 MG PO TABS
5.0000 mg | ORAL_TABLET | Freq: Every day | ORAL | Status: DC
  Administered 2016-04-11 – 2016-04-19 (×9): 5 mg via ORAL
  Filled 2016-04-10 (×3): qty 1
  Filled 2016-04-10: qty 0.5
  Filled 2016-04-10: qty 1
  Filled 2016-04-10: qty 0.5
  Filled 2016-04-10 (×4): qty 1

## 2016-04-10 MED ORDER — TRAZODONE HCL 50 MG PO TABS: 50.0000 mg | ORAL_TABLET | Freq: Every evening | ORAL | Status: DC | PRN

## 2016-04-10 MED ORDER — SODIUM CHLORIDE 0.9 % IV SOLN: 250.0000 mL | INTRAVENOUS | Status: DC | PRN

## 2016-04-10 MED ORDER — SENNA 8.6 MG PO TABS
1.0000 | ORAL_TABLET | Freq: Two times a day (BID) | ORAL | Status: DC
  Administered 2016-04-11: 8.6 mg via ORAL
  Filled 2016-04-10 (×2): qty 1

## 2016-04-10 MED ORDER — LEVOTHYROXINE SODIUM 25 MCG PO TABS
137.0000 ug | ORAL_TABLET | Freq: Every day | ORAL | Status: DC
  Administered 2016-04-11 – 2016-04-19 (×9): 137 ug via ORAL
  Filled 2016-04-10 (×10): qty 1

## 2016-04-10 MED ORDER — SODIUM CHLORIDE 0.9% FLUSH
3.0000 mL | Freq: Two times a day (BID) | INTRAVENOUS | Status: DC
  Administered 2016-04-11 – 2016-04-19 (×13): 3 mL via INTRAVENOUS

## 2016-04-10 MED ORDER — GABAPENTIN 300 MG PO CAPS: 300.0000 mg | ORAL_CAPSULE | Freq: Two times a day (BID) | ORAL | Status: DC

## 2016-04-10 MED ORDER — ONDANSETRON HCL 4 MG PO TABS: 4.0000 mg | ORAL_TABLET | Freq: Four times a day (QID) | ORAL | Status: DC | PRN

## 2016-04-10 MED ORDER — ACETAMINOPHEN 650 MG RE SUPP
650.0000 mg | Freq: Four times a day (QID) | RECTAL | Status: DC | PRN
  Administered 2016-04-10 – 2016-04-11 (×3): 650 mg via RECTAL
  Filled 2016-04-10 (×3): qty 1

## 2016-04-10 MED ORDER — ACETAMINOPHEN 325 MG PO TABS
650.0000 mg | ORAL_TABLET | Freq: Four times a day (QID) | ORAL | Status: DC | PRN
  Administered 2016-04-13 – 2016-04-19 (×9): 650 mg via ORAL
  Filled 2016-04-10 (×10): qty 2

## 2016-04-10 MED ORDER — INSULIN ASPART 100 UNIT/ML ~~LOC~~ SOLN
0.0000 [IU] | Freq: Three times a day (TID) | SUBCUTANEOUS | Status: DC
  Administered 2016-04-10: 7 [IU] via SUBCUTANEOUS
  Administered 2016-04-11: 5 [IU] via SUBCUTANEOUS

## 2016-04-10 MED ORDER — SODIUM CHLORIDE 0.9% FLUSH
3.0000 mL | Freq: Two times a day (BID) | INTRAVENOUS | Status: DC
  Administered 2016-04-11 – 2016-04-19 (×10): 3 mL via INTRAVENOUS

## 2016-04-10 MED ORDER — LABETALOL HCL 5 MG/ML IV SOLN
10.0000 mg | INTRAVENOUS | Status: DC | PRN
  Filled 2016-04-10: qty 4

## 2016-04-10 MED ORDER — ASPIRIN EC 81 MG PO TBEC
81.0000 mg | DELAYED_RELEASE_TABLET | Freq: Every day | ORAL | Status: DC
  Administered 2016-04-13 – 2016-04-19 (×7): 81 mg via ORAL
  Filled 2016-04-10 (×7): qty 1

## 2016-04-10 MED ORDER — ONDANSETRON HCL 4 MG/2ML IJ SOLN
4.0000 mg | Freq: Once | INTRAMUSCULAR | Status: AC
  Administered 2016-04-10: 4 mg via INTRAVENOUS

## 2016-04-10 MED ORDER — HEPARIN SODIUM (PORCINE) 5000 UNIT/ML IJ SOLN: 5000.0000 [IU] | Freq: Three times a day (TID) | INTRAMUSCULAR | Status: DC

## 2016-04-10 MED ORDER — AMLODIPINE BESYLATE 5 MG PO TABS: 5.0000 mg | ORAL_TABLET | Freq: Every day | ORAL | Status: DC

## 2016-04-10 NOTE — ED Triage Notes (Signed)
Patient arrives via EMS from unknown location. Per EMS they were called for hypoglycemia. Upon arrival EMS found patient disoriented with blood glucose @ 96. Per EMS they were told that patient normally chronically has blood glucose readings in the 300s. Patient vomited food twice PTA. EMS attempted IV but unsuccessful; 4mg  zofran given IM. Additionally complaining of headache PTA and on arrival.

## 2016-04-10 NOTE — Care Management Note (Signed)
Case Management Note  Patient Details  Name: [name] MRN: [id number] Date of Birth: [birth year]  Subjective/Objective:                 Patient from home with husband Casimiro Needle. Has medicare, PCP Dr Hal Hope, no endocrinologist, uses [facility] [address], Both husband and patiet drive. Patient uses the [employer] meter from [facility] along with supplies and checks her sugar 3 or more times a day. Husband describes patient as brittle diabetic. Patient has walker, no other DME. Not active with HH.    Action/Plan:   Expected Discharge Date:                  Expected Discharge Plan:  Home w Home Health Services  In-House Referral:     Discharge planning Services  CM Consult  Post Acute Care Choice:    Choice offered to:     DME Arranged:    DME Agency:     HH Arranged:    HH Agency:     Status of Service:  In process, will continue to follow  If discussed at Long Length of Stay Meetings, dates discussed:    Additional Comments:  Lawerance Sabal, RN 04/10/2016, 2:28 PM

## 2016-04-10 NOTE — Progress Notes (Addendum)
Inpatient Diabetes Program Recommendations  AACE/ADA: New Consensus Statement on Inpatient Glycemic Control (2015)  Target Ranges:  Prepandial:   less than 140 mg/dL      Peak postprandial:   less than 180 mg/dL (1-2 hours)      Critically ill patients:  140 - 180 mg/dL   Results for [name], [name] (MRN [id number]) as of 04/10/2016 11:37  Ref. Range 04/10/2016 02:39 04/10/2016 07:41 04/10/2016 10:45  Glucose-Capillary Latest Ref Range: 65 - 99 mg/dL 93 381 (H) 829 (H)    Review of Glycemic Control  Diabetes history: DM2 Outpatient Diabetes medications: 70/30 insulin 20 BID, Metformin 500 QD Current orders for Inpatient glycemic control: Novolog correction 0-9 units tid  Inpatient Diabetes Program Recommendations:  Spoke with patient who shares that she has been taking her 70/30 insulin 20 units ac breakfast and supper. However, patient did not eat much the today her blood sugar dropped. Patient Is not feeling well so kept visit short. Patient may benefit from transition to Lantus insulin but noted was not able to afford from admission 11/05/15. Placed consult to case mgt. To determine if Lantus or Levemir may now be covered.  Thank you, Billy Fischer. Catheryn Slifer, RN, MSN, CDE Inpatient Glycemic Control Team Team Pager [phone] (8am-5pm) 04/10/2016 11:47 AM

## 2016-04-10 NOTE — ED Provider Notes (Signed)
[facility]-EMERGENCY DEPT Provider Note   CSN: [id number] Arrival date & time: 04/10/16  0216  By signing my name below, I, Phillis Haggis, attest that this documentation has been prepared under the direction and in the presence of Glynn Octave, MD. Electronically Signed: Phillis Haggis, ED Scribe. 04/10/16. 2:42 AM.  First Provider Contact:  First MD Initiated Contact with Patient 04/10/16 0227     History   Chief Complaint No chief complaint on file.   The history is provided by the EMS personnel. No language interpreter was used.  HPI Comments (Level 5 Caveat due to mental status change): [name] is a 50 y.o. Female with a hx of ESRD, headache, HTN, immunosuppression, and type II DM brought in by EMS who presents to the Emergency Department complaining of AMS onset PTA. Per EMS, pt is from home and her initial call to EMS was for hypoglycemia. Pt was found to have a CBG of 90 and altered at EMS arrival. EMS states that pt is typically hyperglycemia in the 300s. Pt is complaining of headache and states that "I feel sick."  She also reports that she is very thirsty. Pt has multiple bruises to the abdomen but cannot recall how she got them. Pt cannot remember who she lives with or when she received her kidney transplant. She denies sick contacts, SOB, vomiting, diarrhea, abdominal pain, chest pain, fever, fall, or LOC.    Past Medical History:  Diagnosis Date  . Arthritis    "elbows, knees, legs, back" (09/24/2015)  . Daily headache   . Depression    "years ago"  . End stage renal disease (HCC)    right arm AV graft, post transplant  . Hypertension   . Hypothyroid   . 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 Kidney disease   . Pneumonia ~ 2007?  [name] Type II diabetes mellitus (HCC)    Insulin dependant    Patient Active Problem List   Diagnosis Date Noted  . DKA (diabetic ketoacidoses) (HCC) 11/02/2015  . Hyperglycemia without ketosis 09/24/2015  . CKD  (chronic kidney disease) stage 2, GFR 60-89 ml/min 09/24/2015  . History of renal transplant 09/24/2015  . Uncontrolled type 1 diabetes mellitus with foot ulcer (HCC) 06/04/2015  . History of renal transplantation 06/04/2015  . Foot ulcer, right (HCC)   . Foot abscess, right   . Diarrhea 06/02/2015  . Diabetic osteomyelitis (HCC) 06/02/2015  . Type 1 diabetes mellitus with diabetic foot ulcer (HCC) 06/02/2015  . Acute renal failure superimposed on stage 3 chronic kidney disease (HCC) 06/02/2015  . Diabetic foot infection (HCC) 06/01/2015  . Essential hypertension 06/01/2015  . GERD (gastroesophageal reflux disease) 06/01/2015  . End stage renal disease (HCC) 10/24/2012  . H/O kidney transplant 08/04/2012  . High anion gap metabolic acidosis 08/04/2012  . DKA, type 1 (HCC) 09/22/2011  . Hyperkalemia 09/22/2011  . Immunosuppression (HCC)   . Hypothyroidism     Past Surgical History:  Procedure Laterality Date  . AMPUTATION Left 05/11/2014   Procedure: AMPUTATION LEFT GREAT TOE;  Surgeon: Kathryne Hitch, MD;  Location: [facility];  Service: Orthopedics;  Laterality: Left;  . AV FISTULA PLACEMENT Right    forearm  . BACK SURGERY    . CATARACT EXTRACTION W/ INTRAOCULAR LENS  IMPLANT, BILATERAL Bilateral   . CESAREAN SECTION  07/1999  . DG AV DIALYSIS GRAFT DECLOT OR    . INCISION AND DRAINAGE Right 06/02/2015   Procedure: INCISION AND DRAINAGE  OF RIGHT 3rd RAY RESECTION;  Surgeon: Kathryne Hitch, MD;  Location: [facility] OR;  Service: Orthopedics;  Laterality: Right;  . 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 LUMBAR DISC SURGERY  2001  . NEPHRECTOMY TRANSPLANTED ORGAN    . TOE AMPUTATION Right    1,2 & 3rd toes.  . TUBAL LIGATION  07/1999    OB History    No data available       Home Medications    Prior to Admission medications   Medication Sig Start Date End Date Taking? Authorizing Provider  acetaminophen (TYLENOL) 500 MG tablet Take 500-1,000 mg by mouth every 6  (six) hours as needed for headache.    Historical Provider, MD  amLODipine (NORVASC) 5 MG tablet Take 5 mg by mouth daily. 02/28/16   Historical Provider, MD  aspirin EC 81 MG tablet Take 81 mg by mouth daily.    Historical Provider, MD  carvedilol (COREG) 25 MG tablet Take 1 tablet (25 mg total) by mouth 2 (two) times daily. 03/30/16   Jeanella Craze, NP  cyclobenzaprine (FLEXERIL) 10 MG tablet Take 10 mg by mouth daily. 02/13/16   Historical Provider, MD  gabapentin (NEURONTIN) 300 MG capsule Take 300 mg by mouth 2 (two) times daily. 02/13/16   Historical Provider, MD  insulin NPH-regular Human (NOVOLIN 70/30) (70-30) 100 UNIT/ML injection Inject 20 Units into the skin 2 (two) times daily with a meal. 03/31/16   Jeanella Craze, NP  levothyroxine (SYNTHROID, LEVOTHROID) 137 MCG tablet Take 1 tablet (137 mcg total) by mouth daily. BRAND NAME ONLY 03/04/15   Alison Murray, MD  metFORMIN (GLUCOPHAGE-XR) 500 MG 24 hr tablet Take 500 mg by mouth daily. 01/16/16   Historical Provider, MD  mycophenolate (CELLCEPT) 250 MG capsule Take 500 mg by mouth 2 (two) times daily.     Historical Provider, MD  nystatin cream (MYCOSTATIN) Clean groin with soap and water.  Dry area then apply cream to site until healed. 03/30/16   Jeanella Craze, NP  predniSONE (DELTASONE) 5 MG tablet Take 5 mg by mouth daily. 05/29/15   Historical Provider, MD  ranitidine (ZANTAC) 150 MG capsule Take 150 mg by mouth every evening.     Historical Provider, MD  tacrolimus (PROGRAF) 1 MG capsule Take 1 mg by mouth 2 (two) times daily.     Historical Provider, MD    Family History Family History  Problem Relation Age of Onset  . Emphysema Mother   . Throat cancer Mother   . COPD Mother   . Cancer Mother   . Emphysema Father   . COPD Father   . Stroke Father   . ADD / ADHD Son     Social History Social History  Substance Use Topics  . Smoking status: Former Smoker    Packs/day: 1.00    Years: 11.00    Types: Cigarettes    Quit  date: 09/21/1998  . Smokeless tobacco: Never Used  . Alcohol use No     Allergies   Ace inhibitors and Adhesive [tape]   Review of Systems Review of Systems  Unable to perform ROS: Mental status change     Physical Exam Updated Vital Signs LMP 11/19/2011   Physical Exam  Constitutional: She appears well-developed and well-nourished. No distress.  HENT:  Head: Normocephalic and atraumatic.  Mouth/Throat: Oropharynx is clear and moist. No oropharyngeal exudate.  Dried blood to right nare  Eyes: Conjunctivae and EOM are normal. Pupils are equal,  round, and reactive to light.  Neck: Normal range of motion. Neck supple.  No meningismus.  Cardiovascular: Normal rate, regular rhythm, normal heart sounds and intact distal pulses.   No murmur heard. Pulmonary/Chest: Effort normal and breath sounds normal. No respiratory distress.  Abdominal: Soft. There is no tenderness. There is no rebound and no guarding.  multiple areas of ecchymosis across lower abdomen  Musculoskeletal: Normal range of motion. She exhibits no edema or tenderness.  Bilateral toe amputations that are well healing  Neurological: She is alert. No cranial nerve deficit. She exhibits normal muscle tone. Coordination normal.  Oriented x1. 5/5 strength throughout. CN 2-12 intact.Equal grip strength. Follows commands but is confused  Skin: Skin is warm.  Psychiatric: She has a normal mood and affect. Her behavior is normal.  Nursing note and vitals reviewed.    ED Treatments / Results  DIAGNOSTIC STUDIES: Oxygen Saturation is 99% on RA, normal by my interpretation.    COORDINATION OF CARE: 2:32 AM-labs  Labs (all labs ordered are listed, but only abnormal results are displayed) Labs Reviewed  CBC WITH DIFFERENTIAL/PLATELET - Abnormal; Notable for the following:       Result Value   Neutro Abs 8.0 (*)    All other components within normal limits  COMPREHENSIVE METABOLIC PANEL - Abnormal; Notable for the  following:    BUN 22 (*)    Creatinine, Ser 1.60 (*)    Total Protein 6.4 (*)    Albumin 2.9 (*)    GFR calc non Af Amer 37 (*)    GFR calc Af Amer 43 (*)    All other components within normal limits  TROPONIN I - Abnormal; Notable for the following:    Troponin I 0.03 (*)    All other components within normal limits  I-STAT ARTERIAL BLOOD GAS, ED - Abnormal; Notable for the following:    pCO2 arterial 34.5 (*)    pO2, Arterial 74.0 (*)    All other components within normal limits  I-STAT CHEM 8, ED - Abnormal; Notable for the following:    BUN 24 (*)    Creatinine, Ser 1.50 (*)    All other components within normal limits  CULTURE, BLOOD (ROUTINE X 2)  CULTURE, BLOOD (ROUTINE X 2)  URINE CULTURE  PROTIME-INR  URINALYSIS, ROUTINE W REFLEX MICROSCOPIC (NOT AT [facility])  CBG MONITORING, ED  I-STAT CG4 LACTIC ACID, ED  CBG MONITORING, ED    EKG  EKG Interpretation  Date/Time:  Friday April 10 2016 02:31:20 EDT Ventricular Rate:  92 PR Interval:    QRS Duration: 123 QT Interval:  396 QTC Calculation: 490 R Axis:   -149 Text Interpretation:  Sinus arrhythmia Right bundle branch block stable ST depression Confirmed by Manus Gunning  MD, Jahlani Lorentz ([id number]) on 04/10/2016 2:52:19 AM       Radiology Ct Abdomen Pelvis Wo Contrast  Result Date: 04/10/2016 CLINICAL DATA:  Diffuse abdominal pain, abdominal ecchymosis. History of renal transplant, diabetes. EXAM: CT ABDOMEN AND PELVIS WITHOUT CONTRAST TECHNIQUE: Multidetector CT imaging of the abdomen and pelvis was performed following the standard protocol without IV contrast. COMPARISON:  Renal ultrasound November 02, 2014 FINDINGS: LUNG BASES: Lung bases are clear. Heart size is normal, mild coronary artery and valvular calcifications. No pericardial effusion. KIDNEYS/BLADDER: Atrophic native kidneys without obstructive uropathy. RIGHT pelvic kidney transplant without hydronephrosis or urolithiasis. Limited assessment renal masses on noncontrast  CT. Urinary bladder is well distended and unremarkable. SOLID ORGANS: The liver, spleen, gallbladder, pancreas and adrenal glands  are unremarkable for this non-contrast examination. GASTROINTESTINAL TRACT: Very small hiatal hernia. The stomach, small and large bowel are normal in course and caliber without inflammatory changes, the sensitivity may be decreased by lack of enteric contrast. Moderate amount of retained large bowel stool. Normal appendix. PERITONEUM/RETROPERITONEUM: Aortoiliac vessels are normal in course and caliber, moderate calcific atherosclerosis. Severe small vessel atherosclerosis consistent with end-stage renal disease. No lymphadenopathy by CT size criteria. Internal reproductive organs are unremarkable. No intraperitoneal free fluid nor free air. SOFT TISSUES/ OSSEOUS STRUCTURES: Nonsuspicious. Anterior abdominal wall scarring, no focal fluid collection. Normal appearance of the abdominal wall. Moderate L4-5 and L5-S1 degenerative discs, moderate to severe L5-S1 neural foraminal narrowing. IMPRESSION: No acute intra-abdominal or pelvic process. Atrophic native kidneys. RIGHT pelvic renal transplant without urolithiasis or obstructive uropathy. Electronically Signed   By: Awilda Metro M.D.   On: 04/10/2016 04:51   Dg Chest 2 View  Result Date: 04/10/2016 CLINICAL DATA:  Renal transplant, abdominal ecchymosis. Altered mental status. History of hypertension, diabetes and end-stage renal disease. EXAM: CHEST  2 VIEW COMPARISON:  Chest radiograph March 27, 2016 FINDINGS: Cardiomediastinal silhouette is normal. The lungs are clear without pleural effusions or focal consolidations. Trachea projects midline and there is no pneumothorax. Soft tissue planes and included osseous structures are non-suspicious. Interval removal of LEFT internal jugular central venous catheter. IMPRESSION: No acute cardiopulmonary process. Electronically Signed   By: Awilda Metro M.D.   On: 04/10/2016 03:19    Ct Head Wo Contrast  Result Date: 04/10/2016 CLINICAL DATA:  50 year old female with altered mental status EXAM: CT HEAD WITHOUT CONTRAST TECHNIQUE: Contiguous axial images were obtained from the base of the skull through the vertex without intravenous contrast. COMPARISON:  Head CT dated 10/02/2009 FINDINGS: The ventricles and sulci are appropriate in size for the patient's age. There is no intracranial hemorrhage. No mass effect or midline shift identified. The gray-white matter differentiation is preserved. There is no extra-axial fluid collection. There is extensive mucoperiosteal thickening of paranasal sinuses with complete opacification of the maxillary sinuses, left sphenoid and frontal sinuses, and near complete opacification of the ethmoid air cells. There is a remodeling of the wall of the maxillary sinuses compatible with chronic sinusitis. Correlation with clinical exam recommended. MRI without and with contrast may provide better evaluation and exclude underlying mass if clinically indicated. The mastoid air cells are clear. The calvarium is intact. IMPRESSION: No acute intracranial pathology. Severe, chronic appearing paranasal sinus disease with remodeling of the walls of the maxillary sinuses. Clinical correlation is recommended. Electronically Signed   By: Elgie Collard M.D.   On: 04/10/2016 04:49   Mr Brain Wo Contrast  Result Date: 04/10/2016 CLINICAL DATA:  Altered mental status EXAM: MRI HEAD WITHOUT CONTRAST TECHNIQUE: Multiplanar, multiecho pulse sequences of the brain and surrounding structures were obtained without intravenous contrast. COMPARISON:  Head CT from earlier today FINDINGS: Calvarium and upper cervical spine: No focal marrow signal abnormality. Orbits: Bilateral cataract resection. Sinuses and Mastoids: Advanced chronic sinusitis with complete opacification/obstruction of the bilateral maxillary, left sphenoid, left anterior and right posterior ethmoids, and left  frontal sinuses. Mucosal thickening seen on T2 weighted imaging with central hypo intense inspissated secretions. There could be superimposed fungal colonization. The medial left maxillary sinus wall is bulging, obstructing the middle meatus and deviating the middle turbinates. No evidence of invasive sinusitis in this patient on immunosuppression. Brain: No acute infarct, hemorrhage, hydrocephalus, extra-axial collection or mass lesion. No evidence of large vessel occlusion. Mild patchy white  matter hyperintensities attributed to chronic microvascular disease given patient's risk factors. No previous cortical infarct. Normal cerebral volume. IMPRESSION: 1. No acute finding. 2. Advanced chronic sinusitis with diffuse sinus obstruction as described. 3. Mild chronic microvascular disease. Electronically Signed   By: Marnee Spring M.D.   On: 04/10/2016 07:28    Procedures Procedures (including critical care time)  Medications Ordered in ED Medications - No data to display   Initial Impression / Assessment and Plan / ED Course  I have reviewed the triage vital signs and the nursing notes.  Pertinent labs & imaging results that were available during my care of the patient were reviewed by me and considered in my medical decision making (see chart for details).  Clinical Course   Patient presents by EMS with altered mental status. Unknown last seen normal. Reportedly concern for hypoglycemia but this was not documented. Patient is a diabetic status post kidney transplant in 2011. Her family apparently found her confused and diaphoretic, blood sugar was not checked but they gave her candy.  EMS sugar was 90.  She is confused, oriented 1, has bruising to her abdomen of unknown etiology. Denies falling.   Altered mental status is uncertain etiology. Blood sugar is normal on arrival. Lactate is normal. Afebrile. No CO2 retention on ABG. Troponin minimally elevated. Creatinine slightly increased from  baseline. Gentle hydration given.  CT head and abdomen negative for acute pathology.  Patient states she feels better. However she is still confused and unable to state where she is or why she is here. She denies any head, neck, back, chest or abdominal pain. No infectious symptoms. No fever or leukocytosis. No meningismus.  Suspect she may have had episodes of hypoglycemia at home prior to EMS arrival and this may be the cause of her resultant encephalopathy. Will obtain MRI brain.  Admission d/w Dr. Toniann Fail. Final Clinical Impressions(s) / ED Diagnoses   Final diagnoses:  Altered mental status, unspecified altered mental status type    I personally performed the services described in this documentation, which was scribed in my presence. The recorded information has been reviewed and is accurate.    New Prescriptions New Prescriptions   No medications on file     Glynn Octave, MD 04/10/16 1345

## 2016-04-10 NOTE — Progress Notes (Signed)
Nutrition Brief Note  Patient identified on the Malnutrition Screening Tool (MST) Report  Wt Readings from Last 15 Encounters:  03/27/16 230 lb (104.3 kg)  03/01/16 225 lb (102.1 kg)  11/05/15 220 lb 7.4 oz (100 kg)  09/26/15 223 lb (101.2 kg)  06/02/15 213 lb 3 oz (96.7 kg)  05/23/15 225 lb (102.1 kg)  04/20/15 216 lb (98 kg)  03/02/15 214 lb 11.7 oz (97.4 kg)  01/02/15 226 lb 13.7 oz (102.9 kg)  10/29/14 215 lb 3.2 oz (97.6 kg)  10/22/14 208 lb 8.9 oz (94.6 kg)  06/08/14 205 lb 11 oz (93.3 kg)  05/11/14 227 lb 11.2 oz (103.3 kg)  04/21/14 223 lb 12.8 oz (101.5 kg)  03/23/14 222 lb 3.6 oz (100.8 kg)   [name] is a 50 y.o. Female with a hx of ESRD, headache, HTN, immunosuppression, and type II DM brought in by EMS who presents to the Emergency Department complaining of AMS onset PTA.   Pt emphatically yelling "hurry up" at multiple staff members at time of visit. RD unable to obtained hx or perform nutrition-focused physical exam at this time.   Noted RD completed NFPE on 03/29/16 which revealed no fat or muscle depletion; suspect no changes in exam at this time.   Per wt hx, no evidence of wt loss.   Pt has hx of poor DM control; DM coordinator following. Last Hgb A1c: 12.3 on 03/2016. RD indicated knowledge deficit during previous hospitalization. Pt may benefit from reinforcement of DM education principles once she is feeling better.   Estimated body mass index is 34.97 kg/m as calculated from the following:   Height as of 03/27/16: 5\' 8"  (1.727 m).   Weight as of 03/27/16: 230 lb (104.3 kg). Patient meets criteria for obesity, class I based on current BMI.   Current diet order is NPO, patient is consuming approximately n/a% of meals at this time. Labs and medications reviewed.   No nutrition interventions warranted at this time. If nutrition issues arise, please consult RD.   [name] A. Mayford Knife, RD, LDN, CDE Pager: [phone] After hours Pager: [phone]

## 2016-04-10 NOTE — ED Notes (Signed)
Report given to Miranda RN

## 2016-04-10 NOTE — ED Notes (Signed)
Updated family on phone

## 2016-04-10 NOTE — Care Management Obs Status (Signed)
MEDICARE OBSERVATION STATUS NOTIFICATION   Patient Details  Name: [name] MRN: [id number] Date of Birth: [birth year]   Medicare Observation Status Notification Given:  Yes    Lawerance Sabal, RN 04/10/2016, 2:28 PM

## 2016-04-10 NOTE — ED Notes (Signed)
Attempted report 

## 2016-04-10 NOTE — H&P (Signed)
Patient Demographics:    [name], is a 50 y.o. female  MRN: [id number]   DOB - Mar 25, 1966  Admit Date - 04/10/2016  Outpatient Primary MD for the patient is No primary care provider on file.   Assessment & Plan:    Principal Problem:   Altered mental status Active Problems:   Essential hypertension   History of renal transplant   Diabetes (HCC)    1)Altered mental Status/metabolic encephalopathy- history of recurrent symptomatic hypoglycemic episodes, even though glucose was 90 per EMS as seen, and 89 here on BMP, patient had 1 L of regular Coke and a candy bar 1 hour prior to arrival of EMS at home. Neurological workup including CT head and MRI of the brain without acute intracranial findings, Patient has no leukocytosis and no fevers . Clinical exam and lab workup reveals no evidence of acute infection (chest x-ray, CT abd/pelvis and UA noted), urine drug screen is negative. Serum ammonia is pending. When EMS arrived at the home patient glucose was 90, it is conceivable with the patient glucose was probably much lower prior to drinking over a liter of Coca-Cola Coke and eating a candy bar about an hour prior.  Treat empirically with D5 half-normal at 75 mL/hr  2)Acute on chronic kidney disease- recent creatinine was 0.9 with GFR over 60, creatinine today is 1.6 with GFR less than 40, suspect secondary to dehydration, hydrate IV, recheck renal parameters. Patient is status post previous renal transplant in 2011. Check serum Prograf levels. Continue prednisone, CellCept and Prograf  3)HTN- stable, continue Coreg and amlodipine,  may use IV Hydralazine 10 mg  Every 4 hours Prn for systolic blood pressure over 160 mmhg  4)Hypothyroidism- continue the right thoracic 137 g daily, check TSH  5)DM- erratic blood  sugars, recent admission with blood sugars over 700, at home patient has recurrent episodes of symptomatic hypoglycemia.  Patient had symptomatic hypoglycemia at home prior to arrival , blood sugars upon arrival of EMS was 90 because patient already had a liter of regular coke and candy bar 1 hour before arrival of EMS .  Treat empirically with D5 half-normal at 75 mL/hr Allow some permissive Hyperglycemia rather than risk life-threatening hypoglycemia in a patient with unreliable oral intake. Use Novolog/Humalog Sliding scale insulin with Accu-Cheks/Fingersticks as ordered. Hold metformin and  hold 70/30 insulin for now. Hemoglobin A1c was 12.3 on 03/30/2016 . Consider endocrinology follow-up post discharge   With History of - Reviewed by me  Past Medical History:  Diagnosis Date  . Arthritis    "elbows, knees, legs, back" (09/24/2015)  . Daily headache   . Depression    "years ago"  . End stage renal disease (HCC)    right arm AV graft, post transplant  . Hypertension   . Hypothyroid   . 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 Kidney disease   . Pneumonia ~ 2007?  [name]  Type II diabetes mellitus (HCC)    Insulin dependant      Past Surgical History:  Procedure Laterality Date  . AMPUTATION Left 05/11/2014   Procedure: AMPUTATION LEFT GREAT TOE;  Surgeon: Kathryne Hitch, MD;  Location: [facility];  Service: Orthopedics;  Laterality: Left;  . AV FISTULA PLACEMENT Right    forearm  . BACK SURGERY    . CATARACT EXTRACTION W/ INTRAOCULAR LENS  IMPLANT, BILATERAL Bilateral   . CESAREAN SECTION  07/1999  . DG AV DIALYSIS GRAFT DECLOT OR    . INCISION AND DRAINAGE Right 06/02/2015   Procedure: INCISION AND DRAINAGE OF RIGHT 3rd RAY RESECTION;  Surgeon: Kathryne Hitch, MD;  Location: [facility];  Service: Orthopedics;  Laterality: Right;  . 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 LUMBAR DISC SURGERY  2001  . NEPHRECTOMY TRANSPLANTED ORGAN    . TOE AMPUTATION  Right    1,2 & 3rd toes.  . TUBAL LIGATION  07/1999      Chief Complaint  Patient presents with  . Altered Mental Status      HPI:    [name]  is a 50 y.o. female, With past medical history relevant for end-stage renal disease status post kidney transplant in 2011 (currently on antirejection drugs), hypertension , hypothyroidism uncontrolled diabetes with erratic blood glucose, recent admission with blood sugars over 700, however home patient has been having recurrent episodes of symptomatic hypoglycemia. She presents by EMS today after another episode of hypoglycemia. Patient is very confused and unable to give any relevant history, I called and spoke with patient's husband by phone, according to patient's husband patient was in the usual state of health on 04/09/2016 when she went to bed, around 1 AM on 04/10/2016 patient's son found her confused and very diaphoretic. Patient's husband gave over 1 L of regular Coca-Cola and a candy bar. Patient sugars were not checked, patient remained diaphoretic and confused about an hour after drinking regular Coca-Cola and eating the candy bar so patient's husband called EMS. When EMS arrived at the home patient glucose was 90, it is conceivable with the patient glucose was probably much lower prior to drinking a Coke and eating a candy bar about an hour prior. bruising/ecchymosis in lower abdominal areas noted, patient's husband denies any trauma   ED Course:- CT head, MRI brain without acute findings, UA and chest x-ray without evidence of significant infection. No fevers, no leukocytosis, patient remained confused. Urine drug screen was unremarkable    Review of systems:    In addition to the HPI above,   A full 12 point Review of Systems was done, except as stated above, all other Review of Systems were negative.    Social History:  Reviewed by me    Social History  Substance Use Topics  . Smoking status: Former Smoker    Packs/day:  1.00    Years: 11.00    Types: Cigarettes    Quit date: 09/21/1998  . Smokeless tobacco: Never Used  . Alcohol use No       Family History :  Reviewed by me    Family History  Problem Relation Age of Onset  . Emphysema Mother   . Throat cancer Mother   . COPD Mother   . Cancer Mother   . Emphysema Father   . COPD Father   . Stroke Father   . ADD / ADHD Son       Home Medications:   Prior to  Admission medications   Medication Sig Start Date End Date Taking? Authorizing Provider  acetaminophen (TYLENOL) 500 MG tablet Take 500-1,000 mg by mouth every 6 (six) hours as needed for headache.    Historical Provider, MD  amLODipine (NORVASC) 5 MG tablet Take 5 mg by mouth daily. 02/28/16   Historical Provider, MD  aspirin EC 81 MG tablet Take 81 mg by mouth daily.    Historical Provider, MD  carvedilol (COREG) 25 MG tablet Take 1 tablet (25 mg total) by mouth 2 (two) times daily. 03/30/16   Jeanella Craze, NP  cyclobenzaprine (FLEXERIL) 10 MG tablet Take 10 mg by mouth daily. 02/13/16   Historical Provider, MD  gabapentin (NEURONTIN) 300 MG capsule Take 300 mg by mouth 2 (two) times daily. 02/13/16   Historical Provider, MD  insulin NPH-regular Human (NOVOLIN 70/30) (70-30) 100 UNIT/ML injection Inject 20 Units into the skin 2 (two) times daily with a meal. 03/31/16   Jeanella Craze, NP  levothyroxine (SYNTHROID, LEVOTHROID) 137 MCG tablet Take 1 tablet (137 mcg total) by mouth daily. BRAND NAME ONLY 03/04/15   Alison Murray, MD  metFORMIN (GLUCOPHAGE-XR) 500 MG 24 hr tablet Take 500 mg by mouth daily. 01/16/16   Historical Provider, MD  mycophenolate (CELLCEPT) 250 MG capsule Take 500 mg by mouth 2 (two) times daily.     Historical Provider, MD  nystatin cream (MYCOSTATIN) Clean groin with soap and water.  Dry area then apply cream to site until healed. 03/30/16   Jeanella Craze, NP  predniSONE (DELTASONE) 5 MG tablet Take 5 mg by mouth daily. 05/29/15   Historical Provider, MD  ranitidine  (ZANTAC) 150 MG capsule Take 150 mg by mouth every evening.     Historical Provider, MD  tacrolimus (PROGRAF) 1 MG capsule Take 1 mg by mouth 2 (two) times daily.     Historical Provider, MD     Allergies:     Allergies  Allergen Reactions  . Ace Inhibitors Cough    Lisinopril  . Adhesive [Tape] Itching     Physical Exam:   Vitals  Blood pressure 177/82, pulse 106, temperature 98.8 F (37.1 C), temperature source Oral, resp. rate 19, last menstrual period 11/19/2011, SpO2 93 %.  Physical Examination: General appearance - alert, Obese, confused  Mental status - awake but disoriented  Eyes - sclera anicteric Neck - supple, no JVD elevation , Chest - clear  to auscultation bilaterally, symmetrical air movement,  Heart - S1 and S2 normal,  Abdomen - soft, nontender, nondistended, increased truncal adiposity, bruising/ecchymosis in lower abdominal areas Neurological - patient is globally confused without new focal deficits , DTR's normal and symmetric Extremities - status post trans-metatarsal amputation of the right foot, status post of dictation of the left big toe Skin - warm, dry, she was apparently very diaphoretic at home, but not here    Data Review:    CBC  Recent Labs Lab 04/10/16 0253 04/10/16 0307  WBC 9.7  --   HGB 13.0 13.6  HCT 41.8 40.0  PLT 232  --   MCV 90.1  --   MCH 28.0  --   MCHC 31.1  --   RDW 14.7  --   LYMPHSABS 0.9  --   MONOABS 0.9  --   EOSABS 0.0  --   BASOSABS 0.0  --    ------------------------------------------------------------------------------------------------------------------  Chemistries   Recent Labs Lab 04/10/16 0253 04/10/16 0307  NA 138 141  K 4.3 4.2  CL  106 104  CO2 25  --   GLUCOSE 89 85  BUN 22* 24*  CREATININE 1.60* 1.50*  CALCIUM 9.6  --   AST 17  --   ALT 20  --   ALKPHOS 103  --   BILITOT 0.4  --     ------------------------------------------------------------------------------------------------------------------ estimated creatinine clearance is 57.4 mL/min (by C-G formula based on SCr of 1.5 mg/dL). ------------------------------------------------------------------------------------------------------------------ No results for input(s): TSH, T4TOTAL, T3FREE, THYROIDAB in the last 72 hours.  Invalid input(s): FREET3   Coagulation profile  Recent Labs Lab 04/10/16 0253  INR 0.94   ------------------------------------------------------------------------------------------------------------------- No results for input(s): DDIMER in the last 72 hours. -------------------------------------------------------------------------------------------------------------------  Cardiac Enzymes  Recent Labs Lab 04/10/16 0253  TROPONINI 0.03*   ------------------------------------------------------------------------------------------------------------------ No results found for: BNP  Lab Results  Component Value Date   HGBA1C 12.3 (H) 03/30/2016    ---------------------------------------------------------------------------------------------------------------  Urinalysis    Component Value Date/Time   COLORURINE YELLOW 04/10/2016 0430   APPEARANCEUR CLOUDY (A) 04/10/2016 0430   LABSPEC 1.021 04/10/2016 0430   PHURINE 5.5 04/10/2016 0430   GLUCOSEU NEGATIVE 04/10/2016 0430   HGBUR TRACE (A) 04/10/2016 0430   BILIRUBINUR NEGATIVE 04/10/2016 0430   KETONESUR NEGATIVE 04/10/2016 0430   PROTEINUR >300 (A) 04/10/2016 0430   UROBILINOGEN 0.2 03/02/2015 1153   NITRITE NEGATIVE 04/10/2016 0430   LEUKOCYTESUR NEGATIVE 04/10/2016 0430    ----------------------------------------------------------------------------------------------------------------   Imaging Results:    Ct Abdomen Pelvis Wo Contrast  Result Date: 04/10/2016 CLINICAL DATA:  Diffuse abdominal pain, abdominal  ecchymosis. History of renal transplant, diabetes. EXAM: CT ABDOMEN AND PELVIS WITHOUT CONTRAST TECHNIQUE: Multidetector CT imaging of the abdomen and pelvis was performed following the standard protocol without IV contrast. COMPARISON:  Renal ultrasound November 02, 2014 FINDINGS: LUNG BASES: Lung bases are clear. Heart size is normal, mild coronary artery and valvular calcifications. No pericardial effusion. KIDNEYS/BLADDER: Atrophic native kidneys without obstructive uropathy. RIGHT pelvic kidney transplant without hydronephrosis or urolithiasis. Limited assessment renal masses on noncontrast CT. Urinary bladder is well distended and unremarkable. SOLID ORGANS: The liver, spleen, gallbladder, pancreas and adrenal glands are unremarkable for this non-contrast examination. GASTROINTESTINAL TRACT: Very small hiatal hernia. The stomach, small and large bowel are normal in course and caliber without inflammatory changes, the sensitivity may be decreased by lack of enteric contrast. Moderate amount of retained large bowel stool. Normal appendix. PERITONEUM/RETROPERITONEUM: Aortoiliac vessels are normal in course and caliber, moderate calcific atherosclerosis. Severe small vessel atherosclerosis consistent with end-stage renal disease. No lymphadenopathy by CT size criteria. Internal reproductive organs are unremarkable. No intraperitoneal free fluid nor free air. SOFT TISSUES/ OSSEOUS STRUCTURES: Nonsuspicious. Anterior abdominal wall scarring, no focal fluid collection. Normal appearance of the abdominal wall. Moderate L4-5 and L5-S1 degenerative discs, moderate to severe L5-S1 neural foraminal narrowing. IMPRESSION: No acute intra-abdominal or pelvic process. Atrophic native kidneys. RIGHT pelvic renal transplant without urolithiasis or obstructive uropathy. Electronically Signed   By: Awilda Metro M.D.   On: 04/10/2016 04:51   Dg Chest 2 View  Result Date: 04/10/2016 CLINICAL DATA:  Renal transplant,  abdominal ecchymosis. Altered mental status. History of hypertension, diabetes and end-stage renal disease. EXAM: CHEST  2 VIEW COMPARISON:  Chest radiograph March 27, 2016 FINDINGS: Cardiomediastinal silhouette is normal. The lungs are clear without pleural effusions or focal consolidations. Trachea projects midline and there is no pneumothorax. Soft tissue planes and included osseous structures are non-suspicious. Interval removal of LEFT internal jugular central venous catheter. IMPRESSION: No acute cardiopulmonary process. Electronically Signed  By: Awilda Metro M.D.   On: 04/10/2016 03:19   Ct Head Wo Contrast  Result Date: 04/10/2016 CLINICAL DATA:  50 year old female with altered mental status EXAM: CT HEAD WITHOUT CONTRAST TECHNIQUE: Contiguous axial images were obtained from the base of the skull through the vertex without intravenous contrast. COMPARISON:  Head CT dated 10/02/2009 FINDINGS: The ventricles and sulci are appropriate in size for the patient's age. There is no intracranial hemorrhage. No mass effect or midline shift identified. The gray-white matter differentiation is preserved. There is no extra-axial fluid collection. There is extensive mucoperiosteal thickening of paranasal sinuses with complete opacification of the maxillary sinuses, left sphenoid and frontal sinuses, and near complete opacification of the ethmoid air cells. There is a remodeling of the wall of the maxillary sinuses compatible with chronic sinusitis. Correlation with clinical exam recommended. MRI without and with contrast may provide better evaluation and exclude underlying mass if clinically indicated. The mastoid air cells are clear. The calvarium is intact. IMPRESSION: No acute intracranial pathology. Severe, chronic appearing paranasal sinus disease with remodeling of the walls of the maxillary sinuses. Clinical correlation is recommended. Electronically Signed   By: Elgie Collard M.D.   On: 04/10/2016  04:49   Mr Brain Wo Contrast  Result Date: 04/10/2016 CLINICAL DATA:  Altered mental status EXAM: MRI HEAD WITHOUT CONTRAST TECHNIQUE: Multiplanar, multiecho pulse sequences of the brain and surrounding structures were obtained without intravenous contrast. COMPARISON:  Head CT from earlier today FINDINGS: Calvarium and upper cervical spine: No focal marrow signal abnormality. Orbits: Bilateral cataract resection. Sinuses and Mastoids: Advanced chronic sinusitis with complete opacification/obstruction of the bilateral maxillary, left sphenoid, left anterior and right posterior ethmoids, and left frontal sinuses. Mucosal thickening seen on T2 weighted imaging with central hypo intense inspissated secretions. There could be superimposed fungal colonization. The medial left maxillary sinus wall is bulging, obstructing the middle meatus and deviating the middle turbinates. No evidence of invasive sinusitis in this patient on immunosuppression. Brain: No acute infarct, hemorrhage, hydrocephalus, extra-axial collection or mass lesion. No evidence of large vessel occlusion. Mild patchy white matter hyperintensities attributed to chronic microvascular disease given patient's risk factors. No previous cortical infarct. Normal cerebral volume. IMPRESSION: 1. No acute finding. 2. Advanced chronic sinusitis with diffuse sinus obstruction as described. 3. Mild chronic microvascular disease. Electronically Signed   By: Marnee Spring M.D.   On: 04/10/2016 07:28    Radiological Exams on Admission: Ct Abdomen Pelvis Wo Contrast  Result Date: 04/10/2016 CLINICAL DATA:  Diffuse abdominal pain, abdominal ecchymosis. History of renal transplant, diabetes. EXAM: CT ABDOMEN AND PELVIS WITHOUT CONTRAST TECHNIQUE: Multidetector CT imaging of the abdomen and pelvis was performed following the standard protocol without IV contrast. COMPARISON:  Renal ultrasound November 02, 2014 FINDINGS: LUNG BASES: Lung bases are clear. Heart size  is normal, mild coronary artery and valvular calcifications. No pericardial effusion. KIDNEYS/BLADDER: Atrophic native kidneys without obstructive uropathy. RIGHT pelvic kidney transplant without hydronephrosis or urolithiasis. Limited assessment renal masses on noncontrast CT. Urinary bladder is well distended and unremarkable. SOLID ORGANS: The liver, spleen, gallbladder, pancreas and adrenal glands are unremarkable for this non-contrast examination. GASTROINTESTINAL TRACT: Very small hiatal hernia. The stomach, small and large bowel are normal in course and caliber without inflammatory changes, the sensitivity may be decreased by lack of enteric contrast. Moderate amount of retained large bowel stool. Normal appendix. PERITONEUM/RETROPERITONEUM: Aortoiliac vessels are normal in course and caliber, moderate calcific atherosclerosis. Severe small vessel atherosclerosis consistent with end-stage renal disease. No  lymphadenopathy by CT size criteria. Internal reproductive organs are unremarkable. No intraperitoneal free fluid nor free air. SOFT TISSUES/ OSSEOUS STRUCTURES: Nonsuspicious. Anterior abdominal wall scarring, no focal fluid collection. Normal appearance of the abdominal wall. Moderate L4-5 and L5-S1 degenerative discs, moderate to severe L5-S1 neural foraminal narrowing. IMPRESSION: No acute intra-abdominal or pelvic process. Atrophic native kidneys. RIGHT pelvic renal transplant without urolithiasis or obstructive uropathy. Electronically Signed   By: Awilda Metro M.D.   On: 04/10/2016 04:51   Dg Chest 2 View  Result Date: 04/10/2016 CLINICAL DATA:  Renal transplant, abdominal ecchymosis. Altered mental status. History of hypertension, diabetes and end-stage renal disease. EXAM: CHEST  2 VIEW COMPARISON:  Chest radiograph March 27, 2016 FINDINGS: Cardiomediastinal silhouette is normal. The lungs are clear without pleural effusions or focal consolidations. Trachea projects midline and there is no  pneumothorax. Soft tissue planes and included osseous structures are non-suspicious. Interval removal of LEFT internal jugular central venous catheter. IMPRESSION: No acute cardiopulmonary process. Electronically Signed   By: Awilda Metro M.D.   On: 04/10/2016 03:19   Ct Head Wo Contrast  Result Date: 04/10/2016 CLINICAL DATA:  50 year old female with altered mental status EXAM: CT HEAD WITHOUT CONTRAST TECHNIQUE: Contiguous axial images were obtained from the base of the skull through the vertex without intravenous contrast. COMPARISON:  Head CT dated 10/02/2009 FINDINGS: The ventricles and sulci are appropriate in size for the patient's age. There is no intracranial hemorrhage. No mass effect or midline shift identified. The gray-white matter differentiation is preserved. There is no extra-axial fluid collection. There is extensive mucoperiosteal thickening of paranasal sinuses with complete opacification of the maxillary sinuses, left sphenoid and frontal sinuses, and near complete opacification of the ethmoid air cells. There is a remodeling of the wall of the maxillary sinuses compatible with chronic sinusitis. Correlation with clinical exam recommended. MRI without and with contrast may provide better evaluation and exclude underlying mass if clinically indicated. The mastoid air cells are clear. The calvarium is intact. IMPRESSION: No acute intracranial pathology. Severe, chronic appearing paranasal sinus disease with remodeling of the walls of the maxillary sinuses. Clinical correlation is recommended. Electronically Signed   By: Elgie Collard M.D.   On: 04/10/2016 04:49   Mr Brain Wo Contrast  Result Date: 04/10/2016 CLINICAL DATA:  Altered mental status EXAM: MRI HEAD WITHOUT CONTRAST TECHNIQUE: Multiplanar, multiecho pulse sequences of the brain and surrounding structures were obtained without intravenous contrast. COMPARISON:  Head CT from earlier today FINDINGS: Calvarium and upper  cervical spine: No focal marrow signal abnormality. Orbits: Bilateral cataract resection. Sinuses and Mastoids: Advanced chronic sinusitis with complete opacification/obstruction of the bilateral maxillary, left sphenoid, left anterior and right posterior ethmoids, and left frontal sinuses. Mucosal thickening seen on T2 weighted imaging with central hypo intense inspissated secretions. There could be superimposed fungal colonization. The medial left maxillary sinus wall is bulging, obstructing the middle meatus and deviating the middle turbinates. No evidence of invasive sinusitis in this patient on immunosuppression. Brain: No acute infarct, hemorrhage, hydrocephalus, extra-axial collection or mass lesion. No evidence of large vessel occlusion. Mild patchy white matter hyperintensities attributed to chronic microvascular disease given patient's risk factors. No previous cortical infarct. Normal cerebral volume. IMPRESSION: 1. No acute finding. 2. Advanced chronic sinusitis with diffuse sinus obstruction as described. 3. Mild chronic microvascular disease. Electronically Signed   By: Marnee Spring M.D.   On: 04/10/2016 07:28    DVT Prophylaxis scd  AM Labs Ordered, also please review Full Orders  Family Communication: Admission, patients condition and plan of care including tests being ordered have been discussed with the patient"s  husband by phone who indicate understanding and agree with the plan   Code Status - Full Code  Likely DC to  Home with family  Condition   stable  Louise Rawson M.D on 04/10/2016 at 10:18 AM   Between 7am to 7pm - Pager - [phone]  After 7pm go to [email or web address] - password [email or web address]  [facility] - Office  [phone]  Dragon dictation system was used to create this note, attempts have been made to correct errors, however presence of uncorrected errors is not a reflection quality of care provided.

## 2016-04-10 NOTE — Progress Notes (Signed)
SLP Cancellation Note  Patient Details Name: [name] MRN: [id number] DOB: 05-22-1966   Cancelled treatment:       Reason Eval/Treat Not Completed: Medical issues which prohibited therapy; pt actively vomiting when BSE was attempted; will re-attempt tomorrow   ADAMS,PAT, M.S., CCC-SLP 04/10/2016, 1:36 PM

## 2016-04-11 ENCOUNTER — Observation Stay (HOSPITAL_COMMUNITY): Payer: Medicare Other

## 2016-04-11 DIAGNOSIS — I10 Essential (primary) hypertension: Secondary | ICD-10-CM | POA: Diagnosis not present

## 2016-04-11 DIAGNOSIS — G9340 Encephalopathy, unspecified: Secondary | ICD-10-CM | POA: Diagnosis not present

## 2016-04-11 DIAGNOSIS — Y830 Surgical operation with transplant of whole organ as the cause of abnormal reaction of the patient, or of later complication, without mention of misadventure at the time of the procedure: Secondary | ICD-10-CM | POA: Diagnosis present

## 2016-04-11 DIAGNOSIS — Z79899 Other long term (current) drug therapy: Secondary | ICD-10-CM | POA: Diagnosis not present

## 2016-04-11 DIAGNOSIS — N390 Urinary tract infection, site not specified: Secondary | ICD-10-CM | POA: Diagnosis present

## 2016-04-11 DIAGNOSIS — E039 Hypothyroidism, unspecified: Secondary | ICD-10-CM | POA: Diagnosis present

## 2016-04-11 DIAGNOSIS — Z87891 Personal history of nicotine dependence: Secondary | ICD-10-CM | POA: Diagnosis not present

## 2016-04-11 DIAGNOSIS — D899 Disorder involving the immune mechanism, unspecified: Secondary | ICD-10-CM | POA: Diagnosis not present

## 2016-04-11 DIAGNOSIS — Z89412 Acquired absence of left great toe: Secondary | ICD-10-CM | POA: Diagnosis not present

## 2016-04-11 DIAGNOSIS — E1022 Type 1 diabetes mellitus with diabetic chronic kidney disease: Secondary | ICD-10-CM | POA: Diagnosis present

## 2016-04-11 DIAGNOSIS — Z888 Allergy status to other drugs, medicaments and biological substances status: Secondary | ICD-10-CM | POA: Diagnosis not present

## 2016-04-11 DIAGNOSIS — Z940 Kidney transplant status: Secondary | ICD-10-CM | POA: Diagnosis not present

## 2016-04-11 DIAGNOSIS — R4182 Altered mental status, unspecified: Secondary | ICD-10-CM | POA: Diagnosis present

## 2016-04-11 DIAGNOSIS — Z7984 Long term (current) use of oral hypoglycemic drugs: Secondary | ICD-10-CM | POA: Diagnosis not present

## 2016-04-11 DIAGNOSIS — N183 Chronic kidney disease, stage 3 (moderate): Secondary | ICD-10-CM | POA: Diagnosis present

## 2016-04-11 DIAGNOSIS — I509 Heart failure, unspecified: Secondary | ICD-10-CM | POA: Diagnosis not present

## 2016-04-11 DIAGNOSIS — F329 Major depressive disorder, single episode, unspecified: Secondary | ICD-10-CM | POA: Diagnosis present

## 2016-04-11 DIAGNOSIS — N179 Acute kidney failure, unspecified: Secondary | ICD-10-CM | POA: Diagnosis present

## 2016-04-11 DIAGNOSIS — E1010 Type 1 diabetes mellitus with ketoacidosis without coma: Secondary | ICD-10-CM | POA: Diagnosis present

## 2016-04-11 DIAGNOSIS — I129 Hypertensive chronic kidney disease with stage 1 through stage 4 chronic kidney disease, or unspecified chronic kidney disease: Secondary | ICD-10-CM | POA: Diagnosis present

## 2016-04-11 DIAGNOSIS — E860 Dehydration: Secondary | ICD-10-CM | POA: Diagnosis present

## 2016-04-11 DIAGNOSIS — I4510 Unspecified right bundle-branch block: Secondary | ICD-10-CM | POA: Diagnosis present

## 2016-04-11 DIAGNOSIS — Z794 Long term (current) use of insulin: Secondary | ICD-10-CM | POA: Diagnosis not present

## 2016-04-11 DIAGNOSIS — A419 Sepsis, unspecified organism: Secondary | ICD-10-CM | POA: Diagnosis present

## 2016-04-11 DIAGNOSIS — T8612 Kidney transplant failure: Secondary | ICD-10-CM | POA: Diagnosis present

## 2016-04-11 DIAGNOSIS — M179 Osteoarthritis of knee, unspecified: Secondary | ICD-10-CM | POA: Diagnosis present

## 2016-04-11 DIAGNOSIS — M19029 Primary osteoarthritis, unspecified elbow: Secondary | ICD-10-CM | POA: Diagnosis present

## 2016-04-11 DIAGNOSIS — Z89421 Acquired absence of other right toe(s): Secondary | ICD-10-CM | POA: Diagnosis not present

## 2016-04-11 DIAGNOSIS — Z7982 Long term (current) use of aspirin: Secondary | ICD-10-CM | POA: Diagnosis not present

## 2016-04-11 DIAGNOSIS — L8990 Pressure ulcer of unspecified site, unspecified stage: Secondary | ICD-10-CM | POA: Insufficient documentation

## 2016-04-11 DIAGNOSIS — Z7952 Long term (current) use of systemic steroids: Secondary | ICD-10-CM | POA: Diagnosis not present

## 2016-04-11 LAB — GLUCOSE, CAPILLARY
GLUCOSE-CAPILLARY: 442 mg/dL — AB (ref 65–99)
GLUCOSE-CAPILLARY: 431 mg/dL — AB (ref 65–99)
GLUCOSE-CAPILLARY: 304 mg/dL — AB (ref 65–99)
GLUCOSE-CAPILLARY: 213 mg/dL — AB (ref 65–99)
GLUCOSE-CAPILLARY: 214 mg/dL — AB (ref 65–99)
Glucose-Capillary: 407 mg/dL — ABNORMAL HIGH (ref 65–99)
Glucose-Capillary: 197 mg/dL — ABNORMAL HIGH (ref 65–99)
Glucose-Capillary: 191 mg/dL — ABNORMAL HIGH (ref 65–99)

## 2016-04-11 LAB — BASIC METABOLIC PANEL
ANION GAP: 6 (ref 5–15)
Anion gap: 18 — ABNORMAL HIGH (ref 5–15)
Anion gap: 9 (ref 5–15)
BUN: 19 mg/dL (ref 6–20)
BUN: 18 mg/dL (ref 6–20)
BUN: 18 mg/dL (ref 6–20)
CALCIUM: 9.2 mg/dL (ref 8.9–10.3)
CALCIUM: 9.4 mg/dL (ref 8.9–10.3)
CALCIUM: 9.2 mg/dL (ref 8.9–10.3)
CHLORIDE: 112 mmol/L — AB (ref 101–111)
CO2: 10 mmol/L — AB (ref 22–32)
CO2: 18 mmol/L — AB (ref 22–32)
CO2: 20 mmol/L — AB (ref 22–32)
CREATININE: 1.63 mg/dL — AB (ref 0.44–1.00)
CREATININE: 1.53 mg/dL — AB (ref 0.44–1.00)
CREATININE: 1.46 mg/dL — AB (ref 0.44–1.00)
Chloride: 106 mmol/L (ref 101–111)
Chloride: 109 mmol/L (ref 101–111)
GFR calc Af Amer: 42 mL/min — ABNORMAL LOW (ref >60)
GFR calc Af Amer: 45 mL/min — ABNORMAL LOW (ref >60)
GFR calc non Af Amer: 36 mL/min — ABNORMAL LOW (ref >60)
GFR calc non Af Amer: 39 mL/min — ABNORMAL LOW (ref >60)
GFR calc non Af Amer: 41 mL/min — ABNORMAL LOW (ref >60)
GFR, EST AFRICAN AMERICAN: 48 mL/min — AB (ref >60)
GLUCOSE: 424 mg/dL — AB (ref 65–99)
GLUCOSE: 262 mg/dL — AB (ref 65–99)
Glucose, Bld: 192 mg/dL — ABNORMAL HIGH (ref 65–99)
Potassium: 5.3 mmol/L — ABNORMAL HIGH (ref 3.5–5.1)
Potassium: 3.9 mmol/L (ref 3.5–5.1)
Potassium: 3.8 mmol/L (ref 3.5–5.1)
SODIUM: 138 mmol/L (ref 135–145)
Sodium: 134 mmol/L — ABNORMAL LOW (ref 135–145)
Sodium: 136 mmol/L (ref 135–145)

## 2016-04-11 LAB — COMPREHENSIVE METABOLIC PANEL
ALK PHOS: 96 U/L (ref 38–126)
ALT: 15 U/L (ref 14–54)
AST: 13 U/L — ABNORMAL LOW (ref 15–41)
Albumin: 2.5 g/dL — ABNORMAL LOW (ref 3.5–5.0)
Anion gap: 17 — ABNORMAL HIGH (ref 5–15)
BILIRUBIN TOTAL: 2 mg/dL — AB (ref 0.3–1.2)
BUN: 18 mg/dL (ref 6–20)
CALCIUM: 9.2 mg/dL (ref 8.9–10.3)
CO2: 12 mmol/L — ABNORMAL LOW (ref 22–32)
Chloride: 103 mmol/L (ref 101–111)
Creatinine, Ser: 1.69 mg/dL — ABNORMAL HIGH (ref 0.44–1.00)
GFR, EST AFRICAN AMERICAN: 40 mL/min — AB (ref >60)
GFR, EST NON AFRICAN AMERICAN: 34 mL/min — AB (ref >60)
Glucose, Bld: 474 mg/dL — ABNORMAL HIGH (ref 65–99)
Potassium: 4.7 mmol/L (ref 3.5–5.1)
Sodium: 132 mmol/L — ABNORMAL LOW (ref 135–145)
TOTAL PROTEIN: 5.9 g/dL — AB (ref 6.5–8.1)

## 2016-04-11 LAB — CBC
HCT: 37.7 % (ref 36.0–46.0)
HEMOGLOBIN: 11.9 g/dL — AB (ref 12.0–15.0)
MCH: 29 pg (ref 26.0–34.0)
MCHC: 31.6 g/dL (ref 30.0–36.0)
MCV: 91.7 fL (ref 78.0–100.0)
PLATELETS: 177 10*3/uL (ref 150–400)
RBC: 4.11 MIL/uL (ref 3.87–5.11)
RDW: 14.8 % (ref 11.5–15.5)
WBC: 8.3 10*3/uL (ref 4.0–10.5)

## 2016-04-11 LAB — APTT: aPTT: 32 seconds (ref 24–36)

## 2016-04-11 LAB — BLOOD GAS, ARTERIAL
ACID-BASE DEFICIT: 14.1 mmol/L — AB (ref 0.0–2.0)
BICARBONATE: 10 meq/L — AB (ref 20.0–24.0)
Drawn by: 23588
FIO2: 0.21
O2 Saturation: 95.8 %
PCO2 ART: 16.2 mmHg — AB (ref 35.0–45.0)
PO2 ART: 117 mmHg — AB (ref 80.0–100.0)
Patient temperature: 98.6
TCO2: 10.5 mmol/L (ref 0–100)
pH, Arterial: 7.407 (ref 7.350–7.450)

## 2016-04-11 LAB — PROCALCITONIN

## 2016-04-11 LAB — LACTIC ACID, PLASMA: LACTIC ACID, VENOUS: 1.5 mmol/L (ref 0.5–1.9)

## 2016-04-11 LAB — PROTIME-INR
INR: 1.16
Prothrombin Time: 14.9 seconds (ref 11.4–15.2)

## 2016-04-11 MED ORDER — DEXTROSE 5 % IV SOLN
10.0000 mg/kg | Freq: Three times a day (TID) | INTRAVENOUS | Status: DC
  Administered 2016-04-11 – 2016-04-13 (×6): 800 mg via INTRAVENOUS
  Filled 2016-04-11 (×8): qty 16

## 2016-04-11 MED ORDER — VANCOMYCIN HCL IN DEXTROSE 1-5 GM/200ML-% IV SOLN: 1000.0000 mg | Freq: Once | INTRAVENOUS | Status: DC

## 2016-04-11 MED ORDER — PIPERACILLIN-TAZOBACTAM 3.375 G IVPB
3.3750 g | Freq: Three times a day (TID) | INTRAVENOUS | Status: DC
  Filled 2016-04-11 (×3): qty 50

## 2016-04-11 MED ORDER — POTASSIUM CHLORIDE 10 MEQ/100ML IV SOLN
10.0000 meq | INTRAVENOUS | Status: AC
Start: 2016-04-11 — End: 2016-04-11
  Filled 2016-04-11: qty 100

## 2016-04-11 MED ORDER — DEXTROSE 5 % IV SOLN
2.0000 g | Freq: Three times a day (TID) | INTRAVENOUS | Status: DC
  Administered 2016-04-11 – 2016-04-14 (×8): 2 g via INTRAVENOUS
  Filled 2016-04-11 (×10): qty 2

## 2016-04-11 MED ORDER — CEFEPIME HCL 2 G IJ SOLR
2.0000 g | Freq: Three times a day (TID) | INTRAMUSCULAR | Status: DC
Start: 2016-04-11 — End: 2016-04-11
  Administered 2016-04-11: 2 g via INTRAVENOUS
  Filled 2016-04-11 (×3): qty 2

## 2016-04-11 MED ORDER — SODIUM CHLORIDE 0.9 % IV BOLUS (SEPSIS)
1000.0000 mL | Freq: Once | INTRAVENOUS | Status: AC
  Administered 2016-04-11: 1000 mL via INTRAVENOUS

## 2016-04-11 MED ORDER — SODIUM CHLORIDE 0.9 % IV BOLUS (SEPSIS): 500.0000 mL | Freq: Once | INTRAVENOUS | Status: DC

## 2016-04-11 MED ORDER — DEXTROSE-NACL 5-0.45 % IV SOLN: INTRAVENOUS | Status: DC

## 2016-04-11 MED ORDER — SODIUM CHLORIDE 0.9 % IV SOLN
INTRAVENOUS | Status: DC
  Administered 2016-04-13: 5.3 [IU]/h via INTRAVENOUS
  Administered 2016-04-13: 2.6 [IU]/h via INTRAVENOUS
  Administered 2016-04-14: 1.7 [IU]/h via INTRAVENOUS
  Administered 2016-04-14: 4 [IU]/h via INTRAVENOUS
  Administered 2016-04-14: 0.8 [IU]/h via INTRAVENOUS
  Administered 2016-04-14: 4.3 [IU]/h via INTRAVENOUS
  Filled 2016-04-11: qty 2.5

## 2016-04-11 MED ORDER — SODIUM CHLORIDE 0.9 % IV BOLUS (SEPSIS)
500.0000 mL | Freq: Once | INTRAVENOUS | Status: AC
  Administered 2016-04-11: 500 mL via INTRAVENOUS

## 2016-04-11 MED ORDER — DEXTROSE 5 % IV SOLN
10.0000 mg/kg | Freq: Three times a day (TID) | INTRAVENOUS | Status: DC
  Administered 2016-04-11: 800 mg via INTRAVENOUS
  Filled 2016-04-11 (×3): qty 16

## 2016-04-11 MED ORDER — DEXTROSE-NACL 5-0.45 % IV SOLN
INTRAVENOUS | Status: DC
  Administered 2016-04-11 – 2016-04-13 (×4): via INTRAVENOUS
  Administered 2016-04-14: 1000 mL via INTRAVENOUS

## 2016-04-11 MED ORDER — VANCOMYCIN HCL 10 G IV SOLR
1500.0000 mg | Freq: Two times a day (BID) | INTRAVENOUS | Status: DC
  Filled 2016-04-11: qty 1500

## 2016-04-11 MED ORDER — SODIUM CHLORIDE 0.9 % IV SOLN
1.0000 g | Freq: Four times a day (QID) | INTRAVENOUS | Status: DC
  Administered 2016-04-11 – 2016-04-13 (×9): 1 g via INTRAVENOUS
  Filled 2016-04-11 (×13): qty 1000

## 2016-04-11 MED ORDER — VANCOMYCIN HCL 10 G IV SOLR
1500.0000 mg | Freq: Two times a day (BID) | INTRAVENOUS | Status: DC
  Administered 2016-04-12 (×2): 1500 mg via INTRAVENOUS
  Filled 2016-04-11 (×4): qty 1500

## 2016-04-11 MED ORDER — SODIUM CHLORIDE 0.9 % IV SOLN: INTRAVENOUS | Status: AC

## 2016-04-11 MED ORDER — VANCOMYCIN HCL IN DEXTROSE 1-5 GM/200ML-% IV SOLN
1000.0000 mg | Freq: Two times a day (BID) | INTRAVENOUS | Status: DC
  Filled 2016-04-11: qty 200

## 2016-04-11 MED ORDER — SODIUM CHLORIDE 0.9 % IV SOLN
INTRAVENOUS | Status: DC
  Administered 2016-04-11: 16:00:00 via INTRAVENOUS
  Administered 2016-04-14: 1000 mL via INTRAVENOUS

## 2016-04-11 MED ORDER — SODIUM CHLORIDE 0.9 % IV SOLN: INTRAVENOUS | Status: DC

## 2016-04-11 MED ORDER — SODIUM CHLORIDE 0.9 % IV SOLN
INTRAVENOUS | Status: DC
  Administered 2016-04-11: 3.4 [IU]/h via INTRAVENOUS
  Filled 2016-04-11: qty 2.5

## 2016-04-11 MED ORDER — VANCOMYCIN HCL 10 G IV SOLR
1500.0000 mg | Freq: Once | INTRAVENOUS | Status: AC
  Administered 2016-04-11: 1500 mg via INTRAVENOUS
  Filled 2016-04-11: qty 1500

## 2016-04-11 MED ORDER — PIPERACILLIN-TAZOBACTAM 3.375 G IVPB 30 MIN: 3.3750 g | Freq: Once | INTRAVENOUS | Status: DC

## 2016-04-11 MED ORDER — SODIUM CHLORIDE 0.9 % IV SOLN
INTRAVENOUS | Status: DC
  Administered 2016-04-14 (×2): via INTRAVENOUS

## 2016-04-11 NOTE — Progress Notes (Signed)
EEG Completed; Results Pending  

## 2016-04-11 NOTE — Progress Notes (Signed)
Received report on patient.  On assessment, patient is lying quietly.  Unable to do neuro check on this patient.  Does not respond to questions.   IV fluids infusing, bed alarm is on. Will continue to monitor.

## 2016-04-11 NOTE — Progress Notes (Signed)
PROGRESS NOTE    [name]  IRS:[id number] DOB: 07-Nov-1965 DOA: 04/10/2016 PCP: No primary care provider on file.  Brief Narrative: [name]  is a 50 y.o. female, With past medical history relevant for end-stage renal disease status post kidney transplant in 2011 (currently on antirejection drugs), hypertension , hypothyroidism uncontrolled diabetes with erratic blood glucose, recent admission with blood sugars over 700, however home patient has been having recurrent episodes of symptomatic hypoglycemia. She presents by EMS today after another episode of hypoglycemia. Patient is very confused and unable to give any relevant history, I called and spoke with patient's husband by phone, according to patient's husband patient was in the usual state of health on 04/09/2016 when she went to bed, around 1 AM on 04/10/2016 patient's son found her confused and very diaphoretic. Patient's husband gave over 1 L of regular Coca-Cola and a candy bar. Patient sugars were not checked, patient remained diaphoretic and confused about an hour after drinking regular Coca-Cola and eating the candy bar so patient's husband called EMS. When EMS arrived at the home patient glucose was 90, it is conceivable with the patient glucose was probably much lower prior to drinking a Coke and eating a candy bar about an hour prior. bruising/ecchymosis in lower abdominal areas noted, patient's husband denies any trauma   ED Course:- CT head, MRI brain without acute findings, UA and chest x-ray without evidence of significant infection. No fevers, no leukocytosis, patient remained confused. Urine drug screen was unremarkable   Assessment & Plan:   Principal Problem:   Altered mental status Active Problems:   Essential hypertension   History of renal transplant   Diabetes (HCC)   Pressure ulcer  1-Acute encephalopathy; fevers,  Concern for meningitis/encephalitis, patient with AMS, fever, no focal evidence of  infection, on immune suppressive medications.  Start Vancomycin, ampicillin , cefepime. Acyclovir.  Neurology consulted , defer to neuro LP.  Transfer to step down for close monitoring.  Check EEG.  MRI negative for stroke.   2-DM; initially with concern of hypoglycemia. CBG increasing. Continue with SSI.   3-AKI, History of renal transplant on immunosuppressive.  Panda tube placement, patient needs to get her medications; prednisone, Cellcept, Prograf.  IV fluids, appears dehydrated.   4-Possible sepsis; fever, tachycardia, tachypnea.  IV fluids. Trend lactic acid.  IV antibiotics.  Follow urine culture, blood culture. CT abdomen negative for infection.   5-Hypothyroidism; continue with synthroid.   DVT prophylaxis:  Code Status: full code.  Family Communication: husband at bedside.  Disposition Plan: Remain in the hospital. Transfer to step down.    Consultants:   Neurology   Procedures:   EEG  Antimicrobials:   Vancomycin 8-05  Cefepime 8-05  Ampicillin 8-05  Acyclovir 8/05   Subjective: Husband at bedside relates that she wake up on Thursday confuse, diaphoretic. He thought her blood sugar was low and make her drink coke. She didn't respond, he called EMS.   Objective: Vitals:   04/10/16 1806 04/10/16 2113 04/11/16 0145 04/11/16 0508  BP: (!) 168/63 (!) 169/63  (!) 162/56  Pulse: (!) 110 (!) 108  99  Resp: 16 20  17   Temp: (!) 100.8 F (38.2 C) (!) 102.4 F (39.1 C) 99.9 F (37.7 C) 98.1 F (36.7 C)  TempSrc: Oral Oral Oral Oral  SpO2: 94% 94%  94%    Intake/Output Summary (Last 24 hours) at 04/11/16 0847 Last data filed at 04/11/16 0748  Gross per 24 hour  Intake  433.75 ml  Output             2650 ml  Net         -2216.25 ml   There were no vitals filed for this visit.  Examination:  General exam: Appears calm and comfortable  Respiratory system: Clear to auscultation. Respiratory effort normal. Cardiovascular system: S1 & S2  heard, RRR. No JVD, murmurs, rubs, gallops or clicks. No pedal edema. Gastrointestinal system: Abdomen is nondistended, soft and nontender. No organomegaly or masses felt. Normal bowel sounds heard. Central nervous system: lethargic, open eyes to voice, change position in bed. Confuse, fall back to sleep, right arm was rigid at some point during evaluation.  Extremities: Symmetric 5 x 5 power. Skin: No rashes, lesions or ulcers Psychiatry: unable to evaluate.     Data Reviewed: I have personally reviewed following labs and imaging studies  CBC:  Recent Labs Lab 04/10/16 0253 04/10/16 0307 04/10/16 1137 04/11/16 0503  WBC 9.7  --  9.4 8.3  NEUTROABS 8.0*  --   --   --   HGB 13.0 13.6 12.1 11.9*  HCT 41.8 40.0 39.4 37.7  MCV 90.1  --  89.5 91.7  PLT 232  --  216 177   Basic Metabolic Panel:  Recent Labs Lab 04/10/16 0253 04/10/16 0307 04/10/16 1137  NA 138 141  --   K 4.3 4.2  --   CL 106 104  --   CO2 25  --   --   GLUCOSE 89 85  --   BUN 22* 24*  --   CREATININE 1.60* 1.50* 1.30*  CALCIUM 9.6  --   --    GFR: CrCl cannot be calculated (Unknown ideal weight.). Liver Function Tests:  Recent Labs Lab 04/10/16 0253  AST 17  ALT 20  ALKPHOS 103  BILITOT 0.4  PROT 6.4*  ALBUMIN 2.9*   No results for input(s): LIPASE, AMYLASE in the last 168 hours.  Recent Labs Lab 04/10/16 1137  AMMONIA 28   Coagulation Profile:  Recent Labs Lab 04/10/16 0253  INR 0.94   Cardiac Enzymes:  Recent Labs Lab 04/10/16 0253  TROPONINI 0.03*   BNP (last 3 results) No results for input(s): PROBNP in the last 8760 hours. HbA1C: No results for input(s): HGBA1C in the last 72 hours. CBG:  Recent Labs Lab 04/10/16 1045 04/10/16 1248 04/10/16 1658 04/10/16 2018 04/11/16 0804  GLUCAP 162* 191* 301* 281* 442*   Lipid Profile: No results for input(s): CHOL, HDL, LDLCALC, TRIG, CHOLHDL, LDLDIRECT in the last 72 hours. Thyroid Function Tests:  Recent Labs   04/10/16 1137  TSH 1.824   Anemia Panel: No results for input(s): VITAMINB12, FOLATE, FERRITIN, TIBC, IRON, RETICCTPCT in the last 72 hours. Sepsis Labs:  Recent Labs Lab 04/10/16 0308  LATICACIDVEN 1.69    Recent Results (from the past 240 hour(s))  Blood culture (routine x 2)     Status: None (Preliminary result)   Collection Time: 04/10/16  6:00 AM  Result Value Ref Range Status   Specimen Description BLOOD LEFT ARM  Final   Special Requests BOTTLES DRAWN AEROBIC AND ANAEROBIC 10CC  Final   Culture NO GROWTH < 12 HOURS  Final   Report Status PENDING  Incomplete  Blood culture (routine x 2)     Status: None (Preliminary result)   Collection Time: 04/10/16  6:05 AM  Result Value Ref Range Status   Specimen Description BLOOD LEFT HAND  Final   Special Requests IN  PEDIATRIC BOTTLE 4CC  Final   Culture NO GROWTH < 12 HOURS  Final   Report Status PENDING  Incomplete         Radiology Studies: Ct Abdomen Pelvis Wo Contrast  Result Date: 04/10/2016 CLINICAL DATA:  Diffuse abdominal pain, abdominal ecchymosis. History of renal transplant, diabetes. EXAM: CT ABDOMEN AND PELVIS WITHOUT CONTRAST TECHNIQUE: Multidetector CT imaging of the abdomen and pelvis was performed following the standard protocol without IV contrast. COMPARISON:  Renal ultrasound November 02, 2014 FINDINGS: LUNG BASES: Lung bases are clear. Heart size is normal, mild coronary artery and valvular calcifications. No pericardial effusion. KIDNEYS/BLADDER: Atrophic native kidneys without obstructive uropathy. RIGHT pelvic kidney transplant without hydronephrosis or urolithiasis. Limited assessment renal masses on noncontrast CT. Urinary bladder is well distended and unremarkable. SOLID ORGANS: The liver, spleen, gallbladder, pancreas and adrenal glands are unremarkable for this non-contrast examination. GASTROINTESTINAL TRACT: Very small hiatal hernia. The stomach, small and large bowel are normal in course and caliber  without inflammatory changes, the sensitivity may be decreased by lack of enteric contrast. Moderate amount of retained large bowel stool. Normal appendix. PERITONEUM/RETROPERITONEUM: Aortoiliac vessels are normal in course and caliber, moderate calcific atherosclerosis. Severe small vessel atherosclerosis consistent with end-stage renal disease. No lymphadenopathy by CT size criteria. Internal reproductive organs are unremarkable. No intraperitoneal free fluid nor free air. SOFT TISSUES/ OSSEOUS STRUCTURES: Nonsuspicious. Anterior abdominal wall scarring, no focal fluid collection. Normal appearance of the abdominal wall. Moderate L4-5 and L5-S1 degenerative discs, moderate to severe L5-S1 neural foraminal narrowing. IMPRESSION: No acute intra-abdominal or pelvic process. Atrophic native kidneys. RIGHT pelvic renal transplant without urolithiasis or obstructive uropathy. Electronically Signed   By: Awilda Metro M.D.   On: 04/10/2016 04:51   Dg Chest 2 View  Result Date: 04/10/2016 CLINICAL DATA:  Renal transplant, abdominal ecchymosis. Altered mental status. History of hypertension, diabetes and end-stage renal disease. EXAM: CHEST  2 VIEW COMPARISON:  Chest radiograph March 27, 2016 FINDINGS: Cardiomediastinal silhouette is normal. The lungs are clear without pleural effusions or focal consolidations. Trachea projects midline and there is no pneumothorax. Soft tissue planes and included osseous structures are non-suspicious. Interval removal of LEFT internal jugular central venous catheter. IMPRESSION: No acute cardiopulmonary process. Electronically Signed   By: Awilda Metro M.D.   On: 04/10/2016 03:19   Ct Head Wo Contrast  Result Date: 04/10/2016 CLINICAL DATA:  50 year old female with altered mental status EXAM: CT HEAD WITHOUT CONTRAST TECHNIQUE: Contiguous axial images were obtained from the base of the skull through the vertex without intravenous contrast. COMPARISON:  Head CT dated  10/02/2009 FINDINGS: The ventricles and sulci are appropriate in size for the patient's age. There is no intracranial hemorrhage. No mass effect or midline shift identified. The gray-white matter differentiation is preserved. There is no extra-axial fluid collection. There is extensive mucoperiosteal thickening of paranasal sinuses with complete opacification of the maxillary sinuses, left sphenoid and frontal sinuses, and near complete opacification of the ethmoid air cells. There is a remodeling of the wall of the maxillary sinuses compatible with chronic sinusitis. Correlation with clinical exam recommended. MRI without and with contrast may provide better evaluation and exclude underlying mass if clinically indicated. The mastoid air cells are clear. The calvarium is intact. IMPRESSION: No acute intracranial pathology. Severe, chronic appearing paranasal sinus disease with remodeling of the walls of the maxillary sinuses. Clinical correlation is recommended. Electronically Signed   By: Elgie Collard M.D.   On: 04/10/2016 04:49   Mr Brain Ilda Basset  Contrast  Result Date: 04/10/2016 CLINICAL DATA:  Altered mental status EXAM: MRI HEAD WITHOUT CONTRAST TECHNIQUE: Multiplanar, multiecho pulse sequences of the brain and surrounding structures were obtained without intravenous contrast. COMPARISON:  Head CT from earlier today FINDINGS: Calvarium and upper cervical spine: No focal marrow signal abnormality. Orbits: Bilateral cataract resection. Sinuses and Mastoids: Advanced chronic sinusitis with complete opacification/obstruction of the bilateral maxillary, left sphenoid, left anterior and right posterior ethmoids, and left frontal sinuses. Mucosal thickening seen on T2 weighted imaging with central hypo intense inspissated secretions. There could be superimposed fungal colonization. The medial left maxillary sinus wall is bulging, obstructing the middle meatus and deviating the middle turbinates. No evidence of  invasive sinusitis in this patient on immunosuppression. Brain: No acute infarct, hemorrhage, hydrocephalus, extra-axial collection or mass lesion. No evidence of large vessel occlusion. Mild patchy white matter hyperintensities attributed to chronic microvascular disease given patient's risk factors. No previous cortical infarct. Normal cerebral volume. IMPRESSION: 1. No acute finding. 2. Advanced chronic sinusitis with diffuse sinus obstruction as described. 3. Mild chronic microvascular disease. Electronically Signed   By: Marnee Spring M.D.   On: 04/10/2016 07:28   Dg Chest Port 1 View  Result Date: 04/11/2016 CLINICAL DATA:  Sepsis EXAM: PORTABLE CHEST 1 VIEW COMPARISON:  04/10/2016 FINDINGS: Normal mediastinum and cardiac silhouette. Normal pulmonary vasculature. No evidence of effusion, infiltrate, or pneumothorax. No acute bony abnormality. IMPRESSION: No acute cardiopulmonary process. Electronically Signed   By: Genevive Bi M.D.   On: 04/11/2016 08:21        Scheduled Meds: . amLODipine  5 mg Oral Daily  . ampicillin (OMNIPEN) IV  1 g Intravenous Q6H  . aspirin EC  81 mg Oral Daily  . carvedilol  25 mg Oral BID WC  . famotidine  20 mg Oral Daily  . gabapentin  300 mg Oral BID  . insulin aspart  0-9 Units Subcutaneous TID WC  . levothyroxine  137 mcg Oral QAC breakfast  . mycophenolate  500 mg Oral BID  . nystatin cream   Topical BID  . predniSONE  5 mg Oral Q breakfast  . senna  1 tablet Oral BID  . sodium chloride  1,000 mL Intravenous Once  . sodium chloride  500 mL Intravenous Once  . sodium chloride flush  3 mL Intravenous Q12H  . sodium chloride flush  3 mL Intravenous Q12H  . tacrolimus  1 mg Oral BID  . vancomycin  1,500 mg Intravenous Once  . vancomycin  1,000 mg Intravenous Q12H   Continuous Infusions: . sodium chloride       LOS: 0 days    Time spent: 35 minutes.     Alba Cory, MD [facility] Pager [phone]  If 7PM-7AM,  please contact night-coverage [email or web address] Password [place] 04/11/2016, 8:47 AM

## 2016-04-11 NOTE — Progress Notes (Signed)
SLP Cancellation Note  Patient Details Name: [name] MRN: [id number] DOB: 1966-03-02   Cancelled treatment:       Reason Eval/Treat Not Completed: Medical issues which prohibited therapy; nursing deferred at this time; pt with continued confusion/vomiting   ADAMS,PAT, M.S., CCC-SLP 04/11/2016, 12:05 PM

## 2016-04-11 NOTE — Progress Notes (Signed)
Pharmacy Antibiotic Note  [name] is a 50 y.o. female admitted on 04/10/2016 with AMS, now with fevers and possible sepsis  Pharmacy was consulted for vanc/zosyn. She is being r/o for meningitis/encephalitis now. Abx are changed to vanc/cefepime/acyclovir.   Plan:  Change vanc to 1.5g IV q12 Dc zosyn Cefepime 2g IV q8 Acyclovir 800mg  IV q8 Trough as needed    Temp (24hrs), Avg:100.3 F (37.9 C), Min:98.1 F (36.7 C), Max:102.4 F (39.1 C)   Recent Labs Lab 04/10/16 0253 04/10/16 0307 04/10/16 0308 04/10/16 1137 04/11/16 0503  WBC 9.7  --   --  9.4 8.3  CREATININE 1.60* 1.50*  --  1.30*  --   LATICACIDVEN  --   --  1.69  --   --     CrCl cannot be calculated (Unknown ideal weight.).    Allergies  Allergen Reactions  . Ace Inhibitors Cough    Lisinopril  . Adhesive [Tape] Itching   8/5 vanc>> 8/5 cefepime>> 8/5 acyclovir>> 8/5 ampicillin>>  Ulyses Southward, PharmD Pager: [phone] 04/11/2016 8:55 AM

## 2016-04-11 NOTE — Consult Note (Signed)
Reason for Consult: Altered mental status Referring Physician: Dr. Carmell Austria  CC: The patient is unable to give any history. History will be obtained from the patient's husband  HPI: [name] is an 50 y.o. female with a history of end-stage renal disease, status post renal transplant on immunosuppressive therapy, hypertension, frequent headaches, and poorly controlled diabetes mellitus, admitted to [facility] 04/10/2016 with altered mental status.. She apparently went to bed on 04/09/2016 in her usual state of health. Her son discovered her at around 1 AM on 04/10/2016 diaphoretic and confused. Recently the patient has had some episodes of hypoglycemia. She was given a soft drink and a candy bar. When EMS arrived they noted a blood sugar of 90. Initially the patient had a normal white blood cell count and no fever however today's temperature was 100.3 axillary. On 8/4 temperature was 102.4 orally. She was also noted to be tachycardic. There is some concern that she might possibly have meningitis and pharmacy has been asked to dose her antibiotics which include vancomycin cefepime and acyclovir. The patient is unable to give any history at this time. Her husband will be arriving soon to provide further details. Urine culture did reveal greater than 100,000 colonies of gram-negative rods. The final results are currently pending. Blood cultures are pending. Imaging studies have been unrevealing except chronic sinusitis on MRI. WBCs 8 -9 range.  Past Medical History:  Diagnosis Date  . Arthritis    "elbows, knees, legs, back" (09/24/2015)  . Daily headache   . Depression    "years ago"  . End stage renal disease (HCC)    right arm AV graft, post transplant  . Hypertension   . Hypothyroid   . 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 Kidney disease   . Pneumonia ~ 2007?  [name] Type II diabetes mellitus (HCC)    Insulin dependant    Past Surgical History:  Procedure  Laterality Date  . AMPUTATION Left 05/11/2014   Procedure: AMPUTATION LEFT GREAT TOE;  Surgeon: Kathryne Hitch, MD;  Location: [facility];  Service: Orthopedics;  Laterality: Left;  . AV FISTULA PLACEMENT Right    forearm  . BACK SURGERY    . CATARACT EXTRACTION W/ INTRAOCULAR LENS  IMPLANT, BILATERAL Bilateral   . CESAREAN SECTION  07/1999  . DG AV DIALYSIS GRAFT DECLOT OR    . INCISION AND DRAINAGE Right 06/02/2015   Procedure: INCISION AND DRAINAGE OF RIGHT 3rd RAY RESECTION;  Surgeon: Kathryne Hitch, MD;  Location: [facility] OR;  Service: Orthopedics;  Laterality: Right;  . 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 LUMBAR DISC SURGERY  2001  . NEPHRECTOMY TRANSPLANTED ORGAN    . TOE AMPUTATION Right    1,2 & 3rd toes.  . TUBAL LIGATION  07/1999    Family History  Problem Relation Age of Onset  . Emphysema Mother   . Throat cancer Mother   . COPD Mother   . Cancer Mother   . Emphysema Father   . COPD Father   . Stroke Father   . ADD / ADHD Son     Social History:  reports that she quit smoking about 17 years ago. Her smoking use included Cigarettes. She has a 11.00 pack-year smoking history. She has never used smokeless tobacco. She reports that she does not drink alcohol or use drugs.  Allergies  Allergen Reactions  . Ace Inhibitors Cough    Lisinopril  . Adhesive [Tape] Itching  Medications:  Scheduled: . acyclovir  10 mg/kg (Adjusted) Intravenous Q8H  . ampicillin (OMNIPEN) IV  1 g Intravenous Q6H  . aspirin EC  81 mg Oral Daily  . carvedilol  25 mg Oral BID WC  . ceFEPime (MAXIPIME) IV  2 g Intravenous Q8H  . famotidine  20 mg Oral Daily  . levothyroxine  137 mcg Oral QAC breakfast  . mycophenolate  500 mg Oral BID  . nystatin cream   Topical BID  . potassium chloride  10 mEq Intravenous Q1H  . predniSONE  5 mg Oral Q breakfast  . senna  1 tablet Oral BID  . sodium chloride flush  3 mL Intravenous Q12H  . sodium chloride flush  3 mL Intravenous Q12H   . tacrolimus  1 mg Oral BID  . [START ON 04/12/2016] vancomycin  1,500 mg Intravenous Q12H    ROS: The patient is unable to give a review of systems at this time. Further information will be obtained from her husband.. Per husband - no recent travel. No recent exposure to others who are ill. Has had HAs almost daily, unsteady gait,  and glucose  Difficult to control.    Physical Examination: Blood pressure (!) 173/84, pulse (!) 103, temperature 99.3 F (37.4 C), temperature source Axillary, resp. rate (!) 29, height  (1.727 m), weight 104.3 kg (230 lb), last menstrual period 11/19/2011, SpO2 96 %.   General - 50 yo female moaning and appears uncomfortable. Responds to voice but unable to follow commands. Heart - Regular rate and rhythm - rapid.  Lungs - Clear to auscultation anteriorly. Abdomen - Soft - non tender Extremities - Right trans metatarsal amputation. Multiple toes amputated on the left. Skin - Warm and dry  Neurologic Examination Mental Status: The patient is awake but does not follow commands. She appears uncomfortable with frequent moaning but no meaningful communication. She is unable to follow commands.  Cranial Nerves: II: Discs unable to visualize; Visual fields could not be tested.  III,IV, VI: Slight right ptosis.ptosis  Extra-ocular motions grossly intact  V,VII: smile symmetric, unable to assess sensation. VIII: hearing grossly intact IX,X: gag reflex could not be tested. XI: bilateral shoulder shrug - pt does not follow commands XII: midline tongue extension - Pt would not extend tongue Motor: Pt does not follow commands. Moves extremities spontaneously L>R Tone and bulk:normal tone throughout; no atrophy noted Sensory: Pinprick and light touch could not be tested. Deep Tendon Reflexes: unable to elicit Plantars: Right: trans metatarsal amputation  Left:mute Cerebellar: Pt unable to follow commands Gait: not tested   Laboratory Studies:   Basic  Metabolic Panel:  Recent Labs Lab 04/10/16 0253 04/10/16 0307 04/10/16 1137 04/11/16 0911  NA 138 141  --  132*  K 4.3 4.2  --  4.7  CL 106 104  --  103  CO2 25  --   --  12*  GLUCOSE 89 85  --  474*  BUN 22* 24*  --  18  CREATININE 1.60* 1.50* 1.30* 1.69*  CALCIUM 9.6  --   --  9.2    Liver Function Tests:  Recent Labs Lab 04/10/16 0253 04/11/16 0911  AST 17 13*  ALT 20 15  ALKPHOS 103 96  BILITOT 0.4 2.0*  PROT 6.4* 5.9*  ALBUMIN 2.9* 2.5*   No results for input(s): LIPASE, AMYLASE in the last 168 hours.  Recent Labs Lab 04/10/16 1137  AMMONIA 28    CBC:  Recent Labs Lab 04/10/16 0253  04/10/16 0307 04/10/16 1137 04/11/16 0503  WBC 9.7  --  9.4 8.3  NEUTROABS 8.0*  --   --   --   HGB 13.0 13.6 12.1 11.9*  HCT 41.8 40.0 39.4 37.7  MCV 90.1  --  89.5 91.7  PLT 232  --  216 177    Cardiac Enzymes:  Recent Labs Lab 04/10/16 0253  TROPONINI 0.03*    BNP: Invalid input(s): POCBNP  CBG:  Recent Labs Lab 04/10/16 1248 04/10/16 1658 04/10/16 2018 04/11/16 0804 04/11/16 1450  GLUCAP 191* 301* 281* 442* 431*    Microbiology: Results for orders placed or performed during the hospital encounter of 04/10/16  Urine culture     Status: Abnormal (Preliminary result)   Collection Time: 04/10/16  4:30 AM  Result Value Ref Range Status   Specimen Description URINE, RANDOM  Final   Special Requests NONE  Final   Culture >=100,000 COLONIES/mL GRAM NEGATIVE RODS (A)  Final   Report Status PENDING  Incomplete  Blood culture (routine x 2)     Status: None (Preliminary result)   Collection Time: 04/10/16  6:00 AM  Result Value Ref Range Status   Specimen Description BLOOD LEFT ARM  Final   Special Requests BOTTLES DRAWN AEROBIC AND ANAEROBIC 10CC  Final   Culture NO GROWTH < 12 HOURS  Final   Report Status PENDING  Incomplete  Blood culture (routine x 2)     Status: None (Preliminary result)   Collection Time: 04/10/16  6:05 AM  Result Value Ref  Range Status   Specimen Description BLOOD LEFT HAND  Final   Special Requests IN PEDIATRIC BOTTLE 4CC  Final   Culture NO GROWTH < 12 HOURS  Final   Report Status PENDING  Incomplete    Coagulation Studies:  Recent Labs  04/10/16 0253 04/11/16 0911  LABPROT 12.6 14.9  INR 0.94 1.16    Urinalysis:  Recent Labs Lab 04/10/16 0430  COLORURINE YELLOW  LABSPEC 1.021  PHURINE 5.5  GLUCOSEU NEGATIVE  HGBUR TRACE*  BILIRUBINUR NEGATIVE  KETONESUR NEGATIVE  PROTEINUR >300*  NITRITE NEGATIVE  LEUKOCYTESUR NEGATIVE    Lipid Panel:     Component Value Date/Time   CHOL 167 06/08/2013 0400   TRIG 146 06/08/2013 0400   HDL 49 06/08/2013 0400   CHOLHDL 3.4 06/08/2013 0400   VLDL 29 06/08/2013 0400   LDLCALC 89 06/08/2013 0400    HgbA1C:  Lab Results  Component Value Date   HGBA1C 12.3 (H) 03/30/2016    Urine Drug Screen:     Component Value Date/Time   LABOPIA NONE DETECTED 04/10/2016 0430   COCAINSCRNUR NONE DETECTED 04/10/2016 0430   LABBENZ NONE DETECTED 04/10/2016 0430   AMPHETMU NONE DETECTED 04/10/2016 0430   THCU NONE DETECTED 04/10/2016 0430   LABBARB NONE DETECTED 04/10/2016 0430    Alcohol Level: No results for input(s): ETH in the last 168 hours.  Other results: EKG: sinus arrthymia RBBB rate 92 BPM  Imaging:  Ct Abdomen Pelvis Wo Contrast 04/10/2016 No acute intra-abdominal or pelvic process. Atrophic native kidneys. RIGHT pelvic renal transplant without urolithiasis or obstructive uropathy.  Dg Chest 2 View 04/10/2016 No acute cardiopulmonary process.   Ct Head Wo Contrast 04/10/2016 No acute intracranial pathology. Severe, chronic appearing paranasal sinus disease with remodeling of the walls of the maxillary sinuses. Clinical correlation is recommended.    Mr Brain Wo Contrast 04/10/2016 1. No acute finding.  2. Advanced chronic sinusitis with diffuse sinus obstruction as described.  3. Mild chronic microvascular disease.    Dg Chest Port 1  View 04/11/2016 No acute cardiopulmonary process.     Assessment/Plan:  50 y.o. female with a history of end-stage renal disease, status post renal transplant on immunosuppressive therapy, hypertension, frequent headaches, and poorly controlled diabetes mellitus, admitted to [facility] 04/10/2016 with altered mental status. Neurology has been asked to see the patient for possible meningitis. LP planned.  Further assessment and plan to follow per Dr Roxy Manns.   Delton See PA-C [facility] Pager [phone] 04/11/2016, 4:46 PM   Neurology Attending Addendum  This patient was seen, examined, and d/w PA. I have reviewed the note and agree with the findings, assessment and plan as documented with the following additions.   In brief, this is a 49-yo brittle diabetic with h/o renal transplant on chronic immunosuppression brought to ED by family after being found to have altered mental status at home. Husband reports she has had similar presentation related to blood sugar abnormalities in the past. However, this time she has been febrile with Tmax 102.13F on 04/10/16.   Exam: As per PA. She is markedly encephalopathic. She will occasionally open her eyes to voice but does not consistently fix or track. She follows no commands. She is nonverbal but will moan on occasion. She does not clearly blink to visual threat. Grimace is symmetric. She moves all extremities spontaneously but has less observed movement of the right side. She will withdraw all four extremities from nailbed pressure, however.   Diagnostic studies:  I have personally and independently reviewed the MRI brain without contrast from 04/10/16. This shows no acute ischemia or other acute pathology. A moderate degree of chronic small vessel ischemic disease is noted in the bihemispheric white matter. There is moderate diffuse atrophy.   EEG showed relative suppression with marked slowing in the left cerebral hemisphere  but no seizure.   Labs reviewed.   Impression: 1. Encephalopathy: this is acute. Etiology remains unclear. The presence of altered mental status and fever in a post-transplant patient is concerning for CNS infection. In the late transplant period, common infectious agents include Listeria, endogenous fungi (blastomyces, histoplasma, coccidioides), cryptococcus, and general community acquired bacteria (strep pneumo, N meningitidis), and HSV. She is not able to tolerate LP without heavy sedation at this time due to her level of agitation so this will be deferred. Continue empiric antimicrobial coverage with acyclovir, ampicillin, vancomycin, and cefepime. She had a UTI but her level of confusion seems out of proportion to this. Her glucoses have not been significantly low in hospital or with EMS, though cannot exclude initial hypoglycemia at home as patient's family gave her a candy bar and Coke just before EMS measured it. She has, however, had readings in the 400s during this admission which could be contributing to her encephalopathy. Continue to optimize metabolic status. Limit sedating medications as much as possible, especially benzos, opiates, and anything with strong anticholinergic properties.

## 2016-04-11 NOTE — Procedures (Signed)
Electroencephalogram (EEG) Report  Date of study: 04/11/16  Requesting clinician: Hartley Barefoot  Reason for study: evaluate for seizure  Brief clinical history: This is a 13 woman with history of renal transplant now with altered mental status.  Medications:  Current Facility-Administered Medications:  .  0.9 %  sodium chloride infusion, 250 mL, Intravenous, PRN, Courage Emokpae, MD .  0.9 %  sodium chloride infusion, , Intravenous, Continuous, Belkys A Regalado, MD .  0.9 %  sodium chloride infusion, , Intravenous, Continuous, Belkys A Regalado, MD .  0.9 %  sodium chloride infusion, , Intravenous, Continuous, Belkys A Regalado, MD .  0.9 %  sodium chloride infusion, , Intravenous, Continuous, Belkys A Regalado, MD, Last Rate: 125 mL/hr at 04/11/16 1537 .  acetaminophen (TYLENOL) tablet 650 mg, 650 mg, Oral, Q6H PRN **OR** acetaminophen (TYLENOL) suppository 650 mg, 650 mg, Rectal, Q6H PRN, Shon Hale, MD, 650 mg at 04/10/16 2308 .  acyclovir (ZOVIRAX) 800 mg in dextrose 5 % 150 mL IVPB, 10 mg/kg (Adjusted), Intravenous, Q8H, Belkys A Regalado, MD, 800 mg at 04/11/16 1415 .  albuterol (PROVENTIL) (2.5 MG/3ML) 0.083% nebulizer solution 2.5 mg, 2.5 mg, Nebulization, Q2H PRN, Shon Hale, MD .  ampicillin (OMNIPEN) 1 g in sodium chloride 0.9 % 50 mL IVPB, 1 g, Intravenous, Q6H, Belkys A Regalado, MD, 1 g at 04/11/16 0935 .  aspirin EC tablet 81 mg, 81 mg, Oral, Daily, Courage Emokpae, MD .  carvedilol (COREG) tablet 25 mg, 25 mg, Oral, BID WC, Courage Emokpae, MD .  ceFEPIme (MAXIPIME) 2 g in dextrose 5 % 50 mL IVPB, 2 g, Intravenous, Q8H, Belkys A Regalado, MD, 2 g at 04/11/16 1250 .  dextrose 5 %-0.45 % sodium chloride infusion, , Intravenous, Continuous, Belkys A Regalado, MD .  dextrose 5 %-0.45 % sodium chloride infusion, , Intravenous, Continuous, Belkys A Regalado, MD .  famotidine (PEPCID) tablet 20 mg, 20 mg, Oral, Daily, Courage Emokpae, MD .  feeding supplement (GLUCERNA  SHAKE) (GLUCERNA SHAKE) liquid 237 mL, 237 mL, Oral, Daily PRN, Shon Hale, MD .  hydrALAZINE (APRESOLINE) injection 10 mg, 10 mg, Intravenous, Q6H PRN, Shon Hale, MD, 10 mg at 04/10/16 1818 .  insulin regular (NOVOLIN R,HUMULIN R) 250 Units in sodium chloride 0.9 % 250 mL (1 Units/mL) infusion, , Intravenous, Continuous, Belkys A Regalado, MD .  insulin regular (NOVOLIN R,HUMULIN R) 250 Units in sodium chloride 0.9 % 250 mL (1 Units/mL) infusion, , Intravenous, Continuous, Belkys A Regalado, MD, Last Rate: 3.4 mL/hr at 04/11/16 1537, 3.4 Units/hr at 04/11/16 1537 .  labetalol (NORMODYNE,TRANDATE) injection 10 mg, 10 mg, Intravenous, Q4H PRN, Shon Hale, MD .  levothyroxine (SYNTHROID, LEVOTHROID) tablet 137 mcg, 137 mcg, Oral, QAC breakfast, Shon Hale, MD .  mycophenolate (CELLCEPT) capsule 500 mg, 500 mg, Oral, BID, Courage Emokpae, MD .  nystatin cream (MYCOSTATIN), , Topical, BID, Courage Emokpae, MD .  ondansetron (ZOFRAN) tablet 4 mg, 4 mg, Oral, Q6H PRN **OR** ondansetron (ZOFRAN) injection 4 mg, 4 mg, Intravenous, Q6H PRN, Shon Hale, MD .  polyethylene glycol (MIRALAX / GLYCOLAX) packet 17 g, 17 g, Oral, Daily PRN, Shon Hale, MD .  potassium chloride 10 mEq in 100 mL IVPB, 10 mEq, Intravenous, Q1H, Belkys A Regalado, MD .  predniSONE (DELTASONE) tablet 5 mg, 5 mg, Oral, Q breakfast, Courage Emokpae, MD .  senna (SENOKOT) tablet 8.6 mg, 1 tablet, Oral, BID, Courage Emokpae, MD .  sodium chloride flush (NS) 0.9 % injection 3 mL, 3 mL, Intravenous, Q12H, Courage  Emokpae, MD, 3 mL at 04/11/16 0930 .  sodium chloride flush (NS) 0.9 % injection 3 mL, 3 mL, Intravenous, PRN, Shon Hale, MD .  sodium chloride flush (NS) 0.9 % injection 3 mL, 3 mL, Intravenous, Q12H, Shon Hale, MD, 3 mL at 04/11/16 0930 .  tacrolimus (PROGRAF) capsule 1 mg, 1 mg, Oral, BID, Courage Emokpae, MD .  traZODone (DESYREL) tablet 50 mg, 50 mg, Oral, QHS PRN, Shon Hale,  MD .  Melene Muller ON 04/12/2016] vancomycin (VANCOCIN) 1,500 mg in sodium chloride 0.9 % 500 mL IVPB, 1,500 mg, Intravenous, Q12H, Alba Cory, MD  Description: This is a routine EEG performed using standard international 10-20 electrode placement. A total of 18 channels are recorded, including one for the EKG.  Activating Maneuvers: None  Findings:  The EKG channel demonstrates are regular rhythm with a rate of 110-120 beats per minute.   The background in the right cerebral hemisphere consists predominantly of alpha activity with average frequency of about 8 Hz. There is a moderate degree of intermixed theta activity superimposed upon this. Voltages are normal in the right cerebral hemisphere. This reacts as expected with eye opening.  In the left cerebral hemisphere, there is moderate diffuse generalized slowing that consists of a combination of delta activity with intermixed theta. Voltages are moderately reduced in the left cerebral hemisphere compared to the right. No significant change with eye opening.  There are no focal asymmetries. No epileptiform discharges are present. No seizures are recorded.   Impression: This is an abnormal EEG due to the presence of moderate to severe slowing with reduced amplitudes in the left cerebral hemisphere. This is indicative of cerebral dysfunction in this area, nonspecific as to etiology. This pattern can be seen with ischemia, hypoperfusion, infection, and postictal states among others. Clinical correlation would be required. There is nothing to support ongoing seizure activity on this recording.  Rhona Leavens, MD [facility]

## 2016-04-11 NOTE — Progress Notes (Signed)
Patients husband and son in to visit patient.  When leaving the spouse stopped by the nursing station to say "she hugged her son.  I want the doctor to know that".  They were appreciative of care given to patient.

## 2016-04-11 NOTE — Progress Notes (Signed)
Pharmacy Antibiotic Note  [name] is a 50 y.o. female admitted on 04/10/2016 with AMS, now with fevers and possible sepsis  Pharmacy has been consulted for Vancomycin and Zosyn  dosing.  Plan: Vancomycin 1500 mg IV now, then 1 g IV q12h Zosyn 3.375 g IV q8h      Temp (24hrs), Avg:100 F (37.8 C), Min:98.1 F (36.7 C), Max:102.4 F (39.1 C)   Recent Labs Lab 04/10/16 0253 04/10/16 0307 04/10/16 0308 04/10/16 1137 04/11/16 0503  WBC 9.7  --   --  9.4 8.3  CREATININE 1.60* 1.50*  --  1.30*  --   LATICACIDVEN  --   --  1.69  --   --     CrCl cannot be calculated (Unknown ideal weight.).    Allergies  Allergen Reactions  . Ace Inhibitors Cough    Lisinopril  . Adhesive [Tape] Itching    Eddie Candle 04/11/2016 7:28 AM

## 2016-04-12 ENCOUNTER — Inpatient Hospital Stay (HOSPITAL_COMMUNITY): Payer: Medicare Other

## 2016-04-12 LAB — BASIC METABOLIC PANEL
ANION GAP: 7 (ref 5–15)
Anion gap: 10 (ref 5–15)
Anion gap: 10 (ref 5–15)
Anion gap: 7 (ref 5–15)
Anion gap: 6 (ref 5–15)
BUN: 18 mg/dL (ref 6–20)
BUN: 18 mg/dL (ref 6–20)
BUN: 18 mg/dL (ref 6–20)
BUN: 15 mg/dL (ref 6–20)
BUN: 13 mg/dL (ref 6–20)
CALCIUM: 9.2 mg/dL (ref 8.9–10.3)
CALCIUM: 8.3 mg/dL — AB (ref 8.9–10.3)
CALCIUM: 8.6 mg/dL — AB (ref 8.9–10.3)
CALCIUM: 8.7 mg/dL — AB (ref 8.9–10.3)
CHLORIDE: 111 mmol/L (ref 101–111)
CO2: 20 mmol/L — AB (ref 22–32)
CO2: 18 mmol/L — ABNORMAL LOW (ref 22–32)
CO2: 13 mmol/L — ABNORMAL LOW (ref 22–32)
CO2: 17 mmol/L — ABNORMAL LOW (ref 22–32)
CO2: 16 mmol/L — ABNORMAL LOW (ref 22–32)
CREATININE: 1.47 mg/dL — AB (ref 0.44–1.00)
CREATININE: 1.39 mg/dL — AB (ref 0.44–1.00)
CREATININE: 1.33 mg/dL — AB (ref 0.44–1.00)
Calcium: 8.9 mg/dL (ref 8.9–10.3)
Chloride: 109 mmol/L (ref 101–111)
Chloride: 109 mmol/L (ref 101–111)
Chloride: 113 mmol/L — ABNORMAL HIGH (ref 101–111)
Chloride: 114 mmol/L — ABNORMAL HIGH (ref 101–111)
Creatinine, Ser: 1.48 mg/dL — ABNORMAL HIGH (ref 0.44–1.00)
Creatinine, Ser: 1.56 mg/dL — ABNORMAL HIGH (ref 0.44–1.00)
GFR calc Af Amer: 47 mL/min — ABNORMAL LOW (ref >60)
GFR calc Af Amer: 44 mL/min — ABNORMAL LOW (ref >60)
GFR calc Af Amer: 47 mL/min — ABNORMAL LOW (ref >60)
GFR calc Af Amer: 51 mL/min — ABNORMAL LOW (ref >60)
GFR calc Af Amer: 53 mL/min — ABNORMAL LOW (ref >60)
GFR calc non Af Amer: 40 mL/min — ABNORMAL LOW (ref >60)
GFR calc non Af Amer: 38 mL/min — ABNORMAL LOW (ref >60)
GFR, EST NON AFRICAN AMERICAN: 41 mL/min — AB (ref >60)
GFR, EST NON AFRICAN AMERICAN: 44 mL/min — AB (ref >60)
GFR, EST NON AFRICAN AMERICAN: 46 mL/min — AB (ref >60)
GLUCOSE: 105 mg/dL — AB (ref 65–99)
GLUCOSE: 189 mg/dL — AB (ref 65–99)
GLUCOSE: 141 mg/dL — AB (ref 65–99)
GLUCOSE: 223 mg/dL — AB (ref 65–99)
Glucose, Bld: 159 mg/dL — ABNORMAL HIGH (ref 65–99)
POTASSIUM: 3.9 mmol/L (ref 3.5–5.1)
POTASSIUM: 4.8 mmol/L (ref 3.5–5.1)
Potassium: 3.8 mmol/L (ref 3.5–5.1)
Potassium: 3.6 mmol/L (ref 3.5–5.1)
Potassium: 3.5 mmol/L (ref 3.5–5.1)
SODIUM: 132 mmol/L — AB (ref 135–145)
Sodium: 138 mmol/L (ref 135–145)
Sodium: 137 mmol/L (ref 135–145)
Sodium: 137 mmol/L (ref 135–145)
Sodium: 136 mmol/L (ref 135–145)

## 2016-04-12 LAB — GLUCOSE, CAPILLARY
GLUCOSE-CAPILLARY: 103 mg/dL — AB (ref 65–99)
GLUCOSE-CAPILLARY: 164 mg/dL — AB (ref 65–99)
GLUCOSE-CAPILLARY: 197 mg/dL — AB (ref 65–99)
GLUCOSE-CAPILLARY: 193 mg/dL — AB (ref 65–99)
GLUCOSE-CAPILLARY: 129 mg/dL — AB (ref 65–99)
GLUCOSE-CAPILLARY: 128 mg/dL — AB (ref 65–99)
Glucose-Capillary: 103 mg/dL — ABNORMAL HIGH (ref 65–99)
Glucose-Capillary: 112 mg/dL — ABNORMAL HIGH (ref 65–99)
Glucose-Capillary: 123 mg/dL — ABNORMAL HIGH (ref 65–99)
Glucose-Capillary: 124 mg/dL — ABNORMAL HIGH (ref 65–99)
Glucose-Capillary: 129 mg/dL — ABNORMAL HIGH (ref 65–99)
Glucose-Capillary: 138 mg/dL — ABNORMAL HIGH (ref 65–99)
Glucose-Capillary: 142 mg/dL — ABNORMAL HIGH (ref 65–99)
Glucose-Capillary: 167 mg/dL — ABNORMAL HIGH (ref 65–99)
Glucose-Capillary: 173 mg/dL — ABNORMAL HIGH (ref 65–99)
Glucose-Capillary: 180 mg/dL — ABNORMAL HIGH (ref 65–99)
Glucose-Capillary: 185 mg/dL — ABNORMAL HIGH (ref 65–99)
Glucose-Capillary: 186 mg/dL — ABNORMAL HIGH (ref 65–99)
Glucose-Capillary: 190 mg/dL — ABNORMAL HIGH (ref 65–99)
Glucose-Capillary: 191 mg/dL — ABNORMAL HIGH (ref 65–99)
Glucose-Capillary: 202 mg/dL — ABNORMAL HIGH (ref 65–99)
Glucose-Capillary: 208 mg/dL — ABNORMAL HIGH (ref 65–99)
Glucose-Capillary: 278 mg/dL — ABNORMAL HIGH (ref 65–99)

## 2016-04-12 LAB — TACROLIMUS LEVEL: Tacrolimus (FK506) - LabCorp: 12.1 ng/mL (ref 2.0–20.0)

## 2016-04-12 LAB — CBC
HEMATOCRIT: 35.9 % — AB (ref 36.0–46.0)
HEMOGLOBIN: 11 g/dL — AB (ref 12.0–15.0)
MCH: 27.7 pg (ref 26.0–34.0)
MCHC: 30.6 g/dL (ref 30.0–36.0)
MCV: 90.4 fL (ref 78.0–100.0)
Platelets: 153 10*3/uL (ref 150–400)
RBC: 3.97 MIL/uL (ref 3.87–5.11)
RDW: 14.6 % (ref 11.5–15.5)
WBC: 11.5 10*3/uL — ABNORMAL HIGH (ref 4.0–10.5)

## 2016-04-12 LAB — URINE CULTURE

## 2016-04-12 MED ORDER — SODIUM CHLORIDE 0.9 % IV BOLUS (SEPSIS)
1000.0000 mL | Freq: Once | INTRAVENOUS | Status: AC
  Administered 2016-04-12: 1000 mL via INTRAVENOUS

## 2016-04-12 MED ORDER — SODIUM CHLORIDE 0.9 % IV BOLUS (SEPSIS)
500.0000 mL | Freq: Once | INTRAVENOUS | Status: AC
  Administered 2016-04-12: 500 mL via INTRAVENOUS

## 2016-04-12 MED ORDER — POTASSIUM CHLORIDE CRYS ER 20 MEQ PO TBCR
40.0000 meq | EXTENDED_RELEASE_TABLET | Freq: Once | ORAL | Status: AC
  Administered 2016-04-12: 40 meq via ORAL
  Filled 2016-04-12: qty 2

## 2016-04-12 MED ORDER — POTASSIUM CHLORIDE 20 MEQ/15ML (10%) PO SOLN
40.0000 meq | Freq: Once | ORAL | Status: AC
Start: 2016-04-12 — End: 2016-04-12
  Administered 2016-04-12: 40 meq via ORAL
  Filled 2016-04-12: qty 30

## 2016-04-12 MED ORDER — SODIUM BICARBONATE 650 MG PO TABS
650.0000 mg | ORAL_TABLET | Freq: Three times a day (TID) | ORAL | Status: DC
  Administered 2016-04-12 – 2016-04-13 (×4): 650 mg via ORAL
  Filled 2016-04-12 (×4): qty 1

## 2016-04-12 MED ORDER — POTASSIUM CHLORIDE CRYS ER 20 MEQ PO TBCR: 40.0000 meq | EXTENDED_RELEASE_TABLET | Freq: Once | ORAL | Status: DC

## 2016-04-12 NOTE — Progress Notes (Signed)
Neurology Progress Note  Subjective: She is more lucid today. She is able to answer some questions for me but is clearly still confused with poor sustained attention. She is currently denying any complaints. She has not headache or neck pain.  Current Meds:   Current Facility-Administered Medications:  .  0.9 %  sodium chloride infusion, 250 mL, Intravenous, PRN, Courage Emokpae, MD .  0.9 %  sodium chloride infusion, , Intravenous, Continuous, Belkys A Regalado, MD .  0.9 %  sodium chloride infusion, , Intravenous, Continuous, Belkys A Regalado, MD, Last Rate: 125 mL/hr at 04/11/16 1900 .  acetaminophen (TYLENOL) tablet 650 mg, 650 mg, Oral, Q6H PRN **OR** acetaminophen (TYLENOL) suppository 650 mg, 650 mg, Rectal, Q6H PRN, Shon Haleourage Emokpae, MD, 650 mg at 04/11/16 1759 .  acyclovir (ZOVIRAX) 800 mg in dextrose 5 % 150 mL IVPB, 10 mg/kg (Adjusted), Intravenous, Q8H, Crystal S Robertson, RPH, 800 mg at 04/12/16 1431 .  albuterol (PROVENTIL) (2.5 MG/3ML) 0.083% nebulizer solution 2.5 mg, 2.5 mg, Nebulization, Q2H PRN, Shon Haleourage Emokpae, MD .  ampicillin (OMNIPEN) 1 g in sodium chloride 0.9 % 50 mL IVPB, 1 g, Intravenous, Q6H, Belkys A Regalado, MD, 1 g at 04/12/16 1109 .  aspirin EC tablet 81 mg, 81 mg, Oral, Daily, Courage Emokpae, MD .  carvedilol (COREG) tablet 25 mg, 25 mg, Oral, BID WC, Shon Haleourage Emokpae, MD, 25 mg at 04/12/16 0749 .  ceFEPIme (MAXIPIME) 2 g in dextrose 5 % 50 mL IVPB, 2 g, Intravenous, Q8H, Crystal S Robertson, RPH, 2 g at 04/12/16 1320 .  dextrose 5 %-0.45 % sodium chloride infusion, , Intravenous, Continuous, Belkys A Regalado, MD, Last Rate: 125 mL/hr at 04/12/16 1115 .  famotidine (PEPCID) tablet 20 mg, 20 mg, Oral, Daily, Shon Haleourage Emokpae, MD, 20 mg at 04/12/16 1041 .  feeding supplement (GLUCERNA SHAKE) (GLUCERNA SHAKE) liquid 237 mL, 237 mL, Oral, Daily PRN, Shon Haleourage Emokpae, MD .  hydrALAZINE (APRESOLINE) injection 10 mg, 10 mg, Intravenous, Q6H PRN, Shon Haleourage Emokpae, MD, 10  mg at 04/11/16 1655 .  insulin regular (NOVOLIN R,HUMULIN R) 250 Units in sodium chloride 0.9 % 250 mL (1 Units/mL) infusion, , Intravenous, Continuous, Belkys A Regalado, MD, Last Rate: 4.6 mL/hr at 04/12/16 1532, 4.6 Units/hr at 04/12/16 1532 .  labetalol (NORMODYNE,TRANDATE) injection 10 mg, 10 mg, Intravenous, Q4H PRN, Shon Haleourage Emokpae, MD .  levothyroxine (SYNTHROID, LEVOTHROID) tablet 137 mcg, 137 mcg, Oral, QAC breakfast, Shon Haleourage Emokpae, MD, 137 mcg at 04/12/16 0748 .  mycophenolate (CELLCEPT) capsule 500 mg, 500 mg, Oral, BID, Shon Haleourage Emokpae, MD, 500 mg at 04/12/16 1042 .  nystatin cream (MYCOSTATIN), , Topical, BID, Courage Emokpae, MD .  ondansetron (ZOFRAN) tablet 4 mg, 4 mg, Oral, Q6H PRN **OR** ondansetron (ZOFRAN) injection 4 mg, 4 mg, Intravenous, Q6H PRN, Courage Emokpae, MD .  polyethylene glycol (MIRALAX / GLYCOLAX) packet 17 g, 17 g, Oral, Daily PRN, Shon Haleourage Emokpae, MD .  predniSONE (DELTASONE) tablet 5 mg, 5 mg, Oral, Q breakfast, Shon Haleourage Emokpae, MD, 5 mg at 04/12/16 0749 .  sodium bicarbonate tablet 650 mg, 650 mg, Oral, TID, Belkys A Regalado, MD, 650 mg at 04/12/16 1529 .  sodium chloride flush (NS) 0.9 % injection 3 mL, 3 mL, Intravenous, Q12H, Shon Haleourage Emokpae, MD, 3 mL at 04/12/16 1043 .  sodium chloride flush (NS) 0.9 % injection 3 mL, 3 mL, Intravenous, PRN, Shon Haleourage Emokpae, MD .  sodium chloride flush (NS) 0.9 % injection 3 mL, 3 mL, Intravenous, Q12H, Shon Haleourage Emokpae, MD, 3 mL at 04/12/16 1043 .  tacrolimus (PROGRAF) capsule 1 mg, 1 mg, Oral, BID, Shon Hale, MD, 1 mg at 04/12/16 1041 .  traZODone (DESYREL) tablet 50 mg, 50 mg, Oral, QHS PRN, Shon Hale, MD .  vancomycin (VANCOCIN) 1,500 mg in sodium chloride 0.9 % 500 mL IVPB, 1,500 mg, Intravenous, Q12H, Belkys A Regalado, MD, 1,500 mg at 04/12/16 1108  Objective:  Temp:  [98 F (36.7 C)-99 F (37.2 C)] 98 F (36.7 C) (08/06 1520) Pulse Rate:  [90-122] 90 (08/06 1520) Resp:  [15-31] 17 (08/06  1520) BP: (105-176)/(58-82) 142/70 (08/06 1520) SpO2:  [92 %-98 %] 96 % (08/06 1520)  General: WDWN Caucasian woman in NAD. She is alert, oriented to self only. There is a paucity of spontaneous speech but she will answer questions appropriately with brief responses. Psychomotor slowing is evident. Speech is mildly slurred.  HEENT: Neck is supple without lymphadenopathy. Mucous membranes are dry and the oropharynx is clear. Sclerae are anicteric. There is mild conjunctival injection.  CV: Regular, no murmur. Carotid pulses are 2+ and symmetric with no bruits. Distal pulses 2+ and symmetric.  Lungs: CTAB  Neuro: MS: As noted above. No aphasia.  CN: Pupils are equal and reactive from 3-->2 mm bilaterally. She appears to blink to visual threat from all quadrants but does not cooperate with confrontational testing. EOMI intact with breakup of smooth pursuits in all directions of gaze.  No nystagmus. Facial sensation appears intact to light touch. Face is symmetric at rest with normal strength and mobility. Hearing is intact to conversational voice. Voice is normal in tone and quality. Palate elevates symmetrically. Uvula is midline. Tongue is midline with normal bulk and mobility.  Motor: Normal bulk and tone. She gives variable effort with confrontational strength testing but has at least 4/5 strength throughout that is symmetric. No tremor or other abnormal movements are observed.  Sensation: Appears intact to light touch but attention is inconsistent.  DTRs: 2+, symmetric in the arms, absent in the legs.  Coordination and gait: These cannot be assessed as she does not participate with the exam.    Labs: Lab Results  Component Value Date   WBC 11.5 (H) 04/12/2016   HGB 11.0 (L) 04/12/2016   HCT 35.9 (L) 04/12/2016   PLT 153 04/12/2016   GLUCOSE 189 (H) 04/12/2016   CHOL 167 06/08/2013   TRIG 146 06/08/2013   HDL 49 06/08/2013   LDLCALC 89 06/08/2013   ALT 15 04/11/2016   AST 13 (L)  04/11/2016   NA 132 (L) 04/12/2016   K 3.6 04/12/2016   CL 109 04/12/2016   CREATININE 1.47 (H) 04/12/2016   BUN 18 04/12/2016   CO2 13 (L) 04/12/2016   TSH 1.824 04/10/2016   INR 1.16 04/11/2016   HGBA1C 12.3 (H) 03/30/2016   CBC Latest Ref Rng & Units 04/12/2016 04/11/2016 04/10/2016  WBC 4.0 - 10.5 K/uL 11.5(H) 8.3 9.4  Hemoglobin 12.0 - 15.0 g/dL 11.0(L) 11.9(L) 12.1  Hematocrit 36.0 - 46.0 % 35.9(L) 37.7 39.4  Platelets 150 - 400 K/uL 153 177 216    Lab Results  Component Value Date   HGBA1C 12.3 (H) 03/30/2016   Lab Results  Component Value Date   ALT 15 04/11/2016   AST 13 (L) 04/11/2016   ALKPHOS 96 04/11/2016   BILITOT 2.0 (H) 04/11/2016    Radiology:  There is no new neuroimaging for review.  Other diagnostic studies:  EEG showed low voltage and slowing in the left cerebral hemisphere with no epileptiform discharges or seizure.  A/P:   1. Encephalopathy: This is acute, likely multifactorial in etiology. She is better today but still confused. She has UTI and there was some question of hypoglycemia at home. She has been markedly hyperglycemic at times during this admission. CNS infection remains a possibility but her level of agitation precludes LP at this time. This can be revisited as her mental status clears and if clinical suspicion warrants. For now, continue empiric antibiotic coverage with ampicillin, cefepime, vancomycin, and acyclovir. Continue to optimize metabolic status as you are. Avoid sedating medications, specifically benzos, opiates and anything with strong anticholinergic properties.   Rhona Leavens, MD [facility]

## 2016-04-12 NOTE — Progress Notes (Signed)
Patient removed Cortrak pt. Passed a swallow evaluation and placed on a Dysphagia 2 diet.  Contacted Dr. Sunnie Nielsenegalado and updated on status of Cortrak received no new orders at this time.  Cortrak bagged and placed at head of patients bed.

## 2016-04-12 NOTE — Progress Notes (Signed)
PROGRESS NOTE    [name]  ZOX:[id number] DOB: [birth year] DOA: 04/10/2016 PCP: No primary care provider on file.  Brief Narrative: [name]  is a 50 y.o. female, With past medical history relevant for end-stage renal disease status post kidney transplant in 2011 (currently on antirejection drugs), hypertension , hypothyroidism uncontrolled diabetes with erratic blood glucose, recent admission with blood sugars over 700, however home patient has been having recurrent episodes of symptomatic hypoglycemia. She presents by EMS today after another episode of hypoglycemia. Patient is very confused and unable to give any relevant history, I called and spoke with patient's husband by phone, according to patient's husband patient was in the usual state of health on 04/09/2016 when she went to bed, around 1 AM on 04/10/2016 patient's son found her confused and very diaphoretic. Patient's husband gave over 1 L of regular Coca-Cola and a candy bar. Patient sugars were not checked, patient remained diaphoretic and confused about an hour after drinking regular Coca-Cola and eating the candy bar so patient's husband called EMS. When EMS arrived at the home patient glucose was 90, it is conceivable with the patient glucose was probably much lower prior to drinking a Coke and eating a candy bar about an hour prior. bruising/ecchymosis in lower abdominal areas noted, patient's husband denies any trauma   ED Course:- CT head, MRI brain without acute findings, UA and chest x-ray without evidence of significant infection. No fevers, no leukocytosis, patient remained confused. Urine drug screen was unremarkable   Assessment & Plan:   Principal Problem:   Altered mental status Active Problems:   Essential hypertension   History of renal transplant   Diabetes (HCC)   Pressure ulcer   Acute encephalopathy   Altered mental state  1-Acute encephalopathy; fevers,  Concern for meningitis/encephalitis,  patient with AMS, fever, no focal evidence of infection, on immune suppressive medications.  Continue with  Vancomycin, ampicillin , cefepime. Acyclovir.  Neurology consulted , defer to neuro LP.  EEG negative for seizure.  MRI negative for stroke.  More alert today.   2-DM; initially with concern of hypoglycemia. Develops DKA. Gap close , bicarb at 18, continue with Insulin Drip until bicarb more than 20. IV fluids.   3-AKI, History of renal transplant on immunosuppressive.  Panda tube was placed,  Continue with ; prednisone, Cellcept, Prograf.  IV fluids, appears dehydrated.  Renal function improving, suspect component hypovolemia, DKA.  Low threshold to consult nephrology if renal function gets worse.  Urine out put: 2.1 yesterday   4-Possible sepsis; fever, tachycardia, tachypnea.  IV fluids. Lactic acid 1.1. IV antibiotics.  Follow urine culture, blood culture. CT abdomen negative for infection.  Coughing this am, check chest x ray.   5-Hypothyroidism; continue with synthroid.   DVT prophylaxis:  Code Status: full code.  Family Communication: none at bedside.  Disposition Plan: Remain in the step down unit.    Consultants:   Neurology   Procedures:   EEG  Antimicrobials:   Vancomycin 8-05  Cefepime 8-05  Ampicillin 8-05  Acyclovir 8/05   Subjective: Alert this morning, answer few questions. Moves extremities.    Objective: Vitals:   04/11/16 2300 04/12/16 0000 04/12/16 0502 04/12/16 0742  BP: (!) 105/58 122/63 107/71 138/60  Pulse: (!) 101 96 93 97  Resp: 20 19 (!) 29 19  Temp:  99 F (37.2 C) 98.8 F (37.1 C) 98.2 F (36.8 C)  TempSrc:  Oral Oral Oral  SpO2: 92% 96% 97% 98%  Weight:  Height:        Intake/Output Summary (Last 24 hours) at 04/12/16 0805 Last data filed at 04/12/16 0503  Gross per 24 hour  Intake              341 ml  Output             2500 ml  Net            -2159 ml   Filed Weights   04/11/16 0800  Weight: 104.3  kg (230 lb)    Examination:  General exam: Appears calm and comfortable  Respiratory system: Clear to auscultation. Respiratory effort normal. Cardiovascular system: S1 & S2 heard, RRR. No JVD, murmurs, rubs, gallops or clicks. No pedal edema. Gastrointestinal system: Abdomen is nondistended, soft and nontender. No organomegaly or masses felt. Normal bowel sounds heard. Central nervous system: More alert, follow some command, answer some questions. Moves extremities.  Extremities: Symmetric 5 x 5 power. Skin: No rashes, lesions or ulcers     Data Reviewed: I have personally reviewed following labs and imaging studies  CBC:  Recent Labs Lab 04/10/16 0253 04/10/16 0307 04/10/16 1137 04/11/16 0503 04/12/16 0359  WBC 9.7  --  9.4 8.3 11.5*  NEUTROABS 8.0*  --   --   --   --   HGB 13.0 13.6 12.1 11.9* 11.0*  HCT 41.8 40.0 39.4 37.7 35.9*  MCV 90.1  --  89.5 91.7 90.4  PLT 232  --  216 177 153   Basic Metabolic Panel:  Recent Labs Lab 04/11/16 1453 04/11/16 1815 04/11/16 2156 04/12/16 0359 04/12/16 0636  NA 134* 136 138 138 137  K 5.3* 3.9 3.8 3.9 3.8  CL 106 109 112* 111 109  CO2 10* 18* 20* 20* 18*  GLUCOSE 424* 262* 192* 105* 159*  BUN CREATININE 1.63* 1.53* 1.46* 1.48* 1.56*  CALCIUM 9.2 9.4 9.2 9.2 8.9   GFR: Estimated Creatinine Clearance: 55.2 mL/min (by C-G formula based on SCr of 1.56 mg/dL). Liver Function Tests:  Recent Labs Lab 04/10/16 0253 04/11/16 0911  AST 17 13*  ALT 20 15  ALKPHOS 103 96  BILITOT 0.4 2.0*  PROT 6.4* 5.9*  ALBUMIN 2.9* 2.5*   No results for input(s): LIPASE, AMYLASE in the last 168 hours.  Recent Labs Lab 04/10/16 1137  AMMONIA 28   Coagulation Profile:  Recent Labs Lab 04/10/16 0253 04/11/16 0911  INR 0.94 1.16   Cardiac Enzymes:  Recent Labs Lab 04/10/16 0253  TROPONINI 0.03*   BNP (last 3 results) No results for input(s): PROBNP in the last 8760 hours. HbA1C: No results for  input(s): HGBA1C in the last 72 hours. CBG:  Recent Labs Lab 04/12/16 0210 04/12/16 0316 04/12/16 0418 04/12/16 0528 04/12/16 0631  GLUCAP 112* 103* 103* 124* 164*   Lipid Profile: No results for input(s): CHOL, HDL, LDLCALC, TRIG, CHOLHDL, LDLDIRECT in the last 72 hours. Thyroid Function Tests:  Recent Labs  04/10/16 1137  TSH 1.824   Anemia Panel: No results for input(s): VITAMINB12, FOLATE, FERRITIN, TIBC, IRON, RETICCTPCT in the last 72 hours. Sepsis Labs:  Recent Labs Lab 04/10/16 0308 04/11/16 0911  PROCALCITON  --  <0.10  LATICACIDVEN 1.69 1.5    Recent Results (from the past 240 hour(s))  Urine culture     Status: Abnormal (Preliminary result)   Collection Time: 04/10/16  4:30 AM  Result Value Ref Range Status   Specimen Description URINE, RANDOM  Final  Special Requests NONE  Final   Culture >=100,000 COLONIES/mL GRAM NEGATIVE RODS (A)  Final   Report Status PENDING  Incomplete  Blood culture (routine x 2)     Status: None (Preliminary result)   Collection Time: 04/10/16  6:00 AM  Result Value Ref Range Status   Specimen Description BLOOD LEFT ARM  Final   Special Requests BOTTLES DRAWN AEROBIC AND ANAEROBIC 10CC  Final   Culture NO GROWTH 1 DAY  Final   Report Status PENDING  Incomplete  Blood culture (routine x 2)     Status: None (Preliminary result)   Collection Time: 04/10/16  6:05 AM  Result Value Ref Range Status   Specimen Description BLOOD LEFT HAND  Final   Special Requests IN PEDIATRIC BOTTLE 4CC  Final   Culture NO GROWTH 1 DAY  Final   Report Status PENDING  Incomplete         Radiology Studies: Dg Chest Port 1 View  Result Date: 04/11/2016 CLINICAL DATA:  Sepsis EXAM: PORTABLE CHEST 1 VIEW COMPARISON:  04/10/2016 FINDINGS: Normal mediastinum and cardiac silhouette. Normal pulmonary vasculature. No evidence of effusion, infiltrate, or pneumothorax. No acute bony abnormality. IMPRESSION: No acute cardiopulmonary process.  Electronically Signed   By: Genevive Bi M.D.   On: 04/11/2016 08:21   Dg Abd Portable 1v  Result Date: 04/11/2016 CLINICAL DATA:  50 year old female with history of feeding tube placement. EXAM: PORTABLE ABDOMEN - 1 VIEW COMPARISON:  Abdominal radiograph 10/23/2014. FINDINGS: Tip of feeding tube is in the proximal body of the stomach. Nonspecific bowel-gas pattern with nondilated gas-filled loops of small bowel in the left side of the abdomen, and otherwise relative paucity of bowel gas. IMPRESSION: 1. Tip of feeding tube is in the proximal body of the stomach. Electronically Signed   By: Trudie Reed M.D.   On: 04/11/2016 17:11        Scheduled Meds: . acyclovir  10 mg/kg (Adjusted) Intravenous Q8H  . ampicillin (OMNIPEN) IV  1 g Intravenous Q6H  . aspirin EC  81 mg Oral Daily  . carvedilol  25 mg Oral BID WC  . ceFEPime (MAXIPIME) IV  2 g Intravenous Q8H  . famotidine  20 mg Oral Daily  . levothyroxine  137 mcg Oral QAC breakfast  . mycophenolate  500 mg Oral BID  . nystatin cream   Topical BID  . predniSONE  5 mg Oral Q breakfast  . senna  1 tablet Oral BID  . sodium chloride  500 mL Intravenous Once  . sodium chloride flush  3 mL Intravenous Q12H  . sodium chloride flush  3 mL Intravenous Q12H  . tacrolimus  1 mg Oral BID  . vancomycin  1,500 mg Intravenous Q12H   Continuous Infusions: . sodium chloride    . sodium chloride 125 mL/hr at 04/11/16 1900  . dextrose 5 % and 0.45% NaCl    . dextrose 5 % and 0.45% NaCl 125 mL/hr at 04/11/16 1858  . insulin (NOVOLIN-R) infusion    . insulin (NOVOLIN-R) infusion 2.3 Units/hr (04/12/16 0745)     LOS: 1 day    Time spent: 35 minutes.     Alba Cory, MD [facility] Pager [phone]  If 7PM-7AM, please contact night-coverage [email or web address] Password [email or web address] 04/12/2016, 8:05 AM

## 2016-04-12 NOTE — Progress Notes (Signed)
Patients CO2 is a 13 contacted Dr. Sunnie Nielsenegalado and updated on patients condition received orders for a fluid bolus.  Will continue to monitor.

## 2016-04-12 NOTE — Evaluation (Signed)
Clinical/Bedside Swallow Evaluation Patient Details  Name: [name] MRN: [id number] Date of Birth: [birth year]  Today's Date: 04/12/2016 Time: SLP Start Time (ACUTE ONLY): 7829 SLP Stop Time (ACUTE ONLY): 0933 SLP Time Calculation (min) (ACUTE ONLY): 15 min  Past Medical History:  Past Medical History:  Diagnosis Date  . Arthritis    "elbows, knees, legs, back" (09/24/2015)  . Daily headache   . Depression    "years ago"  . End stage renal disease (HCC)    right arm AV graft, post transplant  . Hypertension   . Hypothyroid   . 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 Kidney disease   . Pneumonia ~ 2007?  [name] Type II diabetes mellitus (HCC)    Insulin dependant   Past Surgical History:  Past Surgical History:  Procedure Laterality Date  . AMPUTATION Left 05/11/2014   Procedure: AMPUTATION LEFT GREAT TOE;  Surgeon: Kathryne Hitch, MD;  Location: [facility];  Service: Orthopedics;  Laterality: Left;  . AV FISTULA PLACEMENT Right    forearm  . BACK SURGERY    . CATARACT EXTRACTION W/ INTRAOCULAR LENS  IMPLANT, BILATERAL Bilateral   . CESAREAN SECTION  07/1999  . DG AV DIALYSIS GRAFT DECLOT OR    . INCISION AND DRAINAGE Right 06/02/2015   Procedure: INCISION AND DRAINAGE OF RIGHT 3rd RAY RESECTION;  Surgeon: Kathryne Hitch, MD;  Location: [facility];  Service: Orthopedics;  Laterality: Right;  . 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 LUMBAR DISC SURGERY  2001  . NEPHRECTOMY TRANSPLANTED ORGAN    . TOE AMPUTATION Right    1,2 & 3rd toes.  . TUBAL LIGATION  07/1999   HPI:  Pt is a 50 y.o. female with PMH of ESRD, headache, HTN, immunosuppression, and type II DM admitted 8/4 with AMS onset PTA, initially admitted for hypoglycemia. MRI/ EEG negative, CXR clear. Pt continues to be encephalopathic, reoprtedly not following commands, answering a few questions this a.m., has Cortrak. Bedside swallow eval ordered to assess swallowing/ recommend safest  diet.   Assessment / Plan / Recommendation Clinical Impression  Pt had no immediate overt s/s of aspiration. Did have a dry cough intermittently which was occurring prior to providing any PO intake. Swallow initiation appeared timely, no oral residuals or pocketing noted. Pt with missing dentition on top and had prolonged mastication of regular solid food. Pt able to follow some 1-step directions, able to state name but unable to state date of birth or answer other orientation questions. Given this and continued AMS, recommend initiating dysphagia 2 consistency, thin liquids, meds crushed in puree, full supervision to assist with feeding, monitor alertness, cue for small bites/ sips, ensure pt sitting upright 30-60 minutes after meal. Will continue to follow for diet tolerance/ consider advancement.    Aspiration Risk  Mild aspiration risk    Diet Recommendation Dysphagia 2 (Fine chop);Thin liquid   Liquid Administration via: Straw Medication Administration: Crushed with puree Supervision: Staff to assist with self feeding;Full supervision/cueing for compensatory strategies Compensations: Slow rate;Small sips/bites;Minimize environmental distractions Postural Changes: Seated upright at 90 degrees;Remain upright for at least 30 minutes after po intake    Other  Recommendations Oral Care Recommendations: Oral care BID   Follow up Recommendations  24 hour supervision/assistance    Frequency and Duration min 2x/week  2 weeks       Prognosis Prognosis for Safe Diet Advancement: Good Barriers to Reach Goals: Cognitive deficits  Swallow Study   General HPI: Pt is a 50 y.o. female with PMH of ESRD, headache, HTN, immunosuppression, and type II DM admitted 8/4 with AMS onset PTA, initially admitted for hypoglycemia. MRI/ EEG negative, CXR clear. Pt continues to be encephalopathic, reoprtedly not following commands, answering a few questions this a.m., has Cortrak. Bedside swallow eval  ordered to assess swallowing/ recommend safest diet. Type of Study: Bedside Swallow Evaluation Diet Prior to this Study: NPO Temperature Spikes Noted:  (low grade) Respiratory Status: Nasal cannula History of Recent Intubation: No Behavior/Cognition: Alert;Cooperative;Pleasant mood;Requires cueing Oral Cavity Assessment: Within Functional Limits Oral Care Completed by SLP: Yes Oral Cavity - Dentition: Missing dentition Vision: Functional for self-feeding Self-Feeding Abilities: Needs assist Patient Positioning: Upright in bed Baseline Vocal Quality: Normal Volitional Cough: Strong Volitional Swallow: Unable to elicit    Oral/Motor/Sensory Function Overall Oral Motor/Sensory Function: Within functional limits   Ice Chips Ice chips: Within functional limits Presentation: Spoon   Thin Liquid Thin Liquid: Within functional limits Presentation: Cup;Straw    Nectar Thick Nectar Thick Liquid: Not tested   Honey Thick Honey Thick Liquid: Not tested   Puree Puree: Within functional limits Presentation: Spoon   Solid   GO   Solid: Impaired Presentation: Self Fed Oral Phase Impairments: Impaired mastication        Oleksiak, Amy K, MA, CCC-SLP 04/12/2016,9:36 AM [id number]

## 2016-04-13 DIAGNOSIS — R4182 Altered mental status, unspecified: Secondary | ICD-10-CM

## 2016-04-13 LAB — GLUCOSE, CAPILLARY
GLUCOSE-CAPILLARY: 101 mg/dL — AB (ref 65–99)
GLUCOSE-CAPILLARY: 107 mg/dL — AB (ref 65–99)
GLUCOSE-CAPILLARY: 112 mg/dL — AB (ref 65–99)
GLUCOSE-CAPILLARY: 129 mg/dL — AB (ref 65–99)
GLUCOSE-CAPILLARY: 162 mg/dL — AB (ref 65–99)
GLUCOSE-CAPILLARY: 163 mg/dL — AB (ref 65–99)
Glucose-Capillary: 108 mg/dL — ABNORMAL HIGH (ref 65–99)
Glucose-Capillary: 110 mg/dL — ABNORMAL HIGH (ref 65–99)
Glucose-Capillary: 118 mg/dL — ABNORMAL HIGH (ref 65–99)
Glucose-Capillary: 141 mg/dL — ABNORMAL HIGH (ref 65–99)
Glucose-Capillary: 148 mg/dL — ABNORMAL HIGH (ref 65–99)
Glucose-Capillary: 151 mg/dL — ABNORMAL HIGH (ref 65–99)
Glucose-Capillary: 158 mg/dL — ABNORMAL HIGH (ref 65–99)
Glucose-Capillary: 162 mg/dL — ABNORMAL HIGH (ref 65–99)
Glucose-Capillary: 169 mg/dL — ABNORMAL HIGH (ref 65–99)
Glucose-Capillary: 185 mg/dL — ABNORMAL HIGH (ref 65–99)
Glucose-Capillary: 193 mg/dL — ABNORMAL HIGH (ref 65–99)
Glucose-Capillary: 208 mg/dL — ABNORMAL HIGH (ref 65–99)
Glucose-Capillary: 209 mg/dL — ABNORMAL HIGH (ref 65–99)
Glucose-Capillary: 230 mg/dL — ABNORMAL HIGH (ref 65–99)
Glucose-Capillary: 242 mg/dL — ABNORMAL HIGH (ref 65–99)
Glucose-Capillary: 246 mg/dL — ABNORMAL HIGH (ref 65–99)

## 2016-04-13 LAB — BASIC METABOLIC PANEL
ANION GAP: 5 (ref 5–15)
ANION GAP: 7 (ref 5–15)
ANION GAP: 8 (ref 5–15)
ANION GAP: 7 (ref 5–15)
Anion gap: 3 — ABNORMAL LOW (ref 5–15)
Anion gap: 5 (ref 5–15)
BUN: 11 mg/dL (ref 6–20)
BUN: 11 mg/dL (ref 6–20)
BUN: 12 mg/dL (ref 6–20)
BUN: 12 mg/dL (ref 6–20)
BUN: 13 mg/dL (ref 6–20)
BUN: 14 mg/dL (ref 6–20)
CALCIUM: 8.9 mg/dL (ref 8.9–10.3)
CALCIUM: 8.9 mg/dL (ref 8.9–10.3)
CALCIUM: 8.7 mg/dL — AB (ref 8.9–10.3)
CALCIUM: 8.7 mg/dL — AB (ref 8.9–10.3)
CHLORIDE: 115 mmol/L — AB (ref 101–111)
CO2: 14 mmol/L — AB (ref 22–32)
CO2: 15 mmol/L — AB (ref 22–32)
CO2: 15 mmol/L — AB (ref 22–32)
CO2: 16 mmol/L — AB (ref 22–32)
CO2: 17 mmol/L — ABNORMAL LOW (ref 22–32)
CO2: 18 mmol/L — ABNORMAL LOW (ref 22–32)
CREATININE: 1.25 mg/dL — AB (ref 0.44–1.00)
CREATININE: 1.26 mg/dL — AB (ref 0.44–1.00)
Calcium: 8.5 mg/dL — ABNORMAL LOW (ref 8.9–10.3)
Calcium: 8.6 mg/dL — ABNORMAL LOW (ref 8.9–10.3)
Chloride: 114 mmol/L — ABNORMAL HIGH (ref 101–111)
Chloride: 113 mmol/L — ABNORMAL HIGH (ref 101–111)
Chloride: 114 mmol/L — ABNORMAL HIGH (ref 101–111)
Chloride: 114 mmol/L — ABNORMAL HIGH (ref 101–111)
Chloride: 116 mmol/L — ABNORMAL HIGH (ref 101–111)
Creatinine, Ser: 1.22 mg/dL — ABNORMAL HIGH (ref 0.44–1.00)
Creatinine, Ser: 1.3 mg/dL — ABNORMAL HIGH (ref 0.44–1.00)
Creatinine, Ser: 1.15 mg/dL — ABNORMAL HIGH (ref 0.44–1.00)
Creatinine, Ser: 1.27 mg/dL — ABNORMAL HIGH (ref 0.44–1.00)
GFR calc Af Amer: 59 mL/min — ABNORMAL LOW (ref >60)
GFR calc Af Amer: 55 mL/min — ABNORMAL LOW (ref >60)
GFR calc non Af Amer: 47 mL/min — ABNORMAL LOW (ref >60)
GFR calc non Af Amer: 49 mL/min — ABNORMAL LOW (ref >60)
GFR calc non Af Amer: 50 mL/min — ABNORMAL LOW (ref >60)
GFR calc non Af Amer: 55 mL/min — ABNORMAL LOW (ref >60)
GFR, EST AFRICAN AMERICAN: 56 mL/min — AB (ref >60)
GFR, EST AFRICAN AMERICAN: 58 mL/min — AB (ref >60)
GFR, EST AFRICAN AMERICAN: 57 mL/min — AB (ref >60)
GFR, EST NON AFRICAN AMERICAN: 51 mL/min — AB (ref >60)
GFR, EST NON AFRICAN AMERICAN: 49 mL/min — AB (ref >60)
GLUCOSE: 270 mg/dL — AB (ref 65–99)
GLUCOSE: 143 mg/dL — AB (ref 65–99)
Glucose, Bld: 105 mg/dL — ABNORMAL HIGH (ref 65–99)
Glucose, Bld: 108 mg/dL — ABNORMAL HIGH (ref 65–99)
Glucose, Bld: 171 mg/dL — ABNORMAL HIGH (ref 65–99)
Glucose, Bld: 174 mg/dL — ABNORMAL HIGH (ref 65–99)
POTASSIUM: 3.6 mmol/L (ref 3.5–5.1)
POTASSIUM: 3.8 mmol/L (ref 3.5–5.1)
POTASSIUM: 3.8 mmol/L (ref 3.5–5.1)
POTASSIUM: 4.1 mmol/L (ref 3.5–5.1)
Potassium: 4 mmol/L (ref 3.5–5.1)
Potassium: 4.6 mmol/L (ref 3.5–5.1)
SODIUM: 135 mmol/L (ref 135–145)
SODIUM: 137 mmol/L (ref 135–145)
Sodium: 134 mmol/L — ABNORMAL LOW (ref 135–145)
Sodium: 138 mmol/L (ref 135–145)
Sodium: 136 mmol/L (ref 135–145)
Sodium: 136 mmol/L (ref 135–145)

## 2016-04-13 LAB — VANCOMYCIN, TROUGH: VANCOMYCIN TR: 39 ug/mL — AB (ref 15–20)

## 2016-04-13 MED ORDER — POTASSIUM CHLORIDE 20 MEQ/15ML (10%) PO SOLN
40.0000 meq | Freq: Once | ORAL | Status: AC
  Administered 2016-04-13: 40 meq via ORAL
  Filled 2016-04-13: qty 30

## 2016-04-13 MED ORDER — POTASSIUM CHLORIDE CRYS ER 20 MEQ PO TBCR: 40.0000 meq | EXTENDED_RELEASE_TABLET | Freq: Once | ORAL | Status: DC

## 2016-04-13 MED ORDER — SODIUM CHLORIDE 0.9 % IV BOLUS (SEPSIS)
500.0000 mL | Freq: Once | INTRAVENOUS | Status: AC
  Administered 2016-04-13: 500 mL via INTRAVENOUS

## 2016-04-13 MED ORDER — HYDROCOD POLST-CPM POLST ER 10-8 MG/5ML PO SUER
5.0000 mL | Freq: Once | ORAL | Status: AC
  Administered 2016-04-13: 5 mL via ORAL
  Filled 2016-04-13: qty 5

## 2016-04-13 MED ORDER — SODIUM CHLORIDE 0.9 % IV BOLUS (SEPSIS)
1000.0000 mL | Freq: Once | INTRAVENOUS | Status: AC
  Administered 2016-04-13: 1000 mL via INTRAVENOUS

## 2016-04-13 MED ORDER — POTASSIUM CHLORIDE 10 MEQ/100ML IV SOLN
10.0000 meq | INTRAVENOUS | Status: AC
  Administered 2016-04-13 (×2): 10 meq via INTRAVENOUS
  Filled 2016-04-13 (×2): qty 100

## 2016-04-13 NOTE — Progress Notes (Signed)
PROGRESS NOTE    AYLENE [name]  ZOX:[id number] DOB: 16-Nov-1965 DOA: 04/10/2016 PCP: No primary care provider on file.  Brief Narrative: [name]  is a 50 y.o. female, With past medical history relevant for end-stage renal disease status post kidney transplant in 2011 (currently on antirejection drugs), hypertension , hypothyroidism uncontrolled diabetes with erratic blood glucose, recent admission with blood sugars over 700, however home patient has been having recurrent episodes of symptomatic hypoglycemia. She presents by EMS today after another episode of hypoglycemia. Patient is very confused and unable to give any relevant history, I called and spoke with patient's husband by phone, according to patient's husband patient was in the usual state of health on 04/09/2016 when she went to bed, around 1 AM on 04/10/2016 patient's son found her confused and very diaphoretic. Patient's husband gave over 1 L of regular Coca-Cola and a candy bar. Patient sugars were not checked, patient remained diaphoretic and confused about an hour after drinking regular Coca-Cola and eating the candy bar so patient's husband called EMS. When EMS arrived at the home patient glucose was 90, it is conceivable with the patient glucose was probably much lower prior to drinking a Coke and eating a candy bar about an hour prior. bruising/ecchymosis in lower abdominal areas noted, patient's husband denies any trauma   ED Course:- CT head, MRI brain without acute findings, UA and chest x-ray without evidence of significant infection. No fevers, no leukocytosis, patient remained confused. Urine drug screen was unremarkable   Assessment & Plan:   Principal Problem:   Altered mental status Active Problems:   DKA, type 1 (HCC)   Immunosuppression (HCC)   Sepsis (HCC)   Essential hypertension   History of renal transplant   Diabetes (HCC)   Pressure ulcer   Acute encephalopathy   Altered mental state  1-Acute  encephalopathy; fevers,  Concern for meningitis/encephalitis, patient with AMS, fever, no focal evidence of infection, on immune suppressive medications.  Continue with  Vancomycin, ampicillin , cefepime. Acyclovir day 3. Neurology consulted , defer to neuro LP.  EEG negative for seizure.  MRI negative for stroke.    2-DM; initially with concern of hypoglycemia. Develops DKA. Gap close , bicarb at 18, continue with Insulin Drip until bicarb more than 20. IV fluids.  Metabolic acidosis; added sodium bicarb..  If bicarb above 19 will proceed to transition to lantus and SSI>   3-AKI, History of renal transplant on immunosuppressive.  Panda tube was placed,  Continue with ; prednisone, Cellcept, Prograf.  IV fluids, appears dehydrated.  Renal function improving, suspect component hypovolemia, DKA.  Improving with fluids.    4-Possible sepsis; fever, tachycardia, tachypnea.  IV fluids. Lactic acid 1.1. IV antibiotics.  urine culture growing klebsiella, , blood culture; no growth. CT abdomen negative for infection.  Chest x ray netive.   5-Hypothyroidism; continue with synthroid.  6-UTI; UA growing klebsiella. On IV antibiotics.   DVT prophylaxis:  Code Status: full code.  Family Communication: none at bedside.  Disposition Plan: Remain in the step down unit.    Consultants:   Neurology   Procedures:   EEG  Antimicrobials:   Vancomycin 8-05  Cefepime 8-05  Ampicillin 8-05  Acyclovir 8/05   Subjective: Alert and oriented to place and name.  Denies pain, still confuse.  No further diarrhea.   Objective: Vitals:   04/12/16 1943 04/12/16 2259 04/13/16 0226 04/13/16 0731  BP: (!) 113/54 106/69 (!) 151/80 139/65  Pulse: 90 85 81 100  Resp: (!) 21 17 (!) 21 (!) 22  Temp: 98.5 F (36.9 C) 98.4 F (36.9 C) 98.1 F (36.7 C) 98.7 F (37.1 C)  TempSrc: Oral Oral Axillary Oral  SpO2: 96% 95% 94% 96%  Weight:      Height:        Intake/Output Summary (Last 24  hours) at 04/13/16 0754 Last data filed at 04/13/16 0735  Gross per 24 hour  Intake          8305.17 ml  Output             1950 ml  Net          6355.17 ml   Filed Weights   04/11/16 0800  Weight: 104.3 kg (230 lb)    Examination:  General exam: Appears calm and comfortable  Respiratory system: Clear to auscultation. Respiratory effort normal. Cardiovascular system: S1 & S2 heard, RRR. No JVD, murmurs, rubs, gallops or clicks. No pedal edema. Gastrointestinal system: Abdomen is nondistended, soft and nontender. No organomegaly or masses felt. Normal bowel sounds heard. Central nervous system: More alert, follows command, answer some questions. Moves extremities.  Extremities: Symmetric 5 x 5 power. Except for right upper extremity , 4/5 Skin: No rashes, lesions or ulcers     Data Reviewed: I have personally reviewed following labs and imaging studies  CBC:  Recent Labs Lab 04/10/16 0253 04/10/16 0307 04/10/16 1137 04/11/16 0503 04/12/16 0359  WBC 9.7  --  9.4 8.3 11.5*  NEUTROABS 8.0*  --   --   --   --   HGB 13.0 13.6 12.1 11.9* 11.0*  HCT 41.8 40.0 39.4 37.7 35.9*  MCV 90.1  --  89.5 91.7 90.4  PLT 232  --  216 177 153   Basic Metabolic Panel:  Recent Labs Lab 04/12/16 1143 04/12/16 1611 04/12/16 2009 04/13/16 0011 04/13/16 0421  NA 132* 137 136 135 137  K 3.6 3.5 4.8 3.8 3.8  CL 109 113* 114* 115* 114*  CO2 13* 17* 16* 17* 18*  GLUCOSE 189* 141* 223* 171* 108*  BUN CREATININE 1.47* 1.39* 1.33* 1.26* 1.22*  CALCIUM 8.3* 8.6* 8.7* 8.5* 8.9   GFR: Estimated Creatinine Clearance: 70.5 mL/min (by C-G formula based on SCr of 1.22 mg/dL). Liver Function Tests:  Recent Labs Lab 04/10/16 0253 04/11/16 0911  AST 17 13*  ALT 20 15  ALKPHOS 103 96  BILITOT 0.4 2.0*  PROT 6.4* 5.9*  ALBUMIN 2.9* 2.5*   No results for input(s): LIPASE, AMYLASE in the last 168 hours.  Recent Labs Lab 04/10/16 1137  AMMONIA 28   Coagulation  Profile:  Recent Labs Lab 04/10/16 0253 04/11/16 0911  INR 0.94 1.16   Cardiac Enzymes:  Recent Labs Lab 04/10/16 0253  TROPONINI 0.03*   BNP (last 3 results) No results for input(s): PROBNP in the last 8760 hours. HbA1C: No results for input(s): HGBA1C in the last 72 hours. CBG:  Recent Labs Lab 04/13/16 0223 04/13/16 0330 04/13/16 0436 04/13/16 0542 04/13/16 0647  GLUCAP 101* 118* 108* 110* 209*   Lipid Profile: No results for input(s): CHOL, HDL, LDLCALC, TRIG, CHOLHDL, LDLDIRECT in the last 72 hours. Thyroid Function Tests:  Recent Labs  04/10/16 1137  TSH 1.824   Anemia Panel: No results for input(s): VITAMINB12, FOLATE, FERRITIN, TIBC, IRON, RETICCTPCT in the last 72 hours. Sepsis Labs:  Recent Labs Lab 04/10/16 0308 04/11/16 0911  PROCALCITON  --  <0.10  LATICACIDVEN 1.69 1.5  Recent Results (from the past 240 hour(s))  Urine culture     Status: Abnormal   Collection Time: 04/10/16  4:30 AM  Result Value Ref Range Status   Specimen Description URINE, RANDOM  Final   Special Requests NONE  Final   Culture >=100,000 COLONIES/mL KLEBSIELLA PNEUMONIAE (A)  Final   Report Status 04/12/2016 FINAL  Final   Organism ID, Bacteria KLEBSIELLA PNEUMONIAE (A)  Final      Susceptibility   Klebsiella pneumoniae - MIC*    AMPICILLIN 16 RESISTANT Resistant     CEFAZOLIN <=4 SENSITIVE Sensitive     CEFTRIAXONE <=1 SENSITIVE Sensitive     CIPROFLOXACIN <=0.25 SENSITIVE Sensitive     GENTAMICIN <=1 SENSITIVE Sensitive     IMIPENEM <=0.25 SENSITIVE Sensitive     NITROFURANTOIN 64 INTERMEDIATE Intermediate     TRIMETH/SULFA <=20 SENSITIVE Sensitive     AMPICILLIN/SULBACTAM 4 SENSITIVE Sensitive     PIP/TAZO <=4 SENSITIVE Sensitive     Extended ESBL NEGATIVE Sensitive     * >=100,000 COLONIES/mL KLEBSIELLA PNEUMONIAE  Blood culture (routine x 2)     Status: None (Preliminary result)   Collection Time: 04/10/16  6:00 AM  Result Value Ref Range Status    Specimen Description BLOOD LEFT ARM  Final   Special Requests BOTTLES DRAWN AEROBIC AND ANAEROBIC 10CC  Final   Culture NO GROWTH 2 DAYS  Final   Report Status PENDING  Incomplete  Blood culture (routine x 2)     Status: None (Preliminary result)   Collection Time: 04/10/16  6:05 AM  Result Value Ref Range Status   Specimen Description BLOOD LEFT HAND  Final   Special Requests IN PEDIATRIC BOTTLE 4CC  Final   Culture NO GROWTH 2 DAYS  Final   Report Status PENDING  Incomplete         Radiology Studies: Dg Chest Port 1 View  Result Date: 04/12/2016 CLINICAL DATA:  Cough. EXAM: PORTABLE CHEST 1 VIEW COMPARISON:  April 11, 2016 FINDINGS: A feeding tube terminates in the stomach. The heart, hila, mediastinum, lungs, and pleura are normal. IMPRESSION: The feeding tube terminates in the stomach.  No other abnormalities. Electronically Signed   By: Gerome Sam III M.D   On: 04/12/2016 08:52   Dg Abd Portable 1v  Result Date: 04/11/2016 CLINICAL DATA:  50 year old female with history of feeding tube placement. EXAM: PORTABLE ABDOMEN - 1 VIEW COMPARISON:  Abdominal radiograph 10/23/2014. FINDINGS: Tip of feeding tube is in the proximal body of the stomach. Nonspecific bowel-gas pattern with nondilated gas-filled loops of small bowel in the left side of the abdomen, and otherwise relative paucity of bowel gas. IMPRESSION: 1. Tip of feeding tube is in the proximal body of the stomach. Electronically Signed   By: Trudie Reed M.D.   On: 04/11/2016 17:11        Scheduled Meds: . acyclovir  10 mg/kg (Adjusted) Intravenous Q8H  . ampicillin (OMNIPEN) IV  1 g Intravenous Q6H  . aspirin EC  81 mg Oral Daily  . carvedilol  25 mg Oral BID WC  . ceFEPime (MAXIPIME) IV  2 g Intravenous Q8H  . famotidine  20 mg Oral Daily  . levothyroxine  137 mcg Oral QAC breakfast  . mycophenolate  500 mg Oral BID  . nystatin cream   Topical BID  . predniSONE  5 mg Oral Q breakfast  . sodium bicarbonate   650 mg Oral TID  . sodium chloride flush  3 mL Intravenous  Q12H  . sodium chloride flush  3 mL Intravenous Q12H  . tacrolimus  1 mg Oral BID   Continuous Infusions: . sodium chloride    . sodium chloride 125 mL/hr at 04/11/16 1900  . dextrose 5 % and 0.45% NaCl 125 mL/hr at 04/12/16 1115  . insulin (NOVOLIN-R) infusion 3.7 Units/hr (04/13/16 0753)     LOS: 2 days    Time spent: 35 minutes.     Alba Coryegalado, Alonnah Lampkins A, MD [facility] Pager [phone]  If 7PM-7AM, please contact night-coverage [email or web address] Password [email or web address] 04/13/2016, 7:54 AM

## 2016-04-13 NOTE — Progress Notes (Signed)
SLP Cancellation Note  Patient Details Name: [name] MRN: [id number] DOB: 1966-04-03   Cancelled treatment:       Reason Eval/Treat Not Completed: Medical issues which prohibited therapy. Pt vomiting upon SLP arrival. Nursing present in room to assist. Will f/u as able for diet check.   Maxcine Hamaiewonsky, Graziella Connery 04/13/2016, 4:23 PM  Maxcine HamLaura Paiewonsky, M.A. CCC-SLP [phone]

## 2016-04-13 NOTE — Consult Note (Signed)
   [facility]   04/13/2016  [name] 1966/06/05 [id number]   [facility]. Went to bedside. However, she was sleeping soundly. Will come back at a later time to engage for [facility]. She is currently on the stepdown unit.  Raiford NobleAtika Eevie Lapp, MSN-Ed, RN,BSN [facility] Hospital Liaison [phone]

## 2016-04-13 NOTE — Progress Notes (Signed)
Pt was had a non productive cough that was strong, call MD and she gave one time dose of Tussionex.

## 2016-04-13 NOTE — Progress Notes (Signed)
Subjective:  [name] is resting comfortably in bed. She is awake alert and oriented. She has no focal neurological complaints. It appears that she is back to her baseline at this point.  Exam: Vitals:   04/13/16 0226 04/13/16 0731  BP: (!) 151/80 139/65  Pulse: 81 100  Resp: (!) 21 (!) 22  Temp: 98.1 F (36.7 C) 98.7 F (37.1 C)    HEENT-  Normocephalic, no lesions, without obvious abnormality.  Normal external eye and conjunctiva.  Normal TM's bilaterally.  Normal auditory canals and external ears. Normal external nose, mucus membranes and septum.  Normal pharynx. Cardiovascular- regular rate and rhythm, S1, S2 normal, no murmur, click, rub or gallop, pulses palpable throughout   Lungs- chest clear, no wheezing, rales, normal symmetric air entry, Heart exam - S1, S2 normal, no murmur, no gallop, rate regular Abdomen- soft, non-tender; bowel sounds normal; no masses,  no organomegaly Extremities- less then 2 second capillary refill Lymph-no adenopathy palpable Musculoskeletal-no joint tenderness, deformity or swelling Skin-warm and dry, no hyperpigmentation, vitiligo, or suspicious lesions    Gen: In bed, NAD MS: Awake alert oriented following commands appropriately CN: Cranial nerves II through XII are intact. Motor: Motor exam is 5 out of 5 bilaterally Sensory: Sensation is intact DTR: Reflexes are 1-2+ bilaterally  Pertinent Labs/Diagnostics: Reviewed    Impression:   [name] has been followed for altered mental status. It was felt likely related to an infectious process. She was known to have a urinary tract infection. She is also being empirically covered for the possibility of meningitis. At present she reports no headache or nuchal rigidity. She is afebrile. She presently appears back to her baseline cognitive function.   Recommendations:  1. Agree with present care. Given that [name] appears back to her baseline at this point, we will sign off. Please reconsult with  any new issues.   Kainen Struckman A. Hilda BladesArmstrong, M.D. Neurohospitalist Phone: [phone]   04/13/2016, 9:33 AM

## 2016-04-13 NOTE — Progress Notes (Signed)
Patient had a large watery stool this is fifth bowel movement in twenty four hours but first watery stool.  Contacted Dr. Sunnie Nielsenegalado updated on patients condition, received orders to retrieve a sample if patient has another watery stool.  Will continue to monitor.

## 2016-04-13 NOTE — Progress Notes (Signed)
Inpatient Diabetes Program Recommendations  AACE/ADA: New Consensus Statement on Inpatient Glycemic Control (2015)  Target Ranges:  Prepandial:   less than 140 mg/dL      Peak postprandial:   less than 180 mg/dL (1-2 hours)      Critically ill patients:  140 - 180 mg/dL   Lab Results  Component Value Date   GLUCAP 230 (H) 04/13/2016   HGBA1C 12.3 (H) 03/30/2016    Review of Glycemic Control  Results for [name] ParodyCARROLL, Jaidee S (MRN [id number]) as of 04/13/2016 09:59  Ref. Range 04/13/2016 03:30 04/13/2016 04:36 04/13/2016 05:42 04/13/2016 06:47 04/13/2016 07:29 04/13/2016 09:02  Glucose-Capillary Latest Ref Range: 65 - 99 mg/dL 409118 (H) 811108 (H) 914110 (H) 209 (H) 246 (H) 230 (H)     Diabetes history: DM2 Outpatient Diabetes medications: 70/30 insulin 20 BID, Metformin 500 QD Current orders for Inpatient glycemic control: IV insulin via Glucostabilizer  Inpatient Diabetes Program Recommendations:  Anion Gap and CO2 within normal limits but CBG remain elevated- consider transitioning to phase 2 of the DKA order set when CBG 140-180 mg/dl for 4 hours;  please ensure patient is ordered basal, bolus and mealtime insulin.   Placed consult to case mgt.to determine if Lantus or Levemir may now be covered.  Susette RacerJulie Nicolae Vasek, RN, BA, MHA, CDE Diabetes Coordinator Inpatient Diabetes Program  [phone] (Team Pager) [phone] [facility] Office) 04/13/2016 10:33 AM

## 2016-04-13 NOTE — Progress Notes (Signed)
Pharmacy Antibiotic Note  [name] is a 50 y.o. female admitted on 04/10/2016 with sepsis.  Pharmacy has been consulted for Vancomycin dosing for possible meningitis.   Vancomycin trough is elevated at 39  Plan: -Hold vancomycin  -Check random vancomycin level on 8/8 at 0500  Height: 5\' 8"  (172.7 cm) Weight: 230 lb (104.3 kg) IBW/kg (Calculated) : 63.9  Temp (24hrs), Avg:98.4 F (36.9 C), Min:98 F (36.7 C), Max:98.8 F (37.1 C)   Recent Labs Lab 04/10/16 0253  04/10/16 0308 04/10/16 1137 04/11/16 0503 04/11/16 0911  04/12/16 0359 04/12/16 0636 04/12/16 1143 04/12/16 1611 04/12/16 2009 04/13/16 0011  WBC 9.7  --   --  9.4 8.3  --   --  11.5*  --   --   --   --   --   CREATININE 1.60*  < >  --  1.30*  --  1.69*  < > 1.48* 1.56* 1.47* 1.39* 1.33* 1.26*  LATICACIDVEN  --   --  1.69  --   --  1.5  --   --   --   --   --   --   --   VANCOTROUGH  --   --   --   --   --   --   --   --   --   --   --   --  39*  < > = values in this interval not displayed.  Estimated Creatinine Clearance: 68.3 mL/min (by C-G formula based on SCr of 1.26 mg/dL).    Allergies  Allergen Reactions  . Ace Inhibitors Cough  . Adhesive [Tape] Itching    Please use paper tape  . Lisinopril Cough    [name], [name] 04/13/2016 1:01 AM

## 2016-04-14 LAB — BASIC METABOLIC PANEL
Anion gap: 4 — ABNORMAL LOW (ref 5–15)
Anion gap: 4 — ABNORMAL LOW (ref 5–15)
Anion gap: 8 (ref 5–15)
BUN: 11 mg/dL (ref 6–20)
BUN: 12 mg/dL (ref 6–20)
BUN: 14 mg/dL (ref 6–20)
CALCIUM: 8.7 mg/dL — AB (ref 8.9–10.3)
CALCIUM: 8.6 mg/dL — AB (ref 8.9–10.3)
CHLORIDE: 111 mmol/L (ref 101–111)
CO2: 14 mmol/L — AB (ref 22–32)
CO2: 16 mmol/L — ABNORMAL LOW (ref 22–32)
CO2: 18 mmol/L — AB (ref 22–32)
CREATININE: 1.24 mg/dL — AB (ref 0.44–1.00)
CREATININE: 1.26 mg/dL — AB (ref 0.44–1.00)
CREATININE: 1.15 mg/dL — AB (ref 0.44–1.00)
Calcium: 8.9 mg/dL (ref 8.9–10.3)
Chloride: 114 mmol/L — ABNORMAL HIGH (ref 101–111)
Chloride: 115 mmol/L — ABNORMAL HIGH (ref 101–111)
GFR calc non Af Amer: 50 mL/min — ABNORMAL LOW (ref >60)
GFR, EST AFRICAN AMERICAN: 58 mL/min — AB (ref >60)
GFR, EST AFRICAN AMERICAN: 57 mL/min — AB (ref >60)
GFR, EST NON AFRICAN AMERICAN: 49 mL/min — AB (ref >60)
GFR, EST NON AFRICAN AMERICAN: 55 mL/min — AB (ref >60)
GLUCOSE: 327 mg/dL — AB (ref 65–99)
Glucose, Bld: 167 mg/dL — ABNORMAL HIGH (ref 65–99)
Glucose, Bld: 172 mg/dL — ABNORMAL HIGH (ref 65–99)
Potassium: 4.3 mmol/L (ref 3.5–5.1)
Potassium: 4.4 mmol/L (ref 3.5–5.1)
Potassium: 5 mmol/L (ref 3.5–5.1)
SODIUM: 135 mmol/L (ref 135–145)
SODIUM: 136 mmol/L (ref 135–145)
Sodium: 133 mmol/L — ABNORMAL LOW (ref 135–145)

## 2016-04-14 LAB — GLUCOSE, CAPILLARY
GLUCOSE-CAPILLARY: 117 mg/dL — AB (ref 65–99)
GLUCOSE-CAPILLARY: 118 mg/dL — AB (ref 65–99)
GLUCOSE-CAPILLARY: 136 mg/dL — AB (ref 65–99)
GLUCOSE-CAPILLARY: 159 mg/dL — AB (ref 65–99)
Glucose-Capillary: 185 mg/dL — ABNORMAL HIGH (ref 65–99)
Glucose-Capillary: 204 mg/dL — ABNORMAL HIGH (ref 65–99)
Glucose-Capillary: 160 mg/dL — ABNORMAL HIGH (ref 65–99)
Glucose-Capillary: 122 mg/dL — ABNORMAL HIGH (ref 65–99)
Glucose-Capillary: 284 mg/dL — ABNORMAL HIGH (ref 65–99)
Glucose-Capillary: 259 mg/dL — ABNORMAL HIGH (ref 65–99)
Glucose-Capillary: 204 mg/dL — ABNORMAL HIGH (ref 65–99)

## 2016-04-14 MED ORDER — INSULIN ASPART 100 UNIT/ML ~~LOC~~ SOLN
0.0000 [IU] | Freq: Three times a day (TID) | SUBCUTANEOUS | Status: DC
  Administered 2016-04-14 (×2): 5 [IU] via SUBCUTANEOUS
  Administered 2016-04-15: 2 [IU] via SUBCUTANEOUS
  Administered 2016-04-16: 5 [IU] via SUBCUTANEOUS
  Administered 2016-04-16: 9 [IU] via SUBCUTANEOUS
  Administered 2016-04-16 – 2016-04-17 (×3): 5 [IU] via SUBCUTANEOUS
  Administered 2016-04-17: 7 [IU] via SUBCUTANEOUS
  Administered 2016-04-18: 1 [IU] via SUBCUTANEOUS
  Administered 2016-04-18: 2 [IU] via SUBCUTANEOUS
  Administered 2016-04-19: 1 [IU] via SUBCUTANEOUS
  Administered 2016-04-19: 5 [IU] via SUBCUTANEOUS

## 2016-04-14 MED ORDER — INSULIN GLARGINE 100 UNIT/ML ~~LOC~~ SOLN
30.0000 [IU] | Freq: Every day | SUBCUTANEOUS | Status: DC
  Filled 2016-04-14: qty 0.3

## 2016-04-14 MED ORDER — INSULIN GLARGINE 100 UNIT/ML ~~LOC~~ SOLN
10.0000 [IU] | Freq: Once | SUBCUTANEOUS | Status: AC
  Administered 2016-04-14: 10 [IU] via SUBCUTANEOUS
  Filled 2016-04-14: qty 0.1

## 2016-04-14 MED ORDER — CEFAZOLIN IN D5W 1 GM/50ML IV SOLN
1.0000 g | Freq: Three times a day (TID) | INTRAVENOUS | Status: DC
  Administered 2016-04-14 – 2016-04-15 (×3): 1 g via INTRAVENOUS
  Filled 2016-04-14 (×5): qty 50

## 2016-04-14 MED ORDER — INSULIN ASPART 100 UNIT/ML ~~LOC~~ SOLN
3.0000 [IU] | Freq: Three times a day (TID) | SUBCUTANEOUS | Status: DC
  Administered 2016-04-14 – 2016-04-17 (×9): 3 [IU] via SUBCUTANEOUS

## 2016-04-14 MED ORDER — STERILE WATER FOR INJECTION IV SOLN
INTRAVENOUS | Status: DC
  Administered 2016-04-14: 21:00:00 via INTRAVENOUS
  Filled 2016-04-14 (×4): qty 850

## 2016-04-14 MED ORDER — INSULIN GLARGINE 100 UNIT/ML ~~LOC~~ SOLN
20.0000 [IU] | Freq: Every day | SUBCUTANEOUS | Status: DC
  Administered 2016-04-14: 20 [IU] via SUBCUTANEOUS
  Filled 2016-04-14: qty 0.2

## 2016-04-14 NOTE — Progress Notes (Addendum)
PROGRESS NOTE    [name]  TGG:[id number] DOB: 1966-01-04 DOA: 04/10/2016 PCP: No primary care provider on file.  Brief Narrative: [name]  is a 50 y.o. female, With past medical history relevant for end-stage renal disease status post kidney transplant in 2011 (currently on antirejection drugs), hypertension , hypothyroidism uncontrolled diabetes with erratic blood glucose, recent admission with blood sugars over 700, however home patient has been having recurrent episodes of symptomatic hypoglycemia. She presents by EMS today after another episode of hypoglycemia. Patient is very confused and unable to give any relevant history, I called and spoke with patient's husband by phone, according to patient's husband patient was in the usual state of health on 04/09/2016 when she went to bed, around 1 AM on 04/10/2016 patient's son found her confused and very diaphoretic. Patient's husband gave over 1 L of regular Coca-Cola and a candy bar. Patient sugars were not checked, patient remained diaphoretic and confused about an hour after drinking regular Coca-Cola and eating the candy bar so patient's husband called EMS. When EMS arrived at the home patient glucose was 90, it is conceivable with the patient glucose was probably much lower prior to drinking a Coke and eating a candy bar about an hour prior. bruising/ecchymosis in lower abdominal areas noted, patient's husband denies any trauma   ED Course:- CT head, MRI brain without acute findings, UA and chest x-ray without evidence of significant infection. No fevers, no leukocytosis, patient remained confused. Urine drug screen was unremarkable  Admitted with encephalopathy, develops fever whitin 24 hours of admission. Patient continue to be lethargic, develops DKA and she was subsequently transfer to step down. DKA was treated with insulin GTT. She will be transition to long actin insulin 8-08.  Initially there was concern  for  meningitis/encephalitis, patient with AMS, fever, on immune suppressive medications. Patient was treated with  Vancomycin, ampicillin , cefepime. Acyclovir for 3 days.  Neurology consulted , discussed with neuro, patient improved, almost back to baseline no need to treat for meningitis, encephalitis. Need to observed off antibiotics. On ancef treating for UTI.    Assessment & Plan:   Principal Problem:   Altered mental status Active Problems:   DKA, type 1 ([place])   Immunosuppression ([place])   Sepsis ([place])   Essential hypertension   History of renal transplant   Diabetes ([place])   Pressure ulcer   Acute encephalopathy   Altered mental state  1-Acute encephalopathy; fevers,  Initially there was concern  for meningitis/encephalitis, patient with AMS, fever, no focal evidence of infection, on immune suppressive medications. Patient was treated with  Vancomycin, ampicillin , cefepime. Acyclovir for 3 days.  Neurology consulted , discussed with neuro, patient improved, almost back to baseline no need to treat for meningitis, encephalitis. Need to observed off antibiotics. On ancef treating for UTI.  EEG negative for seizure.  MRI negative for stroke.    2-DM; initially with concern of hypoglycemia. Develops DKA. Gap close , bicarb at 18, continue with Insulin Drip until bicarb more than 20. IV fluids.  Gap close, bicarb at 18. Will transition to lantus , SSI. Will check B -met this afternoon, if bicarb low will start Bicarb Gtt, and might need to consider renal tubular acidosis.   Addendum:  B-met this afternoon with low bicarb again at 14, but gap close at 8. She was on insulin Gtt for 3 days, at this time She might have other cause for her acidosis. I will give another dose of lantus  10 units tonight once, and increase lantus tomorrow to 30 units, I will start bicarb Gtt , repeat B-met tonight.   3-AKI, History of renal transplant on immunosuppressive.  Panda tube was placed,  Continue with ;  prednisone, Cellcept, Prograf.  IV fluids, appears dehydrated.  Renal function improving, suspect component hypovolemia, DKA.  Improving with fluids.    4-Possible sepsis; fever, tachycardia, tachypnea.  IV fluids. Lactic acid 1.1. IV antibiotics.  urine culture growing klebsiella, , blood culture; no growth. CT abdomen negative for infection.  Chest x ray netive.   5-Hypothyroidism; continue with synthroid.  6-UTI; UA growing klebsiella. On IV antibiotics.  Change antibiotics to ancef.   DVT prophylaxis:  Code Status: full code.  Family Communication: none at bedside.  Disposition Plan: Remain in the step down unit.    Consultants:   Neurology   Procedures:   EEG  Antimicrobials:   Vancomycin 8-05---8-07  Cefepime 8-05--80-08 Ancef   Ampicillin 8-05--8-07  Acyclovir 8/05--8-07   Subjective: Alert, following command, feeling better    Objective: Vitals:   04/13/16 2241 04/14/16 0314 04/14/16 0400 04/14/16 0754  BP: (!) 141/61 136/62 (!) 92/48 (!) 160/86  Pulse: 90 74 76 82  Resp: (!) _0 (!) 28  Temp: 98.2 F (36.8 C) 97.7 F (36.5 C)  97.9 F (36.6 C)  TempSrc: Oral Oral  Oral  SpO2: 98% 93% 91% 100%  Weight:      Height:        Intake/Output Summary (Last 24 hours) at 04/14/16 0827 Last data filed at 04/14/16 0756  Gross per 24 hour  Intake          9215.83 ml  Output             2550 ml  Net          6665.83 ml   Filed Weights   04/11/16 0800  Weight: 104.3 kg (230 lb)    Examination:  General exam: Appears calm and comfortable  Respiratory system: Clear to auscultation. Respiratory effort normal. Cardiovascular system: S1 & S2 heard, RRR. No JVD, murmurs, rubs, gallops or clicks. No pedal edema. Gastrointestinal system: Abdomen is nondistended, soft and nontender. No organomegaly or masses felt. Normal bowel sounds heard. Central nervous system: More alert, follows command, answer some questions. Moves extremities.  Extremities:  Symmetric 5 x 5 power. Except for right upper extremity , 4/5 Skin: No rashes, lesions or ulcers     Data Reviewed: I have personally reviewed following labs and imaging studies  CBC:  Recent Labs Lab 04/10/16 0253 04/10/16 0307 04/10/16 1137 04/11/16 0503 04/12/16 0359  WBC 9.7  --  9.4 8.3 11.5*  NEUTROABS 8.0*  --   --   --   --   HGB 13.0 13.6 12.1 11.9* 11.0*  HCT 41.8 40.0 39.4 37.7 35.9*  MCV 90.1  --  89.5 91.7 90.4  PLT 232  --  216 177 017   Basic Metabolic Panel:  Recent Labs Lab 04/13/16 1235 04/13/16 1640 04/13/16 2039 04/14/16 0006 04/14/16 0458  NA 138 136 136 135 136  K 3.6 4.0 4.6 4.3 4.4  CL 114* 114* 116* 115* 114*  CO2 16* 15* 15* 16* 18*  GLUCOSE 105* 174* 143* 167* 172*  BUN _1 CREATININE 1.15* 1.27* 1.25* 1.26* 1.15*  CALCIUM 8.9 8.7* 8.7* 8.7* 8.6*   GFR: Estimated Creatinine Clearance: 74.8 mL/min (by C-G formula based on SCr of 1.15 mg/dL). Liver Function Tests:  Recent Labs Lab 04/10/16 0253 04/11/16 0911  AST 17 13*  ALT 20 15  ALKPHOS 103 96  BILITOT 0.4 2.0*  PROT 6.4* 5.9*  ALBUMIN 2.9* 2.5*   No results for input(s): LIPASE, AMYLASE in the last 168 hours.  Recent Labs Lab 04/10/16 1137  AMMONIA 28   Coagulation Profile:  Recent Labs Lab 04/10/16 0253 04/11/16 0911  INR 0.94 1.16   Cardiac Enzymes:  Recent Labs Lab 04/10/16 0253  TROPONINI 0.03*   BNP (last 3 results) No results for input(s): PROBNP in the last 8760 hours. HbA1C: No results for input(s): HGBA1C in the last 72 hours. CBG:  Recent Labs Lab 04/14/16 0201 04/14/16 0312 04/14/16 0421 04/14/16 0525 04/14/16 0620  GLUCAP 117* 118* 136* 185* 204*   Lipid Profile: No results for input(s): CHOL, HDL, LDLCALC, TRIG, CHOLHDL, LDLDIRECT in the last 72 hours. Thyroid Function Tests: No results for input(s): TSH, T4TOTAL, FREET4, T3FREE, THYROIDAB in the last 72 hours. Anemia Panel: No results for input(s): VITAMINB12,  FOLATE, FERRITIN, TIBC, IRON, RETICCTPCT in the last 72 hours. Sepsis Labs:  Recent Labs Lab 04/10/16 0308 04/11/16 0911  PROCALCITON  --  <0.10  LATICACIDVEN 1.69 1.5    Recent Results (from the past 240 hour(s))  Urine culture     Status: Abnormal   Collection Time: 04/10/16  4:30 AM  Result Value Ref Range Status   Specimen Description URINE, RANDOM  Final   Special Requests NONE  Final   Culture >=100,000 COLONIES/mL KLEBSIELLA PNEUMONIAE (A)  Final   Report Status 04/12/2016 FINAL  Final   Organism ID, Bacteria KLEBSIELLA PNEUMONIAE (A)  Final      Susceptibility   Klebsiella pneumoniae - MIC*    AMPICILLIN 16 RESISTANT Resistant     CEFAZOLIN <=4 SENSITIVE Sensitive     CEFTRIAXONE <=1 SENSITIVE Sensitive     CIPROFLOXACIN <=0.25 SENSITIVE Sensitive     GENTAMICIN <=1 SENSITIVE Sensitive     IMIPENEM <=0.25 SENSITIVE Sensitive     NITROFURANTOIN 64 INTERMEDIATE Intermediate     TRIMETH/SULFA <=20 SENSITIVE Sensitive     AMPICILLIN/SULBACTAM 4 SENSITIVE Sensitive     PIP/TAZO <=4 SENSITIVE Sensitive     Extended ESBL NEGATIVE Sensitive     * >=100,000 COLONIES/mL KLEBSIELLA PNEUMONIAE  Blood culture (routine x 2)     Status: None (Preliminary result)   Collection Time: 04/10/16  6:00 AM  Result Value Ref Range Status   Specimen Description BLOOD LEFT ARM  Final   Special Requests BOTTLES DRAWN AEROBIC AND ANAEROBIC 10CC  Final   Culture NO GROWTH 3 DAYS  Final   Report Status PENDING  Incomplete  Blood culture (routine x 2)     Status: None (Preliminary result)   Collection Time: 04/10/16  6:05 AM  Result Value Ref Range Status   Specimen Description BLOOD LEFT HAND  Final   Special Requests IN PEDIATRIC BOTTLE 4CC  Final   Culture NO GROWTH 3 DAYS  Final   Report Status PENDING  Incomplete         Radiology Studies: Dg Chest Port 1 View  Result Date: 04/12/2016 CLINICAL DATA:  Cough. EXAM: PORTABLE CHEST 1 VIEW COMPARISON:  April 11, 2016 FINDINGS: A  feeding tube terminates in the stomach. The heart, hila, mediastinum, lungs, and pleura are normal. IMPRESSION: The feeding tube terminates in the stomach.  No other abnormalities. Electronically Signed   By: Dorise Bullion III M.D   On: 04/12/2016 08:52  Scheduled Meds: . aspirin EC  81 mg Oral Daily  . carvedilol  25 mg Oral BID WC  . famotidine  20 mg Oral Daily  . insulin glargine  20 Units Subcutaneous Daily  . levothyroxine  137 mcg Oral QAC breakfast  . mycophenolate  500 mg Oral BID  . nystatin cream   Topical BID  . predniSONE  5 mg Oral Q breakfast  . sodium chloride flush  3 mL Intravenous Q12H  . sodium chloride flush  3 mL Intravenous Q12H  . tacrolimus  1 mg Oral BID   Continuous Infusions: . sodium chloride    . sodium chloride Stopped (04/14/16 0747)  . dextrose 5 % and 0.45% NaCl 1,000 mL (04/14/16 0428)  . insulin (NOVOLIN-R) infusion 3 Units/hr (04/14/16 0747)     LOS: 3 days    Time spent: 35 minutes.     Elmarie Shiley, MD [facility] Pager [phone]  If 7PM-7AM, please contact night-coverage [email or web address] Password [facility] 04/14/2016, 8:27 AM

## 2016-04-14 NOTE — Progress Notes (Signed)
Speech Language Pathology Treatment: Dysphagia  Patient Details Name: [name] MRN: [id number] DOB: 01-12-1966 Today's Date: 04/14/2016 Time: [id number] SLP Time Calculation (min) (ACUTE ONLY): 11 min  Assessment / Plan / Recommendation Clinical Impression  SLP provided skilled observation during breakfast meal, with regular consistency tray (SLP recommendations had been for Dys 2). Pt has prolonged mastication with solids, and pt voices her preference for softer textures. Baseline cough present before PO intake persists throughout meal, but does not appear to increase in frequency and vocal quality remains clear. Per chart review, breath sounds are unchanged and she remains afebrile. Continue to recommend Dys 2 diet and thin liquids; order changed to reflect this. Will continue to follow briefly.   HPI HPI: Pt is a 50 y.o. female with PMH of ESRD, headache, HTN, immunosuppression, and type II DM admitted 8/4 with AMS onset PTA, initially admitted for hypoglycemia. MRI/ EEG negative, CXR clear. Pt continues to be encephalopathic, reoprtedly not following commands, answering a few questions this a.m., has Cortrak. Bedside swallow eval ordered to assess swallowing/ recommend safest diet.      SLP Plan  Continue with current plan of care     Recommendations  Diet recommendations: Dysphagia 2 (fine chop);Thin liquid Liquids provided via: Cup;Straw Medication Administration: Crushed with puree Supervision: Patient able to self feed;Intermittent supervision to cue for compensatory strategies Compensations: Slow rate;Small sips/bites;Minimize environmental distractions Postural Changes and/or Swallow Maneuvers: Seated upright 90 degrees;Upright 30-60 min after meal             Oral Care Recommendations: Oral care BID Follow up Recommendations: 24 hour supervision/assistance Plan: Continue with current plan of care     GO                [name], [name] 04/14/2016, 9:30  AM  [name], M.A. CCC-SLP [phone]

## 2016-04-14 NOTE — Progress Notes (Signed)
Inpatient Diabetes Program Recommendations  AACE/ADA: New Consensus Statement on Inpatient Glycemic Control (2015)  Target Ranges:  Prepandial:   less than 140 mg/dL      Peak postprandial:   less than 180 mg/dL (1-2 hours)      Critically ill patients:  140 - 180 mg/dL   Results for [name], [name] (MRN [id number]) as of 04/14/2016 08:29  Ref. Range 04/13/2016 17:59 04/13/2016 19:03 04/13/2016 20:13 04/13/2016 21:14 04/13/2016 22:39 04/13/2016 23:49 04/14/2016 00:53 04/14/2016 02:01 04/14/2016 03:12 04/14/2016 04:21 04/14/2016 05:25 04/14/2016 06:20  Glucose-Capillary Latest Ref Range: 65 - 99 mg/dL 409151 (H) 811162 (H) 914129 (H) 148 (H) 193 (H) 163 (H) 159 (H) 117 (H) 118 (H) 136 (H) 185 (H) 204 (H)   Review of Glycemic Control  Outpatient Diabetes medications: 70/30 20 units BID, Metformin 500 mg daily Current orders for Inpatient glycemic control: Lantus 20 units daily, Novolin R insulin drip  Inpatient Diabetes Program Recommendations: Insulin - Basal: Note patient has been ordered Lantus 20 units daily this morning. Patient has received a total of 21.5 units of insulin per insulin drip over the past 8 hours. Recommend changing Lantus to 30 units daily (based on 104 kg x 0.3 units). Also please continue insulin drip for 2 hours after Lantus given as the onset of Lantus is almost 2 hours. Correction (SSI): Please consider ordering Novolog 0-15 units Q4H at time of transition off IV insulin drip. Insulin - Meal Coverage: If patient is eating at least 50% of meals, please consider ordering Novolog 3 units TID with meals for meal coverage. A1C: A1C 12.3% on 03/30/16 indicating an average glucose of 306 mg/dl over the past 2-3 months. Patient will need outpatient DM medications modified to improve glycemic control and needs to follow up with PCP.  Thanks, Orlando PennerMarie Terel Bann, RN, MSN, CDE Diabetes Coordinator Inpatient Diabetes Program [phone] (Team Pager from 8am to 5pm) [phone] ([facility] office) [phone] [facility]  office) [phone] [facility] office)

## 2016-04-14 NOTE — Progress Notes (Addendum)
Pharmacy Antibiotic Note  [name] is a 50 y.o. female admitted on 04/10/2016 with UTI.  Pharmacy is consulted for cefazolin dosing.   Plan: Dc vanc and acyclovir and cefepime Change to cefazolin 1g IV q8h Monitor clinical progress, c/s, renal function, abx plan/LOT   Height: 5\' 8"  (172.7 cm) Weight: 230 lb (104.3 kg) IBW/kg (Calculated) : 63.9  Temp (24hrs), Avg:98.1 F (36.7 C), Min:97.7 F (36.5 C), Max:98.7 F (37.1 C)   Recent Labs Lab 04/10/16 0253  04/10/16 0308 04/10/16 1137 04/11/16 0503 04/11/16 0911  04/12/16 0359  04/13/16 0011  04/13/16 1235 04/13/16 1640 04/13/16 2039 04/14/16 0006 04/14/16 0458  WBC 9.7  --   --  9.4 8.3  --   --  11.5*  --   --   --   --   --   --   --   --   CREATININE 1.60*  < >  --  1.30*  --  1.69*  < > 1.48*  < > 1.26*  < > 1.15* 1.27* 1.25* 1.26* 1.15*  LATICACIDVEN  --   --  1.69  --   --  1.5  --   --   --   --   --   --   --   --   --   --   VANCOTROUGH  --   --   --   --   --   --   --   --   --  5639*  --   --   --   --   --   --   < > = values in this interval not displayed.  Estimated Creatinine Clearance: 74.8 mL/min (by C-G formula based on SCr of 1.15 mg/dL).    Allergies  Allergen Reactions  . Ace Inhibitors Cough  . Adhesive [Tape] Itching    Please use paper tape  . Lisinopril Cough   8/5 vanc>>8/7 8/5 cefepime>>8/8 8/5 acyclovir>>8/7 8/5 ampicillin>>8/7 8/8 cefazolin>>  8/4 blood>> ngtd 8/4 urine>> klebsiella pneumo resistant to amp   Thank you for allowing us to participate in this patients care. Signe Coltonya C Perry Brucato, PharmD Pager: [phone] 04/14/2016 7:30 AM

## 2016-04-15 ENCOUNTER — Inpatient Hospital Stay (HOSPITAL_COMMUNITY): Payer: Medicare Other

## 2016-04-15 DIAGNOSIS — G9340 Encephalopathy, unspecified: Secondary | ICD-10-CM

## 2016-04-15 DIAGNOSIS — E1010 Type 1 diabetes mellitus with ketoacidosis without coma: Principal | ICD-10-CM

## 2016-04-15 DIAGNOSIS — N390 Urinary tract infection, site not specified: Secondary | ICD-10-CM

## 2016-04-15 DIAGNOSIS — Z940 Kidney transplant status: Secondary | ICD-10-CM

## 2016-04-15 DIAGNOSIS — D899 Disorder involving the immune mechanism, unspecified: Secondary | ICD-10-CM

## 2016-04-15 DIAGNOSIS — E872 Acidosis: Secondary | ICD-10-CM

## 2016-04-15 LAB — CBC
HEMATOCRIT: 32.2 % — AB (ref 36.0–46.0)
Hemoglobin: 10.2 g/dL — ABNORMAL LOW (ref 12.0–15.0)
MCH: 28.4 pg (ref 26.0–34.0)
MCHC: 31.7 g/dL (ref 30.0–36.0)
MCV: 89.7 fL (ref 78.0–100.0)
PLATELETS: 98 10*3/uL — AB (ref 150–400)
RBC: 3.59 MIL/uL — AB (ref 3.87–5.11)
RDW: 14.2 % (ref 11.5–15.5)
WBC: 3.6 10*3/uL — AB (ref 4.0–10.5)

## 2016-04-15 LAB — BASIC METABOLIC PANEL
ANION GAP: 8 (ref 5–15)
ANION GAP: 8 (ref 5–15)
BUN: 14 mg/dL (ref 6–20)
BUN: 13 mg/dL (ref 6–20)
CALCIUM: 8.9 mg/dL (ref 8.9–10.3)
CALCIUM: 8.9 mg/dL (ref 8.9–10.3)
CO2: 17 mmol/L — ABNORMAL LOW (ref 22–32)
CO2: 21 mmol/L — ABNORMAL LOW (ref 22–32)
Chloride: 110 mmol/L (ref 101–111)
Chloride: 107 mmol/L (ref 101–111)
Creatinine, Ser: 1.25 mg/dL — ABNORMAL HIGH (ref 0.44–1.00)
Creatinine, Ser: 1.09 mg/dL — ABNORMAL HIGH (ref 0.44–1.00)
GFR, EST AFRICAN AMERICAN: 58 mL/min — AB (ref >60)
GFR, EST NON AFRICAN AMERICAN: 50 mL/min — AB (ref >60)
GFR, EST NON AFRICAN AMERICAN: 59 mL/min — AB (ref >60)
Glucose, Bld: 184 mg/dL — ABNORMAL HIGH (ref 65–99)
Glucose, Bld: 75 mg/dL (ref 65–99)
POTASSIUM: 4.8 mmol/L (ref 3.5–5.1)
POTASSIUM: 4.1 mmol/L (ref 3.5–5.1)
Sodium: 135 mmol/L (ref 135–145)
Sodium: 136 mmol/L (ref 135–145)

## 2016-04-15 LAB — GLUCOSE, CAPILLARY
GLUCOSE-CAPILLARY: 66 mg/dL (ref 65–99)
GLUCOSE-CAPILLARY: 159 mg/dL — AB (ref 65–99)
Glucose-Capillary: 64 mg/dL — ABNORMAL LOW (ref 65–99)
Glucose-Capillary: 139 mg/dL — ABNORMAL HIGH (ref 65–99)
Glucose-Capillary: 109 mg/dL — ABNORMAL HIGH (ref 65–99)
Glucose-Capillary: 150 mg/dL — ABNORMAL HIGH (ref 65–99)

## 2016-04-15 LAB — CULTURE, BLOOD (ROUTINE X 2)
CULTURE: NO GROWTH
Culture: NO GROWTH

## 2016-04-15 MED ORDER — AMOXICILLIN-POT CLAVULANATE 875-125 MG PO TABS
1.0000 | ORAL_TABLET | Freq: Two times a day (BID) | ORAL | Status: DC
  Administered 2016-04-15 – 2016-04-17 (×5): 1 via ORAL
  Filled 2016-04-15 (×6): qty 1

## 2016-04-15 MED ORDER — INSULIN GLARGINE 100 UNIT/ML ~~LOC~~ SOLN
20.0000 [IU] | Freq: Every day | SUBCUTANEOUS | Status: DC
  Administered 2016-04-15 – 2016-04-17 (×3): 20 [IU] via SUBCUTANEOUS
  Filled 2016-04-15 (×3): qty 0.2

## 2016-04-15 MED ORDER — SODIUM BICARBONATE 650 MG PO TABS
650.0000 mg | ORAL_TABLET | Freq: Three times a day (TID) | ORAL | Status: DC
  Administered 2016-04-15 – 2016-04-17 (×7): 650 mg via ORAL
  Filled 2016-04-15 (×7): qty 1

## 2016-04-15 NOTE — Progress Notes (Signed)
PROGRESS NOTE    [name]  UXL:[id number] DOB: 1966/01/02 DOA: 04/10/2016 PCP: No primary care provider on file.  Brief Narrative: [name]  is a 50 y.o. female, With past medical history relevant for end-stage renal disease status post kidney transplant in 2011 (currently on antirejection drugs), hypertension , hypothyroidism uncontrolled diabetes with erratic blood glucose, recent admission with blood sugars over 700, however home patient has been having recurrent episodes of symptomatic hypoglycemia. She presents by EMS today after another episode of hypoglycemia. Patient is very confused and unable to give any relevant history, I called and spoke with patient's husband by phone, according to patient's husband patient was in the usual state of health on 04/09/2016 when she went to bed, around 1 AM on 04/10/2016 patient's son found her confused and very diaphoretic. Patient's husband gave over 1 L of regular Coca-Cola and a candy bar. Patient sugars were not checked, patient remained diaphoretic and confused about an hour after drinking regular Coca-Cola and eating the candy bar so patient's husband called EMS. When EMS arrived at the home patient glucose was 90, it is conceivable with the patient glucose was probably much lower prior to drinking a Coke and eating a candy bar about an hour prior. bruising/ecchymosis in lower abdominal areas noted, patient's husband denies any trauma   ED Course:- CT head, MRI brain without acute findings, UA and chest x-ray without evidence of significant infection. No fevers, no leukocytosis, patient remained confused. Urine drug screen was unremarkable  Admitted with encephalopathy, develops fever whitin 24 hours of admission. Patient continue to be lethargic, develops DKA and she was subsequently transfer to step down. DKA was treated with insulin GTT. She will be transition to long actin insulin 8-08.  Initially there was concern  for  meningitis/encephalitis, patient with AMS, fever, on immune suppressive medications. Patient was treated with  Vancomycin, ampicillin , cefepime. Acyclovir for 3 days.  Neurology consulted , discussed with neuro, patient improved, almost back to baseline no need to treat for meningitis, encephalitis. Need to observed off antibiotics. On ancef treating for UTI.    Assessment & Plan:   Principal Problem:   Altered mental status Active Problems:   DKA, type 1 (HCC)   Immunosuppression (HCC)   Sepsis (HCC)   Essential hypertension   History of renal transplant   Diabetes (HCC)   Pressure ulcer   Acute encephalopathy   Altered mental state  1-Acute encephalopathy; fevers, possible from uti , metabolic encephalopathy with possible sepsis on presentation with fever, tachycardia. tachypnea Initially there was concern  for meningitis/encephalitis, patient with AMS, fever, no focal evidence of infection, on immune suppressive medications. Patient was treated with  Vancomycin, ampicillin , cefepime. Acyclovir for 3 days.  Neurology consulted , discussed with neuro, patient improved, almost back to baseline no need to treat for meningitis, encephalitis. Need to observed off antibiotics. On ancef treating for UTI.  EEG negative for seizure.  MRI negative for stroke.    2-insulin dependent DM; initially with concern of hypoglycemia. Develops DKA. Gap close , bicarb at 18, continue with Insulin Drip until bicarb more than 20. IV fluids.  Gap close, bicarb at 18. transitioned to lantus , SSI.    3-AKI, History of renal transplant on immunosuppressive.  Panda tube was placed,  Continue with ; prednisone, Cellcept, Prograf.  IV fluids, appears dehydrated.  Renal function improving, suspect component hypovolemia, DKA.  Improving with fluids.    4-Possible sepsis; fever, tachycardia, tachypnea.  IV fluids.  Lactic acid 1.1. IV antibiotics.  urine culture growing klebsiella, , blood culture; no  growth. CT abdomen negative for infection.  Chest x ray netive.   5-Hypothyroidism; continue with synthroid.   6-UTI; UA growing klebsiella. On IV antibiotics.  Received vanc/cefepime initially then ancef. Now on oral augmentin  7. Metabolic acidosis: likely mutifactorial including renal loss and gi loss ( she has diarrhea two days ago), better on bicarb drip, change to oral bicarb supplement  Cough: speech recommended dysphagia II diet with thin liquid, cxr pending  DVT prophylaxis:  Code Status: full code.  Family Communication: talked to husband michael over the phone at length  Disposition Plan: out of stepdown to med tele, anticipate d/c in 24-48hrs   Consultants:   Neurology   Procedures:   EEG  Antimicrobials:   Vancomycin 8-05---8-07  Cefepime 8-05--80-08 Ancef   Ampicillin 8-05--8-07  Acyclovir 8/05--8-07   Subjective: C/o headache, intermittent dry cough noticed, husband report patient has been coughing since Monday,  RN report cough possible related to eating Blood sugar on the low side this am Speech clear, oriented but poor memory, reported baseline   Objective: Vitals:   04/14/16 2315 04/15/16 0305 04/15/16 0306 04/15/16 0732  BP: (!) 153/77 (!) 151/94 (!) 151/94 (!) 147/72  Pulse: 78 77 82 91  Resp: (!) 21 16 16 13   Temp: 97.8 F (36.6 C)  97.7 F (36.5 C) 98 F (36.7 C)  TempSrc: Oral  Oral Oral  SpO2: 98% 94% 97% 98%  Weight:      Height:        Intake/Output Summary (Last 24 hours) at 04/15/16 0821 Last data filed at 04/15/16 [id number]  Gross per 24 hour  Intake          1284.17 ml  Output             1525 ml  Net          -240.83 ml   Filed Weights   04/11/16 0800  Weight: 104.3 kg (230 lb)    Examination:  General exam: Appears calm and comfortable  Respiratory system: Clear to auscultation. Respiratory effort normal. Cardiovascular system: S1 & S2 heard, RRR. No JVD, murmurs, rubs, gallops or clicks. No pedal  edema. Gastrointestinal system: Abdomen is nondistended, soft and nontender. No organomegaly or masses felt. Normal bowel sounds heard. Central nervous system: More alert, follows command, answer some questions. Moves extremities.  Extremities: Symmetric 5 x 5 power. Except for right upper extremity , 4/5 Skin: No rashes, lesions or ulcers     Data Reviewed: I have personally reviewed following labs and imaging studies  CBC:  Recent Labs Lab 04/10/16 0253 04/10/16 0307 04/10/16 1137 04/11/16 0503 04/12/16 0359 04/15/16 0439  WBC 9.7  --  9.4 8.3 11.5* 3.6*  NEUTROABS 8.0*  --   --   --   --   --   HGB 13.0 13.6 12.1 11.9* 11.0* 10.2*  HCT 41.8 40.0 39.4 37.7 35.9* 32.2*  MCV 90.1  --  89.5 91.7 90.4 89.7  PLT 232  --  216 177 153 98*   Basic Metabolic Panel:  Recent Labs Lab 04/14/16 0006 04/14/16 0458 04/14/16 1427 04/14/16 2213 04/15/16 0439  NA 135 136 133* 135 136  K 4.3 4.4 5.0 4.8 4.1  CL 115* 114* 111 110 107  CO2 16* 18* 14* 17* 21*  GLUCOSE 167* 172* 327* 184* 75  BUN 11 12 14 14 13   CREATININE 1.26* 1.15* 1.24* 1.25* 1.09*  CALCIUM 8.7* 8.6* 8.9 8.9 8.9   GFR: Estimated Creatinine Clearance: 78.9 mL/min (by C-G formula based on SCr of 1.09 mg/dL). Liver Function Tests:  Recent Labs Lab 04/10/16 0253 04/11/16 0911  AST 17 13*  ALT 20 15  ALKPHOS 103 96  BILITOT 0.4 2.0*  PROT 6.4* 5.9*  ALBUMIN 2.9* 2.5*   No results for input(s): LIPASE, AMYLASE in the last 168 hours.  Recent Labs Lab 04/10/16 1137  AMMONIA 28   Coagulation Profile:  Recent Labs Lab 04/10/16 0253 04/11/16 0911  INR 0.94 1.16   Cardiac Enzymes:  Recent Labs Lab 04/10/16 0253  TROPONINI 0.03*   BNP (last 3 results) No results for input(s): PROBNP in the last 8760 hours. HbA1C: No results for input(s): HGBA1C in the last 72 hours. CBG:  Recent Labs Lab 04/14/16 0857 04/14/16 1100 04/14/16 1652 04/14/16 2140 04/15/16 0710  GLUCAP 122* 284* 259* 204*  66   Lipid Profile: No results for input(s): CHOL, HDL, LDLCALC, TRIG, CHOLHDL, LDLDIRECT in the last 72 hours. Thyroid Function Tests: No results for input(s): TSH, T4TOTAL, FREET4, T3FREE, THYROIDAB in the last 72 hours. Anemia Panel: No results for input(s): VITAMINB12, FOLATE, FERRITIN, TIBC, IRON, RETICCTPCT in the last 72 hours. Sepsis Labs:  Recent Labs Lab 04/10/16 0308 04/11/16 0911  PROCALCITON  --  <0.10  LATICACIDVEN 1.69 1.5    Recent Results (from the past 240 hour(s))  Urine culture     Status: Abnormal   Collection Time: 04/10/16  4:30 AM  Result Value Ref Range Status   Specimen Description URINE, RANDOM  Final   Special Requests NONE  Final   Culture >=100,000 COLONIES/mL KLEBSIELLA PNEUMONIAE (A)  Final   Report Status 04/12/2016 FINAL  Final   Organism ID, Bacteria KLEBSIELLA PNEUMONIAE (A)  Final      Susceptibility   Klebsiella pneumoniae - MIC*    AMPICILLIN 16 RESISTANT Resistant     CEFAZOLIN <=4 SENSITIVE Sensitive     CEFTRIAXONE <=1 SENSITIVE Sensitive     CIPROFLOXACIN <=0.25 SENSITIVE Sensitive     GENTAMICIN <=1 SENSITIVE Sensitive     IMIPENEM <=0.25 SENSITIVE Sensitive     NITROFURANTOIN 64 INTERMEDIATE Intermediate     TRIMETH/SULFA <=20 SENSITIVE Sensitive     AMPICILLIN/SULBACTAM 4 SENSITIVE Sensitive     PIP/TAZO <=4 SENSITIVE Sensitive     Extended ESBL NEGATIVE Sensitive     * >=100,000 COLONIES/mL KLEBSIELLA PNEUMONIAE  Blood culture (routine x 2)     Status: None (Preliminary result)   Collection Time: 04/10/16  6:00 AM  Result Value Ref Range Status   Specimen Description BLOOD LEFT ARM  Final   Special Requests BOTTLES DRAWN AEROBIC AND ANAEROBIC 10CC  Final   Culture NO GROWTH 4 DAYS  Final   Report Status PENDING  Incomplete  Blood culture (routine x 2)     Status: None (Preliminary result)   Collection Time: 04/10/16  6:05 AM  Result Value Ref Range Status   Specimen Description BLOOD LEFT HAND  Final   Special Requests  IN PEDIATRIC BOTTLE 4CC  Final   Culture NO GROWTH 4 DAYS  Final   Report Status PENDING  Incomplete         Radiology Studies: No results found.      Scheduled Meds: . amoxicillin-clavulanate  1 tablet Oral Q12H  . aspirin EC  81 mg Oral Daily  . carvedilol  25 mg Oral BID WC  . famotidine  20 mg Oral Daily  .  insulin aspart  0-9 Units Subcutaneous TID WC  . insulin aspart  3 Units Subcutaneous TID WC  . insulin glargine  20 Units Subcutaneous Daily  . levothyroxine  137 mcg Oral QAC breakfast  . mycophenolate  500 mg Oral BID  . nystatin cream   Topical BID  . predniSONE  5 mg Oral Q breakfast  . sodium bicarbonate  650 mg Oral TID  . sodium chloride flush  3 mL Intravenous Q12H  . sodium chloride flush  3 mL Intravenous Q12H  . tacrolimus  1 mg Oral BID   Continuous Infusions:     LOS: 4 days    Time spent: 35 minutes.     Albertine Grates, MD PhD [facility] Pager [phone]  If 7PM-7AM, please contact night-coverage [email or web address] Password [email or web address] 04/15/2016, 8:21 AM

## 2016-04-15 NOTE — Care Management Note (Addendum)
Case Management Note  Patient Details  Name: [name] MRN: [id number] Date of Birth: Nov 24, 1965  Subjective/Objective:   NCM spoke with patient she states she has been taking her insulin, but she has to wait to get her disability check.  [facility] 70[facility]  NCM tried to do benefit check but CMA could not locate RX coverage.  Spouse states patient [facility] [address] and she gets all her meds for her liver [facility] [phone].   [facility] they state patient does not have part D. Medicare part B pays for her transplant meds and then they have a charge account for her.                  Action/Plan:   Expected Discharge Date:                  Expected Discharge Plan:  Home w Home Health Services  In-House Referral:     Discharge planning Services  CM Consult  Post Acute Care Choice:    Choice offered to:     DME Arranged:    DME Agency:     HH Arranged:    HH Agency:     Status of Service:  In process, will continue to follow  If discussed at Long Length of Stay Meetings, dates discussed:    Additional Comments:  Leone Havenaylor, Mattea Seger Clinton, RN 04/15/2016, 12:39 PM

## 2016-04-15 NOTE — Progress Notes (Signed)
Patient trasfered from 3S to [phone] via wheelchair; alert and oriented x 3; no complaints of pain; IV saline locked in LFA. Orient patient to room and unit; instructed how to use the call bell and  fall risk precautions. Will continue to monitor the patient.

## 2016-04-15 NOTE — Progress Notes (Signed)
Speech Language Pathology Treatment: Dysphagia  Patient Details Name: CAMILAH SPILLMAN MRN: [id number] DOB: 08/20/1966 Today's Date: 04/15/2016 Time: 6190-1222 SLP Time Calculation (min) (ACUTE ONLY): 9 min  Assessment / Plan / Recommendation Clinical Impression  Pt does not have baseline coughing today as she has had on previous SLP visits, allowing for better assessment of PO intake. Throughout consumption of thin liquids by straw and bites of puree, she has no overt indicators of aspiration. She is pleased with her current Dys 2 diet to facilitate mastication given lack of upper dentition. No further acute SLP needs identified; will sign off.   HPI HPI: Pt is a 50 y.o. female with PMH of ESRD, headache, HTN, immunosuppression, and type II DM admitted 8/4 with AMS onset PTA, initially admitted for hypoglycemia. MRI/ EEG negative, CXR clear. Pt continues to be encephalopathic, reoprtedly not following commands, answering a few questions this a.m., has Cortrak. Bedside swallow eval ordered to assess swallowing/ recommend safest diet.      SLP Plan  All goals met     Recommendations  Diet recommendations: Dysphagia 2 (fine chop);Thin liquid Liquids provided via: Cup;Straw Medication Administration: Crushed with puree Supervision: Patient able to self feed;Intermittent supervision to cue for compensatory strategies Compensations: Slow rate;Small sips/bites;Minimize environmental distractions Postural Changes and/or Swallow Maneuvers: Seated upright 90 degrees;Upright 30-60 min after meal             Oral Care Recommendations: Oral care BID Follow up Recommendations: 24 hour supervision/assistance Plan: All goals met     GO                Germain Osgood 04/15/2016, 1:27 PM  Germain Osgood, M.A. CCC-SLP [phone]

## 2016-04-16 DIAGNOSIS — E1310 Other specified diabetes mellitus with ketoacidosis without coma: Secondary | ICD-10-CM

## 2016-04-16 DIAGNOSIS — I10 Essential (primary) hypertension: Secondary | ICD-10-CM

## 2016-04-16 DIAGNOSIS — Z794 Long term (current) use of insulin: Secondary | ICD-10-CM

## 2016-04-16 LAB — COMPREHENSIVE METABOLIC PANEL
ALBUMIN: 2 g/dL — AB (ref 3.5–5.0)
ALT: 12 U/L — ABNORMAL LOW (ref 14–54)
AST: 11 U/L — AB (ref 15–41)
Alkaline Phosphatase: 90 U/L (ref 38–126)
Anion gap: 9 (ref 5–15)
BILIRUBIN TOTAL: 0.8 mg/dL (ref 0.3–1.2)
BUN: 13 mg/dL (ref 6–20)
CHLORIDE: 104 mmol/L (ref 101–111)
CO2: 24 mmol/L (ref 22–32)
Calcium: 8.9 mg/dL (ref 8.9–10.3)
Creatinine, Ser: 1.11 mg/dL — ABNORMAL HIGH (ref 0.44–1.00)
GFR calc Af Amer: >60 mL/min (ref >60)
GFR calc non Af Amer: 57 mL/min — ABNORMAL LOW (ref >60)
GLUCOSE: 224 mg/dL — AB (ref 65–99)
POTASSIUM: 4.2 mmol/L (ref 3.5–5.1)
Sodium: 137 mmol/L (ref 135–145)
TOTAL PROTEIN: 5.2 g/dL — AB (ref 6.5–8.1)

## 2016-04-16 LAB — CBC
HEMATOCRIT: 31.6 % — AB (ref 36.0–46.0)
Hemoglobin: 9.9 g/dL — ABNORMAL LOW (ref 12.0–15.0)
MCH: 27.3 pg (ref 26.0–34.0)
MCHC: 31.3 g/dL (ref 30.0–36.0)
MCV: 87.1 fL (ref 78.0–100.0)
Platelets: 118 10*3/uL — ABNORMAL LOW (ref 150–400)
RBC: 3.63 MIL/uL — ABNORMAL LOW (ref 3.87–5.11)
RDW: 13.9 % (ref 11.5–15.5)
WBC: 3.3 10*3/uL — AB (ref 4.0–10.5)

## 2016-04-16 LAB — GLUCOSE, CAPILLARY
GLUCOSE-CAPILLARY: 353 mg/dL — AB (ref 65–99)
GLUCOSE-CAPILLARY: 380 mg/dL — AB (ref 65–99)
GLUCOSE-CAPILLARY: 173 mg/dL — AB (ref 65–99)
Glucose-Capillary: 261 mg/dL — ABNORMAL HIGH (ref 65–99)

## 2016-04-16 LAB — MAGNESIUM: Magnesium: 1.1 mg/dL — ABNORMAL LOW (ref 1.7–2.4)

## 2016-04-16 MED ORDER — MAGNESIUM SULFATE 4 GM/100ML IV SOLN
4.0000 g | Freq: Once | INTRAVENOUS | Status: AC
  Administered 2016-04-16: 4 g via INTRAVENOUS
  Filled 2016-04-16: qty 100

## 2016-04-16 MED ORDER — FUROSEMIDE 10 MG/ML IJ SOLN
40.0000 mg | Freq: Every day | INTRAMUSCULAR | Status: DC
  Administered 2016-04-16 – 2016-04-19 (×4): 40 mg via INTRAVENOUS
  Filled 2016-04-16 (×4): qty 4

## 2016-04-16 MED ORDER — INSULIN GLARGINE 100 UNIT/ML ~~LOC~~ SOLN
5.0000 [IU] | Freq: Every day | SUBCUTANEOUS | Status: DC
  Administered 2016-04-16 – 2016-04-17 (×2): 5 [IU] via SUBCUTANEOUS
  Filled 2016-04-16 (×3): qty 0.05

## 2016-04-16 MED ORDER — GUAIFENESIN-DM 100-10 MG/5ML PO SYRP
5.0000 mL | ORAL_SOLUTION | ORAL | Status: DC | PRN
  Administered 2016-04-16 – 2016-04-18 (×3): 5 mL via ORAL
  Filled 2016-04-16 (×3): qty 5

## 2016-04-16 NOTE — Care Management Important Message (Signed)
Important Message  Patient Details  Name: [name] MRN: [id number] Date of Birth: [birth year]   Medicare Important Message Given:  Yes    Lawerance Sabalebbie Monque Haggar, RN 04/16/2016, 11:50 AM

## 2016-04-16 NOTE — Progress Notes (Signed)
Inpatient Diabetes Program Recommendations  AACE/ADA: New Consensus Statement on Inpatient Glycemic Control (2015)  Target Ranges:  Prepandial:   less than 140 mg/dL      Peak postprandial:   less than 180 mg/dL (1-2 hours)      Critically ill patients:  140 - 180 mg/dL  Results for Exie ParodyCARROLL, Idaliz S (MRN [id number]) as of 04/16/2016 14:20  Ref. Range 04/15/2016 07:10 04/15/2016 07:21 04/15/2016 10:05 04/15/2016 11:05 04/15/2016 16:09 04/15/2016 22:08 04/16/2016 08:06 04/16/2016 12:26  Glucose-Capillary Latest Ref Range: 65 - 99 mg/dL 66 64 (L) 409139 (H) 811159 (H) 109 (H) 150 (H) 261 (H) 353 (H)    Review of Glycemic Control  Outpatient Diabetes medications: 70/30 20 units BID, Metformin 500 mg daily Current orders for Inpatient glycemic control: Lantus 20 units daily, Novolog 0-9 units TID with meals, Novolog 3 units TID with meals for meal coverage  Inpatient Diabetes Program Recommendations:  Insulin - Basal: In reviewing the chart, noted patient received a total of Lantus 30 units on 04/14/16. Fasting glucose was 64 mg/dl on 9/1/478/9/17 so Lantus was decreased and patient received Lantus 20 units on 04/15/16.  Fasting glucose 261 mg/dl today and glcuose 829353 mg/dl before lunch. Please consider increasing Lantus to 25 units daily. If Lantus is increased as recommended please also order one time Lantus 5 units x 1 now so patient gets a total of 25 units today. Insulin-Correction: Please consider ordering Novolog bedtime correction scale. Insulin-Meal Coverage: Please consider increasing meal coverage to Novolog 5 units TID with meals.  Thanks, Orlando PennerMarie Shakthi Scipio, RN, MSN, CDE Diabetes Coordinator Inpatient Diabetes Program [phone] (Team Pager from 8am to 5pm) [phone] ([facility] office) [phone] [facility] office) [phone] [facility] office)

## 2016-04-16 NOTE — Progress Notes (Signed)
PROGRESS NOTE    [name]  NGE:[id number] DOB: 05/25/1966 DOA: 04/10/2016 PCP: No primary care provider on file.  Brief Narrative: [name]  is a 50 y.o. female, With past medical history relevant for end-stage renal disease status post kidney transplant in 2011 (currently on antirejection drugs), hypertension , hypothyroidism uncontrolled diabetes with erratic blood glucose, recent admission with blood sugars over 700, however home patient has been having recurrent episodes of symptomatic hypoglycemia. She presents by EMS today after another episode of hypoglycemia. Patient is very confused and unable to give any relevant history, I called and spoke with patient's husband by phone, according to patient's husband patient was in the usual state of health on 04/09/2016 when she went to bed, around 1 AM on 04/10/2016 patient's son found her confused and very diaphoretic. Patient's husband gave over 1 L of regular Coca-Cola and a candy bar. Patient sugars were not checked, patient remained diaphoretic and confused about an hour after drinking regular Coca-Cola and eating the candy bar so patient's husband called EMS. When EMS arrived at the home patient glucose was 90, it is conceivable with the patient glucose was probably much lower prior to drinking a Coke and eating a candy bar about an hour prior. bruising/ecchymosis in lower abdominal areas noted, patient's husband denies any trauma   ED Course:- CT head, MRI brain without acute findings, UA and chest x-ray without evidence of significant infection. No fevers, no leukocytosis, patient remained confused. Urine drug screen was unremarkable  Admitted with encephalopathy, develops fever whitin 24 hours of admission. Patient continue to be lethargic, develops DKA and she was subsequently transfer to step down. DKA was treated with insulin GTT. She will be transition to long actin insulin 8-08.  Initially there was concern  for  meningitis/encephalitis, patient with AMS, fever, on immune suppressive medications. Patient was treated with  Vancomycin, ampicillin , cefepime. Acyclovir for 3 days.  Neurology consulted , discussed with neuro, patient improved, almost back to baseline no need to treat for meningitis, encephalitis. Need to observed off antibiotics. On ancef treating for UTI.    Assessment & Plan:   Principal Problem:   Altered mental status Active Problems:   DKA, type 1 (HCC)   Immunosuppression (HCC)   Sepsis (HCC)   Essential hypertension   History of renal transplant   Diabetes (HCC)   Pressure ulcer   Acute encephalopathy   Altered mental state  1-Acute encephalopathy; fevers, possible from uti , metabolic encephalopathy with possible sepsis on presentation with fever, tachycardia. tachypnea Initially there was concern  for meningitis/encephalitis, patient with AMS, fever, no focal evidence of infection, on immune suppressive medications. Patient was treated with  Vancomycin, ampicillin , cefepime. Acyclovir for 3 days.  Neurology consulted , discussed with neuro, patient improved, almost back to baseline no need to treat for meningitis, encephalitis. Need to observed off antibiotics. On ancef treating for UTI.  EEG negative for seizure.  MRI negative for stroke.    2-insulin dependent DM; initially with concern of hypoglycemia. Develops DKA. Gap close , bicarb at 18, continue with Insulin Drip until bicarb more than 20. IV fluids.  Gap close, bicarb at 18. transitioned to lantus , SSI.    3-AKI, History of renal transplant on immunosuppressive.  Panda tube was placed,  Continue with ; prednisone, Cellcept, Prograf.  Renal function improving, suspect component hypovolemia, DKA.  Improving with fluids. ivf stopped on 8/8   4-Possible sepsis; fever, tachycardia, tachypnea.  IV fluids. Lactic acid  1.1. IV antibiotics.  urine culture growing klebsiella, , blood culture; no growth. CT abdomen  negative for infection.  Chest x ray netive.   5-Hypothyroidism; continue with synthroid.   6-UTI; UA growing klebsiella. On IV antibiotics.  Received vanc/cefepime initially then ancef. Now on oral augmentin  7. Metabolic acidosis: likely mutifactorial including renal loss and gi loss ( she has diarrhea two days ago), better on bicarb drip, change to oral bicarb supplement  Cough: speech recommended dysphagia II diet with thin liquid, cxr with mild bronchitis changes  Bilateral lower extremity edema: likely from fluids overload, will get venous US and echo cardiogram, trial of lasix  Hypomagnesemia: replace mag  DVT prophylaxis:  Code Status: full code.  Family Communication: talked to husband michael over the phone at length  Disposition Plan: likely home with home health on 8/11, pending pt eveal   Consultants:   Neurology   Procedures:   EEG  Antimicrobials:   Vancomycin 8-05---8-07  Cefepime 8-05--80-08 Ancef   Ampicillin 8-05--8-07  Acyclovir 8/05--8-07   Subjective: C/o knee pain Speech clear, oriented but poor memory, reported baseline   Objective: Vitals:   04/15/16 1604 04/15/16 2205 04/16/16 0227 04/16/16 0533  BP: (!) 161/85 (!) 146/75 (!) 175/78 (!) 144/68  Pulse: 82 80 77 83  Resp: 16 16 18 16   Temp:  97.7 F (36.5 C) 97.9 F (36.6 C) 98.1 F (36.7 C)  TempSrc:  Oral Oral Oral  SpO2: 97% 100% 99% 98%  Weight:      Height:        Intake/Output Summary (Last 24 hours) at 04/16/16 1214 Last data filed at 04/15/16 2153  Gross per 24 hour  Intake              483 ml  Output              650 ml  Net             -167 ml   Filed Weights   04/11/16 0800  Weight: 104.3 kg (230 lb)    Examination:  General exam: Appears calm and comfortable  Respiratory system: Clear to auscultation. Respiratory effort normal. Cardiovascular system: S1 & S2 heard, RRR. No JVD, murmurs, rubs, gallops or clicks. No pedal edema. Gastrointestinal system:  Abdomen is nondistended, soft and nontender. No organomegaly or masses felt. Normal bowel sounds heard. Central nervous system: More alert, follows command, answer some questions. Moves extremities.  Extremities: Symmetric 5 x 5 power. Except for right upper extremity , 4/5. Pitting edema bilateral lower extremities Skin: No rashes, lesions or ulcers     Data Reviewed: I have personally reviewed following labs and imaging studies  CBC:  Recent Labs Lab 04/10/16 0253  04/10/16 1137 04/11/16 0503 04/12/16 0359 04/15/16 0439 04/16/16 0552  WBC 9.7  --  9.4 8.3 11.5* 3.6* 3.3*  NEUTROABS 8.0*  --   --   --   --   --   --   HGB 13.0  < > 12.1 11.9* 11.0* 10.2* 9.9*  HCT 41.8  < > 39.4 37.7 35.9* 32.2* 31.6*  MCV 90.1  --  89.5 91.7 90.4 89.7 87.1  PLT 232  --  216 177 153 98* 118*  < > = values in this interval not displayed. Basic Metabolic Panel:  Recent Labs Lab 04/14/16 0458 04/14/16 1427 04/14/16 2213 04/15/16 0439 04/16/16 0552  NA 136 133* 135 136 137  K 4.4 5.0 4.8 4.1 4.2  CL 114* 111 110  107 104  CO2 18* 14* 17* 21* 24  GLUCOSE 172* 327* 184* 75 224*  BUN CREATININE 1.15* 1.24* 1.25* 1.09* 1.11*  CALCIUM 8.6* 8.9 8.9 8.9 8.9  MG  --   --   --   --  1.1*   GFR: Estimated Creatinine Clearance: 77.5 mL/min (by C-G formula based on SCr of 1.11 mg/dL). Liver Function Tests:  Recent Labs Lab 04/10/16 0253 04/11/16 0911 04/16/16 0552  AST 17 13* 11*  ALT 20 15 12*  ALKPHOS 103 96 90  BILITOT 0.4 2.0* 0.8  PROT 6.4* 5.9* 5.2*  ALBUMIN 2.9* 2.5* 2.0*   No results for input(s): LIPASE, AMYLASE in the last 168 hours.  Recent Labs Lab 04/10/16 1137  AMMONIA 28   Coagulation Profile:  Recent Labs Lab 04/10/16 0253 04/11/16 0911  INR 0.94 1.16   Cardiac Enzymes:  Recent Labs Lab 04/10/16 0253  TROPONINI 0.03*   BNP (last 3 results) No results for input(s): PROBNP in the last 8760 hours. HbA1C: No results for input(s):  HGBA1C in the last 72 hours. CBG:  Recent Labs Lab 04/15/16 1005 04/15/16 1105 04/15/16 1609 04/15/16 2208 04/16/16 0806  GLUCAP 139* 159* 109* 150* 261*   Lipid Profile: No results for input(s): CHOL, HDL, LDLCALC, TRIG, CHOLHDL, LDLDIRECT in the last 72 hours. Thyroid Function Tests: No results for input(s): TSH, T4TOTAL, FREET4, T3FREE, THYROIDAB in the last 72 hours. Anemia Panel: No results for input(s): VITAMINB12, FOLATE, FERRITIN, TIBC, IRON, RETICCTPCT in the last 72 hours. Sepsis Labs:  Recent Labs Lab 04/10/16 0308 04/11/16 0911  PROCALCITON  --  <0.10  LATICACIDVEN 1.69 1.5    Recent Results (from the past 240 hour(s))  Urine culture     Status: Abnormal   Collection Time: 04/10/16  4:30 AM  Result Value Ref Range Status   Specimen Description URINE, RANDOM  Final   Special Requests NONE  Final   Culture >=100,000 COLONIES/mL KLEBSIELLA PNEUMONIAE (A)  Final   Report Status 04/12/2016 FINAL  Final   Organism ID, Bacteria KLEBSIELLA PNEUMONIAE (A)  Final      Susceptibility   Klebsiella pneumoniae - MIC*    AMPICILLIN 16 RESISTANT Resistant     CEFAZOLIN <=4 SENSITIVE Sensitive     CEFTRIAXONE <=1 SENSITIVE Sensitive     CIPROFLOXACIN <=0.25 SENSITIVE Sensitive     GENTAMICIN <=1 SENSITIVE Sensitive     IMIPENEM <=0.25 SENSITIVE Sensitive     NITROFURANTOIN 64 INTERMEDIATE Intermediate     TRIMETH/SULFA <=20 SENSITIVE Sensitive     AMPICILLIN/SULBACTAM 4 SENSITIVE Sensitive     PIP/TAZO <=4 SENSITIVE Sensitive     Extended ESBL NEGATIVE Sensitive     * >=100,000 COLONIES/mL KLEBSIELLA PNEUMONIAE  Blood culture (routine x 2)     Status: None   Collection Time: 04/10/16  6:00 AM  Result Value Ref Range Status   Specimen Description BLOOD LEFT ARM  Final   Special Requests BOTTLES DRAWN AEROBIC AND ANAEROBIC 10CC  Final   Culture NO GROWTH 5 DAYS  Final   Report Status 04/15/2016 FINAL  Final  Blood culture (routine x 2)     Status: None   Collection  Time: 04/10/16  6:05 AM  Result Value Ref Range Status   Specimen Description BLOOD LEFT HAND  Final   Special Requests IN PEDIATRIC BOTTLE 4CC  Final   Culture NO GROWTH 5 DAYS  Final   Report Status 04/15/2016 FINAL  Final  Radiology Studies: Dg Chest 1 View  Result Date: 04/15/2016 CLINICAL DATA:  Cough EXAM: CHEST 1 VIEW COMPARISON:  04/12/2016 FINDINGS: Cardiomediastinal silhouette is stable. No acute infiltrate or pleural effusion. No pulmonary edema. Mild perihilar bronchitic changes. IMPRESSION: No infiltrate or pulmonary edema. Mild perihilar bronchitic changes. Electronically Signed   By: Natasha Mead M.D.   On: 04/15/2016 09:45        Scheduled Meds: . amoxicillin-clavulanate  1 tablet Oral Q12H  . aspirin EC  81 mg Oral Daily  . carvedilol  25 mg Oral BID WC  . famotidine  20 mg Oral Daily  . insulin aspart  0-9 Units Subcutaneous TID WC  . insulin aspart  3 Units Subcutaneous TID WC  . insulin glargine  20 Units Subcutaneous Daily  . levothyroxine  137 mcg Oral QAC breakfast  . magnesium sulfate 1 - 4 g bolus IVPB  4 g Intravenous Once  . mycophenolate  500 mg Oral BID  . nystatin cream   Topical BID  . predniSONE  5 mg Oral Q breakfast  . sodium bicarbonate  650 mg Oral TID  . sodium chloride flush  3 mL Intravenous Q12H  . sodium chloride flush  3 mL Intravenous Q12H  . tacrolimus  1 mg Oral BID   Continuous Infusions:     LOS: 5 days    Time spent: 35 minutes.     Albertine Grates, MD PhD [facility] Pager [phone]  If 7PM-7AM, please contact night-coverage [email or web address] Password [email or web address] 04/16/2016, 12:14 PM

## 2016-04-16 NOTE — Progress Notes (Signed)
Brief Nutrition Follow-Up Note  Chart reviewed; pt continues to be encephalopathic. Pt had cortrak tube placed on 04/11/16, but removed on 04/12/16. Pt has been advanced to a dysphagia 2 diet, followed by SLP. Meal completion 50-100%.   Pt remains inappropriate for education due to encephalopathy.   RD will sign off, but please reconsult RD if further nutritional issues arise.   Nykerria Macconnell A. Mayford KnifeWilliams, RD, LDN, CDE Pager: [phone] After hours Pager: [phone]

## 2016-04-16 NOTE — Progress Notes (Signed)
Physical Therapy Evaluation Patient Details Name: [name] MRN: [id number] DOB: March 21, 1966 Today's Date: 04/16/2016   History of Present Illness  pt is a 50 y/o female with h/o ESRD with kidney transplant in 2011, HTN, "brittle" diabetes, admitted by EMS with hypoglycemia and AMS.  Possible sepsis due to UTI.  Clinical Impression  Pt admitted with/for AMS.  Pt currently limited functionally due to the problems listed below.  (see problems list.)  Pt will benefit from PT to maximize function and safety to be able to get home safely with available assist of family.     Follow Up Recommendations Home health PT    Equipment Recommendations  Rolling walker with 5" wheels    Recommendations for Other Services       Precautions / Restrictions Precautions Precautions: Fall      Mobility  Bed Mobility Overal bed mobility: Modified Independent                Transfers Overall transfer level: Needs assistance Equipment used: None Transfers: Sit to/from Stand Sit to Stand: Min guard         General transfer comment: guard for safety due to unsteadiness  Ambulation/Gait Ambulation/Gait assistance: Min assist Ambulation Distance (Feet): 175 Feet Assistive device: 1 person hand held assist Gait Pattern/deviations: Step-through pattern;Scissoring;Drifts right/left Gait velocity: decr Gait velocity interpretation: Below normal speed for age/gender General Gait Details: generally unsteady with several episodes of LOB with stagger or scissoring to "catch" balance  Stairs            Wheelchair Mobility    Modified Rankin (Stroke Patients Only)       Balance Overall balance assessment: Needs assistance   Sitting balance-Leahy Scale: Good       Standing balance-Leahy Scale: Fair (poor dynamically)                               Pertinent Vitals/Pain Pain Assessment: Faces Faces Pain Scale: Hurts little more Pain Location: knees Pain  Descriptors / Indicators: Sore Pain Intervention(s): Monitored during session    Home Living Family/patient expects to be discharged to:: Private residence Living Arrangements: Spouse/significant other;Children Available Help at Discharge: Family;Available PRN/intermittently 8(50 y/o off from school) Type of Home: House Home Access: Stairs to enter Entrance Stairs-Rails: Right;Can reach Technical sales engineerboth;Left Entrance Stairs-Number of Steps: 4 Home Layout: One level Home Equipment: Shower seat      Prior Function Level of Independence: Independent               Hand Dominance   Dominant Hand: Right    Extremity/Trunk Assessment   Upper Extremity Assessment: Generalized weakness;Overall [facility] for tasks assessed (grossly 4/5)           Lower Extremity Assessment: Overall WFL for tasks assessed (proximal weakness at 4-/5 o/w >4/5)         Communication   Communication: No difficulties  Cognition Arousal/Alertness: Awake/alert Behavior During Therapy: WFL for tasks assessed/performed Overall Cognitive Status: Impaired/Different from baseline Area of Impairment: Attention;Safety/judgement;Awareness;Problem solving   Current Attention Level: Sustained     Safety/Judgement: Decreased awareness of deficits Awareness: Emergent Problem Solving: Slow processing      General Comments      Exercises        Assessment/Plan    PT Assessment Patient needs continued PT services  PT Diagnosis Difficulty walking   PT Problem List Decreased strength;Decreased activity tolerance;Decreased balance;Decreased mobility;Decreased knowledge of use of DME;Pain  PT Treatment Interventions DME instruction;Gait training;Stair training;Functional mobility training;Therapeutic activities;Patient/family education;Neuromuscular re-education   PT Goals (Current goals can be found in the Care Plan section) Acute Rehab PT Goals Patient Stated Goal: Regain IND and return home PT Goal Formulation:  With patient Time For Goal Achievement: 04/11/16 Potential to Achieve Goals: Fair    Frequency Min 3X/week   Barriers to discharge        Co-evaluation               End of Session   Activity Tolerance: Patient tolerated treatment well Patient left: in bed;Other (comment);with call bell/phone within reach (sitting EOB) Nurse Communication: Mobility status         Time: 1610-9604 PT Time Calculation (min) (ACUTE ONLY): 21 min   Charges:   PT Evaluation $PT Eval Moderate Complexity: 1 Procedure     PT G Codes:        Grace Haggart, Eliseo Gum 04/16/2016, 12:51 PM  04/16/2016  [place] Bing, PT [phone]  (pager)

## 2016-04-17 ENCOUNTER — Inpatient Hospital Stay (HOSPITAL_COMMUNITY): Payer: Medicare Other

## 2016-04-17 DIAGNOSIS — I509 Heart failure, unspecified: Secondary | ICD-10-CM

## 2016-04-17 DIAGNOSIS — E0810 Diabetes mellitus due to underlying condition with ketoacidosis without coma: Secondary | ICD-10-CM

## 2016-04-17 DIAGNOSIS — G9340 Encephalopathy, unspecified: Secondary | ICD-10-CM

## 2016-04-17 LAB — CBC
HCT: 31.3 % — ABNORMAL LOW (ref 36.0–46.0)
HEMOGLOBIN: 9.8 g/dL — AB (ref 12.0–15.0)
MCH: 27.8 pg (ref 26.0–34.0)
MCHC: 31.3 g/dL (ref 30.0–36.0)
MCV: 88.9 fL (ref 78.0–100.0)
PLATELETS: 138 10*3/uL — AB (ref 150–400)
RBC: 3.52 MIL/uL — AB (ref 3.87–5.11)
RDW: 14 % (ref 11.5–15.5)
WBC: 2.7 10*3/uL — ABNORMAL LOW (ref 4.0–10.5)

## 2016-04-17 LAB — BASIC METABOLIC PANEL
Anion gap: 9 (ref 5–15)
BUN: 15 mg/dL (ref 6–20)
CHLORIDE: 101 mmol/L (ref 101–111)
CO2: 26 mmol/L (ref 22–32)
Calcium: 9 mg/dL (ref 8.9–10.3)
Creatinine, Ser: 1.09 mg/dL — ABNORMAL HIGH (ref 0.44–1.00)
GFR calc Af Amer: >60 mL/min (ref >60)
GFR calc non Af Amer: 59 mL/min — ABNORMAL LOW (ref >60)
GLUCOSE: 203 mg/dL — AB (ref 65–99)
POTASSIUM: 4.4 mmol/L (ref 3.5–5.1)
Sodium: 136 mmol/L (ref 135–145)

## 2016-04-17 LAB — MAGNESIUM: Magnesium: 1.9 mg/dL (ref 1.7–2.4)

## 2016-04-17 LAB — GLUCOSE, CAPILLARY
GLUCOSE-CAPILLARY: 256 mg/dL — AB (ref 65–99)
GLUCOSE-CAPILLARY: 168 mg/dL — AB (ref 65–99)
Glucose-Capillary: 303 mg/dL — ABNORMAL HIGH (ref 65–99)
Glucose-Capillary: 186 mg/dL — ABNORMAL HIGH (ref 65–99)

## 2016-04-17 LAB — ECHOCARDIOGRAM COMPLETE
Height: 68 in
WEIGHTICAEL: 3680 [oz_av]

## 2016-04-17 MED ORDER — SODIUM BICARBONATE 650 MG PO TABS
650.0000 mg | ORAL_TABLET | Freq: Every day | ORAL | Status: DC
  Administered 2016-04-18 – 2016-04-19 (×2): 650 mg via ORAL
  Filled 2016-04-17 (×3): qty 1

## 2016-04-17 MED ORDER — INSULIN GLARGINE 100 UNIT/ML ~~LOC~~ SOLN
30.0000 [IU] | Freq: Every day | SUBCUTANEOUS | Status: DC
  Administered 2016-04-18: 30 [IU] via SUBCUTANEOUS
  Filled 2016-04-17: qty 0.3

## 2016-04-17 MED ORDER — INSULIN NPH ISOPHANE & REGULAR (70-30) 100 UNIT/ML ~~LOC~~ SUSP: 22.0000 [IU] | Freq: Two times a day (BID) | SUBCUTANEOUS | 0 refills | Status: DC

## 2016-04-17 MED ORDER — INSULIN ASPART 100 UNIT/ML ~~LOC~~ SOLN
5.0000 [IU] | Freq: Three times a day (TID) | SUBCUTANEOUS | Status: DC
  Administered 2016-04-18 – 2016-04-19 (×4): 5 [IU] via SUBCUTANEOUS

## 2016-04-17 NOTE — Progress Notes (Signed)
Inpatient Diabetes Program Recommendations  AACE/ADA: New Consensus Statement on Inpatient Glycemic Control (2015)  Target Ranges:  Prepandial:   less than 140 mg/dL      Peak postprandial:   less than 180 mg/dL (1-2 hours)      Critically ill patients:  140 - 180 mg/dL   Results for [name], [name] (MRN [id number]) as of 04/17/2016 09:42  Ref. Range 04/16/2016 08:06 04/16/2016 12:26 04/16/2016 16:31 04/16/2016 22:02  Glucose-Capillary Latest Ref Range: 65 - 99 mg/dL 784261 (H) 696353 (H) 295380 (H) 173 (H)   Results for [name], [name] (MRN [id number]) as of 04/17/2016 09:42  Ref. Range 04/17/2016 07:29  Glucose-Capillary Latest Ref Range: 65 - 99 mg/dL 102256 (H)    Home DM Meds: 70/30 Insulin- 18 units bidwc       Metformin 500 mg daily  Current Insulin Orders: Lantus 20 units QAM/ 5 units QHS      Novolog Sensitive Correction Scale/ SSI (0-9 units) TID AC         Novolog 3 units tidwc      MD- Please consider the following in-hospital insulin adjustments:  1. Increase Lantus to 30 units daily (stop Lantus 5 units QHS)  2. Increase Novolog Meal Coverage to: Novolog 5 units tidwc     --Will follow patient during hospitalization--  Ambrose FinlandJeannine Johnston Sahalie Beth RN, MSN, CDE Diabetes Coordinator Inpatient Glycemic Control Team Team Pager: [phone] (8a-5p)

## 2016-04-17 NOTE — Progress Notes (Signed)
  Echocardiogram 2D Echocardiogram has been performed.  [name], [name] 04/17/2016, 3:29 PM

## 2016-04-17 NOTE — Progress Notes (Signed)
**  Preliminary report by tech**  Bilateral lower extremity venous duplex completed. There is no evidence of deep or superficial vein thrombosis involving the right and left lower extremities. All visualized vessels appear patent and compressible. There is no evidence of Baker's cysts bilaterally.  04/17/16 3:43 PM Olen CordialGreg Bronislaus Verdell RVT

## 2016-04-17 NOTE — Consult Note (Signed)
   [facility]   04/17/2016  [name] [birth year] [id number]   [facility]. Spoke with [name] at bedside. She was alert and oriented x3 during bedside conversation. Asked if she would like writer to contact husband to discuss. She states her husband works at [employer] and he is currently unavailable. [facility]ained [facility] and she pleasantly declined. Left brochure and contact information for her to share with Mr. [name] and for them to call if agreeable to [facility]. Made inpatient RNCM aware that Mrs. [name] Pickarroll [facility].  Raiford NobleAtika Adriyana Greenbaum, MSN-Ed, RN,BSN [facility] Hospital Liaison [phone]

## 2016-04-17 NOTE — Progress Notes (Signed)
PROGRESS NOTE    [name]  ZOX:[id number] DOB: [birth year] DOA: 04/10/2016 PCP: No primary care provider on file.  Brief Narrative: [name]  is a 50 y.o. female, With past medical history relevant for end-stage renal disease status post kidney transplant in 2011 (currently on antirejection drugs), hypertension , hypothyroidism uncontrolled diabetes with erratic blood glucose, recent admission with blood sugars over 700, however home patient has been having recurrent episodes of symptomatic hypoglycemia. She presents by EMS today after another episode of hypoglycemia. Patient is very confused and unable to give any relevant history, I called and spoke with patient's husband by phone, according to patient's husband patient was in the usual state of health on 04/09/2016 when she went to bed, around 1 AM on 04/10/2016 patient's son found her confused and very diaphoretic. Patient's husband gave over 1 L of regular Coca-Cola and a candy bar. Patient sugars were not checked, patient remained diaphoretic and confused about an hour after drinking regular Coca-Cola and eating the candy bar so patient's husband called EMS. When EMS arrived at the home patient glucose was 90, it is conceivable with the patient glucose was probably much lower prior to drinking a Coke and eating a candy bar about an hour prior. bruising/ecchymosis in lower abdominal areas noted, patient's husband denies any trauma   ED Course:- CT head, MRI brain without acute findings, UA and chest x-ray without evidence of significant infection. No fevers, no leukocytosis, patient remained confused. Urine drug screen was unremarkable  Admitted with encephalopathy, develops fever whitin 24 hours of admission. Patient continue to be lethargic, develops DKA and she was subsequently transfer to step down. DKA was treated with insulin GTT. She will be transition to long actin insulin 8-08.  Initially there was concern  for  meningitis/encephalitis, patient with AMS, fever, on immune suppressive medications. Patient was treated with  Vancomycin, ampicillin , cefepime. Acyclovir for 3 days.  Neurology consulted , discussed with neuro, patient improved, almost back to baseline no need to treat for meningitis, encephalitis. Need to observed off antibiotics. On ancef treating for UTI.    Assessment & Plan:   Principal Problem:   Altered mental status Active Problems:   DKA, type 1 (HCC)   Immunosuppression (HCC)   Sepsis (HCC)   Essential hypertension   History of renal transplant   Diabetes (HCC)   Pressure ulcer   Acute encephalopathy   Altered mental state  1-Acute encephalopathy; fevers, possible from uti , metabolic encephalopathy with possible sepsis on presentation with fever, tachycardia. tachypnea Initially there was concern  for meningitis/encephalitis, patient with AMS, fever, no focal evidence of infection, on immune suppressive medications. Patient was treated with  Vancomycin, ampicillin , cefepime. Acyclovir for 3 days.  Neurology consulted , discussed with neuro, patient improved, almost back to baseline no need to treat for meningitis, encephalitis. Need to observed off antibiotics. On abx treating for UTI.  EEG negative for seizure.  MRI negative for stroke.    2-insulin dependent DM; initially with concern of hypoglycemia. Develops DKA.  Gap close, bicarb at 18. transitioned to lantus , SSI.  a1c 12.3, husband report patient blood sugar has been running between 250 to 400 at home. They prefer to be discharged home on insulin 70/30, she has been on 18unit bid, patient and husband agreed to increase to 22units bid, and continue ssi   3-AKI, History of renal transplant on immunosuppressive.  Panda tube was placed,  Continue with ; prednisone, Cellcept, Prograf.  Renal  function improving, suspect component hypovolemia, DKA.  Improving with fluids. ivf stopped on 8/8   4-Possible sepsis;  fever, tachycardia, tachypnea.  IV fluids. Lactic acid 1.1. IV antibiotics.  urine culture growing klebsiella, , blood culture; no growth. CT abdomen negative for infection.  Chest x ray netive.   5-Hypothyroidism; continue with synthroid.   6-UTI; UA growing klebsiella. On IV antibiotics.  Received vanc/cefepime initially then ancef then oral augmentin, finished abx treatment in the hospital  7. Metabolic acidosis: likely mutifactorial including renal loss and gi loss ( she has diarrhea two days ago), better on bicarb drip, change to oral bicarb supplement  Cough: speech recommended dysphagia II diet with thin liquid, cxr with mild bronchitis changes  Bilateral lower extremity edema: likely from fluids overload,  venous US no DVT and echo cardiogram with grade II diastolic dysfunction, trial of lasix, will plan to discharge home on lasix and d/c home med norvasc  Hypomagnesemia: replace mag  DVT prophylaxis:  Code Status: full code.  Family Communication: talked to husband michael over the phone at length on 8/9 and 8/11 Disposition Plan: likely home with home health on 8/12,   Consultants:   Neurology   Procedures:   EEG  Antimicrobials:   Vancomycin 8-05---8-07  Cefepime 8-05--80-08 Ancef   Ampicillin 8-05--8-07  Acyclovir 8/05--8-07   Subjective: C/o bilateral lower extremity edema, blood sugar elevated, otherwise feeling better Speech clear, oriented but poor memory, reported baseline   Objective: Vitals:   04/16/16 1503 04/16/16 2025 04/17/16 0019 04/17/16 0359  BP: (!) 151/63 (!) 141/71 (!) 144/64 (!) 139/94  Pulse: 81 75 84 86  Resp: 16 18 18 18   Temp: 98.3 F (36.8 C) 98.3 F (36.8 C) 98.1 F (36.7 C) 97.7 F (36.5 C)  TempSrc: Oral Oral Oral Oral  SpO2: 95% 92% 95% 92%  Weight:      Height:        Intake/Output Summary (Last 24 hours) at 04/17/16 1748 Last data filed at 04/17/16 1300  Gross per 24 hour  Intake              513 ml  Output              1025 ml  Net             -512 ml   Filed Weights   04/11/16 0800  Weight: 104.3 kg (230 lb)    Examination:  General exam: Appears calm and comfortable  Respiratory system: Clear to auscultation. Respiratory effort normal. Cardiovascular system: S1 & S2 heard, RRR. No JVD, murmurs, rubs, gallops or clicks. No pedal edema. Gastrointestinal system: Abdomen is nondistended, soft and nontender. No organomegaly or masses felt. Normal bowel sounds heard. Central nervous system: More alert, follows command, answer some questions. Moves extremities.  Extremities: Symmetric 5 x 5 power. Except for right upper extremity , 4/5. Pitting edema bilateral lower extremities Skin: No rashes, lesions or ulcers     Data Reviewed: I have personally reviewed following labs and imaging studies  CBC:  Recent Labs Lab 04/11/16 0503 04/12/16 0359 04/15/16 0439 04/16/16 0552 04/17/16 0346  WBC 8.3 11.5* 3.6* 3.3* 2.7*  HGB 11.9* 11.0* 10.2* 9.9* 9.8*  HCT 37.7 35.9* 32.2* 31.6* 31.3*  MCV 91.7 90.4 89.7 87.1 88.9  PLT 177 153 98* 118* 138*   Basic Metabolic Panel:  Recent Labs Lab 04/14/16 1427 04/14/16 2213 04/15/16 0439 04/16/16 0552 04/17/16 0346  NA 133* 135 136 137 136  K 5.0 4.8 4.1  4.2 4.4  CL 111 110 107 104 101  CO2 14* 17* 21* 24 26  GLUCOSE 327* 184* 75 224* 203*  BUN CREATININE 1.24* 1.25* 1.09* 1.11* 1.09*  CALCIUM 8.9 8.9 8.9 8.9 9.0  MG  --   --   --  1.1* 1.9   GFR: Estimated Creatinine Clearance: 78.9 mL/min (by C-G formula based on SCr of 1.09 mg/dL). Liver Function Tests:  Recent Labs Lab 04/11/16 0911 04/16/16 0552  AST 13* 11*  ALT 15 12*  ALKPHOS 96 90  BILITOT 2.0* 0.8  PROT 5.9* 5.2*  ALBUMIN 2.5* 2.0*   No results for input(s): LIPASE, AMYLASE in the last 168 hours. No results for input(s): AMMONIA in the last 168 hours. Coagulation Profile:  Recent Labs Lab 04/11/16 0911  INR 1.16   Cardiac Enzymes: No  results for input(s): CKTOTAL, CKMB, CKMBINDEX, TROPONINI in the last 168 hours. BNP (last 3 results) No results for input(s): PROBNP in the last 8760 hours. HbA1C: No results for input(s): HGBA1C in the last 72 hours. CBG:  Recent Labs Lab 04/16/16 1631 04/16/16 2202 04/17/16 0729 04/17/16 1234 04/17/16 1700  GLUCAP 380* 173* 256* 168* 303*   Lipid Profile: No results for input(s): CHOL, HDL, LDLCALC, TRIG, CHOLHDL, LDLDIRECT in the last 72 hours. Thyroid Function Tests: No results for input(s): TSH, T4TOTAL, FREET4, T3FREE, THYROIDAB in the last 72 hours. Anemia Panel: No results for input(s): VITAMINB12, FOLATE, FERRITIN, TIBC, IRON, RETICCTPCT in the last 72 hours. Sepsis Labs:  Recent Labs Lab 04/11/16 0911  PROCALCITON <0.10  LATICACIDVEN 1.5    Recent Results (from the past 240 hour(s))  Urine culture     Status: Abnormal   Collection Time: 04/10/16  4:30 AM  Result Value Ref Range Status   Specimen Description URINE, RANDOM  Final   Special Requests NONE  Final   Culture >=100,000 COLONIES/mL KLEBSIELLA PNEUMONIAE (A)  Final   Report Status 04/12/2016 FINAL  Final   Organism ID, Bacteria KLEBSIELLA PNEUMONIAE (A)  Final      Susceptibility   Klebsiella pneumoniae - MIC*    AMPICILLIN 16 RESISTANT Resistant     CEFAZOLIN <=4 SENSITIVE Sensitive     CEFTRIAXONE <=1 SENSITIVE Sensitive     CIPROFLOXACIN <=0.25 SENSITIVE Sensitive     GENTAMICIN <=1 SENSITIVE Sensitive     IMIPENEM <=0.25 SENSITIVE Sensitive     NITROFURANTOIN 64 INTERMEDIATE Intermediate     TRIMETH/SULFA <=20 SENSITIVE Sensitive     AMPICILLIN/SULBACTAM 4 SENSITIVE Sensitive     PIP/TAZO <=4 SENSITIVE Sensitive     Extended ESBL NEGATIVE Sensitive     * >=100,000 COLONIES/mL KLEBSIELLA PNEUMONIAE  Blood culture (routine x 2)     Status: None   Collection Time: 04/10/16  6:00 AM  Result Value Ref Range Status   Specimen Description BLOOD LEFT ARM  Final   Special Requests BOTTLES DRAWN  AEROBIC AND ANAEROBIC 10CC  Final   Culture NO GROWTH 5 DAYS  Final   Report Status 04/15/2016 FINAL  Final  Blood culture (routine x 2)     Status: None   Collection Time: 04/10/16  6:05 AM  Result Value Ref Range Status   Specimen Description BLOOD LEFT HAND  Final   Special Requests IN PEDIATRIC BOTTLE 4CC  Final   Culture NO GROWTH 5 DAYS  Final   Report Status 04/15/2016 FINAL  Final         Radiology Studies: No results found.  Scheduled Meds: . aspirin EC  81 mg Oral Daily  . carvedilol  25 mg Oral BID WC  . famotidine  20 mg Oral Daily  . furosemide  40 mg Intravenous Daily  . insulin aspart  0-9 Units Subcutaneous TID WC  . [START ON 04/18/2016] insulin aspart  5 Units Subcutaneous TID WC  . [START ON 04/18/2016] insulin glargine  30 Units Subcutaneous Daily  . insulin glargine  5 Units Subcutaneous QHS  . levothyroxine  137 mcg Oral QAC breakfast  . mycophenolate  500 mg Oral BID  . nystatin cream   Topical BID  . predniSONE  5 mg Oral Q breakfast  . [START ON 04/18/2016] sodium bicarbonate  650 mg Oral Daily  . sodium chloride flush  3 mL Intravenous Q12H  . sodium chloride flush  3 mL Intravenous Q12H  . tacrolimus  1 mg Oral BID   Continuous Infusions:     LOS: 6 days    Time spent: 35 minutes.     Albertine GratesXu,Kotaro Buer, MD PhD [facility] Pager [phone]  If 7PM-7AM, please contact night-coverage [email or web address] Password [email or web address] 04/17/2016, 5:48 PM

## 2016-04-18 DIAGNOSIS — E119 Type 2 diabetes mellitus without complications: Secondary | ICD-10-CM

## 2016-04-18 DIAGNOSIS — A419 Sepsis, unspecified organism: Secondary | ICD-10-CM

## 2016-04-18 LAB — MAGNESIUM: Magnesium: 1.6 mg/dL — ABNORMAL LOW (ref 1.7–2.4)

## 2016-04-18 LAB — CBC
HEMATOCRIT: 33.6 % — AB (ref 36.0–46.0)
HEMOGLOBIN: 10.4 g/dL — AB (ref 12.0–15.0)
MCH: 27.7 pg (ref 26.0–34.0)
MCHC: 31 g/dL (ref 30.0–36.0)
MCV: 89.4 fL (ref 78.0–100.0)
Platelets: 207 10*3/uL (ref 150–400)
RBC: 3.76 MIL/uL — ABNORMAL LOW (ref 3.87–5.11)
RDW: 13.9 % (ref 11.5–15.5)
WBC: 3.7 10*3/uL — ABNORMAL LOW (ref 4.0–10.5)

## 2016-04-18 LAB — GLUCOSE, CAPILLARY
GLUCOSE-CAPILLARY: 129 mg/dL — AB (ref 65–99)
Glucose-Capillary: 155 mg/dL — ABNORMAL HIGH (ref 65–99)
Glucose-Capillary: 48 mg/dL — ABNORMAL LOW (ref 65–99)
Glucose-Capillary: 62 mg/dL — ABNORMAL LOW (ref 65–99)
Glucose-Capillary: 133 mg/dL — ABNORMAL HIGH (ref 65–99)
Glucose-Capillary: 187 mg/dL — ABNORMAL HIGH (ref 65–99)

## 2016-04-18 LAB — BASIC METABOLIC PANEL
Anion gap: 8 (ref 5–15)
BUN: 15 mg/dL (ref 6–20)
CHLORIDE: 100 mmol/L — AB (ref 101–111)
CO2: 27 mmol/L (ref 22–32)
CREATININE: 1.09 mg/dL — AB (ref 0.44–1.00)
Calcium: 9.2 mg/dL (ref 8.9–10.3)
GFR calc Af Amer: >60 mL/min (ref >60)
GFR calc non Af Amer: 59 mL/min — ABNORMAL LOW (ref >60)
GLUCOSE: 159 mg/dL — AB (ref 65–99)
Potassium: 4.2 mmol/L (ref 3.5–5.1)
Sodium: 135 mmol/L (ref 135–145)

## 2016-04-18 MED ORDER — MAGNESIUM SULFATE 2 GM/50ML IV SOLN
2.0000 g | Freq: Once | INTRAVENOUS | Status: AC
  Administered 2016-04-18: 2 g via INTRAVENOUS
  Filled 2016-04-18: qty 50

## 2016-04-18 MED ORDER — INSULIN ASPART 100 UNIT/ML ~~LOC~~ SOLN: SUBCUTANEOUS | 0 refills | Status: DC

## 2016-04-18 MED ORDER — SODIUM BICARBONATE 650 MG PO TABS: 650.0000 mg | ORAL_TABLET | Freq: Every day | ORAL | 0 refills | Status: DC

## 2016-04-18 MED ORDER — FUROSEMIDE 40 MG PO TABS: 40.0000 mg | ORAL_TABLET | Freq: Every day | ORAL | 0 refills | Status: DC

## 2016-04-18 MED ORDER — INSULIN GLARGINE 100 UNIT/ML ~~LOC~~ SOLN
28.0000 [IU] | Freq: Every day | SUBCUTANEOUS | Status: DC
  Administered 2016-04-19: 28 [IU] via SUBCUTANEOUS
  Filled 2016-04-18: qty 0.28

## 2016-04-18 NOTE — Discharge Summary (Addendum)
Discharge Summary  [name]:[id number] DOB: [birth year]  PCP: No primary care provider on file.  Admit date: 04/10/2016 Discharge date: 04/19/2016  Time spent: >56mins  Recommendations for Outpatient Follow-up:  1. F/u with PMD at [facility] within a week  for hospital discharge follow up, repeat cbc/bmp at follow up, pmd to continue work with patient for blood sugar control  Discharge Diagnoses:  Active Hospital Problems   Diagnosis Date Noted  . Altered mental status 04/10/2016  . Hypomagnesemia   . Pressure ulcer 04/11/2016  . Acute encephalopathy 04/11/2016  . Altered mental state 04/11/2016  . Insulin dependent diabetes mellitus (HCC) 04/10/2016  . History of renal transplant 09/24/2015  . Essential hypertension 06/01/2015  . Sepsis (HCC) 08/04/2012  . DKA, type 1 (HCC) 09/22/2011  . Immunosuppression [facility])     Resolved Hospital Problems   Diagnosis Date Noted Date Resolved  No resolved problems to display.    Discharge Condition: stable  Diet recommendation: heart healthy/carb modified  Filed Weights   04/11/16 0800  Weight: 104.3 kg (230 lb)    History of present illness:  [name]  is a 50 y.o. female, With past medical history relevant for end-stage renal disease status post kidney transplant in 2011 (currently on antirejection drugs), hypertension , hypothyroidism uncontrolled diabetes with erratic blood glucose, recent admission with blood sugars over 700, however home patient has been having recurrent episodes of symptomatic hypoglycemia. She presents by EMS today after another episode of hypoglycemia. Patient is very confused and unable to give any relevant history, I called and spoke with patient's husband by phone, according to patient's husband patient was in the usual state of health on 04/09/2016 when she went to bed, around 1 AM on 04/10/2016 patient's son found her confused and very diaphoretic. Patient's husband gave  over 1 L of regular Coca-Cola and a candy bar. Patient sugars were not checked, patient remained diaphoretic and confused about an hour after drinking regular Coca-Cola and eating the candy bar so patient's husband called EMS. When EMS arrived at the home patient glucose was 90, it is conceivable with the patient glucose was probably much lower prior to drinking a Coke and eating a candy bar about an hour prior. bruising/ecchymosis in lower abdominal areas noted, patient's husband denies any trauma   ED Course:- CT head, MRI brain without acute findings, UA and chest x-ray without evidence of significant infection. No fevers, no leukocytosis, patient remained confused. Urine drug screen was unremarkable  Admitted with encephalopathy, develops fever whitin 24 hours of admission. Patient continue to be lethargic, develops DKA and she was subsequently transfer to step down. DKA was treated with insulin GTT. She will be transition to long actin insulin 8-08.  Initially there was concern  for meningitis/encephalitis, patient with AMS, fever, on immune suppressive medications. Patient was treated with  Vancomycin, ampicillin , cefepime. Acyclovir for 3 days.  Neurology consulted , discussed with neuro, patient improved, almost back to baseline no need to treat for meningitis, encephalitis. Need to observed off antibiotics. On ancef treating for UTI.    Hospital Course:  Principal Problem:   Altered mental status Active Problems:   DKA, type 1 (HCC)   Immunosuppression (HCC)   Sepsis (HCC)   Essential hypertension   History of renal transplant   Insulin dependent diabetes mellitus (HCC)   Pressure ulcer   Acute encephalopathy   Altered mental state   Hypomagnesemia   1-Acute encephalopathy; fevers, possible from uti ,  metabolic encephalopathy with possible sepsis on presentation with fever, tachycardia. tachypnea Initially there was concern  for meningitis/encephalitis, patient with AMS, fever,  no focal evidence of infection, on immune suppressive medications. Patient was treated with  Vancomycin, ampicillin , cefepime. Acyclovir for 3 days.  Neurology consulted , discussed with neuro, patient improved, almost back to baseline no need to treat for meningitis, encephalitis. Need to observed off antibiotics. On abx treating for UTI.  EEG negative for seizure.  MRI negative for stroke.  All symptom Resolved.   2-insulin dependent DM/DKA; initially with concern of hypoglycemia. Develops DKA.  Gap close, bicarb at 18. transitioned to lantus , SSI.  a1c 12.3, husband report patient blood sugar has been running between 250 to 400 at home. They prefer to be discharged home on insulin 70/30, she has been on 18unit bid, patient and husband agreed to increase to 22units bid, and continue ssi   3-AKI, History of renal transplant on immunosuppressive.  Panda tube was placed,  Continue with ; prednisone, Cellcept, Prograf.  Renal function improving, suspect component hypovolemia, DKA.  Improving with fluids. ivf stopped on 8/8 Tolerating lasix  daily   4-Possible sepsis; fever, tachycardia, tachypnea.  Lactic acid 1.1. Treated with IV fluids and abx urine culture growing klebsiella, , blood culture; no growth. CT abdomen negative for infection.  Chest x ray netive.   5-Hypothyroidism; continue with synthroid.   6-UTI; UA growing klebsiella. On IV antibiotics.  Received vanc/cefepime initially then ancef then oral augmentin, finished abx treatment in the hospital  7. Metabolic acidosis: likely mutifactorial including renal loss and gi loss ( she has diarrhea two days ago), better on bicarb drip, change to oral bicarb supplement  Cough: speech recommended dysphagia II diet with thin liquid, cxr with mild bronchitis changes  Bilateral lower extremity edema: likely from fluids overload,  venous US no DVT and echo cardiogram with grade II diastolic dysfunction, trial of lasix,   discharge home on lasix daily for 10 days, then further lasix dosing per pmd, d/c home med norvasc  Hypomagnesemia: replace mag  Chronic morning headache: Ct head no acute intracranial pathology, 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 She is prescribed with flonase, pmd to refer patient to ENT, pmd to arrange sleep study   Code Status: full code.  Family Communication: talked to husband michael over the phone at length on 8/9 and 8/11 Disposition Plan:  home with home health on 8/13   Consultants:   Neurology   Procedures:   EEG  Antimicrobials:   Vancomycin 8-05---8-07  Cefepime 8-05--80-08 Ancef   Ampicillin 8-05--8-07  Acyclovir 8/05--8-07  augmentin from 8/9 to 8/11    Discharge Exam: BP (!) 145/66 (BP Location: Left Arm)   Pulse 80   Temp 97.8 F (36.6 C)   Resp 18   Ht  (1.727 m)   Wt 104.3 kg (230 lb)   LMP 11/19/2011   SpO2 97%   BMI 34.97 kg/m    General exam: Appears calm and comfortable  Respiratory system: Clear to auscultation. Respiratory effort normal. Cardiovascular system: S1 & S2 heard, RRR. No JVD, murmurs, rubs, gallops or clicks. No pedal edema. Gastrointestinal system: Abdomen is nondistended, soft and nontender. No organomegaly or masses felt. Normal bowel sounds heard. Central nervous system: More alert, follows command, answer some questions. Moves extremities.  Extremities: Symmetric 5 x 5 power. Except for right upper extremity , 4/5. Resolving Pitting edema bilateral lower extremities Skin: No rashes, lesions or ulcers  Discharge Instructions You were cared for by a hospitalist during your hospital stay. If you have any questions about your discharge medications or the care you received while you were in the hospital after you are discharged, you can call the unit and asked to speak with the hospitalist on call if the hospitalist that took care of you is not available. Once you are discharged, your primary  care physician will handle any further medical issues. Please note that NO REFILLS for any discharge medications will be authorized once you are discharged, as it is imperative that you return to your primary care physician (or establish a relationship with a primary care physician if you do not have one) for your aftercare needs so that they can reassess your need for medications and monitor your lab values.  Discharge Instructions    Diet - low sodium heart healthy    Complete by:  As directed   Carb modified   Diet - low sodium heart healthy    Complete by:  As directed   Carb modified   Increase activity slowly    Complete by:  As directed   Increase activity slowly    Complete by:  As directed       Medication List    STOP taking these medications   amLODipine 5 MG tablet Commonly known as:  NORVASC   feeding supplement (GLUCERNA SHAKE) Liqd     TAKE these medications   BAYER BACK & BODY 500-32.5 MG Tabs Generic drug:  Aspirin-Caffeine Take 2 tablets by mouth every 6 (six) hours as needed (pain).   carvedilol 25 MG tablet Commonly known as:  COREG Take 1 tablet (25 mg total) by mouth 2 (two) times daily. What changed:  how much to take   fluticasone 50 MCG/ACT nasal spray Commonly known as:  FLONASE Place 2 sprays into both nostrils daily.   furosemide 40 MG tablet Commonly known as:  LASIX Take 1 tablet (40 mg total) by mouth daily.   insulin aspart 100 UNIT/ML injection Commonly known as:  novoLOG Before each meal 3 times a day, 140-199 - 2 units, 200-250 - 4 units, 251-299 - 6 units,  300-349 - 8 units,  350 or above 10 units. Insulin PEN if approved, provide syringes and needles if needed.   insulin NPH-regular Human (70-30) 100 UNIT/ML injection Commonly known as:  NOVOLIN 70/30 Inject 22 Units into the skin 2 (two) times daily with a meal. What changed:  how much to take   levothyroxine 137 MCG tablet Commonly known as:  SYNTHROID, LEVOTHROID Take 1 tablet  (137 mcg total) by mouth daily. BRAND NAME ONLY   metFORMIN 500 MG 24 hr tablet Commonly known as:  GLUCOPHAGE-XR Take 500 mg by mouth daily with breakfast.   mycophenolate 250 MG capsule Commonly known as:  CELLCEPT Take 500 mg by mouth 2 (two) times daily.   nystatin cream Commonly known as:  MYCOSTATIN Clean groin with soap and water.  Dry area then apply cream to site until healed. What changed:  how much to take  how to take this  when to take this  additional instructions   predniSONE 5 MG tablet Commonly known as:  DELTASONE Take 5 mg by mouth daily.   ranitidine 150 MG tablet Commonly known as:  ZANTAC Take 150 mg by mouth daily.   sodium bicarbonate 650 MG tablet Take 1 tablet (650 mg total) by mouth daily.   tacrolimus 1 MG capsule Commonly known as:  PROGRAF Take 1 mg by mouth 2 (two) times daily.      Allergies  Allergen Reactions  . Ace Inhibitors Cough  . Adhesive [Tape] Itching    Please use paper tape  . Lisinopril Cough   Follow-up Information    [facility]ren Follow up in 1 week(s).   Why:  hospital discharge follow up, repeat cbc/bmp at follow up PMD to refer your to ENT for sinus disease, PMD to schedule sleep study for chronic morning headache Contact information: at [facility] for blood sugar control and insulin does adjustment       [facility] .   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 [place] [address] [phone]            The results of significant diagnostics from this hospitalization (including imaging, microbiology, ancillary and laboratory) are listed below for reference.    Significant Diagnostic Studies: Ct Abdomen Pelvis Wo Contrast  Result Date: 04/10/2016 CLINICAL DATA:  Diffuse abdominal pain, abdominal ecchymosis. History of renal transplant, diabetes. EXAM: CT ABDOMEN AND PELVIS WITHOUT CONTRAST TECHNIQUE: Multidetector CT  imaging of the abdomen and pelvis was performed following the standard protocol without IV contrast. COMPARISON:  Renal ultrasound November 02, 2014 FINDINGS: LUNG BASES: Lung bases are clear. Heart size is normal, mild coronary artery and valvular calcifications. No pericardial effusion. KIDNEYS/BLADDER: Atrophic native kidneys without obstructive uropathy. RIGHT pelvic kidney transplant without hydronephrosis or urolithiasis. Limited assessment renal masses on noncontrast CT. Urinary bladder is well distended and unremarkable. SOLID ORGANS: The liver, spleen, gallbladder, pancreas and adrenal glands are unremarkable for this non-contrast examination. GASTROINTESTINAL TRACT: Very small hiatal hernia. The stomach, small and large bowel are normal in course and caliber without inflammatory changes, the sensitivity may be decreased by lack of enteric contrast. Moderate amount of retained large bowel stool. Normal appendix. PERITONEUM/RETROPERITONEUM: Aortoiliac vessels are normal in course and caliber, moderate calcific atherosclerosis. Severe small vessel atherosclerosis consistent with end-stage renal disease. No lymphadenopathy by CT size criteria. Internal reproductive organs are unremarkable. No intraperitoneal free fluid nor free air. SOFT TISSUES/ OSSEOUS STRUCTURES: Nonsuspicious. Anterior abdominal wall scarring, no focal fluid collection. Normal appearance of the abdominal wall. Moderate L4-5 and L5-S1 degenerative discs, moderate to severe L5-S1 neural foraminal narrowing. IMPRESSION: No acute intra-abdominal or pelvic process. Atrophic native kidneys. RIGHT pelvic renal transplant without urolithiasis or obstructive uropathy. Electronically Signed   By: Awilda Metro M.D.   On: 04/10/2016 04:51   Dg Chest 1 View  Result Date: 04/15/2016 CLINICAL DATA:  Cough EXAM: CHEST 1 VIEW COMPARISON:  04/12/2016 FINDINGS: Cardiomediastinal silhouette is stable. No acute infiltrate or pleural effusion. No  pulmonary edema. Mild perihilar bronchitic changes. IMPRESSION: No infiltrate or pulmonary edema. Mild perihilar bronchitic changes. Electronically Signed   By: Natasha Mead M.D.   On: 04/15/2016 09:45   Dg Chest 2 View  Result Date: 04/10/2016 CLINICAL DATA:  Renal transplant, abdominal ecchymosis. Altered mental status. History of hypertension, diabetes and end-stage renal disease. EXAM: CHEST  2 VIEW COMPARISON:  Chest radiograph March 27, 2016 FINDINGS: Cardiomediastinal silhouette is normal. The lungs are clear without pleural effusions or focal consolidations. Trachea projects midline and there is no pneumothorax. Soft tissue planes and included osseous structures are non-suspicious. Interval removal of LEFT internal jugular central venous catheter. IMPRESSION: No acute cardiopulmonary process. Electronically Signed   By: Awilda Metro M.D.   On: 04/10/2016 03:19   Ct Head Wo Contrast  Result Date: 04/10/2016 CLINICAL DATA:  50 year old female with altered mental status EXAM: CT HEAD WITHOUT CONTRAST TECHNIQUE: Contiguous axial images were obtained from the base of the skull through the vertex without intravenous contrast. COMPARISON:  Head CT dated 10/02/2009 FINDINGS: The ventricles and sulci are appropriate in size for the patient's age. There is no intracranial hemorrhage. No mass effect or midline shift identified. The gray-white matter differentiation is preserved. There is no extra-axial fluid collection. There is extensive mucoperiosteal thickening of paranasal sinuses with complete opacification of the maxillary sinuses, left sphenoid and frontal sinuses, and near complete opacification of the ethmoid air cells. There is a remodeling of the wall of the maxillary sinuses compatible with chronic sinusitis. Correlation with clinical exam recommended. MRI without and with contrast may provide better evaluation and exclude underlying mass if clinically indicated. The mastoid air cells are clear. The  calvarium is intact. IMPRESSION: No acute intracranial pathology. Severe, chronic appearing paranasal sinus disease with remodeling of the walls of the maxillary sinuses. Clinical correlation is recommended. Electronically Signed   By: Elgie Collard M.D.   On: 04/10/2016 04:49   Mr Brain Wo Contrast  Result Date: 04/10/2016 CLINICAL DATA:  Altered mental status EXAM: MRI HEAD WITHOUT CONTRAST TECHNIQUE: Multiplanar, multiecho pulse sequences of the brain and surrounding structures were obtained without intravenous contrast. COMPARISON:  Head CT from earlier today FINDINGS: Calvarium and upper cervical spine: No focal marrow signal abnormality. Orbits: Bilateral cataract resection. Sinuses and Mastoids: Advanced chronic sinusitis with complete opacification/obstruction of the bilateral maxillary, left sphenoid, left anterior and right posterior ethmoids, and left frontal sinuses. Mucosal thickening seen on T2 weighted imaging with central hypo intense inspissated secretions. There could be superimposed fungal colonization. The medial left maxillary sinus wall is bulging, obstructing the middle meatus and deviating the middle turbinates. No evidence of invasive sinusitis in this patient on immunosuppression. Brain: No acute infarct, hemorrhage, hydrocephalus, extra-axial collection or mass lesion. No evidence of large vessel occlusion. Mild patchy white matter hyperintensities attributed to chronic microvascular disease given patient's risk factors. No previous cortical infarct. Normal cerebral volume. IMPRESSION: 1. No acute finding. 2. Advanced chronic sinusitis with diffuse sinus obstruction as described. 3. Mild chronic microvascular disease. Electronically Signed   By: Marnee Spring M.D.   On: 04/10/2016 07:28   Dg Chest Port 1 View  Result Date: 04/12/2016 CLINICAL DATA:  Cough. EXAM: PORTABLE CHEST 1 VIEW COMPARISON:  April 11, 2016 FINDINGS: A feeding tube terminates in the stomach. The heart, hila,  mediastinum, lungs, and pleura are normal. IMPRESSION: The feeding tube terminates in the stomach.  No other abnormalities. Electronically Signed   By: Gerome Sam III M.D   On: 04/12/2016 08:52   Dg Chest Port 1 View  Result Date: 04/11/2016 CLINICAL DATA:  Sepsis EXAM: PORTABLE CHEST 1 VIEW COMPARISON:  04/10/2016 FINDINGS: Normal mediastinum and cardiac silhouette. Normal pulmonary vasculature. No evidence of effusion, infiltrate, or pneumothorax. No acute bony abnormality. IMPRESSION: No acute cardiopulmonary process. Electronically Signed   By: Genevive Bi M.D.   On: 04/11/2016 08:21   Dg Chest Portable 1 View  Result Date: 03/27/2016 CLINICAL DATA:  Central line placement EXAM: PORTABLE CHEST 1 VIEW COMPARISON:  Earlier today at 1229 hours. FINDINGS: 1445 hours. Left internal jugular line terminates at the low SVC. Midline trachea. Normal heart size. The lower chest is partially excluded. Pulmonary interstitial thickening is likely chronic, accentuated by lower lung volumes on the current exam. No pneumothorax. Limited evaluation for pleural fluid.  No lobar consolidation. IMPRESSION: Left-sided central line terminating at the low SVC, without pneumothorax. Exclusion of the lower chest. Interstitial thickening which appears increased, possibly due to lower lung volumes on the current exam. Pulmonary venous congestion cannot be excluded. Electronically Signed   By: Jeronimo Greaves M.D.   On: 03/27/2016 14:57   Dg Chest Portable 1 View  Result Date: 03/27/2016 CLINICAL DATA:  Shortness of breath, weakness EXAM: PORTABLE CHEST 1 VIEW COMPARISON:  11/02/2015 FINDINGS: Increased interstitial markings. No focal consolidation or frank interstitial edema. No pleural effusion or pneumothorax. The heart is normal in size. IMPRESSION: No evidence of acute cardiopulmonary disease. Electronically Signed   By: Charline Bills M.D.   On: 03/27/2016 13:09   Dg Abd Portable 1v  Result Date:  04/11/2016 CLINICAL DATA:  50 year old female with history of feeding tube placement. EXAM: PORTABLE ABDOMEN - 1 VIEW COMPARISON:  Abdominal radiograph 10/23/2014. FINDINGS: Tip of feeding tube is in the proximal body of the stomach. Nonspecific bowel-gas pattern with nondilated gas-filled loops of small bowel in the left side of the abdomen, and otherwise relative paucity of bowel gas. IMPRESSION: 1. Tip of feeding tube is in the proximal body of the stomach. Electronically Signed   By: Trudie Reed M.D.   On: 04/11/2016 17:11    Microbiology: Recent Results (from the past 240 hour(s))  Urine culture     Status: Abnormal   Collection Time: 04/10/16  4:30 AM  Result Value Ref Range Status   Specimen Description URINE, RANDOM  Final   Special Requests NONE  Final   Culture >=100,000 COLONIES/mL KLEBSIELLA PNEUMONIAE (A)  Final   Report Status 04/12/2016 FINAL  Final   Organism ID, Bacteria KLEBSIELLA PNEUMONIAE (A)  Final      Susceptibility   Klebsiella pneumoniae - MIC*    AMPICILLIN 16 RESISTANT Resistant     CEFAZOLIN <=4 SENSITIVE Sensitive     CEFTRIAXONE <=1 SENSITIVE Sensitive     CIPROFLOXACIN <=0.25 SENSITIVE Sensitive     GENTAMICIN <=1 SENSITIVE Sensitive     IMIPENEM <=0.25 SENSITIVE Sensitive     NITROFURANTOIN 64 INTERMEDIATE Intermediate     TRIMETH/SULFA <=20 SENSITIVE Sensitive     AMPICILLIN/SULBACTAM 4 SENSITIVE Sensitive     PIP/TAZO <=4 SENSITIVE Sensitive     Extended ESBL NEGATIVE Sensitive     * >=100,000 COLONIES/mL KLEBSIELLA PNEUMONIAE  Blood culture (routine x 2)     Status: None   Collection Time: 04/10/16  6:00 AM  Result Value Ref Range Status   Specimen Description BLOOD LEFT ARM  Final   Special Requests BOTTLES DRAWN AEROBIC AND ANAEROBIC 10CC  Final   Culture NO GROWTH 5 DAYS  Final   Report Status 04/15/2016 FINAL  Final  Blood culture (routine x 2)     Status: None   Collection Time: 04/10/16  6:05 AM  Result Value Ref Range Status    Specimen Description BLOOD LEFT HAND  Final   Special Requests IN PEDIATRIC BOTTLE 4CC  Final   Culture NO GROWTH 5 DAYS  Final   Report Status 04/15/2016 FINAL  Final     Labs: Basic Metabolic Panel:  Recent Labs Lab 04/15/16 0439 04/16/16 0552 04/17/16 0346 04/18/16 0746 04/19/16 0527  NA 136 137 136 135 132*  K 4.1 4.2 4.4 4.2 4.4  CL 107 104 101 100* 97*  CO2 21* 24 26 27 27   GLUCOSE 75 224* 203* 159* 266*  BUN 13 13 15 15 14   CREATININE 1.09*  1.11* 1.09* 1.09* 1.23*  CALCIUM 8.9 8.9 9.0 9.2 9.5  MG  --  1.1* 1.9 1.6* 1.7   Liver Function Tests:  Recent Labs Lab 04/16/16 0552  AST 11*  ALT 12*  ALKPHOS 90  BILITOT 0.8  PROT 5.2*  ALBUMIN 2.0*   No results for input(s): LIPASE, AMYLASE in the last 168 hours. No results for input(s): AMMONIA in the last 168 hours. CBC:  Recent Labs Lab 04/15/16 0439 04/16/16 0552 04/17/16 0346 04/18/16 0746  WBC 3.6* 3.3* 2.7* 3.7*  HGB 10.2* 9.9* 9.8* 10.4*  HCT 32.2* 31.6* 31.3* 33.6*  MCV 89.7 87.1 88.9 89.4  PLT 98* 118* 138* 207   Cardiac Enzymes: No results for input(s): CKTOTAL, CKMB, CKMBINDEX, TROPONINI in the last 168 hours. BNP: BNP (last 3 results) No results for input(s): BNP in the last 8760 hours.  ProBNP (last 3 results) No results for input(s): PROBNP in the last 8760 hours.  CBG:  Recent Labs Lab 04/18/16 1602 04/18/16 1640 04/18/16 1746 04/18/16 2112 04/19/16 0739  GLUCAP 48* 62* 133* 187* 272*       Signed:  Pia Jedlicka MD, PhD  [facility] 04/19/2016, 11:38 AM

## 2016-04-18 NOTE — Progress Notes (Signed)
Hypoglycemic Event  CBG: 46  Treatment:apple juice  Symptoms: Pale, Sweaty and Shaky  Follow-up CBG: Time: 1640 CBG Result:62 - gave more apple juice- CBG to 133  Possible Reasons for Event: Inadequate meal intake and Medication regimen: .  Comments/MD notified:MD  Notified    Ezequiel KayserSkylar  Kabeer Hoagland

## 2016-04-18 NOTE — Care Management Note (Signed)
Case Management Note  Patient Details  Name: [name] MRN: [id number] Date of Birth: [birth year]  Subjective/Objective:                  hypoglycemia Action/Plan: Discharge planning Expected Discharge Date:  04/18/16               Expected Discharge Plan:  [facility]  In-House Referral:  NA  Discharge planning Services  CM Consult  Post Acute Care Choice:  Home Health Choice offered to:  Patient, Spouse  DME Arranged:  [place] rolling DME Agency:  [facility]  [facility]  [facility]  Status of Service:  Completed, signed off  If discussed at Long Length of Stay Meetings, dates discussed:    Additional Comments: CM received [facility] from RN [facility]  Cm notified [facility] rep, tiffany pt is discharging.  No other  Yves DillJeffries, [name] Lebeau Christine, RN 04/18/2016, 4:28 PM

## 2016-04-18 NOTE — Progress Notes (Signed)
PROGRESS NOTE    [name]  WUJ:[id number] DOB: [birth year] DOA: 04/10/2016 PCP: No primary care provider on file.  Brief Narrative: [name]  is a 50 y.o. female, With past medical history relevant for end-stage renal disease status post kidney transplant in 2011 (currently on antirejection drugs), hypertension , hypothyroidism uncontrolled diabetes with erratic blood glucose, recent admission with blood sugars over 700, however home patient has been having recurrent episodes of symptomatic hypoglycemia. She presents by EMS today after another episode of hypoglycemia. Patient is very confused and unable to give any relevant history, I called and spoke with patient's husband by phone, according to patient's husband patient was in the usual state of health on 04/09/2016 when she went to bed, around 1 AM on 04/10/2016 patient's son found her confused and very diaphoretic. Patient's husband gave over 1 L of regular Coca-Cola and a candy bar. Patient sugars were not checked, patient remained diaphoretic and confused about an hour after drinking regular Coca-Cola and eating the candy bar so patient's husband called EMS. When EMS arrived at the home patient glucose was 90, it is conceivable with the patient glucose was probably much lower prior to drinking a Coke and eating a candy bar about an hour prior. bruising/ecchymosis in lower abdominal areas noted, patient's husband denies any trauma   ED Course:- CT head, MRI brain without acute findings, UA and chest x-ray without evidence of significant infection. No fevers, no leukocytosis, patient remained confused. Urine drug screen was unremarkable  Admitted with encephalopathy, develops fever whitin 24 hours of admission. Patient continue to be lethargic, develops DKA and she was subsequently transfer to step down. DKA was treated with insulin GTT. She will be transition to long actin insulin 8-08.  Initially there was concern  for  meningitis/encephalitis, patient with AMS, fever, on immune suppressive medications. Patient was treated with  Vancomycin, ampicillin , cefepime. Acyclovir for 3 days.  Neurology consulted , discussed with neuro, patient improved, almost back to baseline no need to treat for meningitis, encephalitis. Need to observed off antibiotics. On ancef treating for UTI.    Assessment & Plan:   Principal Problem:   Altered mental status Active Problems:   DKA, type 1 (HCC)   Immunosuppression (HCC)   Sepsis (HCC)   Essential hypertension   History of renal transplant   Diabetes (HCC)   Pressure ulcer   Acute encephalopathy   Altered mental state  1-Acute encephalopathy; fevers, possible from uti , metabolic encephalopathy with possible sepsis on presentation with fever, tachycardia. tachypnea Initially there was concern  for meningitis/encephalitis, patient with AMS, fever, no focal evidence of infection, on immune suppressive medications. Patient was treated with  Vancomycin, ampicillin , cefepime. Acyclovir for 3 days.  Neurology consulted , discussed with neuro, patient improved, almost back to baseline no need to treat for meningitis, encephalitis. Need to observed off antibiotics. On abx treating for UTI.  EEG negative for seizure.  MRI negative for stroke.    2-insulin dependent DM; initially with concern of hypoglycemia. Develops DKA.  Gap close, bicarb at 18. transitioned to lantus , SSI.  a1c 12.3,  Has hypoglycemia episode on 8/12, decrease lantus dose husband report patient blood sugar has been running between 250 to 400 at home. They prefer to be discharged home on insulin 70/30, she has been on 18unit bid, patient and husband agreed to increase to 22units bid, and continue ssi   3-AKI, History of renal transplant on immunosuppressive.  Panda tube was placed,  Continue with ; prednisone, Cellcept, Prograf.  Renal function improving, suspect component hypovolemia, DKA.  Improving  with fluids. ivf stopped on 8/8   4-Possible sepsis; fever, tachycardia, tachypnea.  IV fluids. Lactic acid 1.1. IV antibiotics.  urine culture growing klebsiella, , blood culture; no growth. CT abdomen negative for infection.  Chest x ray netive.   5-Hypothyroidism; continue with synthroid.   6-UTI; UA growing klebsiella. On IV antibiotics.  Received vanc/cefepime initially then ancef then oral augmentin, finished abx treatment in the hospital  7. Metabolic acidosis: likely mutifactorial including renal loss and gi loss ( she has diarrhea two days ago), better on bicarb drip, change to oral bicarb supplement  Cough: speech recommended dysphagia II diet with thin liquid, cxr with mild bronchitis changes  Bilateral lower extremity edema: likely from fluids overload,  venous US no DVT and echo cardiogram with grade II diastolic dysfunction, trial of lasix, will plan to discharge home on lasix and d/c home med norvasc  Hypomagnesemia: replace mag  DVT prophylaxis:  Code Status: full code.  Family Communication: talked to husband michael over the phone at length on 8/9 and 8/11 Disposition Plan: likely home with home health on 8/13,   Consultants:   Neurology   Procedures:   EEG  Antimicrobials:   Vancomycin 8-05---8-07  Cefepime 8-05--80-08 Ancef   Ampicillin 8-05--8-07  Acyclovir 8/05--8-07   Subjective: Planned discharge cancelled due to hypoglycemia episode Speech clear, oriented but poor memory, reported baseline   Objective: Vitals:   04/17/16 2122 04/18/16 0518 04/18/16 1140 04/18/16 1455  BP: (!) 152/72 (!) 144/56 (!) 150/72 130/61  Pulse: 80 77 79 77  Resp:  16    Temp: 97.5 F (36.4 C) 97.4 F (36.3 C) 98.2 F (36.8 C) 98.2 F (36.8 C)  TempSrc:   Oral Oral  SpO2: 98% 97% 95% 93%  Weight:      Height:        Intake/Output Summary (Last 24 hours) at 04/18/16 1702 Last data filed at 04/18/16 1436  Gross per 24 hour  Intake              600 ml   Output                0 ml  Net              600 ml   Filed Weights   04/11/16 0800  Weight: 104.3 kg (230 lb)    Examination:  General exam: Appears calm and comfortable  Respiratory system: Clear to auscultation. Respiratory effort normal. Cardiovascular system: S1 & S2 heard, RRR. No JVD, murmurs, rubs, gallops or clicks. No pedal edema. Gastrointestinal system: Abdomen is nondistended, soft and nontender. No organomegaly or masses felt. Normal bowel sounds heard. Central nervous system: More alert, follows command, answer some questions. Moves extremities.  Extremities: Symmetric 5 x 5 power. Except for right upper extremity , 4/5. Pitting edema bilateral lower extremities Skin: No rashes, lesions or ulcers     Data Reviewed: I have personally reviewed following labs and imaging studies  CBC:  Recent Labs Lab 04/12/16 0359 04/15/16 0439 04/16/16 0552 04/17/16 0346 04/18/16 0746  WBC 11.5* 3.6* 3.3* 2.7* 3.7*  HGB 11.0* 10.2* 9.9* 9.8* 10.4*  HCT 35.9* 32.2* 31.6* 31.3* 33.6*  MCV 90.4 89.7 87.1 88.9 89.4  PLT 153 98* 118* 138* 207   Basic Metabolic Panel:  Recent Labs Lab 04/14/16 2213 04/15/16 0439 04/16/16 0552 04/17/16 0346 04/18/16 0746  NA 135 136 137  136 135  K 4.8 4.1 4.2 4.4 4.2  CL 110 107 104 101 100*  CO2 17* 21* GLUCOSE 184* 75 224* 203* 159*  BUN CREATININE 1.25* 1.09* 1.11* 1.09* 1.09*  CALCIUM 8.9 8.9 8.9 9.0 9.2  MG  --   --  1.1* 1.9 1.6*   GFR: Estimated Creatinine Clearance: 78.9 mL/min (by C-G formula based on SCr of 1.09 mg/dL). Liver Function Tests:  Recent Labs Lab 04/16/16 0552  AST 11*  ALT 12*  ALKPHOS 90  BILITOT 0.8  PROT 5.2*  ALBUMIN 2.0*   No results for input(s): LIPASE, AMYLASE in the last 168 hours. No results for input(s): AMMONIA in the last 168 hours. Coagulation Profile: No results for input(s): INR, PROTIME in the last 168 hours. Cardiac Enzymes: No results for input(s):  CKTOTAL, CKMB, CKMBINDEX, TROPONINI in the last 168 hours. BNP (last 3 results) No results for input(s): PROBNP in the last 8760 hours. HbA1C: No results for input(s): HGBA1C in the last 72 hours. CBG:  Recent Labs Lab 04/17/16 2122 04/18/16 0801 04/18/16 1139 04/18/16 1602 04/18/16 1640  GLUCAP 186* 155* 129* 48* 62*   Lipid Profile: No results for input(s): CHOL, HDL, LDLCALC, TRIG, CHOLHDL, LDLDIRECT in the last 72 hours. Thyroid Function Tests: No results for input(s): TSH, T4TOTAL, FREET4, T3FREE, THYROIDAB in the last 72 hours. Anemia Panel: No results for input(s): VITAMINB12, FOLATE, FERRITIN, TIBC, IRON, RETICCTPCT in the last 72 hours. Sepsis Labs: No results for input(s): PROCALCITON, LATICACIDVEN in the last 168 hours.  Recent Results (from the past 240 hour(s))  Urine culture     Status: Abnormal   Collection Time: 04/10/16  4:30 AM  Result Value Ref Range Status   Specimen Description URINE, RANDOM  Final   Special Requests NONE  Final   Culture >=100,000 COLONIES/mL KLEBSIELLA PNEUMONIAE (A)  Final   Report Status 04/12/2016 FINAL  Final   Organism ID, Bacteria KLEBSIELLA PNEUMONIAE (A)  Final      Susceptibility   Klebsiella pneumoniae - MIC*    AMPICILLIN 16 RESISTANT Resistant     CEFAZOLIN <=4 SENSITIVE Sensitive     CEFTRIAXONE <=1 SENSITIVE Sensitive     CIPROFLOXACIN <=0.25 SENSITIVE Sensitive     GENTAMICIN <=1 SENSITIVE Sensitive     IMIPENEM <=0.25 SENSITIVE Sensitive     NITROFURANTOIN 64 INTERMEDIATE Intermediate     TRIMETH/SULFA <=20 SENSITIVE Sensitive     AMPICILLIN/SULBACTAM 4 SENSITIVE Sensitive     PIP/TAZO <=4 SENSITIVE Sensitive     Extended ESBL NEGATIVE Sensitive     * >=100,000 COLONIES/mL KLEBSIELLA PNEUMONIAE  Blood culture (routine x 2)     Status: None   Collection Time: 04/10/16  6:00 AM  Result Value Ref Range Status   Specimen Description BLOOD LEFT ARM  Final   Special Requests BOTTLES DRAWN AEROBIC AND ANAEROBIC 10CC   Final   Culture NO GROWTH 5 DAYS  Final   Report Status 04/15/2016 FINAL  Final  Blood culture (routine x 2)     Status: None   Collection Time: 04/10/16  6:05 AM  Result Value Ref Range Status   Specimen Description BLOOD LEFT HAND  Final   Special Requests IN PEDIATRIC BOTTLE 4CC  Final   Culture NO GROWTH 5 DAYS  Final   Report Status 04/15/2016 FINAL  Final         Radiology Studies: No results found.      Scheduled Meds: .  aspirin EC  81 mg Oral Daily  . carvedilol  25 mg Oral BID WC  . famotidine  20 mg Oral Daily  . furosemide  40 mg Intravenous Daily  . insulin aspart  0-9 Units Subcutaneous TID WC  . insulin aspart  5 Units Subcutaneous TID WC  . insulin glargine  30 Units Subcutaneous Daily  . insulin glargine  5 Units Subcutaneous QHS  . levothyroxine  137 mcg Oral QAC breakfast  . mycophenolate  500 mg Oral BID  . nystatin cream   Topical BID  . predniSONE  5 mg Oral Q breakfast  . sodium bicarbonate  650 mg Oral Daily  . sodium chloride flush  3 mL Intravenous Q12H  . sodium chloride flush  3 mL Intravenous Q12H  . tacrolimus  1 mg Oral BID   Continuous Infusions:     LOS: 7 days    Time spent: 25 minutes.     Albertine Grates, MD PhD [facility] Pager [phone]  If 7PM-7AM, please contact night-coverage [email or web address] Password [facility] 04/18/2016, 5:02 PM

## 2016-04-18 NOTE — Care Management Note (Signed)
Case Management Note  Patient Details  Name: [name] MRN: [id number] Date of Birth: 1966/02/11  Subjective/Objective:                    Action/Plan:   Expected Discharge Date:                  Expected Discharge Plan:  Home w Home Health Services ([facility]  In-House Referral:  NA  Discharge planning Services  CM Consult  Post Acute Care Choice:  Home Health Choice offered to:  Patient, Spouse  DME Arranged:  NA DME Agency:  NA  HH Arranged:  RN, PT HH Agency:  [facility]  Status of Service:  Completed, signed off  If discussed at Long Length of Stay Meetings, dates discussed:    Additional Comments: CM called [facility],Michael as pt was sleeping to offer choice of home health agency. Family choose [facility] to render HHRN/PT.  Pt has rolling walker at home.  NO other CM needs were communicated. Yves DillJeffries, Red Mandt Christine, RN 04/18/2016, 4:35 PM

## 2016-04-19 DIAGNOSIS — N390 Urinary tract infection, site not specified: Secondary | ICD-10-CM

## 2016-04-19 LAB — GLUCOSE, CAPILLARY
Glucose-Capillary: 272 mg/dL — ABNORMAL HIGH (ref 65–99)
Glucose-Capillary: 143 mg/dL — ABNORMAL HIGH (ref 65–99)
Glucose-Capillary: 73 mg/dL (ref 65–99)

## 2016-04-19 LAB — BASIC METABOLIC PANEL
ANION GAP: 8 (ref 5–15)
BUN: 14 mg/dL (ref 6–20)
CO2: 27 mmol/L (ref 22–32)
Calcium: 9.5 mg/dL (ref 8.9–10.3)
Chloride: 97 mmol/L — ABNORMAL LOW (ref 101–111)
Creatinine, Ser: 1.23 mg/dL — ABNORMAL HIGH (ref 0.44–1.00)
GFR, EST AFRICAN AMERICAN: 59 mL/min — AB (ref >60)
GFR, EST NON AFRICAN AMERICAN: 51 mL/min — AB (ref >60)
GLUCOSE: 266 mg/dL — AB (ref 65–99)
POTASSIUM: 4.4 mmol/L (ref 3.5–5.1)
Sodium: 132 mmol/L — ABNORMAL LOW (ref 135–145)

## 2016-04-19 LAB — MAGNESIUM: Magnesium: 1.7 mg/dL (ref 1.7–2.4)

## 2016-04-19 MED ORDER — FLUTICASONE PROPIONATE 50 MCG/ACT NA SUSP: 2.0000 | Freq: Every day | NASAL | 0 refills | Status: DC

## 2016-04-19 MED ORDER — FLUTICASONE PROPIONATE 50 MCG/ACT NA SUSP
2.0000 | Freq: Every day | NASAL | Status: DC
  Administered 2016-04-19: 2 via NASAL
  Filled 2016-04-19: qty 16

## 2016-04-19 MED ORDER — BUTALBITAL-APAP-CAFFEINE 50-325-40 MG PO TABS
1.0000 | ORAL_TABLET | Freq: Once | ORAL | Status: AC
  Administered 2016-04-19: 1 via ORAL
  Filled 2016-04-19: qty 1

## 2016-05-10 ENCOUNTER — Emergency Department (HOSPITAL_COMMUNITY)
Admission: EM | Admit: 2016-05-10 | Discharge: 2016-05-10 | Disposition: A | Discharge status: Home / Self Care | Payer: Medicare Other | Source: Home / Self Care | Attending: Emergency Medicine | Admitting: Emergency Medicine

## 2016-05-10 ENCOUNTER — Emergency Department (HOSPITAL_COMMUNITY): Payer: Medicare Other

## 2016-05-10 ENCOUNTER — Encounter (HOSPITAL_COMMUNITY): Payer: Self-pay

## 2016-05-10 DIAGNOSIS — E1122 Type 2 diabetes mellitus with diabetic chronic kidney disease: Secondary | ICD-10-CM | POA: Insufficient documentation

## 2016-05-10 DIAGNOSIS — Y92009 Unspecified place in unspecified non-institutional (private) residence as the place of occurrence of the external cause: Principal | ICD-10-CM

## 2016-05-10 DIAGNOSIS — I120 Hypertensive chronic kidney disease with stage 5 chronic kidney disease or end stage renal disease: Secondary | ICD-10-CM | POA: Insufficient documentation

## 2016-05-10 DIAGNOSIS — E11621 Type 2 diabetes mellitus with foot ulcer: Secondary | ICD-10-CM

## 2016-05-10 DIAGNOSIS — S81811A Laceration without foreign body, right lower leg, initial encounter: Secondary | ICD-10-CM | POA: Insufficient documentation

## 2016-05-10 DIAGNOSIS — W19XXXA Unspecified fall, initial encounter: Secondary | ICD-10-CM | POA: Insufficient documentation

## 2016-05-10 DIAGNOSIS — Z7984 Long term (current) use of oral hypoglycemic drugs: Secondary | ICD-10-CM

## 2016-05-10 DIAGNOSIS — Z79899 Other long term (current) drug therapy: Secondary | ICD-10-CM

## 2016-05-10 DIAGNOSIS — L97519 Non-pressure chronic ulcer of other part of right foot with unspecified severity: Secondary | ICD-10-CM | POA: Insufficient documentation

## 2016-05-10 DIAGNOSIS — E1010 Type 1 diabetes mellitus with ketoacidosis without coma: Secondary | ICD-10-CM | POA: Diagnosis not present

## 2016-05-10 DIAGNOSIS — Y999 Unspecified external cause status: Secondary | ICD-10-CM

## 2016-05-10 DIAGNOSIS — Y939 Activity, unspecified: Secondary | ICD-10-CM

## 2016-05-10 DIAGNOSIS — Z794 Long term (current) use of insulin: Secondary | ICD-10-CM | POA: Insufficient documentation

## 2016-05-10 DIAGNOSIS — E039 Hypothyroidism, unspecified: Secondary | ICD-10-CM | POA: Insufficient documentation

## 2016-05-10 DIAGNOSIS — S8001XA Contusion of right knee, initial encounter: Secondary | ICD-10-CM

## 2016-05-10 DIAGNOSIS — Z87891 Personal history of nicotine dependence: Secondary | ICD-10-CM | POA: Insufficient documentation

## 2016-05-10 DIAGNOSIS — Z7982 Long term (current) use of aspirin: Secondary | ICD-10-CM

## 2016-05-10 DIAGNOSIS — N186 End stage renal disease: Secondary | ICD-10-CM | POA: Insufficient documentation

## 2016-05-10 DIAGNOSIS — N390 Urinary tract infection, site not specified: Secondary | ICD-10-CM | POA: Diagnosis not present

## 2016-05-10 MED ORDER — HYDROCODONE-ACETAMINOPHEN 5-325 MG PO TABS: 1.0000 | ORAL_TABLET | ORAL | 0 refills | Status: DC | PRN

## 2016-05-10 MED ORDER — LIDOCAINE-EPINEPHRINE (PF) 2 %-1:200000 IJ SOLN
20.0000 mL | Freq: Once | INTRAMUSCULAR | Status: DC
  Filled 2016-05-10: qty 20

## 2016-05-10 MED ORDER — CEPHALEXIN 500 MG PO CAPS
500.0000 mg | ORAL_CAPSULE | Freq: Four times a day (QID) | ORAL | 0 refills | Status: DC
Start: 2016-05-10 — End: 2016-05-15

## 2016-05-10 MED ORDER — LIDOCAINE-EPINEPHRINE (PF) 2 %-1:200000 IJ SOLN
20.0000 mL | Freq: Once | INTRAMUSCULAR | Status: AC
  Administered 2016-05-10: 20 mL via INTRADERMAL

## 2016-05-10 NOTE — ED Notes (Signed)
Patient transported to X-ray 

## 2016-05-10 NOTE — ED Triage Notes (Addendum)
Patient fell this am forward onto knees. Laceration to right lower leg and also hit head with no loc. Abrasions to knees for same. Alert and oriented, bleeding controlled with dressing. Skin flap noted to right lower leg, saline dressing applied.

## 2016-05-10 NOTE — ED Notes (Signed)
Pt understood dc material. NAD noted. Scripts given at dc 

## 2016-05-10 NOTE — ED Provider Notes (Signed)
LACERATION REPAIR Performed by: Elpidio AnisUPSTILL, Gaylin Bulthuis A Authorized by: Elpidio AnisUPSTILL, Almadelia Looman A Consent: Verbal consent obtained. Risks and benefits: risks, benefits and alternatives were discussed Consent given by: patient Patient identity confirmed: provided demographic data Prepped and Draped in normal sterile fashion Wound explored  Laceration Location: Right posterolateral calf  Laceration Length: Stellate, 4 cm  No Foreign Bodies seen or palpated  Anesthesia: local infiltration  Local anesthetic: lidocaine 2% w/epinephrine  Anesthetic total: 3 ml  Irrigation method: syringe Amount of cleaning: standard  Skin closure: 4-0 ethilon  Number of sutures: 11  Technique: simple interrupted  Patient tolerance: Patient tolerated the procedure well with no immediate complications.    Elpidio AnisShari Gerome Kokesh, PA-C 05/10/16 2233    Linwood DibblesJon Knapp, MD 05/14/16 [phone]

## 2016-05-10 NOTE — ED Provider Notes (Signed)
[facility]-EMERGENCY DEPT Provider Note   CSN: [id number] Arrival date & time: 05/10/16  1713     History   Chief Complaint Chief Complaint  Patient presents with  . Fall  . Laceration    HPI [name] is a 50 y.o. female.  HPI Patient presented to the emergency room for evaluation of a laceration of her lower extremity associated with a fall this morning. The patient stumbled this morning falling on both of her knees. She also thinks she hit the right side of her head. Patient did not lose any consciousness. She's not been having any headache or vomiting. Initially she tried to bandage the laceration but it has continued to bleed throughout the day. Patient is also having persistent soreness in her knees. She is able to walk. Past Medical History:  Diagnosis Date  . Arthritis    "elbows, knees, legs, back" (09/24/2015)  . Daily headache   . Depression    "years ago"  . End stage renal disease (HCC)    right arm AV graft, post transplant  . Hypertension   . Hypothyroid   . 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 Kidney disease   . Pneumonia ~ 2007?  [name]. Type II diabetes mellitus (HCC)    Insulin dependant    Patient Active Problem List   Diagnosis Date Noted  . UTI (lower urinary tract infection)   . Hypomagnesemia   . Pressure ulcer 04/11/2016  . Acute encephalopathy 04/11/2016  . Altered mental state 04/11/2016  . Altered mental status 04/10/2016  . Insulin dependent diabetes mellitus (HCC) 04/10/2016  . DKA (diabetic ketoacidoses) (HCC) 11/02/2015  . Hyperglycemia without ketosis 09/24/2015  . CKD (chronic kidney disease) stage 2, GFR 60-89 ml/min 09/24/2015  . History of renal transplant 09/24/2015  . Uncontrolled type 1 diabetes mellitus with foot ulcer (HCC) 06/04/2015  . History of renal transplantation 06/04/2015  . Foot ulcer, right (HCC)   . Foot abscess, right   . Diarrhea 06/02/2015  . DM (diabetes mellitus) type 2, uncontrolled,  with ketoacidosis (HCC) 06/02/2015  . Type 1 diabetes mellitus with diabetic foot ulcer (HCC) 06/02/2015  . Acute renal failure superimposed on stage 3 chronic kidney disease (HCC) 06/02/2015  . Diabetic foot infection (HCC) 06/01/2015  . Essential hypertension 06/01/2015  . GERD (gastroesophageal reflux disease) 06/01/2015  . End stage renal disease (HCC) 10/24/2012  . H/O kidney transplant 08/04/2012  . High anion gap metabolic acidosis 08/04/2012  . Sepsis (HCC) 08/04/2012  . DKA, type 1 (HCC) 09/22/2011  . Hyperkalemia 09/22/2011  . Immunosuppression (HCC)   . Hypothyroidism     Past Surgical History:  Procedure Laterality Date  . AMPUTATION Left 05/11/2014   Procedure: AMPUTATION LEFT GREAT TOE;  Surgeon: Kathryne Hitchhristopher Y Blackman, MD;  Location: [facility] ORS;  Service: Orthopedics;  Laterality: Left;  . AV FISTULA PLACEMENT Right    forearm  . BACK SURGERY    . CATARACT EXTRACTION W/ INTRAOCULAR LENS  IMPLANT, BILATERAL Bilateral   . CESAREAN SECTION  07/1999  . DG AV DIALYSIS GRAFT DECLOT OR    . INCISION AND DRAINAGE Right 06/02/2015   Procedure: INCISION AND DRAINAGE OF RIGHT 3rd RAY RESECTION;  Surgeon: Kathryne Hitchhristopher Y Blackman, MD;  Location: [facility] OR;  Service: Orthopedics;  Laterality: Right;  . 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 LUMBAR DISC SURGERY  2001  . NEPHRECTOMY TRANSPLANTED ORGAN    . TOE AMPUTATION Right    1,2 & 3rd  toes.  . TUBAL LIGATION  07/1999    OB History    No data available       Home Medications    Prior to Admission medications   Medication Sig Start Date End Date Taking? Authorizing Provider  Aspirin-Caffeine (BAYER BACK & BODY) 500-32.5 MG TABS Take 2 tablets by mouth every 6 (six) hours as needed (pain).   Yes Historical Provider, MD  carvedilol (COREG) 25 MG tablet Take 1 tablet (25 mg total) by mouth 2 (two) times daily. Patient taking differently: Take 50 mg by mouth 2 (two) times daily.  03/30/16  Yes Jeanella Craze, NP  insulin aspart  (NOVOLOG) 100 UNIT/ML injection Before each meal 3 times a day, 140-199 - 2 units, 200-250 - 4 units, 251-299 - 6 units,  300-349 - 8 units,  350 or above 10 units. Insulin PEN if approved, provide syringes and needles if needed. 04/18/16  Yes Albertine Grates, MD  insulin NPH-regular Human (NOVOLIN 70/30) (70-30) 100 UNIT/ML injection Inject 22 Units into the skin 2 (two) times daily with a meal. 04/17/16  Yes Albertine Grates, MD  levothyroxine (SYNTHROID, LEVOTHROID) 137 MCG tablet Take 1 tablet (137 mcg total) by mouth daily. BRAND NAME ONLY 03/04/15  Yes Alison Murray, MD  metFORMIN (GLUCOPHAGE-XR) 500 MG 24 hr tablet Take 500 mg by mouth daily with breakfast.  01/16/16  Yes Historical Provider, MD  mycophenolate (CELLCEPT) 250 MG capsule Take 500 mg by mouth 2 (two) times daily.    Yes Historical Provider, MD  nystatin cream (MYCOSTATIN) Clean groin with soap and water.  Dry area then apply cream to site until healed. Patient taking differently: Apply 1 application topically See admin instructions. Clean groin with soap and water.  Dry area then apply cream to site until healed. 03/30/16  Yes Jeanella Craze, NP  predniSONE (DELTASONE) 5 MG tablet Take 5 mg by mouth daily. 05/29/15  Yes Historical Provider, MD  ranitidine (ZANTAC) 150 MG tablet Take 150 mg by mouth daily. 03/27/16  Yes Historical Provider, MD  sodium bicarbonate 650 MG tablet Take 1 tablet (650 mg total) by mouth daily. Patient taking differently: Take 650 mg by mouth daily as needed for heartburn.  04/18/16  Yes Albertine Grates, MD  tacrolimus (PROGRAF) 1 MG capsule Take 0.5-1 mg by mouth 2 (two) times daily. 1mg  in the morning and 0.5mg  in the evening   Yes Historical Provider, MD  cephALEXin (KEFLEX) 500 MG capsule Take 1 capsule (500 mg total) by mouth 4 (four) times daily. 05/10/16   Elpidio Anis, PA-C  HYDROcodone-acetaminophen (NORCO/VICODIN) 5-325 MG tablet Take 1-2 tablets by mouth every 4 (four) hours as needed. 05/10/16   Elpidio Anis, PA-C    Family  History Family History  Problem Relation Age of Onset  . Emphysema Mother   . Throat cancer Mother   . COPD Mother   . Cancer Mother   . Emphysema Father   . COPD Father   . Stroke Father   . ADD / ADHD Son     Social History Social History  Substance Use Topics  . Smoking status: Former Smoker    Packs/day: 1.00    Years: 11.00    Types: Cigarettes    Quit date: 09/21/1998  . Smokeless tobacco: Never Used  . Alcohol use No     Allergies   Ace inhibitors; Adhesive [tape]; and Lisinopril   Review of Systems Review of Systems  All other systems reviewed and are negative.  Physical Exam Updated Vital Signs BP 165/72   Pulse 76   Temp 97.8 F (36.6 C) (Oral)   Resp 20   LMP 11/19/2011   SpO2 99%   Physical Exam  Constitutional: She appears well-developed and well-nourished. No distress.  HENT:  Head: Normocephalic.  Right Ear: External ear normal.  Left Ear: External ear normal.  Mild tenderness palpation right occiput, no laceration, no large hematoma  Eyes: Conjunctivae are normal. Right eye exhibits no discharge. Left eye exhibits no discharge. No scleral icterus.  Neck: Neck supple. No tracheal deviation present.  Cardiovascular: Normal rate.   Pulmonary/Chest: Effort normal. No stridor. No respiratory distress.  Musculoskeletal: She exhibits no edema.       Legs: Entire spine is nontender, no ankle tenderness bilaterally, no hip tenderness bilaterally, no shoulder or wrist tenderness bilaterally; stellate flap-type laceration lateral mid aspect of her right calf  Neurological: She is alert. Cranial nerve deficit: no gross deficits.  Skin: Skin is warm and dry. No rash noted.  Psychiatric: She has a normal mood and affect.  Nursing note and vitals reviewed.    ED Treatments / Results  Labs (all labs ordered are listed, but only abnormal results are displayed) Labs Reviewed  CBG MONITORING, ED - Abnormal; Notable for the following:       Result  Value   Glucose-Capillary 366 (*)    All other components within normal limits     Radiology Xrays no fx or dislocation  Procedures Procedures (including critical care time)  Medications Ordered in ED Medications  lidocaine-EPINEPHrine (XYLOCAINE W/EPI) 2 %-1:200000 (PF) injection 20 mL (20 mLs Intradermal Given 05/10/16 2303)     Initial Impression / Assessment and Plan / ED Course  I have reviewed the triage vital signs and the nursing notes.  Pertinent labs & imaging results that were available during my care of the patient were reviewed by me and considered in my medical decision making (see chart for details).  Clinical Course  Comment By Time  Tetanus show was within the last 10 years Linwood Dibbles, MD 09/03 1957   Lac repaired by PA [place].    Final Clinical Impressions(s) / ED Diagnoses   Final diagnoses:  Fall at home, initial encounter  Laceration of right lower extremity, initial encounter  Contusion, knee, right, initial encounter    New Prescriptions Discharge Medication List as of 05/10/2016 10:50 PM    START taking these medications   Details  cephALEXin (KEFLEX) 500 MG capsule Take 1 capsule (500 mg total) by mouth 4 (four) times daily., Starting Sun 05/10/2016, Print    HYDROcodone-acetaminophen (NORCO/VICODIN) 5-325 MG tablet Take 1-2 tablets by mouth every 4 (four) hours as needed., Starting Sun 05/10/2016, Print         Linwood Dibbles, MD 05/12/16 2113

## 2016-05-11 LAB — CBG MONITORING, ED: Glucose-Capillary: 366 mg/dL — ABNORMAL HIGH (ref 65–99)

## 2016-05-12 ENCOUNTER — Inpatient Hospital Stay (HOSPITAL_COMMUNITY)
Admission: EM | Admit: 2016-05-12 | Discharge: 2016-05-15 | DRG: 637 | Disposition: A | Discharge status: Skilled Nursing Facility | Payer: Medicare Other | Attending: Internal Medicine | Admitting: Internal Medicine

## 2016-05-12 ENCOUNTER — Encounter (HOSPITAL_COMMUNITY): Payer: Self-pay | Admitting: Emergency Medicine

## 2016-05-12 DIAGNOSIS — N183 Chronic kidney disease, stage 3 unspecified: Secondary | ICD-10-CM | POA: Diagnosis present

## 2016-05-12 DIAGNOSIS — D649 Anemia, unspecified: Secondary | ICD-10-CM | POA: Diagnosis present

## 2016-05-12 DIAGNOSIS — G9340 Encephalopathy, unspecified: Secondary | ICD-10-CM | POA: Diagnosis present

## 2016-05-12 DIAGNOSIS — Z940 Kidney transplant status: Secondary | ICD-10-CM

## 2016-05-12 DIAGNOSIS — E878 Other disorders of electrolyte and fluid balance, not elsewhere classified: Secondary | ICD-10-CM | POA: Diagnosis present

## 2016-05-12 DIAGNOSIS — E1010 Type 1 diabetes mellitus with ketoacidosis without coma: Principal | ICD-10-CM | POA: Diagnosis present

## 2016-05-12 DIAGNOSIS — Z7952 Long term (current) use of systemic steroids: Secondary | ICD-10-CM

## 2016-05-12 DIAGNOSIS — B3749 Other urogenital candidiasis: Secondary | ICD-10-CM | POA: Diagnosis present

## 2016-05-12 DIAGNOSIS — I10 Essential (primary) hypertension: Secondary | ICD-10-CM | POA: Diagnosis present

## 2016-05-12 DIAGNOSIS — E039 Hypothyroidism, unspecified: Secondary | ICD-10-CM | POA: Diagnosis present

## 2016-05-12 DIAGNOSIS — A419 Sepsis, unspecified organism: Secondary | ICD-10-CM | POA: Diagnosis present

## 2016-05-12 DIAGNOSIS — R4182 Altered mental status, unspecified: Secondary | ICD-10-CM

## 2016-05-12 DIAGNOSIS — E1022 Type 1 diabetes mellitus with diabetic chronic kidney disease: Secondary | ICD-10-CM | POA: Diagnosis present

## 2016-05-12 DIAGNOSIS — Z9842 Cataract extraction status, left eye: Secondary | ICD-10-CM

## 2016-05-12 DIAGNOSIS — I130 Hypertensive heart and chronic kidney disease with heart failure and stage 1 through stage 4 chronic kidney disease, or unspecified chronic kidney disease: Secondary | ICD-10-CM | POA: Diagnosis present

## 2016-05-12 DIAGNOSIS — Z961 Presence of intraocular lens: Secondary | ICD-10-CM | POA: Diagnosis present

## 2016-05-12 DIAGNOSIS — G9341 Metabolic encephalopathy: Secondary | ICD-10-CM | POA: Diagnosis present

## 2016-05-12 DIAGNOSIS — Z9841 Cataract extraction status, right eye: Secondary | ICD-10-CM

## 2016-05-12 DIAGNOSIS — E875 Hyperkalemia: Secondary | ICD-10-CM | POA: Diagnosis present

## 2016-05-12 DIAGNOSIS — J329 Chronic sinusitis, unspecified: Secondary | ICD-10-CM | POA: Diagnosis present

## 2016-05-12 DIAGNOSIS — N179 Acute kidney failure, unspecified: Secondary | ICD-10-CM | POA: Diagnosis present

## 2016-05-12 DIAGNOSIS — I4581 Long QT syndrome: Secondary | ICD-10-CM | POA: Diagnosis present

## 2016-05-12 DIAGNOSIS — N390 Urinary tract infection, site not specified: Secondary | ICD-10-CM | POA: Diagnosis present

## 2016-05-12 DIAGNOSIS — E1110 Type 2 diabetes mellitus with ketoacidosis without coma: Secondary | ICD-10-CM | POA: Diagnosis present

## 2016-05-12 DIAGNOSIS — R9431 Abnormal electrocardiogram [ECG] [EKG]: Secondary | ICD-10-CM | POA: Diagnosis present

## 2016-05-12 DIAGNOSIS — E876 Hypokalemia: Secondary | ICD-10-CM | POA: Diagnosis not present

## 2016-05-12 DIAGNOSIS — E860 Dehydration: Secondary | ICD-10-CM | POA: Diagnosis present

## 2016-05-12 DIAGNOSIS — Z9119 Patient's noncompliance with other medical treatment and regimen: Secondary | ICD-10-CM

## 2016-05-12 DIAGNOSIS — Z794 Long term (current) use of insulin: Secondary | ICD-10-CM

## 2016-05-12 DIAGNOSIS — Z87891 Personal history of nicotine dependence: Secondary | ICD-10-CM

## 2016-05-12 LAB — URINE MICROSCOPIC-ADD ON

## 2016-05-12 LAB — BASIC METABOLIC PANEL
ANION GAP: 27 — AB (ref 5–15)
BUN: 33 mg/dL — AB (ref 6–20)
CHLORIDE: 91 mmol/L — AB (ref 101–111)
CO2: 8 mmol/L — ABNORMAL LOW (ref 22–32)
Calcium: 10.4 mg/dL — ABNORMAL HIGH (ref 8.9–10.3)
Creatinine, Ser: 2.3 mg/dL — ABNORMAL HIGH (ref 0.44–1.00)
GFR calc Af Amer: 28 mL/min — ABNORMAL LOW (ref >60)
GFR calc non Af Amer: 24 mL/min — ABNORMAL LOW (ref >60)
Glucose, Bld: 702 mg/dL (ref 65–99)
POTASSIUM: 6.2 mmol/L — AB (ref 3.5–5.1)
Sodium: 126 mmol/L — ABNORMAL LOW (ref 135–145)

## 2016-05-12 LAB — CBC
HEMATOCRIT: 38.9 % (ref 36.0–46.0)
Hemoglobin: 11.6 g/dL — ABNORMAL LOW (ref 12.0–15.0)
MCH: 27.5 pg (ref 26.0–34.0)
MCHC: 29.8 g/dL — ABNORMAL LOW (ref 30.0–36.0)
MCV: 92.2 fL (ref 78.0–100.0)
Platelets: 359 10*3/uL (ref 150–400)
RBC: 4.22 MIL/uL (ref 3.87–5.11)
RDW: 14 % (ref 11.5–15.5)
WBC: 15.7 10*3/uL — AB (ref 4.0–10.5)

## 2016-05-12 LAB — URINALYSIS, ROUTINE W REFLEX MICROSCOPIC
Ketones, ur: >80 mg/dL — AB
Nitrite: NEGATIVE
Protein, ur: 100 mg/dL — AB
SPECIFIC GRAVITY, URINE: 1.026 (ref 1.005–1.030)
pH: 5.5 (ref 5.0–8.0)

## 2016-05-12 LAB — CBG MONITORING, ED
Glucose-Capillary: >600 mg/dL (ref 65–99)
Glucose-Capillary: >600 mg/dL (ref 65–99)

## 2016-05-12 MED ORDER — DEXTROSE-NACL 5-0.45 % IV SOLN
INTRAVENOUS | Status: DC
  Administered 2016-05-13 – 2016-05-14 (×3): via INTRAVENOUS

## 2016-05-12 MED ORDER — SODIUM CHLORIDE 0.9 % IV BOLUS (SEPSIS)
1000.0000 mL | Freq: Once | INTRAVENOUS | Status: AC
  Administered 2016-05-12: 1000 mL via INTRAVENOUS

## 2016-05-12 MED ORDER — SODIUM CHLORIDE 0.9 % IV SOLN
INTRAVENOUS | Status: DC
  Administered 2016-05-12: 5.4 [IU]/h via INTRAVENOUS
  Administered 2016-05-13: 10.8 [IU]/h via INTRAVENOUS
  Filled 2016-05-12: qty 2.5

## 2016-05-12 NOTE — ED Notes (Signed)
CBG = "high" 

## 2016-05-12 NOTE — ED Notes (Signed)
Dr. Wentz notified on pt.'s elevated blood sugar.  

## 2016-05-12 NOTE — ED Triage Notes (Signed)
Pt. reports elevated blood sugar since yesterday , reads " high" at glucometer at home with fatigue , somnolence and emesis , denies fever or chills , respirations unlabored .

## 2016-05-12 NOTE — ED Notes (Signed)
MD at bedside. 

## 2016-05-12 NOTE — ED Provider Notes (Signed)
[facility]-EMERGENCY DEPT Provider Note   CSN: [id number] Arrival date & time: 05/12/16  2037     History   Chief Complaint Chief Complaint  Patient presents with  . Hyperglycemia    HPI [name] is a 50 y.o. female. She presents for evaluation of high blood sugar. She is unable to give any history.  Level V caveat- altered mental status    HPI  Past Medical History:  Diagnosis Date  . Arthritis    "elbows, knees, legs, back" (09/24/2015)  . Daily headache   . Depression    "years ago"  . End stage renal disease (HCC)    right arm AV graft, post transplant  . Hypertension   . Hypothyroid   . 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 Kidney disease   . Pneumonia ~ 2007?  [name] Type II diabetes mellitus (HCC)    Insulin dependant    Patient Active Problem List   Diagnosis Date Noted  . UTI (lower urinary tract infection)   . Hypomagnesemia   . Pressure ulcer 04/11/2016  . Acute encephalopathy 04/11/2016  . Altered mental state 04/11/2016  . Altered mental status 04/10/2016  . Insulin dependent diabetes mellitus (HCC) 04/10/2016  . DKA (diabetic ketoacidoses) (HCC) 11/02/2015  . Hyperglycemia without ketosis 09/24/2015  . CKD (chronic kidney disease) stage 2, GFR 60-89 ml/min 09/24/2015  . History of renal transplant 09/24/2015  . Uncontrolled type 1 diabetes mellitus with foot ulcer (HCC) 06/04/2015  . History of renal transplantation 06/04/2015  . Foot ulcer, right (HCC)   . Foot abscess, right   . Diarrhea 06/02/2015  . DM (diabetes mellitus) type 2, uncontrolled, with ketoacidosis (HCC) 06/02/2015  . Type 1 diabetes mellitus with diabetic foot ulcer (HCC) 06/02/2015  . Acute renal failure superimposed on stage 3 chronic kidney disease (HCC) 06/02/2015  . Diabetic foot infection (HCC) 06/01/2015  . Essential hypertension 06/01/2015  . GERD (gastroesophageal reflux disease) 06/01/2015  . End stage renal disease (HCC) 10/24/2012  . H/O  kidney transplant 08/04/2012  . High anion gap metabolic acidosis 08/04/2012  . Sepsis (HCC) 08/04/2012  . DKA, type 1 (HCC) 09/22/2011  . Hyperkalemia 09/22/2011  . Immunosuppression (HCC)   . Hypothyroidism     Past Surgical History:  Procedure Laterality Date  . AMPUTATION Left 05/11/2014   Procedure: AMPUTATION LEFT GREAT TOE;  Surgeon: Kathryne Hitch, MD;  Location: [facility];  Service: Orthopedics;  Laterality: Left;  . AV FISTULA PLACEMENT Right    forearm  . BACK SURGERY    . CATARACT EXTRACTION W/ INTRAOCULAR LENS  IMPLANT, BILATERAL Bilateral   . CESAREAN SECTION  07/1999  . DG AV DIALYSIS GRAFT DECLOT OR    . INCISION AND DRAINAGE Right 06/02/2015   Procedure: INCISION AND DRAINAGE OF RIGHT 3rd RAY RESECTION;  Surgeon: Kathryne Hitch, MD;  Location: [facility] OR;  Service: Orthopedics;  Laterality: Right;  . 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 LUMBAR DISC SURGERY  2001  . NEPHRECTOMY TRANSPLANTED ORGAN    . TOE AMPUTATION Right    1,2 & 3rd toes.  . TUBAL LIGATION  07/1999    OB History    No data available       Home Medications    Prior to Admission medications   Medication Sig Start Date End Date Taking? Authorizing Provider  Aspirin-Caffeine (BAYER BACK & BODY) 500-32.5 MG TABS Take 2 tablets by mouth every 6 (six) hours as needed (pain).  Historical Provider, MD  carvedilol (COREG) 25 MG tablet Take 1 tablet (25 mg total) by mouth 2 (two) times daily. Patient taking differently: Take 50 mg by mouth 2 (two) times daily.  03/30/16   Jeanella Craze, NP  cephALEXin (KEFLEX) 500 MG capsule Take 1 capsule (500 mg total) by mouth 4 (four) times daily. 05/10/16   Elpidio Anis, PA-C  HYDROcodone-acetaminophen (NORCO/VICODIN) 5-325 MG tablet Take 1-2 tablets by mouth every 4 (four) hours as needed. 05/10/16   Elpidio Anis, PA-C  insulin aspart (NOVOLOG) 100 UNIT/ML injection Before each meal 3 times a day, 140-199 - 2 units, 200-250 - 4 units, 251-299 - 6  units,  300-349 - 8 units,  350 or above 10 units. Insulin PEN if approved, provide syringes and needles if needed. 04/18/16   Albertine Grates, MD  insulin NPH-regular Human (NOVOLIN 70/30) (70-30) 100 UNIT/ML injection Inject 22 Units into the skin 2 (two) times daily with a meal. 04/17/16   Albertine Grates, MD  levothyroxine (SYNTHROID, LEVOTHROID) 137 MCG tablet Take 1 tablet (137 mcg total) by mouth daily. BRAND NAME ONLY 03/04/15   Alison Murray, MD  metFORMIN (GLUCOPHAGE-XR) 500 MG 24 hr tablet Take 500 mg by mouth daily with breakfast.  01/16/16   Historical Provider, MD  mycophenolate (CELLCEPT) 250 MG capsule Take 500 mg by mouth 2 (two) times daily.     Historical Provider, MD  nystatin cream (MYCOSTATIN) Clean groin with soap and water.  Dry area then apply cream to site until healed. Patient taking differently: Apply 1 application topically See admin instructions. Clean groin with soap and water.  Dry area then apply cream to site until healed. 03/30/16   Jeanella Craze, NP  predniSONE (DELTASONE) 5 MG tablet Take 5 mg by mouth daily. 05/29/15   Historical Provider, MD  ranitidine (ZANTAC) 150 MG tablet Take 150 mg by mouth daily. 03/27/16   Historical Provider, MD  sodium bicarbonate 650 MG tablet Take 1 tablet (650 mg total) by mouth daily. Patient taking differently: Take 650 mg by mouth daily as needed for heartburn.  04/18/16   Albertine Grates, MD  tacrolimus (PROGRAF) 1 MG capsule Take 0.5-1 mg by mouth 2 (two) times daily. 1mg  in the morning and 0.5mg  in the evening    Historical Provider, MD    Family History Family History  Problem Relation Age of Onset  . Emphysema Mother   . Throat cancer Mother   . COPD Mother   . Cancer Mother   . Emphysema Father   . COPD Father   . Stroke Father   . ADD / ADHD Son     Social History Social History  Substance Use Topics  . Smoking status: Former Smoker    Packs/day: 1.00    Years: 11.00    Types: Cigarettes    Quit date: 09/21/1998  . Smokeless tobacco:  Never Used  . Alcohol use No     Allergies   Ace inhibitors; Adhesive [tape]; and Lisinopril   Review of Systems Review of Systems  Unable to perform ROS: Mental status change     Physical Exam Updated Vital Signs BP 130/62 (BP Location: Right Arm)   Pulse 96   Temp 97.8 F (36.6 C) (Oral)   Resp 20   LMP 11/19/2011   SpO2 99%   Physical Exam  Constitutional: She appears well-developed.  Appears older than stated age.  HENT:  Head: Normocephalic and atraumatic.  Dry oral mucosa.  Eyes: Conjunctivae and  EOM are normal. Pupils are equal, round, and reactive to light.  Neck: Normal range of motion and phonation normal. Neck supple.  Cardiovascular: Normal rate and regular rhythm.   Pulmonary/Chest: Effort normal and breath sounds normal. No respiratory distress. She has no rales. She exhibits no tenderness.  Abdominal: Soft. She exhibits no distension. There is no tenderness. There is no guarding.  Musculoskeletal: Normal range of motion.  Healing wound, right lower leg, without associated bleeding, drainage or fluctuance. Sutures are intact in the wound.  Neurological: She is alert. She exhibits normal muscle tone.  She is confused.  Skin: Skin is warm and dry.  Psychiatric: She has a normal mood and affect.  Nursing note and vitals reviewed.    ED Treatments / Results  Labs (all labs ordered are listed, but only abnormal results are displayed) Labs Reviewed  BASIC METABOLIC PANEL - Abnormal; Notable for the following:       Result Value   Sodium 126 (*)    Potassium 6.2 (*)    Chloride 91 (*)    CO2 8 (*)    Glucose, Bld 702 (*)    BUN 33 (*)    Creatinine, Ser 2.30 (*)    Calcium 10.4 (*)    GFR calc non Af Amer 24 (*)    GFR calc Af Amer 28 (*)    Anion gap 27 (*)    All other components within normal limits  CBC - Abnormal; Notable for the following:    WBC 15.7 (*)    Hemoglobin 11.6 (*)    MCHC 29.8 (*)    All other components within normal  limits  CBG MONITORING, ED - Abnormal; Notable for the following:    Glucose-Capillary >600 (*)    All other components within normal limits  URINALYSIS, ROUTINE W REFLEX MICROSCOPIC (NOT AT [facility])  CBG MONITORING, ED    EKG  EKG Interpretation None       Radiology No results found.  Procedures Procedures (including critical care time)  Medications Ordered in ED Medications  sodium chloride 0.9 % bolus 1,000 mL (not administered)  dextrose 5 %-0.45 % sodium chloride infusion (not administered)  insulin regular (NOVOLIN R,HUMULIN R) 250 Units in sodium chloride 0.9 % 250 mL (1 Units/mL) infusion (not administered)     Initial Impression / Assessment and Plan / ED Course  I have reviewed the triage vital signs and the nursing notes.  Pertinent labs & imaging results that were available during my care of the patient were reviewed by me and considered in my medical decision making (see chart for details).  Clinical Course    Medications  sodium chloride 0.9 % bolus 1,000 mL (not administered)  dextrose 5 %-0.45 % sodium chloride infusion (not administered)  insulin regular (NOVOLIN R,HUMULIN R) 250 Units in sodium chloride 0.9 % 250 mL (1 Units/mL) infusion (not administered)    Patient Vitals for the past 24 hrs:  BP Temp Temp src Pulse Resp SpO2  05/12/16 2106 130/62 97.8 F (36.6 C) Oral 96 20 99 %    11:36 PM Reevaluation with update and discussion. After initial assessment and treatment, an updated evaluation reveals She is somewhat more alert at this time. Findings discussed with patient and all questions were answered. Roland Lipke L    11:45 PM-Consult complete with Hospitalist. Patient case explained and discussed. She agrees to admit patient for further evaluation and treatment. Call ended at 23:50  CRITICAL CARE Performed by: Flint MelterWENTZ,Renesha Lizama L Total critical  care time: 40 minutes Critical care time was exclusive of separately billable procedures and  treating other patients. Critical care was necessary to treat or prevent imminent or life-threatening deterioration. Critical care was time spent personally by me on the following activities: development of treatment plan with patient and/or surrogate as well as nursing, discussions with consultants, evaluation of patient's response to treatment, examination of patient, obtaining history from patient or surrogate, ordering and performing treatments and interventions, ordering and review of laboratory studies, ordering and review of radiographic studies, pulse oximetry and re-evaluation of patient's condition.   Final Clinical Impressions(s) / ED Diagnoses   Final diagnoses:  Diabetic ketoacidosis without coma associated with type 1 diabetes mellitus (HCC)  Altered mental status, unspecified altered mental status type  UTI (lower urinary tract infection)  Renal transplant recipient    Recurrent DKA with altered mental status, similar presentations previously. She is status post kidney transplant, on Prograf. Somewhat more alert after initial treatment. She will require admission for stabilization including insulin drip, and fluid management.   Nursing Notes Reviewed/ Care Coordinated Applicable Imaging Reviewed Interpretation of Laboratory Data incorporated into ED treatment  Plan: Admit    New Prescriptions New Prescriptions   No medications on file     Mancel Bale, MD 05/13/16 [phone]

## 2016-05-13 ENCOUNTER — Inpatient Hospital Stay (HOSPITAL_COMMUNITY): Payer: Medicare Other

## 2016-05-13 ENCOUNTER — Other Ambulatory Visit (HOSPITAL_COMMUNITY): Payer: Medicare Other

## 2016-05-13 ENCOUNTER — Encounter (HOSPITAL_COMMUNITY): Payer: Self-pay | Admitting: Internal Medicine

## 2016-05-13 DIAGNOSIS — N179 Acute kidney failure, unspecified: Secondary | ICD-10-CM | POA: Diagnosis present

## 2016-05-13 DIAGNOSIS — E878 Other disorders of electrolyte and fluid balance, not elsewhere classified: Secondary | ICD-10-CM | POA: Diagnosis present

## 2016-05-13 DIAGNOSIS — E1010 Type 1 diabetes mellitus with ketoacidosis without coma: Principal | ICD-10-CM

## 2016-05-13 DIAGNOSIS — B3749 Other urogenital candidiasis: Secondary | ICD-10-CM | POA: Diagnosis present

## 2016-05-13 DIAGNOSIS — Z794 Long term (current) use of insulin: Secondary | ICD-10-CM | POA: Diagnosis not present

## 2016-05-13 DIAGNOSIS — G9340 Encephalopathy, unspecified: Secondary | ICD-10-CM | POA: Diagnosis not present

## 2016-05-13 DIAGNOSIS — D649 Anemia, unspecified: Secondary | ICD-10-CM | POA: Diagnosis present

## 2016-05-13 DIAGNOSIS — N390 Urinary tract infection, site not specified: Secondary | ICD-10-CM | POA: Diagnosis present

## 2016-05-13 DIAGNOSIS — R9431 Abnormal electrocardiogram [ECG] [EKG]: Secondary | ICD-10-CM | POA: Diagnosis present

## 2016-05-13 DIAGNOSIS — E875 Hyperkalemia: Secondary | ICD-10-CM | POA: Diagnosis present

## 2016-05-13 DIAGNOSIS — J329 Chronic sinusitis, unspecified: Secondary | ICD-10-CM | POA: Diagnosis present

## 2016-05-13 DIAGNOSIS — Z961 Presence of intraocular lens: Secondary | ICD-10-CM | POA: Diagnosis present

## 2016-05-13 DIAGNOSIS — Z87891 Personal history of nicotine dependence: Secondary | ICD-10-CM | POA: Diagnosis not present

## 2016-05-13 DIAGNOSIS — E1022 Type 1 diabetes mellitus with diabetic chronic kidney disease: Secondary | ICD-10-CM | POA: Diagnosis present

## 2016-05-13 DIAGNOSIS — Z7952 Long term (current) use of systemic steroids: Secondary | ICD-10-CM | POA: Diagnosis not present

## 2016-05-13 DIAGNOSIS — E876 Hypokalemia: Secondary | ICD-10-CM | POA: Diagnosis not present

## 2016-05-13 DIAGNOSIS — E039 Hypothyroidism, unspecified: Secondary | ICD-10-CM | POA: Diagnosis present

## 2016-05-13 DIAGNOSIS — N183 Chronic kidney disease, stage 3 (moderate): Secondary | ICD-10-CM | POA: Diagnosis present

## 2016-05-13 DIAGNOSIS — Z9119 Patient's noncompliance with other medical treatment and regimen: Secondary | ICD-10-CM | POA: Diagnosis not present

## 2016-05-13 DIAGNOSIS — E860 Dehydration: Secondary | ICD-10-CM | POA: Diagnosis present

## 2016-05-13 DIAGNOSIS — I130 Hypertensive heart and chronic kidney disease with heart failure and stage 1 through stage 4 chronic kidney disease, or unspecified chronic kidney disease: Secondary | ICD-10-CM | POA: Diagnosis present

## 2016-05-13 DIAGNOSIS — G9341 Metabolic encephalopathy: Secondary | ICD-10-CM | POA: Diagnosis present

## 2016-05-13 DIAGNOSIS — I4581 Long QT syndrome: Secondary | ICD-10-CM | POA: Diagnosis present

## 2016-05-13 DIAGNOSIS — Z940 Kidney transplant status: Secondary | ICD-10-CM | POA: Diagnosis not present

## 2016-05-13 DIAGNOSIS — Z9841 Cataract extraction status, right eye: Secondary | ICD-10-CM | POA: Diagnosis not present

## 2016-05-13 DIAGNOSIS — E1310 Other specified diabetes mellitus with ketoacidosis without coma: Secondary | ICD-10-CM | POA: Diagnosis not present

## 2016-05-13 DIAGNOSIS — I10 Essential (primary) hypertension: Secondary | ICD-10-CM | POA: Diagnosis not present

## 2016-05-13 DIAGNOSIS — Z9842 Cataract extraction status, left eye: Secondary | ICD-10-CM | POA: Diagnosis not present

## 2016-05-13 LAB — BASIC METABOLIC PANEL
ANION GAP: 9 (ref 5–15)
Anion gap: 22 — ABNORMAL HIGH (ref 5–15)
Anion gap: 19 — ABNORMAL HIGH (ref 5–15)
Anion gap: 7 (ref 5–15)
Anion gap: 9 (ref 5–15)
BUN: 37 mg/dL — ABNORMAL HIGH (ref 6–20)
BUN: 35 mg/dL — AB (ref 6–20)
BUN: 34 mg/dL — ABNORMAL HIGH (ref 6–20)
BUN: 27 mg/dL — AB (ref 6–20)
BUN: 28 mg/dL — AB (ref 6–20)
BUN: 31 mg/dL — ABNORMAL HIGH (ref 6–20)
CALCIUM: 9.4 mg/dL (ref 8.9–10.3)
CALCIUM: 9.3 mg/dL (ref 8.9–10.3)
CHLORIDE: 101 mmol/L (ref 101–111)
CHLORIDE: 107 mmol/L (ref 101–111)
CHLORIDE: 109 mmol/L (ref 101–111)
CHLORIDE: 110 mmol/L (ref 101–111)
CHLORIDE: 111 mmol/L (ref 101–111)
CHLORIDE: 88 mmol/L — AB (ref 101–111)
CO2: <7 mmol/L — ABNORMAL LOW (ref 22–32)
CO2: 10 mmol/L — AB (ref 22–32)
CO2: 11 mmol/L — AB (ref 22–32)
CO2: 17 mmol/L — AB (ref 22–32)
CO2: 19 mmol/L — ABNORMAL LOW (ref 22–32)
CO2: 17 mmol/L — ABNORMAL LOW (ref 22–32)
CREATININE: 1.33 mg/dL — AB (ref 0.44–1.00)
CREATININE: 1.46 mg/dL — AB (ref 0.44–1.00)
CREATININE: 1.6 mg/dL — AB (ref 0.44–1.00)
CREATININE: 2.07 mg/dL — AB (ref 0.44–1.00)
CREATININE: 2.09 mg/dL — AB (ref 0.44–1.00)
CREATININE: 2.5 mg/dL — AB (ref 0.44–1.00)
Calcium: 9.8 mg/dL (ref 8.9–10.3)
Calcium: 9 mg/dL (ref 8.9–10.3)
Calcium: 9.3 mg/dL (ref 8.9–10.3)
Calcium: 9.4 mg/dL (ref 8.9–10.3)
GFR calc Af Amer: 25 mL/min — ABNORMAL LOW (ref >60)
GFR calc non Af Amer: 21 mL/min — ABNORMAL LOW (ref >60)
GFR calc non Af Amer: 27 mL/min — ABNORMAL LOW (ref >60)
GFR calc non Af Amer: 27 mL/min — ABNORMAL LOW (ref >60)
GFR calc non Af Amer: 37 mL/min — ABNORMAL LOW (ref >60)
GFR calc non Af Amer: 46 mL/min — ABNORMAL LOW (ref >60)
GFR, EST AFRICAN AMERICAN: 31 mL/min — AB (ref >60)
GFR, EST AFRICAN AMERICAN: 31 mL/min — AB (ref >60)
GFR, EST AFRICAN AMERICAN: 43 mL/min — AB (ref >60)
GFR, EST AFRICAN AMERICAN: 48 mL/min — AB (ref >60)
GFR, EST AFRICAN AMERICAN: 53 mL/min — AB (ref >60)
GFR, EST NON AFRICAN AMERICAN: 41 mL/min — AB (ref >60)
GLUCOSE: 298 mg/dL — AB (ref 65–99)
GLUCOSE: 295 mg/dL — AB (ref 65–99)
GLUCOSE: 169 mg/dL — AB (ref 65–99)
Glucose, Bld: 744 mg/dL (ref 65–99)
Glucose, Bld: 154 mg/dL — ABNORMAL HIGH (ref 65–99)
Glucose, Bld: 173 mg/dL — ABNORMAL HIGH (ref 65–99)
POTASSIUM: 6 mmol/L — AB (ref 3.5–5.1)
Potassium: 4 mmol/L (ref 3.5–5.1)
Potassium: 4.3 mmol/L (ref 3.5–5.1)
Potassium: 4.2 mmol/L (ref 3.5–5.1)
Potassium: 3.9 mmol/L (ref 3.5–5.1)
Potassium: 4 mmol/L (ref 3.5–5.1)
SODIUM: 126 mmol/L — AB (ref 135–145)
SODIUM: 135 mmol/L (ref 135–145)
SODIUM: 136 mmol/L (ref 135–145)
Sodium: 134 mmol/L — ABNORMAL LOW (ref 135–145)
Sodium: 136 mmol/L (ref 135–145)
Sodium: 137 mmol/L (ref 135–145)

## 2016-05-13 LAB — GLUCOSE, CAPILLARY
GLUCOSE-CAPILLARY: 495 mg/dL — AB (ref 65–99)
GLUCOSE-CAPILLARY: 430 mg/dL — AB (ref 65–99)
GLUCOSE-CAPILLARY: 327 mg/dL — AB (ref 65–99)
GLUCOSE-CAPILLARY: 150 mg/dL — AB (ref 65–99)
GLUCOSE-CAPILLARY: 169 mg/dL — AB (ref 65–99)
GLUCOSE-CAPILLARY: 164 mg/dL — AB (ref 65–99)
GLUCOSE-CAPILLARY: 168 mg/dL — AB (ref 65–99)
GLUCOSE-CAPILLARY: 152 mg/dL — AB (ref 65–99)
Glucose-Capillary: 140 mg/dL — ABNORMAL HIGH (ref 65–99)
Glucose-Capillary: 148 mg/dL — ABNORMAL HIGH (ref 65–99)
Glucose-Capillary: 151 mg/dL — ABNORMAL HIGH (ref 65–99)
Glucose-Capillary: 158 mg/dL — ABNORMAL HIGH (ref 65–99)
Glucose-Capillary: 159 mg/dL — ABNORMAL HIGH (ref 65–99)
Glucose-Capillary: 164 mg/dL — ABNORMAL HIGH (ref 65–99)
Glucose-Capillary: 174 mg/dL — ABNORMAL HIGH (ref 65–99)
Glucose-Capillary: 174 mg/dL — ABNORMAL HIGH (ref 65–99)
Glucose-Capillary: 181 mg/dL — ABNORMAL HIGH (ref 65–99)
Glucose-Capillary: 212 mg/dL — ABNORMAL HIGH (ref 65–99)
Glucose-Capillary: 287 mg/dL — ABNORMAL HIGH (ref 65–99)

## 2016-05-13 LAB — TROPONIN I
TROPONIN I: 0.03 ng/mL — AB (ref <0.03)
TROPONIN I: 0.04 ng/mL — AB (ref <0.03)
Troponin I: 0.08 ng/mL (ref <0.03)

## 2016-05-13 LAB — LACTIC ACID, PLASMA
LACTIC ACID, VENOUS: 2.7 mmol/L — AB (ref 0.5–1.9)
Lactic Acid, Venous: 1.7 mmol/L (ref 0.5–1.9)
Lactic Acid, Venous: 0.9 mmol/L (ref 0.5–1.9)

## 2016-05-13 LAB — I-STAT VENOUS BLOOD GAS, ED
Acid-base deficit: 21 mmol/L — ABNORMAL HIGH (ref 0.0–2.0)
Bicarbonate: 6.9 mmol/L — ABNORMAL LOW (ref 20.0–28.0)
O2 SAT: 50 %
PCO2 VEN: 22.3 mmHg — AB (ref 44.0–60.0)
PO2 VEN: 36 mmHg (ref 32.0–45.0)
TCO2: 8 mmol/L (ref 0–100)
pH, Ven: 7.101 — CL (ref 7.250–7.430)

## 2016-05-13 LAB — MRSA PCR SCREENING: MRSA by PCR: POSITIVE — AB

## 2016-05-13 LAB — CBG MONITORING, ED: Glucose-Capillary: >600 mg/dL (ref 65–99)

## 2016-05-13 MED ORDER — STERILE WATER FOR INJECTION IV SOLN
INTRAVENOUS | Status: DC
  Administered 2016-05-13 – 2016-05-14 (×2): via INTRAVENOUS
  Filled 2016-05-13 (×3): qty 850

## 2016-05-13 MED ORDER — STERILE WATER FOR INJECTION IV SOLN: 3.0000 | INTRAVENOUS | Status: DC

## 2016-05-13 MED ORDER — LEVOTHYROXINE SODIUM 25 MCG PO TABS
137.0000 ug | ORAL_TABLET | Freq: Every day | ORAL | Status: DC
  Administered 2016-05-14 – 2016-05-15 (×2): 137 ug via ORAL
  Filled 2016-05-13 (×2): qty 1

## 2016-05-13 MED ORDER — SODIUM CHLORIDE 0.9 % IV BOLUS (SEPSIS)
1000.0000 mL | Freq: Once | INTRAVENOUS | Status: AC
  Administered 2016-05-13: 1000 mL via INTRAVENOUS

## 2016-05-13 MED ORDER — TACROLIMUS 0.5 MG PO CAPS: 0.5000 mg | ORAL_CAPSULE | Freq: Two times a day (BID) | ORAL | Status: DC

## 2016-05-13 MED ORDER — SODIUM CHLORIDE 0.9 % IV SOLN: INTRAVENOUS | Status: DC

## 2016-05-13 MED ORDER — CHLORHEXIDINE GLUCONATE CLOTH 2 % EX PADS
6.0000 | MEDICATED_PAD | Freq: Every day | CUTANEOUS | Status: DC
  Administered 2016-05-13 – 2016-05-15 (×3): 6 via TOPICAL

## 2016-05-13 MED ORDER — MYCOPHENOLATE MOFETIL 250 MG PO CAPS
500.0000 mg | ORAL_CAPSULE | Freq: Two times a day (BID) | ORAL | Status: DC
  Administered 2016-05-13 – 2016-05-15 (×6): 500 mg via ORAL
  Filled 2016-05-13 (×6): qty 2

## 2016-05-13 MED ORDER — SODIUM CHLORIDE 0.9 % IV SOLN
INTRAVENOUS | Status: DC
Start: 2016-05-13 — End: 2016-05-14
  Administered 2016-05-13: 04:00:00 via INTRAVENOUS

## 2016-05-13 MED ORDER — MUPIROCIN 2 % EX OINT
1.0000 "application " | TOPICAL_OINTMENT | Freq: Two times a day (BID) | CUTANEOUS | Status: DC
  Administered 2016-05-13 – 2016-05-15 (×6): 1 via NASAL
  Filled 2016-05-13: qty 22

## 2016-05-13 MED ORDER — FLUCONAZOLE IN SODIUM CHLORIDE 200-0.9 MG/100ML-% IV SOLN
200.0000 mg | INTRAVENOUS | Status: DC
  Administered 2016-05-13: 200 mg via INTRAVENOUS
  Filled 2016-05-13 (×3): qty 100

## 2016-05-13 MED ORDER — DEXTROSE 5 % IV SOLN
1.0000 g | Freq: Every day | INTRAVENOUS | Status: DC
  Administered 2016-05-13 – 2016-05-15 (×3): 1 g via INTRAVENOUS
  Filled 2016-05-13 (×3): qty 10

## 2016-05-13 MED ORDER — TACROLIMUS 1 MG PO CAPS
1.0000 mg | ORAL_CAPSULE | Freq: Every day | ORAL | Status: DC
  Administered 2016-05-13 – 2016-05-15 (×3): 1 mg via ORAL
  Filled 2016-05-13 (×3): qty 1

## 2016-05-13 MED ORDER — PREDNISONE 10 MG PO TABS
5.0000 mg | ORAL_TABLET | Freq: Every day | ORAL | Status: DC
  Administered 2016-05-13 – 2016-05-15 (×3): 5 mg via ORAL
  Filled 2016-05-13 (×3): qty 1

## 2016-05-13 MED ORDER — DEXTROSE-NACL 5-0.45 % IV SOLN
INTRAVENOUS | Status: DC
Start: 2016-05-13 — End: 2016-05-13

## 2016-05-13 MED ORDER — LORAZEPAM 2 MG/ML IJ SOLN
1.0000 mg | Freq: Once | INTRAMUSCULAR | Status: AC
  Administered 2016-05-13: 1 mg via INTRAVENOUS
  Filled 2016-05-13: qty 1

## 2016-05-13 MED ORDER — CARVEDILOL 25 MG PO TABS
50.0000 mg | ORAL_TABLET | Freq: Two times a day (BID) | ORAL | Status: DC
  Administered 2016-05-13 – 2016-05-15 (×6): 50 mg via ORAL
  Filled 2016-05-13 (×6): qty 2

## 2016-05-13 MED ORDER — TACROLIMUS 0.5 MG PO CAPS
0.5000 mg | ORAL_CAPSULE | Freq: Every day | ORAL | Status: DC
  Administered 2016-05-13 – 2016-05-15 (×3): 0.5 mg via ORAL
  Filled 2016-05-13 (×3): qty 1

## 2016-05-13 NOTE — ED Notes (Addendum)
Pt taken to the floor  

## 2016-05-13 NOTE — Progress Notes (Signed)
Addendum  Discussed with nephrology and when they were evaluating patient was concerned regarding persistent mental status change and possible seizure-like activity. Neurology consulted. Ordered EEG. Reviewed most recent BMP. Bicarbonate 19 and anion gap is 7. Discussed with RN, patient more alert than earlier this afternoon and answers a few questions but remains lethargic. Hence decision to continue IV insulin drip and D5 at this time which can be transitioned to maintenance insulin and sliding-scale when patient alert to take orally consistently.  Marcellus ScottHONGALGI,Amram Maya, MD, FACP, FHM. [facility] Pager [phone]  If 7PM-7AM, please contact night-coverage [email or web address] Password [email or web address] 05/13/2016, 5:20 PM

## 2016-05-13 NOTE — Progress Notes (Signed)
Bedside EEG completed, results pending. 

## 2016-05-13 NOTE — Consult Note (Signed)
Reason for Consult:Acute on chronic renal failure  Referring Physician: [facility]  [name] is an 50 y.o. female.  HPI:   This is a 50 yo woman who presents with acute encephalopathy likele due to DKA. 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  SHe had an episode of wound infection, but no other episodes of cellular or antibody mediated rejection, CMV, or BK viremia, UTI, or nephrolithiasis. Cr on May 06 2016 was 1.52.[name] Patient has not regularly followed up with the transplant clinic and has had several hospitalizations for hypoglycemia or DKA. For her diabetes she is on metformin and NPH 7030.  We were asked to consult due to her acute on chronic renal failure with her history of renal transplant.   PMH:   Past Medical History:  Diagnosis Date  . Arthritis    "elbows, knees, legs, back" (09/24/2015)  . Daily headache   . Depression    "years ago"  . End stage renal disease (HCC)    right arm AV graft, post transplant  . Hypertension   . Hypothyroid   . 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 Kidney disease   . Pneumonia ~ 2007?  [name] Type II diabetes mellitus (HCC)    Insulin dependant    PSH:   Past Surgical History:  Procedure Laterality Date  . AMPUTATION Left 05/11/2014   Procedure: AMPUTATION LEFT GREAT TOE;  Surgeon: Mcarthur Rossetti, MD;  Location: [facility];  Service: Orthopedics;  Laterality: Left;  . AV FISTULA PLACEMENT Right    forearm  . BACK SURGERY    . CATARACT EXTRACTION W/ INTRAOCULAR LENS  IMPLANT, BILATERAL Bilateral   . CESAREAN SECTION  07/1999  . DG AV DIALYSIS GRAFT DECLOT OR    . INCISION AND DRAINAGE Right 06/02/2015   Procedure: INCISION AND DRAINAGE OF RIGHT 3rd RAY RESECTION;  Surgeon: Mcarthur Rossetti, MD;  Location: [facility];  Service: Orthopedics;  Laterality: Right;  . KIDNEY  TRANSPLANT Right December 16, 2009  . [place] SURGERY  2001  . NEPHRECTOMY TRANSPLANTED ORGAN    . TOE AMPUTATION Right    1,2 & 3rd toes.  . TUBAL LIGATION  07/1999    Allergies:  Allergies  Allergen Reactions  . Ace Inhibitors Cough  . Adhesive [Tape] Itching    Please use paper tape  . Lisinopril Cough    Medications:   Prior to Admission medications   Medication Sig Start Date End Date Taking? Authorizing Provider  Aspirin-Caffeine (BAYER BACK & BODY) 500-32.5 MG TABS Take 2 tablets by mouth every 6 (six) hours as needed (pain).    Historical Provider, MD  carvedilol (COREG) 25 MG tablet Take 1 tablet (25 mg total) by mouth 2 (two) times daily. Patient taking differently: Take 50 mg by mouth 2 (two) times daily.  03/30/16   Donita Brooks, NP  cephALEXin (KEFLEX) 500 MG capsule Take 1 capsule (500 mg total) by mouth 4 (four) times daily. 05/10/16   Charlann Lange, PA-C  HYDROcodone-acetaminophen (NORCO/VICODIN) 5-325 MG tablet Take 1-2 tablets by mouth every 4 (four) hours as needed. 05/10/16   Charlann Lange, PA-C  insulin aspart (NOVOLOG) 100 UNIT/ML injection Before each meal 3 times a day, 140-199 - 2 units, 200-250 - 4 units, 251-299 - 6 units,  300-349 -  8 units,  350 or above 10 units. Insulin PEN if approved, provide syringes and needles if needed. 04/18/16   Florencia Reasons, MD  insulin NPH-regular Human (NOVOLIN 70/30) (70-30) 100 UNIT/ML injection Inject 22 Units into the skin 2 (two) times daily with a meal. 04/17/16   Florencia Reasons, MD  levothyroxine (SYNTHROID, LEVOTHROID) 137 MCG tablet Take 1 tablet (137 mcg total) by mouth daily. BRAND NAME ONLY 03/04/15   Robbie Lis, MD  metFORMIN (GLUCOPHAGE-XR) 500 MG 24 hr tablet Take 500 mg by mouth daily with breakfast.  01/16/16   Historical Provider, MD  mycophenolate (CELLCEPT) 250 MG capsule Take 500 mg by mouth 2 (two) times daily.     Historical Provider, MD  nystatin cream (MYCOSTATIN) Clean groin with soap and water.  Dry area then apply  cream to site until healed. Patient taking differently: Apply 1 application topically See admin instructions. Clean groin with soap and water.  Dry area then apply cream to site until healed. 03/30/16   Donita Brooks, NP  predniSONE (DELTASONE) 5 MG tablet Take 5 mg by mouth daily. 05/29/15   Historical Provider, MD  ranitidine (ZANTAC) 150 MG tablet Take 150 mg by mouth daily. 03/27/16   Historical Provider, MD  sodium bicarbonate 650 MG tablet Take 1 tablet (650 mg total) by mouth daily. Patient taking differently: Take 650 mg by mouth daily as needed for heartburn.  04/18/16   Florencia Reasons, MD  tacrolimus (PROGRAF) 1 MG capsule Take 0.5-1 mg by mouth 2 (two) times daily. 30m in the morning and 0.551min the evening    Historical Provider, MD    Discontinued Meds:   Medications Discontinued During This Encounter  Medication Reason  . tacrolimus (PROGRAF) capsule 0.5-1 mg Entry Error  . dextrose 5 %-0.45 % sodium chloride infusion   . 0.9 %  sodium chloride infusion     Social History:  reports that she quit smoking about 17 years ago. Her smoking use included Cigarettes. She has a 11.00 pack-year smoking history. She has never used smokeless tobacco. She reports that she does not drink alcohol or use drugs.  Family History:   Family History  Problem Relation Age of Onset  . Emphysema Mother   . Throat cancer Mother   . COPD Mother   . Cancer Mother   . Emphysema Father   . COPD Father   . Stroke Father   . ADD / ADHD Son    Pertinent items noted in HPI and remainder of comprehensive ROS otherwise negative. Creat  Date/Time Value Ref Range Status  10/29/2014 10:05 AM 1.07 0.50 - 1.10 mg/dL Final   Creatinine, Ser  Date/Time Value Ref Range Status  05/13/2016 08:43 AM 2.07 (H) 0.44 - 1.00 mg/dL Final  05/13/2016 06:45 AM 2.09 (H) 0.44 - 1.00 mg/dL Final  05/13/2016 12:30 AM 2.50 (H) 0.44 - 1.00 mg/dL Final  05/12/2016 09:15 PM 2.30 (H) 0.44 - 1.00 mg/dL Final  04/19/2016 05:27 AM  1.23 (H) 0.44 - 1.00 mg/dL Final  04/18/2016 07:46 AM 1.09 (H) 0.44 - 1.00 mg/dL Final  04/17/2016 03:46 AM 1.09 (H) 0.44 - 1.00 mg/dL Final  04/16/2016 05:52 AM 1.11 (H) 0.44 - 1.00 mg/dL Final  04/15/2016 04:39 AM 1.09 (H) 0.44 - 1.00 mg/dL Final  04/14/2016 10:13 PM 1.25 (H) 0.44 - 1.00 mg/dL Final  04/14/2016 02:27 PM 1.24 (H) 0.44 - 1.00 mg/dL Final  04/14/2016 04:58 AM 1.15 (H) 0.44 - 1.00 mg/dL Final  04/14/2016 12:06 AM  1.26 (H) 0.44 - 1.00 mg/dL Final  04/13/2016 08:39 PM 1.25 (H) 0.44 - 1.00 mg/dL Final  04/13/2016 04:40 PM 1.27 (H) 0.44 - 1.00 mg/dL Final  04/13/2016 12:35 PM 1.15 (H) 0.44 - 1.00 mg/dL Final  04/13/2016 07:52 AM 1.30 (H) 0.44 - 1.00 mg/dL Final  04/13/2016 04:21 AM 1.22 (H) 0.44 - 1.00 mg/dL Final  04/13/2016 12:11 AM 1.26 (H) 0.44 - 1.00 mg/dL Final  04/12/2016 08:09 PM 1.33 (H) 0.44 - 1.00 mg/dL Final  04/12/2016 04:11 PM 1.39 (H) 0.44 - 1.00 mg/dL Final  04/12/2016 11:43 AM 1.47 (H) 0.44 - 1.00 mg/dL Final  04/12/2016 06:36 AM 1.56 (H) 0.44 - 1.00 mg/dL Final  04/12/2016 03:59 AM 1.48 (H) 0.44 - 1.00 mg/dL Final  04/11/2016 09:56 PM 1.46 (H) 0.44 - 1.00 mg/dL Final  04/11/2016 06:15 PM 1.53 (H) 0.44 - 1.00 mg/dL Final  04/11/2016 02:53 PM 1.63 (H) 0.44 - 1.00 mg/dL Final  04/11/2016 09:11 AM 1.69 (H) 0.44 - 1.00 mg/dL Final  04/10/2016 11:37 AM 1.30 (H) 0.44 - 1.00 mg/dL Final  04/10/2016 03:07 AM 1.50 (H) 0.44 - 1.00 mg/dL Final  04/10/2016 02:53 AM 1.60 (H) 0.44 - 1.00 mg/dL Final  03/31/2016 06:14 AM 0.94 0.44 - 1.00 mg/dL Final  03/30/2016 06:52 AM 1.10 (H) 0.44 - 1.00 mg/dL Final  03/29/2016 04:15 AM 1.27 (H) 0.44 - 1.00 mg/dL Final  03/28/2016 06:00 PM 1.40 (H) 0.44 - 1.00 mg/dL Final  03/28/2016 05:00 AM 1.47 (H) 0.44 - 1.00 mg/dL Final  03/28/2016 01:05 AM 1.76 (H) 0.44 - 1.00 mg/dL Final  03/27/2016 09:50 PM 1.71 (H) 0.44 - 1.00 mg/dL Final  03/27/2016 05:39 PM 1.93 (H) 0.44 - 1.00 mg/dL Final  03/27/2016 11:36 AM 1.90 (H) 0.44 - 1.00 mg/dL  Final  03/27/2016 11:19 AM 2.46 (H) 0.44 - 1.00 mg/dL Final  03/14/2016 09:10 PM 1.53 (H) 0.44 - 1.00 mg/dL Final  03/01/2016 06:44 PM 1.26 (H) 0.44 - 1.00 mg/dL Final  11/06/2015 03:17 AM 1.21 (H) 0.44 - 1.00 mg/dL Final  11/05/2015 02:53 PM 1.36 (H) 0.44 - 1.00 mg/dL Final  11/05/2015 03:11 AM 1.21 (H) 0.44 - 1.00 mg/dL Final  11/04/2015 03:45 AM 1.49 (H) 0.44 - 1.00 mg/dL Final  11/03/2015 11:36 PM 1.56 (H) 0.44 - 1.00 mg/dL Final  11/03/2015 07:05 PM 1.68 (H) 0.44 - 1.00 mg/dL Final  11/03/2015 03:01 PM 1.57 (H) 0.44 - 1.00 mg/dL Final  11/03/2015 11:06 AM 1.75 (H) 0.44 - 1.00 mg/dL Final  11/03/2015 06:43 AM 1.84 (H) 0.44 - 1.00 mg/dL Final    Recent Labs Lab 05/12/16 2115 05/13/16 0030 05/13/16 0645 05/13/16 0843  NA 126* 126* 134* 136  K 6.2* 6.0* 4.0 4.3  CL 91* 88* 101 107  CO2 8* <7* 11* 10*  GLUCOSE 702* 744* 298* 295*  BUN 33* 37* 35* 34*  CREATININE 2.30* 2.50* 2.09* 2.07*  CALCIUM 10.4* 9.8 9.0 9.3    Recent Labs Lab 05/12/16 2115  WBC 15.7*  HGB 11.6*  HCT 38.9  MCV 92.2  PLT 359   Liver Function Tests: No results for input(s): AST, ALT, ALKPHOS, BILITOT, PROT, ALBUMIN in the last 168 hours. No results for input(s): LIPASE, AMYLASE in the last 168 hours. No results for input(s): AMMONIA in the last 168 hours. Cardiac Enzymes:  Recent Labs Lab 05/13/16 0030 05/13/16 0645  TROPONINI 0.03* 0.04*   Iron Studies: No results for input(s): IRON, TIBC, TRANSFERRIN, FERRITIN in the last 72 hours.  Results for orders placed or performed  during the hospital encounter of 05/12/16 (from the past 48 hour(s))  CBG monitoring, ED     Status: Abnormal   Collection Time: 05/12/16  9:10 PM  Result Value Ref Range   Glucose-Capillary >600 (HH) 65 - 99 mg/dL   Comment 1 Notify RN    Comment 2 Document in Chart   Basic metabolic panel     Status: Abnormal   Collection Time: 05/12/16  9:15 PM  Result Value Ref Range   Sodium 126 (L) 135 - 145 mmol/L    Potassium 6.2 (H) 3.5 - 5.1 mmol/L   Chloride 91 (L) 101 - 111 mmol/L   CO2 8 (L) 22 - 32 mmol/L   Glucose, Bld 702 (HH) 65 - 99 mg/dL    Comment: CRITICAL RESULT CALLED TO, READ BACK BY AND VERIFIED WITH: J.GLOSTENER,RN 2148 05/12/16 M.CAMPBELL    BUN 33 (H) 6 - 20 mg/dL   Creatinine, Ser 2.30 (H) 0.44 - 1.00 mg/dL   Calcium 10.4 (H) 8.9 - 10.3 mg/dL   GFR calc non Af Amer 24 (L) >60 mL/min   GFR calc Af Amer 28 (L) >60 mL/min    Comment: (NOTE) The eGFR has been calculated using the CKD EPI equation. This calculation has not been validated in all clinical situations. eGFR's persistently <60 mL/min signify possible Chronic Kidney Disease.    Anion gap 27 (H) 5 - 15  CBC     Status: Abnormal   Collection Time: 05/12/16  9:15 PM  Result Value Ref Range   WBC 15.7 (H) 4.0 - 10.5 K/uL   RBC 4.22 3.87 - 5.11 MIL/uL   Hemoglobin 11.6 (L) 12.0 - 15.0 g/dL   HCT 38.9 36.0 - 46.0 %   MCV 92.2 78.0 - 100.0 fL   MCH 27.5 26.0 - 34.0 pg   MCHC 29.8 (L) 30.0 - 36.0 g/dL   RDW 14.0 11.5 - 15.5 %   Platelets 359 150 - 400 K/uL  Urinalysis, Routine w reflex microscopic     Status: Abnormal   Collection Time: 05/12/16  9:16 PM  Result Value Ref Range   Color, Urine YELLOW YELLOW   APPearance CLOUDY (A) CLEAR   Specific Gravity, Urine 1.026 1.005 - 1.030   pH 5.5 5.0 - 8.0   Glucose, UA >1000 (A) NEGATIVE mg/dL   Hgb urine dipstick LARGE (A) NEGATIVE   Bilirubin Urine SMALL (A) NEGATIVE   Ketones, ur >80 (A) NEGATIVE mg/dL   Protein, ur 100 (A) NEGATIVE mg/dL   Nitrite NEGATIVE NEGATIVE   Leukocytes, UA SMALL (A) NEGATIVE  Urine microscopic-add on     Status: Abnormal   Collection Time: 05/12/16  9:16 PM  Result Value Ref Range   Squamous Epithelial / LPF 0-5 (A) NONE SEEN   WBC, UA 6-30 0 - 5 WBC/hpf   RBC / HPF 0-5 0 - 5 RBC/hpf   Bacteria, UA MANY (A) NONE SEEN   Urine-Other YEAST PRESENT   CBG monitoring, ED     Status: Abnormal   Collection Time: 05/12/16 11:45 PM  Result  Value Ref Range   Glucose-Capillary >600 (HH) 65 - 99 mg/dL  Basic metabolic panel     Status: Abnormal   Collection Time: 05/13/16 12:30 AM  Result Value Ref Range   Sodium 126 (L) 135 - 145 mmol/L   Potassium 6.0 (H) 3.5 - 5.1 mmol/L   Chloride 88 (L) 101 - 111 mmol/L   CO2 <7 (L) 22 - 32 mmol/L   Glucose, Bld  744 (HH) 65 - 99 mg/dL    Comment: CRITICAL RESULT CALLED TO, READ BACK BY AND VERIFIED WITH: J.GLOSTNER,RN 0135 05/13/16 M.CAMPBELL    BUN 37 (H) 6 - 20 mg/dL   Creatinine, Ser 2.50 (H) 0.44 - 1.00 mg/dL   Calcium 9.8 8.9 - 10.3 mg/dL   GFR calc non Af Amer 21 (L) >60 mL/min   GFR calc Af Amer 25 (L) >60 mL/min    Comment: (NOTE) The eGFR has been calculated using the CKD EPI equation. This calculation has not been validated in all clinical situations. eGFR's persistently <60 mL/min signify possible Chronic Kidney Disease.   Troponin I (q 6hr x 3)     Status: Abnormal   Collection Time: 05/13/16 12:30 AM  Result Value Ref Range   Troponin I 0.03 (HH) <0.03 ng/mL    Comment: CRITICAL RESULT CALLED TO, READ BACK BY AND VERIFIED WITH: J.GLOSTNER,RN 0135 05/13/16 M.CAMPBELL   Lactic acid, plasma     Status: Abnormal   Collection Time: 05/13/16 12:32 AM  Result Value Ref Range   Lactic Acid, Venous 2.7 (HH) 0.5 - 1.9 mmol/L    Comment: CRITICAL RESULT CALLED TO, READ BACK BY AND VERIFIED WITH: J.GLOSTNER,RN 0132 05/13/16 M.CAMPBELL   I-Stat venous blood gas, ED     Status: Abnormal   Collection Time: 05/13/16 12:54 AM  Result Value Ref Range   pH, Ven 7.101 (LL) 7.250 - 7.430   pCO2, Ven 22.3 (L) 44.0 - 60.0 mmHg   pO2, Ven 36.0 32.0 - 45.0 mmHg   Bicarbonate 6.9 (L) 20.0 - 28.0 mmol/L   TCO2 8 0 - 100 mmol/L   O2 Saturation 50.0 %   Acid-base deficit 21.0 (H) 0.0 - 2.0 mmol/L   Patient temperature HIDE    Sample type VENOUS    Comment NOTIFIED PHYSICIAN   CBG monitoring, ED     Status: Abnormal   Collection Time: 05/13/16  1:03 AM  Result Value Ref Range    Glucose-Capillary >600 (HH) 65 - 99 mg/dL  CBG monitoring, ED     Status: Abnormal   Collection Time: 05/13/16  2:41 AM  Result Value Ref Range   Glucose-Capillary >600 (HH) 65 - 99 mg/dL  MRSA PCR Screening     Status: Abnormal   Collection Time: 05/13/16  3:15 AM  Result Value Ref Range   MRSA by PCR POSITIVE (A) NEGATIVE    Comment:        The GeneXpert MRSA Assay (FDA approved for NASAL specimens only), is one component of a comprehensive MRSA colonization surveillance program. It is not intended to diagnose MRSA infection nor to guide or monitor treatment for MRSA infections. RESULT CALLED TO, READ BACK BY AND VERIFIED WITH: A KORSLIEN,RN '@[id number]'  05/13/16 MKELLY   Glucose, capillary     Status: Abnormal   Collection Time: 05/13/16  3:43 AM  Result Value Ref Range   Glucose-Capillary 495 (H) 65 - 99 mg/dL   Comment 1 Notify RN   Glucose, capillary     Status: Abnormal   Collection Time: 05/13/16  4:55 AM  Result Value Ref Range   Glucose-Capillary 430 (H) 65 - 99 mg/dL  Glucose, capillary     Status: Abnormal   Collection Time: 05/13/16  5:52 AM  Result Value Ref Range   Glucose-Capillary 327 (H) 65 - 99 mg/dL  Troponin I (q 6hr x 3)     Status: Abnormal   Collection Time: 05/13/16  6:45 AM  Result Value Ref Range  Troponin I 0.04 (HH) <0.03 ng/mL    Comment: CRITICAL VALUE NOTED.  VALUE IS CONSISTENT WITH PREVIOUSLY REPORTED AND CALLED VALUE.  Basic metabolic panel     Status: Abnormal   Collection Time: 05/13/16  6:45 AM  Result Value Ref Range   Sodium 134 (L) 135 - 145 mmol/L   Potassium 4.0 3.5 - 5.1 mmol/L   Chloride 101 101 - 111 mmol/L   CO2 11 (L) 22 - 32 mmol/L   Glucose, Bld 298 (H) 65 - 99 mg/dL   BUN 35 (H) 6 - 20 mg/dL   Creatinine, Ser 2.09 (H) 0.44 - 1.00 mg/dL   Calcium 9.0 8.9 - 10.3 mg/dL   GFR calc non Af Amer 27 (L) >60 mL/min   GFR calc Af Amer 31 (L) >60 mL/min    Comment: (NOTE) The eGFR has been calculated using the CKD EPI  equation. This calculation has not been validated in all clinical situations. eGFR's persistently <60 mL/min signify possible Chronic Kidney Disease.    Anion gap 22 (H) 5 - 15  Glucose, capillary     Status: Abnormal   Collection Time: 05/13/16  6:58 AM  Result Value Ref Range   Glucose-Capillary 287 (H) 65 - 99 mg/dL   Comment 1 Notify RN   Lactic acid, plasma     Status: None   Collection Time: 05/13/16  7:31 AM  Result Value Ref Range   Lactic Acid, Venous 1.7 0.5 - 1.9 mmol/L  Glucose, capillary     Status: Abnormal   Collection Time: 05/13/16  8:16 AM  Result Value Ref Range   Glucose-Capillary 212 (H) 65 - 99 mg/dL  Basic metabolic panel     Status: Abnormal   Collection Time: 05/13/16  8:43 AM  Result Value Ref Range   Sodium 136 135 - 145 mmol/L   Potassium 4.3 3.5 - 5.1 mmol/L   Chloride 107 101 - 111 mmol/L   CO2 10 (L) 22 - 32 mmol/L   Glucose, Bld 295 (H) 65 - 99 mg/dL   BUN 34 (H) 6 - 20 mg/dL   Creatinine, Ser 2.07 (H) 0.44 - 1.00 mg/dL   Calcium 9.3 8.9 - 10.3 mg/dL   GFR calc non Af Amer 27 (L) >60 mL/min   GFR calc Af Amer 31 (L) >60 mL/min    Comment: (NOTE) The eGFR has been calculated using the CKD EPI equation. This calculation has not been validated in all clinical situations. eGFR's persistently <60 mL/min signify possible Chronic Kidney Disease.    Anion gap 19 (H) 5 - 15  Glucose, capillary     Status: Abnormal   Collection Time: 05/13/16  9:23 AM  Result Value Ref Range   Glucose-Capillary 174 (H) 65 - 99 mg/dL  Glucose, capillary     Status: Abnormal   Collection Time: 05/13/16 10:28 AM  Result Value Ref Range   Glucose-Capillary 150 (H) 65 - 99 mg/dL  Glucose, capillary     Status: Abnormal   Collection Time: 05/13/16 11:32 AM  Result Value Ref Range   Glucose-Capillary 164 (H) 65 - 99 mg/dL  Glucose, capillary     Status: Abnormal   Collection Time: 05/13/16 12:41 PM  Result Value Ref Range   Glucose-Capillary 151 (H) 65 - 99 mg/dL     Ct Head Wo Contrast  Result Date: 05/13/2016 CLINICAL DATA:  Confusion. Altered mental status. Diabetic ketoacidosis. EXAM: CT HEAD WITHOUT CONTRAST TECHNIQUE: Contiguous axial images were obtained from the base of  the skull through the vertex without intravenous contrast. COMPARISON:  Brain MRI 04/10/2016 FINDINGS: Brain: No mass lesion, intraparenchymal hemorrhage or extra-axial collection. No evidence of acute cortical infarct. Brain parenchyma and CSF-containing spaces are normal for age. Vascular: No hyperdense vessel or atherosclerotic calcification. Skull: Normal visualized skull base, calvarium and extracranial soft tissues. Sinuses/Orbits: Complete opacification of the maxillary sinuses, left frontal sinus and left sphenoid sinus with near complete opacification of the bilateral ethmoid sinuses. Normal orbits. IMPRESSION: 1. No acute intracranial abnormality. 2. Findings of chronic sinusitis including complete opacification of the left frontal, left sphenoid and bilateral maxillary sinuses. Electronically Signed   By: Ulyses Jarred M.D.   On: 05/13/2016 02:16     Blood pressure (!) 150/58, pulse 100, temperature 98.9 F (37.2 C), temperature source Oral, resp. rate (!) 25, height '5\' 6"'  (1.676 m), weight 192 lb 7.4 oz (87.3 kg), last menstrual period 11/19/2011, SpO2 99 %.  General: Vital signs reviewed. Patient altered and unable to respond to stimuli  Cardiovascular: regular rate, rhythm, no murmur appreciated  Pulmonary/Chest: Clear to auscultation bilaterally, no wheezes, rales, or rhonchi. Abdominal: Soft, non-tender, non-distended, BS + Extremities: No lower extremity edema bilaterally, pulses symmetric and intact bilaterally. Has toe amputations, and laceration on right foot     Assessment/Plan:    1. Acute on Chronic CKD3 s/p renal transplant in 2011:. Continue anti rejection medications of tacrolimus and MPM, and prednisone . Patient presented with creatinine of 2.3 , with  baseline creatinine 1, who was just seen at the transplant clinic in [place]. Unknown etiology why creatinine is elevated but most recent one is improved to 1.6 this afternoon 2. Acute encephalopathy: likely due to DKA.  Patient was initially mildly coherent when I examined her, but upon team rounds pt was clenching her teeth and had rigid muscle tone, so she could be having a seizure. Recommend consulting Neurology and get an EEG. 3. Metabolic acidosis: on sodium bicarb  4. Diabetes: presented with DKA. CBGs better and gap is closed  5. HTN: on coreg. Blood pressure stable  6. UTI: on rocephin. Urine culture grew candida. In the setting of renal transplant, recommend treating with diflucan.     Thank you for the consult  [place]  PGY2 Internal Medicine Nephrology Service  05/13/2016, 12:50 PM    I have seen and examined this patient and agree with plan and assessment in the above note with renal recommendations/intervention highlighted. Pt was just recently seen by [facility] transplant clinic with relatively stable renal function.  Current AKI/CKD likely related to her DKA and volume depletion as well as possible ischemic ATN.  Will continue to follow along.  No indication for dialysis at this time and hopefully she will recover renal function with IVF's.  More concerning is her AMS and muscle rigidity.  Have discussed case with Dr. Algis Liming and recommended EEG and possible Neuro evaluation to r/o seizure activity.  Mrs. Gorum has been very noncompliant with her outpatient follow up and often her medications as well.  Underlying cause of DKA is still unknown so would treat funguria given her immunosuppressed status.  She may also require lumbar puncture if her mental status does not improve. Continue with her outpatient immunosuppressive regimen. [place] John A Demetrious Rainford,MD 05/13/2016 3:45 PM

## 2016-05-13 NOTE — ED Notes (Signed)
Attempted to call report

## 2016-05-13 NOTE — H&P (Addendum)
[name] ZOX:[id number] DOB: [birth year] DOA: 05/12/2016     PCP: Dois DavenportICHTER,KAREN L., MD     Patient coming from:  home Lives   With family    Chief Complaint: confusion  HPI: [name] is a 50 y.o. female with medical history significant of end-stage renal disease status post kidney transplant in 2011 (currently on antirejection drugs), hypertension , hypothyroidism uncontrolled diabetes with erratic blood glucose, requiring   admission with blood sugars over 700 as well as recurrent episodes of symptomatichypoglycemia    Presented with Confusion Since yesterday her blood sugar has been elevated and glucometer reading high she had had fatigue and somnolence reports nausea and vomiting denies any fevers or chills  3 day days ago patient has fallen and lacerated her lower extremity this was a mechanical fall she was not sure if she hit her head at that time and presented to emergency department. Patient was discharged to home on Keflex laceration was repaired.  Patient states she has not been taking her medications states she thinks she did not take her insuline because she was too sleepy.  Denies chest pain, no shortness of breath. She reports feeling better after glucose stabilizer was started  Regarding pertinent Chronic problems: Patient has had frequent admissions for hyperglycemia and all hypoglycemia she was admitted in the beginning of August with confusion and was found to be hypoglycemic at that time. During her last admission patient developed fever and DKA she initially was treated with broad-spectrum antibiotics neurology was consulted at that time she was diagnosed with UTI and treated with Ancef. While hospitalized patient developed DKA and had to be transferred to stepdown for glucose stabilization MRI was done and was negative for stroke EEG negative for seizures   IN ER:  Temp (24hrs), Avg:97.8 F (36.6 C), Min:97.8 F (36.6 C), Max:97.8 F (36.6 C)  Satting 92-97% blood pressure 151/75 heart rate 84 Glucose was noted to be above 704 Sodium 126 potassium 6.2 Creatinine 2.3 which is up from baseline of 1.2 AG 27 bicarb 8 WBC 15 Following Medications were ordered in ER: Medications  dextrose 5 %-0.45 % sodium chloride infusion (not administered)  insulin regular (NOVOLIN R,HUMULIN R) 250 Units in sodium chloride 0.9 % 250 mL (1 Units/mL) infusion (5.4 Units/hr Intravenous New Bag/Given 05/12/16 2356)  sodium chloride 0.9 % bolus 1,000 mL (1,000 mLs Intravenous New Bag/Given 05/12/16 2342)  sodium chloride 0.9 % bolus 1,000 mL (1,000 mLs Intravenous New Bag/Given 05/12/16 2345)     Hospitalist was called for admission for DKA associated with acute encephalopathy  Review of Systems:    Pertinent positives include:  fatigue, confusion  Constitutional:  No weight loss, night sweats, Fevers, chills, weight loss  HEENT:  No headaches, Difficulty swallowing,Tooth/dental problems,Sore throat,  No sneezing, itching, ear ache, nasal congestion, post nasal drip,  Cardio-vascular:  No chest pain, Orthopnea, PND, anasarca, dizziness, palpitations.no Bilateral lower extremity swelling  GI:  No heartburn, indigestion, abdominal pain, nausea, vomiting, diarrhea, change in bowel habits, loss of appetite, melena, blood in stool, hematemesis Resp:  no shortness of breath at rest. No dyspnea on exertion, No excess mucus, no productive cough, No non-productive cough, No coughing up of blood.No change in color of mucus.No wheezing. Skin:  no rash or lesions. No jaundice GU:  no dysuria, change in color of urine, no urgency or frequency. No straining to urinate.  No flank pain.  Musculoskeletal:  No joint pain or no joint swelling. No decreased range  of motion. No back pain.  Psych:  No change in mood or affect. No depression or anxiety. No memory loss.  Neuro: no localizing neurological complaints, no tingling, no weakness, no double vision, no gait  abnormality, no slurred speech, no  As per HPI otherwise 10 point review of systems negative.   Past Medical History: Past Medical History:  Diagnosis Date  . Arthritis    "elbows, knees, legs, back" (09/24/2015)  . Daily headache   . Depression    "years ago"  . End stage renal disease (HCC)    right arm AV graft, post transplant  . Hypertension   . Hypothyroid   . 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 Kidney disease   . Pneumonia ~ 2007?  [name] Type II diabetes mellitus (HCC)    Insulin dependant   Past Surgical History:  Procedure Laterality Date  . AMPUTATION Left 05/11/2014   Procedure: AMPUTATION LEFT GREAT TOE;  Surgeon: Kathryne Hitch, MD;  Location: [facility];  Service: Orthopedics;  Laterality: Left;  . AV FISTULA PLACEMENT Right    forearm  . BACK SURGERY    . CATARACT EXTRACTION W/ INTRAOCULAR LENS  IMPLANT, BILATERAL Bilateral   . CESAREAN SECTION  07/1999  . DG AV DIALYSIS GRAFT DECLOT OR    . INCISION AND DRAINAGE Right 06/02/2015   Procedure: INCISION AND DRAINAGE OF RIGHT 3rd RAY RESECTION;  Surgeon: Kathryne Hitch, MD;  Location: [facility];  Service: Orthopedics;  Laterality: Right;  . 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 LUMBAR DISC SURGERY  2001  . NEPHRECTOMY TRANSPLANTED ORGAN    . TOE AMPUTATION Right    1,2 & 3rd toes.  . TUBAL LIGATION  07/1999     Social History:  Ambulatory  Independently     reports that she quit smoking about 17 years ago. Her smoking use included Cigarettes. She has a 11.00 pack-year smoking history. She has never used smokeless tobacco. She reports that she does not drink alcohol or use drugs.  Allergies:   Allergies  Allergen Reactions  . Ace Inhibitors Cough  . Adhesive [Tape] Itching    Please use paper tape  . Lisinopril Cough       Family History:   Family History  Problem Relation Age of Onset  . Emphysema Mother   . Throat cancer Mother   . COPD Mother   . Cancer  Mother   . Emphysema Father   . COPD Father   . Stroke Father   . ADD / ADHD Son     Medications: Prior to Admission medications   Medication Sig Start Date End Date Taking? Authorizing Provider  Aspirin-Caffeine (BAYER BACK & BODY) 500-32.5 MG TABS Take 2 tablets by mouth every 6 (six) hours as needed (pain).    Historical Provider, MD  carvedilol (COREG) 25 MG tablet Take 1 tablet (25 mg total) by mouth 2 (two) times daily. Patient taking differently: Take 50 mg by mouth 2 (two) times daily.  03/30/16   Jeanella Craze, NP  cephALEXin (KEFLEX) 500 MG capsule Take 1 capsule (500 mg total) by mouth 4 (four) times daily. 05/10/16   Elpidio Anis, PA-C  HYDROcodone-acetaminophen (NORCO/VICODIN) 5-325 MG tablet Take 1-2 tablets by mouth every 4 (four) hours as needed. 05/10/16   Elpidio Anis, PA-C  insulin aspart (NOVOLOG) 100 UNIT/ML injection Before each meal 3 times a day, 140-199 - 2 units, 200-250 - 4 units, 251-299 - 6 units,  300-349 - 8 units,  350 or above 10 units. Insulin PEN if approved, provide syringes and needles if needed. 04/18/16   Albertine Grates, MD  insulin NPH-regular Human (NOVOLIN 70/30) (70-30) 100 UNIT/ML injection Inject 22 Units into the skin 2 (two) times daily with a meal. 04/17/16   Albertine Grates, MD  levothyroxine (SYNTHROID, LEVOTHROID) 137 MCG tablet Take 1 tablet (137 mcg total) by mouth daily. BRAND NAME ONLY 03/04/15   Alison Murray, MD  metFORMIN (GLUCOPHAGE-XR) 500 MG 24 hr tablet Take 500 mg by mouth daily with breakfast.  01/16/16   Historical Provider, MD  mycophenolate (CELLCEPT) 250 MG capsule Take 500 mg by mouth 2 (two) times daily.     Historical Provider, MD  nystatin cream (MYCOSTATIN) Clean groin with soap and water.  Dry area then apply cream to site until healed. Patient taking differently: Apply 1 application topically See admin instructions. Clean groin with soap and water.  Dry area then apply cream to site until healed. 03/30/16   Jeanella Craze, NP  predniSONE  (DELTASONE) 5 MG tablet Take 5 mg by mouth daily. 05/29/15   Historical Provider, MD  ranitidine (ZANTAC) 150 MG tablet Take 150 mg by mouth daily. 03/27/16   Historical Provider, MD  sodium bicarbonate 650 MG tablet Take 1 tablet (650 mg total) by mouth daily. Patient taking differently: Take 650 mg by mouth daily as needed for heartburn.  04/18/16   Albertine Grates, MD  tacrolimus (PROGRAF) 1 MG capsule Take 0.5-1 mg by mouth 2 (two) times daily. 1mg  in the morning and 0.5mg  in the evening    Historical Provider, MD    Physical Exam: Patient Vitals for the past 24 hrs:  BP Temp Temp src Pulse Resp SpO2  05/12/16 2215 125/78 - - 104 - 99 %  05/12/16 2145 113/70 - - 103 - 100 %  05/12/16 2106 130/62 97.8 F (36.6 C) Oral 96 20 99 %    1. General:  in No Acute distress 2. Psychological: Alert    Oriented to self and place but not situation 3. Head/ENT:     Dry Mucous Membranes                          Head Non traumatic, neck supple                           Poor Dentition 4. SKIN:   decreased Skin turgor,  Skin clean Dry and intact no rash 5. Heart: Regular rate and rhythm no  Murmur, Rub or gallop 6. Lungs: Clear to auscultation bilaterally, no wheezes or crackles   7. Abdomen: Soft,  non-tender, Non distended 8. Lower extremities: no clubbing, cyanosis, or edema 9. Neurologically Grossly intact, moving all 4 extremities equally  10. MSK: Normal range of motion   body mass index is unknown because there is no height or weight on file.  Labs on Admission:   Labs on Admission: I have personally reviewed following labs and imaging studies  CBC:  Recent Labs Lab 05/12/16 2115  WBC 15.7*  HGB 11.6*  HCT 38.9  MCV 92.2  PLT 359   Basic Metabolic Panel:  Recent Labs Lab 05/12/16 2115  NA 126*  K 6.2*  CL 91*  CO2 8*  GLUCOSE 702*  BUN 33*  CREATININE 2.30*  CALCIUM 10.4*   GFR: CrCl cannot be calculated (Unknown ideal weight.). Liver Function Tests:  No results for  input(s): AST, ALT, ALKPHOS, BILITOT, PROT, ALBUMIN in the last 168 hours. No results for input(s): LIPASE, AMYLASE in the last 168 hours. No results for input(s): AMMONIA in the last 168 hours. Coagulation Profile: No results for input(s): INR, PROTIME in the last 168 hours. Cardiac Enzymes: No results for input(s): CKTOTAL, CKMB, CKMBINDEX, TROPONINI in the last 168 hours. BNP (last 3 results) No results for input(s): PROBNP in the last 8760 hours. HbA1C: No results for input(s): HGBA1C in the last 72 hours. CBG:  Recent Labs Lab 05/10/16 1747 05/12/16 2110 05/12/16 2345  GLUCAP 366* >600* >600*   Lipid Profile: No results for input(s): CHOL, HDL, LDLCALC, TRIG, CHOLHDL, LDLDIRECT in the last 72 hours. Thyroid Function Tests: No results for input(s): TSH, T4TOTAL, FREET4, T3FREE, THYROIDAB in the last 72 hours. Anemia Panel: No results for input(s): VITAMINB12, FOLATE, FERRITIN, TIBC, IRON, RETICCTPCT in the last 72 hours. Urine analysis:    Component Value Date/Time   COLORURINE YELLOW 05/12/2016 2116   APPEARANCEUR CLOUDY (A) 05/12/2016 2116   LABSPEC 1.026 05/12/2016 2116   PHURINE 5.5 05/12/2016 2116   GLUCOSEU >1000 (A) 05/12/2016 2116   HGBUR LARGE (A) 05/12/2016 2116   BILIRUBINUR SMALL (A) 05/12/2016 2116   KETONESUR >80 (A) 05/12/2016 2116   PROTEINUR 100 (A) 05/12/2016 2116   UROBILINOGEN 0.2 03/02/2015 1153   NITRITE NEGATIVE 05/12/2016 2116   LEUKOCYTESUR SMALL (A) 05/12/2016 2116   Sepsis Labs: @LABRCNTIP (procalcitonin:4,lacticidven:4) )No results found for this or any previous visit (from the past 240 hour(s)).     UA   evidence of UTI     Lab Results  Component Value Date   HGBA1C 12.3 (H) 03/30/2016    CrCl cannot be calculated (Unknown ideal weight.).  BNP (last 3 results) No results for input(s): PROBNP in the last 8760 hours.   ECG REPORT  Independently reviewed Rate:111  Rhythm: sinus tachycardia, RBBB ST&T Change: peaked t  waves QTC 518  There were no vitals filed for this visit.   Cultures:    Component Value Date/Time   SDES BLOOD LEFT HAND 04/10/2016 0605   SPECREQUEST IN PEDIATRIC BOTTLE 4CC 04/10/2016 0605   CULT NO GROWTH 5 DAYS 04/10/2016 0605   REPTSTATUS 04/15/2016 FINAL 04/10/2016 0605     Radiological Exams on Admission: No results found.  Chart has been reviewed    Assessment/Plan   50 y.o. female with medical history significant of end-stage renal disease status post kidney transplant in 2011 (currently on antirejection drugs), hypertension , hypothyroidism uncontrolled diabetes with erratic blood glucose, requiring   admission with blood sugars over 700 as well as recurrent episodes of symptomatichypoglycemia and admitted for DKA and acute encephalopathy in the setting of UTI  Present on Admission: . DKA (diabetic ketoacidoses) (HCC)  - will admit per DKA protocol, obtain serial BMET, start on glucosestabalizer, aggressive IVF. Change IVF to D5 1/2Na after BG <250 . Will work up cause of DKA with  , ECG one set of cardiac enzymes,   Monitor in [place]. Replace potassium as needed.   Patient briefly lost IV access resulting in delay in care currently IV access reestablished.   . Acute encephalopathy - in the setting of DKA, dehydration and possible UTI, improving after rehydration, given recent fall will obtain CT head . Acute renal failure superimposed on stage 3 chronic kidney disease (HCC) -  Will rehydrate, will need nephrology consult in AM given renal transplant status . DM (diabetes mellitus) type 2, uncontrolled, with  ketoacidosis (HCC) on glucose stabilizer admit to stepdown Sepsis patient meeting sepsis criteria most likely sourse being UTI, differential diagnosis being DKA and dehydration.  . Essential hypertension - continue metoprolol . UTI (lower urinary tract infection) - treat with rocephin  sp renal transplant continue anti -rejection medications, will need nephrology  consult in AM QT -prolongation - will monitor on tele avoid QT prolonging medications, rehydrate correct electrolytes  Other plan as per orders.  DVT prophylaxis:  SCD    Code Status:  FULL CODE   as per patient    Family Communication:   Family not  at  Bedside    Disposition Plan:      To home once workup is complete and patient is stable                                              Diabetes coordinator   consulted                          Consults called: PCCM notified, left msg for nephrology  Admission status:   inpatient      Level of care      SDU      I have spent a total of 75 min on this admission  At bed side time spent in direct care 35 min Angle Karel 05/13/2016, 1:17 AM    [facility]  Pager [phone]   after 2 AM please page floor coverage PA If 7AM-7PM, please contact the day team taking care of the patient  [email or web address]  Password [email or web address]

## 2016-05-13 NOTE — Progress Notes (Signed)
Inpatient Diabetes Program Recommendations  AACE/ADA: New Consensus Statement on Inpatient Glycemic Control (2015)  Target Ranges:  Prepandial:   less than 140 mg/dL      Peak postprandial:   less than 180 mg/dL (1-2 hours)      Critically ill patients:  140 - 180 mg/dL   Results for [name], [name] (MRN [id number]) as of 05/13/2016 11:00  Ref. Range 03/30/2016 06:25  Hemoglobin A1C Latest Ref Range: 4.8 - 5.6 % 12.3 (H)    Review of Glycemic Control  Diabetes history: DM2 Outpatient Diabetes medications: 70/30 22 units ac bid+ Metformin 500 mg qd + Novolog correction tid with meals Current orders for Inpatient glycemic control: IV insulin  Inpatient Diabetes Program Recommendations:  Noted A1c pending. Attempted to speak with patient but pt. nauseated and did not feel like talking @ time of visit. Will follow while in the hospital.  Thank you, Billy FischerJudy E. Sanyah Molnar, RN, MSN, CDE Inpatient Glycemic Control Team Team Pager [phone] (8am-5pm) 05/13/2016 11:03 AM

## 2016-05-13 NOTE — Procedures (Signed)
EEG Report  Clinical history:  Diabetic ketoacidosis with mental status changes  Technical Summary: A 19 channel digital EEG recording was performed using the 10-20 international system of electrode placement.   Bipolar and Referential montages were used.  The total recording time was about 20 minutes.    Description:  This is a low voltage recording.  Initially, the patient is mainly in a stage 2 sleep pattern.  She does wake up during the latter part where she has posterior dominant rhythms of maximum 7 Hz in frequency.  There is no focal slowing, epileptiform discharges, or electrographic seizures.  Impression:  This is an abnormal EEG due to mild generalized slowing of brain activity.  This is non-specific but likely related to a metabolic encephalopathy in this case.  There is no evidence of seizure tendency and the patient is not in non-convulsive status epilepticus.    Weston SettleShervin Marketa Midkiff, MS, MD

## 2016-05-13 NOTE — Plan of Care (Signed)
Problem: Activity: Goal: Ability to perform activities of daily living will improve Outcome: Progressing [facility]

## 2016-05-13 NOTE — Consult Note (Signed)
NEURO HOSPITALIST CONSULT NOTE   Requestig physician: Dr. Waymon Amato   Reason for Consult: Clenching teeth and thought to be seizure by primary team   HPI:                                                                                                                                          [name] is an 50 y.o. female presenting with acute encephalopathy as per renal's note likely due to DKA. Patient has end-stage renal disease secondary type 1 diabetes. On arrival to the emergency department urinalysis showed small amounts of leukocytes, elevated protein, greater than 80 ketones, elevated bilirubin, urine glucose greater than 1000. Patient was hyponatremic and remains hyponatremic with sodium of 126, hyperkalemic with a potassium of 6.0, hypochloremic with a chloride of 88, patient's glucose went from to 98 but has come down to 169. Patient has been placed on Rocephin? as her urine grew candida (fungal)-and fluconazole. Apparently when renal service was rounding patient was noted to be clenching her teeth thus neurology was consultative for possible seizure.  Currently patient is encephalopathic but localizes to pain in all 4 extremities and tells me to stop pinching her when I administered noxious stimuli. Currently being hooked up to EEG.  Past Medical History:  Diagnosis Date  . Arthritis    "elbows, knees, legs, back" (09/24/2015)  . Daily headache   . Depression    "years ago"  . End stage renal disease (HCC)    right arm AV graft, post transplant  . Hypertension   . Hypothyroid   . 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 Kidney disease   . Pneumonia ~ 2007?  [name] Type II diabetes mellitus (HCC)    Insulin dependant    Past Surgical History:  Procedure Laterality Date  . AMPUTATION Left 05/11/2014   Procedure: AMPUTATION LEFT GREAT TOE;  Surgeon: Kathryne Hitch, MD;  Location: [facility];  Service: Orthopedics;  Laterality:  Left;  . AV FISTULA PLACEMENT Right    forearm  . BACK SURGERY    . CATARACT EXTRACTION W/ INTRAOCULAR LENS  IMPLANT, BILATERAL Bilateral   . CESAREAN SECTION  07/1999  . DG AV DIALYSIS GRAFT DECLOT OR    . INCISION AND DRAINAGE Right 06/02/2015   Procedure: INCISION AND DRAINAGE OF RIGHT 3rd RAY RESECTION;  Surgeon: Kathryne Hitch, MD;  Location: [facility];  Service: Orthopedics;  Laterality: Right;  . 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 LUMBAR DISC SURGERY  2001  . NEPHRECTOMY TRANSPLANTED ORGAN    . TOE AMPUTATION Right    1,2 & 3rd toes.  . TUBAL LIGATION  07/1999    Family History  Problem Relation Age of Onset  . Emphysema Mother   .  Throat cancer Mother   . COPD Mother   . Cancer Mother   . Emphysema Father   . COPD Father   . Stroke Father   . ADD / ADHD Son      Social History:  reports that she quit smoking about 17 years ago. Her smoking use included Cigarettes. She has a 11.00 pack-year smoking history. She has never used smokeless tobacco. She reports that she does not drink alcohol or use drugs.  Allergies  Allergen Reactions  . Ace Inhibitors Cough  . Adhesive [Tape] Itching    Please use paper tape  . Lisinopril Cough    MEDICATIONS:                                                                                                                     Scheduled: . carvedilol  50 mg Oral BID WC  . cefTRIAXone (ROCEPHIN) IVPB 1 gram/50 mL D5W  1 g Intravenous Q0600  . Chlorhexidine Gluconate Cloth  6 each Topical Q0600  . fluconazole (DIFLUCAN) IV  200 mg Intravenous Q24H  . levothyroxine  137 mcg Oral QAC breakfast  . mupirocin ointment  1 application Nasal BID  . mycophenolate  500 mg Oral BID  . predniSONE  5 mg Oral Q breakfast  . tacrolimus  0.5 mg Oral QHS  . tacrolimus  1 mg Oral Daily     ROS:                                                                                                                                       History  obtained from unobtainable from patient due to mental status    Blood pressure 133/60, pulse 81, temperature 98.9 F (37.2 C), temperature source Oral, resp. rate 20, height 5\' 6"  (1.676 m), weight 87.3 kg (192 lb 7.4 oz), last menstrual period 11/19/2011, SpO2 95 %.   Neurologic Examination:  HEENT-  Normocephalic, no lesions, without obvious abnormality.  Normal external eye and conjunctiva.  Normal TM's bilaterally.  Normal auditory canals and external ears. Normal external nose, mucus membranes and septum.  Normal pharynx. Cardiovascular- S1, S2 normal, pulses palpable throughout   Lungs- chest clear, no wheezing, rales, normal symmetric air entry Abdomen- normal findings: bowel sounds normal Extremities- no edema Lymph-no adenopathy palpable Musculoskeletal-no joint tenderness, deformity or swelling Skin-warm and dry, no hyperpigmentation, vitiligo, or suspicious lesions  Neurological Examination Mental Status: Patient is very drowsy will only respond to noxious stimuli, however when she does respond she localizes appropriately with all 4 extremities and will even get to the point where she's tells me to stop. She will not open her eyes for me. Cranial Nerves: II: Patient does not blink to threat bilaterally., pupils equal, round, reactive to light and accommodation III,IV, VI: ptosis not present, doll's intact. V,VII: Face symmetric, winces to noxious stimuli VIII: hearing normal bilaterally XI: bilateral shoulder shrug XII: midline tongue extension Motor: Patient moves all extremities antigravity and localizes appropriately to pain with sternal rub and to add extremity noxious stimuli Sensory: Pinprick and light touch intact throughout, bilaterally, she does have significant decreased sensation from the mid calf to the foot likely secondary to polyneuropathy. Deep Tendon Reflexes:  1+ throughout the upper extremities no knee jerk or ankle jerk Plantars: Both great toes are amputated Cerebellar: Due to encephalopathy unable to follow commands Gait: Not tested     Lab Results: Basic Metabolic Panel:  Recent Labs Lab 05/12/16 2115 05/13/16 0030 05/13/16 0645 05/13/16 0843 05/13/16 1229  NA 126* 126* 134* 136 137  K 6.2* 6.0* 4.0 4.3 4.2  CL 91* 88* 101 107 111  CO2 8* <7* 11* 10* 17*  GLUCOSE 702* 744* 298* 295* 169*  BUN 33* 37* 35* 34* 31*  CREATININE 2.30* 2.50* 2.09* 2.07* 1.60*  CALCIUM 10.4* 9.8 9.0 9.3 9.4    Liver Function Tests: No results for input(s): AST, ALT, ALKPHOS, BILITOT, PROT, ALBUMIN in the last 168 hours. No results for input(s): LIPASE, AMYLASE in the last 168 hours. No results for input(s): AMMONIA in the last 168 hours.  CBC:  Recent Labs Lab 05/12/16 2115  WBC 15.7*  HGB 11.6*  HCT 38.9  MCV 92.2  PLT 359    Cardiac Enzymes:  Recent Labs Lab 05/13/16 0030 05/13/16 0645 05/13/16 1229  TROPONINI 0.03* 0.04* 0.08*    Lipid Panel: No results for input(s): CHOL, TRIG, HDL, CHOLHDL, VLDL, LDLCALC in the last 168 hours.  CBG:  Recent Labs Lab 05/13/16 1028 05/13/16 1132 05/13/16 1241 05/13/16 1344 05/13/16 1455  GLUCAP 150* 164* 151* 169* 164*    Microbiology: Results for orders placed or performed during the hospital encounter of 05/12/16  MRSA PCR Screening     Status: Abnormal   Collection Time: 05/13/16  3:15 AM  Result Value Ref Range Status   MRSA by PCR POSITIVE (A) NEGATIVE Final    Comment:        The GeneXpert MRSA Assay (FDA approved for NASAL specimens only), is one component of a comprehensive MRSA colonization surveillance program. It is not intended to diagnose MRSA infection nor to guide or monitor treatment for MRSA infections. RESULT CALLED TO, READ BACK BY AND VERIFIED WITH: A KORSLIEN,RN @0610  05/13/16 MKELLY     Coagulation Studies: No results for input(s): LABPROT,  INR in the last 72 hours.  Imaging: Ct Head Wo Contrast  Result Date: 05/13/2016 CLINICAL DATA:  Confusion.  Altered mental status. Diabetic ketoacidosis. EXAM: CT HEAD WITHOUT CONTRAST TECHNIQUE: Contiguous axial images were obtained from the base of the skull through the vertex without intravenous contrast. COMPARISON:  Brain MRI 04/10/2016 FINDINGS: Brain: No mass lesion, intraparenchymal hemorrhage or extra-axial collection. No evidence of acute cortical infarct. Brain parenchyma and CSF-containing spaces are normal for age. Vascular: No hyperdense vessel or atherosclerotic calcification. Skull: Normal visualized skull base, calvarium and extracranial soft tissues. Sinuses/Orbits: Complete opacification of the maxillary sinuses, left frontal sinus and left sphenoid sinus with near complete opacification of the bilateral ethmoid sinuses. Normal orbits. IMPRESSION: 1. No acute intracranial abnormality. 2. Findings of chronic sinusitis including complete opacification of the left frontal, left sphenoid and bilateral maxillary sinuses. Electronically Signed   By: Deatra Robinson M.D.   On: 05/13/2016 02:16       Assessment and plan per attending neurologist  Felicie Morn PA-C [facility] [phone]  05/13/2016, 3:36 PM   Assessment/Plan:  This is a 50 year old female brought to the hospital due to encephalopathy secondary to DKA. Apparently one moment during the day she was noted to have increased tone in clenching her teeth. Currently she remains encephalopathic but appropriately localizes to pain. No clinical seizure activity is noted at this time. Currently she has multiple metabolic issues. It should be noted that although metabolic issues may be resolved it still can take a few days to weeks of the brain to normalize. We will obtain EEG to evaluate for possible irritability or seizure activity.  At this time would not initiate antiepileptic medication unless EEG shows reason to.   If EEG is within normal limits no further neurological intervention is recommended.

## 2016-05-13 NOTE — Progress Notes (Signed)
PROGRESS NOTE  [name]  ZOX:[id number] DOB: [birth year]  DOA: 05/12/2016 PCP: Dois Davenport., MD   Brief Narrative:  50 year old female with a PMH of ESRD s/p renal transplant 2011 and on immunosuppressants, HTN, hypothyroid, poorly controlled/? Labile DM 2/IDDM, recent hospitalization 8/4-8/13 for hypoglycemia and related AMS (prior hospitalization 03/31/16 for DKA), sustained a fall at home 3 days prior to admission, bruises and a laceration over right leg which was sutured in ED and discharged home on Keflex, presented to [facility] ED on 05/12/16 with confusion. She had not been taking her medications because she was sleepy. In the ED, glucose 704, sodium 126, potassium 6.2, creatinine 2.3, bicarbonate 8, anion gap 27. Admitted to stepdown unit for management of DKA and acute kidney injury. Started on IV insulin drip per DKA protocol.   Assessment & Plan:   Active Problems:   H/O kidney transplant   Sepsis (HCC)   Essential hypertension   DM (diabetes mellitus) type 2, uncontrolled, with ketoacidosis (HCC)   Acute renal failure superimposed on stage 3 chronic kidney disease (HCC)   History of renal transplant   DKA (diabetic ketoacidoses) (HCC)   Acute encephalopathy   UTI (lower urinary tract infection)   QT prolongation   DKA in poorly controlled type II DM/IDDM/? Brittle diabetes - Prior hospitalizations for DKA and hypoglycemia. Issues with noncompliance. - Apparently missed medications for a couple of days since she was sleepy. - Presented with blood glucose of 704 and DKA - Admitted to stepdown unit and started on DKA protocol with aggressive IV fluid hydration and insulin drip. - CBGs have improved but bicarbonate still 10 and anion gap 19. As discussed with nephrology, starting bicarbonate drip in addition to D5 1/2 NS. - Continue to monitor BMP closely and transition to home dose of insulin's after bicarbonate has normalized and anion gap has closed.  Acute on stage III  chronic kidney disease in transplanted kidney - Baseline creatinine 1.2. Presented with creatinine of 2.3. Secondary to dehydration. IV fluids and follow BMP. Improving. - Nephrology consulted. - Making urine. Discussed with RN regarding strict intake and output monitoring.  Funguria/Candiduria/? UTI - As discussed with nephrology, given her immunocompromised state from immunosuppressants, treat with antifungals. - Discussed with pharmacy: Recommend fluconazole 200 MG daily. Monitor EKG closely for QTC. - Patient also on IV Rocephin, continue for now but consider discontinuing if cultures are negative.  - Not convinced that she has sepsis.  Acute encephalopathy - Possibly from DKA and acute kidney injury. CT head without acute findings. Treat as above and monitor closely.  Essential hypertension - Continue metoprolol. Mildly uncontrolled.  Prolonged QTC - Repeat QTC: 495 ms. Continue monitoring on telemetry. Potassium 4.3. Minimize precipitating medications as much as possible.  Status post renal transplant  - Continue antirejection medications  Anemia -Follow CBCs.    Chronic sinusitis - Seen on CT. Consider outpatient ENT consultation    DVT prophylaxis: SCD's Code Status: Full Family Communication: None at bedside Disposition Plan: Admitted to SDU. Not medically stable for DC.    Consultants:   Nephrology  Procedures:   None  Antimicrobials:   IV Rocephin 9/6>  IV Fluconazole 9/6>     Subjective: Seen this morning, lethargic, somnolent, arousable and oriented 2. Unable to obtain any history from patient. As per RN, no acute issues.   Objective:  Vitals:   05/13/16 0500 05/13/16 0700 05/13/16 0800 05/13/16 1100  BP:  (!) 147/66 (!) 147/64 (!) 150/58  Pulse:  (!) 104 [name])  105 100  Resp:  (!) 22 (!) 21 (!) 25  Temp:   98.2 F (36.8 C) 98.9 F (37.2 C)  TempSrc:   Oral Oral  SpO2:  98% 99% 99%  Weight: 87.3 kg (192 lb 7.4 oz)     Height: 5\' 6"  (1.676  m)       Intake/Output Summary (Last 24 hours) at 05/13/16 1317 Last data filed at 05/13/16 1300  Gross per 24 hour  Intake          1812.17 ml  Output              175 ml  Net          1637.17 ml   Filed Weights   05/13/16 0500  Weight: 87.3 kg (192 lb 7.4 oz)    Examination:  General exam: Pleasant young female lying comfortably supine in bed. Oral mucosa dry. Respiratory system: Clear to auscultation. Respiratory effort normal. Cardiovascular system: S1 & S2 heard, RRR. No JVD, murmurs, rubs, gallops or clicks. No pedal edema. telemetry: Sinus tachycardia in the 100s/110s,? BBB morphology.  Gastrointestinal system: Abdomen is nondistended, soft and nontender. No organomegaly or masses felt. Normal bowel sounds heard. Central nervous system: somnolent but easily arousable and oriented to person and place. No focal neurological deficits. neck supple  Extremities: moving all limbs symmetrically well. Multiple scabs on lower extremities and recently sutured right mid lateral leg laceration with some small hematoma but no other acute findings.  Skin: No rashes, lesions or ulcers Psychiatry: Judgement and insight appear impaired. Mood & affect cannot be assessed.     Data Reviewed: I have personally reviewed following labs and imaging studies  CBC:  Recent Labs Lab 05/12/16 2115  WBC 15.7*  HGB 11.6*  HCT 38.9  MCV 92.2  PLT 359   Basic Metabolic Panel:  Recent Labs Lab 05/12/16 2115 05/13/16 0030 05/13/16 0645 05/13/16 0843  NA 126* 126* 134* 136  K 6.2* 6.0* 4.0 4.3  CL 91* 88* 101 107  CO2 8* <7* 11* 10*  GLUCOSE 702* 744* 298* 295*  BUN 33* 37* 35* 34*  CREATININE 2.30* 2.50* 2.09* 2.07*  CALCIUM 10.4* 9.8 9.0 9.3   GFR: Estimated Creatinine Clearance: 36.6 mL/min (by C-G formula based on SCr of 2.07 mg/dL). Liver Function Tests: No results for input(s): AST, ALT, ALKPHOS, BILITOT, PROT, ALBUMIN in the last 168 hours. No results for input(s): LIPASE,  AMYLASE in the last 168 hours. No results for input(s): AMMONIA in the last 168 hours. Coagulation Profile: No results for input(s): INR, PROTIME in the last 168 hours. Cardiac Enzymes:  Recent Labs Lab 05/13/16 0030 05/13/16 0645  TROPONINI 0.03* 0.04*   BNP (last 3 results) No results for input(s): PROBNP in the last 8760 hours. HbA1C: No results for input(s): HGBA1C in the last 72 hours. CBG:  Recent Labs Lab 05/13/16 0816 05/13/16 0923 05/13/16 1028 05/13/16 1132 05/13/16 1241  GLUCAP 212* 174* 150* 164* 151*   Lipid Profile: No results for input(s): CHOL, HDL, LDLCALC, TRIG, CHOLHDL, LDLDIRECT in the last 72 hours. Thyroid Function Tests: No results for input(s): TSH, T4TOTAL, FREET4, T3FREE, THYROIDAB in the last 72 hours. Anemia Panel: No results for input(s): VITAMINB12, FOLATE, FERRITIN, TIBC, IRON, RETICCTPCT in the last 72 hours.  Sepsis Labs:  Recent Labs Lab 05/13/16 0032 05/13/16 0731  LATICACIDVEN 2.7* 1.7    Recent Results (from the past 240 hour(s))  MRSA PCR Screening     Status: Abnormal   Collection  Time: 05/13/16  3:15 AM  Result Value Ref Range Status   MRSA by PCR POSITIVE (A) NEGATIVE Final    Comment:        The GeneXpert MRSA Assay (FDA approved for NASAL specimens only), is one component of a comprehensive MRSA colonization surveillance program. It is not intended to diagnose MRSA infection nor to guide or monitor treatment for MRSA infections. RESULT CALLED TO, READ BACK BY AND VERIFIED WITH: A KORSLIEN,RN @0610  05/13/16 [facility]          Radiology Studies: Ct Head Wo Contrast  Result Date: 05/13/2016 CLINICAL DATA:  Confusion. Altered mental status. Diabetic ketoacidosis. EXAM: CT HEAD WITHOUT CONTRAST TECHNIQUE: Contiguous axial images were obtained from the base of the skull through the vertex without intravenous contrast. COMPARISON:  Brain MRI 04/10/2016 FINDINGS: Brain: No mass lesion, intraparenchymal hemorrhage or  extra-axial collection. No evidence of acute cortical infarct. Brain parenchyma and CSF-containing spaces are normal for age. Vascular: No hyperdense vessel or atherosclerotic calcification. Skull: Normal visualized skull base, calvarium and extracranial soft tissues. Sinuses/Orbits: Complete opacification of the maxillary sinuses, left frontal sinus and left sphenoid sinus with near complete opacification of the bilateral ethmoid sinuses. Normal orbits. IMPRESSION: 1. No acute intracranial abnormality. 2. Findings of chronic sinusitis including complete opacification of the left frontal, left sphenoid and bilateral maxillary sinuses. Electronically Signed   By: Deatra RobinsonKevin  Herman M.D.   On: 05/13/2016 02:16        Scheduled Meds: . carvedilol  50 mg Oral BID WC  . cefTRIAXone (ROCEPHIN) IVPB 1 gram/50 mL D5W  1 g Intravenous Q0600  . Chlorhexidine Gluconate Cloth  6 each Topical Q0600  . levothyroxine  137 mcg Oral QAC breakfast  . mupirocin ointment  1 application Nasal BID  . mycophenolate  500 mg Oral BID  . predniSONE  5 mg Oral Q breakfast  . tacrolimus  0.5 mg Oral QHS  . tacrolimus  1 mg Oral Daily   Continuous Infusions: . sodium chloride Stopped (05/13/16 0840)  . dextrose 5 % and 0.45% NaCl 150 mL/hr at 05/13/16 0840  . insulin (NOVOLIN-R) infusion 2.7 Units/hr (05/13/16 1243)  .  sodium bicarbonate 150 mEq in sterile water 1000 mL infusion       LOS: 0 days    Time spent: 45 minutes     Mikaeel Petrow, MD [facility] Pager [phone]  If 7PM-7AM, please contact night-coverage [email or web address] Password [email or web address] 05/13/2016, 1:17 PM

## 2016-05-14 DIAGNOSIS — N183 Chronic kidney disease, stage 3 (moderate): Secondary | ICD-10-CM

## 2016-05-14 DIAGNOSIS — I10 Essential (primary) hypertension: Secondary | ICD-10-CM

## 2016-05-14 DIAGNOSIS — E1310 Other specified diabetes mellitus with ketoacidosis without coma: Secondary | ICD-10-CM

## 2016-05-14 DIAGNOSIS — N390 Urinary tract infection, site not specified: Secondary | ICD-10-CM

## 2016-05-14 DIAGNOSIS — N179 Acute kidney failure, unspecified: Secondary | ICD-10-CM

## 2016-05-14 DIAGNOSIS — Z940 Kidney transplant status: Secondary | ICD-10-CM

## 2016-05-14 LAB — BASIC METABOLIC PANEL
ANION GAP: 7 (ref 5–15)
Anion gap: 8 (ref 5–15)
BUN: 24 mg/dL — AB (ref 6–20)
BUN: 19 mg/dL (ref 6–20)
CALCIUM: 9.6 mg/dL (ref 8.9–10.3)
CALCIUM: 9.6 mg/dL (ref 8.9–10.3)
CHLORIDE: 105 mmol/L (ref 101–111)
CO2: 22 mmol/L (ref 22–32)
CO2: 24 mmol/L (ref 22–32)
CREATININE: 1.24 mg/dL — AB (ref 0.44–1.00)
Chloride: 105 mmol/L (ref 101–111)
Creatinine, Ser: 1.11 mg/dL — ABNORMAL HIGH (ref 0.44–1.00)
GFR calc Af Amer: >60 mL/min (ref >60)
GFR calc non Af Amer: 50 mL/min — ABNORMAL LOW (ref >60)
GFR calc non Af Amer: 57 mL/min — ABNORMAL LOW (ref >60)
GFR, EST AFRICAN AMERICAN: 58 mL/min — AB (ref >60)
GLUCOSE: 123 mg/dL — AB (ref 65–99)
Glucose, Bld: 184 mg/dL — ABNORMAL HIGH (ref 65–99)
POTASSIUM: 3.1 mmol/L — AB (ref 3.5–5.1)
Potassium: 3.4 mmol/L — ABNORMAL LOW (ref 3.5–5.1)
SODIUM: 135 mmol/L (ref 135–145)
Sodium: 136 mmol/L (ref 135–145)

## 2016-05-14 LAB — GLUCOSE, CAPILLARY
GLUCOSE-CAPILLARY: 135 mg/dL — AB (ref 65–99)
GLUCOSE-CAPILLARY: 164 mg/dL — AB (ref 65–99)
GLUCOSE-CAPILLARY: 166 mg/dL — AB (ref 65–99)
GLUCOSE-CAPILLARY: 175 mg/dL — AB (ref 65–99)
Glucose-Capillary: 204 mg/dL — ABNORMAL HIGH (ref 65–99)
Glucose-Capillary: 190 mg/dL — ABNORMAL HIGH (ref 65–99)
Glucose-Capillary: 148 mg/dL — ABNORMAL HIGH (ref 65–99)
Glucose-Capillary: 121 mg/dL — ABNORMAL HIGH (ref 65–99)
Glucose-Capillary: 165 mg/dL — ABNORMAL HIGH (ref 65–99)
Glucose-Capillary: 212 mg/dL — ABNORMAL HIGH (ref 65–99)
Glucose-Capillary: 185 mg/dL — ABNORMAL HIGH (ref 65–99)
Glucose-Capillary: 161 mg/dL — ABNORMAL HIGH (ref 65–99)

## 2016-05-14 LAB — CBC
HEMATOCRIT: 30.8 % — AB (ref 36.0–46.0)
HEMOGLOBIN: 9.8 g/dL — AB (ref 12.0–15.0)
MCH: 27.5 pg (ref 26.0–34.0)
MCHC: 31.8 g/dL (ref 30.0–36.0)
MCV: 86.3 fL (ref 78.0–100.0)
Platelets: 216 10*3/uL (ref 150–400)
RBC: 3.57 MIL/uL — ABNORMAL LOW (ref 3.87–5.11)
RDW: 14.4 % (ref 11.5–15.5)
WBC: 7.7 10*3/uL (ref 4.0–10.5)

## 2016-05-14 LAB — HEMOGLOBIN A1C
Hgb A1c MFr Bld: 10.3 % — ABNORMAL HIGH (ref 4.8–5.6)
MEAN PLASMA GLUCOSE: 249 mg/dL

## 2016-05-14 LAB — MAGNESIUM: Magnesium: 1.4 mg/dL — ABNORMAL LOW (ref 1.7–2.4)

## 2016-05-14 MED ORDER — POTASSIUM CHLORIDE CRYS ER 20 MEQ PO TBCR
40.0000 meq | EXTENDED_RELEASE_TABLET | Freq: Once | ORAL | Status: AC
  Administered 2016-05-14: 40 meq via ORAL
  Filled 2016-05-14: qty 2

## 2016-05-14 MED ORDER — INSULIN ASPART PROT & ASPART (70-30 MIX) 100 UNIT/ML ~~LOC~~ SUSP
18.0000 [IU] | Freq: Two times a day (BID) | SUBCUTANEOUS | Status: DC
  Administered 2016-05-14 (×2): 18 [IU] via SUBCUTANEOUS
  Filled 2016-05-14: qty 10

## 2016-05-14 MED ORDER — SODIUM CHLORIDE 0.9 % IV SOLN
INTRAVENOUS | Status: DC
  Administered 2016-05-14 – 2016-05-15 (×2): via INTRAVENOUS

## 2016-05-14 MED ORDER — INSULIN ASPART 100 UNIT/ML ~~LOC~~ SOLN
0.0000 [IU] | Freq: Three times a day (TID) | SUBCUTANEOUS | Status: DC
  Administered 2016-05-14 (×2): 3 [IU] via SUBCUTANEOUS

## 2016-05-14 MED ORDER — ACETAMINOPHEN 325 MG PO TABS: 650.0000 mg | ORAL_TABLET | ORAL | Status: DC | PRN

## 2016-05-14 MED ORDER — AMLODIPINE BESYLATE 5 MG PO TABS
5.0000 mg | ORAL_TABLET | Freq: Every day | ORAL | Status: DC
  Administered 2016-05-14 – 2016-05-15 (×2): 5 mg via ORAL
  Filled 2016-05-14 (×2): qty 1

## 2016-05-14 MED ORDER — FLUCONAZOLE 200 MG PO TABS
200.0000 mg | ORAL_TABLET | Freq: Every day | ORAL | Status: DC
  Administered 2016-05-14: 200 mg via ORAL
  Filled 2016-05-14 (×2): qty 1

## 2016-05-14 MED ORDER — LIVING WELL WITH DIABETES BOOK
Freq: Once | Status: AC
  Administered 2016-05-14: 17:00:00
  Filled 2016-05-14: qty 1

## 2016-05-14 MED ORDER — INSULIN ASPART 100 UNIT/ML ~~LOC~~ SOLN: 0.0000 [IU] | Freq: Every day | SUBCUTANEOUS | Status: DC

## 2016-05-14 NOTE — Evaluation (Signed)
Physical Therapy Evaluation Patient Details Name: [name]  MRN: [id number] DOB: Oct 29, 1965 Today'[name] Date: 05/14/2016   History of Present Illness  50 year old female with a PMH of ESRD [name]/p renal transplant 2011 and on immunosuppressants, HTN, hypothyroid, poorly controlled/? Labile DM 2/IDDM, recent hospitalization 8/4-8/13 for hypoglycemia and related AMS (prior hospitalization 03/31/16 for DKA), sustained a fall at home 3 days prior to admission, bruises and a laceration over right leg which was sutured in ED and discharged home on Keflex, [facility] on 05/12/16 with confusion. She had not been taking her medications because she was sleepy. In the ED, glucose 704, sodium 126, potassium 6.2, creatinine 2.3, bicarbonate 8, anion gap 27. Admitted to stepdown unit for management of DKA and acute kidney injury. Started on IV insulin drip per DKA protocol. DKA resolved and then transitioned to 70/30 insulin and SSI on 9/7. Mental status changes have resolved.  Clinical Impression  Pt admitted with above diagnosis. Pt currently with functional limitations due to the deficits listed below (see PT Problem List). Pt was able to pivot to chair but was unsteady. Feel that pt will benefit from short term SNF as pt is home alone and is weak at present.  Pt agrees.  Pt will benefit from skilled PT to increase their independence and safety with mobility to allow discharge to the venue listed below.      Follow Up Recommendations SNF;Supervision/Assistance - 24 hour    Equipment Recommendations  Other (comment) (TBA)    Recommendations for Other Services       Precautions / Restrictions Precautions Precautions: Fall Restrictions Weight Bearing Restrictions: No      Mobility  Bed Mobility Overal bed mobility: Needs Assistance Bed Mobility: Supine to Sit     Supine to sit: Min assist     General bed mobility comments: Assist for LES and elevation of trunk.  Transfers Overall transfer  level: Needs assistance Equipment used: 2 person hand held assist Transfers: Sit to/from UGI CorporationStand;Stand Pivot Transfers Sit to Stand: Min assist Stand pivot transfers: Min assist       General transfer comment: Assisted pt minimally with pivot transfer as pt slightly unsure on feet.Pt needed bil UE support for stability.   Ambulation/Gait                Stairs            Wheelchair Mobility    Modified Rankin (Stroke Patients Only)       Balance Overall balance assessment: Needs assistance Sitting-balance support: No upper extremity supported;Feet supported Sitting balance-Leahy Scale: Fair     Standing balance support: Bilateral upper extremity supported;During functional activity Standing balance-Leahy Scale: Poor Standing balance comment: required bil UE support for balance.                              Pertinent Vitals/Pain Pain Assessment: No/denies pain  VSS    Home Living Family/patient expects to be discharged to:: Private residence Living Arrangements: Spouse/significant other;Children Available Help at Discharge: Family;Available PRN/intermittently (son in school and husband works) Type of Home: House Home Access: Stairs to enter Entrance Stairs-Rails: Right;Can reach Technical sales engineerboth;Left Entrance Stairs-Number of Steps: 4 Home Layout: One level Home Equipment: Shower seat;Cane - single point;Walker - 4 wheels;Bedside commode      Prior Function Level of Independence: Independent with assistive device([name])               Hand Dominance  Dominant Hand: Right    Extremity/Trunk Assessment   Upper Extremity Assessment: Defer to OT evaluation           Lower Extremity Assessment: Generalized weakness      Cervical / Trunk Assessment: Normal  Communication   Communication: No difficulties  Cognition Arousal/Alertness: Awake/alert Behavior During Therapy: WFL for tasks assessed/performed Overall Cognitive Status: Within  Functional Limits for tasks assessed                      General Comments      Exercises General Exercises - Lower Extremity Long Arc Quad: AROM;Both;10 reps;Seated      Assessment/Plan    PT Assessment Patient needs continued PT services  PT Diagnosis Generalized weakness   PT Problem List Decreased activity tolerance;Decreased balance;Decreased mobility;Decreased knowledge of use of DME;Decreased safety awareness;Decreased knowledge of precautions  PT Treatment Interventions DME instruction;Functional mobility training;Therapeutic activities;Balance training;Therapeutic exercise;Stair training;Gait training;Patient/family education   PT Goals (Current goals can be found in the Care Plan section) Acute Rehab PT Goals Patient Stated Goal: to get better PT Goal Formulation: With patient Time For Goal Achievement: 05/28/16 Potential to Achieve Goals: Good    Frequency Min 3X/week   Barriers to discharge Decreased caregiver support      Co-evaluation               End of Session Equipment Utilized During Treatment: Gait belt;Oxygen Activity Tolerance: Patient limited by fatigue Patient left: in chair;with call bell/phone within reach Nurse Communication: Mobility status         Time: 1610-9604 PT Time Calculation (min) (ACUTE ONLY): 13 min   Charges:   PT Evaluation $PT Eval Moderate Complexity: 1 Procedure     PT G CodesTawni Millers [name] 2016-05-27, 3:00 PM [name],PT Acute Rehabilitation [phone] (pager)

## 2016-05-14 NOTE — Progress Notes (Signed)
PROGRESS NOTE  [name]  ZOX:[id number] DOB: 08/20/1966  DOA: 05/12/2016 PCP: Dois Davenport., MD   Brief Narrative:  50 year old female with a PMH of ESRD s/p renal transplant 2011 and on immunosuppressants, HTN, hypothyroid, poorly controlled/? Labile DM 2/IDDM, recent hospitalization 8/4-8/13 for hypoglycemia and related AMS (prior hospitalization 03/31/16 for DKA), sustained a fall at home 3 days prior to admission, bruises and a laceration over right leg which was sutured in ED and discharged home on Keflex, presented to [facility] ED on 05/12/16 with confusion. She had not been taking her medications because she was sleepy. In the ED, glucose 704, sodium 126, potassium 6.2, creatinine 2.3, bicarbonate 8, anion gap 27. Admitted to stepdown unit for management of DKA and acute kidney injury. Started on IV insulin drip per DKA protocol. DKA resolved and then transitioned to 70/30 insulin and SSI on 9/7. Mental status changes have resolved.   Assessment & Plan:   Active Problems:   H/O kidney transplant   Sepsis (HCC)   Essential hypertension   DM (diabetes mellitus) type 2, uncontrolled, with ketoacidosis (HCC)   Acute renal failure superimposed on stage 3 chronic kidney disease (HCC)   History of renal transplant   DKA (diabetic ketoacidoses) (HCC)   Acute encephalopathy   UTI (lower urinary tract infection)   QT prolongation   DKA in poorly controlled type II DM/IDDM/? Brittle diabetes - Prior hospitalizations for DKA and hypoglycemia. Issues with noncompliance. - Apparently missed medications for a couple of days since she was sleepy. - Presented with blood glucose of 704 and DKA - Admitted to stepdown unit and started on DKA protocol with aggressive IV fluid hydration and insulin drip. - DKA resolved and patient transitioned to slightly reduced dose of home 70/30 insulin and moderate sensitivity SSI. - Hemoglobin A1c: 10.3. Has been consistently greater than 10 for past several  months.  - Patient counseled extensively regarding compliance with medications.  Acute on stage III chronic kidney disease in transplanted kidney - Presented with creatinine of 2.3. Secondary to dehydration. IV fluids and follow BMP. Improving. - Nephrology consultation appreciated. Creatinine has returned to baseline/1.1. Continue antirejection medications: Tacrolimus, MPM) and prednisone. Followed at transplant clinic at [facility].  Hypokalemia - Replace and follow  Funguria/Candiduria/? UTI - As discussed with nephrology 9/6, given her immunocompromised state from immunosuppressants, treating with antifungals. - Discussed with pharmacy: Recommend fluconazole 200 MG daily. Monitor EKG closely for QTC. - Patient also on IV Rocephin, continue for now but consider discontinuing if cultures are negative.  - Not convinced that she has sepsis. - Unfortunately urine culture was not sent on admission. We'll try to add on to prior sample.  Acute encephalopathy - Possibly from DKA and acute kidney injury. CT head without acute findings.  - When nephrology saw her on 05/13/16, she was noted to be clenching her teeth and had a rigid muscle tone and there was suspicion for seizure like activity. She had received a dose of Ativan earlier in the morning on 9/6. - Neurology was consulted. EEG negative. As per discussion with nephrology who know her well, she is back to her baseline mental status.  Essential hypertension - Continue metoprolol. Mildly uncontrolled.  Prolonged QTC - Repeat QTC: 495 ms. Continue monitoring on telemetry. Potassium 4.3. Minimize precipitating medications as much as possible.  Status post renal transplant  - Continue antirejection medications  Anemia - Hemoglobin has dropped about 2 g in the absence of overt bleeding. Possibly dilutional  from IV fluids. Follow CBC in a.m.  Chronic sinusitis - Seen on CT. Consider outpatient ENT  consultation    DVT prophylaxis: SCD's Code Status: Full Family Communication: left voicemail message for spouse to call back to discuss. Disposition Plan: Admitted to SDU. Not medically ready for DC. Possible DC 05/15/16    Consultants:   Nephrology  Neurology  Procedures:   None  EEG 05/13/16: Impression:  This is an abnormal EEG due to mild generalized slowing of brain activity.  This is non-specific but likely related to a metabolic encephalopathy in this case.  There is no evidence of seizure tendency and the patient is not in non-convulsive status epilepticus.    Antimicrobials:   IV Rocephin 9/6>  IV Fluconazole 9/6>     Subjective: Feels much better. Denies complaints. As per RN, mental status has significantly improved and no acute issues reported. As per nephrologist, mental status back to baseline.   Objective:  Vitals:   05/14/16 0500 05/14/16 0600 05/14/16 0700 05/14/16 1000  BP: (!) 147/59 (!) 145/60 (!) 152/65 (!) 166/65  Pulse: 83 82 84 88  Resp: (!) 21 (!) 21 19 (!) 28  Temp:   98.2 F (36.8 C)   TempSrc:   Oral   SpO2: 96% 97% 100% 95%  Weight:      Height:        Intake/Output Summary (Last 24 hours) at 05/14/16 1132 Last data filed at 05/14/16 1010  Gross per 24 hour  Intake          4843.81 ml  Output             1350 ml  Net          3493.81 ml   Filed Weights   05/13/16 0500  Weight: 87.3 kg (192 lb 7.4 oz)    Examination:  General exam: Pleasant young female lying comfortably supine in bed. Oral mucosa Moist.  Respiratory system: Clear to auscultation. Respiratory effort normal. Cardiovascular system: S1 & S2 heard, RRR. No JVD, murmurs, rubs, gallops or clicks. No pedal edema. telemetry: Sinus rhythm..  Gastrointestinal system: Abdomen is nondistended, soft and nontender. No organomegaly or masses felt. Normal bowel sounds heard. Central nervous system: Alert and oriented to person, place and partly to time. No focal neurological  deficits. Neck supple  Extremities: moving all limbs symmetrically well. Multiple scabs on lower extremities and recently sutured right mid lateral leg laceration with some small hematoma but no other acute findings.  Skin: No rashes, lesions or ulcers Psychiatry: Judgement and insight appear impaired. Mood & affect appropriate .     Data Reviewed: I have personally reviewed following labs and imaging studies  CBC:  Recent Labs Lab 05/12/16 2115 05/14/16 0547  WBC 15.7* 7.7  HGB 11.6* 9.8*  HCT 38.9 30.8*  MCV 92.2 86.3  PLT 359 216   Basic Metabolic Panel:  Recent Labs Lab 05/13/16 1229 05/13/16 1637 05/13/16 2028 05/14/16 0040 05/14/16 0547  NA 137 136 135 135 136  K 4.2 3.9 4.0 3.4* 3.1*  CL 111 110 109 105 105  CO2 17* 19* 17* 22 24  GLUCOSE 169* 154* 173* 184* 123*  BUN 31* 28* 27* 24* 19  CREATININE 1.60* 1.46* 1.33* 1.24* 1.11*  CALCIUM 9.4 9.4 9.3 9.6 9.6   GFR: Estimated Creatinine Clearance: 68.2 mL/min (by C-G formula based on SCr of 1.11 mg/dL). Liver Function Tests: No results for input(s): AST, ALT, ALKPHOS, BILITOT, PROT, ALBUMIN in the last 168 hours.  No results for input(s): LIPASE, AMYLASE in the last 168 hours. No results for input(s): AMMONIA in the last 168 hours. Coagulation Profile: No results for input(s): INR, PROTIME in the last 168 hours. Cardiac Enzymes:  Recent Labs Lab 05/13/16 0030 05/13/16 0645 05/13/16 1229  TROPONINI 0.03* 0.04* 0.08*   BNP (last 3 results) No results for input(s): PROBNP in the last 8760 hours. HbA1C:  Recent Labs  05/13/16 0645  HGBA1C 10.3*   CBG:  Recent Labs Lab 05/14/16 0501 05/14/16 0620 05/14/16 0730 05/14/16 0838 05/14/16 0945  GLUCAP 148* 121* 135* 165* 212*   Lipid Profile: No results for input(s): CHOL, HDL, LDLCALC, TRIG, CHOLHDL, LDLDIRECT in the last 72 hours. Thyroid Function Tests: No results for input(s): TSH, T4TOTAL, FREET4, T3FREE, THYROIDAB in the last 72  hours. Anemia Panel: No results for input(s): VITAMINB12, FOLATE, FERRITIN, TIBC, IRON, RETICCTPCT in the last 72 hours.  Sepsis Labs:  Recent Labs Lab 05/13/16 0032 05/13/16 0731 05/13/16 1229  LATICACIDVEN 2.7* 1.7 0.9    Recent Results (from the past 240 hour(s))  MRSA PCR Screening     Status: Abnormal   Collection Time: 05/13/16  3:15 AM  Result Value Ref Range Status   MRSA by PCR POSITIVE (A) NEGATIVE Final    Comment:        The GeneXpert MRSA Assay (FDA approved for NASAL specimens only), is one component of a comprehensive MRSA colonization surveillance program. It is not intended to diagnose MRSA infection nor to guide or monitor treatment for MRSA infections. RESULT CALLED TO, READ BACK BY AND VERIFIED WITH: A KORSLIEN,RN @0610  05/13/16 [facility]          Radiology Studies: Ct Head Wo Contrast  Result Date: 05/13/2016 CLINICAL DATA:  Confusion. Altered mental status. Diabetic ketoacidosis. EXAM: CT HEAD WITHOUT CONTRAST TECHNIQUE: Contiguous axial images were obtained from the base of the skull through the vertex without intravenous contrast. COMPARISON:  Brain MRI 04/10/2016 FINDINGS: Brain: No mass lesion, intraparenchymal hemorrhage or extra-axial collection. No evidence of acute cortical infarct. Brain parenchyma and CSF-containing spaces are normal for age. Vascular: No hyperdense vessel or atherosclerotic calcification. Skull: Normal visualized skull base, calvarium and extracranial soft tissues. Sinuses/Orbits: Complete opacification of the maxillary sinuses, left frontal sinus and left sphenoid sinus with near complete opacification of the bilateral ethmoid sinuses. Normal orbits. IMPRESSION: 1. No acute intracranial abnormality. 2. Findings of chronic sinusitis including complete opacification of the left frontal, left sphenoid and bilateral maxillary sinuses. Electronically Signed   By: Deatra Robinson M.D.   On: 05/13/2016 02:16        Scheduled  Meds: . amLODipine  5 mg Oral Daily  . carvedilol  50 mg Oral BID WC  . cefTRIAXone (ROCEPHIN) IVPB 1 gram/50 mL D5W  1 g Intravenous Q0600  . Chlorhexidine Gluconate Cloth  6 each Topical Q0600  . fluconazole (DIFLUCAN) IV  200 mg Intravenous Q24H  . insulin aspart  0-15 Units Subcutaneous TID WC  . insulin aspart  0-5 Units Subcutaneous QHS  . insulin aspart protamine- aspart  18 Units Subcutaneous BID WC  . levothyroxine  137 mcg Oral QAC breakfast  . mupirocin ointment  1 application Nasal BID  . mycophenolate  500 mg Oral BID  . predniSONE  5 mg Oral Q breakfast  . tacrolimus  0.5 mg Oral QHS  . tacrolimus  1 mg Oral Daily   Continuous Infusions: . sodium chloride Stopped (05/13/16 0840)  . sodium chloride 50 mL/hr at 05/14/16 1010  .  dextrose 5 % and 0.45% NaCl Stopped (05/14/16 1007)  . insulin (NOVOLIN-R) infusion Stopped (05/14/16 1010)     LOS: 1 day    Time spent: 25 minutes     Jeremi Losito, MD [facility] Pager [phone]  If 7PM-7AM, please contact night-coverage [email or web address] Password [email or web address] 05/14/2016, 11:32 AM

## 2016-05-14 NOTE — Progress Notes (Signed)
S:[name] is much improved toNoralyn [name], sitting up eating breakfast and conversant. She does not recall many events from yesterday. O:BP (!) 169/79   Pulse 83   Temp 98.2 F (36.8 C) (Oral)   Resp 13   Ht 5\' 6"  (1.676 m)   Wt 192 lb 7.4 oz (87.3 kg)   LMP 11/19/2011   SpO2 98%   BMI 31.06 kg/m   Intake/Output Summary (Last 24 hours) at 05/14/16 1312 Last data filed at 05/14/16 1155  Gross per 24 hour  Intake          4579.62 ml  Output             1650 ml  Net          2929.62 ml   Intake/Output: I/O last 3 completed shifts: In: 5868.5 [I.V.:5668.5; IV Piggyback:200] Out: 1025 [Urine:1025]  Intake/Output this shift:  Total I/O In: 523.3 [I.V.:523.3] Out: 800 [Urine:800] Weight change:  Gen: woman sitting up in bed in no distress CVS: Regular rate and rhythm, no murmur Resp: CTAB, normal WOB Abd: Nondistended, no graft site tendernes Ext: No peripheral edema, small laceration on right leg, multiple toe amputations b/l   Recent Labs Lab 05/13/16 0645 05/13/16 0843 05/13/16 1229 05/13/16 1637 05/13/16 2028 05/14/16 0040 05/14/16 0547  NA 134* 136 137 136 135 135 136  K 4.0 4.3 4.2 3.9 4.0 3.4* 3.1*  CL 101 107 111 110 109 105 105  CO2 11* 10* 17* 19* 17* 22 24  GLUCOSE 298* 295* 169* 154* 173* 184* 123*  BUN 35* 34* 31* 28* 27* 24* 19  CREATININE 2.09* 2.07* 1.60* 1.46* 1.33* 1.24* 1.11*  CALCIUM 9.0 9.3 9.4 9.4 9.3 9.6 9.6   Liver Function Tests: No results for input(s): AST, ALT, ALKPHOS, BILITOT, PROT, ALBUMIN in the last 168 hours. No results for input(s): LIPASE, AMYLASE in the last 168 hours. No results for input(s): AMMONIA in the last 168 hours. CBC:  Recent Labs Lab 05/12/16 2115 05/14/16 0547  WBC 15.7* 7.7  HGB 11.6* 9.8*  HCT 38.9 30.8*  MCV 92.2 86.3  PLT 359 216   Cardiac Enzymes:  Recent Labs Lab 05/13/16 0030 05/13/16 0645 05/13/16 1229  TROPONINI 0.03* 0.04* 0.08*   CBG:  Recent Labs Lab 05/14/16 0620 05/14/16 0730  05/14/16 0838 05/14/16 0945 05/14/16 1220  GLUCAP 121* 135* 165* 212* 164*    Iron Studies: No results for input(s): IRON, TIBC, TRANSFERRIN, FERRITIN in the last 72 hours. Studies/Results: Ct Head Wo Contrast  Result Date: 05/13/2016 CLINICAL DATA:  Confusion. Altered mental status. Diabetic ketoacidosis. EXAM: CT HEAD WITHOUT CONTRAST TECHNIQUE: Contiguous axial images were obtained from the base of the skull through the vertex without intravenous contrast. COMPARISON:  Brain MRI 04/10/2016 FINDINGS: Brain: No mass lesion, intraparenchymal hemorrhage or extra-axial collection. No evidence of acute cortical infarct. Brain parenchyma and CSF-containing spaces are normal for age. Vascular: No hyperdense vessel or atherosclerotic calcification. Skull: Normal visualized skull base, calvarium and extracranial soft tissues. Sinuses/Orbits: Complete opacification of the maxillary sinuses, left frontal sinus and left sphenoid sinus with near complete opacification of the bilateral ethmoid sinuses. Normal orbits. IMPRESSION: 1. No acute intracranial abnormality. 2. Findings of chronic sinusitis including complete opacification of the left frontal, left sphenoid and bilateral maxillary sinuses. Electronically Signed   By: Deatra RobinsonKevin  Herman M.D.   On: 05/13/2016 02:16   . amLODipine  5 mg Oral Daily  . carvedilol  50 mg Oral BID WC  . cefTRIAXone (ROCEPHIN) IVPB 1  gram/50 mL D5W  1 g Intravenous Q0600  . Chlorhexidine Gluconate Cloth  6 each Topical Q0600  . fluconazole (DIFLUCAN) IV  200 mg Intravenous Q24H  . insulin aspart  0-15 Units Subcutaneous TID WC  . insulin aspart  0-5 Units Subcutaneous QHS  . insulin aspart protamine- aspart  18 Units Subcutaneous BID WC  . levothyroxine  137 mcg Oral QAC breakfast  . mupirocin ointment  1 application Nasal BID  . mycophenolate  500 mg Oral BID  . predniSONE  5 mg Oral Q breakfast  . tacrolimus  0.5 mg Oral QHS  . tacrolimus  1 mg Oral Daily    BMET     Component Value Date/Time   NA 136 05/14/2016 0547   K 3.1 (L) 05/14/2016 0547   CL 105 05/14/2016 0547   CO2 24 05/14/2016 0547   GLUCOSE 123 (H) 05/14/2016 0547   BUN 19 05/14/2016 0547   CREATININE 1.11 (H) 05/14/2016 0547   CREATININE 1.07 10/29/2014 1005   CALCIUM 9.6 05/14/2016 0547   GFRNONAA 57 (L) 05/14/2016 0547   GFRAA >60 05/14/2016 0547   CBC    Component Value Date/Time   WBC 7.7 05/14/2016 0547   RBC 3.57 (L) 05/14/2016 0547   HGB 9.8 (L) 05/14/2016 0547   HCT 30.8 (L) 05/14/2016 0547   PLT 216 05/14/2016 0547   MCV 86.3 05/14/2016 0547   MCH 27.5 05/14/2016 0547   MCHC 31.8 05/14/2016 0547   RDW 14.4 05/14/2016 0547   LYMPHSABS 0.9 04/10/2016 0253   MONOABS 0.9 04/10/2016 0253   EOSABS 0.0 04/10/2016 0253   BASOSABS 0.0 04/10/2016 0253     Assessment/Plan:  1. Acute on Chronic CKD3 s/p renal transplant in 2011: Continue anti rejection medications of tacrolimus, mycophenolate, and prednisone. Creatinine has quickly improved now near baseline most likely due to prerenal injury from severe dehydration with DKA. She has not followed up regularly in the past, but fortunately her renal transplant continues to function well. Ideally we can observe one more day given how poor her status was yesterday and how poorly she has complied with hospital follow up in the past.  2. Acute encephalopathy: likely due to DKA.  Patient was initially mildly coherent when I examined her, but upon team rounds pt was clenching her teeth and had rigid muscle tone, so she could be having a seizure. Recommend consulting Neurology and get an EEG.  3. Diabetic ketoacidosis: CBGs better and gap is closed. Acidosis resolved.  4. HTN: Hypertensive today AM after improving volume status, can start resuming home antihypertensives  5. UTI: on rocephin but no bacterial culture growth found. Urine culture grew candida in the setting of renal transplant, so it should receive an adequate treatment  with diflucan for 5 days total duration.    Fuller Plan, MD PGY-II Internal Medicine Resident Pager# [phone] 05/14/2016, 1:12 PM  I have seen and examined this patient and agree with plan and assessment in the above note with renal recommendations/intervention highlighted.   [name] Longs A [name] Mumford,MD 05/15/2016 8:46 AM

## 2016-05-14 NOTE — [place] FL2 (Signed)
[place] MEDICAID FL2 LEVEL OF CARE SCREENING TOOL     IDENTIFICATION  Patient Name: [name] Birthdate: 1965-11-15 Sex: female Admission Date (Current Location): 05/12/2016  [facility] and [place] Number:  Producer, television/film/video and Address:  [facility] [place], [address], [place], [place] [address]      Provider Number: [id number]  Attending Physician Name and Address:  Elease Etienne, MD  Relative Name and Phone Number:       Current Level of Care: Hospital Recommended Level of Care: Skilled Nursing Facility Prior Approval Number:    Date Approved/Denied:   PASRR Number: [id number]  Discharge Plan: SNF    Current Diagnoses: Patient Active Problem List   Diagnosis Date Noted  . QT prolongation 05/13/2016  . UTI (lower urinary tract infection)   . Hypomagnesemia   . Pressure ulcer 04/11/2016  . Acute encephalopathy 04/11/2016  . Altered mental state 04/11/2016  . Altered mental status 04/10/2016  . Insulin dependent diabetes mellitus (HCC) 04/10/2016  . DKA (diabetic ketoacidoses) (HCC) 11/02/2015  . Hyperglycemia without ketosis 09/24/2015  . CKD (chronic kidney disease) stage 2, GFR 60-89 ml/min 09/24/2015  . History of renal transplant 09/24/2015  . Uncontrolled type 1 diabetes mellitus with foot ulcer (HCC) 06/04/2015  . History of renal transplantation 06/04/2015  . Foot ulcer, right (HCC)   . Foot abscess, right   . Diarrhea 06/02/2015  . DM (diabetes mellitus) type 2, uncontrolled, with ketoacidosis (HCC) 06/02/2015  . Type 1 diabetes mellitus with diabetic foot ulcer (HCC) 06/02/2015  . Acute renal failure superimposed on stage 3 chronic kidney disease (HCC) 06/02/2015  . Diabetic foot infection (HCC) 06/01/2015  . Essential hypertension 06/01/2015  . GERD (gastroesophageal reflux disease) 06/01/2015  . End stage renal disease (HCC) 10/24/2012  . H/O kidney transplant 08/04/2012  . High anion gap metabolic acidosis  08/04/2012  . Sepsis (HCC) 08/04/2012  . DKA, type 1 (HCC) 09/22/2011  . Hyperkalemia 09/22/2011  . Immunosuppression (HCC)   . Hypothyroidism     Orientation RESPIRATION BLADDER Height & Weight     Self, Situation, Place, Time  Normal Continent Weight: 87.3 kg (192 lb 7.4 oz) Height:  5\' 6"  (167.6 cm)  BEHAVIORAL SYMPTOMS/MOOD NEUROLOGICAL BOWEL NUTRITION STATUS      Continent Diet (Please see DC Summary)  AMBULATORY STATUS COMMUNICATION OF NEEDS Skin   Extensive Assist Verbally Normal                       Personal Care Assistance Level of Assistance  Bathing, Feeding, Dressing Bathing Assistance: Maximum assistance Feeding assistance: Independent Dressing Assistance: Limited assistance     Functional Limitations Info             SPECIAL CARE FACTORS FREQUENCY  PT (By licensed PT)     PT Frequency: 5x/week              Contractures      Additional Factors Info  Code Status, Allergies, Isolation Precautions, Insulin Sliding Scale Code Status Info: Full Allergies Info: Ace Inhibitors, Adhesive Tape, Lisinopril   Insulin Sliding Scale Info: insulin aspart (novoLOG) injection 0-15 Units;insulin aspart (novoLOG) injection 0-5 Units;insulin aspart protamine- aspart (NOVOLOG MIX 70/30) injection 18 Units; Isolation Precautions Info: MRSA     Current Medications (05/14/2016):  This is the current hospital active medication list Current Facility-Administered Medications  Medication Dose Route Frequency Provider Last Rate Last Dose  . 0.9 %  sodium chloride infusion  Intravenous Continuous Elease EtienneAnand D Hongalgi, MD 50 mL/hr at 05/14/16 1010    . acetaminophen (TYLENOL) tablet 650 mg  650 mg Oral Q4H PRN Roma KayserKatherine P Schorr, NP      . amLODipine (NORVASC) tablet 5 mg  5 mg Oral Daily Terrial RhodesJoseph Coladonato, MD   5 mg at 05/14/16 1009  . carvedilol (COREG) tablet 50 mg  50 mg Oral BID WC Therisa DoyneAnastassia Doutova, MD   50 mg at 05/14/16 1652  . cefTRIAXone (ROCEPHIN) 1 g in  dextrose 5 % 50 mL IVPB  1 g Intravenous Q0600 Therisa DoyneAnastassia Doutova, MD   1 g at 05/14/16 0508  . Chlorhexidine Gluconate Cloth 2 % PADS 6 each  6 each Topical Q0600 Therisa DoyneAnastassia Doutova, MD   6 each at 05/14/16 0507  . fluconazole (DIFLUCAN) tablet 200 mg  200 mg Oral q1800 Elease EtienneAnand D Hongalgi, MD   200 mg at 05/14/16 1700  . insulin aspart (novoLOG) injection 0-15 Units  0-15 Units Subcutaneous TID WC Elease EtienneAnand D Hongalgi, MD   3 Units at 05/14/16 1652  . insulin aspart (novoLOG) injection 0-5 Units  0-5 Units Subcutaneous QHS Elease EtienneAnand D Hongalgi, MD      . insulin aspart protamine- aspart (NOVOLOG MIX 70/30) injection 18 Units  18 Units Subcutaneous BID WC Elease EtienneAnand D Hongalgi, MD   18 Units at 05/14/16 1652  . levothyroxine (SYNTHROID, LEVOTHROID) tablet 137 mcg  137 mcg Oral QAC breakfast Therisa DoyneAnastassia Doutova, MD   137 mcg at 05/14/16 0507  . mupirocin ointment (BACTROBAN) 2 % 1 application  1 application Nasal BID Therisa DoyneAnastassia Doutova, MD   1 application at 05/14/16 0905  . mycophenolate (CELLCEPT) capsule 500 mg  500 mg Oral BID Therisa DoyneAnastassia Doutova, MD   500 mg at 05/14/16 0905  . predniSONE (DELTASONE) tablet 5 mg  5 mg Oral Q breakfast Therisa DoyneAnastassia Doutova, MD   5 mg at 05/14/16 0508  . tacrolimus (PROGRAF) capsule 0.5 mg  0.5 mg Oral QHS Therisa DoyneAnastassia Doutova, MD   0.5 mg at 05/13/16 2104  . tacrolimus (PROGRAF) capsule 1 mg  1 mg Oral Daily Therisa DoyneAnastassia Doutova, MD   1 mg at 05/14/16 [id number]     Discharge Medications: Please see discharge summary for a list of discharge medications.  Relevant Imaging Results:  Relevant Lab Results:   Additional Information SSN: [id number]  [name] Belson S [place], [place]

## 2016-05-14 NOTE — Progress Notes (Signed)
Spoke with patient about her diabetes. Was diagnosed at age 50 years old. Was started on oral medications for a few years.  Has been on insulin for about 18 years. Has been in DKA several times in the past. Currently taking Humulin Reli-on 70/30 22 units BID at home. Checks blood sugars 3 times per day.  She states that they run "high" and "low". No real pattern to the blood sugars. Was last seen by her PCP (she could not remember the name) Will order Living Well with Diabetes booklet. Staff RN will give patient DM Exit notes on general diabetes care. Will continue to monitor blood sugars while in the hospital. Smith MinceKendra Oma Alpert RN BSN CDE

## 2016-05-15 DIAGNOSIS — I4581 Long QT syndrome: Secondary | ICD-10-CM

## 2016-05-15 LAB — URINE CULTURE: Culture: <10000 — AB

## 2016-05-15 LAB — BASIC METABOLIC PANEL
ANION GAP: 10 (ref 5–15)
BUN: 11 mg/dL (ref 6–20)
CHLORIDE: 102 mmol/L (ref 101–111)
CO2: 23 mmol/L (ref 22–32)
Calcium: 9.5 mg/dL (ref 8.9–10.3)
Creatinine, Ser: 0.99 mg/dL (ref 0.44–1.00)
GFR calc non Af Amer: >60 mL/min (ref >60)
Glucose, Bld: 280 mg/dL — ABNORMAL HIGH (ref 65–99)
POTASSIUM: 3.7 mmol/L (ref 3.5–5.1)
SODIUM: 135 mmol/L (ref 135–145)

## 2016-05-15 LAB — CBC
HEMATOCRIT: 30.7 % — AB (ref 36.0–46.0)
HEMOGLOBIN: 9.6 g/dL — AB (ref 12.0–15.0)
MCH: 27.2 pg (ref 26.0–34.0)
MCHC: 31.3 g/dL (ref 30.0–36.0)
MCV: 87 fL (ref 78.0–100.0)
Platelets: 194 10*3/uL (ref 150–400)
RBC: 3.53 MIL/uL — AB (ref 3.87–5.11)
RDW: 14.3 % (ref 11.5–15.5)
WBC: 4.6 10*3/uL (ref 4.0–10.5)

## 2016-05-15 LAB — GLUCOSE, CAPILLARY
GLUCOSE-CAPILLARY: 257 mg/dL — AB (ref 65–99)
GLUCOSE-CAPILLARY: 243 mg/dL — AB (ref 65–99)
Glucose-Capillary: 395 mg/dL — ABNORMAL HIGH (ref 65–99)
Glucose-Capillary: 372 mg/dL — ABNORMAL HIGH (ref 65–99)

## 2016-05-15 MED ORDER — CARVEDILOL 25 MG PO TABS: 50.0000 mg | ORAL_TABLET | Freq: Two times a day (BID) | ORAL | Status: DC

## 2016-05-15 MED ORDER — INSULIN ASPART 100 UNIT/ML ~~LOC~~ SOLN
0.0000 [IU] | Freq: Every day | SUBCUTANEOUS | Status: DC
  Administered 2016-05-15: 2 [IU] via SUBCUTANEOUS

## 2016-05-15 MED ORDER — ACETAMINOPHEN 325 MG PO TABS: 650.0000 mg | ORAL_TABLET | Freq: Four times a day (QID) | ORAL | Status: DC | PRN

## 2016-05-15 MED ORDER — INSULIN ASPART PROT & ASPART (70-30 MIX) 100 UNIT/ML ~~LOC~~ SUSP
22.0000 [IU] | Freq: Two times a day (BID) | SUBCUTANEOUS | Status: DC
  Administered 2016-05-15 (×2): 22 [IU] via SUBCUTANEOUS
  Filled 2016-05-15: qty 10

## 2016-05-15 MED ORDER — MAGNESIUM SULFATE 2 GM/50ML IV SOLN
2.0000 g | Freq: Once | INTRAVENOUS | Status: AC
  Administered 2016-05-15: 2 g via INTRAVENOUS
  Filled 2016-05-15: qty 50

## 2016-05-15 MED ORDER — INSULIN ASPART 100 UNIT/ML ~~LOC~~ SOLN
0.0000 [IU] | Freq: Three times a day (TID) | SUBCUTANEOUS | Status: DC
  Administered 2016-05-15: 5 [IU] via SUBCUTANEOUS
  Administered 2016-05-15: 9 [IU] via SUBCUTANEOUS

## 2016-05-15 NOTE — Progress Notes (Signed)
Pr transferred to [facility] facility. Pt's vitals BP 110/62, HR 78, RR 18, sats 95 room air. Alert oriented X4.

## 2016-05-15 NOTE — Progress Notes (Signed)
[facility] ,pt alert and oriented, VSS. Awaiting for transport.

## 2016-05-15 NOTE — Discharge Summary (Signed)
Physician Discharge Summary  [name] TKZ:[id number] DOB: 10/25/1965  PCP: Dois DavenExie ParodyportICHTER,KAREN L., MD  Admit date: 05/12/2016 Discharge date: 05/15/2016  Admitted From: Home Disposition:  SNF  Recommendations for Outpatient Follow-up:  1. MD at SNF in 3 days with repeat labs (CBC, Renal Panel, Mg & EKG). 2. Recommend out patient Endocrinology consultation to better manage her DM. 3. Dr. Terrial RhodesJoseph Coladonato, Nephrology: patient has an appointment on 06/01/2016. To be seen with repeat labs (CBC & Renal Panel). SNF to arrange follow-up. 4. Dr. Nadyne CoombesKaren Richter, PCP upon discharge from SNF.  Home Health: None Equipment/Devices: None    Discharge Condition: Improved and stable  CODE STATUS: Full  Diet recommendation: Heart healthy and diabetic diet.  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 Labile DM 2/IDDM, recent hospitalization 8/4-8/13 for hypoglycemia and related AMS (prior hospitalization 03/31/16 for DKA), sustained a fall at home 3 days prior to admission, bruises and a laceration over right leg which was sutured in ED and discharged home on Keflex, [facility] with confusion. She had not been taking her medications because she was sleepy. In the ED, glucose 704, sodium 126, potassium 6.2, creatinine 2.3, bicarbonate 8, anion gap 27. Admitted to stepdown unit for management of DKA and acute kidney injury. Started on IV  insulin drip per DKA protocol. DKA resolved and then transitioned to 70/30 insulin and SSI on 9/7. Mental status changes have resolved.   Assessment & Plan:   DKA in poorly controlled type II DM/IDDM/? Brittle diabetes - Prior hospitalizations for DKA and hypoglycemia. Issues with noncompliance. - Apparently missed medications for a couple of days PTA since she was sleepy. - Presented with blood glucose of 704 and DKA - Admitted to stepdown unit and started on DKA protocol with aggressive IV fluid hydration and insulin drip. - DKA resolved and patient has been transitioned back to home dose of 70/30 insulin, sliding scale and metformin will be resumed at discharge - Hemoglobin A1c: 10.3. Has been consistently greater than 10 for past several months.  - Patient counseled extensively regarding compliance with medications. - Recommend outpatient endocrinology consultation for evaluation and better management of her diabetes. Patient seems to have some underlying cognitive issues which may be contributing to compliance. Left a message yesterday with patient's spouse to call back to discuss but have not heard back. - Fasting CBG this morning 395 mg per DL. Increased 70/30 to her home dose of insulin. - Monitor CBGs closely at SNF and adjust medications as deemed necessary.  Acute on stage III chronic kidney disease in transplanted kidney - Presented with creatinine of 2.3. Secondary to dehydration. Aggressively hydrated with IV fluids and acute renal failure has resolved. - Nephrology consultation appreciated. Creatinine has returned to baseline/1.1. Continue antirejection medications: Tacrolimus, MPM) and prednisone.  - Discussed with nephrology who have cleared her for discharge and have arranged outpatient follow-up with them on 06/01/16 with repeat labs. No bicarbonate recommended that discharge.  Hypokalemia - Replace.   Hypomagnesemia - Magnesium 1.4 on 9/7. Will be replaced IV prior to  discharge. Outpatient follow-up.  Funguria/Candiduria/Asymptomatic bacteriuria - As discussed with nephrology 9/6, given her immunocompromised state from immunosuppressants, treated with antifungals. - Urine culture shows insignificant growth. Discontinue IV Rocephin. Discussed with nephrology and since patient has received 2 days of fluconazole, discontinue same as well.  Acute encephalopathy - Possibly from DKA and acute kidney injury. CT head without acute findings.  - When nephrology saw her on 05/13/16, she was noted to be clenching her teeth and had a rigid muscle tone and there was suspicion for seizure like activity. She had received a dose of Ativan earlier in the morning on 9/6. - Neurology was consulted. EEG negative. As per discussion with nephrology who know her well, she is back to her baseline mental status.  Essential hypertension - as per report, patient apparently was taking carvedilol 50 mg twice a day which was continued in the hospital. Amlodipine 5 MG daily was also added. Blood pressures continue to be mildly uncontrolled and these medications may need further titration as outpatient.  Prolonged QTC - QTC was 518 on admission. EKG today shows QTC of 490 ms. Replacing magnesium before discharge. Follow E[facility] in a [facility] of [facility] and minimize use of precipitating medications.  Mildly elevated troponin - No reports of chest pain. Flat trend. EKG shows normal sinus rhythm with old RBBB and no acute changes. Likely related to demand ischemia from dehydration, acute kidney injury and DKA.  Status post renal transplant  - Continue antirejection medications. Has outpatient follow-up with nephrology.  Anemia - Hemoglobin has dropped about 2 g in the absence of overt bleeding. Possibly dilutional from IV fluids. Hemoglobin stable over the last 24 hours.  Chronic sinusitis - Seen on CT. Consider outpatient ENT consultation. Patient  asymptomatic.     Consultants:   Nephrology  Neurology  Procedures:   None  EEG 05/13/16: Impression: This is an abnormal EEG due to mild generalized slowing of brain activity. This is non-specific but likely related to a metabolic encephalopathy in this case. There is no evidence of seizure tendency and the patient is not in non-convulsive status epilepticus.    Discharge Instructions  Discharge Instructions    Call MD for:    Complete by:  As directed   Worsening mental confusion.   Diet - low sodium heart healthy    Complete by:  As directed   Diet Carb Modified    Complete by:  As directed   Increase activity slowly    Complete by:  As directed       Medication List    STOP taking these medications   BAYER BACK & BODY 500-32.5 MG Tabs Generic drug:  Aspirin-Caffeine   cephALEXin 500 MG capsule Commonly known as:  KEFLEX   HYDROcodone-acetaminophen 5-325 MG tablet Commonly known as:  NORCO/VICODIN   nystatin cream Commonly known as:  MYCOSTATIN   sodium bicarbonate 650 MG tablet     TAKE these medications   acetaminophen 325 MG tablet Commonly known as:  TYLENOL Take 2 tablets (650 mg total) by mouth every 6 (six) hours as needed for mild pain, moderate pain, fever or headache.   amLODipine 5 MG tablet Commonly known as:  NORVASC Take 5 mg by mouth daily.   aspirin EC 81 MG tablet Take 81 mg by mouth daily.   carvedilol 25 MG tablet Commonly known as:  COREG Take 2 tablets (50  mg total) by mouth 2 (two) times daily.   insulin aspart 100 UNIT/ML injection Commonly known as:  novoLOG Before each meal 3 times a day, 140-199 - 2 units, 200-250 - 4 units, 251-299 - 6 units,  300-349 - 8 units,  350 or above 10 units. Insulin PEN if approved, provide syringes and needles if needed.   insulin NPH-regular Human (70-30) 100 UNIT/ML injection Commonly known as:  NOVOLIN 70/30 Inject 22 Units into the skin 2 (two) times daily with a meal.    levothyroxine 137 MCG tablet Commonly known as:  SYNTHROID, LEVOTHROID Take 1 tablet (137 mcg total) by mouth daily. BRAND NAME ONLY   metFORMIN 500 MG 24 hr tablet Commonly known as:  GLUCOPHAGE-XR Take 500 mg by mouth daily with breakfast.   mycophenolate 250 MG capsule Commonly known as:  CELLCEPT Take 500 mg by mouth 2 (two) times daily.   predniSONE 5 MG tablet Commonly known as:  DELTASONE Take 5 mg by mouth daily.   ranitidine 150 MG tablet Commonly known as:  ZANTAC Take 150 mg by mouth daily.   tacrolimus 1 MG capsule Commonly known as:  PROGRAF Take 0.5-1 mg by mouth See admin instructions. 1mg  in the morning and 0.5mg  in the evening      Follow-up Information    Dois Davenport., MD. Schedule an appointment as soon as possible for a visit today.   Specialty:  Family Medicine Why:  Upon discarge from SNF. Contact information: [address] [place] [address] [phone]        [name], MD Follow up on 06/01/2016.   Specialty:  Nephrology Why:  Patient has appointment. SNF to coordinate. To be seen with repeat labs (CBC & renal panel). Contact information: [address] [place] [address] [phone]        M.D. at SNF. Schedule an appointment as soon as possible for a visit in 3 day(s).   Why:  To be seen with repeat labs (CBC, renal panel, Mg & EKG). Monitor and adjust diabetic medications as needed.         Allergies  Allergen Reactions  . Ace Inhibitors Cough  . Adhesive [Tape] Itching and Other (See Comments)    Please use paper tape  . Lisinopril Cough    Procedures/Studies: Ct Head Wo Contrast  Result Date: 05/13/2016 CLINICAL DATA:  Confusion. Altered mental status. Diabetic ketoacidosis. EXAM: CT HEAD WITHOUT CONTRAST TECHNIQUE: Contiguous axial images were obtained from the base of the skull through the vertex without intravenous contrast. COMPARISON:  Brain MRI 04/10/2016 FINDINGS: Brain: No mass lesion,  intraparenchymal hemorrhage or extra-axial collection. No evidence of acute cortical infarct. Brain parenchyma and CSF-containing spaces are normal for age. Vascular: No hyperdense vessel or atherosclerotic calcification. Skull: Normal visualized skull base, calvarium and extracranial soft tissues. Sinuses/Orbits: Complete opacification of the maxillary sinuses, left frontal sinus and left sphenoid sinus with near complete opacification of the bilateral ethmoid sinuses. Normal orbits. IMPRESSION: 1. No acute intracranial abnormality. 2. Findings of chronic sinusitis including complete opacification of the left frontal, left sphenoid and bilateral maxillary sinuses. Electronically Signed   By: Deatra Robinson M.D.   On: 05/13/2016 02:16   Dg Knee Complete 4 Views Left  Result Date: 05/10/2016 CLINICAL DATA:  Pt fell on tile kitchen floor at home, landed both knees first. On her left knee she feels pain medially and anteriorly, and her right knee has pain anteriorly and just above the patella. EXAM: LEFT KNEE -  COMPLETE 4+ VIEW COMPARISON:  None. FINDINGS: No evidence of fracture, dislocation, or joint effusion. No evidence of arthropathy or other focal bone abnormality. Significant arterial calcifications noted. IMPRESSION: No evidence for acute  abnormality. Electronically Signed   By: Norva Pavlov M.D.   On: 05/10/2016 20:36   Dg Knee Complete 4 Views Right  Result Date: 05/10/2016 CLINICAL DATA:  Pt fell on tile kitchen floor at home, landed both knees first. On her left knee she feels pain medially and anteriorly, and her right knee has pain anteriorly and just above the patella. EXAM: RIGHT KNEE - COMPLETE 4+ VIEW COMPARISON:  None. FINDINGS: No evidence of fracture, dislocation, or joint effusion. No evidence of arthropathy or other focal bone abnormality. Arterial calcifications noted. IMPRESSION: No evidence for acute  abnormality. Electronically Signed   By: Norva Pavlov M.D.   On: 05/10/2016  20:37      Subjective: Denies complaints. No pain reported. Confirms the amount of insulin that she takes usually at home. Counseled regarding importance of compliance with all aspects of medical care and she verbalized understanding. As per RN, no acute issues.   Discharge Exam:  Vitals:   05/15/16 0419 05/15/16 0758 05/15/16 0939 05/15/16 1131  BP: (!) 161/66 (!) 159/70 (!) 159/70 (!) 177/82  Pulse: 79 85  81  Resp: (!) 21 19  17   Temp:  97.8 F (36.6 C)  98.6 F (37 C)  TempSrc:  Oral  Oral  SpO2: 96% 98%  98%  Weight:      Height:        General exam: Pleasant young female sitting up comfortably in bed and combing her hair. Respiratory system: Clear to auscultation. Respiratory effort normal. Cardiovascular system: S1 & S2 heard, RRR. No JVD, murmurs, rubs, gallops or clicks. No pedal edema. Telemetry: Sinus rhythm..  Gastrointestinal system: Abdomen is nondistended, soft and nontender. No organomegaly or masses felt. Normal bowel sounds heard. Central nervous system: Alert and oriented 4 . No focal neurological deficits. Neck supple  Extremities: moving all limbs symmetrically well. Multiple scabs on lower extremities and recently sutured right mid lateral leg laceration with some small hematoma but no other acute findings.  Skin: No rashes, lesions or ulcers Psychiatry: Judgement and insight appear impaired. Mood & affect appropriate .     The results of significant diagnostics from this hospitalization (including imaging, microbiology, ancillary and laboratory) are listed below for reference.     Microbiology: Recent Results (from the past 240 hour(s))  MRSA PCR Screening     Status: Abnormal   Collection Time: 05/13/16  3:15 AM  Result Value Ref Range Status   MRSA by PCR POSITIVE (A) NEGATIVE Final    Comment:        The GeneXpert MRSA Assay (FDA approved for NASAL specimens only), is one component of a comprehensive MRSA colonization surveillance program.  It is not intended to diagnose MRSA infection nor to guide or monitor treatment for MRSA infections. RESULT CALLED TO, READ BACK BY AND VERIFIED WITH: A KORSLIEN,RN @0610  05/13/16 MKELLY   Culture, Urine     Status: Abnormal   Collection Time: 05/14/16  3:52 PM  Result Value Ref Range Status   Specimen Description URINE, RANDOM  Final   Special Requests NONE  Final   Culture <10,000 COLONIES/mL INSIGNIFICANT GROWTH (A)  Final   Report Status 05/15/2016 FINAL  Final     Labs: BNP (last 3 results) No results for input(s): BNP in the last 8760 hours.  Basic Metabolic Panel:  Recent Labs Lab 05/13/16 1637 05/13/16 2028 05/14/16 0040 05/14/16 0547 05/15/16 0232  NA 136 135 135 136 135  K 3.9 4.0 3.4* 3.1* 3.7  CL 110 109 105 105 102  CO2 19* 17* 22 24 23   GLUCOSE 154* 173* 184* 123* 280*  BUN 28* 27* 24* 19 11  CREATININE 1.46* 1.33* 1.24* 1.11* 0.99  CALCIUM 9.4 9.3 9.6 9.6 9.5  MG  --   --   --  1.4*  --    Liver Function Tests: No results for input(s): AST, ALT, ALKPHOS, BILITOT, PROT, ALBUMIN in the last 168 hours. No results for input(s): LIPASE, AMYLASE in the last 168 hours. No results for input(s): AMMONIA in the last 168 hours. CBC:  Recent Labs Lab 05/12/16 2115 05/14/16 0547 05/15/16 0232  WBC 15.7* 7.7 4.6  HGB 11.6* 9.8* 9.6*  HCT 38.9 30.8* 30.7*  MCV 92.2 86.3 87.0  PLT 359 216 194   Cardiac Enzymes:  Recent Labs Lab 05/13/16 0030 05/13/16 0645 05/13/16 1229  TROPONINI 0.03* 0.04* 0.08*   BNP: Invalid input(s): POCBNP CBG:  Recent Labs Lab 05/14/16 0945 05/14/16 1220 05/14/16 1621 05/14/16 2204 05/15/16 0756  GLUCAP 212* 164* 185* 161* 395*   Hgb A1c  Recent Labs  05/13/16 0645  HGBA1C 10.3*   Urinalysis    Component Value Date/Time   COLORURINE YELLOW 05/12/2016 2116   APPEARANCEUR CLOUDY (A) 05/12/2016 2116   LABSPEC 1.026 05/12/2016 2116   PHURINE 5.5 05/12/2016 2116   GLUCOSEU >1000 (A) 05/12/2016 2116   HGBUR  LARGE (A) 05/12/2016 2116   BILIRUBINUR SMALL (A) 05/12/2016 2116   KETONESUR >80 (A) 05/12/2016 2116   PROTEINUR 100 (A) 05/12/2016 2116   UROBILINOGEN 0.2 03/02/2015 1153   NITRITE NEGATIVE 05/12/2016 2116   LEUKOCYTESUR SMALL (A) 05/12/2016 2116      Time coordinating discharge: Over 30 minutes  SIGNED:  Marcellus Scott, MD, FACP, FHM. [facility] Pager [phone]  If 7PM-7AM, please contact night-coverage [email or web address] Password [facility] 05/15/2016, 12:35 PM

## 2016-05-15 NOTE — Clinical Social Work Placement (Signed)
   CLINICAL SOCIAL WORK PLACEMENT  NOTE  Date:  05/15/2016  Patient Details  Name: [name] MRN: [id number] Date of Birth: [birth year]  Clinical Social Work is seeking post-discharge placement for this patient at the Skilled  Nursing Facility level of care (*CSW will initial, date and re-position this form in  chart as items are completed):      Patient/family provided with [facility] Clinical Social Work Department's list of facilities offering this level of care within the geographic area requested by the patient (or if unable, by the patient's family).      Patient/family informed of their freedom to choose among providers that offer the needed level of care, that participate in Medicare, Medicaid or managed care program needed by the patient, have an available bed and are willing to accept the patient.      Patient/family informed of [place]'s ownership interest in [facility] and [facility], as well as of the fact that they are under no obligation to receive care at these facilities.  PASRR submitted to EDS on 05/14/16     PASRR number received on 05/14/16     Existing PASRR number confirmed on       FL2 transmitted to all facilities in geographic area requested by pt/family on 05/14/16     FL2 transmitted to all facilities within larger geographic area on       Patient informed that his/her managed care company has contracts with or will negotiate with certain facilities, including the following:        Yes   Patient/family informed of bed offers received.  Patient chooses bed at [facility] [place]     Physician recommends and patient chooses bed at      Patient to be transferred to [facility] [place] on 05/15/16.  Patient to be transferred to facility by ptar     Patient family notified on 05/15/16 of transfer.  Name of family member notified:  pt to notify family     PHYSICIAN Please sign FL2     Additional Comment:     _______________________________________________ Burna SisUris, Kasara Schomer H, LCSW 05/15/2016, 2:19 PM

## 2016-05-15 NOTE — Consult Note (Signed)
   [facility] CM Inpatient Consult   05/15/2016  [name] 08-12-1966 [id number]  Patient screened for potential [facility] Care Management services for 3 admissions and 3 ED visits in the past 6 months.  Patient is to discharge to a skilled nursing facility.  Spoke with inpatient RNCM. [name]. [facility] is [facility] under patient's Medicare  plan.  For questions contact:   Charlesetta ShanksVictoria Sheketa Ende, RN BSN CCM [facility] Liaison  [phone] business mobile phone Toll free office [phone]

## 2016-05-15 NOTE — Progress Notes (Signed)
Patient will discharge to [facility] Anticipated discharge date: 9/8 Family notified: pt to notify husband Transportation by PTAR- scheduled for 3:30pm  CSW signing off.  Burna SisJenna H. Saulo Anthis, LCSWA Clinical Social Worker [phone]

## 2016-05-15 NOTE — Progress Notes (Signed)
Patient ID: [name], female   DOB: 01-Mar-1966, 50 y.o.   MRN: [id number] S:Feels better but is still fuzzy on the details of what happened. O:BP (!) 159/70 (BP Location: Left Arm)   Pulse 85   Temp 97.8 F (36.6 C) (Oral)   Resp 19   Ht 5\' 6"  (1.676 m)   Wt 87.3 kg (192 lb 7.4 oz)   LMP 11/19/2011   SpO2 98%   BMI 31.06 kg/m   Intake/Output Summary (Last 24 hours) at 05/15/16 0929 Last data filed at 05/15/16 0856  Gross per 24 hour  Intake           1984.3 ml  Output             3900 ml  Net          -1915.7 ml   Intake/Output: I/O last 3 completed shifts: In: 5097.8 [P.O.:960; I.V.:4087.8; IV Piggyback:50] Out: 4400 [Urine:4400]  Intake/Output this shift:  Total I/O In: -  Out: 850 [Urine:850] Weight change:  Gen:WD obese WF in NAD CVS:no rub Resp:cta ION:GEXBMW Ext: no edema   Recent Labs Lab 05/13/16 0843 05/13/16 1229 05/13/16 1637 05/13/16 2028 05/14/16 0040 05/14/16 0547 05/15/16 0232  NA 136 137 136 135 135 136 135  K 4.3 4.2 3.9 4.0 3.4* 3.1* 3.7  CL 107 111 110 109 105 105 102  CO2 10* 17* 19* 17* 22 24 23   GLUCOSE 295* 169* 154* 173* 184* 123* 280*  BUN 34* 31* 28* 27* 24* 19 11  CREATININE 2.07* 1.60* 1.46* 1.33* 1.24* 1.11* 0.99  CALCIUM 9.3 9.4 9.4 9.3 9.6 9.6 9.5   Liver Function Tests: No results for input(s): AST, ALT, ALKPHOS, BILITOT, PROT, ALBUMIN in the last 168 hours. No results for input(s): LIPASE, AMYLASE in the last 168 hours. No results for input(s): AMMONIA in the last 168 hours. CBC:  Recent Labs Lab 05/12/16 2115 05/14/16 0547 05/15/16 0232  WBC 15.7* 7.7 4.6  HGB 11.6* 9.8* 9.6*  HCT 38.9 30.8* 30.7*  MCV 92.2 86.3 87.0  PLT 359 216 194   Cardiac Enzymes:  Recent Labs Lab 05/13/16 0030 05/13/16 0645 05/13/16 1229  TROPONINI 0.03* 0.04* 0.08*   CBG:  Recent Labs Lab 05/14/16 0945 05/14/16 1220 05/14/16 1621 05/14/16 2204 05/15/16 0756  GLUCAP 212* 164* 185* 161* 395*    Iron Studies: No  results for input(s): IRON, TIBC, TRANSFERRIN, FERRITIN in the last 72 hours. Studies/Results: No results found. [name] amLODipine  5 mg Oral Daily  . carvedilol  50 mg Oral BID WC  . cefTRIAXone (ROCEPHIN) IVPB 1 gram/50 mL D5W  1 g Intravenous Q0600  . Chlorhexidine Gluconate Cloth  6 each Topical Q0600  . fluconazole  200 mg Oral q1800  . insulin aspart  0-5 Units Subcutaneous QHS  . insulin aspart  0-9 Units Subcutaneous TID WC  . insulin aspart protamine- aspart  22 Units Subcutaneous BID WC  . levothyroxine  137 mcg Oral QAC breakfast  . mupirocin ointment  1 application Nasal BID  . mycophenolate  500 mg Oral BID  . predniSONE  5 mg Oral Q breakfast  . tacrolimus  0.5 mg Oral QHS  . tacrolimus  1 mg Oral Daily    BMET    Component Value Date/Time   NA 135 05/15/2016 0232   K 3.7 05/15/2016 0232   CL 102 05/15/2016 0232   CO2 23 05/15/2016 0232   GLUCOSE 280 (H) 05/15/2016 0232   BUN 11 05/15/2016 0232  CREATININE 0.99 05/15/2016 0232   CREATININE 1.07 10/29/2014 1005   CALCIUM 9.5 05/15/2016 0232   GFRNONAA >60 05/15/2016 0232   GFRAA >60 05/15/2016 0232   CBC    Component Value Date/Time   WBC 4.6 05/15/2016 0232   RBC 3.53 (L) 05/15/2016 0232   HGB 9.6 (L) 05/15/2016 0232   HCT 30.7 (L) 05/15/2016 0232   PLT 194 05/15/2016 0232   MCV 87.0 05/15/2016 0232   MCH 27.2 05/15/2016 0232   MCHC 31.3 05/15/2016 0232   RDW 14.3 05/15/2016 0232   LYMPHSABS 0.9 04/10/2016 0253   MONOABS 0.9 04/10/2016 0253   EOSABS 0.0 04/10/2016 0253   BASOSABS 0.0 04/10/2016 0253     Assessment/Plan:  1. AKI- in setting of DKA.  Now back to baseline 2. ESRD s/p DDKT- continue with immunosuppressive regimen 3. DKA- improved 4. HTN- resume medications  5. Anemia- of acute illness. 6. AMS- resolved 7. Fungal uti- on fluconazole (complete 5 day course) 8. Disposition- will sign off and have patient follow up in our office on 06/01/16 at 10 [facility], [address]  Julien NordmannJoseph A Sundae Maners

## 2016-05-15 NOTE — Clinical Social Work Note (Signed)
Clinical Social Work Assessment  Patient Details  Name: [name] MRN: [id number] Date of Birth: [birth year]  Date of referral:  05/15/16               Reason for consult:  Facility Placement                Permission sought to share information with:  Facility Industrial/product designerContact Representative Permission granted to share information::  Yes, Verbal Permission Granted  Name::        Agency::  SNFs  Relationship::     Contact Information:     Housing/Transportation Living arrangements for the past 2 months:  Single Family Home Source of Information:  Patient Patient Interpreter Needed:  None Criminal Activity/Legal Involvement Pertinent to Current Situation/Hospitalization:  No - Comment as needed Significant Relationships:  Spouse Lives with:  Minor Children, Spouse Do you feel safe going back to the place where you live?  No Need for family participation in patient care:  No (Coment)  Care giving concerns:  Pt lives with spouse and minor child- pt with increased weakness and does not have support during the day (spouse works and child is in school)   Office managerocial Worker assessment / plan:  CSW spoke with pt concerning PT recommendation for SNF.  CSW explained SNF options and purpose- pt reports never having been to SNF but feels uncomfortable going home with current level of weakness.  Employment status:  Disabled (Comment on whether or not currently receiving Disability) Insurance information:  Medicare PT Recommendations:  Skilled Nursing Facility Information / Referral to community resources:  Skilled Nursing Facility  Patient/Family's Response to care: Pt is agreeable to SNF and is hopeful to only stay a short time while she is in recovery.  Patient/Family's Understanding of and Emotional Response to Diagnosis, Current Treatment, and Prognosis:  Pt has no questions or concerns- I glad current condition is better than she was on last admission a month ago.  Emotional  Assessment Appearance:  Appears stated age Attitude/Demeanor/Rapport:    Affect (typically observed):  Appropriate, Flat Orientation:  Oriented to Self, Oriented to Place, Oriented to  Time, Oriented to Situation Alcohol / Substance use:  Not Applicable Psych involvement (Current and /or in the community):  No (Comment)  Discharge Needs  Concerns to be addressed:  Care Coordination Readmission within the last 30 days:  Yes Current discharge risk:  None Barriers to Discharge:  No Barriers Identified   [name], [name] Shannon H, LCSW 05/15/2016, 9:05 AM

## 2016-05-15 NOTE — Discharge Instructions (Signed)
Diabetic Ketoacidosis °Diabetic ketoacidosis is a life-threatening complication of diabetes. If it is not treated, it can cause severe dehydration and organ damage and can lead to a coma or death. °CAUSES °This condition develops when there is not enough of the hormone insulin in the body. Insulin helps the body to break down sugar for energy. Without insulin, the body cannot break down sugar, so it breaks down fats instead. This leads to the production of acids that are called ketones. Ketones are poisonous at high levels. °This condition can be triggered by: °· Stress on the body that is brought on by an illness. °· Medicines that raise blood glucose levels. °· Not taking diabetes medicine. °SYMPTOMS °Symptoms of this condition include: °· Fatigue. °· Weight loss. °· Excessive thirst. °· Light-headedness. °· Fruity or sweet-smelling breath. °· Excessive urination. °· Vision changes. °· Confusion or irritability. °· Nausea. °· Vomiting. °· Rapid breathing. °· Abdominal pain. °· Feeling flushed. °DIAGNOSIS °This condition is diagnosed based on a medical history, a physical exam, and blood tests. You may also have a urine test that checks for ketones. °TREATMENT °This condition may be treated with: °· Fluid replacement. This may be done to correct dehydration. °· Insulin injections. These may be given through the skin or through an IV tube. °· Electrolyte replacement. Electrolytes, such as potassium and sodium, may be given in pill form or through an IV tube. °· Antibiotic medicines. These may be prescribed if your condition was caused by an infection. °HOME CARE INSTRUCTIONS °Eating and Drinking °· Drink enough fluids to keep your urine clear or pale yellow. °· If you cannot eat, alternate between drinking fluids with sugar (such as juice) and salty fluids (such as broth or bouillon). °· If you can eat, follow your usual diet and drink sugar-free liquids, such as water. °Other Instructions °· Take insulin as  directed by your health care provider. Do not skip insulin injections. Do not use expired insulin. °· If your blood sugar is over 240 mg/dL, monitor your urine ketones every 4-6 hours. °· If you were prescribed an antibiotic medicine, finish all of it even if you start to feel better. °· Rest and exercise only as directed by your health care provider. °· If you get sick, call your health care provider and begin treatment quickly. Your body often needs extra insulin to fight an illness. °· Check your blood glucose levels regularly. If your blood glucose is high, drink plenty of fluids. This helps to flush out ketones. °SEEK MEDICAL CARE IF: °· Your blood glucose level is too high or too low. °· You have ketones in your urine. °· You have a fever. °· You cannot eat. °· You cannot tolerate fluids. °· You have been vomiting for more than 2 hours. °· You continue to have symptoms of this condition. °· You develop new symptoms. °SEEK IMMEDIATE MEDICAL CARE IF: °· Your blood glucose levels continue to be high (elevated). °· Your monitor reads "high" even when you are taking insulin. °· You faint. °· You have chest pain. °· You have trouble breathing. °· You have a sudden, severe headache. °· You have sudden weakness in one arm or one leg. °· You have sudden trouble speaking or swallowing. °· You have vomiting or diarrhea that gets worse after 3 hours. °· You feel severely fatigued. °· You have trouble thinking. °· You have abdominal pain. °· You are severely dehydrated. Symptoms of severe dehydration include: °¨ Extreme thirst. °¨ Dry mouth. °¨ Blue lips. °¨   Cold hands and feet. °¨ Rapid breathing. °  °This information is not intended to replace advice given to you by your health care provider. Make sure you discuss any questions you have with your health care provider. °  °Document Released: 08/21/2000 Document Revised: 01/08/2015 Document Reviewed: 08/01/2014 °Elsevier Interactive Patient Education ©2016 [employer]  Inc. ° °

## 2016-06-26 ENCOUNTER — Inpatient Hospital Stay (HOSPITAL_COMMUNITY)
Admission: EM | Admit: 2016-06-26 | Discharge: 2016-06-30 | DRG: 638 | Disposition: A | Discharge status: Home / Self Care | Payer: Medicare Other | Attending: Internal Medicine | Admitting: Internal Medicine

## 2016-06-26 ENCOUNTER — Encounter (HOSPITAL_COMMUNITY): Payer: Self-pay | Admitting: Emergency Medicine

## 2016-06-26 DIAGNOSIS — Z79899 Other long term (current) drug therapy: Secondary | ICD-10-CM

## 2016-06-26 DIAGNOSIS — L03115 Cellulitis of right lower limb: Secondary | ICD-10-CM | POA: Diagnosis not present

## 2016-06-26 DIAGNOSIS — IMO0001 Reserved for inherently not codable concepts without codable children: Secondary | ICD-10-CM

## 2016-06-26 DIAGNOSIS — E1065 Type 1 diabetes mellitus with hyperglycemia: Secondary | ICD-10-CM | POA: Diagnosis present

## 2016-06-26 DIAGNOSIS — E10628 Type 1 diabetes mellitus with other skin complications: Secondary | ICD-10-CM | POA: Diagnosis not present

## 2016-06-26 DIAGNOSIS — Z940 Kidney transplant status: Secondary | ICD-10-CM

## 2016-06-26 DIAGNOSIS — E119 Type 2 diabetes mellitus without complications: Secondary | ICD-10-CM

## 2016-06-26 DIAGNOSIS — Z91048 Other nonmedicinal substance allergy status: Secondary | ICD-10-CM

## 2016-06-26 DIAGNOSIS — N183 Chronic kidney disease, stage 3 unspecified: Secondary | ICD-10-CM | POA: Diagnosis present

## 2016-06-26 DIAGNOSIS — Z888 Allergy status to other drugs, medicaments and biological substances status: Secondary | ICD-10-CM

## 2016-06-26 DIAGNOSIS — Z9181 History of falling: Secondary | ICD-10-CM

## 2016-06-26 DIAGNOSIS — Z794 Long term (current) use of insulin: Secondary | ICD-10-CM

## 2016-06-26 DIAGNOSIS — N179 Acute kidney failure, unspecified: Secondary | ICD-10-CM | POA: Diagnosis present

## 2016-06-26 DIAGNOSIS — E669 Obesity, unspecified: Secondary | ICD-10-CM | POA: Diagnosis present

## 2016-06-26 DIAGNOSIS — E10621 Type 1 diabetes mellitus with foot ulcer: Secondary | ICD-10-CM | POA: Diagnosis present

## 2016-06-26 DIAGNOSIS — Z7982 Long term (current) use of aspirin: Secondary | ICD-10-CM

## 2016-06-26 DIAGNOSIS — R739 Hyperglycemia, unspecified: Secondary | ICD-10-CM | POA: Diagnosis not present

## 2016-06-26 DIAGNOSIS — Z87891 Personal history of nicotine dependence: Secondary | ICD-10-CM

## 2016-06-26 DIAGNOSIS — E10649 Type 1 diabetes mellitus with hypoglycemia without coma: Secondary | ICD-10-CM | POA: Diagnosis not present

## 2016-06-26 DIAGNOSIS — K219 Gastro-esophageal reflux disease without esophagitis: Secondary | ICD-10-CM | POA: Diagnosis present

## 2016-06-26 DIAGNOSIS — L0390 Cellulitis, unspecified: Secondary | ICD-10-CM | POA: Diagnosis present

## 2016-06-26 DIAGNOSIS — E1022 Type 1 diabetes mellitus with diabetic chronic kidney disease: Secondary | ICD-10-CM | POA: Diagnosis present

## 2016-06-26 DIAGNOSIS — N186 End stage renal disease: Secondary | ICD-10-CM | POA: Diagnosis present

## 2016-06-26 DIAGNOSIS — I120 Hypertensive chronic kidney disease with stage 5 chronic kidney disease or end stage renal disease: Secondary | ICD-10-CM | POA: Diagnosis present

## 2016-06-26 DIAGNOSIS — E039 Hypothyroidism, unspecified: Secondary | ICD-10-CM | POA: Diagnosis present

## 2016-06-26 DIAGNOSIS — Z7952 Long term (current) use of systemic steroids: Secondary | ICD-10-CM

## 2016-06-26 DIAGNOSIS — IMO0002 Reserved for concepts with insufficient information to code with codable children: Secondary | ICD-10-CM | POA: Diagnosis present

## 2016-06-26 DIAGNOSIS — Z89422 Acquired absence of other left toe(s): Secondary | ICD-10-CM

## 2016-06-26 DIAGNOSIS — L97219 Non-pressure chronic ulcer of right calf with unspecified severity: Secondary | ICD-10-CM | POA: Diagnosis present

## 2016-06-26 DIAGNOSIS — E162 Hypoglycemia, unspecified: Secondary | ICD-10-CM

## 2016-06-26 DIAGNOSIS — L97509 Non-pressure chronic ulcer of other part of unspecified foot with unspecified severity: Secondary | ICD-10-CM

## 2016-06-26 DIAGNOSIS — Z6830 Body mass index (BMI) 30.0-30.9, adult: Secondary | ICD-10-CM

## 2016-06-26 HISTORY — DX: Chronic kidney disease, unspecified: N18.9

## 2016-06-26 LAB — URINALYSIS, ROUTINE W REFLEX MICROSCOPIC
BILIRUBIN URINE: NEGATIVE
KETONES UR: 15 mg/dL — AB
Leukocytes, UA: NEGATIVE
NITRITE: NEGATIVE
PH: 5.5 (ref 5.0–8.0)
Protein, ur: 100 mg/dL — AB
Specific Gravity, Urine: 1.025 (ref 1.005–1.030)

## 2016-06-26 LAB — CBC
HEMATOCRIT: 35.9 % — AB (ref 36.0–46.0)
Hemoglobin: 11.3 g/dL — ABNORMAL LOW (ref 12.0–15.0)
MCH: 28 pg (ref 26.0–34.0)
MCHC: 31.5 g/dL (ref 30.0–36.0)
MCV: 89.1 fL (ref 78.0–100.0)
Platelets: 196 10*3/uL (ref 150–400)
RBC: 4.03 MIL/uL (ref 3.87–5.11)
RDW: 14.1 % (ref 11.5–15.5)
WBC: 7.3 10*3/uL (ref 4.0–10.5)

## 2016-06-26 LAB — BASIC METABOLIC PANEL
Anion gap: 9 (ref 5–15)
BUN: 35 mg/dL — AB (ref 6–20)
CO2: 21 mmol/L — AB (ref 22–32)
Calcium: 9.7 mg/dL (ref 8.9–10.3)
Chloride: 102 mmol/L (ref 101–111)
Creatinine, Ser: 1.39 mg/dL — ABNORMAL HIGH (ref 0.44–1.00)
GFR calc Af Amer: 51 mL/min — ABNORMAL LOW (ref >60)
GFR, EST NON AFRICAN AMERICAN: 44 mL/min — AB (ref >60)
GLUCOSE: 560 mg/dL — AB (ref 65–99)
POTASSIUM: 4.2 mmol/L (ref 3.5–5.1)
Sodium: 132 mmol/L — ABNORMAL LOW (ref 135–145)

## 2016-06-26 LAB — URINE MICROSCOPIC-ADD ON

## 2016-06-26 LAB — GLUCOSE, CAPILLARY
GLUCOSE-CAPILLARY: 451 mg/dL — AB (ref 65–99)
Glucose-Capillary: 343 mg/dL — ABNORMAL HIGH (ref 65–99)

## 2016-06-26 LAB — CBG MONITORING, ED
Glucose-Capillary: 516 mg/dL (ref 65–99)
Glucose-Capillary: 458 mg/dL — ABNORMAL HIGH (ref 65–99)

## 2016-06-26 LAB — I-STAT CG4 LACTIC ACID, ED: Lactic Acid, Venous: 1.28 mmol/L (ref 0.5–1.9)

## 2016-06-26 MED ORDER — HYDROCODONE-ACETAMINOPHEN 5-325 MG PO TABS
1.0000 | ORAL_TABLET | Freq: Four times a day (QID) | ORAL | Status: DC | PRN
  Administered 2016-06-26 – 2016-06-29 (×4): 2 via ORAL
  Filled 2016-06-26 (×5): qty 2

## 2016-06-26 MED ORDER — INSULIN ASPART PROT & ASPART (70-30 MIX) 100 UNIT/ML ~~LOC~~ SUSP
30.0000 [IU] | Freq: Two times a day (BID) | SUBCUTANEOUS | Status: DC
  Filled 2016-06-26: qty 10

## 2016-06-26 MED ORDER — MYCOPHENOLATE MOFETIL 250 MG PO CAPS
500.0000 mg | ORAL_CAPSULE | Freq: Two times a day (BID) | ORAL | Status: DC
  Administered 2016-06-26 – 2016-06-30 (×8): 500 mg via ORAL
  Filled 2016-06-26 (×9): qty 2

## 2016-06-26 MED ORDER — CARVEDILOL 25 MG PO TABS
50.0000 mg | ORAL_TABLET | Freq: Two times a day (BID) | ORAL | Status: DC
  Administered 2016-06-26 – 2016-06-28 (×4): 50 mg via ORAL
  Filled 2016-06-26 (×5): qty 2

## 2016-06-26 MED ORDER — INSULIN ASPART 100 UNIT/ML ~~LOC~~ SOLN
0.0000 [IU] | Freq: Three times a day (TID) | SUBCUTANEOUS | Status: DC
  Administered 2016-06-27 – 2016-06-28 (×3): 3 [IU] via SUBCUTANEOUS

## 2016-06-26 MED ORDER — INSULIN ASPART PROT & ASPART (70-30 MIX) 100 UNIT/ML ~~LOC~~ SUSP
40.0000 [IU] | Freq: Two times a day (BID) | SUBCUTANEOUS | Status: DC
  Administered 2016-06-26 – 2016-06-28 (×4): 40 [IU] via SUBCUTANEOUS
  Filled 2016-06-26: qty 10

## 2016-06-26 MED ORDER — INSULIN ASPART 100 UNIT/ML ~~LOC~~ SOLN
0.0000 [IU] | Freq: Every day | SUBCUTANEOUS | Status: DC
  Administered 2016-06-26: 4 [IU] via SUBCUTANEOUS

## 2016-06-26 MED ORDER — SODIUM CHLORIDE 0.9 % IV BOLUS (SEPSIS)
1000.0000 mL | Freq: Once | INTRAVENOUS | Status: AC
  Administered 2016-06-26: 1000 mL via INTRAVENOUS

## 2016-06-26 MED ORDER — LOPERAMIDE HCL 2 MG PO CAPS
2.0000 mg | ORAL_CAPSULE | Freq: Once | ORAL | Status: AC
Start: 2016-06-26 — End: 2016-06-26
  Administered 2016-06-26: 2 mg via ORAL
  Filled 2016-06-26: qty 1

## 2016-06-26 MED ORDER — ONDANSETRON HCL 4 MG/2ML IJ SOLN
4.0000 mg | Freq: Four times a day (QID) | INTRAMUSCULAR | Status: DC | PRN
  Administered 2016-06-26: 4 mg via INTRAVENOUS
  Filled 2016-06-26: qty 2

## 2016-06-26 MED ORDER — TACROLIMUS 0.5 MG PO CAPS: 0.5000 mg | ORAL_CAPSULE | Freq: Two times a day (BID) | ORAL | Status: DC

## 2016-06-26 MED ORDER — SENNOSIDES-DOCUSATE SODIUM 8.6-50 MG PO TABS: 1.0000 | ORAL_TABLET | Freq: Every evening | ORAL | Status: DC | PRN

## 2016-06-26 MED ORDER — ACETAMINOPHEN 650 MG RE SUPP: 650.0000 mg | Freq: Four times a day (QID) | RECTAL | Status: DC | PRN

## 2016-06-26 MED ORDER — INSULIN ASPART PROT & ASPART (70-30 MIX) 100 UNIT/ML ~~LOC~~ SUSP: 38.0000 [IU] | Freq: Two times a day (BID) | SUBCUTANEOUS | Status: DC

## 2016-06-26 MED ORDER — AMLODIPINE BESYLATE 5 MG PO TABS
5.0000 mg | ORAL_TABLET | Freq: Every day | ORAL | Status: DC
  Administered 2016-06-26 – 2016-06-28 (×3): 5 mg via ORAL
  Filled 2016-06-26 (×3): qty 1

## 2016-06-26 MED ORDER — PIPERACILLIN-TAZOBACTAM 3.375 G IVPB
3.3750 g | Freq: Three times a day (TID) | INTRAVENOUS | Status: DC
  Administered 2016-06-27 – 2016-06-28 (×4): 3.375 g via INTRAVENOUS
  Filled 2016-06-26 (×4): qty 50

## 2016-06-26 MED ORDER — ENOXAPARIN SODIUM 40 MG/0.4ML ~~LOC~~ SOLN
40.0000 mg | SUBCUTANEOUS | Status: DC
  Administered 2016-06-26 – 2016-06-29 (×4): 40 mg via SUBCUTANEOUS
  Filled 2016-06-26 (×4): qty 0.4

## 2016-06-26 MED ORDER — HYDROCODONE-ACETAMINOPHEN 5-325 MG PO TABS: 1.0000 | ORAL_TABLET | ORAL | Status: DC | PRN

## 2016-06-26 MED ORDER — ASPIRIN EC 81 MG PO TBEC
81.0000 mg | DELAYED_RELEASE_TABLET | Freq: Every day | ORAL | Status: DC
  Administered 2016-06-26 – 2016-06-30 (×5): 81 mg via ORAL
  Filled 2016-06-26 (×5): qty 1

## 2016-06-26 MED ORDER — VANCOMYCIN HCL IN DEXTROSE 1-5 GM/200ML-% IV SOLN
1000.0000 mg | Freq: Once | INTRAVENOUS | Status: AC
  Administered 2016-06-26: 1000 mg via INTRAVENOUS
  Filled 2016-06-26: qty 200

## 2016-06-26 MED ORDER — PIPERACILLIN-TAZOBACTAM 3.375 G IVPB 30 MIN
3.3750 g | Freq: Once | INTRAVENOUS | Status: AC
  Administered 2016-06-26: 3.375 g via INTRAVENOUS
  Filled 2016-06-26 (×2): qty 50

## 2016-06-26 MED ORDER — HYDROCODONE-ACETAMINOPHEN 5-325 MG PO TABS: 2.0000 | ORAL_TABLET | Freq: Four times a day (QID) | ORAL | Status: DC | PRN

## 2016-06-26 MED ORDER — HYDROCODONE-ACETAMINOPHEN 10-325 MG PO TABS
1.0000 | ORAL_TABLET | Freq: Once | ORAL | Status: AC
Start: 2016-06-26 — End: 2016-06-26
  Administered 2016-06-26: 1 via ORAL
  Filled 2016-06-26: qty 1

## 2016-06-26 MED ORDER — HYDROMORPHONE HCL 1 MG/ML IJ SOLN
1.0000 mg | Freq: Once | INTRAMUSCULAR | Status: AC
  Administered 2016-06-26: 1 mg via INTRAVENOUS
  Filled 2016-06-26: qty 1

## 2016-06-26 MED ORDER — SODIUM CHLORIDE 0.9 % IV SOLN
INTRAVENOUS | Status: DC
  Administered 2016-06-26 – 2016-06-27 (×3): via INTRAVENOUS

## 2016-06-26 MED ORDER — FAMOTIDINE 20 MG PO TABS
20.0000 mg | ORAL_TABLET | Freq: Two times a day (BID) | ORAL | Status: DC
  Administered 2016-06-26 – 2016-06-29 (×6): 20 mg via ORAL
  Filled 2016-06-26 (×9): qty 1

## 2016-06-26 MED ORDER — TACROLIMUS 0.5 MG PO CAPS
1.0000 mg | ORAL_CAPSULE | Freq: Every day | ORAL | Status: DC
  Administered 2016-06-26 – 2016-06-30 (×5): 1 mg via ORAL
  Filled 2016-06-26 (×5): qty 2

## 2016-06-26 MED ORDER — LEVOTHYROXINE SODIUM 137 MCG PO TABS
137.0000 ug | ORAL_TABLET | Freq: Every day | ORAL | Status: DC
  Administered 2016-06-26 – 2016-06-30 (×5): 137 ug via ORAL
  Filled 2016-06-26 (×6): qty 1

## 2016-06-26 MED ORDER — ACETAMINOPHEN 325 MG PO TABS: 650.0000 mg | ORAL_TABLET | Freq: Four times a day (QID) | ORAL | Status: DC | PRN

## 2016-06-26 MED ORDER — FUROSEMIDE 40 MG PO TABS
40.0000 mg | ORAL_TABLET | Freq: Every day | ORAL | Status: DC
  Administered 2016-06-26 – 2016-06-28 (×3): 40 mg via ORAL
  Filled 2016-06-26 (×3): qty 1

## 2016-06-26 MED ORDER — INSULIN ASPART 100 UNIT/ML ~~LOC~~ SOLN
0.0000 [IU] | Freq: Every day | SUBCUTANEOUS | Status: DC
  Administered 2016-06-27: 3 [IU] via SUBCUTANEOUS

## 2016-06-26 MED ORDER — SODIUM CHLORIDE 0.9 % IV BOLUS (SEPSIS): 1000.0000 mL | Freq: Once | INTRAVENOUS | Status: DC

## 2016-06-26 MED ORDER — PROCHLORPERAZINE EDISYLATE 5 MG/ML IJ SOLN
10.0000 mg | INTRAMUSCULAR | Status: DC | PRN
  Administered 2016-06-26 – 2016-06-27 (×3): 10 mg via INTRAVENOUS
  Filled 2016-06-26 (×3): qty 2

## 2016-06-26 MED ORDER — VANCOMYCIN HCL 10 G IV SOLR
1500.0000 mg | INTRAVENOUS | Status: DC
  Administered 2016-06-27 – 2016-06-28 (×2): 1500 mg via INTRAVENOUS
  Filled 2016-06-26 (×2): qty 1500

## 2016-06-26 MED ORDER — SODIUM BICARBONATE 650 MG PO TABS
650.0000 mg | ORAL_TABLET | Freq: Every day | ORAL | Status: DC
  Administered 2016-06-26 – 2016-06-27 (×2): 650 mg via ORAL
  Filled 2016-06-26 (×2): qty 1

## 2016-06-26 MED ORDER — TACROLIMUS 0.5 MG PO CAPS
0.5000 mg | ORAL_CAPSULE | Freq: Every day | ORAL | Status: DC
  Administered 2016-06-27 – 2016-06-29 (×3): 0.5 mg via ORAL
  Filled 2016-06-26 (×6): qty 1

## 2016-06-26 MED ORDER — PREDNISONE 5 MG PO TABS
5.0000 mg | ORAL_TABLET | Freq: Every day | ORAL | Status: DC
  Administered 2016-06-26 – 2016-06-29 (×4): 5 mg via ORAL
  Filled 2016-06-26 (×4): qty 1

## 2016-06-26 NOTE — ED Notes (Signed)
Bed: ZO10WA16 Expected date:  Expected time:  Means of arrival:  Comments: EMS- 50yo F, hyperglycemia/leg wound

## 2016-06-26 NOTE — Progress Notes (Signed)
Pharmacy Antibiotic Note  [name] is a 50 y.o. female with Hx renal txplant on TAC/MMA/prednisone, uncontrolled DM, hypothyroidism, admitted on 06/26/2016 with R calf ulcer which started ~1 month ago but surrounding has recently become red and painful.  Pharmacy has been consulted for vancomycin and Zosyn dosing.  Plan:  Vancomycin 1 mg IV now, then 1500 mg IV q24 hr; goal trough 10-15 mcg/mL  Measure vancomycin trough levels at steady state as indicated  Zosyn 3.375 g IV given once over 30 minutes, then every 8 hrs by 4-hr infusion  Follow clinical course, renal function, culture results as available  Follow for de-escalation of antibiotics and LOT   Height: 5\' 7"  (170.2 cm) Weight: 195 lb (88.5 kg) IBW/kg (Calculated) : 61.6  Temp (24hrs), Avg:97.5 F (36.4 C), Min:97.4 F (36.3 C), Max:97.6 F (36.4 C)   Recent Labs Lab 06/26/16 1053 06/26/16 1119  WBC 7.3  --   CREATININE 1.39*  --   LATICACIDVEN  --  1.28    Estimated Creatinine Clearance: 56 mL/min (by C-G formula based on SCr of 1.39 mg/dL (H)).    Allergies  Allergen Reactions  . Ace Inhibitors Cough  . Adhesive [Tape] Itching and Other (See Comments)    Please use paper tape  . Lisinopril Cough    Antimicrobials this admission: vancomycin 10/20 >>  Zosyn 10/20 >>   Dose adjustments this admission: ---  Microbiology results: 10/20 BCx: sent  Thank you for allowing pharmacy to be a part of this patient's care.  Bernadene Personrew Paisely Brick, PharmD, BCPS Pager: [phone] 06/26/2016, 9:24 PM

## 2016-06-26 NOTE — ED Triage Notes (Addendum)
Patient comes to ED via EMS for hyperglycemia despite insulin. Glucose has been high since yesterday evening. Patient also complaining of right lower leg pain, patient does have a open wound on calf unsure of how long its been there (has hx of MRSA infection). PCP has been managing it by keeping clean and neosporin. Worried it's infected. Patient complaining of generalized abdominal pain and decreased appetite. Hx of kidney transplant, brittle diabetes, loss of toes. Glucose per EMS in route was >600, EMS gave pt NS in route Glucose was 585 after fluids. Patient has not had any medications today or insulin. Last dose of insulin was last night 30U of 70/30.

## 2016-06-26 NOTE — ED Provider Notes (Signed)
[facility]-EMERGENCY DEPT Provider Note   CSN: [id number] Arrival date & time: 06/26/16  1023     History   Chief Complaint Chief Complaint  Patient presents with  . Hyperglycemia  . Wound Check    HPI [name] is a 50 y.o. 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 Patient states that her blood sugars have been running very high, over the past 2 days despite insulin correction. She has had generalized malaise and has pain in the right leg where she is an ulceration on the posterior right calf. She states she has been seeing her primary care provider for wound management. Been using Neosporin and keeping it clean, however, it turned red, painful over the past 2 days and she thinks she is infection. She denies fevers, chills, difficulty breathing, myalgias. HPI  Past Medical History:  Diagnosis Date  . Arthritis    "elbows, knees, legs, back" (09/24/2015)  . Daily headache   . Depression    "years ago"  . End stage renal disease (HCC)    right arm AV graft, post transplant  . Hypertension   . Hypothyroid   . 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 Kidney disease   . Pneumonia ~ 2007?  [name]. Type II diabetes mellitus (HCC)    Insulin dependant    Patient Active Problem List   Diagnosis Date Noted  . Wound cellulitis   . CKD (chronic kidney disease) stage 3, GFR 30-59 ml/min 06/26/2016  . Cellulitis 06/26/2016  . Cellulitis of right lower extremity   . Hyperglycemia   . QT prolongation 05/13/2016  . UTI (lower urinary tract infection)   . Hypomagnesemia   . Pressure ulcer 04/11/2016  . Acute encephalopathy 04/11/2016  . Altered mental state 04/11/2016  . Altered mental status 04/10/2016  . Insulin dependent diabetes mellitus (HCC) 04/10/2016  . DKA (diabetic ketoacidoses) (HCC)  11/02/2015  . Hyperglycemia without ketosis 09/24/2015  . CKD (chronic kidney disease) stage 2, GFR 60-89 ml/min 09/24/2015  . History of renal transplant 09/24/2015  . Uncontrolled type 1 diabetes mellitus with foot ulcer (HCC) 06/04/2015  . History of renal transplantation 06/04/2015  . Foot ulcer, right (HCC)   . Foot abscess, right   . Diarrhea 06/02/2015  . DM (diabetes mellitus) type 2, uncontrolled, with ketoacidosis (HCC) 06/02/2015  . Type 1 diabetes mellitus with diabetic foot ulcer (HCC) 06/02/2015  . Acute renal failure superimposed on stage 3 chronic kidney disease (HCC) 06/02/2015  . Diabetic foot infection (HCC) 06/01/2015  . Essential hypertension 06/01/2015  . GERD (gastroesophageal reflux disease) 06/01/2015  . End stage renal disease (HCC) 10/24/2012  . H/O kidney transplant 08/04/2012  . High anion gap metabolic acidosis 08/04/2012  . Sepsis (HCC) 08/04/2012  . DKA, type 1 (HCC) 09/22/2011  . Hyperkalemia 09/22/2011  . Immunosuppression (HCC)   . Hypothyroidism     Past Surgical History:  Procedure Laterality Date  . AMPUTATION Left 05/11/2014   Procedure: AMPUTATION LEFT GREAT TOE;  Surgeon: Kathryne Hitchhristopher Y Blackman, MD;  Location: [facility];  Service: Orthopedics;  Laterality: Left;  . AV FISTULA PLACEMENT Right    forearm  . BACK SURGERY    . CATARACT EXTRACTION W/ INTRAOCULAR LENS  IMPLANT, BILATERAL Bilateral   . CESAREAN SECTION  07/1999  . DG AV DIALYSIS GRAFT DECLOT OR    .  INCISION AND DRAINAGE Right 06/02/2015   Procedure: INCISION AND DRAINAGE OF RIGHT 3rd RAY RESECTION;  Surgeon: Kathryne Hitch, MD;  Location: [facility];  Service: Orthopedics;  Laterality: Right;  . 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 LUMBAR DISC SURGERY  2001  . NEPHRECTOMY TRANSPLANTED ORGAN    . TOE AMPUTATION Right    1,2 & 3rd toes.  . TUBAL LIGATION  07/1999    OB History    No data available       Home Medications    Prior to Admission medications     Medication Sig Start Date End Date Taking? Authorizing Provider  acetaminophen (TYLENOL) 325 MG tablet Take 2 tablets (650 mg total) by mouth every 6 (six) hours as needed for mild pain, moderate pain, fever or headache. 05/15/16  Yes Elease Etienne, MD  amLODipine (NORVASC) 5 MG tablet Take 5 mg by mouth daily.   Yes Historical Provider, MD  aspirin EC 81 MG tablet Take 81 mg by mouth daily.   Yes Historical Provider, MD  carvedilol (COREG) 25 MG tablet Take 2 tablets (50 mg total) by mouth 2 (two) times daily. 05/15/16  Yes Elease Etienne, MD  insulin aspart (NOVOLOG) 100 UNIT/ML injection Before each meal 3 times a day, 140-199 - 2 units, 200-250 - 4 units, 251-299 - 6 units,  300-349 - 8 units,  350 or above 10 units. Insulin PEN if approved, provide syringes and needles if needed. 04/18/16  Yes Albertine Grates, MD  insulin NPH-regular Human (NOVOLIN 70/30) (70-30) 100 UNIT/ML injection Inject 22 Units into the skin 2 (two) times daily with a meal. Patient taking differently: Inject 30 Units into the skin 2 (two) times daily with a meal.  04/17/16  Yes Albertine Grates, MD  levothyroxine (SYNTHROID, LEVOTHROID) 137 MCG tablet Take 1 tablet (137 mcg total) by mouth daily. BRAND NAME ONLY 03/04/15  Yes Alison Murray, MD  mycophenolate (CELLCEPT) 250 MG capsule Take 500 mg by mouth 2 (two) times daily.    Yes Historical Provider, MD  predniSONE (DELTASONE) 5 MG tablet Take 5 mg by mouth daily. 05/29/15  Yes Historical Provider, MD  ranitidine (ZANTAC) 150 MG tablet Take 150 mg by mouth daily. 03/27/16  Yes Historical Provider, MD  tacrolimus (PROGRAF) 0.5 MG capsule Take 0.5 mg by mouth every morning.    Yes Historical Provider, MD  tacrolimus (PROGRAF) 1 MG capsule Take 1 mg by mouth at bedtime.   Yes Historical Provider, MD  levothyroxine (SYNTHROID, LEVOTHROID) 137 MCG tablet Take by mouth. 06/17/16 07/17/16  Historical Provider, MD  SSD 1 % cream Apply 1 application topically daily.  06/10/16   Historical Provider,  MD  tacrolimus (PROGRAF) 0.5 MG capsule TAKE 2 CAPSULES BY MOUTH EVERY MORNING AND TAKE 1 CAPSULE BY MOUTH EACH EVENING 06/17/16   Historical Provider, MD    Family History Family History  Problem Relation Age of Onset  . Emphysema Mother   . Throat cancer Mother   . COPD Mother   . Cancer Mother   . Emphysema Father   . COPD Father   . Stroke Father   . ADD / ADHD Son     Social History Social History  Substance Use Topics  . Smoking status: Former Smoker    Packs/day: 1.00    Years: 11.00    Types: Cigarettes    Quit date: 09/21/1998  . Smokeless tobacco: Never Used  . Alcohol use No     Allergies  Ace inhibitors; Adhesive [tape]; and Lisinopril   Review of Systems Review of Systems  Ten systems reviewed and are negative for acute change, except as noted in the HPI.   Physical Exam Updated Vital Signs BP 94/60 (BP Location: Right Arm)   Pulse 78   Temp 97.8 F (36.6 C) (Oral)   Resp 16   Ht 5\' 7"  (1.702 m)   Wt 88.5 kg   LMP 11/19/2011   SpO2 100%   BMI 30.54 kg/m   Physical Exam  Constitutional: She is oriented to person, place, and time. She appears well-developed and well-nourished. No distress.  HENT:  Head: Normocephalic and atraumatic.  Eyes: Conjunctivae are normal. No scleral icterus.  Neck: Normal range of motion.  Cardiovascular: Normal rate, regular rhythm and normal heart sounds.  Exam reveals no gallop and no friction rub.   No murmur heard. Strong DP and PT pulses.  Pulmonary/Chest: Effort normal and breath sounds normal. No respiratory distress.  Abdominal: Soft. Bowel sounds are normal. She exhibits no distension and no mass. There is no tenderness. There is no guarding.  Musculoskeletal:  Multiple surgically move toes appear well healed  Neurological: She is alert and oriented to person, place, and time.  Atrophy of the muscular tissue of the bilateral thighs  Skin: Skin is warm. She is not diaphoretic.  Ulceration of the  posterior right calf approximately 5 cm in diameter, stage III or 4 with a slight loss, surrounding erythema, exquisitely tender without lymphangitis  Nursing note and vitals reviewed.    ED Treatments / Results  Labs (all labs ordered are listed, but only abnormal results are displayed) Labs Reviewed  BASIC METABOLIC PANEL - Abnormal; Notable for the following:       Result Value   Sodium 132 (*)    CO2 21 (*)    Glucose, Bld 560 (*)    BUN 35 (*)    Creatinine, Ser 1.39 (*)    GFR calc non Af Amer 44 (*)    GFR calc Af Amer 51 (*)    All other components within normal limits  CBC - Abnormal; Notable for the following:    Hemoglobin 11.3 (*)    HCT 35.9 (*)    All other components within normal limits  URINALYSIS, ROUTINE W REFLEX MICROSCOPIC (NOT AT [facility]) - Abnormal; Notable for the following:    Glucose, UA >1000 (*)    Hgb urine dipstick TRACE (*)    Ketones, ur 15 (*)    Protein, ur 100 (*)    All other components within normal limits  URINE MICROSCOPIC-ADD ON - Abnormal; Notable for the following:    Squamous Epithelial / LPF 0-5 (*)    Bacteria, UA RARE (*)    All other components within normal limits  GLUCOSE, CAPILLARY - Abnormal; Notable for the following:    Glucose-Capillary 451 (*)    All other components within normal limits  GLUCOSE, CAPILLARY - Abnormal; Notable for the following:    Glucose-Capillary 343 (*)    All other components within normal limits  GLUCOSE, CAPILLARY - Abnormal; Notable for the following:    Glucose-Capillary 198 (*)    All other components within normal limits  BASIC METABOLIC PANEL - Abnormal; Notable for the following:    Chloride 113 (*)    CO2 18 (*)    Glucose, Bld 221 (*)    BUN 30 (*)    Creatinine, Ser 1.79 (*)    GFR calc non Af Denyse Dago  32 (*)    GFR calc Af Amer 37 (*)    All other components within normal limits  CBC WITH DIFFERENTIAL/PLATELET - Abnormal; Notable for the following:    Lymphs Abs 0.2 (*)    All other  components within normal limits  MAGNESIUM - Abnormal; Notable for the following:    Magnesium 1.3 (*)    All other components within normal limits  GLUCOSE, CAPILLARY - Abnormal; Notable for the following:    Glucose-Capillary 173 (*)    All other components within normal limits  GLUCOSE, RANDOM - Abnormal; Notable for the following:    Glucose, Bld 50 (*)    All other components within normal limits  GLUCOSE, CAPILLARY - Abnormal; Notable for the following:    Glucose-Capillary 45 (*)    All other components within normal limits  GLUCOSE, CAPILLARY - Abnormal; Notable for the following:    Glucose-Capillary 113 (*)    All other components within normal limits  CBG MONITORING, ED - Abnormal; Notable for the following:    Glucose-Capillary 516 (*)    All other components within normal limits  CBG MONITORING, ED - Abnormal; Notable for the following:    Glucose-Capillary 458 (*)    All other components within normal limits  CULTURE, BLOOD (ROUTINE X 2)  MRSA PCR SCREENING  AEROBIC CULTURE (SUPERFICIAL SPECIMEN)  BODY FLUID CULTURE  C DIFFICILE QUICK SCREEN W PCR REFLEX  GLUCOSE, CAPILLARY  GLUCOSE, CAPILLARY  BASIC METABOLIC PANEL  CBC WITH DIFFERENTIAL/PLATELET  SEDIMENTATION RATE  C-REACTIVE PROTEIN  FECAL LACTOFERRIN, QUANT  I-STAT CG4 LACTIC ACID, ED    EKG  EKG Interpretation None       Radiology Dg Tibia/fibula Right  Result Date: 06/27/2016 CLINICAL DATA:  Wound cellulitis.  Rule out osteomyelitis. EXAM: RIGHT TIBIA AND FIBULA - 2 VIEW COMPARISON:  05/27/2011 FINDINGS: Again noted are skin densities or calcifications along the lateral aspect of the lower leg. There is no evidence for a fracture or dislocation. Diffuse vascular calcifications. No evidence for periosteal reaction or cortical destruction. IMPRESSION: No acute abnormality. Electronically Signed   By: Richarda Overlie M.D.   On: 06/27/2016 13:42    Procedures Procedures (including critical care  time)  Medications Ordered in ED Medications  sodium chloride 0.9 % bolus 1,000 mL (1,000 mLs Intravenous New Bag/Given 06/26/16 1342)    And  sodium chloride 0.9 % bolus 1,000 mL (not administered)    And  0.9 %  sodium chloride infusion ( Intravenous New Bag/Given 06/27/16 1944)  amLODipine (NORVASC) tablet 5 mg (5 mg Oral Given 06/27/16 0937)  aspirin EC tablet 81 mg (81 mg Oral Given 06/27/16 0937)  carvedilol (COREG) tablet 50 mg (50 mg Oral Given 06/27/16 0938)  furosemide (LASIX) tablet 40 mg (40 mg Oral Given 06/27/16 0944)  synthroid (brand name) (137 mcg Oral Given 06/27/16 0939)  mycophenolate (CELLCEPT) capsule 500 mg (500 mg Oral Given 06/27/16 0939)  predniSONE (DELTASONE) tablet 5 mg (5 mg Oral Given 06/27/16 0938)  famotidine (PEPCID) tablet 20 mg (20 mg Oral Given 06/27/16 0938)  sodium bicarbonate tablet 650 mg (650 mg Oral Given 06/27/16 0937)  insulin aspart (novoLOG) injection 0-15 Units (3 Units Subcutaneous Given 06/27/16 1200)  enoxaparin (LOVENOX) injection 40 mg (40 mg Subcutaneous Given 06/27/16 1825)  acetaminophen (TYLENOL) tablet 650 mg (not administered)    Or  acetaminophen (TYLENOL) suppository 650 mg (not administered)  senna-docusate (Senokot-S) tablet 1 tablet (not administered)  tacrolimus (PROGRAF) capsule 1 mg (1 mg Oral  Given 06/27/16 0939)  tacrolimus (PROGRAF) capsule 0.5 mg (0.5 mg Oral Not Given 06/26/16 2315)  insulin aspart protamine- aspart (NOVOLOG MIX 70/30) injection 40 Units (40 Units Subcutaneous Not Given 06/27/16 1821)  HYDROcodone-acetaminophen (NORCO/VICODIN) 5-325 MG per tablet 1-2 tablet (2 tablets Oral Given 06/26/16 1825)  piperacillin-tazobactam (ZOSYN) IVPB 3.375 g (3.375 g Intravenous Given 06/27/16 2037)  vancomycin (VANCOCIN) 1,500 mg in sodium chloride 0.9 % 500 mL IVPB (1,500 mg Intravenous Given 06/27/16 0423)  insulin aspart (novoLOG) injection 0-15 Units (0 Units Subcutaneous Not Given 06/27/16 1742)  insulin aspart  (novoLOG) injection 0-5 Units (4 Units Subcutaneous Given 06/26/16 2238)  prochlorperazine (COMPAZINE) injection 10 mg (10 mg Intravenous Given 06/27/16 0422)  cholestyramine (QUESTRAN) packet 4 g (not administered)  sodium chloride 0.9 % bolus 1,000 mL (0 mLs Intravenous Stopped 06/26/16 1336)  vancomycin (VANCOCIN) IVPB 1000 mg/200 mL premix (0 mg Intravenous Stopped 06/26/16 1457)  HYDROmorphone (DILAUDID) injection 1 mg (1 mg Intravenous Given 06/26/16 1341)  piperacillin-tazobactam (ZOSYN) IVPB 3.375 g (3.375 g Intravenous Given 06/26/16 2051)  HYDROcodone-acetaminophen (NORCO) 10-325 MG per tablet 1 tablet (1 tablet Oral Given 06/26/16 2249)  loperamide (IMODIUM) capsule 2 mg (2 mg Oral Given 06/26/16 2249)  magnesium sulfate IVPB 4 g 100 mL (4 g Intravenous Given 06/27/16 1355)     Initial Impression / Assessment and Plan / ED Course  I have reviewed the triage vital signs and the nursing notes.  Pertinent labs & imaging results that were available during my care of the patient were reviewed by me and considered in my medical decision making (see chart for details).  Clinical Course     Patient with Final Clinical Impressions(s) / ED Diagnoses   Final diagnoses:  Cellulitis of right lower extremity  Hyperglycemia    New Prescriptions Current Discharge Medication List       Arthor Captain, PA-C 06/27/16 2106    Lavera Guise, MD 06/28/16 [phone]

## 2016-06-26 NOTE — ED Notes (Signed)
MD at bedside. 

## 2016-06-26 NOTE — ED Notes (Signed)
PA at bedside.

## 2016-06-26 NOTE — Hospital Discharge Follow-Up (Deleted)
History and Physical    [name] WGN:[id number] DOB: 05-12-1966 DOA: 06/26/2016    PCP: Dois Davenport., MD  Patient coming from: home  Chief Complaint: Pain and swelling around ulcer  HPI: [name] is a 50 y.o. female with medical history significant of renal transplant (2011) HTN, DM poorly controlled, hypothryoidism who fell in September and sustained an injury to her right calf which developed into an ulcer. She states her doctor has been helping to take care of it but over the past couple of days she's noticed the surrounding skin around the ulcer has become more red and painful. She hasn't noticed any discharge. No complaint of fever or chills. Her sugars are also noted to be elevated in the 500s. She states that they typically run in the 400s despite taking her medications appropriately.  ED Course: Glucose 560, BUN 35, Cr 1.39, CO2 21, Hb 11.3  Review of Systems:  -Weight loss of about 20 pounds in the past few months Has on and off leg swelling, heartburn, frequent headaches -Easy bruising All other systems reviewed and apart from HPI, are negative.  Past Medical History:  Diagnosis Date  . Arthritis    "elbows, knees, legs, back" (09/24/2015)  . Daily headache   . Depression    "years ago"  . End stage renal disease (HCC)    right arm AV graft, post transplant  . Hypertension   . Hypothyroid   . 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 Kidney disease   . Pneumonia ~ 2007?  [name] Type II diabetes mellitus (HCC)    Insulin dependant    Past Surgical History:  Procedure Laterality Date  . AMPUTATION Left 05/11/2014   Procedure: AMPUTATION LEFT GREAT TOE;  Surgeon: Kathryne Hitch, MD;  Location: [facility];  Service: Orthopedics;  Laterality: Left;  . AV FISTULA PLACEMENT Right    forearm  . BACK SURGERY    . CATARACT EXTRACTION W/ INTRAOCULAR LENS  IMPLANT, BILATERAL Bilateral   . CESAREAN SECTION  07/1999  . DG AV DIALYSIS  GRAFT DECLOT OR    . INCISION AND DRAINAGE Right 06/02/2015   Procedure: INCISION AND DRAINAGE OF RIGHT 3rd RAY RESECTION;  Surgeon: Kathryne Hitch, MD;  Location: [facility];  Service: Orthopedics;  Laterality: Right;  . 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 LUMBAR DISC SURGERY  2001  . NEPHRECTOMY TRANSPLANTED ORGAN    . TOE AMPUTATION Right    1,2 & 3rd toes.  . TUBAL LIGATION  07/1999    Social History:   reports that she quit smoking about 17 years ago. Her smoking use included Cigarettes. She has a 11.00 pack-year smoking history. She has never used smokeless tobacco. She reports that she does not drink alcohol or use drugs.  Allergies  Allergen Reactions  . Ace Inhibitors Cough  . Adhesive [Tape] Itching and Other (See Comments)    Please use paper tape  . Lisinopril Cough    Family History  Problem Relation Age of Onset  . Emphysema Mother   . Throat cancer Mother   . COPD Mother   . Cancer Mother   . Emphysema Father   . COPD Father   . Stroke Father   . ADD / ADHD Son      Prior to Admission medications   Medication Sig Start Date End Date Taking? Authorizing Provider  acetaminophen (TYLENOL) 325 MG tablet Take 2 tablets (650 mg total) by mouth  every 6 (six) hours as needed for mild pain, moderate pain, fever or headache. 05/15/16  Yes Elease EtienneAnand D Hongalgi, MD  amLODipine (NORVASC) 5 MG tablet Take 5 mg by mouth daily.   Yes Historical Provider, MD  aspirin EC 81 MG tablet Take 81 mg by mouth daily.   Yes Historical Provider, MD  carvedilol (COREG) 25 MG tablet Take 2 tablets (50 mg total) by mouth 2 (two) times daily. 05/15/16  Yes Elease EtienneAnand D Hongalgi, MD  furosemide (LASIX) 40 MG tablet Take 40 mg by mouth daily.   Yes Historical Provider, MD  insulin aspart (NOVOLOG) 100 UNIT/ML injection Before each meal 3 times a day, 140-199 - 2 units, 200-250 - 4 units, 251-299 - 6 units,  300-349 - 8 units,  350 or above 10 units. Insulin PEN if approved, provide syringes and  needles if needed. 04/18/16  Yes Albertine GratesFang Xu, MD  insulin NPH-regular Human (NOVOLIN 70/30) (70-30) 100 UNIT/ML injection Inject 22 Units into the skin 2 (two) times daily with a meal. Patient taking differently: Inject 30 Units into the skin 2 (two) times daily with a meal.  04/17/16  Yes Albertine GratesFang Xu, MD  levothyroxine (SYNTHROID, LEVOTHROID) 137 MCG tablet Take 1 tablet (137 mcg total) by mouth daily. BRAND NAME ONLY 03/04/15  Yes Alison MurrayAlma M Devine, MD  mycophenolate (CELLCEPT) 250 MG capsule Take 500 mg by mouth 2 (two) times daily.    Yes Historical Provider, MD  predniSONE (DELTASONE) 5 MG tablet Take 5 mg by mouth daily. 05/29/15  Yes Historical Provider, MD  ranitidine (ZANTAC) 150 MG tablet Take 150 mg by mouth daily. 03/27/16  Yes Historical Provider, MD  sodium bicarbonate 650 MG tablet Take 650 mg by mouth daily.   Yes Historical Provider, MD  tacrolimus (PROGRAF) 0.5 MG capsule Take 0.5-1 mg by mouth 2 (two) times daily. Takes 1mg  in the morning and 0.5mg  at night   Yes Historical Provider, MD  metFORMIN (GLUCOPHAGE-XR) 500 MG 24 hr tablet Take 500 mg by mouth daily with breakfast.  01/16/16   Historical Provider, MD    Physical Exam: Vitals:   06/26/16 1030 06/26/16 1035 06/26/16 1036 06/26/16 1313  BP:  167/70  162/72  Pulse:  79  77  Resp:  18  16  Temp:  97.6 F (36.4 C)  97.4 F (36.3 C)  TempSrc:  Oral  Oral  SpO2: 97% 96%  97%  Weight:   88 kg (194 lb)   Height:   5\' 7"  (1.702 m)       Constitutional: NAD, calm, comfortable Eyes: PERTLA, lids and conjunctivae normal ENMT: Mucous membranes are moist. Posterior pharynx clear of any exudate or lesions. Normal dentition.  Neck: normal, supple, no masses, no thyromegaly Respiratory: clear to auscultation bilaterally, no wheezing, no crackles. Normal respiratory effort. No accessory muscle use.  Cardiovascular: S1 & S2 heard, regular rate and rhythm, no murmurs / rubs / gallops. No extremity edema. 2+ pedal pulses. No carotid bruits.    Abdomen: No distension, no tenderness, no masses palpated. No hepatosplenomegaly. Bowel sounds normal.  Musculoskeletal: no clubbing / cyanosis. No joint deformity upper and lower extremities. Good ROM, no contractures. Normal muscle tone.  Skin: no rashes, lesions, ulcers. No induration Neurologic: CN 2-12 grossly intact. Sensation intact, DTR normal. Strength 5/5 in all 4 limbs.  Psychiatric: Normal judgment and insight. Alert and oriented x 3. Normal mood.     Labs on Admission: I have personally reviewed following labs and imaging studies  CBC:  Recent Labs Lab 06/26/16 1053  WBC 7.3  HGB 11.3*  HCT 35.9*  MCV 89.1  PLT 196   Basic Metabolic Panel:  Recent Labs Lab 06/26/16 1053  NA 132*  K 4.2  CL 102  CO2 21*  GLUCOSE 560*  BUN 35*  CREATININE 1.39*  CALCIUM 9.7   GFR: Estimated Creatinine Clearance: 55.8 mL/min (by C-G formula based on SCr of 1.39 mg/dL (H)). Liver Function Tests: No results for input(s): AST, ALT, ALKPHOS, BILITOT, PROT, ALBUMIN in the last 168 hours. No results for input(s): LIPASE, AMYLASE in the last 168 hours. No results for input(s): AMMONIA in the last 168 hours. Coagulation Profile: No results for input(s): INR, PROTIME in the last 168 hours. Cardiac Enzymes: No results for input(s): CKTOTAL, CKMB, CKMBINDEX, TROPONINI in the last 168 hours. BNP (last 3 results) No results for input(s): PROBNP in the last 8760 hours. HbA1C: No results for input(s): HGBA1C in the last 72 hours. CBG:  Recent Labs Lab 06/26/16 1113  GLUCAP 516*   Lipid Profile: No results for input(s): CHOL, HDL, LDLCALC, TRIG, CHOLHDL, LDLDIRECT in the last 72 hours. Thyroid Function Tests: No results for input(s): TSH, T4TOTAL, FREET4, T3FREE, THYROIDAB in the last 72 hours. Anemia Panel: No results for input(s): VITAMINB12, FOLATE, FERRITIN, TIBC, IRON, RETICCTPCT in the last 72 hours. Urine analysis:    Component Value Date/Time   COLORURINE YELLOW  06/26/2016 1049   APPEARANCEUR CLEAR 06/26/2016 1049   LABSPEC 1.025 06/26/2016 1049   PHURINE 5.5 06/26/2016 1049   GLUCOSEU >1000 (A) 06/26/2016 1049   HGBUR TRACE (A) 06/26/2016 1049   BILIRUBINUR NEGATIVE 06/26/2016 1049   KETONESUR 15 (A) 06/26/2016 1049   PROTEINUR 100 (A) 06/26/2016 1049   UROBILINOGEN 0.2 03/02/2015 1153   NITRITE NEGATIVE 06/26/2016 1049   LEUKOCYTESUR NEGATIVE 06/26/2016 1049   Sepsis Labs: @LABRCNTIP (procalcitonin:4,lacticidven:4) )No results found for this or any previous visit (from the past 240 hour(s)).   Radiological Exams on Admission: No results found.    Assessment/Plan Principal Problem:   Cellulitis -She is immune compromised and an uncontrolled diabetic, have started vancomycin and Zosyn -We will obtain wound care eval -recommend that she has outpatient care and wound care center once she is discharged  Active Problems:   Insulin dependent diabetes mellitus -Uncontrolled/brittle diabetes per patient -On 70/30 at home which I will resume at a higher dose of 40 mg BID instead of 30- adjust dose as needed - will only use sliding scale at lunch    Hypothyroidism - synthroid    H/O kidney transplant - continue immunosuppressants     CKD (chronic kidney disease) stage 3, GFR 30-59 ml/min- mild metabolic acidosis - cont Bicarb tabls  HTN - Norvasc, Coreg, lasix  GERD - Zantac at home- Pepcid while here    DVT prophylaxis: lovenox Code Status: Full code  Family Communication:   Disposition Plan: admit to med/surg  Consults called:   Admission status: observation    Jerelene Salaam MD [facility] Pager: [email or web address] Password [email or web address] 7PM-7AM, please contact night-coverage   06/26/2016, 2:31 PM

## 2016-06-26 NOTE — ED Notes (Signed)
Patient ambulated to restroom without difficulty.

## 2016-06-27 ENCOUNTER — Observation Stay (HOSPITAL_COMMUNITY): Payer: Medicare Other

## 2016-06-27 DIAGNOSIS — L03115 Cellulitis of right lower limb: Secondary | ICD-10-CM

## 2016-06-27 DIAGNOSIS — L97219 Non-pressure chronic ulcer of right calf with unspecified severity: Secondary | ICD-10-CM | POA: Diagnosis present

## 2016-06-27 DIAGNOSIS — N183 Chronic kidney disease, stage 3 (moderate): Secondary | ICD-10-CM | POA: Diagnosis not present

## 2016-06-27 DIAGNOSIS — Z91048 Other nonmedicinal substance allergy status: Secondary | ICD-10-CM | POA: Diagnosis not present

## 2016-06-27 DIAGNOSIS — N186 End stage renal disease: Secondary | ICD-10-CM | POA: Diagnosis present

## 2016-06-27 DIAGNOSIS — Z794 Long term (current) use of insulin: Secondary | ICD-10-CM | POA: Diagnosis not present

## 2016-06-27 DIAGNOSIS — Z940 Kidney transplant status: Secondary | ICD-10-CM | POA: Diagnosis not present

## 2016-06-27 DIAGNOSIS — Z87891 Personal history of nicotine dependence: Secondary | ICD-10-CM | POA: Diagnosis not present

## 2016-06-27 DIAGNOSIS — E039 Hypothyroidism, unspecified: Secondary | ICD-10-CM | POA: Diagnosis present

## 2016-06-27 DIAGNOSIS — R739 Hyperglycemia, unspecified: Secondary | ICD-10-CM | POA: Diagnosis present

## 2016-06-27 DIAGNOSIS — Z9181 History of falling: Secondary | ICD-10-CM | POA: Diagnosis not present

## 2016-06-27 DIAGNOSIS — E1065 Type 1 diabetes mellitus with hyperglycemia: Secondary | ICD-10-CM | POA: Diagnosis present

## 2016-06-27 DIAGNOSIS — Z7952 Long term (current) use of systemic steroids: Secondary | ICD-10-CM | POA: Diagnosis not present

## 2016-06-27 DIAGNOSIS — E669 Obesity, unspecified: Secondary | ICD-10-CM | POA: Diagnosis present

## 2016-06-27 DIAGNOSIS — Z7982 Long term (current) use of aspirin: Secondary | ICD-10-CM | POA: Diagnosis not present

## 2016-06-27 DIAGNOSIS — K219 Gastro-esophageal reflux disease without esophagitis: Secondary | ICD-10-CM | POA: Diagnosis present

## 2016-06-27 DIAGNOSIS — E10621 Type 1 diabetes mellitus with foot ulcer: Secondary | ICD-10-CM | POA: Diagnosis present

## 2016-06-27 DIAGNOSIS — Z89422 Acquired absence of other left toe(s): Secondary | ICD-10-CM | POA: Diagnosis not present

## 2016-06-27 DIAGNOSIS — I120 Hypertensive chronic kidney disease with stage 5 chronic kidney disease or end stage renal disease: Secondary | ICD-10-CM | POA: Diagnosis present

## 2016-06-27 DIAGNOSIS — L97509 Non-pressure chronic ulcer of other part of unspecified foot with unspecified severity: Secondary | ICD-10-CM

## 2016-06-27 DIAGNOSIS — Z79899 Other long term (current) drug therapy: Secondary | ICD-10-CM | POA: Diagnosis not present

## 2016-06-27 DIAGNOSIS — E10649 Type 1 diabetes mellitus with hypoglycemia without coma: Secondary | ICD-10-CM | POA: Diagnosis not present

## 2016-06-27 DIAGNOSIS — Z888 Allergy status to other drugs, medicaments and biological substances status: Secondary | ICD-10-CM | POA: Diagnosis not present

## 2016-06-27 DIAGNOSIS — E10628 Type 1 diabetes mellitus with other skin complications: Secondary | ICD-10-CM | POA: Diagnosis present

## 2016-06-27 DIAGNOSIS — Z6830 Body mass index (BMI) 30.0-30.9, adult: Secondary | ICD-10-CM | POA: Diagnosis not present

## 2016-06-27 DIAGNOSIS — N179 Acute kidney failure, unspecified: Secondary | ICD-10-CM | POA: Diagnosis present

## 2016-06-27 DIAGNOSIS — L0390 Cellulitis, unspecified: Secondary | ICD-10-CM

## 2016-06-27 DIAGNOSIS — E1022 Type 1 diabetes mellitus with diabetic chronic kidney disease: Secondary | ICD-10-CM | POA: Diagnosis present

## 2016-06-27 LAB — CBC WITH DIFFERENTIAL/PLATELET
BASOS PCT: 0 %
Basophils Absolute: 0 10*3/uL (ref 0.0–0.1)
EOS ABS: 0.1 10*3/uL (ref 0.0–0.7)
Eosinophils Relative: 2 %
HCT: 41.2 % (ref 36.0–46.0)
HEMOGLOBIN: 12.7 g/dL (ref 12.0–15.0)
LYMPHS PCT: 4 %
Lymphs Abs: 0.2 10*3/uL — ABNORMAL LOW (ref 0.7–4.0)
MCH: 27.5 pg (ref 26.0–34.0)
MCHC: 30.8 g/dL (ref 30.0–36.0)
MCV: 89.4 fL (ref 78.0–100.0)
Monocytes Absolute: 0.5 10*3/uL (ref 0.1–1.0)
Monocytes Relative: 9 %
NEUTROS ABS: 4.6 10*3/uL (ref 1.7–7.7)
Neutrophils Relative %: 85 %
Platelets: 191 10*3/uL (ref 150–400)
RBC: 4.61 MIL/uL (ref 3.87–5.11)
RDW: 14.5 % (ref 11.5–15.5)
WBC Morphology: INCREASED
WBC: 5.4 10*3/uL (ref 4.0–10.5)

## 2016-06-27 LAB — BASIC METABOLIC PANEL
ANION GAP: 7 (ref 5–15)
BUN: 30 mg/dL — ABNORMAL HIGH (ref 6–20)
CALCIUM: 9 mg/dL (ref 8.9–10.3)
CO2: 18 mmol/L — AB (ref 22–32)
CREATININE: 1.79 mg/dL — AB (ref 0.44–1.00)
Chloride: 113 mmol/L — ABNORMAL HIGH (ref 101–111)
GFR calc Af Amer: 37 mL/min — ABNORMAL LOW (ref >60)
GFR calc non Af Amer: 32 mL/min — ABNORMAL LOW (ref >60)
GLUCOSE: 221 mg/dL — AB (ref 65–99)
Potassium: 4.3 mmol/L (ref 3.5–5.1)
Sodium: 138 mmol/L (ref 135–145)

## 2016-06-27 LAB — GLUCOSE, CAPILLARY
GLUCOSE-CAPILLARY: 45 mg/dL — AB (ref 65–99)
GLUCOSE-CAPILLARY: 113 mg/dL — AB (ref 65–99)
Glucose-Capillary: 198 mg/dL — ABNORMAL HIGH (ref 65–99)
Glucose-Capillary: 173 mg/dL — ABNORMAL HIGH (ref 65–99)
Glucose-Capillary: 67 mg/dL (ref 65–99)
Glucose-Capillary: 81 mg/dL (ref 65–99)

## 2016-06-27 LAB — MRSA PCR SCREENING: MRSA BY PCR: NEGATIVE

## 2016-06-27 LAB — GLUCOSE, RANDOM: Glucose, Bld: 50 mg/dL — ABNORMAL LOW (ref 65–99)

## 2016-06-27 LAB — MAGNESIUM: MAGNESIUM: 1.3 mg/dL — AB (ref 1.7–2.4)

## 2016-06-27 MED ORDER — ZOLPIDEM TARTRATE 5 MG PO TABS
5.0000 mg | ORAL_TABLET | Freq: Once | ORAL | Status: AC
  Administered 2016-06-27: 5 mg via ORAL
  Filled 2016-06-27: qty 1

## 2016-06-27 MED ORDER — MAGNESIUM SULFATE 4 GM/100ML IV SOLN
4.0000 g | Freq: Once | INTRAVENOUS | Status: AC
  Administered 2016-06-27: 4 g via INTRAVENOUS
  Filled 2016-06-27: qty 100

## 2016-06-27 MED ORDER — CHOLESTYRAMINE 4 G PO PACK
4.0000 g | PACK | Freq: Once | ORAL | Status: AC
  Administered 2016-06-27: 4 g via ORAL
  Filled 2016-06-27: qty 1

## 2016-06-27 NOTE — Progress Notes (Signed)
Patient has had 2 episodes incontinence of loose stools in last hour. Requests Immodium. Will contact MD on call.

## 2016-06-27 NOTE — Progress Notes (Signed)
Patient had 2 episodes of emesis. Gave zofran with no relief. Gave compazine which decreased her nausea. Will continue to monitor.

## 2016-06-27 NOTE — Progress Notes (Signed)
Hypoglycemic Event  CBG:* **45  Treatment: 15 GM carbohydrate snack  Symptoms: Sweaty  Follow-up CBG: Time:1830 CBG Result:50  Possible Reasons for Event: Inadequate meal intake and Medication regimen: insulin  Comments/MD notified:dr [employer]thompson      Isais Klipfel, Illene SilverElsie Espinosa

## 2016-06-27 NOTE — H&P (Signed)
History and Physical    [name] QMV:[id number] DOB: Apr 28, 1966 DOA: 06/26/2016    PCP: Dois Davenport., MD  Patient coming from: home  Chief Complaint: Pain and swelling around ulcer  HPI: [name] is a 50 y.o. female with medical history significant of renal transplant (2011) HTN, DM poorly controlled, hypothryoidism who fell in September and sustained an injury to her right calf which developed into an ulcer. She states her doctor has been helping to take care of it but over the past couple of days she's noticed the surrounding skin around the ulcer has become more red and painful. She hasn't noticed any discharge. No complaint of fever or chills. Her sugars are also noted to be elevated in the 500s. She states that they typically run in the 400s despite taking her medications appropriately.  ED Course: Glucose 560, BUN 35, Cr 1.39, CO2 21, Hb 11.3  Review of Systems:  -Weight loss of about 20 pounds in the past few months Has on and off leg swelling, heartburn, frequent headaches -Easy bruising All other systems reviewed and apart from HPI, are negative.  Past Medical History:  Diagnosis Date  . Arthritis    "elbows, knees, legs, back" (09/24/2015)  . Daily headache   . Depression    "years ago"  . End stage renal disease (HCC)    right arm AV graft, post transplant  . Hypertension   . Hypothyroid   . 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 Kidney disease   . Pneumonia ~ 2007?  [name] Type II diabetes mellitus (HCC)    Insulin dependant    Past Surgical History:  Procedure Laterality Date  . AMPUTATION Left 05/11/2014   Procedure: AMPUTATION LEFT GREAT TOE;  Surgeon: Kathryne Hitch, MD;  Location: [facility];  Service: Orthopedics;  Laterality: Left;  . AV FISTULA PLACEMENT Right    forearm  . BACK SURGERY    . CATARACT EXTRACTION W/ INTRAOCULAR LENS  IMPLANT, BILATERAL Bilateral   . CESAREAN SECTION  07/1999  . DG AV DIALYSIS  GRAFT DECLOT OR    . INCISION AND DRAINAGE Right 06/02/2015   Procedure: INCISION AND DRAINAGE OF RIGHT 3rd RAY RESECTION;  Surgeon: Kathryne Hitch, MD;  Location: [facility];  Service: Orthopedics;  Laterality: Right;  . 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 LUMBAR DISC SURGERY  2001  . NEPHRECTOMY TRANSPLANTED ORGAN    . TOE AMPUTATION Right    1,2 & 3rd toes.  . TUBAL LIGATION  07/1999    Social History:   reports that she quit smoking about 17 years ago. Her smoking use included Cigarettes. She has a 11.00 pack-year smoking history. She has never used smokeless tobacco. She reports that she does not drink alcohol or use drugs.  Allergies  Allergen Reactions  . Ace Inhibitors Cough  . Adhesive [Tape] Itching and Other (See Comments)    Please use paper tape  . Lisinopril Cough    Family History  Problem Relation Age of Onset  . Emphysema Mother   . Throat cancer Mother   . COPD Mother   . Cancer Mother   . Emphysema Father   . COPD Father   . Stroke Father   . ADD / ADHD Son      Prior to Admission medications   Medication Sig Start Date End Date Taking? Authorizing Provider  acetaminophen (TYLENOL) 325 MG tablet Take 2 tablets (650 mg total) by mouth  every 6 (six) hours as needed for mild pain, moderate pain, fever or headache. 05/15/16  Yes Elease Etienne, MD  amLODipine (NORVASC) 5 MG tablet Take 5 mg by mouth daily.   Yes Historical Provider, MD  aspirin EC 81 MG tablet Take 81 mg by mouth daily.   Yes Historical Provider, MD  carvedilol (COREG) 25 MG tablet Take 2 tablets (50 mg total) by mouth 2 (two) times daily. 05/15/16  Yes Elease Etienne, MD  furosemide (LASIX) 40 MG tablet Take 40 mg by mouth daily.   Yes Historical Provider, MD  insulin aspart (NOVOLOG) 100 UNIT/ML injection Before each meal 3 times a day, 140-199 - 2 units, 200-250 - 4 units, 251-299 - 6 units,  300-349 - 8 units,  350 or above 10 units. Insulin PEN if approved, provide syringes and  needles if needed. 04/18/16  Yes Albertine Grates, MD  insulin NPH-regular Human (NOVOLIN 70/30) (70-30) 100 UNIT/ML injection Inject 22 Units into the skin 2 (two) times daily with a meal. Patient taking differently: Inject 30 Units into the skin 2 (two) times daily with a meal.  04/17/16  Yes Albertine Grates, MD  levothyroxine (SYNTHROID, LEVOTHROID) 137 MCG tablet Take 1 tablet (137 mcg total) by mouth daily. BRAND NAME ONLY 03/04/15  Yes Alison Murray, MD  mycophenolate (CELLCEPT) 250 MG capsule Take 500 mg by mouth 2 (two) times daily.    Yes Historical Provider, MD  predniSONE (DELTASONE) 5 MG tablet Take 5 mg by mouth daily. 05/29/15  Yes Historical Provider, MD  ranitidine (ZANTAC) 150 MG tablet Take 150 mg by mouth daily. 03/27/16  Yes Historical Provider, MD  sodium bicarbonate 650 MG tablet Take 650 mg by mouth daily.   Yes Historical Provider, MD  tacrolimus (PROGRAF) 0.5 MG capsule Take 0.5-1 mg by mouth 2 (two) times daily. Takes 1mg  in the morning and 0.5mg  at night   Yes Historical Provider, MD  metFORMIN (GLUCOPHAGE-XR) 500 MG 24 hr tablet Take 500 mg by mouth daily with breakfast.  01/16/16   Historical Provider, MD    Physical Exam: Vitals:   06/26/16 1456 06/26/16 1542 06/26/16 2223 06/27/16 0441  BP: (!) 127/53 (!) 158/69 (!) 142/79 104/60  Pulse: 75 81 76 80  Resp: 16 18  20   Temp:  97.5 F (36.4 C) 97.6 F (36.4 C) 97.5 F (36.4 C)  TempSrc:  Oral Oral Oral  SpO2: 95% 98% 98% 97%  Weight:  88.5 kg (195 lb)    Height:  5\' 7"  (1.702 m)        Constitutional: NAD, calm, comfortable Eyes: PERTLA, lids and conjunctivae normal ENMT: Mucous membranes are moist. Posterior pharynx clear of any exudate or lesions. Normal dentition.  Neck: normal, supple, no masses, no thyromegaly Respiratory: clear to auscultation bilaterally, no wheezing, no crackles. Normal respiratory effort. No accessory muscle use.  Cardiovascular: S1 & S2 heard, regular rate and rhythm, no murmurs / rubs / gallops. No  extremity edema. 2+ pedal pulses. No carotid bruits.  Abdomen: No distension, no tenderness, no masses palpated. No hepatosplenomegaly. Bowel sounds normal.  Musculoskeletal: no clubbing / cyanosis. No joint deformity upper and lower extremities. Good ROM, no contractures. Normal muscle tone.  Skin: no rashes, lesions, ulcers. No induration Neurologic: CN 2-12 grossly intact. Sensation intact, DTR normal. Strength 5/5 in all 4 limbs.  Psychiatric: Normal judgment and insight. Alert and oriented x 3. Normal mood.     Labs on Admission: I have personally reviewed following labs  and imaging studies  CBC:  Recent Labs Lab 06/26/16 1053  WBC 7.3  HGB 11.3*  HCT 35.9*  MCV 89.1  PLT 196   Basic Metabolic Panel:  Recent Labs Lab 06/26/16 1053  NA 132*  K 4.2  CL 102  CO2 21*  GLUCOSE 560*  BUN 35*  CREATININE 1.39*  CALCIUM 9.7   GFR: Estimated Creatinine Clearance: 56 mL/min (by C-G formula based on SCr of 1.39 mg/dL (H)). Liver Function Tests: No results for input(s): AST, ALT, ALKPHOS, BILITOT, PROT, ALBUMIN in the last 168 hours. No results for input(s): LIPASE, AMYLASE in the last 168 hours. No results for input(s): AMMONIA in the last 168 hours. Coagulation Profile: No results for input(s): INR, PROTIME in the last 168 hours. Cardiac Enzymes: No results for input(s): CKTOTAL, CKMB, CKMBINDEX, TROPONINI in the last 168 hours. BNP (last 3 results) No results for input(s): PROBNP in the last 8760 hours. HbA1C: No results for input(s): HGBA1C in the last 72 hours. CBG:  Recent Labs Lab 06/26/16 1113 06/26/16 1437 06/26/16 1615 06/26/16 2141 06/27/16 0751  GLUCAP 516* 458* 451* 343* 198*   Lipid Profile: No results for input(s): CHOL, HDL, LDLCALC, TRIG, CHOLHDL, LDLDIRECT in the last 72 hours. Thyroid Function Tests: No results for input(s): TSH, T4TOTAL, FREET4, T3FREE, THYROIDAB in the last 72 hours. Anemia Panel: No results for input(s): VITAMINB12,  FOLATE, FERRITIN, TIBC, IRON, RETICCTPCT in the last 72 hours. Urine analysis:    Component Value Date/Time   COLORURINE YELLOW 06/26/2016 1049   APPEARANCEUR CLEAR 06/26/2016 1049   LABSPEC 1.025 06/26/2016 1049   PHURINE 5.5 06/26/2016 1049   GLUCOSEU >1000 (A) 06/26/2016 1049   HGBUR TRACE (A) 06/26/2016 1049   BILIRUBINUR NEGATIVE 06/26/2016 1049   KETONESUR 15 (A) 06/26/2016 1049   PROTEINUR 100 (A) 06/26/2016 1049   UROBILINOGEN 0.2 03/02/2015 1153   NITRITE NEGATIVE 06/26/2016 1049   LEUKOCYTESUR NEGATIVE 06/26/2016 1049   Sepsis Labs: @LABRCNTIP (procalcitonin:4,lacticidven:4) ) Recent Results (from the past 240 hour(s))  MRSA PCR Screening     Status: None   Collection Time: 06/27/16  6:37 AM  Result Value Ref Range Status   MRSA by PCR NEGATIVE NEGATIVE Final    Comment:        The GeneXpert MRSA Assay (FDA approved for NASAL specimens only), is one component of a comprehensive MRSA colonization surveillance program. It is not intended to diagnose MRSA infection nor to guide or monitor treatment for MRSA infections.      Radiological Exams on Admission: No results found.    Assessment/Plan Principal Problem:   Cellulitis -She is immune compromised and an uncontrolled diabetic, have started vancomycin and Zosyn -We will obtain wound care eval -recommend that she has outpatient care and wound care center once she is discharged  Active Problems:   Insulin dependent diabetes mellitus -Uncontrolled/brittle diabetes per patient -On 70/30 at home which I will resume at a higher dose of 40 mg BID instead of 30- adjust dose as needed - will only use sliding scale at lunch    Hypothyroidism - synthroid    H/O kidney transplant - continue immunosuppressants     CKD (chronic kidney disease) stage 3, GFR 30-59 ml/min- mild metabolic acidosis - cont Bicarb tabls  HTN - Norvasc, Coreg, lasix  GERD - Zantac at home- Pepcid while here    DVT  prophylaxis: lovenox Code Status: Full code  Family Communication:   Disposition Plan: admit to med/surg  Consults called:   Admission status:  observation    [facility] MD [facility] Pager: [email or web address] Password [email or web address] 7PM-7AM, please contact night-coverage   06/27/2016, 8:38 AM

## 2016-06-27 NOTE — Progress Notes (Signed)
PROGRESS NOTE    [name] Parodyamela S Mcmichen  ZOX:[id number] DOB: March 08, 1966 DOA: 06/26/2016 PCP: Dois DavenportICHTER,KAREN L., MD    Brief Narrative:   [name] is a 50 y.o. female with medical history significant of renal transplant (2011) HTN, DM poorly controlled, hypothryoidism who fell in September and sustained an injury to her right calf which developed into an ulcer. She states her doctor has been helping to take care of it but over the past couple of days she's noticed the surrounding skin around the ulcer has become more red and painful. She hasn't noticed any discharge. No complaint of fever or chills. Her sugars are also noted to be elevated in the 500s. She states that they typically run in the 400s despite taking her medications appropriately.  ED Course: Glucose 560, BUN 35, Cr 1.39, CO2 21, Hb 11.3   Assessment & Plan:   Principal Problem:   Cellulitis Active Problems:   Hypothyroidism   H/O kidney transplant   Uncontrolled type 1 diabetes mellitus with foot ulcer (HCC)   Hyperglycemia without ketosis   Insulin dependent diabetes mellitus (HCC)   CKD (chronic kidney disease) stage 3, GFR 30-59 ml/min   Cellulitis of right lower extremity   Hyperglycemia   #1 right lower extremity wound with cellulitis Patient noted to be immunocompromised with poorly controlled diabetes. Will get wound cultures. Check a sedimentation rate. Check a C-reactive protein. Check plain films of the right tib-fib to rule out osteomyelitis. Continue empiric IV vancomycin and IV Zosyn and follow.  #2 poorly controlled diabetes mellitus Hemoglobin A1c 10.3 05/13/2016. CBGs have improved since admission and ranging from 173-198. On admission CBGs were in the 400s to 500s. Continue current regimen of 70/30 at higher dose of 40 units twice a day. Sliding scale insulin.  #3 hypothyroidism Continue home dose Synthroid.  #4 history of kidney transplant Continue CellCept, prednisone, prograf  #5 metabolic  acidosis Secondary to chronic kidney disease. Continue bicarbonate tablets.  #6 chronic kidney disease stage III Stable. Continue bicarbonate tablets. Follow.  #7 hypertension Stable. Continue current regimen of Norvasc, Coreg, Lasix.  #8 gastroesophageal reflux disease Pepcid.   DVT prophylaxis: Lovenox Code Status: Full Family Communication: Updated patient. No family at bedside. Disposition Plan: Home when medically stable and improvement with cellulitis.   Consultants:   Wound care pending  Procedures:   None  Antimicrobials:   IV Zosyn 06/26/2016  IV vancomycin 06/26/2016   Subjective: Patient denies any chest pain. No shortness of breath. Sleeping. Easily arousable.  Objective: Vitals:   06/26/16 1456 06/26/16 1542 06/26/16 2223 06/27/16 0441  BP: (!) 127/53 (!) 158/69 (!) 142/79 104/60  Pulse: 75 81 76 80  Resp: 16 18  20   Temp:  97.5 F (36.4 C) 97.6 F (36.4 C) 97.5 F (36.4 C)  TempSrc:  Oral Oral Oral  SpO2: 95% 98% 98% 97%  Weight:  88.5 kg (195 lb)    Height:  5\' 7"  (1.702 m)      Intake/Output Summary (Last 24 hours) at 06/27/16 1230 Last data filed at 06/27/16 1022  Gross per 24 hour  Intake          1688.75 ml  Output                0 ml  Net          1688.75 ml   Filed Weights   06/26/16 1036 06/26/16 1542  Weight: 88 kg (194 lb) 88.5 kg (195 lb)    Examination:  General exam: Appears calm and comfortable  Respiratory system: Clear to auscultation. Respiratory effort normal. Cardiovascular system: S1 & S2 heard, RRR. No JVD, murmurs, rubs, gallops or clicks. No pedal edema. Gastrointestinal system: Abdomen is nondistended, soft and nontender. No organomegaly or masses felt. Normal bowel sounds heard. Central nervous system: Alert and oriented. No focal neurological deficits. Extremities: Symmetric 5 x 5 power. Skin: Right lower extremity with Mepilex and also noted with yellowish exudate noted, with minimal surrounding  erythema. No rashes, lesions or ulcers Psychiatry: Judgement and insight appear normal. Mood & affect appropriate.     Data Reviewed: I have personally reviewed following labs and imaging studies  CBC:  Recent Labs Lab 06/26/16 1053 06/27/16 0915  WBC 7.3 5.4  NEUTROABS  --  4.6  HGB 11.3* 12.7  HCT 35.9* 41.2  MCV 89.1 89.4  PLT 196 191   Basic Metabolic Panel:  Recent Labs Lab 06/26/16 1053 06/27/16 0915  NA 132* 138  K 4.2 4.3  CL 102 113*  CO2 21* 18*  GLUCOSE 560* 221*  BUN 35* 30*  CREATININE 1.39* 1.79*  CALCIUM 9.7 9.0  MG  --  1.3*   GFR: Estimated Creatinine Clearance: 43.5 mL/min (by C-G formula based on SCr of 1.79 mg/dL (H)). Liver Function Tests: No results for input(s): AST, ALT, ALKPHOS, BILITOT, PROT, ALBUMIN in the last 168 hours. No results for input(s): LIPASE, AMYLASE in the last 168 hours. No results for input(s): AMMONIA in the last 168 hours. Coagulation Profile: No results for input(s): INR, PROTIME in the last 168 hours. Cardiac Enzymes: No results for input(s): CKTOTAL, CKMB, CKMBINDEX, TROPONINI in the last 168 hours. BNP (last 3 results) No results for input(s): PROBNP in the last 8760 hours. HbA1C: No results for input(s): HGBA1C in the last 72 hours. CBG:  Recent Labs Lab 06/26/16 1437 06/26/16 1615 06/26/16 2141 06/27/16 0751 06/27/16 1126  GLUCAP 458* 451* 343* 198* 173*   Lipid Profile: No results for input(s): CHOL, HDL, LDLCALC, TRIG, CHOLHDL, LDLDIRECT in the last 72 hours. Thyroid Function Tests: No results for input(s): TSH, T4TOTAL, FREET4, T3FREE, THYROIDAB in the last 72 hours. Anemia Panel: No results for input(s): VITAMINB12, FOLATE, FERRITIN, TIBC, IRON, RETICCTPCT in the last 72 hours. Sepsis Labs:  Recent Labs Lab 06/26/16 1119  LATICACIDVEN 1.28    Recent Results (from the past 240 hour(s))  Culture, blood (routine x 2)     Status: None (Preliminary result)   Collection Time: 06/26/16  1:24 PM   Result Value Ref Range Status   Specimen Description BLOOD RIGHT HAND  Final   Special Requests IN PEDIATRIC BOTTLE 3CC  Final   Culture   Final    NO GROWTH < 24 HOURS Performed at [facility]    Report Status PENDING  Incomplete  MRSA PCR Screening     Status: None   Collection Time: 06/27/16  6:37 AM  Result Value Ref Range Status   MRSA by PCR NEGATIVE NEGATIVE Final    Comment:        The GeneXpert MRSA Assay (FDA approved for NASAL specimens only), is one component of a comprehensive MRSA colonization surveillance program. It is not intended to diagnose MRSA infection nor to guide or monitor treatment for MRSA infections.          Radiology Studies: No results found.      Scheduled Meds: . amLODipine  5 mg Oral Daily  . aspirin EC  81 mg Oral Daily  .  carvedilol  50 mg Oral BID  . enoxaparin (LOVENOX) injection  40 mg Subcutaneous Q24H  . famotidine  20 mg Oral BID  . furosemide  40 mg Oral Daily  . insulin aspart  0-15 Units Subcutaneous QAC lunch  . insulin aspart  0-15 Units Subcutaneous TID WC  . insulin aspart  0-5 Units Subcutaneous QHS  . insulin aspart protamine- aspart  40 Units Subcutaneous BID WC  . magnesium sulfate 1 - 4 g bolus IVPB  4 g Intravenous Once  . mycophenolate  500 mg Oral BID  . piperacillin-tazobactam (ZOSYN)  IV  3.375 g Intravenous Q8H  . predniSONE  5 mg Oral Q breakfast  . sodium bicarbonate  650 mg Oral Daily  . sodium chloride  1,000 mL Intravenous Once  . levothyroxine  137 mcg Oral QAC breakfast  . tacrolimus  0.5 mg Oral QHS  . tacrolimus  1 mg Oral Daily  . vancomycin  1,500 mg Intravenous Q24H   Continuous Infusions: . sodium chloride 100 mL/hr at 06/27/16 0937     LOS: 0 days    Time spent: 35 mins    THOMPSON,DANIEL, MD [facility] Pager [phone]  If 7PM-7AM, please contact night-coverage [email or web address] Password [email or web address] 06/27/2016, 12:30 PM

## 2016-06-27 NOTE — ED Provider Notes (Signed)
Medical screening examination/treatment/procedure(s) were conducted as a shared visit with non-physician practitioner(s) and myself.  I personally evaluated the patient during the encounter.   EKG Interpretation None      50 year old female who presents for wound check and hyperglycemia. She is brittle Type 1 diabetic with ESRD s/p renal transplant on immunosuppressive agents. Has had ulcer to the right lower leg, of unclear duration but over one week with increased pain, drainage, and redness surrounding. No fever, chills, N/V. With elevated blood glucose at home for one day and by EMS when they were call today.   Afebrile in ED. Stable vital signs. No systemic signs or symptoms of sepsis. No significant leukocytosis or elevated lactic. With glucose of 560, but normal bicarbonate and no anion gap. No concerns for DKA or hyperosmolar non-ketotic syndrome. Right lower leg with large ulcer with surrounding erythema and induration. Exquisitely tender to palpation. Concern for cellulitis. Immune suppressed as well and higher risk. Will give IVF, antibiotics. Admit to hospitalist service for hyperglycemia and cellulitis.    Lavera Guiseana Duo Roberth Berling, MD 06/27/16 [phone]

## 2016-06-27 NOTE — Progress Notes (Signed)
BP rechecked manually 94/60. MD on call notified. Will continue to monitor.

## 2016-06-28 DIAGNOSIS — N179 Acute kidney failure, unspecified: Secondary | ICD-10-CM

## 2016-06-28 LAB — URINALYSIS, ROUTINE W REFLEX MICROSCOPIC
Glucose, UA: NEGATIVE mg/dL
HGB URINE DIPSTICK: NEGATIVE
KETONES UR: NEGATIVE mg/dL
NITRITE: NEGATIVE
PH: 5.5 (ref 5.0–8.0)
Protein, ur: 30 mg/dL — AB
SPECIFIC GRAVITY, URINE: 1.026 (ref 1.005–1.030)

## 2016-06-28 LAB — BASIC METABOLIC PANEL
Anion gap: 11 (ref 5–15)
BUN: 32 mg/dL — AB (ref 6–20)
CHLORIDE: 111 mmol/L (ref 101–111)
CO2: 15 mmol/L — ABNORMAL LOW (ref 22–32)
Calcium: 9 mg/dL (ref 8.9–10.3)
Creatinine, Ser: 2.29 mg/dL — ABNORMAL HIGH (ref 0.44–1.00)
GFR calc Af Amer: 28 mL/min — ABNORMAL LOW (ref >60)
GFR calc non Af Amer: 24 mL/min — ABNORMAL LOW (ref >60)
GLUCOSE: 200 mg/dL — AB (ref 65–99)
POTASSIUM: 4.1 mmol/L (ref 3.5–5.1)
SODIUM: 137 mmol/L (ref 135–145)

## 2016-06-28 LAB — CBC WITH DIFFERENTIAL/PLATELET
Basophils Absolute: 0 10*3/uL (ref 0.0–0.1)
Basophils Relative: 0 %
EOS PCT: 1 %
Eosinophils Absolute: 0.1 10*3/uL (ref 0.0–0.7)
HEMATOCRIT: 37.4 % (ref 36.0–46.0)
Hemoglobin: 11.7 g/dL — ABNORMAL LOW (ref 12.0–15.0)
LYMPHS ABS: 0.4 10*3/uL — AB (ref 0.7–4.0)
LYMPHS PCT: 7 %
MCH: 28 pg (ref 26.0–34.0)
MCHC: 31.3 g/dL (ref 30.0–36.0)
MCV: 89.5 fL (ref 78.0–100.0)
MONOS PCT: 6 %
Monocytes Absolute: 0.3 10*3/uL (ref 0.1–1.0)
Neutro Abs: 4.3 10*3/uL (ref 1.7–7.7)
Neutrophils Relative %: 86 %
PLATELETS: 164 10*3/uL (ref 150–400)
RBC: 4.18 MIL/uL (ref 3.87–5.11)
RDW: 14.8 % (ref 11.5–15.5)
WBC: 5.1 10*3/uL (ref 4.0–10.5)

## 2016-06-28 LAB — C DIFFICILE QUICK SCREEN W PCR REFLEX
C DIFFICILE (CDIFF) INTERP: NOT DETECTED
C DIFFICILE (CDIFF) TOXIN: NEGATIVE
C Diff antigen: NEGATIVE

## 2016-06-28 LAB — GLUCOSE, CAPILLARY
GLUCOSE-CAPILLARY: 176 mg/dL — AB (ref 65–99)
GLUCOSE-CAPILLARY: 154 mg/dL — AB (ref 65–99)
GLUCOSE-CAPILLARY: 27 mg/dL — AB (ref 65–99)
GLUCOSE-CAPILLARY: 23 mg/dL — AB (ref 65–99)
Glucose-Capillary: 89 mg/dL (ref 65–99)
Glucose-Capillary: 93 mg/dL (ref 65–99)

## 2016-06-28 LAB — SEDIMENTATION RATE: Sed Rate: 22 mm/hr (ref 0–22)

## 2016-06-28 LAB — URINE MICROSCOPIC-ADD ON

## 2016-06-28 LAB — C-REACTIVE PROTEIN: CRP: 7.5 mg/dL — ABNORMAL HIGH (ref <1.0)

## 2016-06-28 LAB — VANCOMYCIN, TROUGH: VANCOMYCIN TR: 60 ug/mL — AB (ref 15–20)

## 2016-06-28 MED ORDER — DEXTROSE 50 % IV SOLN
INTRAVENOUS | Status: AC
  Administered 2016-06-28: 25 mL
  Filled 2016-06-28: qty 50

## 2016-06-28 MED ORDER — STERILE WATER FOR INJECTION IV SOLN
INTRAVENOUS | Status: DC
  Administered 2016-06-28 – 2016-06-29 (×2): via INTRAVENOUS
  Filled 2016-06-28 (×5): qty 850

## 2016-06-28 MED ORDER — DOXYCYCLINE HYCLATE 100 MG PO TABS
100.0000 mg | ORAL_TABLET | Freq: Two times a day (BID) | ORAL | Status: DC
  Administered 2016-06-28 – 2016-06-29 (×3): 100 mg via ORAL
  Filled 2016-06-28 (×3): qty 1

## 2016-06-28 MED ORDER — CHOLESTYRAMINE 4 G PO PACK
4.0000 g | PACK | Freq: Two times a day (BID) | ORAL | Status: DC
  Administered 2016-06-28 – 2016-06-30 (×5): 4 g via ORAL
  Filled 2016-06-28 (×6): qty 1

## 2016-06-28 MED ORDER — CARVEDILOL 12.5 MG PO TABS
12.5000 mg | ORAL_TABLET | Freq: Two times a day (BID) | ORAL | Status: DC
Start: 2016-06-28 — End: 2016-06-29
  Administered 2016-06-28: 12.5 mg via ORAL
  Filled 2016-06-28 (×2): qty 1

## 2016-06-28 MED ORDER — CARVEDILOL 25 MG PO TABS: 25.0000 mg | ORAL_TABLET | Freq: Two times a day (BID) | ORAL | Status: DC

## 2016-06-28 MED ORDER — DEXTROSE 5 % IV SOLN
2.0000 g | INTRAVENOUS | Status: DC
  Administered 2016-06-28 – 2016-06-29 (×2): 2 g via INTRAVENOUS
  Filled 2016-06-28 (×3): qty 2

## 2016-06-28 NOTE — Progress Notes (Signed)
PROGRESS NOTE    [name]  AVW:[id number] DOB: 09-Jul-1966 DOA: 06/26/2016 PCP: Dois DavenportICHTER,KAREN L., MD    Brief Narrative:   [name] is a 50 y.o. female with medical history significant of renal transplant (2011) HTN, DM poorly controlled, hypothryoidism who fell in September and sustained an injury to her right calf which developed into an ulcer. She states her doctor has been helping to take care of it but over the past couple of days she's noticed the surrounding skin around the ulcer has become more red and painful. She hasn't noticed any discharge. No complaint of fever or chills. Her sugars are also noted to be elevated in the 500s. She states that they typically run in the 400s despite taking her medications appropriately.  ED Course: Glucose 560, BUN 35, Cr 1.39, CO2 21, Hb 11.3   Assessment & Plan:   Principal Problem:   Cellulitis Active Problems:   Acute renal failure superimposed on stage 3 chronic kidney disease (HCC)   Hypothyroidism   H/O kidney transplant   Uncontrolled type 1 diabetes mellitus with foot ulcer (HCC)   Hyperglycemia without ketosis   Insulin dependent diabetes mellitus (HCC)   CKD (chronic kidney disease) stage 3, GFR 30-59 ml/min   Cellulitis of right lower extremity   Hyperglycemia   Wound cellulitis   #1 right lower extremity wound with cellulitis Patient noted to be immunocompromised with poorly controlled diabetes. Wound cultures pending. Sedimentation rate within normal limits. CRP elevated. Plain films of the right tib-fib negative for osteomyelitis. Discontinue IV vancomycin and IV Zosyn. Place on IV Rocephin and oral doxycycline.  #2 poorly controlled diabetes mellitus Hemoglobin A1c 10.3 05/13/2016. CBGs have improved since admission and ranging from 113-176. On admission CBGs were in the 400s to 500s. Continue current regimen of 70/30 at higher dose of 40 units twice a day. Sliding scale insulin.  #3  hypothyroidism Continue home dose Synthroid.  #4 history of kidney transplant Continue CellCept, prednisone, prograf.  #5 metabolic acidosis Secondary to acute on chronic kidney disease. Discontinue bicarbonate tablets. Placed on a bicarbonate drip.  #6 acute on chronic kidney disease stage III Patient noted to have worsening renal function creatinine currently at 2.29 from 1.79 yesterday from 1.39 on admission. Differential includes prerenal versus renal. Patient states good urine output. Overnight trough was elevated at 60 however misleading as he was taking 3-4 hours after vancomycin was given. Will repeat overnight trough tomorrow morning. Discontinue IV vancomycin and IV Zosyn. Check a urine sodium. Check a urine creatinine. Repeat UAs with cultures and sensitivities. Place on a bicarbonate drip. Continue CellCept, prednisone, Prograf. Discontinue bicarbonate tablets. Nephrology consultation. Follow.  #7 hypertension Stable. Continue current regimen of Norvasc, Coreg. Hold Lasix due to worsening renal function.  #8 gastroesophageal reflux disease Pepcid.   DVT prophylaxis: Lovenox Code Status: Full Family Communication: Updated patient. No family at bedside. Disposition Plan: Home when medically stable and improvement with cellulitis.   Consultants:   Wound care pending  Procedures:   None  Antimicrobials:   IV Zosyn 06/26/2016>>>>>>06/28/2016  IV vancomycin 06/26/2016>>>>>06/28/2016  IV Rocephin 06/28/2016  Oral doxycycline 06/28/2016   Subjective: Patient denies any chest pain. No shortness of breath. Sleeping. Easily arousable.  Objective: Vitals:   06/27/16 2047 06/27/16 2055 06/28/16 0650 06/28/16 1206  BP: (!) 71/46 94/60 (!) 117/58 124/72  Pulse: 78  82 73  Resp: 16  18 18   Temp: 97.8 F (36.6 C)  98.6 F (37 C) 98 F (36.7 C)  TempSrc: Oral  Oral Oral  SpO2: 100%  97% 100%  Weight:      Height:        Intake/Output Summary (Last 24 hours) at  06/28/16 1521 Last data filed at 06/28/16 1324  Gross per 24 hour  Intake             2115 ml  Output               11 ml  Net             2104 ml   Filed Weights   06/26/16 1036 06/26/16 1542  Weight: 88 kg (194 lb) 88.5 kg (195 lb)    Examination:  General exam: Appears calm and comfortable  Respiratory system: Clear to auscultation. Respiratory effort normal. Cardiovascular system: S1 & S2 heard, RRR. No JVD, murmurs, rubs, gallops or clicks. No pedal edema. Gastrointestinal system: Abdomen is nondistended, soft and nontender. No organomegaly or masses felt. Normal bowel sounds heard. Central nervous system: Alert and oriented. No focal neurological deficits. Extremities: Symmetric 5 x 5 power. Skin: Right lower extremity with ulcer/ wound noted with minimal surrounding erythema. No rashes, lesions or ulcers Psychiatry: Judgement and insight appear normal. Mood & affect appropriate.     Data Reviewed: I have personally reviewed following labs and imaging studies  CBC:  Recent Labs Lab 06/26/16 1053 06/27/16 0915 06/28/16 0612  WBC 7.3 5.4 5.1  NEUTROABS  --  4.6 4.3  HGB 11.3* 12.7 11.7*  HCT 35.9* 41.2 37.4  MCV 89.1 89.4 89.5  PLT 196 191 164   Basic Metabolic Panel:  Recent Labs Lab 06/26/16 1053 06/27/16 0915 06/27/16 1753 06/28/16 0612  NA 132* 138  --  137  K 4.2 4.3  --  4.1  CL 102 113*  --  111  CO2 21* 18*  --  15*  GLUCOSE 560* 221* 50* 200*  BUN 35* 30*  --  32*  CREATININE 1.39* 1.79*  --  2.29*  CALCIUM 9.7 9.0  --  9.0  MG  --  1.3*  --   --    GFR: Estimated Creatinine Clearance: 34 mL/min (by C-G formula based on SCr of 2.29 mg/dL (H)). Liver Function Tests: No results for input(s): AST, ALT, ALKPHOS, BILITOT, PROT, ALBUMIN in the last 168 hours. No results for input(s): LIPASE, AMYLASE in the last 168 hours. No results for input(s): AMMONIA in the last 168 hours. Coagulation Profile: No results for input(s): INR, PROTIME in the  last 168 hours. Cardiac Enzymes: No results for input(s): CKTOTAL, CKMB, CKMBINDEX, TROPONINI in the last 168 hours. BNP (last 3 results) No results for input(s): PROBNP in the last 8760 hours. HbA1C: No results for input(s): HGBA1C in the last 72 hours. CBG:  Recent Labs Lab 06/27/16 1901 06/27/16 1942 06/27/16 2053 06/28/16 0802 06/28/16 1201  GLUCAP 67 81 113* 176* 154*   Lipid Profile: No results for input(s): CHOL, HDL, LDLCALC, TRIG, CHOLHDL, LDLDIRECT in the last 72 hours. Thyroid Function Tests: No results for input(s): TSH, T4TOTAL, FREET4, T3FREE, THYROIDAB in the last 72 hours. Anemia Panel: No results for input(s): VITAMINB12, FOLATE, FERRITIN, TIBC, IRON, RETICCTPCT in the last 72 hours. Sepsis Labs:  Recent Labs Lab 06/26/16 1119  LATICACIDVEN 1.28    Recent Results (from the past 240 hour(s))  Culture, blood (routine x 2)     Status: None (Preliminary result)   Collection Time: 06/26/16  1:24 PM  Result Value Ref Range Status  Specimen Description BLOOD RIGHT HAND  Final   Special Requests IN PEDIATRIC BOTTLE 3CC  Final   Culture   Final    NO GROWTH 2 DAYS Performed at [facility]    Report Status PENDING  Incomplete  MRSA PCR Screening     Status: None   Collection Time: 06/27/16  6:37 AM  Result Value Ref Range Status   MRSA by PCR NEGATIVE NEGATIVE Final    Comment:        The GeneXpert MRSA Assay (FDA approved for NASAL specimens only), is one component of a comprehensive MRSA colonization surveillance program. It is not intended to diagnose MRSA infection nor to guide or monitor treatment for MRSA infections.   Aerobic Culture (superficial specimen)     Status: None (Preliminary result)   Collection Time: 06/27/16  1:59 PM  Result Value Ref Range Status   Specimen Description LEG RIGHT  Final   Special Requests NONE  Final   Gram Stain   Final    MODERATE WBC PRESENT, PREDOMINANTLY PMN ABUNDANT GRAM POSITIVE COCCI IN  PAIRS IN CLUSTERS Performed at [facility]    Culture ABUNDANT STAPHYLOCOCCUS AUREUS  Final   Report Status PENDING  Incomplete  C difficile quick scan w PCR reflex     Status: None   Collection Time: 06/28/16  6:00 AM  Result Value Ref Range Status   C Diff antigen NEGATIVE NEGATIVE Final   C Diff toxin NEGATIVE NEGATIVE Final   C Diff interpretation No C. difficile detected.  Final         Radiology Studies: Dg Tibia/fibula Right  Result Date: 06/27/2016 CLINICAL DATA:  Wound cellulitis.  Rule out osteomyelitis. EXAM: RIGHT TIBIA AND FIBULA - 2 VIEW COMPARISON:  05/27/2011 FINDINGS: Again noted are skin densities or calcifications along the lateral aspect of the lower leg. There is no evidence for a fracture or dislocation. Diffuse vascular calcifications. No evidence for periosteal reaction or cortical destruction. IMPRESSION: No acute abnormality. Electronically Signed   By: Richarda Overlie M.D.   On: 06/27/2016 13:42        Scheduled Meds: . amLODipine  5 mg Oral Daily  . aspirin EC  81 mg Oral Daily  . carvedilol  50 mg Oral BID  . cefTRIAXone (ROCEPHIN)  IV  2 g Intravenous Q24H  . cholestyramine  4 g Oral BID  . doxycycline  100 mg Oral Q12H  . enoxaparin (LOVENOX) injection  40 mg Subcutaneous Q24H  . famotidine  20 mg Oral BID  . furosemide  40 mg Oral Daily  . insulin aspart  0-15 Units Subcutaneous TID WC  . insulin aspart  0-5 Units Subcutaneous QHS  . insulin aspart protamine- aspart  40 Units Subcutaneous BID WC  . mycophenolate  500 mg Oral BID  . predniSONE  5 mg Oral Q breakfast  . levothyroxine  137 mcg Oral QAC breakfast  . tacrolimus  0.5 mg Oral QHS  . tacrolimus  1 mg Oral Daily   Continuous Infusions: .  sodium bicarbonate 150 mEq in sterile water 1000 mL infusion 100 mL/hr at 06/28/16 0923     LOS: 1 day    Time spent: 35 mins    Eual Lindstrom, MD [facility] Pager [phone]  If 7PM-7AM, please contact  night-coverage [email or web address] Password [email or web address] 06/28/2016, 3:21 PM

## 2016-06-28 NOTE — Consult Note (Signed)
Renal Service Consult Note [facility]  [name] 06/28/2016 [name] Requesting Physician:  Dr. Janee Morn  Reason for Consult:  Renal transplant patient w acute on CRF HPI: The patient is a 50 y.o. year-old with history of DM2, PNA, renal transplant, depression, DJD who presented to ED on 10/20 for BS's reading "HHH".  In ED was found to have a R calf ulcer which developed after bumping her leg, then noticed that the surrounding skin was turning red and painful.  She was admitted for cellulitis of the leg and started on zosyn and Vanc IV.  She rec'd 3 doses of Vanc.  Creat on admission was 1.39, then up to 1.79 yest and 2.29 today.  Asked to see for AKI on CKD of renal Tx.    Home meds > prograf, synthroid, norvasc, asa, coreg, NPH, Cellcept, prednisone, zantac  Inpatient meds > norvasc/ asa/ coreg/ insulin/ cellcept/ prograf/ prednisone, also IV vanc, zosyn, dilaudid, and rocephin, and po questran, doxycyline, pepcid, lasix 40/ d po, norco, zofran, and compazine IV.  No IV contrast, nsaids' or ACEi/ ARB.   Patient grew up in [place], went to [employer], worked at [employer] then at [employer] in [place].  Now lives with her husband and 63 yr old son.  No tobacco/ etoh.  Husband works as a Financial risk analyst at [employer].    Patient says she has had leg ulcers like this before and they did "I&D" on them and they eventually healed up.  The ulcer is less painful today then it was on admissoin yest am.    She started dialysis around 89 and got her cadaveric renal Tx at [facility] in 2011, f/b Dr Geanie Berlin.  DM 25 yrs.    ROS  denies CP  no joint pain   no HA  no blurry vision  no rash  no diarrhea  no nausea/ vomiting  no dysuria  no difficulty voiding  no change in urine color    Past Medical History  Past Medical History:  Diagnosis Date  . Arthritis    "elbows, knees, legs, back" (09/24/2015)  . Daily headache   . Depression    "years  ago"  . End stage renal disease (HCC)    right arm AV graft, post transplant  . Hypertension   . Hypothyroid   . 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 Kidney disease   . Pneumonia ~ 2007?  [name] Type II diabetes mellitus (HCC)    Insulin dependant   Past Surgical History  Past Surgical History:  Procedure Laterality Date  . AMPUTATION Left 05/11/2014   Procedure: AMPUTATION LEFT GREAT TOE;  Surgeon: Kathryne Hitch, MD;  Location: [facility];  Service: Orthopedics;  Laterality: Left;  . AV FISTULA PLACEMENT Right    forearm  . BACK SURGERY    . CATARACT EXTRACTION W/ INTRAOCULAR LENS  IMPLANT, BILATERAL Bilateral   . CESAREAN SECTION  07/1999  . DG AV DIALYSIS GRAFT DECLOT OR    . INCISION AND DRAINAGE Right 06/02/2015   Procedure: INCISION AND DRAINAGE OF RIGHT 3rd RAY RESECTION;  Surgeon: Kathryne Hitch, MD;  Location: [facility] OR;  Service: Orthopedics;  Laterality: Right;  . 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 LUMBAR DISC SURGERY  2001  . NEPHRECTOMY TRANSPLANTED ORGAN    . TOE AMPUTATION Right    1,2 & 3rd toes.  . TUBAL LIGATION  07/1999   Family History  Family  History  Problem Relation Age of Onset  . Emphysema Mother   . Throat cancer Mother   . COPD Mother   . Cancer Mother   . Emphysema Father   . COPD Father   . Stroke Father   . ADD / ADHD Son    Social History  reports that she quit smoking about 17 years ago. Her smoking use included Cigarettes. She has a 11.00 pack-year smoking history. She has never used smokeless tobacco. She reports that she does not drink alcohol or use drugs. Allergies  Allergies  Allergen Reactions  . Ace Inhibitors Cough  . Adhesive [Tape] Itching and Other (See Comments)    Please use paper tape  . Lisinopril Cough   Home medications Prior to Admission medications   Medication Sig Start Date End Date Taking? Authorizing Provider  acetaminophen (TYLENOL) 325 MG tablet Take 2 tablets (650 mg  total) by mouth every 6 (six) hours as needed for mild pain, moderate pain, fever or headache. 05/15/16  Yes Elease Etienne, MD  amLODipine (NORVASC) 5 MG tablet Take 5 mg by mouth daily.   Yes Historical Provider, MD  aspirin EC 81 MG tablet Take 81 mg by mouth daily.   Yes Historical Provider, MD  carvedilol (COREG) 25 MG tablet Take 2 tablets (50 mg total) by mouth 2 (two) times daily. 05/15/16  Yes Elease Etienne, MD  insulin aspart (NOVOLOG) 100 UNIT/ML injection Before each meal 3 times a day, 140-199 - 2 units, 200-250 - 4 units, 251-299 - 6 units,  300-349 - 8 units,  350 or above 10 units. Insulin PEN if approved, provide syringes and needles if needed. 04/18/16  Yes Albertine Grates, MD  insulin NPH-regular Human (NOVOLIN 70/30) (70-30) 100 UNIT/ML injection Inject 22 Units into the skin 2 (two) times daily with a meal. Patient taking differently: Inject 30 Units into the skin 2 (two) times daily with a meal.  04/17/16  Yes Albertine Grates, MD  levothyroxine (SYNTHROID, LEVOTHROID) 137 MCG tablet Take 1 tablet (137 mcg total) by mouth daily. BRAND NAME ONLY 03/04/15  Yes Alison Murray, MD  mycophenolate (CELLCEPT) 250 MG capsule Take 500 mg by mouth 2 (two) times daily.    Yes Historical Provider, MD  predniSONE (DELTASONE) 5 MG tablet Take 5 mg by mouth daily. 05/29/15  Yes Historical Provider, MD  ranitidine (ZANTAC) 150 MG tablet Take 150 mg by mouth daily. 03/27/16  Yes Historical Provider, MD  tacrolimus (PROGRAF) 0.5 MG capsule Take 0.5 mg by mouth every morning.    Yes Historical Provider, MD  tacrolimus (PROGRAF) 1 MG capsule Take 1 mg by mouth at bedtime.   Yes Historical Provider, MD  levothyroxine (SYNTHROID, LEVOTHROID) 137 MCG tablet Take by mouth. 06/17/16 07/17/16  Historical Provider, MD  SSD 1 % cream Apply 1 application topically daily.  06/10/16   Historical Provider, MD  tacrolimus (PROGRAF) 0.5 MG capsule TAKE 2 CAPSULES BY MOUTH EVERY MORNING AND TAKE 1 CAPSULE BY MOUTH EACH EVENING 06/17/16    Historical Provider, MD   Liver Function Tests No results for input(s): AST, ALT, ALKPHOS, BILITOT, PROT, ALBUMIN in the last 168 hours. No results for input(s): LIPASE, AMYLASE in the last 168 hours. CBC  Recent Labs Lab 06/26/16 1053 06/27/16 0915 06/28/16 0612  WBC 7.3 5.4 5.1  NEUTROABS  --  4.6 4.3  HGB 11.3* 12.7 11.7*  HCT 35.9* 41.2 37.4  MCV 89.1 89.4 89.5  PLT 196 191 164   Basic  Metabolic Panel  Recent Labs Lab 06/26/16 1053 06/27/16 0915 06/27/16 1753 06/28/16 0612  NA 132* 138  --  137  K 4.2 4.3  --  4.1  CL 102 113*  --  111  CO2 21* 18*  --  15*  GLUCOSE 560* 221* 50* 200*  BUN 35* 30*  --  32*  CREATININE 1.39* 1.79*  --  2.29*  CALCIUM 9.7 9.0  --  9.0   Iron/TIBC/Ferritin/ %Sat No results found for: IRON, TIBC, FERRITIN, IRONPCTSAT  Vitals:   06/27/16 2055 06/28/16 0650 06/28/16 1206 06/28/16 1821  BP: 94/60 (!) 117/58 124/72   Pulse:  82 73   Resp:  18 18   Temp:  98.6 F (37 C) 98 F (36.7 C)   TempSrc:  Oral Oral   SpO2:  97% 100%   Weight:    89.1 kg (196 lb 6.9 oz)  Height:       Exam Gen alert, pleasant, chron ill No rash, cyanosis or gangrene Sclera anicteric, throat clear  No jvd or bruits Chest clear bilat RRR soft SEM no RG Abd soft ntnd no mass or ascites +bs GU defer MS no joint effusions or deformity Ext trace LE edema, R lateral lower leg w 1.5 x 2 inch open leg ulcer, no odor or erythema  Neuro is alert, Ox 3 , nf  Na 137 K 4.1 CO2 15  BUN 32  Cr 2.29  Glu 200 WBC 5k  Hb 11.7  plt 164 UA > clear rare bact, 0-5 rbc/ wbc/ epi, 5.5, 1.025  Assessment: 1. Acute on CRF/ renal transplant - baseline creat 1.3- 1.5. Suspect due to low volume and some soft BP's.  Vanc toxicity less likely. Vancomycin stopped.  DC scheduled lasix and increase IVF's, has room for volume.   2. R leg ulcer/ cellulitis - on Rocephin and po doxy now, looks to be improving 3. Renal transplant - continue immunosuppressives, appear to be at  proper dosing 4. DM x 25 yrs, per primary 5. HTN - bp's soft off and on, will decrease  Plan - as above  Vinson Moselleob Yamir Carignan MD [facility] pager [phone]   06/28/2016, 8:10 PM

## 2016-06-28 NOTE — Progress Notes (Signed)
Notified RPh Erin in Pharmacy of patient's Vancomycin trough level of 60.

## 2016-06-28 NOTE — Consult Note (Signed)
WOC Nurse wound consult note Reason for Consult:chronic, non-healing leg ulcer Wound type: vasculitis Pressure Ulcer POA: No Measurement:4cm x 2.5cm x 0.4cm Wound bed:Red, moist with 20% thin yellow slough. Drainage (amount, consistency, odor)  Periwound: intact with evidence of previous wound healing Dressing procedure/placement/frequency: I will provide Nursing with guidance for the care of this wound using a calcium alginate absorbant dressing with topper.  Patient has healed several similar lesions in the past. WOC nursing team will not follow, but will remain available to this patient, the nursing and medical teams.  Please re-consult if needed. Thanks, Ladona MowLaurie Spurgeon Gancarz, MSN, RN, GNP, Hans EdenCWOCN, CWON-AP, FAAN  Pager# [phone]

## 2016-06-29 DIAGNOSIS — E162 Hypoglycemia, unspecified: Secondary | ICD-10-CM

## 2016-06-29 LAB — RENAL FUNCTION PANEL
ALBUMIN: 2.3 g/dL — AB (ref 3.5–5.0)
ANION GAP: 8 (ref 5–15)
BUN: 32 mg/dL — ABNORMAL HIGH (ref 6–20)
CO2: 22 mmol/L (ref 22–32)
Calcium: 9.3 mg/dL (ref 8.9–10.3)
Chloride: 107 mmol/L (ref 101–111)
Creatinine, Ser: 2.02 mg/dL — ABNORMAL HIGH (ref 0.44–1.00)
GFR calc Af Amer: 32 mL/min — ABNORMAL LOW (ref >60)
GFR, EST NON AFRICAN AMERICAN: 28 mL/min — AB (ref >60)
Glucose, Bld: 66 mg/dL (ref 65–99)
PHOSPHORUS: 3.6 mg/dL (ref 2.5–4.6)
POTASSIUM: 3.6 mmol/L (ref 3.5–5.1)
Sodium: 137 mmol/L (ref 135–145)

## 2016-06-29 LAB — GLUCOSE, CAPILLARY
GLUCOSE-CAPILLARY: 102 mg/dL — AB (ref 65–99)
GLUCOSE-CAPILLARY: 131 mg/dL — AB (ref 65–99)
GLUCOSE-CAPILLARY: 162 mg/dL — AB (ref 65–99)
Glucose-Capillary: 69 mg/dL (ref 65–99)
Glucose-Capillary: 85 mg/dL (ref 65–99)

## 2016-06-29 LAB — CBC
HEMATOCRIT: 36.9 % (ref 36.0–46.0)
HEMOGLOBIN: 11.8 g/dL — AB (ref 12.0–15.0)
MCH: 27.8 pg (ref 26.0–34.0)
MCHC: 32 g/dL (ref 30.0–36.0)
MCV: 86.8 fL (ref 78.0–100.0)
Platelets: 153 10*3/uL (ref 150–400)
RBC: 4.25 MIL/uL (ref 3.87–5.11)
RDW: 14.8 % (ref 11.5–15.5)
WBC: 4.6 10*3/uL (ref 4.0–10.5)

## 2016-06-29 LAB — SODIUM, URINE, RANDOM: SODIUM UR: 20 mmol/L

## 2016-06-29 LAB — CREATININE, URINE, RANDOM: Creatinine, Urine: 158.33 mg/dL

## 2016-06-29 LAB — VANCOMYCIN, TROUGH: Vancomycin Tr: 34 ug/mL (ref 15–20)

## 2016-06-29 MED ORDER — INSULIN ASPART 100 UNIT/ML ~~LOC~~ SOLN
0.0000 [IU] | Freq: Three times a day (TID) | SUBCUTANEOUS | Status: DC
  Administered 2016-06-29 – 2016-06-30 (×2): 1 [IU] via SUBCUTANEOUS
  Administered 2016-06-30: 3 [IU] via SUBCUTANEOUS
  Administered 2016-06-30: 7 [IU] via SUBCUTANEOUS

## 2016-06-29 MED ORDER — DOXYCYCLINE HYCLATE 100 MG PO TABS
100.0000 mg | ORAL_TABLET | Freq: Two times a day (BID) | ORAL | Status: DC
  Administered 2016-06-29 – 2016-06-30 (×2): 100 mg via ORAL
  Filled 2016-06-29 (×2): qty 1

## 2016-06-29 MED ORDER — DIPHENOXYLATE-ATROPINE 2.5-0.025 MG PO TABS
2.0000 | ORAL_TABLET | Freq: Once | ORAL | Status: AC
  Administered 2016-06-29: 2 via ORAL
  Filled 2016-06-29: qty 2

## 2016-06-29 MED ORDER — FAMOTIDINE 20 MG PO TABS
10.0000 mg | ORAL_TABLET | Freq: Two times a day (BID) | ORAL | Status: DC
  Administered 2016-06-29 – 2016-06-30 (×2): 10 mg via ORAL
  Filled 2016-06-29 (×3): qty 1

## 2016-06-29 MED ORDER — PREDNISONE 20 MG PO TABS
30.0000 mg | ORAL_TABLET | Freq: Every day | ORAL | Status: AC
  Administered 2016-06-29 – 2016-06-30 (×2): 30 mg via ORAL
  Filled 2016-06-29 (×2): qty 1

## 2016-06-29 MED ORDER — CARVEDILOL 6.25 MG PO TABS
6.2500 mg | ORAL_TABLET | Freq: Two times a day (BID) | ORAL | Status: DC
  Administered 2016-06-29 – 2016-06-30 (×2): 6.25 mg via ORAL
  Filled 2016-06-29 (×2): qty 1

## 2016-06-29 MED ORDER — PREDNISONE 5 MG PO TABS: 5.0000 mg | ORAL_TABLET | Freq: Every day | ORAL | Status: DC

## 2016-06-29 NOTE — Progress Notes (Signed)
Hypoglycemic Event  CBG:27  Treatment: 15 GM carbohydrate snack, ptaient ate really slow, had half sandwich and 2 OJ   Symptoms: Sweaty  Follow-up CBG: Time:2223 CBG Result23 Possible Reasons for Event: Inadequate meal intake  Comments/MD notified:no    [name] SkeansPeng, Bertram MillardHui Ping

## 2016-06-29 NOTE — Progress Notes (Signed)
Pharmacy Antibiotic Note  [name] is a 50 y.o. female with Hx renal txplant on TAC/MMA/prednisone, uncontrolled DM, hypothyroidism, admitted on 06/26/2016 with R calf ulcer which started ~1 month ago but surrounding has recently become red and painful.  Xray negative for osteomyelitis.  Pharmacy consulted vancomycin dosing - has been on hold for supratherapeutic levels with acute renal insufficiency, most recent dose was 1500mg  on 10/22 at ~6AM.  Plan:  Check random vancomycin level in AM 10/24, will re-dose if < 15  Monitor renal function closely  Height: 5\' 7"  (170.2 cm) Weight: 196 lb 6.9 oz (89.1 kg) IBW/kg (Calculated) : 61.6  Temp (24hrs), Avg:97.5 F (36.4 C), Min:97.5 F (36.4 C), Max:97.5 F (36.4 C)   Recent Labs Lab 06/26/16 1053 06/26/16 1119 06/27/16 0915 06/28/16 0612 06/28/16 0915 06/29/16 0530  WBC 7.3  --  5.4 5.1  --  4.6  CREATININE 1.39*  --  1.79* 2.29*  --  2.02*  LATICACIDVEN  --  1.28  --   --   --   --   VANCOTROUGH  --   --   --   --  60* 34*    Estimated Creatinine Clearance: 38.6 mL/min (by C-G formula based on SCr of 2.02 mg/dL (H)).    Allergies  Allergen Reactions  . Ace Inhibitors Cough  . Adhesive [Tape] Itching and Other (See Comments)    Please use paper tape  . Lisinopril Cough    Antimicrobials this admission: Zosyn 10/20 >> 10/22 Doxycycline 10/22 >> Ceftriaxone 10/22 >> Vancomycin 10/20 >> 10/22, resume 10/23 >>   Dose adjustments this admission: Vt level drawn on 10/22: elevated at 60 but was 4 hours after dose given. Med was dc'ed 10/23 VR = 34 (no longer on vanc); resume now that wound (+) MRSA 10/24 VR = ___   Microbiology results: 10/20 BCx: NGTD 10/21 R leg: abundant MRSA 10/21: MRSA PCR: negative 10/22: C. Diff: negative  Thank you for allowing pharmacy to be a part of this patient's care.  Loralee PacasErin Yanisa Goodgame, PharmD, BCPS Pager: [phone] 06/29/2016, 1:24 PM

## 2016-06-29 NOTE — Progress Notes (Signed)
PROGRESS NOTE    [name]  NWG:[id number] DOB: [birth year] DOA: 06/26/2016 PCP: Dois Davenport., MD    Brief Narrative:   [name] is a 50 y.o. female with medical history significant of renal transplant (2011) HTN, DM poorly controlled, hypothryoidism who fell in September and sustained an injury to her right calf which developed into an ulcer. She states her doctor has been helping to take care of it but over the past couple of days she's noticed the surrounding skin around the ulcer has become more red and painful. She hasn't noticed any discharge. No complaint of fever or chills. Her sugars are also noted to be elevated in the 500s. She states that they typically run in the 400s despite taking her medications appropriately.  ED Course: Glucose 560, BUN 35, Cr 1.39, CO2 21, Hb 11.3   Assessment & Plan:   Principal Problem:   Cellulitis Active Problems:   Acute renal failure superimposed on stage 3 chronic kidney disease (HCC)   Hypothyroidism   H/O kidney transplant   Uncontrolled type 1 diabetes mellitus with foot ulcer (HCC)   Hyperglycemia without ketosis   Insulin dependent diabetes mellitus (HCC)   CKD (chronic kidney disease) stage 3, GFR 30-59 ml/min   Cellulitis of right lower extremity   Hyperglycemia   Wound cellulitis   #1 right lower extremity wound with cellulitis Patient noted to be immunocompromised with poorly controlled diabetes. Wound cultures pending. Sedimentation rate within normal limits. CRP elevated. Plain films of the right tib-fib negative for osteomyelitis. Wound swabs positive for MRSA. Discontinued IV vancomycin and IV Zosyn. Continue IV Rocephin and oral doxycycline. On discharge will change IV Rocephin to oral Keflex in addition to oral doxycycline and treat for total of 7-10 days.  #2 poorly controlled diabetes mellitus Hemoglobin A1c 10.3 05/13/2016. CBGs improved yesterday from admission. CBGs on admission were in the 400s to  500s. Patient noted to have hypoglycemic spells overnight(10/22-23/2017) with CBG of 23 and 27 likely secondary to worsening renal function and increased dose of insulin. Will discontinue 7030 for now. Remain on sliding scale insulin today. If CBGs get higher will resume home dose 70/30.   #3 hypothyroidism Continue home dose Synthroid.  #4 history of kidney transplant Continue CellCept, prednisone, prograf.  #5 metabolic acidosis Secondary to acute on chronic kidney disease. Improved on bicarbonate drip.   #6 acute on chronic kidney disease stage III Patient noted to have worsening renal function creatinine currently at 2.02 from 2.29 from 1.79 yesterday from 1.39 on admission. Likely secondary to a prerenal azotemia as patient had some bouts of low blood pressure likely hypoperfusion from the kidney in addition to mild adrenal insufficiency. Patient states good urine output. Overnight trough was elevated at 60 however misleading as he was taking 3-4 hours after vancomycin was given. Will repeat overnight trough tomorrow morning.  IV fluids increased per nephrology yesterday with improvement in renal function. Prednisone dose has been increased per nephrology today. Discontinued IV vancomycin and IV Zosyn. Continue bicarbonate drip, CellCept, prednisone, Prograf. Nephrology following and appreciate input and recommendations.   #7 hypertension Stable. Continue current regimen of Norvasc, Coreg. Holding Lasix due to worsening renal function.  #8 gastroesophageal reflux disease Pepcid.   DVT prophylaxis: Lovenox Code Status: Full Family Communication: Updated patient. No family at bedside. Disposition Plan: Home when acute on chronic kidney disease has resolved and improvement with cellulitis.   Consultants:   Wound care 06/28/2016  Nephrology: Dr.Schertz 06/28/2016  Procedures:  None  Antimicrobials:   IV Zosyn 06/26/2016>>>>>>06/28/2016  IV vancomycin  06/26/2016>>>>>06/28/2016  IV Rocephin 06/28/2016  Oral doxycycline 06/28/2016   Subjective: Patient denies any chest pain. No shortness of breath. Patient noted to have CBGs as low as 23 and 27 with associated diaphoresis last night. Patient currently sitting up eating lunch.   Objective: Vitals:   06/28/16 1821 06/28/16 2100 06/29/16 0753 06/29/16 1417  BP:  (!) 145/61 (!) 121/55 134/60  Pulse:  70 76 73  Resp:  18 18 18   Temp:  97.5 F (36.4 C) 97.5 F (36.4 C) 97.7 F (36.5 C)  TempSrc:  Oral Axillary Oral  SpO2:  98% 99% 97%  Weight: 89.1 kg (196 lb 6.9 oz)     Height:        Intake/Output Summary (Last 24 hours) at 06/29/16 1608 Last data filed at 06/29/16 1200  Gross per 24 hour  Intake          1683.75 ml  Output                4 ml  Net          1679.75 ml   Filed Weights   06/26/16 1036 06/26/16 1542 06/28/16 1821  Weight: 88 kg (194 lb) 88.5 kg (195 lb) 89.1 kg (196 lb 6.9 oz)    Examination:  General exam: Appears calm and comfortable  Respiratory system: Clear to auscultation. Respiratory effort normal. Cardiovascular system: S1 & S2 heard, RRR. No JVD, murmurs, rubs, gallops or clicks. No pedal edema. Gastrointestinal system: Abdomen is nondistended, soft and nontender. No organomegaly or masses felt. Normal bowel sounds heard. Central nervous system: Alert and oriented. No focal neurological deficits. Extremities: Symmetric 5 x 5 power. Skin: Right lower extremity with ulcer/ wound noted with minimal surrounding erythema. No rashes, lesions or ulcers Psychiatry: Judgement and insight appear normal. Mood & affect appropriate.     Data Reviewed: I have personally reviewed following labs and imaging studies  CBC:  Recent Labs Lab 06/26/16 1053 06/27/16 0915 06/28/16 0612 06/29/16 0530  WBC 7.3 5.4 5.1 4.6  NEUTROABS  --  4.6 4.3  --   HGB 11.3* 12.7 11.7* 11.8*  HCT 35.9* 41.2 37.4 36.9  MCV 89.1 89.4 89.5 86.8  PLT 196 191 164 153    Basic Metabolic Panel:  Recent Labs Lab 06/26/16 1053 06/27/16 0915 06/27/16 1753 06/28/16 0612 06/29/16 0530  NA 132* 138  --  137 137  K 4.2 4.3  --  4.1 3.6  CL 102 113*  --  111 107  CO2 21* 18*  --  15* 22  GLUCOSE 560* 221* 50* 200* 66  BUN 35* 30*  --  32* 32*  CREATININE 1.39* 1.79*  --  2.29* 2.02*  CALCIUM 9.7 9.0  --  9.0 9.3  MG  --  1.3*  --   --   --   PHOS  --   --   --   --  3.6   GFR: Estimated Creatinine Clearance: 38.6 mL/min (by C-G formula based on SCr of 2.02 mg/dL (H)). Liver Function Tests:  Recent Labs Lab 06/29/16 0530  ALBUMIN 2.3*   No results for input(s): LIPASE, AMYLASE in the last 168 hours. No results for input(s): AMMONIA in the last 168 hours. Coagulation Profile: No results for input(s): INR, PROTIME in the last 168 hours. Cardiac Enzymes: No results for input(s): CKTOTAL, CKMB, CKMBINDEX, TROPONINI in the last 168 hours. BNP (last 3 results) No  results for input(s): PROBNP in the last 8760 hours. HbA1C: No results for input(s): HGBA1C in the last 72 hours. CBG:  Recent Labs Lab 06/28/16 2223 06/28/16 2240 06/29/16 0631 06/29/16 0807 06/29/16 1142  GLUCAP 23* 93 69 85 102*   Lipid Profile: No results for input(s): CHOL, HDL, LDLCALC, TRIG, CHOLHDL, LDLDIRECT in the last 72 hours. Thyroid Function Tests: No results for input(s): TSH, T4TOTAL, FREET4, T3FREE, THYROIDAB in the last 72 hours. Anemia Panel: No results for input(s): VITAMINB12, FOLATE, FERRITIN, TIBC, IRON, RETICCTPCT in the last 72 hours. Sepsis Labs:  Recent Labs Lab 06/26/16 1119  LATICACIDVEN 1.28    Recent Results (from the past 240 hour(s))  Culture, blood (routine x 2)     Status: None (Preliminary result)   Collection Time: 06/26/16  1:24 PM  Result Value Ref Range Status   Specimen Description BLOOD RIGHT HAND  Final   Special Requests IN PEDIATRIC BOTTLE 3CC  Final   Culture   Final    NO GROWTH 3 DAYS Performed at [facility]    Report Status PENDING  Incomplete  MRSA PCR Screening     Status: None   Collection Time: 06/27/16  6:37 AM  Result Value Ref Range Status   MRSA by PCR NEGATIVE NEGATIVE Final    Comment:        The GeneXpert MRSA Assay (FDA approved for NASAL specimens only), is one component of a comprehensive MRSA colonization surveillance program. It is not intended to diagnose MRSA infection nor to guide or monitor treatment for MRSA infections.   Aerobic Culture (superficial specimen)     Status: None (Preliminary result)   Collection Time: 06/27/16  1:59 PM  Result Value Ref Range Status   Specimen Description LEG RIGHT  Final   Special Requests NONE  Final   Gram Stain   Final    MODERATE WBC PRESENT, PREDOMINANTLY PMN ABUNDANT GRAM POSITIVE COCCI IN PAIRS IN CLUSTERS Performed at [facility]    Culture   Final    ABUNDANT METHICILLIN RESISTANT STAPHYLOCOCCUS AUREUS   Report Status PENDING  Incomplete   Organism ID, Bacteria METHICILLIN RESISTANT STAPHYLOCOCCUS AUREUS  Final      Susceptibility   Methicillin resistant staphylococcus aureus - MIC*    CIPROFLOXACIN <=0.5 SENSITIVE Sensitive     ERYTHROMYCIN <=0.25 SENSITIVE Sensitive     GENTAMICIN <=0.5 SENSITIVE Sensitive     OXACILLIN >=4 RESISTANT Resistant     TETRACYCLINE <=1 SENSITIVE Sensitive     VANCOMYCIN 1 SENSITIVE Sensitive     TRIMETH/SULFA <=10 SENSITIVE Sensitive     CLINDAMYCIN <=0.25 SENSITIVE Sensitive     RIFAMPIN <=0.5 SENSITIVE Sensitive     Inducible Clindamycin NEGATIVE Sensitive     * ABUNDANT METHICILLIN RESISTANT STAPHYLOCOCCUS AUREUS  C difficile quick scan w PCR reflex     Status: None   Collection Time: 06/28/16  6:00 AM  Result Value Ref Range Status   C Diff antigen NEGATIVE NEGATIVE Final   C Diff toxin NEGATIVE NEGATIVE Final   C Diff interpretation No C. difficile detected.  Final         Radiology Studies: No results found.      Scheduled Meds: . aspirin  EC  81 mg Oral Daily  . carvedilol  6.25 mg Oral BID  . cefTRIAXone (ROCEPHIN)  IV  2 g Intravenous Q24H  . cholestyramine  4 g Oral BID  . doxycycline  100 mg Oral Q12H  . enoxaparin (LOVENOX)  injection  40 mg Subcutaneous Q24H  . famotidine  10 mg Oral BID  . insulin aspart  0-9 Units Subcutaneous TID WC  . mycophenolate  500 mg Oral BID  . predniSONE  30 mg Oral Q breakfast  . [START ON 07/01/2016] predniSONE  5 mg Oral Q breakfast  . levothyroxine  137 mcg Oral QAC breakfast  . tacrolimus  0.5 mg Oral QHS  . tacrolimus  1 mg Oral Daily   Continuous Infusions: .  sodium bicarbonate 150 mEq in sterile water 1000 mL infusion 50 mL/hr at 06/29/16 1026     LOS: 2 days    Time spent: 35 mins    Matilde Markie, MD [facility] Pager [phone]  If 7PM-7AM, please contact night-coverage [email or web address] Password [email or web address] 06/29/2016, 4:08 PM

## 2016-06-29 NOTE — Progress Notes (Signed)
Subjective: Interval History: has complaints has D.  Objective: Vital signs in last 24 hours: Temp:  [97.5 F (36.4 C)-98 F (36.7 C)] 97.5 F (36.4 C) (10/23 0753) Pulse Rate:  [70-76] 76 (10/23 0753) Resp:  [18] 18 (10/23 0753) BP: (121-145)/(55-72) 121/55 (10/23 0753) SpO2:  [98 %-100 %] 99 % (10/23 0753) Weight:  [89.1 kg (196 lb 6.9 oz)] 89.1 kg (196 lb 6.9 oz) (10/22 1821) Weight change:   Intake/Output from previous day: 10/22 0701 - 10/23 0700 In: 2168.8 [I.V.:2118.8; IV Piggyback:50] Out: 5 [Urine:3; Stool:2] Intake/Output this shift: Total I/O In: 240 [P.O.:240] Out: 2 [Urine:2]  General appearance: alert, cooperative, moderately obese and pale Resp: clear to auscultation bilaterally Cardio: S1, S2 normal and systolic murmur: systolic ejection 2/6, decrescendo at 2nd left intercostal space GI: obese, pos bs, liver down 4 cm TX RLQ.  Extremities: edema 1+ and Ulcer post R mid calf  Lab Results:  Recent Labs  06/28/16 0612 06/29/16 0530  WBC 5.1 4.6  HGB 11.7* 11.8*  HCT 37.4 36.9  PLT 164 153   BMET:  Recent Labs  06/28/16 0612 06/29/16 0530  NA 137 137  K 4.1 3.6  CL 111 107  CO2 15* 22  GLUCOSE 200* 66  BUN 32* 32*  CREATININE 2.29* 2.02*  CALCIUM 9.0 9.3   No results for input(s): PTH in the last 72 hours. Iron Studies: No results for input(s): IRON, TIBC, TRANSFERRIN, FERRITIN in the last 72 hours.  Studies/Results: Dg Tibia/fibula Right  Result Date: 06/27/2016 CLINICAL DATA:  Wound cellulitis.  Rule out osteomyelitis. EXAM: RIGHT TIBIA AND FIBULA - 2 VIEW COMPARISON:  05/27/2011 FINDINGS: Again noted are skin densities or calcifications along the lateral aspect of the lower leg. There is no evidence for a fracture or dislocation. Diffuse vascular calcifications. No evidence for periosteal reaction or cortical destruction. IMPRESSION: No acute abnormality. Electronically Signed   By: Richarda OverlieAdam  Henn M.D.   On: 06/27/2016 13:42    I have  reviewed the patient's current medications.  Assessment/Plan: 1 renal Transplant  Cr better.  Suspect this was Mild Adrenal insuffic causing low bps, and hypoperfusion of kidney 2 Adrenal insuffic 3 Leg ulcer, will check PTH and needs Dopplers 4 Obesity 5 DM per primary  6 anemia 7 ^ lipids P PTH, lower ivf, cont bicarb, Pred    LOS: 2 days   Ronalee Scheunemann L 06/29/2016,10:47 AM

## 2016-06-29 NOTE — Progress Notes (Signed)
Hypoglycemic Event  CBG:21  Treatment: D50 IV 50 mL  Symptoms: Sweaty  Follow-up CBG: Time:2240 CBG Result93  Possible Reasons for Event:   Comments/MD notified:no    [name] SkeansPeng, [name]

## 2016-06-29 NOTE — Progress Notes (Signed)
Inpatient Diabetes Program Recommendations  AACE/ADA: New Consensus Statement on Inpatient Glycemic Control (2015)  Target Ranges:  Prepandial:   less than 140 mg/dL      Peak postprandial:   less than 180 mg/dL (1-2 hours)      Critically ill patients:  140 - 180 mg/dL   Lab Results  Component Value Date   GLUCAP 85 06/29/2016   HGBA1C 10.3 (H) 05/13/2016    Review of Glycemic Control  Hypoglycemia. Needs insulin adjustment.  Inpatient Diabetes Program Recommendations:    Decrease 70/30 to 30 units bid.(Home dose)  Will closely follow.  Thank you. Ailene Ardshonda Brailen Macneal, RD, LDN, CDE Inpatient Diabetes Coordinator [phone]

## 2016-06-30 ENCOUNTER — Encounter (HOSPITAL_COMMUNITY): Payer: Self-pay

## 2016-06-30 ENCOUNTER — Inpatient Hospital Stay (HOSPITAL_COMMUNITY): Payer: Medicare Other

## 2016-06-30 DIAGNOSIS — L03115 Cellulitis of right lower limb: Secondary | ICD-10-CM

## 2016-06-30 DIAGNOSIS — E119 Type 2 diabetes mellitus without complications: Secondary | ICD-10-CM

## 2016-06-30 DIAGNOSIS — Z794 Long term (current) use of insulin: Secondary | ICD-10-CM

## 2016-06-30 DIAGNOSIS — R739 Hyperglycemia, unspecified: Secondary | ICD-10-CM

## 2016-06-30 LAB — GLUCOSE, CAPILLARY
GLUCOSE-CAPILLARY: 233 mg/dL — AB (ref 65–99)
Glucose-Capillary: 307 mg/dL — ABNORMAL HIGH (ref 65–99)
Glucose-Capillary: 135 mg/dL — ABNORMAL HIGH (ref 65–99)

## 2016-06-30 LAB — RENAL FUNCTION PANEL
ALBUMIN: 2.7 g/dL — AB (ref 3.5–5.0)
ANION GAP: 15 (ref 5–15)
BUN: 27 mg/dL — AB (ref 6–20)
CHLORIDE: 102 mmol/L (ref 101–111)
CO2: 20 mmol/L — ABNORMAL LOW (ref 22–32)
Calcium: 9.3 mg/dL (ref 8.9–10.3)
Creatinine, Ser: 1.78 mg/dL — ABNORMAL HIGH (ref 0.44–1.00)
GFR calc Af Amer: 38 mL/min — ABNORMAL LOW (ref >60)
GFR, EST NON AFRICAN AMERICAN: 32 mL/min — AB (ref >60)
Glucose, Bld: 313 mg/dL — ABNORMAL HIGH (ref 65–99)
PHOSPHORUS: 3.8 mg/dL (ref 2.5–4.6)
POTASSIUM: 4.6 mmol/L (ref 3.5–5.1)
Sodium: 137 mmol/L (ref 135–145)

## 2016-06-30 LAB — CBC
HEMATOCRIT: 38.2 % (ref 36.0–46.0)
HEMOGLOBIN: 12.4 g/dL (ref 12.0–15.0)
MCH: 28.2 pg (ref 26.0–34.0)
MCHC: 32.5 g/dL (ref 30.0–36.0)
MCV: 86.8 fL (ref 78.0–100.0)
Platelets: 135 10*3/uL — ABNORMAL LOW (ref 150–400)
RBC: 4.4 MIL/uL (ref 3.87–5.11)
RDW: 14.8 % (ref 11.5–15.5)
WBC: 3.9 10*3/uL — AB (ref 4.0–10.5)

## 2016-06-30 LAB — PARATHYROID HORMONE, INTACT (NO CA): PTH: 175 pg/mL — AB (ref 15–65)

## 2016-06-30 LAB — URINE CULTURE

## 2016-06-30 LAB — AEROBIC CULTURE W GRAM STAIN (SUPERFICIAL SPECIMEN)

## 2016-06-30 LAB — AEROBIC CULTURE  (SUPERFICIAL SPECIMEN)

## 2016-06-30 MED ORDER — AMLODIPINE BESYLATE 5 MG PO TABS: 5.0000 mg | ORAL_TABLET | Freq: Every day | ORAL | Status: DC

## 2016-06-30 MED ORDER — CEPHALEXIN 500 MG PO CAPS
500.0000 mg | ORAL_CAPSULE | Freq: Two times a day (BID) | ORAL | Status: DC
  Administered 2016-06-30: 500 mg via ORAL
  Filled 2016-06-30: qty 1

## 2016-06-30 MED ORDER — HYDROCODONE-ACETAMINOPHEN 5-325 MG PO TABS: 1.0000 | ORAL_TABLET | Freq: Four times a day (QID) | ORAL | 0 refills | Status: DC | PRN

## 2016-06-30 MED ORDER — LOPERAMIDE HCL 2 MG PO CAPS
4.0000 mg | ORAL_CAPSULE | Freq: Once | ORAL | Status: AC
Start: 2016-06-30 — End: 2016-06-30
  Administered 2016-06-30: 4 mg via ORAL
  Filled 2016-06-30: qty 2

## 2016-06-30 MED ORDER — DOXYCYCLINE HYCLATE 100 MG PO TABS: 100.0000 mg | ORAL_TABLET | Freq: Two times a day (BID) | ORAL | 0 refills | Status: AC

## 2016-06-30 MED ORDER — AMLODIPINE BESYLATE 5 MG PO TABS
5.0000 mg | ORAL_TABLET | Freq: Every day | ORAL | Status: DC
  Filled 2016-06-30: qty 1

## 2016-06-30 MED ORDER — CHOLESTYRAMINE 4 G PO PACK: 4.0000 g | PACK | Freq: Two times a day (BID) | ORAL | 0 refills | Status: DC

## 2016-06-30 MED ORDER — CEPHALEXIN 500 MG PO CAPS: 500.0000 mg | ORAL_CAPSULE | Freq: Two times a day (BID) | ORAL | 0 refills | Status: AC

## 2016-06-30 MED ORDER — INSULIN ASPART PROT & ASPART (70-30 MIX) 100 UNIT/ML ~~LOC~~ SUSP
30.0000 [IU] | Freq: Two times a day (BID) | SUBCUTANEOUS | Status: DC
  Administered 2016-06-30 (×2): 30 [IU] via SUBCUTANEOUS

## 2016-06-30 MED ORDER — INSULIN NPH ISOPHANE & REGULAR (70-30) 100 UNIT/ML ~~LOC~~ SUSP: 30.0000 [IU] | Freq: Two times a day (BID) | SUBCUTANEOUS | Status: DC

## 2016-06-30 MED ORDER — CARVEDILOL 6.25 MG PO TABS
6.2500 mg | ORAL_TABLET | Freq: Two times a day (BID) | ORAL | 0 refills | Status: DC
Start: 2016-06-30 — End: 2016-10-09

## 2016-06-30 NOTE — Progress Notes (Signed)
VASCULAR LAB PRELIMINARY  ARTERIAL  ABI completed:    RIGHT    LEFT    PRESSURE WAVEFORM  PRESSURE WAVEFORM  BRACHIAL 226 Triphasic BRACHIAL 182 Triphasic  DP Noncompressible Dampened monophasic DP Noncompressible Dampened monophasic  AT   AT    PT Noncompressible Monophasic PT Noncompressible Monophasic  PER   PER    TOE 232 NA GREAT TOE 232 NA    RIGHT LEFT  ABI - -   Bilateral lower extremity pedal arteries are noncompressible, suggestive of medial arterial calcification. Therefore, ABIs cannot be calculated. Pedal waveforms suggest moderate to severe arterial insufficiency. Due to history of amputations, the right fourth toe and the left second toe were used to obtain PPG waveforms and pressures. Bilateral toes exhibited a discernable waveform, however pressures are possibly elevated due to medial calcification.  06/30/2016 3:39 PM [name], BS, RVT, RDCS, RDMS

## 2016-06-30 NOTE — Discharge Summary (Signed)
Physician Discharge Summary  LORAN AUGUSTE ZOX:[id number] DOB: Feb 11, 1966 DOA: 06/26/2016  PCP: Dois Davenport., MD  Admit date: 06/26/2016 Discharge date: 06/30/2016  Time spent: 60 minutes  Recommendations for Outpatient Follow-up:  1. Follow-up with Dr. Arrie Aran, nephrology in 1-2 weeks. All follow-up patient in need a renal panel done to follow-up on renal function and electrolytes. 2. Follow-up with RICHTER,KAREN L., MD in 1-2 weeks for follow-up on right lower extremity wound. She is being discharged on oral doxycycline and Keflex. ABIs which were done this suggest moderate to severe arterial insufficiency and medial arterial calcification. Patient may benefit from referral to vascular surgery.   Discharge Diagnoses:  Principal Problem:   Cellulitis Active Problems:   Acute renal failure superimposed on stage 3 chronic kidney disease (HCC)   Hypothyroidism   H/O kidney transplant   Uncontrolled type 1 diabetes mellitus with foot ulcer (HCC)   Hyperglycemia without ketosis   Insulin dependent diabetes mellitus (HCC)   CKD (chronic kidney disease) stage 3, GFR 30-59 ml/min   Cellulitis of right lower extremity   Hyperglycemia   Wound cellulitis   Hypoglycemia   Discharge Condition: Stable and improved  Diet recommendation: Carb modified diet  Filed Weights   06/28/16 1821 06/29/16 1800 06/30/16 0513  Weight: 89.1 kg (196 lb 6.9 oz) 89.7 kg (197 lb 12 oz) 89 kg (196 lb 3.4 oz)    History of present illness:  Per Dr Huey Bienenstock is a 50 y.o. female with medical history significant of renal transplant (2011) HTN, DM poorly controlled, hypothryoidism who fell in September and sustained an injury to her right calf which developed into an ulcer. She stated her doctor had been helping to take care of it but over the past couple of days she's noticed the surrounding skin around the ulcer has become more red and painful. She hasn't noticed any discharge. No  complaint of fever or chills. Her sugars were also noted to be elevated in the 500s. She stated that they typically run in the 400s despite taking her medications appropriately.  ED Course: Glucose 560, BUN 35, Cr 1.39, CO2 21, Hb 11.3   Hospital Course:  #1 right lower extremity wound with cellulitis Patient had presented with a right lower extremity wound with cellulitis initially placed empirically on IV vancomycin and IV Zosyn. Patient noted to be immunocompromised with poorly controlled diabetes. Wound cultures were obtained which came back positive for MRSA. Sedimentation rate within normal limits. CRP elevated. Plain films of the right tib-fib negative for osteomyelitis. Wound swabs positive for MRSA. Discontinued IV vancomycin and IV Zosyn. Patient was subsequently placed on IV Rocephin on oral doxycycline which she tolerated. Patient improved clinically. ABIs were obtained with preliminary findings showing medial arterial calcification, moderate to severe arterial insufficiency. Patient was subsequently transitioned to oral Keflex and will be discharged home on oral Keflex and oral doxycycline for 4 more days to complete a one-week course of antibiotic treatment. Outpatient follow-up with PCP.  #2 poorly controlled diabetes mellitus Hemoglobin A1c 10.3 05/13/2016. Patient was noted to have CBGs in the 400s to 500s on admission. Patient's 70/30 dose was increased to 40 units twice daily. However  Patient noted to have hypoglycemic spells overnight(10/22-23/2017) with CBG of 23 and 27 likely secondary to worsening renal function and increased dose of insulin. 70/30 was subsequently discontinued and patient was maintained on a sliding scale insulin. As patient's renal function improve her blood glucose levels improved. Patient was resumed back on her home  regimen of 70/30 at 30 units twice daily. Outpatient follow-up.   #3 hypothyroidism Continued on home dose Synthroid.  #4 history of  kidney transplant Continued CellCept, prednisone, prograf.  #5 metabolic acidosis Secondary to acute on chronic kidney disease. Improved on bicarbonate drip.   #6 acute on chronic kidney disease stage III Patient noted to have worsening renal function during the hospitalization with a creatinine going up as high as 2.29 from 1.39 on admission. Nephrology was consulted.  Likely secondary to a prerenal azotemia as patient had some bouts of low blood pressure likely hypoperfusion from the kidney in addition to mild adrenal insufficiency. Patient states good urine output. Overnight trough was elevated at 60 however misleading as he was taking 3-4 hours after vancomycin was given.  Patient was assessed by nephrology who increased IV fluids. Lasix was discontinued. Patient's Coreg was decreased to 6.25 mg twice a day for better renal perfusion. Patient's renal function improved such that by day of discharge the creatinine was down to 1.78. Patient was given 2 days of increased doses of prednisone and on discharge prednisone decrease back to home regimen of 5 mg daily. Patient was also maintained on immunosuppressive regimen of CellCept, prednisone, Prograf during the hospitalization. Was recommended that patient follow-up with her nephrologist in the outpatient setting. Patient be discharged in stable and improved condition.   #7 hypertension Stable. Patient's Lasix was held during the hospitalization due to worsening renal function. Patient was maintained on a home regimen of Norvasc and Coreg dose decreased per nephrology for better renal perfusion. Patient was discharged home on home regimen of Norvasc and a decreased dose of Coreg. Outpatient follow-up.   #8 gastroesophageal reflux disease Patient was maintained on Pepcid. On discharge patient will be resumed back on a home regimen of Zantac.   Procedures:  ABI 06/30/2016  Consultations:  Wound care 06/28/2016  Nephrology: Dr.Schertz  06/28/2016  Discharge Exam: Vitals:   06/30/16 0513 06/30/16 1451  BP: (!) 177/74 (!) 170/83  Pulse: 78 80  Resp: 17 17  Temp: 98 F (36.7 C) 98.6 F (37 C)    General: NAD Cardiovascular: RRR Respiratory: CTAB  Discharge Instructions   Discharge Instructions    Diet Carb Modified    Complete by:  As directed    Increase activity slowly    Complete by:  As directed      Current Discharge Medication List    START taking these medications   Details  cephALEXin (KEFLEX) 500 MG capsule Take 1 capsule (500 mg total) by mouth every 12 (twelve) hours. Take for 4 days then stop. Qty: 8 capsule, Refills: 0    cholestyramine (QUESTRAN) 4 g packet Take 1 packet (4 g total) by mouth 2 (two) times daily. Take for 10 days then stop. Qty: 60 each, Refills: 0    doxycycline (VIBRA-TABS) 100 MG tablet Take 1 tablet (100 mg total) by mouth every 12 (twelve) hours. Take for 4 days, then stop. Qty: 8 tablet, Refills: 0    HYDROcodone-acetaminophen (NORCO/VICODIN) 5-325 MG tablet Take 1-2 tablets by mouth every 6 (six) hours as needed for severe pain or moderate pain. Qty: 15 tablet, Refills: 0      CONTINUE these medications which have CHANGED   Details  carvedilol (COREG) 6.25 MG tablet Take 1 tablet (6.25 mg total) by mouth 2 (two) times daily. Qty: 60 tablet, Refills: 0    insulin NPH-regular Human (NOVOLIN 70/30) (70-30) 100 UNIT/ML injection Inject 30 Units into the skin 2 (two)  times daily with a meal.      CONTINUE these medications which have NOT CHANGED   Details  acetaminophen (TYLENOL) 325 MG tablet Take 2 tablets (650 mg total) by mouth every 6 (six) hours as needed for mild pain, moderate pain, fever or headache.    amLODipine (NORVASC) 5 MG tablet Take 5 mg by mouth daily.    aspirin EC 81 MG tablet Take 81 mg by mouth daily.    insulin aspart (NOVOLOG) 100 UNIT/ML injection Before each meal 3 times a day, 140-199 - 2 units, 200-250 - 4 units, 251-299 - 6 units,   300-349 - 8 units,  350 or above 10 units. Insulin PEN if approved, provide syringes and needles if needed. Qty: 10 mL, Refills: 0    levothyroxine (SYNTHROID, LEVOTHROID) 137 MCG tablet Take 1 tablet (137 mcg total) by mouth daily. BRAND NAME ONLY Qty: 30 tablet, Refills: 1    mycophenolate (CELLCEPT) 250 MG capsule Take 500 mg by mouth 2 (two) times daily.     predniSONE (DELTASONE) 5 MG tablet Take 5 mg by mouth daily. Refills: 6    ranitidine (ZANTAC) 150 MG tablet Take 150 mg by mouth daily.    !! tacrolimus (PROGRAF) 0.5 MG capsule Take 0.5 mg by mouth every morning.     !! tacrolimus (PROGRAF) 1 MG capsule Take 1 mg by mouth at bedtime.    SSD 1 % cream Apply 1 application topically daily.      !! - Potential duplicate medications found. Please discuss with provider.     Allergies  Allergen Reactions  . Ace Inhibitors Cough  . Adhesive [Tape] Itching and Other (See Comments)    Please use paper tape  . Lisinopril Cough   Follow-up Information    RICHTER,KAREN L., MD. Schedule an appointment as soon as possible for a visit in 1 week(s).   Specialty:  Family Medicine Why:  f/u in 1-2 weeks. Contact information: [address] [place] [address] [phone]        [name], MD. Schedule an appointment as soon as possible for a visit in 1 week(s).   Specialty:  Nephrology Why:  f/u in 1-2 weeks. Contact information: [address] [place] [address] [phone]            The results of significant diagnostics from this hospitalization (including imaging, microbiology, ancillary and laboratory) are listed below for reference.    Significant Diagnostic Studies: Dg Tibia/fibula Right  Result Date: 06/27/2016 CLINICAL DATA:  Wound cellulitis.  Rule out osteomyelitis. EXAM: RIGHT TIBIA AND FIBULA - 2 VIEW COMPARISON:  05/27/2011 FINDINGS: Again noted are skin densities or calcifications along the lateral aspect of the lower leg. There  is no evidence for a fracture or dislocation. Diffuse vascular calcifications. No evidence for periosteal reaction or cortical destruction. IMPRESSION: No acute abnormality. Electronically Signed   By: Richarda Overlie M.D.   On: 06/27/2016 13:42    Microbiology: Recent Results (from the past 240 hour(s))  Culture, blood (routine x 2)     Status: None (Preliminary result)   Collection Time: 06/26/16  1:24 PM  Result Value Ref Range Status   Specimen Description BLOOD RIGHT HAND  Final   Special Requests IN PEDIATRIC BOTTLE 3CC  Final   Culture   Final    NO GROWTH 4 DAYS Performed at [facility]    Report Status PENDING  Incomplete  MRSA PCR Screening     Status: None  Collection Time: 06/27/16  6:37 AM  Result Value Ref Range Status   MRSA by PCR NEGATIVE NEGATIVE Final    Comment:        The GeneXpert MRSA Assay (FDA approved for NASAL specimens only), is one component of a comprehensive MRSA colonization surveillance program. It is not intended to diagnose MRSA infection nor to guide or monitor treatment for MRSA infections.   Aerobic Culture (superficial specimen)     Status: None   Collection Time: 06/27/16  1:59 PM  Result Value Ref Range Status   Specimen Description LEG RIGHT  Final   Special Requests NONE  Final   Gram Stain   Final    MODERATE WBC PRESENT, PREDOMINANTLY PMN ABUNDANT GRAM POSITIVE COCCI IN PAIRS IN CLUSTERS Performed at [facility]    Culture   Final    ABUNDANT METHICILLIN RESISTANT STAPHYLOCOCCUS AUREUS   Report Status 06/30/2016 FINAL  Final   Organism ID, Bacteria METHICILLIN RESISTANT STAPHYLOCOCCUS AUREUS  Final      Susceptibility   Methicillin resistant staphylococcus aureus - MIC*    CIPROFLOXACIN <=0.5 SENSITIVE Sensitive     ERYTHROMYCIN <=0.25 SENSITIVE Sensitive     GENTAMICIN <=0.5 SENSITIVE Sensitive     OXACILLIN >=4 RESISTANT Resistant     TETRACYCLINE <=1 SENSITIVE Sensitive     VANCOMYCIN 1 SENSITIVE Sensitive      TRIMETH/SULFA <=10 SENSITIVE Sensitive     CLINDAMYCIN <=0.25 SENSITIVE Sensitive     RIFAMPIN <=0.5 SENSITIVE Sensitive     Inducible Clindamycin NEGATIVE Sensitive     * ABUNDANT METHICILLIN RESISTANT STAPHYLOCOCCUS AUREUS  C difficile quick scan w PCR reflex     Status: None   Collection Time: 06/28/16  6:00 AM  Result Value Ref Range Status   C Diff antigen NEGATIVE NEGATIVE Final   C Diff toxin NEGATIVE NEGATIVE Final   C Diff interpretation No C. difficile detected.  Final  Stool culture     Status: None (Preliminary result)   Collection Time: 06/28/16  6:00 AM  Result Value Ref Range Status   Salmonella/Shigella Screen Final report  Final    Comment: (NOTE) Performed At: [facility] [address] [place], [place] [id number] Mila Homer MD [id number]    Campylobacter Culture PENDING  Incomplete   E coli, Shiga toxin Assay Negative Negative Final    Comment: (NOTE) Performed At: [facility] [address] [place], [place] [id number] Mila Homer MD [id number]   STOOL CULTURE REFLEX - RSASHR     Status: None   Collection Time: 06/28/16  6:00 AM  Result Value Ref Range Status   Stool Culture result 1 (RSASHR) Comment  Final    Comment: (NOTE) No Salmonella or Shigella recovered. Performed At: [facility] [address] [place], [place] [id number] Mila Homer MD [id number]   Culture, Urine     Status: Abnormal   Collection Time: 06/28/16  8:40 PM  Result Value Ref Range Status   Specimen Description URINE, RANDOM  Final   Special Requests NONE  Final   Culture (A)  Final    60,000 COLONIES/mL YEAST Performed at [facility]    Report Status 06/30/2016 FINAL  Final     Labs: Basic Metabolic Panel:  Recent Labs Lab 06/26/16 1053 06/27/16 0915 06/27/16 1753 06/28/16 0612 06/29/16 0530 06/30/16 0601  NA 132* 138  --  137 137 137  K 4.2 4.3  --  4.1 3.6 4.6  CL 102 113*  --  111 107 102  CO2 21*  18*  --  15* 22 20*  GLUCOSE 560* 221* 50* 200* 66 313*  BUN 35* 30*  --  32* 32* 27*  CREATININE 1.39* 1.79*  --  2.29* 2.02* 1.78*  CALCIUM 9.7 9.0  --  9.0 9.3 9.3  MG  --  1.3*  --   --   --   --   PHOS  --   --   --   --  3.6 3.8   Liver Function Tests:  Recent Labs Lab 06/29/16 0530 06/30/16 0601  ALBUMIN 2.3* 2.7*   No results for input(s): LIPASE, AMYLASE in the last 168 hours. No results for input(s): AMMONIA in the last 168 hours. CBC:  Recent Labs Lab 06/26/16 1053 06/27/16 0915 06/28/16 0612 06/29/16 0530 06/30/16 0601  WBC 7.3 5.4 5.1 4.6 3.9*  NEUTROABS  --  4.6 4.3  --   --   HGB 11.3* 12.7 11.7* 11.8* 12.4  HCT 35.9* 41.2 37.4 36.9 38.2  MCV 89.1 89.4 89.5 86.8 86.8  PLT 196 191 164 153 135*   Cardiac Enzymes: No results for input(s): CKTOTAL, CKMB, CKMBINDEX, TROPONINI in the last 168 hours. BNP: BNP (last 3 results) No results for input(s): BNP in the last 8760 hours.  ProBNP (last 3 results) No results for input(s): PROBNP in the last 8760 hours.  CBG:  Recent Labs Lab 06/29/16 1142 06/29/16 1645 06/29/16 2159 06/30/16 0759 06/30/16 1130  GLUCAP 102* 131* 162* 307* 233*       Signed:  Romi Rathel MD.  [facility] 06/30/2016, 4:52 PM

## 2016-06-30 NOTE — Progress Notes (Signed)
Subjective: Interval History: has complaints wants to go home.  Objective: Vital signs in last 24 hours: Temp:  [97.7 F (36.5 C)-98 F (36.7 C)] 98 F (36.7 C) (10/24 0513) Pulse Rate:  [73-81] 78 (10/24 0513) Resp:  [17-18] 17 (10/24 0513) BP: (134-177)/(60-74) 177/74 (10/24 0513) SpO2:  [97 %-98 %] 98 % (10/24 0513) Weight:  [89 kg (196 lb 3.4 oz)-89.7 kg (197 lb 12 oz)] 89 kg (196 lb 3.4 oz) (10/24 0513) Weight change: 0.6 kg (1 lb 5.2 oz)  Intake/Output from previous day: 10/23 0701 - 10/24 0700 In: 480 [P.O.:480] Out: 3 [Urine:3] Intake/Output this shift: No intake/output data recorded.  General appearance: alert, cooperative, moderately obese and pale Resp: clear to auscultation bilaterally Cardio: S1, S2 normal and systolic murmur: holosystolic 2/6, blowing at apex GI: obese, pos bs, bruises, TX RLQ normal size Extremities: ulcer pos R mid calf  Lab Results:  Recent Labs  06/29/16 0530 06/30/16 0601  WBC 4.6 3.9*  HGB 11.8* 12.4  HCT 36.9 38.2  PLT 153 135*   BMET:  Recent Labs  06/29/16 0530 06/30/16 0601  NA 137 137  K 3.6 4.6  CL 107 102  CO2 22 20*  GLUCOSE 66 313*  BUN 32* 27*  CREATININE 2.02* 1.78*  CALCIUM 9.3 9.3    Recent Labs  06/29/16 1102  PTH 175*   Iron Studies: No results for input(s): IRON, TIBC, TRANSFERRIN, FERRITIN in the last 72 hours.  Studies/Results: No results found.  I have reviewed the patient's current medications. Prior to Admission:  Prescriptions Prior to Admission  Medication Sig Dispense Refill Last Dose  . acetaminophen (TYLENOL) 325 MG tablet Take 2 tablets (650 mg total) by mouth every 6 (six) hours as needed for mild pain, moderate pain, fever or headache.   06/25/2016 at Unknown time  . amLODipine (NORVASC) 5 MG tablet Take 5 mg by mouth daily.   06/25/2016 at Unknown time  . aspirin EC 81 MG tablet Take 81 mg by mouth daily.   06/25/2016 at Unknown time  . carvedilol (COREG) 25 MG tablet Take 2  tablets (50 mg total) by mouth 2 (two) times daily.   06/25/2016 at 2000  . insulin aspart (NOVOLOG) 100 UNIT/ML injection Before each meal 3 times a day, 140-199 - 2 units, 200-250 - 4 units, 251-299 - 6 units,  300-349 - 8 units,  350 or above 10 units. Insulin PEN if approved, provide syringes and needles if needed. 10 mL 0 06/25/2016 at Unknown time  . insulin NPH-regular Human (NOVOLIN 70/30) (70-30) 100 UNIT/ML injection Inject 22 Units into the skin 2 (two) times daily with a meal. (Patient taking differently: Inject 30 Units into the skin 2 (two) times daily with a meal. ) 10 mL 0 06/25/2016 at Unknown time  . levothyroxine (SYNTHROID, LEVOTHROID) 137 MCG tablet Take 1 tablet (137 mcg total) by mouth daily. BRAND NAME ONLY 30 tablet 1 06/25/2016 at Unknown time  . mycophenolate (CELLCEPT) 250 MG capsule Take 500 mg by mouth 2 (two) times daily.    06/25/2016 at 2000  . predniSONE (DELTASONE) 5 MG tablet Take 5 mg by mouth daily.  6 06/25/2016 at Unknown time  . ranitidine (ZANTAC) 150 MG tablet Take 150 mg by mouth daily.   06/25/2016 at Unknown time  . tacrolimus (PROGRAF) 0.5 MG capsule Take 0.5 mg by mouth every morning.    06/25/2016 at Unknown time  . tacrolimus (PROGRAF) 1 MG capsule Take 1 mg by mouth at  bedtime.   06/25/2016 at 2000  . levothyroxine (SYNTHROID, LEVOTHROID) 137 MCG tablet Take by mouth.   Not Taking at Unknown time  . SSD 1 % cream Apply 1 application topically daily.    unknown  . tacrolimus (PROGRAF) 0.5 MG capsule TAKE 2 CAPSULES BY MOUTH EVERY MORNING AND TAKE 1 CAPSULE BY MOUTH EACH EVENING   Not Taking at Unknown time    Assessment/Plan: 1 Renal Transplant function improving with better bps.  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 Can go back to usual dose Pred after today. 2 DM ^ glu with Pred, will get better with lower dose 3 HTN cont meds 4 Obesity 5 Leg ulcer needs dopplers LE P will s/o for now and f/u outpatient with Dr. Abel Presto.  Stop ivf,     LOS: 3 days    Marceil Welp L 06/30/2016,10:17 AM

## 2016-06-30 NOTE — Consult Note (Signed)
   [facility]   06/30/2016  [name] Parodyamela S Mathies 1965-12-23 [id number]    [facility]. Went to bedside to offer and explain [facility]. [facility]. [facility] with contact information to call in future if changes mind. Will make inpatient RNCM aware that patient [facility].  Raiford NobleAtika Hall, MSN-Ed, RN,BSN [facility] Hospital Liaison [phone]

## 2016-06-30 NOTE — Progress Notes (Signed)
Patient given discharge instructions, and verbalized an understanding of all discharge instructions.  Patient agrees with discharge plan, and is being discharged in stable medical condition.  Patient was instructed on new wound care instructions, and how to use alginate dressing.  Patient comfortable with instructions, and has seen dressing changed numerous times.

## 2016-07-01 LAB — CULTURE, BLOOD (ROUTINE X 2): Culture: NO GROWTH

## 2016-07-01 LAB — FECAL LACTOFERRIN, QUANT: Lactoferrin, Fecal, Quant.: 55.7 ug/mL(g) — ABNORMAL HIGH (ref 0.00–7.24)

## 2016-07-02 LAB — STOOL CULTURE REFLEX - RSASHR

## 2016-07-02 LAB — STOOL CULTURE: E coli, Shiga toxin Assay: NEGATIVE

## 2016-07-02 LAB — STOOL CULTURE REFLEX - CMPCXR

## 2016-07-13 ENCOUNTER — Emergency Department (HOSPITAL_COMMUNITY)
Admission: EM | Admit: 2016-07-13 | Discharge: 2016-07-13 | Disposition: A | Discharge status: Home / Self Care | Payer: Medicare Other | Attending: Emergency Medicine | Admitting: Emergency Medicine

## 2016-07-13 ENCOUNTER — Encounter (HOSPITAL_COMMUNITY): Payer: Self-pay | Admitting: Emergency Medicine

## 2016-07-13 DIAGNOSIS — Z7982 Long term (current) use of aspirin: Secondary | ICD-10-CM | POA: Insufficient documentation

## 2016-07-13 DIAGNOSIS — Z5321 Procedure and treatment not carried out due to patient leaving prior to being seen by health care provider: Secondary | ICD-10-CM | POA: Insufficient documentation

## 2016-07-13 DIAGNOSIS — E1122 Type 2 diabetes mellitus with diabetic chronic kidney disease: Secondary | ICD-10-CM | POA: Insufficient documentation

## 2016-07-13 DIAGNOSIS — N186 End stage renal disease: Secondary | ICD-10-CM | POA: Insufficient documentation

## 2016-07-13 DIAGNOSIS — Z794 Long term (current) use of insulin: Secondary | ICD-10-CM | POA: Insufficient documentation

## 2016-07-13 DIAGNOSIS — I120 Hypertensive chronic kidney disease with stage 5 chronic kidney disease or end stage renal disease: Secondary | ICD-10-CM | POA: Diagnosis not present

## 2016-07-13 DIAGNOSIS — Z87891 Personal history of nicotine dependence: Secondary | ICD-10-CM | POA: Diagnosis not present

## 2016-07-13 DIAGNOSIS — L089 Local infection of the skin and subcutaneous tissue, unspecified: Secondary | ICD-10-CM | POA: Diagnosis present

## 2016-07-13 DIAGNOSIS — E039 Hypothyroidism, unspecified: Secondary | ICD-10-CM | POA: Diagnosis not present

## 2016-07-13 LAB — CBC
HCT: 35.5 % — ABNORMAL LOW (ref 36.0–46.0)
Hemoglobin: 11.4 g/dL — ABNORMAL LOW (ref 12.0–15.0)
MCH: 28 pg (ref 26.0–34.0)
MCHC: 32.1 g/dL (ref 30.0–36.0)
MCV: 87.2 fL (ref 78.0–100.0)
Platelets: 202 10*3/uL (ref 150–400)
RBC: 4.07 MIL/uL (ref 3.87–5.11)
RDW: 14.4 % (ref 11.5–15.5)
WBC: 8.2 10*3/uL (ref 4.0–10.5)

## 2016-07-13 LAB — BASIC METABOLIC PANEL
Anion gap: 5 (ref 5–15)
BUN: 24 mg/dL — AB (ref 6–20)
CO2: 26 mmol/L (ref 22–32)
CREATININE: 1.14 mg/dL — AB (ref 0.44–1.00)
Calcium: 9.7 mg/dL (ref 8.9–10.3)
Chloride: 107 mmol/L (ref 101–111)
GFR calc Af Amer: >60 mL/min (ref >60)
GFR, EST NON AFRICAN AMERICAN: 56 mL/min — AB (ref >60)
GLUCOSE: 165 mg/dL — AB (ref 65–99)
Potassium: 5.5 mmol/L — ABNORMAL HIGH (ref 3.5–5.1)
SODIUM: 138 mmol/L (ref 135–145)

## 2016-07-13 LAB — CBG MONITORING, ED: GLUCOSE-CAPILLARY: 150 mg/dL — AB (ref 65–99)

## 2016-07-13 NOTE — ED Notes (Signed)
Pt called from triage, no answer and pt was seen leaving ED

## 2016-07-13 NOTE — ED Notes (Signed)
Called pt for reasses vitals no response. 

## 2016-07-13 NOTE — ED Triage Notes (Addendum)
Pt with hx of diabetes c/o unhealing wound to R calf. Pt sts she has struggled with it for awhile and was hospitalized for it a month ago. Pt sts it almost healed at that point and since then has gotten worse. Pt C/o 9/10 pain. Denies N/V/D/ fever. Pt also c/o headache x a few days. A&Ox4 and ambulatory. Pt last checked her sugar last night and it was 268. Pt sts she hasn't taken her "sugar medicine or checked her sugars today" since she was laying in bed all day.

## 2016-07-14 ENCOUNTER — Emergency Department (HOSPITAL_COMMUNITY)
Admission: EM | Admit: 2016-07-14 | Discharge: 2016-07-14 | Disposition: A | Discharge status: Home / Self Care | Payer: Medicare Other | Attending: Emergency Medicine | Admitting: Emergency Medicine

## 2016-07-14 ENCOUNTER — Encounter (HOSPITAL_COMMUNITY): Payer: Self-pay | Admitting: *Deleted

## 2016-07-14 DIAGNOSIS — E039 Hypothyroidism, unspecified: Secondary | ICD-10-CM | POA: Insufficient documentation

## 2016-07-14 DIAGNOSIS — Z940 Kidney transplant status: Secondary | ICD-10-CM | POA: Insufficient documentation

## 2016-07-14 DIAGNOSIS — Z794 Long term (current) use of insulin: Secondary | ICD-10-CM | POA: Diagnosis not present

## 2016-07-14 DIAGNOSIS — E1122 Type 2 diabetes mellitus with diabetic chronic kidney disease: Secondary | ICD-10-CM | POA: Insufficient documentation

## 2016-07-14 DIAGNOSIS — L97911 Non-pressure chronic ulcer of unspecified part of right lower leg limited to breakdown of skin: Secondary | ICD-10-CM | POA: Diagnosis not present

## 2016-07-14 DIAGNOSIS — I120 Hypertensive chronic kidney disease with stage 5 chronic kidney disease or end stage renal disease: Secondary | ICD-10-CM | POA: Diagnosis not present

## 2016-07-14 DIAGNOSIS — L97919 Non-pressure chronic ulcer of unspecified part of right lower leg with unspecified severity: Secondary | ICD-10-CM | POA: Diagnosis present

## 2016-07-14 DIAGNOSIS — Z87891 Personal history of nicotine dependence: Secondary | ICD-10-CM | POA: Insufficient documentation

## 2016-07-14 DIAGNOSIS — Z79899 Other long term (current) drug therapy: Secondary | ICD-10-CM | POA: Diagnosis not present

## 2016-07-14 DIAGNOSIS — N186 End stage renal disease: Secondary | ICD-10-CM | POA: Insufficient documentation

## 2016-07-14 DIAGNOSIS — Z7982 Long term (current) use of aspirin: Secondary | ICD-10-CM | POA: Insufficient documentation

## 2016-07-14 MED ORDER — DOXYCYCLINE HYCLATE 100 MG PO CAPS: 100.0000 mg | ORAL_CAPSULE | Freq: Two times a day (BID) | ORAL | 0 refills | Status: DC

## 2016-07-14 MED ORDER — DOXYCYCLINE HYCLATE 100 MG PO TABS
100.0000 mg | ORAL_TABLET | Freq: Once | ORAL | Status: AC
  Administered 2016-07-14: 100 mg via ORAL
  Filled 2016-07-14: qty 1

## 2016-07-14 NOTE — ED Notes (Signed)
Discharge instructions, follow up care, and rx x1 reviewed with patient. Patient verbalized understanding. 

## 2016-07-14 NOTE — Discharge Instructions (Signed)
Keep wound clean with daily washings. Antibiotic twice a day. You'll need follow-up at the wound center. They apparently called your number and left a message, but if this is not true, I have given you information for the clinic. Simple dressing.

## 2016-07-14 NOTE — ED Provider Notes (Signed)
[facility]-EMERGENCY DEPT Provider Note   CSN: [id number] Arrival date & time: 07/14/16  1233     History   Chief Complaint No chief complaint on file.   HPI [name] is a 50 y.o. female.  Ulcer on right lower extremity for several weeks. She was hospitalized for this ulcer on 06/26/16. No fever, sweats, chills. She was seen by her primary care doctor and referred to the wound care center.  The patient has not followed up on this. She has end-stage renal disease and is status post kidney transplant. Severity of symptoms is mild to moderate.      Past Medical History:  Diagnosis Date  . Arthritis    "elbows, knees, legs, back" (09/24/2015)  . CKD (chronic kidney disease)   . Daily headache   . Depression    "years ago"  . End stage renal disease (HCC)    right arm AV graft, post transplant  . Hypertension   . Hypothyroid   . 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 Kidney disease   . Pneumonia ~ 2007?  [name]. Type II diabetes mellitus (HCC)    Insulin dependant    Patient Active Problem List   Diagnosis Date Noted  . Hypoglycemia   . Wound cellulitis   . CKD (chronic kidney disease) stage 3, GFR 30-59 ml/min 06/26/2016  . Cellulitis 06/26/2016  . Cellulitis of right lower extremity   . Hyperglycemia   . QT prolongation 05/13/2016  . UTI (lower urinary tract infection)   . Hypomagnesemia   . Pressure ulcer 04/11/2016  . Acute encephalopathy 04/11/2016  . Altered mental state 04/11/2016  . Altered mental status 04/10/2016  . Insulin dependent diabetes mellitus (HCC) 04/10/2016  . DKA (diabetic ketoacidoses) (HCC) 11/02/2015  . Hyperglycemia without ketosis 09/24/2015  . CKD (chronic kidney disease) stage 2, GFR 60-89 ml/min 09/24/2015  . History of renal transplant 09/24/2015  . Uncontrolled type 1 diabetes mellitus with foot ulcer (HCC) 06/04/2015  . History of renal transplantation 06/04/2015  . Foot ulcer, right (HCC)   . Foot abscess,  right   . Diarrhea 06/02/2015  . DM (diabetes mellitus) type 2, uncontrolled, with ketoacidosis (HCC) 06/02/2015  . Type 1 diabetes mellitus with diabetic foot ulcer (HCC) 06/02/2015  . Acute renal failure superimposed on stage 3 chronic kidney disease (HCC) 06/02/2015  . Diabetic foot infection (HCC) 06/01/2015  . Essential hypertension 06/01/2015  . GERD (gastroesophageal reflux disease) 06/01/2015  . End stage renal disease (HCC) 10/24/2012  . H/O kidney transplant 08/04/2012  . High anion gap metabolic acidosis 08/04/2012  . Sepsis (HCC) 08/04/2012  . DKA, type 1 (HCC) 09/22/2011  . Hyperkalemia 09/22/2011  . Immunosuppression (HCC)   . Hypothyroidism     Past Surgical History:  Procedure Laterality Date  . AMPUTATION Left 05/11/2014   Procedure: AMPUTATION LEFT GREAT TOE;  Surgeon: Kathryne Hitchhristopher Y Blackman, MD;  Location: [facility];  Service: Orthopedics;  Laterality: Left;  . AV FISTULA PLACEMENT Right    forearm  . BACK SURGERY    . CATARACT EXTRACTION W/ INTRAOCULAR LENS  IMPLANT, BILATERAL Bilateral   . CESAREAN SECTION  07/1999  . DG AV DIALYSIS GRAFT DECLOT OR    . INCISION AND DRAINAGE Right 06/02/2015   Procedure: INCISION AND DRAINAGE OF RIGHT 3rd RAY RESECTION;  Surgeon: Kathryne Hitchhristopher Y Blackman, MD;  Location: [facility] OR;  Service: Orthopedics;  Laterality: Right;  . 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 LUMBAR DISC SURGERY  2001  . NEPHRECTOMY TRANSPLANTED ORGAN    . TOE AMPUTATION Right    1,2 & 3rd toes.  . TUBAL LIGATION  07/1999    OB History    No data available       Home Medications    Prior to Admission medications   Medication Sig Start Date End Date Taking? Authorizing Provider  acetaminophen (TYLENOL) 325 MG tablet Take 2 tablets (650 mg total) by mouth every 6 (six) hours as needed for mild pain, moderate pain, fever or headache. 05/15/16   Elease Etienne, MD  amLODipine (NORVASC) 5 MG tablet Take 5 mg by mouth daily.    Historical Provider, MD    aspirin EC 81 MG tablet Take 81 mg by mouth daily.    Historical Provider, MD  carvedilol (COREG) 6.25 MG tablet Take 1 tablet (6.25 mg total) by mouth 2 (two) times daily. 06/30/16   Rodolph Bong, MD  cholestyramine Lanetta Inch) 4 g packet Take 1 packet (4 g total) by mouth 2 (two) times daily. Take for 10 days then stop. 06/30/16 07/10/16  Rodolph Bong, MD  doxycycline (VIBRAMYCIN) 100 MG capsule Take 1 capsule (100 mg total) by mouth 2 (two) times daily. 07/14/16   Donnetta Hutching, MD  HYDROcodone-acetaminophen (NORCO/VICODIN) 5-325 MG tablet Take 1-2 tablets by mouth every 6 (six) hours as needed for severe pain or moderate pain. 06/30/16   Rodolph Bong, MD  insulin aspart (NOVOLOG) 100 UNIT/ML injection Before each meal 3 times a day, 140-199 - 2 units, 200-250 - 4 units, 251-299 - 6 units,  300-349 - 8 units,  350 or above 10 units. Insulin PEN if approved, provide syringes and needles if needed. 04/18/16   Albertine Grates, MD  insulin NPH-regular Human (NOVOLIN 70/30) (70-30) 100 UNIT/ML injection Inject 30 Units into the skin 2 (two) times daily with a meal. 06/30/16   Rodolph Bong, MD  levothyroxine (SYNTHROID, LEVOTHROID) 137 MCG tablet Take 1 tablet (137 mcg total) by mouth daily. BRAND NAME ONLY 03/04/15   Alison Murray, MD  mycophenolate (CELLCEPT) 250 MG capsule Take 500 mg by mouth 2 (two) times daily.     Historical Provider, MD  predniSONE (DELTASONE) 5 MG tablet Take 5 mg by mouth daily. 05/29/15   Historical Provider, MD  ranitidine (ZANTAC) 150 MG tablet Take 150 mg by mouth daily. 03/27/16   Historical Provider, MD  SSD 1 % cream Apply 1 application topically daily.  06/10/16   Historical Provider, MD  tacrolimus (PROGRAF) 0.5 MG capsule Take 0.5 mg by mouth every morning.     Historical Provider, MD  tacrolimus (PROGRAF) 1 MG capsule Take 1 mg by mouth at bedtime.    Historical Provider, MD    Family History Family History  Problem Relation Age of Onset  . Emphysema Mother    . Throat cancer Mother   . COPD Mother   . Cancer Mother   . Emphysema Father   . COPD Father   . Stroke Father   . ADD / ADHD Son     Social History Social History  Substance Use Topics  . Smoking status: Former Smoker    Packs/day: 1.00    Years: 11.00    Types: Cigarettes    Quit date: 09/21/1998  . Smokeless tobacco: Never Used  . Alcohol use No     Allergies   Ace inhibitors; Adhesive [tape]; and Lisinopril   Review of Systems Review of Systems  All other systems  reviewed and are negative.    Physical Exam Updated Vital Signs BP 153/96 (BP Location: Left Arm)   Pulse 83   Temp 98.2 F (36.8 C) (Oral)   Resp 18   LMP 11/19/2011   SpO2 97%   Physical Exam  Constitutional: She is oriented to person, place, and time. She appears well-developed and well-nourished.  HENT:  Head: Normocephalic and atraumatic.  Eyes: Conjunctivae are normal.  Neck: Neck supple.  Cardiovascular: Normal rate and regular rhythm.   Pulmonary/Chest: Effort normal and breath sounds normal.  Abdominal: Soft. Bowel sounds are normal.  Musculoskeletal: Normal range of motion.  Neurological: She is alert and oriented to person, place, and time.  Skin:  Right lower extremity: 2.5 cm ulcer with a 7 x 5 cm surrounding erythema. Minimal tenderness. No pus.  Psychiatric: She has a normal mood and affect. Her behavior is normal.  Nursing note and vitals reviewed.    ED Treatments / Results  Labs (all labs ordered are listed, but only abnormal results are displayed) Labs Reviewed - No data to display  EKG  EKG Interpretation None       Radiology No results found.  Procedures Procedures (including critical care time)  Medications Ordered in ED Medications  doxycycline (VIBRA-TABS) tablet 100 mg (100 mg Oral Given 07/14/16 1607)     Initial Impression / Assessment and Plan / ED Course  I have reviewed the triage vital signs and the nursing notes.  Pertinent labs &  imaging results that were available during my care of the patient were reviewed by me and considered in my medical decision making (see chart for details).  Clinical Course     Past medical history was reviewed in detail. It was my opinion that patient, initially treated as an outpatient for this ulcer. Will Rx doxycycline 100 mg twice a day. Referral to wound care center  Final Clinical Impressions(s) / ED Diagnoses   Final diagnoses:  Ulcer of right lower extremity, limited to breakdown of skin (HCC)    New Prescriptions New Prescriptions   DOXYCYCLINE (VIBRAMYCIN) 100 MG CAPSULE    Take 1 capsule (100 mg total) by mouth 2 (two) times daily.     Donnetta HutchingBrian Shabnam Ladd, MD 07/14/16 [phone]

## 2016-07-14 NOTE — ED Triage Notes (Signed)
Pt w/ hx of diabetes complains of 3x3.5cm ulcer to right posterior lower leg for the past month. Pt states it healed initially but opened up again. Pt has redness and pain to surrounding tissue. Pt also complains of headache.

## 2016-07-17 ENCOUNTER — Other Ambulatory Visit: Payer: Self-pay

## 2016-07-17 NOTE — Patient Outreach (Signed)
[facility] Care Management  07/17/2016  Exie Parodyamela S Howes 15-Jan-1966 [id number]  Referral date: 07/15/16  Referral Source: ED census referral Referral reason: 4 or more ED visits in the past 6 months.   Telephone call to patient regarding ED referral. Unable to reach patient. HIPAA compliant voice message left with call back phone number.   PLAN;  RNCM will attempt 2nd telephone call to patient within 1 week.   George InaDavina Daniqua Campoy RN,BSN,CCM [facility] Telephonic  [phone]

## 2016-07-21 ENCOUNTER — Inpatient Hospital Stay (HOSPITAL_COMMUNITY)
Admission: EM | Admit: 2016-07-21 | Discharge: 2016-07-23 | DRG: 638 | Disposition: A | Discharge status: Home / Self Care | Payer: Medicare Other | Attending: Family Medicine | Admitting: Family Medicine

## 2016-07-21 ENCOUNTER — Encounter (HOSPITAL_COMMUNITY): Payer: Self-pay

## 2016-07-21 DIAGNOSIS — Z794 Long term (current) use of insulin: Secondary | ICD-10-CM | POA: Diagnosis not present

## 2016-07-21 DIAGNOSIS — Z961 Presence of intraocular lens: Secondary | ICD-10-CM | POA: Diagnosis present

## 2016-07-21 DIAGNOSIS — E1122 Type 2 diabetes mellitus with diabetic chronic kidney disease: Secondary | ICD-10-CM | POA: Diagnosis present

## 2016-07-21 DIAGNOSIS — E1310 Other specified diabetes mellitus with ketoacidosis without coma: Secondary | ICD-10-CM

## 2016-07-21 DIAGNOSIS — Z87891 Personal history of nicotine dependence: Secondary | ICD-10-CM | POA: Diagnosis not present

## 2016-07-21 DIAGNOSIS — Z91048 Other nonmedicinal substance allergy status: Secondary | ICD-10-CM

## 2016-07-21 DIAGNOSIS — Z888 Allergy status to other drugs, medicaments and biological substances status: Secondary | ICD-10-CM | POA: Diagnosis not present

## 2016-07-21 DIAGNOSIS — Z823 Family history of stroke: Secondary | ICD-10-CM | POA: Diagnosis not present

## 2016-07-21 DIAGNOSIS — I248 Other forms of acute ischemic heart disease: Secondary | ICD-10-CM | POA: Diagnosis present

## 2016-07-21 DIAGNOSIS — Z9841 Cataract extraction status, right eye: Secondary | ICD-10-CM

## 2016-07-21 DIAGNOSIS — Z825 Family history of asthma and other chronic lower respiratory diseases: Secondary | ICD-10-CM

## 2016-07-21 DIAGNOSIS — I129 Hypertensive chronic kidney disease with stage 1 through stage 4 chronic kidney disease, or unspecified chronic kidney disease: Secondary | ICD-10-CM | POA: Diagnosis present

## 2016-07-21 DIAGNOSIS — R739 Hyperglycemia, unspecified: Secondary | ICD-10-CM | POA: Diagnosis not present

## 2016-07-21 DIAGNOSIS — Z7982 Long term (current) use of aspirin: Secondary | ICD-10-CM | POA: Diagnosis not present

## 2016-07-21 DIAGNOSIS — Z808 Family history of malignant neoplasm of other organs or systems: Secondary | ICD-10-CM | POA: Diagnosis not present

## 2016-07-21 DIAGNOSIS — E1159 Type 2 diabetes mellitus with other circulatory complications: Secondary | ICD-10-CM | POA: Diagnosis not present

## 2016-07-21 DIAGNOSIS — N183 Chronic kidney disease, stage 3 (moderate): Secondary | ICD-10-CM | POA: Diagnosis present

## 2016-07-21 DIAGNOSIS — Z9842 Cataract extraction status, left eye: Secondary | ICD-10-CM | POA: Diagnosis not present

## 2016-07-21 DIAGNOSIS — Z7952 Long term (current) use of systemic steroids: Secondary | ICD-10-CM

## 2016-07-21 DIAGNOSIS — Z940 Kidney transplant status: Secondary | ICD-10-CM | POA: Diagnosis not present

## 2016-07-21 DIAGNOSIS — E1110 Type 2 diabetes mellitus with ketoacidosis without coma: Secondary | ICD-10-CM | POA: Diagnosis present

## 2016-07-21 DIAGNOSIS — L97519 Non-pressure chronic ulcer of other part of right foot with unspecified severity: Secondary | ICD-10-CM | POA: Diagnosis present

## 2016-07-21 DIAGNOSIS — E039 Hypothyroidism, unspecified: Secondary | ICD-10-CM | POA: Diagnosis present

## 2016-07-21 DIAGNOSIS — I10 Essential (primary) hypertension: Secondary | ICD-10-CM | POA: Diagnosis not present

## 2016-07-21 LAB — URINE MICROSCOPIC-ADD ON

## 2016-07-21 LAB — GLUCOSE, CAPILLARY: GLUCOSE-CAPILLARY: 527 mg/dL — AB (ref 65–99)

## 2016-07-21 LAB — URINALYSIS, ROUTINE W REFLEX MICROSCOPIC
BILIRUBIN URINE: NEGATIVE
Hgb urine dipstick: NEGATIVE
KETONES UR: 40 mg/dL — AB
LEUKOCYTES UA: NEGATIVE
Nitrite: NEGATIVE
PROTEIN: 100 mg/dL — AB
Specific Gravity, Urine: 1.024 (ref 1.005–1.030)
pH: 5.5 (ref 5.0–8.0)

## 2016-07-21 LAB — BASIC METABOLIC PANEL WITH GFR
Anion gap: 21 — ABNORMAL HIGH (ref 5–15)
BUN: 32 mg/dL — ABNORMAL HIGH (ref 6–20)
CO2: 14 mmol/L — ABNORMAL LOW (ref 22–32)
Calcium: 10 mg/dL (ref 8.9–10.3)
Chloride: 94 mmol/L — ABNORMAL LOW (ref 101–111)
Creatinine, Ser: 1.98 mg/dL — ABNORMAL HIGH (ref 0.44–1.00)
GFR calc Af Amer: 33 mL/min — ABNORMAL LOW
GFR calc non Af Amer: 28 mL/min — ABNORMAL LOW
Glucose, Bld: 676 mg/dL (ref 65–99)
Potassium: 4.7 mmol/L (ref 3.5–5.1)
Sodium: 129 mmol/L — ABNORMAL LOW (ref 135–145)

## 2016-07-21 LAB — CBC
HCT: 37.4 % (ref 36.0–46.0)
Hemoglobin: 11.6 g/dL — ABNORMAL LOW (ref 12.0–15.0)
MCH: 27.6 pg (ref 26.0–34.0)
MCHC: 31 g/dL (ref 30.0–36.0)
MCV: 89 fL (ref 78.0–100.0)
PLATELETS: 211 10*3/uL (ref 150–400)
RBC: 4.2 MIL/uL (ref 3.87–5.11)
RDW: 13.7 % (ref 11.5–15.5)
WBC: 7.4 10*3/uL (ref 4.0–10.5)

## 2016-07-21 LAB — I-STAT BETA HCG BLOOD, ED (NOT ORDERABLE): I-stat hCG, quantitative: 66.7 m[IU]/mL — ABNORMAL HIGH (ref <5)

## 2016-07-21 LAB — I-STAT VENOUS BLOOD GAS, ED
ACID-BASE DEFICIT: 15 mmol/L — AB (ref 0.0–2.0)
Bicarbonate: 11.6 mmol/L — ABNORMAL LOW (ref 20.0–28.0)
O2 SAT: 68 %
PO2 VEN: 42 mmHg (ref 32.0–45.0)
TCO2: 12 mmol/L (ref 0–100)
pCO2, Ven: 28.2 mmHg — ABNORMAL LOW (ref 44.0–60.0)
pH, Ven: 7.223 — ABNORMAL LOW (ref 7.250–7.430)

## 2016-07-21 MED ORDER — TACROLIMUS 0.5 MG PO CAPS
0.5000 mg | ORAL_CAPSULE | ORAL | Status: DC
  Administered 2016-07-22 – 2016-07-23 (×2): 0.5 mg via ORAL
  Filled 2016-07-21 (×2): qty 1

## 2016-07-21 MED ORDER — DEXTROSE-NACL 5-0.45 % IV SOLN: INTRAVENOUS | Status: DC

## 2016-07-21 MED ORDER — PREDNISONE 5 MG PO TABS
5.0000 mg | ORAL_TABLET | Freq: Every day | ORAL | Status: DC
  Administered 2016-07-22 – 2016-07-23 (×2): 5 mg via ORAL
  Filled 2016-07-21 (×2): qty 1

## 2016-07-21 MED ORDER — SODIUM CHLORIDE 0.9 % IV BOLUS (SEPSIS)
1000.0000 mL | Freq: Once | INTRAVENOUS | Status: AC
  Administered 2016-07-21: 1000 mL via INTRAVENOUS

## 2016-07-21 MED ORDER — HEPARIN SODIUM (PORCINE) 5000 UNIT/ML IJ SOLN
5000.0000 [IU] | Freq: Three times a day (TID) | INTRAMUSCULAR | Status: DC
  Administered 2016-07-22 – 2016-07-23 (×4): 5000 [IU] via SUBCUTANEOUS
  Filled 2016-07-21 (×5): qty 1

## 2016-07-21 MED ORDER — CARVEDILOL 6.25 MG PO TABS
6.2500 mg | ORAL_TABLET | Freq: Two times a day (BID) | ORAL | Status: DC
  Administered 2016-07-22 – 2016-07-23 (×4): 6.25 mg via ORAL
  Filled 2016-07-21 (×4): qty 1

## 2016-07-21 MED ORDER — DOXYCYCLINE HYCLATE 100 MG PO TABS
100.0000 mg | ORAL_TABLET | Freq: Two times a day (BID) | ORAL | Status: DC
  Administered 2016-07-22 – 2016-07-23 (×4): 100 mg via ORAL
  Filled 2016-07-21 (×4): qty 1

## 2016-07-21 MED ORDER — SODIUM CHLORIDE 0.9 % IV SOLN
INTRAVENOUS | Status: DC
  Administered 2016-07-21: via INTRAVENOUS

## 2016-07-21 MED ORDER — HYDROCODONE-ACETAMINOPHEN 5-325 MG PO TABS
1.0000 | ORAL_TABLET | Freq: Four times a day (QID) | ORAL | Status: DC | PRN
  Administered 2016-07-22: 2 via ORAL
  Administered 2016-07-22: 1 via ORAL
  Administered 2016-07-22 – 2016-07-23 (×2): 2 via ORAL
  Filled 2016-07-21: qty 1
  Filled 2016-07-21 (×3): qty 2

## 2016-07-21 MED ORDER — SODIUM CHLORIDE 0.9 % IV SOLN
INTRAVENOUS | Status: DC
  Administered 2016-07-21: 23:00:00 via INTRAVENOUS

## 2016-07-21 MED ORDER — DEXTROSE-NACL 5-0.45 % IV SOLN
INTRAVENOUS | Status: DC
  Administered 2016-07-22 (×2): via INTRAVENOUS

## 2016-07-21 MED ORDER — LEVOTHYROXINE SODIUM 25 MCG PO TABS
137.0000 ug | ORAL_TABLET | Freq: Every day | ORAL | Status: DC
  Administered 2016-07-22 – 2016-07-23 (×2): 137 ug via ORAL
  Filled 2016-07-21 (×2): qty 1

## 2016-07-21 MED ORDER — SODIUM CHLORIDE 0.9 % IV SOLN
INTRAVENOUS | Status: DC
  Administered 2016-07-21: 5.4 [IU]/h via INTRAVENOUS
  Filled 2016-07-21: qty 2.5

## 2016-07-21 MED ORDER — AMLODIPINE BESYLATE 5 MG PO TABS
5.0000 mg | ORAL_TABLET | Freq: Every day | ORAL | Status: DC
  Administered 2016-07-22 – 2016-07-23 (×2): 5 mg via ORAL
  Filled 2016-07-21 (×2): qty 1

## 2016-07-21 MED ORDER — FAMOTIDINE 20 MG PO TABS
20.0000 mg | ORAL_TABLET | ORAL | Status: DC
  Administered 2016-07-22 – 2016-07-23 (×2): 20 mg via ORAL
  Filled 2016-07-21 (×2): qty 1

## 2016-07-21 MED ORDER — MYCOPHENOLATE MOFETIL 250 MG PO CAPS
500.0000 mg | ORAL_CAPSULE | Freq: Two times a day (BID) | ORAL | Status: DC
  Administered 2016-07-22 – 2016-07-23 (×4): 500 mg via ORAL
  Filled 2016-07-21 (×5): qty 2

## 2016-07-21 MED ORDER — TACROLIMUS 1 MG PO CAPS
1.0000 mg | ORAL_CAPSULE | Freq: Every day | ORAL | Status: DC
  Administered 2016-07-22 (×2): 1 mg via ORAL
  Filled 2016-07-21 (×2): qty 1

## 2016-07-21 MED ORDER — ASPIRIN EC 81 MG PO TBEC
81.0000 mg | DELAYED_RELEASE_TABLET | Freq: Every day | ORAL | Status: DC
  Administered 2016-07-22 – 2016-07-23 (×2): 81 mg via ORAL
  Filled 2016-07-21 (×2): qty 1

## 2016-07-21 MED ORDER — INSULIN REGULAR HUMAN 100 UNIT/ML IJ SOLN
INTRAMUSCULAR | Status: DC
  Administered 2016-07-22: 1.2 [IU]/h via INTRAVENOUS
  Administered 2016-07-22: 1.9 [IU]/h via INTRAVENOUS
  Administered 2016-07-22: 1.8 [IU]/h via INTRAVENOUS
  Administered 2016-07-22: 5.4 [IU]/h via INTRAVENOUS
  Filled 2016-07-21: qty 2.5

## 2016-07-21 NOTE — ED Triage Notes (Signed)
Onset today BS has been reading "HI'.  Pt also has wound on lateral side of right lower leg.  C/o headache x 2 days, sleeping more.

## 2016-07-21 NOTE — H&P (Signed)
History and Physical    [name] Parodyamela S Tashiro [id number] DOB: Jan 20, 1966 DOA: 07/21/2016  PCP: Dois DavenportICHTER,KAREN L., MD  Patient coming from: Home.  Chief Complaint: Elevated blood sugar.  HPI: [name] is a 50 y.o. female with history of renal transplant on antirejection medication, diabetes mellitus type 2, hypertension was brought to the ER after patient had elevated blood sugar. Patient states she has been compliant with her medications. Patient denies any chest pain or shortness of breath nausea vomiting or diarrhea. In the ER patient's blood sugar was found to be in the 600s with an anion gap of 21. Patient is being admitted for diabetic ketoacidosis. Patient has chronic wound of the right lower extremity with no active discharge.  ED Course: Patient was started on insulin infusion and fluid bolus for diabetic ketoacidosis.  Review of Systems: As per HPI, rest all negative.   Past Medical History:  Diagnosis Date  . Arthritis    "elbows, knees, legs, back" (09/24/2015)  . CKD (chronic kidney disease)   . Daily headache   . Depression    "years ago"  . End stage renal disease (HCC)    right arm AV graft, post transplant  . Hypertension   . Hypothyroid   . 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 Kidney disease   . Pneumonia ~ 2007?  [name]. Type II diabetes mellitus (HCC)    Insulin dependant    Past Surgical History:  Procedure Laterality Date  . AMPUTATION Left 05/11/2014   Procedure: AMPUTATION LEFT GREAT TOE;  Surgeon: Kathryne Hitchhristopher Y Blackman, MD;  Location: [facility] OR[facility];  Service: Orthopedics;  Laterality: Left;  . AV FISTULA PLACEMENT Right    forearm  . BACK SURGERY    . CATARACT EXTRACTION W/ INTRAOCULAR LENS  IMPLANT, BILATERAL Bilateral   . CESAREAN SECTION  07/1999  . DG AV DIALYSIS GRAFT DECLOT OR    . INCISION AND DRAINAGE Right 06/02/2015   Procedure: INCISION AND DRAINAGE OF RIGHT 3rd RAY RESECTION;  Surgeon: Kathryne Hitchhristopher Y Blackman, MD;   Location: [facility] OR;  Service: Orthopedics;  Laterality: Right;  . 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 LUMBAR DISC SURGERY  2001  . NEPHRECTOMY TRANSPLANTED ORGAN    . TOE AMPUTATION Right    1,2 & 3rd toes.  . TUBAL LIGATION  07/1999     reports that she quit smoking about 17 years ago. Her smoking use included Cigarettes. She has a 11.00 pack-year smoking history. She has never used smokeless tobacco. She reports that she does not drink alcohol or use drugs.  Allergies  Allergen Reactions  . Ace Inhibitors Cough  . Adhesive [Tape] Itching and Other (See Comments)    Please use paper tape  . Lisinopril Cough    Family History  Problem Relation Age of Onset  . Emphysema Mother   . Throat cancer Mother   . COPD Mother   . Cancer Mother   . Emphysema Father   . COPD Father   . Stroke Father   . ADD / ADHD Son     Prior to Admission medications   Medication Sig Start Date End Date Taking? Authorizing Provider  acetaminophen (TYLENOL) 325 MG tablet Take 2 tablets (650 mg total) by mouth every 6 (six) hours as needed for mild pain, moderate pain, fever or headache. 05/15/16  Yes Elease EtienneAnand D Hongalgi, MD  amLODipine (NORVASC) 5 MG tablet Take 5 mg by mouth daily.   Yes Historical Provider,  MD  aspirin EC 81 MG tablet Take 81 mg by mouth daily.   Yes Historical Provider, MD  carvedilol (COREG) 6.25 MG tablet Take 1 tablet (6.25 mg total) by mouth 2 (two) times daily. 06/30/16  Yes Rodolph Bonganiel V Thompson, MD  doxycycline (VIBRAMYCIN) 100 MG capsule Take 1 capsule (100 mg total) by mouth 2 (two) times daily. 07/14/16  Yes Donnetta HutchingBrian Cook, MD  HYDROcodone-acetaminophen (NORCO/VICODIN) 5-325 MG tablet Take 1-2 tablets by mouth every 6 (six) hours as needed for severe pain or moderate pain. 06/30/16  Yes Rodolph Bonganiel V Thompson, MD  insulin aspart (NOVOLOG) 100 UNIT/ML injection Before each meal 3 times a day, 140-199 - 2 units, 200-250 - 4 units, 251-299 - 6 units,  300-349 - 8 units,  350 or above 10  units. Insulin PEN if approved, provide syringes and needles if needed. 04/18/16  Yes Albertine GratesFang Xu, MD  insulin NPH-regular Human (NOVOLIN 70/30) (70-30) 100 UNIT/ML injection Inject 30 Units into the skin 2 (two) times daily with a meal. 06/30/16  Yes Rodolph Bonganiel V Thompson, MD  levothyroxine (SYNTHROID, LEVOTHROID) 137 MCG tablet Take 1 tablet (137 mcg total) by mouth daily. BRAND NAME ONLY 03/04/15  Yes Alison MurrayAlma M Devine, MD  mycophenolate (CELLCEPT) 250 MG capsule Take 500 mg by mouth 2 (two) times daily.    Yes Historical Provider, MD  predniSONE (DELTASONE) 5 MG tablet Take 5 mg by mouth daily. 05/29/15  Yes Historical Provider, MD  ranitidine (ZANTAC) 150 MG tablet Take 150 mg by mouth daily. 03/27/16  Yes Historical Provider, MD  SSD 1 % cream Apply 1 application topically daily.  06/10/16  Yes Historical Provider, MD  tacrolimus (PROGRAF) 0.5 MG capsule Take 0.5 mg by mouth every morning.    Yes Historical Provider, MD  tacrolimus (PROGRAF) 1 MG capsule Take 1 mg by mouth at bedtime.   Yes Historical Provider, MD  cholestyramine (QUESTRAN) 4 g packet Take 1 packet (4 g total) by mouth 2 (two) times daily. Take for 10 days then stop. 06/30/16 07/10/16  Rodolph Bonganiel V Thompson, MD    Physical Exam: Vitals:   07/21/16 2030 07/21/16 2045 07/21/16 2104 07/21/16 2150  BP: 106/58 106/56 (!) 97/54 (!) 117/53  Pulse: 91 88 91 92  Resp:    19  Temp:      TempSrc:      SpO2: 97% 97% 96% 100%  Weight:    89.4 kg (197 lb 1.5 oz)  Height:    5\' 7"  (1.702 m)      Constitutional: Moderately built and nourished. Vitals:   07/21/16 2030 07/21/16 2045 07/21/16 2104 07/21/16 2150  BP: 106/58 106/56 (!) 97/54 (!) 117/53  Pulse: 91 88 91 92  Resp:    19  Temp:      TempSrc:      SpO2: 97% 97% 96% 100%  Weight:    89.4 kg (197 lb 1.5 oz)  Height:    5\' 7"  (1.702 m)   Eyes: Anicteric. No pallor. ENMT: No discharge from the ears eyes nose and mouth. Neck: No mass felt. No neck rigidity. Respiratory: No rhonchi or  crepitations. Cardiovascular: S1 and S2 heard. Abdomen: Soft nontender bowel sounds present. Musculoskeletal: Wound on the right lower extremity. Skin: Wound of the right lower extremity. Neurologic: Alert awake oriented to time place and person. Moves all extremities. Psychiatric: Appears normal. Normal affect.   Labs on Admission: I have personally reviewed following labs and imaging studies  CBC:  Recent Labs Lab 07/21/16 1754  WBC 7.4  HGB 11.6*  HCT 37.4  MCV 89.0  PLT 211   Basic Metabolic Panel:  Recent Labs Lab 07/21/16 1754  NA 129*  K 4.7  CL 94*  CO2 14*  GLUCOSE 676*  BUN 32*  CREATININE 1.98*  CALCIUM 10.0   GFR: Estimated Creatinine Clearance: 39.4 mL/min (by C-G formula based on SCr of 1.98 mg/dL (H)). Liver Function Tests: No results for input(s): AST, ALT, ALKPHOS, BILITOT, PROT, ALBUMIN in the last 168 hours. No results for input(s): LIPASE, AMYLASE in the last 168 hours. No results for input(s): AMMONIA in the last 168 hours. Coagulation Profile: No results for input(s): INR, PROTIME in the last 168 hours. Cardiac Enzymes: No results for input(s): CKTOTAL, CKMB, CKMBINDEX, TROPONINI in the last 168 hours. BNP (last 3 results) No results for input(s): PROBNP in the last 8760 hours. HbA1C: No results for input(s): HGBA1C in the last 72 hours. CBG:  Recent Labs Lab 07/21/16 2249  GLUCAP >600*   Lipid Profile: No results for input(s): CHOL, HDL, LDLCALC, TRIG, CHOLHDL, LDLDIRECT in the last 72 hours. Thyroid Function Tests: No results for input(s): TSH, T4TOTAL, FREET4, T3FREE, THYROIDAB in the last 72 hours. Anemia Panel: No results for input(s): VITAMINB12, FOLATE, FERRITIN, TIBC, IRON, RETICCTPCT in the last 72 hours. Urine analysis:    Component Value Date/Time   COLORURINE YELLOW 07/21/2016 1905   APPEARANCEUR CLEAR 07/21/2016 1905   LABSPEC 1.024 07/21/2016 1905   PHURINE 5.5 07/21/2016 1905   GLUCOSEU >1000 (A) 07/21/2016  1905   HGBUR NEGATIVE 07/21/2016 1905   BILIRUBINUR NEGATIVE 07/21/2016 1905   KETONESUR 40 (A) 07/21/2016 1905   PROTEINUR 100 (A) 07/21/2016 1905   UROBILINOGEN 0.2 03/02/2015 1153   NITRITE NEGATIVE 07/21/2016 1905   LEUKOCYTESUR NEGATIVE 07/21/2016 1905   Sepsis Labs: @LABRCNTIP (procalcitonin:4,lacticidven:4) )No results found for this or any previous visit (from the past 240 hour(s)).   Radiological Exams on Admission: No results found.   Assessment/Plan Principal Problem:   Diabetic ketoacidosis without coma associated with type 2 diabetes mellitus (HCC) Active Problems:   Hypothyroidism   Essential hypertension   Foot ulcer, right (HCC)   History of renal transplantation   DKA, type 2 (HCC)    1. Diabetic ketoacidosis - precipitating cause not clear. Patient is placed on insulin infusion and fluids. Change to subcutaneous long-acting insulin once anion gap gets corrected. Follow metabolic panel. Will check troponin. 2. Hypertension on amlodipine and Coreg which will be continued. 3. Chronic kidney disease stage III with renal transplant - we'll continue tacrolimus, CellCept and prednisone. 4. Hypothyroidism on Synthroid. 5. Chronic wound of the right lower extremity does not look infected at this time. Closely observe.  EKG is pending.   Quantitative Beta-hCG is positive. We will recheck in a.m. to see this any rise in levels and also check a urine pregnancy screen.  DVT prophylaxis: Heparin. Code Status: Full code.  Family Communication: Discussed with patient.  Disposition Plan: Home.  Consults called: None.  Admission status: Inpatient.    Eduard Clos MD [facility] Pager [phone].  If 7PM-7AM, please contact night-coverage [email or web address] Password [email or web address]  07/21/2016, 11:45 PM

## 2016-07-21 NOTE — ED Notes (Signed)
POCT CBG resulted HIGH; Albin Fellingarla, RN aware

## 2016-07-21 NOTE — ED Provider Notes (Signed)
[facility]-EMERGENCY DEPT Provider Note   CSN: [id number] Arrival date & time: 07/21/16  1727     History   Chief Complaint Chief Complaint  Patient presents with  . Hyperglycemia    HPI [name] is a 50 y.o. female.  The history is provided by the patient and medical records. No language interpreter was used.  Hyperglycemia  Associated symptoms: fatigue   Associated symptoms: no abdominal pain, no chest pain, no dizziness, no dysuria, no fever, no nausea, no shortness of breath, no vomiting and no weakness    [name] is a 49 y.o. female  with a PMH of ESRD s/p transplant in 2011, insulin dependent diabetes who presents to the Emergency Department for hyperglycemia. Patient states she checked her blood sugar this morning and it read "high". She endorses associated fatigue and generalized headache. She typically takes 30 units of Humulin  twice a day and endorses compliance with this medication regimen. Prior to today, the last time she checked her blood sugar was the day before yesterday and notes it was in the 400s. She was seen in ED one week ago and treated for skin infection to lower leg with doxycyline. Patient has taken medication as directed and until completion and believes area is healing well. She denies abdominal pain, chest pain, shortness of breath, fevers, urinary frequency/urgency, dysuria.   Past Medical History:  Diagnosis Date  . Arthritis    "elbows, knees, legs, back" (09/24/2015)  . CKD (chronic kidney disease)   . Daily headache   . Depression    "years ago"  . End stage renal disease (HCC)    right arm AV graft, post transplant  . Hypertension   . Hypothyroid   . 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 Kidney disease   . Pneumonia ~ 2007?  [name] Type II diabetes mellitus (HCC)    Insulin dependant    Patient Active Problem List   Diagnosis Date Noted  . Hypoglycemia   . Wound cellulitis   . CKD (chronic kidney  disease) stage 3, GFR 30-59 ml/min 06/26/2016  . Cellulitis 06/26/2016  . Cellulitis of right lower extremity   . Hyperglycemia   . QT prolongation 05/13/2016  . UTI (lower urinary tract infection)   . Hypomagnesemia   . Pressure ulcer 04/11/2016  . Acute encephalopathy 04/11/2016  . Altered mental state 04/11/2016  . Altered mental status 04/10/2016  . Insulin dependent diabetes mellitus (HCC) 04/10/2016  . DKA (diabetic ketoacidoses) (HCC) 11/02/2015  . Hyperglycemia without ketosis 09/24/2015  . CKD (chronic kidney disease) stage 2, GFR 60-89 ml/min 09/24/2015  . History of renal transplant 09/24/2015  . Uncontrolled type 1 diabetes mellitus with foot ulcer (HCC) 06/04/2015  . History of renal transplantation 06/04/2015  . Foot ulcer, right (HCC)   . Foot abscess, right   . Diarrhea 06/02/2015  . DM (diabetes mellitus) type 2, uncontrolled, with ketoacidosis (HCC) 06/02/2015  . Type 1 diabetes mellitus with diabetic foot ulcer (HCC) 06/02/2015  . Acute renal failure superimposed on stage 3 chronic kidney disease (HCC) 06/02/2015  . Diabetic foot infection (HCC) 06/01/2015  . Essential hypertension 06/01/2015  . GERD (gastroesophageal reflux disease) 06/01/2015  . End stage renal disease (HCC) 10/24/2012  . H/O kidney transplant 08/04/2012  . High anion gap metabolic acidosis 08/04/2012  . Sepsis (HCC) 08/04/2012  . DKA, type 1 (HCC) 09/22/2011  . Hyperkalemia 09/22/2011  . Immunosuppression (HCC)   . Hypothyroidism  Past Surgical History:  Procedure Laterality Date  . AMPUTATION Left 05/11/2014   Procedure: AMPUTATION LEFT GREAT TOE;  Surgeon: Kathryne Hitchhristopher Y Blackman, MD;  Location: [facility];  Service: Orthopedics;  Laterality: Left;  . AV FISTULA PLACEMENT Right    forearm  . BACK SURGERY    . CATARACT EXTRACTION W/ INTRAOCULAR LENS  IMPLANT, BILATERAL Bilateral   . CESAREAN SECTION  07/1999  . DG AV DIALYSIS GRAFT DECLOT OR    . INCISION AND DRAINAGE Right 06/02/2015     Procedure: INCISION AND DRAINAGE OF RIGHT 3rd RAY RESECTION;  Surgeon: Kathryne Hitchhristopher Y Blackman, MD;  Location: [facility] OR;  Service: Orthopedics;  Laterality: Right;  . 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 LUMBAR DISC SURGERY  2001  . NEPHRECTOMY TRANSPLANTED ORGAN    . TOE AMPUTATION Right    1,2 & 3rd toes.  . TUBAL LIGATION  07/1999    OB History    No data available       Home Medications    Prior to Admission medications   Medication Sig Start Date End Date Taking? Authorizing Provider  acetaminophen (TYLENOL) 325 MG tablet Take 2 tablets (650 mg total) by mouth every 6 (six) hours as needed for mild pain, moderate pain, fever or headache. 05/15/16  Yes Elease EtienneAnand D Hongalgi, MD  amLODipine (NORVASC) 5 MG tablet Take 5 mg by mouth daily.   Yes Historical Provider, MD  aspirin EC 81 MG tablet Take 81 mg by mouth daily.   Yes Historical Provider, MD  carvedilol (COREG) 6.25 MG tablet Take 1 tablet (6.25 mg total) by mouth 2 (two) times daily. 06/30/16  Yes Rodolph Bonganiel V Thompson, MD  doxycycline (VIBRAMYCIN) 100 MG capsule Take 1 capsule (100 mg total) by mouth 2 (two) times daily. 07/14/16  Yes Donnetta HutchingBrian Cook, MD  HYDROcodone-acetaminophen (NORCO/VICODIN) 5-325 MG tablet Take 1-2 tablets by mouth every 6 (six) hours as needed for severe pain or moderate pain. 06/30/16  Yes Rodolph Bonganiel V Thompson, MD  insulin aspart (NOVOLOG) 100 UNIT/ML injection Before each meal 3 times a day, 140-199 - 2 units, 200-250 - 4 units, 251-299 - 6 units,  300-349 - 8 units,  350 or above 10 units. Insulin PEN if approved, provide syringes and needles if needed. 04/18/16  Yes Albertine GratesFang Xu, MD  insulin NPH-regular Human (NOVOLIN 70/30) (70-30) 100 UNIT/ML injection Inject 30 Units into the skin 2 (two) times daily with a meal. 06/30/16  Yes Rodolph Bonganiel V Thompson, MD  levothyroxine (SYNTHROID, LEVOTHROID) 137 MCG tablet Take 1 tablet (137 mcg total) by mouth daily. BRAND NAME ONLY 03/04/15  Yes Alison MurrayAlma M Devine, MD  mycophenolate (CELLCEPT)  250 MG capsule Take 500 mg by mouth 2 (two) times daily.    Yes Historical Provider, MD  predniSONE (DELTASONE) 5 MG tablet Take 5 mg by mouth daily. 05/29/15  Yes Historical Provider, MD  ranitidine (ZANTAC) 150 MG tablet Take 150 mg by mouth daily. 03/27/16  Yes Historical Provider, MD  SSD 1 % cream Apply 1 application topically daily.  06/10/16  Yes Historical Provider, MD  tacrolimus (PROGRAF) 0.5 MG capsule Take 0.5 mg by mouth every morning.    Yes Historical Provider, MD  tacrolimus (PROGRAF) 1 MG capsule Take 1 mg by mouth at bedtime.   Yes Historical Provider, MD  cholestyramine (QUESTRAN) 4 g packet Take 1 packet (4 g total) by mouth 2 (two) times daily. Take for 10 days then stop. 06/30/16 07/10/16  Rodolph Bonganiel V Thompson, MD  Family History Family History  Problem Relation Age of Onset  . Emphysema Mother   . Throat cancer Mother   . COPD Mother   . Cancer Mother   . Emphysema Father   . COPD Father   . Stroke Father   . ADD / ADHD Son     Social History Social History  Substance Use Topics  . Smoking status: Former Smoker    Packs/day: 1.00    Years: 11.00    Types: Cigarettes    Quit date: 09/21/1998  . Smokeless tobacco: Never Used  . Alcohol use No     Allergies   Ace inhibitors; Adhesive [tape]; and Lisinopril   Review of Systems Review of Systems  Constitutional: Positive for fatigue. Negative for fever.  HENT: Negative for congestion.   Eyes: Negative for visual disturbance.  Respiratory: Negative for shortness of breath.   Cardiovascular: Negative for chest pain.  Gastrointestinal: Negative for abdominal pain, nausea and vomiting.  Genitourinary: Negative for dysuria.  Musculoskeletal: Negative for myalgias.  Skin: Positive for wound.  Neurological: Positive for headaches. Negative for dizziness, weakness and numbness.     Physical Exam Updated Vital Signs BP 106/58   Pulse 91   Temp 98 F (36.7 C) (Oral)   Resp 20   Ht 5' 8.5" (1.74 m)   Wt  87.5 kg   LMP 11/19/2011   SpO2 97%   BMI 28.92 kg/m   Physical Exam  Constitutional: She is oriented to person, place, and time. She appears well-developed and well-nourished. No distress.  HENT:  Head: Normocephalic and atraumatic.  Cardiovascular: Normal rate, regular rhythm and normal heart sounds.   No murmur heard. Pulmonary/Chest: Effort normal and breath sounds normal. No respiratory distress. She has no wheezes. She has no rales.  Abdominal: Soft. Bowel sounds are normal. She exhibits no distension. There is no tenderness.  Musculoskeletal: She exhibits no edema.  Neurological: She is alert and oriented to person, place, and time.  Skin: Skin is warm and dry.  RLE with well-healing 2.5 cm ulcer which is clean and dressed. No surrounding erythema.   Nursing note and vitals reviewed.    ED Treatments / Results  Labs (all labs ordered are listed, but only abnormal results are displayed) Labs Reviewed  BASIC METABOLIC PANEL - Abnormal; Notable for the following:       Result Value   Sodium 129 (*)    Chloride 94 (*)    CO2 14 (*)    Glucose, Bld 676 (*)    BUN 32 (*)    Creatinine, Ser 1.98 (*)    GFR calc non Af Amer 28 (*)    GFR calc Af Amer 33 (*)    Anion gap 21 (*)    All other components within normal limits  CBC - Abnormal; Notable for the following:    Hemoglobin 11.6 (*)    All other components within normal limits  URINALYSIS, ROUTINE W REFLEX MICROSCOPIC (NOT [facility]) - Abnormal; Notable for the following:    Glucose, UA >1000 (*)    Ketones, ur 40 (*)    Protein, ur 100 (*)    All other components within normal limits  URINE MICROSCOPIC-ADD ON - Abnormal; Notable for the following:    Squamous Epithelial / LPF 0-5 (*)    Bacteria, UA RARE (*)    All other components within normal limits  I-STAT VENOUS BLOOD GAS, ED - Abnormal; Notable for the following:    pH, Ven  7.223 (*)    pCO2, Ven 28.2 (*)    Bicarbonate 11.6 (*)    Acid-base deficit 15.0  (*)    All other components within normal limits  BLOOD GAS, VENOUS  CBG MONITORING, ED  I-STAT BETA HCG BLOOD, ED ([facility], [facility], [facility] ONLY)    EKG  EKG Interpretation None       Radiology No results found.  Procedures Procedures (including critical care time)  CRITICAL CARE Performed by: Chase Picket Ward   Total critical care time: 35 minutes  Critical care time was exclusive of separately billable procedures and treating other patients.  Critical care was necessary to treat or prevent imminent or life-threatening deterioration.  Critical care was time spent personally by me on the following activities: development of treatment plan with patient and/or surrogate as well as nursing, discussions with consultants, evaluation of patient's response to treatment, examination of patient, obtaining history from patient or surrogate, ordering and performing treatments and interventions, ordering and review of laboratory studies, ordering and review of radiographic studies, pulse oximetry and re-evaluation of patient's condition.  Medications Ordered in ED Medications  insulin regular (NOVOLIN R,HUMULIN R) 250 Units in sodium chloride 0.9 % 250 mL (1 Units/mL) infusion (not administered)  dextrose 5 %-0.45 % sodium chloride infusion (not administered)  sodium chloride 0.9 % bolus 1,000 mL (1,000 mLs Intravenous New Bag/Given 07/21/16 2033)    And  0.9 %  sodium chloride infusion (not administered)  sodium chloride 0.9 % bolus 1,000 mL (0 mLs Intravenous Stopped 07/21/16 2022)     Initial Impression / Assessment and Plan / ED Course  I have reviewed the triage vital signs and the nursing notes.  Pertinent labs & imaging results that were available during my care of the patient were reviewed by me and considered in my medical decision making (see chart for details).  Clinical Course    [name] is a 50 y.o. female who presents to ED for hyperglycemia. On exam, patient is  afebrile with reassuring vital signs. Benign abdominal exam. CMP with glucose of 676, anion gap 21. UA with 40 ketones. Ph of 7.223 with bicarb of 11.6. Started on IV fluids and glucose stabilizer. Consulted hospitalist who will admit.   Patient discussed with Dr. Jacqulyn Bath who agrees with treatment plan.   Final Clinical Impressions(s) / ED Diagnoses   Final diagnoses:  None    New Prescriptions New Prescriptions   No medications on file     [facility] Ward, PA-C 07/21/16 2101    Maia Plan, MD 07/22/16 [phone]

## 2016-07-22 DIAGNOSIS — E1159 Type 2 diabetes mellitus with other circulatory complications: Secondary | ICD-10-CM

## 2016-07-22 DIAGNOSIS — Z794 Long term (current) use of insulin: Secondary | ICD-10-CM

## 2016-07-22 DIAGNOSIS — R748 Abnormal levels of other serum enzymes: Secondary | ICD-10-CM

## 2016-07-22 DIAGNOSIS — I872 Venous insufficiency (chronic) (peripheral): Secondary | ICD-10-CM

## 2016-07-22 DIAGNOSIS — Z940 Kidney transplant status: Secondary | ICD-10-CM

## 2016-07-22 DIAGNOSIS — E039 Hypothyroidism, unspecified: Secondary | ICD-10-CM

## 2016-07-22 LAB — BASIC METABOLIC PANEL
Anion gap: 13 (ref 5–15)
Anion gap: 8 (ref 5–15)
Anion gap: 9 (ref 5–15)
Anion gap: 4 — ABNORMAL LOW (ref 5–15)
BUN: 37 mg/dL — AB (ref 6–20)
BUN: 34 mg/dL — AB (ref 6–20)
BUN: 34 mg/dL — ABNORMAL HIGH (ref 6–20)
BUN: 27 mg/dL — AB (ref 6–20)
CHLORIDE: 103 mmol/L (ref 101–111)
CHLORIDE: 104 mmol/L (ref 101–111)
CHLORIDE: 105 mmol/L (ref 101–111)
CHLORIDE: 105 mmol/L (ref 101–111)
CO2: 16 mmol/L — AB (ref 22–32)
CO2: 22 mmol/L (ref 22–32)
CO2: 20 mmol/L — AB (ref 22–32)
CO2: 22 mmol/L (ref 22–32)
CREATININE: 1.89 mg/dL — AB (ref 0.44–1.00)
CREATININE: 1.58 mg/dL — AB (ref 0.44–1.00)
CREATININE: 1.43 mg/dL — AB (ref 0.44–1.00)
CREATININE: 1.47 mg/dL — AB (ref 0.44–1.00)
Calcium: 9.1 mg/dL (ref 8.9–10.3)
Calcium: 9.4 mg/dL (ref 8.9–10.3)
Calcium: 9.6 mg/dL (ref 8.9–10.3)
Calcium: 9.6 mg/dL (ref 8.9–10.3)
GFR calc Af Amer: 35 mL/min — ABNORMAL LOW (ref >60)
GFR calc Af Amer: 47 mL/min — ABNORMAL LOW (ref >60)
GFR calc non Af Amer: 30 mL/min — ABNORMAL LOW (ref >60)
GFR calc non Af Amer: 37 mL/min — ABNORMAL LOW (ref >60)
GFR calc non Af Amer: 42 mL/min — ABNORMAL LOW (ref >60)
GFR calc non Af Amer: 41 mL/min — ABNORMAL LOW (ref >60)
GFR, EST AFRICAN AMERICAN: 43 mL/min — AB (ref >60)
GFR, EST AFRICAN AMERICAN: 49 mL/min — AB (ref >60)
Glucose, Bld: 352 mg/dL — ABNORMAL HIGH (ref 65–99)
Glucose, Bld: 147 mg/dL — ABNORMAL HIGH (ref 65–99)
Glucose, Bld: 99 mg/dL (ref 65–99)
Glucose, Bld: 262 mg/dL — ABNORMAL HIGH (ref 65–99)
POTASSIUM: 4 mmol/L (ref 3.5–5.1)
Potassium: 3.7 mmol/L (ref 3.5–5.1)
Potassium: 3.6 mmol/L (ref 3.5–5.1)
Potassium: 4 mmol/L (ref 3.5–5.1)
Sodium: 132 mmol/L — ABNORMAL LOW (ref 135–145)
Sodium: 134 mmol/L — ABNORMAL LOW (ref 135–145)
Sodium: 134 mmol/L — ABNORMAL LOW (ref 135–145)
Sodium: 131 mmol/L — ABNORMAL LOW (ref 135–145)

## 2016-07-22 LAB — TROPONIN I
TROPONIN I: 0.39 ng/mL — AB (ref <0.03)
Troponin I: 0.22 ng/mL (ref <0.03)
Troponin I: 0.47 ng/mL (ref <0.03)

## 2016-07-22 LAB — GLUCOSE, CAPILLARY
GLUCOSE-CAPILLARY: 413 mg/dL — AB (ref 65–99)
GLUCOSE-CAPILLARY: 279 mg/dL — AB (ref 65–99)
GLUCOSE-CAPILLARY: 248 mg/dL — AB (ref 65–99)
GLUCOSE-CAPILLARY: 100 mg/dL — AB (ref 65–99)
GLUCOSE-CAPILLARY: 108 mg/dL — AB (ref 65–99)
GLUCOSE-CAPILLARY: 103 mg/dL — AB (ref 65–99)
Glucose-Capillary: 101 mg/dL — ABNORMAL HIGH (ref 65–99)
Glucose-Capillary: 150 mg/dL — ABNORMAL HIGH (ref 65–99)
Glucose-Capillary: 152 mg/dL — ABNORMAL HIGH (ref 65–99)
Glucose-Capillary: 181 mg/dL — ABNORMAL HIGH (ref 65–99)
Glucose-Capillary: 270 mg/dL — ABNORMAL HIGH (ref 65–99)
Glucose-Capillary: 379 mg/dL — ABNORMAL HIGH (ref 65–99)
Glucose-Capillary: 489 mg/dL — ABNORMAL HIGH (ref 65–99)
Glucose-Capillary: >600 mg/dL (ref 65–99)

## 2016-07-22 LAB — HCG, QUANTITATIVE, PREGNANCY: HCG, BETA CHAIN, QUANT, S: 12 m[IU]/mL — AB (ref <5)

## 2016-07-22 MED ORDER — SODIUM CHLORIDE 0.9 % IV SOLN
INTRAVENOUS | Status: DC
  Administered 2016-07-22 – 2016-07-23 (×2): via INTRAVENOUS

## 2016-07-22 MED ORDER — INSULIN ASPART 100 UNIT/ML ~~LOC~~ SOLN
5.0000 [IU] | Freq: Once | SUBCUTANEOUS | Status: AC
  Administered 2016-07-22: 5 [IU] via SUBCUTANEOUS

## 2016-07-22 MED ORDER — INSULIN ASPART 100 UNIT/ML ~~LOC~~ SOLN
0.0000 [IU] | Freq: Three times a day (TID) | SUBCUTANEOUS | Status: DC
  Administered 2016-07-22: 4 [IU] via SUBCUTANEOUS
  Administered 2016-07-23 (×2): 7 [IU] via SUBCUTANEOUS

## 2016-07-22 MED ORDER — PROCHLORPERAZINE EDISYLATE 5 MG/ML IJ SOLN
5.0000 mg | INTRAMUSCULAR | Status: DC | PRN
  Filled 2016-07-22: qty 2

## 2016-07-22 MED ORDER — INSULIN DETEMIR 100 UNIT/ML ~~LOC~~ SOLN
50.0000 [IU] | Freq: Two times a day (BID) | SUBCUTANEOUS | Status: DC
  Administered 2016-07-22: 50 [IU] via SUBCUTANEOUS
  Filled 2016-07-22 (×2): qty 0.5

## 2016-07-22 MED ORDER — INSULIN DETEMIR 100 UNIT/ML ~~LOC~~ SOLN
22.0000 [IU] | Freq: Two times a day (BID) | SUBCUTANEOUS | Status: DC
  Administered 2016-07-23: 22 [IU] via SUBCUTANEOUS
  Filled 2016-07-22 (×2): qty 0.22

## 2016-07-22 NOTE — Progress Notes (Signed)
Inpatient Diabetes Program Recommendations  AACE/ADA: New Consensus Statement on Inpatient Glycemic Control (2015)  Target Ranges:  Prepandial:   less than 140 mg/dL      Peak postprandial:   less than 180 mg/dL (1-2 hours)      Critically ill patients:  140 - 180 mg/dL   Results for [name], [name] (MRN [id number]) as of 07/22/2016 13:30  03/22/2014 09:13 10/22/2014 19:27 03/02/2015 11:56 11/02/2015 21:23 03/30/2016 06:25 05/13/2016 06:45  Hemoglobin A1C 11.5 (H) 11.9 (H) 11.6 (H) 11.7 (H) 12.3 (H) 10.3 (H)   Spoke with patient about diabetes and home regimen for diabetes control. Patient reports that she is followed by Dr. Anson CroftsKaren Ricker with [facility] from DM management. Patient reports that she is taking insulin as prescribed. Patient states that they check their glucose 2 times per day and that it is sometimes "high" and sometimes "low."  Discussed A1C trends (not being less than 10% in 4 years). Discussed glucose and A1C goals. Discussed importance of checking CBGs and maintaining good CBG control to prevent long-term and short-term complications. Explained how hyperglycemia leads to damage within blood vessels which lead to the common complications seen with uncontrolled diabetes.  Encouraged patient to continue to check glucose. Encouraged patient to follow with her PCP immediately after discharge and to get a referral for an Endocrinologist for glucose control.  Patient verbalized understanding of information discussed and he states that he has no further questions at this time related to diabetes.  Thanks,  Christena DeemShannon Jaylynn Siefert RN, MSN, [facility] Inpatient Diabetes Coordinator Team Pager [phone] (8a-5p)

## 2016-07-22 NOTE — Progress Notes (Signed)
PROGRESS NOTE [facility]   [name]   ZOX:[id number] DOB: Aug 16, 1966  DOA: 07/21/2016 PCP: Dois DavenportICHTER,KAREN L., MD   Brief Narrative:  [name] is a 50 y.o F with hx of renal transplant on immunosupresion therapy, DM type 2 and HTN who was admitted with DKA, blood glucose >600 and anion gap of 21. Patient was started on insulin drip and IVF. No clear etiology for DKA has been found. Patient have a wound on R lower extremity which does not look infected   Subjective: Patient seen and examined at bedside. Patient have no complaints today, didn't sleep well last night. Patient states that she takes her medicine as prescribed. Denies, chest pain, sob, palpitations, n/v and diarrhea, no dysuria or abdominal pain.   Assessment & Plan:  DKA - no clear precipitating factors - anion gap closed  Start D5/45NS Start SSI and LA insuline  Monitor CBG's Check lytes  Can advance diet as tolerated   Hypertension - stable  Resume home medications   Hypothyroid  Continue Synthroid  Wound of the right lower extremity - Chronic stasis ulcer - does not look infected  Per wound care recommendations   Hx of renal transplant  Resume home medications prednisone, tacrolimus and mycophenolate Monitor kidney function  Elevated troponin - no chest pain, no EKG changes ? Demand ischemia, could be related to kidney disease Trend TNI Repeat EKG in the morning  Elevated BHCG - patient is postmenopausal, have no hx of vaginal bleed.  This can be follow up as outpatient if needed, possible a false positive   DVT prophylaxis: Heparin Code Status: Full Family Communication: None at bedside Disposition Plan: Anticipate discharge home in 24-48 hrs   Consultants:   Wound Care  Procedures:   None   Antimicrobials:  Doxycycline - home med    Objective: Vitals:   07/21/16 2150 07/22/16 0016 07/22/16 0420 07/22/16 0739  BP: (!) 117/53 (!) 108/97  134/66  Pulse: 92 97  81    Resp: 19 14    Temp:  97.4 F (36.3 C) 98.4 F (36.9 C) 97.7 F (36.5 C)  TempSrc:  Oral Axillary Oral  SpO2: 100% 100%  96%  Weight: 89.4 kg (197 lb 1.5 oz)     Height: 5\' 7"  (1.702 m)       Intake/Output Summary (Last 24 hours) at 07/22/16 0831 Last data filed at 07/22/16 0600  Gross per 24 hour  Intake          2827.36 ml  Output                2 ml  Net          2825.36 ml   Filed Weights   07/21/16 1731 07/21/16 2150  Weight: 87.5 kg (193 lb) 89.4 kg (197 lb 1.5 oz)    Examination:  General exam: Appears calm and comfortable  Respiratory system: Clear to auscultation. No wheezes,crackle or rhonchi Cardiovascular system: S1 & S2 heard, RRR. No JVD, murmurs, rubs or gallops Gastrointestinal system: Abdomen is nondistended, soft and nontender. Normal bowel sounds heard. Central nervous system: Alert and oriented. No focal neurological deficits. Extremities: No pedal edema. Symmetric, strength 5/5   Skin: Ulcerated lesion, R lateral lower extremity, ~4x3cm red base with some suppurative yellow discharge.  Psychiatry: Judgement and insight appear normal. Mood & affect appropriate.    Data Reviewed: I have personally reviewed following labs and imaging studies  CBC:  Recent Labs Lab 07/21/16 1754  WBC  7.4  HGB 11.6*  HCT 37.4  MCV 89.0  PLT 211   Basic Metabolic Panel:  Recent Labs Lab 07/21/16 1754 07/22/16 0327 07/22/16 0737  NA 129* 132* 134*  K 4.7 3.7 3.6  CL 94* 103 104  CO2 14* 16* 22  GLUCOSE 676* 352* 147*  BUN 32* 37* 34*  CREATININE 1.98* 1.89* 1.58*  CALCIUM 10.0 9.1 9.4   GFR: Estimated Creatinine Clearance: 49.4 mL/min (by C-G formula based on SCr of 1.58 mg/dL (H)). Liver Function Tests: No results for input(s): AST, ALT, ALKPHOS, BILITOT, PROT, ALBUMIN in the last 168 hours. No results for input(s): LIPASE, AMYLASE in the last 168 hours. No results for input(s): AMMONIA in the last 168 hours. Coagulation Profile: No results for  input(s): INR, PROTIME in the last 168 hours. Cardiac Enzymes:  Recent Labs Lab 07/21/16 2357 07/22/16 0515  TROPONINI <0.03 0.22*   BNP (last 3 results) No results for input(s): PROBNP in the last 8760 hours. HbA1C: No results for input(s): HGBA1C in the last 72 hours. CBG:  Recent Labs Lab 07/22/16 0311 07/22/16 0416 07/22/16 0514 07/22/16 0617 07/22/16 0724  GLUCAP 379* 279* 248* 181* 150*   Lipid Profile: No results for input(s): CHOL, HDL, LDLCALC, TRIG, CHOLHDL, LDLDIRECT in the last 72 hours. Thyroid Function Tests: No results for input(s): TSH, T4TOTAL, FREET4, T3FREE, THYROIDAB in the last 72 hours. Anemia Panel: No results for input(s): VITAMINB12, FOLATE, FERRITIN, TIBC, IRON, RETICCTPCT in the last 72 hours. Sepsis Labs: No results for input(s): PROCALCITON, LATICACIDVEN in the last 168 hours.  No results found for this or any previous visit (from the past 240 hour(s)).    Radiology Studies: No results found.    Scheduled Meds: . amLODipine  5 mg Oral Daily  . aspirin EC  81 mg Oral Daily  . carvedilol  6.25 mg Oral BID  . doxycycline  100 mg Oral BID  . famotidine  20 mg Oral Q24H  . heparin  5,000 Units Subcutaneous Q8H  . levothyroxine  137 mcg Oral QAC breakfast  . mycophenolate  500 mg Oral BID  . predniSONE  5 mg Oral Daily  . tacrolimus  0.5 mg Oral BH-q7a  . tacrolimus  1 mg Oral QHS   Continuous Infusions: . sodium chloride Stopped (07/22/16 0516)  . dextrose 5 % and 0.45% NaCl 125 mL/hr at 07/22/16 0515  . insulin (NOVOLIN-R) infusion 5.4 Units/hr (07/22/16 0726)     LOS: 1 day    Latrelle DodrillEdwin Silva, MD [facility] Pager [phone]  If 7PM-7AM, please contact night-coverage [email or web address] Password [email or web address] 07/22/2016, 8:31 AM

## 2016-07-22 NOTE — Consult Note (Addendum)
WOC Nurse wound consult note Reason for Consult: Consult requested for right posterior leg wound.  [facility]; refer to progress note on 10/22. Wound has decreased in size since previous assessment. Wound type: Chronic stasis ulcer which began as a traumatic injury. Measurement: 3X3X.1cm Wound bed: 80% red, interspersed throughout with 20% yellow Drainage (amount, consistency, odor) Mod amt yellow drainage, no odor Periwound: Intact skin surrounding Dressing procedure/placement/frequency: Continue present plan of care with Aquacel to absorb drainage and provide antimicrobial benefits, which she was using prior to admission.  Discussed plan of care with patient and she verbalized understanding. Please re-consult if further assistance is needed.  Thank-you,  Cammie Mcgeeawn Leverne Amrhein MSN, RN, CWOCN, [facility], [facility] [phone]

## 2016-07-23 ENCOUNTER — Other Ambulatory Visit: Payer: Self-pay

## 2016-07-23 DIAGNOSIS — L97519 Non-pressure chronic ulcer of other part of right foot with unspecified severity: Secondary | ICD-10-CM

## 2016-07-23 LAB — GLUCOSE, CAPILLARY
GLUCOSE-CAPILLARY: 236 mg/dL — AB (ref 65–99)
GLUCOSE-CAPILLARY: 243 mg/dL — AB (ref 65–99)
Glucose-Capillary: 187 mg/dL — ABNORMAL HIGH (ref 65–99)
Glucose-Capillary: 261 mg/dL — ABNORMAL HIGH (ref 65–99)

## 2016-07-23 LAB — PREGNANCY, URINE: Preg Test, Ur: NEGATIVE

## 2016-07-23 MED ORDER — INSULIN DETEMIR 100 UNIT/ML FLEXPEN: 25.0000 [IU] | PEN_INJECTOR | Freq: Two times a day (BID) | SUBCUTANEOUS | 0 refills | Status: DC

## 2016-07-23 NOTE — Discharge Summary (Signed)
Physician Discharge Summary  [name]  ZOX:[id number]  DOB: [birth year]  DOA: 07/21/2016 PCP: Dois Davenport., MD  Admit date: 07/21/2016 Discharge date: 07/23/2016  Admitted From: Home  Disposition:  Home   Recommendations for Outpatient Follow-up:  1. Follow up with PCP in 1-2 weeks 2. Please obtain BMP/CBC in one week 3. Follow up with Nephrology   Home Health: None Equipment/Devices: None  Discharge Condition: Stable  CODE STATUS: Full  Diet recommendation: Heart Healthy / Carb Modified  Brief/Interim Summary: [name] is a 50 y.o F with hx of renal transplant on immunosupresion therapy, DM type 2 and HTN who was admitted with DKA, blood glucose >600 and anion gap of 21. Patient was started on insulin drip and IVF. No clear etiology for DKA has been found. Patient have a wound on R lower extremity which does not look infected. EKG has resolved after IV insulin, anion gap closed. Patient was started on Levemir twice a day with good control of glucose. She was seen by wound care nurse for her stasis ulcer no new interventions were needed. Patient has significantly improved now tolerating diet and doing well. She will be discharged home on Levemir 25 units twice a day with follow-up with PMD.  Subjective: Patient seen and examined today has no complaints. No acute events overnight. Patient tolerated diet well and ambulating. Denies chest pain, shortness of breath, dizziness, palpitations, nausea and vomiting.  Discharge Diagnoses:  DKA - no clear precipitating factors - anion gap closed, although patient reported that she's compliant with medications unclear if she actually is following instructions patient with multiple admissions for DKA. Will switch insulin 70/30 for  Levemir she will start 25 units twice a day. Patient was instructed if CBGs in the morning above 200 to increase 2 units, and discussed with primary care physician, patient verbalizes  understanding Monitor fingersticks goal <140 fasting Follow-up with PMD 1 week Get A1c in 2 months  Hypertension - stable  Resume home medications   Hypothyroid  Continue Synthroid  Wound of the right lower extremity - Chronic stasis ulcer - does not look infected  Patient on chronic doxycycline which will help with wound care Continue Aquacel dressing daily Follow-up with PMD  Hx of renal transplant  Resume home medications prednisone, tacrolimus and mycophenolate Monitor kidney function Follow-up with kidney doctor  Elevated troponin - no chest pain, no EKG changes likely Demand ischemia, vs kidney disease TNIs Flat  EKG with no acute changes Need to follow up with cardiology eventually may need stress test   Elevated BHCG - patient is postmenopausal, have no hx of vaginal bleed.  This can be follow up as outpatient if needed, possible a false positive   Discharge Instructions  Discharge Instructions    Call MD for:  difficulty breathing, headache or visual disturbances    Complete by:  As directed    Call MD for:  extreme fatigue    Complete by:  As directed    Call MD for:  hives    Complete by:  As directed    Call MD for:  persistant dizziness or light-headedness    Complete by:  As directed    Call MD for:  persistant nausea and vomiting    Complete by:  As directed    Call MD for:  redness, tenderness, or signs of infection (pain, swelling, redness, odor or green/yellow discharge around incision site)    Complete by:  As directed    Call MD  for:  severe uncontrolled pain    Complete by:  As directed    Call MD for:  temperature >100.4    Complete by:  As directed    Diet - low sodium heart healthy    Complete by:  As directed    Diet Carb Modified    Complete by:  As directed    Discharge wound care:    Complete by:  As directed    Continue Aquacel daily   Increase activity slowly    Complete by:  As directed        Medication List    STOP  taking these medications   cholestyramine 4 g packet Commonly known as:  QUESTRAN   insulin NPH-regular Human (70-30) 100 UNIT/ML injection Commonly known as:  NOVOLIN 70/30     TAKE these medications   acetaminophen 325 MG tablet Commonly known as:  TYLENOL Take 2 tablets (650 mg total) by mouth every 6 (six) hours as needed for mild pain, moderate pain, fever or headache.   amLODipine 5 MG tablet Commonly known as:  NORVASC Take 5 mg by mouth daily.   aspirin EC 81 MG tablet Take 81 mg by mouth daily.   carvedilol 6.25 MG tablet Commonly known as:  COREG Take 1 tablet (6.25 mg total) by mouth 2 (two) times daily.   doxycycline 100 MG capsule Commonly known as:  VIBRAMYCIN Take 1 capsule (100 mg total) by mouth 2 (two) times daily.   HYDROcodone-acetaminophen 5-325 MG tablet Commonly known as:  NORCO/VICODIN Take 1-2 tablets by mouth every 6 (six) hours as needed for severe pain or moderate pain.   insulin aspart 100 UNIT/ML injection Commonly known as:  novoLOG Before each meal 3 times a day, 140-199 - 2 units, 200-250 - 4 units, 251-299 - 6 units,  300-349 - 8 units,  350 or above 10 units. Insulin PEN if approved, provide syringes and needles if needed.   Insulin Detemir 100 UNIT/ML Pen Commonly known as:  LEVEMIR Inject 25 Units into the skin 2 (two) times daily.   levothyroxine 137 MCG tablet Commonly known as:  SYNTHROID, LEVOTHROID Take 1 tablet (137 mcg total) by mouth daily. BRAND NAME ONLY   mycophenolate 250 MG capsule Commonly known as:  CELLCEPT Take 500 mg by mouth 2 (two) times daily.   predniSONE 5 MG tablet Commonly known as:  DELTASONE Take 5 mg by mouth daily.   ranitidine 150 MG tablet Commonly known as:  ZANTAC Take 150 mg by mouth daily.   SSD 1 % cream Generic drug:  silver sulfADIAZINE Apply 1 application topically daily.   tacrolimus 1 MG capsule Commonly known as:  PROGRAF Take 1 mg by mouth at bedtime.   tacrolimus 0.5 MG  capsule Commonly known as:  PROGRAF Take 0.5 mg by mouth every morning.       Allergies  Allergen Reactions  . Ace Inhibitors Cough  . Adhesive [Tape] Itching and Other (See Comments)    Please use paper tape  . Lisinopril Cough    Consultations:  None    Procedures/Studies: Dg Tibia/fibula Right  Result Date: 06/27/2016 CLINICAL DATA:  Wound cellulitis.  Rule out osteomyelitis. EXAM: RIGHT TIBIA AND FIBULA - 2 VIEW COMPARISON:  05/27/2011 FINDINGS: Again noted are skin densities or calcifications along the lateral aspect of the lower leg. There is no evidence for a fracture or dislocation. Diffuse vascular calcifications. No evidence for periosteal reaction or cortical destruction. IMPRESSION: No acute abnormality. Electronically Signed  By: Richarda OverlieAdam  Henn M.D.   On: 06/27/2016 13:42      Discharge Exam: Vitals:   07/23/16 1037 07/23/16 1310  BP: (!) 149/76 (!) 154/70  Pulse: 76 77  Resp:  18  Temp: 97.7 F (36.5 C) 97.7 F (36.5 C)   Vitals:   07/23/16 0615 07/23/16 0856 07/23/16 1037 07/23/16 1310  BP: (!) 162/71 (!) 161/70 (!) 149/76 (!) 154/70  Pulse: 78 72 76 77  Resp:    18  Temp: 97.7 F (36.5 C)  97.7 F (36.5 C) 97.7 F (36.5 C)  TempSrc: Oral  Oral Oral  SpO2: 94%  100% 96%  Weight:      Height:        General: Pt is alert, awake, not in acute distress Cardiovascular: RRR, S1/S2 +, no rubs, no gallops Respiratory: CTA bilaterally, no wheezing, no rhonchi Abdominal: Soft, NT, ND, bowel sounds + Extremities: no edema, no cyanosis Skin: Ulcerated lesion R lateral lower extremity ~4x3cm, red with good granulation tissue.    The results of significant diagnostics from this hospitalization (including imaging, microbiology, ancillary and laboratory) are listed below for reference.     Microbiology: No results found for this or any previous visit (from the past 240 hour(s)).   Labs: BNP (last 3 results) No results for input(s): BNP in the last 8760  hours. Basic Metabolic Panel:  Recent Labs Lab 07/21/16 1754 07/22/16 0327 07/22/16 0737 07/22/16 1113 07/22/16 2032  NA 129* 132* 134* 134* 131*  K 4.7 3.7 3.6 4.0 4.0  CL 94* 103 104 105 105  CO2 14* 16* 22 20* 22  GLUCOSE 676* 352* 147* 99 262*  BUN 32* 37* 34* 34* 27*  CREATININE 1.98* 1.89* 1.58* 1.43* 1.47*  CALCIUM 10.0 9.1 9.4 9.6 9.6   Liver Function Tests: No results for input(s): AST, ALT, ALKPHOS, BILITOT, PROT, ALBUMIN in the last 168 hours. No results for input(s): LIPASE, AMYLASE in the last 168 hours. No results for input(s): AMMONIA in the last 168 hours. CBC:  Recent Labs Lab 07/21/16 1754  WBC 7.4  HGB 11.6*  HCT 37.4  MCV 89.0  PLT 211   Cardiac Enzymes:  Recent Labs Lab 07/21/16 2357 07/22/16 0515 07/22/16 1113 07/22/16 1638  TROPONINI <0.03 0.22* 0.47* 0.39*   BNP: Invalid input(s): POCBNP CBG:  Recent Labs Lab 07/22/16 1604 07/22/16 2206 07/23/16 0137 07/23/16 0801 07/23/16 1307  GLUCAP 152* 270* 187* 236* 243*   D-Dimer No results for input(s): DDIMER in the last 72 hours. Hgb A1c No results for input(s): HGBA1C in the last 72 hours. Lipid Profile No results for input(s): CHOL, HDL, LDLCALC, TRIG, CHOLHDL, LDLDIRECT in the last 72 hours. Thyroid function studies No results for input(s): TSH, T4TOTAL, T3FREE, THYROIDAB in the last 72 hours.  Invalid input(s): FREET3 Anemia work up No results for input(s): VITAMINB12, FOLATE, FERRITIN, TIBC, IRON, RETICCTPCT in the last 72 hours. Urinalysis    Component Value Date/Time   COLORURINE YELLOW 07/21/2016 1905   APPEARANCEUR CLEAR 07/21/2016 1905   LABSPEC 1.024 07/21/2016 1905   PHURINE 5.5 07/21/2016 1905   GLUCOSEU >1000 (A) 07/21/2016 1905   HGBUR NEGATIVE 07/21/2016 1905   BILIRUBINUR NEGATIVE 07/21/2016 1905   KETONESUR 40 (A) 07/21/2016 1905   PROTEINUR 100 (A) 07/21/2016 1905   UROBILINOGEN 0.2 03/02/2015 1153   NITRITE NEGATIVE 07/21/2016 1905   LEUKOCYTESUR  NEGATIVE 07/21/2016 1905   Sepsis Labs Invalid input(s): PROCALCITONIN,  WBC,  LACTICIDVEN Microbiology No results found for this or  any previous visit (from the past 240 hour(s)).   Time coordinating discharge: Over 30 minutes  SIGNED:  Latrelle Dodrill, MD  [facility] 07/23/2016, 2:47 PM Pager   If 7PM-7AM, please contact night-coverage [email or web address] Password [email or web address]

## 2016-07-23 NOTE — Progress Notes (Signed)
Received [facility] from [facility] accompanied by RN and CNA.  Patient AOx4, ambulatory, VS stable with elevated BP, pain at 2/10.  Patient oriented to room, bed controls and call light.  Patient now resting on bed comfortably.  Will monitor.

## 2016-07-23 NOTE — Progress Notes (Signed)
Nutrition Brief Note  Patient identified on the Malnutrition Screening Tool (MST) Report  Wt Readings from Last 15 Encounters:  07/21/16 197 lb 1.5 oz (89.4 kg)  06/30/16 196 lb 3.4 oz (89 kg)  05/13/16 192 lb 7.4 oz (87.3 kg)  04/11/16 230 lb (104.3 kg)  03/27/16 230 lb (104.3 kg)  03/01/16 225 lb (102.1 kg)  11/05/15 220 lb 7.4 oz (100 kg)  09/26/15 223 lb (101.2 kg)  06/02/15 213 lb 3 oz (96.7 kg)  05/23/15 225 lb (102.1 kg)  04/20/15 216 lb (98 kg)  03/02/15 214 lb 11.7 oz (97.4 kg)  01/02/15 226 lb 13.7 oz (102.9 kg)  10/29/14 215 lb 3.2 oz (97.6 kg)  10/22/14 208 lb 8.9 oz (94.6 kg)   [name] is a 50 y.o. female with history of renal transplant on antirejection medication, diabetes mellitus type 2, hypertension was brought to the ER after patient had elevated blood sugar. Patient states she has been compliant with her medications. Patient denies any chest pain or shortness of breath nausea vomiting or diarrhea. In the ER patient's blood sugar was found to be in the 600s with an anion gap of 21. Patient is being admitted for diabetic ketoacidosis. Patient has chronic wound of the right lower extremity with no active discharge.  Pt admitted with DKA.   Spoke with pt at bedside, who reports feeling. She reports her appetite is currently good, which is typical for her. Pt shares that her appetite was poor 2-3 days PTA, due to not feeling well. Pt was unable to provide an accurate diet hx, but states she usually consumes 2 meals per day ("stew and chips") and consumes low calorie beverages, such as diet coke and water.   Pt shares that she was diagnosed with DM over 20 years ago and has always struggled with control. Per pt, her biggest barrier to optimal control is remembering to take her medications. Per PTA medication list, home DM medications are 30 units Novolin 70/30 BID. Discussed ways pt could become more compliant with medication regimen, including setting an alarm to  help her remember to take her medication. Also briefly reviewed information in DM coordinator's note from 07/22/16, however, pt reports "I don't remember; I have a poor memory". Agree with DM coordinator recommendation for endocrinology follow-up.   Per [facility] note on 07/22/16, pt with lt lateral leg chronic stasis ulcer, which is improving.  Nutrition-Focused physical exam completed. Findings are mild fat depletion, no muscle depletion, and no edema.   Body mass index is 30.87 kg/m. Patient meets criteria for obesity, class I based on current BMI.   Current diet order is carb modified, patient is consuming approximately 75% of meals at this time. Labs and medications reviewed.   No nutrition interventions warranted at this time. If nutrition issues arise, please consult RD.   Teegan Guinther A. Mayford KnifeWilliams, RD, LDN, CDE Pager: [phone] After hours Pager: [phone]

## 2016-07-23 NOTE — Progress Notes (Signed)
Pt discharged to home.  Discharge instructions explained to pt.  Pt has no questions at the time of discharge.  IV removed.  Pt states she has all belongings.  Pt taken off unit via wheelchair by staff.   

## 2016-07-23 NOTE — Care Management Note (Signed)
Case Management Note  Patient Details  Name: [name] MRN: [id number] Date of Birth: February 08, 1966  Subjective/Objective:                    Action/Plan:  Consult for medication assistance. Patient has insurance unable to assist further. Expected Discharge Date:                  Expected Discharge Plan:     In-House Referral:     Discharge planning Services  CM Consult, Medication Assistance  Post Acute Care Choice:    Choice offered to:     DME Arranged:    DME Agency:     HH Arranged:    HH Agency:     Status of Service:  In process, will continue to follow  If discussed at Long Length of Stay Meetings, dates discussed:    Additional Comments:  Kingsley PlanWile, Xandria Gallaga Marie, RN 07/23/2016, 10:55 AM

## 2016-07-27 ENCOUNTER — Other Ambulatory Visit: Payer: Self-pay

## 2016-07-27 NOTE — Patient Outreach (Signed)
[facility] Care Management  07/27/2016  [name] 14-May-1966 [id number]  Third telephone call to patient. Unable to reach patient or leave message.  Phone only rang.   PLAN; RNCM will send patient outreach letter to attempt contact  George InaDavina Maksym Pfiffner RN,BSN,CCM [facility] Telephonic  [phone]

## 2016-07-27 NOTE — Patient Outreach (Signed)
[facility] Care Management  07/27/2016  Exie Parodyamela S Pack 1966-01-21 [id number]   Late entry for 07/23/16: Telephone call to patient regarding ED census referral. Unable to reach patient. HIPAA compliant voice message left with call back phone number.   PLAN:  RNCM will attempt 3rd telephone call to patient within 1 week.  George InaDavina Kadee Philyaw RN,BSN,CCM [facility] Telephonic  [phone]

## 2016-08-10 ENCOUNTER — Other Ambulatory Visit: Payer: Self-pay

## 2016-08-10 IMAGING — CR DG ABDOMEN ACUTE W/ 1V CHEST
4 series · 4 of 4 positions shown · non-contrast
Comparison: Ultrasound 05/05/2014.  KUB 05/04/2014.

CLINICAL DATA: Emesis.

EXAM:
ACUTE ABDOMEN SERIES (ABDOMEN 2 VIEW & CHEST 1 VIEW)

[w chest pa *]
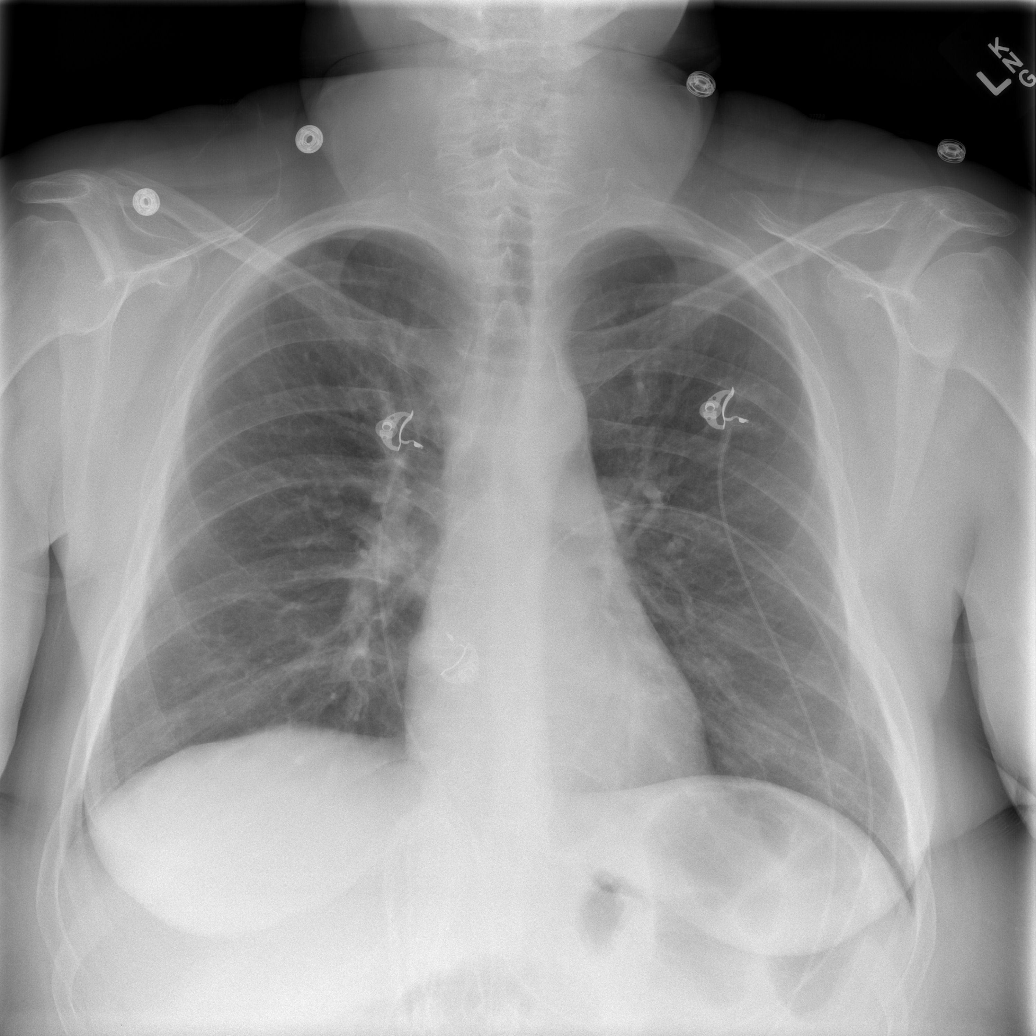

[w abdomen upright *]
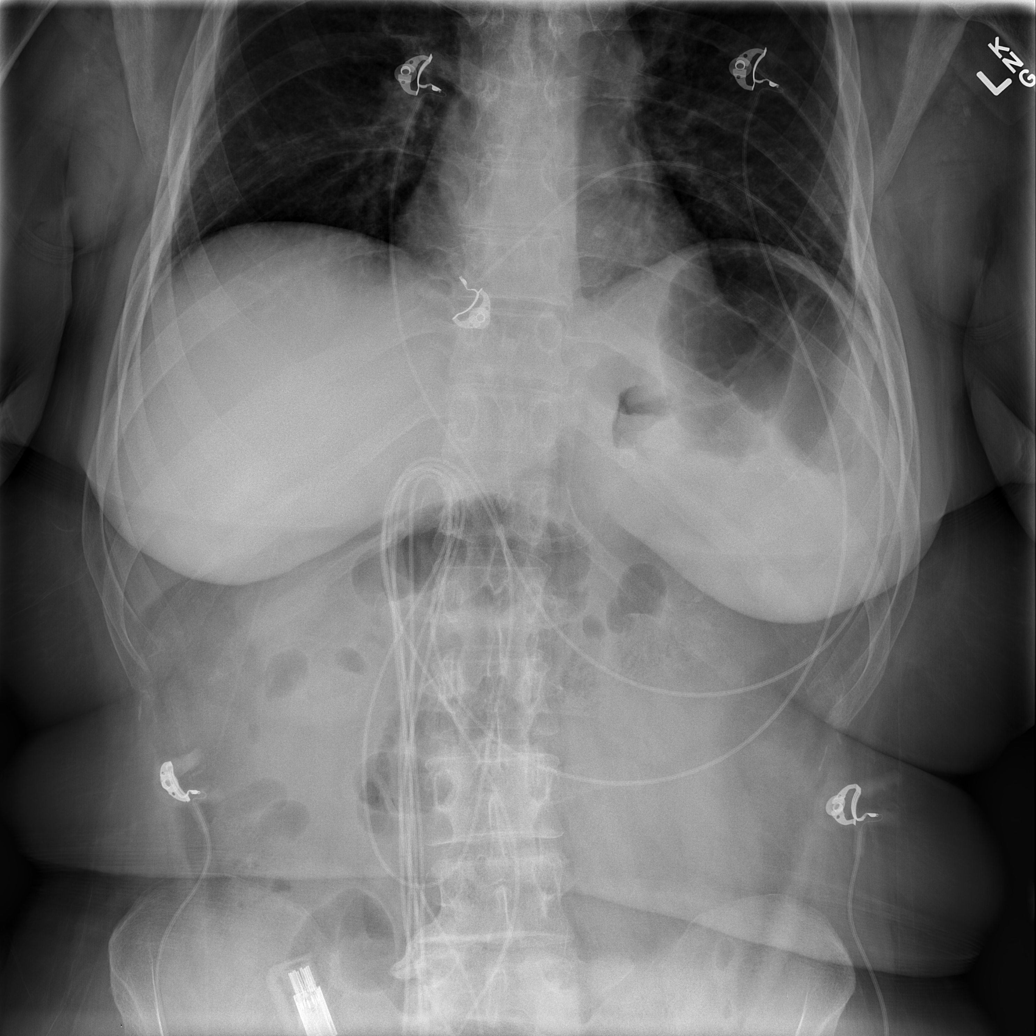

[t abdomen supine (1 of 2)]
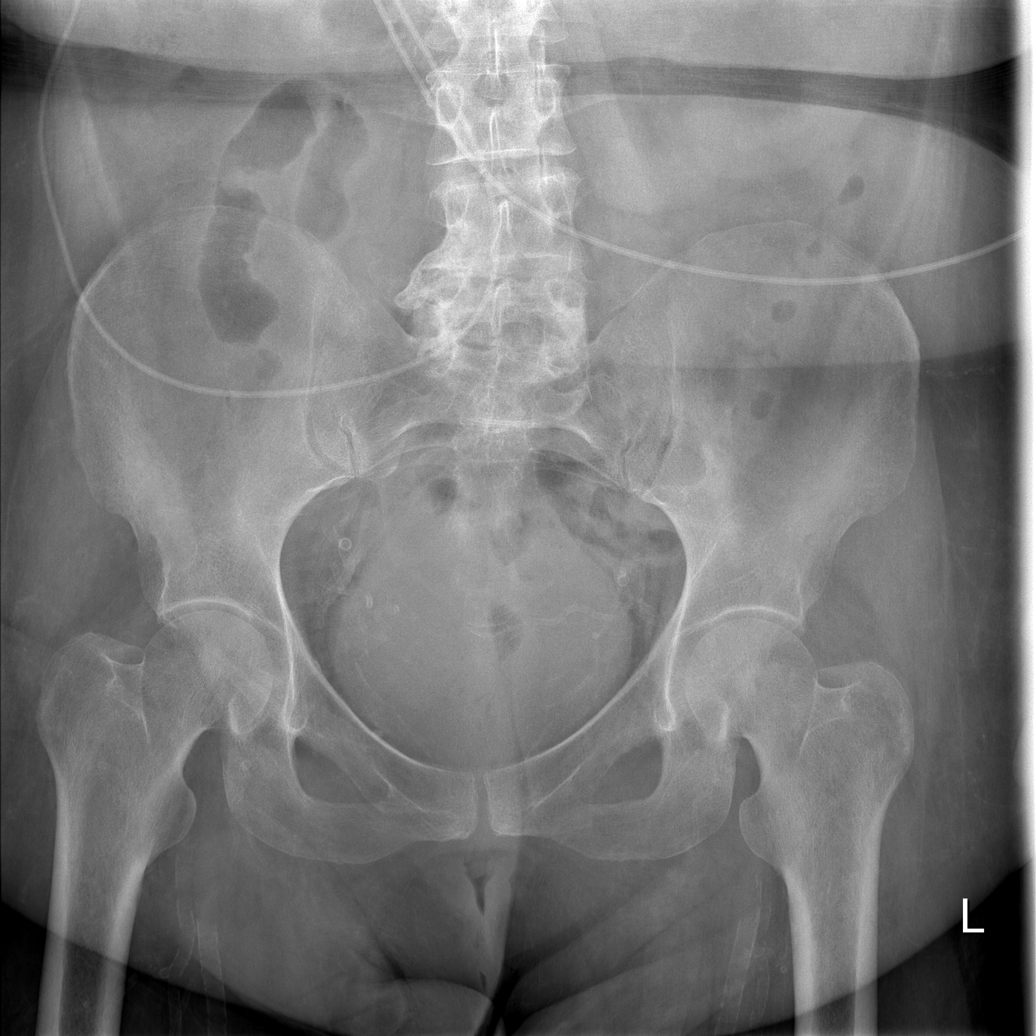

[t abdomen supine (2 of 2)]
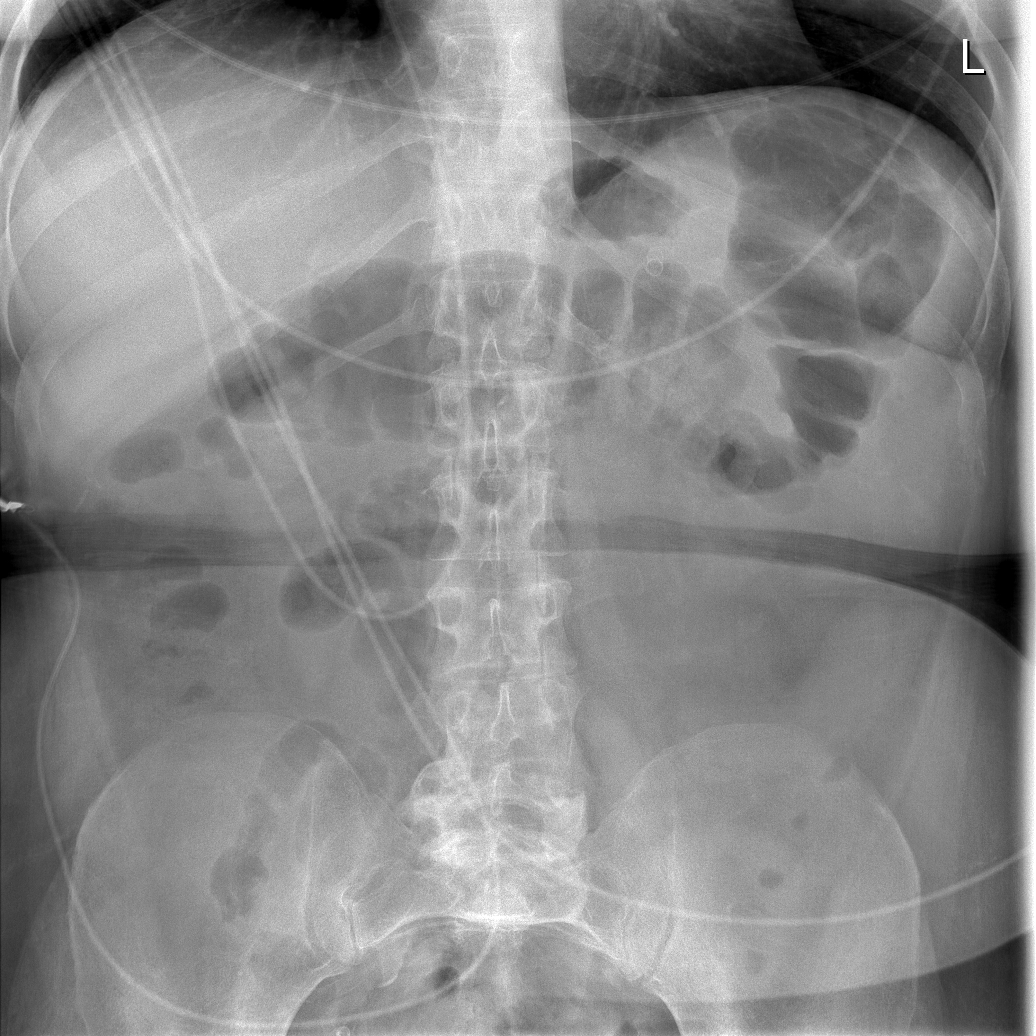

[4 of 4 positions shown; findings below may reference images not displayed]

FINDINGS: No acute cardiopulmonary disease. Soft tissue structures are
unremarkable. No bowel distention. No free air. No acute bony
abnormality identified. Degenerative changes lumbar spine.
Aortoiliac atherosclerotic vascular disease.
IMPRESSION: 1. No acute cardiopulmonary disease. No acute intra-abdominal
abnormality identified. No bowel distention.
2. Atherosclerotic vascular disease.

## 2016-08-10 NOTE — Patient Outreach (Signed)
[facility] Care Management  08/10/2016  [name] [birth year] [id number]  No response from patient to telephone calls or outreach letter. PLAN: RNCM will refer patient to care management assistant to close due to being unable to contact.  RNCM will notify patients primary MD of closure and inability to contact.  George [name] RN,BSN,CCM [facility] Telephonic  [phone]

## 2016-08-11 ENCOUNTER — Inpatient Hospital Stay (HOSPITAL_COMMUNITY)
Admission: EM | Admit: 2016-08-11 | Discharge: 2016-08-14 | DRG: 638 | Disposition: A | Discharge status: Home / Self Care | Payer: Medicare Other | Attending: Internal Medicine | Admitting: Internal Medicine

## 2016-08-11 ENCOUNTER — Encounter (HOSPITAL_COMMUNITY): Payer: Self-pay | Admitting: Emergency Medicine

## 2016-08-11 DIAGNOSIS — E1310 Other specified diabetes mellitus with ketoacidosis without coma: Secondary | ICD-10-CM

## 2016-08-11 DIAGNOSIS — N183 Chronic kidney disease, stage 3 unspecified: Secondary | ICD-10-CM | POA: Diagnosis present

## 2016-08-11 DIAGNOSIS — Z808 Family history of malignant neoplasm of other organs or systems: Secondary | ICD-10-CM

## 2016-08-11 DIAGNOSIS — Z825 Family history of asthma and other chronic lower respiratory diseases: Secondary | ICD-10-CM

## 2016-08-11 DIAGNOSIS — E1110 Type 2 diabetes mellitus with ketoacidosis without coma: Secondary | ICD-10-CM | POA: Diagnosis not present

## 2016-08-11 DIAGNOSIS — E860 Dehydration: Secondary | ICD-10-CM | POA: Diagnosis present

## 2016-08-11 DIAGNOSIS — H6692 Otitis media, unspecified, left ear: Secondary | ICD-10-CM | POA: Diagnosis present

## 2016-08-11 DIAGNOSIS — Z940 Kidney transplant status: Secondary | ICD-10-CM

## 2016-08-11 DIAGNOSIS — J329 Chronic sinusitis, unspecified: Secondary | ICD-10-CM | POA: Diagnosis present

## 2016-08-11 DIAGNOSIS — Z961 Presence of intraocular lens: Secondary | ICD-10-CM | POA: Diagnosis present

## 2016-08-11 DIAGNOSIS — M170 Bilateral primary osteoarthritis of knee: Secondary | ICD-10-CM | POA: Diagnosis present

## 2016-08-11 DIAGNOSIS — Z794 Long term (current) use of insulin: Secondary | ICD-10-CM

## 2016-08-11 DIAGNOSIS — Z823 Family history of stroke: Secondary | ICD-10-CM

## 2016-08-11 DIAGNOSIS — Z87891 Personal history of nicotine dependence: Secondary | ICD-10-CM

## 2016-08-11 DIAGNOSIS — IMO0002 Reserved for concepts with insufficient information to code with codable children: Secondary | ICD-10-CM

## 2016-08-11 DIAGNOSIS — Z9842 Cataract extraction status, left eye: Secondary | ICD-10-CM

## 2016-08-11 DIAGNOSIS — L97509 Non-pressure chronic ulcer of other part of unspecified foot with unspecified severity: Secondary | ICD-10-CM

## 2016-08-11 DIAGNOSIS — Z91048 Other nonmedicinal substance allergy status: Secondary | ICD-10-CM

## 2016-08-11 DIAGNOSIS — I959 Hypotension, unspecified: Secondary | ICD-10-CM | POA: Diagnosis present

## 2016-08-11 DIAGNOSIS — Z7952 Long term (current) use of systemic steroids: Secondary | ICD-10-CM

## 2016-08-11 DIAGNOSIS — I129 Hypertensive chronic kidney disease with stage 1 through stage 4 chronic kidney disease, or unspecified chronic kidney disease: Secondary | ICD-10-CM | POA: Diagnosis present

## 2016-08-11 DIAGNOSIS — E871 Hypo-osmolality and hyponatremia: Secondary | ICD-10-CM | POA: Diagnosis present

## 2016-08-11 DIAGNOSIS — M479 Spondylosis, unspecified: Secondary | ICD-10-CM | POA: Diagnosis present

## 2016-08-11 DIAGNOSIS — E1065 Type 1 diabetes mellitus with hyperglycemia: Secondary | ICD-10-CM

## 2016-08-11 DIAGNOSIS — R739 Hyperglycemia, unspecified: Secondary | ICD-10-CM | POA: Diagnosis not present

## 2016-08-11 DIAGNOSIS — E875 Hyperkalemia: Secondary | ICD-10-CM | POA: Diagnosis present

## 2016-08-11 DIAGNOSIS — Z888 Allergy status to other drugs, medicaments and biological substances status: Secondary | ICD-10-CM

## 2016-08-11 DIAGNOSIS — M19022 Primary osteoarthritis, left elbow: Secondary | ICD-10-CM | POA: Diagnosis present

## 2016-08-11 DIAGNOSIS — E039 Hypothyroidism, unspecified: Secondary | ICD-10-CM | POA: Diagnosis present

## 2016-08-11 DIAGNOSIS — N179 Acute kidney failure, unspecified: Secondary | ICD-10-CM | POA: Diagnosis present

## 2016-08-11 DIAGNOSIS — K219 Gastro-esophageal reflux disease without esophagitis: Secondary | ICD-10-CM | POA: Diagnosis present

## 2016-08-11 DIAGNOSIS — E10621 Type 1 diabetes mellitus with foot ulcer: Secondary | ICD-10-CM

## 2016-08-11 DIAGNOSIS — I10 Essential (primary) hypertension: Secondary | ICD-10-CM

## 2016-08-11 DIAGNOSIS — S81802A Unspecified open wound, left lower leg, initial encounter: Secondary | ICD-10-CM | POA: Diagnosis present

## 2016-08-11 DIAGNOSIS — M19021 Primary osteoarthritis, right elbow: Secondary | ICD-10-CM | POA: Diagnosis present

## 2016-08-11 DIAGNOSIS — Z7982 Long term (current) use of aspirin: Secondary | ICD-10-CM

## 2016-08-11 DIAGNOSIS — Z9841 Cataract extraction status, right eye: Secondary | ICD-10-CM

## 2016-08-11 LAB — CBC WITH DIFFERENTIAL/PLATELET
BASOS ABS: 0 10*3/uL (ref 0.0–0.1)
Basophils Relative: 1 %
EOS ABS: 0 10*3/uL (ref 0.0–0.7)
EOS PCT: 0 %
HCT: 35.8 % — ABNORMAL LOW (ref 36.0–46.0)
Hemoglobin: 11.3 g/dL — ABNORMAL LOW (ref 12.0–15.0)
LYMPHS PCT: 14 %
Lymphs Abs: 1 10*3/uL (ref 0.7–4.0)
MCH: 28.5 pg (ref 26.0–34.0)
MCHC: 31.6 g/dL (ref 30.0–36.0)
MCV: 90.2 fL (ref 78.0–100.0)
MONO ABS: 0.4 10*3/uL (ref 0.1–1.0)
Monocytes Relative: 6 %
Neutro Abs: 5.5 10*3/uL (ref 1.7–7.7)
Neutrophils Relative %: 79 %
PLATELETS: 203 10*3/uL (ref 150–400)
RBC: 3.97 MIL/uL (ref 3.87–5.11)
RDW: 13.6 % (ref 11.5–15.5)
WBC: 6.9 10*3/uL (ref 4.0–10.5)

## 2016-08-11 LAB — I-STAT CHEM 8, ED
BUN: 43 mg/dL — ABNORMAL HIGH (ref 6–20)
CREATININE: 1.7 mg/dL — AB (ref 0.44–1.00)
Calcium, Ion: 1.25 mmol/L (ref 1.15–1.40)
Chloride: 97 mmol/L — ABNORMAL LOW (ref 101–111)
HCT: 35 % — ABNORMAL LOW (ref 36.0–46.0)
HEMOGLOBIN: 11.9 g/dL — AB (ref 12.0–15.0)
Potassium: 5.4 mmol/L — ABNORMAL HIGH (ref 3.5–5.1)
SODIUM: 126 mmol/L — AB (ref 135–145)
TCO2: 12 mmol/L (ref 0–100)

## 2016-08-11 LAB — COMPREHENSIVE METABOLIC PANEL
ALT: 14 U/L (ref 14–54)
AST: 13 U/L — ABNORMAL LOW (ref 15–41)
Albumin: 2.7 g/dL — ABNORMAL LOW (ref 3.5–5.0)
Alkaline Phosphatase: 119 U/L (ref 38–126)
Anion gap: 25 — ABNORMAL HIGH (ref 5–15)
BUN: 51 mg/dL — ABNORMAL HIGH (ref 6–20)
CHLORIDE: 94 mmol/L — AB (ref 101–111)
CO2: 8 mmol/L — ABNORMAL LOW (ref 22–32)
Calcium: 9.3 mg/dL (ref 8.9–10.3)
Creatinine, Ser: 2.28 mg/dL — ABNORMAL HIGH (ref 0.44–1.00)
GFR, EST AFRICAN AMERICAN: 28 mL/min — AB (ref >60)
GFR, EST NON AFRICAN AMERICAN: 24 mL/min — AB (ref >60)
Glucose, Bld: 779 mg/dL (ref 65–99)
POTASSIUM: 5.5 mmol/L — AB (ref 3.5–5.1)
SODIUM: 127 mmol/L — AB (ref 135–145)
Total Bilirubin: 1.7 mg/dL — ABNORMAL HIGH (ref 0.3–1.2)
Total Protein: 6.1 g/dL — ABNORMAL LOW (ref 6.5–8.1)

## 2016-08-11 LAB — I-STAT VENOUS BLOOD GAS, ED
Acid-base deficit: 18 mmol/L — ABNORMAL HIGH (ref 0.0–2.0)
BICARBONATE: 8.7 mmol/L — AB (ref 20.0–28.0)
O2 Saturation: 79 %
PCO2 VEN: 24.5 mmHg — AB (ref 44.0–60.0)
PH VEN: 7.161 — AB (ref 7.250–7.430)
TCO2: 9 mmol/L (ref 0–100)
pO2, Ven: 53 mmHg — ABNORMAL HIGH (ref 32.0–45.0)

## 2016-08-11 LAB — CBG MONITORING, ED

## 2016-08-11 MED ORDER — SODIUM CHLORIDE 0.9 % IV BOLUS (SEPSIS)
1000.0000 mL | Freq: Once | INTRAVENOUS | Status: AC
  Administered 2016-08-11: 1000 mL via INTRAVENOUS

## 2016-08-11 MED ORDER — SODIUM CHLORIDE 0.9 % IV SOLN
INTRAVENOUS | Status: DC
  Administered 2016-08-12: 5.4 [IU]/h via INTRAVENOUS
  Filled 2016-08-11: qty 2.5

## 2016-08-11 MED ORDER — SODIUM CHLORIDE 0.9 % IV BOLUS (SEPSIS)
1000.0000 mL | Freq: Once | INTRAVENOUS | Status: AC
Start: 2016-08-11 — End: 2016-08-12
  Administered 2016-08-12: 1000 mL via INTRAVENOUS

## 2016-08-11 MED ORDER — ONDANSETRON HCL 4 MG/2ML IJ SOLN
4.0000 mg | Freq: Once | INTRAMUSCULAR | Status: AC
  Administered 2016-08-11: 4 mg via INTRAVENOUS
  Filled 2016-08-11: qty 2

## 2016-08-11 NOTE — ED Notes (Signed)
Made PA aware of Pt's cbg of 779.

## 2016-08-11 NOTE — ED Triage Notes (Signed)
Per EMS: Pt c/o of NVD and hypotension.  Pt says her CBG this AM was 400 and she took her insulin 70/30. Pt was taken to the fire station by her husband to have her sugar tested and it was >500. In the ambulance it was >650. Pt is A&Ox4 but his hazy about MX HX.  Pt has received 1000 mL NS. BP on scene was 76/40 and after fluid 90/44.

## 2016-08-11 NOTE — ED Provider Notes (Signed)
[facility]-EMERGENCY DEPT Provider Note   CSN: [id number] Arrival date & time: 08/11/16  2218     History   Chief Complaint Chief Complaint  Patient presents with  . Hyperglycemia    HPI [name] is a 50 y.o. female.  50 year old female with a past medical history of kidney transplant in 2011 (on immunosuppressant therapy), insulin-dependent diabetes, hypertension, and hypothyroid presents to the emergency department for complaints of high blood sugar. Patient states that she felt her blood sugar become high after lunch today. She reports taking her insulin in the morning. She has declined missing any doses and denies recent fever or infectious symptoms. She has had polyuria as well as polydipsia. Patient reporting 2 episodes of vomiting prior to arrival. She has mild nausea at this time. No abdominal pain, chest pain, or shortness of breath. She was transported to the emergency department by EMS. Hx of frequent admissions secondary to DKA.   The history is provided by the patient. No language interpreter was used.  Hyperglycemia    Past Medical History:  Diagnosis Date  . Arthritis    "elbows, knees, legs, back" (09/24/2015)  . CKD (chronic kidney disease)   . Daily headache   . Depression    "years ago"  . End stage renal disease (HCC)    right arm AV graft, post transplant  . Hypertension   . Hypothyroid   . 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 Kidney disease   . Pneumonia ~ 2007?  [name] Type II diabetes mellitus (HCC)    Insulin dependant    Patient Active Problem List   Diagnosis Date Noted  . DKA, type 2 (HCC) 07/21/2016  . Hypoglycemia   . Wound cellulitis   . CKD (chronic kidney disease) stage 3, GFR 30-59 ml/min 06/26/2016  . Cellulitis 06/26/2016  . Cellulitis of right lower extremity   . Hyperglycemia   . QT prolongation 05/13/2016  . UTI (lower urinary tract infection)   . Hypomagnesemia   . Pressure ulcer 04/11/2016  . Acute  encephalopathy 04/11/2016  . Altered mental state 04/11/2016  . Altered mental status 04/10/2016  . Insulin dependent diabetes mellitus (HCC) 04/10/2016  . Diabetic ketoacidosis without coma associated with type 2 diabetes mellitus (HCC) 11/02/2015  . Hyperglycemia without ketosis 09/24/2015  . CKD (chronic kidney disease) stage 2, GFR 60-89 ml/min 09/24/2015  . History of renal transplant 09/24/2015  . Uncontrolled type 1 diabetes mellitus with foot ulcer (HCC) 06/04/2015  . History of renal transplantation 06/04/2015  . Foot ulcer, right (HCC)   . Foot abscess, right   . Diarrhea 06/02/2015  . DM (diabetes mellitus) type 2, uncontrolled, with ketoacidosis (HCC) 06/02/2015  . Type 1 diabetes mellitus with diabetic foot ulcer (HCC) 06/02/2015  . Acute renal failure superimposed on stage 3 chronic kidney disease (HCC) 06/02/2015  . Diabetic foot infection (HCC) 06/01/2015  . Essential hypertension 06/01/2015  . GERD (gastroesophageal reflux disease) 06/01/2015  . End stage renal disease (HCC) 10/24/2012  . H/O kidney transplant 08/04/2012  . High anion gap metabolic acidosis 08/04/2012  . Sepsis (HCC) 08/04/2012  . DKA, type 1 (HCC) 09/22/2011  . Hyperkalemia 09/22/2011  . Immunosuppression (HCC)   . Hypothyroidism     Past Surgical History:  Procedure Laterality Date  . AMPUTATION Left 05/11/2014   Procedure: AMPUTATION LEFT GREAT TOE;  Surgeon: Kathryne Hitch, MD;  Location: [facility];  Service: Orthopedics;  Laterality: Left;  . AV FISTULA PLACEMENT Right  forearm  . BACK SURGERY    . CATARACT EXTRACTION W/ INTRAOCULAR LENS  IMPLANT, BILATERAL Bilateral   . CESAREAN SECTION  07/1999  . DG AV DIALYSIS GRAFT DECLOT OR    . INCISION AND DRAINAGE Right 06/02/2015   Procedure: INCISION AND DRAINAGE OF RIGHT 3rd RAY RESECTION;  Surgeon: Kathryne Hitchhristopher Y Blackman, MD;  Location: [facility] OR;  Service: Orthopedics;  Laterality: Right;  . 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 LUMBAR  DISC SURGERY  2001  . NEPHRECTOMY TRANSPLANTED ORGAN    . TOE AMPUTATION Right    1,2 & 3rd toes.  . TUBAL LIGATION  07/1999    OB History    No data available       Home Medications    Prior to Admission medications   Medication Sig Start Date End Date Taking? Authorizing Provider  acetaminophen (TYLENOL) 325 MG tablet Take 2 tablets (650 mg total) by mouth every 6 (six) hours as needed for mild pain, moderate pain, fever or headache. 05/15/16   Elease EtienneAnand D Hongalgi, MD  amLODipine (NORVASC) 5 MG tablet Take 5 mg by mouth daily.    Historical Provider, MD  aspirin EC 81 MG tablet Take 81 mg by mouth daily.    Historical Provider, MD  carvedilol (COREG) 6.25 MG tablet Take 1 tablet (6.25 mg total) by mouth 2 (two) times daily. 06/30/16   Rodolph Bonganiel V Thompson, MD  doxycycline (VIBRAMYCIN) 100 MG capsule Take 1 capsule (100 mg total) by mouth 2 (two) times daily. 07/14/16   Donnetta HutchingBrian Cook, MD  HYDROcodone-acetaminophen (NORCO/VICODIN) 5-325 MG tablet Take 1-2 tablets by mouth every 6 (six) hours as needed for severe pain or moderate pain. 06/30/16   Rodolph Bonganiel V Thompson, MD  insulin aspart (NOVOLOG) 100 UNIT/ML injection Before each meal 3 times a day, 140-199 - 2 units, 200-250 - 4 units, 251-299 - 6 units,  300-349 - 8 units,  350 or above 10 units. Insulin PEN if approved, provide syringes and needles if needed. 04/18/16   Albertine GratesFang Xu, MD  Insulin Detemir (LEVEMIR) 100 UNIT/ML Pen Inject 25 Units into the skin 2 (two) times daily. 07/23/16   Lenox PondsEdwin Silva Zapata, MD  levothyroxine (SYNTHROID, LEVOTHROID) 137 MCG tablet Take 1 tablet (137 mcg total) by mouth daily. BRAND NAME ONLY 03/04/15   Alison MurrayAlma M Devine, MD  mycophenolate (CELLCEPT) 250 MG capsule Take 500 mg by mouth 2 (two) times daily.     Historical Provider, MD  predniSONE (DELTASONE) 5 MG tablet Take 5 mg by mouth daily. 05/29/15   Historical Provider, MD  ranitidine (ZANTAC) 150 MG tablet Take 150 mg by mouth daily. 03/27/16   Historical Provider, MD    SSD 1 % cream Apply 1 application topically daily.  06/10/16   Historical Provider, MD  tacrolimus (PROGRAF) 0.5 MG capsule Take 0.5 mg by mouth every morning.     Historical Provider, MD  tacrolimus (PROGRAF) 1 MG capsule Take 1 mg by mouth at bedtime.    Historical Provider, MD    Family History Family History  Problem Relation Age of Onset  . Emphysema Mother   . Throat cancer Mother   . COPD Mother   . Cancer Mother   . Emphysema Father   . COPD Father   . Stroke Father   . ADD / ADHD Son     Social History Social History  Substance Use Topics  . Smoking status: Former Smoker    Packs/day: 1.00    Years: 11.00  Types: Cigarettes    Quit date: 09/21/1998  . Smokeless tobacco: Never Used  . Alcohol use No     Allergies   Ace inhibitors; Adhesive [tape]; and Lisinopril   Review of Systems Review of Systems Ten systems reviewed and are negative for acute change, except as noted in the HPI.    Physical Exam Updated Vital Signs BP (!) 96/54   Pulse 83   Resp 16   Ht 5\' 7"  (1.702 m)   Wt 89.4 kg   LMP 11/19/2011   SpO2 98%   BMI 30.85 kg/m   Physical Exam  Constitutional: She is oriented to person, place, and time. She appears well-developed and well-nourished. No distress.  Nontoxic and in NAD  HENT:  Head: Normocephalic and atraumatic.  Dry mm Cheilitis   Eyes: Conjunctivae and EOM are normal. No scleral icterus.  Neck: Normal range of motion.  Cardiovascular: Normal rate, regular rhythm and intact distal pulses.   Pulmonary/Chest: Effort normal. No respiratory distress. She has no wheezes. She has no rales.  Respirations even and unlabored  Musculoskeletal: Normal range of motion.  Neurological: She is alert and oriented to person, place, and time. She exhibits normal muscle tone. Coordination normal.  GCS 15. Patient moving all extremities. No focal deficits.  Skin: Skin is warm and dry. No rash noted. She is not diaphoretic. No erythema. No  pallor.  Psychiatric: She has a normal mood and affect. Her behavior is normal.  Nursing note and vitals reviewed.    ED Treatments / Results  Labs (all labs ordered are listed, but only abnormal results are displayed) Labs Reviewed  CBC WITH DIFFERENTIAL/PLATELET - Abnormal; Notable for the following:       Result Value   Hemoglobin 11.3 (*)    HCT 35.8 (*)    All other components within normal limits  COMPREHENSIVE METABOLIC PANEL - Abnormal; Notable for the following:    Sodium 127 (*)    Potassium 5.5 (*)    Chloride 94 (*)    CO2 8 (*)    Glucose, Bld 779 (*)    BUN 51 (*)    Creatinine, Ser 2.28 (*)    Total Protein 6.1 (*)    Albumin 2.7 (*)    AST 13 (*)    Total Bilirubin 1.7 (*)    GFR calc non Af Amer 24 (*)    GFR calc Af Amer 28 (*)    Anion gap 25 (*)    All other components within normal limits  CBG MONITORING, ED - Abnormal; Notable for the following:    Glucose-Capillary >600 (*)    All other components within normal limits  I-STAT CHEM 8, ED - Abnormal; Notable for the following:    Sodium 126 (*)    Potassium 5.4 (*)    Chloride 97 (*)    BUN 43 (*)    Creatinine, Ser 1.70 (*)    Glucose, Bld >700 (*)    Hemoglobin 11.9 (*)    HCT 35.0 (*)    All other components within normal limits  I-STAT VENOUS BLOOD GAS, ED - Abnormal; Notable for the following:    pH, Ven 7.161 (*)    pCO2, Ven 24.5 (*)    pO2, Ven 53.0 (*)    Bicarbonate 8.7 (*)    Acid-base deficit 18.0 (*)    All other components within normal limits  URINALYSIS, ROUTINE W REFLEX MICROSCOPIC  PREGNANCY, URINE    EKG  EKG Interpretation None  Radiology No results found.  Procedures Procedures (including critical care time)  Medications Ordered in ED Medications  insulin regular (NOVOLIN R,HUMULIN R) 250 Units in sodium chloride 0.9 % 250 mL (1 Units/mL) infusion (not administered)  sodium chloride 0.9 % bolus 1,000 mL (not administered)  sodium chloride 0.9 %  bolus 1,000 mL (0 mLs Intravenous Stopped 08/11/16 2319)  ondansetron (ZOFRAN) injection 4 mg (4 mg Intravenous Given 08/11/16 2251)    CRITICAL CARE Performed by: Antony Madura   Total critical care time: 45 minutes  Critical care time was exclusive of separately billable procedures and treating other patients.  Critical care was necessary to treat or prevent imminent or life-threatening deterioration.  Critical care was time spent personally by me on the following activities: development of treatment plan with patient and/or surrogate as well as nursing, discussions with consultants, evaluation of patient's response to treatment, examination of patient, obtaining history from patient or surrogate, ordering and performing treatments and interventions, ordering and review of laboratory studies, ordering and review of radiographic studies, pulse oximetry and re-evaluation of patient's condition.   Initial Impression / Assessment and Plan / ED Course  I have reviewed the triage vital signs and the nursing notes.  Pertinent labs & imaging results that were available during my care of the patient were reviewed by me and considered in my medical decision making (see chart for details).  Clinical Course     11:12 PM CBG >700. Gap 17 based on chem 8, but clear acidosis confirmed on VBG. Glucose stabilizer started. Formal CBC and CMP pending.  11:43 PM CMP confirms DKA. Mild hyperkalemia which should improve with insulin. Gap is 25. No leukocytosis or fever. Plan for admission.  11:52 PM Oral temp 97.31F. Patient states she was out of insulin x 3 days. Restarted regimen this AM for 1 dose. Case discussed with Dr. Antionette Char who will admit.   Final Clinical Impressions(s) / ED Diagnoses   Final diagnoses:  Diabetic ketoacidosis without coma associated with other specified diabetes mellitus [facility])    New Prescriptions New Prescriptions   No medications on file     Antony Madura, PA-C 08/11/16  2353    Courteney Randall An, MD 08/13/16 1611

## 2016-08-12 ENCOUNTER — Encounter (HOSPITAL_COMMUNITY): Payer: Self-pay | Admitting: Family Medicine

## 2016-08-12 ENCOUNTER — Emergency Department (HOSPITAL_COMMUNITY): Payer: Medicare Other

## 2016-08-12 DIAGNOSIS — I959 Hypotension, unspecified: Secondary | ICD-10-CM | POA: Diagnosis present

## 2016-08-12 DIAGNOSIS — E1110 Type 2 diabetes mellitus with ketoacidosis without coma: Secondary | ICD-10-CM | POA: Diagnosis present

## 2016-08-12 DIAGNOSIS — Z961 Presence of intraocular lens: Secondary | ICD-10-CM | POA: Diagnosis present

## 2016-08-12 DIAGNOSIS — E875 Hyperkalemia: Secondary | ICD-10-CM

## 2016-08-12 DIAGNOSIS — R739 Hyperglycemia, unspecified: Secondary | ICD-10-CM | POA: Diagnosis present

## 2016-08-12 DIAGNOSIS — M479 Spondylosis, unspecified: Secondary | ICD-10-CM | POA: Diagnosis present

## 2016-08-12 DIAGNOSIS — Z7952 Long term (current) use of systemic steroids: Secondary | ICD-10-CM | POA: Diagnosis not present

## 2016-08-12 DIAGNOSIS — E039 Hypothyroidism, unspecified: Secondary | ICD-10-CM | POA: Diagnosis present

## 2016-08-12 DIAGNOSIS — I10 Essential (primary) hypertension: Secondary | ICD-10-CM

## 2016-08-12 DIAGNOSIS — Z940 Kidney transplant status: Secondary | ICD-10-CM | POA: Diagnosis not present

## 2016-08-12 DIAGNOSIS — E1310 Other specified diabetes mellitus with ketoacidosis without coma: Secondary | ICD-10-CM | POA: Diagnosis not present

## 2016-08-12 DIAGNOSIS — Z794 Long term (current) use of insulin: Secondary | ICD-10-CM | POA: Diagnosis not present

## 2016-08-12 DIAGNOSIS — N183 Chronic kidney disease, stage 3 (moderate): Secondary | ICD-10-CM | POA: Diagnosis present

## 2016-08-12 DIAGNOSIS — N179 Acute kidney failure, unspecified: Secondary | ICD-10-CM | POA: Diagnosis present

## 2016-08-12 DIAGNOSIS — E871 Hypo-osmolality and hyponatremia: Secondary | ICD-10-CM | POA: Diagnosis present

## 2016-08-12 DIAGNOSIS — M170 Bilateral primary osteoarthritis of knee: Secondary | ICD-10-CM | POA: Diagnosis present

## 2016-08-12 DIAGNOSIS — J329 Chronic sinusitis, unspecified: Secondary | ICD-10-CM | POA: Diagnosis present

## 2016-08-12 DIAGNOSIS — K219 Gastro-esophageal reflux disease without esophagitis: Secondary | ICD-10-CM | POA: Diagnosis present

## 2016-08-12 DIAGNOSIS — Z9842 Cataract extraction status, left eye: Secondary | ICD-10-CM | POA: Diagnosis not present

## 2016-08-12 DIAGNOSIS — E860 Dehydration: Secondary | ICD-10-CM | POA: Diagnosis present

## 2016-08-12 DIAGNOSIS — M19021 Primary osteoarthritis, right elbow: Secondary | ICD-10-CM | POA: Diagnosis present

## 2016-08-12 DIAGNOSIS — S81802A Unspecified open wound, left lower leg, initial encounter: Secondary | ICD-10-CM | POA: Diagnosis present

## 2016-08-12 DIAGNOSIS — Z87891 Personal history of nicotine dependence: Secondary | ICD-10-CM | POA: Diagnosis not present

## 2016-08-12 DIAGNOSIS — M19022 Primary osteoarthritis, left elbow: Secondary | ICD-10-CM | POA: Diagnosis present

## 2016-08-12 DIAGNOSIS — Z7982 Long term (current) use of aspirin: Secondary | ICD-10-CM | POA: Diagnosis not present

## 2016-08-12 DIAGNOSIS — Z9841 Cataract extraction status, right eye: Secondary | ICD-10-CM | POA: Diagnosis not present

## 2016-08-12 DIAGNOSIS — Z825 Family history of asthma and other chronic lower respiratory diseases: Secondary | ICD-10-CM | POA: Diagnosis not present

## 2016-08-12 LAB — URINALYSIS, ROUTINE W REFLEX MICROSCOPIC
Bilirubin Urine: NEGATIVE
HGB URINE DIPSTICK: NEGATIVE
Ketones, ur: 20 mg/dL — AB
LEUKOCYTES UA: NEGATIVE
NITRITE: NEGATIVE
Protein, ur: 100 mg/dL — AB
SPECIFIC GRAVITY, URINE: 1.018 (ref 1.005–1.030)
pH: 5 (ref 5.0–8.0)

## 2016-08-12 LAB — BASIC METABOLIC PANEL
ANION GAP: 20 — AB (ref 5–15)
ANION GAP: 8 (ref 5–15)
ANION GAP: 12 (ref 5–15)
ANION GAP: 13 (ref 5–15)
BUN: 49 mg/dL — AB (ref 6–20)
BUN: 50 mg/dL — ABNORMAL HIGH (ref 6–20)
BUN: 45 mg/dL — ABNORMAL HIGH (ref 6–20)
BUN: 45 mg/dL — ABNORMAL HIGH (ref 6–20)
CALCIUM: 8.8 mg/dL — AB (ref 8.9–10.3)
CALCIUM: 9.2 mg/dL (ref 8.9–10.3)
CHLORIDE: 102 mmol/L (ref 101–111)
CO2: 11 mmol/L — AB (ref 22–32)
CO2: 20 mmol/L — AB (ref 22–32)
CO2: 17 mmol/L — AB (ref 22–32)
CO2: 15 mmol/L — ABNORMAL LOW (ref 22–32)
Calcium: 8.8 mg/dL — ABNORMAL LOW (ref 8.9–10.3)
Calcium: 9.5 mg/dL (ref 8.9–10.3)
Chloride: 106 mmol/L (ref 101–111)
Chloride: 105 mmol/L (ref 101–111)
Chloride: 104 mmol/L (ref 101–111)
Creatinine, Ser: 2.25 mg/dL — ABNORMAL HIGH (ref 0.44–1.00)
Creatinine, Ser: 1.87 mg/dL — ABNORMAL HIGH (ref 0.44–1.00)
Creatinine, Ser: 1.78 mg/dL — ABNORMAL HIGH (ref 0.44–1.00)
Creatinine, Ser: 1.83 mg/dL — ABNORMAL HIGH (ref 0.44–1.00)
GFR calc Af Amer: 28 mL/min — ABNORMAL LOW (ref >60)
GFR calc Af Amer: 36 mL/min — ABNORMAL LOW (ref >60)
GFR calc non Af Amer: 24 mL/min — ABNORMAL LOW (ref >60)
GFR, EST AFRICAN AMERICAN: 35 mL/min — AB (ref >60)
GFR, EST AFRICAN AMERICAN: 38 mL/min — AB (ref >60)
GFR, EST NON AFRICAN AMERICAN: 31 mL/min — AB (ref >60)
GFR, EST NON AFRICAN AMERICAN: 32 mL/min — AB (ref >60)
GFR, EST NON AFRICAN AMERICAN: 31 mL/min — AB (ref >60)
GLUCOSE: 513 mg/dL — AB (ref 65–99)
GLUCOSE: 219 mg/dL — AB (ref 65–99)
GLUCOSE: 163 mg/dL — AB (ref 65–99)
GLUCOSE: 317 mg/dL — AB (ref 65–99)
POTASSIUM: 4.2 mmol/L (ref 3.5–5.1)
POTASSIUM: 4.1 mmol/L (ref 3.5–5.1)
POTASSIUM: 4.6 mmol/L (ref 3.5–5.1)
POTASSIUM: 4.8 mmol/L (ref 3.5–5.1)
Sodium: 133 mmol/L — ABNORMAL LOW (ref 135–145)
Sodium: 134 mmol/L — ABNORMAL LOW (ref 135–145)
Sodium: 134 mmol/L — ABNORMAL LOW (ref 135–145)
Sodium: 132 mmol/L — ABNORMAL LOW (ref 135–145)

## 2016-08-12 LAB — GLUCOSE, CAPILLARY
GLUCOSE-CAPILLARY: 544 mg/dL — AB (ref 65–99)
GLUCOSE-CAPILLARY: 365 mg/dL — AB (ref 65–99)
GLUCOSE-CAPILLARY: 294 mg/dL — AB (ref 65–99)
GLUCOSE-CAPILLARY: 335 mg/dL — AB (ref 65–99)
GLUCOSE-CAPILLARY: 109 mg/dL — AB (ref 65–99)
GLUCOSE-CAPILLARY: 130 mg/dL — AB (ref 65–99)
GLUCOSE-CAPILLARY: 375 mg/dL — AB (ref 65–99)
Glucose-Capillary: 467 mg/dL — ABNORMAL HIGH (ref 65–99)
Glucose-Capillary: 201 mg/dL — ABNORMAL HIGH (ref 65–99)
Glucose-Capillary: 133 mg/dL — ABNORMAL HIGH (ref 65–99)
Glucose-Capillary: 99 mg/dL (ref 65–99)
Glucose-Capillary: 206 mg/dL — ABNORMAL HIGH (ref 65–99)

## 2016-08-12 LAB — MRSA PCR SCREENING: MRSA by PCR: NEGATIVE

## 2016-08-12 LAB — PREGNANCY, URINE: Preg Test, Ur: NEGATIVE

## 2016-08-12 LAB — CBG MONITORING, ED
Glucose-Capillary: >600 mg/dL (ref 65–99)
Glucose-Capillary: >600 mg/dL (ref 65–99)

## 2016-08-12 LAB — TROPONIN I: Troponin I: <0.03 ng/mL (ref <0.03)

## 2016-08-12 MED ORDER — SODIUM CHLORIDE 0.9 % IV SOLN
INTRAVENOUS | Status: DC
  Administered 2016-08-12: 02:00:00 via INTRAVENOUS

## 2016-08-12 MED ORDER — SODIUM CHLORIDE 0.9 % IV SOLN
INTRAVENOUS | Status: DC
  Administered 2016-08-12 – 2016-08-13 (×3): via INTRAVENOUS

## 2016-08-12 MED ORDER — ASPIRIN EC 81 MG PO TBEC
81.0000 mg | DELAYED_RELEASE_TABLET | Freq: Every day | ORAL | Status: DC
  Administered 2016-08-12 – 2016-08-14 (×3): 81 mg via ORAL
  Filled 2016-08-12 (×3): qty 1

## 2016-08-12 MED ORDER — DEXTROSE-NACL 5-0.45 % IV SOLN
INTRAVENOUS | Status: DC
  Administered 2016-08-12: 08:00:00 via INTRAVENOUS

## 2016-08-12 MED ORDER — SODIUM CHLORIDE 0.9 % IV BOLUS (SEPSIS)
500.0000 mL | Freq: Once | INTRAVENOUS | Status: AC
  Administered 2016-08-12: 500 mL via INTRAVENOUS

## 2016-08-12 MED ORDER — SODIUM CHLORIDE 0.9 % IV SOLN
INTRAVENOUS | Status: DC
  Filled 2016-08-12: qty 2.5

## 2016-08-12 MED ORDER — MYCOPHENOLATE MOFETIL 250 MG PO CAPS
500.0000 mg | ORAL_CAPSULE | Freq: Two times a day (BID) | ORAL | Status: DC
  Administered 2016-08-12 – 2016-08-14 (×5): 500 mg via ORAL
  Filled 2016-08-12 (×5): qty 2

## 2016-08-12 MED ORDER — PHENOL 1.4 % MT LIQD
1.0000 | OROMUCOSAL | Status: DC | PRN
  Administered 2016-08-12: 1 via OROMUCOSAL
  Filled 2016-08-12: qty 177

## 2016-08-12 MED ORDER — SODIUM BICARBONATE 650 MG PO TABS
1300.0000 mg | ORAL_TABLET | Freq: Two times a day (BID) | ORAL | Status: DC
  Administered 2016-08-12: 1300 mg via ORAL
  Filled 2016-08-12: qty 2

## 2016-08-12 MED ORDER — INSULIN DETEMIR 100 UNIT/ML ~~LOC~~ SOLN: 17.0000 [IU] | Freq: Two times a day (BID) | SUBCUTANEOUS | Status: DC

## 2016-08-12 MED ORDER — HEPARIN SODIUM (PORCINE) 5000 UNIT/ML IJ SOLN
5000.0000 [IU] | Freq: Three times a day (TID) | INTRAMUSCULAR | Status: DC
  Administered 2016-08-12 – 2016-08-14 (×7): 5000 [IU] via SUBCUTANEOUS
  Filled 2016-08-12 (×6): qty 1

## 2016-08-12 MED ORDER — TACROLIMUS 1 MG PO CAPS
1.0000 mg | ORAL_CAPSULE | Freq: Every day | ORAL | Status: DC
  Administered 2016-08-12 – 2016-08-14 (×3): 1 mg via ORAL
  Filled 2016-08-12 (×3): qty 1

## 2016-08-12 MED ORDER — INSULIN ASPART 100 UNIT/ML ~~LOC~~ SOLN
3.0000 [IU] | Freq: Three times a day (TID) | SUBCUTANEOUS | Status: DC
  Administered 2016-08-12 (×2): 3 [IU] via SUBCUTANEOUS

## 2016-08-12 MED ORDER — CHLORHEXIDINE GLUCONATE CLOTH 2 % EX PADS: 6.0000 | MEDICATED_PAD | Freq: Every day | CUTANEOUS | Status: DC

## 2016-08-12 MED ORDER — HYDROCODONE-ACETAMINOPHEN 5-325 MG PO TABS
1.0000 | ORAL_TABLET | Freq: Four times a day (QID) | ORAL | Status: DC | PRN
  Administered 2016-08-12 – 2016-08-13 (×2): 1 via ORAL
  Administered 2016-08-14: 2 via ORAL
  Filled 2016-08-12: qty 1
  Filled 2016-08-12: qty 2
  Filled 2016-08-12: qty 1

## 2016-08-12 MED ORDER — INSULIN DETEMIR 100 UNIT/ML ~~LOC~~ SOLN
17.0000 [IU] | Freq: Two times a day (BID) | SUBCUTANEOUS | Status: DC
  Administered 2016-08-12 (×2): 17 [IU] via SUBCUTANEOUS
  Filled 2016-08-12 (×3): qty 0.17

## 2016-08-12 MED ORDER — PREDNISONE 5 MG PO TABS
5.0000 mg | ORAL_TABLET | Freq: Every day | ORAL | Status: DC
  Administered 2016-08-12 – 2016-08-14 (×3): 5 mg via ORAL
  Filled 2016-08-12 (×3): qty 1

## 2016-08-12 MED ORDER — CIPROFLOXACIN HCL 0.3 % OP SOLN
1.0000 [drp] | OPHTHALMIC | Status: DC
  Administered 2016-08-12 – 2016-08-14 (×15): 1 [drp] via OTIC
  Filled 2016-08-12: qty 2.5

## 2016-08-12 MED ORDER — INSULIN DETEMIR 100 UNIT/ML ~~LOC~~ SOLN
25.0000 [IU] | Freq: Two times a day (BID) | SUBCUTANEOUS | Status: DC
  Filled 2016-08-12 (×2): qty 0.25

## 2016-08-12 MED ORDER — TACROLIMUS 0.5 MG PO CAPS
0.5000 mg | ORAL_CAPSULE | Freq: Every day | ORAL | Status: DC
  Administered 2016-08-13: 0.5 mg via ORAL
  Filled 2016-08-12: qty 1

## 2016-08-12 MED ORDER — MUPIROCIN 2 % EX OINT
1.0000 "application " | TOPICAL_OINTMENT | Freq: Two times a day (BID) | CUTANEOUS | Status: DC
  Administered 2016-08-12: 1 via NASAL
  Filled 2016-08-12: qty 22

## 2016-08-12 MED ORDER — INSULIN ASPART 100 UNIT/ML ~~LOC~~ SOLN
0.0000 [IU] | Freq: Three times a day (TID) | SUBCUTANEOUS | Status: DC
  Administered 2016-08-12: 3 [IU] via SUBCUTANEOUS
  Administered 2016-08-12: 1 [IU] via SUBCUTANEOUS
  Administered 2016-08-13 (×2): 7 [IU] via SUBCUTANEOUS
  Administered 2016-08-13 – 2016-08-14 (×3): 3 [IU] via SUBCUTANEOUS

## 2016-08-12 MED ORDER — TACROLIMUS 0.5 MG PO CAPS
0.5000 mg | ORAL_CAPSULE | Freq: Every day | ORAL | Status: DC
  Filled 2016-08-12 (×2): qty 1

## 2016-08-12 MED ORDER — ACETAMINOPHEN 325 MG PO TABS
650.0000 mg | ORAL_TABLET | Freq: Four times a day (QID) | ORAL | Status: DC | PRN
  Administered 2016-08-12: 650 mg via ORAL
  Filled 2016-08-12: qty 2

## 2016-08-12 MED ORDER — LEVOTHYROXINE SODIUM 25 MCG PO TABS
137.0000 ug | ORAL_TABLET | Freq: Every day | ORAL | Status: DC
  Administered 2016-08-12 – 2016-08-14 (×3): 137 ug via ORAL
  Filled 2016-08-12 (×5): qty 1

## 2016-08-12 MED ORDER — SODIUM CHLORIDE 0.9 % IV SOLN
INTRAVENOUS | Status: AC
  Administered 2016-08-12: via INTRAVENOUS

## 2016-08-12 MED ORDER — TACROLIMUS 1 MG PO CAPS
1.0000 mg | ORAL_CAPSULE | Freq: Every day | ORAL | Status: DC
  Filled 2016-08-12: qty 1

## 2016-08-12 NOTE — Consult Note (Signed)
WOC Nurse wound consult note Reason for Consult: Right posterior leg wound Wound type:stasis ulcer caused from injury Pressure Ulcer POA: No Measurement: 1.5cm x 1cm x 0.1cm Wound bed: 50% granulating dispersed with 50% yellow Drainage (amount, consistency, odor) moderate, no odor  Periwound:intact Dressing procedure/placement/frequency: I have provided nurses with orders for cleanse wound with NS, pat dry, apply aquacel Ag+, cover with foam, change daily. Pt also has MASD in bilateral groins. Ordered InterDry Ag+ with instructions. We will not follow, but will remain available to this patient, to nursing, and the medical and/or surgical teams.  Please re-consult if we need to assist further.    Barnett HatterMelinda Maelee Hoot, RN-C, WTA-C Wound Treatment Associate

## 2016-08-12 NOTE — Progress Notes (Signed)
PROGRESS NOTE    [name] Parodyamela S Isadore  RUE:[id number]:[id number] DOB: [birth year] DOA: 08/11/2016 PCP: Dois DavenportICHTER,KAREN L., MD   Brief Narrative: [name] is a 50 y.o. female with medical history significant for insulin-dependent diabetes mellitus with frequent DKA, history of renal transplant, CKD stage III, hypertension, and hypothyroidism who presents to the emergency department with high CBG and 1 day of malaise. Patient reports that she had been in her usual state of health until the insidious development of a general malaise last night. She reports that the initial symptoms involved mild and generalized aches and discomfort. Symptoms worsened today, became accompanied by nausea and 1 episode of nonbloody nonbilious vomiting, and the patient noted CBG to be in the 400s early in the day. She reports taking her dose of long-acting insulin on the morning of presentation, but states that she did not take any other insulin throughout the day as she was not eating. With her symptoms worsening, her husband took her to a local fire department where her CBG was reportedly "high." EMS was activated for transport to the hospital and CBG was reportedly ">600" and she was hypotensive to 76/40. She was given 1 L of normal saline en route to the hospital with improvement in her pressures. Patient denies any recent fevers or chills and denies cough or dyspnea. She also denies chest pain or palpitations. She endorses polyuria, but denies dysuria or suprapubic tenderness.   Assessment & Plan:   Principal Problem:   Diabetic ketoacidosis without coma associated with type 2 diabetes mellitus (HCC) Active Problems:   Hypothyroidism   Hyperkalemia   H/O kidney transplant   Hypotension   Essential hypertension   GERD (gastroesophageal reflux disease)   Acute renal failure superimposed on stage 3 chronic kidney disease (HCC)   DKA (diabetic ketoacidoses) (HCC)   1-DKA, type II DM  Presents with anion gap metabolic  acidosis.  Was treated with insulin Gtt. She has been transition to levemir, gap close.  Repeat Bmet this afternoon.   2-Hyperkalemia; in setting DKA and AKI.  Resolved.   3-AKI; on CKD stage III; Kidney transplant.  SCr is 2.28 on admission, up. Cr baseline 1.5.  Improving with correction of DKA and IV fluids.  Repeat labs in am.  If renal function gets worse might need to consult nephrology.  Continue with immune suppressive therapy.   4-Status-post renal transplantation - Continue Prograf, Cellcept, prednisone     5-GERD - No EGD report on file  - Managed with Zantac at home, will continue H2-blocker     6-Hypothyroidism  - Continue Synthroid   7-Left calf wound, present on arrival - Does not appear infected on admission  - Wound care consultation requested for recommendations    8-Hypotension;  improved with IV fluids.  Hold BP medication.   9-Hyponatremia; Pseudohyponatremia due to hyperglycemia;  Improving.   DVT prophylaxis: Heparin  Code Status:full code. Family Communication: care discussed with patient.  Disposition Plan: home in 24 to 48 hours.  Consultants:   none   Procedures: none   Antimicrobials: none   Subjective: She is feeling better, report mild headaches. Still feeling weak.   Objective: Vitals:   08/12/16 0006 08/12/16 0045 08/12/16 0145 08/12/16 0731  BP:  (!) 96/46 (!) 94/44 (!) 118/49  Pulse:  88 91 88  Resp:  21 15 17   Temp: 97.7 F (36.5 C)   98 F (36.7 C)  TempSrc: Oral   Oral  SpO2:  99% 100% 98%  Weight:  Height:        Intake/Output Summary (Last 24 hours) at 08/12/16 0810 Last data filed at 08/12/16 0659  Gross per 24 hour  Intake          2829.58 ml  Output               50 ml  Net          2779.58 ml   Filed Weights   08/11/16 2238  Weight: 89.4 kg (197 lb)    Examination:  General exam: Appears calm and comfortable  Respiratory system: Clear to auscultation. Respiratory effort  normal. Cardiovascular system: S1 & S2 heard, RRR. No JVD, murmurs, rubs, gallops or clicks. No pedal edema. Gastrointestinal system: Abdomen is nondistended, soft and nontender. No organomegaly or masses felt. Normal bowel sounds heard. Central nervous system: Alert and oriented. No focal neurological deficits. Extremities: Symmetric 5 x 5 power. Skin: No rashes, lesions or ulcers Psychiatry: Judgement and insight appear normal. Mood & affect appropriate.     Data Reviewed: I have personally reviewed following labs and imaging studies  CBC:  Recent Labs Lab 08/11/16 2252 08/11/16 2303  WBC 6.9  --   NEUTROABS 5.5  --   HGB 11.3* 11.9*  HCT 35.8* 35.0*  MCV 90.2  --   PLT 203  --    Basic Metabolic Panel:  Recent Labs Lab 08/11/16 2252 08/11/16 2303 08/12/16 0254  NA 127* 126* 133*  K 5.5* 5.4* 4.2  CL 94* 97* 102  CO2 8*  --  11*  GLUCOSE 779* >700* 513*  BUN 51* 43* 49*  CREATININE 2.28* 1.70* 2.25*  CALCIUM 9.3  --  8.8*   GFR: Estimated Creatinine Clearance: 34.7 mL/min (by C-G formula based on SCr of 2.25 mg/dL (H)). Liver Function Tests:  Recent Labs Lab 08/11/16 2252  AST 13*  ALT 14  ALKPHOS 119  BILITOT 1.7*  PROT 6.1*  ALBUMIN 2.7*   No results for input(s): LIPASE, AMYLASE in the last 168 hours. No results for input(s): AMMONIA in the last 168 hours. Coagulation Profile: No results for input(s): INR, PROTIME in the last 168 hours. Cardiac Enzymes:  Recent Labs Lab 08/12/16 0254  TROPONINI <0.03   BNP (last 3 results) No results for input(s): PROBNP in the last 8760 hours. HbA1C: No results for input(s): HGBA1C in the last 72 hours. CBG:  Recent Labs Lab 08/12/16 0327 08/12/16 0433 08/12/16 0530 08/12/16 0629 08/12/16 0743  GLUCAP 467* 365* 335* 294* 201*   Lipid Profile: No results for input(s): CHOL, HDL, LDLCALC, TRIG, CHOLHDL, LDLDIRECT in the last 72 hours. Thyroid Function Tests: No results for input(s): TSH, T4TOTAL,  FREET4, T3FREE, THYROIDAB in the last 72 hours. Anemia Panel: No results for input(s): VITAMINB12, FOLATE, FERRITIN, TIBC, IRON, RETICCTPCT in the last 72 hours. Sepsis Labs: No results for input(s): PROCALCITON, LATICACIDVEN in the last 168 hours.  Recent Results (from the past 240 hour(s))  MRSA PCR Screening     Status: None   Collection Time: 08/12/16  2:44 AM  Result Value Ref Range Status   MRSA by PCR NEGATIVE NEGATIVE Final    Comment:        The GeneXpert MRSA Assay (FDA approved for NASAL specimens only), is one component of a comprehensive MRSA colonization surveillance program. It is not intended to diagnose MRSA infection nor to guide or monitor treatment for MRSA infections.          Radiology Studies: Dg Chest 2 View  Result Date: 08/12/2016 CLINICAL DATA:  Diabetic ketoacidosis. EXAM: CHEST  2 VIEW COMPARISON:  Portable exam 04/15/2016 FINDINGS: The cardiomediastinal contours are normal. Mild chronic central bronchitic change. Pulmonary vasculature is normal. No consolidation, pleural effusion, or pneumothorax. No acute osseous abnormalities are seen. IMPRESSION: No acute abnormality. Electronically Signed   By: Rubye OaksMelanie  Ehinger M.D.   On: 08/12/2016 01:05        Scheduled Meds: . aspirin EC  81 mg Oral Daily  . heparin  5,000 Units Subcutaneous Q8H  . levothyroxine  137 mcg Oral QAC breakfast  . mycophenolate  500 mg Oral BID  . predniSONE  5 mg Oral Daily  . tacrolimus  0.5 mg Oral Daily  . tacrolimus  1 mg Oral QHS   Continuous Infusions: . sodium chloride 125 mL/hr at 08/12/16 0216  . dextrose 5 % and 0.45% NaCl 90 mL/hr at 08/12/16 0745  . insulin (NOVOLIN-R) infusion 10.8 Units/hr (08/12/16 0114)     LOS: 0 days    Time spent: 35 minutes.     Alba Coryegalado, Zaydn Gutridge A, MD [facility] Pager [phone]  If 7PM-7AM, please contact night-coverage [email or web address] Password [email or web address] 08/12/2016, 8:10 AM

## 2016-08-12 NOTE — Progress Notes (Signed)
Noted that patient was admitted with DKA. Patient takes Novolin 70/30 insulin 30 units BID at home.  (this would be equal to approximately 34 units of Lantus or Levemir daily) Spoke with staff RN about patient transition off of drip. Basal insulin to be given 1-2 hours prior to stopping drip. Will need Novolog  correction scale in place when patient comes off drip. Recommend Novolog SENSITIVE correction scale TID & HS if eating and every 4 hours if NPO. Will follow blood sugars while in the hospital. Smith MinceKendra Carvell Hoeffner RN BSN CDE

## 2016-08-12 NOTE — H&P (Signed)
History and Physical    [name] AXK:[id number] DOB: [birth year] DOA: 08/11/2016  PCP: Hayden Rasmussen., MD   Patient coming from: Home  Chief Complaint: Malaise, polyuria, high blood glucose   HPI: [name] is a 50 y.o. female with medical history significant for insulin-dependent diabetes mellitus with frequent DKA, history of renal transplant, CKD stage III, hypertension, and hypothyroidism who presents to the emergency department with high CBG and 1 day of malaise. Patient reports that she had been in her usual state of health until the insidious development of a general malaise last night. She reports that the initial symptoms involved mild and generalized aches and discomfort. Symptoms worsened today, became accompanied by nausea and 1 episode of nonbloody nonbilious vomiting, and the patient noted CBG to be in the 400s early in the day. She reports taking her dose of long-acting insulin on the morning of presentation, but states that she did not take any other insulin throughout the day as she was not eating. With her symptoms worsening, her husband took her to a local fire department where her CBG was reportedly "high." EMS was activated for transport to the hospital and CBG was reportedly ">600" and she was hypotensive to 76/40. She was given 1 L of normal saline en route to the hospital with improvement in her pressures. Patient denies any recent fevers or chills and denies cough or dyspnea. She also denies chest pain or palpitations. She endorses polyuria, but denies dysuria or suprapubic tenderness.  ED Course: Upon arrival to the ED, patient is found to be afebrile, saturating well on room air, with BP in the 95/50 range, and vitals otherwise stable. Chest x-ray is negative for acute cardiopulmonary disease and EKG is pending. Chemistry panel features widespread derangements including sodium of 127, potassium 5.5, bicarbonate of 8, creatinine of 2.28 (apparent baseline 1.1),  anion gap 25, and glucose 779. CBC features a stable normocytic anemia with hemoglobin of 11.3. Patient was given an additional 1 L of normal saline as a bolus and started on insulin infusion in the ED. Blood pressure is remained soft but stable and the patient has not been in any respiratory distress. She will be admitted to the stepdown unit for ongoing evaluation and management of DKA with uncertain precipitant.  Review of Systems:  All other systems reviewed and apart from HPI, are negative.  Past Medical History:  Diagnosis Date  . Arthritis    "elbows, knees, legs, back" (09/24/2015)  . CKD (chronic kidney disease)   . Daily headache   . Depression    "years ago"  . End stage renal disease (HCC)    right arm AV graft, post transplant  . Hypertension   . Hypothyroid   . 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 Kidney disease   . Pneumonia ~ 2007?  [name] Type II diabetes mellitus (HCC)    Insulin dependant    Past Surgical History:  Procedure Laterality Date  . AMPUTATION Left 05/11/2014   Procedure: AMPUTATION LEFT GREAT TOE;  Surgeon: Mcarthur Rossetti, MD;  Location: [facility];  Service: Orthopedics;  Laterality: Left;  . AV FISTULA PLACEMENT Right    forearm  . BACK SURGERY    . CATARACT EXTRACTION W/ INTRAOCULAR LENS  IMPLANT, BILATERAL Bilateral   . CESAREAN SECTION  07/1999  . DG AV DIALYSIS GRAFT DECLOT OR    . INCISION AND DRAINAGE Right 06/02/2015   Procedure: INCISION AND DRAINAGE OF RIGHT 3rd RAY  RESECTION;  Surgeon: Mcarthur Rossetti, MD;  Location: [place];  Service: Orthopedics;  Laterality: Right;  . 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 [place] SURGERY  2001  . NEPHRECTOMY TRANSPLANTED ORGAN    . TOE AMPUTATION Right    1,2 & 3rd toes.  . TUBAL LIGATION  07/1999     reports that she quit smoking about 17 years ago. Her smoking use included Cigarettes. She has a 11.00 pack-year smoking history. She has never used smokeless  tobacco. She reports that she does not drink alcohol or use drugs.  Allergies  Allergen Reactions  . Ace Inhibitors Cough  . Adhesive [Tape] Itching and Other (See Comments)    Please use paper tape  . Lisinopril Cough    Family History  Problem Relation Age of Onset  . Emphysema Mother   . Throat cancer Mother   . COPD Mother   . Cancer Mother   . Emphysema Father   . COPD Father   . Stroke Father   . ADD / ADHD Son      Prior to Admission medications   Medication Sig Start Date End Date Taking? Authorizing Provider  acetaminophen (TYLENOL) 325 MG tablet Take 2 tablets (650 mg total) by mouth every 6 (six) hours as needed for mild pain, moderate pain, fever or headache. 05/15/16   Modena Jansky, MD  amLODipine (NORVASC) 5 MG tablet Take 5 mg by mouth daily.    Historical Provider, MD  aspirin EC 81 MG tablet Take 81 mg by mouth daily.    Historical Provider, MD  carvedilol (COREG) 6.25 MG tablet Take 1 tablet (6.25 mg total) by mouth 2 (two) times daily. 06/30/16   Eugenie Filler, MD  doxycycline (VIBRAMYCIN) 100 MG capsule Take 1 capsule (100 mg total) by mouth 2 (two) times daily. 07/14/16   Nat Christen, MD  HYDROcodone-acetaminophen (NORCO/VICODIN) 5-325 MG tablet Take 1-2 tablets by mouth every 6 (six) hours as needed for severe pain or moderate pain. 06/30/16   Eugenie Filler, MD  insulin aspart (NOVOLOG) 100 UNIT/ML injection Before each meal 3 times a day, 140-199 - 2 units, 200-250 - 4 units, 251-299 - 6 units,  300-349 - 8 units,  350 or above 10 units. Insulin PEN if approved, provide syringes and needles if needed. 04/18/16   Florencia Reasons, MD  Insulin Detemir (LEVEMIR) 100 UNIT/ML Pen Inject 25 Units into the skin 2 (two) times daily. 07/23/16   Doreatha Lew, MD  levothyroxine (SYNTHROID, LEVOTHROID) 137 MCG tablet Take 1 tablet (137 mcg total) by mouth daily. BRAND NAME ONLY 03/04/15   Robbie Lis, MD  mycophenolate (CELLCEPT) 250 MG capsule Take 500 mg by mouth  2 (two) times daily.     Historical Provider, MD  predniSONE (DELTASONE) 5 MG tablet Take 5 mg by mouth daily. 05/29/15   Historical Provider, MD  ranitidine (ZANTAC) 150 MG tablet Take 150 mg by mouth daily. 03/27/16   Historical Provider, MD  SSD 1 % cream Apply 1 application topically daily.  06/10/16   Historical Provider, MD  tacrolimus (PROGRAF) 0.5 MG capsule Take 0.5 mg by mouth every morning.     Historical Provider, MD  tacrolimus (PROGRAF) 1 MG capsule Take 1 mg by mouth at bedtime.    Historical Provider, MD    Physical Exam: Vitals:   08/11/16 2351 08/12/16 0000 08/12/16 0006 08/12/16 0045  BP:  97/57  (!) 96/46  Pulse:  85  88  Resp:  15  21  Temp: 97.6 F (36.4 C)  97.7 F (36.5 C)   TempSrc: Oral  Oral   SpO2:  99%  99%  Weight:      Height:          Constitutional: NAD, calm, in apparent discomfort  Eyes: PERTLA, lids and conjunctivae normal ENMT: Mucous membranes are dry. Posterior pharynx clear of any exudate or lesions.   Neck: normal, supple, no masses, no thyromegaly Respiratory: clear to auscultation bilaterally, no wheezing, no crackles. Normal respiratory effort.   Cardiovascular: S1 & S2 heard, regular rate and rhythm. No extremity edema. No significant JVD. Abdomen: No distension, no tenderness, no masses palpated. Bowel sounds normal.  Musculoskeletal: no clubbing / cyanosis. No joint deformity upper and lower extremities.    Skin: Scattered superficial excoriations about the b/l UEs and neck. Posterior left calf wound with granulation tissue and proteinaceous exudate, but no drainage or surrounding erythema, heat, or tenderness.   Neurologic: CN 2-12 grossly intact. Sensation intact, DTR normal. Strength 5/5 in all 4 limbs.  Psychiatric: Normal judgment and insight. Alert and oriented x 3. Normal mood and affect.     Labs on Admission: I have personally reviewed following labs and imaging studies  CBC:  Recent Labs Lab 08/11/16 2252  08/11/16 2303  WBC 6.9  --   NEUTROABS 5.5  --   HGB 11.3* 11.9*  HCT 35.8* 35.0*  MCV 90.2  --   PLT 203  --    Basic Metabolic Panel:  Recent Labs Lab 08/11/16 2252 08/11/16 2303  NA 127* 126*  K 5.5* 5.4*  CL 94* 97*  CO2 8*  --   GLUCOSE 779* >700*  BUN 51* 43*  CREATININE 2.28* 1.70*  CALCIUM 9.3  --    GFR: Estimated Creatinine Clearance: 45.9 mL/min (by C-G formula based on SCr of 1.7 mg/dL (H)). Liver Function Tests:  Recent Labs Lab 08/11/16 2252  AST 13*  ALT 14  ALKPHOS 119  BILITOT 1.7*  PROT 6.1*  ALBUMIN 2.7*   No results for input(s): LIPASE, AMYLASE in the last 168 hours. No results for input(s): AMMONIA in the last 168 hours. Coagulation Profile: No results for input(s): INR, PROTIME in the last 168 hours. Cardiac Enzymes: No results for input(s): CKTOTAL, CKMB, CKMBINDEX, TROPONINI in the last 168 hours. BNP (last 3 results) No results for input(s): PROBNP in the last 8760 hours. HbA1C: No results for input(s): HGBA1C in the last 72 hours. CBG:  Recent Labs Lab 08/11/16 2230 08/12/16 0006  GLUCAP >600* >600*   Lipid Profile: No results for input(s): CHOL, HDL, LDLCALC, TRIG, CHOLHDL, LDLDIRECT in the last 72 hours. Thyroid Function Tests: No results for input(s): TSH, T4TOTAL, FREET4, T3FREE, THYROIDAB in the last 72 hours. Anemia Panel: No results for input(s): VITAMINB12, FOLATE, FERRITIN, TIBC, IRON, RETICCTPCT in the last 72 hours. Urine analysis:    Component Value Date/Time   COLORURINE YELLOW 07/21/2016 1905   APPEARANCEUR CLEAR 07/21/2016 1905   LABSPEC 1.024 07/21/2016 1905   PHURINE 5.5 07/21/2016 1905   GLUCOSEU >1000 (A) 07/21/2016 1905   HGBUR NEGATIVE 07/21/2016 1905   BILIRUBINUR NEGATIVE 07/21/2016 1905   KETONESUR 40 (A) 07/21/2016 1905   PROTEINUR 100 (A) 07/21/2016 1905   UROBILINOGEN 0.2 03/02/2015 1153   NITRITE NEGATIVE 07/21/2016 1905   LEUKOCYTESUR NEGATIVE 07/21/2016 1905   Sepsis  Labs: _0 (procalcitonin:4,lacticidven:4) )No results found for this or any previous visit (from the past 240 hour(s)).   Radiological Exams  on Admission: No results found.  EKG: Ordered and pending.   Assessment/Plan  1. DKA, type II DM  - Presents with malaise, serum glucose 779, acidosis; UA ordered and pending  - Uncertain precipitant; she has reported hx of poor adherence with her insulin and A1c was 10.3% in September 2017; there is no fever on admission, but pt is immunosuppressed and CXR, UA, and cultures will be obtained - No chest pain to suggest MI as etiology, and no known hx of CAD  - She will be fluid-resuscitated with NS, continued on insulin infusion until parameters met for transition to sq; monitor with telemetry, serial chem panels, q1h CBG   2. Hyperkalemia  - Potassium 5.5 on admission in setting of DKA and AKI superimposed on CKD III  - Anticipate resolution with insulin and IVF  - Following serial chem panels and monitoring on telemetry   3. Hypotension, hx of HTN  - BP was reportedly 76/40 upon arrival of EMS and she was treated with a 1 L NS bolus en route  - BP has remained soft but stable in ED, where she received an additional 1 L NS bolus - Suspected secondary to dehydration in setting of DKA, exacerbated by her reported compliance with home antihypertensives - Acute MI unlikely, but considered given the uncertain etiology for her DKA; will check EKG and a single troponin, and if no evidence for ischemia, believe this will be essentially excluded   - No evidence thus far of any infectious process; UA pending and cultures requested  - Monitor in stepdown unit and continue IVF with NS  - Home antihypertensives, Norvasc and Coreg, will be held   4. AKI superimposed on CKD stage III - SCr is 2.28 on admission, up from an apparent baseline of ~1.1  - Likely a prerenal azotemia in setting of DKA with resulting dehydration and hypotension  - Anticipate  improvement with fluid-resuscitation  - Avoid nephrotoxins, follow serial chem panel   5. Status-post renal transplantation - Continue Prograf, Cellcept, prednisone    6. GERD - No EGD report on file  - Managed with Zantac at home, will continue H2-blocker    7. Hypothyroidism  - Continue Synthroid   8. Left calf wound, present on arrival - Does not appear infected on admission  - Wound care consultation requested for recommendations     DVT prophylaxis: sq heparin  Code Status: Full  Family Communication: Discussed with patient Disposition Plan: Admit to stepdown Consults called: None Admission status: Inpatient    Vianne Bulls, MD [facility] Pager [phone]  If 7PM-7AM, please contact night-coverage [email or web address] Password [email or web address]  08/12/2016, 12:58 AM

## 2016-08-12 NOTE — Progress Notes (Signed)
Nutrition Brief Note  Patient identified on the Malnutrition Screening Tool (MST) Report.  Wt Readings from Last 15 Encounters:  08/11/16 197 lb (89.4 kg)  07/21/16 197 lb 1.5 oz (89.4 kg)  06/30/16 196 lb 3.4 oz (89 kg)  05/13/16 192 lb 7.4 oz (87.3 kg)  04/11/16 230 lb (104.3 kg)  03/27/16 230 lb (104.3 kg)  03/01/16 225 lb (102.1 kg)  11/05/15 220 lb 7.4 oz (100 kg)  09/26/15 223 lb (101.2 kg)  06/02/15 213 lb 3 oz (96.7 kg)  05/23/15 225 lb (102.1 kg)  04/20/15 216 lb (98 kg)  03/02/15 214 lb 11.7 oz (97.4 kg)  01/02/15 226 lb 13.7 oz (102.9 kg)  10/29/14 215 lb 3.2 oz (97.6 kg)    Body mass index is 30.85 kg/m. Patient meets criteria for class 1 obesity based on current BMI.   Patient with some weight loss over the past 4 months. She reports good appetite and is unsure why she has lost weight. Nutrition-Focused physical exam completed. Findings are no fat depletion, no muscle depletion, and no edema.   Current diet order is CHO modified, patient is consuming approximately 75-100% of meals at this time. Labs and medications reviewed.   No nutrition interventions warranted at this time. If nutrition issues arise, please consult RD.   Joaquin CourtsKimberly Harris, RD, LDN, CNSC Pager [phone] After Hours Pager [phone]

## 2016-08-12 NOTE — Consult Note (Signed)
   [name]   08/12/2016  [name] Jul 30, 1966 [id number]    Made aware of hospital admission by [facility]. Chart reviewed. Telephonic [facility] RN[facility] has been trying to reach [name] to engage for [facility] services prior to admission. Please see chart review then notes for patient outreach attempts.   Went to bedside to speak with [name] about [facility] services for DM management. Discussed how [facility] Telephonic RNCM has been attempting to reach her but has been unable to. She states she does not recall. She also states " I forget things sometimes". Confirmed best contact number as [phone]. [name] is agreeable only to telephonic calls and not home visits by [facility]. States " I do not want anyone coming to my house".  Confirms that she lives with her husband. Endorses she takes her medications like she is supposed to. [facility] [place]. Her husband takes her to her MD appointments. Confirms her Primary Care MD is Dr. Hal Hopeichter.   Written consent obtained and [facility] packet, contact information and 24-hr nurse line magnet. Will make inpatient RNCM aware and will request to be assigned to [facility] RNCM for follow up. Of note, she declines home visits and is only agreeable to post discharge calls. Has had x6 hospitalizations in past 6 months. History of DM, CKD, HTN.   Raiford NobleAtika Nemiah Kissner, MSN-Ed, RN,BSN [facility] Care Management Hospital Liaison [phone]

## 2016-08-13 LAB — BASIC METABOLIC PANEL
Anion gap: 11 (ref 5–15)
BUN: 42 mg/dL — ABNORMAL HIGH (ref 6–20)
CALCIUM: 9.3 mg/dL (ref 8.9–10.3)
CO2: 17 mmol/L — ABNORMAL LOW (ref 22–32)
CREATININE: 1.63 mg/dL — AB (ref 0.44–1.00)
Chloride: 103 mmol/L (ref 101–111)
GFR, EST AFRICAN AMERICAN: 42 mL/min — AB (ref >60)
GFR, EST NON AFRICAN AMERICAN: 36 mL/min — AB (ref >60)
Glucose, Bld: 358 mg/dL — ABNORMAL HIGH (ref 65–99)
Potassium: 4.5 mmol/L (ref 3.5–5.1)
SODIUM: 131 mmol/L — AB (ref 135–145)

## 2016-08-13 LAB — GLUCOSE, CAPILLARY
GLUCOSE-CAPILLARY: 311 mg/dL — AB (ref 65–99)
GLUCOSE-CAPILLARY: 329 mg/dL — AB (ref 65–99)
Glucose-Capillary: 225 mg/dL — ABNORMAL HIGH (ref 65–99)
Glucose-Capillary: 297 mg/dL — ABNORMAL HIGH (ref 65–99)

## 2016-08-13 LAB — URINE CULTURE

## 2016-08-13 LAB — CBC
HCT: 33.2 % — ABNORMAL LOW (ref 36.0–46.0)
HEMOGLOBIN: 10.7 g/dL — AB (ref 12.0–15.0)
MCH: 27.7 pg (ref 26.0–34.0)
MCHC: 32.2 g/dL (ref 30.0–36.0)
MCV: 86 fL (ref 78.0–100.0)
Platelets: 165 10*3/uL (ref 150–400)
RBC: 3.86 MIL/uL — ABNORMAL LOW (ref 3.87–5.11)
RDW: 13.8 % (ref 11.5–15.5)
WBC: 5 10*3/uL (ref 4.0–10.5)

## 2016-08-13 LAB — HEMOGLOBIN A1C
Hgb A1c MFr Bld: 12.5 % — ABNORMAL HIGH (ref 4.8–5.6)
Mean Plasma Glucose: 312 mg/dL

## 2016-08-13 MED ORDER — SODIUM BICARBONATE 650 MG PO TABS
1300.0000 mg | ORAL_TABLET | Freq: Three times a day (TID) | ORAL | Status: DC
  Administered 2016-08-13 – 2016-08-14 (×4): 1300 mg via ORAL
  Filled 2016-08-13 (×4): qty 2

## 2016-08-13 MED ORDER — INSULIN ASPART 100 UNIT/ML ~~LOC~~ SOLN
4.0000 [IU] | Freq: Three times a day (TID) | SUBCUTANEOUS | Status: DC
  Administered 2016-08-13 – 2016-08-14 (×5): 4 [IU] via SUBCUTANEOUS

## 2016-08-13 MED ORDER — INSULIN ASPART 100 UNIT/ML ~~LOC~~ SOLN: 5.0000 [IU] | Freq: Three times a day (TID) | SUBCUTANEOUS | Status: DC

## 2016-08-13 MED ORDER — AMOXICILLIN-POT CLAVULANATE 500-125 MG PO TABS
1.0000 | ORAL_TABLET | Freq: Two times a day (BID) | ORAL | Status: DC
  Administered 2016-08-13 – 2016-08-14 (×3): 500 mg via ORAL
  Filled 2016-08-13 (×4): qty 1

## 2016-08-13 MED ORDER — FLUTICASONE PROPIONATE 50 MCG/ACT NA SUSP
1.0000 | Freq: Every day | NASAL | Status: DC
  Administered 2016-08-13 – 2016-08-14 (×2): 1 via NASAL
  Filled 2016-08-13: qty 16

## 2016-08-13 MED ORDER — INSULIN DETEMIR 100 UNIT/ML ~~LOC~~ SOLN
20.0000 [IU] | Freq: Two times a day (BID) | SUBCUTANEOUS | Status: DC
  Filled 2016-08-13 (×2): qty 0.2

## 2016-08-13 MED ORDER — HYDRALAZINE HCL 20 MG/ML IJ SOLN
10.0000 mg | Freq: Four times a day (QID) | INTRAMUSCULAR | Status: DC | PRN
  Administered 2016-08-13: 10 mg via INTRAVENOUS
  Filled 2016-08-13: qty 1

## 2016-08-13 MED ORDER — AMLODIPINE BESYLATE 5 MG PO TABS
5.0000 mg | ORAL_TABLET | Freq: Every day | ORAL | Status: DC
  Administered 2016-08-13: 5 mg via ORAL
  Filled 2016-08-13: qty 1

## 2016-08-13 MED ORDER — LORATADINE 10 MG PO TABS
10.0000 mg | ORAL_TABLET | Freq: Every day | ORAL | Status: DC
  Administered 2016-08-13 – 2016-08-14 (×2): 10 mg via ORAL
  Filled 2016-08-13 (×2): qty 1

## 2016-08-13 MED ORDER — INSULIN DETEMIR 100 UNIT/ML ~~LOC~~ SOLN
21.0000 [IU] | Freq: Two times a day (BID) | SUBCUTANEOUS | Status: DC
  Administered 2016-08-13 – 2016-08-14 (×3): 21 [IU] via SUBCUTANEOUS
  Filled 2016-08-13 (×4): qty 0.21

## 2016-08-13 MED ORDER — CARVEDILOL 6.25 MG PO TABS
6.2500 mg | ORAL_TABLET | Freq: Two times a day (BID) | ORAL | Status: DC
  Administered 2016-08-13 – 2016-08-14 (×3): 6.25 mg via ORAL
  Filled 2016-08-13 (×3): qty 1

## 2016-08-13 NOTE — Progress Notes (Signed)
Report given to nurse on 6East. Patient eating supper and stated she would like to wait until she's finished.  Leanna BattlesEckelmann, Alyssamae Klinck Eileen, RN.

## 2016-08-13 NOTE — Progress Notes (Signed)
Late Entry  Patient's BP has been increasing throughout the shift. Highest being 180/79, HR 73. Dr. Sunnie Nielsenegalado notified and new orders received.  Leanna BattlesEckelmann, Illyria Sobocinski Eileen, RN.

## 2016-08-13 NOTE — Progress Notes (Signed)
Inpatient Diabetes Program Recommendations  AACE/ADA: New Consensus Statement on Inpatient Glycemic Control (2015)  Target Ranges:  Prepandial:   less than 140 mg/dL      Peak postprandial:   less than 180 mg/dL (1-2 hours)      Critically ill patients:  140 - 180 mg/dL   Lab Results  Component Value Date   GLUCAP 375 (H) 08/12/2016   HGBA1C 12.5 (H) 08/12/2016    Review of Glycemic Control:  Results for [name], [name] (MRN [id number]) as of 08/13/2016 08:19  Ref. Range 08/12/2016 11:02 08/12/2016 12:03 08/12/2016 16:39 08/12/2016 20:52  Glucose-Capillary Latest Ref Range: 65 - 99 mg/dL 99 130130 (H) 865206 (H) 784375 (H)    Diabetes history: type 2 diabetes  Outpatient Diabetes medications: Novolin 70/30 30 units bid Current orders for Inpatient glycemic control:  Levemir 21 units bid, Novolog sensitive tid with meals and HS, Novolog 3 units tid with meals  Inpatient Diabetes Program Recommendations:    Please consider increasing Novolog meal coverage to 6 units tid with meals.  Upon discharge, could convert patient back to 70/30 regimen. May consider outpatient endocrinology consult as A1C indicates poor control of diabetes.   Thanks, Beryl MeagerJenny Laelyn Blumenthal, RN, BC-ADM Inpatient Diabetes Coordinator Pager [phone] (8a-5p)

## 2016-08-13 NOTE — Care Management Note (Addendum)
Case Management Note  Patient Details  Name: [name] MRN: [id number] Date of Birth: 19-Jan-1966  Subjective/Objective:    Patient lives with spouse, pta indep, per pt eval rec hhpt, NCM offered choice for HHPT ,patient states she does not want HHPT, NCM left [place] List in her Room with her. Patient states she has a rolling walker at home .  She states she would like to have a 3 n 1 to go over her commode, because it is so low.  She states [facility].  Will need order for this.  NCM asked patient why she keeps running out of strips she states she gets her insulin from [facility] 70/30 for 25.00 and she gets her strips for $9 for 50, her husband gets paid on a Monday.  NCM suggested maybe try to get 100 strips for 18.00 , patient states that she can do that.  she has a pcp, transportation and medication coverage. [facility] , since she had a kidney transplant.  NCM will cont to follow for dc needs.               Action/Plan:   Expected Discharge Date:  08/15/16               Expected Discharge Plan:  Home w Home Health Services  In-House Referral:     Discharge planning Services  CM Consult  Post Acute Care Choice:  Durable Medical Equipment, Home Health Choice offered to:  Patient  DME Arranged:  3-N-1 DME Agency:  [facility]  HH Arranged:  PT, [facility]used [facility] Agency:     Status of Service:  Completed, signed off  If discussed at Long Length of Stay Meetings, dates discussed:    Additional Comments:  Leone Havenaylor, Charlsey Moragne [place], RN 08/13/2016, 12:27 PM

## 2016-08-13 NOTE — Progress Notes (Addendum)
PROGRESS NOTE    [name]  ZOX:[id number] DOB: Jan 10, 1966 DOA: 08/11/2016 PCP: Dois Davenport., MD   Brief Narrative: [name] is a 50 y.o. female with medical history significant for insulin-dependent diabetes mellitus with frequent DKA, history of renal transplant, CKD stage III, hypertension, and hypothyroidism who presents to the emergency department with high CBG and 1 day of malaise. Patient reports that she had been in her usual state of health until the insidious development of a general malaise last night. She reports that the initial symptoms involved mild and generalized aches and discomfort. Symptoms worsened today, became accompanied by nausea and 1 episode of nonbloody nonbilious vomiting, and the patient noted CBG to be in the 400s early in the day. She reports taking her dose of long-acting insulin on the morning of presentation, but states that she did not take any other insulin throughout the day as she was not eating. With her symptoms worsening, her husband took her to a local fire department where her CBG was reportedly "high." EMS was activated for transport to the hospital and CBG was reportedly ">600" and she was hypotensive to 76/40. She was given 1 L of normal saline en route to the hospital with improvement in her pressures. Patient denies any recent fevers or chills and denies cough or dyspnea. She also denies chest pain or palpitations. She endorses polyuria, but denies dysuria or suprapubic tenderness.   Assessment & Plan:   Principal Problem:   Diabetic ketoacidosis without coma associated with type 2 diabetes mellitus (HCC) Active Problems:   Hypothyroidism   Hyperkalemia   H/O kidney transplant   Hypotension   Essential hypertension   GERD (gastroesophageal reflux disease)   Acute renal failure superimposed on stage 3 chronic kidney disease (HCC)   DKA (diabetic ketoacidoses) (HCC)   1-DKA, type II DM  Presents with anion gap metabolic  acidosis.  Was treated with insulin Gtt. She has been transition to levemir, gap close.  Increase levemir to 21 units BID. Increase meals coverage to 4 units.  Gap close, bicarb at 17, probably related to AKI.  Repeat labs in am.   2-Hyperkalemia; in setting DKA and AKI.  Resolved.   3-AKI; on CKD stage III; Kidney transplant.  SCr is 2.28 on admission, up. Cr baseline 1.5.  Improving with correction of DKA and IV fluids.  Continue with immune suppressive therapy.  Cr has decrease to 1.6. Continue with IV fluids.   4-Status-post renal transplantation - Continue Prograf, Cellcept, prednisone     5-GERD - No EGD report on file  - Managed with Zantac at home, will continue H2-blocker     6-Hypothyroidism  - Continue Synthroid   7-Left calf wound, present on arrival - Does not appear infected on admission  - Wound care consultation requested for recommendations   -no evidence of infection, Wound healing.   8-Hypotension;  improved with IV fluids.  Resolved.   9-Hyponatremia; Pseudohyponatremia due to hyperglycemia;  Improving.   10-HTN; resume norvasc , coreg.   11-Otitis , sinus headaches; sinusitis; Start flonase, also augmentin.    DVT prophylaxis: Heparin  Code Status:full code. Family Communication: care discussed with patient.  Disposition Plan: home in 24 to 48 hours.  Consultants:   none   Procedures: none   Antimicrobials: none   Subjective: Feeling well, still with mild headaches, sinus area.    Objective: Vitals:   08/12/16 2027 08/12/16 2354 08/13/16 0445 08/13/16 0744  BP: (!) 134/58 138/60 (!) 147/71 [name])  156/68  Pulse: 83 80 79 77  Resp: 15 17 15 12   Temp: 97.9 F (36.6 C) 98.7 F (37.1 C) 97.6 F (36.4 C) 97.4 F (36.3 C)  TempSrc: Oral Oral Oral Oral  SpO2: 99% 98% 95% 95%  Weight:      Height:        Intake/Output Summary (Last 24 hours) at 08/13/16 0900 Last data filed at 08/13/16 0820  Gross per 24 hour  Intake            1283.5 ml  Output                0 ml  Net           1283.5 ml   Filed Weights   08/11/16 2238  Weight: 89.4 kg (197 lb)    Examination:  General exam: Appears calm and comfortable  ENT; distension, redness of Bilateral timpani membrane Respiratory system: Clear to auscultation. Respiratory effort normal. Cardiovascular system: S1 & S2 heard, RRR. No JVD, murmurs, rubs, gallops or clicks. No pedal edema. Gastrointestinal system: Abdomen is nondistended, soft and nontender. No organomegaly or masses felt. Normal bowel sounds heard. Central nervous system: Alert and oriented. No focal neurological deficits. Extremities: Symmetric 5 x 5 power. Skin: No rashes, lesions or ulcers Psychiatry: Judgement and insight appear normal. Mood & affect appropriate.     Data Reviewed: I have personally reviewed following labs and imaging studies  CBC:  Recent Labs Lab 08/11/16 2252 08/11/16 2303 08/13/16 0415  WBC 6.9  --  5.0  NEUTROABS 5.5  --   --   HGB 11.3* 11.9* 10.7*  HCT 35.8* 35.0* 33.2*  MCV 90.2  --  86.0  PLT 203  --  165   Basic Metabolic Panel:  Recent Labs Lab 08/12/16 0254 08/12/16 0714 08/12/16 1528 08/12/16 1853 08/13/16 0415  NA 133* 134* 134* 132* 131*  K 4.2 4.1 4.6 4.8 4.5  CL 102 106 105 104 103  CO2 11* 20* 17* 15* 17*  GLUCOSE 513* 219* 163* 317* 358*  BUN 49* 50* 45* 45* 42*  CREATININE 2.25* 1.87* 1.78* 1.83* 1.63*  CALCIUM 8.8* 8.8* 9.2 9.5 9.3   GFR: Estimated Creatinine Clearance: 47.9 mL/min (by C-G formula based on SCr of 1.63 mg/dL (H)). Liver Function Tests:  Recent Labs Lab 08/11/16 2252  AST 13*  ALT 14  ALKPHOS 119  BILITOT 1.7*  PROT 6.1*  ALBUMIN 2.7*   No results for input(s): LIPASE, AMYLASE in the last 168 hours. No results for input(s): AMMONIA in the last 168 hours. Coagulation Profile: No results for input(s): INR, PROTIME in the last 168 hours. Cardiac Enzymes:  Recent Labs Lab 08/12/16 0254  TROPONINI  <0.03   BNP (last 3 results) No results for input(s): PROBNP in the last 8760 hours. HbA1C:  Recent Labs  08/12/16 0302  HGBA1C 12.5*   CBG:  Recent Labs Lab 08/12/16 0955 08/12/16 1102 08/12/16 1203 08/12/16 1639 08/12/16 2052  GLUCAP 109* 99 130* 206* 375*   Lipid Profile: No results for input(s): CHOL, HDL, LDLCALC, TRIG, CHOLHDL, LDLDIRECT in the last 72 hours. Thyroid Function Tests: No results for input(s): TSH, T4TOTAL, FREET4, T3FREE, THYROIDAB in the last 72 hours. Anemia Panel: No results for input(s): VITAMINB12, FOLATE, FERRITIN, TIBC, IRON, RETICCTPCT in the last 72 hours. Sepsis Labs: No results for input(s): PROCALCITON, LATICACIDVEN in the last 168 hours.  Recent Results (from the past 240 hour(s))  MRSA PCR Screening     Status:  None   Collection Time: 08/12/16  2:44 AM  Result Value Ref Range Status   MRSA by PCR NEGATIVE NEGATIVE Final    Comment:        The GeneXpert MRSA Assay (FDA approved for NASAL specimens only), is one component of a comprehensive MRSA colonization surveillance program. It is not intended to diagnose MRSA infection nor to guide or monitor treatment for MRSA infections.   Urine culture     Status: Abnormal   Collection Time: 08/12/16  2:52 AM  Result Value Ref Range Status   Specimen Description URINE, RANDOM  Final   Special Requests NONE  Final   Culture MULTIPLE SPECIES PRESENT, SUGGEST RECOLLECTION (A)  Final   Report Status 08/13/2016 FINAL  Final         Radiology Studies: Dg Chest 2 View  Result Date: 08/12/2016 CLINICAL DATA:  Diabetic ketoacidosis. EXAM: CHEST  2 VIEW COMPARISON:  Portable exam 04/15/2016 FINDINGS: The cardiomediastinal contours are normal. Mild chronic central bronchitic change. Pulmonary vasculature is normal. No consolidation, pleural effusion, or pneumothorax. No acute osseous abnormalities are seen. IMPRESSION: No acute abnormality. Electronically Signed   By: Rubye OaksMelanie  Ehinger M.D.    On: 08/12/2016 01:05        Scheduled Meds: . amLODipine  5 mg Oral Daily  . aspirin EC  81 mg Oral Daily  . carvedilol  6.25 mg Oral BID  . ciprofloxacin  1 drop Left Ear Q2H while awake  . fluticasone  1 spray Each Nare Daily  . heparin  5,000 Units Subcutaneous Q8H  . insulin aspart  0-9 Units Subcutaneous TID WC  . insulin aspart  4 Units Subcutaneous TID WC  . insulin detemir  21 Units Subcutaneous BID  . levothyroxine  137 mcg Oral QAC breakfast  . loratadine  10 mg Oral Daily  . mycophenolate  500 mg Oral BID  . predniSONE  5 mg Oral Daily  . sodium bicarbonate  1,300 mg Oral TID  . tacrolimus  0.5 mg Oral QHS  . tacrolimus  1 mg Oral Daily   Continuous Infusions: . sodium chloride 100 mL/hr at 08/12/16 1217     LOS: 1 day    Time spent: 35 minutes.     Alba Coryegalado, Edrik Rundle A, MD [facility] Pager [phone]  If 7PM-7AM, please contact night-coverage [email or web address] Password [email or web address] 08/13/2016, 9:00 AM

## 2016-08-13 NOTE — Evaluation (Signed)
Physical Therapy Evaluation Patient Details Name: Exie Parodyamela S Doucet MRN: [id number] DOB: 1966/05/24 Today's Date: 08/13/2016   History of Present Illness  pt presents with DKA.  Noted this is pt's 6th admit for the same in 6 months.  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    Clinical Impression  Pt generally unsteady and with reports of falls at home.  Pt admits to memory deficits at baseline, though unclear if this is pt's normal cognitive level.  Pt does indicate that her husband has to manage her meds for her due to cognitive deficits.  When asked about why she has memory deficits pt indicates she thinks it has to do with "old age".  Feel pt is safe for return to home from a mobility perspective, but that pt would benefit from HHPT to continue therapies and ise of a RW to A with pt's reported "balance problem".  Will continue to follow while on acute.      Follow Up Recommendations Home health PT;Supervision - Intermittent    Equipment Recommendations  Rolling walker with 5" wheels    Recommendations for Other Services       Precautions / Restrictions Precautions Precautions: Fall Restrictions Weight Bearing Restrictions: No      Mobility  Bed Mobility Overal bed mobility: Modified Independent             General bed mobility comments: pt needs increased time, but is able to complete without A.    Transfers Overall transfer level: Needs assistance Equipment used: None Transfers: Sit to/from Stand Sit to Stand: Min guard         General transfer comment: pt unsteady with coming to standing and takes a step back to catch her balance.  pt indicates this is normal for her to lose her balance occasionally.  pt needs use of UEs and LEs touching back on bed to catch balance.    Ambulation/Gait Ambulation/Gait assistance: Min guard Ambulation Distance (Feet): 200 Feet Assistive  device:  (IV pole) Gait Pattern/deviations: Step-through pattern;Decreased stride length     General Gait Details: pt declined to use RW, but requested to hold IV pole since she holds walls and furniture at home.  pt unsteady and needing the UE support from the IV pole.  pt with occasional side steps to catch balance, but no physical A needed.    Stairs            Wheelchair Mobility    Modified Rankin (Stroke Patients Only)       Balance Overall balance assessment: Needs assistance;History of Falls Sitting-balance support: No upper extremity supported;Feet supported Sitting balance-Leahy Scale: Good     Standing balance support: Single extremity supported;No upper extremity supported;During functional activity Standing balance-Leahy Scale: Poor                               Pertinent Vitals/Pain Pain Assessment: 0-10 Pain Score: 2  Pain Location: Tail bone- pt indicates she fell just PTA. Pain Descriptors / Indicators: Grimacing;Discomfort;Sore Pain Intervention(s): Monitored during session;Repositioned    Home Living Family/patient expects to be discharged to:: Private residence Living Arrangements: Spouse/significant other;Children Available Help at Discharge: Family;Available PRN/intermittently Type of Home: House Home Access: Stairs to enter Entrance Stairs-Rails: Right;Left;Can reach both (at 4 steps, but pt uses the 2 steps at the back door.) Entrance Progress EnergyStairs-Number  of Steps: 2 or 4 Home Layout: One level Home Equipment: Shower seat;Cane - single point;Walker - 4 wheels;Bedside commode      Prior Function Level of Independence: Needs assistance   Gait / Transfers Assistance Needed: pt indicates she doesn't use an AD, but holds on to furniture and the walls.    ADL's / Homemaking Assistance Needed: Husband performs house chores and heavy cooking.  Husband A with washing pt's back and feet.  pt's husband fills pill box for pt.        Hand  Dominance        Extremity/Trunk Assessment   Upper Extremity Assessment: Generalized weakness           Lower Extremity Assessment: Generalized weakness      Cervical / Trunk Assessment: Normal  Communication   Communication: No difficulties  Cognition Arousal/Alertness: Awake/alert Behavior During Therapy: WFL for tasks assessed/performed Overall Cognitive Status: No family/caregiver present to determine baseline cognitive functioning                 General Comments: pt indicates she has trouble with her memory at baseline and feels as though she is at baseline.  pt does indicate her husband manages her meds because she can not remember what to take.      General Comments      Exercises     Assessment/Plan    PT Assessment Patient needs continued PT services  PT Problem List Decreased strength;Decreased activity tolerance;Decreased balance;Decreased mobility;Decreased coordination;Decreased cognition;Decreased knowledge of use of DME;Pain          PT Treatment Interventions DME instruction;Gait training;Stair training;Functional mobility training;Therapeutic activities;Balance training;Therapeutic exercise;Cognitive remediation;Patient/family education    PT Goals (Current goals can be found in the Care Plan section)  Acute Rehab PT Goals Patient Stated Goal: Home PT Goal Formulation: With patient Time For Goal Achievement: 08/27/16 Potential to Achieve Goals: Good    Frequency Min 3X/week   Barriers to discharge        Co-evaluation               End of Session Equipment Utilized During Treatment: Gait belt Activity Tolerance: Patient limited by fatigue Patient left: in bed;with call bell/phone within reach;with bed alarm set Nurse Communication: Mobility status         Time: [id number] PT Time Calculation (min) (ACUTE ONLY): 21 min   Charges:   PT Evaluation $PT Eval Moderate Complexity: 1 Procedure     PT G CodesAlison Murray:         Darcell Sabino F Javar Eshbach, PT  [phone] 08/13/2016, 12:02 PM

## 2016-08-14 LAB — GLUCOSE, CAPILLARY
GLUCOSE-CAPILLARY: 242 mg/dL — AB (ref 65–99)
Glucose-Capillary: 237 mg/dL — ABNORMAL HIGH (ref 65–99)

## 2016-08-14 LAB — BASIC METABOLIC PANEL
Anion gap: 8 (ref 5–15)
BUN: 22 mg/dL — AB (ref 6–20)
CHLORIDE: 106 mmol/L (ref 101–111)
CO2: 22 mmol/L (ref 22–32)
CREATININE: 1.21 mg/dL — AB (ref 0.44–1.00)
Calcium: 9.8 mg/dL (ref 8.9–10.3)
GFR calc Af Amer: 60 mL/min — ABNORMAL LOW (ref >60)
GFR calc non Af Amer: 52 mL/min — ABNORMAL LOW (ref >60)
GLUCOSE: 265 mg/dL — AB (ref 65–99)
POTASSIUM: 3.8 mmol/L (ref 3.5–5.1)
SODIUM: 136 mmol/L (ref 135–145)

## 2016-08-14 MED ORDER — SODIUM BICARBONATE 650 MG PO TABS: 650.0000 mg | ORAL_TABLET | Freq: Two times a day (BID) | ORAL | 0 refills | Status: DC

## 2016-08-14 MED ORDER — FLUTICASONE PROPIONATE 50 MCG/ACT NA SUSP: 1.0000 | Freq: Every day | NASAL | 2 refills | Status: DC

## 2016-08-14 MED ORDER — AMLODIPINE BESYLATE 10 MG PO TABS
10.0000 mg | ORAL_TABLET | Freq: Every day | ORAL | Status: DC
  Administered 2016-08-14: 10 mg via ORAL
  Filled 2016-08-14: qty 1

## 2016-08-14 MED ORDER — INSULIN NPH ISOPHANE & REGULAR (70-30) 100 UNIT/ML ~~LOC~~ SUSP: 35.0000 [IU] | Freq: Two times a day (BID) | SUBCUTANEOUS | 2 refills | Status: DC

## 2016-08-14 MED ORDER — AMLODIPINE BESYLATE 10 MG PO TABS: 10.0000 mg | ORAL_TABLET | Freq: Every day | ORAL | 0 refills | Status: DC

## 2016-08-14 MED ORDER — AMOXICILLIN-POT CLAVULANATE 500-125 MG PO TABS: 1.0000 | ORAL_TABLET | Freq: Two times a day (BID) | ORAL | 0 refills | Status: DC

## 2016-08-14 MED ORDER — LORATADINE 10 MG PO TABS: 10.0000 mg | ORAL_TABLET | Freq: Every day | ORAL | 0 refills | Status: DC

## 2016-08-14 NOTE — Care Management Note (Signed)
Case Management Note  Patient Details  Name: Exie Parodyamela S Sleep MRN: [id number] Date of Birth: 08/20/1966  Subjective/Objective:     DKA               Action/Plan: Discharge Planning: AVS reviewed: [facility]. Contacted [facility] DME rep for RW. [facility]   PCP Dois DavenportICHTER, KAREN L MD  Expected Discharge Date:  08/14/2016               Expected Discharge Plan:  Home w Home Health Services  In-House Referral:  NA  Discharge planning Services  CM Consult  Post Acute Care Choice:  Durable Medical Equipment, Home Health Choice offered to:  Patient  DME Arranged:  Walker rolling DME Agency:  [facility]  [facility]  Status of Service:  Completed, signed off  If discussed at Long Length of Stay Meetings, dates discussed:    Additional Comments:  Elliot CousinShavis, Toussaint Golson Ellen, RN 08/14/2016, 10:39 AM

## 2016-08-14 NOTE — Progress Notes (Signed)
Pt discharge instructions and prescriptions given, pt verbalized understanding.  VSS.  Denies pain.  Pt waiting on transportation home.

## 2016-08-14 NOTE — Discharge Summary (Signed)
Physician Discharge Summary  [name] WGN:[id number] DOB: 06/07/1966 DOA: 08/11/2016  PCP: Dois Davenport., MD  Admit date: 08/11/2016 Discharge date: 08/14/2016  Admitted From: home  Disposition: Home   Recommendations for Outpatient Follow-up:  1. Follow up with PCP in 1-2 weeks 2. Please obtain BMP/CBC in one week 3. Please follow up on the following pending results: needs referral to endocrinologist. Continue counseling regarding compliance with Diet and insulin   Home Health; yes.    Discharge Condition: Stable.  CODE STATUS: Full code.  Diet recommendation:  Carb Modified   Brief/Interim Summary: [name] a 50 y.o.femalewith medical history significant for insulin-dependent diabetes mellitus with frequent DKA, history of renal transplant, CKD stage III, hypertension, and hypothyroidism who presents to the emergency department with high CBG and 1 day of malaise. Patient reports that she had been in her usual state of health until the insidious development of a general malaise last night. She reports that the initial symptoms involved mild and generalized aches and discomfort. Symptoms worsened today, became accompanied by nausea and 1 episode of nonbloody nonbilious vomiting, and the patient noted CBG to be in the 400searly in the day. She reports taking her dose of long-acting insulin on the morning of presentation, but states that she did not take any other insulin throughout the day as she was not eating. With her symptoms worsening, her husband took her to a local fire department where her CBG was reportedly "high." EMS was activated for transport to the hospital and CBG was reportedly ">600"and she was hypotensive to 76/40. She was given 1 L of normal saline en route to the hospital with improvement in her pressures. Patient denies any recent fevers or chills and denies cough or dyspnea. She also denies chest pain or palpitations. She endorses polyuria, but denies  dysuria or suprapubic tenderness.   Assessment & Plan: 1-DKA, type II DM  Presents with anion gap metabolic acidosis.  Was treated with insulin Gtt. She has been transition to levemir, gap close.  Increase levemir to 21 units BID. Increase meals coverage to 4 units.  Gap close.  Patient will be discharge on 70/30 35 units BID. Needs further titration of insulin. Needs referral to endocrinologist  Hb A1c at 12.   2-Hyperkalemia; in setting DKA and AKI.  Resolved.   3-AKI; on CKD stage III; Kidney transplant.  SCr is 2.28 on admission, up. Cr baseline 1.5.  Improving with correction of DKA and IV fluids.  Continue with immune suppressive therapy.  Cr has decrease to 1.2 Metabolic acidosis persisted after correction of DkA, and anion gap close. Persistence of acidosis was thought to be related to AKI. Patient improved on bicarb tablets.   4-Status-post renal transplantation - Continue Prograf, Cellcept, prednisone   5-GERD - No EGD report on file  - Managed with Zantac at home, will continue H2-blocker    6-Hypothyroidism  - Continue Synthroid   7-Left calf wound, present on arrival - Does not appear infected on admission  - Wound care consultation requested for recommendations  -no evidence of infection, Wound healing.   8-Hypotension;  improved with IV fluids.  Resolved.   9-Hyponatremia; Pseudohyponatremia due to hyperglycemia;  Resolved.   10-HTN; resume norvasc , coreg. Increase norvasc to 10 mg daily to help controlled BP better   11-Left Otitis media , sinus headaches; sinusitis; Start flonase, Augmentin for 5 days.    Discharge Diagnoses:  Principal Problem:   Diabetic ketoacidosis without coma associated with type 2  diabetes mellitus (HCC) Active Problems:   Hypothyroidism   Hyperkalemia   H/O kidney transplant   Hypotension   Essential hypertension   GERD (gastroesophageal reflux disease)   Acute renal failure superimposed on stage 3  chronic kidney disease (HCC)   DKA (diabetic ketoacidoses) [facility])    Discharge Instructions  Discharge Instructions    AMB Referral to [facility] Care Management    Complete by:  As directed    Please assign member to Telephonic [facility] RNCM. She is only agreeable to telephone calls and not home visits. Written consent obtained. Currently at [facility]. Please call with questions. Raiford Noble, MSN-Ed, [facility] Liaison-[phone]   Reason for consult:  Please assign to Telephonic [facility] RNCM- only agreeable to telephonic and not home visits.   Diagnoses of:  Diabetes   Expected date of contact:  1-3 days (reserved for hospital discharges)   Diet - low sodium heart healthy    Complete by:  As directed    Increase activity slowly    Complete by:  As directed        Medication List    STOP taking these medications   doxycycline 100 MG capsule Commonly known as:  VIBRAMYCIN   insulin aspart 100 UNIT/ML injection Commonly known as:  novoLOG   Insulin Detemir 100 UNIT/ML Pen Commonly known as:  LEVEMIR     TAKE these medications   acetaminophen 325 MG tablet Commonly known as:  TYLENOL Take 2 tablets (650 mg total) by mouth every 6 (six) hours as needed for mild pain, moderate pain, fever or headache.   amLODipine 10 MG tablet Commonly known as:  NORVASC Take 1 tablet (10 mg total) by mouth daily. What changed:  medication strength  how much to take   amoxicillin-clavulanate 500-125 MG tablet Commonly known as:  AUGMENTIN Take 1 tablet (500 mg total) by mouth 2 (two) times daily.   aspirin EC 81 MG tablet Take 81 mg by mouth daily.   carvedilol 6.25 MG tablet Commonly known as:  COREG Take 1 tablet (6.25 mg total) by mouth 2 (two) times daily. What changed:  how much to take   fluticasone 50 MCG/ACT nasal spray Commonly known as:  FLONASE Place 1 spray into both nostrils daily.   HYDROcodone-acetaminophen 5-325 MG tablet Commonly known as:   NORCO/VICODIN Take 1-2 tablets by mouth every 6 (six) hours as needed for severe pain or moderate pain.   insulin NPH-regular Human (70-30) 100 UNIT/ML injection Commonly known as:  NOVOLIN 70/30 Inject 35 Units into the skin 2 (two) times daily. What changed:  how much to take   levothyroxine 137 MCG tablet Commonly known as:  SYNTHROID, LEVOTHROID Take 1 tablet (137 mcg total) by mouth daily. BRAND NAME ONLY   loratadine 10 MG tablet Commonly known as:  CLARITIN Take 1 tablet (10 mg total) by mouth daily.   mycophenolate 250 MG capsule Commonly known as:  CELLCEPT Take 500 mg by mouth 2 (two) times daily.   predniSONE 5 MG tablet Commonly known as:  DELTASONE Take 5 mg by mouth at bedtime.   ranitidine 150 MG tablet Commonly known as:  ZANTAC Take 150 mg by mouth at bedtime.   sodium bicarbonate 650 MG tablet Take 1 tablet (650 mg total) by mouth 2 (two) times daily.   SSD 1 % cream Generic drug:  silver sulfADIAZINE Apply 1 application topically daily.   tacrolimus 0.5 MG capsule Commonly known as:  PROGRAF Take 0.5-1 mg by  mouth See admin instructions. Take 1 mg by mouth in the morning and take 0.5 mg by mouth at night.            Durable Medical Equipment        Start     Ordered   08/14/16 0915  For home use only DME Walker  Once    Question:  Patient needs a walker to treat with the following condition  Answer:  Balance disorder   08/14/16 0914     Follow-up Information    Nadyne Coombes L., MD Follow up.   Specialty:  Family Medicine Contact information: [address] [place] [address] [phone]          Allergies  Allergen Reactions  . Ace Inhibitors Cough  . Adhesive [Tape] Itching and Other (See Comments)    Please use paper tape  . Lisinopril Cough    Consultations:  none   Procedures/Studies: Dg Chest 2 View  Result Date: 08/12/2016 CLINICAL DATA:  Diabetic ketoacidosis. EXAM: CHEST  2 VIEW COMPARISON:  Portable  exam 04/15/2016 FINDINGS: The cardiomediastinal contours are normal. Mild chronic central bronchitic change. Pulmonary vasculature is normal. No consolidation, pleural effusion, or pneumothorax. No acute osseous abnormalities are seen. IMPRESSION: No acute abnormality. Electronically Signed   By: Rubye Oaks M.D.   On: 08/12/2016 01:05       Subjective: Feeling well, improving   Discharge Exam: Vitals:   08/14/16 0446 08/14/16 0924  BP: (!) 157/68 (!) 164/80  Pulse: 79 79  Resp: 17 16  Temp: 98 F (36.7 C) 97.6 F (36.4 C)   Vitals:   08/13/16 1954 08/13/16 2127 08/14/16 0446 08/14/16 0924  BP: (!) 184/82 (!) 194/84 (!) 157/68 (!) 164/80  Pulse: 77 87 79 79  Resp: 15 20 17 16   Temp: 97.6 F (36.4 C) 97.6 F (36.4 C) 98 F (36.7 C) 97.6 F (36.4 C)  TempSrc: Oral   Oral  SpO2: 97% 95% 94% 97%  Weight:  90.7 kg (200 lb)    Height:        General: Pt is alert, awake, not in acute distress Cardiovascular: RRR, S1/S2 +, no rubs, no gallops Respiratory: CTA bilaterally, no wheezing, no rhonchi Abdominal: Soft, NT, ND, bowel sounds + Extremities: no edema, no cyanosis    The results of significant diagnostics from this hospitalization (including imaging, microbiology, ancillary and laboratory) are listed below for reference.     Microbiology: Recent Results (from the past 240 hour(s))  MRSA PCR Screening     Status: None   Collection Time: 08/12/16  2:44 AM  Result Value Ref Range Status   MRSA by PCR NEGATIVE NEGATIVE Final    Comment:        The GeneXpert MRSA Assay (FDA approved for NASAL specimens only), is one component of a comprehensive MRSA colonization surveillance program. It is not intended to diagnose MRSA infection nor to guide or monitor treatment for MRSA infections.   Urine culture     Status: Abnormal   Collection Time: 08/12/16  2:52 AM  Result Value Ref Range Status   Specimen Description URINE, RANDOM  Final   Special Requests NONE   Final   Culture MULTIPLE SPECIES PRESENT, SUGGEST RECOLLECTION (A)  Final   Report Status 08/13/2016 FINAL  Final  Culture, blood (routine x 2)     Status: None (Preliminary result)   Collection Time: 08/12/16  2:54 AM  Result Value Ref Range Status   Specimen Description BLOOD  RIGHT ARM  Final   Special Requests IN PEDIATRIC BOTTLE 2ML  Final   Culture NO GROWTH 1 DAY  Final   Report Status PENDING  Incomplete  Culture, blood (routine x 2)     Status: None (Preliminary result)   Collection Time: 08/12/16  3:02 AM  Result Value Ref Range Status   Specimen Description BLOOD RIGHT HAND  Final   Special Requests IN PEDIATRIC BOTTLE 1ML  Final   Culture NO GROWTH 1 DAY  Final   Report Status PENDING  Incomplete     Labs: BNP (last 3 results) No results for input(s): BNP in the last 8760 hours. Basic Metabolic Panel:  Recent Labs Lab 08/12/16 0714 08/12/16 1528 08/12/16 1853 08/13/16 0415 08/14/16 0550  NA 134* 134* 132* 131* 136  K 4.1 4.6 4.8 4.5 3.8  CL 106 105 104 103 106  CO2 20* 17* 15* 17* 22  GLUCOSE 219* 163* 317* 358* 265*  BUN 50* 45* 45* 42* 22*  CREATININE 1.87* 1.78* 1.83* 1.63* 1.21*  CALCIUM 8.8* 9.2 9.5 9.3 9.8   Liver Function Tests:  Recent Labs Lab 08/11/16 2252  AST 13*  ALT 14  ALKPHOS 119  BILITOT 1.7*  PROT 6.1*  ALBUMIN 2.7*   No results for input(s): LIPASE, AMYLASE in the last 168 hours. No results for input(s): AMMONIA in the last 168 hours. CBC:  Recent Labs Lab 08/11/16 2252 08/11/16 2303 08/13/16 0415  WBC 6.9  --  5.0  NEUTROABS 5.5  --   --   HGB 11.3* 11.9* 10.7*  HCT 35.8* 35.0* 33.2*  MCV 90.2  --  86.0  PLT 203  --  165   Cardiac Enzymes:  Recent Labs Lab 08/12/16 0254  TROPONINI <0.03   BNP: Invalid input(s): POCBNP CBG:  Recent Labs Lab 08/13/16 0857 08/13/16 1133 08/13/16 1720 08/13/16 2155 08/14/16 0743  GLUCAP 311* 329* 225* 297* 242*   D-Dimer No results for input(s): DDIMER in the last 72  hours. Hgb A1c  Recent Labs  08/12/16 0302  HGBA1C 12.5*   Lipid Profile No results for input(s): CHOL, HDL, LDLCALC, TRIG, CHOLHDL, LDLDIRECT in the last 72 hours. Thyroid function studies No results for input(s): TSH, T4TOTAL, T3FREE, THYROIDAB in the last 72 hours.  Invalid input(s): FREET3 Anemia work up No results for input(s): VITAMINB12, FOLATE, FERRITIN, TIBC, IRON, RETICCTPCT in the last 72 hours. Urinalysis    Component Value Date/Time   COLORURINE YELLOW 08/12/2016 0252   APPEARANCEUR HAZY (A) 08/12/2016 0252   LABSPEC 1.018 08/12/2016 0252   PHURINE 5.0 08/12/2016 0252   GLUCOSEU >=500 (A) 08/12/2016 0252   HGBUR NEGATIVE 08/12/2016 0252   BILIRUBINUR NEGATIVE 08/12/2016 0252   KETONESUR 20 (A) 08/12/2016 0252   PROTEINUR 100 (A) 08/12/2016 0252   UROBILINOGEN 0.2 03/02/2015 1153   NITRITE NEGATIVE 08/12/2016 0252   LEUKOCYTESUR NEGATIVE 08/12/2016 0252   Sepsis Labs Invalid input(s): PROCALCITONIN,  WBC,  LACTICIDVEN Microbiology Recent Results (from the past 240 hour(s))  MRSA PCR Screening     Status: None   Collection Time: 08/12/16  2:44 AM  Result Value Ref Range Status   MRSA by PCR NEGATIVE NEGATIVE Final    Comment:        The GeneXpert MRSA Assay (FDA approved for NASAL specimens only), is one component of a comprehensive MRSA colonization surveillance program. It is not intended to diagnose MRSA infection nor to guide or monitor treatment for MRSA infections.   Urine culture  Status: Abnormal   Collection Time: 08/12/16  2:52 AM  Result Value Ref Range Status   Specimen Description URINE, RANDOM  Final   Special Requests NONE  Final   Culture MULTIPLE SPECIES PRESENT, SUGGEST RECOLLECTION (A)  Final   Report Status 08/13/2016 FINAL  Final  Culture, blood (routine x 2)     Status: None (Preliminary result)   Collection Time: 08/12/16  2:54 AM  Result Value Ref Range Status   Specimen Description BLOOD RIGHT ARM  Final   Special  Requests IN PEDIATRIC BOTTLE 2ML  Final   Culture NO GROWTH 1 DAY  Final   Report Status PENDING  Incomplete  Culture, blood (routine x 2)     Status: None (Preliminary result)   Collection Time: 08/12/16  3:02 AM  Result Value Ref Range Status   Specimen Description BLOOD RIGHT HAND  Final   Special Requests IN PEDIATRIC BOTTLE 1ML  Final   Culture NO GROWTH 1 DAY  Final   Report Status PENDING  Incomplete     Time coordinating discharge: Over 30 minutes  SIGNED:   Alba Coryegalado, Kenzlie Disch A, MD  [facility] 08/14/2016, 9:38 AM Pager   If 7PM-7AM, please contact night-coverage [email or web address] Password [email or web address]

## 2016-08-17 ENCOUNTER — Other Ambulatory Visit: Payer: Self-pay

## 2016-08-17 LAB — CULTURE, BLOOD (ROUTINE X 2)
Culture: NO GROWTH
Culture: NO GROWTH

## 2016-08-17 NOTE — Patient Outreach (Signed)
[facility]) Care Management  08/17/2016  [name] [birth year] [id number]  REFERRAL DATE: 08/12/16 [facility]  Patient was still admitted at time of referral REFERRAL REASON: Transition of care follow up/ diabetes  Telephone call to patient regarding transition of care referral. Unable to reach patient or leave voice message. Message states to leave call back number.  Call back phone number left.    PLAN; RNCM will attempt 2nd telephone outreach to patient within 1 week.   George InaDavina Nadalie Laughner RN,BSN,CCM [facility] Telephonic  [phone]

## 2016-08-18 ENCOUNTER — Ambulatory Visit: Payer: Self-pay

## 2016-08-19 ENCOUNTER — Other Ambulatory Visit: Payer: Self-pay

## 2016-08-19 NOTE — Patient Outreach (Signed)
[facility]) Care Management  08/19/2016  [name] 12/22/1965 [id number]   REFERRAL DATE: 08/12/16 [facility]  Patient was still admitted at time of referral REFERRAL REASON: Transition of care follow up/ diabetes  Second transition of care call attempt to patient. Unable to reach patient or leave voice message.  Message states " leave mail box number" Call listed phone number in patients electronic medical record.  Only number listed for patient. PLAN; RNCM will attempt 3rd telephone outreach to patient within  1 week.  George InaDavina Demarian Epps RN,BSN,CCM [facility] Telephonic  [phone]

## 2016-08-20 ENCOUNTER — Other Ambulatory Visit: Payer: Self-pay

## 2016-08-20 NOTE — Patient Outreach (Signed)
[facility]) Care Management  08/20/2016  [name] Parodyamela S Stick [birth year] [id number]  REFERRAL DATE: 08/12/16 [facility]  Patient was still admitted at time of referral REFERRAL REASON: Transition of care follow up/ diabetes  Third telephone outreach to patient regarding transition of care follow up. Unable to reach patient or leave voice message.  Message states "leave mailbox number"  PLAN;  RNCM will send outreach letter to patient to attempt contact.  George InaDavina Dorethea Strubel RN,BSN,CCM [facility] Telephonic  [phone]

## 2016-08-24 ENCOUNTER — Emergency Department (HOSPITAL_COMMUNITY)
Admission: EM | Admit: 2016-08-24 | Discharge: 2016-08-24 | Disposition: A | Discharge status: Home / Self Care | Payer: Medicare Other | Attending: Dermatology | Admitting: Dermatology

## 2016-08-24 ENCOUNTER — Encounter (HOSPITAL_COMMUNITY): Payer: Self-pay

## 2016-08-24 DIAGNOSIS — Z5321 Procedure and treatment not carried out due to patient leaving prior to being seen by health care provider: Secondary | ICD-10-CM | POA: Insufficient documentation

## 2016-08-24 DIAGNOSIS — E1122 Type 2 diabetes mellitus with diabetic chronic kidney disease: Secondary | ICD-10-CM | POA: Diagnosis not present

## 2016-08-24 DIAGNOSIS — E1165 Type 2 diabetes mellitus with hyperglycemia: Secondary | ICD-10-CM | POA: Insufficient documentation

## 2016-08-24 DIAGNOSIS — N186 End stage renal disease: Secondary | ICD-10-CM | POA: Insufficient documentation

## 2016-08-24 DIAGNOSIS — Z7982 Long term (current) use of aspirin: Secondary | ICD-10-CM | POA: Insufficient documentation

## 2016-08-24 DIAGNOSIS — Z87891 Personal history of nicotine dependence: Secondary | ICD-10-CM | POA: Diagnosis not present

## 2016-08-24 DIAGNOSIS — Z992 Dependence on renal dialysis: Secondary | ICD-10-CM | POA: Insufficient documentation

## 2016-08-24 DIAGNOSIS — E039 Hypothyroidism, unspecified: Secondary | ICD-10-CM | POA: Insufficient documentation

## 2016-08-24 DIAGNOSIS — I120 Hypertensive chronic kidney disease with stage 5 chronic kidney disease or end stage renal disease: Secondary | ICD-10-CM | POA: Diagnosis not present

## 2016-08-24 DIAGNOSIS — Z794 Long term (current) use of insulin: Secondary | ICD-10-CM | POA: Insufficient documentation

## 2016-08-24 LAB — CBC
HCT: 36.9 % (ref 36.0–46.0)
Hemoglobin: 12.3 g/dL (ref 12.0–15.0)
MCH: 28.8 pg (ref 26.0–34.0)
MCHC: 33.3 g/dL (ref 30.0–36.0)
MCV: 86.4 fL (ref 78.0–100.0)
PLATELETS: 223 10*3/uL (ref 150–400)
RBC: 4.27 MIL/uL (ref 3.87–5.11)
RDW: 13.6 % (ref 11.5–15.5)
WBC: 5.1 10*3/uL (ref 4.0–10.5)

## 2016-08-24 LAB — URINALYSIS, ROUTINE W REFLEX MICROSCOPIC
Bilirubin Urine: NEGATIVE
Hgb urine dipstick: NEGATIVE
KETONES UR: 80 mg/dL — AB
LEUKOCYTES UA: NEGATIVE
NITRITE: NEGATIVE
PROTEIN: 100 mg/dL — AB
Specific Gravity, Urine: 1.022 (ref 1.005–1.030)
pH: 5 (ref 5.0–8.0)

## 2016-08-24 LAB — BASIC METABOLIC PANEL
Anion gap: 15 (ref 5–15)
BUN: 26 mg/dL — AB (ref 6–20)
CO2: 17 mmol/L — ABNORMAL LOW (ref 22–32)
CREATININE: 1.91 mg/dL — AB (ref 0.44–1.00)
Calcium: 10 mg/dL (ref 8.9–10.3)
Chloride: 97 mmol/L — ABNORMAL LOW (ref 101–111)
GFR calc Af Amer: 34 mL/min — ABNORMAL LOW (ref >60)
GFR, EST NON AFRICAN AMERICAN: 30 mL/min — AB (ref >60)
Glucose, Bld: 487 mg/dL — ABNORMAL HIGH (ref 65–99)
Potassium: 4.4 mmol/L (ref 3.5–5.1)
SODIUM: 129 mmol/L — AB (ref 135–145)

## 2016-08-24 LAB — CBG MONITORING, ED: GLUCOSE-CAPILLARY: 446 mg/dL — AB (ref 65–99)

## 2016-08-24 NOTE — ED Notes (Signed)
Unable to locate pt x2

## 2016-08-24 NOTE — ED Triage Notes (Signed)
Patient complains of blood sugars running extremely high for the past several days. Reports recent admission to hospital for same. On arrival patient sleepy and slow to respond to questions. With verbal stimulation will awake and answers questions appropriately. Patient states that she has been taking her meds as prescribed, denies pain

## 2016-08-24 NOTE — ED Notes (Signed)
Unable to locate pt  

## 2016-09-10 ENCOUNTER — Other Ambulatory Visit: Payer: Self-pay

## 2016-09-10 NOTE — Patient Outreach (Signed)
[facility]) Care Management  09/10/2016  Exie Parodyamela S Folds 1965/11/26 [id number]   No response from patient after 3 telephone attempts and [facility].   PLAN; RNCM will refer patient to care management assistant to close due to inability to establish contact with patient. RNCM will notify patients primary MD of closure  George InaDavina Olive Zmuda RN,BSN,CCM [facility] Telephonic  [phone]

## 2016-09-16 ENCOUNTER — Encounter (HOSPITAL_COMMUNITY): Payer: Self-pay | Admitting: Emergency Medicine

## 2016-09-16 ENCOUNTER — Inpatient Hospital Stay (HOSPITAL_COMMUNITY)
Admission: EM | Admit: 2016-09-16 | Discharge: 2016-09-18 | DRG: 698 | Disposition: A | Discharge status: Home / Self Care | Payer: Medicare Other | Attending: Family Medicine | Admitting: Family Medicine

## 2016-09-16 DIAGNOSIS — K219 Gastro-esophageal reflux disease without esophagitis: Secondary | ICD-10-CM | POA: Diagnosis present

## 2016-09-16 DIAGNOSIS — Z7982 Long term (current) use of aspirin: Secondary | ICD-10-CM

## 2016-09-16 DIAGNOSIS — I10 Essential (primary) hypertension: Secondary | ICD-10-CM | POA: Diagnosis present

## 2016-09-16 DIAGNOSIS — Z888 Allergy status to other drugs, medicaments and biological substances status: Secondary | ICD-10-CM | POA: Diagnosis not present

## 2016-09-16 DIAGNOSIS — E871 Hypo-osmolality and hyponatremia: Secondary | ICD-10-CM | POA: Diagnosis present

## 2016-09-16 DIAGNOSIS — Z940 Kidney transplant status: Secondary | ICD-10-CM

## 2016-09-16 DIAGNOSIS — I959 Hypotension, unspecified: Secondary | ICD-10-CM | POA: Diagnosis present

## 2016-09-16 DIAGNOSIS — N179 Acute kidney failure, unspecified: Secondary | ICD-10-CM | POA: Diagnosis present

## 2016-09-16 DIAGNOSIS — Z89412 Acquired absence of left great toe: Secondary | ICD-10-CM | POA: Diagnosis not present

## 2016-09-16 DIAGNOSIS — Z7952 Long term (current) use of systemic steroids: Secondary | ICD-10-CM | POA: Diagnosis not present

## 2016-09-16 DIAGNOSIS — E039 Hypothyroidism, unspecified: Secondary | ICD-10-CM | POA: Diagnosis present

## 2016-09-16 DIAGNOSIS — Y830 Surgical operation with transplant of whole organ as the cause of abnormal reaction of the patient, or of later complication, without mention of misadventure at the time of the procedure: Secondary | ICD-10-CM | POA: Diagnosis present

## 2016-09-16 DIAGNOSIS — E875 Hyperkalemia: Secondary | ICD-10-CM | POA: Diagnosis present

## 2016-09-16 DIAGNOSIS — N183 Chronic kidney disease, stage 3 unspecified: Secondary | ICD-10-CM | POA: Diagnosis present

## 2016-09-16 DIAGNOSIS — E1122 Type 2 diabetes mellitus with diabetic chronic kidney disease: Secondary | ICD-10-CM | POA: Diagnosis present

## 2016-09-16 DIAGNOSIS — E860 Dehydration: Secondary | ICD-10-CM | POA: Diagnosis present

## 2016-09-16 DIAGNOSIS — Z79899 Other long term (current) drug therapy: Secondary | ICD-10-CM | POA: Diagnosis not present

## 2016-09-16 DIAGNOSIS — M179 Osteoarthritis of knee, unspecified: Secondary | ICD-10-CM | POA: Diagnosis present

## 2016-09-16 DIAGNOSIS — I129 Hypertensive chronic kidney disease with stage 1 through stage 4 chronic kidney disease, or unspecified chronic kidney disease: Secondary | ICD-10-CM | POA: Diagnosis present

## 2016-09-16 DIAGNOSIS — M1990 Unspecified osteoarthritis, unspecified site: Secondary | ICD-10-CM | POA: Diagnosis present

## 2016-09-16 DIAGNOSIS — M19029 Primary osteoarthritis, unspecified elbow: Secondary | ICD-10-CM | POA: Diagnosis present

## 2016-09-16 DIAGNOSIS — Z794 Long term (current) use of insulin: Secondary | ICD-10-CM | POA: Diagnosis not present

## 2016-09-16 DIAGNOSIS — Z9114 Patient's other noncompliance with medication regimen: Secondary | ICD-10-CM | POA: Diagnosis not present

## 2016-09-16 DIAGNOSIS — E1110 Type 2 diabetes mellitus with ketoacidosis without coma: Secondary | ICD-10-CM | POA: Diagnosis present

## 2016-09-16 DIAGNOSIS — Z89421 Acquired absence of other right toe(s): Secondary | ICD-10-CM

## 2016-09-16 DIAGNOSIS — T8612 Kidney transplant failure: Principal | ICD-10-CM | POA: Diagnosis present

## 2016-09-16 DIAGNOSIS — I248 Other forms of acute ischemic heart disease: Secondary | ICD-10-CM | POA: Diagnosis present

## 2016-09-16 DIAGNOSIS — E1310 Other specified diabetes mellitus with ketoacidosis without coma: Secondary | ICD-10-CM | POA: Diagnosis not present

## 2016-09-16 LAB — URINALYSIS, ROUTINE W REFLEX MICROSCOPIC
BILIRUBIN URINE: NEGATIVE
Glucose, UA: >=500 mg/dL — AB
Ketones, ur: 80 mg/dL — AB
LEUKOCYTES UA: NEGATIVE
NITRITE: NEGATIVE
PROTEIN: 100 mg/dL — AB
Specific Gravity, Urine: 1.021 (ref 1.005–1.030)
pH: 5 (ref 5.0–8.0)

## 2016-09-16 LAB — BASIC METABOLIC PANEL
ANION GAP: 26 — AB (ref 5–15)
Anion gap: 10 (ref 5–15)
BUN: 49 mg/dL — ABNORMAL HIGH (ref 6–20)
BUN: 43 mg/dL — AB (ref 6–20)
CO2: 7 mmol/L — ABNORMAL LOW (ref 22–32)
CO2: 18 mmol/L — ABNORMAL LOW (ref 22–32)
CREATININE: 1.65 mg/dL — AB (ref 0.44–1.00)
Calcium: 9 mg/dL (ref 8.9–10.3)
Calcium: 9.1 mg/dL (ref 8.9–10.3)
Chloride: 93 mmol/L — ABNORMAL LOW (ref 101–111)
Chloride: 106 mmol/L (ref 101–111)
Creatinine, Ser: 2.04 mg/dL — ABNORMAL HIGH (ref 0.44–1.00)
GFR calc Af Amer: 32 mL/min — ABNORMAL LOW (ref >60)
GFR calc Af Amer: 41 mL/min — ABNORMAL LOW (ref >60)
GFR, EST NON AFRICAN AMERICAN: 27 mL/min — AB (ref >60)
GFR, EST NON AFRICAN AMERICAN: 36 mL/min — AB (ref >60)
GLUCOSE: 870 mg/dL — AB (ref 65–99)
Glucose, Bld: 243 mg/dL — ABNORMAL HIGH (ref 65–99)
POTASSIUM: 5.4 mmol/L — AB (ref 3.5–5.1)
POTASSIUM: 3.7 mmol/L (ref 3.5–5.1)
SODIUM: 134 mmol/L — AB (ref 135–145)
Sodium: 126 mmol/L — ABNORMAL LOW (ref 135–145)

## 2016-09-16 LAB — BLOOD GAS, VENOUS
ACID-BASE DEFICIT: 23.3 mmol/L — AB (ref 0.0–2.0)
Bicarbonate: 5.7 mmol/L — ABNORMAL LOW (ref 20.0–28.0)
O2 SAT: 66.5 %
Patient temperature: 98.6
pCO2, Ven: 19.7 mmHg — CL (ref 44.0–60.0)
pH, Ven: 7.087 — CL (ref 7.250–7.430)
pO2, Ven: 44.2 mmHg (ref 32.0–45.0)

## 2016-09-16 LAB — I-STAT CG4 LACTIC ACID, ED
Lactic Acid, Venous: 1.76 mmol/L (ref 0.5–1.9)
Lactic Acid, Venous: 2.06 mmol/L (ref 0.5–1.9)

## 2016-09-16 LAB — RAPID URINE DRUG SCREEN, HOSP PERFORMED
AMPHETAMINES: NOT DETECTED
Barbiturates: NOT DETECTED
Benzodiazepines: NOT DETECTED
COCAINE: NOT DETECTED
OPIATES: NOT DETECTED
TETRAHYDROCANNABINOL: NOT DETECTED

## 2016-09-16 LAB — CBC
HEMATOCRIT: 40.1 % (ref 36.0–46.0)
HEMOGLOBIN: 12.2 g/dL (ref 12.0–15.0)
MCH: 28.6 pg (ref 26.0–34.0)
MCHC: 30.4 g/dL (ref 30.0–36.0)
MCV: 94.1 fL (ref 78.0–100.0)
Platelets: 157 10*3/uL (ref 150–400)
RBC: 4.26 MIL/uL (ref 3.87–5.11)
RDW: 13.7 % (ref 11.5–15.5)
WBC: 7.1 10*3/uL (ref 4.0–10.5)

## 2016-09-16 LAB — CBG MONITORING, ED
GLUCOSE-CAPILLARY: 407 mg/dL — AB (ref 65–99)
Glucose-Capillary: >600 mg/dL (ref 65–99)
Glucose-Capillary: 594 mg/dL (ref 65–99)
Glucose-Capillary: 499 mg/dL — ABNORMAL HIGH (ref 65–99)
Glucose-Capillary: 330 mg/dL — ABNORMAL HIGH (ref 65–99)
Glucose-Capillary: 265 mg/dL — ABNORMAL HIGH (ref 65–99)

## 2016-09-16 LAB — MRSA PCR SCREENING: MRSA BY PCR: POSITIVE — AB

## 2016-09-16 LAB — TROPONIN I
TROPONIN I: 0.1 ng/mL — AB (ref <0.03)
Troponin I: <0.03 ng/mL (ref <0.03)

## 2016-09-16 LAB — GLUCOSE, CAPILLARY
Glucose-Capillary: 232 mg/dL — ABNORMAL HIGH (ref 65–99)
Glucose-Capillary: 182 mg/dL — ABNORMAL HIGH (ref 65–99)

## 2016-09-16 LAB — PHOSPHORUS: PHOSPHORUS: 2.6 mg/dL (ref 2.5–4.6)

## 2016-09-16 MED ORDER — TACROLIMUS 0.5 MG PO CAPS
0.5000 mg | ORAL_CAPSULE | Freq: Every day | ORAL | Status: DC
  Administered 2016-09-17 (×2): 0.5 mg via ORAL
  Filled 2016-09-16 (×5): qty 1

## 2016-09-16 MED ORDER — PREDNISONE 5 MG PO TABS
5.0000 mg | ORAL_TABLET | Freq: Every day | ORAL | Status: DC
  Administered 2016-09-16 – 2016-09-17 (×2): 5 mg via ORAL
  Filled 2016-09-16 (×2): qty 1

## 2016-09-16 MED ORDER — DEXTROSE-NACL 5-0.45 % IV SOLN: INTRAVENOUS | Status: DC

## 2016-09-16 MED ORDER — SODIUM CHLORIDE 0.9 % IV SOLN
INTRAVENOUS | Status: DC
  Administered 2016-09-16: 8.2 [IU]/h via INTRAVENOUS
  Filled 2016-09-16: qty 2.5

## 2016-09-16 MED ORDER — DEXTROSE-NACL 5-0.45 % IV SOLN
INTRAVENOUS | Status: DC
  Administered 2016-09-16: 21:00:00 via INTRAVENOUS

## 2016-09-16 MED ORDER — MYCOPHENOLATE MOFETIL 250 MG PO CAPS
500.0000 mg | ORAL_CAPSULE | Freq: Two times a day (BID) | ORAL | Status: DC
  Administered 2016-09-16 – 2016-09-18 (×4): 500 mg via ORAL
  Filled 2016-09-16 (×7): qty 2

## 2016-09-16 MED ORDER — FLUTICASONE PROPIONATE 50 MCG/ACT NA SUSP
1.0000 | Freq: Every day | NASAL | Status: DC
  Administered 2016-09-17: 1 via NASAL
  Filled 2016-09-16: qty 16

## 2016-09-16 MED ORDER — PROCHLORPERAZINE EDISYLATE 5 MG/ML IJ SOLN: 10.0000 mg | Freq: Four times a day (QID) | INTRAMUSCULAR | Status: DC | PRN

## 2016-09-16 MED ORDER — SODIUM CHLORIDE 0.9 % IV SOLN: INTRAVENOUS | Status: AC

## 2016-09-16 MED ORDER — ASPIRIN EC 81 MG PO TBEC
81.0000 mg | DELAYED_RELEASE_TABLET | Freq: Every day | ORAL | Status: DC
  Administered 2016-09-17 – 2016-09-18 (×2): 81 mg via ORAL
  Filled 2016-09-16 (×2): qty 1

## 2016-09-16 MED ORDER — LEVOTHYROXINE SODIUM 25 MCG PO TABS
137.0000 ug | ORAL_TABLET | Freq: Every day | ORAL | Status: DC
  Administered 2016-09-17 – 2016-09-18 (×2): 137 ug via ORAL
  Filled 2016-09-16 (×2): qty 1

## 2016-09-16 MED ORDER — ACETAMINOPHEN 325 MG PO TABS
650.0000 mg | ORAL_TABLET | Freq: Four times a day (QID) | ORAL | Status: DC | PRN
  Administered 2016-09-18: 650 mg via ORAL
  Filled 2016-09-16: qty 2

## 2016-09-16 MED ORDER — SODIUM CHLORIDE 0.9 % IV SOLN
INTRAVENOUS | Status: DC
  Administered 2016-09-16: 15:00:00 via INTRAVENOUS

## 2016-09-16 MED ORDER — LORATADINE 10 MG PO TABS
10.0000 mg | ORAL_TABLET | Freq: Every day | ORAL | Status: DC
  Administered 2016-09-17 – 2016-09-18 (×2): 10 mg via ORAL
  Filled 2016-09-16 (×2): qty 1

## 2016-09-16 MED ORDER — SODIUM CHLORIDE 0.9 % IV SOLN
INTRAVENOUS | Status: DC
  Administered 2016-09-16: 21:00:00 via INTRAVENOUS

## 2016-09-16 MED ORDER — SODIUM CHLORIDE 0.9 % IV BOLUS (SEPSIS)
1000.0000 mL | Freq: Once | INTRAVENOUS | Status: AC
  Administered 2016-09-16: 1000 mL via INTRAVENOUS

## 2016-09-16 MED ORDER — FAMOTIDINE 20 MG PO TABS
20.0000 mg | ORAL_TABLET | Freq: Every day | ORAL | Status: DC
  Administered 2016-09-16 – 2016-09-17 (×2): 20 mg via ORAL
  Filled 2016-09-16 (×3): qty 1

## 2016-09-16 MED ORDER — TACROLIMUS 0.5 MG PO CAPS: 0.5000 mg | ORAL_CAPSULE | ORAL | Status: DC

## 2016-09-16 MED ORDER — PANTOPRAZOLE SODIUM 40 MG PO TBEC: 40.0000 mg | DELAYED_RELEASE_TABLET | Freq: Every day | ORAL | Status: DC

## 2016-09-16 MED ORDER — HEPARIN SODIUM (PORCINE) 5000 UNIT/ML IJ SOLN
5000.0000 [IU] | Freq: Three times a day (TID) | INTRAMUSCULAR | Status: DC
  Administered 2016-09-16 – 2016-09-18 (×6): 5000 [IU] via SUBCUTANEOUS
  Filled 2016-09-16 (×7): qty 1

## 2016-09-16 MED ORDER — SODIUM CHLORIDE 0.9 % IV SOLN
INTRAVENOUS | Status: DC
  Administered 2016-09-16: 5.4 [IU]/h via INTRAVENOUS
  Filled 2016-09-16: qty 2.5

## 2016-09-16 MED ORDER — TACROLIMUS 1 MG PO CAPS
1.0000 mg | ORAL_CAPSULE | Freq: Every day | ORAL | Status: DC
  Administered 2016-09-16 – 2016-09-18 (×3): 1 mg via ORAL
  Filled 2016-09-16 (×4): qty 1

## 2016-09-16 NOTE — ED Notes (Signed)
Pt. Unable to urinate at this time. Will collect urine when pt.voids. Nurse aware. 

## 2016-09-16 NOTE — ED Provider Notes (Signed)
[facility]-EMERGENCY DEPT Provider Note   CSN: [id number] Arrival date & time: 09/16/16  1122   History   Chief Complaint Chief Complaint  Patient presents with  . Hyperglycemia    HPI [name] is a 51 y.o. female.  [name] Parodyamela S Lachman is a 51 y.o. F with h/o T2DM, DKA, ESRD s/p renal transplant who presents for hyperglycemia.  Patient reports despite taking insulin as prescribed, her blood glucose has been in 400-500s x3d.  She denies any CP, N/V/D, abd pain, dysuria, LE edema, orthopnea.  She does report polyuria and polydipsia.  Some SOB that feels similar to previous DKA episodes.     The history is provided by the patient. No language interpreter was used.    Past Medical History:  Diagnosis Date  . Arthritis    "elbows, knees, legs, back" (09/24/2015)  . CKD (chronic kidney disease)   . Daily headache   . Depression    "years ago"  . End stage renal disease (HCC)    right arm AV graft, post transplant  . Hypertension   . Hypothyroid   . 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 Kidney disease   . Pneumonia ~ 2007?  [name]. Type II diabetes mellitus (HCC)    Insulin dependant    Patient Active Problem List   Diagnosis Date Noted  . DKA (diabetic ketoacidoses) (HCC) 08/12/2016  . Diabetic acidosis without coma (HCC)   . DKA, type 2 (HCC) 07/21/2016  . Hypoglycemia   . Wound cellulitis   . CKD (chronic kidney disease) stage 3, GFR 30-59 ml/min 06/26/2016  . Cellulitis 06/26/2016  . Cellulitis of right lower extremity   . Hyperglycemia   . QT prolongation 05/13/2016  . UTI (lower urinary tract infection)   . Hypomagnesemia   . Pressure ulcer 04/11/2016  . Acute encephalopathy 04/11/2016  . Altered mental state 04/11/2016  . Altered mental status 04/10/2016  . Insulin dependent diabetes mellitus (HCC) 04/10/2016  . Diabetic ketoacidosis without coma associated with type 2 diabetes mellitus (HCC) 11/02/2015  . Hyperglycemia without ketosis  09/24/2015  . CKD (chronic kidney disease) stage 2, GFR 60-89 ml/min 09/24/2015  . History of renal transplant 09/24/2015  . Uncontrolled type 1 diabetes mellitus with foot ulcer (HCC) 06/04/2015  . History of renal transplantation 06/04/2015  . Foot ulcer, right (HCC)   . Foot abscess, right   . Diarrhea 06/02/2015  . DM (diabetes mellitus) type 2, uncontrolled, with ketoacidosis (HCC) 06/02/2015  . Type 1 diabetes mellitus with diabetic foot ulcer (HCC) 06/02/2015  . Acute renal failure superimposed on stage 3 chronic kidney disease (HCC) 06/02/2015  . Diabetic foot infection (HCC) 06/01/2015  . Essential hypertension 06/01/2015  . GERD (gastroesophageal reflux disease) 06/01/2015  . Hypotension 03/22/2014  . End stage renal disease (HCC) 10/24/2012  . H/O kidney transplant 08/04/2012  . High anion gap metabolic acidosis 08/04/2012  . Sepsis (HCC) 08/04/2012  . DKA, type 1 (HCC) 09/22/2011  . Hyperkalemia 09/22/2011  . Hyponatremia 09/22/2011  . Immunosuppression (HCC)   . Hypothyroidism     Past Surgical History:  Procedure Laterality Date  . AMPUTATION Left 05/11/2014   Procedure: AMPUTATION LEFT GREAT TOE;  Surgeon: Kathryne Hitchhristopher Y Blackman, MD;  Location: [facility]S;  Service: Orthopedics;  Laterality: Left;  . AV FISTULA PLACEMENT Right    forearm  . BACK SURGERY    . CATARACT EXTRACTION W/ INTRAOCULAR LENS  IMPLANT, BILATERAL Bilateral   . CESAREAN SECTION  07/1999  .  DG AV DIALYSIS GRAFT DECLOT OR    . INCISION AND DRAINAGE Right 06/02/2015   Procedure: INCISION AND DRAINAGE OF RIGHT 3rd RAY RESECTION;  Surgeon: Kathryne Hitch, MD;  Location: [facility] OR;  Service: Orthopedics;  Laterality: Right;  . 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 LUMBAR DISC SURGERY  2001  . NEPHRECTOMY TRANSPLANTED ORGAN    . TOE AMPUTATION Right    1,2 & 3rd toes.  . TUBAL LIGATION  07/1999    OB History    No data available       Home Medications    Prior to Admission medications    Medication Sig Start Date End Date Taking? Authorizing Provider  acetaminophen (TYLENOL) 325 MG tablet Take 2 tablets (650 mg total) by mouth every 6 (six) hours as needed for mild pain, moderate pain, fever or headache. 05/15/16   Elease Etienne, MD  amLODipine (NORVASC) 10 MG tablet Take 1 tablet (10 mg total) by mouth daily. 08/14/16   Belkys A Regalado, MD  amoxicillin-clavulanate (AUGMENTIN) 500-125 MG tablet Take 1 tablet (500 mg total) by mouth 2 (two) times daily. 08/14/16   Belkys A Regalado, MD  aspirin EC 81 MG tablet Take 81 mg by mouth daily.    Historical Provider, MD  carvedilol (COREG) 6.25 MG tablet Take 1 tablet (6.25 mg total) by mouth 2 (two) times daily. Patient taking differently: Take 25 mg by mouth 2 (two) times daily.  06/30/16   Rodolph Bong, MD  fluticasone (FLONASE) 50 MCG/ACT nasal spray Place 1 spray into both nostrils daily. 08/14/16   Belkys A Regalado, MD  HYDROcodone-acetaminophen (NORCO/VICODIN) 5-325 MG tablet Take 1-2 tablets by mouth every 6 (six) hours as needed for severe pain or moderate pain. Patient not taking: Reported on 08/12/2016 06/30/16   Rodolph Bong, MD  insulin NPH-regular Human (NOVOLIN 70/30) (70-30) 100 UNIT/ML injection Inject 35 Units into the skin 2 (two) times daily. 08/14/16   Belkys A Regalado, MD  levothyroxine (SYNTHROID, LEVOTHROID) 137 MCG tablet Take 1 tablet (137 mcg total) by mouth daily. BRAND NAME ONLY 03/04/15   Alison Murray, MD  loratadine (CLARITIN) 10 MG tablet Take 1 tablet (10 mg total) by mouth daily. 08/14/16   Belkys A Regalado, MD  mycophenolate (CELLCEPT) 250 MG capsule Take 500 mg by mouth 2 (two) times daily.     Historical Provider, MD  predniSONE (DELTASONE) 5 MG tablet Take 5 mg by mouth at bedtime.  05/29/15   Historical Provider, MD  ranitidine (ZANTAC) 150 MG tablet Take 150 mg by mouth at bedtime.  03/27/16   Historical Provider, MD  sodium bicarbonate 650 MG tablet Take 1 tablet (650 mg total) by mouth 2  (two) times daily. 08/14/16   Belkys A Regalado, MD  SSD 1 % cream Apply 1 application topically daily.  06/10/16   Historical Provider, MD  tacrolimus (PROGRAF) 0.5 MG capsule Take 0.5-1 mg by mouth See admin instructions. Take 1 mg by mouth in the morning and take 0.5 mg by mouth at night.    Historical Provider, MD    Family History Family History  Problem Relation Age of Onset  . Emphysema Mother   . Throat cancer Mother   . COPD Mother   . Cancer Mother   . Emphysema Father   . COPD Father   . Stroke Father   . ADD / ADHD Son     Social History Social History  Substance Use Topics  .  Smoking status: Former Smoker    Packs/day: 1.00    Years: 11.00    Types: Cigarettes    Quit date: 09/21/1998  . Smokeless tobacco: Never Used  . Alcohol use No     Allergies   Ace inhibitors; Adhesive [tape]; and Lisinopril   Review of Systems Review of Systems  Constitutional: Negative for activity change, appetite change, chills, fatigue and fever.  HENT: Negative for congestion, ear pain, rhinorrhea and sore throat.   Eyes: Negative for pain and discharge.  Respiratory: Positive for shortness of breath. Negative for cough, chest tightness and wheezing.   Cardiovascular: Negative for chest pain, palpitations and leg swelling.  Gastrointestinal: Negative for abdominal pain, constipation, diarrhea, nausea and vomiting.  Endocrine: Positive for polydipsia and polyuria.  Genitourinary: Negative for dysuria, flank pain, hematuria and urgency.  Musculoskeletal: Negative for joint swelling and myalgias.  Skin: Negative for pallor and rash.  Neurological: Negative for dizziness, weakness, light-headedness and numbness.  Psychiatric/Behavioral: Negative.      Physical Exam Updated Vital Signs BP (!) 89/47 (BP Location: Left Arm)   Pulse 81   Resp 18   Ht 5\' 8"  (1.727 m)   Wt 88 kg   LMP 11/19/2011   SpO2 97%   BMI 29.50 kg/m   Physical Exam  Constitutional: She is oriented to  person, place, and time. She appears well-developed and well-nourished. No distress.  HENT:  Head: Normocephalic and atraumatic.  Right Ear: External ear normal.  Left Ear: External ear normal.  Dry MM  Eyes: Conjunctivae are normal. Pupils are equal, round, and reactive to light. Right eye exhibits no discharge. Left eye exhibits no discharge.  Neck: Neck supple.  Cardiovascular: Normal rate, regular rhythm and normal heart sounds.   No murmur heard. Pulmonary/Chest: Effort normal and breath sounds normal. No respiratory distress. She has no wheezes. She has no rales. She exhibits no tenderness.  Abdominal: Soft. Bowel sounds are normal. She exhibits no distension and no mass. There is no tenderness. There is no rebound and no guarding.  Musculoskeletal: She exhibits no edema or deformity.  Lymphadenopathy:    She has no cervical adenopathy.  Neurological: She is alert and oriented to person, place, and time.  Skin: Skin is warm and dry.  Psychiatric: She has a normal mood and affect. Her behavior is normal.  Nursing note and vitals reviewed.    ED Treatments / Results  Labs (all labs ordered are listed, but only abnormal results are displayed) Labs Reviewed  BASIC METABOLIC PANEL - Abnormal; Notable for the following:       Result Value   Sodium 126 (*)    Potassium 5.4 (*)    Chloride 93 (*)    CO2 7 (*)    Glucose, Bld 870 (*)    BUN 49 (*)    Creatinine, Ser 2.04 (*)    GFR calc non Af Amer 27 (*)    GFR calc Af Amer 32 (*)    Anion gap 26 (*)    All other components within normal limits  BLOOD GAS, VENOUS - Abnormal; Notable for the following:    pH, Ven 7.087 (*)    pCO2, Ven 19.7 (*)    Bicarbonate 5.7 (*)    Acid-base deficit 23.3 (*)    All other components within normal limits  CBG MONITORING, ED - Abnormal; Notable for the following:    Glucose-Capillary >600 (*)    All other components within normal limits  CBC  URINALYSIS,  ROUTINE W REFLEX MICROSCOPIC    I-STAT CG4 LACTIC ACID, ED    EKG  EKG Interpretation None       Radiology No results found.  Procedures Procedures (including critical care time)  Medications Ordered in ED Medications  dextrose 5 %-0.45 % sodium chloride infusion ( Intravenous Hold 09/16/16 1309)  insulin regular (NOVOLIN R,HUMULIN R) 250 Units in sodium chloride 0.9 % 250 mL (1 Units/mL) infusion (not administered)  sodium chloride 0.9 % bolus 1,000 mL (not administered)    And  0.9 %  sodium chloride infusion (not administered)     Initial Impression / Assessment and Plan / ED Course  I have reviewed the triage vital signs and the nursing notes.  Pertinent labs & imaging results that were available during my care of the patient were reviewed by me and considered in my medical decision making (see chart for details).  Clinical Course     Blood glucose 870, VBG with pH 7.087 and bicarb 5.7, BMP with K 5.4 and anion gap 26. CBC wnl and lactic acid 1.76.  Labs consistent with DKA without clear inciting illness. 2L NS bolus and Insulin drip started.  Consulted [facility] (Dr Gwenlyn Perking) who agrees to admission to SDU.  Final Clinical Impressions(s) / ED Diagnoses   Final diagnoses:  Diabetic ketoacidosis without coma associated with type 2 diabetes mellitus (HCC)    New Prescriptions New Prescriptions   No medications on file    Erasmo Downer, MD, MPH PGY-3,  [facility] 09/16/2016 1:24 PM    Erasmo Downer, MD 09/16/16 1324    Courteney Lyn Mackuen, MD 09/17/16 1134

## 2016-09-16 NOTE — ED Notes (Signed)
Patient transport to floor delayed d/t shift change.

## 2016-09-16 NOTE — ED Triage Notes (Signed)
Per EMS, pt has been having trouble regulating blood sugar x 3 days.  Pt says that her sugar has been running over 400 and she has been taking her insulin.  Pt c/o generalized weakness, dizziness, and polyuria.  Pt is oriented to situation and place.  Pt has old dialysis fistula in right arm but has been cleared for blood pressures on that side.  Pt received 975 mL bolus with EMS.  Allergic to IV tape and lisinopril.

## 2016-09-16 NOTE — ED Notes (Signed)
EDP at bedside  

## 2016-09-16 NOTE — H&P (Signed)
History and Physical    [name] RUE:[id number] DOB: [birth year] DOA: 09/16/2016  Referring Provider: Dr. Beryle Flock  PCP: Dois Davenport., MD   Patient coming from: Home  Chief Complaint: Polydipsia, polyuria, lightheadedness, hyperglycemia  HPI: [name] is a 51 y.o. female with PMH significant for status post renal transplant, chronic kidney disease stage III, hypertension, gastroesophageal reflux disease, hypothyroidism and chronic uncontrolled type 2 diabetes (insulin-dependent); who presented to the emergency department complaining of increased fatigue, dizziness, polydipsia, polyuria. Patient endorses that for the last 4 days or so her sugars at home has been running above 400 range. She denies any nausea, vomiting, abdominal pain, chest pain, fever, productive cough, headaches, focal weakness and dysuria. She endorses to be compliant with her medications (including insulin therapy), but was confused about how many units she is supposed to be using and endorses that she is not following an appropriate diet. On admission her CBG was 117, anion gap 26 and CO2 7.  ED Course: Patient received 2 L of IV fluids, started on insulin drip. [facility] has been called to admit the patient for further evaluation and management of her DKA process.  Review of Systems:  All other systems reviewed and apart from HPI, are negative.  Past Medical History:  Diagnosis Date  . Arthritis    "elbows, knees, legs, back" (09/24/2015)  . CKD (chronic kidney disease)   . Daily headache   . Depression    "years ago"  . End stage renal disease (HCC)    right arm AV graft, post transplant  . Hypertension   . Hypothyroid   . 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 Kidney disease   . Pneumonia ~ 2007?  [name] Type II diabetes mellitus (HCC)    Insulin dependant    Past Surgical History:  Procedure Laterality Date  . AMPUTATION Left 05/11/2014   Procedure: AMPUTATION  LEFT GREAT TOE;  Surgeon: Kathryne Hitch, MD;  Location: [facility] OR[facility];  Service: Orthopedics;  Laterality: Left;  . AV FISTULA PLACEMENT Right    forearm  . BACK SURGERY    . CATARACT EXTRACTION W/ INTRAOCULAR LENS  IMPLANT, BILATERAL Bilateral   . CESAREAN SECTION  07/1999  . DG AV DIALYSIS GRAFT DECLOT OR    . INCISION AND DRAINAGE Right 06/02/2015   Procedure: INCISION AND DRAINAGE OF RIGHT 3rd RAY RESECTION;  Surgeon: Kathryne Hitch, MD;  Location: [facility] OR;  Service: Orthopedics;  Laterality: Right;  . 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 LUMBAR DISC SURGERY  2001  . NEPHRECTOMY TRANSPLANTED ORGAN    . TOE AMPUTATION Right    1,2 & 3rd toes.  . TUBAL LIGATION  07/1999     reports that she quit smoking about 18 years ago. Her smoking use included Cigarettes. She has a 11.00 pack-year smoking history. She has never used smokeless tobacco. She reports that she does not drink alcohol or use drugs.  Allergies  Allergen Reactions  . Ace Inhibitors Cough  . Adhesive [Tape] Itching and Other (See Comments)    Please use paper tape  . Lisinopril Cough    Family History  Problem Relation Age of Onset  . Emphysema Mother   . Throat cancer Mother   . COPD Mother   . Cancer Mother   . Emphysema Father   . COPD Father   . Stroke Father   . ADD / ADHD Son     Prior to Admission medications  Medication Sig Start Date End Date Taking? Authorizing Provider  acetaminophen (TYLENOL) 325 MG tablet Take 2 tablets (650 mg total) by mouth every 6 (six) hours as needed for mild pain, moderate pain, fever or headache. 05/15/16   Elease EtienneAnand D Hongalgi, MD  amLODipine (NORVASC) 10 MG tablet Take 1 tablet (10 mg total) by mouth daily. 08/14/16   Belkys A Regalado, MD  aspirin EC 81 MG tablet Take 81 mg by mouth daily.    Historical Provider, MD  carvedilol (COREG) 6.25 MG tablet Take 1 tablet (6.25 mg total) by mouth 2 (two) times daily. Patient taking differently: Take 25 mg by mouth 2  (two) times daily.  06/30/16   Rodolph Bonganiel V Thompson, MD  fluticasone (FLONASE) 50 MCG/ACT nasal spray Place 1 spray into both nostrils daily. 08/14/16   Belkys A Regalado, MD  insulin NPH-regular Human (NOVOLIN 70/30) (70-30) 100 UNIT/ML injection Inject 35 Units into the skin 2 (two) times daily. 08/14/16   Belkys A Regalado, MD  levothyroxine (SYNTHROID, LEVOTHROID) 137 MCG tablet Take 1 tablet (137 mcg total) by mouth daily. BRAND NAME ONLY 03/04/15   Alison MurrayAlma M Devine, MD  loratadine (CLARITIN) 10 MG tablet Take 1 tablet (10 mg total) by mouth daily. 08/14/16   Belkys A Regalado, MD  mycophenolate (CELLCEPT) 250 MG capsule Take 500 mg by mouth 2 (two) times daily.     Historical Provider, MD  predniSONE (DELTASONE) 5 MG tablet Take 5 mg by mouth at bedtime.  05/29/15   Historical Provider, MD  ranitidine (ZANTAC) 150 MG tablet Take 150 mg by mouth at bedtime.  03/27/16   Historical Provider, MD  sodium bicarbonate 650 MG tablet Take 1 tablet (650 mg total) by mouth 2 (two) times daily. 08/14/16   Belkys A Regalado, MD  SSD 1 % cream Apply 1 application topically daily.  06/10/16   Historical Provider, MD  tacrolimus (PROGRAF) 0.5 MG capsule Take 0.5-1 mg by mouth See admin instructions. Take 1 mg by mouth in the morning and take 0.5 mg by mouth at night.    Historical Provider, MD    Physical Exam: Vitals:   09/16/16 1145 09/16/16 1146 09/16/16 1339  BP: (!) 89/47  95/59  Pulse: 81  90  Resp: 18  22  SpO2: 97%  100%  Weight:  88 kg (194 lb)   Height:  5\' 8"  (1.727 m)     Constitutional: Patient denies chest pain, no nausea or vomiting currently. Slightly ttachypneic on exam, but denies shortness of breath and is able to speak in full sentences. Patient is afebrile Eyes: PERRLA, lids and conjunctivae normal, no nystagmus; no icterus. ENMT: Mucous membranes dry on assessment. Posterior pharynx clear of any exudate or lesions. Fair dentition.  Neck: normal, supple, no masses, no thyromegalyNo bruits;    Respiratory: clear to auscultation bilaterally, no wheezing, no crackles.  Slightly tachypneic. No accessory muscle use.  Cardiovascular: S1 & S2 heard, regular rate and rhythm, no murmurs / rubs / gallops. No extremity edema. 2+ pedal pulses. No JVD Abdomen: No distension, no tenderness, no masses palpated. No hepatosplenomegaly. Bowel sounds normal.  Musculoskeletal: no clubbing / cyanosis. No joint deformity upper and lower extremities. Good ROM, no contractures. Normal muscle tone.  Skin: no rashes, No bruises or open wounds appreciated. Some stasis dermatitis and tattoos seen on her limbs Neurologic: CN 2-12 grossly intact. Sensation intact, DTR normal. Strength 4/5 in all 4 limbs Due to poor effort.  Psychiatric: Normal judgment and insight. Alert and  oriented x 3. Normal mood.   Labs on Admission: I have personally reviewed following labs and imaging studies  CBC:  Recent Labs Lab 09/16/16 1155  WBC 7.1  HGB 12.2  HCT 40.1  MCV 94.1  PLT 157   Basic Metabolic Panel:  Recent Labs Lab 09/16/16 1155  NA 126*  K 5.4*  CL 93*  CO2 7*  GLUCOSE 870*  BUN 49*  CREATININE 2.04*  CALCIUM 9.0   GFR: Estimated Creatinine Clearance: 38.7 mL/min (by C-G formula based on SCr of 2.04 mg/dL (H)).  CBG:  Recent Labs Lab 09/16/16 1231 09/16/16 1432  GLUCAP >600* >600*   Urine analysis:    Component Value Date/Time   COLORURINE YELLOW 09/16/2016 1333   APPEARANCEUR CLEAR 09/16/2016 1333   LABSPEC 1.021 09/16/2016 1333   PHURINE 5.0 09/16/2016 1333   GLUCOSEU >=500 (A) 09/16/2016 1333   HGBUR SMALL (A) 09/16/2016 1333   BILIRUBINUR NEGATIVE 09/16/2016 1333   KETONESUR 80 (A) 09/16/2016 1333   PROTEINUR 100 (A) 09/16/2016 1333   UROBILINOGEN 0.2 03/02/2015 1153   NITRITE NEGATIVE 09/16/2016 1333   LEUKOCYTESUR NEGATIVE 09/16/2016 1333    Radiological Exams on Admission: No results found.  EKG:  No acute ischemic changes. Sinus tachycardia appreciated.    Assessment/Plan 1-DM (diabetes mellitus) type 2, uncontrolled, with ketoacidosis (HCC): Appears to be secondary to dietary indiscretion and medication noncompliance. Patient without chest pain, no ischemic changes on EKG and no acute source of infection appreciated. -Patient will be admitted to step down -Anion gap 26, CO2 7, CBG 870  -Initiate treatment with insulin drip and aggressive fluid resuscitation -Will follow and adjust/replete electrolytes as needed -Will check UDS and troponin -most recent A1C 12 (12/17)  2-Acute renal failure superimposed on stage 3 chronic kidney disease (HCC) -Most likely secondary to prerenal azotemia -Will continue fluid resuscitation and minimize the use of nephrotoxic agents -Will follow renal function trend -Patient creatinine at baseline around 1.5  3-Hypothyroidism: -We will continue Synthroid   4-Hyperkalemia: -Due to DKA and acute on chronic renal failure -At this particular moment that is no abnormalities seen on EKG, will hold on Kayexalate or any other treatment for hyperkalemia -follow BMET -anticipate correction with insulin therapy -will check his phosphorus and magnesium level  5-Hyponatremia: Appears to be pseudohyponatremia secondary to hyperglycemia -Will follow electrolytes trend -Continue IV fluid resuscitation  6-H/O kidney transplant -Will continue prednisone, CellCept and Prograf -Will follow renal function trend -once DKA resolved, will resume daily bicarb therapy  7-Essential hypertension -Patient currently hypotensive -Will hold on antihypertensive agents and follow vital signs -Resume blood pressure medications when appropriate  8-GERD (gastroesophageal reflux disease) -will use pepcid     Time: 70 minutes   DVT prophylaxis: Heparin Code Status: Full Family Communication: no family at bedside  Disposition Plan: home when medically stable; most likely in 3-4 days Consults called: none  Admission status:  inpatient, stepdown, LOS > 2 midnights    Vassie Loll MD [facility] Pager [phone]  If 7PM-7AM, please contact night-coverage [email or web address] Password [email or web address]  09/16/2016, 3:24 PM

## 2016-09-16 NOTE — ED Notes (Signed)
Bed: WA21 Expected date:  Expected time:  Means of arrival:  Comments: EMS hyperglycemia 

## 2016-09-17 DIAGNOSIS — E039 Hypothyroidism, unspecified: Secondary | ICD-10-CM

## 2016-09-17 DIAGNOSIS — K219 Gastro-esophageal reflux disease without esophagitis: Secondary | ICD-10-CM

## 2016-09-17 DIAGNOSIS — Z940 Kidney transplant status: Secondary | ICD-10-CM

## 2016-09-17 DIAGNOSIS — I10 Essential (primary) hypertension: Secondary | ICD-10-CM

## 2016-09-17 DIAGNOSIS — E871 Hypo-osmolality and hyponatremia: Secondary | ICD-10-CM

## 2016-09-17 DIAGNOSIS — N179 Acute kidney failure, unspecified: Secondary | ICD-10-CM

## 2016-09-17 DIAGNOSIS — E875 Hyperkalemia: Secondary | ICD-10-CM

## 2016-09-17 DIAGNOSIS — E1310 Other specified diabetes mellitus with ketoacidosis without coma: Secondary | ICD-10-CM

## 2016-09-17 DIAGNOSIS — N183 Chronic kidney disease, stage 3 (moderate): Secondary | ICD-10-CM

## 2016-09-17 DIAGNOSIS — Z794 Long term (current) use of insulin: Secondary | ICD-10-CM

## 2016-09-17 LAB — BASIC METABOLIC PANEL
ANION GAP: 7 (ref 5–15)
ANION GAP: 10 (ref 5–15)
Anion gap: 7 (ref 5–15)
Anion gap: 7 (ref 5–15)
Anion gap: 8 (ref 5–15)
BUN: 42 mg/dL — AB (ref 6–20)
BUN: 39 mg/dL — ABNORMAL HIGH (ref 6–20)
BUN: 38 mg/dL — AB (ref 6–20)
BUN: 33 mg/dL — ABNORMAL HIGH (ref 6–20)
BUN: 32 mg/dL — ABNORMAL HIGH (ref 6–20)
CALCIUM: 8.9 mg/dL (ref 8.9–10.3)
CALCIUM: 9.2 mg/dL (ref 8.9–10.3)
CALCIUM: 9.3 mg/dL (ref 8.9–10.3)
CO2: 21 mmol/L — AB (ref 22–32)
CO2: 20 mmol/L — AB (ref 22–32)
CO2: 19 mmol/L — AB (ref 22–32)
CO2: 21 mmol/L — ABNORMAL LOW (ref 22–32)
CO2: 17 mmol/L — ABNORMAL LOW (ref 22–32)
CREATININE: 1.43 mg/dL — AB (ref 0.44–1.00)
CREATININE: 1.31 mg/dL — AB (ref 0.44–1.00)
CREATININE: 1.35 mg/dL — AB (ref 0.44–1.00)
Calcium: 8.9 mg/dL (ref 8.9–10.3)
Calcium: 8.9 mg/dL (ref 8.9–10.3)
Chloride: 107 mmol/L (ref 101–111)
Chloride: 105 mmol/L (ref 101–111)
Chloride: 104 mmol/L (ref 101–111)
Chloride: 106 mmol/L (ref 101–111)
Chloride: 106 mmol/L (ref 101–111)
Creatinine, Ser: 1.33 mg/dL — ABNORMAL HIGH (ref 0.44–1.00)
Creatinine, Ser: 1.35 mg/dL — ABNORMAL HIGH (ref 0.44–1.00)
GFR calc non Af Amer: 42 mL/min — ABNORMAL LOW (ref >60)
GFR calc non Af Amer: 47 mL/min — ABNORMAL LOW (ref >60)
GFR calc non Af Amer: 45 mL/min — ABNORMAL LOW (ref >60)
GFR, EST AFRICAN AMERICAN: 49 mL/min — AB (ref >60)
GFR, EST AFRICAN AMERICAN: 54 mL/min — AB (ref >60)
GFR, EST AFRICAN AMERICAN: 52 mL/min — AB (ref >60)
GFR, EST AFRICAN AMERICAN: 53 mL/min — AB (ref >60)
GFR, EST AFRICAN AMERICAN: 52 mL/min — AB (ref >60)
GFR, EST NON AFRICAN AMERICAN: 46 mL/min — AB (ref >60)
GFR, EST NON AFRICAN AMERICAN: 45 mL/min — AB (ref >60)
GLUCOSE: 180 mg/dL — AB (ref 65–99)
GLUCOSE: 294 mg/dL — AB (ref 65–99)
Glucose, Bld: 140 mg/dL — ABNORMAL HIGH (ref 65–99)
Glucose, Bld: 127 mg/dL — ABNORMAL HIGH (ref 65–99)
Glucose, Bld: 201 mg/dL — ABNORMAL HIGH (ref 65–99)
Potassium: 3.7 mmol/L (ref 3.5–5.1)
Potassium: 3.7 mmol/L (ref 3.5–5.1)
Potassium: 3.7 mmol/L (ref 3.5–5.1)
Potassium: 3.6 mmol/L (ref 3.5–5.1)
Potassium: 3.7 mmol/L (ref 3.5–5.1)
SODIUM: 135 mmol/L (ref 135–145)
SODIUM: 132 mmol/L — AB (ref 135–145)
SODIUM: 133 mmol/L — AB (ref 135–145)
Sodium: 131 mmol/L — ABNORMAL LOW (ref 135–145)
Sodium: 134 mmol/L — ABNORMAL LOW (ref 135–145)

## 2016-09-17 LAB — MAGNESIUM: Magnesium: 1.6 mg/dL — ABNORMAL LOW (ref 1.7–2.4)

## 2016-09-17 LAB — GLUCOSE, CAPILLARY
GLUCOSE-CAPILLARY: 128 mg/dL — AB (ref 65–99)
GLUCOSE-CAPILLARY: 131 mg/dL — AB (ref 65–99)
GLUCOSE-CAPILLARY: 143 mg/dL — AB (ref 65–99)
GLUCOSE-CAPILLARY: 151 mg/dL — AB (ref 65–99)
GLUCOSE-CAPILLARY: 170 mg/dL — AB (ref 65–99)
GLUCOSE-CAPILLARY: 176 mg/dL — AB (ref 65–99)
GLUCOSE-CAPILLARY: 176 mg/dL — AB (ref 65–99)
GLUCOSE-CAPILLARY: 205 mg/dL — AB (ref 65–99)
Glucose-Capillary: 119 mg/dL — ABNORMAL HIGH (ref 65–99)
Glucose-Capillary: 138 mg/dL — ABNORMAL HIGH (ref 65–99)
Glucose-Capillary: 174 mg/dL — ABNORMAL HIGH (ref 65–99)
Glucose-Capillary: 195 mg/dL — ABNORMAL HIGH (ref 65–99)
Glucose-Capillary: 160 mg/dL — ABNORMAL HIGH (ref 65–99)
Glucose-Capillary: 184 mg/dL — ABNORMAL HIGH (ref 65–99)
Glucose-Capillary: 250 mg/dL — ABNORMAL HIGH (ref 65–99)
Glucose-Capillary: 355 mg/dL — ABNORMAL HIGH (ref 65–99)

## 2016-09-17 LAB — TROPONIN I
TROPONIN I: 0.12 ng/mL — AB (ref <0.03)
Troponin I: 0.2 ng/mL (ref <0.03)
Troponin I: 0.16 ng/mL (ref <0.03)

## 2016-09-17 MED ORDER — INSULIN ASPART PROT & ASPART (70-30 MIX) 100 UNIT/ML ~~LOC~~ SUSP
35.0000 [IU] | Freq: Two times a day (BID) | SUBCUTANEOUS | Status: DC
  Administered 2016-09-17 (×2): 35 [IU] via SUBCUTANEOUS
  Filled 2016-09-17: qty 10

## 2016-09-17 MED ORDER — INSULIN ASPART 100 UNIT/ML ~~LOC~~ SOLN
0.0000 [IU] | Freq: Every day | SUBCUTANEOUS | Status: DC
  Administered 2016-09-17: 5 [IU] via SUBCUTANEOUS

## 2016-09-17 MED ORDER — INSULIN ASPART 100 UNIT/ML ~~LOC~~ SOLN
0.0000 [IU] | Freq: Three times a day (TID) | SUBCUTANEOUS | Status: DC
  Administered 2016-09-17: 5 [IU] via SUBCUTANEOUS
  Administered 2016-09-18: 11 [IU] via SUBCUTANEOUS
  Administered 2016-09-18: 5 [IU] via SUBCUTANEOUS
  Administered 2016-09-18: 3 [IU] via SUBCUTANEOUS

## 2016-09-17 MED ORDER — SODIUM CHLORIDE 0.9 % IV SOLN: INTRAVENOUS | Status: DC

## 2016-09-17 MED ORDER — CARVEDILOL 6.25 MG PO TABS
6.2500 mg | ORAL_TABLET | Freq: Two times a day (BID) | ORAL | Status: DC
  Administered 2016-09-17 – 2016-09-18 (×3): 6.25 mg via ORAL
  Filled 2016-09-17 (×3): qty 1

## 2016-09-17 MED ORDER — SODIUM CHLORIDE 0.9 % IV SOLN
INTRAVENOUS | Status: DC
  Administered 2016-09-17: 14:00:00 via INTRAVENOUS

## 2016-09-17 MED ORDER — INSULIN GLARGINE 100 UNIT/ML ~~LOC~~ SOLN
10.0000 [IU] | Freq: Every day | SUBCUTANEOUS | Status: DC
  Filled 2016-09-17: qty 0.1

## 2016-09-17 NOTE — Progress Notes (Signed)
PROGRESS NOTE    [name] Parodyamela S Nevin  ZOX:[id number] DOB: 1966/04/11 DOA: 09/16/2016 PCP: Dois DavenportICHTER,KAREN L., MD   Brief Narrative: [name] is a 51 y.o. with a history of renal transplant, chronic kidney disease stage III, hypertension, GERD, hypothyroidism, insulin-dependent uncontrolled type 2 diabetes. She presented to the emergency department and was found to be in DKA. She was started on glucose stabilizer and she has been transitioned to subcutaneous insulin   Assessment & Plan:   Principal Problem:   DKA (diabetic ketoacidoses) (HCC) Active Problems:   Hypothyroidism   Hyperkalemia   Hyponatremia   H/O kidney transplant   Essential hypertension   GERD (gastroesophageal reflux disease)   DM (diabetes mellitus) type 2, uncontrolled, with ketoacidosis (HCC)   Acute renal failure superimposed on stage 3 chronic kidney disease (HCC)   DKA Patient reports missing two doses of insulin only. No overt illness recently. Anion gap closed. -transition to subcutaneous insulin: home NPH-regular 70/30 35 units BID with meals -SSI  Diabetes mellitus Uncontrolled. Hemoglobin A1C of 12.5 one month ago. -restarting home regimen as above -SSI  Acute renal failure on CKD 3 Improved with fluid resuscitation. Currently at baseline  Hypothyroidism -continue home synthroid  Hyperkalemia Resolved  Hyponatremia Corrected to 132. Anticipate improvement with diet - repeat BMP in the morning  History of kidney transplant -Continue prednisone -Continue CellCept -Continue Prograf -Restart bicarbonate  Essential hypertension -Restart home Coreg at 6.25 mg twice a day  GERD -Continue Pepcid  Elevated troponin No chest pain. Initial EKG significant for no evidence of ACS. -Trend troponin  DVT prophylaxis: Heparin subcutaneous  Code Status: Full code Family Communication: None at bedside Disposition Plan: Anticipate discharge home in 1-2 days. Transfer to medical  floor   Consultants:   None   Procedures:  None   Antimicrobials:  None    Subjective: Patient reports no polydipsia, no nausea, vomiting. No chest pain today. No dyspnea. She would like to start eating.  Objective: Vitals:   09/17/16 1035 09/17/16 1100 09/17/16 1200 09/17/16 1300  BP: (!) 156/80     Pulse: 88 92 86 86  Resp: 17     Temp:    97.9 F (36.6 C)  TempSrc:      SpO2: 98% 99% 99% 97%  Weight:      Height:        Intake/Output Summary (Last 24 hours) at 09/17/16 1356 Last data filed at 09/17/16 1000  Gross per 24 hour  Intake          2579.61 ml  Output              260 ml  Net          2319.61 ml   Filed Weights   09/16/16 1146  Weight: 88 kg (194 lb)    Examination:  General exam: Appears calm and comfortable Respiratory system: Clear to auscultation. Respiratory effort normal. Cardiovascular system: S1 & S2 heard, RRR. No murmurs, rubs, gallops or clicks. Gastrointestinal system: Abdomen is nondistended, soft and nontender. Normal bowel sounds heard. Central nervous system: Alert and oriented. No focal neurological deficits. Extremities: No edema. No calf tenderness Skin: No cyanosis. No rashes Psychiatry: Judgement and insight appear normal. Mood & affect appropriate.     Data Reviewed: I have personally reviewed following labs and imaging studies  CBC:  Recent Labs Lab 09/16/16 1155  WBC 7.1  HGB 12.2  HCT 40.1  MCV 94.1  PLT 157   Basic Metabolic Panel:  Recent Labs Lab 09/16/16 1155 09/16/16 2118 09/17/16 0101 09/17/16 0317 09/17/16 0859  NA 126* 134* 135 132* 131*  K 5.4* 3.7 3.7 3.7 3.7  CL 93* 106 107 105 104  CO2 7* 18* 21* 20* 19*  GLUCOSE 870* 243* 140* 127* 201*  BUN 49* 43* 42* 39* 38*  CREATININE 2.04* 1.65* 1.43* 1.31* 1.35*  CALCIUM 9.0 9.1 8.9 8.9 8.9  MG  --   --   --  1.6*  --   PHOS  --  2.6  --   --   --    GFR: Estimated Creatinine Clearance: 58.5 mL/min (by C-G formula based on SCr of 1.35  mg/dL (H)). Liver Function Tests: No results for input(s): AST, ALT, ALKPHOS, BILITOT, PROT, ALBUMIN in the last 168 hours. No results for input(s): LIPASE, AMYLASE in the last 168 hours. No results for input(s): AMMONIA in the last 168 hours. Coagulation Profile: No results for input(s): INR, PROTIME in the last 168 hours. Cardiac Enzymes:  Recent Labs Lab 09/16/16 1548 09/16/16 2118 09/17/16 0317 09/17/16 0859  TROPONINI <0.03 0.10* 0.20* 0.16*   BNP (last 3 results) No results for input(s): PROBNP in the last 8760 hours. HbA1C: No results for input(s): HGBA1C in the last 72 hours. CBG:  Recent Labs Lab 09/17/16 0644 09/17/16 0741 09/17/16 0851 09/17/16 1000 09/17/16 1103  GLUCAP 174* 176* 176* 205* 195*   Lipid Profile: No results for input(s): CHOL, HDL, LDLCALC, TRIG, CHOLHDL, LDLDIRECT in the last 72 hours. Thyroid Function Tests: No results for input(s): TSH, T4TOTAL, FREET4, T3FREE, THYROIDAB in the last 72 hours. Anemia Panel: No results for input(s): VITAMINB12, FOLATE, FERRITIN, TIBC, IRON, RETICCTPCT in the last 72 hours. Sepsis Labs:  Recent Labs Lab 09/16/16 1243 09/16/16 1600  LATICACIDVEN 1.76 2.06*    Recent Results (from the past 240 hour(s))  MRSA PCR Screening     Status: Abnormal   Collection Time: 09/16/16  9:20 PM  Result Value Ref Range Status   MRSA by PCR POSITIVE (A) NEGATIVE Final    Comment:        The GeneXpert MRSA Assay (FDA approved for NASAL specimens only), is one component of a comprehensive MRSA colonization surveillance program. It is not intended to diagnose MRSA infection nor to guide or monitor treatment for MRSA infections. RESULT CALLED TO, READ BACK BY AND VERIFIED WITH: Manuela Schwartz [id number] @ 2217 BY J SCOTTON          Radiology Studies: No results found.      Scheduled Meds: . aspirin EC  81 mg Oral Daily  . famotidine  20 mg Oral QHS  . fluticasone  1 spray Each Nare Daily  .  heparin  5,000 Units Subcutaneous Q8H  . insulin aspart  0-15 Units Subcutaneous TID WC  . insulin aspart  0-5 Units Subcutaneous QHS  . insulin aspart protamine- aspart  35 Units Subcutaneous BID WC  . levothyroxine  137 mcg Oral QAC breakfast  . loratadine  10 mg Oral Daily  . mycophenolate  500 mg Oral BID  . predniSONE  5 mg Oral QHS  . tacrolimus  1 mg Oral Daily   And  . tacrolimus  0.5 mg Oral QHS   Continuous Infusions: . sodium chloride       LOS: 1 day     Jacquelin Hawking [facility] 09/17/2016, 1:56 PM Pager: [phone]  If 7PM-7AM, please contact night-coverage [email or web address] Password [email or web address] 09/17/2016, 1:56 PM

## 2016-09-17 NOTE — Progress Notes (Signed)
Inpatient Diabetes Program Recommendations  AACE/ADA: New Consensus Statement on Inpatient Glycemic Control (2015)  Target Ranges:  Prepandial:   less than 140 mg/dL      Peak postprandial:   less than 180 mg/dL (1-2 hours)      Critically ill patients:  140 - 180 mg/dL   Lab Results  Component Value Date   GLUCAP 205 (H) 09/17/2016   HGBA1C 12.5 (H) 08/12/2016    Review of Glycemic Control  Diabetes history: DM2 Outpatient Diabetes medications: Novolin 70/30 35 units bid Current orders for Inpatient glycemic control: IV insulin per DKA order set  Not ready for transition at present.  Inpatient Diabetes Program Recommendations:    When criteria met for transitioning to SQ insulin, give Lantus 2 hours prior to discontinuation of drip. Lantus 22 units Q24H Novolog sensitive tidwc and HS Novolog 6 units tidwc for meal coverage insulin if pt eating > 50% meal  Patient is followed by Dr. Kennith Gain with [facility] from DM management. Patient reports per previous admission that she is taking her insulin as prescribed. Patient states that they check their glucose 2 times per day and that it is sometimes "high" and sometimes "low". To speak with pt later today regarding glycemic control at home.   Will continue to follow. Thank you. Lorenda Peck, RD, LDN, CDE Inpatient Diabetes Coordinator [phone]

## 2016-09-17 NOTE — Progress Notes (Signed)
CRITICAL VALUE ALERT  Critical value received:  yes  Date of notification:  09/17/16  Time of notification:  04:30  Critical value read back:yes  Nurse who received alert:  Talyn Dessert  MD notified (1st page):  yes  Time of first page:  04:32

## 2016-09-18 ENCOUNTER — Encounter (HOSPITAL_BASED_OUTPATIENT_CLINIC_OR_DEPARTMENT_OTHER): Payer: Medicare Other | Attending: Internal Medicine

## 2016-09-18 LAB — BASIC METABOLIC PANEL
ANION GAP: 6 (ref 5–15)
BUN: 26 mg/dL — ABNORMAL HIGH (ref 6–20)
CALCIUM: 9.2 mg/dL (ref 8.9–10.3)
CO2: 20 mmol/L — ABNORMAL LOW (ref 22–32)
CREATININE: 1.14 mg/dL — AB (ref 0.44–1.00)
Chloride: 111 mmol/L (ref 101–111)
GFR, EST NON AFRICAN AMERICAN: 56 mL/min — AB (ref >60)
Glucose, Bld: 287 mg/dL — ABNORMAL HIGH (ref 65–99)
Potassium: 4.1 mmol/L (ref 3.5–5.1)
Sodium: 137 mmol/L (ref 135–145)

## 2016-09-18 LAB — GLUCOSE, CAPILLARY
GLUCOSE-CAPILLARY: 335 mg/dL — AB (ref 65–99)
Glucose-Capillary: 228 mg/dL — ABNORMAL HIGH (ref 65–99)
Glucose-Capillary: 155 mg/dL — ABNORMAL HIGH (ref 65–99)

## 2016-09-18 MED ORDER — INSULIN NPH ISOPHANE & REGULAR (70-30) 100 UNIT/ML ~~LOC~~ SUSP: 40.0000 [IU] | Freq: Two times a day (BID) | SUBCUTANEOUS | 2 refills | Status: DC

## 2016-09-18 MED ORDER — INSULIN ASPART PROT & ASPART (70-30 MIX) 100 UNIT/ML ~~LOC~~ SUSP
40.0000 [IU] | Freq: Two times a day (BID) | SUBCUTANEOUS | Status: DC
  Administered 2016-09-18: 40 [IU] via SUBCUTANEOUS

## 2016-09-18 MED ORDER — AMLODIPINE BESYLATE 10 MG PO TABS
10.0000 mg | ORAL_TABLET | Freq: Every day | ORAL | Status: DC
  Administered 2016-09-18: 10 mg via ORAL
  Filled 2016-09-18: qty 1

## 2016-09-18 NOTE — Discharge Instructions (Signed)
[name] ParodyPamela S Kats,  You were admitted for diabetic ketoacidosis. Your insulin has been adjusted in your given IV fluids to help with dehydration. Please follow-up with her primary care physician for continued management of your diabetes. If you have symptoms of increasing desire to drink fluids, increased urination, please recheck your blood sugar, and if significantly elevated, return for reevaluation.

## 2016-09-18 NOTE — Consult Note (Signed)
   [facility]   09/18/2016  Exie Parodyamela S Lollis [birth year] [id number]    [facility]. Multiple attempts have been made to contact Mrs. Ron by [facility] telephonic RNCM prior to admission and case was closed due to being unable to contact.   Spoke with Mrs.Nakanishi at bedside to ask her about actually engaging with [facility] services. She endorses that she is familiar with the program and the calls she has been receiving and endorses she has not responded to attempts of being reached because " I just do not think I need you guys. I know I have been coming in a lot but I am what you call a brittle diabetic. I take my medications like I am supposed to. [name]". Asked patient about her frequent admissions and she states it is because her "sugars are up and down and I do not think there is anything you can do about that". She adamantly but pleasantly declines [facility] services. Denies having issues with medications and transportation. Denies any community resources needs.  Mrs. [name] states she has all of the contact information to call if needed. Will send this to [facility] office so case can reflect being closed as Mrs. [name] refuses [facility] services. Will make inpatient RNCM aware as well.    Raiford NobleAtika Hall, MSN-Ed, RN,BSN [facility] Hospital Liaison [phone]

## 2016-09-18 NOTE — Progress Notes (Signed)
Discharge instructions given to pt, verbalized understanding. Left the unit in stable condition. 

## 2016-09-18 NOTE — Discharge Summary (Signed)
Physician Discharge Summary  [name] ZOX:[id number] DOB: 05-Jun-1966 DOA: 09/16/2016  PCP: Dois Davenport., MD  Admit date: 09/16/2016 Discharge date: 09/18/2016  Admitted From: Home Disposition:  Home  Recommendations for Outpatient Follow-up:  1. Follow up with PCP in 1-2 weeks 2. Titrate insulin for optimal blood glucose control. Increased to NPH-regular 70/30 40 units BID 3. BMP to recheck potassium and sodium   Discharge Condition: Stable CODE STATUS: Full code Diet recommendation: Carb modified   Brief/Interim Summary:  HPI written by Donald Pore, MD on 09/16/2016  Chief Complaint: Polydipsia, polyuria, lightheadedness, hyperglycemia  HPI: [name] is a 51 y.o. female with PMH significant for status post renal transplant, chronic kidney disease stage III, hypertension, gastroesophageal reflux disease, hypothyroidism and chronic uncontrolled type 2 diabetes (insulin-dependent); who presented to the emergency department complaining of increased fatigue, dizziness, polydipsia, polyuria. Patient endorses that for the last 4 days or so her sugars at home has been running above 400 range. She denies any nausea, vomiting, abdominal pain, chest pain, fever, productive cough, headaches, focal weakness and dysuria. She endorses to be compliant with her medications (including insulin therapy), but was confused about how many units she is supposed to be using and endorses that she is not following an appropriate diet. On admission her CBG was 117, anion gap 26 and CO2 7.   Hospital course:  Diabetes ketoacidosis Appears precipitated by non-adherence with regimen. Patient started on IV insulin drip to improve DKA. She was eventually transitioned to subcutaneous insulin of NPH-regular 70-30 at 35 units which was increased to 40 units before discharge.  Diabetes mellitus Uncontrolled. A1C of 12.5 one month prior to admission. Management as above.  Acute renal failure on  CKD 3 Secondary to dehydration from DKA. Improved with fluids.  Hypothyroidism Continued synthroid  Hyperkalemia Resolved with fluids  History of kidney transplant Continued prednisone, CellCept and Prograf. Bicarb initially held but restarted when DKA resolved.  Essential hypertension Restarted home medication. Coreg restarted at 6.25mg  BID as prescribed.  GERD Continued Pepcid  Elevated troponin No chest pain. EKG significant for no evidence of ACS. Likely demand ischemia. Decreased.  Discharge Diagnoses:  Principal Problem:   DKA (diabetic ketoacidoses) (HCC) Active Problems:   Hypothyroidism   Hyperkalemia   Hyponatremia   H/O kidney transplant   Essential hypertension   GERD (gastroesophageal reflux disease)   DM (diabetes mellitus) type 2, uncontrolled, with ketoacidosis (HCC)   Acute renal failure superimposed on stage 3 chronic kidney disease (HCC)    Discharge Instructions   Allergies as of 09/18/2016      Reactions   Ace Inhibitors Cough   Adhesive [tape] Itching, Other (See Comments)   Please use paper tape   Lisinopril Cough      Medication List    TAKE these medications   acetaminophen 325 MG tablet Commonly known as:  TYLENOL Take 2 tablets (650 mg total) by mouth every 6 (six) hours as needed for mild pain, moderate pain, fever or headache.   amLODipine 10 MG tablet Commonly known as:  NORVASC Take 1 tablet (10 mg total) by mouth daily.   aspirin EC 81 MG tablet Take 81 mg by mouth daily.   carvedilol 6.25 MG tablet Commonly known as:  COREG Take 1 tablet (6.25 mg total) by mouth 2 (two) times daily. What changed:  how much to take   fluticasone 50 MCG/ACT nasal spray Commonly known as:  FLONASE Place 1 spray into both nostrils daily.   insulin NPH-regular  Human (70-30) 100 UNIT/ML injection Commonly known as:  NOVOLIN 70/30 Inject 40 Units into the skin 2 (two) times daily. What changed:  how much to take   levothyroxine 137  MCG tablet Commonly known as:  SYNTHROID, LEVOTHROID Take 1 tablet (137 mcg total) by mouth daily. BRAND NAME ONLY   loratadine 10 MG tablet Commonly known as:  CLARITIN Take 1 tablet (10 mg total) by mouth daily.   mycophenolate 250 MG capsule Commonly known as:  CELLCEPT Take 500 mg by mouth 2 (two) times daily.   predniSONE 5 MG tablet Commonly known as:  DELTASONE Take 5 mg by mouth at bedtime.   ranitidine 150 MG tablet Commonly known as:  ZANTAC Take 150 mg by mouth at bedtime.   SSD 1 % cream Generic drug:  silver sulfADIAZINE Apply 1 application topically daily.   tacrolimus 0.5 MG capsule Commonly known as:  PROGRAF Take 0.5-1 mg by mouth See admin instructions. Take 1 mg by mouth in the morning and take 0.5 mg by mouth at night.      Follow-up Information    Dois DavenportICHTER,KAREN L., MD. Schedule an appointment as soon as possible for a visit in 1 week(s).   Specialty:  Family Medicine Why:  Hospital follow-up Contact information: [address] [place] [address] [phone]          Allergies  Allergen Reactions  . Ace Inhibitors Cough  . Adhesive [Tape] Itching and Other (See Comments)    Please use paper tape  . Lisinopril Cough    Consultations:  None   Procedures/Studies:  No results found.   Subjective: Patient reports no issues overnight. No nausea or vomiting.  Discharge Exam: Vitals:   09/18/16 0603 09/18/16 1420  BP: (!) 191/98 (!) 162/87  Pulse: 73 84  Resp: 17 16  Temp: 97.4 F (36.3 C) 98.3 F (36.8 C)   Vitals:   09/17/16 1700 09/17/16 2115 09/18/16 0603 09/18/16 1420  BP: (!) 171/83 (!) 167/88 (!) 191/98 (!) 162/87  Pulse: 82 81 73 84  Resp:  17 17 16   Temp: 97.8 F (36.6 C) 97.8 F (36.6 C) 97.4 F (36.3 C) 98.3 F (36.8 C)  TempSrc: Oral Oral Oral Oral  SpO2: 100% 100% 97% 100%  Weight:      Height:        General exam: Appears calm and comfortable Respiratory system: Clear to auscultation.  Respiratory effort normal. Cardiovascular system: S1 & S2 heard, RRR. No murmurs, rubs, gallops or clicks. Gastrointestinal system: Abdomen is nondistended, soft and nontender. Normal bowel sounds heard. Central nervous system: Alert and oriented. No focal neurological deficits. Extremities: No edema. No calf tenderness Skin: No cyanosis. No rashes Psychiatry: Judgement and insight appear normal. Mood & affect appropriate.    The results of significant diagnostics from this hospitalization (including imaging, microbiology, ancillary and laboratory) are listed below for reference.     Microbiology: Recent Results (from the past 240 hour(s))  MRSA PCR Screening     Status: Abnormal   Collection Time: 09/16/16  9:20 PM  Result Value Ref Range Status   MRSA by PCR POSITIVE (A) NEGATIVE Final    Comment:        The GeneXpert MRSA Assay (FDA approved for NASAL specimens only), is one component of a comprehensive MRSA colonization surveillance program. It is not intended to diagnose MRSA infection nor to guide or monitor treatment for MRSA infections. RESULT CALLED TO, READ BACK BY AND VERIFIED WITH: PATRICK DICKINSON,RN  [id number] @ 2217 BY J SCOTTON      Labs: BNP (last 3 results) No results for input(s): BNP in the last 8760 hours. Basic Metabolic Panel:  Recent Labs Lab 09/16/16 2118  09/17/16 0317 09/17/16 0859 09/17/16 1316 09/17/16 1657 09/18/16 0427  NA 134*  < > 132* 131* 134* 133* 137  K 3.7  < > 3.7 3.7 3.6 3.7 4.1  CL 106  < > 105 104 106 106 111  CO2 18*  < > 20* 19* 21* 17* 20*  GLUCOSE 243*  < > 127* 201* 180* 294* 287*  BUN 43*  < > 39* 38* 33* 32* 26*  CREATININE 1.65*  < > 1.31* 1.35* 1.33* 1.35* 1.14*  CALCIUM 9.1  < > 8.9 8.9 9.2 9.3 9.2  MG  --   --  1.6*  --   --   --   --   PHOS 2.6  --   --   --   --   --   --   < > = values in this interval not displayed. Liver Function Tests: No results for input(s): AST, ALT, ALKPHOS, BILITOT, PROT, ALBUMIN in  the last 168 hours. No results for input(s): LIPASE, AMYLASE in the last 168 hours. No results for input(s): AMMONIA in the last 168 hours. CBC:  Recent Labs Lab 09/16/16 1155  WBC 7.1  HGB 12.2  HCT 40.1  MCV 94.1  PLT 157   Cardiac Enzymes:  Recent Labs Lab 09/16/16 1548 09/16/16 2118 09/17/16 0317 09/17/16 0859 09/17/16 1316  TROPONINI <0.03 0.10* 0.20* 0.16* 0.12*   BNP: Invalid input(s): POCBNP CBG:  Recent Labs Lab 09/17/16 1312 09/17/16 1624 09/17/16 2117 09/18/16 0820 09/18/16 1232  GLUCAP 184* 250* 355* 335* 228*   D-Dimer No results for input(s): DDIMER in the last 72 hours. Hgb A1c No results for input(s): HGBA1C in the last 72 hours. Lipid Profile No results for input(s): CHOL, HDL, LDLCALC, TRIG, CHOLHDL, LDLDIRECT in the last 72 hours. Thyroid function studies No results for input(s): TSH, T4TOTAL, T3FREE, THYROIDAB in the last 72 hours.  Invalid input(s): FREET3 Anemia work up No results for input(s): VITAMINB12, FOLATE, FERRITIN, TIBC, IRON, RETICCTPCT in the last 72 hours. Urinalysis    Component Value Date/Time   COLORURINE YELLOW 09/16/2016 1333   APPEARANCEUR CLEAR 09/16/2016 1333   LABSPEC 1.021 09/16/2016 1333   PHURINE 5.0 09/16/2016 1333   GLUCOSEU >=500 (A) 09/16/2016 1333   HGBUR SMALL (A) 09/16/2016 1333   BILIRUBINUR NEGATIVE 09/16/2016 1333   KETONESUR 80 (A) 09/16/2016 1333   PROTEINUR 100 (A) 09/16/2016 1333   UROBILINOGEN 0.2 03/02/2015 1153   NITRITE NEGATIVE 09/16/2016 1333   LEUKOCYTESUR NEGATIVE 09/16/2016 1333   Sepsis Labs Invalid input(s): PROCALCITONIN,  WBC,  LACTICIDVEN Microbiology Recent Results (from the past 240 hour(s))  MRSA PCR Screening     Status: Abnormal   Collection Time: 09/16/16  9:20 PM  Result Value Ref Range Status   MRSA by PCR POSITIVE (A) NEGATIVE Final    Comment:        The GeneXpert MRSA Assay (FDA approved for NASAL specimens only), is one component of a comprehensive MRSA  colonization surveillance program. It is not intended to diagnose MRSA infection nor to guide or monitor treatment for MRSA infections. RESULT CALLED TO, READ BACK BY AND VERIFIED WITH: Manuela Schwartz [id number] @ 2217 BY J SCOTTON      Time coordinating discharge: Over 30 minutes  SIGNED:   Jacquelin Hawking, MD  [facility] 09/18/2016, 4:39 PM Pager [phone]  If 7PM-7AM, please contact night-coverage [email or web address] Password [email or web address]

## 2016-10-05 ENCOUNTER — Emergency Department (HOSPITAL_COMMUNITY): Payer: Medicare Other

## 2016-10-05 ENCOUNTER — Encounter (HOSPITAL_COMMUNITY): Payer: Self-pay | Admitting: Emergency Medicine

## 2016-10-05 ENCOUNTER — Inpatient Hospital Stay (HOSPITAL_COMMUNITY)
Admission: EM | Admit: 2016-10-05 | Discharge: 2016-10-09 | DRG: 637 | Disposition: A | Discharge status: Home / Self Care | Payer: Medicare Other | Attending: Internal Medicine | Admitting: Internal Medicine

## 2016-10-05 DIAGNOSIS — N183 Chronic kidney disease, stage 3 unspecified: Secondary | ICD-10-CM | POA: Diagnosis present

## 2016-10-05 DIAGNOSIS — I959 Hypotension, unspecified: Secondary | ICD-10-CM | POA: Diagnosis present

## 2016-10-05 DIAGNOSIS — E872 Acidosis: Secondary | ICD-10-CM | POA: Diagnosis present

## 2016-10-05 DIAGNOSIS — E039 Hypothyroidism, unspecified: Secondary | ICD-10-CM | POA: Diagnosis present

## 2016-10-05 DIAGNOSIS — F329 Major depressive disorder, single episode, unspecified: Secondary | ICD-10-CM | POA: Diagnosis present

## 2016-10-05 DIAGNOSIS — I4510 Unspecified right bundle-branch block: Secondary | ICD-10-CM | POA: Diagnosis present

## 2016-10-05 DIAGNOSIS — E871 Hypo-osmolality and hyponatremia: Secondary | ICD-10-CM | POA: Diagnosis present

## 2016-10-05 DIAGNOSIS — N179 Acute kidney failure, unspecified: Secondary | ICD-10-CM | POA: Diagnosis present

## 2016-10-05 DIAGNOSIS — E860 Dehydration: Secondary | ICD-10-CM | POA: Diagnosis present

## 2016-10-05 DIAGNOSIS — Z888 Allergy status to other drugs, medicaments and biological substances status: Secondary | ICD-10-CM

## 2016-10-05 DIAGNOSIS — Z794 Long term (current) use of insulin: Secondary | ICD-10-CM | POA: Diagnosis not present

## 2016-10-05 DIAGNOSIS — Z87891 Personal history of nicotine dependence: Secondary | ICD-10-CM | POA: Diagnosis not present

## 2016-10-05 DIAGNOSIS — I4581 Long QT syndrome: Secondary | ICD-10-CM | POA: Diagnosis present

## 2016-10-05 DIAGNOSIS — Z7952 Long term (current) use of systemic steroids: Secondary | ICD-10-CM

## 2016-10-05 DIAGNOSIS — Z89412 Acquired absence of left great toe: Secondary | ICD-10-CM

## 2016-10-05 DIAGNOSIS — E11649 Type 2 diabetes mellitus with hypoglycemia without coma: Secondary | ICD-10-CM | POA: Diagnosis not present

## 2016-10-05 DIAGNOSIS — Y92009 Unspecified place in unspecified non-institutional (private) residence as the place of occurrence of the external cause: Secondary | ICD-10-CM | POA: Diagnosis not present

## 2016-10-05 DIAGNOSIS — E1122 Type 2 diabetes mellitus with diabetic chronic kidney disease: Secondary | ICD-10-CM | POA: Diagnosis present

## 2016-10-05 DIAGNOSIS — Z825 Family history of asthma and other chronic lower respiratory diseases: Secondary | ICD-10-CM | POA: Diagnosis not present

## 2016-10-05 DIAGNOSIS — I10 Essential (primary) hypertension: Secondary | ICD-10-CM | POA: Diagnosis present

## 2016-10-05 DIAGNOSIS — Z940 Kidney transplant status: Secondary | ICD-10-CM

## 2016-10-05 DIAGNOSIS — Z961 Presence of intraocular lens: Secondary | ICD-10-CM | POA: Diagnosis present

## 2016-10-05 DIAGNOSIS — Z9842 Cataract extraction status, left eye: Secondary | ICD-10-CM

## 2016-10-05 DIAGNOSIS — Z823 Family history of stroke: Secondary | ICD-10-CM | POA: Diagnosis not present

## 2016-10-05 DIAGNOSIS — Z22322 Carrier or suspected carrier of Methicillin resistant Staphylococcus aureus: Secondary | ICD-10-CM

## 2016-10-05 DIAGNOSIS — D61818 Other pancytopenia: Secondary | ICD-10-CM | POA: Diagnosis present

## 2016-10-05 DIAGNOSIS — E1310 Other specified diabetes mellitus with ketoacidosis without coma: Secondary | ICD-10-CM | POA: Diagnosis not present

## 2016-10-05 DIAGNOSIS — I1310 Hypertensive heart and chronic kidney disease without heart failure, with stage 1 through stage 4 chronic kidney disease, or unspecified chronic kidney disease: Secondary | ICD-10-CM | POA: Diagnosis present

## 2016-10-05 DIAGNOSIS — E875 Hyperkalemia: Secondary | ICD-10-CM | POA: Diagnosis present

## 2016-10-05 DIAGNOSIS — Z9114 Patient's other noncompliance with medication regimen: Secondary | ICD-10-CM

## 2016-10-05 DIAGNOSIS — E8729 Other acidosis: Secondary | ICD-10-CM | POA: Diagnosis present

## 2016-10-05 DIAGNOSIS — R0682 Tachypnea, not elsewhere classified: Secondary | ICD-10-CM | POA: Diagnosis present

## 2016-10-05 DIAGNOSIS — G9340 Encephalopathy, unspecified: Secondary | ICD-10-CM | POA: Diagnosis present

## 2016-10-05 DIAGNOSIS — Z9119 Patient's noncompliance with other medical treatment and regimen: Secondary | ICD-10-CM

## 2016-10-05 DIAGNOSIS — Z9841 Cataract extraction status, right eye: Secondary | ICD-10-CM

## 2016-10-05 DIAGNOSIS — E1110 Type 2 diabetes mellitus with ketoacidosis without coma: Secondary | ICD-10-CM | POA: Diagnosis present

## 2016-10-05 DIAGNOSIS — T383X6A Underdosing of insulin and oral hypoglycemic [antidiabetic] drugs, initial encounter: Secondary | ICD-10-CM | POA: Diagnosis present

## 2016-10-05 DIAGNOSIS — Z89421 Acquired absence of other right toe(s): Secondary | ICD-10-CM

## 2016-10-05 DIAGNOSIS — Z7982 Long term (current) use of aspirin: Secondary | ICD-10-CM

## 2016-10-05 DIAGNOSIS — Z79899 Other long term (current) drug therapy: Secondary | ICD-10-CM

## 2016-10-05 DIAGNOSIS — K219 Gastro-esophageal reflux disease without esophagitis: Secondary | ICD-10-CM | POA: Diagnosis present

## 2016-10-05 DIAGNOSIS — Z91128 Patient's intentional underdosing of medication regimen for other reason: Secondary | ICD-10-CM

## 2016-10-05 DIAGNOSIS — Z91048 Other nonmedicinal substance allergy status: Secondary | ICD-10-CM

## 2016-10-05 LAB — BASIC METABOLIC PANEL
ANION GAP: 19 — AB (ref 5–15)
ANION GAP: 10 (ref 5–15)
BUN: 33 mg/dL — ABNORMAL HIGH (ref 6–20)
BUN: 32 mg/dL — ABNORMAL HIGH (ref 6–20)
BUN: 31 mg/dL — ABNORMAL HIGH (ref 6–20)
CALCIUM: 9.8 mg/dL (ref 8.9–10.3)
CHLORIDE: 109 mmol/L (ref 101–111)
CO2: <7 mmol/L — ABNORMAL LOW (ref 22–32)
CO2: 13 mmol/L — ABNORMAL LOW (ref 22–32)
CO2: 17 mmol/L — AB (ref 22–32)
CREATININE: 1.91 mg/dL — AB (ref 0.44–1.00)
Calcium: 9.4 mg/dL (ref 8.9–10.3)
Calcium: 9.4 mg/dL (ref 8.9–10.3)
Chloride: 97 mmol/L — ABNORMAL LOW (ref 101–111)
Chloride: 106 mmol/L (ref 101–111)
Creatinine, Ser: 2.05 mg/dL — ABNORMAL HIGH (ref 0.44–1.00)
Creatinine, Ser: 1.69 mg/dL — ABNORMAL HIGH (ref 0.44–1.00)
GFR calc Af Amer: 32 mL/min — ABNORMAL LOW (ref >60)
GFR calc Af Amer: 40 mL/min — ABNORMAL LOW (ref >60)
GFR calc non Af Amer: 30 mL/min — ABNORMAL LOW (ref >60)
GFR calc non Af Amer: 27 mL/min — ABNORMAL LOW (ref >60)
GFR calc non Af Amer: 34 mL/min — ABNORMAL LOW (ref >60)
GFR, EST AFRICAN AMERICAN: 34 mL/min — AB (ref >60)
GLUCOSE: 235 mg/dL — AB (ref 65–99)
GLUCOSE: 112 mg/dL — AB (ref 65–99)
Glucose, Bld: 828 mg/dL (ref 65–99)
POTASSIUM: 4.6 mmol/L (ref 3.5–5.1)
POTASSIUM: 4.1 mmol/L (ref 3.5–5.1)
Potassium: 6.3 mmol/L (ref 3.5–5.1)
SODIUM: 131 mmol/L — AB (ref 135–145)
Sodium: 138 mmol/L (ref 135–145)
Sodium: 136 mmol/L (ref 135–145)

## 2016-10-05 LAB — BLOOD GAS, VENOUS
O2 SAT: 77.7 %
PO2 VEN: 53.6 mmHg — AB (ref 32.0–45.0)
Patient temperature: 98.6
pH, Ven: 7.017 — CL (ref 7.250–7.430)

## 2016-10-05 LAB — GLUCOSE, CAPILLARY
GLUCOSE-CAPILLARY: 400 mg/dL — AB (ref 65–99)
GLUCOSE-CAPILLARY: 231 mg/dL — AB (ref 65–99)
GLUCOSE-CAPILLARY: 199 mg/dL — AB (ref 65–99)
Glucose-Capillary: 118 mg/dL — ABNORMAL HIGH (ref 65–99)
Glucose-Capillary: 143 mg/dL — ABNORMAL HIGH (ref 65–99)
Glucose-Capillary: 155 mg/dL — ABNORMAL HIGH (ref 65–99)
Glucose-Capillary: 305 mg/dL — ABNORMAL HIGH (ref 65–99)
Glucose-Capillary: 461 mg/dL — ABNORMAL HIGH (ref 65–99)
Glucose-Capillary: 551 mg/dL (ref 65–99)
Glucose-Capillary: 99 mg/dL (ref 65–99)

## 2016-10-05 LAB — CBC
HCT: 38.2 % (ref 36.0–46.0)
HEMOGLOBIN: 12.4 g/dL (ref 12.0–15.0)
MCH: 29.1 pg (ref 26.0–34.0)
MCHC: 32.5 g/dL (ref 30.0–36.0)
MCV: 89.7 fL (ref 78.0–100.0)
PLATELETS: 195 10*3/uL (ref 150–400)
RBC: 4.26 MIL/uL (ref 3.87–5.11)
RDW: 14.2 % (ref 11.5–15.5)
WBC: 11.5 10*3/uL — ABNORMAL HIGH (ref 4.0–10.5)

## 2016-10-05 LAB — CBC WITH DIFFERENTIAL/PLATELET
Basophils Absolute: 0 10*3/uL (ref 0.0–0.1)
Basophils Relative: 0 %
EOS ABS: 0 10*3/uL (ref 0.0–0.7)
Eosinophils Relative: 0 %
HCT: 33.3 % — ABNORMAL LOW (ref 36.0–46.0)
Hemoglobin: 11 g/dL — ABNORMAL LOW (ref 12.0–15.0)
Lymphocytes Relative: 7 %
Lymphs Abs: 0.8 10*3/uL (ref 0.7–4.0)
MCH: 29.1 pg (ref 26.0–34.0)
MCHC: 33 g/dL (ref 30.0–36.0)
MCV: 88.1 fL (ref 78.0–100.0)
MONO ABS: 1 10*3/uL (ref 0.1–1.0)
Monocytes Relative: 9 %
NEUTROS PCT: 84 %
Neutro Abs: 9.4 10*3/uL — ABNORMAL HIGH (ref 1.7–7.7)
PLATELETS: 202 10*3/uL (ref 150–400)
RBC: 3.78 MIL/uL — AB (ref 3.87–5.11)
RDW: 14.1 % (ref 11.5–15.5)
WBC: 11.2 10*3/uL — AB (ref 4.0–10.5)

## 2016-10-05 LAB — URINALYSIS, ROUTINE W REFLEX MICROSCOPIC
Bilirubin Urine: NEGATIVE
Glucose, UA: >=500 mg/dL — AB
KETONES UR: 80 mg/dL — AB
LEUKOCYTES UA: NEGATIVE
Nitrite: NEGATIVE
PROTEIN: 100 mg/dL — AB
Specific Gravity, Urine: 1.016 (ref 1.005–1.030)
pH: 5 (ref 5.0–8.0)

## 2016-10-05 LAB — COMPREHENSIVE METABOLIC PANEL
ALBUMIN: 3.1 g/dL — AB (ref 3.5–5.0)
ALT: 8 U/L — ABNORMAL LOW (ref 14–54)
AST: 12 U/L — AB (ref 15–41)
Alkaline Phosphatase: 147 U/L — ABNORMAL HIGH (ref 38–126)
BUN: 31 mg/dL — AB (ref 6–20)
CHLORIDE: 93 mmol/L — AB (ref 101–111)
CO2: <7 mmol/L — ABNORMAL LOW (ref 22–32)
Calcium: 10 mg/dL (ref 8.9–10.3)
Creatinine, Ser: 2.06 mg/dL — ABNORMAL HIGH (ref 0.44–1.00)
GFR calc Af Amer: 31 mL/min — ABNORMAL LOW (ref >60)
GFR, EST NON AFRICAN AMERICAN: 27 mL/min — AB (ref >60)
Glucose, Bld: 922 mg/dL (ref 65–99)
POTASSIUM: 7.1 mmol/L — AB (ref 3.5–5.1)
SODIUM: 127 mmol/L — AB (ref 135–145)
Total Bilirubin: 2 mg/dL — ABNORMAL HIGH (ref 0.3–1.2)
Total Protein: 7.7 g/dL (ref 6.5–8.1)

## 2016-10-05 LAB — CBG MONITORING, ED

## 2016-10-05 LAB — LIPASE, BLOOD: LIPASE: 15 U/L (ref 11–51)

## 2016-10-05 LAB — MRSA PCR SCREENING: MRSA by PCR: POSITIVE — AB

## 2016-10-05 LAB — CREATININE, SERUM
Creatinine, Ser: 1.92 mg/dL — ABNORMAL HIGH (ref 0.44–1.00)
GFR calc Af Amer: 34 mL/min — ABNORMAL LOW (ref >60)
GFR, EST NON AFRICAN AMERICAN: 30 mL/min — AB (ref >60)

## 2016-10-05 MED ORDER — SODIUM CHLORIDE 0.9 % IV SOLN: INTRAVENOUS | Status: DC

## 2016-10-05 MED ORDER — MYCOPHENOLATE MOFETIL 250 MG PO CAPS
500.0000 mg | ORAL_CAPSULE | Freq: Two times a day (BID) | ORAL | Status: DC
  Administered 2016-10-05 – 2016-10-09 (×8): 500 mg via ORAL
  Filled 2016-10-05 (×8): qty 2

## 2016-10-05 MED ORDER — SODIUM CHLORIDE 0.9 % IV BOLUS (SEPSIS)
1000.0000 mL | Freq: Once | INTRAVENOUS | Status: AC
  Administered 2016-10-05: 1000 mL via INTRAVENOUS

## 2016-10-05 MED ORDER — CHLORHEXIDINE GLUCONATE CLOTH 2 % EX PADS
6.0000 | MEDICATED_PAD | Freq: Every day | CUTANEOUS | Status: DC
  Administered 2016-10-06 – 2016-10-09 (×4): 6 via TOPICAL

## 2016-10-05 MED ORDER — HEPARIN SODIUM (PORCINE) 5000 UNIT/ML IJ SOLN
5000.0000 [IU] | Freq: Three times a day (TID) | INTRAMUSCULAR | Status: DC
  Administered 2016-10-05 – 2016-10-09 (×11): 5000 [IU] via SUBCUTANEOUS
  Filled 2016-10-05 (×12): qty 1

## 2016-10-05 MED ORDER — DEXTROSE-NACL 5-0.45 % IV SOLN: INTRAVENOUS | Status: DC

## 2016-10-05 MED ORDER — TACROLIMUS 1 MG PO CAPS
1.0000 mg | ORAL_CAPSULE | Freq: Every day | ORAL | Status: DC
  Administered 2016-10-06 – 2016-10-09 (×4): 1 mg via ORAL
  Filled 2016-10-05 (×4): qty 1

## 2016-10-05 MED ORDER — LORATADINE 10 MG PO TABS
10.0000 mg | ORAL_TABLET | Freq: Every day | ORAL | Status: DC
Start: 2016-10-06 — End: 2016-10-09
  Administered 2016-10-06 – 2016-10-09 (×4): 10 mg via ORAL
  Filled 2016-10-05 (×4): qty 1

## 2016-10-05 MED ORDER — INSULIN ASPART 100 UNIT/ML ~~LOC~~ SOLN: 10.0000 [IU] | Freq: Once | SUBCUTANEOUS | Status: DC

## 2016-10-05 MED ORDER — PROCHLORPERAZINE EDISYLATE 5 MG/ML IJ SOLN
5.0000 mg | INTRAMUSCULAR | Status: DC | PRN
  Administered 2016-10-06: 5 mg via INTRAVENOUS
  Filled 2016-10-05: qty 2

## 2016-10-05 MED ORDER — INSULIN REGULAR HUMAN 100 UNIT/ML IJ SOLN
INTRAMUSCULAR | Status: DC
  Filled 2016-10-05: qty 2.5

## 2016-10-05 MED ORDER — DEXTROSE-NACL 5-0.45 % IV SOLN
INTRAVENOUS | Status: DC
  Administered 2016-10-05: 18:00:00 via INTRAVENOUS

## 2016-10-05 MED ORDER — DEXTROSE 50 % IV SOLN
25.0000 mL | INTRAVENOUS | Status: DC | PRN
  Filled 2016-10-05: qty 50

## 2016-10-05 MED ORDER — MUPIROCIN 2 % EX OINT
1.0000 "application " | TOPICAL_OINTMENT | Freq: Two times a day (BID) | CUTANEOUS | Status: DC
  Administered 2016-10-05 – 2016-10-09 (×7): 1 via NASAL
  Filled 2016-10-05 (×4): qty 22

## 2016-10-05 MED ORDER — TACROLIMUS 0.5 MG PO CAPS
0.5000 mg | ORAL_CAPSULE | Freq: Every evening | ORAL | Status: DC
  Administered 2016-10-06 – 2016-10-08 (×3): 0.5 mg via ORAL
  Filled 2016-10-05 (×4): qty 1

## 2016-10-05 MED ORDER — SODIUM CHLORIDE 0.9 % IV SOLN
1.0000 g | Freq: Once | INTRAVENOUS | Status: AC
Start: 2016-10-05 — End: 2016-10-05
  Administered 2016-10-05: 1 g via INTRAVENOUS
  Filled 2016-10-05: qty 10

## 2016-10-05 MED ORDER — PREDNISONE 5 MG PO TABS
5.0000 mg | ORAL_TABLET | Freq: Every day | ORAL | Status: DC
  Administered 2016-10-05 – 2016-10-08 (×4): 5 mg via ORAL
  Filled 2016-10-05 (×4): qty 1

## 2016-10-05 MED ORDER — SODIUM CHLORIDE 0.9% FLUSH
3.0000 mL | Freq: Two times a day (BID) | INTRAVENOUS | Status: DC
  Administered 2016-10-06 – 2016-10-09 (×5): 3 mL via INTRAVENOUS

## 2016-10-05 MED ORDER — ASPIRIN EC 81 MG PO TBEC
81.0000 mg | DELAYED_RELEASE_TABLET | Freq: Every day | ORAL | Status: DC
  Administered 2016-10-06 – 2016-10-09 (×4): 81 mg via ORAL
  Filled 2016-10-05 (×4): qty 1

## 2016-10-05 MED ORDER — SODIUM CHLORIDE 0.9 % IV SOLN
INTRAVENOUS | Status: DC
  Administered 2016-10-05: 5.4 [IU]/h via INTRAVENOUS
  Filled 2016-10-05: qty 2.5

## 2016-10-05 MED ORDER — INSULIN REGULAR BOLUS VIA INFUSION
0.0000 [IU] | Freq: Three times a day (TID) | INTRAVENOUS | Status: DC
  Filled 2016-10-05: qty 10

## 2016-10-05 MED ORDER — FLUTICASONE PROPIONATE 50 MCG/ACT NA SUSP
1.0000 | Freq: Every day | NASAL | Status: DC
  Filled 2016-10-05 (×2): qty 16

## 2016-10-05 MED ORDER — LEVOTHYROXINE SODIUM 25 MCG PO TABS
137.0000 ug | ORAL_TABLET | Freq: Every day | ORAL | Status: DC
  Administered 2016-10-05 – 2016-10-09 (×5): 137 ug via ORAL
  Filled 2016-10-05 (×6): qty 1

## 2016-10-05 MED ORDER — SODIUM CHLORIDE 0.9 % IV SOLN
INTRAVENOUS | Status: DC
  Administered 2016-10-05: 15:00:00 via INTRAVENOUS

## 2016-10-05 MED ORDER — ACETAMINOPHEN 325 MG PO TABS
650.0000 mg | ORAL_TABLET | Freq: Four times a day (QID) | ORAL | Status: DC | PRN
  Administered 2016-10-07 – 2016-10-08 (×2): 650 mg via ORAL
  Filled 2016-10-05 (×2): qty 2

## 2016-10-05 NOTE — H&P (Signed)
History and Physical   [name] Parodyamela S Ambers WUJ:[id number] DOB: 1966-08-28 DOA: 10/05/2016  Referring MD/NP/PA: Dr. Jacqulyn BathLong, EDP PCP: Dois DavenportICHTER,KAREN L., MD  Patient coming from: Home  Chief Complaint: hyperglycemia  HPI: [name] is a 51 y.o. female with a history of stage III CKD, s/p renal transplant, uncontrolled IDDM and recurrent DKA admissions who presented to the ED for evaluation of nausea, vomiting and hyperglycemia. She reports stopping insulin 2 days ago and began having multiple episodes of nonbloody emesis over the past 24 hours worsening, constant, severe, and associated with nausea and crampy generalized abdominal pain. She denies fever, chills, myalgias, cough, dyspnea, chest pain, changes in urination.   On arrival she was tachypneic and afebrile. Labs were grossly abnormal consistent with DKA, including glucose 922, bicarb < 7, and anion gap of at least 27. Creatinine 2.06, potassium 7.1, suspected to be hemolyzed with no significant ECG changes associated with hyperkalemia. VBG pH 7.017. CXR negative and UA with ketonuria and no WBCs. She received fluids and was started on insulin drip. TRH called for admission.   Review of Systems: Per HPI. All others reviewed and are negative.   Past Medical History:  Diagnosis Date  . Arthritis    "elbows, knees, legs, back" (09/24/2015)  . CKD (chronic kidney disease)   . Daily headache   . Depression    "years ago"  . End stage renal disease (HCC)    right arm AV graft, post transplant  . Hypertension   . Hypothyroid   . 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 Kidney disease   . Pneumonia ~ 2007?  [name]. Type II diabetes mellitus (HCC)    Insulin dependant    Past Surgical History:  Procedure Laterality Date  . AMPUTATION Left 05/11/2014   Procedure: AMPUTATION LEFT GREAT TOE;  Surgeon: Kathryne Hitchhristopher Y Blackman, MD;  Location: [facility];  Service: Orthopedics;  Laterality: Left;  . AV FISTULA PLACEMENT Right    forearm  . BACK SURGERY    . CATARACT EXTRACTION W/ INTRAOCULAR LENS  IMPLANT, BILATERAL Bilateral   . CESAREAN SECTION  07/1999  . DG AV DIALYSIS GRAFT DECLOT OR    . INCISION AND DRAINAGE Right 06/02/2015   Procedure: INCISION AND DRAINAGE OF RIGHT 3rd RAY RESECTION;  Surgeon: Kathryne Hitchhristopher Y Blackman, MD;  Location: [facility];  Service: Orthopedics;  Laterality: Right;  . 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 LUMBAR DISC SURGERY  2001  . NEPHRECTOMY TRANSPLANTED ORGAN    . TOE AMPUTATION Right    1,2 & 3rd toes.  . TUBAL LIGATION  07/1999    - Denies currently smoking, drinking alcohol or using illicit substances.   Allergies  Allergen Reactions  . Ace Inhibitors Cough  . Adhesive [Tape] Itching and Other (See Comments)    Please use paper tape  . Lisinopril Cough    Family History  Problem Relation Age of Onset  . Emphysema Mother   . Throat cancer Mother   . COPD Mother   . Cancer Mother   . Emphysema Father   . COPD Father   . Stroke Father   . ADD / ADHD Son    - Family history otherwise reviewed and not pertinent.  Prior to Admission medications   Medication Sig Start Date End Date Taking? Authorizing Provider  acetaminophen (TYLENOL) 325 MG tablet Take 2 tablets (650 mg total) by mouth every 6 (six) hours as needed for mild pain, moderate pain, fever or  headache. 05/15/16   Elease Etienne, MD  amLODipine (NORVASC) 10 MG tablet Take 1 tablet (10 mg total) by mouth daily. 08/14/16   Belkys A Regalado, MD  aspirin EC 81 MG tablet Take 81 mg by mouth daily.    Historical Provider, MD  carvedilol (COREG) 6.25 MG tablet Take 1 tablet (6.25 mg total) by mouth 2 (two) times daily. Patient taking differently: Take 25 mg by mouth 2 (two) times daily.  06/30/16   Rodolph Bong, MD  fluticasone (FLONASE) 50 MCG/ACT nasal spray Place 1 spray into both nostrils daily. 08/14/16   Belkys A Regalado, MD  insulin NPH-regular Human (NOVOLIN 70/30) (70-30) 100 UNIT/ML injection  Inject 40 Units into the skin 2 (two) times daily. 09/18/16   Narda Bonds, MD  levothyroxine (SYNTHROID, LEVOTHROID) 137 MCG tablet Take 1 tablet (137 mcg total) by mouth daily. BRAND NAME ONLY 03/04/15   Alison Murray, MD  loratadine (CLARITIN) 10 MG tablet Take 1 tablet (10 mg total) by mouth daily. 08/14/16   Belkys A Regalado, MD  mycophenolate (CELLCEPT) 250 MG capsule Take 500 mg by mouth 2 (two) times daily.     Historical Provider, MD  predniSONE (DELTASONE) 5 MG tablet Take 5 mg by mouth at bedtime.  05/29/15   Historical Provider, MD  ranitidine (ZANTAC) 150 MG tablet Take 150 mg by mouth at bedtime.  03/27/16   Historical Provider, MD  SSD 1 % cream Apply 1 application topically daily.  06/10/16   Historical Provider, MD  tacrolimus (PROGRAF) 0.5 MG capsule Take 0.5-1 mg by mouth See admin instructions. Take 1 mg by mouth in the morning and take 0.5 mg by mouth at night.    Historical Provider, MD    Physical Exam: Vitals:   10/05/16 1155 10/05/16 1301 10/05/16 1400 10/05/16 1500  BP: 108/55 (!) 78/42 100/74 (!) 107/48  Pulse: 90 90 90 89  Resp:  22 (!) 27 (!) 29  Temp:      TempSrc:      SpO2: 99% 99% (!) 73% 100%  Weight:      Height:       Constitutional: 51 y.o. female in mild distress, laying in fetal position. Eyes: Lids and conjunctivae normal, PERRL ENMT: Mucous membranes are dry. Posterior pharynx clear of any exudate or lesions. Poor dentition.  Neck: normal, supple, no masses, no thyromegaly Respiratory: Non-labored, rapid breathing without accessory muscle use. Clear breath sounds to auscultation bilaterally Cardiovascular: Regular rate and rhythm, no murmurs, rubs, or gallops. No carotid bruits. No JVD. No LE edema. 2+ pedal pulses. Abdomen: Normoactive bowel sounds. No tenderness, non-distended, and no masses palpated. No hepatosplenomegaly. GU: No indwelling catheter Musculoskeletal: No clubbing / cyanosis. No joint deformity upper and lower extremities. Good ROM,  no contractures. Normal muscle tone.  Skin: Warm, dry. No rashes, wounds, no ulcers. Diffuse tattoos present. Neurologic: CN II-XII grossly intact. Gait not assessed. Speech mumbled without aphasia/dysphasia with encouragement for effort. No focal deficits in motor strength or sensation in all extremities.  Psychiatric: Alert and oriented x3. Impaired recent memory: cannot recall why she stopped taking insulin.   Labs on Admission: I have personally reviewed following labs and imaging studies  CBC:  Recent Labs Lab 10/05/16 1152  WBC 11.2*  NEUTROABS 9.4*  HGB 11.0*  HCT 33.3*  MCV 88.1  PLT 202   Basic Metabolic Panel:  Recent Labs Lab 10/05/16 0919 10/05/16 1152  NA 127* 131*  K 7.1* 6.3*  CL 93*  97*  CO2 <7* <7*  GLUCOSE 922* 828*  BUN 31* 33*  CREATININE 2.06* 1.91*  CALCIUM 10.0 9.8   GFR: Estimated Creatinine Clearance: 41.3 mL/min (by C-G formula based on SCr of 1.91 mg/dL (H)). Liver Function Tests:  Recent Labs Lab 10/05/16 0919  AST 12*  ALT 8*  ALKPHOS 147*  BILITOT 2.0*  PROT 7.7  ALBUMIN 3.1*    Recent Labs Lab 10/05/16 0919  LIPASE 15   CBG:  Recent Labs Lab 10/05/16 0909 10/05/16 1152 10/05/16 1242 10/05/16 1355 10/05/16 1502  GLUCAP >600* >600* >600* 551* 461*   Urine analysis:    Component Value Date/Time   COLORURINE STRAW (A) 10/05/2016 1109   APPEARANCEUR CLEAR 10/05/2016 1109   LABSPEC 1.016 10/05/2016 1109   PHURINE 5.0 10/05/2016 1109   GLUCOSEU >=500 (A) 10/05/2016 1109   HGBUR SMALL (A) 10/05/2016 1109   BILIRUBINUR NEGATIVE 10/05/2016 1109   KETONESUR 80 (A) 10/05/2016 1109   PROTEINUR 100 (A) 10/05/2016 1109   UROBILINOGEN 0.2 03/02/2015 1153   NITRITE NEGATIVE 10/05/2016 1109   LEUKOCYTESUR NEGATIVE 10/05/2016 1109   Radiological Exams on Admission: Dg Chest Portable 1 View  Result Date: 10/05/2016 CLINICAL DATA:  Shortness of breath today. EXAM: PORTABLE CHEST 1 VIEW COMPARISON:  PA and lateral chest  08/12/2016 and single view of the chest 04/15/2016. FINDINGS: The lungs are clear. Heart size is normal. No pneumothorax or pleural effusion. No acute bony abnormality. IMPRESSION: Negative chest. Electronically Signed   By: Drusilla Kanner M.D.   On: 10/05/2016 10:07    EKG: Independently reviewed. Tachycardia with RBBB stable from prior. peaked T waves noted.  Assessment/Plan Principal Problem:   DKA (diabetic ketoacidoses) (HCC) Active Problems:   Hyperkalemia   H/O kidney transplant   High anion gap metabolic acidosis   Essential hypertension   Acute renal failure superimposed on stage 3 chronic kidney disease (HCC)   Acute encephalopathy   DKA in IDDM: Due to nonadherence, a recurrent struggle for her (6th admission in as many months). No evidence of precipitating infection. HbA1c was 12% in Dec 2017.  - Aggressive rehydration - Insulin gtt per protocol, transition to sq when anion gap is normal, bicarb is >18   - Close observation of electrolytes, replace as indicated.  - Consider stress steroids if hypotension doesn't resolve with fluids.  Hyperkalemia: Partially hemolyzed initially, recheck at 6.3. ECG with peaked T waves, otherwise unchanged RBBB from prior.  - Anticipate this will decrease with treatment of DKA. - Recheck BMP q4h as above - Will give CaGluconate given ECG changes and repeat ECG  AKI on stage III CKD: Baseline Cr ~1.5 and is 2.06 on admission from dehydration due to osmotic diuresis.  - Continue fluid resuscitation and minimize the use of nephrotoxic agents  Hypothyroidism: - Continue synthroid  Hyponatremia: Appears to be pseudohyponatremia secondary to hyperglycemia - Monitor as above  H/O kidney transplant: - Continue prednisone, cellcept and Prograf  Essential hypertension: Chronic. Currently hypotensive - Hold home medications  GERD: Chronic, stable - Hold ranitidine for now  DVT prophylaxis: Heparin  Code Status: Full  Family  Communication: None at bedside Disposition Plan: Home when medically stable Consults called: None  Admission status: Inpatient, SDU    Hazeline Junker, MD [facility] Pager [phone]  If 7PM-7AM, please contact night-coverage [email or web address] Password [facility] 10/05/2016, 3:18 PM

## 2016-10-05 NOTE — Progress Notes (Signed)
Inpatient Diabetes Program Recommendations  AACE/ADA: New Consensus Statement on Inpatient Glycemic Control (2015)  Target Ranges:  Prepandial:   less than 140 mg/dL      Peak postprandial:   less than 180 mg/dL (1-2 hours)      Critically ill patients:  140 - 180 mg/dL   Lab Results  Component Value Date   ANVBTY 606 (HH) 10/05/2016   HGBA1C 12.5 (H) 08/12/2016    Review of Glycemic Control  Diabetes history: DM1 Outpatient Diabetes medications: Novolin 70/30 40 units bid Current orders for Inpatient glycemic control: IV insulin per GlucoStabilizer  7th admission for DKA. Pt reports stopping insulin 2 days ago?  Inpatient Diabetes Program Recommendations:    Continue with IV insulin until criteria met for discontinuation of drip. Will speak with pt on 1/30.  Thank you. Lorenda Peck, RD, LDN, CDE Inpatient Diabetes Coordinator [phone]

## 2016-10-05 NOTE — ED Provider Notes (Signed)
Emergency Department Provider Note   I have reviewed the triage vital signs and the nursing notes.   HISTORY  Chief Complaint Hyperglycemia   HPI [name] is a 51 y.o. female with PMH of IDDM with prior DKA, CKD s/p renal transplant, Depression, HTN, and GERD presents to the emergency department by EMS for evaluation of nausea, vomiting, rapid breathing, and abdominal discomfort. Patient states that she stopped using her insulin 2 days ago for unknown reason. She denies any recent infection symptoms such as fever, chills, body aches. No chest pain. She has had multiple episodes of vomiting but no diarrhea. She denies any drug or alcohol abuse. No exacerbating or alleviating factors.   Past Medical History:  Diagnosis Date  . Arthritis    "elbows, knees, legs, back" (09/24/2015)  . CKD (chronic kidney disease)   . Daily headache   . Depression    "years ago"  . End stage renal disease (HCC)    right arm AV graft, post transplant  . Hypertension   . Hypothyroid   . 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 Kidney disease   . Pneumonia ~ 2007?  [name]. Type II diabetes mellitus (HCC)    Insulin dependant    Patient Active Problem List   Diagnosis Date Noted  . DKA (diabetic ketoacidoses) (HCC) 08/12/2016  . Diabetic acidosis without coma (HCC)   . DKA, type 2 (HCC) 07/21/2016  . Hypoglycemia   . Wound cellulitis   . CKD (chronic kidney disease) stage 3, GFR 30-59 ml/min 06/26/2016  . Cellulitis 06/26/2016  . Cellulitis of right lower extremity   . Hyperglycemia   . QT prolongation 05/13/2016  . UTI (lower urinary tract infection)   . Hypomagnesemia   . Pressure ulcer 04/11/2016  . Acute encephalopathy 04/11/2016  . Altered mental state 04/11/2016  . Altered mental status 04/10/2016  . Insulin dependent diabetes mellitus (HCC) 04/10/2016  . Diabetic ketoacidosis without coma associated with type 2 diabetes mellitus (HCC) 11/02/2015  .  Hyperglycemia without ketosis 09/24/2015  . CKD (chronic kidney disease) stage 2, GFR 60-89 ml/min 09/24/2015  . History of renal transplant 09/24/2015  . Uncontrolled type 1 diabetes mellitus with foot ulcer (HCC) 06/04/2015  . History of renal transplantation 06/04/2015  . Foot ulcer, right (HCC)   . Foot abscess, right   . Diarrhea 06/02/2015  . DM (diabetes mellitus) type 2, uncontrolled, with ketoacidosis (HCC) 06/02/2015  . Type 1 diabetes mellitus with diabetic foot ulcer (HCC) 06/02/2015  . Acute renal failure superimposed on stage 3 chronic kidney disease (HCC) 06/02/2015  . Diabetic foot infection (HCC) 06/01/2015  . Essential hypertension 06/01/2015  . GERD (gastroesophageal reflux disease) 06/01/2015  . Hypotension 03/22/2014  . End stage renal disease (HCC) 10/24/2012  . H/O kidney transplant 08/04/2012  . High anion gap metabolic acidosis 08/04/2012  . Sepsis (HCC) 08/04/2012  . DKA, type 1 (HCC) 09/22/2011  . Hyperkalemia 09/22/2011  . Hyponatremia 09/22/2011  . Immunosuppression (HCC)   . Hypothyroidism     Past Surgical History:  Procedure Laterality Date  . AMPUTATION Left 05/11/2014   Procedure: AMPUTATION LEFT GREAT TOE;  Surgeon: Kathryne Hitchhristopher Y Blackman, MD;  Location: [facility];  Service: Orthopedics;  Laterality: Left;  . AV FISTULA PLACEMENT Right    forearm  . BACK SURGERY    . CATARACT EXTRACTION W/ INTRAOCULAR LENS  IMPLANT, BILATERAL Bilateral   . CESAREAN SECTION  07/1999  . DG AV DIALYSIS GRAFT DECLOT  OR    . INCISION AND DRAINAGE Right 06/02/2015   Procedure: INCISION AND DRAINAGE OF RIGHT 3rd RAY RESECTION;  Surgeon: Kathryne Hitch, MD;  Location: [facility];  Service: Orthopedics;  Laterality: Right;  . 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 LUMBAR DISC SURGERY  2001  . NEPHRECTOMY TRANSPLANTED ORGAN    . TOE AMPUTATION Right    1,2 & 3rd toes.  . TUBAL LIGATION  07/1999      Allergies Ace inhibitors; Adhesive [tape]; and  Lisinopril  Family History  Problem Relation Age of Onset  . Emphysema Mother   . Throat cancer Mother   . COPD Mother   . Cancer Mother   . Emphysema Father   . COPD Father   . Stroke Father   . ADD / ADHD Son     Social History Social History  Substance Use Topics  . Smoking status: Former Smoker    Packs/day: 1.00    Years: 11.00    Types: Cigarettes    Quit date: 09/21/1998  . Smokeless tobacco: Never Used  . Alcohol use No    Review of Systems   10-point ROS otherwise negative.  ____________________________________________   PHYSICAL EXAM:  VITAL SIGNS: ED Triage Vitals  Enc Vitals Group     BP 10/05/16 0912 156/74     Pulse Rate 10/05/16 0912 97     Resp 10/05/16 0912 (!) 30     SpO2 10/05/16 0912 100 %     Weight 10/05/16 0913 194 lb (88 kg)     Height 10/05/16 0913 5\' 8"  (1.727 m)     Pain Score 10/05/16 0913 0   Constitutional: Rapid heavy breathing. Appears to be in some moderate distress but able to provide history.  Eyes: Conjunctivae are normal. PERRL. Head: Atraumatic. Nose: No congestion/rhinnorhea. Mouth/Throat: Mucous membranes are very dry.   Neck: No stridor.   Cardiovascular: Normal rate, regular rhythm. Good peripheral circulation. Grossly normal heart sounds.   Respiratory: Kussmaul respirations.  No retractions. Lungs CTAB. Gastrointestinal: Soft and nontender. No distention.  Musculoskeletal: No lower extremity tenderness nor edema. No gross deformities of extremities. Neurologic:  Normal speech and language. No gross focal neurologic deficits are appreciated.  Skin:  Skin is warm, dry and intact. No rash noted.  ____________________________________________   LABS (all labs ordered are listed, but only abnormal results are displayed)  Labs Reviewed  MRSA PCR SCREENING - Abnormal; Notable for the following:       Result Value   MRSA by PCR POSITIVE (*)    All other components within normal limits  COMPREHENSIVE METABOLIC  PANEL - Abnormal; Notable for the following:    Sodium 127 (*)    Potassium 7.1 (*)    Chloride 93 (*)    CO2 <7 (*)    Glucose, Bld 922 (*)    BUN 31 (*)    Creatinine, Ser 2.06 (*)    Albumin 3.1 (*)    AST 12 (*)    ALT 8 (*)    Alkaline Phosphatase 147 (*)    Total Bilirubin 2.0 (*)    GFR calc non Af Amer 27 (*)    GFR calc Af Amer 31 (*)    All other components within normal limits  BLOOD GAS, VENOUS - Abnormal; Notable for the following:    pH, Ven 7.017 (*)    pO2, Ven 53.6 (*)    All other components within normal limits  URINALYSIS, ROUTINE W REFLEX MICROSCOPIC -  Abnormal; Notable for the following:    Color, Urine STRAW (*)    Glucose, UA >=500 (*)    Hgb urine dipstick SMALL (*)    Ketones, ur 80 (*)    Protein, ur 100 (*)    Bacteria, UA RARE (*)    Squamous Epithelial / LPF 0-5 (*)    All other components within normal limits  BASIC METABOLIC PANEL - Abnormal; Notable for the following:    Sodium 131 (*)    Potassium 6.3 (*)    Chloride 97 (*)    CO2 <7 (*)    Glucose, Bld 828 (*)    BUN 33 (*)    Creatinine, Ser 1.91 (*)    GFR calc non Af Amer 30 (*)    GFR calc Af Amer 34 (*)    All other components within normal limits  CBC WITH DIFFERENTIAL/PLATELET - Abnormal; Notable for the following:    WBC 11.2 (*)    RBC 3.78 (*)    Hemoglobin 11.0 (*)    HCT 33.3 (*)    Neutro Abs 9.4 (*)    All other components within normal limits  GLUCOSE, CAPILLARY - Abnormal; Notable for the following:    Glucose-Capillary 551 (*)    All other components within normal limits  BASIC METABOLIC PANEL - Abnormal; Notable for the following:    CO2 13 (*)    Glucose, Bld 235 (*)    BUN 32 (*)    Creatinine, Ser 2.05 (*)    GFR calc non Af Amer 27 (*)    GFR calc Af Amer 32 (*)    Anion gap 19 (*)    All other components within normal limits  BASIC METABOLIC PANEL - Abnormal; Notable for the following:    CO2 17 (*)    Glucose, Bld 112 (*)    BUN 31 (*)     Creatinine, Ser 1.69 (*)    GFR calc non Af Amer 34 (*)    GFR calc Af Amer 40 (*)    All other components within normal limits  BASIC METABOLIC PANEL - Abnormal; Notable for the following:    CO2 16 (*)    Glucose, Bld 149 (*)    BUN 28 (*)    Creatinine, Ser 1.54 (*)    GFR calc non Af Amer 39 (*)    GFR calc Af Amer 45 (*)    All other components within normal limits  GLUCOSE, CAPILLARY - Abnormal; Notable for the following:    Glucose-Capillary 461 (*)    All other components within normal limits  CBC - Abnormal; Notable for the following:    WBC 11.5 (*)    All other components within normal limits  CREATININE, SERUM - Abnormal; Notable for the following:    Creatinine, Ser 1.92 (*)    GFR calc non Af Amer 30 (*)    GFR calc Af Amer 34 (*)    All other components within normal limits  GLUCOSE, CAPILLARY - Abnormal; Notable for the following:    Glucose-Capillary 400 (*)    All other components within normal limits  CBC - Abnormal; Notable for the following:    Hemoglobin 11.5 (*)    HCT 34.8 (*)    All other components within normal limits  GLUCOSE, CAPILLARY - Abnormal; Notable for the following:    Glucose-Capillary 305 (*)    All other components within normal limits  GLUCOSE, CAPILLARY - Abnormal; Notable for the following:  Glucose-Capillary 231 (*)    All other components within normal limits  BASIC METABOLIC PANEL - Abnormal; Notable for the following:    CO2 18 (*)    Glucose, Bld 196 (*)    BUN 29 (*)    Creatinine, Ser 1.51 (*)    GFR calc non Af Amer 40 (*)    GFR calc Af Amer 46 (*)    All other components within normal limits  GLUCOSE, CAPILLARY - Abnormal; Notable for the following:    Glucose-Capillary 199 (*)    All other components within normal limits  GLUCOSE, CAPILLARY - Abnormal; Notable for the following:    Glucose-Capillary 155 (*)    All other components within normal limits  GLUCOSE, CAPILLARY - Abnormal; Notable for the following:     Glucose-Capillary 143 (*)    All other components within normal limits  GLUCOSE, CAPILLARY - Abnormal; Notable for the following:    Glucose-Capillary 118 (*)    All other components within normal limits  GLUCOSE, CAPILLARY - Abnormal; Notable for the following:    Glucose-Capillary 119 (*)    All other components within normal limits  GLUCOSE, CAPILLARY - Abnormal; Notable for the following:    Glucose-Capillary 116 (*)    All other components within normal limits  GLUCOSE, CAPILLARY - Abnormal; Notable for the following:    Glucose-Capillary 156 (*)    All other components within normal limits  GLUCOSE, CAPILLARY - Abnormal; Notable for the following:    Glucose-Capillary 174 (*)    All other components within normal limits  GLUCOSE, CAPILLARY - Abnormal; Notable for the following:    Glucose-Capillary 188 (*)    All other components within normal limits  GLUCOSE, CAPILLARY - Abnormal; Notable for the following:    Glucose-Capillary 197 (*)    All other components within normal limits  GLUCOSE, CAPILLARY - Abnormal; Notable for the following:    Glucose-Capillary 174 (*)    All other components within normal limits  GLUCOSE, CAPILLARY - Abnormal; Notable for the following:    Glucose-Capillary 152 (*)    All other components within normal limits  GLUCOSE, CAPILLARY - Abnormal; Notable for the following:    Glucose-Capillary 154 (*)    All other components within normal limits  CBG MONITORING, ED - Abnormal; Notable for the following:    Glucose-Capillary >600 (*)    All other components within normal limits  CBG MONITORING, ED - Abnormal; Notable for the following:    Glucose-Capillary >600 (*)    All other components within normal limits  CBG MONITORING, ED - Abnormal; Notable for the following:    Glucose-Capillary >600 (*)    All other components within normal limits  LIPASE, BLOOD  GLUCOSE, CAPILLARY  CBC WITH DIFFERENTIAL/PLATELET  CBC WITH DIFFERENTIAL/PLATELET    ____________________________________________  EKG   EKG Interpretation  Date/Time:  Monday October 05 2016 09:34:23 EST Ventricular Rate:  96 PR Interval:    QRS Duration: 157 QT Interval:  438 QTC Calculation: 554 R Axis:   134 Text Interpretation:  Sinus rhythm RBBB and LPFB Baseline wander in lead(s) V4 V5 V6 Similar to prior. No STEMI.  Confirmed by Victory Dresden MD, Jerusalem Brownstein [phone]) on 10/05/2016 9:40:23 AM       ____________________________________________  RADIOLOGY  Dg Chest Portable 1 View  Result Date: 10/05/2016 CLINICAL DATA:  Shortness of breath today. EXAM: PORTABLE CHEST 1 VIEW COMPARISON:  PA and lateral chest 08/12/2016 and single view of the chest 04/15/2016. FINDINGS: The lungs  are clear. Heart size is normal. No pneumothorax or pleural effusion. No acute bony abnormality. IMPRESSION: Negative chest. Electronically Signed   By: Drusilla Kanner M.D.   On: 10/05/2016 10:07    ____________________________________________   PROCEDURES  Procedure(s) performed:   Procedures  CRITICAL CARE Performed by: Maia Plan Total critical care time: 50 minutes Critical care time was exclusive of separately billable procedures and treating other patients. Critical care was necessary to treat or prevent imminent or life-threatening deterioration. Critical care was time spent personally by me on the following activities: development of treatment plan with patient and/or surrogate as well as nursing, discussions with consultants, evaluation of patient's response to treatment, examination of patient, obtaining history from patient or surrogate, ordering and performing treatments and interventions, ordering and review of laboratory studies, ordering and review of radiographic studies, pulse oximetry and re-evaluation of patient's condition.  Alona Bene, MD Emergency Medicine  ____________________________________________   INITIAL IMPRESSION / ASSESSMENT AND PLAN / ED  COURSE  Pertinent labs & imaging results that were available during my care of the patient were reviewed by me and considered in my medical decision making (see chart for details).  Patient with past nuchal history of insulin-dependent diabetes, renal transplant, or episodes of DKA presents with likely DKA. She has very dry mucous membranes, rapid deep breathing, appears to be uncomfortable. She stopped insulin 2 days ago for unknown reason. No obvious infection etiology.  10:43 AM Called by lab regarding medical high glucose of greater than 900. Patient also with slightly hemolyzed potassium of greater than 7. No QRS widening on EKG when compared to prior tracing. Plan for insulin bolus and Infusion with continued IV fluids. Plan to repeat the potassium with some slight hemolysis noted by lab.    Patient with some hypotension developing. Added additional 2L IVF. Patient admitted by Dr. Jarvis Newcomer. Insulin drip running with repeat labs and potassium pending. Patient orders placed for the Stepdown ICU.  ____________________________________________  FINAL CLINICAL IMPRESSION(S) / ED DIAGNOSES  Final diagnoses:  Diabetic ketoacidosis without coma associated with type 2 diabetes mellitus (HCC)     MEDICATIONS GIVEN DURING THIS VISIT:  Medications  fluticasone (FLONASE) 50 MCG/ACT nasal spray 1 spray (not administered)  loratadine (CLARITIN) tablet 10 mg (10 mg Oral Given 10/06/16 0924)  tacrolimus (PROGRAF) capsule 1 mg (1 mg Oral Given 10/06/16 0924)  tacrolimus (PROGRAF) capsule 0.5 mg (not administered)  acetaminophen (TYLENOL) tablet 650 mg (not administered)  aspirin EC tablet 81 mg (81 mg Oral Given 10/06/16 0925)  predniSONE (DELTASONE) tablet 5 mg (5 mg Oral Given 10/05/16 2238)  levothyroxine (SYNTHROID, LEVOTHROID) tablet 137 mcg (137 mcg Oral Given 10/06/16 0924)  mycophenolate (CELLCEPT) capsule 500 mg (500 mg Oral Given 10/06/16 0925)  insulin regular bolus via infusion 0-10 Units (0  Units Intravenous Not Given 10/06/16 0800)  insulin regular (NOVOLIN R,HUMULIN R) 250 Units in sodium chloride 0.9 % 250 mL (1 Units/mL) infusion (3.5 Units/hr Intravenous Rate/Dose Change 10/06/16 0810)  dextrose 50 % solution 25 mL (not administered)  heparin injection 5,000 Units (5,000 Units Subcutaneous Given 10/06/16 0557)  sodium chloride flush (NS) 0.9 % injection 3 mL (3 mLs Intravenous Not Given 10/06/16 1000)  mupirocin ointment (BACTROBAN) 2 % 1 application (1 application Nasal Given 10/06/16 0923)  Chlorhexidine Gluconate Cloth 2 % PADS 6 each (6 each Topical Given 10/06/16 0925)  prochlorperazine (COMPAZINE) injection 5 mg (5 mg Intravenous Given 10/06/16 0222)  dextrose 10 % infusion ( Intravenous Rate/Dose Verify 10/06/16 0800)  amLODipine (NORVASC) tablet 10 mg (10 mg Oral Given 10/06/16 0924)  carvedilol (COREG) tablet 6.25 mg (6.25 mg Oral Given 10/06/16 0923)  sodium chloride 0.9 % bolus 1,000 mL (0 mLs Intravenous Stopped 10/05/16 1232)  sodium chloride 0.9 % bolus 1,000 mL (1,000 mLs Intravenous Given by Other 10/05/16 1230)  sodium chloride 0.9 % bolus 1,000 mL (1,000 mLs Intravenous Given 10/05/16 1415)  calcium gluconate 1 g in sodium chloride 0.9 % 100 mL IVPB (1 g Intravenous Given 10/05/16 1631)  sodium chloride 0.9 % bolus 1,000 mL (1,000 mLs Intravenous Given 10/06/16 0140)  sodium chloride 0.9 % bolus 1,000 mL (1,000 mLs Intravenous Given 10/06/16 0634)     NEW OUTPATIENT MEDICATIONS STARTED DURING THIS VISIT:  None   Note:  This document was prepared using Dragon voice recognition software and may include unintentional dictation errors.  Alona Bene, MD Emergency Medicine  Maia Plan, MD 10/06/16 [phone]

## 2016-10-05 NOTE — ED Notes (Signed)
Main lab notified about lab draw and re-collect due to hemolyzed.

## 2016-10-05 NOTE — ED Notes (Signed)
Attempted IV access x2, both unsuccessful. 

## 2016-10-05 NOTE — ED Triage Notes (Signed)
Per EMS patient from home for high blood sugar due to not having insulin for past 2 days.  Patient had kidney transplant and currently on transplant list for pancreas.  Patient vitals 119/76, 100, rr 30.  cbg read high.   Patient has some confusion  family members reported to EMS.

## 2016-10-05 NOTE — ED Notes (Signed)
Bed: [id number] Expected date:  Expected time:  Means of arrival:  Comments: EMS- hyperglycemia/DKA?

## 2016-10-06 DIAGNOSIS — N183 Chronic kidney disease, stage 3 (moderate): Secondary | ICD-10-CM

## 2016-10-06 DIAGNOSIS — Z940 Kidney transplant status: Secondary | ICD-10-CM

## 2016-10-06 DIAGNOSIS — E1310 Other specified diabetes mellitus with ketoacidosis without coma: Secondary | ICD-10-CM

## 2016-10-06 DIAGNOSIS — E875 Hyperkalemia: Secondary | ICD-10-CM

## 2016-10-06 DIAGNOSIS — N179 Acute kidney failure, unspecified: Secondary | ICD-10-CM

## 2016-10-06 DIAGNOSIS — I10 Essential (primary) hypertension: Secondary | ICD-10-CM

## 2016-10-06 LAB — BASIC METABOLIC PANEL
ANION GAP: 11 (ref 5–15)
ANION GAP: 8 (ref 5–15)
ANION GAP: 3 — AB (ref 5–15)
BUN: 28 mg/dL — AB (ref 6–20)
BUN: 29 mg/dL — AB (ref 6–20)
BUN: 26 mg/dL — ABNORMAL HIGH (ref 6–20)
CALCIUM: 9.2 mg/dL (ref 8.9–10.3)
CALCIUM: 9 mg/dL (ref 8.9–10.3)
CO2: 16 mmol/L — ABNORMAL LOW (ref 22–32)
CO2: 18 mmol/L — AB (ref 22–32)
CO2: 20 mmol/L — ABNORMAL LOW (ref 22–32)
Calcium: 9 mg/dL (ref 8.9–10.3)
Chloride: 109 mmol/L (ref 101–111)
Chloride: 110 mmol/L (ref 101–111)
Chloride: 111 mmol/L (ref 101–111)
Creatinine, Ser: 1.54 mg/dL — ABNORMAL HIGH (ref 0.44–1.00)
Creatinine, Ser: 1.51 mg/dL — ABNORMAL HIGH (ref 0.44–1.00)
Creatinine, Ser: 1.38 mg/dL — ABNORMAL HIGH (ref 0.44–1.00)
GFR calc Af Amer: 45 mL/min — ABNORMAL LOW (ref >60)
GFR calc Af Amer: 46 mL/min — ABNORMAL LOW (ref >60)
GFR calc Af Amer: 51 mL/min — ABNORMAL LOW (ref >60)
GFR calc non Af Amer: 40 mL/min — ABNORMAL LOW (ref >60)
GFR, EST NON AFRICAN AMERICAN: 39 mL/min — AB (ref >60)
GFR, EST NON AFRICAN AMERICAN: 44 mL/min — AB (ref >60)
GLUCOSE: 196 mg/dL — AB (ref 65–99)
GLUCOSE: 141 mg/dL — AB (ref 65–99)
Glucose, Bld: 149 mg/dL — ABNORMAL HIGH (ref 65–99)
POTASSIUM: 4.2 mmol/L (ref 3.5–5.1)
POTASSIUM: 4.2 mmol/L (ref 3.5–5.1)
Potassium: 3.8 mmol/L (ref 3.5–5.1)
Sodium: 136 mmol/L (ref 135–145)
Sodium: 136 mmol/L (ref 135–145)
Sodium: 134 mmol/L — ABNORMAL LOW (ref 135–145)

## 2016-10-06 LAB — GLUCOSE, CAPILLARY
GLUCOSE-CAPILLARY: 156 mg/dL — AB (ref 65–99)
GLUCOSE-CAPILLARY: 174 mg/dL — AB (ref 65–99)
GLUCOSE-CAPILLARY: 197 mg/dL — AB (ref 65–99)
GLUCOSE-CAPILLARY: 152 mg/dL — AB (ref 65–99)
GLUCOSE-CAPILLARY: 154 mg/dL — AB (ref 65–99)
GLUCOSE-CAPILLARY: 149 mg/dL — AB (ref 65–99)
GLUCOSE-CAPILLARY: 195 mg/dL — AB (ref 65–99)
GLUCOSE-CAPILLARY: 270 mg/dL — AB (ref 65–99)
Glucose-Capillary: 116 mg/dL — ABNORMAL HIGH (ref 65–99)
Glucose-Capillary: 119 mg/dL — ABNORMAL HIGH (ref 65–99)
Glucose-Capillary: 188 mg/dL — ABNORMAL HIGH (ref 65–99)
Glucose-Capillary: 174 mg/dL — ABNORMAL HIGH (ref 65–99)
Glucose-Capillary: 116 mg/dL — ABNORMAL HIGH (ref 65–99)
Glucose-Capillary: 146 mg/dL — ABNORMAL HIGH (ref 65–99)
Glucose-Capillary: 176 mg/dL — ABNORMAL HIGH (ref 65–99)
Glucose-Capillary: 304 mg/dL — ABNORMAL HIGH (ref 65–99)

## 2016-10-06 LAB — CBC
HEMATOCRIT: 34.8 % — AB (ref 36.0–46.0)
Hemoglobin: 11.5 g/dL — ABNORMAL LOW (ref 12.0–15.0)
MCH: 28 pg (ref 26.0–34.0)
MCHC: 33 g/dL (ref 30.0–36.0)
MCV: 84.9 fL (ref 78.0–100.0)
PLATELETS: 181 10*3/uL (ref 150–400)
RBC: 4.1 MIL/uL (ref 3.87–5.11)
RDW: 14.1 % (ref 11.5–15.5)
WBC: 8.2 10*3/uL (ref 4.0–10.5)

## 2016-10-06 MED ORDER — CARVEDILOL 6.25 MG PO TABS
6.2500 mg | ORAL_TABLET | Freq: Two times a day (BID) | ORAL | Status: DC
  Administered 2016-10-06 – 2016-10-08 (×5): 6.25 mg via ORAL
  Filled 2016-10-06 (×5): qty 1

## 2016-10-06 MED ORDER — SODIUM CHLORIDE 0.9 % IV BOLUS (SEPSIS)
1000.0000 mL | Freq: Once | INTRAVENOUS | Status: AC
  Administered 2016-10-06: 1000 mL via INTRAVENOUS

## 2016-10-06 MED ORDER — INSULIN GLARGINE 100 UNIT/ML ~~LOC~~ SOLN
40.0000 [IU] | Freq: Every day | SUBCUTANEOUS | Status: DC
  Administered 2016-10-06: 40 [IU] via SUBCUTANEOUS
  Filled 2016-10-06 (×2): qty 0.4

## 2016-10-06 MED ORDER — SODIUM CHLORIDE 0.9 % IV SOLN
INTRAVENOUS | Status: DC
  Administered 2016-10-06 – 2016-10-07 (×2): via INTRAVENOUS

## 2016-10-06 MED ORDER — DEXTROSE 10 % IV SOLN
INTRAVENOUS | Status: DC
  Administered 2016-10-06 (×2): via INTRAVENOUS
  Filled 2016-10-06 (×4): qty 1000

## 2016-10-06 MED ORDER — INSULIN ASPART 100 UNIT/ML ~~LOC~~ SOLN
0.0000 [IU] | Freq: Three times a day (TID) | SUBCUTANEOUS | Status: DC
  Administered 2016-10-06: 3 [IU] via SUBCUTANEOUS
  Administered 2016-10-07: 11 [IU] via SUBCUTANEOUS

## 2016-10-06 MED ORDER — INSULIN ASPART 100 UNIT/ML ~~LOC~~ SOLN
4.0000 [IU] | Freq: Once | SUBCUTANEOUS | Status: AC
Start: 2016-10-06 — End: 2016-10-06
  Administered 2016-10-06: 4 [IU] via SUBCUTANEOUS

## 2016-10-06 MED ORDER — INSULIN ASPART 100 UNIT/ML ~~LOC~~ SOLN
0.0000 [IU] | Freq: Every day | SUBCUTANEOUS | Status: DC
  Administered 2016-10-06: 3 [IU] via SUBCUTANEOUS

## 2016-10-06 MED ORDER — AMLODIPINE BESYLATE 10 MG PO TABS
10.0000 mg | ORAL_TABLET | Freq: Every day | ORAL | Status: DC
  Administered 2016-10-06 – 2016-10-09 (×4): 10 mg via ORAL
  Filled 2016-10-06 (×4): qty 1

## 2016-10-06 NOTE — Progress Notes (Signed)
Inpatient Diabetes Program Recommendations  AACE/ADA: New Consensus Statement on Inpatient Glycemic Control (2015)  Target Ranges:  Prepandial:   less than 140 mg/dL      Peak postprandial:   less than 180 mg/dL (1-2 hours)      Critically ill patients:  140 - 180 mg/dL   Lab Results  Component Value Date   GLUCAP 154 (H) 10/06/2016   HGBA1C 12.5 (H) 08/12/2016    Review of Glycemic Control  Noted that patient was admitted with DKA. Patient takes Novolin 70/30 insulin 40 units BID at home. Was previously on 30 units bid (this would be equal to approximately 34 units of Lantus or Levemir daily) Ready for transition off drip. Basal insulin to be given 1-2 hours prior to stopping drip. Will need Novolog  correction scale in place when patient comes off drip. Recommend Novolog 0-15 units correction scale TID & HS if eating and every 4 hours if NPO. Will follow blood sugars while in the hospital.   Thank you. Ailene Ardshonda Seher Schlagel, RD, LDN, CDE Inpatient Diabetes Coordinator [phone]

## 2016-10-06 NOTE — Progress Notes (Signed)
[name]  PROGRESS NOTE  [name]  RUE:[id number] DOB: 08/02/1966  DOA: 10/05/2016 PCP: Dois Davenport., MD   Brief Narrative:  51 year old female, PMH of type II DM/IDDM, status post renal transplant on immunosuppressants, chronic kidney disease (follows with Dr.Coladanato), HTN, hypothyroid, medical noncompliance issues, frequent hospitalization (7 admissions in the last 6 months), presented to ED with complaints of nausea, nonbloody emesis, cramping generalized abdominal pain which began 2 days prior to admission and progressively worsened and she stopped taking her insulin's 2 days prior to admission. Admitted to stepdown unit for DKA (initial labs: Glucose 9022, bicarbonate <7, anion gap 27, creatinine 2.06, potassium 7.1/hemolyzed, VBG pH 7.017. Improving.   Assessment & Plan:   Principal Problem:   DKA (diabetic ketoacidoses) (HCC) Active Problems:   Hyperkalemia   H/O kidney transplant   High anion gap metabolic acidosis   Essential hypertension   Acute renal failure superimposed on stage 3 chronic kidney disease (HCC)   Acute encephalopathy   1. DKA and poorly controlled type II DM/IDDM: Hemoglobin A1c 08/12/16:12.5. Over the last year, her A1c has never been less than 10.3. Presented with labs as above. Admitted to stepdown unit and treated per DKA protocol with aggressive IV fluids, insulin drip, frequent BMP monitoring. Anion gap has normalized. Glycemic control is improved and currently on D10 water. Bicarbonate is 18. Follow next BMP and if bicarbonate remains persistently low, consider brief bicarbonate drip. Transition to home 70/30 insulin with SSI Vs Lantus with SSI when able. Improving. Diabetes coordinator input appreciated and will be back to discuss with patient. 2. Nausea, vomiting and abdominal pain: DD: Secondary to DKA versus acute GE. Improved. Advance diet as tolerated after the DKA has completely resolved. 3. Acute on stage III chronic kidney disease/ S/P renal  transplant: Baseline creatinine is not entirely clear but may be in the 1.3-1.4 range. Presented with creatinine of 2.06. Secondary to DKA and dehydration. Improved. Creatinine 1.51 on 1/30. Continue immunosuppressants (prednisone, CellCept and Prograf). Outpatient follow-up with nephrology. 4. Hyperkalemia: Secondary to DKA and acute kidney injury. Resolved. 5. Dehydration: Secondary to DKA. Improved. Continue IV fluids. 6. Hypothyroid: Synthroid 7. Essential hypertension: Uncontrolled. Resume amlodipine at home dose and reduce dose of carvedilol 6.25 MG twice a day. Monitor. 8. GERD: Resume H2 blockers. 9. Anemia: Hemoglobin possibly at baseline and stable. 10. Hyponatremia: Secondary to dehydration and hyperglycemia. Resolved. 11. Chronic RBBB/prolonged QTC: Repeat QTC 492 ms on 1/30.    DVT prophylaxis: Subcutaneous heparin Code Status: Full Family Communication: None at bedside Disposition Plan: Continue management in stepdown unit for additional 24 hours.   Consultants:   None  Procedures:   None  Antimicrobials:   None    Subjective: Feels better. Mild nausea overnight but none this morning. No vomiting since admission or abdominal pain. Denies any other complaints. As per RN, no acute issues reported.  Objective:  Vitals:   10/06/16 0600 10/06/16 0700 10/06/16 0800 10/06/16 0835  BP: (!) 176/81 (!) 127/54 (!) 149/50   Pulse: 93 88 95 96  Resp: 19 18 20 10   Temp:   97.8 F (36.6 C)   TempSrc:   Oral   SpO2: 95% 95% 93% (!) 89%  Weight:      Height:        Intake/Output Summary (Last 24 hours) at 10/06/16 1017 Last data filed at 10/06/16 0800  Gross per 24 hour  Intake          5418.07 ml  Output  0 ml  Net          5418.07 ml   Filed Weights   10/05/16 0913  Weight: 88 kg (194 lb)    Examination:  General exam: Pleasant young female, looks older than stated age, moderately built and nourished, lying comfortably supine in  bed. Respiratory system: Clear to auscultation. Respiratory effort normal. Cardiovascular system: S1 & S2 heard, RRR. No JVD, murmurs, rubs, gallops or clicks. No pedal edema. Telemetry: Sinus rhythm. Gastrointestinal system: Abdomen is nondistended, soft and nontender. No organomegaly or masses felt. Normal bowel sounds heard. Central nervous system: Alert and oriented. No focal neurological deficits. Extremities: Symmetric 5 x 5 power. Skin: No rashes, lesions or ulcers Psychiatry: Judgement and insight appear normal. Mood & affect appropriate.     Data Reviewed: I have personally reviewed following labs and imaging studies  CBC:  Recent Labs Lab 10/05/16 1152 10/05/16 1557 10/06/16 0242  WBC 11.2* 11.5* 8.2  NEUTROABS 9.4*  --   --   HGB 11.0* 12.4 11.5*  HCT 33.3* 38.2 34.8*  MCV 88.1 89.7 84.9  PLT 202 195 181   Basic Metabolic Panel:  Recent Labs Lab 10/05/16 1152 10/05/16 1557 10/05/16 1818 10/05/16 2241 10/06/16 0242 10/06/16 0609  NA 131*  --  138 136 136 136  K 6.3*  --  4.6 4.1 4.2 4.2  CL 97*  --  106 109 109 110  CO2 <7*  --  13* 17* 16* 18*  GLUCOSE 828*  --  235* 112* 149* 196*  BUN 33*  --  32* 31* 28* 29*  CREATININE 1.91* 1.92* 2.05* 1.69* 1.54* 1.51*  CALCIUM 9.8  --  9.4 9.4 9.0 9.2   GFR: Estimated Creatinine Clearance: 52.3 mL/min (by C-G formula based on SCr of 1.51 mg/dL (H)). Liver Function Tests:  Recent Labs Lab 10/05/16 0919  AST 12*  ALT 8*  ALKPHOS 147*  BILITOT 2.0*  PROT 7.7  ALBUMIN 3.1*    Recent Labs Lab 10/05/16 0919  LIPASE 15   No results for input(s): AMMONIA in the last 168 hours. Coagulation Profile: No results for input(s): INR, PROTIME in the last 168 hours. Cardiac Enzymes: No results for input(s): CKTOTAL, CKMB, CKMBINDEX, TROPONINI in the last 168 hours. BNP (last 3 results) No results for input(s): PROBNP in the last 8760 hours. HbA1C: No results for input(s): HGBA1C in the last 72  hours. CBG:  Recent Labs Lab 10/06/16 0453 10/06/16 0600 10/06/16 0708 10/06/16 0810 10/06/16 0922  GLUCAP 188* 197* 174* 152* 154*   Lipid Profile: No results for input(s): CHOL, HDL, LDLCALC, TRIG, CHOLHDL, LDLDIRECT in the last 72 hours. Thyroid Function Tests: No results for input(s): TSH, T4TOTAL, FREET4, T3FREE, THYROIDAB in the last 72 hours. Anemia Panel: No results for input(s): VITAMINB12, FOLATE, FERRITIN, TIBC, IRON, RETICCTPCT in the last 72 hours.  Sepsis Labs:  Recent Labs Lab 10/05/16 1152 10/05/16 1557 10/06/16 0242  WBC 11.2* 11.5* 8.2    Recent Results (from the past 240 hour(s))  MRSA PCR Screening     Status: Abnormal   Collection Time: 10/05/16  1:10 PM  Result Value Ref Range Status   MRSA by PCR POSITIVE (A) NEGATIVE Final    Comment:        The GeneXpert MRSA Assay (FDA approved for NASAL specimens only), is one component of a comprehensive MRSA colonization surveillance program. It is not intended to diagnose MRSA infection nor to guide or monitor treatment for MRSA infections. RESULT CALLED TO,  READ BACK BY AND VERIFIED WITH: MCNABB,B @ 1543 ON [id number] BY POTEAT,S          Radiology Studies: Dg Chest Portable 1 View  Result Date: 10/05/2016 CLINICAL DATA:  Shortness of breath today. EXAM: PORTABLE CHEST 1 VIEW COMPARISON:  PA and lateral chest 08/12/2016 and single view of the chest 04/15/2016. FINDINGS: The lungs are clear. Heart size is normal. No pneumothorax or pleural effusion. No acute bony abnormality. IMPRESSION: Negative chest. Electronically Signed   By: Drusilla Kannerhomas  Dalessio M.D.   On: 10/05/2016 10:07        Scheduled Meds: . amLODipine  10 mg Oral Daily  . aspirin EC  81 mg Oral Daily  . carvedilol  6.25 mg Oral BID WC  . Chlorhexidine Gluconate Cloth  6 each Topical Q0600  . fluticasone  1 spray Each Nare Daily  . heparin  5,000 Units Subcutaneous Q8H  . insulin regular  0-10 Units Intravenous TID WC  .  levothyroxine  137 mcg Oral QAC breakfast  . loratadine  10 mg Oral Daily  . mupirocin ointment  1 application Nasal BID  . mycophenolate  500 mg Oral BID  . predniSONE  5 mg Oral QHS  . sodium chloride flush  3 mL Intravenous Q12H  . tacrolimus  0.5 mg Oral QPM  . tacrolimus  1 mg Oral Daily   Continuous Infusions: . dextrose 100 mL/hr at 10/06/16 0800  . insulin (NOVOLIN-R) infusion 3.5 Units/hr (10/06/16 0810)     LOS: 1 day       Cornerstone Surgicare LLCNGALGI,Angelus Hoopes, MD [facility] Pager [phone]  If 7PM-7AM, please contact night-coverage [email or web address] Password [email or web address] 10/06/2016, 10:17 AM

## 2016-10-07 LAB — CBC
HEMATOCRIT: 34.8 % — AB (ref 36.0–46.0)
HEMOGLOBIN: 11.4 g/dL — AB (ref 12.0–15.0)
MCH: 28.1 pg (ref 26.0–34.0)
MCHC: 32.8 g/dL (ref 30.0–36.0)
MCV: 85.7 fL (ref 78.0–100.0)
Platelets: 144 10*3/uL — ABNORMAL LOW (ref 150–400)
RBC: 4.06 MIL/uL (ref 3.87–5.11)
RDW: 14.9 % (ref 11.5–15.5)
WBC: 3.4 10*3/uL — AB (ref 4.0–10.5)

## 2016-10-07 LAB — COMPREHENSIVE METABOLIC PANEL
ALBUMIN: 2.3 g/dL — AB (ref 3.5–5.0)
ALK PHOS: 106 U/L (ref 38–126)
ALT: 9 U/L — AB (ref 14–54)
AST: 13 U/L — AB (ref 15–41)
Anion gap: 7 (ref 5–15)
BILIRUBIN TOTAL: 0.4 mg/dL (ref 0.3–1.2)
BUN: 23 mg/dL — AB (ref 6–20)
CO2: 19 mmol/L — ABNORMAL LOW (ref 22–32)
CREATININE: 1.38 mg/dL — AB (ref 0.44–1.00)
Calcium: 9.4 mg/dL (ref 8.9–10.3)
Chloride: 109 mmol/L (ref 101–111)
GFR calc Af Amer: 51 mL/min — ABNORMAL LOW (ref >60)
GFR calc non Af Amer: 44 mL/min — ABNORMAL LOW (ref >60)
Glucose, Bld: 263 mg/dL — ABNORMAL HIGH (ref 65–99)
POTASSIUM: 4.4 mmol/L (ref 3.5–5.1)
Sodium: 135 mmol/L (ref 135–145)
TOTAL PROTEIN: 6.1 g/dL — AB (ref 6.5–8.1)

## 2016-10-07 LAB — GLUCOSE, CAPILLARY
GLUCOSE-CAPILLARY: 123 mg/dL — AB (ref 65–99)
GLUCOSE-CAPILLARY: 53 mg/dL — AB (ref 65–99)
GLUCOSE-CAPILLARY: 57 mg/dL — AB (ref 65–99)
GLUCOSE-CAPILLARY: 140 mg/dL — AB (ref 65–99)
GLUCOSE-CAPILLARY: 74 mg/dL (ref 65–99)
Glucose-Capillary: 337 mg/dL — ABNORMAL HIGH (ref 65–99)
Glucose-Capillary: 164 mg/dL — ABNORMAL HIGH (ref 65–99)

## 2016-10-07 MED ORDER — INSULIN ASPART PROT & ASPART (70-30 MIX) 100 UNIT/ML ~~LOC~~ SUSP
35.0000 [IU] | Freq: Two times a day (BID) | SUBCUTANEOUS | Status: DC
  Administered 2016-10-07: 35 [IU] via SUBCUTANEOUS
  Filled 2016-10-07: qty 10

## 2016-10-07 MED ORDER — DOCUSATE SODIUM 100 MG PO CAPS
100.0000 mg | ORAL_CAPSULE | Freq: Two times a day (BID) | ORAL | Status: DC
  Administered 2016-10-07 – 2016-10-09 (×5): 100 mg via ORAL
  Filled 2016-10-07 (×5): qty 1

## 2016-10-07 MED ORDER — DEXTROSE 50 % IV SOLN
25.0000 mL | Freq: Once | INTRAVENOUS | Status: AC
  Administered 2016-10-07: 25 mL via INTRAVENOUS

## 2016-10-07 MED ORDER — INSULIN ASPART PROT & ASPART (70-30 MIX) 100 UNIT/ML ~~LOC~~ SUSP
15.0000 [IU] | Freq: Every day | SUBCUTANEOUS | Status: DC
  Administered 2016-10-07 – 2016-10-08 (×2): 15 [IU] via SUBCUTANEOUS

## 2016-10-07 MED ORDER — INSULIN ASPART PROT & ASPART (70-30 MIX) 100 UNIT/ML ~~LOC~~ SUSP: 35.0000 [IU] | Freq: Every day | SUBCUTANEOUS | Status: DC

## 2016-10-07 MED ORDER — INSULIN ASPART 100 UNIT/ML ~~LOC~~ SOLN
0.0000 [IU] | Freq: Three times a day (TID) | SUBCUTANEOUS | Status: DC
  Administered 2016-10-07 – 2016-10-09 (×3): 2 [IU] via SUBCUTANEOUS

## 2016-10-07 NOTE — Consult Note (Signed)
   [facility]   10/07/2016  [name] 1965/11/09 [id number]    Came to visit [name] once again to discuss re-engaging [facility]. [facility]. Asked her why did she stop taking her insulin. She states " I ran out". Writer informed her that this is something [facility] could possibly assist with. She states, " I am [facility]. This is something I am working on. I do not need your services for that.". Provided [facility] once with contact information. Will make inpatient RNCM aware that Ms. [name] [facility].    Raiford NobleAtika Hall, MSN-Ed, RN,BSN [facility] Hospital Liaison [phone]

## 2016-10-07 NOTE — Progress Notes (Addendum)
CSW availble if social service needs arise.   Vivi BarrackNicole Vadie Principato, Theresia MajorsLCSWA, MSW Clinical Social Worker 5E and Psychiatric Service Line [phone] 10/07/2016  8:51 AM

## 2016-10-07 NOTE — Progress Notes (Signed)
CRITICAL VALUE ALERT  Critical value received:  CBG 53  Date of notification:  10/07/2016  Time of notification:  1658  Critical value read back:Yes.    Nurse who received alert:  Modesto CharonBableen Aamori Mcmasters  MD notified (1st page):  Hongalgi  Time of first page:  MD made aware on unit  MD notified (2nd page):  Time of second page:  Responding MD:    Time MD responded:    Hypoglycemic protocol followed D50 25ml given  Follow up CBG 123.

## 2016-10-07 NOTE — Progress Notes (Signed)
CRITICAL VALUE ALERT  Critical value received:  CBG 57  Date of notification:  10/07/2016  Time of notification:  1641  Critical value read back:Yes.    Nurse who received alert:  Modesto CharonBableen Avyn Aden  MD notified (1st page):  Hongalgi  Time of first page:  1641  MD notified (2nd page):  Time of second page:  Responding MD:  Hongalgi  Time MD responded:  1641  Hypoglycemic protocol Patient given 4 oz of orange juice.  Will recheck CBG in 15 mins.

## 2016-10-07 NOTE — Progress Notes (Signed)
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 Admitted to stepdown unit for DKA (initial labs: Glucose 9022, bicarbonate <7, anion gap 27, creatinine 2.06, potassium 7.1/hemolyzed, VBG pH 7.017. Improving.   Assessment & Plan:   Principal Problem:   DKA (diabetic ketoacidoses) (HCC) Active Problems:   Hyperkalemia   H/O kidney transplant   High anion gap metabolic acidosis   Essential hypertension   Acute renal failure superimposed on stage 3 chronic kidney disease (HCC)   Acute encephalopathy   1. DKA and poorly controlled type II DM/IDDM: Hemoglobin A1c 08/12/16:12.5. Over the last year, her A1c has never been less than 10.3. Presented with labs as above. Admitted to stepdown unit and treated per DKA protocol with aggressive IV fluids, insulin drip, frequent BMP monitoring. Anion gap has normalized. Glycemic control is improved and currently on D10 water. Bicarbonate is 18. Diabetes coordinator input appreciated. DKA resolved and patient was initially transitioned to Lantus 40 units daily and SSI on 1/30. Patient states that she usually takes 70/30 insulin at 35 units twice daily but is not fully compliant for no particular reason. On 1/31, discontinued Lantus and resumed 70/30 insulin at home dose. Monitor closely. Counseled extensively regarding need for medication compliance. She verbalized understanding. 2. Nausea, vomiting and  abdominal pain: DD: Secondary to DKA versus acute GE. Resolved. 3. Acute on stage III chronic kidney disease/ S/P renal transplant: Baseline creatinine is not entirely clear but may be in the 1.3-1.4 range. Presented with creatinine of 2.06. Secondary to DKA and dehydration. Improved. Creatinine 1.51 on 1/30. Continue immunosuppressants (prednisone, CellCept and Prograf). Outpatient follow-up with nephrology. Creatinine may be at baseline. Periodically follow BMP. 4. Hyperkalemia: Secondary to DKA and acute kidney injury. Resolved. 5. Dehydration: Secondary to DKA. Improved. Resolved after IV fluids. Encouraged oral fluid intake. 6. Hypothyroid: Synthroid 7. Essential hypertension: Uncontrolled. Resumed amlodipine at home dose and reduce dose of carvedilol 6.25 MG twice a day. Monitor. Better. Consider increasing carvedilol to prior home dose of 12.5 mg twice a day. 8. GERD: H2 blockers and PPI on hold secondary to prolonged QTC. 9. Anemia/pancytopenia: Hemoglobin possibly at baseline and stable. Follow CBC in a.m. 10. Hyponatremia: Secondary to dehydration and hyperglycemia. Resolved. 11. Chronic RBBB/prolonged QTC: Repeat QTC 492 ms on 1/30.    DVT prophylaxis: Subcutaneous heparin Code Status: Full Family Communication: None at bedside Disposition Plan: Admitted to stepdown unit. Transferred to floor on 1/30. Possible DC home, 2/1.   Consultants:   None  Procedures:   None  Antimicrobials:   None    Subjective: Denies complaints. Tolerating diet. No nausea, vomiting or abdominal pain. States that she usually feels her insulin prescriptions at [facility]. She runs out of her insulin's frequently and that's when she starts developing nausea, vomiting and abdominal pain like she did prior to this admission. She gives no specific reason as to why she cannot fill her prescriptions in time.   Objective:  Vitals:   10/06/16 2000 10/06/16 2140 10/07/16 0449 10/07/16 1431  BP: (!) 156/69  137/82 (!) 156/76 (!) 155/83  Pulse: 84 91 84 84  Resp: 20 18 18 18   Temp: 97.1 F (36.2 C) 97.5 F (36.4 C) 97.9 F (36.6 C) 98.1 F (36.7 C)  TempSrc: Oral Oral Oral Oral  SpO2: 98% 97% 99% 98%  Weight:      Height:        Intake/Output Summary (Last 24 hours) at 10/07/16 1435 Last data filed at 10/07/16 0935  Gross per 24 hour  Intake          2245.12 ml  Output                0 ml  Net          2245.12 ml   Filed Weights   10/05/16 0913  Weight: 88 kg (194 lb)    Examination:  General exam: Pleasant young female, looks older than stated age, moderately built and nourished, lying comfortably supine in bed. Respiratory system: Clear to auscultation. Respiratory effort normal. Cardiovascular system: S1 & S2 heard, RRR. No JVD, murmurs, rubs, gallops or clicks. No pedal edema.  Gastrointestinal system: Abdomen is nondistended, soft and nontender. No organomegaly or masses felt. Normal bowel sounds heard. Central nervous system: Alert and oriented. No focal neurological deficits. Extremities: Symmetric 5 x 5 power. Skin: No rashes, lesions or ulcers Psychiatry: Judgement and insight appear normal. Mood & affect appropriate.     Data Reviewed: I have personally reviewed following labs and imaging studies  CBC:  Recent Labs Lab 10/05/16 1152 10/05/16 1557 10/06/16 0242 10/07/16 0607  WBC 11.2* 11.5* 8.2 3.4*  NEUTROABS 9.4*  --   --   --   HGB 11.0* 12.4 11.5* 11.4*  HCT 33.3* 38.2 34.8* 34.8*  MCV 88.1 89.7 84.9 85.7  PLT 202 195 181 144*   Basic Metabolic Panel:  Recent Labs Lab 10/05/16 2241 10/06/16 0242 10/06/16 0609 10/06/16 1125 10/07/16 0607  NA 136 136 136 134* 135  K 4.1 4.2 4.2 3.8 4.4  CL 109 109 110 111 109  CO2 17* 16* 18* 20* 19*  GLUCOSE 112* 149* 196* 141* 263*  BUN 31* 28* 29* 26* 23*  CREATININE 1.69* 1.54* 1.51* 1.38* 1.38*  CALCIUM 9.4 9.0 9.2 9.0 9.4   GFR: Estimated Creatinine Clearance: 57.2 mL/min (by C-G formula based  on SCr of 1.38 mg/dL (H)). Liver Function Tests:  Recent Labs Lab 10/05/16 0919 10/07/16 0607  AST 12* 13*  ALT 8* 9*  ALKPHOS 147* 106  BILITOT 2.0* 0.4  PROT 7.7 6.1*  ALBUMIN 3.1* 2.3*    Recent Labs Lab 10/05/16 0919  LIPASE 15   No results for input(s): AMMONIA in the last 168 hours. Coagulation Profile: No results for input(s): INR, PROTIME in the last 168 hours. Cardiac Enzymes: No results for input(s): CKTOTAL, CKMB, CKMBINDEX, TROPONINI in the last 168 hours. BNP (last 3 results) No results for input(s): PROBNP in the last 8760 hours. HbA1C: No results for input(s): HGBA1C in the last 72 hours. CBG:  Recent Labs Lab 10/06/16 1644 10/06/16 2016 10/06/16 2145 10/07/16 0743 10/07/16 1131  GLUCAP 195* 270* 304* 337* 164*   Lipid Profile: No results for input(s): CHOL, HDL, LDLCALC, TRIG, CHOLHDL, LDLDIRECT in the last 72 hours. Thyroid Function Tests: No results for input(s): TSH, T4TOTAL, FREET4, T3FREE, THYROIDAB in the last 72 hours. Anemia Panel: No results for input(s): VITAMINB12, FOLATE, FERRITIN, TIBC, IRON, RETICCTPCT in the last 72 hours.  Sepsis Labs:  Recent Labs Lab 10/05/16 1152 10/05/16  1557 10/06/16 0242 10/07/16 0607  WBC 11.2* 11.5* 8.2 3.4*    Recent Results (from the past 240 hour(s))  MRSA PCR Screening     Status: Abnormal   Collection Time: 10/05/16  1:10 PM  Result Value Ref Range Status   MRSA by PCR POSITIVE (A) NEGATIVE Final    Comment:        The GeneXpert MRSA Assay (FDA approved for NASAL specimens only), is one component of a comprehensive MRSA colonization surveillance program. It is not intended to diagnose MRSA infection nor to guide or monitor treatment for MRSA infections. RESULT CALLED TO, READ BACK BY AND VERIFIED WITH: MCNABB,B @ 1543 ON [id number] BY POTEAT,S          Radiology Studies: No results found.      Scheduled Meds: . amLODipine  10 mg Oral Daily  . aspirin EC  81 mg Oral  Daily  . carvedilol  6.25 mg Oral BID WC  . Chlorhexidine Gluconate Cloth  6 each Topical Q0600  . docusate sodium  100 mg Oral BID  . fluticasone  1 spray Each Nare Daily  . heparin  5,000 Units Subcutaneous Q8H  . insulin aspart  0-9 Units Subcutaneous TID WC  . insulin aspart protamine- aspart  35 Units Subcutaneous BID WC  . levothyroxine  137 mcg Oral QAC breakfast  . loratadine  10 mg Oral Daily  . mupirocin ointment  1 application Nasal BID  . mycophenolate  500 mg Oral BID  . predniSONE  5 mg Oral QHS  . sodium chloride flush  3 mL Intravenous Q12H  . tacrolimus  0.5 mg Oral QPM  . tacrolimus  1 mg Oral Daily   Continuous Infusions:    LOS: 2 days       [facility], MD [facility] Pager [phone]  If 7PM-7AM, please contact night-coverage [email or web address] Password [email or web address] 10/07/2016, 2:35 PM

## 2016-10-08 LAB — CBC
HCT: 33.8 % — ABNORMAL LOW (ref 36.0–46.0)
Hemoglobin: 11.1 g/dL — ABNORMAL LOW (ref 12.0–15.0)
MCH: 28.2 pg (ref 26.0–34.0)
MCHC: 32.8 g/dL (ref 30.0–36.0)
MCV: 85.8 fL (ref 78.0–100.0)
PLATELETS: 133 10*3/uL — AB (ref 150–400)
RBC: 3.94 MIL/uL (ref 3.87–5.11)
RDW: 14.7 % (ref 11.5–15.5)
WBC: 4 10*3/uL (ref 4.0–10.5)

## 2016-10-08 LAB — BASIC METABOLIC PANEL
ANION GAP: 7 (ref 5–15)
BUN: 20 mg/dL (ref 6–20)
CALCIUM: 9.4 mg/dL (ref 8.9–10.3)
CO2: 22 mmol/L (ref 22–32)
Chloride: 111 mmol/L (ref 101–111)
Creatinine, Ser: 1.18 mg/dL — ABNORMAL HIGH (ref 0.44–1.00)
GFR, EST NON AFRICAN AMERICAN: 53 mL/min — AB (ref >60)
GLUCOSE: 56 mg/dL — AB (ref 65–99)
POTASSIUM: 4 mmol/L (ref 3.5–5.1)
Sodium: 140 mmol/L (ref 135–145)

## 2016-10-08 LAB — GLUCOSE, CAPILLARY
GLUCOSE-CAPILLARY: 64 mg/dL — AB (ref 65–99)
Glucose-Capillary: 104 mg/dL — ABNORMAL HIGH (ref 65–99)
Glucose-Capillary: 58 mg/dL — ABNORMAL LOW (ref 65–99)
Glucose-Capillary: 103 mg/dL — ABNORMAL HIGH (ref 65–99)
Glucose-Capillary: 127 mg/dL — ABNORMAL HIGH (ref 65–99)
Glucose-Capillary: 125 mg/dL — ABNORMAL HIGH (ref 65–99)

## 2016-10-08 LAB — MAGNESIUM: MAGNESIUM: 1.4 mg/dL — AB (ref 1.7–2.4)

## 2016-10-08 MED ORDER — INSULIN ASPART PROT & ASPART (70-30 MIX) 100 UNIT/ML ~~LOC~~ SUSP
15.0000 [IU] | Freq: Every day | SUBCUTANEOUS | Status: DC
  Administered 2016-10-08 – 2016-10-09 (×2): 15 [IU] via SUBCUTANEOUS

## 2016-10-08 MED ORDER — CARVEDILOL 12.5 MG PO TABS
12.5000 mg | ORAL_TABLET | Freq: Two times a day (BID) | ORAL | Status: DC
  Administered 2016-10-08 – 2016-10-09 (×2): 12.5 mg via ORAL
  Filled 2016-10-08 (×2): qty 1

## 2016-10-08 MED ORDER — MAGNESIUM SULFATE 4 GM/100ML IV SOLN
4.0000 g | Freq: Once | INTRAVENOUS | Status: AC
  Administered 2016-10-08: 4 g via INTRAVENOUS
  Filled 2016-10-08: qty 100

## 2016-10-08 NOTE — Progress Notes (Signed)
Inpatient Diabetes Program Recommendations  AACE/ADA: New Consensus Statement on Inpatient Glycemic Control (2015)  Target Ranges:  Prepandial:   less than 140 mg/dL      Peak postprandial:   less than 180 mg/dL (1-2 hours)      Critically ill patients:  140 - 180 mg/dL   Lab Results  Component Value Date   GLUCAP 104 (H) 10/08/2016   HGBA1C 12.5 (H) 08/12/2016    Review of Glycemic Control  Spoke with pt at length regarding taking her insulin at home.  Pt states she "sometimes forgets" to take it. Ran out 2 days prior to admission. States she couldn't go and get it from pharmacy. Sounds like very little support at home even though husband has DM.  Discussed importance of glucose control at home. Discussed HgbA1C.  Hypoglycemia again this morning. MD changed orders to 70/30 15 units bid.  Will follow. Thank you. Ailene Ardshonda Dagan Heinz, RD, LDN, CDE Inpatient Diabetes Coordinator [phone]

## 2016-10-08 NOTE — Progress Notes (Signed)
[name]  PROGRESS NOTE  [name]  ZOX:[id number] DOB: 07/02/1966  DOA: 10/05/2016 PCP: Dois Davenport., MD   Brief Narrative:  51 year old female, PMH of type II DM/IDDM, status post renal transplant on immunosuppressants, chronic kidney disease (follows with Dr.Coladanato), HTN, hypothyroid, medical noncompliance issues, frequent hospitalization (7 admissions in the last 6 months), presented to ED with complaints of nausea, nonbloody emesis, cramping generalized abdominal pain which began 2 days prior to admission and progressively worsened and she stopped taking her insulin's 2 days prior to admission. Admitted to stepdown unit for DKA (initial labs: Glucose 9022, bicarbonate <7, anion gap 27, creatinine 2.06, potassium 7.1/hemolyzed, VBG pH 7.017. Improving.   Assessment & Plan:   Principal Problem:   DKA (diabetic ketoacidoses) (HCC) Active Problems:   Hyperkalemia   H/O kidney transplant   High anion gap metabolic acidosis   Essential hypertension   Acute renal failure superimposed on stage 3 chronic kidney disease (HCC)   Acute encephalopathy   1. DKA and poorly controlled type II DM/IDDM: Hemoglobin A1c 08/12/16:12.5. Over the last year, her A1c has never been less than 10.3. Presented with labs as above. Admitted to stepdown unit and treated per DKA protocol with aggressive IV fluids, insulin drip, frequent BMP monitoring. Anion gap has normalized. Glycemic control is improved and currently on D10 water. Bicarbonate is 18. Diabetes coordinator input appreciated. DKA resolved and patient was initially transitioned to Lantus 40 units daily and SSI on 1/30. Patient states that she usually takes 70/30 insulin at 35 units twice daily but is not fully compliant for no particular reason. On 1/31, discontinued Lantus and resumed 70/30 insulin at home dose. Since transitioning to reported home dose of 70/30 insulin, has had couple of episodes of hypoglycemia. Suspect that she is noncompliant at  home. Reduced 70/30 insulin to 15 units twice a day. Monitor closely. Discussed with diabetes coordinator. 2. Nausea, vomiting and abdominal pain: DD: Secondary to DKA versus acute GE. Resolved. 3. Acute on stage III chronic kidney disease/ S/P renal transplant: Baseline creatinine is not entirely clear but may be in the 1.3-1.4 range. Presented with creatinine of 2.06. Secondary to DKA and dehydration. Improved. Creatinine 1.51 on 1/30. Continue immunosuppressants (prednisone, CellCept and Prograf). Outpatient follow-up with nephrology. Creatinine normalized to 1.18. 4. Hyperkalemia: Secondary to DKA and acute kidney injury. Resolved. 5. Dehydration: Secondary to DKA. Improved. Resolved after IV fluids. Encouraged oral fluid intake. 6. Hypothyroid: Synthroid 7. Essential hypertension: Uncontrolled. Resumed amlodipine at home dose and reduce dose of carvedilol 6.25 MG twice a day. Monitor. Better. Consider increasing carvedilol to prior home dose of 12.5 mg twice a day. 8. GERD: H2 blockers and PPI on hold secondary to prolonged QTC. 9. Anemia/pancytopenia: Hemoglobin possibly at baseline and stable. Follow CBC in a.m. 10. Hyponatremia: Secondary to dehydration and hyperglycemia. Resolved. 11. Chronic RBBB/prolonged QTC: Repeat QTC 492 ms on 1/30.    DVT prophylaxis: Subcutaneous heparin Code Status: Full Family Communication: None at bedside Disposition Plan: Admitted to stepdown unit. Transferred to floor on 1/30. Possible DC home pending stability of glycemic control,? 2/2.   Consultants:   None  Procedures:   None  Antimicrobials:   None    Subjective: Hypoglycemic episode yesterday evening and this morning. States that she is eating well. Denies any other complaints.  Objective:  Vitals:   10/07/16 0449 10/07/16 1431 10/07/16 2100 10/08/16 0411  BP: (!) 156/76 (!) 155/83 (!) 176/91 (!) 160/87  Pulse: 84 84 82 83  Resp: 18 18 18  18  Temp: 97.9 F (36.6 C) 98.1 F (36.7  C) 98.5 F (36.9 C) 98 F (36.7 C)  TempSrc: Oral Oral Oral Oral  SpO2: 99% 98% 100% 100%  Weight:      Height:        Intake/Output Summary (Last 24 hours) at 10/08/16 1407 Last data filed at 10/08/16 1301  Gross per 24 hour  Intake              600 ml  Output                0 ml  Net              600 ml   Filed Weights   10/05/16 0913  Weight: 88 kg (194 lb)    Examination:  General exam: Pleasant young female, looks older than stated age, moderately built and nourished, lying comfortably supine in bed. Respiratory system: Clear to auscultation. Respiratory effort normal. Cardiovascular system: S1 & S2 heard, RRR. No JVD, murmurs, rubs, gallops or clicks. No pedal edema.  Gastrointestinal system: Abdomen is nondistended, soft and nontender. No organomegaly or masses felt. Normal bowel sounds heard. Central nervous system: Alert and oriented. No focal neurological deficits. Extremities: Symmetric 5 x 5 power. Skin: No rashes, lesions or ulcers Psychiatry: Judgement and insight appear normal. Mood & affect appropriate.     Data Reviewed: I have personally reviewed following labs and imaging studies  CBC:  Recent Labs Lab 10/05/16 1152 10/05/16 1557 10/06/16 0242 10/07/16 0607 10/08/16 0530  WBC 11.2* 11.5* 8.2 3.4* 4.0  NEUTROABS 9.4*  --   --   --   --   HGB 11.0* 12.4 11.5* 11.4* 11.1*  HCT 33.3* 38.2 34.8* 34.8* 33.8*  MCV 88.1 89.7 84.9 85.7 85.8  PLT 202 195 181 144* 133*   Basic Metabolic Panel:  Recent Labs Lab 10/06/16 0242 10/06/16 0609 10/06/16 1125 10/07/16 0607 10/08/16 0530  NA 136 136 134* 135 140  K 4.2 4.2 3.8 4.4 4.0  CL 109 110 111 109 111  CO2 16* 18* 20* 19* 22  GLUCOSE 149* 196* 141* 263* 56*  BUN 28* 29* 26* 23* 20  CREATININE 1.54* 1.51* 1.38* 1.38* 1.18*  CALCIUM 9.0 9.2 9.0 9.4 9.4   GFR: Estimated Creatinine Clearance: 66.9 mL/min (by C-G formula based on SCr of 1.18 mg/dL (H)). Liver Function Tests:  Recent  Labs Lab 10/05/16 0919 10/07/16 0607  AST 12* 13*  ALT 8* 9*  ALKPHOS 147* 106  BILITOT 2.0* 0.4  PROT 7.7 6.1*  ALBUMIN 3.1* 2.3*    Recent Labs Lab 10/05/16 0919  LIPASE 15   No results for input(s): AMMONIA in the last 168 hours. Coagulation Profile: No results for input(s): INR, PROTIME in the last 168 hours. Cardiac Enzymes: No results for input(s): CKTOTAL, CKMB, CKMBINDEX, TROPONINI in the last 168 hours. BNP (last 3 results) No results for input(s): PROBNP in the last 8760 hours. HbA1C: No results for input(s): HGBA1C in the last 72 hours. CBG:  Recent Labs Lab 10/07/16 2135 10/08/16 0732 10/08/16 0824 10/08/16 1140 10/08/16 1258  GLUCAP 74 64* 104* 58* 103*   Lipid Profile: No results for input(s): CHOL, HDL, LDLCALC, TRIG, CHOLHDL, LDLDIRECT in the last 72 hours. Thyroid Function Tests: No results for input(s): TSH, T4TOTAL, FREET4, T3FREE, THYROIDAB in the last 72 hours. Anemia Panel: No results for input(s): VITAMINB12, FOLATE, FERRITIN, TIBC, IRON, RETICCTPCT in the last 72 hours.  Sepsis Labs:  Recent Labs Lab  10/05/16 1557 10/06/16 0242 10/07/16 0607 10/08/16 0530  WBC 11.5* 8.2 3.4* 4.0    Recent Results (from the past 240 hour(s))  MRSA PCR Screening     Status: Abnormal   Collection Time: 10/05/16  1:10 PM  Result Value Ref Range Status   MRSA by PCR POSITIVE (A) NEGATIVE Final    Comment:        The GeneXpert MRSA Assay (FDA approved for NASAL specimens only), is one component of a comprehensive MRSA colonization surveillance program. It is not intended to diagnose MRSA infection nor to guide or monitor treatment for MRSA infections. RESULT CALLED TO, READ BACK BY AND VERIFIED WITH: MCNABB,B @ 1543 ON [id number] BY POTEAT,S          Radiology Studies: No results found.      Scheduled Meds: . amLODipine  10 mg Oral Daily  . aspirin EC  81 mg Oral Daily  . carvedilol  6.25 mg Oral BID WC  . Chlorhexidine Gluconate  Cloth  6 each Topical Q0600  . docusate sodium  100 mg Oral BID  . fluticasone  1 spray Each Nare Daily  . heparin  5,000 Units Subcutaneous Q8H  . insulin aspart  0-9 Units Subcutaneous TID WC  . insulin aspart protamine- aspart  15 Units Subcutaneous Q supper  . insulin aspart protamine- aspart  15 Units Subcutaneous Q breakfast  . levothyroxine  137 mcg Oral QAC breakfast  . loratadine  10 mg Oral Daily  . mupirocin ointment  1 application Nasal BID  . mycophenolate  500 mg Oral BID  . predniSONE  5 mg Oral QHS  . sodium chloride flush  3 mL Intravenous Q12H  . tacrolimus  0.5 mg Oral QPM  . tacrolimus  1 mg Oral Daily   Continuous Infusions:    LOS: 3 days       [facility], MD [facility] Pager [phone]  If 7PM-7AM, please contact night-coverage [email or web address] Password [email or web address] 10/08/2016, 2:07 PM

## 2016-10-09 LAB — GLUCOSE, CAPILLARY
GLUCOSE-CAPILLARY: 160 mg/dL — AB (ref 65–99)
Glucose-Capillary: 166 mg/dL — ABNORMAL HIGH (ref 65–99)

## 2016-10-09 LAB — CBC
HCT: 40.1 % (ref 36.0–46.0)
HEMOGLOBIN: 13.1 g/dL (ref 12.0–15.0)
MCH: 28.2 pg (ref 26.0–34.0)
MCHC: 32.7 g/dL (ref 30.0–36.0)
MCV: 86.2 fL (ref 78.0–100.0)
PLATELETS: 147 10*3/uL — AB (ref 150–400)
RBC: 4.65 MIL/uL (ref 3.87–5.11)
RDW: 14.7 % (ref 11.5–15.5)
WBC: 3.8 10*3/uL — ABNORMAL LOW (ref 4.0–10.5)

## 2016-10-09 LAB — MAGNESIUM: MAGNESIUM: 2 mg/dL (ref 1.7–2.4)

## 2016-10-09 MED ORDER — INSULIN NPH ISOPHANE & REGULAR (70-30) 100 UNIT/ML ~~LOC~~ SUSP: 15.0000 [IU] | Freq: Two times a day (BID) | SUBCUTANEOUS | Status: DC

## 2016-10-09 MED ORDER — CARVEDILOL 6.25 MG PO TABS: 12.5000 mg | ORAL_TABLET | Freq: Two times a day (BID) | ORAL | Status: DC

## 2016-10-09 NOTE — Discharge Summary (Signed)
Physician Discharge Summary  [name] ZOX:[id number] DOB: 03-Apr-1966  PCP: Dois DavenportICHTER,KAREN L., MD  Admit date: 10/05/2016 Discharge date: 10/09/2016  Recommendations for Outpatient Follow-up:  1. Dr. Nadyne CoombesKaren Richter, PCPIn 4 days with repeat labs (CBC & BMP). Will need close follow-up regarding management of her poorly controlled DM. 2. Dr. Carlena BjornstadJoseph Coladanato, Nephrology  Home Health: None Equipment/Devices: None    Discharge Condition: Improved and stable.  CODE STATUS: Full  Diet recommendation: Heart healthy and diabetic diet.  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 Admitted to stepdown unit for DKA (initial labs: Glucose 9022, bicarbonate <7, anion gap 27, creatinine 2.06, potassium 7.1/hemolyzed, VBG pH 7.017. Improved.   Assessment & Plan:   1. DKA in poorly controlled type II DM/IDDM: Hemoglobin A1c 08/12/16:12.5. Over the last year, her A1c has never been less than 10.3. Presented with labs as above. Admitted to stepdown unit and treated per DKA protocol with aggressive IV fluids, insulin drip, frequent BMP monitoring. DKA resolved and patient was initially transitioned to Lantus 40 units daily and SSI on 1/30. Patient  states that she takes 70/30 insulin at 35 units twice daily but is not fully compliant for no particular reason. On 1/31, discontinued Lantus and resumed 70/30 insulin at home dose. However with her home dose and eating well, she had couple of episodes of hypoglycemia in the hospital. This was suspicious for her not truly being fully compliant with her stated home dose. Her 70/30 insulin was reduced to 15 units twice a day with good inpatient control and no further hypoglycemic episodes. She has been counseled extensively by me, her nurses and the diabetes coordinator regarding all aspects of diabetes care and she verbalizes understanding. She is advised to closely follow up with her PCP regarding further close monitoring and management of her very poorly controlled diabetes. 2. Nausea, vomiting and abdominal pain: DD: Secondary to DKA versus acute GE. Resolved. 3. Acute on stage III chronic kidney disease/ S/P renal transplant: Baseline creatinine is not entirely clear but may be in the 1.3-1.4 range. Presented with creatinine of 2.06. Secondary to DKA and dehydration. Continue immunosuppressants (prednisone, CellCept and Prograf). Outpatient follow-up with nephrology. Creatinine normalized to 1.18. 4. Hyperkalemia: Secondary to DKA and acute kidney injury. Resolved. 5. Hypomagnesemia: Replaced. 6. Dehydration: Secondary to DKA. Improved. Resolved after IV fluids.  7. Hypothyroid: Synthroid 8. Essential hypertension: Uncontrolled. Continue home dose of carvedilol and amlodipine. Close outpatient follow-up and adjustment of medications as deemed necessary.  9. GERD: Has been on H2 blockers for a long time likely due to chronic steroid use and gastro-protection. Continue same. 10. Anemia/pancytopenia: Hemoglobin at discharge normal. Mild leukopenia and thrombocytopenia which are stable. Outpatient follow-up with repeat CBCs. 11. Hyponatremia: Secondary to dehydration and hyperglycemia.  Resolved. 12. Chronic RBBB & prolonged QTC: Repeat QTC 492 ms on 1/30.  Consultants:   None  Procedures:   None   Discharge Instructions  Discharge Instructions    Call MD for:  difficulty breathing, headache or visual disturbances    Complete by:  As directed    Call MD for:  extreme fatigue    Complete by:  As directed    Call MD for:  persistant dizziness or light-headedness    Complete by:  As directed    Call MD for:  persistant nausea and vomiting    Complete by:  As directed    Call MD for:  severe uncontrolled pain    Complete by:  As directed    Diet - low sodium heart healthy    Complete by:  As directed    Diet Carb Modified    Complete by:  As directed    Increase activity slowly    Complete by:  As directed        Medication List    TAKE these medications   acetaminophen 325 MG tablet Commonly known as:  TYLENOL Take 2 tablets (650 mg total) by mouth every 6 (six) hours as needed for mild pain, moderate pain, fever or headache.   amLODipine 10 MG tablet Commonly known as:  NORVASC Take 1 tablet (10 mg total) by mouth daily.   aspirin EC 81 MG tablet Take 81 mg by mouth daily.   carvedilol 6.25 MG tablet Commonly known as:  COREG Take 2 tablets (12.5 mg total) by mouth 2 (two) times daily.   fluticasone 50 MCG/ACT nasal spray Commonly known as:  FLONASE Place 1 spray into both nostrils daily.   insulin NPH-regular Human (70-30) 100 UNIT/ML injection Commonly known as:  NOVOLIN 70/30 Inject 15 Units into the skin 2 (two) times daily with a meal. What changed:  how much to take  when to take this   levothyroxine 137 MCG tablet Commonly known as:  SYNTHROID, LEVOTHROID Take 1 tablet (137 mcg total) by mouth daily. BRAND NAME ONLY   loratadine 10 MG tablet Commonly known as:  CLARITIN Take 1 tablet (10 mg total) by mouth daily.   mycophenolate 250 MG capsule Commonly known as:  CELLCEPT Take 500 mg by mouth 2 (two) times daily.    predniSONE 5 MG tablet Commonly known as:  DELTASONE Take 5 mg by mouth at bedtime.   ranitidine 150 MG tablet Commonly known as:  ZANTAC Take 150 mg by mouth at bedtime.   SSD 1 % cream Generic drug:  silver sulfADIAZINE Apply 1 application topically daily.   tacrolimus 0.5 MG capsule Commonly known as:  PROGRAF Take 0.5 mg by mouth every evening.   tacrolimus 1 MG capsule Commonly known as:  PROGRAF Take 1 mg by mouth daily.      Follow-up Information    Dois Davenport., MD. Schedule an appointment as soon as possible for a visit in 4 day(s).   Specialty:  Family Medicine Why:  To be seen with repeat labs (CBC & BMP). Also follow-up regarding further management of diabetes. Contact information: [address] [place] [address] [phone]        [name], MD. Schedule an appointment as soon as possible for a visit.   Specialty:  Nephrology Contact information: [address] [place] [address] [phone]          Allergies  Allergen Reactions  . Ace Inhibitors Cough  . Adhesive [Tape] Itching and Other (See Comments)    Please use paper tape  .  Lisinopril Cough      Procedures/Studies: Dg Chest Portable 1 View  Result Date: 10/05/2016 CLINICAL DATA:  Shortness of breath today. EXAM: PORTABLE CHEST 1 VIEW COMPARISON:  PA and lateral chest 08/12/2016 and single view of the chest 04/15/2016. FINDINGS: The lungs are clear. Heart size is normal. No pneumothorax or pleural effusion. No acute bony abnormality. IMPRESSION: Negative chest. Electronically Signed   By: Drusilla Kanner M.D.   On: 10/05/2016 10:07      Subjective: Reports that she had a small blister on one of her left toes which ruptured but no other acute symptoms such as pain, redness or drainage. Counseled extensively regarding appropriate foot care and monitoring for any worrisome signs of infection.  Discharge Exam:  Vitals:   10/07/16 2100 10/08/16 0411  10/08/16 1956 10/09/16 0500  BP: (!) 176/91 (!) 160/87 (!) 152/77 (!) 173/95  Pulse: 82 83 86 75  Resp: 18 18 18 18   Temp: 98.5 F (36.9 C) 98 F (36.7 C) 98.2 F (36.8 C) 97.6 F (36.4 C)  TempSrc: Oral Oral Oral Oral  SpO2: 100% 100% 99% 100%  Weight:      Height:        General exam: Pleasant young female, looks older than stated age, moderately built and nourished, sitting up comfortably in bed. Respiratory system: Clear to auscultation. Respiratory effort normal. Cardiovascular system: S1 & S2 heard, RRR. No JVD, murmurs, rubs, gallops or clicks. No pedal edema.  Gastrointestinal system: Abdomen is nondistended, soft and nontender. No organomegaly or masses felt. Normal bowel sounds heard. Central nervous system: Alert and oriented. No focal neurological deficits. Extremities: Symmetric 5 x 5 power. Multiple toe amputations bilateral feet. Tiny area of superficial skin ulceration over the dorsal aspect of left second and third toes without any acute findings. Skin: No rashes, lesions or ulcers Psychiatry: Judgement and insight appear normal. Mood & affect appropriate.     The results of significant diagnostics from this hospitalization (including imaging, microbiology, ancillary and laboratory) are listed below for reference.     Microbiology: Recent Results (from the past 240 hour(s))  MRSA PCR Screening     Status: Abnormal   Collection Time: 10/05/16  1:10 PM  Result Value Ref Range Status   MRSA by PCR POSITIVE (A) NEGATIVE Final    Comment:        The GeneXpert MRSA Assay (FDA approved for NASAL specimens only), is one component of a comprehensive MRSA colonization surveillance program. It is not intended to diagnose MRSA infection nor to guide or monitor treatment for MRSA infections. RESULT CALLED TO, READ BACK BY AND VERIFIED WITH: MCNABB,B @ 1543 ON [id number] BY POTEAT,S      Labs:  Basic Metabolic Panel:  Recent Labs Lab 10/06/16 0242 10/06/16 0609  10/06/16 1125 10/07/16 0607 10/08/16 0530 10/08/16 1430 10/09/16 0525  NA 136 136 134* 135 140  --   --   K 4.2 4.2 3.8 4.4 4.0  --   --   CL 109 110 111 109 111  --   --   CO2 16* 18* 20* 19* 22  --   --   GLUCOSE 149* 196* 141* 263* 56*  --   --   BUN 28* 29* 26* 23* 20  --   --   CREATININE 1.54* 1.51* 1.38* 1.38* 1.18*  --   --   CALCIUM 9.0 9.2 9.0 9.4 9.4  --   --   MG  --   --   --   --   --  1.4* 2.0   Liver Function Tests:  Recent Labs Lab 10/05/16 0919 10/07/16 0607  AST 12* 13*  ALT 8* 9*  ALKPHOS 147* 106  BILITOT 2.0* 0.4  PROT 7.7 6.1*  ALBUMIN 3.1* 2.3*    Recent Labs Lab 10/05/16 0919  LIPASE 15   CBC:  Recent Labs Lab 10/05/16 1152 10/05/16 1557 10/06/16 0242 10/07/16 0607 10/08/16 0530 10/09/16 0525  WBC 11.2* 11.5* 8.2 3.4* 4.0 3.8*  NEUTROABS 9.4*  --   --   --   --   --   HGB 11.0* 12.4 11.5* 11.4* 11.1* 13.1  HCT 33.3* 38.2 34.8* 34.8* 33.8* 40.1  MCV 88.1 89.7 84.9 85.7 85.8 86.2  PLT 202 195 181 144* 133* 147*   CBG:  Recent Labs Lab 10/08/16 1258 10/08/16 1628 10/08/16 2051 10/09/16 0716 10/09/16 1152  GLUCAP 103* 127* 125* 160* 166*   Urinalysis    Component Value Date/Time   COLORURINE STRAW (A) 10/05/2016 1109   APPEARANCEUR CLEAR 10/05/2016 1109   LABSPEC 1.016 10/05/2016 1109   PHURINE 5.0 10/05/2016 1109   GLUCOSEU >=500 (A) 10/05/2016 1109   HGBUR SMALL (A) 10/05/2016 1109   BILIRUBINUR NEGATIVE 10/05/2016 1109   KETONESUR 80 (A) 10/05/2016 1109   PROTEINUR 100 (A) 10/05/2016 1109   UROBILINOGEN 0.2 03/02/2015 1153   NITRITE NEGATIVE 10/05/2016 1109   LEUKOCYTESUR NEGATIVE 10/05/2016 1109      Time coordinating discharge: Over 30 minutes  SIGNED:  Marcellus Scott, MD, FACP, FHM. [facility] Pager [phone]  If 7PM-7AM, please contact night-coverage [email or web address] Password [email or web address] 10/09/2016, 1:44 PM

## 2016-10-09 NOTE — Progress Notes (Signed)
Inpatient Diabetes Program Recommendations  AACE/ADA: New Consensus Statement on Inpatient Glycemic Control (2015)  Target Ranges:  Prepandial:   less than 140 mg/dL      Peak postprandial:   less than 180 mg/dL (1-2 hours)      Critically ill patients:  140 - 180 mg/dL   Lab Results  Component Value Date   GLUCAP 160 (H) 10/09/2016   HGBA1C 12.5 (H) 08/12/2016  Results for [name], [name] (MRN [id number]) as of 10/09/2016 09:34  Ref. Range 10/08/2016 12:58 10/08/2016 16:28 10/08/2016 20:51 10/09/2016 07:16  Glucose-Capillary Latest Ref Range: 65 - 99 mg/dL 409103 (H) 811127 (H) 914125 (H) 160 (H)    Review of Glycemic Control  Blood sugars look great with 70/30 15 units bid. Pt eating 100%. ? Why uncontrolled at home with 70/30 40 units bid??  Pt states she forgets to take her insulin at times. Discussed asking family (son) to assist with reminder. Still needs to check blood sugars 2-3 times/day.  Will continue to follow.  Thank you. Ailene Ardshonda Bulmaro Feagans, RD, LDN, CDE Inpatient Diabetes Coordinator [phone]

## 2016-10-09 NOTE — Discharge Instructions (Signed)
Diabetic Ketoacidosis °Diabetic ketoacidosis is a life-threatening complication of diabetes. If it is not treated, it can cause severe dehydration and organ damage and can lead to a coma or death. °What are the causes? °This condition develops when there is not enough of the hormone insulin in the body. Insulin helps the body to break down sugar for energy. Without insulin, the body cannot break down sugar, so it breaks down fats instead. This leads to the production of acids that are called ketones. Ketones are poisonous at high levels. °This condition can be triggered by: °· Stress on the body that is brought on by an illness. °· Medicines that raise blood glucose levels. °· Not taking diabetes medicine. °What are the signs or symptoms? °Symptoms of this condition include: °· Fatigue. °· Weight loss. °· Excessive thirst. °· Light-headedness. °· Fruity or sweet-smelling breath. °· Excessive urination. °· Vision changes. °· Confusion or irritability. °· Nausea. °· Vomiting. °· Rapid breathing. °· Abdominal pain. °· Feeling flushed. °How is this diagnosed? °This condition is diagnosed based on a medical history, a physical exam, and blood tests. You may also have a urine test that checks for ketones. °How is this treated? °This condition may be treated with: °· Fluid replacement. This may be done to correct dehydration. °· Insulin injections. These may be given through the skin or through an IV tube. °· Electrolyte replacement. Electrolytes, such as potassium and sodium, may be given in pill form or through an IV tube. °· Antibiotic medicines. These may be prescribed if your condition was caused by an infection. °Follow these instructions at home: °Eating and drinking °· Drink enough fluids to keep your urine clear or pale yellow. °· If you cannot eat, alternate between drinking fluids with sugar (such as juice) and salty fluids (such as broth or bouillon). °· If you can eat, follow your usual diet and drink  sugar-free liquids, such as water. °Other Instructions  °· Take insulin as directed by your health care provider. Do not skip insulin injections. Do not use expired insulin. °· If your blood sugar is over 240 mg/dL, monitor your urine ketones every 4-6 hours. °· If you were prescribed an antibiotic medicine, finish all of it even if you start to feel better. °· Rest and exercise only as directed by your health care provider. °· If you get sick, call your health care provider and begin treatment quickly. Your body often needs extra insulin to fight an illness. °· Check your blood glucose levels regularly. If your blood glucose is high, drink plenty of fluids. This helps to flush out ketones. °Contact a health care provider if: °· Your blood glucose level is too high or too low. °· You have ketones in your urine. °· You have a fever. °· You cannot eat. °· You cannot tolerate fluids. °· You have been vomiting for more than 2 hours. °· You continue to have symptoms of this condition. °· You develop new symptoms. °Get help right away if: °· Your blood glucose levels continue to be high (elevated). °· Your monitor reads “high” even when you are taking insulin. °· You faint. °· You have chest pain. °· You have trouble breathing. °· You have a sudden, severe headache. °· You have sudden weakness in one arm or one leg. °· You have sudden trouble speaking or swallowing. °· You have vomiting or diarrhea that gets worse after 3 hours. °· You feel severely fatigued. °· You have trouble thinking. °· You have abdominal pain. °·   You are severely dehydrated. Symptoms of severe dehydration include: °¨ Extreme thirst. °¨ Dry mouth. °¨ Blue lips. °¨ Cold hands and feet. °¨ Rapid breathing. °This information is not intended to replace advice given to you by your health care provider. Make sure you discuss any questions you have with your health care provider. °Document Released: 08/21/2000 Document Revised: 01/30/2016 Document Reviewed:  08/01/2014 °Elsevier Interactive Patient Education © 2017 [employer] ° ° °Hypoglycemia °Hypoglycemia occurs when the level of sugar (glucose) in the blood is too low. Glucose is a type of sugar that provides the body's main source of energy. Certain hormones (insulin and glucagon) control the level of glucose in the blood. Insulin lowers blood glucose, and glucagon increases blood glucose. Hypoglycemia can result from having too much insulin in the bloodstream, or from not eating enough food that contains glucose. °Hypoglycemia can happen in people who do or do not have diabetes. It can develop quickly, and it can be a medical emergency. °What are the causes? °Hypoglycemia occurs most often in people who have diabetes. If you have diabetes, hypoglycemia may be caused by: °· Diabetes medicine. °· Not eating enough, or not eating often enough. °· Increased physical activity. °· Drinking alcohol, especially when you have not eaten recently. °If you do not have diabetes, hypoglycemia may be caused by: °· A tumor in the pancreas. The pancreas is the organ that makes insulin. °· Not eating enough, or not eating for long periods at a time (fasting). °· Severe infection or illness that affects the liver, heart, or kidneys. °· Certain medicines. °You may also have reactive hypoglycemia. This condition causes hypoglycemia within 4 hours of eating a meal. This may occur after having stomach surgery. Sometimes, the cause of reactive hypoglycemia is not known. °What increases the risk? °Hypoglycemia is more likely to develop in: °· People who have diabetes and take medicines to lower blood glucose. °· People who abuse alcohol. °· People who have a severe illness. °What are the signs or symptoms? °Hypoglycemia may not cause any symptoms. If you have symptoms, they may include: °· Hunger. °· Anxiety. °· Sweating and feeling clammy. °· Confusion. °· Dizziness or feeling light-headed. °· Sleepiness. °· Nausea. °· Increased heart  rate. °· Headache. °· Blurry vision. °· Seizure. °· Nightmares. °· Tingling or numbness around the mouth, lips, or tongue. °· A change in speech. °· Decreased ability to concentrate. °· A change in coordination. °· Restless sleep. °· Tremors or shakes. °· Fainting. °· Irritability. °How is this diagnosed? °Hypoglycemia is diagnosed with a blood test to measure your blood glucose level. This blood test is done while you are having symptoms. Your health care provider may also do a physical exam and review your medical history. °If you do not have diabetes, other tests may be done to find the cause of your hypoglycemia. °How is this treated? °This condition can often be treated by immediately eating or drinking something that contains glucose, such as: °· 3-4 sugar tablets (glucose pills). °· Glucose gel, 15-gram tube. °· Fruit juice, 4 oz (120 mL). °· Regular soda (not diet soda), 4 oz (120 mL). °· Low-fat milk, 4 oz (120 mL). °· Several pieces of hard candy. °· Sugar or honey, 1 Tbsp. °Treating Hypoglycemia If You Have Diabetes  °If you are alert and able to swallow safely, follow the 15:15 rule: °· Take 15 grams of a rapid-acting carbohydrate. Rapid-acting options include: °¨ 1 tube of glucose gel. °¨ 3 glucose pills. °¨ 6-8 pieces   of hard candy. °¨ 4 oz (120 mL) of fruit juice. °¨ 4 oz (120 ml) of regular (not diet) soda. °· Check your blood glucose 15 minutes after you take the carbohydrate. °· If the repeat blood glucose level is still at or below 70 mg/dL (3.9 mmol/L), take 15 grams of a carbohydrate again. °· If your blood glucose level does not increase above 70 mg/dL (3.9 mmol/L) after 3 tries, seek emergency medical care. °· After your blood glucose level returns to normal, eat a meal or a snack within 1 hour. °Treating Severe Hypoglycemia  °Severe hypoglycemia is when your blood glucose level is at or below 54 mg/dL (3 mmol/L). Severe hypoglycemia is an emergency. Do not wait to see if the symptoms will go  away. Get medical help right away. Call your local emergency services (911 in the [place] Do not drive yourself to the hospital. °If you have severe hypoglycemia and you cannot eat or drink, you may need an injection of glucagon. A family member or close friend should learn how to check your blood glucose and how to give you a glucagon injection. Ask your health care provider if you need to have an emergency glucagon injection kit available. °Severe hypoglycemia may need to be treated in a hospital. The treatment may include getting glucose through an IV tube. You may also need treatment for the cause of your hypoglycemia. °Follow these instructions at home: °General instructions °· Avoid any diets that cause you to not eat enough food. Talk with your health care provider before you start any new diet. °· Take over-the-counter and prescription medicines only as told by your health care provider. °· Limit alcohol intake to no more than 1 drink per day for nonpregnant women and 2 drinks per day for men. One drink equals 12 oz of beer, 5 oz of wine, or 1½ oz of hard liquor. °· Keep all follow-up visits as told by your health care provider. This is important. °If You Have Diabetes:  °· Make sure you know the symptoms of hypoglycemia. °· Always have a rapid-acting carbohydrate snack with you to treat low blood sugar. °· Follow your diabetes management plan, as told by your health care provider. Make sure you: °¨ Take your medicines as directed. °¨ Follow your exercise plan. °¨ Follow your meal plan. Eat on time, and do not skip meals. °¨ Check your blood glucose as often as directed. Make sure to check your blood glucose before and after exercise. If you exercise longer or in a different way than usual, check your blood glucose more often. °¨ Follow your sick day plan whenever you cannot eat or drink normally. Make this plan in advance with your health care provider. °· Share your diabetes management plan with people in  your workplace, school, and household. °· Check your urine for ketones when you are ill and as told by your health care provider. °· Carry a medical alert card or wear medical alert jewelry. °If You Have Reactive Hypoglycemia or Low Blood Sugar From Other Causes: °· Monitor your blood glucose as told by your health care provider. °· Follow instructions from your health care provider about eating or drinking restrictions. °Contact a health care provider if: °· You have problems keeping your blood glucose in your target range. °· You have frequent episodes of hypoglycemia. °Get help right away if: °· You continue to have hypoglycemia symptoms after eating or drinking something containing glucose. °· Your blood glucose is at or below   54 mg/dL (3 mmol/L).  You have a seizure.  You faint. These symptoms may represent a serious problem that is an emergency. Do not wait to see if the symptoms will go away. Get medical help right away. Call your local emergency services (911 in the [place] Do not drive yourself to the hospital.  This information is not intended to replace advice given to you by your health care provider. Make sure you discuss any questions you have with your health care provider. Document Released: 08/24/2005 Document Revised: 02/05/2016 Document Reviewed: 09/27/2015 Elsevier Interactive Patient Education  2017 [employer].   Additional discharge instructions:  Follow with [name]., MD in 5-7 days  Please get your medications reviewed and adjusted by your Primary MD.  Please request your Primary MD to go over all Hospital Tests and Procedure/Radiological results at the follow up, please get all Hospital records sent to your Prim MD by signing hospital release before you go home.  If you had Pneumonia of Lung problems at the Hospital: Please get a 2 view Chest X ray done in 6-8 weeks after hospital discharge or sooner if instructed by your Primary MD.  If you have Congestive  Heart Failure: Please call your Cardiologist or Primary MD anytime you have any of the following symptoms:  1) 3 pound weight gain in 24 hours or 5 pounds in 1 week  2) shortness of breath, with or without a dry hacking cough  3) swelling in the hands, feet or stomach  4) if you have to sleep on extra pillows at night in order to breathe  Follow cardiac low salt diet and 1.5 lit/day fluid restriction.  If you have diabetes Accuchecks 4 times/day, Once in AM empty stomach and then before each meal. Log in all results and show them to your primary doctor at your next visit. If any glucose reading is under 80 or above 300 call your primary MD immediately.  If you have Seizure/Convulsions/Epilepsy: Please do not drive, operate heavy machinery, participate in activities at heights or participate in high speed sports until you have seen by Primary MD or a Neurologist and advised to do so again.  If you had Gastrointestinal Bleeding: Please ask your Primary MD to check a complete blood count within one week of discharge or at your next visit. Your endoscopic/colonoscopic biopsies that are pending at the time of discharge, will also need to followed by your Primary MD.  Get Medicines reviewed and adjusted. Please take all your medications with you for your next visit with your Primary MD  Please request your Primary MD to go over all hospital tests and procedure/radiological results at the follow up, please ask your Primary MD to get all Hospital records sent to his/her office.  If you experience worsening of your admission symptoms, develop shortness of breath, life threatening emergency, suicidal or homicidal thoughts you must seek medical attention immediately by calling 911 or calling your MD immediately  if symptoms less severe.  You must read complete instructions/literature along with all the possible adverse reactions/side effects for all the Medicines you take and that have been  prescribed to you. Take any new Medicines after you have completely understood and accpet all the possible adverse reactions/side effects.   Do not drive or operate heavy machinery when taking Pain medications.   Do not take more than prescribed Pain, Sleep and Anxiety Medications  Special Instructions: If you have smoked or chewed Tobacco  in the last 2 yrs please  stop smoking, stop any regular Alcohol  and or any Recreational drug use. ° °Wear Seat belts while driving. ° °Please note °You were cared for by a hospitalist during your hospital stay. If you have any questions about your discharge medications or the care you received while you were in the hospital after you are discharged, you can call the unit and asked to speak with the hospitalist on call if the hospitalist that took care of you is not available. Once you are discharged, your primary care physician will handle any further medical issues. Please note that NO REFILLS for any discharge medications will be authorized once you are discharged, as it is imperative that you return to your primary care physician (or establish a relationship with a primary care physician if you do not have one) for your aftercare needs so that they can reassess your need for medications and monitor your lab values. ° °You can reach the hospitalist office at phone [phone] or fax [phone] °  °If you do not have a primary care physician, you can call [phone] for a physician referral. ° ° ° ° °

## 2016-10-09 NOTE — Progress Notes (Signed)
Patient given discharge instructions, and verbalized an understanding of all discharge instructions.  Patient agrees with discharge plan, and is being discharged in stable medical condition.  Patient given transportation via wheelchair. 

## 2016-10-12 ENCOUNTER — Emergency Department (HOSPITAL_COMMUNITY)
Admission: EM | Admit: 2016-10-12 | Discharge: 2016-10-12 | Disposition: A | Discharge status: Home / Self Care | Payer: Medicare Other | Attending: Emergency Medicine | Admitting: Emergency Medicine

## 2016-10-12 ENCOUNTER — Encounter (HOSPITAL_COMMUNITY): Payer: Self-pay | Admitting: Emergency Medicine

## 2016-10-12 ENCOUNTER — Emergency Department (HOSPITAL_COMMUNITY): Payer: Medicare Other

## 2016-10-12 DIAGNOSIS — Y999 Unspecified external cause status: Secondary | ICD-10-CM | POA: Insufficient documentation

## 2016-10-12 DIAGNOSIS — E1165 Type 2 diabetes mellitus with hyperglycemia: Secondary | ICD-10-CM | POA: Diagnosis not present

## 2016-10-12 DIAGNOSIS — S8001XA Contusion of right knee, initial encounter: Secondary | ICD-10-CM | POA: Diagnosis not present

## 2016-10-12 DIAGNOSIS — S20211A Contusion of right front wall of thorax, initial encounter: Secondary | ICD-10-CM | POA: Diagnosis not present

## 2016-10-12 DIAGNOSIS — Z87891 Personal history of nicotine dependence: Secondary | ICD-10-CM | POA: Insufficient documentation

## 2016-10-12 DIAGNOSIS — Z7982 Long term (current) use of aspirin: Secondary | ICD-10-CM | POA: Insufficient documentation

## 2016-10-12 DIAGNOSIS — R739 Hyperglycemia, unspecified: Secondary | ICD-10-CM

## 2016-10-12 DIAGNOSIS — E039 Hypothyroidism, unspecified: Secondary | ICD-10-CM | POA: Insufficient documentation

## 2016-10-12 DIAGNOSIS — S299XXA Unspecified injury of thorax, initial encounter: Secondary | ICD-10-CM | POA: Diagnosis present

## 2016-10-12 DIAGNOSIS — W1839XA Other fall on same level, initial encounter: Secondary | ICD-10-CM | POA: Insufficient documentation

## 2016-10-12 DIAGNOSIS — I120 Hypertensive chronic kidney disease with stage 5 chronic kidney disease or end stage renal disease: Secondary | ICD-10-CM | POA: Diagnosis not present

## 2016-10-12 DIAGNOSIS — S5012XA Contusion of left forearm, initial encounter: Secondary | ICD-10-CM | POA: Diagnosis not present

## 2016-10-12 DIAGNOSIS — N186 End stage renal disease: Secondary | ICD-10-CM | POA: Diagnosis not present

## 2016-10-12 DIAGNOSIS — Y929 Unspecified place or not applicable: Secondary | ICD-10-CM | POA: Diagnosis not present

## 2016-10-12 DIAGNOSIS — W19XXXA Unspecified fall, initial encounter: Secondary | ICD-10-CM

## 2016-10-12 DIAGNOSIS — S0083XA Contusion of other part of head, initial encounter: Secondary | ICD-10-CM | POA: Insufficient documentation

## 2016-10-12 DIAGNOSIS — Y939 Activity, unspecified: Secondary | ICD-10-CM | POA: Insufficient documentation

## 2016-10-12 LAB — BASIC METABOLIC PANEL
Anion gap: 7 (ref 5–15)
BUN: 24 mg/dL — AB (ref 6–20)
CALCIUM: 9.4 mg/dL (ref 8.9–10.3)
CHLORIDE: 97 mmol/L — AB (ref 101–111)
CO2: 28 mmol/L (ref 22–32)
CREATININE: 1.3 mg/dL — AB (ref 0.44–1.00)
GFR calc non Af Amer: 47 mL/min — ABNORMAL LOW (ref >60)
GFR, EST AFRICAN AMERICAN: 55 mL/min — AB (ref >60)
Glucose, Bld: 363 mg/dL — ABNORMAL HIGH (ref 65–99)
Potassium: 4.4 mmol/L (ref 3.5–5.1)
SODIUM: 132 mmol/L — AB (ref 135–145)

## 2016-10-12 LAB — CBC
HCT: 35.4 % — ABNORMAL LOW (ref 36.0–46.0)
Hemoglobin: 11.4 g/dL — ABNORMAL LOW (ref 12.0–15.0)
MCH: 27.8 pg (ref 26.0–34.0)
MCHC: 32.2 g/dL (ref 30.0–36.0)
MCV: 86.3 fL (ref 78.0–100.0)
Platelets: 177 10*3/uL (ref 150–400)
RBC: 4.1 MIL/uL (ref 3.87–5.11)
RDW: 14.1 % (ref 11.5–15.5)
WBC: 4.7 10*3/uL (ref 4.0–10.5)

## 2016-10-12 LAB — URINALYSIS, ROUTINE W REFLEX MICROSCOPIC
Bacteria, UA: NONE SEEN
Bilirubin Urine: NEGATIVE
KETONES UR: 5 mg/dL — AB
Leukocytes, UA: NEGATIVE
Nitrite: NEGATIVE
PH: 6 (ref 5.0–8.0)
Protein, ur: 100 mg/dL — AB
SPECIFIC GRAVITY, URINE: 1.023 (ref 1.005–1.030)

## 2016-10-12 LAB — CBG MONITORING, ED
GLUCOSE-CAPILLARY: 249 mg/dL — AB (ref 65–99)
Glucose-Capillary: 387 mg/dL — ABNORMAL HIGH (ref 65–99)

## 2016-10-12 MED ORDER — ACETAMINOPHEN 500 MG PO TABS
1000.0000 mg | ORAL_TABLET | Freq: Once | ORAL | Status: AC
  Administered 2016-10-12: 1000 mg via ORAL
  Filled 2016-10-12: qty 2

## 2016-10-12 NOTE — ED Notes (Signed)
Rn at bedside collecting labs

## 2016-10-12 NOTE — ED Provider Notes (Signed)
[facility]-EMERGENCY DEPT Provider Note   CSN: [id number] Arrival date & time: 10/12/16  4098     History   Chief Complaint Chief Complaint  Patient presents with  . Fall  . Hyperglycemia    HPI [name] is a 51 y.o. female.  The history is provided by the patient.  Fall  This is a new problem. The current episode started 2 days ago. The problem occurs constantly. The problem has not changed since onset.Associated symptoms include chest pain (right lower lateral). Associated symptoms comments: Right knee pain, left forearm pain, chin pain. The symptoms are aggravated by walking. Nothing relieves the symptoms. She has tried acetaminophen for the symptoms.    Past Medical History:  Diagnosis Date  . Arthritis    "elbows, knees, legs, back" (09/24/2015)  . CKD (chronic kidney disease)   . Daily headache   . Depression    "years ago"  . End stage renal disease (HCC)    right arm AV graft, post transplant  . Hypertension   . Hypothyroid   . 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 Kidney disease   . Pneumonia ~ 2007?  [name] Type II diabetes mellitus (HCC)    Insulin dependant    Patient Active Problem List   Diagnosis Date Noted  . DKA (diabetic ketoacidoses) (HCC) 08/12/2016  . Diabetic acidosis without coma (HCC)   . DKA, type 2 (HCC) 07/21/2016  . Hypoglycemia   . Wound cellulitis   . CKD (chronic kidney disease) stage 3, GFR 30-59 ml/min 06/26/2016  . Cellulitis 06/26/2016  . Cellulitis of right lower extremity   . Hyperglycemia   . QT prolongation 05/13/2016  . UTI (lower urinary tract infection)   . Hypomagnesemia   . Pressure ulcer 04/11/2016  . Acute encephalopathy 04/11/2016  . Altered mental state 04/11/2016  . Altered mental status 04/10/2016  . Insulin dependent diabetes mellitus (HCC) 04/10/2016  . Diabetic ketoacidosis without coma associated with type 2 diabetes mellitus (HCC) 11/02/2015  . Hyperglycemia without ketosis  09/24/2015  . CKD (chronic kidney disease) stage 2, GFR 60-89 ml/min 09/24/2015  . History of renal transplant 09/24/2015  . Uncontrolled type 1 diabetes mellitus with foot ulcer (HCC) 06/04/2015  . History of renal transplantation 06/04/2015  . Foot ulcer, right (HCC)   . Foot abscess, right   . Diarrhea 06/02/2015  . DM (diabetes mellitus) type 2, uncontrolled, with ketoacidosis (HCC) 06/02/2015  . Type 1 diabetes mellitus with diabetic foot ulcer (HCC) 06/02/2015  . Acute renal failure superimposed on stage 3 chronic kidney disease (HCC) 06/02/2015  . Diabetic foot infection (HCC) 06/01/2015  . Essential hypertension 06/01/2015  . GERD (gastroesophageal reflux disease) 06/01/2015  . Hypotension 03/22/2014  . End stage renal disease (HCC) 10/24/2012  . H/O kidney transplant 08/04/2012  . High anion gap metabolic acidosis 08/04/2012  . Sepsis (HCC) 08/04/2012  . DKA, type 1 (HCC) 09/22/2011  . Hyperkalemia 09/22/2011  . Hyponatremia 09/22/2011  . Immunosuppression (HCC)   . Hypothyroidism     Past Surgical History:  Procedure Laterality Date  . AMPUTATION Left 05/11/2014   Procedure: AMPUTATION LEFT GREAT TOE;  Surgeon: Kathryne Hitch, MD;  Location: [facility];  Service: Orthopedics;  Laterality: Left;  . AV FISTULA PLACEMENT Right    forearm  . BACK SURGERY    . CATARACT EXTRACTION W/ INTRAOCULAR LENS  IMPLANT, BILATERAL Bilateral   . CESAREAN SECTION  07/1999  . DG AV DIALYSIS GRAFT DECLOT OR    .  INCISION AND DRAINAGE Right 06/02/2015   Procedure: INCISION AND DRAINAGE OF RIGHT 3rd RAY RESECTION;  Surgeon: Kathryne Hitchhristopher Y Blackman, MD;  Location: [facility];  Service: Orthopedics;  Laterality: Right;  . 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 LUMBAR DISC SURGERY  2001  . NEPHRECTOMY TRANSPLANTED ORGAN    . TOE AMPUTATION Right    1,2 & 3rd toes.  . TUBAL LIGATION  07/1999    OB History    No data available       Home Medications    Prior to Admission medications    Medication Sig Start Date End Date Taking? Authorizing Provider  acetaminophen (TYLENOL) 325 MG tablet Take 2 tablets (650 mg total) by mouth every 6 (six) hours as needed for mild pain, moderate pain, fever or headache. 05/15/16   Elease EtienneAnand D Hongalgi, MD  amLODipine (NORVASC) 10 MG tablet Take 1 tablet (10 mg total) by mouth daily. 08/14/16   Belkys A Regalado, MD  aspirin EC 81 MG tablet Take 81 mg by mouth daily.    Historical Provider, MD  carvedilol (COREG) 6.25 MG tablet Take 2 tablets (12.5 mg total) by mouth 2 (two) times daily. 10/09/16   Elease EtienneAnand D Hongalgi, MD  fluticasone (FLONASE) 50 MCG/ACT nasal spray Place 1 spray into both nostrils daily. 08/14/16   Belkys A Regalado, MD  insulin NPH-regular Human (NOVOLIN 70/30) (70-30) 100 UNIT/ML injection Inject 15 Units into the skin 2 (two) times daily with a meal. 10/09/16   Elease EtienneAnand D Hongalgi, MD  levothyroxine (SYNTHROID, LEVOTHROID) 137 MCG tablet Take 1 tablet (137 mcg total) by mouth daily. BRAND NAME ONLY 03/04/15   Alison MurrayAlma M Devine, MD  loratadine (CLARITIN) 10 MG tablet Take 1 tablet (10 mg total) by mouth daily. 08/14/16   Belkys A Regalado, MD  mycophenolate (CELLCEPT) 250 MG capsule Take 500 mg by mouth 2 (two) times daily.     Historical Provider, MD  predniSONE (DELTASONE) 5 MG tablet Take 5 mg by mouth at bedtime.  05/29/15   Historical Provider, MD  ranitidine (ZANTAC) 150 MG tablet Take 150 mg by mouth at bedtime.  03/27/16   Historical Provider, MD  SSD 1 % cream Apply 1 application topically daily.  06/10/16   Historical Provider, MD  tacrolimus (PROGRAF) 0.5 MG capsule Take 0.5 mg by mouth every evening.     Historical Provider, MD  tacrolimus (PROGRAF) 1 MG capsule Take 1 mg by mouth daily.    Historical Provider, MD    Family History Family History  Problem Relation Age of Onset  . Emphysema Mother   . Throat cancer Mother   . COPD Mother   . Cancer Mother   . Emphysema Father   . COPD Father   . Stroke Father   . ADD / ADHD Son      Social History Social History  Substance Use Topics  . Smoking status: Former Smoker    Packs/day: 1.00    Years: 11.00    Types: Cigarettes    Quit date: 09/21/1998  . Smokeless tobacco: Never Used  . Alcohol use No     Allergies   Ace inhibitors; Adhesive [tape]; and Lisinopril   Review of Systems Review of Systems  Cardiovascular: Positive for chest pain (right lower lateral).  All other systems reviewed and are negative.    Physical Exam Updated Vital Signs BP 136/80   Pulse 78   Temp 97.6 F (36.4 C) (Oral)   Resp 15   Wt 194  lb (88 kg)   LMP 11/19/2011   SpO2 98%   BMI 29.50 kg/m   Physical Exam  Constitutional: She is oriented to person, place, and time. She appears well-developed and well-nourished. No distress.  HENT:  Head: Normocephalic.  Nose: Nose normal.  1 cm hematoma of anterior chin with ecchymosis overlying, mandible is stable without crepitus  Eyes: Conjunctivae are normal.  Neck: Neck supple. No tracheal deviation present.  Cardiovascular: Normal rate, regular rhythm and normal heart sounds.   Pulmonary/Chest: Effort normal and breath sounds normal. No respiratory distress. She exhibits tenderness (right lower lateral without overlying ecchymosis or crepitus).  Abdominal: Soft. She exhibits no distension.  Musculoskeletal:       Right knee: She exhibits no swelling, no effusion, no deformity, no erythema, normal alignment and normal patellar mobility. Tenderness (over patella with overlying contusion and small abrasion) found.       Left forearm: She exhibits tenderness. She exhibits no swelling and no deformity.  Neurological: She is alert and oriented to person, place, and time.  Skin: Skin is warm and dry. Capillary refill takes less than 2 seconds.  Psychiatric: She has a normal mood and affect.  Vitals reviewed.    ED Treatments / Results  Labs (all labs ordered are listed, but only abnormal results are displayed) Labs  Reviewed  BASIC METABOLIC PANEL - Abnormal; Notable for the following:       Result Value   Sodium 132 (*)    Chloride 97 (*)    Glucose, Bld 363 (*)    BUN 24 (*)    Creatinine, Ser 1.30 (*)    GFR calc non Af Amer 47 (*)    GFR calc Af Amer 55 (*)    All other components within normal limits  CBC - Abnormal; Notable for the following:    Hemoglobin 11.4 (*)    HCT 35.4 (*)    All other components within normal limits  URINALYSIS, ROUTINE W REFLEX MICROSCOPIC - Abnormal; Notable for the following:    Glucose, UA >=500 (*)    Hgb urine dipstick SMALL (*)    Ketones, ur 5 (*)    Protein, ur 100 (*)    Squamous Epithelial / LPF 0-5 (*)    All other components within normal limits  CBG MONITORING, ED - Abnormal; Notable for the following:    Glucose-Capillary 387 (*)    All other components within normal limits  CBG MONITORING, ED - Abnormal; Notable for the following:    Glucose-Capillary 249 (*)    All other components within normal limits    EKG  EKG Interpretation None       Radiology Dg Chest 2 View  Result Date: 10/12/2016 CLINICAL DATA:  Status post fall onto the right side going up a ramp 10/10/2016. EXAM: CHEST  2 VIEW COMPARISON:  PA and lateral chest 08/12/2016 and 01/02/2015. FINDINGS: Lungs are clear. Heart size is normal. No pneumothorax or pleural effusion. No focal bony abnormality. IMPRESSION: No acute disease. Electronically Signed   By: Drusilla Kanner M.D.   On: 10/12/2016 10:43   Dg Forearm Left  Result Date: 10/12/2016 CLINICAL DATA:  Larey Seat 2 days ago with pain in the right forearm. EXAM: LEFT FOREARM - 2 VIEW COMPARISON:  None. FINDINGS: There is no evidence of fracture or other focal bone lesions. Question the presence of an elbow effusion. Does the patient have a elbow joint pain? If so, elbow films would be suggested. Regional arterial calcification incidentally  noted. IMPRESSION: No fracture seen. Question elbow joint effusion. If there is pain in the  elbow a region, elbow films would be suggested. Electronically Signed   By: Paulina Fusi M.D.   On: 10/12/2016 10:43   Dg Knee Complete 4 Views Right  Result Date: 10/12/2016 CLINICAL DATA:  Larey Seat 2 days ago.  Knee pain. EXAM: RIGHT KNEE - COMPLETE 4+ VIEW COMPARISON:  06/27/2016 FINDINGS: Extensive regional arterial calcification. No evidence of fracture, dislocation, degenerative change or joint effusion. IMPRESSION: No acute finding.  Arterial calcification. Electronically Signed   By: Paulina Fusi M.D.   On: 10/12/2016 10:46    Procedures Procedures (including critical care time)  Medications Ordered in ED Medications  acetaminophen (TYLENOL) tablet 1,000 mg (1,000 mg Oral Given 10/12/16 0957)     Initial Impression / Assessment and Plan / ED Course  I have reviewed the triage vital signs and the nursing notes.  Pertinent labs & imaging results that were available during my care of the patient were reviewed by me and considered in my medical decision making (see chart for details).     51 y.o. female presents with fall from standing 2 days ago onto a ramp she was walking up with several sore areas and bruising over right knee and chin. No jaw instability or evidence of fracture clinically with small hematoma of chin and layering bruising. Plain films negative for acute fracture or injury complication. Not anticoagulated. Recent admission for DKA with hyperglycemia here but took home insulin which appears to be correcting appropriately. Pt given instructions for supportive care including rest, ice, compression, and elevation to help alleviate symptoms.  Plan to follow up with PCP as needed and return precautions discussed for worsening or new concerning symptoms.   Final Clinical Impressions(s) / ED Diagnoses   Final diagnoses:  Chin contusion, initial encounter  Contusion of right knee, initial encounter  Contusion of left forearm, initial encounter  Chest wall contusion, right,  initial encounter  Fall from standing, initial encounter  Hyperglycemia without ketosis    New Prescriptions New Prescriptions   No medications on file     Lyndal Pulley, MD 10/12/16 1944

## 2016-10-12 NOTE — ED Triage Notes (Signed)
Pt verbalizes fall on Saturday resulting in right knee pain, left forearm pain, and right ribcage pain, and chin pain post fall. Pt also complaint of hyperglycemia; recent admission for such.

## 2016-10-13 NOTE — Progress Notes (Signed)
This encounter was created in error - please disregard.

## 2016-10-13 NOTE — Progress Notes (Unsigned)
This encounter was created in error - please disregard.

## 2016-12-24 IMAGING — CR DG FOOT COMPLETE 3+V*R*
3 series · 3 of 3 positions shown · non-contrast
Comparison: 07/15/2010

CLINICAL DATA: Pressure sore on bottom of great toe stump

EXAM:
RIGHT FOOT COMPLETE - 3+ VIEW

[x foot ap right]
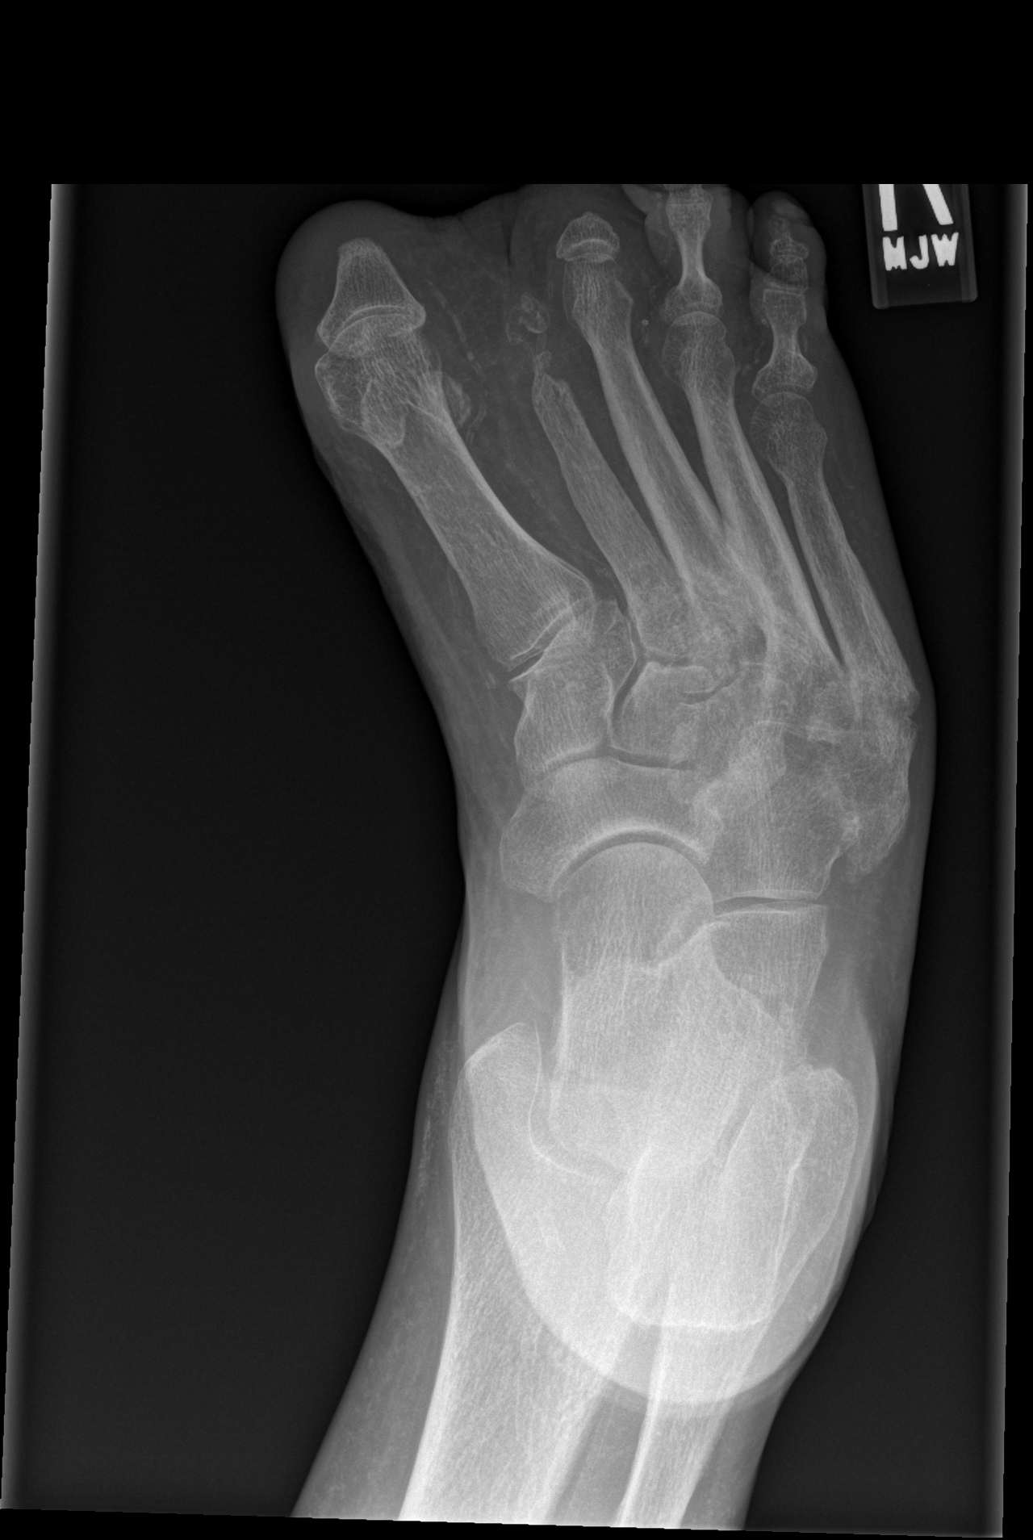

[x foot obl right]
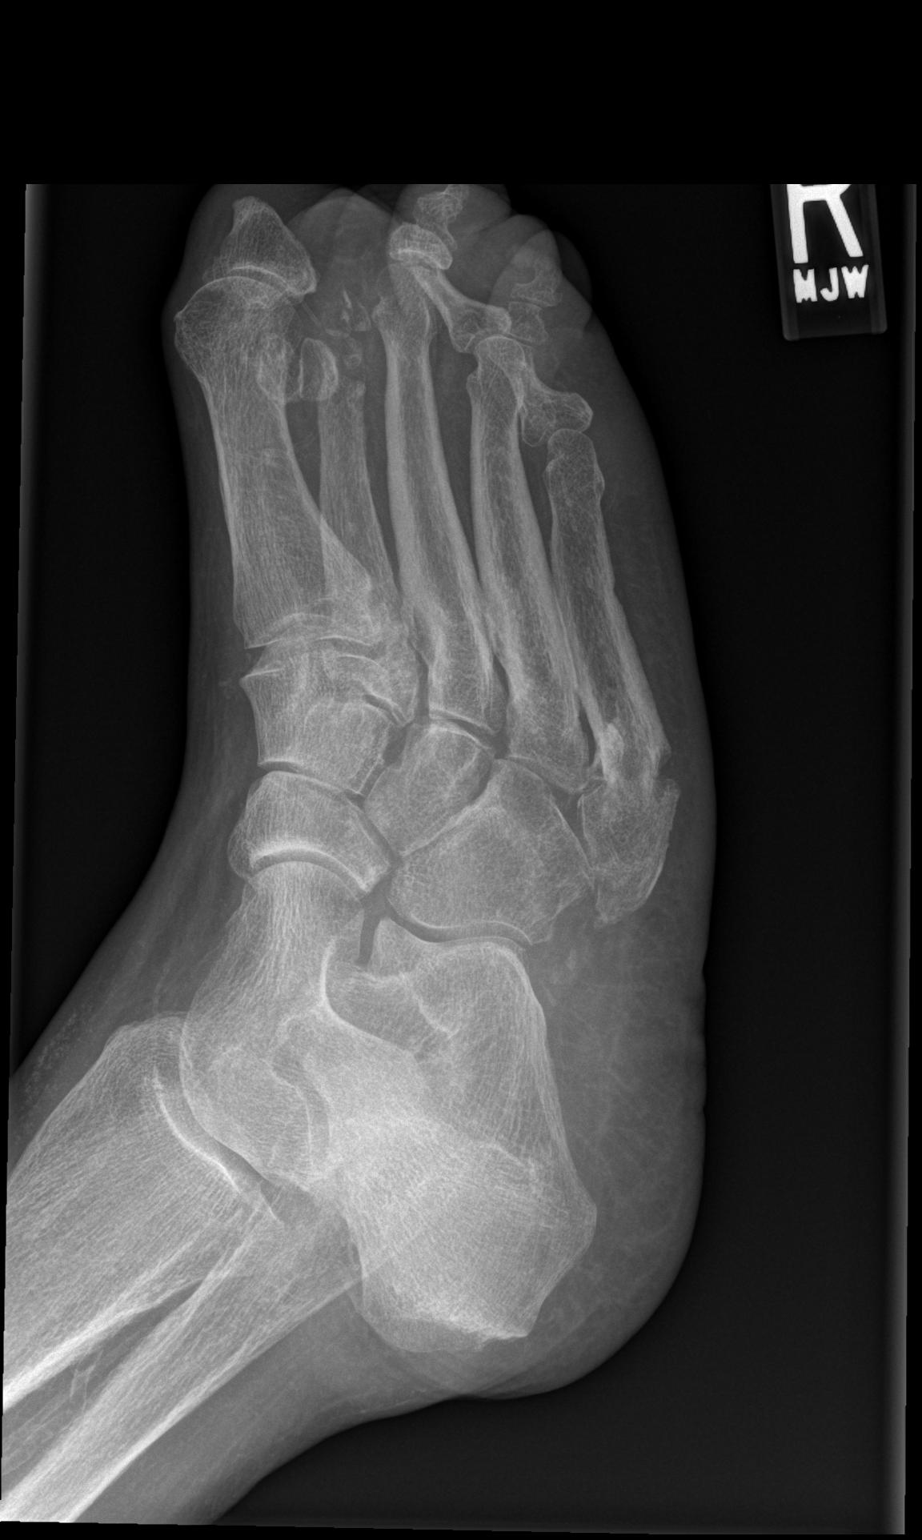

[x foot lat right]
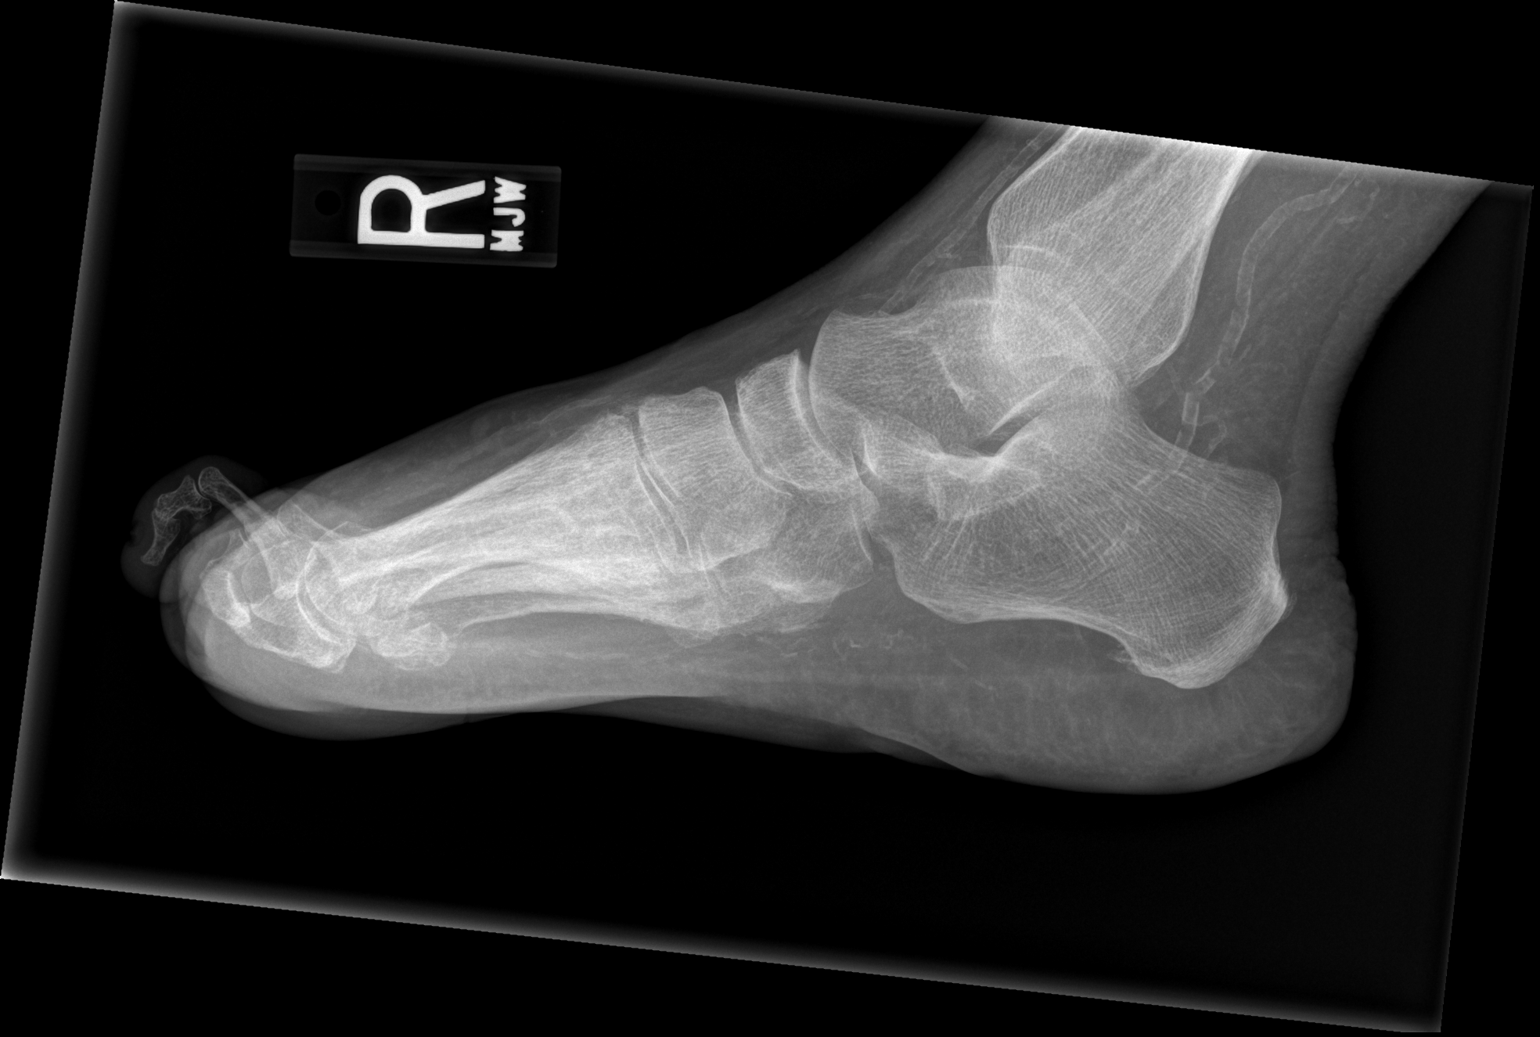

[3 of 3 positions shown; findings below may reference images not displayed]

FINDINGS: Prior amputation of the first and third toes at the level of the
proximal phalanx and second toe at the level of the distal
metatarsal. No radiographic changes of osteomyelitis. Old angulated
fifth metatarsal fracture with partial nonunion laterally. No acute
fracture, subluxation or dislocation. Vascular calcifications noted.
IMPRESSION: No acute bony abnormality. No radiographic changes of osteomyelitis.

## 2016-12-31 ENCOUNTER — Emergency Department (HOSPITAL_COMMUNITY)
Admission: EM | Admit: 2016-12-31 | Discharge: 2016-12-31 | Disposition: A | Discharge status: Home / Self Care | Payer: Medicare Other | Attending: Emergency Medicine | Admitting: Emergency Medicine

## 2016-12-31 ENCOUNTER — Encounter (HOSPITAL_COMMUNITY): Payer: Self-pay | Admitting: Emergency Medicine

## 2016-12-31 DIAGNOSIS — I120 Hypertensive chronic kidney disease with stage 5 chronic kidney disease or end stage renal disease: Secondary | ICD-10-CM | POA: Diagnosis not present

## 2016-12-31 DIAGNOSIS — N186 End stage renal disease: Secondary | ICD-10-CM | POA: Diagnosis not present

## 2016-12-31 DIAGNOSIS — Z794 Long term (current) use of insulin: Secondary | ICD-10-CM | POA: Diagnosis not present

## 2016-12-31 DIAGNOSIS — Z87891 Personal history of nicotine dependence: Secondary | ICD-10-CM | POA: Insufficient documentation

## 2016-12-31 DIAGNOSIS — Z79899 Other long term (current) drug therapy: Secondary | ICD-10-CM | POA: Diagnosis not present

## 2016-12-31 DIAGNOSIS — E1165 Type 2 diabetes mellitus with hyperglycemia: Secondary | ICD-10-CM | POA: Diagnosis not present

## 2016-12-31 DIAGNOSIS — R739 Hyperglycemia, unspecified: Secondary | ICD-10-CM

## 2016-12-31 DIAGNOSIS — Z992 Dependence on renal dialysis: Secondary | ICD-10-CM | POA: Insufficient documentation

## 2016-12-31 DIAGNOSIS — Z7982 Long term (current) use of aspirin: Secondary | ICD-10-CM | POA: Insufficient documentation

## 2016-12-31 DIAGNOSIS — N183 Chronic kidney disease, stage 3 unspecified: Secondary | ICD-10-CM

## 2016-12-31 DIAGNOSIS — E039 Hypothyroidism, unspecified: Secondary | ICD-10-CM | POA: Diagnosis not present

## 2016-12-31 LAB — CBC
HEMATOCRIT: 39.5 % (ref 36.0–46.0)
HEMOGLOBIN: 13 g/dL (ref 12.0–15.0)
MCH: 29.5 pg (ref 26.0–34.0)
MCHC: 32.9 g/dL (ref 30.0–36.0)
MCV: 89.8 fL (ref 78.0–100.0)
Platelets: 187 10*3/uL (ref 150–400)
RBC: 4.4 MIL/uL (ref 3.87–5.11)
RDW: 12.8 % (ref 11.5–15.5)
WBC: 7.5 10*3/uL (ref 4.0–10.5)

## 2016-12-31 LAB — URINALYSIS, ROUTINE W REFLEX MICROSCOPIC
BACTERIA UA: NONE SEEN
Bilirubin Urine: NEGATIVE
Ketones, ur: 20 mg/dL — AB
Leukocytes, UA: NEGATIVE
Nitrite: NEGATIVE
PH: 5 (ref 5.0–8.0)
Protein, ur: 100 mg/dL — AB
SPECIFIC GRAVITY, URINE: 1.017 (ref 1.005–1.030)
WBC UA: NONE SEEN WBC/hpf (ref 0–5)

## 2016-12-31 LAB — CBG MONITORING, ED
GLUCOSE-CAPILLARY: 356 mg/dL — AB (ref 65–99)
Glucose-Capillary: 504 mg/dL (ref 65–99)
Glucose-Capillary: 557 mg/dL (ref 65–99)
Glucose-Capillary: 465 mg/dL — ABNORMAL HIGH (ref 65–99)

## 2016-12-31 LAB — BASIC METABOLIC PANEL
ANION GAP: 13 (ref 5–15)
BUN: 36 mg/dL — AB (ref 6–20)
CALCIUM: 9.9 mg/dL (ref 8.9–10.3)
CO2: 21 mmol/L — ABNORMAL LOW (ref 22–32)
Chloride: 94 mmol/L — ABNORMAL LOW (ref 101–111)
Creatinine, Ser: 1.58 mg/dL — ABNORMAL HIGH (ref 0.44–1.00)
GFR calc Af Amer: 43 mL/min — ABNORMAL LOW (ref >60)
GFR calc non Af Amer: 37 mL/min — ABNORMAL LOW (ref >60)
GLUCOSE: 605 mg/dL — AB (ref 65–99)
POTASSIUM: 4.9 mmol/L (ref 3.5–5.1)
Sodium: 128 mmol/L — ABNORMAL LOW (ref 135–145)

## 2016-12-31 MED ORDER — INSULIN ASPART 100 UNIT/ML ~~LOC~~ SOLN
12.0000 [IU] | Freq: Once | SUBCUTANEOUS | Status: AC
  Administered 2016-12-31: 12 [IU] via SUBCUTANEOUS
  Filled 2016-12-31: qty 1

## 2016-12-31 MED ORDER — SODIUM CHLORIDE 0.9 % IV BOLUS (SEPSIS)
1000.0000 mL | Freq: Once | INTRAVENOUS | Status: AC
  Administered 2016-12-31: 1000 mL via INTRAVENOUS

## 2016-12-31 NOTE — ED Provider Notes (Signed)
[facility]-EMERGENCY DEPT Provider Note   CSN: [id number] Arrival date & time: 12/31/16  0940     History   Chief Complaint Chief Complaint  Patient presents with  . Hyperglycemia    HPI [name] is a 51 y.o. female.  [name] is a 51 y.o. Female with a history of DM, and s/p renal transplant who presents to the ED complaining of hyperglycemia. Patient reports she first noticed her blood sugars were elevated yesterday. She reports her monitor reading over 600 yesterday. She reports she takes insulin Novolin 70/30. She tells me she injects 30 units twice daily. She is not on sliding scale insulin. She reports she's been taking her insulin as prescribed. She reports having associated symptoms of feeling fatigued and dry mouth. She reports one episode of vomiting yesterday morning, and none today. She denies fevers, abdominal pain, diarrhea, chest pain, shortness of breath, coughing, rashes or urinary symptoms.   The history is provided by the patient, the spouse and medical records. No language interpreter was used.  Hyperglycemia  Associated symptoms: fatigue, polyuria and vomiting (resolved )   Associated symptoms: no abdominal pain, no chest pain, no dysuria, no fever, no nausea and no shortness of breath     Past Medical History:  Diagnosis Date  . Arthritis    "elbows, knees, legs, back" (09/24/2015)  . CKD (chronic kidney disease)   . Daily headache   . Depression    "years ago"  . End stage renal disease (HCC)    right arm AV graft, post transplant  . Hypertension   . Hypothyroid   . 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 Kidney disease   . Pneumonia ~ 2007?  [name] Type II diabetes mellitus (HCC)    Insulin dependant    Patient Active Problem List   Diagnosis Date Noted  . DKA (diabetic ketoacidoses) (HCC) 08/12/2016  . Diabetic acidosis without coma (HCC)   . DKA, type 2 (HCC) 07/21/2016  . Hypoglycemia   . Wound cellulitis   . CKD  (chronic kidney disease) stage 3, GFR 30-59 ml/min 06/26/2016  . Cellulitis 06/26/2016  . Cellulitis of right lower extremity   . Hyperglycemia   . QT prolongation 05/13/2016  . UTI (lower urinary tract infection)   . Hypomagnesemia   . Pressure ulcer 04/11/2016  . Acute encephalopathy 04/11/2016  . Altered mental state 04/11/2016  . Altered mental status 04/10/2016  . Insulin dependent diabetes mellitus (HCC) 04/10/2016  . Diabetic ketoacidosis without coma associated with type 2 diabetes mellitus (HCC) 11/02/2015  . Hyperglycemia without ketosis 09/24/2015  . CKD (chronic kidney disease) stage 2, GFR 60-89 ml/min 09/24/2015  . History of renal transplant 09/24/2015  . Uncontrolled type 1 diabetes mellitus with foot ulcer (HCC) 06/04/2015  . History of renal transplantation 06/04/2015  . Foot ulcer, right (HCC)   . Foot abscess, right   . Diarrhea 06/02/2015  . DM (diabetes mellitus) type 2, uncontrolled, with ketoacidosis (HCC) 06/02/2015  . Type 1 diabetes mellitus with diabetic foot ulcer (HCC) 06/02/2015  . Acute renal failure superimposed on stage 3 chronic kidney disease (HCC) 06/02/2015  . Diabetic foot infection (HCC) 06/01/2015  . Essential hypertension 06/01/2015  . GERD (gastroesophageal reflux disease) 06/01/2015  . Hypotension 03/22/2014  . End stage renal disease (HCC) 10/24/2012  . H/O kidney transplant 08/04/2012  . High anion gap metabolic acidosis 08/04/2012  . Sepsis (HCC) 08/04/2012  . DKA, type 1 (HCC) 09/22/2011  .  Hyperkalemia 09/22/2011  . Hyponatremia 09/22/2011  . Immunosuppression (HCC)   . Hypothyroidism     Past Surgical History:  Procedure Laterality Date  . AMPUTATION Left 05/11/2014   Procedure: AMPUTATION LEFT GREAT TOE;  Surgeon: Kathryne Hitch, MD;  Location: [facility];  Service: Orthopedics;  Laterality: Left;  . AV FISTULA PLACEMENT Right    forearm  . BACK SURGERY    . CATARACT EXTRACTION W/ INTRAOCULAR LENS  IMPLANT, BILATERAL  Bilateral   . CESAREAN SECTION  07/1999  . DG AV DIALYSIS GRAFT DECLOT OR    . INCISION AND DRAINAGE Right 06/02/2015   Procedure: INCISION AND DRAINAGE OF RIGHT 3rd RAY RESECTION;  Surgeon: Kathryne Hitch, MD;  Location: [facility];  Service: Orthopedics;  Laterality: Right;  . 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 LUMBAR DISC SURGERY  2001  . NEPHRECTOMY TRANSPLANTED ORGAN    . TOE AMPUTATION Right    1,2 & 3rd toes.  . TUBAL LIGATION  07/1999    OB History    No data available       Home Medications    Prior to Admission medications   Medication Sig Start Date End Date Taking? Authorizing Provider  amLODipine (NORVASC) 10 MG tablet Take 1 tablet (10 mg total) by mouth daily. 08/14/16  Yes Belkys A Regalado, MD  aspirin EC 81 MG tablet Take 81 mg by mouth daily as needed for mild pain.    Yes Historical Provider, MD  carvedilol (COREG) 6.25 MG tablet Take 2 tablets (12.5 mg total) by mouth 2 (two) times daily. 10/09/16  Yes Elease Etienne, MD  insulin NPH-regular Human (NOVOLIN 70/30) (70-30) 100 UNIT/ML injection Inject 15 Units into the skin 2 (two) times daily with a meal. Patient taking differently: Inject 30 Units into the skin 2 (two) times daily with a meal.  10/09/16  Yes Elease Etienne, MD  levothyroxine (SYNTHROID, LEVOTHROID) 137 MCG tablet Take 1 tablet (137 mcg total) by mouth daily. BRAND NAME ONLY 03/04/15  Yes Alison Murray, MD  mycophenolate (CELLCEPT) 250 MG capsule Take 500 mg by mouth 2 (two) times daily.    Yes Historical Provider, MD  predniSONE (DELTASONE) 5 MG tablet Take 5 mg by mouth at bedtime.  05/29/15  Yes Historical Provider, MD  ranitidine (ZANTAC) 150 MG tablet Take 150 mg by mouth at bedtime.  03/27/16  Yes Historical Provider, MD  tacrolimus (PROGRAF) 0.5 MG capsule Take 0.5-1 mg by mouth 2 (two) times daily. Take .  by mouth in the morning and  by mouth at bedtime   Yes Historical Provider, MD  acetaminophen (TYLENOL) 325 MG tablet Take 2  tablets (650 mg total) by mouth every 6 (six) hours as needed for mild pain, moderate pain, fever or headache. 05/15/16   Elease Etienne, MD  fluticasone (FLONASE) 50 MCG/ACT nasal spray Place 1 spray into both nostrils daily. Patient not taking: Reported on 12/31/2016 08/14/16   Belkys A Regalado, MD  loratadine (CLARITIN) 10 MG tablet Take 1 tablet (10 mg total) by mouth daily. Patient not taking: Reported on 12/31/2016 08/14/16   Alba Cory, MD    Family History Family History  Problem Relation Age of Onset  . Emphysema Mother   . Throat cancer Mother   . COPD Mother   . Cancer Mother   . Emphysema Father   . COPD Father   . Stroke Father   . ADD / ADHD Son     Social  History Social History  Substance Use Topics  . Smoking status: Former Smoker    Packs/day: 1.00    Years: 11.00    Types: Cigarettes    Quit date: 09/21/1998  . Smokeless tobacco: Never Used  . Alcohol use No     Allergies   Ace inhibitors; Adhesive [tape]; and Lisinopril   Review of Systems Review of Systems  Constitutional: Positive for fatigue. Negative for chills and fever.  HENT: Negative for congestion and sore throat.   Eyes: Negative for visual disturbance.  Respiratory: Negative for cough and shortness of breath.   Cardiovascular: Negative for chest pain.  Gastrointestinal: Positive for vomiting (resolved ). Negative for abdominal pain, diarrhea and nausea.  Endocrine: Positive for polyuria.  Genitourinary: Positive for frequency. Negative for dysuria.  Musculoskeletal: Negative for back pain and neck pain.  Skin: Negative for rash.  Neurological: Negative for headaches.     Physical Exam Updated Vital Signs BP (!) 150/71   Pulse 71   Temp 97.9 F (36.6 C) (Oral)   Resp 16   LMP 11/19/2011   SpO2 97%   Physical Exam  Constitutional: She appears well-developed and well-nourished. No distress.  Non-toxic appearing   HENT:  Head: Normocephalic and atraumatic.  Mouth/Throat:  Oropharynx is clear and moist.  Mucous membranes slightly dry.  Eyes: Conjunctivae are normal. Pupils are equal, round, and reactive to light. Right eye exhibits no discharge. Left eye exhibits no discharge.  Neck: Neck supple. No JVD present.  Cardiovascular: Normal rate, regular rhythm, normal heart sounds and intact distal pulses.  Exam reveals no gallop and no friction rub.   No murmur heard. Pulmonary/Chest: Effort normal and breath sounds normal. No stridor. No respiratory distress. She has no wheezes. She has no rales.  Abdominal: Soft. There is no tenderness. There is no guarding.  Musculoskeletal: She exhibits no edema or tenderness.  Right first, second and third toes amputated. Left first toe amputated.   Lymphadenopathy:    She has no cervical adenopathy.  Neurological: She is alert. No sensory deficit. Coordination normal.  Skin: Skin is warm and dry. Capillary refill takes less than 2 seconds. No rash noted. She is not diaphoretic. No erythema. No pallor.  Psychiatric: She has a normal mood and affect. Her behavior is normal.  Nursing note and vitals reviewed.    ED Treatments / Results  Labs (all labs ordered are listed, but only abnormal results are displayed) Labs Reviewed  BASIC METABOLIC PANEL - Abnormal; Notable for the following:       Result Value   Sodium 128 (*)    Chloride 94 (*)    CO2 21 (*)    Glucose, Bld 605 (*)    BUN 36 (*)    Creatinine, Ser 1.58 (*)    GFR calc non Af Amer 37 (*)    GFR calc Af Amer 43 (*)    All other components within normal limits  URINALYSIS, ROUTINE W REFLEX MICROSCOPIC - Abnormal; Notable for the following:    Color, Urine STRAW (*)    Glucose, UA >=500 (*)    Hgb urine dipstick SMALL (*)    Ketones, ur 20 (*)    Protein, ur 100 (*)    Squamous Epithelial / LPF 0-5 (*)    All other components within normal limits  CBG MONITORING, ED - Abnormal; Notable for the following:    Glucose-Capillary 504 (*)    All other  components within normal limits  CBG MONITORING, ED -  Abnormal; Notable for the following:    Glucose-Capillary 557 (*)    All other components within normal limits  CBG MONITORING, ED - Abnormal; Notable for the following:    Glucose-Capillary 465 (*)    All other components within normal limits  CBG MONITORING, ED - Abnormal; Notable for the following:    Glucose-Capillary 356 (*)    All other components within normal limits  CBC    EKG  EKG Interpretation None       Radiology No results found.  Procedures Procedures (including critical care time)  Medications Ordered in ED Medications  sodium chloride 0.9 % bolus 1,000 mL (0 mLs Intravenous Stopped 12/31/16 1200)  insulin aspart (novoLOG) injection 12 Units (12 Units Subcutaneous Given 12/31/16 1304)  sodium chloride 0.9 % bolus 1,000 mL (1,000 mLs Intravenous New Bag/Given 12/31/16 1305)  insulin aspart (novoLOG) injection 12 Units (12 Units Subcutaneous Given 12/31/16 1450)     Initial Impression / Assessment and Plan / ED Course  I have reviewed the triage vital signs and the nursing notes.  Pertinent labs & imaging results that were available during my care of the patient were reviewed by me and considered in my medical decision making (see chart for details).    This is a 51 y.o. Female with a history of DM, and s/p renal transplant who presents to the ED complaining of hyperglycemia. Patient reports she first noticed her blood sugars were elevated yesterday. She reports her monitor reading over 600 yesterday. She reports she takes insulin Novolin 70/30. She tells me she injects 30 units twice daily. She is not on sliding scale insulin. She reports she's been taking her insulin as prescribed. She reports having associated symptoms of feeling fatigued and dry mouth. On exam patient is afebrile and nontoxic appearing. Blood sugar is 605 on basic metabolic panel. Normal anion gap of 13. Sodium is 128. Potassium is 4.9.  Patient's creatinine is 1.58. This is around her baseline. CBC shows no leukocytosis. Patient received 2 L fluid bolus and subcutaneous insulin. But her sugar down from 605 to 356. Suspect patient needs further management of her insulin. She tells me she will follow up with her endocrinologist to discuss this. I discussed strict and specific return precautions. I advised the patient to follow-up with their primary care provider this week. I advised the patient to return to the emergency department with new or worsening symptoms or new concerns. The patient verbalized understanding and agreement with plan.    This patient was discussed with Dr. Erma Heritage who agrees with assessment and plan.   Final Clinical Impressions(s) / ED Diagnoses   Final diagnoses:  Hyperglycemia  Stage 3 chronic kidney disease    New Prescriptions New Prescriptions   No medications on file     Everlene Farrier, PA-C 12/31/16 1551    Shaune Pollack, MD 01/01/17 2144

## 2016-12-31 NOTE — ED Triage Notes (Signed)
Pt with Hx DM II, c/o fatigue, low appetite, excessive sleeping, hyperglycemia x few weeks. Immunosuppressed s/p renal transplant.

## 2017-01-01 ENCOUNTER — Encounter (HOSPITAL_COMMUNITY): Payer: Self-pay | Admitting: Emergency Medicine

## 2017-01-01 ENCOUNTER — Observation Stay (HOSPITAL_COMMUNITY)
Admission: EM | Admit: 2017-01-01 | Discharge: 2017-01-02 | Disposition: A | Discharge status: Home / Self Care | Payer: Medicare Other | Attending: Internal Medicine | Admitting: Internal Medicine

## 2017-01-01 ENCOUNTER — Observation Stay (HOSPITAL_COMMUNITY): Payer: Medicare Other

## 2017-01-01 DIAGNOSIS — Z87891 Personal history of nicotine dependence: Secondary | ICD-10-CM | POA: Diagnosis not present

## 2017-01-01 DIAGNOSIS — I129 Hypertensive chronic kidney disease with stage 1 through stage 4 chronic kidney disease, or unspecified chronic kidney disease: Secondary | ICD-10-CM | POA: Diagnosis not present

## 2017-01-01 DIAGNOSIS — Z7982 Long term (current) use of aspirin: Secondary | ICD-10-CM | POA: Insufficient documentation

## 2017-01-01 DIAGNOSIS — D899 Disorder involving the immune mechanism, unspecified: Secondary | ICD-10-CM | POA: Diagnosis not present

## 2017-01-01 DIAGNOSIS — N183 Chronic kidney disease, stage 3 (moderate): Secondary | ICD-10-CM | POA: Insufficient documentation

## 2017-01-01 DIAGNOSIS — Z794 Long term (current) use of insulin: Secondary | ICD-10-CM | POA: Diagnosis not present

## 2017-01-01 DIAGNOSIS — Z79899 Other long term (current) drug therapy: Secondary | ICD-10-CM | POA: Diagnosis not present

## 2017-01-01 DIAGNOSIS — E1165 Type 2 diabetes mellitus with hyperglycemia: Secondary | ICD-10-CM | POA: Diagnosis present

## 2017-01-01 DIAGNOSIS — E039 Hypothyroidism, unspecified: Secondary | ICD-10-CM | POA: Diagnosis not present

## 2017-01-01 DIAGNOSIS — K219 Gastro-esophageal reflux disease without esophagitis: Secondary | ICD-10-CM | POA: Diagnosis not present

## 2017-01-01 DIAGNOSIS — E1122 Type 2 diabetes mellitus with diabetic chronic kidney disease: Secondary | ICD-10-CM | POA: Insufficient documentation

## 2017-01-01 DIAGNOSIS — N182 Chronic kidney disease, stage 2 (mild): Secondary | ICD-10-CM | POA: Diagnosis present

## 2017-01-01 DIAGNOSIS — D849 Immunodeficiency, unspecified: Secondary | ICD-10-CM | POA: Diagnosis present

## 2017-01-01 DIAGNOSIS — I10 Essential (primary) hypertension: Secondary | ICD-10-CM | POA: Diagnosis not present

## 2017-01-01 DIAGNOSIS — Z940 Kidney transplant status: Secondary | ICD-10-CM | POA: Diagnosis not present

## 2017-01-01 DIAGNOSIS — E1110 Type 2 diabetes mellitus with ketoacidosis without coma: Principal | ICD-10-CM | POA: Insufficient documentation

## 2017-01-01 LAB — URINALYSIS, ROUTINE W REFLEX MICROSCOPIC
Bilirubin Urine: NEGATIVE
KETONES UR: 5 mg/dL — AB
Leukocytes, UA: NEGATIVE
NITRITE: NEGATIVE
PROTEIN: 100 mg/dL — AB
Specific Gravity, Urine: 1.017 (ref 1.005–1.030)
pH: 5 (ref 5.0–8.0)

## 2017-01-01 LAB — BASIC METABOLIC PANEL
Anion gap: 16 — ABNORMAL HIGH (ref 5–15)
BUN: 25 mg/dL — AB (ref 6–20)
CALCIUM: 9.8 mg/dL (ref 8.9–10.3)
CO2: 18 mmol/L — AB (ref 22–32)
Chloride: 96 mmol/L — ABNORMAL LOW (ref 101–111)
Creatinine, Ser: 1.48 mg/dL — ABNORMAL HIGH (ref 0.44–1.00)
GFR calc Af Amer: 47 mL/min — ABNORMAL LOW (ref >60)
GFR calc non Af Amer: 40 mL/min — ABNORMAL LOW (ref >60)
GLUCOSE: 586 mg/dL — AB (ref 65–99)
Potassium: 4.5 mmol/L (ref 3.5–5.1)
Sodium: 130 mmol/L — ABNORMAL LOW (ref 135–145)

## 2017-01-01 LAB — CBC
HEMATOCRIT: 35 % — AB (ref 36.0–46.0)
Hemoglobin: 11.8 g/dL — ABNORMAL LOW (ref 12.0–15.0)
MCH: 29.6 pg (ref 26.0–34.0)
MCHC: 33.7 g/dL (ref 30.0–36.0)
MCV: 87.7 fL (ref 78.0–100.0)
Platelets: 171 10*3/uL (ref 150–400)
RBC: 3.99 MIL/uL (ref 3.87–5.11)
RDW: 12.9 % (ref 11.5–15.5)
WBC: 4.1 10*3/uL (ref 4.0–10.5)

## 2017-01-01 LAB — CBG MONITORING, ED
GLUCOSE-CAPILLARY: 509 mg/dL — AB (ref 65–99)
Glucose-Capillary: 305 mg/dL — ABNORMAL HIGH (ref 65–99)
Glucose-Capillary: 381 mg/dL — ABNORMAL HIGH (ref 65–99)
Glucose-Capillary: 556 mg/dL (ref 65–99)

## 2017-01-01 MED ORDER — AMLODIPINE BESYLATE 5 MG PO TABS
10.0000 mg | ORAL_TABLET | Freq: Every day | ORAL | Status: DC
  Administered 2017-01-02: 10 mg via ORAL
  Filled 2017-01-01: qty 2

## 2017-01-01 MED ORDER — DEXTROSE-NACL 5-0.45 % IV SOLN
INTRAVENOUS | Status: DC
  Administered 2017-01-02: 02:00:00 via INTRAVENOUS

## 2017-01-01 MED ORDER — TACROLIMUS 0.5 MG PO CAPS: 0.5000 mg | ORAL_CAPSULE | Freq: Two times a day (BID) | ORAL | Status: DC

## 2017-01-01 MED ORDER — LEVOTHYROXINE SODIUM 137 MCG PO TABS: 137.0000 ug | ORAL_TABLET | Freq: Every day | ORAL | Status: DC

## 2017-01-01 MED ORDER — FAMOTIDINE 20 MG PO TABS
20.0000 mg | ORAL_TABLET | Freq: Every day | ORAL | Status: DC
  Administered 2017-01-02: 20 mg via ORAL
  Filled 2017-01-01: qty 1

## 2017-01-01 MED ORDER — MYCOPHENOLATE MOFETIL 250 MG PO CAPS
500.0000 mg | ORAL_CAPSULE | Freq: Two times a day (BID) | ORAL | Status: DC
  Administered 2017-01-02 (×2): 500 mg via ORAL
  Filled 2017-01-01 (×3): qty 2

## 2017-01-01 MED ORDER — SODIUM CHLORIDE 0.9 % IV SOLN
INTRAVENOUS | Status: DC
  Administered 2017-01-01: via INTRAVENOUS

## 2017-01-01 MED ORDER — SODIUM CHLORIDE 0.9 % IV SOLN
INTRAVENOUS | Status: DC
  Administered 2017-01-01: 4.5 [IU]/h via INTRAVENOUS
  Filled 2017-01-01: qty 2.5

## 2017-01-01 MED ORDER — SODIUM CHLORIDE 0.9 % IV BOLUS (SEPSIS)
1000.0000 mL | Freq: Once | INTRAVENOUS | Status: AC
  Administered 2017-01-01: 1000 mL via INTRAVENOUS

## 2017-01-01 MED ORDER — PREDNISONE 5 MG PO TABS
5.0000 mg | ORAL_TABLET | Freq: Every day | ORAL | Status: DC
  Administered 2017-01-02: 5 mg via ORAL
  Filled 2017-01-01: qty 1

## 2017-01-01 MED ORDER — POTASSIUM CHLORIDE CRYS ER 20 MEQ PO TBCR
20.0000 meq | EXTENDED_RELEASE_TABLET | Freq: Once | ORAL | Status: AC
  Administered 2017-01-02: 20 meq via ORAL
  Filled 2017-01-01: qty 1

## 2017-01-01 MED ORDER — ASPIRIN EC 81 MG PO TBEC: 81.0000 mg | DELAYED_RELEASE_TABLET | Freq: Every day | ORAL | Status: DC | PRN

## 2017-01-01 MED ORDER — DEXTROSE-NACL 5-0.45 % IV SOLN: INTRAVENOUS | Status: DC

## 2017-01-01 MED ORDER — SODIUM CHLORIDE 0.9 % IV SOLN
INTRAVENOUS | Status: DC
  Administered 2017-01-01: 22:00:00 via INTRAVENOUS

## 2017-01-01 MED ORDER — CARVEDILOL 12.5 MG PO TABS
12.5000 mg | ORAL_TABLET | Freq: Two times a day (BID) | ORAL | Status: DC
  Administered 2017-01-02: 12.5 mg via ORAL
  Filled 2017-01-01: qty 1

## 2017-01-01 NOTE — ED Triage Notes (Signed)
Pt reports high blood sugar of 574 at home as well as thirst, states she was at [facility] for the same yesterday but was discharged after sugar came down. Hx of kidney transplant. A/ox4

## 2017-01-01 NOTE — ED Provider Notes (Signed)
[facility]-EMERGENCY DEPT Provider Note   CSN: [id number] Arrival date & time: 01/01/17  1853     History   Chief Complaint Chief Complaint  Patient presents with  . Hyperglycemia    HPI [name] is a 51 y.o. female.  [name] is a 51 y.o. Female with a history of DM, and s/p renal transplant who presents to the ED complaining of hyperglycemia.  Patient reports her sugars have been elevated again today. She first noticed her sugars are elevated about 2 days ago. She reports her blood sugar was reading in the 560s today at home. She was seen in the emergency department yesterday and was discharge after her blood sugars improved after fluids and insulin. She reports she made an appointment yesterday for follow-up with her endocrinologist in June. She tells me she did inject more insulin today, however still has had very high blood sugars. She reports feeling fatigued and increased thirst. She denies fevers, vomiting, abdominal pain, nausea, diarrhea, chest pain, shortness of breath, coughing, urinary symptoms or rashes.   The history is provided by the patient and medical records. No language interpreter was used.  Hyperglycemia  Associated symptoms: fatigue and increased thirst   Associated symptoms: no abdominal pain, no chest pain, no dysuria, no fever, no nausea, no shortness of breath and no vomiting     Past Medical History:  Diagnosis Date  . Arthritis    "elbows, knees, legs, back" (09/24/2015)  . CKD (chronic kidney disease)   . Daily headache   . Depression    "years ago"  . End stage renal disease (HCC)    right arm AV graft, post transplant  . Hypertension   . Hypothyroid   . 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 Kidney disease   . Pneumonia ~ 2007?  [name] Type II diabetes mellitus (HCC)    Insulin dependant    Patient Active Problem List   Diagnosis Date Noted  . DKA (diabetic ketoacidoses) (HCC) 08/12/2016  . Diabetic acidosis  without coma (HCC)   . DKA, type 2 (HCC) 07/21/2016  . Hypoglycemia   . Wound cellulitis   . CKD (chronic kidney disease) stage 3, GFR 30-59 ml/min 06/26/2016  . Cellulitis 06/26/2016  . Cellulitis of right lower extremity   . Hyperglycemia   . QT prolongation 05/13/2016  . UTI (lower urinary tract infection)   . Hypomagnesemia   . Pressure ulcer 04/11/2016  . Acute encephalopathy 04/11/2016  . Altered mental state 04/11/2016  . Altered mental status 04/10/2016  . Insulin dependent diabetes mellitus (HCC) 04/10/2016  . Diabetic ketoacidosis without coma associated with type 2 diabetes mellitus (HCC) 11/02/2015  . Hyperglycemia without ketosis 09/24/2015  . CKD (chronic kidney disease) stage 2, GFR 60-89 ml/min 09/24/2015  . History of renal transplant 09/24/2015  . Uncontrolled type 1 diabetes mellitus with foot ulcer (HCC) 06/04/2015  . History of renal transplantation 06/04/2015  . Foot ulcer, right (HCC)   . Foot abscess, right   . Diarrhea 06/02/2015  . DM (diabetes mellitus) type 2, uncontrolled, with ketoacidosis (HCC) 06/02/2015  . Type 1 diabetes mellitus with diabetic foot ulcer (HCC) 06/02/2015  . Acute renal failure superimposed on stage 3 chronic kidney disease (HCC) 06/02/2015  . Diabetic foot infection (HCC) 06/01/2015  . Essential hypertension 06/01/2015  . GERD (gastroesophageal reflux disease) 06/01/2015  . Hypotension 03/22/2014  . End stage renal disease (HCC) 10/24/2012  . H/O kidney transplant 08/04/2012  . High  anion gap metabolic acidosis 08/04/2012  . Sepsis (HCC) 08/04/2012  . DKA, type 1 (HCC) 09/22/2011  . Hyperkalemia 09/22/2011  . Hyponatremia 09/22/2011  . Immunosuppression (HCC)   . Hypothyroidism     Past Surgical History:  Procedure Laterality Date  . AMPUTATION Left 05/11/2014   Procedure: AMPUTATION LEFT GREAT TOE;  Surgeon: Kathryne Hitch, MD;  Location: [facility];  Service: Orthopedics;  Laterality: Left;  . AV FISTULA PLACEMENT  Right    forearm  . BACK SURGERY    . CATARACT EXTRACTION W/ INTRAOCULAR LENS  IMPLANT, BILATERAL Bilateral   . CESAREAN SECTION  07/1999  . DG AV DIALYSIS GRAFT DECLOT OR    . INCISION AND DRAINAGE Right 06/02/2015   Procedure: INCISION AND DRAINAGE OF RIGHT 3rd RAY RESECTION;  Surgeon: Kathryne Hitch, MD;  Location: [facility] OR;  Service: Orthopedics;  Laterality: Right;  . 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 LUMBAR DISC SURGERY  2001  . NEPHRECTOMY TRANSPLANTED ORGAN    . TOE AMPUTATION Right    1,2 & 3rd toes.  . TUBAL LIGATION  07/1999    OB History    No data available       Home Medications    Prior to Admission medications   Medication Sig Start Date End Date Taking? Authorizing Provider  acetaminophen (TYLENOL) 325 MG tablet Take 2 tablets (650 mg total) by mouth every 6 (six) hours as needed for mild pain, moderate pain, fever or headache. 05/15/16  Yes Elease Etienne, MD  amLODipine (NORVASC) 10 MG tablet Take 1 tablet (10 mg total) by mouth daily. 08/14/16  Yes Belkys A Regalado, MD  aspirin EC 81 MG tablet Take 81 mg by mouth daily as needed for mild pain.    Yes Historical Provider, MD  carvedilol (COREG) 6.25 MG tablet Take 2 tablets (12.5 mg total) by mouth 2 (two) times daily. 10/09/16  Yes Elease Etienne, MD  fluticasone (FLONASE) 50 MCG/ACT nasal spray Place 1 spray into both nostrils daily. 08/14/16  Yes Belkys A Regalado, MD  insulin NPH-regular Human (NOVOLIN 70/30) (70-30) 100 UNIT/ML injection Inject 15 Units into the skin 2 (two) times daily with a meal. Patient taking differently: Inject 30 Units into the skin 2 (two) times daily with a meal.  10/09/16  Yes Elease Etienne, MD  levothyroxine (SYNTHROID, LEVOTHROID) 137 MCG tablet Take 1 tablet (137 mcg total) by mouth daily. BRAND NAME ONLY 03/04/15  Yes Alison Murray, MD  mycophenolate (CELLCEPT) 250 MG capsule Take 500 mg by mouth 2 (two) times daily.    Yes Historical Provider, MD  predniSONE  (DELTASONE) 5 MG tablet Take 5 mg by mouth at bedtime.  05/29/15  Yes Historical Provider, MD  ranitidine (ZANTAC) 150 MG tablet Take 150 mg by mouth at bedtime.  03/27/16  Yes Historical Provider, MD  tacrolimus (PROGRAF) 0.5 MG capsule Take 0.5-1 mg by mouth 2 (two) times daily. Take .  by mouth in the morning and  by mouth at bedtime   Yes Historical Provider, MD  loratadine (CLARITIN) 10 MG tablet Take 1 tablet (10 mg total) by mouth daily. Patient not taking: Reported on 01/01/2017 08/14/16   Alba Cory, MD    Family History Family History  Problem Relation Age of Onset  . Emphysema Mother   . Throat cancer Mother   . COPD Mother   . Cancer Mother   . Emphysema Father   . COPD Father   . Stroke  Father   . ADD / ADHD Son     Social History Social History  Substance Use Topics  . Smoking status: Former Smoker    Packs/day: 1.00    Years: 11.00    Types: Cigarettes    Quit date: 09/21/1998  . Smokeless tobacco: Never Used  . Alcohol use No     Allergies   Ace inhibitors; Adhesive [tape]; and Lisinopril   Review of Systems Review of Systems  Constitutional: Positive for fatigue. Negative for chills and fever.  HENT: Negative for congestion and sore throat.   Eyes: Negative for visual disturbance.  Respiratory: Negative for cough and shortness of breath.   Cardiovascular: Negative for chest pain.  Gastrointestinal: Negative for abdominal pain, diarrhea, nausea and vomiting.  Endocrine: Positive for polydipsia.  Genitourinary: Negative for dysuria.  Musculoskeletal: Negative for back pain and neck pain.  Skin: Negative for rash.  Neurological: Negative for headaches.     Physical Exam Updated Vital Signs BP (!) 151/84 (BP Location: Right Arm)   Pulse 77   Temp 98 F (36.7 C) (Oral)   Resp 18   Ht 5\' 7"  (1.702 m)   Wt 80.3 kg   LMP 11/19/2011   SpO2 94%   BMI 27.72 kg/m   Physical Exam  Constitutional: She is oriented to person, place, and  time. She appears well-developed and well-nourished. No distress.  Nontoxic appearing.  HENT:  Head: Normocephalic and atraumatic.  Mouth/Throat: Oropharynx is clear and moist.  Eyes: Conjunctivae are normal. Pupils are equal, round, and reactive to light. Right eye exhibits no discharge. Left eye exhibits no discharge.  Neck: Normal range of motion. Neck supple. No JVD present. No tracheal deviation present.  Cardiovascular: Normal rate, regular rhythm, normal heart sounds and intact distal pulses.  Exam reveals no gallop and no friction rub.   No murmur heard. Pulmonary/Chest: Effort normal and breath sounds normal. No stridor. No respiratory distress. She has no wheezes. She has no rales.  Abdominal: Soft. There is no tenderness. There is no guarding.  Musculoskeletal: She exhibits no edema.  Lymphadenopathy:    She has no cervical adenopathy.  Neurological: She is alert and oriented to person, place, and time. Coordination normal.  Skin: Skin is warm and dry. Capillary refill takes less than 2 seconds. No rash noted. She is not diaphoretic. No erythema. No pallor.  Psychiatric: She has a normal mood and affect. Her behavior is normal.  Nursing note and vitals reviewed.    ED Treatments / Results  Labs (all labs ordered are listed, but only abnormal results are displayed) Labs Reviewed  BASIC METABOLIC PANEL - Abnormal; Notable for the following:       Result Value   Sodium 130 (*)    Chloride 96 (*)    CO2 18 (*)    Glucose, Bld 586 (*)    BUN 25 (*)    Creatinine, Ser 1.48 (*)    GFR calc non Af Amer 40 (*)    GFR calc Af Amer 47 (*)    Anion gap 16 (*)    All other components within normal limits  CBC - Abnormal; Notable for the following:    Hemoglobin 11.8 (*)    HCT 35.0 (*)    All other components within normal limits  URINALYSIS, ROUTINE W REFLEX MICROSCOPIC - Abnormal; Notable for the following:    Color, Urine STRAW (*)    Glucose, UA >=500 (*)    Hgb urine  dipstick SMALL (*)  Ketones, ur 5 (*)    Protein, ur 100 (*)    Bacteria, UA RARE (*)    Squamous Epithelial / LPF 0-5 (*)    All other components within normal limits  CBG MONITORING, ED - Abnormal; Notable for the following:    Glucose-Capillary 556 (*)    All other components within normal limits  CBG MONITORING, ED - Abnormal; Notable for the following:    Glucose-Capillary 509 (*)    All other components within normal limits  CBG MONITORING, ED - Abnormal; Notable for the following:    Glucose-Capillary 381 (*)    All other components within normal limits  CBG MONITORING, ED - Abnormal; Notable for the following:    Glucose-Capillary 305 (*)    All other components within normal limits  BASIC METABOLIC PANEL  BASIC METABOLIC PANEL  BASIC METABOLIC PANEL  BASIC METABOLIC PANEL  HEPATIC FUNCTION PANEL  BASIC METABOLIC PANEL    EKG  EKG Interpretation None       Radiology Portable Chest X-ray (1 View)  Result Date: 01/01/2017 CLINICAL DATA:  51 year old female with diabetic ketoacidosis. EXAM: PORTABLE CHEST 1 VIEW COMPARISON:  Chest radiograph dated 10/12/2016 FINDINGS: The heart size and mediastinal contours are within normal limits. Both lungs are clear. The visualized skeletal structures are unremarkable. IMPRESSION: No active disease. Electronically Signed   By: Elgie Collard M.D.   On: 01/01/2017 23:57    Procedures Procedures (including critical care time)  CRITICAL CARE Performed by: Lawana Chambers   Total critical care time: 40 minutes  Critical care time was exclusive of separately billable procedures and treating other patients.  Critical care was necessary to treat or prevent imminent or life-threatening deterioration.  Critical care was time spent personally by me on the following activities: development of treatment plan with patient and/or surrogate as well as nursing, discussions with consultants, evaluation of patient's response to  treatment, examination of patient, obtaining history from patient or surrogate, ordering and performing treatments and interventions, ordering and review of laboratory studies, ordering and review of radiographic studies, pulse oximetry and re-evaluation of patient's condition.   Medications Ordered in ED Medications  insulin regular (NOVOLIN R,HUMULIN R) 250 Units in sodium chloride 0.9 % 250 mL (1 Units/mL) infusion (2.5 Units/hr Intravenous Rate/Dose Change 01/02/17 0003)  carvedilol (COREG) tablet 12.5 mg (not administered)  amLODipine (NORVASC) tablet 10 mg (not administered)  tacrolimus (PROGRAF) capsule 0.5-1 mg (not administered)  aspirin EC tablet 81 mg (not administered)  famotidine (PEPCID) tablet 20 mg (not administered)  predniSONE (DELTASONE) tablet 5 mg (not administered)  levothyroxine (SYNTHROID, LEVOTHROID) tablet 137 mcg (not administered)  mycophenolate (CELLCEPT) capsule 500 mg (not administered)  dextrose 5 %-0.45 % sodium chloride infusion (not administered)  0.9 %  sodium chloride infusion ( Intravenous New Bag/Given 01/01/17 2356)  acetaminophen (TYLENOL) tablet 1,000 mg (not administered)  sodium chloride 0.9 % bolus 1,000 mL (0 mLs Intravenous Stopped 01/01/17 2356)  sodium chloride 0.9 % bolus 1,000 mL (0 mLs Intravenous Stopped 01/01/17 2356)  potassium chloride SA (K-DUR,KLOR-CON) CR tablet 20 mEq (20 mEq Oral Given 01/02/17 0015)     Initial Impression / Assessment and Plan / ED Course  I have reviewed the triage vital signs and the nursing notes.  Pertinent labs & imaging results that were available during my care of the patient were reviewed by me and considered in my medical decision making (see chart for details).    This is a 51 y.o. Female with a history  of DM, and s/p renal transplant who presents to the ED complaining of hyperglycemia.  Patient reports her sugars have been elevated again today. She first noticed her sugars are elevated about 2 days ago.  She reports her blood sugar was reading in the 560s today at home. She was seen in the emergency department yesterday and was discharge after her blood sugars improved after fluids and insulin. She reports she made an appointment yesterday for follow-up with her endocrinologist in June. She tells me she did inject more insulin today, however still has had very high blood sugars. She reports feeling fatigued and increased thirst. She denies fevers or recent illness. On exam patient is afebrile nontoxic appearing. BMP reveals a blood glucose of 586. Anion gap is 16. Patient has DKA today.  Will start on glucose stabilizer. Creatinine is 1.48. This is around her baseline. CBC shows no leukocytosis. Urinalysis is without signs of infection. We'll start patient on glucose stabilizer. Plan for admission. Patient agrees with plan for admission. At reevaluation patient reports she is feeling somewhat better. Normal vitals.   I consulted with Dr. Montez Morita who accepted the patient for admission.   This patient was discussed with Dr. Anitra Lauth who agrees with assessment and plan.   Final Clinical Impressions(s) / ED Diagnoses   Final diagnoses:  Diabetic ketoacidosis without coma associated with type 2 diabetes mellitus (HCC)  History of kidney transplant  Immunosuppression [facility])  Essential hypertension  Stage 2 chronic kidney disease    New Prescriptions New Prescriptions   No medications on file     Everlene Farrier, PA-C 01/02/17 0111    Gwyneth Sprout, MD 01/02/17 [phone]

## 2017-01-01 NOTE — H&P (Signed)
History and Physical    [name] ZOX:[id number] DOB: Feb 05, 1966 DOA: 01/01/2017  PCP: Dois Davenport., MD   Patient coming from: Home  Chief Complaint: Polyuria, polydipsia  HPI: [name] is a 51 y.o. woman with a history of insulin dependent diabetes, CKD S/P renal transplant [facility], 2011, on prednisone, cellcept, and prograf), and hypothyroidism who presents to the ED for evaluation of symptoms of hyperglycemia.  Symptoms have been present for two days.  She actually presented to the ED at [facility] yesterday.  Her blood sugar was 605 then, and her bicarb was 21.  She improved with 2L of NS and subQ insulin.  She was discharged with plans to follow-up with her endocrinologist [facility]), but this appointment is in June.  It does not appear that any changes were made to her insulin regimen.  She continue to have urinary frequency, excessive thirst, dry mouth, and fatigue.  She has also had diarrhea, which has happened to her in the past with hyperglycemia.  No fever.  Dull pain in her upper abdomen.  She has had bilious emesis.  No chest pain or shortness of breath.  No dysuria.  No cough.  She has right knee pain after a reported fall.  She has intermittent headaches.  She has chronic weakness in her right hand.  She is on NPH 70/30 30 units twice daily.  The cost of long acting insulin products like lantus and levimir have prohibited use.  She has never been on an insulin pump.  She says that she checks her blood sugars twice daily.  AM sugars are usually near 200.  Bedtime sugars are as high as 300's.  She does not eat three meals most days, and she tends to snack.  She does not use any short acting insulin with meals.  ED Course: Blood sugar 586.  Sodium 130.  Bicarb 18.  BUN 25.  Creatinine 1.48 (slightly up from baseline of about 1.3).  Chest xray negative for acute process.  Urine does not appear infected.  She is S/P 2L NS in the ED.  She has been started on the  regular insulin infusion per protocol with NS at 125cc/hr.  Hospitalist asked to admit.  Review of Systems: As per HPI otherwise 10 systems reviewed and negative.  Past Medical History:  Diagnosis Date  . Arthritis    "elbows, knees, legs, back" (09/24/2015)  . CKD (chronic kidney disease)   . Daily headache   . Depression    "years ago"  . End stage renal disease (HCC)    right arm AV graft, post transplant  . Hypertension   . Hypothyroid   . 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 Kidney disease   . Pneumonia ~ 2007?  [name] Type II diabetes mellitus (HCC)    Insulin dependant    Past Surgical History:  Procedure Laterality Date  . AMPUTATION Left 05/11/2014   Procedure: AMPUTATION LEFT GREAT TOE;  Surgeon: Kathryne Hitch, MD;  Location: [facility] ORS;  Service: Orthopedics;  Laterality: Left;  . AV FISTULA PLACEMENT Right    forearm  . BACK SURGERY    . CATARACT EXTRACTION W/ INTRAOCULAR LENS  IMPLANT, BILATERAL Bilateral   . CESAREAN SECTION  07/1999  . DG AV DIALYSIS GRAFT DECLOT OR    . INCISION AND DRAINAGE Right 06/02/2015   Procedure: INCISION AND DRAINAGE OF RIGHT 3rd RAY RESECTION;  Surgeon: Kathryne Hitch, MD;  Location: [facility] OR;  Service: Orthopedics;  Laterality: Right;  . 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 LUMBAR DISC SURGERY  2001  . NEPHRECTOMY TRANSPLANTED ORGAN    . TOE AMPUTATION Right    1,2 & 3rd toes.  . TUBAL LIGATION  07/1999     reports that she quit smoking about 18 years ago. Her smoking use included Cigarettes. She has a 11.00 pack-year smoking history. She has never used smokeless tobacco. She reports that she does not drink alcohol or use drugs.  Allergies  Allergen Reactions  . Ace Inhibitors Cough  . Adhesive [Tape] Itching and Other (See Comments)    Please use paper tape  . Lisinopril Cough    Family History  Problem Relation Age of Onset  . Emphysema Mother   . Throat cancer Mother   . COPD Mother     . Cancer Mother   . Emphysema Father   . COPD Father   . Stroke Father   . ADD / ADHD Son      Prior to Admission medications   Medication Sig Start Date End Date Taking? Authorizing Provider  acetaminophen (TYLENOL) 325 MG tablet Take 2 tablets (650 mg total) by mouth every 6 (six) hours as needed for mild pain, moderate pain, fever or headache. 05/15/16  Yes Elease Etienne, MD  amLODipine (NORVASC) 10 MG tablet Take 1 tablet (10 mg total) by mouth daily. 08/14/16  Yes Belkys A Regalado, MD  aspirin EC 81 MG tablet Take 81 mg by mouth daily as needed for mild pain.    Yes Historical Provider, MD  carvedilol (COREG) 6.25 MG tablet Take 2 tablets (12.5 mg total) by mouth 2 (two) times daily. 10/09/16  Yes Elease Etienne, MD  fluticasone (FLONASE) 50 MCG/ACT nasal spray Place 1 spray into both nostrils daily. 08/14/16  Yes Belkys A Regalado, MD  insulin NPH-regular Human (NOVOLIN 70/30) (70-30) 100 UNIT/ML injection Inject 15 Units into the skin 2 (two) times daily with a meal. Patient taking differently: Inject 30 Units into the skin 2 (two) times daily with a meal.  10/09/16  Yes Elease Etienne, MD  levothyroxine (SYNTHROID, LEVOTHROID) 137 MCG tablet Take 1 tablet (137 mcg total) by mouth daily. BRAND NAME ONLY 03/04/15  Yes Alison Murray, MD  mycophenolate (CELLCEPT) 250 MG capsule Take 500 mg by mouth 2 (two) times daily.    Yes Historical Provider, MD  predniSONE (DELTASONE) 5 MG tablet Take 5 mg by mouth at bedtime.  05/29/15  Yes Historical Provider, MD  ranitidine (ZANTAC) 150 MG tablet Take 150 mg by mouth at bedtime.  03/27/16  Yes Historical Provider, MD  tacrolimus (PROGRAF) 0.5 MG capsule Take 0.5-1 mg by mouth 2 (two) times daily. Take .  by mouth in the morning and  by mouth at bedtime   Yes Historical Provider, MD  loratadine (CLARITIN) 10 MG tablet Take 1 tablet (10 mg total) by mouth daily. Patient not taking: Reported on 01/01/2017 08/14/16   Alba Cory, MD     Physical Exam: Vitals:   01/01/17 2115 01/01/17 2200 01/01/17 2245 01/01/17 2300  BP: (!) 154/75 (!) 166/77 (!) 150/75 (!) 149/80  Pulse: 74 81 82 78  Resp: Temp:      TempSrc:      SpO2: 95% 94% 96% 98%  Weight:      Height:          Constitutional: NAD, calm, comfortable Vitals:   01/01/17 2115 01/01/17  2200 01/01/17 2245 01/01/17 2300  BP: (!) 154/75 (!) 166/77 (!) 150/75 (!) 149/80  Pulse: 74 81 82 78  Resp: Temp:      TempSrc:      SpO2: 95% 94% 96% 98%  Weight:      Height:       Eyes: PERRL, lids and conjunctivae normal ENMT: Mucous membranes are moist. Posterior pharynx clear of any exudate or lesions. Normal dentition.  Neck: normal appearance, supple, no masses Respiratory: clear to auscultation bilaterally, no wheezing, no crackles. Normal respiratory effort. No accessory muscle use.  Cardiovascular: Normal rate, regular rhythm, no murmurs / rubs / gallops. No extremity edema. 2+ pedal pulses. GI: abdomen is soft and compressible.  No distention.  No tenderness.  Bowel sounds are present.  Transplanted kidney palpated in RLQ. Musculoskeletal:  No joint deformity in upper and lower extremities. Good ROM, no contractures. Normal muscle tone.  Skin: Multiple tattoos present.  Multiple scars and scabs to her legs.  Warm to touch.  She is not clammy. Neurologic: CN 2-12 grossly intact. Sensation intact, Strength symmetric bilaterally.  Psychiatric: Normal judgment and insight. Alert and oriented x 3. Normal mood.     Labs on Admission: I have personally reviewed following labs and imaging studies  CBC:  Recent Labs Lab 12/31/16 1013 01/01/17 1911  WBC 7.5 4.1  HGB 13.0 11.8*  HCT 39.5 35.0*  MCV 89.8 87.7  PLT 187 171   Basic Metabolic Panel:  Recent Labs Lab 12/31/16 1013 01/01/17 1911  NA 128* 130*  K 4.9 4.5  CL 94* 96*  CO2 21* 18*  GLUCOSE 605* 586*  BUN 36* 25*  CREATININE 1.58* 1.48*  CALCIUM 9.9 9.8    GFR: Estimated Creatinine Clearance: 50.2 mL/min (A) (by C-G formula based on SCr of 1.48 mg/dL (H)).  CBG:  Recent Labs Lab 12/31/16 1430 12/31/16 1538 01/01/17 1859 01/01/17 2154 01/01/17 2300  GLUCAP 465* 356* 556* 509* 381*   Urine analysis:    Component Value Date/Time   COLORURINE STRAW (A) 01/01/2017 1910   APPEARANCEUR CLEAR 01/01/2017 1910   LABSPEC 1.017 01/01/2017 1910   PHURINE 5.0 01/01/2017 1910   GLUCOSEU >=500 (A) 01/01/2017 1910   HGBUR SMALL (A) 01/01/2017 1910   BILIRUBINUR NEGATIVE 01/01/2017 1910   KETONESUR 5 (A) 01/01/2017 1910   PROTEINUR 100 (A) 01/01/2017 1910   UROBILINOGEN 0.2 03/02/2015 1153   NITRITE NEGATIVE 01/01/2017 1910   LEUKOCYTESUR NEGATIVE 01/01/2017 1910    Radiological Exams on Admission: Portable Chest X-ray (1 View)  Result Date: 01/01/2017 CLINICAL DATA:  51 year old female with diabetic ketoacidosis. EXAM: PORTABLE CHEST 1 VIEW COMPARISON:  Chest radiograph dated 10/12/2016 FINDINGS: The heart size and mediastinal contours are within normal limits. Both lungs are clear. The visualized skeletal structures are unremarkable. IMPRESSION: No active disease. Electronically Signed   By: Elgie Collard M.D.   On: 01/01/2017 23:57    Assessment/Plan Principal Problem:   DKA (diabetic ketoacidoses) (HCC) Active Problems:   Immunosuppression (HCC)   Hypothyroidism   H/O kidney transplant   Essential hypertension   GERD (gastroesophageal reflux disease)   CKD (chronic kidney disease) stage 2, GFR 60-89 ml/min      DKA, etiology unclear.  Uncontrolled insulin dependent diabetes at baseline. --Regular insulin drip per protocol --BMP q4h --POC glucose testing q1h --Change fluids to D5 1/2 NS when blood sugar less than 250 --Will need diabetes education --Would likely benefit from an alternative insulin regimen but she  clearly says that she is on 70/30 right now because this is what she can afford.  History of renal  transplant --Continue prednisone, Cellcept, Prograf  HTN --Norvasc, coreg  Hypothyroidsim --Levothyroxine  GERD --H2 blocker   DVT prophylaxis: SCDs Code Status: FULL Family Communication: Patient alone in the ED at time of admission. Disposition Plan: Expect she will go home at discharge. Consults called: NONE Admission status: Place in observation, stepdown unit.   TIME SPENT: 60 minutes   Jerene Bears MD [facility] Pager [phone]  If 7PM-7AM, please contact night-coverage [email or web address] Password [email or web address]  01/01/2017, 11:40 PM

## 2017-01-01 NOTE — ED Notes (Addendum)
Pt reports that she has high blood sugars for the past. Pt reports she takes 30 units of 70/30 insulin daily. Pt reports she is scheduled to have a pancreas transplant at [facility]. Pt also reports she was just seen at [facility] yesterday.

## 2017-01-02 DIAGNOSIS — K210 Gastro-esophageal reflux disease with esophagitis: Secondary | ICD-10-CM

## 2017-01-02 DIAGNOSIS — E1110 Type 2 diabetes mellitus with ketoacidosis without coma: Secondary | ICD-10-CM | POA: Diagnosis not present

## 2017-01-02 DIAGNOSIS — I10 Essential (primary) hypertension: Secondary | ICD-10-CM | POA: Diagnosis not present

## 2017-01-02 DIAGNOSIS — N182 Chronic kidney disease, stage 2 (mild): Secondary | ICD-10-CM | POA: Diagnosis not present

## 2017-01-02 LAB — BASIC METABOLIC PANEL
ANION GAP: 6 (ref 5–15)
ANION GAP: 6 (ref 5–15)
BUN: 21 mg/dL — ABNORMAL HIGH (ref 6–20)
BUN: 19 mg/dL (ref 6–20)
CO2: 21 mmol/L — ABNORMAL LOW (ref 22–32)
CO2: 22 mmol/L (ref 22–32)
Calcium: 9.2 mg/dL (ref 8.9–10.3)
Calcium: 9.2 mg/dL (ref 8.9–10.3)
Chloride: 107 mmol/L (ref 101–111)
Chloride: 109 mmol/L (ref 101–111)
Creatinine, Ser: 1.38 mg/dL — ABNORMAL HIGH (ref 0.44–1.00)
Creatinine, Ser: 1.15 mg/dL — ABNORMAL HIGH (ref 0.44–1.00)
GFR calc Af Amer: >60 mL/min (ref >60)
GFR calc non Af Amer: 44 mL/min — ABNORMAL LOW (ref >60)
GFR, EST AFRICAN AMERICAN: 51 mL/min — AB (ref >60)
GFR, EST NON AFRICAN AMERICAN: 55 mL/min — AB (ref >60)
GLUCOSE: 133 mg/dL — AB (ref 65–99)
GLUCOSE: 314 mg/dL — AB (ref 65–99)
POTASSIUM: 3.5 mmol/L (ref 3.5–5.1)
POTASSIUM: 3.6 mmol/L (ref 3.5–5.1)
Sodium: 134 mmol/L — ABNORMAL LOW (ref 135–145)
Sodium: 137 mmol/L (ref 135–145)

## 2017-01-02 LAB — GLUCOSE, CAPILLARY
GLUCOSE-CAPILLARY: 147 mg/dL — AB (ref 65–99)
GLUCOSE-CAPILLARY: 382 mg/dL — AB (ref 65–99)
Glucose-Capillary: 157 mg/dL — ABNORMAL HIGH (ref 65–99)
Glucose-Capillary: 125 mg/dL — ABNORMAL HIGH (ref 65–99)
Glucose-Capillary: 149 mg/dL — ABNORMAL HIGH (ref 65–99)
Glucose-Capillary: 149 mg/dL — ABNORMAL HIGH (ref 65–99)

## 2017-01-02 LAB — HEPATIC FUNCTION PANEL
ALBUMIN: 2.4 g/dL — AB (ref 3.5–5.0)
ALT: 11 U/L — AB (ref 14–54)
AST: 17 U/L (ref 15–41)
Alkaline Phosphatase: 115 U/L (ref 38–126)
TOTAL PROTEIN: 5.6 g/dL — AB (ref 6.5–8.1)
Total Bilirubin: 0.4 mg/dL (ref 0.3–1.2)

## 2017-01-02 LAB — CBG MONITORING, ED: Glucose-Capillary: 247 mg/dL — ABNORMAL HIGH (ref 65–99)

## 2017-01-02 LAB — MRSA PCR SCREENING: MRSA by PCR: POSITIVE — AB

## 2017-01-02 MED ORDER — ENSURE ENLIVE PO LIQD: 237.0000 mL | Freq: Two times a day (BID) | ORAL | Status: DC

## 2017-01-02 MED ORDER — TACROLIMUS 1 MG PO CAPS
1.0000 mg | ORAL_CAPSULE | Freq: Every day | ORAL | Status: DC
  Administered 2017-01-02: 1 mg via ORAL
  Filled 2017-01-02 (×2): qty 1

## 2017-01-02 MED ORDER — INSULIN ASPART PROT & ASPART (70-30 MIX) 100 UNIT/ML ~~LOC~~ SUSP
25.0000 [IU] | Freq: Once | SUBCUTANEOUS | Status: AC
  Administered 2017-01-02: 25 [IU] via SUBCUTANEOUS
  Filled 2017-01-02: qty 10

## 2017-01-02 MED ORDER — INSULIN ASPART 100 UNIT/ML ~~LOC~~ SOLN: 0.0000 [IU] | Freq: Every day | SUBCUTANEOUS | Status: DC

## 2017-01-02 MED ORDER — INSULIN ASPART 100 UNIT/ML ~~LOC~~ SOLN
0.0000 [IU] | Freq: Three times a day (TID) | SUBCUTANEOUS | Status: DC
  Administered 2017-01-02: 2 [IU] via SUBCUTANEOUS
  Administered 2017-01-02: 15 [IU] via SUBCUTANEOUS

## 2017-01-02 MED ORDER — ACETAMINOPHEN 500 MG PO TABS
1000.0000 mg | ORAL_TABLET | Freq: Once | ORAL | Status: DC
  Filled 2017-01-02: qty 2

## 2017-01-02 MED ORDER — INSULIN GLARGINE 100 UNIT/ML ~~LOC~~ SOLN
30.0000 [IU] | Freq: Every day | SUBCUTANEOUS | Status: DC
  Administered 2017-01-02: 30 [IU] via SUBCUTANEOUS
  Filled 2017-01-02: qty 0.3

## 2017-01-02 MED ORDER — INSULIN NPH ISOPHANE & REGULAR (70-30) 100 UNIT/ML ~~LOC~~ SUSP
42.0000 [IU] | Freq: Two times a day (BID) | SUBCUTANEOUS | 0 refills | Status: DC
Start: 2017-01-02 — End: 2017-03-03

## 2017-01-02 MED ORDER — TACROLIMUS 0.5 MG PO CAPS
0.5000 mg | ORAL_CAPSULE | Freq: Every day | ORAL | Status: DC
  Administered 2017-01-02: 0.5 mg via ORAL
  Filled 2017-01-02: qty 1

## 2017-01-02 MED ORDER — PNEUMOCOCCAL VAC POLYVALENT 25 MCG/0.5ML IJ INJ: 0.5000 mL | INJECTION | INTRAMUSCULAR | Status: DC

## 2017-01-02 MED ORDER — LEVOTHYROXINE SODIUM 25 MCG PO TABS
137.0000 ug | ORAL_TABLET | Freq: Every day | ORAL | Status: DC
  Administered 2017-01-02: 08:00:00 137 ug via ORAL
  Filled 2017-01-02: qty 1

## 2017-01-02 MED ORDER — ACETAMINOPHEN 325 MG PO TABS
650.0000 mg | ORAL_TABLET | Freq: Four times a day (QID) | ORAL | Status: DC | PRN
  Administered 2017-01-02: 650 mg via ORAL
  Filled 2017-01-02 (×2): qty 2

## 2017-01-02 MED ORDER — INSULIN ASPART 100 UNIT/ML ~~LOC~~ SOLN: SUBCUTANEOUS | 0 refills | Status: DC

## 2017-01-02 MED ORDER — INSULIN ASPART 100 UNIT/ML ~~LOC~~ SOLN
3.0000 [IU] | Freq: Three times a day (TID) | SUBCUTANEOUS | Status: DC
  Administered 2017-01-02 (×2): 3 [IU] via SUBCUTANEOUS

## 2017-01-02 NOTE — Plan of Care (Signed)
Problem: Health Behavior/Discharge Planning: Goal: Ability to manage health-related needs will improve Outcome: Progressing Orders received and discussed with pt for INSULIN regimen at home.

## 2017-01-02 NOTE — Discharge Summary (Signed)
[name] WUJ:[id number] DOB: [birth year] DOA: 01/01/2017  PCP: Dois Davenport., MD  Admit date: 01/01/2017  Discharge date: 01/02/2017  Admitted From: Home  Disposition:  Home   Recommendations for Outpatient Follow-up:   Follow up with PCP in 1-2 weeks  PCP Please obtain BMP/CBC, 2 view CXR in 1week,  (see Discharge instructions)   PCP Please follow up on the following pending results: [place] CBGs, A1c   Home Health: None Equipment/Devices: None  Consultations: None Discharge Condition: Stable   CODE STATUS: Full   Diet Recommendation: Diet Carb Modified Heart Healthy    Chief Complaint  Patient presents with  . Hyperglycemia     Brief history of present illness from the day of admission and additional interim summary    [name] is a 51 y.o. woman with a history of insulin dependent diabetes, CKD S/P renal transplant [facility], 2011, on prednisone, cellcept, and prograf), and hypothyroidism who presents to the ED for evaluation of symptoms of hyperglycemia.  Symptoms have been present for two days.  She actually presented to the ED at [facility] yesterday.  Her blood sugar was 605 then, and her bicarb was 21.  She improved with 2L of NS and subQ insulin.  She was discharged with plans to follow-up with her endocrinologist [facility]), but this appointment is in June.  It does not appear that any changes were made to her insulin regimen, was found to have DKA in the ER.                                                                    Hospital Course      DKA in DM 2 - resolved after DKA protocol, Home regimen of 70/30  adjusted and added ISS, requested to check CBGs QAC-HS maintain a log book and follow with PCP in 3 days, also Endo outpt in 1 week, symptom free and has testing supplies,  Case Management requested to help with Meds.   History of renal transplant -- Continue prednisone, Cellcept, Prograf, no acute issues, follow with PCP soon.  HTN - stable on Norvasc & Coreg continue.   Hypothyroidism - on Levothyroxine.  GERD - continue on H2 blocker   Discharge diagnosis     Principal Problem:   DKA (diabetic ketoacidoses) (HCC) Active Problems:   Immunosuppression (HCC)   Hypothyroidism   H/O kidney transplant   Essential hypertension   GERD (gastroesophageal reflux disease)   CKD (chronic kidney disease) stage 2, GFR 60-89 ml/min    Discharge instructions    Discharge Instructions    Discharge instructions    Complete by:  As directed    Follow with Primary MD Nadyne Coombes L., MD in 3 days   Get CBC, CMP, 2 view Chest X ray checked  by Primary MD or SNF MD in 3 days ( we routinely change or add medications that can affect your baseline labs and fluid status, therefore we recommend that you get the mentioned basic workup next visit with your PCP, your PCP may decide not to get them or add new tests based on their clinical decision)  Activity: As tolerated with Full fall precautions use walker/cane & assistance as needed  Disposition Home    Diet:   Diet Carb Modified Heart Healthy    Accuchecks 4 times/day, Once in AM empty stomach and then before each meal. Log in all results and show them to your Prim.MD in 3 days. If any glucose reading is under 80 or above 300 call your Prim MD immidiately. Follow Low glucose instructions for glucose under 80 as instructed.  For Heart failure patients - Check your Weight same time everyday, if you gain over 2 pounds, or you develop in leg swelling, experience more shortness of breath or chest pain, call your Primary MD immediately. Follow Cardiac Low Salt Diet and 1.5 lit/day fluid restriction.  On your next visit with your primary care physician please Get Medicines reviewed and adjusted.  Please request  your Prim.MD to go over all Hospital Tests and Procedure/Radiological results at the follow up, please get all Hospital records sent to your Prim MD by signing hospital release before you go home.  If you experience worsening of your admission symptoms, develop shortness of breath, life threatening emergency, suicidal or homicidal thoughts you must seek medical attention immediately by calling 911 or calling your MD immediately  if symptoms less severe.  You Must read complete instructions/literature along with all the possible adverse reactions/side effects for all the Medicines you take and that have been prescribed to you. Take any new Medicines after you have completely understood and accpet all the possible adverse reactions/side effects.   Do not drive, operate heavy machinery, perform activities at heights, swimming or participation in water activities or provide baby sitting services if your were admitted for syncope or siezures until you have seen by Primary MD or a Neurologist and advised to do so again.  Do not drive when taking Pain medications.    Do not take more than prescribed Pain, Sleep and Anxiety Medications  Special Instructions: If you have smoked or chewed Tobacco  in the last 2 yrs please stop smoking, stop any regular Alcohol  and or any Recreational drug use.  Wear Seat belts while driving.   Please note  You were cared for by a hospitalist during your hospital stay. If you have any questions about your discharge medications or the care you received while you were in the hospital after you are discharged, you can call the unit and asked to speak with the hospitalist on call if the hospitalist that took care of you is not available. Once you are discharged, your primary care physician will handle any further medical issues. Please note that NO REFILLS for any discharge medications will be authorized once you are discharged, as it is imperative that you return to your  primary care physician (or establish a relationship with a primary care physician if you do not have one) for your aftercare needs so that they can reassess your need for medications and monitor your lab values.   Increase activity slowly    Complete by:  As directed       Discharge Medications   Allergies as of 01/02/2017  Reactions   Ace Inhibitors Cough   Adhesive [tape] Itching, Other (See Comments)   Please use paper tape   Lisinopril Cough      Medication List    TAKE these medications   acetaminophen 325 MG tablet Commonly known as:  TYLENOL Take 2 tablets (650 mg total) by mouth every 6 (six) hours as needed for mild pain, moderate pain, fever or headache.   amLODipine 10 MG tablet Commonly known as:  NORVASC Take 1 tablet (10 mg total) by mouth daily.   aspirin EC 81 MG tablet Take 81 mg by mouth daily as needed for mild pain.   carvedilol 6.25 MG tablet Commonly known as:  COREG Take 2 tablets (12.5 mg total) by mouth 2 (two) times daily.   fluticasone 50 MCG/ACT nasal spray Commonly known as:  FLONASE Place 1 spray into both nostrils daily.   insulin aspart 100 UNIT/ML injection Commonly known as:  NOVOLOG Before each meal 3 times a day, 140-199 - 2 units, 200-250 - 4 units, 251-299 - 6 units,  300-349 - 8 units,  350 or above 10 units. Insulin PEN if approved, provide syringes and needles if needed.   insulin NPH-regular Human (70-30) 100 UNIT/ML injection Commonly known as:  NOVOLIN 70/30 Inject 42 Units into the skin 2 (two) times daily with a meal. Start 5pm tonight What changed:  how much to take  additional instructions   levothyroxine 137 MCG tablet Commonly known as:  SYNTHROID, LEVOTHROID Take 1 tablet (137 mcg total) by mouth daily. BRAND NAME ONLY   loratadine 10 MG tablet Commonly known as:  CLARITIN Take 1 tablet (10 mg total) by mouth daily.   mycophenolate 250 MG capsule Commonly known as:  CELLCEPT Take 500 mg by mouth 2 (two)  times daily.   predniSONE 5 MG tablet Commonly known as:  DELTASONE Take 5 mg by mouth at bedtime.   ranitidine 150 MG tablet Commonly known as:  ZANTAC Take 150 mg by mouth at bedtime.   tacrolimus 0.5 MG capsule Commonly known as:  PROGRAF Take 0.5-1 mg by mouth 2 (two) times daily. Take .  by mouth in the morning and  by mouth at bedtime       Follow-up Information    RICHTER,KAREN L., MD. Schedule an appointment as soon as possible for a visit in 3 day(s).   Specialty:  Family Medicine Contact information: [address] [place] [address] [phone]        Romero Belling, MD. Schedule an appointment as soon as possible for a visit in 3 day(s).   Specialty:  Endocrinology Why:  DKA Contact information: [address] [place] [address] [phone]           Major procedures and Radiology Reports - PLEASE review detailed and final reports thoroughly  -         Portable Chest X-ray (1 View)  Result Date: 01/01/2017 CLINICAL DATA:  51 year old female with diabetic ketoacidosis. EXAM: PORTABLE CHEST 1 VIEW COMPARISON:  Chest radiograph dated 10/12/2016 FINDINGS: The heart size and mediastinal contours are within normal limits. Both lungs are clear. The visualized skeletal structures are unremarkable. IMPRESSION: No active disease. Electronically Signed   By: Elgie Collard M.D.   On: 01/01/2017 23:57    Micro Results     No results found for this or any previous visit (from the past 240 hour(s)).  Today   Subjective    [name] today has no headache,no  chest abdominal pain,no new weakness tingling or numbness, feels much better wants to go home today.    Objective   Blood pressure (!) 142/73, pulse 74, temperature 98 F (36.7 C), temperature source Oral, resp. rate 15, height  (1.702 m), weight 81.9 kg (180 lb 8 oz), last menstrual period 11/19/2011, SpO2 97 %.   Intake/Output Summary (Last 24 hours) at  01/02/17 0756 Last data filed at 01/02/17 0615  Gross per 24 hour  Intake          2953.56 ml  Output              450 ml  Net          2503.56 ml    Exam Awake Alert, Oriented x 3, No new F.N deficits, Normal affect [place].AT,PERRAL Supple Neck,No JVD, No cervical lymphadenopathy appriciated.  Symmetrical Chest wall movement, Good air movement bilaterally, CTAB RRR,No Gallops,Rubs or new Murmurs, No Parasternal Heave +ve B.Sounds, Abd Soft, Non tender, No organomegaly appriciated, No rebound -guarding or rigidity. No Cyanosis, Clubbing or edema, No new Rash or bruise   Data Review   CBC w Diff: Lab Results  Component Value Date   WBC 4.1 01/01/2017   HGB 11.8 (L) 01/01/2017   HCT 35.0 (L) 01/01/2017   PLT 171 01/01/2017   LYMPHOPCT 7 10/05/2016   MONOPCT 9 10/05/2016   EOSPCT 0 10/05/2016   BASOPCT 0 10/05/2016    CMP: Lab Results  Component Value Date   NA 137 01/02/2017   K 3.5 01/02/2017   CL 109 01/02/2017   CO2 22 01/02/2017   BUN 19 01/02/2017   CREATININE 1.15 (H) 01/02/2017   CREATININE 1.07 10/29/2014   PROT 5.6 (L) 01/02/2017   ALBUMIN 2.4 (L) 01/02/2017   BILITOT 0.4 01/02/2017   ALKPHOS 115 01/02/2017   AST 17 01/02/2017   ALT 11 (L) 01/02/2017  .    Total Time in preparing paper work, data evaluation and todays exam - 35 minutes  Susa Raring M.D on 01/02/2017 at 7:56 AM  [facility]   Office  [phone]

## 2017-01-02 NOTE — Discharge Instructions (Signed)
Follow with Primary MD Dois Davenport., MD in 3 days   Get CBC, CMP, 2 view Chest X ray checked  by Primary MD or SNF MD in 3 days ( we routinely change or add medications that can affect your baseline labs and fluid status, therefore we recommend that you get the mentioned basic workup next visit with your PCP, your PCP may decide not to get them or add new tests based on their clinical decision)  Activity: As tolerated with Full fall precautions use walker/cane & assistance as needed  Disposition Home    Diet:   Diet Carb Modified Heart Healthy    Accuchecks 4 times/day, Once in AM empty stomach and then before each meal. Log in all results and show them to your Prim.MD in 3 days. If any glucose reading is under 80 or above 300 call your Prim MD immidiately. Follow Low glucose instructions for glucose under 80 as instructed.  For Heart failure patients - Check your Weight same time everyday, if you gain over 2 pounds, or you develop in leg swelling, experience more shortness of breath or chest pain, call your Primary MD immediately. Follow Cardiac Low Salt Diet and 1.5 lit/day fluid restriction.  On your next visit with your primary care physician please Get Medicines reviewed and adjusted.  Please request your Prim.MD to go over all Hospital Tests and Procedure/Radiological results at the follow up, please get all Hospital records sent to your Prim MD by signing hospital release before you go home.  If you experience worsening of your admission symptoms, develop shortness of breath, life threatening emergency, suicidal or homicidal thoughts you must seek medical attention immediately by calling 911 or calling your MD immediately  if symptoms less severe.  You Must read complete instructions/literature along with all the possible adverse reactions/side effects for all the Medicines you take and that have been prescribed to you. Take any new Medicines after you have completely understood and  accpet all the possible adverse reactions/side effects.   Do not drive, operate heavy machinery, perform activities at heights, swimming or participation in water activities or provide baby sitting services if your were admitted for syncope or siezures until you have seen by Primary MD or a Neurologist and advised to do so again.  Do not drive when taking Pain medications.    Do not take more than prescribed Pain, Sleep and Anxiety Medications  Special Instructions: If you have smoked or chewed Tobacco  in the last 2 yrs please stop smoking, stop any regular Alcohol  and or any Recreational drug use.  Wear Seat belts while driving.   Please note  You were cared for by a hospitalist during your hospital stay. If you have any questions about your discharge medications or the care you received while you were in the hospital after you are discharged, you can call the unit and asked to speak with the hospitalist on call if the hospitalist that took care of you is not available. Once you are discharged, your primary care physician will handle any further medical issues. Please note that NO REFILLS for any discharge medications will be authorized once you are discharged, as it is imperative that you return to your primary care physician (or establish a relationship with a primary care physician if you do not have one) for your aftercare needs so that they can reassess your need for medications and monitor your lab values.

## 2017-01-02 NOTE — Care Management Note (Signed)
Case Management Note  Patient Details  Name: VALENCIA KASSA MRN: [id number] Date of Birth: [birth year]  Subjective/Objective:                   Hyperglycemia    Action/Plan: Discharge planning Expected Discharge Date:  01/02/17               Expected Discharge Plan:  Home/Self Care  In-House Referral:     Discharge planning Services  CM Consult, Medication Assistance  Post Acute Care Choice:    Choice offered to:  Patient  DME Arranged:  N/A DME Agency:  NA  HH Arranged:  NA HH Agency:  NA  Status of Service:  Completed, signed off  If discussed at [place] of Stay Meetings, dates discussed:    Additional Comments: Cm met with pt in room to discuss medication management.  CM asked pt why she had no drug plan with her Medicare A and Medicare B plan and she states she gets her medication needs met in [place], (pt is [facility] transplant recipient).  Pt does not meet criteria for med asst through the [facility] as she is insured.  No other CM needs were communicated. Dellie Catholic, RN 01/02/2017, 8:43 AM

## 2017-01-26 ENCOUNTER — Encounter (HOSPITAL_COMMUNITY): Payer: Self-pay | Admitting: Family Medicine

## 2017-01-26 ENCOUNTER — Emergency Department (HOSPITAL_COMMUNITY): Payer: Medicare Other

## 2017-01-26 ENCOUNTER — Inpatient Hospital Stay (HOSPITAL_COMMUNITY)
Admission: EM | Admit: 2017-01-26 | Discharge: 2017-01-28 | DRG: 638 | Disposition: A | Discharge status: Home / Self Care | Payer: Medicare Other | Attending: Internal Medicine | Admitting: Internal Medicine

## 2017-01-26 DIAGNOSIS — Z940 Kidney transplant status: Secondary | ICD-10-CM | POA: Diagnosis not present

## 2017-01-26 DIAGNOSIS — I10 Essential (primary) hypertension: Secondary | ICD-10-CM | POA: Diagnosis present

## 2017-01-26 DIAGNOSIS — E1110 Type 2 diabetes mellitus with ketoacidosis without coma: Secondary | ICD-10-CM | POA: Diagnosis present

## 2017-01-26 DIAGNOSIS — D631 Anemia in chronic kidney disease: Secondary | ICD-10-CM | POA: Diagnosis present

## 2017-01-26 DIAGNOSIS — R945 Abnormal results of liver function studies: Secondary | ICD-10-CM | POA: Diagnosis present

## 2017-01-26 DIAGNOSIS — D638 Anemia in other chronic diseases classified elsewhere: Secondary | ICD-10-CM | POA: Diagnosis present

## 2017-01-26 DIAGNOSIS — E1122 Type 2 diabetes mellitus with diabetic chronic kidney disease: Secondary | ICD-10-CM | POA: Diagnosis present

## 2017-01-26 DIAGNOSIS — N183 Chronic kidney disease, stage 3 (moderate): Secondary | ICD-10-CM | POA: Diagnosis present

## 2017-01-26 DIAGNOSIS — I130 Hypertensive heart and chronic kidney disease with heart failure and stage 1 through stage 4 chronic kidney disease, or unspecified chronic kidney disease: Secondary | ICD-10-CM | POA: Diagnosis present

## 2017-01-26 DIAGNOSIS — N179 Acute kidney failure, unspecified: Secondary | ICD-10-CM | POA: Diagnosis present

## 2017-01-26 DIAGNOSIS — F329 Major depressive disorder, single episode, unspecified: Secondary | ICD-10-CM | POA: Diagnosis present

## 2017-01-26 DIAGNOSIS — I509 Heart failure, unspecified: Secondary | ICD-10-CM | POA: Diagnosis present

## 2017-01-26 DIAGNOSIS — E038 Other specified hypothyroidism: Secondary | ICD-10-CM | POA: Diagnosis not present

## 2017-01-26 DIAGNOSIS — R7989 Other specified abnormal findings of blood chemistry: Secondary | ICD-10-CM

## 2017-01-26 DIAGNOSIS — E039 Hypothyroidism, unspecified: Secondary | ICD-10-CM | POA: Diagnosis present

## 2017-01-26 DIAGNOSIS — E0810 Diabetes mellitus due to underlying condition with ketoacidosis without coma: Secondary | ICD-10-CM | POA: Diagnosis not present

## 2017-01-26 DIAGNOSIS — Z794 Long term (current) use of insulin: Secondary | ICD-10-CM

## 2017-01-26 LAB — CBC WITH DIFFERENTIAL/PLATELET
BASOS PCT: 1 %
Basophils Absolute: 0 10*3/uL (ref 0.0–0.1)
EOS ABS: 0.1 10*3/uL (ref 0.0–0.7)
Eosinophils Relative: 1 %
HCT: 34.2 % — ABNORMAL LOW (ref 36.0–46.0)
HEMOGLOBIN: 10.8 g/dL — AB (ref 12.0–15.0)
Lymphocytes Relative: 21 %
Lymphs Abs: 1.3 10*3/uL (ref 0.7–4.0)
MCH: 28.9 pg (ref 26.0–34.0)
MCHC: 31.6 g/dL (ref 30.0–36.0)
MCV: 91.4 fL (ref 78.0–100.0)
MONO ABS: 0.4 10*3/uL (ref 0.1–1.0)
MONOS PCT: 7 %
NEUTROS PCT: 70 %
Neutro Abs: 4.4 10*3/uL (ref 1.7–7.7)
Platelets: 234 10*3/uL (ref 150–400)
RBC: 3.74 MIL/uL — ABNORMAL LOW (ref 3.87–5.11)
RDW: 12.8 % (ref 11.5–15.5)
WBC: 6.2 10*3/uL (ref 4.0–10.5)

## 2017-01-26 LAB — URINALYSIS, ROUTINE W REFLEX MICROSCOPIC
BACTERIA UA: NONE SEEN
BILIRUBIN URINE: NEGATIVE
Glucose, UA: >=500 mg/dL — AB
KETONES UR: 80 mg/dL — AB
LEUKOCYTES UA: NEGATIVE
NITRITE: NEGATIVE
Protein, ur: 100 mg/dL — AB
Specific Gravity, Urine: 1.021 (ref 1.005–1.030)
pH: 5 (ref 5.0–8.0)

## 2017-01-26 LAB — COMPREHENSIVE METABOLIC PANEL
ALBUMIN: 2.7 g/dL — AB (ref 3.5–5.0)
ALT: 11 U/L — ABNORMAL LOW (ref 14–54)
ANION GAP: 20 — AB (ref 5–15)
AST: 12 U/L — ABNORMAL LOW (ref 15–41)
Alkaline Phosphatase: 170 U/L — ABNORMAL HIGH (ref 38–126)
BUN: 44 mg/dL — ABNORMAL HIGH (ref 6–20)
CO2: 13 mmol/L — AB (ref 22–32)
Calcium: 9.3 mg/dL (ref 8.9–10.3)
Chloride: 90 mmol/L — ABNORMAL LOW (ref 101–111)
Creatinine, Ser: 2.06 mg/dL — ABNORMAL HIGH (ref 0.44–1.00)
GFR calc Af Amer: 31 mL/min — ABNORMAL LOW (ref >60)
GFR calc non Af Amer: 27 mL/min — ABNORMAL LOW (ref >60)
Glucose, Bld: 842 mg/dL (ref 65–99)
POTASSIUM: 5.6 mmol/L — AB (ref 3.5–5.1)
SODIUM: 123 mmol/L — AB (ref 135–145)
Total Bilirubin: 1.9 mg/dL — ABNORMAL HIGH (ref 0.3–1.2)
Total Protein: 6.6 g/dL (ref 6.5–8.1)

## 2017-01-26 LAB — CBG MONITORING, ED: Glucose-Capillary: >600 mg/dL (ref 65–99)

## 2017-01-26 LAB — PREGNANCY, URINE: Preg Test, Ur: NEGATIVE

## 2017-01-26 MED ORDER — ENOXAPARIN SODIUM 40 MG/0.4ML ~~LOC~~ SOLN
40.0000 mg | Freq: Every day | SUBCUTANEOUS | Status: DC
  Administered 2017-01-27: 40 mg via SUBCUTANEOUS
  Filled 2017-01-26: qty 0.4

## 2017-01-26 MED ORDER — DEXTROSE-NACL 5-0.45 % IV SOLN
INTRAVENOUS | Status: DC
  Administered 2017-01-27: 06:00:00 via INTRAVENOUS

## 2017-01-26 MED ORDER — SALINE SPRAY 0.65 % NA SOLN
1.0000 | NASAL | Status: DC | PRN
  Administered 2017-01-27: 1 via NASAL
  Filled 2017-01-26 (×2): qty 44

## 2017-01-26 MED ORDER — SODIUM CHLORIDE 0.9 % IV SOLN: INTRAVENOUS | Status: DC

## 2017-01-26 MED ORDER — SODIUM CHLORIDE 0.9 % IV SOLN
INTRAVENOUS | Status: AC
  Administered 2017-01-27: 01:00:00 via INTRAVENOUS

## 2017-01-26 MED ORDER — LORATADINE 10 MG PO TABS
10.0000 mg | ORAL_TABLET | Freq: Every day | ORAL | Status: DC
  Administered 2017-01-27 – 2017-01-28 (×3): 10 mg via ORAL
  Filled 2017-01-26 (×3): qty 1

## 2017-01-26 MED ORDER — ACETAMINOPHEN 325 MG PO TABS
650.0000 mg | ORAL_TABLET | Freq: Four times a day (QID) | ORAL | Status: DC | PRN
  Administered 2017-01-27: 650 mg via ORAL
  Filled 2017-01-26: qty 2

## 2017-01-26 MED ORDER — SODIUM CHLORIDE 0.9 % IV SOLN
INTRAVENOUS | Status: DC
  Administered 2017-01-26: 5.4 [IU]/h via INTRAVENOUS
  Filled 2017-01-26: qty 1

## 2017-01-26 MED ORDER — INSULIN REGULAR HUMAN 100 UNIT/ML IJ SOLN
INTRAMUSCULAR | Status: DC
  Administered 2017-01-27: 12:00:00 via INTRAVENOUS
  Filled 2017-01-26 (×3): qty 1

## 2017-01-26 MED ORDER — ASPIRIN EC 81 MG PO TBEC: 81.0000 mg | DELAYED_RELEASE_TABLET | Freq: Every day | ORAL | Status: DC | PRN

## 2017-01-26 MED ORDER — CARVEDILOL 12.5 MG PO TABS
12.5000 mg | ORAL_TABLET | Freq: Two times a day (BID) | ORAL | Status: DC
  Administered 2017-01-27 – 2017-01-28 (×4): 12.5 mg via ORAL
  Filled 2017-01-26 (×4): qty 1

## 2017-01-26 MED ORDER — LEVOTHYROXINE SODIUM 137 MCG PO TABS
137.0000 ug | ORAL_TABLET | Freq: Every day | ORAL | Status: DC
  Administered 2017-01-27 – 2017-01-28 (×2): 137 ug via ORAL
  Filled 2017-01-26 (×3): qty 1

## 2017-01-26 MED ORDER — MYCOPHENOLATE MOFETIL 250 MG PO CAPS
500.0000 mg | ORAL_CAPSULE | Freq: Two times a day (BID) | ORAL | Status: DC
  Administered 2017-01-27 – 2017-01-28 (×4): 500 mg via ORAL
  Filled 2017-01-26 (×4): qty 2

## 2017-01-26 MED ORDER — SODIUM CHLORIDE 0.9 % IV BOLUS (SEPSIS)
1000.0000 mL | Freq: Once | INTRAVENOUS | Status: AC
  Administered 2017-01-26: 1000 mL via INTRAVENOUS

## 2017-01-26 MED ORDER — AMLODIPINE BESYLATE 10 MG PO TABS
10.0000 mg | ORAL_TABLET | Freq: Every day | ORAL | Status: DC
Start: 2017-01-27 — End: 2017-01-28
  Administered 2017-01-27 – 2017-01-28 (×2): 10 mg via ORAL
  Filled 2017-01-26 (×2): qty 1

## 2017-01-26 MED ORDER — PREDNISONE 5 MG PO TABS
5.0000 mg | ORAL_TABLET | Freq: Every day | ORAL | Status: DC
  Administered 2017-01-27 (×2): 5 mg via ORAL
  Filled 2017-01-26 (×2): qty 1

## 2017-01-26 MED ORDER — FAMOTIDINE 20 MG PO TABS: 20.0000 mg | ORAL_TABLET | Freq: Two times a day (BID) | ORAL | Status: DC

## 2017-01-26 MED ORDER — INSULIN ASPART 100 UNIT/ML ~~LOC~~ SOLN
10.0000 [IU] | Freq: Once | SUBCUTANEOUS | Status: AC
  Administered 2017-01-26: 10 [IU] via SUBCUTANEOUS
  Filled 2017-01-26: qty 1

## 2017-01-26 MED ORDER — DEXTROSE-NACL 5-0.45 % IV SOLN: INTRAVENOUS | Status: DC

## 2017-01-26 MED ORDER — TACROLIMUS 1 MG PO CAPS
1.0000 mg | ORAL_CAPSULE | Freq: Every day | ORAL | Status: DC
  Administered 2017-01-27 (×2): 1 mg via ORAL
  Filled 2017-01-26 (×2): qty 1

## 2017-01-26 NOTE — ED Triage Notes (Signed)
[facility]. Patient is complaining of polyuria since yesterday. On patients home blood sugar meter and EMS's, blood sugar is reading high. EMS reports negative on orthostatics.

## 2017-01-26 NOTE — H&P (Signed)
History and Physical    [name] Pettry OZH:[id number] DOB: 1966/04/22 DOA: 01/26/2017  PCP: Dois Davenportichter, Karen L, MD   Patient coming from: Home.  I have personally briefly reviewed patient[name]  Chief Complaint: High blood sugar.  HPI: [name] Bossier is a 51 y.o. female with medical history significant of osteoarthritis, chronic kidney disease, headaches, depression, hypertension, hypothyroidism, kidney transplant, immunosuppression due to transplant medications, history of pneumonia, insulin requiring type 2 diabetes who is coming to the emergency department with complaints of polyuria, polydipsia since yesterday with her glucometer reading high at home. She denies fever, chills, earaches, sore throat or rhinorrhea. However she complains of dizziness, frontal headaches and nasal congestion. She denies chest pain, palpitations, dyspnea, diaphoresis, pitting edema of the lower extremities PND or orthopnea. Denies abdominal pain, nausea, emesis, diarrhea, constipation, melena or hematochezia. She denies dysuria or hematuria. She denies missing her insulin doses or dietary indiscretions.   ED Course: The patient received normal saline 1 L bolus, insulin 10 units IVP and then was started on insulin infusion. She stated she feels better. Urinalysis showed glucosuria, ketonuria and mild hemoglobinuria. WBC 6.2, hemoglobin 10.8 g/dL and platelets [id number]. Sodium 123, corrected to hyperglycemia 141, potassium 5.6, chloride 90, bicarbonate 13 mmol/L. BUN 44, creatinine 2.06 and glucose 842 mg/dL. Her alkaline phosphatase was 170 units and total bilirubin 1.9 mg/dL.  Review of Systems: As per HPI otherwise 10 point review of systems negative.     Past Medical History:  Diagnosis Date  . Arthritis    "elbows, knees, legs, back" (09/24/2015)  . CKD (chronic kidney disease)   . Daily headache   . Depression    "years ago"  . End stage renal disease (HCC)    right arm  AV graft, post transplant  . Hypertension   . Hypothyroid   . 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 Kidney disease   . Pneumonia ~ 2007?  [name]. Type II diabetes mellitus (HCC)    Insulin dependant    Past Surgical History:  Procedure Laterality Date  . AMPUTATION Left 05/11/2014   Procedure: AMPUTATION LEFT GREAT TOE;  Surgeon: Kathryne Hitchhristopher Y Blackman, MD;  Location: [facility] ORS;  Service: Orthopedics;  Laterality: Left;  . AV FISTULA PLACEMENT Right    forearm  . BACK SURGERY    . CATARACT EXTRACTION W/ INTRAOCULAR LENS  IMPLANT, BILATERAL Bilateral   . CESAREAN SECTION  07/1999  . DG AV DIALYSIS GRAFT DECLOT OR    . INCISION AND DRAINAGE Right 06/02/2015   Procedure: INCISION AND DRAINAGE OF RIGHT 3rd RAY RESECTION;  Surgeon: Kathryne Hitchhristopher Y Blackman, MD;  Location: [facility] OR;  Service: Orthopedics;  Laterality: Right;  . 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 LUMBAR DISC SURGERY  2001  . NEPHRECTOMY TRANSPLANTED ORGAN    . TOE AMPUTATION Right    1,2 & 3rd toes.  . TUBAL LIGATION  07/1999     reports that she quit smoking about 18 years ago. Her smoking use included Cigarettes. She has a 11.00 pack-year smoking history. She has never used smokeless tobacco. She reports that she does not drink alcohol or use drugs.  Allergies  Allergen Reactions  . Ace Inhibitors Cough  . Adhesive [Tape] Itching and Other (See Comments)    Please use paper tape  . Lisinopril Cough    Family History  Problem Relation Age of Onset  . Emphysema Mother   . Throat cancer  Mother   . COPD Mother   . Cancer Mother   . Emphysema Father   . COPD Father   . Stroke Father   . ADD / ADHD Son     Prior to Admission medications   Medication Sig Start Date End Date Taking? Authorizing Provider  amLODipine (NORVASC) 10 MG tablet Take 1 tablet (10 mg total) by mouth daily. 08/14/16  Yes Regalado, Belkys A, MD  carvedilol (COREG) 6.25 MG tablet Take 2 tablets (12.5 mg total) by mouth 2  (two) times daily. 10/09/16  Yes Hongalgi, Maximino Greenland, MD  insulin NPH-regular Human (NOVOLIN 70/30) (70-30) 100 UNIT/ML injection Inject 42 Units into the skin 2 (two) times daily with a meal. Start 5pm tonight Patient taking differently: Inject 40 Units into the skin 2 (two) times daily with a meal. Start 5pm tonight 01/02/17  Yes Leroy Sea, MD  levothyroxine (SYNTHROID, LEVOTHROID) 137 MCG tablet Take 1 tablet (137 mcg total) by mouth daily. BRAND NAME ONLY 03/04/15  Yes Alison Murray, MD  mycophenolate (CELLCEPT) 250 MG capsule Take 500 mg by mouth 2 (two) times daily.    Yes [provider]  predniSONE (DELTASONE) 5 MG tablet Take 5 mg by mouth at bedtime.  05/29/15  Yes [provider]  ranitidine (ZANTAC) 150 MG tablet Take 150 mg by mouth at bedtime.  03/27/16  Yes [provider]  tacrolimus (PROGRAF) 0.5 MG capsule Take 0.5-1 mg by mouth 2 (two) times daily. Take 0.5mg  by mouth in the morning and 1mg  by mouth at bedtime   Yes [provider]  acetaminophen (TYLENOL) 325 MG tablet Take 2 tablets (650 mg total) by mouth every 6 (six) hours as needed for mild pain, moderate pain, fever or headache. 05/15/16   Elease Etienne, MD  aspirin EC 81 MG tablet Take 81 mg by mouth daily as needed for mild pain.     [provider]  fluticasone (FLONASE) 50 MCG/ACT nasal spray Place 1 spray into both nostrils daily. Patient not taking: Reported on 01/26/2017 08/14/16   Regalado, Jon Billings A, MD  insulin aspart (NOVOLOG) 100 UNIT/ML injection Before each meal 3 times a day, 140-199 - 2 units, 200-250 - 4 units, 251-299 - 6 units,  300-349 - 8 units,  350 or above 10 units. Insulin PEN if approved, provide syringes and needles if needed. Patient not taking: Reported on 01/26/2017 01/02/17   Leroy Sea, MD  loratadine (CLARITIN) 10 MG tablet Take 1 tablet (10 mg total) by mouth daily. Patient not taking: Reported on 01/01/2017 08/14/16   Alba Cory, MD     Physical Exam: Vitals:   01/26/17 1900 01/26/17 1906 01/26/17 2112  BP: 117/60  (!) 112/57  Pulse: 77  81  Resp: 16  19  Temp: 97.8 F (36.6 C)    TempSrc: Oral    SpO2: 100% 97% 97%    Constitutional: NAD, calm, comfortable Eyes: PERRL, lids and conjunctivae normal ENMT: Mucous membranes are moist. Posterior pharynx clear of any exudate or lesions. Neck: normal, supple, no masses, no thyromegaly Respiratory: clear to auscultation bilaterally, no wheezing, no crackles. Normal respiratory effort. No accessory muscle use.  Cardiovascular: Regular rate and rhythm, Positive 2/6 diastolic murmur, no rubs / gallops. No extremity edema. 2+ pedal pulses. No carotid bruits.  Abdomen: Soft, no tenderness, no masses palpated. No hepatosplenomegaly. Bowel sounds positive.  Musculoskeletal: no clubbing / cyanosis. First, second and third right toe amputation. Good ROM, no contractures. Normal  muscle tone.  Skin: no rashes, lesions, ulcers.  Neurologic: CN 2-12 grossly intact. Sensation intact, DTR normal. Strength 5/5 in all 4.  Psychiatric: Normal judgment and insight. Alert and oriented x 3. Normal mood.    Labs on Admission: I have personally reviewed following labs and imaging studies  CBC:  Recent Labs Lab 01/26/17 2039  WBC 6.2  NEUTROABS 4.4  HGB 10.8*  HCT 34.2*  MCV 91.4  PLT 234   Basic Metabolic Panel:  Recent Labs Lab 01/26/17 2039  NA 123*  K 5.6*  CL 90*  CO2 13*  GLUCOSE 842*  BUN 44*  CREATININE 2.06*  CALCIUM 9.3   GFR: CrCl cannot be calculated (Unknown ideal weight.). Liver Function Tests:  Recent Labs Lab 01/26/17 2039  AST 12*  ALT 11*  ALKPHOS 170*  BILITOT 1.9*  PROT 6.6  ALBUMIN 2.7*   No results for input([name]): LIPASE, AMYLASE in the last 168 hours. No results for input([name]): AMMONIA in the last 168 hours. Coagulation Profile: No results for input([name]): INR, PROTIME in the last 168 hours. Cardiac Enzymes: No results for input([name]):  CKTOTAL, CKMB, CKMBINDEX, TROPONINI in the last 168 hours. BNP (last 3 results) No results for input([name]): PROBNP in the last 8760 hours. HbA1C: No results for input([name]): HGBA1C in the last 72 hours. CBG:  Recent Labs Lab 01/26/17 1859 01/26/17 2116  GLUCAP >600* >600*   Lipid Profile: No results for input([name]): CHOL, HDL, LDLCALC, TRIG, CHOLHDL, LDLDIRECT in the last 72 hours. Thyroid Function Tests: No results for input([name]): TSH, T4TOTAL, FREET4, T3FREE, THYROIDAB in the last 72 hours. Anemia Panel: No results for input([name]): VITAMINB12, FOLATE, FERRITIN, TIBC, IRON, RETICCTPCT in the last 72 hours. Urine analysis:    Component Value Date/Time   COLORURINE YELLOW 01/26/2017 2039   APPEARANCEUR CLEAR 01/26/2017 2039   LABSPEC 1.021 01/26/2017 2039   PHURINE 5.0 01/26/2017 2039   GLUCOSEU >=500 (A) 01/26/2017 2039   HGBUR SMALL (A) 01/26/2017 2039   BILIRUBINUR NEGATIVE 01/26/2017 2039   KETONESUR 80 (A) 01/26/2017 2039   PROTEINUR 100 (A) 01/26/2017 2039   UROBILINOGEN 0.2 03/02/2015 1153   NITRITE NEGATIVE 01/26/2017 2039   LEUKOCYTESUR NEGATIVE 01/26/2017 2039    Radiological Exams on Admission: Dg Chest 2 View  Result Date: 01/26/2017 CLINICAL DATA:  Chest pain EXAM: CHEST  2 VIEW COMPARISON:  01/01/2017 FINDINGS: The heart size and mediastinal contours are within normal limits. Both lungs are clear. The visualized skeletal structures are unremarkable. IMPRESSION: No active cardiopulmonary disease. Electronically Signed   By: Jasmine Pang M.D.   On: 01/26/2017 21:01 May 2016 echocardiogram ------------------------------------------------------------------- LV EF: 55% -   60%  ------------------------------------------------------------------- Indications:      CHF - 428.0.  ------------------------------------------------------------------- History:   Risk factors:  ESRD. Former tobacco use. Hypertension. Diabetes  mellitus.  ------------------------------------------------------------------- Study Conclusions  - Left ventricle: The cavity size was normal. Wall thickness was   increased in a pattern of mild LVH. Systolic function was normal.   The estimated ejection fraction was in the range of 55% to 60%.   Wall motion was normal; there were no regional wall motion   abnormalities. Features are consistent with a pseudonormal left   ventricular filling pattern, with concomitant abnormal relaxation   and increased filling pressure (grade 2 diastolic dysfunction).   Doppler parameters are consistent with high ventricular filling   pressure. - Aortic valve: There was trivial regurgitation. - Mitral valve: Severely calcified annulus. There was mild   regurgitation. -  Left atrium: The atrium was mildly dilated.  Impressions:  - Normal LV systolic functon; mild LVH; grade 2 diastolic   dysfunction with elevated LV filling pressure; trace AI; mild MR;   mild LAE.   EKG: Independently reviewed. Ordered, still pending.  Assessment/Plan Principal Problem:   DKA (diabetic ketoacidoses) (HCC) Admit to stepdown/inpatient. Keep nothing by mouth. Continue IV hydration. Continue insulin infusion. CBG monitoring every hour. Follow-up BMP every four hours.  Active Problems:   AKI (acute kidney injury) (HCC) Continue IV hydration. Follow-up BUN, creatinine and electrolytes.    Hypomagnesemia Replaced with magnesium sulfate 2 g IVPB 1 dose.    Hypothyroidism Continue levothyroxine 137 g by mouth daily. Monitor TSH periodically.    Essential hypertension Continue amlodipine 10 mg by mouth daily. Continue carvedilol 12.5 mg by mouth twice a day. Monitor blood pressure.    Anemia Check anemia profile. Follow-up hematocrit and hemoglobin in a.m.    Abnormal LFTs Recheck LFTs in a.m. She had  negative liver findings on CT abdomen/pelvis without contrast on 04/10/2016. Check  ultrasound of right upper quadrant in a.m.   DVT prophylaxis: Lovenox SQ. Code Status: Full code. Family Communication:  Disposition Plan: Admit for IV hydration, electrolyte correction and insulin infusion. Consults called:  Admission status: Inpatient/stepdown.   Bobette Mo MD [facility] Pager [phone].  If 7PM-7AM, please contact night-coverage [email or web address] Password [email or web address]  01/26/2017, 11:15 PM

## 2017-01-26 NOTE — ED Notes (Signed)
Bed: WA11 Expected date:  Expected time:  Means of arrival:  Comments: EMS hyperglycemia 

## 2017-01-26 NOTE — ED Notes (Signed)
One unsuccessful labs stick. RN will collect labs at IV start

## 2017-01-26 NOTE — ED Provider Notes (Signed)
[facility]-EMERGENCY DEPT Provider Note   CSN: [id number] Arrival date & time: 01/26/17  [id number]     History   Chief Complaint Chief Complaint  Patient presents with  . Polyuria    HPI [name] is a 51 y.o. female.  HPI  Patient's 51 year old female visiting with high sugar. Patient reports she's been feeling her normal state of health except mild runny nose and urinating frequently. She reports that today her sugars been reading "high". She took her normal insulin but was unable to get under control. She came here. Otherwise feels normal state of health.  Past Medical History:  Diagnosis Date  . Arthritis    "elbows, knees, legs, back" (09/24/2015)  . CKD (chronic kidney disease)   . Daily headache   . Depression    "years ago"  . End stage renal disease (HCC)    right arm AV graft, post transplant  . Hypertension   . Hypothyroid   . 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 Kidney disease   . Pneumonia ~ 2007?  [name]. Type II diabetes mellitus (HCC)    Insulin dependant    Patient Active Problem List   Diagnosis Date Noted  . DKA (diabetic ketoacidoses) (HCC) 08/12/2016  . Diabetic acidosis without coma (HCC)   . DKA, type 2 (HCC) 07/21/2016  . Hypoglycemia   . Wound cellulitis   . CKD (chronic kidney disease) stage 3, GFR 30-59 ml/min 06/26/2016  . Cellulitis 06/26/2016  . Cellulitis of right lower extremity   . Hyperglycemia   . QT prolongation 05/13/2016  . UTI (lower urinary tract infection)   . Hypomagnesemia   . Pressure ulcer 04/11/2016  . Acute encephalopathy 04/11/2016  . Altered mental state 04/11/2016  . Altered mental status 04/10/2016  . Insulin dependent diabetes mellitus (HCC) 04/10/2016  . Diabetic ketoacidosis without coma associated with type 2 diabetes mellitus (HCC) 11/02/2015  . Hyperglycemia without ketosis 09/24/2015  . CKD (chronic kidney disease) stage 2, GFR 60-89 ml/min 09/24/2015  . History of renal transplant  09/24/2015  . Uncontrolled type 1 diabetes mellitus with foot ulcer (HCC) 06/04/2015  . History of renal transplantation 06/04/2015  . Foot ulcer, right (HCC)   . Foot abscess, right   . Diarrhea 06/02/2015  . DM (diabetes mellitus) type 2, uncontrolled, with ketoacidosis (HCC) 06/02/2015  . Type 1 diabetes mellitus with diabetic foot ulcer (HCC) 06/02/2015  . Acute renal failure superimposed on stage 3 chronic kidney disease (HCC) 06/02/2015  . Diabetic foot infection (HCC) 06/01/2015  . Essential hypertension 06/01/2015  . GERD (gastroesophageal reflux disease) 06/01/2015  . Hypotension 03/22/2014  . End stage renal disease (HCC) 10/24/2012  . H/O kidney transplant 08/04/2012  . High anion gap metabolic acidosis 08/04/2012  . Sepsis (HCC) 08/04/2012  . DKA, type 1 (HCC) 09/22/2011  . Hyperkalemia 09/22/2011  . Hyponatremia 09/22/2011  . Immunosuppression (HCC)   . Hypothyroidism     Past Surgical History:  Procedure Laterality Date  . AMPUTATION Left 05/11/2014   Procedure: AMPUTATION LEFT GREAT TOE;  Surgeon: Kathryne Hitchhristopher Y Blackman, MD;  Location: [facility] ORS;  Service: Orthopedics;  Laterality: Left;  . AV FISTULA PLACEMENT Right    forearm  . BACK SURGERY    . CATARACT EXTRACTION W/ INTRAOCULAR LENS  IMPLANT, BILATERAL Bilateral   . CESAREAN SECTION  07/1999  . DG AV DIALYSIS GRAFT DECLOT OR    . INCISION AND DRAINAGE Right 06/02/2015   Procedure: INCISION AND DRAINAGE OF RIGHT  3rd RAY RESECTION;  Surgeon: Kathryne Hitch, MD;  Location: [facility] OR;  Service: Orthopedics;  Laterality: Right;  . 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 LUMBAR DISC SURGERY  2001  . NEPHRECTOMY TRANSPLANTED ORGAN    . TOE AMPUTATION Right    1,2 & 3rd toes.  . TUBAL LIGATION  07/1999    OB History    No data available       Home Medications    Prior to Admission medications   Medication Sig Start Date End Date Taking? Authorizing Provider  amLODipine (NORVASC) 10 MG tablet Take 1  tablet (10 mg total) by mouth daily. 08/14/16  Yes Regalado, Belkys A, MD  carvedilol (COREG) 6.25 MG tablet Take 2 tablets (12.5 mg total) by mouth 2 (two) times daily. 10/09/16  Yes Hongalgi, Maximino Greenland, MD  insulin NPH-regular Human (NOVOLIN 70/30) (70-30) 100 UNIT/ML injection Inject 42 Units into the skin 2 (two) times daily with a meal. Start 5pm tonight Patient taking differently: Inject 40 Units into the skin 2 (two) times daily with a meal. Start 5pm tonight 01/02/17  Yes Leroy Sea, MD  levothyroxine (SYNTHROID, LEVOTHROID) 137 MCG tablet Take 1 tablet (137 mcg total) by mouth daily. BRAND NAME ONLY 03/04/15  Yes Alison Murray, MD  mycophenolate (CELLCEPT) 250 MG capsule Take 500 mg by mouth 2 (two) times daily.    Yes [provider]  predniSONE (DELTASONE) 5 MG tablet Take 5 mg by mouth at bedtime.  05/29/15  Yes [provider]  ranitidine (ZANTAC) 150 MG tablet Take 150 mg by mouth at bedtime.  03/27/16  Yes [provider]  tacrolimus (PROGRAF) 0.5 MG capsule Take 0.5-1 mg by mouth 2 (two) times daily. Take 0.5mg  by mouth in the morning and 1mg  by mouth at bedtime   Yes [provider]  acetaminophen (TYLENOL) 325 MG tablet Take 2 tablets (650 mg total) by mouth every 6 (six) hours as needed for mild pain, moderate pain, fever or headache. 05/15/16   Elease Etienne, MD  aspirin EC 81 MG tablet Take 81 mg by mouth daily as needed for mild pain.     [provider]  fluticasone (FLONASE) 50 MCG/ACT nasal spray Place 1 spray into both nostrils daily. Patient not taking: Reported on 01/26/2017 08/14/16   Regalado, Jon Billings A, MD  insulin aspart (NOVOLOG) 100 UNIT/ML injection Before each meal 3 times a day, 140-199 - 2 units, 200-250 - 4 units, 251-299 - 6 units,  300-349 - 8 units,  350 or above 10 units. Insulin PEN if approved, provide syringes and needles if needed. Patient not taking: Reported on 01/26/2017 01/02/17   Leroy Sea, MD    loratadine (CLARITIN) 10 MG tablet Take 1 tablet (10 mg total) by mouth daily. Patient not taking: Reported on 01/01/2017 08/14/16   Alba Cory, MD    Family History Family History  Problem Relation Age of Onset  . Emphysema Mother   . Throat cancer Mother   . COPD Mother   . Cancer Mother   . Emphysema Father   . COPD Father   . Stroke Father   . ADD / ADHD Son     Social History Social History  Substance Use Topics  . Smoking status: Former Smoker    Packs/day: 1.00    Years: 11.00    Types: Cigarettes    Quit date: 09/21/1998  . Smokeless tobacco: Never Used  . Alcohol use No  Allergies   Ace inhibitors; Adhesive [tape]; and Lisinopril   Review of Systems Review of Systems  Constitutional: Negative for activity change.  Respiratory: Negative for shortness of breath.   Cardiovascular: Negative for chest pain.  Gastrointestinal: Negative for abdominal pain.  Endocrine: Positive for polydipsia and polyuria.  Genitourinary: Positive for frequency and urgency.     Physical Exam Updated Vital Signs BP 117/60 (BP Location: Right Arm)   Pulse 77   Temp 97.8 F (36.6 C) (Oral)   Resp 16   LMP 11/19/2011   SpO2 97% Comment: RA  Physical Exam  Constitutional: She is oriented to person, place, and time. She appears well-developed and well-nourished.  HENT:  Head: Normocephalic and atraumatic.  Eyes: Right eye exhibits no discharge.  Cardiovascular: Normal rate, regular rhythm and normal heart sounds.   No murmur heard. Pulmonary/Chest: Effort normal and breath sounds normal. She has no wheezes. She has no rales.  Abdominal: Soft. She exhibits no distension. There is no tenderness.  Neurological: She is oriented to person, place, and time.  Skin: Skin is warm and dry. She is not diaphoretic.  Chronic skin changes  Psychiatric: She has a normal mood and affect.  Nursing note and vitals reviewed.    ED Treatments / Results  Labs (all labs  ordered are listed, but only abnormal results are displayed) Labs Reviewed  CBG MONITORING, ED - Abnormal; Notable for the following:       Result Value   Glucose-Capillary >600 (*)    All other components within normal limits  CBC WITH DIFFERENTIAL/PLATELET  COMPREHENSIVE METABOLIC PANEL  PREGNANCY, URINE  URINALYSIS, ROUTINE W REFLEX MICROSCOPIC    EKG  EKG Interpretation None       Radiology No results found.  Procedures Procedures (including critical care time)  Medications Ordered in ED Medications  sodium chloride 0.9 % bolus 1,000 mL (not administered)  insulin aspart (novoLOG) injection 10 Units (not administered)     Initial Impression / Assessment and Plan / ED Course  I have reviewed the triage vital signs and the nursing notes.  Pertinent labs & imaging results that were available during my care of the patient were reviewed by me and considered in my medical decision making (see chart for details).     Well-appearing 51 year old female with high sugars. We will get labs. Patient does not have abnormal breathing. . Will get labs to confirm DKA vs HHS vs hyperglycemia. Will get urine and chest xray to look for infection.   Pt in DKA. Will start insulin drip and admit.  \ CRITICAL CARE Performed by: Arlana Hove Total critical care time: 60 minutes Critical care time was exclusive of separately billable procedures and treating other patients. Critical care was necessary to treat or prevent imminent or life-threatening deterioration. Critical care was time spent personally by me on the following activities: development of treatment plan with patient and/or surrogate as well as nursing, discussions with consultants, evaluation of patient's response to treatment, examination of patient, obtaining history from patient or surrogate, ordering and performing treatments and interventions, ordering and review of laboratory studies, ordering and review of  radiographic studies, pulse oximetry and re-evaluation of patient's condition.   Final Clinical Impressions(s) / ED Diagnoses   Final diagnoses:  None    New Prescriptions New Prescriptions   No medications on file     Abelino Derrick, MD 01/26/17 2314

## 2017-01-27 ENCOUNTER — Inpatient Hospital Stay (HOSPITAL_COMMUNITY): Payer: Medicare Other

## 2017-01-27 ENCOUNTER — Encounter (HOSPITAL_COMMUNITY): Payer: Self-pay | Admitting: Internal Medicine

## 2017-01-27 DIAGNOSIS — E0810 Diabetes mellitus due to underlying condition with ketoacidosis without coma: Secondary | ICD-10-CM

## 2017-01-27 DIAGNOSIS — I10 Essential (primary) hypertension: Secondary | ICD-10-CM

## 2017-01-27 DIAGNOSIS — E038 Other specified hypothyroidism: Secondary | ICD-10-CM

## 2017-01-27 LAB — IRON AND TIBC
IRON: 20 ug/dL — AB (ref 28–170)
SATURATION RATIOS: 9 % — AB (ref 10.4–31.8)
TIBC: 235 ug/dL — AB (ref 250–450)
UIBC: 215 ug/dL

## 2017-01-27 LAB — GLUCOSE, CAPILLARY
GLUCOSE-CAPILLARY: 456 mg/dL — AB (ref 65–99)
GLUCOSE-CAPILLARY: 178 mg/dL — AB (ref 65–99)
GLUCOSE-CAPILLARY: 227 mg/dL — AB (ref 65–99)
GLUCOSE-CAPILLARY: 126 mg/dL — AB (ref 65–99)
GLUCOSE-CAPILLARY: 136 mg/dL — AB (ref 65–99)
Glucose-Capillary: 126 mg/dL — ABNORMAL HIGH (ref 65–99)
Glucose-Capillary: 291 mg/dL — ABNORMAL HIGH (ref 65–99)
Glucose-Capillary: 401 mg/dL — ABNORMAL HIGH (ref 65–99)
Glucose-Capillary: 554 mg/dL (ref 65–99)
Glucose-Capillary: 86 mg/dL (ref 65–99)
Glucose-Capillary: 97 mg/dL (ref 65–99)

## 2017-01-27 LAB — CBC
HEMATOCRIT: 32.6 % — AB (ref 36.0–46.0)
Hemoglobin: 11.1 g/dL — ABNORMAL LOW (ref 12.0–15.0)
MCH: 29.2 pg (ref 26.0–34.0)
MCHC: 34 g/dL (ref 30.0–36.0)
MCV: 85.8 fL (ref 78.0–100.0)
Platelets: 218 10*3/uL (ref 150–400)
RBC: 3.8 MIL/uL — ABNORMAL LOW (ref 3.87–5.11)
RDW: 12.6 % (ref 11.5–15.5)
WBC: 5.1 10*3/uL (ref 4.0–10.5)

## 2017-01-27 LAB — BASIC METABOLIC PANEL
ANION GAP: 16 — AB (ref 5–15)
ANION GAP: 6 (ref 5–15)
ANION GAP: 7 (ref 5–15)
Anion gap: 9 (ref 5–15)
BUN: 44 mg/dL — ABNORMAL HIGH (ref 6–20)
BUN: 41 mg/dL — ABNORMAL HIGH (ref 6–20)
BUN: 41 mg/dL — ABNORMAL HIGH (ref 6–20)
BUN: 37 mg/dL — ABNORMAL HIGH (ref 6–20)
CHLORIDE: 101 mmol/L (ref 101–111)
CHLORIDE: 101 mmol/L (ref 101–111)
CHLORIDE: 103 mmol/L (ref 101–111)
CHLORIDE: 96 mmol/L — AB (ref 101–111)
CO2: 16 mmol/L — AB (ref 22–32)
CO2: 22 mmol/L (ref 22–32)
CO2: 23 mmol/L (ref 22–32)
CO2: 23 mmol/L (ref 22–32)
CREATININE: 1.41 mg/dL — AB (ref 0.44–1.00)
CREATININE: 1.61 mg/dL — AB (ref 0.44–1.00)
CREATININE: 1.68 mg/dL — AB (ref 0.44–1.00)
CREATININE: 1.96 mg/dL — AB (ref 0.44–1.00)
Calcium: 9.2 mg/dL (ref 8.9–10.3)
Calcium: 9.2 mg/dL (ref 8.9–10.3)
Calcium: 9.3 mg/dL (ref 8.9–10.3)
Calcium: 9.2 mg/dL (ref 8.9–10.3)
GFR calc Af Amer: 33 mL/min — ABNORMAL LOW (ref >60)
GFR calc non Af Amer: 29 mL/min — ABNORMAL LOW (ref >60)
GFR calc non Af Amer: 35 mL/min — ABNORMAL LOW (ref >60)
GFR calc non Af Amer: 37 mL/min — ABNORMAL LOW (ref >60)
GFR calc non Af Amer: 43 mL/min — ABNORMAL LOW (ref >60)
GFR, EST AFRICAN AMERICAN: 40 mL/min — AB (ref >60)
GFR, EST AFRICAN AMERICAN: 42 mL/min — AB (ref >60)
GFR, EST AFRICAN AMERICAN: 50 mL/min — AB (ref >60)
Glucose, Bld: 579 mg/dL (ref 65–99)
Glucose, Bld: 260 mg/dL — ABNORMAL HIGH (ref 65–99)
Glucose, Bld: 264 mg/dL — ABNORMAL HIGH (ref 65–99)
Glucose, Bld: 173 mg/dL — ABNORMAL HIGH (ref 65–99)
POTASSIUM: 3.6 mmol/L (ref 3.5–5.1)
POTASSIUM: 3.9 mmol/L (ref 3.5–5.1)
POTASSIUM: 4.3 mmol/L (ref 3.5–5.1)
Potassium: 3.6 mmol/L (ref 3.5–5.1)
SODIUM: 131 mmol/L — AB (ref 135–145)
SODIUM: 132 mmol/L — AB (ref 135–145)
SODIUM: 132 mmol/L — AB (ref 135–145)
Sodium: 128 mmol/L — ABNORMAL LOW (ref 135–145)

## 2017-01-27 LAB — RETICULOCYTES
RBC.: 3.8 MIL/uL — AB (ref 3.87–5.11)
Retic Count, Absolute: 57 10*3/uL (ref 19.0–186.0)
Retic Ct Pct: 1.5 % (ref 0.4–3.1)

## 2017-01-27 LAB — MAGNESIUM: MAGNESIUM: 1.6 mg/dL — AB (ref 1.7–2.4)

## 2017-01-27 LAB — HEPATIC FUNCTION PANEL
ALT: 9 U/L — ABNORMAL LOW (ref 14–54)
AST: 15 U/L (ref 15–41)
Albumin: 2.7 g/dL — ABNORMAL LOW (ref 3.5–5.0)
Alkaline Phosphatase: 140 U/L — ABNORMAL HIGH (ref 38–126)
BILIRUBIN TOTAL: 0.6 mg/dL (ref 0.3–1.2)
Total Protein: 6.4 g/dL — ABNORMAL LOW (ref 6.5–8.1)

## 2017-01-27 LAB — FERRITIN: Ferritin: 85 ng/mL (ref 11–307)

## 2017-01-27 LAB — VITAMIN B12: VITAMIN B 12: 133 pg/mL — AB (ref 180–914)

## 2017-01-27 LAB — MRSA PCR SCREENING: MRSA BY PCR: POSITIVE — AB

## 2017-01-27 LAB — PHOSPHORUS: PHOSPHORUS: 3.5 mg/dL (ref 2.5–4.6)

## 2017-01-27 LAB — FOLATE: FOLATE: 25.6 ng/mL (ref >5.9)

## 2017-01-27 LAB — HIV ANTIBODY (ROUTINE TESTING W REFLEX): HIV SCREEN 4TH GENERATION: NONREACTIVE

## 2017-01-27 MED ORDER — MAGNESIUM SULFATE 2 GM/50ML IV SOLN
2.0000 g | Freq: Once | INTRAVENOUS | Status: AC
  Administered 2017-01-27: 2 g via INTRAVENOUS
  Filled 2017-01-27: qty 50

## 2017-01-27 MED ORDER — INSULIN ASPART PROT & ASPART (70-30 MIX) 100 UNIT/ML ~~LOC~~ SUSP
30.0000 [IU] | Freq: Two times a day (BID) | SUBCUTANEOUS | Status: DC
  Administered 2017-01-27 – 2017-01-28 (×2): 30 [IU] via SUBCUTANEOUS
  Filled 2017-01-27: qty 10

## 2017-01-27 MED ORDER — INSULIN ASPART 100 UNIT/ML ~~LOC~~ SOLN: 0.0000 [IU] | Freq: Three times a day (TID) | SUBCUTANEOUS | Status: DC

## 2017-01-27 MED ORDER — INSULIN ASPART 100 UNIT/ML ~~LOC~~ SOLN: 0.0000 [IU] | Freq: Every day | SUBCUTANEOUS | Status: DC

## 2017-01-27 MED ORDER — TACROLIMUS 0.5 MG PO CAPS
0.5000 mg | ORAL_CAPSULE | Freq: Every day | ORAL | Status: DC
  Administered 2017-01-27 – 2017-01-28 (×2): 0.5 mg via ORAL
  Filled 2017-01-27 (×2): qty 1

## 2017-01-27 MED ORDER — SODIUM CHLORIDE 0.9 % IV SOLN
INTRAVENOUS | Status: DC
  Administered 2017-01-27: 19:00:00 via INTRAVENOUS

## 2017-01-27 MED ORDER — CHLORHEXIDINE GLUCONATE CLOTH 2 % EX PADS
6.0000 | MEDICATED_PAD | Freq: Every day | CUTANEOUS | Status: DC
  Administered 2017-01-28: 6 via TOPICAL

## 2017-01-27 MED ORDER — MUPIROCIN 2 % EX OINT
1.0000 "application " | TOPICAL_OINTMENT | Freq: Two times a day (BID) | CUTANEOUS | Status: DC
  Administered 2017-01-27 – 2017-01-28 (×4): 1 via NASAL
  Filled 2017-01-27: qty 22

## 2017-01-27 MED ORDER — INSULIN ASPART 100 UNIT/ML ~~LOC~~ SOLN
0.0000 [IU] | Freq: Three times a day (TID) | SUBCUTANEOUS | Status: DC
  Administered 2017-01-27: 2 [IU] via SUBCUTANEOUS
  Administered 2017-01-28: 5 [IU] via SUBCUTANEOUS
  Administered 2017-01-28: 3 [IU] via SUBCUTANEOUS

## 2017-01-27 NOTE — Care Management Note (Signed)
Case Management Note  Patient Details  Name: Exie Parodyamela S Vroom MRN: [id number] Date of Birth: [birth year]  Subjective/Objective:                   51 y.o. female with medical history significant of osteoarthritis, chronic kidney disease, headaches, depression, hypertension, hypothyroidism, kidney transplant, immunosuppression due to transplant medications, history of pneumonia, insulin requiring type 2 diabetes who is coming to the emergency department with complaints of polyuria, polydipsia since yesterday with her glucometer reading high at home. She denies fever, chills, earaches, sore throat or rhinorrhea. However she complains of dizziness, frontal headaches and nasal congestion. She denies chest pain, palpitations, dyspnea, diaphoresis, pitting edema of the lower extremities PND or orthopnea. Denies abdominal pain, nausea, emesis, diarrhea, constipation, melena or hematochezia. She denies dysuria or hematuria. She denies missing her insulin doses or dietary indiscretions.    Action/Plan: Date:  Jan 27, 2017 Chart reviewed for concurrent status and case management needs. Will continue to follow patient progress. Discharge Planning: following for needs Expected discharge date: [id number] Marcelle SmilingRhonda Davis, BSN, [place]3, [place]   [phone]  Expected Discharge Date:                  Expected Discharge Plan:  Home/Self Care  In-House Referral:     Discharge planning Services  CM Consult  Post Acute Care Choice:    Choice offered to:     DME Arranged:    DME Agency:     HH Arranged:    HH Agency:     Status of Service:  In process, will continue to follow  If discussed at Long Length of Stay Meetings, dates discussed:    Additional Comments:  Golda AcreDavis, Rhonda Lynn, RN 01/27/2017, 8:54 AM

## 2017-01-27 NOTE — Progress Notes (Addendum)
Patient ID: [name], female   DOB: [birth year], 51 y.o.   MRN: [id number]  PROGRESS NOTE    [name] Parodyamela S Alejo  JXB:[id number] DOB: [birth year] DOA: 01/26/2017  PCP: Dois Davenportichter, Karen L, MD   Brief Narrative:  51 year old female with medical history significant for osteoarthritis, chronic kidney disease stage III with baseline creatinine in 05/2016 - 2.5, history of depression, hypertension, hypothyroidism, diabetes mellitus on insulin, kidney transplant on immunosuppressants. Patient presented to ED with worsening polyuria, polydipsia for past 24 hours prior to this admission. Her glucometer reading at home showed a high when she checked her CBG. No fevers or chills. She does have a headaches and dizziness which is chronic. No chest pain or palpitations. Patient was found to be in DKA on the admission. She was started on insulin drip.   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 - Patient found to be in DKA on the admission with urinalysis showing presence of ketones, CBG more than 600, glucose on BNP was 842, anion gap 20 - Patient started on insulin drip per DKA protocol - Patient remains on insulin drip this morning - Appreciate diabetic coordinator consult and recommendations - Monitor in step down unit while patient on insulin drip  Active Problems:   CKD stage III - Baseline creatinine is 2.5 in September 2017 - Creatinine is within baseline range    Anemia of chronic kidney disease - Hemoglobin stable    Hypothyroidism - Continue synthroid       Essential hypertension - Continue Norvasc and carvedilol    Hypomagnesemia - Supplemented - Follow-up BMP tomorrow morning    History of kidney transplant - Continue prednisone, CellCept and Prograf    DVT prophylaxis: Lovenox subcutaneous Code Status: full code  Family Communication: No family at the  bedside Disposition Plan: Remains in step down because she requires insulin drip   Consultants:   DM coordinator   Procedures:   None  Antimicrobials:   None    Subjective: No overnight events.  Objective: Vitals:   01/27/17 0200 01/27/17 0400 01/27/17 0600 01/27/17 0800  BP: (!) 132/40 (!) 135/57 (!) 134/59 (!) 124/54  Pulse: 75 80 77 73  Resp: 18 20 17 19   Temp:  98.6 F (37 C)    TempSrc:  Oral    SpO2: 91% 92% 92% 93%  Weight:  78.4 kg (172 lb 13.5 oz)    Height:        Intake/Output Summary (Last 24 hours) at 01/27/17 0931 Last data filed at 01/27/17 0515  Gross per 24 hour  Intake              750 ml  Output                0 ml  Net              750 ml   Filed Weights   01/26/17 2325 01/27/17 0400  Weight: 74.5 kg (164 lb 3.9 oz) 78.4 kg (172 lb 13.5 oz)    Examination:  General exam: Appears calm and comfortable  Respiratory system: Clear to auscultation. Respiratory effort normal. Cardiovascular system: S1 & S2 heard, RRR. No JVD, murmurs, rubs, gallops or clicks. No pedal edema. Gastrointestinal system: Abdomen is nondistended, soft and nontender. No organomegaly or masses felt. Normal bowel sounds heard. Central nervous system: Alert and oriented. No focal neurological deficits. Extremities: Symmetric  5 x 5 power. Skin: No rashes, lesions or ulcers Psychiatry: Judgement and insight appear normal. Mood & affect appropriate.   Data Reviewed: I have personally reviewed following labs and imaging studies  CBC:  Recent Labs Lab 01/26/17 2039 01/27/17 0347  WBC 6.2 5.1  NEUTROABS 4.4  --   HGB 10.8* 11.1*  HCT 34.2* 32.6*  MCV 91.4 85.8  PLT 234 218   Basic Metabolic Panel:  Recent Labs Lab 01/26/17 2039 01/26/17 2359 01/27/17 0347  NA 123* 128* 132*  131*  K 5.6* 3.9 3.6  3.6  CL 90* 96* 101  101  CO2 13* 16* 22  23  GLUCOSE 842* 579* 260*  264*  BUN 44* 44* 41*  41*  CREATININE 2.06* 1.96* 1.68*  1.61*  CALCIUM 9.3 9.2  9.3  9.2  MG  --  1.6*  --   PHOS  --  3.5  --    GFR: Estimated Creatinine Clearance: 44.6 mL/min (A) (by C-G formula based on SCr of 1.68 mg/dL (H)). Liver Function Tests:  Recent Labs Lab 01/26/17 2039 01/27/17 0347  AST 12* 15  ALT 11* 9*  ALKPHOS 170* 140*  BILITOT 1.9* 0.6  PROT 6.6 6.4*  ALBUMIN 2.7* 2.7*   No results for input(s): LIPASE, AMYLASE in the last 168 hours. No results for input(s): AMMONIA in the last 168 hours. Coagulation Profile: No results for input(s): INR, PROTIME in the last 168 hours. Cardiac Enzymes: No results for input(s): CKTOTAL, CKMB, CKMBINDEX, TROPONINI in the last 168 hours. BNP (last 3 results) No results for input(s): PROBNP in the last 8760 hours. HbA1C: No results for input(s): HGBA1C in the last 72 hours. CBG:  Recent Labs Lab 01/27/17 0010 01/27/17 0111 01/27/17 0213 01/27/17 0315 01/27/17 0739  GLUCAP 554* 456* 401* 291* 178*   Lipid Profile: No results for input(s): CHOL, HDL, LDLCALC, TRIG, CHOLHDL, LDLDIRECT in the last 72 hours. Thyroid Function Tests: No results for input(s): TSH, T4TOTAL, FREET4, T3FREE, THYROIDAB in the last 72 hours. Anemia Panel:  Recent Labs  01/27/17 0347  VITAMINB12 133*  FOLATE 25.6  FERRITIN 85  TIBC 235*  IRON 20*  RETICCTPCT 1.5   Urine analysis:    Component Value Date/Time   COLORURINE YELLOW 01/26/2017 2039   APPEARANCEUR CLEAR 01/26/2017 2039   LABSPEC 1.021 01/26/2017 2039   PHURINE 5.0 01/26/2017 2039   GLUCOSEU >=500 (A) 01/26/2017 2039   HGBUR SMALL (A) 01/26/2017 2039   BILIRUBINUR NEGATIVE 01/26/2017 2039   KETONESUR 80 (A) 01/26/2017 2039   PROTEINUR 100 (A) 01/26/2017 2039   UROBILINOGEN 0.2 03/02/2015 1153   NITRITE NEGATIVE 01/26/2017 2039   LEUKOCYTESUR NEGATIVE 01/26/2017 2039   Sepsis Labs: @LABRCNTIP (procalcitonin:4,lacticidven:4)   MRSA PCR Screening     Status: Abnormal   Collection Time: 01/27/17 12:20 AM  Result Value Ref Range Status    MRSA by PCR POSITIVE (A) NEGATIVE Final      Radiology Studies: Dg Chest 2 View Result Date: 01/26/2017 No active cardiopulmonary disease.    Scheduled Meds: . amLODipine  10 mg Oral Daily  . carvedilol  12.5 mg Oral BID  . enoxaparin (LOVENOX) injection  40 mg Subcutaneous QHS  . levothyroxine  137 mcg Oral QAC breakfast  . loratadine  10 mg Oral Daily  . mupirocin ointment  1 application Nasal BID  . mycophenolate  500 mg Oral BID  . predniSONE  5 mg Oral QHS  . tacrolimus  0.5 mg Oral Daily  . tacrolimus  1 mg Oral QHS   Continuous Infusions: . sodium chloride 125 mL/hr at 01/26/17 2315  . sodium chloride    . dextrose 5 % and 0.45% NaCl 75 mL/hr at 01/27/17 0744  . dextrose 5 % and 0.45% NaCl 125 mL/hr at 01/27/17 0615  . insulin (NOVOLIN-R) infusion 2.3 Units/hr (01/27/17 0930)     LOS: 1 day    Time spent: 25 minutes  Greater than 50% of the time spent on counseling and coordinating the care.   [name], MD [facility] Pager [phone]  If 7PM-7AM, please contact night-coverage [email or web address] Password [facility] 01/27/2017, 9:31 AM

## 2017-01-27 NOTE — Progress Notes (Signed)
CRITICAL VALUE ALERT  Critical Value:  Positive MRSA  Date & Time Notied: 01/27/17 @0153  Provider on floor and rounded on pt. Orders Received/Actions taken:pt placed on contact precautions and standing order initiated. Derinda SisVera Chanci Ojala,rn.

## 2017-01-27 NOTE — Progress Notes (Signed)
Spoke with RN caring for pt today.  RN states patient is eating PO diet this afternoon.  10am BMET shows CO2 up to 23 and Anion Gap down to 6.     MD- If you desire to transition patient off IV Insulin drip this afternoon, recommend the following:  1. Start 70/30 Insulin- 30 units BID with meals tonight with supper (30 units BID would be 75% total home dose)  2. Give 1st dose 70/30 Insulin and then continue IV Insulin drip for 1 hour after 70/30 insulin given.  Then d/c IV Insulin drip.  3. Also recommend starting Novolog Moderate Correction Scale/ SSI (0-15 units) TID AC + HS      --Will follow patient during hospitalization--  Ambrose FinlandJeannine Johnston Lecia Esperanza RN, MSN, CDE Diabetes Coordinator Inpatient Glycemic Control Team Team Pager: [phone] (8a-5p)

## 2017-01-27 NOTE — Progress Notes (Signed)
Inpatient Diabetes Program Recommendations  AACE/ADA: New Consensus Statement on Inpatient Glycemic Control (2015)  Target Ranges:  Prepandial:   less than 140 mg/dL      Peak postprandial:   less than 180 mg/dL (1-2 hours)      Critically ill patients:  140 - 180 mg/dL   Results for [name], [name] (MRN [id number]) as of 01/27/2017 09:24  Ref. Range 01/26/2017 20:39  Sodium Latest Ref Range: 135 - 145 mmol/L 123 (L)  Potassium Latest Ref Range: 3.5 - 5.1 mmol/L 5.6 (H)  Chloride Latest Ref Range: 101 - 111 mmol/L 90 (L)  CO2 Latest Ref Range: 22 - 32 mmol/L 13 (L)  Glucose Latest Ref Range: 65 - 99 mg/dL [id number] (HH)  BUN Latest Ref Range: 6 - 20 mg/dL 44 (H)  Creatinine Latest Ref Range: 0.44 - 1.00 mg/dL 9.562.06 (H)  Calcium Latest Ref Range: 8.9 - 10.3 mg/dL 9.3  Anion gap Latest Ref Range: 5 - 15  20 (H)    Admit with: DKA  History: DM, CKD, Kidney Transplant  Home DM Meds: 70/30 Insulin- 40 units BID  Current Insulin Orders: IV Insulin drip       -Patient well known to the Inpatient Diabetes Program.    -Patient admitted 8 times for DKA in 2017.  This is patient'[name] 4th admission since January of this year for DKA.  -Patient has been counseled by the DM Coordinator during past visits.  Patient was recently counseled by DM Coordinator back in February of this year.  Patient admitted to being forgetful to take her insulin at times.   MD- Note 4am BMET results show CO2 up to 22 and Anion Gap down to 9.  CBGs have also stabilized.  When patient is ready to transition to SQ insulin, please consider the following:  If patient is allowed to eat PO diet and patient feels up to eating, could transition back to 70/30 Insulin later today.  Could give one-time dose of NPH Insulin to hold patient until she starts back on her 70/30 Insulin with dinner tonight.  Could try NPH 20 units X 1 dose (0.25 units/kg dosing).  Give NPH, continue IV Insulin drip for 1 hour, and then can d/c IV  Insulin drip and start patient back on her 70/30 Insulin tonight with dinner.  Would start 70/30 Insulin 30 units BID with meals tonight at dinner (75% total home dose) + Novolog Moderate Correction Scale/ SSI (0-15 units) TID AC + HS     --Will follow patient during hospitalization--  Ambrose FinlandJeannine Johnston Alwyn Cordner RN, MSN, CDE Diabetes Coordinator Inpatient Glycemic Control Team Team Pager: [phone] (8a-5p)

## 2017-01-28 DIAGNOSIS — N179 Acute kidney failure, unspecified: Secondary | ICD-10-CM

## 2017-01-28 LAB — CBC
HCT: 34 % — ABNORMAL LOW (ref 36.0–46.0)
HEMOGLOBIN: 11.1 g/dL — AB (ref 12.0–15.0)
MCH: 28.7 pg (ref 26.0–34.0)
MCHC: 32.6 g/dL (ref 30.0–36.0)
MCV: 87.9 fL (ref 78.0–100.0)
Platelets: 209 10*3/uL (ref 150–400)
RBC: 3.87 MIL/uL (ref 3.87–5.11)
RDW: 13 % (ref 11.5–15.5)
WBC: 4.5 10*3/uL (ref 4.0–10.5)

## 2017-01-28 LAB — GLUCOSE, CAPILLARY
GLUCOSE-CAPILLARY: 85 mg/dL (ref 65–99)
GLUCOSE-CAPILLARY: 171 mg/dL — AB (ref 65–99)
Glucose-Capillary: 215 mg/dL — ABNORMAL HIGH (ref 65–99)
Glucose-Capillary: 141 mg/dL — ABNORMAL HIGH (ref 65–99)
Glucose-Capillary: 176 mg/dL — ABNORMAL HIGH (ref 65–99)
Glucose-Capillary: 241 mg/dL — ABNORMAL HIGH (ref 65–99)
Glucose-Capillary: 166 mg/dL — ABNORMAL HIGH (ref 65–99)
Glucose-Capillary: 239 mg/dL — ABNORMAL HIGH (ref 65–99)

## 2017-01-28 LAB — HEMOGLOBIN A1C
Hgb A1c MFr Bld: 10.4 % — ABNORMAL HIGH (ref 4.8–5.6)
Mean Plasma Glucose: 252 mg/dL

## 2017-01-28 LAB — BASIC METABOLIC PANEL
Anion gap: 5 (ref 5–15)
BUN: 30 mg/dL — ABNORMAL HIGH (ref 6–20)
CALCIUM: 9.4 mg/dL (ref 8.9–10.3)
CHLORIDE: 111 mmol/L (ref 101–111)
CO2: 22 mmol/L (ref 22–32)
CREATININE: 1.58 mg/dL — AB (ref 0.44–1.00)
GFR calc non Af Amer: 37 mL/min — ABNORMAL LOW (ref >60)
GFR, EST AFRICAN AMERICAN: 43 mL/min — AB (ref >60)
Glucose, Bld: 140 mg/dL — ABNORMAL HIGH (ref 65–99)
Potassium: 4.3 mmol/L (ref 3.5–5.1)
Sodium: 138 mmol/L (ref 135–145)

## 2017-01-28 LAB — MAGNESIUM: Magnesium: 1.9 mg/dL (ref 1.7–2.4)

## 2017-01-28 MED ORDER — HYDRALAZINE HCL 20 MG/ML IJ SOLN
10.0000 mg | Freq: Once | INTRAMUSCULAR | Status: AC
  Administered 2017-01-28: 10 mg via INTRAVENOUS
  Filled 2017-01-28: qty 1

## 2017-01-28 NOTE — Progress Notes (Signed)
Date: Jan 28, 2017 Discharge orders review for case management needs.  None found Fallyn Munnerlyn, BSN, RN3, CCM: [phone] 

## 2017-01-28 NOTE — Discharge Summary (Signed)
Physician Discharge Summary  [name] ZOX:[id number] DOB: November 21, 1965 DOA: 01/26/2017  PCP: Dois Davenport, MD  Admit date: 01/26/2017 Discharge date: 01/28/2017  Recommendations for Outpatient Follow-up:  Continue insulin regimen as per prior to this admission.  Discharge Diagnoses:  Principal Problem:   DKA (diabetic ketoacidoses) (HCC) Active Problems:   Hypothyroidism   AKI (acute kidney injury) (HCC)   Essential hypertension   Hypomagnesemia   Anemia   Abnormal LFTs    Discharge Condition: stable   Diet recommendation: as tolerated   History of present illness:  51 year old female with medical history significant for osteoarthritis, chronic kidney disease stage III with baseline creatinine in 05/2016 - 2.5, history of depression, hypertension, hypothyroidism, diabetes mellitus on insulin, kidney transplant on immunosuppressants. Patient presented to ED with worsening polyuria, polydipsia for past 24 hours prior to this admission. Her glucometer reading at home showed a high when she checked her CBG. No fevers or chills. She does have a headaches and dizziness which is chronic. No chest pain or palpitations. Patient was found to be in DKA on the admission. She was started on insulin drip.  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 - Patient found to be in DKA on the admission with urinalysis showing presence of ketones, CBG more than 600, glucose on BNP was 842, anion gap 20 - Off of insulin drip - Continue home insulin regimen    Active Problems:   CKD stage III - Baseline creatinine is 2.5 in September 2017 - Creatinine is within baseline range    Anemia of chronic kidney disease - Hemoglobin stable    Hypothyroidism - Continue synthroid       Essential hypertension - Continue Norvasc and carvedilol    Hypomagnesemia -  Supplemented and WNL    History of kidney transplant - Continue prednisone, CellCept and Prograf    DVT prophylaxis: Lovenox subcutaneous Code Status: full code  Family Communication: No family at the bedside    Consultants:   DM coordinator   Procedures:   None  Antimicrobials:   None     Signed:  [name], MD  [facility] 01/28/2017, 11:08 AM  Pager #: [phone]  Time spent in minutes: less than 30 minutes   Discharge Exam: Vitals:   01/28/17 0700 01/28/17 0800  BP:    Pulse: 74 74  Resp: 19 17  Temp:  97.7 F (36.5 C)   Vitals:   01/28/17 0400 01/28/17 0600 01/28/17 0700 01/28/17 0800  BP: (!) 182/77 (!) 154/62    Pulse: 73 73 74 74  Resp: 20 15 19 17   Temp: 97.6 F (36.4 C)   97.7 F (36.5 C)  TempSrc: Oral   Oral  SpO2: 94% 94% 96% 96%  Weight:      Height:        General: Pt is alert, follows commands appropriately, not in acute distress Cardiovascular: Regular rate and rhythm, S1/S2 + Respiratory: Clear to auscultation bilaterally, no wheezing, no crackles, no rhonchi Abdominal: Soft, non tender, non distended, bowel sounds +, no guarding Extremities: no edema, no cyanosis, pulses palpable bilaterally DP and PT Neuro: Grossly nonfocal  Discharge Instructions  Discharge Instructions    Call MD for:  persistant nausea and vomiting    Complete by:  As directed    Call MD for:  redness, tenderness, or signs of infection (pain, swelling, redness, odor or green/yellow  discharge around incision site)    Complete by:  As directed    Call MD for:  severe uncontrolled pain    Complete by:  As directed    Diet - low sodium heart healthy    Complete by:  As directed    Discharge instructions    Complete by:  As directed    Continue insulin regimen as per prior to this admission.   Increase activity slowly    Complete by:  As directed      Allergies as of 01/28/2017      Reactions   Ace Inhibitors Cough   Adhesive  [tape] Itching, Other (See Comments)   Please use paper tape   Lisinopril Cough      Medication List    STOP taking these medications   fluticasone 50 MCG/ACT nasal spray Commonly known as:  FLONASE   loratadine 10 MG tablet Commonly known as:  CLARITIN     TAKE these medications   acetaminophen 325 MG tablet Commonly known as:  TYLENOL Take 2 tablets (650 mg total) by mouth every 6 (six) hours as needed for mild pain, moderate pain, fever or headache.   amLODipine 10 MG tablet Commonly known as:  NORVASC Take 1 tablet (10 mg total) by mouth daily.   aspirin EC 81 MG tablet Take 81 mg by mouth daily as needed for mild pain.   carvedilol 6.25 MG tablet Commonly known as:  COREG Take 2 tablets (12.5 mg total) by mouth 2 (two) times daily.   insulin aspart 100 UNIT/ML injection Commonly known as:  NOVOLOG Before each meal 3 times a day, 140-199 - 2 units, 200-250 - 4 units, 251-299 - 6 units,  300-349 - 8 units,  350 or above 10 units. Insulin PEN if approved, provide syringes and needles if needed.   insulin NPH-regular Human (70-30) 100 UNIT/ML injection Commonly known as:  NOVOLIN 70/30 Inject 42 Units into the skin 2 (two) times daily with a meal. Start 5pm tonight What changed:  how much to take  additional instructions   levothyroxine 137 MCG tablet Commonly known as:  SYNTHROID, LEVOTHROID Take 1 tablet (137 mcg total) by mouth daily. BRAND NAME ONLY   mycophenolate 250 MG capsule Commonly known as:  CELLCEPT Take 500 mg by mouth 2 (two) times daily.   predniSONE 5 MG tablet Commonly known as:  DELTASONE Take 5 mg by mouth at bedtime.   ranitidine 150 MG tablet Commonly known as:  ZANTAC Take 150 mg by mouth at bedtime.   tacrolimus 0.5 MG capsule Commonly known as:  PROGRAF Take 0.5-1 mg by mouth 2 (two) times daily. Take 0.5mg  by mouth in the morning and 1mg  by mouth at bedtime      Follow-up Information    Dois Davenport, MD. Schedule an  appointment as soon as possible for a visit in 1 week(s).   Specialty:  Family Medicine Contact information: [address] [place] [address] [phone]            The results of significant diagnostics from this hospitalization (including imaging, microbiology, ancillary and laboratory) are listed below for reference.    Significant Diagnostic Studies: Dg Chest 2 View  Result Date: 01/26/2017 CLINICAL DATA:  Chest pain EXAM: CHEST  2 VIEW COMPARISON:  01/01/2017 FINDINGS: The heart size and mediastinal contours are within normal limits. Both lungs are clear. The visualized skeletal structures are unremarkable. IMPRESSION: No active cardiopulmonary disease. Electronically Signed   By:  Jasmine Pang M.D.   On: 01/26/2017 21:25   Portable Chest X-ray (1 View)  Result Date: 01/01/2017 CLINICAL DATA:  51 year old female with diabetic ketoacidosis. EXAM: PORTABLE CHEST 1 VIEW COMPARISON:  Chest radiograph dated 10/12/2016 FINDINGS: The heart size and mediastinal contours are within normal limits. Both lungs are clear. The visualized skeletal structures are unremarkable. IMPRESSION: No active disease. Electronically Signed   By: Elgie Collard M.D.   On: 01/01/2017 23:57   US Abdomen Limited Ruq  Result Date: 01/27/2017 CLINICAL DATA:  Abnormal liver function tests. EXAM: US ABDOMEN LIMITED - RIGHT UPPER QUADRANT COMPARISON:  None. FINDINGS: Gallbladder: No gallstones or wall thickening visualized. No sonographic Murphy sign noted by sonographer. Common bile duct: Diameter: 8.8 mm which is dilated. Liver: No focal lesion identified. Slightly increased and coarse echotexture of hepatic parenchyma is noted. No intrahepatic biliary dilatation is noted. IMPRESSION: Slightly increasing coarse echotexture of hepatic parenchyma is noted suggesting diffuse hepatocellular disease. Common bile duct is mildly dilated, but no intrahepatic dilatation is noted. Correlation with liver function tests  is recommended as well as possible MRCP to evaluate for distal common bile duct obstruction. Electronically Signed   By: Lupita Raider, M.D.   On: 01/27/2017 09:31    Microbiology: Recent Results (from the past 240 hour(s))  MRSA PCR Screening     Status: Abnormal   Collection Time: 01/27/17 12:20 AM  Result Value Ref Range Status   MRSA by PCR POSITIVE (A) NEGATIVE Final    Comment:        The GeneXpert MRSA Assay (FDA approved for NASAL specimens only), is one component of a comprehensive MRSA colonization surveillance program. It is not intended to diagnose MRSA infection nor to guide or monitor treatment for MRSA infections. RESULT CALLED TO, READ BACK BY AND VERIFIED WITH: V.WILLIAMS,RN 0152 01/27/17 W.SHEA      Labs: Basic Metabolic Panel:  Recent Labs Lab 01/26/17 2039 01/26/17 2359 01/27/17 0347 01/27/17 1009 01/28/17 0322  NA 123* 128* 132*  131* 132* 138  K 5.6* 3.9 3.6  3.6 4.3 4.3  CL 90* 96* 101  101 103 111  CO2 13* 16* 22  23 23 22   GLUCOSE 842* 579* 260*  264* 173* 140*  BUN 44* 44* 41*  41* 37* 30*  CREATININE 2.06* 1.96* 1.68*  1.61* 1.41* 1.58*  CALCIUM 9.3 9.2 9.3  9.2 9.2 9.4  MG  --  1.6*  --   --  1.9  PHOS  --  3.5  --   --   --    Liver Function Tests:  Recent Labs Lab 01/26/17 2039 01/27/17 0347  AST 12* 15  ALT 11* 9*  ALKPHOS 170* 140*  BILITOT 1.9* 0.6  PROT 6.6 6.4*  ALBUMIN 2.7* 2.7*   No results for input(s): LIPASE, AMYLASE in the last 168 hours. No results for input(s): AMMONIA in the last 168 hours. CBC:  Recent Labs Lab 01/26/17 2039 01/27/17 0347 01/28/17 0322  WBC 6.2 5.1 4.5  NEUTROABS 4.4  --   --   HGB 10.8* 11.1* 11.1*  HCT 34.2* 32.6* 34.0*  MCV 91.4 85.8 87.9  PLT 234 218 209   Cardiac Enzymes: No results for input(s): CKTOTAL, CKMB, CKMBINDEX, TROPONINI in the last 168 hours. BNP: BNP (last 3 results) No results for input(s): BNP in the last 8760 hours.  ProBNP (last 3 results) No  results for input(s): PROBNP in the last 8760 hours.  CBG:  Recent Labs Lab 01/27/17  1554 01/27/17 1711 01/27/17 1820 01/27/17 2136 01/28/17 0737  GLUCAP 126* 136* 97 86 239*

## 2017-01-28 NOTE — Discharge Instructions (Signed)
Blood Glucose Monitoring, Adult Monitoring your blood sugar (glucose) helps you manage your diabetes. It also helps you and your health care provider determine how well your diabetes management plan is working. Blood glucose monitoring involves checking your blood glucose as often as directed, and keeping a record (log) of your results over time. Why should I monitor my blood glucose? Checking your blood glucose regularly can:  Help you understand how food, exercise, illnesses, and medicines affect your blood glucose.  Let you know what your blood glucose is at any time. You can quickly tell if you are having low blood glucose (hypoglycemia) or high blood glucose (hyperglycemia).  Help you and your health care provider adjust your medicines as needed. When should I check my blood glucose? Follow instructions from your health care provider about how often to check your blood glucose. This may depend on:  The type of diabetes you have.  How well-controlled your diabetes is.  Medicines you are taking. If you have type 1 diabetes:   Check your blood glucose at least 2 times a day.  Also check your blood glucose:  Before every insulin injection.  Before and after exercise.  Between meals.  2 hours after a meal.  Occasionally between 2:00 a.m. and 3:00 a.m., as directed.  Before potentially dangerous tasks, like driving or using heavy machinery.  At bedtime.  You may need to check your blood glucose more often, up to 6-10 times a day:  If you use an insulin pump.  If you need multiple daily injections (MDI).  If your diabetes is not well-controlled.  If you are ill.  If you have a history of severe hypoglycemia.  If you have a history of not knowing when your blood glucose is getting low (hypoglycemia unawareness). If you have type 2 diabetes:   If you take insulin or other diabetes medicines, check your blood glucose at least 2 times a day.  If you are on intensive  insulin therapy, check your blood glucose at least 4 times a day. Occasionally, you may also need to check between 2:00 a.m. and 3:00 a.m., as directed.  Also check your blood glucose:  Before and after exercise.  Before potentially dangerous tasks, like driving or using heavy machinery.  You may need to check your blood glucose more often if:  Your medicine is being adjusted.  Your diabetes is not well-controlled.  You are ill. What is a blood glucose log?  A blood glucose log is a record of your blood glucose readings. It helps you and your health care provider:  Look for patterns in your blood glucose over time.  Adjust your diabetes management plan as needed.  Every time you check your blood glucose, write down your result and notes about things that may be affecting your blood glucose, such as your diet and exercise for the day.  Most glucose meters store a record of glucose readings in the meter. Some meters allow you to download your records to a computer. How do I check my blood glucose? Follow these steps to get accurate readings of your blood glucose: Supplies needed    Blood glucose meter.  Test strips for your meter. Each meter has its own strips. You must use the strips that come with your meter.  A needle to prick your finger (lancet). Do not use lancets more than once.  A device that holds the lancet (lancing device).  A journal or log book to write down your results. Procedure  Wash your hands with soap and water.  Prick the side of your finger (not the tip) with the lancet. Use a different finger each time.  Gently rub the finger until a small drop of blood appears.  Follow instructions that come with your meter for inserting the test strip, applying blood to the strip, and using your blood glucose meter.  Write down your result and any notes. Alternative testing sites   Some meters allow you to use areas of your body other than your finger  (alternative sites) to test your blood.  If you think you may have hypoglycemia, or if you have hypoglycemia unawareness, do not use alternative sites. Use your finger instead.  Alternative sites may not be as accurate as the fingers, because blood flow is slower in these areas. This means that the result you get may be delayed, and it may be different from the result that you would get from your finger.  The most common alternative sites are:  Forearm.  Thigh.  Palm of the hand. Additional tips   Always keep your supplies with you.  If you have questions or need help, all blood glucose meters have a 24-hour hotline number that you can call. You may also contact your health care provider.  After you use a few boxes of test strips, adjust (calibrate) your blood glucose meter by following instructions that came with your meter. This information is not intended to replace advice given to you by your health care provider. Make sure you discuss any questions you have with your health care provider. Document Released: 08/27/2003 Document Revised: 03/13/2016 Document Reviewed: 02/03/2016 Elsevier Interactive Patient Education  2017 [employer].   Correction Insulin Correction insulin, also called corrective insulin or a supplemental dose, is a small amount of insulin that can be used to lower your blood sugar (glucose) if it is too high. You may be instructed to check your blood glucose at certain times of the day and to use correction insulin as needed to lower your blood glucose to your target range. Correction insulin is primarily used as part of diabetes management. It may also be prescribed for people who do not have diabetes. What is a correction scale? A correction scale, also called a sliding scale, is prescribed by your health care provider to help you determine when you need correction insulin. Your correction scale is based on your individual treatment goals, and it has two  parts:  Ranges of blood glucose levels.  How much correction insulin to give yourself if your blood sugar falls within a certain range. If your blood glucose is in your desired range, you will not need correction insulin and you should take your normal insulin dose. What type of insulin do I need? Your health care provider may prescribe rapid-acting or short-acting insulin for you to use as correction insulin. Rapid-acting insulin:   Starts working quickly, in as little as 5 minutes.  Can last for 3-6 hours.  Works well when taken right before a meal to quickly lower blood glucose. Short-acting insulin:   Starts working in about 30 minutes.  Can last for 6-8 hours.  Should be taken about 30 minutes before you start eating a meal. Talk with your health care provider or pharmacist about which type of correction insulin to take and when to take it. If you use insulin to control your diabetes, you should use correction insulin in addition to the longer-acting (basal) insulin that you normally use. How do  I manage my blood glucose with correction insulin? Giving a correction dose   Check your blood glucose as directed by your health care provider.  Use your correction scale to find the range that your blood glucose is in.  Identify the units of insulin that match your blood glucose range.  Make sure you have food available that you can eat in the next 15-30 minutes, after your correction dose.  Give yourself the dose of correction insulin that your health care provider has prescribed in your correction scale. Always make sure you are using the right type of insulin.  If your correction insulin is rapid-acting, start eating a meal within 15 minutes after your correction dose to keep your blood glucose from getting too low.  If your correction insulin is short-acting, start eating a meal within 30 minutes after your correction dose to keep your blood glucose from getting too  low. Keeping a blood glucose log   Write down your blood glucose test results and the amount of insulin that you give yourself. Do this every time you check blood glucose or take insulin. Bring this log with you to your medical visits. This information will help your health care provider to manage your medicines.  Note anything that may affect your blood glucose, such as:  Changes in normal exercise or activity.  Changes in your normal schedule, such as changes in your sleep routine, going on vacation, changing your diet, or holidays.  New over-the-counter or prescription medicines.  Illness, stress, or anxiety.  Changes in the time that you took your medicine or insulin.  Changes in your meals, such as skipping a meal, having a late meal, or dining out.  Eating things that may affect blood glucose, such as snacks, meal portions that are larger than normal, drinks that contain sugar, or eating less than usual. What do I need to know about hyperglycemia and hypoglycemia? What is hyperglycemia?  Hyperglycemia, also called high blood glucose, occurs when blood glucose is too high. Make sure you know the early signs of hyperglycemia, such as:  Increased thirst.  Hunger.  Feeling very tired.  Needing to urinate more often than usual.  Blurry vision. What is hypoglycemia?  Hypoglycemia is also called low blood glucose. Be aware of stacking your insulin doses. This happens when you correct a high blood glucose by giving yourself extra insulin too soon after a previous correction dose or mealtime dose. This may cause you to have too much insulin in your body and may put you at risk for hypoglycemia. Hypoglycemia occurs with a blood glucose level at or below 70 mg/dL (3.9 mmol/L). It is important to know the symptoms of hypoglycemia and treat it right away. Always have a 15-gram rapid-acting carbohydrate snack with you to treat low blood glucose. Family members and close friends should  also know the symptoms and should understand how to treat hypoglycemia, in case you are not able to treat yourself. What are the symptoms of hypoglycemia?  Hypoglycemia symptoms can include:  Hunger.  Anxiety.  Sweating and feeling clammy.  Confusion.  Dizziness or light-headedness.  Sleepiness.  Nausea.  Increased heart rate.  Headache.  Blurry vision.  Jerky movements that you cannot control (seizure).  Nightmares.  Tingling or numbness around the mouth, lips, or tongue.  A change in speech.  Decreased ability to concentrate.  A change in coordination.  Restless sleep.  Tremors or shakes.  Fainting.  Irritability. How do I treat hypoglycemia?  If you  are alert and able to swallow safely, follow the 15:15 rule:  Take 15 grams of a rapid-acting carbohydrate. Rapid-acting options include:  1 tube of glucose gel.  3 glucose pills.  6-8 pieces of hard candy.  4 oz (120 mL) of fruit juice.  4 oz (120 mL) of regular (not diet) soda.  Check your blood glucose 15 minutes after you take the carbohydrate.  If the repeat blood glucose level is still at or below 70 mg/dL (3.9 mmol/L), take 15 grams of a carbohydrate again.  If your blood glucose level does not increase above 70 mg/dL (3.9 mmol/L) after 3 tries, seek emergency medical care.  After your blood glucose level returns to normal, eat a meal or a snack within 1 hour. How do I treat severe hypoglycemia?  Severe hypoglycemia is when your blood glucose level is at or below 54 mg/dL (3 mmol/L). Severe hypoglycemia is an emergency. Do not wait to see if the symptoms will go away. Get medical help right away. Call your local emergency services (911 in the [place] Do not drive yourself to the hospital. If you have severe hypoglycemia and you cannot eat or drink, you may need an injection of glucagon. A family member or close friend should learn how to check your blood glucose and how to give you a glucagon  injection. Ask your health care provider if you need to have an emergency glucagon injection kit available. Severe hypoglycemia may need to be treated in a hospital. The treatment may include getting glucose through an IV tube. You may also need treatment for the cause of your hypoglycemia. Why do I need correction insulin if I do not have diabetes? If you do not have diabetes, your health care provider may prescribe insulin because:  Keeping your blood glucose in the target range is important for your overall health.  You are taking medicines that cause your blood glucose to be higher than normal. Contact a health care provider if:  You develop low blood glucose that you are not able to treat yourself.  You have high blood glucose that you are not able to correct with correction insulin.  Your blood glucose is often too low.  You used emergency glucagon to treat low blood glucose. Get help right away if:  You become unresponsive. If this happens, someone else should call emergency services (911 in the [place] right away.  Your blood glucose is lower than 54 mg/dL (3.0 mmol/L).  You become confused or you have trouble thinking clearly.  You have difficulty breathing. Summary  Correction insulin is a small amount of insulin that can be used to lower your blood sugar (glucose) if it is too high.  Talk with your health care provider or pharmacist about which type of correction insulin to take and when to take it. If you use insulin to control your diabetes, you should use correction insulin in addition to the longer-acting (basal) insulin that you normally use.  You may be instructed to check your blood glucose at certain times of the day and to use correction insulin as needed to lower your blood glucose to your target range. Always keep a log of your blood glucose values and the amount of insulin that you used.  It is important to know the symptoms of hypoglycemia and treat it right  away. Always have a 15-gram rapid-acting carbohydrate snack with you to treat low blood glucose. Family members and close friends should also know the symptoms and should  understand how to treat hypoglycemia, in case you are not able to treat yourself. This information is not intended to replace advice given to you by your health care provider. Make sure you discuss any questions you have with your health care provider. Document Released: 01/15/2011 Document Revised: 05/22/2016 Document Reviewed: 05/22/2016 Elsevier Interactive Patient Education  2017 [employer].

## 2017-02-28 ENCOUNTER — Encounter (HOSPITAL_COMMUNITY): Payer: Self-pay

## 2017-02-28 ENCOUNTER — Inpatient Hospital Stay (HOSPITAL_COMMUNITY)
Admission: EM | Admit: 2017-02-28 | Discharge: 2017-03-03 | DRG: 638 | Disposition: A | Discharge status: Home / Self Care | Payer: Medicare Other | Attending: Internal Medicine | Admitting: Internal Medicine

## 2017-02-28 DIAGNOSIS — Z940 Kidney transplant status: Secondary | ICD-10-CM

## 2017-02-28 DIAGNOSIS — D649 Anemia, unspecified: Secondary | ICD-10-CM | POA: Diagnosis present

## 2017-02-28 DIAGNOSIS — N179 Acute kidney failure, unspecified: Secondary | ICD-10-CM | POA: Diagnosis present

## 2017-02-28 DIAGNOSIS — I10 Essential (primary) hypertension: Secondary | ICD-10-CM | POA: Diagnosis present

## 2017-02-28 DIAGNOSIS — Z888 Allergy status to other drugs, medicaments and biological substances status: Secondary | ICD-10-CM | POA: Diagnosis not present

## 2017-02-28 DIAGNOSIS — Z79899 Other long term (current) drug therapy: Secondary | ICD-10-CM | POA: Diagnosis not present

## 2017-02-28 DIAGNOSIS — D899 Disorder involving the immune mechanism, unspecified: Secondary | ICD-10-CM | POA: Diagnosis not present

## 2017-02-28 DIAGNOSIS — I129 Hypertensive chronic kidney disease with stage 1 through stage 4 chronic kidney disease, or unspecified chronic kidney disease: Secondary | ICD-10-CM | POA: Diagnosis present

## 2017-02-28 DIAGNOSIS — E039 Hypothyroidism, unspecified: Secondary | ICD-10-CM | POA: Diagnosis present

## 2017-02-28 DIAGNOSIS — E1110 Type 2 diabetes mellitus with ketoacidosis without coma: Secondary | ICD-10-CM

## 2017-02-28 DIAGNOSIS — E10649 Type 1 diabetes mellitus with hypoglycemia without coma: Secondary | ICD-10-CM | POA: Diagnosis present

## 2017-02-28 DIAGNOSIS — N183 Chronic kidney disease, stage 3 (moderate): Secondary | ICD-10-CM | POA: Diagnosis present

## 2017-02-28 DIAGNOSIS — E1010 Type 1 diabetes mellitus with ketoacidosis without coma: Principal | ICD-10-CM | POA: Diagnosis present

## 2017-02-28 DIAGNOSIS — E1022 Type 1 diabetes mellitus with diabetic chronic kidney disease: Secondary | ICD-10-CM | POA: Diagnosis present

## 2017-02-28 DIAGNOSIS — Z9119 Patient's noncompliance with other medical treatment and regimen: Secondary | ICD-10-CM | POA: Diagnosis not present

## 2017-02-28 DIAGNOSIS — E0810 Diabetes mellitus due to underlying condition with ketoacidosis without coma: Secondary | ICD-10-CM | POA: Diagnosis not present

## 2017-02-28 DIAGNOSIS — E038 Other specified hypothyroidism: Secondary | ICD-10-CM | POA: Diagnosis not present

## 2017-02-28 DIAGNOSIS — Z79890 Hormone replacement therapy (postmenopausal): Secondary | ICD-10-CM

## 2017-02-28 DIAGNOSIS — D849 Immunodeficiency, unspecified: Secondary | ICD-10-CM | POA: Diagnosis present

## 2017-02-28 DIAGNOSIS — Z794 Long term (current) use of insulin: Secondary | ICD-10-CM

## 2017-02-28 DIAGNOSIS — K219 Gastro-esophageal reflux disease without esophagitis: Secondary | ICD-10-CM | POA: Diagnosis present

## 2017-02-28 DIAGNOSIS — E860 Dehydration: Secondary | ICD-10-CM | POA: Diagnosis present

## 2017-02-28 LAB — URINALYSIS, ROUTINE W REFLEX MICROSCOPIC
BILIRUBIN URINE: NEGATIVE
Glucose, UA: >=500 mg/dL — AB
KETONES UR: 80 mg/dL — AB
LEUKOCYTES UA: NEGATIVE
NITRITE: NEGATIVE
PH: 5 (ref 5.0–8.0)
PROTEIN: 100 mg/dL — AB
Specific Gravity, Urine: 1.018 (ref 1.005–1.030)

## 2017-02-28 LAB — CBC
HEMATOCRIT: 35.6 % — AB (ref 36.0–46.0)
HEMOGLOBIN: 11.3 g/dL — AB (ref 12.0–15.0)
MCH: 28.6 pg (ref 26.0–34.0)
MCHC: 31.7 g/dL (ref 30.0–36.0)
MCV: 90.1 fL (ref 78.0–100.0)
Platelets: 171 10*3/uL (ref 150–400)
RBC: 3.95 MIL/uL (ref 3.87–5.11)
RDW: 13.2 % (ref 11.5–15.5)
WBC: 5.2 10*3/uL (ref 4.0–10.5)

## 2017-02-28 LAB — BASIC METABOLIC PANEL
ANION GAP: 19 — AB (ref 5–15)
BUN: 36 mg/dL — ABNORMAL HIGH (ref 6–20)
CHLORIDE: 94 mmol/L — AB (ref 101–111)
CO2: 14 mmol/L — ABNORMAL LOW (ref 22–32)
Calcium: 8.9 mg/dL (ref 8.9–10.3)
Creatinine, Ser: 2.32 mg/dL — ABNORMAL HIGH (ref 0.44–1.00)
GFR calc Af Amer: 27 mL/min — ABNORMAL LOW (ref >60)
GFR, EST NON AFRICAN AMERICAN: 23 mL/min — AB (ref >60)
GLUCOSE: 766 mg/dL — AB (ref 65–99)
POTASSIUM: 4.7 mmol/L (ref 3.5–5.1)
Sodium: 127 mmol/L — ABNORMAL LOW (ref 135–145)

## 2017-02-28 LAB — BLOOD GAS, VENOUS
ACID-BASE DEFICIT: 16.3 mmol/L — AB (ref 0.0–2.0)
Bicarbonate: 11.7 mmol/L — ABNORMAL LOW (ref 20.0–28.0)
FIO2: 21
O2 Saturation: 92.7 %
PCO2 VEN: 35.5 mmHg — AB (ref 44.0–60.0)
PH VEN: 7.143 — AB (ref 7.250–7.430)
PO2 VEN: 94.2 mmHg — AB (ref 32.0–45.0)
Patient temperature: 98.6

## 2017-02-28 LAB — CBG MONITORING, ED: Glucose-Capillary: >600 mg/dL (ref 65–99)

## 2017-02-28 MED ORDER — POTASSIUM CHLORIDE CRYS ER 20 MEQ PO TBCR
20.0000 meq | EXTENDED_RELEASE_TABLET | Freq: Once | ORAL | Status: AC
  Administered 2017-02-28: 20 meq via ORAL
  Filled 2017-02-28: qty 1

## 2017-02-28 MED ORDER — SODIUM CHLORIDE 0.9 % IV SOLN
INTRAVENOUS | Status: DC
  Administered 2017-02-28: 5.4 [IU]/h via INTRAVENOUS
  Filled 2017-02-28: qty 1

## 2017-02-28 MED ORDER — SODIUM CHLORIDE 0.9 % IV BOLUS (SEPSIS)
1000.0000 mL | Freq: Once | INTRAVENOUS | Status: AC
Start: 2017-02-28 — End: 2017-03-01
  Administered 2017-02-28: 1000 mL via INTRAVENOUS

## 2017-02-28 MED ORDER — DEXTROSE-NACL 5-0.45 % IV SOLN: INTRAVENOUS | Status: DC

## 2017-02-28 MED ORDER — SODIUM CHLORIDE 0.9 % IV SOLN
INTRAVENOUS | Status: DC
  Administered 2017-02-28: 23:00:00 via INTRAVENOUS

## 2017-02-28 MED ORDER — SODIUM CHLORIDE 0.9 % IV BOLUS (SEPSIS): 1000.0000 mL | Freq: Once | INTRAVENOUS | Status: DC

## 2017-02-28 NOTE — ED Triage Notes (Signed)
Pt arrived via EMS from home Pt has HX of DM and has not taken insulin in 1.5 days. Pt has hx of DKA. Intial CBG reading was HIGH ( > 600+) and post 1000 L bolus (>600+). Pt reports to EMS that she has had a lot on her mind and that she forgot to take her insulin.    BP 110/62 HR 60, RR 14, 98% RA

## 2017-02-28 NOTE — ED Notes (Signed)
Bed: ZO10WA15 Expected date:  Expected time:  Means of arrival:  Comments: 66 f hyperglycemia

## 2017-02-28 NOTE — ED Notes (Signed)
Date and time results received: 02/28/17  1018pm (use smartphrase ".now" to insert current time)  Test: venous blood gas Critical Value: PH 7.143  Name of Provider Notified: Isaacs  Orders Received? Or Actions Taken?: nonen

## 2017-02-28 NOTE — ED Provider Notes (Signed)
[facility]-EMERGENCY DEPT Provider Note   CSN: [id number] Arrival date & time: 02/28/17  2127     History   Chief Complaint Chief Complaint  Patient presents with  . Hyperglycemia    HPI [name] is a 51 y.o. female.  HPI   51 yo F with h/o CKD, chronic immune suppression s/p kidney transplant, IDDM here with fatigue. Pt was just recently hospitalized in May for DKA. She has h/o recurrent visits for same. Pt states that due to recent stressors, she has forgotten to take her insulin frequently. She believes her last dose of insulin was on Thursday PM. Since then, she has had worsening fatigue, polyuria, and polydipsia. She denies any abdominal pain, nausea, vomiting. No cough. No fevers. No recent illnesses. No chest pain. No SOB.  Past Medical History:  Diagnosis Date  . Arthritis    "elbows, knees, legs, back" (09/24/2015)  . CKD (chronic kidney disease)   . Daily headache   . Depression    "years ago"  . End stage renal disease (HCC)    right arm AV graft, post transplant  . Hypertension   . Hypothyroid   . 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 Kidney disease   . Pneumonia ~ 2007?  [name]. Type II diabetes mellitus (HCC)    Insulin dependant    Patient Active Problem List   Diagnosis Date Noted  . Anemia 01/26/2017  . Abnormal LFTs 01/26/2017  . DKA (diabetic ketoacidoses) (HCC) 08/12/2016  . Diabetic acidosis without coma (HCC)   . DKA, type 2 (HCC) 07/21/2016  . Hypoglycemia   . Wound cellulitis   . CKD (chronic kidney disease) stage 3, GFR 30-59 ml/min 06/26/2016  . Cellulitis 06/26/2016  . Cellulitis of right lower extremity   . Hyperglycemia   . QT prolongation 05/13/2016  . UTI (lower urinary tract infection)   . Hypomagnesemia   . Pressure ulcer 04/11/2016  . Acute encephalopathy 04/11/2016  . Altered mental state 04/11/2016  . Altered mental status 04/10/2016  . Insulin dependent diabetes mellitus (HCC) 04/10/2016  . Diabetic  ketoacidosis without coma associated with type 2 diabetes mellitus (HCC) 11/02/2015  . Hyperglycemia without ketosis 09/24/2015  . CKD (chronic kidney disease) stage 2, GFR 60-89 ml/min 09/24/2015  . History of renal transplant 09/24/2015  . Uncontrolled type 1 diabetes mellitus with foot ulcer (HCC) 06/04/2015  . History of renal transplantation 06/04/2015  . Foot ulcer, right (HCC)   . Foot abscess, right   . Diarrhea 06/02/2015  . DM (diabetes mellitus) type 2, uncontrolled, with ketoacidosis (HCC) 06/02/2015  . Type 1 diabetes mellitus with diabetic foot ulcer (HCC) 06/02/2015  . Acute renal failure superimposed on stage 3 chronic kidney disease (HCC) 06/02/2015  . Diabetic foot infection (HCC) 06/01/2015  . Essential hypertension 06/01/2015  . GERD (gastroesophageal reflux disease) 06/01/2015  . AKI (acute kidney injury) (HCC) 01/02/2015  . Hypotension 03/22/2014  . End stage renal disease (HCC) 10/24/2012  . H/O kidney transplant 08/04/2012  . High anion gap metabolic acidosis 08/04/2012  . Sepsis (HCC) 08/04/2012  . DKA, type 1 (HCC) 09/22/2011  . Hyperkalemia 09/22/2011  . Hyponatremia 09/22/2011  . Immunosuppression (HCC)   . Hypothyroidism     Past Surgical History:  Procedure Laterality Date  . AMPUTATION Left 05/11/2014   Procedure: AMPUTATION LEFT GREAT TOE;  Surgeon: Kathryne Hitchhristopher Y Blackman, MD;  Location: [facility]S;  Service: Orthopedics;  Laterality: Left;  . AV FISTULA PLACEMENT Right  forearm  . BACK SURGERY    . CATARACT EXTRACTION W/ INTRAOCULAR LENS  IMPLANT, BILATERAL Bilateral   . CESAREAN SECTION  07/1999  . DG AV DIALYSIS GRAFT DECLOT OR    . INCISION AND DRAINAGE Right 06/02/2015   Procedure: INCISION AND DRAINAGE OF RIGHT 3rd RAY RESECTION;  Surgeon: Kathryne Hitch, MD;  Location: [facility] OR;  Service: Orthopedics;  Laterality: Right;  . 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 LUMBAR DISC SURGERY  2001  . NEPHRECTOMY TRANSPLANTED ORGAN    . TOE  AMPUTATION Right    1,2 & 3rd toes.  . TUBAL LIGATION  07/1999    OB History    No data available       Home Medications    Prior to Admission medications   Medication Sig Start Date End Date Taking? Authorizing Provider  acetaminophen (TYLENOL) 325 MG tablet Take 2 tablets (650 mg total) by mouth every 6 (six) hours as needed for mild pain, moderate pain, fever or headache. 05/15/16  Yes Hongalgi, Maximino Greenland, MD  amLODipine (NORVASC) 10 MG tablet Take 1 tablet (10 mg total) by mouth daily. 08/14/16  Yes Regalado, Belkys A, MD  aspirin EC 81 MG tablet Take 81 mg by mouth daily as needed for mild pain.    Yes [provider]  carvedilol (COREG) 25 MG tablet Take 50 mg by mouth 2 (two) times daily. 02/26/17  Yes [provider]  insulin aspart (NOVOLOG) 100 UNIT/ML injection Before each meal 3 times a day, 140-199 - 2 units, 200-250 - 4 units, 251-299 - 6 units,  300-349 - 8 units,  350 or above 10 units. Insulin PEN if approved, provide syringes and needles if needed. 01/02/17  Yes Leroy Sea, MD  insulin NPH-regular Human (NOVOLIN 70/30) (70-30) 100 UNIT/ML injection Inject 42 Units into the skin 2 (two) times daily with a meal. Start 5pm tonight Patient taking differently: Inject 40 Units into the skin 2 (two) times daily with a meal. Start 5pm tonight 01/02/17  Yes Leroy Sea, MD  levothyroxine (SYNTHROID, LEVOTHROID) 137 MCG tablet Take 1 tablet (137 mcg total) by mouth daily. BRAND NAME ONLY 03/04/15  Yes Alison Murray, MD  mycophenolate (CELLCEPT) 250 MG capsule Take 500 mg by mouth 2 (two) times daily.    Yes [provider]  predniSONE (DELTASONE) 5 MG tablet Take 5 mg by mouth at bedtime.  05/29/15  Yes [provider]  ranitidine (ZANTAC) 150 MG tablet Take 150 mg by mouth at bedtime.  03/27/16  Yes [provider]  tacrolimus (PROGRAF) 0.5 MG capsule Take 0.5-1 mg by mouth 2 (two) times daily. Take 0.5mg  by mouth in the morning and  1mg  by mouth at bedtime   Yes [provider]  carvedilol (COREG) 6.25 MG tablet Take 2 tablets (12.5 mg total) by mouth 2 (two) times daily. Patient not taking: Reported on 02/28/2017 10/09/16   Elease Etienne, MD    Family History Family History  Problem Relation Age of Onset  . Emphysema Mother   . Throat cancer Mother   . COPD Mother   . Cancer Mother   . Emphysema Father   . COPD Father   . Stroke Father   . ADD / ADHD Son     Social History Social History  Substance Use Topics  . Smoking status: Former Smoker    Packs/day: 1.00    Years: 11.00    Types: Cigarettes  Quit date: 09/21/1998  . Smokeless tobacco: Never Used  . Alcohol use No     Allergies   Ace inhibitors; Adhesive [tape]; and Lisinopril   Review of Systems Review of Systems  Constitutional: Positive for fatigue.  Endocrine: Positive for polydipsia, polyphagia and polyuria.  Neurological: Positive for weakness.  All other systems reviewed and are negative.    Physical Exam Updated Vital Signs BP (!) 119/58 (BP Location: Left Arm)   Pulse 77   Temp 97.7 F (36.5 C) (Oral)   Resp 17   Ht 5\' 9"  (1.753 m)   Wt 78.5 kg (173 lb)   LMP 11/19/2011   SpO2 95%   BMI 25.55 kg/m   Physical Exam  Constitutional: She is oriented to person, place, and time. She appears well-developed and well-nourished. No distress.  HENT:  Head: Normocephalic and atraumatic.  Dry MM  Eyes: Conjunctivae are normal.  Neck: Neck supple.  Cardiovascular: Normal rate, regular rhythm and normal heart sounds.  Exam reveals no friction rub.   No murmur heard. Pulmonary/Chest: Effort normal and breath sounds normal. No respiratory distress. She has no wheezes. She has no rales.  Abdominal: Soft. She exhibits no distension.  Musculoskeletal: She exhibits no edema.  Neurological: She is alert and oriented to person, place, and time. She exhibits normal muscle tone.  Skin: Skin is warm. Capillary refill takes  less than 2 seconds.  Psychiatric: She has a normal mood and affect.  Nursing note and vitals reviewed.    ED Treatments / Results  Labs (all labs ordered are listed, but only abnormal results are displayed) Labs Reviewed  BASIC METABOLIC PANEL - Abnormal; Notable for the following:       Result Value   Sodium 127 (*)    Chloride 94 (*)    CO2 14 (*)    Glucose, Bld 766 (*)    BUN 36 (*)    Creatinine, Ser 2.32 (*)    GFR calc non Af Amer 23 (*)    GFR calc Af Amer 27 (*)    Anion gap 19 (*)    All other components within normal limits  CBC - Abnormal; Notable for the following:    Hemoglobin 11.3 (*)    HCT 35.6 (*)    All other components within normal limits  URINALYSIS, ROUTINE W REFLEX MICROSCOPIC - Abnormal; Notable for the following:    Color, Urine STRAW (*)    Glucose, UA >=500 (*)    Hgb urine dipstick SMALL (*)    Ketones, ur 80 (*)    Protein, ur 100 (*)    Bacteria, UA RARE (*)    Squamous Epithelial / LPF 0-5 (*)    All other components within normal limits  BLOOD GAS, VENOUS - Abnormal; Notable for the following:    pH, Ven 7.143 (*)    pCO2, Ven 35.5 (*)    pO2, Ven 94.2 (*)    Bicarbonate 11.7 (*)    Acid-base deficit 16.3 (*)    All other components within normal limits  CBG MONITORING, ED - Abnormal; Notable for the following:    Glucose-Capillary >600 (*)    All other components within normal limits    EKG  EKG Interpretation None       Radiology No results found.  Procedures .Critical Care Performed by: Shaune Pollack Authorized by: Shaune Pollack     (including critical care time)  CRITICAL CARE Performed by: Dollene Cleveland   Total critical care time: 86  minutes  Critical care time was exclusive of separately billable procedures and treating other patients.  Critical care was necessary to treat or prevent imminent or life-threatening deterioration.  Critical care was time spent personally by me on the following  activities: development of treatment plan with patient and/or surrogate as well as nursing, discussions with consultants, evaluation of patient's response to treatment, examination of patient, obtaining history from patient or surrogate, ordering and performing treatments and interventions, ordering and review of laboratory studies, ordering and review of radiographic studies, pulse oximetry and re-evaluation of patient's condition.    Medications Ordered in ED Medications  sodium chloride 0.9 % bolus 1,000 mL (1,000 mLs Intravenous New Bag/Given 02/28/17 2232)  insulin regular (NOVOLIN R,HUMULIN R) 100 Units in sodium chloride 0.9 % 100 mL (1 Units/mL) infusion (not administered)  0.9 %  sodium chloride infusion ( Intravenous New Bag/Given 02/28/17 2233)  dextrose 5 %-0.45 % sodium chloride infusion (not administered)  potassium chloride SA (K-DUR,KLOR-CON) CR tablet 20 mEq (not administered)     Initial Impression / Assessment and Plan / ED Course  I have reviewed the triage vital signs and the nursing notes.  Pertinent labs & imaging results that were available during my care of the patient were reviewed by me and considered in my medical decision making (see chart for details).    51 yo F with PMHx as above here with polyuria, polydipsia, and fatigue after forgetting to take her insulin for at least 3-4 days. VSS. Exam as above. Lab work is c/w acute DKA with pH 7.14, bicarb 14, AG 19, and marked hyperglycemia. Pt also has significant AKI with Cr 2.3, up from 1.6 one month ago. Pt given 1L NS, placed on mIVF and insulin gtt. Will admit to step down. Pt mentating well. No apparent infectious sx and suspect etiology is non-adherence.  Final Clinical Impressions(s) / ED Diagnoses   Final diagnoses:  Diabetic ketoacidosis without coma associated with type 2 diabetes mellitus (HCC)  AKI (acute kidney injury) (HCC)      Shaune Pollack, MD 03/01/17 1605

## 2017-02-28 NOTE — ED Notes (Signed)
Pt us alert and oriented x 4 and is verbally responsive. Pt denies pain at this time.

## 2017-03-01 ENCOUNTER — Encounter (HOSPITAL_COMMUNITY): Payer: Self-pay | Admitting: Internal Medicine

## 2017-03-01 ENCOUNTER — Inpatient Hospital Stay (HOSPITAL_COMMUNITY): Payer: Medicare Other

## 2017-03-01 DIAGNOSIS — D638 Anemia in other chronic diseases classified elsewhere: Secondary | ICD-10-CM

## 2017-03-01 DIAGNOSIS — E1110 Type 2 diabetes mellitus with ketoacidosis without coma: Secondary | ICD-10-CM

## 2017-03-01 DIAGNOSIS — D899 Disorder involving the immune mechanism, unspecified: Secondary | ICD-10-CM

## 2017-03-01 LAB — MRSA PCR SCREENING: MRSA by PCR: NEGATIVE

## 2017-03-01 LAB — BASIC METABOLIC PANEL
ANION GAP: 11 (ref 5–15)
ANION GAP: 8 (ref 5–15)
ANION GAP: 8 (ref 5–15)
BUN: 31 mg/dL — ABNORMAL HIGH (ref 6–20)
BUN: 30 mg/dL — ABNORMAL HIGH (ref 6–20)
BUN: 26 mg/dL — ABNORMAL HIGH (ref 6–20)
CHLORIDE: 103 mmol/L (ref 101–111)
CHLORIDE: 105 mmol/L (ref 101–111)
CHLORIDE: 105 mmol/L (ref 101–111)
CO2: 17 mmol/L — AB (ref 22–32)
CO2: 20 mmol/L — AB (ref 22–32)
CO2: 20 mmol/L — ABNORMAL LOW (ref 22–32)
Calcium: 8.9 mg/dL (ref 8.9–10.3)
Calcium: 9.3 mg/dL (ref 8.9–10.3)
Calcium: 9.2 mg/dL (ref 8.9–10.3)
Creatinine, Ser: 2.06 mg/dL — ABNORMAL HIGH (ref 0.44–1.00)
Creatinine, Ser: 1.61 mg/dL — ABNORMAL HIGH (ref 0.44–1.00)
Creatinine, Ser: 1.4 mg/dL — ABNORMAL HIGH (ref 0.44–1.00)
GFR calc non Af Amer: 27 mL/min — ABNORMAL LOW (ref >60)
GFR calc non Af Amer: 36 mL/min — ABNORMAL LOW (ref >60)
GFR calc non Af Amer: 43 mL/min — ABNORMAL LOW (ref >60)
GFR, EST AFRICAN AMERICAN: 31 mL/min — AB (ref >60)
GFR, EST AFRICAN AMERICAN: 42 mL/min — AB (ref >60)
GFR, EST AFRICAN AMERICAN: 50 mL/min — AB (ref >60)
Glucose, Bld: 322 mg/dL — ABNORMAL HIGH (ref 65–99)
Glucose, Bld: 154 mg/dL — ABNORMAL HIGH (ref 65–99)
Glucose, Bld: 115 mg/dL — ABNORMAL HIGH (ref 65–99)
POTASSIUM: 3.8 mmol/L (ref 3.5–5.1)
Potassium: 4.1 mmol/L (ref 3.5–5.1)
Potassium: 3.7 mmol/L (ref 3.5–5.1)
SODIUM: 133 mmol/L — AB (ref 135–145)
Sodium: 131 mmol/L — ABNORMAL LOW (ref 135–145)
Sodium: 133 mmol/L — ABNORMAL LOW (ref 135–145)

## 2017-03-01 LAB — GLUCOSE, CAPILLARY
GLUCOSE-CAPILLARY: 110 mg/dL — AB (ref 65–99)
GLUCOSE-CAPILLARY: 111 mg/dL — AB (ref 65–99)
GLUCOSE-CAPILLARY: 111 mg/dL — AB (ref 65–99)
GLUCOSE-CAPILLARY: 119 mg/dL — AB (ref 65–99)
GLUCOSE-CAPILLARY: 144 mg/dL — AB (ref 65–99)
GLUCOSE-CAPILLARY: 186 mg/dL — AB (ref 65–99)
GLUCOSE-CAPILLARY: 253 mg/dL — AB (ref 65–99)
GLUCOSE-CAPILLARY: 452 mg/dL — AB (ref 65–99)
GLUCOSE-CAPILLARY: 570 mg/dL — AB (ref 65–99)
GLUCOSE-CAPILLARY: 95 mg/dL (ref 65–99)
Glucose-Capillary: 413 mg/dL — ABNORMAL HIGH (ref 65–99)
Glucose-Capillary: 324 mg/dL — ABNORMAL HIGH (ref 65–99)
Glucose-Capillary: 182 mg/dL — ABNORMAL HIGH (ref 65–99)
Glucose-Capillary: 128 mg/dL — ABNORMAL HIGH (ref 65–99)
Glucose-Capillary: 105 mg/dL — ABNORMAL HIGH (ref 65–99)
Glucose-Capillary: 99 mg/dL (ref 65–99)

## 2017-03-01 LAB — CBC WITH DIFFERENTIAL/PLATELET
Basophils Absolute: 0 10*3/uL (ref 0.0–0.1)
Basophils Relative: 0 %
Eosinophils Absolute: 0 10*3/uL (ref 0.0–0.7)
Eosinophils Relative: 0 %
HEMATOCRIT: 33.7 % — AB (ref 36.0–46.0)
Hemoglobin: 11.7 g/dL — ABNORMAL LOW (ref 12.0–15.0)
LYMPHS PCT: 16 %
Lymphs Abs: 0.7 10*3/uL (ref 0.7–4.0)
MCH: 28.5 pg (ref 26.0–34.0)
MCHC: 34.7 g/dL (ref 30.0–36.0)
MCV: 82.2 fL (ref 78.0–100.0)
MONO ABS: 0.1 10*3/uL (ref 0.1–1.0)
Monocytes Relative: 3 %
Neutro Abs: 3.4 10*3/uL (ref 1.7–7.7)
Neutrophils Relative %: 81 %
Platelets: 153 10*3/uL (ref 150–400)
RBC: 4.1 MIL/uL (ref 3.87–5.11)
RDW: 13 % (ref 11.5–15.5)
WBC: 4.2 10*3/uL (ref 4.0–10.5)

## 2017-03-01 LAB — TROPONIN I: Troponin I: <0.03 ng/mL (ref <0.03)

## 2017-03-01 MED ORDER — LEVOTHYROXINE SODIUM 137 MCG PO TABS: 137.0000 ug | ORAL_TABLET | Freq: Every day | ORAL | Status: DC

## 2017-03-01 MED ORDER — CARVEDILOL 25 MG PO TABS
50.0000 mg | ORAL_TABLET | Freq: Two times a day (BID) | ORAL | Status: DC
  Administered 2017-03-01 – 2017-03-03 (×5): 50 mg via ORAL
  Filled 2017-03-01 (×2): qty 4
  Filled 2017-03-01 (×5): qty 2
  Filled 2017-03-01: qty 4

## 2017-03-01 MED ORDER — ENOXAPARIN SODIUM 40 MG/0.4ML ~~LOC~~ SOLN
40.0000 mg | SUBCUTANEOUS | Status: DC
  Administered 2017-03-01 – 2017-03-03 (×3): 40 mg via SUBCUTANEOUS
  Filled 2017-03-01 (×3): qty 0.4

## 2017-03-01 MED ORDER — SODIUM CHLORIDE 0.9 % IV SOLN
INTRAVENOUS | Status: DC
  Administered 2017-03-01 – 2017-03-03 (×4): via INTRAVENOUS

## 2017-03-01 MED ORDER — SALINE SPRAY 0.65 % NA SOLN
1.0000 | NASAL | Status: DC | PRN
  Filled 2017-03-01: qty 44

## 2017-03-01 MED ORDER — ASPIRIN EC 81 MG PO TBEC
81.0000 mg | DELAYED_RELEASE_TABLET | Freq: Every day | ORAL | Status: DC | PRN
  Administered 2017-03-01: 81 mg via ORAL
  Filled 2017-03-01: qty 1

## 2017-03-01 MED ORDER — ACETAMINOPHEN 325 MG PO TABS
650.0000 mg | ORAL_TABLET | Freq: Four times a day (QID) | ORAL | Status: DC | PRN
  Administered 2017-03-01 – 2017-03-03 (×2): 650 mg via ORAL
  Filled 2017-03-01 (×2): qty 2

## 2017-03-01 MED ORDER — INSULIN REGULAR HUMAN 100 UNIT/ML IJ SOLN
INTRAMUSCULAR | Status: DC
  Filled 2017-03-01: qty 1

## 2017-03-01 MED ORDER — LEVOTHYROXINE SODIUM 25 MCG PO TABS
137.0000 ug | ORAL_TABLET | Freq: Every day | ORAL | Status: DC
  Administered 2017-03-01 – 2017-03-03 (×3): 137 ug via ORAL
  Filled 2017-03-01 (×4): qty 1

## 2017-03-01 MED ORDER — PREDNISONE 5 MG PO TABS
5.0000 mg | ORAL_TABLET | Freq: Every day | ORAL | Status: DC
  Administered 2017-03-01 – 2017-03-02 (×3): 5 mg via ORAL
  Filled 2017-03-01 (×3): qty 1

## 2017-03-01 MED ORDER — AMLODIPINE BESYLATE 10 MG PO TABS
10.0000 mg | ORAL_TABLET | Freq: Every day | ORAL | Status: DC
  Administered 2017-03-01 – 2017-03-03 (×3): 10 mg via ORAL
  Filled 2017-03-01 (×2): qty 1
  Filled 2017-03-01: qty 2

## 2017-03-01 MED ORDER — INSULIN ASPART 100 UNIT/ML ~~LOC~~ SOLN
0.0000 [IU] | Freq: Three times a day (TID) | SUBCUTANEOUS | Status: DC
  Administered 2017-03-02: 15 [IU] via SUBCUTANEOUS

## 2017-03-01 MED ORDER — INSULIN ASPART PROT & ASPART (70-30 MIX) 100 UNIT/ML ~~LOC~~ SUSP
35.0000 [IU] | Freq: Two times a day (BID) | SUBCUTANEOUS | Status: DC
  Administered 2017-03-01 – 2017-03-03 (×3): 35 [IU] via SUBCUTANEOUS
  Filled 2017-03-01: qty 10

## 2017-03-01 MED ORDER — HYDROCORTISONE NA SUCCINATE PF 100 MG IJ SOLR
50.0000 mg | Freq: Four times a day (QID) | INTRAMUSCULAR | Status: AC
  Administered 2017-03-01 (×2): 50 mg via INTRAVENOUS
  Filled 2017-03-01 (×2): qty 2

## 2017-03-01 MED ORDER — DEXTROSE-NACL 5-0.45 % IV SOLN
INTRAVENOUS | Status: DC
  Administered 2017-03-01 (×2): via INTRAVENOUS

## 2017-03-01 MED ORDER — SODIUM CHLORIDE 0.9 % IV SOLN: INTRAVENOUS | Status: DC

## 2017-03-01 MED ORDER — TACROLIMUS 0.5 MG PO CAPS
0.5000 mg | ORAL_CAPSULE | Freq: Every day | ORAL | Status: DC
  Administered 2017-03-01 – 2017-03-03 (×3): 0.5 mg via ORAL
  Filled 2017-03-01 (×3): qty 1

## 2017-03-01 MED ORDER — MYCOPHENOLATE MOFETIL 250 MG PO CAPS
500.0000 mg | ORAL_CAPSULE | Freq: Two times a day (BID) | ORAL | Status: DC
Start: 2017-03-01 — End: 2017-03-03
  Administered 2017-03-01 – 2017-03-03 (×5): 500 mg via ORAL
  Filled 2017-03-01 (×5): qty 2

## 2017-03-01 MED ORDER — INSULIN ASPART 100 UNIT/ML ~~LOC~~ SOLN: 0.0000 [IU] | Freq: Every day | SUBCUTANEOUS | Status: DC

## 2017-03-01 MED ORDER — FAMOTIDINE 20 MG PO TABS
20.0000 mg | ORAL_TABLET | Freq: Two times a day (BID) | ORAL | Status: DC
  Administered 2017-03-01 – 2017-03-03 (×6): 20 mg via ORAL
  Filled 2017-03-01 (×6): qty 1

## 2017-03-01 MED ORDER — TACROLIMUS 1 MG PO CAPS
1.0000 mg | ORAL_CAPSULE | Freq: Every day | ORAL | Status: DC
  Administered 2017-03-01 – 2017-03-02 (×3): 1 mg via ORAL
  Filled 2017-03-01 (×3): qty 1

## 2017-03-01 NOTE — Progress Notes (Signed)
Patient was admitted early this AM after midnight and H and P has been reviewed and I am in agreement with the current Assessment and Plan done by Dr. Toniann FailKakrakandy. 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 She was found to have a Blood Sugar of 766 and an Anion Gap of 19 and Bicarbonate of 14. Patient's Anion Gap is improving and now 11 and CO2 is 17. Will continue to follow BMP's and once patient's Bicarbonate is >20 and AG remains closed will transition to Long Acting Insulin. Patient will be changed to D5W 1/2 NS now that Blood Sugars are <250. Repeat BMP is pending. Patient was started on Stress dose Steroids and will continue those for 2 doses. Will check patient's Free T4 and add Acetaminophen for patient's Headache. Will continue to monitor patient's response to clinical interventions and repeat and follow blood work closely.

## 2017-03-01 NOTE — Progress Notes (Signed)
Inpatient Diabetes Program Recommendations  AACE/ADA: New Consensus Statement on Inpatient Glycemic Control (2015)  Target Ranges:  Prepandial:   less than 140 mg/dL      Peak postprandial:   less than 180 mg/dL (1-2 hours)      Critically ill patients:  140 - 180 mg/dL   Lab Results  Component Value Date   GLUCAP 99 03/01/2017   HGBA1C 10.4 (H) 01/27/2017    Review of Glycemic Control  Diabetes history: DM2 Outpatient Diabetes medications: 70/30 40 units bid, Novolog s/s 2-10 units tidwc Current orders for Inpatient glycemic control: IV insulin  Will be transitioning off insulin drip this afternoon.   For transition recommendations:  1. Start 70/30 Insulin- 32 units BID with meals tonight with supper (32 units BID would be 75% total home dose) Titrate up if CBGs > 180 mg/dL.  2. Give 1st dose 70/30 Insulin and then continue IV Insulin drip for 1 hour after 70/30 insulin given.  Then d/c IV Insulin drip.  3. Also recommend starting Novolog Moderate Correction Scale/ SSI (0-15 units) TID AC + HS  Spoke with pt at length regarding her diabetes control at home. States she sometimes forgets to take her insulin, mainly the ac dinner dose of 70/30. States she has had lots of stress within the past month with financial issues with her house payments. States she may have to look for a place to live. Has 50 year old son at home. Discussed ideas to remind herself to take her insulin, such as setting an alarm on her phone, asking son to help with reminding her. [facility]. Discussed her occasional hypoglycemia, s/s and treatment.   Will follow closely while in hospital.  Thank you. Ailene Ardshonda Tamas Suen, RD, LDN, CDE Inpatient Diabetes Coordinator [phone]

## 2017-03-01 NOTE — H&P (Signed)
History and Physical    [name] ZOX:[id number] DOB: 07/31/1966 DOA: 02/28/2017  PCP: Dois Davenport, MD  Patient coming from: Home.  Chief Complaint: Elevated blood sugar and weakness.  HPI: [name] is a 51 y.o. female with history of diabetes mellitus 2 on insulin, renal transplant on immunosuppressants, hypertension, chronic anemia presents to the ER with complaints of weakness and increased blood sugar. Patient states that she may have missed her long-acting insulin for last few days. Denies any chest pain or shortness of breath nausea vomiting abdominal pain or diarrhea.  ED Course: In the ER patient's blood sugar was found to be 766 with anion gap of 19 and bicarbonate of 14. UA shows ketones. On exam patient is not in distress. Patient was started on IV insulin infusion and 2 L of fluid was given as bolus in total for DKA and admitted for further management of diabetic ketoacidosis.  Review of Systems: As per HPI, rest all negative.   Past Medical History:  Diagnosis Date  . Arthritis    "elbows, knees, legs, back" (09/24/2015)  . CKD (chronic kidney disease)   . Daily headache   . Depression    "years ago"  . End stage renal disease (HCC)    right arm AV graft, post transplant  . Hypertension   . Hypothyroid   . 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 Kidney disease   . Pneumonia ~ 2007?  [name] Type II diabetes mellitus (HCC)    Insulin dependant    Past Surgical History:  Procedure Laterality Date  . AMPUTATION Left 05/11/2014   Procedure: AMPUTATION LEFT GREAT TOE;  Surgeon: Kathryne Hitch, MD;  Location: [facility];  Service: Orthopedics;  Laterality: Left;  . AV FISTULA PLACEMENT Right    forearm  . BACK SURGERY    . CATARACT EXTRACTION W/ INTRAOCULAR LENS  IMPLANT, BILATERAL Bilateral   . CESAREAN SECTION  07/1999  . DG AV DIALYSIS GRAFT DECLOT OR    . INCISION AND DRAINAGE Right 06/02/2015   Procedure: INCISION AND  DRAINAGE OF RIGHT 3rd RAY RESECTION;  Surgeon: Kathryne Hitch, MD;  Location: [facility];  Service: Orthopedics;  Laterality: Right;  . 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 LUMBAR DISC SURGERY  2001  . NEPHRECTOMY TRANSPLANTED ORGAN    . TOE AMPUTATION Right    1,2 & 3rd toes.  . TUBAL LIGATION  07/1999     reports that she quit smoking about 18 years ago. Her smoking use included Cigarettes. She has a 11.00 pack-year smoking history. She has never used smokeless tobacco. She reports that she does not drink alcohol or use drugs.  Allergies  Allergen Reactions  . Ace Inhibitors Cough  . Adhesive [Tape] Itching and Other (See Comments)    Please use paper tape  . Lisinopril Cough    Family History  Problem Relation Age of Onset  . Emphysema Mother   . Throat cancer Mother   . COPD Mother   . Cancer Mother   . Emphysema Father   . COPD Father   . Stroke Father   . ADD / ADHD Son     Prior to Admission medications   Medication Sig Start Date End Date Taking? Authorizing Provider  acetaminophen (TYLENOL) 325 MG tablet Take 2 tablets (650 mg total) by mouth every 6 (six) hours as needed for mild pain, moderate pain, fever or headache. 05/15/16  Yes Hongalgi, [employer]  D, MD  amLODipine (NORVASC) 10 MG tablet Take 1 tablet (10 mg total) by mouth daily. 08/14/16  Yes Regalado, Belkys A, MD  aspirin EC 81 MG tablet Take 81 mg by mouth daily as needed for mild pain.    Yes [provider]  carvedilol (COREG) 25 MG tablet Take 50 mg by mouth 2 (two) times daily. 02/26/17  Yes [provider]  insulin aspart (NOVOLOG) 100 UNIT/ML injection Before each meal 3 times a day, 140-199 - 2 units, 200-250 - 4 units, 251-299 - 6 units,  300-349 - 8 units,  350 or above 10 units. Insulin PEN if approved, provide syringes and needles if needed. 01/02/17  Yes Leroy SeaSingh, Prashant K, MD  insulin NPH-regular Human (NOVOLIN 70/30) (70-30) 100 UNIT/ML injection Inject 42 Units into the skin 2  (two) times daily with a meal. Start 5pm tonight Patient taking differently: Inject 40 Units into the skin 2 (two) times daily with a meal. Start 5pm tonight 01/02/17  Yes Leroy SeaSingh, Prashant K, MD  levothyroxine (SYNTHROID, LEVOTHROID) 137 MCG tablet Take 1 tablet (137 mcg total) by mouth daily. BRAND NAME ONLY 03/04/15  Yes Alison Murrayevine, Alma M, MD  mycophenolate (CELLCEPT) 250 MG capsule Take 500 mg by mouth 2 (two) times daily.    Yes [provider]  predniSONE (DELTASONE) 5 MG tablet Take 5 mg by mouth at bedtime.  05/29/15  Yes [provider]  ranitidine (ZANTAC) 150 MG tablet Take 150 mg by mouth at bedtime.  03/27/16  Yes [provider]  tacrolimus (PROGRAF) 0.5 MG capsule Take 0.5-1 mg by mouth 2 (two) times daily. Take 0.5mg  by mouth in the morning and 1mg  by mouth at bedtime   Yes [provider]  carvedilol (COREG) 6.25 MG tablet Take 2 tablets (12.5 mg total) by mouth 2 (two) times daily. Patient not taking: Reported on 02/28/2017 10/09/16   Elease EtienneHongalgi, Anand D, MD    Physical Exam: Vitals:   02/28/17 2133 02/28/17 2315  BP: (!) 119/58   Pulse: 77   Resp: 17   Temp: 97.7 F (36.5 C)   TempSrc: Oral   SpO2: 95%   Weight:  78.5 kg (173 lb)  Height:  5\' 9"  (1.753 m)      Constitutional: Moderately built and nourished. Vitals:   02/28/17 2133 02/28/17 2315  BP: (!) 119/58   Pulse: 77   Resp: 17   Temp: 97.7 F (36.5 C)   TempSrc: Oral   SpO2: 95%   Weight:  78.5 kg (173 lb)  Height:  5\' 9"  (1.753 m)   Eyes: Anicteric no pallor. ENMT: No discharge from the ears eyes nose and mouth. Neck: No mass felt. No neck rigidity. Respiratory: No rhonchi or crepitations. Cardiovascular: S1-S2 heard no murmurs appreciated. Abdomen: Soft nontender bowel sounds present. No guarding or rigidity. Musculoskeletal: No edema. No joint effusion. Skin: No rash. Skin appears warm. Neurologic: Alert awake oriented to time place and person. Moves all extremities.  Has some memory issues. Psychiatric: Has some memory issues with recent recall.   Labs on Admission: I have personally reviewed following labs and imaging studies  CBC:  Recent Labs Lab 02/28/17 2146  WBC 5.2  HGB 11.3*  HCT 35.6*  MCV 90.1  PLT 171   Basic Metabolic Panel:  Recent Labs Lab 02/28/17 2146  NA 127*  K 4.7  CL 94*  CO2 14*  GLUCOSE 766*  BUN 36*  CREATININE 2.32*  CALCIUM 8.9   GFR: Estimated Creatinine  Clearance: 30.3 mL/min (A) (by C-G formula based on SCr of 2.32 mg/dL (H)). Liver Function Tests: No results for input(s): AST, ALT, ALKPHOS, BILITOT, PROT, ALBUMIN in the last 168 hours. No results for input(s): LIPASE, AMYLASE in the last 168 hours. No results for input(s): AMMONIA in the last 168 hours. Coagulation Profile: No results for input(s): INR, PROTIME in the last 168 hours. Cardiac Enzymes: No results for input(s): CKTOTAL, CKMB, CKMBINDEX, TROPONINI in the last 168 hours. BNP (last 3 results) No results for input(s): PROBNP in the last 8760 hours. HbA1C: No results for input(s): HGBA1C in the last 72 hours. CBG:  Recent Labs Lab 02/28/17 2135  GLUCAP >600*   Lipid Profile: No results for input(s): CHOL, HDL, LDLCALC, TRIG, CHOLHDL, LDLDIRECT in the last 72 hours. Thyroid Function Tests: No results for input(s): TSH, T4TOTAL, FREET4, T3FREE, THYROIDAB in the last 72 hours. Anemia Panel: No results for input(s): VITAMINB12, FOLATE, FERRITIN, TIBC, IRON, RETICCTPCT in the last 72 hours. Urine analysis:    Component Value Date/Time   COLORURINE STRAW (A) 02/28/2017 2141   APPEARANCEUR CLEAR 02/28/2017 2141   LABSPEC 1.018 02/28/2017 2141   PHURINE 5.0 02/28/2017 2141   GLUCOSEU >=500 (A) 02/28/2017 2141   HGBUR SMALL (A) 02/28/2017 2141   BILIRUBINUR NEGATIVE 02/28/2017 2141   KETONESUR 80 (A) 02/28/2017 2141   PROTEINUR 100 (A) 02/28/2017 2141   UROBILINOGEN 0.2 03/02/2015 1153   NITRITE NEGATIVE 02/28/2017 2141    LEUKOCYTESUR NEGATIVE 02/28/2017 2141   Sepsis Labs: @LABRCNTIP (procalcitonin:4,lacticidven:4) )No results found for this or any previous visit (from the past 240 hour(s)).   Radiological Exams on Admission: No results found.   Assessment/Plan Principal Problem:   DKA (diabetic ketoacidoses) (HCC) Active Problems:   DKA, type 1 (HCC)   Immunosuppression (HCC)   Hypothyroidism   Essential hypertension   Acute renal failure superimposed on stage 3 chronic kidney disease (HCC)   History of renal transplantation    1. Diabetic ketoacidosis in type 2 diabetes mellitus - probably secondary to patient missing her long-acting insulin. Patient is placed on IV insulin infusion and fluids. Follow metabolic panel and once anion gap is corrected change to long-acting insulin. Follow intake output. 2. Acute renal failure on chronic kidney disease stage III on renal transplant - probably secondary to diabetic ketoacidosis and dehydration. Continue with aggressive hydration and follow metabolic panel. Follow intake output. Continue immunosuppressants. Since patient is on steroids I have placed patient on hydrocortisone 50 mg IV every 6 for 2 doses. Further doses to be decided. 3. Hypertension on Coreg and amlodipine. 4. Hypothyroidism on Synthroid. 5. Chronic anemia probably from renal disease - hemoglobin appears to be at baseline.  EKG and cardiac markers are pending.  Patient probably has some memory issues which has to be addressed as outpatient.   DVT prophylaxis: Lovenox. Code Status: Full code.  Family Communication: Discussed with patient.  Disposition Plan: Home.  Consults called: None.  Admission status: Inpatient.    Eduard Clos MD [facility] Pager [phone].  If 7PM-7AM, please contact night-coverage [email or web address] Password [email or web address]  03/01/2017, 12:08 AM

## 2017-03-02 DIAGNOSIS — E1010 Type 1 diabetes mellitus with ketoacidosis without coma: Principal | ICD-10-CM

## 2017-03-02 LAB — GLUCOSE, CAPILLARY
GLUCOSE-CAPILLARY: 211 mg/dL — AB (ref 65–99)
Glucose-Capillary: 430 mg/dL — ABNORMAL HIGH (ref 65–99)
Glucose-Capillary: 76 mg/dL (ref 65–99)

## 2017-03-02 LAB — CBC WITH DIFFERENTIAL/PLATELET
BASOS ABS: 0 10*3/uL (ref 0.0–0.1)
Basophils Relative: 0 %
EOS PCT: 1 %
Eosinophils Absolute: 0 10*3/uL (ref 0.0–0.7)
HEMATOCRIT: 36.1 % (ref 36.0–46.0)
Hemoglobin: 12.2 g/dL (ref 12.0–15.0)
LYMPHS ABS: 0.7 10*3/uL (ref 0.7–4.0)
LYMPHS PCT: 15 %
MCH: 27.8 pg (ref 26.0–34.0)
MCHC: 33.8 g/dL (ref 30.0–36.0)
MCV: 82.2 fL (ref 78.0–100.0)
MONO ABS: 0.2 10*3/uL (ref 0.1–1.0)
MONOS PCT: 5 %
NEUTROS ABS: 3.5 10*3/uL (ref 1.7–7.7)
Neutrophils Relative %: 79 %
Platelets: 161 10*3/uL (ref 150–400)
RBC: 4.39 MIL/uL (ref 3.87–5.11)
RDW: 13.3 % (ref 11.5–15.5)
WBC: 4.4 10*3/uL (ref 4.0–10.5)

## 2017-03-02 LAB — COMPREHENSIVE METABOLIC PANEL
ALT: 10 U/L — AB (ref 14–54)
AST: 16 U/L (ref 15–41)
Albumin: 2.7 g/dL — ABNORMAL LOW (ref 3.5–5.0)
Alkaline Phosphatase: 103 U/L (ref 38–126)
Anion gap: 7 (ref 5–15)
BILIRUBIN TOTAL: 0.6 mg/dL (ref 0.3–1.2)
BUN: 19 mg/dL (ref 6–20)
CHLORIDE: 106 mmol/L (ref 101–111)
CO2: 23 mmol/L (ref 22–32)
CREATININE: 1.25 mg/dL — AB (ref 0.44–1.00)
Calcium: 9.3 mg/dL (ref 8.9–10.3)
GFR calc Af Amer: 57 mL/min — ABNORMAL LOW (ref >60)
GFR calc non Af Amer: 49 mL/min — ABNORMAL LOW (ref >60)
Glucose, Bld: 105 mg/dL — ABNORMAL HIGH (ref 65–99)
POTASSIUM: 3.8 mmol/L (ref 3.5–5.1)
Sodium: 136 mmol/L (ref 135–145)
TOTAL PROTEIN: 6.4 g/dL — AB (ref 6.5–8.1)

## 2017-03-02 LAB — GLUCOSE, RANDOM: GLUCOSE: 412 mg/dL — AB (ref 65–99)

## 2017-03-02 LAB — PHOSPHORUS: Phosphorus: 2.8 mg/dL (ref 2.5–4.6)

## 2017-03-02 LAB — MAGNESIUM: Magnesium: 1.3 mg/dL — ABNORMAL LOW (ref 1.7–2.4)

## 2017-03-02 MED ORDER — INSULIN ASPART 100 UNIT/ML ~~LOC~~ SOLN
0.0000 [IU] | Freq: Three times a day (TID) | SUBCUTANEOUS | Status: DC
  Administered 2017-03-02: 7 [IU] via SUBCUTANEOUS

## 2017-03-02 MED ORDER — MAGNESIUM SULFATE 2 GM/50ML IV SOLN
2.0000 g | Freq: Once | INTRAVENOUS | Status: AC
  Administered 2017-03-02: 2 g via INTRAVENOUS
  Filled 2017-03-02: qty 50

## 2017-03-02 MED ORDER — INSULIN ASPART 100 UNIT/ML ~~LOC~~ SOLN: 0.0000 [IU] | Freq: Every day | SUBCUTANEOUS | Status: DC

## 2017-03-02 MED ORDER — INSULIN ASPART 100 UNIT/ML ~~LOC~~ SOLN
0.0000 [IU] | Freq: Three times a day (TID) | SUBCUTANEOUS | Status: DC
  Administered 2017-03-03: 3 [IU] via SUBCUTANEOUS
  Administered 2017-03-03: 2 [IU] via SUBCUTANEOUS

## 2017-03-02 MED ORDER — INSULIN ASPART 100 UNIT/ML ~~LOC~~ SOLN
6.0000 [IU] | Freq: Once | SUBCUTANEOUS | Status: AC
  Administered 2017-03-02: 6 [IU] via SUBCUTANEOUS

## 2017-03-02 NOTE — Progress Notes (Signed)
Inpatient Diabetes Program Recommendations  AACE/ADA: New Consensus Statement on Inpatient Glycemic Control (2015)  Target Ranges:  Prepandial:   less than 140 mg/dL      Peak postprandial:   less than 180 mg/dL (1-2 hours)      Critically ill patients:  140 - 180 mg/dL   Lab Results  Component Value Date   GLUCAP 430 (H) 03/02/2017   HGBA1C 10.4 (H) 01/27/2017    Review of Glycemic Control  Blood sugars in > 400s. Needs insulin adjustment.  Inpatient Diabetes Program Recommendations:    Increase 70/30 to 38 units bid Would decrease Novolog to 0-15 units tidwc and hs since pt is Type 1 and very sensitive to insulin.  ? Whether pt is eating "extra" food?  Will continue to follow.  Thank you. [name], RD, LDN, CDE Inpatient Diabetes Coordinator [phone]

## 2017-03-02 NOTE — Care Management Note (Signed)
Case Management Note  Patient Details  Name: [name] MRN: [id number] Date of Birth: [birth year]  Subjective/Objective:  [age] y/o f admitted w/DKA. From home.                  Action/Plan:d/c home.   Expected Discharge Date:                  Expected Discharge Plan:  Home/Self Care  In-House Referral:     Discharge planning Services  CM Consult  Post Acute Care Choice:    Choice offered to:     DME Arranged:    DME Agency:     HH Arranged:    HH Agency:     Status of Service:  In process, will continue to follow  If discussed at Long Length of Stay Meetings, dates discussed:    Additional Comments:  Lanier ClamMahabir, Jaekwon Mcclune, RN 03/02/2017, 11:33 AM

## 2017-03-02 NOTE — Progress Notes (Signed)
PROGRESS NOTE    [name]  ZOX:[id number] DOB: 10-06-1965 DOA: 02/28/2017 PCP: Dois Davenportichter, Karen L, MD   Brief Narrative:  Patient is a 51 yo female with a PMH of HTN, Hypothyroidism, CKD 2/2 to Right Renal Transplant on Immunosuppressants, Insulin Dependent Diabetes Mellitus Type 2, Chronic Anemia, Depression and other comorbidities who [facility] with generalized weakness and elevated Blood sugars after missing several doses of her Long-Acting Insulin (70/30) and Novolog due to stressors and forgetfulness. She was found to have a Blood Sugar of 766 and an Anion Gap of 19 and Bicarbonate of 14  And admitted for DKA. Patient's Anion Gap is improving and now 8 and CO2 is 20. She was transitioned to Long acting insulin yesterday and allowed to Eat. The Diabetic Education Coordinator was Consulted for further reccs. She is improving but BS have been labile.  Assessment & Plan:   Principal Problem:   DKA (diabetic ketoacidoses) (HCC) Active Problems:   DKA, type 1 (HCC)   Immunosuppression (HCC)   Hypothyroidism   Essential hypertension   Acute renal failure superimposed on stage 3 chronic kidney disease (HCC)   History of renal transplantation  Diabetic ketoacidosis in Type 2 Diabetes Mellitus, improved -Secondary to patient missing her long-acting insulin doses.  -Patient was placed on IV insulin infusion and fluids.  -Metabolic panel was followed and Anion Gap closed so she changed to long-acting insulin. And is on Novolg 70/30 and on 35 Units BID -Per Diabetic Education Coordinator Changed Resistent Novolog SSI to Sensitive  -CBG's ranging from 76-430 -CMP is Pending from Today  Hypomagnesemia -Mag Level was 1.3 -Replete with IV Mag Sulfate 2 grams -Continue to Monitor Mag Level and Replete as Necessary -Repeat Mag Level in AM  Acute renal failure on chronic kidney disease stage III From Renal transplant  -Probably secondary to diabetic ketoacidosis and  dehydration. -Patient's BUN/Cr went from 36/2.32 on Admission to 26/1.40 -CMP from today is Pending -Continued with aggressive hydration and follow metabolic panel. Now on NS at 50 mL/hr -Continue immunosuppressants with Mycophenolate 500 po BID, Tacrolimis 0.5 mg po Daily and 1 mg po qHS -Since patient was on steroids she was placed on Hydrocortisone 50 mg IV every 6 for 2 doses. C/w po Prednisone 1 mg po Daily   Hypertension  -C/w Coreg and amlodipine.  Hypothyroidism  -C/w Synthroid.  Chronic Anemia probably from Renal Disease  -Hemoglobin appears to be at baseline. -Hb/Hct was 11.7/33.7 -> 12.2/36.1 -Continue to Monitor CBC's  DVT prophylaxis: Enoxaparin 40 mg sq q24h Code Status: FULL CODE Family Communication: No family present at bedside Disposition Plan: Remain Inpatient and possible D/C in AM  Consultants:   Diabetic Education Coordinator   Procedures: None   Antimicrobials:  Anti-infectives    None     Subjective: Seen and examined and was doing better. States BS shot up. No nausea or vomiting. Denied any other complaints or concerns at this time.   Objective: Vitals:   03/01/17 2346 03/02/17 0500 03/02/17 1015 03/02/17 1438  BP: (!) 165/71 (!) 166/75 139/81 (!) 154/73  Pulse: 70 69 79 76  Resp: 15 17 18 18   Temp: 97.5 F (36.4 C) 97.6 F (36.4 C) 97.7 F (36.5 C) 97.8 F (36.6 C)  TempSrc: Oral Axillary Oral Oral  SpO2: 90% 98% 98% 98%  Weight:      Height:        Intake/Output Summary (Last 24 hours) at 03/02/17 1751 Last data filed at 03/02/17 1500  Gross per 24 hour  Intake             3150 ml  Output              400 ml  Net             2750 ml   Filed Weights   02/28/17 2315  Weight: 78.5 kg (173 lb)   Examination: Physical Exam:  Constitutional: NAD and appears calm and comfortable Eyes: Lids and conjunctivae normal, sclerae anicteric  ENMT: External Ears, Nose appear normal. Grossly normal hearing. Mucous membranes are moist.  Poor Dentition Neck: Appears normal, supple, no cervical masses, normal ROM, no appreciable thyromegaly, no JVD Respiratory: Diminished to auscultation bilaterally, no wheezing, rales, rhonchi or crackles. Normal respiratory effort and patient is not tachypenic. No accessory muscle use.  Cardiovascular: RRR, no murmurs / rubs / gallops. S1 and S2 auscultated.   Abdomen: Soft, non-tender, non-distended. No masses palpated. No appreciable hepatosplenomegaly. Bowel sounds positive x4.  GU: Deferred. Musculoskeletal: No clubbing / cyanosis of digits/nails. No joint deformity upper and lower extremities.  Skin: No rashes, lesions, ulcers. No induration; Warm and dry.  Neurologic: CN 2-12 grossly intact with no focal deficits. Romberg sign cerebellar reflexes not assessed.  Psychiatric: Normal judgment and insight. Alert and oriented x 3. Normal mood and appropriate affect.   Data Reviewed: I have personally reviewed following labs and imaging studies  CBC:  Recent Labs Lab 02/28/17 2146 03/01/17 0933 03/02/17 0823  WBC 5.2 4.2 4.4  NEUTROABS  --  3.4 3.5  HGB 11.3* 11.7* 12.2  HCT 35.6* 33.7* 36.1  MCV 90.1 82.2 82.2  PLT 171 153 161   Basic Metabolic Panel:  Recent Labs Lab 02/28/17 2146 03/01/17 0403 03/01/17 0804 03/01/17 1316 03/02/17 0823  NA 127* 131* 133* 133*  --   K 4.7 4.1 3.7 3.8  --   CL 94* 103 105 105  --   CO2 14* 17* 20* 20*  --   GLUCOSE 766* 322* 154* 115* 412*  BUN 36* 31* 30* 26*  --   CREATININE 2.32* 2.06* 1.61* 1.40*  --   CALCIUM 8.9 8.9 9.3 9.2  --   MG  --   --   --   --  1.3*  PHOS  --   --   --   --  2.8   GFR: Estimated Creatinine Clearance: 50.2 mL/min (A) (by C-G formula based on SCr of 1.4 mg/dL (H)). Liver Function Tests: No results for input(s): AST, ALT, ALKPHOS, BILITOT, PROT, ALBUMIN in the last 168 hours. No results for input(s): LIPASE, AMYLASE in the last 168 hours. No results for input(s): AMMONIA in the last 168  hours. Coagulation Profile: No results for input(s): INR, PROTIME in the last 168 hours. Cardiac Enzymes:  Recent Labs Lab 03/01/17 0804  TROPONINI <0.03   BNP (last 3 results) No results for input(s): PROBNP in the last 8760 hours. HbA1C: No results for input(s): HGBA1C in the last 72 hours. CBG:  Recent Labs Lab 03/01/17 1646 03/01/17 2107 03/02/17 0744 03/02/17 1142 03/02/17 1633  GLUCAP 119* 95 430* 211* 76   Lipid Profile: No results for input(s): CHOL, HDL, LDLCALC, TRIG, CHOLHDL, LDLDIRECT in the last 72 hours. Thyroid Function Tests: No results for input(s): TSH, T4TOTAL, FREET4, T3FREE, THYROIDAB in the last 72 hours. Anemia Panel: No results for input(s): VITAMINB12, FOLATE, FERRITIN, TIBC, IRON, RETICCTPCT in the last 72 hours. Sepsis Labs: No results for input(s): PROCALCITON, LATICACIDVEN in  the last 168 hours.  Recent Results (from the past 240 hour(s))  MRSA PCR Screening     Status: None   Collection Time: 03/01/17  1:00 AM  Result Value Ref Range Status   MRSA by PCR NEGATIVE NEGATIVE Final    Comment:        The GeneXpert MRSA Assay (FDA approved for NASAL specimens only), is one component of a comprehensive MRSA colonization surveillance program. It is not intended to diagnose MRSA infection nor to guide or monitor treatment for MRSA infections.     Radiology Studies: Dg Chest Port 1 View  Result Date: 03/01/2017 CLINICAL DATA:  DKA, hypertension, diabetes mellitus EXAM: PORTABLE CHEST 1 VIEW COMPARISON:  Portable exam 0721 hours compared to 01/26/2017 FINDINGS: Normal heart size, mediastinal contours, and pulmonary vascularity. Atherosclerotic calcification aorta. Lungs clear. No pleural effusion or pneumothorax. Bones unremarkable. IMPRESSION: No acute abnormalities. Aortic Atherosclerosis (ICD10-I70.0). Electronically Signed   By: Ulyses Southward M.D.   On: 03/01/2017 07:36   Scheduled Meds: . amLODipine  10 mg Oral Daily  . carvedilol  50  mg Oral BID  . enoxaparin (LOVENOX) injection  40 mg Subcutaneous Q24H  . famotidine  20 mg Oral BID  . insulin aspart  0-20 Units Subcutaneous TID WC  . insulin aspart  0-5 Units Subcutaneous QHS  . insulin aspart protamine- aspart  35 Units Subcutaneous BID WC  . levothyroxine  137 mcg Oral QAC breakfast  . mycophenolate  500 mg Oral BID  . predniSONE  5 mg Oral QHS  . tacrolimus  0.5 mg Oral Daily  . tacrolimus  1 mg Oral QHS   Continuous Infusions: . sodium chloride 75 mL/hr at 03/02/17 1229  . magnesium sulfate 1 - 4 g bolus IVPB      LOS: 2 days   Merlene Laughter, DO [facility] Pager [phone]  If 7PM-7AM, please contact night-coverage [email or web address] Password [facility] 03/02/2017, 5:51 PM

## 2017-03-03 DIAGNOSIS — I10 Essential (primary) hypertension: Secondary | ICD-10-CM

## 2017-03-03 DIAGNOSIS — Z940 Kidney transplant status: Secondary | ICD-10-CM

## 2017-03-03 DIAGNOSIS — N183 Chronic kidney disease, stage 3 (moderate): Secondary | ICD-10-CM

## 2017-03-03 DIAGNOSIS — N179 Acute kidney failure, unspecified: Secondary | ICD-10-CM

## 2017-03-03 DIAGNOSIS — E038 Other specified hypothyroidism: Secondary | ICD-10-CM

## 2017-03-03 DIAGNOSIS — E0810 Diabetes mellitus due to underlying condition with ketoacidosis without coma: Secondary | ICD-10-CM

## 2017-03-03 LAB — COMPREHENSIVE METABOLIC PANEL
ALBUMIN: 2.5 g/dL — AB (ref 3.5–5.0)
ALT: 10 U/L — ABNORMAL LOW (ref 14–54)
ANION GAP: 7 (ref 5–15)
AST: 22 U/L (ref 15–41)
Alkaline Phosphatase: 102 U/L (ref 38–126)
BILIRUBIN TOTAL: 0.5 mg/dL (ref 0.3–1.2)
BUN: 17 mg/dL (ref 6–20)
CHLORIDE: 106 mmol/L (ref 101–111)
CO2: 25 mmol/L (ref 22–32)
Calcium: 9.4 mg/dL (ref 8.9–10.3)
Creatinine, Ser: 1.19 mg/dL — ABNORMAL HIGH (ref 0.44–1.00)
GFR calc Af Amer: >60 mL/min (ref >60)
GFR, EST NON AFRICAN AMERICAN: 52 mL/min — AB (ref >60)
Glucose, Bld: 185 mg/dL — ABNORMAL HIGH (ref 65–99)
POTASSIUM: 3.5 mmol/L (ref 3.5–5.1)
Sodium: 138 mmol/L (ref 135–145)
TOTAL PROTEIN: 6 g/dL — AB (ref 6.5–8.1)

## 2017-03-03 LAB — GLUCOSE, CAPILLARY
GLUCOSE-CAPILLARY: 204 mg/dL — AB (ref 65–99)
Glucose-Capillary: 134 mg/dL — ABNORMAL HIGH (ref 65–99)
Glucose-Capillary: 194 mg/dL — ABNORMAL HIGH (ref 65–99)

## 2017-03-03 LAB — MAGNESIUM: Magnesium: 1.7 mg/dL (ref 1.7–2.4)

## 2017-03-03 MED ORDER — INSULIN NPH ISOPHANE & REGULAR (70-30) 100 UNIT/ML ~~LOC~~ SUSP: 40.0000 [IU] | Freq: Two times a day (BID) | SUBCUTANEOUS | Status: DC

## 2017-03-03 MED ORDER — INSULIN ASPART PROT & ASPART (70-30 MIX) 100 UNIT/ML ~~LOC~~ SUSP: 38.0000 [IU] | Freq: Two times a day (BID) | SUBCUTANEOUS | Status: DC

## 2017-03-03 NOTE — Discharge Summary (Addendum)
Physician Discharge Summary  [name] UUV:[id number] DOB: December 25, 1965  PCP: Dois Davenportichter, Karen L, MD  Admit date: 02/28/2017 Discharge date: 03/03/2017  Recommendations for Outpatient Follow-up:  1. Dr. Nadyne CoombesKaren Richter, PCPIn 3 days with repeat labs (CBC & BMP).  Home Health: None Equipment/Devices: None    Discharge Condition: Improved and stable  CODE STATUS: Full  Diet recommendation: Heart healthy and diabetic diet.  Discharge Diagnoses:  Principal Problem:   DKA (diabetic ketoacidoses) (HCC) Active Problems:   DKA, type 1 (HCC)   Immunosuppression (HCC)   Hypothyroidism   Essential hypertension   Acute renal failure superimposed on stage 3 chronic kidney disease (HCC)   History of renal transplantation   Brief Summary: 51 year old female with PMH of poorly controlled type II DM/IDDM, renal transplant on immunosuppressants, chronic kidney disease, HTN, hypothyroid, medical noncompliance, frequent hospitalizations, presented to ED with generalized weakness and elevated CBGs after missing several doses of her home insulin (70/30 insulin. She and spouse confirms that she does not use SSI). She was noted to be in DKA (blood sugar 766, bicarbonate 14, anion gap 19). Admitted to stepdown unit for further management.  Assessment and plan:  1. DKA in type II DM, not at goal: Precipitated by noncompliance to her home insulin's-patient cannot give a clear reason but stated that she had many things going on in her life and forgot to take her insulin's. Counseled extensively regarding compliance with all aspects of diabetes care. Confirmed with her and her spouse that she only takes 70/30 insulin and does not use SSI. Reports that her home CBGs are usually in the 350-375 mg/dL. Reports occasional "highs"and rare hypoglycemic events. Reports not fully compliant with her diabetic diet. Admitted to stepdown and treated per DKA protocol with aggressive IV fluids, IV insulin infusion and frequent  BMP monitoring. DKA resolved. She was transitioned to lower than home dose of 70/30 insulin's. CBGs are mildly uncontrolled. She will be discharged on her home dose of 70/30 insulin. She reports that [facility] planned endocrinology consultation for possible insulin pump. Diabetes coordinator was also consulted. Hemoglobin A1c 01/27/17:10.4. 2. Acute kidney injury complicating stage III chronic kidney disease in patient with renal transplant: Secondary to dehydration and DKA. Creatinine has normalized. Continue home immunosuppressants. 3. Hypomagnesemia: Replaced. 4. Essential hypertension: Mildly uncontrolled. Continue home dose of carvedilol and amlodipine. 5. Hypothyroid: Clinically euthyroid. Continue levothyroxine. 6. Anemia: Stable. 7. GERD: Stable. Continue H2 blockers.   Consultations:  None  Procedures:  None   Discharge Instructions  Discharge Instructions    Call MD for:  extreme fatigue    Complete by:  As directed    Call MD for:  persistant dizziness or light-headedness    Complete by:  As directed    Diet - low sodium heart healthy    Complete by:  As directed    Diet Carb Modified    Complete by:  As directed    Increase activity slowly    Complete by:  As directed        Medication List    STOP taking these medications   aspirin EC 81 MG tablet   insulin aspart 100 UNIT/ML injection Commonly known as:  NOVOLOG     TAKE these medications   acetaminophen 325 MG tablet Commonly known as:  TYLENOL Take 2 tablets (650 mg total) by mouth every 6 (six) hours as needed for mild pain, moderate pain, fever or headache.   amLODipine  10 MG tablet Commonly known as:  NORVASC Take 1 tablet (10 mg total) by mouth daily.   carvedilol 25 MG tablet Commonly known as:  COREG Take 50 mg by mouth 2 (two) times daily. What changed:  Another medication with the same name was removed. Continue taking this medication, and follow  the directions you see here.   insulin NPH-regular Human (70-30) 100 UNIT/ML injection Commonly known as:  NOVOLIN 70/30 Inject 40 Units into the skin 2 (two) times daily with a meal. What changed:  how much to take  additional instructions   levothyroxine 137 MCG tablet Commonly known as:  SYNTHROID, LEVOTHROID Take 1 tablet (137 mcg total) by mouth daily. BRAND NAME ONLY   mycophenolate 250 MG capsule Commonly known as:  CELLCEPT Take 500 mg by mouth 2 (two) times daily.   predniSONE 5 MG tablet Commonly known as:  DELTASONE Take 5 mg by mouth at bedtime.   ranitidine 150 MG tablet Commonly known as:  ZANTAC Take 150 mg by mouth at bedtime.   tacrolimus 0.5 MG capsule Commonly known as:  PROGRAF Take 0.5-1 mg by mouth 2 (two) times daily. Take 0.5mg  by mouth in the morning and 1mg  by mouth at bedtime      Follow-up Information    Dois Davenport, MD. Schedule an appointment as soon as possible for a visit in 3 day(s).   Specialty:  Family Medicine Why:  To be seen with repeat labs (CBC & BMP). Also follow-up regarding diabetes management. Contact information: [address] [place] [address] [phone]          Allergies  Allergen Reactions  . Ace Inhibitors Cough  . Adhesive [Tape] Itching and Other (See Comments)    Please use paper tape  . Lisinopril Cough      Procedures/Studies: Dg Chest Port 1 View  Result Date: 03/01/2017 CLINICAL DATA:  DKA, hypertension, diabetes mellitus EXAM: PORTABLE CHEST 1 VIEW COMPARISON:  Portable exam 0721 hours compared to 01/26/2017 FINDINGS: Normal heart size, mediastinal contours, and pulmonary vascularity. Atherosclerotic calcification aorta. Lungs clear. No pleural effusion or pneumothorax. Bones unremarkable. IMPRESSION: No acute abnormalities. Aortic Atherosclerosis (ICD10-I70.0). Electronically Signed   By: Ulyses Southward M.D.   On: 03/01/2017 07:36      Subjective: Feels slightly weak at times but  improved compared to an admission. Ambulating comfortably in the room without dizziness, lightheadedness, dyspnea, chest pain.  Discharge Exam:  Vitals:   03/02/17 1438 03/02/17 2100 03/03/17 0400 03/03/17 1315  BP: (!) 154/73 (!) 147/78 (!) 149/77 (!) 152/69  Pulse: 76 71 67 73  Resp: 18 18 18 18   Temp: 97.8 F (36.6 C) 97.9 F (36.6 C) 97.6 F (36.4 C) 97.8 F (36.6 C)  TempSrc: Oral Oral Oral Oral  SpO2: 98% 100% 95% 100%  Weight:      Height:        General: Pt lying comfortably in bed & appears in no obvious distress. Oral mucosa moist. Cardiovascular: S1 & S2 heard, RRR, S1/S2 +. No murmurs, rubs, gallops or clicks. No JVD or pedal edema. Telemetry: Sinus rhythm. Respiratory: Clear to auscultation without wheezing, rhonchi or crackles. No increased work of breathing. Abdominal:  Non distended, non tender & soft. No organomegaly or masses appreciated. Normal bowel sounds heard. CNS: Alert and oriented. No focal deficits. Extremities: no edema, no cyanosis    The results of significant diagnostics from this hospitalization (including imaging, microbiology, ancillary and laboratory) are listed below for reference.  Microbiology: Recent Results (from the past 240 hour(s))  MRSA PCR Screening     Status: None   Collection Time: 03/01/17  1:00 AM  Result Value Ref Range Status   MRSA by PCR NEGATIVE NEGATIVE Final    Comment:        The GeneXpert MRSA Assay (FDA approved for NASAL specimens only), is one component of a comprehensive MRSA colonization surveillance program. It is not intended to diagnose MRSA infection nor to guide or monitor treatment for MRSA infections.      Labs: CBC:  Recent Labs Lab 02/28/17 2146 03/01/17 0933 03/02/17 0823  WBC 5.2 4.2 4.4  NEUTROABS  --  3.4 3.5  HGB 11.3* 11.7* 12.2  HCT 35.6* 33.7* 36.1  MCV 90.1 82.2 82.2  PLT 171 153 161   Basic Metabolic Panel:  Recent Labs Lab 03/01/17 0403 03/01/17 0804  03/01/17 1316 03/02/17 0823 03/02/17 1907 03/03/17 0638 03/03/17 0716  NA 131* 133* 133*  --  136 138  --   K 4.1 3.7 3.8  --  3.8 3.5  --   CL 103 105 105  --  106 106  --   CO2 17* 20* 20*  --  23 25  --   GLUCOSE 322* 154* 115* 412* 105* 185*  --   BUN 31* 30* 26*  --  19 17  --   CREATININE 2.06* 1.61* 1.40*  --  1.25* 1.19*  --   CALCIUM 8.9 9.3 9.2  --  9.3 9.4  --   MG  --   --   --  1.3*  --   --  1.7  PHOS  --   --   --  2.8  --   --   --    Liver Function Tests:  Recent Labs Lab 03/02/17 1907 03/03/17 0638  AST 16 22  ALT 10* 10*  ALKPHOS 103 102  BILITOT 0.6 0.5  PROT 6.4* 6.0*  ALBUMIN 2.7* 2.5*   Cardiac Enzymes:  Recent Labs Lab 03/01/17 0804  TROPONINI <0.03   CBG:  Recent Labs Lab 03/02/17 1142 03/02/17 1633 03/02/17 2053 03/03/17 0735 03/03/17 1140  GLUCAP 211* 76 134* 204* 194*   Urinalysis    Component Value Date/Time   COLORURINE STRAW (A) 02/28/2017 2141   APPEARANCEUR CLEAR 02/28/2017 2141   LABSPEC 1.018 02/28/2017 2141   PHURINE 5.0 02/28/2017 2141   GLUCOSEU >=500 (A) 02/28/2017 2141   HGBUR SMALL (A) 02/28/2017 2141   BILIRUBINUR NEGATIVE 02/28/2017 2141   KETONESUR 80 (A) 02/28/2017 2141   PROTEINUR 100 (A) 02/28/2017 2141   UROBILINOGEN 0.2 03/02/2015 1153   NITRITE NEGATIVE 02/28/2017 2141   LEUKOCYTESUR NEGATIVE 02/28/2017 2141   Discussed in detail with patient's spouse. Updated care and answered questions.   Time coordinating discharge: Less than 30 minutes  SIGNED:  Marcellus Scott, MD, FACP, FHM. [facility] Pager [phone]  If 7PM-7AM, please contact night-coverage [email or web address] Password [email or web address] 03/03/2017, 1:42 PM

## 2017-03-03 NOTE — Discharge Instructions (Signed)

## 2017-03-10 IMAGING — CR DG FOOT COMPLETE 3+V*R*
3 series · 3 of 3 positions shown · non-contrast
Comparison: 03/08/2015

CLINICAL DATA: Right foot wound and cellulitis for 6 months. Pain.
Swelling and erythema.

EXAM:
RIGHT FOOT COMPLETE - 3+ VIEW

[x foot ap right]
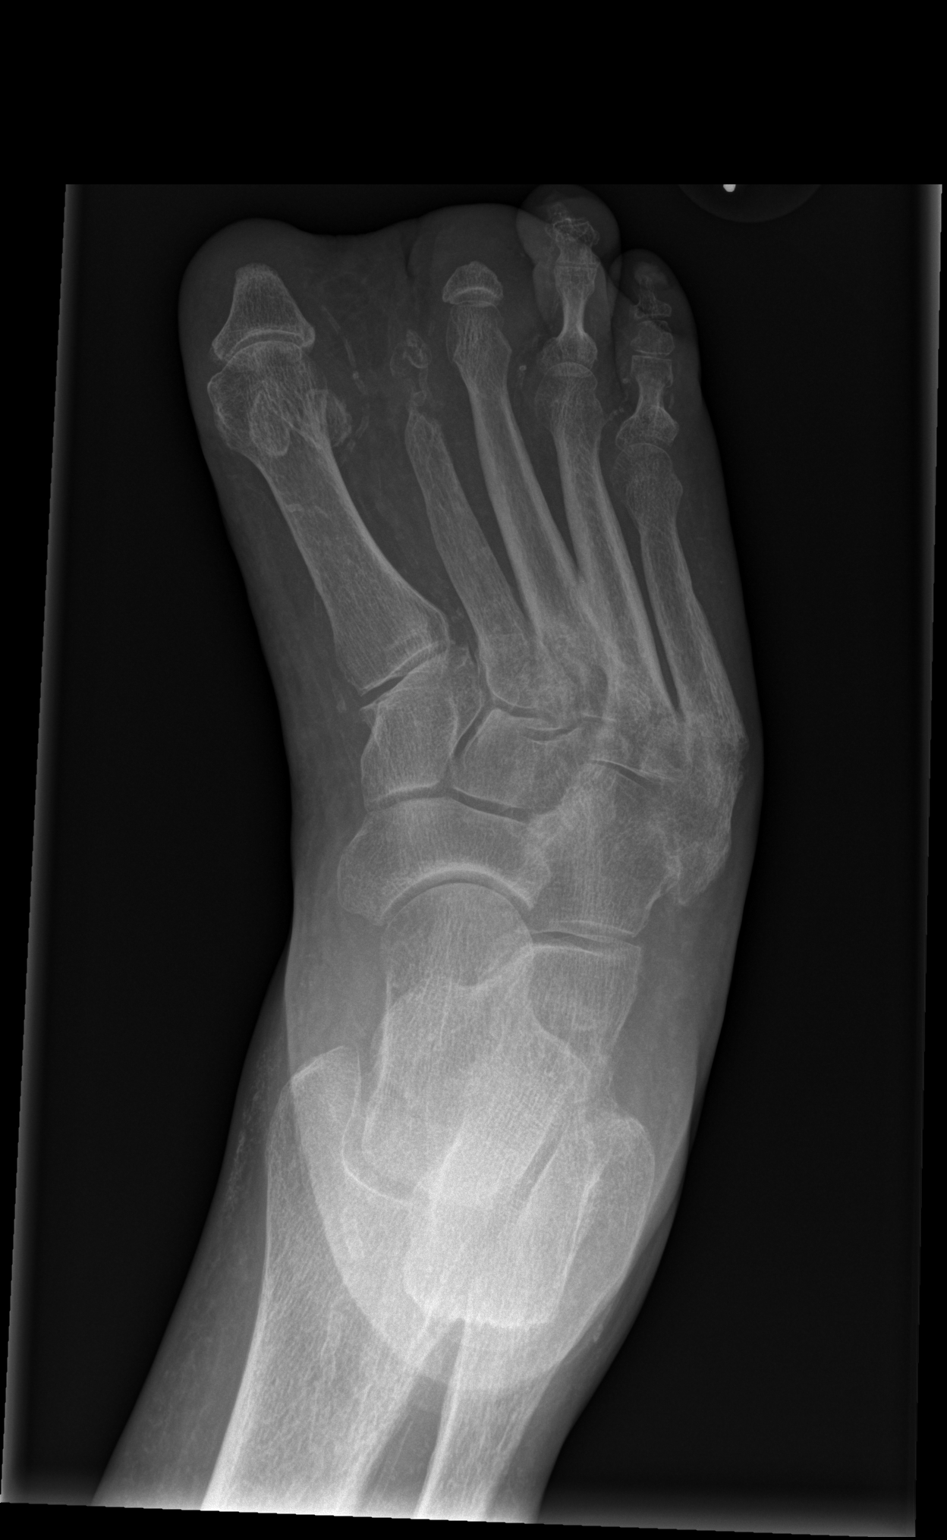

[x foot obl right]
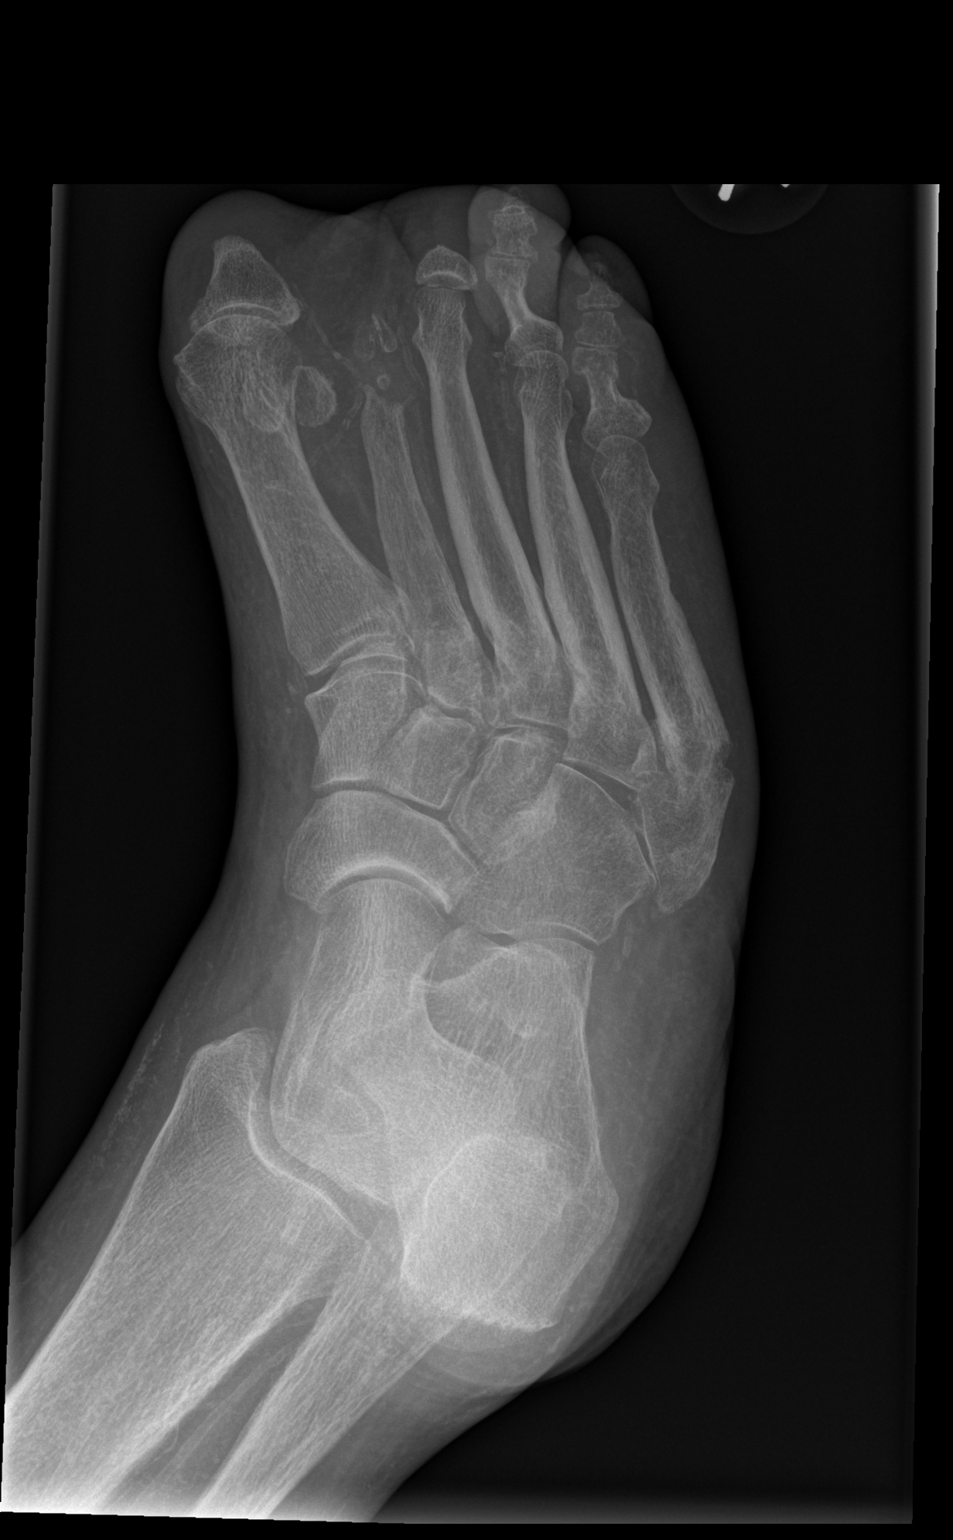

[x foot lat right]
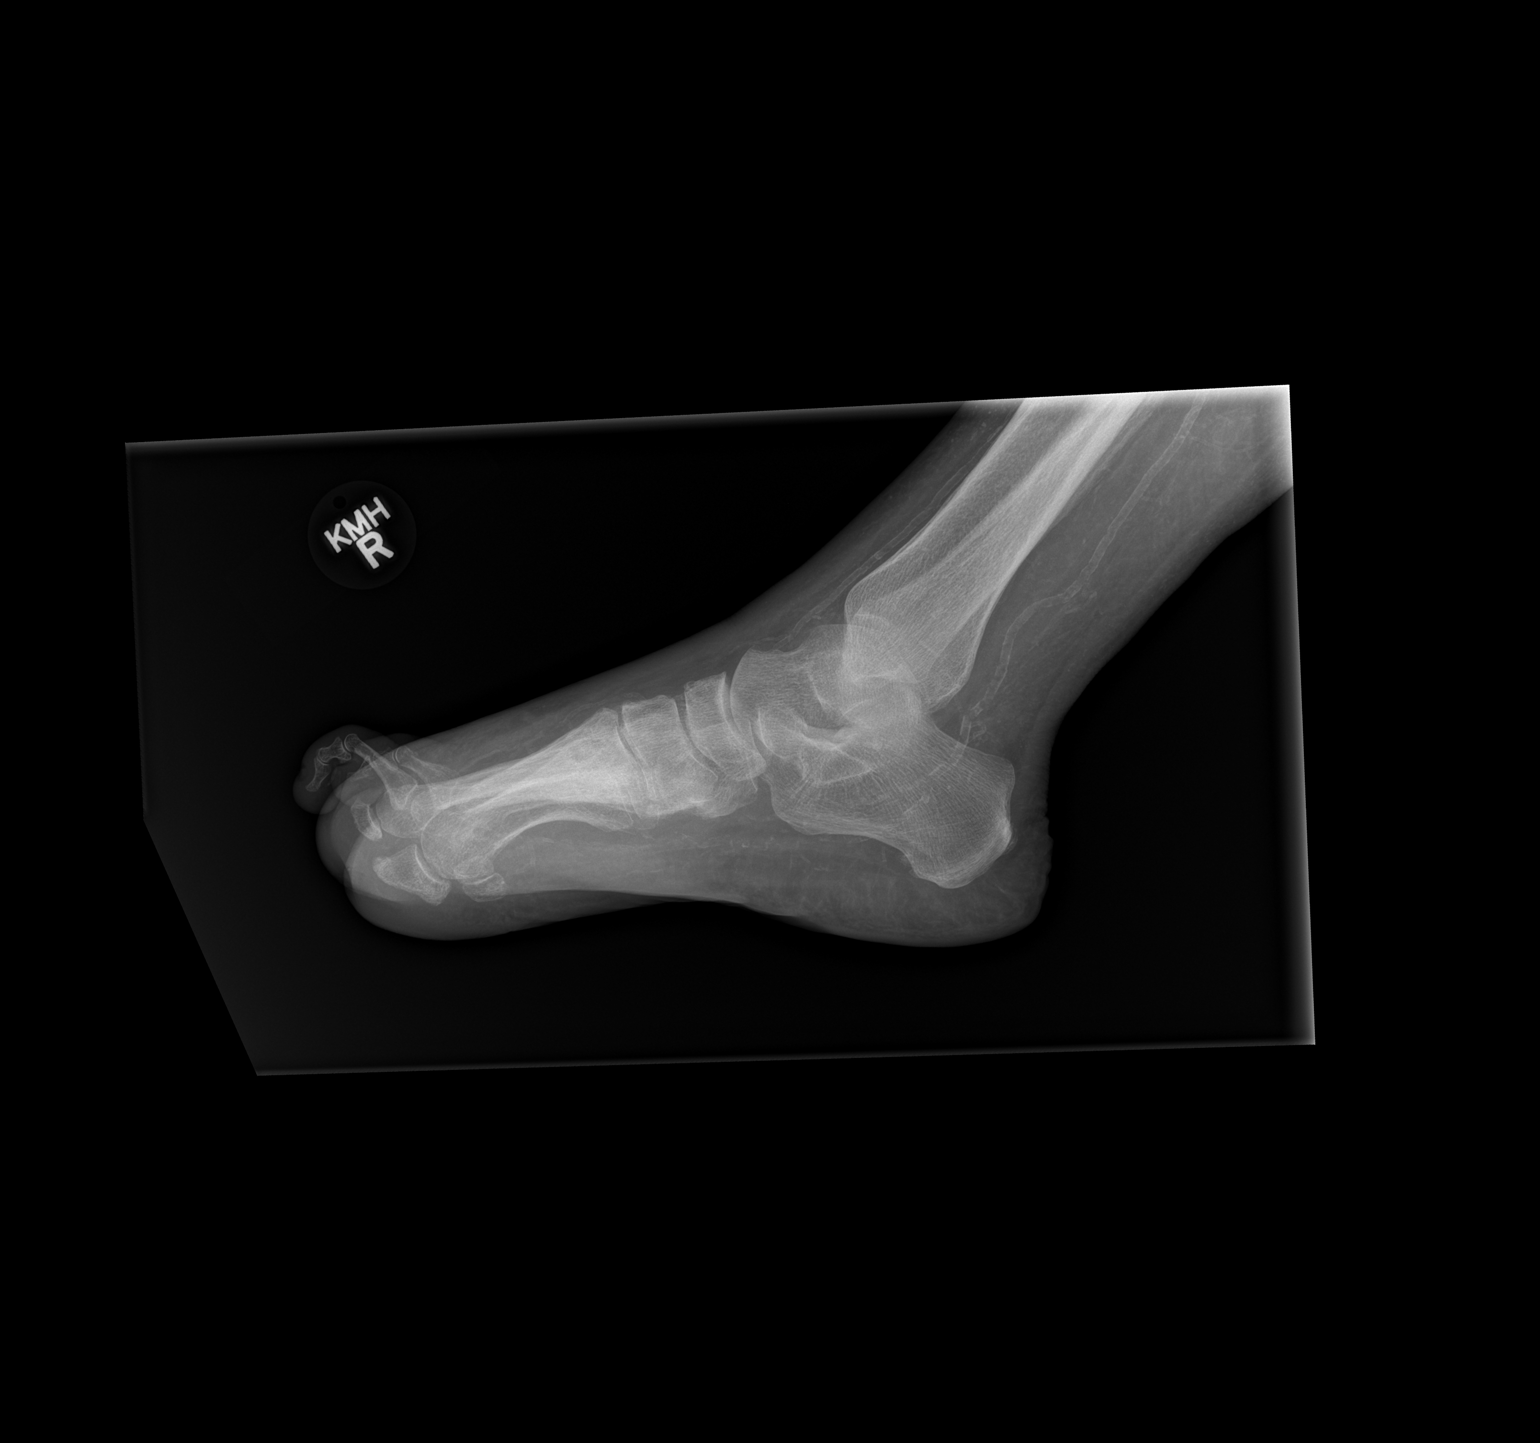

[3 of 3 positions shown; findings below may reference images not displayed]

FINDINGS: Stable prior amputations of the first, second, and third digits. No
progressive bony destructive findings. Old Jones fracture of the
fifth metatarsal, without definite bridging.

No malalignment at the Lisfranc joint. Vascular calcifications
noted.

Mild dorsal subcutaneous soft tissue swelling. Plantar calcaneal
spur. Ill definition of the posterior subtalar facet due to
obliquity.
IMPRESSION: 1. No current bony destructive findings characteristic of
osteomyelitis.
2. Prior amputations of the first 3 digits. Prior Jones fracture of
the fifth metatarsal, with questionable bony bridging, stable.
3. Dorsal soft tissue swelling. No abnormal gas tracking in the soft
tissues.

## 2017-03-19 IMAGING — CR DG FOOT COMPLETE 3+V*R*
3 series · 3 of 3 positions shown · non-contrast
Comparison: 05/23/2015

CLINICAL DATA: Soft tissue infection in the distal right foot

EXAM:
RIGHT FOOT COMPLETE - 3+ VIEW

[foot ap]
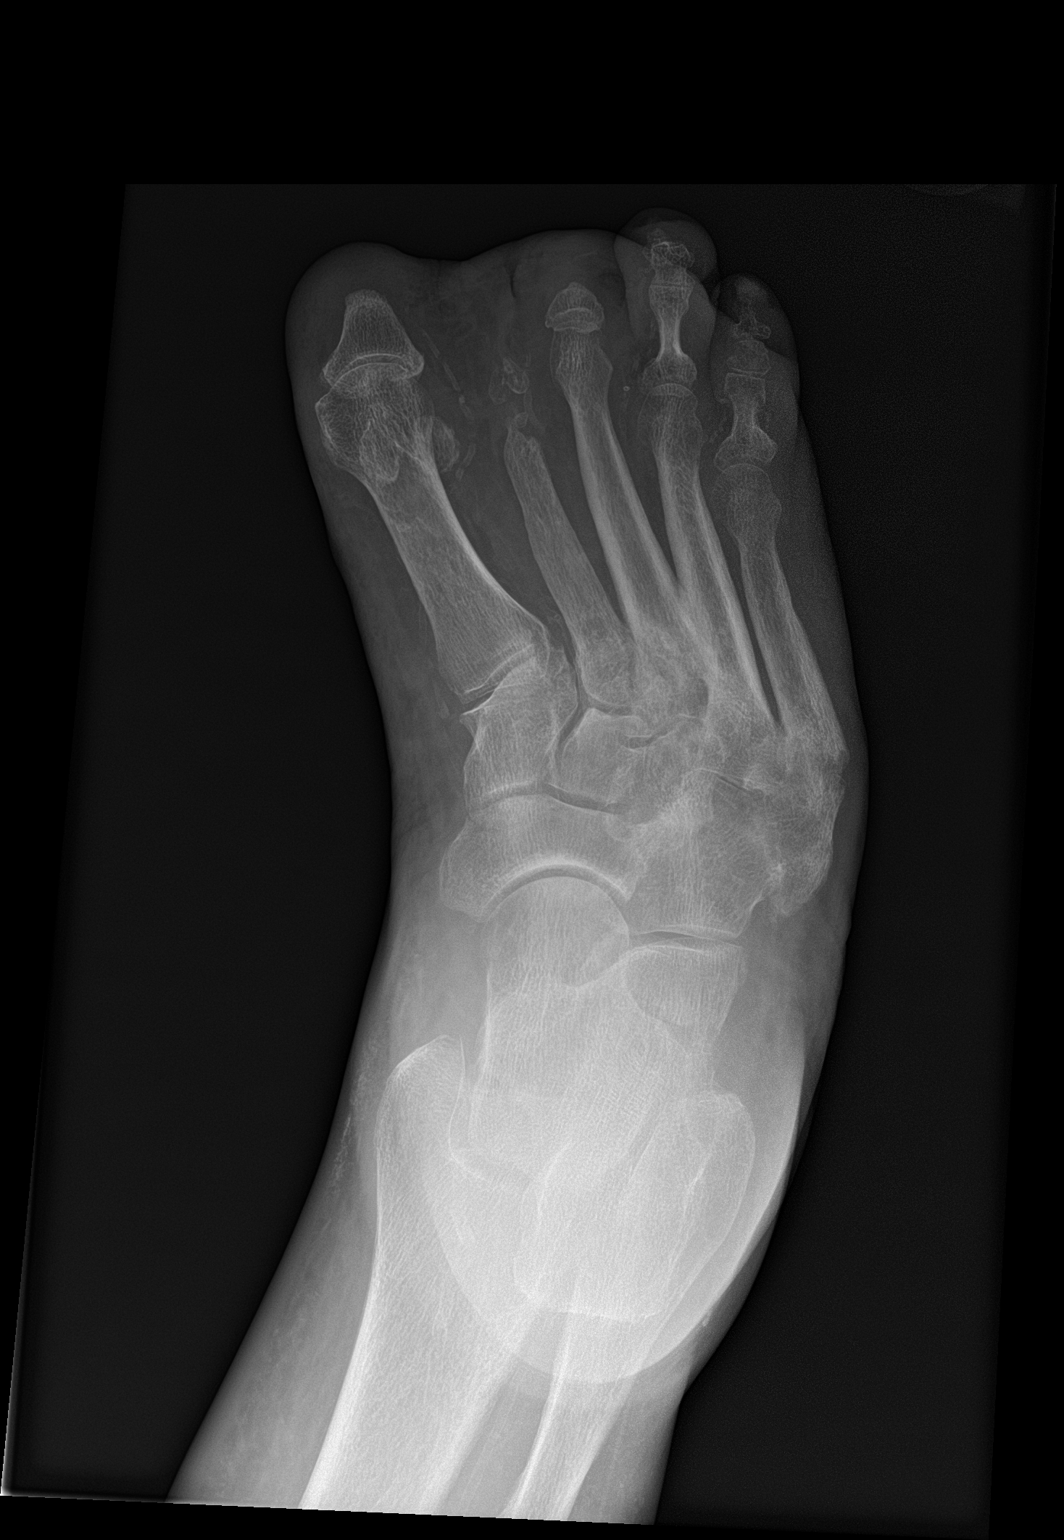

[foot obl]
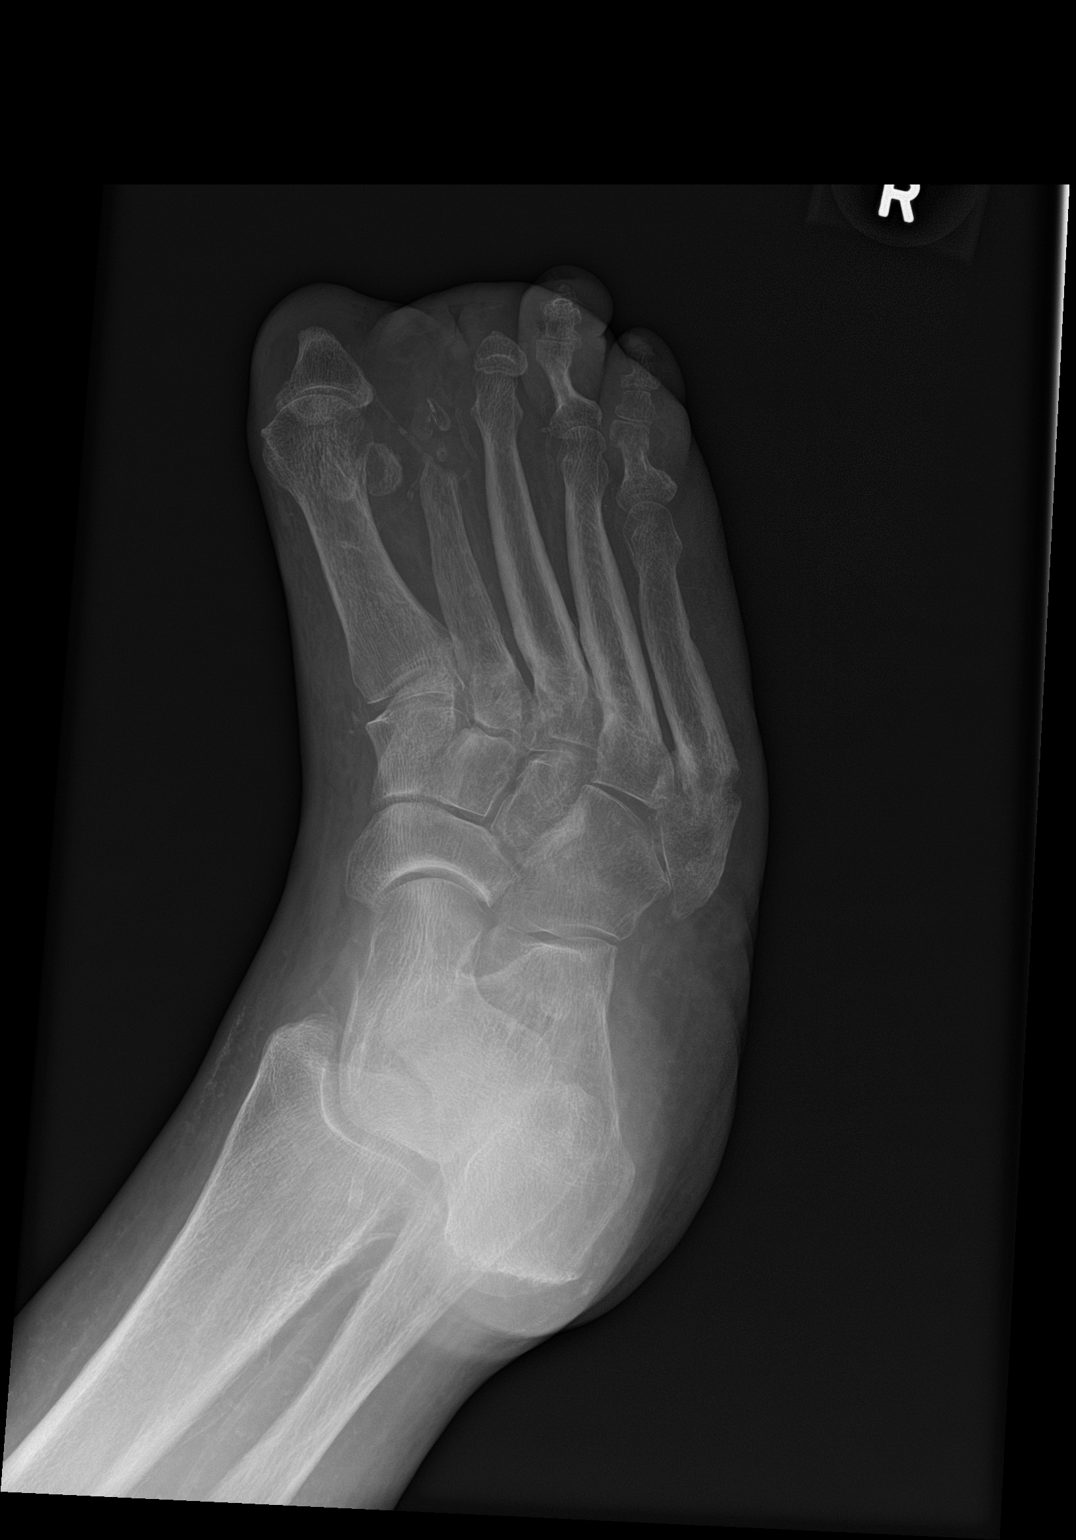

[foot lat]
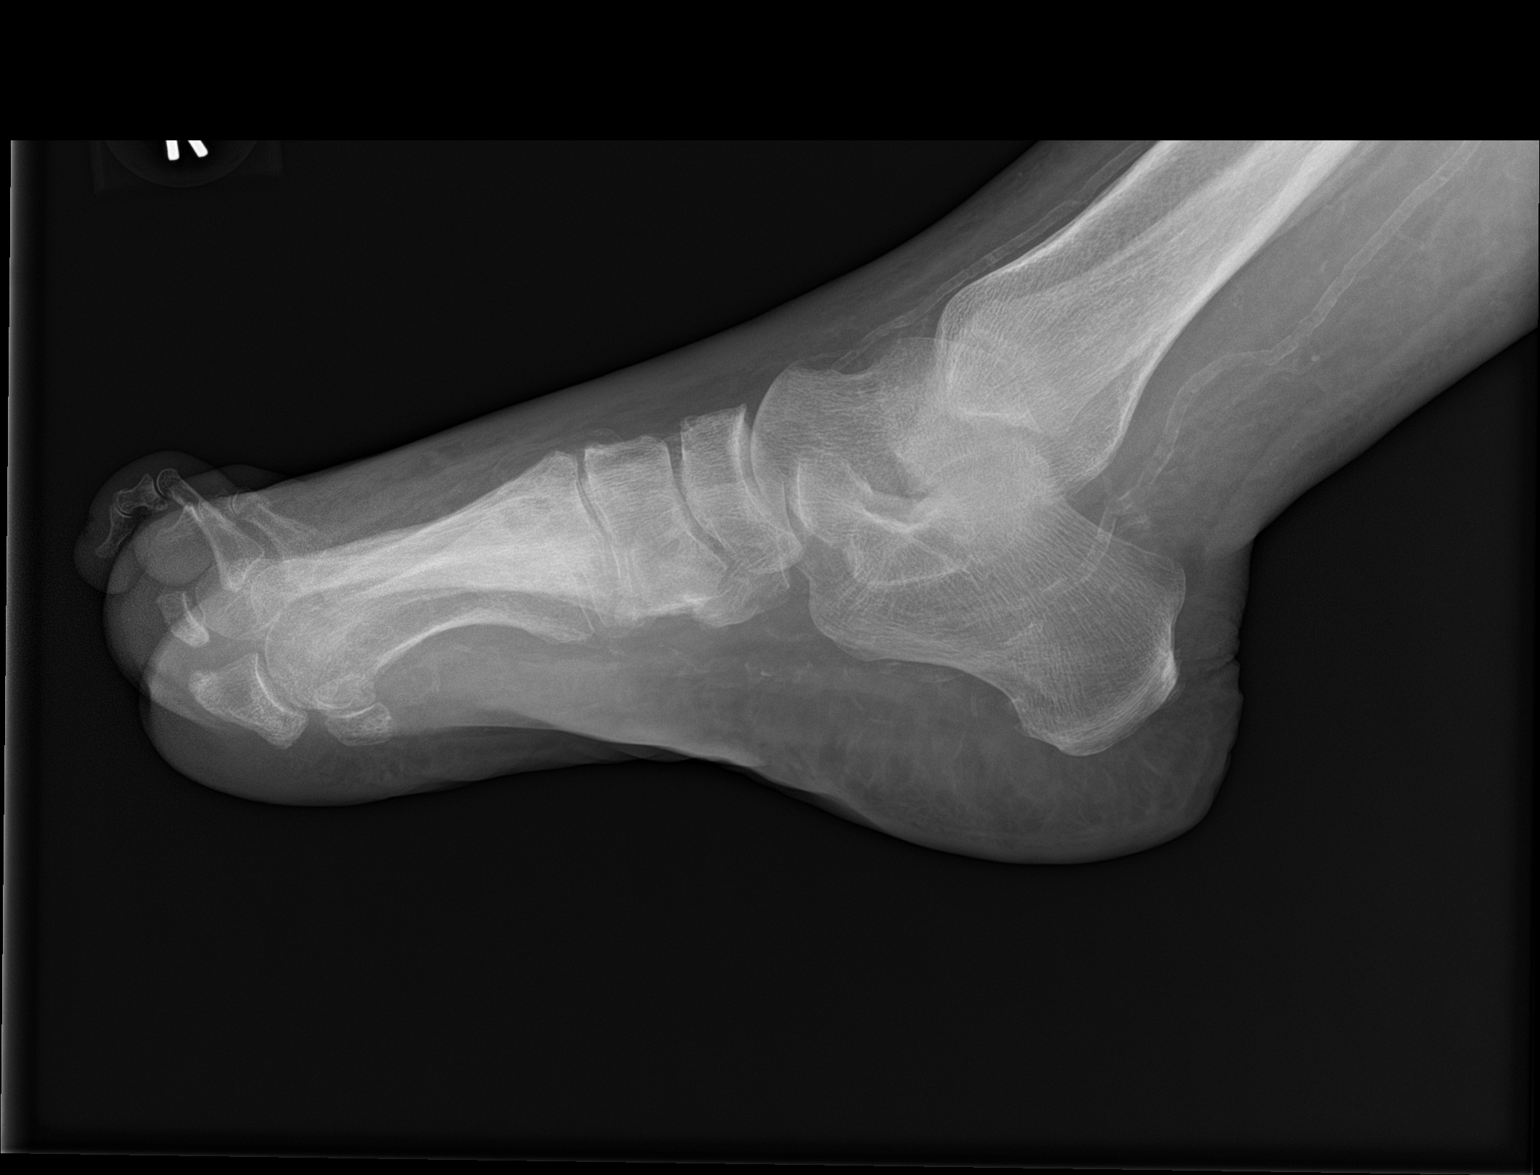

[3 of 3 positions shown; findings below may reference images not displayed]

FINDINGS: There again noted changes consistent with amputation of the first
through third digits. Fractures noted in the proximal fifth
metatarsal. Some generalized soft tissue swelling is noted distally.
No areas of bony erosion to suggest osteomyelitis are seen.
IMPRESSION: Soft tissue changes without acute osteomyelitis.

## 2017-03-20 IMAGING — MR MR FOOT*R* W/O CM
4 of 5 series · 18 of 40 positions shown · non-contrast
Comparison: Plain films of the right foot 06/01/2015.

CLINICAL DATA: Diabetic patient with an ulceration of the distal
right third ray which developed 3 days ago. A second ulcer on the
right foot developed yesterday. History of prior amputation.

EXAM:
MRI OF THE RIGHT FOREFOOT WITHOUT CONTRAST
TECHNIQUE: Multiplanar, multisequence MR imaging was performed. No intravenous
contrast was administered.

[Series 3: STIR · sagittal · 4.0mm · 0.29mm/px · 3 of 19 slices shown]
[im 4/19]
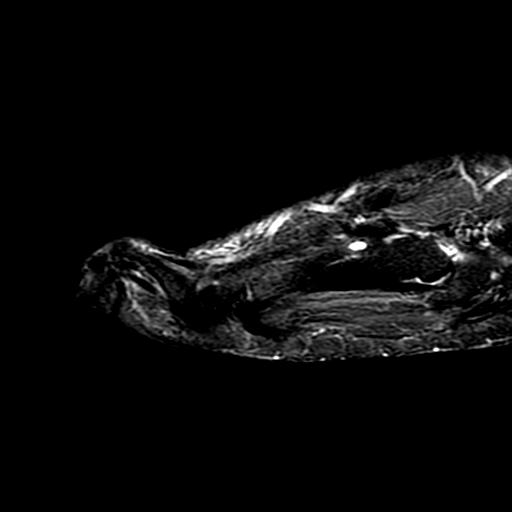
[im 11/19]
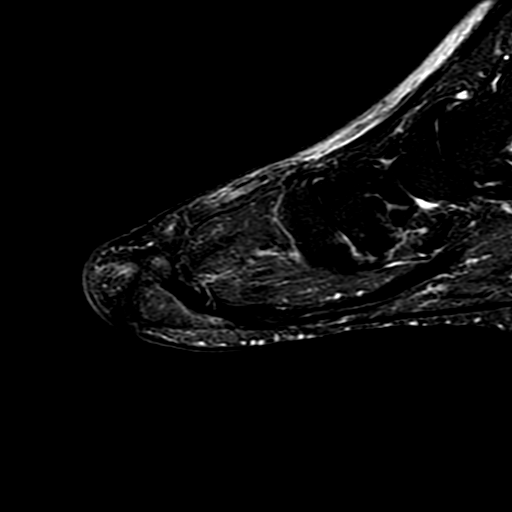
[im 19/19]
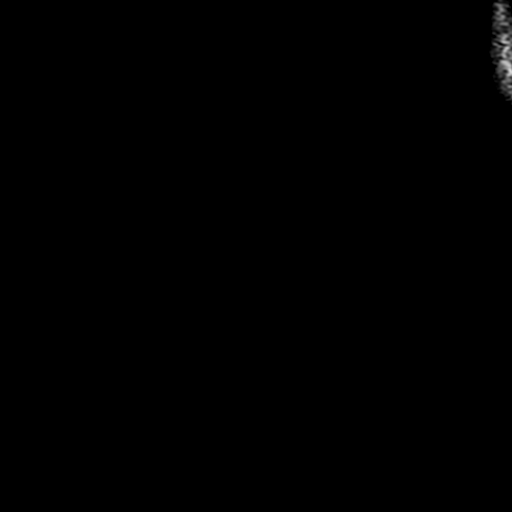

[Series 4: T1 · oblique · 4.0mm · 0.29mm/px · 4 of 28 slices shown (1 of 2)]
[im 1/28]
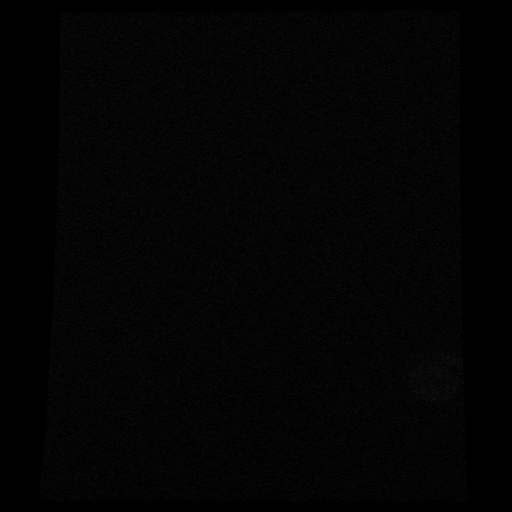
[im 4/28]
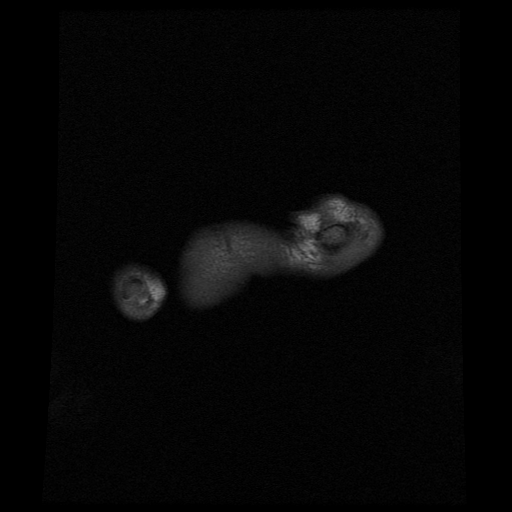
[im 16/28]
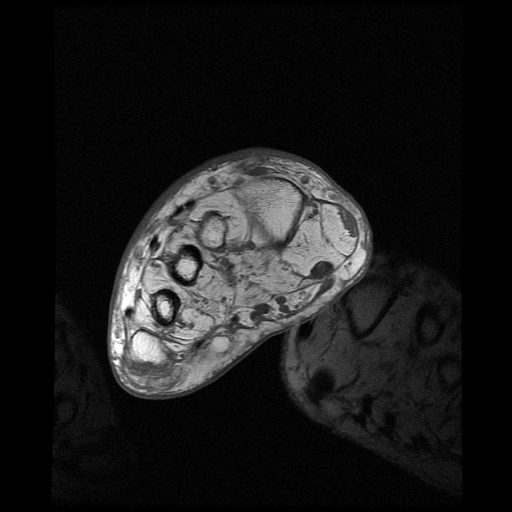
[im 25/28]
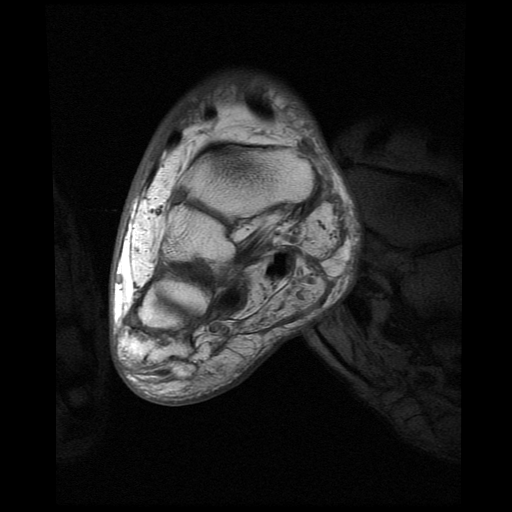

[Series 5: T2 fat-sat · oblique · 4.0mm · 0.29mm/px · 8 of 28 slices shown]
[im 1/28]
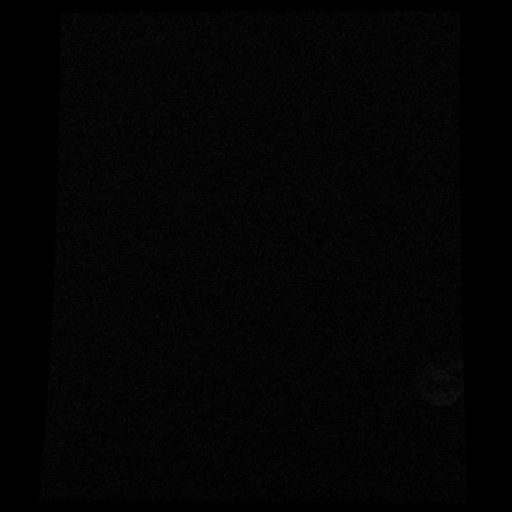
[im 4/28]
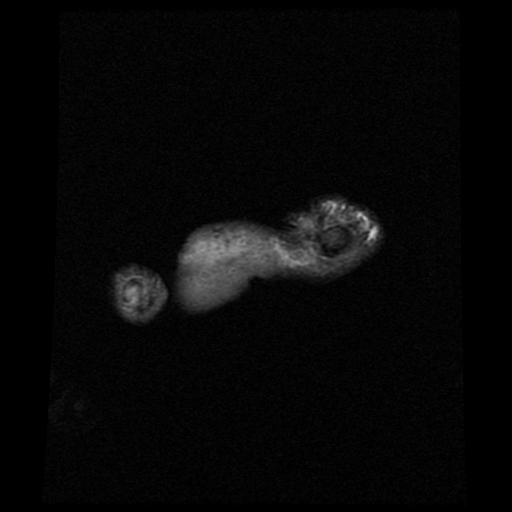
[im 10/28]
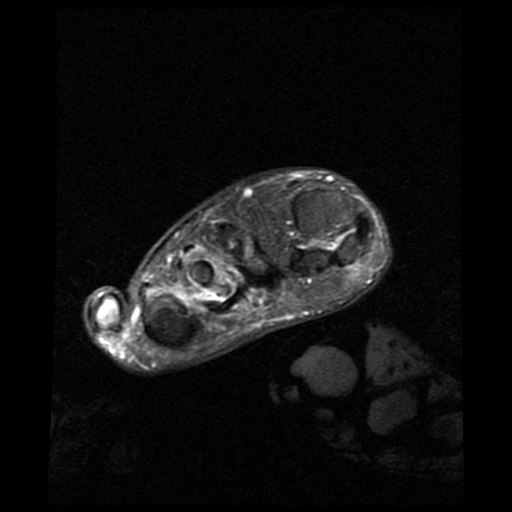
[im 13/28]
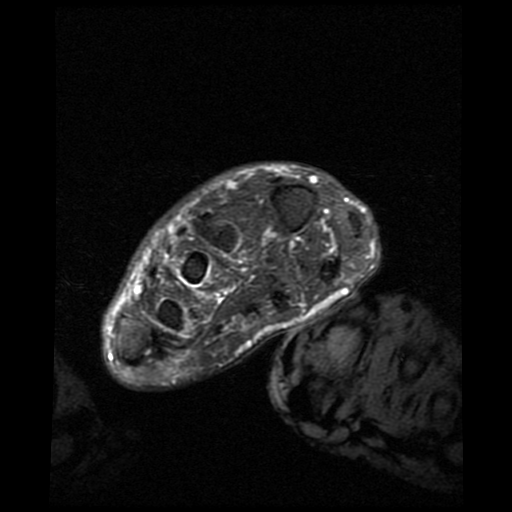
[im 16/28]
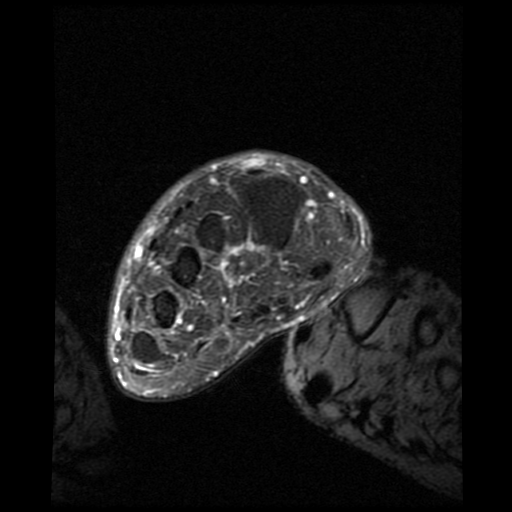
[im 19/28]
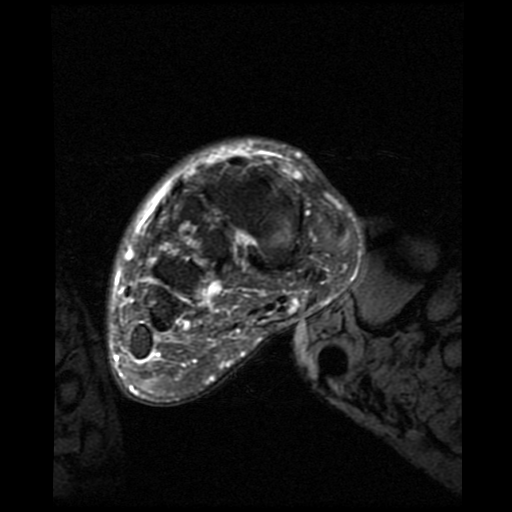
[im 25/28]
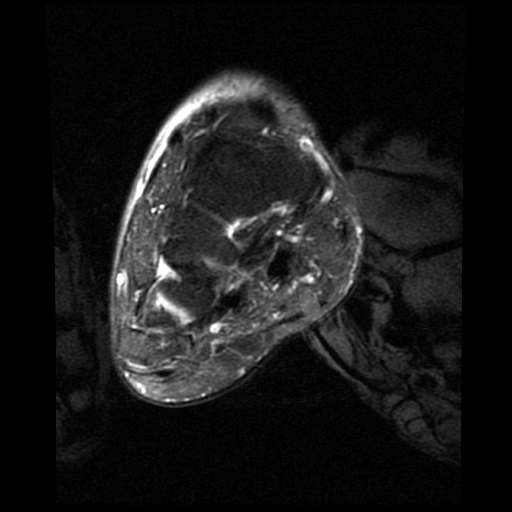
[im 28/28]
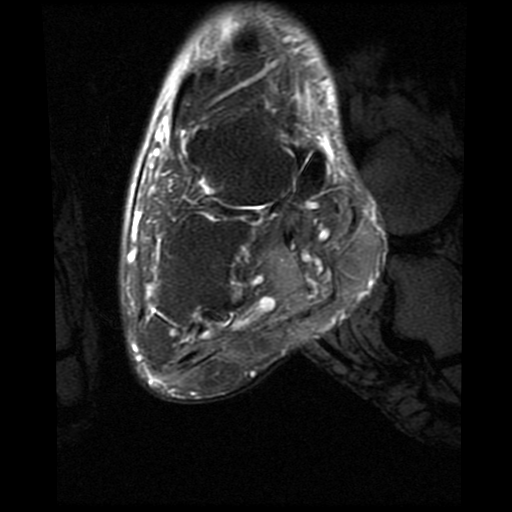

[Series 6: T1 · oblique · 3.0mm · 0.29mm/px · 3 of 20 slices shown (2 of 2)]
[im 4/20]
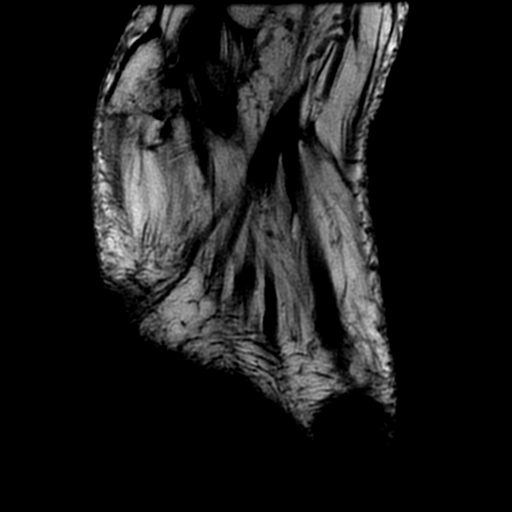
[im 10/20]
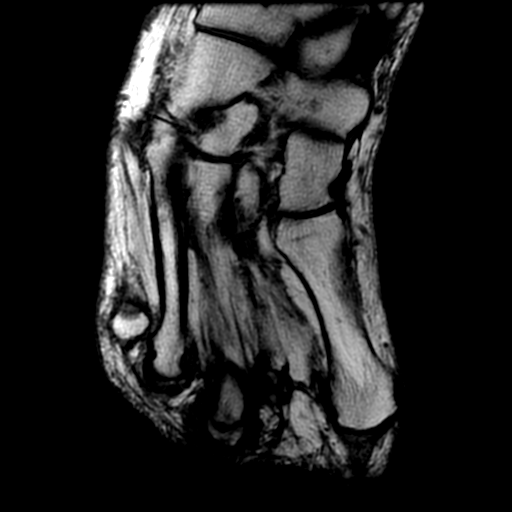
[im 16/20]
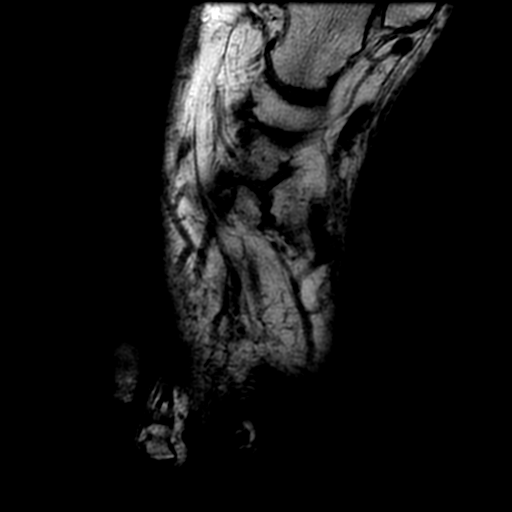

[18 of 40 positions shown; findings below may reference images not displayed]

FINDINGS: As seen on the comparison plain films, the patient is status post
amputation of the second ray at the level of the distal diaphysis.
There is also amputation of the third toe at the level of the base
of the proximal phalanx. There is edema in the remnant of the
proximal phalanx of the third toe and in the distal 1.5 cm of the
third metatarsal compatible with osteomyelitis. There appears to be
a skin ulceration over the dorsal aspect of the third toe remnant.
Although lack of IV contrast somewhat limits evaluation for abscess,
there appears to be focal fluid measuring 1.6 cm transverse by
cm craniocaudal by 1.1 cm long at the site of the ulceration
worrisome for abscess.

There is marrow edema in the distal 1 cm of the proximal phalanx of
the little toe with corresponding decreased T1 signal worrisome for
osteomyelitis. No associated abscess is identified.

Degenerative early neuropathic change is seen about the midfoot most
notable at the second and third tarsometatarsal joints where there
is subchondral cyst formation. Bone marrow signal is otherwise
unremarkable. No mass is identified. Intrinsic musculature the foot
is markedly atrophied.
IMPRESSION: Findings consistent with osteomyelitis in the distal 1.5 cm of the
third metatarsal and remnant of the proximal phalanx of the third
toe. There may be an associated abscess at the site of a skin
ulceration on the dorsal aspect of the third toe remnant.

Marrow edema in the distal 1 cm of the proximal phalanx of the fifth
toe worrisome for osteomyelitis. No associated abscess is
identified.

Degenerative versus early neuropathic change midfoot.

## 2017-03-22 ENCOUNTER — Encounter (HOSPITAL_COMMUNITY): Payer: Self-pay | Admitting: *Deleted

## 2017-03-22 ENCOUNTER — Emergency Department (HOSPITAL_COMMUNITY)
Admission: EM | Admit: 2017-03-22 | Discharge: 2017-03-23 | Disposition: A | Discharge status: Home / Self Care | Payer: Medicare Other | Source: Home / Self Care | Attending: Emergency Medicine | Admitting: Emergency Medicine

## 2017-03-22 DIAGNOSIS — E039 Hypothyroidism, unspecified: Secondary | ICD-10-CM

## 2017-03-22 DIAGNOSIS — Z87891 Personal history of nicotine dependence: Secondary | ICD-10-CM | POA: Insufficient documentation

## 2017-03-22 DIAGNOSIS — N186 End stage renal disease: Secondary | ICD-10-CM | POA: Insufficient documentation

## 2017-03-22 DIAGNOSIS — I120 Hypertensive chronic kidney disease with stage 5 chronic kidney disease or end stage renal disease: Secondary | ICD-10-CM

## 2017-03-22 DIAGNOSIS — Z940 Kidney transplant status: Secondary | ICD-10-CM

## 2017-03-22 DIAGNOSIS — L03119 Cellulitis of unspecified part of limb: Secondary | ICD-10-CM

## 2017-03-22 DIAGNOSIS — R600 Localized edema: Secondary | ICD-10-CM

## 2017-03-22 DIAGNOSIS — Z794 Long term (current) use of insulin: Secondary | ICD-10-CM | POA: Insufficient documentation

## 2017-03-22 DIAGNOSIS — L03115 Cellulitis of right lower limb: Secondary | ICD-10-CM | POA: Diagnosis not present

## 2017-03-22 DIAGNOSIS — Z79899 Other long term (current) drug therapy: Secondary | ICD-10-CM

## 2017-03-22 DIAGNOSIS — E1169 Type 2 diabetes mellitus with other specified complication: Secondary | ICD-10-CM

## 2017-03-22 DIAGNOSIS — R609 Edema, unspecified: Secondary | ICD-10-CM | POA: Insufficient documentation

## 2017-03-22 LAB — CBC WITH DIFFERENTIAL/PLATELET
BASOS ABS: 0 10*3/uL (ref 0.0–0.1)
BASOS PCT: 0 %
EOS ABS: 0.1 10*3/uL (ref 0.0–0.7)
EOS PCT: 3 %
HCT: 31 % — ABNORMAL LOW (ref 36.0–46.0)
HEMOGLOBIN: 9.7 g/dL — AB (ref 12.0–15.0)
Lymphocytes Relative: 33 %
Lymphs Abs: 1.4 10*3/uL (ref 0.7–4.0)
MCH: 27.9 pg (ref 26.0–34.0)
MCHC: 31.3 g/dL (ref 30.0–36.0)
MCV: 89.1 fL (ref 78.0–100.0)
Monocytes Absolute: 0.3 10*3/uL (ref 0.1–1.0)
Monocytes Relative: 7 %
NEUTROS PCT: 57 %
Neutro Abs: 2.5 10*3/uL (ref 1.7–7.7)
PLATELETS: 216 10*3/uL (ref 150–400)
RBC: 3.48 MIL/uL — AB (ref 3.87–5.11)
RDW: 14.2 % (ref 11.5–15.5)
WBC: 4.4 10*3/uL (ref 4.0–10.5)

## 2017-03-22 LAB — COMPREHENSIVE METABOLIC PANEL WITH GFR
ALT: 8 U/L — ABNORMAL LOW (ref 14–54)
AST: 13 U/L — ABNORMAL LOW (ref 15–41)
Albumin: 2.4 g/dL — ABNORMAL LOW (ref 3.5–5.0)
Alkaline Phosphatase: 99 U/L (ref 38–126)
Anion gap: 6 (ref 5–15)
BUN: 20 mg/dL (ref 6–20)
CO2: 23 mmol/L (ref 22–32)
Calcium: 9.3 mg/dL (ref 8.9–10.3)
Chloride: 109 mmol/L (ref 101–111)
Creatinine, Ser: 1.47 mg/dL — ABNORMAL HIGH (ref 0.44–1.00)
GFR calc Af Amer: 47 mL/min — ABNORMAL LOW
GFR calc non Af Amer: 41 mL/min — ABNORMAL LOW
Glucose, Bld: 94 mg/dL (ref 65–99)
Potassium: 3.8 mmol/L (ref 3.5–5.1)
Sodium: 138 mmol/L (ref 135–145)
Total Bilirubin: 0.3 mg/dL (ref 0.3–1.2)
Total Protein: 5.8 g/dL — ABNORMAL LOW (ref 6.5–8.1)

## 2017-03-22 LAB — CBG MONITORING, ED
GLUCOSE-CAPILLARY: 48 mg/dL — AB (ref 65–99)
Glucose-Capillary: 43 mg/dL — CL (ref 65–99)
Glucose-Capillary: 49 mg/dL — ABNORMAL LOW (ref 65–99)
Glucose-Capillary: 72 mg/dL (ref 65–99)

## 2017-03-22 LAB — BRAIN NATRIURETIC PEPTIDE: B Natriuretic Peptide: 218 pg/mL — ABNORMAL HIGH (ref 0.0–100.0)

## 2017-03-22 MED ORDER — CEPHALEXIN 500 MG PO CAPS: 500.0000 mg | ORAL_CAPSULE | Freq: Two times a day (BID) | ORAL | 0 refills | Status: DC

## 2017-03-22 NOTE — Discharge Instructions (Signed)
Please take the antibiotics to treat your cellulitis infection of your leg. Please follow-up with your primary care physician for recheck of your bilateral leg swelling. If any symptoms change or worsen, please return immediately to the nearest emergency department for further management.

## 2017-03-22 NOTE — ED Notes (Signed)
Gave pt OJ and sandwhich, will continue to monitor in Triage.

## 2017-03-22 NOTE — ED Notes (Signed)
CBG low. MD notified patient given pnut cracker and milk. A+O NAD noted

## 2017-03-22 NOTE — ED Triage Notes (Addendum)
To ED for eval of ble swelling for the past couple of days. Pt states she has noticed small sores on her legs. Legs noted to have redness- right more than left.  States she has noticed her sugars are higher than normally run. Ambulatory but states she will get severe cramps in her legs intermittently. Hx of dvt and kidney transplant

## 2017-03-22 NOTE — ED Notes (Signed)
Pt's CBG result was 48. Informed Tori - RN.

## 2017-03-22 NOTE — ED Provider Notes (Signed)
[facility]-EMERGENCY DEPT Provider Note   CSN: [id number] Arrival date & time: 03/22/17  1613     History   Chief Complaint Chief Complaint  Patient presents with  . Leg Swelling    HPI [name] is a 51 y.o. female.  The history is provided by the patient and medical records.  Rash   This is a new problem. The current episode started 2 days ago. The problem has been gradually worsening. The problem is associated with nothing. There has been no fever. The rash is present on the left lower leg and right lower leg. The pain is at a severity of 2/10. The pain is mild. The pain has been constant since onset. Associated symptoms include pain and weeping. Pertinent negatives include no itching. She has tried nothing for the symptoms. The treatment provided no relief.    Past Medical History:  Diagnosis Date  . Arthritis    "elbows, knees, legs, back" (09/24/2015)  . CKD (chronic kidney disease)   . Daily headache   . Depression    "years ago"  . End stage renal disease (HCC)    right arm AV graft, post transplant  . Hypertension   . Hypothyroid   . 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 Kidney disease   . Pneumonia ~ 2007?  [name] Type II diabetes mellitus (HCC)    Insulin dependant    Patient Active Problem List   Diagnosis Date Noted  . Anemia 01/26/2017  . Abnormal LFTs 01/26/2017  . DKA (diabetic ketoacidoses) (HCC) 08/12/2016  . Diabetic acidosis without coma (HCC)   . DKA, type 2 (HCC) 07/21/2016  . Hypoglycemia   . Wound cellulitis   . CKD (chronic kidney disease) stage 3, GFR 30-59 ml/min 06/26/2016  . Cellulitis 06/26/2016  . Cellulitis of right lower extremity   . Hyperglycemia   . QT prolongation 05/13/2016  . UTI (lower urinary tract infection)   . Hypomagnesemia   . Pressure ulcer 04/11/2016  . Acute encephalopathy 04/11/2016  . Altered mental state 04/11/2016  . Altered mental status 04/10/2016  . Insulin dependent diabetes  mellitus (HCC) 04/10/2016  . Diabetic ketoacidosis without coma associated with type 2 diabetes mellitus (HCC) 11/02/2015  . Hyperglycemia without ketosis 09/24/2015  . CKD (chronic kidney disease) stage 2, GFR 60-89 ml/min 09/24/2015  . History of renal transplant 09/24/2015  . Uncontrolled type 1 diabetes mellitus with foot ulcer (HCC) 06/04/2015  . History of renal transplantation 06/04/2015  . Foot ulcer, right (HCC)   . Foot abscess, right   . Diarrhea 06/02/2015  . DM (diabetes mellitus) type 2, uncontrolled, with ketoacidosis (HCC) 06/02/2015  . Type 1 diabetes mellitus with diabetic foot ulcer (HCC) 06/02/2015  . Acute renal failure superimposed on stage 3 chronic kidney disease (HCC) 06/02/2015  . Diabetic foot infection (HCC) 06/01/2015  . Essential hypertension 06/01/2015  . GERD (gastroesophageal reflux disease) 06/01/2015  . AKI (acute kidney injury) (HCC) 01/02/2015  . Hypotension 03/22/2014  . End stage renal disease (HCC) 10/24/2012  . H/O kidney transplant 08/04/2012  . High anion gap metabolic acidosis 08/04/2012  . Sepsis (HCC) 08/04/2012  . DKA, type 1 (HCC) 09/22/2011  . Hyperkalemia 09/22/2011  . Hyponatremia 09/22/2011  . Immunosuppression (HCC)   . Hypothyroidism     Past Surgical History:  Procedure Laterality Date  . AMPUTATION Left 05/11/2014   Procedure: AMPUTATION LEFT GREAT TOE;  Surgeon: Kathryne Hitch, MD;  Location: [facility];  Service: Orthopedics;  Laterality: Left;  . AV FISTULA PLACEMENT Right    forearm  . BACK SURGERY    . CATARACT EXTRACTION W/ INTRAOCULAR LENS  IMPLANT, BILATERAL Bilateral   . CESAREAN SECTION  07/1999  . DG AV DIALYSIS GRAFT DECLOT OR    . INCISION AND DRAINAGE Right 06/02/2015   Procedure: INCISION AND DRAINAGE OF RIGHT 3rd RAY RESECTION;  Surgeon: Kathryne Hitchhristopher Y Blackman, MD;  Location: [facility] OR;  Service: Orthopedics;  Laterality: Right;  . 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 LUMBAR DISC SURGERY  2001  .  NEPHRECTOMY TRANSPLANTED ORGAN    . TOE AMPUTATION Right    1,2 & 3rd toes.  . TUBAL LIGATION  07/1999    OB History    No data available       Home Medications    Prior to Admission medications   Medication Sig Start Date End Date Taking? Authorizing Provider  acetaminophen (TYLENOL) 325 MG tablet Take 2 tablets (650 mg total) by mouth every 6 (six) hours as needed for mild pain, moderate pain, fever or headache. 05/15/16  Yes Hongalgi, Maximino GreenlandAnand D, MD  amLODipine (NORVASC) 10 MG tablet Take 1 tablet (10 mg total) by mouth daily. 08/14/16  Yes Regalado, Belkys A, MD  carvedilol (COREG) 25 MG tablet Take 50 mg by mouth 2 (two) times daily. 02/26/17  Yes [provider]  insulin NPH-regular Human (NOVOLIN 70/30) (70-30) 100 UNIT/ML injection Inject 40 Units into the skin 2 (two) times daily with a meal. 03/03/17  Yes Hongalgi, Maximino GreenlandAnand D, MD  levothyroxine (SYNTHROID, LEVOTHROID) 137 MCG tablet Take 1 tablet (137 mcg total) by mouth daily. BRAND NAME ONLY 03/04/15  Yes Alison Murrayevine, Alma M, MD  mycophenolate (CELLCEPT) 250 MG capsule Take 500 mg by mouth 2 (two) times daily.    Yes [provider]  predniSONE (DELTASONE) 5 MG tablet Take 5 mg by mouth at bedtime.  05/29/15  Yes [provider]  ranitidine (ZANTAC) 150 MG tablet Take 150 mg by mouth at bedtime.  03/27/16  Yes [provider]  tacrolimus (PROGRAF) 0.5 MG capsule Take 0.5-1 mg by mouth 2 (two) times daily. Take 0.5mg  by mouth in the morning and 1mg  by mouth at bedtime   Yes [provider]    Family History Family History  Problem Relation Age of Onset  . Emphysema Mother   . Throat cancer Mother   . COPD Mother   . Cancer Mother   . Emphysema Father   . COPD Father   . Stroke Father   . ADD / ADHD Son     Social History Social History  Substance Use Topics  . Smoking status: Former Smoker    Packs/day: 1.00    Years: 11.00    Types: Cigarettes    Quit date: 09/21/1998  . Smokeless  tobacco: Never Used  . Alcohol use No     Allergies   Ace inhibitors; Adhesive [tape]; and Lisinopril   Review of Systems Review of Systems  Constitutional: Negative for chills, fatigue and fever.  HENT: Negative for congestion and rhinorrhea.   Respiratory: Negative for cough, chest tightness and shortness of breath.   Cardiovascular: Positive for leg swelling. Negative for chest pain and palpitations.  Genitourinary: Negative for dysuria.  Musculoskeletal: Negative for neck pain and neck stiffness.  Skin: Positive for rash. Negative for itching.  Neurological: Negative for dizziness, light-headedness and headaches.  Psychiatric/Behavioral: Negative for confusion.  All other systems reviewed and are negative.  Physical Exam Updated Vital Signs BP (!) 165/72   Pulse 75   Temp 98.1 F (36.7 C) (Oral)   Resp 18   LMP 11/19/2011   SpO2 100%   Physical Exam  Constitutional: She appears well-developed and well-nourished. No distress.  HENT:  Head: Normocephalic.  Mouth/Throat: Oropharynx is clear and moist. No oropharyngeal exudate.  Eyes: Pupils are equal, round, and reactive to light. Conjunctivae and EOM are normal.  Neck: Normal range of motion.  Cardiovascular: Normal rate and intact distal pulses.   No murmur heard. Pulmonary/Chest: Effort normal and breath sounds normal. No stridor. She has no rales. She exhibits no tenderness.  Abdominal: There is no tenderness.  Musculoskeletal: She exhibits tenderness.       Right lower leg: She exhibits tenderness and edema. She exhibits no laceration.       Left lower leg: She exhibits edema. She exhibits no tenderness.       Legs: Neurological: No cranial nerve deficit or sensory deficit. She exhibits normal muscle tone.  Skin: Skin is warm. Capillary refill takes less than 2 seconds. Rash noted. She is not diaphoretic. There is erythema.  Nursing note and vitals reviewed.    ED Treatments / Results  Labs (all labs  ordered are listed, but only abnormal results are displayed) Labs Reviewed  COMPREHENSIVE METABOLIC PANEL - Abnormal; Notable for the following:       Result Value   Creatinine, Ser 1.47 (*)    Total Protein 5.8 (*)    Albumin 2.4 (*)    AST 13 (*)    ALT 8 (*)    GFR calc non Af Amer 41 (*)    GFR calc Af Amer 47 (*)    All other components within normal limits  CBC WITH DIFFERENTIAL/PLATELET - Abnormal; Notable for the following:    RBC 3.48 (*)    Hemoglobin 9.7 (*)    HCT 31.0 (*)    All other components within normal limits  BRAIN NATRIURETIC PEPTIDE - Abnormal; Notable for the following:    B Natriuretic Peptide 218.0 (*)    All other components within normal limits  CBG MONITORING, ED - Abnormal; Notable for the following:    Glucose-Capillary 48 (*)    All other components within normal limits  CBG MONITORING, ED - Abnormal; Notable for the following:    Glucose-Capillary 49 (*)    All other components within normal limits  CBG MONITORING, ED - Abnormal; Notable for the following:    Glucose-Capillary 43 (*)    All other components within normal limits  CBG MONITORING, ED    EKG  EKG Interpretation None       Radiology No results found.  Procedures Procedures (including critical care time)  Medications Ordered in ED Medications - No data to display   Initial Impression / Assessment and Plan / ED Course  I have reviewed the triage vital signs and the nursing notes.  Pertinent labs & imaging results that were available during my care of the patient were reviewed by me and considered in my medical decision making (see chart for details).     TAYTUM SCHECK is a 51 y.o. female with a past medical history significant for diabetes with recent DKA, kidney transplant on immunosuppression, hypertension, GERD, and thyroid disease who presents with bilateral lower extremity edema and rash on legs. Patient denies any fevers, chills, chest pain, shortness of  breath, nausea, vomiting, constipation, diarrhea, or dysuria. She reports that her legs  been swollen for the last few weeks in both legs. She says that over the last several days she has noticed red spots on her right leg more than her left. They are painful. She says they are weeping. She reports they're similar to prior cellulitis of the legs. She denies unilateral leg swelling and says this is not seem like her prior DVT. She denies recent traumatic injuries. She denies any other complaints on arrival.  She denies significant fatigue.  On exam, patient had bilateral lower extremity edema. Patient has multiple red areas of erythema and tenderness on the right leg. These areas appear like cellulitis. No crepitance. No fluctuance. No evidence of abscess. No significant tenderness. Bilateral EP and PT pulses were appreciated. Mild edema in both legs. Lungs were clear and chest is nontender. Abdomen nontender.  Based on symptoms, suspect cellulitis. Patient had screening laboratory testing given her recent admission for DKA. Patient's glucose was actually low in the ED. Patient was given food and fluids with normalization improvement. BNP slightly elevated at 218, suspect this is reflexive of her fluid overload in the legs. Creatinine 1.47 which is lightly worse than prior but improved from recent values. Hemoglobin has dropped to 9.7 from 3 weeks ago was value. Patient denies any rectal bleeding. Patient does not feel fatigued and has no symptoms of anemia.    given patient's redness and appearance of cellulitis, patient will be given antibiotics to treat for cellulitis. Clinically, given the bilateral appearance of the swelling and the disc similarity to prior DVT, do not suspect DVT at this time. At this facility at the time of her evaluation, DVT ultrasound is not available. Given the low suspicion, do not feel patient needs anticoagulation however if symptoms were to worsen or she develops any respiratory  symptoms or the swelling became unilateral, patient advised that she would need an ultrasound and possible blood thinners. Patient also advised that she is to follow with her PCP for recheck of her kidney function and BNP. Will defer to PCP for diuresis choices in the setting of her elevated creatinine.  Given patient's well appearance and after shared decision-making conversation, decision made to discharge patient with outpatient follow-up. Patient clearly understands return precautions. Patient was understanding the plan of care and had no other questions or concerns. Patient was discharged in good condition with antibiotics for her cellulitis.   Final Clinical Impressions(s) / ED Diagnoses   Final diagnoses:  Peripheral edema  Cellulitis of lower extremity, unspecified laterality    New Prescriptions Discharge Medication List as of 03/22/2017 11:57 PM    START taking these medications   Details  cephALEXin (KEFLEX) 500 MG capsule Take 1 capsule (500 mg total) by mouth 2 (two) times daily., Starting Mon 03/22/2017, Until Mon 03/29/2017, Print       Clinical Impression: 1. Peripheral edema   2. Cellulitis of lower extremity, unspecified laterality     Disposition: Discharge  Condition: Good  I have discussed the results, Dx and Tx plan with the pt(& family if present). He/she/they expressed understanding and agree(s) with the plan. Discharge instructions discussed at great length. Strict return precautions discussed and pt &/or family have verbalized understanding of the instructions. No further questions at time of discharge.    Discharge Medication List as of 03/22/2017 11:57 PM    START taking these medications   Details  cephALEXin (KEFLEX) 500 MG capsule Take 1 capsule (500 mg total) by mouth 2 (two) times daily., Starting Mon 03/22/2017, Until Mon  03/29/2017, Print        Follow Up: Dois Davenport, MD [address] [place] [address] [phone]  Schedule  an appointment as soon as possible for a visit    [facility] [address] [place] [address] [phone] Schedule an appointment as soon as possible for a visit    [facility] EMERGENCY DEPARTMENT [address] [id number] mc [place] [address] [phone]  If symptoms worsen     Karlyn Glasco, Canary Brim, MD 03/23/17 [phone]

## 2017-03-22 NOTE — ED Notes (Signed)
Tegeler MD made aware of CBG. Pt to eat/drink. Recheck sugar and follow up as necessary.

## 2017-03-22 NOTE — ED Notes (Signed)
Pt told tech first her sugar felt low, CBG checked and noted to be 48. Pt taken to triage.

## 2017-03-23 ENCOUNTER — Encounter (HOSPITAL_COMMUNITY): Payer: Self-pay | Admitting: Emergency Medicine

## 2017-03-23 ENCOUNTER — Inpatient Hospital Stay (HOSPITAL_COMMUNITY)
Admission: EM | Admit: 2017-03-23 | Discharge: 2017-03-29 | DRG: 602 | Disposition: A | Discharge status: Home / Self Care | Payer: Medicare Other | Attending: Internal Medicine | Admitting: Internal Medicine

## 2017-03-23 ENCOUNTER — Emergency Department (HOSPITAL_COMMUNITY): Payer: Medicare Other

## 2017-03-23 DIAGNOSIS — Z22322 Carrier or suspected carrier of Methicillin resistant Staphylococcus aureus: Secondary | ICD-10-CM

## 2017-03-23 DIAGNOSIS — Z87891 Personal history of nicotine dependence: Secondary | ICD-10-CM

## 2017-03-23 DIAGNOSIS — E162 Hypoglycemia, unspecified: Secondary | ICD-10-CM

## 2017-03-23 DIAGNOSIS — Z888 Allergy status to other drugs, medicaments and biological substances status: Secondary | ICD-10-CM | POA: Diagnosis not present

## 2017-03-23 DIAGNOSIS — L0390 Cellulitis, unspecified: Secondary | ICD-10-CM | POA: Diagnosis not present

## 2017-03-23 DIAGNOSIS — Z8701 Personal history of pneumonia (recurrent): Secondary | ICD-10-CM

## 2017-03-23 DIAGNOSIS — I878 Other specified disorders of veins: Secondary | ICD-10-CM | POA: Diagnosis present

## 2017-03-23 DIAGNOSIS — Z89421 Acquired absence of other right toe(s): Secondary | ICD-10-CM

## 2017-03-23 DIAGNOSIS — I10 Essential (primary) hypertension: Secondary | ICD-10-CM | POA: Diagnosis not present

## 2017-03-23 DIAGNOSIS — E119 Type 2 diabetes mellitus without complications: Secondary | ICD-10-CM

## 2017-03-23 DIAGNOSIS — D631 Anemia in chronic kidney disease: Secondary | ICD-10-CM | POA: Diagnosis present

## 2017-03-23 DIAGNOSIS — L03119 Cellulitis of unspecified part of limb: Secondary | ICD-10-CM | POA: Diagnosis not present

## 2017-03-23 DIAGNOSIS — Z79899 Other long term (current) drug therapy: Secondary | ICD-10-CM | POA: Diagnosis not present

## 2017-03-23 DIAGNOSIS — Z89422 Acquired absence of other left toe(s): Secondary | ICD-10-CM | POA: Diagnosis not present

## 2017-03-23 DIAGNOSIS — I120 Hypertensive chronic kidney disease with stage 5 chronic kidney disease or end stage renal disease: Secondary | ICD-10-CM | POA: Diagnosis present

## 2017-03-23 DIAGNOSIS — R1310 Dysphagia, unspecified: Secondary | ICD-10-CM | POA: Diagnosis present

## 2017-03-23 DIAGNOSIS — Z794 Long term (current) use of insulin: Secondary | ICD-10-CM | POA: Diagnosis not present

## 2017-03-23 DIAGNOSIS — Z961 Presence of intraocular lens: Secondary | ICD-10-CM | POA: Diagnosis present

## 2017-03-23 DIAGNOSIS — F329 Major depressive disorder, single episode, unspecified: Secondary | ICD-10-CM | POA: Diagnosis present

## 2017-03-23 DIAGNOSIS — Z7952 Long term (current) use of systemic steroids: Secondary | ICD-10-CM | POA: Diagnosis not present

## 2017-03-23 DIAGNOSIS — D638 Anemia in other chronic diseases classified elsewhere: Secondary | ICD-10-CM | POA: Diagnosis present

## 2017-03-23 DIAGNOSIS — N186 End stage renal disease: Secondary | ICD-10-CM | POA: Diagnosis present

## 2017-03-23 DIAGNOSIS — E039 Hypothyroidism, unspecified: Secondary | ICD-10-CM | POA: Diagnosis present

## 2017-03-23 DIAGNOSIS — L03115 Cellulitis of right lower limb: Principal | ICD-10-CM | POA: Diagnosis present

## 2017-03-23 DIAGNOSIS — N179 Acute kidney failure, unspecified: Secondary | ICD-10-CM | POA: Diagnosis present

## 2017-03-23 DIAGNOSIS — E11649 Type 2 diabetes mellitus with hypoglycemia without coma: Secondary | ICD-10-CM | POA: Diagnosis present

## 2017-03-23 DIAGNOSIS — Z7982 Long term (current) use of aspirin: Secondary | ICD-10-CM

## 2017-03-23 DIAGNOSIS — Z91048 Other nonmedicinal substance allergy status: Secondary | ICD-10-CM | POA: Diagnosis not present

## 2017-03-23 DIAGNOSIS — Z940 Kidney transplant status: Secondary | ICD-10-CM

## 2017-03-23 DIAGNOSIS — K219 Gastro-esophageal reflux disease without esophagitis: Secondary | ICD-10-CM | POA: Diagnosis present

## 2017-03-23 DIAGNOSIS — R52 Pain, unspecified: Secondary | ICD-10-CM

## 2017-03-23 LAB — CBC WITH DIFFERENTIAL/PLATELET
BASOS ABS: 0 10*3/uL (ref 0.0–0.1)
Basophils Relative: 0 %
EOS ABS: 0.1 10*3/uL (ref 0.0–0.7)
EOS PCT: 2 %
HCT: 32.3 % — ABNORMAL LOW (ref 36.0–46.0)
Hemoglobin: 10.2 g/dL — ABNORMAL LOW (ref 12.0–15.0)
Lymphocytes Relative: 20 %
Lymphs Abs: 1 10*3/uL (ref 0.7–4.0)
MCH: 28.3 pg (ref 26.0–34.0)
MCHC: 31.6 g/dL (ref 30.0–36.0)
MCV: 89.7 fL (ref 78.0–100.0)
Monocytes Absolute: 0.5 10*3/uL (ref 0.1–1.0)
Monocytes Relative: 10 %
Neutro Abs: 3.3 10*3/uL (ref 1.7–7.7)
Neutrophils Relative %: 68 %
PLATELETS: 214 10*3/uL (ref 150–400)
RBC: 3.6 MIL/uL — AB (ref 3.87–5.11)
RDW: 14.4 % (ref 11.5–15.5)
WBC: 5 10*3/uL (ref 4.0–10.5)

## 2017-03-23 LAB — COMPREHENSIVE METABOLIC PANEL
ALT: 11 U/L — AB (ref 14–54)
AST: 14 U/L — AB (ref 15–41)
Albumin: 2.7 g/dL — ABNORMAL LOW (ref 3.5–5.0)
Alkaline Phosphatase: 110 U/L (ref 38–126)
Anion gap: 8 (ref 5–15)
BUN: 23 mg/dL — AB (ref 6–20)
CHLORIDE: 110 mmol/L (ref 101–111)
CO2: 23 mmol/L (ref 22–32)
CREATININE: 1.35 mg/dL — AB (ref 0.44–1.00)
Calcium: 9.6 mg/dL (ref 8.9–10.3)
GFR calc non Af Amer: 45 mL/min — ABNORMAL LOW (ref >60)
GFR, EST AFRICAN AMERICAN: 52 mL/min — AB (ref >60)
Glucose, Bld: 118 mg/dL — ABNORMAL HIGH (ref 65–99)
Potassium: 3.8 mmol/L (ref 3.5–5.1)
SODIUM: 141 mmol/L (ref 135–145)
Total Bilirubin: 0.3 mg/dL (ref 0.3–1.2)
Total Protein: 6.5 g/dL (ref 6.5–8.1)

## 2017-03-23 LAB — BRAIN NATRIURETIC PEPTIDE: B Natriuretic Peptide: 261.5 pg/mL — ABNORMAL HIGH (ref 0.0–100.0)

## 2017-03-23 LAB — CBG MONITORING, ED
GLUCOSE-CAPILLARY: 109 mg/dL — AB (ref 65–99)
Glucose-Capillary: 115 mg/dL — ABNORMAL HIGH (ref 65–99)

## 2017-03-23 LAB — I-STAT CG4 LACTIC ACID, ED
LACTIC ACID, VENOUS: 1.23 mmol/L (ref 0.5–1.9)
Lactic Acid, Venous: 1.91 mmol/L — ABNORMAL HIGH (ref 0.5–1.9)

## 2017-03-23 LAB — MAGNESIUM: Magnesium: 1.6 mg/dL — ABNORMAL LOW (ref 1.7–2.4)

## 2017-03-23 LAB — GLUCOSE, CAPILLARY: Glucose-Capillary: 85 mg/dL (ref 65–99)

## 2017-03-23 MED ORDER — FAMOTIDINE 20 MG PO TABS
20.0000 mg | ORAL_TABLET | Freq: Every day | ORAL | Status: DC
  Administered 2017-03-24 (×2): 20 mg via ORAL
  Filled 2017-03-23 (×2): qty 1

## 2017-03-23 MED ORDER — HYDROMORPHONE HCL 1 MG/ML IJ SOLN: 1.0000 mg | Freq: Once | INTRAMUSCULAR | Status: DC

## 2017-03-23 MED ORDER — OXYCODONE HCL 5 MG PO TABS
5.0000 mg | ORAL_TABLET | ORAL | Status: DC | PRN
  Administered 2017-03-24 – 2017-03-25 (×4): 5 mg via ORAL
  Filled 2017-03-23 (×4): qty 1

## 2017-03-23 MED ORDER — INSULIN ASPART PROT & ASPART (70-30 MIX) 100 UNIT/ML ~~LOC~~ SUSP
40.0000 [IU] | Freq: Two times a day (BID) | SUBCUTANEOUS | Status: DC
  Filled 2017-03-23: qty 10

## 2017-03-23 MED ORDER — TACROLIMUS 0.5 MG PO CAPS
1.0000 mg | ORAL_CAPSULE | Freq: Every day | ORAL | Status: DC
  Administered 2017-03-24 – 2017-03-28 (×6): 1 mg via ORAL
  Filled 2017-03-23 (×6): qty 2

## 2017-03-23 MED ORDER — CEFAZOLIN SODIUM-DEXTROSE 1-4 GM/50ML-% IV SOLN
1.0000 g | Freq: Once | INTRAVENOUS | Status: DC
Start: 2017-03-23 — End: 2017-03-23
  Filled 2017-03-23 (×2): qty 50

## 2017-03-23 MED ORDER — ACETAMINOPHEN 500 MG PO TABS
500.0000 mg | ORAL_TABLET | Freq: Four times a day (QID) | ORAL | Status: DC | PRN
  Administered 2017-03-26 – 2017-03-27 (×2): 500 mg via ORAL
  Filled 2017-03-23 (×2): qty 1

## 2017-03-23 MED ORDER — DEXTROSE 5 % IV SOLN
1.0000 g | INTRAVENOUS | Status: DC
  Administered 2017-03-24 – 2017-03-27 (×5): 1 g via INTRAVENOUS
  Filled 2017-03-23 (×5): qty 10

## 2017-03-23 MED ORDER — HYDROMORPHONE HCL-NACL 0.5-0.9 MG/ML-% IV SOSY
1.0000 mg | PREFILLED_SYRINGE | Freq: Once | INTRAVENOUS | Status: AC
  Administered 2017-03-24: 1 mg via INTRAVENOUS
  Filled 2017-03-23: qty 2

## 2017-03-23 MED ORDER — LEVOTHYROXINE SODIUM 25 MCG PO TABS
137.0000 ug | ORAL_TABLET | Freq: Every day | ORAL | Status: DC
  Administered 2017-03-24 – 2017-03-29 (×6): 137 ug via ORAL
  Filled 2017-03-23 (×6): qty 1

## 2017-03-23 MED ORDER — ONDANSETRON HCL 4 MG/2ML IJ SOLN
4.0000 mg | Freq: Four times a day (QID) | INTRAMUSCULAR | Status: DC | PRN
  Administered 2017-03-25 – 2017-03-27 (×2): 4 mg via INTRAVENOUS
  Filled 2017-03-23 (×2): qty 2

## 2017-03-23 MED ORDER — FENTANYL CITRATE (PF) 100 MCG/2ML IJ SOLN
50.0000 ug | Freq: Once | INTRAMUSCULAR | Status: AC
  Administered 2017-03-23: 50 ug via INTRAVENOUS
  Filled 2017-03-23: qty 2

## 2017-03-23 MED ORDER — AMLODIPINE BESYLATE 10 MG PO TABS
10.0000 mg | ORAL_TABLET | Freq: Every day | ORAL | Status: DC
  Administered 2017-03-24 – 2017-03-29 (×6): 10 mg via ORAL
  Filled 2017-03-23 (×6): qty 1

## 2017-03-23 MED ORDER — ASPIRIN EC 81 MG PO TBEC
81.0000 mg | DELAYED_RELEASE_TABLET | Freq: Every day | ORAL | Status: DC
  Administered 2017-03-24 – 2017-03-29 (×6): 81 mg via ORAL
  Filled 2017-03-23 (×6): qty 1

## 2017-03-23 MED ORDER — TACROLIMUS 0.5 MG PO CAPS
0.5000 mg | ORAL_CAPSULE | Freq: Every day | ORAL | Status: DC
  Administered 2017-03-24 – 2017-03-29 (×6): 0.5 mg via ORAL
  Filled 2017-03-23 (×6): qty 1

## 2017-03-23 MED ORDER — MAGNESIUM SULFATE 2 GM/50ML IV SOLN
2.0000 g | Freq: Once | INTRAVENOUS | Status: AC
  Administered 2017-03-23: 2 g via INTRAVENOUS
  Filled 2017-03-23: qty 50

## 2017-03-23 MED ORDER — CARVEDILOL 25 MG PO TABS
50.0000 mg | ORAL_TABLET | Freq: Two times a day (BID) | ORAL | Status: DC
  Administered 2017-03-24 – 2017-03-29 (×12): 50 mg via ORAL
  Filled 2017-03-23 (×12): qty 2

## 2017-03-23 MED ORDER — PREDNISONE 5 MG PO TABS
5.0000 mg | ORAL_TABLET | Freq: Every day | ORAL | Status: DC
  Administered 2017-03-24 – 2017-03-28 (×6): 5 mg via ORAL
  Filled 2017-03-23 (×6): qty 1

## 2017-03-23 MED ORDER — MYCOPHENOLATE MOFETIL 250 MG PO CAPS
500.0000 mg | ORAL_CAPSULE | Freq: Two times a day (BID) | ORAL | Status: DC
  Administered 2017-03-24 – 2017-03-29 (×12): 500 mg via ORAL
  Filled 2017-03-23 (×12): qty 2

## 2017-03-23 MED ORDER — ENOXAPARIN SODIUM 40 MG/0.4ML ~~LOC~~ SOLN
40.0000 mg | SUBCUTANEOUS | Status: DC
  Administered 2017-03-24 – 2017-03-28 (×6): 40 mg via SUBCUTANEOUS
  Filled 2017-03-23 (×7): qty 0.4

## 2017-03-23 NOTE — ED Notes (Signed)
Bed: WA09 Expected date:  Expected time:  Means of arrival:  Comments: EMS-LE infection

## 2017-03-23 NOTE — ED Notes (Signed)
Pt understood dc material. NAD Noted. Script given at dc 

## 2017-03-23 NOTE — H&P (Signed)
History and Physical    [name]:[id number] DOB: [birth year] DOA: 03/23/2017  PCP: Dois Davenport, MD   Patient coming from: Home.  I have personally briefly reviewed patient's old medical records in [facility]  Chief Complaint: Hyperglycemia.  HPI: [name] is a 51 y.o. female with medical history significant of osteoarthritis, chronic kidney disease, headaches, depression, hypertension, hypothyroidism, kidney transplant, immunosuppression due to transplant medications, history of pneumonia, insulin requiring type 2 diabetes who is coming to the emergency department via EMS after becoming hypoglycemic with a blood glucose in the 30s at home. She also complains of recurrent bilateral lower extremities infection. She was seen yesterday at [facility] emergency department for mild hypoglycemia, was also diagnosed with cellulitis and discharged home on oral antibiotics. She denies fever, chills, earaches, sore throat or rhinorrhea. However she complains of dizziness, frontal headaches and nasal congestion. She denies chest pain, palpitations, dyspnea, diaphoresis, pitting edema of the lower extremities PND or orthopnea. Denies abdominal pain, nausea, emesis, diarrhea, constipation, melena or hematochezia. She denies dysuria or hematuria.   ED Course: Her glucose improved with oral intake. Her initial vital signs temperature 98.88F, pulse 76, blood pressure 141/79 mmHg, respirations 16 and O2 sat 97% on room air. WBC was 5, hemoglobin 10.2 g/dL and platelets 811. Her BNP was 261 pg/mL. Sodium 141, potassium 3.8, chloride 110, bicarbonate 23 mmol/L. BUN was 20, creatinine 1.35 and glucose 118 mg/dL. Her LFTs shows hypomagnesemia 2.7 g/dL and low transaminases level. Chest radiograph shows bronchitic changes. She was given a dose of cefazolin in the emergency department.  Review of Systems: As per HPI otherwise 10 point review of systems negative.    Past Medical History:  Diagnosis  Date  . Arthritis    "elbows, knees, legs, back" (09/24/2015)  . CKD (chronic kidney disease)   . Daily headache   . Depression    "years ago"  . End stage renal disease (HCC)    right arm AV graft, post transplant  . Hypertension   . Hypothyroid   . 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 Kidney disease   . Pneumonia ~ 2007?  [name] Type II diabetes mellitus (HCC)    Insulin dependant    Past Surgical History:  Procedure Laterality Date  . AMPUTATION Left 05/11/2014   Procedure: AMPUTATION LEFT GREAT TOE;  Surgeon: Kathryne Hitch, MD;  Location: [facility] ORS;  Service: Orthopedics;  Laterality: Left;  . AV FISTULA PLACEMENT Right    forearm  . BACK SURGERY    . CATARACT EXTRACTION W/ INTRAOCULAR LENS  IMPLANT, BILATERAL Bilateral   . CESAREAN SECTION  07/1999  . DG AV DIALYSIS GRAFT DECLOT OR    . INCISION AND DRAINAGE Right 06/02/2015   Procedure: INCISION AND DRAINAGE OF RIGHT 3rd RAY RESECTION;  Surgeon: Kathryne Hitch, MD;  Location: [facility] OR;  Service: Orthopedics;  Laterality: Right;  . 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 LUMBAR DISC SURGERY  2001  . NEPHRECTOMY TRANSPLANTED ORGAN    . TOE AMPUTATION Right    1,2 & 3rd toes.  . TUBAL LIGATION  07/1999     reports that she quit smoking about 18 years ago. Her smoking use included Cigarettes. She has a 11.00 pack-year smoking history. She has never used smokeless tobacco. She reports that she does not drink alcohol or use drugs.  Allergies  Allergen Reactions  . Ace Inhibitors Cough  . Adhesive [Tape] Itching and Other (  See Comments)    Please use paper tape  . Lisinopril Cough    Family History  Problem Relation Age of Onset  . Emphysema Mother   . Throat cancer Mother   . COPD Mother   . Cancer Mother   . Emphysema Father   . COPD Father   . Stroke Father   . ADD / ADHD Son    Unacceptable: Noncontributory, unremarkable, or negative. Acceptable: Family history reviewed and  not pertinent (If you reviewed it)  Prior to Admission medications   Medication Sig Start Date End Date Taking? Authorizing Provider  acetaminophen (TYLENOL) 500 MG tablet Take 500 mg by mouth every 6 (six) hours as needed for moderate pain.   Yes [provider]  amLODipine (NORVASC) 10 MG tablet Take 1 tablet (10 mg total) by mouth daily. 08/14/16  Yes Regalado, Belkys A, MD  aspirin EC 81 MG tablet Take 81 mg by mouth daily.   Yes [provider]  carvedilol (COREG) 25 MG tablet Take 50 mg by mouth 2 (two) times daily. 02/26/17  Yes [provider]  insulin NPH-regular Human (NOVOLIN 70/30) (70-30) 100 UNIT/ML injection Inject 40 Units into the skin 2 (two) times daily with a meal. 03/03/17  Yes Hongalgi, Maximino Greenland, MD  levothyroxine (SYNTHROID, LEVOTHROID) 137 MCG tablet Take 1 tablet (137 mcg total) by mouth daily. BRAND NAME ONLY 03/04/15  Yes Alison Murray, MD  mycophenolate (CELLCEPT) 250 MG capsule Take 500 mg by mouth 2 (two) times daily.    Yes [provider]  predniSONE (DELTASONE) 5 MG tablet Take 5 mg by mouth at bedtime.  05/29/15  Yes [provider]  ranitidine (ZANTAC) 150 MG tablet Take 150 mg by mouth at bedtime.  03/27/16  Yes [provider]  tacrolimus (PROGRAF) 0.5 MG capsule Take 0.5-1 mg by mouth 2 (two) times daily. Take 0.5mg  by mouth in the morning and 1mg  by mouth at bedtime   Yes [provider]  acetaminophen (TYLENOL) 325 MG tablet Take 2 tablets (650 mg total) by mouth every 6 (six) hours as needed for mild pain, moderate pain, fever or headache. Patient not taking: Reported on 03/23/2017 05/15/16   Elease Etienne, MD  cephALEXin (KEFLEX) 500 MG capsule Take 1 capsule (500 mg total) by mouth 2 (two) times daily. 03/22/17 03/29/17  Tegeler, Canary Brim, MD    Physical Exam: Vitals:   03/23/17 1825 03/23/17 1834 03/23/17 2151 03/23/17 2319  BP:  (!) 141/79 (!) 145/76 (!) 158/79  Pulse:  76 71 72  Resp:   16 11 15   Temp:  98.9 F (37.2 C)  97.7 F (36.5 C)  TempSrc:  Oral  Oral  SpO2: 97% 96% 90% 96%    Constitutional: NAD, calm, comfortable Eyes: PERRL, lids and conjunctivae normal ENMT: Mucous membranes are moist. Posterior pharynx clear of any exudate or lesions.Normal dentition.  Neck: normal, supple, no masses, no thyromegaly Respiratory: clear to auscultation bilaterally, no wheezing, no crackles. Normal respiratory effort. No accessory muscle use.  Cardiovascular: Regular rate and rhythm, positive 2/6 diastolic murmur, no rubs / gallops. 1+ lower extremity edema. 2+ pedal pulses. No carotid bruits.  Abdomen: no tenderness, no masses palpated. No hepatosplenomegaly. Bowel sounds positive.  Musculoskeletal: no clubbing / cyanosis. First, second and third right toe amputation. . Good ROM, no contractures. Normal muscle tone.  Skin: Bilateral pretibial erythema R>L. Neurologic: CN 2-12 grossly intact. Sensation intact, DTR normal. Strength 5/5 in all 4.  Psychiatric: Normal  judgment and insight. Alert and oriented x 3. Normal mood.    Labs on Admission: I have personally reviewed following labs and imaging studies  CBC:  Recent Labs Lab 03/22/17 1703 03/23/17 1908  WBC 4.4 5.0  NEUTROABS 2.5 3.3  HGB 9.7* 10.2*  HCT 31.0* 32.3*  MCV 89.1 89.7  PLT 216 214   Basic Metabolic Panel:  Recent Labs Lab 03/22/17 1703 03/23/17 1908  NA 138 141  K 3.8 3.8  CL 109 110  CO2 23 23  GLUCOSE 94 118*  BUN 20 23*  CREATININE 1.47* 1.35*  CALCIUM 9.3 9.6  MG  --  1.6*   GFR: CrCl cannot be calculated (Unknown ideal weight.). Liver Function Tests:  Recent Labs Lab 03/22/17 1703 03/23/17 1908  AST 13* 14*  ALT 8* 11*  ALKPHOS 99 110  BILITOT 0.3 0.3  PROT 5.8* 6.5  ALBUMIN 2.4* 2.7*   No results for input(s): LIPASE, AMYLASE in the last 168 hours. No results for input(s): AMMONIA in the last 168 hours. Coagulation Profile: No results for input(s): INR, PROTIME in  the last 168 hours. Cardiac Enzymes: No results for input(s): CKTOTAL, CKMB, CKMBINDEX, TROPONINI in the last 168 hours. BNP (last 3 results) No results for input(s): PROBNP in the last 8760 hours. HbA1C: No results for input(s): HGBA1C in the last 72 hours. CBG:  Recent Labs Lab 03/22/17 2216 03/22/17 2306 03/23/17 1912 03/23/17 2204 03/23/17 2317  GLUCAP 43* 72 109* 115* 85   Lipid Profile: No results for input(s): CHOL, HDL, LDLCALC, TRIG, CHOLHDL, LDLDIRECT in the last 72 hours. Thyroid Function Tests: No results for input(s): TSH, T4TOTAL, FREET4, T3FREE, THYROIDAB in the last 72 hours. Anemia Panel: No results for input(s): VITAMINB12, FOLATE, FERRITIN, TIBC, IRON, RETICCTPCT in the last 72 hours. Urine analysis:    Component Value Date/Time   COLORURINE STRAW (A) 02/28/2017 2141   APPEARANCEUR CLEAR 02/28/2017 2141   LABSPEC 1.018 02/28/2017 2141   PHURINE 5.0 02/28/2017 2141   GLUCOSEU >=500 (A) 02/28/2017 2141   HGBUR SMALL (A) 02/28/2017 2141   BILIRUBINUR NEGATIVE 02/28/2017 2141   KETONESUR 80 (A) 02/28/2017 2141   PROTEINUR 100 (A) 02/28/2017 2141   UROBILINOGEN 0.2 03/02/2015 1153   NITRITE NEGATIVE 02/28/2017 2141   LEUKOCYTESUR NEGATIVE 02/28/2017 2141    Radiological Exams on Admission: Dg Chest 2 View  Result Date: 03/23/2017 CLINICAL DATA:  Shortness of breath EXAM: CHEST  2 VIEW COMPARISON:  03/01/2017 FINDINGS: Interstitial coarsening that is favored bronchitic. No Kerley lines or air bronchogram. No effusion or pneumothorax. Normal heart size and aortic contours. IMPRESSION: Bronchitic type markings without collapse or consolidation. Electronically Signed   By: Marnee Spring M.D.   On: 03/23/2017 19:36    EKG: Independently reviewed. Vent. rate 77 BPM PR interval * ms QRS duration 133 ms QT/QTc 441/500 ms P-R-T axes 35 139 120 Sinus rhythm RBBB and LPFB Abnormal T, consider ischemia, lateral leads Baseline wander in lead(s) III  V3  Assessment/Plan Principal Problem:   Lower extremity cellulitis Admit to MedSurg/inpatient. Ceftriaxone 1 g every 24 hours. Analgesics as needed.  Active Problems:   Type II diabetes mellitus (HCC)    Hypoglycemia Hold insulin tonight. Monitor CBG. Hypoglycemia protocol ordered.    Hypomagnesemia Replaced with magnesium sulfate 2 g IVPB 1 dose.    Hypothyroidism Continue levothyroxine 137 g by mouth daily. Monitor TSH periodically.    Essential hypertension Continue amlodipine 10 mg by mouth daily. Continue carvedilol 12.5 mg by mouth twice a  day. Monitor blood pressure.    Anemia Follow-up hematocrit and hemoglobin in a.m.    H/O kidney transplant Continue mycophenolate 500 mg by mouth twice a day. Continue prednisone 5 mg by mouth at bedtime. Continue tacrolimus 0.5-1 mg by mouth twice a day.    GERD (gastroesophageal reflux disease). Pantoprazole 40 mg by mouth daily       DVT prophylaxis: Lovenox SQ. Code Status: Full code. Family Communication:  Disposition Plan: Admit for IV antibiotics for 2-3 days. Consults called:  Admission status: Inpatient/telemetry.   Bobette Moavid Manuel Symia Herdt MD [facility] Pager [phone].  If 7PM-7AM, please contact night-coverage [email or web address] Password [email or web address]  03/23/2017, 11:22 PM

## 2017-03-23 NOTE — ED Triage Notes (Addendum)
Pt from home via EMS with c/o hypoglycemia. Pt initially had a reading in the 30's when EMS arrived on the scene. Pt ate peanut butter at the time and was able to bring her CBG up to 147. Pt also has complaints of lower leg infection in bilateral legs. [facility] and was diagnosed with cellulitis. Pt has white large scarred areas from previous ulcers.

## 2017-03-23 NOTE — ED Provider Notes (Signed)
[facility]-EMERGENCY DEPT Provider Note   CSN: [id number] Arrival date & time: 03/23/17  1819     History   Chief Complaint Chief Complaint  Patient presents with  . Hypoglycemia  . Recurrent Skin Infections    HPI [name] is a 51 y.o. female.  HPI    51 yo F with PMHx of HTN, CKD s/p renal transplant, T2DM, here with hypoglycemia, leg pain/swelling. Pt reports several days of progressively worsening bilateral leg swelling, R>L. She was seen in ED yesterday with reassuring labs, placed on Keflex and discharged home. She had some mild hypoglycemia during ED visit that resolved with eating. She states that since then, she has had worsening of redness/pain in right leg. She states that she felt unwell earlier today and checked her BG, which was low. She then reportedly became less responsive and was found to have BG in the 30s by her son. EMS called, pt given carbs with improvement. She states she has had worsening redness/pain in her leg since discharge from ED. She states her BG has also been highly variable and she has felt generally unwell, but no fevers. No AMS. No weakness or numbness. She has not filled her ABX.  Past Medical History:  Diagnosis Date  . Arthritis    "elbows, knees, legs, back" (09/24/2015)  . CKD (chronic kidney disease)   . Daily headache   . Depression    "years ago"  . End stage renal disease (HCC)    right arm AV graft, post transplant  . Hypertension   . Hypothyroid   . 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 Kidney disease   . Pneumonia ~ 2007?  [name] Type II diabetes mellitus (HCC)    Insulin dependant    Patient Active Problem List   Diagnosis Date Noted  . Lower extremity cellulitis 03/23/2017  . Type II diabetes mellitus (HCC) 03/23/2017  . Anemia 01/26/2017  . Abnormal LFTs 01/26/2017  . DKA (diabetic ketoacidoses) (HCC) 08/12/2016  . Diabetic acidosis without coma (HCC)   . DKA, type 2 (HCC) 07/21/2016  .  Hypoglycemia   . Wound cellulitis   . CKD (chronic kidney disease) stage 3, GFR 30-59 ml/min 06/26/2016  . Cellulitis 06/26/2016  . Cellulitis of right lower extremity   . Hyperglycemia   . QT prolongation 05/13/2016  . UTI (lower urinary tract infection)   . Hypomagnesemia   . Pressure ulcer 04/11/2016  . Acute encephalopathy 04/11/2016  . Altered mental state 04/11/2016  . Altered mental status 04/10/2016  . Insulin dependent diabetes mellitus (HCC) 04/10/2016  . Diabetic ketoacidosis without coma associated with type 2 diabetes mellitus (HCC) 11/02/2015  . Hyperglycemia without ketosis 09/24/2015  . CKD (chronic kidney disease) stage 2, GFR 60-89 ml/min 09/24/2015  . History of renal transplant 09/24/2015  . Uncontrolled type 1 diabetes mellitus with foot ulcer (HCC) 06/04/2015  . History of renal transplantation 06/04/2015  . Foot ulcer, right (HCC)   . Foot abscess, right   . Diarrhea 06/02/2015  . DM (diabetes mellitus) type 2, uncontrolled, with ketoacidosis (HCC) 06/02/2015  . Type 1 diabetes mellitus with diabetic foot ulcer (HCC) 06/02/2015  . Acute renal failure superimposed on stage 3 chronic kidney disease (HCC) 06/02/2015  . Diabetic foot infection (HCC) 06/01/2015  . Essential hypertension 06/01/2015  . GERD (gastroesophageal reflux disease) 06/01/2015  . AKI (acute kidney injury) (HCC) 01/02/2015  . Hypotension 03/22/2014  . End stage renal disease (HCC) 10/24/2012  . H/O  kidney transplant 08/04/2012  . High anion gap metabolic acidosis 08/04/2012  . Sepsis (HCC) 08/04/2012  . DKA, type 1 (HCC) 09/22/2011  . Hyperkalemia 09/22/2011  . Hyponatremia 09/22/2011  . Immunosuppression (HCC)   . Hypothyroidism     Past Surgical History:  Procedure Laterality Date  . AMPUTATION Left 05/11/2014   Procedure: AMPUTATION LEFT GREAT TOE;  Surgeon: Kathryne Hitch, MD;  Location: [facility]S;  Service: Orthopedics;  Laterality: Left;  . AV FISTULA PLACEMENT Right     forearm  . BACK SURGERY    . CATARACT EXTRACTION W/ INTRAOCULAR LENS  IMPLANT, BILATERAL Bilateral   . CESAREAN SECTION  07/1999  . DG AV DIALYSIS GRAFT DECLOT OR    . INCISION AND DRAINAGE Right 06/02/2015   Procedure: INCISION AND DRAINAGE OF RIGHT 3rd RAY RESECTION;  Surgeon: Kathryne Hitch, MD;  Location: [facility];  Service: Orthopedics;  Laterality: Right;  . 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 LUMBAR DISC SURGERY  2001  . NEPHRECTOMY TRANSPLANTED ORGAN    . TOE AMPUTATION Right    1,2 & 3rd toes.  . TUBAL LIGATION  07/1999    OB History    No data available       Home Medications    Prior to Admission medications   Medication Sig Start Date End Date Taking? Authorizing Provider  acetaminophen (TYLENOL) 500 MG tablet Take 500 mg by mouth every 6 (six) hours as needed for moderate pain.   Yes [provider]  amLODipine (NORVASC) 10 MG tablet Take 1 tablet (10 mg total) by mouth daily. 08/14/16  Yes Regalado, Belkys A, MD  aspirin EC 81 MG tablet Take 81 mg by mouth daily.   Yes [provider]  carvedilol (COREG) 25 MG tablet Take 50 mg by mouth 2 (two) times daily. 02/26/17  Yes [provider]  insulin NPH-regular Human (NOVOLIN 70/30) (70-30) 100 UNIT/ML injection Inject 40 Units into the skin 2 (two) times daily with a meal. 03/03/17  Yes Hongalgi, Maximino Greenland, MD  levothyroxine (SYNTHROID, LEVOTHROID) 137 MCG tablet Take 1 tablet (137 mcg total) by mouth daily. BRAND NAME ONLY 03/04/15  Yes Alison Murray, MD  mycophenolate (CELLCEPT) 250 MG capsule Take 500 mg by mouth 2 (two) times daily.    Yes [provider]  predniSONE (DELTASONE) 5 MG tablet Take 5 mg by mouth at bedtime.  05/29/15  Yes [provider]  ranitidine (ZANTAC) 150 MG tablet Take 150 mg by mouth at bedtime.  03/27/16  Yes [provider]  tacrolimus (PROGRAF) 0.5 MG capsule Take 0.5-1 mg by mouth 2 (two) times daily. Take 0.5mg  by mouth in the morning  and 1mg  by mouth at bedtime   Yes [provider]  acetaminophen (TYLENOL) 325 MG tablet Take 2 tablets (650 mg total) by mouth every 6 (six) hours as needed for mild pain, moderate pain, fever or headache. Patient not taking: Reported on 03/23/2017 05/15/16   Elease Etienne, MD  cephALEXin (KEFLEX) 500 MG capsule Take 1 capsule (500 mg total) by mouth 2 (two) times daily. 03/22/17 03/29/17  Tegeler, Canary Brim, MD    Family History Family History  Problem Relation Age of Onset  . Emphysema Mother   . Throat cancer Mother   . COPD Mother   . Cancer Mother   . Emphysema Father   . COPD Father   . Stroke Father   . ADD / ADHD Son     Social  History Social History  Substance Use Topics  . Smoking status: Former Smoker    Packs/day: 1.00    Years: 11.00    Types: Cigarettes    Quit date: 09/21/1998  . Smokeless tobacco: Never Used  . Alcohol use No     Allergies   Ace inhibitors; Adhesive [tape]; and Lisinopril   Review of Systems Review of Systems  Constitutional: Positive for chills and fatigue.  Cardiovascular: Positive for leg swelling.  Skin: Positive for rash and wound.  Neurological: Positive for weakness.  All other systems reviewed and are negative.    Physical Exam Updated Vital Signs BP (!) 158/79 (BP Location: Right Arm)   Pulse 72   Temp 97.7 F (36.5 C) (Oral)   Resp 15   LMP 11/19/2011   SpO2 96%   Physical Exam  Constitutional: She is oriented to person, place, and time. She appears well-developed and well-nourished. No distress.  HENT:  Head: Normocephalic and atraumatic.  Mouth/Throat: Oropharynx is clear and moist.  Eyes: Conjunctivae are normal.  Neck: Neck supple.  Cardiovascular: Normal rate, regular rhythm and normal heart sounds.  Exam reveals no friction rub.   No murmur heard. Pulmonary/Chest: Effort normal and breath sounds normal. No respiratory distress. She has no wheezes. She has no rales.  Abdominal: She exhibits  no distension.  Musculoskeletal: She exhibits edema.  Neurological: She is alert and oriented to person, place, and time. She exhibits normal muscle tone.  Skin: Skin is warm. Capillary refill takes less than 2 seconds.  Psychiatric: She has a normal mood and affect.  Nursing note and vitals reviewed.   LOWER EXTREMITY EXAM: BILATERAL  INSPECTION & PALPATION: 3+ pitting edema to bilateral LE, R>L. Mild erythema of RLE extending from mid anterior shin to proximal lower leg. No fluctuance.  SENSORY: sensation is intact to light touch in:  Superficial peroneal nerve distribution (over dorsum of foot) Deep peroneal nerve distribution (over first dorsal web space) Sural nerve distribution (over lateral aspect 5th metatarsal) Saphenous nerve distribution (over medial instep)  MOTOR:  + Motor EHL (great toe dorsiflexion) + FHL (great toe plantar flexion)  + TA (ankle dorsiflexion)  + GSC (ankle plantar flexion)  VASCULAR: 2+ dorsalis pedis and posterior tibialis pulses Capillary refill < 2 sec, toes warm and well-perfused  COMPARTMENTS: Soft, warm, well-perfused No pain with passive extension No parethesias   ED Treatments / Results  Labs (all labs ordered are listed, but only abnormal results are displayed) Labs Reviewed  MRSA PCR SCREENING - Abnormal; Notable for the following:       Result Value   MRSA by PCR POSITIVE (*)    All other components within normal limits  CBC WITH DIFFERENTIAL/PLATELET - Abnormal; Notable for the following:    RBC 3.60 (*)    Hemoglobin 10.2 (*)    HCT 32.3 (*)    All other components within normal limits  COMPREHENSIVE METABOLIC PANEL - Abnormal; Notable for the following:    Glucose, Bld 118 (*)    BUN 23 (*)    Creatinine, Ser 1.35 (*)    Albumin 2.7 (*)    AST 14 (*)    ALT 11 (*)    GFR calc non Af Amer 45 (*)    GFR calc Af Amer 52 (*)    All other components within normal limits  BRAIN NATRIURETIC PEPTIDE - Abnormal; Notable  for the following:    B Natriuretic Peptide 261.5 (*)    All other components within  normal limits  MAGNESIUM - Abnormal; Notable for the following:    Magnesium 1.6 (*)    All other components within normal limits  CBG MONITORING, ED - Abnormal; Notable for the following:    Glucose-Capillary 109 (*)    All other components within normal limits  I-STAT CG4 LACTIC ACID, ED - Abnormal; Notable for the following:    Lactic Acid, Venous 1.91 (*)    All other components within normal limits  CBG MONITORING, ED - Abnormal; Notable for the following:    Glucose-Capillary 115 (*)    All other components within normal limits  CULTURE, BLOOD (ROUTINE X 2)  CULTURE, BLOOD (ROUTINE X 2)  GLUCOSE, CAPILLARY  CBC WITH DIFFERENTIAL/PLATELET  BASIC METABOLIC PANEL  I-STAT CG4 LACTIC ACID, ED    EKG  EKG Interpretation None       Radiology Dg Chest 2 View  Result Date: 03/23/2017 CLINICAL DATA:  Shortness of breath EXAM: CHEST  2 VIEW COMPARISON:  03/01/2017 FINDINGS: Interstitial coarsening that is favored bronchitic. No Kerley lines or air bronchogram. No effusion or pneumothorax. Normal heart size and aortic contours. IMPRESSION: Bronchitic type markings without collapse or consolidation. Electronically Signed   By: Marnee Spring M.D.   On: 03/23/2017 19:36    Procedures Procedures (including critical care time)  Medications Ordered in ED Medications  ondansetron (ZOFRAN) injection 4 mg (not administered)  amLODipine (NORVASC) tablet 10 mg (not administered)  carvedilol (COREG) tablet 50 mg (50 mg Oral Given 03/24/17 0035)  aspirin EC tablet 81 mg (not administered)  insulin aspart protamine- aspart (NOVOLOG MIX 70/30) injection 40 Units (not administered)  levothyroxine (SYNTHROID, LEVOTHROID) tablet 137 mcg (not administered)  famotidine (PEPCID) tablet 20 mg (20 mg Oral Given 03/24/17 0036)  tacrolimus (PROGRAF) capsule 1 mg (1 mg Oral Given 03/24/17 0038)  predniSONE  (DELTASONE) tablet 5 mg (5 mg Oral Given 03/24/17 0036)  mycophenolate (CELLCEPT) capsule 500 mg (500 mg Oral Given 03/24/17 0036)  acetaminophen (TYLENOL) tablet 500 mg (not administered)  cefTRIAXone (ROCEPHIN) 1 g in dextrose 5 % 50 mL IVPB (1 g Intravenous New Bag/Given 03/24/17 0035)  enoxaparin (LOVENOX) injection 40 mg (40 mg Subcutaneous Given 03/24/17 0036)  oxyCODONE (Oxy IR/ROXICODONE) immediate release tablet 5 mg (not administered)  tacrolimus (PROGRAF) capsule 0.5 mg (not administered)  fentaNYL (SUBLIMAZE) injection 50 mcg (50 mcg Intravenous Given 03/23/17 2027)  magnesium sulfate IVPB 2 g 50 mL (2 g Intravenous New Bag/Given 03/23/17 2332)  HYDROmorphone (DILAUDID) injection 1 mg (1 mg Intravenous Given 03/24/17 0037)     Initial Impression / Assessment and Plan / ED Course  I have reviewed the triage vital signs and the nursing notes.  Pertinent labs & imaging results that were available during my care of the patient were reviewed by me and considered in my medical decision making (see chart for details).     51 yo F with extensive PMHx as above including CKD s/p renal transplant, DM here with worsening RLE>LLE swelling/pain/erythema, and hypoglycemia. Labs as above, overall reassuring but positive for mild lactic acidosis. Exam as above. Concern for ongoing, worsening cellulitis in immunosuppresed pt with recurrent hypoglycemia. Will admit for ABX for nonpurulent cellulitis, monitoring of BG. Pt in agreement.  Final Clinical Impressions(s) / ED Diagnoses   Final diagnoses:  Cellulitis of right lower extremity  Hypoglycemia    New Prescriptions Current Discharge Medication List       Shaune Pollack, MD 03/24/17 [phone]

## 2017-03-24 ENCOUNTER — Inpatient Hospital Stay (HOSPITAL_COMMUNITY): Payer: Medicare Other

## 2017-03-24 DIAGNOSIS — L0390 Cellulitis, unspecified: Secondary | ICD-10-CM

## 2017-03-24 DIAGNOSIS — L03115 Cellulitis of right lower limb: Principal | ICD-10-CM

## 2017-03-24 LAB — GLUCOSE, CAPILLARY
GLUCOSE-CAPILLARY: 65 mg/dL (ref 65–99)
GLUCOSE-CAPILLARY: 135 mg/dL — AB (ref 65–99)
Glucose-Capillary: 152 mg/dL — ABNORMAL HIGH (ref 65–99)
Glucose-Capillary: 285 mg/dL — ABNORMAL HIGH (ref 65–99)
Glucose-Capillary: 353 mg/dL — ABNORMAL HIGH (ref 65–99)

## 2017-03-24 LAB — BASIC METABOLIC PANEL
ANION GAP: 8 (ref 5–15)
BUN: 22 mg/dL — ABNORMAL HIGH (ref 6–20)
CHLORIDE: 107 mmol/L (ref 101–111)
CO2: 25 mmol/L (ref 22–32)
Calcium: 9.7 mg/dL (ref 8.9–10.3)
Creatinine, Ser: 1.16 mg/dL — ABNORMAL HIGH (ref 0.44–1.00)
GFR calc non Af Amer: 54 mL/min — ABNORMAL LOW (ref >60)
Glucose, Bld: 69 mg/dL (ref 65–99)
POTASSIUM: 3.9 mmol/L (ref 3.5–5.1)
SODIUM: 140 mmol/L (ref 135–145)

## 2017-03-24 LAB — CBC WITH DIFFERENTIAL/PLATELET
BASOS PCT: 0 %
Basophils Absolute: 0 10*3/uL (ref 0.0–0.1)
EOS ABS: 0.1 10*3/uL (ref 0.0–0.7)
Eosinophils Relative: 2 %
HEMATOCRIT: 33.3 % — AB (ref 36.0–46.0)
HEMOGLOBIN: 10.7 g/dL — AB (ref 12.0–15.0)
Lymphocytes Relative: 16 %
Lymphs Abs: 0.9 10*3/uL (ref 0.7–4.0)
MCH: 28.7 pg (ref 26.0–34.0)
MCHC: 32.1 g/dL (ref 30.0–36.0)
MCV: 89.3 fL (ref 78.0–100.0)
Monocytes Absolute: 0.5 10*3/uL (ref 0.1–1.0)
Monocytes Relative: 9 %
NEUTROS ABS: 3.9 10*3/uL (ref 1.7–7.7)
NEUTROS PCT: 73 %
Platelets: 215 10*3/uL (ref 150–400)
RBC: 3.73 MIL/uL — AB (ref 3.87–5.11)
RDW: 14.5 % (ref 11.5–15.5)
WBC: 5.4 10*3/uL (ref 4.0–10.5)

## 2017-03-24 LAB — MRSA PCR SCREENING: MRSA by PCR: POSITIVE — AB

## 2017-03-24 MED ORDER — MUPIROCIN 2 % EX OINT
1.0000 | TOPICAL_OINTMENT | Freq: Two times a day (BID) | CUTANEOUS | Status: AC
Start: 2017-03-24 — End: 2017-03-29
  Administered 2017-03-24 – 2017-03-28 (×9): 1 via NASAL
  Filled 2017-03-24 (×2): qty 22

## 2017-03-24 MED ORDER — DOXYCYCLINE HYCLATE 100 MG IV SOLR
100.0000 mg | Freq: Two times a day (BID) | INTRAVENOUS | Status: DC
  Administered 2017-03-24 – 2017-03-26 (×4): 100 mg via INTRAVENOUS
  Filled 2017-03-24 (×5): qty 100

## 2017-03-24 MED ORDER — INSULIN NPH (HUMAN) (ISOPHANE) 100 UNIT/ML ~~LOC~~ SUSP
12.0000 [IU] | Freq: Two times a day (BID) | SUBCUTANEOUS | Status: DC
  Administered 2017-03-24: 12 [IU] via SUBCUTANEOUS
  Filled 2017-03-24: qty 10

## 2017-03-24 MED ORDER — CHLORHEXIDINE GLUCONATE CLOTH 2 % EX PADS
6.0000 | MEDICATED_PAD | Freq: Every day | CUTANEOUS | Status: AC
Start: 2017-03-24 — End: 2017-03-29
  Administered 2017-03-24: 6 via TOPICAL

## 2017-03-24 MED ORDER — INSULIN ASPART 100 UNIT/ML ~~LOC~~ SOLN
0.0000 [IU] | Freq: Three times a day (TID) | SUBCUTANEOUS | Status: DC
  Administered 2017-03-24: 5 [IU] via SUBCUTANEOUS
  Administered 2017-03-24: 9 [IU] via SUBCUTANEOUS
  Administered 2017-03-25 – 2017-03-26 (×3): 3 [IU] via SUBCUTANEOUS
  Administered 2017-03-27 – 2017-03-29 (×3): 2 [IU] via SUBCUTANEOUS
  Administered 2017-03-29: 3 [IU] via SUBCUTANEOUS

## 2017-03-24 NOTE — Progress Notes (Signed)
*  Preliminary Results* Bilateral lower extremity venous duplex completed. Bilateral lower extremities are negative for deep vein thrombosis. There is no evidence of Baker's cyst bilaterally.   03/24/2017 2:43 PM Gertie FeyMichelle Dandrea Medders, BS, RVT, RDCS, RDMS

## 2017-03-24 NOTE — Progress Notes (Signed)
PROGRESS NOTE    [name] Parodyamela S Garguilo  ZOX:[id number] DOB: Feb 25, 1966 DOA: 03/23/2017 PCP: Dois Davenportichter, Karen L, MD    Brief Narrative: [name] is a 51 y.o. female with medical history significant of osteoarthritis, chronic kidney disease, headaches, depression, hypertension, hypothyroidism, kidney transplant, immunosuppression due to transplant medications, history of pneumonia, insulin requiring type 2 diabetes who is coming to the emergency department via EMS after becoming hypoglycemic with a blood glucose in the 30s at home. She also complains of recurrent bilateral lower extremities infection. [facility] for mild hypoglycemia, was also diagnosed with cellulitis and discharged home on oral antibiotics. She denies fever, chills, earaches, sore throat or rhinorrhea. However she complains of dizziness, frontal headaches and nasal congestion. She denies chest pain, palpitations, dyspnea, diaphoresis, pitting edema of the lower extremities PND or orthopnea. Denies abdominal pain, nausea, emesis, diarrhea, constipation, melena or hematochezia. She denies dysuria or hematuria.   ED Course: Her glucose improved with oral intake. Her initial vital signs temperature 98.41F, pulse 76, blood pressure 141/79 mmHg, respirations 16 and O2 sat 97% on room air. WBC was 5, hemoglobin 10.2 g/dL and platelets [id number]. Her BNP was 261 pg/mL. Sodium 141, potassium 3.8, chloride 110, bicarbonate 23 mmol/L. BUN was 20, creatinine 1.35 and glucose 118 mg/dL. Her LFTs shows hypomagnesemia 2.7 g/dL and low transaminases level. Chest radiograph shows bronchitic changes. She was given a dose of cefazolin in the emergency department.  Assessment & Plan:   Principal Problem:   Lower extremity cellulitis Active Problems:   Hypothyroidism   H/O kidney transplant   Essential hypertension   GERD (gastroesophageal reflux disease)   Hypomagnesemia   Hypoglycemia   Anemia   Type II  diabetes mellitus (HCC)  1-Right Lower extremity cellulitis on chronic venous stasis.  Will add doxy to cover for MRSA.  Continue with ceftriaxone.  Doppler negative.  Follow blood culture.   2-type 2 DM, hypoglycemia. Hold 70/30. SSI.  Prior history of DKA, will start long acting   3- Hypomagnesemia; received 2 gr.  Repeat labs in am.   4-Hypothyroidism; continue with synthroid.   Essential hypertension Continue amlodipine 10 mg by mouth daily. Continue carvedilol 12.5 mg by mouth twice a day. Monitor blood pressure.  Anemia Hb stable at 10.,     H/O kidney transplant Continue mycophenolate 500 mg by mouth twice a day. Continue prednisone 5 mg by mouth at bedtime. Continue tacrolimus 0.5-1 mg by mouth twice a day. Cr stable.     GERD (gastroesophageal reflux disease). Pantoprazole 40 mg by mouth daily        DVT prophylaxis: lovenox Code Status: full code.  Family Communication: care discussed with patient.  Disposition Plan: home in 24 to 48 hours.   Consultants:   none   Procedures: doppler; negative for DVT    Antimicrobials:   Ceftriaxone  Doxy    Subjective: She is complaining of LE pain.  Still with redness and edema. She report oozing from LE.   Objective: Vitals:   03/23/17 2151 03/23/17 2319 03/24/17 0607 03/24/17 1332  BP: (!) 145/76 (!) 158/79 (!) 146/69 (!) 149/58  Pulse: 71 72 72 74  Resp: 11 15 15 16   Temp:  97.7 F (36.5 C) (!) 97.5 F (36.4 C) 97.9 F (36.6 C)  TempSrc:  Oral Oral Oral  SpO2: 90% 96% 90% 99%    Intake/Output Summary (Last 24 hours) at 03/24/17 1452 Last data filed at 03/24/17 1331  Gross per  24 hour  Intake              530 ml  Output                0 ml  Net              530 ml   There were no vitals filed for this visit.  Examination:  General exam: Appears calm and comfortable  Respiratory system: Clear to auscultation. Respiratory effort normal. Cardiovascular system: S1 & S2 heard,  RRR. No JVD, murmurs, rubs, gallops or clicks. No pedal edema. Gastrointestinal system: Abdomen is nondistended, soft and nontender. No organomegaly or masses felt. Normal bowel sounds heard. Central nervous system: Alert and oriented. No focal neurological deficits. Extremities: Symmetric 5 x 5 power. Multiples toe amputation  Skin: right LE with edema, hyperpigmentation and redness.  Psychiatry: Judgement and insight appear normal. Mood & affect appropriate.     Data Reviewed: I have personally reviewed following labs and imaging studies  CBC:  Recent Labs Lab 03/22/17 1703 03/23/17 1908 03/24/17 0804  WBC 4.4 5.0 5.4  NEUTROABS 2.5 3.3 3.9  HGB 9.7* 10.2* 10.7*  HCT 31.0* 32.3* 33.3*  MCV 89.1 89.7 89.3  PLT 216 214 215   Basic Metabolic Panel:  Recent Labs Lab 03/22/17 1703 03/23/17 1908 03/24/17 0804  NA 138 141 140  K 3.8 3.8 3.9  CL 109 110 107  CO2 23 23 25   GLUCOSE 94 118* 69  BUN 20 23* 22*  CREATININE 1.47* 1.35* 1.16*  CALCIUM 9.3 9.6 9.7  MG  --  1.6*  --    GFR: CrCl cannot be calculated (Unknown ideal weight.). Liver Function Tests:  Recent Labs Lab 03/22/17 1703 03/23/17 1908  AST 13* 14*  ALT 8* 11*  ALKPHOS 99 110  BILITOT 0.3 0.3  PROT 5.8* 6.5  ALBUMIN 2.4* 2.7*   No results for input(s): LIPASE, AMYLASE in the last 168 hours. No results for input(s): AMMONIA in the last 168 hours. Coagulation Profile: No results for input(s): INR, PROTIME in the last 168 hours. Cardiac Enzymes: No results for input(s): CKTOTAL, CKMB, CKMBINDEX, TROPONINI in the last 168 hours. BNP (last 3 results) No results for input(s): PROBNP in the last 8760 hours. HbA1C: No results for input(s): HGBA1C in the last 72 hours. CBG:  Recent Labs Lab 03/23/17 2204 03/23/17 2317 03/24/17 0736 03/24/17 0915 03/24/17 1143  GLUCAP 115* 85 65 152* 285*   Lipid Profile: No results for input(s): CHOL, HDL, LDLCALC, TRIG, CHOLHDL, LDLDIRECT in the last 72  hours. Thyroid Function Tests: No results for input(s): TSH, T4TOTAL, FREET4, T3FREE, THYROIDAB in the last 72 hours. Anemia Panel: No results for input(s): VITAMINB12, FOLATE, FERRITIN, TIBC, IRON, RETICCTPCT in the last 72 hours. Sepsis Labs:  Recent Labs Lab 03/23/17 1917 03/23/17 2222  LATICACIDVEN 1.91* 1.23    Recent Results (from the past 240 hour(s))  MRSA PCR Screening     Status: Abnormal   Collection Time: 03/23/17 11:37 PM  Result Value Ref Range Status   MRSA by PCR POSITIVE (A) NEGATIVE Final    Comment:        The GeneXpert MRSA Assay (FDA approved for NASAL specimens only), is one component of a comprehensive MRSA colonization surveillance program. It is not intended to diagnose MRSA infection nor to guide or monitor treatment for MRSA infections. RESULT CALLED TO, READ BACK BY AND VERIFIED WITH: RYAN,T RN 7.18.18 @0126  ZANDO,C  Radiology Studies: Dg Chest 2 View  Result Date: 03/23/2017 CLINICAL DATA:  Shortness of breath EXAM: CHEST  2 VIEW COMPARISON:  03/01/2017 FINDINGS: Interstitial coarsening that is favored bronchitic. No Kerley lines or air bronchogram. No effusion or pneumothorax. Normal heart size and aortic contours. IMPRESSION: Bronchitic type markings without collapse or consolidation. Electronically Signed   By: Marnee Spring M.D.   On: 03/23/2017 19:36        Scheduled Meds: . amLODipine  10 mg Oral Daily  . aspirin EC  81 mg Oral Daily  . carvedilol  50 mg Oral BID  . Chlorhexidine Gluconate Cloth  6 each Topical Q0600  . enoxaparin (LOVENOX) injection  40 mg Subcutaneous Q24H  . famotidine  20 mg Oral QHS  . insulin aspart  0-9 Units Subcutaneous TID WC  . levothyroxine  137 mcg Oral QAC breakfast  . mupirocin ointment  1 application Nasal BID  . mycophenolate  500 mg Oral BID  . predniSONE  5 mg Oral QHS  . tacrolimus  0.5 mg Oral Daily  . tacrolimus  1 mg Oral QHS   Continuous Infusions: . cefTRIAXone  (ROCEPHIN)  IV 1 g (03/24/17 0035)  . doxycycline (VIBRAMYCIN) IV 100 mg (03/24/17 1226)     LOS: 1 day    Time spent: 35 minutes    Alba Cory, MD [facility] Pager [phone]  If 7PM-7AM, please contact night-coverage [email or web address] Password [email or web address] 03/24/2017, 2:52 PM

## 2017-03-24 NOTE — Progress Notes (Signed)
Inpatient Diabetes Program Recommendations  AACE/ADA: New Consensus Statement on Inpatient Glycemic Control (2015)  Target Ranges:  Prepandial:   less than 140 mg/dL      Peak postprandial:   less than 180 mg/dL (1-2 hours)      Critically ill patients:  140 - 180 mg/dL   Lab Results  Component Value Date   GLUCAP 285 (H) 03/24/2017   HGBA1C 10.4 (H) 01/27/2017    Review of Glycemic Control  Diabetes history: DM1 Outpatient Diabetes medications: Novolin 70/30 40 units bid Current orders for Inpatient glycemic control: Novolog 0-9 units tidwc  Needs basal insulin.  Inpatient Diabetes Program Recommendations:     Start 70/30 Insulin- 20 units BID with meals tonight with supper (20 units BID would be 50% total home dose)  Will follow.   Thank you. Ailene Ardshonda Lux Skilton, RD, LDN, CDE Inpatient Diabetes Coordinator [phone]

## 2017-03-24 NOTE — Care Management Note (Signed)
Case Management Note  Patient Details  Name: [name] MRN: [id number] Date of Birth: 11/06/1965  Subjective/Objective:          Hypoglycemia/elevated bpn and lactic acid.  failed outpt treatment for bilateral lower ext. Infection.          Action/Plan:  Date:  March 24, 2017 Chart reviewed for concurrent status and case management needs. Will continue to follow patient progress. Discharge Planning: following for needs Expected discharge date: [id number] Marcelle SmilingRhonda Davis, BSN, [place], [place]   [phone]  Expected Discharge Date:   (unknown)               Expected Discharge Plan:  Home/Self Care  In-House Referral:     Discharge planning Services  CM Consult  Post Acute Care Choice:    Choice offered to:     DME Arranged:    DME Agency:     HH Arranged:    HH Agency:     Status of Service:  In process, will continue to follow  If discussed at Long Length of Stay Meetings, dates discussed:    Additional Comments:  Golda AcreDavis, Rhonda Lynn, RN 03/24/2017, 8:42 AM

## 2017-03-25 DIAGNOSIS — L03119 Cellulitis of unspecified part of limb: Secondary | ICD-10-CM

## 2017-03-25 DIAGNOSIS — I10 Essential (primary) hypertension: Secondary | ICD-10-CM

## 2017-03-25 LAB — BASIC METABOLIC PANEL
ANION GAP: 6 (ref 5–15)
BUN: 25 mg/dL — ABNORMAL HIGH (ref 6–20)
CALCIUM: 9.2 mg/dL (ref 8.9–10.3)
CO2: 25 mmol/L (ref 22–32)
CREATININE: 1.47 mg/dL — AB (ref 0.44–1.00)
Chloride: 104 mmol/L (ref 101–111)
GFR, EST AFRICAN AMERICAN: 47 mL/min — AB (ref >60)
GFR, EST NON AFRICAN AMERICAN: 41 mL/min — AB (ref >60)
Glucose, Bld: 194 mg/dL — ABNORMAL HIGH (ref 65–99)
Potassium: 4.7 mmol/L (ref 3.5–5.1)
Sodium: 135 mmol/L (ref 135–145)

## 2017-03-25 LAB — CBC WITH DIFFERENTIAL/PLATELET
BASOS ABS: 0 10*3/uL (ref 0.0–0.1)
BASOS PCT: 1 %
EOS ABS: 0.1 10*3/uL (ref 0.0–0.7)
EOS PCT: 2 %
HEMATOCRIT: 30.9 % — AB (ref 36.0–46.0)
Hemoglobin: 10.2 g/dL — ABNORMAL LOW (ref 12.0–15.0)
Lymphocytes Relative: 25 %
Lymphs Abs: 1.3 10*3/uL (ref 0.7–4.0)
MCH: 29.3 pg (ref 26.0–34.0)
MCHC: 33 g/dL (ref 30.0–36.0)
MCV: 88.8 fL (ref 78.0–100.0)
MONO ABS: 0.4 10*3/uL (ref 0.1–1.0)
MONOS PCT: 8 %
Neutro Abs: 3.2 10*3/uL (ref 1.7–7.7)
Neutrophils Relative %: 64 %
PLATELETS: 209 10*3/uL (ref 150–400)
RBC: 3.48 MIL/uL — ABNORMAL LOW (ref 3.87–5.11)
RDW: 14.4 % (ref 11.5–15.5)
WBC: 5.1 10*3/uL (ref 4.0–10.5)

## 2017-03-25 LAB — GLUCOSE, CAPILLARY
GLUCOSE-CAPILLARY: 214 mg/dL — AB (ref 65–99)
Glucose-Capillary: 235 mg/dL — ABNORMAL HIGH (ref 65–99)
Glucose-Capillary: 119 mg/dL — ABNORMAL HIGH (ref 65–99)
Glucose-Capillary: 140 mg/dL — ABNORMAL HIGH (ref 65–99)

## 2017-03-25 LAB — MAGNESIUM: Magnesium: 1.9 mg/dL (ref 1.7–2.4)

## 2017-03-25 MED ORDER — SODIUM CHLORIDE 0.9 % IV SOLN
INTRAVENOUS | Status: DC
  Administered 2017-03-25 – 2017-03-29 (×4): via INTRAVENOUS

## 2017-03-25 MED ORDER — INSULIN NPH (HUMAN) (ISOPHANE) 100 UNIT/ML ~~LOC~~ SUSP
20.0000 [IU] | Freq: Two times a day (BID) | SUBCUTANEOUS | Status: DC
  Administered 2017-03-25 – 2017-03-27 (×6): 20 [IU] via SUBCUTANEOUS

## 2017-03-25 MED ORDER — OXYCODONE HCL 5 MG PO TABS
5.0000 mg | ORAL_TABLET | ORAL | Status: DC | PRN
  Administered 2017-03-25 – 2017-03-26 (×3): 5 mg via ORAL
  Filled 2017-03-25 (×3): qty 1

## 2017-03-25 MED ORDER — FAMOTIDINE IN NACL 20-0.9 MG/50ML-% IV SOLN
20.0000 mg | INTRAVENOUS | Status: DC
  Administered 2017-03-25: 20 mg via INTRAVENOUS
  Filled 2017-03-25: qty 50

## 2017-03-25 NOTE — Progress Notes (Signed)
PROGRESS NOTE    [name]  ZOX:[id number] DOB: 1966/07/18 DOA: 03/23/2017 PCP: Dois Davenportichter, Karen L, MD    Brief Narrative: [name] Parodyamela S Mckoy is a 51 y.o. female with medical history significant of osteoarthritis, chronic kidney disease, headaches, depression, hypertension, hypothyroidism, kidney transplant, immunosuppression due to transplant medications, history of pneumonia, insulin requiring type 2 diabetes who is coming to the emergency department via EMS after becoming hypoglycemic with a blood glucose in the 30s at home. She also complains of recurrent bilateral lower extremities infection. She was seen yesterday at [facility] for mild hypoglycemia, was also diagnosed with cellulitis and discharged home on oral antibiotics. She denies fever, chills, earaches, sore throat or rhinorrhea. However she complains of dizziness, frontal headaches and nasal congestion. She denies chest pain, palpitations, dyspnea, diaphoresis, pitting edema of the lower extremities PND or orthopnea. Denies abdominal pain, nausea, emesis, diarrhea, constipation, melena or hematochezia. She denies dysuria or hematuria.   ED Course: Her glucose improved with oral intake. Her initial vital signs temperature 98.71F, pulse 76, blood pressure 141/79 mmHg, respirations 16 and O2 sat 97% on room air. WBC was 5, hemoglobin 10.2 g/dL and platelets [id number]. Her BNP was 261 pg/mL. Sodium 141, potassium 3.8, chloride 110, bicarbonate 23 mmol/L. BUN was 20, creatinine 1.35 and glucose 118 mg/dL. Her LFTs shows hypomagnesemia 2.7 g/dL and low transaminases level. Chest radiograph shows bronchitic changes. She was given a dose of cefazolin in the emergency department.  Assessment & Plan:   Principal Problem:   Lower extremity cellulitis Active Problems:   Hypothyroidism   H/O kidney transplant   Essential hypertension   GERD (gastroesophageal reflux disease)   Hypomagnesemia   Hypoglycemia   Anemia   Type II  diabetes mellitus (HCC)  1-Right Lower extremity cellulitis on chronic venous stasis.  Continue with doxy to cover for MRSA.  Continue with ceftriaxone.  Doppler negative.  Follow blood culture. No growth to date   2-type 2 DM, hypoglycemia. Hold 70/30. SSI.  Started 70/30 20 units BID>   3- Hypomagnesemia; received 2 gr.  Mag at 1.9  4-Hypothyroidism; continue with synthroid.   Essential hypertension Continue amlodipine 10 mg by mouth daily. Continue carvedilol 12.5 mg by mouth twice a day. Monitor blood pressure.  Anemia Hb stable at 10.,     H/O kidney transplant Continue mycophenolate 500 mg by mouth twice a day. Continue prednisone 5 mg by mouth at bedtime. Continue tacrolimus 0.5-1 mg by mouth twice a day. Mildly increase cr, started IV fluids.     GERD (gastroesophageal reflux disease). Pantoprazole 40 mg by mouth daily        DVT prophylaxis: lovenox Code Status: full code.  Family Communication: care discussed with patient.  Disposition Plan: home in 24 to 48 hours.   Consultants:   none   Procedures: doppler; negative for DVT    Antimicrobials:   Ceftriaxone  Doxy    Subjective: Vomited this am.  Not feeling well.  Right leg still with edema and pain   Objective: Vitals:   03/24/17 0607 03/24/17 1332 03/24/17 2126 03/25/17 0453  BP: (!) 146/69 (!) 149/58 135/70 136/74  Pulse: 72 74 71 71  Resp: 15 16 15 17   Temp: (!) 97.5 F (36.4 C) 97.9 F (36.6 C) 98.3 F (36.8 C) 98 F (36.7 C)  TempSrc: Oral Oral Oral Oral  SpO2: 90% 99% 97% 95%  Height:    5\' 9"  (1.753 m)    Intake/Output Summary (Last 24 hours) at  03/25/17 1056 Last data filed at 03/25/17 0514  Gross per 24 hour  Intake              790 ml  Output                0 ml  Net              790 ml   There were no vitals filed for this visit.  Examination:  General exam: NAD Respiratory system: CTA, normal respiratory effort.  Cardiovascular system: S 1, S 2  RRR Gastrointestinal system: BS present, soft, nt Central nervous system: non focal.  Extremities: symmetric power. . Multiples toe amputation  Skin: right LE with edema, hyperpigmentation and less redness Psychiatry: Judgement and insight appear normal. Mood & affect appropriate.     Data Reviewed: I have personally reviewed following labs and imaging studies  CBC:  Recent Labs Lab 03/22/17 1703 03/23/17 1908 03/24/17 0804 03/25/17 0544  WBC 4.4 5.0 5.4 5.1  NEUTROABS 2.5 3.3 3.9 3.2  HGB 9.7* 10.2* 10.7* 10.2*  HCT 31.0* 32.3* 33.3* 30.9*  MCV 89.1 89.7 89.3 88.8  PLT 216 214 215 209   Basic Metabolic Panel:  Recent Labs Lab 03/22/17 1703 03/23/17 1908 03/24/17 0804 03/25/17 0544  NA 138 141 140 135  K 3.8 3.8 3.9 4.7  CL 109 110 107 104  CO2 23 23 25 25   GLUCOSE 94 118* 69 194*  BUN 20 23* 22* 25*  CREATININE 1.47* 1.35* 1.16* 1.47*  CALCIUM 9.3 9.6 9.7 9.2  MG  --  1.6*  --  1.9   GFR: CrCl cannot be calculated (Unknown ideal weight.). Liver Function Tests:  Recent Labs Lab 03/22/17 1703 03/23/17 1908  AST 13* 14*  ALT 8* 11*  ALKPHOS 99 110  BILITOT 0.3 0.3  PROT 5.8* 6.5  ALBUMIN 2.4* 2.7*   No results for input(s): LIPASE, AMYLASE in the last 168 hours. No results for input(s): AMMONIA in the last 168 hours. Coagulation Profile: No results for input(s): INR, PROTIME in the last 168 hours. Cardiac Enzymes: No results for input(s): CKTOTAL, CKMB, CKMBINDEX, TROPONINI in the last 168 hours. BNP (last 3 results) No results for input(s): PROBNP in the last 8760 hours. HbA1C: No results for input(s): HGBA1C in the last 72 hours. CBG:  Recent Labs Lab 03/24/17 0915 03/24/17 1143 03/24/17 1656 03/24/17 2129 03/25/17 0742  GLUCAP 152* 285* 353* 135* 214*   Lipid Profile: No results for input(s): CHOL, HDL, LDLCALC, TRIG, CHOLHDL, LDLDIRECT in the last 72 hours. Thyroid Function Tests: No results for input(s): TSH, T4TOTAL, FREET4,  T3FREE, THYROIDAB in the last 72 hours. Anemia Panel: No results for input(s): VITAMINB12, FOLATE, FERRITIN, TIBC, IRON, RETICCTPCT in the last 72 hours. Sepsis Labs:  Recent Labs Lab 03/23/17 1917 03/23/17 2222  LATICACIDVEN 1.91* 1.23    Recent Results (from the past 240 hour(s))  Blood culture (routine x 2)     Status: None (Preliminary result)   Collection Time: 03/23/17 10:05 PM  Result Value Ref Range Status   Specimen Description BLOOD BLOOD RIGHT FOREARM  Final   Special Requests   Final    BOTTLES DRAWN AEROBIC AND ANAEROBIC Blood Culture adequate volume   Culture   Final    NO GROWTH 1 DAY Performed at [facility], [address], [place], [place] [address]    Report Status PENDING  Incomplete  MRSA PCR Screening     Status: Abnormal   Collection  Time: 03/23/17 11:37 PM  Result Value Ref Range Status   MRSA by PCR POSITIVE (A) NEGATIVE Final    Comment:        The GeneXpert MRSA Assay (FDA approved for NASAL specimens only), is one component of a comprehensive MRSA colonization surveillance program. It is not intended to diagnose MRSA infection nor to guide or monitor treatment for MRSA infections. RESULT CALLED TO, READ BACK BY AND VERIFIED WITH: RYAN,T RN 7.18.18 @0126  ZANDO,C   Blood culture (routine x 2)     Status: None (Preliminary result)   Collection Time: 03/23/17 11:48 PM  Result Value Ref Range Status   Specimen Description BLOOD BLOOD RIGHT HAND  Final   Special Requests   Final    BOTTLES DRAWN AEROBIC AND ANAEROBIC Blood Culture adequate volume   Culture   Final    NO GROWTH 1 DAY Performed at [facility], [address] [place], [place] [address]    Report Status PENDING  Incomplete         Radiology Studies: Dg Chest 2 View  Result Date: 03/23/2017 CLINICAL DATA:  Shortness of breath EXAM: CHEST  2 VIEW COMPARISON:  03/01/2017 FINDINGS: Interstitial coarsening that is favored bronchitic. No Kerley lines or air  bronchogram. No effusion or pneumothorax. Normal heart size and aortic contours. IMPRESSION: Bronchitic type markings without collapse or consolidation. Electronically Signed   By: Marnee Spring M.D.   On: 03/23/2017 19:36        Scheduled Meds: . amLODipine  10 mg Oral Daily  . aspirin EC  81 mg Oral Daily  . carvedilol  50 mg Oral BID  . Chlorhexidine Gluconate Cloth  6 each Topical Q0600  . enoxaparin (LOVENOX) injection  40 mg Subcutaneous Q24H  . insulin aspart  0-9 Units Subcutaneous TID WC  . insulin NPH Human  20 Units Subcutaneous BID AC & HS  . levothyroxine  137 mcg Oral QAC breakfast  . mupirocin ointment  1 application Nasal BID  . mycophenolate  500 mg Oral BID  . predniSONE  5 mg Oral QHS  . tacrolimus  0.5 mg Oral Daily  . tacrolimus  1 mg Oral QHS   Continuous Infusions: . sodium chloride 75 mL/hr at 03/25/17 0758  . cefTRIAXone (ROCEPHIN)  IV Stopped (03/25/17 0058)  . doxycycline (VIBRAMYCIN) IV Stopped (03/25/17 0228)  . famotidine (PEPCID) IV       LOS: 2 days    Time spent: 35 minutes    Alba Cory, MD [facility] Pager [phone]  If 7PM-7AM, please contact night-coverage [email or web address] Password [email or web address] 03/25/2017, 10:56 AM

## 2017-03-25 NOTE — Care Management Note (Signed)
Case Management Note  Patient Details  Name: [name] MRN: [id number] Date of Birth: [birth year]  Subjective/Objective:       Hypoglycemia, aki,dizziness              Action/Plan: Date:  July 192018  Chart reviewed for concurrent status and case management needs.  Will continue to follow patient progress.  Discharge Planning: following for needs  Expected discharge date: [id number]  Marcelle SmilingRhonda Davis, BSN, [place]3, [place]M   [phone]   Expected Discharge Date:   (unknown)               Expected Discharge Plan:  Home/Self Care  In-House Referral:     Discharge planning Services  CM Consult  Post Acute Care Choice:    Choice offered to:     DME Arranged:    DME Agency:     HH Arranged:    HH Agency:     Status of Service:  In process, will continue to follow  If discussed at Long Length of Stay Meetings, dates discussed:    Additional Comments:  Golda AcreDavis, Rhonda Lynn, RN 03/25/2017, 9:14 AM

## 2017-03-26 ENCOUNTER — Inpatient Hospital Stay (HOSPITAL_COMMUNITY): Payer: Medicare Other

## 2017-03-26 LAB — BASIC METABOLIC PANEL
ANION GAP: 8 (ref 5–15)
BUN: 24 mg/dL — AB (ref 6–20)
CHLORIDE: 105 mmol/L (ref 101–111)
CO2: 23 mmol/L (ref 22–32)
Calcium: 9.2 mg/dL (ref 8.9–10.3)
Creatinine, Ser: 1.54 mg/dL — ABNORMAL HIGH (ref 0.44–1.00)
GFR calc Af Amer: 44 mL/min — ABNORMAL LOW (ref >60)
GFR, EST NON AFRICAN AMERICAN: 38 mL/min — AB (ref >60)
GLUCOSE: 170 mg/dL — AB (ref 65–99)
POTASSIUM: 4.9 mmol/L (ref 3.5–5.1)
Sodium: 136 mmol/L (ref 135–145)

## 2017-03-26 LAB — GLUCOSE, CAPILLARY
GLUCOSE-CAPILLARY: 119 mg/dL — AB (ref 65–99)
GLUCOSE-CAPILLARY: 140 mg/dL — AB (ref 65–99)
Glucose-Capillary: 117 mg/dL — ABNORMAL HIGH (ref 65–99)
Glucose-Capillary: 236 mg/dL — ABNORMAL HIGH (ref 65–99)

## 2017-03-26 MED ORDER — NYSTATIN 100000 UNIT/GM EX POWD
Freq: Four times a day (QID) | CUTANEOUS | Status: DC
  Administered 2017-03-26 – 2017-03-29 (×11): via TOPICAL
  Filled 2017-03-26: qty 15

## 2017-03-26 MED ORDER — DOXYCYCLINE HYCLATE 100 MG PO TABS
100.0000 mg | ORAL_TABLET | Freq: Two times a day (BID) | ORAL | Status: DC
  Administered 2017-03-26 – 2017-03-29 (×7): 100 mg via ORAL
  Filled 2017-03-26 (×7): qty 1

## 2017-03-26 MED ORDER — OXYCODONE HCL 5 MG PO TABS
5.0000 mg | ORAL_TABLET | ORAL | Status: DC | PRN
  Administered 2017-03-26 – 2017-03-27 (×2): 10 mg via ORAL
  Filled 2017-03-26 (×2): qty 2

## 2017-03-26 MED ORDER — FAMOTIDINE 20 MG PO TABS
20.0000 mg | ORAL_TABLET | Freq: Every day | ORAL | Status: DC
  Administered 2017-03-26 – 2017-03-28 (×3): 20 mg via ORAL
  Filled 2017-03-26 (×3): qty 1

## 2017-03-26 NOTE — Progress Notes (Addendum)
PROGRESS NOTE    [name]  ZOX:[id number] DOB: 1966/06/09 DOA: 03/23/2017 PCP: Dois Davenportichter, Karen L, MD    Brief Narrative: [name] is a 51 y.o. female with medical history significant of osteoarthritis, chronic kidney disease, headaches, depression, hypertension, hypothyroidism, kidney transplant, immunosuppression due to transplant medications, history of pneumonia, insulin requiring type 2 diabetes who is coming to the emergency department via EMS after becoming hypoglycemic with a blood glucose in the 30s at home. She also complains of recurrent bilateral lower extremities infection. [facility] was [facility] for mild hypoglycemia, was also diagnosed with cellulitis and discharged home on oral antibiotics. She denies fever, chills, earaches, sore throat or rhinorrhea. However she complains of dizziness, frontal headaches and nasal congestion. She denies chest pain, palpitations, dyspnea, diaphoresis, pitting edema of the lower extremities PND or orthopnea. Denies abdominal pain, nausea, emesis, diarrhea, constipation, melena or hematochezia. She denies dysuria or hematuria.   ED Course: Her glucose improved with oral intake. Her initial vital signs temperature 98.6F, pulse 76, blood pressure 141/79 mmHg, respirations 16 and O2 sat 97% on room air. WBC was 5, hemoglobin 10.2 g/dL and platelets [id number]. Her BNP was 261 pg/mL. Sodium 141, potassium 3.8, chloride 110, bicarbonate 23 mmol/L. BUN was 20, creatinine 1.35 and glucose 118 mg/dL. Her LFTs shows hypomagnesemia 2.7 g/dL and low transaminases level. Chest radiograph shows bronchitic changes. She was given a dose of cefazolin in the emergency department.  Assessment & Plan:   Principal Problem:   Lower extremity cellulitis Active Problems:   Hypothyroidism   H/O kidney transplant   Essential hypertension   GERD (gastroesophageal reflux disease)   Hypomagnesemia   Hypoglycemia   Anemia   Type II  diabetes mellitus (HCC)  1-Right Lower extremity cellulitis on chronic venous stasis.  Continue with doxy to cover for MRSA.  Continue with ceftriaxone.  Doppler negative.  Follow blood culture. No growth to date  Improving.   2-type 2 DM, hypoglycemia. Hold 70/30. SSI.  Continue with  70/30 20 units BID>   3- Hypomagnesemia; received 2 gr.  Mag at 1.9  4-AKI  H/O kidney transplant Continue mycophenolate 500 mg by mouth twice a day. Continue prednisone 5 mg by mouth at bedtime. Continue tacrolimus 0.5-1 mg by mouth twice a day. Cr up to 1.5. Will continue with IV fluids.   Hypothyroidism; continue with synthroid.   Essential hypertension Continue amlodipine 10 mg by mouth daily. Continue carvedilol 12.5 mg by mouth twice a day. Monitor blood pressure.  Anemia Hb stable at 10.,      GERD (gastroesophageal reflux disease). Pantoprazole 40 mg by mouth daily        DVT prophylaxis: lovenox Code Status: full code.  Family Communication: care discussed with patient.  Disposition Plan: home in 24 to 48 hours.   Consultants:   none   Procedures: doppler; negative for DVT    Antimicrobials:   Ceftriaxone  Doxy    Subjective: Report some hip pain.  Leg pain is better   Objective: Vitals:   03/25/17 0453 03/25/17 1530 03/25/17 2117 03/26/17 0626  BP: 136/74 (!) 161/85 133/74   Pulse: 71 73 76 76  Resp: 17 17 16 16   Temp: 98 F (36.7 C) 98.4 F (36.9 C) 98.1 F (36.7 C) 98.6 F (37 C)  TempSrc: Oral Oral Oral Oral  SpO2: 95% 96% 95% 93%  Height: 5\' 9"  (1.753 m)       Intake/Output Summary (Last 24 hours) at  03/26/17 1419 Last data filed at 03/26/17 0830  Gross per 24 hour  Intake           2282.5 ml  Output                0 ml  Net           2282.5 ml   There were no vitals filed for this visit.  Examination:  General exam: NAD Respiratory system: CTA, normal respiratory effort.  Cardiovascular system: S 1, S 2  RRR Gastrointestinal system: BS present, soft, nt Central nervous system: non focal  Extremities: symmetric power. . Multiples toe amputation  Skin: right leg with less edema, chronic hyperpigmentation, redness improved.    Data Reviewed: I have personally reviewed following labs and imaging studies  CBC:  Recent Labs Lab 03/22/17 1703 03/23/17 1908 03/24/17 0804 03/25/17 0544  WBC 4.4 5.0 5.4 5.1  NEUTROABS 2.5 3.3 3.9 3.2  HGB 9.7* 10.2* 10.7* 10.2*  HCT 31.0* 32.3* 33.3* 30.9*  MCV 89.1 89.7 89.3 88.8  PLT 216 214 215 209   Basic Metabolic Panel:  Recent Labs Lab 03/22/17 1703 03/23/17 1908 03/24/17 0804 03/25/17 0544 03/26/17 0920  NA 138 141 140 135 136  K 3.8 3.8 3.9 4.7 4.9  CL 109 110 107 104 105  CO2 23 23 25 25 23   GLUCOSE 94 118* 69 194* 170*  BUN 20 23* 22* 25* 24*  CREATININE 1.47* 1.35* 1.16* 1.47* 1.54*  CALCIUM 9.3 9.6 9.7 9.2 9.2  MG  --  1.6*  --  1.9  --    GFR: CrCl cannot be calculated (Unknown ideal weight.). Liver Function Tests:  Recent Labs Lab 03/22/17 1703 03/23/17 1908  AST 13* 14*  ALT 8* 11*  ALKPHOS 99 110  BILITOT 0.3 0.3  PROT 5.8* 6.5  ALBUMIN 2.4* 2.7*   No results for input(s): LIPASE, AMYLASE in the last 168 hours. No results for input(s): AMMONIA in the last 168 hours. Coagulation Profile: No results for input(s): INR, PROTIME in the last 168 hours. Cardiac Enzymes: No results for input(s): CKTOTAL, CKMB, CKMBINDEX, TROPONINI in the last 168 hours. BNP (last 3 results) No results for input(s): PROBNP in the last 8760 hours. HbA1C: No results for input(s): HGBA1C in the last 72 hours. CBG:  Recent Labs Lab 03/25/17 1149 03/25/17 1731 03/25/17 2136 03/26/17 0735 03/26/17 1231  GLUCAP 235* 119* 140* 117* 236*   Lipid Profile: No results for input(s): CHOL, HDL, LDLCALC, TRIG, CHOLHDL, LDLDIRECT in the last 72 hours. Thyroid Function Tests: No results for input(s): TSH, T4TOTAL, FREET4, T3FREE,  THYROIDAB in the last 72 hours. Anemia Panel: No results for input(s): VITAMINB12, FOLATE, FERRITIN, TIBC, IRON, RETICCTPCT in the last 72 hours. Sepsis Labs:  Recent Labs Lab 03/23/17 1917 03/23/17 2222  LATICACIDVEN 1.91* 1.23    Recent Results (from the past 240 hour(s))  Blood culture (routine x 2)     Status: None (Preliminary result)   Collection Time: 03/23/17 10:05 PM  Result Value Ref Range Status   Specimen Description BLOOD BLOOD RIGHT FOREARM  Final   Special Requests   Final    BOTTLES DRAWN AEROBIC AND ANAEROBIC Blood Culture adequate volume   Culture   Final    NO GROWTH 2 DAYS Performed at [facility], [address], [place], [place] [address]    Report Status PENDING  Incomplete  MRSA PCR Screening     Status: Abnormal   Collection Time: 03/23/17 11:37  PM  Result Value Ref Range Status   MRSA by PCR POSITIVE (A) NEGATIVE Final    Comment:        The GeneXpert MRSA Assay (FDA approved for NASAL specimens only), is one component of a comprehensive MRSA colonization surveillance program. It is not intended to diagnose MRSA infection nor to guide or monitor treatment for MRSA infections. RESULT CALLED TO, READ BACK BY AND VERIFIED WITH: RYAN,T RN 7.18.18 @0126  ZANDO,C   Blood culture (routine x 2)     Status: None (Preliminary result)   Collection Time: 03/23/17 11:48 PM  Result Value Ref Range Status   Specimen Description BLOOD BLOOD RIGHT HAND  Final   Special Requests   Final    BOTTLES DRAWN AEROBIC AND ANAEROBIC Blood Culture adequate volume   Culture   Final    NO GROWTH 2 DAYS Performed at [facility], [address], [place], [place] [address]    Report Status PENDING  Incomplete         Radiology Studies: No results found.      Scheduled Meds: . amLODipine  10 mg Oral Daily  . aspirin EC  81 mg Oral Daily  . carvedilol  50 mg Oral BID  . Chlorhexidine Gluconate Cloth  6 each Topical Q0600  . doxycycline  100  mg Oral Q12H  . enoxaparin (LOVENOX) injection  40 mg Subcutaneous Q24H  . famotidine  20 mg Oral QHS  . insulin aspart  0-9 Units Subcutaneous TID WC  . insulin NPH Human  20 Units Subcutaneous BID AC & HS  . levothyroxine  137 mcg Oral QAC breakfast  . mupirocin ointment  1 application Nasal BID  . mycophenolate  500 mg Oral BID  . nystatin   Topical QID  . predniSONE  5 mg Oral QHS  . tacrolimus  0.5 mg Oral Daily  . tacrolimus  1 mg Oral QHS   Continuous Infusions: . sodium chloride 75 mL/hr at 03/25/17 1113  . cefTRIAXone (ROCEPHIN)  IV Stopped (03/26/17 0044)     LOS: 3 days    Time spent: 35 minutes    Alba Cory, MD [facility] Pager [phone]  If 7PM-7AM, please contact night-coverage [email or web address] Password [email or web address] 03/26/2017, 2:19 PM

## 2017-03-27 LAB — BASIC METABOLIC PANEL
ANION GAP: 6 (ref 5–15)
BUN: 25 mg/dL — ABNORMAL HIGH (ref 6–20)
CO2: 26 mmol/L (ref 22–32)
Calcium: 9.3 mg/dL (ref 8.9–10.3)
Chloride: 107 mmol/L (ref 101–111)
Creatinine, Ser: 1.6 mg/dL — ABNORMAL HIGH (ref 0.44–1.00)
GFR, EST AFRICAN AMERICAN: 42 mL/min — AB (ref >60)
GFR, EST NON AFRICAN AMERICAN: 37 mL/min — AB (ref >60)
Glucose, Bld: 78 mg/dL (ref 65–99)
POTASSIUM: 4.8 mmol/L (ref 3.5–5.1)
SODIUM: 139 mmol/L (ref 135–145)

## 2017-03-27 LAB — URINALYSIS, ROUTINE W REFLEX MICROSCOPIC
BILIRUBIN URINE: NEGATIVE
Bacteria, UA: NONE SEEN
GLUCOSE, UA: 50 mg/dL — AB
Hgb urine dipstick: NEGATIVE
KETONES UR: NEGATIVE mg/dL
LEUKOCYTES UA: NEGATIVE
NITRITE: NEGATIVE
Protein, ur: 100 mg/dL — AB
Specific Gravity, Urine: 1.013 (ref 1.005–1.030)
pH: 5 (ref 5.0–8.0)

## 2017-03-27 LAB — GLUCOSE, CAPILLARY
GLUCOSE-CAPILLARY: 115 mg/dL — AB (ref 65–99)
GLUCOSE-CAPILLARY: 79 mg/dL (ref 65–99)
Glucose-Capillary: 152 mg/dL — ABNORMAL HIGH (ref 65–99)
Glucose-Capillary: 125 mg/dL — ABNORMAL HIGH (ref 65–99)

## 2017-03-27 MED ORDER — OXYCODONE HCL 5 MG PO TABS: 5.0000 mg | ORAL_TABLET | Freq: Four times a day (QID) | ORAL | Status: DC | PRN

## 2017-03-27 NOTE — Progress Notes (Signed)
PROGRESS NOTE    [name] Parodyamela S Wichert  ZOX:[id number] DOB: 10/09/1965 DOA: 03/23/2017 PCP: Dois Davenportichter, Karen L, MD    Brief Narrative: [name] is a 51 y.o. female with medical history significant of osteoarthritis, chronic kidney disease, headaches, depression, hypertension, hypothyroidism, kidney transplant, immunosuppression due to transplant medications, history of pneumonia, insulin requiring type 2 diabetes who is coming to the emergency department via EMS after becoming hypoglycemic with a blood glucose in the 30s at home. She also complains of recurrent bilateral lower extremities infection. She was seen yesterday at [facility] emergency department for mild hypoglycemia, was also diagnosed with cellulitis and discharged home on oral antibiotics. She denies fever, chills, earaches, sore throat or rhinorrhea. However she complains of dizziness, frontal headaches and nasal congestion. She denies chest pain, palpitations, dyspnea, diaphoresis, pitting edema of the lower extremities PND or orthopnea. Denies abdominal pain, nausea, emesis, diarrhea, constipation, melena or hematochezia. She denies dysuria or hematuria.   ED Course: Her glucose improved with oral intake. Her initial vital signs temperature 98.27F, pulse 76, blood pressure 141/79 mmHg, respirations 16 and O2 sat 97% on room air. WBC was 5, hemoglobin 10.2 g/dL and platelets [id number]. Her BNP was 261 pg/mL. Sodium 141, potassium 3.8, chloride 110, bicarbonate 23 mmol/L. BUN was 20, creatinine 1.35 and glucose 118 mg/dL. Her LFTs shows hypomagnesemia 2.7 g/dL and low transaminases level. Chest radiograph shows bronchitic changes. She was given a dose of cefazolin in the emergency department.  Assessment & Plan:   Principal Problem:   Lower extremity cellulitis Active Problems:   Hypothyroidism   H/O kidney transplant   Essential hypertension   GERD (gastroesophageal reflux disease)   Hypomagnesemia   Hypoglycemia   Anemia   Type II  diabetes mellitus (HCC)  1-Right Lower extremity cellulitis on chronic venous stasis.  Continue with doxy to cover for MRSA.  Continue with ceftriaxone.  Doppler negative.  Follow blood culture. No growth to date  Improving.   Weak, tired, sleepy; will hold oxycodone. Check CBG  Food get stuck; check esophagogram  Hip pain;  Better. X ray negative  2-type 2 DM, hypoglycemia. Hold 70/30. SSI.  Continue with  70/30 20 units BID>   3- Hypomagnesemia; received 2 gr.  Mag at 1.9  4-AKI  H/O kidney transplant Continue mycophenolate 500 mg by mouth twice a day. Continue prednisone 5 mg by mouth at bedtime. Continue tacrolimus 0.5-1 mg by mouth twice a day. Worsening kidney function. Will consult renal.   Hypothyroidism; continue with synthroid.   Essential hypertension Continue amlodipine 10 mg by mouth daily. Continue carvedilol 12.5 mg by mouth twice a day. Monitor blood pressure.  Anemia Hb stable at 10.,      GERD (gastroesophageal reflux disease). Pantoprazole 40 mg by mouth daily    DVT prophylaxis: lovenox Code Status: full code.  Family Communication: care discussed with patient.  Disposition Plan: home in 24 to 48 hours.   Consultants:   none   Procedures: doppler; negative for DVT    Antimicrobials:   Ceftriaxone  Doxy    Subjective: Report some hip pain.  Leg pain is better   Objective: Vitals:   03/26/17 1439 03/26/17 2155 03/27/17 0554 03/27/17 1105  BP:  123/60 (!) 149/68 (!) 145/67  Pulse: 78 76 78 75  Resp: 16 16 16    Temp: 98.7 F (37.1 C) 98.2 F (36.8 C) 98.5 F (36.9 C)   TempSrc: Oral Oral Oral   SpO2: 90% 90% 95%   Height:  Intake/Output Summary (Last 24 hours) at 03/27/17 1339 Last data filed at 03/27/17 0854  Gross per 24 hour  Intake          2263.75 ml  Output                0 ml  Net          2263.75 ml   There were no vitals filed for this visit.  Examination:  General exam: NAD Respiratory  system: CTA Cardiovascular system: S 1, S 2 RRR Gastrointestinal system: BS present, soft, nt Central nervous system: non focal.  Extremities: symmetric power. . Multiples toe amputation  Skin: right leg with no edema , chronic hyperpigmentation, redness improved.    Data Reviewed: I have personally reviewed following labs and imaging studies  CBC:  Recent Labs Lab 03/22/17 1703 03/23/17 1908 03/24/17 0804 03/25/17 0544  WBC 4.4 5.0 5.4 5.1  NEUTROABS 2.5 3.3 3.9 3.2  HGB 9.7* 10.2* 10.7* 10.2*  HCT 31.0* 32.3* 33.3* 30.9*  MCV 89.1 89.7 89.3 88.8  PLT 216 214 215 209   Basic Metabolic Panel:  Recent Labs Lab 03/23/17 1908 03/24/17 0804 03/25/17 0544 03/26/17 0920 03/27/17 0539  NA 141 140 135 136 139  K 3.8 3.9 4.7 4.9 4.8  CL 110 107 104 105 107  CO2 23 25 25 23 26   GLUCOSE 118* 69 194* 170* 78  BUN 23* 22* 25* 24* 25*  CREATININE 1.35* 1.16* 1.47* 1.54* 1.60*  CALCIUM 9.6 9.7 9.2 9.2 9.3  MG 1.6*  --  1.9  --   --    GFR: CrCl cannot be calculated (Unknown ideal weight.). Liver Function Tests:  Recent Labs Lab 03/22/17 1703 03/23/17 1908  AST 13* 14*  ALT 8* 11*  ALKPHOS 99 110  BILITOT 0.3 0.3  PROT 5.8* 6.5  ALBUMIN 2.4* 2.7*   No results for input(s): LIPASE, AMYLASE in the last 168 hours. No results for input(s): AMMONIA in the last 168 hours. Coagulation Profile: No results for input(s): INR, PROTIME in the last 168 hours. Cardiac Enzymes: No results for input(s): CKTOTAL, CKMB, CKMBINDEX, TROPONINI in the last 168 hours. BNP (last 3 results) No results for input(s): PROBNP in the last 8760 hours. HbA1C: No results for input(s): HGBA1C in the last 72 hours. CBG:  Recent Labs Lab 03/26/17 1231 03/26/17 1644 03/26/17 2208 03/27/17 0740 03/27/17 1140  GLUCAP 236* 119* 140* 115* 152*   Lipid Profile: No results for input(s): CHOL, HDL, LDLCALC, TRIG, CHOLHDL, LDLDIRECT in the last 72 hours. Thyroid Function Tests: No results for  input(s): TSH, T4TOTAL, FREET4, T3FREE, THYROIDAB in the last 72 hours. Anemia Panel: No results for input(s): VITAMINB12, FOLATE, FERRITIN, TIBC, IRON, RETICCTPCT in the last 72 hours. Sepsis Labs:  Recent Labs Lab 03/23/17 1917 03/23/17 2222  LATICACIDVEN 1.91* 1.23    Recent Results (from the past 240 hour(s))  Blood culture (routine x 2)     Status: None (Preliminary result)   Collection Time: 03/23/17 10:05 PM  Result Value Ref Range Status   Specimen Description BLOOD BLOOD RIGHT FOREARM  Final   Special Requests   Final    BOTTLES DRAWN AEROBIC AND ANAEROBIC Blood Culture adequate volume   Culture   Final    NO GROWTH 2 DAYS Performed at [facility], [address] [place], [place] [address]    Report Status PENDING  Incomplete  MRSA PCR Screening     Status: Abnormal   Collection Time: 03/23/17  11:37 PM  Result Value Ref Range Status   MRSA by PCR POSITIVE (A) NEGATIVE Final    Comment:        The GeneXpert MRSA Assay (FDA approved for NASAL specimens only), is one component of a comprehensive MRSA colonization surveillance program. It is not intended to diagnose MRSA infection nor to guide or monitor treatment for MRSA infections. RESULT CALLED TO, READ BACK BY AND VERIFIED WITH: RYAN,T RN 7.18.18 @0126  ZANDO,C   Blood culture (routine x 2)     Status: None (Preliminary result)   Collection Time: 03/23/17 11:48 PM  Result Value Ref Range Status   Specimen Description BLOOD BLOOD RIGHT HAND  Final   Special Requests   Final    BOTTLES DRAWN AEROBIC AND ANAEROBIC Blood Culture adequate volume   Culture   Final    NO GROWTH 2 DAYS Performed at [facility], [address] [place], [place] [address]    Report Status PENDING  Incomplete         Radiology Studies: Dg Hip Unilat With Pelvis 2-3 Views Right  Result Date: 03/26/2017 CLINICAL DATA:  Acute on chronic right hip pain. EXAM: DG HIP (WITH OR WITHOUT PELVIS) 2-3V RIGHT COMPARISON:   None. FINDINGS: Bones are diffusely demineralized. SI joints and symphysis pubis are unremarkable. Joint space in the hips is relatively well preserved and symmetric. AP and frog-leg lateral views of the right hip show no evidence for a femoral neck fracture. IMPRESSION: Negative. Electronically Signed   By: Kennith Center M.D.   On: 03/26/2017 17:53        Scheduled Meds: . amLODipine  10 mg Oral Daily  . aspirin EC  81 mg Oral Daily  . carvedilol  50 mg Oral BID  . Chlorhexidine Gluconate Cloth  6 each Topical Q0600  . doxycycline  100 mg Oral Q12H  . enoxaparin (LOVENOX) injection  40 mg Subcutaneous Q24H  . famotidine  20 mg Oral QHS  . insulin aspart  0-9 Units Subcutaneous TID WC  . insulin NPH Human  20 Units Subcutaneous BID AC & HS  . levothyroxine  137 mcg Oral QAC breakfast  . mupirocin ointment  1 application Nasal BID  . mycophenolate  500 mg Oral BID  . nystatin   Topical QID  . predniSONE  5 mg Oral QHS  . tacrolimus  0.5 mg Oral Daily  . tacrolimus  1 mg Oral QHS   Continuous Infusions: . sodium chloride 100 mL (03/27/17 0716)  . cefTRIAXone (ROCEPHIN)  IV Stopped (03/27/17 0211)     LOS: 4 days    Time spent: 35 minutes    Alba Cory, MD [facility] Pager [phone]  If 7PM-7AM, please contact night-coverage [email or web address] Password [email or web address] 03/27/2017, 1:39 PM

## 2017-03-27 NOTE — Consult Note (Addendum)
[name] Admit Date: 03/23/2017 03/27/2017 [name] Requesting Physician:  Dow Adolph MD  Reason for Consult:  AoCKD, s/p renal transplant HPI:  51 year old female admitted 7/17 with concern for lower extremity cellulitis.  PMH Incudes:  Status post renal transplant, deceased donor, [facility] 11, [facility]at [facility]. Immunosuppression of tacrolimus, mycophenolate, prednisone. Follows with Dr. Arrie Aran at [facility] but has difficulty with attending appointments. Baseline creatinine around 1.5.  Diabetes mellitus  Hypothyroidism  Chronic venous stasis, history of recurrent infections of the lower extremities  She was admitted and placed on cefazolin which was transitioned to ceftriaxone. She also is taking doxycycline. She tells me today that her legs are much improved, less edematous, without any bright red erythema, and nonpainful. White blood cell count has not been elevated, most recently was 5.1. Afebrile.  Renal function has been labile but this morning creatinine was increased to 1.6 with normal serum potassium and bicarbonate. Urine analysis is pending. She has been receiving IV fluids, normal saline, and has had 7 unmeasured voids yesterday. No difficulty passing her urine.  Creat (mg/dL)  Date Value  16/06/9603 1.07   Creatinine, Ser (mg/dL)  Date Value  54/05/8118 1.60 (H)  03/26/2017 1.54 (H)  03/25/2017 1.47 (H)  03/24/2017 1.16 (H)  03/23/2017 1.35 (H)  03/22/2017 1.47 (H)  03/03/2017 1.19 (H)  03/02/2017 1.25 (H)  03/01/2017 1.40 (H)  03/01/2017 1.61 (H)  ] ROS NSAIDS: no exposure IV Contrast no exposure TMP/SMX no exposure Hypotension no exposure Balance of 12 systems is negative w/ exceptions as above  PMH  Past Medical History:  Diagnosis Date  . Arthritis    "elbows, knees, legs, back" (09/24/2015)  . CKD (chronic kidney disease)   . Daily headache   . Depression    "years ago"  . End stage renal disease (HCC)    right arm AV graft,  post transplant  . Hypertension   . Hypothyroid   . 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 Kidney disease   . Pneumonia ~ 2007?  [name] Type II diabetes mellitus (HCC)    Insulin dependant   PSH  Past Surgical History:  Procedure Laterality Date  . AMPUTATION Left 05/11/2014   Procedure: AMPUTATION LEFT GREAT TOE;  Surgeon: Kathryne Hitch, MD;  Location: [facility] OR[facility];  Service: Orthopedics;  Laterality: Left;  . AV FISTULA PLACEMENT Right    forearm  . BACK SURGERY    . CATARACT EXTRACTION W/ INTRAOCULAR LENS  IMPLANT, BILATERAL Bilateral   . CESAREAN SECTION  07/1999  . DG AV DIALYSIS GRAFT DECLOT OR    . INCISION AND DRAINAGE Right 06/02/2015   Procedure: INCISION AND DRAINAGE OF RIGHT 3rd RAY RESECTION;  Surgeon: Kathryne Hitch, MD;  Location: [facility] OR;  Service: Orthopedics;  Laterality: Right;  . 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 LUMBAR DISC SURGERY  2001  . NEPHRECTOMY TRANSPLANTED ORGAN    . TOE AMPUTATION Right    1,2 & 3rd toes.  . TUBAL LIGATION  07/1999   FH  Family History  Problem Relation Age of Onset  . Emphysema Mother   . Throat cancer Mother   . COPD Mother   . Cancer Mother   . Emphysema Father   . COPD Father   . Stroke Father   . ADD / ADHD Son    SH  reports that she quit smoking about 18 years ago. Her smoking use included Cigarettes. She has a 11.00 pack-year  smoking history. She has never used smokeless tobacco. She reports that she does not drink alcohol or use drugs. Allergies  Allergies  Allergen Reactions  . Ace Inhibitors Cough  . Adhesive [Tape] Itching and Other (See Comments)    Please use paper tape  . Lisinopril Cough   Home medications Prior to Admission medications   Medication Sig Start Date End Date Taking? Authorizing Provider  acetaminophen (TYLENOL) 500 MG tablet Take 500 mg by mouth every 6 (six) hours as needed for moderate pain.   Yes [provider]  amLODipine (NORVASC) 10 MG  tablet Take 1 tablet (10 mg total) by mouth daily. 08/14/16  Yes Regalado, Belkys A, MD  aspirin EC 81 MG tablet Take 81 mg by mouth daily.   Yes [provider]  carvedilol (COREG) 25 MG tablet Take 50 mg by mouth 2 (two) times daily. 02/26/17  Yes [provider]  insulin NPH-regular Human (NOVOLIN 70/30) (70-30) 100 UNIT/ML injection Inject 40 Units into the skin 2 (two) times daily with a meal. 03/03/17  Yes Hongalgi, Maximino Greenland, MD  levothyroxine (SYNTHROID, LEVOTHROID) 137 MCG tablet Take 1 tablet (137 mcg total) by mouth daily. BRAND NAME ONLY 03/04/15  Yes Alison Murray, MD  mycophenolate (CELLCEPT) 250 MG capsule Take 500 mg by mouth 2 (two) times daily.    Yes [provider]  predniSONE (DELTASONE) 5 MG tablet Take 5 mg by mouth at bedtime.  05/29/15  Yes [provider]  ranitidine (ZANTAC) 150 MG tablet Take 150 mg by mouth at bedtime.  03/27/16  Yes [provider]  tacrolimus (PROGRAF) 0.5 MG capsule Take 0.5-1 mg by mouth 2 (two) times daily. Take 0.5mg  by mouth in the morning and 1mg  by mouth at bedtime   Yes [provider]  acetaminophen (TYLENOL) 325 MG tablet Take 2 tablets (650 mg total) by mouth every 6 (six) hours as needed for mild pain, moderate pain, fever or headache. Patient not taking: Reported on 03/23/2017 05/15/16   Elease Etienne, MD  cephALEXin (KEFLEX) 500 MG capsule Take 1 capsule (500 mg total) by mouth 2 (two) times daily. 03/22/17 03/29/17  Tegeler, Canary Brim, MD    Current Medications Scheduled Meds: . amLODipine  10 mg Oral Daily  . aspirin EC  81 mg Oral Daily  . carvedilol  50 mg Oral BID  . Chlorhexidine Gluconate Cloth  6 each Topical Q0600  . doxycycline  100 mg Oral Q12H  . enoxaparin (LOVENOX) injection  40 mg Subcutaneous Q24H  . famotidine  20 mg Oral QHS  . insulin aspart  0-9 Units Subcutaneous TID WC  . insulin NPH Human  20 Units Subcutaneous BID AC & HS  . levothyroxine  137 mcg Oral QAC  breakfast  . mupirocin ointment  1 application Nasal BID  . mycophenolate  500 mg Oral BID  . nystatin   Topical QID  . predniSONE  5 mg Oral QHS  . tacrolimus  0.5 mg Oral Daily  . tacrolimus  1 mg Oral QHS   Continuous Infusions: . sodium chloride 100 mL (03/27/17 0716)  . cefTRIAXone (ROCEPHIN)  IV Stopped (03/27/17 0211)   PRN Meds:.acetaminophen, ondansetron (ZOFRAN) IV, oxyCODONE  CBC  Recent Labs Lab 03/23/17 1908 03/24/17 0804 03/25/17 0544  WBC 5.0 5.4 5.1  NEUTROABS 3.3 3.9 3.2  HGB 10.2* 10.7* 10.2*  HCT 32.3* 33.3* 30.9*  MCV 89.7 89.3 88.8  PLT 214 215 209   Basic Metabolic Panel  Recent  Labs Lab 03/22/17 1703 03/23/17 1908 03/24/17 0804 03/25/17 0544 03/26/17 0920 03/27/17 0539  NA 138 141 140 135 136 139  K 3.8 3.8 3.9 4.7 4.9 4.8  CL 109 110 107 104 105 107  CO2 23 23 25 25 23 26   GLUCOSE 94 118* 69 194* 170* 78  BUN 20 23* 22* 25* 24* 25*  CREATININE 1.47* 1.35* 1.16* 1.47* 1.54* 1.60*  CALCIUM 9.3 9.6 9.7 9.2 9.2 9.3    Physical Exam  Blood pressure (!) 145/67, pulse 75, temperature 98.5 F (36.9 C), temperature source Oral, resp. rate 16, height 5\' 9"  (1.753 m), last menstrual period 11/19/2011, SpO2 95 %. GEN: NAD, chronically ill appearing ENT: NCAT EYES: EOMI CV: RRR, nl s1s2 PULM: CTAB ABD: s/nt/nd, no graft tenderness SKIN:  Bilateral hyperpigmentation of the distal lower extremities, no erythema at the current time. No draining wounds. EXT: 1+ lower extremity edema bilaterally NEURO: nonfocal   Assessment 56F with LE cellulitis, improving; mild AoCKD3 in setting of 8y old ddKT on MMF/Pred/Tac  1. Mild AoCKD3 2. S/p dd KT 2011 on chronic immunosuppression 3. Lower extremity sialitis improving on ceftriaxone and doxycycline 4. diabetes mellitus  Plan 1.  no change to immunosuppression at the current time 2. I don't think that checking a Prograf level will be helpful, further therapy will be quite a delay in receiving  result. 3. Antibiotic dosing is appropriate 4. Okay to continue hydration for another 24 hours 5. Daily weights, Daily Renal Panel, Strict I/Os, Avoid nephrotoxins (NSAIDs, judicious IV Contrast) 6. Will see again tomorrow   Sabra Heckyan Romel Dumond MD [phone] [phone] 03/27/2017, 12:45 PM

## 2017-03-28 DIAGNOSIS — E162 Hypoglycemia, unspecified: Secondary | ICD-10-CM

## 2017-03-28 LAB — HEMOGLOBIN AND HEMATOCRIT, BLOOD
HCT: 32.9 % — ABNORMAL LOW (ref 36.0–46.0)
HEMOGLOBIN: 10.4 g/dL — AB (ref 12.0–15.0)

## 2017-03-28 LAB — BASIC METABOLIC PANEL
Anion gap: 7 (ref 5–15)
BUN: 20 mg/dL (ref 6–20)
CALCIUM: 9.5 mg/dL (ref 8.9–10.3)
CO2: 23 mmol/L (ref 22–32)
CREATININE: 1.3 mg/dL — AB (ref 0.44–1.00)
Chloride: 111 mmol/L (ref 101–111)
GFR, EST AFRICAN AMERICAN: 54 mL/min — AB (ref >60)
GFR, EST NON AFRICAN AMERICAN: 47 mL/min — AB (ref >60)
Glucose, Bld: 77 mg/dL (ref 65–99)
POTASSIUM: 4.4 mmol/L (ref 3.5–5.1)
Sodium: 141 mmol/L (ref 135–145)

## 2017-03-28 LAB — TSH: TSH: 4.821 u[IU]/mL — ABNORMAL HIGH (ref 0.350–4.500)

## 2017-03-28 LAB — GLUCOSE, CAPILLARY
GLUCOSE-CAPILLARY: 166 mg/dL — AB (ref 65–99)
GLUCOSE-CAPILLARY: 108 mg/dL — AB (ref 65–99)
Glucose-Capillary: 43 mg/dL — CL (ref 65–99)
Glucose-Capillary: 75 mg/dL (ref 65–99)
Glucose-Capillary: 78 mg/dL (ref 65–99)
Glucose-Capillary: 102 mg/dL — ABNORMAL HIGH (ref 65–99)

## 2017-03-28 LAB — T4, FREE: Free T4: 1.23 ng/dL — ABNORMAL HIGH (ref 0.61–1.12)

## 2017-03-28 MED ORDER — CYANOCOBALAMIN 1000 MCG/ML IJ SOLN
1000.0000 ug | Freq: Once | INTRAMUSCULAR | Status: AC
  Administered 2017-03-28: 1000 ug via INTRAMUSCULAR
  Filled 2017-03-28 (×2): qty 1

## 2017-03-28 MED ORDER — INSULIN NPH (HUMAN) (ISOPHANE) 100 UNIT/ML ~~LOC~~ SUSP
10.0000 [IU] | Freq: Two times a day (BID) | SUBCUTANEOUS | Status: DC
  Administered 2017-03-28 – 2017-03-29 (×3): 10 [IU] via SUBCUTANEOUS
  Filled 2017-03-28: qty 10

## 2017-03-28 MED ORDER — CEPHALEXIN 500 MG PO CAPS: 500.0000 mg | ORAL_CAPSULE | Freq: Two times a day (BID) | ORAL | Status: DC

## 2017-03-28 MED ORDER — CEPHALEXIN 500 MG PO CAPS
500.0000 mg | ORAL_CAPSULE | Freq: Four times a day (QID) | ORAL | Status: DC
  Administered 2017-03-28 – 2017-03-29 (×3): 500 mg via ORAL
  Filled 2017-03-28 (×3): qty 1

## 2017-03-28 NOTE — Progress Notes (Signed)
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 amLODipine  10 mg Oral Daily  . aspirin EC  81 mg Oral Daily  . carvedilol  50 mg Oral BID  . Chlorhexidine Gluconate Cloth  6 each Topical Q0600  . cyanocobalamin  1,000 mcg Intramuscular Once  . doxycycline  100 mg Oral Q12H  . enoxaparin (LOVENOX) injection  40 mg Subcutaneous Q24H  . famotidine  20 mg Oral QHS  . insulin aspart  0-9 Units Subcutaneous TID WC  . insulin NPH Human  10 Units Subcutaneous BID AC & HS  . levothyroxine  137 mcg Oral QAC breakfast  . mupirocin ointment  1 application Nasal BID  . mycophenolate  500 mg Oral BID  . nystatin   Topical QID  . predniSONE  5 mg Oral QHS  . tacrolimus  0.5 mg Oral Daily  . tacrolimus  1 mg Oral QHS   Continuous Infusions: . sodium chloride 100 mL/hr at 03/27/17 1624  . cefTRIAXone (ROCEPHIN)  IV Stopped (03/28/17 0007)   PRN Meds:.acetaminophen, ondansetron (ZOFRAN) IV  Current Labs: reviewed    Physical Exam:  Blood pressure (!) 146/68, pulse 65, temperature 98.4 F (36.9 C), temperature source Oral, resp. rate 16, height 5\' 9"  (1.753 m), weight 76.8 kg (169 lb 5 oz), last menstrual period 11/19/2011, SpO2 97 %. GEN: NAD, chronically ill appearing ENT: NCAT EYES: EOMI CV: RRR, nl s1s2 PULM: CTAB ABD: s/nt/nd, no graft tenderness SKIN:  Bilateral hyperpigmentation of the distal lower extremities, no erythema at the current time. No draining wounds. EXT: 1+ lower extremity edema bilaterally NEURO: nonfocal  A 1. Mild AoCKD3 2. S/p dd KT 2011 on chronic immunosuppression 3. Lower extremity sialitis improving on ceftriaxone and  doxycycline 4. diabetes mellitus  P 1. Improved, cont outpt IS regimen 2. No need to move up outpt f/u; I discussed importance of keeping appts with Dr. Arrie Aranoladonato. 3. Daily weights, Daily Renal Panel, Strict I/Os, Avoid nephrotoxins (NSAIDs, judicious IV Contrast) 4. Will sign off for now.   Sabra Heckyan Mattew Chriswell MD 03/28/2017, 11:50 AM   Recent Labs Lab 03/26/17 0920 03/27/17 0539 03/28/17 0544  NA 136 139 141  K 4.9 4.8 4.4  CL 105 107 111  CO2 23 26 23   GLUCOSE 170* 78 77  BUN 24* 25* 20  CREATININE 1.54* 1.60* 1.30*  CALCIUM 9.2 9.3 9.5    Recent Labs Lab 03/23/17 1908 03/24/17 0804 03/25/17 0544 03/28/17 0544  WBC 5.0 5.4 5.1  --   NEUTROABS 3.3 3.9 3.2  --   HGB 10.2* 10.7* 10.2* 10.4*  HCT 32.3* 33.3* 30.9* 32.9*  MCV 89.7 89.3 88.8  --   PLT 214 215 209  --

## 2017-03-28 NOTE — Progress Notes (Signed)
PROGRESS NOTE    [name]  AVW:[id number] DOB: 08-10-1966 DOA: 03/23/2017 PCP: Dois Davenportichter, Karen L, MD    Brief Narrative: [name] is a 51 y.o. female with medical history significant of osteoarthritis, chronic kidney disease, headaches, depression, hypertension, hypothyroidism, kidney transplant, immunosuppression due to transplant medications, history of pneumonia, insulin requiring type 2 diabetes who is coming to the emergency department via EMS after becoming hypoglycemic with a blood glucose in the 30s at home. She also complains of recurrent bilateral lower extremities infection. She was seen yesterday at [facility] emergency department for mild hypoglycemia, was also diagnosed with cellulitis and discharged home on oral antibiotics. She denies fever, chills, earaches, sore throat or rhinorrhea. However she complains of dizziness, frontal headaches and nasal congestion. She denies chest pain, palpitations, dyspnea, diaphoresis, pitting edema of the lower extremities PND or orthopnea. Denies abdominal pain, nausea, emesis, diarrhea, constipation, melena or hematochezia. She denies dysuria or hematuria.   ED Course: Her glucose improved with oral intake. Her initial vital signs temperature 98.3F, pulse 76, blood pressure 141/79 mmHg, respirations 16 and O2 sat 97% on room air. WBC was 5, hemoglobin 10.2 g/dL and platelets [id number]. Her BNP was 261 pg/mL. Sodium 141, potassium 3.8, chloride 110, bicarbonate 23 mmol/L. BUN was 20, creatinine 1.35 and glucose 118 mg/dL. Her LFTs shows hypomagnesemia 2.7 g/dL and low transaminases level. Chest radiograph shows bronchitic changes. She was given a dose of cefazolin in the emergency department.  Assessment & Plan:   Principal Problem:   Lower extremity cellulitis Active Problems:   Hypothyroidism   H/O kidney transplant   Essential hypertension   GERD (gastroesophageal reflux disease)   Hypomagnesemia   Hypoglycemia   Anemia   Type II  diabetes mellitus (HCC)  1-Right Lower extremity cellulitis on chronic venous stasis.  Continue with doxy to cover for MRSA.  Will discontinue ceftriaxone. Start keflex Doppler negative.  Follow blood culture. No growth to date  Improving.   Weak, tired, sleepy; will hold oxycodone.  B- 12 low, started supplement.  Will check TSH, Vitamin D  Dysphagia; esophagogram ordered.   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   Hip pain;  Better. X ray negative  2-type 2 DM, hypoglycemia. Hold 70/30. SSI.  Decreased   70/30 10 units BID> due to low blood sugar  3- Hypomagnesemia; received 2 gr.  Mag at 1.9  4-AKI  H/O kidney transplant Continue mycophenolate 500 mg by mouth twice a day. Continue prednisone 5 mg by mouth at bedtime. Continue tacrolimus 0.5-1 mg by mouth twice a day. Worsening kidney function. Will consult renal.   Hypothyroidism; continue with synthroid.   Essential hypertension Continue amlodipine 10 mg by mouth daily. Continue carvedilol 12.5 mg by mouth twice a day. Monitor blood pressure.  Anemia Hb stable at 10.,      GERD (gastroesophageal reflux disease). Pantoprazole 40 mg by mouth daily    DVT prophylaxis: lovenox Code Status: full code.  Family Communication: care discussed with patient.  Disposition Plan: home in 24 to 48 hours.   Consultants:   none   Procedures: doppler; negative for DVT    Antimicrobials:   Ceftriaxone  Doxy    Subjective: Still feels tired.  Tolerated full liquid diet. Will advance to carb modified  Objective: Vitals:   03/27/17 2104 03/28/17 0516 03/28/17 1100 03/28/17 1400  BP: (!) 127/56 (!) 146/68 (!) 158/71 (!) 154/66  Pulse: 74 65 77 78  Resp: 16 16  16   Temp: 98.4  F (36.9 C)   98.7 F (37.1 C)  TempSrc: Oral Oral  Oral  SpO2: 90% 97% 94% 98%  Weight:      Height:        Intake/Output Summary (Last 24 hours) at 03/28/17 1501 Last data filed at 03/28/17 0300  Gross per 24 hour  Intake              1640 ml  Output              800 ml  Net              840 ml   Filed Weights   03/25/17 0453  Weight: 76.8 kg (169 lb 5 oz)    Examination:  General exam: NAD Respiratory system: CTA Cardiovascular system: S 1, S 2 RRR Gastrointestinal system: BS present, soft, nt Central nervous system: Non focal.  Extremities: symmetric power. . Multiples toe amputation  Skin: right leg with no edema , chronic hyperpigmentation, redness improved.    Data Reviewed: I have personally reviewed following labs and imaging studies  CBC:  Recent Labs Lab 03/22/17 1703 03/23/17 1908 03/24/17 0804 03/25/17 0544 03/28/17 0544  WBC 4.4 5.0 5.4 5.1  --   NEUTROABS 2.5 3.3 3.9 3.2  --   HGB 9.7* 10.2* 10.7* 10.2* 10.4*  HCT 31.0* 32.3* 33.3* 30.9* 32.9*  MCV 89.1 89.7 89.3 88.8  --   PLT 216 214 215 209  --    Basic Metabolic Panel:  Recent Labs Lab 03/23/17 1908 03/24/17 0804 03/25/17 0544 03/26/17 0920 03/27/17 0539 03/28/17 0544  NA 141 140 135 136 139 141  K 3.8 3.9 4.7 4.9 4.8 4.4  CL 110 107 104 105 107 111  CO2 23 25 25 23 26 23   GLUCOSE 118* 69 194* 170* 78 77  BUN 23* 22* 25* 24* 25* 20  CREATININE 1.35* 1.16* 1.47* 1.54* 1.60* 1.30*  CALCIUM 9.6 9.7 9.2 9.2 9.3 9.5  MG 1.6*  --  1.9  --   --   --    GFR: Estimated Creatinine Clearance: 54.1 mL/min (A) (by C-G formula based on SCr of 1.3 mg/dL (H)). Liver Function Tests:  Recent Labs Lab 03/22/17 1703 03/23/17 1908  AST 13* 14*  ALT 8* 11*  ALKPHOS 99 110  BILITOT 0.3 0.3  PROT 5.8* 6.5  ALBUMIN 2.4* 2.7*   No results for input(s): LIPASE, AMYLASE in the last 168 hours. No results for input(s): AMMONIA in the last 168 hours. Coagulation Profile: No results for input(s): INR, PROTIME in the last 168 hours. Cardiac Enzymes: No results for input(s): CKTOTAL, CKMB, CKMBINDEX, TROPONINI in the last 168 hours. BNP (last 3 results) No results for input(s): PROBNP in the last 8760 hours. HbA1C: No  results for input(s): HGBA1C in the last 72 hours. CBG:  Recent Labs Lab 03/27/17 2110 03/28/17 0456 03/28/17 0526 03/28/17 0715 03/28/17 1133  GLUCAP 125* 43* 75 78 102*   Lipid Profile: No results for input(s): CHOL, HDL, LDLCALC, TRIG, CHOLHDL, LDLDIRECT in the last 72 hours. Thyroid Function Tests:  Recent Labs  03/28/17 1140  TSH 4.821*   Anemia Panel: No results for input(s): VITAMINB12, FOLATE, FERRITIN, TIBC, IRON, RETICCTPCT in the last 72 hours. Sepsis Labs:  Recent Labs Lab 03/23/17 1917 03/23/17 2222  LATICACIDVEN 1.91* 1.23    Recent Results (from the past 240 hour(s))  Blood culture (routine x 2)     Status: None (Preliminary result)   Collection Time: 03/23/17 10:05 PM  Result Value Ref Range Status   Specimen Description BLOOD BLOOD RIGHT FOREARM  Final   Special Requests   Final    BOTTLES DRAWN AEROBIC AND ANAEROBIC Blood Culture adequate volume   Culture   Final    NO GROWTH 4 DAYS Performed at [facility], [address], [place], [place] [address]    Report Status PENDING  Incomplete  MRSA PCR Screening     Status: Abnormal   Collection Time: 03/23/17 11:37 PM  Result Value Ref Range Status   MRSA by PCR POSITIVE (A) NEGATIVE Final    Comment:        The GeneXpert MRSA Assay (FDA approved for NASAL specimens only), is one component of a comprehensive MRSA colonization surveillance program. It is not intended to diagnose MRSA infection nor to guide or monitor treatment for MRSA infections. RESULT CALLED TO, READ BACK BY AND VERIFIED WITH: RYAN,T RN 7.18.18 @0126  ZANDO,C   Blood culture (routine x 2)     Status: None (Preliminary result)   Collection Time: 03/23/17 11:48 PM  Result Value Ref Range Status   Specimen Description BLOOD BLOOD RIGHT HAND  Final   Special Requests   Final    BOTTLES DRAWN AEROBIC AND ANAEROBIC Blood Culture adequate volume   Culture   Final    NO GROWTH 4 DAYS Performed at [facility]  Lab, [address], [place], [place] [address]    Report Status PENDING  Incomplete         Radiology Studies: Dg Hip Unilat With Pelvis 2-3 Views Right  Result Date: 03/26/2017 CLINICAL DATA:  Acute on chronic right hip pain. EXAM: DG HIP (WITH OR WITHOUT PELVIS) 2-3V RIGHT COMPARISON:  None. FINDINGS: Bones are diffusely demineralized. SI joints and symphysis pubis are unremarkable. Joint space in the hips is relatively well preserved and symmetric. AP and frog-leg lateral views of the right hip show no evidence for a femoral neck fracture. IMPRESSION: Negative. Electronically Signed   By: Kennith Center M.D.   On: 03/26/2017 17:53        Scheduled Meds: . amLODipine  10 mg Oral Daily  . aspirin EC  81 mg Oral Daily  . carvedilol  50 mg Oral BID  . Chlorhexidine Gluconate Cloth  6 each Topical Q0600  . doxycycline  100 mg Oral Q12H  . enoxaparin (LOVENOX) injection  40 mg Subcutaneous Q24H  . famotidine  20 mg Oral QHS  . insulin aspart  0-9 Units Subcutaneous TID WC  . insulin NPH Human  10 Units Subcutaneous BID AC & HS  . levothyroxine  137 mcg Oral QAC breakfast  . mupirocin ointment  1 application Nasal BID  . mycophenolate  500 mg Oral BID  . nystatin   Topical QID  . predniSONE  5 mg Oral QHS  . tacrolimus  0.5 mg Oral Daily  . tacrolimus  1 mg Oral QHS   Continuous Infusions: . sodium chloride 100 mL/hr at 03/27/17 1624  . cefTRIAXone (ROCEPHIN)  IV Stopped (03/28/17 0007)     LOS: 5 days    Time spent: 35 minutes    Alba Cory, MD [facility] Pager [phone]  If 7PM-7AM, please contact night-coverage [email or web address] Password [facility] 03/28/2017, 3:01 PM

## 2017-03-28 NOTE — Progress Notes (Signed)
CRITICAL VALUE ALERT  Critical Value:  CBG 43  Date & Time Notied:  03/28/17 0504  Provider Notified: T. Opyd  Orders Received/Actions taken:  NPH dosage lowered to 10 units from 20 units

## 2017-03-29 LAB — CULTURE, BLOOD (ROUTINE X 2)
CULTURE: NO GROWTH
CULTURE: NO GROWTH
Special Requests: ADEQUATE
Special Requests: ADEQUATE

## 2017-03-29 LAB — T3, FREE: T3 FREE: 1.9 pg/mL — AB (ref 2.0–4.4)

## 2017-03-29 LAB — GLUCOSE, CAPILLARY
Glucose-Capillary: 198 mg/dL — ABNORMAL HIGH (ref 65–99)
Glucose-Capillary: 248 mg/dL — ABNORMAL HIGH (ref 65–99)

## 2017-03-29 LAB — VITAMIN D 25 HYDROXY (VIT D DEFICIENCY, FRACTURES): VIT D 25 HYDROXY: 10 ng/mL — AB (ref 30.0–100.0)

## 2017-03-29 MED ORDER — LEVOTHYROXINE SODIUM 150 MCG PO TABS: 150.0000 ug | ORAL_TABLET | Freq: Every day | ORAL | 0 refills | Status: DC

## 2017-03-29 MED ORDER — CYANOCOBALAMIN 1000 MCG/ML IJ SOLN
1000.0000 ug | Freq: Once | INTRAMUSCULAR | Status: AC
  Administered 2017-03-29: 1000 ug via INTRAMUSCULAR
  Filled 2017-03-29: qty 1

## 2017-03-29 MED ORDER — VITAMIN B-12 100 MCG PO TABS: 100.0000 ug | ORAL_TABLET | Freq: Every day | ORAL | 0 refills | Status: AC

## 2017-03-29 MED ORDER — CEPHALEXIN 500 MG PO CAPS: 500.0000 mg | ORAL_CAPSULE | Freq: Four times a day (QID) | ORAL | 0 refills | Status: DC

## 2017-03-29 MED ORDER — INSULIN NPH ISOPHANE & REGULAR (70-30) 100 UNIT/ML ~~LOC~~ SUSP: 13.0000 [IU] | Freq: Two times a day (BID) | SUBCUTANEOUS | 6 refills | Status: DC

## 2017-03-29 MED ORDER — NYSTATIN 100000 UNIT/GM EX POWD: Freq: Four times a day (QID) | CUTANEOUS | 0 refills | Status: DC

## 2017-03-29 MED ORDER — LEVOTHYROXINE SODIUM 150 MCG PO TABS: 150.0000 ug | ORAL_TABLET | Freq: Every day | ORAL | Status: DC

## 2017-03-29 MED ORDER — DOXYCYCLINE HYCLATE 100 MG PO TABS: 100.0000 mg | ORAL_TABLET | Freq: Two times a day (BID) | ORAL | 0 refills | Status: DC

## 2017-03-29 NOTE — Discharge Summary (Signed)
Physician Discharge Summary  [name]:[id number] DOB: 1965/09/10 DOA: 03/23/2017  PCP: Dois Davenport, MD  Admit date: 03/23/2017 Discharge date: 03/29/2017  Admitted From: Home  Disposition:  Home   Recommendations for Outpatient Follow-up:  1. Follow up with PCP in 1-2 weeks 2. Please obtain BMP/CBC in one week 3. Adjust insulin As needed.  4. Recommended vascular FU for claudication symptoms.  5. Needs B 12 supplements.  6. Please follow Vitamin D level.     Discharge Condition: Stable.  CODE STATUS: Full code.  Diet recommendation: Carb Modified   Brief/Interim Summary: [name] a 51 y.o.femalewith medical history significant of osteoarthritis, chronic kidney disease, headaches, depression, hypertension, hypothyroidism, kidney transplant, immunosuppression due to transplant medications, history of pneumonia, insulin requiring type 2 diabetes who is coming to the emergency department via EMS after becoming hypoglycemic with a blood glucose in the 30s at home. She also complains of recurrent bilateral lower extremities infection. She was seen yesterday at [facility] emergency department for mild hypoglycemia, was also diagnosed with cellulitis and discharged home on oral antibiotics.She denies fever, chills, earaches, sore throat or rhinorrhea. However she complains of dizziness, frontal headaches and nasal congestion. She denies chest pain, palpitations, dyspnea, diaphoresis, pitting edema of the lower extremities PND or orthopnea. Denies abdominal pain, nausea, emesis, diarrhea, constipation, melena or hematochezia. She denies dysuria or hematuria.   ED Course:Her glucose improved with oral intake. Her initial vital signs temperature 98.80F, pulse 76, blood pressure 141/79 mmHg, respirations 16 and O2 sat 97% on room air. WBC was 5, hemoglobin 10.2 g/dL and platelets 811. Her BNP was 261 pg/mL. Sodium 141, potassium 3.8, chloride 110, bicarbonate 23 mmol/L. BUN was  20, creatinine 1.35 and glucose 118 mg/dL. Her LFTs shows hypomagnesemia 2.7 g/dL and low transaminases level. Chest radiograph shows bronchitic changes. She was given a dose of cefazolin in the emergency department.  Assessment & Plan:   Principal Problem:   Lower extremity cellulitis Active Problems:   Hypothyroidism   H/O kidney transplant   Essential hypertension   GERD (gastroesophageal reflux disease)   Hypomagnesemia   Hypoglycemia   Anemia   Type II diabetes mellitus (HCC)  1-Right Lower extremity cellulitis on chronic venous stasis.  Continue with doxy to cover for MRSA.  Will discontinue ceftriaxone. Start keflex Doppler negative.  Follow blood culture. No growth to date  Improving. Discharge on 4 more days of antibiotics.   Fatigue;   B- 12 low, started supplement.  Free T 3 low will increase synthroid, Vitamin D pending   Dysphagia;resolved.   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 Received 2 dose IM 1000 in the hospital Discharge on oral supplement,  Hip pain;  Better. X ray negative  2-type 2 DM, hypoglycemia. Hold 70/30. SSI.  Decreased   70/30 10 units BID> due to low blood sugar  3- Hypomagnesemia; received 2 gr.  Mag at 1.9  4-AKIH/O kidney transplant Continue mycophenolate 500 mg by mouth twice a day. Continue prednisone 5 mg by mouth at bedtime. Continue tacrolimus 0.5-1 mg by mouth twice a day. improved with IV fluids.   Hypothyroidism; increase  Synthroid dose due to low Free T 3  Essential hypertension Continue amlodipine 10 mg by mouth daily. Continue carvedilol 12.5 mg by mouth twice a day. Monitor blood pressure.  Anemia Hb stable at 10.,    GERD (gastroesophageal reflux disease). Pantoprazole 40 mg by mouth daily     Discharge Diagnoses:  Principal Problem:   Lower  extremity cellulitis Active Problems:   Hypothyroidism   H/O kidney transplant   Essential hypertension   GERD (gastroesophageal reflux  disease)   Hypomagnesemia   Hypoglycemia   Anemia   Type II diabetes mellitus (HCC)    Discharge Instructions  Discharge Instructions    Diet - low sodium heart healthy    Complete by:  As directed    Increase activity slowly    Complete by:  As directed      Allergies as of 03/29/2017      Reactions   Ace Inhibitors Cough   Adhesive [tape] Itching, Other (See Comments)   Please use paper tape   Lisinopril Cough      Medication List    TAKE these medications   acetaminophen 325 MG tablet Commonly known as:  TYLENOL Take 2 tablets (650 mg total) by mouth every 6 (six) hours as needed for mild pain, moderate pain, fever or headache. What changed:  Another medication with the same name was removed. Continue taking this medication, and follow the directions you see here.   amLODipine 10 MG tablet Commonly known as:  NORVASC Take 1 tablet (10 mg total) by mouth daily.   aspirin EC 81 MG tablet Take 81 mg by mouth daily.   carvedilol 25 MG tablet Commonly known as:  COREG Take 50 mg by mouth 2 (two) times daily.   cephALEXin 500 MG capsule Commonly known as:  KEFLEX Take 1 capsule (500 mg total) by mouth 4 (four) times daily. What changed:  when to take this   doxycycline 100 MG tablet Commonly known as:  VIBRA-TABS Take 1 tablet (100 mg total) by mouth every 12 (twelve) hours.   insulin NPH-regular Human (70-30) 100 UNIT/ML injection Commonly known as:  NOVOLIN 70/30 Inject 13 Units into the skin 2 (two) times daily with a meal. What changed:  how much to take   levothyroxine 150 MCG tablet Commonly known as:  SYNTHROID, LEVOTHROID Take 1 tablet (150 mcg total) by mouth daily before breakfast. What changed:  medication strength  how much to take  when to take this  additional instructions   mycophenolate 250 MG capsule Commonly known as:  CELLCEPT Take 500 mg by mouth 2 (two) times daily.   nystatin powder Commonly known as:   MYCOSTATIN/NYSTOP Apply topically 4 (four) times daily.   predniSONE 5 MG tablet Commonly known as:  DELTASONE Take 5 mg by mouth at bedtime.   ranitidine 150 MG tablet Commonly known as:  ZANTAC Take 150 mg by mouth at bedtime.   tacrolimus 0.5 MG capsule Commonly known as:  PROGRAF Take 0.5-1 mg by mouth 2 (two) times daily. Take 0.5mg  by mouth in the morning and 1mg  by mouth at bedtime   vitamin B-12 100 MCG tablet Commonly known as:  CYANOCOBALAMIN Take 1 tablet (100 mcg total) by mouth daily.       Allergies  Allergen Reactions  . Ace Inhibitors Cough  . Adhesive [Tape] Itching and Other (See Comments)    Please use paper tape  . Lisinopril Cough    Consultations:  Renal    Procedures/Studies: Dg Chest 2 View  Result Date: 03/23/2017 CLINICAL DATA:  Shortness of breath EXAM: CHEST  2 VIEW COMPARISON:  03/01/2017 FINDINGS: Interstitial coarsening that is favored bronchitic. No Kerley lines or air bronchogram. No effusion or pneumothorax. Normal heart size and aortic contours. IMPRESSION: Bronchitic type markings without collapse or consolidation. Electronically Signed   By: Kathrynn Ducking.D.  On: 03/23/2017 19:36   Dg Chest Port 1 View  Result Date: 03/01/2017 CLINICAL DATA:  DKA, hypertension, diabetes mellitus EXAM: PORTABLE CHEST 1 VIEW COMPARISON:  Portable exam 0721 hours compared to 01/26/2017 FINDINGS: Normal heart size, mediastinal contours, and pulmonary vascularity. Atherosclerotic calcification aorta. Lungs clear. No pleural effusion or pneumothorax. Bones unremarkable. IMPRESSION: No acute abnormalities. Aortic Atherosclerosis (ICD10-I70.0). Electronically Signed   By: Ulyses SouthwardMark  Boles M.D.   On: 03/01/2017 07:36   Dg Hip Unilat With Pelvis 2-3 Views Right  Result Date: 03/26/2017 CLINICAL DATA:  Acute on chronic right hip pain. EXAM: DG HIP (WITH OR WITHOUT PELVIS) 2-3V RIGHT COMPARISON:  None. FINDINGS: Bones are diffusely demineralized. SI joints and  symphysis pubis are unremarkable. Joint space in the hips is relatively well preserved and symmetric. AP and frog-leg lateral views of the right hip show no evidence for a femoral neck fracture. IMPRESSION: Negative. Electronically Signed   By: Kennith CenterEric  Mansell M.D.   On: 03/26/2017 17:53    (Echo, Carotid, EGD, Colonoscopy, ERCP)    Subjective:   Discharge Exam: Vitals:   03/28/17 2106 03/29/17 0500  BP: (!) 149/77 138/69  Pulse: 73 70  Resp: 20 20  Temp: 97.9 F (36.6 C) 97.9 F (36.6 C)   Vitals:   03/28/17 1100 03/28/17 1400 03/28/17 2106 03/29/17 0500  BP: (!) 158/71 (!) 154/66 (!) 149/77 138/69  Pulse: 77 78 73 70  Resp:  16 20 20   Temp:  98.7 F (37.1 C) 97.9 F (36.6 C) 97.9 F (36.6 C)  TempSrc:  Oral Oral Oral  SpO2: 94% 98% 94% 94%  Weight:      Height:        General: Pt is alert, awake, not in acute distress Cardiovascular: RRR, S1/S2 +, no rubs, no gallops Respiratory: CTA bilaterally, no wheezing, no rhonchi Abdominal: Soft, NT, ND, bowel sounds + Extremities: no edema, erythema improved, chronic hyperpigmentation. Toes amputation.     The results of significant diagnostics from this hospitalization (including imaging, microbiology, ancillary and laboratory) are listed below for reference.     Microbiology: Recent Results (from the past 240 hour(s))  Blood culture (routine x 2)     Status: None (Preliminary result)   Collection Time: 03/23/17 10:05 PM  Result Value Ref Range Status   Specimen Description BLOOD BLOOD RIGHT FOREARM  Final   Special Requests   Final    BOTTLES DRAWN AEROBIC AND ANAEROBIC Blood Culture adequate volume   Culture   Final    NO GROWTH 4 DAYS Performed at [facility], [address] [place], [place] [address]    Report Status PENDING  Incomplete  MRSA PCR Screening     Status: Abnormal   Collection Time: 03/23/17 11:37 PM  Result Value Ref Range Status   MRSA by PCR POSITIVE (A) NEGATIVE Final    Comment:         The GeneXpert MRSA Assay (FDA approved for NASAL specimens only), is one component of a comprehensive MRSA colonization surveillance program. It is not intended to diagnose MRSA infection nor to guide or monitor treatment for MRSA infections. RESULT CALLED TO, READ BACK BY AND VERIFIED WITH: RYAN,T RN 7.18.18 @0126  ZANDO,C   Blood culture (routine x 2)     Status: None (Preliminary result)   Collection Time: 03/23/17 11:48 PM  Result Value Ref Range Status   Specimen Description BLOOD BLOOD RIGHT HAND  Final   Special Requests   Final    BOTTLES  DRAWN AEROBIC AND ANAEROBIC Blood Culture adequate volume   Culture   Final    NO GROWTH 4 DAYS Performed at [facility], [address], [place], [place] [address]    Report Status PENDING  Incomplete     Labs: BNP (last 3 results)  Recent Labs  03/22/17 2219 03/23/17 1908  BNP 218.0* 261.5*   Basic Metabolic Panel:  Recent Labs Lab 03/23/17 1908 03/24/17 0804 03/25/17 0544 03/26/17 0920 03/27/17 0539 03/28/17 0544  NA 141 140 135 136 139 141  K 3.8 3.9 4.7 4.9 4.8 4.4  CL 110 107 104 105 107 111  CO2 23 25 25 23 26 23   GLUCOSE 118* 69 194* 170* 78 77  BUN 23* 22* 25* 24* 25* 20  CREATININE 1.35* 1.16* 1.47* 1.54* 1.60* 1.30*  CALCIUM 9.6 9.7 9.2 9.2 9.3 9.5  MG 1.6*  --  1.9  --   --   --    Liver Function Tests:  Recent Labs Lab 03/22/17 1703 03/23/17 1908  AST 13* 14*  ALT 8* 11*  ALKPHOS 99 110  BILITOT 0.3 0.3  PROT 5.8* 6.5  ALBUMIN 2.4* 2.7*   No results for input(s): LIPASE, AMYLASE in the last 168 hours. No results for input(s): AMMONIA in the last 168 hours. CBC:  Recent Labs Lab 03/22/17 1703 03/23/17 1908 03/24/17 0804 03/25/17 0544 03/28/17 0544  WBC 4.4 5.0 5.4 5.1  --   NEUTROABS 2.5 3.3 3.9 3.2  --   HGB 9.7* 10.2* 10.7* 10.2* 10.4*  HCT 31.0* 32.3* 33.3* 30.9* 32.9*  MCV 89.1 89.7 89.3 88.8  --   PLT 216 214 215 209  --    Cardiac Enzymes: No results for  input(s): CKTOTAL, CKMB, CKMBINDEX, TROPONINI in the last 168 hours. BNP: Invalid input(s): POCBNP CBG:  Recent Labs Lab 03/28/17 0715 03/28/17 1133 03/28/17 1651 03/28/17 2111 03/29/17 0727  GLUCAP 78 102* 166* 108* 198*   D-Dimer No results for input(s): DDIMER in the last 72 hours. Hgb A1c No results for input(s): HGBA1C in the last 72 hours. Lipid Profile No results for input(s): CHOL, HDL, LDLCALC, TRIG, CHOLHDL, LDLDIRECT in the last 72 hours. Thyroid function studies  Recent Labs  03/28/17 1140 03/28/17 1623  TSH 4.821*  --   T3FREE  --  1.9*   Anemia work up No results for input(s): VITAMINB12, FOLATE, FERRITIN, TIBC, IRON, RETICCTPCT in the last 72 hours. Urinalysis    Component Value Date/Time   COLORURINE YELLOW 03/27/2017 1158   APPEARANCEUR CLEAR 03/27/2017 1158   LABSPEC 1.013 03/27/2017 1158   PHURINE 5.0 03/27/2017 1158   GLUCOSEU 50 (A) 03/27/2017 1158   HGBUR NEGATIVE 03/27/2017 1158   BILIRUBINUR NEGATIVE 03/27/2017 1158   KETONESUR NEGATIVE 03/27/2017 1158   PROTEINUR 100 (A) 03/27/2017 1158   UROBILINOGEN 0.2 03/02/2015 1153   NITRITE NEGATIVE 03/27/2017 1158   LEUKOCYTESUR NEGATIVE 03/27/2017 1158   Sepsis Labs Invalid input(s): PROCALCITONIN,  WBC,  LACTICIDVEN Microbiology Recent Results (from the past 240 hour(s))  Blood culture (routine x 2)     Status: None (Preliminary result)   Collection Time: 03/23/17 10:05 PM  Result Value Ref Range Status   Specimen Description BLOOD BLOOD RIGHT FOREARM  Final   Special Requests   Final    BOTTLES DRAWN AEROBIC AND ANAEROBIC Blood Culture adequate volume   Culture   Final    NO GROWTH 4 DAYS Performed at [facility], [address], [place], [place] [address]  Report Status PENDING  Incomplete  MRSA PCR Screening     Status: Abnormal   Collection Time: 03/23/17 11:37 PM  Result Value Ref Range Status   MRSA by PCR POSITIVE (A) NEGATIVE Final    Comment:        The GeneXpert  MRSA Assay (FDA approved for NASAL specimens only), is one component of a comprehensive MRSA colonization surveillance program. It is not intended to diagnose MRSA infection nor to guide or monitor treatment for MRSA infections. RESULT CALLED TO, READ BACK BY AND VERIFIED WITH: RYAN,T RN 7.18.18 @0126  ZANDO,C   Blood culture (routine x 2)     Status: None (Preliminary result)   Collection Time: 03/23/17 11:48 PM  Result Value Ref Range Status   Specimen Description BLOOD BLOOD RIGHT HAND  Final   Special Requests   Final    BOTTLES DRAWN AEROBIC AND ANAEROBIC Blood Culture adequate volume   Culture   Final    NO GROWTH 4 DAYS Performed at [facility], [address], [place], [place] [address]    Report Status PENDING  Incomplete     Time coordinating discharge: Over 30 minutes  SIGNED:   Alba Cory, MD  [facility] 03/29/2017, 11:33 AM Pager   If 7PM-7AM, please contact night-coverage [email or web address] Password [email or web address]

## 2017-03-29 NOTE — Care Management Important Message (Signed)
Important Message  Patient Details  Name: [name] MRN: [id number] Date of Birth: 09/24/1965   Medicare Important Message Given:  Yes    Caren MacadamFuller, Maxine Huynh 03/29/2017, 10:32 AMImportant Message  Patient Details  Name: [name] Parodyamela S Markie MRN: [id number] Date of Birth: 09/24/1965   Medicare Important Message Given:  Yes    Caren MacadamFuller, Caylea Foronda 03/29/2017, 10:32 AM

## 2017-03-29 NOTE — Progress Notes (Signed)
Date:  March 29 2017  Chart reviewed for concurrent status and case management needs.  Will continue to follow patient progress.  Discharge Planning: following for needs  Expected discharge date: [id number]  Rhonda Davis, BSN, RN3, CCM   [phone]  

## 2017-04-06 ENCOUNTER — Emergency Department (HOSPITAL_COMMUNITY): Payer: Medicare Other

## 2017-04-06 ENCOUNTER — Encounter (HOSPITAL_COMMUNITY): Payer: Self-pay

## 2017-04-06 ENCOUNTER — Emergency Department (HOSPITAL_COMMUNITY)
Admission: EM | Admit: 2017-04-06 | Discharge: 2017-04-06 | Disposition: A | Discharge status: Home / Self Care | Payer: Medicare Other | Attending: Emergency Medicine | Admitting: Emergency Medicine

## 2017-04-06 DIAGNOSIS — E1010 Type 1 diabetes mellitus with ketoacidosis without coma: Secondary | ICD-10-CM | POA: Insufficient documentation

## 2017-04-06 DIAGNOSIS — N183 Chronic kidney disease, stage 3 (moderate): Secondary | ICD-10-CM | POA: Insufficient documentation

## 2017-04-06 DIAGNOSIS — E162 Hypoglycemia, unspecified: Secondary | ICD-10-CM | POA: Diagnosis not present

## 2017-04-06 DIAGNOSIS — R2243 Localized swelling, mass and lump, lower limb, bilateral: Secondary | ICD-10-CM | POA: Diagnosis not present

## 2017-04-06 DIAGNOSIS — I129 Hypertensive chronic kidney disease with stage 1 through stage 4 chronic kidney disease, or unspecified chronic kidney disease: Secondary | ICD-10-CM | POA: Insufficient documentation

## 2017-04-06 DIAGNOSIS — Z7982 Long term (current) use of aspirin: Secondary | ICD-10-CM | POA: Diagnosis not present

## 2017-04-06 DIAGNOSIS — Z79899 Other long term (current) drug therapy: Secondary | ICD-10-CM | POA: Insufficient documentation

## 2017-04-06 DIAGNOSIS — Z87891 Personal history of nicotine dependence: Secondary | ICD-10-CM | POA: Insufficient documentation

## 2017-04-06 DIAGNOSIS — M7989 Other specified soft tissue disorders: Secondary | ICD-10-CM

## 2017-04-06 DIAGNOSIS — E039 Hypothyroidism, unspecified: Secondary | ICD-10-CM | POA: Diagnosis not present

## 2017-04-06 LAB — CBC WITH DIFFERENTIAL/PLATELET
BASOS ABS: 0 10*3/uL (ref 0.0–0.1)
BASOS PCT: 0 %
EOS ABS: 0 10*3/uL (ref 0.0–0.7)
Eosinophils Relative: 0 %
HCT: 36.7 % (ref 36.0–46.0)
Hemoglobin: 12.3 g/dL (ref 12.0–15.0)
LYMPHS ABS: 1.3 10*3/uL (ref 0.7–4.0)
Lymphocytes Relative: 25 %
MCH: 29.2 pg (ref 26.0–34.0)
MCHC: 33.5 g/dL (ref 30.0–36.0)
MCV: 87.2 fL (ref 78.0–100.0)
MONO ABS: 0.4 10*3/uL (ref 0.1–1.0)
MONOS PCT: 8 %
NEUTROS ABS: 3.4 10*3/uL (ref 1.7–7.7)
NEUTROS PCT: 67 %
PLATELETS: 284 10*3/uL (ref 150–400)
RBC: 4.21 MIL/uL (ref 3.87–5.11)
RDW: 14.7 % (ref 11.5–15.5)
WBC: 5.2 10*3/uL (ref 4.0–10.5)

## 2017-04-06 LAB — COMPREHENSIVE METABOLIC PANEL
ALBUMIN: 3.3 g/dL — AB (ref 3.5–5.0)
ALT: 15 U/L (ref 14–54)
AST: 16 U/L (ref 15–41)
Alkaline Phosphatase: 119 U/L (ref 38–126)
Anion gap: 9 (ref 5–15)
BUN: 19 mg/dL (ref 6–20)
CHLORIDE: 111 mmol/L (ref 101–111)
CO2: 21 mmol/L — ABNORMAL LOW (ref 22–32)
Calcium: 9.7 mg/dL (ref 8.9–10.3)
Creatinine, Ser: 1.5 mg/dL — ABNORMAL HIGH (ref 0.44–1.00)
GFR calc Af Amer: 46 mL/min — ABNORMAL LOW (ref >60)
GFR, EST NON AFRICAN AMERICAN: 40 mL/min — AB (ref >60)
Glucose, Bld: 31 mg/dL — CL (ref 65–99)
POTASSIUM: 3.9 mmol/L (ref 3.5–5.1)
Sodium: 141 mmol/L (ref 135–145)
Total Bilirubin: 0.6 mg/dL (ref 0.3–1.2)
Total Protein: 7.4 g/dL (ref 6.5–8.1)

## 2017-04-06 LAB — CBG MONITORING, ED
GLUCOSE-CAPILLARY: 110 mg/dL — AB (ref 65–99)
GLUCOSE-CAPILLARY: 76 mg/dL (ref 65–99)
Glucose-Capillary: 21 mg/dL — CL (ref 65–99)
Glucose-Capillary: 103 mg/dL — ABNORMAL HIGH (ref 65–99)
Glucose-Capillary: 81 mg/dL (ref 65–99)

## 2017-04-06 LAB — I-STAT CG4 LACTIC ACID, ED: LACTIC ACID, VENOUS: 0.82 mmol/L (ref 0.5–1.9)

## 2017-04-06 LAB — BRAIN NATRIURETIC PEPTIDE: B Natriuretic Peptide: 137.1 pg/mL — ABNORMAL HIGH (ref 0.0–100.0)

## 2017-04-06 LAB — D-DIMER, QUANTITATIVE: D-Dimer, Quant: 1.15 ug/mL-FEU — ABNORMAL HIGH (ref 0.00–0.50)

## 2017-04-06 MED ORDER — DEXTROSE 50 % IV SOLN
INTRAVENOUS | Status: AC
  Administered 2017-04-06: 50 mL
  Filled 2017-04-06: qty 50

## 2017-04-06 NOTE — ED Notes (Signed)
Patient given 16oz orange juice.

## 2017-04-06 NOTE — Discharge Instructions (Signed)
As discussed, it is important that you follow-up with your endocrinologist for evaluation of your diabetes.   It is also very important to return here tomorrow for ultrasound of your leg to evaluate the swelling.  If you develop symptoms in the interim, return here.

## 2017-04-06 NOTE — ED Notes (Signed)
Patient transported to X-ray 

## 2017-04-06 NOTE — ED Notes (Signed)
ED Provider at bedside. 

## 2017-04-06 NOTE — ED Triage Notes (Signed)
Patient was discharged 2 weeks ago for leg swelling and hypoglycemia. Patient states bilateral legs have worsened in the past 2 days. RLE with increased swelling and redness. LLE has drainage present.

## 2017-04-06 NOTE — ED Provider Notes (Signed)
[facility]-EMERGENCY DEPT Provider Note   CSN: [id number] Arrival date & time: 04/06/17  1524     History   Chief Complaint Chief Complaint  Patient presents with  . Leg Swelling    HPI [name] is a 51 y.o. female.  HPI  Patient with multiple medical issues including chronic kidney disease, status post renal transplant, brittle diabetes, recent hospitalization for cellulitis now presents with concern of bilateral lower extremity swelling, as well as hypoglycemia.  The patient is here with her husband who assists the history of present illness. The patient is awake, but not answering questions quickly on the initial evaluation. Patient notes that over the past 2 days or so she has had increasing swelling and pain inconsistently in both of her lower legs. No report of fever, vomiting. Patient has been taking insulin regularly, 70/30, twice daily. However, patient did not take today's insulin dose 12 hours ago In spite of that the patient has been hypoglycemic throat today, with transient improvement after eating, but with return to critical abnormality each time. [facility]. She is also being evaluated for possible pancreas transplant.    Past Medical History:  Diagnosis Date  . Arthritis    "elbows, knees, legs, back" (09/24/2015)  . CKD (chronic kidney disease)   . Daily headache   . Depression    "years ago"  . End stage renal disease (HCC)    right arm AV graft, post transplant  . Hypertension   . Hypothyroid   . 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 Kidney disease   . Pneumonia ~ 2007?  [name]. Type II diabetes mellitus (HCC)    Insulin dependant    Patient Active Problem List   Diagnosis Date Noted  . Lower extremity cellulitis 03/23/2017  . Type II diabetes mellitus (HCC) 03/23/2017  . Anemia 01/26/2017  . Abnormal LFTs 01/26/2017  . DKA (diabetic ketoacidoses) (HCC) 08/12/2016  .  Diabetic acidosis without coma (HCC)   . DKA, type 2 (HCC) 07/21/2016  . Hypoglycemia   . Wound cellulitis   . CKD (chronic kidney disease) stage 3, GFR 30-59 ml/min 06/26/2016  . Cellulitis 06/26/2016  . Cellulitis of right lower extremity   . Hyperglycemia   . QT prolongation 05/13/2016  . UTI (lower urinary tract infection)   . Hypomagnesemia   . Pressure ulcer 04/11/2016  . Acute encephalopathy 04/11/2016  . Altered mental state 04/11/2016  . Altered mental status 04/10/2016  . Insulin dependent diabetes mellitus (HCC) 04/10/2016  . Diabetic ketoacidosis without coma associated with type 2 diabetes mellitus (HCC) 11/02/2015  . Hyperglycemia without ketosis 09/24/2015  . CKD (chronic kidney disease) stage 2, GFR 60-89 ml/min 09/24/2015  . History of renal transplant 09/24/2015  . Uncontrolled type 1 diabetes mellitus with foot ulcer (HCC) 06/04/2015  . History of renal transplantation 06/04/2015  . Foot ulcer, right (HCC)   . Foot abscess, right   . Diarrhea 06/02/2015  . DM (diabetes mellitus) type 2, uncontrolled, with ketoacidosis (HCC) 06/02/2015  . Type 1 diabetes mellitus with diabetic foot ulcer (HCC) 06/02/2015  . Acute renal failure superimposed on stage 3 chronic kidney disease (HCC) 06/02/2015  . Diabetic foot infection (HCC) 06/01/2015  . Essential hypertension 06/01/2015  . GERD (gastroesophageal reflux disease) 06/01/2015  . AKI (acute kidney injury) (HCC) 01/02/2015  . Hypotension 03/22/2014  . End stage renal disease (HCC) 10/24/2012  . H/O kidney transplant 08/04/2012  .  High anion gap metabolic acidosis 08/04/2012  . Sepsis (HCC) 08/04/2012  . DKA, type 1 (HCC) 09/22/2011  . Hyperkalemia 09/22/2011  . Hyponatremia 09/22/2011  . Immunosuppression (HCC)   . Hypothyroidism     Past Surgical History:  Procedure Laterality Date  . AMPUTATION Left 05/11/2014   Procedure: AMPUTATION LEFT GREAT TOE;  Surgeon: Kathryne Hitchhristopher Y Blackman, MD;  Location: [facility] ORS;   Service: Orthopedics;  Laterality: Left;  . AV FISTULA PLACEMENT Right    forearm  . BACK SURGERY    . CATARACT EXTRACTION W/ INTRAOCULAR LENS  IMPLANT, BILATERAL Bilateral   . CESAREAN SECTION  07/1999  . DG AV DIALYSIS GRAFT DECLOT OR    . INCISION AND DRAINAGE Right 06/02/2015   Procedure: INCISION AND DRAINAGE OF RIGHT 3rd RAY RESECTION;  Surgeon: Kathryne Hitchhristopher Y Blackman, MD;  Location: [facility] OR;  Service: Orthopedics;  Laterality: Right;  . 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 LUMBAR DISC SURGERY  2001  . NEPHRECTOMY TRANSPLANTED ORGAN    . TOE AMPUTATION Right    1,2 & 3rd toes.  . TUBAL LIGATION  07/1999    OB History    No data available       Home Medications    Prior to Admission medications   Medication Sig Start Date End Date Taking? Authorizing Provider  acetaminophen (TYLENOL) 325 MG tablet Take 2 tablets (650 mg total) by mouth every 6 (six) hours as needed for mild pain, moderate pain, fever or headache. Patient not taking: Reported on 03/23/2017 05/15/16   Elease EtienneHongalgi, Anand D, MD  amLODipine (NORVASC) 10 MG tablet Take 1 tablet (10 mg total) by mouth daily. 08/14/16   Regalado, Jon BillingsBelkys A, MD  aspirin EC 81 MG tablet Take 81 mg by mouth daily.    [provider]  carvedilol (COREG) 25 MG tablet Take 50 mg by mouth 2 (two) times daily. 02/26/17   [provider]  cephALEXin (KEFLEX) 500 MG capsule Take 1 capsule (500 mg total) by mouth 4 (four) times daily. 03/29/17   Regalado, Belkys A, MD  doxycycline (VIBRA-TABS) 100 MG tablet Take 1 tablet (100 mg total) by mouth every 12 (twelve) hours. 03/29/17   Regalado, Belkys A, MD  insulin NPH-regular Human (NOVOLIN 70/30) (70-30) 100 UNIT/ML injection Inject 13 Units into the skin 2 (two) times daily with a meal. 03/29/17   Regalado, Belkys A, MD  levothyroxine (SYNTHROID, LEVOTHROID) 150 MCG tablet Take 1 tablet (150 mcg total) by mouth daily before breakfast. 03/30/17   Regalado, Belkys A, MD  mycophenolate  (CELLCEPT) 250 MG capsule Take 500 mg by mouth 2 (two) times daily.     [provider]  nystatin (MYCOSTATIN/NYSTOP) powder Apply topically 4 (four) times daily. 03/29/17   Regalado, Belkys A, MD  predniSONE (DELTASONE) 5 MG tablet Take 5 mg by mouth at bedtime.  05/29/15   [provider]  ranitidine (ZANTAC) 150 MG tablet Take 150 mg by mouth at bedtime.  03/27/16   [provider]  tacrolimus (PROGRAF) 0.5 MG capsule Take 0.5-1 mg by mouth 2 (two) times daily. Take 0.5mg  by mouth in the morning and 1mg  by mouth at bedtime    [provider]  vitamin B-12 (CYANOCOBALAMIN) 100 MCG tablet Take 1 tablet (100 mcg total) by mouth daily. 03/29/17 03/29/18  Regalado, Prentiss BellsBelkys A, MD    Family History Family History  Problem Relation Age of Onset  . Emphysema Mother   . Throat cancer Mother   . COPD  Mother   . Cancer Mother   . Emphysema Father   . COPD Father   . Stroke Father   . ADD / ADHD Son     Social History Social History  Substance Use Topics  . Smoking status: Former Smoker    Packs/day: 1.00    Years: 11.00    Types: Cigarettes    Quit date: 09/21/1998  . Smokeless tobacco: Never Used  . Alcohol use No     Allergies   Ace inhibitors; Adhesive [tape]; and Lisinopril   Review of Systems Review of Systems  Constitutional:       Per HPI, otherwise negative  HENT:       Per HPI, otherwise negative  Respiratory:       Per HPI, otherwise negative  Cardiovascular:       Per HPI, otherwise negative  Gastrointestinal: Positive for nausea. Negative for vomiting.  Endocrine:       Negative aside from HPI  Genitourinary:       Neg aside from HPI   Musculoskeletal:       Per HPI, otherwise negative  Skin: Positive for color change and wound.  Neurological: Positive for weakness. Negative for syncope.     Physical Exam Updated Vital Signs BP (!) 157/79 (BP Location: Left Arm)   Pulse 80   Temp 98.8 F (37.1 C) (Oral)   Resp 18   Ht  5\' 9"  (1.753 m)   Wt 86.4 kg (190 lb 9 oz)   LMP 11/19/2011   SpO2 95%   BMI 28.14 kg/m   Physical Exam  Constitutional: She is oriented to person, place, and time. She has a sickly appearance. No distress.  HENT:  Head: Normocephalic and atraumatic.  Eyes: Conjunctivae and EOM are normal.  Cardiovascular: Normal rate and regular rhythm.   Pulmonary/Chest: Effort normal and breath sounds normal. No stridor. No respiratory distress.  Abdominal: She exhibits no distension.  Musculoskeletal: She exhibits no edema.       Feet:  Neurological: She is alert and oriented to person, place, and time. No cranial nerve deficit.  Skin: Skin is warm and dry.     Psychiatric: She has a normal mood and affect.  Nursing note and vitals reviewed.    ED Treatments / Results  Labs (all labs ordered are listed, but only abnormal results are displayed) Labs Reviewed  COMPREHENSIVE METABOLIC PANEL - Abnormal; Notable for the following:       Result Value   CO2 21 (*)    Glucose, Bld 31 (*)    Creatinine, Ser 1.50 (*)    Albumin 3.3 (*)    GFR calc non Af Amer 40 (*)    GFR calc Af Amer 46 (*)    All other components within normal limits  D-DIMER, QUANTITATIVE (NOT AT [facility]) - Abnormal; Notable for the following:    D-Dimer, Quant 1.15 (*)    All other components within normal limits  CBG MONITORING, ED - Abnormal; Notable for the following:    Glucose-Capillary 21 (*)    All other components within normal limits  CBG MONITORING, ED - Abnormal; Notable for the following:    Glucose-Capillary 110 (*)    All other components within normal limits  CBC WITH DIFFERENTIAL/PLATELET  BRAIN NATRIURETIC PEPTIDE  I-STAT CG4 LACTIC ACID, ED  CBG MONITORING, ED    EKG  EKG Interpretation  Date/Time:  Tuesday April 06 2017 20:38:20 EDT Ventricular Rate:  65 PR Interval:  QRS Duration: 138 QT Interval:  496 QTC Calculation: 516 R Axis:   130 Text Interpretation:  Sinus rhythm RBBB and LPFB  No significant change since last tracing Abnormal ekg Confirmed by Gerhard Munch [phone]) on 04/06/2017 9:12:58 PM       Radiology Dg Chest 2 View  Result Date: 04/06/2017 CLINICAL DATA:  Bilateral lower extremity swelling.  Diabetes. EXAM: CHEST  2 VIEW COMPARISON:  03/23/2017 FINDINGS: There is subsegmental atelectasis in both mid and lower lung zones. Heart is normal in size. No pneumothorax. No pleural effusion. IMPRESSION: Bibasilar subsegmental atelectasis. Electronically Signed   By: Jolaine Click M.D.   On: 04/06/2017 20:08    Procedures Procedures (including critical care time)  Medications Ordered in ED Medications  dextrose 50 % solution (50 mLs  Given 04/06/17 1948)     Initial Impression / Assessment and Plan / ED Course  I have reviewed the triage vital signs and the nursing notes.  Pertinent labs & imaging results that were available during my care of the patient were reviewed by me and considered in my medical decision making (see chart for details).  Initial glucose here 21, and I was called to the bedside. Patient is listless. Patient received glucagon, and will receive one status as tolerated. Given the patient's bilateral lower extremity edema, hypoglycemia, evaluation for infection and/or potential endocrinologic abnormalities ordered.   Initial labs notable for elevated d-dimer given the patient's asymmetric lower extremity swelling, she will need ultrasound. This study is not currently available.  After the patient received her initial dextrose 50, and/or juice, the patient had improved glucose, but soon thereafter had a decline again, Subsequently, however, the patient had sustained appropriate glucose levels, with no change in consciousness, and remained so for the remainder of her ED stay. With no fever, no leukocytosis, there is low suspicion for new cellulitis.  11:07 PM Patient awake and alert, we reviewed all findings as far including episodic  hypoglycemia, need to follow-up with endocrinology. We also specifically discussed the need to monitor her condition, and return tomorrow for ultrasound given the positive d-dimer, and her bilateral lower extremity swelling.    Final Clinical Impressions(s) / ED Diagnoses   Final diagnoses:  Hypoglycemia  Swelling of lower extremity     Gerhard Munch, MD 04/06/17 2308

## 2017-04-07 ENCOUNTER — Ambulatory Visit (HOSPITAL_COMMUNITY): Admission: RE | Admit: 2017-04-07 | Payer: Medicare Other | Source: Ambulatory Visit

## 2017-04-12 ENCOUNTER — Inpatient Hospital Stay (HOSPITAL_COMMUNITY)
Admission: EM | Admit: 2017-04-12 | Discharge: 2017-04-14 | DRG: 638 | Disposition: A | Discharge status: Home / Self Care | Payer: Medicare Other | Attending: Internal Medicine | Admitting: Internal Medicine

## 2017-04-12 ENCOUNTER — Encounter (HOSPITAL_COMMUNITY): Payer: Self-pay

## 2017-04-12 DIAGNOSIS — E1122 Type 2 diabetes mellitus with diabetic chronic kidney disease: Secondary | ICD-10-CM | POA: Diagnosis present

## 2017-04-12 DIAGNOSIS — M479 Spondylosis, unspecified: Secondary | ICD-10-CM | POA: Diagnosis present

## 2017-04-12 DIAGNOSIS — E039 Hypothyroidism, unspecified: Secondary | ICD-10-CM | POA: Diagnosis present

## 2017-04-12 DIAGNOSIS — Z7982 Long term (current) use of aspirin: Secondary | ICD-10-CM | POA: Diagnosis not present

## 2017-04-12 DIAGNOSIS — E860 Dehydration: Secondary | ICD-10-CM | POA: Diagnosis present

## 2017-04-12 DIAGNOSIS — M170 Bilateral primary osteoarthritis of knee: Secondary | ICD-10-CM | POA: Diagnosis present

## 2017-04-12 DIAGNOSIS — R51 Headache: Secondary | ICD-10-CM | POA: Diagnosis present

## 2017-04-12 DIAGNOSIS — Z981 Arthrodesis status: Secondary | ICD-10-CM

## 2017-04-12 DIAGNOSIS — N179 Acute kidney failure, unspecified: Secondary | ICD-10-CM | POA: Diagnosis present

## 2017-04-12 DIAGNOSIS — Z7952 Long term (current) use of systemic steroids: Secondary | ICD-10-CM

## 2017-04-12 DIAGNOSIS — I4581 Long QT syndrome: Secondary | ICD-10-CM | POA: Diagnosis present

## 2017-04-12 DIAGNOSIS — Z79899 Other long term (current) drug therapy: Secondary | ICD-10-CM

## 2017-04-12 DIAGNOSIS — R9431 Abnormal electrocardiogram [ECG] [EKG]: Secondary | ICD-10-CM | POA: Diagnosis not present

## 2017-04-12 DIAGNOSIS — D849 Immunodeficiency, unspecified: Secondary | ICD-10-CM | POA: Diagnosis present

## 2017-04-12 DIAGNOSIS — Z794 Long term (current) use of insulin: Secondary | ICD-10-CM | POA: Diagnosis not present

## 2017-04-12 DIAGNOSIS — I10 Essential (primary) hypertension: Secondary | ICD-10-CM | POA: Diagnosis present

## 2017-04-12 DIAGNOSIS — Z87891 Personal history of nicotine dependence: Secondary | ICD-10-CM | POA: Diagnosis not present

## 2017-04-12 DIAGNOSIS — Z87412 Personal history of vulvar dysplasia: Secondary | ICD-10-CM

## 2017-04-12 DIAGNOSIS — E0810 Diabetes mellitus due to underlying condition with ketoacidosis without coma: Secondary | ICD-10-CM | POA: Diagnosis not present

## 2017-04-12 DIAGNOSIS — M19022 Primary osteoarthritis, left elbow: Secondary | ICD-10-CM | POA: Diagnosis present

## 2017-04-12 DIAGNOSIS — I129 Hypertensive chronic kidney disease with stage 1 through stage 4 chronic kidney disease, or unspecified chronic kidney disease: Secondary | ICD-10-CM | POA: Diagnosis present

## 2017-04-12 DIAGNOSIS — R197 Diarrhea, unspecified: Secondary | ICD-10-CM | POA: Diagnosis not present

## 2017-04-12 DIAGNOSIS — Z940 Kidney transplant status: Secondary | ICD-10-CM

## 2017-04-12 DIAGNOSIS — M19021 Primary osteoarthritis, right elbow: Secondary | ICD-10-CM | POA: Diagnosis present

## 2017-04-12 DIAGNOSIS — E1110 Type 2 diabetes mellitus with ketoacidosis without coma: Secondary | ICD-10-CM | POA: Diagnosis not present

## 2017-04-12 DIAGNOSIS — N183 Chronic kidney disease, stage 3 unspecified: Secondary | ICD-10-CM | POA: Diagnosis present

## 2017-04-12 DIAGNOSIS — D899 Disorder involving the immune mechanism, unspecified: Secondary | ICD-10-CM | POA: Diagnosis not present

## 2017-04-12 DIAGNOSIS — E038 Other specified hypothyroidism: Secondary | ICD-10-CM | POA: Diagnosis not present

## 2017-04-12 DIAGNOSIS — E1010 Type 1 diabetes mellitus with ketoacidosis without coma: Secondary | ICD-10-CM

## 2017-04-12 LAB — BASIC METABOLIC PANEL
ANION GAP: 20 — AB (ref 5–15)
BUN: 40 mg/dL — AB (ref 6–20)
CALCIUM: 9.6 mg/dL (ref 8.9–10.3)
CO2: 14 mmol/L — ABNORMAL LOW (ref 22–32)
Chloride: 95 mmol/L — ABNORMAL LOW (ref 101–111)
Creatinine, Ser: 1.92 mg/dL — ABNORMAL HIGH (ref 0.44–1.00)
GFR calc Af Amer: 34 mL/min — ABNORMAL LOW (ref >60)
GFR, EST NON AFRICAN AMERICAN: 29 mL/min — AB (ref >60)
GLUCOSE: 797 mg/dL — AB (ref 65–99)
POTASSIUM: 5.1 mmol/L (ref 3.5–5.1)
SODIUM: 129 mmol/L — AB (ref 135–145)

## 2017-04-12 LAB — BLOOD GAS, VENOUS
ACID-BASE DEFICIT: 13 mmol/L — AB (ref 0.0–2.0)
BICARBONATE: 13.1 mmol/L — AB (ref 20.0–28.0)
Drawn by: 11249
FIO2: 21
O2 Saturation: 79.8 %
PCO2 VEN: 31.7 mmHg — AB (ref 44.0–60.0)
PH VEN: 7.238 — AB (ref 7.250–7.430)
PO2 VEN: 53.6 mmHg — AB (ref 32.0–45.0)
Patient temperature: 98.6

## 2017-04-12 LAB — CBC
HEMATOCRIT: 35.3 % — AB (ref 36.0–46.0)
Hemoglobin: 11.5 g/dL — ABNORMAL LOW (ref 12.0–15.0)
MCH: 29 pg (ref 26.0–34.0)
MCHC: 32.6 g/dL (ref 30.0–36.0)
MCV: 89.1 fL (ref 78.0–100.0)
Platelets: 182 10*3/uL (ref 150–400)
RBC: 3.96 MIL/uL (ref 3.87–5.11)
RDW: 13.5 % (ref 11.5–15.5)
WBC: 6.2 10*3/uL (ref 4.0–10.5)

## 2017-04-12 LAB — URINALYSIS, ROUTINE W REFLEX MICROSCOPIC
BILIRUBIN URINE: NEGATIVE
Bacteria, UA: NONE SEEN
KETONES UR: 20 mg/dL — AB
LEUKOCYTES UA: NEGATIVE
NITRITE: NEGATIVE
PH: 5 (ref 5.0–8.0)
Protein, ur: 100 mg/dL — AB
SPECIFIC GRAVITY, URINE: 1.018 (ref 1.005–1.030)
Squamous Epithelial / LPF: NONE SEEN

## 2017-04-12 LAB — CBG MONITORING, ED
GLUCOSE-CAPILLARY: 467 mg/dL — AB (ref 65–99)
Glucose-Capillary: >600 mg/dL (ref 65–99)
Glucose-Capillary: 537 mg/dL (ref 65–99)

## 2017-04-12 LAB — I-STAT CG4 LACTIC ACID, ED: Lactic Acid, Venous: 1.46 mmol/L (ref 0.5–1.9)

## 2017-04-12 MED ORDER — SODIUM CHLORIDE 0.9 % IV BOLUS (SEPSIS)
1000.0000 mL | Freq: Once | INTRAVENOUS | Status: AC
  Administered 2017-04-12: 1000 mL via INTRAVENOUS

## 2017-04-12 MED ORDER — SODIUM CHLORIDE 0.9 % IV SOLN
INTRAVENOUS | Status: DC
  Administered 2017-04-12: via INTRAVENOUS

## 2017-04-12 MED ORDER — SODIUM CHLORIDE 0.9 % IV SOLN
INTRAVENOUS | Status: DC
  Administered 2017-04-12: 4.8 [IU]/h via INTRAVENOUS
  Filled 2017-04-12: qty 1

## 2017-04-12 MED ORDER — INSULIN ASPART 100 UNIT/ML ~~LOC~~ SOLN
10.0000 [IU] | Freq: Once | SUBCUTANEOUS | Status: AC
  Administered 2017-04-12: 10 [IU] via INTRAVENOUS
  Filled 2017-04-12: qty 1

## 2017-04-12 MED ORDER — DEXTROSE-NACL 5-0.45 % IV SOLN: INTRAVENOUS | Status: DC

## 2017-04-12 NOTE — Progress Notes (Signed)
Called to ED to get report, I was advised RN would call me back for report.  I can be reached directly at [phone].  Jewel BaizeHans C Cesar Rogerson,RN,BSN,CCRN [facility] Care Coordinator / Rapid Response Nurse

## 2017-04-12 NOTE — ED Notes (Signed)
ED Provider at bedside. 

## 2017-04-12 NOTE — ED Provider Notes (Signed)
[facility]-EMERGENCY DEPT Provider Note   CSN: [id number] Arrival date & time: 04/12/17  1900     History   Chief Complaint Chief Complaint  Patient presents with  . Hyperglycemia    HPI [name] is a 51 y.o. female.  51 year old female with past medical history including IDDM, CKD s/p kidney transplant who p/w hyperglycemia. Patient reports that her blood glucose was running moderately high yesterday and this morning she woke up and her meter read "high." She gave herself 13 units of insulin and this afternoon her meter continued to read high so she gave herself another 13 units. She had one episode of vomiting this morning but denies any nausea currently. She has had some associated headaches. She does report polyuria and polydipsia. She denies any associated diarrhea, cough/cold symptoms, or shortness of breath. No fevers. She reports mild burning with urination. She states she has been compliant with her insulin regimen.   The history is provided by the patient.  Hyperglycemia    Past Medical History:  Diagnosis Date  . Arthritis    "elbows, knees, legs, back" (09/24/2015)  . CKD (chronic kidney disease)   . Daily headache   . Depression    "years ago"  . End stage renal disease (HCC)    right arm AV graft, post transplant  . Hypertension   . Hypothyroid   . 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 Kidney disease   . Pneumonia ~ 2007?  [name]. Type II diabetes mellitus (HCC)    Insulin dependant    Patient Active Problem List   Diagnosis Date Noted  . Lower extremity cellulitis 03/23/2017  . Type II diabetes mellitus (HCC) 03/23/2017  . Anemia 01/26/2017  . Abnormal LFTs 01/26/2017  . DKA (diabetic ketoacidoses) (HCC) 08/12/2016  . Diabetic acidosis without coma (HCC)   . DKA, type 2 (HCC) 07/21/2016  . Hypoglycemia   . Wound cellulitis   . CKD (chronic kidney disease) stage 3, GFR 30-59 ml/min 06/26/2016  . Cellulitis 06/26/2016  .  Cellulitis of right lower extremity   . Hyperglycemia   . QT prolongation 05/13/2016  . UTI (lower urinary tract infection)   . Hypomagnesemia   . Pressure ulcer 04/11/2016  . Acute encephalopathy 04/11/2016  . Altered mental state 04/11/2016  . Altered mental status 04/10/2016  . Insulin dependent diabetes mellitus (HCC) 04/10/2016  . Diabetic ketoacidosis without coma associated with type 2 diabetes mellitus (HCC) 11/02/2015  . Hyperglycemia without ketosis 09/24/2015  . CKD (chronic kidney disease) stage 2, GFR 60-89 ml/min 09/24/2015  . History of renal transplant 09/24/2015  . Uncontrolled type 1 diabetes mellitus with foot ulcer (HCC) 06/04/2015  . History of renal transplantation 06/04/2015  . Foot ulcer, right (HCC)   . Foot abscess, right   . Diarrhea 06/02/2015  . DM (diabetes mellitus) type 2, uncontrolled, with ketoacidosis (HCC) 06/02/2015  . Type 1 diabetes mellitus with diabetic foot ulcer (HCC) 06/02/2015  . Acute renal failure superimposed on stage 3 chronic kidney disease (HCC) 06/02/2015  . Diabetic foot infection (HCC) 06/01/2015  . Essential hypertension 06/01/2015  . GERD (gastroesophageal reflux disease) 06/01/2015  . AKI (acute kidney injury) (HCC) 01/02/2015  . Hypotension 03/22/2014  . End stage renal disease (HCC) 10/24/2012  . H/O kidney transplant 08/04/2012  . High anion gap metabolic acidosis 08/04/2012  . Sepsis (HCC) 08/04/2012  . DKA, type 1 (HCC) 09/22/2011  . Hyperkalemia 09/22/2011  . Hyponatremia 09/22/2011  . Immunosuppression (HCC)   .  Hypothyroidism     Past Surgical History:  Procedure Laterality Date  . AMPUTATION Left 05/11/2014   Procedure: AMPUTATION LEFT GREAT TOE;  Surgeon: Kathryne Hitch, MD;  Location: [facility];  Service: Orthopedics;  Laterality: Left;  . AV FISTULA PLACEMENT Right    forearm  . BACK SURGERY    . CATARACT EXTRACTION W/ INTRAOCULAR LENS  IMPLANT, BILATERAL Bilateral   . CESAREAN SECTION  07/1999  .  DG AV DIALYSIS GRAFT DECLOT OR    . INCISION AND DRAINAGE Right 06/02/2015   Procedure: INCISION AND DRAINAGE OF RIGHT 3rd RAY RESECTION;  Surgeon: Kathryne Hitch, MD;  Location: [facility];  Service: Orthopedics;  Laterality: Right;  . 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 LUMBAR DISC SURGERY  2001  . NEPHRECTOMY TRANSPLANTED ORGAN    . TOE AMPUTATION Right    1,2 & 3rd toes.  . TUBAL LIGATION  07/1999    OB History    No data available       Home Medications    Prior to Admission medications   Medication Sig Start Date End Date Taking? Authorizing Provider  acetaminophen (TYLENOL) 325 MG tablet Take 2 tablets (650 mg total) by mouth every 6 (six) hours as needed for mild pain, moderate pain, fever or headache. 05/15/16  Yes Hongalgi, Maximino Greenland, MD  amLODipine (NORVASC) 10 MG tablet Take 1 tablet (10 mg total) by mouth daily. 08/14/16  Yes Regalado, Belkys A, MD  aspirin EC 81 MG tablet Take 81 mg by mouth daily.   Yes [provider]  carvedilol (COREG) 25 MG tablet Take 50 mg by mouth 2 (two) times daily. 02/26/17  Yes [provider]  insulin NPH-regular Human (NOVOLIN 70/30) (70-30) 100 UNIT/ML injection Inject 13 Units into the skin 2 (two) times daily with a meal. 03/29/17  Yes Regalado, Belkys A, MD  mycophenolate (CELLCEPT) 250 MG capsule Take 500 mg by mouth 2 (two) times daily.    Yes [provider]  nystatin (MYCOSTATIN/NYSTOP) powder Apply topically 4 (four) times daily. 03/29/17  Yes Regalado, Belkys A, MD  predniSONE (DELTASONE) 5 MG tablet Take 5 mg by mouth at bedtime.  05/29/15  Yes [provider]  SYNTHROID 137 MCG tablet Take 137 mcg by mouth daily. 04/10/17  Yes [provider]  tacrolimus (PROGRAF) 0.5 MG capsule Take 0.5-1 mg by mouth 2 (two) times daily. Take 0.5mg  by mouth in the morning and 1mg  by mouth at bedtime   Yes [provider]  vitamin B-12 (CYANOCOBALAMIN) 100 MCG tablet Take 1 tablet (100 mcg total)  by mouth daily. 03/29/17 03/29/18 Yes Regalado, Belkys A, MD  cephALEXin (KEFLEX) 500 MG capsule Take 1 capsule (500 mg total) by mouth 4 (four) times daily. Patient not taking: Reported on 04/12/2017 03/29/17   Regalado, Jon Billings A, MD  doxycycline (VIBRA-TABS) 100 MG tablet Take 1 tablet (100 mg total) by mouth every 12 (twelve) hours. Patient not taking: Reported on 04/12/2017 03/29/17   Hartley Barefoot A, MD  levothyroxine (SYNTHROID, LEVOTHROID) 150 MCG tablet Take 1 tablet (150 mcg total) by mouth daily before breakfast. Patient not taking: Reported on 04/12/2017 03/30/17   Alba Cory, MD    Family History Family History  Problem Relation Age of Onset  . Emphysema Mother   . Throat cancer Mother   . COPD Mother   . Cancer Mother   . Emphysema Father   . COPD Father   . Stroke Father   .  ADD / ADHD Son     Social History Social History  Substance Use Topics  . Smoking status: Former Smoker    Packs/day: 1.00    Years: 11.00    Types: Cigarettes    Quit date: 09/21/1998  . Smokeless tobacco: Never Used  . Alcohol use No     Allergies   Ace inhibitors; Adhesive [tape]; and Lisinopril   Review of Systems Review of Systems All other systems reviewed and are negative except that which was mentioned in HPI   Physical Exam Updated Vital Signs BP (!) 131/52 (BP Location: Right Arm)   Pulse 85   Temp 98 F (36.7 C) (Oral)   Resp 17   Ht 5\' 8"  (1.727 m)   Wt 75.3 kg (166 lb)   LMP 11/19/2011   SpO2 95%   BMI 25.24 kg/m   Physical Exam  Constitutional: She is oriented to person, place, and time. She appears well-developed and well-nourished. No distress.  HENT:  Head: Normocephalic and atraumatic.  Very dry mouth and lips  Eyes: Pupils are equal, round, and reactive to light. Conjunctivae are normal.  Neck: Neck supple.  Cardiovascular: Normal rate, regular rhythm and normal heart sounds.   No murmur heard. Pulmonary/Chest: Effort normal and breath sounds  normal.  Abdominal: Soft. Bowel sounds are normal. She exhibits no distension. There is no tenderness.  Musculoskeletal: She exhibits edema (mild BLE).  Neurological: She is alert and oriented to person, place, and time.  Fluent speech  Skin: Skin is warm and dry.  Skin darkening b/l shins  Psychiatric: She has a normal mood and affect. Judgment normal.  Nursing note and vitals reviewed.    ED Treatments / Results  Labs (all labs ordered are listed, but only abnormal results are displayed) Labs Reviewed  BASIC METABOLIC PANEL - Abnormal; Notable for the following:       Result Value   Sodium 129 (*)    Chloride 95 (*)    CO2 14 (*)    Glucose, Bld 797 (*)    BUN 40 (*)    Creatinine, Ser 1.92 (*)    GFR calc non Af Amer 29 (*)    GFR calc Af Amer 34 (*)    Anion gap 20 (*)    All other components within normal limits  CBC - Abnormal; Notable for the following:    Hemoglobin 11.5 (*)    HCT 35.3 (*)    All other components within normal limits  URINALYSIS, ROUTINE W REFLEX MICROSCOPIC - Abnormal; Notable for the following:    Color, Urine STRAW (*)    Glucose, UA >=500 (*)    Hgb urine dipstick SMALL (*)    Ketones, ur 20 (*)    Protein, ur 100 (*)    All other components within normal limits  BLOOD GAS, VENOUS - Abnormal; Notable for the following:    pH, Ven 7.238 (*)    pCO2, Ven 31.7 (*)    pO2, Ven 53.6 (*)    Bicarbonate 13.1 (*)    Acid-base deficit 13.0 (*)    All other components within normal limits  CBG MONITORING, ED - Abnormal; Notable for the following:    Glucose-Capillary >600 (*)    All other components within normal limits  TACROLIMUS LEVEL  I-STAT CG4 LACTIC ACID, ED    EKG  EKG Interpretation None       Radiology No results found.  Procedures .Critical Care Performed by: Laurence Spates Authorized by:  Kwanza Cancelliere MORGAN   Critical care provider statement:    Critical care time (minutes):  30   Critical care time was  exclusive of:  Separately billable procedures and treating other patients   Critical care was necessary to treat or prevent imminent or life-threatening deterioration of the following conditions:  Endocrine crisis   Critical care was time spent personally by me on the following activities:  Development of treatment plan with patient or surrogate, examination of patient, obtaining history from patient or surrogate, ordering and performing treatments and interventions, ordering and review of laboratory studies, re-evaluation of patient's condition and review of old charts   (including critical care time)  Medications Ordered in ED Medications  insulin regular (NOVOLIN R,HUMULIN R) 100 Units in sodium chloride 0.9 % 100 mL (1 Units/mL) infusion (not administered)  sodium chloride 0.9 % bolus 1,000 mL (0 mLs Intravenous Stopped 04/12/17 2217)    And  sodium chloride 0.9 % bolus 1,000 mL (1,000 mLs Intravenous New Bag/Given 04/12/17 2208)    And  0.9 %  sodium chloride infusion (not administered)  dextrose 5 %-0.45 % sodium chloride infusion (not administered)  insulin aspart (novoLOG) injection 10 Units (10 Units Intravenous Given 04/12/17 2116)     Initial Impression / Assessment and Plan / ED Course  I have reviewed the triage vital signs and the nursing notes.  Pertinent labs that were available during my care of the patient were reviewed by me and considered in my medical decision making (see chart for details).    Pt w/ h/o poorly controlled IDDM p/w hyperglycemia despite taking insulin today. On exam, she was awake and alert, stable vital signs. Very dry mucous membranes. Her labs show blood glucose 797, potassium 5.1, creatinine 1.92, anion gap 20. Normal WBC count. UA with ketones, no evidence of infection. Venous pH is 7.24 with CO2 32 c/w DKA. Gave the patient fluid boluses followed by maintenance fluids and started insulin drip after bolus of insulin. Discussed admission with Dr. Ophelia Charter,  appreciate assistance. Pt admitted for further care. Final Clinical Impressions(s) / ED Diagnoses   Final diagnoses:  Diabetic ketoacidosis without coma associated with type 1 diabetes mellitus [facility])    New Prescriptions New Prescriptions   No medications on file     Damarco Keysor, Ambrose Finland, MD 04/12/17 2236

## 2017-04-12 NOTE — ED Triage Notes (Signed)
Patient BIB EMS with c/o hyperglycemia. Patient reports feeling lethargy, polyuria, and polydipsia. [facility] for my sugars, but they keep going really high or really low." Patient reports [facility] [place] [facility] an endocrinologist. Patient reports taking 13uints SQ of Humulin 70/30 at 0630, then took 13u SQ of Humulin 70/30 at 1630. Patient reports, when her glucometer continued to say "high" despite the insulin, she called the ambulance.

## 2017-04-12 NOTE — H&P (Signed)
History and Physical    [name] WJX:[id number] DOB: 10/15/1965 DOA: 04/12/2017  PCP: Dois Davenportichter, Karen L, MD previously; now she doesn't have one Consultants:  Scheduled to [facility] next month - hoping to get an insulin pump as a bridge to pancreatic transplant Patient coming from: Home - lives with husband and 50yo son; NOK: husband, [phone]  Chief Complaint:  hyperglycemia  HPI: [name] is a 51 y.o. female with medical history significant of osteoarthritis, chronic kidney disease, headaches, depression, hypertension, hypothyroidism, kidney transplant, immunosuppression due to transplant medications, history of pneumonia, insulin requiring type 2 diabetes with frequent admissions for poor glycemic control presenting with hyperglycemia.  She was admitted from 7/17-23 after presenting with glucose in the 30s.  She had been seen and treated in the ER the day prior for cellulitis and given PO antibiotics.  Her glucose normalized and she was discharged on her same insulin (70/30, 13 units BID).  She was then seen again on 7/31 for leg swelling and again her glucose was 21; she improved and was discharged from the ER. This AM, her sugar started "getting low" after she took 13 units 70/30; it got to 300-something.  This afternoon, it was "going high" (520) and so she took another 13 units 70/30.  She checked it later after a nap and the machine read "high".  She has been somnolent all day.  +polyuria/polydipsia.  +headache.  Mild nasal congestion.  Leg edema has improved.  Slight diarrhea (3-4 somewhat loose stools today) and some nausea.  She has been on antibiotics recently for her legs (Keflex and Doxycycline).   ED Course: DKA, no evidence of infection. Fluid boluses and MIVF.  Started insulin drip.  Review of Systems: As per HPI; otherwise review of systems reviewed and negative.   Ambulatory Status:  Ambulates without assistance but she does require a shower  chair  Past Medical History:  Diagnosis Date  . Arthritis    "elbows, knees, legs, back" (09/24/2015)  . CKD (chronic kidney disease)   . Daily headache   . Depression    "years ago"  . End stage renal disease (HCC)    right arm AV graft, resolved post transplant  . Hypertension   . Hypothyroid   . 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 Kidney disease 2011  . Pneumonia ~ 2007?  [name]. Type II diabetes mellitus (HCC)    Insulin dependant    Past Surgical History:  Procedure Laterality Date  . AMPUTATION Left 05/11/2014   Procedure: AMPUTATION LEFT GREAT TOE;  Surgeon: Kathryne Hitchhristopher Y Blackman, MD;  Location: [facility];  Service: Orthopedics;  Laterality: Left;  . AV FISTULA PLACEMENT Right    forearm  . BACK SURGERY    . CATARACT EXTRACTION W/ INTRAOCULAR LENS  IMPLANT, BILATERAL Bilateral   . CESAREAN SECTION  07/1999  . DG AV DIALYSIS GRAFT DECLOT OR    . INCISION AND DRAINAGE Right 06/02/2015   Procedure: INCISION AND DRAINAGE OF RIGHT 3rd RAY RESECTION;  Surgeon: Kathryne Hitchhristopher Y Blackman, MD;  Location: [facility];  Service: Orthopedics;  Laterality: Right;  . 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 LUMBAR DISC SURGERY  2001  . NEPHRECTOMY TRANSPLANTED ORGAN    . TOE AMPUTATION Right    1,2 & 3rd toes.  . TUBAL LIGATION  07/1999    Social History   Social History  . Marital status: Married    Spouse name: N/A  . Number  of children: N/A  . Years of education: N/A   Occupational History  . disabled    Social History Main Topics  . Smoking status: Former Smoker    Packs/day: 1.00    Years: 11.00    Types: Cigarettes    Quit date: 09/21/1998  . Smokeless tobacco: Never Used  . Alcohol use No  . Drug use: No  . Sexual activity: No   Other Topics Concern  . Not on file   Social History Narrative  . No narrative on file    Allergies  Allergen Reactions  . Ace Inhibitors Cough  . Adhesive [Tape] Itching and Other (See Comments)    Please use  paper tape  . Lisinopril Cough    Family History  Problem Relation Age of Onset  . Emphysema Mother   . Throat cancer Mother   . COPD Mother   . Cancer Mother   . Emphysema Father   . COPD Father   . Stroke Father   . ADD / ADHD Son     Prior to Admission medications   Medication Sig Start Date End Date Taking? Authorizing Provider  acetaminophen (TYLENOL) 325 MG tablet Take 2 tablets (650 mg total) by mouth every 6 (six) hours as needed for mild pain, moderate pain, fever or headache. 05/15/16  Yes Hongalgi, Maximino Greenland, MD  amLODipine (NORVASC) 10 MG tablet Take 1 tablet (10 mg total) by mouth daily. 08/14/16  Yes Regalado, Belkys A, MD  aspirin EC 81 MG tablet Take 81 mg by mouth daily.   Yes [provider]  carvedilol (COREG) 25 MG tablet Take 50 mg by mouth 2 (two) times daily. 02/26/17  Yes [provider]  insulin NPH-regular Human (NOVOLIN 70/30) (70-30) 100 UNIT/ML injection Inject 13 Units into the skin 2 (two) times daily with a meal. 03/29/17  Yes Regalado, Belkys A, MD  mycophenolate (CELLCEPT) 250 MG capsule Take 500 mg by mouth 2 (two) times daily.    Yes [provider]  nystatin (MYCOSTATIN/NYSTOP) powder Apply topically 4 (four) times daily. 03/29/17  Yes Regalado, Belkys A, MD  predniSONE (DELTASONE) 5 MG tablet Take 5 mg by mouth at bedtime.  05/29/15  Yes [provider]  SYNTHROID 137 MCG tablet Take 137 mcg by mouth daily. 04/10/17  Yes [provider]  tacrolimus (PROGRAF) 0.5 MG capsule Take 0.5-1 mg by mouth 2 (two) times daily. Take 0.5mg  by mouth in the morning and 1mg  by mouth at bedtime   Yes [provider]  vitamin B-12 (CYANOCOBALAMIN) 100 MCG tablet Take 1 tablet (100 mcg total) by mouth daily. 03/29/17 03/29/18 Yes Regalado, Belkys A, MD  cephALEXin (KEFLEX) 500 MG capsule Take 1 capsule (500 mg total) by mouth 4 (four) times daily. Patient not taking: Reported on 04/12/2017 03/29/17   Regalado, Jon Billings A, MD   doxycycline (VIBRA-TABS) 100 MG tablet Take 1 tablet (100 mg total) by mouth every 12 (twelve) hours. Patient not taking: Reported on 04/12/2017 03/29/17   Hartley Barefoot A, MD  levothyroxine (SYNTHROID, LEVOTHROID) 150 MCG tablet Take 1 tablet (150 mcg total) by mouth daily before breakfast. Patient not taking: Reported on 04/12/2017 03/30/17   Alba Cory, MD    Physical Exam: Vitals:   04/12/17 2300 04/12/17 2302 04/12/17 2315 04/12/17 2330  BP: (!) 152/62 (!) 152/62  (!) 155/66  Pulse: 86 86 86 85  Resp:  14    Temp:      TempSrc:  SpO2: 94% 95%  (!) 85%  Weight:      Height:         General:  Appears calm and comfortable and is NAD Eyes:  PERRL, EOMI, normal lids, iris ENT:  grossly normal hearing, lips & tongue, mmm Neck:  no LAD, masses or thyromegaly Cardiovascular:  RRR, no m/r/g. No LE edema.  Respiratory:  CTA bilaterally, no w/r/r. Normal respiratory effort. Abdomen:  soft, ntnd, NABS Skin:  Stasis dermatitis with marked stasis of the B LE Musculoskeletal:  grossly normal tone BUE/BLE, good ROM, no bony abnormality; she is s/p toe amputations on the right Psychiatric:  grossly normal mood and affect, speech fluent and appropriate, AOx3 Neurologic:  CN 2-12 grossly intact, moves all extremities in coordinated fashion, sensation intact  Labs on Admission: I have personally reviewed following labs and imaging studies  CBC:  Recent Labs Lab 04/06/17 1941 04/12/17 2015  WBC 5.2 6.2  NEUTROABS 3.4  --   HGB 12.3 11.5*  HCT 36.7 35.3*  MCV 87.2 89.1  PLT 284 182   Basic Metabolic Panel:  Recent Labs Lab 04/06/17 1941 04/12/17 2015  NA 141 129*  K 3.9 5.1  CL 111 95*  CO2 21* 14*  GLUCOSE 31* 797*  BUN 19 40*  CREATININE 1.50* 1.92*  CALCIUM 9.7 9.6   GFR: Estimated Creatinine Clearance: 35.4 mL/min (A) (by C-G formula based on SCr of 1.92 mg/dL (H)). Liver Function Tests:  Recent Labs Lab 04/06/17 1941  AST 16  ALT 15  ALKPHOS 119   BILITOT 0.6  PROT 7.4  ALBUMIN 3.3*   No results for input(s): LIPASE, AMYLASE in the last 168 hours. No results for input(s): AMMONIA in the last 168 hours. Coagulation Profile: No results for input(s): INR, PROTIME in the last 168 hours. Cardiac Enzymes: No results for input(s): CKTOTAL, CKMB, CKMBINDEX, TROPONINI in the last 168 hours. BNP (last 3 results) No results for input(s): PROBNP in the last 8760 hours. HbA1C: No results for input(s): HGBA1C in the last 72 hours. CBG:  Recent Labs Lab 04/06/17 2049 04/06/17 2133 04/06/17 2222 04/12/17 1924 04/12/17 2239  GLUCAP 76 81 103* >600* 537*   Lipid Profile: No results for input(s): CHOL, HDL, LDLCALC, TRIG, CHOLHDL, LDLDIRECT in the last 72 hours. Thyroid Function Tests: No results for input(s): TSH, T4TOTAL, FREET4, T3FREE, THYROIDAB in the last 72 hours. Anemia Panel: No results for input(s): VITAMINB12, FOLATE, FERRITIN, TIBC, IRON, RETICCTPCT in the last 72 hours. Urine analysis:    Component Value Date/Time   COLORURINE STRAW (A) 04/12/2017 1926   APPEARANCEUR CLEAR 04/12/2017 1926   LABSPEC 1.018 04/12/2017 1926   PHURINE 5.0 04/12/2017 1926   GLUCOSEU >=500 (A) 04/12/2017 1926   HGBUR SMALL (A) 04/12/2017 1926   BILIRUBINUR NEGATIVE 04/12/2017 1926   KETONESUR 20 (A) 04/12/2017 1926   PROTEINUR 100 (A) 04/12/2017 1926   UROBILINOGEN 0.2 03/02/2015 1153   NITRITE NEGATIVE 04/12/2017 1926   LEUKOCYTESUR NEGATIVE 04/12/2017 1926    Creatinine Clearance: Estimated Creatinine Clearance: 35.4 mL/min (A) (by C-G formula based on SCr of 1.92 mg/dL (H)).  Sepsis Labs: @LABRCNTIP (procalcitonin:4,lacticidven:4) )No results found for this or any previous visit (from the past 240 hour(s)).   Radiological Exams on Admission: No results found.  EKG: not done  Assessment/Plan Principal Problem:   DKA (diabetic ketoacidoses) (HCC) Active Problems:   Immunosuppression (HCC)   Hypothyroidism   H/O kidney  transplant   Essential hypertension   Diarrhea   Acute renal failure superimposed  on stage 3 chronic kidney disease (HCC)   QT prolongation    Pertinent labs: VBG: 7.238/32/54/13 Glucose 797, 537 Na++ 129 (corrects to 140) CO2 14 BUN 40/Creatinine 1.92/GFR 29; 19/1.5/40 on 7/31 UA: >500 glucose, small Hgb, 20 ketones, 100 protein   DKA -Patient with poor baseline control (see below) -Previously admitted and then subsequently seen in the ER with hypoglycemia -Now presenting in DKA with CO2 14, anion gap 20 -No indication of illness as source -Moderate DKA on admission based on pH 7.238, HCO3 14, patient alert  -Will admit to SDU with DKA protocol -Would recommend continuing insulin drip at least until morning regardless of rapidity of closure of gap and normalization of labs -K+ slightly increased at time of presentation but would expect to need potassium supplementation added with next BMP -IVF at 150 cc/hr, NS until glucose <250 and then decrease rate to 125 and change to D51/2NS -Her last A1c was 10.4 on 5/23, indicating poor baseline control -She has been referred to [facility] for possible insulin pump and/or consideration of pancreatic transplant -For now, her 70/30 insulin is likely the culprit - this medication, while more cost-effective, is extremely challenging to use in brittle diabetics since you cannot control the basal and bolus doses separately -Would strongly suggest transitioning the patient to Lantus or Levemir if this can be accomplished from a financial standpoint -Will request case manager assistance with this issue  Acute renal failure on stage 3 CKD -Resulting from dehydration in the setting of DKA -Will rehydrate and follow -These large swings from hyper- to hypoglycemia are taxing her transplanted kidney and likely to lead to ongoing damage  Diarrhea -Recent antibiotics so she is at risk for C diff -Will test  -Enteric precautions for now  QT  prolongation -May be related to DKA but she also has a h/o similar -Attempt to avoid QT-prolonging medications where possible  Hypothyroidism -Thyroid testing indicated equivocal results on 7/22 testing -Continue levothyroxine 137 g by mouth daily. -Recheck 4-6 weeks after 7/22   Essential hypertension -Continue amlodipine 10 mg by mouth daily. -Continue carvedilol 12.5 mg by mouth twice a day. -Monitor blood pressure.  H/O kidney transplant with immunosuppression -Continue mycophenolate 500 mg by mouth twice a day. -Continue prednisone 5 mg by mouth at bedtime. -Continue tacrolimus 0.5-1 mg by mouth twice a day. -Check tacrolimus level   DVT prophylaxis: Lovenox  Code Status:  Full - confirmed with patient Family Communication: None present  Disposition Plan:  Home once clinically improved Consults called: None  Admission status: Admit - It is my clinical opinion that admission to INPATIENT is reasonable and necessary because this patient will require at least 2 midnights in the hospital to treat this condition based on the medical complexity of the problems presented.  Given the aforementioned information, the predictability of an adverse outcome is felt to be significant.    Jonah Blue MD [facility]  If 7PM-7AM, please contact night-coverage [email or web address] Password [email or web address]  04/12/2017, 11:42 PM

## 2017-04-12 NOTE — ED Triage Notes (Signed)
Per EMS, pt from home.  Pt c/o hyperglycemia.  Pt checked cbg at home.  Pt has headache.  Emesis x 1.  Pt has had long term of same b/c needs med adjustment.  Pt has taken insulin today.  Vitals:  cbg high, 120/82, hr 84, resp 16, 96%

## 2017-04-12 NOTE — ED Notes (Signed)
Hospitalist at bedside 

## 2017-04-13 DIAGNOSIS — R197 Diarrhea, unspecified: Secondary | ICD-10-CM

## 2017-04-13 DIAGNOSIS — N179 Acute kidney failure, unspecified: Secondary | ICD-10-CM

## 2017-04-13 DIAGNOSIS — I10 Essential (primary) hypertension: Secondary | ICD-10-CM

## 2017-04-13 DIAGNOSIS — E0810 Diabetes mellitus due to underlying condition with ketoacidosis without coma: Secondary | ICD-10-CM

## 2017-04-13 DIAGNOSIS — D899 Disorder involving the immune mechanism, unspecified: Secondary | ICD-10-CM

## 2017-04-13 DIAGNOSIS — N183 Chronic kidney disease, stage 3 (moderate): Secondary | ICD-10-CM

## 2017-04-13 DIAGNOSIS — E038 Other specified hypothyroidism: Secondary | ICD-10-CM

## 2017-04-13 DIAGNOSIS — Z940 Kidney transplant status: Secondary | ICD-10-CM

## 2017-04-13 LAB — BASIC METABOLIC PANEL
ANION GAP: 13 (ref 5–15)
ANION GAP: 8 (ref 5–15)
Anion gap: 8 (ref 5–15)
Anion gap: 9 (ref 5–15)
Anion gap: 9 (ref 5–15)
BUN: 31 mg/dL — ABNORMAL HIGH (ref 6–20)
BUN: 32 mg/dL — AB (ref 6–20)
BUN: 34 mg/dL — AB (ref 6–20)
BUN: 37 mg/dL — AB (ref 6–20)
BUN: 39 mg/dL — AB (ref 6–20)
CALCIUM: 9 mg/dL (ref 8.9–10.3)
CO2: 18 mmol/L — ABNORMAL LOW (ref 22–32)
CO2: 19 mmol/L — AB (ref 22–32)
CO2: 21 mmol/L — ABNORMAL LOW (ref 22–32)
CO2: 22 mmol/L (ref 22–32)
CO2: 22 mmol/L (ref 22–32)
CREATININE: 1.35 mg/dL — AB (ref 0.44–1.00)
CREATININE: 1.45 mg/dL — AB (ref 0.44–1.00)
Calcium: 9.3 mg/dL (ref 8.9–10.3)
Calcium: 8.9 mg/dL (ref 8.9–10.3)
Calcium: 9.1 mg/dL (ref 8.9–10.3)
Calcium: 9 mg/dL (ref 8.9–10.3)
Chloride: 102 mmol/L (ref 101–111)
Chloride: 106 mmol/L (ref 101–111)
Chloride: 109 mmol/L (ref 101–111)
Chloride: 105 mmol/L (ref 101–111)
Chloride: 107 mmol/L (ref 101–111)
Creatinine, Ser: 1.85 mg/dL — ABNORMAL HIGH (ref 0.44–1.00)
Creatinine, Ser: 1.45 mg/dL — ABNORMAL HIGH (ref 0.44–1.00)
Creatinine, Ser: 1.59 mg/dL — ABNORMAL HIGH (ref 0.44–1.00)
GFR calc Af Amer: 36 mL/min — ABNORMAL LOW (ref >60)
GFR calc Af Amer: 48 mL/min — ABNORMAL LOW (ref >60)
GFR calc Af Amer: 52 mL/min — ABNORMAL LOW (ref >60)
GFR calc Af Amer: 48 mL/min — ABNORMAL LOW (ref >60)
GFR calc Af Amer: 43 mL/min — ABNORMAL LOW (ref >60)
GFR calc non Af Amer: 37 mL/min — ABNORMAL LOW (ref >60)
GFR, EST NON AFRICAN AMERICAN: 31 mL/min — AB (ref >60)
GFR, EST NON AFRICAN AMERICAN: 41 mL/min — AB (ref >60)
GFR, EST NON AFRICAN AMERICAN: 41 mL/min — AB (ref >60)
GFR, EST NON AFRICAN AMERICAN: 45 mL/min — AB (ref >60)
Glucose, Bld: 438 mg/dL — ABNORMAL HIGH (ref 65–99)
Glucose, Bld: 155 mg/dL — ABNORMAL HIGH (ref 65–99)
Glucose, Bld: 91 mg/dL (ref 65–99)
Glucose, Bld: 150 mg/dL — ABNORMAL HIGH (ref 65–99)
Glucose, Bld: 303 mg/dL — ABNORMAL HIGH (ref 65–99)
POTASSIUM: 3.4 mmol/L — AB (ref 3.5–5.1)
POTASSIUM: 3.7 mmol/L (ref 3.5–5.1)
POTASSIUM: 3.8 mmol/L (ref 3.5–5.1)
POTASSIUM: 3.8 mmol/L (ref 3.5–5.1)
Potassium: 4.3 mmol/L (ref 3.5–5.1)
SODIUM: 133 mmol/L — AB (ref 135–145)
SODIUM: 135 mmol/L (ref 135–145)
SODIUM: 135 mmol/L (ref 135–145)
SODIUM: 136 mmol/L (ref 135–145)
Sodium: 139 mmol/L (ref 135–145)

## 2017-04-13 LAB — GLUCOSE, CAPILLARY
GLUCOSE-CAPILLARY: 321 mg/dL — AB (ref 65–99)
GLUCOSE-CAPILLARY: 248 mg/dL — AB (ref 65–99)
GLUCOSE-CAPILLARY: 196 mg/dL — AB (ref 65–99)
GLUCOSE-CAPILLARY: 113 mg/dL — AB (ref 65–99)
GLUCOSE-CAPILLARY: 80 mg/dL (ref 65–99)
GLUCOSE-CAPILLARY: 102 mg/dL — AB (ref 65–99)
GLUCOSE-CAPILLARY: 285 mg/dL — AB (ref 65–99)
GLUCOSE-CAPILLARY: 205 mg/dL — AB (ref 65–99)
Glucose-Capillary: >600 mg/dL (ref 65–99)
Glucose-Capillary: 145 mg/dL — ABNORMAL HIGH (ref 65–99)
Glucose-Capillary: 92 mg/dL (ref 65–99)
Glucose-Capillary: 135 mg/dL — ABNORMAL HIGH (ref 65–99)

## 2017-04-13 MED ORDER — ACETAMINOPHEN 325 MG PO TABS
650.0000 mg | ORAL_TABLET | Freq: Four times a day (QID) | ORAL | Status: DC | PRN
  Administered 2017-04-13: 650 mg via ORAL
  Filled 2017-04-13: qty 2

## 2017-04-13 MED ORDER — SODIUM CHLORIDE 0.9 % IV SOLN
INTRAVENOUS | Status: DC
  Administered 2017-04-13 – 2017-04-14 (×3): via INTRAVENOUS

## 2017-04-13 MED ORDER — INSULIN ASPART 100 UNIT/ML ~~LOC~~ SOLN
0.0000 [IU] | Freq: Three times a day (TID) | SUBCUTANEOUS | Status: DC
  Administered 2017-04-13: 5 [IU] via SUBCUTANEOUS
  Administered 2017-04-13: 2 [IU] via SUBCUTANEOUS
  Administered 2017-04-14 (×2): 7 [IU] via SUBCUTANEOUS

## 2017-04-13 MED ORDER — INSULIN ASPART 100 UNIT/ML ~~LOC~~ SOLN
0.0000 [IU] | Freq: Every day | SUBCUTANEOUS | Status: DC
  Administered 2017-04-13: 2 [IU] via SUBCUTANEOUS

## 2017-04-13 MED ORDER — CARVEDILOL 25 MG PO TABS
50.0000 mg | ORAL_TABLET | Freq: Two times a day (BID) | ORAL | Status: DC
  Administered 2017-04-13 – 2017-04-14 (×4): 50 mg via ORAL
  Filled 2017-04-13 (×2): qty 4
  Filled 2017-04-13 (×2): qty 2

## 2017-04-13 MED ORDER — INSULIN GLARGINE 100 UNIT/ML ~~LOC~~ SOLN
20.0000 [IU] | Freq: Every day | SUBCUTANEOUS | Status: DC
  Administered 2017-04-13 – 2017-04-14 (×2): 20 [IU] via SUBCUTANEOUS
  Filled 2017-04-13 (×2): qty 0.2

## 2017-04-13 MED ORDER — DEXTROSE-NACL 5-0.45 % IV SOLN
INTRAVENOUS | Status: DC
  Administered 2017-04-13: 03:00:00 via INTRAVENOUS

## 2017-04-13 MED ORDER — MYCOPHENOLATE MOFETIL 250 MG PO CAPS
500.0000 mg | ORAL_CAPSULE | Freq: Two times a day (BID) | ORAL | Status: DC
  Administered 2017-04-13 – 2017-04-14 (×4): 500 mg via ORAL
  Filled 2017-04-13 (×4): qty 2

## 2017-04-13 MED ORDER — TACROLIMUS 0.5 MG PO CAPS
0.5000 mg | ORAL_CAPSULE | Freq: Every day | ORAL | Status: DC
  Administered 2017-04-13 – 2017-04-14 (×2): 0.5 mg via ORAL
  Filled 2017-04-13 (×2): qty 1

## 2017-04-13 MED ORDER — SODIUM CHLORIDE 0.9 % IV SOLN
INTRAVENOUS | Status: AC
  Administered 2017-04-13: 01:00:00 via INTRAVENOUS

## 2017-04-13 MED ORDER — LEVOTHYROXINE SODIUM 25 MCG PO TABS
137.0000 ug | ORAL_TABLET | Freq: Every day | ORAL | Status: DC
  Administered 2017-04-13 – 2017-04-14 (×2): 137 ug via ORAL
  Filled 2017-04-13 (×3): qty 1

## 2017-04-13 MED ORDER — SODIUM CHLORIDE 0.9 % IV SOLN
INTRAVENOUS | Status: DC
  Filled 2017-04-13: qty 1

## 2017-04-13 MED ORDER — PREDNISONE 5 MG PO TABS
5.0000 mg | ORAL_TABLET | Freq: Every day | ORAL | Status: DC
  Administered 2017-04-13 (×2): 5 mg via ORAL
  Filled 2017-04-13 (×2): qty 1

## 2017-04-13 MED ORDER — ENOXAPARIN SODIUM 40 MG/0.4ML ~~LOC~~ SOLN
40.0000 mg | Freq: Every day | SUBCUTANEOUS | Status: DC
  Administered 2017-04-13: 40 mg via SUBCUTANEOUS
  Filled 2017-04-13: qty 0.4

## 2017-04-13 MED ORDER — ASPIRIN EC 81 MG PO TBEC
81.0000 mg | DELAYED_RELEASE_TABLET | Freq: Every day | ORAL | Status: DC
  Administered 2017-04-13 – 2017-04-14 (×2): 81 mg via ORAL
  Filled 2017-04-13 (×2): qty 1

## 2017-04-13 MED ORDER — TACROLIMUS 1 MG PO CAPS
1.0000 mg | ORAL_CAPSULE | Freq: Every day | ORAL | Status: DC
  Administered 2017-04-13 (×2): 1 mg via ORAL
  Filled 2017-04-13 (×2): qty 1

## 2017-04-13 MED ORDER — ENOXAPARIN SODIUM 30 MG/0.3ML ~~LOC~~ SOLN
30.0000 mg | Freq: Every day | SUBCUTANEOUS | Status: DC
  Administered 2017-04-13: 30 mg via SUBCUTANEOUS
  Filled 2017-04-13: qty 0.3

## 2017-04-13 MED ORDER — AMLODIPINE BESYLATE 10 MG PO TABS
10.0000 mg | ORAL_TABLET | Freq: Every day | ORAL | Status: DC
  Administered 2017-04-13 – 2017-04-14 (×2): 10 mg via ORAL
  Filled 2017-04-13 (×2): qty 1

## 2017-04-13 NOTE — Progress Notes (Signed)
Report given to Ekua, RN 

## 2017-04-13 NOTE — Progress Notes (Signed)
Inpatient Diabetes Program Recommendations  AACE/ADA: New Consensus Statement on Inpatient Glycemic Control (2015)  Target Ranges:  Prepandial:   less than 140 mg/dL      Peak postprandial:   less than 180 mg/dL (1-2 hours)      Critically ill patients:  140 - 180 mg/dL   Lab Results  Component Value Date   GLUCAP 102 (H) 04/13/2017   HGBA1C 10.4 (H) 01/27/2017    Review of Glycemic Control  Diabetes history: DM1 Outpatient Diabetes medications: 70/30 13 units bid Current orders for Inpatient glycemic control: Transitioning off IV insulin - Lantus 20 units QD, Novolog 0-9 units Q4H. Pt continues to be NPO.  Spoke with pt regarding her glycemic control at home. States she did not miss her insulin doses. States she had eaten normal meals and didn't understand why her blood sugar had gone so high. Admits to occasionally missing insulin doses. States she has hard time remembering when to take it sometimes. Concerned about taking Lantus. [place] her on 70/30 insulin.  Inpatient Diabetes Program Recommendations:    Begin 70/30 insulin - 20 units bid. Novolog 0-9 units tidwc and hs CHO mod med diet.  Will follow.  Thank you. Ailene Ardshonda Bryauna Byrum, RD, LDN, CDE Inpatient Diabetes Coordinator [phone]

## 2017-04-13 NOTE — Care Management Note (Signed)
Case Management Note  Patient Details  Name: [name] MRN: [id number] Date of Birth: [birth year]  Subjective/Objective:         dka           Action/Plan: Date:  April 13, 2017 Chart reviewed for concurrent status and case management needs. Will continue to follow patient progress. Discharge Planning: following for needs Expected discharge date: [id number] Marcelle SmilingRhonda Davis, BSN, DennardRN3, [place]   [phone]  Expected Discharge Date:                  Expected Discharge Plan:  Home/Self Care  In-House Referral:  Financial Counselor  Discharge planning Services  CM Consult  Post Acute Care Choice:    Choice offered to:     DME Arranged:    DME Agency:     HH Arranged:    HH Agency:     Status of Service:  In process, will continue to follow  If discussed at Long Length of Stay Meetings, dates discussed:    Additional Comments:  Golda AcreDavis, Rhonda Lynn, RN 04/13/2017, 8:32 AM

## 2017-04-13 NOTE — Progress Notes (Signed)
PROGRESS NOTE    [name]  ZOX:[id number] DOB: [birth year] DOA: 04/12/2017 PCP: Dois Davenport, MD   Brief Narrative:   51 year old female with history of osteoarthritis, CK D, headache, depression, hypertension, hypothyroidism, renal transplant on immunosuppression meds, diabetes type 2 came to the ER for evaluation of hyperglycemia. Patient was recently here and was treated for hypoglycemia and cellulitis. Upon admission she was found to be in DKA.  Assessment & Plan:   Principal Problem:   DKA (diabetic ketoacidoses) (HCC) Active Problems:   Immunosuppression (HCC)   Hypothyroidism   H/O kidney transplant   Essential hypertension   Diarrhea   Acute renal failure superimposed on stage 3 chronic kidney disease (HCC)   QT prolongation   Diabetic ketoacidosis, resolved Uncontrolled diabetes type 2-insulin-dependent -Insulin drip has been stopped, transitioning to Lantus 20 units and sliding scale -Check A1c, diabetic coordinator input appreciated -70/30 insulin has caused labile blood sugar leading to episodes of hypoglycemia. Even though Lantus is maybe a little expensive patient is willing to try it as it will be a little easier and closer to human physiology when used with sliding scale. I will consult case manager to assist in determining the cost and if patient will be agreeable to it. If not we will have to adjust 70/30 insulin. Patient is well educated on how to use these drugs but she states she forgets frequently and her oral intake is inconsistent. -Supportive care, transfer to MedSurg floor -Follow-up outpatient with endocrinology-has an appointment at [facility] at the end of this month.  Diarrhea, improved -Check stool studies if possible. Improved  AK I on CKG stage III, improved -Was likely prerenal in nature. This is improved after oral hydration. -Avoid nephrotoxic drugs and trend creatinine  Hypothyroidism -Continue Synthroid. Will need to recheck her thyroid  levels around last week of August oh first week of September  History of hypertension -Continue amlodipine 10 mg daily and Coreg 12.5 mg twice a day.  History of renal transplant -Continue CellCept, prednisone and tacrolimus.  DVT prophylaxis: Lovenox Code Status: Full code Family Communication:  Patient comprehends well. Disposition Plan: To be determined. Hopefully can discharge in next 24-48 hours  Consultants:   Diabetic coordinator  Procedures:   None  Antimicrobials:   None   Subjective: Patient admitted yesterday for DKA. Patient states she has been taken her 70/30 insulin but has been causing very labile blood sugars. She has an appointment with endocrinologist at the end of this month. Denies any other complaints this morning. She does admit of understanding how to carbohydrate count and administer her insulin. No other complaints. States her diarrhea has improved  Objective: Vitals:   04/13/17 0400 04/13/17 0500 04/13/17 0717 04/13/17 0755  BP: (!) 145/68   133/63  Pulse: 78 74    Resp: (!) 23 16    Temp:   98.3 F (36.8 C)   TempSrc:   Oral   SpO2: 98% 97%    Weight:      Height:        Intake/Output Summary (Last 24 hours) at 04/13/17 1051 Last data filed at 04/12/17 2333  Gross per 24 hour  Intake             1000 ml  Output                0 ml  Net             1000 ml   [employer]   04/12/17  1925 04/12/17 1947 04/13/17 0034  Weight: 86.2 kg (190 lb) 75.3 kg (166 lb) 82.2 kg (181 lb 3.5 oz)    Examination:  General exam: Appears calm and comfortable  Respiratory system: Clear to auscultation. Respiratory effort normal. Cardiovascular system: S1 & S2 heard, RRR. No JVD, murmurs, rubs, gallops or clicks. No pedal edema. Gastrointestinal system: Abdomen is nondistended, soft and nontender. No organomegaly or masses felt. Normal bowel sounds heard. Central nervous system: Alert and oriented. No focal neurological deficits. Extremities:  Symmetric 5 x 5 power. Skin: No rashes, lesions or ulcers Psychiatry: Judgement and insight appear normal. Mood & affect appropriate.     Data Reviewed:   CBC:  Recent Labs Lab 04/06/17 1941 04/12/17 2015  WBC 5.2 6.2  NEUTROABS 3.4  --   HGB 12.3 11.5*  HCT 36.7 35.3*  MCV 87.2 89.1  PLT 284 182   Basic Metabolic Panel:  Recent Labs Lab 04/06/17 1941 04/12/17 2015 04/13/17 0037 04/13/17 0431 04/13/17 0742  NA 141 129* 133* 136 139  K 3.9 5.1 3.8 3.4* 3.7  CL 111 95* 102 106 109  CO2 21* 14* 18* 22 22  GLUCOSE 31* 797* 438* 155* 91  BUN 19 40* 39* 37* 34*  CREATININE 1.50* 1.92* 1.85* 1.45* 1.35*  CALCIUM 9.7 9.6 9.3 8.9 9.1   GFR: Estimated Creatinine Clearance: 56 mL/min (A) (by C-G formula based on SCr of 1.35 mg/dL (H)). Liver Function Tests:  Recent Labs Lab 04/06/17 1941  AST 16  ALT 15  ALKPHOS 119  BILITOT 0.6  PROT 7.4  ALBUMIN 3.3*   No results for input(s): LIPASE, AMYLASE in the last 168 hours. No results for input(s): AMMONIA in the last 168 hours. Coagulation Profile: No results for input(s): INR, PROTIME in the last 168 hours. Cardiac Enzymes: No results for input(s): CKTOTAL, CKMB, CKMBINDEX, TROPONINI in the last 168 hours. BNP (last 3 results) No results for input(s): PROBNP in the last 8760 hours. HbA1C: No results for input(s): HGBA1C in the last 72 hours. CBG:  Recent Labs Lab 04/13/17 0435 04/13/17 0539 04/13/17 0644 04/13/17 0755 04/13/17 0902  GLUCAP 145* 113* 80 92 102*   Lipid Profile: No results for input(s): CHOL, HDL, LDLCALC, TRIG, CHOLHDL, LDLDIRECT in the last 72 hours. Thyroid Function Tests: No results for input(s): TSH, T4TOTAL, FREET4, T3FREE, THYROIDAB in the last 72 hours. Anemia Panel: No results for input(s): VITAMINB12, FOLATE, FERRITIN, TIBC, IRON, RETICCTPCT in the last 72 hours. Sepsis Labs:  Recent Labs Lab 04/06/17 1953 04/12/17 2110  LATICACIDVEN 0.82 1.46    No results found for  this or any previous visit (from the past 240 hour(s)).       Radiology Studies: No results found.      Scheduled Meds: . amLODipine  10 mg Oral Daily  . aspirin EC  81 mg Oral Daily  . carvedilol  50 mg Oral BID  . enoxaparin (LOVENOX) injection  40 mg Subcutaneous QHS  . insulin aspart  0-5 Units Subcutaneous QHS  . insulin aspart  0-9 Units Subcutaneous TID WC  . insulin glargine  20 Units Subcutaneous Daily  . levothyroxine  137 mcg Oral Daily  . mycophenolate  500 mg Oral BID  . predniSONE  5 mg Oral QHS  . tacrolimus  0.5 mg Oral Daily  . tacrolimus  1 mg Oral QHS   Continuous Infusions: . sodium chloride 150 mL/hr at 04/13/17 0200  . dextrose 5 % and 0.45% NaCl 125 mL/hr at 04/13/17 0234  .  insulin (NOVOLIN-R) infusion 0.4 Units/hr (04/13/17 0648)     LOS: 1 day    Time spent: 31 mins     Kiet Geer Joline Maxcyhirag Hendel Gatliff, MD [facility] Pager [phone]   If 7PM-7AM, please contact night-coverage [email or web address] Password [email or web address] 04/13/2017, 10:51 AM

## 2017-04-14 DIAGNOSIS — E1110 Type 2 diabetes mellitus with ketoacidosis without coma: Principal | ICD-10-CM

## 2017-04-14 LAB — BASIC METABOLIC PANEL WITH GFR
Anion gap: 4 — ABNORMAL LOW (ref 5–15)
BUN: 24 mg/dL — ABNORMAL HIGH (ref 6–20)
CO2: 22 mmol/L (ref 22–32)
Calcium: 9.1 mg/dL (ref 8.9–10.3)
Chloride: 113 mmol/L — ABNORMAL HIGH (ref 101–111)
Creatinine, Ser: 1.42 mg/dL — ABNORMAL HIGH (ref 0.44–1.00)
GFR calc Af Amer: 49 mL/min — ABNORMAL LOW
GFR calc non Af Amer: 42 mL/min — ABNORMAL LOW
Glucose, Bld: 270 mg/dL — ABNORMAL HIGH (ref 65–99)
Potassium: 4.3 mmol/L (ref 3.5–5.1)
Sodium: 139 mmol/L (ref 135–145)

## 2017-04-14 LAB — MAGNESIUM: Magnesium: 1.7 mg/dL (ref 1.7–2.4)

## 2017-04-14 LAB — GLUCOSE, CAPILLARY
Glucose-Capillary: 327 mg/dL — ABNORMAL HIGH (ref 65–99)
Glucose-Capillary: 331 mg/dL — ABNORMAL HIGH (ref 65–99)

## 2017-04-14 LAB — HEMOGLOBIN A1C
Hgb A1c MFr Bld: 9.4 % — ABNORMAL HIGH (ref 4.8–5.6)
Mean Plasma Glucose: 223 mg/dL

## 2017-04-14 MED ORDER — INSULIN NPH ISOPHANE & REGULAR (70-30) 100 UNIT/ML ~~LOC~~ SUSP: 20.0000 [IU] | Freq: Two times a day (BID) | SUBCUTANEOUS | 6 refills | Status: DC

## 2017-04-14 NOTE — Discharge Summary (Signed)
Physician Discharge Summary  [name] ZOX:[id number] DOB: 1965/09/17 DOA: 04/12/2017  PCP: Dois Davenport, MD  Admit date: 04/12/2017 Discharge date: 04/14/2017  Admitted From: Home Disposition: Home  Recommendations for Outpatient Follow-up:  1. Follow up with PCP in 1-2 weeks 2. Follow up with the endocrinologist at [facility] in next 2-3 weeks. Patient reported showed is an appointment. 3. Insulin 70/30 change to 20 units twice daily. Continue Lovenox 3 times a day before meals and bedtime.  Home Health: None Equipment/Devices: None Discharge Condition: Stable CODE STATUS: Full Diet recommendation: Carb modified diabetic diet  Brief/Interim Summary: 51 year old female with history of osteoarthritis, chronic kidney disease, depression, hypertension, hypothyroidism, kidney transplant on immunosuppressants, type 2 diabetes came to the ER with symptoms of hyperglycemia. Last month patient had presented to the ER with hypoglycemia when her insulin 70/30 was decreased from 30 units twice daily to 13 units twice daily. At that time she was also diagnosed with cellulitis therefore discharged on oral antibiotics. After going home she stated she was doing well at first and her lower exam a cellulitis improved but the day prior to admission she noted her blood sugars were running greater than 300 and 400, and eventually read 520 when she decided to come to the hospital. In the ER she reported of polyuria and polydipsia. Her routine labs revealed that she was in diabetic ketoacidosis therefore started on glucose stabilize a protocol and admitted to the ICU. Overnight her diabetic ketoacidosis resolved and she was transitioned to subcutaneous insulin. At first she was transitioned to Lantus 20 units with sliding scale which she agreed to despite of being more costly than 70/30. Diabetic coordinator was consulted for patient education. Over the course of 48 hours patient's symptoms resolved and her  blood sugars remained stable. She was able to tolerate oral diet. On the day of discharge due to the cost patient requested to change her insulin to 70/30. I advised her her 70/30 will be increased from 13 units to 20 units twice daily. Also advised her to check her blood sugars frequently throughout the day and rest of education has been provided. She has an appointment with an endocrinologist in next 3 weeks which she will follow-up in the meantime I advised her to follow with her primary care physician.  Today she has reached maximum benefit from in hospital stay and is stable to be discharged with outpatient follow-up as stated above. She does not any complaints today and no acute events overnight.  Discharge Diagnoses:  Principal Problem:   DKA (diabetic ketoacidoses) (HCC) Active Problems:   Immunosuppression (HCC)   Hypothyroidism   H/O kidney transplant   Essential hypertension   Diarrhea   Acute renal failure superimposed on stage 3 chronic kidney disease (HCC)   QT prolongation  Diabetic ketoacidosis, resolved Uncontrolled diabetes type 2-insulin-dependent -A1c 9.4;  -After an extensive discussion with the patient today she would like to remain on 70/30 due to financial reason which I think is also appropriate as I do not want her to be medically noncompliant due to that reason. I will also discussed why the Lantus and sliding scale is better as it resembles natural physiology of the body. She would like to continue 70/30 at 20 units twice daily. She understands to check her blood sugars periodically throughout the day to avoid symptoms of excessive hyperglycemia and hypoglycemia. -Follow-up outpatient with endocrinology-has an appointment at [facility] at the end of this month.  Diarrhea, resolved   CKd stage  III, stable -Avoid nephrotoxic drugs. Follow-up outpatient  Hypothyroidism -Continue Synthroid. Will need to recheck her thyroid levels around last week of August oh first  week of September  History of hypertension -Continue amlodipine 10 mg daily and Coreg 12.5 mg twice a day.  History of renal transplant -Continue CellCept, prednisone and tacrolimus.  Discharge Instructions   Allergies as of 04/14/2017      Reactions   Ace Inhibitors Cough   Adhesive [tape] Itching, Other (See Comments)   Please use paper tape   Lisinopril Cough      Medication List    TAKE these medications   acetaminophen 325 MG tablet Commonly known as:  TYLENOL Take 2 tablets (650 mg total) by mouth every 6 (six) hours as needed for mild pain, moderate pain, fever or headache.   amLODipine 10 MG tablet Commonly known as:  NORVASC Take 1 tablet (10 mg total) by mouth daily.   aspirin EC 81 MG tablet Take 81 mg by mouth daily.   carvedilol 25 MG tablet Commonly known as:  COREG Take 50 mg by mouth 2 (two) times daily.   insulin NPH-regular Human (70-30) 100 UNIT/ML injection Commonly known as:  NOVOLIN 70/30 Inject 20 Units into the skin 2 (two) times daily with a meal. What changed:  how much to take   mycophenolate 250 MG capsule Commonly known as:  CELLCEPT Take 500 mg by mouth 2 (two) times daily.   nystatin powder Commonly known as:  MYCOSTATIN/NYSTOP Apply topically 4 (four) times daily.   predniSONE 5 MG tablet Commonly known as:  DELTASONE Take 5 mg by mouth at bedtime.   SYNTHROID 137 MCG tablet Generic drug:  levothyroxine Take 137 mcg by mouth daily.   tacrolimus 0.5 MG capsule Commonly known as:  PROGRAF Take 0.5-1 mg by mouth 2 (two) times daily. Take 0.5mg  by mouth in the morning and 1mg  by mouth at bedtime   vitamin B-12 100 MCG tablet Commonly known as:  CYANOCOBALAMIN Take 1 tablet (100 mcg total) by mouth daily.      Follow-up Information    Dois Davenport, MD. Schedule an appointment as soon as possible for a visit in 1 week(s).   Specialty:  Family Medicine Contact information: [address] [place]  [address] [phone]          Allergies  Allergen Reactions  . Ace Inhibitors Cough  . Adhesive [Tape] Itching and Other (See Comments)    Please use paper tape  . Lisinopril Cough    On your next visit with your primary care physician please Get Medicines reviewed and adjusted.   Please request your Prim.MD to go over all Hospital Tests and Procedure/Radiological results at the follow up, please get all Hospital records sent to your Prim MD by signing hospital release before you go home.   If you experience worsening of your admission symptoms, develop shortness of breath, life threatening emergency, suicidal or homicidal thoughts you must seek medical attention immediately by calling 911 or calling your MD immediately  if symptoms less severe.  You Must read complete instructions/literature along with all the possible adverse reactions/side effects for all the Medicines you take and that have been prescribed to you. Take any new Medicines after you have completely understood and accpet all the possible adverse reactions/side effects.   Do not drive, operate heavy machinery, perform activities at heights, swimming or participation in water activities or provide baby sitting services if your were admitted for syncope  or siezures until you have seen by Primary MD or a Neurologist and advised to do so again.  Do not drive when taking Pain medications.    Do not take more than prescribed Pain, Sleep and Anxiety Medications  Special Instructions: If you have smoked or chewed Tobacco  in the last 2 yrs please stop smoking, stop any regular Alcohol  and or any Recreational drug use.  Wear Seat belts while driving.   Please note  You were cared for by a hospitalist during your hospital stay. If you have any questions about your discharge medications or the care you received while you were in the hospital after you are discharged, you can call the unit and asked to speak  with the hospitalist on call if the hospitalist that took care of you is not available. Once you are discharged, your primary care physician will handle any further medical issues. Please note that NO REFILLS for any discharge medications will be authorized once you are discharged, as it is imperative that you return to your primary care physician (or establish a relationship with a primary care physician if you do not have one) for your aftercare needs so that they can reassess your need for medications and monitor your lab values.   Increase activity slowly        Consultations:  Diabetic coordinator   Procedures/Studies: Dg Chest 2 View  Result Date: 04/06/2017 CLINICAL DATA:  Bilateral lower extremity swelling.  Diabetes. EXAM: CHEST  2 VIEW COMPARISON:  03/23/2017 FINDINGS: There is subsegmental atelectasis in both mid and lower lung zones. Heart is normal in size. No pneumothorax. No pleural effusion. IMPRESSION: Bibasilar subsegmental atelectasis. Electronically Signed   By: Jolaine Click M.D.   On: 04/06/2017 20:08   Dg Chest 2 View  Result Date: 03/23/2017 CLINICAL DATA:  Shortness of breath EXAM: CHEST  2 VIEW COMPARISON:  03/01/2017 FINDINGS: Interstitial coarsening that is favored bronchitic. No Kerley lines or air bronchogram. No effusion or pneumothorax. Normal heart size and aortic contours. IMPRESSION: Bronchitic type markings without collapse or consolidation. Electronically Signed   By: Marnee Spring M.D.   On: 03/23/2017 19:36   Dg Hip Unilat With Pelvis 2-3 Views Right  Result Date: 03/26/2017 CLINICAL DATA:  Acute on chronic right hip pain. EXAM: DG HIP (WITH OR WITHOUT PELVIS) 2-3V RIGHT COMPARISON:  None. FINDINGS: Bones are diffusely demineralized. SI joints and symphysis pubis are unremarkable. Joint space in the hips is relatively well preserved and symmetric. AP and frog-leg lateral views of the right hip show no evidence for a femoral neck fracture. IMPRESSION:  Negative. Electronically Signed   By: Kennith Center M.D.   On: 03/26/2017 17:53       Subjective: No complaints and no acute events overnight.  Discharge Exam: Vitals:   04/14/17 0435 04/14/17 1052  BP: (!) 161/73 (!) 169/75  Pulse: 67 71  Resp: 20 20  Temp: 97.7 F (36.5 C)    Vitals:   04/13/17 2038 04/13/17 2328 04/14/17 0435 04/14/17 1052  BP: (!) 151/71 (!) 165/74 (!) 161/73 (!) 169/75  Pulse: 74 75 67 71  Resp: 20 18 20 20   Temp: 97.7 F (36.5 C) 98.4 F (36.9 C) 97.7 F (36.5 C)   TempSrc: Oral Oral Oral   SpO2: 100% 96% 94% 99%  Weight:      Height:        General: Pt is alert, awake, not in acute distress Cardiovascular: RRR, S1/S2 +, no rubs, no  gallops Respiratory: CTA bilaterally, no wheezing, no rhonchi Abdominal: Soft, NT, ND, bowel sounds + Extremities: no edema, no cyanosis    The results of significant diagnostics from this hospitalization (including imaging, microbiology, ancillary and laboratory) are listed below for reference.     Microbiology: No results found for this or any previous visit (from the past 240 hour(s)).   Labs: BNP (last 3 results)  Recent Labs  03/22/17 2219 03/23/17 1908 04/06/17 1941  BNP 218.0* 261.5* 137.1*   Basic Metabolic Panel:  Recent Labs Lab 04/13/17 0431 04/13/17 0742 04/13/17 1152 04/13/17 1533 04/14/17 0429  NA 136 139 135 135 139  K 3.4* 3.7 3.8 4.3 4.3  CL 106 109 105 107 113*  CO2 22 22 21* 19* 22  GLUCOSE 155* 91 150* 303* 270*  BUN 37* 34* 32* 31* 24*  CREATININE 1.45* 1.35* 1.45* 1.59* 1.42*  CALCIUM 8.9 9.1 9.0 9.0 9.1  MG  --   --   --   --  1.7   Liver Function Tests: No results for input(s): AST, ALT, ALKPHOS, BILITOT, PROT, ALBUMIN in the last 168 hours. No results for input(s): LIPASE, AMYLASE in the last 168 hours. No results for input(s): AMMONIA in the last 168 hours. CBC:  Recent Labs Lab 04/12/17 2015  WBC 6.2  HGB 11.5*  HCT 35.3*  MCV 89.1  PLT 182    Cardiac Enzymes: No results for input(s): CKTOTAL, CKMB, CKMBINDEX, TROPONINI in the last 168 hours. BNP: Invalid input(s): POCBNP CBG:  Recent Labs Lab 04/13/17 0902 04/13/17 1138 04/13/17 1641 04/13/17 2041 04/14/17 0750  GLUCAP 102* 135* 285* 205* 327*   D-Dimer No results for input(s): DDIMER in the last 72 hours. Hgb A1c  Recent Labs  04/13/17 1152  HGBA1C 9.4*   Lipid Profile No results for input(s): CHOL, HDL, LDLCALC, TRIG, CHOLHDL, LDLDIRECT in the last 72 hours. Thyroid function studies No results for input(s): TSH, T4TOTAL, T3FREE, THYROIDAB in the last 72 hours.  Invalid input(s): FREET3 Anemia work up No results for input(s): VITAMINB12, FOLATE, FERRITIN, TIBC, IRON, RETICCTPCT in the last 72 hours. Urinalysis    Component Value Date/Time   COLORURINE STRAW (A) 04/12/2017 1926   APPEARANCEUR CLEAR 04/12/2017 1926   LABSPEC 1.018 04/12/2017 1926   PHURINE 5.0 04/12/2017 1926   GLUCOSEU >=500 (A) 04/12/2017 1926   HGBUR SMALL (A) 04/12/2017 1926   BILIRUBINUR NEGATIVE 04/12/2017 1926   KETONESUR 20 (A) 04/12/2017 1926   PROTEINUR 100 (A) 04/12/2017 1926   UROBILINOGEN 0.2 03/02/2015 1153   NITRITE NEGATIVE 04/12/2017 1926   LEUKOCYTESUR NEGATIVE 04/12/2017 1926   Sepsis Labs Invalid input(s): PROCALCITONIN,  WBC,  LACTICIDVEN Microbiology No results found for this or any previous visit (from the past 240 hour(s)).   Time coordinating discharge: Over 30 minutes  SIGNED:   Dimple NanasAnkit Chirag Alessio Bogan, MD  [facility] 04/14/2017, 11:20 AM Pager   If 7PM-7AM, please contact night-coverage [email or web address] Password [email or web address]

## 2017-04-14 NOTE — Progress Notes (Signed)
[id number]/Kenniel Bergsma,BSN,RN3,CCM:[phone]: Patient discharged and has no further questions concerning home care. Orders checked for dc needs/none present

## 2017-04-14 NOTE — Progress Notes (Signed)
Pts IV removed with and clean and dry dressing intact. Pt denies pain at this time with no s/s of distress noted. Pt given a bus pass for the ride home. Pt taken to the bus stop via wheelchair with nursing staff and family present.

## 2017-04-14 NOTE — Progress Notes (Signed)
Received report from morning RN with no changes to morning assessment seen. Will continue to monitor.

## 2017-04-15 LAB — TACROLIMUS LEVEL: Tacrolimus (FK506) - LabCorp: 1.2 ng/mL — ABNORMAL LOW (ref 2.0–20.0)

## 2017-04-21 ENCOUNTER — Ambulatory Visit (HOSPITAL_COMMUNITY): Admission: RE | Admit: 2017-04-21 | Payer: Medicare Other | Source: Ambulatory Visit

## 2017-04-25 ENCOUNTER — Inpatient Hospital Stay (HOSPITAL_COMMUNITY)
Admission: EM | Admit: 2017-04-25 | Discharge: 2017-04-29 | DRG: 638 | Disposition: A | Discharge status: Home / Self Care | Payer: Medicare Other | Attending: Internal Medicine | Admitting: Internal Medicine

## 2017-04-25 ENCOUNTER — Emergency Department (HOSPITAL_COMMUNITY): Payer: Medicare Other

## 2017-04-25 ENCOUNTER — Encounter (HOSPITAL_COMMUNITY): Payer: Self-pay | Admitting: Emergency Medicine

## 2017-04-25 DIAGNOSIS — Z9114 Patient's other noncompliance with medication regimen: Secondary | ICD-10-CM | POA: Diagnosis not present

## 2017-04-25 DIAGNOSIS — E1010 Type 1 diabetes mellitus with ketoacidosis without coma: Secondary | ICD-10-CM | POA: Diagnosis not present

## 2017-04-25 DIAGNOSIS — Z961 Presence of intraocular lens: Secondary | ICD-10-CM | POA: Diagnosis present

## 2017-04-25 DIAGNOSIS — N179 Acute kidney failure, unspecified: Secondary | ICD-10-CM | POA: Diagnosis not present

## 2017-04-25 DIAGNOSIS — E1110 Type 2 diabetes mellitus with ketoacidosis without coma: Principal | ICD-10-CM | POA: Diagnosis present

## 2017-04-25 DIAGNOSIS — I10 Essential (primary) hypertension: Secondary | ICD-10-CM | POA: Diagnosis not present

## 2017-04-25 DIAGNOSIS — N183 Chronic kidney disease, stage 3 unspecified: Secondary | ICD-10-CM | POA: Diagnosis present

## 2017-04-25 DIAGNOSIS — E1122 Type 2 diabetes mellitus with diabetic chronic kidney disease: Secondary | ICD-10-CM | POA: Diagnosis present

## 2017-04-25 DIAGNOSIS — E860 Dehydration: Secondary | ICD-10-CM | POA: Diagnosis present

## 2017-04-25 DIAGNOSIS — Z7982 Long term (current) use of aspirin: Secondary | ICD-10-CM

## 2017-04-25 DIAGNOSIS — Z87891 Personal history of nicotine dependence: Secondary | ICD-10-CM

## 2017-04-25 DIAGNOSIS — Z89421 Acquired absence of other right toe(s): Secondary | ICD-10-CM

## 2017-04-25 DIAGNOSIS — E039 Hypothyroidism, unspecified: Secondary | ICD-10-CM | POA: Diagnosis present

## 2017-04-25 DIAGNOSIS — Z91048 Other nonmedicinal substance allergy status: Secondary | ICD-10-CM

## 2017-04-25 DIAGNOSIS — Z9842 Cataract extraction status, left eye: Secondary | ICD-10-CM | POA: Diagnosis not present

## 2017-04-25 DIAGNOSIS — E871 Hypo-osmolality and hyponatremia: Secondary | ICD-10-CM | POA: Diagnosis present

## 2017-04-25 DIAGNOSIS — Z89412 Acquired absence of left great toe: Secondary | ICD-10-CM | POA: Diagnosis not present

## 2017-04-25 DIAGNOSIS — Z9841 Cataract extraction status, right eye: Secondary | ICD-10-CM | POA: Diagnosis not present

## 2017-04-25 DIAGNOSIS — Z888 Allergy status to other drugs, medicaments and biological substances status: Secondary | ICD-10-CM

## 2017-04-25 DIAGNOSIS — M1990 Unspecified osteoarthritis, unspecified site: Secondary | ICD-10-CM | POA: Diagnosis present

## 2017-04-25 DIAGNOSIS — Z794 Long term (current) use of insulin: Secondary | ICD-10-CM | POA: Diagnosis not present

## 2017-04-25 DIAGNOSIS — I129 Hypertensive chronic kidney disease with stage 1 through stage 4 chronic kidney disease, or unspecified chronic kidney disease: Secondary | ICD-10-CM | POA: Diagnosis present

## 2017-04-25 DIAGNOSIS — Z940 Kidney transplant status: Secondary | ICD-10-CM

## 2017-04-25 DIAGNOSIS — R197 Diarrhea, unspecified: Secondary | ICD-10-CM | POA: Diagnosis present

## 2017-04-25 DIAGNOSIS — Z7952 Long term (current) use of systemic steroids: Secondary | ICD-10-CM

## 2017-04-25 DIAGNOSIS — Z79899 Other long term (current) drug therapy: Secondary | ICD-10-CM | POA: Diagnosis not present

## 2017-04-25 LAB — URINALYSIS, ROUTINE W REFLEX MICROSCOPIC
Bilirubin Urine: NEGATIVE
Glucose, UA: >=500 mg/dL — AB
Ketones, ur: 80 mg/dL — AB
Leukocytes, UA: NEGATIVE
Nitrite: NEGATIVE
PROTEIN: 100 mg/dL — AB
Specific Gravity, Urine: 1.02 (ref 1.005–1.030)
pH: 5 (ref 5.0–8.0)

## 2017-04-25 LAB — BASIC METABOLIC PANEL
ANION GAP: 19 — AB (ref 5–15)
ANION GAP: 12 (ref 5–15)
BUN: 49 mg/dL — ABNORMAL HIGH (ref 6–20)
BUN: 48 mg/dL — ABNORMAL HIGH (ref 6–20)
BUN: 43 mg/dL — ABNORMAL HIGH (ref 6–20)
CALCIUM: 9 mg/dL (ref 8.9–10.3)
CALCIUM: 8.3 mg/dL — AB (ref 8.9–10.3)
CHLORIDE: 94 mmol/L — AB (ref 101–111)
CO2: 10 mmol/L — AB (ref 22–32)
CO2: 13 mmol/L — AB (ref 22–32)
Calcium: 9.3 mg/dL (ref 8.9–10.3)
Chloride: 101 mmol/L (ref 101–111)
Chloride: 107 mmol/L (ref 101–111)
Creatinine, Ser: 2.58 mg/dL — ABNORMAL HIGH (ref 0.44–1.00)
Creatinine, Ser: 2.42 mg/dL — ABNORMAL HIGH (ref 0.44–1.00)
Creatinine, Ser: 2.03 mg/dL — ABNORMAL HIGH (ref 0.44–1.00)
GFR calc non Af Amer: 21 mL/min — ABNORMAL LOW (ref >60)
GFR, EST AFRICAN AMERICAN: 24 mL/min — AB (ref >60)
GFR, EST AFRICAN AMERICAN: 26 mL/min — AB (ref >60)
GFR, EST AFRICAN AMERICAN: 32 mL/min — AB (ref >60)
GFR, EST NON AFRICAN AMERICAN: 22 mL/min — AB (ref >60)
GFR, EST NON AFRICAN AMERICAN: 27 mL/min — AB (ref >60)
Glucose, Bld: 847 mg/dL (ref 65–99)
Glucose, Bld: 461 mg/dL — ABNORMAL HIGH (ref 65–99)
Glucose, Bld: 201 mg/dL — ABNORMAL HIGH (ref 65–99)
Potassium: 5.7 mmol/L — ABNORMAL HIGH (ref 3.5–5.1)
Potassium: 3.8 mmol/L (ref 3.5–5.1)
Potassium: 3.6 mmol/L (ref 3.5–5.1)
SODIUM: 132 mmol/L — AB (ref 135–145)
Sodium: 126 mmol/L — ABNORMAL LOW (ref 135–145)
Sodium: 130 mmol/L — ABNORMAL LOW (ref 135–145)

## 2017-04-25 LAB — CBG MONITORING, ED
GLUCOSE-CAPILLARY: 527 mg/dL — AB (ref 65–99)
GLUCOSE-CAPILLARY: 414 mg/dL — AB (ref 65–99)
GLUCOSE-CAPILLARY: 216 mg/dL — AB (ref 65–99)
GLUCOSE-CAPILLARY: 225 mg/dL — AB (ref 65–99)
GLUCOSE-CAPILLARY: 131 mg/dL — AB (ref 65–99)
Glucose-Capillary: >600 mg/dL (ref 65–99)
Glucose-Capillary: 535 mg/dL (ref 65–99)
Glucose-Capillary: 457 mg/dL — ABNORMAL HIGH (ref 65–99)
Glucose-Capillary: 349 mg/dL — ABNORMAL HIGH (ref 65–99)
Glucose-Capillary: 289 mg/dL — ABNORMAL HIGH (ref 65–99)

## 2017-04-25 LAB — CBC
HCT: 34.8 % — ABNORMAL LOW (ref 36.0–46.0)
HEMOGLOBIN: 10.7 g/dL — AB (ref 12.0–15.0)
MCH: 28.6 pg (ref 26.0–34.0)
MCHC: 30.7 g/dL (ref 30.0–36.0)
MCV: 93 fL (ref 78.0–100.0)
Platelets: 246 10*3/uL (ref 150–400)
RBC: 3.74 MIL/uL — AB (ref 3.87–5.11)
RDW: 13.7 % (ref 11.5–15.5)
WBC: 8.2 10*3/uL (ref 4.0–10.5)

## 2017-04-25 LAB — BLOOD GAS, VENOUS
O2 SAT: 94.8 %
PO2 VEN: 118 mmHg — AB (ref 32.0–45.0)
Patient temperature: 98.6
pH, Ven: 7.076 — CL (ref 7.250–7.430)

## 2017-04-25 LAB — MAGNESIUM: Magnesium: 1.7 mg/dL (ref 1.7–2.4)

## 2017-04-25 LAB — TROPONIN I: Troponin I: <0.03 ng/mL (ref <0.03)

## 2017-04-25 LAB — PHOSPHORUS: PHOSPHORUS: 3.9 mg/dL (ref 2.5–4.6)

## 2017-04-25 MED ORDER — ASPIRIN EC 81 MG PO TBEC
81.0000 mg | DELAYED_RELEASE_TABLET | Freq: Every day | ORAL | Status: DC
  Administered 2017-04-25 – 2017-04-29 (×5): 81 mg via ORAL
  Filled 2017-04-25 (×5): qty 1

## 2017-04-25 MED ORDER — SODIUM CHLORIDE 0.9 % IV SOLN
INTRAVENOUS | Status: DC
  Administered 2017-04-25: 9.9 [IU]/h via INTRAVENOUS
  Filled 2017-04-25 (×2): qty 1

## 2017-04-25 MED ORDER — DEXTROSE-NACL 5-0.45 % IV SOLN: INTRAVENOUS | Status: DC

## 2017-04-25 MED ORDER — ENOXAPARIN SODIUM 30 MG/0.3ML ~~LOC~~ SOLN
30.0000 mg | SUBCUTANEOUS | Status: DC
  Administered 2017-04-25 – 2017-04-26 (×2): 30 mg via SUBCUTANEOUS
  Filled 2017-04-25 (×3): qty 0.3

## 2017-04-25 MED ORDER — DEXTROSE-NACL 5-0.45 % IV SOLN
INTRAVENOUS | Status: DC
  Administered 2017-04-25: 23:00:00 via INTRAVENOUS

## 2017-04-25 MED ORDER — SODIUM CHLORIDE 0.9 % IV SOLN
INTRAVENOUS | Status: DC
  Administered 2017-04-25: 10.8 [IU]/h via INTRAVENOUS
  Administered 2017-04-25: 5.4 [IU]/h via INTRAVENOUS
  Filled 2017-04-25: qty 1

## 2017-04-25 MED ORDER — POTASSIUM CHLORIDE 10 MEQ/100ML IV SOLN
10.0000 meq | INTRAVENOUS | Status: AC
  Filled 2017-04-25: qty 100

## 2017-04-25 MED ORDER — TACROLIMUS 0.5 MG PO CAPS
0.5000 mg | ORAL_CAPSULE | Freq: Every day | ORAL | Status: DC
  Administered 2017-04-25 – 2017-04-26 (×2): 0.5 mg via ORAL
  Filled 2017-04-25 (×2): qty 1

## 2017-04-25 MED ORDER — VITAMIN B-12 100 MCG PO TABS
100.0000 ug | ORAL_TABLET | Freq: Every day | ORAL | Status: DC
  Administered 2017-04-26 – 2017-04-29 (×4): 100 ug via ORAL
  Filled 2017-04-25 (×4): qty 1

## 2017-04-25 MED ORDER — ONDANSETRON HCL 4 MG/2ML IJ SOLN
4.0000 mg | Freq: Once | INTRAMUSCULAR | Status: AC
  Administered 2017-04-25: 4 mg via INTRAVENOUS
  Filled 2017-04-25 (×2): qty 2

## 2017-04-25 MED ORDER — TACROLIMUS 1 MG PO CAPS
1.0000 mg | ORAL_CAPSULE | Freq: Every day | ORAL | Status: DC
  Administered 2017-04-26: 1 mg via ORAL
  Filled 2017-04-25 (×2): qty 1

## 2017-04-25 MED ORDER — MORPHINE SULFATE (PF) 2 MG/ML IV SOLN
4.0000 mg | Freq: Once | INTRAVENOUS | Status: AC
  Administered 2017-04-25: 4 mg via INTRAVENOUS
  Filled 2017-04-25: qty 2

## 2017-04-25 MED ORDER — POTASSIUM CHLORIDE 10 MEQ/100ML IV SOLN
10.0000 meq | INTRAVENOUS | Status: AC
  Administered 2017-04-25 (×2): 10 meq via INTRAVENOUS
  Filled 2017-04-25 (×2): qty 100

## 2017-04-25 MED ORDER — SODIUM CHLORIDE 0.9 % IV SOLN
INTRAVENOUS | Status: AC
  Administered 2017-04-25: 18:00:00 via INTRAVENOUS

## 2017-04-25 MED ORDER — SODIUM CHLORIDE 0.9 % IV BOLUS (SEPSIS)
1000.0000 mL | Freq: Once | INTRAVENOUS | Status: AC
  Administered 2017-04-25: 1000 mL via INTRAVENOUS

## 2017-04-25 MED ORDER — SODIUM CHLORIDE 0.9 % IV SOLN
INTRAVENOUS | Status: DC
  Administered 2017-04-25: 18:00:00 via INTRAVENOUS

## 2017-04-25 MED ORDER — ENOXAPARIN SODIUM 30 MG/0.3ML ~~LOC~~ SOLN: 30.0000 mg | SUBCUTANEOUS | Status: DC

## 2017-04-25 MED ORDER — SODIUM CHLORIDE 0.9 % IV SOLN
INTRAVENOUS | Status: DC
  Administered 2017-04-25: 14:00:00 via INTRAVENOUS

## 2017-04-25 MED ORDER — MYCOPHENOLATE MOFETIL 250 MG PO CAPS
500.0000 mg | ORAL_CAPSULE | Freq: Two times a day (BID) | ORAL | Status: DC
  Administered 2017-04-25 – 2017-04-29 (×8): 500 mg via ORAL
  Filled 2017-04-25 (×8): qty 2

## 2017-04-25 MED ORDER — LEVOTHYROXINE SODIUM 25 MCG PO TABS
137.0000 ug | ORAL_TABLET | Freq: Every day | ORAL | Status: DC
  Administered 2017-04-26 – 2017-04-29 (×4): 137 ug via ORAL
  Filled 2017-04-25 (×5): qty 1

## 2017-04-25 MED ORDER — CARVEDILOL 25 MG PO TABS
50.0000 mg | ORAL_TABLET | Freq: Two times a day (BID) | ORAL | Status: DC
Start: 2017-04-25 — End: 2017-04-29
  Administered 2017-04-25 – 2017-04-29 (×8): 50 mg via ORAL
  Filled 2017-04-25: qty 8
  Filled 2017-04-25: qty 2
  Filled 2017-04-25 (×2): qty 4
  Filled 2017-04-25 (×2): qty 2
  Filled 2017-04-25 (×2): qty 4

## 2017-04-25 MED ORDER — PREDNISONE 5 MG PO TABS
5.0000 mg | ORAL_TABLET | Freq: Every day | ORAL | Status: DC
  Administered 2017-04-25 – 2017-04-28 (×4): 5 mg via ORAL
  Filled 2017-04-25 (×4): qty 1

## 2017-04-25 MED ORDER — ACETAMINOPHEN 325 MG PO TABS
ORAL_TABLET | ORAL | Status: AC
  Administered 2017-04-25: 650 mg via ORAL
  Filled 2017-04-25: qty 2

## 2017-04-25 MED ORDER — ACETAMINOPHEN 325 MG PO TABS
650.0000 mg | ORAL_TABLET | Freq: Four times a day (QID) | ORAL | Status: DC | PRN
  Administered 2017-04-25 – 2017-04-29 (×7): 650 mg via ORAL
  Filled 2017-04-25 (×5): qty 2

## 2017-04-25 MED ORDER — TACROLIMUS 0.5 MG PO CAPS: 0.5000 mg | ORAL_CAPSULE | Freq: Two times a day (BID) | ORAL | Status: DC

## 2017-04-25 MED ORDER — AMLODIPINE BESYLATE 10 MG PO TABS
10.0000 mg | ORAL_TABLET | Freq: Every day | ORAL | Status: DC
  Administered 2017-04-26 – 2017-04-29 (×4): 10 mg via ORAL
  Filled 2017-04-25: qty 1
  Filled 2017-04-25: qty 2
  Filled 2017-04-25 (×2): qty 1

## 2017-04-25 NOTE — ED Notes (Signed)
Spoke to ICU charge and [facility]. Both reports that there are no beds available and will not have any d/t holding

## 2017-04-25 NOTE — ED Triage Notes (Signed)
Pt from home via EMS-Per EMS, pt reports that her glucometer stopped working so she stopped taking her Lantus. Pt was dx with DKA 3 weeks ago. Pt is A&O. Pt received approx 300 cc NS en route.

## 2017-04-25 NOTE — ED Provider Notes (Signed)
[facility]-EMERGENCY DEPT Provider Note   CSN: [id number] Arrival date & time: 04/25/17  1141     History   Chief Complaint Chief Complaint  Patient presents with  . Hyperglycemia    HPI [name] is a 51 y.o. female.  Pt presents to the ED today with elevated blood sugar.  The pt said she ran out of her test strips on 8/16.  She She has been guessing how much insulin to give herself.  She did give herself 20 units of Lantus this morning.  She has had n/v/d.      Past Medical History:  Diagnosis Date  . Arthritis    "elbows, knees, legs, back" (09/24/2015)  . CKD (chronic kidney disease)   . Daily headache   . Depression    "years ago"  . End stage renal disease (HCC)    right arm AV graft, resolved post transplant  . Hypertension   . Hypothyroid   . 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 Kidney disease 2011  . Pneumonia ~ 2007?  [name] Type II diabetes mellitus (HCC)    Insulin dependant    Patient Active Problem List   Diagnosis Date Noted  . Lower extremity cellulitis 03/23/2017  . Type II diabetes mellitus (HCC) 03/23/2017  . Anemia 01/26/2017  . Abnormal LFTs 01/26/2017  . DKA (diabetic ketoacidoses) (HCC) 08/12/2016  . Hypoglycemia   . Wound cellulitis   . CKD (chronic kidney disease) stage 3, GFR 30-59 ml/min 06/26/2016  . Cellulitis of right lower extremity   . QT prolongation 05/13/2016  . UTI (lower urinary tract infection)   . Hypomagnesemia   . Pressure ulcer 04/11/2016  . Acute encephalopathy 04/11/2016  . Altered mental status 04/10/2016  . Insulin dependent diabetes mellitus (HCC) 04/10/2016  . Diabetic ketoacidosis without coma associated with type 2 diabetes mellitus (HCC) 11/02/2015  . Uncontrolled type 1 diabetes mellitus with foot ulcer (HCC) 06/04/2015  . Foot ulcer, right (HCC)   . Diarrhea 06/02/2015  . Type 1 diabetes mellitus with diabetic foot ulcer (HCC) 06/02/2015  . Acute renal failure superimposed on  stage 3 chronic kidney disease (HCC) 06/02/2015  . Diabetic foot infection (HCC) 06/01/2015  . Essential hypertension 06/01/2015  . GERD (gastroesophageal reflux disease) 06/01/2015  . Hypotension 03/22/2014  . H/O kidney transplant 08/04/2012  . Sepsis (HCC) 08/04/2012  . Hyperkalemia 09/22/2011  . Hyponatremia 09/22/2011  . Immunosuppression (HCC)   . Hypothyroidism     Past Surgical History:  Procedure Laterality Date  . AMPUTATION Left 05/11/2014   Procedure: AMPUTATION LEFT GREAT TOE;  Surgeon: Kathryne Hitch, MD;  Location: [facility];  Service: Orthopedics;  Laterality: Left;  . AV FISTULA PLACEMENT Right    forearm  . BACK SURGERY    . CATARACT EXTRACTION W/ INTRAOCULAR LENS  IMPLANT, BILATERAL Bilateral   . CESAREAN SECTION  07/1999  . DG AV DIALYSIS GRAFT DECLOT OR    . INCISION AND DRAINAGE Right 06/02/2015   Procedure: INCISION AND DRAINAGE OF RIGHT 3rd RAY RESECTION;  Surgeon: Kathryne Hitch, MD;  Location: [facility] OR;  Service: Orthopedics;  Laterality: Right;  . 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 LUMBAR DISC SURGERY  2001  . NEPHRECTOMY TRANSPLANTED ORGAN    . TOE AMPUTATION Right    1,2 & 3rd toes.  . TUBAL LIGATION  07/1999    OB History    No data available       Home Medications  Prior to Admission medications   Medication Sig Start Date End Date Taking? Authorizing Provider  acetaminophen (TYLENOL) 325 MG tablet Take 2 tablets (650 mg total) by mouth every 6 (six) hours as needed for mild pain, moderate pain, fever or headache. 05/15/16   Hongalgi, Maximino Greenland, MD  amLODipine (NORVASC) 10 MG tablet Take 1 tablet (10 mg total) by mouth daily. 08/14/16   Regalado, Jon Billings A, MD  aspirin EC 81 MG tablet Take 81 mg by mouth daily.    [provider]  carvedilol (COREG) 25 MG tablet Take 50 mg by mouth 2 (two) times daily. 02/26/17   [provider]  insulin NPH-regular Human (NOVOLIN 70/30) (70-30) 100 UNIT/ML injection Inject 20  Units into the skin 2 (two) times daily with a meal. 04/14/17   Amin, Loura Halt, MD  mycophenolate (CELLCEPT) 250 MG capsule Take 500 mg by mouth 2 (two) times daily.     [provider]  nystatin (MYCOSTATIN/NYSTOP) powder Apply topically 4 (four) times daily. 03/29/17   Regalado, Belkys A, MD  predniSONE (DELTASONE) 5 MG tablet Take 5 mg by mouth at bedtime.  05/29/15   [provider]  SYNTHROID 137 MCG tablet Take 137 mcg by mouth daily. 04/10/17   [provider]  tacrolimus (PROGRAF) 0.5 MG capsule Take 0.5-1 mg by mouth 2 (two) times daily. Take 0.5mg  by mouth in the morning and 1mg  by mouth at bedtime    [provider]  vitamin B-12 (CYANOCOBALAMIN) 100 MCG tablet Take 1 tablet (100 mcg total) by mouth daily. 03/29/17 03/29/18  Regalado, Prentiss Bells, MD    Family History Family History  Problem Relation Age of Onset  . Emphysema Mother   . Throat cancer Mother   . COPD Mother   . Cancer Mother   . Emphysema Father   . COPD Father   . Stroke Father   . ADD / ADHD Son     Social History Social History  Substance Use Topics  . Smoking status: Former Smoker    Packs/day: 1.00    Years: 11.00    Types: Cigarettes    Quit date: 09/21/1998  . Smokeless tobacco: Never Used  . Alcohol use No     Allergies   Ace inhibitors; Adhesive [tape]; and Lisinopril   Review of Systems Review of Systems  Gastrointestinal: Positive for diarrhea, nausea and vomiting.  Endocrine: Positive for polydipsia and polyuria.  All other systems reviewed and are negative.    Physical Exam Updated Vital Signs BP 101/86 (BP Location: Right Arm)   Pulse 79   Resp 18   LMP 11/19/2011   SpO2 100%   Physical Exam  Constitutional: She is oriented to person, place, and time. She appears well-developed. She appears distressed.  HENT:  Head: Normocephalic and atraumatic.  Right Ear: External ear normal.  Left Ear: External ear normal.  Nose: Nose normal.    Mouth/Throat: Mucous membranes are dry.  Eyes: Pupils are equal, round, and reactive to light. Conjunctivae and EOM are normal.  Neck: Normal range of motion. Neck supple.  Cardiovascular: Normal rate, regular rhythm, normal heart sounds and intact distal pulses.   Pulmonary/Chest: Breath sounds normal. Tachypnea noted.  Abdominal: Soft. Bowel sounds are normal.  Musculoskeletal: Normal range of motion.  Neurological: She is alert and oriented to person, place, and time.  Skin: Skin is warm.  Psychiatric: She has a normal mood and affect. Her behavior is normal. Judgment and thought content normal.  Nursing note  and vitals reviewed.    ED Treatments / Results  Labs (all labs ordered are listed, but only abnormal results are displayed) Labs Reviewed  BASIC METABOLIC PANEL - Abnormal; Notable for the following:       Result Value   Sodium 126 (*)    Potassium 5.7 (*)    Chloride 94 (*)    CO2 <7 (*)    Glucose, Bld 847 (*)    BUN 49 (*)    Creatinine, Ser 2.58 (*)    GFR calc non Af Amer 21 (*)    GFR calc Af Amer 24 (*)    All other components within normal limits  CBC - Abnormal; Notable for the following:    RBC 3.74 (*)    Hemoglobin 10.7 (*)    HCT 34.8 (*)    All other components within normal limits  URINALYSIS, ROUTINE W REFLEX MICROSCOPIC - Abnormal; Notable for the following:    APPearance HAZY (*)    Glucose, UA >=500 (*)    Hgb urine dipstick SMALL (*)    Ketones, ur 80 (*)    Protein, ur 100 (*)    Bacteria, UA FEW (*)    Squamous Epithelial / LPF 6-30 (*)    All other components within normal limits  BLOOD GAS, VENOUS - Abnormal; Notable for the following:    pH, Ven 7.076 (*)    pO2, Ven 118.0 (*)    All other components within normal limits  CBG MONITORING, ED - Abnormal; Notable for the following:    Glucose-Capillary >600 (*)    All other components within normal limits  CBG MONITORING, ED - Abnormal; Notable for the following:     Glucose-Capillary >600 (*)    All other components within normal limits    EKG  EKG Interpretation  Date/Time:  Sunday April 25 2017 12:03:49 EDT Ventricular Rate:  82 PR Interval:    QRS Duration: 146 QT Interval:  460 QTC Calculation: 538 R Axis:   153 Text Interpretation:  Sinus rhythm Right bundle branch block Confirmed by Jacalyn Lefevre [phone]) on 04/25/2017 12:09:23 PM       Radiology Dg Chest Portable 1 View  Result Date: 04/25/2017 CLINICAL DATA:  51 year old female with a history of shortness of breath EXAM: PORTABLE CHEST 1 VIEW COMPARISON:  04/06/2017, 03/23/2017 FINDINGS: Cardiomediastinal silhouette within normal limits. No evidence of central vascular congestion. No pneumothorax or pleural effusion.  No confluent airspace disease. No displaced fracture. IMPRESSION: No radiographic evidence of acute cardiopulmonary disease. Electronically Signed   By: Gilmer Mor D.O.   On: 04/25/2017 12:24    Procedures Procedures (including critical care time)  Medications Ordered in ED Medications  insulin regular (NOVOLIN R,HUMULIN R) 100 Units in sodium chloride 0.9 % 100 mL (1 Units/mL) infusion (10.8 Units/hr Intravenous New Bag/Given 04/25/17 1356)  sodium chloride 0.9 % bolus 1,000 mL (0 mLs Intravenous Stopped 04/25/17 1400)    And  0.9 %  sodium chloride infusion ( Intravenous New Bag/Given 04/25/17 1401)  dextrose 5 %-0.45 % sodium chloride infusion ( Intravenous Not Given 04/25/17 1404)  morphine 2 MG/ML injection 4 mg (4 mg Intravenous Given 04/25/17 1400)  ondansetron (ZOFRAN) injection 4 mg (4 mg Intravenous Given 04/25/17 1400)     Initial Impression / Assessment and Plan / ED Course  I have reviewed the triage vital signs and the nursing notes.  Pertinent labs & imaging results that were available during my care of the patient were reviewed by  me and considered in my medical decision making (see chart for details).    CRITICAL CARE Performed by: Jacalyn Lefevre   Total critical care time: 30 minutes  Critical care time was exclusive of separately billable procedures and treating other patients.  Critical care was necessary to treat or prevent imminent or life-threatening deterioration.  Critical care was time spent personally by me on the following activities: development of treatment plan with patient and/or surrogate as well as nursing, discussions with consultants, evaluation of patient's response to treatment, examination of patient, obtaining history from patient or surrogate, ordering and performing treatments and interventions, ordering and review of laboratory studies, ordering and review of radiographic studies, pulse oximetry and re-evaluation of patient's condition.  Pt is feeling some better after IVFs and IV Insulin.    Pt d/w Dr. Jerolyn Center ([facility]) for admission.  Final Clinical Impressions(s) / ED Diagnoses   Final diagnoses:  Diabetic ketoacidosis without coma associated with type 1 diabetes mellitus (HCC)  AKI (acute kidney injury) (HCC)    New Prescriptions New Prescriptions   No medications on file     Jacalyn Lefevre, MD 04/25/17 1415

## 2017-04-25 NOTE — ED Notes (Signed)
Pt. CBG 131, RN,Merle made aware.

## 2017-04-25 NOTE — ED Notes (Signed)
Pt. CBG 216,RN,Merle made aware.

## 2017-04-25 NOTE — H&P (Signed)
History and Physical    [name] WUJ:[id number] DOB: 12/01/1965 DOA: 04/25/2017  PCP: Patient, [name] Patient coming from: home  Chief Complaint: high sugar  HPI: [name] is a 51 y.o. female with medical history significant of type 2 diabetes, history of kidney transplant waiting to see an endocrinologist at [facility] for possible insulin pump as a bridge to pancreatic transplant. She is admitted with hyperglycemia blood sugar was in the range of 800. Patient takes Lantus at home. But she reports that she did not have any more insulin strips to check her blood sugar so she stopped taking insulin. She has had multiple hospital admissions for the same reason. She is labile type II diabetic. She does have complaints of nausea vomiting and diarrhea. Denied any chest pain cough fever or chills at home. However she complains of shortness of breath here and she was placed on oxygen at 5 L. She does not have oxygen at home. Her chest x-ray did not reveal any evidence of infiltrates or effusion.    ED Course: She was started on IV fluids and IV insulin.   Review of Systems: As per HPI otherwise all other systems reviewed and are negative  Ambulatory Status:  Past Medical History:  Diagnosis Date  . Arthritis    "elbows, knees, legs, back" (09/24/2015)  . CKD (chronic kidney disease)   . Daily headache   . Depression    "years ago"  . End stage renal disease (HCC)    right arm AV graft, resolved post transplant  . Hypertension   . Hypothyroid   . 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 Kidney disease 2011  . Pneumonia ~ 2007?  [name] Type II diabetes mellitus (HCC)    Insulin dependant    Past Surgical History:  Procedure Laterality Date  . AMPUTATION Left 05/11/2014   Procedure: AMPUTATION LEFT GREAT TOE;  Surgeon: Kathryne Hitch, MD;  Location: [facility] OR[facility];  Service: Orthopedics;  Laterality: Left;  . AV FISTULA PLACEMENT Right    forearm  . BACK  SURGERY    . CATARACT EXTRACTION W/ INTRAOCULAR LENS  IMPLANT, BILATERAL Bilateral   . CESAREAN SECTION  07/1999  . DG AV DIALYSIS GRAFT DECLOT OR    . INCISION AND DRAINAGE Right 06/02/2015   Procedure: INCISION AND DRAINAGE OF RIGHT 3rd RAY RESECTION;  Surgeon: Kathryne Hitch, MD;  Location: [facility] OR;  Service: Orthopedics;  Laterality: Right;  . 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 LUMBAR DISC SURGERY  2001  . NEPHRECTOMY TRANSPLANTED ORGAN    . TOE AMPUTATION Right    1,2 & 3rd toes.  . TUBAL LIGATION  07/1999    Social History   Social History  . Marital status: Married    Spouse name: N/A  . Number of children: N/A  . Years of education: N/A   Occupational History  . disabled    Social History Main Topics  . Smoking status: Former Smoker    Packs/day: 1.00    Years: 11.00    Types: Cigarettes    Quit date: 09/21/1998  . Smokeless tobacco: Never Used  . Alcohol use No  . Drug use: No  . Sexual activity: No   Other Topics Concern  . Not on file   Social History Narrative  . No narrative on file    Allergies  Allergen Reactions  . Ace Inhibitors Cough  . Adhesive [Tape] Itching and Other (See  Comments)    Please use paper tape  . Lisinopril Cough    Family History  Problem Relation Age of Onset  . Emphysema Mother   . Throat cancer Mother   . COPD Mother   . Cancer Mother   . Emphysema Father   . COPD Father   . Stroke Father   . ADD / ADHD Son       Prior to Admission medications   Medication Sig Start Date End Date Taking? Authorizing Provider  acetaminophen (TYLENOL) 325 MG tablet Take 2 tablets (650 mg total) by mouth every 6 (six) hours as needed for mild pain, moderate pain, fever or headache. 05/15/16  Yes Hongalgi, Maximino Greenland, MD  amLODipine (NORVASC) 10 MG tablet Take 1 tablet (10 mg total) by mouth daily. 08/14/16  Yes Regalado, Belkys A, MD  aspirin EC 81 MG tablet Take 81 mg by mouth daily.   Yes [provider]  carvedilol  (COREG) 25 MG tablet Take 50 mg by mouth 2 (two) times daily. 02/26/17  Yes [provider]  insulin NPH-regular Human (NOVOLIN 70/30) (70-30) 100 UNIT/ML injection Inject 20 Units into the skin 2 (two) times daily with a meal. 04/14/17  Yes Amin, Ankit Chirag, MD  mycophenolate (CELLCEPT) 250 MG capsule Take 500 mg by mouth 2 (two) times daily.    Yes [provider]  predniSONE (DELTASONE) 5 MG tablet Take 5 mg by mouth at bedtime.  05/29/15  Yes [provider]  SYNTHROID 137 MCG tablet Take 137 mcg by mouth daily. 04/10/17  Yes [provider]  tacrolimus (PROGRAF) 0.5 MG capsule Take 0.5-1 mg by mouth 2 (two) times daily. Take 0.5mg  by mouth in the morning and 1mg  by mouth at bedtime   Yes [provider]  vitamin B-12 (CYANOCOBALAMIN) 100 MCG tablet Take 1 tablet (100 mcg total) by mouth daily. 03/29/17 03/29/18 Yes Regalado, Belkys A, MD  nystatin (MYCOSTATIN/NYSTOP) powder Apply topically 4 (four) times daily. Patient not taking: Reported on 04/25/2017 03/29/17   Alba Cory, MD    Physical Exam: Vitals:   04/25/17 1545 04/25/17 1600 04/25/17 1615 04/25/17 1630  BP:  (!) 109/55  (!) 112/56  Pulse:      Resp: 16 (!) 22 18 15   SpO2:         General: Appears calm and comfortable Eyes: PERRL, EOMI, normal lids, iris ENT:  grossly normal hearing, lips & tongue, mmm,oral mucosa dry. Neck:  no LAD, masses or thyromegaly Cardiovascular:  RRR, no m/r/g. No LE edema.  Respiratory:  CTA bilaterally, no w/r/r. Normal respiratory effort. Abdomen: soft, ntnd, NABS Skin:  no rash or induration seen on limited exam Musculoskeletal:  grossly normal tone BUE/BLE, good ROM, no bony abnormality Psychiatric:  grossly normal mood and affect, speech fluent and appropriate, AOx3 Neurologic:  CN 2-12 grossly intact, moves all extremities in coordinated fashion, sensation intact  Labs on Admission: I have personally reviewed following labs and imaging  studies  CBC:  Recent Labs Lab 04/25/17 1240  WBC 8.2  HGB 10.7*  HCT 34.8*  MCV 93.0  PLT 246   Basic Metabolic Panel:  Recent Labs Lab 04/25/17 1240  NA 126*  K 5.7*  CL 94*  CO2 <7*  GLUCOSE 847*  BUN 49*  CREATININE 2.58*  CALCIUM 9.3   GFR: Estimated Creatinine Clearance: 29.3 mL/min (A) (by C-G formula based on SCr of 2.58 mg/dL (H)). Liver Function Tests: No results for input(s): AST, ALT, ALKPHOS, BILITOT, PROT,  ALBUMIN in the last 168 hours. No results for input(s): LIPASE, AMYLASE in the last 168 hours. No results for input(s): AMMONIA in the last 168 hours. Coagulation Profile: No results for input(s): INR, PROTIME in the last 168 hours. Cardiac Enzymes: No results for input(s): CKTOTAL, CKMB, CKMBINDEX, TROPONINI in the last 168 hours. BNP (last 3 results) No results for input(s): PROBNP in the last 8760 hours. HbA1C: No results for input(s): HGBA1C in the last 72 hours. CBG:  Recent Labs Lab 04/25/17 1151 04/25/17 1348 04/25/17 1500 04/25/17 1558 04/25/17 1609  GLUCAP >600* >600* >600* 527* 535*   Lipid Profile: No results for input(s): CHOL, HDL, LDLCALC, TRIG, CHOLHDL, LDLDIRECT in the last 72 hours. Thyroid Function Tests: No results for input(s): TSH, T4TOTAL, FREET4, T3FREE, THYROIDAB in the last 72 hours. Anemia Panel: No results for input(s): VITAMINB12, FOLATE, FERRITIN, TIBC, IRON, RETICCTPCT in the last 72 hours. Urine analysis:    Component Value Date/Time   COLORURINE YELLOW 04/25/2017 1148   APPEARANCEUR HAZY (A) 04/25/2017 1148   LABSPEC 1.020 04/25/2017 1148   PHURINE 5.0 04/25/2017 1148   GLUCOSEU >=500 (A) 04/25/2017 1148   HGBUR SMALL (A) 04/25/2017 1148   BILIRUBINUR NEGATIVE 04/25/2017 1148   KETONESUR 80 (A) 04/25/2017 1148   PROTEINUR 100 (A) 04/25/2017 1148   UROBILINOGEN 0.2 03/02/2015 1153   NITRITE NEGATIVE 04/25/2017 1148   LEUKOCYTESUR NEGATIVE 04/25/2017 1148    Creatinine Clearance: Estimated  Creatinine Clearance: 29.3 mL/min (A) (by C-G formula based on SCr of 2.58 mg/dL (H)).  Sepsis Labs: @LABRCNTIP (procalcitonin:4,lacticidven:4) )No results found for this or any previous visit (from the past 240 hour(s)).   Radiological Exams on Admission: Dg Chest Portable 1 View  Result Date: 04/25/2017 CLINICAL DATA:  51 year old female with a history of shortness of breath EXAM: PORTABLE CHEST 1 VIEW COMPARISON:  04/06/2017, 03/23/2017 FINDINGS: Cardiomediastinal silhouette within normal limits. No evidence of central vascular congestion. No pneumothorax or pleural effusion.  No confluent airspace disease. No displaced fracture. IMPRESSION: No radiographic evidence of acute cardiopulmonary disease. Electronically Signed   By: Gilmer Mor D.O.   On: 04/25/2017 12:24    EKG: Independently reviewed.  Assessment/Plan Active Problems:   DKA (diabetic ketoacidoses) (HCC)   Diabetic ketoacidosis I have placed her on DKA protocol. Patient is very noncompliant with her insulin. Switch her back to her home dose of Lantus and short-acting insulin before meals once her blood sugars are stable and she is no more hyerglycemic.  Pseudo-hyponatremia secondary to hyperglycemia. Follow-up sodium tomorrow.  Chronic kidney disease creatinine today is 2.58. Secondary to dehydration. Continue with IV fluids and recheck levels tomorrow.  Kidney transplant continue [facility] at [facility] and tacrolimus. Follow-up renal functions tomorrow  Hypothyroidism continue Synthroid       DVT prophylaxis: lovenox Code Status: full Family Communication:  Disposition Plan:  Consults called:  Admission status:     Alwyn Ren MD [facility]  If 7PM-7AM, please contact night-coverage [email or web address] Password [email or web address]  04/25/2017, 4:55 PM

## 2017-04-25 NOTE — ED Notes (Signed)
Pt reports headache pain.

## 2017-04-25 NOTE — ED Notes (Signed)
Bed: WA17 Expected date:  Expected time:  Means of arrival:  Comments: Hyperglycemia 

## 2017-04-26 DIAGNOSIS — N179 Acute kidney failure, unspecified: Secondary | ICD-10-CM

## 2017-04-26 DIAGNOSIS — E871 Hypo-osmolality and hyponatremia: Secondary | ICD-10-CM

## 2017-04-26 DIAGNOSIS — I10 Essential (primary) hypertension: Secondary | ICD-10-CM

## 2017-04-26 DIAGNOSIS — N183 Chronic kidney disease, stage 3 (moderate): Secondary | ICD-10-CM

## 2017-04-26 LAB — BASIC METABOLIC PANEL
ANION GAP: 9 (ref 5–15)
ANION GAP: 11 (ref 5–15)
ANION GAP: 7 (ref 5–15)
Anion gap: 6 (ref 5–15)
Anion gap: 8 (ref 5–15)
Anion gap: 8 (ref 5–15)
BUN: 41 mg/dL — AB (ref 6–20)
BUN: 43 mg/dL — AB (ref 6–20)
BUN: 43 mg/dL — AB (ref 6–20)
BUN: 40 mg/dL — AB (ref 6–20)
BUN: 38 mg/dL — AB (ref 6–20)
BUN: 39 mg/dL — AB (ref 6–20)
CALCIUM: 9.3 mg/dL (ref 8.9–10.3)
CALCIUM: 9.1 mg/dL (ref 8.9–10.3)
CHLORIDE: 110 mmol/L (ref 101–111)
CHLORIDE: 108 mmol/L (ref 101–111)
CHLORIDE: 111 mmol/L (ref 101–111)
CO2: 16 mmol/L — ABNORMAL LOW (ref 22–32)
CO2: 13 mmol/L — ABNORMAL LOW (ref 22–32)
CO2: 16 mmol/L — ABNORMAL LOW (ref 22–32)
CO2: 17 mmol/L — ABNORMAL LOW (ref 22–32)
CO2: 16 mmol/L — AB (ref 22–32)
CO2: 15 mmol/L — AB (ref 22–32)
CREATININE: 1.77 mg/dL — AB (ref 0.44–1.00)
CREATININE: 1.86 mg/dL — AB (ref 0.44–1.00)
Calcium: 8.8 mg/dL — ABNORMAL LOW (ref 8.9–10.3)
Calcium: 8.6 mg/dL — ABNORMAL LOW (ref 8.9–10.3)
Calcium: 9.2 mg/dL (ref 8.9–10.3)
Calcium: 8.9 mg/dL (ref 8.9–10.3)
Chloride: 111 mmol/L (ref 101–111)
Chloride: 111 mmol/L (ref 101–111)
Chloride: 110 mmol/L (ref 101–111)
Creatinine, Ser: 1.87 mg/dL — ABNORMAL HIGH (ref 0.44–1.00)
Creatinine, Ser: 1.95 mg/dL — ABNORMAL HIGH (ref 0.44–1.00)
Creatinine, Ser: 2.02 mg/dL — ABNORMAL HIGH (ref 0.44–1.00)
Creatinine, Ser: 1.9 mg/dL — ABNORMAL HIGH (ref 0.44–1.00)
GFR calc Af Amer: 35 mL/min — ABNORMAL LOW (ref >60)
GFR calc Af Amer: 32 mL/min — ABNORMAL LOW (ref >60)
GFR calc Af Amer: 34 mL/min — ABNORMAL LOW (ref >60)
GFR calc Af Amer: 38 mL/min — ABNORMAL LOW (ref >60)
GFR calc Af Amer: 35 mL/min — ABNORMAL LOW (ref >60)
GFR calc non Af Amer: 32 mL/min — ABNORMAL LOW (ref >60)
GFR calc non Af Amer: 30 mL/min — ABNORMAL LOW (ref >60)
GFR, EST AFRICAN AMERICAN: 33 mL/min — AB (ref >60)
GFR, EST NON AFRICAN AMERICAN: 30 mL/min — AB (ref >60)
GFR, EST NON AFRICAN AMERICAN: 29 mL/min — AB (ref >60)
GFR, EST NON AFRICAN AMERICAN: 28 mL/min — AB (ref >60)
GFR, EST NON AFRICAN AMERICAN: 30 mL/min — AB (ref >60)
GLUCOSE: 114 mg/dL — AB (ref 65–99)
GLUCOSE: 81 mg/dL (ref 65–99)
Glucose, Bld: 164 mg/dL — ABNORMAL HIGH (ref 65–99)
Glucose, Bld: 306 mg/dL — ABNORMAL HIGH (ref 65–99)
Glucose, Bld: 212 mg/dL — ABNORMAL HIGH (ref 65–99)
Glucose, Bld: 251 mg/dL — ABNORMAL HIGH (ref 65–99)
POTASSIUM: 4.3 mmol/L (ref 3.5–5.1)
POTASSIUM: 4.8 mmol/L (ref 3.5–5.1)
POTASSIUM: 3.6 mmol/L (ref 3.5–5.1)
POTASSIUM: 3.8 mmol/L (ref 3.5–5.1)
Potassium: 3.9 mmol/L (ref 3.5–5.1)
Potassium: 4 mmol/L (ref 3.5–5.1)
SODIUM: 135 mmol/L (ref 135–145)
SODIUM: 132 mmol/L — AB (ref 135–145)
SODIUM: 134 mmol/L — AB (ref 135–145)
SODIUM: 134 mmol/L — AB (ref 135–145)
SODIUM: 134 mmol/L — AB (ref 135–145)
SODIUM: 134 mmol/L — AB (ref 135–145)

## 2017-04-26 LAB — CBG MONITORING, ED
GLUCOSE-CAPILLARY: 131 mg/dL — AB (ref 65–99)
GLUCOSE-CAPILLARY: 177 mg/dL — AB (ref 65–99)
GLUCOSE-CAPILLARY: 212 mg/dL — AB (ref 65–99)
GLUCOSE-CAPILLARY: 217 mg/dL — AB (ref 65–99)
GLUCOSE-CAPILLARY: 64 mg/dL — AB (ref 65–99)
GLUCOSE-CAPILLARY: 84 mg/dL (ref 65–99)
Glucose-Capillary: 357 mg/dL — ABNORMAL HIGH (ref 65–99)
Glucose-Capillary: 295 mg/dL — ABNORMAL HIGH (ref 65–99)
Glucose-Capillary: 256 mg/dL — ABNORMAL HIGH (ref 65–99)
Glucose-Capillary: 193 mg/dL — ABNORMAL HIGH (ref 65–99)
Glucose-Capillary: 267 mg/dL — ABNORMAL HIGH (ref 65–99)

## 2017-04-26 LAB — GLUCOSE, CAPILLARY
GLUCOSE-CAPILLARY: 103 mg/dL — AB (ref 65–99)
GLUCOSE-CAPILLARY: 117 mg/dL — AB (ref 65–99)
GLUCOSE-CAPILLARY: 148 mg/dL — AB (ref 65–99)
GLUCOSE-CAPILLARY: 153 mg/dL — AB (ref 65–99)
GLUCOSE-CAPILLARY: 239 mg/dL — AB (ref 65–99)
Glucose-Capillary: 78 mg/dL (ref 65–99)
Glucose-Capillary: 122 mg/dL — ABNORMAL HIGH (ref 65–99)
Glucose-Capillary: 234 mg/dL — ABNORMAL HIGH (ref 65–99)
Glucose-Capillary: 223 mg/dL — ABNORMAL HIGH (ref 65–99)

## 2017-04-26 LAB — HEMOGLOBIN A1C
HEMOGLOBIN A1C: 9.4 % — AB (ref 4.8–5.6)
MEAN PLASMA GLUCOSE: 223.08 mg/dL

## 2017-04-26 LAB — MRSA PCR SCREENING: MRSA BY PCR: POSITIVE — AB

## 2017-04-26 MED ORDER — SODIUM CHLORIDE 0.9 % IV SOLN
INTRAVENOUS | Status: DC
  Administered 2017-04-26: 3.8 [IU]/h via INTRAVENOUS
  Administered 2017-04-26: 3 [IU]/h via INTRAVENOUS
  Filled 2017-04-26 (×2): qty 1

## 2017-04-26 MED ORDER — MUPIROCIN 2 % EX OINT
1.0000 "application " | TOPICAL_OINTMENT | Freq: Two times a day (BID) | CUTANEOUS | Status: DC
  Administered 2017-04-26 – 2017-04-29 (×6): 1 via NASAL
  Filled 2017-04-26 (×3): qty 22

## 2017-04-26 MED ORDER — SODIUM CHLORIDE 0.9 % IV SOLN
INTRAVENOUS | Status: DC
  Administered 2017-04-26: 07:00:00 via INTRAVENOUS

## 2017-04-26 MED ORDER — DEXTROSE-NACL 5-0.45 % IV SOLN
INTRAVENOUS | Status: DC
  Administered 2017-04-26: 100 mL/h via INTRAVENOUS

## 2017-04-26 MED ORDER — CHLORHEXIDINE GLUCONATE CLOTH 2 % EX PADS
6.0000 | MEDICATED_PAD | Freq: Every day | CUTANEOUS | Status: DC
  Administered 2017-04-28 – 2017-04-29 (×2): 6 via TOPICAL

## 2017-04-26 MED ORDER — POTASSIUM CHLORIDE 10 MEQ/100ML IV SOLN
10.0000 meq | INTRAVENOUS | Status: AC
  Administered 2017-04-26: 10 meq via INTRAVENOUS
  Filled 2017-04-26 (×2): qty 100

## 2017-04-26 NOTE — ED Notes (Addendum)
Dr. Clearence Ped (floor coverage hospitalist) paged to update her on patient's blood glucose of 64.

## 2017-04-26 NOTE — ED Notes (Signed)
This patient will be approved for 1222 once to room is cleaned. Will notify RN in ED as soon as room is done. Thank you.

## 2017-04-26 NOTE — Progress Notes (Signed)
Received patient from ED at 230pm. Is alert and oriented x4. Patient states that she ran out of her glucose strips  two days ago and has been guessing on how much insulin to take.

## 2017-04-26 NOTE — Care Management Note (Signed)
Case Management Note  Patient Details  Name: [name] MRN: [id number] Date of Birth: 1966-03-24  Subjective/Objective:   dka                 Action/Plan: Date:  April 26, 2017 Chart reviewed for concurrent status and case management needs. Will continue to follow patient progress. Discharge Planning: following for needs Expected discharge date: [id number] Marcelle Smiling, BSN, [place], [place]   [phone]  Expected Discharge Date:   (unknown)               Expected Discharge Plan:  Home/Self Care  In-House Referral:     Discharge planning Services  CM Consult  Post Acute Care Choice:    Choice offered to:     DME Arranged:    DME Agency:     HH Arranged:    HH Agency:     Status of Service:  In process, will continue to follow  If discussed at Long Length of Stay Meetings, dates discussed:    Additional Comments:  Golda Acre, RN 04/26/2017, 4:36 PM

## 2017-04-26 NOTE — Progress Notes (Signed)
[facility] PROGRESS NOTE    Progress Note  [name]  RUE:[id number] DOB: 25-Oct-1965 DOA: 04/25/2017 PCP: Patient, [name]     Brief Narrative:   [name] is an 51 y.o. female past Casimiro Needle history of diabetes mellitus type 2, history of kidney transplant comes in with hyperglycemia and DKA due to noncompliance with her medication due to financial difficulties.  Assessment/Plan:   Diabetic ketoacidosis without coma associated with type 2 diabetes mellitus (HCC): Insulin drip. Overnight her bicarbonate 1 back to 13, her blood glucose is high. We'll restart her on IV insulin drip and Remembers do not stop the insulin drip until bicarbonate is greater than 20. She has chronic renal disease and renal transplant.  Hyponatremia Pseudo-W hyperglycemia, hopefully would improve with correction of her glucose  Essential hypertension Continue hold antihypertensive medication.  Acute renal failure superimposed on stage 3 chronic kidney disease (HCC) Likely prerenal in etiology with 2 [place] fluid hydration her baseline creatinine is around 1.5-1.7. Continue immunosuppressive therapy.  DVT prophylaxis: lovenox Family Communication:none Disposition Plan/Barrier to D/C: SDU Code Status:     Code Status Orders        Start     Ordered   04/25/17 1621  Full code  Continuous     04/25/17 1624    Code Status History    Date Active Date Inactive Code Status Order ID Comments User Context   04/25/2017  4:19 PM 04/25/2017  4:24 PM Full Code [id number]  Alwyn Ren, MD ED   04/13/2017 12:04 AM 04/14/2017  4:51 PM Full Code [id number]  Jonah Blue, MD Inpatient   03/23/2017 11:23 PM 03/29/2017  4:36 PM Full Code [id number]  Bobette Mo, MD Inpatient   03/01/2017 12:07 AM 03/03/2017  5:22 PM Full Code [id number]  Eduard Clos, MD ED   01/26/2017 11:40 PM 01/26/2017 11:40 PM Full Code [id number]  Bobette Mo, MD Inpatient   01/26/2017 11:40 PM  01/28/2017  4:54 PM Full Code [id number]  Bobette Mo, MD Inpatient   01/01/2017 11:39 PM 01/02/2017  7:27 PM Full Code [id number]  Michael Litter, MD ED   10/05/2016  3:40 PM 10/09/2016  5:35 PM Full Code [id number]  Tyrone Nine, MD Inpatient   09/16/2016  8:56 PM 09/18/2016  9:01 PM Full Code [id number]  Vassie Loll, MD Inpatient   08/12/2016 12:58 AM 08/14/2016  6:31 PM Full Code [id number]  Briscoe Deutscher, MD ED   07/21/2016 11:44 PM 07/23/2016  9:36 PM Full Code [id number]  Eduard Clos, MD Inpatient   06/26/2016  2:50 PM 06/30/2016 11:02 PM Full Code [id number]  Calvert Cantor, MD ED   05/13/2016  3:21 AM 05/15/2016 11:25 PM Full Code [id number]  Therisa Doyne, MD Inpatient   04/10/2016  9:39 AM 04/19/2016  8:32 PM Full Code [id number]  Shon Hale, MD Inpatient   03/27/2016  3:53 PM 03/31/2016 10:44 PM Full Code [id number]  Simonne Martinet, NP ED   11/03/2015 12:32 AM 11/06/2015  3:25 PM Full Code [id number]  Bobette Mo, MD Inpatient   09/24/2015  2:22 PM 09/24/2015  2:22 PM Full Code [id number]  Ozella Rocks, MD ED   09/24/2015  2:22 PM 09/26/2015  4:12 PM Full Code [id number]  Ozella Rocks, MD ED   06/01/2015 11:09 PM 06/06/2015 10:14 PM Full Code [id number]  Lorretta Harp, MD ED   03/02/2015  1:32 PM 03/04/2015  8:21 PM Full Code [id number]  Alison Murray,  MD ED   01/02/2015  3:44 PM 01/03/2015  8:28 PM Full Code [id number]  Clydia Llano, MD Inpatient   10/22/2014  7:37 PM 10/25/2014  3:07 PM Full Code [id number]  Ozella Rocks, MD ED   06/07/2014  9:43 PM 06/10/2014  3:20 PM Full Code [id number]  Hillary Bow, DO ED   05/03/2014  9:52 PM 05/12/2014  7:07 PM Full Code [id number]  Eduard Clos, MD Inpatient   04/20/2014  3:08 AM 04/21/2014  7:04 PM Full Code [id number]  Alba Cory, MD Inpatient   03/21/2014 11:51 PM 03/23/2014  7:17 PM Full Code [id number]  Carron Curie, MD Inpatient   08/04/2013 12:36 PM 08/06/2013  8:05 PM Full Code [id number]  Catarina Hartshorn, MD ED   06/05/2013  8:32  PM 06/09/2013  9:41 PM Full Code [id number]  Eduard Clos, MD Inpatient   03/05/2013  2:31 AM 03/07/2013  2:11 PM Full Code [id number]  Kathlen Mody, MD ED   01/05/2013 10:56 AM 01/07/2013  3:43 PM Full Code [id number]  Elease Etienne, MD Inpatient   08/04/2012  3:43 PM 08/08/2012  4:59 PM Full Code [id number]  Lucia Estelle ED   11/20/2011  4:11 PM 11/22/2011  1:57 PM Full Code [id number]  Marinda Elk, MD Inpatient   09/22/2011  7:07 PM 09/26/2011  4:45 PM Full Code [id number]  Cristal Ford, MD Inpatient        IV Access:    Peripheral IV   Procedures and diagnostic studies:   Dg Chest Portable 1 View  Result Date: 04/25/2017 CLINICAL DATA:  51 year old female with a history of shortness of breath EXAM: PORTABLE CHEST 1 VIEW COMPARISON:  04/06/2017, 03/23/2017 FINDINGS: Cardiomediastinal silhouette within normal limits. No evidence of central vascular congestion. No pneumothorax or pleural effusion.  No confluent airspace disease. No displaced fracture. IMPRESSION: No radiographic evidence of acute cardiopulmonary disease. Electronically Signed   By: Gilmer Mor D.O.   On: 04/25/2017 12:24     Medical Consultants:    None.  Anti-Infectives:   None  Subjective:    [name] she relates feels worst no improvement. Anorexic.  Objective:    Vitals:   04/26/17 0500 04/26/17 0530 04/26/17 0600 04/26/17 0630  BP: 119/82 (!) 108/55 116/64 (!) 121/55  Pulse: 72 70 71 70  Resp:  18    SpO2: 93% 96% 93% 97%   No intake or output data in the 24 hours ending 04/26/17 0700 There were no vitals filed for this visit.  Exam: General exam: In no acute distress tired appearing. Respiratory system: Good air movement and clear to auscultation.  Cardiovascular system:  regular rate and rhythm with positive S1-S2 no murmurs rubs gallops. Gastrointestinal system:  positive bowel sounds soft nontender nondistended. Central nervous system: awake alert and oriented 3.    Extremities: No lower extremity edema. Skin: Well-healing ulcers  Psychiatry:  judgment and insight appear normal.   Data Reviewed:    Labs: Basic Metabolic Panel:  Recent Labs Lab 04/25/17 1240 04/25/17 1755 04/25/17 2142 04/26/17 0232 04/26/17 0528  NA 126* 130* 132* 135 132*  K 5.7* 3.8 3.6 4.3 4.8  CL 94* 101 107 110 108  CO2 <7* 10* 13* 16* 13*  GLUCOSE 847* 461* 201* 164* 306*  BUN 49* 48* 43* 41* 43*  CREATININE 2.58* 2.42* 2.03* 1.87* 1.95*  CALCIUM 9.3 9.0 8.3* 8.8* 8.6*  MG  --  1.7  --   --   --  PHOS  --  3.9  --   --   --    GFR Estimated Creatinine Clearance: 38.8 mL/min (A) (by C-G formula based on SCr of 1.95 mg/dL (H)). Liver Function Tests: No results for input(s): AST, ALT, ALKPHOS, BILITOT, PROT, ALBUMIN in the last 168 hours. No results for input(s): LIPASE, AMYLASE in the last 168 hours. No results for input(s): AMMONIA in the last 168 hours. Coagulation profile No results for input(s): INR, PROTIME in the last 168 hours.  CBC:  Recent Labs Lab 04/25/17 1240  WBC 8.2  HGB 10.7*  HCT 34.8*  MCV 93.0  PLT 246   Cardiac Enzymes:  Recent Labs Lab 04/25/17 1755  TROPONINI <0.03   BNP (last 3 results) No results for input(s): PROBNP in the last 8760 hours. CBG:  Recent Labs Lab 04/25/17 2243 04/25/17 2358 04/26/17 0044 04/26/17 0201 04/26/17 0325  GLUCAP 131* 84 64* 131* 212*   D-Dimer: No results for input(s): DDIMER in the last 72 hours. Hgb A1c:  Recent Labs  04/25/17 1755  HGBA1C 9.4*   Lipid Profile: No results for input(s): CHOL, HDL, LDLCALC, TRIG, CHOLHDL, LDLDIRECT in the last 72 hours. Thyroid function studies: No results for input(s): TSH, T4TOTAL, T3FREE, THYROIDAB in the last 72 hours.  Invalid input(s): FREET3 Anemia work up: No results for input(s): VITAMINB12, FOLATE, FERRITIN, TIBC, IRON, RETICCTPCT in the last 72 hours. Sepsis Labs:  Recent Labs Lab 04/25/17 1240  WBC 8.2    Microbiology No results found for this or any previous visit (from the past 240 hour(s)).   Medications:   . amLODipine  10 mg Oral Daily  . aspirin EC  81 mg Oral Daily  . carvedilol  50 mg Oral BID  . enoxaparin (LOVENOX) injection  30 mg Subcutaneous Q24H  . levothyroxine  137 mcg Oral QAC breakfast  . mycophenolate  500 mg Oral BID  . predniSONE  5 mg Oral QHS  . tacrolimus  0.5 mg Oral QHS  . tacrolimus  1 mg Oral Daily  . vitamin B-12  100 mcg Oral Daily   Continuous Infusions: . sodium chloride Stopped (04/25/17 2201)  . sodium chloride Stopped (04/25/17 2100)  . sodium chloride 75 mL/hr at 04/26/17 0655  . dextrose 5 % and 0.45% NaCl 125 mL/hr at 04/26/17 0108  . dextrose 5 % and 0.45% NaCl    . insulin (NOVOLIN-R) infusion 3 Units/hr (04/26/17 0655)  . potassium chloride      Time spent: 35 min   LOS: 1 day   Marinda Elk  [facility] Pager [phone]  *Please refer to [email or web address], password [email or web address] to get updated schedule on who will round on this patient, as hospitalists switch teams weekly. If 7PM-7AM, please contact night-coverage at [email or web address], password [email or web address] for any overnight needs.  04/26/2017, 7:00 AM

## 2017-04-27 LAB — GLUCOSE, CAPILLARY
GLUCOSE-CAPILLARY: 140 mg/dL — AB (ref 65–99)
GLUCOSE-CAPILLARY: 143 mg/dL — AB (ref 65–99)
GLUCOSE-CAPILLARY: 137 mg/dL — AB (ref 65–99)
GLUCOSE-CAPILLARY: 150 mg/dL — AB (ref 65–99)
GLUCOSE-CAPILLARY: 184 mg/dL — AB (ref 65–99)
GLUCOSE-CAPILLARY: 196 mg/dL — AB (ref 65–99)
GLUCOSE-CAPILLARY: 173 mg/dL — AB (ref 65–99)
GLUCOSE-CAPILLARY: 122 mg/dL — AB (ref 65–99)
GLUCOSE-CAPILLARY: 107 mg/dL — AB (ref 65–99)
Glucose-Capillary: 105 mg/dL — ABNORMAL HIGH (ref 65–99)
Glucose-Capillary: 115 mg/dL — ABNORMAL HIGH (ref 65–99)
Glucose-Capillary: 120 mg/dL — ABNORMAL HIGH (ref 65–99)
Glucose-Capillary: 127 mg/dL — ABNORMAL HIGH (ref 65–99)
Glucose-Capillary: 141 mg/dL — ABNORMAL HIGH (ref 65–99)
Glucose-Capillary: 143 mg/dL — ABNORMAL HIGH (ref 65–99)
Glucose-Capillary: 170 mg/dL — ABNORMAL HIGH (ref 65–99)
Glucose-Capillary: 171 mg/dL — ABNORMAL HIGH (ref 65–99)
Glucose-Capillary: 180 mg/dL — ABNORMAL HIGH (ref 65–99)
Glucose-Capillary: 221 mg/dL — ABNORMAL HIGH (ref 65–99)
Glucose-Capillary: 233 mg/dL — ABNORMAL HIGH (ref 65–99)
Glucose-Capillary: 468 mg/dL — ABNORMAL HIGH (ref 65–99)

## 2017-04-27 LAB — BASIC METABOLIC PANEL
ANION GAP: 6 (ref 5–15)
ANION GAP: 7 (ref 5–15)
Anion gap: 8 (ref 5–15)
Anion gap: 7 (ref 5–15)
Anion gap: 5 (ref 5–15)
BUN: 36 mg/dL — ABNORMAL HIGH (ref 6–20)
BUN: 35 mg/dL — AB (ref 6–20)
BUN: 34 mg/dL — AB (ref 6–20)
BUN: 32 mg/dL — AB (ref 6–20)
BUN: 28 mg/dL — ABNORMAL HIGH (ref 6–20)
CALCIUM: 9.2 mg/dL (ref 8.9–10.3)
CALCIUM: 9.2 mg/dL (ref 8.9–10.3)
CHLORIDE: 108 mmol/L (ref 101–111)
CHLORIDE: 114 mmol/L — AB (ref 101–111)
CHLORIDE: 114 mmol/L — AB (ref 101–111)
CHLORIDE: 112 mmol/L — AB (ref 101–111)
CO2: 17 mmol/L — ABNORMAL LOW (ref 22–32)
CO2: 16 mmol/L — ABNORMAL LOW (ref 22–32)
CO2: 16 mmol/L — AB (ref 22–32)
CO2: 18 mmol/L — AB (ref 22–32)
CO2: 17 mmol/L — ABNORMAL LOW (ref 22–32)
CREATININE: 1.73 mg/dL — AB (ref 0.44–1.00)
CREATININE: 1.74 mg/dL — AB (ref 0.44–1.00)
CREATININE: 1.73 mg/dL — AB (ref 0.44–1.00)
Calcium: 9.3 mg/dL (ref 8.9–10.3)
Calcium: 9.1 mg/dL (ref 8.9–10.3)
Calcium: 9.3 mg/dL (ref 8.9–10.3)
Chloride: 112 mmol/L — ABNORMAL HIGH (ref 101–111)
Creatinine, Ser: 1.7 mg/dL — ABNORMAL HIGH (ref 0.44–1.00)
Creatinine, Ser: 1.64 mg/dL — ABNORMAL HIGH (ref 0.44–1.00)
GFR calc Af Amer: 39 mL/min — ABNORMAL LOW (ref >60)
GFR calc Af Amer: 38 mL/min — ABNORMAL LOW (ref >60)
GFR calc Af Amer: 39 mL/min — ABNORMAL LOW (ref >60)
GFR calc non Af Amer: 34 mL/min — ABNORMAL LOW (ref >60)
GFR calc non Af Amer: 33 mL/min — ABNORMAL LOW (ref >60)
GFR calc non Af Amer: 33 mL/min — ABNORMAL LOW (ref >60)
GFR calc non Af Amer: 33 mL/min — ABNORMAL LOW (ref >60)
GFR, EST AFRICAN AMERICAN: 39 mL/min — AB (ref >60)
GFR, EST AFRICAN AMERICAN: 41 mL/min — AB (ref >60)
GFR, EST NON AFRICAN AMERICAN: 36 mL/min — AB (ref >60)
Glucose, Bld: 141 mg/dL — ABNORMAL HIGH (ref 65–99)
Glucose, Bld: 188 mg/dL — ABNORMAL HIGH (ref 65–99)
Glucose, Bld: 110 mg/dL — ABNORMAL HIGH (ref 65–99)
Glucose, Bld: 205 mg/dL — ABNORMAL HIGH (ref 65–99)
Glucose, Bld: 125 mg/dL — ABNORMAL HIGH (ref 65–99)
POTASSIUM: 3.7 mmol/L (ref 3.5–5.1)
POTASSIUM: 3.9 mmol/L (ref 3.5–5.1)
Potassium: 3.7 mmol/L (ref 3.5–5.1)
Potassium: 3.9 mmol/L (ref 3.5–5.1)
Potassium: 3.9 mmol/L (ref 3.5–5.1)
SODIUM: 135 mmol/L (ref 135–145)
SODIUM: 132 mmol/L — AB (ref 135–145)
SODIUM: 137 mmol/L (ref 135–145)
SODIUM: 137 mmol/L (ref 135–145)
SODIUM: 136 mmol/L (ref 135–145)

## 2017-04-27 MED ORDER — TACROLIMUS 0.5 MG PO CAPS
0.5000 mg | ORAL_CAPSULE | Freq: Every day | ORAL | Status: DC
  Administered 2017-04-27 – 2017-04-29 (×3): 0.5 mg via ORAL
  Filled 2017-04-27 (×3): qty 1

## 2017-04-27 MED ORDER — TACROLIMUS 0.5 MG PO CAPS
0.5000 mg | ORAL_CAPSULE | Freq: Two times a day (BID) | ORAL | Status: DC
Start: 2017-04-27 — End: 2017-04-27

## 2017-04-27 MED ORDER — DEXTROSE 5 % IV SOLN
INTRAVENOUS | Status: DC
  Administered 2017-04-27 – 2017-04-28 (×2): via INTRAVENOUS

## 2017-04-27 MED ORDER — ENOXAPARIN SODIUM 40 MG/0.4ML ~~LOC~~ SOLN
40.0000 mg | SUBCUTANEOUS | Status: DC
  Administered 2017-04-27 – 2017-04-28 (×2): 40 mg via SUBCUTANEOUS
  Filled 2017-04-27 (×2): qty 0.4

## 2017-04-27 MED ORDER — TACROLIMUS 1 MG PO CAPS
1.0000 mg | ORAL_CAPSULE | Freq: Every day | ORAL | Status: DC
  Administered 2017-04-27 – 2017-04-28 (×2): 1 mg via ORAL
  Filled 2017-04-27 (×2): qty 1

## 2017-04-27 NOTE — Progress Notes (Addendum)
[facility] PROGRESS NOTE    Progress Note  PRAPTI GRUSSING  JAS:[id number] DOB: [birth year] DOA: 04/25/2017 PCP: Patient, [name]     Brief Narrative:   PALMYRA ROGACKI is an 51 y.o. female past Legrand Como history of diabetes mellitus type 2, history of kidney transplant comes in with hyperglycemia and DKA due to noncompliance with her medication due to financial difficulties.  Assessment/Plan:   Diabetic ketoacidosis without coma associated with type 2 diabetes mellitus ([place]): Still acidotics, cont IV insulin b-met q 4hrs. We'll continue IV insulin until bicarbonate is greater than 20. Continue CBGs every hourly. This is due to noncompliance.  Hyponatremia Pseudo likely due to hyperglycemia. Now resolved.  Essential hypertension Resume some of her antihypertensive medication.  Acute renal failure superimposed on stage 3 chronic kidney disease (HCC) Likely prerenal in etiology, now at baseline. baseline creatinine is around 1.5-1.7. Continue immunosuppressive therapy.  DVT prophylaxis: lovenox Family Communication:none Disposition Plan/Barrier to D/C: SDU Code Status:     Code Status Orders        Start     Ordered   04/25/17 1621  Full code  Continuous     04/25/17 1624    Code Status History    Date Active Date Inactive Code Status Order ID Comments User Context   04/25/2017  4:19 PM 04/25/2017  4:24 PM Full Code [id number]  Georgette Shell, MD ED   04/13/2017 12:04 AM 04/14/2017  4:51 PM Full Code [id number]  Karmen Bongo, MD Inpatient   03/23/2017 11:23 PM 03/29/2017  4:36 PM Full Code [id number]  Reubin Milan, MD Inpatient   03/01/2017 12:07 AM 03/03/2017  5:22 PM Full Code [id number]  Rise Patience, MD ED   01/26/2017 11:40 PM 01/26/2017 11:40 PM Full Code [id number]  Reubin Milan, MD Inpatient   01/26/2017 11:40 PM 01/28/2017  4:54 PM Full Code [id number]  Reubin Milan, MD Inpatient   01/01/2017 11:39 PM 01/02/2017  7:27 PM Full Code  [id number]  Lily Kocher, MD ED   10/05/2016  3:40 PM 10/09/2016  5:35 PM Full Code [id number]  Patrecia Pour, MD Inpatient   09/16/2016  8:56 PM 09/18/2016  9:01 PM Full Code [id number]  Barton Dubois, MD Inpatient   08/12/2016 12:58 AM 08/14/2016  6:31 PM Full Code [id number]  Vianne Bulls, MD ED   07/21/2016 11:44 PM 07/23/2016  9:36 PM Full Code [id number]  Rise Patience, MD Inpatient   06/26/2016  2:50 PM 06/30/2016 11:02 PM Full Code [id number]  Debbe Odea, MD ED   05/13/2016  3:21 AM 05/15/2016 11:25 PM Full Code [id number]  Toy Baker, MD Inpatient   04/10/2016  9:39 AM 04/19/2016  8:32 PM Full Code [id number]  Roxan Hockey, MD Inpatient   03/27/2016  3:53 PM 03/31/2016 10:44 PM Full Code [id number]  Erick Colace, NP ED   11/03/2015 12:32 AM 11/06/2015  3:25 PM Full Code [id number]  Reubin Milan, MD Inpatient   09/24/2015  2:22 PM 09/24/2015  2:22 PM Full Code [id number]  Waldemar Dickens, MD ED   09/24/2015  2:22 PM 09/26/2015  4:12 PM Full Code [id number]  Waldemar Dickens, MD ED   06/01/2015 11:09 PM 06/06/2015 10:14 PM Full Code [id number]  Ivor Costa, MD ED   03/02/2015  1:32 PM 03/04/2015  8:21 PM Full Code [id number]  Robbie Lis, MD ED   01/02/2015  3:44 PM 01/03/2015  8:28 PM Full Code [id number]  Verlee Monte, MD Inpatient  10/22/2014  7:37 PM 10/25/2014  3:07 PM Full Code [id number]  Waldemar Dickens, MD ED   06/07/2014  9:43 PM 06/10/2014  3:20 PM Full Code [id number]  Etta Quill, DO ED   05/03/2014  9:52 PM 05/12/2014  7:07 PM Full Code [id number]  Rise Patience, MD Inpatient   04/20/2014  3:08 AM 04/21/2014  7:04 PM Full Code [id number]  Elmarie Shiley, MD Inpatient   03/21/2014 11:51 PM 03/23/2014  7:17 PM Full Code [id number]  Merton Border, MD Inpatient   08/04/2013 12:36 PM 08/06/2013  8:05 PM Full Code [id number]  Orson Eva, MD ED   06/05/2013  8:32 PM 06/09/2013  9:41 PM Full Code [id number]  Rise Patience, MD Inpatient   03/05/2013  2:31 AM 03/07/2013  2:11 PM Full  Code [id number]  Hosie Poisson, MD ED   01/05/2013 10:56 AM 01/07/2013  3:43 PM Full Code [id number]  Modena Jansky, MD Inpatient   08/04/2012  3:43 PM 08/08/2012  4:59 PM Full Code [id number]  Barry Dienes ED   11/20/2011  4:11 PM 11/22/2011  1:57 PM Full Code [id number]  Charlynne Cousins, MD Inpatient   09/22/2011  7:07 PM 09/26/2011  4:45 PM Full Code [id number]  Bynum Bellows, MD Inpatient        IV Access:    Peripheral IV   Procedures and diagnostic studies:   Dg Chest Portable 1 View  Result Date: 04/25/2017 CLINICAL DATA:  51 year old female with a history of shortness of breath EXAM: PORTABLE CHEST 1 VIEW COMPARISON:  04/06/2017, 03/23/2017 FINDINGS: Cardiomediastinal silhouette within normal limits. No evidence of central vascular congestion. No pneumothorax or pleural effusion.  No confluent airspace disease. No displaced fracture. IMPRESSION: No radiographic evidence of acute cardiopulmonary disease. Electronically Signed   By: Corrie Mckusick D.O.   On: 04/25/2017 12:24     Medical Consultants:    None.  Anti-Infectives:   None  Subjective:    Ladona Horns tolerating diet feels great.  Objective:    Vitals:   04/27/17 0500 04/27/17 0600 04/27/17 0602 04/27/17 0634  BP: (!) 161/96  (!) 179/79   Pulse: 72 70  72  Resp: 15 18  (!) 21  Temp:      TempSrc:      SpO2: 98% 98%  98%  Weight:      Height:        Intake/Output Summary (Last 24 hours) at 04/27/17 0715 Last data filed at 04/27/17 6269  Gross per 24 hour  Intake          2337.47 ml  Output              702 ml  Net          1635.47 ml   Filed Weights   04/26/17 1050 04/26/17 1433  Weight: 81.6 kg (180 lb) 84.2 kg (185 lb 10 oz)    Exam: General exam: In no acute distress tired appearing. Respiratory system: Good air movement and clear to auscultation.  Cardiovascular system:  regular rate and rhythm with positive S1-S2 no murmurs rubs gallops. Gastrointestinal system:  positive bowel sounds  soft nontender nondistended. Central nervous system: awake alert and oriented 3.  Extremities: No lower extremity edema. Skin: Well-healing ulcers  Psychiatry:  judgment and insight appear normal.   Data Reviewed:    Labs: Basic Metabolic Panel:  Recent Labs Lab 04/25/17 1755  04/26/17 1134 04/26/17 1456 04/26/17 1749 04/26/17 2138 04/27/17 0459  NA  130*  < > 134* 134* 134* 134* 135  K 3.8  < > 3.9 3.6 4.0 3.8 3.7  CL 101  < > 111 111 110 111 112*  CO2 10*  < > 16* 17* 16* 15* 17*  GLUCOSE 461*  < > 212* 114* 81 251* 141*  BUN 48*  < > 43* 40* 38* 39* 36*  CREATININE 2.42*  < > 2.02* 1.90* 1.77* 1.86* 1.70*  CALCIUM 9.0  < > 9.3 9.1 9.2 8.9 9.2  MG 1.7  --   --   --   --   --   --   PHOS 3.9  --   --   --   --   --   --   < > = values in this interval not displayed. GFR Estimated Creatinine Clearance: 45 mL/min (A) (by C-G formula based on SCr of 1.7 mg/dL (H)). Liver Function Tests: No results for input(s): AST, ALT, ALKPHOS, BILITOT, PROT, ALBUMIN in the last 168 hours. No results for input(s): LIPASE, AMYLASE in the last 168 hours. No results for input(s): AMMONIA in the last 168 hours. Coagulation profile No results for input(s): INR, PROTIME in the last 168 hours.  CBC:  Recent Labs Lab 04/25/17 1240  WBC 8.2  HGB 10.7*  HCT 34.8*  MCV 93.0  PLT 246   Cardiac Enzymes:  Recent Labs Lab 04/25/17 1755  TROPONINI <0.03   BNP (last 3 results) No results for input(s): PROBNP in the last 8760 hours. CBG:  Recent Labs Lab 04/27/17 0203 04/27/17 0307 04/27/17 0417 04/27/17 0522 04/27/17 0621  GLUCAP 115* 140* 143* 137* 150*   D-Dimer: No results for input(s): DDIMER in the last 72 hours. Hgb A1c:  Recent Labs  04/25/17 1755  HGBA1C 9.4*   Lipid Profile: No results for input(s): CHOL, HDL, LDLCALC, TRIG, CHOLHDL, LDLDIRECT in the last 72 hours. Thyroid function studies: No results for input(s): TSH, T4TOTAL, T3FREE, THYROIDAB in the last  72 hours.  Invalid input(s): FREET3 Anemia work up: No results for input(s): VITAMINB12, FOLATE, FERRITIN, TIBC, IRON, RETICCTPCT in the last 72 hours. Sepsis Labs:  Recent Labs Lab 04/25/17 1240  WBC 8.2   Microbiology Recent Results (from the past 240 hour(s))  MRSA PCR Screening     Status: Abnormal   Collection Time: 04/26/17  2:31 PM  Result Value Ref Range Status   MRSA by PCR POSITIVE (A) NEGATIVE Final    Comment:        The GeneXpert MRSA Assay (FDA approved for NASAL specimens only), is one component of a comprehensive MRSA colonization surveillance program. It is not intended to diagnose MRSA infection nor to guide or monitor treatment for MRSA infections. RESULT CALLED TO, READ BACK BY AND VERIFIED WITH: ALDRIDGE,D @ 1723 ON [id number] BY POTEAT,S      Medications:   . amLODipine  10 mg Oral Daily  . aspirin EC  81 mg Oral Daily  . carvedilol  50 mg Oral BID  . Chlorhexidine Gluconate Cloth  6 each Topical Q0600  . enoxaparin (LOVENOX) injection  30 mg Subcutaneous Q24H  . levothyroxine  137 mcg Oral QAC breakfast  . mupirocin ointment  1 application Nasal BID  . mycophenolate  500 mg Oral BID  . predniSONE  5 mg Oral QHS  . tacrolimus  0.5 mg Oral QHS  . tacrolimus  1 mg Oral Daily  . vitamin B-12  100 mcg Oral Daily   Continuous Infusions: . dextrose    .  insulin (NOVOLIN-R) infusion 0.9 Units/hr (04/27/17 7616)    Time spent: 35 min   LOS: 2 days   Charlynne Cousins  [facility] Pager [phone]  *Please refer to [email or web address], password [email or web address] to get updated schedule on who will round on this patient, as hospitalists switch teams weekly. If 7PM-7AM, please contact night-coverage at [email or web address], password [email or web address] for any overnight needs.  04/27/2017, 7:15 AM

## 2017-04-27 NOTE — Progress Notes (Signed)
[id number]/Rhonda Davis,BSN,RN3,CCM/[phone]/TCT- [place] [facility] for appointment/no appointments avaiable this week/patient will have to call back on Friday of this week/patient discharge instructions note placed in chart for patient to call.

## 2017-04-27 NOTE — Progress Notes (Signed)
Inpatient Diabetes Program Recommendations  AACE/ADA: New Consensus Statement on Inpatient Glycemic Control (2015)  Target Ranges:  Prepandial:   less than 140 mg/dL      Peak postprandial:   less than 180 mg/dL (1-2 hours)      Critically ill patients:  140 - 180 mg/dL   Lab Results  Component Value Date   GLUCAP 233 (H) 04/27/2017   HGBA1C 9.4 (H) 04/25/2017    Review of Glycemic ControlResults for AEDAN, BURFORD (MRN [id number]) as of 04/27/2017 11:17  Ref. Range 04/27/2017 05:22 04/27/2017 06:21 04/27/2017 07:39 04/27/2017 08:50 04/27/2017 10:06  Glucose-Capillary Latest Ref Range: 65 - 99 mg/dL 115 (H) 726 (H) 203 (H) 196 (H) 233 (H)    Diabetes history: Type 1 DM Outpatient Diabetes medications: 70/30 20 units bid Current orders for Inpatient glycemic control:   IV insulin   Inpatient Diabetes Program Recommendations:    Spoke with patient regarding admit.  She states that she ran out of glucose strips and thus was afraid to take insulin.  She uses [employer] meter and states that the strips are about 10$ but she was waiting for her husband to get paid.  She has Medicare and strips should be covered under DME however patient will need Rx. For meter and strips and possibly use mail order for their delivery.   Patient is interested in [place] CGM which is also covered by medicare.  Encouraged patient to call endocrinologist at [facility] to discuss further.  She was appreciative of information.  Reminded patient that she MUST take insulin to prevent DKA and that she needs to have supplies on hand.  She verbalized understanding.  Patient still on insulin drip. Consider transition back to home regimen once acidosis cleared.  May want to reduce dose slightly since intake will likely be less.   Will follow.  Thanks, Beryl Meager, RN, BC-ADM Inpatient Diabetes Coordinator Pager [phone] (8a-5p)

## 2017-04-28 LAB — GLUCOSE, CAPILLARY
GLUCOSE-CAPILLARY: 122 mg/dL — AB (ref 65–99)
GLUCOSE-CAPILLARY: 168 mg/dL — AB (ref 65–99)
GLUCOSE-CAPILLARY: 232 mg/dL — AB (ref 65–99)
GLUCOSE-CAPILLARY: 261 mg/dL — AB (ref 65–99)
GLUCOSE-CAPILLARY: 321 mg/dL — AB (ref 65–99)
Glucose-Capillary: 199 mg/dL — ABNORMAL HIGH (ref 65–99)
Glucose-Capillary: 172 mg/dL — ABNORMAL HIGH (ref 65–99)
Glucose-Capillary: 151 mg/dL — ABNORMAL HIGH (ref 65–99)
Glucose-Capillary: 116 mg/dL — ABNORMAL HIGH (ref 65–99)
Glucose-Capillary: 159 mg/dL — ABNORMAL HIGH (ref 65–99)

## 2017-04-28 LAB — BASIC METABOLIC PANEL
ANION GAP: 6 (ref 5–15)
BUN: 26 mg/dL — ABNORMAL HIGH (ref 6–20)
CALCIUM: 9.1 mg/dL (ref 8.9–10.3)
CO2: 17 mmol/L — ABNORMAL LOW (ref 22–32)
CREATININE: 1.66 mg/dL — AB (ref 0.44–1.00)
Chloride: 113 mmol/L — ABNORMAL HIGH (ref 101–111)
GFR, EST AFRICAN AMERICAN: 41 mL/min — AB (ref >60)
GFR, EST NON AFRICAN AMERICAN: 35 mL/min — AB (ref >60)
GLUCOSE: 132 mg/dL — AB (ref 65–99)
Potassium: 3.7 mmol/L (ref 3.5–5.1)
Sodium: 136 mmol/L (ref 135–145)

## 2017-04-28 MED ORDER — INSULIN ASPART 100 UNIT/ML ~~LOC~~ SOLN
0.0000 [IU] | Freq: Three times a day (TID) | SUBCUTANEOUS | Status: DC
Start: 2017-04-28 — End: 2017-04-29
  Administered 2017-04-28: 3 [IU] via SUBCUTANEOUS
  Administered 2017-04-28: 7 [IU] via SUBCUTANEOUS
  Administered 2017-04-28: 2 [IU] via SUBCUTANEOUS
  Administered 2017-04-29: 7 [IU] via SUBCUTANEOUS
  Administered 2017-04-29: 2 [IU] via SUBCUTANEOUS

## 2017-04-28 MED ORDER — HYDRALAZINE HCL 20 MG/ML IJ SOLN
10.0000 mg | Freq: Once | INTRAMUSCULAR | Status: AC
  Administered 2017-04-28: 10 mg via INTRAVENOUS
  Filled 2017-04-28: qty 1

## 2017-04-28 MED ORDER — INSULIN ASPART 100 UNIT/ML ~~LOC~~ SOLN
0.0000 [IU] | Freq: Every day | SUBCUTANEOUS | Status: DC
  Administered 2017-04-28: 3 [IU] via SUBCUTANEOUS

## 2017-04-28 MED ORDER — LORAZEPAM 2 MG/ML IJ SOLN
1.0000 mg | Freq: Once | INTRAMUSCULAR | Status: AC
  Administered 2017-04-28: 1 mg via INTRAVENOUS
  Filled 2017-04-28: qty 1

## 2017-04-28 MED ORDER — INSULIN GLARGINE 100 UNIT/ML ~~LOC~~ SOLN
20.0000 [IU] | Freq: Every day | SUBCUTANEOUS | Status: DC
  Administered 2017-04-28: 20 [IU] via SUBCUTANEOUS
  Filled 2017-04-28 (×2): qty 0.2

## 2017-04-28 MED ORDER — SODIUM BICARBONATE 650 MG PO TABS
650.0000 mg | ORAL_TABLET | Freq: Two times a day (BID) | ORAL | Status: DC
  Administered 2017-04-28 – 2017-04-29 (×3): 650 mg via ORAL
  Filled 2017-04-28 (×3): qty 1

## 2017-04-28 MED ORDER — LORAZEPAM 2 MG/ML IJ SOLN: 0.5000 mg | Freq: Once | INTRAMUSCULAR | Status: DC | PRN

## 2017-04-28 MED ORDER — HYDRALAZINE HCL 25 MG PO TABS
25.0000 mg | ORAL_TABLET | Freq: Three times a day (TID) | ORAL | Status: DC
  Administered 2017-04-28 – 2017-04-29 (×5): 25 mg via ORAL
  Filled 2017-04-28 (×4): qty 1

## 2017-04-28 MED ORDER — HYDRALAZINE HCL 20 MG/ML IJ SOLN
10.0000 mg | INTRAMUSCULAR | Status: DC | PRN
  Administered 2017-04-28 – 2017-04-29 (×3): 10 mg via INTRAVENOUS
  Filled 2017-04-28: qty 1
  Filled 2017-04-28: qty 0.5
  Filled 2017-04-28 (×2): qty 1

## 2017-04-28 NOTE — Progress Notes (Signed)
PROGRESS NOTE  [name] VWP:[id number] DOB: July 26, 1966 DOA: 04/25/2017 PCP: Patient, [name]   LOS: 3 days   Brief Narrative / Interim history: 51 year old female with history of insulin-dependent diabetes mellitus, renal failure status post kidney transplantation, chronic kidney disease stage III, who was admitted to the hospital with DKA as she could not afford her CBG strips and could not check her sugars at home  Assessment & Plan: Active Problems:   Hyponatremia   Essential hypertension   Acute renal failure superimposed on stage 3 chronic kidney disease (HCC)   Diabetic ketoacidosis without coma associated with type 2 diabetes mellitus (HCC)   CKD (chronic kidney disease) stage 3, GFR 30-59 ml/min   DKA without, associated with type 2 diabetes mellitus -Patient's anion gap is closed, she was placed on a diet, will stop insulin infusion -Bicarb is still low at 17, this is likely multifactorial due to acute on chronic renal failure in addition to her DKA -Placed on long-acting insulin, sliding scale, transferred to the floor  Hypertension -Still pretty hypertensive today, will add hydralazine  Acute kidney injury on underlying chronic kidney disease stage III, status post transplantation -Continue prednisone, CellCept, tacrolimus -Creatinine has improved, currently is 1.6 which is at baseline -Non-anion gap mild metabolic acidosis persistent, add bicarb to her regimen  Pseudohyponatremia -Resolved with resolution of her CBGs  Diarrhea -Started last night, closely monitor she has been hospitalized several times this year and she is at risk for C. difficile.  If it is watery and persistent we will send a sample   DVT prophylaxis: Lovenox Code Status: Full code Family Communication: No family at bedside Disposition Plan: Transfer to floor,  Consultants:   None   Procedures:   None   Antimicrobials:  None    Subjective: - no chest pain, shortness of  breath, no abdominal pain, nausea or vomiting.  Complains of diarrhea.  Objective: Vitals:   04/28/17 0200 04/28/17 0400 04/28/17 0542 04/28/17 0800  BP: (!) 141/59 (!) 198/90 (!) 193/82   Pulse: 71 73    Resp: 14 17 19    Temp:    (!) 97.2 F (36.2 C)  TempSrc:    Oral  SpO2: 98% 100%    Weight:      Height:        Intake/Output Summary (Last 24 hours) at 04/28/17 1012 Last data filed at 04/28/17 0516  Gross per 24 hour  Intake              222 ml  Output                2 ml  Net              220 ml   Filed Weights   04/26/17 1050 04/26/17 1433  Weight: 81.6 kg (180 lb) 84.2 kg (185 lb 10 oz)    Examination:  Vitals:   04/28/17 0200 04/28/17 0400 04/28/17 0542 04/28/17 0800  BP: (!) 141/59 (!) 198/90 (!) 193/82   Pulse: 71 73    Resp: 14 17 19    Temp:    (!) 97.2 F (36.2 C)  TempSrc:    Oral  SpO2: 98% 100%    Weight:      Height:        Constitutional: NAD Eyes:  lids and conjunctivae normal ENMT: Mucous membranes are moist. Respiratory: clear to auscultation bilaterally, no wheezing, no crackles. Normal respiratory effort. No accessory muscle use.  Cardiovascular: Regular rate and  rhythm, no murmurs / rubs / gallops. No LE edema. 2+ pedal pulses. Abdomen: no tenderness. Bowel sounds positive.  Skin: no rashes, lesions, ulcers. No induration Neurologic: Nonfocal   Data Reviewed: I have independently reviewed following labs and imaging studies  CBC:  Recent Labs Lab 04/25/17 1240  WBC 8.2  HGB 10.7*  HCT 34.8*  MCV 93.0  PLT 246   Basic Metabolic Panel:  Recent Labs Lab 04/25/17 1755  04/27/17 0830 04/27/17 1246 04/27/17 1635 04/27/17 2031 04/28/17 0037  NA 130*  < > 132* 137 137 136 136  K 3.8  < > 3.7 3.9 3.9 3.9 3.7  CL 101  < > 108 114* 114* 112* 113*  CO2 10*  < > 16* 16* 18* 17* 17*  GLUCOSE 461*  < > 188* 110* 205* 125* 132*  BUN 48*  < > 35* 34* 32* 28* 26*  CREATININE 2.42*  < > 1.73* 1.74* 1.73* 1.64* 1.66*  CALCIUM 9.0  <  > 9.2 9.3 9.1 9.3 9.1  MG 1.7  --   --   --   --   --   --   PHOS 3.9  --   --   --   --   --   --   < > = values in this interval not displayed. GFR: Estimated Creatinine Clearance: 46.1 mL/min (A) (by C-G formula based on SCr of 1.66 mg/dL (H)). Liver Function Tests: No results for input(s): AST, ALT, ALKPHOS, BILITOT, PROT, ALBUMIN in the last 168 hours. No results for input(s): LIPASE, AMYLASE in the last 168 hours. No results for input(s): AMMONIA in the last 168 hours. Coagulation Profile: No results for input(s): INR, PROTIME in the last 168 hours. Cardiac Enzymes:  Recent Labs Lab 04/25/17 1755  TROPONINI <0.03   BNP (last 3 results) No results for input(s): PROBNP in the last 8760 hours. HbA1C:  Recent Labs  04/25/17 1755  HGBA1C 9.4*   CBG:  Recent Labs Lab 04/28/17 0232 04/28/17 0348 04/28/17 0444 04/28/17 0558 04/28/17 0724  GLUCAP 199* 172* 151* 116* 159*   Lipid Profile: No results for input(s): CHOL, HDL, LDLCALC, TRIG, CHOLHDL, LDLDIRECT in the last 72 hours. Thyroid Function Tests: No results for input(s): TSH, T4TOTAL, FREET4, T3FREE, THYROIDAB in the last 72 hours. Anemia Panel: No results for input(s): VITAMINB12, FOLATE, FERRITIN, TIBC, IRON, RETICCTPCT in the last 72 hours. Urine analysis:    Component Value Date/Time   COLORURINE YELLOW 04/25/2017 1148   APPEARANCEUR HAZY (A) 04/25/2017 1148   LABSPEC 1.020 04/25/2017 1148   PHURINE 5.0 04/25/2017 1148   GLUCOSEU >=500 (A) 04/25/2017 1148   HGBUR SMALL (A) 04/25/2017 1148   BILIRUBINUR NEGATIVE 04/25/2017 1148   KETONESUR 80 (A) 04/25/2017 1148   PROTEINUR 100 (A) 04/25/2017 1148   UROBILINOGEN 0.2 03/02/2015 1153   NITRITE NEGATIVE 04/25/2017 1148   LEUKOCYTESUR NEGATIVE 04/25/2017 1148   Sepsis Labs: Invalid input(s): PROCALCITONIN, LACTICIDVEN  Recent Results (from the past 240 hour(s))  MRSA PCR Screening     Status: Abnormal   Collection Time: 04/26/17  2:31 PM  Result  Value Ref Range Status   MRSA by PCR POSITIVE (A) NEGATIVE Final    Comment:        The GeneXpert MRSA Assay (FDA approved for NASAL specimens only), is one component of a comprehensive MRSA colonization surveillance program. It is not intended to diagnose MRSA infection nor to guide or monitor treatment for MRSA infections. RESULT CALLED TO, READ BACK BY  AND VERIFIED WITH: ALDRIDGE,D @ 1723 ON [id number] BY POTEAT,S       Radiology Studies: No results found.   Scheduled Meds: . amLODipine  10 mg Oral Daily  . aspirin EC  81 mg Oral Daily  . carvedilol  50 mg Oral BID  . Chlorhexidine Gluconate Cloth  6 each Topical Q0600  . enoxaparin (LOVENOX) injection  40 mg Subcutaneous Q24H  . hydrALAZINE  25 mg Oral Q8H  . insulin aspart  0-5 Units Subcutaneous QHS  . insulin aspart  0-9 Units Subcutaneous TID WC  . insulin glargine  20 Units Subcutaneous Daily  . levothyroxine  137 mcg Oral QAC breakfast  . mupirocin ointment  1 application Nasal BID  . mycophenolate  500 mg Oral BID  . predniSONE  5 mg Oral QHS  . sodium bicarbonate  650 mg Oral BID  . tacrolimus  0.5 mg Oral Daily   And  . tacrolimus  1 mg Oral QHS  . vitamin B-12  100 mcg Oral Daily   Continuous Infusions: . dextrose 25 mL/hr at 04/27/17 1428    Pamella Pert, MD, PhD [facility] Pager [phone]  If 7PM-7AM, please contact night-coverage [email or web address] Password [email or web address] 04/28/2017, 10:12 AM

## 2017-04-28 NOTE — Progress Notes (Signed)
Inpatient Diabetes Program Recommendations  AACE/ADA: New Consensus Statement on Inpatient Glycemic Control (2015)  Target Ranges:  Prepandial:   less than 140 mg/dL      Peak postprandial:   less than 180 mg/dL (1-2 hours)      Critically ill patients:  140 - 180 mg/dL   Results for BELKY, COLTHARP (MRN [id number]) as of 04/28/2017 13:49  Ref. Range 04/28/2017 07:24 04/28/2017 11:33  Glucose-Capillary Latest Ref Range: 65 - 99 mg/dL 416 (H) 606 (H)    Home DM Meds: 70/30 Insulin- 20 units BID  Current Insulin Orders: Lantus 20 units daily      Novolog Sensitive Correction Scale/ SSI (0-9 units) TID AC + HS       MD- Note patient transitioned off the IV Insulin drip this AM to Lantus and Novolog.  Please consider starting low dose Novolog Meal Coverage as well:  Novolog 4 units TID with meals (hold if pt eats <50% of meal)      --Will follow patient during hospitalization--  Ambrose Finland RN, MSN, CDE Diabetes Coordinator Inpatient Glycemic Control Team Team Pager: [phone] (8a-5p)

## 2017-04-29 DIAGNOSIS — E1110 Type 2 diabetes mellitus with ketoacidosis without coma: Principal | ICD-10-CM

## 2017-04-29 LAB — GLUCOSE, CAPILLARY
GLUCOSE-CAPILLARY: 188 mg/dL — AB (ref 65–99)
GLUCOSE-CAPILLARY: 342 mg/dL — AB (ref 65–99)
Glucose-Capillary: 187 mg/dL — ABNORMAL HIGH (ref 65–99)

## 2017-04-29 LAB — BASIC METABOLIC PANEL
ANION GAP: 4 — AB (ref 5–15)
BUN: 19 mg/dL (ref 6–20)
CALCIUM: 9 mg/dL (ref 8.9–10.3)
CO2: 21 mmol/L — ABNORMAL LOW (ref 22–32)
Chloride: 112 mmol/L — ABNORMAL HIGH (ref 101–111)
Creatinine, Ser: 1.3 mg/dL — ABNORMAL HIGH (ref 0.44–1.00)
GFR, EST AFRICAN AMERICAN: 54 mL/min — AB (ref >60)
GFR, EST NON AFRICAN AMERICAN: 47 mL/min — AB (ref >60)
GLUCOSE: 292 mg/dL — AB (ref 65–99)
POTASSIUM: 4.2 mmol/L (ref 3.5–5.1)
SODIUM: 137 mmol/L (ref 135–145)

## 2017-04-29 MED ORDER — FREESTYLE LIBRE READER DEVI
1.0000 | Freq: Four times a day (QID) | 0 refills | Status: DC
Start: 2017-04-29 — End: 2018-01-04

## 2017-04-29 MED ORDER — FREESTYLE LIBRE SENSOR SYSTEM MISC: 2 refills | Status: DC

## 2017-04-29 MED ORDER — DOXYCYCLINE HYCLATE 100 MG PO TABS
100.0000 mg | ORAL_TABLET | Freq: Two times a day (BID) | ORAL | Status: DC
  Administered 2017-04-29: 100 mg via ORAL
  Filled 2017-04-29: qty 1

## 2017-04-29 MED ORDER — INSULIN ASPART 100 UNIT/ML ~~LOC~~ SOLN
4.0000 [IU] | Freq: Three times a day (TID) | SUBCUTANEOUS | Status: DC
  Administered 2017-04-29: 4 [IU] via SUBCUTANEOUS

## 2017-04-29 MED ORDER — HYDRALAZINE HCL 25 MG PO TABS: 25.0000 mg | ORAL_TABLET | Freq: Two times a day (BID) | ORAL | 1 refills | Status: DC

## 2017-04-29 MED ORDER — INSULIN GLARGINE 100 UNIT/ML ~~LOC~~ SOLN
25.0000 [IU] | Freq: Every day | SUBCUTANEOUS | Status: DC
  Administered 2017-04-29: 25 [IU] via SUBCUTANEOUS
  Filled 2017-04-29: qty 0.25

## 2017-04-29 MED ORDER — DOXYCYCLINE HYCLATE 100 MG PO TABS: 100.0000 mg | ORAL_TABLET | Freq: Two times a day (BID) | ORAL | 0 refills | Status: DC

## 2017-04-29 NOTE — Discharge Summary (Signed)
Physician Discharge Summary  [name]:[id number] DOB: May 16, 1966 DOA: 04/25/2017  PCP: Patient, [name]  Admit date: 04/25/2017 Discharge date: 04/29/2017  Admitted From: Home Disposition:  Home  Recommendations for Outpatient Follow-up:  1. Follow up with PCP in 1-2 weeks  Home Health: none Equipment/Devices: none  Discharge Condition: stable CODE STATUS: Full code Diet recommendation: carb modified  HPI: Per Dr. Lanora Manis, [name] is a 51 y.o. female with medical history significant of type 2 diabetes, history of kidney transplant waiting to see an endocrinologist at [facility] for possible insulin pump as a bridge to pancreatic transplant. She is admitted with hyperglycemia blood sugar was in the range of 800. Patient takes Lantus at home. But she reports that she did not have any more insulin strips to check her blood sugar so she stopped taking insulin. She has had multiple hospital admissions for the same reason. She is labile type II diabetic. She does have complaints of nausea vomiting and diarrhea. Denied any chest pain cough fever or chills at home. However she complains of shortness of breath here and she was placed on oxygen at 5 L. She does not have oxygen at home. Her chest x-ray did not reveal any evidence of infiltrates or effusion.  Hospital Course: Discharge Diagnoses:  Active Problems:   Hyponatremia   Essential hypertension   Acute renal failure superimposed on stage 3 chronic kidney disease (HCC)   Diabetic ketoacidosis without coma associated with type 2 diabetes mellitus (HCC)   CKD (chronic kidney disease) stage 3, GFR 30-59 ml/min   DKA without, associated with type 2 diabetes mellitus -patient was admitted to the hospital on stepdown with insulin infusion.  She was kept n.p.o.  Patient's anion gap is closed, she was placed on subcutaneous insulin and her diet was advanced. Her acidosis resolved, her CBGs improved, she was asymptomatic able  tolerate a regular diet and was discharged home in stable condition.  Patient has been having recurrent hospitalization with DKA, this time around it seems like she ran out of her test strips and could not catch her CBGs going to high.  She currently has all supplies that she needs at home. Hypertension -patient's blood pressure was somewhat difficult to control, hydralazine was added to her regimen with improvement.  I recommend she continues this on discharge. Acute kidney injury on underlying chronic kidney disease stage III, status post transplantation -Continue prednisone, CellCept, tacrolimus, creatinine has improved, currently is 1.3 which is at baseline Pseudohyponatremia -Resolved with resolution of her CBGs Diarrhea -first night after admission patient with several loose bowel movements, this resolved on discharge   Discharge Instructions   Allergies as of 04/29/2017      Reactions   Ace Inhibitors Cough   Adhesive [tape] Itching, Other (See Comments)   Please use paper tape   Lisinopril Cough      Medication List    TAKE these medications   acetaminophen 325 MG tablet Commonly known as:  TYLENOL Take 2 tablets (650 mg total) by mouth every 6 (six) hours as needed for mild pain, moderate pain, fever or headache.   amLODipine 10 MG tablet Commonly known as:  NORVASC Take 1 tablet (10 mg total) by mouth daily.   aspirin EC 81 MG tablet Take 81 mg by mouth daily.   carvedilol 25 MG tablet Commonly known as:  COREG Take 50 mg by mouth 2 (two) times daily.   doxycycline 100 MG tablet Commonly known as:  VIBRA-TABS  Take 1 tablet (100 mg total) by mouth every 12 (twelve) hours.   FREESTYLE LIBRE READER Devi 1 each by Does not apply route 4 (four) times daily.   FREESTYLE LIBRE SENSOR SYSTEM Misc 1 sensor every 10 days   hydrALAZINE 25 MG tablet Commonly known as:  APRESOLINE Take 1 tablet (25 mg total) by mouth every 12 (twelve) hours.   insulin NPH-regular Human  (70-30) 100 UNIT/ML injection Commonly known as:  NOVOLIN 70/30 Inject 20 Units into the skin 2 (two) times daily with a meal.   mycophenolate 250 MG capsule Commonly known as:  CELLCEPT Take 500 mg by mouth 2 (two) times daily.   nystatin powder Commonly known as:  MYCOSTATIN/NYSTOP Apply topically 4 (four) times daily.   predniSONE 5 MG tablet Commonly known as:  DELTASONE Take 5 mg by mouth at bedtime.   SYNTHROID 137 MCG tablet Generic drug:  levothyroxine Take 137 mcg by mouth daily.   tacrolimus 0.5 MG capsule Commonly known as:  PROGRAF Take 0.5-1 mg by mouth 2 (two) times daily. Take 0.5mg  by mouth in the morning and 1mg  by mouth at bedtime   vitamin B-12 100 MCG tablet Commonly known as:  CYANOCOBALAMIN Take 1 tablet (100 mcg total) by mouth daily.            Discharge Care Instructions        Start     Ordered   04/29/17 0000  doxycycline (VIBRA-TABS) 100 MG tablet  Every 12 hours     04/29/17 1244   04/29/17 0000  Continuous Blood Gluc Sensor (FREESTYLE LIBRE SENSOR SYSTEM) MISC     04/29/17 1244   04/29/17 0000  Continuous Blood Gluc Receiver (FREESTYLE LIBRE READER) DEVI  4 times daily     04/29/17 1244   04/29/17 0000  hydrALAZINE (APRESOLINE) 25 MG tablet  Every 12 hours     04/29/17 1246     Follow-up Information    [facility]. Call.   Why:  [phone] for hospital follow up appointment at [phone] and ask about getting into the orange card program. Contact information: [address] [facility] [address] [phone]         Allergies  Allergen Reactions  . Ace Inhibitors Cough  . Adhesive [Tape] Itching and Other (See Comments)    Please use paper tape  . Lisinopril Cough    Consultations:  None   Procedures/Studies:  Dg Chest 2 View  Result Date: 04/06/2017 CLINICAL DATA:  Bilateral lower extremity swelling.  Diabetes. EXAM: CHEST  2 VIEW COMPARISON:  03/23/2017  FINDINGS: There is subsegmental atelectasis in both mid and lower lung zones. Heart is normal in size. No pneumothorax. No pleural effusion. IMPRESSION: Bibasilar subsegmental atelectasis. Electronically Signed   By: Jolaine Click M.D.   On: 04/06/2017 20:08   Dg Chest Portable 1 View  Result Date: 04/25/2017 CLINICAL DATA:  51 year old female with a history of shortness of breath EXAM: PORTABLE CHEST 1 VIEW COMPARISON:  04/06/2017, 03/23/2017 FINDINGS: Cardiomediastinal silhouette within normal limits. No evidence of central vascular congestion. No pneumothorax or pleural effusion.  No confluent airspace disease. No displaced fracture. IMPRESSION: No radiographic evidence of acute cardiopulmonary disease. Electronically Signed   By: Gilmer Mor D.O.   On: 04/25/2017 12:24    Subjective: - no chest pain, shortness of breath, no abdominal pain, nausea or vomiting.   Discharge Exam: Vitals:   04/29/17 0152 04/29/17 0440  BP: (!) 149/83 (!) 146/89  Pulse: 78 86  Resp: 18 16  Temp: 98 F (36.7 C) 98.7 F (37.1 C)  SpO2: 98% 99%   Vitals:   04/28/17 2300 04/29/17 0000 04/29/17 0152 04/29/17 0440  BP: (!) 154/69 (!) 176/79 (!) 149/83 (!) 146/89  Pulse: 81 76 78 86  Resp: 19 15 18 16   Temp:   98 F (36.7 C) 98.7 F (37.1 C)  TempSrc:   Oral Oral  SpO2: 98% 99% 98% 99%  Weight:   86.6 kg (191 lb)   Height:   5\' 8"  (1.727 m)     General: Pt is alert, awake, not in acute distress Cardiovascular: RRR, S1/S2 +, no rubs, no gallops Respiratory: CTA bilaterally, no wheezing, no rhonchi Abdominal: Soft, NT, ND, bowel sounds + Extremities: no edema, no cyanosis    The results of significant diagnostics from this hospitalization (including imaging, microbiology, ancillary and laboratory) are listed below for reference.     Microbiology: Recent Results (from the past 240 hour(s))  MRSA PCR Screening     Status: Abnormal   Collection Time: 04/26/17  2:31 PM  Result Value Ref Range  Status   MRSA by PCR POSITIVE (A) NEGATIVE Final    Comment:        The GeneXpert MRSA Assay (FDA approved for NASAL specimens only), is one component of a comprehensive MRSA colonization surveillance program. It is not intended to diagnose MRSA infection nor to guide or monitor treatment for MRSA infections. RESULT CALLED TO, READ BACK BY AND VERIFIED WITH: ALDRIDGE,D @ 1723 ON [id number] BY POTEAT,S      Labs: BNP (last 3 results)  Recent Labs  03/22/17 2219 03/23/17 1908 04/06/17 1941  BNP 218.0* 261.5* 137.1*   Basic Metabolic Panel:  Recent Labs Lab 04/25/17 1755  04/27/17 1246 04/27/17 1635 04/27/17 2031 04/28/17 0037 04/29/17 0330  NA 130*  < > 137 137 136 136 137  K 3.8  < > 3.9 3.9 3.9 3.7 4.2  CL 101  < > 114* 114* 112* 113* 112*  CO2 10*  < > 16* 18* 17* 17* 21*  GLUCOSE 461*  < > 110* 205* 125* 132* 292*  BUN 48*  < > 34* 32* 28* 26* 19  CREATININE 2.42*  < > 1.74* 1.73* 1.64* 1.66* 1.30*  CALCIUM 9.0  < > 9.3 9.1 9.3 9.1 9.0  MG 1.7  --   --   --   --   --   --   PHOS 3.9  --   --   --   --   --   --   < > = values in this interval not displayed. Liver Function Tests: No results for input(s): AST, ALT, ALKPHOS, BILITOT, PROT, ALBUMIN in the last 168 hours. No results for input(s): LIPASE, AMYLASE in the last 168 hours. No results for input(s): AMMONIA in the last 168 hours. CBC:  Recent Labs Lab 04/25/17 1240  WBC 8.2  HGB 10.7*  HCT 34.8*  MCV 93.0  PLT 246   Cardiac Enzymes:  Recent Labs Lab 04/25/17 1755  TROPONINI <0.03   BNP: Invalid input(s): POCBNP CBG:  Recent Labs Lab 04/28/17 1539 04/28/17 2120 04/29/17 0146 04/29/17 0727 04/29/17 1215  GLUCAP 321* 261* 188* 342* 187*   D-Dimer No results for input(s): DDIMER in the last 72 hours. Hgb A1c No results for input(s): HGBA1C in the last 72 hours. Lipid Profile No results for input(s): CHOL, HDL, LDLCALC, TRIG, CHOLHDL, LDLDIRECT in the last 72 hours.  Thyroid  function studies No results for input(s): TSH, T4TOTAL, T3FREE, THYROIDAB in the last 72 hours.  Invalid input(s): FREET3 Anemia work up No results for input(s): VITAMINB12, FOLATE, FERRITIN, TIBC, IRON, RETICCTPCT in the last 72 hours. Urinalysis    Component Value Date/Time   COLORURINE YELLOW 04/25/2017 1148   APPEARANCEUR HAZY (A) 04/25/2017 1148   LABSPEC 1.020 04/25/2017 1148   PHURINE 5.0 04/25/2017 1148   GLUCOSEU >=500 (A) 04/25/2017 1148   HGBUR SMALL (A) 04/25/2017 1148   BILIRUBINUR NEGATIVE 04/25/2017 1148   KETONESUR 80 (A) 04/25/2017 1148   PROTEINUR 100 (A) 04/25/2017 1148   UROBILINOGEN 0.2 03/02/2015 1153   NITRITE NEGATIVE 04/25/2017 1148   LEUKOCYTESUR NEGATIVE 04/25/2017 1148   Sepsis Labs Invalid input(s): PROCALCITONIN,  WBC,  LACTICIDVEN Microbiology Recent Results (from the past 240 hour(s))  MRSA PCR Screening     Status: Abnormal   Collection Time: 04/26/17  2:31 PM  Result Value Ref Range Status   MRSA by PCR POSITIVE (A) NEGATIVE Final    Comment:        The GeneXpert MRSA Assay (FDA approved for NASAL specimens only), is one component of a comprehensive MRSA colonization surveillance program. It is not intended to diagnose MRSA infection nor to guide or monitor treatment for MRSA infections. RESULT CALLED TO, READ BACK BY AND VERIFIED WITH: ALDRIDGE,D @ 1723 ON [id number] BY POTEAT,S      Time coordinating discharge: 35 minutes  SIGNED:  Pamella Pert, MD  [facility] 04/29/2017, 12:46 PM Pager [phone]  If 7PM-7AM, please contact night-coverage [email or web address] Password [email or web address]

## 2017-04-29 NOTE — Discharge Instructions (Signed)
Follow with [facility] in 1-2 weeks  Please get a complete blood count and chemistry panel checked by your Primary MD at your next visit, and again as instructed by your Primary MD. Please get your medications reviewed and adjusted by your Primary MD.  Please request your Primary MD to go over all Hospital Tests and Procedure/Radiological results at the follow up, please get all Hospital records sent to your Prim MD by signing hospital release before you go home.  If you had Pneumonia of Lung problems at the Hospital: Please get a 2 view Chest X ray done in 6-8 weeks after hospital discharge or sooner if instructed by your Primary MD.  If you have Congestive Heart Failure: Please call your Cardiologist or Primary MD anytime you have any of the following symptoms:  1) 3 pound weight gain in 24 hours or 5 pounds in 1 week  2) shortness of breath, with or without a dry hacking cough  3) swelling in the hands, feet or stomach  4) if you have to sleep on extra pillows at night in order to breathe  Follow cardiac low salt diet and 1.5 lit/day fluid restriction.  If you have diabetes Accuchecks 4 times/day, Once in AM empty stomach and then before each meal. Log in all results and show them to your primary doctor at your next visit. If any glucose reading is under 80 or above 300 call your primary MD immediately.  If you have Seizure/Convulsions/Epilepsy: Please do not drive, operate heavy machinery, participate in activities at heights or participate in high speed sports until you have seen by Primary MD or a Neurologist and advised to do so again.  If you had Gastrointestinal Bleeding: Please ask your Primary MD to check a complete blood count within one week of discharge or at your next visit. Your endoscopic/colonoscopic biopsies that are pending at the time of discharge, will also need to followed by your Primary MD.  Get Medicines reviewed and adjusted. Please take all  your medications with you for your next visit with your Primary MD  Please request your Primary MD to go over all hospital tests and procedure/radiological results at the follow up, please ask your Primary MD to get all Hospital records sent to his/her office.  If you experience worsening of your admission symptoms, develop shortness of breath, life threatening emergency, suicidal or homicidal thoughts you must seek medical attention immediately by calling 911 or calling your MD immediately  if symptoms less severe.  You must read complete instructions/literature along with all the possible adverse reactions/side effects for all the Medicines you take and that have been prescribed to you. Take any new Medicines after you have completely understood and accpet all the possible adverse reactions/side effects.   Do not drive or operate heavy machinery when taking Pain medications.   Do not take more than prescribed Pain, Sleep and Anxiety Medications  Special Instructions: If you have smoked or chewed Tobacco  in the last 2 yrs please stop smoking, stop any regular Alcohol  and or any Recreational drug use.  Wear Seat belts while driving.  Please note You were cared for by a hospitalist during your hospital stay. If you have any questions about your discharge medications or the care you received while you were in the hospital after you are discharged, you can call the unit and asked to speak with the hospitalist on call if the hospitalist that took care of you is not available.  Once you are discharged, your primary care physician will handle any further medical issues. Please note that NO REFILLS for any discharge medications will be authorized once you are discharged, as it is imperative that you return to your primary care physician (or establish a relationship with a primary care physician if you do not have one) for your aftercare needs so that they can reassess your need for medications and monitor  your lab values.  You can reach the hospitalist office at phone [phone] or fax [phone]   If you do not have a primary care physician, you can call [phone] for a physician referral.  Activity: As tolerated with Full fall precautions use walker/cane & assistance as needed  Diet: carb modified  Disposition Home

## 2017-04-29 NOTE — Care Management Important Message (Signed)
Important Message  Patient Details IM Letter given to Nora/Case Manager to present to Patient Name: [name] MRN: [id number] Date of Birth: [birth year]   Medicare Important Message Given:  Yes    Caren Macadam 04/29/2017, 11:05 AM

## 2017-05-09 ENCOUNTER — Emergency Department (HOSPITAL_COMMUNITY): Payer: Medicare Other

## 2017-05-09 ENCOUNTER — Observation Stay (HOSPITAL_COMMUNITY)
Admission: EM | Admit: 2017-05-09 | Discharge: 2017-05-10 | Disposition: A | Discharge status: Home / Self Care | Payer: Medicare Other | Attending: Internal Medicine | Admitting: Internal Medicine

## 2017-05-09 ENCOUNTER — Encounter (HOSPITAL_COMMUNITY): Payer: Self-pay

## 2017-05-09 DIAGNOSIS — Z794 Long term (current) use of insulin: Secondary | ICD-10-CM | POA: Insufficient documentation

## 2017-05-09 DIAGNOSIS — E1122 Type 2 diabetes mellitus with diabetic chronic kidney disease: Secondary | ICD-10-CM | POA: Diagnosis not present

## 2017-05-09 DIAGNOSIS — N183 Chronic kidney disease, stage 3 unspecified: Secondary | ICD-10-CM | POA: Diagnosis present

## 2017-05-09 DIAGNOSIS — Z87891 Personal history of nicotine dependence: Secondary | ICD-10-CM | POA: Insufficient documentation

## 2017-05-09 DIAGNOSIS — E1165 Type 2 diabetes mellitus with hyperglycemia: Principal | ICD-10-CM | POA: Insufficient documentation

## 2017-05-09 DIAGNOSIS — M7989 Other specified soft tissue disorders: Secondary | ICD-10-CM

## 2017-05-09 DIAGNOSIS — W19XXXA Unspecified fall, initial encounter: Secondary | ICD-10-CM

## 2017-05-09 DIAGNOSIS — Z79899 Other long term (current) drug therapy: Secondary | ICD-10-CM | POA: Insufficient documentation

## 2017-05-09 DIAGNOSIS — L03119 Cellulitis of unspecified part of limb: Secondary | ICD-10-CM | POA: Diagnosis present

## 2017-05-09 DIAGNOSIS — R739 Hyperglycemia, unspecified: Secondary | ICD-10-CM | POA: Diagnosis present

## 2017-05-09 DIAGNOSIS — I129 Hypertensive chronic kidney disease with stage 1 through stage 4 chronic kidney disease, or unspecified chronic kidney disease: Secondary | ICD-10-CM | POA: Insufficient documentation

## 2017-05-09 DIAGNOSIS — E039 Hypothyroidism, unspecified: Secondary | ICD-10-CM | POA: Diagnosis not present

## 2017-05-09 DIAGNOSIS — Z940 Kidney transplant status: Secondary | ICD-10-CM

## 2017-05-09 DIAGNOSIS — R0602 Shortness of breath: Secondary | ICD-10-CM | POA: Diagnosis not present

## 2017-05-09 DIAGNOSIS — R52 Pain, unspecified: Secondary | ICD-10-CM

## 2017-05-09 DIAGNOSIS — M1990 Unspecified osteoarthritis, unspecified site: Secondary | ICD-10-CM | POA: Insufficient documentation

## 2017-05-09 DIAGNOSIS — Z9114 Patient's other noncompliance with medication regimen: Secondary | ICD-10-CM | POA: Insufficient documentation

## 2017-05-09 DIAGNOSIS — E871 Hypo-osmolality and hyponatremia: Secondary | ICD-10-CM | POA: Diagnosis present

## 2017-05-09 DIAGNOSIS — R51 Headache: Secondary | ICD-10-CM | POA: Diagnosis not present

## 2017-05-09 DIAGNOSIS — IMO0001 Reserved for inherently not codable concepts without codable children: Secondary | ICD-10-CM

## 2017-05-09 DIAGNOSIS — I10 Essential (primary) hypertension: Secondary | ICD-10-CM | POA: Diagnosis present

## 2017-05-09 DIAGNOSIS — K219 Gastro-esophageal reflux disease without esophagitis: Secondary | ICD-10-CM | POA: Diagnosis not present

## 2017-05-09 DIAGNOSIS — E119 Type 2 diabetes mellitus without complications: Secondary | ICD-10-CM

## 2017-05-09 DIAGNOSIS — E860 Dehydration: Secondary | ICD-10-CM

## 2017-05-09 LAB — CBC WITH DIFFERENTIAL/PLATELET
Basophils Absolute: 0 10*3/uL (ref 0.0–0.1)
Basophils Relative: 1 %
EOS ABS: 0 10*3/uL (ref 0.0–0.7)
EOS PCT: 1 %
HCT: 34.4 % — ABNORMAL LOW (ref 36.0–46.0)
Hemoglobin: 10.8 g/dL — ABNORMAL LOW (ref 12.0–15.0)
LYMPHS ABS: 1.1 10*3/uL (ref 0.7–4.0)
LYMPHS PCT: 27 %
MCH: 28.4 pg (ref 26.0–34.0)
MCHC: 31.4 g/dL (ref 30.0–36.0)
MCV: 90.5 fL (ref 78.0–100.0)
MONO ABS: 0.4 10*3/uL (ref 0.1–1.0)
MONOS PCT: 10 %
Neutro Abs: 2.6 10*3/uL (ref 1.7–7.7)
Neutrophils Relative %: 61 %
PLATELETS: 187 10*3/uL (ref 150–400)
RBC: 3.8 MIL/uL — AB (ref 3.87–5.11)
RDW: 13.3 % (ref 11.5–15.5)
WBC: 4.2 10*3/uL (ref 4.0–10.5)

## 2017-05-09 LAB — URINALYSIS, ROUTINE W REFLEX MICROSCOPIC
Bilirubin Urine: NEGATIVE
Glucose, UA: >=500 mg/dL — AB
Ketones, ur: NEGATIVE mg/dL
Leukocytes, UA: NEGATIVE
Nitrite: NEGATIVE
PH: 5 (ref 5.0–8.0)
Protein, ur: 100 mg/dL — AB
SPECIFIC GRAVITY, URINE: 1.016 (ref 1.005–1.030)
SQUAMOUS EPITHELIAL / LPF: NONE SEEN

## 2017-05-09 LAB — COMPREHENSIVE METABOLIC PANEL
ALT: 13 U/L — AB (ref 14–54)
ANION GAP: 9 (ref 5–15)
AST: 14 U/L — ABNORMAL LOW (ref 15–41)
Albumin: 2.8 g/dL — ABNORMAL LOW (ref 3.5–5.0)
Alkaline Phosphatase: 126 U/L (ref 38–126)
BUN: 20 mg/dL (ref 6–20)
CHLORIDE: 100 mmol/L — AB (ref 101–111)
CO2: 19 mmol/L — AB (ref 22–32)
CREATININE: 1.44 mg/dL — AB (ref 0.44–1.00)
Calcium: 9 mg/dL (ref 8.9–10.3)
GFR, EST AFRICAN AMERICAN: 48 mL/min — AB (ref >60)
GFR, EST NON AFRICAN AMERICAN: 42 mL/min — AB (ref >60)
Glucose, Bld: 800 mg/dL (ref 65–99)
POTASSIUM: 3.8 mmol/L (ref 3.5–5.1)
SODIUM: 128 mmol/L — AB (ref 135–145)
Total Bilirubin: 0.7 mg/dL (ref 0.3–1.2)
Total Protein: 6.4 g/dL — ABNORMAL LOW (ref 6.5–8.1)

## 2017-05-09 LAB — BASIC METABOLIC PANEL
Anion gap: 9 (ref 5–15)
BUN: 22 mg/dL — AB (ref 6–20)
CHLORIDE: 102 mmol/L (ref 101–111)
CO2: 21 mmol/L — ABNORMAL LOW (ref 22–32)
Calcium: 8.8 mg/dL — ABNORMAL LOW (ref 8.9–10.3)
Creatinine, Ser: 1.39 mg/dL — ABNORMAL HIGH (ref 0.44–1.00)
GFR calc Af Amer: 50 mL/min — ABNORMAL LOW (ref >60)
GFR calc non Af Amer: 43 mL/min — ABNORMAL LOW (ref >60)
GLUCOSE: 502 mg/dL — AB (ref 65–99)
POTASSIUM: 3.7 mmol/L (ref 3.5–5.1)
SODIUM: 132 mmol/L — AB (ref 135–145)

## 2017-05-09 LAB — I-STAT VENOUS BLOOD GAS, ED
Acid-base deficit: 3 mmol/L — ABNORMAL HIGH (ref 0.0–2.0)
Bicarbonate: 22.3 mmol/L (ref 20.0–28.0)
O2 SAT: 71 %
PCO2 VEN: 41.8 mmHg — AB (ref 44.0–60.0)
PH VEN: 7.336 (ref 7.250–7.430)
PO2 VEN: 40 mmHg (ref 32.0–45.0)
TCO2: 24 mmol/L (ref 22–32)

## 2017-05-09 LAB — MAGNESIUM: Magnesium: 1.6 mg/dL — ABNORMAL LOW (ref 1.7–2.4)

## 2017-05-09 LAB — TSH: TSH: 7.045 u[IU]/mL — ABNORMAL HIGH (ref 0.350–4.500)

## 2017-05-09 LAB — GLUCOSE, CAPILLARY
GLUCOSE-CAPILLARY: 396 mg/dL — AB (ref 65–99)
Glucose-Capillary: 515 mg/dL (ref 65–99)

## 2017-05-09 LAB — CBG MONITORING, ED
GLUCOSE-CAPILLARY: 490 mg/dL — AB (ref 65–99)
Glucose-Capillary: >600 mg/dL (ref 65–99)
Glucose-Capillary: 546 mg/dL (ref 65–99)

## 2017-05-09 LAB — PHOSPHORUS: Phosphorus: 3 mg/dL (ref 2.5–4.6)

## 2017-05-09 LAB — MRSA PCR SCREENING: MRSA by PCR: NEGATIVE

## 2017-05-09 LAB — ETHANOL

## 2017-05-09 LAB — BRAIN NATRIURETIC PEPTIDE: B NATRIURETIC PEPTIDE 5: 282.1 pg/mL — AB (ref 0.0–100.0)

## 2017-05-09 MED ORDER — ACETAMINOPHEN 325 MG PO TABS: 650.0000 mg | ORAL_TABLET | Freq: Four times a day (QID) | ORAL | Status: DC | PRN

## 2017-05-09 MED ORDER — ONDANSETRON HCL 4 MG/2ML IJ SOLN: 4.0000 mg | Freq: Four times a day (QID) | INTRAMUSCULAR | Status: DC | PRN

## 2017-05-09 MED ORDER — INSULIN ASPART 100 UNIT/ML ~~LOC~~ SOLN: 10.0000 [IU] | Freq: Once | SUBCUTANEOUS | Status: DC

## 2017-05-09 MED ORDER — INSULIN ASPART 100 UNIT/ML ~~LOC~~ SOLN
10.0000 [IU] | Freq: Once | SUBCUTANEOUS | Status: AC
  Administered 2017-05-09: 10 [IU] via SUBCUTANEOUS

## 2017-05-09 MED ORDER — SODIUM CHLORIDE 0.9 % IV SOLN
Freq: Once | INTRAVENOUS | Status: AC
  Administered 2017-05-09: 100 mL/h via INTRAVENOUS

## 2017-05-09 MED ORDER — TACROLIMUS 0.5 MG PO CAPS
0.5000 mg | ORAL_CAPSULE | ORAL | Status: DC
  Administered 2017-05-10: 0.5 mg via ORAL
  Filled 2017-05-09: qty 1

## 2017-05-09 MED ORDER — LEVOTHYROXINE SODIUM 137 MCG PO TABS
137.0000 ug | ORAL_TABLET | Freq: Every day | ORAL | Status: DC
  Administered 2017-05-10: 137 ug via ORAL
  Filled 2017-05-09: qty 1

## 2017-05-09 MED ORDER — VITAMIN B-12 100 MCG PO TABS
100.0000 ug | ORAL_TABLET | Freq: Every day | ORAL | Status: DC
  Administered 2017-05-09 – 2017-05-10 (×2): 100 ug via ORAL
  Filled 2017-05-09 (×2): qty 1

## 2017-05-09 MED ORDER — CARVEDILOL 12.5 MG PO TABS
50.0000 mg | ORAL_TABLET | Freq: Two times a day (BID) | ORAL | Status: DC
  Administered 2017-05-09 – 2017-05-10 (×2): 50 mg via ORAL
  Filled 2017-05-09 (×3): qty 4

## 2017-05-09 MED ORDER — ACETAMINOPHEN 325 MG PO TABS
650.0000 mg | ORAL_TABLET | Freq: Four times a day (QID) | ORAL | Status: DC | PRN
  Administered 2017-05-09: 650 mg via ORAL
  Filled 2017-05-09: qty 2

## 2017-05-09 MED ORDER — CEFAZOLIN SODIUM-DEXTROSE 1-4 GM/50ML-% IV SOLN
1.0000 g | Freq: Once | INTRAVENOUS | Status: AC
  Administered 2017-05-09: 1 g via INTRAVENOUS
  Filled 2017-05-09: qty 50

## 2017-05-09 MED ORDER — MYCOPHENOLATE MOFETIL 250 MG PO CAPS
500.0000 mg | ORAL_CAPSULE | Freq: Two times a day (BID) | ORAL | Status: DC
  Administered 2017-05-09 – 2017-05-10 (×2): 500 mg via ORAL
  Filled 2017-05-09 (×2): qty 2

## 2017-05-09 MED ORDER — ACETAMINOPHEN 650 MG RE SUPP: 650.0000 mg | Freq: Four times a day (QID) | RECTAL | Status: DC | PRN

## 2017-05-09 MED ORDER — HEPARIN SODIUM (PORCINE) 5000 UNIT/ML IJ SOLN
5000.0000 [IU] | Freq: Three times a day (TID) | INTRAMUSCULAR | Status: DC
  Administered 2017-05-09 – 2017-05-10 (×2): 5000 [IU] via SUBCUTANEOUS
  Filled 2017-05-09 (×2): qty 1

## 2017-05-09 MED ORDER — TACROLIMUS 0.5 MG PO CAPS: 0.5000 mg | ORAL_CAPSULE | Freq: Two times a day (BID) | ORAL | Status: DC

## 2017-05-09 MED ORDER — ONDANSETRON HCL 4 MG PO TABS: 4.0000 mg | ORAL_TABLET | Freq: Four times a day (QID) | ORAL | Status: DC | PRN

## 2017-05-09 MED ORDER — TACROLIMUS 1 MG PO CAPS
1.0000 mg | ORAL_CAPSULE | Freq: Every day | ORAL | Status: DC
  Administered 2017-05-09: 1 mg via ORAL
  Filled 2017-05-09: qty 1

## 2017-05-09 MED ORDER — INSULIN ASPART 100 UNIT/ML ~~LOC~~ SOLN
0.0000 [IU] | Freq: Three times a day (TID) | SUBCUTANEOUS | Status: DC
  Administered 2017-05-10 (×2): 7 [IU] via SUBCUTANEOUS

## 2017-05-09 MED ORDER — DOXYCYCLINE HYCLATE 100 MG PO TABS
100.0000 mg | ORAL_TABLET | Freq: Two times a day (BID) | ORAL | Status: DC
  Administered 2017-05-09 – 2017-05-10 (×2): 100 mg via ORAL
  Filled 2017-05-09 (×2): qty 1

## 2017-05-09 MED ORDER — INSULIN ASPART 100 UNIT/ML ~~LOC~~ SOLN
0.0000 [IU] | Freq: Every day | SUBCUTANEOUS | Status: DC
  Administered 2017-05-09: 5 [IU] via SUBCUTANEOUS

## 2017-05-09 MED ORDER — INSULIN ASPART PROT & ASPART (70-30 MIX) 100 UNIT/ML ~~LOC~~ SUSP
20.0000 [IU] | Freq: Two times a day (BID) | SUBCUTANEOUS | Status: DC
  Administered 2017-05-09 – 2017-05-10 (×2): 20 [IU] via SUBCUTANEOUS
  Filled 2017-05-09: qty 10

## 2017-05-09 MED ORDER — TRAZODONE HCL 50 MG PO TABS
25.0000 mg | ORAL_TABLET | Freq: Every evening | ORAL | Status: DC | PRN
  Administered 2017-05-09: 25 mg via ORAL
  Filled 2017-05-09: qty 1

## 2017-05-09 MED ORDER — HYDRALAZINE HCL 50 MG PO TABS
25.0000 mg | ORAL_TABLET | Freq: Two times a day (BID) | ORAL | Status: DC
  Administered 2017-05-09 – 2017-05-10 (×2): 25 mg via ORAL
  Filled 2017-05-09 (×3): qty 1

## 2017-05-09 MED ORDER — IBUPROFEN 400 MG PO TABS
400.0000 mg | ORAL_TABLET | Freq: Four times a day (QID) | ORAL | Status: DC | PRN
  Administered 2017-05-10: 400 mg via ORAL
  Filled 2017-05-09: qty 1

## 2017-05-09 MED ORDER — INSULIN ASPART 100 UNIT/ML ~~LOC~~ SOLN
10.0000 [IU] | Freq: Once | SUBCUTANEOUS | Status: AC
  Administered 2017-05-09: 10 [IU] via INTRAVENOUS
  Filled 2017-05-09: qty 1

## 2017-05-09 MED ORDER — PREDNISONE 5 MG PO TABS
5.0000 mg | ORAL_TABLET | Freq: Every day | ORAL | Status: DC
  Administered 2017-05-09: 5 mg via ORAL
  Filled 2017-05-09: qty 1

## 2017-05-09 MED ORDER — AMLODIPINE BESYLATE 5 MG PO TABS
10.0000 mg | ORAL_TABLET | Freq: Every day | ORAL | Status: DC
  Administered 2017-05-09 – 2017-05-10 (×2): 10 mg via ORAL
  Filled 2017-05-09 (×2): qty 2

## 2017-05-09 MED ORDER — ASPIRIN EC 81 MG PO TBEC
81.0000 mg | DELAYED_RELEASE_TABLET | Freq: Every day | ORAL | Status: DC
  Administered 2017-05-09 – 2017-05-10 (×2): 81 mg via ORAL
  Filled 2017-05-09 (×2): qty 1

## 2017-05-09 NOTE — ED Provider Notes (Addendum)
[facility]-EMERGENCY DEPT Provider Note   CSN: [id number] Arrival date & time: 05/09/17  8295     History   Chief Complaint No chief complaint on file.   HPI [name] is a 51 y.o. female.  HPI   51 year old female with extensive past medical history disease including history of end-stage renal disease status post renal transplant, chronic, poorly controlled diabetes, who presents with hyperglycemia. The patient states that for the last 2-3 days, her blood sugars have gone from a baseline of 2 to 300s up to 4 to 500s. She states that she has had a lot on her mind recently and has been drinking wine coolers for the last 24 hours. She notes that her sugar has subsequently begun reading "high" and she has had associated worsening fatigue. She also endorses mild shortness of breath. She does also note that she has had increasing bilateral leg swelling, right greater than left. This is chronic for her but acutely worsen. She has had persistent erythema of right leg since her last admission but denies any recent changes. No fevers. No abdominal pain, nausea, or vomiting. She has reportedly been taking her insulin.  Past Medical History:  Diagnosis Date  . Arthritis    "elbows, knees, legs, back" (09/24/2015)  . CKD (chronic kidney disease)   . Daily headache   . Depression    "years ago"  . End stage renal disease (HCC)    right arm AV graft, resolved post transplant  . Hypertension   . Hypothyroid   . 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 Kidney disease 2011  . Pneumonia ~ 2007?  [name] Type II diabetes mellitus (HCC)    Insulin dependant    Patient Active Problem List   Diagnosis Date Noted  . Lower extremity cellulitis 03/23/2017  . Type II diabetes mellitus (HCC) 03/23/2017  . Anemia 01/26/2017  . Abnormal LFTs 01/26/2017  . DKA (diabetic ketoacidoses) (HCC) 08/12/2016  . Hypoglycemia   . Wound cellulitis   . CKD (chronic kidney disease) stage 3, GFR  30-59 ml/min 06/26/2016  . Cellulitis of right lower extremity   . QT prolongation 05/13/2016  . UTI (lower urinary tract infection)   . Hypomagnesemia   . Pressure ulcer 04/11/2016  . Acute encephalopathy 04/11/2016  . Altered mental status 04/10/2016  . Insulin dependent diabetes mellitus (HCC) 04/10/2016  . Diabetic ketoacidosis without coma associated with type 2 diabetes mellitus (HCC) 11/02/2015  . Uncontrolled type 1 diabetes mellitus with foot ulcer (HCC) 06/04/2015  . Foot ulcer, right (HCC)   . Diarrhea 06/02/2015  . Type 1 diabetes mellitus with diabetic foot ulcer (HCC) 06/02/2015  . Acute renal failure superimposed on stage 3 chronic kidney disease (HCC) 06/02/2015  . Diabetic foot infection (HCC) 06/01/2015  . Essential hypertension 06/01/2015  . GERD (gastroesophageal reflux disease) 06/01/2015  . Hypotension 03/22/2014  . H/O kidney transplant 08/04/2012  . Sepsis (HCC) 08/04/2012  . Hyperkalemia 09/22/2011  . Hyponatremia 09/22/2011  . Immunosuppression (HCC)   . Hypothyroidism     Past Surgical History:  Procedure Laterality Date  . AMPUTATION Left 05/11/2014   Procedure: AMPUTATION LEFT GREAT TOE;  Surgeon: Kathryne Hitch, MD;  Location: [facility];  Service: Orthopedics;  Laterality: Left;  . AV FISTULA PLACEMENT Right    forearm  . BACK SURGERY    . CATARACT EXTRACTION W/ INTRAOCULAR LENS  IMPLANT, BILATERAL Bilateral   . CESAREAN SECTION  07/1999  . DG AV DIALYSIS GRAFT  DECLOT OR    . INCISION AND DRAINAGE Right 06/02/2015   Procedure: INCISION AND DRAINAGE OF RIGHT 3rd RAY RESECTION;  Surgeon: Kathryne Hitch, MD;  Location: [facility] OR;  Service: Orthopedics;  Laterality: Right;  . 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 LUMBAR DISC SURGERY  2001  . NEPHRECTOMY TRANSPLANTED ORGAN    . TOE AMPUTATION Right    1,2 & 3rd toes.  . TUBAL LIGATION  07/1999    OB History    No data available       Home Medications    Prior to Admission  medications   Medication Sig Start Date End Date Taking? Authorizing Provider  acetaminophen (TYLENOL) 325 MG tablet Take 2 tablets (650 mg total) by mouth every 6 (six) hours as needed for mild pain, moderate pain, fever or headache. 05/15/16   Hongalgi, Maximino Greenland, MD  amLODipine (NORVASC) 10 MG tablet Take 1 tablet (10 mg total) by mouth daily. 08/14/16   Regalado, Jon Billings A, MD  aspirin EC 81 MG tablet Take 81 mg by mouth daily.    [provider]  carvedilol (COREG) 25 MG tablet Take 50 mg by mouth 2 (two) times daily. 02/26/17   [provider]  Continuous Blood Gluc Receiver (FREESTYLE LIBRE READER) DEVI 1 each by Does not apply route 4 (four) times daily. 04/29/17   Leatha Gilding, MD  Continuous Blood Gluc Sensor (FREESTYLE LIBRE SENSOR SYSTEM) MISC 1 sensor every 10 days 04/29/17   Leatha Gilding, MD  doxycycline (VIBRA-TABS) 100 MG tablet Take 1 tablet (100 mg total) by mouth every 12 (twelve) hours. 04/29/17   Leatha Gilding, MD  hydrALAZINE (APRESOLINE) 25 MG tablet Take 1 tablet (25 mg total) by mouth every 12 (twelve) hours. 04/29/17 05/29/17  Leatha Gilding, MD  insulin NPH-regular Human (NOVOLIN 70/30) (70-30) 100 UNIT/ML injection Inject 20 Units into the skin 2 (two) times daily with a meal. 04/14/17   Amin, Loura Halt, MD  mycophenolate (CELLCEPT) 250 MG capsule Take 500 mg by mouth 2 (two) times daily.     [provider]  nystatin (MYCOSTATIN/NYSTOP) powder Apply topically 4 (four) times daily. Patient not taking: Reported on 04/25/2017 03/29/17   Regalado, Jon Billings A, MD  predniSONE (DELTASONE) 5 MG tablet Take 5 mg by mouth at bedtime.  05/29/15   [provider]  SYNTHROID 137 MCG tablet Take 137 mcg by mouth daily. 04/10/17   [provider]  tacrolimus (PROGRAF) 0.5 MG capsule Take 0.5-1 mg by mouth 2 (two) times daily. Take 0.5mg  by mouth in the morning and 1mg  by mouth at bedtime    [provider]  vitamin B-12  (CYANOCOBALAMIN) 100 MCG tablet Take 1 tablet (100 mcg total) by mouth daily. 03/29/17 03/29/18  Regalado, Prentiss Bells, MD    Family History Family History  Problem Relation Age of Onset  . Emphysema Mother   . Throat cancer Mother   . COPD Mother   . Cancer Mother   . Emphysema Father   . COPD Father   . Stroke Father   . ADD / ADHD Son     Social History Social History  Substance Use Topics  . Smoking status: Former Smoker    Packs/day: 1.00    Years: 11.00    Types: Cigarettes    Quit date: 09/21/1998  . Smokeless tobacco: Never Used  . Alcohol use No     Allergies   Ace inhibitors; Adhesive [tape]; and Lisinopril  Review of Systems Review of Systems  Constitutional: Positive for fatigue. Negative for chills and fever.  HENT: Negative for congestion, rhinorrhea and sore throat.   Eyes: Negative for visual disturbance.  Respiratory: Negative for cough, shortness of breath and wheezing.   Cardiovascular: Positive for leg swelling. Negative for chest pain.  Gastrointestinal: Negative for abdominal pain, diarrhea, nausea and vomiting.  Endocrine: Positive for polyuria.  Genitourinary: Negative for dysuria, flank pain, vaginal bleeding and vaginal discharge.  Musculoskeletal: Negative for neck pain.  Skin: Positive for rash.  Allergic/Immunologic: Negative for immunocompromised state.  Neurological: Positive for weakness. Negative for syncope and headaches.  Hematological: Does not bruise/bleed easily.  All other systems reviewed and are negative.    Physical Exam Updated Vital Signs BP (!) 154/92   Pulse 80   Temp 97.8 F (36.6 C) (Oral)   Resp 18   LMP 11/19/2011   SpO2 100%   Physical Exam  Constitutional: She is oriented to person, place, and time. She appears well-developed and well-nourished. No distress.  HENT:  Head: Normocephalic and atraumatic.  Eyes: Conjunctivae are normal.  Neck: Neck supple.  Cardiovascular: Normal rate, regular rhythm and  normal heart sounds.  Exam reveals no friction rub.   No murmur heard. Pulmonary/Chest: Effort normal and breath sounds normal. No respiratory distress. She has no wheezes. She has no rales.  Abdominal: She exhibits no distension.  Musculoskeletal: She exhibits edema.  Marcated, 3+ bilateral pitting edema. Mild erythema and chronic skin changes of right greater than left leg.  Neurological: She is alert and oriented to person, place, and time. She exhibits normal muscle tone.  Skin: Skin is warm. Capillary refill takes less than 2 seconds.  Psychiatric: She has a normal mood and affect.  Nursing note and vitals reviewed.    ED Treatments / Results  Labs (all labs ordered are listed, but only abnormal results are displayed) Labs Reviewed  CBC WITH DIFFERENTIAL/PLATELET - Abnormal; Notable for the following:       Result Value   RBC 3.80 (*)    Hemoglobin 10.8 (*)    HCT 34.4 (*)    All other components within normal limits  COMPREHENSIVE METABOLIC PANEL - Abnormal; Notable for the following:    Sodium 128 (*)    Chloride 100 (*)    CO2 19 (*)    Glucose, Bld 800 (*)    Creatinine, Ser 1.44 (*)    Total Protein 6.4 (*)    Albumin 2.8 (*)    AST 14 (*)    ALT 13 (*)    GFR calc non Af Amer 42 (*)    GFR calc Af Amer 48 (*)    All other components within normal limits  URINALYSIS, ROUTINE W REFLEX MICROSCOPIC - Abnormal; Notable for the following:    Color, Urine STRAW (*)    Glucose, UA >=500 (*)    Hgb urine dipstick SMALL (*)    Protein, ur 100 (*)    Bacteria, UA RARE (*)    All other components within normal limits  BRAIN NATRIURETIC PEPTIDE - Abnormal; Notable for the following:    B Natriuretic Peptide 282.1 (*)    All other components within normal limits  CBG MONITORING, ED - Abnormal; Notable for the following:    Glucose-Capillary >600 (*)    All other components within normal limits  I-STAT VENOUS BLOOD GAS, ED - Abnormal; Notable for the following:    pCO2,  Ven 41.8 (*)    Acid-base  deficit 3.0 (*)    All other components within normal limits  ETHANOL    EKG  EKG Interpretation  Date/Time:  Sunday May 09 2017 08:51:39 EDT Ventricular Rate:  83 PR Interval:    QRS Duration: 137 QT Interval:  422 QTC Calculation: 496 R Axis:   159 Text Interpretation:  Sinus rhythm Right bundle branch block No significant change since last tracing Confirmed by Reshanda Lewey ([id number]) on 05/10/2017 2:56:41 PM       Radiology Dg Chest 2 View  Result Date: 05/09/2017 CLINICAL DATA:  Cough.  Left foot second toe swelling. EXAM: CHEST  2 VIEW COMPARISON:  One-view chest x-ray 04/25/2017 FINDINGS: The heart size and mediastinal contours are within normal limits. Both lungs are clear. The visualized skeletal structures are unremarkable. IMPRESSION: Negative two view chest x-ray Electronically Signed   By: Christopher  Mattern M.D.   On: 05/09/2017 09:41   Dg Foot Complete Left  Result Date: 05/09/2017 CLINICAL DATA:  Swelling, second toe.  Diabetes. EXAM: LEFT FOOT - COMPLETE 3+ VIEW COMPARISON:  05/03/2014 FINDINGS: Interval amputation across the proximal phalanx left great toe. Intra-articular fracture involving the head of the proximal phalanx left second toe, with lateral displacement of a 5 mm sub chondral fragment. There is sclerosis in the head of the proximal phalanx. No definite cortical destruction to suggest osteomyelitis. Mild diffuse osteopenia. Chronic flattening of the head third metacarpal. Diffuse vascular calcifications. Small calcaneal spurs. No radiodense foreign body or subcutaneous gas. IMPRESSION: 1. Interval displaced intra-articular fracture, head proximal phalanx left second toe since 05/03/2014. Regional sclerosis suggests this may be nonacute. 2. No radiographic evidence of osteomyelitis. 3. Additional chronic and postop changes as above. Electronically Signed   By: D  Hassell M.D.   On: 05/09/2017 09:43    Procedures Procedures  (including critical care time)  Medications Ordered in ED Medications  insulin aspart (novoLOG) injection 10 Units (not administered)  0.9 %  sodium chloride infusion (not administered)  ceFAZolin (ANCEF) IVPB 1 g/50 mL premix (not administered)     Initial Impression / Assessment and Plan / ED Course  I have reviewed the triage vital signs and the nursing notes.  Pertinent labs & imaging results that were available during my care of the patient were reviewed by me and considered in my medical decision making (see chart for details).     51  yo F with h/o renal transplant, chronic poorly controlled DM here with hyperglycemia, leg redness/warmth. Exam is as above. Lab work shows marked hyperglycemia with Glu 800, CO2 19 but normal AG, no ketones in urine. Suspect severe hyperglycemia 2/2 combination of non-adherence, possible early, mild cellulitis though pt has known h/o chronic stasis changes of legs. I reviewed prior records - pt has recurrent DKA with BG less than 800 and has required admission 1-2 days after every ED visit for hyperglycemia in which she was sent home. Given her significant hyperglycemia and low bicarb with possible early cellulitis, in immune suppressed pt, will admit for IVF, insulin, and monitoring. Ancef given for non-purulent cellulitis.  This note was prepared with assistance of Conservation officer, historic buildingsDragon voice recognition software. Occasional wrong-word or sound-a-like substitutions may have occurred due to the inherent limitations of voice recognition software.   Final Clinical Impressions(s) / ED Diagnoses   Final diagnoses:  Hyperglycemia  Leg swelling  Dehydration     Shaune PollackIsaacs, Tyreak Reagle, MD 05/10/17 1456    Shaune PollackIsaacs, Tenise Stetler, MD 05/10/17 [phone]

## 2017-05-09 NOTE — Progress Notes (Signed)
Reported to MD [facility] [id number]. Awaiting orders  [name] ReevesNatalie N [facility], RN

## 2017-05-09 NOTE — ED Notes (Signed)
Meal ordered

## 2017-05-09 NOTE — Progress Notes (Signed)
New Admission Note:   Arrival Method:  strethcer from ED Mental Orientation: alert and oriented x4  Telemetry:none  Assessment: Completed Skin: see flow sheet  IV: LFA  Pain:denies  Tubes: Safety Measures: Safety Fall Prevention Plan has been given, discussed and signed Admission: Completed [facility] Orientation: Patient has been orientated to the room, unit and staff.  Family: none at bedside   Orders have been reviewed and implemented. Will continue to monitor the patient. Call light has been placed within reach and bed alarm has been activated.   Nelma RothmanNatalie Pauletta Pickney, RN [facility]  Phone number: [phone]

## 2017-05-09 NOTE — H&P (Signed)
History and Physical    [name] VOP:[id number] DOB: 1966/03/25 DOA: 05/09/2017  PCP: Patient, [name] Patient coming from: home  Chief Complaint: uncontrolled blood sugar  HPI: [name] is a 51 y.o. female with medical history significant for type 2 diabetes, history of kidney transplant, osteoarthritis, chronic kidney disease, hypertension, hypothyroidism, immunosuppression due to transplant medications, obesity, frequent admissions for poor glycemic control presents to the emergency Department chief complaint elevated blood sugar. Currently no and ion gap is appear dehydrated with a hyponatremia. [facility] asked to admit  Information is obtained from the chart and the patient. She reports that her blood sugar has "been running high the last couple of days". She verbalizes some anxiety over personal/family situation at home and admits to drinking several wine coolers over the last day and half. Associated symptoms include headache dizziness with position change. She states that yesterday she was bending over and she fell over onto her left side. She reports hearing her head on the floor as well as her left hand. He denies nausea vomiting diarrhea. She denies dysuria hematuria frequency or urgency. She denies chest pain but does endorse some shortness of breath. She denies fever chills cough. She does report some mild increase in her chronic bilateral leg edema/erythema. She reports compliance with all her medications including her insulin.    ED Course: In emergency department she's afebrile hemodynamically stable and not hypoxic. Serum glucose 800 sodium 128. She is provided with 10 units of insulin intravenously. Her blood sugar 2 hours later was 546. An ion gap 9  Review of Systems: As per HPI otherwise all other systems reviewed and are negative.   Ambulatory Status: Ambulates independently without a cane or walker. She reports steady gait  Past Medical History:    Diagnosis Date  . Arthritis    "elbows, knees, legs, back" (09/24/2015)  . CKD (chronic kidney disease)   . Daily headache   . Depression    "years ago"  . End stage renal disease (HCC)    right arm AV graft, resolved post transplant  . Hypertension   . Hypothyroid   . Immunosuppression ([place])    secondary to renal transplant  . Kidney disease 2011  . Pneumonia ~ 2007?  [name] Type II diabetes mellitus (HCC)    Insulin dependant    Past Surgical History:  Procedure Laterality Date  . AMPUTATION Left 05/11/2014   Procedure: AMPUTATION LEFT GREAT TOE;  Surgeon: Mcarthur Rossetti, MD;  Location: [facility];  Service: Orthopedics;  Laterality: Left;  . AV FISTULA PLACEMENT Right    forearm  . BACK SURGERY    . CATARACT EXTRACTION W/ INTRAOCULAR LENS  IMPLANT, BILATERAL Bilateral   . CESAREAN SECTION  07/1999  . DG AV DIALYSIS GRAFT DECLOT OR    . INCISION AND DRAINAGE Right 06/02/2015   Procedure: INCISION AND DRAINAGE OF RIGHT 3rd RAY RESECTION;  Surgeon: Mcarthur Rossetti, MD;  Location: [facility];  Service: Orthopedics;  Laterality: Right;  . 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 [place] SURGERY  2001  . NEPHRECTOMY TRANSPLANTED ORGAN    . TOE AMPUTATION Right    1,2 & 3rd toes.  . TUBAL LIGATION  07/1999    Social History   Social History  . Marital status: Married    Spouse name: N/A  . Number of children: N/A  . Years of education: N/A   Occupational History  . disabled    Social History  Main Topics  . Smoking status: Former Smoker    Packs/day: 1.00    Years: 11.00    Types: Cigarettes    Quit date: 09/21/1998  . Smokeless tobacco: Never Used  . Alcohol use No  . Drug use: No  . Sexual activity: No   Other Topics Concern  . Not on file   Social History Narrative  . No narrative on file    Allergies  Allergen Reactions  . Ace Inhibitors Cough  . Adhesive [Tape] Itching and Other (See Comments)    Please use paper tape  . Lisinopril Cough     Family History  Problem Relation Age of Onset  . Emphysema Mother   . Throat cancer Mother   . COPD Mother   . Cancer Mother   . Emphysema Father   . COPD Father   . Stroke Father   . ADD / ADHD Son     Prior to Admission medications   Medication Sig Start Date End Date Taking? Authorizing Provider  acetaminophen (TYLENOL) 325 MG tablet Take 2 tablets (650 mg total) by mouth every 6 (six) hours as needed for mild pain, moderate pain, fever or headache. 05/15/16  Yes Hongalgi, Lenis Dickinson, MD  amLODipine (NORVASC) 10 MG tablet Take 1 tablet (10 mg total) by mouth daily. 08/14/16  Yes Regalado, Belkys A, MD  aspirin EC 81 MG tablet Take 81 mg by mouth daily.   Yes [provider]  carvedilol (COREG) 25 MG tablet Take 50 mg by mouth 2 (two) times daily. 02/26/17  Yes [provider]  Continuous Blood Gluc Receiver (FREESTYLE LIBRE READER) DEVI 1 each by Does not apply route 4 (four) times daily. 04/29/17  Yes Gherghe, Vella Redhead, MD  Continuous Blood Gluc Sensor (FREESTYLE LIBRE SENSOR SYSTEM) MISC 1 sensor every 10 days 04/29/17  Yes Gherghe, Vella Redhead, MD  doxycycline (VIBRA-TABS) 100 MG tablet Take 1 tablet (100 mg total) by mouth every 12 (twelve) hours. 04/29/17  Yes Caren Griffins, MD  hydrALAZINE (APRESOLINE) 25 MG tablet Take 1 tablet (25 mg total) by mouth every 12 (twelve) hours. 04/29/17 05/29/17 Yes Gherghe, Vella Redhead, MD  insulin NPH-regular Human (NOVOLIN 70/30) (70-30) 100 UNIT/ML injection Inject 20 Units into the skin 2 (two) times daily with a meal. 04/14/17  Yes Amin, Ankit Chirag, MD  mycophenolate (CELLCEPT) 250 MG capsule Take 500 mg by mouth 2 (two) times daily.    Yes [provider]  predniSONE (DELTASONE) 5 MG tablet Take 5 mg by mouth at bedtime.  05/29/15  Yes [provider]  SYNTHROID 137 MCG tablet Take 137 mcg by mouth daily. 04/10/17  Yes [provider]  tacrolimus (PROGRAF) 0.5 MG capsule Take 0.5-1 mg by mouth 2 (two) times  daily. Take 0.74m by mouth in the morning and 166mby mouth at bedtime   Yes [provider]  vitamin B-12 (CYANOCOBALAMIN) 100 MCG tablet Take 1 tablet (100 mcg total) by mouth daily. Patient not taking: Reported on 05/09/2017 03/29/17 03/29/18  ReElmarie ShileyMD    Physical Exam: Vitals:   05/09/17 1330 05/09/17 1345 05/09/17 1400 05/09/17 1415  BP: (!) 143/64 (!) 141/61 (!) 142/60 (!) 141/65  Pulse: 76 77 78 74  Resp: 18 17 (!) 22   Temp:      TempSrc:      SpO2: 98% 96% 99% 97%     General:  Appears calm and comfortable Sitting up in bed, in no acute distress Eyes:  PERRL, EOMI, normal lids, iris ENT:  grossly normal hearing, lips & tongue, mucous membranes of her mouth are pink but quite dry, very poor dentition Neck:  no LAD, masses or thyromegaly Cardiovascular:  RRR, no m/r/g. Trace -1+ LE edema. Chronic venous stasis changes under mild erythema to telemetry on the right Respiratory:  CTA bilaterally, no w/r/r. Normal respiratory effort. Abdomen:  soft, ntnd, obese positive bowel sounds no guarding or rebounding Skin:  no rash or induration seen on limited exam Musculoskeletal:  grossly normal tone BUE/BLE, good ROM, no bony abnormality Psychiatric:  grossly normal mood and affect, speech fluent and appropriate, AOx3 Neurologic:  CN 2-12 grossly intact, moves all extremities in coordinated fashion, sensation intact  Labs on Admission: I have personally reviewed following labs and imaging studies  CBC:  Recent Labs Lab 05/09/17 0853  WBC 4.2  NEUTROABS 2.6  HGB 10.8*  HCT 34.4*  MCV 90.5  PLT 981   Basic Metabolic Panel:  Recent Labs Lab 05/09/17 0853  NA 128*  K 3.8  CL 100*  CO2 19*  GLUCOSE 800*  BUN 20  CREATININE 1.44*  CALCIUM 9.0   GFR: Estimated Creatinine Clearance: 53.9 mL/min (A) (by C-G formula based on SCr of 1.44 mg/dL (H)). Liver Function Tests:  Recent Labs Lab 05/09/17 0853  AST 14*  ALT 13*  ALKPHOS 126  BILITOT 0.7   PROT 6.4*  ALBUMIN 2.8*   No results for input(s): LIPASE, AMYLASE in the last 168 hours. No results for input(s): AMMONIA in the last 168 hours. Coagulation Profile: No results for input(s): INR, PROTIME in the last 168 hours. Cardiac Enzymes: No results for input(s): CKTOTAL, CKMB, CKMBINDEX, TROPONINI in the last 168 hours. BNP (last 3 results) No results for input(s): PROBNP in the last 8760 hours. HbA1C: No results for input(s): HGBA1C in the last 72 hours. CBG:  Recent Labs Lab 05/09/17 0804 05/09/17 1153  GLUCAP >600* 546*   Lipid Profile: No results for input(s): CHOL, HDL, LDLCALC, TRIG, CHOLHDL, LDLDIRECT in the last 72 hours. Thyroid Function Tests: No results for input(s): TSH, T4TOTAL, FREET4, T3FREE, THYROIDAB in the last 72 hours. Anemia Panel: No results for input(s): VITAMINB12, FOLATE, FERRITIN, TIBC, IRON, RETICCTPCT in the last 72 hours. Urine analysis:    Component Value Date/Time   COLORURINE STRAW (A) 05/09/2017 0920   APPEARANCEUR CLEAR 05/09/2017 0920   LABSPEC 1.016 05/09/2017 0920   PHURINE 5.0 05/09/2017 0920   GLUCOSEU >=500 (A) 05/09/2017 0920   HGBUR SMALL (A) 05/09/2017 0920   BILIRUBINUR NEGATIVE 05/09/2017 0920   KETONESUR NEGATIVE 05/09/2017 0920   PROTEINUR 100 (A) 05/09/2017 0920   UROBILINOGEN 0.2 03/02/2015 1153   NITRITE NEGATIVE 05/09/2017 0920   LEUKOCYTESUR NEGATIVE 05/09/2017 0920    Creatinine Clearance: Estimated Creatinine Clearance: 53.9 mL/min (A) (by C-G formula based on SCr of 1.44 mg/dL (H)).  Sepsis Labs: _0 (procalcitonin:4,lacticidven:4) )No results found for this or any previous visit (from the past 240 hour(s)).   Radiological Exams on Admission: Dg Chest 2 View  Result Date: 05/09/2017 CLINICAL DATA:  Cough.  Left foot second toe swelling. EXAM: CHEST  2 VIEW COMPARISON:  One-view chest x-ray 04/25/2017 FINDINGS: The heart size and mediastinal contours are within normal limits. Both lungs are  clear. The visualized skeletal structures are unremarkable. IMPRESSION: Negative two view chest x-ray Electronically Signed   By: San Morelle M.D.   On: 05/09/2017 09:41   Ct Head Wo Contrast  Result Date: 05/09/2017 CLINICAL DATA:  Pt has  hx of falling Fell last night hit rt side of head No marks but has a headache EXAM: CT HEAD WITHOUT CONTRAST TECHNIQUE: Contiguous axial images were obtained from the base of the skull through the vertex without intravenous contrast. COMPARISON:  05/13/2016 FINDINGS: Brain: Mild parenchymal atrophy. Patchy areas of hypoattenuation in periventricular white matter bilaterally. Negative for acute intracranial hemorrhage, mass lesion, acute infarction, midline shift, or mass-effect. Acute infarct may be inapparent on noncontrast CT. Ventricles and sulci symmetric. Vascular: Atherosclerotic and physiologic intracranial calcifications. Skull: Normal. Negative for fracture or focal lesion. Sinuses/Orbits: Opacification of bilateral maxillary, left ethmoid, and left sphenoid sinuses . Partial opacification of right ethmoid air cells. Orbits unremarkable. Other: None. IMPRESSION: 1. Negative for bleed or other acute intracranial process. 2. Atrophy and nonspecific white matter changes. 3. Pansinusitis Electronically Signed   By: Lucrezia Europe M.D.   On: 05/09/2017 11:11   Dg Foot Complete Left  Result Date: 05/09/2017 CLINICAL DATA:  Swelling, second toe.  Diabetes. EXAM: LEFT FOOT - COMPLETE 3+ VIEW COMPARISON:  05/03/2014 FINDINGS: Interval amputation across the proximal phalanx left great toe. Intra-articular fracture involving the head of the proximal phalanx left second toe, with lateral displacement of a 5 mm sub chondral fragment. There is sclerosis in the head of the proximal phalanx. No definite cortical destruction to suggest osteomyelitis. Mild diffuse osteopenia. Chronic flattening of the head third metacarpal. Diffuse vascular calcifications. Small calcaneal spurs.  No radiodense foreign body or subcutaneous gas. IMPRESSION: 1. Interval displaced intra-articular fracture, head proximal phalanx left second toe since 05/03/2014. Regional sclerosis suggests this may be nonacute. 2. No radiographic evidence of osteomyelitis. 3. Additional chronic and postop changes as above. Electronically Signed   By: Lucrezia Europe M.D.   On: 05/09/2017 09:43    EKG: Independently reviewed.Sinus rhythm Right bundle branch block  Assessment/Plan Principal Problem:   Hyperglycemia Active Problems:   Hypothyroidism   Hyponatremia   H/O kidney transplant   Essential hypertension   GERD (gastroesophageal reflux disease)   Insulin dependent diabetes mellitus (HCC)   Hypomagnesemia   CKD (chronic kidney disease) stage 3, GFR 30-59 ml/min   Lower extremity cellulitis   #1. Diabetes. Uncontrolled. Patient with a history of noncompliance with medicine and diet. Chart review indicates frequent admissions uncontrolled diabetes. She does have a mild worsening erythema/swelling of lower extremities otherwise no signs of infection. Glucose 800. A1c last month 9.4. She is provided with 10 units insulin intravenously. -Admit to MedSurg -Obtain follow-up be met -Continue home regimen of 7030 -Sliding scale for optimal control  #2. Lower extremity cellulitis. Chart review indicates patient having mild symptoms for over a month. Home medications include doxycycline. She is afebrile hemodynamically stable and nontoxic appearing -Continue doxycycline -Monitor  #3. Hyponatremia. Likely related to #1. -Gentle IV fluids -Improved glycemic control -Recheck in the morning  #4. Hypertension. Fair control in the emergency room. Home medications include amlodipine, Coreg, hydralazine -Continue home medications -Monitor closely  #4. Chronic kidney disease stage III status post transplant on right. Creatinine 1.4 on admission. This appears to be close to her baseline. She does appear  dehydrated. -Gentle IV fluids -Continue home meds -Monitor urine output -Hold nephrotoxins  #5. Hypothyroidism. TSH 4.8 in July 2018 -Continue home meds -Outpatient follow-up  #6. Kidney transplant. -Continue home meds -See #4   DVT prophylaxis: scd  Code Status: full  Family Communication: none present  Disposition Plan: home  Consults called: none  Admission status: obs    Radene Gunning MD [facility]  If 7PM-7AM, please contact night-coverage [email or web address] Password [email or web address]  05/09/2017, 2:29 PM

## 2017-05-09 NOTE — ED Triage Notes (Signed)
Patient complains of blood sugar running high since last pm. States that it became higher after drinking several wine coolers. Patient also reports fall after bending over. Reports hitting head on floor and left hand finger pain . Edema and old wounds noted to bilateral lower legs

## 2017-05-10 ENCOUNTER — Observation Stay (HOSPITAL_COMMUNITY): Payer: Medicare Other

## 2017-05-10 DIAGNOSIS — R739 Hyperglycemia, unspecified: Secondary | ICD-10-CM | POA: Diagnosis not present

## 2017-05-10 DIAGNOSIS — W19XXXS Unspecified fall, sequela: Secondary | ICD-10-CM | POA: Diagnosis not present

## 2017-05-10 DIAGNOSIS — S6292XA Unspecified fracture of left wrist and hand, initial encounter for closed fracture: Secondary | ICD-10-CM | POA: Diagnosis not present

## 2017-05-10 DIAGNOSIS — Z794 Long term (current) use of insulin: Secondary | ICD-10-CM | POA: Diagnosis not present

## 2017-05-10 DIAGNOSIS — E119 Type 2 diabetes mellitus without complications: Secondary | ICD-10-CM

## 2017-05-10 DIAGNOSIS — N183 Chronic kidney disease, stage 3 (moderate): Secondary | ICD-10-CM

## 2017-05-10 LAB — BASIC METABOLIC PANEL
Anion gap: 6 (ref 5–15)
BUN: 18 mg/dL (ref 6–20)
CALCIUM: 9.4 mg/dL (ref 8.9–10.3)
CO2: 23 mmol/L (ref 22–32)
Chloride: 110 mmol/L (ref 101–111)
Creatinine, Ser: 1.39 mg/dL — ABNORMAL HIGH (ref 0.44–1.00)
GFR calc Af Amer: 50 mL/min — ABNORMAL LOW (ref >60)
GFR, EST NON AFRICAN AMERICAN: 43 mL/min — AB (ref >60)
GLUCOSE: 41 mg/dL — AB (ref 65–99)
POTASSIUM: 3.5 mmol/L (ref 3.5–5.1)
SODIUM: 139 mmol/L (ref 135–145)

## 2017-05-10 LAB — CBC
HCT: 34.4 % — ABNORMAL LOW (ref 36.0–46.0)
Hemoglobin: 10.9 g/dL — ABNORMAL LOW (ref 12.0–15.0)
MCH: 27.7 pg (ref 26.0–34.0)
MCHC: 31.7 g/dL (ref 30.0–36.0)
MCV: 87.3 fL (ref 78.0–100.0)
Platelets: 224 10*3/uL (ref 150–400)
RBC: 3.94 MIL/uL (ref 3.87–5.11)
RDW: 13.4 % (ref 11.5–15.5)
WBC: 4.6 10*3/uL (ref 4.0–10.5)

## 2017-05-10 LAB — GLUCOSE, CAPILLARY
GLUCOSE-CAPILLARY: 58 mg/dL — AB (ref 65–99)
GLUCOSE-CAPILLARY: 107 mg/dL — AB (ref 65–99)
GLUCOSE-CAPILLARY: 113 mg/dL — AB (ref 65–99)
Glucose-Capillary: 31 mg/dL — CL (ref 65–99)
Glucose-Capillary: 321 mg/dL — ABNORMAL HIGH (ref 65–99)
Glucose-Capillary: 310 mg/dL — ABNORMAL HIGH (ref 65–99)

## 2017-05-10 NOTE — Discharge Summary (Signed)
Physician Discharge Summary  SHRUTHI NORTHRUP ZOX:[id number] DOB: [birth year] DOA: 05/09/2017  PCP: Patient, [name]  Admit date: 05/09/2017 Discharge date: 05/10/2017   Recommendations for Outpatient Follow-Up:   1. Outpatient follow up with Dr. Betha Loa for hand fracture-- splint placed before patient left 2. Needs close outpatient follow up for labile blood sugars-- education provided 3. Alcohol avoidance 4. Strict dietary compliance 5. TSH follow up/free t4   Discharge Diagnosis:   Principal Problem:   Hyperglycemia Active Problems:   Hypothyroidism   Hyponatremia   H/O kidney transplant   Essential hypertension   GERD (gastroesophageal reflux disease)   Insulin dependent diabetes mellitus (HCC)   Hypomagnesemia   CKD (chronic kidney disease) stage 3, GFR 30-59 ml/min   Lower extremity cellulitis   Discharge disposition:  Home  Discharge Condition: Improved.  Diet recommendation: Carbohydrate-modified  Wound care: None.   History of Present Illness:   CHEYANNA STRICK is a 51 y.o. female with a Past Medical History significant for kidney transplant, hypertension, hypothyroidism who presents with high blood sugar   Hospital Course by Problem:   Diabetes. Uncontrolled. -noncompliance with medicine and diet.  -Continue home regimen of 7030 -needs close outpatient follow up -have attempted to place patient in transitional clinic -discussed good eating habits with patient  Hyponatremia.  -improved with IVF  Hand fracture -from fall when intoxicated -to see Dr. Merlyn Lot as outpatient -ulnar splint placed  Hypertension.  -resume home meds  Chronic kidney disease stage III status post transplant on right. Creatinine 1.4 on admission. This appears to be close to her baseline. She does appear dehydrated. -follow up with nephrology  Hypothyroidism. TSH 4.8 in July 2018 -Continue home meds -Outpatient follow-up   Kidney transplant. -Continue home  meds      Medical Consultants:    Hand- Kuzma   Discharge Exam:   Vitals:   05/10/17 0531 05/10/17 0935  BP: (!) 108/39   Pulse: 71 78  Resp: 18 17  Temp: 98 F (36.7 C) 98 F (36.7 C)  SpO2: 97% 98%   Vitals:   05/09/17 1700 05/09/17 2047 05/10/17 0531 05/10/17 0935  BP:  126/80 (!) 108/39   Pulse:  76 71 78  Resp:  18 18 17   Temp:  97.6 F (36.4 C) 98 F (36.7 C) 98 F (36.7 C)  TempSrc:  Oral Oral Oral  SpO2:  99% 97% 98%  Weight:  84.2 kg (185 lb 9.6 oz)    Height: 5\' 8"  (1.727 m)       Gen:  NAD    The results of significant diagnostics from this hospitalization (including imaging, microbiology, ancillary and laboratory) are listed below for reference.     Procedures and Diagnostic Studies:   Dg Chest 2 View  Result Date: 05/09/2017 CLINICAL DATA:  Cough.  Left foot second toe swelling. EXAM: CHEST  2 VIEW COMPARISON:  One-view chest x-ray 04/25/2017 FINDINGS: The heart size and mediastinal contours are within normal limits. Both lungs are clear. The visualized skeletal structures are unremarkable. IMPRESSION: Negative two view chest x-ray Electronically Signed   By: Marin Roberts M.D.   On: 05/09/2017 09:41   Ct Head Wo Contrast  Result Date: 05/09/2017 CLINICAL DATA:  Pt has hx of falling Fell last night hit rt side of head No marks but has a headache EXAM: CT HEAD WITHOUT CONTRAST TECHNIQUE: Contiguous axial images were obtained from the base of the skull through the vertex without intravenous contrast. COMPARISON:  05/13/2016 FINDINGS: Brain: Mild parenchymal atrophy. Patchy areas of hypoattenuation in periventricular white matter bilaterally. Negative for acute intracranial hemorrhage, mass lesion, acute infarction, midline shift, or mass-effect. Acute infarct may be inapparent on noncontrast CT. Ventricles and sulci symmetric. Vascular: Atherosclerotic and physiologic intracranial calcifications. Skull: Normal. Negative for fracture or focal  lesion. Sinuses/Orbits: Opacification of bilateral maxillary, left ethmoid, and left sphenoid sinuses . Partial opacification of right ethmoid air cells. Orbits unremarkable. Other: None. IMPRESSION: 1. Negative for bleed or other acute intracranial process. 2. Atrophy and nonspecific white matter changes. 3. Pansinusitis Electronically Signed   By: Corlis Leak M.D.   On: 05/09/2017 11:11   Dg Hand Complete Left  Result Date: 05/10/2017 CLINICAL DATA:  Fall.  Pain in hand. EXAM: LEFT HAND - COMPLETE 3+ VIEW COMPARISON:  None. FINDINGS: Oblique fracture through the shaft of the small finger metacarpal, 4 mm displacement. Mildly spiral orientation. Diffuse atherosclerotic calcification as can sometimes be encountered in the setting of diabetes. IMPRESSION: 1. Moderately displaced oblique fracture through the shaft of the small finger metacarpal. 2. Diffuse atherosclerotic calcification. Electronically Signed   By: Gaylyn Rong M.D.   On: 05/10/2017 13:27   Dg Foot Complete Left  Result Date: 05/09/2017 CLINICAL DATA:  Swelling, second toe.  Diabetes. EXAM: LEFT FOOT - COMPLETE 3+ VIEW COMPARISON:  05/03/2014 FINDINGS: Interval amputation across the proximal phalanx left great toe. Intra-articular fracture involving the head of the proximal phalanx left second toe, with lateral displacement of a 5 mm sub chondral fragment. There is sclerosis in the head of the proximal phalanx. No definite cortical destruction to suggest osteomyelitis. Mild diffuse osteopenia. Chronic flattening of the head third metacarpal. Diffuse vascular calcifications. Small calcaneal spurs. No radiodense foreign body or subcutaneous gas. IMPRESSION: 1. Interval displaced intra-articular fracture, head proximal phalanx left second toe since 05/03/2014. Regional sclerosis suggests this may be nonacute. 2. No radiographic evidence of osteomyelitis. 3. Additional chronic and postop changes as above. Electronically Signed   By: Corlis Leak  M.D.   On: 05/09/2017 09:43     Labs:   Basic Metabolic Panel:  Recent Labs Lab 05/09/17 0853 05/09/17 1444 05/10/17 0244  NA 128* 132* 139  K 3.8 3.7 3.5  CL 100* 102 110  CO2 19* 21* 23  GLUCOSE 800* 502* 41*  BUN 20 22* 18  CREATININE 1.44* 1.39* 1.39*  CALCIUM 9.0 8.8* 9.4  MG  --  1.6*  --   PHOS  --  3.0  --    GFR Estimated Creatinine Clearance: 55 mL/min (A) (by C-G formula based on SCr of 1.39 mg/dL (H)). Liver Function Tests:  Recent Labs Lab 05/09/17 0853  AST 14*  ALT 13*  ALKPHOS 126  BILITOT 0.7  PROT 6.4*  ALBUMIN 2.8*   No results for input(s): LIPASE, AMYLASE in the last 168 hours. No results for input(s): AMMONIA in the last 168 hours. Coagulation profile No results for input(s): INR, PROTIME in the last 168 hours.  CBC:  Recent Labs Lab 05/09/17 0853 05/10/17 0244  WBC 4.2 4.6  NEUTROABS 2.6  --   HGB 10.8* 10.9*  HCT 34.4* 34.4*  MCV 90.5 87.3  PLT 187 224   Cardiac Enzymes: No results for input(s): CKTOTAL, CKMB, CKMBINDEX, TROPONINI in the last 168 hours. BNP: Invalid input(s): POCBNP CBG:  Recent Labs Lab 05/10/17 0253 05/10/17 0326 05/10/17 0416 05/10/17 0800 05/10/17 1157  GLUCAP 31* 58* 107* 321* 310*   D-Dimer No results for input(s): DDIMER in the  last 72 hours. Hgb A1c No results for input(s): HGBA1C in the last 72 hours. Lipid Profile No results for input(s): CHOL, HDL, LDLCALC, TRIG, CHOLHDL, LDLDIRECT in the last 72 hours. Thyroid function studies  Recent Labs  05/09/17 1444  TSH 7.045*   Anemia work up No results for input(s): VITAMINB12, FOLATE, FERRITIN, TIBC, IRON, RETICCTPCT in the last 72 hours. Microbiology Recent Results (from the past 240 hour(s))  MRSA PCR Screening     Status: None   Collection Time: 05/09/17  4:48 PM  Result Value Ref Range Status   MRSA by PCR NEGATIVE NEGATIVE Final    Comment:        The GeneXpert MRSA Assay (FDA approved for NASAL specimens only), is one  component of a comprehensive MRSA colonization surveillance program. It is not intended to diagnose MRSA infection nor to guide or monitor treatment for MRSA infections.      Discharge Instructions:   Discharge Instructions    Diet Carb Modified    Complete by:  As directed    Discharge instructions    Complete by:  As directed    Wear hand splint and follow up with  Dr. Merlyn Lot No alcohol   Increase activity slowly    Complete by:  As directed      Allergies as of 05/10/2017      Reactions   Ace Inhibitors Cough   Adhesive [tape] Itching, Other (See Comments)   Please use paper tape   Lisinopril Cough      Medication List    TAKE these medications   acetaminophen 325 MG tablet Commonly known as:  TYLENOL Take 2 tablets (650 mg total) by mouth every 6 (six) hours as needed for mild pain, moderate pain, fever or headache.   amLODipine 10 MG tablet Commonly known as:  NORVASC Take 1 tablet (10 mg total) by mouth daily.   aspirin EC 81 MG tablet Take 81 mg by mouth daily.   carvedilol 25 MG tablet Commonly known as:  COREG Take 50 mg by mouth 2 (two) times daily.   doxycycline 100 MG tablet Commonly known as:  VIBRA-TABS Take 1 tablet (100 mg total) by mouth every 12 (twelve) hours.   FREESTYLE LIBRE READER Devi 1 each by Does not apply route 4 (four) times daily.   FREESTYLE LIBRE SENSOR SYSTEM Misc 1 sensor every 10 days   hydrALAZINE 25 MG tablet Commonly known as:  APRESOLINE Take 1 tablet (25 mg total) by mouth every 12 (twelve) hours.   insulin NPH-regular Human (70-30) 100 UNIT/ML injection Commonly known as:  NOVOLIN 70/30 Inject 20 Units into the skin 2 (two) times daily with a meal.   mycophenolate 250 MG capsule Commonly known as:  CELLCEPT Take 500 mg by mouth 2 (two) times daily.   predniSONE 5 MG tablet Commonly known as:  DELTASONE Take 5 mg by mouth at bedtime.   SYNTHROID 137 MCG tablet Generic drug:  levothyroxine Take 137 mcg  by mouth daily.   tacrolimus 0.5 MG capsule Commonly known as:  PROGRAF Take 0.5-1 mg by mouth 2 (two) times daily. Take 0.5mg  by mouth in the morning and 1mg  by mouth at bedtime   vitamin B-12 100 MCG tablet Commonly known as:  CYANOCOBALAMIN Take 1 tablet (100 mcg total) by mouth daily.            Discharge Care Instructions        Start     Ordered   05/10/17 0000  Increase activity slowly     05/10/17 1553   05/10/17 0000  Diet Carb Modified     05/10/17 1553   05/10/17 0000  Discharge instructions    Comments:  Wear hand splint and follow up with  Dr. Merlyn LotKuzma No alcohol   05/10/17 1554     Follow-up Information    [facility] Follow up.   Why:  CM will call pt 05/11/2017 with an appointment.   Contact information: [address] [place] [id number] [phone]       [facility] Follow up.   Why:  You may call this number to get the names of local doctors accepting new patients.  Contact information: 1.866 [phone]       Betha LoaKuzma, Kevin, MD. Call.   Specialty:  Orthopedic Surgery Why:  call in the AM for an appointment Contact information: [address] [place] [address] [phone]        Talmage CoinKerr, Jeffrey, MD Follow up.   Specialty:  Endocrinology Why:  can call to see if accepting new patients Contact information: [address] [place] [address] [phone]        Carlus PavlovGherghe, Cristina, MD Follow up.   Specialty:  Internal Medicine Why:  also an endocrinologist, can call to see if taking new patients Contact information: [address] [place] [address] [phone]            Time coordinating discharge: 35 min  Signed:  Naasir Carreira Juanetta GoslingU Fantashia Shupert   [facility] 05/10/2017, 3:54 PM

## 2017-05-10 NOTE — Progress Notes (Signed)
Hypoglycemic Event  Date:  05/10/17 Time:  0253  CBG:  31  Treatment:  Snack  Symptoms:  Sweating  Follow-up CBG:  58 Time:  0326 Treatment:  Another snack Follow-up CBG Result:  107 Time:  0416 Possible Reasons for Event:  (Novolog Mix 70/30) 20units given at [employer]1834

## 2017-05-10 NOTE — Progress Notes (Signed)
Orthopedic Tech Progress Note Patient Details:  [name] 1966-05-14 [id number]  Ortho Devices Type of Ortho Device: Ulna gutter splint, Arm sling Ortho Device/Splint Location: applied ulna gutter splint to pt left hand/arm.  pt tolerated application very well.  applied arm sling to left arm for support.  left hand. Ortho Device/Splint Interventions: Application, Adjustment   Alvina ChouWilliams, Menashe Kafer C 05/10/2017, 4:19 PM

## 2017-05-10 NOTE — Care Management Obs Status (Signed)
MEDICARE OBSERVATION STATUS NOTIFICATION   Patient Details  Name: [name] MRN: [id number] Date of Birth: November 26, 1965   Medicare Observation Status Notification Given:  Yes    Brittlyn Cloe, Annamarie Majorheryl U, RN 05/10/2017, 11:13 AM

## 2017-05-10 NOTE — Progress Notes (Signed)
Discharge instructions, medications and follow up appointments discussed and reviewed with pt, verbalized understanding. IV dc'ed, site clean and dry. Pt splint to left hand applied by ortho tech prior to discharge. Pt was escorted out of the unit in wheelchair, took all belongings with her.

## 2017-05-10 NOTE — Progress Notes (Signed)
Inpatient Diabetes Program Recommendations  AACE/ADA: New Consensus Statement on Inpatient Glycemic Control (2015)  Target Ranges:  Prepandial:   less than 140 mg/dL      Peak postprandial:   less than 180 mg/dL (1-2 hours)      Critically ill patients:  140 - 180 mg/dL   Lab Results  Component Value Date   GLUCAP 310 (H) 05/10/2017   HGBA1C 9.4 (H) 04/25/2017    Review of Glycemic Control Results for [name], [name] (MRN [id number]) as of 05/10/2017 13:40  Ref. Range 05/10/2017 02:53 05/10/2017 03:26 05/10/2017 04:16 05/10/2017 08:00 05/10/2017 11:57  Glucose-Capillary Latest Ref Range: 65 - 99 mg/dL 31 (LL) 58 (L) 956107 (H) 321 (H) 310 (H)   Diabetes history: DM1 Outpatient Diabetes medications: 70/30 Novolin mix 20 units bid Current orders for Inpatient glycemic control: 70/30 20 units bid + Novolog correction sensitive + 0-5 units hs  Inpatient Diabetes Program Recommendations:  Spoke with patient @ bedside regarding glucose management since D/C on 04/29/17. DM coordinator spoke with patient during last admission. Patient states she has been taking her insulin as prescribed except unsure if she took insulin evening prior to admission along with drinking 6 wine coolers through the night. Patient is unsure how many carbohydrates in each wine cooler. Patient states she knows to take her insulin and has insulin supply @ home.  Thank you, Billy FischerJudy E. Ramani Riva, RN, MSN, CDE  Diabetes Coordinator Inpatient Glycemic Control Team Team Pager [phone] (8am-5pm) 05/10/2017 1:44 PM

## 2017-05-10 NOTE — Care Management Note (Addendum)
Case Management Note         LATE ENTRY 05/11/17  Patient Details  Name: [name] MRN: [id number] Date of Birth: 06-18-1966  Subjective/Objective:          CM following for progression and d/c planning.           Action/Plan: 05/10/2017 Met with pt to discuss observation status and d/c plans. Pt will return to home, states that she does not have a PCP at this time. This CM attempted to schedule this pt at [facility]) for the clinic or for transitional Care as the pt has insurance. Both clinics closed for the holiday and unable to reach CM for assistance . Also placed call to the [facility] attempting to place pt as an overflow for hospital followup. This clinic was also closed.  This was explained to the pt and this CM will place calls again tomorrow 05/11/2017 and pt is agreeable to be contacted at home with any appointments. Pt was also provided with the phone number for [facility] to obtain the names of local doctors accepting new patients as she has insurance and would be able to select her own physician. Dr Eliseo Squires notified of these interventions.  05/11/2017 Attempting to schedule this pt for hospital follow up. Multiple attempts to reach [facility] , [place], [place] for Transitional Care . Unable to continue holding after 31 minute wait. This CM then scheduled this at the [facility] for overflow post hospital followup on Friday May 21, 2017 at 9am.  Notified [name] of appointment and was informed that she only has transportation on Wed or [place] when her husband is not working. This CM then contacted the [facility] and arranged an appointment for [facility], Sept 20, 2018 @ 9:30am. The pt was contacted again and given this information. The pt stated that she was writing the information down and repeated the location, date and time of the appointment. The pt was reminded to call the number for [facility] to arrange an  appointment with a doctor accepting new patient . Pt was reminded that the number for [facility] was in her d/c instructions and the number was given to the pt again.   Expected Discharge Date:   05/10/2017               Expected Discharge Plan:  Home/Self Care  In-House Referral:  NA  Discharge planning Services  CM Consult, [facility]  Post Acute Care Choice:  NA Choice offered to:  NA  DME Arranged:  N/A DME Agency:  NA  HH Arranged:  NA HH Agency:  NA  Status of Service:  Completed, signed off  If discussed at [place] of Stay Meetings, dates discussed:    Additional Comments:  Adron Bene, RN 05/10/2017, 3:30 PM

## 2017-05-21 ENCOUNTER — Ambulatory Visit: Payer: Medicare Other | Admitting: Family Medicine

## 2017-05-27 ENCOUNTER — Ambulatory Visit: Payer: Medicare Other | Admitting: Family Medicine

## 2017-05-28 ENCOUNTER — Encounter (HOSPITAL_COMMUNITY): Payer: Self-pay

## 2017-05-28 ENCOUNTER — Emergency Department (HOSPITAL_COMMUNITY)
Admission: EM | Admit: 2017-05-28 | Discharge: 2017-05-28 | Disposition: A | Discharge status: Home / Self Care | Payer: Medicare Other | Attending: Emergency Medicine | Admitting: Emergency Medicine

## 2017-05-28 ENCOUNTER — Emergency Department (HOSPITAL_COMMUNITY): Payer: Medicare Other

## 2017-05-28 DIAGNOSIS — Z940 Kidney transplant status: Secondary | ICD-10-CM | POA: Insufficient documentation

## 2017-05-28 DIAGNOSIS — H53149 Visual discomfort, unspecified: Secondary | ICD-10-CM | POA: Insufficient documentation

## 2017-05-28 DIAGNOSIS — G43009 Migraine without aura, not intractable, without status migrainosus: Secondary | ICD-10-CM

## 2017-05-28 DIAGNOSIS — Z79899 Other long term (current) drug therapy: Secondary | ICD-10-CM | POA: Insufficient documentation

## 2017-05-28 DIAGNOSIS — N186 End stage renal disease: Secondary | ICD-10-CM | POA: Diagnosis not present

## 2017-05-28 DIAGNOSIS — R112 Nausea with vomiting, unspecified: Secondary | ICD-10-CM | POA: Insufficient documentation

## 2017-05-28 DIAGNOSIS — E1122 Type 2 diabetes mellitus with diabetic chronic kidney disease: Secondary | ICD-10-CM | POA: Insufficient documentation

## 2017-05-28 DIAGNOSIS — Z7982 Long term (current) use of aspirin: Secondary | ICD-10-CM | POA: Insufficient documentation

## 2017-05-28 DIAGNOSIS — R51 Headache: Secondary | ICD-10-CM | POA: Diagnosis present

## 2017-05-28 DIAGNOSIS — Z87891 Personal history of nicotine dependence: Secondary | ICD-10-CM | POA: Diagnosis not present

## 2017-05-28 DIAGNOSIS — I120 Hypertensive chronic kidney disease with stage 5 chronic kidney disease or end stage renal disease: Secondary | ICD-10-CM | POA: Diagnosis not present

## 2017-05-28 DIAGNOSIS — Z794 Long term (current) use of insulin: Secondary | ICD-10-CM | POA: Diagnosis not present

## 2017-05-28 DIAGNOSIS — E039 Hypothyroidism, unspecified: Secondary | ICD-10-CM | POA: Diagnosis not present

## 2017-05-28 LAB — BASIC METABOLIC PANEL
Anion gap: 8 (ref 5–15)
BUN: 22 mg/dL — AB (ref 6–20)
CO2: 23 mmol/L (ref 22–32)
CREATININE: 1.54 mg/dL — AB (ref 0.44–1.00)
Calcium: 10 mg/dL (ref 8.9–10.3)
Chloride: 106 mmol/L (ref 101–111)
GFR calc Af Amer: 44 mL/min — ABNORMAL LOW (ref >60)
GFR, EST NON AFRICAN AMERICAN: 38 mL/min — AB (ref >60)
GLUCOSE: 136 mg/dL — AB (ref 65–99)
POTASSIUM: 3.8 mmol/L (ref 3.5–5.1)
Sodium: 137 mmol/L (ref 135–145)

## 2017-05-28 LAB — I-STAT CG4 LACTIC ACID, ED
Lactic Acid, Venous: 0.8 mmol/L (ref 0.5–1.9)
Lactic Acid, Venous: 0.83 mmol/L (ref 0.5–1.9)

## 2017-05-28 LAB — CBC WITH DIFFERENTIAL/PLATELET
Basophils Absolute: 0 10*3/uL (ref 0.0–0.1)
Basophils Relative: 0 %
EOS ABS: 0.1 10*3/uL (ref 0.0–0.7)
EOS PCT: 2 %
HCT: 35 % — ABNORMAL LOW (ref 36.0–46.0)
Hemoglobin: 11.4 g/dL — ABNORMAL LOW (ref 12.0–15.0)
LYMPHS ABS: 0.9 10*3/uL (ref 0.7–4.0)
LYMPHS PCT: 22 %
MCH: 28.8 pg (ref 26.0–34.0)
MCHC: 32.6 g/dL (ref 30.0–36.0)
MCV: 88.4 fL (ref 78.0–100.0)
MONO ABS: 0.5 10*3/uL (ref 0.1–1.0)
MONOS PCT: 13 %
Neutro Abs: 2.6 10*3/uL (ref 1.7–7.7)
Neutrophils Relative %: 63 %
PLATELETS: 198 10*3/uL (ref 150–400)
RBC: 3.96 MIL/uL (ref 3.87–5.11)
RDW: 13.4 % (ref 11.5–15.5)
WBC: 4.1 10*3/uL (ref 4.0–10.5)

## 2017-05-28 LAB — I-STAT VENOUS BLOOD GAS, ED
ACID-BASE DEFICIT: 2 mmol/L (ref 0.0–2.0)
BICARBONATE: 22.8 mmol/L (ref 20.0–28.0)
O2 SAT: 72 %
PO2 VEN: 38 mmHg (ref 32.0–45.0)
TCO2: 24 mmol/L (ref 22–32)
pCO2, Ven: 36.7 mmHg — ABNORMAL LOW (ref 44.0–60.0)
pH, Ven: 7.402 (ref 7.250–7.430)

## 2017-05-28 LAB — I-STAT BETA HCG BLOOD, ED (MC, WL, AP ONLY): I-stat hCG, quantitative: 12.8 m[IU]/mL — ABNORMAL HIGH (ref <5)

## 2017-05-28 LAB — CBG MONITORING, ED: GLUCOSE-CAPILLARY: 135 mg/dL — AB (ref 65–99)

## 2017-05-28 MED ORDER — KETOROLAC TROMETHAMINE 30 MG/ML IJ SOLN
15.0000 mg | Freq: Once | INTRAMUSCULAR | Status: AC
  Administered 2017-05-28: 15 mg via INTRAVENOUS
  Filled 2017-05-28: qty 1

## 2017-05-28 MED ORDER — VALPROATE SODIUM 500 MG/5ML IV SOLN
500.0000 mg | Freq: Once | INTRAVENOUS | Status: AC
  Administered 2017-05-28: 500 mg via INTRAVENOUS
  Filled 2017-05-28: qty 5

## 2017-05-28 MED ORDER — DIPHENHYDRAMINE HCL 50 MG/ML IJ SOLN
25.0000 mg | Freq: Once | INTRAMUSCULAR | Status: AC
  Administered 2017-05-28: 25 mg via INTRAVENOUS
  Filled 2017-05-28: qty 1

## 2017-05-28 MED ORDER — METOCLOPRAMIDE HCL 5 MG/ML IJ SOLN
10.0000 mg | Freq: Once | INTRAMUSCULAR | Status: AC
  Administered 2017-05-28: 10 mg via INTRAVENOUS
  Filled 2017-05-28: qty 2

## 2017-05-28 MED ORDER — VALPROATE SODIUM 500 MG/5ML IV SOLN: 500.0000 mg | Freq: Once | INTRAVENOUS | Status: DC

## 2017-05-28 MED ORDER — SODIUM CHLORIDE 0.9 % IV BOLUS (SEPSIS)
1000.0000 mL | Freq: Once | INTRAVENOUS | Status: AC
  Administered 2017-05-28: 1000 mL via INTRAVENOUS

## 2017-05-28 NOTE — ED Provider Notes (Signed)
  Physical Exam  BP (!) 182/84 (BP Location: Right Arm)   Pulse 90   Temp 98.2 F (36.8 C)   Resp 18   LMP 11/19/2011   SpO2 98%   Physical Exam  ED Course  Procedures  MDM Patient care assumed at 7 am. Patient here with vomiting and headache. CT head unremarkable, labs at baseline. Sign out to reassess patient.   8:15 AM Patient ambulated. Comfortable currently, headache improved. Will dc home, will refer to neuro for management of headaches. Encourage her to continue current meds for diabetes.       Charlynne Pander, MD 05/28/17 [phone]

## 2017-05-28 NOTE — ED Provider Notes (Signed)
[facility]-EMERGENCY DEPT Provider Note   CSN: [id number] Arrival date & time: 05/28/17  0137     History   Chief Complaint Chief Complaint  Patient presents with  . Headache    HPI [name] is a 51 y.o. female.  Patient presents via EMS with diffuse headache with gradual onset earlier today. She describes nausea and vomiting with multiple episodes of nonbloody nonbilious emesis. Headache is associated with photophobia. She took Tylenol without relief. States she has had headaches similar to this in the past but has never been diagnosed with migraines. Denies fever. Denies focal weakness, numbness or tingling. History remarkable for diabetes and renal transplant 2011. Recent admission for DKA.   The history is provided by the patient and the EMS personnel.  Headache   Associated symptoms include nausea and vomiting. Pertinent negatives include no fever and no shortness of breath.    Past Medical History:  Diagnosis Date  . Arthritis    "elbows, knees, legs, back" (09/24/2015)  . CKD (chronic kidney disease)   . Daily headache   . Depression    "years ago"  . End stage renal disease (HCC)    right arm AV graft, resolved post transplant  . Hypertension   . Hypothyroid   . 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 Kidney disease 2011  . Pneumonia ~ 2007?  [name] Type II diabetes mellitus (HCC)    Insulin dependant    Patient Active Problem List   Diagnosis Date Noted  . Hyperglycemia 05/09/2017  . Lower extremity cellulitis 03/23/2017  . Type II diabetes mellitus (HCC) 03/23/2017  . Anemia 01/26/2017  . Abnormal LFTs 01/26/2017  . DKA (diabetic ketoacidoses) (HCC) 08/12/2016  . Hypoglycemia   . Wound cellulitis   . CKD (chronic kidney disease) stage 3, GFR 30-59 ml/min 06/26/2016  . Cellulitis of right lower extremity   . QT prolongation 05/13/2016  . UTI (lower urinary tract infection)   . Hypomagnesemia   . Pressure ulcer 04/11/2016  . Acute  encephalopathy 04/11/2016  . Altered mental status 04/10/2016  . Insulin dependent diabetes mellitus (HCC) 04/10/2016  . Diabetic ketoacidosis without coma associated with type 2 diabetes mellitus (HCC) 11/02/2015  . Uncontrolled type 1 diabetes mellitus with foot ulcer (HCC) 06/04/2015  . Foot ulcer, right (HCC)   . Diarrhea 06/02/2015  . Type 1 diabetes mellitus with diabetic foot ulcer (HCC) 06/02/2015  . Acute renal failure superimposed on stage 3 chronic kidney disease (HCC) 06/02/2015  . Diabetic foot infection (HCC) 06/01/2015  . Essential hypertension 06/01/2015  . GERD (gastroesophageal reflux disease) 06/01/2015  . Hypotension 03/22/2014  . H/O kidney transplant 08/04/2012  . Sepsis (HCC) 08/04/2012  . Hyperkalemia 09/22/2011  . Hyponatremia 09/22/2011  . Immunosuppression (HCC)   . Hypothyroidism     Past Surgical History:  Procedure Laterality Date  . AMPUTATION Left 05/11/2014   Procedure: AMPUTATION LEFT GREAT TOE;  Surgeon: Kathryne Hitch, MD;  Location: [facility];  Service: Orthopedics;  Laterality: Left;  . AV FISTULA PLACEMENT Right    forearm  . BACK SURGERY    . CATARACT EXTRACTION W/ INTRAOCULAR LENS  IMPLANT, BILATERAL Bilateral   . CESAREAN SECTION  07/1999  . DG AV DIALYSIS GRAFT DECLOT OR    . INCISION AND DRAINAGE Right 06/02/2015   Procedure: INCISION AND DRAINAGE OF RIGHT 3rd RAY RESECTION;  Surgeon: Kathryne Hitch, MD;  Location: [facility] OR;  Service: Orthopedics;  Laterality: Right;  . KIDNEY TRANSPLANT  Right December 16, 2009  . LUMBAR DISC SURGERY  2001  . NEPHRECTOMY TRANSPLANTED ORGAN    . TOE AMPUTATION Right    1,2 & 3rd toes.  . TUBAL LIGATION  07/1999    OB History    No data available       Home Medications    Prior to Admission medications   Medication Sig Start Date End Date Taking? Authorizing Provider  acetaminophen (TYLENOL) 325 MG tablet Take 2 tablets (650 mg total) by mouth every 6 (six) hours as needed for mild pain,  moderate pain, fever or headache. 05/15/16   Hongalgi, Maximino Greenland, MD  amLODipine (NORVASC) 10 MG tablet Take 1 tablet (10 mg total) by mouth daily. 08/14/16   Regalado, Jon Billings A, MD  aspirin EC 81 MG tablet Take 81 mg by mouth daily.    [provider]  carvedilol (COREG) 25 MG tablet Take 50 mg by mouth 2 (two) times daily. 02/26/17   [provider]  Continuous Blood Gluc Receiver (FREESTYLE LIBRE READER) DEVI 1 each by Does not apply route 4 (four) times daily. 04/29/17   Leatha Gilding, MD  Continuous Blood Gluc Sensor (FREESTYLE LIBRE SENSOR SYSTEM) MISC 1 sensor every 10 days 04/29/17   Leatha Gilding, MD  doxycycline (VIBRA-TABS) 100 MG tablet Take 1 tablet (100 mg total) by mouth every 12 (twelve) hours. 04/29/17   Leatha Gilding, MD  hydrALAZINE (APRESOLINE) 25 MG tablet Take 1 tablet (25 mg total) by mouth every 12 (twelve) hours. 04/29/17 05/29/17  Leatha Gilding, MD  insulin NPH-regular Human (NOVOLIN 70/30) (70-30) 100 UNIT/ML injection Inject 20 Units into the skin 2 (two) times daily with a meal. 04/14/17   Amin, Loura Halt, MD  mycophenolate (CELLCEPT) 250 MG capsule Take 500 mg by mouth 2 (two) times daily.     [provider]  predniSONE (DELTASONE) 5 MG tablet Take 5 mg by mouth at bedtime.  05/29/15   [provider]  SYNTHROID 137 MCG tablet Take 137 mcg by mouth daily. 04/10/17   [provider]  tacrolimus (PROGRAF) 0.5 MG capsule Take 0.5-1 mg by mouth 2 (two) times daily. Take 0.5mg  by mouth in the morning and  by mouth at bedtime    [provider]  vitamin B-12 (CYANOCOBALAMIN) 100 MCG tablet Take 1 tablet (100 mcg total) by mouth daily. Patient not taking: Reported on 05/09/2017 03/29/17 03/29/18  Alba Cory, MD    Family History Family History  Problem Relation Age of Onset  . Emphysema Mother   . Throat cancer Mother   . COPD Mother   . Cancer Mother   . Emphysema Father   . COPD Father   . Stroke  Father   . ADD / ADHD Son     Social History Social History  Substance Use Topics  . Smoking status: Former Smoker    Packs/day: 1.00    Years: 11.00    Types: Cigarettes    Quit date: 09/21/1998  . Smokeless tobacco: Never Used  . Alcohol use No     Allergies   Ace inhibitors; Adhesive [tape]; and Lisinopril   Review of Systems Review of Systems  Constitutional: Positive for activity change and appetite change. Negative for fever.  HENT: Negative for congestion.   Eyes: Positive for photophobia.  Respiratory: Negative for cough, chest tightness and shortness of breath.   Cardiovascular: Negative for chest pain.  Gastrointestinal: Positive for nausea and vomiting. Negative for abdominal pain.  Genitourinary: Negative for dysuria, hematuria, vaginal bleeding and vaginal discharge.  Musculoskeletal: Negative for arthralgias and myalgias.  Neurological: Positive for headaches. Negative for dizziness, weakness, light-headedness and numbness.    all other systems are negative except as noted in the HPI and PMH.    Physical Exam Updated Vital Signs BP (!) 182/88   Pulse 83   Temp 98.2 F (36.8 C)   Resp 18   LMP 11/19/2011   SpO2 99%   Physical Exam  Constitutional: She is oriented to person, place, and time. She appears well-developed and well-nourished. No distress.  HENT:  Head: Normocephalic and atraumatic.  Mouth/Throat: Oropharynx is clear and moist. No oropharyngeal exudate.  photophobic  Eyes: Pupils are equal, round, and reactive to light. Conjunctivae and EOM are normal.  Neck: Normal range of motion. Neck supple.  No meningismus. No pain with ROM neck  Cardiovascular: Normal rate, regular rhythm, normal heart sounds and intact distal pulses.   No murmur heard. Pulmonary/Chest: Effort normal and breath sounds normal. No respiratory distress. She exhibits no tenderness.  Abdominal: Soft. There is no tenderness. There is no rebound and no guarding.    Musculoskeletal: Normal range of motion. She exhibits no edema or tenderness.  Neurological: She is alert and oriented to person, place, and time. No cranial nerve deficit. She exhibits normal muscle tone. Coordination normal.   5/5 strength throughout. CN 2-12 intact.Equal grip strength.   Skin: Skin is warm.  Psychiatric: She has a normal mood and affect. Her behavior is normal.  Nursing note and vitals reviewed.    ED Treatments / Results  Labs (all labs ordered are listed, but only abnormal results are displayed) Labs Reviewed  CBC WITH DIFFERENTIAL/PLATELET - Abnormal; Notable for the following:       Result Value   Hemoglobin 11.4 (*)    HCT 35.0 (*)    All other components within normal limits  BASIC METABOLIC PANEL - Abnormal; Notable for the following:    Glucose, Bld 136 (*)    BUN 22 (*)    Creatinine, Ser 1.54 (*)    GFR calc non Af Amer 38 (*)    GFR calc Af Amer 44 (*)    All other components within normal limits  I-STAT VENOUS BLOOD GAS, ED - Abnormal; Notable for the following:    pCO2, Ven 36.7 (*)    All other components within normal limits  CBG MONITORING, ED - Abnormal; Notable for the following:    Glucose-Capillary 135 (*)    All other components within normal limits  I-STAT BETA HCG BLOOD, ED ([facility], [facility], [facility] ONLY) - Abnormal; Notable for the following:    I-stat hCG, quantitative 12.8 (*)    All other components within normal limits  I-STAT CG4 LACTIC ACID, ED  CBG MONITORING, ED  I-STAT CG4 LACTIC ACID, ED    EKG  EKG Interpretation None       Radiology Ct Head Wo Contrast  Result Date: 05/28/2017 CLINICAL DATA:  Headaches all over starting today.  Nausea. EXAM: CT HEAD WITHOUT CONTRAST TECHNIQUE: Contiguous axial images were obtained from the base of the skull through the vertex without intravenous contrast. COMPARISON:  05/09/2017 FINDINGS: Brain: Mild diffuse cerebral atrophy. Patchy low-attenuation changes in the deep white matter consistent  with small vessel ischemia. No evidence of acute infarction, hemorrhage, hydrocephalus, extra-axial collection or mass lesion/mass effect. Vascular: Vascular calcifications in the internal carotid artery's. Skull: Calvarium appears intact. Sinuses/Orbits: There is diffuse opacification of the paranasal  sinuses. Maxillary antral walls are expanded and there is increased density within the sinuses consistent with inspissated mucus, chronic sinusitis, and/ or fungal disease. Appearance is similar to previous study. Mastoid air cells are not opacified. Other: None. IMPRESSION: No acute intracranial abnormalities. Mild chronic atrophy and small vessel ischemic changes. Changes of pansinusitis. No change since prior study. Electronically Signed   By: Burman Nieves M.D.   On: 05/28/2017 03:44    Procedures Procedures (including critical care time)  Medications Ordered in ED Medications  metoCLOPramide (REGLAN) injection 10 mg (not administered)  diphenhydrAMINE (BENADRYL) injection 25 mg (not administered)     Initial Impression / Assessment and Plan / ED Course  I have reviewed the triage vital signs and the nursing notes.  Pertinent labs & imaging results that were available during my care of the patient were reviewed by me and considered in my medical decision making (see chart for details).    Transplant patient presenting with gradual onset diffuse headache with photophobia and nausea and vomiting. No fever.  nonfocal neuro exam.  CT head stable. Labs with stable creatinine. No evidence of DKA.  Treated as migraine with IVF and antiemetics. Low suspicion for SAH, meningitis, temporal arteritis.  Headache improved with treatment in the ED. Will attempt PO hydration and ambulation. Dr. Silverio Lay to assume care at shift change.  Final Clinical Impressions(s) / ED Diagnoses   Final diagnoses:  Migraine without aura and without status migrainosus, not intractable    New Prescriptions New  Prescriptions   No medications on file     Glynn Octave, MD 05/28/17 [phone]

## 2017-05-28 NOTE — ED Triage Notes (Signed)
Pt here for headache onset today, rates 10/10 no other complaints, sts photophobia.

## 2017-05-28 NOTE — ED Notes (Signed)
Pt ambulated in the hall without assistance.  

## 2017-05-28 NOTE — Discharge Instructions (Addendum)
Take tylenol for headaches.   Continue your current medicines.   See your doctor. See neurologist for follow up   Return to ER if you have worse headaches, dizziness, vomiting, weakness, abdominal pain, fever.

## 2017-05-31 IMAGING — US US ABDOMEN LIMITED
1 series · 14 of 25 positions shown · non-contrast
Comparison: None.

CLINICAL DATA: Abnormal liver function tests.

EXAM:
US ABDOMEN LIMITED - RIGHT UPPER QUADRANT

[Series 1: us abdomen limited · 0.23mm/px · 14 of 46 slices shown]
[im 1/46]
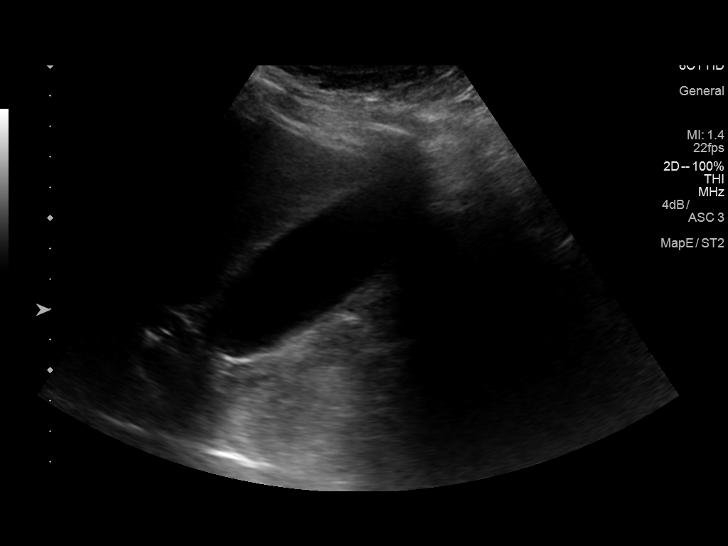
[im 4/46]
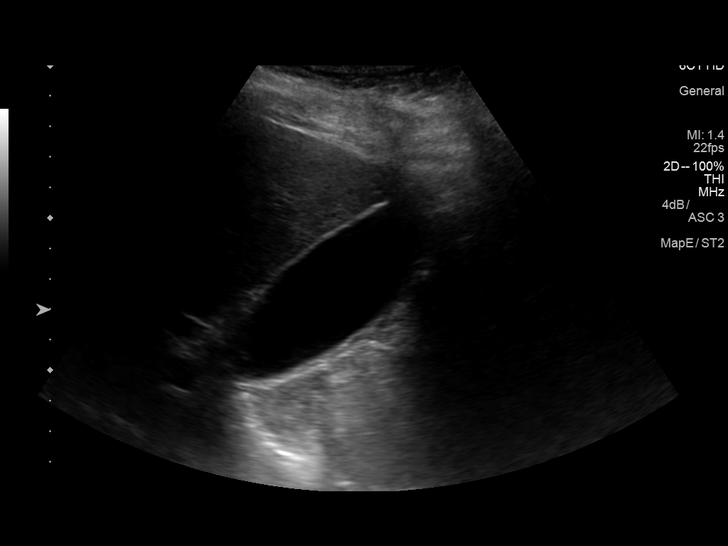
[im 8/46]
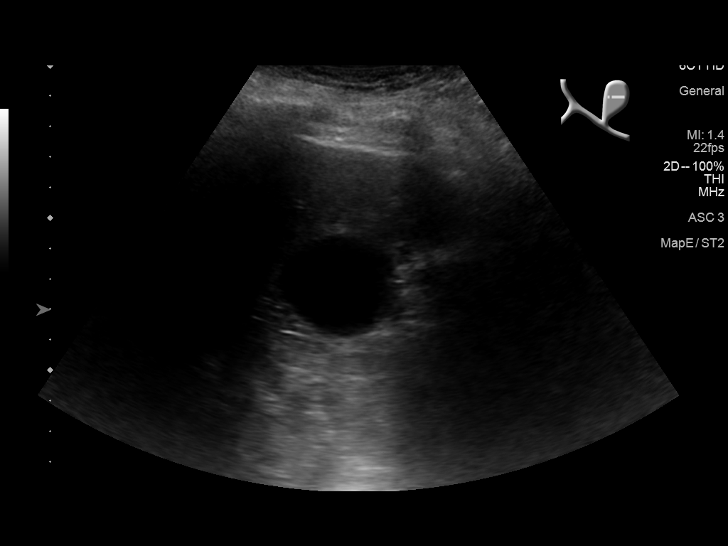
[im 12/46]
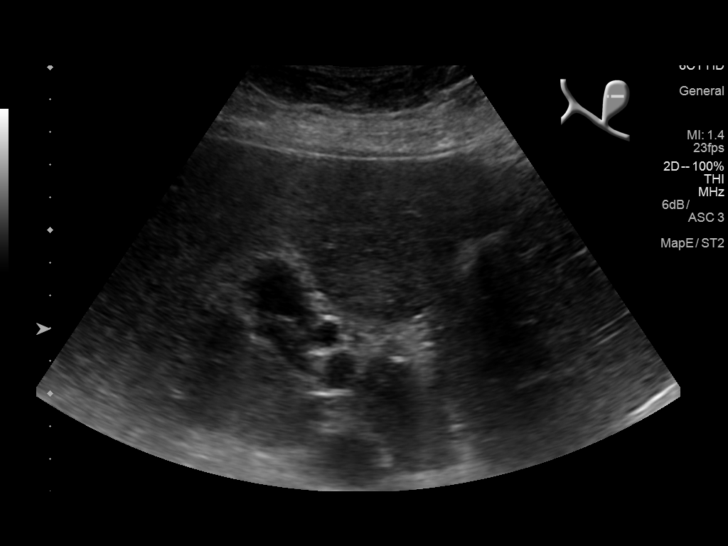
[im 16/46]
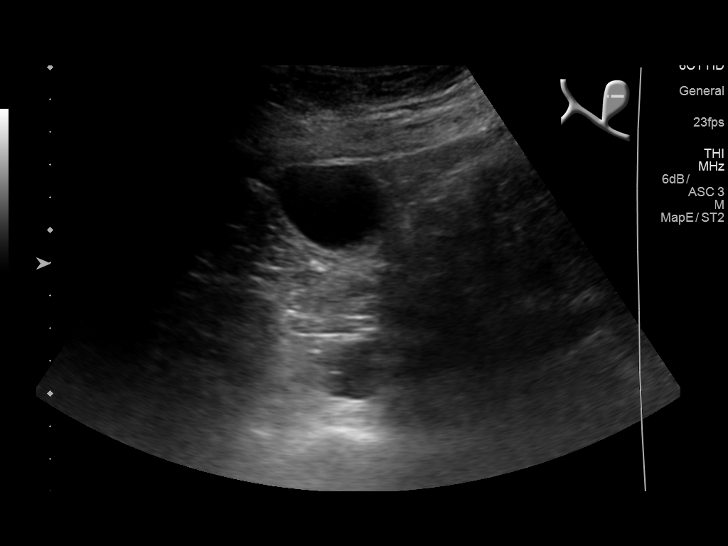
[im 17/46]
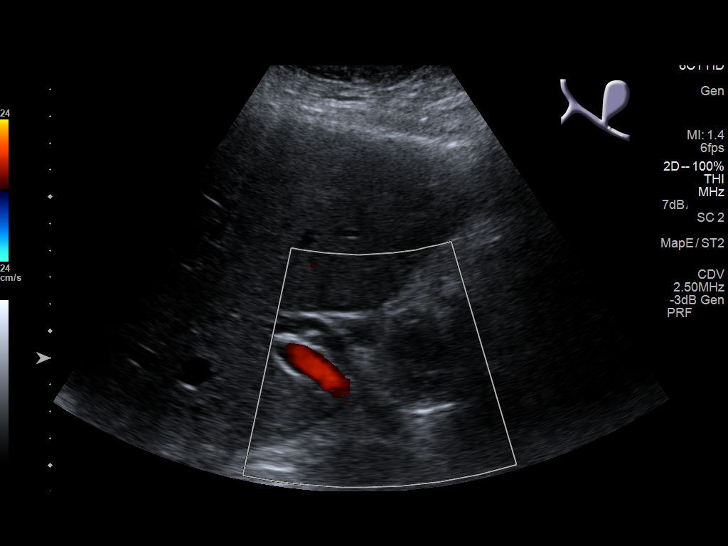
[im 21/46]
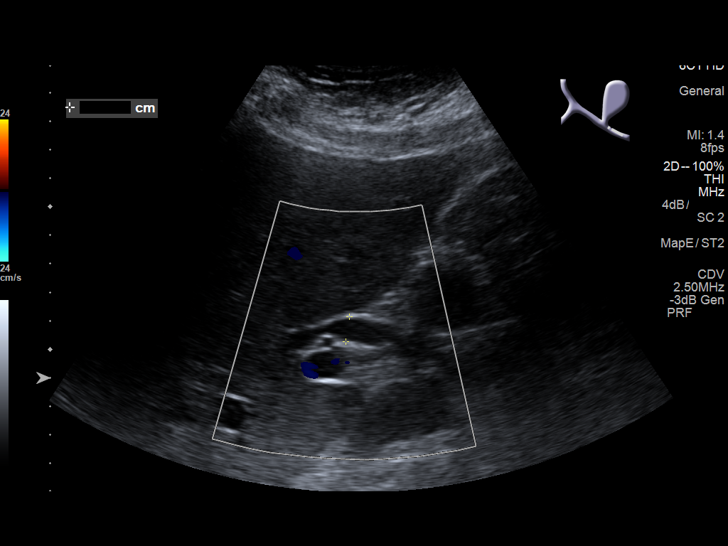
[im 25/46]
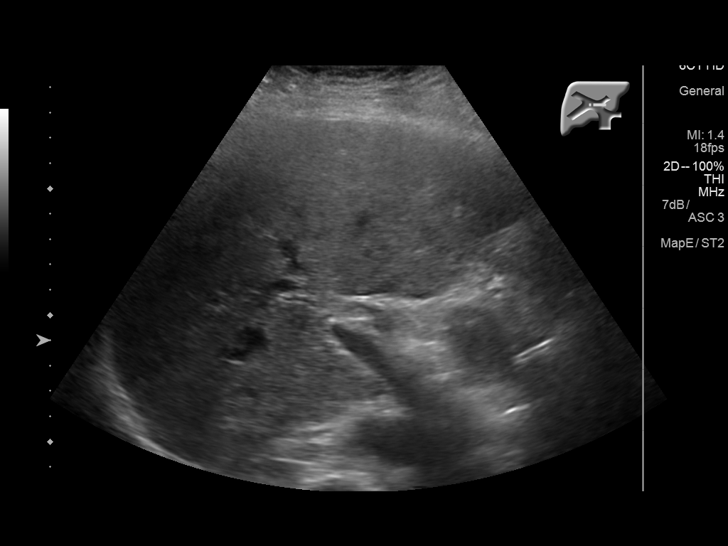
[im 29/46]
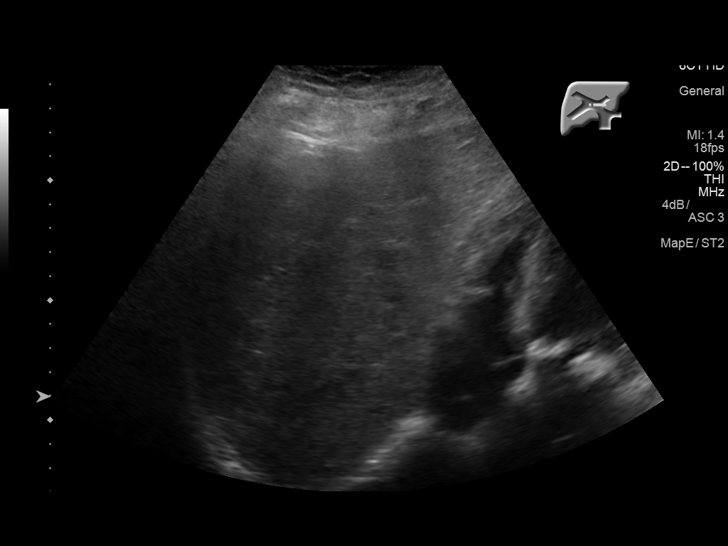
[im 31/46]
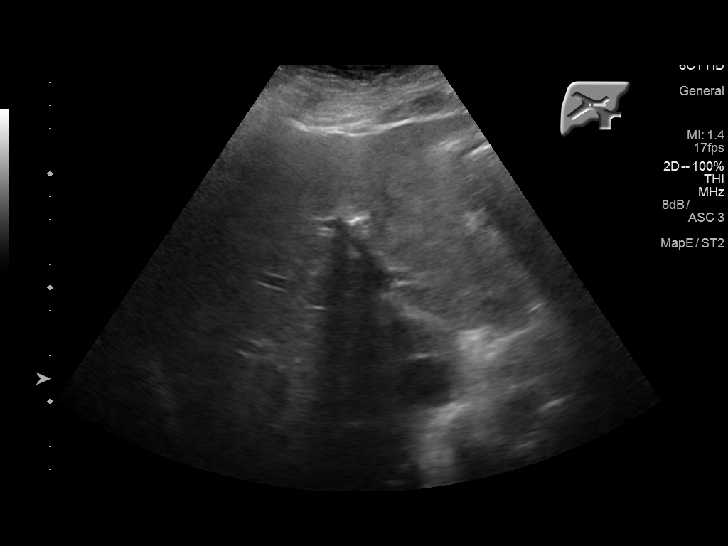
[im 34/46]
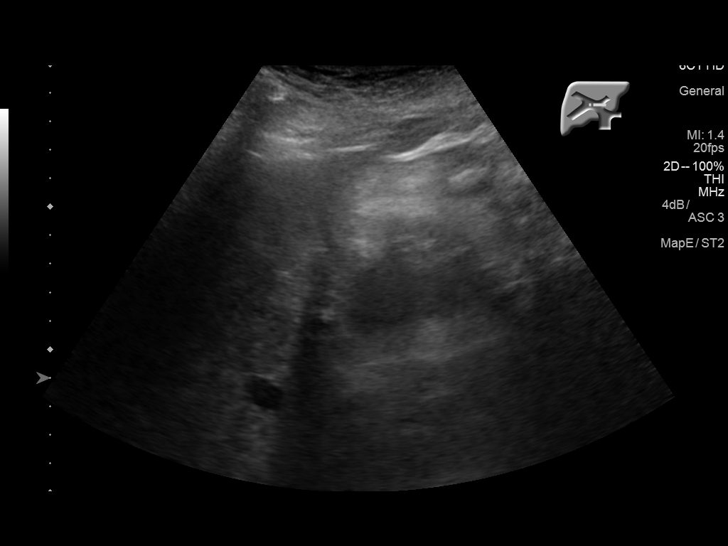
[im 38/46]
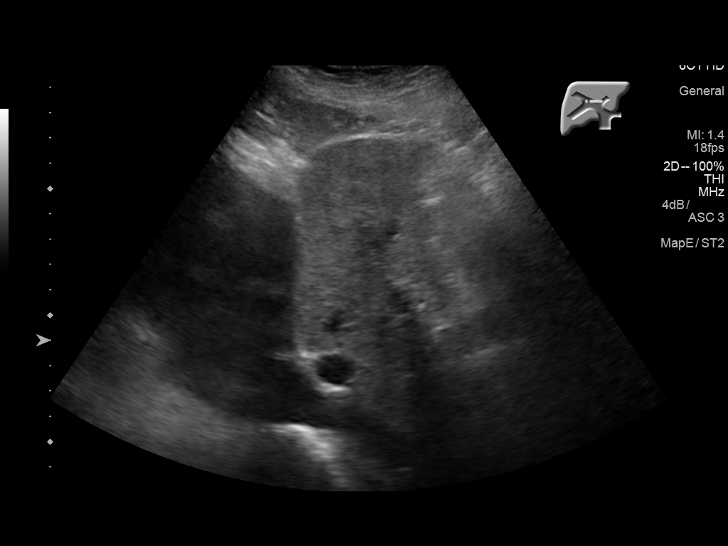
[im 42/46]
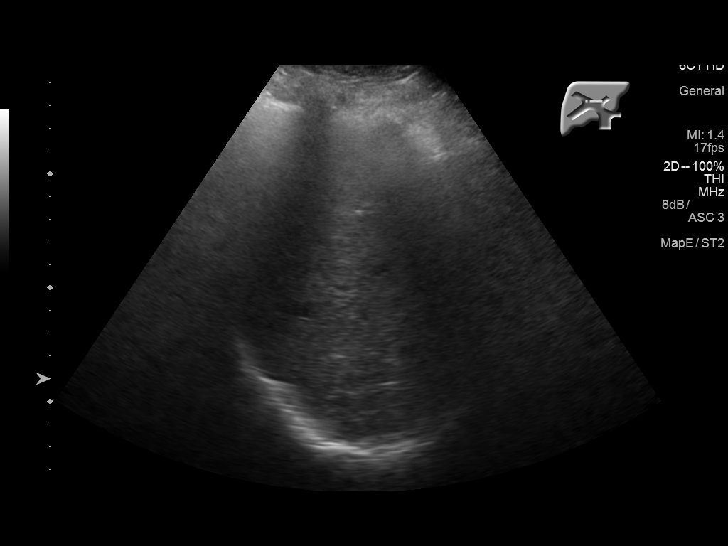
[im 46/46]
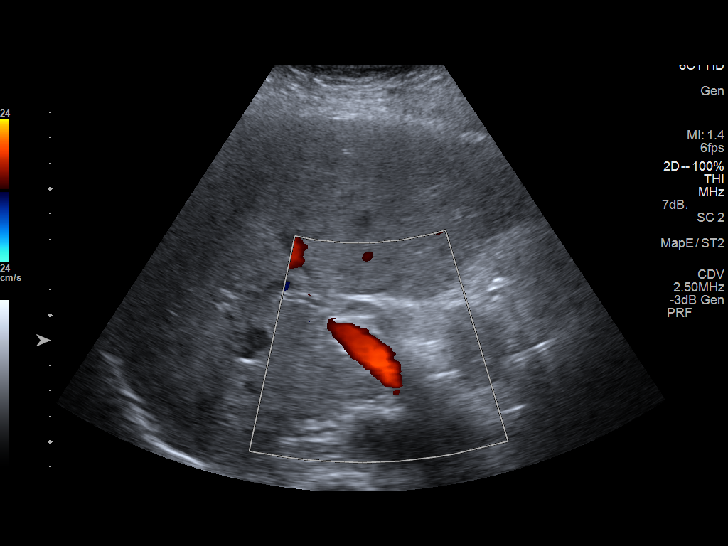

[14 of 25 positions shown; findings below may reference images not displayed]

FINDINGS: Gallbladder:

No gallstones or wall thickening visualized. No sonographic Murphy
sign noted by sonographer.

Common bile duct:

Diameter: 8.8 mm which is dilated.

Liver:

No focal lesion identified. Slightly increased and coarse
echotexture of hepatic parenchyma is noted. No intrahepatic biliary
dilatation is noted.
IMPRESSION: Slightly increasing coarse echotexture of hepatic parenchyma is
noted suggesting diffuse hepatocellular disease. Common bile duct is
mildly dilated, but no intrahepatic dilatation is noted. Correlation
with liver function tests is recommended as well as possible MRCP to
evaluate for distal common bile duct obstruction.

## 2017-06-02 ENCOUNTER — Inpatient Hospital Stay (HOSPITAL_COMMUNITY): Payer: Medicare Other

## 2017-06-02 ENCOUNTER — Inpatient Hospital Stay (HOSPITAL_COMMUNITY): Payer: Medicare Other | Admitting: Anesthesiology

## 2017-06-02 ENCOUNTER — Encounter (HOSPITAL_COMMUNITY): Payer: Self-pay | Admitting: Obstetrics and Gynecology

## 2017-06-02 ENCOUNTER — Inpatient Hospital Stay (HOSPITAL_COMMUNITY)
Admission: EM | Admit: 2017-06-02 | Discharge: 2017-06-06 | DRG: 481 | Disposition: A | Discharge status: Skilled Nursing Facility | Payer: Medicare Other | Attending: Internal Medicine | Admitting: Internal Medicine

## 2017-06-02 ENCOUNTER — Emergency Department (HOSPITAL_COMMUNITY): Payer: Medicare Other

## 2017-06-02 ENCOUNTER — Encounter (HOSPITAL_COMMUNITY)
Admission: EM | Disposition: A | Discharge status: Skilled Nursing Facility | Payer: Self-pay | Source: Home / Self Care | Attending: Internal Medicine

## 2017-06-02 DIAGNOSIS — Z01818 Encounter for other preprocedural examination: Secondary | ICD-10-CM

## 2017-06-02 DIAGNOSIS — Z91048 Other nonmedicinal substance allergy status: Secondary | ICD-10-CM | POA: Diagnosis not present

## 2017-06-02 DIAGNOSIS — E039 Hypothyroidism, unspecified: Secondary | ICD-10-CM | POA: Diagnosis present

## 2017-06-02 DIAGNOSIS — Z940 Kidney transplant status: Secondary | ICD-10-CM

## 2017-06-02 DIAGNOSIS — M170 Bilateral primary osteoarthritis of knee: Secondary | ICD-10-CM | POA: Diagnosis present

## 2017-06-02 DIAGNOSIS — Z79899 Other long term (current) drug therapy: Secondary | ICD-10-CM | POA: Diagnosis not present

## 2017-06-02 DIAGNOSIS — M19021 Primary osteoarthritis, right elbow: Secondary | ICD-10-CM | POA: Diagnosis present

## 2017-06-02 DIAGNOSIS — Z89412 Acquired absence of left great toe: Secondary | ICD-10-CM | POA: Diagnosis not present

## 2017-06-02 DIAGNOSIS — Y92009 Unspecified place in unspecified non-institutional (private) residence as the place of occurrence of the external cause: Secondary | ICD-10-CM | POA: Diagnosis not present

## 2017-06-02 DIAGNOSIS — S72001A Fracture of unspecified part of neck of right femur, initial encounter for closed fracture: Principal | ICD-10-CM

## 2017-06-02 DIAGNOSIS — S72001C Fracture of unspecified part of neck of right femur, initial encounter for open fracture type IIIA, IIIB, or IIIC: Secondary | ICD-10-CM | POA: Diagnosis not present

## 2017-06-02 DIAGNOSIS — F329 Major depressive disorder, single episode, unspecified: Secondary | ICD-10-CM | POA: Diagnosis present

## 2017-06-02 DIAGNOSIS — Z888 Allergy status to other drugs, medicaments and biological substances status: Secondary | ICD-10-CM | POA: Diagnosis not present

## 2017-06-02 DIAGNOSIS — M479 Spondylosis, unspecified: Secondary | ICD-10-CM | POA: Diagnosis present

## 2017-06-02 DIAGNOSIS — Z794 Long term (current) use of insulin: Secondary | ICD-10-CM | POA: Diagnosis not present

## 2017-06-02 DIAGNOSIS — I10 Essential (primary) hypertension: Secondary | ICD-10-CM | POA: Diagnosis present

## 2017-06-02 DIAGNOSIS — D848 Other specified immunodeficiencies: Secondary | ICD-10-CM | POA: Diagnosis present

## 2017-06-02 DIAGNOSIS — R51 Headache: Secondary | ICD-10-CM | POA: Diagnosis present

## 2017-06-02 DIAGNOSIS — W010XXA Fall on same level from slipping, tripping and stumbling without subsequent striking against object, initial encounter: Secondary | ICD-10-CM | POA: Diagnosis present

## 2017-06-02 DIAGNOSIS — W19XXXA Unspecified fall, initial encounter: Secondary | ICD-10-CM

## 2017-06-02 DIAGNOSIS — Z87891 Personal history of nicotine dependence: Secondary | ICD-10-CM | POA: Diagnosis not present

## 2017-06-02 DIAGNOSIS — Z7982 Long term (current) use of aspirin: Secondary | ICD-10-CM | POA: Diagnosis not present

## 2017-06-02 DIAGNOSIS — D899 Disorder involving the immune mechanism, unspecified: Secondary | ICD-10-CM | POA: Diagnosis not present

## 2017-06-02 DIAGNOSIS — M19022 Primary osteoarthritis, left elbow: Secondary | ICD-10-CM | POA: Diagnosis present

## 2017-06-02 DIAGNOSIS — E119 Type 2 diabetes mellitus without complications: Secondary | ICD-10-CM

## 2017-06-02 DIAGNOSIS — D849 Immunodeficiency, unspecified: Secondary | ICD-10-CM | POA: Diagnosis present

## 2017-06-02 DIAGNOSIS — Z7952 Long term (current) use of systemic steroids: Secondary | ICD-10-CM | POA: Diagnosis not present

## 2017-06-02 DIAGNOSIS — S72009A Fracture of unspecified part of neck of unspecified femur, initial encounter for closed fracture: Secondary | ICD-10-CM | POA: Diagnosis present

## 2017-06-02 DIAGNOSIS — Z419 Encounter for procedure for purposes other than remedying health state, unspecified: Secondary | ICD-10-CM

## 2017-06-02 HISTORY — PX: HIP PINNING,CANNULATED: SHX1758

## 2017-06-02 LAB — BASIC METABOLIC PANEL
Anion gap: 9 (ref 5–15)
BUN: 26 mg/dL — ABNORMAL HIGH (ref 6–20)
CO2: 22 mmol/L (ref 22–32)
Calcium: 9.2 mg/dL (ref 8.9–10.3)
Chloride: 108 mmol/L (ref 101–111)
Creatinine, Ser: 1.19 mg/dL — ABNORMAL HIGH (ref 0.44–1.00)
GFR calc non Af Amer: 52 mL/min — ABNORMAL LOW (ref >60)
GLUCOSE: 269 mg/dL — AB (ref 65–99)
Potassium: 3.9 mmol/L (ref 3.5–5.1)
Sodium: 139 mmol/L (ref 135–145)

## 2017-06-02 LAB — CBC WITH DIFFERENTIAL/PLATELET
Basophils Absolute: 0 10*3/uL (ref 0.0–0.1)
Basophils Relative: 0 %
Eosinophils Absolute: 0.1 10*3/uL (ref 0.0–0.7)
Eosinophils Relative: 1 %
HEMATOCRIT: 33.8 % — AB (ref 36.0–46.0)
HEMOGLOBIN: 10.7 g/dL — AB (ref 12.0–15.0)
LYMPHS ABS: 1 10*3/uL (ref 0.7–4.0)
LYMPHS PCT: 13 %
MCH: 28.3 pg (ref 26.0–34.0)
MCHC: 31.7 g/dL (ref 30.0–36.0)
MCV: 89.4 fL (ref 78.0–100.0)
MONOS PCT: 8 %
Monocytes Absolute: 0.6 10*3/uL (ref 0.1–1.0)
NEUTROS ABS: 5.7 10*3/uL (ref 1.7–7.7)
NEUTROS PCT: 78 %
Platelets: 181 10*3/uL (ref 150–400)
RBC: 3.78 MIL/uL — AB (ref 3.87–5.11)
RDW: 13.8 % (ref 11.5–15.5)
WBC: 7.4 10*3/uL (ref 4.0–10.5)

## 2017-06-02 LAB — CBG MONITORING, ED: Glucose-Capillary: 241 mg/dL — ABNORMAL HIGH (ref 65–99)

## 2017-06-02 LAB — TYPE AND SCREEN
ABO/RH(D): O POS
ANTIBODY SCREEN: NEGATIVE

## 2017-06-02 LAB — GLUCOSE, CAPILLARY
GLUCOSE-CAPILLARY: 262 mg/dL — AB (ref 65–99)
Glucose-Capillary: 286 mg/dL — ABNORMAL HIGH (ref 65–99)

## 2017-06-02 LAB — SURGICAL PCR SCREEN
MRSA, PCR: NEGATIVE
Staphylococcus aureus: POSITIVE — AB

## 2017-06-02 LAB — PROTIME-INR
INR: 0.96
Prothrombin Time: 12.7 seconds (ref 11.4–15.2)

## 2017-06-02 LAB — I-STAT BETA HCG BLOOD, ED (MC, WL, AP ONLY): HCG, QUANTITATIVE: 9.5 m[IU]/mL — AB (ref <5)

## 2017-06-02 LAB — ABO/RH: ABO/RH(D): O POS

## 2017-06-02 SURGERY — FIXATION, FEMUR, NECK, PERCUTANEOUS, USING SCREW
Anesthesia: General | Site: Hip | Laterality: Right

## 2017-06-02 MED ORDER — ONDANSETRON HCL 4 MG/2ML IJ SOLN
4.0000 mg | Freq: Four times a day (QID) | INTRAMUSCULAR | Status: DC | PRN
  Administered 2017-06-03 – 2017-06-06 (×2): 4 mg via INTRAVENOUS
  Filled 2017-06-02 (×2): qty 2

## 2017-06-02 MED ORDER — MENTHOL 3 MG MT LOZG: 1.0000 | LOZENGE | OROMUCOSAL | Status: DC | PRN

## 2017-06-02 MED ORDER — MEPERIDINE HCL 50 MG/ML IJ SOLN: 6.2500 mg | INTRAMUSCULAR | Status: DC | PRN

## 2017-06-02 MED ORDER — FREESTYLE LIBRE READER DEVI
1.0000 | Freq: Four times a day (QID) | Status: DC
Start: 2017-06-02 — End: 2017-06-02

## 2017-06-02 MED ORDER — INSULIN ASPART 100 UNIT/ML ~~LOC~~ SOLN
SUBCUTANEOUS | Status: AC
  Filled 2017-06-02: qty 1

## 2017-06-02 MED ORDER — DEXAMETHASONE SODIUM PHOSPHATE 10 MG/ML IJ SOLN
INTRAMUSCULAR | Status: DC | PRN
  Administered 2017-06-02: 10 mg via INTRAVENOUS

## 2017-06-02 MED ORDER — CHLORHEXIDINE GLUCONATE 4 % EX LIQD: 60.0000 mL | Freq: Once | CUTANEOUS | Status: DC

## 2017-06-02 MED ORDER — ACETAMINOPHEN 650 MG RE SUPP: 650.0000 mg | Freq: Four times a day (QID) | RECTAL | Status: DC | PRN

## 2017-06-02 MED ORDER — TACROLIMUS 0.5 MG PO CAPS
0.5000 mg | ORAL_CAPSULE | ORAL | Status: DC
  Administered 2017-06-03 – 2017-06-06 (×4): 0.5 mg via ORAL
  Filled 2017-06-02 (×4): qty 1

## 2017-06-02 MED ORDER — DEXAMETHASONE SODIUM PHOSPHATE 10 MG/ML IJ SOLN
INTRAMUSCULAR | Status: AC
  Filled 2017-06-02: qty 1

## 2017-06-02 MED ORDER — ROCURONIUM BROMIDE 10 MG/ML (PF) SYRINGE
PREFILLED_SYRINGE | INTRAVENOUS | Status: DC | PRN
  Administered 2017-06-02 (×2): 10 mg via INTRAVENOUS
  Administered 2017-06-02: 30 mg via INTRAVENOUS

## 2017-06-02 MED ORDER — TACROLIMUS 1 MG PO CAPS
1.0000 mg | ORAL_CAPSULE | Freq: Every evening | ORAL | Status: DC
Start: 2017-06-02 — End: 2017-06-06
  Administered 2017-06-03 – 2017-06-05 (×3): 1 mg via ORAL
  Filled 2017-06-02 (×4): qty 1

## 2017-06-02 MED ORDER — CEFAZOLIN SODIUM-DEXTROSE 2-4 GM/100ML-% IV SOLN: 2.0000 g | INTRAVENOUS | Status: DC

## 2017-06-02 MED ORDER — ONDANSETRON HCL 4 MG PO TABS
4.0000 mg | ORAL_TABLET | Freq: Four times a day (QID) | ORAL | Status: DC | PRN
Start: 2017-06-02 — End: 2017-06-06
  Filled 2017-06-02: qty 1

## 2017-06-02 MED ORDER — INSULIN ASPART PROT & ASPART (70-30 MIX) 100 UNIT/ML ~~LOC~~ SUSP
20.0000 [IU] | Freq: Two times a day (BID) | SUBCUTANEOUS | Status: DC
  Administered 2017-06-03 – 2017-06-06 (×6): 20 [IU] via SUBCUTANEOUS
  Filled 2017-06-02: qty 10

## 2017-06-02 MED ORDER — ACETAMINOPHEN 325 MG PO TABS: 650.0000 mg | ORAL_TABLET | Freq: Four times a day (QID) | ORAL | Status: DC | PRN

## 2017-06-02 MED ORDER — FENTANYL CITRATE (PF) 100 MCG/2ML IJ SOLN
INTRAMUSCULAR | Status: DC | PRN
  Administered 2017-06-02 (×2): 50 ug via INTRAVENOUS

## 2017-06-02 MED ORDER — CEFAZOLIN SODIUM-DEXTROSE 2-4 GM/100ML-% IV SOLN
2.0000 g | INTRAVENOUS | Status: AC
  Administered 2017-06-02: 2 g via INTRAVENOUS

## 2017-06-02 MED ORDER — SUGAMMADEX SODIUM 200 MG/2ML IV SOLN
INTRAVENOUS | Status: AC
Start: 2017-06-02 — End: ?
  Filled 2017-06-02: qty 2

## 2017-06-02 MED ORDER — CARVEDILOL 25 MG PO TABS
50.0000 mg | ORAL_TABLET | Freq: Two times a day (BID) | ORAL | Status: DC
  Administered 2017-06-03 – 2017-06-06 (×7): 50 mg via ORAL
  Filled 2017-06-02 (×7): qty 2

## 2017-06-02 MED ORDER — PHENOL 1.4 % MT LIQD
1.0000 | OROMUCOSAL | Status: DC | PRN
  Filled 2017-06-02: qty 177

## 2017-06-02 MED ORDER — ENOXAPARIN SODIUM 40 MG/0.4ML ~~LOC~~ SOLN: 40.0000 mg | SUBCUTANEOUS | 0 refills | Status: DC

## 2017-06-02 MED ORDER — INSULIN ASPART 100 UNIT/ML ~~LOC~~ SOLN
0.0000 [IU] | SUBCUTANEOUS | Status: DC
  Administered 2017-06-02: 8 [IU] via SUBCUTANEOUS
  Administered 2017-06-03: 3 [IU] via SUBCUTANEOUS
  Administered 2017-06-03: 02:00:00 5 [IU] via SUBCUTANEOUS
  Administered 2017-06-03: 2 [IU] via SUBCUTANEOUS
  Administered 2017-06-03: 05:00:00 5 [IU] via SUBCUTANEOUS
  Administered 2017-06-03 – 2017-06-04 (×4): 2 [IU] via SUBCUTANEOUS
  Administered 2017-06-05: 3 [IU] via SUBCUTANEOUS
  Administered 2017-06-06: 5 [IU] via SUBCUTANEOUS
  Filled 2017-06-02 (×2): qty 1

## 2017-06-02 MED ORDER — MIDAZOLAM HCL 2 MG/2ML IJ SOLN
INTRAMUSCULAR | Status: AC
  Filled 2017-06-02: qty 2

## 2017-06-02 MED ORDER — FERROUS SULFATE 325 (65 FE) MG PO TABS
325.0000 mg | ORAL_TABLET | Freq: Three times a day (TID) | ORAL | Status: DC
  Administered 2017-06-03 – 2017-06-06 (×9): 325 mg via ORAL
  Filled 2017-06-02 (×9): qty 1

## 2017-06-02 MED ORDER — MORPHINE SULFATE (PF) 4 MG/ML IV SOLN
0.5000 mg | INTRAVENOUS | Status: DC | PRN
  Administered 2017-06-03 – 2017-06-05 (×12): 0.52 mg via INTRAVENOUS
  Filled 2017-06-02 (×12): qty 1

## 2017-06-02 MED ORDER — AMLODIPINE BESYLATE 10 MG PO TABS
10.0000 mg | ORAL_TABLET | Freq: Every day | ORAL | Status: DC
  Administered 2017-06-03 – 2017-06-05 (×3): 10 mg via ORAL
  Filled 2017-06-02 (×4): qty 1

## 2017-06-02 MED ORDER — HYDROMORPHONE HCL 1 MG/ML IJ SOLN
1.0000 mg | INTRAMUSCULAR | Status: AC | PRN
  Administered 2017-06-02: 1 mg via INTRAVENOUS
  Filled 2017-06-02: qty 1

## 2017-06-02 MED ORDER — HYDROMORPHONE HCL-NACL 0.5-0.9 MG/ML-% IV SOSY
0.2500 mg | PREFILLED_SYRINGE | INTRAVENOUS | Status: DC | PRN
  Administered 2017-06-02 (×2): 0.5 mg via INTRAVENOUS

## 2017-06-02 MED ORDER — HYDROCODONE-ACETAMINOPHEN 5-325 MG PO TABS
0.5000 | ORAL_TABLET | ORAL | Status: DC | PRN
  Administered 2017-06-03 – 2017-06-06 (×11): 1 via ORAL
  Filled 2017-06-02 (×12): qty 1

## 2017-06-02 MED ORDER — HYDRALAZINE HCL 20 MG/ML IJ SOLN
10.0000 mg | INTRAMUSCULAR | Status: DC | PRN
  Administered 2017-06-03: 16:00:00 10 mg via INTRAVENOUS
  Filled 2017-06-02: qty 1

## 2017-06-02 MED ORDER — CEFAZOLIN SODIUM-DEXTROSE 2-4 GM/100ML-% IV SOLN
2.0000 g | Freq: Four times a day (QID) | INTRAVENOUS | Status: AC
  Administered 2017-06-03 (×2): 2 g via INTRAVENOUS
  Filled 2017-06-02 (×2): qty 100

## 2017-06-02 MED ORDER — HYDROCODONE-ACETAMINOPHEN 5-325 MG PO TABS: 0.5000 | ORAL_TABLET | ORAL | 0 refills | Status: DC | PRN

## 2017-06-02 MED ORDER — ASPIRIN EC 81 MG PO TBEC: 81.0000 mg | DELAYED_RELEASE_TABLET | Freq: Every day | ORAL | Status: DC | PRN

## 2017-06-02 MED ORDER — ACETAMINOPHEN 325 MG PO TABS
650.0000 mg | ORAL_TABLET | Freq: Four times a day (QID) | ORAL | Status: DC | PRN
  Administered 2017-06-04: 325 mg via ORAL

## 2017-06-02 MED ORDER — SUCCINYLCHOLINE CHLORIDE 200 MG/10ML IV SOSY
PREFILLED_SYRINGE | INTRAVENOUS | Status: DC | PRN
  Administered 2017-06-02: 100 mg via INTRAVENOUS

## 2017-06-02 MED ORDER — LIDOCAINE 2% (20 MG/ML) 5 ML SYRINGE
INTRAMUSCULAR | Status: DC | PRN
  Administered 2017-06-02: 100 mg via INTRAVENOUS

## 2017-06-02 MED ORDER — MIDAZOLAM HCL 5 MG/5ML IJ SOLN
INTRAMUSCULAR | Status: DC | PRN
  Administered 2017-06-02: 2 mg via INTRAVENOUS

## 2017-06-02 MED ORDER — ACETAMINOPHEN 325 MG PO TABS
650.0000 mg | ORAL_TABLET | Freq: Four times a day (QID) | ORAL | Status: DC | PRN
  Filled 2017-06-02: qty 2

## 2017-06-02 MED ORDER — PROPOFOL 10 MG/ML IV BOLUS
INTRAVENOUS | Status: DC | PRN
  Administered 2017-06-02: 200 mg via INTRAVENOUS

## 2017-06-02 MED ORDER — LEVOTHYROXINE SODIUM 25 MCG PO TABS
137.0000 ug | ORAL_TABLET | Freq: Every day | ORAL | Status: DC
  Administered 2017-06-03 – 2017-06-06 (×4): 137 ug via ORAL
  Filled 2017-06-02 (×5): qty 1

## 2017-06-02 MED ORDER — SODIUM CHLORIDE 0.9 % IV SOLN
INTRAVENOUS | Status: DC
  Administered 2017-06-02 – 2017-06-04 (×5): via INTRAVENOUS

## 2017-06-02 MED ORDER — METOCLOPRAMIDE HCL 5 MG PO TABS: 5.0000 mg | ORAL_TABLET | Freq: Three times a day (TID) | ORAL | Status: DC | PRN

## 2017-06-02 MED ORDER — HYDROMORPHONE HCL 1 MG/ML IJ SOLN
0.5000 mg | INTRAMUSCULAR | Status: DC | PRN
  Administered 2017-06-02: 0.5 mg via INTRAVENOUS
  Filled 2017-06-02: qty 1

## 2017-06-02 MED ORDER — HYDROCORTISONE NA SUCCINATE PF 100 MG IJ SOLR
INTRAMUSCULAR | Status: AC
  Filled 2017-06-02: qty 2

## 2017-06-02 MED ORDER — SUGAMMADEX SODIUM 200 MG/2ML IV SOLN
INTRAVENOUS | Status: DC | PRN
  Administered 2017-06-02: 200 mg via INTRAVENOUS

## 2017-06-02 MED ORDER — LACTATED RINGERS IV SOLN
INTRAVENOUS | Status: DC
  Administered 2017-06-02 (×2): via INTRAVENOUS

## 2017-06-02 MED ORDER — PROPOFOL 10 MG/ML IV BOLUS
INTRAVENOUS | Status: AC
  Filled 2017-06-02: qty 20

## 2017-06-02 MED ORDER — ENOXAPARIN SODIUM 40 MG/0.4ML ~~LOC~~ SOLN
40.0000 mg | SUBCUTANEOUS | Status: DC
  Administered 2017-06-03 – 2017-06-05 (×3): 40 mg via SUBCUTANEOUS
  Filled 2017-06-02 (×4): qty 0.4

## 2017-06-02 MED ORDER — HYDROMORPHONE HCL-NACL 0.5-0.9 MG/ML-% IV SOSY
PREFILLED_SYRINGE | INTRAVENOUS | Status: AC
  Filled 2017-06-02: qty 1

## 2017-06-02 MED ORDER — PREDNISONE 5 MG PO TABS
5.0000 mg | ORAL_TABLET | Freq: Every day | ORAL | Status: DC
  Administered 2017-06-04 – 2017-06-05 (×3): 5 mg via ORAL
  Filled 2017-06-02 (×3): qty 1

## 2017-06-02 MED ORDER — MORPHINE SULFATE (PF) 2 MG/ML IV SOLN: 2.0000 mg | INTRAVENOUS | Status: DC | PRN

## 2017-06-02 MED ORDER — MYCOPHENOLATE MOFETIL 250 MG PO CAPS
500.0000 mg | ORAL_CAPSULE | Freq: Two times a day (BID) | ORAL | Status: DC
  Administered 2017-06-03 – 2017-06-05 (×6): 500 mg via ORAL
  Filled 2017-06-02 (×9): qty 2

## 2017-06-02 MED ORDER — METOCLOPRAMIDE HCL 5 MG/ML IJ SOLN: 5.0000 mg | Freq: Three times a day (TID) | INTRAMUSCULAR | Status: DC | PRN

## 2017-06-02 MED ORDER — FENTANYL CITRATE (PF) 100 MCG/2ML IJ SOLN
INTRAMUSCULAR | Status: AC
  Filled 2017-06-02: qty 2

## 2017-06-02 MED ORDER — CEFAZOLIN SODIUM-DEXTROSE 2-4 GM/100ML-% IV SOLN
INTRAVENOUS | Status: AC
  Filled 2017-06-02: qty 100

## 2017-06-02 SURGICAL SUPPLY — 39 items
BAG ZIPLOCK 12X15 (MISCELLANEOUS) ×3 IMPLANT
BIT DRILL 4.8X300 (BIT) ×2 IMPLANT
BIT DRILL 4.8X300MM (BIT) ×1
BNDG COHESIVE 4X5 TAN STRL (GAUZE/BANDAGES/DRESSINGS) IMPLANT
BNDG GAUZE ELAST 4 BULKY (GAUZE/BANDAGES/DRESSINGS) ×3 IMPLANT
CLOSURE WOUND 1/2 X4 (GAUZE/BANDAGES/DRESSINGS)
COVER SURGICAL LIGHT HANDLE (MISCELLANEOUS) ×3 IMPLANT
DRAPE BACK TABLE (DRAPES) IMPLANT
DRAPE STERI IOBAN 125X83 (DRAPES) ×3 IMPLANT
DRAPE SURG 17X11 SM STRL (DRAPES) IMPLANT
DRSG EMULSION OIL 3X16 NADH (GAUZE/BANDAGES/DRESSINGS) IMPLANT
DRSG MEPILEX BORDER 4X4 (GAUZE/BANDAGES/DRESSINGS) ×3 IMPLANT
DRSG PAD ABDOMINAL 8X10 ST (GAUZE/BANDAGES/DRESSINGS) IMPLANT
ELECT REM PT RETURN 15FT ADLT (MISCELLANEOUS) ×3 IMPLANT
GAUZE SPONGE 4X4 12PLY STRL (GAUZE/BANDAGES/DRESSINGS) IMPLANT
GLOVE ORTHO TXT STRL SZ7.5 (GLOVE) ×3 IMPLANT
GLOVE SURG ORTHO 8.5 STRL (GLOVE) ×3 IMPLANT
GOWN STRL REUS W/TWL LRG LVL3 (GOWN DISPOSABLE) ×6 IMPLANT
KIT BASIN OR (CUSTOM PROCEDURE TRAY) ×3 IMPLANT
MANIFOLD NEPTUNE II (INSTRUMENTS) ×3 IMPLANT
NS IRRIG 1000ML POUR BTL (IV SOLUTION) ×3 IMPLANT
PACK GENERAL/GYN (CUSTOM PROCEDURE TRAY) ×3 IMPLANT
PIN GUIDE 3.2 (PIN) ×3 IMPLANT
PIN GUIDE DRILL TIP 2.8X300 (DRILL) ×12 IMPLANT
POSITIONER SURGICAL ARM (MISCELLANEOUS) ×6 IMPLANT
SCREW CANN 6.5MMX100MMX16MM ×3 IMPLANT
SCREW CANN 6.5X90 (Screw) ×1 IMPLANT
SCREW CANN 6.5X90 16MM THD (Screw) ×1 IMPLANT
SCREW CANNULATED 6.5X90MM (Screw) ×2 IMPLANT
SCREW CANNULATED 8.0X110 HIP (Screw) ×3 IMPLANT
STAPLER VISISTAT (STAPLE) ×3 IMPLANT
STAPLER VISISTAT 35W (STAPLE) IMPLANT
STRIP CLOSURE SKIN 1/2X4 (GAUZE/BANDAGES/DRESSINGS) IMPLANT
SUT MNCRL AB 4-0 PS2 18 (SUTURE) ×3 IMPLANT
SUT VIC AB 1 CT1 36 (SUTURE) ×6 IMPLANT
SUT VIC AB 2-0 CT1 27 (SUTURE) ×4
SUT VIC AB 2-0 CT1 TAPERPNT 27 (SUTURE) ×2 IMPLANT
TOWEL OR 17X26 10 PK STRL BLUE (TOWEL DISPOSABLE) ×6 IMPLANT
WATER STERILE IRR 1500ML POUR (IV SOLUTION) ×3 IMPLANT

## 2017-06-02 NOTE — ED Provider Notes (Signed)
[facility]-EMERGENCY DEPT Provider Note   CSN: [id number] Arrival date & time: 06/02/17  1257     History   Chief Complaint Chief Complaint  Patient presents with  . Fall  . Hip Pain    HPI [name] is a 51 y.o. female.  HPI Patient presents after mechanical fall. Patient was in her usual state of health, which she acknowledges as [place] baseline functional, when she had a fall just prior to calling EMS. Patient recalls losing balance, falling onto her right side. Since that time there has been severe sore pain throughout the right hip. Patient has been nonambulatory, is unwilling to move the hip secondary to pain. She denies other substantial areas of contact, pain in other areas. She also denies any loss of sensation distally any abdominal pain, any incontinence. No medication taken for pain relief Past Medical History:  Diagnosis Date  . Arthritis    "elbows, knees, legs, back" (09/24/2015)  . CKD (chronic kidney disease)   . Daily headache   . Depression    "years ago"  . End stage renal disease (HCC)    right arm AV graft, resolved post transplant  . Hypertension   . Hypothyroid   . 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 Kidney disease 2011  . Pneumonia ~ 2007?  [name] Type II diabetes mellitus (HCC)    Insulin dependant    Patient Active Problem List   Diagnosis Date Noted  . Hyperglycemia 05/09/2017  . Lower extremity cellulitis 03/23/2017  . Type II diabetes mellitus (HCC) 03/23/2017  . Anemia 01/26/2017  . Abnormal LFTs 01/26/2017  . DKA (diabetic ketoacidoses) (HCC) 08/12/2016  . Hypoglycemia   . Wound cellulitis   . CKD (chronic kidney disease) stage 3, GFR 30-59 ml/min 06/26/2016  . Cellulitis of right lower extremity   . QT prolongation 05/13/2016  . UTI (lower urinary tract infection)   . Hypomagnesemia   . Pressure ulcer 04/11/2016  . Acute encephalopathy 04/11/2016  . Altered mental status 04/10/2016  . Insulin  dependent diabetes mellitus (HCC) 04/10/2016  . Diabetic ketoacidosis without coma associated with type 2 diabetes mellitus (HCC) 11/02/2015  . Uncontrolled type 1 diabetes mellitus with foot ulcer (HCC) 06/04/2015  . Foot ulcer, right (HCC)   . Diarrhea 06/02/2015  . Type 1 diabetes mellitus with diabetic foot ulcer (HCC) 06/02/2015  . Acute renal failure superimposed on stage 3 chronic kidney disease (HCC) 06/02/2015  . Diabetic foot infection (HCC) 06/01/2015  . Essential hypertension 06/01/2015  . GERD (gastroesophageal reflux disease) 06/01/2015  . Hypotension 03/22/2014  . H/O kidney transplant 08/04/2012  . Sepsis (HCC) 08/04/2012  . Hyperkalemia 09/22/2011  . Hyponatremia 09/22/2011  . Immunosuppression (HCC)   . Hypothyroidism     Past Surgical History:  Procedure Laterality Date  . AMPUTATION Left 05/11/2014   Procedure: AMPUTATION LEFT GREAT TOE;  Surgeon: Kathryne Hitch, MD;  Location: [facility];  Service: Orthopedics;  Laterality: Left;  . AV FISTULA PLACEMENT Right    forearm  . BACK SURGERY    . CATARACT EXTRACTION W/ INTRAOCULAR LENS  IMPLANT, BILATERAL Bilateral   . CESAREAN SECTION  07/1999  . DG AV DIALYSIS GRAFT DECLOT OR    . INCISION AND DRAINAGE Right 06/02/2015   Procedure: INCISION AND DRAINAGE OF RIGHT 3rd RAY RESECTION;  Surgeon: Kathryne Hitch, MD;  Location: [facility] OR;  Service: Orthopedics;  Laterality: Right;  . 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 LUMBAR DISC  SURGERY  2001  . NEPHRECTOMY TRANSPLANTED ORGAN    . TOE AMPUTATION Right    1,2 & 3rd toes.  . TUBAL LIGATION  07/1999    OB History    Gravida Para Term Preterm AB Living             1   SAB TAB Ectopic Multiple Live Births                   Home Medications    Prior to Admission medications   Medication Sig Start Date End Date Taking? Authorizing Provider  acetaminophen (TYLENOL) 325 MG tablet Take 2 tablets (650 mg total) by mouth every 6 (six) hours as needed for  mild pain, moderate pain, fever or headache. 05/15/16   Hongalgi, Maximino Greenland, MD  amLODipine (NORVASC) 10 MG tablet Take 1 tablet (10 mg total) by mouth daily. 08/14/16   Regalado, Jon Billings A, MD  aspirin EC 81 MG tablet Take 81 mg by mouth daily.    [provider]  carvedilol (COREG) 25 MG tablet Take 50 mg by mouth 2 (two) times daily. 02/26/17   [provider]  Continuous Blood Gluc Receiver (FREESTYLE LIBRE READER) DEVI 1 each by Does not apply route 4 (four) times daily. 04/29/17   Leatha Gilding, MD  Continuous Blood Gluc Sensor (FREESTYLE LIBRE SENSOR SYSTEM) MISC 1 sensor every 10 days 04/29/17   Leatha Gilding, MD  doxycycline (VIBRA-TABS) 100 MG tablet Take 1 tablet (100 mg total) by mouth every 12 (twelve) hours. 04/29/17   Leatha Gilding, MD  hydrALAZINE (APRESOLINE) 25 MG tablet Take 1 tablet (25 mg total) by mouth every 12 (twelve) hours. 04/29/17 05/29/17  Leatha Gilding, MD  insulin NPH-regular Human (NOVOLIN 70/30) (70-30) 100 UNIT/ML injection Inject 20 Units into the skin 2 (two) times daily with a meal. 04/14/17   Amin, Loura Halt, MD  mycophenolate (CELLCEPT) 250 MG capsule Take 500 mg by mouth 2 (two) times daily.     [provider]  predniSONE (DELTASONE) 5 MG tablet Take 5 mg by mouth at bedtime.  05/29/15   [provider]  SYNTHROID 137 MCG tablet Take 137 mcg by mouth daily. 04/10/17   [provider]  tacrolimus (PROGRAF) 0.5 MG capsule Take 0.5-1 mg by mouth 2 (two) times daily. Take 0.5mg  by mouth in the morning and  by mouth at bedtime    [provider]  vitamin B-12 (CYANOCOBALAMIN) 100 MCG tablet Take 1 tablet (100 mcg total) by mouth daily. Patient not taking: Reported on 05/09/2017 03/29/17 03/29/18  Alba Cory, MD    Family History Family History  Problem Relation Age of Onset  . Emphysema Mother   . Throat cancer Mother   . COPD Mother   . Cancer Mother   . Emphysema Father   . COPD Father   .  Stroke Father   . ADD / ADHD Son     Social History Social History  Substance Use Topics  . Smoking status: Former Smoker    Packs/day: 1.00    Years: 11.00    Types: Cigarettes    Quit date: 09/21/1998  . Smokeless tobacco: Never Used  . Alcohol use No     Allergies   Ace inhibitors; Adhesive [tape]; and Lisinopril   Review of Systems Review of Systems  Constitutional:       Per HPI, otherwise negative  HENT:       Per HPI, otherwise negative  Respiratory:       Per HPI, otherwise negative  Cardiovascular:       Per HPI, otherwise negative  Gastrointestinal: Negative for vomiting.  Endocrine:       Negative aside from HPI  Genitourinary:       Neg aside from HPI   Musculoskeletal:       Per HPI, otherwise negative  Skin: Negative.   Allergic/Immunologic: Positive for immunocompromised state.       History of renal transplant, labile diabetes.  Neurological: Negative for syncope.     Physical Exam Updated Vital Signs BP (!) 170/76 (BP Location: Left Arm)   Pulse 92   Temp 97.7 F (36.5 C) (Oral)   Resp 16   Ht 5' 8.5" (1.74 m)   Wt 84.8 kg (187 lb)   LMP 11/19/2011   SpO2 97%   BMI 28.02 kg/m   Physical Exam  Constitutional: She is oriented to person, place, and time. She appears well-developed and well-nourished. No distress.  Obese, unwell-appearing female awake and alert.  HENT:  Head: Normocephalic and atraumatic.  Eyes: Conjunctivae and EOM are normal.  Cardiovascular: Normal rate and regular rhythm.   Pulmonary/Chest: Effort normal and breath sounds normal. No stridor. No respiratory distress.  Abdominal: She exhibits no distension.  Musculoskeletal: She exhibits no edema.       Legs: Neurological: She is alert and oriented to person, place, and time. No cranial nerve deficit.  Skin: Skin is warm and dry.  Psychiatric: She has a normal mood and affect.  Nursing note and vitals reviewed.    ED Treatments / Results  Labs (all labs  ordered are listed, but only abnormal results are displayed) Labs Reviewed  BASIC METABOLIC PANEL - Abnormal; Notable for the following:       Result Value   Glucose, Bld 269 (*)    BUN 26 (*)    Creatinine, Ser 1.19 (*)    GFR calc non Af Amer 52 (*)    All other components within normal limits  CBC WITH DIFFERENTIAL/PLATELET - Abnormal; Notable for the following:    RBC 3.78 (*)    Hemoglobin 10.7 (*)    HCT 33.8 (*)    All other components within normal limits  I-STAT BETA HCG BLOOD, ED ([facility], [facility], [facility] ONLY) - Abnormal; Notable for the following:    I-stat hCG, quantitative 9.5 (*)    All other components within normal limits  PROTIME-INR  TYPE AND SCREEN  ABO/RH    EKG  EKG Interpretation  Date/Time:  Wednesday June 02 2017 13:30:30 EDT Ventricular Rate:  90 PR Interval:    QRS Duration: 137 QT Interval:  423 QTC Calculation: 518 R Axis:   175 Text Interpretation:  Sinus rhythm Right bundle branch block Nonspecific T abnormalities, lateral leads Baseline wander in lead(s) II No significant change since last tracing Abnormal ekg Confirmed by Gerhard Munch [phone]) on 06/02/2017 1:42:10 PM       Radiology Dg Hip Unilat With Pelvis 2-3 Views Right  Result Date: 06/02/2017 CLINICAL DATA:  Fall with right hip pain EXAM: DG HIP (WITH OR WITHOUT PELVIS) 2-3V RIGHT COMPARISON:  03/26/2017 FINDINGS: Acute right femoral neck fracture at the base of the femoral neck. No dislocation of the femoral head. Pubic symphysis is intact. Old deformity of the right superior pubic ramus. Vascular calcification IMPRESSION: Acute right femoral neck fracture. Electronically Signed   By: Jasmine Pang M.D.   On: 06/02/2017 14:34    Procedures Procedures (  including critical care time)  Medications Ordered in ED Medications  HYDROmorphone (DILAUDID) injection 0.5 mg (0.5 mg Intravenous Given 06/02/17 1348)     Initial Impression / Assessment and Plan / ED Course  I have reviewed the  triage vital signs and the nursing notes.  Pertinent labs & imaging results that were available during my care of the patient were reviewed by me and considered in my medical decision making (see chart for details).  3:23 PM I reviewed the patient's x-ray, concerning for femoral neck fracture. On repeat exam the patient is awake and alert, but has pain in the area. Subsequently I discussed patient's case with our orthopedist on call, Dr. Ranell Patrick. He will assist with managing the patient's care. Patient is aware of all findings, and given concern for new femoral neck fracture, the patient requires admission for further evaluation and management.  Final Clinical Impressions(s) / ED Diagnoses  Fall, initial encounter Femoral neck fracture, right, initial encounter   Gerhard Munch, MD 06/02/17 1524

## 2017-06-02 NOTE — Anesthesia Procedure Notes (Signed)
Procedure Name: Intubation Date/Time: 06/02/2017 7:45 PM Performed by: Lind Covert Pre-anesthesia Checklist: Patient identified, Emergency Drugs available, Suction available, Patient being monitored and Timeout performed Patient Re-evaluated:Patient Re-evaluated prior to induction Oxygen Delivery Method: Circle system utilized Preoxygenation: Pre-oxygenation with 100% oxygen Induction Type: IV induction Laryngoscope Size: Mac and 4 Grade View: Grade I Tube type: Oral Tube size: 7.0 mm Number of attempts: 1 Airway Equipment and Method: Stylet Placement Confirmation: ETT inserted through vocal cords under direct vision,  positive ETCO2 and breath sounds checked- equal and bilateral Secured at: 22 cm Tube secured with: Tape Dental Injury: Teeth and Oropharynx as per pre-operative assessment

## 2017-06-02 NOTE — Brief Op Note (Signed)
06/02/2017  9:33 PM  PATIENT:  [name]  51 y.o. female  PRE-OPERATIVE DIAGNOSIS:  Minimally displaced femoral neck fracture  POST-OPERATIVE DIAGNOSIS:  Minimally displaced femoral neck fracture  PROCEDURE:  Procedure(s): CANNULATED RIGHT HIP PINNING (Right)  Closed reduction Biomet 8.0 and 6.5 cannulated screws  SURGEON:  Surgeon(s) and Role:    Beverely Low, MD - Primary  PHYSICIAN ASSISTANT:   ASSISTANTS: Thea Gist, PA-C   ANESTHESIA:   general  EBL:  Total I/O In: 1400 [I.V.:1400] Out: 100 [Blood:100]  BLOOD ADMINISTERED:none  DRAINS: none   LOCAL MEDICATIONS USED:  NONE  SPECIMEN:  No Specimen  DISPOSITION OF SPECIMEN:  N/A  COUNTS:  YES  TOURNIQUET:  * No tourniquets in log *  DICTATION: .Other Dictation: Dictation Number [id number]  PLAN OF CARE: Admit to inpatient   PATIENT DISPOSITION:  PACU - hemodynamically stable.   Delay start of Pharmacological VTE agent (>24hrs) due to surgical blood loss or risk of bleeding: no

## 2017-06-02 NOTE — Anesthesia Postprocedure Evaluation (Signed)
Anesthesia Post Note  Patient: [name]  Procedure(s) Performed: Procedure(s) (LRB): CANNULATED RIGHT HIP PINNING (Right)     Patient location during evaluation: PACU Anesthesia Type: General Level of consciousness: awake and alert Pain management: pain level controlled Vital Signs Assessment: post-procedure vital signs reviewed and stable Respiratory status: spontaneous breathing, nonlabored ventilation, respiratory function stable and patient connected to nasal cannula oxygen Cardiovascular status: blood pressure returned to baseline and stable Postop Assessment: no apparent nausea or vomiting Anesthetic complications: no    Last Vitals:  Vitals:   06/02/17 2200 06/02/17 2215  BP: (!) 193/87 (!) 188/87  Pulse: 91 92  Resp: 10 10  Temp:    SpO2: 97% 92%    Last Pain:  Vitals:   06/02/17 2215  TempSrc:   PainSc: Asleep                 Charmine Bockrath DAVID

## 2017-06-02 NOTE — Transfer of Care (Signed)
Immediate Anesthesia Transfer of Care Note  Patient: [name]  Procedure(s) Performed: Procedure(s): CANNULATED RIGHT HIP PINNING (Right)  Patient Location: PACU  Anesthesia Type:General  Level of Consciousness: sedated  Airway & Oxygen Therapy: Patient Spontanous Breathing and Patient connected to face mask oxygen  Post-op Assessment: Report given to RN and Post -op Vital signs reviewed and stable  Post vital signs: Reviewed and stable  Last Vitals:  Vitals:   06/02/17 1700 06/02/17 1745  BP: (!) 183/86 (!) 195/97  Pulse: 99 100  Resp: 16 14  Temp:    SpO2: 95% 94%    Last Pain:  Vitals:   06/02/17 1747  TempSrc:   PainSc: 9       Patients Stated Pain Goal: 4 (06/02/17 1745)  Complications: No apparent anesthesia complications

## 2017-06-02 NOTE — Anesthesia Preprocedure Evaluation (Addendum)
Anesthesia Evaluation  Patient identified by MRN, date of birth, ID band Patient awake    Reviewed: Allergy & Precautions, NPO status , Patient's Chart, lab work & pertinent test results  Airway Mallampati: II  TM Distance: >3 FB Neck ROM: Full    Dental   Pulmonary former smoker,    Pulmonary exam normal        Cardiovascular hypertension, Pt. on medications Normal cardiovascular exam     Neuro/Psych Depression    GI/Hepatic GERD  Medicated and Controlled,  Endo/Other  diabetes, Type 2, Oral Hypoglycemic Agents  Renal/GU Renal InsufficiencyRenal diseaseRenal Transplant     Musculoskeletal   Abdominal   Peds  Hematology   Anesthesia Other Findings   Reproductive/Obstetrics                            Anesthesia Physical Anesthesia Plan  ASA: III  Anesthesia Plan: General   Post-op Pain Management:    Induction: Intravenous  PONV Risk Score and Plan: 3 and Ondansetron, Dexamethasone and Midazolam  Airway Management Planned: Oral ETT  Additional Equipment:   Intra-op Plan:   Post-operative Plan: Extubation in OR  Informed Consent: I have reviewed the patients History and Physical, chart, labs and discussed the procedure including the risks, benefits and alternatives for the proposed anesthesia with the patient or authorized representative who has indicated his/her understanding and acceptance.     Plan Discussed with: CRNA and Surgeon  Anesthesia Plan Comments:         Anesthesia Quick Evaluation

## 2017-06-02 NOTE — H&P (Addendum)
History and Physical    [name]:[id number] DOB: 08-30-1966 DOA: 06/02/2017  PCP: Dois Davenport, MD  Patient coming from: Home  Chief Complaint: Fall, hip pain  HPI: [name] is a 51 y.o. female with medical history significant of ESRD s/p renal transplant, diabetes, hypertension, depression who presented to the hospital following a mechanical fall after tripping over her cord. This resulted in severe right hip pain with difficulty ambulating. Patient is also present to the [facility] department for further workup.  ED Course: In the emergency department, patient noted to have an acute right femoral neck fracture on x-ray. Orthopedic surgery consulted with recommendations for inpatient stay, likely surgery. Hospital service consulted for consideration for admission.  Review of Systems:  Review of Systems  Constitutional: Negative for chills, fever, malaise/fatigue and weight loss.  HENT: Negative for congestion, ear discharge, ear pain and nosebleeds.   Eyes: Negative for double vision, photophobia and pain.  Respiratory: Negative for hemoptysis, sputum production and shortness of breath.   Cardiovascular: Negative for palpitations, orthopnea and claudication.  Gastrointestinal: Negative for abdominal pain, nausea and vomiting.  Genitourinary: Negative for frequency, hematuria and urgency.  Musculoskeletal: Negative for back pain, joint pain and neck pain.  Neurological: Negative for tingling, tremors and loss of consciousness.  Psychiatric/Behavioral: Negative for hallucinations, memory loss and substance abuse. The patient is not nervous/anxious.     Past Medical History:  Diagnosis Date  . Arthritis    "elbows, knees, legs, back" (09/24/2015)  . CKD (chronic kidney disease)   . Daily headache   . Depression    "years ago"  . End stage renal disease (HCC)    right arm AV graft, resolved post transplant  . Hypertension   . Hypothyroid   . 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 Kidney disease 2011  . Pneumonia ~ 2007?  [name] Type II diabetes mellitus (HCC)    Insulin dependant    Past Surgical History:  Procedure Laterality Date  . AMPUTATION Left 05/11/2014   Procedure: AMPUTATION LEFT GREAT TOE;  Surgeon: Kathryne Hitch, MD;  Location: [facility];  Service: Orthopedics;  Laterality: Left;  . AV FISTULA PLACEMENT Right    forearm  . BACK SURGERY    . CATARACT EXTRACTION W/ INTRAOCULAR LENS  IMPLANT, BILATERAL Bilateral   . CESAREAN SECTION  07/1999  . DG AV DIALYSIS GRAFT DECLOT OR    . INCISION AND DRAINAGE Right 06/02/2015   Procedure: INCISION AND DRAINAGE OF RIGHT 3rd RAY RESECTION;  Surgeon: Kathryne Hitch, MD;  Location: [facility] OR;  Service: Orthopedics;  Laterality: Right;  . 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 LUMBAR DISC SURGERY  2001  . NEPHRECTOMY TRANSPLANTED ORGAN    . TOE AMPUTATION Right    1,2 & 3rd toes.  . TUBAL LIGATION  07/1999     reports that she quit smoking about 18 years ago. Her smoking use included Cigarettes. She has a 11.00 pack-year smoking history. She has never used smokeless tobacco. She reports that she does not drink alcohol or use drugs.  Allergies  Allergen Reactions  . Ace Inhibitors Cough  . Adhesive [Tape] Itching and Other (See Comments)    Please use paper tape  . Lisinopril Cough    Family History  Problem Relation Age of Onset  . Emphysema Mother   . Throat cancer Mother   . COPD Mother   . Cancer Mother   . Emphysema Father   .  COPD Father   . Stroke Father   . ADD / ADHD Son     Prior to Admission medications   Medication Sig Start Date End Date Taking? Authorizing Provider  acetaminophen (TYLENOL) 325 MG tablet Take 2 tablets (650 mg total) by mouth every 6 (six) hours as needed for mild pain, moderate pain, fever or headache. 05/15/16  Yes Hongalgi, Maximino Greenland, MD  amLODipine (NORVASC) 10 MG tablet Take 1 tablet (10 mg total) by mouth daily.  08/14/16  Yes Regalado, Belkys A, MD  aspirin EC 81 MG tablet Take 81 mg by mouth daily as needed for moderate pain.    Yes [provider]  carvedilol (COREG) 25 MG tablet Take 50 mg by mouth 2 (two) times daily. 02/26/17  Yes [provider]  insulin NPH-regular Human (NOVOLIN 70/30) (70-30) 100 UNIT/ML injection Inject 20 Units into the skin 2 (two) times daily with a meal. 04/14/17  Yes Amin, Ankit Chirag, MD  mycophenolate (CELLCEPT) 250 MG capsule Take 500 mg by mouth 2 (two) times daily.    Yes [provider]  predniSONE (DELTASONE) 5 MG tablet Take 5 mg by mouth at bedtime.  05/29/15  Yes [provider]  PROGRAF 1 MG capsule Take 1 mg by mouth every evening.  04/12/17  Yes [provider]  SYNTHROID 137 MCG tablet Take 137 mcg by mouth daily. 04/10/17  Yes [provider]  tacrolimus (PROGRAF) 0.5 MG capsule Take 0.5 mg by mouth every morning.    Yes [provider]  Continuous Blood Gluc Receiver (FREESTYLE LIBRE READER) DEVI 1 each by Does not apply route 4 (four) times daily. 04/29/17   Leatha Gilding, MD  Continuous Blood Gluc Sensor (FREESTYLE LIBRE SENSOR SYSTEM) MISC 1 sensor every 10 days 04/29/17   Leatha Gilding, MD  doxycycline (VIBRA-TABS) 100 MG tablet Take 1 tablet (100 mg total) by mouth every 12 (twelve) hours. Patient not taking: Reported on 06/02/2017 04/29/17   Leatha Gilding, MD  hydrALAZINE (APRESOLINE) 25 MG tablet Take 1 tablet (25 mg total) by mouth every 12 (twelve) hours. 04/29/17 05/29/17  Leatha Gilding, MD  vitamin B-12 (CYANOCOBALAMIN) 100 MCG tablet Take 1 tablet (100 mcg total) by mouth daily. Patient not taking: Reported on 05/09/2017 03/29/17 03/29/18  Alba Cory, MD    Physical Exam: Vitals:   06/02/17 1315 06/02/17 1316 06/02/17 1317  BP: (!) 167/74 (!) 170/76   Pulse: 89 92   Resp: 15 16   Temp: 97.7 F (36.5 C) 97.7 F (36.5 C)   TempSrc: Oral Oral   SpO2: 97% 97%   Weight:    84.8 kg (187 lb)  Height:   5' 8.5" (1.74 m)    Constitutional: NAD, calm, comfortable Vitals:   06/02/17 1315 06/02/17 1316 06/02/17 1317  BP: (!) 167/74 (!) 170/76   Pulse: 89 92   Resp: 15 16   Temp: 97.7 F (36.5 C) 97.7 F (36.5 C)   TempSrc: Oral Oral   SpO2: 97% 97%   Weight:   84.8 kg (187 lb)  Height:   5' 8.5" (1.74 m)   Eyes: PERRL, lids and conjunctivae normal ENMT: Mucous membranes are moist. Posterior pharynx clear of any exudate or lesions.Normal dentition.  Neck: normal, supple, no masses, no thyromegaly Respiratory: clear to auscultation bilaterally, no wheezing, no crackles. Normal respiratory effort. No accessory muscle use.  Cardiovascular: Regular rate and rhythm, no murmurs / rubs / gallops. No extremity edema. 2+ pedal  pulses. No carotid bruits.  Abdomen: no tenderness, no masses palpated. No hepatosplenomegaly. Bowel sounds positive.  Musculoskeletal: no clubbing / cyanosis. No joint deformity upper and lower extremities. Good ROM, no contractures. Normal muscle tone.  Skin: no rashes, lesions, ulcers. No induration Neurologic: CN 2-12 grossly intact. Sensation intact, DTR normal. Strength 5/5 in all 4.  Psychiatric: Normal judgment and insight. Alert and oriented x 3. Normal mood.    Labs on Admission: I have personally reviewed following labs and imaging studies  CBC:  Recent Labs Lab 05/28/17 0229 06/02/17 1324  WBC 4.1 7.4  NEUTROABS 2.6 5.7  HGB 11.4* 10.7*  HCT 35.0* 33.8*  MCV 88.4 89.4  PLT 198 181   Basic Metabolic Panel:  Recent Labs Lab 05/28/17 0229 06/02/17 1324  NA 137 139  K 3.8 3.9  CL 106 108  CO2 23 22  GLUCOSE 136* 269*  BUN 22* 26*  CREATININE 1.54* 1.19*  CALCIUM 10.0 9.2   GFR: Estimated Creatinine Clearance: 65.2 mL/min (A) (by C-G formula based on SCr of 1.19 mg/dL (H)). Liver Function Tests: No results for input(s): AST, ALT, ALKPHOS, BILITOT, PROT, ALBUMIN in the last 168 hours. No results for input(s):  LIPASE, AMYLASE in the last 168 hours. No results for input(s): AMMONIA in the last 168 hours. Coagulation Profile:  Recent Labs Lab 06/02/17 1324  INR 0.96   Cardiac Enzymes: No results for input(s): CKTOTAL, CKMB, CKMBINDEX, TROPONINI in the last 168 hours. BNP (last 3 results) No results for input(s): PROBNP in the last 8760 hours. HbA1C: No results for input(s): HGBA1C in the last 72 hours. CBG:  Recent Labs Lab 05/28/17 0300  GLUCAP 135*   Lipid Profile: No results for input(s): CHOL, HDL, LDLCALC, TRIG, CHOLHDL, LDLDIRECT in the last 72 hours. Thyroid Function Tests: No results for input(s): TSH, T4TOTAL, FREET4, T3FREE, THYROIDAB in the last 72 hours. Anemia Panel: No results for input(s): VITAMINB12, FOLATE, FERRITIN, TIBC, IRON, RETICCTPCT in the last 72 hours. Urine analysis:    Component Value Date/Time   COLORURINE STRAW (A) 05/09/2017 0920   APPEARANCEUR CLEAR 05/09/2017 0920   LABSPEC 1.016 05/09/2017 0920   PHURINE 5.0 05/09/2017 0920   GLUCOSEU >=500 (A) 05/09/2017 0920   HGBUR SMALL (A) 05/09/2017 0920   BILIRUBINUR NEGATIVE 05/09/2017 0920   KETONESUR NEGATIVE 05/09/2017 0920   PROTEINUR 100 (A) 05/09/2017 0920   UROBILINOGEN 0.2 03/02/2015 1153   NITRITE NEGATIVE 05/09/2017 0920   LEUKOCYTESUR NEGATIVE 05/09/2017 0920   Sepsis Labs: !!!!!!!!!!!!!!!!!!!!!!!!!!!!!!!!!!!!!!!!!!!! (procalcitonin:4,lacticidven:4) )No results found for this or any previous visit (from the past 240 hour(s)).   Radiological Exams on Admission: Dg Hip Unilat With Pelvis 2-3 Views Right  Result Date: 06/02/2017 CLINICAL DATA:  Fall with right hip pain EXAM: DG HIP (WITH OR WITHOUT PELVIS) 2-3V RIGHT COMPARISON:  03/26/2017 FINDINGS: Acute right femoral neck fracture at the base of the femoral neck. No dislocation of the femoral head. Pubic symphysis is intact. Old deformity of the right superior pubic ramus. Vascular calcification IMPRESSION: Acute right femoral  neck fracture. Electronically Signed   By: Jasmine Pang M.D.   On: 06/02/2017 14:34    EKG: Independently reviewed. Sinus  Assessment/Plan Principal Problem:   Closed displaced fracture of right femoral neck (HCC) Active Problems:   Immunosuppression (HCC)   Essential hypertension   Type II diabetes mellitus (HCC)   Status post kidney transplant   1. Right femoral neck fracture 1. Status post mechanical fall without loss of consciousness 2. EKG unremarkable. Will  check preop chest x-ray. Labs reviewed. Lungs clear on exam. Patient without chest pain or shortness of breath 3. Pending results of chest x-ray, benefits to surgery would outweigh risks. 4. Orthopedic surgery consulted and following 5. We'll keep nothing by mouth for now 2. Hypertension 1. Blood pressure suboptimally controlled, suspect component of pain secondary to acute fracture 2. We'll continue with analgesics as needed 3. Well add hydralazine as needed for systolic blood pressure greater 180 3. Diabetes type 2 1. Appears stable this time 2. We'll continue/scale insulin 3. Patient presently nothing by mouth. When patient is able to tolerate by mouth, would resume home 7030 insulin (on 20 units twice a day) 4. History kidney transplant 1. Continue with meals (as tolerated 2. Labs reviewed, creatinine appears stable 5. Immunosuppressive state 1. Per above, patient is chronically immunosuppressed secondary to history of renal transplant. Will continue regimen.  DVT prophylaxis: SCD's  Code Status: Full Family Communication: Pt in room  Disposition Plan: Uncertain at this time, anticipate possible skilled nursing facility  Consults called: Orthopedic surgery Admission status: Inpatient status as will likely require greater than 2 minutes day to surgically correct the fracture   Alexee Delsanto, Scheryl Marten MD [facility] Pager [phone]  If 7PM-7AM, please contact night-coverage [email or web address] Password  [facility]  06/02/2017, 3:49 PM

## 2017-06-02 NOTE — Consult Note (Signed)
Orthopedic Consultation   [name]  NFA:[id number]  DOB: [birth year]  DOA: 06/02/2017  PCP: Dois Davenport, MD   Requesting physician: Jeraldine Loots, MD  Reason for consultation: Right hip fracture    History of Present Illness: [name] is an 51 y.o. female presents with severe right hip pain after a ground level fall earlier today. Patient complained of immediate right hip pain and was unable to stand after the injury.  She presents with suspected right hip fracture to the [facility] ED for further evaluation and treatment.       Review of Systems:  ROS : Denies LOC and denies neck or back pain or bilateral UE pain.  She does report pain in the who leg and medial knee.   Past Medical History: Past Medical History:  Diagnosis Date  . Arthritis    "elbows, knees, legs, back" (09/24/2015)  . CKD (chronic kidney disease)   . Daily headache   . Depression    "years ago"  . End stage renal disease (HCC)    right arm AV graft, resolved post transplant  . Hypertension   . Hypothyroid   . 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 Kidney disease 2011  . Pneumonia ~ 2007?  [name] Type II diabetes mellitus (HCC)    Insulin dependant    Past Surgical History: Past Surgical History:  Procedure Laterality Date  . AMPUTATION Left 05/11/2014   Procedure: AMPUTATION LEFT GREAT TOE;  Surgeon: Kathryne Hitch, MD;  Location: [facility];  Service: Orthopedics;  Laterality: Left;  . AV FISTULA PLACEMENT Right    forearm  . BACK SURGERY    . CATARACT EXTRACTION W/ INTRAOCULAR LENS  IMPLANT, BILATERAL Bilateral   . CESAREAN SECTION  07/1999  . DG AV DIALYSIS GRAFT DECLOT OR    . INCISION AND DRAINAGE Right 06/02/2015   Procedure: INCISION AND DRAINAGE OF RIGHT 3rd RAY RESECTION;  Surgeon: Kathryne Hitch, MD;  Location: [facility] OR;  Service: Orthopedics;  Laterality: Right;  . 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 LUMBAR DISC SURGERY  2001  .  NEPHRECTOMY TRANSPLANTED ORGAN    . TOE AMPUTATION Right    1,2 & 3rd toes.  . TUBAL LIGATION  07/1999     Allergies:   Allergies  Allergen Reactions  . Ace Inhibitors Cough  . Adhesive [Tape] Itching and Other (See Comments)    Please use paper tape  . Lisinopril Cough     Social History:  reports that she quit smoking about 18 years ago. Her smoking use included Cigarettes. She has a 11.00 pack-year smoking history. She has never used smokeless tobacco. She reports that she does not drink alcohol or use drugs.   Family History: Family History  Problem Relation Age of Onset  . Emphysema Mother   . Throat cancer Mother   . COPD Mother   . Cancer Mother   . Emphysema Father   . COPD Father   . Stroke Father   . ADD / ADHD Son      Physical Exam: Vitals:   06/02/17 1315 06/02/17 1316 06/02/17 1317  BP: (!) 167/74 (!) 170/76   Pulse: 89 92   Resp: 15 16   Temp: 97.7 F (36.5 C) 97.7 F (36.5 C)   TempSrc: Oral Oral   SpO2: 97% 97%   Weight:   84.8 kg (187 lb)  Height:   5' 8.5" (1.74 m)    Heent: non-tender C spine with pain free AROM Chest: normal excursion with no pain Bilateral UEs with pain free full ROM and no pain with resisted push and pull, NVI R LE, externally rotated, pain with any ROM Right knee is not swollen and minimally tender No deformity No calf swelling, NVI, evidence of prior toe amputations  Left LE with pain free AROM and prior toe amputations  Both feet are warm and pefused   Data reviewed:  I have personally reviewed following labs and imaging studies Labs:  CBC:  Recent Labs Lab 05/28/17 0229 06/02/17 1324  WBC 4.1 7.4  NEUTROABS 2.6 5.7  HGB 11.4* 10.7*  HCT 35.0* 33.8*  MCV 88.4 89.4  PLT 198 181    Basic Metabolic Panel:  Recent Labs Lab 05/28/17 0229 06/02/17 1324  NA 137 139  K 3.8 3.9  CL 106 108  CO2 23 22  GLUCOSE 136* 269*  BUN 22* 26*  CREATININE 1.54* 1.19*  CALCIUM 10.0 9.2     Recent  Labs Lab 06/02/17 1324  INR 0.96    CBG:  Recent Labs Lab 05/28/17 0300  GLUCAP 135*    Urinalysis    Component Value Date/Time   COLORURINE STRAW (A) 05/09/2017 0920   APPEARANCEUR CLEAR 05/09/2017 0920   LABSPEC 1.016 05/09/2017 0920   PHURINE 5.0 05/09/2017 0920   GLUCOSEU >=500 (A) 05/09/2017 0920   HGBUR SMALL (A) 05/09/2017 0920   BILIRUBINUR NEGATIVE 05/09/2017 0920   KETONESUR NEGATIVE 05/09/2017 0920   PROTEINUR 100 (A) 05/09/2017 0920   UROBILINOGEN 0.2 03/02/2015 1153   NITRITE NEGATIVE 05/09/2017 0920   LEUKOCYTESUR NEGATIVE 05/09/2017 0920     Radiological Exams on Admission: Dg Hip Unilat With Pelvis 2-3 Views Right  Result Date: 06/02/2017 CLINICAL DATA:  Fall with right hip pain EXAM: DG HIP (WITH OR WITHOUT PELVIS) 2-3V RIGHT COMPARISON:  03/26/2017 FINDINGS: Acute right femoral neck fracture at the base of the femoral neck. No dislocation of the femoral head. Pubic symphysis is intact. Old deformity of the right superior pubic ramus. Vascular calcification IMPRESSION: Acute right femoral neck fracture. Electronically Signed   By: Jasmine Pang M.D.   On: 06/02/2017 14:34    Impression/Recommendations Principal Problem:   Closed displaced fracture of right femoral neck (HCC) Active Problems:   Immunosuppression (HCC)   Essential hypertension   Type II diabetes mellitus (HCC)   Status post kidney transplant Discussed with the patient the need for operative reduction and stabilization for this hip fracture.  She is at considerable risk for AVN of the hip due to an interruption of blood flow to the head.  Recommend closed reduction and percutaneous fixation of the femoral neck fracture.  I have discussed this case with Dr Lajoyce Corners and Dr Samson Frederic who both recommended pinning over arthroplasty.  Will check full length femur films.  Verlee Rossetti M.D. [facility] 06/02/2017, 4:10 PM

## 2017-06-02 NOTE — ED Triage Notes (Signed)
Per EMS:   pt was walking and fell over a power cord and they believe she has broken her hip.  Pt reports pain 10/10 Pt was giver of fentanyl BP 174/91 HR 90  LFA IV 20g  Pt has a hx of DM, kidney transplant. Pt still has scars from her stint on the right arm but extremity is not restricted.  CBG 299

## 2017-06-02 NOTE — Discharge Instructions (Signed)
Ice to the hip. Keep incision clean and dry for one week, then ok to get it wet in the shower.  NWB Right LE, be very careful during transfers,  Daily Physical Therapy for mobility and transfer training.  Only gait train if able to maintain NWB on right, otherwise wheelchair ambulation and transfers only  Follow up with Dr Ranell Patrick in two weeks [phone]

## 2017-06-03 ENCOUNTER — Encounter (HOSPITAL_COMMUNITY): Payer: Self-pay | Admitting: Orthopedic Surgery

## 2017-06-03 DIAGNOSIS — Y92009 Unspecified place in unspecified non-institutional (private) residence as the place of occurrence of the external cause: Secondary | ICD-10-CM

## 2017-06-03 DIAGNOSIS — W19XXXA Unspecified fall, initial encounter: Secondary | ICD-10-CM

## 2017-06-03 LAB — CBC
HCT: 37 % (ref 36.0–46.0)
Hemoglobin: 11.8 g/dL — ABNORMAL LOW (ref 12.0–15.0)
MCH: 28.5 pg (ref 26.0–34.0)
MCHC: 31.9 g/dL (ref 30.0–36.0)
MCV: 89.4 fL (ref 78.0–100.0)
PLATELETS: 167 10*3/uL (ref 150–400)
RBC: 4.14 MIL/uL (ref 3.87–5.11)
RDW: 13.8 % (ref 11.5–15.5)
WBC: 6.7 10*3/uL (ref 4.0–10.5)

## 2017-06-03 LAB — COMPREHENSIVE METABOLIC PANEL
ALT: 16 U/L (ref 14–54)
ANION GAP: 9 (ref 5–15)
AST: 19 U/L (ref 15–41)
Albumin: 2.7 g/dL — ABNORMAL LOW (ref 3.5–5.0)
Alkaline Phosphatase: 121 U/L (ref 38–126)
BUN: 21 mg/dL — ABNORMAL HIGH (ref 6–20)
CHLORIDE: 107 mmol/L (ref 101–111)
CO2: 23 mmol/L (ref 22–32)
Calcium: 9.3 mg/dL (ref 8.9–10.3)
Creatinine, Ser: 1.13 mg/dL — ABNORMAL HIGH (ref 0.44–1.00)
GFR calc non Af Amer: 56 mL/min — ABNORMAL LOW (ref >60)
GLUCOSE: 190 mg/dL — AB (ref 65–99)
Potassium: 4.1 mmol/L (ref 3.5–5.1)
SODIUM: 139 mmol/L (ref 135–145)
Total Bilirubin: 0.4 mg/dL (ref 0.3–1.2)
Total Protein: 6.8 g/dL (ref 6.5–8.1)

## 2017-06-03 LAB — GLUCOSE, CAPILLARY
GLUCOSE-CAPILLARY: 211 mg/dL — AB (ref 65–99)
GLUCOSE-CAPILLARY: 138 mg/dL — AB (ref 65–99)
GLUCOSE-CAPILLARY: 127 mg/dL — AB (ref 65–99)
Glucose-Capillary: 227 mg/dL — ABNORMAL HIGH (ref 65–99)
Glucose-Capillary: 148 mg/dL — ABNORMAL HIGH (ref 65–99)
Glucose-Capillary: 167 mg/dL — ABNORMAL HIGH (ref 65–99)

## 2017-06-03 MED ORDER — MUPIROCIN 2 % EX OINT
1.0000 "application " | TOPICAL_OINTMENT | Freq: Two times a day (BID) | CUTANEOUS | Status: DC
  Administered 2017-06-03 – 2017-06-05 (×6): 1 via NASAL
  Filled 2017-06-03: qty 22

## 2017-06-03 MED ORDER — CHLORHEXIDINE GLUCONATE CLOTH 2 % EX PADS
6.0000 | MEDICATED_PAD | Freq: Every day | CUTANEOUS | Status: DC
  Administered 2017-06-03 – 2017-06-05 (×3): 6 via TOPICAL

## 2017-06-03 NOTE — Progress Notes (Signed)
OT Cancellation Note  Patient Details Name: BEUNA BOLDING MRN: [id number] DOB: [birth year]   Cancelled Treatment:    Reason Eval/Treat Not Completed: Other (comment)  Noted plan for SNF- will defer OT eval to SNF  [facility], [place] [phone]  Alba Cory 06/03/2017, 12:12 PM

## 2017-06-03 NOTE — Evaluation (Signed)
Physical Therapy Evaluation Patient Details Name: [name] MRN: [id number] DOB: 05-07-1966 Today's Date: 06/03/2017   History of Present Illness  Pt admitted with R femoral neck fx and is not s/p pinning.  Pt with hc of DM and kidney tranplant  Clinical Impression  Pt admitted as above and presenting with functional mobility limitations 2* decreased R LE strength/ROM, post op pain and NWB on R LE.  Pt would greatly benefit from follow up rehab at SNF level to maximize IND and safety prior to return home with ltd assist.    Follow Up Recommendations SNF    Equipment Recommendations  None recommended by PT    Recommendations for Other Services OT consult     Precautions / Restrictions Precautions Precautions: Fall Restrictions Weight Bearing Restrictions: Yes RLE Weight Bearing: Non weight bearing      Mobility  Bed Mobility Overal bed mobility: Needs Assistance Bed Mobility: Supine to Sit;Sit to Supine     Supine to sit: Mod assist;+2 for physical assistance;+2 for safety/equipment Sit to supine: Mod assist;+2 for physical assistance;+2 for safety/equipment   General bed mobility comments: cues for sequence and use of L LE to self assist. Physical assist to manage R LE and to control trunk  Transfers Overall transfer level: Needs assistance   Transfers: Sit to/from Stand Sit to Stand: Mod assist;+2 physical assistance;+2 safety/equipment;From elevated surface         General transfer comment: cues for LE management and use of UEs to self assist  Ambulation/Gait             General Gait Details: NT - Pt able to advance R LE but unable to maintain NW  on R and advance L LE.  Pt stood at EOB x ~5 min  Stairs            Wheelchair Mobility    Modified Rankin (Stroke Patients Only)       Balance Overall balance assessment: Needs assistance Sitting-balance support: No upper extremity supported;Feet supported Sitting balance-Leahy Scale:  Fair     Standing balance support: Bilateral upper extremity supported Standing balance-Leahy Scale: Poor                               Pertinent Vitals/Pain Pain Assessment: 0-10 Pain Score: 8  Pain Location: R hip Pain Descriptors / Indicators: Aching;Sore Pain Intervention(s): Limited activity within patient's tolerance;Monitored during session;Premedicated before session;Ice applied    Home Living Family/patient expects to be discharged to:: Private residence Living Arrangements: Spouse/significant other Available Help at Discharge: Family;Available PRN/intermittently Type of Home: House Home Access: Stairs to enter Entrance Stairs-Rails: Right;Left;Can reach both Entrance Stairs-Number of Steps: 2 or 4 Home Layout: One level Home Equipment: Shower seat;Cane - single point;Walker - 4 wheels;Bedside commode      Prior Function Level of Independence: Needs assistance   Gait / Transfers Assistance Needed: pt indicates she doesn't use an AD, but holds on to furniture and the walls.    ADL's / Homemaking Assistance Needed: Husband performs house chores and heavy cooking.  Husband A with washing pt's back and feet.  pt's husband fills pill box for pt.        Hand Dominance        Extremity/Trunk Assessment   Upper Extremity Assessment Upper Extremity Assessment: Generalized weakness    Lower Extremity Assessment Lower Extremity Assessment: Generalized weakness;RLE deficits/detail RLE Deficits / Details: Strength at hip 2-/5 with  AAROM at hip to 75 flex and 15 abd    Cervical / Trunk Assessment Cervical / Trunk Assessment: Kyphotic  Communication   Communication: No difficulties  Cognition Arousal/Alertness: Awake/alert Behavior During Therapy: WFL for tasks assessed/performed Overall Cognitive Status: Within Functional Limits for tasks assessed                                        General Comments      Exercises General  Exercises - Lower Extremity Ankle Circles/Pumps: AROM;Both;15 reps;Supine Quad Sets: AROM;Both;10 reps;Supine Heel Slides: AAROM;Right;Supine;20 reps Hip ABduction/ADduction: AAROM;Right;15 reps;Supine   Assessment/Plan    PT Assessment Patient needs continued PT services  PT Problem List Decreased strength;Decreased range of motion;Decreased activity tolerance;Decreased mobility;Decreased balance;Decreased knowledge of use of DME;Pain       PT Treatment Interventions DME instruction;Gait training;Functional mobility training;Therapeutic activities;Therapeutic exercise;Patient/family education    PT Goals (Current goals can be found in the Care Plan section)  Acute Rehab PT Goals Patient Stated Goal: Less pain PT Goal Formulation: With patient Time For Goal Achievement: 06/17/17 Potential to Achieve Goals: Fair    Frequency Min 3X/week   Barriers to discharge Decreased caregiver support Spouse works    Co-evaluation               AM-PAC PT "6 Clicks" Daily Activity  Outcome Measure Difficulty turning over in bed (including adjusting bedclothes, sheets and blankets)?: Unable Difficulty moving from lying on back to sitting on the side of the bed? : Unable Difficulty sitting down on and standing up from a chair with arms (e.g., wheelchair, bedside commode, etc,.)?: Unable Help needed moving to and from a bed to chair (including a wheelchair)?: A Lot Help needed walking in hospital room?: Total Help needed climbing 3-5 steps with a railing? : Total 6 Click Score: 7    End of Session Equipment Utilized During Treatment: Gait belt Activity Tolerance: Patient limited by fatigue;Patient limited by pain Patient left: in bed;with call bell/phone within reach Nurse Communication: Mobility status PT Visit Diagnosis: Difficulty in walking, not elsewhere classified (R26.2)    Time: 1610-9604 PT Time Calculation (min) (ACUTE ONLY): 40 min   Charges:   PT Evaluation $PT  Eval Low Complexity: 1 Low PT Treatments $Gait Training: 8-22 mins $Therapeutic Exercise: 8-22 mins   PT G Codes:        Pg [phone]   Elenie Coven 06/03/2017, 4:07 PM

## 2017-06-03 NOTE — Clinical Social Work Placement (Signed)
   CLINICAL SOCIAL WORK PLACEMENT  NOTE  Date:  06/03/2017  Patient Details  Name: [name] MRN: [id number] Date of Birth: 1966-01-09  Clinical Social Work is seeking post-discharge placement for this patient at the Skilled  Nursing Facility level of care (*CSW will initial, date and re-position this form in  chart as items are completed):  Yes   Patient/family provided with [facility] Clinical Social Work Department's list of facilities offering this level of care within the geographic area requested by the patient (or if unable, by the patient's family).  Yes   Patient/family informed of their freedom to choose among providers that offer the needed level of care, that participate in Medicare, Medicaid or managed care program needed by the patient, have an available bed and are willing to accept the patient.  Yes   Patient/family informed of [facility]'s ownership interest in [facility] and [facility], as well as of the fact that they are under no obligation to receive care at these facilities.  PASRR submitted to EDS on       PASRR number received on       Existing PASRR number confirmed on       FL2 transmitted to all facilities in geographic area requested by pt/family on 06/03/17     FL2 transmitted to all facilities within larger geographic area on       Patient informed that his/her managed care company has contracts with or will negotiate with certain facilities, including the following:            Patient/family informed of bed offers received.  Patient chooses bed at       Physician recommends and patient chooses bed at      Patient to be transferred to   on  .  Patient to be transferred to facility by       Patient family notified on   of transfer.  Name of family member notified:        PHYSICIAN Please sign FL2     Additional Comment:    _______________________________________________ Raye Sorrow, LCSW 06/03/2017, 1:57 PM

## 2017-06-03 NOTE — Op Note (Signed)
[name], [name]              ACCOUNT NO.:  [id number]  MEDICAL RECORD NO.:  [id number]  LOCATION:  [facility]                         FACILITY:  [facility]  PHYSICIAN:  Almedia Balls. Ranell Patrick, M.D. DATE OF BIRTH:  1966/07/22  DATE OF PROCEDURE:  06/02/2017 DATE OF DISCHARGE:                              OPERATIVE REPORT   PREOPERATIVE DIAGNOSIS:  Minimally displaced right femoral neck fracture.  POSTOPERATIVE DIAGNOSIS:  Minimally displaced right femoral neck fracture.  PROCEDURE PERFORMED:  Closed reduction and percutaneous cannulated screw fixation of right displaced femoral neck fracture.  ATTENDING SURGEON:  Almedia Balls. Ranell Patrick, MD.  ASSISTANT:  Konrad Felix Dixon, [facility], who scrubbed during the entire procedure and necessary for satisfactory completion of surgery.  ANESTHESIA:  General anesthesia was used.  ESTIMATED BLOOD LOSS:  Less than 50 mL.  FLUID REPLACED:  1000 mL of crystalloid.  INSTRUMENT COUNTS:  Correct.  COMPLICATIONS:  There were no complications.  Perioperative antibiotics were given.  INDICATIONS:  The patient is a 51 year old female, who presents with a history of a right hip injury resulting from a mechanical fall.  The patient complained of immediate pain in her right hip and inability to stand or weight bear on the right lower extremity after the injury.  She presented with a minimally displaced valgus femoral neck fracture. There was about 2-mm displacement at the medial calcar and this fracture was fairly distal in the femoral neck.  I discussed this with Dr. Samson Frederic and Dr. Durene Romans.  Both, we felt that this patient would be best served with an attempted closed reduction and percutaneous cannulated screw fixation rather than arthroplasty.  Discussed this in detail with the patient and her family, recommending closed reduction and cannulated screw fixation.  They agreed and informed consent was obtained.  DESCRIPTION OF PROCEDURE:  After an  adequate level of anesthesia was achieved, the patient was positioned in the supine position on the fracture table.  Perineal post was utilized.  The left leg was placed in a modified lithotomy position and padded appropriately.  Neurovascular structures were padded and the pulses were checked when she was in that position.  The right leg was placed in traction boot and very carefully cement line traction was applied.  The C-arm was brought in and after our time-out verifying correct patient and correct site, we did some initial C-arm views verifying that on the AP and lateral view with very minimal adduction that we were able to get an anatomic reduction of her hip and we were pleased with that reduction and quality of the reduction which looked perfect.  At this point, we sterilely prepped and draped the right hip and down the lateral thigh.  Shower drape was utilized sterilely.  At this point, we identified our starting point on the lateral side of the hip distal to the greater trochanter with the C-arm and also with our guide pins.  We made our starting incision on the lateral hip and then introduced our guide pin to the appropriate position on the lateral femur.  We then drilled from lateral distal to proximal medial and went perpendicular to the fracture site.  The first pin that we were going  to place was going to be an 8.0 cannulated screw. We wanted to put that at the very inferior most aspect of the fracture site, which we were able to accomplish and it was centered on the lateral.  We then placed 2 more superior screws, 1 anterior and 1 posterior to create a triangular-type pattern.  We had excellent purchase with our screws.  We had two 6.5 screws of the appropriate length proximally and then the 8.0 screw distally, and we were pleased with the screw purchase.  Had a 40-mm thread length on the inferior screw and could only get the 16-mm threads proximally.  We could not do the  40s to keep all the threads across the fracture site.  At this point, we were pleased with the quality of the reduction and the placement of the screws.  We obtained final imaging, irrigated thoroughly and closed with 2-0 Vicryl subcu closure and staples for the skin.  Sterile bandage was applied.  The patient was transported safely to the recovery room in stable condition.     Almedia Balls. Ranell Patrick, M.D.     SRN/MEDQ  D:  06/02/2017  T:  06/03/2017  Job:  [id number]

## 2017-06-03 NOTE — NC FL2 (Signed)
[place] MEDICAID FL2 LEVEL OF CARE SCREENING TOOL     IDENTIFICATION  Patient Name: [name] Birthdate: 1965-12-20 Sex: female Admission Date (Current Location): 06/02/2017  [facility] and [place] Number:  Producer, television/film/video and Address:  [facility],  [address] [place], [place] [address]      Provider Number: [id number]  Attending Physician Name and Address:  Jerald Kief, MD  Relative Name and Phone Number:       Current Level of Care: Hospital Recommended Level of Care: Skilled Nursing Facility Prior Approval Number:    Date Approved/Denied:   PASRR Number:   [id number]  Discharge Plan: SNF    Current Diagnoses: Patient Active Problem List   Diagnosis Date Noted  . Status post kidney transplant 06/02/2017  . Closed displaced fracture of right femoral neck (HCC) 06/02/2017  . Femoral neck fracture (HCC) 06/02/2017  . Hyperglycemia 05/09/2017  . Lower extremity cellulitis 03/23/2017  . Type II diabetes mellitus (HCC) 03/23/2017  . Anemia 01/26/2017  . Abnormal LFTs 01/26/2017  . DKA (diabetic ketoacidoses) (HCC) 08/12/2016  . Hypoglycemia   . Wound cellulitis   . CKD (chronic kidney disease) stage 3, GFR 30-59 ml/min 06/26/2016  . Cellulitis of right lower extremity   . QT prolongation 05/13/2016  . UTI (lower urinary tract infection)   . Hypomagnesemia   . Pressure ulcer 04/11/2016  . Acute encephalopathy 04/11/2016  . Altered mental status 04/10/2016  . Insulin dependent diabetes mellitus (HCC) 04/10/2016  . Diabetic ketoacidosis without coma associated with type 2 diabetes mellitus (HCC) 11/02/2015  . Uncontrolled type 1 diabetes mellitus with foot ulcer (HCC) 06/04/2015  . Foot ulcer, right (HCC)   . Diarrhea 06/02/2015  . Type 1 diabetes mellitus with diabetic foot ulcer (HCC) 06/02/2015  . Acute renal failure superimposed on stage 3 chronic kidney disease (HCC) 06/02/2015  . Diabetic foot infection (HCC) 06/01/2015  .  Essential hypertension 06/01/2015  . GERD (gastroesophageal reflux disease) 06/01/2015  . Hypotension 03/22/2014  . H/O kidney transplant 08/04/2012  . Sepsis (HCC) 08/04/2012  . Hyperkalemia 09/22/2011  . Hyponatremia 09/22/2011  . Immunosuppression (HCC)   . Hypothyroidism     Orientation RESPIRATION BLADDER Height & Weight     Self, Time, Situation, Place  Normal Continent Weight: 187 lb (84.8 kg) Height:  5' 8.5" (174 cm)  BEHAVIORAL SYMPTOMS/MOOD NEUROLOGICAL BOWEL NUTRITION STATUS      Continent Diet (See DC orders)  AMBULATORY STATUS COMMUNICATION OF NEEDS Skin   Extensive Assist Verbally Surgical wounds, Skin abrasions, Bruising                       Personal Care Assistance Level of Assistance  Bathing, Feeding, Dressing Bathing Assistance: Limited assistance Feeding assistance: Independent Dressing Assistance: Limited assistance     Functional Limitations Info  Sight, Hearing, Speech Sight Info: Adequate Hearing Info: Adequate Speech Info: Adequate    SPECIAL CARE FACTORS FREQUENCY  PT (By licensed PT), OT (By licensed OT)     PT Frequency: 5x OT Frequency: 5x            Contractures Contractures Info: Not present    Additional Factors Info  Code Status, Isolation Precautions Code Status Info: Full Code    Allergies:  Ace Inhibitors, Adhesive [Tape], Lisinopril   Isolation Precautions Info: on contact: MRSA (PCR)     Current Medications (06/03/2017):  This is the current hospital active medication list Current Facility-Administered Medications  Medication Dose Route  Frequency Provider Last Rate Last Dose  . 0.9 %  sodium chloride infusion   Intravenous Continuous Jerald Kief, MD 75 mL/hr at 06/03/17 0151    . acetaminophen (TYLENOL) tablet 650 mg  650 mg Oral Q6H PRN Jerald Kief, MD       Or  . acetaminophen (TYLENOL) suppository 650 mg  650 mg Rectal Q6H PRN Jerald Kief, MD      . acetaminophen (TYLENOL) tablet 650 mg  650 mg  Oral Q6H PRN Beverely Low, MD       Or  . acetaminophen (TYLENOL) suppository 650 mg  650 mg Rectal Q6H PRN Beverely Low, MD      . acetaminophen (TYLENOL) tablet 650 mg  650 mg Oral Q6H PRN Beverely Low, MD      . amLODipine (NORVASC) tablet 10 mg  10 mg Oral Daily Jerald Kief, MD   10 mg at 06/03/17 1008  . aspirin EC tablet 81 mg  81 mg Oral Daily PRN Beverely Low, MD      . carvedilol (COREG) tablet 50 mg  50 mg Oral BID WC Jerald Kief, MD   50 mg at 06/03/17 0743  . Chlorhexidine Gluconate Cloth 2 % PADS 6 each  6 each Topical Daily Jerald Kief, MD   6 each at 06/03/17 1234  . enoxaparin (LOVENOX) injection 40 mg  40 mg Subcutaneous Q24H Beverely Low, MD   40 mg at 06/03/17 1007  . ferrous sulfate tablet 325 mg  325 mg Oral TID Arlyn Dunning, MD   325 mg at 06/03/17 1227  . hydrALAZINE (APRESOLINE) injection 10 mg  10 mg Intravenous Q4H PRN Jerald Kief, MD      . HYDROcodone-acetaminophen (NORCO/VICODIN) 5-325 MG per tablet 0.5-1 tablet  0.5-1 tablet Oral Q4H PRN Beverely Low, MD   1 tablet at 06/03/17 1008  . insulin aspart (novoLOG) injection 0-15 Units  0-15 Units Subcutaneous Q4H Jerald Kief, MD   3 Units at 06/03/17 1227  . insulin aspart protamine- aspart (NOVOLOG MIX 70/30) injection 20 Units  20 Units Subcutaneous BID WC Beverely Low, MD   20 Units at 06/03/17 0754  . levothyroxine (SYNTHROID, LEVOTHROID) tablet 137 mcg  137 mcg Oral QAC breakfast Jerald Kief, MD   137 mcg at 06/03/17 [phone]  . menthol-cetylpyridinium (CEPACOL) lozenge 3 mg  1 lozenge Oral PRN Beverely Low, MD       Or  . phenol (CHLORASEPTIC) mouth spray 1 spray  1 spray Mouth/Throat PRN Beverely Low, MD      . morphine 4 MG/ML injection 0.52 mg  0.52 mg Intravenous Q2H PRN Beverely Low, MD   0.52 mg at 06/03/17 1234  . mupirocin ointment (BACTROBAN) 2 % 1 application  1 application Nasal BID Jerald Kief, MD   1 application at 06/03/17 1008  . mycophenolate (CELLCEPT) capsule  500 mg  500 mg Oral BID Jerald Kief, MD   500 mg at 06/03/17 1235  . ondansetron (ZOFRAN) tablet 4 mg  4 mg Oral Q6H PRN Beverely Low, MD       Or  . ondansetron [facility]) injection 4 mg  4 mg Intravenous Q6H PRN Beverely Low, MD   4 mg at 06/03/17 1300  . predniSONE (DELTASONE) tablet 5 mg  5 mg Oral QHS Jerald Kief, MD      . tacrolimus (PROGRAF) capsule 0.5 mg  0.5 mg Oral Burna Cash, MD   0.5  mg at 06/03/17 0753  . tacrolimus (PROGRAF) capsule 1 mg  1 mg Oral QPM Jerald Kief, MD         Discharge Medications: Please see discharge summary for a list of discharge medications.  Relevant Imaging Results:  Relevant Lab Results:   Additional Information SSN: [id number], [place], [place]

## 2017-06-03 NOTE — Progress Notes (Signed)
Orthopedics Progress Note  Subjective: Feeling some better this morning  Objective:  Vitals:   06/03/17 0215 06/03/17 0621  BP: (!) 164/79 (!) 180/84  Pulse: 81 86  Resp: 14 16  Temp: 97.8 F (36.6 C) 98.2 F (36.8 C)  SpO2: 100% 100%    General: Awake and alert  Musculoskeletal: right LE dressing intact, leg lengths equal, NVI Neurovascularly intact  Lab Results  Component Value Date   WBC 6.7 06/03/2017   HGB 11.8 (L) 06/03/2017   HCT 37.0 06/03/2017   MCV 89.4 06/03/2017   PLT 167 06/03/2017       Component Value Date/Time   NA 139 06/02/2017 1324   K 3.9 06/02/2017 1324   CL 108 06/02/2017 1324   CO2 22 06/02/2017 1324   GLUCOSE 269 (H) 06/02/2017 1324   BUN 26 (H) 06/02/2017 1324   CREATININE 1.19 (H) 06/02/2017 1324   CREATININE 1.07 10/29/2014 1005   CALCIUM 9.2 06/02/2017 1324   GFRNONAA 52 (L) 06/02/2017 1324   GFRAA >60 06/02/2017 1324    Lab Results  Component Value Date   INR 0.96 06/02/2017   INR 1.16 04/11/2016   INR 0.94 04/10/2016    Assessment/Plan: POD #1 s/p Procedure(s): CANNULATED RIGHT HIP PINNING NWB on the right, up with PT, OT D/C planning, will need SNF ([facility]) DVT prophylaxis  Almedia Balls. Ranell Patrick, MD 06/03/2017 7:33 AM

## 2017-06-03 NOTE — Progress Notes (Signed)
PROGRESS NOTE    [name]  WUJ:[id number] DOB: [birth year] DOA: 06/02/2017 PCP: Dois Davenport, MD     Brief Narrative:  51 y.o. female with medical history significant of ESRD s/p renal transplant, diabetes, hypertension, depression who presented to the hospital following a mechanical fall after tripping over her cord. This resulted in severe right hip pain with difficulty ambulating. Patient is also present to the [facility] department for further workup.  ED Course: In the emergency department, patient noted to have an acute right femoral neck fracture on x-ray. Orthopedic surgery consulted with recommendations for inpatient stay, likely surgery. Hospital service consulted for consideration for admission.  Assessment & Plan:   Principal Problem:   Closed displaced fracture of right femoral neck (HCC) Active Problems:   Immunosuppression (HCC)   Essential hypertension   Type II diabetes mellitus (HCC)   Status post kidney transplant   Femoral neck fracture (HCC)   1. Right femoral neck fracture 1. Status post mechanical fall without loss of consciousness 2. Patient underwent surgery 9/26 3. PT/OT consulted 4. Anticipate SNF placement 2. Hypertension 1. Blood pressure suboptimally controlled, suspect component of pain secondary to acute fracture 2. We'll continue with analgesics as needed 3. Continue PRN hydralazine 3. Diabetes type 2 1. Appears stable this time 2. We'll continue/scale insulin 3. Continue home 70/30 insulin 4. History kidney transplant 1. Continue with meals as tolerated 2. Repeat bmet in AM 5. Immunosuppressive state 1. Per above, patient is chronically immunosuppressed secondary to history of renal transplant.  2. Cont as tolerated  DVT prophylaxis: lovenox subq Code Status: Full Family Communication: Pt in room, family not at bedside Disposition Plan: anticipate SNF  Consultants:   Orthopedic Surgery  Procedures:   Cannulated right hip  pinning 9/26  Antimicrobials: Anti-infectives    Start     Dose/Rate Route Frequency Ordered Stop   06/03/17 0600  ceFAZolin (ANCEF) IVPB 2g/100 mL premix  Status:  Discontinued     2 g 200 mL/hr over 30 Minutes Intravenous On call to O.R. 06/02/17 1815 06/02/17 1841   06/03/17 0200  ceFAZolin (ANCEF) IVPB 2g/100 mL premix     2 g 200 mL/hr over 30 Minutes Intravenous Every 6 hours 06/02/17 2310 06/03/17 0813   06/02/17 1845  ceFAZolin (ANCEF) IVPB 2g/100 mL premix     2 g 200 mL/hr over 30 Minutes Intravenous On call to O.R. 06/02/17 1841 06/02/17 1950   06/02/17 1841  ceFAZolin (ANCEF) 2-4 GM/100ML-% IVPB    Comments:  Marney Doctor   : cabinet override      06/02/17 1841 06/02/17 1950       Subjective: Complaining of continued postop pain  Objective: Vitals:   06/03/17 0621 06/03/17 0940 06/03/17 1257 06/03/17 1739  BP: (!) 180/84 111/64 (!) 186/82 (!) 159/77  Pulse: 86 80 88 89  Resp: Temp: 98.2 F (36.8 C) 97.8 F (36.6 C) 97.9 F (36.6 C)   TempSrc: Oral Oral Oral   SpO2: 100% 100% 98% 99%  Weight:      Height:        Intake/Output Summary (Last 24 hours) at 06/03/17 1757 Last data filed at 06/03/17 1739  Gross per 24 hour  Intake          2938.75 ml  Output             2950 ml  Net           -11.25 ml   Filed  Weights   06/02/17 1317 06/02/17 1745  Weight: 84.8 kg (187 lb) 84.8 kg (187 lb)    Examination:  General exam: Appears calm and comfortable  Respiratory system: Clear to auscultation. Respiratory effort normal. Cardiovascular system: S1 & S2 heard, RRR. Gastrointestinal system: Abdomen is nondistended, soft and nontender. No organomegaly or masses felt. Normal bowel sounds heard. Central nervous system: Alert and oriented. No focal neurological deficits. Extremities: Symmetric 5 x 5 power. Skin: No rashes, lesions  Psychiatry: Judgement and insight appear normal. Mood & affect appropriate.   Data Reviewed: I have personally  reviewed following labs and imaging studies  CBC:  Recent Labs Lab 05/28/17 0229 06/02/17 1324 06/03/17 0643  WBC 4.1 7.4 6.7  NEUTROABS 2.6 5.7  --   HGB 11.4* 10.7* 11.8*  HCT 35.0* 33.8* 37.0  MCV 88.4 89.4 89.4  PLT 198 181 167   Basic Metabolic Panel:  Recent Labs Lab 05/28/17 0229 06/02/17 1324 06/03/17 0643  NA 137 139 139  K 3.8 3.9 4.1  CL 106 108 107  CO2 GLUCOSE 136* 269* 190*  BUN 22* 26* 21*  CREATININE 1.54* 1.19* 1.13*  CALCIUM 10.0 9.2 9.3   GFR: Estimated Creatinine Clearance: 68.6 mL/min (A) (by C-G formula based on SCr of 1.13 mg/dL (H)). Liver Function Tests:  Recent Labs Lab 06/03/17 0643  AST 19  ALT 16  ALKPHOS 121  BILITOT 0.4  PROT 6.8  ALBUMIN 2.7*   No results for input(s): LIPASE, AMYLASE in the last 168 hours. No results for input(s): AMMONIA in the last 168 hours. Coagulation Profile:  Recent Labs Lab 06/02/17 1324  INR 0.96   Cardiac Enzymes: No results for input(s): CKTOTAL, CKMB, CKMBINDEX, TROPONINI in the last 168 hours. BNP (last 3 results) No results for input(s): PROBNP in the last 8760 hours. HbA1C: No results for input(s): HGBA1C in the last 72 hours. CBG:  Recent Labs Lab 06/03/17 0120 06/03/17 0429 06/03/17 0726 06/03/17 1137 06/03/17 1615  GLUCAP 227* 211* 148* 167* 138*   Lipid Profile: No results for input(s): CHOL, HDL, LDLCALC, TRIG, CHOLHDL, LDLDIRECT in the last 72 hours. Thyroid Function Tests: No results for input(s): TSH, T4TOTAL, FREET4, T3FREE, THYROIDAB in the last 72 hours. Anemia Panel: No results for input(s): VITAMINB12, FOLATE, FERRITIN, TIBC, IRON, RETICCTPCT in the last 72 hours. Sepsis Labs:  Recent Labs Lab 05/28/17 0236 05/28/17 0625  LATICACIDVEN 0.80 0.83    Recent Results (from the past 240 hour(s))  Surgical pcr screen     Status: Abnormal   Collection Time: 06/02/17  6:23 PM  Result Value Ref Range Status   MRSA, PCR NEGATIVE NEGATIVE Final    Staphylococcus aureus POSITIVE (A) NEGATIVE Final    Comment: (NOTE) The Xpert SA Assay (FDA approved for NASAL specimens in patients 13 years of age and older), is one component of a comprehensive surveillance program. It is not intended to diagnose infection nor to guide or monitor treatment.      Radiology Studies: Dg Chest Port 1 View  Result Date: 06/02/2017 CLINICAL DATA:  Preop, history of hypertension and diabetes EXAM: PORTABLE CHEST 1 VIEW COMPARISON:  05/09/2017 FINDINGS: The heart size and mediastinal contours are within normal limits. Both lungs are clear. The visualized skeletal structures are unremarkable. IMPRESSION: No active disease. Electronically Signed   By: Jasmine Pang M.D.   On: 06/02/2017 17:40   Dg C-arm 1-60 Min-no Report  Result Date: 06/02/2017 Fluoroscopy was utilized by the requesting physician.  No radiographic interpretation.   Dg Hip Operative Unilat W Or W/o Pelvis Right  Result Date: 06/02/2017 CLINICAL DATA:  Right hip pinning; 1 min 42 sec fluoro time, 14.32mG y EXAM: OPERATIVE RIGHT HIP (WITH PELVIS IF PERFORMED) 2 VIEWS TECHNIQUE: Fluoroscopic spot image(s) were submitted for interpretation post-operatively. COMPARISON:  Preoperative images dated 06/02/2017 at 1422 hours FINDINGS: Two fluoroscopic images show placement of 3 screws across the right femoral neck fracture reducing the fracture into near anatomic alignment. The orthopedic hardware is well-seated. No new fracture or evidence of an operative complication. IMPRESSION: Well-aligned right femoral neck fracture following ORIF. Electronically Signed   By: Amie Portland M.D.   On: 06/02/2017 21:27   Dg Hip Unilat With Pelvis 2-3 Views Right  Result Date: 06/02/2017 CLINICAL DATA:  Fall with right hip pain EXAM: DG HIP (WITH OR WITHOUT PELVIS) 2-3V RIGHT COMPARISON:  03/26/2017 FINDINGS: Acute right femoral neck fracture at the base of the femoral neck. No dislocation of the femoral head. Pubic  symphysis is intact. Old deformity of the right superior pubic ramus. Vascular calcification IMPRESSION: Acute right femoral neck fracture. Electronically Signed   By: Jasmine Pang M.D.   On: 06/02/2017 14:34   Dg Femur Port, Min 2 Views Right  Result Date: 06/02/2017 CLINICAL DATA:  Right hip and knee pain after fall today. EXAM: RIGHT FEMUR PORTABLE 2 VIEW COMPARISON:  None. FINDINGS: A minimally displaced basicervical fracture of the right femur is noted with approximately 2 mm of lateral displacement of the distal fracture fragment. An additional linear lucency is seen just below the level of the trochanters on the AP view that may also represent a nondisplaced fracture involving the subtrochanteric proximal femur. Dedicated right hip radiographs or CT may help for better assessment. A faint linear lucency is also seen involving the superior pubic ramus possibly due to overlying bowel. Fracture is not excluded. No distal femoral fracture or malalignment of the knee joint is seen. IMPRESSION: 1. An acute basicervical fracture of the right femoral neck with minimal lateral displacement by 1-2 mm. Additional suspicious lucencies are seen in the sub trochanteric portion of the femur and right superior pubic ramus that may also represent nondisplaced fractures or Mach lines from adjacent bowel or skin fold artifacts. Dedicated right hip radiographs may help or CT for better assessment. 2. No apparent fracture of the mid to distal femur nor fracture about the right knee. Electronically Signed   By: Tollie Eth M.D.   On: 06/02/2017 17:45    Scheduled Meds: . amLODipine  10 mg Oral Daily  . carvedilol  50 mg Oral BID WC  . Chlorhexidine Gluconate Cloth  6 each Topical Daily  . enoxaparin (LOVENOX) injection  40 mg Subcutaneous Q24H  . ferrous sulfate  325 mg Oral TID PC  . insulin aspart  0-15 Units Subcutaneous Q4H  . insulin aspart protamine- aspart  20 Units Subcutaneous BID WC  . levothyroxine  137  mcg Oral QAC breakfast  . mupirocin ointment  1 application Nasal BID  . mycophenolate  500 mg Oral BID  . predniSONE  5 mg Oral QHS  . tacrolimus  0.5 mg Oral BH-q7a  . tacrolimus  1 mg Oral QPM   Continuous Infusions: . sodium chloride 75 mL/hr at 06/03/17 1654     LOS: 1 day   CHIU, Scheryl Marten, MD [facility] Pager [phone]  If 7PM-7AM, please contact night-coverage [email or web address] Password [facility] 06/03/2017, 5:57 PM

## 2017-06-03 NOTE — Clinical Social Work Note (Signed)
Clinical Social Work Assessment  Patient Details  Name: [name] MRN: [id number] Date of Birth: [birth year]  Date of referral:  06/03/17               Reason for consult:  Facility Placement, Discharge Planning                Permission sought to share information with:  Case Manager, Magazine features editor, Family Supports Permission granted to share information::  Yes, Verbal Permission Granted  Name::        Agency::  [facility] and SNF referrals  Relationship::  Husband and Son  Solicitor Information:     Housing/Transportation Living arrangements for the past 2 months:  Skilled Building surveyor of Information:  Patient, Medical Team, Case Manager Patient Interpreter Needed:  None Criminal Activity/Legal Involvement Pertinent to Current Situation/Hospitalization:  No - Comment as needed Significant Relationships:  Adult Children, Other Family Members, Spouse Lives with:  Adult Children, Spouse Do you feel safe going back to the place where you live?  No Need for family participation in patient care:  No (Coment)  Care giving concerns:  Patient admitted for acute right femoral neck fracture on x-ray. Orthopedic surgery consulted with recommendations for inpatient stay, likely surgery.  Patient lives at home with her son and husband.  Reports she completed rehab in past (about a year ago) at [facility] and hopeful to return to [facility].   Social Worker assessment / plan:  LCSW completed assessment and consult for discharge planning/dipsosition.  Patient agreeable to recommendation to SNF. SNF work up completed. Will follow up with bed offers.  Plan:  DC to SNF when medically stable. Patient requesting EMS at DC.   Employment status:  Retired Health and safety inspector:  [employer] PT Recommendations:  Skilled Nursing Facility Information / Referral to community resources:  Skilled Nursing Facility  Patient/Family's Response to care:  Understanding and  agreeable  Patient/Family's Understanding of and Emotional Response to Diagnosis, Current Treatment, and Prognosis:  Patient enjoying her lunch at time of assessment. She is smiling, but reporting she is in pain.  Reports she is hopeful for quick rehab at [facility] and happy with care she received about a year ago.  She Is understanding of recommendations for SNF/safe DC plan.  Emotional Assessment Appearance:  Appears stated age Attitude/Demeanor/Rapport:    Affect (typically observed):  Accepting, Adaptable, Pleasant Orientation:  Oriented to Self, Oriented to Place, Oriented to  Time, Oriented to Situation Alcohol / Substance use:  Not Applicable Psych involvement (Current and /or in the community):  No (Comment)  Discharge Needs  Concerns to be addressed:  No discharge needs identified Readmission within the last 30 days:  Yes Current discharge risk:  None Barriers to Discharge:  Continued Medical Work up   Raye Sorrow, LCSW 06/03/2017, 2:13 PM

## 2017-06-04 DIAGNOSIS — Z01818 Encounter for other preprocedural examination: Secondary | ICD-10-CM

## 2017-06-04 LAB — GLUCOSE, CAPILLARY
GLUCOSE-CAPILLARY: 134 mg/dL — AB (ref 65–99)
GLUCOSE-CAPILLARY: 72 mg/dL (ref 65–99)
Glucose-Capillary: 108 mg/dL — ABNORMAL HIGH (ref 65–99)
Glucose-Capillary: 112 mg/dL — ABNORMAL HIGH (ref 65–99)
Glucose-Capillary: 124 mg/dL — ABNORMAL HIGH (ref 65–99)
Glucose-Capillary: 79 mg/dL (ref 65–99)

## 2017-06-04 NOTE — Progress Notes (Signed)
Plan for d/c to SNF, discharge planning per CSW. [phone] 

## 2017-06-04 NOTE — Progress Notes (Signed)
Physical Therapy Treatment Patient Details Name: [name] MRN: [id number] DOB: Jun 03, 1966 Today's Date: 06/04/2017    History of Present Illness Pt admitted with R femoral neck fx and is not s/p pinning.  Pt with hc of DM and kidney tranplant    PT Comments    Pt cooperative but progressing slowly 2* c/o pain and difficulty complying with NWB status on R LE   Follow Up Recommendations  SNF     Equipment Recommendations  None recommended by PT    Recommendations for Other Services OT consult     Precautions / Restrictions Precautions Precautions: Fall Restrictions Weight Bearing Restrictions: Yes RLE Weight Bearing: Non weight bearing    Mobility  Bed Mobility Overal bed mobility: Needs Assistance Bed Mobility: Sit to Supine     Supine to sit: Mod assist;+2 for physical assistance;+2 for safety/equipment Sit to supine: Mod assist;+2 for physical assistance;+2 for safety/equipment   General bed mobility comments: cues for sequence and use of L LE to self assist. Physical assist to manage R LE and to control trunk  Transfers Overall transfer level: Needs assistance   Transfers: Sit to/from Stand Sit to Stand: Mod assist;+2 physical assistance;+2 safety/equipment;From elevated surface Stand pivot transfers: Mod assist;+2 physical assistance;+2 safety/equipment       General transfer comment: cues for LE management and use of UEs to self assist  Ambulation/Gait             General Gait Details: Stand pvt only.  Pt struggling with adherenct to NWB   Stairs            Wheelchair Mobility    Modified Rankin (Stroke Patients Only)       Balance Overall balance assessment: Needs assistance Sitting-balance support: No upper extremity supported;Feet supported Sitting balance-Leahy Scale: Good     Standing balance support: Bilateral upper extremity supported Standing balance-Leahy Scale: Poor                               Cognition Arousal/Alertness: Awake/alert Behavior During Therapy: WFL for tasks assessed/performed Overall Cognitive Status: Within Functional Limits for tasks assessed                                        Exercises General Exercises - Lower Extremity Ankle Circles/Pumps: AROM;Both;15 reps;Supine Quad Sets: AROM;Both;10 reps;Supine Heel Slides: AAROM;Right;Supine;20 reps Hip ABduction/ADduction: AAROM;Right;15 reps;Supine    General Comments        Pertinent Vitals/Pain Pain Assessment: 0-10 Pain Score: 8  Pain Location: R hip Pain Descriptors / Indicators: Aching;Sore Pain Intervention(s): Limited activity within patient's tolerance;Monitored during session;Patient requesting pain meds-RN notified;Ice applied    Home Living                      Prior Function            PT Goals (current goals can now be found in the care plan section) Acute Rehab PT Goals Patient Stated Goal: Less pain PT Goal Formulation: With patient Time For Goal Achievement: 06/17/17 Potential to Achieve Goals: Fair Progress towards PT goals: Progressing toward goals    Frequency    Min 3X/week      PT Plan Current plan remains appropriate    Co-evaluation              AM-PAC  PT "6 Clicks" Daily Activity  Outcome Measure  Difficulty turning over in bed (including adjusting bedclothes, sheets and blankets)?: Unable Difficulty moving from lying on back to sitting on the side of the bed? : Unable Difficulty sitting down on and standing up from a chair with arms (e.g., wheelchair, bedside commode, etc,.)?: Unable Help needed moving to and from a bed to chair (including a wheelchair)?: A Lot Help needed walking in hospital room?: Total Help needed climbing 3-5 steps with a railing? : Total 6 Click Score: 7    End of Session Equipment Utilized During Treatment: Gait belt Activity Tolerance: Patient limited by pain;Patient tolerated treatment  well Patient left: with call bell/phone within reach;in bed;with nursing/sitter in room Nurse Communication: Mobility status PT Visit Diagnosis: Difficulty in walking, not elsewhere classified (R26.2)     Time: 1610-9604 PT Time Calculation (min) (ACUTE ONLY): 16 min  Charges:  $Therapeutic Exercise: 8-22 mins $Therapeutic Activity: 8-22 mins                    G Codes:       Pg [phone]    Romano Stigger 06/04/2017, 3:59 PM

## 2017-06-04 NOTE — Progress Notes (Signed)
PROGRESS NOTE    [name]  NWG:[id number] DOB: 03-08-1966 DOA: 06/02/2017 PCP: Dois Davenport, MD     Brief Narrative:  51 y.o. female with medical history significant of ESRD s/p renal transplant, diabetes, hypertension, depression who presented to the hospital following a mechanical fall after tripping over her cord. This resulted in severe right hip pain with difficulty ambulating. Patient is also present to the [facility] department for further workup.  ED Course: In the emergency department, patient noted to have an acute right femoral neck fracture on x-ray. Orthopedic surgery consulted with recommendations for inpatient stay, likely surgery. Hospital service consulted for consideration for admission.  Assessment & Plan:   Principal Problem:   Closed displaced fracture of right femoral neck (HCC) Active Problems:   Immunosuppression (HCC)   Essential hypertension   Type II diabetes mellitus (HCC)   Status post kidney transplant   Femoral neck fracture (HCC)   1. Right femoral neck fracture 1. Status post mechanical fall without loss of consciousness 2. Patient underwent surgery 9/26 3. PT/OT consulted 4. Planning for SNF placement 2. Hypertension 1. Blood pressure suboptimally controlled, suspect component of pain secondary to acute fracture 2. We'll continue with analgesics as needed 3. Continue PRN hydralazine 3. Diabetes type 2 1. Appears stable this time 2. We'll continue/scale insulin 3. Continue home 70/30 insulin 4. History kidney transplant 1. Continue with meals as tolerated 2. Repeat bmet in AM 5. Immunosuppressive state 1. Per above, patient is chronically immunosuppressed secondary to history of renal transplant.  2. Cont as tolerated  DVT prophylaxis: lovenox subq Code Status: Full Family Communication: Pt in room, family not at bedside Disposition Plan: anticipate SNF  Consultants:   Orthopedic Surgery  Procedures:   Cannulated right  hip pinning 9/26  Antimicrobials: Anti-infectives    Start     Dose/Rate Route Frequency Ordered Stop   06/03/17 0600  ceFAZolin (ANCEF) IVPB 2g/100 mL premix  Status:  Discontinued     2 g 200 mL/hr over 30 Minutes Intravenous On call to O.R. 06/02/17 1815 06/02/17 1841   06/03/17 0200  ceFAZolin (ANCEF) IVPB 2g/100 mL premix     2 g 200 mL/hr over 30 Minutes Intravenous Every 6 hours 06/02/17 2310 06/03/17 0813   06/02/17 1845  ceFAZolin (ANCEF) IVPB 2g/100 mL premix     2 g 200 mL/hr over 30 Minutes Intravenous On call to O.R. 06/02/17 1841 06/02/17 1950   06/02/17 1841  ceFAZolin (ANCEF) 2-4 GM/100ML-% IVPB    Comments:  Marney Doctor   : cabinet override      06/02/17 1841 06/02/17 1950      Subjective: Still reports mild postoperative pain  Objective: Vitals:   06/03/17 1739 06/03/17 2211 06/04/17 0645 06/04/17 1318  BP: (!) 159/77 (!) 143/69 (!) 173/76 (!) 187/95  Pulse: 89 76 74 76  Resp: Temp:  97.9 F (36.6 C) 97.7 F (36.5 C) 98 F (36.7 C)  TempSrc:  Oral Oral Oral  SpO2: 99% 98% 99% 100%  Weight:      Height:        Intake/Output Summary (Last 24 hours) at 06/04/17 1459 Last data filed at 06/04/17 1319  Gross per 24 hour  Intake             2140 ml  Output             2750 ml  Net             -  610 ml   Filed Weights   06/02/17 1317 06/02/17 1745  Weight: 84.8 kg (187 lb) 84.8 kg (187 lb)    Examination: General exam: Awake, laying in bed, in nad Respiratory system: Normal respiratory effort, no wheezing Cardiovascular system: regular rate, s1, s2  Data Reviewed: I have personally reviewed following labs and imaging studies  CBC:  Recent Labs Lab 06/02/17 1324 06/03/17 0643  WBC 7.4 6.7  NEUTROABS 5.7  --   HGB 10.7* 11.8*  HCT 33.8* 37.0  MCV 89.4 89.4  PLT 181 167   Basic Metabolic Panel:  Recent Labs Lab 06/02/17 1324 06/03/17 0643  NA 139 139  K 3.9 4.1  CL 108 107  CO2 22 23  GLUCOSE 269* 190*  BUN 26*  21*  CREATININE 1.19* 1.13*  CALCIUM 9.2 9.3   GFR: Estimated Creatinine Clearance: 68.6 mL/min (A) (by C-G formula based on SCr of 1.13 mg/dL (H)). Liver Function Tests:  Recent Labs Lab 06/03/17 0643  AST 19  ALT 16  ALKPHOS 121  BILITOT 0.4  PROT 6.8  ALBUMIN 2.7*   No results for input(s): LIPASE, AMYLASE in the last 168 hours. No results for input(s): AMMONIA in the last 168 hours. Coagulation Profile:  Recent Labs Lab 06/02/17 1324  INR 0.96   Cardiac Enzymes: No results for input(s): CKTOTAL, CKMB, CKMBINDEX, TROPONINI in the last 168 hours. BNP (last 3 results) No results for input(s): PROBNP in the last 8760 hours. HbA1C: No results for input(s): HGBA1C in the last 72 hours. CBG:  Recent Labs Lab 06/03/17 2059 06/04/17 0015 06/04/17 0433 06/04/17 0739 06/04/17 1148  GLUCAP 127* 79 112* 134* 124*   Lipid Profile: No results for input(s): CHOL, HDL, LDLCALC, TRIG, CHOLHDL, LDLDIRECT in the last 72 hours. Thyroid Function Tests: No results for input(s): TSH, T4TOTAL, FREET4, T3FREE, THYROIDAB in the last 72 hours. Anemia Panel: No results for input(s): VITAMINB12, FOLATE, FERRITIN, TIBC, IRON, RETICCTPCT in the last 72 hours. Sepsis Labs: No results for input(s): PROCALCITON, LATICACIDVEN in the last 168 hours.  Recent Results (from the past 240 hour(s))  Surgical pcr screen     Status: Abnormal   Collection Time: 06/02/17  6:23 PM  Result Value Ref Range Status   MRSA, PCR NEGATIVE NEGATIVE Final   Staphylococcus aureus POSITIVE (A) NEGATIVE Final    Comment: (NOTE) The Xpert SA Assay (FDA approved for NASAL specimens in patients 49 years of age and older), is one component of a comprehensive surveillance program. It is not intended to diagnose infection nor to guide or monitor treatment.      Radiology Studies: Dg Chest Port 1 View  Result Date: 06/02/2017 CLINICAL DATA:  Preop, history of hypertension and diabetes EXAM: PORTABLE CHEST 1  VIEW COMPARISON:  05/09/2017 FINDINGS: The heart size and mediastinal contours are within normal limits. Both lungs are clear. The visualized skeletal structures are unremarkable. IMPRESSION: No active disease. Electronically Signed   By: Jasmine Pang M.D.   On: 06/02/2017 17:40   Dg C-arm 1-60 Min-no Report  Result Date: 06/02/2017 Fluoroscopy was utilized by the requesting physician.  No radiographic interpretation.   Dg Hip Operative Unilat W Or W/o Pelvis Right  Result Date: 06/02/2017 CLINICAL DATA:  Right hip pinning; 1 min 42 sec fluoro time, 14.32mG y EXAM: OPERATIVE RIGHT HIP (WITH PELVIS IF PERFORMED) 2 VIEWS TECHNIQUE: Fluoroscopic spot image(s) were submitted for interpretation post-operatively. COMPARISON:  Preoperative images dated 06/02/2017 at 1422 hours FINDINGS: Two fluoroscopic images show placement of 3  screws across the right femoral neck fracture reducing the fracture into near anatomic alignment. The orthopedic hardware is well-seated. No new fracture or evidence of an operative complication. IMPRESSION: Well-aligned right femoral neck fracture following ORIF. Electronically Signed   By: Amie Portland M.D.   On: 06/02/2017 21:27   Dg Femur [place], [place] 2 Views Right  Result Date: 06/02/2017 CLINICAL DATA:  Right hip and knee pain after fall today. EXAM: RIGHT FEMUR PORTABLE 2 VIEW COMPARISON:  None. FINDINGS: A minimally displaced basicervical fracture of the right femur is noted with approximately 2 mm of lateral displacement of the distal fracture fragment. An additional linear lucency is seen just below the level of the trochanters on the AP view that may also represent a nondisplaced fracture involving the subtrochanteric proximal femur. Dedicated right hip radiographs or CT may help for better assessment. A faint linear lucency is also seen involving the superior pubic ramus possibly due to overlying bowel. Fracture is not excluded. No distal femoral fracture or malalignment of  the knee joint is seen. IMPRESSION: 1. An acute basicervical fracture of the right femoral neck with minimal lateral displacement by 1-2 mm. Additional suspicious lucencies are seen in the sub trochanteric portion of the femur and right superior pubic ramus that may also represent nondisplaced fractures or Mach lines from adjacent bowel or skin fold artifacts. Dedicated right hip radiographs may help or CT for better assessment. 2. No apparent fracture of the mid to distal femur nor fracture about the right knee. Electronically Signed   By: Tollie Eth M.D.   On: 06/02/2017 17:45    Scheduled Meds: . amLODipine  10 mg Oral Daily  . carvedilol  50 mg Oral BID WC  . Chlorhexidine Gluconate Cloth  6 each Topical Daily  . enoxaparin (LOVENOX) injection  40 mg Subcutaneous Q24H  . ferrous sulfate  325 mg Oral TID PC  . insulin aspart  0-15 Units Subcutaneous Q4H  . insulin aspart protamine- aspart  20 Units Subcutaneous BID WC  . levothyroxine  137 mcg Oral QAC breakfast  . mupirocin ointment  1 application Nasal BID  . mycophenolate  500 mg Oral BID  . predniSONE  5 mg Oral QHS  . tacrolimus  0.5 mg Oral BH-q7a  . tacrolimus  1 mg Oral QPM   Continuous Infusions: . sodium chloride 75 mL/hr at 06/04/17 0637     LOS: 2 days   Jakai Onofre, Scheryl Marten, MD [facility] Pager [phone]  If 7PM-7AM, please contact night-coverage [email or web address] Password [facility] 06/04/2017, 2:59 PM

## 2017-06-04 NOTE — Progress Notes (Signed)
Physical Therapy Treatment Patient Details Name: [name] MRN: [id number] DOB: 06-Jun-1966 Today's Date: 06/04/2017    History of Present Illness Pt admitted with R femoral neck fx and is not s/p pinning.  Pt with hc of DM and kidney tranplant    PT Comments    Pt cooperative but progressing slowly with mobility 2* pain and deconditioned premorbid status with current NWB on R LE.   Follow Up Recommendations  SNF     Equipment Recommendations  None recommended by PT    Recommendations for Other Services OT consult     Precautions / Restrictions Precautions Precautions: Fall Restrictions Weight Bearing Restrictions: Yes RLE Weight Bearing: Non weight bearing    Mobility  Bed Mobility Overal bed mobility: Needs Assistance Bed Mobility: Supine to Sit     Supine to sit: Mod assist;+2 for physical assistance;+2 for safety/equipment     General bed mobility comments: cues for sequence and use of L LE to self assist. Physical assist to manage R LE and to control trunk  Transfers Overall transfer level: Needs assistance   Transfers: Sit to/from Stand Sit to Stand: Mod assist;+2 physical assistance;+2 safety/equipment;From elevated surface Stand pivot transfers: Mod assist;+2 physical assistance;+2 safety/equipment       General transfer comment: cues for LE management and use of UEs to self assist  Ambulation/Gait             General Gait Details: Stand pvt only.  Pt struggling with adherenct to NWB   Stairs            Wheelchair Mobility    Modified Rankin (Stroke Patients Only)       Balance Overall balance assessment: Needs assistance Sitting-balance support: No upper extremity supported;Feet supported Sitting balance-Leahy Scale: Good     Standing balance support: Bilateral upper extremity supported Standing balance-Leahy Scale: Poor                              Cognition Arousal/Alertness: Awake/alert Behavior  During Therapy: WFL for tasks assessed/performed Overall Cognitive Status: Within Functional Limits for tasks assessed                                        Exercises General Exercises - Lower Extremity Ankle Circles/Pumps: AROM;Both;15 reps;Supine Quad Sets: AROM;Both;10 reps;Supine Heel Slides: AAROM;Right;Supine;20 reps Hip ABduction/ADduction: AAROM;Right;15 reps;Supine    General Comments        Pertinent Vitals/Pain Pain Assessment: 0-10 Pain Score: 8  Pain Location: R hip Pain Descriptors / Indicators: Aching;Sore Pain Intervention(s): Limited activity within patient's tolerance;Monitored during session;Premedicated before session;Ice applied    Home Living                      Prior Function            PT Goals (current goals can now be found in the care plan section) Acute Rehab PT Goals Patient Stated Goal: Less pain PT Goal Formulation: With patient Time For Goal Achievement: 06/17/17 Potential to Achieve Goals: Fair Progress towards PT goals: Progressing toward goals    Frequency    Min 3X/week      PT Plan Current plan remains appropriate    Co-evaluation              AM-PAC PT "6 Clicks" Daily Activity  Outcome Measure  Difficulty turning over in bed (including adjusting bedclothes, sheets and blankets)?: Unable Difficulty moving from lying on back to sitting on the side of the bed? : Unable Difficulty sitting down on and standing up from a chair with arms (e.g., wheelchair, bedside commode, etc,.)?: Unable Help needed moving to and from a bed to chair (including a wheelchair)?: A Lot Help needed walking in hospital room?: Total Help needed climbing 3-5 steps with a railing? : Total 6 Click Score: 7    End of Session Equipment Utilized During Treatment: Gait belt Activity Tolerance: Patient limited by pain;Patient tolerated treatment well Patient left: in chair;with call bell/phone within reach Nurse  Communication: Mobility status PT Visit Diagnosis: Difficulty in walking, not elsewhere classified (R26.2)     Time: 1478-2956 PT Time Calculation (min) (ACUTE ONLY): 24 min  Charges:  $Therapeutic Exercise: 8-22 mins $Therapeutic Activity: 8-22 mins                    G Codes:       Pg [phone]    [name] 06/04/2017, 1:48 PM

## 2017-06-05 DIAGNOSIS — S72001C Fracture of unspecified part of neck of right femur, initial encounter for open fracture type IIIA, IIIB, or IIIC: Secondary | ICD-10-CM

## 2017-06-05 LAB — GLUCOSE, CAPILLARY
GLUCOSE-CAPILLARY: 158 mg/dL — AB (ref 65–99)
GLUCOSE-CAPILLARY: 51 mg/dL — AB (ref 65–99)
GLUCOSE-CAPILLARY: 131 mg/dL — AB (ref 65–99)
GLUCOSE-CAPILLARY: 109 mg/dL — AB (ref 65–99)
GLUCOSE-CAPILLARY: 182 mg/dL — AB (ref 65–99)
Glucose-Capillary: 50 mg/dL — ABNORMAL LOW (ref 65–99)

## 2017-06-05 NOTE — Discharge Summary (Addendum)
Physician Discharge Summary  [name] ZOX:[id number] DOB: December 06, 1965 DOA: 06/02/2017  PCP: Dois Davenport, MD  Admit date: 06/02/2017 Discharge date: 06/06/2017  Admitted From: Home Disposition:  SNF  Recommendations for Outpatient Follow-up:  1. Follow up with PCP in 1-2 weeks 2. Follow up with Orthopedic Surgery as scheduled 3. Recommend repeat bmet in 1-2 weeks  Discharge Condition:Stable CODE STATUS:Full Diet recommendation: Diabetic   Brief/Interim Summary: 51 y.o.femalewith medical history significant of ESRD s/p renal transplant, diabetes, hypertension, depression who presented to the hospital following a mechanical fall after tripping over her cord. This resulted in severe right hip pain with difficulty ambulating. Patient is also present to the [facility] department for further workup.  ED Course:In the emergency department, patient noted to have an acute right femoral neck fracture on x-ray. Orthopedic surgery consulted with recommendations for inpatient stay, likely surgery. Hospital service consulted for consideration for admission.  1. Right femoral neck fracture 1. Status post mechanical fall without loss of consciousness 2. Patient underwent surgery 9/26 3. PT/OT consulted 4. Patient planned for SNF at discharge 2. Hypertension 1. Blood pressure suboptimally controlled, suspect component of pain secondary to acute fracture 2. Continued with analgesics 3. Would titrate bp meds as tolerated 3. Diabetes type 2 1. Appears stable this time 2. We'll continue/scale insulin 3. Continue home 70/30 insulin 4. History kidney transplant 1. Continue with meals as tolerated 5. Immunosuppressive state 1. Per above, patient is chronically immunosuppressed secondary to history of renal transplant.  2. Cont as tolerated  Discharge Diagnoses:  Principal Problem:   Closed displaced fracture of right femoral neck (HCC) Active Problems:   Immunosuppression (HCC)    Essential hypertension   Type II diabetes mellitus (HCC)   Status post kidney transplant   Femoral neck fracture [facility])    Discharge Instructions   Allergies as of 06/06/2017      Reactions   Ace Inhibitors Cough   Adhesive [tape] Itching, Other (See Comments)   Please use paper tape   Lisinopril Cough      Medication List    TAKE these medications   acetaminophen 325 MG tablet Commonly known as:  TYLENOL Take 2 tablets (650 mg total) by mouth every 6 (six) hours as needed for mild pain, moderate pain, fever or headache.   amLODipine 10 MG tablet Commonly known as:  NORVASC Take 1 tablet (10 mg total) by mouth daily.   aspirin EC 81 MG tablet Take 81 mg by mouth daily as needed for moderate pain.   carvedilol 25 MG tablet Commonly known as:  COREG Take 50 mg by mouth 2 (two) times daily.   doxycycline 100 MG tablet Commonly known as:  VIBRA-TABS Take 1 tablet (100 mg total) by mouth every 12 (twelve) hours.   enoxaparin 40 MG/0.4ML injection Commonly known as:  LOVENOX Inject 0.4 mLs (40 mg total) into the skin daily. 30 days post op   FREESTYLE LIBRE READER Devi 1 each by Does not apply route 4 (four) times daily.   FREESTYLE LIBRE SENSOR SYSTEM Misc 1 sensor every 10 days   hydrALAZINE 25 MG tablet Commonly known as:  APRESOLINE Take 1 tablet (25 mg total) by mouth every 12 (twelve) hours.   HYDROcodone-acetaminophen 5-325 MG tablet Commonly known as:  NORCO Take 0.5-1 tablets by mouth every 4 (four) hours as needed for moderate pain or severe pain.   insulin NPH-regular Human (70-30) 100 UNIT/ML injection Commonly known as:  NOVOLIN 70/30 Inject 20 Units into the skin  2 (two) times daily with a meal.   mycophenolate 250 MG capsule Commonly known as:  CELLCEPT Take 500 mg by mouth 2 (two) times daily.   predniSONE 5 MG tablet Commonly known as:  DELTASONE Take 5 mg by mouth at bedtime.   SYNTHROID 137 MCG tablet Generic drug:  levothyroxine Take  137 mcg by mouth daily.   tacrolimus 0.5 MG capsule Commonly known as:  PROGRAF Take 0.5 mg by mouth every morning.   PROGRAF 1 MG capsule Generic drug:  tacrolimus Take 1 mg by mouth every evening.   vitamin B-12 100 MCG tablet Commonly known as:  CYANOCOBALAMIN Take 1 tablet (100 mcg total) by mouth daily.            Discharge Care Instructions        Start     Ordered   06/02/17 0000  HYDROcodone-acetaminophen (NORCO) 5-325 MG tablet  Every 4 hours PRN     06/02/17 2141   06/02/17 0000  enoxaparin (LOVENOX) 40 MG/0.4ML injection  Every 24 hours     06/02/17 2141      Contact information for follow-up providers    Beverely Low, MD. Call in 2 week(s).   Specialty:  Orthopedic Surgery Why:  [phone] Contact information: [address] [place] [address] [phone]        Dois Davenport, MD. Schedule an appointment as soon as possible for a visit in 2 week(s).   Specialty:  Family Medicine Contact information: [address] [place] [address] [phone]            Contact information for after-discharge care    Destination    [facility] .   Specialty:  Skilled Nursing Facility Contact information: [address] [place] [address] [phone]                 Allergies  Allergen Reactions  . Ace Inhibitors Cough  . Adhesive [Tape] Itching and Other (See Comments)    Please use paper tape  . Lisinopril Cough    Consultations:  Orthopedic Surgery  Procedures/Studies: Dg Chest 2 View  Result Date: 05/09/2017 CLINICAL DATA:  Cough.  Left foot second toe swelling. EXAM: CHEST  2 VIEW COMPARISON:  One-view chest x-ray 04/25/2017 FINDINGS: The heart size and mediastinal contours are within normal limits. Both lungs are clear. The visualized skeletal structures are unremarkable. IMPRESSION: Negative two view chest x-ray Electronically Signed   By:  Marin Roberts M.D.   On: 05/09/2017 09:41   Ct Head Wo Contrast  Result Date: 05/28/2017 CLINICAL DATA:  Headaches all over starting today.  Nausea. EXAM: CT HEAD WITHOUT CONTRAST TECHNIQUE: Contiguous axial images were obtained from the base of the skull through the vertex without intravenous contrast. COMPARISON:  05/09/2017 FINDINGS: Brain: Mild diffuse cerebral atrophy. Patchy low-attenuation changes in the deep white matter consistent with small vessel ischemia. No evidence of acute infarction, hemorrhage, hydrocephalus, extra-axial collection or mass lesion/mass effect. Vascular: Vascular calcifications in the internal carotid artery's. Skull: Calvarium appears intact. Sinuses/Orbits: There is diffuse opacification of the paranasal sinuses. Maxillary antral walls are expanded and there is increased density within the sinuses consistent with inspissated mucus, chronic sinusitis, and/ or fungal disease. Appearance is similar to previous study. Mastoid air cells are not opacified. Other: None. IMPRESSION: No acute intracranial abnormalities. Mild chronic atrophy and small vessel ischemic changes. Changes of pansinusitis. No change since prior study. Electronically Signed  By: Burman Nieves M.D.   On: 05/28/2017 03:44   Ct Head Wo Contrast  Result Date: 05/09/2017 CLINICAL DATA:  Pt has hx of falling Fell last night hit rt side of head No marks but has a headache EXAM: CT HEAD WITHOUT CONTRAST TECHNIQUE: Contiguous axial images were obtained from the base of the skull through the vertex without intravenous contrast. COMPARISON:  05/13/2016 FINDINGS: Brain: Mild parenchymal atrophy. Patchy areas of hypoattenuation in periventricular white matter bilaterally. Negative for acute intracranial hemorrhage, mass lesion, acute infarction, midline shift, or mass-effect. Acute infarct may be inapparent on noncontrast CT. Ventricles and sulci symmetric. Vascular: Atherosclerotic and physiologic intracranial  calcifications. Skull: Normal. Negative for fracture or focal lesion. Sinuses/Orbits: Opacification of bilateral maxillary, left ethmoid, and left sphenoid sinuses . Partial opacification of right ethmoid air cells. Orbits unremarkable. Other: None. IMPRESSION: 1. Negative for bleed or other acute intracranial process. 2. Atrophy and nonspecific white matter changes. 3. Pansinusitis Electronically Signed   By: Corlis Leak M.D.   On: 05/09/2017 11:11   Dg Chest Port 1 View  Result Date: 06/02/2017 CLINICAL DATA:  Preop, history of hypertension and diabetes EXAM: PORTABLE CHEST 1 VIEW COMPARISON:  05/09/2017 FINDINGS: The heart size and mediastinal contours are within normal limits. Both lungs are clear. The visualized skeletal structures are unremarkable. IMPRESSION: No active disease. Electronically Signed   By: Jasmine Pang M.D.   On: 06/02/2017 17:40   Dg Hand Complete Left  Result Date: 05/10/2017 CLINICAL DATA:  Fall.  Pain in hand. EXAM: LEFT HAND - COMPLETE 3+ VIEW COMPARISON:  None. FINDINGS: Oblique fracture through the shaft of the small finger metacarpal, 4 mm displacement. Mildly spiral orientation. Diffuse atherosclerotic calcification as can sometimes be encountered in the setting of diabetes. IMPRESSION: 1. Moderately displaced oblique fracture through the shaft of the small finger metacarpal. 2. Diffuse atherosclerotic calcification. Electronically Signed   By: Gaylyn Rong M.D.   On: 05/10/2017 13:27   Dg Foot Complete Left  Result Date: 05/09/2017 CLINICAL DATA:  Swelling, second toe.  Diabetes. EXAM: LEFT FOOT - COMPLETE 3+ VIEW COMPARISON:  05/03/2014 FINDINGS: Interval amputation across the proximal phalanx left great toe. Intra-articular fracture involving the head of the proximal phalanx left second toe, with lateral displacement of a 5 mm sub chondral fragment. There is sclerosis in the head of the proximal phalanx. No definite cortical destruction to suggest osteomyelitis.  Mild diffuse osteopenia. Chronic flattening of the head third metacarpal. Diffuse vascular calcifications. Small calcaneal spurs. No radiodense foreign body or subcutaneous gas. IMPRESSION: 1. Interval displaced intra-articular fracture, head proximal phalanx left second toe since 05/03/2014. Regional sclerosis suggests this may be nonacute. 2. No radiographic evidence of osteomyelitis. 3. Additional chronic and postop changes as above. Electronically Signed   By: Corlis Leak M.D.   On: 05/09/2017 09:43   Dg C-arm 1-60 Min-no Report  Result Date: 06/02/2017 Fluoroscopy was utilized by the requesting physician.  No radiographic interpretation.   Dg Hip Operative Unilat W Or W/o Pelvis Right  Result Date: 06/02/2017 CLINICAL DATA:  Right hip pinning; 1 min 42 sec fluoro time, 14.32mG y EXAM: OPERATIVE RIGHT HIP (WITH PELVIS IF PERFORMED) 2 VIEWS TECHNIQUE: Fluoroscopic spot image(s) were submitted for interpretation post-operatively. COMPARISON:  Preoperative images dated 06/02/2017 at 1422 hours FINDINGS: Two fluoroscopic images show placement of 3 screws across the right femoral neck fracture reducing the fracture into near anatomic alignment. The orthopedic hardware is well-seated. No new fracture or evidence of an operative complication. IMPRESSION:  Well-aligned right femoral neck fracture following ORIF. Electronically Signed   By: Amie Portland M.D.   On: 06/02/2017 21:27   Dg Hip Unilat With Pelvis 2-3 Views Right  Result Date: 06/02/2017 CLINICAL DATA:  Fall with right hip pain EXAM: DG HIP (WITH OR WITHOUT PELVIS) 2-3V RIGHT COMPARISON:  03/26/2017 FINDINGS: Acute right femoral neck fracture at the base of the femoral neck. No dislocation of the femoral head. Pubic symphysis is intact. Old deformity of the right superior pubic ramus. Vascular calcification IMPRESSION: Acute right femoral neck fracture. Electronically Signed   By: Jasmine Pang M.D.   On: 06/02/2017 14:34   Dg Femur Port, Min 2  Views Right  Result Date: 06/02/2017 CLINICAL DATA:  Right hip and knee pain after fall today. EXAM: RIGHT FEMUR PORTABLE 2 VIEW COMPARISON:  None. FINDINGS: A minimally displaced basicervical fracture of the right femur is noted with approximately 2 mm of lateral displacement of the distal fracture fragment. An additional linear lucency is seen just below the level of the trochanters on the AP view that may also represent a nondisplaced fracture involving the subtrochanteric proximal femur. Dedicated right hip radiographs or CT may help for better assessment. A faint linear lucency is also seen involving the superior pubic ramus possibly due to overlying bowel. Fracture is not excluded. No distal femoral fracture or malalignment of the knee joint is seen. IMPRESSION: 1. An acute basicervical fracture of the right femoral neck with minimal lateral displacement by 1-2 mm. Additional suspicious lucencies are seen in the sub trochanteric portion of the femur and right superior pubic ramus that may also represent nondisplaced fractures or Mach lines from adjacent bowel or skin fold artifacts. Dedicated right hip radiographs may help or CT for better assessment. 2. No apparent fracture of the mid to distal femur nor fracture about the right knee. Electronically Signed   By: Tollie Eth M.D.   On: 06/02/2017 17:45    Subjective: With continued post-op pain  Discharge Exam: Vitals:   06/05/17 2221 06/06/17 0514  BP: (!) 154/71 (!) 151/64  Pulse: 82 81  Resp: 15 18  Temp: 98.5 F (36.9 C) 97.7 F (36.5 C)  SpO2: 98% 98%   Vitals:   06/05/17 0600 06/05/17 1342 06/05/17 2221 06/06/17 0514  BP: (!) 161/76 129/69 (!) 154/71 (!) 151/64  Pulse: 82 81 82 81  Resp: Temp: 98.2 F (36.8 C) 98.2 F (36.8 C) 98.5 F (36.9 C) 97.7 F (36.5 C)  TempSrc: Oral Oral Oral Oral  SpO2: 94% 96% 98% 98%  Weight:      Height:        General: Pt is alert, awake, not in acute distress Cardiovascular:  RRR, S1/S2 +, no rubs, no gallops Respiratory: CTA bilaterally, no wheezing, no rhonchi Abdominal: Soft, NT, ND, bowel sounds + Extremities: no edema, no cyanosis   The results of significant diagnostics from this hospitalization (including imaging, microbiology, ancillary and laboratory) are listed below for reference.     Microbiology: Recent Results (from the past 240 hour(s))  Surgical pcr screen     Status: Abnormal   Collection Time: 06/02/17  6:23 PM  Result Value Ref Range Status   MRSA, PCR NEGATIVE NEGATIVE Final   Staphylococcus aureus POSITIVE (A) NEGATIVE Final    Comment: (NOTE) The Xpert SA Assay (FDA approved for NASAL specimens in patients 47 years of age and older), is one component of a comprehensive surveillance program. It is not intended  to diagnose infection nor to guide or monitor treatment.      Labs: BNP (last 3 results)  Recent Labs  03/23/17 1908 04/06/17 1941 05/09/17 0853  BNP 261.5* 137.1* 282.1*   Basic Metabolic Panel:  Recent Labs Lab 06/02/17 1324 06/03/17 0643  NA 139 139  K 3.9 4.1  CL 108 107  CO2 22 23  GLUCOSE 269* 190*  BUN 26* 21*  CREATININE 1.19* 1.13*  CALCIUM 9.2 9.3   Liver Function Tests:  Recent Labs Lab 06/03/17 0643  AST 19  ALT 16  ALKPHOS 121  BILITOT 0.4  PROT 6.8  ALBUMIN 2.7*   No results for input(s): LIPASE, AMYLASE in the last 168 hours. No results for input(s): AMMONIA in the last 168 hours. CBC:  Recent Labs Lab 06/02/17 1324 06/03/17 0643  WBC 7.4 6.7  NEUTROABS 5.7  --   HGB 10.7* 11.8*  HCT 33.8* 37.0  MCV 89.4 89.4  PLT 181 167   Cardiac Enzymes: No results for input(s): CKTOTAL, CKMB, CKMBINDEX, TROPONINI in the last 168 hours. BNP: Invalid input(s): POCBNP CBG:  Recent Labs Lab 06/05/17 1340 06/05/17 1617 06/05/17 2227 06/06/17 0318 06/06/17 0743  GLUCAP 131* 109* 182* 297* 211*   D-Dimer No results for input(s): DDIMER in the last 72 hours. Hgb A1c No  results for input(s): HGBA1C in the last 72 hours. Lipid Profile No results for input(s): CHOL, HDL, LDLCALC, TRIG, CHOLHDL, LDLDIRECT in the last 72 hours. Thyroid function studies No results for input(s): TSH, T4TOTAL, T3FREE, THYROIDAB in the last 72 hours.  Invalid input(s): FREET3 Anemia work up No results for input(s): VITAMINB12, FOLATE, FERRITIN, TIBC, IRON, RETICCTPCT in the last 72 hours. Urinalysis    Component Value Date/Time   COLORURINE STRAW (A) 05/09/2017 0920   APPEARANCEUR CLEAR 05/09/2017 0920   LABSPEC 1.016 05/09/2017 0920   PHURINE 5.0 05/09/2017 0920   GLUCOSEU >=500 (A) 05/09/2017 0920   HGBUR SMALL (A) 05/09/2017 0920   BILIRUBINUR NEGATIVE 05/09/2017 0920   KETONESUR NEGATIVE 05/09/2017 0920   PROTEINUR 100 (A) 05/09/2017 0920   UROBILINOGEN 0.2 03/02/2015 1153   NITRITE NEGATIVE 05/09/2017 0920   LEUKOCYTESUR NEGATIVE 05/09/2017 0920   Sepsis Labs Invalid input(s): PROCALCITONIN,  WBC,  LACTICIDVEN Microbiology Recent Results (from the past 240 hour(s))  Surgical pcr screen     Status: Abnormal   Collection Time: 06/02/17  6:23 PM  Result Value Ref Range Status   MRSA, PCR NEGATIVE NEGATIVE Final   Staphylococcus aureus POSITIVE (A) NEGATIVE Final    Comment: (NOTE) The Xpert SA Assay (FDA approved for NASAL specimens in patients 19 years of age and older), is one component of a comprehensive surveillance program. It is not intended to diagnose infection nor to guide or monitor treatment.      SIGNED:   Jerald Kief, MD  [facility] 06/06/2017, 10:09 AM  If 7PM-7AM, please contact night-coverage [email or web address] Password [email or web address]

## 2017-06-05 NOTE — Clinical Social Work Note (Signed)
Pt bed confirmed at [facility]. Pt to discharge on Sunday AM before 12pm. CSW continuing to follow for discharge needs.   Corlis Hove, LCSWA, LCASA Clinical Social Work Jamelle Rushing coverage) [phone]

## 2017-06-05 NOTE — Care Management Important Message (Signed)
Important Message  Patient Details  Name: [name] MRN: [id number] Date of Birth: 1966/01/09   Medicare Important Message Given:  Yes    Elliot Cousin, RN 06/05/2017, 11:57 AM

## 2017-06-05 NOTE — Progress Notes (Signed)
Physical Therapy Treatment Patient Details Name: [name] MRN: [id number] DOB: 15-Jan-1966 Today's Date: 06/05/2017    History of Present Illness Pt admitted with R femoral neck fx and is not s/p pinning.  Pt with hc of DM and kidney tranplant    PT Comments    Pt continues cooperative but progressing slowly 2* premorbid deconditioning and NWB status on R LE.   Follow Up Recommendations  SNF     Equipment Recommendations  None recommended by PT    Recommendations for Other Services OT consult     Precautions / Restrictions Precautions Precautions: Fall Restrictions Weight Bearing Restrictions: Yes RLE Weight Bearing: Non weight bearing    Mobility  Bed Mobility Overal bed mobility: Needs Assistance Bed Mobility: Supine to Sit     Supine to sit: Min assist;+2 for safety/equipment;+2 for physical assistance     General bed mobility comments: cues for sequence and use of L LE to self assist. Physical assist to manage R LE and to control trunk  Transfers Overall transfer level: Needs assistance Equipment used: Rolling walker (2 wheeled) Transfers: Sit to/from Stand Sit to Stand: Mod assist;+2 physical assistance;+2 safety/equipment;From elevated surface Stand pivot transfers: Mod assist;+2 physical assistance;+2 safety/equipment       General transfer comment: cues for LE management and use of UEs to self assist:  Pt struggling with NWB on R.  Pt stand pvt bed to [facility] to recliner  Ambulation/Gait Ambulation/Gait assistance: Mod assist;+2 physical assistance;+2 safety/equipment Ambulation Distance (Feet): 1 Feet Assistive device: Rolling walker (2 wheeled) Gait Pattern/deviations: Step-to pattern;Decreased step length - right;Decreased step length - left;Shuffle;Trunk flexed;Antalgic     General Gait Details: 2 small steps with step-by-step cues for sequence, posture and increased UE WB.   Stairs            Wheelchair Mobility    Modified Rankin  (Stroke Patients Only)       Balance Overall balance assessment: Needs assistance Sitting-balance support: No upper extremity supported;Feet supported Sitting balance-Leahy Scale: Good     Standing balance support: Bilateral upper extremity supported Standing balance-Leahy Scale: Poor                              Cognition Arousal/Alertness: Awake/alert Behavior During Therapy: WFL for tasks assessed/performed Overall Cognitive Status: Within Functional Limits for tasks assessed                                        Exercises General Exercises - Lower Extremity Ankle Circles/Pumps: AROM;Both;15 reps;Supine Quad Sets: AROM;Both;10 reps;Supine Heel Slides: AAROM;Right;Supine;15 reps Hip ABduction/ADduction: AAROM;Right;15 reps;Supine    General Comments        Pertinent Vitals/Pain Pain Assessment: 0-10 Pain Score: 6  Pain Location: R hip Pain Descriptors / Indicators: Aching;Sore Pain Intervention(s): Limited activity within patient's tolerance;Monitored during session;Premedicated before session;Ice applied    Home Living                      Prior Function            PT Goals (current goals can now be found in the care plan section) Acute Rehab PT Goals Patient Stated Goal: Less pain PT Goal Formulation: With patient Time For Goal Achievement: 06/17/17 Potential to Achieve Goals: Fair Progress towards PT goals: Progressing toward goals    Frequency  Min 3X/week      PT Plan Current plan remains appropriate    Co-evaluation              AM-PAC PT "6 Clicks" Daily Activity  Outcome Measure  Difficulty turning over in bed (including adjusting bedclothes, sheets and blankets)?: Unable Difficulty moving from lying on back to sitting on the side of the bed? : Unable Difficulty sitting down on and standing up from a chair with arms (e.g., wheelchair, bedside commode, etc,.)?: Unable Help needed moving to  and from a bed to chair (including a wheelchair)?: A Lot Help needed walking in hospital room?: Total Help needed climbing 3-5 steps with a railing? : Total 6 Click Score: 7    End of Session Equipment Utilized During Treatment: Gait belt Activity Tolerance: Patient tolerated treatment well;Patient limited by pain Patient left: with call bell/phone within reach;in bed;with nursing/sitter in room Nurse Communication: Mobility status PT Visit Diagnosis: Difficulty in walking, not elsewhere classified (R26.2)     Time: 8295-6213 PT Time Calculation (min) (ACUTE ONLY): 31 min  Charges:  $Therapeutic Exercise: 8-22 mins $Therapeutic Activity: 8-22 mins                    G Codes:       Pg [phone]    Kyren Vaux 06/05/2017, 12:11 PM

## 2017-06-05 NOTE — Progress Notes (Signed)
Physical Therapy Treatment Patient Details Name: [name] MRN: [id number] DOB: July 14, 1966 Today's Date: 06/05/2017    History of Present Illness Pt admitted with R femoral neck fx and is not s/p pinning.  Pt with hc of DM and kidney tranplant    PT Comments    Pt continues cooperative but continues to struggle with NWB but improvement noted with each session.   Follow Up Recommendations  SNF     Equipment Recommendations  None recommended by PT    Recommendations for Other Services OT consult     Precautions / Restrictions Precautions Precautions: Fall Restrictions Weight Bearing Restrictions: Yes RLE Weight Bearing: Non weight bearing    Mobility  Bed Mobility Overal bed mobility: Needs Assistance Bed Mobility: Sit to Supine       Sit to supine: Min assist;Mod assist;+2 for physical assistance;+2 for safety/equipment   General bed mobility comments: cues for sequence and use of L LE to self assist. Physical assist to manage R LE and to control trunk  Transfers Overall transfer level: Needs assistance Equipment used: Rolling walker (2 wheeled) Transfers: Sit to/from Stand Sit to Stand: Min assist;Mod assist;+2 physical assistance;+2 safety/equipment;From elevated surface         General transfer comment: cues for LE management and use of UEs to self assist  Ambulation/Gait Ambulation/Gait assistance: Mod assist;+2 physical assistance;+2 safety/equipment Ambulation Distance (Feet): 2 Feet Assistive device: Rolling walker (2 wheeled) Gait Pattern/deviations: Step-to pattern;Decreased step length - right;Decreased step length - left;Shuffle;Trunk flexed;Antalgic     General Gait Details: step-by-step cues for sequence, posture and increased UE WB.[name]  Pt continues to struggle with NWB   Stairs            Wheelchair Mobility    Modified Rankin (Stroke Patients Only)       Balance Overall balance assessment: Needs  assistance Sitting-balance support: No upper extremity supported;Feet supported Sitting balance-Leahy Scale: Good     Standing balance support: Bilateral upper extremity supported Standing balance-Leahy Scale: Poor                              Cognition Arousal/Alertness: Awake/alert Behavior During Therapy: WFL for tasks assessed/performed Overall Cognitive Status: Within Functional Limits for tasks assessed                                        Exercises      General Comments        Pertinent Vitals/Pain Pain Assessment: 0-10 Pain Score: 6  Pain Location: R hip Pain Descriptors / Indicators: Aching;Sore Pain Intervention(s): Limited activity within patient's tolerance;Monitored during session;Premedicated before session    Home Living                      Prior Function            PT Goals (current goals can now be found in the care plan section) Acute Rehab PT Goals Patient Stated Goal: Less pain PT Goal Formulation: With patient Time For Goal Achievement: 06/17/17 Potential to Achieve Goals: Fair Progress towards PT goals: Progressing toward goals    Frequency    Min 3X/week      PT Plan Current plan remains appropriate    Co-evaluation              AM-PAC PT "6 Clicks"  Daily Activity  Outcome Measure  Difficulty turning over in bed (including adjusting bedclothes, sheets and blankets)?: Unable Difficulty moving from lying on back to sitting on the side of the bed? : Unable Difficulty sitting down on and standing up from a chair with arms (e.g., wheelchair, bedside commode, etc,.)?: Unable Help needed moving to and from a bed to chair (including a wheelchair)?: A Lot Help needed walking in hospital room?: Total Help needed climbing 3-5 steps with a railing? : Total 6 Click Score: 7    End of Session Equipment Utilized During Treatment: Gait belt Activity Tolerance: Patient tolerated treatment  well;Patient limited by pain Patient left: with call bell/phone within reach;in bed;with nursing/sitter in room Nurse Communication: Mobility status PT Visit Diagnosis: Difficulty in walking, not elsewhere classified (R26.2)     Time: 1450-1504 PT Time Calculation (min) (ACUTE ONLY): 14 min  Charges:  $Gait Training: 8-22 mins                    G Codes:       Pg [phone]     Naol Ontiveros 06/05/2017, 3:21 PM

## 2017-06-06 LAB — GLUCOSE, CAPILLARY
GLUCOSE-CAPILLARY: 297 mg/dL — AB (ref 65–99)
GLUCOSE-CAPILLARY: 211 mg/dL — AB (ref 65–99)

## 2017-06-06 NOTE — Progress Notes (Signed)
PROGRESS NOTE    [name]  ZOX:[id number] DOB: 1966-01-11 DOA: 06/02/2017 PCP: Dois Davenport, MD     Brief Narrative:  51 y.o. female with medical history significant of ESRD s/p renal transplant, diabetes, hypertension, depression who presented to the hospital following a mechanical fall after tripping over her cord. This resulted in severe right hip pain with difficulty ambulating. Patient is also present to the [facility] department for further workup.  ED Course: In the emergency department, patient noted to have an acute right femoral neck fracture on x-ray. Orthopedic surgery consulted with recommendations for inpatient stay, likely surgery. Hospital service consulted for consideration for admission.  Assessment & Plan:   Principal Problem:   Closed displaced fracture of right femoral neck (HCC) Active Problems:   Immunosuppression (HCC)   Essential hypertension   Type II diabetes mellitus (HCC)   Status post kidney transplant   Femoral neck fracture (HCC)   1. Right femoral neck fracture 1. Status post mechanical fall without loss of consciousness 2. Patient underwent surgery 9/26 3. PT/OT consulted 4. Pt for SNF for rehab 2. Hypertension 1. Blood pressure suboptimally controlled, suspect component of pain secondary to acute fracture 2. We'll continue with analgesics as needed 3. Continue PRN hydralazine 3. Diabetes type 2 1. Appears stable this time 2. We'll continue/scale insulin 3. Continue home 70/30 insulin 4. History kidney transplant 1. Continue with meals as tolerated 5. Immunosuppressive state 1. Per above, patient is chronically immunosuppressed secondary to history of renal transplant.  2. Cont as tolerated  DVT prophylaxis: lovenox subq Code Status: Full Family Communication: Pt in room, family not at bedside Disposition Plan: anticipate SNF  Consultants:   Orthopedic Surgery  Procedures:   Cannulated right hip pinning  9/26  Antimicrobials: Anti-infectives    Start     Dose/Rate Route Frequency Ordered Stop   06/03/17 0600  ceFAZolin (ANCEF) IVPB 2g/100 mL premix  Status:  Discontinued     2 g 200 mL/hr over 30 Minutes Intravenous On call to O.R. 06/02/17 1815 06/02/17 1841   06/03/17 0200  ceFAZolin (ANCEF) IVPB 2g/100 mL premix     2 g 200 mL/hr over 30 Minutes Intravenous Every 6 hours 06/02/17 2310 06/03/17 0813   06/02/17 1845  ceFAZolin (ANCEF) IVPB 2g/100 mL premix     2 g 200 mL/hr over 30 Minutes Intravenous On call to O.R. 06/02/17 1841 06/02/17 1950   06/02/17 1841  ceFAZolin (ANCEF) 2-4 GM/100ML-% IVPB    Comments:  Marney Doctor   : cabinet override      06/02/17 1841 06/02/17 1950      Subjective: Eager to start rehab  Objective: Vitals:   06/05/17 0600 06/05/17 1342 06/05/17 2221 06/06/17 0514  BP: (!) 161/76 129/69 (!) 154/71 (!) 151/64  Pulse: 82 81 82 81  Resp: Temp: 98.2 F (36.8 C) 98.2 F (36.8 C) 98.5 F (36.9 C) 97.7 F (36.5 C)  TempSrc: Oral Oral Oral Oral  SpO2: 94% 96% 98% 98%  Weight:      Height:        Intake/Output Summary (Last 24 hours) at 06/06/17 1008 Last data filed at 06/06/17 0858  Gross per 24 hour  Intake              600 ml  Output                1 ml  Net  599 ml   Filed Weights   06/02/17 1317 06/02/17 1745  Weight: 84.8 kg (187 lb) 84.8 kg (187 lb)    Examination: General exam: Conversant, in no acute distress Respiratory system: normal chest rise, clear, no audible wheezing     Data Reviewed: I have personally reviewed following labs and imaging studies  CBC:  Recent Labs Lab 06/02/17 1324 06/03/17 0643  WBC 7.4 6.7  NEUTROABS 5.7  --   HGB 10.7* 11.8*  HCT 33.8* 37.0  MCV 89.4 89.4  PLT 181 167   Basic Metabolic Panel:  Recent Labs Lab 06/02/17 1324 06/03/17 0643  NA 139 139  K 3.9 4.1  CL 108 107  CO2 22 23  GLUCOSE 269* 190*  BUN 26* 21*  CREATININE 1.19* 1.13*  CALCIUM  9.2 9.3   GFR: Estimated Creatinine Clearance: 68.6 mL/min (A) (by C-G formula based on SCr of 1.13 mg/dL (H)). Liver Function Tests:  Recent Labs Lab 06/03/17 0643  AST 19  ALT 16  ALKPHOS 121  BILITOT 0.4  PROT 6.8  ALBUMIN 2.7*   No results for input(s): LIPASE, AMYLASE in the last 168 hours. No results for input(s): AMMONIA in the last 168 hours. Coagulation Profile:  Recent Labs Lab 06/02/17 1324  INR 0.96   Cardiac Enzymes: No results for input(s): CKTOTAL, CKMB, CKMBINDEX, TROPONINI in the last 168 hours. BNP (last 3 results) No results for input(s): PROBNP in the last 8760 hours. HbA1C: No results for input(s): HGBA1C in the last 72 hours. CBG:  Recent Labs Lab 06/05/17 1340 06/05/17 1617 06/05/17 2227 06/06/17 0318 06/06/17 0743  GLUCAP 131* 109* 182* 297* 211*   Lipid Profile: No results for input(s): CHOL, HDL, LDLCALC, TRIG, CHOLHDL, LDLDIRECT in the last 72 hours. Thyroid Function Tests: No results for input(s): TSH, T4TOTAL, FREET4, T3FREE, THYROIDAB in the last 72 hours. Anemia Panel: No results for input(s): VITAMINB12, FOLATE, FERRITIN, TIBC, IRON, RETICCTPCT in the last 72 hours. Sepsis Labs: No results for input(s): PROCALCITON, LATICACIDVEN in the last 168 hours.  Recent Results (from the past 240 hour(s))  Surgical pcr screen     Status: Abnormal   Collection Time: 06/02/17  6:23 PM  Result Value Ref Range Status   MRSA, PCR NEGATIVE NEGATIVE Final   Staphylococcus aureus POSITIVE (A) NEGATIVE Final    Comment: (NOTE) The Xpert SA Assay (FDA approved for NASAL specimens in patients 53 years of age and older), is one component of a comprehensive surveillance program. It is not intended to diagnose infection nor to guide or monitor treatment.      Radiology Studies: No results found.  Scheduled Meds: . amLODipine  10 mg Oral Daily  . carvedilol  50 mg Oral BID WC  . Chlorhexidine Gluconate Cloth  6 each Topical Daily  .  enoxaparin (LOVENOX) injection  40 mg Subcutaneous Q24H  . ferrous sulfate  325 mg Oral TID PC  . insulin aspart  0-15 Units Subcutaneous Q4H  . insulin aspart protamine- aspart  20 Units Subcutaneous BID WC  . levothyroxine  137 mcg Oral QAC breakfast  . mupirocin ointment  1 application Nasal BID  . mycophenolate  500 mg Oral BID  . predniSONE  5 mg Oral QHS  . tacrolimus  0.5 mg Oral BH-q7a  . tacrolimus  1 mg Oral QPM   Continuous Infusions: . sodium chloride 75 mL/hr at 06/04/17 2024     LOS: 4 days   CHIU, Scheryl Marten, MD [facility] Pager [phone]  If 7PM-7AM, please contact night-coverage [email or web address] Password [email or web address] 06/06/2017, 10:08 AM

## 2017-06-06 NOTE — Progress Notes (Signed)
Subjective: 4 Days Post-Op Procedure(s) (LRB): CANNULATED RIGHT HIP PINNING (Right) Patient reports pain as moderate.  Reports incisional pain this AM. Vomited this AM, has been feeling nauseous.  Objective: Vital signs in last 24 hours: Temp:  [97.7 F (36.5 C)-98.5 F (36.9 C)] 97.7 F (36.5 C) (09/30 0514) Pulse Rate:  [81-82] 81 (09/30 0514) Resp:  [15-18] 18 (09/30 0514) BP: (129-154)/(64-71) 151/64 (09/30 0514) SpO2:  [96 %-98 %] 98 % (09/30 0514)  Intake/Output from previous day: 09/29 0701 - 09/30 0700 In: 360 [P.O.:360] Out: 1 [Stool:1] Intake/Output this shift: Total I/O In: 360 [P.O.:360] Out: -   No results for input(s): HGB in the last 72 hours. No results for input(s): WBC, RBC, HCT, PLT in the last 72 hours. No results for input(s): NA, K, CL, CO2, BUN, CREATININE, GLUCOSE, CALCIUM in the last 72 hours. No results for input(s): LABPT, INR in the last 72 hours.  Neurologically intact ABD soft Neurovascular intact Sensation intact distally Intact pulses distally Dorsiflexion/Plantar flexion intact Incision: dressing C/D/I and no drainage No cellulitis present Compartment soft no sign of DVT  Assessment/Plan: 4 Days Post-Op Procedure(s) (LRB): CANNULATED RIGHT HIP PINNING (Right) Advance diet Up with therapy D/C IV fluids  Antiemetics prn Per admitting service to d/c to snf today orthopedically stable Follow with Dr. Ranell Patrick as instructed post-op  Andrez Grime M. 06/06/2017, 9:21 AM

## 2017-06-06 NOTE — Clinical Social Work Placement (Signed)
Nurse to call report to [phone], Room 610    CLINICAL SOCIAL WORK PLACEMENT  NOTE  Date:  06/06/2017  Patient Details  Name: [name] MRN: [id number] Date of Birth: 1966/04/02  Clinical Social Work is seeking post-discharge placement for this patient at the Skilled  Nursing Facility level of care (*CSW will initial, date and re-position this form in  chart as items are completed):  Yes   Patient/family provided with [place] Clinical Social Work Department's list of facilities offering this level of care within the geographic area requested by the patient (or if unable, by the patient's family).  Yes   Patient/family informed of their freedom to choose among providers that offer the needed level of care, that participate in Medicare, Medicaid or managed care program needed by the patient, have an available bed and are willing to accept the patient.  Yes   Patient/family informed of [facility]'s ownership interest in [facility] and [facility], as well as of the fact that they are under no obligation to receive care at these facilities.  PASRR submitted to EDS on       PASRR number received on       Existing PASRR number confirmed on       FL2 transmitted to all facilities in geographic area requested by pt/family on 06/03/17     FL2 transmitted to all facilities within larger geographic area on       Patient informed that his/her managed care company has contracts with or will negotiate with certain facilities, including the following:        Yes   Patient/family informed of bed offers received.  Patient chooses bed at [facility] [place]     Physician recommends and patient chooses bed at      Patient to be transferred to [facility] [place] on 06/06/17.  Patient to be transferred to facility by [facility]     Patient family notified on 06/06/17 of transfer.  Name of family member notified:  Casimiro Needle     PHYSICIAN       Additional Comment:     _______________________________________________ Baldemar Lenis, LCSW 06/06/2017, 10:18 AM

## 2017-06-06 NOTE — Progress Notes (Signed)
Physical Therapy Treatment Patient Details Name: [name] MRN: [id number] DOB: 02/22/1966 Today's Date: 06/06/2017    History of Present Illness Pt admitted with R femoral neck fx and is not s/p pinning.  Pt with hc of DM and kidney tranplant    PT Comments    Pt cooperative and agreeable to therex program this am prior to transport to SNF later this am.   Follow Up Recommendations  SNF     Equipment Recommendations  None recommended by PT    Recommendations for Other Services OT consult     Precautions / Restrictions Precautions Precautions: Fall Restrictions Weight Bearing Restrictions: Yes RLE Weight Bearing: Non weight bearing    Mobility  Bed Mobility                  Transfers                    Ambulation/Gait                 Stairs            Wheelchair Mobility    Modified Rankin (Stroke Patients Only)       Balance                                            Cognition Arousal/Alertness: Awake/alert Behavior During Therapy: WFL for tasks assessed/performed Overall Cognitive Status: Within Functional Limits for tasks assessed                                        Exercises General Exercises - Lower Extremity Ankle Circles/Pumps: AROM;Both;15 reps;Supine Quad Sets: AROM;Both;10 reps;Supine Short Arc Quad: AAROM;Both;10 reps;Supine Heel Slides: AAROM;Right;Supine;15 reps Hip ABduction/ADduction: AAROM;Right;15 reps;Supine    General Comments        Pertinent Vitals/Pain Pain Assessment: 0-10 Pain Score: 5  Pain Location: R hip Pain Descriptors / Indicators: Aching;Sore Pain Intervention(s): Limited activity within patient's tolerance;Monitored during session;Premedicated before session    Home Living                      Prior Function            PT Goals (current goals can now be found in the care plan section) Acute Rehab PT Goals Patient Stated  Goal: Less pain PT Goal Formulation: With patient Time For Goal Achievement: 06/17/17 Potential to Achieve Goals: Fair Progress towards PT goals: Progressing toward goals    Frequency    Min 3X/week      PT Plan Current plan remains appropriate    Co-evaluation              AM-PAC PT "6 Clicks" Daily Activity  Outcome Measure  Difficulty turning over in bed (including adjusting bedclothes, sheets and blankets)?: Unable Difficulty moving from lying on back to sitting on the side of the bed? : Unable Difficulty sitting down on and standing up from a chair with arms (e.g., wheelchair, bedside commode, etc,.)?: Unable Help needed moving to and from a bed to chair (including a wheelchair)?: A Lot Help needed walking in hospital room?: Total Help needed climbing 3-5 steps with a railing? : Total 6 Click Score: 7    End of Session   Activity Tolerance: Patient  tolerated treatment well Patient left: in chair;with call bell/phone within reach   PT Visit Diagnosis: Difficulty in walking, not elsewhere classified (R26.2)     Time: 1610-9604 PT Time Calculation (min) (ACUTE ONLY): 20 min  Charges:  $Therapeutic Exercise: 8-22 mins                    G Codes:       Pg [phone]    Anneta Rounds 06/06/2017, 1:09 PM

## 2017-06-06 NOTE — Progress Notes (Signed)
Discharged from floor via stretcher for transport to SNF by ambulance. EMT & belongings with pt. No changes in assessment. Tauri Ethington  

## 2017-08-20 IMAGING — CR DG CHEST 2V
2 series · 2 of 2 positions shown · non-contrast
Comparison: Chest radiograph 09/25/2015.

CLINICAL DATA: Evaluate for pneumonia.  Cough and sneezing.

EXAM:
CHEST  2 VIEW

[w chest lat]
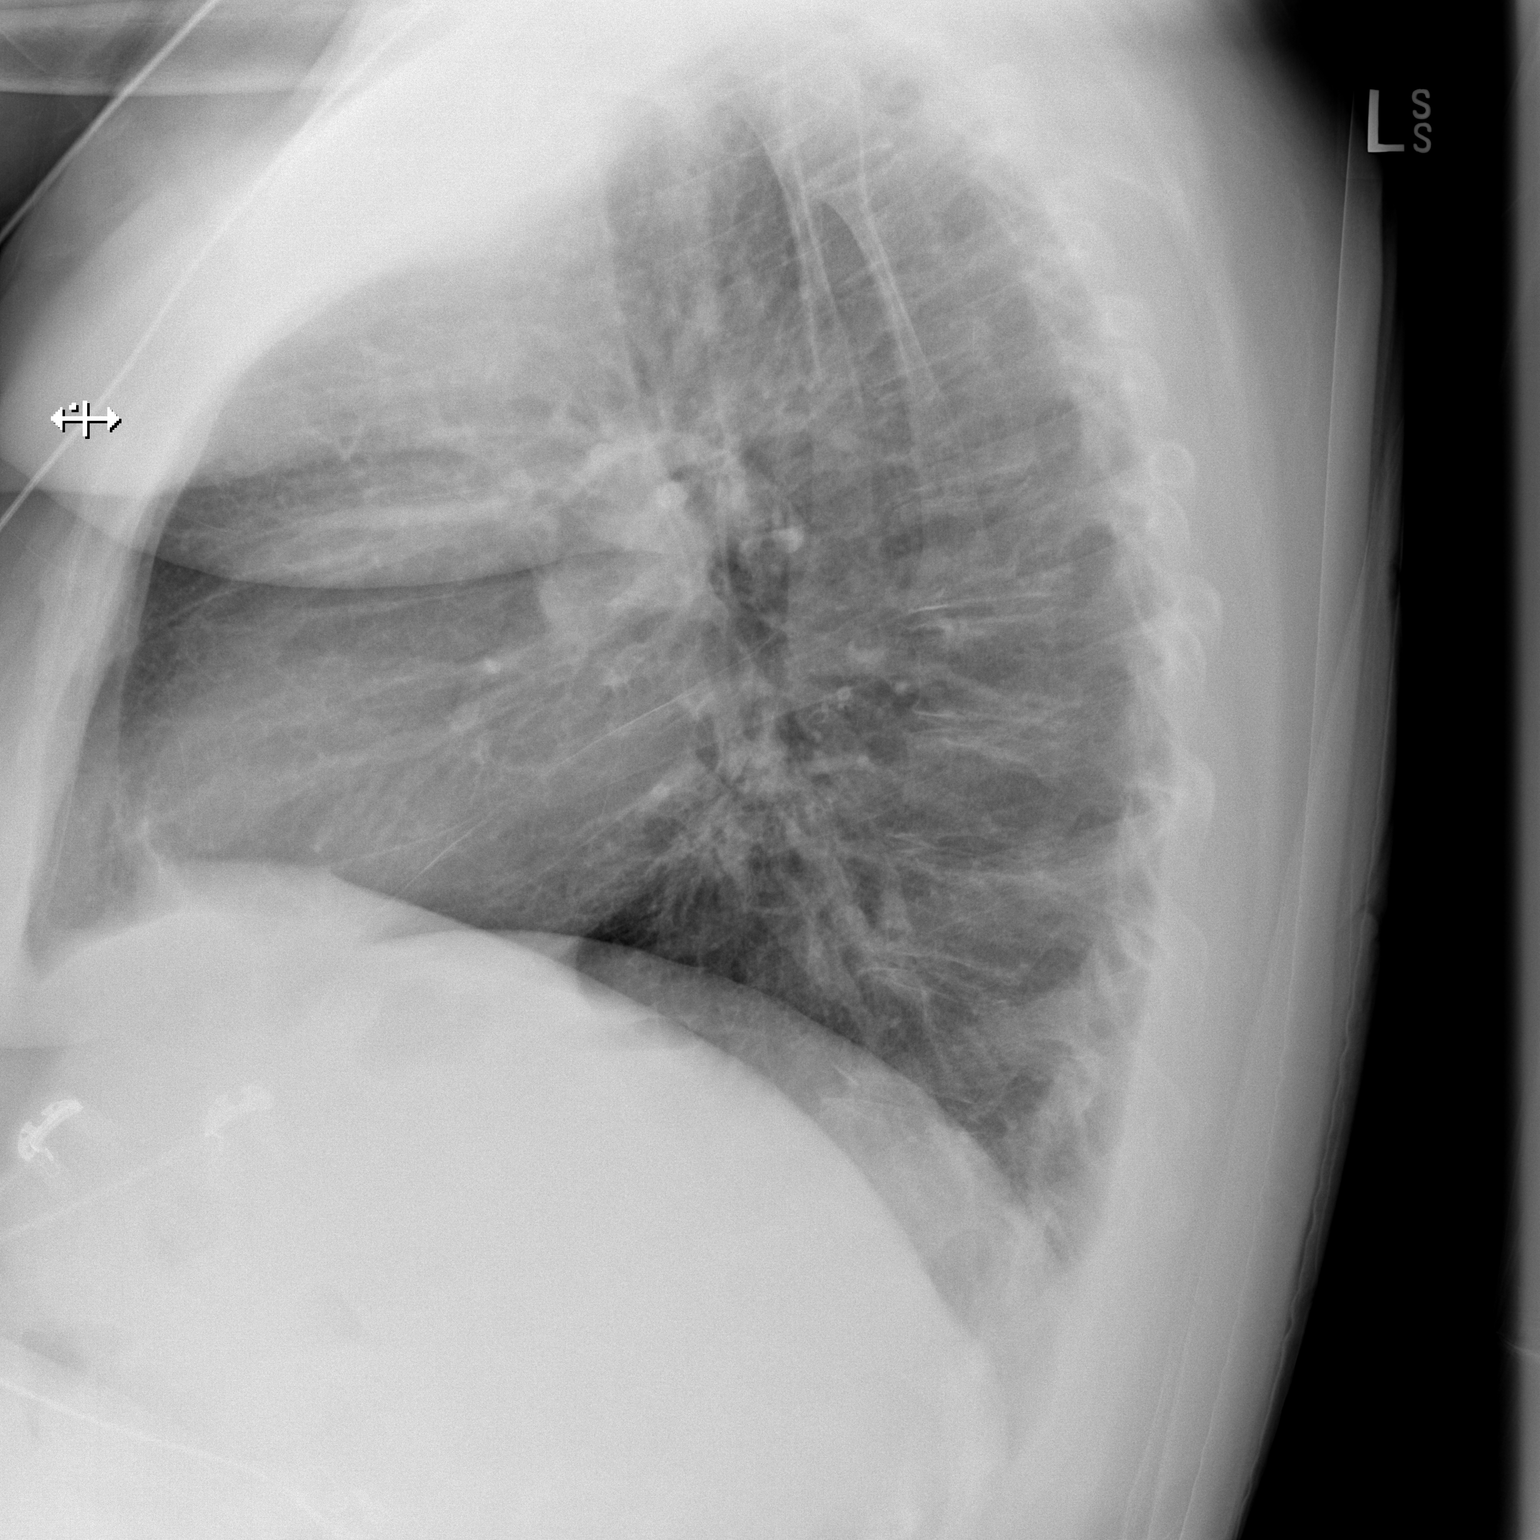

[x chest ap]
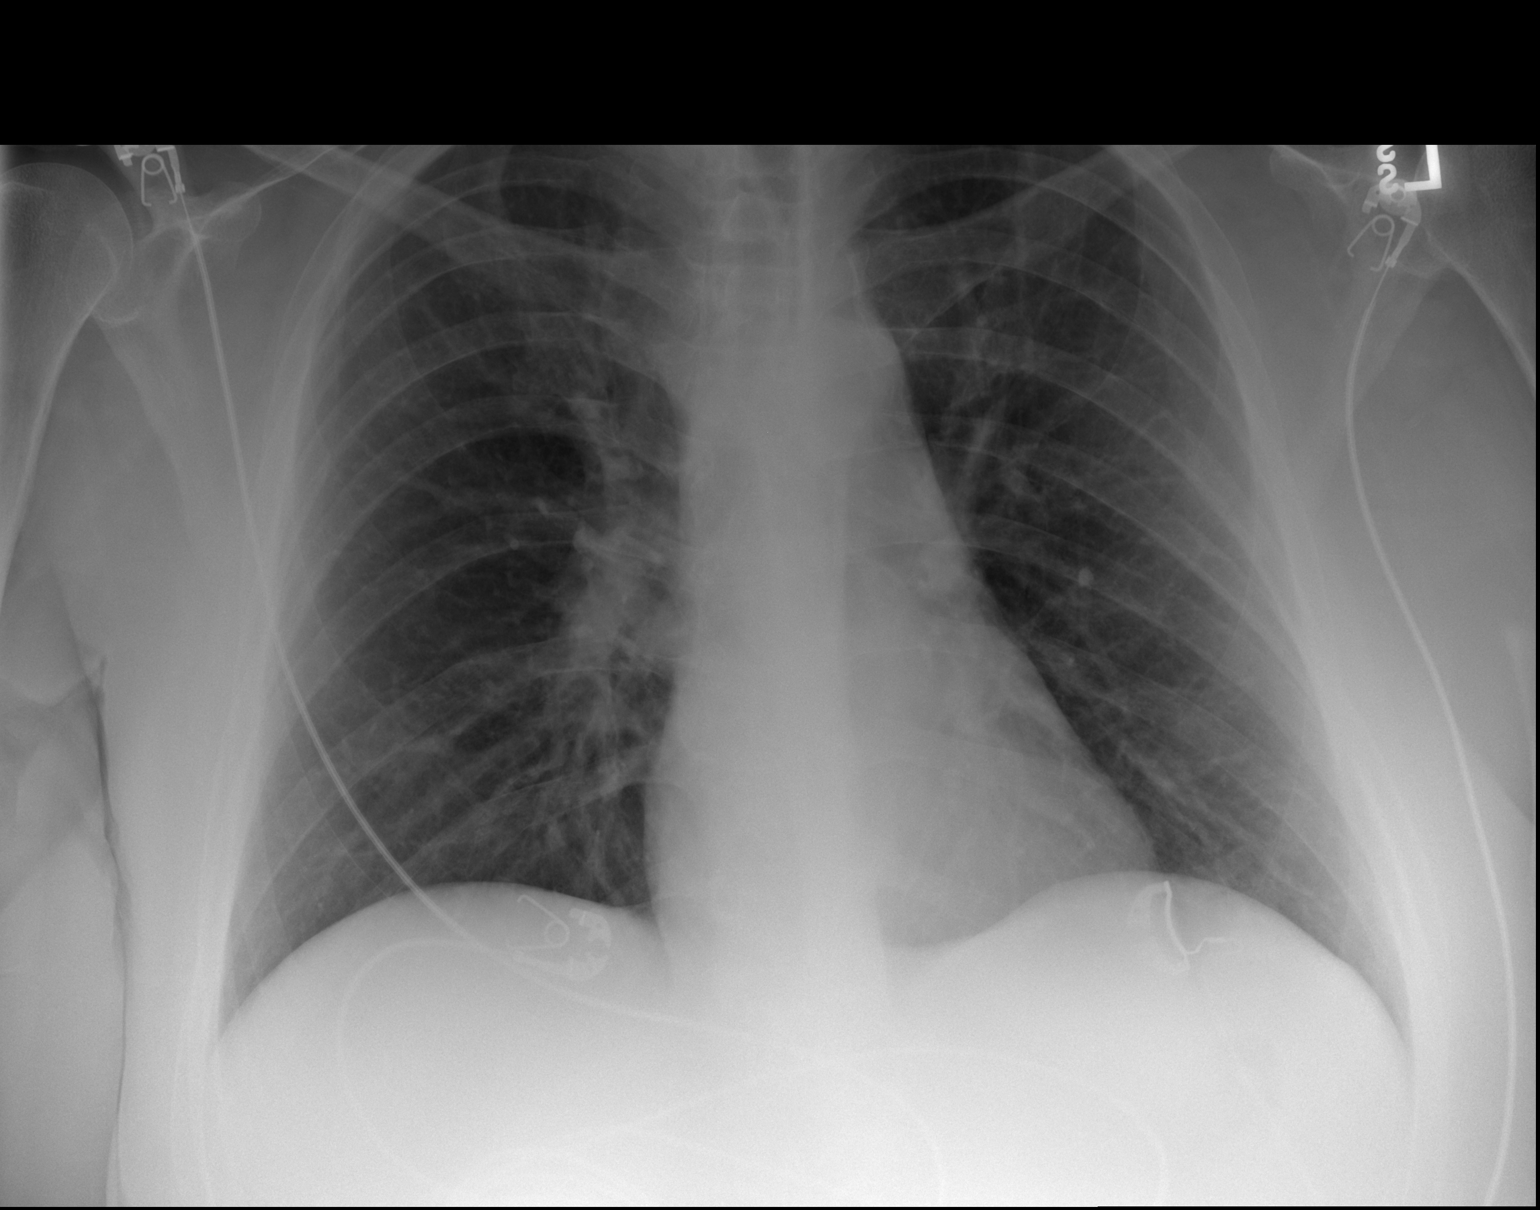

[2 of 2 positions shown; findings below may reference images not displayed]

FINDINGS: The heart size and mediastinal contours are within normal limits.
Both lungs are clear. The visualized skeletal structures are
unremarkable.
IMPRESSION: No active cardiopulmonary disease.

## 2017-08-31 ENCOUNTER — Emergency Department (HOSPITAL_COMMUNITY): Payer: Medicare Other

## 2017-08-31 ENCOUNTER — Inpatient Hospital Stay (HOSPITAL_COMMUNITY)
Admission: EM | Admit: 2017-08-31 | Discharge: 2017-09-04 | DRG: 871 | Disposition: A | Discharge status: Home / Self Care | Payer: Medicare Other | Attending: Family Medicine | Admitting: Family Medicine

## 2017-08-31 DIAGNOSIS — Y830 Surgical operation with transplant of whole organ as the cause of abnormal reaction of the patient, or of later complication, without mention of misadventure at the time of the procedure: Secondary | ICD-10-CM | POA: Diagnosis present

## 2017-08-31 DIAGNOSIS — Z940 Kidney transplant status: Secondary | ICD-10-CM

## 2017-08-31 DIAGNOSIS — E1022 Type 1 diabetes mellitus with diabetic chronic kidney disease: Secondary | ICD-10-CM | POA: Diagnosis present

## 2017-08-31 DIAGNOSIS — E43 Unspecified severe protein-calorie malnutrition: Secondary | ICD-10-CM

## 2017-08-31 DIAGNOSIS — E440 Moderate protein-calorie malnutrition: Secondary | ICD-10-CM | POA: Diagnosis present

## 2017-08-31 DIAGNOSIS — E1051 Type 1 diabetes mellitus with diabetic peripheral angiopathy without gangrene: Secondary | ICD-10-CM | POA: Diagnosis present

## 2017-08-31 DIAGNOSIS — Z6827 Body mass index (BMI) 27.0-27.9, adult: Secondary | ICD-10-CM

## 2017-08-31 DIAGNOSIS — E871 Hypo-osmolality and hyponatremia: Secondary | ICD-10-CM | POA: Diagnosis present

## 2017-08-31 DIAGNOSIS — R6520 Severe sepsis without septic shock: Secondary | ICD-10-CM | POA: Diagnosis present

## 2017-08-31 DIAGNOSIS — Z823 Family history of stroke: Secondary | ICD-10-CM

## 2017-08-31 DIAGNOSIS — E10621 Type 1 diabetes mellitus with foot ulcer: Secondary | ICD-10-CM | POA: Diagnosis present

## 2017-08-31 DIAGNOSIS — Z961 Presence of intraocular lens: Secondary | ICD-10-CM | POA: Diagnosis present

## 2017-08-31 DIAGNOSIS — L97519 Non-pressure chronic ulcer of other part of right foot with unspecified severity: Secondary | ICD-10-CM | POA: Diagnosis present

## 2017-08-31 DIAGNOSIS — E785 Hyperlipidemia, unspecified: Secondary | ICD-10-CM | POA: Diagnosis present

## 2017-08-31 DIAGNOSIS — I959 Hypotension, unspecified: Secondary | ICD-10-CM | POA: Diagnosis not present

## 2017-08-31 DIAGNOSIS — I129 Hypertensive chronic kidney disease with stage 1 through stage 4 chronic kidney disease, or unspecified chronic kidney disease: Secondary | ICD-10-CM | POA: Diagnosis present

## 2017-08-31 DIAGNOSIS — Z79891 Long term (current) use of opiate analgesic: Secondary | ICD-10-CM

## 2017-08-31 DIAGNOSIS — F329 Major depressive disorder, single episode, unspecified: Secondary | ICD-10-CM | POA: Diagnosis present

## 2017-08-31 DIAGNOSIS — Z7982 Long term (current) use of aspirin: Secondary | ICD-10-CM

## 2017-08-31 DIAGNOSIS — Z87891 Personal history of nicotine dependence: Secondary | ICD-10-CM | POA: Diagnosis not present

## 2017-08-31 DIAGNOSIS — Z808 Family history of malignant neoplasm of other organs or systems: Secondary | ICD-10-CM

## 2017-08-31 DIAGNOSIS — N179 Acute kidney failure, unspecified: Secondary | ICD-10-CM

## 2017-08-31 DIAGNOSIS — N183 Chronic kidney disease, stage 3 unspecified: Secondary | ICD-10-CM | POA: Diagnosis present

## 2017-08-31 DIAGNOSIS — T8619 Other complication of kidney transplant: Secondary | ICD-10-CM | POA: Diagnosis present

## 2017-08-31 DIAGNOSIS — J9601 Acute respiratory failure with hypoxia: Secondary | ICD-10-CM | POA: Diagnosis present

## 2017-08-31 DIAGNOSIS — Z9842 Cataract extraction status, left eye: Secondary | ICD-10-CM

## 2017-08-31 DIAGNOSIS — L97509 Non-pressure chronic ulcer of other part of unspecified foot with unspecified severity: Secondary | ICD-10-CM

## 2017-08-31 DIAGNOSIS — E1069 Type 1 diabetes mellitus with other specified complication: Secondary | ICD-10-CM | POA: Diagnosis present

## 2017-08-31 DIAGNOSIS — E1010 Type 1 diabetes mellitus with ketoacidosis without coma: Secondary | ICD-10-CM | POA: Diagnosis present

## 2017-08-31 DIAGNOSIS — E875 Hyperkalemia: Secondary | ICD-10-CM | POA: Diagnosis present

## 2017-08-31 DIAGNOSIS — R011 Cardiac murmur, unspecified: Secondary | ICD-10-CM | POA: Diagnosis present

## 2017-08-31 DIAGNOSIS — Z794 Long term (current) use of insulin: Secondary | ICD-10-CM

## 2017-08-31 DIAGNOSIS — S72001A Fracture of unspecified part of neck of right femur, initial encounter for closed fracture: Secondary | ICD-10-CM

## 2017-08-31 DIAGNOSIS — E039 Hypothyroidism, unspecified: Secondary | ICD-10-CM | POA: Diagnosis present

## 2017-08-31 DIAGNOSIS — Z89421 Acquired absence of other right toe(s): Secondary | ICD-10-CM

## 2017-08-31 DIAGNOSIS — A419 Sepsis, unspecified organism: Secondary | ICD-10-CM

## 2017-08-31 DIAGNOSIS — Z79899 Other long term (current) drug therapy: Secondary | ICD-10-CM

## 2017-08-31 DIAGNOSIS — J189 Pneumonia, unspecified organism: Secondary | ICD-10-CM | POA: Insufficient documentation

## 2017-08-31 DIAGNOSIS — E669 Obesity, unspecified: Secondary | ICD-10-CM | POA: Diagnosis present

## 2017-08-31 DIAGNOSIS — Z91048 Other nonmedicinal substance allergy status: Secondary | ICD-10-CM

## 2017-08-31 DIAGNOSIS — K529 Noninfective gastroenteritis and colitis, unspecified: Secondary | ICD-10-CM | POA: Diagnosis present

## 2017-08-31 DIAGNOSIS — Z79890 Hormone replacement therapy (postmenopausal): Secondary | ICD-10-CM

## 2017-08-31 DIAGNOSIS — Z91040 Latex allergy status: Secondary | ICD-10-CM

## 2017-08-31 DIAGNOSIS — A411 Sepsis due to other specified staphylococcus: Secondary | ICD-10-CM | POA: Diagnosis not present

## 2017-08-31 DIAGNOSIS — Z825 Family history of asthma and other chronic lower respiratory diseases: Secondary | ICD-10-CM

## 2017-08-31 DIAGNOSIS — D899 Disorder involving the immune mechanism, unspecified: Secondary | ICD-10-CM

## 2017-08-31 DIAGNOSIS — J181 Lobar pneumonia, unspecified organism: Secondary | ICD-10-CM | POA: Diagnosis present

## 2017-08-31 DIAGNOSIS — D649 Anemia, unspecified: Secondary | ICD-10-CM | POA: Diagnosis present

## 2017-08-31 DIAGNOSIS — D849 Immunodeficiency, unspecified: Secondary | ICD-10-CM | POA: Diagnosis present

## 2017-08-31 DIAGNOSIS — Z89412 Acquired absence of left great toe: Secondary | ICD-10-CM

## 2017-08-31 DIAGNOSIS — Z9841 Cataract extraction status, right eye: Secondary | ICD-10-CM

## 2017-08-31 DIAGNOSIS — D696 Thrombocytopenia, unspecified: Secondary | ICD-10-CM | POA: Diagnosis present

## 2017-08-31 DIAGNOSIS — Z8701 Personal history of pneumonia (recurrent): Secondary | ICD-10-CM

## 2017-08-31 DIAGNOSIS — Z7952 Long term (current) use of systemic steroids: Secondary | ICD-10-CM

## 2017-08-31 LAB — CBC
HEMATOCRIT: 28.5 % — AB (ref 36.0–46.0)
HEMOGLOBIN: 8.5 g/dL — AB (ref 12.0–15.0)
MCH: 28.6 pg (ref 26.0–34.0)
MCHC: 29.8 g/dL — ABNORMAL LOW (ref 30.0–36.0)
MCV: 96 fL (ref 78.0–100.0)
Platelets: 174 10*3/uL (ref 150–400)
RBC: 2.97 MIL/uL — ABNORMAL LOW (ref 3.87–5.11)
RDW: 13.7 % (ref 11.5–15.5)
WBC: 7.3 10*3/uL (ref 4.0–10.5)

## 2017-08-31 LAB — COMPREHENSIVE METABOLIC PANEL
ALBUMIN: 2.7 g/dL — AB (ref 3.5–5.0)
ALT: 14 U/L (ref 14–54)
AST: 17 U/L (ref 15–41)
Alkaline Phosphatase: 106 U/L (ref 38–126)
BILIRUBIN TOTAL: 2.2 mg/dL — AB (ref 0.3–1.2)
BUN: 51 mg/dL — AB (ref 6–20)
CHLORIDE: 97 mmol/L — AB (ref 101–111)
CO2: <7 mmol/L — ABNORMAL LOW (ref 22–32)
Calcium: 8.6 mg/dL — ABNORMAL LOW (ref 8.9–10.3)
Creatinine, Ser: 3.36 mg/dL — ABNORMAL HIGH (ref 0.44–1.00)
GFR calc Af Amer: 17 mL/min — ABNORMAL LOW (ref >60)
GFR calc non Af Amer: 15 mL/min — ABNORMAL LOW (ref >60)
GLUCOSE: 842 mg/dL — AB (ref 65–99)
POTASSIUM: 6.5 mmol/L — AB (ref 3.5–5.1)
Sodium: 128 mmol/L — ABNORMAL LOW (ref 135–145)
TOTAL PROTEIN: 5.8 g/dL — AB (ref 6.5–8.1)

## 2017-08-31 LAB — C DIFFICILE QUICK SCREEN W PCR REFLEX
C DIFFICLE (CDIFF) ANTIGEN: NEGATIVE
C Diff interpretation: NOT DETECTED
C Diff toxin: NEGATIVE

## 2017-08-31 LAB — URINALYSIS, ROUTINE W REFLEX MICROSCOPIC
Bilirubin Urine: NEGATIVE
Glucose, UA: >=500 mg/dL — AB
Hgb urine dipstick: NEGATIVE
KETONES UR: 20 mg/dL — AB
Leukocytes, UA: NEGATIVE
Nitrite: NEGATIVE
PROTEIN: 100 mg/dL — AB
Specific Gravity, Urine: 1.017 (ref 1.005–1.030)
Squamous Epithelial / LPF: NONE SEEN
pH: 5 (ref 5.0–8.0)

## 2017-08-31 LAB — CBC WITH DIFFERENTIAL/PLATELET
BASOS ABS: 0 10*3/uL (ref 0.0–0.1)
BASOS PCT: 0 %
Eosinophils Absolute: 0 10*3/uL (ref 0.0–0.7)
Eosinophils Relative: 0 %
HCT: 32.2 % — ABNORMAL LOW (ref 36.0–46.0)
HEMOGLOBIN: 9 g/dL — AB (ref 12.0–15.0)
Lymphocytes Relative: 5 %
Lymphs Abs: 0.6 10*3/uL — ABNORMAL LOW (ref 0.7–4.0)
MCH: 28 pg (ref 26.0–34.0)
MCHC: 28 g/dL — ABNORMAL LOW (ref 30.0–36.0)
MCV: 100.3 fL — ABNORMAL HIGH (ref 78.0–100.0)
MONO ABS: 0.7 10*3/uL (ref 0.1–1.0)
Monocytes Relative: 6 %
NEUTROS ABS: 11.5 10*3/uL — AB (ref 1.7–7.7)
NEUTROS PCT: 89 %
Platelets: 220 10*3/uL (ref 150–400)
RBC: 3.21 MIL/uL — AB (ref 3.87–5.11)
RDW: 14.1 % (ref 11.5–15.5)
WBC: 12.8 10*3/uL — AB (ref 4.0–10.5)

## 2017-08-31 LAB — BASIC METABOLIC PANEL
BUN: 50 mg/dL — ABNORMAL HIGH (ref 6–20)
CHLORIDE: 104 mmol/L (ref 101–111)
CO2: <7 mmol/L — ABNORMAL LOW (ref 22–32)
Calcium: 7.8 mg/dL — ABNORMAL LOW (ref 8.9–10.3)
Creatinine, Ser: 3.23 mg/dL — ABNORMAL HIGH (ref 0.44–1.00)
GFR, EST AFRICAN AMERICAN: 18 mL/min — AB (ref >60)
GFR, EST NON AFRICAN AMERICAN: 16 mL/min — AB (ref >60)
GLUCOSE: 612 mg/dL — AB (ref 65–99)
POTASSIUM: 4.6 mmol/L (ref 3.5–5.1)
Sodium: 133 mmol/L — ABNORMAL LOW (ref 135–145)

## 2017-08-31 LAB — I-STAT CG4 LACTIC ACID, ED
LACTIC ACID, VENOUS: 2.19 mmol/L — AB (ref 0.5–1.9)
LACTIC ACID, VENOUS: 2.03 mmol/L — AB (ref 0.5–1.9)

## 2017-08-31 LAB — CBG MONITORING, ED
Glucose-Capillary: >600 mg/dL (ref 65–99)
Glucose-Capillary: 528 mg/dL (ref 65–99)

## 2017-08-31 LAB — I-STAT CHEM 8, ED
BUN: 43 mg/dL — AB (ref 6–20)
CHLORIDE: 101 mmol/L (ref 101–111)
Calcium, Ion: 1.23 mmol/L (ref 1.15–1.40)
Creatinine, Ser: 2.5 mg/dL — ABNORMAL HIGH (ref 0.44–1.00)
Glucose, Bld: >700 mg/dL (ref 65–99)
HEMATOCRIT: 30 % — AB (ref 36.0–46.0)
Hemoglobin: 10.2 g/dL — ABNORMAL LOW (ref 12.0–15.0)
POTASSIUM: 6.5 mmol/L — AB (ref 3.5–5.1)
SODIUM: 128 mmol/L — AB (ref 135–145)
TCO2: 7 mmol/L — ABNORMAL LOW (ref 22–32)

## 2017-08-31 LAB — GLUCOSE, CAPILLARY
GLUCOSE-CAPILLARY: 453 mg/dL — AB (ref 65–99)
GLUCOSE-CAPILLARY: 368 mg/dL — AB (ref 65–99)

## 2017-08-31 LAB — BRAIN NATRIURETIC PEPTIDE: B NATRIURETIC PEPTIDE 5: 1022.8 pg/mL — AB (ref 0.0–100.0)

## 2017-08-31 MED ORDER — SODIUM CHLORIDE 0.9 % IV SOLN: Freq: Once | INTRAVENOUS | Status: DC

## 2017-08-31 MED ORDER — SODIUM CHLORIDE 0.9 % IV SOLN
INTRAVENOUS | Status: DC
  Administered 2017-08-31: 22:00:00 via INTRAVENOUS

## 2017-08-31 MED ORDER — SODIUM CHLORIDE 0.9 % IV BOLUS (SEPSIS)
500.0000 mL | Freq: Once | INTRAVENOUS | Status: AC
  Administered 2017-08-31: 500 mL via INTRAVENOUS

## 2017-08-31 MED ORDER — SODIUM CHLORIDE 0.9 % IV SOLN
INTRAVENOUS | Status: DC
  Administered 2017-08-31: 5.4 [IU]/h via INTRAVENOUS
  Filled 2017-08-31: qty 1

## 2017-08-31 MED ORDER — POTASSIUM CHLORIDE 10 MEQ/100ML IV SOLN: 10.0000 meq | INTRAVENOUS | Status: AC

## 2017-08-31 MED ORDER — ALBUTEROL (5 MG/ML) CONTINUOUS INHALATION SOLN
10.0000 mg/h | INHALATION_SOLUTION | Freq: Once | RESPIRATORY_TRACT | Status: AC
  Administered 2017-08-31: 10 mg/h via RESPIRATORY_TRACT

## 2017-08-31 MED ORDER — ORAL CARE MOUTH RINSE
15.0000 mL | Freq: Two times a day (BID) | OROMUCOSAL | Status: DC
  Administered 2017-08-31: 15 mL via OROMUCOSAL

## 2017-08-31 MED ORDER — SODIUM CHLORIDE 0.9 % IV BOLUS (SEPSIS)
1000.0000 mL | Freq: Once | INTRAVENOUS | Status: AC
  Administered 2017-08-31: 1000 mL via INTRAVENOUS

## 2017-08-31 MED ORDER — STERILE WATER FOR INJECTION IV SOLN
INTRAVENOUS | Status: DC
  Administered 2017-09-01: 04:00:00 via INTRAVENOUS
  Filled 2017-08-31 (×3): qty 850

## 2017-08-31 MED ORDER — SODIUM CHLORIDE 0.9 % IV SOLN
1.0000 g | Freq: Once | INTRAVENOUS | Status: AC
  Administered 2017-08-31: 1 g via INTRAVENOUS
  Filled 2017-08-31: qty 10

## 2017-08-31 MED ORDER — ENOXAPARIN SODIUM 30 MG/0.3ML ~~LOC~~ SOLN
30.0000 mg | SUBCUTANEOUS | Status: DC
  Administered 2017-08-31 – 2017-09-01 (×2): 30 mg via SUBCUTANEOUS
  Filled 2017-08-31 (×3): qty 0.3

## 2017-08-31 MED ORDER — SODIUM CHLORIDE 0.9 % IV SOLN: INTRAVENOUS | Status: AC

## 2017-08-31 MED ORDER — HYDROCORTISONE NA SUCCINATE PF 100 MG IJ SOLR
100.0000 mg | Freq: Once | INTRAMUSCULAR | Status: AC
  Administered 2017-08-31: 100 mg via INTRAVENOUS
  Filled 2017-08-31: qty 2

## 2017-08-31 MED ORDER — SODIUM CHLORIDE 0.9 % IV SOLN
INTRAVENOUS | Status: DC
  Administered 2017-08-31: 12.3 [IU]/h via INTRAVENOUS
  Filled 2017-08-31: qty 1

## 2017-08-31 MED ORDER — VANCOMYCIN HCL 10 G IV SOLR
2000.0000 mg | Freq: Once | INTRAVENOUS | Status: AC
  Administered 2017-08-31: 2000 mg via INTRAVENOUS
  Filled 2017-08-31: qty 2000

## 2017-08-31 MED ORDER — SODIUM BICARBONATE 8.4 % IV SOLN
50.0000 meq | Freq: Once | INTRAVENOUS | Status: AC
  Administered 2017-08-31: 50 meq via INTRAVENOUS
  Filled 2017-08-31: qty 50

## 2017-08-31 MED ORDER — STERILE WATER FOR INJECTION IV SOLN
INTRAVENOUS | Status: DC
  Administered 2017-08-31: 20:00:00 via INTRAVENOUS
  Filled 2017-08-31 (×2): qty 850

## 2017-08-31 MED ORDER — SODIUM CHLORIDE 0.9 % IV SOLN
INTRAVENOUS | Status: DC
  Administered 2017-08-31 – 2017-09-01 (×2): via INTRAVENOUS

## 2017-08-31 MED ORDER — PIPERACILLIN-TAZOBACTAM 3.375 G IVPB
3.3750 g | Freq: Three times a day (TID) | INTRAVENOUS | Status: DC
  Administered 2017-08-31 – 2017-09-01 (×2): 3.375 g via INTRAVENOUS
  Filled 2017-08-31 (×3): qty 50

## 2017-08-31 MED ORDER — VANCOMYCIN HCL IN DEXTROSE 1-5 GM/200ML-% IV SOLN
1000.0000 mg | INTRAVENOUS | Status: DC
  Administered 2017-09-01: 1000 mg via INTRAVENOUS
  Filled 2017-08-31: qty 200

## 2017-08-31 MED ORDER — HYDROCORTISONE NA SUCCINATE PF 100 MG IJ SOLR
50.0000 mg | Freq: Four times a day (QID) | INTRAMUSCULAR | Status: DC
  Administered 2017-08-31 – 2017-09-01 (×2): 50 mg via INTRAVENOUS
  Filled 2017-08-31 (×2): qty 2
  Filled 2017-08-31: qty 1

## 2017-08-31 MED ORDER — PIPERACILLIN-TAZOBACTAM 3.375 G IVPB 30 MIN
3.3750 g | Freq: Once | INTRAVENOUS | Status: AC
Start: 2017-08-31 — End: 2017-08-31
  Administered 2017-08-31: 3.375 g via INTRAVENOUS
  Filled 2017-08-31: qty 50

## 2017-08-31 MED ORDER — DEXTROSE-NACL 5-0.45 % IV SOLN
INTRAVENOUS | Status: DC
  Administered 2017-09-01: 02:00:00 via INTRAVENOUS

## 2017-08-31 NOTE — Progress Notes (Signed)
ABG completed. Results handed off too DR. Isaacs. Results will not cross over due too CO2 below reportable range. ABG: PH 7.138, CO2 <15, PAO2 88.

## 2017-08-31 NOTE — ED Notes (Signed)
Hospitalist bedside 

## 2017-08-31 NOTE — ED Notes (Signed)
Pt CBG high (>600). Notified Thayer Ohmhris Banker(RN)

## 2017-08-31 NOTE — H&P (Signed)
PULMONARY / CRITICAL CARE MEDICINE   Name: [name] MRN: [id number] DOB: [birth year]    ADMISSION DATE:  08/31/2017 CONSULTATION DATE: August 31, 2017 REFERRING MD: ED staff CHIEF COMPLAINT: Shortness of breath fatigue hyperglycemia DKA  HISTORY OF PRESENT ILLNESS:   Patient is 51 years old Caucasian female who looks much older than her stated age with a very complex past medical history of kidney transplant on steroids hypertension diabetes hyperlipidemia recent discharge from rehab after she had right hip fracture and ORIF she went home a week ago she has been feeling sick with cough shortness of breath fatigue and severe hyperglycemia patient is denying fever but she had chills and rigors she is feeling very short of breath now and she is saying she took her medication for diabetes but she is in poor control and severe DKA.  She also has very severe polyuria polydipsia very dry mouth.  Patient is seeing when she left rehab a week ago she has shortness of breath and minimal cough.  PAST MEDICAL HISTORY :  She  has a past medical history of Arthritis, CKD (chronic kidney disease), Daily headache, Depression, End stage renal disease (HCC), Hypertension, Hypothyroid, Immunosuppression (HCC), Kidney disease (2011), Pneumonia (~ 2007?), and Type II diabetes mellitus (HCC).  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  Allergies  Allergen Reactions  . Latex Itching  . Ace Inhibitors Cough  . Adhesive [Tape] Itching and  Other (See Comments)    Please use paper tape  . Lisinopril Cough    No current facility-administered medications on file prior to encounter.    Current Outpatient Medications on File Prior to Encounter  Medication Sig  . acetaminophen (TYLENOL) 325 MG tablet Take 2 tablets (650 mg total) by mouth every 6 (six) hours as needed for mild pain, moderate pain, fever or headache.  [name]. amLODipine (NORVASC) 10 MG tablet Take 1 tablet (10 mg total) by mouth daily. (Patient taking differently: Take 10 mg by mouth at bedtime. )  . aspirin EC 81 MG tablet Take 81 mg by mouth daily as needed (for "heart health").   . carvedilol (COREG) 25 MG tablet Take 50 mg by mouth 2 (two) times daily.  [name]. HYDROcodone-acetaminophen (NORCO) 5-325 MG tablet Take 0.5-1 tablets by mouth every 4 (four) hours as needed for moderate pain or severe pain.  . Insulin Isophane & Regular Human (NOVOLIN 70/30 FLEXPEN RELION) (70-30) 100 UNIT/ML PEN Inject 20 Units into the skin 2 (two) times daily before a meal. PER SLIDING SCALE  . mycophenolate (CELLCEPT) 250 MG capsule Take 500 mg by mouth 2 (two) times daily.   . predniSONE (DELTASONE) 5 MG tablet Take 5 mg by mouth daily.   [name]. PROGRAF 1 MG capsule Take 1 mg by mouth every morning.   . Pseudoeph-Doxylamine-DM-APAP (TYLENOL COLD/FLU SEVERE NIGHT PO) Take 10-15 mLs by mouth every 6 (six) hours as needed (for symptoms).  . SYNTHROID 137 MCG tablet Take 137 mcg by mouth daily.  . tacrolimus (PROGRAF) 0.5 MG capsule Take 0.5 mg by mouth every evening.   . Continuous Blood Gluc Receiver (FREESTYLE LIBRE READER) DEVI 1 each  by Does not apply route 4 (four) times daily.  . Continuous Blood Gluc Sensor (FREESTYLE LIBRE SENSOR SYSTEM) MISC 1 sensor every 10 days  . doxycycline (VIBRA-TABS) 100 MG tablet Take 1 tablet (100 mg total) by mouth every 12 (twelve) hours. (Patient not taking: Reported on 08/31/2017)  . enoxaparin (LOVENOX) 40 MG/0.4ML injection Inject 0.4 mLs (40 mg total) into  the skin daily. 30 days post op (Patient not taking: Reported on 08/31/2017)  . hydrALAZINE (APRESOLINE) 25 MG tablet Take 1 tablet (25 mg total) by mouth every 12 (twelve) hours. (Patient not taking: Reported on 08/31/2017)  . insulin NPH-regular Human (NOVOLIN 70/30) (70-30) 100 UNIT/ML injection Inject 20 Units into the skin 2 (two) times daily with a meal. (Patient not taking: Reported on 08/31/2017)  . vitamin B-12 (CYANOCOBALAMIN) 100 MCG tablet Take 1 tablet (100 mcg total) by mouth daily.    FAMILY HISTORY:  Her indicated that her mother is deceased. She indicated that her father is deceased. She indicated that the status of her son is unknown.   SOCIAL HISTORY: She  reports that she quit smoking about 18 years ago. Her smoking use included cigarettes. She has a 11.00 pack-year smoking history. she has never used smokeless tobacco. She reports that she does not drink alcohol or use drugs.  REVIEW OF SYSTEMS:   14 points reviewed and was mentioned in the HPI  SUBJECTIVE:  Severe weakness hypertension shortness of breath dry mouth generalized weakness  VITAL SIGNS: BP (!) 103/58   Pulse 82   Temp (!) 96.3 F (35.7 C) (Rectal)   Resp (!) 26   Ht 5\' 8"  (1.727 m)   Wt 180 lb (81.6 kg)   LMP 11/19/2011   SpO2 98%   BMI 27.37 kg/m   HEMODYNAMICS:    VENTILATOR SETTINGS:    INTAKE / OUTPUT: I/O last 3 completed shifts: In: 1550 [IV Piggyback:1550] Out: -   PHYSICAL EXAMINATION: General: In severe shortness of breath able to maintain her airways very dry mucous membranes decreased air entry with expiratory crackles mainly on the left no wheezing Neuro:  WNL , AOX3 , EOMI , CN II-XII intact , UL , LL strength is symmetrical and 5/5 HEENT:  atraumatic , no jaundice , dry mucous membranes  Cardiovascular:  Irregular irregular , ESM 2/6 in the aortic area  Lungs: Decreased air entry with inspiratory crackles mainly on the left no wheezing Abdomen:  Soft lax +BS , no  tenderness . Musculoskeletal:  WNL , normal pulses  Skin:  No rash    LABS:  BMET Recent Labs  Lab 08/31/17 1553 08/31/17 1610  NA 128* 128*  K 6.5* 6.5*  CL 97* 101  CO2 <7*  --   BUN 51* 43*  CREATININE 3.36* 2.50*  GLUCOSE 842* >700*    Electrolytes Recent Labs  Lab 08/31/17 1553  CALCIUM 8.6*    CBC Recent Labs  Lab 08/31/17 1553 08/31/17 1610  WBC 12.8*  --   HGB 9.0* 10.2*  HCT 32.2* 30.0*  PLT 220  --     Coag's No results for input(s): APTT, INR in the last 168 hours.  Sepsis Markers Recent Labs  Lab 08/31/17 1611 08/31/17 1825  LATICACIDVEN 2.19* 2.03*    ABG No results for input(s): PHART, PCO2ART, PO2ART in the last 168 hours.  Liver Enzymes Recent Labs  Lab 08/31/17 1553  AST 17  ALT 14  ALKPHOS 106  BILITOT 2.2*  ALBUMIN 2.7*    Cardiac Enzymes  No results for input(s): TROPONINI, PROBNP in the last 168 hours.  Glucose Recent Labs  Lab 08/31/17 1643 08/31/17 1800 08/31/17 1913  GLUCAP >600* >600* >600*    Imaging Dg Chest Portable 1 View  Result Date: 08/31/2017 CLINICAL DATA:  Cough for 2 days EXAM: PORTABLE CHEST 1 VIEW COMPARISON:  None. FINDINGS: There is left upper lobe and left lower lobe airspace disease most concerning for pneumonia. There is no pleural effusion or pneumothorax. The heart and mediastinal contours are unremarkable. The osseous structures are unremarkable. IMPRESSION: Left upper lobe and left lower lobe pneumonia. Followup PA and lateral chest X-ray is recommended in 3-4 weeks following trial of antibiotic therapy to ensure resolution and exclude underlying malignancy. Electronically Signed   By: Elige KoHetal  Patel   On: 08/31/2017 16:47        DISCUSSION: 51 years old with history of kidney transplant diabetes who presents with sepsis pneumonia acute respiratory failure acute kidney injury hyperkalemia hyponatremia severe acidosis generalized weakness multisystem organ failure  hypertension  ASSESSMENT / PLAN:  PULMONARY A: Severe H Kabbe this point continue with antibiotic patient is immunocompromised we will start with broad-spectrum antibiotic and was started on steroids in the form of hydrocortisone stress dose given her long-term use because of kidney transplant. P:     CARDIOVASCULAR Severe sepsis hypertension hold all antihypertensive medications  RENAL Acute on top of renal insufficiency stage III status post transplant patient saying that she had the transplant in 2001 she is on steroid later on we need to decrease her medic reconciled her medications and start her on her all of her immunosuppressants.  Avoid nephrotoxic agents at this point her acute kidney injury is most likely dilated osmotic diuresis severe hyper glycemia Hyponatremia is pseudohyponatremia Hyperkalemia is due to acidosis acute kidney injury we will repeat labs once patient acidosis improved I suspect this will start improving with treatment here acidosis is already improved on the repeat gas  GASTROINTESTINAL Keep the patient n.p.o. for now  HEMATOLOGIC Severe sepsis severe sepsis from  INFECTIOUS H Continue with broad-spectrum antibiotic.  ENDOCRINE DKA precipitated by sepsis continue DKA protocol can switch to long-acting after that on the gap close  NEUROLOGIC Patient is altered because of multisystem organ failure   Critical care time 60 minutes.    Pulmonary and Critical Care Medicine [facility] Pager: [phone]  08/31/2017, 8:05 PM

## 2017-08-31 NOTE — Progress Notes (Signed)
ABG obtained and ran on iSTAT. Results were outside of reportable range so results did not cross over. PH 7.07 pCO2 >15 pO2 105. Results RBV by Dr Erma HeritageIsaacs.

## 2017-08-31 NOTE — ED Notes (Signed)
Glucose 612 per main lab

## 2017-08-31 NOTE — Progress Notes (Signed)
Pharmacy Antibiotic Note  [name] is a 51 y.o. female admitted on 08/31/2017 with sepsis.  Pharmacy has been consulted for vancomycin and zosyn dosing.  Patient recently discharged from rehab facility for hip fracture in September. She presents with cough, hyperglycemia, SOB and fatigue. Appears to have AKI with Scr of 2.5 (baseline ~1.1).   Plan: Vancomycin 2000 mg IV x 1, then 1000 mg IV q 24 hrs. Goal trough 15-20  Zosyn 3.375 gram IV q 8 hours (4 hour infusion) Follow renal function, clinical status, LOT/de-escalation plans Obtain vancomycin trough level as indicated  Height: 5\' 8"  (172.7 cm) Weight: 180 lb (81.6 kg) IBW/kg (Calculated) : 63.9  Temp (24hrs), Avg:94.9 F (34.9 C), Min:94.9 F (34.9 C), Max:94.9 F (34.9 C)  No results for input(s): WBC, CREATININE, LATICACIDVEN, VANCOTROUGH, VANCOPEAK, VANCORANDOM, GENTTROUGH, GENTPEAK, GENTRANDOM, TOBRATROUGH, TOBRAPEAK, TOBRARND, AMIKACINPEAK, AMIKACINTROU, AMIKACIN in the last 168 hours.  CrCl cannot be calculated (Patient's most recent lab result is older than the maximum 21 days allowed.).    Allergies  Allergen Reactions  . Ace Inhibitors Cough  . Adhesive [Tape] Itching and Other (See Comments)    Please use paper tape  . Lisinopril Cough    Antimicrobials this admission: Vanc 12/25>> Zosyn 12/25>>   Microbiology results: 12/25 BCx: pending   Thank you for allowing pharmacy to be a part of this patient's care.  Al CorpusLindsey Chaos Carlile, PharmD PGY1 Pharmacy Resident [facility] [phone]#[id number] 08/31/2017 4:11 PM

## 2017-08-31 NOTE — ED Provider Notes (Signed)
[facility] EMERGENCY DEPARTMENT Provider Note   CSN: [id number] Arrival date & time: 08/31/17  1517     History   Chief Complaint Chief Complaint  Patient presents with  . Shortness of Breath    HPI [name] is a 51 y.o. female.  HPI   51 year old female with past medical history as below who presents with shortness of breath and fatigue.  History provided mostly by the patient's husband.  The patient reportedly has recently just been discharged from rehab following short stay for hip fracture.  She was discharged at the end of last week.  She was well for 1-2 days.  However, she did have a mild cough when she left the nursing home that persisted.  Over the last 2 days, her cough is worsened and she is developed worsening hyperglycemia, shortness of breath, and general fatigue.  The patient has also been increasingly drowsy and confused.  On the way to the hospital today, she had a brief episode when she was sitting up in the car where she reportedly started moaning and essentially lost consciousness.  There is no generalized seizure activity.  No loss of bowel or bladder function.  She then regained consciousness.  She was moaning the entire time.  She currently states she feels extremely short of breath and has a dry mouth.  Denies any pain.  Denies any abdominal pain.  Denies any dysuria.  Past Medical History:  Diagnosis Date  . Arthritis    "elbows, knees, legs, back" (09/24/2015)  . CKD (chronic kidney disease)   . Daily headache   . Depression    "years ago"  . End stage renal disease (HCC)    right arm AV graft, resolved post transplant  . Hypertension   . Hypothyroid   . 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 Kidney disease 2011  . Pneumonia ~ 2007?  [name] Type II diabetes mellitus (HCC)    Insulin dependant    Patient Active Problem List   Diagnosis Date Noted  . Status post kidney transplant 06/02/2017  . Closed  displaced fracture of right femoral neck (HCC) 06/02/2017  . Femoral neck fracture (HCC) 06/02/2017  . Hyperglycemia 05/09/2017  . Lower extremity cellulitis 03/23/2017  . Type II diabetes mellitus (HCC) 03/23/2017  . Anemia 01/26/2017  . Abnormal LFTs 01/26/2017  . DKA (diabetic ketoacidoses) (HCC) 08/12/2016  . Hypoglycemia   . Wound cellulitis   . CKD (chronic kidney disease) stage 3, GFR 30-59 ml/min (HCC) 06/26/2016  . Cellulitis of right lower extremity   . QT prolongation 05/13/2016  . UTI (lower urinary tract infection)   . Hypomagnesemia   . Pressure ulcer 04/11/2016  . Acute encephalopathy 04/11/2016  . Altered mental status 04/10/2016  . Insulin dependent diabetes mellitus (HCC) 04/10/2016  . Diabetic ketoacidosis without coma associated with type 2 diabetes mellitus (HCC) 11/02/2015  . Uncontrolled type 1 diabetes mellitus with foot ulcer (HCC) 06/04/2015  . Foot ulcer, right (HCC)   . Diarrhea 06/02/2015  . Type 1 diabetes mellitus with diabetic foot ulcer (HCC) 06/02/2015  . Acute renal failure superimposed on stage 3 chronic kidney disease (HCC) 06/02/2015  . Diabetic foot infection (HCC) 06/01/2015  . Essential hypertension 06/01/2015  . GERD (gastroesophageal reflux disease) 06/01/2015  . Hypotension 03/22/2014  . H/O kidney transplant 08/04/2012  . Sepsis (HCC) 08/04/2012  . Hyperkalemia 09/22/2011  . Hyponatremia 09/22/2011  . Immunosuppression (HCC)   . Hypothyroidism  Past Surgical History:  Procedure Laterality Date  . AMPUTATION Left 05/11/2014   Procedure: AMPUTATION LEFT GREAT TOE;  Surgeon: Kathryne Hitch, MD;  Location: [facility];  Service: Orthopedics;  Laterality: Left;  . AV FISTULA PLACEMENT Right    forearm  . BACK SURGERY    . CATARACT EXTRACTION W/ INTRAOCULAR LENS  IMPLANT, BILATERAL Bilateral   . CESAREAN SECTION  07/1999  . DG AV DIALYSIS GRAFT DECLOT OR    . HIP PINNING,CANNULATED Right 06/02/2017   Procedure: CANNULATED RIGHT  HIP PINNING;  Surgeon: Beverely Low, MD;  Location: [facility];  Service: Orthopedics;  Laterality: Right;  . INCISION AND DRAINAGE Right 06/02/2015   Procedure: INCISION AND DRAINAGE OF RIGHT 3rd RAY RESECTION;  Surgeon: Kathryne Hitch, MD;  Location: [facility];  Service: Orthopedics;  Laterality: Right;  . 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 LUMBAR DISC SURGERY  2001  . NEPHRECTOMY TRANSPLANTED ORGAN    . TOE AMPUTATION Right    1,2 & 3rd toes.  . TUBAL LIGATION  07/1999    OB History    Gravida Para Term Preterm AB Living             1   SAB TAB Ectopic Multiple Live Births                   Home Medications    Prior to Admission medications   Medication Sig Start Date End Date Taking? Authorizing Provider  acetaminophen (TYLENOL) 325 MG tablet Take 2 tablets (650 mg total) by mouth every 6 (six) hours as needed for mild pain, moderate pain, fever or headache. 05/15/16   Hongalgi, Maximino Greenland, MD  amLODipine (NORVASC) 10 MG tablet Take 1 tablet (10 mg total) by mouth daily. 08/14/16   Regalado, Jon Billings A, MD  aspirin EC 81 MG tablet Take 81 mg by mouth daily as needed for moderate pain.     [provider]  carvedilol (COREG) 25 MG tablet Take 50 mg by mouth 2 (two) times daily. 02/26/17   [provider]  Continuous Blood Gluc Receiver (FREESTYLE LIBRE READER) DEVI 1 each by Does not apply route 4 (four) times daily. 04/29/17   Leatha Gilding, MD  Continuous Blood Gluc Sensor (FREESTYLE LIBRE SENSOR SYSTEM) MISC 1 sensor every 10 days 04/29/17   Leatha Gilding, MD  doxycycline (VIBRA-TABS) 100 MG tablet Take 1 tablet (100 mg total) by mouth every 12 (twelve) hours. 04/29/17   Leatha Gilding, MD  enoxaparin (LOVENOX) 40 MG/0.4ML injection Inject 0.4 mLs (40 mg total) into the skin daily. 30 days post op 06/02/17   Beverely Low, MD  hydrALAZINE (APRESOLINE) 25 MG tablet Take 1 tablet (25 mg total) by mouth every 12 (twelve) hours. 04/29/17 05/29/17  Leatha Gilding, MD  HYDROcodone-acetaminophen (NORCO) 5-325 MG tablet Take 0.5-1 tablets by mouth every 4 (four) hours as needed for moderate pain or severe pain. 06/02/17   Beverely Low, MD  insulin NPH-regular Human (NOVOLIN 70/30) (70-30) 100 UNIT/ML injection Inject 20 Units into the skin 2 (two) times daily with a meal. 04/14/17   Amin, Loura Halt, MD  mycophenolate (CELLCEPT) 250 MG capsule Take 500 mg by mouth 2 (two) times daily.     [provider]  predniSONE (DELTASONE) 5 MG tablet Take 5 mg by mouth at bedtime.  05/29/15   [provider]  PROGRAF 1 MG capsule Take 1 mg by mouth every evening.  04/12/17  [provider]  SYNTHROID 137 MCG tablet Take 137 mcg by mouth daily. 04/10/17   [provider]  tacrolimus (PROGRAF) 0.5 MG capsule Take 0.5 mg by mouth every morning.     [provider]  vitamin B-12 (CYANOCOBALAMIN) 100 MCG tablet Take 1 tablet (100 mcg total) by mouth daily. 03/29/17 03/29/18  Regalado, Prentiss Bells, MD    Family History Family History  Problem Relation Age of Onset  . Emphysema Mother   . Throat cancer Mother   . COPD Mother   . Cancer Mother   . Emphysema Father   . COPD Father   . Stroke Father   . ADD / ADHD Son     Social History Social History   Tobacco Use  . Smoking status: Former Smoker    Packs/day: 1.00    Years: 11.00    Pack years: 11.00    Types: Cigarettes    Last attempt to quit: 09/21/1998    Years since quitting: 18.9  . Smokeless tobacco: Never Used  Substance Use Topics  . Alcohol use: No  . Drug use: No     Allergies   Ace inhibitors; Adhesive [tape]; and Lisinopril   Review of Systems Review of Systems  Constitutional: Positive for fatigue.  Respiratory: Positive for cough and shortness of breath.   Gastrointestinal: Positive for nausea.  Neurological: Positive for weakness.  Psychiatric/Behavioral: Positive for confusion.  All other systems reviewed and are  negative.    Physical Exam Updated Vital Signs BP 99/65   Pulse 70   Temp (!) 94.9 F (34.9 C) (Rectal)   Resp (!) 26   Ht 5\' 8"  (1.727 m)   Wt 81.6 kg (180 lb)   LMP 11/19/2011   SpO2 99%   BMI 27.37 kg/m   Physical Exam  Constitutional: She is oriented to person, place, and time. She appears well-developed and well-nourished. She appears ill. She appears distressed.  HENT:  Head: Normocephalic and atraumatic.  Markedly dry mucous membranes  Eyes: Conjunctivae are normal.  Neck: Neck supple.  Cardiovascular: Normal rate, regular rhythm and normal heart sounds. Exam reveals no friction rub.  No murmur heard. Pulmonary/Chest: Effort normal. No respiratory distress. She has no wheezes. She has rales in the left lower field.  Abdominal: She exhibits no distension.  Musculoskeletal: She exhibits no edema.  Neurological: She is alert and oriented to person, place, and time. She exhibits normal muscle tone.  Skin: Skin is warm. Capillary refill takes less than 2 seconds.  Psychiatric: She has a normal mood and affect.  Nursing note and vitals reviewed.    ED Treatments / Results  Labs (all labs ordered are listed, but only abnormal results are displayed) Labs Reviewed  CBC WITH DIFFERENTIAL/PLATELET - Abnormal; Notable for the following components:      Result Value   WBC 12.8 (*)    RBC 3.21 (*)    Hemoglobin 9.0 (*)    HCT 32.2 (*)    MCV 100.3 (*)    MCHC 28.0 (*)    Neutro Abs 11.5 (*)    Lymphs Abs 0.6 (*)    All other components within normal limits  COMPREHENSIVE METABOLIC PANEL - Abnormal; Notable for the following components:   Sodium 128 (*)    Potassium 6.5 (*)    Chloride 97 (*)    CO2 <7 (*)    Glucose, Bld 842 (*)    BUN 51 (*)    Creatinine, Ser 3.36 (*)  Calcium 8.6 (*)    Total Protein 5.8 (*)    Albumin 2.7 (*)    Total Bilirubin 2.2 (*)    GFR calc non Af Amer 15 (*)    GFR calc Af Amer 17 (*)    All other components within normal  limits  BRAIN NATRIURETIC PEPTIDE - Abnormal; Notable for the following components:   B Natriuretic Peptide 1,022.8 (*)    All other components within normal limits  I-STAT CHEM 8, ED - Abnormal; Notable for the following components:   Sodium 128 (*)    Potassium 6.5 (*)    BUN 43 (*)    Creatinine, Ser 2.50 (*)    Glucose, Bld >700 (*)    TCO2 7 (*)    Hemoglobin 10.2 (*)    HCT 30.0 (*)    All other components within normal limits  I-STAT CG4 LACTIC ACID, ED - Abnormal; Notable for the following components:   Lactic Acid, Venous 2.19 (*)    All other components within normal limits  CBG MONITORING, ED - Abnormal; Notable for the following components:   Glucose-Capillary >600 (*)    All other components within normal limits  CULTURE, BLOOD (ROUTINE X 2)  CULTURE, BLOOD (ROUTINE X 2)  TACROLIMUS LEVEL  URINALYSIS, ROUTINE W REFLEX MICROSCOPIC  I-STAT ARTERIAL BLOOD GAS, ED  I-STAT CG4 LACTIC ACID, ED    EKG  EKG Interpretation  Date/Time:  Tuesday August 31 2017 15:35:28 EST Ventricular Rate:  74 PR Interval:    QRS Duration: 158 QT Interval:  483 QTC Calculation: 536 R Axis:   166 Text Interpretation:  Sinus rhythm Right bundle branch block Since last EKG, peaked T waves are noted along with QT prolongation Confirmed by Shaune PollackIsaacs, Kyjuan Gause [phone]([id number]) on 08/31/2017 3:38:58 PM       Radiology Dg Chest Portable 1 View  Result Date: 08/31/2017 CLINICAL DATA:  Cough for 2 days EXAM: PORTABLE CHEST 1 VIEW COMPARISON:  None. FINDINGS: There is left upper lobe and left lower lobe airspace disease most concerning for pneumonia. There is no pleural effusion or pneumothorax. The heart and mediastinal contours are unremarkable. The osseous structures are unremarkable. IMPRESSION: Left upper lobe and left lower lobe pneumonia. Followup PA and lateral chest X-ray is recommended in 3-4 weeks following trial of antibiotic therapy to ensure resolution and exclude underlying malignancy.  Electronically Signed   By: Elige KoHetal  Patel   On: 08/31/2017 16:47    Procedures .Critical Care Performed by: Shaune PollackIsaacs, Sherrel Ploch, MD Authorized by: Shaune PollackIsaacs, Delorese Sellin, MD   Critical care provider statement:    Critical care time (minutes):  55   Critical care time was exclusive of:  Separately billable procedures and treating other patients and teaching time   Critical care was necessary to treat or prevent imminent or life-threatening deterioration of the following conditions:  Sepsis, circulatory failure and shock   Critical care was time spent personally by me on the following activities:  Development of treatment plan with patient or surrogate, discussions with consultants, evaluation of patient's response to treatment, examination of patient, obtaining history from patient or surrogate, ordering and performing treatments and interventions, ordering and review of laboratory studies, ordering and review of radiographic studies, pulse oximetry, re-evaluation of patient's condition and review of old charts   I assumed direction of critical care for this patient from another provider in my specialty: no     (including critical care time)  Medications Ordered in ED Medications  sodium chloride 0.9 % bolus  1,000 mL (1,000 mLs Intravenous New Bag/Given 08/31/17 1702)  sodium chloride 0.9 % bolus 500 mL (500 mLs Intravenous New Bag/Given 08/31/17 1718)  piperacillin-tazobactam (ZOSYN) IVPB 3.375 g (3.375 g Intravenous New Bag/Given 08/31/17 1712)  vancomycin (VANCOCIN) 2,000 mg in sodium chloride 0.9 % 500 mL IVPB (2,000 mg Intravenous New Bag/Given 08/31/17 1712)  insulin regular (NOVOLIN R,HUMULIN R) 100 Units in sodium chloride 0.9 % 100 mL (1 Units/mL) infusion (5.4 Units/hr Intravenous New Bag/Given 08/31/17 1700)  vancomycin (VANCOCIN) IVPB 1000 mg/200 mL premix (not administered)  piperacillin-tazobactam (ZOSYN) IVPB 3.375 g (not administered)  sodium chloride 0.9 % bolus 1,000 mL (1,000 mLs  Intravenous New Bag/Given 08/31/17 1557)  sodium bicarbonate injection 50 mEq (50 mEq Intravenous Given 08/31/17 1655)  calcium gluconate 1 g in sodium chloride 0.9 % 100 mL IVPB (1 g Intravenous New Bag/Given 08/31/17 1702)  hydrocortisone sodium succinate (SOLU-CORTEF) 100 MG injection 100 mg (100 mg Intravenous Given 08/31/17 1717)     Initial Impression / Assessment and Plan / ED Course  I have reviewed the triage vital signs and the nursing notes.  Pertinent labs & imaging results that were available during my care of the patient were reviewed by me and considered in my medical decision making (see chart for details).     51 year old female with history of renal transplant, poorly controlled type 1 diabetes, who presents with shortness of breath, fevers, and cough.  On arrival, the patient is hypothermic, hypotensive, and tachypneic.  Code sepsis initiated.  Plain film shows left lower lobe pneumonia.  Lab work shows severe diabetic ketoacidosis with pH of 7.0 and bicarb less than 7.  Anion gap unable to be calculated.  Patient given 2 L of fluid boluses, started on broad-spectrum antibiotics, and I have given her a dose of bicarbonate for her market hyperkalemia as well as calcium and started her on an insulin drip.  Given her profound AK I, DKA, and severe sepsis secondary to pneumonia, she will need to be admitted to the ICU.  Of note, the patient is on chronic steroids and given her hypotension and lactic acidosis, I do think it is reasonable to give her a single stress dose of steroids here.  While this worsen her sugar control, she is on insulin drip so we can titrate this, and I am concerned about her hemodynamics.  Final Clinical Impressions(s) / ED Diagnoses   Final diagnoses:  Diabetic ketoacidosis without coma associated with type 1 diabetes mellitus (HCC)  AKI (acute kidney injury) (HCC)  Hyperkalemia  Sepsis due to pneumonia [facility](HCC)    ED Discharge Orders    None        Shaune PollackIsaacs, Susane Bey, MD 08/31/17 1851

## 2017-08-31 NOTE — ED Triage Notes (Signed)
Per EMS Pt was discharged from a rehab facility x3 days ago due to a hip fracture from this past September.  Yesterday she started having a cough and started throwing up and today it worsened.  She developed SOB today and her husband was taking her to the hospital and in route her husband said she passed out.

## 2017-09-01 ENCOUNTER — Inpatient Hospital Stay (HOSPITAL_COMMUNITY): Payer: Medicare Other

## 2017-09-01 LAB — BASIC METABOLIC PANEL
ANION GAP: 19 — AB (ref 5–15)
Anion gap: 13 (ref 5–15)
BUN: 44 mg/dL — AB (ref 6–20)
BUN: 50 mg/dL — ABNORMAL HIGH (ref 6–20)
CHLORIDE: 106 mmol/L (ref 101–111)
CHLORIDE: 104 mmol/L (ref 101–111)
CO2: 10 mmol/L — ABNORMAL LOW (ref 22–32)
CO2: 17 mmol/L — AB (ref 22–32)
CREATININE: 2.59 mg/dL — AB (ref 0.44–1.00)
Calcium: 8 mg/dL — ABNORMAL LOW (ref 8.9–10.3)
Calcium: 8.1 mg/dL — ABNORMAL LOW (ref 8.9–10.3)
Creatinine, Ser: 3.04 mg/dL — ABNORMAL HIGH (ref 0.44–1.00)
GFR calc Af Amer: 20 mL/min — ABNORMAL LOW (ref >60)
GFR calc non Af Amer: 20 mL/min — ABNORMAL LOW (ref >60)
GFR, EST AFRICAN AMERICAN: 24 mL/min — AB (ref >60)
GFR, EST NON AFRICAN AMERICAN: 17 mL/min — AB (ref >60)
Glucose, Bld: 227 mg/dL — ABNORMAL HIGH (ref 65–99)
Glucose, Bld: 375 mg/dL — ABNORMAL HIGH (ref 65–99)
POTASSIUM: 3.6 mmol/L (ref 3.5–5.1)
POTASSIUM: 4.2 mmol/L (ref 3.5–5.1)
SODIUM: 133 mmol/L — AB (ref 135–145)
Sodium: 136 mmol/L (ref 135–145)

## 2017-09-01 LAB — BLOOD GAS, ARTERIAL
Acid-base deficit: 3 mmol/L — ABNORMAL HIGH (ref 0.0–2.0)
Bicarbonate: 20.2 mmol/L (ref 20.0–28.0)
Drawn by: 511911
O2 CONTENT: 2 L/min
O2 SAT: 92.9 %
PATIENT TEMPERATURE: 100.7
pCO2 arterial: 30.5 mmHg — ABNORMAL LOW (ref 32.0–48.0)
pH, Arterial: 7.443 (ref 7.350–7.450)
pO2, Arterial: 68.4 mmHg — ABNORMAL LOW (ref 83.0–108.0)

## 2017-09-01 LAB — BLOOD CULTURE ID PANEL (REFLEXED)
ACINETOBACTER BAUMANNII: NOT DETECTED
CANDIDA ALBICANS: NOT DETECTED
CANDIDA GLABRATA: NOT DETECTED
CANDIDA KRUSEI: NOT DETECTED
CANDIDA PARAPSILOSIS: NOT DETECTED
Candida tropicalis: NOT DETECTED
ENTEROBACTER CLOACAE COMPLEX: NOT DETECTED
ESCHERICHIA COLI: NOT DETECTED
Enterobacteriaceae species: NOT DETECTED
Enterococcus species: NOT DETECTED
Haemophilus influenzae: NOT DETECTED
KLEBSIELLA OXYTOCA: NOT DETECTED
Klebsiella pneumoniae: NOT DETECTED
LISTERIA MONOCYTOGENES: NOT DETECTED
Methicillin resistance: NOT DETECTED
Neisseria meningitidis: NOT DETECTED
PSEUDOMONAS AERUGINOSA: NOT DETECTED
Proteus species: NOT DETECTED
SERRATIA MARCESCENS: NOT DETECTED
STAPHYLOCOCCUS AUREUS BCID: NOT DETECTED
STREPTOCOCCUS AGALACTIAE: NOT DETECTED
STREPTOCOCCUS PNEUMONIAE: NOT DETECTED
STREPTOCOCCUS PYOGENES: NOT DETECTED
Staphylococcus species: DETECTED — AB
Streptococcus species: NOT DETECTED

## 2017-09-01 LAB — GLUCOSE, CAPILLARY
GLUCOSE-CAPILLARY: 106 mg/dL — AB (ref 65–99)
GLUCOSE-CAPILLARY: 109 mg/dL — AB (ref 65–99)
GLUCOSE-CAPILLARY: 125 mg/dL — AB (ref 65–99)
GLUCOSE-CAPILLARY: 150 mg/dL — AB (ref 65–99)
GLUCOSE-CAPILLARY: 151 mg/dL — AB (ref 65–99)
GLUCOSE-CAPILLARY: 174 mg/dL — AB (ref 65–99)
GLUCOSE-CAPILLARY: 199 mg/dL — AB (ref 65–99)
GLUCOSE-CAPILLARY: 204 mg/dL — AB (ref 65–99)
GLUCOSE-CAPILLARY: 262 mg/dL — AB (ref 65–99)
Glucose-Capillary: 100 mg/dL — ABNORMAL HIGH (ref 65–99)
Glucose-Capillary: 185 mg/dL — ABNORMAL HIGH (ref 65–99)
Glucose-Capillary: 227 mg/dL — ABNORMAL HIGH (ref 65–99)
Glucose-Capillary: 316 mg/dL — ABNORMAL HIGH (ref 65–99)
Glucose-Capillary: 88 mg/dL (ref 65–99)

## 2017-09-01 LAB — CBC WITH DIFFERENTIAL/PLATELET
BASOS ABS: 0 10*3/uL (ref 0.0–0.1)
Basophils Relative: 0 %
Eosinophils Absolute: 0 10*3/uL (ref 0.0–0.7)
Eosinophils Relative: 0 %
HEMATOCRIT: 27.5 % — AB (ref 36.0–46.0)
HEMOGLOBIN: 8.8 g/dL — AB (ref 12.0–15.0)
LYMPHS PCT: 4 %
Lymphs Abs: 0.3 10*3/uL — ABNORMAL LOW (ref 0.7–4.0)
MCH: 27.9 pg (ref 26.0–34.0)
MCHC: 32 g/dL (ref 30.0–36.0)
MCV: 87.3 fL (ref 78.0–100.0)
Monocytes Absolute: 0.5 10*3/uL (ref 0.1–1.0)
Monocytes Relative: 8 %
NEUTROS ABS: 5.7 10*3/uL (ref 1.7–7.7)
Neutrophils Relative %: 88 %
PLATELETS: 157 10*3/uL (ref 150–400)
RBC: 3.15 MIL/uL — AB (ref 3.87–5.11)
RDW: 13.4 % (ref 11.5–15.5)
WBC: 6.5 10*3/uL (ref 4.0–10.5)

## 2017-09-01 LAB — MRSA PCR SCREENING: MRSA by PCR: POSITIVE — AB

## 2017-09-01 LAB — IRON AND TIBC
IRON: 12 ug/dL — AB (ref 28–170)
Saturation Ratios: 6 % — ABNORMAL LOW (ref 10.4–31.8)
TIBC: 217 ug/dL — ABNORMAL LOW (ref 250–450)
UIBC: 205 ug/dL

## 2017-09-01 LAB — PROTIME-INR
INR: 1.16
Prothrombin Time: 14.7 seconds (ref 11.4–15.2)

## 2017-09-01 LAB — COMPREHENSIVE METABOLIC PANEL
ALK PHOS: 85 U/L (ref 38–126)
ALT: 17 U/L (ref 14–54)
AST: 27 U/L (ref 15–41)
Albumin: 2.2 g/dL — ABNORMAL LOW (ref 3.5–5.0)
Anion gap: 11 (ref 5–15)
BILIRUBIN TOTAL: 0.8 mg/dL (ref 0.3–1.2)
BUN: 50 mg/dL — AB (ref 6–20)
CO2: 18 mmol/L — ABNORMAL LOW (ref 22–32)
CREATININE: 2.83 mg/dL — AB (ref 0.44–1.00)
Calcium: 7.7 mg/dL — ABNORMAL LOW (ref 8.9–10.3)
Chloride: 106 mmol/L (ref 101–111)
GFR calc Af Amer: 21 mL/min — ABNORMAL LOW (ref >60)
GFR, EST NON AFRICAN AMERICAN: 18 mL/min — AB (ref >60)
Glucose, Bld: 226 mg/dL — ABNORMAL HIGH (ref 65–99)
Potassium: 3.6 mmol/L (ref 3.5–5.1)
Sodium: 135 mmol/L (ref 135–145)
Total Protein: 5.1 g/dL — ABNORMAL LOW (ref 6.5–8.1)

## 2017-09-01 LAB — MAGNESIUM: MAGNESIUM: 1.2 mg/dL — AB (ref 1.7–2.4)

## 2017-09-01 LAB — APTT: aPTT: 36 seconds (ref 24–36)

## 2017-09-01 MED ORDER — MYCOPHENOLATE MOFETIL 250 MG PO CAPS
500.0000 mg | ORAL_CAPSULE | Freq: Two times a day (BID) | ORAL | Status: DC
  Administered 2017-09-01 – 2017-09-04 (×7): 500 mg via ORAL
  Filled 2017-09-01 (×9): qty 2

## 2017-09-01 MED ORDER — CHLORHEXIDINE GLUCONATE CLOTH 2 % EX PADS
6.0000 | MEDICATED_PAD | Freq: Every day | CUTANEOUS | Status: DC
  Administered 2017-09-01 – 2017-09-02 (×2): 6 via TOPICAL

## 2017-09-01 MED ORDER — TACROLIMUS 0.5 MG PO CAPS
0.5000 mg | ORAL_CAPSULE | Freq: Every day | ORAL | Status: DC
  Administered 2017-09-01 – 2017-09-03 (×3): 0.5 mg via ORAL
  Filled 2017-09-01 (×5): qty 1

## 2017-09-01 MED ORDER — TACROLIMUS 1 MG PO CAPS
1.0000 mg | ORAL_CAPSULE | Freq: Every day | ORAL | Status: DC
  Administered 2017-09-02 – 2017-09-04 (×3): 1 mg via ORAL
  Filled 2017-09-01 (×3): qty 1

## 2017-09-01 MED ORDER — LEVOTHYROXINE SODIUM 25 MCG PO TABS
137.0000 ug | ORAL_TABLET | Freq: Every day | ORAL | Status: DC
  Administered 2017-09-02 – 2017-09-04 (×3): 137 ug via ORAL
  Filled 2017-09-01 (×3): qty 1

## 2017-09-01 MED ORDER — HYDROCODONE-ACETAMINOPHEN 7.5-325 MG PO TABS
1.0000 | ORAL_TABLET | Freq: Four times a day (QID) | ORAL | Status: DC | PRN
  Administered 2017-09-01 – 2017-09-03 (×4): 1 via ORAL
  Filled 2017-09-01 (×5): qty 1

## 2017-09-01 MED ORDER — MAGNESIUM SULFATE 4 GM/100ML IV SOLN
4.0000 g | Freq: Once | INTRAVENOUS | Status: AC
  Administered 2017-09-01: 4 g via INTRAVENOUS
  Filled 2017-09-01: qty 100

## 2017-09-01 MED ORDER — PIPERACILLIN-TAZOBACTAM 3.375 G IVPB
3.3750 g | Freq: Three times a day (TID) | INTRAVENOUS | Status: DC
  Administered 2017-09-01 (×2): 3.375 g via INTRAVENOUS
  Filled 2017-09-01 (×4): qty 50

## 2017-09-01 MED ORDER — INSULIN ASPART PROT & ASPART (70-30 MIX) 100 UNIT/ML ~~LOC~~ SUSP
20.0000 [IU] | Freq: Two times a day (BID) | SUBCUTANEOUS | Status: DC
  Administered 2017-09-01 – 2017-09-02 (×3): 20 [IU] via SUBCUTANEOUS
  Filled 2017-09-01: qty 10

## 2017-09-01 MED ORDER — MUPIROCIN 2 % EX OINT
1.0000 "application " | TOPICAL_OINTMENT | Freq: Two times a day (BID) | CUTANEOUS | Status: DC
  Administered 2017-09-01 – 2017-09-04 (×7): 1 via NASAL
  Filled 2017-09-01: qty 22

## 2017-09-01 MED ORDER — MUPIROCIN 2 % EX OINT: 1.0000 "application " | TOPICAL_OINTMENT | Freq: Two times a day (BID) | CUTANEOUS | Status: DC

## 2017-09-01 MED ORDER — PIPERACILLIN-TAZOBACTAM 3.375 G IVPB
3.3750 g | Freq: Four times a day (QID) | INTRAVENOUS | Status: DC
Start: 2017-09-01 — End: 2017-09-01

## 2017-09-01 MED ORDER — PIPERACILLIN-TAZOBACTAM 3.375 G IVPB 30 MIN
3.3750 g | Freq: Four times a day (QID) | INTRAVENOUS | Status: DC
  Filled 2017-09-01: qty 50

## 2017-09-01 MED ORDER — PREDNISONE 5 MG PO TABS
5.0000 mg | ORAL_TABLET | Freq: Every day | ORAL | Status: DC
  Administered 2017-09-02 – 2017-09-04 (×3): 5 mg via ORAL
  Filled 2017-09-01 (×3): qty 1

## 2017-09-01 MED ORDER — SODIUM BICARBONATE 650 MG PO TABS
1300.0000 mg | ORAL_TABLET | Freq: Two times a day (BID) | ORAL | Status: DC
  Administered 2017-09-01 – 2017-09-04 (×6): 1300 mg via ORAL
  Filled 2017-09-01 (×7): qty 2

## 2017-09-01 NOTE — Progress Notes (Signed)
CRITICAL VALUE ALERT  Critical Value:  MRSA PCR Positive  Date & Time Notied:  09/01/2017 @ 02:52  Provider Notified: Deterding, E.  Orders Received/Actions taken: MRSA positive order set initiated per protocol.

## 2017-09-01 NOTE — Progress Notes (Signed)
eLink Physician-Brief Progress Note Patient Name: [name] DOB: Nov 18, 1965 MRN: [id number]   Date of Service  09/01/2017  HPI/Events of Note  hypomag  eICU Interventions  Mag replaced     Intervention Category Intermediate Interventions: Electrolyte abnormality - evaluation and management  DETERDING,ELIZABETH 09/01/2017, 4:31 AM

## 2017-09-01 NOTE — Progress Notes (Signed)
Inpatient Diabetes Program Recommendations  AACE/ADA: New Consensus Statement on Inpatient Glycemic Control (2015)  Target Ranges:  Prepandial:   less than 140 mg/dL      Peak postprandial:   less than 180 mg/dL (1-2 hours)      Critically ill patients:  140 - 180 mg/dL   Lab Results  Component Value Date   GLUCAP 204 (H) 09/01/2017   HGBA1C 9.4 (H) 04/25/2017    Review of Glycemic Control Results for Exie ParodyCARROLL, Yelena S (MRN [id number]) as of 09/01/2017 11:28  Ref. Range 09/01/2017 07:57 09/01/2017 08:57 09/01/2017 11:13  Glucose-Capillary Latest Ref Range: 65 - 99 mg/dL 562100 (H) 88 130204 (H)   Diabetes history: DM1 Outpatient Diabetes medications: Novolin 70/30 insulin mix 20 units ac meals bid Current orders for Inpatient glycemic control: Novolog 70/30 mix 20 units bid  Inpatient Diabetes Program Recommendations:   -Add Novolog sensitive scale correction tid + 0-5 units hs -Add 2 am CBG  -A1c to determine prehospital glycemic control DM Coordinator spoke with patient on 05/10/17 admission and patient was admitted after drinking multiple wine coolers but had sufficient insulin. Will follow during hospitalization.  Thank you, Billy FischerJudy E. Sloan Takagi, RN, MSN, CDE  Diabetes Coordinator Inpatient Glycemic Control Team Team Pager [phone] (8am-5pm) 09/01/2017 11:28 AM

## 2017-09-01 NOTE — Consult Note (Signed)
Reason for Consult:Renal Transplant, AKI Referring Physician: Dr. Rennis Golden [name] is an 51 y.o. female.  HPI: 51 yr female with hx of Type 2 DM , brittle, (?type 1 now), HTN, Depression, nonadherence, who had  DDRT in 4/11.  Now admittede with weakness, ? Sepsis,?pneu, and DKA.  Recurrent admits with DKA this year.  Had hip fx 11/18 and went to NH.  Just d/c on Mon.  Does not monitor BS, reg or adjust insulin.  Has hx PVD with 3 toes off on R and 1 on L.   No N, V but loose stools recently.  No cough or sputum production.  Some leg cramping but no reddness.  No dysuria. No rash.  Has arth of elbows and at site of fx.  Baseline Cr varies 1.5-2.3 depending on vol, glu control. Has had recurrent AKI with Cr up in 3s with DKA and infections. Has not been in office since 6/18 and had missed 67mo prior. Constitutional: weak, lethargy Eyes: decreasing vision, no opthal eval in last couple yrs,  floaters Ears, nose, mouth, throat, and face: negative Respiratory: more SOB Cardiovascular: negative Gastrointestinal: loose stools Genitourinary:negative Integument/breast: negative Musculoskeletal:elbow, hip.  LE weakness Neurological: as above Endocrine: as above Allergic/Immunologic: ACEI,   Latex, adhesives  Primary Nephrologist Colodonato. .  Past Medical History:  Diagnosis Date  . Arthritis    "elbows, knees, legs, back" (09/24/2015)  . CKD (chronic kidney disease)   . Daily headache   . Depression    "years ago"  . End stage renal disease (HCC)    right arm AV graft, resolved post transplant  . Hypertension   . Hypothyroid   . 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 Kidney disease 2011  . Pneumonia ~ 2007?  .Marland KitchenType II diabetes mellitus (HCC)    Insulin dependant    Past Surgical History:  Procedure Laterality Date  . AMPUTATION Left 05/11/2014   Procedure: AMPUTATION LEFT GREAT TOE;  Surgeon: CMcarthur Rossetti MD;  Location: [facility];  Service:  Orthopedics;  Laterality: Left;  . AV FISTULA PLACEMENT Right    forearm  . BACK SURGERY    . CATARACT EXTRACTION W/ INTRAOCULAR LENS  IMPLANT, BILATERAL Bilateral   . CESAREAN SECTION  07/1999  . DG AV DIALYSIS GRAFT DECLOT OR    . HIP PINNING,CANNULATED Right 06/02/2017   Procedure: CANNULATED RIGHT HIP PINNING;  Surgeon: NNetta Cedars MD;  Location: [facility];  Service: Orthopedics;  Laterality: Right;  . INCISION AND DRAINAGE Right 06/02/2015   Procedure: INCISION AND DRAINAGE OF RIGHT 3rd RAY RESECTION;  Surgeon: CMcarthur Rossetti MD;  [place] [facility]  Service: Orthopedics;  Laterality: Right;  . 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  . NEPHRECTOMY TRANSPLANTED ORGAN    . TOE AMPUTATION Right    1,2 & 3rd toes.  . TUBAL LIGATION  07/1999    Family History  Problem Relation Age of Onset  . Emphysema Mother   . Throat cancer Mother   . COPD Mother   . Cancer Mother   . Emphysema Father   . COPD Father   . Stroke Father   . ADD / ADHD Son     Social History:  reports that she quit smoking about 18 years ago. Her smoking use included cigarettes. She has a 11.00 pack-year smoking history. she has never used smokeless tobacco. She reports that she does not drink alcohol or  use drugs.  Allergies:  Allergies  Allergen Reactions  . Latex Itching  . Ace Inhibitors Cough  . Adhesive [Tape] Itching and Other (See Comments)    Please use paper tape  . Lisinopril Cough    Medications:  I have reviewed the patient's current medications. Prior to Admission:  Medications Prior to Admission  Medication Sig Dispense Refill Last Dose  . acetaminophen (TYLENOL) 325 MG tablet Take 2 tablets (650 mg total) by mouth every 6 (six) hours as needed for mild pain, moderate pain, fever or headache.   PRN at PRN  . amLODipine (NORVASC) 10 MG tablet Take 1 tablet (10 mg total) by mouth daily. (Patient taking differently: Take 10 mg by mouth at bedtime. ) 30  tablet 0 08/30/2017 at pm  . aspirin EC 81 MG tablet Take 81 mg by mouth daily as needed (for "heart health").    Unk at [facility]  . carvedilol (COREG) 25 MG tablet Take 50 mg by mouth 2 (two) times daily.  3 08/31/2017 at 0700  . HYDROcodone-acetaminophen (NORCO) 5-325 MG tablet Take 0.5-1 tablets by mouth every 4 (four) hours as needed for moderate pain or severe pain. 30 tablet 0 08/29/2017 at Unknown time  . Insulin Isophane & Regular Human (NOVOLIN 70/30 FLEXPEN RELION) (70-30) 100 UNIT/ML PEN Inject 20 Units into the skin 2 (two) times daily before a meal. PER SLIDING SCALE   08/31/2017 at am  . mycophenolate (CELLCEPT) 250 MG capsule Take 500 mg by mouth 2 (two) times daily.    08/31/2017 at am  . predniSONE (DELTASONE) 5 MG tablet Take 5 mg by mouth daily.   6 08/31/2017 at am  . PROGRAF 1 MG capsule Take 1 mg by mouth every morning.   3 08/31/2017 at am  . Pseudoeph-Doxylamine-DM-APAP (TYLENOL COLD/FLU SEVERE NIGHT PO) Take 10-15 mLs by mouth every 6 (six) hours as needed (for symptoms).   08/31/2017 at am  . SYNTHROID 137 MCG tablet Take 137 mcg by mouth daily.  5 08/31/2017 at am  . tacrolimus (PROGRAF) 0.5 MG capsule Take 0.5 mg by mouth every evening.    08/30/2017 at pm  . Continuous Blood Gluc Receiver (FREESTYLE LIBRE READER) DEVI 1 each by Does not apply route 4 (four) times daily. 1 Device 0 Unk at [employer]  . Continuous Blood Gluc Sensor (FREESTYLE LIBRE SENSOR SYSTEM) MISC 1 sensor every 10 days 3 each 2 Unk at [facility]  . doxycycline (VIBRA-TABS) 100 MG tablet Take 1 tablet (100 mg total) by mouth every 12 (twelve) hours. (Patient not taking: Reported on 08/31/2017) 10 tablet 0 Not Taking at Unknown time  . enoxaparin (LOVENOX) 40 MG/0.4ML injection Inject 0.4 mLs (40 mg total) into the skin daily. 30 days post op (Patient not taking: Reported on 08/31/2017) 30 mL 0 Not Taking at Unknown time  . hydrALAZINE (APRESOLINE) 25 MG tablet Take 1 tablet (25 mg total) by mouth every 12 (twelve) hours.  (Patient not taking: Reported on 08/31/2017) 60 tablet 1 Not Taking at Unknown time  . insulin NPH-regular Human (NOVOLIN 70/30) (70-30) 100 UNIT/ML injection Inject 20 Units into the skin 2 (two) times daily with a meal. (Patient not taking: Reported on 08/31/2017) 10 mL 6 Not Taking at Unknown time  . vitamin B-12 (CYANOCOBALAMIN) 100 MCG tablet Take 1 tablet (100 mcg total) by mouth daily. 30 tablet 0 Unk at [facility]     Results for orders placed or performed during the hospital encounter of 08/31/17 (from the past 48  hour(s))  Brain natriuretic peptide     Status: Abnormal   Collection Time: 08/31/17  3:35 PM  Result Value Ref Range   B Natriuretic Peptide 1,022.8 (H) 0.0 - 100.0 pg/mL  CBC with Differential     Status: Abnormal   Collection Time: 08/31/17  3:53 PM  Result Value Ref Range   WBC 12.8 (H) 4.0 - 10.5 K/uL   RBC 3.21 (L) 3.87 - 5.11 MIL/uL   Hemoglobin 9.0 (L) 12.0 - 15.0 g/dL   HCT 32.2 (L) 36.0 - 46.0 %   MCV 100.3 (H) 78.0 - 100.0 fL   MCH 28.0 26.0 - 34.0 pg   MCHC 28.0 (L) 30.0 - 36.0 g/dL   RDW 14.1 11.5 - 15.5 %   Platelets 220 150 - 400 K/uL   Neutrophils Relative % 89 %   Neutro Abs 11.5 (H) 1.7 - 7.7 K/uL   Lymphocytes Relative 5 %   Lymphs Abs 0.6 (L) 0.7 - 4.0 K/uL   Monocytes Relative 6 %   Monocytes Absolute 0.7 0.1 - 1.0 K/uL   Eosinophils Relative 0 %   Eosinophils Absolute 0.0 0.0 - 0.7 K/uL   Basophils Relative 0 %   Basophils Absolute 0.0 0.0 - 0.1 K/uL  Comprehensive metabolic panel     Status: Abnormal   Collection Time: 08/31/17  3:53 PM  Result Value Ref Range   Sodium 128 (L) 135 - 145 mmol/L   Potassium 6.5 (HH) 3.5 - 5.1 mmol/L    Comment: NO VISIBLE HEMOLYSIS CRITICAL RESULT CALLED TO, READ BACK BY AND VERIFIED WITH: C CLEMMER,RN [phone] WILDERK    Chloride 97 (L) 101 - 111 mmol/L   CO2 <7 (L) 22 - 32 mmol/L   Glucose, Bld 842 (HH) 65 - 99 mg/dL    Comment: CRITICAL RESULT CALLED TO, READ BACK BY AND VERIFIED WITH: C CLEMMER,RN  [phone] WILDERK    BUN 51 (H) 6 - 20 mg/dL   Creatinine, Ser 3.36 (H) 0.44 - 1.00 mg/dL   Calcium 8.6 (L) 8.9 - 10.3 mg/dL   Total Protein 5.8 (L) 6.5 - 8.1 g/dL   Albumin 2.7 (L) 3.5 - 5.0 g/dL   AST 17 15 - 41 U/L   ALT 14 14 - 54 U/L   Alkaline Phosphatase 106 38 - 126 U/L   Total Bilirubin 2.2 (H) 0.3 - 1.2 mg/dL   GFR calc non Af Amer 15 (L) >60 mL/min   GFR calc Af Amer 17 (L) >60 mL/min    Comment: (NOTE) The eGFR has been calculated using the CKD EPI equation. This calculation has not been validated in all clinical situations. eGFR's persistently <60 mL/min signify possible Chronic Kidney Disease.    Anion gap NOT CALCULATED 5 - 15  Blood culture (routine x 2)     Status: None (Preliminary result)   Collection Time: 08/31/17  3:54 PM  Result Value Ref Range   Specimen Description BLOOD BLOOD LEFT FOREARM    Special Requests      IN BOTH AEROBIC AND ANAEROBIC BOTTLES Blood Culture adequate volume   Culture  Setup Time      GRAM POSITIVE COCCI IN BOTH AEROBIC AND ANAEROBIC BOTTLES Organism ID to follow CRITICAL RESULT CALLED TO, READ BACK BY AND VERIFIED WITH: N. Batchelder Pharm.D. 12:20 09/01/17 ([email or web address])    Culture GRAM POSITIVE COCCI    Report Status PENDING   Blood Culture ID Panel (Reflexed)     Status: Abnormal   Collection Time: 08/31/17  3:54 PM  Result Value Ref Range   Enterococcus species NOT DETECTED NOT DETECTED   Listeria monocytogenes NOT DETECTED NOT DETECTED   Staphylococcus species DETECTED (A) NOT DETECTED    Comment: Methicillin (oxacillin) susceptible coagulase negative staphylococcus. Possible blood culture contaminant (unless isolated from more than one blood culture draw or clinical case suggests pathogenicity). No antibiotic treatment is indicated for blood  culture contaminants. CRITICAL RESULT CALLED TO, READ BACK BY AND VERIFIED WITH: N. Batchelder Pharm.D. 12:20 09/01/17 ([email or web address])    Staphylococcus aureus NOT DETECTED NOT DETECTED    Methicillin resistance NOT DETECTED NOT DETECTED   Streptococcus species NOT DETECTED NOT DETECTED   Streptococcus agalactiae NOT DETECTED NOT DETECTED   Streptococcus pneumoniae NOT DETECTED NOT DETECTED   Streptococcus pyogenes NOT DETECTED NOT DETECTED   Acinetobacter baumannii NOT DETECTED NOT DETECTED   Enterobacteriaceae species NOT DETECTED NOT DETECTED   Enterobacter cloacae complex NOT DETECTED NOT DETECTED   Escherichia coli NOT DETECTED NOT DETECTED   Klebsiella oxytoca NOT DETECTED NOT DETECTED   Klebsiella pneumoniae NOT DETECTED NOT DETECTED   Proteus species NOT DETECTED NOT DETECTED   Serratia marcescens NOT DETECTED NOT DETECTED   Haemophilus influenzae NOT DETECTED NOT DETECTED   Neisseria meningitidis NOT DETECTED NOT DETECTED   Pseudomonas aeruginosa NOT DETECTED NOT DETECTED   Candida albicans NOT DETECTED NOT DETECTED   Candida glabrata NOT DETECTED NOT DETECTED   Candida krusei NOT DETECTED NOT DETECTED   Candida parapsilosis NOT DETECTED NOT DETECTED   Candida tropicalis NOT DETECTED NOT DETECTED  I-Stat Chem 8, ED     Status: Abnormal   Collection Time: 08/31/17  4:10 PM  Result Value Ref Range   Sodium 128 (L) 135 - 145 mmol/L   Potassium 6.5 (HH) 3.5 - 5.1 mmol/L   Chloride 101 101 - 111 mmol/L   BUN 43 (H) 6 - 20 mg/dL   Creatinine, Ser 2.50 (H) 0.44 - 1.00 mg/dL   Glucose, Bld >700 (HH) 65 - 99 mg/dL   Calcium, Ion 1.23 1.15 - 1.40 mmol/L   TCO2 7 (L) 22 - 32 mmol/L   Hemoglobin 10.2 (L) 12.0 - 15.0 g/dL   HCT 30.0 (L) 36.0 - 46.0 %   Comment NOTIFIED PHYSICIAN   I-Stat CG4 Lactic Acid, ED     Status: Abnormal   Collection Time: 08/31/17  4:11 PM  Result Value Ref Range   Lactic Acid, Venous 2.19 (HH) 0.5 - 1.9 mmol/L   Comment NOTIFIED PHYSICIAN   POC CBG, ED     Status: Abnormal   Collection Time: 08/31/17  4:43 PM  Result Value Ref Range   Glucose-Capillary >600 (HH) 65 - 99 mg/dL   Comment 1 Notify RN    Comment 2 Document in Chart    CBG monitoring, ED     Status: Abnormal   Collection Time: 08/31/17  6:00 PM  Result Value Ref Range   Glucose-Capillary >600 (HH) 65 - 99 mg/dL  I-Stat CG4 Lactic Acid, ED     Status: Abnormal   Collection Time: 08/31/17  6:25 PM  Result Value Ref Range   Lactic Acid, Venous 2.03 (HH) 0.5 - 1.9 mmol/L   Comment NOTIFIED PHYSICIAN   CBG monitoring, ED     Status: Abnormal   Collection Time: 08/31/17  7:13 PM  Result Value Ref Range   Glucose-Capillary >600 (HH) 65 - 99 mg/dL   Comment 1 Notify RN    Comment 2 Document in Chart   Urinalysis, Routine w  reflex microscopic     Status: Abnormal   Collection Time: 08/31/17  7:50 PM  Result Value Ref Range   Color, Urine YELLOW YELLOW   APPearance HAZY (A) CLEAR   Specific Gravity, Urine 1.017 1.005 - 1.030   pH 5.0 5.0 - 8.0   Glucose, UA >=500 (A) NEGATIVE mg/dL   Hgb urine dipstick NEGATIVE NEGATIVE   Bilirubin Urine NEGATIVE NEGATIVE   Ketones, ur 20 (A) NEGATIVE mg/dL   Protein, ur 100 (A) NEGATIVE mg/dL   Nitrite NEGATIVE NEGATIVE   Leukocytes, UA NEGATIVE NEGATIVE   RBC / HPF 0-5 0 - 5 RBC/hpf   WBC, UA 0-5 0 - 5 WBC/hpf   Bacteria, UA RARE (A) NONE SEEN   Squamous Epithelial / LPF NONE SEEN NONE SEEN   Budding Yeast PRESENT    Hyaline Casts, UA PRESENT   Basic metabolic panel     Status: Abnormal   Collection Time: 08/31/17  8:04 PM  Result Value Ref Range   Sodium 133 (L) 135 - 145 mmol/L   Potassium 4.6 3.5 - 5.1 mmol/L   Chloride 104 101 - 111 mmol/L   CO2 <7 (L) 22 - 32 mmol/L   Glucose, Bld 612 (HH) 65 - 99 mg/dL    Comment: CRITICAL RESULT CALLED TO, READ BACK BY AND VERIFIED WITH: WALLACE [facility] 08/31/17 2109 WAYK    BUN 50 (H) 6 - 20 mg/dL   Creatinine, Ser 3.23 (H) 0.44 - 1.00 mg/dL   Calcium 7.8 (L) 8.9 - 10.3 mg/dL   GFR calc non Af Amer 16 (L) >60 mL/min   GFR calc Af Amer 18 (L) >60 mL/min    Comment: (NOTE) The eGFR has been calculated using the CKD EPI equation. This calculation has not been  validated in all clinical situations. eGFR's persistently <60 mL/min signify possible Chronic Kidney Disease.   CBC     Status: Abnormal   Collection Time: 08/31/17  8:04 PM  Result Value Ref Range   WBC 7.3 4.0 - 10.5 K/uL   RBC 2.97 (L) 3.87 - 5.11 MIL/uL   Hemoglobin 8.5 (L) 12.0 - 15.0 g/dL   HCT 28.5 (L) 36.0 - 46.0 %   MCV 96.0 78.0 - 100.0 fL   MCH 28.6 26.0 - 34.0 pg   MCHC 29.8 (L) 30.0 - 36.0 g/dL   RDW 13.7 11.5 - 15.5 %   Platelets 174 150 - 400 K/uL  CBG monitoring, ED     Status: Abnormal   Collection Time: 08/31/17  8:26 PM  Result Value Ref Range   Glucose-Capillary 528 (HH) 65 - 99 mg/dL  C difficile quick scan w PCR reflex     Status: None   Collection Time: 08/31/17  9:20 PM  Result Value Ref Range   C Diff antigen NEGATIVE NEGATIVE   C Diff toxin NEGATIVE NEGATIVE   C Diff interpretation No C. difficile detected.   Glucose, capillary     Status: Abnormal   Collection Time: 08/31/17  9:43 PM  Result Value Ref Range   Glucose-Capillary 453 (H) 65 - 99 mg/dL  Glucose, capillary     Status: Abnormal   Collection Time: 08/31/17 10:55 PM  Result Value Ref Range   Glucose-Capillary 368 (H) 65 - 99 mg/dL   Comment 1 Document in Chart   MRSA PCR Screening     Status: Abnormal   Collection Time: 08/31/17 11:18 PM  Result Value Ref Range   MRSA by PCR POSITIVE (A) NEGATIVE  Comment:        The GeneXpert MRSA Assay (FDA approved for NASAL specimens only), is one component of a comprehensive MRSA colonization surveillance program. It is not intended to diagnose MRSA infection nor to guide or monitor treatment for MRSA infections. RESULT CALLED TO, READ BACK BY AND VERIFIED WITH: B SHEPHERD RN 0251 09/01/17 A BROWNING   Basic metabolic panel     Status: Abnormal   Collection Time: 08/31/17 11:34 PM  Result Value Ref Range   Sodium 133 (L) 135 - 145 mmol/L   Potassium 4.2 3.5 - 5.1 mmol/L   Chloride 104 101 - 111 mmol/L   CO2 10 (L) 22 - 32 mmol/L    Glucose, Bld 375 (H) 65 - 99 mg/dL   BUN 50 (H) 6 - 20 mg/dL   Creatinine, Ser 3.04 (H) 0.44 - 1.00 mg/dL   Calcium 8.0 (L) 8.9 - 10.3 mg/dL   GFR calc non Af Amer 17 (L) >60 mL/min   GFR calc Af Amer 20 (L) >60 mL/min    Comment: (NOTE) The eGFR has been calculated using the CKD EPI equation. This calculation has not been validated in all clinical situations. eGFR's persistently <60 mL/min signify possible Chronic Kidney Disease.    Anion gap 19 (H) 5 - 15  Glucose, capillary     Status: Abnormal   Collection Time: 09/01/17 12:07 AM  Result Value Ref Range   Glucose-Capillary 316 (H) 65 - 99 mg/dL   Comment 1 Document in Chart   Glucose, capillary     Status: Abnormal   Collection Time: 09/01/17  1:07 AM  Result Value Ref Range   Glucose-Capillary 262 (H) 65 - 99 mg/dL   Comment 1 Document in Chart   Glucose, capillary     Status: Abnormal   Collection Time: 09/01/17  2:05 AM  Result Value Ref Range   Glucose-Capillary 227 (H) 65 - 99 mg/dL   Comment 1 Document in Chart   APTT     Status: None   Collection Time: 09/01/17  2:55 AM  Result Value Ref Range   aPTT 36 24 - 36 seconds  CBC with Differential/Platelet     Status: Abnormal   Collection Time: 09/01/17  2:55 AM  Result Value Ref Range   WBC 6.5 4.0 - 10.5 K/uL   RBC 3.15 (L) 3.87 - 5.11 MIL/uL   Hemoglobin 8.8 (L) 12.0 - 15.0 g/dL   HCT 27.5 (L) 36.0 - 46.0 %   MCV 87.3 78.0 - 100.0 fL    Comment: REPEATED TO VERIFY DELTA CHECK NOTED    MCH 27.9 26.0 - 34.0 pg   MCHC 32.0 30.0 - 36.0 g/dL   RDW 13.4 11.5 - 15.5 %   Platelets 157 150 - 400 K/uL   Neutrophils Relative % 88 %   Neutro Abs 5.7 1.7 - 7.7 K/uL   Lymphocytes Relative 4 %   Lymphs Abs 0.3 (L) 0.7 - 4.0 K/uL   Monocytes Relative 8 %   Monocytes Absolute 0.5 0.1 - 1.0 K/uL   Eosinophils Relative 0 %   Eosinophils Absolute 0.0 0.0 - 0.7 K/uL   Basophils Relative 0 %   Basophils Absolute 0.0 0.0 - 0.1 K/uL  Comprehensive metabolic panel     Status:  Abnormal   Collection Time: 09/01/17  2:55 AM  Result Value Ref Range   Sodium 135 135 - 145 mmol/L   Potassium 3.6 3.5 - 5.1 mmol/L   Chloride 106 101 - 111  mmol/L   CO2 18 (L) 22 - 32 mmol/L   Glucose, Bld 226 (H) 65 - 99 mg/dL   BUN 50 (H) 6 - 20 mg/dL   Creatinine, Ser 2.83 (H) 0.44 - 1.00 mg/dL   Calcium 7.7 (L) 8.9 - 10.3 mg/dL   Total Protein 5.1 (L) 6.5 - 8.1 g/dL   Albumin 2.2 (L) 3.5 - 5.0 g/dL   AST 27 15 - 41 U/L   ALT 17 14 - 54 U/L   Alkaline Phosphatase 85 38 - 126 U/L   Total Bilirubin 0.8 0.3 - 1.2 mg/dL   GFR calc non Af Amer 18 (L) >60 mL/min   GFR calc Af Amer 21 (L) >60 mL/min    Comment: (NOTE) The eGFR has been calculated using the CKD EPI equation. This calculation has not been validated in all clinical situations. eGFR's persistently <60 mL/min signify possible Chronic Kidney Disease.    Anion gap 11 5 - 15  Magnesium     Status: Abnormal   Collection Time: 09/01/17  2:55 AM  Result Value Ref Range   Magnesium 1.2 (L) 1.7 - 2.4 mg/dL  Protime-INR     Status: None   Collection Time: 09/01/17  2:55 AM  Result Value Ref Range   Prothrombin Time 14.7 11.4 - 15.2 seconds   INR 1.16   Glucose, capillary     Status: Abnormal   Collection Time: 09/01/17  3:19 AM  Result Value Ref Range   Glucose-Capillary 185 (H) 65 - 99 mg/dL   Comment 1 Document in Chart   Glucose, capillary     Status: Abnormal   Collection Time: 09/01/17  4:14 AM  Result Value Ref Range   Glucose-Capillary 174 (H) 65 - 99 mg/dL  Blood gas, arterial     Status: Abnormal   Collection Time: 09/01/17  4:15 AM  Result Value Ref Range   O2 Content 2.0 L/min   Delivery systems NASAL CANNULA    pH, Arterial 7.443 7.350 - 7.450   pCO2 arterial 30.5 (L) 32.0 - 48.0 mmHg   pO2, Arterial 68.4 (L) 83.0 - 108.0 mmHg   Bicarbonate 20.2 20.0 - 28.0 mmol/L   Acid-base deficit 3.0 (H) 0.0 - 2.0 mmol/L   O2 Saturation 92.9 %   Patient temperature 100.7    Collection site LEFT RADIAL    Drawn  by [id number]    Sample type ARTERIAL DRAW    Allens test (pass/fail) PASS PASS  Glucose, capillary     Status: Abnormal   Collection Time: 09/01/17  5:07 AM  Result Value Ref Range   Glucose-Capillary 150 (H) 65 - 99 mg/dL  Glucose, capillary     Status: Abnormal   Collection Time: 09/01/17  6:04 AM  Result Value Ref Range   Glucose-Capillary 125 (H) 65 - 99 mg/dL   Comment 1 Document in Chart   Glucose, capillary     Status: Abnormal   Collection Time: 09/01/17  6:58 AM  Result Value Ref Range   Glucose-Capillary 109 (H) 65 - 99 mg/dL   Comment 1 Document in Chart   Glucose, capillary     Status: Abnormal   Collection Time: 09/01/17  7:57 AM  Result Value Ref Range   Glucose-Capillary 100 (H) 65 - 99 mg/dL   Comment 1 Venous Specimen   Glucose, capillary     Status: None   Collection Time: 09/01/17  8:57 AM  Result Value Ref Range   Glucose-Capillary 88 65 - 99 mg/dL  Glucose, capillary     Status: Abnormal   Collection Time: 09/01/17 11:13 AM  Result Value Ref Range   Glucose-Capillary 204 (H) 65 - 99 mg/dL   Comment 1 Capillary Specimen    Comment 2 Notify RN   Basic metabolic panel     Status: Abnormal   Collection Time: 09/01/17  1:16 PM  Result Value Ref Range   Sodium 136 135 - 145 mmol/L   Potassium 3.6 3.5 - 5.1 mmol/L   Chloride 106 101 - 111 mmol/L   CO2 17 (L) 22 - 32 mmol/L   Glucose, Bld 227 (H) 65 - 99 mg/dL   BUN 44 (H) 6 - 20 mg/dL   Creatinine, Ser 2.59 (H) 0.44 - 1.00 mg/dL   Calcium 8.1 (L) 8.9 - 10.3 mg/dL   GFR calc non Af Amer 20 (L) >60 mL/min   GFR calc Af Amer 24 (L) >60 mL/min    Comment: (NOTE) The eGFR has been calculated using the CKD EPI equation. This calculation has not been validated in all clinical situations. eGFR's persistently <60 mL/min signify possible Chronic Kidney Disease.    Anion gap 13 5 - 15    Dg Chest Port 1 View  Result Date: 09/01/2017 CLINICAL DATA:  Shortness of breath, pneumonia, immunosuppressed dated,  sepsis. EXAM: PORTABLE CHEST 1 VIEW COMPARISON:  Portable chest x-ray of August 31, 2017 FINDINGS: The lungs are adequately inflated. The interstitial markings remain increased but the alveolar opacities have improved bilaterally. There is density projecting in the right lateral costophrenic angle more conspicuous than on the previous study. There is calcification in the wall of the aortic arch. The trachea is midline. The bony thorax exhibits no acute abnormality. IMPRESSION: Interval clearing of alveolar pneumonia especially on the left. Persistent mild interstitial prominence bilaterally. Electronically Signed   By: David  Martinique M.D.   On: 09/01/2017 07:27   Dg Chest Portable 1 View  Result Date: 08/31/2017 CLINICAL DATA:  Cough for 2 days EXAM: PORTABLE CHEST 1 VIEW COMPARISON:  None. FINDINGS: There is left upper lobe and left lower lobe airspace disease most concerning for pneumonia. There is no pleural effusion or pneumothorax. The heart and mediastinal contours are unremarkable. The osseous structures are unremarkable. IMPRESSION: Left upper lobe and left lower lobe pneumonia. Followup PA and lateral chest X-ray is recommended in 3-4 weeks following trial of antibiotic therapy to ensure resolution and exclude underlying malignancy. Electronically Signed   By: Kathreen Devoid   On: 08/31/2017 16:47    ROS Blood pressure 138/62, pulse 82, temperature 98.9 F (37.2 C), temperature source Oral, resp. rate (!) 22, height 5' 8" (1.727 m), weight 81.4 kg (179 lb 7.3 oz), last menstrual period 11/19/2011, SpO2 99 %. Physical Exam Physical Examination: General appearance - chronically ill appearing and pale, obese Mental status - alert, oriented to person, place, and time, depressed mood Eyes - DM retinopathy Mouth - dental hygiene poor and many caries, rotten teeth Neck - adenopathy noted PCL Lymphatics - posterior cervical nodes Chest - decreased air entry noted bilat Heart - S1 and S2 normal,  systolic murmur QD8/2 at 2nd left intercostal space Abdomen - obese, pos bs, liver down5 cm.  TX RLQ Extremities - missing 1st 3 toes on R and 1st toe on L Skin - pale, doughy, bruises  Assessment/Plan: 1 Renal Transplant with AKI appears DKA related with vol and osmolar damage.  Has CKD 4 at baseline and this will not help. Improving, need to get back  on Prograf.  ??adherence 2 DM poor control needs some supervision with her repeated prob 3 Hypertension: restart meds when rises 4. Anemia check Fe 5. Metabolic Bone Disease: check PTH 6 Nonadherence 7 PVD 8 Obesity 9 DM retinal dz needs OPTHAL f/u 10 ?Sepsis on AB, cultures pending. 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

## 2017-09-01 NOTE — Progress Notes (Signed)
PULMONARY / CRITICAL CARE MEDICINE   Name: [name] MRN: [id number] DOB: [birth year]    ADMISSION DATE:  08/31/2017 CONSULTATION DATE:  08/31/2017  REFERRING MD:  ED Erma Heritage)  CHIEF COMPLAINT:Shortness of breath fatigue hyperglycemia DKA  HISTORY OF PRESENT ILLNESS:   Patient is 51 years old Caucasian female who looks much older than her stated age with a very complex past medical history of kidney transplant on steroids hypertension diabetes hyperlipidemia recent discharge from rehab after she had right hip fracture and ORIF she went home a week ago she has been feeling sick with cough shortness of breath fatigue and severe hyperglycemia patient is denying fever but she had chills and rigors she is feeling very short of breath now and she is saying she took her medication for diabetes but she is in poor control and severe DKA. She also has very severe polyuria polydipsia very dry mouth. Patient is seeing when she left rehab a week ago she has shortness of breath and minimal cough.   PAST MEDICAL HISTORY :  She  has a past medical history of Arthritis, CKD (chronic kidney disease), Daily headache, Depression, End stage renal disease (HCC), Hypertension, Hypothyroid, Immunosuppression (HCC), Kidney disease (2011), Pneumonia (~ 2007?), and Type II diabetes mellitus (HCC).  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  Allergies  Allergen Reactions  . Latex Itching  . Ace Inhibitors Cough  . Adhesive [Tape] Itching and  Other (See Comments)    Please use paper tape  . Lisinopril Cough    No current facility-administered medications on file prior to encounter.    Current Outpatient Medications on File Prior to Encounter  Medication Sig  . acetaminophen (TYLENOL) 325 MG tablet Take 2 tablets (650 mg total) by mouth every 6 (six) hours as needed for mild pain, moderate pain, fever or headache.  [name] amLODipine (NORVASC) 10 MG tablet Take 1 tablet (10 mg total) by mouth daily. (Patient taking differently: Take 10 mg by mouth at bedtime. )  . aspirin EC 81 MG tablet Take 81 mg by mouth daily as needed (for "heart health").   . carvedilol (COREG) 25 MG tablet Take 50 mg by mouth 2 (two) times daily.  [name] HYDROcodone-acetaminophen (NORCO) 5-325 MG tablet Take 0.5-1 tablets by mouth every 4 (four) hours as needed for moderate pain or severe pain.  . Insulin Isophane & Regular Human (NOVOLIN 70/30 FLEXPEN RELION) (70-30) 100 UNIT/ML PEN Inject 20 Units into the skin 2 (two) times daily before a meal. PER SLIDING SCALE  . mycophenolate (CELLCEPT) 250 MG capsule Take 500 mg by mouth 2 (two) times daily.   . predniSONE (DELTASONE) 5 MG tablet Take 5 mg by mouth daily.   [name] PROGRAF 1 MG capsule Take 1 mg by mouth every morning.   . Pseudoeph-Doxylamine-DM-APAP (TYLENOL COLD/FLU SEVERE NIGHT PO) Take 10-15 mLs by mouth every 6 (six) hours as needed (for symptoms).  . SYNTHROID 137 MCG tablet Take 137 mcg by mouth daily.  . tacrolimus (PROGRAF) 0.5 MG capsule Take 0.5 mg by mouth every evening.   . Continuous Blood Gluc Receiver (FREESTYLE LIBRE READER) DEVI 1 each  by Does not apply route 4 (four) times daily.  . Continuous Blood Gluc Sensor (FREESTYLE LIBRE SENSOR SYSTEM) MISC 1 sensor every 10 days  . doxycycline (VIBRA-TABS) 100 MG tablet Take 1 tablet (100 mg total) by mouth every 12 (twelve) hours. (Patient not taking: Reported on 08/31/2017)  . enoxaparin (LOVENOX) 40 MG/0.4ML injection Inject 0.4 mLs (40 mg total) into  the skin daily. 30 days post op (Patient not taking: Reported on 08/31/2017)  . hydrALAZINE (APRESOLINE) 25 MG tablet Take 1 tablet (25 mg total) by mouth every 12 (twelve) hours. (Patient not taking: Reported on 08/31/2017)  . insulin NPH-regular Human (NOVOLIN 70/30) (70-30) 100 UNIT/ML injection Inject 20 Units into the skin 2 (two) times daily with a meal. (Patient not taking: Reported on 08/31/2017)  . vitamin B-12 (CYANOCOBALAMIN) 100 MCG tablet Take 1 tablet (100 mcg total) by mouth daily.    FAMILY HISTORY:  Her indicated that her mother is deceased. She indicated that her father is deceased. She indicated that the status of her son is unknown.   SOCIAL HISTORY: She  reports that she quit smoking about 18 years ago. Her smoking use included cigarettes. She has a 11.00 pack-year smoking history. she has never used smokeless tobacco. She reports that she does not drink alcohol or use drugs.  REVIEW OF SYSTEMS:   As noted above.  SUBJECTIVE:  Feels better today, less weak, reports dyspnea has improved. No cough.  VITAL SIGNS: BP (!) 143/59   Pulse 83   Temp 99 F (37.2 C) (Oral)   Resp 15   Ht 5\' 8"  (1.727 m)   Wt 179 lb 7.3 oz (81.4 kg)   LMP 11/19/2011   SpO2 99%   BMI 27.29 kg/m   HEMODYNAMICS:  On no vasoactive infusions  VENTILATOR SETTINGS:   On room air.  INTAKE / OUTPUT: I/O last 3 completed shifts: In: 5819.6 [I.V.:2619.6; IV Piggyback:3200] Out: 695 [Urine:320; Stool:375]  PHYSICAL EXAMINATION: General:  Moderately overweight. Appears, pale and fatigued. Older than stated age. Neuro:  Awake and oriented. Normal visual fields, some decreased acuity. CN's otherwise intact. No focal motor deficits. Stocking distribution loss of sensation to the knees bilaterally. HEENT:  Dry mucous membranes. Cardiovascular:  Normotensive, not tachycardic, extremities are warm.  S1,S2 normal with no murmurs or gallops. Lungs:  Clear bilaterally. Abdomen:  Soft non-tender  with no organomegaly Musculoskeletal:  No active joints Skin:  Venous stasis changes to both lower extremities.  LABS:  BMET Recent Labs  Lab 08/31/17 2004 08/31/17 2334 09/01/17 0255  NA 133* 133* 135  K 4.6 4.2 3.6  CL 104 104 106  CO2 <7* 10* 18*  BUN 50* 50* 50*  CREATININE 3.23* 3.04* 2.83*  GLUCOSE 612* 375* 226*    Electrolytes Recent Labs  Lab 08/31/17 2004 08/31/17 2334 09/01/17 0255  CALCIUM 7.8* 8.0* 7.7*  MG  --   --  1.2*    CBC Recent Labs  Lab 08/31/17 1553 08/31/17 1610 08/31/17 2004 09/01/17 0255  WBC 12.8*  --  7.3 6.5  HGB 9.0* 10.2* 8.5* 8.8*  HCT 32.2* 30.0* 28.5* 27.5*  PLT 220  --  174 157    Coag's Recent Labs  Lab 09/01/17 0255  APTT 36  INR 1.16    Sepsis Markers Recent Labs  Lab 08/31/17 1611 08/31/17 1825  LATICACIDVEN 2.19* 2.03*    ABG Recent Labs  Lab 09/01/17 0415  PHART 7.443  PCO2ART 30.5*  PO2ART 68.4*    Liver Enzymes Recent  Labs  Lab 08/31/17 1553 09/01/17 0255  AST 17 27  ALT 14 17  ALKPHOS 106 85  BILITOT 2.2* 0.8  ALBUMIN 2.7* 2.2*    Cardiac Enzymes No results for input(s): TROPONINI, PROBNP in the last 168 hours.  Glucose Recent Labs  Lab 09/01/17 0319 09/01/17 0414 09/01/17 0507 09/01/17 0604 09/01/17 0658 09/01/17 0757  GLUCAP 185* 174* 150* 125* 109* 100*    Imaging Dg Chest Port 1 View  Result Date: 09/01/2017 CLINICAL DATA:  Shortness of breath, pneumonia, immunosuppressed dated, sepsis. EXAM: PORTABLE CHEST 1 VIEW COMPARISON:  Portable chest x-ray of August 31, 2017 FINDINGS: The lungs are adequately inflated. The interstitial markings remain increased but the alveolar opacities have improved bilaterally. There is density projecting in the right lateral costophrenic angle more conspicuous than on the previous study. There is calcification in the wall of the aortic arch. The trachea is midline. The bony thorax exhibits no acute abnormality. IMPRESSION: Interval clearing  of alveolar pneumonia especially on the left. Persistent mild interstitial prominence bilaterally. Electronically Signed   By: David  SwazilandJordan M.D.   On: 09/01/2017 07:27   Dg Chest Portable 1 View  Result Date: 08/31/2017 CLINICAL DATA:  Cough for 2 days EXAM: PORTABLE CHEST 1 VIEW COMPARISON:  None. FINDINGS: There is left upper lobe and left lower lobe airspace disease most concerning for pneumonia. There is no pleural effusion or pneumothorax. The heart and mediastinal contours are unremarkable. The osseous structures are unremarkable. IMPRESSION: Left upper lobe and left lower lobe pneumonia. Followup PA and lateral chest X-ray is recommended in 3-4 weeks following trial of antibiotic therapy to ensure resolution and exclude underlying malignancy. Electronically Signed   By: Elige KoHetal  Patel   On: 08/31/2017 16:47    CULTURES: Now growing GPC in both bottles. Identification pending.   ANTIBIOTICS: Vancomycin and Zosyn   SIGNIFICANT EVENTS: Improvement with fluids and insulin infusion  LINES/TUBES: PIV's , Foley catheter, rectal tube  DISCUSSION: Present condition and plan of care discussed with patient at the bedside.  ASSESSMENT / PLAN:  PULMONARY A: Presumed CAP with LLL infiltrate.  P:   Continue current antibiotics. Now off oxygen. Begin progressive ambulation.  CARDIOVASCULAR A:  Hemodynamically stable, now mildly hypertensive. On no vasoactive infusions. P:  Continue to hold OP antihypertensives to maintain renal perfusion.  RENAL A:   Known renal transplant with AKI on CKD. No indication for dialysis. Improving creatinine.  P:   Have restarted Prednisone an MP for renal allograft. Holding  Tacrolimus due to AKI. Nephrology to see.  Now 5L positive. AG closed. Will stop IV fluids and transition to [place] insulin. Follow U/O and electrolytes, may require IV fluids back if Creatinine begins to climb again.  GASTROINTESTINAL A:   Chronic diarrhea of unclear cause.  P:    Advance diet. Pull rectal tube. Imodium for diarrhea. GI consultation if persists.  HEMATOLOGIC A:   Normocytic anemia with no signs of bleeding. Likely related to acute illness. P:  Observe for now, minimize blood draws, no indications for transfusion.  INFECTIOUS A:   Presented with sepsis syndrome and now GPC in blood. Source unclear.  P:   Continue current antibiotics until organism identified. Further imaging for source of infection as dictated by culture results.  ENDOCRINE A:   DKA in T1DM with HbA1C 9.4. BG has normalized and AG closed.   P:   Convert over to home insulin regimen  NEUROLOGIC A:   Signs of chronic diabetic neuropathy. P:  RASS goal: n/a. Continue home pain regimen.   FAMILY  - Updates: patient updated.  - Inter-disciplinary family meet or Palliative Care meeting due by:  day 7    Pulmonary and Critical Care Medicine [facility] Pager: [phone]  09/01/2017, 10:42 AM

## 2017-09-01 NOTE — Progress Notes (Signed)
PHARMACY - PHYSICIAN COMMUNICATION CRITICAL VALUE ALERT - BLOOD CULTURE IDENTIFICATION (BCID)  [name] is an 51 y.o. female who [facility] with a chief complaint of SOB.  Assessment:   51 yo F found to be septic but most likely related to DKA. Does show PNA on CXR so on abx for that. MRSA PCR is positive. Repeat CXR today shows some clearing of PNA.  Name of physician (or Provider) Contacted: Pola CornBen Mancheril [name] will discuss with CCM provider  Current antibiotics:  Vancomycin and Zosyn  Changes to prescribed antibiotics recommended:  Continue vancomycin and Zosyn for now, and consider de-escalation with clinical improvement for 7-8 day treatment Recommendations accepted by provider  Results for orders placed or performed during the hospital encounter of 08/31/17  Blood Culture ID Panel (Reflexed) (Collected: 08/31/2017  3:54 PM)  Result Value Ref Range   Enterococcus species NOT DETECTED NOT DETECTED   Listeria monocytogenes NOT DETECTED NOT DETECTED   Staphylococcus species DETECTED (A) NOT DETECTED   Staphylococcus aureus NOT DETECTED NOT DETECTED   Methicillin resistance NOT DETECTED NOT DETECTED   Streptococcus species NOT DETECTED NOT DETECTED   Streptococcus agalactiae NOT DETECTED NOT DETECTED   Streptococcus pneumoniae NOT DETECTED NOT DETECTED   Streptococcus pyogenes NOT DETECTED NOT DETECTED   Acinetobacter baumannii NOT DETECTED NOT DETECTED   Enterobacteriaceae species NOT DETECTED NOT DETECTED   Enterobacter cloacae complex NOT DETECTED NOT DETECTED   Escherichia coli NOT DETECTED NOT DETECTED   Klebsiella oxytoca NOT DETECTED NOT DETECTED   Klebsiella pneumoniae NOT DETECTED NOT DETECTED   Proteus species NOT DETECTED NOT DETECTED   Serratia marcescens NOT DETECTED NOT DETECTED   Haemophilus influenzae NOT DETECTED NOT DETECTED   Neisseria meningitidis NOT DETECTED NOT DETECTED   Pseudomonas aeruginosa NOT DETECTED NOT  DETECTED   Candida albicans NOT DETECTED NOT DETECTED   Candida glabrata NOT DETECTED NOT DETECTED   Candida krusei NOT DETECTED NOT DETECTED   Candida parapsilosis NOT DETECTED NOT DETECTED   Candida tropicalis NOT DETECTED NOT DETECTED   Enzo BiNathan Beacher [name], [name], BCPS Clinical Pharmacist Pager [phone] 09/01/2017 12:27 PM

## 2017-09-02 DIAGNOSIS — E43 Unspecified severe protein-calorie malnutrition: Secondary | ICD-10-CM

## 2017-09-02 LAB — GLUCOSE, CAPILLARY
GLUCOSE-CAPILLARY: 50 mg/dL — AB (ref 65–99)
GLUCOSE-CAPILLARY: 55 mg/dL — AB (ref 65–99)
GLUCOSE-CAPILLARY: 61 mg/dL — AB (ref 65–99)
GLUCOSE-CAPILLARY: 186 mg/dL — AB (ref 65–99)
Glucose-Capillary: 132 mg/dL — ABNORMAL HIGH (ref 65–99)
Glucose-Capillary: 64 mg/dL — ABNORMAL LOW (ref 65–99)
Glucose-Capillary: 50 mg/dL — ABNORMAL LOW (ref 65–99)
Glucose-Capillary: 52 mg/dL — ABNORMAL LOW (ref 65–99)
Glucose-Capillary: 47 mg/dL — ABNORMAL LOW (ref 65–99)
Glucose-Capillary: 100 mg/dL — ABNORMAL HIGH (ref 65–99)

## 2017-09-02 LAB — CULTURE, BLOOD (ROUTINE X 2)

## 2017-09-02 LAB — RENAL FUNCTION PANEL
ALBUMIN: 1.9 g/dL — AB (ref 3.5–5.0)
Anion gap: 9 (ref 5–15)
BUN: 35 mg/dL — AB (ref 6–20)
CO2: 23 mmol/L (ref 22–32)
CREATININE: 2.12 mg/dL — AB (ref 0.44–1.00)
Calcium: 8.4 mg/dL — ABNORMAL LOW (ref 8.9–10.3)
Chloride: 107 mmol/L (ref 101–111)
GFR, EST AFRICAN AMERICAN: 30 mL/min — AB (ref >60)
GFR, EST NON AFRICAN AMERICAN: 26 mL/min — AB (ref >60)
Glucose, Bld: 124 mg/dL — ABNORMAL HIGH (ref 65–99)
PHOSPHORUS: 3.8 mg/dL (ref 2.5–4.6)
POTASSIUM: 2.9 mmol/L — AB (ref 3.5–5.1)
Sodium: 139 mmol/L (ref 135–145)

## 2017-09-02 LAB — PARATHYROID HORMONE, INTACT (NO CA): PTH: 253 pg/mL — ABNORMAL HIGH (ref 15–65)

## 2017-09-02 LAB — CBC WITH DIFFERENTIAL/PLATELET
BASOS ABS: 0 10*3/uL (ref 0.0–0.1)
Basophils Relative: 0 %
EOS ABS: 0.1 10*3/uL (ref 0.0–0.7)
EOS PCT: 1 %
HCT: 29.4 % — ABNORMAL LOW (ref 36.0–46.0)
Hemoglobin: 9.4 g/dL — ABNORMAL LOW (ref 12.0–15.0)
LYMPHS PCT: 9 %
Lymphs Abs: 0.3 10*3/uL — ABNORMAL LOW (ref 0.7–4.0)
MCH: 28 pg (ref 26.0–34.0)
MCHC: 32 g/dL (ref 30.0–36.0)
MCV: 87.5 fL (ref 78.0–100.0)
Monocytes Absolute: 0.2 10*3/uL (ref 0.1–1.0)
Monocytes Relative: 5 %
Neutro Abs: 3.4 10*3/uL (ref 1.7–7.7)
Neutrophils Relative %: 85 %
PLATELETS: 120 10*3/uL — AB (ref 150–400)
RBC: 3.36 MIL/uL — AB (ref 3.87–5.11)
RDW: 13.9 % (ref 11.5–15.5)
WBC: 4 10*3/uL (ref 4.0–10.5)

## 2017-09-02 LAB — TACROLIMUS LEVEL: Tacrolimus (FK506) - LabCorp: 5.2 ng/mL (ref 2.0–20.0)

## 2017-09-02 MED ORDER — LOPERAMIDE HCL 2 MG PO CAPS
2.0000 mg | ORAL_CAPSULE | Freq: Once | ORAL | Status: AC
  Administered 2017-09-02: 2 mg via ORAL
  Filled 2017-09-02: qty 1

## 2017-09-02 MED ORDER — FREESTYLE LIBRE READER DEVI: 1.0000 | Freq: Four times a day (QID) | Status: DC

## 2017-09-02 MED ORDER — ENOXAPARIN SODIUM 40 MG/0.4ML ~~LOC~~ SOLN
40.0000 mg | SUBCUTANEOUS | Status: DC
  Administered 2017-09-02 – 2017-09-03 (×2): 40 mg via SUBCUTANEOUS
  Filled 2017-09-02 (×2): qty 0.4

## 2017-09-02 MED ORDER — MYCOPHENOLATE MOFETIL 250 MG PO CAPS: 500.0000 mg | ORAL_CAPSULE | Freq: Two times a day (BID) | ORAL | Status: DC

## 2017-09-02 MED ORDER — AMLODIPINE BESYLATE 10 MG PO TABS
10.0000 mg | ORAL_TABLET | Freq: Every day | ORAL | Status: DC
  Administered 2017-09-02 – 2017-09-03 (×2): 10 mg via ORAL
  Filled 2017-09-02 (×2): qty 1

## 2017-09-02 MED ORDER — CALCITRIOL 0.25 MCG PO CAPS
0.2500 ug | ORAL_CAPSULE | Freq: Every day | ORAL | Status: DC
  Administered 2017-09-02 – 2017-09-04 (×3): 0.25 ug via ORAL
  Filled 2017-09-02 (×3): qty 1

## 2017-09-02 MED ORDER — INSULIN ASPART PROT & ASPART (70-30 MIX) 100 UNIT/ML ~~LOC~~ SUSP
20.0000 [IU] | Freq: Two times a day (BID) | SUBCUTANEOUS | Status: DC
  Filled 2017-09-02: qty 10

## 2017-09-02 MED ORDER — CARVEDILOL 25 MG PO TABS: 50.0000 mg | ORAL_TABLET | Freq: Two times a day (BID) | ORAL | Status: DC

## 2017-09-02 MED ORDER — HYDROCODONE-ACETAMINOPHEN 5-325 MG PO TABS: 0.5000 | ORAL_TABLET | ORAL | Status: DC | PRN

## 2017-09-02 MED ORDER — TACROLIMUS 1 MG PO CAPS: 1.0000 mg | ORAL_CAPSULE | Freq: Every morning | ORAL | Status: DC

## 2017-09-02 MED ORDER — POTASSIUM CHLORIDE CRYS ER 20 MEQ PO TBCR
40.0000 meq | EXTENDED_RELEASE_TABLET | ORAL | Status: AC
  Administered 2017-09-02 (×3): 40 meq via ORAL
  Filled 2017-09-02 (×3): qty 2

## 2017-09-02 MED ORDER — INSULIN ASPART PROT & ASPART (70-30 MIX) 100 UNIT/ML ~~LOC~~ SUSP
12.0000 [IU] | Freq: Two times a day (BID) | SUBCUTANEOUS | Status: DC
  Administered 2017-09-03 – 2017-09-04 (×3): 12 [IU] via SUBCUTANEOUS

## 2017-09-02 MED ORDER — SACCHAROMYCES BOULARDII 250 MG PO CAPS
250.0000 mg | ORAL_CAPSULE | Freq: Two times a day (BID) | ORAL | Status: DC
  Administered 2017-09-02 – 2017-09-04 (×5): 250 mg via ORAL
  Filled 2017-09-02 (×6): qty 1

## 2017-09-02 MED ORDER — CARVEDILOL 12.5 MG PO TABS
12.5000 mg | ORAL_TABLET | Freq: Two times a day (BID) | ORAL | Status: DC
  Administered 2017-09-02 – 2017-09-04 (×5): 12.5 mg via ORAL
  Filled 2017-09-02 (×5): qty 1

## 2017-09-02 MED ORDER — ACETAMINOPHEN 325 MG PO TABS: 650.0000 mg | ORAL_TABLET | Freq: Four times a day (QID) | ORAL | Status: DC | PRN

## 2017-09-02 MED ORDER — ASPIRIN EC 81 MG PO TBEC: 81.0000 mg | DELAYED_RELEASE_TABLET | Freq: Every day | ORAL | Status: DC | PRN

## 2017-09-02 MED ORDER — PREDNISONE 5 MG PO TABS: 5.0000 mg | ORAL_TABLET | Freq: Every day | ORAL | Status: DC

## 2017-09-02 MED ORDER — ADULT MULTIVITAMIN W/MINERALS CH
1.0000 | ORAL_TABLET | Freq: Every day | ORAL | Status: DC
  Administered 2017-09-02 – 2017-09-04 (×3): 1 via ORAL
  Filled 2017-09-02 (×3): qty 1

## 2017-09-02 MED ORDER — LEVOTHYROXINE SODIUM 25 MCG PO TABS: 137.0000 ug | ORAL_TABLET | Freq: Every day | ORAL | Status: DC

## 2017-09-02 MED ORDER — CEFUROXIME AXETIL 500 MG PO TABS
500.0000 mg | ORAL_TABLET | Freq: Two times a day (BID) | ORAL | Status: DC
  Administered 2017-09-02 – 2017-09-04 (×6): 500 mg via ORAL
  Filled 2017-09-02 (×6): qty 1

## 2017-09-02 MED ORDER — TACROLIMUS 0.5 MG PO CAPS: 0.5000 mg | ORAL_CAPSULE | Freq: Every evening | ORAL | Status: DC

## 2017-09-02 MED ORDER — PREMIER PROTEIN SHAKE
11.0000 [oz_av] | Freq: Two times a day (BID) | ORAL | Status: DC
  Administered 2017-09-02: 11 [oz_av] via ORAL
  Filled 2017-09-02 (×3): qty 325.31

## 2017-09-02 MED ORDER — HYDRALAZINE HCL 25 MG PO TABS
25.0000 mg | ORAL_TABLET | Freq: Two times a day (BID) | ORAL | Status: DC
  Administered 2017-09-02 – 2017-09-04 (×4): 25 mg via ORAL
  Filled 2017-09-02 (×4): qty 1

## 2017-09-02 MED ORDER — VITAMIN B-12 100 MCG PO TABS: 100.0000 ug | ORAL_TABLET | Freq: Every day | ORAL | Status: DC

## 2017-09-02 MED ORDER — SODIUM CHLORIDE 0.9 % IV SOLN
510.0000 mg | Freq: Once | INTRAVENOUS | Status: AC
  Administered 2017-09-02: 510 mg via INTRAVENOUS
  Filled 2017-09-02: qty 17

## 2017-09-02 NOTE — Progress Notes (Signed)
Patient transferred to [phone] via wheelchair. Patient A/Ox4 on room air. Patient transferred on tele. VSS. Belongings sent with patient. Report given to receiving RN.

## 2017-09-02 NOTE — Care Management Note (Signed)
Case Management Note  Patient Details  Name: [name] MRN: [id number] Date of Birth: 1966-03-11  Subjective/Objective:  Pt admitted on 08/31/17 with DKA and CAP.  PTA, pt resided at home with spouse and children.  [facility]                    Action/Plan: Will follow for home needs as pt progresses.  PT recommending HH follow up; [facility].    Expected Discharge Date:  (unknown)               Expected Discharge Plan:  Home w Home Health Services  In-House Referral:     Discharge planning Services  CM Consult  Post Acute Care Choice:  Resumption of Svcs/PTA Provider Choice offered to:     DME Arranged:    DME Agency:     HH Arranged:    HH Agency:  [facility]  Status of Service:  In process, will continue to follow  If discussed at Long Length of Stay Meetings, dates discussed:    Additional Comments:  Glennon Macmerson, Zeffie Bickert M, RN 09/02/2017, 5:09 PM

## 2017-09-02 NOTE — Progress Notes (Signed)
Subjective: Interval History: has no complaint .  Objective: Vital signs in last 24 hours: Temp:  [97.7 F (36.5 C)-99 F (37.2 C)] 97.9 F (36.6 C) (12/27 0319) Pulse Rate:  [71-91] 71 (12/27 0700) Resp:  [8-22] 13 (12/27 0700) BP: (121-191)/(59-77) 191/76 (12/27 0700) SpO2:  [89 %-100 %] 100 % (12/27 0700) Weight change:   Intake/Output from previous day: 12/26 0701 - 12/27 0700 In: 1716.6 [P.O.:720; I.V.:696.6; IV Piggyback:300] Out: 2595 [Urine:1470; Stool:1125] Intake/Output this shift: No intake/output data recorded.  General appearance: alert, cooperative, moderately obese and pale Resp: rales LLL Cardio: S1, S2 normal and systolic murmur: systolic ejection 2/6, decrescendo at 2nd left intercostal space GI: obese, pos bs, soft, TX RLQ Extremities: edema 1+  Lab Results: Recent Labs    09/01/17 0255 09/02/17 0301  WBC 6.5 4.0  HGB 8.8* 9.4*  HCT 27.5* 29.4*  PLT 157 120*   BMET:  Recent Labs    09/01/17 1316 09/02/17 0301  NA 136 139  K 3.6 2.9*  CL 106 107  CO2 17* 23  GLUCOSE 227* 124*  BUN 44* 35*  CREATININE 2.59* 2.12*  CALCIUM 8.1* 8.4*   Recent Labs    09/01/17 1617  PTH 253*   Iron Studies:  Recent Labs    09/01/17 1617  IRON 12*  TIBC 217*    Studies/Results: Dg Chest Port 1 View  Result Date: 09/01/2017 CLINICAL DATA:  Shortness of breath, pneumonia, immunosuppressed dated, sepsis. EXAM: PORTABLE CHEST 1 VIEW COMPARISON:  Portable chest x-ray of August 31, 2017 FINDINGS: The lungs are adequately inflated. The interstitial markings remain increased but the alveolar opacities have improved bilaterally. There is density projecting in the right lateral costophrenic angle more conspicuous than on the previous study. There is calcification in the wall of the aortic arch. The trachea is midline. The bony thorax exhibits no acute abnormality. IMPRESSION: Interval clearing of alveolar pneumonia especially on the left. Persistent mild  interstitial prominence bilaterally. Electronically Signed   By: David  Swazil[place]Jordan M.D.   On: 09/01/2017 07:27   Dg Chest Portable 1 View  Result Date: 08/31/2017 CLINICAL DATA:  Cough for 2 days EXAM: PORTABLE CHEST 1 VIEW COMPARISON:  None. FINDINGS: There is left upper lobe and left lower lobe airspace disease most concerning for pneumonia. There is no pleural effusion or pneumothorax. The heart and mediastinal contours are unremarkable. The osseous structures are unremarkable. IMPRESSION: Left upper lobe and left lower lobe pneumonia. Followup PA and lateral chest X-ray is recommended in 3-4 weeks following trial of antibiotic therapy to ensure resolution and exclude underlying malignancy. Electronically Signed   By: Elige KoHetal  Patel   On: 08/31/2017 16:47    I have reviewed the patient's current medications.  Assessment/Plan: 1 CKD3-4, AKI in Renal transplant improving.  Vol improving, diuresing.  Baseline variable depending on Glu control 1.5-2.4 2 HTN lower vol and add coreg 3 DM controlled 4 Obesity 5 Gram pos sepsis  ?? pulm , unusual if staph  Defervesing, Id pending 6 PVD no evidence infx on exam 7 Nonadherence 8 Anemia Fe low bolus 9 HPTH vit D P Feraheme, vit D, Prograf, mycophenolate, Vanc, Zosyn    LOS: 2 days   [name] 09/02/2017,7:11 AM

## 2017-09-02 NOTE — Progress Notes (Signed)
Hypoglycemic Event  CBG: 64 at 1124 on 09/02/17  Treatment: 4 oz Juice at 1135          4 oz Juice at 1200         15 GR carb at 1218         Lunch Tray given around 1340             Symptoms: None  Follow-up CBG: Time:1200 CBG Result: 52      Time:1216    CBG Result: 50      Time:1259    CBG Result: 55                 Time:1317    CBG Result: 50                 Time:1404 CBG Result: 61       Possible Reasons for Event: Inadequate meal intake  Comments/MD notified: Dr. Denese KillingsAgarwala notified on unit after 1216 reading of 50. MD informed patient was given 8 oz juice with no response to treatment. MD recommended crackers and peanut butter and to recheck. Patient ate 1 pack of peanut butter and 1 pack of crackers. CBG rechecked and was 55. MD notified and recommended lunch tray and to recheck. Lunch given and CBG rechecked after lunch was 61. MD made aware. Since patient has remained asymptomatic throughout event RN to continue to monitor and recheck after dinner tray this evening. MD to adjust insulin dosage.      Estelle JuneLaura W Jaykob Minichiello

## 2017-09-02 NOTE — Progress Notes (Signed)
Critical care attending progress note:  Reason for admission: Generalized weakness and shortness of breath due to DKA precipitated by left lower lobe community-acquired pneumonia.  Past medical history: Long-standing type 1 diabetes with poor control (last hemoglobin A1c 9.4), hypertension, depression.  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  Prolonged stay in rehabilitation following hip fracture, recently discharged.  Admission history and course in hospital: Admitted 12/25.  Given fluids, start on insulin infusion, started antibiotics.  Significant 24-hour events: Gap closed and transitioned to subcutaneous insulin yesterday.  Improving creatinine.  Anti-rejection medications continued.  On rounds today:  Principal condition limiting discharge: Ready for transfer to floor, discharge home when relating safely  Principal safety issue: Gait unsteadiness and potential for falls.  CNS: Alert oriented with no focal deficits. Sedation vacation with dose reduction as appropriate: not applicable Respiratory: Denies shortness of breath or cough.  Chest is clear.  On room air has required oxygen at nighttime. -Stepdown to oral antibiotics.  Progressive ambulation. VAP bundle: Not indicated Appropriate for SBT: Extubated Smoking cessation: Non-smoker  Cardiovascular: Denies chest pain.  70s well perfused JVP not elevated, no peripheral edema, heart sounds normal.  Hypertensive up to 190/100. -Restart home antihypertensives.  GI/nutrition: Diarrhea has stopped.  Abdomen is soft and nontender. Stress ulcer prophylaxis: Not indicated Laxative regimen: none Enteral nutrition: Oral diabetic diet  Renal: Creatinine near baseline, improved to 2.12 -Remain off of IV fluids, allow volume status autoregulation. Foley catheter: To be removed Renal dose adjustment:  Hematology: Mild thrombocytopenia, likely Zosyn related DVT prophylaxis: Enoxaparin Transfusion indications: None.  Infectious  diseases: Blood cultures positive for coag negative Staphylococcus, likely represents contaminant Antibiotic de-escalation: Discontinue vancomycin and stepdown to oral Ceftin  Endocrine: Currently on home 70/30 20 units twice daily. Glycemic target: 80-180, patient currently well controlled.  Continue current regimen  Disposition: Ready to transfer.   Services provided include examination of the patient, review of relevant ancillary tests, prescription of lifesaving therapies, review of medications and prophylactic therapy and multidisciplinary rounding.

## 2017-09-02 NOTE — Evaluation (Signed)
Physical Therapy Evaluation Patient Details Name: [name] MRN: [id number] DOB: May 17, 1966 Today's Date: 09/02/2017   History of Present Illness  Pt is a 51 y/o female with a PMH significant for Long-standing type 1 diabetes with poor control, hypertension, depression. 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 She presents with generalized weakness and shortness of breath due to DKA precipitated by left lower lobe community-acquired pneumonia. Pt with recent ORIF of femoral fx and prolonged rehab stay, recently discharged.  Clinical Impression  Pt admitted with above diagnosis. Pt currently with functional limitations due to the deficits listed below (see PT Problem List). At the time of PT eval pt was able to perform transfers with gross min guard assist for balance support and safety. Pt anticipates d/c home when medically ready - however does not appear that she will have 24 hour support. Recommend someone be available at least the first day or so until balance and safety improve. Pt will benefit from skilled PT to increase their independence and safety with mobility to allow discharge to the venue listed below.       Follow Up Recommendations Home health PT;Supervision for mobility/OOB    Equipment Recommendations  None recommended by PT    Recommendations for Other Services       Precautions / Restrictions Precautions Precautions: Fall Restrictions Weight Bearing Restrictions: No      Mobility  Bed Mobility Overal bed mobility: Needs Assistance Bed Mobility: Rolling;Sidelying to Sit Rolling: Min guard Sidelying to sit: Min assist       General bed mobility comments: Assist for trunk elevation to full sitting position. Pt appeared confused momentarily as to the best way to sit up while already on her side. Required cues.   Transfers Overall transfer level: Needs assistance Equipment used: Rolling walker (2 wheeled) Transfers: Sit to/from UGI CorporationStand;Stand Pivot Transfers Sit to  Stand: Min guard Stand pivot transfers: Min guard       General transfer comment: VC's for hand placement on seated surface for safety. Pt was able to power-up to full stand and take a few pivotal steps around to the chair with hands-on guarding for balance and safety.   Ambulation/Gait                Stairs            Wheelchair Mobility    Modified Rankin (Stroke Patients Only)       Balance Overall balance assessment: Needs assistance Sitting-balance support: Feet supported;No upper extremity supported Sitting balance-Leahy Scale: Fair     Standing balance support: No upper extremity supported;During functional activity Standing balance-Leahy Scale: Fair                               Pertinent Vitals/Pain Pain Assessment: No/denies pain    Home Living Family/patient expects to be discharged to:: Private residence Living Arrangements: Spouse/significant other;Children Available Help at Discharge: Family;Available PRN/intermittently Type of Home: House Home Access: Stairs to enter Entrance Stairs-Rails: Right;Left;Can reach both Entrance Stairs-Number of Steps: 8 in the front, 2 in the back Home Layout: One level Home Equipment: Shower seat;Cane - single point;Walker - 4 wheels;Bedside commode      Prior Function Level of Independence: Needs assistance   Gait / Transfers Assistance Needed: Pt reports she has been needing assistance for ADL's since return home from rehab           Hand Dominance   Dominant Hand: Right  Extremity/Trunk Assessment   Upper Extremity Assessment Upper Extremity Assessment: Defer to OT evaluation    Lower Extremity Assessment Lower Extremity Assessment: Generalized weakness    Cervical / Trunk Assessment Cervical / Trunk Assessment: Other exceptions Cervical / Trunk Exceptions: Forward head posture and rounded shoulders  Communication   Communication: No difficulties  Cognition  Arousal/Alertness: Awake/alert Behavior During Therapy: WFL for tasks assessed/performed Overall Cognitive Status: Within Functional Limits for tasks assessed                                        General Comments      Exercises     Assessment/Plan    PT Assessment Patient needs continued PT services  PT Problem List Decreased strength;Decreased range of motion;Decreased activity tolerance;Decreased balance;Decreased mobility;Decreased knowledge of use of DME;Decreased safety awareness;Decreased knowledge of precautions       PT Treatment Interventions DME instruction;Gait training;Stair training;Functional mobility training;Therapeutic activities;Therapeutic exercise;Neuromuscular re-education;Patient/family education    PT Goals (Current goals can be found in the Care Plan section)  Acute Rehab PT Goals Patient Stated Goal: Home at d/c PT Goal Formulation: With patient Time For Goal Achievement: 09/16/17 Potential to Achieve Goals: Good    Frequency Min 3X/week   Barriers to discharge        Co-evaluation               AM-PAC PT "6 Clicks" Daily Activity  Outcome Measure Difficulty turning over in bed (including adjusting bedclothes, sheets and blankets)?: A Little Difficulty moving from lying on back to sitting on the side of the bed? : A Little Difficulty sitting down on and standing up from a chair with arms (e.g., wheelchair, bedside commode, etc,.)?: A Little Help needed moving to and from a bed to chair (including a wheelchair)?: A Little Help needed walking in hospital room?: A Little Help needed climbing 3-5 steps with a railing? : A Little 6 Click Score: 18    End of Session Equipment Utilized During Treatment: Gait belt Activity Tolerance: Patient tolerated treatment well Patient left: in chair;with call bell/phone within reach Nurse Communication: Mobility status PT Visit Diagnosis: Unsteadiness on feet (R26.81);Difficulty in  walking, not elsewhere classified (R26.2)    Time: [id number] PT Time Calculation (min) (ACUTE ONLY): 18 min   Charges:   PT Evaluation $PT Eval Moderate Complexity: 1 Mod     PT G Codes:        Conni SlipperLaura Corneilus Heggie, PT, DPT Acute Rehabilitation Services Pager: [phone]   Marylynn PearsonLaura D Deakin Lacek 09/02/2017, 3:42 PM

## 2017-09-02 NOTE — Progress Notes (Addendum)
Initial Nutrition Assessment  DOCUMENTATION CODES:   Non-severe (moderate) malnutrition in context of chronic illness  INTERVENTION:   Premier Protein BID, each supplement provides 160 kcal and 30 grams of protein.   MVI daily  Dysphagia 2 diet   Recommend vitamin C 568m daily   Pt at refeeding risk; recommend monitor K, P, and Mg   NUTRITION DIAGNOSIS:   Moderate Malnutrition related to catabolic illness(uncontrolled DM, h/o renal transplant) as evidenced by mild to moderate fat depletions, moderate muscle depletions.  GOAL:   Patient will meet greater than or equal to 90% of their needs  MONITOR:   PO intake, Supplement acceptance, Labs, Weight trends, I & O's, Skin  REASON FOR ASSESSMENT:   Malnutrition Screening Tool    ASSESSMENT:   51years old Caucasian female with a past medical history significant for kidney transplant on steroids, hypertension, diabetes type 1, hyperlipidemia, and recent discharge from rehab after she had right hip fracture and ORIF; discharged a week ago and has been feeling sick with cough, shortness of breath, fatigue and severe hyperglycemia. Pt found to have severe DKA.  Met with pt in room today. Pt reports good appetite but chronic poor oral intake related to poor dentition. Pt reports that she has trouble eating foods at home because her teeth are painful and falling out. Pt reports that she needs ground foods; RD will change pt to dysphagia 2 diet. Pt is currently eating 50% of meals. Pt has mainly just been eating the soft foods off of her plate. Per chart, pt is weight stable and pt reports her UBW is ~180lbs. Pt noted to have muscle and fat depletions; RD suspects this is related to her poor oral intake and uncontrolled diabetes. RD will add Premier Protein to help pt meet her estimated protein needs. Pt noted to have bruising over her entire body; recommend vitamin C supplementation. Pt at refeeding risk; recommend monitor K, P, and Mg  as needed per MD discretion.   Medications reviewed and include: calcitriol, ceftin, lovenox, insulin, cellcept, KCl, prednisone, Na bicarb, hydrocodone    Labs reviewed: K 2.9(L), BUN 35(H), creat 2.12(H), Ca 8.4(L) adj. 10.08 wnl, P 3.8 wnl, alb 1.9(L) Mg 1.2(L)- 12/26 Hgb 9.4(L), Hct 29.4(L) cbgs- 226, 227, 124 x 48 hrs AIC- 9.4(H)- 04/2017 PTH- 253(H)- 12/26  Nutrition-Focused physical exam completed. Findings are mild fat depletions in orbital regions and chest, moderate fat depletions in buccal regions, moderate muscle depletions in temporal regions, clavicles, shoulders, and BLE, and no edema. Pt noted to have bruising on arms and legs.   Diet Order:  DIET DYS 2 Room service appropriate? Yes; Fluid consistency: Thin  EDUCATION NEEDS:   Education needs have been addressed  Skin:  Reviewed RN Assessment  Last BM:  12/27- type 6  Height:   Ht Readings from Last 1 Encounters:  08/31/17 '5\' 8"'  (1.727 m)    Weight:   Wt Readings from Last 1 Encounters:  08/31/17 179 lb 7.3 oz (81.4 kg)    Ideal Body Weight:  63.6 kg  BMI:  Body mass index is 27.29 kg/m.  Estimated Nutritional Needs:   Kcal:  1800-2100kcal/day   Protein:  81-98g/day   Fluid:  >1.8L/day or per MD  CKoleen DistanceMS, RD, LDN Pager #[phone] Hours Pager: [phone]

## 2017-09-03 LAB — RENAL FUNCTION PANEL
Albumin: 2.1 g/dL — ABNORMAL LOW (ref 3.5–5.0)
Anion gap: 11 (ref 5–15)
BUN: 22 mg/dL — ABNORMAL HIGH (ref 6–20)
CALCIUM: 8.9 mg/dL (ref 8.9–10.3)
CHLORIDE: 106 mmol/L (ref 101–111)
CO2: 20 mmol/L — AB (ref 22–32)
CREATININE: 1.53 mg/dL — AB (ref 0.44–1.00)
GFR calc Af Amer: 45 mL/min — ABNORMAL LOW (ref >60)
GFR calc non Af Amer: 39 mL/min — ABNORMAL LOW (ref >60)
GLUCOSE: 329 mg/dL — AB (ref 65–99)
Phosphorus: 1.9 mg/dL — ABNORMAL LOW (ref 2.5–4.6)
Potassium: 4.6 mmol/L (ref 3.5–5.1)
SODIUM: 137 mmol/L (ref 135–145)

## 2017-09-03 LAB — GLUCOSE, CAPILLARY
GLUCOSE-CAPILLARY: 272 mg/dL — AB (ref 65–99)
GLUCOSE-CAPILLARY: 297 mg/dL — AB (ref 65–99)
GLUCOSE-CAPILLARY: 269 mg/dL — AB (ref 65–99)
Glucose-Capillary: 327 mg/dL — ABNORMAL HIGH (ref 65–99)

## 2017-09-03 NOTE — Progress Notes (Addendum)
PROGRESS NOTE    [name]  ZOX:[id number] DOB: 1966/04/24 DOA: 08/31/2017 PCP: Dois Davenportichter, Karen L, MD    Brief Narrative:  Generalized weakness and shortness of breath due to DKA precipitated by left lower lobe community-acquired pneumonia.  Assessment & Plan:   Active Problems:    Type 1 diabetes mellitus with diabetic foot ulcer (HCC) - Continue current insulin regimen pt on Novolog 70/30  - dysphagia 2 diet.  pna - continue current antibiotic regimen.     Acute renal failure superimposed on stage 3 chronic kidney disease [facility](HCC) - Nephrology on board and managing.    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 Nephrology on board.  DVT prophylaxis: Lovenox Code Status: Full Family Communication: none at bedside. Disposition Plan: (addendum: pending improvement in condition)   Consultants:   Nephrology   Procedures: none   Antimicrobials: Ceftin   Subjective: Pt has no new complaints  Objective: Vitals:   09/02/17 1830 09/02/17 2053 09/03/17 0448 09/03/17 0848  BP: (!) 171/90 (!) 158/69 (!) 132/58 (!) 178/76  Pulse: 78 82 73 73  Resp: 18 19 20 18   Temp: 98 F (36.7 C) 98.6 F (37 C) 98.5 F (36.9 C) 98.6 F (37 C)  TempSrc: Oral Oral Oral Oral  SpO2: 98% 99% 100% 98%  Weight:  81.6 kg (179 lb 14.3 oz)    Height:        Intake/Output Summary (Last 24 hours) at 09/03/2017 1739 Last data filed at 09/03/2017 0850 Gross per 24 hour  Intake 360 ml  Output 0 ml  Net 360 ml   Filed Weights   08/31/17 1530 08/31/17 2200 09/02/17 2053  Weight: 81.6 kg (180 lb) 81.4 kg (179 lb 7.3 oz) 81.6 kg (179 lb 14.3 oz)    Examination:  General exam: Appears calm and comfortable, in NAD Respiratory system: Clear to auscultation. Respiratory effort normal. Cardiovascular system: S1 & S2 heard, RRR. No JVD, murmurs, rubs, gallops or  clicks. No pedal edema. Gastrointestinal system: Abdomen is nondistended, soft and nontender. No organomegaly or masses felt. Normal bowel sounds heard. Central nervous system: Alert and oriented. No focal neurological deficits. Extremities: warm, no deformities  Skin: No rashes, lesions or ulcers, on limited exam. Psychiatry: Mood & affect appropriate.   Data Reviewed: I have personally reviewed following labs and imaging studies  CBC: Recent Labs  Lab 08/31/17 1553 08/31/17 1610 08/31/17 2004 09/01/17 0255 09/02/17 0301  WBC 12.8*  --  7.3 6.5 4.0  NEUTROABS 11.5*  --   --  5.7 3.4  HGB 9.0* 10.2* 8.5* 8.8* 9.4*  HCT 32.2* 30.0* 28.5* 27.5* 29.4*  MCV 100.3*  --  96.0 87.3 87.5  PLT 220  --  174 157 120*   Basic Metabolic Panel: Recent Labs  Lab 08/31/17 2334 09/01/17 0255 09/01/17 1316 09/02/17 0301 09/03/17 0812  NA 133* 135 136 139 137  K 4.2 3.6 3.6 2.9* 4.6  CL 104 106 106 107 106  CO2 10* 18* 17* 23 20*  GLUCOSE 375* 226* 227* 124* 329*  BUN 50* 50* 44* 35* 22*  CREATININE 3.04* 2.83* 2.59* 2.12* 1.53*  CALCIUM 8.0* 7.7* 8.1* 8.4* 8.9  MG  --  1.2*  --   --   --   PHOS  --   --   --  3.8 1.9*   GFR: Estimated Creatinine Clearance: 49.3 mL/min (A) (by C-G formula based on  SCr of 1.53 mg/dL (H)). Liver Function Tests: Recent Labs  Lab 08/31/17 1553 09/01/17 0255 09/02/17 0301 09/03/17 0812  AST 17 27  --   --   ALT 14 17  --   --   ALKPHOS 106 85  --   --   BILITOT 2.2* 0.8  --   --   PROT 5.8* 5.1*  --   --   ALBUMIN 2.7* 2.2* 1.9* 2.1*   No results for input(s): LIPASE, AMYLASE in the last 168 hours. No results for input(s): AMMONIA in the last 168 hours. Coagulation Profile: Recent Labs  Lab 09/01/17 0255  INR 1.16   Cardiac Enzymes: No results for input(s): CKTOTAL, CKMB, CKMBINDEX, TROPONINI in the last 168 hours. BNP (last 3 results) No results for input(s): PROBNP in the last 8760 hours. HbA1C: No results for input(s): HGBA1C in the  last 72 hours. CBG: Recent Labs  Lab 09/02/17 1722 09/02/17 2033 09/03/17 0739 09/03/17 1141 09/03/17 1656  GLUCAP 100* 186* 272* 297* 327*   Lipid Profile: No results for input(s): CHOL, HDL, LDLCALC, TRIG, CHOLHDL, LDLDIRECT in the last 72 hours. Thyroid Function Tests: No results for input(s): TSH, T4TOTAL, FREET4, T3FREE, THYROIDAB in the last 72 hours. Anemia Panel: Recent Labs    09/01/17 1617  TIBC 217*  IRON 12*   Sepsis Labs: Recent Labs  Lab 08/31/17 1611 08/31/17 1825  LATICACIDVEN 2.19* 2.03*    Recent Results (from the past 240 hour(s))  Blood culture (routine x 2)     Status: Abnormal   Collection Time: 08/31/17  3:54 PM  Result Value Ref Range Status   Specimen Description BLOOD BLOOD LEFT FOREARM  Final   Special Requests   Final    IN BOTH AEROBIC AND ANAEROBIC BOTTLES Blood Culture adequate volume   Culture  Setup Time   Final    GRAM POSITIVE COCCI IN BOTH AEROBIC AND ANAEROBIC BOTTLES CRITICAL RESULT CALLED TO, READ BACK BY AND VERIFIED WITH: N. Batchelder Pharm.D. 12:20 09/01/17 ([email or web address])    Culture (A)  Final    STAPHYLOCOCCUS SPECIES (COAGULASE NEGATIVE) THE SIGNIFICANCE OF ISOLATING THIS ORGANISM FROM A SINGLE SET OF BLOOD CULTURES WHEN MULTIPLE SETS ARE DRAWN IS UNCERTAIN. PLEASE NOTIFY THE MICROBIOLOGY DEPARTMENT WITHIN ONE WEEK IF SPECIATION AND SENSITIVITIES ARE REQUIRED.    Report Status 09/02/2017 FINAL  Final  Blood Culture ID Panel (Reflexed)     Status: Abnormal   Collection Time: 08/31/17  3:54 PM  Result Value Ref Range Status   Enterococcus species NOT DETECTED NOT DETECTED Final   Listeria monocytogenes NOT DETECTED NOT DETECTED Final   Staphylococcus species DETECTED (A) NOT DETECTED Final    Comment: Methicillin (oxacillin) susceptible coagulase negative staphylococcus. Possible blood culture contaminant (unless isolated from more than one blood culture draw or clinical case suggests pathogenicity). No antibiotic treatment is  indicated for blood  culture contaminants. CRITICAL RESULT CALLED TO, READ BACK BY AND VERIFIED WITH: N. Batchelder Pharm.D. 12:20 09/01/17 ([email or web address])    Staphylococcus aureus NOT DETECTED NOT DETECTED Final   Methicillin resistance NOT DETECTED NOT DETECTED Final   Streptococcus species NOT DETECTED NOT DETECTED Final   Streptococcus agalactiae NOT DETECTED NOT DETECTED Final   Streptococcus pneumoniae NOT DETECTED NOT DETECTED Final   Streptococcus pyogenes NOT DETECTED NOT DETECTED Final   Acinetobacter baumannii NOT DETECTED NOT DETECTED Final   Enterobacteriaceae species NOT DETECTED NOT DETECTED Final   Enterobacter cloacae complex NOT DETECTED NOT DETECTED Final   Escherichia coli NOT DETECTED NOT  DETECTED Final   Klebsiella oxytoca NOT DETECTED NOT DETECTED Final   Klebsiella pneumoniae NOT DETECTED NOT DETECTED Final   Proteus species NOT DETECTED NOT DETECTED Final   Serratia marcescens NOT DETECTED NOT DETECTED Final   Haemophilus influenzae NOT DETECTED NOT DETECTED Final   Neisseria meningitidis NOT DETECTED NOT DETECTED Final   Pseudomonas aeruginosa NOT DETECTED NOT DETECTED Final   Candida albicans NOT DETECTED NOT DETECTED Final   Candida glabrata NOT DETECTED NOT DETECTED Final   Candida krusei NOT DETECTED NOT DETECTED Final   Candida parapsilosis NOT DETECTED NOT DETECTED Final   Candida tropicalis NOT DETECTED NOT DETECTED Final  Blood culture (routine x 2)     Status: None (Preliminary result)   Collection Time: 08/31/17  4:21 PM  Result Value Ref Range Status   Specimen Description BLOOD RIGHT HAND  Final   Special Requests   Final    IN BOTH AEROBIC AND ANAEROBIC BOTTLES Blood Culture adequate volume   Culture NO GROWTH 3 DAYS  Final   Report Status PENDING  Incomplete  C difficile quick scan w PCR reflex     Status: None   Collection Time: 08/31/17  9:20 PM  Result Value Ref Range Status   C Diff antigen NEGATIVE NEGATIVE Final   C Diff toxin NEGATIVE  NEGATIVE Final   C Diff interpretation No C. difficile detected.  Final  MRSA PCR Screening     Status: Abnormal   Collection Time: 08/31/17 11:18 PM  Result Value Ref Range Status   MRSA by PCR POSITIVE (A) NEGATIVE Final    Comment:        The GeneXpert MRSA Assay (FDA approved for NASAL specimens only), is one component of a comprehensive MRSA colonization surveillance program. It is not intended to diagnose MRSA infection nor to guide or monitor treatment for MRSA infections. RESULT CALLED TO, READ BACK BY AND VERIFIED WITHBrent General RN [phone] 09/01/17 A BROWNING      Radiology Studies: No results found.   Scheduled Meds: . amLODipine  10 mg Oral QHS  . calcitRIOL  0.25 mcg Oral Daily  . carvedilol  12.5 mg Oral BID WC  . cefUROXime  500 mg Oral BID WC  . enoxaparin  40 mg Subcutaneous Q24H  . hydrALAZINE  25 mg Oral Q12H  . insulin aspart protamine- aspart  12 Units Subcutaneous BID WC  . levothyroxine  137 mcg Oral QAC breakfast  . multivitamin with minerals  1 tablet Oral Daily  . mupirocin ointment  1 application Nasal BID  . mycophenolate  500 mg Oral BID  . predniSONE  5 mg Oral Q breakfast  . saccharomyces boulardii  250 mg Oral BID  . sodium bicarbonate  1,300 mg Oral BID  . tacrolimus  0.5 mg Oral QHS  . tacrolimus  1 mg Oral QAC breakfast   Continuous Infusions:   LOS: 3 days    Time spent: > 35 minutes  Penny Pia, MD [facility] Pager [phone]  If 7PM-7AM, please contact night-coverage [email or web address] Password [facility] 09/03/2017, 5:39 PM

## 2017-09-03 NOTE — Progress Notes (Signed)
Inpatient Diabetes Program Recommendations  AACE/ADA: New Consensus Statement on Inpatient Glycemic Control (2015)  Target Ranges:  Prepandial:   less than 140 mg/dL      Peak postprandial:   less than 180 mg/dL (1-2 hours)      Critically ill patients:  140 - 180 mg/dL   Lab Results  Component Value Date   GLUCAP 297 (H) 09/03/2017   HGBA1C 9.4 (H) 04/25/2017    Review of Glycemic Control Results for [name], [name] (MRN [id number]) as of 09/03/2017 13:54  Ref. Range 09/02/2017 14:04 09/02/2017 17:22 09/02/2017 20:33 09/03/2017 07:39 09/03/2017 11:41  Glucose-Capillary Latest Ref Range: 65 - 99 mg/dL 61 (L) 782100 (H) 956186 (H) 272 (H) 297 (H)   Diabetes history: DM1 Outpatient Diabetes medications: Novolin 70/30 insulin mix 20 units ac meals bid Current orders for Inpatient glycemic control: Novolog 70/30 mix 12 units bid  Inpatient Diabetes Program Recommendations:   Noted decrease in 70/30 insulin mix post hypoglycemia. CBGs today 272 & 297. -Increase Novolog 70/30 insulin to 15 units bid  Will follow.  Thank you, Billy FischerJudy E. Shambhavi Salley, RN, MSN, CDE  Diabetes Coordinator Inpatient Glycemic Control Team Team Pager [phone] (8am-5pm) 09/03/2017 1:56 PM

## 2017-09-03 NOTE — Progress Notes (Signed)
Physical Therapy Treatment Patient Details Name: [name] MRN: [id number] DOB: [birth year] Today's Date: 09/03/2017    History of Present Illness Pt is a 51 y/o female with a PMH significant for Long-standing type 1 diabetes with poor control, hypertension, depression. 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 She presents with generalized weakness and shortness of breath due to DKA precipitated by left lower lobe community-acquired pneumonia. Pt with recent ORIF of femoral fx and prolonged rehab stay, recently discharged.    PT Comments    Pt making great progress with mobility and tolerated ambulation this session with use of RW. PT updated d/c recommendations as pt declining HHPT with preference for OPPT. Pt reported that she would have reliable transportation. Pt would continue to benefit from skilled physical therapy services at this time while admitted and after d/c to address the below listed limitations in order to improve overall safety and independence with functional mobility.    Follow Up Recommendations  Outpatient PT;Supervision for mobility/OOB(pt refusing HHPT but agreeable to OP)     Equipment Recommendations  None recommended by PT    Recommendations for Other Services       Precautions / Restrictions Precautions Precautions: Fall Restrictions Weight Bearing Restrictions: No    Mobility  Bed Mobility Overal bed mobility: Modified Independent                Transfers Overall transfer level: Needs assistance Equipment used: Rolling walker (2 wheeled);None Transfers: Sit to/from Stand Sit to Stand: Min guard;Mod assist         General transfer comment: min guard from sitting EOB, mod A from low toilet seat  Ambulation/Gait Ambulation/Gait assistance: Min guard;Min assist Ambulation Distance (Feet): 150 Feet Assistive device: Rolling walker (2 wheeled) Gait Pattern/deviations: Step-through pattern;Decreased stride length;Drifts right/left Gait  velocity: decreased Gait velocity interpretation: Below normal speed for age/gender General Gait Details: mild instability with LOB x2 requiring min A to maintain upright position   Stairs            Wheelchair Mobility    Modified Rankin (Stroke Patients Only)       Balance Overall balance assessment: Needs assistance Sitting-balance support: Feet supported;No upper extremity supported Sitting balance-Leahy Scale: Good     Standing balance support: During functional activity;Single extremity supported Standing balance-Leahy Scale: Poor                              Cognition Arousal/Alertness: Awake/alert Behavior During Therapy: WFL for tasks assessed/performed Overall Cognitive Status: Within Functional Limits for tasks assessed                                        Exercises      General Comments        Pertinent Vitals/Pain Pain Assessment: No/denies pain    Home Living                      Prior Function            PT Goals (current goals can now be found in the care plan section) Acute Rehab PT Goals PT Goal Formulation: With patient Time For Goal Achievement: 09/16/17 Potential to Achieve Goals: Good Progress towards PT goals: Progressing toward goals    Frequency    Min 3X/week      PT Plan Discharge plan needs  to be updated    Co-evaluation              AM-PAC PT "6 Clicks" Daily Activity  Outcome Measure  Difficulty turning over in bed (including adjusting bedclothes, sheets and blankets)?: None Difficulty moving from lying on back to sitting on the side of the bed? : None Difficulty sitting down on and standing up from a chair with arms (e.g., wheelchair, bedside commode, etc,.)?: Unable Help needed moving to and from a bed to chair (including a wheelchair)?: A Little Help needed walking in hospital room?: A Little Help needed climbing 3-5 steps with a railing? : A Little 6 Click  Score: 18    End of Session Equipment Utilized During Treatment: Gait belt Activity Tolerance: Patient tolerated treatment well Patient left: in bed;with call bell/phone within reach;with bed alarm set Nurse Communication: Mobility status PT Visit Diagnosis: Unsteadiness on feet (R26.81);Difficulty in walking, not elsewhere classified (R26.2)     Time: [id number] PT Time Calculation (min) (ACUTE ONLY): 11 min  Charges:  $Gait Training: 8-22 mins                    G Codes:       [place]burgJennifer Jmari Pelc, [place]PT, [place]PT [phone]    Alessandra BevelsJennifer M Natanael Saladin 09/03/2017, 9:18 AM

## 2017-09-03 NOTE — Progress Notes (Signed)
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  She is diuresing appropriately without diuretics and her volume status appears euvolemic.  Her baseline Cr is quite variable and ranges from 1.5-2.4.  She is on her home immunosuppression regimen.  There is no contraindication to d/c from renal perspective today.  She has followup with Dr. Arrie Aranoladonato 09/14/2017, check in at 1:45 for a 2:00pm appointment. 2 HTN: on hydralazine, coreg, and amlodipine 3 DM: multiple admissions for DKA 4 Obesity 5 Gram pos sepsis- blood cultures positive for CONS, on oral ceftin 6. Dispo: from renal perspective can d/c today      Subjective:    Pt tells me she is for dc today.     Objective:   BP (!) 178/76 (BP Location: Left Arm)   Pulse 73   Temp 98.6 F (37 C) (Oral)   Resp 18   Ht 5\' 8"  (1.727 m)   Wt 81.6 kg (179 lb 14.3 oz)   LMP 11/19/2011   SpO2 98%   BMI 27.35 kg/m   Intake/Output Summary (Last 24 hours) at 09/03/2017 1124 Last data filed at 09/03/2017 0850 Gross per 24 hour  Intake 420 ml  Output 360 ml  Net 60 ml   Weight change:   Physical Exam: ZOX:[id number]:Chronically ill-appearing, NAD NECK: flat neck veins CVS:RRR soft systolic murmur Resp: some faint L crackles Abd: Allograft in RLQ without tenderness Ext:no LE edema Skin: mult tattoos  Imaging: No results found.  Labs: BMET Recent Labs  Lab 08/31/17 1553 08/31/17 1610 08/31/17 2004 08/31/17 2334 09/01/17 0255 09/01/17 1316 09/02/17 0301 09/03/17 0812  NA 128* 128* 133* 133* 135 136 139 137  K 6.5* 6.5* 4.6 4.2 3.6 3.6 2.9* 4.6  CL 97* 101 104 104 106 106 107 106  CO2 <7*  --  <7* 10* 18* 17* 23 20*  GLUCOSE 842* >700* 612* 375* 226* 227* 124* 329*  BUN 51* 43* 50* 50* 50* 44* 35* 22*  CREATININE 3.36* 2.50* 3.23* 3.04* 2.83* 2.59* 2.12* 1.53*  CALCIUM 8.6*  --  7.8* 8.0* 7.7* 8.1* 8.4* 8.9  PHOS  --   --   --   --   --    --  3.8 1.9*   CBC Recent Labs  Lab 08/31/17 1553 08/31/17 1610 08/31/17 2004 09/01/17 0255 09/02/17 0301  WBC 12.8*  --  7.3 6.5 4.0  NEUTROABS 11.5*  --   --  5.7 3.4  HGB 9.0* 10.2* 8.5* 8.8* 9.4*  HCT 32.2* 30.0* 28.5* 27.5* 29.4*  MCV 100.3*  --  96.0 87.3 87.5  PLT 220  --  174 157 120*    Medications:    . amLODipine  10 mg Oral QHS  . calcitRIOL  0.25 mcg Oral Daily  . carvedilol  12.5 mg Oral BID WC  . cefUROXime  500 mg Oral BID WC  . enoxaparin  40 mg Subcutaneous Q24H  . hydrALAZINE  25 mg Oral Q12H  . insulin aspart protamine- aspart  12 Units Subcutaneous BID WC  . levothyroxine  137 mcg Oral QAC breakfast  . multivitamin with minerals  1 tablet Oral Daily  . mupirocin ointment  1 application Nasal BID  . mycophenolate  500 mg Oral BID  . predniSONE  5 mg Oral Q breakfast  . saccharomyces boulardii  250 mg Oral BID  . sodium bicarbonate  1,300 mg Oral BID  .  tacrolimus  0.5 mg Oral QHS  . tacrolimus  1 mg Oral QAC breakfast      [name] ButtnerElizabeth Kandyce Dieguez, MD [name] pgr [phone] 09/03/2017, 11:24 AM

## 2017-09-04 LAB — RENAL FUNCTION PANEL
Albumin: 2.2 g/dL — ABNORMAL LOW (ref 3.5–5.0)
Anion gap: 9 (ref 5–15)
BUN: 13 mg/dL (ref 6–20)
CHLORIDE: 100 mmol/L — AB (ref 101–111)
CO2: 26 mmol/L (ref 22–32)
CREATININE: 1.55 mg/dL — AB (ref 0.44–1.00)
Calcium: 9 mg/dL (ref 8.9–10.3)
GFR calc Af Amer: 44 mL/min — ABNORMAL LOW (ref >60)
GFR, EST NON AFRICAN AMERICAN: 38 mL/min — AB (ref >60)
GLUCOSE: 324 mg/dL — AB (ref 65–99)
POTASSIUM: 4.3 mmol/L (ref 3.5–5.1)
Phosphorus: 2.3 mg/dL — ABNORMAL LOW (ref 2.5–4.6)
Sodium: 135 mmol/L (ref 135–145)

## 2017-09-04 LAB — GLUCOSE, CAPILLARY
GLUCOSE-CAPILLARY: 340 mg/dL — AB (ref 65–99)
Glucose-Capillary: 235 mg/dL — ABNORMAL HIGH (ref 65–99)

## 2017-09-04 MED ORDER — CEFUROXIME AXETIL 500 MG PO TABS: 500.0000 mg | ORAL_TABLET | Freq: Two times a day (BID) | ORAL | 0 refills | Status: AC

## 2017-09-04 MED ORDER — SODIUM BICARBONATE 650 MG PO TABS: 1300.0000 mg | ORAL_TABLET | Freq: Two times a day (BID) | ORAL | 0 refills | Status: AC

## 2017-09-04 MED ORDER — TACROLIMUS 1 MG PO CAPS: 1.0000 mg | ORAL_CAPSULE | Freq: Every day | ORAL | 0 refills | Status: DC

## 2017-09-04 MED ORDER — HYDRALAZINE HCL 25 MG PO TABS: 25.0000 mg | ORAL_TABLET | Freq: Two times a day (BID) | ORAL | 0 refills | Status: DC

## 2017-09-04 MED ORDER — CARVEDILOL 25 MG PO TABS
25.0000 mg | ORAL_TABLET | Freq: Two times a day (BID) | ORAL | Status: DC
  Administered 2017-09-04: 25 mg via ORAL
  Filled 2017-09-04: qty 1

## 2017-09-04 NOTE — Care Management (Signed)
Pt discharged before CM discussed HH vs OP PT and resumption with [facility].  [facility].  Referral placed for ambulatory PT.  [facility].  Samantha Crimesdacia states she spoke with pt today pt has refused all HH.  Samantha Crimesdacia states will try to reconnect with pt later this week.

## 2017-09-04 NOTE — Discharge Summary (Signed)
Physician Discharge Summary  [name] YQI:[id number] DOB: [birth year] DOA: 08/31/2017  PCP: Dois Davenport, MD  Admit date: 08/31/2017 Discharge date: 09/04/2017  Time spent: > 35 minutes  Recommendations for Outpatient Follow-up:  1. Ensure patient f/u with nephrology as outpatient.   Discharge Diagnoses:  Active Problems:   Immunosuppression (HCC)   AKI (acute kidney injury) (HCC)   Type 1 diabetes mellitus with diabetic foot ulcer (HCC)   Acute renal failure superimposed on stage 3 chronic kidney disease (HCC)   Malnutrition of moderate degree   Discharge Condition: stable  Diet recommendation: dysphagia 2 diet  Filed Weights   08/31/17 2200 09/02/17 2053 09/03/17 2010  Weight: 81.4 kg (179 lb 7.3 oz) 81.6 kg (179 lb 14.3 oz) 81.5 kg (179 lb 10.8 oz)    History of present illness:  Generalized weakness and shortness of breath due to DKA precipitated by left lower lobe community-acquired pneumonia.  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  She is diuresing appropriately without diuretics and her volume status appears euvolemic.  Her baseline Cr is quite variable and ranges from 1.5-2.4.  She is on her home immunosuppression regimen.  There is no contraindication to d/c from renal perspective today.  She has followup with Dr. Arrie Aran 09/14/2017, check in at 1:45 for a 2:00pm appointment. 2 HTN: on hydralazine, coreg, and amlodipine; increase coreg to 25 BID as pressures still a little high 3 DM: multiple admissions for DKA 4 Obesity 5 Gram pos sepsis- blood cultures positive for CONS, on oral ceftin 6. Dispo: from renal perspective can d/c today  Pt has contaminant in Blood cultures obtained. D/c on 3 more days of antibiotics to complete a total of 7 days of treatment with antibiotics  Procedures:  None  Consultations:  Nephrology  Discharge  Exam: Vitals:   09/04/17 0441 09/04/17 0747  BP: (!) 168/77 (!) 180/78  Pulse: 72 71  Resp: 18 18  Temp: 97.8 F (36.6 C) 97.9 F (36.6 C)  SpO2: 95% 97%    General: Pt in nad, alert and awake Cardiovascular: rrr, no rubs Respiratory: no increased wob, no wheezes  Discharge Instructions   Discharge Instructions    Call MD for:  difficulty breathing, headache or visual disturbances   Complete by:  As directed    Call MD for:  temperature >100.4   Complete by:  As directed    Diet - low sodium heart healthy   Complete by:  As directed    Discharge instructions   Complete by:  As directed    Please follow-up with your nephrologist for further evaluation recommendations.   Increase activity slowly   Complete by:  As directed      Allergies as of 09/04/2017      Reactions   Latex Itching   Ace Inhibitors Cough   Adhesive [tape] Itching, Other (See Comments)   Please use paper tape   Lisinopril Cough      Medication List    STOP taking these medications   aspirin EC 81 MG tablet   doxycycline 100 MG tablet Commonly known as:  VIBRA-TABS   enoxaparin 40 MG/0.4ML injection Commonly known as:  LOVENOX   TYLENOL COLD/FLU SEVERE NIGHT PO     TAKE these medications   acetaminophen 325 MG tablet Commonly known as:  TYLENOL Take 2 tablets (650 mg total) by mouth every 6 (six) hours as needed  for mild pain, moderate pain, fever or headache.   amLODipine 10 MG tablet Commonly known as:  NORVASC Take 1 tablet (10 mg total) by mouth daily. What changed:  when to take this   carvedilol 25 MG tablet Commonly known as:  COREG Take 50 mg by mouth 2 (two) times daily.   cefUROXime 500 MG tablet Commonly known as:  CEFTIN Take 1 tablet (500 mg total) by mouth 2 (two) times daily with a meal for 3 days.   FREESTYLE LIBRE READER Devi 1 each by Does not apply route 4 (four) times daily.   FREESTYLE LIBRE SENSOR SYSTEM Misc 1 sensor every 10 days   hydrALAZINE 25  MG tablet Commonly known as:  APRESOLINE Take 1 tablet (25 mg total) by mouth every 12 (twelve) hours.   HYDROcodone-acetaminophen 5-325 MG tablet Commonly known as:  NORCO Take 0.5-1 tablets by mouth every 4 (four) hours as needed for moderate pain or severe pain.   mycophenolate 250 MG capsule Commonly known as:  CELLCEPT Take 500 mg by mouth 2 (two) times daily.   NOVOLIN 70/30 FLEXPEN RELION (70-30) 100 UNIT/ML PEN Generic drug:  Insulin Isophane & Regular Human Inject 20 Units into the skin 2 (two) times daily before a meal. PER SLIDING SCALE What changed:  Another medication with the same name was removed. Continue taking this medication, and follow the directions you see here.   predniSONE 5 MG tablet Commonly known as:  DELTASONE Take 5 mg by mouth daily.   PROGRAF 1 MG capsule Generic drug:  tacrolimus Take 1 mg by mouth every morning. What changed:  Another medication with the same name was changed. Make sure you understand how and when to take each.   tacrolimus 1 MG capsule Commonly known as:  PROGRAF Take 1 capsule (1 mg total) by mouth daily before breakfast. Start taking on:  09/05/2017 What changed:    medication strength  how much to take  when to take this   sodium bicarbonate 650 MG tablet Take 2 tablets (1,300 mg total) by mouth 2 (two) times daily.   SYNTHROID 137 MCG tablet Generic drug:  levothyroxine Take 137 mcg by mouth daily.   vitamin B-12 100 MCG tablet Commonly known as:  CYANOCOBALAMIN Take 1 tablet (100 mcg total) by mouth daily.      Allergies  Allergen Reactions  . Latex Itching  . Ace Inhibitors Cough  . Adhesive [Tape] Itching and Other (See Comments)    Please use paper tape  . Lisinopril Cough   Follow-up Information    [facility] Follow up.   Specialty:  Home Health Services Contact information: [address] [place] [address] [phone]            The results of significant  diagnostics from this hospitalization (including imaging, microbiology, ancillary and laboratory) are listed below for reference.    Significant Diagnostic Studies: Dg Chest Port 1 View  Result Date: 09/01/2017 CLINICAL DATA:  Shortness of breath, pneumonia, immunosuppressed dated, sepsis. EXAM: PORTABLE CHEST 1 VIEW COMPARISON:  Portable chest x-ray of August 31, 2017 FINDINGS: The lungs are adequately inflated. The interstitial markings remain increased but the alveolar opacities have improved bilaterally. There is density projecting in the right lateral costophrenic angle more conspicuous than on the previous study. There is calcification in the wall of the aortic arch. The trachea is midline. The bony thorax exhibits no acute abnormality. IMPRESSION: Interval clearing of alveolar pneumonia especially on  the left. Persistent mild interstitial prominence bilaterally. Electronically Signed   By: David  SwazilandJordan M.D.   On: 09/01/2017 07:27   Dg Chest Portable 1 View  Result Date: 08/31/2017 CLINICAL DATA:  Cough for 2 days EXAM: PORTABLE CHEST 1 VIEW COMPARISON:  None. FINDINGS: There is left upper lobe and left lower lobe airspace disease most concerning for pneumonia. There is no pleural effusion or pneumothorax. The heart and mediastinal contours are unremarkable. The osseous structures are unremarkable. IMPRESSION: Left upper lobe and left lower lobe pneumonia. Followup PA and lateral chest X-ray is recommended in 3-4 weeks following trial of antibiotic therapy to ensure resolution and exclude underlying malignancy. Electronically Signed   By: Elige KoHetal  Patel   On: 08/31/2017 16:47    Microbiology: Recent Results (from the past 240 hour(s))  Blood culture (routine x 2)     Status: Abnormal   Collection Time: 08/31/17  3:54 PM  Result Value Ref Range Status   Specimen Description BLOOD BLOOD LEFT FOREARM  Final   Special Requests   Final    IN BOTH AEROBIC AND ANAEROBIC BOTTLES Blood Culture  adequate volume   Culture  Setup Time   Final    GRAM POSITIVE COCCI IN BOTH AEROBIC AND ANAEROBIC BOTTLES CRITICAL RESULT CALLED TO, READ BACK BY AND VERIFIED WITH: N. Batchelder Pharm.D. 12:20 09/01/17 ([email or web address])    Culture (A)  Final    STAPHYLOCOCCUS SPECIES (COAGULASE NEGATIVE) THE SIGNIFICANCE OF ISOLATING THIS ORGANISM FROM A SINGLE SET OF BLOOD CULTURES WHEN MULTIPLE SETS ARE DRAWN IS UNCERTAIN. PLEASE NOTIFY THE MICROBIOLOGY DEPARTMENT WITHIN ONE WEEK IF SPECIATION AND SENSITIVITIES ARE REQUIRED.    Report Status 09/02/2017 FINAL  Final  Blood Culture ID Panel (Reflexed)     Status: Abnormal   Collection Time: 08/31/17  3:54 PM  Result Value Ref Range Status   Enterococcus species NOT DETECTED NOT DETECTED Final   Listeria monocytogenes NOT DETECTED NOT DETECTED Final   Staphylococcus species DETECTED (A) NOT DETECTED Final    Comment: Methicillin (oxacillin) susceptible coagulase negative staphylococcus. Possible blood culture contaminant (unless isolated from more than one blood culture draw or clinical case suggests pathogenicity). No antibiotic treatment is indicated for blood  culture contaminants. CRITICAL RESULT CALLED TO, READ BACK BY AND VERIFIED WITH: N. Batchelder Pharm.D. 12:20 09/01/17 ([email or web address])    Staphylococcus aureus NOT DETECTED NOT DETECTED Final   Methicillin resistance NOT DETECTED NOT DETECTED Final   Streptococcus species NOT DETECTED NOT DETECTED Final   Streptococcus agalactiae NOT DETECTED NOT DETECTED Final   Streptococcus pneumoniae NOT DETECTED NOT DETECTED Final   Streptococcus pyogenes NOT DETECTED NOT DETECTED Final   Acinetobacter baumannii NOT DETECTED NOT DETECTED Final   Enterobacteriaceae species NOT DETECTED NOT DETECTED Final   Enterobacter cloacae complex NOT DETECTED NOT DETECTED Final   Escherichia coli NOT DETECTED NOT DETECTED Final   Klebsiella oxytoca NOT DETECTED NOT DETECTED Final   Klebsiella pneumoniae NOT DETECTED NOT DETECTED  Final   Proteus species NOT DETECTED NOT DETECTED Final   Serratia marcescens NOT DETECTED NOT DETECTED Final   Haemophilus influenzae NOT DETECTED NOT DETECTED Final   Neisseria meningitidis NOT DETECTED NOT DETECTED Final   Pseudomonas aeruginosa NOT DETECTED NOT DETECTED Final   Candida albicans NOT DETECTED NOT DETECTED Final   Candida glabrata NOT DETECTED NOT DETECTED Final   Candida krusei NOT DETECTED NOT DETECTED Final   Candida parapsilosis NOT DETECTED NOT DETECTED Final   Candida tropicalis NOT DETECTED NOT DETECTED Final  Blood culture (  routine x 2)     Status: None (Preliminary result)   Collection Time: 08/31/17  4:21 PM  Result Value Ref Range Status   Specimen Description BLOOD RIGHT HAND  Final   Special Requests   Final    IN BOTH AEROBIC AND ANAEROBIC BOTTLES Blood Culture adequate volume   Culture NO GROWTH 4 DAYS  Final   Report Status PENDING  Incomplete  C difficile quick scan w PCR reflex     Status: None   Collection Time: 08/31/17  9:20 PM  Result Value Ref Range Status   C Diff antigen NEGATIVE NEGATIVE Final   C Diff toxin NEGATIVE NEGATIVE Final   C Diff interpretation No C. difficile detected.  Final  MRSA PCR Screening     Status: Abnormal   Collection Time: 08/31/17 11:18 PM  Result Value Ref Range Status   MRSA by PCR POSITIVE (A) NEGATIVE Final    Comment:        The GeneXpert MRSA Assay (FDA approved for NASAL specimens only), is one component of a comprehensive MRSA colonization surveillance program. It is not intended to diagnose MRSA infection nor to guide or monitor treatment for MRSA infections. RESULT CALLED TO, READ BACK BY AND VERIFIED WITH: B SHEPHERD RN 0251 09/01/17 A BROWNING      Labs: Basic Metabolic Panel: Recent Labs  Lab 09/01/17 0255 09/01/17 1316 09/02/17 0301 09/03/17 0812 09/04/17 0512  NA 135 136 139 137 135  K 3.6 3.6 2.9* 4.6 4.3  CL 106 106 107 106 100*  CO2 18* 17* 23 20* 26  GLUCOSE 226* 227* 124*  329* 324*  BUN 50* 44* 35* 22* 13  CREATININE 2.83* 2.59* 2.12* 1.53* 1.55*  CALCIUM 7.7* 8.1* 8.4* 8.9 9.0  MG 1.2*  --   --   --   --   PHOS  --   --  3.8 1.9* 2.3*   Liver Function Tests: Recent Labs  Lab 08/31/17 1553 09/01/17 0255 09/02/17 0301 09/03/17 0812 09/04/17 0512  AST 17 27  --   --   --   ALT 14 17  --   --   --   ALKPHOS 106 85  --   --   --   BILITOT 2.2* 0.8  --   --   --   PROT 5.8* 5.1*  --   --   --   ALBUMIN 2.7* 2.2* 1.9* 2.1* 2.2*   No results for input(s): LIPASE, AMYLASE in the last 168 hours. No results for input(s): AMMONIA in the last 168 hours. CBC: Recent Labs  Lab 08/31/17 1553 08/31/17 1610 08/31/17 2004 09/01/17 0255 09/02/17 0301  WBC 12.8*  --  7.3 6.5 4.0  NEUTROABS 11.5*  --   --  5.7 3.4  HGB 9.0* 10.2* 8.5* 8.8* 9.4*  HCT 32.2* 30.0* 28.5* 27.5* 29.4*  MCV 100.3*  --  96.0 87.3 87.5  PLT 220  --  174 157 120*   Cardiac Enzymes: No results for input(s): CKTOTAL, CKMB, CKMBINDEX, TROPONINI in the last 168 hours. BNP: BNP (last 3 results) Recent Labs    04/06/17 1941 05/09/17 0853 08/31/17 1535  BNP 137.1* 282.1* 1,022.8*    ProBNP (last 3 results) No results for input(s): PROBNP in the last 8760 hours.  CBG: Recent Labs  Lab 09/03/17 1141 09/03/17 1656 09/03/17 2118 09/04/17 0748 09/04/17 1203  GLUCAP 297* 327* 269* 340* 235*     Signed:  Penny PiaVEGA, Jalon Squier MD.  [facility] 09/04/2017, 2:09  PM

## 2017-09-04 NOTE — Progress Notes (Signed)
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  She is diuresing appropriately without diuretics and her volume status appears euvolemic.  Her baseline Cr is quite variable and ranges from 1.5-2.4.  She is on her home immunosuppression regimen.  There is no contraindication to d/c from renal perspective today.  She has followup with Dr. Arrie Aranoladonato 09/14/2017, check in at 1:45 for a 2:00pm appointment. 2 HTN: on hydralazine, coreg, and amlodipine; increase coreg to 25 BID as pressures still a little high 3 DM: multiple admissions for DKA 4 Obesity 5 Gram pos sepsis- blood cultures positive for CONS, on oral ceftin 6. Dispo: from renal perspective can d/c today      Subjective:    Feeling OK.  Ate entire breakfast.   Objective:   BP (!) 180/78 (BP Location: Left Arm)   Pulse 71   Temp 97.9 F (36.6 C) (Oral)   Resp 18   Ht 5\' 8"  (1.727 m)   Wt 81.5 kg (179 lb 10.8 oz)   LMP 11/19/2011   SpO2 97%   BMI 27.32 kg/m   Intake/Output Summary (Last 24 hours) at 09/04/2017 1037 Last data filed at 09/04/2017 0600 Gross per 24 hour  Intake 360 ml  Output 600 ml  Net -240 ml   Weight change: -0.1 kg (-3.5 oz)  Physical Exam: VWU:[id number]:Chronically ill-appearing, NAD NECK: flat neck veins CVS:RRR soft systolic murmur Resp: some faint L crackles, improving Abd: Allograft in RLQ without tenderness Ext:no LE edema Skin: mult tattoos  Imaging: No results found.  Labs: BMET Recent Labs  Lab 08/31/17 2004 08/31/17 2334 09/01/17 0255 09/01/17 1316 09/02/17 0301 09/03/17 0812 09/04/17 0512  NA 133* 133* 135 136 139 137 135  K 4.6 4.2 3.6 3.6 2.9* 4.6 4.3  CL 104 104 106 106 107 106 100*  CO2 <7* 10* 18* 17* 23 20* 26  GLUCOSE 612* 375* 226* 227* 124* 329* 324*  BUN 50* 50* 50* 44* 35* 22* 13  CREATININE 3.23* 3.04* 2.83* 2.59* 2.12* 1.53* 1.55*  CALCIUM 7.8* 8.0* 7.7* 8.1* 8.4* 8.9 9.0   PHOS  --   --   --   --  3.8 1.9* 2.3*   CBC Recent Labs  Lab 08/31/17 1553 08/31/17 1610 08/31/17 2004 09/01/17 0255 09/02/17 0301  WBC 12.8*  --  7.3 6.5 4.0  NEUTROABS 11.5*  --   --  5.7 3.4  HGB 9.0* 10.2* 8.5* 8.8* 9.4*  HCT 32.2* 30.0* 28.5* 27.5* 29.4*  MCV 100.3*  --  96.0 87.3 87.5  PLT 220  --  174 157 120*    Medications:    . amLODipine  10 mg Oral QHS  . calcitRIOL  0.25 mcg Oral Daily  . carvedilol  12.5 mg Oral BID WC  . cefUROXime  500 mg Oral BID WC  . enoxaparin  40 mg Subcutaneous Q24H  . hydrALAZINE  25 mg Oral Q12H  . insulin aspart protamine- aspart  12 Units Subcutaneous BID WC  . levothyroxine  137 mcg Oral QAC breakfast  . multivitamin with minerals  1 tablet Oral Daily  . mupirocin ointment  1 application Nasal BID  . mycophenolate  500 mg Oral BID  . predniSONE  5 mg Oral Q breakfast  . saccharomyces boulardii  250 mg Oral BID  . sodium bicarbonate  1,300 mg Oral BID  . tacrolimus  0.5 mg Oral QHS  .  tacrolimus  1 mg Oral QAC breakfast      Bufford ButtnerElizabeth Mazel Villela, MD [facility] pgr [phone] 09/04/2017, 10:37 AM

## 2017-09-05 LAB — CULTURE, BLOOD (ROUTINE X 2): Culture: NO GROWTH

## 2017-09-05 LAB — TACROLIMUS LEVEL: Tacrolimus (FK506) - LabCorp: 12.5 ng/mL (ref 2.0–20.0)

## 2017-09-13 ENCOUNTER — Ambulatory Visit: Payer: Medicare Other | Admitting: Endocrinology

## 2017-09-13 DIAGNOSIS — Z0289 Encounter for other administrative examinations: Secondary | ICD-10-CM

## 2017-11-04 ENCOUNTER — Encounter (HOSPITAL_COMMUNITY): Payer: Self-pay | Admitting: Emergency Medicine

## 2017-11-04 ENCOUNTER — Inpatient Hospital Stay (HOSPITAL_COMMUNITY)
Admission: EM | Admit: 2017-11-04 | Discharge: 2017-11-12 | DRG: 699 | Disposition: A | Discharge status: Home Health | Payer: Medicare Other | Attending: Internal Medicine | Admitting: Internal Medicine

## 2017-11-04 DIAGNOSIS — Z940 Kidney transplant status: Secondary | ICD-10-CM | POA: Diagnosis not present

## 2017-11-04 DIAGNOSIS — N183 Chronic kidney disease, stage 3 (moderate): Secondary | ICD-10-CM | POA: Diagnosis present

## 2017-11-04 DIAGNOSIS — L0390 Cellulitis, unspecified: Secondary | ICD-10-CM

## 2017-11-04 DIAGNOSIS — E10649 Type 1 diabetes mellitus with hypoglycemia without coma: Secondary | ICD-10-CM | POA: Diagnosis present

## 2017-11-04 DIAGNOSIS — E1065 Type 1 diabetes mellitus with hyperglycemia: Secondary | ICD-10-CM | POA: Diagnosis present

## 2017-11-04 DIAGNOSIS — E039 Hypothyroidism, unspecified: Secondary | ICD-10-CM | POA: Diagnosis present

## 2017-11-04 DIAGNOSIS — Z888 Allergy status to other drugs, medicaments and biological substances status: Secondary | ICD-10-CM

## 2017-11-04 DIAGNOSIS — N179 Acute kidney failure, unspecified: Secondary | ICD-10-CM | POA: Diagnosis present

## 2017-11-04 DIAGNOSIS — R600 Localized edema: Secondary | ICD-10-CM | POA: Diagnosis not present

## 2017-11-04 DIAGNOSIS — Z86718 Personal history of other venous thrombosis and embolism: Secondary | ICD-10-CM

## 2017-11-04 DIAGNOSIS — I10 Essential (primary) hypertension: Secondary | ICD-10-CM | POA: Diagnosis not present

## 2017-11-04 DIAGNOSIS — M7989 Other specified soft tissue disorders: Secondary | ICD-10-CM | POA: Diagnosis not present

## 2017-11-04 DIAGNOSIS — Z9841 Cataract extraction status, right eye: Secondary | ICD-10-CM

## 2017-11-04 DIAGNOSIS — R601 Generalized edema: Secondary | ICD-10-CM | POA: Diagnosis present

## 2017-11-04 DIAGNOSIS — I129 Hypertensive chronic kidney disease with stage 1 through stage 4 chronic kidney disease, or unspecified chronic kidney disease: Secondary | ICD-10-CM | POA: Diagnosis present

## 2017-11-04 DIAGNOSIS — Z89411 Acquired absence of right great toe: Secondary | ICD-10-CM

## 2017-11-04 DIAGNOSIS — Z79899 Other long term (current) drug therapy: Secondary | ICD-10-CM

## 2017-11-04 DIAGNOSIS — R739 Hyperglycemia, unspecified: Secondary | ICD-10-CM | POA: Diagnosis not present

## 2017-11-04 DIAGNOSIS — L03115 Cellulitis of right lower limb: Secondary | ICD-10-CM | POA: Diagnosis present

## 2017-11-04 DIAGNOSIS — D638 Anemia in other chronic diseases classified elsewhere: Secondary | ICD-10-CM | POA: Diagnosis present

## 2017-11-04 DIAGNOSIS — R627 Adult failure to thrive: Secondary | ICD-10-CM | POA: Diagnosis present

## 2017-11-04 DIAGNOSIS — D849 Immunodeficiency, unspecified: Secondary | ICD-10-CM | POA: Diagnosis present

## 2017-11-04 DIAGNOSIS — E10621 Type 1 diabetes mellitus with foot ulcer: Secondary | ICD-10-CM | POA: Diagnosis present

## 2017-11-04 DIAGNOSIS — Z89412 Acquired absence of left great toe: Secondary | ICD-10-CM

## 2017-11-04 DIAGNOSIS — L03116 Cellulitis of left lower limb: Secondary | ICD-10-CM | POA: Diagnosis present

## 2017-11-04 DIAGNOSIS — Z9842 Cataract extraction status, left eye: Secondary | ICD-10-CM

## 2017-11-04 DIAGNOSIS — R21 Rash and other nonspecific skin eruption: Secondary | ICD-10-CM | POA: Diagnosis not present

## 2017-11-04 DIAGNOSIS — L97509 Non-pressure chronic ulcer of other part of unspecified foot with unspecified severity: Secondary | ICD-10-CM | POA: Diagnosis not present

## 2017-11-04 DIAGNOSIS — D631 Anemia in chronic kidney disease: Secondary | ICD-10-CM | POA: Diagnosis present

## 2017-11-04 DIAGNOSIS — Z9981 Dependence on supplemental oxygen: Secondary | ICD-10-CM

## 2017-11-04 DIAGNOSIS — T8619 Other complication of kidney transplant: Secondary | ICD-10-CM | POA: Diagnosis not present

## 2017-11-04 DIAGNOSIS — Z79890 Hormone replacement therapy (postmenopausal): Secondary | ICD-10-CM

## 2017-11-04 DIAGNOSIS — Z794 Long term (current) use of insulin: Secondary | ICD-10-CM

## 2017-11-04 DIAGNOSIS — E876 Hypokalemia: Secondary | ICD-10-CM | POA: Diagnosis not present

## 2017-11-04 DIAGNOSIS — D899 Disorder involving the immune mechanism, unspecified: Secondary | ICD-10-CM | POA: Diagnosis present

## 2017-11-04 DIAGNOSIS — R0602 Shortness of breath: Secondary | ICD-10-CM

## 2017-11-04 DIAGNOSIS — Z91040 Latex allergy status: Secondary | ICD-10-CM

## 2017-11-04 DIAGNOSIS — J9611 Chronic respiratory failure with hypoxia: Secondary | ICD-10-CM | POA: Diagnosis present

## 2017-11-04 DIAGNOSIS — Z89421 Acquired absence of other right toe(s): Secondary | ICD-10-CM

## 2017-11-04 DIAGNOSIS — S72001A Fracture of unspecified part of neck of right femur, initial encounter for closed fracture: Secondary | ICD-10-CM

## 2017-11-04 DIAGNOSIS — IMO0002 Reserved for concepts with insufficient information to code with codable children: Secondary | ICD-10-CM | POA: Diagnosis present

## 2017-11-04 DIAGNOSIS — Y830 Surgical operation with transplant of whole organ as the cause of abnormal reaction of the patient, or of later complication, without mention of misadventure at the time of the procedure: Secondary | ICD-10-CM | POA: Diagnosis present

## 2017-11-04 DIAGNOSIS — Z87891 Personal history of nicotine dependence: Secondary | ICD-10-CM

## 2017-11-04 DIAGNOSIS — Z91048 Other nonmedicinal substance allergy status: Secondary | ICD-10-CM

## 2017-11-04 DIAGNOSIS — Z961 Presence of intraocular lens: Secondary | ICD-10-CM | POA: Diagnosis present

## 2017-11-04 LAB — COMPREHENSIVE METABOLIC PANEL
ALBUMIN: 2.1 g/dL — AB (ref 3.5–5.0)
ALT: 11 U/L — AB (ref 14–54)
AST: 20 U/L (ref 15–41)
Alkaline Phosphatase: 86 U/L (ref 38–126)
Anion gap: 11 (ref 5–15)
BUN: 50 mg/dL — AB (ref 6–20)
CHLORIDE: 105 mmol/L (ref 101–111)
CO2: 19 mmol/L — ABNORMAL LOW (ref 22–32)
Calcium: 8.3 mg/dL — ABNORMAL LOW (ref 8.9–10.3)
Creatinine, Ser: 2.63 mg/dL — ABNORMAL HIGH (ref 0.44–1.00)
GFR calc Af Amer: 23 mL/min — ABNORMAL LOW (ref >60)
GFR calc non Af Amer: 20 mL/min — ABNORMAL LOW (ref >60)
GLUCOSE: 363 mg/dL — AB (ref 65–99)
Potassium: 4.4 mmol/L (ref 3.5–5.1)
Sodium: 135 mmol/L (ref 135–145)
Total Bilirubin: 1.3 mg/dL — ABNORMAL HIGH (ref 0.3–1.2)
Total Protein: 6.1 g/dL — ABNORMAL LOW (ref 6.5–8.1)

## 2017-11-04 LAB — CBG MONITORING, ED
GLUCOSE-CAPILLARY: 326 mg/dL — AB (ref 65–99)
GLUCOSE-CAPILLARY: 383 mg/dL — AB (ref 65–99)

## 2017-11-04 LAB — URINALYSIS, ROUTINE W REFLEX MICROSCOPIC
BILIRUBIN URINE: NEGATIVE
Glucose, UA: >=500 mg/dL — AB
KETONES UR: 5 mg/dL — AB
LEUKOCYTES UA: NEGATIVE
Nitrite: NEGATIVE
PH: 5 (ref 5.0–8.0)
Protein, ur: >=300 mg/dL — AB
Specific Gravity, Urine: 1.016 (ref 1.005–1.030)

## 2017-11-04 LAB — CBC WITH DIFFERENTIAL/PLATELET
BASOS PCT: 0 %
Basophils Absolute: 0 10*3/uL (ref 0.0–0.1)
EOS PCT: 0 %
Eosinophils Absolute: 0 10*3/uL (ref 0.0–0.7)
HEMATOCRIT: 31.6 % — AB (ref 36.0–46.0)
Hemoglobin: 9.6 g/dL — ABNORMAL LOW (ref 12.0–15.0)
LYMPHS ABS: 0.4 10*3/uL — AB (ref 0.7–4.0)
Lymphocytes Relative: 6 %
MCH: 28 pg (ref 26.0–34.0)
MCHC: 30.4 g/dL (ref 30.0–36.0)
MCV: 92.1 fL (ref 78.0–100.0)
Monocytes Absolute: 0.4 10*3/uL (ref 0.1–1.0)
Monocytes Relative: 5 %
NEUTROS ABS: 6.2 10*3/uL (ref 1.7–7.7)
Neutrophils Relative %: 89 %
Platelets: 129 10*3/uL — ABNORMAL LOW (ref 150–400)
RBC: 3.43 MIL/uL — ABNORMAL LOW (ref 3.87–5.11)
RDW: 15 % (ref 11.5–15.5)
WBC: 7 10*3/uL (ref 4.0–10.5)

## 2017-11-04 LAB — I-STAT BETA HCG BLOOD, ED (MC, WL, AP ONLY): I-stat hCG, quantitative: 37.5 m[IU]/mL — ABNORMAL HIGH (ref <5)

## 2017-11-04 LAB — I-STAT CG4 LACTIC ACID, ED: LACTIC ACID, VENOUS: 1.53 mmol/L (ref 0.5–1.9)

## 2017-11-04 MED ORDER — HYDROMORPHONE HCL 1 MG/ML IJ SOLN
0.5000 mg | Freq: Once | INTRAMUSCULAR | Status: AC
  Administered 2017-11-04: 0.5 mg via INTRAVENOUS
  Filled 2017-11-04: qty 1

## 2017-11-04 MED ORDER — SODIUM CHLORIDE 0.9 % IV SOLN
1.0000 g | Freq: Once | INTRAVENOUS | Status: AC
  Administered 2017-11-04: 1 g via INTRAVENOUS
  Filled 2017-11-04: qty 10

## 2017-11-04 MED ORDER — SODIUM CHLORIDE 0.9 % IV BOLUS (SEPSIS)
500.0000 mL | Freq: Once | INTRAVENOUS | Status: AC
  Administered 2017-11-04: 500 mL via INTRAVENOUS

## 2017-11-04 MED ORDER — INSULIN ASPART 100 UNIT/ML ~~LOC~~ SOLN
4.0000 [IU] | Freq: Once | SUBCUTANEOUS | Status: AC
  Administered 2017-11-04: 4 [IU] via SUBCUTANEOUS
  Filled 2017-11-04: qty 1

## 2017-11-04 NOTE — ED Provider Notes (Signed)
[facility] TELE[facility]Y [facility] Provider Note   CSN: [id number] Arrival date & time: 11/04/17  1334     History   Chief Complaint Chief Complaint  Patient presents with  . Hyperglycemia  . Leg Pain    HPI [name] is a 52 y.o. female past medical history of CKD, type 2 diabetes, history of renal transplant, hypertension who presents for evaluation of bilateral lower extremity pain, swelling, redness.  Patient reports that this is been ongoing for the last several days.  Patient states that she always has chronic lower extremity edema that fluctuates.  Patient reports that over the last 3 days, if he is become more persistent.  Additionally, patient reports that she has noticed worsening redness and warmth to the anterior aspect of the bilateral lower extremities.  She states that she was started noticing some sores to the anterior aspect that began yesterday.  She does not recall any trauma, injury, fall.  Patient reports that the pain radiates both up her lower extremities.  She reports difficulty walking secondary to pain.  She has been taking Tylenol for symptoms with minimal improvement in pain.  Additionally, patient reports hyperglycemia.  She is insulin-dependent type 2 diabetic and states that she has been compliant with her insulin regimen.  Patient reports that over the last few days, her blood sugars have been high though she does not recall a specific number.  Patient denies any fevers, chest pain, difficulty breathing, abdominal pain, nausea/vomiting, dysuria, hematuria.   The history is provided by the patient.    Past Medical History:  Diagnosis Date  . Arthritis    "elbows, knees, legs, back" (09/24/2015)  . CKD (chronic kidney disease)   . Daily headache   . Depression    "years ago"  . End stage renal disease (HCC)    right arm AV graft, resolved post transplant  . Hypertension   . Hypothyroid   . 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 Kidney disease 2011  . Pneumonia ~ 2007?  [name] Type II diabetes mellitus (HCC)    Insulin dependant    Patient Active Problem List   Diagnosis Date Noted  . Localized swelling of lower extremity 11/05/2017  . Malnutrition of moderate degree 09/02/2017  . Pneumonia   . Status post kidney transplant 06/02/2017  . Closed displaced fracture of right femoral neck (HCC) 06/02/2017  . Femoral neck fracture (HCC) 06/02/2017  . Hyperglycemia 05/09/2017  . Lower extremity cellulitis 03/23/2017  . Type II diabetes mellitus (HCC) 03/23/2017  . Anemia 01/26/2017  . Abnormal LFTs 01/26/2017  . DKA (diabetic ketoacidoses) (HCC) 08/12/2016  . Hypoglycemia   . Wound cellulitis   . CKD (chronic kidney disease) stage 3, GFR 30-59 ml/min (HCC) 06/26/2016  . Cellulitis of right lower extremity   . QT prolongation 05/13/2016  . UTI (lower urinary tract infection)   . Hypomagnesemia   . Pressure ulcer 04/11/2016  . Acute encephalopathy 04/11/2016  . Altered mental status 04/10/2016  . Insulin dependent diabetes mellitus (HCC) 04/10/2016  . Diabetic ketoacidosis without coma associated with type 2 diabetes mellitus (HCC) 11/02/2015  . Uncontrolled type 1 diabetes mellitus with foot ulcer (HCC) 06/04/2015  . Foot ulcer, right (HCC)   . Diarrhea 06/02/2015  . Type 1 diabetes mellitus with diabetic foot ulcer (HCC) 06/02/2015  . Acute renal failure superimposed on stage 3 chronic kidney disease (HCC) 06/02/2015  . Diabetic foot infection (HCC) 06/01/2015  . Essential hypertension  06/01/2015  . GERD (gastroesophageal reflux disease) 06/01/2015  . AKI (acute kidney injury) (HCC) 01/02/2015  . H/O kidney transplant 08/04/2012  . Hyponatremia 09/22/2011  . Immunosuppression (HCC)   . Hypothyroidism     Past Surgical History:  Procedure Laterality Date  . AMPUTATION Left 05/11/2014   Procedure: AMPUTATION LEFT GREAT TOE;  Surgeon: Kathryne Hitchhristopher Y Blackman, MD;  Location:  [facility];  Service: Orthopedics;  Laterality: Left;  . AV FISTULA PLACEMENT Right    forearm  . BACK SURGERY    . CATARACT EXTRACTION W/ INTRAOCULAR LENS  IMPLANT, BILATERAL Bilateral   . CESAREAN SECTION  07/1999  . DG AV DIALYSIS GRAFT DECLOT OR    . HIP PINNING,CANNULATED Right 06/02/2017   Procedure: CANNULATED RIGHT HIP PINNING;  Surgeon: Beverely LowNorris, Steve, MD;  Location: [facility];  Service: Orthopedics;  Laterality: Right;  . INCISION AND DRAINAGE Right 06/02/2015   Procedure: INCISION AND DRAINAGE OF RIGHT 3rd RAY RESECTION;  Surgeon: Kathryne Hitchhristopher Y Blackman, MD;  Location: [facility];  Service: Orthopedics;  Laterality: Right;  . 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 LUMBAR DISC SURGERY  2001  . NEPHRECTOMY TRANSPLANTED ORGAN    . TOE AMPUTATION Right    1,2 & 3rd toes.  . TUBAL LIGATION  07/1999    OB History    Gravida Para Term Preterm AB Living             1   SAB TAB Ectopic Multiple Live Births                   Home Medications    Prior to Admission medications   Medication Sig Start Date End Date Taking? Authorizing Provider  acetaminophen (TYLENOL) 325 MG tablet Take 2 tablets (650 mg total) by mouth every 6 (six) hours as needed for mild pain, moderate pain, fever or headache. 05/15/16  Yes Hongalgi, Maximino GreenlandAnand D, MD  amLODipine (NORVASC) 10 MG tablet Take 1 tablet (10 mg total) by mouth daily. Patient taking differently: Take 10 mg by mouth at bedtime.  08/14/16  Yes Regalado, Belkys A, MD  carvedilol (COREG) 25 MG tablet Take 50 mg by mouth 2 (two) times daily. 02/26/17  Yes [provider]  hydrALAZINE (APRESOLINE) 25 MG tablet Take 1 tablet (25 mg total) by mouth every 12 (twelve) hours. 09/04/17  Yes Penny PiaVega, Orlando, MD  Insulin Isophane & Regular Human (NOVOLIN 70/30 FLEXPEN RELION) (70-30) 100 UNIT/ML PEN Inject 20 Units into the skin 2 (two) times daily before a meal. PER SLIDING SCALE   Yes [provider]  mycophenolate (CELLCEPT) 250 MG capsule Take 500  mg by mouth 2 (two) times daily.    Yes [provider]  predniSONE (DELTASONE) 5 MG tablet Take 5 mg by mouth daily.  05/29/15  Yes [provider]  sodium bicarbonate 650 MG tablet Take 2 tablets (1,300 mg total) by mouth 2 (two) times daily. 09/04/17  Yes Penny PiaVega, Orlando, MD  SYNTHROID 137 MCG tablet Take 137 mcg by mouth daily. 04/10/17  Yes [provider]  tacrolimus (PROGRAF) 1 MG capsule Take 1 capsule (1 mg total) by mouth daily before breakfast. Patient taking differently: Take 0.5-1 mg by mouth daily before breakfast. Take 0.5 mg by mouth each morning and 1 mg by mouth each evening 09/05/17  Yes Penny PiaVega, [place], MD  Continuous Blood Gluc Receiver (FREESTYLE LIBRE READER) DEVI 1 each by Does not apply route 4 (four) times daily. 04/29/17   Leatha GildingGherghe, Costin M, MD  Continuous Blood Gluc Sensor (FREESTYLE LIBRE SENSOR SYSTEM) MISC 1 sensor every 10 days 04/29/17   Leatha Gilding, MD  HYDROcodone-acetaminophen (NORCO) 5-325 MG tablet Take 0.5-1 tablets by mouth every 4 (four) hours as needed for moderate pain or severe pain. 06/02/17   Beverely Low, MD  vitamin B-12 (CYANOCOBALAMIN) 100 MCG tablet Take 1 tablet (100 mcg total) by mouth daily. 03/29/17 03/29/18  Regalado, Prentiss Bells, MD    Family History Family History  Problem Relation Age of Onset  . Emphysema Mother   . Throat cancer Mother   . COPD Mother   . Cancer Mother   . Emphysema Father   . COPD Father   . Stroke Father   . ADD / ADHD Son     Social History Social History   Tobacco Use  . Smoking status: Former Smoker    Packs/day: 1.00    Years: 11.00    Pack years: 11.00    Types: Cigarettes    Last attempt to quit: 09/21/1998    Years since quitting: 19.1  . Smokeless tobacco: Never Used  Substance Use Topics  . Alcohol use: No  . Drug use: No     Allergies   Latex; Ace inhibitors; Adhesive [tape]; and Lisinopril   Review of Systems Review of Systems  Constitutional: Negative for  chills and fever.  HENT: Negative for congestion.   Eyes: Negative for visual disturbance.  Respiratory: Negative for cough and shortness of breath.   Cardiovascular: Positive for leg swelling. Negative for chest pain.  Gastrointestinal: Negative for abdominal pain, diarrhea, nausea and vomiting.  Genitourinary: Negative for dysuria and hematuria.  Musculoskeletal: Negative for back pain and neck pain.  Skin: Positive for color change and wound. Negative for rash.  Neurological: Negative for dizziness, weakness, numbness and headaches.  Psychiatric/Behavioral: Negative for confusion.     Physical Exam Updated Vital Signs BP (!) 115/59 (BP Location: Right Arm)   Pulse 75   Temp 98.3 F (36.8 C) (Oral)   Resp 20   Ht 5\' 7"  (1.702 m)   Wt 82.4 kg (181 lb 9.6 oz)   LMP 11/19/2011   SpO2 100%   BMI 28.44 kg/m   Physical Exam  Constitutional: She is oriented to person, place, and time. She appears well-developed and well-nourished.  HENT:  Head: Normocephalic and atraumatic.  Mouth/Throat: Oropharynx is clear and moist and mucous membranes are normal.  Eyes: Conjunctivae, EOM and lids are normal. Pupils are equal, round, and reactive to light.  Neck: Full passive range of motion without pain.  Cardiovascular: Normal rate, regular rhythm and normal heart sounds. Exam reveals no gallop and no friction rub.  No murmur heard. Pulses:      Dorsalis pedis pulses are 1+ on the right side, and 1+ on the left side.  Pulmonary/Chest: Effort normal and breath sounds normal.  Abdominal: Soft. Normal appearance. There is no tenderness. There is no rigidity and no guarding.  Musculoskeletal: Normal range of motion.  2+ pitting edema to bilateral lower extremities that extends from the foot up to just below the knee bilaterally.  Right foot with amputation to digits 1 through 4.  Left foot with amputations to the first toe.  Neurological: She is alert and oriented to person, place, and time.    Skin: Skin is warm and dry. Capillary refill takes less than 2 seconds.  Anterior aspect of bilateral lower extremities is with significant warmth, erythema, R>L.   There are small scattered sores noted  to the anterior aspects of both lower extremities. Good distal cap refill.  BLE are not dusky in appearance or cool to touch.  Psychiatric: She has a normal mood and affect. Her speech is normal.  Nursing note and vitals reviewed.       ED Treatments / Results  Labs (all labs ordered are listed, but only abnormal results are displayed) Labs Reviewed  COMPREHENSIVE METABOLIC PANEL - Abnormal; Notable for the following components:      Result Value   CO2 19 (*)    Glucose, Bld 363 (*)    BUN 50 (*)    Creatinine, Ser 2.63 (*)    Calcium 8.3 (*)    Total Protein 6.1 (*)    Albumin 2.1 (*)    ALT 11 (*)    Total Bilirubin 1.3 (*)    GFR calc non Af Amer 20 (*)    GFR calc Af Amer 23 (*)    All other components within normal limits  CBC WITH DIFFERENTIAL/PLATELET - Abnormal; Notable for the following components:   RBC 3.43 (*)    Hemoglobin 9.6 (*)    HCT 31.6 (*)    Platelets 129 (*)    Lymphs Abs 0.4 (*)    All other components within normal limits  URINALYSIS, ROUTINE W REFLEX MICROSCOPIC - Abnormal; Notable for the following components:   Color, Urine AMBER (*)    APPearance HAZY (*)    Glucose, UA >=500 (*)    Hgb urine dipstick SMALL (*)    Ketones, ur 5 (*)    Protein, ur >=300 (*)    Bacteria, UA FEW (*)    Squamous Epithelial / LPF 6-30 (*)    All other components within normal limits  SEDIMENTATION RATE - Abnormal; Notable for the following components:   Sed Rate 100 (*)    All other components within normal limits  D-DIMER, QUANTITATIVE (NOT AT [facility]) - Abnormal; Notable for the following components:   D-Dimer, Quant 4.10 (*)    All other components within normal limits  CBC - Abnormal; Notable for the following components:   RBC 2.98 (*)    Hemoglobin 8.4  (*)    HCT 27.4 (*)    Platelets 136 (*)    All other components within normal limits  BASIC METABOLIC PANEL - Abnormal; Notable for the following components:   CO2 17 (*)    Glucose, Bld 368 (*)    BUN 63 (*)    Creatinine, Ser 2.90 (*)    Calcium 8.4 (*)    GFR calc non Af Amer 18 (*)    GFR calc Af Amer 21 (*)    All other components within normal limits  GLUCOSE, CAPILLARY - Abnormal; Notable for the following components:   Glucose-Capillary 330 (*)    All other components within normal limits  CBG MONITORING, ED - Abnormal; Notable for the following components:   Glucose-Capillary 326 (*)    All other components within normal limits  CBG MONITORING, ED - Abnormal; Notable for the following components:   Glucose-Capillary 383 (*)    All other components within normal limits  I-STAT BETA HCG BLOOD, ED ([facility], [facility], [facility] ONLY) - Abnormal; Notable for the following components:   I-stat hCG, quantitative 37.5 (*)    All other components within normal limits  MRSA PCR SCREENING  PROTIME-INR  APTT  C-REACTIVE PROTEIN  TACROLIMUS LEVEL  HEPARIN LEVEL (UNFRACTIONATED)  I-STAT CG4 LACTIC ACID, ED  I-STAT BETA HCG BLOOD,  ED ([facility], [facility], [facility] ONLY)  CBG MONITORING, ED    EKG  EKG Interpretation None       Radiology No results found.  Procedures Procedures (including critical care time)  Medications Ordered in ED Medications  carvedilol (COREG) tablet 50 mg (50 mg Oral Given 11/05/17 0303)  amLODipine (NORVASC) tablet 10 mg (10 mg Oral Given 11/05/17 0304)  mycophenolate (CELLCEPT) capsule 500 mg (500 mg Oral Given 11/05/17 0303)  predniSONE (DELTASONE) tablet 5 mg (not administered)  tacrolimus (PROGRAF) capsule 0.5-1 mg (not administered)  sodium bicarbonate tablet 1,300 mg (1,300 mg Oral Given 11/05/17 0303)  insulin aspart protamine- aspart (NOVOLOG MIX 70/30) injection 20 Units (not administered)  hydrALAZINE (APRESOLINE) tablet 25 mg (not administered)  0.9 %  sodium chloride  infusion ( Intravenous New Bag/Given 11/05/17 0431)  ondansetron (ZOFRAN) tablet 4 mg (not administered)    Or  ondansetron (ZOFRAN) injection 4 mg (not administered)  acetaminophen (TYLENOL) tablet 650 mg (650 mg Oral Given 11/05/17 0305)    Or  acetaminophen (TYLENOL) suppository 650 mg ( Rectal See Alternative 11/05/17 0305)  ipratropium-albuterol (DUONEB) 0.5-2.5 (3) MG/3ML nebulizer solution 3 mL (not administered)  insulin aspart (novoLOG) injection 0-15 Units (not administered)  insulin aspart (novoLOG) injection 0-5 Units (3 Units Subcutaneous Given 11/05/17 0303)  levothyroxine (SYNTHROID, LEVOTHROID) tablet 137 mcg (not administered)  MEDLINE mouth rinse (15 mLs Mouth Rinse Given 11/05/17 0305)  feeding supplement (ENSURE ENLIVE) (ENSURE ENLIVE) liquid 237 mL (not administered)  heparin ADULT infusion 100 units/mL (25000 units/290mL sodium chloride 0.45%) (1,300 Units/hr Intravenous Restarted 11/05/17 0435)  HYDROcodone-acetaminophen (NORCO/VICODIN) 5-325 MG per tablet 1-2 tablet (not administered)  sodium chloride 0.9 % bolus 500 mL (0 mLs Intravenous Stopped 11/05/17 0247)  insulin aspart (novoLOG) injection 4 Units (4 Units Subcutaneous Given 11/04/17 2209)  HYDROmorphone (DILAUDID) injection 0.5 mg (0.5 mg Intravenous Given 11/04/17 2210)  cefTRIAXone (ROCEPHIN) 1 g in sodium chloride 0.9 % 100 mL IVPB (0 g Intravenous Stopped 11/05/17 0117)  sodium chloride 0.9 % bolus 500 mL (0 mLs Intravenous Stopped 11/05/17 0422)  heparin bolus via infusion 3,000 Units (3,000 Units Intravenous Bolus from Bag 11/05/17 0431)     Initial Impression / Assessment and Plan / ED Course  I have reviewed the triage vital signs and the nursing notes.  Pertinent labs & imaging results that were available during my care of the patient were reviewed by me and considered in my medical decision making (see chart for details).     52 y.o. F with PMH/o poorly controlled Type II DM, CKD, who presents for evaluation of  worsening BLE swelling, erythema and pain. Also noted worsening wounds. .  Patient reports that she has a history of bilateral lower extremity edema that feels like over the last 3 days, symptoms have worsened.  She feels like the right is worse than the left.  Also reports pain and redness to the area.  Additionally, she has noticed several, scattered wounds noted to the anterior aspect of the right leg.  Patient reports her pain is worsened in the right leg.  No chest pain, difficulty breathing.  No fevers.  Patient did receive a renal transplant and is currently on transplant medications.  Patient does have a history of DVT to right lower extremity but is not currently on blood thinners. Patient is afebrile, non-toxic appearing, sitting comfortably on examination table. Vital signs reviewed and stable.  On exam, patient has 2+ pitting edema noted to bilateral lower extremities.  Right is slightly  greater than the left.  Additionally, patient has worsening redness, warmth, erythema noted to the anterior aspect of the right lower extremity.  She also has what appears to be chronic venous stasis changes noted.  There are several small, scattered wounds noted to the anterior aspect.  I reviewed patient's chart and she has a history of cellulitis noted to the right lower extremity.  I am concerned that there is some worsening cellulitis and today's presentation.  Also consider DVT, though low suspicion.  Patient also comes the ED for evaluation of hyperglycemia.  Over the last few days, her blood sugars were in the high.  She took her medications today.  No vomiting/abdominal pain.  Do not suspect DKA based on history/physical exam.  Suspect that likely hyperglycemia is from secondary inflammatory process.  Plan to check basic labs.  I-STAT lactic acid negative.  I-STAT beta is 37.5.  Records reviewed to that patient has had elevated I sets previously.  CBC shows anemia with hemoglobin of 9.6 and hematocrit of 31.6.   Platelets are 129.  This consistent with patient's previous.  CMP shows glucose of 363, bicarb is 19.  BUN is 50 and creatinine are 2.63.  This is a bump in patient's renal function.  Last labs done approximately 1 month ago showed creatinine at 1.51. Do not have suspicion for DKA. Patient given 4 units of insulin for hyperglycemia.  While I do suspect that patient's symptoms are chronic in nature, I do feel like there is some worsening infectious symptoms on the right lower extremity that is concerning for cellulitis.  Given that patient is immunocompromised, and there were concerned about cellulitis noted to right lower extremity, will likely need to come into the hospital for admission for IV antibiotics.  Additionally, patient get an ultrasound of the right lower extremity to evaluate for any possible DVT.  Discussed patient with Dr. Katrinka Blazing (hospitalist). Will admit.    Final Clinical Impressions(s) / ED Diagnoses   Final diagnoses:  Hyperglycemia  Wound cellulitis  Bilateral leg edema  AKI (acute kidney injury) [facility])    ED Discharge Orders    None       Maxwell Caul, PA-C 11/05/17 0503    Phillis Haggis, MD 11/06/17 [phone]

## 2017-11-04 NOTE — H&P (Signed)
History and Physical    [name] DDU:[id number] DOB: Sep 24, 1965 DOA: 11/04/2017  Referring MD/NP/PA: Simonne Martinet, PA-C PCP: Hayden Rasmussen, MD  Patient coming from: Home via EMS  Chief Complaint: Lower extremity swelling and pain  I have personally briefly reviewed patient's old medical records in [place]   HPI: [name] is a 52 y.o. female with medical history significant of HTN, hypothyroidism, s/p renal transplant on chronic immunosuppressive therapy, DM type I, depression, history of DVT not currently on anticoagulation; who presented with complaints of worsening bilateral lower extremity pain swelling and redness over the last several days.  She reports having chronic lower extremity edema that fluctuates, but noticed over the last 3-4 days symptoms progressively worsened.  Notes developing redness and increased warmth of both lower legs, but worse on the right.  Tried taking Tylenol without relief of pain.  Pain was so severe that she was unable to ambulate and felt that she may fall if she tried to stand up.  Denies having any recent trauma or scrapes to the leg, chest pain, fever, black/bloody stools, or dysuria.  She also complained of persistently elevated blood sugars into the 300s.   ED Course: Upon admission to the emergency department patient was noted to have stable vital signs.  Labs revealed WBC 7, hemoglobin 9.6, platelets 129, BUN 50, creatinine 2.63, glucose 363,  and lactic acid 1.53.  Patient was given 500 mL of normal saline IV fluid and Rocephin.  [facility] called to admit.  Review of Systems  Constitutional: Positive for malaise/fatigue. Negative for chills and fever.  HENT: Negative for congestion and ear pain.   Eyes: Negative for photophobia and pain.  Respiratory: Negative for shortness of breath and wheezing.   Cardiovascular: Positive for leg swelling. Negative for chest pain.  Gastrointestinal: Negative for blood in stool, nausea and  vomiting.  Genitourinary: Positive for frequency. Negative for dysuria.  Musculoskeletal: Positive for myalgias. Negative for falls.  Skin: Positive for rash.       Positive for skin color change  Neurological: Negative for sensory change and loss of consciousness.  Endo/Heme/Allergies: Positive for polydipsia.  Psychiatric/Behavioral: Negative for substance abuse and suicidal ideas.    Past Medical History:  Diagnosis Date  . Arthritis    "elbows, knees, legs, back" (09/24/2015)  . CKD (chronic kidney disease)   . Daily headache   . Depression    "years ago"  . End stage renal disease (HCC)    right arm AV graft, resolved post transplant  . Hypertension   . Hypothyroid   . 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 Kidney disease 2011  . Pneumonia ~ 2007?  [name] Type II diabetes mellitus (HCC)    Insulin dependant    Past Surgical History:  Procedure Laterality Date  . AMPUTATION Left 05/11/2014   Procedure: AMPUTATION LEFT GREAT TOE;  Surgeon: Mcarthur Rossetti, MD;  Location: [facility];  Service: Orthopedics;  Laterality: Left;  . AV FISTULA PLACEMENT Right    forearm  . BACK SURGERY    . CATARACT EXTRACTION W/ INTRAOCULAR LENS  IMPLANT, BILATERAL Bilateral   . CESAREAN SECTION  07/1999  . DG AV DIALYSIS GRAFT DECLOT OR    . HIP PINNING,CANNULATED Right 06/02/2017   Procedure: CANNULATED RIGHT HIP PINNING;  Surgeon: Netta Cedars, MD;  Location: [facility];  Service: Orthopedics;  Laterality: Right;  . INCISION AND DRAINAGE Right 06/02/2015   Procedure: INCISION AND DRAINAGE OF  RIGHT 3rd RAY RESECTION;  Surgeon: Mcarthur Rossetti, MD;  Location: [facility];  Service: Orthopedics;  Laterality: Right;  . 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 [place] SURGERY  2001  . NEPHRECTOMY TRANSPLANTED ORGAN    . TOE AMPUTATION Right    1,2 & 3rd toes.  . TUBAL LIGATION  07/1999     reports that she quit smoking about 19 years ago. Her smoking use included  cigarettes. She has a 11.00 pack-year smoking history. she has never used smokeless tobacco. She reports that she does not drink alcohol or use drugs.  Allergies  Allergen Reactions  . Latex Itching  . Ace Inhibitors Cough  . Adhesive [Tape] Itching and Other (See Comments)    Please use paper tape  . Lisinopril Cough    Family History  Problem Relation Age of Onset  . Emphysema Mother   . Throat cancer Mother   . COPD Mother   . Cancer Mother   . Emphysema Father   . COPD Father   . Stroke Father   . ADD / ADHD Son     Prior to Admission medications   Medication Sig Start Date End Date Taking? Authorizing Provider  acetaminophen (TYLENOL) 325 MG tablet Take 2 tablets (650 mg total) by mouth every 6 (six) hours as needed for mild pain, moderate pain, fever or headache. 05/15/16  Yes Hongalgi, Lenis Dickinson, MD  amLODipine (NORVASC) 10 MG tablet Take 1 tablet (10 mg total) by mouth daily. Patient taking differently: Take 10 mg by mouth at bedtime.  08/14/16  Yes Regalado, Belkys A, MD  carvedilol (COREG) 25 MG tablet Take 50 mg by mouth 2 (two) times daily. 02/26/17  Yes [provider]  hydrALAZINE (APRESOLINE) 25 MG tablet Take 1 tablet (25 mg total) by mouth every 12 (twelve) hours. 09/04/17  Yes Velvet Bathe, MD  Insulin Isophane & Regular Human (NOVOLIN 70/30 FLEXPEN RELION) (70-30) 100 UNIT/ML PEN Inject 20 Units into the skin 2 (two) times daily before a meal. PER SLIDING SCALE   Yes [provider]  mycophenolate (CELLCEPT) 250 MG capsule Take 500 mg by mouth 2 (two) times daily.    Yes [provider]  predniSONE (DELTASONE) 5 MG tablet Take 5 mg by mouth daily.  05/29/15  Yes [provider]  sodium bicarbonate 650 MG tablet Take 2 tablets (1,300 mg total) by mouth 2 (two) times daily. 09/04/17  Yes Velvet Bathe, MD  SYNTHROID 137 MCG tablet Take 137 mcg by mouth daily. 04/10/17  Yes [provider]  tacrolimus (PROGRAF) 1 MG capsule Take  1 capsule (1 mg total) by mouth daily before breakfast. Patient taking differently: Take 0.5-1 mg by mouth daily before breakfast. Take 0.5 mg by mouth each morning and 1 mg by mouth each evening 09/05/17  Yes Velvet Bathe, MD  Continuous Blood Gluc Receiver (FREESTYLE LIBRE READER) DEVI 1 each by Does not apply route 4 (four) times daily. 04/29/17   Caren Griffins, MD  Continuous Blood Gluc Sensor (FREESTYLE LIBRE SENSOR SYSTEM) MISC 1 sensor every 10 days 04/29/17   Caren Griffins, MD  HYDROcodone-acetaminophen (NORCO) 5-325 MG tablet Take 0.5-1 tablets by mouth every 4 (four) hours as needed for moderate pain or severe pain. 06/02/17   Netta Cedars, MD  vitamin B-12 (CYANOCOBALAMIN) 100 MCG tablet Take 1 tablet (100 mcg total) by mouth daily. 03/29/17 03/29/18  Elmarie Shiley, MD    Physical Exam:  Constitutional: Older female who  appears to be in some moderate discomfort. Vitals:   11/04/17 1341 11/04/17 1726 11/04/17 2210  BP: 115/67 113/68 107/61  Pulse: 79 81 74  Resp: '12 20 20  ' Temp: 98.4 F (36.9 C) 98.1 F (36.7 C) 98.4 F (36.9 C)  TempSrc: Oral Oral Oral  SpO2: 94% 99% 97%   Eyes: PERRL, lids and conjunctivae normal ENMT: Mucous membranes are dry. posterior pharynx clear of any exudate or lesions..  Neck: normal, supple, no masses, no thyromegaly Respiratory: clear to auscultation bilaterally, no wheezing, no crackles. Normal respiratory effort. No accessory muscle use.  Cardiovascular: Regular rate and rhythm, no murmurs / rubs / gallops. 2+ lower extremity edema. 2+ pedal pulses. No carotid bruits.  Abdomen: no tenderness, no masses palpated. No hepatosplenomegaly. Bowel sounds positive.  Musculoskeletal: no clubbing / cyanosis. No joint deformity upper and lower extremities. Good ROM, no contractures. Normal muscle tone.  Skin: Erythema noted of the bilateral lower extremities. Neurologic: CN 2-12 grossly intact. Sensation intact, DTR normal. Strength 5/5 in all  4.  Psychiatric: Normal judgment and insight. Alert and oriented x 3. Normal mood.     Labs on Admission: I have personally reviewed following labs and imaging studies  CBC: Recent Labs  Lab 11/04/17 1356  WBC 7.0  NEUTROABS 6.2  HGB 9.6*  HCT 31.6*  MCV 92.1  PLT 281*   Basic Metabolic Panel: Recent Labs  Lab 11/04/17 1356  NA 135  K 4.4  CL 105  CO2 19*  GLUCOSE 363*  BUN 50*  CREATININE 2.63*  CALCIUM 8.3*   GFR: CrCl cannot be calculated (Unknown ideal weight.). Liver Function Tests: Recent Labs  Lab 11/04/17 1356  AST 20  ALT 11*  ALKPHOS 86  BILITOT 1.3*  PROT 6.1*  ALBUMIN 2.1*   No results for input(s): LIPASE, AMYLASE in the last 168 hours. No results for input(s): AMMONIA in the last 168 hours. Coagulation Profile: No results for input(s): INR, PROTIME in the last 168 hours. Cardiac Enzymes: No results for input(s): CKTOTAL, CKMB, CKMBINDEX, TROPONINI in the last 168 hours. BNP (last 3 results) No results for input(s): PROBNP in the last 8760 hours. HbA1C: No results for input(s): HGBA1C in the last 72 hours. CBG: Recent Labs  Lab 11/04/17 1346 11/04/17 2303  GLUCAP 326* 383*   Lipid Profile: No results for input(s): CHOL, HDL, LDLCALC, TRIG, CHOLHDL, LDLDIRECT in the last 72 hours. Thyroid Function Tests: No results for input(s): TSH, T4TOTAL, FREET4, T3FREE, THYROIDAB in the last 72 hours. Anemia Panel: No results for input(s): VITAMINB12, FOLATE, FERRITIN, TIBC, IRON, RETICCTPCT in the last 72 hours. Urine analysis:    Component Value Date/Time   COLORURINE AMBER (A) 11/04/2017 2051   APPEARANCEUR HAZY (A) 11/04/2017 2051   LABSPEC 1.016 11/04/2017 2051   PHURINE 5.0 11/04/2017 2051   GLUCOSEU >=500 (A) 11/04/2017 2051   HGBUR SMALL (A) 11/04/2017 2051   BILIRUBINUR NEGATIVE 11/04/2017 2051   KETONESUR 5 (A) 11/04/2017 2051   PROTEINUR >=300 (A) 11/04/2017 2051   UROBILINOGEN 0.2 03/02/2015 1153   NITRITE NEGATIVE 11/04/2017  2051   LEUKOCYTESUR NEGATIVE 11/04/2017 2051   Sepsis Labs: No results found for this or any previous visit (from the past 240 hour(s)).   Radiological Exams on Admission: No results found.  EKG: Independently reviewed.  Sinus rhythm with RBBB unchanged.  No signs of prolonged QTC  Assessment/Plan Lower extremity edema (cellulitis vs right lower extremity DVT): Acute.  Given patient previous history of DVT high suspicion for recurrence.  Placed on empiric antibiotics of Rocephin for the possibility also of cellulitis. - Admit to a telemetry bed - Continue Rocephin - Check d-dimer, ESR, CRP - Check Doppler ultrasound of lower extremities - Heparin per pharmacy  Acute renal failure on chronic kidney disease, s/p renal transplant on chronic immunosuppressive therapy: Patient baseline creatinine previously noted to be around 1.5, but presents with a creatinine of 2.63 and BUN 50.  Elevated BUN to creatinine ratio suggest prerenal cause of symptoms.  - Check Prograf level - Continue CellCept, Prograf, prednisone - Bolus additional 500 mL of normal saline IV fluids, then placed at a rate of 100 mL/h  Diabetes mellitus type 1 with hyperglycemia: Patient's last hemoglobin A1c noted to be 9.9 on 04/2017.  Initial glucose 363 without elevated anion gap. - Hypoglycemic protocol - Continue home insulin regimen - CBGs q. before meals and at bedtime with moderate SSI  Anemia of chronic disease: Hemoglobin appears to be near patient's baseline of 8-9 g/dL - Recheck CBC in a.m.  Essential hypertension - Continue Coreg, hydralazine, and amlodipine as tolerated  Hypothyroidism: Previously noted to be not well controlled back in 2018. - Add on TSH - Continue Synthroid  History of DVT: Patient with previous history of DVT but appears to have been provoked not on anticoagulation at this time.  DVT prophylaxis: heparin Code Status: Full  Family Communication: No family present at  bedside Disposition Plan: TBD Consults called: NONE Admission status: inpaitent   Norval Morton MD [facility] Pager [phone]   If 7PM-7AM, please contact night-coverage [email or web address] Password [facility]  11/04/2017, 11:22 PM

## 2017-11-04 NOTE — ED Notes (Signed)
Pts CBG 326 

## 2017-11-04 NOTE — ED Triage Notes (Signed)
Per EMS, patient coming from home, c/o hyperglycemia x3 days. Reports taking insulin as prescribed. Patient also c/o bilateral leg pain x2 days. Redness and swelling to right leg. Hx DVT and cellulitis. Ambulatory with EMS.  CBG 387

## 2017-11-05 ENCOUNTER — Encounter (HOSPITAL_COMMUNITY): Payer: Self-pay

## 2017-11-05 ENCOUNTER — Inpatient Hospital Stay (HOSPITAL_COMMUNITY): Payer: Medicare Other

## 2017-11-05 ENCOUNTER — Other Ambulatory Visit: Payer: Self-pay

## 2017-11-05 ENCOUNTER — Encounter (HOSPITAL_COMMUNITY): Payer: Medicare Other

## 2017-11-05 DIAGNOSIS — M7989 Other specified soft tissue disorders: Secondary | ICD-10-CM | POA: Diagnosis not present

## 2017-11-05 DIAGNOSIS — Z86718 Personal history of other venous thrombosis and embolism: Secondary | ICD-10-CM | POA: Diagnosis not present

## 2017-11-05 DIAGNOSIS — L03115 Cellulitis of right lower limb: Secondary | ICD-10-CM | POA: Diagnosis present

## 2017-11-05 DIAGNOSIS — D631 Anemia in chronic kidney disease: Secondary | ICD-10-CM | POA: Diagnosis present

## 2017-11-05 DIAGNOSIS — Z9981 Dependence on supplemental oxygen: Secondary | ICD-10-CM | POA: Diagnosis not present

## 2017-11-05 DIAGNOSIS — R601 Generalized edema: Secondary | ICD-10-CM | POA: Diagnosis present

## 2017-11-05 DIAGNOSIS — N179 Acute kidney failure, unspecified: Secondary | ICD-10-CM | POA: Diagnosis present

## 2017-11-05 DIAGNOSIS — Z7952 Long term (current) use of systemic steroids: Secondary | ICD-10-CM | POA: Diagnosis not present

## 2017-11-05 DIAGNOSIS — T8619 Other complication of kidney transplant: Secondary | ICD-10-CM | POA: Diagnosis present

## 2017-11-05 DIAGNOSIS — E039 Hypothyroidism, unspecified: Secondary | ICD-10-CM | POA: Diagnosis present

## 2017-11-05 DIAGNOSIS — R600 Localized edema: Secondary | ICD-10-CM | POA: Diagnosis not present

## 2017-11-05 DIAGNOSIS — R0602 Shortness of breath: Secondary | ICD-10-CM

## 2017-11-05 DIAGNOSIS — R5383 Other fatigue: Secondary | ICD-10-CM | POA: Diagnosis not present

## 2017-11-05 DIAGNOSIS — Z940 Kidney transplant status: Secondary | ICD-10-CM | POA: Diagnosis not present

## 2017-11-05 DIAGNOSIS — L03116 Cellulitis of left lower limb: Secondary | ICD-10-CM | POA: Diagnosis present

## 2017-11-05 DIAGNOSIS — R609 Edema, unspecified: Secondary | ICD-10-CM | POA: Diagnosis not present

## 2017-11-05 DIAGNOSIS — D899 Disorder involving the immune mechanism, unspecified: Secondary | ICD-10-CM | POA: Diagnosis present

## 2017-11-05 DIAGNOSIS — M9701XA Periprosthetic fracture around internal prosthetic right hip joint, initial encounter: Secondary | ICD-10-CM | POA: Diagnosis not present

## 2017-11-05 DIAGNOSIS — Z96641 Presence of right artificial hip joint: Secondary | ICD-10-CM | POA: Diagnosis not present

## 2017-11-05 DIAGNOSIS — E1022 Type 1 diabetes mellitus with diabetic chronic kidney disease: Secondary | ICD-10-CM | POA: Diagnosis not present

## 2017-11-05 DIAGNOSIS — E1065 Type 1 diabetes mellitus with hyperglycemia: Secondary | ICD-10-CM | POA: Diagnosis not present

## 2017-11-05 DIAGNOSIS — E10649 Type 1 diabetes mellitus with hypoglycemia without coma: Secondary | ICD-10-CM | POA: Diagnosis present

## 2017-11-05 DIAGNOSIS — L97509 Non-pressure chronic ulcer of other part of unspecified foot with unspecified severity: Secondary | ICD-10-CM | POA: Diagnosis present

## 2017-11-05 DIAGNOSIS — X58XXXA Exposure to other specified factors, initial encounter: Secondary | ICD-10-CM | POA: Diagnosis not present

## 2017-11-05 DIAGNOSIS — R21 Rash and other nonspecific skin eruption: Secondary | ICD-10-CM | POA: Diagnosis not present

## 2017-11-05 DIAGNOSIS — D638 Anemia in other chronic diseases classified elsewhere: Secondary | ICD-10-CM | POA: Diagnosis not present

## 2017-11-05 DIAGNOSIS — J029 Acute pharyngitis, unspecified: Secondary | ICD-10-CM | POA: Diagnosis not present

## 2017-11-05 DIAGNOSIS — S72001A Fracture of unspecified part of neck of right femur, initial encounter for closed fracture: Secondary | ICD-10-CM | POA: Diagnosis not present

## 2017-11-05 DIAGNOSIS — E10621 Type 1 diabetes mellitus with foot ulcer: Secondary | ICD-10-CM | POA: Diagnosis present

## 2017-11-05 DIAGNOSIS — Z961 Presence of intraocular lens: Secondary | ICD-10-CM | POA: Diagnosis present

## 2017-11-05 DIAGNOSIS — J9611 Chronic respiratory failure with hypoxia: Secondary | ICD-10-CM | POA: Diagnosis present

## 2017-11-05 DIAGNOSIS — L0390 Cellulitis, unspecified: Secondary | ICD-10-CM | POA: Diagnosis not present

## 2017-11-05 DIAGNOSIS — Z79899 Other long term (current) drug therapy: Secondary | ICD-10-CM | POA: Diagnosis not present

## 2017-11-05 DIAGNOSIS — Z9842 Cataract extraction status, left eye: Secondary | ICD-10-CM | POA: Diagnosis not present

## 2017-11-05 DIAGNOSIS — Z87891 Personal history of nicotine dependence: Secondary | ICD-10-CM | POA: Diagnosis not present

## 2017-11-05 DIAGNOSIS — Y830 Surgical operation with transplant of whole organ as the cause of abnormal reaction of the patient, or of later complication, without mention of misadventure at the time of the procedure: Secondary | ICD-10-CM | POA: Diagnosis present

## 2017-11-05 DIAGNOSIS — Z5181 Encounter for therapeutic drug level monitoring: Secondary | ICD-10-CM | POA: Diagnosis not present

## 2017-11-05 DIAGNOSIS — E876 Hypokalemia: Secondary | ICD-10-CM | POA: Diagnosis not present

## 2017-11-05 DIAGNOSIS — I10 Essential (primary) hypertension: Secondary | ICD-10-CM | POA: Diagnosis not present

## 2017-11-05 DIAGNOSIS — N189 Chronic kidney disease, unspecified: Secondary | ICD-10-CM | POA: Diagnosis not present

## 2017-11-05 DIAGNOSIS — R627 Adult failure to thrive: Secondary | ICD-10-CM | POA: Diagnosis present

## 2017-11-05 DIAGNOSIS — N183 Chronic kidney disease, stage 3 (moderate): Secondary | ICD-10-CM | POA: Diagnosis present

## 2017-11-05 DIAGNOSIS — R739 Hyperglycemia, unspecified: Secondary | ICD-10-CM | POA: Diagnosis present

## 2017-11-05 DIAGNOSIS — Z89412 Acquired absence of left great toe: Secondary | ICD-10-CM | POA: Diagnosis not present

## 2017-11-05 DIAGNOSIS — I129 Hypertensive chronic kidney disease with stage 1 through stage 4 chronic kidney disease, or unspecified chronic kidney disease: Secondary | ICD-10-CM | POA: Diagnosis present

## 2017-11-05 LAB — CBC
HCT: 27.4 % — ABNORMAL LOW (ref 36.0–46.0)
Hemoglobin: 8.4 g/dL — ABNORMAL LOW (ref 12.0–15.0)
MCH: 28.2 pg (ref 26.0–34.0)
MCHC: 30.7 g/dL (ref 30.0–36.0)
MCV: 91.9 fL (ref 78.0–100.0)
PLATELETS: 136 10*3/uL — AB (ref 150–400)
RBC: 2.98 MIL/uL — ABNORMAL LOW (ref 3.87–5.11)
RDW: 14.9 % (ref 11.5–15.5)
WBC: 8.7 10*3/uL (ref 4.0–10.5)

## 2017-11-05 LAB — GLUCOSE, CAPILLARY
GLUCOSE-CAPILLARY: 266 mg/dL — AB (ref 65–99)
GLUCOSE-CAPILLARY: 99 mg/dL (ref 65–99)
GLUCOSE-CAPILLARY: 124 mg/dL — AB (ref 65–99)
Glucose-Capillary: 330 mg/dL — ABNORMAL HIGH (ref 65–99)
Glucose-Capillary: 165 mg/dL — ABNORMAL HIGH (ref 65–99)
Glucose-Capillary: 125 mg/dL — ABNORMAL HIGH (ref 65–99)

## 2017-11-05 LAB — APTT: APTT: 36 s (ref 24–36)

## 2017-11-05 LAB — BASIC METABOLIC PANEL
ANION GAP: 13 (ref 5–15)
BUN: 63 mg/dL — ABNORMAL HIGH (ref 6–20)
CALCIUM: 8.4 mg/dL — AB (ref 8.9–10.3)
CO2: 17 mmol/L — AB (ref 22–32)
Chloride: 107 mmol/L (ref 101–111)
Creatinine, Ser: 2.9 mg/dL — ABNORMAL HIGH (ref 0.44–1.00)
GFR, EST AFRICAN AMERICAN: 21 mL/min — AB (ref >60)
GFR, EST NON AFRICAN AMERICAN: 18 mL/min — AB (ref >60)
Glucose, Bld: 368 mg/dL — ABNORMAL HIGH (ref 65–99)
Potassium: 4.8 mmol/L (ref 3.5–5.1)
SODIUM: 137 mmol/L (ref 135–145)

## 2017-11-05 LAB — CREATININE, URINE, RANDOM: Creatinine, Urine: 103.13 mg/dL

## 2017-11-05 LAB — LIPID PANEL
CHOL/HDL RATIO: 4.1 ratio
Cholesterol: 114 mg/dL (ref 0–200)
HDL: 28 mg/dL — ABNORMAL LOW (ref >40)
LDL Cholesterol: 49 mg/dL (ref 0–99)
Triglycerides: 187 mg/dL — ABNORMAL HIGH (ref <150)
VLDL: 37 mg/dL (ref 0–40)

## 2017-11-05 LAB — MRSA PCR SCREENING: MRSA by PCR: POSITIVE — AB

## 2017-11-05 LAB — T4, FREE: Free T4: 1.17 ng/dL — ABNORMAL HIGH (ref 0.61–1.12)

## 2017-11-05 LAB — SODIUM, URINE, RANDOM

## 2017-11-05 LAB — PROTIME-INR
INR: 1.07
PROTHROMBIN TIME: 13.8 s (ref 11.4–15.2)

## 2017-11-05 LAB — PREGNANCY, URINE: PREG TEST UR: NEGATIVE

## 2017-11-05 LAB — SEDIMENTATION RATE: Sed Rate: 100 mm/hr — ABNORMAL HIGH (ref 0–22)

## 2017-11-05 LAB — TSH: TSH: 8.857 u[IU]/mL — ABNORMAL HIGH (ref 0.350–4.500)

## 2017-11-05 LAB — HEPARIN LEVEL (UNFRACTIONATED): Heparin Unfractionated: 0.14 IU/mL — ABNORMAL LOW (ref 0.30–0.70)

## 2017-11-05 LAB — C-REACTIVE PROTEIN: CRP: 32.3 mg/dL — ABNORMAL HIGH (ref <1.0)

## 2017-11-05 LAB — D-DIMER, QUANTITATIVE (NOT AT ARMC): D DIMER QUANT: 4.1 ug{FEU}/mL — AB (ref 0.00–0.50)

## 2017-11-05 MED ORDER — HEPARIN (PORCINE) IN NACL 100-0.45 UNIT/ML-% IJ SOLN
1500.0000 [IU]/h | INTRAMUSCULAR | Status: DC
  Administered 2017-11-05: 1500 [IU]/h via INTRAVENOUS
  Filled 2017-11-05: qty 250

## 2017-11-05 MED ORDER — SODIUM CHLORIDE 0.9 % IV SOLN
INTRAVENOUS | Status: DC
  Administered 2017-11-05 (×3): via INTRAVENOUS

## 2017-11-05 MED ORDER — HYDROCODONE-ACETAMINOPHEN 5-325 MG PO TABS
1.0000 | ORAL_TABLET | Freq: Four times a day (QID) | ORAL | Status: DC | PRN
  Administered 2017-11-05: 1 via ORAL
  Administered 2017-11-06: 2 via ORAL
  Administered 2017-11-06: 1 via ORAL
  Administered 2017-11-06: 2 via ORAL
  Administered 2017-11-07: 1 via ORAL
  Administered 2017-11-07 – 2017-11-12 (×12): 2 via ORAL
  Filled 2017-11-05 (×2): qty 2
  Filled 2017-11-05: qty 1
  Filled 2017-11-05 (×5): qty 2
  Filled 2017-11-05 (×2): qty 1
  Filled 2017-11-05 (×8): qty 2

## 2017-11-05 MED ORDER — TACROLIMUS 0.5 MG PO CAPS
0.5000 mg | ORAL_CAPSULE | Freq: Every day | ORAL | Status: DC
  Administered 2017-11-05 – 2017-11-12 (×8): 0.5 mg via ORAL
  Filled 2017-11-05 (×8): qty 1

## 2017-11-05 MED ORDER — INSULIN ASPART 100 UNIT/ML ~~LOC~~ SOLN
0.0000 [IU] | Freq: Three times a day (TID) | SUBCUTANEOUS | Status: DC
  Administered 2017-11-05: 2 [IU] via SUBCUTANEOUS
  Administered 2017-11-05 – 2017-11-06 (×2): 3 [IU] via SUBCUTANEOUS
  Administered 2017-11-07: 5 [IU] via SUBCUTANEOUS
  Administered 2017-11-07: 3 [IU] via SUBCUTANEOUS
  Administered 2017-11-08: 8 [IU] via SUBCUTANEOUS
  Administered 2017-11-08: 3 [IU] via SUBCUTANEOUS
  Administered 2017-11-10: 2 [IU] via SUBCUTANEOUS
  Administered 2017-11-10: 3 [IU] via SUBCUTANEOUS
  Administered 2017-11-10 – 2017-11-11 (×2): 5 [IU] via SUBCUTANEOUS
  Administered 2017-11-11: 2 [IU] via SUBCUTANEOUS
  Administered 2017-11-12: 5 [IU] via SUBCUTANEOUS
  Administered 2017-11-12: 3 [IU] via SUBCUTANEOUS

## 2017-11-05 MED ORDER — HYDRALAZINE HCL 25 MG PO TABS
25.0000 mg | ORAL_TABLET | Freq: Two times a day (BID) | ORAL | Status: DC
  Administered 2017-11-05 – 2017-11-10 (×11): 25 mg via ORAL
  Filled 2017-11-05 (×14): qty 1

## 2017-11-05 MED ORDER — ACETAMINOPHEN 325 MG PO TABS
650.0000 mg | ORAL_TABLET | Freq: Four times a day (QID) | ORAL | Status: DC | PRN
  Administered 2017-11-05: 650 mg via ORAL
  Filled 2017-11-05: qty 2

## 2017-11-05 MED ORDER — TACROLIMUS 1 MG PO CAPS
1.0000 mg | ORAL_CAPSULE | Freq: Every day | ORAL | Status: DC
  Administered 2017-11-05 – 2017-11-11 (×7): 1 mg via ORAL
  Filled 2017-11-05 (×8): qty 1

## 2017-11-05 MED ORDER — MUPIROCIN 2 % EX OINT
1.0000 "application " | TOPICAL_OINTMENT | Freq: Two times a day (BID) | CUTANEOUS | Status: AC
  Administered 2017-11-05 – 2017-11-10 (×10): 1 via NASAL
  Filled 2017-11-05: qty 22

## 2017-11-05 MED ORDER — ACETAMINOPHEN 650 MG RE SUPP: 650.0000 mg | Freq: Four times a day (QID) | RECTAL | Status: DC | PRN

## 2017-11-05 MED ORDER — AMLODIPINE BESYLATE 5 MG PO TABS
10.0000 mg | ORAL_TABLET | Freq: Every day | ORAL | Status: DC
  Administered 2017-11-05 – 2017-11-09 (×6): 10 mg via ORAL
  Filled 2017-11-05 (×6): qty 2

## 2017-11-05 MED ORDER — ENSURE ENLIVE PO LIQD
237.0000 mL | Freq: Two times a day (BID) | ORAL | Status: DC
  Administered 2017-11-05: 237 mL via ORAL

## 2017-11-05 MED ORDER — HEPARIN (PORCINE) IN NACL 100-0.45 UNIT/ML-% IJ SOLN
1300.0000 [IU]/h | INTRAMUSCULAR | Status: DC
  Filled 2017-11-05: qty 250

## 2017-11-05 MED ORDER — HEPARIN BOLUS VIA INFUSION
1000.0000 [IU] | Freq: Once | INTRAVENOUS | Status: AC
Start: 2017-11-05 — End: 2017-11-05
  Administered 2017-11-05: 1000 [IU] via INTRAVENOUS
  Filled 2017-11-05: qty 1000

## 2017-11-05 MED ORDER — ONDANSETRON HCL 4 MG PO TABS: 4.0000 mg | ORAL_TABLET | Freq: Four times a day (QID) | ORAL | Status: DC | PRN

## 2017-11-05 MED ORDER — ADULT MULTIVITAMIN W/MINERALS CH
1.0000 | ORAL_TABLET | Freq: Every day | ORAL | Status: DC
  Administered 2017-11-06 – 2017-11-12 (×7): 1 via ORAL
  Filled 2017-11-05 (×7): qty 1

## 2017-11-05 MED ORDER — CHLORHEXIDINE GLUCONATE CLOTH 2 % EX PADS
6.0000 | MEDICATED_PAD | Freq: Every day | CUTANEOUS | Status: AC
  Administered 2017-11-06 – 2017-11-10 (×5): 6 via TOPICAL

## 2017-11-05 MED ORDER — LEVOTHYROXINE SODIUM 137 MCG PO TABS
137.0000 ug | ORAL_TABLET | Freq: Every day | ORAL | Status: DC
  Filled 2017-11-05: qty 1

## 2017-11-05 MED ORDER — SODIUM BICARBONATE 650 MG PO TABS
1300.0000 mg | ORAL_TABLET | Freq: Two times a day (BID) | ORAL | Status: DC
  Administered 2017-11-05 – 2017-11-06 (×4): 1300 mg via ORAL
  Filled 2017-11-05 (×4): qty 2

## 2017-11-05 MED ORDER — TECHNETIUM TC 99M DIETHYLENETRIAME-PENTAACETIC ACID
32.4000 | Freq: Once | INTRAVENOUS | Status: AC
  Administered 2017-11-05: 32.4 via RESPIRATORY_TRACT

## 2017-11-05 MED ORDER — SODIUM CHLORIDE 0.9 % IV SOLN
1.0000 g | INTRAVENOUS | Status: DC
  Administered 2017-11-05 – 2017-11-06 (×2): 1 g via INTRAVENOUS
  Filled 2017-11-05 (×2): qty 1
  Filled 2017-11-05: qty 10

## 2017-11-05 MED ORDER — MUPIROCIN 2 % EX OINT
TOPICAL_OINTMENT | Freq: Two times a day (BID) | CUTANEOUS | Status: DC
  Administered 2017-11-05 – 2017-11-09 (×9): via TOPICAL

## 2017-11-05 MED ORDER — PREMIER PROTEIN SHAKE
11.0000 [oz_av] | Freq: Two times a day (BID) | ORAL | Status: DC
  Administered 2017-11-05 – 2017-11-12 (×8): 11 [oz_av] via ORAL
  Filled 2017-11-05 (×15): qty 325.31

## 2017-11-05 MED ORDER — HYDROMORPHONE HCL 1 MG/ML IJ SOLN
0.5000 mg | INTRAMUSCULAR | Status: AC | PRN
  Administered 2017-11-05 (×2): 0.5 mg via INTRAVENOUS
  Filled 2017-11-05 (×2): qty 0.5

## 2017-11-05 MED ORDER — TECHNETIUM TO 99M ALBUMIN AGGREGATED
4.3000 | Freq: Once | INTRAVENOUS | Status: AC | PRN
  Administered 2017-11-05: 4.3 via INTRAVENOUS

## 2017-11-05 MED ORDER — PREDNISONE 5 MG PO TABS
5.0000 mg | ORAL_TABLET | Freq: Every day | ORAL | Status: DC
  Administered 2017-11-05 – 2017-11-12 (×8): 5 mg via ORAL
  Filled 2017-11-05 (×8): qty 1

## 2017-11-05 MED ORDER — HEPARIN SODIUM (PORCINE) 5000 UNIT/ML IJ SOLN
5000.0000 [IU] | Freq: Three times a day (TID) | INTRAMUSCULAR | Status: DC
  Administered 2017-11-05 – 2017-11-12 (×20): 5000 [IU] via SUBCUTANEOUS
  Filled 2017-11-05 (×20): qty 1

## 2017-11-05 MED ORDER — STERILE WATER FOR INJECTION IV SOLN
INTRAVENOUS | Status: AC
  Administered 2017-11-05 – 2017-11-06 (×2): via INTRAVENOUS
  Filled 2017-11-05 (×3): qty 850

## 2017-11-05 MED ORDER — MYCOPHENOLATE MOFETIL 250 MG PO CAPS
500.0000 mg | ORAL_CAPSULE | Freq: Two times a day (BID) | ORAL | Status: DC
  Administered 2017-11-05 – 2017-11-12 (×16): 500 mg via ORAL
  Filled 2017-11-05 (×16): qty 2

## 2017-11-05 MED ORDER — HEPARIN BOLUS VIA INFUSION
3000.0000 [IU] | Freq: Once | INTRAVENOUS | Status: AC
Start: 2017-11-05 — End: 2017-11-05
  Administered 2017-11-05: 3000 [IU] via INTRAVENOUS
  Filled 2017-11-05: qty 3000

## 2017-11-05 MED ORDER — CARVEDILOL 25 MG PO TABS
50.0000 mg | ORAL_TABLET | Freq: Two times a day (BID) | ORAL | Status: DC
  Administered 2017-11-05 – 2017-11-10 (×13): 50 mg via ORAL
  Filled 2017-11-05 (×14): qty 2

## 2017-11-05 MED ORDER — INSULIN ASPART PROT & ASPART (70-30 MIX) 100 UNIT/ML ~~LOC~~ SUSP
20.0000 [IU] | Freq: Two times a day (BID) | SUBCUTANEOUS | Status: DC
  Administered 2017-11-05 – 2017-11-07 (×4): 20 [IU] via SUBCUTANEOUS
  Filled 2017-11-05 (×3): qty 10

## 2017-11-05 MED ORDER — SODIUM CHLORIDE 0.9 % IV BOLUS (SEPSIS)
500.0000 mL | Freq: Once | INTRAVENOUS | Status: AC
  Administered 2017-11-05: 500 mL via INTRAVENOUS

## 2017-11-05 MED ORDER — ONDANSETRON HCL 4 MG/2ML IJ SOLN
4.0000 mg | Freq: Four times a day (QID) | INTRAMUSCULAR | Status: DC | PRN
  Administered 2017-11-05 – 2017-11-09 (×2): 4 mg via INTRAVENOUS
  Filled 2017-11-05 (×2): qty 2

## 2017-11-05 MED ORDER — IPRATROPIUM-ALBUTEROL 0.5-2.5 (3) MG/3ML IN SOLN: 3.0000 mL | RESPIRATORY_TRACT | Status: DC | PRN

## 2017-11-05 MED ORDER — ORAL CARE MOUTH RINSE
15.0000 mL | Freq: Two times a day (BID) | OROMUCOSAL | Status: DC
  Administered 2017-11-05 – 2017-11-12 (×12): 15 mL via OROMUCOSAL

## 2017-11-05 MED ORDER — INSULIN ASPART 100 UNIT/ML ~~LOC~~ SOLN
0.0000 [IU] | Freq: Every day | SUBCUTANEOUS | Status: DC
  Administered 2017-11-05: 3 [IU] via SUBCUTANEOUS
  Administered 2017-11-11: 2 [IU] via SUBCUTANEOUS

## 2017-11-05 MED ORDER — SODIUM CHLORIDE 0.9 % IV BOLUS (SEPSIS)
1000.0000 mL | Freq: Once | INTRAVENOUS | Status: AC
  Administered 2017-11-05: 1000 mL via INTRAVENOUS

## 2017-11-05 MED ORDER — LEVOTHYROXINE SODIUM 25 MCG PO TABS
137.0000 ug | ORAL_TABLET | Freq: Every day | ORAL | Status: DC
  Administered 2017-11-05 – 2017-11-12 (×8): 137 ug via ORAL
  Filled 2017-11-05 (×8): qty 1

## 2017-11-05 NOTE — Progress Notes (Signed)
Inpatient Diabetes Program Recommendations  AACE/ADA: New Consensus Statement on Inpatient Glycemic Control (2015)  Target Ranges:  Prepandial:   less than 140 mg/dL      Peak postprandial:   less than 180 mg/dL (1-2 hours)      Critically ill patients:  140 - 180 mg/dL   Lab Results  Component Value Date   GLUCAP 99 11/05/2017   HGBA1C 9.4 (H) 04/25/2017    Review of Glycemic Control Results for [name], [name] (MRN [id number]) as of 11/05/2017 12:11  Ref. Range 11/04/2017 23:03 11/05/2017 02:54 11/05/2017 05:29 11/05/2017 07:37 11/05/2017 11:48  Glucose-Capillary Latest Ref Range: 65 - 99 mg/dL 409383 (H) 811330 (H) 914266 (H) 165 (H) 99   Diabetes history: DM2 Outpatient Diabetes medications: Novolin 70/30 insulin mix 20 units bid Current orders for Inpatient glycemic control: Novolog 70/30 insulin 20 units bid + Novolog moderate correction tid  Inpatient Diabetes Program Recommendations:   Spoke with patient @ bedside to discuss diabetes management @ home. Patient states her CBGs have been running approximately 400-500 @ times without adjustment in her insulin. Reviewed with patient need for glucose control and requested patient to take glucometer or log of CBGs to appointments with physicians for adjustment of insulin as needed. Patient agrees to limit sweetened drinks including juice. Will follow during hospitalization.  Thank you, Billy FischerJudy E. Aeris Hersman, RN, MSN, CDE  Diabetes Coordinator Inpatient Glycemic Control Team Team Pager [phone] (8am-5pm) 11/05/2017 12:15 PM

## 2017-11-05 NOTE — Progress Notes (Signed)
CRITICAL VALUE ALERT  Critical Value:  MRSA positive  Date & Time Notied:  11/05/2017 1515  Provider Notified:  Orders Received/Actions taken: standing orders initiated

## 2017-11-05 NOTE — Progress Notes (Signed)
ANTICOAGULATION CONSULT NOTE - Initial Consult  Pharmacy Consult for Heparin Indication: DVT  Allergies  Allergen Reactions  . Latex Itching  . Ace Inhibitors Cough  . Adhesive [Tape] Itching and Other (See Comments)    Please use paper tape  . Lisinopril Cough    Patient Measurements: Height: 5\' 7"  (170.2 cm) Weight: 181 lb 9.6 oz (82.4 kg) IBW/kg (Calculated) : 61.6 Heparin Dosing Weight:   Vital Signs: Temp: 98.3 F (36.8 C) (03/01 0235) Temp Source: Oral (03/01 0235) BP: 115/59 (03/01 0235) Pulse Rate: 75 (03/01 0235)  Labs: Recent Labs    11/04/17 1356 11/05/17 0257  HGB 9.6* 8.4*  HCT 31.6* 27.4*  PLT 129* 136*  APTT  --  36  LABPROT  --  13.8  INR  --  1.07  CREATININE 2.63* 2.90*    Estimated Creatinine Clearance: 25.6 mL/min (A) (by C-G formula based on SCr of 2.9 mg/dL (H)).   Medical History: Past Medical History:  Diagnosis Date  . Arthritis    "elbows, knees, legs, back" (09/24/2015)  . CKD (chronic kidney disease)   . Daily headache   . Depression    "years ago"  . End stage renal disease (HCC)    right arm AV graft, resolved post transplant  . Hypertension   . Hypothyroid   . 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 Kidney disease 2011  . Pneumonia ~ 2007?  [name]. Type II diabetes mellitus (HCC)    Insulin dependant    Medications:  Infusions:  . sodium chloride 100 mL/hr at 11/05/17 0248  . heparin      Assessment: Patient with r/o DVT and + D-Dimer.  Baseline coags WNL.  No oral anticoagulants noted on med rec.   Goal of Therapy:  Heparin level 0.3-0.7 units/ml Monitor platelets by anticoagulation protocol: Yes   Plan:  Heparin bolus 3000 units iv x1 Heparin drip at 1300 units/hr Daily CBC Next heparin level at [address]    Caraline Deutschman Jr, [place] 11/05/2017,4:02 AM

## 2017-11-05 NOTE — Consult Note (Signed)
Reason for Consult: Acute kidney injury on chronic kidney disease stage III T Referring Physician: Carin Hockmar Sheikh MD [facility])  HPI:  [name] with past medical history significant for end-stage renal disease secondary to diabetes mellitus status post cadaveric kidney [facility]16/[facility] induced with [place] and on maintenance with Prograf/CellCept and prednisone.  She also has a history of hypertension, hypothyroidism and currently chronic kidney disease stage III T with a baseline creatinine that appears to be around 1.5 at stable state.  Unfortunately, her compliance has been far from optimal with her last visit at the renal clinic being in June, 2018 prior to which she had not been seen for 18 months.  She has a propensity for admissions to the hospital with DKA.  She was admitted last night with bilateral leg redness, swelling and pain which on evaluation was negative for DVT.  Concern is raised with her elevated creatinine on admission at 2.6 that has risen to 2.9 on repeat labs this morning.  She also has some mild worsening of metabolic acidosis.  Other than leg pain, she complains of some subjective fevers and chills without any nausea, vomiting or diarrhea.  Reports that her appetite was unchanged and fluid intake consistent.  Denies any pain over her allograft site.  Additional concern raised with increased proteinuria on urinalysis.  Past Medical History:  Diagnosis Date  . Arthritis    "elbows, knees, legs, back" (09/24/2015)  . CKD (chronic kidney disease)   . Daily headache   . Depression    "years ago"  . End stage renal disease (HCC)    right arm AV graft, resolved post transplant  . Hypertension   . Hypothyroid   . 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 Kidney disease 2011  . Pneumonia ~ 2007?  [name]. Type II diabetes mellitus (HCC)    Insulin dependant    Past Surgical History:  Procedure Laterality Date  .  AMPUTATION Left 05/11/2014   Procedure: AMPUTATION LEFT GREAT TOE;  Surgeon: Kathryne Hitchhristopher Y Blackman, MD;  Location: [facility];  Service: Orthopedics;  Laterality: Left;  . AV FISTULA PLACEMENT Right    forearm  . BACK SURGERY    . CATARACT EXTRACTION W/ INTRAOCULAR LENS  IMPLANT, BILATERAL Bilateral   . CESAREAN SECTION  07/1999  . DG AV DIALYSIS GRAFT DECLOT OR    . HIP PINNING,CANNULATED Right 06/02/2017   Procedure: CANNULATED RIGHT HIP PINNING;  Surgeon: Beverely LowNorris, Steve, MD;  Location: [facility];  Service: Orthopedics;  Laterality: Right;  . INCISION AND DRAINAGE Right 06/02/2015   Procedure: INCISION AND DRAINAGE OF RIGHT 3rd RAY RESECTION;  Surgeon: Kathryne Hitchhristopher Y Blackman, MD;  Location: [facility];  Service: Orthopedics;  Laterality: Right;  . 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 LUMBAR DISC SURGERY  2001  . NEPHRECTOMY TRANSPLANTED ORGAN    . TOE AMPUTATION Right    1,2 & 3rd toes.  . TUBAL LIGATION  07/1999    Family History  Problem Relation Age of Onset  . Emphysema Mother   . Throat cancer Mother   . COPD Mother   . Cancer Mother   . Emphysema Father   . COPD Father   . Stroke Father   . ADD / ADHD Son     Social History:  reports that she quit smoking about 19 years ago. Her smoking use included cigarettes. She has a 11.00 pack-year smoking history. she has never used smokeless tobacco. She  reports that she does not drink alcohol or use drugs.  Allergies:  Allergies  Allergen Reactions  . Latex Itching  . Ace Inhibitors Cough  . Adhesive [Tape] Itching and Other (See Comments)    Please use paper tape  . Lisinopril Cough    Medications:  Scheduled: . amLODipine  10 mg Oral QHS  . carvedilol  50 mg Oral BID  . feeding supplement (ENSURE ENLIVE)  237 mL Oral BID BM  . heparin  1,000 Units Intravenous Once  . hydrALAZINE  25 mg Oral Q12H  . insulin aspart  0-15 Units Subcutaneous TID WC  . insulin aspart  0-5 Units Subcutaneous QHS  . insulin aspart protamine-  aspart  20 Units Subcutaneous BID AC  . levothyroxine  137 mcg Oral QAC breakfast  . mouth rinse  15 mL Mouth Rinse BID  . mupirocin ointment   Topical BID  . mycophenolate  500 mg Oral BID  . predniSONE  5 mg Oral Daily  . sodium bicarbonate  1,300 mg Oral BID  . tacrolimus  0.5 mg Oral QAC breakfast  . tacrolimus  1 mg Oral q1800    BMP Latest Ref Rng & Units 11/05/2017 11/04/2017 09/04/2017  Glucose 65 - 99 mg/dL 604(V) 409(W) 119(J)  BUN 6 - 20 mg/dL 47(W) 29(F) 13  Creatinine 0.44 - 1.00 mg/dL 6.21(H) 0.86(V) 7.84(O)  Sodium 135 - 145 mmol/L 137 135 135  Potassium 3.5 - 5.1 mmol/L 4.8 4.4 4.3  Chloride 101 - 111 mmol/L 107 105 100(L)  CO2 22 - 32 mmol/L 17(L) 19(L) 26  Calcium 8.9 - 10.3 mg/dL 9.6(E) 8.3(L) 9.0   CBC Latest Ref Rng & Units 11/05/2017 11/04/2017 09/02/2017  WBC 4.0 - 10.5 K/uL 8.7 7.0 4.0  Hemoglobin 12.0 - 15.0 g/dL 9.5(M) 8.4(X) 3.2(G)  Hematocrit 36.0 - 46.0 % 27.4(L) 31.6(L) 29.4(L)  Platelets 150 - 400 K/uL 136(L) 129(L) 120(L)    No results found.  Review of Systems  Constitutional: Positive for chills, fever and malaise/fatigue.  HENT: Negative for nosebleeds.   Respiratory: Negative.   Cardiovascular: Positive for leg swelling. Negative for chest pain, palpitations and orthopnea.  Gastrointestinal: Negative.   Genitourinary: Negative.   Skin: Positive for rash.       Redness/pain/swelling both legs  Neurological: Positive for weakness. Negative for dizziness and headaches.   Blood pressure (!) 115/54, pulse 71, temperature 98.1 F (36.7 C), temperature source Oral, resp. rate 20, height 5\' 7"  (1.702 m), weight 82.4 kg (181 lb 9.6 oz), last menstrual period 11/19/2011, SpO2 91 %. Physical Exam  Constitutional: She is oriented to person, place, and time. She appears well-developed and well-nourished. No distress.  HENT:  Head: Normocephalic and atraumatic.  Mouth/Throat: Oropharynx is clear and moist.  Eyes: Conjunctivae and EOM are normal. Pupils  are equal, round, and reactive to light.  Neck: Normal range of motion. Neck supple. No JVD present.  Cardiovascular: Normal rate, regular rhythm and normal heart sounds.  No murmur heard. Respiratory: Effort normal and breath sounds normal. She has no wheezes. She has no rales.  GI: Soft. She exhibits no distension. There is no tenderness. There is no rebound.  No tenderness over renal allograft right lower quadrant  Musculoskeletal: She exhibits edema and tenderness.  Trace-1+ tender pitting edema of the lower extremities with circumferential erythema over lower legs  Neurological: She is alert and oriented to person, place, and time.  Skin: Skin is warm. No rash noted. There is erythema.  Psychiatric: She has  a normal mood and affect. Her behavior is normal.    Assessment/Plan: 1.  Acute kidney injury on chronic kidney disease stage III T: Most likely hemodynamically mediated with what appears to be cellulitis and possibly may have a problem into ATN based on data so far.  Will contain her current rate of isotonic fluids but switch them over to isotonic bicarbonate to help counter metabolic acidosis.  We will send her for some urine electrolytes and continue immunosuppressive therapy.  We will check a Prograf level with labs tomorrow.  Renal ultrasound pending.  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 2.  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 3.  Bilateral leg swelling/redness: Clinically appears consistent with cellulitis without any evidence of DVT.  Concern raised with her elevated d-dimer and plans noted for VQ scan of the lungs to rule out PE.  May also need to get a CT scan (noncontrast) of the abdomen and pelvis to evaluate for possible mass/lesion interfering with drainage of the  lower extremities. 4.  Hypertension: Blood pressure is under good control at this time and may possibly need to decrease antihypertensive dosing if she develops hypotension/renal hypoperfusion. 5.  Anemia: Secondary to chronic kidney disease, check iron studies  [name] K. 11/05/2017, 1:42 PM

## 2017-11-05 NOTE — ED Notes (Signed)
ED TO INPATIENT HANDOFF REPORT  Name/Age/Gender [name] 52 y.o. female  Code Status    Code Status Orders  (From admission, onward)        Start     Ordered   11/05/17 0107  Full code  Continuous     11/05/17 0114    Code Status History    Date Active Date Inactive Code Status Order ID Comments User Context   08/31/2017 19:37 09/04/2017 20:03 Full Code [id number]  Etheleen Nicks, MD ED   06/02/2017 16:29 06/06/2017 13:58 Full Code [id number]  Donne Hazel, MD ED   05/09/2017 13:39 05/10/2017 20:16 Full Code [id number]  Radene Gunning, NP ED   04/25/2017 16:24 04/29/2017 19:01 Full Code [id number]  Georgette Shell, MD ED   04/25/2017 16:19 04/25/2017 16:24 Full Code [id number]  Georgette Shell, MD ED   04/13/2017 00:04 04/14/2017 16:51 Full Code [id number]  Karmen Bongo, MD Inpatient   03/23/2017 23:23 03/29/2017 16:36 Full Code [id number]  Reubin Milan, MD Inpatient   03/01/2017 00:07 03/03/2017 17:22 Full Code [id number]  Rise Patience, MD ED   01/26/2017 23:40 01/28/2017 16:54 Full Code [id number]  Reubin Milan, MD Inpatient   01/26/2017 23:40 01/26/2017 23:40 Full Code [id number]  Reubin Milan, MD Inpatient   01/01/2017 23:39 01/02/2017 19:27 Full Code [id number]  Lily Kocher, MD ED   10/05/2016 15:40 10/09/2016 17:35 Full Code [id number]  Patrecia Pour, MD Inpatient   09/16/2016 20:56 09/18/2016 21:01 Full Code [id number]  Barton Dubois, MD Inpatient   08/12/2016 00:58 08/14/2016 18:31 Full Code [id number]  Vianne Bulls, MD ED   07/21/2016 23:44 07/23/2016 21:36 Full Code [id number]  Rise Patience, MD Inpatient   06/26/2016 14:50 06/30/2016 23:02 Full Code [id number]  Debbe Odea, MD ED   05/13/2016 03:21 05/15/2016 23:25 Full Code [id number]  Toy Baker, MD Inpatient   04/10/2016 09:39 04/19/2016 20:32 Full Code [id number]  Roxan Hockey, MD Inpatient   03/27/2016 15:53 03/31/2016 22:44 Full Code [id number]  Erick Colace, NP ED   11/03/2015 00:32  11/06/2015 15:25 Full Code [id number]  Reubin Milan, MD Inpatient   09/24/2015 14:22 09/26/2015 16:12 Full Code [id number]  Waldemar Dickens, MD ED   09/24/2015 14:22 09/24/2015 14:22 Full Code [id number]  Waldemar Dickens, MD ED   06/01/2015 23:09 06/06/2015 22:14 Full Code [id number]  Ivor Costa, MD ED   03/02/2015 13:32 03/04/2015 20:21 Full Code [id number]  Robbie Lis, MD ED   01/02/2015 15:44 01/03/2015 20:28 Full Code [id number]  Verlee Monte, MD Inpatient   10/22/2014 19:37 10/25/2014 15:07 Full Code [id number]  Waldemar Dickens, MD ED   06/07/2014 21:43 06/10/2014 15:20 Full Code [id number]  Etta Quill, DO ED   05/03/2014 21:52 05/12/2014 19:07 Full Code [id number]  Rise Patience, MD Inpatient   04/20/2014 03:08 04/21/2014 19:04 Full Code [id number]  Elmarie Shiley, MD Inpatient   03/21/2014 23:51 03/23/2014 19:17 Full Code [id number]  Merton Border, MD Inpatient   08/04/2013 12:36 08/06/2013 20:05 Full Code [id number]  Orson Eva, MD ED   06/05/2013 20:32 06/09/2013 21:41 Full Code [id number]  Rise Patience, MD Inpatient   03/05/2013 02:31 03/07/2013 14:11 Full Code [id number]  Hosie Poisson, MD ED   01/05/2013 10:56 01/07/2013 15:43 Full Code [id number]  Modena Jansky, MD Inpatient   08/04/2012 15:43 08/08/2012 16:59 Full Code [id number]  Barry Dienes ED   11/20/2011 16:11 11/22/2011 13:57 Full Code [id number]  Charlynne Cousins,  MD Inpatient   09/22/2011 19:07 09/26/2011 16:45 Full Code [id number]  Bynum Bellows, MD Inpatient      Home/SNF/Other Home  Chief Complaint Leg Pain  Level of Care/Admitting Diagnosis ED Disposition    ED Disposition Condition [facility] Area: [facility] [[id number]]  Level of Care: Telemetry [5]  Admit to tele based on following criteria: Complex arrhythmia (Bradycardia/Tachycardia)  Diagnosis: Localized swelling of lower extremity [8889169]  Admitting Physician: Norval Morton [[id number]]  Attending Physician: Norval Morton  [[id number]]  Estimated length of stay: past midnight tomorrow  Certification:: I certify this patient will need inpatient services for at least 2 midnights  PT Class (Do Not Modify): Inpatient [101]  PT Acc Code (Do Not Modify): Private [1]       Medical History Past Medical History:  Diagnosis Date  . Arthritis    "elbows, knees, legs, back" (09/24/2015)  . CKD (chronic kidney disease)   . Daily headache   . Depression    "years ago"  . End stage renal disease (HCC)    right arm AV graft, resolved post transplant  . Hypertension   . Hypothyroid   . 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 Kidney disease 2011  . Pneumonia ~ 2007?  [name] Type II diabetes mellitus (HCC)    Insulin dependant    Allergies Allergies  Allergen Reactions  . Latex Itching  . Ace Inhibitors Cough  . Adhesive [Tape] Itching and Other (See Comments)    Please use paper tape  . Lisinopril Cough    IV Location/Drains/Wounds Patient Lines/Drains/Airways Status   Active Line/Drains/Airways    Name:   Placement date:   Placement time:   Site:   Days:   Peripheral IV 11/04/17 Right Hand   11/04/17    2158    Hand   1   Incision (Closed) 06/02/17 Hip Right   06/02/17    2040     156          Labs/Imaging Results for orders placed or performed during the hospital encounter of 11/04/17 (from the past 48 hour(s))  CBG monitoring, ED     Status: Abnormal   Collection Time: 11/04/17  1:46 PM  Result Value Ref Range   Glucose-Capillary 326 (H) 65 - 99 mg/dL  Comprehensive metabolic panel     Status: Abnormal   Collection Time: 11/04/17  1:56 PM  Result Value Ref Range   Sodium 135 135 - 145 mmol/L   Potassium 4.4 3.5 - 5.1 mmol/L   Chloride 105 101 - 111 mmol/L   CO2 19 (L) 22 - 32 mmol/L   Glucose, Bld 363 (H) 65 - 99 mg/dL   BUN 50 (H) 6 - 20 mg/dL   Creatinine, Ser 2.63 (H) 0.44 - 1.00 mg/dL   Calcium 8.3 (L) 8.9 - 10.3 mg/dL   Total Protein 6.1 (L) 6.5 - 8.1 g/dL   Albumin  2.1 (L) 3.5 - 5.0 g/dL   AST 20 15 - 41 U/L   ALT 11 (L) 14 - 54 U/L   Alkaline Phosphatase 86 38 - 126 U/L   Total Bilirubin 1.3 (H) 0.3 - 1.2 mg/dL   GFR calc non Af Amer 20 (L) >60 mL/min   GFR calc Af Amer 23 (L) >60 mL/min    Comment: (NOTE) The eGFR has been calculated using the CKD EPI equation. This calculation has not been validated in all clinical situations. eGFR's  persistently <60 mL/min signify possible Chronic Kidney Disease.    Anion gap 11 5 - 15    Comment: Performed at [facility], [place] [address] [place], [place] [address]  CBC with Differential     Status: Abnormal   Collection Time: 11/04/17  1:56 PM  Result Value Ref Range   WBC 7.0 4.0 - 10.5 K/uL   RBC 3.43 (L) 3.87 - 5.11 MIL/uL   Hemoglobin 9.6 (L) 12.0 - 15.0 g/dL   HCT 31.6 (L) 36.0 - 46.0 %   MCV 92.1 78.0 - 100.0 fL   MCH 28.0 26.0 - 34.0 pg   MCHC 30.4 30.0 - 36.0 g/dL   RDW 15.0 11.5 - 15.5 %   Platelets 129 (L) 150 - 400 K/uL   Neutrophils Relative % 89 %   Lymphocytes Relative 6 %   Monocytes Relative 5 %   Eosinophils Relative 0 %   Basophils Relative 0 %   Neutro Abs 6.2 1.7 - 7.7 K/uL   Lymphs Abs 0.4 (L) 0.7 - 4.0 K/uL   Monocytes Absolute 0.4 0.1 - 1.0 K/uL   Eosinophils Absolute 0.0 0.0 - 0.7 K/uL   Basophils Absolute 0.0 0.0 - 0.1 K/uL   WBC Morphology MILD LEFT SHIFT (1-5% METAS, OCC MYELO, OCC BANDS)     Comment: DOHLE BODIES Performed at [facility], [place] [address], [place], [place] [address]   I-Stat beta hCG blood, ED     Status: Abnormal   Collection Time: 11/04/17  2:11 PM  Result Value Ref Range   I-stat hCG, quantitative 37.5 (H) <5 mIU/mL   Comment 3            Comment:   GEST. AGE      CONC.  (mIU/mL)   <=1 WEEK        5 - 50     2 WEEKS       50 - 500     3 WEEKS       100 - 10,000     4 WEEKS     1,000 - 30,000        FEMALE AND NON-PREGNANT FEMALE:     LESS THAN 5 mIU/mL   I-Stat CG4 Lactic Acid, ED     Status: None    Collection Time: 11/04/17  2:14 PM  Result Value Ref Range   Lactic Acid, Venous 1.53 0.5 - 1.9 mmol/L  Urinalysis, Routine w reflex microscopic     Status: Abnormal   Collection Time: 11/04/17  8:51 PM  Result Value Ref Range   Color, Urine AMBER (A) YELLOW    Comment: BIOCHEMICALS MAY BE AFFECTED BY COLOR   APPearance HAZY (A) CLEAR   Specific Gravity, Urine 1.016 1.005 - 1.030   pH 5.0 5.0 - 8.0   Glucose, UA >=500 (A) NEGATIVE mg/dL   Hgb urine dipstick SMALL (A) NEGATIVE   Bilirubin Urine NEGATIVE NEGATIVE   Ketones, ur 5 (A) NEGATIVE mg/dL   Protein, ur >=300 (A) NEGATIVE mg/dL   Nitrite NEGATIVE NEGATIVE   Leukocytes, UA NEGATIVE NEGATIVE   RBC / HPF 6-30 0 - 5 RBC/hpf   WBC, UA 6-30 0 - 5 WBC/hpf   Bacteria, UA FEW (A) NONE SEEN   Squamous Epithelial / LPF 6-30 (A) NONE SEEN   Mucus PRESENT    Budding Yeast PRESENT    Hyaline Casts, UA PRESENT    Amorphous Crystal PRESENT     Comment: Performed at [facility]  [place] [address], [place],  [address]  CBG monitoring, ED     Status: Abnormal   Collection Time: 11/04/17 11:03 PM  Result Value Ref Range   Glucose-Capillary 383 (H) 65 - 99 mg/dL   Comment 1 Document in Chart    No results found.  Pending Labs Unresulted Labs (From admission, onward)   Start     Ordered   11/05/17 0500  CBC  Tomorrow morning,   R     11/05/17 0114   11/05/17 1410  Basic metabolic panel  Tomorrow morning,   R     11/05/17 0114   11/05/17 0123  Protime-INR  Once-Timed,   R     11/05/17 0122   11/05/17 0123  APTT  Once,   R     11/05/17 0122   11/05/17 0111  Tacrolimus level  Add-on,   R     11/05/17 0114   11/04/17 2324  Sedimentation rate  Add-on,   R     11/04/17 2323   11/04/17 2324  C-reactive protein  Add-on,   R     11/04/17 2323   11/04/17 2324  D-dimer, quantitative (not at [facility])  Add-on,   R     11/04/17 2323      Vitals/Pain Today's Vitals   11/04/17 1726 11/04/17 2210 11/04/17 2236 11/05/17  0129  BP: 113/68 107/61  110/60  Pulse: 81 74  77  Resp: '20 20  20  ' Temp: 98.1 F (36.7 C) 98.4 F (36.9 C)  98.5 F (36.9 C)  TempSrc: Oral Oral  Oral  SpO2: 99% 97%  98%  PainSc:  '9  7  5     ' Isolation Precautions No active isolations  Medications Medications  carvedilol (COREG) tablet 50 mg (not administered)  amLODipine (NORVASC) tablet 10 mg (not administered)  mycophenolate (CELLCEPT) capsule 500 mg (not administered)  predniSONE (DELTASONE) tablet 5 mg (not administered)  tacrolimus (PROGRAF) capsule 0.5-1 mg (not administered)  levothyroxine (SYNTHROID, LEVOTHROID) tablet 137 mcg (not administered)  sodium bicarbonate tablet 1,300 mg (not administered)  Insulin Isophane & Regular Human (HUMULIN 70/30 MIX) (70-30) 100 UNIT/ML 20 Units (not administered)  hydrALAZINE (APRESOLINE) tablet 25 mg (not administered)  0.9 %  sodium chloride infusion (not administered)  sodium chloride 0.9 % bolus 500 mL (not administered)  ondansetron (ZOFRAN) tablet 4 mg (not administered)    Or  ondansetron (ZOFRAN) injection 4 mg (not administered)  acetaminophen (TYLENOL) tablet 650 mg (not administered)    Or  acetaminophen (TYLENOL) suppository 650 mg (not administered)  ipratropium-albuterol (DUONEB) 0.5-2.5 (3) MG/3ML nebulizer solution 3 mL (not administered)  insulin aspart (novoLOG) injection 0-15 Units (not administered)  insulin aspart (novoLOG) injection 0-5 Units (not administered)  sodium chloride 0.9 % bolus 500 mL (500 mLs Intravenous New Bag/Given 11/04/17 2209)  insulin aspart (novoLOG) injection 4 Units (4 Units Subcutaneous Given 11/04/17 2209)  HYDROmorphone (DILAUDID) injection 0.5 mg (0.5 mg Intravenous Given 11/04/17 2210)  cefTRIAXone (ROCEPHIN) 1 g in sodium chloride 0.9 % 100 mL IVPB (0 g Intravenous Stopped 11/05/17 0117)    Mobility walks with device ( likes using wheel chair here )

## 2017-11-05 NOTE — Progress Notes (Addendum)
Initial Nutrition Assessment  DOCUMENTATION CODES:   Not applicable  INTERVENTION:    Modify diet for chopped meats  Provide Premier Protein BID, each supplement provides 160kcal and 30g protein.   Provide MVI daily  NUTRITION DIAGNOSIS:   Biting/chewing difficulty related to other (see comment)(poor dentition) as evidenced by per patient/family report.  GOAL:   Patient will meet greater than or equal to 90% of their needs  MONITOR:   PO intake, Supplement acceptance, Weight trends, Labs  REASON FOR ASSESSMENT:   Malnutrition Screening Tool    ASSESSMENT:   Pt with PMH significant for HTN, s/p renal transplant on chronic immunosuppressive therapy, DM type I, and depression. Presents this admission with complaints of bilateral lower extremity pain and swelling over the last several days. Admitted for Lower extremity cellulitis and ARF on CKD.    Spoke with pt at bedside. Denies any loss of appetite PTA. States she typically eats 2-3 meals with snacks throughout the day of softer food options. Pt has known history of poor dentition related to not having many top teeth. Discussed changing her diet to have chopped meats to help with chewing. RD to order.Pt is currently on a heart healthy diet/CM diet and consumed 0% of her breakfast this morning. RD to add supplementation and MVI.   Pt denies any recent wt loss and UBW of 181lb. Records show fluctuating weights likely related to fluid accumulation and loss. Hard to quantify dry weight loss at this time. Nutrition-Focused physical exam completed. Bilateral lower extremities show to have moderate enema. This may be masking muscle depletion and weight loss.   Medications reviewed and include: SSI, novolog mix 70/30, prednisone, sodium bicarbonate, NS @ 100 ml/hr, IV abx Labs reviewed: CBG 368 (H) BUN 63 (H) Creatinine 2.90 (H)  NUTRITION - FOCUSED PHYSICAL EXAM:    Most Recent Value  Orbital Region  No depletion  Upper Arm  Region  No depletion  Thoracic and Lumbar Region  Unable to assess  Buccal Region  Mild depletion  Temple Region  Moderate depletion  Clavicle Bone Region  Mild depletion  Clavicle and Acromion Bone Region  Mild depletion  Scapular Bone Region  Unable to assess  Dorsal Hand  No depletion  Patellar Region  Mild depletion  Anterior Thigh Region  No depletion  Posterior Calf Region  No depletion  Edema (RD Assessment)  Moderate  Hair  Reviewed  Eyes  Reviewed  Mouth  Reviewed  Skin  Reviewed  Nails  Reviewed     Diet Order:  Diet heart healthy/carb modified Room service appropriate? Yes; Fluid consistency: Thin  EDUCATION NEEDS:   Not appropriate for education at this time  Skin:  Skin Assessment: Reviewed RN Assessment  Last BM:  11/04/17  Height:   Ht Readings from Last 1 Encounters:  11/05/17 5\' 7"  (1.702 m)    Weight:   Wt Readings from Last 1 Encounters:  11/05/17 181 lb 9.6 oz (82.4 kg)    Ideal Body Weight:  61.4 kg  BMI:  Body mass index is 28.44 kg/m.  Estimated Nutritional Needs:   Kcal:  1600-1800 kcal/day  Protein:  80-90 g/day  Fluid:  >1.6 L/day    Vanessa Kickarly Etienne Mowers RD, LDN Clinical Nutrition Pager # [phone]

## 2017-11-05 NOTE — Consult Note (Signed)
WOC Nurse wound consult note Reason for Consult: Erythema to bilateral lower legs, chronic skin changes.  Has never worn compression or been diagnosed with venous insufficiency, per patient.  Preliminary findings negative for DVT.  Pain is managed at this time.  Wound type: Unclear, WBC 8.7 but is on immunosuppressive therapy.  Pressure Injury POA: NA Measurement: Erythema around malleolus area circumferentially Wound bed: pink  Drainage (amount, consistency, odor) none noted Periwound:intact, erythematous Dressing procedure/placement/frequency: Cleanse legs with soap and water.  Mupirocin ointment to lower legs, cracked skin.   Will not follow at this time.  Please re-consult if needed.  Maple HudsonKaren Cleve Paolillo RN BSN CWON Pager [phone]

## 2017-11-05 NOTE — Progress Notes (Signed)
LE venous duplex prelim: negative for DVT. Ellard Nan Eunice, RDMS, RVT  

## 2017-11-05 NOTE — Progress Notes (Signed)
PROGRESS NOTE    [name]  RWE:[id number] DOB: [birth year] DOA: 11/04/2017 PCP: Hayden Rasmussen, MD   Brief Narrative:  HPI per Dr. Tamala Julian: [name] is a 52 y.o. female with medical history significant of HTN, hypothyroidism, s/p renal transplant on chronic immunosuppressive therapy, DM type I, depression, history of DVT not currently on anticoagulation; who presented with complaints of worsening bilateral lower extremity pain swelling and redness over the last several days.  She reports having chronic lower extremity edema that fluctuates, but noticed over the last 3-4 days symptoms progressively worsened.  Notes developing redness and increased warmth of both lower legs, but worse on the right.  Tried taking Tylenol without relief of pain.  Pain was so severe that she was unable to ambulate and felt that she may fall if she tried to stand up.  Denies having any recent trauma or scrapes to the leg, chest pain, fever, black/bloody stools, or dysuria.  She also complained of persistently elevated blood sugars into the 300s.  ED Course: Upon admission to the emergency department patient was noted to have stable vital signs.  Labs revealed WBC 7, hemoglobin 9.6, platelets 129, BUN 50, creatinine 2.63, glucose 363,  and lactic acid 1.53.  Patient was given 500 mL of normal saline IV fluid and Rocephin.  TRH called to admit.  *Patient felt ok and states swelling in Legs started worsening the last few weeks. Nursing informed has not urinated since 2:00 AM.   Assessment & Plan:   Principal Problem:   Localized swelling of lower extremity Active Problems:   Immunosuppression ([facility])   Hypothyroidism   H/O kidney transplant   Essential hypertension   Uncontrolled type 1 diabetes mellitus with foot ulcer (HCC)   Anemia of chronic disease  Bilateral Lower extremity edema (cellulitis vs right lower extremity DVT):  -Acute. Given patient previous history of DVT high suspicion for  recurrence.  Placed on empiric antibiotics of Rocephin for the possibility also of cellulitis and will continue for now. -Admitted a telemetry bed -Continue with Rocephin -Check D-Dimer and was 4.10, ESR was 100, CRP was 32.3 -Checked Doppler ultrasound of lower extremities and did not show acute DVT -Heparin gtt started and dosed per pharmacy but discontinued as VQ Scan was Normal and LE DVT Negative -VQ Scan done given Elevated D-Dimer of 4 but was Normal -CT Abd/Pelvis done and showed no mass or intraabdominal or intrapevlic abnormalities -Unfortunately cannot get CT Abd/Pelvis with Contrast due to Renal Fxn to evaluate for ? Iliac Blockages  Acute renal failure on chronic kidney disease Stage 3, s/p renal transplant on chronic immunosuppressive therapy -Patient baseline creatinine previously noted to be around 1.5, but presents with a creatinine of 2.63 and BUN 50 on admission  Elevated BUN to creatinine ratio suggest prerenal cause of symptoms.  -BUN/Cr now worsening and Cr is 2.9 -Nephrology Dr. Georgiann Hahn for further evaluation and recommendations and appreciate additional recc's -Check Prograf level (pending) -Continue CellCept, Prograf, prednisone -IV NS changed to Isotonic Bicarbonate by Nephrology at 100 mL/hr -Renal U/S pending  -CT Abd/Pelvis showed Atrophic native kidneys with unremarkable appearing transplant kidney in RIGHT iliac fossa. -Strict I's/O's, Daily Weights -Nursing notified patient was not urinating; Bladder Scan done and 0 mL in Bladder; Essentially oliguric -Check FENa and Urine Lytes  -U/A showed showed Amber Color Urine, Small Hb, >300 Protein; WBC was 6-30 -Placed Foley Catheter for accurate I's/O's -Repeat CMP in AM and defer to Nephro for further recc's  Diabetes mellitus type 1 with hyperglycemia, uncontrolled -Patient's last hemoglobin A1c noted to be 9.9 on 04/2017.  Initial glucose 363 without elevated anion gap. -C/w Hypoglycemic  protocol -Continue home insulin regimen -CBGs q. before meals and at bedtime with moderate SSI -Diabetic Education Coordinator Consulted  -CBG's ranging from 99-266  Anemia of chronic disease:  -Hemoglobin appears to be near patient's baseline of 8-9 g/dL -Hb/Hct went from 9.6/31.6 -> 8.4/27.4 -Iron Studies being checked by Nephrology -Continue to Monitor for S/Sx of Bleeding as patient was just empirically heparinized  -Repeat CBC in AM  Essential Hypertension -Continue Coreg, hydralazine, and amlodipine as tolerated -Avoid Hypotension   Hypothyroidism -Previously noted to be not well controlled back in 2018. -Add on TSH was elevated at 8.857; Free T4 was 1.17 -Continue Synthroid at current dose -Repeat TSH and Free T4 in 5-6 weeks after acute illness   History of DVT -Patient with previous history of DVT but appears to have been provoked not on anticoagulation at this time. 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 Posey Pronto   Procedures: V/Q Scan   Antimicrobials:  Anti-infectives (From admission, onward)   Start     Dose/Rate Route Frequency Ordered Stop   11/05/17 2200  cefTRIAXone (ROCEPHIN) 1 g in sodium chloride 0.9 % 100 mL IVPB     1 g 200 mL/hr over 30 Minutes Intravenous Every 24 hours 11/05/17 0801     11/04/17 2245  cefTRIAXone (ROCEPHIN) 1 g in sodium chloride 0.9 % 100 mL IVPB     1 g 200 mL/hr over 30 Minutes Intravenous  Once 11/04/17 2242 11/05/17 0117     Subjective: Seen and examined at bedside and stated she was feeling ok. Stated legs were hurting and swollen. No CP or SOB. No Nausea or vomiting   Objective: Vitals:   11/04/17  2210 11/05/17 0129 11/05/17 0235 11/05/17 0523  BP: 107/61 110/60 (!) 115/59 102/61  Pulse: 74 77 75 73  Resp: '20 20 20 20  ' Temp: 98.4 F (36.9 C) 98.5 F (36.9 C) 98.3 F (36.8 C) 98.1 F (36.7 C)  TempSrc: Oral Oral Oral Oral  SpO2: 97% 98% 100% 91%  Weight:   82.4 kg (181 lb 9.6 oz)   Height:   '5\' 7"'  (1.702 m)     Intake/Output Summary (Last 24 hours) at 11/05/2017 0803 Last data filed at 11/05/2017 0600 Gross per 24 hour  Intake 338.42 ml  Output 0 ml  Net 338.42 ml   Filed Weights   11/05/17 0235  Weight: 82.4 kg (181 lb 9.6 oz)   Examination: Physical Exam:  Constitutional: WN/WD ill appearing Caucasian female in NAD and appears calm and comfortable Eyes: Lids and conjunctivae normal, sclerae anicteric  ENMT: External Ears, Nose appear normal. Grossly normal hearing. Mucous membranes are moist.  Neck: Appears normal, supple, no cervical masses, normal ROM, no appreciable thyromegaly, no JVD Respiratory: Diminished to auscultation bilaterally, no wheezing, rales, rhonchi or crackles. Normal respiratory effort and patient is not tachypenic.  Cardiovascular: RRR, no murmurs / rubs / gallops. S1 and S2 auscultated. 2+ LE pitting edema Abdomen: Soft, non-tender, non-distended. No masses palpated. No appreciable hepatosplenomegaly. Bowel sounds positive.  GU: Deferred. Musculoskeletal:  No clubbing / cyanosis of digits/nails. No joint deformity upper and lower extremities.  Skin: LE Erythema and Warm worse on Right compared to Left with swelling Neurologic: CN 2-12 grossly intact with no focal deficits. Romberg sign and cerebellar reflexes not assessed.  Psychiatric: Normal judgment and insight. Alert and awake. Normal mood and appropriate affect.   Data Reviewed: I have personally reviewed following labs and imaging studies  CBC: Recent Labs  Lab 11/04/17 1356 11/05/17 0257  WBC 7.0 8.7  NEUTROABS 6.2  --   HGB 9.6* 8.4*  HCT 31.6* 27.4*  MCV 92.1 91.9  PLT 129* 136*    Basic Metabolic Panel: Recent Labs  Lab 11/04/17 1356 11/05/17 0257  NA 135 137  K 4.4 4.8  CL 105 107  CO2 19* 17*  GLUCOSE 363* 368*  BUN 50* 63*  CREATININE 2.63* 2.90*  CALCIUM 8.3* 8.4*   GFR: Estimated Creatinine Clearance: 25.6 mL/min (A) (by C-G formula based on SCr of 2.9 mg/dL (H)). Liver Function Tests: Recent Labs  Lab 11/04/17 1356  AST 20  ALT 11*  ALKPHOS 86  BILITOT 1.3*  PROT 6.1*  ALBUMIN 2.1*   No results for input(s): LIPASE, AMYLASE in the last 168 hours. No results for input(s): AMMONIA in the last 168 hours. Coagulation Profile: Recent Labs  Lab 11/05/17 0257  INR 1.07   Cardiac Enzymes: No results for input(s): CKTOTAL, CKMB, CKMBINDEX, TROPONINI in the last 168 hours. BNP (last 3 results) No results for input(s): PROBNP in the last 8760 hours. HbA1C: No results for input(s): HGBA1C in the last 72 hours. CBG: Recent Labs  Lab 11/04/17 1346 11/04/17 2303 11/05/17 0254 11/05/17 0529 11/05/17 0737  GLUCAP 326* 383* 330* 266* 165*   Lipid Profile: No results for input(s): CHOL, HDL, LDLCALC, TRIG, CHOLHDL, LDLDIRECT in the last 72 hours. Thyroid Function Tests: No results for input(s): TSH, T4TOTAL, FREET4, T3FREE, THYROIDAB in the last 72 hours. Anemia Panel: No results for input(s): VITAMINB12, FOLATE, FERRITIN, TIBC, IRON, RETICCTPCT in the last 72 hours. Sepsis Labs: Recent Labs  Lab 11/04/17 1414  LATICACIDVEN 1.53    No results found for this or any previous visit (from the past 240 hour(s)).   Radiology Studies: No results found.  Scheduled Meds: . amLODipine  10 mg Oral QHS  . carvedilol  50 mg Oral BID  . feeding supplement (ENSURE ENLIVE)  237 mL Oral BID BM  . hydrALAZINE  25 mg Oral Q12H  . insulin aspart  0-15 Units Subcutaneous TID WC  . insulin aspart  0-5 Units Subcutaneous QHS  . insulin aspart protamine- aspart  20 Units Subcutaneous BID AC  . levothyroxine  137 mcg Oral QAC breakfast  . mouth  rinse  15 mL Mouth Rinse BID  . mycophenolate  500 mg Oral BID  . predniSONE  5 mg Oral Daily  . sodium bicarbonate  1,300 mg Oral BID  . tacrolimus  0.5 mg Oral QAC breakfast  . tacrolimus  1 mg Oral q1800   Continuous Infusions: . sodium chloride 100 mL/hr at 11/05/17 0431  . cefTRIAXone (ROCEPHIN)  IV    . heparin 1,300 Units/hr (11/05/17 0435)    LOS: 0 days   Kerney Elbe, DO [facility] Pager [phone]  If 7PM-7AM, please contact night-coverage [email or web address] Password [email or web address] 11/05/2017, 8:03 AM

## 2017-11-05 NOTE — Progress Notes (Signed)
ANTICOAGULATION CONSULT NOTE - Initial Consult  Pharmacy Consult for Heparin Indication: DVT  Allergies  Allergen Reactions  . Latex Itching  . Ace Inhibitors Cough  . Adhesive [Tape] Itching and Other (See Comments)    Please use paper tape  . Lisinopril Cough    Patient Measurements: Height: 5\' 7"  (170.2 cm) Weight: 181 lb 9.6 oz (82.4 kg) IBW/kg (Calculated) : 61.6 Heparin Dosing Weight: 78.6kg  Vital Signs: Temp: 98.1 F (36.7 C) (03/01 0523) Temp Source: Oral (03/01 0523) BP: 115/54 (03/01 1102) Pulse Rate: 71 (03/01 1102)  Labs: Recent Labs    11/04/17 1356 11/05/17 0257 11/05/17 1224  HGB 9.6* 8.4*  --   HCT 31.6* 27.4*  --   PLT 129* 136*  --   APTT  --  36  --   LABPROT  --  13.8  --   INR  --  1.07  --   HEPARINUNFRC  --   --  0.14*  CREATININE 2.63* 2.90*  --     Estimated Creatinine Clearance: 25.6 mL/min (A) (by C-G formula based on SCr of 2.9 mg/dL (H)).   Medical History: Past Medical History:  Diagnosis Date  . Arthritis    "elbows, knees, legs, back" (09/24/2015)  . CKD (chronic kidney disease)   . Daily headache   . Depression    "years ago"  . End stage renal disease (HCC)    right arm AV graft, resolved post transplant  . Hypertension   . Hypothyroid   . 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 Kidney disease 2011  . Pneumonia ~ 2007?  [name]. Type II diabetes mellitus (HCC)    Insulin dependant    Medications:  Infusions:  . sodium chloride 100 mL/hr at 11/05/17 1030  . cefTRIAXone (ROCEPHIN)  IV    . heparin      Assessment: 52 yo female with hx DVT not currently on anticoagulation admitted 2/28 with worsening bilateral lower extremity pain/swelling.  D-dimer elevated 4.1, Pharmacy consulted to dose IV heparin.  Today, 11/05/2017  Heparin level subtherapeutic (0.14) on 1300 units/hr  CBC: Hgb decreased 8.4, Plts 136k  No complications or bleeding reported per discussion with RN  Preliminary LE venous  duplex negative for DVT  Goal of Therapy:  Heparin level 0.3-0.7 units/ml Monitor platelets by anticoagulation protocol: Yes   Plan:  Heparin rebolus 1000 units iv x1 Increase Heparin drip to 1500 units/hr Recheck heparin level in 8hr Daily CBC  Loralee PacasErin  Carmack, PharmD, BCPS Pager: [phone] 11/05/2017,1:12 PM

## 2017-11-06 ENCOUNTER — Inpatient Hospital Stay (HOSPITAL_COMMUNITY): Payer: Medicare Other

## 2017-11-06 DIAGNOSIS — R739 Hyperglycemia, unspecified: Secondary | ICD-10-CM

## 2017-11-06 LAB — COMPREHENSIVE METABOLIC PANEL
ALT: 9 U/L — AB (ref 14–54)
AST: 12 U/L — AB (ref 15–41)
Albumin: 1.7 g/dL — ABNORMAL LOW (ref 3.5–5.0)
Alkaline Phosphatase: 79 U/L (ref 38–126)
Anion gap: 10 (ref 5–15)
BUN: 58 mg/dL — AB (ref 6–20)
CHLORIDE: 106 mmol/L (ref 101–111)
CO2: 24 mmol/L (ref 22–32)
CREATININE: 2.42 mg/dL — AB (ref 0.44–1.00)
Calcium: 8.6 mg/dL — ABNORMAL LOW (ref 8.9–10.3)
GFR calc Af Amer: 26 mL/min — ABNORMAL LOW (ref >60)
GFR calc non Af Amer: 22 mL/min — ABNORMAL LOW (ref >60)
GLUCOSE: 120 mg/dL — AB (ref 65–99)
Potassium: 3.9 mmol/L (ref 3.5–5.1)
SODIUM: 140 mmol/L (ref 135–145)
Total Bilirubin: 0.5 mg/dL (ref 0.3–1.2)
Total Protein: 5 g/dL — ABNORMAL LOW (ref 6.5–8.1)

## 2017-11-06 LAB — CBC WITH DIFFERENTIAL/PLATELET
BASOS ABS: 0 10*3/uL (ref 0.0–0.1)
Basophils Relative: 0 %
EOS ABS: 0 10*3/uL (ref 0.0–0.7)
EOS PCT: 0 %
HCT: 29.2 % — ABNORMAL LOW (ref 36.0–46.0)
HEMOGLOBIN: 9.1 g/dL — AB (ref 12.0–15.0)
LYMPHS PCT: 7 %
Lymphs Abs: 0.5 10*3/uL — ABNORMAL LOW (ref 0.7–4.0)
MCH: 28.6 pg (ref 26.0–34.0)
MCHC: 31.2 g/dL (ref 30.0–36.0)
MCV: 91.8 fL (ref 78.0–100.0)
Monocytes Absolute: 0.6 10*3/uL (ref 0.1–1.0)
Monocytes Relative: 8 %
Neutro Abs: 6.6 10*3/uL (ref 1.7–7.7)
Neutrophils Relative %: 85 %
PLATELETS: 128 10*3/uL — AB (ref 150–400)
RBC: 3.18 MIL/uL — AB (ref 3.87–5.11)
RDW: 14.9 % (ref 11.5–15.5)
WBC: 7.7 10*3/uL (ref 4.0–10.5)

## 2017-11-06 LAB — MAGNESIUM: Magnesium: 1.9 mg/dL (ref 1.7–2.4)

## 2017-11-06 LAB — FERRITIN: Ferritin: 333 ng/mL — ABNORMAL HIGH (ref 11–307)

## 2017-11-06 LAB — T3: T3, Total: 56 ng/dL — ABNORMAL LOW (ref 71–180)

## 2017-11-06 LAB — GLUCOSE, CAPILLARY
GLUCOSE-CAPILLARY: 172 mg/dL — AB (ref 65–99)
GLUCOSE-CAPILLARY: 128 mg/dL — AB (ref 65–99)
Glucose-Capillary: 105 mg/dL — ABNORMAL HIGH (ref 65–99)
Glucose-Capillary: 60 mg/dL — ABNORMAL LOW (ref 65–99)
Glucose-Capillary: 95 mg/dL (ref 65–99)

## 2017-11-06 LAB — IRON AND TIBC
Iron: 5 ug/dL — ABNORMAL LOW (ref 28–170)
SATURATION RATIOS: 4 % — AB (ref 10.4–31.8)
TIBC: 122 ug/dL — AB (ref 250–450)
UIBC: 117 ug/dL

## 2017-11-06 LAB — PHOSPHORUS: Phosphorus: 3.8 mg/dL (ref 2.5–4.6)

## 2017-11-06 MED ORDER — TRIAMCINOLONE 0.1 % CREAM:EUCERIN CREAM 1:1
TOPICAL_CREAM | Freq: Two times a day (BID) | CUTANEOUS | Status: DC | PRN
  Administered 2017-11-06: 18:00:00 via TOPICAL
  Filled 2017-11-06: qty 1

## 2017-11-06 MED ORDER — DIPHENHYDRAMINE HCL 50 MG/ML IJ SOLN: 12.5000 mg | Freq: Three times a day (TID) | INTRAMUSCULAR | Status: DC | PRN

## 2017-11-06 NOTE — Progress Notes (Signed)
Patient ID: NATHALYA WOLANSKI, female   DOB: [birth year], 52 y.o.   MRN: [id number]  [facility] Progress Note   Assessment/ Plan:   1.  Acute kidney injury on chronic kidney disease stage III T: Most likely hemodynamically mediated with what appears to be cellulitis and possibly may have a problem into ATN based on data so far.  Ultrasound of the renal allograft did not show any obstruction or obvious structural lesions.  Overnight, creatinine/urine output better with intravenous fluids-we will give an additional liter of fluids and continue to monitor for renal recovery. 2.  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 3.  Bilateral leg swelling/redness: Clinically appears consistent with cellulitis without any evidence of DVT.  VQ scan negative for pulmonary embolus.  She is on intravenous ceftriaxone and does not have any features of sepsis-question if she needs MRI or noncontrast CT of the lower extremities to evaluate for possible abscess. 4.  Hypertension: Blood pressure is under good control at this time and may possibly need to decrease antihypertensive dosing if she develops hypotension/renal hypoperfusion. 5.  Anemia: Secondary to chronic kidney disease, iron studies pending  Subjective:   Continues to complain of bilateral leg pain/tenderness without any chest pain or shortness of breath.   Objective:   BP 128/66 (BP Location: Right Arm)   Pulse 71   Temp 98.1 F (36.7 C) (Oral)   Resp 18   Ht 5\' 7"  (1.702 m)   Wt 84.3 kg (185 lb 13.6 oz)   LMP 11/19/2011   SpO2 94%   BMI 29.11 kg/m   Intake/Output Summary (Last 24 hours) at 11/06/2017 1234 Last data filed at 11/06/2017 0600 Gross per 24 hour  Intake 1557.67 ml  Output 1200 ml  Net 357.67 ml   Weight change: 1.927 kg (4 lb 4 oz)  Physical Exam: Gen: Appears to be comfortably  eating lunch CVS: Pulse regular rhythm, normal rate, S1 and S2 with ejection systolic murmur Resp: Clear to auscultation, no rales/rhonchi Abd: Soft, obese, nontender Ext: Trace-1+ edema of the lower extremities with erythema/tenderness consistent with cellulitis  Imaging: Ct Abdomen Pelvis Wo Contrast  Result Date: 11/05/2017 CLINICAL DATA:  BILATERAL flank and back pain question kidney stone, history of renal transplant, diabetes mellitus, former smoker EXAM: CT ABDOMEN AND PELVIS WITHOUT CONTRAST TECHNIQUE: Multidetector CT imaging of the abdomen and pelvis was performed following the standard protocol without IV contrast. Sagittal and coronal MPR images reconstructed from axial data set. COMPARISON:  04/10/2016 FINDINGS: Lower chest: Subsegmental atelectasis at LEFT base. Hepatobiliary: Focal fatty infiltration of the liver. Otherwise normal appearance of liver and gallbladder. Pancreas: Atrophic, grossly unremarkable Spleen: Normal appearance Adrenals/Urinary Tract: Thickening of adrenal glands without discrete mass. Markedly atrophic native kidneys. Transplant kidney RIGHT iliac fossa without gross mass or hydronephrosis. Transplant ureter and bladder unremarkable. Stomach/Bowel: Stomach and bowel loops unremarkable appearance for technique. Vascular/Lymphatic: Extensive atherosclerotic calcifications of both abdominal aorta and multiple branch vessels. Aorta normal caliber. No adenopathy. Reproductive: Normal appearing uterus and adnexa Other: No free air or free fluid. Diffuse soft tissue edema both of abdominal wall and intra-abdominal planes question related to fluid overload or hypoproteinemia. Tiny umbilical hernia containing fat. Musculoskeletal: Diffusely demineralized. Degenerative disc disease changes lumbar spine. Cannulated screws at proximal RIGHT femur. IMPRESSION: No acute intra-abdominal or intrapelvic abnormalities. Atrophic native kidneys with unremarkable appearing transplant kidney  in RIGHT iliac fossa. Scattered soft tissue edema  question related to fluid overload or hypoproteinemia. Electronically Signed   By: Ulyses Southward M.D.   On: 11/05/2017 16:28   US Renal  Result Date: 11/05/2017 CLINICAL DATA:  Acute kidney injury, transplanted kidney EXAM: ULTRASOUND OF RENAL TRANSPLANT TECHNIQUE: Ultrasound examination of the renal transplant was performed with gray-scale and color Doppler evaluation. COMPARISON:  CT 11/05/2017, ultrasound 06/05/2015 FINDINGS: Transplant kidney location:  Right lower quadrant Transplant kidney description: Length: 12 cm. Within normal limits in parenchymal echogenicity. No evidence of mass or hydronephrosis. No peri-transplant fluid collection seen. Color flow in the main renal artery at the hilum:  Visualized Color flow in the main renal vein at the hilum:  Visualized Bladder: Foley catheter in the bladder. Other findings: Atrophic echogenic native right kidney. Poorly visible native left kidney. IMPRESSION: Negative ultrasound appearance of the right lower quadrant transplant kidney. Electronically Signed   By: Jasmine Pang M.D.   On: 11/05/2017 21:35   Nm Pulmonary Perf And Vent  Result Date: 11/05/2017 CLINICAL DATA:  Elevated D-dimer, DVT in BILATERAL lower extremities, smoker EXAM: NUCLEAR MEDICINE VENTILATION - PERFUSION LUNG SCAN TECHNIQUE: Ventilation images were obtained in multiple projections using inhaled aerosol Tc-15m DTPA. Perfusion images were obtained in multiple projections after intravenous injection of Tc-51m-MAA. RADIOPHARMACEUTICALS:  32.4 mCi of Tc-20m DTPA aerosol inhalation and 4.36 mCi Tc53m-MAA IV COMPARISON:  None. FINDINGS: Ventilation: Central airway deposition of aerosol. Slightly diminished diffuse peripheral distribution of aerosol. No definite focal ventilation defects. Perfusion: Normal IMPRESSION: Normal perfusion lung scan. Electronically Signed   By: Ulyses Southward M.D.   On: 11/05/2017 15:53   Dg Chest Port 1  View  Result Date: 11/06/2017 CLINICAL DATA:  52 year old female admitted with progressive lower extremity edema, pain and erythema. Acute on chronic renal failure status post renal transplant. EXAM: PORTABLE CHEST 1 VIEW COMPARISON:  CT Abdomen and Pelvis 11/05/2017. 09/01/2017 and earlier. FINDINGS: Portable AP semi upright view at 0436 hours. Mild elevation of the right hemidiaphragm. Allowing for portable technique the lungs are clear. Mediastinal contours remain normal. Visualized tracheal air column is within normal limits. No acute osseous abnormality identified. IMPRESSION: No acute cardiopulmonary abnormality. Electronically Signed   By: Odessa Fleming M.D.   On: 11/06/2017 06:41    Labs: BMET Recent Labs  Lab 11/04/17 1356 11/05/17 0257 11/06/17 0515  NA 135 137 140  K 4.4 4.8 3.9  CL 105 107 106  CO2 19* 17* 24  GLUCOSE 363* 368* 120*  BUN 50* 63* 58*  CREATININE 2.63* 2.90* 2.42*  CALCIUM 8.3* 8.4* 8.6*  PHOS  --   --  3.8   CBC Recent Labs  Lab 11/04/17 1356 11/05/17 0257 11/06/17 0515  WBC 7.0 8.7 7.7  NEUTROABS 6.2  --  6.6  HGB 9.6* 8.4* 9.1*  HCT 31.6* 27.4* 29.2*  MCV 92.1 91.9 91.8  PLT 129* 136* 128*    Medications:    . amLODipine  10 mg Oral QHS  . carvedilol  50 mg Oral BID  . Chlorhexidine Gluconate Cloth  6 each Topical Q0600  . heparin injection (subcutaneous)  5,000 Units Subcutaneous Q8H  . hydrALAZINE  25 mg Oral Q12H  . insulin aspart  0-15 Units Subcutaneous TID WC  . insulin aspart  0-5 Units Subcutaneous QHS  . insulin aspart protamine- aspart  20 Units Subcutaneous BID AC  . levothyroxine  137 mcg Oral QAC breakfast  . mouth rinse  15 mL Mouth Rinse BID  . multivitamin with minerals  1 tablet Oral  Daily  . mupirocin ointment  1 application Nasal BID  . mupirocin ointment   Topical BID  . mycophenolate  500 mg Oral BID  . predniSONE  5 mg Oral Daily  . protein supplement shake  11 oz Oral BID BM  . sodium bicarbonate  1,300 mg Oral BID   . tacrolimus  0.5 mg Oral QAC breakfast  . tacrolimus  1 mg Oral q1800   Zetta BillsJay Toby Ayad, MD 11/06/2017, 12:34 PM

## 2017-11-06 NOTE — Progress Notes (Signed)
PROGRESS NOTE    [name]  VHQ:[id number] DOB: 1966-04-22 DOA: 11/04/2017 PCP: Hayden Rasmussen, MD   Brief Narrative:  HPI per Dr. Tamala Julian: [name] is a 52 y.o. female with medical history significant of HTN, hypothyroidism, s/p renal transplant on chronic immunosuppressive therapy, DM type I, depression, history of DVT not currently on anticoagulation; who presented with complaints of worsening bilateral lower extremity pain swelling and redness over the last several days.  She reports having chronic lower extremity edema that fluctuates, but noticed over the last 3-4 days symptoms progressively worsened.  Notes developing redness and increased warmth of both lower legs, but worse on the right.  Tried taking Tylenol without relief of pain.  Pain was so severe that she was unable to ambulate and felt that she may fall if she tried to stand up.  Denies having any recent trauma or scrapes to the leg, chest pain, fever, black/bloody stools, or dysuria.  She also complained of persistently elevated blood sugars into the 300s.  ED Course: Upon admission to the emergency department patient was noted to have stable vital signs.  Labs revealed WBC 7, hemoglobin 9.6, platelets 129, BUN 50, creatinine 2.63, glucose 363,  and lactic acid 1.53.  Patient was given 500 mL of normal saline IV fluid and Rocephin.  TRH called to admit.  *Patient felt ok and states swelling in Legs started worsening the last few weeks. Nursing informed has not urinated since 2:00 AM so foley was placed. Urine Outpatient improved. This AM she was noticed to have a significant splotchy rash in LE. Was given Benadyrl and Triamcinalone Cream.   Assessment & Plan:   Principal Problem:   Localized swelling of lower extremity Active Problems:   Immunosuppression ([place])   Hypothyroidism   H/O kidney transplant   Essential hypertension   Uncontrolled type 1 diabetes mellitus with foot ulcer (HCC)   Anemia of chronic  disease  Bilateral Lower extremity edema (cellulitis vs right lower extremity DVT):  -Acute. Given patient previous history of DVT high suspicion for recurrence. -Placed on empiric antibiotics of Rocephin for the possibility also of cellulitis and will continue for now. -Admitted a telemetry bed -Continue with Rocephin -Check D-Dimer and was 4.10, ESR was 100, CRP was 32.3 -Checked Doppler ultrasound of lower extremities and did not show acute DVT -Heparin gtt started and dosed per pharmacy but discontinued as VQ Scan was Normal and LE DVT Negative -VQ Scan done given Elevated D-Dimer of 4 but was Normal -CT Abd/Pelvis done and showed no mass or intraabdominal or intrapevlic abnormalities -Unfortunately cannot get CT Abd/Pelvis with Contrast due to Renal Fxn to evaluate for ? Iliac Blockages -Will get CT of Legs w/o Contrast to evaluate  -May discuss with Vascular and touch base with Hematology for further recc's   Acute renal failure on chronic kidney disease Stage 3, s/p renal transplant on chronic immunosuppressive therapy, improving  -Patient baseline creatinine previously noted to be around 1.5, but presents with a creatinine of 2.63 and BUN 50 on admission  Elevated BUN to creatinine ratio suggest prerenal cause of symptoms.  -BUN/Cr now improved to 2.48 -Nephrology Dr. Georgiann Hahn for further evaluation and recommendations and appreciate additional recc's -Check Prograf level (pending) -Continue CellCept, Prograf, Prednisone -IV NS changed to Isotonic Bicarbonate by Nephrology at 100 mL/hr -Renal U/S Negative ultrasound appearance of the right lower quadrant transplant kidney. -CT Abd/Pelvis showed Atrophic native kidneys with unremarkable appearing transplant kidney in RIGHT iliac fossa. -Strict  I's/O's, Daily Weights -Check FENa and Urine Lytes  -U/A showed showed Amber Color Urine, Small Hb, >300 Protein; WBC was 6-30 -Placed Foley Catheter for accurate I's/O's -Repeat CMP  in AM and defer to Nephro for further recc's  Diabetes mellitus type 1 with hyperglycemia, uncontrolled -Patient's last hemoglobin A1c noted to be 9.9 on 04/2017.  Initial glucose 363 without elevated anion gap. -C/w Hypoglycemic protocol -Continue home insulin regimen -CBGs q. before meals and at bedtime with moderate SSI -Diabetic Education Coordinator Consulted  -CBG's ranging from 60=172  Anemia of chronic disease:  -Hemoglobin appears to be near patient's baseline of 8-9 g/dL -Hb/Hct went from 9.6/31.6 -> 8.4/27.4 -> 9.1/29.2 -Iron Studies being checked by Nephrology and showed Iron Level of 5, UIBC of 117, TIBC of 122, Saturation of 4, Ferritin 333 -Continue to Monitor for S/Sx of Bleeding as patient was just empirically heparinized  -Repeat CBC in AM  Essential Hypertension -Continue Coreg, hydralazine, and amlodipine as tolerated -Avoid Hypotension   Hypothyroidism -Previously noted to be not well controlled back in 2018. -Add on TSH was elevated at 8.857; Free T4 was 1.17 -Continue Synthroid at current dose -Repeat TSH and Free T4 in 5-6 weeks after acute illness   History of DVT -Patient with previous history of DVT but appears to have been provoked not on anticoagulation at this time. 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 Posey Pronto   Procedures: V/Q Scan; CT of LE Extremities   Antimicrobials:  Anti-infectives (From admission, onward)   Start     Dose/Rate  Route Frequency Ordered Stop   11/05/17 2200  cefTRIAXone (ROCEPHIN) 1 g in sodium chloride 0.9 % 100 mL IVPB     1 g 200 mL/hr over 30 Minutes Intravenous Every 24 hours 11/05/17 0801     11/04/17 2245  cefTRIAXone (ROCEPHIN) 1 g in sodium chloride 0.9 % 100 mL IVPB     1 g 200 mL/hr over 30 Minutes Intravenous  Once 11/04/17 2242 11/05/17 0117     Subjective: Seen and examined at bedside and stated legs were hurting today. No CP or SOB. Had some nausea. Stated legs started itching and had a rash bilaterally. In medial and lateral thighs.   Objective: Vitals:   11/06/17 0504 11/06/17 0522 11/06/17 0915 11/06/17 1249  BP: (!) 113/53  128/66 125/60  Pulse: 73  71 74  Resp: 18   18  Temp: 98.1 F (36.7 C)   98.3 F (36.8 C)  TempSrc: Oral   Oral  SpO2:  94%  93%  Weight: 84.3 kg (185 lb 13.6 oz)     Height:        Intake/Output Summary (Last 24 hours) at 11/06/2017 2041 Last data filed at 11/06/2017 1250 Gross per 24 hour  Intake 1541.67 ml  Output 775 ml  Net 766.67 ml   Filed Weights   11/05/17 0235 11/06/17 0504  Weight: 82.4 kg (181 lb 9.6 oz) 84.3 kg (185 lb 13.6 oz)   Examination: Physical Exam:  Constitutional:  WN/WD ill appearing Caucasian female in NAD but complaining of severe LE Pain Eyes: Sclerae anicteric. Lids normal ENMT: External Ears and nose appear normal Neck: Supple with no JVD Respiratory: Diminished to auscultation bilaterally; No appreciable wheezing/rales/rhonchi Cardiovascular: RRR; No m/r/g. 2+ LE Edema Abdomen: Soft, NT, ND. Bowel sounds present GU: Deferred Musculoskeletal: No Clubbing no cyanosis Skin: LE Erythema and warmth with a Wound on Right Leg. Had a splotchy rash on medial thighs and lateral thighs; Has multiple tattoos  Neurologic: CN 2-12 grossly intact. Romberg sign and cerebellar reflexes not assessed Psychiatric: Normal mood and affect. Awake and alert  Data Reviewed: I have personally reviewed following labs and imaging  studies  CBC: Recent Labs  Lab 11/04/17 1356 11/05/17 0257 11/06/17 0515  WBC 7.0 8.7 7.7  NEUTROABS 6.2  --  6.6  HGB 9.6* 8.4* 9.1*  HCT 31.6* 27.4* 29.2*  MCV 92.1 91.9 91.8  PLT 129* 136* 751*   Basic Metabolic Panel: Recent Labs  Lab 11/04/17 1356 11/05/17 0257 11/06/17 0515  NA 135 137 140  K 4.4 4.8 3.9  CL 105 107 106  CO2 19* 17* 24  GLUCOSE 363* 368* 120*  BUN 50* 63* 58*  CREATININE 2.63* 2.90* 2.42*  CALCIUM 8.3* 8.4* 8.6*  MG  --   --  1.9  PHOS  --   --  3.8   GFR: Estimated Creatinine Clearance: 31 mL/min (A) (by C-G formula based on SCr of 2.42 mg/dL (H)). Liver Function Tests: Recent Labs  Lab 11/04/17 1356 11/06/17 0515  AST 20 12*  ALT 11* 9*  ALKPHOS 86 79  BILITOT 1.3* 0.5  PROT 6.1* 5.0*  ALBUMIN 2.1* 1.7*   No results for input(s): LIPASE, AMYLASE in the last 168 hours. No results for input(s): AMMONIA in the last 168 hours. Coagulation Profile: Recent Labs  Lab 11/05/17 0257  INR 1.07   Cardiac Enzymes: No results for input(s): CKTOTAL, CKMB, CKMBINDEX, TROPONINI in the last 168 hours. BNP (last 3 results) No results for input(s): PROBNP in the last 8760 hours. HbA1C: No results for input(s): HGBA1C in the last 72 hours. CBG: Recent Labs  Lab 11/05/17 2101 11/06/17 0740 11/06/17 1121 11/06/17 1206 11/06/17 1620  GLUCAP 125* 105* 60* 95 172*   Lipid Profile: Recent Labs    11/05/17 1224  CHOL 114  HDL 28*  LDLCALC 49  TRIG 187*  CHOLHDL 4.1   Thyroid Function Tests: Recent Labs    11/05/17 0613 11/05/17 1224  TSH 8.857*  --   FREET4  --  1.17*   Anemia Panel: Recent Labs    11/06/17 0515  FERRITIN 333*  TIBC 122*  IRON 5*   Sepsis Labs: Recent Labs  Lab 11/04/17 1414  LATICACIDVEN 1.53    Recent Results (from the past 240 hour(s))  MRSA PCR Screening     Status: Abnormal   Collection Time: 11/05/17  3:19 AM  Result Value Ref Range Status   MRSA by PCR POSITIVE (A) NEGATIVE Final     Comment:        The GeneXpert MRSA Assay (FDA approved for NASAL specimens only), is one component of a comprehensive MRSA colonization surveillance program. It is not intended to diagnose MRSA infection nor to guide or monitor treatment for MRSA infections. RESULT CALLED TO, READ BACK BY AND VERIFIED WITH: HOLT,B @ 0258 ON [id number] BY POTEAT,S Performed at [facility] [address], [place], [place] [address]      Radiology Studies: Ct  Abdomen Pelvis Wo Contrast  Result Date: 11/05/2017 CLINICAL DATA:  BILATERAL flank and back pain question kidney stone, history of renal transplant, diabetes mellitus, former smoker EXAM: CT ABDOMEN AND PELVIS WITHOUT CONTRAST TECHNIQUE: Multidetector CT imaging of the abdomen and pelvis was performed following the standard protocol without IV contrast. Sagittal and coronal MPR images reconstructed from axial data set. COMPARISON:  04/10/2016 FINDINGS: Lower chest: Subsegmental atelectasis at LEFT base. Hepatobiliary: Focal fatty infiltration of the liver. Otherwise normal appearance of liver and gallbladder. Pancreas: Atrophic, grossly unremarkable Spleen: Normal appearance Adrenals/Urinary Tract: Thickening of adrenal glands without discrete mass. Markedly atrophic native kidneys. Transplant kidney RIGHT iliac fossa without gross mass or hydronephrosis. Transplant ureter and bladder unremarkable. Stomach/Bowel: Stomach and bowel loops unremarkable appearance for technique. Vascular/Lymphatic: Extensive atherosclerotic calcifications of both abdominal aorta and multiple branch vessels. Aorta normal caliber. No adenopathy. Reproductive: Normal appearing uterus and adnexa Other: No free air or free fluid. Diffuse soft tissue edema both of abdominal wall and intra-abdominal planes question related to fluid overload or hypoproteinemia. Tiny umbilical hernia containing fat. Musculoskeletal: Diffusely demineralized. Degenerative disc disease changes  lumbar spine. Cannulated screws at proximal RIGHT femur. IMPRESSION: No acute intra-abdominal or intrapelvic abnormalities. Atrophic native kidneys with unremarkable appearing transplant kidney in RIGHT iliac fossa. Scattered soft tissue edema question related to fluid overload or hypoproteinemia. Electronically Signed   By: Lavonia Dana M.D.   On: 11/05/2017 16:28   US Renal  Result Date: 11/05/2017 CLINICAL DATA:  Acute kidney injury, transplanted kidney EXAM: ULTRASOUND OF RENAL TRANSPLANT TECHNIQUE: Ultrasound examination of the renal transplant was performed with gray-scale and color Doppler evaluation. COMPARISON:  CT 11/05/2017, ultrasound 06/05/2015 FINDINGS: Transplant kidney location:  Right lower quadrant Transplant kidney description: Length: 12 cm. Within normal limits in parenchymal echogenicity. No evidence of mass or hydronephrosis. No peri-transplant fluid collection seen. Color flow in the main renal artery at the hilum:  Visualized Color flow in the main renal vein at the hilum:  Visualized Bladder: Foley catheter in the bladder. Other findings: Atrophic echogenic native right kidney. Poorly visible native left kidney. IMPRESSION: Negative ultrasound appearance of the right lower quadrant transplant kidney. Electronically Signed   By: Donavan Foil M.D.   On: 11/05/2017 21:35   Nm Pulmonary Perf And Vent  Result Date: 11/05/2017 CLINICAL DATA:  Elevated D-dimer, DVT in BILATERAL lower extremities, smoker EXAM: NUCLEAR MEDICINE VENTILATION - PERFUSION LUNG SCAN TECHNIQUE: Ventilation images were obtained in multiple projections using inhaled aerosol Tc-11mDTPA. Perfusion images were obtained in multiple projections after intravenous injection of Tc-962mAA. RADIOPHARMACEUTICALS:  32.4 mCi of Tc-9957mPA aerosol inhalation and 4.36 mCi Tc99m87m IV COMPARISON:  None. FINDINGS: Ventilation: Central airway deposition of aerosol. Slightly diminished diffuse peripheral distribution of aerosol. No  definite focal ventilation defects. Perfusion: Normal IMPRESSION: Normal perfusion lung scan. Electronically Signed   By: MarkLavonia Dana.   On: 11/05/2017 15:53   Dg Chest Port 1 View  Result Date: 11/06/2017 CLINICAL DATA:  51 y56r old female admitted with progressive lower extremity edema, pain and erythema. Acute on chronic renal failure status post renal transplant. EXAM: PORTABLE CHEST 1 VIEW COMPARISON:  CT Abdomen and Pelvis 11/05/2017. 09/01/2017 and earlier. FINDINGS: Portable AP semi upright view at 0436 hours. Mild elevation of the right hemidiaphragm. Allowing for portable technique the lungs are clear. Mediastinal contours remain normal. Visualized tracheal air column is within normal limits. No acute osseous abnormality identified. IMPRESSION: No acute cardiopulmonary abnormality. Electronically Signed   By: H  HHerminio Heads  On: 11/06/2017 06:41    Scheduled Meds: . amLODipine  10 mg Oral QHS  . carvedilol  50 mg Oral BID  . Chlorhexidine Gluconate Cloth  6 each Topical Q0600  . heparin injection (subcutaneous)  5,000 Units Subcutaneous Q8H  . hydrALAZINE  25 mg Oral Q12H  . insulin aspart  0-15 Units Subcutaneous TID WC  . insulin aspart  0-5 Units Subcutaneous QHS  . insulin aspart protamine- aspart  20 Units Subcutaneous BID AC  . levothyroxine  137 mcg Oral QAC breakfast  . mouth rinse  15 mL Mouth Rinse BID  . multivitamin with minerals  1 tablet Oral Daily  . mupirocin ointment  1 application Nasal BID  . mupirocin ointment   Topical BID  . mycophenolate  500 mg Oral BID  . predniSONE  5 mg Oral Daily  . protein supplement shake  11 oz Oral BID BM  . tacrolimus  0.5 mg Oral QAC breakfast  . tacrolimus  1 mg Oral q1800   Continuous Infusions: . cefTRIAXone (ROCEPHIN)  IV Stopped (11/05/17 2341)  .  sodium bicarbonate (isotonic) infusion in sterile water 100 mL/hr at 11/06/17 0239    LOS: 1 day   Kerney Elbe, DO [facility] Pager [phone]  If  7PM-7AM, please contact night-coverage [email or web address] Password [facility] 11/06/2017, 8:41 PM

## 2017-11-06 NOTE — Progress Notes (Signed)
Hypoglycemic Event  CBG: 60  Treatment:8 OzOJ   Symptoms: Pale  Follow-up CBG: Time:1200 CBG Result:95  Possible Reasons for Event: Inadequate meal intake  Comments/MD notified:pt alert and eating lunch at this time MD [facility]. Will cont to monitor. CBG Q4H    Sindy MessingSheyla C Danzel Marszalek

## 2017-11-07 ENCOUNTER — Inpatient Hospital Stay (HOSPITAL_COMMUNITY): Payer: Medicare Other

## 2017-11-07 DIAGNOSIS — Z940 Kidney transplant status: Secondary | ICD-10-CM

## 2017-11-07 DIAGNOSIS — L03116 Cellulitis of left lower limb: Secondary | ICD-10-CM

## 2017-11-07 DIAGNOSIS — D899 Disorder involving the immune mechanism, unspecified: Secondary | ICD-10-CM

## 2017-11-07 DIAGNOSIS — R601 Generalized edema: Secondary | ICD-10-CM

## 2017-11-07 DIAGNOSIS — L97509 Non-pressure chronic ulcer of other part of unspecified foot with unspecified severity: Secondary | ICD-10-CM

## 2017-11-07 DIAGNOSIS — Z7952 Long term (current) use of systemic steroids: Secondary | ICD-10-CM

## 2017-11-07 DIAGNOSIS — Z87891 Personal history of nicotine dependence: Secondary | ICD-10-CM

## 2017-11-07 DIAGNOSIS — J029 Acute pharyngitis, unspecified: Secondary | ICD-10-CM

## 2017-11-07 DIAGNOSIS — L03115 Cellulitis of right lower limb: Secondary | ICD-10-CM

## 2017-11-07 DIAGNOSIS — Z79899 Other long term (current) drug therapy: Secondary | ICD-10-CM

## 2017-11-07 DIAGNOSIS — E10621 Type 1 diabetes mellitus with foot ulcer: Secondary | ICD-10-CM

## 2017-11-07 LAB — DIC (DISSEMINATED INTRAVASCULAR COAGULATION) PANEL
APTT: 35 s (ref 24–36)
D DIMER QUANT: 3.62 ug{FEU}/mL — AB (ref 0.00–0.50)
INR: 0.96
PLATELETS: 159 10*3/uL (ref 150–400)

## 2017-11-07 LAB — COMPREHENSIVE METABOLIC PANEL
ALBUMIN: 1.6 g/dL — AB (ref 3.5–5.0)
ALK PHOS: 83 U/L (ref 38–126)
ALT: 8 U/L — ABNORMAL LOW (ref 14–54)
AST: 13 U/L — AB (ref 15–41)
Anion gap: 10 (ref 5–15)
BUN: 51 mg/dL — ABNORMAL HIGH (ref 6–20)
CALCIUM: 8.3 mg/dL — AB (ref 8.9–10.3)
CO2: 27 mmol/L (ref 22–32)
Chloride: 100 mmol/L — ABNORMAL LOW (ref 101–111)
Creatinine, Ser: 2.14 mg/dL — ABNORMAL HIGH (ref 0.44–1.00)
GFR calc Af Amer: 30 mL/min — ABNORMAL LOW (ref >60)
GFR calc non Af Amer: 26 mL/min — ABNORMAL LOW (ref >60)
GLUCOSE: 151 mg/dL — AB (ref 65–99)
POTASSIUM: 3.4 mmol/L — AB (ref 3.5–5.1)
SODIUM: 137 mmol/L (ref 135–145)
Total Bilirubin: 0.5 mg/dL (ref 0.3–1.2)
Total Protein: 5 g/dL — ABNORMAL LOW (ref 6.5–8.1)

## 2017-11-07 LAB — RENAL FUNCTION PANEL
ALBUMIN: 1.5 g/dL — AB (ref 3.5–5.0)
Anion gap: 10 (ref 5–15)
BUN: 52 mg/dL — ABNORMAL HIGH (ref 6–20)
CALCIUM: 8.3 mg/dL — AB (ref 8.9–10.3)
CO2: 28 mmol/L (ref 22–32)
CREATININE: 2.12 mg/dL — AB (ref 0.44–1.00)
Chloride: 100 mmol/L — ABNORMAL LOW (ref 101–111)
GFR calc Af Amer: 30 mL/min — ABNORMAL LOW (ref >60)
GFR, EST NON AFRICAN AMERICAN: 26 mL/min — AB (ref >60)
Glucose, Bld: 152 mg/dL — ABNORMAL HIGH (ref 65–99)
PHOSPHORUS: 2.6 mg/dL (ref 2.5–4.6)
Potassium: 3.4 mmol/L — ABNORMAL LOW (ref 3.5–5.1)
SODIUM: 138 mmol/L (ref 135–145)

## 2017-11-07 LAB — CBC WITH DIFFERENTIAL/PLATELET
BASOS ABS: 0 10*3/uL (ref 0.0–0.1)
BASOS PCT: 0 %
EOS ABS: 0 10*3/uL (ref 0.0–0.7)
Eosinophils Relative: 0 %
HCT: 28.8 % — ABNORMAL LOW (ref 36.0–46.0)
HEMOGLOBIN: 9 g/dL — AB (ref 12.0–15.0)
Lymphocytes Relative: 10 %
Lymphs Abs: 0.7 10*3/uL (ref 0.7–4.0)
MCH: 28.5 pg (ref 26.0–34.0)
MCHC: 31.3 g/dL (ref 30.0–36.0)
MCV: 91.1 fL (ref 78.0–100.0)
MONOS PCT: 7 %
Monocytes Absolute: 0.5 10*3/uL (ref 0.1–1.0)
Neutro Abs: 6.1 10*3/uL (ref 1.7–7.7)
Neutrophils Relative %: 83 %
Platelets: 125 10*3/uL — ABNORMAL LOW (ref 150–400)
RBC: 3.16 MIL/uL — AB (ref 3.87–5.11)
RDW: 14.6 % (ref 11.5–15.5)
WBC: 7.4 10*3/uL (ref 4.0–10.5)

## 2017-11-07 LAB — DIC (DISSEMINATED INTRAVASCULAR COAGULATION)PANEL
Fibrinogen: 778 mg/dL — ABNORMAL HIGH (ref 210–475)
Prothrombin Time: 12.7 seconds (ref 11.4–15.2)
Smear Review: NONE SEEN

## 2017-11-07 LAB — GLUCOSE, CAPILLARY
GLUCOSE-CAPILLARY: 210 mg/dL — AB (ref 65–99)
GLUCOSE-CAPILLARY: 113 mg/dL — AB (ref 65–99)
Glucose-Capillary: 157 mg/dL — ABNORMAL HIGH (ref 65–99)
Glucose-Capillary: 50 mg/dL — ABNORMAL LOW (ref 65–99)
Glucose-Capillary: 61 mg/dL — ABNORMAL LOW (ref 65–99)
Glucose-Capillary: 185 mg/dL — ABNORMAL HIGH (ref 65–99)

## 2017-11-07 LAB — TACROLIMUS LEVEL: TACROLIMUS (FK506) - LABCORP: 3.2 ng/mL (ref 2.0–20.0)

## 2017-11-07 LAB — PHOSPHORUS: Phosphorus: 2.5 mg/dL (ref 2.5–4.6)

## 2017-11-07 LAB — MAGNESIUM: Magnesium: 1.8 mg/dL (ref 1.7–2.4)

## 2017-11-07 MED ORDER — INSULIN ASPART PROT & ASPART (70-30 MIX) 100 UNIT/ML ~~LOC~~ SUSP
15.0000 [IU] | Freq: Two times a day (BID) | SUBCUTANEOUS | Status: DC
  Administered 2017-11-08 (×2): 15 [IU] via SUBCUTANEOUS
  Administered 2017-11-09: 7 [IU] via SUBCUTANEOUS
  Administered 2017-11-10 (×2): 15 [IU] via SUBCUTANEOUS
  Filled 2017-11-07 (×2): qty 10

## 2017-11-07 MED ORDER — SODIUM CHLORIDE 0.9 % IV SOLN
2.0000 g | INTRAVENOUS | Status: DC
  Administered 2017-11-07 – 2017-11-10 (×4): 2 g via INTRAVENOUS
  Filled 2017-11-07 (×5): qty 2

## 2017-11-07 MED ORDER — POTASSIUM CHLORIDE CRYS ER 20 MEQ PO TBCR
20.0000 meq | EXTENDED_RELEASE_TABLET | Freq: Two times a day (BID) | ORAL | Status: AC
  Administered 2017-11-07 (×2): 20 meq via ORAL
  Filled 2017-11-07 (×2): qty 1

## 2017-11-07 MED ORDER — DIPHENHYDRAMINE HCL 50 MG/ML IJ SOLN: 12.5000 mg | Freq: Three times a day (TID) | INTRAMUSCULAR | Status: DC | PRN

## 2017-11-07 MED ORDER — PIPERACILLIN-TAZOBACTAM 3.375 G IVPB 30 MIN
3.3750 g | Freq: Once | INTRAVENOUS | Status: DC
  Filled 2017-11-07 (×2): qty 50

## 2017-11-07 MED ORDER — TRIAMCINOLONE 0.1 % CREAM:EUCERIN CREAM 1:1
TOPICAL_CREAM | Freq: Two times a day (BID) | CUTANEOUS | Status: DC | PRN
  Administered 2017-11-09: 21:00:00 via TOPICAL

## 2017-11-07 MED ORDER — NYSTATIN 100000 UNIT/GM EX POWD
Freq: Three times a day (TID) | CUTANEOUS | Status: DC
  Administered 2017-11-08 – 2017-11-11 (×10): via TOPICAL
  Administered 2017-11-11: 1 via TOPICAL
  Administered 2017-11-12: 08:00:00 via TOPICAL
  Filled 2017-11-07: qty 15

## 2017-11-07 NOTE — Progress Notes (Addendum)
R hip edematous, red, hot.  Pt reports hx of hip surgery in this area last Sept 2018. Dr. Marland McalpineSheikh informed.  MRI ordered. Will continue to monitor.

## 2017-11-07 NOTE — Progress Notes (Signed)
Patient ID: [name], female   DOB: 05-22-1966, 52 y.o.   MRN: [id number] [facility] Progress Note   Assessment/ Plan:   1.  Acute kidney injury on chronic kidney disease stage III T: Most likely hemodynamically mediated with what appears to be cellulitis and possibly evolved into ATN based on data so far.  Allograft ultrasound negative for obstruction.  Renal function appears to be improving on labs with fair urine output, plan to discontinue intravenous fluids at this time and continue to monitor on oral intake. 2.  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 3.  Bilateral leg cellulitis: Without any evidence of DVT/PE.  On ceftriaxone at this time which indeed might need to be expanded to include anaerobic coverage in this immunosuppressed patient given the lack of response that we have had so far-recommend input from infectious disease MD. 4.  Hypertension: Blood pressure is under good control at this time, will consider diuretics in the next 24 hours if renal function continues to improve as initial renal injury was suspected to be from volume depletion. 5.  Anemia: Secondary to chronic kidney disease.  Significantly low iron saturation noted however, apprehensive to give intravenous iron at this time given poorly controlled cellulitis/lower extremity infection. 6.  Hypokalemia: We will give a single dose of oral potassium supplement.  Subjective:   Continues to have pain/redness of her legs that now appears to be going up into her thighs.   Objective:   BP 137/60 (BP Location: Right Arm)   Pulse 77   Temp 98 F (36.7 C) (Oral)   Resp 20   Ht 5\' 7"  (1.702 m)   Wt 84.3 kg (185 lb 13.6 oz)   LMP 11/19/2011   SpO2 98%   BMI 29.11 kg/m   Intake/Output Summary (Last 24 hours) at 11/07/2017 1152 Last data filed at 11/07/2017  1000 Gross per 24 hour  Intake 220 ml  Output 1120 ml  Net -900 ml   Weight change:   Physical Exam: Gen: Appears to be comfortable resting in bed CVS: Pulse regular rhythm, normal rate, S1 and S2 with ejection systolic murmur Resp: Clear to auscultation, no rales/rhonchi Abd: Soft, obese, nontender Ext: Trace-1+ edema of the lower extremities with erythema/tenderness consistent with cellulitis with lacy erythematous rash involving her thighs  Imaging: Ct Abdomen Pelvis Wo Contrast  Result Date: 11/05/2017 CLINICAL DATA:  BILATERAL flank and back pain question kidney stone, history of renal transplant, diabetes mellitus, former smoker EXAM: CT ABDOMEN AND PELVIS WITHOUT CONTRAST TECHNIQUE: Multidetector CT imaging of the abdomen and pelvis was performed following the standard protocol without IV contrast. Sagittal and coronal MPR images reconstructed from axial data set. COMPARISON:  04/10/2016 FINDINGS: Lower chest: Subsegmental atelectasis at LEFT base. Hepatobiliary: Focal fatty infiltration of the liver. Otherwise normal appearance of liver and gallbladder. Pancreas: Atrophic, grossly unremarkable Spleen: Normal appearance Adrenals/Urinary Tract: Thickening of adrenal glands without discrete mass. Markedly atrophic native kidneys. Transplant kidney RIGHT iliac fossa without gross mass or hydronephrosis. Transplant ureter and bladder unremarkable. Stomach/Bowel: Stomach and bowel loops unremarkable appearance for technique. Vascular/Lymphatic: Extensive atherosclerotic calcifications of both abdominal aorta and multiple branch vessels. Aorta normal caliber. No adenopathy. Reproductive: Normal appearing uterus and adnexa Other: No free air or free fluid. Diffuse soft tissue edema both of abdominal wall and intra-abdominal planes question related to fluid overload or hypoproteinemia. Tiny umbilical hernia containing fat. Musculoskeletal: Diffusely  demineralized. Degenerative disc disease changes  lumbar spine. Cannulated screws at proximal RIGHT femur. IMPRESSION: No acute intra-abdominal or intrapelvic abnormalities. Atrophic native kidneys with unremarkable appearing transplant kidney in RIGHT iliac fossa. Scattered soft tissue edema question related to fluid overload or hypoproteinemia. Electronically Signed   By: Ulyses Southward M.D.   On: 11/05/2017 16:28   US Renal  Result Date: 11/05/2017 CLINICAL DATA:  Acute kidney injury, transplanted kidney EXAM: ULTRASOUND OF RENAL TRANSPLANT TECHNIQUE: Ultrasound examination of the renal transplant was performed with gray-scale and color Doppler evaluation. COMPARISON:  CT 11/05/2017, ultrasound 06/05/2015 FINDINGS: Transplant kidney location:  Right lower quadrant Transplant kidney description: Length: 12 cm. Within normal limits in parenchymal echogenicity. No evidence of mass or hydronephrosis. No peri-transplant fluid collection seen. Color flow in the main renal artery at the hilum:  Visualized Color flow in the main renal vein at the hilum:  Visualized Bladder: Foley catheter in the bladder. Other findings: Atrophic echogenic native right kidney. Poorly visible native left kidney. IMPRESSION: Negative ultrasound appearance of the right lower quadrant transplant kidney. Electronically Signed   By: Jasmine Pang M.D.   On: 11/05/2017 21:35   Nm Pulmonary Perf And Vent  Result Date: 11/05/2017 CLINICAL DATA:  Elevated D-dimer, DVT in BILATERAL lower extremities, smoker EXAM: NUCLEAR MEDICINE VENTILATION - PERFUSION LUNG SCAN TECHNIQUE: Ventilation images were obtained in multiple projections using inhaled aerosol Tc-73m DTPA. Perfusion images were obtained in multiple projections after intravenous injection of Tc-56m-MAA. RADIOPHARMACEUTICALS:  32.4 mCi of Tc-24m DTPA aerosol inhalation and 4.36 mCi Tc78m-MAA IV COMPARISON:  None. FINDINGS: Ventilation: Central airway deposition of aerosol. Slightly diminished diffuse peripheral distribution of aerosol. No  definite focal ventilation defects. Perfusion: Normal IMPRESSION: Normal perfusion lung scan. Electronically Signed   By: Ulyses Southward M.D.   On: 11/05/2017 15:53   Dg Chest Port 1 View  Result Date: 11/06/2017 CLINICAL DATA:  52 year old female admitted with progressive lower extremity edema, pain and erythema. Acute on chronic renal failure status post renal transplant. EXAM: PORTABLE CHEST 1 VIEW COMPARISON:  CT Abdomen and Pelvis 11/05/2017. 09/01/2017 and earlier. FINDINGS: Portable AP semi upright view at 0436 hours. Mild elevation of the right hemidiaphragm. Allowing for portable technique the lungs are clear. Mediastinal contours remain normal. Visualized tracheal air column is within normal limits. No acute osseous abnormality identified. IMPRESSION: No acute cardiopulmonary abnormality. Electronically Signed   By: Odessa Fleming M.D.   On: 11/06/2017 06:41   Ct Extrem Lower Wo Cm Bil  Result Date: 11/07/2017 CLINICAL DATA:  Worsening bilateral lower extremity pain swelling and redness over the last several days. She reports having chronic lower extremity edema that fluctuates, but noticed over the last 3-4 days symptoms progressively worsened. EXAM: CT OF THE LOWER BILATERAL EXTREMITY WITHOUT CONTRAST TECHNIQUE: Multidetector CT imaging of the lower bilateral extremity was performed according to the standard protocol. COMPARISON:  None. FINDINGS: Bones/Joint/Cartilage Generalized osteopenia. Nondisplaced fracture of the medial margin of the right femoral head neck transfixed with 3 cannulated screws. Mild osteoarthritis of the right hip. Normal alignment. No joint effusion. Knee joint spaces are maintained bilaterally. Ankle mortise is normal bilaterally. Subtalar joints are normal bilaterally. Mild osteoarthritis of bilateral sacroiliac joints. No aggressive osseous lesion. Severe degenerative disc disease with disc height loss at L4-5 and L5-S1 with severe bilateral facet arthropathy and foraminal  stenosis. Ligaments Ligaments are suboptimally evaluated by CT. Muscles and Tendons Mild generalized symmetric muscle atrophy bilaterally. No intramuscular fluid collection or hematoma. No intramuscular emphysema. Soft tissue  Mild soft tissue edema throughout bilateral lower extremities and pelvis most consistent with anasarca. Peripheral vascular atherosclerotic disease. 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 Foley catheter within the bladder. No soft tissue mass. IMPRESSION: 1. Mild soft tissue edema throughout bilateral lower extremities and moderate soft tissue edema in the subcutaneous fat of the pelvis and lower abdomen as can be seen with anasarca. 2. Extensive peripheral vascular atherosclerotic disease. 3. Nondisplaced fracture of the medial margin of the right femoral head neck transfixed with 3 cannulated screws. Electronically Signed   By: Elige KoHetal  Lateefa Crosby   On: 11/07/2017 10:06    Labs: BMET Recent Labs  Lab 11/04/17 1356 11/05/17 0257 11/06/17 0515 11/07/17 0531  NA 135 137 140 137  138  K 4.4 4.8 3.9 3.4*  3.4*  CL 105 107 106 100*  100*  CO2 19* 17* 24 27  28   GLUCOSE 363* 368* 120* 151*  152*  BUN 50* 63* 58* 51*  52*  CREATININE 2.63* 2.90* 2.42* 2.14*  2.12*  CALCIUM 8.3* 8.4* 8.6* 8.3*  8.3*  PHOS  --   --  3.8 2.5  2.6   CBC Recent Labs  Lab 11/04/17 1356 11/05/17 0257 11/06/17 0515 11/07/17 0531  WBC 7.0 8.7 7.7 7.4  NEUTROABS 6.2  --  6.6 6.1  HGB 9.6* 8.4* 9.1* 9.0*  HCT 31.6* 27.4* 29.2* 28.8*  MCV 92.1 91.9 91.8 91.1  PLT 129* 136* 128* 125*    Medications:    . amLODipine  10 mg Oral QHS  . carvedilol  50 mg Oral BID  . Chlorhexidine Gluconate Cloth  6 each Topical Q0600  . heparin injection (subcutaneous)  5,000 Units Subcutaneous Q8H  . hydrALAZINE  25 mg Oral Q12H  . insulin aspart  0-15 Units Subcutaneous TID WC  . insulin aspart  0-5 Units Subcutaneous QHS  . insulin aspart protamine- aspart  20 Units Subcutaneous BID AC  .  levothyroxine  137 mcg Oral QAC breakfast  . mouth rinse  15 mL Mouth Rinse BID  . multivitamin with minerals  1 tablet Oral Daily  . mupirocin ointment  1 application Nasal BID  . mupirocin ointment   Topical BID  . mycophenolate  500 mg Oral BID  . predniSONE  5 mg Oral Daily  . protein supplement shake  11 oz Oral BID BM  . tacrolimus  0.5 mg Oral QAC breakfast  . tacrolimus  1 mg Oral q1800   Zetta BillsJay Danielle Mink, MD 11/07/2017, 11:52 AM

## 2017-11-07 NOTE — Progress Notes (Signed)
Pharmacy Antibiotic Note  [name] is a 52 y.o. female admitted on 11/04/2017 with cellulitis.  Pharmacy has been consulted for cefepime dosing.  Plan: Cefepime 2g IV q24 per current renal function  Height: 5\' 7"  (170.2 cm) Weight: 185 lb 13.6 oz (84.3 kg) IBW/kg (Calculated) : 61.6  Temp (24hrs), Avg:98.3 F (36.8 C), Min:98 F (36.7 C), Max:98.6 F (37 C)  Recent Labs  Lab 11/04/17 1356 11/04/17 1414 11/05/17 0257 11/06/17 0515 11/07/17 0531  WBC 7.0  --  8.7 7.7 7.4  CREATININE 2.63*  --  2.90* 2.42* 2.14*  2.12*  LATICACIDVEN  --  1.53  --   --   --     Estimated Creatinine Clearance: 35.4 mL/min (A) (by C-G formula based on SCr of 2.12 mg/dL (H)).    Allergies  Allergen Reactions  . Latex Itching  . Ace Inhibitors Cough  . Adhesive [Tape] Itching and Other (See Comments)    Please use paper tape  . Lisinopril Cough    Thank you for allowing pharmacy to be a part of this patient's care.   Hessie KnowsJustin M Aesha Agrawal, PharmD, BCPS Pager [phone] 11/07/2017 2:34 PM

## 2017-11-07 NOTE — Consult Note (Signed)
[facility]    Date of Admission:  11/04/2017   Total days of antibiotics: 2 ceftriaxone               Reason for Consult: immunosuppression, BLE cellulitis    Referring Provider: Alfredia Ferguson   Assessment: BLE cellulitis Renal transplant Immunosuppression CKD 3T (1.55 --> 2.90 --> 2.14)   Plan: 1. Keep legs elevated 2. Check BCx 3. Change zosyn to cefepime (will give her pseudomonas coverage, not sure she needs anaerobe coverage without trauma to leg).  4. Add zyvox 5. Await prograf level 6. Improve diabetic control.  7. Appreciate outstanding renal eval and improvement in her CrCl and metabolic acidosis.   Comment- Very unusual to have bilateral infections. She could have skin breakdown from edema that lead to this. Typically these syndromes do well with anbx and keeping legs elevated, managing fluid.   Thank you so much for this interesting consult,  Principal Problem:   Localized swelling of lower extremity Active Problems:   Immunosuppression ([place])   Hypothyroidism   H/O kidney transplant   Essential hypertension   Uncontrolled type 1 diabetes mellitus with foot ulcer ([facility])   Anemia of chronic disease   . amLODipine  10 mg Oral QHS  . carvedilol  50 mg Oral BID  . Chlorhexidine Gluconate Cloth  6 each Topical Q0600  . heparin injection (subcutaneous)  5,000 Units Subcutaneous Q8H  . hydrALAZINE  25 mg Oral Q12H  . insulin aspart  0-15 Units Subcutaneous TID WC  . insulin aspart  0-5 Units Subcutaneous QHS  . insulin aspart protamine- aspart  20 Units Subcutaneous BID AC  . levothyroxine  137 mcg Oral QAC breakfast  . mouth rinse  15 mL Mouth Rinse BID  . multivitamin with minerals  1 tablet Oral Daily  . mupirocin ointment  1 application Nasal BID  . mupirocin ointment   Topical BID  . mycophenolate  500 mg Oral BID  . potassium chloride  20 mEq Oral BID  . predniSONE  5 mg Oral Daily  . protein supplement shake  11 oz  Oral BID BM  . tacrolimus  0.5 mg Oral QAC breakfast  . tacrolimus  1 mg Oral q1800    HPI: [name] is a 52 y.o. female with hx of DM2, renal txp (cadaveric, 2011, on prograf/cellcept/prednisone) adm on 2-28 with worsening of her chronic LE edema. She noted 3-4 days of worsening redness, warmth, R > L. She also developed increased pain to the point that she had difficulty ambulating. Her WBC was 7, she was afebrile, Glc 363, lactic acid 1.53. She underwent u/s which did not show DVT.  She was initially started on ceftriaxone then changed to Zosyn today due to lack of response.  Her FSG have also been elevated during this time.   She denies trauma to her legs, cat or dog interactions.   Review of Systems: Review of Systems  Constitutional: Negative for chills and fever.  HENT: Positive for sore throat.   Respiratory: Negative for cough and shortness of breath.   Gastrointestinal: Negative for constipation and diarrhea.  Genitourinary: Negative for dysuria.  Skin: Positive for rash.  Please see HPI. All other systems reviewed and negative.   Past Medical History:  Diagnosis Date  . Arthritis    "elbows, knees, legs, back" (09/24/2015)  . CKD (chronic kidney disease)   . Daily headache   . Depression    "  years ago"  . End stage renal disease (HCC)    right arm AV graft, resolved post transplant  . Hypertension   . Hypothyroid   . 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 Kidney disease 2011  . Pneumonia ~ 2007?  [name] Type II diabetes mellitus ([facility])    Insulin dependant    Social History   Tobacco Use  . Smoking status: Former Smoker    Packs/day: 1.00    Years: 11.00    Pack years: 11.00    Types: Cigarettes    Last attempt to quit: 09/21/1998    Years since quitting: 19.1  . Smokeless tobacco: Never Used  Substance Use Topics  . Alcohol use: No  . Drug use: No    Family History  Problem Relation Age of Onset  . Emphysema Mother   .  Throat cancer Mother   . COPD Mother   . Cancer Mother   . Emphysema Father   . COPD Father   . Stroke Father   . ADD / ADHD Son    The past medical history, family history and social history were reviewed/updated in EPIC   Medications:  Scheduled: . amLODipine  10 mg Oral QHS  . carvedilol  50 mg Oral BID  . Chlorhexidine Gluconate Cloth  6 each Topical Q0600  . heparin injection (subcutaneous)  5,000 Units Subcutaneous Q8H  . hydrALAZINE  25 mg Oral Q12H  . insulin aspart  0-15 Units Subcutaneous TID WC  . insulin aspart  0-5 Units Subcutaneous QHS  . insulin aspart protamine- aspart  20 Units Subcutaneous BID AC  . levothyroxine  137 mcg Oral QAC breakfast  . mouth rinse  15 mL Mouth Rinse BID  . multivitamin with minerals  1 tablet Oral Daily  . mupirocin ointment  1 application Nasal BID  . mupirocin ointment   Topical BID  . mycophenolate  500 mg Oral BID  . potassium chloride  20 mEq Oral BID  . predniSONE  5 mg Oral Daily  . protein supplement shake  11 oz Oral BID BM  . tacrolimus  0.5 mg Oral QAC breakfast  . tacrolimus  1 mg Oral q1800    Abtx:  Anti-infectives (From admission, onward)   Start     Dose/Rate Route Frequency Ordered Stop   11/07/17 1300  piperacillin-tazobactam (ZOSYN) IVPB 3.375 g     3.375 g 100 mL/hr over 30 Minutes Intravenous  Once 11/07/17 1227     11/05/17 2200  cefTRIAXone (ROCEPHIN) 1 g in sodium chloride 0.9 % 100 mL IVPB  Status:  Discontinued     1 g 200 mL/hr over 30 Minutes Intravenous Every 24 hours 11/05/17 0801 11/07/17 1214   11/04/17 2245  cefTRIAXone (ROCEPHIN) 1 g in sodium chloride 0.9 % 100 mL IVPB     1 g 200 mL/hr over 30 Minutes Intravenous  Once 11/04/17 2242 11/05/17 0117        OBJECTIVE: Blood pressure 137/60, pulse 77, temperature 98 F (36.7 C), temperature source Oral, resp. rate 20, height '5\' 7"'  (1.702 m), weight 84.3 kg (185 lb 13.6 oz), last menstrual period 11/19/2011, SpO2 98 %.  Physical Exam    Constitutional: She is oriented to person, place, and time and well-developed, well-nourished, and in no distress.  HENT:  Mouth/Throat: No oropharyngeal exudate.  Eyes: EOM are normal. Pupils are equal, round, and reactive to light.  Neck: Neck supple.  Cardiovascular: Normal rate, regular rhythm and normal  heart sounds.  Pulmonary/Chest: Effort normal and breath sounds normal.  Abdominal: Soft. Bowel sounds are normal. There is no tenderness. There is no rebound.  abd surgical scar, well healed.   Musculoskeletal: She exhibits edema.       Legs: Lymphadenopathy:    She has no cervical adenopathy.  Neurological: She is alert and oriented to person, place, and time.  Psychiatric: Mood normal.    Lab Results Results for orders placed or performed during the hospital encounter of 11/04/17 (from the past 48 hour(s))  Glucose, capillary     Status: Abnormal   Collection Time: 11/05/17  5:03 PM  Result Value Ref Range   Glucose-Capillary 124 (H) 65 - 99 mg/dL  Pregnancy, urine     Status: None   Collection Time: 11/05/17  5:20 PM  Result Value Ref Range   Preg Test, Ur NEGATIVE NEGATIVE    Comment:        THE SENSITIVITY OF THIS METHODOLOGY IS >20 mIU/mL. Performed at [facility], [place] [address] [place], [place] [address]   Creatinine, urine, random     Status: None   Collection Time: 11/05/17  5:20 PM  Result Value Ref Range   Creatinine, Urine 103.13 mg/dL    Comment: Performed at [facility] [address] [place], [place] [address]  Sodium, urine, random     Status: None   Collection Time: 11/05/17  5:20 PM  Result Value Ref Range   Sodium, Ur <10 mmol/L    Comment: REPEATED TO VERIFY Performed at [facility], [place] [address], [place], [place] [address]   Glucose, capillary     Status: Abnormal   Collection Time: 11/05/17  9:01 PM  Result Value Ref Range   Glucose-Capillary 125 (H) 65 - 99 mg/dL   Comment 1 Notify RN   Iron and  TIBC     Status: Abnormal   Collection Time: 11/06/17  5:15 AM  Result Value Ref Range   Iron 5 (L) 28 - 170 ug/dL   TIBC 122 (L) 250 - 450 ug/dL   Saturation Ratios 4 (L) 10.4 - 31.8 %   UIBC 117 ug/dL    Comment: Performed at [facility], [place] [address], [place], [place] [address]  Ferritin     Status: Abnormal   Collection Time: 11/06/17  5:15 AM  Result Value Ref Range   Ferritin 333 (H) 11 - 307 ng/mL    Comment: Performed at [facility], [place] [address] [place], [place] [address]  CBC with Differential/Platelet     Status: Abnormal   Collection Time: 11/06/17  5:15 AM  Result Value Ref Range   WBC 7.7 4.0 - 10.5 K/uL   RBC 3.18 (L) 3.87 - 5.11 MIL/uL   Hemoglobin 9.1 (L) 12.0 - 15.0 g/dL   HCT 29.2 (L) 36.0 - 46.0 %   MCV 91.8 78.0 - 100.0 fL   MCH 28.6 26.0 - 34.0 pg   MCHC 31.2 30.0 - 36.0 g/dL   RDW 14.9 11.5 - 15.5 %   Platelets 128 (L) 150 - 400 K/uL   Neutrophils Relative % 85 %   Neutro Abs 6.6 1.7 - 7.7 K/uL   Lymphocytes Relative 7 %   Lymphs Abs 0.5 (L) 0.7 - 4.0 K/uL   Monocytes Relative 8 %   Monocytes Absolute 0.6 0.1 - 1.0 K/uL   Eosinophils Relative 0 %   Eosinophils Absolute 0.0 0.0 - 0.7 K/uL  Basophils Relative 0 %   Basophils Absolute 0.0 0.0 - 0.1 K/uL    Comment: Performed at [facility], [place] [address] [place], [place] [address]  Comprehensive metabolic panel     Status: Abnormal   Collection Time: 11/06/17  5:15 AM  Result Value Ref Range   Sodium 140 135 - 145 mmol/L   Potassium 3.9 3.5 - 5.1 mmol/L    Comment: DELTA CHECK NOTED   Chloride 106 101 - 111 mmol/L   CO2 24 22 - 32 mmol/L   Glucose, Bld 120 (H) 65 - 99 mg/dL   BUN 58 (H) 6 - 20 mg/dL   Creatinine, Ser 2.42 (H) 0.44 - 1.00 mg/dL   Calcium 8.6 (L) 8.9 - 10.3 mg/dL   Total Protein 5.0 (L) 6.5 - 8.1 g/dL   Albumin 1.7 (L) 3.5 - 5.0 g/dL   AST 12 (L) 15 - 41 U/L   ALT 9 (L) 14 - 54 U/L   Alkaline Phosphatase 79 38 - 126 U/L   Total Bilirubin  0.5 0.3 - 1.2 mg/dL   GFR calc non Af Amer 22 (L) >60 mL/min   GFR calc Af Amer 26 (L) >60 mL/min    Comment: (NOTE) The eGFR has been calculated using the CKD EPI equation. This calculation has not been validated in all clinical situations. eGFR's persistently <60 mL/min signify possible Chronic Kidney Disease.    Anion gap 10 5 - 15    Comment: Performed at [facility], [place] [address] [place], [place] [address]  Magnesium     Status: None   Collection Time: 11/06/17  5:15 AM  Result Value Ref Range   Magnesium 1.9 1.7 - 2.4 mg/dL    Comment: Performed at [facility], [place] [address], [place], [place] [address]  Phosphorus     Status: None   Collection Time: 11/06/17  5:15 AM  Result Value Ref Range   Phosphorus 3.8 2.5 - 4.6 mg/dL    Comment: Performed at [facility], [place] [address], [place], [place] [address]  Glucose, capillary     Status: Abnormal   Collection Time: 11/06/17  7:40 AM  Result Value Ref Range   Glucose-Capillary 105 (H) 65 - 99 mg/dL  Glucose, capillary     Status: Abnormal   Collection Time: 11/06/17 11:21 AM  Result Value Ref Range   Glucose-Capillary 60 (L) 65 - 99 mg/dL  Glucose, capillary     Status: None   Collection Time: 11/06/17 12:06 PM  Result Value Ref Range   Glucose-Capillary 95 65 - 99 mg/dL  Glucose, capillary     Status: Abnormal   Collection Time: 11/06/17  4:20 PM  Result Value Ref Range   Glucose-Capillary 172 (H) 65 - 99 mg/dL  Glucose, capillary     Status: Abnormal   Collection Time: 11/06/17  9:49 PM  Result Value Ref Range   Glucose-Capillary 128 (H) 65 - 99 mg/dL  Renal function panel     Status: Abnormal   Collection Time: 11/07/17  5:31 AM  Result Value Ref Range   Sodium 138 135 - 145 mmol/L   Potassium 3.4 (L) 3.5 - 5.1 mmol/L   Chloride 100 (L) 101 - 111 mmol/L   CO2 28 22 - 32 mmol/L   Glucose, Bld 152 (H) 65 - 99 mg/dL   BUN 52 (H) 6 - 20 mg/dL    Creatinine, Ser 2.12 (H) 0.44 - 1.00 mg/dL  Calcium 8.3 (L) 8.9 - 10.3 mg/dL   Phosphorus 2.6 2.5 - 4.6 mg/dL   Albumin 1.5 (L) 3.5 - 5.0 g/dL   GFR calc non Af Amer 26 (L) >60 mL/min   GFR calc Af Amer 30 (L) >60 mL/min    Comment: (NOTE) The eGFR has been calculated using the CKD EPI equation. This calculation has not been validated in all clinical situations. eGFR's persistently <60 mL/min signify possible Chronic Kidney Disease.    Anion gap 10 5 - 15    Comment: Performed at [facility], [place] [address], [place], [place] [address]  CBC with Differential/Platelet     Status: Abnormal   Collection Time: 11/07/17  5:31 AM  Result Value Ref Range   WBC 7.4 4.0 - 10.5 K/uL   RBC 3.16 (L) 3.87 - 5.11 MIL/uL   Hemoglobin 9.0 (L) 12.0 - 15.0 g/dL   HCT 28.8 (L) 36.0 - 46.0 %   MCV 91.1 78.0 - 100.0 fL   MCH 28.5 26.0 - 34.0 pg   MCHC 31.3 30.0 - 36.0 g/dL   RDW 14.6 11.5 - 15.5 %   Platelets 125 (L) 150 - 400 K/uL   Neutrophils Relative % 83 %   Neutro Abs 6.1 1.7 - 7.7 K/uL   Lymphocytes Relative 10 %   Lymphs Abs 0.7 0.7 - 4.0 K/uL   Monocytes Relative 7 %   Monocytes Absolute 0.5 0.1 - 1.0 K/uL   Eosinophils Relative 0 %   Eosinophils Absolute 0.0 0.0 - 0.7 K/uL   Basophils Relative 0 %   Basophils Absolute 0.0 0.0 - 0.1 K/uL    Comment: Performed at [facility], [place] [address] [place], [place] [address]  Comprehensive metabolic panel     Status: Abnormal   Collection Time: 11/07/17  5:31 AM  Result Value Ref Range   Sodium 137 135 - 145 mmol/L   Potassium 3.4 (L) 3.5 - 5.1 mmol/L   Chloride 100 (L) 101 - 111 mmol/L   CO2 27 22 - 32 mmol/L   Glucose, Bld 151 (H) 65 - 99 mg/dL   BUN 51 (H) 6 - 20 mg/dL   Creatinine, Ser 2.14 (H) 0.44 - 1.00 mg/dL   Calcium 8.3 (L) 8.9 - 10.3 mg/dL   Total Protein 5.0 (L) 6.5 - 8.1 g/dL   Albumin 1.6 (L) 3.5 - 5.0 g/dL   AST 13 (L) 15 - 41 U/L   ALT 8 (L) 14 - 54 U/L   Alkaline Phosphatase 83  38 - 126 U/L   Total Bilirubin 0.5 0.3 - 1.2 mg/dL   GFR calc non Af Amer 26 (L) >60 mL/min   GFR calc Af Amer 30 (L) >60 mL/min    Comment: (NOTE) The eGFR has been calculated using the CKD EPI equation. This calculation has not been validated in all clinical situations. eGFR's persistently <60 mL/min signify possible Chronic Kidney Disease.    Anion gap 10 5 - 15    Comment: Performed at [facility], [place] [address] [place], [place] [address]  Magnesium     Status: None   Collection Time: 11/07/17  5:31 AM  Result Value Ref Range   Magnesium 1.8 1.7 - 2.4 mg/dL    Comment: Performed at [facility], [place] [address] [place], [place] [address]  Phosphorus     Status: None   Collection Time: 11/07/17  5:31 AM  Result Value Ref Range   Phosphorus 2.5  2.5 - 4.6 mg/dL    Comment: Performed at [facility], [place] [address], [place], [place] [address]  Glucose, capillary     Status: Abnormal   Collection Time: 11/07/17  7:57 AM  Result Value Ref Range   Glucose-Capillary 157 (H) 65 - 99 mg/dL  Glucose, capillary     Status: Abnormal   Collection Time: 11/07/17 11:50 AM  Result Value Ref Range   Glucose-Capillary 210 (H) 65 - 99 mg/dL      Component Value Date/Time   SDES BLOOD RIGHT HAND 08/31/2017 1621   SPECREQUEST  08/31/2017 1621    IN BOTH AEROBIC AND ANAEROBIC BOTTLES Blood Culture adequate volume   CULT NO GROWTH 5 DAYS 08/31/2017 1621   REPTSTATUS 09/05/2017 FINAL 08/31/2017 1621   Ct Abdomen Pelvis Wo Contrast  Result Date: 11/05/2017 CLINICAL DATA:  BILATERAL flank and back pain question kidney stone, history of renal transplant, diabetes mellitus, former smoker EXAM: CT ABDOMEN AND PELVIS WITHOUT CONTRAST TECHNIQUE: Multidetector CT imaging of the abdomen and pelvis was performed following the standard protocol without IV contrast. Sagittal and coronal MPR images reconstructed from axial data set. COMPARISON:   04/10/2016 FINDINGS: Lower chest: Subsegmental atelectasis at LEFT base. Hepatobiliary: Focal fatty infiltration of the liver. Otherwise normal appearance of liver and gallbladder. Pancreas: Atrophic, grossly unremarkable Spleen: Normal appearance Adrenals/Urinary Tract: Thickening of adrenal glands without discrete mass. Markedly atrophic native kidneys. Transplant kidney RIGHT iliac fossa without gross mass or hydronephrosis. Transplant ureter and bladder unremarkable. Stomach/Bowel: Stomach and bowel loops unremarkable appearance for technique. Vascular/Lymphatic: Extensive atherosclerotic calcifications of both abdominal aorta and multiple branch vessels. Aorta normal caliber. No adenopathy. Reproductive: Normal appearing uterus and adnexa Other: No free air or free fluid. Diffuse soft tissue edema both of abdominal wall and intra-abdominal planes question related to fluid overload or hypoproteinemia. Tiny umbilical hernia containing fat. Musculoskeletal: Diffusely demineralized. Degenerative disc disease changes lumbar spine. Cannulated screws at proximal RIGHT femur. IMPRESSION: No acute intra-abdominal or intrapelvic abnormalities. Atrophic native kidneys with unremarkable appearing transplant kidney in RIGHT iliac fossa. Scattered soft tissue edema question related to fluid overload or hypoproteinemia. Electronically Signed   By: Lavonia Dana M.D.   On: 11/05/2017 16:28   US Renal  Result Date: 11/05/2017 CLINICAL DATA:  Acute kidney injury, transplanted kidney EXAM: ULTRASOUND OF RENAL TRANSPLANT TECHNIQUE: Ultrasound examination of the renal transplant was performed with gray-scale and color Doppler evaluation. COMPARISON:  CT 11/05/2017, ultrasound 06/05/2015 FINDINGS: Transplant kidney location:  Right lower quadrant Transplant kidney description: Length: 12 cm. Within normal limits in parenchymal echogenicity. No evidence of mass or hydronephrosis. No peri-transplant fluid collection seen. Color flow  in the main renal artery at the hilum:  Visualized Color flow in the main renal vein at the hilum:  Visualized Bladder: Foley catheter in the bladder. Other findings: Atrophic echogenic native right kidney. Poorly visible native left kidney. IMPRESSION: Negative ultrasound appearance of the right lower quadrant transplant kidney. Electronically Signed   By: Donavan Foil M.D.   On: 11/05/2017 21:35   Nm Pulmonary Perf And Vent  Result Date: 11/05/2017 CLINICAL DATA:  Elevated D-dimer, DVT in BILATERAL lower extremities, smoker EXAM: NUCLEAR MEDICINE VENTILATION - PERFUSION LUNG SCAN TECHNIQUE: Ventilation images were obtained in multiple projections using inhaled aerosol Tc-57mDTPA. Perfusion images were obtained in multiple projections after intravenous injection of Tc-927mAA. RADIOPHARMACEUTICALS:  32.4 mCi of Tc-9969mPA aerosol inhalation and 4.36 mCi Tc99m2m IV COMPARISON:  None. FINDINGS: Ventilation: Central airway deposition of aerosol.  Slightly diminished diffuse peripheral distribution of aerosol. No definite focal ventilation defects. Perfusion: Normal IMPRESSION: Normal perfusion lung scan. Electronically Signed   By: Lavonia Dana M.D.   On: 11/05/2017 15:53   Dg Chest Port 1 View  Result Date: 11/06/2017 CLINICAL DATA:  52 year old female admitted with progressive lower extremity edema, pain and erythema. Acute on chronic renal failure status post renal transplant. EXAM: PORTABLE CHEST 1 VIEW COMPARISON:  CT Abdomen and Pelvis 11/05/2017. 09/01/2017 and earlier. FINDINGS: Portable AP semi upright view at 0436 hours. Mild elevation of the right hemidiaphragm. Allowing for portable technique the lungs are clear. Mediastinal contours remain normal. Visualized tracheal air column is within normal limits. No acute osseous abnormality identified. IMPRESSION: No acute cardiopulmonary abnormality. Electronically Signed   By: Genevie Ann M.D.   On: 11/06/2017 06:41   Ct Extrem Lower Wo Cm Bil  Result  Date: 11/07/2017 CLINICAL DATA:  Worsening bilateral lower extremity pain swelling and redness over the last several days. She reports having chronic lower extremity edema that fluctuates, but noticed over the last 3-4 days symptoms progressively worsened. EXAM: CT OF THE LOWER BILATERAL EXTREMITY WITHOUT CONTRAST TECHNIQUE: Multidetector CT imaging of the lower bilateral extremity was performed according to the standard protocol. COMPARISON:  None. FINDINGS: Bones/Joint/Cartilage Generalized osteopenia. Nondisplaced fracture of the medial margin of the right femoral head neck transfixed with 3 cannulated screws. Mild osteoarthritis of the right hip. Normal alignment. No joint effusion. Knee joint spaces are maintained bilaterally. Ankle mortise is normal bilaterally. Subtalar joints are normal bilaterally. Mild osteoarthritis of bilateral sacroiliac joints. No aggressive osseous lesion. Severe degenerative disc disease with disc height loss at L4-5 and L5-S1 with severe bilateral facet arthropathy and foraminal stenosis. Ligaments Ligaments are suboptimally evaluated by CT. Muscles and Tendons Mild generalized symmetric muscle atrophy bilaterally. No intramuscular fluid collection or hematoma. No intramuscular emphysema. Soft tissue Mild soft tissue edema throughout bilateral lower extremities and pelvis most consistent with anasarca. Peripheral vascular atherosclerotic disease. 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 Foley catheter within the bladder. No soft tissue mass. IMPRESSION: 1. Mild soft tissue edema throughout bilateral lower extremities and moderate soft tissue edema in the subcutaneous fat of the pelvis and lower abdomen as can be seen with anasarca. 2. Extensive peripheral vascular atherosclerotic disease. 3. Nondisplaced fracture of the medial margin of the right femoral head neck transfixed with 3 cannulated screws. Electronically Signed   By: Kathreen Devoid   On: 11/07/2017 10:06   Recent Results  (from the past 240 hour(s))  MRSA PCR Screening     Status: Abnormal   Collection Time: 11/05/17  3:19 AM  Result Value Ref Range Status   MRSA by PCR POSITIVE (A) NEGATIVE Final    Comment:        The GeneXpert MRSA Assay (FDA approved for NASAL specimens only), is one component of a comprehensive MRSA colonization surveillance program. It is not intended to diagnose MRSA infection nor to guide or monitor treatment for MRSA infections. RESULT CALLED TO, READ BACK BY AND VERIFIED WITH: HOLT,B @ 1191 ON [id number] BY POTEAT,S Performed at [facility] [address], [place], [place] [address]     Microbiology: Recent Results (from the past 240 hour(s))  MRSA PCR Screening     Status: Abnormal   Collection Time: 11/05/17  3:19 AM  Result Value Ref Range Status   MRSA by PCR POSITIVE (A) NEGATIVE Final    Comment:        The  GeneXpert MRSA Assay (FDA approved for NASAL specimens only), is one component of a comprehensive MRSA colonization surveillance program. It is not intended to diagnose MRSA infection nor to guide or monitor treatment for MRSA infections. RESULT CALLED TO, READ BACK BY AND VERIFIED WITH: HOLT,B @ 6063 ON [id number] BY POTEAT,S Performed at [facility] [address], [place], [place] [address]     Radiographs and labs were personally reviewed by me.   Bobby Rumpf, MD [facility] [place] [facility] [phone] 11/07/2017, 1:49 PM

## 2017-11-07 NOTE — Progress Notes (Signed)
PROGRESS NOTE    [name]  NAT:[id number] DOB: [birth year] DOA: 11/04/2017 PCP: Hayden Rasmussen, MD   Brief Narrative:  HPI per Dr. Tamala Julian: [name] is a 52 y.o. female with medical history significant of HTN, hypothyroidism, s/p renal transplant on chronic immunosuppressive therapy, DM type I, depression, history of DVT not currently on anticoagulation; who presented with complaints of worsening bilateral lower extremity pain swelling and redness over the last several days.  She reports having chronic lower extremity edema that fluctuates, but noticed over the last 3-4 days symptoms progressively worsened.  Notes developing redness and increased warmth of both lower legs, but worse on the right.  Tried taking Tylenol without relief of pain.  Pain was so severe that she was unable to ambulate and felt that she may fall if she tried to stand up.  Denies having any recent trauma or scrapes to the leg, chest pain, fever, black/bloody stools, or dysuria.  She also complained of persistently elevated blood sugars into the 300s.  ED Course: Upon admission to the emergency department patient was noted to have stable vital signs.  Labs revealed WBC 7, hemoglobin 9.6, platelets 129, BUN 50, creatinine 2.63, glucose 363,  and lactic acid 1.53.  Patient was given 500 mL of normal saline IV fluid and Rocephin.  TRH called to admit.  *Patient was a little confused today. States Legs still hurt tremendously. ID consulted for further evaluation and recommendations. Has erythematous Right Hip so dicussed case with Ortho PA who recommended MRI of Hip.   Assessment & Plan:   Principal Problem:   Localized swelling of lower extremity Active Problems:   Immunosuppression (HCC)   Hypothyroidism   H/O kidney transplant   Essential hypertension   Uncontrolled type 1 diabetes mellitus with foot ulcer (HCC)   Anemia of chronic disease  Bilateral Lower extremity edema (cellulitis vs right lower  extremity DVT):  -Acute. Given patient previous history of DVT high suspicion for recurrence. -Placed on empiric antibiotics of Rocephin for the possibility also of cellulitis and will continue for now. -Admitted a telemetry bed -Changed Rocephin to Zosyn and ID changed it IV Cefepime and adding Zyvox -Check D-Dimer and was 4.10, ESR was 100, CRP was 32.3 -Checked Doppler ultrasound of lower extremities and did not show acute DVT -Heparin gtt started and dosed per pharmacy but discontinued as VQ Scan was Normal and LE DVT Negative -VQ Scan done given Elevated D-Dimer of 4 but was Normal -CT Abd/Pelvis done and showed no mass or intraabdominal or intrapevlic abnormalities -Unfortunately cannot get CT Abd/Pelvis with Contrast due to Renal Fxn to evaluate for ? Iliac Blockages -CT of Legs w/o Contrast to evaluate showed Mild soft tissue edema throughout bilateral lower extremities and moderate soft tissue edema in the subcutaneous fat of the pelvis and lower abdomen as can be seen with anasarca.  Extensive peripheral vascular atherosclerotic disease.  Nondisplaced fracture of the medial margin of the right femoral head neck transfixed with 3 cannulated screws. -ID Consulted for further evaluation and recommendations; Recommending Checking Blood Cx and Keeping Legs elevated   -Discussed Case with Hematology who recommended obtaining DIC Panel as she may have some DIC  Right Hip Erythema and Warmth -Concerned for Hardware infection -Discussed with Ortho PA who recommended MRI of Hip (without Contrast given 1 kidney) -Follow up on MRI   Acute renal failure on chronic kidney disease Stage 3, s/p renal transplant on chronic immunosuppressive therapy, improving  -Patient baseline creatinine previously  noted to be around 1.5, but presents with a creatinine of 2.63 and BUN 50 on admission  Elevated BUN to creatinine ratio suggest prerenal cause of symptoms.  -BUN/Cr now improved to 2.14 -Nephrology Dr.  Georgiann Hahn for further evaluation and recommendations and appreciate additional recc's -Check Prograf level (pending) -Continue CellCept, Prograf, Prednisone -IVF now stopped by Nephro -Renal U/S Negative ultrasound appearance of the right lower quadrant transplant kidney. -CT Abd/Pelvis showed Atrophic native kidneys with unremarkable appearing transplant kidney in RIGHT iliac fossa. -Strict I's/O's, Daily Weights -Check FENa and Urine Lytes  -U/A showed showed Amber Color Urine, Small Hb, >300 Protein; WBC was 6-30 -Placed Foley Catheter for accurate I's/O's -Repeat CMP in AM and defer to Nephro for further recc's  Diabetes mellitus type 1 with hyperglycemia, uncontrolled -Patient's last hemoglobin A1c noted to be 9.9 on 04/2017.  Initial glucose 363 without elevated anion gap. -C/w Hypoglycemic protocol -Decreased home insulin regimen to 15 units BID -CBGs q. before meals and at bedtime with moderate SSI -Diabetic Education Coordinator Consulted  -CBG's ranging from 50-185  Anemia of Chronic Disease:  -Hemoglobin appears to be near patient's baseline of 8-9 g/dL -Hb/Hct went from 9.6/31.6 -> 8.4/27.4 -> 9.1/29.2 -> 9.0/28.8 -Iron Studies being checked by Nephrology and showed Iron Level of 5, UIBC of 117, TIBC of 122, Saturation of 4, Ferritin 333 -Continue to Monitor for S/Sx of Bleeding as patient was just empirically heparinized  -Repeat CBC in AM  Essential Hypertension -Continue Coreg, hydralazine, and amlodipine as tolerated -Avoid Hypotension   Hypothyroidism -Previously noted to be not well controlled back in 2018. -Add on TSH was elevated at 8.857; Free T4 was 1.17 -Continue Synthroid at current dose -Repeat TSH and Free T4 in 5-6 weeks after acute illness   History of DVT -Patient with previous history of DVT but appears to have been provoked not on anticoagulation at this time. 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 Posey Pronto   Procedures: V/Q Scan; CT of LE Extremities, MRI of Hip   Antimicrobials:  Anti-infectives (From admission, onward)   Start     Dose/Rate Route Frequency Ordered Stop   11/07/17 1600  ceFEPIme (MAXIPIME) 2 g in sodium chloride 0.9 % 100 mL IVPB     2 g 200 mL/hr over 30 Minutes Intravenous Every 24 hours 11/07/17 1435     11/07/17 1300  piperacillin-tazobactam (ZOSYN) IVPB 3.375 g  Status:  Discontinued     3.375 g 100 mL/hr over 30 Minutes Intravenous  Once 11/07/17 1227 11/07/17 1427   11/05/17 2200  cefTRIAXone (ROCEPHIN) 1 g in sodium chloride 0.9 % 100 mL IVPB  Status:  Discontinued     1 g 200 mL/hr over 30 Minutes Intravenous Every 24 hours 11/05/17 0801 11/07/17 1214   11/04/17 2245  cefTRIAXone (ROCEPHIN) 1 g in sodium chloride 0.9 %  100 mL IVPB     1 g 200 mL/hr over 30 Minutes Intravenous  Once 11/04/17 2242 11/05/17 0117     Subjective: Seen and examined at bedside and was awake and alert and oriented but at times had some confusion. States legs were still hurting and looked more erythematous. No CP or SOB.   Objective: Vitals:   11/06/17 2157 11/07/17 0433 11/07/17 1300 11/07/17 2108  BP: 133/67 137/60 132/60 140/60  Pulse: 80 77 81 78  Resp: 20 20 18 18  Temp: 98.6 F (37 C) 98 F (36.7 C) 98.2 F (36.8 C) 98.1 F (36.7  C)  TempSrc: Oral Oral Oral Oral  SpO2: 92% 98% 94% 93%  Weight:      Height:        Intake/Output Summary (Last 24 hours) at 11/07/2017 2235 Last data filed at 11/07/2017 1938 Gross per 24 hour  Intake 460 ml  Output 1420 ml  Net -960 ml   Filed Weights   11/05/17 0235 11/06/17 0504  Weight: 82.4 kg (181 lb 9.6 oz) 84.3 kg (185 lb 13.6 oz)   Examination: Physical Exam:  Constitutional: WN/WD ill appearing Caucasian female in NAD but complaining of Leg pain.  Eyes: Sclerae anicteric. Lids normal ENMT: External Ears and nose appear normal Neck: Supple with no JVD Respiratory: Diminished to auscultation bilaterally; No appreciable wheezing/rales/rhonchi Cardiovascular: RRR; No m/r/g. 2+ LE Edema Abdomen: Soft, NT, Distended due to body habitus.  GU: Deferred Musculoskeletal: No contractures; No cyanosis Skin: Had some Mositure iin folds of pannus. LE erythematous and warm bilaterally worse on Right. Had splotchy skin rash medially bilateral LE; Has multiple skin tattoos Neurologic: CN 2-12 grossly intact.  Psychiatric: Normal mood and affect. Had periods of confusion but was awake and alert and oriented x3 on my examination  Data Reviewed: I have personally reviewed following labs and imaging studies  CBC: Recent Labs  Lab 11/04/17 1356 11/05/17 0257 11/06/17 0515 11/07/17 0531 11/07/17 1603  WBC 7.0 8.7 7.7 7.4  --   NEUTROABS 6.2  --  6.6 6.1  --   HGB 9.6* 8.4* 9.1* 9.0*  --   HCT 31.6* 27.4* 29.2* 28.8*  --   MCV 92.1 91.9 91.8 91.1  --   PLT 129* 136* 128* 125* 159   Basic Metabolic Panel: Recent Labs  Lab 11/04/17 1356 11/05/17 0257 11/06/17 0515 11/07/17 0531  NA 135 137 140 137  138  K 4.4 4.8 3.9 3.4*  3.4*  CL 105 107 106 100*  100*  CO2 19* 17* 24 27  28  GLUCOSE 363* 368* 120* 151*  152*  BUN 50* 63* 58* 51*  52*  CREATININE 2.63* 2.90* 2.42* 2.14*  2.12*  CALCIUM 8.3* 8.4* 8.6* 8.3*  8.3*  MG  --   --  1.9 1.8  PHOS  --   --  3.8 2.5   2.6   GFR: Estimated Creatinine Clearance: 35.4 mL/min (A) (by C-G formula based on SCr of 2.12 mg/dL (H)). Liver Function Tests: Recent Labs  Lab 11/04/17 1356 11/06/17 0515 11/07/17 0531  AST 20 12* 13*  ALT 11* 9* 8*  ALKPHOS 86 79 83  BILITOT 1.3* 0.5 0.5  PROT 6.1* 5.0* 5.0*  ALBUMIN 2.1* 1.7* 1.6*  1.5*   No results for input(s): LIPASE, AMYLASE in the last 168 hours. No results for input(s): AMMONIA in the last 168 hours. Coagulation Profile: Recent Labs  Lab 11/05/17 0257 11/07/17 1603  INR 1.07 0.96   Cardiac   Enzymes: No results for input(s): CKTOTAL, CKMB, CKMBINDEX, TROPONINI in the last 168 hours. BNP (last 3 results) No results for input(s): PROBNP in the last 8760 hours. HbA1C: No results for input(s): HGBA1C in the last 72 hours. CBG: Recent Labs  Lab 11/07/17 1150 11/07/17 1626 11/07/17 1700 11/07/17 1741 11/07/17 2101  GLUCAP 210* 50* 61* 113* 185*   Lipid Profile: Recent Labs    11/05/17 1224  CHOL 114  HDL 28*  LDLCALC 49  TRIG 187*  CHOLHDL 4.1   Thyroid Function Tests: Recent Labs    11/05/17 0613 11/05/17 1224  TSH 8.857*  --   FREET4  --  1.17*   Anemia Panel: Recent Labs    11/06/17 0515  FERRITIN 333*  TIBC 122*  IRON 5*   Sepsis Labs: Recent Labs  Lab 11/04/17 1414  LATICACIDVEN 1.53    Recent Results (from the past 240 hour(s))  MRSA PCR Screening     Status: Abnormal   Collection Time: 11/05/17  3:19 AM  Result Value Ref Range Status   MRSA by PCR POSITIVE (A) NEGATIVE Final    Comment:        The GeneXpert MRSA Assay (FDA approved for NASAL specimens only), is one component of a comprehensive MRSA colonization surveillance program. It is not intended to diagnose MRSA infection nor to guide or monitor treatment for MRSA infections. RESULT CALLED TO, READ BACK BY AND VERIFIED WITH: HOLT,B @ 1514 ON [id number] BY POTEAT,S Performed at [facility], [address] [place],  [place] [address]      Radiology Studies: Dg Chest Port 1 View  Result Date: 11/06/2017 CLINICAL DATA:  51-year-old female admitted with progressive lower extremity edema, pain and erythema. Acute on chronic renal failure status post renal transplant. EXAM: PORTABLE CHEST 1 VIEW COMPARISON:  CT Abdomen and Pelvis 11/05/2017. 09/01/2017 and earlier. FINDINGS: Portable AP semi upright view at 0436 hours. Mild elevation of the right hemidiaphragm. Allowing for portable technique the lungs are clear. Mediastinal contours remain normal. Visualized tracheal air column is within normal limits. No acute osseous abnormality identified. IMPRESSION: No acute cardiopulmonary abnormality. Electronically Signed   By: H  Hall M.D.   On: 11/06/2017 06:41   Ct Extrem Lower Wo Cm Bil  Result Date: 11/07/2017 CLINICAL DATA:  Worsening bilateral lower extremity pain swelling and redness over the last several days. She reports having chronic lower extremity edema that fluctuates, but noticed over the last 3-4 days symptoms progressively worsened. EXAM: CT OF THE LOWER BILATERAL EXTREMITY WITHOUT CONTRAST TECHNIQUE: Multidetector CT imaging of the lower bilateral extremity was performed according to the standard protocol. COMPARISON:  None. FINDINGS: Bones/Joint/Cartilage Generalized osteopenia. Nondisplaced fracture of the medial margin of the right femoral head neck transfixed with 3 cannulated screws. Mild osteoarthritis of the right hip. Normal alignment. No joint effusion. Knee joint spaces are maintained bilaterally. Ankle mortise is normal bilaterally. Subtalar joints are normal bilaterally. Mild osteoarthritis of bilateral sacroiliac joints. No aggressive osseous lesion. Severe degenerative disc disease with disc height loss at L4-5 and L5-S1 with severe bilateral facet arthropathy and foraminal stenosis. Ligaments Ligaments are suboptimally evaluated by CT. Muscles and Tendons Mild generalized symmetric muscle atrophy  bilaterally. No intramuscular fluid collection or hematoma. No intramuscular emphysema. Soft tissue Mild soft tissue edema throughout bilateral lower extremities and pelvis most consistent with anasarca. Peripheral vascular atherosclerotic disease. 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 Foley catheter within the bladder. No soft tissue mass. IMPRESSION: 1. Mild soft tissue edema   throughout bilateral lower extremities and moderate soft tissue edema in the subcutaneous fat of the pelvis and lower abdomen as can be seen with anasarca. 2. Extensive peripheral vascular atherosclerotic disease. 3. Nondisplaced fracture of the medial margin of the right femoral head neck transfixed with 3 cannulated screws. Electronically Signed   By: Hetal  Patel   On: 11/07/2017 10:06    Scheduled Meds: . amLODipine  10 mg Oral QHS  . carvedilol  50 mg Oral BID  . Chlorhexidine Gluconate Cloth  6 each Topical Q0600  . heparin injection (subcutaneous)  5,000 Units Subcutaneous Q8H  . hydrALAZINE  25 mg Oral Q12H  . insulin aspart  0-15 Units Subcutaneous TID WC  . insulin aspart  0-5 Units Subcutaneous QHS  . insulin aspart protamine- aspart  20 Units Subcutaneous BID AC  . levothyroxine  137 mcg Oral QAC breakfast  . mouth rinse  15 mL Mouth Rinse BID  . multivitamin with minerals  1 tablet Oral Daily  . mupirocin ointment  1 application Nasal BID  . mupirocin ointment   Topical BID  . mycophenolate  500 mg Oral BID  . predniSONE  5 mg Oral Daily  . protein supplement shake  11 oz Oral BID BM  . tacrolimus  0.5 mg Oral QAC breakfast  . tacrolimus  1 mg Oral q1800   Continuous Infusions: . ceFEPime (MAXIPIME) IV 2 g (11/07/17 1700)    LOS: 2 days   Omair Latif Sheikh, DO [facility] Pager [phone]  If 7PM-7AM, please contact night-coverage [email or web address] Password [email or web address] 11/07/2017, 10:35 PM  

## 2017-11-08 ENCOUNTER — Inpatient Hospital Stay (HOSPITAL_COMMUNITY): Payer: Medicare Other

## 2017-11-08 DIAGNOSIS — Z96641 Presence of right artificial hip joint: Secondary | ICD-10-CM

## 2017-11-08 LAB — CBC WITH DIFFERENTIAL/PLATELET
BASOS ABS: 0 10*3/uL (ref 0.0–0.1)
BASOS PCT: 0 %
EOS ABS: 0 10*3/uL (ref 0.0–0.7)
EOS PCT: 1 %
HEMATOCRIT: 28.6 % — AB (ref 36.0–46.0)
Hemoglobin: 8.8 g/dL — ABNORMAL LOW (ref 12.0–15.0)
Lymphocytes Relative: 13 %
Lymphs Abs: 0.8 10*3/uL (ref 0.7–4.0)
MCH: 28.7 pg (ref 26.0–34.0)
MCHC: 30.8 g/dL (ref 30.0–36.0)
MCV: 93.2 fL (ref 78.0–100.0)
MONO ABS: 0.5 10*3/uL (ref 0.1–1.0)
MONOS PCT: 9 %
Neutro Abs: 4.6 10*3/uL (ref 1.7–7.7)
Neutrophils Relative %: 77 %
PLATELETS: 153 10*3/uL (ref 150–400)
RBC: 3.07 MIL/uL — ABNORMAL LOW (ref 3.87–5.11)
RDW: 14.6 % (ref 11.5–15.5)
WBC: 6 10*3/uL (ref 4.0–10.5)

## 2017-11-08 LAB — RENAL FUNCTION PANEL
ALBUMIN: 1.6 g/dL — AB (ref 3.5–5.0)
Anion gap: 10 (ref 5–15)
BUN: 50 mg/dL — AB (ref 6–20)
CALCIUM: 8.6 mg/dL — AB (ref 8.9–10.3)
CO2: 27 mmol/L (ref 22–32)
CREATININE: 2.15 mg/dL — AB (ref 0.44–1.00)
Chloride: 101 mmol/L (ref 101–111)
GFR calc Af Amer: 30 mL/min — ABNORMAL LOW (ref >60)
GFR, EST NON AFRICAN AMERICAN: 26 mL/min — AB (ref >60)
Glucose, Bld: 271 mg/dL — ABNORMAL HIGH (ref 65–99)
PHOSPHORUS: 2.7 mg/dL (ref 2.5–4.6)
POTASSIUM: 4 mmol/L (ref 3.5–5.1)
SODIUM: 138 mmol/L (ref 135–145)

## 2017-11-08 LAB — COMPREHENSIVE METABOLIC PANEL
ALT: 9 U/L — ABNORMAL LOW (ref 14–54)
ANION GAP: 9 (ref 5–15)
AST: 12 U/L — AB (ref 15–41)
Albumin: 1.6 g/dL — ABNORMAL LOW (ref 3.5–5.0)
Alkaline Phosphatase: 97 U/L (ref 38–126)
BILIRUBIN TOTAL: 0.9 mg/dL (ref 0.3–1.2)
BUN: 52 mg/dL — AB (ref 6–20)
CHLORIDE: 103 mmol/L (ref 101–111)
CO2: 28 mmol/L (ref 22–32)
Calcium: 8.7 mg/dL — ABNORMAL LOW (ref 8.9–10.3)
Creatinine, Ser: 2.14 mg/dL — ABNORMAL HIGH (ref 0.44–1.00)
GFR calc Af Amer: 30 mL/min — ABNORMAL LOW (ref >60)
GFR calc non Af Amer: 26 mL/min — ABNORMAL LOW (ref >60)
Glucose, Bld: 276 mg/dL — ABNORMAL HIGH (ref 65–99)
POTASSIUM: 4 mmol/L (ref 3.5–5.1)
Sodium: 140 mmol/L (ref 135–145)
TOTAL PROTEIN: 5.2 g/dL — AB (ref 6.5–8.1)

## 2017-11-08 LAB — GLUCOSE, CAPILLARY
GLUCOSE-CAPILLARY: 199 mg/dL — AB (ref 65–99)
GLUCOSE-CAPILLARY: 116 mg/dL — AB (ref 65–99)
GLUCOSE-CAPILLARY: 116 mg/dL — AB (ref 65–99)
GLUCOSE-CAPILLARY: 98 mg/dL (ref 65–99)
Glucose-Capillary: 285 mg/dL — ABNORMAL HIGH (ref 65–99)

## 2017-11-08 LAB — MAGNESIUM: Magnesium: 1.9 mg/dL (ref 1.7–2.4)

## 2017-11-08 LAB — PHOSPHORUS: Phosphorus: 2.7 mg/dL (ref 2.5–4.6)

## 2017-11-08 MED ORDER — LINEZOLID 600 MG PO TABS
600.0000 mg | ORAL_TABLET | Freq: Two times a day (BID) | ORAL | Status: DC
  Administered 2017-11-08 – 2017-11-11 (×7): 600 mg via ORAL
  Filled 2017-11-08 (×7): qty 1

## 2017-11-08 MED ORDER — SODIUM CHLORIDE 0.45 % IV SOLN
INTRAVENOUS | Status: DC
  Administered 2017-11-08 – 2017-11-10 (×4): via INTRAVENOUS
  Administered 2017-11-10: 100 mL/h via INTRAVENOUS

## 2017-11-08 NOTE — Progress Notes (Signed)
[facility]  [facility] will follow pt with ID team to support OPAT if ordered/needed at DC to home.  If patient discharges after hours, please call [phone].   Sedalia Mutaamela S Chandler 11/08/2017, 8:16 AM

## 2017-11-08 NOTE — Progress Notes (Signed)
PROGRESS NOTE    [name]  BJS:[id number] DOB: 1966/01/22 DOA: 11/04/2017 PCP: Hayden Rasmussen, MD   Brief Narrative:  HPI per Dr. Tamala Julian: [name] is a 52 y.o. female with medical history significant of HTN, hypothyroidism, s/p renal transplant on chronic immunosuppressive therapy, DM type I, depression, history of DVT not currently on anticoagulation; who presented with complaints of worsening bilateral lower extremity pain swelling and redness over the last several days.  She reports having chronic lower extremity edema that fluctuates, but noticed over the last 3-4 days symptoms progressively worsened.  Notes developing redness and increased warmth of both lower legs, but worse on the right.  Tried taking Tylenol without relief of pain.  Pain was so severe that she was unable to ambulate and felt that she may fall if she tried to stand up.  Denies having any recent trauma or scrapes to the leg, chest pain, fever, black/bloody stools, or dysuria.  She also complained of persistently elevated blood sugars into the 300s.  ED Course: Upon admission to the emergency department patient was noted to have stable vital signs.  Labs revealed WBC 7, hemoglobin 9.6, platelets 129, BUN 50, creatinine 2.63, glucose 363,  and lactic acid 1.53.  Patient was given 500 mL of normal saline IV fluid and Rocephin.  TRH called to admit.  *Patient complaining of Right Hip Pain and Erythema so ordered Hip MRI. Still complaining of LE Pain.   Assessment & Plan:   Principal Problem:   Localized swelling of lower extremity Active Problems:   Immunosuppression (HCC)   Hypothyroidism   H/O kidney transplant   Essential hypertension   Uncontrolled type 1 diabetes mellitus with foot ulcer (HCC)   Anemia of chronic disease  Bilateral Lower extremity edema (Cellulitis vs right lower extremity DVT):  -Acute. Given patient previous history of DVT high suspicion for recurrence. -Placed on empiric  antibiotics of Rocephin for the possibility also of cellulitis and will continue for now. -Admitted a telemetry bed -C/w IV Cefepime and po Zyvox by ID -Check D-Dimer and was 4.10, ESR was 100, CRP was 32.3 -Checked Doppler ultrasound of lower extremities and did not show acute DVT -Heparin gtt started and dosed per pharmacy but discontinued as VQ Scan was Normal and LE DVT Negative -VQ Scan done given Elevated D-Dimer of 4 but was Normal -CT Abd/Pelvis done and showed no mass or intraabdominal or intrapevlic abnormalities -Unfortunately cannot get CT Abd/Pelvis with Contrast due to Renal Fxn to evaluate for ? Iliac Blockages -CT of Legs w/o Contrast to evaluate showed Mild soft tissue edema throughout bilateral lower extremities and moderate soft tissue edema in the subcutaneous fat of the pelvis and lower abdomen as can be seen with anasarca.  Extensive peripheral vascular atherosclerotic disease.  Nondisplaced fracture of the medial margin of the right femoral head neck transfixed with 3 cannulated screws. -ID Consulted for further evaluation and recommendations; Recommending Checking Blood Cx and Keeping Legs elevated   -Blood Cx x2 pending  -Discussed Case with Hematology who recommended obtaining DIC Panel as she may have some DIC  Right Hip Erythema and Warmth -Concerned for Hardware infection -Discussed with Ortho PA who recommended MRI of Hip (without Contrast given 1 kidney) -MRI showed Nonspecific generalized soft tissue edema involving the musculature and subcutaneous and deep fat, consistent with nonspecific anasarca. Ascites without focal fluid collection.  Previous right hip pinning without displaced fracture. Marrow edema in the left superior pubic ramus suspicious for a nondisplaced  fracture. The pelvis otherwise appears intact. -May need Joint Aspiration?; Will discuss with ID Further   Acute renal failure on chronic kidney disease Stage 3, s/p renal transplant on chronic  immunosuppressive therapy, improving  -Patient baseline creatinine previously noted to be around 1.5, but presents with a creatinine of 2.63 and BUN 50 on admission  Elevated BUN to creatinine ratio suggest prerenal cause of symptoms.  -BUN/Cr now improved to 2.15 -Nephrologyfor further evaluation and recommendations and appreciate additional recc's -Check Prograf level (pending) -Continue CellCept, Prograf, Prednisone -IVF now being restarted by Nephro Dr. Jonnie Finner with 1/2 NS at 100 mL/hr -Renal U/S Negative ultrasound appearance of the right lower quadrant transplant kidney. -CT Abd/Pelvis showed Atrophic native kidneys with unremarkable appearing transplant kidney in RIGHT iliac fossa. -Strict I's/O's, Daily Weights -Check FENa and Urine Lytes  -U/A showed showed Amber Color Urine, Small Hb, >300 Protein; WBC was 6-30 -Placed Foley Catheter for accurate I's/O's -Repeat CMP in AM and defer to Nephro for further recc's  Diabetes mellitus type 1 with hyperglycemia, uncontrolled -Patient's last hemoglobin A1c noted to be 9.9 on 04/2017.  Initial glucose 363 without elevated anion gap. -C/w Hypoglycemic protocol -Decreased home insulin regimen to 15 units BID -CBGs q. before meals and at bedtime with moderate SSI -Diabetic Education Coordinator Consulted  -CBG's ranging from 116-285  Anemia of Chronic Disease:  -Hemoglobin appears to be near patient's baseline of 8-9 g/dL -Hb/Hct went from 9.6/31.6 -> 8.4/27.4 -> 9.1/29.2 -> 9.0/28.8 -> 8.8/28.6 -Iron Studies being checked by Nephrology and showed Iron Level of 5, UIBC of 117, TIBC of 122, Saturation of 4, Ferritin 333 -Continue to Monitor for S/Sx of Bleeding as patient was just empirically heparinized  -Repeat CBC in AM  Essential Hypertension -Continue Coreg, hydralazine, and amlodipine as tolerated -Avoid Hypotension   Hypothyroidism -Previously noted to be not well controlled back in 2018. -Add on TSH was elevated at 8.857;  Free T4 was 1.17 -Continue Synthroid at current dose -Repeat TSH and Free T4 in 5-6 weeks after acute illness   History of DVT -Patient with previous history of DVT but appears to have been provoked not on anticoagulation at this time. 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 -Now improved and off of Bicarbonate Infusion    LE Rash -Has splotchy areas of redness that are itchy -Give Benadryl 12.5 mg IV q6h -Started Triamcinolone 01.% : Eucerin Cream 1:1 Topically  Hypokalemia -K+ was 3.4 and improved to 4.0 -Replete with 20 meQ po BID -Continue to Monitor an Replete as Necessary -Repeat Phos Level in AM  DVT prophylaxis: Was Anticoagulated with Heparin gtt but that was changed to sq Heparin 5,000 units sq Code Status: FULL CODE Family Communication: No family present at bedside Disposition Plan: Remain Inpatient for further workup and evaluation   Consultants:   Nephrology   Infectious Diseases    Procedures: V/Q Scan; CT of LE Extremities, MRI of Hip   Antimicrobials:  Anti-infectives (From admission, onward)   Start     Dose/Rate Route Frequency Ordered Stop   11/08/17 1330  linezolid (ZYVOX) tablet 600 mg     600 mg Oral Every 12 hours 11/08/17 1246     11/07/17 1600  ceFEPIme (MAXIPIME) 2 g in sodium chloride 0.9 % 100 mL IVPB     2 g 200 mL/hr over 30 Minutes Intravenous Every 24 hours 11/07/17 1435     11/07/17 1300  piperacillin-tazobactam (  ZOSYN) IVPB 3.375 g  Status:  Discontinued     3.375 g 100 mL/hr over 30 Minutes Intravenous  Once 11/07/17 1227 11/07/17 1427   11/05/17 2200  cefTRIAXone (ROCEPHIN) 1 g in sodium chloride 0.9 % 100 mL IVPB  Status:  Discontinued     1 g 200 mL/hr over 30 Minutes Intravenous Every 24 hours 11/05/17 0801 11/07/17 1214   11/04/17 2245  cefTRIAXone (ROCEPHIN) 1 g in sodium chloride 0.9 % 100 mL IVPB     1 g 200 mL/hr over 30 Minutes Intravenous  Once  11/04/17 2242 11/05/17 0117     Subjective: Seen and examined at bedside and was still complaining of LE Pain. No CP or SOB. No nausea or vomiting but Right Hip was hurting more today.   Objective: Vitals:   11/07/17 2108 11/08/17 0520 11/08/17 1308 11/08/17 2057  BP: 140/60 127/66 (!) 132/54 (!) 129/55  Pulse: 78 77 72 78  Resp: _0 Temp: 98.1 F (36.7 C) 97.8 F (36.6 C) 97.9 F (36.6 C) 98 F (36.7 C)  TempSrc: Oral Oral Oral Oral  SpO2: 93% 92% 96% 96%  Weight:      Height:        Intake/Output Summary (Last 24 hours) at 11/08/2017 2113 Last data filed at 11/08/2017 1851 Gross per 24 hour  Intake 580 ml  Output 775 ml  Net -195 ml   Filed Weights   11/05/17 0235 11/06/17 0504  Weight: 82.4 kg (181 lb 9.6 oz) 84.3 kg (185 lb 13.6 oz)   Examination: Physical Exam:  Constitutional: WN/WD chronically ill appearing Caucasian female who appears uncomfortable but is in NAD Eyes: Sclerae anicteric. Lids normal ENMT: External Ears and nose appear normal Neck: Supple with no JVD Respiratory: Diminished to auscultation bilaterally; No appreciable wheezing/rales/rhonchi Cardiovascular: RRR; No m/r/g. 2+ LE edema Abdomen: Soft, NT, Distended due to body habitus GU: Deferred. Foley in place Musculoskeletal: Missing some toes; No contractures; no cyanosis Skin: Had Right Hip Erythema and swelling as well as LE erythema and wound on Right. Had splotchy rash on inner thighs and medial legs Neurologic: CN 2-12 grossly intact. Psychiatric: Normal mood and affect. Awake and alert  Data Reviewed: I have personally reviewed following labs and imaging studies  CBC: Recent Labs  Lab 11/04/17 1356 11/05/17 0257 11/06/17 0515 11/07/17 0531 11/07/17 1603 11/08/17 0534  WBC 7.0 8.7 7.7 7.4  --  6.0  NEUTROABS 6.2  --  6.6 6.1  --  4.6  HGB 9.6* 8.4* 9.1* 9.0*  --  8.8*  HCT 31.6* 27.4* 29.2* 28.8*  --  28.6*  MCV 92.1 91.9 91.8 91.1  --  93.2  PLT 129* 136* 128* 125*  159 607   Basic Metabolic Panel: Recent Labs  Lab 11/04/17 1356 11/05/17 0257 11/06/17 0515 11/07/17 0531 11/08/17 0534  NA 135 137 140 137  138 138  140  K 4.4 4.8 3.9 3.4*  3.4* 4.0  4.0  CL 105 107 106 100*  100* 101  103  CO2 19* 17* _1 GLUCOSE 363* 368* 120* 151*  152* 271*  276*  BUN 50* 63* 58* 51*  52* 50*  52*  CREATININE 2.63* 2.90* 2.42* 2.14*  2.12* 2.15*  2.14*  CALCIUM 8.3* 8.4* 8.6* 8.3*  8.3* 8.6*  8.7*  MG  --   --  1.9 1.8 1.9  PHOS  --   --  3.8 2.5  2.6 2.7  2.7   GFR: Estimated Creatinine Clearance: 34.9 mL/min (A) (by C-G formula based on SCr of 2.15 mg/dL (H)). Liver Function Tests: Recent Labs  Lab 11/04/17 1356 11/06/17 0515 11/07/17 0531 11/08/17 0534  AST 20 12* 13* 12*  ALT 11* 9* 8* 9*  ALKPHOS 86 79 83 97  BILITOT 1.3* 0.5 0.5 0.9  PROT 6.1* 5.0* 5.0* 5.2*  ALBUMIN 2.1* 1.7* 1.6*  1.5* 1.6*  1.6*   No results for input(s): LIPASE, AMYLASE in the last 168 hours. No results for input(s): AMMONIA in the last 168 hours. Coagulation Profile: Recent Labs  Lab 11/05/17 0257 11/07/17 1603  INR 1.07 0.96   Cardiac Enzymes: No results for input(s): CKTOTAL, CKMB, CKMBINDEX, TROPONINI in the last 168 hours. BNP (last 3 results) No results for input(s): PROBNP in the last 8760 hours. HbA1C: No results for input(s): HGBA1C in the last 72 hours. CBG: Recent Labs  Lab 11/07/17 2101 11/08/17 0750 11/08/17 1125 11/08/17 1734 11/08/17 1846  GLUCAP 185* 285* 199* 116* 116*   Lipid Profile: No results for input(s): CHOL, HDL, LDLCALC, TRIG, CHOLHDL, LDLDIRECT in the last 72 hours. Thyroid Function Tests: No results for input(s): TSH, T4TOTAL, FREET4, T3FREE, THYROIDAB in the last 72 hours. Anemia Panel: Recent Labs    11/06/17 0515  FERRITIN 333*  TIBC 122*  IRON 5*   Sepsis Labs: Recent Labs  Lab 11/04/17 1414  LATICACIDVEN 1.53    Recent Results (from the past 240 hour(s))  MRSA PCR  Screening     Status: Abnormal   Collection Time: 11/05/17  3:19 AM  Result Value Ref Range Status   MRSA by PCR POSITIVE (A) NEGATIVE Final    Comment:        The GeneXpert MRSA Assay (FDA approved for NASAL specimens only), is one component of a comprehensive MRSA colonization surveillance program. It is not intended to diagnose MRSA infection nor to guide or monitor treatment for MRSA infections. RESULT CALLED TO, READ BACK BY AND VERIFIED WITH: HOLT,B @ 2202 ON [id number] BY POTEAT,S Performed at [facility] [address] [place], [place] [address]      Radiology Studies: Mr Hip Right Wo Contrast  Result Date: 11/08/2017 CLINICAL DATA:  Bilateral lower extremity pain, swelling and erythema for several days. Patient reports chronic lower extremity edema. History of chronic kidney disease, diabetes, renal transplant and hypertension. EXAM: MR OF THE RIGHT HIP WITHOUT CONTRAST TECHNIQUE: Multiplanar, multisequence MR imaging was performed. No intravenous contrast was administered. COMPARISON:  Lower extremity CT 11/07/2017.  Pelvic CT 11/05/2017. FINDINGS: Bones: Status post fixation of the right femoral neck with 3 cortical screws. The hardware is in stable position. No displaced fracture. There is ill-defined low T1 and high T2 signal in the left superior pubic ramus, best seen on coronal images 17 and 18, likely a nondisplaced fracture. The inferior pubic ramus appears intact. The sacrum and sacroiliac joints appear intact. There are mild degenerative changes of both hips with small bilateral hip joint effusions. No evidence of femoral head avascular necrosis. Articular cartilage and labrum Articular cartilage:  No focal chondral defect. Labrum:  No gross labral tear or paralabral cyst. Joint or bursal effusion Joint effusion:  Small bilateral hip joint effusions. Bursae: No focal bursal fluid collection. Muscles and tendons Muscles and tendons: Generalized edema  throughout the visualized pelvic and proximal thigh musculature. No focal intramuscular fluid collection or acute tendon findings identified. Other findings Miscellaneous: As on previous CT, generalized anasarca with diffuse edema  throughout the subcutaneous and pelvic fat. Small amount of pelvic ascites. Transplant kidney in the right iliac fossa appears unchanged. IMPRESSION: 1. Nonspecific generalized soft tissue edema involving the musculature and subcutaneous and deep fat, consistent with nonspecific anasarca. Ascites without focal fluid collection. 2. Previous right hip pinning without displaced fracture. Marrow edema in the left superior pubic ramus suspicious for a nondisplaced fracture. The pelvis otherwise appears intact. Electronically Signed   By: Richardean Sale M.D.   On: 11/08/2017 17:18   Ct Extrem Lower Wo Cm Bil  Result Date: 11/07/2017 CLINICAL DATA:  Worsening bilateral lower extremity pain swelling and redness over the last several days. She reports having chronic lower extremity edema that fluctuates, but noticed over the last 3-4 days symptoms progressively worsened. EXAM: CT OF THE LOWER BILATERAL EXTREMITY WITHOUT CONTRAST TECHNIQUE: Multidetector CT imaging of the lower bilateral extremity was performed according to the standard protocol. COMPARISON:  None. FINDINGS: Bones/Joint/Cartilage Generalized osteopenia. Nondisplaced fracture of the medial margin of the right femoral head neck transfixed with 3 cannulated screws. Mild osteoarthritis of the right hip. Normal alignment. No joint effusion. Knee joint spaces are maintained bilaterally. Ankle mortise is normal bilaterally. Subtalar joints are normal bilaterally. Mild osteoarthritis of bilateral sacroiliac joints. No aggressive osseous lesion. Severe degenerative disc disease with disc height loss at L4-5 and L5-S1 with severe bilateral facet arthropathy and foraminal stenosis. Ligaments Ligaments are suboptimally evaluated by CT.  Muscles and Tendons Mild generalized symmetric muscle atrophy bilaterally. No intramuscular fluid collection or hematoma. No intramuscular emphysema. Soft tissue Mild soft tissue edema throughout bilateral lower extremities and pelvis most consistent with anasarca. Peripheral vascular atherosclerotic disease. 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 Foley catheter within the bladder. No soft tissue mass. IMPRESSION: 1. Mild soft tissue edema throughout bilateral lower extremities and moderate soft tissue edema in the subcutaneous fat of the pelvis and lower abdomen as can be seen with anasarca. 2. Extensive peripheral vascular atherosclerotic disease. 3. Nondisplaced fracture of the medial margin of the right femoral head neck transfixed with 3 cannulated screws. Electronically Signed   By: Kathreen Devoid   On: 11/07/2017 10:06    Scheduled Meds: . amLODipine  10 mg Oral QHS  . carvedilol  50 mg Oral BID  . Chlorhexidine Gluconate Cloth  6 each Topical Q0600  . heparin injection (subcutaneous)  5,000 Units Subcutaneous Q8H  . hydrALAZINE  25 mg Oral Q12H  . insulin aspart  0-15 Units Subcutaneous TID WC  . insulin aspart  0-5 Units Subcutaneous QHS  . insulin aspart protamine- aspart  15 Units Subcutaneous BID AC  . levothyroxine  137 mcg Oral QAC breakfast  . linezolid  600 mg Oral Q12H  . mouth rinse  15 mL Mouth Rinse BID  . multivitamin with minerals  1 tablet Oral Daily  . mupirocin ointment  1 application Nasal BID  . mupirocin ointment   Topical BID  . mycophenolate  500 mg Oral BID  . nystatin   Topical TID  . predniSONE  5 mg Oral Daily  . protein supplement shake  11 oz Oral BID BM  . tacrolimus  0.5 mg Oral QAC breakfast  . tacrolimus  1 mg Oral q1800   Continuous Infusions: . sodium chloride 100 mL/hr at 11/08/17 1851  . ceFEPime (MAXIPIME) IV Stopped (11/08/17 1540)    LOS: 3 days   Kerney Elbe, DO [facility] Pager [phone]  If 7PM-7AM, please contact  night-coverage [email or web address] Password [facility] 11/08/2017, 9:13 PM

## 2017-11-08 NOTE — Care Management Important Message (Signed)
Important Message  Patient Details IM Letter given to Kathy/Case Manager to present to the Patient Name: [name] Kroner MRN: [id number] Date of Birth: [birth year]   Medicare Important Message Given:  Yes    Caren MacadamFuller, Tyrone Pautsch 11/08/2017, 11:53 AMImportant Message  Patient Details  Name: [name] Massey MRN: [id number] Date of Birth: [birth year]   Medicare Important Message Given:  Yes    Caren MacadamFuller, Derwood Becraft 11/08/2017, 11:52 AM

## 2017-11-08 NOTE — Progress Notes (Signed)
Patient ID: [name], female   DOB: Jul 29, 1966, 52 y.o.   MRN: [id number] [facility] Progress Note   Assessment/ Plan:   1.  Acute kidney injury on chronic kidney disease stage III T: most likely vol depletion w/ low UNa, has improved some. Resume IVF"s.  2. 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 3.  Bilateral leg cellulitis: Without any evidence of DVT/PE.  IV abx expanded to include anaerobic coverage in this immunosuppressed patient given the lack of response to rocephin.  4.  Hypertension: Blood pressure stable on BP meds x 3 5.  Anemia: Secondary to chronic kidney disease.  Significantly low iron saturation noted however, apprehensive to give intravenous iron at this time given poorly controlled cellulitis/lower extremity infection. 6.  Hypokalemia: better  Vinson Moselle MD [facility] pgr [phone]   11/08/2017, 3:58 PM      Subjective:   No new c/o's.  Creat unchanged at 2.1.    Objective:   BP (!) 132/54 (BP Location: Left Arm)   Pulse 72   Temp 97.9 F (36.6 C) (Oral)   Resp 18   Ht 5\' 7"  (1.702 m)   Wt 84.3 kg (185 lb 13.6 oz)   LMP 11/19/2011   SpO2 96%   BMI 29.11 kg/m   Intake/Output Summary (Last 24 hours) at 11/08/2017 1556 Last data filed at 11/08/2017 1510 Gross per 24 hour  Intake 680 ml  Output 1075 ml  Net -395 ml   Weight change:   Physical Exam: Gen: Appears to be comfortable resting in bed CVS: Pulse regular rhythm, normal rate, S1 and S2 with ejection systolic murmur Resp: Clear to auscultation, no rales/rhonchi Abd: Soft, obese, nontender Ext: no LE edema, improving bilat LE erythema/tenderness   Imaging: Ct Extrem Lower Wo Cm Bil  Result Date: 11/07/2017 CLINICAL DATA:  Worsening bilateral lower extremity pain swelling and redness over the last several days.  She reports having chronic lower extremity edema that fluctuates, but noticed over the last 3-4 days symptoms progressively worsened. EXAM: CT OF THE LOWER BILATERAL EXTREMITY WITHOUT CONTRAST TECHNIQUE: Multidetector CT imaging of the lower bilateral extremity was performed according to the standard protocol. COMPARISON:  None. FINDINGS: Bones/Joint/Cartilage Generalized osteopenia. Nondisplaced fracture of the medial margin of the right femoral head neck transfixed with 3 cannulated screws. Mild osteoarthritis of the right hip. Normal alignment. No joint effusion. Knee joint spaces are maintained bilaterally. Ankle mortise is normal bilaterally. Subtalar joints are normal bilaterally. Mild osteoarthritis of bilateral sacroiliac joints. No aggressive osseous lesion. Severe degenerative disc disease with disc height loss at L4-5 and L5-S1 with severe bilateral facet arthropathy and foraminal stenosis. Ligaments Ligaments are suboptimally evaluated by CT. Muscles and Tendons Mild generalized symmetric muscle atrophy bilaterally. No intramuscular fluid collection or hematoma. No intramuscular emphysema. Soft tissue Mild soft tissue edema throughout bilateral lower extremities and pelvis most consistent with anasarca. Peripheral vascular atherosclerotic disease. 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 Foley catheter within the bladder. No soft tissue mass. IMPRESSION: 1. Mild soft tissue edema throughout bilateral lower extremities and moderate soft tissue edema in the subcutaneous fat of the pelvis and lower abdomen as can be seen with anasarca. 2. Extensive peripheral vascular atherosclerotic disease. 3. Nondisplaced fracture of the medial margin of the right femoral head neck transfixed with 3 cannulated screws. Electronically Signed   By: Elige Ko   On: 11/07/2017 10:06  Labs: BMET Recent Labs  Lab 11/04/17 1356 11/05/17 0257 11/06/17 0515 11/07/17 0531 11/08/17 0534  NA 135 137 140 137  138 138   140  K 4.4 4.8 3.9 3.4*  3.4* 4.0  4.0  CL 105 107 106 100*  100* 101  103  CO2 19* 17* 24 27  28 27  28   GLUCOSE 363* 368* 120* 151*  152* 271*  276*  BUN 50* 63* 58* 51*  52* 50*  52*  CREATININE 2.63* 2.90* 2.42* 2.14*  2.12* 2.15*  2.14*  CALCIUM 8.3* 8.4* 8.6* 8.3*  8.3* 8.6*  8.7*  PHOS  --   --  3.8 2.5  2.6 2.7  2.7   CBC Recent Labs  Lab 11/04/17 1356 11/05/17 0257 11/06/17 0515 11/07/17 0531 11/07/17 1603 11/08/17 0534  WBC 7.0 8.7 7.7 7.4  --  6.0  NEUTROABS 6.2  --  6.6 6.1  --  4.6  HGB 9.6* 8.4* 9.1* 9.0*  --  8.8*  HCT 31.6* 27.4* 29.2* 28.8*  --  28.6*  MCV 92.1 91.9 91.8 91.1  --  93.2  PLT 129* 136* 128* 125* 159 153    Medications:    . amLODipine  10 mg Oral QHS  . carvedilol  50 mg Oral BID  . Chlorhexidine Gluconate Cloth  6 each Topical Q0600  . heparin injection (subcutaneous)  5,000 Units Subcutaneous Q8H  . hydrALAZINE  25 mg Oral Q12H  . insulin aspart  0-15 Units Subcutaneous TID WC  . insulin aspart  0-5 Units Subcutaneous QHS  . insulin aspart protamine- aspart  15 Units Subcutaneous BID AC  . levothyroxine  137 mcg Oral QAC breakfast  . linezolid  600 mg Oral Q12H  . mouth rinse  15 mL Mouth Rinse BID  . multivitamin with minerals  1 tablet Oral Daily  . mupirocin ointment  1 application Nasal BID  . mupirocin ointment   Topical BID  . mycophenolate  500 mg Oral BID  . nystatin   Topical TID  . predniSONE  5 mg Oral Daily  . protein supplement shake  11 oz Oral BID BM  . tacrolimus  0.5 mg Oral QAC breakfast  . tacrolimus  1 mg Oral q1800

## 2017-11-08 NOTE — Progress Notes (Signed)
Inpatient Diabetes Program Recommendations  AACE/ADA: New Consensus Statement on Inpatient Glycemic Control (2015)  Target Ranges:  Prepandial:   less than 140 mg/dL      Peak postprandial:   less than 180 mg/dL (1-2 hours)      Critically ill patients:  140 - 180 mg/dL   Lab Results  Component Value Date   GLUCAP 285 (H) 11/08/2017   HGBA1C 9.4 (H) 04/25/2017    Review of Glycemic Control  Blood sugars elevated today likely d/t 70/30 not given at Dinner last night. Had low of 50 and pm dose of 70/30 not given. Pt had only eaten 50% of lunch.  Will continue to follow.  Thank you. Ailene Ardshonda Alyla Pietila, RD, LDN, CDE Inpatient Diabetes Coordinator [phone]

## 2017-11-08 NOTE — Progress Notes (Signed)
INFECTIOUS DISEASE PROGRESS NOTE  ID: [name] is a 52 y.o. female with  Principal Problem:   Localized swelling of lower extremity Active Problems:   Immunosuppression (HCC)   Hypothyroidism   H/O kidney transplant   Essential hypertension   Uncontrolled type 1 diabetes mellitus with foot ulcer (HCC)   Anemia of chronic disease  Subjective: Mild pain. Wants to know how much longer she will be in hospital.   Abtx:  Anti-infectives (From admission, onward)   Start     Dose/Rate Route Frequency Ordered Stop   11/08/17 1330  linezolid (ZYVOX) tablet 600 mg     600 mg Oral Every 12 hours 11/08/17 1246     11/07/17 1600  ceFEPIme (MAXIPIME) 2 g in sodium chloride 0.9 % 100 mL IVPB     2 g 200 mL/hr over 30 Minutes Intravenous Every 24 hours 11/07/17 1435     11/07/17 1300  piperacillin-tazobactam (ZOSYN) IVPB 3.375 g  Status:  Discontinued     3.375 g 100 mL/hr over 30 Minutes Intravenous  Once 11/07/17 1227 11/07/17 1427   11/05/17 2200  cefTRIAXone (ROCEPHIN) 1 g in sodium chloride 0.9 % 100 mL IVPB  Status:  Discontinued     1 g 200 mL/hr over 30 Minutes Intravenous Every 24 hours 11/05/17 0801 11/07/17 1214   11/04/17 2245  cefTRIAXone (ROCEPHIN) 1 g in sodium chloride 0.9 % 100 mL IVPB     1 g 200 mL/hr over 30 Minutes Intravenous  Once 11/04/17 2242 11/05/17 0117      Medications:  Scheduled: . amLODipine  10 mg Oral QHS  . carvedilol  50 mg Oral BID  . Chlorhexidine Gluconate Cloth  6 each Topical Q0600  . heparin injection (subcutaneous)  5,000 Units Subcutaneous Q8H  . hydrALAZINE  25 mg Oral Q12H  . insulin aspart  0-15 Units Subcutaneous TID WC  . insulin aspart  0-5 Units Subcutaneous QHS  . insulin aspart protamine- aspart  15 Units Subcutaneous BID AC  . levothyroxine  137 mcg Oral QAC breakfast  . linezolid  600 mg Oral Q12H  . mouth rinse  15 mL Mouth Rinse BID  . multivitamin with minerals  1 tablet Oral Daily  . mupirocin ointment  1  application Nasal BID  . mupirocin ointment   Topical BID  . mycophenolate  500 mg Oral BID  . nystatin   Topical TID  . predniSONE  5 mg Oral Daily  . protein supplement shake  11 oz Oral BID BM  . tacrolimus  0.5 mg Oral QAC breakfast  . tacrolimus  1 mg Oral q1800    Objective: Vital signs in last 24 hours: Temp:  [97.8 F (36.6 C)-98.1 F (36.7 C)] 97.9 F (36.6 C) (03/04 1308) Pulse Rate:  [72-78] 72 (03/04 1308) Resp:  [18-20] 18 (03/04 1308) BP: (127-140)/(54-66) 132/54 (03/04 1308) SpO2:  [92 %-96 %] 96 % (03/04 1308)   General appearance: alert, cooperative and no distress Resp: clear to auscultation bilaterally Cardio: regular rate and rhythm GI: normal findings: bowel sounds normal and soft, non-tender Extremities: R hip with mild erythema. BLE with mild decrease in erythema, tenderness. no fluctuance.   Lab Results Recent Labs    11/07/17 0531 11/08/17 0534  WBC 7.4 6.0  HGB 9.0* 8.8*  HCT 28.8* 28.6*  NA 137  138 138  140  K 3.4*  3.4* 4.0  4.0  CL 100*  100* 101  103  CO2 27  28  27  28  BUN 51*  52* 50*  52*  CREATININE 2.14*  2.12* 2.15*  2.14*   Liver Panel Recent Labs    11/07/17 0531 11/08/17 0534  PROT 5.0* 5.2*  ALBUMIN 1.6*  1.5* 1.6*  1.6*  AST 13* 12*  ALT 8* 9*  ALKPHOS 83 97  BILITOT 0.5 0.9   Sedimentation Rate No results for input(s): ESRSEDRATE in the last 72 hours. C-Reactive Protein No results for input(s): CRP in the last 72 hours.  Microbiology: Recent Results (from the past 240 hour(s))  MRSA PCR Screening     Status: Abnormal   Collection Time: 11/05/17  3:19 AM  Result Value Ref Range Status   MRSA by PCR POSITIVE (A) NEGATIVE Final    Comment:        The GeneXpert MRSA Assay (FDA approved for NASAL specimens only), is one component of a comprehensive MRSA colonization surveillance program. It is not intended to diagnose MRSA infection nor to guide or monitor treatment for MRSA  infections. RESULT CALLED TO, READ BACK BY AND VERIFIED WITH: HOLT,B @ 1514 ON [id number] BY POTEAT,S Performed at [facility], [address] [place], [place] [address]     Studies/Results: Ct Extrem Lower Wo Cm Bil  Result Date: 11/07/2017 CLINICAL DATA:  Worsening bilateral lower extremity pain swelling and redness over the last several days. She reports having chronic lower extremity edema that fluctuates, but noticed over the last 3-4 days symptoms progressively worsened. EXAM: CT OF THE LOWER BILATERAL EXTREMITY WITHOUT CONTRAST TECHNIQUE: Multidetector CT imaging of the lower bilateral extremity was performed according to the standard protocol. COMPARISON:  None. FINDINGS: Bones/Joint/Cartilage Generalized osteopenia. Nondisplaced fracture of the medial margin of the right femoral head neck transfixed with 3 cannulated screws. Mild osteoarthritis of the right hip. Normal alignment. No joint effusion. Knee joint spaces are maintained bilaterally. Ankle mortise is normal bilaterally. Subtalar joints are normal bilaterally. Mild osteoarthritis of bilateral sacroiliac joints. No aggressive osseous lesion. Severe degenerative disc disease with disc height loss at L4-5 and L5-S1 with severe bilateral facet arthropathy and foraminal stenosis. Ligaments Ligaments are suboptimally evaluated by CT. Muscles and Tendons Mild generalized symmetric muscle atrophy bilaterally. No intramuscular fluid collection or hematoma. No intramuscular emphysema. Soft tissue Mild soft tissue edema throughout bilateral lower extremities and pelvis most consistent with anasarca. Peripheral vascular atherosclerotic disease. 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 Foley catheter within the bladder. No soft tissue mass. IMPRESSION: 1. Mild soft tissue edema throughout bilateral lower extremities and moderate soft tissue edema in the subcutaneous fat of the pelvis and lower abdomen as can be seen with anasarca. 2.  Extensive peripheral vascular atherosclerotic disease. 3. Nondisplaced fracture of the medial margin of the right femoral head neck transfixed with 3 cannulated screws. Electronically Signed   By: Elige KoHetal  Patel   On: 11/07/2017 10:06     Assessment/Plan: BLE cellulitis Afebrile, WBC normal By clinical exam, slightly improved.  No change in anbx My great appreciation to pharmacy   DM1 erratic control [phone]) Continues on scheduled and SSI  R THR with pain Await result of MRI  Immunosuppression Awaiting prograf level Would not change her dosing due to transplant (balancing against infection).   Therapeutic drug monitoring Watch PLT count on zyvox.    Total days of antibiotics: 4 (cefepime day 2/zyvox day 1)         Johny SaxJeffrey Monicka Cyran MD, FACP Infectious Diseases (pager) [phone] [email or web address] 11/08/2017, 5:09 PM  LOS: 3 days

## 2017-11-09 ENCOUNTER — Inpatient Hospital Stay (HOSPITAL_COMMUNITY): Payer: Medicare Other

## 2017-11-09 DIAGNOSIS — E1022 Type 1 diabetes mellitus with diabetic chronic kidney disease: Secondary | ICD-10-CM

## 2017-11-09 DIAGNOSIS — M9701XA Periprosthetic fracture around internal prosthetic right hip joint, initial encounter: Secondary | ICD-10-CM

## 2017-11-09 DIAGNOSIS — N189 Chronic kidney disease, unspecified: Secondary | ICD-10-CM

## 2017-11-09 DIAGNOSIS — S72001A Fracture of unspecified part of neck of right femur, initial encounter for closed fracture: Secondary | ICD-10-CM

## 2017-11-09 DIAGNOSIS — R5383 Other fatigue: Secondary | ICD-10-CM

## 2017-11-09 DIAGNOSIS — X58XXXA Exposure to other specified factors, initial encounter: Secondary | ICD-10-CM

## 2017-11-09 DIAGNOSIS — Z5181 Encounter for therapeutic drug level monitoring: Secondary | ICD-10-CM

## 2017-11-09 LAB — COMPREHENSIVE METABOLIC PANEL
ALBUMIN: 1.7 g/dL — AB (ref 3.5–5.0)
ALK PHOS: 90 U/L (ref 38–126)
ALT: 9 U/L — AB (ref 14–54)
AST: 13 U/L — ABNORMAL LOW (ref 15–41)
Anion gap: 7 (ref 5–15)
BILIRUBIN TOTAL: 0.5 mg/dL (ref 0.3–1.2)
BUN: 45 mg/dL — AB (ref 6–20)
CALCIUM: 8.7 mg/dL — AB (ref 8.9–10.3)
CO2: 28 mmol/L (ref 22–32)
Chloride: 102 mmol/L (ref 101–111)
Creatinine, Ser: 1.93 mg/dL — ABNORMAL HIGH (ref 0.44–1.00)
GFR calc Af Amer: 34 mL/min — ABNORMAL LOW (ref >60)
GFR calc non Af Amer: 29 mL/min — ABNORMAL LOW (ref >60)
GLUCOSE: 73 mg/dL (ref 65–99)
Potassium: 3.7 mmol/L (ref 3.5–5.1)
Sodium: 137 mmol/L (ref 135–145)
TOTAL PROTEIN: 5.3 g/dL — AB (ref 6.5–8.1)

## 2017-11-09 LAB — CBC WITH DIFFERENTIAL/PLATELET
BASOS PCT: 0 %
Basophils Absolute: 0 10*3/uL (ref 0.0–0.1)
EOS ABS: 0.1 10*3/uL (ref 0.0–0.7)
EOS PCT: 1 %
HCT: 30 % — ABNORMAL LOW (ref 36.0–46.0)
Hemoglobin: 9.2 g/dL — ABNORMAL LOW (ref 12.0–15.0)
LYMPHS PCT: 14 %
Lymphs Abs: 1 10*3/uL (ref 0.7–4.0)
MCH: 28 pg (ref 26.0–34.0)
MCHC: 30.7 g/dL (ref 30.0–36.0)
MCV: 91.2 fL (ref 78.0–100.0)
MONO ABS: 0.6 10*3/uL (ref 0.1–1.0)
Monocytes Relative: 8 %
Neutro Abs: 5.3 10*3/uL (ref 1.7–7.7)
Neutrophils Relative %: 77 %
PLATELETS: 210 10*3/uL (ref 150–400)
RBC: 3.29 MIL/uL — ABNORMAL LOW (ref 3.87–5.11)
RDW: 14.1 % (ref 11.5–15.5)
WBC: 6.9 10*3/uL (ref 4.0–10.5)

## 2017-11-09 LAB — GLUCOSE, CAPILLARY
Glucose-Capillary: 79 mg/dL (ref 65–99)
Glucose-Capillary: 64 mg/dL — ABNORMAL LOW (ref 65–99)
Glucose-Capillary: 105 mg/dL — ABNORMAL HIGH (ref 65–99)
Glucose-Capillary: 117 mg/dL — ABNORMAL HIGH (ref 65–99)

## 2017-11-09 LAB — PHOSPHORUS: Phosphorus: 2.1 mg/dL — ABNORMAL LOW (ref 2.5–4.6)

## 2017-11-09 LAB — MAGNESIUM: Magnesium: 1.8 mg/dL (ref 1.7–2.4)

## 2017-11-09 NOTE — Progress Notes (Signed)
Patient ID: [name], female   DOB: 1965-10-26, 52 y.o.   MRN: [id number] [facility] Progress Note   Assessment/ Plan:   1.  Acute kidney injury on chronic kidney disease stage III (renal Tx): low UNa and exam c/w vol depletion.  Creat back down with IVF"s to 1.9.  Baseline creat 1.5- 1.8.  Recommend cont IVF another 24-48hrs.  2. Metabolic acidosis: resolved 3.  Bilateral leg cellulitis: Without any evidence of DVT/PE.  IV abx expanded 4.  Hypertension: Blood pressure stable on BP meds x 3 5.  Anemia: Secondary to chronic kidney disease.  Significantly low iron saturation noted however, apprehensive to give intravenous iron at this time given poorly controlled cellulitis/lower extremity infection. 6.  Hypokalemia: better 7.  Mouth pain: no thrush, she thinks this is due to her false teeth  Rec: will sign off   Vinson Moselleob Anise Harbin MD [facility] pgr [phone]   11/09/2017, 3:13 PM      Subjective:   No new c/o's. Creat down 1.9.  Not able to eat , has "sores" in her mouth   Objective:   BP (!) 155/79 (BP Location: Left Arm)   Pulse (!) 59   Temp 97.9 F (36.6 C) (Oral)   Resp 18   Ht 5\' 7"  (1.702 m)   Wt 84.3 kg (185 lb 13.6 oz)   LMP 11/19/2011   SpO2 97%   BMI 29.11 kg/m   Intake/Output Summary (Last 24 hours) at 11/09/2017 1513 Last data filed at 11/09/2017 0600 Gross per 24 hour  Intake 1235 ml  Output 700 ml  Net 535 ml   Weight change:   Physical Exam: Gen: Appears to be comfortable resting in bed Mouth: one ulceration inside of upper lip, no thrush CVS: Pulse regular rhythm, normal rate, S1 and S2 with ejection systolic murmur Resp: Clear to auscultation, no rales/rhonchi Abd: Soft, obese, nontender Ext: no LE edema, improving bilat LE erythema/tenderness   Imaging: Mr Hip Right Wo Contrast  Result Date: 11/08/2017 CLINICAL DATA:  Bilateral lower extremity pain, swelling and erythema for several days. Patient reports  chronic lower extremity edema. History of chronic kidney disease, diabetes, renal transplant and hypertension. EXAM: MR OF THE RIGHT HIP WITHOUT CONTRAST TECHNIQUE: Multiplanar, multisequence MR imaging was performed. No intravenous contrast was administered. COMPARISON:  Lower extremity CT 11/07/2017.  Pelvic CT 11/05/2017. FINDINGS: Bones: Status post fixation of the right femoral neck with 3 cortical screws. The hardware is in stable position. No displaced fracture. There is ill-defined low T1 and high T2 signal in the left superior pubic ramus, best seen on coronal images 17 and 18, likely a nondisplaced fracture. The inferior pubic ramus appears intact. The sacrum and sacroiliac joints appear intact. There are mild degenerative changes of both hips with small bilateral hip joint effusions. No evidence of femoral head avascular necrosis. Articular cartilage and labrum Articular cartilage:  No focal chondral defect. Labrum:  No gross labral tear or paralabral cyst. Joint or bursal effusion Joint effusion:  Small bilateral hip joint effusions. Bursae: No focal bursal fluid collection. Muscles and tendons Muscles and tendons: Generalized edema throughout the visualized pelvic and proximal thigh musculature. No focal intramuscular fluid collection or acute tendon findings identified. Other findings Miscellaneous: As on previous CT, generalized anasarca with diffuse edema throughout the subcutaneous and pelvic fat. Small amount of pelvic ascites. Transplant kidney in the right iliac fossa appears unchanged. IMPRESSION: 1. Nonspecific generalized soft tissue edema involving the musculature and subcutaneous  and deep fat, consistent with nonspecific anasarca. Ascites without focal fluid collection. 2. Previous right hip pinning without displaced fracture. Marrow edema in the left superior pubic ramus suspicious for a nondisplaced fracture. The pelvis otherwise appears intact. Electronically Signed   By: Carey Bullocks  M.D.   On: 11/08/2017 17:18    Labs: BMET Recent Labs  Lab 11/04/17 1356 11/05/17 0257 11/06/17 0515 11/07/17 0531 11/08/17 0534 11/09/17 0530  NA 135 137 140 137  138 138  140 137  K 4.4 4.8 3.9 3.4*  3.4* 4.0  4.0 3.7  CL 105 107 106 100*  100* 101  103 102  CO2 19* 17* 24 27  28 27  28 28   GLUCOSE 363* 368* 120* 151*  152* 271*  276* 73  BUN 50* 63* 58* 51*  52* 50*  52* 45*  CREATININE 2.63* 2.90* 2.42* 2.14*  2.12* 2.15*  2.14* 1.93*  CALCIUM 8.3* 8.4* 8.6* 8.3*  8.3* 8.6*  8.7* 8.7*  PHOS  --   --  3.8 2.5  2.6 2.7  2.7 2.1*   CBC Recent Labs  Lab 11/06/17 0515 11/07/17 0531 11/07/17 1603 11/08/17 0534 11/09/17 0530  WBC 7.7 7.4  --  6.0 6.9  NEUTROABS 6.6 6.1  --  4.6 5.3  HGB 9.1* 9.0*  --  8.8* 9.2*  HCT 29.2* 28.8*  --  28.6* 30.0*  MCV 91.8 91.1  --  93.2 91.2  PLT 128* 125* 159 153 210    Medications:    . amLODipine  10 mg Oral QHS  . carvedilol  50 mg Oral BID  . Chlorhexidine Gluconate Cloth  6 each Topical Q0600  . heparin injection (subcutaneous)  5,000 Units Subcutaneous Q8H  . hydrALAZINE  25 mg Oral Q12H  . insulin aspart  0-15 Units Subcutaneous TID WC  . insulin aspart  0-5 Units Subcutaneous QHS  . insulin aspart protamine- aspart  15 Units Subcutaneous BID AC  . levothyroxine  137 mcg Oral QAC breakfast  . linezolid  600 mg Oral Q12H  . mouth rinse  15 mL Mouth Rinse BID  . multivitamin with minerals  1 tablet Oral Daily  . mupirocin ointment  1 application Nasal BID  . mycophenolate  500 mg Oral BID  . nystatin   Topical TID  . predniSONE  5 mg Oral Daily  . protein supplement shake  11 oz Oral BID BM  . tacrolimus  0.5 mg Oral QAC breakfast  . tacrolimus  1 mg Oral q1800

## 2017-11-09 NOTE — Consult Note (Signed)
WOC Nurse wound follow up Wound type:Ongoing pain and erythema to lower legs.  No open areas.  Have healed with mupirocin ointment.  Can leave open to air. Legs are improved.  Remains painful.  \ Will not follow at this time.  Please re-consult if needed.  Maple HudsonKaren Anne Boltz RN BSN [facility] Pager [phone]

## 2017-11-09 NOTE — Progress Notes (Signed)
INFECTIOUS DISEASE PROGRESS NOTE  ID: [name] is a 52 y.o. female with  Principal Problem:   Localized swelling of lower extremity Active Problems:   Immunosuppression (HCC)   Hypothyroidism   H/O kidney transplant   Essential hypertension   Uncontrolled type 1 diabetes mellitus with foot ulcer (HCC)   Anemia of chronic disease  Subjective: C/o fatigue, decreased appetite.   Abtx:  Anti-infectives (From admission, onward)   Start     Dose/Rate Route Frequency Ordered Stop   11/08/17 1330  linezolid (ZYVOX) tablet 600 mg     600 mg Oral Every 12 hours 11/08/17 1246     11/07/17 1600  ceFEPIme (MAXIPIME) 2 g in sodium chloride 0.9 % 100 mL IVPB     2 g 200 mL/hr over 30 Minutes Intravenous Every 24 hours 11/07/17 1435     11/07/17 1300  piperacillin-tazobactam (ZOSYN) IVPB 3.375 g  Status:  Discontinued     3.375 g 100 mL/hr over 30 Minutes Intravenous  Once 11/07/17 1227 11/07/17 1427   11/05/17 2200  cefTRIAXone (ROCEPHIN) 1 g in sodium chloride 0.9 % 100 mL IVPB  Status:  Discontinued     1 g 200 mL/hr over 30 Minutes Intravenous Every 24 hours 11/05/17 0801 11/07/17 1214   11/04/17 2245  cefTRIAXone (ROCEPHIN) 1 g in sodium chloride 0.9 % 100 mL IVPB     1 g 200 mL/hr over 30 Minutes Intravenous  Once 11/04/17 2242 11/05/17 0117      Medications:  Scheduled: . amLODipine  10 mg Oral QHS  . carvedilol  50 mg Oral BID  . Chlorhexidine Gluconate Cloth  6 each Topical Q0600  . heparin injection (subcutaneous)  5,000 Units Subcutaneous Q8H  . hydrALAZINE  25 mg Oral Q12H  . insulin aspart  0-15 Units Subcutaneous TID WC  . insulin aspart  0-5 Units Subcutaneous QHS  . insulin aspart protamine- aspart  15 Units Subcutaneous BID AC  . levothyroxine  137 mcg Oral QAC breakfast  . linezolid  600 mg Oral Q12H  . mouth rinse  15 mL Mouth Rinse BID  . multivitamin with minerals  1 tablet Oral Daily  . mupirocin ointment  1 application Nasal BID  . mycophenolate   500 mg Oral BID  . nystatin   Topical TID  . predniSONE  5 mg Oral Daily  . protein supplement shake  11 oz Oral BID BM  . tacrolimus  0.5 mg Oral QAC breakfast  . tacrolimus  1 mg Oral q1800    Objective: Vital signs in last 24 hours: Temp:  [97.9 F (36.6 C)-98.3 F (36.8 C)] 98.3 F (36.8 C) (03/05 1524) Pulse Rate:  [59-78] 75 (03/05 1524) Resp:  [18-19] 18 (03/05 1524) BP: (118-155)/(55-79) 130/65 (03/05 1524) SpO2:  [94 %-97 %] 94 % (03/05 1524)   General appearance: alert and no distress Resp: clear to auscultation bilaterally Cardio: regular rate and rhythm GI: normal findings: bowel sounds normal and soft, non-tender Extremities: BLE less tender, less erythema, no heat.   Lab Results Recent Labs    11/08/17 0534 11/09/17 0530  WBC 6.0 6.9  HGB 8.8* 9.2*  HCT 28.6* 30.0*  NA 138  140 137  K 4.0  4.0 3.7  CL 101  103 102  CO2 27  28 28   BUN 50*  52* 45*  CREATININE 2.15*  2.14* 1.93*   Liver Panel Recent Labs    11/08/17 0534 11/09/17 0530  PROT 5.2* 5.3*  ALBUMIN 1.6*  1.6* 1.7*  AST 12* 13*  ALT 9* 9*  ALKPHOS 97 90  BILITOT 0.9 0.5   Sedimentation Rate No results for input(s): ESRSEDRATE in the last 72 hours. C-Reactive Protein No results for input(s): CRP in the last 72 hours.  Microbiology: Recent Results (from the past 240 hour(s))  MRSA PCR Screening     Status: Abnormal   Collection Time: 11/05/17  3:19 AM  Result Value Ref Range Status   MRSA by PCR POSITIVE (A) NEGATIVE Final    Comment:        The GeneXpert MRSA Assay (FDA approved for NASAL specimens only), is one component of a comprehensive MRSA colonization surveillance program. It is not intended to diagnose MRSA infection nor to guide or monitor treatment for MRSA infections. RESULT CALLED TO, READ BACK BY AND VERIFIED WITH: HOLT,B @ 1514 ON [id number] BY POTEAT,S Performed at [facility], [address] [place], [place] [address]   Culture,  blood (Routine X 2) w Reflex to ID Panel     Status: None (Preliminary result)   Collection Time: 11/07/17  2:59 PM  Result Value Ref Range Status   Specimen Description   Final    LEFT ANTECUBITAL Performed at [facility], [address], [place], [place] [address]    Special Requests   Final    IN PEDIATRIC BOTTLE Blood Culture adequate volume Performed at [facility], [address], [place], [place] [address]    Culture   Final    NO GROWTH 1 DAY Performed at [facility], [address] [place], [place] [address]    Report Status PENDING  Incomplete  Culture, blood (single)     Status: None (Preliminary result)   Collection Time: 11/07/17  3:12 PM  Result Value Ref Range Status   Specimen Description   Final    BLOOD LEFT HAND Performed at [facility], [address] [place], [place] [address]    Special Requests   Final    BOTTLES DRAWN AEROBIC ONLY Blood Culture adequate volume Performed at [facility], [address] [place], [place] [address]    Culture   Final    NO GROWTH 1 DAY Performed at [facility], [address] [place], [place] [address]    Report Status PENDING  Incomplete    Studies/Results: Mr Hip Right Wo Contrast  Result Date: 11/08/2017 CLINICAL DATA:  Bilateral lower extremity pain, swelling and erythema for several days. Patient reports chronic lower extremity edema. History of chronic kidney disease, diabetes, renal transplant and hypertension. EXAM: MR OF THE RIGHT HIP WITHOUT CONTRAST TECHNIQUE: Multiplanar, multisequence MR imaging was performed. No intravenous contrast was administered. COMPARISON:  Lower extremity CT 11/07/2017.  Pelvic CT 11/05/2017. FINDINGS: Bones: Status post fixation of the right femoral neck with 3 cortical screws. The hardware is in stable position. No displaced fracture. There is ill-defined low T1 and high T2 signal in the left  superior pubic ramus, best seen on coronal images 17 and 18, likely a nondisplaced fracture. The inferior pubic ramus appears intact. The sacrum and sacroiliac joints appear intact. There are mild degenerative changes of both hips with small bilateral hip joint effusions. No evidence of femoral head avascular necrosis. Articular cartilage and labrum Articular cartilage:  No focal chondral defect. Labrum:  No gross labral tear or paralabral cyst. Joint or bursal effusion Joint effusion:  Small bilateral hip joint effusions.  Bursae: No focal bursal fluid collection. Muscles and tendons Muscles and tendons: Generalized edema throughout the visualized pelvic and proximal thigh musculature. No focal intramuscular fluid collection or acute tendon findings identified. Other findings Miscellaneous: As on previous CT, generalized anasarca with diffuse edema throughout the subcutaneous and pelvic fat. Small amount of pelvic ascites. Transplant kidney in the right iliac fossa appears unchanged. IMPRESSION: 1. Nonspecific generalized soft tissue edema involving the musculature and subcutaneous and deep fat, consistent with nonspecific anasarca. Ascites without focal fluid collection. 2. Previous right hip pinning without displaced fracture. Marrow edema in the left superior pubic ramus suspicious for a nondisplaced fracture. The pelvis otherwise appears intact. Electronically Signed   By: Carey Bullocks M.D.   On: 11/08/2017 17:18     Assessment/Plan: BLE cellulitis Afebrile, WBC normal By clinical exam, continues to improve.  No change in anbx, aim for 10 days total My great appreciation to pharmacy   DM1 control improved (16-109) Continues on scheduled and SSI  R THR with pain Await result of MRI- anasarca with non-displaced fracture.   Immunosuppression Awaiting prograf level Would not change her dosing due to transplant (balancing against infection).   Therapeutic drug monitoring Watch PLT  count on zyvox.   CKD Cr continues to improve.   available as needed.   Total days of antibiotics: 5 (cefepime day 3/zyvox day 2)         Johny Sax MD, FACP Infectious Diseases (pager) [phone] [email or web address] 11/09/2017, 4:58 PM  LOS: 4 days

## 2017-11-09 NOTE — Progress Notes (Signed)
PROGRESS NOTE    [name]  AJO:[id number] DOB: 12-02-1965 DOA: 11/04/2017 PCP: Hayden Rasmussen, MD   Brief Narrative:  HPI per Dr. Tamala Julian: [name] is a 52 y.o. female with medical history significant of HTN, hypothyroidism, s/p renal transplant on chronic immunosuppressive therapy, DM type I, depression, history of DVT not currently on anticoagulation; who presented with complaints of worsening bilateral lower extremity pain swelling and redness over the last several days.  She reports having chronic lower extremity edema that fluctuates, but noticed over the last 3-4 days symptoms progressively worsened.  Notes developing redness and increased warmth of both lower legs, but worse on the right.  Tried taking Tylenol without relief of pain.  Pain was so severe that she was unable to ambulate and felt that she may fall if she tried to stand up.  Denies having any recent trauma or scrapes to the leg, chest pain, fever, black/bloody stools, or dysuria.  She also complained of persistently elevated blood sugars into the 300s.  ED Course: Upon admission to the emergency department patient was noted to have stable vital signs.  Labs revealed WBC 7, hemoglobin 9.6, platelets 129, BUN 50, creatinine 2.63, glucose 363,  and lactic acid 1.53.  Patient was given 500 mL of normal saline IV fluid and Rocephin.  TRH called to admit.  *Patient complaining of Right Hip Pain and Erythema so ordered Hip MRI. Still complaining of LE Pain; Orthopedic's consulted for evaluation of her Right Hip.   Assessment & Plan:   Principal Problem:   Localized swelling of lower extremity Active Problems:   Immunosuppression (HCC)   Hypothyroidism   H/O kidney transplant   Essential hypertension   Uncontrolled type 1 diabetes mellitus with foot ulcer (HCC)   Anemia of chronic disease  Bilateral Lower extremity Edema 2/2 Cellulitis (DVT essentially ruled out) -Acute. Given patient previous history of DVT  high suspicion for recurrence but essentially ruled out  -Placed on empiric antibiotics of Rocephin for the possibility also of cellulitis but changed by Infectious Diseases  -Admitted a telemetry bed -C/w IV Cefepime and po Zyvox by ID -Check D-Dimer and was 4.10, ESR was 100, CRP was 32.3 -Checked Doppler ultrasound of lower extremities and did not show acute DVT -Heparin gtt started and dosed per pharmacy but discontinued as VQ Scan was Normal and LE DVT Negative -VQ Scan done given Elevated D-Dimer of 4 but was Normal -CT Abd/Pelvis done and showed no mass or intraabdominal or intrapevlic abnormalities -Unfortunately cannot get CT Abd/Pelvis with Contrast due to Renal Fxn to evaluate for ? Iliac Blockages -CT of Legs w/o Contrast to evaluate showed Mild soft tissue edema throughout bilateral lower extremities and moderate soft tissue edema in the subcutaneous fat of the pelvis and lower abdomen as can be seen with anasarca.  Extensive peripheral vascular atherosclerotic disease.  Nondisplaced fracture of the medial margin of the right femoral head neck transfixed with 3 cannulated screws. -ID Consulted for further evaluation and recommendations; Recommending Checking Blood Cx and Keeping Legs elevated   -Blood Cx x2 Showed NGTD 1 day -Discussed Case with Hematology who recommended obtaining DIC Panel as she may have some DIC; Fibrinogen was slightly elevated but likely related to Infection -WOC Nurse recommending Mupirocin ointment -Will need PT/OT Evaluation    Right Hip Erythema and Warmth -Concerned for Hardware infection -Discussed with Ortho PA who recommended MRI of Hip (without Contrast given 1 kidney) -MRI showed Nonspecific generalized soft tissue edema involving the  musculature and subcutaneous and deep fat, consistent with nonspecific anasarca. Ascites without focal fluid collection.  Previous right hip pinning without displaced fracture. Marrow edema in the left superior pubic  ramus suspicious for a nondisplaced fracture. The pelvis otherwise appears intact. -Orthopedic Consulted for evaluation and appreciate Dr. Sid Falcon recommendations -He feels Area over the Right Greater Trocanter was consistent with Early Cellulitis vs Early Pressure Sore; Does not feel clinically or on imaging she has Septic Hip Arthritis and even though her Fx has not completely healed he feels like she needs exchange of one of her screws in the future  Acute renal failure on chronic kidney disease Stage 3, s/p renal transplant on chronic immunosuppressive therapy, improving  -Patient baseline creatinine previously noted to be around 1.5-1.8, but presents with a creatinine of 2.63 and BUN 50 on admission  Elevated BUN to creatinine ratio suggest prerenal cause of symptoms.  -BUN/Cr now improved to 45/1.93 -Nephrologyfor further evaluation and recommendations and appreciate additional recc's -Checked Prograf level was 3.2 -Continue CellCept, Prograf, Prednisone -IVF restarted by Nephro Dr. Jonnie Finner with 1/2 NS at 100 mL/hr and recommending IVF for another 24-48 hours  -Renal U/S Negative ultrasound appearance of the right lower quadrant transplant kidney. -CT Abd/Pelvis showed Atrophic native kidneys with unremarkable appearing transplant kidney in RIGHT iliac fossa. -Strict I's/O's, Daily Weights -U/A showed showed Amber Color Urine, Small Hb, >300 Protein; WBC was 6-30 -Placed Foley Catheter for accurate I's/O's and will need to D/C soon  -Repeat CMP in AM and defer to Nephro for further recc's  Diabetes mellitus type 1 with hyperglycemia, uncontrolled -Patient's last hemoglobin A1c noted to be 9.9 on 04/2017.  Initial glucose 363 without elevated anion gap. -C/w Hypoglycemic protocol -Decreased home insulin regimen to 15 units BID -CBGs q. before meals and at bedtime with moderate SSI -Diabetic Education Coordinator Consulted  -CBG's ranging from 64-117  Anemia of Chronic Disease:    -Hemoglobin appears to be near patient's baseline of 8-9 g/dL -Hb/Hct stable at 9.2/30.0 -Iron Studies being checked by Nephrology and showed Iron Level of 5, UIBC of 117, TIBC of 122, Saturation of 4, Ferritin 333 -Continue to Monitor for S/Sx of Bleeding as patient was just empirically heparinized  -Repeat CBC in AM  Essential Hypertension -Continue Coreg, hydralazine, and amlodipine as tolerated -Avoid Hypotension   Hypothyroidism -Previously noted to be not well controlled back in 2018. -Add on TSH was elevated at 8.857; Free T4 was 1.17 -Continue Synthroid at current dose -Repeat TSH and Free T4 in 5-6 weeks after acute illness   History of DVT -Patient with previous history of DVT but appears to have been provoked not on anticoagulation at this time. 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 -Now improved and off of Bicarbonate Infusion    LE Rash -Has splotchy areas of redness that are itchy -Give Benadryl 12.5 mg IV q6h -Started Triamcinolone 01.% : Eucerin Cream 1:1 Topically -Try Hydroxyzine for Itching but will need to get EKG first to evaluate for Long QTc  Hypokalemia -K+ was 3.4 and improved to 3.7 -Continue to Monitor an Replete as Necessary -Repeat Phos Level in AM  Hypophosphatemia -Patient's Phos Level was 2.1 -Continue to Monitor and Replete in AM if still low  DVT prophylaxis: Heparin 5,000 units sq q8h Code Status: FULL CODE Family Communication: No family present at bedside Disposition Plan: [facility] for further workup and evaluation   Consultants:  Nephrology   Infectious Diseases   Orthopedic Surgery    Procedures: V/Q Scan; CT of LE Extremities, MRI of Hip   Antimicrobials:  Anti-infectives (From admission, onward)   Start     Dose/Rate Route Frequency Ordered Stop   11/08/17 1330  linezolid (ZYVOX) tablet 600 mg     600 mg Oral Every 12 hours  11/08/17 1246     11/07/17 1600  ceFEPIme (MAXIPIME) 2 g in sodium chloride 0.9 % 100 mL IVPB     2 g 200 mL/hr over 30 Minutes Intravenous Every 24 hours 11/07/17 1435     11/07/17 1300  piperacillin-tazobactam (ZOSYN) IVPB 3.375 g  Status:  Discontinued     3.375 g 100 mL/hr over 30 Minutes Intravenous  Once 11/07/17 1227 11/07/17 1427   11/05/17 2200  cefTRIAXone (ROCEPHIN) 1 g in sodium chloride 0.9 % 100 mL IVPB  Status:  Discontinued     1 g 200 mL/hr over 30 Minutes Intravenous Every 24 hours 11/05/17 0801 11/07/17 1214   11/04/17 2245  cefTRIAXone (ROCEPHIN) 1 g in sodium chloride 0.9 % 100 mL IVPB     1 g 200 mL/hr over 30 Minutes Intravenous  Once 11/04/17 2242 11/05/17 0117     Subjective: Seen and examined at bedside and was fatigued and still complaining of LE Pain. No nausea or vomiting. No CP or SOB and states rash in LE is still itching slightly but improved.   Objective: Vitals:   11/09/17 0425 11/09/17 1506 11/09/17 1524 11/09/17 2055  BP: (!) 118/59 (!) 155/79 130/65 116/71  Pulse: 73 (!) 59 75 73  Resp: '18 18 18 18  ' Temp: 98.3 F (36.8 C) 97.9 F (36.6 C) 98.3 F (36.8 C) 97.8 F (36.6 C)  TempSrc: Oral Oral Oral Oral  SpO2: 95% 97% 94%   Weight:      Height:        Intake/Output Summary (Last 24 hours) at 11/09/2017 2145 Last data filed at 11/09/2017 1500 Gross per 24 hour  Intake 2135 ml  Output 700 ml  Net 1435 ml   Filed Weights   11/05/17 0235 11/06/17 0504  Weight: 82.4 kg (181 lb 9.6 oz) 84.3 kg (185 lb 13.6 oz)   Examination: Physical Exam:  Constitutional: WN/WD Chronically ill appearing Caucasian female in NAD appears calm but slightly uncomfortable  Eyes: Sclerae anicteric. Lids Normal ENMT: External Ears and nose appear normal; Poor Dentition  Neck: Supple with no JVD Respiratory: Diminished to Auscultation bilaterally; No appreciable wheezing/rales/rhonchi Cardiovascular: RRR; no m/r/g. 1+ LE Edema Abdomen: Soft, NT, ND. Bowel sounds  present GU: Deferred; Had foley in placer Musculoskeletal: No contractures; No cyanosis Skin: Right Hip had erythema and some warmth. LE looking improved and drying out but still erythematous and Right wound was draining some. Multiple tattoos throughout body; Splotchy rash medial thighs and legs improved slightly  Neurologic: CN 2-12 grossly intact Psychiatric: Normal mood and affect. Intact judgement and insight  Data Reviewed: I have personally reviewed following labs and imaging studies  CBC: Recent Labs  Lab 11/04/17 1356 11/05/17 0257 11/06/17 0515 11/07/17 0531 11/07/17 1603 11/08/17 0534 11/09/17 0530  WBC 7.0 8.7 7.7 7.4  --  6.0 6.9  NEUTROABS 6.2  --  6.6 6.1  --  4.6 5.3  HGB 9.6* 8.4* 9.1* 9.0*  --  8.8* 9.2*  HCT 31.6* 27.4* 29.2* 28.8*  --  28.6* 30.0*  MCV 92.1 91.9 91.8 91.1  --  93.2 91.2  PLT 129* 136*  128* 125* 159 153 168   Basic Metabolic Panel: Recent Labs  Lab 11/05/17 0257 11/06/17 0515 11/07/17 0531 11/08/17 0534 11/09/17 0530  NA 137 140 137  138 138  140 137  K 4.8 3.9 3.4*  3.4* 4.0  4.0 3.7  CL 107 106 100*  100* 101  103 102  CO2 17* '24 27  28 27  28 28  ' GLUCOSE 368* 120* 151*  152* 271*  276* 73  BUN 63* 58* 51*  52* 50*  52* 45*  CREATININE 2.90* 2.42* 2.14*  2.12* 2.15*  2.14* 1.93*  CALCIUM 8.4* 8.6* 8.3*  8.3* 8.6*  8.7* 8.7*  MG  --  1.9 1.8 1.9 1.8  PHOS  --  3.8 2.5  2.6 2.7  2.7 2.1*   GFR: Estimated Creatinine Clearance: 38.9 mL/min (A) (by C-G formula based on SCr of 1.93 mg/dL (H)). Liver Function Tests: Recent Labs  Lab 11/04/17 1356 11/06/17 0515 11/07/17 0531 11/08/17 0534 11/09/17 0530  AST 20 12* 13* 12* 13*  ALT 11* 9* 8* 9* 9*  ALKPHOS 86 79 83 97 90  BILITOT 1.3* 0.5 0.5 0.9 0.5  PROT 6.1* 5.0* 5.0* 5.2* 5.3*  ALBUMIN 2.1* 1.7* 1.6*  1.5* 1.6*  1.6* 1.7*   No results for input(s): LIPASE, AMYLASE in the last 168 hours. No results for input(s): AMMONIA in the last 168  hours. Coagulation Profile: Recent Labs  Lab 11/05/17 0257 11/07/17 1603  INR 1.07 0.96   Cardiac Enzymes: No results for input(s): CKTOTAL, CKMB, CKMBINDEX, TROPONINI in the last 168 hours. BNP (last 3 results) No results for input(s): PROBNP in the last 8760 hours. HbA1C: No results for input(s): HGBA1C in the last 72 hours. CBG: Recent Labs  Lab 11/08/17 2113 11/09/17 0810 11/09/17 1158 11/09/17 1641 11/09/17 2105  GLUCAP 98 79 64* 105* 117*   Lipid Profile: No results for input(s): CHOL, HDL, LDLCALC, TRIG, CHOLHDL, LDLDIRECT in the last 72 hours. Thyroid Function Tests: No results for input(s): TSH, T4TOTAL, FREET4, T3FREE, THYROIDAB in the last 72 hours. Anemia Panel: No results for input(s): VITAMINB12, FOLATE, FERRITIN, TIBC, IRON, RETICCTPCT in the last 72 hours. Sepsis Labs: Recent Labs  Lab 11/04/17 1414  LATICACIDVEN 1.53    Recent Results (from the past 240 hour(s))  MRSA PCR Screening     Status: Abnormal   Collection Time: 11/05/17  3:19 AM  Result Value Ref Range Status   MRSA by PCR POSITIVE (A) NEGATIVE Final    Comment:        The GeneXpert MRSA Assay (FDA approved for NASAL specimens only), is one component of a comprehensive MRSA colonization surveillance program. It is not intended to diagnose MRSA infection nor to guide or monitor treatment for MRSA infections. RESULT CALLED TO, READ BACK BY AND VERIFIED WITH: HOLT,B @ 3729 ON [id number] BY POTEAT,S Performed at [facility] [address] [place], [place] [address]   Culture, blood (Routine X 2) w Reflex to ID Panel     Status: None (Preliminary result)   Collection Time: 11/07/17  2:59 PM  Result Value Ref Range Status   Specimen Description   Final    LEFT ANTECUBITAL Performed at [facility] [address], [place], [place] [address]    Special Requests   Final    IN PEDIATRIC BOTTLE Blood Culture adequate volume Performed at [facility] [address], [place], [place] [address]    Culture   Final  NO GROWTH 1 DAY Performed at [facility], [place] [address], [place], [place] [address]    Report Status PENDING  Incomplete  Culture, blood (single)     Status: None (Preliminary result)   Collection Time: 11/07/17  3:12 PM  Result Value Ref Range Status   Specimen Description   Final    BLOOD LEFT HAND Performed at [facility] [address], [place], [place] [address]    Special Requests   Final    BOTTLES DRAWN AEROBIC ONLY Blood Culture adequate volume Performed at [facility] [address], [place], [place] [address]    Culture   Final    NO GROWTH 1 DAY Performed at [facility], [place] [address], [place],  [address]    Report Status PENDING  Incomplete     Radiology Studies: Mr Hip Right Wo Contrast  Result Date: 11/08/2017 CLINICAL DATA:  Bilateral lower extremity pain, swelling and erythema for several days. Patient reports chronic lower extremity edema. History of chronic kidney disease, diabetes, renal transplant and hypertension. EXAM: MR OF THE RIGHT HIP WITHOUT CONTRAST TECHNIQUE: Multiplanar, multisequence MR imaging was performed. No intravenous contrast was administered. COMPARISON:  Lower extremity CT 11/07/2017.  Pelvic CT 11/05/2017. FINDINGS: Bones: Status post fixation of the right femoral neck with 3 cortical screws. The hardware is in stable position. No displaced fracture. There is ill-defined low T1 and high T2 signal in the left superior pubic ramus, best seen on coronal images 17 and 18, likely a nondisplaced fracture. The inferior pubic ramus appears intact. The sacrum and sacroiliac joints appear intact. There are mild degenerative changes of both hips with small bilateral hip joint effusions. No evidence of femoral head avascular necrosis. Articular cartilage and labrum Articular cartilage:  No focal chondral defect.  Labrum:  No gross labral tear or paralabral cyst. Joint or bursal effusion Joint effusion:  Small bilateral hip joint effusions. Bursae: No focal bursal fluid collection. Muscles and tendons Muscles and tendons: Generalized edema throughout the visualized pelvic and proximal thigh musculature. No focal intramuscular fluid collection or acute tendon findings identified. Other findings Miscellaneous: As on previous CT, generalized anasarca with diffuse edema throughout the subcutaneous and pelvic fat. Small amount of pelvic ascites. Transplant kidney in the right iliac fossa appears unchanged. IMPRESSION: 1. Nonspecific generalized soft tissue edema involving the musculature and subcutaneous and deep fat, consistent with nonspecific anasarca. Ascites without focal fluid collection. 2. Previous right hip pinning without displaced fracture. Marrow edema in the left superior pubic ramus suspicious for a nondisplaced fracture. The pelvis otherwise appears intact. Electronically Signed   By: Richardean Sale M.D.   On: 11/08/2017 17:18   Dg Hip Unilat With Pelvis 1v Right  Result Date: 11/09/2017 CLINICAL DATA:  History of right femoral neck fracture status post ORIF. Patient reports right hip pain. EXAM: DG HIP (WITH OR WITHOUT PELVIS) 1V RIGHT COMPARISON:  06/02/2017 right hip radiographs. 11/07/2017 bilateral lower extremity CT. FINDINGS: Nearly healed fracture of the right femoral neck in anatomic alignment status post transfixation by 3 screws, with no evidence of screw fracture or loosening. No additional fracture. No right hip dislocation. No significant right hip arthropathy. Degenerative changes in the visualized lower lumbar spine. IMPRESSION: Nearly healed right femoral neck fracture in anatomic alignment status post ORIF, with no evidence of hardware complication. Electronically Signed   By: Ilona Sorrel M.D.   On: 11/09/2017 20:12   Scheduled Meds: . amLODipine  10  mg Oral QHS  . carvedilol  50 mg Oral  BID  . Chlorhexidine Gluconate Cloth  6 each Topical Q0600  . heparin injection (subcutaneous)  5,000 Units Subcutaneous Q8H  . hydrALAZINE  25 mg Oral Q12H  . insulin aspart  0-15 Units Subcutaneous TID WC  . insulin aspart  0-5 Units Subcutaneous QHS  . insulin aspart protamine- aspart  15 Units Subcutaneous BID AC  . levothyroxine  137 mcg Oral QAC breakfast  . linezolid  600 mg Oral Q12H  . mouth rinse  15 mL Mouth Rinse BID  . multivitamin with minerals  1 tablet Oral Daily  . mupirocin ointment  1 application Nasal BID  . mycophenolate  500 mg Oral BID  . nystatin   Topical TID  . predniSONE  5 mg Oral Daily  . protein supplement shake  11 oz Oral BID BM  . tacrolimus  0.5 mg Oral QAC breakfast  . tacrolimus  1 mg Oral q1800   Continuous Infusions: . sodium chloride 100 mL/hr at 11/09/17 1657  . ceFEPime (MAXIPIME) IV Stopped (11/09/17 1727)    LOS: 4 days   Kerney Elbe, DO [facility] Pager [phone]  If 7PM-7AM, please contact night-coverage [email or web address] Password [facility] 11/09/2017, 9:45 PM

## 2017-11-09 NOTE — Consult Note (Signed)
Reason for Consult:Possible hip infection Referring Physician: Dr.Smith (Medicine)  [name] is an 52 y.o. female.  HPI: Patient was admitted for cellulitis and generalized swelling. This has been worsening 3-4 days [prior to admission. She does report difficulty ambulating. We were consulted for possible right hip infection. Dr.Norris did a right hip pinning in 2018. She denies any fever or chills. Lower leg pain, but no hip pain.  Normal WBC. Complicated history including DM, H/O kidney transplant/ chonric kidney Dx. Denies CP,SOB, or N/V.  Past Medical History:  Diagnosis Date  . Arthritis    "elbows, knees, legs, back" (09/24/2015)  . CKD (chronic kidney disease)   . Daily headache   . Depression    "years ago"  . End stage renal disease (HCC)    right arm AV graft, resolved post transplant  . Hypertension   . Hypothyroid   . 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 Kidney disease 2011  . Pneumonia ~ 2007?  [name] Type II diabetes mellitus (HCC)    Insulin dependant    Past Surgical History:  Procedure Laterality Date  . AMPUTATION Left 05/11/2014   Procedure: AMPUTATION LEFT GREAT TOE;  Surgeon: Mcarthur Rossetti, MD;  Location: [facility];  Service: Orthopedics;  Laterality: Left;  . AV FISTULA PLACEMENT Right    forearm  . BACK SURGERY    . CATARACT EXTRACTION W/ INTRAOCULAR LENS  IMPLANT, BILATERAL Bilateral   . CESAREAN SECTION  07/1999  . DG AV DIALYSIS GRAFT DECLOT OR    . HIP PINNING,CANNULATED Right 06/02/2017   Procedure: CANNULATED RIGHT HIP PINNING;  Surgeon: Netta Cedars, MD;  Location: [facility];  Service: Orthopedics;  Laterality: Right;  . INCISION AND DRAINAGE Right 06/02/2015   Procedure: INCISION AND DRAINAGE OF RIGHT 3rd RAY RESECTION;  Surgeon: Mcarthur Rossetti, MD;  Location: [place];  Service: Orthopedics;  Laterality: Right;  . 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 [place] [facility]GERY  2001  . NEPHRECTOMY TRANSPLANTED ORGAN     . TOE AMPUTATION Right    1,2 & 3rd toes.  . TUBAL LIGATION  07/1999    Family History  Problem Relation Age of Onset  . Emphysema Mother   . Throat cancer Mother   . COPD Mother   . Cancer Mother   . Emphysema Father   . COPD Father   . Stroke Father   . ADD / ADHD Son     Social History:  reports that she quit smoking about 19 years ago. Her smoking use included cigarettes. She has a 11.00 pack-year smoking history. she has never used smokeless tobacco. She reports that she does not drink alcohol or use drugs.  Allergies:  Allergies  Allergen Reactions  . Latex Itching  . Ace Inhibitors Cough  . Adhesive [Tape] Itching and Other (See Comments)    Please use paper tape  . Lisinopril Cough    Medications: I have reviewed the patient's current medications.  Results for orders placed or performed during the hospital encounter of 11/04/17 (from the past 48 hour(s))  Glucose, capillary     Status: Abnormal   Collection Time: 11/07/17  9:01 PM  Result Value Ref Range   Glucose-Capillary 185 (H) 65 - 99 mg/dL  Renal function panel     Status: Abnormal   Collection Time: 11/08/17  5:34 AM  Result Value Ref Range   Sodium 138 135 - 145 mmol/L   Potassium 4.0 3.5 -  5.1 mmol/L   Chloride 101 101 - 111 mmol/L   CO2 27 22 - 32 mmol/L   Glucose, Bld 271 (H) 65 - 99 mg/dL   BUN 50 (H) 6 - 20 mg/dL   Creatinine, Ser 2.15 (H) 0.44 - 1.00 mg/dL   Calcium 8.6 (L) 8.9 - 10.3 mg/dL   Phosphorus 2.7 2.5 - 4.6 mg/dL   Albumin 1.6 (L) 3.5 - 5.0 g/dL   GFR calc non Af Amer 26 (L) >60 mL/min   GFR calc Af Amer 30 (L) >60 mL/min    Comment: (NOTE) The eGFR has been calculated using the CKD EPI equation. This calculation has not been validated in all clinical situations. eGFR's persistently <60 mL/min signify possible Chronic Kidney Disease.    Anion gap 10 5 - 15    Comment: Performed at [facility], [place] [address] [place], [place] [address]  Magnesium      Status: None   Collection Time: 11/08/17  5:34 AM  Result Value Ref Range   Magnesium 1.9 1.7 - 2.4 mg/dL    Comment: Performed at [facility], [place] [address] [place] [address], [place], [place] [address]  CBC with Differential/Platelet     Status: Abnormal   Collection Time: 11/08/17  5:34 AM  Result Value Ref Range   WBC 6.0 4.0 - 10.5 K/uL   RBC 3.07 (L) 3.87 - 5.11 MIL/uL   Hemoglobin 8.8 (L) 12.0 - 15.0 g/dL   HCT 28.6 (L) 36.0 - 46.0 %   MCV 93.2 78.0 - 100.0 fL   MCH 28.7 26.0 - 34.0 pg   MCHC 30.8 30.0 - 36.0 g/dL   RDW 14.6 11.5 - 15.5 %   Platelets 153 150 - 400 K/uL   Neutrophils Relative % 77 %   Neutro Abs 4.6 1.7 - 7.7 K/uL   Lymphocytes Relative 13 %   Lymphs Abs 0.8 0.7 - 4.0 K/uL   Monocytes Relative 9 %   Monocytes Absolute 0.5 0.1 - 1.0 K/uL   Eosinophils Relative 1 %   Eosinophils Absolute 0.0 0.0 - 0.7 K/uL   Basophils Relative 0 %   Basophils Absolute 0.0 0.0 - 0.1 K/uL    Comment: Performed at [facility], [place] [address], [place], [place] [address]  Comprehensive metabolic panel     Status: Abnormal   Collection Time: 11/08/17  5:34 AM  Result Value Ref Range   Sodium 140 135 - 145 mmol/L   Potassium 4.0 3.5 - 5.1 mmol/L   Chloride 103 101 - 111 mmol/L   CO2 28 22 - 32 mmol/L   Glucose, Bld 276 (H) 65 - 99 mg/dL   BUN 52 (H) 6 - 20 mg/dL   Creatinine, Ser 2.14 (H) 0.44 - 1.00 mg/dL   Calcium 8.7 (L) 8.9 - 10.3 mg/dL   Total Protein 5.2 (L) 6.5 - 8.1 g/dL   Albumin 1.6 (L) 3.5 - 5.0 g/dL   AST 12 (L) 15 - 41 U/L   ALT 9 (L) 14 - 54 U/L   Alkaline Phosphatase 97 38 - 126 U/L   Total Bilirubin 0.9 0.3 - 1.2 mg/dL   GFR calc non Af Amer 26 (L) >60 mL/min   GFR calc Af Amer 30 (L) >60 mL/min    Comment: (NOTE) The eGFR has been calculated using the CKD EPI equation. This calculation has not been validated in all clinical situations. eGFR's persistently <60 mL/min signify possible Chronic Kidney Disease.    Anion gap  9 5 - 15     Comment: Performed at [facility], [place] [address] [place], [place] [address]  Phosphorus     Status: None   Collection Time: 11/08/17  5:34 AM  Result Value Ref Range   Phosphorus 2.7 2.5 - 4.6 mg/dL    Comment: Performed at [facility], [place] [address], [place], [place] [address]  Glucose, capillary     Status: Abnormal   Collection Time: 11/08/17  7:50 AM  Result Value Ref Range   Glucose-Capillary 285 (H) 65 - 99 mg/dL  Glucose, capillary     Status: Abnormal   Collection Time: 11/08/17 11:25 AM  Result Value Ref Range   Glucose-Capillary 199 (H) 65 - 99 mg/dL  Glucose, capillary     Status: Abnormal   Collection Time: 11/08/17  5:34 PM  Result Value Ref Range   Glucose-Capillary 116 (H) 65 - 99 mg/dL  Glucose, capillary     Status: Abnormal   Collection Time: 11/08/17  6:46 PM  Result Value Ref Range   Glucose-Capillary 116 (H) 65 - 99 mg/dL  Glucose, capillary     Status: None   Collection Time: 11/08/17  9:13 PM  Result Value Ref Range   Glucose-Capillary 98 65 - 99 mg/dL  CBC with Differential/Platelet     Status: Abnormal   Collection Time: 11/09/17  5:30 AM  Result Value Ref Range   WBC 6.9 4.0 - 10.5 K/uL   RBC 3.29 (L) 3.87 - 5.11 MIL/uL   Hemoglobin 9.2 (L) 12.0 - 15.0 g/dL   HCT 30.0 (L) 36.0 - 46.0 %   MCV 91.2 78.0 - 100.0 fL   MCH 28.0 26.0 - 34.0 pg   MCHC 30.7 30.0 - 36.0 g/dL   RDW 14.1 11.5 - 15.5 %   Platelets 210 150 - 400 K/uL   Neutrophils Relative % 77 %   Neutro Abs 5.3 1.7 - 7.7 K/uL   Lymphocytes Relative 14 %   Lymphs Abs 1.0 0.7 - 4.0 K/uL   Monocytes Relative 8 %   Monocytes Absolute 0.6 0.1 - 1.0 K/uL   Eosinophils Relative 1 %   Eosinophils Absolute 0.1 0.0 - 0.7 K/uL   Basophils Relative 0 %   Basophils Absolute 0.0 0.0 - 0.1 K/uL    Comment: Performed at [facility], [place] [address] [place], [place] [address]  Comprehensive metabolic panel     Status: Abnormal    Collection Time: 11/09/17  5:30 AM  Result Value Ref Range   Sodium 137 135 - 145 mmol/L   Potassium 3.7 3.5 - 5.1 mmol/L   Chloride 102 101 - 111 mmol/L   CO2 28 22 - 32 mmol/L   Glucose, Bld 73 65 - 99 mg/dL   BUN 45 (H) 6 - 20 mg/dL   Creatinine, Ser 1.93 (H) 0.44 - 1.00 mg/dL   Calcium 8.7 (L) 8.9 - 10.3 mg/dL   Total Protein 5.3 (L) 6.5 - 8.1 g/dL   Albumin 1.7 (L) 3.5 - 5.0 g/dL   AST 13 (L) 15 - 41 U/L   ALT 9 (L) 14 - 54 U/L   Alkaline Phosphatase 90 38 - 126 U/L   Total Bilirubin 0.5 0.3 - 1.2 mg/dL   GFR calc non Af Amer 29 (L) >60 mL/min   GFR calc Af Amer 34 (L) >60 mL/min    Comment: (NOTE) The eGFR has been calculated using the CKD EPI equation. This calculation has not  been validated in all clinical situations. eGFR's persistently <60 mL/min signify possible Chronic Kidney Disease.    Anion gap 7 5 - 15    Comment: Performed at [facility], [place] [address] [place], [place] [address]  Magnesium     Status: None   Collection Time: 11/09/17  5:30 AM  Result Value Ref Range   Magnesium 1.8 1.7 - 2.4 mg/dL    Comment: Performed at [facility], [place] [address] [place], [place] [address]  Phosphorus     Status: Abnormal   Collection Time: 11/09/17  5:30 AM  Result Value Ref Range   Phosphorus 2.1 (L) 2.5 - 4.6 mg/dL    Comment: Performed at [facility], [place] [address] [place], [place] [address]  Glucose, capillary     Status: None   Collection Time: 11/09/17  8:10 AM  Result Value Ref Range   Glucose-Capillary 79 65 - 99 mg/dL  Glucose, capillary     Status: Abnormal   Collection Time: 11/09/17 11:58 AM  Result Value Ref Range   Glucose-Capillary 64 (L) 65 - 99 mg/dL  Glucose, capillary     Status: Abnormal   Collection Time: 11/09/17  4:41 PM  Result Value Ref Range   Glucose-Capillary 105 (H) 65 - 99 mg/dL    Mr Hip Right Wo Contrast  Result Date: 11/08/2017 CLINICAL DATA:  Bilateral lower  extremity pain, swelling and erythema for several days. Patient reports chronic lower extremity edema. History of chronic kidney disease, diabetes, renal transplant and hypertension. EXAM: MR OF THE RIGHT HIP WITHOUT CONTRAST TECHNIQUE: Multiplanar, multisequence MR imaging was performed. No intravenous contrast was administered. COMPARISON:  Lower extremity CT 11/07/2017.  Pelvic CT 11/05/2017. FINDINGS: Bones: Status post fixation of the right femoral neck with 3 cortical screws. The hardware is in stable position. No displaced fracture. There is ill-defined low T1 and high T2 signal in the left superior pubic ramus, best seen on coronal images 17 and 18, likely a nondisplaced fracture. The inferior pubic ramus appears intact. The sacrum and sacroiliac joints appear intact. There are mild degenerative changes of both hips with small bilateral hip joint effusions. No evidence of femoral head avascular necrosis. Articular cartilage and labrum Articular cartilage:  No focal chondral defect. Labrum:  No gross labral tear or paralabral cyst. Joint or bursal effusion Joint effusion:  Small bilateral hip joint effusions. Bursae: No focal bursal fluid collection. Muscles and tendons Muscles and tendons: Generalized edema throughout the visualized pelvic and proximal thigh musculature. No focal intramuscular fluid collection or acute tendon findings identified. Other findings Miscellaneous: As on previous CT, generalized anasarca with diffuse edema throughout the subcutaneous and pelvic fat. Small amount of pelvic ascites. Transplant kidney in the right iliac fossa appears unchanged. IMPRESSION: 1. Nonspecific generalized soft tissue edema involving the musculature and subcutaneous and deep fat, consistent with nonspecific anasarca. Ascites without focal fluid collection. 2. Previous right hip pinning without displaced fracture. Marrow edema in the left superior pubic ramus suspicious for a nondisplaced fracture. The  pelvis otherwise appears intact. Electronically Signed   By: Richardean Sale M.D.   On: 11/08/2017 17:18    Review of Systems  Constitutional: Negative for chills and fever.  HENT: Negative.   Eyes: Negative.   Respiratory: Negative.   Cardiovascular: Negative for chest pain.  Gastrointestinal: Negative.   Genitourinary: Negative.   Musculoskeletal: Positive for joint pain.  Skin: Positive for rash.  Neurological: Positive for weakness.  Endo/Heme/Allergies: Negative.  Psychiatric/Behavioral: Negative.    Blood pressure 130/65, pulse 75, temperature 98.3 F (36.8 C), temperature source Oral, resp. rate 18, height 5' 7" (1.702 m), weight 84.3 kg (185 lb 13.6 oz), last menstrual period 11/19/2011, SpO2 94 %. Physical Exam  Constitutional: She is oriented to person, place, and time. She appears well-developed.  HENT:  Head: Normocephalic.  Eyes: EOM are normal.  Neck: Normal range of motion.  Cardiovascular: Normal rate and intact distal pulses.  Respiratory: Effort normal.  GI: Soft.  Genitourinary:  Genitourinary Comments: Deferred  Musculoskeletal:  Bilateral lower leg erythema and discoloration. Plantar and dorsi flexion. No hip pain with ROM. Slight erythema of right troch bursa region consist with pressure. No signs of hip infection. 2+ pedal pulse. Previous toe amputation.   Neurological: She is alert and oriented to person, place, and time.  Skin: Skin is warm and dry.  Psychiatric: Her behavior is normal.    Assessment/Plan: Anasarca: No sign of right hip infection on imaging or clinically No surgical treatment indicated of her hip at this time Continue Medical management Continue ABX On Heparin SQ Discussed with Dr.Swinteck and he will follow up. Admitted to medicine Nephrology and ID following   Wyatt Portela L 11/09/2017, 6:23 PM

## 2017-11-10 LAB — CBC WITH DIFFERENTIAL/PLATELET
Basophils Absolute: 0 10*3/uL (ref 0.0–0.1)
Basophils Relative: 0 %
Eosinophils Absolute: 0 10*3/uL (ref 0.0–0.7)
Eosinophils Relative: 0 %
HCT: 29.5 % — ABNORMAL LOW (ref 36.0–46.0)
Hemoglobin: 8.9 g/dL — ABNORMAL LOW (ref 12.0–15.0)
LYMPHS ABS: 0.9 10*3/uL (ref 0.7–4.0)
LYMPHS PCT: 11 %
MCH: 28.6 pg (ref 26.0–34.0)
MCHC: 30.2 g/dL (ref 30.0–36.0)
MCV: 94.9 fL (ref 78.0–100.0)
MONO ABS: 0.6 10*3/uL (ref 0.1–1.0)
MONOS PCT: 7 %
Neutro Abs: 6.3 10*3/uL (ref 1.7–7.7)
Neutrophils Relative %: 82 %
PLATELETS: 246 10*3/uL (ref 150–400)
RBC: 3.11 MIL/uL — AB (ref 3.87–5.11)
RDW: 14.5 % (ref 11.5–15.5)
WBC: 7.8 10*3/uL (ref 4.0–10.5)

## 2017-11-10 LAB — GLUCOSE, CAPILLARY
Glucose-Capillary: 240 mg/dL — ABNORMAL HIGH (ref 65–99)
Glucose-Capillary: 245 mg/dL — ABNORMAL HIGH (ref 65–99)
Glucose-Capillary: 139 mg/dL — ABNORMAL HIGH (ref 65–99)
Glucose-Capillary: 102 mg/dL — ABNORMAL HIGH (ref 65–99)

## 2017-11-10 LAB — COMPREHENSIVE METABOLIC PANEL
ALT: 9 U/L — AB (ref 14–54)
AST: 11 U/L — ABNORMAL LOW (ref 15–41)
Albumin: 1.6 g/dL — ABNORMAL LOW (ref 3.5–5.0)
Alkaline Phosphatase: 98 U/L (ref 38–126)
Anion gap: 11 (ref 5–15)
BUN: 48 mg/dL — ABNORMAL HIGH (ref 6–20)
CHLORIDE: 101 mmol/L (ref 101–111)
CO2: 22 mmol/L (ref 22–32)
Calcium: 8.5 mg/dL — ABNORMAL LOW (ref 8.9–10.3)
Creatinine, Ser: 2.1 mg/dL — ABNORMAL HIGH (ref 0.44–1.00)
GFR, EST AFRICAN AMERICAN: 31 mL/min — AB (ref >60)
GFR, EST NON AFRICAN AMERICAN: 26 mL/min — AB (ref >60)
Glucose, Bld: 239 mg/dL — ABNORMAL HIGH (ref 65–99)
Potassium: 4.8 mmol/L (ref 3.5–5.1)
Sodium: 134 mmol/L — ABNORMAL LOW (ref 135–145)
Total Bilirubin: 0.9 mg/dL (ref 0.3–1.2)
Total Protein: 5.2 g/dL — ABNORMAL LOW (ref 6.5–8.1)

## 2017-11-10 LAB — MAGNESIUM: MAGNESIUM: 1.9 mg/dL (ref 1.7–2.4)

## 2017-11-10 LAB — PHOSPHORUS: PHOSPHORUS: 3.1 mg/dL (ref 2.5–4.6)

## 2017-11-10 MED ORDER — LIP MEDEX EX OINT
TOPICAL_OINTMENT | CUTANEOUS | Status: DC | PRN
  Filled 2017-11-10 (×2): qty 7

## 2017-11-10 MED ORDER — SODIUM BICARBONATE 650 MG PO TABS
650.0000 mg | ORAL_TABLET | Freq: Every day | ORAL | Status: DC
  Administered 2017-11-10 – 2017-11-11 (×2): 650 mg via ORAL
  Filled 2017-11-10 (×2): qty 1

## 2017-11-10 NOTE — Progress Notes (Addendum)
Nutrition Follow-up  DOCUMENTATION CODES:   Not applicable  INTERVENTION:   Continue Premier Protein BID  RD to switch diet to DYS 2 DIET  Continue MVI  Encouraged PO intake of ONS between meals    NUTRITION DIAGNOSIS:   Biting/chewing difficulty related to other (see comment)(poor dentition) as evidenced by per patient/family report.  Ongoing  GOAL:   Patient will meet greater than or equal to 90% of their needs  Not met  MONITOR:   PO intake, Supplement acceptance, Weight trends, Labs   ASSESSMENT:   Pt with PMH significant for HTN, s/p renal transplant on chronic immunosuppressive therapy, DM type I, and depression. Presents this admission with complaints of bilateral lower extremity pain and swelling over the last several days. Admitted for Lower extremity cellulitis and ARF on CKD.   Spoke with pt at time when her meal tray was recently delivered. Meal was not consumed at all. Pt reports she is not eating much of the meals on tray. She has been working on them and is hungry, but is only consuming ~30-50% because food hurts her gums when she tries to chew; she also states that it takes her a long time to eat due to chewing issues and eventually she gives up because it is a lot of work.   Pt also reports decreased intake since she is on HH/Carb mod that is too bland for her taste. Pt reports that she likes Mrs. Deliah Boston; procured Mrs Deliah Boston for her. RD will switch diet to DYS 2.  Denies any other issues with PO intake. Pt denies any digestive issues. Pt reports that she is consuming Premier Protein, but both ONS drinks were on her tray unopened. Discussed with pt that it is important to consume ONS especially if her PO intake is poor in order to get adequate nutrition. She agreed to make an effort to drink ONS between meals.  Labs reviewed:  CBG's 117, 240, 245 Na 134 L BUN 48 H Cr 2.1 H  Medications:  Insulin Synthroid Prednisone Sodium  Bicarb Prograff Continuous/IV-- NaCl @ 100/hr   Diet Order:  DIET DYS 2 Room service appropriate? Yes; Fluid consistency: Thin  EDUCATION NEEDS:   Not appropriate for education at this time  Skin:  Skin Assessment: Reviewed RN Assessment  Last BM:  3/6; BM x 3 today per pt, soft   Height:   Ht Readings from Last 1 Encounters:  11/05/17 _0  (1.702 m)    Weight:   Wt Readings from Last 1 Encounters:  11/06/17 185 lb 13.6 oz (84.3 kg)    Ideal Body Weight:  61.4 kg  BMI:  Body mass index is 29.11 kg/m.  Estimated Nutritional Needs:   Kcal:  1600-1800 kcal/day  Protein:  80-90 g/day  Fluid:  >1.6 L/day    Edmonia Lynch, MS Dietetic Intern Pager: [phone]

## 2017-11-10 NOTE — Evaluation (Signed)
Physical Therapy Evaluation Patient Details Name: [name] MRN: [id number] DOB: 01-01-1966 Today's Date: 11/10/2017   History of Present Illness  52 yo female admitted with bil LE cellulitis, possible L pubic ramus fx?[name] Hx of R hip pinning 05/2017, HTN, CKD, DM, DVT, chronic LE edema, renal transplant, great toe amputation.     Clinical Impression  On eval, pt required Mod assist for mobility. She was able to take a few side steps along the side of the bed with a RW. She was unable to tolerate any ambulation on today. Pt c/o weakness, dizziness, fatigue, and pain in bil LEs, but especially L calf. At this time, recommend ST rehab at SNF if pt is agreeable. If pt progresses with mobility, she may be able to d/c home with HHPT f/u. Will continue to follow and progress activity as tolerated.     Follow Up Recommendations SNF(unless mobility significantly improves)    Equipment Recommendations  None recommended by PT    Recommendations for Other Services       Precautions / Restrictions Precautions Precautions: Fall Restrictions Weight Bearing Restrictions: No      Mobility  Bed Mobility Overal bed mobility: Needs Assistance Bed Mobility: Supine to Sit;Sit to Supine     Supine to sit: Min assist;HOB elevated Sit to supine: Min assist;HOB elevated   General bed mobility comments: Assist for LEs. Increased time.   Transfers Overall transfer level: Needs assistance Equipment used: Rolling walker (2 wheeled) Transfers: Sit to/from Stand Sit to Stand: Mod assist;From elevated surface         General transfer comment: Assist to rise, stabilize, control descent. VCS safety, technique, hand placement, posture. Highly elevated bed surface  Ambulation/Gait Ambulation/Gait assistance: Min assist   Assistive device: Rolling walker (2 wheeled)       General Gait Details: side steps along side of bed with RW. Cues for posture. Increased time. Pt unable to attempt  ambulation on today due to pain, fatigue, weakness.  Stairs            Wheelchair Mobility    Modified Rankin (Stroke Patients Only)       Balance Overall balance assessment: Needs assistance         Standing balance support: Bilateral upper extremity supported Standing balance-Leahy Scale: Poor Standing balance comment: needs RW                             Pertinent Vitals/Pain Pain Assessment: Faces Faces Pain Scale: Hurts even more Pain Location: R hip, L calf Pain Descriptors / Indicators: Sharp;Discomfort;Sore Pain Intervention(s): Limited activity within patient's tolerance;Repositioned    Home Living Family/patient expects to be discharged to:: Private residence Living Arrangements: Spouse/significant other Available Help at Discharge: Family;Available PRN/intermittently Type of Home: Mobile home Home Access: Stairs to enter Entrance Stairs-Rails: Right;Left;Can reach both Entrance Stairs-Number of Steps: 8 in the front, 2 in the back Home Layout: One level Home Equipment: Shower seat;Cane - single point;Walker - 4 wheels;Bedside commode      Prior Function Level of Independence: Independent with assistive device(s)         Comments: uses assistive devices as needed     Hand Dominance        Extremity/Trunk Assessment   Upper Extremity Assessment Upper Extremity Assessment: Generalized weakness    Lower Extremity Assessment Lower Extremity Assessment: Generalized weakness    Cervical / Trunk Assessment Cervical / Trunk Assessment: Normal  Communication  Communication: No difficulties  Cognition Arousal/Alertness: Awake/alert Behavior During Therapy: WFL for tasks assessed/performed Overall Cognitive Status: Within Functional Limits for tasks assessed                                        General Comments      Exercises     Assessment/Plan    PT Assessment Patient needs continued PT services   PT Problem List Decreased strength;Decreased balance;Decreased mobility;Decreased activity tolerance;Pain;Decreased knowledge of use of DME       PT Treatment Interventions DME instruction;Gait training;Functional mobility training;Therapeutic activities;Balance training;Patient/family education;Therapeutic exercise    PT Goals (Current goals can be found in the Care Plan section)  Acute Rehab PT Goals Patient Stated Goal: less pain PT Goal Formulation: With patient Time For Goal Achievement: 11/24/17 Potential to Achieve Goals: Good    Frequency Min 3X/week   Barriers to discharge        Co-evaluation               AM-PAC PT "6 Clicks" Daily Activity  Outcome Measure Difficulty turning over in bed (including adjusting bedclothes, sheets and blankets)?: A Lot Difficulty moving from lying on back to sitting on the side of the bed? : Unable Difficulty sitting down on and standing up from a chair with arms (e.g., wheelchair, bedside commode, etc,.)?: Unable Help needed moving to and from a bed to chair (including a wheelchair)?: A Lot Help needed walking in hospital room?: A Lot Help needed climbing 3-5 steps with a railing? : A Lot 6 Click Score: 10    End of Session Equipment Utilized During Treatment: Gait belt Activity Tolerance: Patient limited by fatigue;Patient limited by pain Patient left: in bed;with call bell/phone within reach   PT Visit Diagnosis: Muscle weakness (generalized) (M62.81);Difficulty in walking, not elsewhere classified (R26.2)    Time: [id number] PT Time Calculation (min) (ACUTE ONLY): 30 min   Charges:   PT Evaluation $PT Eval Moderate Complexity: 1 Mod PT Treatments $Therapeutic Activity: 8-22 mins   PT G Codes:          Rebeca AlertJannie Faithann Natal, MPT Pager: [phone]

## 2017-11-10 NOTE — Progress Notes (Signed)
PROGRESS NOTE  [name]:[id number] DOB: 01-Aug-1966 DOA: 11/04/2017 PCP: Dois Davenport, MD  HPI/Recap of past 24 hours:  Feeling weak complaining of leg pain  Assessment/Plan: Principal Problem:   Localized swelling of lower extremity Active Problems:   Immunosuppression (HCC)   Hypothyroidism   H/O kidney transplant   Essential hypertension   Uncontrolled type 1 diabetes mellitus with foot ulcer (HCC)   Anemia of chronic disease  Bilateral lower extremity with cellulitis She also reports right hip erythema and warmth, she has history of hardware.  Orthopedic consulted recommended MRI of the hip which did not show septic arthritis.  Although her fracture has not completely healed, also recommended exchange of her school once infection treated Venous Doppler no DVT Blood culture negative Is currently on IV cefepime and oral Zyvox per infectious disease recommendation  Acute renal failure on chronic kidney disease Stage 3, s/prenal transplant on chronic immunosuppressive therapy Nephrology consulted, put appreciated  Metabolic acidosis Continue bicarb supplement  Insulin-dependent diabetes She is also on chronic steroid Adjust insulin as needed  Chronic hypoxic respiratory failure on home oxygen Stable, chest x-ray no acute findings  Failure to thrive Skilled nursing facility placement  Code Status: full  Family Communication: patient   Disposition Plan: SNF placement once medically stable   Consultants:  Infectious disease  Orthopedic  Nephrology  Procedures:  None  Antibiotics:  As above   Objective: BP 109/65 (BP Location: Left Arm)   Pulse 75   Temp 98 F (36.7 C) (Oral)   Resp 17   Ht 5\' 7"  (1.702 m)   Wt 84.3 kg (185 lb 13.6 oz)   LMP 11/19/2011   SpO2 92%   BMI 29.11 kg/m   Intake/Output Summary (Last 24 hours) at 11/10/2017 1241 Last data filed at 11/10/2017 1047 Gross per 24 hour  Intake 3098.33 ml  Output 850 ml    Net 2248.33 ml   Filed Weights   11/05/17 0235 11/06/17 0504  Weight: 82.4 kg (181 lb 9.6 oz) 84.3 kg (185 lb 13.6 oz)    Exam: Patient is examined daily including today on 11/10/2017, exams remain the same as of yesterday except that has changed    General: Chronically ill-appearing ,NAD  Cardiovascular: RRR  Respiratory: CTABL  Abdomen: Soft/ND/NT, positive BS  Musculoskeletal: Lower extremity edema is improving skin start to wrinkle , chronic venous stasis changes   Neuro: alert, oriented   Data Reviewed: Basic Metabolic Panel: Recent Labs  Lab 11/06/17 0515 11/07/17 0531 11/08/17 0534 11/09/17 0530 11/10/17 0528  NA 140 137  138 138  140 137 134*  K 3.9 3.4*  3.4* 4.0  4.0 3.7 4.8  CL 106 100*  100* 101  103 102 101  CO2 24 27  28 27  28 28 22   GLUCOSE 120* 151*  152* 271*  276* 73 239*  BUN 58* 51*  52* 50*  52* 45* 48*  CREATININE 2.42* 2.14*  2.12* 2.15*  2.14* 1.93* 2.10*  CALCIUM 8.6* 8.3*  8.3* 8.6*  8.7* 8.7* 8.5*  MG 1.9 1.8 1.9 1.8 1.9  PHOS 3.8 2.5  2.6 2.7  2.7 2.1* 3.1   Liver Function Tests: Recent Labs  Lab 11/06/17 0515 11/07/17 0531 11/08/17 0534 11/09/17 0530 11/10/17 0528  AST 12* 13* 12* 13* 11*  ALT 9* 8* 9* 9* 9*  ALKPHOS 79 83 97 90 98  BILITOT 0.5 0.5 0.9 0.5 0.9  PROT 5.0* 5.0* 5.2* 5.3* 5.2*  ALBUMIN 1.7*  1.6*  1.5* 1.6*  1.6* 1.7* 1.6*   No results for input(s): LIPASE, AMYLASE in the last 168 hours. No results for input(s): AMMONIA in the last 168 hours. CBC: Recent Labs  Lab 11/06/17 0515 11/07/17 0531 11/07/17 1603 11/08/17 0534 11/09/17 0530 11/10/17 0528  WBC 7.7 7.4  --  6.0 6.9 7.8  NEUTROABS 6.6 6.1  --  4.6 5.3 6.3  HGB 9.1* 9.0*  --  8.8* 9.2* 8.9*  HCT 29.2* 28.8*  --  28.6* 30.0* 29.5*  MCV 91.8 91.1  --  93.2 91.2 94.9  PLT 128* 125* 159 153 210 246   Cardiac Enzymes:   No results for input(s): CKTOTAL, CKMB, CKMBINDEX, TROPONINI in the last 168 hours. BNP (last 3  results) Recent Labs    04/06/17 1941 05/09/17 0853 08/31/17 1535  BNP 137.1* 282.1* 1,022.8*    ProBNP (last 3 results) No results for input(s): PROBNP in the last 8760 hours.  CBG: Recent Labs  Lab 11/09/17 1158 11/09/17 1641 11/09/17 2105 11/10/17 0805 11/10/17 1212  GLUCAP 64* 105* 117* 240* 245*    Recent Results (from the past 240 hour(s))  MRSA PCR Screening     Status: Abnormal   Collection Time: 11/05/17  3:19 AM  Result Value Ref Range Status   MRSA by PCR POSITIVE (A) NEGATIVE Final    Comment:        The GeneXpert MRSA Assay (FDA approved for NASAL specimens only), is one component of a comprehensive MRSA colonization surveillance program. It is not intended to diagnose MRSA infection nor to guide or monitor treatment for MRSA infections. RESULT CALLED TO, READ BACK BY AND VERIFIED WITH: HOLT,B @ 1514 ON [id number] BY POTEAT,S Performed at [facility], [address], [place], [place] [address]   Culture, blood (Routine X 2) w Reflex to ID Panel     Status: None (Preliminary result)   Collection Time: 11/07/17  2:59 PM  Result Value Ref Range Status   Specimen Description   Final    LEFT ANTECUBITAL Performed at [facility], [address], [place], [place] [address]    Special Requests   Final    IN PEDIATRIC BOTTLE Blood Culture adequate volume Performed at [facility], [address] [place], [place] [address]    Culture   Final    NO GROWTH 1 DAY Performed at [facility], [address], [place], [place] [address]    Report Status PENDING  Incomplete  Culture, blood (single)     Status: None (Preliminary result)   Collection Time: 11/07/17  3:12 PM  Result Value Ref Range Status   Specimen Description   Final    BLOOD LEFT HAND Performed at [facility], [address], [place], [place] [address]    Special Requests   Final    BOTTLES DRAWN AEROBIC ONLY  Blood Culture adequate volume Performed at [facility], [address], [place], [place] [address]    Culture   Final    NO GROWTH 1 DAY Performed at [facility], [address], [place], [place] [address]    Report Status PENDING  Incomplete     Studies: Dg Hip Unilat With Pelvis 1v Right  Result Date: 11/09/2017 CLINICAL DATA:  History of right femoral neck fracture status post ORIF. Patient reports right hip pain. EXAM: DG HIP (WITH OR WITHOUT PELVIS) 1V RIGHT COMPARISON:  06/02/2017 right hip radiographs. 11/07/2017  bilateral lower extremity CT. FINDINGS: Nearly healed fracture of the right femoral neck in anatomic alignment status post transfixation by 3 screws, with no evidence of screw fracture or loosening. No additional fracture. No right hip dislocation. No significant right hip arthropathy. Degenerative changes in the visualized lower lumbar spine. IMPRESSION: Nearly healed right femoral neck fracture in anatomic alignment status post ORIF, with no evidence of hardware complication. Electronically Signed   By: Jason A Poff M.D.   On: 11/09/2017 20:12    Scheduled Delbert PhenixMeds: . amLODipine  10 mg Oral QHS  . carvedilol  50 mg Oral BID  . heparin injection (subcutaneous)  5,000 Units Subcutaneous Q8H  . hydrALAZINE  25 mg Oral Q12H  . insulin aspart  0-15 Units Subcutaneous TID WC  . insulin aspart  0-5 Units Subcutaneous QHS  . insulin aspart protamine- aspart  15 Units Subcutaneous BID AC  . levothyroxine  137 mcg Oral QAC breakfast  . linezolid  600 mg Oral Q12H  . mouth rinse  15 mL Mouth Rinse BID  . multivitamin with minerals  1 tablet Oral Daily  . mycophenolate  500 mg Oral BID  . nystatin   Topical TID  . predniSONE  5 mg Oral Daily  . protein supplement shake  11 oz Oral BID BM  . sodium bicarbonate  650 mg Oral Daily  . tacrolimus  0.5 mg Oral QAC breakfast  . tacrolimus  1 mg Oral q1800    Continuous Infusions: . sodium chloride 100 mL/hr  at 11/10/17 1047  . ceFEPime (MAXIPIME) IV Stopped (11/09/17 1727)     Time spent: 25mins I have personally reviewed and interpreted on  11/10/2017 daily labs, tele strips, imagings as discussed above under date review session and assessment and plans.  I reviewed all nursing notes, pharmacy notes, consultant notes,  vitals, pertinent old records  I have discussed plan of care as described above with RN , patient  on 11/10/2017   Albertine GratesFang Keshara Kiger MD, PhD  [facility] Pager [phone]. If 7PM-7AM, please contact night-coverage at [email or web address], password [email or web address] 11/10/2017, 12:41 PM  LOS: 5 days

## 2017-11-10 NOTE — Evaluation (Signed)
Occupational Therapy Evaluation Patient Details Name: [name] MRN: [id number] DOB: 1965-11-10 Today's Date: 11/10/2017    History of Present Illness 52 yo female admitted with bil LE cellulitis, possible L pubic ramus fx?[name]. Hx of R hip pinning 05/2017, HTN, CKD, DM, DVT, chronic LE edema, renal transplant, great toe amputation.    Clinical Impression   This 52 y/o F presents with the above. At baseline pt reports is independent with ADLs and functional mobility. Pt currently requiring MaxA for stand pivot transfers using RW, toileting and LB ADLs, limited mostly by pain, nausea, and generalized weakness. Pt will benefit from continued acute OT services, and based upon pt's current level of assist recommend SNF level services after discharge to maximize her overall safety and independence with ADLs and mobility prior to return home.     Follow Up Recommendations  SNF;Supervision/Assistance - 24 hour    Equipment Recommendations  Other (comment)(TBD in next venue )           Precautions / Restrictions Precautions Precautions: Fall Restrictions Weight Bearing Restrictions: No      Mobility Bed Mobility Overal bed mobility: Needs Assistance Bed Mobility: Supine to Sit;Sit to Supine     Supine to sit: HOB elevated;Min guard Sit to supine: Mod assist   General bed mobility comments: mingurd for safety to come to sitting with increased time/effort; assist for bil LEs when returning to supine   Transfers Overall transfer level: Needs assistance Equipment used: Rolling walker (2 wheeled) Transfers: Sit to/from UGI CorporationStand;Stand Pivot Transfers Sit to Stand: Mod assist;From elevated surface;Max assist Stand pivot transfers: Mod assist;Max assist       General transfer comment: Assist to rise, stabilize from elevated bed height with increased assist required to stand from lower surface height of BSC; VCS safety, technique, hand placement, posture. steadying assist and verbal cues  for safe transfer technique to pivot to [facility]; pt requires increased assist to control descent when transferring back to EOB as pt "plopping" into sitting EOB prior to fully turning and backing up to bedside     Balance Overall balance assessment: Needs assistance   Sitting balance-Leahy Scale: Fair     Standing balance support: Bilateral upper extremity supported Standing balance-Leahy Scale: Poor Standing balance comment: needs RW and external support                            ADL either performed or assessed with clinical judgement   ADL Overall ADL's : Needs assistance/impaired Eating/Feeding: Set up;Sitting   Grooming: Set up;Sitting   Upper Body Bathing: Min guard;Sitting   Lower Body Bathing: Moderate assistance;Sit to/from stand   Upper Body Dressing : Minimal assistance;Sitting   Lower Body Dressing: Maximal assistance;Sit to/from stand Lower Body Dressing Details (indicate cue type and reason): total assist to don socks sitting EOB  Toilet Transfer: Moderate assistance;Stand-pivot;BSC;RW;Maximal assistance   Toileting- Clothing Manipulation and Hygiene: Sit to/from stand;Maximal assistance Toileting - Clothing Manipulation Details (indicate cue type and reason): assist for gown management and peri-care after BM in standing      Functional mobility during ADLs: Moderate assistance;Maximal assistance;Rolling walker General ADL Comments: pt on bed pan start of session; completed stand pivot to [facility] to attempt having BM; pt with dizziness upon sitting EOB, BP taken and 116/51; pt with increased unsteadiness and requires mod-maxA for all mobility, when returning to EOB after toileting Pt "plopping" into sitting vs turning fully to back up to EOB  Pertinent Vitals/Pain Pain Assessment: Faces Faces Pain Scale: Hurts little more Pain Location: abdomen Pain Descriptors / Indicators: Sharp;Discomfort;Sore Pain Intervention(s):  Limited activity within patient's tolerance;Monitored during session;Repositioned     Hand Dominance Right   Extremity/Trunk Assessment Upper Extremity Assessment Upper Extremity Assessment: Generalized weakness   Lower Extremity Assessment Lower Extremity Assessment: Defer to PT evaluation   Cervical / Trunk Assessment Cervical / Trunk Assessment: Normal   Communication Communication Communication: No difficulties   Cognition Arousal/Alertness: Awake/alert Behavior During Therapy: WFL for tasks assessed/performed Overall Cognitive Status: Within Functional Limits for tasks assessed                                                      Home Living Family/patient expects to be discharged to:: Private residence Living Arrangements: Spouse/significant other Available Help at Discharge: Family;Available PRN/intermittently Type of Home: Mobile home Home Access: Stairs to enter Entrance Stairs-Number of Steps: 8 in the front, 2 in the back Entrance Stairs-Rails: Right;Left;Can reach both Home Layout: One level     Bathroom Shower/Tub: Chief Strategy Officer: Standard     Home Equipment: Shower seat;Cane - single point;Walker - 4 wheels;Bedside commode          Prior Functioning/Environment Level of Independence: Independent with assistive device(s)        Comments: uses assistive devices as needed        OT Problem List: Decreased strength;Decreased activity tolerance;Impaired balance (sitting and/or standing)      OT Treatment/Interventions: Self-care/ADL training;DME and/or AE instruction;Therapeutic activities;Balance training;Therapeutic exercise;Energy conservation;Patient/family education    OT Goals(Current goals can be found in the care plan section) Acute Rehab OT Goals Patient Stated Goal: less pain OT Goal Formulation: With patient Time For Goal Achievement: 11/24/17 Potential to Achieve Goals: Good  OT Frequency:  Min 2X/week                             AM-PAC PT "6 Clicks" Daily Activity     Outcome Measure Help from another person eating meals?: None Help from another person taking care of personal grooming?: A Little Help from another person toileting, which includes using toliet, bedpan, or urinal?: A Lot Help from another person bathing (including washing, rinsing, drying)?: A Lot Help from another person to put on and taking off regular upper body clothing?: A Little Help from another person to put on and taking off regular lower body clothing?: A Lot 6 Click Score: 16   End of Session Equipment Utilized During Treatment: Gait belt;Rolling walker;Oxygen Nurse Communication: Mobility status  Activity Tolerance: Patient tolerated treatment well Patient left: in bed;with call bell/phone within reach;with bed alarm set  OT Visit Diagnosis: Muscle weakness (generalized) (M62.81);Other abnormalities of gait and mobility (R26.89)                Time: 5409-8119 OT Time Calculation (min): 38 min Charges:  OT General Charges $OT Visit: 1 Visit OT Evaluation $OT Eval Moderate Complexity: 1 Mod OT Treatments $Self Care/Home Management : 8-22 mins G-Codes:     Marcy Siren, OT Pager [phone] 11/10/2017   Orlando Penner 11/10/2017, 3:03 PM

## 2017-11-10 NOTE — Progress Notes (Signed)
Pharmacy Antibiotic Note  [name] is a 52 y.o. female admitted on 11/04/2017 with cellulitis.  She was started on Rocephin which was broadened to Cefepime + Zyvox.  Pharmacy has been consulted for cefepime dosing. Patient has hx kidney transplant.   11/10/2017:  Total abx Day#6  Afebrile  Renal function stable since admission, est CrCl ~2335ml/min  Cx data negative to date  CBC stable  Plan: Continue Cefepime 2g IV q24h Zyvox per MD Monitor renal function and cx data   Height: 5\' 7"  (170.2 cm) Weight: 185 lb 13.6 oz (84.3 kg) IBW/kg (Calculated) : 61.6  Temp (24hrs), Avg:98 F (36.7 C), Min:97.8 F (36.6 C), Max:98.3 F (36.8 C)  Recent Labs  Lab 11/04/17 1414  11/06/17 0515 11/07/17 0531 11/08/17 0534 11/09/17 0530 11/10/17 0528  WBC  --    < > 7.7 7.4 6.0 6.9 7.8  CREATININE  --    < > 2.42* 2.14*  2.12* 2.15*  2.14* 1.93* 2.10*  LATICACIDVEN 1.53  --   --   --   --   --   --    < > = values in this interval not displayed.    Estimated Creatinine Clearance: 35.8 mL/min (A) (by C-G formula based on SCr of 2.1 mg/dL (H)).    Allergies  Allergen Reactions  . Latex Itching  . Ace Inhibitors Cough  . Adhesive [Tape] Itching and Other (See Comments)    Please use paper tape  . Lisinopril Cough   Antimicrobials this admission: 3/3 cefepime >> 3/4 Zyvox >> 2/28 CTX>>3/3  Dose adjustments this admission:  Microbiology results: 3/3 BCx: NGTD 3/1 MRSA PCR: positive  Thank you for allowing pharmacy to be a part of this patient's care.  Junita PushMichelle Bailey Kolbe, PharmD, BCPS Pager: [phone] 11/10/2017 8:15 AM

## 2017-11-10 NOTE — Progress Notes (Signed)
Foley catheter removed this morning and external catheter was placed. When OT worked with patient, it was reported that patient got up to bedside commode but no report if there was any urine output. After therapy external catheter was not replaced by OT. RN and NT not aware that catheter was not on. At end of shift external catheter was replaced and patient's bed pads were dry. Bladder scan sowed 80cc in bladder. Will pass on to night RN to monitor.

## 2017-11-11 ENCOUNTER — Encounter (HOSPITAL_COMMUNITY): Payer: Self-pay

## 2017-11-11 ENCOUNTER — Inpatient Hospital Stay (HOSPITAL_COMMUNITY): Payer: Medicare Other

## 2017-11-11 LAB — BASIC METABOLIC PANEL
Anion gap: 9 (ref 5–15)
BUN: 49 mg/dL — AB (ref 6–20)
CALCIUM: 8.6 mg/dL — AB (ref 8.9–10.3)
CO2: 22 mmol/L (ref 22–32)
CREATININE: 2.32 mg/dL — AB (ref 0.44–1.00)
Chloride: 101 mmol/L (ref 101–111)
GFR, EST AFRICAN AMERICAN: 27 mL/min — AB (ref >60)
GFR, EST NON AFRICAN AMERICAN: 23 mL/min — AB (ref >60)
Glucose, Bld: 26 mg/dL — CL (ref 65–99)
Potassium: 4.5 mmol/L (ref 3.5–5.1)
SODIUM: 132 mmol/L — AB (ref 135–145)

## 2017-11-11 LAB — HEMOGLOBIN A1C
HEMOGLOBIN A1C: 8.9 % — AB (ref 4.8–5.6)
MEAN PLASMA GLUCOSE: 208.73 mg/dL

## 2017-11-11 LAB — GLUCOSE, CAPILLARY
Glucose-Capillary: 112 mg/dL — ABNORMAL HIGH (ref 65–99)
Glucose-Capillary: 141 mg/dL — ABNORMAL HIGH (ref 65–99)
Glucose-Capillary: 206 mg/dL — ABNORMAL HIGH (ref 65–99)
Glucose-Capillary: 210 mg/dL — ABNORMAL HIGH (ref 65–99)

## 2017-11-11 MED ORDER — CARVEDILOL 3.125 MG PO TABS
3.1250 mg | ORAL_TABLET | Freq: Two times a day (BID) | ORAL | Status: DC
  Administered 2017-11-12: 3.125 mg via ORAL
  Filled 2017-11-11 (×2): qty 1

## 2017-11-11 MED ORDER — SENNOSIDES-DOCUSATE SODIUM 8.6-50 MG PO TABS
1.0000 | ORAL_TABLET | Freq: Two times a day (BID) | ORAL | Status: DC
  Administered 2017-11-11: 1 via ORAL
  Filled 2017-11-11 (×3): qty 1

## 2017-11-11 MED ORDER — DEXTROSE 50 % IV SOLN
50.0000 mL | Freq: Once | INTRAVENOUS | Status: AC
  Administered 2017-11-11: 50 mL via INTRAVENOUS

## 2017-11-11 MED ORDER — POLYETHYLENE GLYCOL 3350 17 G PO PACK
17.0000 g | PACK | Freq: Every day | ORAL | Status: DC
  Administered 2017-11-11: 17 g via ORAL
  Filled 2017-11-11: qty 1

## 2017-11-11 MED ORDER — SODIUM BICARBONATE 650 MG PO TABS
1300.0000 mg | ORAL_TABLET | Freq: Two times a day (BID) | ORAL | Status: DC
  Administered 2017-11-11 – 2017-11-12 (×2): 1300 mg via ORAL
  Filled 2017-11-11 (×2): qty 2

## 2017-11-11 MED ORDER — TAMSULOSIN HCL 0.4 MG PO CAPS
0.4000 mg | ORAL_CAPSULE | Freq: Every day | ORAL | Status: DC
Start: 2017-11-11 — End: 2017-11-12
  Administered 2017-11-11 – 2017-11-12 (×2): 0.4 mg via ORAL
  Filled 2017-11-11 (×2): qty 1

## 2017-11-11 MED ORDER — DOXYCYCLINE HYCLATE 100 MG PO TABS
100.0000 mg | ORAL_TABLET | Freq: Two times a day (BID) | ORAL | Status: DC
  Administered 2017-11-11 – 2017-11-12 (×2): 100 mg via ORAL
  Filled 2017-11-11 (×3): qty 1

## 2017-11-11 NOTE — Progress Notes (Signed)
Hypoglycemic Event  CBG: 26  Treatment: 50 mL dextrose 50% IV 240 mL apple juice 240 mL milk   Symptoms: lethargic  Follow-up CBG: Time:0715 CBG Result:112  Possible Reasons for Event: ate HS snack, no HS insulin - unclear/  Comments/MD notified: On call NP Schorr paged and notified     Nori RiisLockwood, Trysten Berti Lynn

## 2017-11-11 NOTE — Progress Notes (Signed)
Patient has no IV access, multiple attempts made.  MD aware. Patient tele d/c'd and possible d/c home 3/8 once bed available for SNF. Will continue to monitor patient closely.

## 2017-11-11 NOTE — Progress Notes (Signed)
CSW following to assist patient with dc planning to SNF.  CSW followed up with patient's preferred SNF,  [facility]. Staff reported that they are unable to offer patient a bed because patient has an outstanding balance from last SNF admission.   CSW provided update to patient, patient requested that CSW contact her husband to see if he would pay balance at preferred SNF. CSW agreed to contact patient's husband and provided patient with additional SNF offers.  CSW contacted patient's husband Cletis Media(Michael Sedberry [phone]), no answer. CSW left voicemail requesting return phone call.  CSW will continue to follow and assist with discharge planning.  Celso SickleKimberly Tajay Muzzy, [place]LCSWA Clinical Social Worker [facility] Cell#: [phone]

## 2017-11-11 NOTE — Clinical Social Work Note (Signed)
Clinical Social Work Assessment  Patient Details  Name: [name] MRN: [id number] Date of Birth: 1966-04-03  Date of referral:  11/11/17               Reason for consult:  Facility Placement                Permission sought to share information with:  Facility Medical sales representativeContact Representative, Family Supports Permission granted to share information::  Yes, Verbal Permission Granted  Name::     Cletis MediaMichael Masur   Agency::     Relationship::  Husband   Contact Information:  [phone]  Housing/Transportation Living arrangements for the past 2 months:  Single Family Home Source of Information:  Patient Patient Interpreter Needed:  None Criminal Activity/Legal Involvement Pertinent to Current Situation/Hospitalization:  No - Comment as needed Significant Relationships:  Adult Children, Spouse Lives with:  Adult Children, Spouse Do you feel safe going back to the place where you live?  (PT recommending SNF) Need for family participation in patient care:  Yes (Comment)  Care giving concerns:  Patient from home with husband. Patient reported that she is independent with ADLs at baseline and utilizes a walker sometimes. PT recommending SNF.   Social Worker assessment / plan:  CSW spoke with patient at bedside regarding PT recommendation for SNF. Patient reported that she is agreeable and [facility] noting she was there recently after she broke her hip. CSW agreed to complete FL2 and follow up with [facility].  CSW completed FL2 and will follow up with [facility].  CSW will continue to follow and assist with discharge planning.  Employment status:  Disabled (Comment on whether or not currently receiving Disability) Insurance information:  Medicare PT Recommendations:  Skilled Nursing Facility Information / Referral to community resources:  Skilled Nursing Facility  Patient/Family's Response to care:  Patient appreciative of CSW assistance with discharge  planning.   Patient/Family's Understanding of and Emotional Response to Diagnosis, Current Treatment, and Prognosis:  Patient presented calm and verbalized little understanding of diagnosis, noting she forgot why she initially came to the hospital. Patient verbalized plan to dc to SNF to regain strength.   Emotional Assessment Appearance:  Appears stated age Attitude/Demeanor/Rapport:  Other(Cooperative) Affect (typically observed):  Appropriate, Calm Orientation:  Oriented to Self, Oriented to Place, Oriented to  Time, Oriented to Situation(Patient forgetful at times) Alcohol / Substance use:  Not Applicable Psych involvement (Current and /or in the community):  No (Comment)  Discharge Needs  Concerns to be addressed:  Care Coordination Readmission within the last 30 days:  No Current discharge risk:  Physical Impairment Barriers to Discharge:  No Barriers Identified   Antionette PolesKimberly L Tyreesha Maharaj, LCSW 11/11/2017, 1:30 PM

## 2017-11-11 NOTE — Progress Notes (Signed)
PROGRESS NOTE  [name] UJW:[id number] DOB: 06-27-1966 DOA: 11/04/2017 PCP: Dois Davenportichter, Karen L, MD  HPI/Recap of past 24 hours:  Had a hypoglycemic episode early this morning, corrected  Overall she looks stronger today  Assessment/Plan: Principal Problem:   Localized swelling of lower extremity Active Problems:   Immunosuppression (HCC)   Hypothyroidism   H/O kidney transplant   Essential hypertension   Uncontrolled type 1 diabetes mellitus with foot ulcer (HCC)   Anemia of chronic disease  Bilateral lower extremity with cellulitis -Venous Doppler no DVT -She also reports right hip erythema and warmth, she has history of hardware.  Orthopedic consulted recommended MRI of the hip which did not show septic arthritis. Although her fracture has not completely healed, also recommended exchange of her school once infection treated --Blood culture negative -she has been on IV cefepime and oral Zyvox , I have discussed case with infectious disease Dr. Ninetta LightsHatcher, abx  changed to doxycycline to cover cellulitis.  Acute renal failure on chronic kidney disease Stage 3, s/prenal transplant on chronic immunosuppressive therapy Nephrology consulted, put appreciated -She is to follow with nephrology outpatient  Metabolic acidosis Continue bicarb supplement  Insulin-dependent diabetes She is also on chronic steroid Adjust insulin as needed  Hypertension She does not appear to require high-dose blood pressure medication DC hydralazine, decrease coreg dose  Chronic hypoxic respiratory failure on home oxygen Stable, chest x-ray no acute findings She denies that she is on home oxygen Wean oxygen  Failure to thrive Skilled nursing facility placement  Poor IV access This is being limited by h/o bilateral av fistula/avg placement  Code Status: full  Family Communication: patient   Disposition Plan: SNF placement hopefully tomorrow when bed  available  Consultants:  Infectious disease  Orthopedic  Nephrology  Procedures:  None  Antibiotics:  As above   Objective: BP (!) 106/53   Pulse (!) 59   Temp 97.9 F (36.6 C) (Oral)   Resp 20   Ht 5\' 7"  (1.702 m)   Wt 84.3 kg (185 lb 13.6 oz)   LMP 11/19/2011   SpO2 92%   BMI 29.11 kg/m   Intake/Output Summary (Last 24 hours) at 11/11/2017 1924 Last data filed at 11/11/2017 1800 Gross per 24 hour  Intake 990 ml  Output 380 ml  Net 610 ml   Filed Weights   11/05/17 0235 11/06/17 0504  Weight: 82.4 kg (181 lb 9.6 oz) 84.3 kg (185 lb 13.6 oz)    Exam: Patient is examined daily including today on 11/11/2017, exams remain the same as of yesterday except that has changed    General: Chronically ill-appearing ,NAD, looks stronger today  Cardiovascular: RRR  Respiratory: CTABL  Abdomen: Soft/ND/NT, positive BS  Musculoskeletal: Lower extremity edema is improving skin start to wrinkle , chronic venous stasis changes   Neuro: alert, oriented   Data Reviewed: Basic Metabolic Panel: Recent Labs  Lab 11/06/17 0515 11/07/17 0531 11/08/17 0534 11/09/17 0530 11/10/17 0528 11/11/17 0501  NA 140 137  138 138  140 137 134* 132*  K 3.9 3.4*  3.4* 4.0  4.0 3.7 4.8 4.5  CL 106 100*  100* 101  103 102 101 101  CO2 24 27  28 27  28 28 22 22   GLUCOSE 120* 151*  152* 271*  276* 73 239* 26*  BUN 58* 51*  52* 50*  52* 45* 48* 49*  CREATININE 2.42* 2.14*  2.12* 2.15*  2.14* 1.93* 2.10* 2.32*  CALCIUM 8.6* 8.3*  8.3*  8.6*  8.7* 8.7* 8.5* 8.6*  MG 1.9 1.8 1.9 1.8 1.9  --   PHOS 3.8 2.5  2.6 2.7  2.7 2.1* 3.1  --    Liver Function Tests: Recent Labs  Lab 11/06/17 0515 11/07/17 0531 11/08/17 0534 11/09/17 0530 11/10/17 0528  AST 12* 13* 12* 13* 11*  ALT 9* 8* 9* 9* 9*  ALKPHOS 79 83 97 90 98  BILITOT 0.5 0.5 0.9 0.5 0.9  PROT 5.0* 5.0* 5.2* 5.3* 5.2*  ALBUMIN 1.7* 1.6*  1.5* 1.6*  1.6* 1.7* 1.6*   No results for input(s): LIPASE, AMYLASE  in the last 168 hours. No results for input(s): AMMONIA in the last 168 hours. CBC: Recent Labs  Lab 11/06/17 0515 11/07/17 0531 11/07/17 1603 11/08/17 0534 11/09/17 0530 11/10/17 0528  WBC 7.7 7.4  --  6.0 6.9 7.8  NEUTROABS 6.6 6.1  --  4.6 5.3 6.3  HGB 9.1* 9.0*  --  8.8* 9.2* 8.9*  HCT 29.2* 28.8*  --  28.6* 30.0* 29.5*  MCV 91.8 91.1  --  93.2 91.2 94.9  PLT 128* 125* 159 153 210 246   Cardiac Enzymes:   No results for input(s): CKTOTAL, CKMB, CKMBINDEX, TROPONINI in the last 168 hours. BNP (last 3 results) Recent Labs    04/06/17 1941 05/09/17 0853 08/31/17 1535  BNP 137.1* 282.1* 1,022.8*    ProBNP (last 3 results) No results for input(s): PROBNP in the last 8760 hours.  CBG: Recent Labs  Lab 11/10/17 1740 11/10/17 2103 11/11/17 0726 11/11/17 1205 11/11/17 1645  GLUCAP 139* 102* 112* 141* 206*    Recent Results (from the past 240 hour(s))  MRSA PCR Screening     Status: Abnormal   Collection Time: 11/05/17  3:19 AM  Result Value Ref Range Status   MRSA by PCR POSITIVE (A) NEGATIVE Final    Comment:        The GeneXpert MRSA Assay (FDA approved for NASAL specimens only), is one component of a comprehensive MRSA colonization surveillance program. It is not intended to diagnose MRSA infection nor to guide or monitor treatment for MRSA infections. RESULT CALLED TO, READ BACK BY AND VERIFIED WITH: HOLT,B @ 1514 ON 161096 BY POTEAT,S Performed at [facility], [address] [place], [place] [address]   Culture, blood (Routine X 2) w Reflex to ID Panel     Status: None (Preliminary result)   Collection Time: 11/07/17  2:59 PM  Result Value Ref Range Status   Specimen Description   Final    LEFT ANTECUBITAL Performed at [facility], [address] [place], [place] [address]    Special Requests   Final    IN PEDIATRIC BOTTLE Blood Culture adequate volume Performed at [facility], [address], [place], [place] [address]    Culture   Final    NO GROWTH 3 DAYS Performed at [facility], [address], [place], [place] [address]    Report Status PENDING  Incomplete  Culture, blood (single)     Status: None (Preliminary result)   Collection Time: 11/07/17  3:12 PM  Result Value Ref Range Status   Specimen Description   Final    BLOOD LEFT HAND Performed at [facility], [address], [place], [place] [address]    Special Requests   Final    BOTTLES DRAWN AEROBIC ONLY Blood Culture adequate volume Performed at [facility], [address]  [place], [place] [address]    Culture   Final    NO GROWTH 3 DAYS Performed at [facility], [address] [place], [place] [address]    Report Status PENDING  Incomplete     Studies: Ct Renal Stone Study  Result Date: 11/11/2017 CLINICAL DATA:  Renal failure and metabolic acidosis EXAM: CT ABDOMEN AND PELVIS WITHOUT CONTRAST TECHNIQUE: Multidetector CT imaging of the abdomen and pelvis was performed following the standard protocol without IV contrast. COMPARISON:  11/05/2017 FINDINGS: Lower chest: Small bilateral pleural effusions are noted which are new from the prior exam. Left lower lobe mild infiltrate is noted as well new from the prior exam. Hepatobiliary: Focal fatty infiltration of the liver is noted along the falciform ligament. The gallbladder is well distended. No other focal abnormality is noted. Pancreas: Unremarkable. No pancreatic ductal dilatation or surrounding inflammatory changes. Spleen: Normal in size without focal abnormality. Adrenals/Urinary Tract: Adrenal glands are within normal limits. The native kidneys are again shrunken with diffuse renal vascular calcifications. A transplant kidney is noted in the right lower quadrant without mass lesion or hydronephrosis. No renal calculi are seen. The bladder is well distended. Few small foci of air are noted within  the bladder which may be related to recent instrumentation. Clinical correlation is recommended. Stomach/Bowel: No obstructive or inflammatory changes are noted. The appendix is within normal limits. Vascular/Lymphatic: Aortic atherosclerosis. No enlarged abdominal or pelvic lymph nodes. Reproductive: Uterus and bilateral adnexa are unremarkable. Other: Minimal free fluid is noted within the pelvis. Changes of edema are noted within the subcutaneous tissues. Small fat containing umbilical hernia is noted. Musculoskeletal: Postsurgical changes are noted in the right hip. Degenerative changes of lumbar spine are noted. IMPRESSION: Atrophic native kidneys with normal appearing transplant kidney in the right iliac fossa. Changes consistent with subcutaneous edema No acute abnormality is noted within the abdomen and pelvis. The overall appearance is stable from the prior exam. Electronically Signed   By: Alcide Clever M.D.   On: 11/11/2017 11:12    Scheduled Meds: . carvedilol  3.125 mg Oral BID  . doxycycline  100 mg Oral Q12H  . heparin injection (subcutaneous)  5,000 Units Subcutaneous Q8H  . insulin aspart  0-15 Units Subcutaneous TID WC  . insulin aspart  0-5 Units Subcutaneous QHS  . levothyroxine  137 mcg Oral QAC breakfast  . mouth rinse  15 mL Mouth Rinse BID  . multivitamin with minerals  1 tablet Oral Daily  . mycophenolate  500 mg Oral BID  . nystatin   Topical TID  . polyethylene glycol  17 g Oral Daily  . predniSONE  5 mg Oral Daily  . protein supplement shake  11 oz Oral BID BM  . senna-docusate  1 tablet Oral BID  . sodium bicarbonate  1,300 mg Oral BID  . tacrolimus  0.5 mg Oral QAC breakfast  . tacrolimus  1 mg Oral q1800  . tamsulosin  0.4 mg Oral Daily    Continuous Infusions:    Time spent: , case discussed with infectious disease I have personally reviewed and interpreted on  11/11/2017 daily labs, tele strips, imagings as discussed above under date review session  and assessment and plans.  I reviewed all nursing notes, pharmacy notes, consultant notes,  vitals, pertinent old records  I have discussed plan of care as described above with RN , patient  on 11/11/2017   Albertine Grates MD, PhD  [facility] Pager [phone]. If 7PM-7AM, please contact  night-coverage at [email or web address], password [facility] 11/11/2017, 7:24 PM  LOS: 6 days

## 2017-11-11 NOTE — NC FL2 (Signed)
[place] MEDICAID FL2 LEVEL OF CARE SCREENING TOOL     IDENTIFICATION  Patient Name: [name] Birthdate: May 15, 1966 Sex: female Admission Date (Current Location): 11/04/2017  [facility] and [place] Number:  Producer, television/film/video and Address:  [facility],  [address] [place] [address]      Provider Number: [id number]  Attending Physician Name and Address:  Albertine Grates, MD  Relative Name and Phone Number:       Current Level of Care: Hospital Recommended Level of Care: Skilled Nursing Facility Prior Approval Number:    Date Approved/Denied:   PASRR Number: [id number]  Discharge Plan: SNF    Current Diagnoses: Patient Active Problem List   Diagnosis Date Noted  . Localized swelling of lower extremity 11/05/2017  . Malnutrition of moderate degree 09/02/2017  . Pneumonia   . Status post kidney transplant 06/02/2017  . Closed displaced fracture of right femoral neck (HCC) 06/02/2017  . Femoral neck fracture (HCC) 06/02/2017  . Hyperglycemia 05/09/2017  . Lower extremity cellulitis 03/23/2017  . Type II diabetes mellitus (HCC) 03/23/2017  . Anemia of chronic disease 01/26/2017  . Abnormal LFTs 01/26/2017  . DKA (diabetic ketoacidoses) (HCC) 08/12/2016  . Hypoglycemia   . Wound cellulitis   . CKD (chronic kidney disease) stage 3, GFR 30-59 ml/min (HCC) 06/26/2016  . Cellulitis of right lower extremity   . QT prolongation 05/13/2016  . UTI (lower urinary tract infection)   . Hypomagnesemia   . Pressure ulcer 04/11/2016  . Acute encephalopathy 04/11/2016  . Altered mental status 04/10/2016  . Insulin dependent diabetes mellitus (HCC) 04/10/2016  . Diabetic ketoacidosis without coma associated with type 2 diabetes mellitus (HCC) 11/02/2015  . Uncontrolled type 1 diabetes mellitus with foot ulcer (HCC) 06/04/2015  . Foot ulcer, right (HCC)   . Diarrhea 06/02/2015  . Type 1 diabetes mellitus with diabetic foot ulcer (HCC) 06/02/2015  . Acute  renal failure superimposed on stage 3 chronic kidney disease (HCC) 06/02/2015  . Diabetic foot infection (HCC) 06/01/2015  . Essential hypertension 06/01/2015  . GERD (gastroesophageal reflux disease) 06/01/2015  . AKI (acute kidney injury) (HCC) 01/02/2015  . H/O kidney transplant 08/04/2012  . Hyponatremia 09/22/2011  . Immunosuppression (HCC)   . Hypothyroidism     Orientation RESPIRATION BLADDER Height & Weight     Self, Time, Situation, Place  Normal Continent Weight: 185 lb 13.6 oz (84.3 kg) Height:  5\' 7"  (170.2 cm)  BEHAVIORAL SYMPTOMS/MOOD NEUROLOGICAL BOWEL NUTRITION STATUS        Diet(dysphagia 2 )  AMBULATORY STATUS COMMUNICATION OF NEEDS Skin   Limited Assist Verbally Normal                       Personal Care Assistance Level of Assistance  Bathing, Feeding, Dressing Bathing Assistance: Maximum assistance Feeding assistance: Independent Dressing Assistance: Maximum assistance     Functional Limitations Info  Sight, Hearing, Speech Sight Info: Adequate Hearing Info: Adequate Speech Info: Adequate    SPECIAL CARE FACTORS FREQUENCY  PT (By licensed PT), OT (By licensed OT)     PT Frequency: 5x/week OT Frequency: 5x/week            Contractures Contractures Info: Not present    Additional Factors Info  Code Status, Allergies, Isolation Precautions, Insulin Sliding Scale Code Status Info: Full Code Allergies Info: Latex;Ace Inhibitors;Lisinopril;   Insulin Sliding Scale Info: insulin aspart 0-15 UnitsSubcutaneousTID WC insulin aspart 0-5 UnitsSubcutaneousQHS insulin aspart protamine- aspart 15 UnitsSubcutaneousBID  AC Isolation Precautions Info: Contact Precautions       Infection:MRSA     Current Medications (11/11/2017):  This is the current hospital active medication list Current Facility-Administered Medications  Medication Dose Route Frequency Provider Last Rate Last Dose  . acetaminophen (TYLENOL) tablet 650 mg  650 mg Oral Q6H PRN Madelyn Flavors A, MD   650 mg at 11/05/17 0305   Or  . acetaminophen (TYLENOL) suppository 650 mg  650 mg Rectal Q6H PRN Madelyn Flavors A, MD      . carvedilol (COREG) tablet 3.125 mg  3.125 mg Oral BID Albertine Grates, MD   Stopped at 11/11/17 1000  . ceFEPIme (MAXIPIME) 2 g in sodium chloride 0.9 % 100 mL IVPB  2 g Intravenous Q24H Hessie Knows, [facility]   Stopped at 11/10/17 1855  . diphenhydrAMINE (BENADRYL) injection 12.5 mg  12.5 mg Intravenous Q8H PRN Madelyn Flavors A, MD      . heparin injection 5,000 Units  5,000 Units Subcutaneous [address] Boyd, DO   5,000 Units at 11/11/17 0541  . HYDROcodone-acetaminophen (NORCO/VICODIN) 5-325 MG per tablet 1-2 tablet  1-2 tablet Oral Q6H PRN Macon Large, NP   2 tablet at 11/11/17 0214  . insulin aspart (novoLOG) injection 0-15 Units  0-15 Units Subcutaneous TID WC Clydie Braun, MD   2 Units at 11/11/17 1209  . insulin aspart (novoLOG) injection 0-5 Units  0-5 Units Subcutaneous QHS Clydie Braun, MD   3 Units at 11/05/17 0303  . ipratropium-albuterol (DUONEB) 0.5-2.5 (3) MG/3ML nebulizer solution 3 mL  3 mL Nebulization Q4H PRN Smith, Rondell A, MD      . levothyroxine (SYNTHROID, LEVOTHROID) tablet 137 mcg  137 mcg Oral QAC breakfast Madelyn Flavors A, MD   137 mcg at 11/11/17 0850  . linezolid (ZYVOX) tablet 600 mg  600 mg Oral Q12H Ginnie Smart, MD   600 mg at 11/11/17 0851  . lip balm (CARMEX) ointment   Topical PRN Albertine Grates, MD      . MEDLINE mouth rinse  15 mL Mouth Rinse BID Madelyn Flavors A, MD   15 mL at 11/11/17 0854  . multivitamin with minerals tablet 1 tablet  1 tablet Oral Daily Madelyn Flavors A, MD   1 tablet at 11/11/17 0850  . mycophenolate (CELLCEPT) capsule 500 mg  500 mg Oral BID Madelyn Flavors A, MD   500 mg at 11/11/17 0851  . nystatin (MYCOSTATIN/NYSTOP) topical powder   Topical TID Marguerita Merles Latif, DO      . ondansetron [facility]) tablet 4 mg  4 mg Oral Q6H PRN Madelyn Flavors A, MD       Or  . ondansetron  (ZOFRAN) injection 4 mg  4 mg Intravenous Q6H PRN Madelyn Flavors A, MD   4 mg at 11/09/17 0431  . polyethylene glycol (MIRALAX / GLYCOLAX) packet 17 g  17 g Oral Daily Albertine Grates, MD   17 g at 11/11/17 1214  . predniSONE (DELTASONE) tablet 5 mg  5 mg Oral Daily Katrinka Blazing, Rondell A, MD   5 mg at 11/11/17 0850  . protein supplement (PREMIER PROTEIN) liquid  11 oz Oral BID BM Madelyn Flavors A, MD   11 oz at 11/09/17 1232  . senna-docusate (Senokot-S) tablet 1 tablet  1 tablet Oral BID Albertine Grates, MD   1 tablet at 11/11/17 1214  . sodium bicarbonate tablet 1,300 mg  1,300 mg Oral BID Albertine Grates, MD      .  tacrolimus (PROGRAF) capsule 0.5 mg  0.5 mg Oral QAC breakfast Madelyn FlavorsSmith, Rondell A, MD   0.5 mg at 11/11/17 0851  . tacrolimus (PROGRAF) capsule 1 mg  1 mg Oral q1800 Madelyn FlavorsSmith, Rondell A, MD   1 mg at 11/10/17 1825  . tamsulosin (FLOMAX) capsule 0.4 mg  0.4 mg Oral Daily Albertine GratesXu, Fang, MD   0.4 mg at 11/11/17 1214  . triamcinolone 0.1 % cream : eucerin cream, 1:1   Topical BID PRN Clydie BraunSmith, Rondell A, MD         Discharge Medications: Please see discharge summary for a list of discharge medications.  Relevant Imaging Results:  Relevant Lab Results:   Additional Information SSN: [id number]  Antionette PolesKimberly L Jamelyn Bovard, LCSW

## 2017-11-11 NOTE — Progress Notes (Signed)
Pt bedding and gown wet.  Unable to discern if the wetness is sweat or urine.  Pt is afebrile.  Attempt to get pt oob to bsc failed.  Pt too weak.  Pt denies sensation of full bladder.  Bladder scan 30 - 87.  AM labs have not resulted yet.  On call md notified.

## 2017-11-12 ENCOUNTER — Encounter (HOSPITAL_COMMUNITY): Payer: Self-pay

## 2017-11-12 LAB — CBC WITH DIFFERENTIAL/PLATELET
Basophils Absolute: 0 10*3/uL (ref 0.0–0.1)
Basophils Relative: 0 %
EOS ABS: 0.1 10*3/uL (ref 0.0–0.7)
EOS PCT: 1 %
HCT: 29.5 % — ABNORMAL LOW (ref 36.0–46.0)
HEMOGLOBIN: 9.2 g/dL — AB (ref 12.0–15.0)
LYMPHS ABS: 0.9 10*3/uL (ref 0.7–4.0)
Lymphocytes Relative: 9 %
MCH: 28.7 pg (ref 26.0–34.0)
MCHC: 31.2 g/dL (ref 30.0–36.0)
MCV: 91.9 fL (ref 78.0–100.0)
Monocytes Absolute: 0.7 10*3/uL (ref 0.1–1.0)
Monocytes Relative: 7 %
Neutro Abs: 8.1 10*3/uL — ABNORMAL HIGH (ref 1.7–7.7)
Neutrophils Relative %: 83 %
PLATELETS: 325 10*3/uL (ref 150–400)
RBC: 3.21 MIL/uL — AB (ref 3.87–5.11)
RDW: 14.7 % (ref 11.5–15.5)
WBC: 9.7 10*3/uL (ref 4.0–10.5)

## 2017-11-12 LAB — BASIC METABOLIC PANEL
Anion gap: 9 (ref 5–15)
BUN: 49 mg/dL — AB (ref 6–20)
CHLORIDE: 101 mmol/L (ref 101–111)
CO2: 22 mmol/L (ref 22–32)
Calcium: 8.2 mg/dL — ABNORMAL LOW (ref 8.9–10.3)
Creatinine, Ser: 2.42 mg/dL — ABNORMAL HIGH (ref 0.44–1.00)
GFR calc Af Amer: 26 mL/min — ABNORMAL LOW (ref >60)
GFR calc non Af Amer: 22 mL/min — ABNORMAL LOW (ref >60)
GLUCOSE: 201 mg/dL — AB (ref 65–99)
POTASSIUM: 5.1 mmol/L (ref 3.5–5.1)
SODIUM: 132 mmol/L — AB (ref 135–145)

## 2017-11-12 LAB — GLUCOSE, CAPILLARY
GLUCOSE-CAPILLARY: 194 mg/dL — AB (ref 65–99)
Glucose-Capillary: 208 mg/dL — ABNORMAL HIGH (ref 65–99)
Glucose-Capillary: 214 mg/dL — ABNORMAL HIGH (ref 65–99)

## 2017-11-12 MED ORDER — CARVEDILOL 25 MG PO TABS: 25.0000 mg | ORAL_TABLET | Freq: Two times a day (BID) | ORAL | 0 refills | Status: DC

## 2017-11-12 MED ORDER — LIP MEDEX EX OINT
TOPICAL_OINTMENT | CUTANEOUS | Status: DC | PRN
  Filled 2017-11-12: qty 7

## 2017-11-12 MED ORDER — BLISTEX MEDICATED EX OINT: 1.0000 "application " | TOPICAL_OINTMENT | CUTANEOUS | 0 refills | Status: DC | PRN

## 2017-11-12 MED ORDER — DOXYCYCLINE HYCLATE 100 MG PO TABS: 100.0000 mg | ORAL_TABLET | Freq: Two times a day (BID) | ORAL | 0 refills | Status: DC

## 2017-11-12 MED ORDER — NYSTATIN 100000 UNIT/GM EX POWD: Freq: Three times a day (TID) | CUTANEOUS | 0 refills | Status: DC

## 2017-11-12 MED ORDER — HYDROCODONE-ACETAMINOPHEN 5-325 MG PO TABS: 1.0000 | ORAL_TABLET | Freq: Four times a day (QID) | ORAL | 0 refills | Status: DC | PRN

## 2017-11-12 MED ORDER — SENNOSIDES-DOCUSATE SODIUM 8.6-50 MG PO TABS: 1.0000 | ORAL_TABLET | Freq: Every day | ORAL | 0 refills | Status: AC

## 2017-11-12 MED ORDER — ACETAMINOPHEN 325 MG PO TABS: 325.0000 mg | ORAL_TABLET | Freq: Four times a day (QID) | ORAL | Status: DC | PRN

## 2017-11-12 MED ORDER — POLYETHYLENE GLYCOL 3350 17 G PO PACK: 17.0000 g | PACK | Freq: Every day | ORAL | Status: DC | PRN

## 2017-11-12 MED ORDER — SENNOSIDES-DOCUSATE SODIUM 8.6-50 MG PO TABS: 1.0000 | ORAL_TABLET | Freq: Every evening | ORAL | Status: DC | PRN

## 2017-11-12 NOTE — Care Management Note (Signed)
Case Management Note  Patient Details  Name: [name] MRN: [id number] Date of Birth: Mar 18, 1966  Subjective/Objective: Patient declines SNF[facility] Karen aware. Address:[address] [place] [id number] [address] [facility] for ambulance transp @ d/c-forms in shadow chart--nurse to call when ready. No further CM needs.                   Action/Plan:d/c home w/HHC/[facility]   Expected Discharge Date:                  Expected Discharge Plan:  Home w Home Health Services  In-House Referral:     Discharge planning Services  CM Consult  Post Acute Care Choice:    Choice offered to:  Patient  DME Arranged:    DME Agency:     HH Arranged:  RN, PT, OT, Nurse's Aide, Social Work [employer] Agency:  [facility]  Status of Service:  Completed, signed off  If discussed at [employer] Length of [employer] Meetings, dates discussed:    Additional Comments:  Lanier ClamMahabir, Thatcher Doberstein, RN 11/12/2017, 11:56 AM

## 2017-11-12 NOTE — Progress Notes (Signed)
Physical Therapy Treatment Patient Details Name: [name] MRN: [id number] DOB: [birth year] Today's Date: 11/12/2017    History of Present Illness 52 yo female admitted with bil LE cellulitis, possible L pubic ramus fx?[name]. Hx of R hip pinning 05/2017, HTN, CKD, DM, DVT, chronic LE edema, renal transplant, great toe amputation.     PT Comments    Pt received in bed and agreeable to participation in therapy. Pt with primary c/o pain BLE, R>L. Pt participated in LE exercises prior to transferring bed to recliner. Pt required min assist bed mobility, mod assist transfers and mod assist ambulation 4 feet. Pt very fearful of falling requiring encouragement to participate and cues to focus and relax. Pt positioned in recliner with feet elevated at end of session.    Follow Up Recommendations  SNF     Equipment Recommendations  None recommended by PT    Recommendations for Other Services       Precautions / Restrictions Precautions Precautions: Fall    Mobility  Bed Mobility Overal bed mobility: Needs Assistance       Supine to sit: HOB elevated;Min assist     General bed mobility comments: +rail, assist with RLE and to elevate trunk  Transfers Overall transfer level: Needs assistance Equipment used: Rolling walker (2 wheeled)   Sit to Stand: Mod assist;From elevated surface         General transfer comment: Pt used momentum to assist with comin to stance. Retropulsive with initial stance requiring increased time and cues to shift weight and stabilize core. Trunk remained in mild flexion.    Ambulation/Gait Ambulation/Gait assistance: Mod assist Ambulation Distance (Feet): 4 Feet Assistive device: Rolling walker (2 wheeled) Gait Pattern/deviations: Step-to pattern;Decreased stride length;Antalgic;Trunk flexed Gait velocity: decreased Gait velocity interpretation: Below normal speed for age/gender General Gait Details: Max encouragement needed. Pt very fearful of  mobility. Increased time to complete. Assist to maintain balance.     Stairs            Wheelchair Mobility    Modified Rankin (Stroke Patients Only)       Balance Overall balance assessment: Needs assistance Sitting-balance support: No upper extremity supported;Feet supported Sitting balance-Leahy Scale: Fair     Standing balance support: Bilateral upper extremity supported;During functional activity Standing balance-Leahy Scale: Poor Standing balance comment: needs RW and external support                             Cognition Arousal/Alertness: Awake/alert Behavior During Therapy: WFL for tasks assessed/performed Overall Cognitive Status: Within Functional Limits for tasks assessed                                        Exercises General Exercises - Lower Extremity Ankle Circles/Pumps: AROM;Both;10 reps;Supine Heel Slides: AROM;AAROM;Right;Left;10 reps;Supine    General Comments        Pertinent Vitals/Pain Pain Assessment: No/denies pain Pain Score: 9  Pain Location: bilat distal LE Pain Descriptors / Indicators: Sharp;Sore;Guarding;Grimacing Pain Intervention(s): Monitored during session;Limited activity within patient's tolerance;Repositioned    Home Living                      Prior Function            PT Goals (current goals can now be found in the care plan section) Acute Rehab PT Goals  Patient Stated Goal: less pain PT Goal Formulation: With patient Time For Goal Achievement: 11/24/17 Potential to Achieve Goals: Good Progress towards PT goals: Progressing toward goals    Frequency    Min 3X/week      PT Plan Current plan remains appropriate    Co-evaluation              AM-PAC PT "6 Clicks" Daily Activity  Outcome Measure  Difficulty turning over in bed (including adjusting bedclothes, sheets and blankets)?: A Lot Difficulty moving from lying on back to sitting on the side of the bed? :  Unable Difficulty sitting down on and standing up from a chair with arms (e.g., wheelchair, bedside commode, etc,.)?: Unable Help needed moving to and from a bed to chair (including a wheelchair)?: A Lot Help needed walking in hospital room?: A Lot Help needed climbing 3-5 steps with a railing? : Total 6 Click Score: 9    End of Session Equipment Utilized During Treatment: Gait belt Activity Tolerance: Patient limited by pain Patient left: in chair;with call bell/phone within reach Nurse Communication: Mobility status PT Visit Diagnosis: Muscle weakness (generalized) (M62.81);Difficulty in walking, not elsewhere classified (R26.2)     Time: 4098-1191 PT Time Calculation (min) (ACUTE ONLY): 24 min  Charges:  $Therapeutic Exercise: 8-22 mins $Therapeutic Activity: 8-22 mins                    G Codes:       Aida Raider, PT  Office # [phone] Pager [phone]    Ilda Foil 11/12/2017, 11:03 AM

## 2017-11-12 NOTE — Care Management Important Message (Signed)
Important Message  Patient Details IM Letter given to Kathy/Case Manager to present to the Patient Name: [name] MRN: [id number] Date of Birth: August 20, 1966   Medicare Important Message Given:  Yes    Caren MacadamFuller, Mecca Barga 11/12/2017, 12:01 PM

## 2017-11-12 NOTE — Progress Notes (Signed)
Occupational Therapy Treatment Patient Details Name: [name] MRN: [id number] DOB: Dec 05, 1965 Today's Date: 11/12/2017    History of present illness 52 yo female admitted with bil LE cellulitis, possible L pubic ramus fx?[name] Hx of R hip pinning 05/2017, HTN, CKD, DM, DVT, chronic LE edema, renal transplant, great toe amputation.    OT comments  This 52 yo female admitted with above presents to acute OT making progress towards, goals but still needs a lot of A with mobility and self care. Still feel like SNF is best option; however plan is now home. Pt did not have a 3n1 recommended due to plan was SNF, I spoke directly to CM and she said she would call pt's husband to let him know he needs to pick one up.  Follow Up Recommendations  SNF;Supervision/Assistance - 24 hour    Equipment Recommendations  3 in 1 bedside commode;Other (comment)(I verbally made CM aware since plan as of today was changed from SNF to [facility])       Precautions / Restrictions Precautions Precautions: Fall Restrictions Weight Bearing Restrictions: No       Mobility Bed Mobility Overal bed mobility: Needs Assistance Bed Mobility: Sit to Supine     Sit to supine: Mod assist    Transfers Overall transfer level: Needs assistance Equipment used: Rolling walker (2 wheeled) Transfers: Sit to/from [employer] Sit to Stand: Mod assist Stand pivot transfers: Mod assist       General transfer comment: Worked with pt to on sit<>stand x5 (with her scooting out to front of recliner, bringing legs under her good, big rocks for sit to stand to use momentum to help her, once in standing working on fully upright due to to tight calfs; stand>sit without plopping)--pt Mod A on all trials    Balance Overall balance assessment: Needs assistance Sitting-balance support: No upper extremity supported;Feet supported Sitting balance-Leahy Scale: Good     Standing balance support: Bilateral upper extremity  supported;During functional activity Standing balance-Leahy Scale: Poor Standing balance comment: needs RW and external support,posterior tendency with turning with RW                           ADL either performed or assessed with clinical judgement   ADL Overall ADL's : Needs assistance/impaired                     Lower Body Dressing: Moderate assistance Lower Body Dressing Details (indicate cue type and reason): Mod A sit<>stand Toilet Transfer: Moderate assistance;Stand-pivot;BSC;RW Toilet Transfer Details (indicate cue type and reason): tendency for posterior lean Toileting- Clothing Manipulation and Hygiene: Maximal assistance Toileting - Clothing Manipulation Details (indicate cue type and reason): Mod A sit<>stand             Vision Patient Visual Report: No change from baseline            Cognition Arousal/Alertness: Awake/alert Behavior During Therapy: WFL for tasks assessed/performed Overall Cognitive Status: Within Functional Limits for tasks assessed                                 General Comments: Does report that her husband does her meds for her                   Pertinent Vitals/ Pain       Pain Assessment: 0-10 Pain Score: 9  Pain Location: bilat distal LE Pain Descriptors / Indicators: Sharp;Sore;Guarding;Grimacing;Tightness Pain Intervention(s): Monitored during session;Limited activity within patient's tolerance;Repositioned         Frequency  Min 2X/week        Progress Toward Goals  OT Goals(current goals can now be found in the care plan section)  Progress towards OT goals: Progressing toward goals  Acute Rehab OT Goals Patient Stated Goal: less pain  Plan Equipment recommendations need to be updated       AM-PAC PT "6 Clicks" Daily Activity     Outcome Measure   Help from another person eating meals?: None Help from another person taking care of personal grooming?: A Little Help from  another person toileting, which includes using toliet, bedpan, or urinal?: A Lot Help from another person bathing (including washing, rinsing, drying)?: A Lot Help from another person to put on and taking off regular upper body clothing?: A Little Help from another person to put on and taking off regular lower body clothing?: A Lot 6 Click Score: 16    End of Session Equipment Utilized During Treatment: Rolling walker  OT Visit Diagnosis: Muscle weakness (generalized) (M62.81);Other abnormalities of gait and mobility (R26.89);Pain Pain - part of body: (bil calfs and left hip)   Activity Tolerance Patient tolerated treatment well   Patient Left in bed;with call bell/phone within reach;with nursing/sitter in room           Time: 1231-1304 OT Time Calculation (min): 33 min  Charges: OT General Charges $OT Visit: 1 Visit OT Treatments $Self Care/Home Management : 23-37 mins  Ignacia PalmaCathy Kalia Vahey, OTR/L [phone] 11/12/2017

## 2017-11-12 NOTE — Progress Notes (Signed)
Patient was bladder scanned and maximum amount of 550 cc showed on the scanner.  Patient was assisted to the bedside commode and is able to urinate and had a bowel movement at the same time. Patient safely assisted back to her bed. We will continue to monitor.

## 2017-11-12 NOTE — Discharge Summary (Signed)
Discharge Summary  [name] ZOX:[id number] DOB: 12/29/1965  PCP: Dois Davenport, MD  Admit date: 11/04/2017 Discharge date: 11/12/2017  Time spent: >56mins, more than 50% time spent on coordination of care  Recommendations for Outpatient Follow-up:  1. F/u with PMD within a week  for hospital discharge follow up, repeat cbc/bmp at follow up 2. F/u with wound care on 3/19  3. F/u with nephrology 4. F/u with orthopedics 5. Home health arranged, patient's husband and son reports they can provide 24/7 care at home.  Discharge Diagnoses:  Active Hospital Problems   Diagnosis Date Noted  . Localized swelling of lower extremity 11/05/2017  . Anemia of chronic disease 01/26/2017  . Uncontrolled type 1 diabetes mellitus with foot ulcer (HCC) 06/04/2015  . Essential hypertension 06/01/2015  . H/O kidney transplant 08/04/2012  . Hypothyroidism   . Immunosuppression [facility])     Resolved Hospital Problems  No resolved problems to display.    Discharge Condition: stable  Diet recommendation: heart healthy/carb modified  Filed Weights   11/05/17 0235 11/06/17 0504  Weight: 82.4 kg (181 lb 9.6 oz) 84.3 kg (185 lb 13.6 oz)    History of present illness: ( per admitting MD Dr Katrinka Blazing) PCP: Dois Davenport, MD  Patient coming from: Home via EMS  Chief Complaint: Lower extremity swelling and pain  I have personally briefly reviewed patient's old medical records in [facility]   HPI: [name] is a 52 y.o. female with medical history significant of HTN, hypothyroidism, s/p renal transplant on chronic immunosuppressive therapy, DM type I, depression, history of DVT not currently on anticoagulation; who presented with complaints of worsening bilateral lower extremity pain swelling and redness over the last several days.  She reports having chronic lower extremity edema that fluctuates, but noticed over the last 3-4 days symptoms progressively worsened.  Notes  developing redness and increased warmth of both lower legs, but worse on the right.  Tried taking Tylenol without relief of pain.  Pain was so severe that she was unable to ambulate and felt that she may fall if she tried to stand up.  Denies having any recent trauma or scrapes to the leg, chest pain, fever, black/bloody stools, or dysuria.  She also complained of persistently elevated blood sugars into the 300s.   ED Course: Upon admission to the emergency department patient was noted to have stable vital signs.  Labs revealed WBC 7, hemoglobin 9.6, platelets 129, BUN 50, creatinine 2.63, glucose 363,  and lactic acid 1.53.  Patient was given 500 mL of normal saline IV fluid and Rocephin.  TRH called to admit.    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  Orthopedic consulted recommended MRI of the hip which did not show septic arthritis. Although her fracture has not completely healed, ortho recommended exchange one of her screws once infection treated --Blood culture negative -she has been on IV cefepime and oral Zyvox , I have discussed case with infectious disease Dr. Ninetta Lights, abx  changed to doxycycline to cover cellulitis. -she is discharged on doxycycline and wound care clinic follow up.  Acute renal failure on chronic kidney disease Stage 3, s/prenal transplant on chronic immunosuppressive therapy Nephrology consulted, input appreciated -She is to follow with nephrology outpatient  Metabolic acidosis Continue bicarb supplement  Insulin-dependent diabetes She is also on chronic steroid a1c  8.9 She did have hypoglycemic episode in the hospital Adjust insulin as needed  Hypertension She does not appear to require high-dose blood pressure medication DC norvasc and hydralazine,  She is discharged on decreased dose of  coreg 25mg  bid instead of 50mg  bid. pmd to close monitor blood pressure, continue to adjust bp meds at hospital discharge follow up.  Chronic hypoxic respiratory failure on home oxygen Stable, chest x-ray no acute findings Per chart review she is supposed to be on home o2, but She denies that she is on home oxygen. Wean oxygen  Failure to thrive Home with home health and 24/7 supervision with family.   Poor IV access This is being limited by h/o bilateral av fistula/avg placement  Code Status: full  Family Communication: patient   Disposition Plan: patient wants to go home instead of snf Home health arranged.  Consultants:  Infectious disease  Orthopedic  Nephrology  Procedures:  None  Antibiotics:  As above    Discharge Exam: BP (!) 120/52 (BP Location: Right Arm)   Pulse 66   Temp 97.9 F (36.6 C) (Oral)   Resp 18   Ht 5\' 7"  (1.702 m)   Wt 84.3 kg (185 lb 13.6 oz)   LMP 11/19/2011   SpO2 93%   BMI 29.11 kg/m   General: chronically ill appearing, NAD, aaox3 Cardiovascular: RRR Respiratory: CTABL Extremities:Lower extremity edema is improving skin start to wrinkle , chronic venous stasis changes      Discharge Instructions You were cared for by a hospitalist during your hospital stay. If you have any questions about your discharge medications or the care you received while you were in the hospital after you are discharged, you can call the unit and asked to speak with the hospitalist on call if the hospitalist that took care of you is not available. Once you are discharged, your primary care physician will handle any further medical issues. Please note that NO REFILLS for any discharge medications will be  authorized once you are discharged, as it is imperative that you return to your primary care physician (or establish a relationship with a primary care physician if you do not have one) for your aftercare needs so that they can reassess your need for medications and monitor your lab values.  Discharge Instructions    Diet - low sodium heart healthy   Complete by:  As directed    Carb modified diet   Increase activity slowly   Complete by:  As directed      Allergies as of 11/12/2017      Reactions   Latex Itching   Ace Inhibitors Cough   Adhesive [tape] Itching, Other (See Comments)   Please use paper tape   Lisinopril Cough      Medication List    STOP taking these medications   amLODipine 10 MG tablet Commonly known as:  NORVASC   hydrALAZINE 25 MG tablet Commonly known as:  APRESOLINE     TAKE these medications   acetaminophen 325 MG tablet Commonly known as:  TYLENOL Take 1 tablet (325 mg total) by mouth every 6 (six) hours as needed for mild pain, moderate pain or headache. What changed:    how much to take  reasons  to take this   carvedilol 25 MG tablet Commonly known as:  COREG Take 1 tablet (25 mg total) by mouth 2 (two) times daily with a meal. What changed:    how much to take  when to take this   doxycycline 100 MG tablet Commonly known as:  VIBRA-TABS Take 1 tablet (100 mg total) by mouth every 12 (twelve) hours for 4 days.   FREESTYLE LIBRE READER Devi 1 each by Does not apply route 4 (four) times daily.   FREESTYLE LIBRE SENSOR SYSTEM Misc 1 sensor every 10 days   HYDROcodone-acetaminophen 5-325 MG tablet Commonly known as:  NORCO Take 0.5-1 tablets by mouth every 4 (four) hours as needed for moderate pain or severe pain.   lip balm Oint Apply 1 application topically as needed for lip care.   mycophenolate 250 MG capsule Commonly known as:  CELLCEPT Take 500 mg by mouth 2 (two) times daily.   NOVOLIN 70/30 FLEXPEN RELION (70-30) 100  UNIT/ML PEN Generic drug:  Insulin Isophane & Regular Human Inject 20 Units into the skin 2 (two) times daily before a meal. PER SLIDING SCALE   nystatin powder Commonly known as:  MYCOSTATIN/NYSTOP Apply topically 3 (three) times daily.   predniSONE 5 MG tablet Commonly known as:  DELTASONE Take 5 mg by mouth daily.   senna-docusate 8.6-50 MG tablet Commonly known as:  Senokot-S Take 1 tablet by mouth at bedtime.   sodium bicarbonate 650 MG tablet Take 2 tablets (1,300 mg total) by mouth 2 (two) times daily.   SYNTHROID 137 MCG tablet Generic drug:  levothyroxine Take 137 mcg by mouth daily.   tacrolimus 1 MG capsule Commonly known as:  PROGRAF Take 1 capsule (1 mg total) by mouth daily before breakfast. What changed:    how much to take  additional instructions   vitamin B-12 100 MCG tablet Commonly known as:  CYANOCOBALAMIN Take 1 tablet (100 mcg total) by mouth daily.      Allergies  Allergen Reactions  . Latex Itching  . Ace Inhibitors Cough  . Adhesive [Tape] Itching and Other (See Comments)    Please use paper tape  . Lisinopril Cough   Follow-up Information    Swinteck, Arlys John, MD Follow up in 2 week(s).   Specialty:  Orthopedic Surgery Contact information: [address] [place] [address] [phone]        Terrial Rhodes, MD Follow up in 2 week(s).   Specialty:  Nephrology Why:  ckd Contact information: [address] [place] [address] [phone]        Dois Davenport, MD Follow up in 1 week(s).   Specialty:  Family Medicine Why:  hospital discharge follow up, repeat cbc/bmp at follow up, pmd to continue adjust blood pressure meds Contact information: [address] [place] [address] [phone]        [facility]             . Go on 11/23/2017.   Why:  for bilateral lower extremity wound care        Follow up appointment 11/23/17 at 8am Contact  information: [address] [place] [address] [phone]       [facility] Follow up.   Specialty:  Home Health Services Why:  [facility] nursing,physicaltherapy/occupational therapy/aide/social Advice worker information: [address] [place] [address] [phone]  The results of significant diagnostics from this hospitalization (including imaging, microbiology, ancillary and laboratory) are listed below for reference.    Significant Diagnostic Studies: Ct Abdomen Pelvis Wo Contrast  Result Date: 11/05/2017 CLINICAL DATA:  BILATERAL flank and back pain question kidney stone, history of renal transplant, diabetes mellitus, former smoker EXAM: CT ABDOMEN AND PELVIS WITHOUT CONTRAST TECHNIQUE: Multidetector CT imaging of the abdomen and pelvis was performed following the standard protocol without IV contrast. Sagittal and coronal MPR images reconstructed from axial data set. COMPARISON:  04/10/2016 FINDINGS: Lower chest: Subsegmental atelectasis at LEFT base. Hepatobiliary: Focal fatty infiltration of the liver. Otherwise normal appearance of liver and gallbladder. Pancreas: Atrophic, grossly unremarkable Spleen: Normal appearance Adrenals/Urinary Tract: Thickening of adrenal glands without discrete mass. Markedly atrophic native kidneys. Transplant kidney RIGHT iliac fossa without gross mass or hydronephrosis. Transplant ureter and bladder unremarkable. Stomach/Bowel: Stomach and bowel loops unremarkable appearance for technique. Vascular/Lymphatic: Extensive atherosclerotic calcifications of both abdominal aorta and multiple branch vessels. Aorta normal caliber. No adenopathy. Reproductive: Normal appearing uterus and adnexa Other: No free air or free fluid. Diffuse soft tissue edema both of abdominal wall and intra-abdominal planes question related to fluid overload or hypoproteinemia. Tiny umbilical hernia containing fat.  Musculoskeletal: Diffusely demineralized. Degenerative disc disease changes lumbar spine. Cannulated screws at proximal RIGHT femur. IMPRESSION: No acute intra-abdominal or intrapelvic abnormalities. Atrophic native kidneys with unremarkable appearing transplant kidney in RIGHT iliac fossa. Scattered soft tissue edema question related to fluid overload or hypoproteinemia. Electronically Signed   By: Ulyses Southward M.D.   On: 11/05/2017 16:28   US Renal  Result Date: 11/05/2017 CLINICAL DATA:  Acute kidney injury, transplanted kidney EXAM: ULTRASOUND OF RENAL TRANSPLANT TECHNIQUE: Ultrasound examination of the renal transplant was performed with gray-scale and color Doppler evaluation. COMPARISON:  CT 11/05/2017, ultrasound 06/05/2015 FINDINGS: Transplant kidney location:  Right lower quadrant Transplant kidney description: Length: 12 cm. Within normal limits in parenchymal echogenicity. No evidence of mass or hydronephrosis. No peri-transplant fluid collection seen. Color flow in the main renal artery at the hilum:  Visualized Color flow in the main renal vein at the hilum:  Visualized Bladder: Foley catheter in the bladder. Other findings: Atrophic echogenic native right kidney. Poorly visible native left kidney. IMPRESSION: Negative ultrasound appearance of the right lower quadrant transplant kidney. Electronically Signed   By: Jasmine Pang M.D.   On: 11/05/2017 21:35   Mr Hip Right Wo Contrast  Result Date: 11/08/2017 CLINICAL DATA:  Bilateral lower extremity pain, swelling and erythema for several days. Patient reports chronic lower extremity edema. History of chronic kidney disease, diabetes, renal transplant and hypertension. EXAM: MR OF THE RIGHT HIP WITHOUT CONTRAST TECHNIQUE: Multiplanar, multisequence MR imaging was performed. No intravenous contrast was administered. COMPARISON:  Lower extremity CT 11/07/2017.  Pelvic CT 11/05/2017. FINDINGS: Bones: Status post fixation of the right femoral neck with  3 cortical screws. The hardware is in stable position. No displaced fracture. There is ill-defined low T1 and high T2 signal in the left superior pubic ramus, best seen on coronal images 17 and 18, likely a nondisplaced fracture. The inferior pubic ramus appears intact. The sacrum and sacroiliac joints appear intact. There are mild degenerative changes of both hips with small bilateral hip joint effusions. No evidence of femoral head avascular necrosis. Articular cartilage and labrum Articular cartilage:  No focal chondral defect. Labrum:  No gross labral tear or paralabral cyst. Joint or bursal effusion Joint effusion:  Small bilateral hip joint effusions. Bursae: No focal  bursal fluid collection. Muscles and tendons Muscles and tendons: Generalized edema throughout the visualized pelvic and proximal thigh musculature. No focal intramuscular fluid collection or acute tendon findings identified. Other findings Miscellaneous: As on previous CT, generalized anasarca with diffuse edema throughout the subcutaneous and pelvic fat. Small amount of pelvic ascites. Transplant kidney in the right iliac fossa appears unchanged. IMPRESSION: 1. Nonspecific generalized soft tissue edema involving the musculature and subcutaneous and deep fat, consistent with nonspecific anasarca. Ascites without focal fluid collection. 2. Previous right hip pinning without displaced fracture. Marrow edema in the left superior pubic ramus suspicious for a nondisplaced fracture. The pelvis otherwise appears intact. Electronically Signed   By: Carey Bullocks M.D.   On: 11/08/2017 17:18   Nm Pulmonary Perf And Vent  Result Date: 11/05/2017 CLINICAL DATA:  Elevated D-dimer, DVT in BILATERAL lower extremities, smoker EXAM: NUCLEAR MEDICINE VENTILATION - PERFUSION LUNG SCAN TECHNIQUE: Ventilation images were obtained in multiple projections using inhaled aerosol Tc-41m DTPA. Perfusion images were obtained in multiple projections after intravenous  injection of Tc-67m-MAA. RADIOPHARMACEUTICALS:  32.4 mCi of Tc-13m DTPA aerosol inhalation and 4.36 mCi Tc22m-MAA IV COMPARISON:  None. FINDINGS: Ventilation: Central airway deposition of aerosol. Slightly diminished diffuse peripheral distribution of aerosol. No definite focal ventilation defects. Perfusion: Normal IMPRESSION: Normal perfusion lung scan. Electronically Signed   By: Ulyses Southward M.D.   On: 11/05/2017 15:53   Dg Chest Port 1 View  Result Date: 11/06/2017 CLINICAL DATA:  52 year old female admitted with progressive lower extremity edema, pain and erythema. Acute on chronic renal failure status post renal transplant. EXAM: PORTABLE CHEST 1 VIEW COMPARISON:  CT Abdomen and Pelvis 11/05/2017. 09/01/2017 and earlier. FINDINGS: Portable AP semi upright view at 0436 hours. Mild elevation of the right hemidiaphragm. Allowing for portable technique the lungs are clear. Mediastinal contours remain normal. Visualized tracheal air column is within normal limits. No acute osseous abnormality identified. IMPRESSION: No acute cardiopulmonary abnormality. Electronically Signed   By: Odessa Fleming M.D.   On: 11/06/2017 06:41   Ct Renal Stone Study  Result Date: 11/11/2017 CLINICAL DATA:  Renal failure and metabolic acidosis EXAM: CT ABDOMEN AND PELVIS WITHOUT CONTRAST TECHNIQUE: Multidetector CT imaging of the abdomen and pelvis was performed following the standard protocol without IV contrast. COMPARISON:  11/05/2017 FINDINGS: Lower chest: Small bilateral pleural effusions are noted which are new from the prior exam. Left lower lobe mild infiltrate is noted as well new from the prior exam. Hepatobiliary: Focal fatty infiltration of the liver is noted along the falciform ligament. The gallbladder is well distended. No other focal abnormality is noted. Pancreas: Unremarkable. No pancreatic ductal dilatation or surrounding inflammatory changes. Spleen: Normal in size without focal abnormality. Adrenals/Urinary Tract:  Adrenal glands are within normal limits. The native kidneys are again shrunken with diffuse renal vascular calcifications. A transplant kidney is noted in the right lower quadrant without mass lesion or hydronephrosis. No renal calculi are seen. The bladder is well distended. Few small foci of air are noted within the bladder which may be related to recent instrumentation. Clinical correlation is recommended. Stomach/Bowel: No obstructive or inflammatory changes are noted. The appendix is within normal limits. Vascular/Lymphatic: Aortic atherosclerosis. No enlarged abdominal or pelvic lymph nodes. Reproductive: Uterus and bilateral adnexa are unremarkable. Other: Minimal free fluid is noted within the pelvis. Changes of edema are noted within the subcutaneous tissues. Small fat containing umbilical hernia is noted. Musculoskeletal: Postsurgical changes are noted in the right hip. Degenerative changes of lumbar spine are  noted. IMPRESSION: Atrophic native kidneys with normal appearing transplant kidney in the right iliac fossa. Changes consistent with subcutaneous edema No acute abnormality is noted within the abdomen and pelvis. The overall appearance is stable from the prior exam. Electronically Signed   By: Alcide Clever M.D.   On: 11/11/2017 11:12   Dg Hip Unilat With Pelvis 1v Right  Result Date: 11/09/2017 CLINICAL DATA:  History of right femoral neck fracture status post ORIF. Patient reports right hip pain. EXAM: DG HIP (WITH OR WITHOUT PELVIS) 1V RIGHT COMPARISON:  06/02/2017 right hip radiographs. 11/07/2017 bilateral lower extremity CT. FINDINGS: Nearly healed fracture of the right femoral neck in anatomic alignment status post transfixation by 3 screws, with no evidence of screw fracture or loosening. No additional fracture. No right hip dislocation. No significant right hip arthropathy. Degenerative changes in the visualized lower lumbar spine. IMPRESSION: Nearly healed right femoral neck fracture in  anatomic alignment status post ORIF, with no evidence of hardware complication. Electronically Signed   By: Delbert Phenix M.D.   On: 11/09/2017 20:12   Ct Extrem Lower Wo Cm Bil  Result Date: 11/07/2017 CLINICAL DATA:  Worsening bilateral lower extremity pain swelling and redness over the last several days. She reports having chronic lower extremity edema that fluctuates, but noticed over the last 3-4 days symptoms progressively worsened. EXAM: CT OF THE LOWER BILATERAL EXTREMITY WITHOUT CONTRAST TECHNIQUE: Multidetector CT imaging of the lower bilateral extremity was performed according to the standard protocol. COMPARISON:  None. FINDINGS: Bones/Joint/Cartilage Generalized osteopenia. Nondisplaced fracture of the medial margin of the right femoral head neck transfixed with 3 cannulated screws. Mild osteoarthritis of the right hip. Normal alignment. No joint effusion. Knee joint spaces are maintained bilaterally. Ankle mortise is normal bilaterally. Subtalar joints are normal bilaterally. Mild osteoarthritis of bilateral sacroiliac joints. No aggressive osseous lesion. Severe degenerative disc disease with disc height loss at L4-5 and L5-S1 with severe bilateral facet arthropathy and foraminal stenosis. Ligaments Ligaments are suboptimally evaluated by CT. Muscles and Tendons Mild generalized symmetric muscle atrophy bilaterally. No intramuscular fluid collection or hematoma. No intramuscular emphysema. Soft tissue Mild soft tissue edema throughout bilateral lower extremities and pelvis most consistent with anasarca. Peripheral vascular atherosclerotic disease. 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 Foley catheter within the bladder. No soft tissue mass. IMPRESSION: 1. Mild soft tissue edema throughout bilateral lower extremities and moderate soft tissue edema in the subcutaneous fat of the pelvis and lower abdomen as can be seen with anasarca. 2. Extensive peripheral vascular atherosclerotic disease. 3.  Nondisplaced fracture of the medial margin of the right femoral head neck transfixed with 3 cannulated screws. Electronically Signed   By: Elige Ko   On: 11/07/2017 10:06    Microbiology: Recent Results (from the past 240 hour(s))  MRSA PCR Screening     Status: Abnormal   Collection Time: 11/05/17  3:19 AM  Result Value Ref Range Status   MRSA by PCR POSITIVE (A) NEGATIVE Final    Comment:        The GeneXpert MRSA Assay (FDA approved for NASAL specimens only), is one component of a comprehensive MRSA colonization surveillance program. It is not intended to diagnose MRSA infection nor to guide or monitor treatment for MRSA infections. RESULT CALLED TO, READ BACK BY AND VERIFIED WITH: HOLT,B @ 1514 ON [id number] BY POTEAT,S Performed at [facility], [address], [place], [place] [address]   Culture, blood (Routine X 2) w Reflex to ID Panel  Status: None (Preliminary result)   Collection Time: 11/07/17  2:59 PM  Result Value Ref Range Status   Specimen Description   Final    LEFT ANTECUBITAL Performed at [facility], [address] [place], [place] [address]    Special Requests   Final    IN PEDIATRIC BOTTLE Blood Culture adequate volume Performed at [facility], [address], [place], [place] [address]    Culture   Final    NO GROWTH 3 DAYS Performed at [facility], [address], [place], [place] [address]    Report Status PENDING  Incomplete  Culture, blood (single)     Status: None (Preliminary result)   Collection Time: 11/07/17  3:12 PM  Result Value Ref Range Status   Specimen Description   Final    BLOOD LEFT HAND Performed at [facility], [address], [place], [place] [address]    Special Requests   Final    BOTTLES DRAWN AEROBIC ONLY Blood Culture adequate volume Performed at [facility], [address] [place], [place] [address]     Culture   Final    NO GROWTH 3 DAYS Performed at [facility], [address] [place], [place] [address]    Report Status PENDING  Incomplete     Labs: Basic Metabolic Panel: Recent Labs  Lab 11/06/17 0515 11/07/17 0531 11/08/17 0534 11/09/17 0530 11/10/17 0528 11/11/17 0501 11/12/17 0507  NA 140 137  138 138  140 137 134* 132* 132*  K 3.9 3.4*  3.4* 4.0  4.0 3.7 4.8 4.5 5.1  CL 106 100*  100* 101  103 102 101 101 101  CO2 24 27  28 27  28 28 22 22 22   GLUCOSE 120* 151*  152* 271*  276* 73 239* 26* 201*  BUN 58* 51*  52* 50*  52* 45* 48* 49* 49*  CREATININE 2.42* 2.14*  2.12* 2.15*  2.14* 1.93* 2.10* 2.32* 2.42*  CALCIUM 8.6* 8.3*  8.3* 8.6*  8.7* 8.7* 8.5* 8.6* 8.2*  MG 1.9 1.8 1.9 1.8 1.9  --   --   PHOS 3.8 2.5  2.6 2.7  2.7 2.1* 3.1  --   --    Liver Function Tests: Recent Labs  Lab 11/06/17 0515 11/07/17 0531 11/08/17 0534 11/09/17 0530 11/10/17 0528  AST 12* 13* 12* 13* 11*  ALT 9* 8* 9* 9* 9*  ALKPHOS 79 83 97 90 98  BILITOT 0.5 0.5 0.9 0.5 0.9  PROT 5.0* 5.0* 5.2* 5.3* 5.2*  ALBUMIN 1.7* 1.6*  1.5* 1.6*  1.6* 1.7* 1.6*   No results for input(s): LIPASE, AMYLASE in the last 168 hours. No results for input(s): AMMONIA in the last 168 hours. CBC: Recent Labs  Lab 11/07/17 0531 11/07/17 1603 11/08/17 0534 11/09/17 0530 11/10/17 0528 11/12/17 0507  WBC 7.4  --  6.0 6.9 7.8 9.7  NEUTROABS 6.1  --  4.6 5.3 6.3 8.1*  HGB 9.0*  --  8.8* 9.2* 8.9* 9.2*  HCT 28.8*  --  28.6* 30.0* 29.5* 29.5*  MCV 91.1  --  93.2 91.2 94.9 91.9  PLT 125* 159 153 210 246 325   Cardiac Enzymes: No results for input(s): CKTOTAL, CKMB, CKMBINDEX, TROPONINI in the last 168 hours. BNP: BNP (last 3 results) Recent Labs    04/06/17 1941 05/09/17 0853 08/31/17 1535  BNP 137.1* 282.1* 1,022.8*    ProBNP (last 3 results) No results for  input(s): PROBNP in the last 8760 hours.  CBG: Recent Labs  Lab 11/11/17 1645 11/11/17 2119 11/12/17 0202  11/12/17 0746 11/12/17 1202  GLUCAP 206* 210* 208* 214* 194*       Signed:  Albertine GratesFang Lela Gell MD, PhD  [facility] 11/12/2017, 12:59 PM

## 2017-11-12 NOTE — Progress Notes (Signed)
CSW followed up with patient's husband [name]([name] [phone]), patient's husband reported that they are interested in new SNF and reported that they [facility]. CSW agreed to follow up with SNF.  CSW spoke with admissions staff from [facility], staff reported no bed availability.  CSW provided patient's husband with update. Patient's husband reported that their second choice is [facility].  CSW [facility] with [facility] staff [facility] regarding patient's referral, staff reported that they do not have any beds available.  CSW provided update to patient's husband, patient's husband that [facility]. CSW agreed to follow up with [facility].  CSW contacted [facility], no answer. CSW left voicemail requesting return phone call.  CSW contacted by patient's husband and informed that patient now wants to dc home with home health services. Patient's husband reported that he and patient's son will be with patient 24 hours a day. Patient's husband reported that they would like [facility] and that patient needed PTAR to get home. CSW agreed to notify patient's RNCM.  CSW informed patient's RNCM that patient is interested in Home with Home Health Services and need for PTAR for transport home. RNCM following to assist with home health needs.   CSW signing off, no other needs identified at this time.  [name] SickleKimberly Darnesha [name], [place] Clinical Social Worker [name] Cell#: [phone]

## 2017-11-12 NOTE — Progress Notes (Signed)
Spoke to OT-recc 3n1-informed spouse by tel phone-not covered by insurance-can pick  1 up @ [facility]/bed [facility] bath-spouse voiced understanding.

## 2017-11-13 LAB — CULTURE, BLOOD (SINGLE)
CULTURE: NO GROWTH
SPECIAL REQUESTS: ADEQUATE

## 2017-11-13 LAB — CULTURE, BLOOD (ROUTINE X 2)
CULTURE: NO GROWTH
SPECIAL REQUESTS: ADEQUATE

## 2017-11-14 ENCOUNTER — Encounter (HOSPITAL_COMMUNITY): Payer: Self-pay | Admitting: *Deleted

## 2017-11-14 ENCOUNTER — Emergency Department (HOSPITAL_COMMUNITY): Payer: Medicare Other

## 2017-11-14 ENCOUNTER — Inpatient Hospital Stay (HOSPITAL_COMMUNITY)
Admission: EM | Admit: 2017-11-14 | Discharge: 2017-11-19 | DRG: 193 | Disposition: A | Discharge status: Skilled Nursing Facility | Payer: Medicare Other | Attending: Internal Medicine | Admitting: Internal Medicine

## 2017-11-14 ENCOUNTER — Other Ambulatory Visit: Payer: Self-pay

## 2017-11-14 ENCOUNTER — Inpatient Hospital Stay (HOSPITAL_COMMUNITY): Payer: Medicare Other

## 2017-11-14 DIAGNOSIS — I5033 Acute on chronic diastolic (congestive) heart failure: Secondary | ICD-10-CM | POA: Diagnosis present

## 2017-11-14 DIAGNOSIS — D899 Disorder involving the immune mechanism, unspecified: Secondary | ICD-10-CM

## 2017-11-14 DIAGNOSIS — K219 Gastro-esophageal reflux disease without esophagitis: Secondary | ICD-10-CM | POA: Diagnosis present

## 2017-11-14 DIAGNOSIS — R51 Headache: Secondary | ICD-10-CM | POA: Diagnosis present

## 2017-11-14 DIAGNOSIS — E119 Type 2 diabetes mellitus without complications: Secondary | ICD-10-CM

## 2017-11-14 DIAGNOSIS — Z9889 Other specified postprocedural states: Secondary | ICD-10-CM

## 2017-11-14 DIAGNOSIS — L03115 Cellulitis of right lower limb: Secondary | ICD-10-CM | POA: Diagnosis present

## 2017-11-14 DIAGNOSIS — M1990 Unspecified osteoarthritis, unspecified site: Secondary | ICD-10-CM | POA: Diagnosis present

## 2017-11-14 DIAGNOSIS — R0600 Dyspnea, unspecified: Secondary | ICD-10-CM | POA: Diagnosis present

## 2017-11-14 DIAGNOSIS — Z888 Allergy status to other drugs, medicaments and biological substances status: Secondary | ICD-10-CM

## 2017-11-14 DIAGNOSIS — J90 Pleural effusion, not elsewhere classified: Secondary | ICD-10-CM | POA: Diagnosis present

## 2017-11-14 DIAGNOSIS — E1165 Type 2 diabetes mellitus with hyperglycemia: Secondary | ICD-10-CM | POA: Diagnosis present

## 2017-11-14 DIAGNOSIS — Z794 Long term (current) use of insulin: Secondary | ICD-10-CM

## 2017-11-14 DIAGNOSIS — E11628 Type 2 diabetes mellitus with other skin complications: Secondary | ICD-10-CM | POA: Diagnosis present

## 2017-11-14 DIAGNOSIS — L03116 Cellulitis of left lower limb: Secondary | ICD-10-CM | POA: Diagnosis present

## 2017-11-14 DIAGNOSIS — D638 Anemia in other chronic diseases classified elsewhere: Secondary | ICD-10-CM | POA: Diagnosis present

## 2017-11-14 DIAGNOSIS — N183 Chronic kidney disease, stage 3 unspecified: Secondary | ICD-10-CM | POA: Diagnosis present

## 2017-11-14 DIAGNOSIS — Z940 Kidney transplant status: Secondary | ICD-10-CM

## 2017-11-14 DIAGNOSIS — R0902 Hypoxemia: Secondary | ICD-10-CM | POA: Diagnosis not present

## 2017-11-14 DIAGNOSIS — I10 Essential (primary) hypertension: Secondary | ICD-10-CM | POA: Diagnosis present

## 2017-11-14 DIAGNOSIS — I130 Hypertensive heart and chronic kidney disease with heart failure and stage 1 through stage 4 chronic kidney disease, or unspecified chronic kidney disease: Secondary | ICD-10-CM | POA: Diagnosis present

## 2017-11-14 DIAGNOSIS — Y95 Nosocomial condition: Secondary | ICD-10-CM | POA: Diagnosis present

## 2017-11-14 DIAGNOSIS — Z7952 Long term (current) use of systemic steroids: Secondary | ICD-10-CM

## 2017-11-14 DIAGNOSIS — R197 Diarrhea, unspecified: Secondary | ICD-10-CM | POA: Diagnosis not present

## 2017-11-14 DIAGNOSIS — D631 Anemia in chronic kidney disease: Secondary | ICD-10-CM | POA: Diagnosis present

## 2017-11-14 DIAGNOSIS — K521 Toxic gastroenteritis and colitis: Secondary | ICD-10-CM | POA: Diagnosis present

## 2017-11-14 DIAGNOSIS — Z91040 Latex allergy status: Secondary | ICD-10-CM

## 2017-11-14 DIAGNOSIS — N179 Acute kidney failure, unspecified: Secondary | ICD-10-CM | POA: Diagnosis not present

## 2017-11-14 DIAGNOSIS — D849 Immunodeficiency, unspecified: Secondary | ICD-10-CM | POA: Diagnosis present

## 2017-11-14 DIAGNOSIS — Z89421 Acquired absence of other right toe(s): Secondary | ICD-10-CM

## 2017-11-14 DIAGNOSIS — E1122 Type 2 diabetes mellitus with diabetic chronic kidney disease: Secondary | ICD-10-CM | POA: Diagnosis present

## 2017-11-14 DIAGNOSIS — Z87891 Personal history of nicotine dependence: Secondary | ICD-10-CM

## 2017-11-14 DIAGNOSIS — I509 Heart failure, unspecified: Secondary | ICD-10-CM | POA: Diagnosis not present

## 2017-11-14 DIAGNOSIS — R7989 Other specified abnormal findings of blood chemistry: Secondary | ICD-10-CM | POA: Diagnosis present

## 2017-11-14 DIAGNOSIS — R402413 Glasgow coma scale score 13-15, at hospital admission: Secondary | ICD-10-CM | POA: Diagnosis present

## 2017-11-14 DIAGNOSIS — T3695XA Adverse effect of unspecified systemic antibiotic, initial encounter: Secondary | ICD-10-CM | POA: Diagnosis present

## 2017-11-14 DIAGNOSIS — Z89411 Acquired absence of right great toe: Secondary | ICD-10-CM

## 2017-11-14 DIAGNOSIS — Z79899 Other long term (current) drug therapy: Secondary | ICD-10-CM

## 2017-11-14 DIAGNOSIS — Z91048 Other nonmedicinal substance allergy status: Secondary | ICD-10-CM

## 2017-11-14 DIAGNOSIS — J9601 Acute respiratory failure with hypoxia: Secondary | ICD-10-CM | POA: Diagnosis present

## 2017-11-14 DIAGNOSIS — E039 Hypothyroidism, unspecified: Secondary | ICD-10-CM | POA: Diagnosis present

## 2017-11-14 DIAGNOSIS — L089 Local infection of the skin and subcutaneous tissue, unspecified: Secondary | ICD-10-CM | POA: Diagnosis present

## 2017-11-14 DIAGNOSIS — Z89412 Acquired absence of left great toe: Secondary | ICD-10-CM

## 2017-11-14 DIAGNOSIS — I340 Nonrheumatic mitral (valve) insufficiency: Secondary | ICD-10-CM | POA: Diagnosis not present

## 2017-11-14 DIAGNOSIS — IMO0001 Reserved for inherently not codable concepts without codable children: Secondary | ICD-10-CM

## 2017-11-14 DIAGNOSIS — J189 Pneumonia, unspecified organism: Principal | ICD-10-CM | POA: Diagnosis present

## 2017-11-14 DIAGNOSIS — R69 Illness, unspecified: Secondary | ICD-10-CM

## 2017-11-14 LAB — BRAIN NATRIURETIC PEPTIDE: B Natriuretic Peptide: 958.8 pg/mL — ABNORMAL HIGH (ref 0.0–100.0)

## 2017-11-14 LAB — COMPREHENSIVE METABOLIC PANEL
ALBUMIN: 1.8 g/dL — AB (ref 3.5–5.0)
ALT: 11 U/L — ABNORMAL LOW (ref 14–54)
ANION GAP: 10 (ref 5–15)
AST: 16 U/L (ref 15–41)
Alkaline Phosphatase: 113 U/L (ref 38–126)
BUN: 43 mg/dL — ABNORMAL HIGH (ref 6–20)
CO2: 23 mmol/L (ref 22–32)
Calcium: 8.6 mg/dL — ABNORMAL LOW (ref 8.9–10.3)
Chloride: 103 mmol/L (ref 101–111)
Creatinine, Ser: 2.29 mg/dL — ABNORMAL HIGH (ref 0.44–1.00)
GFR calc Af Amer: 27 mL/min — ABNORMAL LOW (ref >60)
GFR calc non Af Amer: 24 mL/min — ABNORMAL LOW (ref >60)
GLUCOSE: 290 mg/dL — AB (ref 65–99)
POTASSIUM: 5 mmol/L (ref 3.5–5.1)
SODIUM: 136 mmol/L (ref 135–145)
TOTAL PROTEIN: 6.1 g/dL — AB (ref 6.5–8.1)
Total Bilirubin: 0.6 mg/dL (ref 0.3–1.2)

## 2017-11-14 LAB — CBC WITH DIFFERENTIAL/PLATELET
Basophils Absolute: 0 10*3/uL (ref 0.0–0.1)
Basophils Relative: 0 %
EOS PCT: 0 %
Eosinophils Absolute: 0 10*3/uL (ref 0.0–0.7)
HCT: 32.6 % — ABNORMAL LOW (ref 36.0–46.0)
Hemoglobin: 10.1 g/dL — ABNORMAL LOW (ref 12.0–15.0)
LYMPHS ABS: 0.6 10*3/uL — AB (ref 0.7–4.0)
Lymphocytes Relative: 6 %
MCH: 28.3 pg (ref 26.0–34.0)
MCHC: 31 g/dL (ref 30.0–36.0)
MCV: 91.3 fL (ref 78.0–100.0)
MONO ABS: 0.4 10*3/uL (ref 0.1–1.0)
Monocytes Relative: 4 %
NEUTROS ABS: 9 10*3/uL — AB (ref 1.7–7.7)
Neutrophils Relative %: 90 %
PLATELETS: 321 10*3/uL (ref 150–400)
RBC: 3.57 MIL/uL — AB (ref 3.87–5.11)
RDW: 14.8 % (ref 11.5–15.5)
WBC: 10 10*3/uL (ref 4.0–10.5)

## 2017-11-14 LAB — URINALYSIS, ROUTINE W REFLEX MICROSCOPIC
BILIRUBIN URINE: NEGATIVE
Glucose, UA: >=500 mg/dL — AB
KETONES UR: 5 mg/dL — AB
NITRITE: NEGATIVE
Protein, ur: 100 mg/dL — AB
Specific Gravity, Urine: 1.011 (ref 1.005–1.030)
pH: 6 (ref 5.0–8.0)

## 2017-11-14 LAB — CBG MONITORING, ED: Glucose-Capillary: 280 mg/dL — ABNORMAL HIGH (ref 65–99)

## 2017-11-14 LAB — I-STAT TROPONIN, ED: Troponin i, poc: 0.01 ng/mL (ref 0.00–0.08)

## 2017-11-14 LAB — GLUCOSE, CAPILLARY
Glucose-Capillary: 303 mg/dL — ABNORMAL HIGH (ref 65–99)
Glucose-Capillary: 321 mg/dL — ABNORMAL HIGH (ref 65–99)

## 2017-11-14 LAB — PROTIME-INR
INR: 0.95
Prothrombin Time: 12.6 seconds (ref 11.4–15.2)

## 2017-11-14 LAB — I-STAT CG4 LACTIC ACID, ED: Lactic Acid, Venous: 1.02 mmol/L (ref 0.5–1.9)

## 2017-11-14 MED ORDER — ACETAMINOPHEN 325 MG PO TABS: 325.0000 mg | ORAL_TABLET | Freq: Four times a day (QID) | ORAL | Status: DC | PRN

## 2017-11-14 MED ORDER — ENOXAPARIN SODIUM 30 MG/0.3ML ~~LOC~~ SOLN
30.0000 mg | SUBCUTANEOUS | Status: DC
  Administered 2017-11-14: 30 mg via SUBCUTANEOUS
  Filled 2017-11-14: qty 0.3

## 2017-11-14 MED ORDER — SODIUM BICARBONATE 650 MG PO TABS
1300.0000 mg | ORAL_TABLET | Freq: Two times a day (BID) | ORAL | Status: DC
  Administered 2017-11-14 – 2017-11-19 (×10): 1300 mg via ORAL
  Filled 2017-11-14 (×10): qty 2

## 2017-11-14 MED ORDER — LEVOTHYROXINE SODIUM 25 MCG PO TABS
137.0000 ug | ORAL_TABLET | Freq: Every day | ORAL | Status: DC
  Administered 2017-11-15 – 2017-11-19 (×5): 137 ug via ORAL
  Filled 2017-11-14 (×5): qty 1

## 2017-11-14 MED ORDER — SODIUM CHLORIDE 0.9 % IV SOLN
1.0000 g | Freq: Two times a day (BID) | INTRAVENOUS | Status: DC
  Administered 2017-11-14 – 2017-11-19 (×10): 1 g via INTRAVENOUS
  Filled 2017-11-14 (×12): qty 1

## 2017-11-14 MED ORDER — HYDROCODONE-ACETAMINOPHEN 5-325 MG PO TABS
1.0000 | ORAL_TABLET | Freq: Four times a day (QID) | ORAL | Status: DC | PRN
  Administered 2017-11-14 – 2017-11-19 (×8): 1 via ORAL
  Filled 2017-11-14 (×9): qty 1

## 2017-11-14 MED ORDER — TACROLIMUS 1 MG PO CAPS
1.0000 mg | ORAL_CAPSULE | Freq: Every day | ORAL | Status: DC
  Administered 2017-11-14 – 2017-11-18 (×5): 1 mg via ORAL
  Filled 2017-11-14 (×2): qty 2
  Filled 2017-11-14 (×3): qty 1
  Filled 2017-11-14: qty 2

## 2017-11-14 MED ORDER — NYSTATIN 100000 UNIT/GM EX POWD
Freq: Three times a day (TID) | CUTANEOUS | Status: DC
  Administered 2017-11-14 – 2017-11-17 (×9): via TOPICAL
  Administered 2017-11-17: 1 g via TOPICAL
  Administered 2017-11-18 – 2017-11-19 (×4): via TOPICAL
  Filled 2017-11-14: qty 15

## 2017-11-14 MED ORDER — MYCOPHENOLATE MOFETIL 250 MG PO CAPS
500.0000 mg | ORAL_CAPSULE | Freq: Two times a day (BID) | ORAL | Status: DC
  Administered 2017-11-14 – 2017-11-19 (×10): 500 mg via ORAL
  Filled 2017-11-14 (×11): qty 2

## 2017-11-14 MED ORDER — VITAMIN B-12 100 MCG PO TABS
100.0000 ug | ORAL_TABLET | Freq: Every day | ORAL | Status: DC
  Administered 2017-11-15 – 2017-11-19 (×5): 100 ug via ORAL
  Filled 2017-11-14 (×5): qty 1

## 2017-11-14 MED ORDER — INSULIN ISOPHANE & REGULAR (HUMAN 70-30)100 UNIT/ML KWIKPEN: 20.0000 [IU] | PEN_INJECTOR | Freq: Two times a day (BID) | SUBCUTANEOUS | Status: DC

## 2017-11-14 MED ORDER — VANCOMYCIN HCL 10 G IV SOLR
1750.0000 mg | INTRAVENOUS | Status: AC
  Administered 2017-11-14: 1750 mg via INTRAVENOUS
  Filled 2017-11-14: qty 1750

## 2017-11-14 MED ORDER — TACROLIMUS 0.5 MG PO CAPS
0.5000 mg | ORAL_CAPSULE | Freq: Every day | ORAL | Status: DC
  Administered 2017-11-15 – 2017-11-19 (×5): 0.5 mg via ORAL
  Filled 2017-11-14 (×5): qty 1

## 2017-11-14 MED ORDER — INSULIN ASPART PROT & ASPART (70-30 MIX) 100 UNIT/ML ~~LOC~~ SUSP
20.0000 [IU] | Freq: Two times a day (BID) | SUBCUTANEOUS | Status: DC
  Administered 2017-11-14 – 2017-11-15 (×2): 20 [IU] via SUBCUTANEOUS
  Filled 2017-11-14: qty 10

## 2017-11-14 MED ORDER — FUROSEMIDE 10 MG/ML IJ SOLN
40.0000 mg | Freq: Once | INTRAMUSCULAR | Status: AC
  Administered 2017-11-14: 40 mg via INTRAVENOUS
  Filled 2017-11-14: qty 4

## 2017-11-14 MED ORDER — CARVEDILOL 25 MG PO TABS
25.0000 mg | ORAL_TABLET | Freq: Two times a day (BID) | ORAL | Status: DC
  Administered 2017-11-14 – 2017-11-19 (×10): 25 mg via ORAL
  Filled 2017-11-14: qty 1
  Filled 2017-11-14: qty 2
  Filled 2017-11-14 (×7): qty 1
  Filled 2017-11-14: qty 2
  Filled 2017-11-14: qty 1
  Filled 2017-11-14 (×2): qty 2
  Filled 2017-11-14: qty 1

## 2017-11-14 MED ORDER — VANCOMYCIN HCL 10 G IV SOLR: 1250.0000 mg | INTRAVENOUS | Status: DC

## 2017-11-14 MED ORDER — PREDNISONE 5 MG PO TABS
5.0000 mg | ORAL_TABLET | Freq: Every day | ORAL | Status: DC
  Administered 2017-11-15 – 2017-11-19 (×5): 5 mg via ORAL
  Filled 2017-11-14 (×5): qty 1

## 2017-11-14 NOTE — ED Triage Notes (Signed)
Transported by [facility], recently hospitalized for bilateral leg pain and right hip pain. NRB applied d/t oxygen saturation reading of 78-80% upon arrival. CBG 330. A & O x 4.

## 2017-11-14 NOTE — H&P (Addendum)
History and Physical    [name] ZOX:[id number] DOB: [birth year] DOA: 11/14/2017  PCP: Dois Davenport, MD  Patient coming from: Home  I have personally briefly reviewed patient's old medical records in [facility] Link  Chief Complaint: Dyspnea   HPI: [name] is a 52 y.o. female with medical history significant of stage III CKD, status post renal transplant on immunosuppression, hypertension, hypothyroidism, type 2 diabetes mellitus insulin-dependent diabetes, GERD, recently admitted for cellulitis of the lower extremities and discharged on 8 March comes in for shortness of breath since discharge associated with left-sided chest pressure on deep inspiration and productive cough.  She denies any fevers or chills, any nausea or vomiting or abdominal pain.  She has some headache and loose bowel movements since discharge from the hospital.  She denies any syncopal episodes or dizziness.  On arrival to ED she was found to be profoundly hypoxic requiring nonrebreather to keep her oxygen sats greater than 90%, tachypneic.  Labs revealed a potassium of 5 creatinine of 2.29 BUN of 43, BNP of 958, point-of-care troponin negative, lactic acid of 1 hemoglobin of 10.1 and abnormal urinalysis with large leukocytes and negative nitrites with rare bacteria.  Chest x-ray showed left-sided pneumonia.  It was followed with a CT chest without contrast showing multifocal pneumonia and moderate left pleural effusion.  She was referred to [facility] for evaluation and management of healthcare associated pneumonia.  Review of Systems: All others reviewed and are negative.  Past Medical History:  Diagnosis Date  . Arthritis    "elbows, knees, legs, back" (09/24/2015)  . CKD (chronic kidney disease)   . Daily headache   . Depression    "years ago"  . End stage renal disease (HCC)    right arm AV graft, resolved post transplant  . Hypertension   . Hypothyroid   . 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 Kidney disease 2011  . Pneumonia ~ 2007?  [name] Type II diabetes mellitus (HCC)    Insulin dependant    Past Surgical History:  Procedure Laterality Date  . AMPUTATION Left 05/11/2014   Procedure: AMPUTATION LEFT GREAT TOE;  Surgeon: Kathryne Hitch, MD;  Location: [facility]S;  Service: Orthopedics;  Laterality: Left;  . AV FISTULA PLACEMENT Right    forearm  . BACK SURGERY    . CATARACT EXTRACTION W/ INTRAOCULAR LENS  IMPLANT, BILATERAL Bilateral   . CESAREAN SECTION  07/1999  . DG AV DIALYSIS GRAFT DECLOT OR    . HIP PINNING,CANNULATED Right 06/02/2017   Procedure: CANNULATED RIGHT HIP PINNING;  Surgeon: Beverely Low, MD;  Location: [facility];  Service: Orthopedics;  Laterality: Right;  . INCISION AND DRAINAGE Right 06/02/2015   Procedure: INCISION AND DRAINAGE OF RIGHT 3rd RAY RESECTION;  Surgeon: Kathryne Hitch, MD;  Location: [facility] OR;  Service: Orthopedics;  Laterality: Right;  . 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 LUMBAR DISC SURGERY  2001  . NEPHRECTOMY TRANSPLANTED ORGAN    . TOE AMPUTATION Right    1,2 & 3rd toes.  . TUBAL LIGATION  07/1999     reports that she quit smoking about 19 years ago. Her smoking use included cigarettes. She has a 11.00 pack-year smoking history. she has never used smokeless tobacco. She reports that she does not drink alcohol or use drugs.  Allergies  Allergen Reactions  . Latex Itching  . Ace Inhibitors Cough  . Adhesive [Tape] Itching and Other (See Comments)  Please use paper tape  . Lisinopril Cough    Family History  Problem Relation Age of Onset  . Emphysema Mother   . Throat cancer Mother   . COPD Mother   . Cancer Mother   . Emphysema Father   . COPD Father   . Stroke Father   . ADD / ADHD Son    Family history reviewed and not pertinent  Prior to Admission medications   Medication Sig Start Date End Date Taking? Authorizing Provider  acetaminophen (TYLENOL) 325 MG tablet Take 1 tablet (325  mg total) by mouth every 6 (six) hours as needed for mild pain, moderate pain or headache. 11/12/17   Albertine Grates, MD  carvedilol (COREG) 25 MG tablet Take 1 tablet (25 mg total) by mouth 2 (two) times daily with a meal. 11/12/17   Albertine Grates, MD  Continuous Blood Gluc Receiver (FREESTYLE LIBRE READER) DEVI 1 each by Does not apply route 4 (four) times daily. 04/29/17   Leatha Gilding, MD  Continuous Blood Gluc Sensor (FREESTYLE LIBRE SENSOR SYSTEM) MISC 1 sensor every 10 days 04/29/17   Leatha Gilding, MD  doxycycline (VIBRA-TABS) 100 MG tablet Take 1 tablet (100 mg total) by mouth every 12 (twelve) hours for 4 days. 11/12/17 11/16/17  Albertine Grates, MD  HYDROcodone-acetaminophen (NORCO) 5-325 MG tablet Take 1 tablet by mouth every 6 (six) hours as needed for moderate pain or severe pain. 11/12/17   Albertine Grates, MD  Insulin Isophane & Regular Human (NOVOLIN 70/30 FLEXPEN RELION) (70-30) 100 UNIT/ML PEN Inject 20 Units into the skin 2 (two) times daily before a meal. PER SLIDING SCALE    [provider]  lip balm (BLISTEX) OINT Apply 1 application topically as needed for lip care. 11/12/17   Albertine Grates, MD  mycophenolate (CELLCEPT) 250 MG capsule Take 500 mg by mouth 2 (two) times daily.     [provider]  nystatin (MYCOSTATIN/NYSTOP) powder Apply topically 3 (three) times daily. 11/12/17   Albertine Grates, MD  predniSONE (DELTASONE) 5 MG tablet Take 5 mg by mouth daily.  05/29/15   [provider]  senna-docusate (SENOKOT-S) 8.6-50 MG tablet Take 1 tablet by mouth at bedtime. 11/12/17   Albertine Grates, MD  sodium bicarbonate 650 MG tablet Take 2 tablets (1,300 mg total) by mouth 2 (two) times daily. 09/04/17   Penny Pia, MD  SYNTHROID 137 MCG tablet Take 137 mcg by mouth daily. 04/10/17   [provider]  tacrolimus (PROGRAF) 1 MG capsule Take 1 capsule (1 mg total) by mouth daily before breakfast. Patient taking differently: Take 0.5-1 mg by mouth daily before breakfast. Take 0.5 mg by mouth each  morning and 1 mg by mouth each evening 09/05/17   Penny Pia, MD  vitamin B-12 (CYANOCOBALAMIN) 100 MCG tablet Take 1 tablet (100 mcg total) by mouth daily. 03/29/17 03/29/18  Alba Cory, MD    Physical Exam: Vitals:   11/14/17 1258 11/14/17 1330 11/14/17 1415 11/14/17 1514  BP:  (!) 134/52 (!) 154/74 (!) 152/69  Pulse:  80 74 75  Resp:  19 (!) 24 17  Temp:      TempSrc:      SpO2: (!) 89% 94% 100% 99%    Constitutional: NAD, calm, comfortable Vitals:   11/14/17 1258 11/14/17 1330 11/14/17 1415 11/14/17 1514  BP:  (!) 134/52 (!) 154/74 (!) 152/69  Pulse:  80 74 75  Resp:  19 (!) 24 17  Temp:  TempSrc:      SpO2: (!) 89% 94% 100% 99%   Eyes: PERRL, lids and conjunctivae normal ENMT: Mucous membranes are moist. Posterior pharynx clear of any exudate or lesions.Normal dentition.  Neck: normal, supple, no masses, no thyromegaly Respiratory: Left-sided rhonchi decreased breath sounds at the left base mid area.  Air entry fair on the right side. Cardiovascular: Regular rate and rhythm, no murmurs  . 2+ pedal pulses. No carotid bruits.  Abdomen: no tenderness, no masses palpated. No hepatosplenomegaly. Bowel sounds positive.  Musculoskeletal: Bilateral lower extremity chronic venous stasis changes with erythema and induration on the dorsal aspects of legs.  Multiple scabs on the lower extremities. Skin: 12 scabs on the lower extremities neurologic: CN 2-12 grossly intact. Sensation intact, DTR normal. Strength 5/5 in all 4.  Psychiatric:  Alert and oriented x 3. Normal mood.    Labs on Admission: I have personally reviewed following labs and imaging studies  CBC: Recent Labs  Lab 11/08/17 0534 11/09/17 0530 11/10/17 0528 11/12/17 0507 11/14/17 1313  WBC 6.0 6.9 7.8 9.7 10.0  NEUTROABS 4.6 5.3 6.3 8.1* 9.0*  HGB 8.8* 9.2* 8.9* 9.2* 10.1*  HCT 28.6* 30.0* 29.5* 29.5* 32.6*  MCV 93.2 91.2 94.9 91.9 91.3  PLT 153 210 246 325 321   Basic Metabolic Panel: Recent  Labs  Lab 11/08/17 0534 11/09/17 0530 11/10/17 0528 11/11/17 0501 11/12/17 0507 11/14/17 1313  NA 138  140 137 134* 132* 132* 136  K 4.0  4.0 3.7 4.8 4.5 5.1 5.0  CL 101  103 102 101 101 101 103  CO2 27  28 28 22 22 22 23   GLUCOSE 271*  276* 73 239* 26* 201* 290*  BUN 50*  52* 45* 48* 49* 49* 43*  CREATININE 2.15*  2.14* 1.93* 2.10* 2.32* 2.42* 2.29*  CALCIUM 8.6*  8.7* 8.7* 8.5* 8.6* 8.2* 8.6*  MG 1.9 1.8 1.9  --   --   --   PHOS 2.7  2.7 2.1* 3.1  --   --   --    GFR: Estimated Creatinine Clearance: 32.8 mL/min (A) (by C-G formula based on SCr of 2.29 mg/dL (H)). Liver Function Tests: Recent Labs  Lab 11/08/17 0534 11/09/17 0530 11/10/17 0528 11/14/17 1313  AST 12* 13* 11* 16  ALT 9* 9* 9* 11*  ALKPHOS 97 90 98 113  BILITOT 0.9 0.5 0.9 0.6  PROT 5.2* 5.3* 5.2* 6.1*  ALBUMIN 1.6*  1.6* 1.7* 1.6* 1.8*   No results for input(s): LIPASE, AMYLASE in the last 168 hours. No results for input(s): AMMONIA in the last 168 hours. Coagulation Profile: Recent Labs  Lab 11/14/17 1313  INR 0.95   Cardiac Enzymes: No results for input(s): CKTOTAL, CKMB, CKMBINDEX, TROPONINI in the last 168 hours. BNP (last 3 results) No results for input(s): PROBNP in the last 8760 hours. HbA1C: No results for input(s): HGBA1C in the last 72 hours. CBG: Recent Labs  Lab 11/11/17 2119 11/12/17 0202 11/12/17 0746 11/12/17 1202 11/14/17 1256  GLUCAP 210* 208* 214* 194* 280*   Lipid Profile: No results for input(s): CHOL, HDL, LDLCALC, TRIG, CHOLHDL, LDLDIRECT in the last 72 hours. Thyroid Function Tests: No results for input(s): TSH, T4TOTAL, FREET4, T3FREE, THYROIDAB in the last 72 hours. Anemia Panel: No results for input(s): VITAMINB12, FOLATE, FERRITIN, TIBC, IRON, RETICCTPCT in the last 72 hours. Urine analysis:    Component Value Date/Time   COLORURINE YELLOW 11/14/2017 1425   APPEARANCEUR CLOUDY (A) 11/14/2017 1425   LABSPEC 1.011 11/14/2017  1425   PHURINE 6.0  11/14/2017 1425   GLUCOSEU >=500 (A) 11/14/2017 1425   HGBUR SMALL (A) 11/14/2017 1425   BILIRUBINUR NEGATIVE 11/14/2017 1425   KETONESUR 5 (A) 11/14/2017 1425   PROTEINUR 100 (A) 11/14/2017 1425   UROBILINOGEN 0.2 03/02/2015 1153   NITRITE NEGATIVE 11/14/2017 1425   LEUKOCYTESUR LARGE (A) 11/14/2017 1425    Radiological Exams on Admission: Ct Chest Wo Contrast  Result Date: 11/14/2017 CLINICAL DATA:  Short of breath for several days.  No chest pain. EXAM: CT CHEST WITHOUT CONTRAST TECHNIQUE: Multidetector CT imaging of the chest was performed following the standard protocol without IV contrast. COMPARISON:  Current chest radiographs. FINDINGS: Cardiovascular: Heart is normal in size and configuration. No pericardial effusion. There are three-vessel coronary artery calcifications. Great vessels are normal in caliber. Mild aortic atherosclerotic calcifications. Mediastinum/Nodes: Hypoattenuating nodule from the inferior aspect of the thyroid measuring 2.5 cm. No neck base or axillary masses or pathologically enlarged lymph nodes. Several prominent mediastinal lymph nodes, largest a left paratracheal node adjacent to the AP window measuring 14 mm in short axis. No mediastinal or hilar masses. No discrete enlarged or prominent hilar lymph nodes. Trachea is normal in caliber and widely patent. Esophagus is unremarkable. Lungs/Pleura: Moderate left and small right pleural effusions. There are bilateral areas of patchy ground-glass and more confluent lung consolidation, most evident in the left upper lobe. Additional opacity is noted in the left lower lobe which is likely a combination of infection and atelectasis. There is a discrete pleural based nodule in the right lower lobe, image 127, series 7, measuring 9 mm. No pneumothorax. Upper Abdomen: Area of hypoattenuation noted in the liver adjacent to the falciform ligament consistent with focal fatty infiltration. There is marked atrophy of the visualize  kidneys. Diffuse vascular calcifications are noted. No acute findings. Musculoskeletal: No fracture or acute finding. No osteoblastic or osteolytic lesions. There is surrounding subcutaneous soft tissue edema. IMPRESSION: 1. Findings are consistent with multifocal pneumonia, most evident in the left upper lobe. There are associated moderate left and small right pleural effusions. 2. Prominent to borderline enlarged mediastinal lymph nodes are presumed reactive given the changes in the lungs. 3. Coronary artery calcifications and mild aortic atherosclerosis. Aortic Atherosclerosis (ICD10-I70.0). Electronically Signed   By: Amie Portland M.D.   On: 11/14/2017 16:06   Dg Chest Port 1 View  Result Date: 11/14/2017 CLINICAL DATA:  Shortness of breath EXAM: PORTABLE CHEST 1 VIEW COMPARISON:  11/06/2017 FINDINGS: Extensive airspace disease on the left with some volume loss. Normal heart size when accounting for technique. The right lung is relatively clear. No definite effusion. No pneumothorax. IMPRESSION: Extensive left-sided airspace disease consistent with pneumonia in the clinical appropriate setting. Electronically Signed   By: Marnee Spring M.D.   On: 11/14/2017 13:33    EKG: Independently reviewed. SINUS WITH RBBB.   Assessment/Plan Active Problems:   Immunosuppression (HCC)   Hypothyroidism   H/O kidney transplant   AKI (acute kidney injury) (HCC)   Diabetic foot infection (HCC)   Essential hypertension   GERD (gastroesophageal reflux disease)   Diarrhea   Insulin dependent diabetes mellitus (HCC)   CKD (chronic kidney disease) stage 3, GFR 30-59 ml/min (HCC)   Anemia of chronic disease   Type II diabetes mellitus (HCC)   Status post kidney transplant   Pneumonia   HCAP (healthcare-associated pneumonia)    Acute respiratory failure with hypoxia secondary to multifocal pneumonia/healthcare associated pneumonia in a immunosuppressed patient and left-sided pleural effusion.  Admit to  stepdown for closer monitoring and she is requiring 100% nonrebreather. Blood cultures prior to starting the broad-spectrum antibiotics with IV vancomycin and IV cefepime. Sputum cultures ordered urine for strep pneumonia ordered. Ultrasound thoracentesis on the left with fluid analysis. BNP elevated but it is better than last admission we will also get echocardiogram to evaluate for worsening of diastolic heart failure.  She currently denies any chest pain.  Initial troponin is negative. Try to wean her off the oxygen and follow blood cultures.   Bilateral lower extremity cellulitis Appears to be improving   Insulin-dependent diabetes mellitus uncontrolled with hyperglycemia last hemoglobin A1c of 8.9.  Will need further control of  blood sugars.  Resume home insulin and SSI.   History of stage III CKD, history of kidney transplant on chronic immunosuppression Baseline creatinine around 2.  Her creatinine on admission is better than 2 days ago was 2.29. Resume patient's home dose of Prograf and prednisone.  Hypothyroidism Resume  home dose Synthroid   Hypertension Well controlled resume home medication.   Diarrhea suspect probably from antibiotics if persistent or becomes watery check for C. difficile PCR.   History of chronic diastolic heart failure Get daily weights intake and output and echocardiogram.   Anemia of chronic disease Monitor hemoglobin   GERD Stable    DVT prophylaxis: Lovenox Code Status: Full code Family Communication: None at bedside Disposition Plan: Pending resolution of respiratory failure/pneumonia Consults called: None Admission status: Inpatient SDU   Kathlen ModyVijaya Gilbert Manolis MD [facility] Pager [phone]  If 7PM-7AM, please contact night-coverage [email or web address] Password [email or web address]  11/14/2017, 5:09 PM

## 2017-11-14 NOTE — ED Notes (Signed)
Unsuccessful lab draw for 2nd set of blood cultures. Stuck patient 2times

## 2017-11-14 NOTE — ED Provider Notes (Signed)
[facility]-EMERGENCY DEPT Provider Note   CSN: [id number] Arrival date & time: 11/14/17  1238     History   Chief Complaint Chief Complaint  Patient presents with  . Shortness of Breath    HPI [name] is a 52 y.o. female.  HPI Patient reports she was discharged from the hospital 2 days ago.  She reports yesterday and today she has become very short of breath.  She denies she is developed fever.  She reports she has been coughing.  She was admitted for swelling and pain in her legs.  She reports are still uncomfortable and swollen.  Patient has history of renal transplant.  Kidneys are functioning. Past Medical History:  Diagnosis Date  . Arthritis    "elbows, knees, legs, back" (09/24/2015)  . CKD (chronic kidney disease)   . Daily headache   . Depression    "years ago"  . End stage renal disease (HCC)    right arm AV graft, resolved post transplant  . Hypertension   . Hypothyroid   . 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 Kidney disease 2011  . Pneumonia ~ 2007?  [name] Type II diabetes mellitus (HCC)    Insulin dependant    Patient Active Problem List   Diagnosis Date Noted  . HCAP (healthcare-associated pneumonia) 11/14/2017  . Localized swelling of lower extremity 11/05/2017  . Malnutrition of moderate degree 09/02/2017  . Pneumonia   . Status post kidney transplant 06/02/2017  . Closed displaced fracture of right femoral neck (HCC) 06/02/2017  . Femoral neck fracture (HCC) 06/02/2017  . Hyperglycemia 05/09/2017  . Lower extremity cellulitis 03/23/2017  . Type II diabetes mellitus (HCC) 03/23/2017  . Anemia of chronic disease 01/26/2017  . Abnormal LFTs 01/26/2017  . DKA (diabetic ketoacidoses) (HCC) 08/12/2016  . Hypoglycemia   . Wound cellulitis   . CKD (chronic kidney disease) stage 3, GFR 30-59 ml/min (HCC) 06/26/2016  . Cellulitis of right lower extremity   . QT prolongation 05/13/2016  . UTI (lower  urinary tract infection)   . Hypomagnesemia   . Pressure ulcer 04/11/2016  . Acute encephalopathy 04/11/2016  . Altered mental status 04/10/2016  . Insulin dependent diabetes mellitus (HCC) 04/10/2016  . Diabetic ketoacidosis without coma associated with type 2 diabetes mellitus (HCC) 11/02/2015  . Uncontrolled type 1 diabetes mellitus with foot ulcer (HCC) 06/04/2015  . Foot ulcer, right (HCC)   . Diarrhea 06/02/2015  . Type 1 diabetes mellitus with diabetic foot ulcer (HCC) 06/02/2015  . Acute renal failure superimposed on stage 3 chronic kidney disease (HCC) 06/02/2015  . Diabetic foot infection (HCC) 06/01/2015  . Essential hypertension 06/01/2015  . GERD (gastroesophageal reflux disease) 06/01/2015  . AKI (acute kidney injury) (HCC) 01/02/2015  . H/O kidney transplant 08/04/2012  . Hyponatremia 09/22/2011  . Immunosuppression (HCC)   . Hypothyroidism     Past Surgical History:  Procedure Laterality Date  . AMPUTATION Left 05/11/2014   Procedure: AMPUTATION LEFT GREAT TOE;  Surgeon: Kathryne Hitch, MD;  Location: [facility];  Service: Orthopedics;  Laterality: Left;  . AV FISTULA PLACEMENT Right    forearm  . BACK SURGERY    . CATARACT EXTRACTION W/ INTRAOCULAR LENS  IMPLANT, BILATERAL Bilateral   . CESAREAN SECTION  07/1999  . DG AV DIALYSIS GRAFT DECLOT OR    . HIP PINNING,CANNULATED Right 06/02/2017   Procedure: CANNULATED RIGHT HIP PINNING;  Surgeon: Beverely Low, MD;  Location: [facility];  Service: Orthopedics;  Laterality: Right;  . INCISION AND DRAINAGE Right 06/02/2015   Procedure: INCISION AND DRAINAGE OF RIGHT 3rd RAY RESECTION;  Surgeon: Kathryne Hitchhristopher Y Blackman, MD;  Location: [facility] OR;  Service: Orthopedics;  Laterality: Right;  . 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 LUMBAR DISC SURGERY  2001  . NEPHRECTOMY TRANSPLANTED ORGAN    . TOE AMPUTATION Right    1,2 & 3rd toes.  . TUBAL LIGATION  07/1999    OB History    Gravida Para Term Preterm AB Living              1   SAB TAB Ectopic Multiple Live Births                   Home Medications    Prior to Admission medications   Medication Sig Start Date End Date Taking? Authorizing Provider  acetaminophen (TYLENOL) 325 MG tablet Take 1 tablet (325 mg total) by mouth every 6 (six) hours as needed for mild pain, moderate pain or headache. 11/12/17   Albertine GratesXu, Fang, MD  carvedilol (COREG) 25 MG tablet Take 1 tablet (25 mg total) by mouth 2 (two) times daily with a meal. 11/12/17   Albertine GratesXu, Fang, MD  Continuous Blood Gluc Receiver (FREESTYLE LIBRE READER) DEVI 1 each by Does not apply route 4 (four) times daily. 04/29/17   Leatha GildingGherghe, Costin M, MD  Continuous Blood Gluc Sensor (FREESTYLE LIBRE SENSOR SYSTEM) MISC 1 sensor every 10 days 04/29/17   Leatha GildingGherghe, Costin M, MD  doxycycline (VIBRA-TABS) 100 MG tablet Take 1 tablet (100 mg total) by mouth every 12 (twelve) hours for 4 days. 11/12/17 11/16/17  Albertine GratesXu, Fang, MD  HYDROcodone-acetaminophen (NORCO) 5-325 MG tablet Take 1 tablet by mouth every 6 (six) hours as needed for moderate pain or severe pain. 11/12/17   Albertine GratesXu, Fang, MD  Insulin Isophane & Regular Human (NOVOLIN 70/30 FLEXPEN RELION) (70-30) 100 UNIT/ML PEN Inject 20 Units into the skin 2 (two) times daily before a meal. PER SLIDING SCALE    [provider]  lip balm (BLISTEX) OINT Apply 1 application topically as needed for lip care. 11/12/17   Albertine GratesXu, Fang, MD  mycophenolate (CELLCEPT) 250 MG capsule Take 500 mg by mouth 2 (two) times daily.     [provider]  nystatin (MYCOSTATIN/NYSTOP) powder Apply topically 3 (three) times daily. 11/12/17   Albertine GratesXu, Fang, MD  predniSONE (DELTASONE) 5 MG tablet Take 5 mg by mouth daily.  05/29/15   [provider]  senna-docusate (SENOKOT-S) 8.6-50 MG tablet Take 1 tablet by mouth at bedtime. 11/12/17   Albertine GratesXu, Fang, MD  sodium bicarbonate 650 MG tablet Take 2 tablets (1,300 mg total) by mouth 2 (two) times daily. 09/04/17   Penny PiaVega, Orlando, MD  SYNTHROID 137 MCG tablet Take 137 mcg by  mouth daily. 04/10/17   [provider]  tacrolimus (PROGRAF) 1 MG capsule Take 1 capsule (1 mg total) by mouth daily before breakfast. Patient taking differently: Take 0.5-1 mg by mouth daily before breakfast. Take 0.5 mg by mouth each morning and 1 mg by mouth each evening 09/05/17   Penny PiaVega, Orlando, MD  vitamin B-12 (CYANOCOBALAMIN) 100 MCG tablet Take 1 tablet (100 mcg total) by mouth daily. 03/29/17 03/29/18  Regalado, Prentiss BellsBelkys A, MD    Family History Family History  Problem Relation Age of Onset  . Emphysema Mother   . Throat cancer Mother   . COPD Mother   . Cancer Mother   . Emphysema  Father   . COPD Father   . Stroke Father   . ADD / ADHD Son     Social History Social History   Tobacco Use  . Smoking status: Former Smoker    Packs/day: 1.00    Years: 11.00    Pack years: 11.00    Types: Cigarettes    Last attempt to quit: 09/21/1998    Years since quitting: 19.1  . Smokeless tobacco: Never Used  Substance Use Topics  . Alcohol use: No  . Drug use: No     Allergies   Latex; Ace inhibitors; Adhesive [tape]; and Lisinopril   Review of Systems Review of Systems 10 Systems reviewed and are negative for acute change except as noted in the HPI.   Physical Exam Updated Vital Signs BP (!) 152/69 (BP Location: Left Arm)   Pulse 75   Temp 98 F (36.7 C) (Oral)   Resp 17   LMP 11/19/2011   SpO2 99%   Physical Exam  Constitutional: She is oriented to person, place, and time.  Patient is alert.  She has mild to moderate respiratory distress with tachypnea.  Mental status is clear.  Speaking in full sentences.  Generally deconditioned.  HENT:  Head: Normocephalic and atraumatic.  Mouth is dry.  Posterior oropharynx patent.  Eyes: EOM are normal.  Neck: Neck supple.  Cardiovascular: Normal rate, regular rhythm and normal heart sounds.  Pulmonary/Chest:  Mild to moderate increased work of breathing.  Soft breath sounds on the left.  Bilateral scattered  crackle and expiratory wheeze.  Abdominal: Soft. She exhibits no distension. There is no tenderness.  Musculoskeletal:  Patient has 2+ edema bilaterally.  Erythema of the pretibial surfaces.  Neurological: She is alert and oriented to person, place, and time. She exhibits normal muscle tone. Coordination normal.  Skin: Skin is warm and dry. There is pallor.  Psychiatric: She has a normal mood and affect.     ED Treatments / Results  Labs (all labs ordered are listed, but only abnormal results are displayed) Labs Reviewed  COMPREHENSIVE METABOLIC PANEL - Abnormal; Notable for the following components:      Result Value   Glucose, Bld 290 (*)    BUN 43 (*)    Creatinine, Ser 2.29 (*)    Calcium 8.6 (*)    Total Protein 6.1 (*)    Albumin 1.8 (*)    ALT 11 (*)    GFR calc non Af Amer 24 (*)    GFR calc Af Amer 27 (*)    All other components within normal limits  BRAIN NATRIURETIC PEPTIDE - Abnormal; Notable for the following components:   B Natriuretic Peptide 958.8 (*)    All other components within normal limits  CBC WITH DIFFERENTIAL/PLATELET - Abnormal; Notable for the following components:   RBC 3.57 (*)    Hemoglobin 10.1 (*)    HCT 32.6 (*)    Neutro Abs 9.0 (*)    Lymphs Abs 0.6 (*)    All other components within normal limits  CBG MONITORING, ED - Abnormal; Notable for the following components:   Glucose-Capillary 280 (*)    All other components within normal limits  CULTURE, BLOOD (ROUTINE X 2)  URINE CULTURE  CULTURE, BLOOD (ROUTINE X 2)  PROTIME-INR  URINALYSIS, ROUTINE W REFLEX MICROSCOPIC  I-STAT CG4 LACTIC ACID, ED  I-STAT TROPONIN, ED    EKG  EKG Interpretation  Date/Time:  Sunday November 14 2017 12:59:43 EDT Ventricular Rate:  76 PR  Interval:    QRS Duration: 134 QT Interval:  447 QTC Calculation: 503 R Axis:   166 Text Interpretation:  Sinus rhythm Borderline short PR interval RBBB and LPFB agree. no sig change from previous Confirmed by  Arby Barrette [phone]) on 11/14/2017 4:27:34 PM       Radiology Ct Chest Wo Contrast  Result Date: 11/14/2017 CLINICAL DATA:  Short of breath for several days.  No chest pain. EXAM: CT CHEST WITHOUT CONTRAST TECHNIQUE: Multidetector CT imaging of the chest was performed following the standard protocol without IV contrast. COMPARISON:  Current chest radiographs. FINDINGS: Cardiovascular: Heart is normal in size and configuration. No pericardial effusion. There are three-vessel coronary artery calcifications. Great vessels are normal in caliber. Mild aortic atherosclerotic calcifications. Mediastinum/Nodes: Hypoattenuating nodule from the inferior aspect of the thyroid measuring 2.5 cm. No neck base or axillary masses or pathologically enlarged lymph nodes. Several prominent mediastinal lymph nodes, largest a left paratracheal node adjacent to the AP window measuring 14 mm in short axis. No mediastinal or hilar masses. No discrete enlarged or prominent hilar lymph nodes. Trachea is normal in caliber and widely patent. Esophagus is unremarkable. Lungs/Pleura: Moderate left and small right pleural effusions. There are bilateral areas of patchy ground-glass and more confluent lung consolidation, most evident in the left upper lobe. Additional opacity is noted in the left lower lobe which is likely a combination of infection and atelectasis. There is a discrete pleural based nodule in the right lower lobe, image 127, series 7, measuring 9 mm. No pneumothorax. Upper Abdomen: Area of hypoattenuation noted in the liver adjacent to the falciform ligament consistent with focal fatty infiltration. There is marked atrophy of the visualize kidneys. Diffuse vascular calcifications are noted. No acute findings. Musculoskeletal: No fracture or acute finding. No osteoblastic or osteolytic lesions. There is surrounding subcutaneous soft tissue edema. IMPRESSION: 1. Findings are consistent with multifocal pneumonia, most  evident in the left upper lobe. There are associated moderate left and small right pleural effusions. 2. Prominent to borderline enlarged mediastinal lymph nodes are presumed reactive given the changes in the lungs. 3. Coronary artery calcifications and mild aortic atherosclerosis. Aortic Atherosclerosis (ICD10-I70.0). Electronically Signed   By: Amie Portland M.D.   On: 11/14/2017 16:06   Dg Chest Port 1 View  Result Date: 11/14/2017 CLINICAL DATA:  Shortness of breath EXAM: PORTABLE CHEST 1 VIEW COMPARISON:  11/06/2017 FINDINGS: Extensive airspace disease on the left with some volume loss. Normal heart size when accounting for technique. The right lung is relatively clear. No definite effusion. No pneumothorax. IMPRESSION: Extensive left-sided airspace disease consistent with pneumonia in the clinical appropriate setting. Electronically Signed   By: Marnee Spring M.D.   On: 11/14/2017 13:33    Procedures Procedures (including critical care time) CRITICAL CARE Performed by: Cristy Friedlander   Total critical care time: 45 minutes  Critical care time was exclusive of separately billable procedures and treating other patients.  Critical care was necessary to treat or prevent imminent or life-threatening deterioration.  Critical care was time spent personally by me on the following activities: development of treatment plan with patient and/or surrogate as well as nursing, discussions with consultants, evaluation of patient's response to treatment, examination of patient, obtaining history from patient or surrogate, ordering and performing treatments and interventions, ordering and review of laboratory studies, ordering and review of radiographic studies, pulse oximetry and re-evaluation of patient's condition. Medications Ordered in ED Medications  furosemide (LASIX) injection 40 mg (40 mg Intravenous Given 11/14/17  1513)     Initial Impression / Assessment and Plan / ED Course  I have  reviewed the triage vital signs and the nursing notes.  Pertinent labs & imaging results that were available during my care of the patient were reviewed by me and considered in my medical decision making (see chart for details).    Consult: [facility] hospitalist for admission.  Final Clinical Impressions(s) / ED Diagnoses   Final diagnoses:  Hypoxia  Acute on chronic congestive heart failure, unspecified heart failure type (HCC)  Severe comorbid illness   Patient was hospitalized approximately 2 days ago.  She is gotten quickly much more short of breath over the past day.  She arrives with hypoxia.  Patient denies use of home oxygen.  She is requiring supplemental oxygen to maintain oxygen saturation.  Patient however does not show signs at this point of severe fatigue or impending intubation.  Chest x-ray does identify significant infiltrate changes on left.  Patient however does not have leukocytosis, lactic acidosis, fever which, given the size and symptomatic nature of the infiltrate in the left chest x-ray would be more keeping with pneumonia.  Patient does have elevated BNP.  She was recently hospitalized and likely increased fluids administered for lower extremity cellulitis.  I have higher suspicion for CHF at this time as compared to pneumonia.  Patient given Lasix 40 mg IV and supplemental oxygen.  Plan will be for admission. ED Discharge Orders    None       Arby Barrette, MD 11/14/17 [phone]

## 2017-11-14 NOTE — ED Notes (Signed)
ED TO INPATIENT HANDOFF REPORT  Name/Age/Gender [name] 52 y.o. female  Code Status Code Status History    Date Active Date Inactive Code Status Order ID Comments User Context   11/05/2017 01:14 11/12/2017 17:07 Full Code [id number]  Norval Morton, MD ED   08/31/2017 19:37 09/04/2017 20:03 Full Code [id number]  Etheleen Nicks, MD ED   06/02/2017 16:29 06/06/2017 13:58 Full Code [id number]  Donne Hazel, MD ED   05/09/2017 13:39 05/10/2017 20:16 Full Code [id number]  Radene Gunning, NP ED   04/25/2017 16:24 04/29/2017 19:01 Full Code [id number]  Georgette Shell, MD ED   04/25/2017 16:19 04/25/2017 16:24 Full Code [id number]  Georgette Shell, MD ED   04/13/2017 00:04 04/14/2017 16:51 Full Code [id number]  Karmen Bongo, MD Inpatient   03/23/2017 23:23 03/29/2017 16:36 Full Code [id number]  Reubin Milan, MD Inpatient   03/01/2017 00:07 03/03/2017 17:22 Full Code [id number]  Rise Patience, MD ED   01/26/2017 23:40 01/28/2017 16:54 Full Code [id number]  Reubin Milan, MD Inpatient   01/26/2017 23:40 01/26/2017 23:40 Full Code [id number]  Reubin Milan, MD Inpatient   01/01/2017 23:39 01/02/2017 19:27 Full Code [id number]  Lily Kocher, MD ED   10/05/2016 15:40 10/09/2016 17:35 Full Code [id number]  Patrecia Pour, MD Inpatient   09/16/2016 20:56 09/18/2016 21:01 Full Code [id number]  Barton Dubois, MD Inpatient   08/12/2016 00:58 08/14/2016 18:31 Full Code [id number]  Vianne Bulls, MD ED   07/21/2016 23:44 07/23/2016 21:36 Full Code [id number]  Rise Patience, MD Inpatient   06/26/2016 14:50 06/30/2016 23:02 Full Code [id number]  Debbe Odea, MD ED   05/13/2016 03:21 05/15/2016 23:25 Full Code [id number]  Toy Baker, MD Inpatient   04/10/2016 09:39 04/19/2016 20:32 Full Code [id number]  Roxan Hockey, MD Inpatient   03/27/2016 15:53 03/31/2016 22:44 Full Code [id number]  Erick Colace, NP ED   11/03/2015 00:32 11/06/2015 15:25 Full Code [id number]  Reubin Milan, MD  Inpatient   09/24/2015 14:22 09/26/2015 16:12 Full Code [id number]  Waldemar Dickens, MD ED   09/24/2015 14:22 09/24/2015 14:22 Full Code [id number]  Waldemar Dickens, MD ED   06/01/2015 23:09 06/06/2015 22:14 Full Code [id number]  Ivor Costa, MD ED   03/02/2015 13:32 03/04/2015 20:21 Full Code [id number]  Robbie Lis, MD ED   01/02/2015 15:44 01/03/2015 20:28 Full Code [id number]  Verlee Monte, MD Inpatient   10/22/2014 19:37 10/25/2014 15:07 Full Code [id number]  Waldemar Dickens, MD ED   06/07/2014 21:43 06/10/2014 15:20 Full Code [id number]  Etta Quill, DO ED   05/03/2014 21:52 05/12/2014 19:07 Full Code [id number]  Rise Patience, MD Inpatient   04/20/2014 03:08 04/21/2014 19:04 Full Code [id number]  Elmarie Shiley, MD Inpatient   03/21/2014 23:51 03/23/2014 19:17 Full Code [id number]  Merton Border, MD Inpatient   08/04/2013 12:36 08/06/2013 20:05 Full Code [id number]  Orson Eva, MD ED   06/05/2013 20:32 06/09/2013 21:41 Full Code [id number]  Rise Patience, MD Inpatient   03/05/2013 02:31 03/07/2013 14:11 Full Code [id number]  Hosie Poisson, MD ED   01/05/2013 10:56 01/07/2013 15:43 Full Code [id number]  Modena Jansky, MD Inpatient   08/04/2012 15:43 08/08/2012 16:59 Full Code [id number]  Barry Dienes ED   11/20/2011 16:11 11/22/2011 13:57 Full Code [id number]  Charlynne Cousins, MD Inpatient   09/22/2011 19:07 09/26/2011 16:45 Full Code [id number]  Bynum Bellows, MD Inpatient      Home/SNF/Other Home  Chief  Complaint SHoB  Level of Care/Admitting Diagnosis ED Disposition    ED Disposition Condition Comment   Admit  Hospital Area: [facility] [[id number]]  Level of Care: Stepdown [14]  Admit to SDU based on following criteria: Respiratory Distress:  Frequent assessment and/or intervention to maintain adequate ventilation/respiration, pulmonary toilet, and respiratory treatment.  Diagnosis: HCAP (healthcare-associated pneumonia) [518841]  Admitting Physician: Hosie Poisson [[id number]]   Attending Physician: Hosie Poisson [[id number]]  Estimated length of stay: 3 - 4 days  Certification:: I certify this patient will need inpatient services for at least 2 midnights  PT Class (Do Not Modify): Inpatient [101]  PT Acc Code (Do Not Modify): Private [1]       Medical History Past Medical History:  Diagnosis Date  . Arthritis    "elbows, knees, legs, back" (09/24/2015)  . CKD (chronic kidney disease)   . Daily headache   . Depression    "years ago"  . End stage renal disease (HCC)    right arm AV graft, resolved post transplant  . Hypertension   . Hypothyroid   . 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 Kidney disease 2011  . Pneumonia ~ 2007?  [name] Type II diabetes mellitus (HCC)    Insulin dependant    Allergies Allergies  Allergen Reactions  . Latex Itching  . Ace Inhibitors Cough  . Adhesive [Tape] Itching and Other (See Comments)    Please use paper tape  . Lisinopril Cough    IV Location/Drains/Wounds Patient Lines/Drains/Airways Status   Active Line/Drains/Airways    Name:   Placement date:   Placement time:   Site:   Days:   Peripheral IV 11/14/17 Right Antecubital   11/14/17    1309    Antecubital   less than 1   External Urinary Catheter   11/10/17    1800    -   4   Incision (Closed) 06/02/17 Hip Right   06/02/17    2040     165          Labs/Imaging Results for orders placed or performed during the hospital encounter of 11/14/17 (from the past 48 hour(s))  CBG monitoring, ED     Status: Abnormal   Collection Time: 11/14/17 12:56 PM  Result Value Ref Range   Glucose-Capillary 280 (H) 65 - 99 mg/dL  Comprehensive metabolic panel     Status: Abnormal   Collection Time: 11/14/17  1:13 PM  Result Value Ref Range   Sodium 136 135 - 145 mmol/L   Potassium 5.0 3.5 - 5.1 mmol/L   Chloride 103 101 - 111 mmol/L   CO2 23 22 - 32 mmol/L   Glucose, Bld 290 (H) 65 - 99 mg/dL   BUN 43 (H) 6 - 20 mg/dL   Creatinine, Ser 2.29 (H) 0.44 -  1.00 mg/dL   Calcium 8.6 (L) 8.9 - 10.3 mg/dL   Total Protein 6.1 (L) 6.5 - 8.1 g/dL   Albumin 1.8 (L) 3.5 - 5.0 g/dL   AST 16 15 - 41 U/L   ALT 11 (L) 14 - 54 U/L   Alkaline Phosphatase 113 38 - 126 U/L   Total Bilirubin 0.6 0.3 - 1.2 mg/dL   GFR calc non Af Amer 24 (L) >60 mL/min   GFR calc Af Amer 27 (L) >60 mL/min    Comment: (NOTE) The eGFR has been calculated using the CKD EPI equation. This calculation has not been validated in all clinical situations.  eGFR's persistently <60 mL/min signify possible Chronic Kidney Disease.    Anion gap 10 5 - 15    Comment: Performed at [facility], [place] [address] [place], [place] [address]  Brain natriuretic peptide     Status: Abnormal   Collection Time: 11/14/17  1:13 PM  Result Value Ref Range   B Natriuretic Peptide 958.8 (H) 0.0 - 100.0 pg/mL    Comment: Performed at [facility], [place] [address], [place], [place] [address]  CBC with Differential     Status: Abnormal   Collection Time: 11/14/17  1:13 PM  Result Value Ref Range   WBC 10.0 4.0 - 10.5 K/uL   RBC 3.57 (L) 3.87 - 5.11 MIL/uL   Hemoglobin 10.1 (L) 12.0 - 15.0 g/dL   HCT 32.6 (L) 36.0 - 46.0 %   MCV 91.3 78.0 - 100.0 fL   MCH 28.3 26.0 - 34.0 pg   MCHC 31.0 30.0 - 36.0 g/dL   RDW 14.8 11.5 - 15.5 %   Platelets 321 150 - 400 K/uL   Neutrophils Relative % 90 %   Lymphocytes Relative 6 %   Monocytes Relative 4 %   Eosinophils Relative 0 %   Basophils Relative 0 %   Neutro Abs 9.0 (H) 1.7 - 7.7 K/uL   Lymphs Abs 0.6 (L) 0.7 - 4.0 K/uL   Monocytes Absolute 0.4 0.1 - 1.0 K/uL   Eosinophils Absolute 0.0 0.0 - 0.7 K/uL   Basophils Absolute 0.0 0.0 - 0.1 K/uL   RBC Morphology HOWELL/JOLLY BODIES     Comment: Performed at [facility], [place] [address], [place], [place] [address]  Protime-INR     Status: None   Collection Time: 11/14/17  1:13 PM  Result Value Ref Range   Prothrombin Time 12.6 11.4 - 15.2 seconds    INR 0.95     Comment: Performed at [facility], [place] [address] [place], [place] [address]  Culture, blood (routine x 2)     Status: None (Preliminary result)   Collection Time: 11/14/17  1:13 PM  Result Value Ref Range   Specimen Description BLOOD RIGHT ANTECUBITAL    Special Requests      BOTTLES DRAWN AEROBIC AND ANAEROBIC Blood Culture adequate volume Performed at [place] [address], [place], [place] [address]    Culture PENDING    Report Status PENDING   I-stat troponin, ED     Status: None   Collection Time: 11/14/17  1:30 PM  Result Value Ref Range   Troponin i, poc 0.01 0.00 - 0.08 ng/mL   Comment 3            Comment: Due to the release kinetics of cTnI, a negative result within the first hours of the onset of symptoms does not rule out myocardial infarction with certainty. If myocardial infarction is still suspected, repeat the test at appropriate intervals.   I-Stat CG4 Lactic Acid, ED     Status: None   Collection Time: 11/14/17  1:32 PM  Result Value Ref Range   Lactic Acid, Venous 1.02 0.5 - 1.9 mmol/L  Urinalysis, Routine w reflex microscopic     Status: Abnormal   Collection Time: 11/14/17  2:25 PM  Result Value Ref Range   Color, Urine YELLOW YELLOW   APPearance CLOUDY (A) CLEAR   Specific Gravity, Urine 1.011 1.005 - 1.030   pH 6.0 5.0 - 8.0   Glucose, UA >=500 (A)  NEGATIVE mg/dL   Hgb urine dipstick SMALL (A) NEGATIVE   Bilirubin Urine NEGATIVE NEGATIVE   Ketones, ur 5 (A) NEGATIVE mg/dL   Protein, ur 100 (A) NEGATIVE mg/dL   Nitrite NEGATIVE NEGATIVE   Leukocytes, UA LARGE (A) NEGATIVE   RBC / HPF TOO NUMEROUS TO COUNT 0 - 5 RBC/hpf   WBC, UA TOO NUMEROUS TO COUNT 0 - 5 WBC/hpf   Bacteria, UA RARE (A) NONE SEEN   Squamous Epithelial / LPF 0-5 (A) NONE SEEN    Comment: Performed at [facility], [place] [address] [place], [place] [address]   Ct Chest Wo Contrast  Result Date: 11/14/2017 CLINICAL  DATA:  Short of breath for several days.  No chest pain. EXAM: CT CHEST WITHOUT CONTRAST TECHNIQUE: Multidetector CT imaging of the chest was performed following the standard protocol without IV contrast. COMPARISON:  Current chest radiographs. FINDINGS: Cardiovascular: Heart is normal in size and configuration. No pericardial effusion. There are three-vessel coronary artery calcifications. Great vessels are normal in caliber. Mild aortic atherosclerotic calcifications. Mediastinum/Nodes: Hypoattenuating nodule from the inferior aspect of the thyroid measuring 2.5 cm. No neck base or axillary masses or pathologically enlarged lymph nodes. Several prominent mediastinal lymph nodes, largest a left paratracheal node adjacent to the AP window measuring 14 mm in short axis. No mediastinal or hilar masses. No discrete enlarged or prominent hilar lymph nodes. Trachea is normal in caliber and widely patent. Esophagus is unremarkable. Lungs/Pleura: Moderate left and small right pleural effusions. There are bilateral areas of patchy ground-glass and more confluent lung consolidation, most evident in the left upper lobe. Additional opacity is noted in the left lower lobe which is likely a combination of infection and atelectasis. There is a discrete pleural based nodule in the right lower lobe, image 127, series 7, measuring 9 mm. No pneumothorax. Upper Abdomen: Area of hypoattenuation noted in the liver adjacent to the falciform ligament consistent with focal fatty infiltration. There is marked atrophy of the visualize kidneys. Diffuse vascular calcifications are noted. No acute findings. Musculoskeletal: No fracture or acute finding. No osteoblastic or osteolytic lesions. There is surrounding subcutaneous soft tissue edema. IMPRESSION: 1. Findings are consistent with multifocal pneumonia, most evident in the left upper lobe. There are associated moderate left and small right pleural effusions. 2. Prominent to borderline  enlarged mediastinal lymph nodes are presumed reactive given the changes in the lungs. 3. Coronary artery calcifications and mild aortic atherosclerosis. Aortic Atherosclerosis (ICD10-I70.0). Electronically Signed   By: Lajean Manes M.D.   On: 11/14/2017 16:06   Dg Chest Port 1 View  Result Date: 11/14/2017 CLINICAL DATA:  Shortness of breath EXAM: PORTABLE CHEST 1 VIEW COMPARISON:  11/06/2017 FINDINGS: Extensive airspace disease on the left with some volume loss. Normal heart size when accounting for technique. The right lung is relatively clear. No definite effusion. No pneumothorax. IMPRESSION: Extensive left-sided airspace disease consistent with pneumonia in the clinical appropriate setting. Electronically Signed   By: Monte Fantasia M.D.   On: 11/14/2017 13:33    Pending Labs Unresulted Labs (From admission, onward)   Start     Ordered   11/14/17 1313  Urine culture  STAT,   STAT     11/14/17 1314   11/14/17 1313  Culture, blood (routine x 2)  BLOOD CULTURE X 2,   STAT     11/14/17 1314   Signed and Held  HIV antibody  Once,   R     Signed and Held  Signed and Held  Culture, sputum-assessment  Once,   R     Signed and Held   Signed and Held  Gram stain  Once,   R     Signed and Held   Signed and Held  Strep pneumoniae urinary antigen  Once,   R     Signed and Held   Signed and Held  Creatinine, serum  (enoxaparin (LOVENOX)    CrCl < 30 ml/min)  Weekly,   R    Comments:  while on enoxaparin therapy.    Signed and Held      Vitals/Pain Today's Vitals   11/14/17 1258 11/14/17 1330 11/14/17 1415 11/14/17 1514  BP:  (!) 134/52 (!) 154/74 (!) 152/69  Pulse:  80 74 75  Resp:  19 (!) 24 17  Temp:      TempSrc:      SpO2: (!) 89% 94% 100% 99%    Isolation Precautions No active isolations  Medications Medications  ceFEPIme (MAXIPIME) 1 g in sodium chloride 0.9 % 100 mL IVPB (not administered)  vancomycin (VANCOCIN) 1,750 mg in sodium chloride 0.9 % 500 mL IVPB (not  administered)  vancomycin (VANCOCIN) 1,250 mg in sodium chloride 0.9 % 250 mL IVPB (not administered)  furosemide (LASIX) injection 40 mg (40 mg Intravenous Given 11/14/17 1513)    Mobility walks with person assist

## 2017-11-14 NOTE — Progress Notes (Signed)
Pharmacy Antibiotic Note  [name] is a 52 y.o. female admitted on 11/14/2017 with pneumonia.  Recent hospitalization (discharged 11/12/17)  where treated for bilateral lower extremity with cellulitis (discharged on doxycycline).  PMH significant for HTN, DM, CKD s/p renal transplant.  Chest Xray concerning for pneumonia.  Pharmacy has been consulted for Vancomycin dosing.  Plan: - Vancomycin 1750mg  IV x 1 dose followed by 1250mg  IV q48h (Goal AUC 400-500) - Cefepime adjusted per renal dose adjustment protocol to 1gm IV q12h (for CrCl 32 ml/min) - Follow SCr - F/U cultures/sensitivities    Temp (24hrs), Avg:98 F (36.7 C), Min:98 F (36.7 C), Max:98 F (36.7 C)  Recent Labs  Lab 11/08/17 0534 11/09/17 0530 11/10/17 0528 11/11/17 0501 11/12/17 0507 11/14/17 1313 11/14/17 1332  WBC 6.0 6.9 7.8  --  9.7 10.0  --   CREATININE 2.15*  2.14* 1.93* 2.10* 2.32* 2.42* 2.29*  --   LATICACIDVEN  --   --   --   --   --   --  1.02    Estimated Creatinine Clearance: 32.8 mL/min (A) (by C-G formula based on SCr of 2.29 mg/dL (H)).    Allergies  Allergen Reactions  . Latex Itching  . Ace Inhibitors Cough  . Adhesive [Tape] Itching and Other (See Comments)    Please use paper tape  . Lisinopril Cough    Antimicrobials this admission: 3/10 Cefepime >>   3/10 Vanc >>    Dose adjustments this admission:    Microbiology results: 3/10 BCx: sent 3/10 UCx: sent   Thank you for allowing pharmacy to be a part of this patient's care.  Maryellen PilePoindexter, Ervan Heber Trefz, PharmD 11/14/2017 5:20 PM

## 2017-11-14 NOTE — ED Notes (Signed)
NRB applied

## 2017-11-15 ENCOUNTER — Inpatient Hospital Stay (HOSPITAL_COMMUNITY): Payer: Medicare Other

## 2017-11-15 DIAGNOSIS — I340 Nonrheumatic mitral (valve) insufficiency: Secondary | ICD-10-CM

## 2017-11-15 DIAGNOSIS — N179 Acute kidney failure, unspecified: Secondary | ICD-10-CM

## 2017-11-15 LAB — BODY FLUID CELL COUNT WITH DIFFERENTIAL
LYMPHS FL: 6 %
Monocyte-Macrophage-Serous Fluid: 93 % — ABNORMAL HIGH (ref 50–90)
NEUTROPHIL FLUID: 1 % (ref 0–25)
WBC FLUID: 51 uL (ref 0–1000)

## 2017-11-15 LAB — CBC WITH DIFFERENTIAL/PLATELET
Basophils Absolute: 0 10*3/uL (ref 0.0–0.1)
Basophils Relative: 0 %
EOS ABS: 0.2 10*3/uL (ref 0.0–0.7)
EOS PCT: 1 %
HCT: 31.7 % — ABNORMAL LOW (ref 36.0–46.0)
HEMOGLOBIN: 9.8 g/dL — AB (ref 12.0–15.0)
LYMPHS ABS: 0.8 10*3/uL (ref 0.7–4.0)
LYMPHS PCT: 7 %
MCH: 27.8 pg (ref 26.0–34.0)
MCHC: 30.9 g/dL (ref 30.0–36.0)
MCV: 89.8 fL (ref 78.0–100.0)
MONOS PCT: 4 %
Monocytes Absolute: 0.5 10*3/uL (ref 0.1–1.0)
NEUTROS PCT: 88 %
Neutro Abs: 10.7 10*3/uL — ABNORMAL HIGH (ref 1.7–7.7)
Platelets: 275 10*3/uL (ref 150–400)
RBC: 3.53 MIL/uL — ABNORMAL LOW (ref 3.87–5.11)
RDW: 14.7 % (ref 11.5–15.5)
WBC: 12.2 10*3/uL — ABNORMAL HIGH (ref 4.0–10.5)

## 2017-11-15 LAB — STREP PNEUMONIAE URINARY ANTIGEN: STREP PNEUMO URINARY ANTIGEN: NEGATIVE

## 2017-11-15 LAB — GLUCOSE, PLEURAL OR PERITONEAL FLUID: Glucose, Fluid: 102 mg/dL

## 2017-11-15 LAB — GLUCOSE, CAPILLARY
GLUCOSE-CAPILLARY: 28 mg/dL — AB (ref 65–99)
GLUCOSE-CAPILLARY: 35 mg/dL — AB (ref 65–99)
GLUCOSE-CAPILLARY: 121 mg/dL — AB (ref 65–99)
Glucose-Capillary: 83 mg/dL (ref 65–99)
Glucose-Capillary: 75 mg/dL (ref 65–99)
Glucose-Capillary: 75 mg/dL (ref 65–99)

## 2017-11-15 LAB — URINE CULTURE: CULTURE: NO GROWTH

## 2017-11-15 LAB — BASIC METABOLIC PANEL
Anion gap: 7 (ref 5–15)
BUN: 37 mg/dL — AB (ref 6–20)
CHLORIDE: 107 mmol/L (ref 101–111)
CO2: 25 mmol/L (ref 22–32)
CREATININE: 2.02 mg/dL — AB (ref 0.44–1.00)
Calcium: 8.6 mg/dL — ABNORMAL LOW (ref 8.9–10.3)
GFR calc Af Amer: 32 mL/min — ABNORMAL LOW (ref >60)
GFR calc non Af Amer: 28 mL/min — ABNORMAL LOW (ref >60)
GLUCOSE: 84 mg/dL (ref 65–99)
POTASSIUM: 4.2 mmol/L (ref 3.5–5.1)
SODIUM: 139 mmol/L (ref 135–145)

## 2017-11-15 LAB — C DIFFICILE QUICK SCREEN W PCR REFLEX
C DIFFICILE (CDIFF) INTERP: NOT DETECTED
C Diff antigen: NEGATIVE
C Diff toxin: NEGATIVE

## 2017-11-15 LAB — ECHOCARDIOGRAM COMPLETE
Height: 67 in
WEIGHTICAEL: 3107.6 [oz_av]

## 2017-11-15 LAB — PROTEIN, PLEURAL OR PERITONEAL FLUID: Total protein, fluid: <3 g/dL

## 2017-11-15 LAB — HIV ANTIBODY (ROUTINE TESTING W REFLEX): HIV Screen 4th Generation wRfx: NONREACTIVE

## 2017-11-15 LAB — LACTATE DEHYDROGENASE, PLEURAL OR PERITONEAL FLUID: LD, Fluid: 44 U/L — ABNORMAL HIGH (ref 3–23)

## 2017-11-15 MED ORDER — INSULIN ASPART PROT & ASPART (70-30 MIX) 100 UNIT/ML ~~LOC~~ SUSP
10.0000 [IU] | Freq: Two times a day (BID) | SUBCUTANEOUS | Status: DC
  Administered 2017-11-16 – 2017-11-18 (×5): 10 [IU] via SUBCUTANEOUS
  Administered 2017-11-19: 20 [IU] via SUBCUTANEOUS

## 2017-11-15 MED ORDER — HYDRALAZINE HCL 20 MG/ML IJ SOLN: 5.0000 mg | Freq: Four times a day (QID) | INTRAMUSCULAR | Status: DC | PRN

## 2017-11-15 MED ORDER — INSULIN ASPART PROT & ASPART (70-30 MIX) 100 UNIT/ML ~~LOC~~ SUSP: 10.0000 [IU] | Freq: Two times a day (BID) | SUBCUTANEOUS | Status: DC

## 2017-11-15 MED ORDER — ACETAMINOPHEN 325 MG PO TABS: 325.0000 mg | ORAL_TABLET | Freq: Four times a day (QID) | ORAL | Status: DC | PRN

## 2017-11-15 MED ORDER — LIDOCAINE HCL 1 % IJ SOLN
INTRAMUSCULAR | Status: AC
  Filled 2017-11-15: qty 20

## 2017-11-15 MED ORDER — ENOXAPARIN SODIUM 40 MG/0.4ML ~~LOC~~ SOLN
40.0000 mg | SUBCUTANEOUS | Status: DC
  Administered 2017-11-15 – 2017-11-18 (×4): 40 mg via SUBCUTANEOUS
  Filled 2017-11-15 (×4): qty 0.4

## 2017-11-15 NOTE — Procedures (Signed)
Ultrasound-guided diagnostic and therapeutic left thoracentesis performed yielding 1 liter of clear, light yellow colored fluid. No immediate complications. Follow-up chest x-ray pending. The fluid was sent to the lab for preordered studies.

## 2017-11-15 NOTE — Progress Notes (Signed)
  Echocardiogram 2D Echocardiogram has been performed.  Roosvelt MaserLane, Filippo Puls F 11/15/2017, 4:25 PM

## 2017-11-15 NOTE — Progress Notes (Signed)
Pt had 3 loose BM overnight. Going to check for CDiff. Increased to 12L overnight. Norco given for pain. Pt turns self in bed. POC reviewed with pt and husband. Will continue to monitor.

## 2017-11-15 NOTE — Evaluation (Signed)
SLP Cancellation Note  Patient Details Name: [name] MRN: [id number] DOB: 1965/12/16   Cancelled treatment:       Reason Eval/Treat Not Completed: Other (comment)(pt undergoing ECHO at this time, will continue efforts; RN denies dysphagia symptoms present)   [name], [name] 11/15/2017, 3:44 PM   Donavan Burnetamara Azavion Bouillon, MS [facility] SLP [phone]

## 2017-11-15 NOTE — Progress Notes (Signed)
PROGRESS NOTE    [name]  ZOX:[id number] DOB: Jun 02, 1966 DOA: 11/14/2017 PCP: Dois Davenport, MD   Brief Narrative: [name] is a 52 y.o. female with medical history significant of stage III CKD, status post renal transplant on immunosuppression, hypertension, hypothyroidism, type 2 diabetes mellitus insulin-dependent diabetes, GERD, recently admitted for cellulitis of the lower extremities and discharged on 8 March comes in for shortness of breath since discharge associated with left-sided chest pressure on deep inspiration and productive cough. Chest x-ray showed left-sided pneumonia.  It was followed with a CT chest without contrast showing multifocal pneumonia and moderate left pleural effusion.  She was referred to [facility] for evaluation and management of healthcare associated pneumonia.      Assessment & Plan:   Active Problems:   Immunosuppression (HCC)   Hypothyroidism   H/O kidney transplant   AKI (acute kidney injury) (HCC)   Essential hypertension   GERD (gastroesophageal reflux disease)   Diarrhea   Insulin dependent diabetes mellitus (HCC)   CKD (chronic kidney disease) stage 3, GFR 30-59 ml/min (HCC)   Anemia of chronic disease   Type II diabetes mellitus (HCC)   Status post kidney transplant   Pneumonia   HCAP (healthcare-associated pneumonia)   Acute respiratory failure with hypoxia secondary to multifocal pneumonia/ health care associated pneumonia and left sided mod effusion: Admitted to step down req high flow oxygen at 12/min.  S/p thoracentesis of 1 lit of clear light fluid and fluid sent for analysis.  Started on broad spectrum antibiotics and follow blood cultures.  Keep oxygen sats greater than 90%. Sputum cultures pending.  Urine for strep pneumonia negative.     Diarrhea, suspect antibiotic induced diarrhea.  c diff pcr is negative.  imodium can be added if diarrhea does not improve    Bilateral lower extremity cellulitis    Improving.    Suspect from Mild acute on chronic diastolic heart failure:   but Echo is unchanged from before.  S/p thoracentesis, fluid analysis sent.   History of stage III CKD, history of kidney transplant on chronic immunosuppression Baseline creatinine around 2.  Resume patient's home dose of Prograf and prednisone.   Anemia of chronic disease:  Stable around 10.   Type 2 DM, insulin dependent, and uncontrolled with hypoglycemia today.  Cut down the novolog to 10 units and to be given only if she eats 50% of her meals.    Hypertension:  Sub optimal, add hydralazine prn.        DVT prophylaxis: lovenox.  Code Status: full code.  Family Communication: none at bedside, called the family, couldn't leave a message.  Disposition Plan: pending resolution of respiratory failure  Consultants:   None.    Procedures:US thoracentesis.    Antimicrobials: vancomycin and cefepime since admission.    Subjective: Reports feeling, better, diarrhea has improved from admission.   Objective: Vitals:   11/15/17 0500 11/15/17 0600 11/15/17 0700 11/15/17 0800  BP:  (!) 155/53  (!) 178/70  Pulse: 74 70 75 83  Resp: (!) 23 18 17 20   Temp:    98.7 F (37.1 C)  TempSrc:    Oral  SpO2: 93% 100% 97% 95%  Weight:      Height:        Intake/Output Summary (Last 24 hours) at 11/15/2017 1412 Last data filed at 11/15/2017 0700 Gross per 24 hour  Intake -  Output 200 ml  Net -200 ml   Filed Weights   11/14/17 1913  Weight: 88.1 kg (194 lb 3.6 oz)    Examination:  General exam: on high flow oxygen at 12/min, in mild distress. Respiratory system: right sided rhonchi, air entry fair.  Cardiovascular system: S1 & S2 heard, RRR. No JVD, murmurs, rubs, gallops or clicks. No pedal edema. Gastrointestinal system: Abdomen is nondistended, soft and nontender. No organomegaly or masses felt. Normal bowel sounds heard. Central nervous system: Alert and oriented. No focal  neurological deficits. Extremities: lower extremity cellulitis changes improving.  Psychiatry: Judgement and insight appear normal. Mood & affect appropriate.     Data Reviewed: I have personally reviewed following labs and imaging studies  CBC: Recent Labs  Lab 11/09/17 0530 11/10/17 0528 11/12/17 0507 11/14/17 1313 11/15/17 0904  WBC 6.9 7.8 9.7 10.0 12.2*  NEUTROABS 5.3 6.3 8.1* 9.0* 10.7*  HGB 9.2* 8.9* 9.2* 10.1* 9.8*  HCT 30.0* 29.5* 29.5* 32.6* 31.7*  MCV 91.2 94.9 91.9 91.3 89.8  PLT 210 246 325 321 275   Basic Metabolic Panel: Recent Labs  Lab 11/09/17 0530 11/10/17 0528 11/11/17 0501 11/12/17 0507 11/14/17 1313 11/15/17 0904  NA 137 134* 132* 132* 136 139  K 3.7 4.8 4.5 5.1 5.0 4.2  CL 102 101 101 101 103 107  CO2 28 22 22 22 23 25   GLUCOSE 73 239* 26* 201* 290* 84  BUN 45* 48* 49* 49* 43* 37*  CREATININE 1.93* 2.10* 2.32* 2.42* 2.29* 2.02*  CALCIUM 8.7* 8.5* 8.6* 8.2* 8.6* 8.6*  MG 1.8 1.9  --   --   --   --   PHOS 2.1* 3.1  --   --   --   --    GFR: Estimated Creatinine Clearance: 38 mL/min (A) (by C-G formula based on SCr of 2.02 mg/dL (H)). Liver Function Tests: Recent Labs  Lab 11/09/17 0530 11/10/17 0528 11/14/17 1313  AST 13* 11* 16  ALT 9* 9* 11*  ALKPHOS 90 98 113  BILITOT 0.5 0.9 0.6  PROT 5.3* 5.2* 6.1*  ALBUMIN 1.7* 1.6* 1.8*   No results for input(s): LIPASE, AMYLASE in the last 168 hours. No results for input(s): AMMONIA in the last 168 hours. Coagulation Profile: Recent Labs  Lab 11/14/17 1313  INR 0.95   Cardiac Enzymes: No results for input(s): CKTOTAL, CKMB, CKMBINDEX, TROPONINI in the last 168 hours. BNP (last 3 results) No results for input(s): PROBNP in the last 8760 hours. HbA1C: No results for input(s): HGBA1C in the last 72 hours. CBG: Recent Labs  Lab 11/15/17 0900 11/15/17 1218 11/15/17 1222 11/15/17 1259 11/15/17 1354  GLUCAP 83 26* 28* 35* 75   Lipid Profile: No results for input(s): CHOL, HDL,  LDLCALC, TRIG, CHOLHDL, LDLDIRECT in the last 72 hours. Thyroid Function Tests: No results for input(s): TSH, T4TOTAL, FREET4, T3FREE, THYROIDAB in the last 72 hours. Anemia Panel: No results for input(s): VITAMINB12, FOLATE, FERRITIN, TIBC, IRON, RETICCTPCT in the last 72 hours. Sepsis Labs: Recent Labs  Lab 11/14/17 1332  LATICACIDVEN 1.02    Recent Results (from the past 240 hour(s))  Culture, blood (Routine X 2) w Reflex to ID Panel     Status: None   Collection Time: 11/07/17  2:59 PM  Result Value Ref Range Status   Specimen Description   Final    LEFT ANTECUBITAL Performed at [facility], [address] [place], [place] [address]    Special Requests   Final    IN PEDIATRIC BOTTLE Blood Culture adequate volume Performed at [facility], [address] [place], [place] [address]    Culture   Final    NO GROWTH 5 DAYS Performed at [facility], [address] [place], [place] [address]    Report Status 11/13/2017 FINAL  Final  Culture, blood (single)     Status: None   Collection Time: 11/07/17  3:12 PM  Result Value Ref Range Status   Specimen Description   Final    BLOOD LEFT HAND Performed at [facility], [address], [place], [place] [address]    Special Requests   Final    BOTTLES DRAWN AEROBIC ONLY Blood Culture adequate volume Performed at [facility], [address], [place], [place] [address]    Culture   Final    NO GROWTH 5 DAYS Performed at [facility], [address], [place], [place] [address]    Report Status 11/13/2017 FINAL  Final  Culture, blood (routine x 2)     Status: None (Preliminary result)   Collection Time: 11/14/17  1:13 PM  Result Value Ref Range Status   Specimen Description BLOOD RIGHT ANTECUBITAL  Final   Special Requests   Final    BOTTLES DRAWN AEROBIC AND ANAEROBIC Blood Culture adequate volume   Culture   Final    NO GROWTH < 24  HOURS Performed at [facility], [address] [place], [place] [address]    Report Status PENDING  Incomplete  Culture, blood (routine x 2)     Status: None (Preliminary result)   Collection Time: 11/14/17  5:20 PM  Result Value Ref Range Status   Specimen Description   Final    BLOOD LEFT ARM Performed at [facility] [address] [place], [place] [address]    Special Requests   Final    Blood Culture results may not be optimal due to an excessive volume of blood received in culture bottles Performed at [facility], [address] [place], [place] [address]    Culture   Final    NO GROWTH < 24 HOURS Performed at [facility], [address] [place], [place] [address]    Report Status PENDING  Incomplete  C difficile quick scan w PCR reflex     Status: None   Collection Time: 11/15/17  8:26 AM  Result Value Ref Range Status   C Diff antigen NEGATIVE NEGATIVE Final   C Diff toxin NEGATIVE NEGATIVE Final   C Diff interpretation No C. difficile detected.  Final    Comment: Performed at [facility], [address], [place], [place] [address]         Radiology Studies: Ct Chest Wo Contrast  Result Date: 11/14/2017 CLINICAL DATA:  Short of breath for several days.  No chest pain. EXAM: CT CHEST WITHOUT CONTRAST TECHNIQUE: Multidetector CT imaging of the chest was performed following the standard protocol without IV contrast. COMPARISON:  Current chest radiographs. FINDINGS: Cardiovascular: Heart is normal in size and configuration. No pericardial effusion. There are three-vessel coronary artery calcifications. Great vessels are normal in caliber. Mild aortic atherosclerotic calcifications. Mediastinum/Nodes: Hypoattenuating nodule from the inferior aspect of the thyroid measuring 2.5 cm. No neck base or axillary masses or pathologically enlarged lymph nodes. Several prominent mediastinal lymph nodes, largest  a left paratracheal node adjacent to the AP window measuring 14 mm in short axis. No mediastinal or hilar masses. No discrete enlarged or prominent hilar lymph nodes.  Trachea is normal in caliber and widely patent. Esophagus is unremarkable. Lungs/Pleura: Moderate left and small right pleural effusions. There are bilateral areas of patchy ground-glass and more confluent lung consolidation, most evident in the left upper lobe. Additional opacity is noted in the left lower lobe which is likely a combination of infection and atelectasis. There is a discrete pleural based nodule in the right lower lobe, image 127, series 7, measuring 9 mm. No pneumothorax. Upper Abdomen: Area of hypoattenuation noted in the liver adjacent to the falciform ligament consistent with focal fatty infiltration. There is marked atrophy of the visualize kidneys. Diffuse vascular calcifications are noted. No acute findings. Musculoskeletal: No fracture or acute finding. No osteoblastic or osteolytic lesions. There is surrounding subcutaneous soft tissue edema. IMPRESSION: 1. Findings are consistent with multifocal pneumonia, most evident in the left upper lobe. There are associated moderate left and small right pleural effusions. 2. Prominent to borderline enlarged mediastinal lymph nodes are presumed reactive given the changes in the lungs. 3. Coronary artery calcifications and mild aortic atherosclerosis. Aortic Atherosclerosis (ICD10-I70.0). Electronically Signed   By: Amie Portland M.D.   On: 11/14/2017 16:06   Dg Chest Port 1 View  Result Date: 11/15/2017 CLINICAL DATA:  Status post left thoracentesis. EXAM: PORTABLE CHEST 1 VIEW COMPARISON:  Radiograph of November 14, 2017. FINDINGS: The heart size and mediastinal contours are within normal limits. No pneumothorax is noted. Right lung is clear. Left pleural effusion noted on prior exam is nearly resolved. Mild ill-defined left midlung opacity is noted suggesting inflammation or  atelectasis. The visualized skeletal structures are unremarkable. IMPRESSION: No pneumothorax status post thoracentesis. Left pleural effusion is nearly resolved. Electronically Signed   By: Lupita Raider, M.D.   On: 11/15/2017 12:32   Dg Chest Port 1 View  Result Date: 11/14/2017 CLINICAL DATA:  Shortness of breath EXAM: PORTABLE CHEST 1 VIEW COMPARISON:  11/06/2017 FINDINGS: Extensive airspace disease on the left with some volume loss. Normal heart size when accounting for technique. The right lung is relatively clear. No definite effusion. No pneumothorax. IMPRESSION: Extensive left-sided airspace disease consistent with pneumonia in the clinical appropriate setting. Electronically Signed   By: Marnee Spring M.D.   On: 11/14/2017 13:33   US Thoracentesis Asp Pleural Space W/img Guide  Result Date: 11/15/2017 INDICATION: Patient with history of chronic kidney disease, pneumonia, dyspnea, cough, left pleural effusion. Request made for diagnostic and therapeutic left thoracentesis. EXAM: ULTRASOUND GUIDED DIAGNOSTIC AND THERAPEUTIC LEFT THORACENTESIS MEDICATIONS: 1% lidocaine local COMPLICATIONS: None immediate. PROCEDURE: An ultrasound guided thoracentesis was thoroughly discussed with the patient and questions answered. The benefits, risks, alternatives and complications were also discussed. The patient understands and wishes to proceed with the procedure. Written consent was obtained. Ultrasound was performed to localize and mark an adequate pocket of fluid in the left chest. The area was then prepped and draped in the normal sterile fashion. 1% Lidocaine was used for local anesthesia. Under ultrasound guidance a 6 Fr Safe-T-Centesis catheter was introduced. Thoracentesis was performed. The catheter was removed and a dressing applied. FINDINGS: A total of approximately 1 liter of clear, light yellow fluid was removed. Samples were sent to the laboratory as requested by the clinical team. IMPRESSION:  Successful ultrasound guided diagnostic and therapeutic left thoracentesis yielding 1 liter of pleural fluid. Read by: Jeananne Rama, PA-C Electronically Signed   By: Judie Petit.  Shick M.D.   On: 11/15/2017 12:19        Scheduled Meds: . carvedilol  25 mg  Oral BID WC  . enoxaparin (LOVENOX) injection  40 mg Subcutaneous Q24H  . insulin aspart protamine- aspart  10 Units Subcutaneous BID WC  . levothyroxine  137 mcg Oral QAC breakfast  . lidocaine      . mycophenolate  500 mg Oral BID  . nystatin   Topical TID  . predniSONE  5 mg Oral Daily  . sodium bicarbonate  1,300 mg Oral BID  . tacrolimus  0.5 mg Oral QAC breakfast  . tacrolimus  1 mg Oral Q2000  . vitamin B-12  100 mcg Oral Daily   Continuous Infusions: . ceFEPime (MAXIPIME) IV Stopped (11/15/17 1213)  . [START ON 11/16/2017] vancomycin       LOS: 1 day    Time spent: 40 min    Kathlen ModyVijaya Luismanuel Corman, MD [facility] Pager [phone]  If 7PM-7AM, please contact night-coverage [email or web address] Password [email or web address] 11/15/2017, 2:12 PM

## 2017-11-16 ENCOUNTER — Inpatient Hospital Stay (HOSPITAL_COMMUNITY): Payer: Medicare Other

## 2017-11-16 DIAGNOSIS — I509 Heart failure, unspecified: Secondary | ICD-10-CM

## 2017-11-16 LAB — CBC
HEMATOCRIT: 28.5 % — AB (ref 36.0–46.0)
HEMOGLOBIN: 8.9 g/dL — AB (ref 12.0–15.0)
MCH: 28.2 pg (ref 26.0–34.0)
MCHC: 31.2 g/dL (ref 30.0–36.0)
MCV: 90.2 fL (ref 78.0–100.0)
Platelets: 188 10*3/uL (ref 150–400)
RBC: 3.16 MIL/uL — ABNORMAL LOW (ref 3.87–5.11)
RDW: 14.7 % (ref 11.5–15.5)
WBC: 6.5 10*3/uL (ref 4.0–10.5)

## 2017-11-16 LAB — MRSA PCR SCREENING: MRSA BY PCR: POSITIVE — AB

## 2017-11-16 LAB — BASIC METABOLIC PANEL
Anion gap: 6 (ref 5–15)
BUN: 29 mg/dL — ABNORMAL HIGH (ref 6–20)
CHLORIDE: 104 mmol/L (ref 101–111)
CO2: 24 mmol/L (ref 22–32)
CREATININE: 1.85 mg/dL — AB (ref 0.44–1.00)
Calcium: 8.1 mg/dL — ABNORMAL LOW (ref 8.9–10.3)
GFR calc non Af Amer: 31 mL/min — ABNORMAL LOW (ref >60)
GFR, EST AFRICAN AMERICAN: 36 mL/min — AB (ref >60)
GLUCOSE: 202 mg/dL — AB (ref 65–99)
Potassium: 4.6 mmol/L (ref 3.5–5.1)
Sodium: 134 mmol/L — ABNORMAL LOW (ref 135–145)

## 2017-11-16 LAB — GLUCOSE, CAPILLARY
GLUCOSE-CAPILLARY: 26 mg/dL — AB (ref 65–99)
GLUCOSE-CAPILLARY: 249 mg/dL — AB (ref 65–99)
GLUCOSE-CAPILLARY: 267 mg/dL — AB (ref 65–99)
GLUCOSE-CAPILLARY: 324 mg/dL — AB (ref 65–99)
GLUCOSE-CAPILLARY: 87 mg/dL (ref 65–99)

## 2017-11-16 MED ORDER — INSULIN ASPART 100 UNIT/ML ~~LOC~~ SOLN
0.0000 [IU] | Freq: Three times a day (TID) | SUBCUTANEOUS | Status: DC
  Administered 2017-11-16: 11 [IU] via SUBCUTANEOUS
  Administered 2017-11-18: 2 [IU] via SUBCUTANEOUS
  Administered 2017-11-18 (×2): 3 [IU] via SUBCUTANEOUS
  Administered 2017-11-19: 15 [IU] via SUBCUTANEOUS
  Administered 2017-11-19: 8 [IU] via SUBCUTANEOUS

## 2017-11-16 MED ORDER — VANCOMYCIN HCL IN DEXTROSE 750-5 MG/150ML-% IV SOLN
750.0000 mg | INTRAVENOUS | Status: DC
  Administered 2017-11-16 – 2017-11-19 (×4): 750 mg via INTRAVENOUS
  Filled 2017-11-16 (×4): qty 150

## 2017-11-16 MED ORDER — MAGIC MOUTHWASH W/LIDOCAINE
5.0000 mL | Freq: Three times a day (TID) | ORAL | Status: DC | PRN
  Administered 2017-11-16 – 2017-11-17 (×2): 5 mL via ORAL
  Filled 2017-11-16 (×2): qty 5

## 2017-11-16 MED ORDER — DIPHENOXYLATE-ATROPINE 2.5-0.025 MG PO TABS
1.0000 | ORAL_TABLET | Freq: Three times a day (TID) | ORAL | Status: DC
  Administered 2017-11-16 – 2017-11-19 (×10): 1 via ORAL
  Filled 2017-11-16 (×10): qty 1

## 2017-11-16 MED ORDER — DIPHENOXYLATE-ATROPINE 2.5-0.025 MG/5ML PO LIQD: 5.0000 mL | Freq: Three times a day (TID) | ORAL | Status: DC

## 2017-11-16 MED ORDER — HYDROCODONE-HOMATROPINE 5-1.5 MG/5ML PO SYRP
5.0000 mL | ORAL_SOLUTION | Freq: Four times a day (QID) | ORAL | Status: DC
  Administered 2017-11-16 – 2017-11-19 (×12): 5 mL via ORAL
  Filled 2017-11-16 (×12): qty 5

## 2017-11-16 NOTE — Progress Notes (Signed)
Pharmacy Antibiotic Note  [name] is a 52 y.o. female admitted on 11/14/2017 with pneumonia.  Recent hospitalization (discharged 11/12/17)  where treated for bilateral lower extremity with cellulitis (discharged on doxycycline).  PMH significant for HTN, DM, CKD s/p renal transplant.  Chest Xray concerning for pneumonia.  Pharmacy has been consulted for Vancomycin dosing.  Today, 11/16/2017 Day #3 Vancomycin/Cefepime SCr improved 2.29 > 2.02 > 1.85 WBC improved 6.5 Afebrile All cultures negative to date  Plan: - Adjust vancomycin to 750mg  IV q24h (estimated AUC 473) - Cefepime adjusted per renal dose adjustment protocol to 1gm IV q12h (for CrCl 32 ml/min) - Follow SCr - F/U cultures/sensitivities  Height: 5\' 7"  (170.2 cm) Weight: 194 lb 3.6 oz (88.1 kg) IBW/kg (Calculated) : 61.6  Temp (24hrs), Avg:98.2 F (36.8 C), Min:97.1 F (36.2 C), Max:99.4 F (37.4 C)  Recent Labs  Lab 11/10/17 0528 11/11/17 0501 11/12/17 0507 11/14/17 1313 11/14/17 1332 11/15/17 0904 11/16/17 0327  WBC 7.8  --  9.7 10.0  --  12.2* 6.5  CREATININE 2.10* 2.32* 2.42* 2.29*  --  2.02* 1.85*  LATICACIDVEN  --   --   --   --  1.02  --   --     Estimated Creatinine Clearance: 41.5 mL/min (A) (by C-G formula based on SCr of 1.85 mg/dL (H)).    Allergies  Allergen Reactions  . Latex Itching  . Ace Inhibitors Cough  . Adhesive [Tape] Itching and Other (See Comments)    Please use paper tape  . Lisinopril Cough    Antimicrobials this admission:  3/10 cefepime >>   3/10 vanc >>    Dose adjustments this admission:  3/12 increase vanc from 1250mg  q48h to 750mg  q24h for improved SCr  Microbiology results:  3/10 BCx: ngtd 3/10 UCx: NGF 3/11 C.diff: neg 3/11 Urine strep Ag: neg  Thank you for allowing pharmacy to be a part of this patient's care.  Loralee PacasErin Janus Vlcek, PharmD, BCPS Pager: [phone] 11/16/2017 9:20 AM

## 2017-11-16 NOTE — Progress Notes (Addendum)
PROGRESS NOTE    [name]  WUJ:[id number] DOB: [birth year] DOA: 11/14/2017 PCP: Dois Davenportichter, Karen L, MD   Brief Narrative: [name] is a 52 y.o. female with medical history significant of stage III CKD, status post renal transplant on immunosuppression, hypertension, hypothyroidism, type 2 diabetes mellitus insulin-dependent diabetes, GERD, recently admitted for cellulitis of the lower extremities and discharged on 8 March comes in for shortness of breath since discharge associated with left-sided chest pressure on deep inspiration and productive cough. Chest x-ray showed left-sided pneumonia. CT scan showed multifocal pneumonia with moderate left-sided pleural effusion. Patient underwent left thoracentesis on 3/11 yielding 1 L of transudative effusion.  [facility] and management of healthcare associated pneumonia.  Assessment & Plan:   Active Problems:   Immunosuppression (HCC)   Hypothyroidism   H/O kidney transplant   AKI (acute kidney injury) (HCC)   Essential hypertension   GERD (gastroesophageal reflux disease)   Diarrhea   Insulin dependent diabetes mellitus (HCC)   CKD (chronic kidney disease) stage 3, GFR 30-59 ml/min (HCC)   Anemia of chronic disease   Type II diabetes mellitus (HCC)   Status post kidney transplant   Pneumonia   HCAP (healthcare-associated pneumonia)   Acute respiratory failure with hypoxia secondary to multifocal pneumonia/ health care associated pneumonia and left sided mod effusion: Initially Admitted to step down req high flow oxygen at 12/min.  S/p thoracentesis of 1 lit of clear light fluid and fluid sent for analysis. Transudative in nature Oxygen requirements have improved, now down to 4 L  Because she is immunocompromised, she was Started on broad spectrum antibiotics . Blood cultures from 3/10 no growth so far Keep oxygen sats greater than 90%. Urine for strep pneumonia negative.  Added Hycodan for the  cough     Diarrhea, suspect antibiotic induced diarrhea.  c diff pcr is negative.  imodium can be added if diarrhea does not improve She has a rectal tube in place and the bag is empty. Will start patient on Imodium and see if  the rectal tube can be removed   Bilateral lower extremity cellulitis  Continue antibiotics as above, improving Venous Doppler on 11/05/17 showed an enlarged lymph node in the groin on the right and left, without any evidence of deep venous thrombosis of the lower extremities.  Probable acute on chronic diastolic heart failure:  BNP of 958, point-of-care troponin negative .2-D Echo done on 3/11 showed EF of 60-65% with no regional wall motion abnormalities, grade 2 diastolic dysfunction. . No significant change since 04/2016 S/p thoracentesis, fluid analysis sent.   History of stage III CKD, history of kidney transplant on chronic immunosuppression Baseline creatinine around 2.  Continue patient's home dose of Prograf and prednisone. Creatinine around 1.85   Anemia of chronic disease:  Stable around 10. Hemoglobin down to 8.9, platelets 188, follow CBC closely   Type 2 DM, insulin dependent,  very labile Currently on NovoLog 70/30, 10 units twice a day.  Follow Accu-Cheks closely Most recent hemoglobin A1c on 11/11/17 was 8.9   Hypertension:  Sub optimal, add hydralazine prn.        DVT prophylaxis: lovenox.  Code Status: full code.  Family Communication: none at bedside, called the family, couldn't leave a message.  Disposition Plan:  We'll transfer patient to telemetry, PT OT evaluation, anticipate discharge when patient able to participate with therapy   Consultants:   None.    Procedures:US thoracentesis.    Antimicrobials:  vancomycin and cefepime since admission.    Subjective:  Patient has a rectal tube in place, complains of diarrhea, no nausea no vomiting. She has a severe cough, unable to sleep at night  Objective: Vitals:    11/16/17 0006 11/16/17 0354 11/16/17 0400 11/16/17 0800  BP:   (!) 148/52   Pulse:   72   Resp:   19   Temp: 99.4 F (37.4 C) 98.1 F (36.7 C)  (!) 97.1 F (36.2 C)  TempSrc: Axillary Oral  Oral  SpO2:   90%   Weight:      Height:        Intake/Output Summary (Last 24 hours) at 11/16/2017 0810 Last data filed at 11/15/2017 1800 Gross per 24 hour  Intake 200 ml  Output -  Net 200 ml   Filed Weights   11/14/17 1913  Weight: 88.1 kg (194 lb 3.6 oz)    Examination:  General exam: on high flow oxygen at 12/min, in mild distress. Respiratory system: right sided rhonchi, air entry fair.  Cardiovascular system: S1 & S2 heard, RRR. No JVD, murmurs, rubs, gallops or clicks. No pedal edema. Gastrointestinal system: Abdomen is nondistended, soft and nontender. No organomegaly or masses felt. Normal bowel sounds heard. Central nervous system: Alert and oriented. No focal neurological deficits. Extremities: lower extremity cellulitis changes improving.  Psychiatry: Judgement and insight appear normal. Mood & affect appropriate.     Data Reviewed: I have personally reviewed following labs and imaging studies  CBC: Recent Labs  Lab 11/10/17 0528 11/12/17 0507 11/14/17 1313 11/15/17 0904 11/16/17 0327  WBC 7.8 9.7 10.0 12.2* 6.5  NEUTROABS 6.3 8.1* 9.0* 10.7*  --   HGB 8.9* 9.2* 10.1* 9.8* 8.9*  HCT 29.5* 29.5* 32.6* 31.7* 28.5*  MCV 94.9 91.9 91.3 89.8 90.2  PLT 246 325 321 275 188   Basic Metabolic Panel: Recent Labs  Lab 11/10/17 0528 11/11/17 0501 11/12/17 0507 11/14/17 1313 11/15/17 0904 11/16/17 0327  NA 134* 132* 132* 136 139 134*  K 4.8 4.5 5.1 5.0 4.2 4.6  CL 101 101 101 103 107 104  CO2 22 22 22 23 25 24   GLUCOSE 239* 26* 201* 290* 84 202*  BUN 48* 49* 49* 43* 37* 29*  CREATININE 2.10* 2.32* 2.42* 2.29* 2.02* 1.85*  CALCIUM 8.5* 8.6* 8.2* 8.6* 8.6* 8.1*  MG 1.9  --   --   --   --   --   PHOS 3.1  --   --   --   --   --    GFR: Estimated Creatinine  Clearance: 41.5 mL/min (A) (by C-G formula based on SCr of 1.85 mg/dL (H)). Liver Function Tests: Recent Labs  Lab 11/10/17 0528 11/14/17 1313  AST 11* 16  ALT 9* 11*  ALKPHOS 98 113  BILITOT 0.9 0.6  PROT 5.2* 6.1*  ALBUMIN 1.6* 1.8*   No results for input(s): LIPASE, AMYLASE in the last 168 hours. No results for input(s): AMMONIA in the last 168 hours. Coagulation Profile: Recent Labs  Lab 11/14/17 1313  INR 0.95   Cardiac Enzymes: No results for input(s): CKTOTAL, CKMB, CKMBINDEX, TROPONINI in the last 168 hours. BNP (last 3 results) No results for input(s): PROBNP in the last 8760 hours. HbA1C: No results for input(s): HGBA1C in the last 72 hours. CBG: Recent Labs  Lab 11/15/17 1259 11/15/17 1354 11/15/17 1640 11/15/17 2123 11/16/17 0733  GLUCAP 35* 75 75 121* 249*   Lipid Profile: No results for input(s): CHOL, HDL,  LDLCALC, TRIG, CHOLHDL, LDLDIRECT in the last 72 hours. Thyroid Function Tests: No results for input(s): TSH, T4TOTAL, FREET4, T3FREE, THYROIDAB in the last 72 hours. Anemia Panel: No results for input(s): VITAMINB12, FOLATE, FERRITIN, TIBC, IRON, RETICCTPCT in the last 72 hours. Sepsis Labs: Recent Labs  Lab 11/14/17 1332  LATICACIDVEN 1.02    Recent Results (from the past 240 hour(s))  Culture, blood (Routine X 2) w Reflex to ID Panel     Status: None   Collection Time: 11/07/17  2:59 PM  Result Value Ref Range Status   Specimen Description   Final    LEFT ANTECUBITAL Performed at [facility], [address], [place], [place] [address]    Special Requests   Final    IN PEDIATRIC BOTTLE Blood Culture adequate volume Performed at [facility], [address], [place], [place] [address]    Culture   Final    NO GROWTH 5 DAYS Performed at [facility], [address], [place], [place] [address]    Report Status 11/13/2017 FINAL  Final  Culture, blood (single)     Status: None   Collection  Time: 11/07/17  3:12 PM  Result Value Ref Range Status   Specimen Description   Final    BLOOD LEFT HAND Performed at [facility], [address] [place], [place] [address]    Special Requests   Final    BOTTLES DRAWN AEROBIC ONLY Blood Culture adequate volume Performed at [facility], [address] [place], [place] [address]    Culture   Final    NO GROWTH 5 DAYS Performed at [facility], [address], [place], [place] [address]    Report Status 11/13/2017 FINAL  Final  Culture, blood (routine x 2)     Status: None (Preliminary result)   Collection Time: 11/14/17  1:13 PM  Result Value Ref Range Status   Specimen Description BLOOD RIGHT ANTECUBITAL  Final   Special Requests   Final    BOTTLES DRAWN AEROBIC AND ANAEROBIC Blood Culture adequate volume   Culture   Final    NO GROWTH < 24 HOURS Performed at [facility], [address] [place], [place] [address]    Report Status PENDING  Incomplete  Urine culture     Status: None   Collection Time: 11/14/17  2:25 PM  Result Value Ref Range Status   Specimen Description   Final    URINE, CLEAN CATCH Performed at [facility], [address], [place], [place] [address]    Special Requests   Final    NONE Performed at [facility], [address], [place], [place] [address]    Culture   Final    NO GROWTH Performed at [facility], [address], [place], [place] [address]    Report Status 11/15/2017 FINAL  Final  Culture, blood (routine x 2)     Status: None (Preliminary result)   Collection Time: 11/14/17  5:20 PM  Result Value Ref Range Status   Specimen Description   Final    BLOOD LEFT ARM Performed at [facility], [address] [place], [place] [address]    Special Requests   Final    Blood Culture results may not be optimal due to an excessive volume of blood received in culture  bottles Performed at [facility], [address] [place],  [place] [address]    Culture   Final    NO GROWTH < 24 HOURS Performed at [facility], [address], [place], [place] [address]    Report Status PENDING  Incomplete  C difficile quick scan w PCR reflex     Status: None   Collection Time: 11/15/17  8:26 AM  Result Value Ref Range Status   C Diff antigen NEGATIVE NEGATIVE Final   C Diff toxin NEGATIVE NEGATIVE Final   C Diff interpretation No C. difficile detected.  Final    Comment: Performed at [name], [address], [place], [place] [address]         Radiology Studies: Ct Chest Wo Contrast  Result Date: 11/14/2017 CLINICAL DATA:  Short of breath for several days.  No chest pain. EXAM: CT CHEST WITHOUT CONTRAST TECHNIQUE: Multidetector CT imaging of the chest was performed following the standard protocol without IV contrast. COMPARISON:  Current chest radiographs. FINDINGS: Cardiovascular: Heart is normal in size and configuration. No pericardial effusion. There are three-vessel coronary artery calcifications. Great vessels are normal in caliber. Mild aortic atherosclerotic calcifications. Mediastinum/Nodes: Hypoattenuating nodule from the inferior aspect of the thyroid measuring 2.5 cm. No neck base or axillary masses or pathologically enlarged lymph nodes. Several prominent mediastinal lymph nodes, largest a left paratracheal node adjacent to the AP window measuring 14 mm in short axis. No mediastinal or hilar masses. No discrete enlarged or prominent hilar lymph nodes. Trachea is normal in caliber and widely patent. Esophagus is unremarkable. Lungs/Pleura: Moderate left and small right pleural effusions. There are bilateral areas of patchy ground-glass and more confluent lung consolidation, most evident in the left upper lobe. Additional opacity is noted in the left lower lobe which is likely a combination of infection and  atelectasis. There is a discrete pleural based nodule in the right lower lobe, image 127, series 7, measuring 9 mm. No pneumothorax. Upper Abdomen: Area of hypoattenuation noted in the liver adjacent to the falciform ligament consistent with focal fatty infiltration. There is marked atrophy of the visualize kidneys. Diffuse vascular calcifications are noted. No acute findings. Musculoskeletal: No fracture or acute finding. No osteoblastic or osteolytic lesions. There is surrounding subcutaneous soft tissue edema. IMPRESSION: 1. Findings are consistent with multifocal pneumonia, most evident in the left upper lobe. There are associated moderate left and small right pleural effusions. 2. Prominent to borderline enlarged mediastinal lymph nodes are presumed reactive given the changes in the lungs. 3. Coronary artery calcifications and mild aortic atherosclerosis. Aortic Atherosclerosis (ICD10-I70.0). Electronically Signed   By: Amie Portland M.D.   On: 11/14/2017 16:06   Dg Chest Port 1 View  Result Date: 11/15/2017 CLINICAL DATA:  Status post left thoracentesis. EXAM: PORTABLE CHEST 1 VIEW COMPARISON:  Radiograph of November 14, 2017. FINDINGS: The heart size and mediastinal contours are within normal limits. No pneumothorax is noted. Right lung is clear. Left pleural effusion noted on prior exam is nearly resolved. Mild ill-defined left midlung opacity is noted suggesting inflammation or atelectasis. The visualized skeletal structures are unremarkable. IMPRESSION: No pneumothorax status post thoracentesis. Left pleural effusion is nearly resolved. Electronically Signed   By: Lupita Raider, M.D.   On: 11/15/2017 12:32   Dg Chest Port 1 View  Result Date: 11/14/2017 CLINICAL DATA:  Shortness of breath EXAM: PORTABLE CHEST 1 VIEW COMPARISON:  11/06/2017 FINDINGS: Extensive airspace disease on the left with some volume loss. Normal heart size when accounting for technique. The right lung  is relatively clear. No  definite effusion. No pneumothorax. IMPRESSION: Extensive left-sided airspace disease consistent with pneumonia in the clinical appropriate setting. Electronically Signed   By: Marnee Spring M.D.   On: 11/14/2017 13:33   US Thoracentesis Asp Pleural Space W/img Guide  Result Date: 11/15/2017 INDICATION: Patient with history of chronic kidney disease, pneumonia, dyspnea, cough, left pleural effusion. Request made for diagnostic and therapeutic left thoracentesis. EXAM: ULTRASOUND GUIDED DIAGNOSTIC AND THERAPEUTIC LEFT THORACENTESIS MEDICATIONS: 1% lidocaine local COMPLICATIONS: None immediate. PROCEDURE: An ultrasound guided thoracentesis was thoroughly discussed with the patient and questions answered. The benefits, risks, alternatives and complications were also discussed. The patient understands and wishes to proceed with the procedure. Written consent was obtained. Ultrasound was performed to localize and mark an adequate pocket of fluid in the left chest. The area was then prepped and draped in the normal sterile fashion. 1% Lidocaine was used for local anesthesia. Under ultrasound guidance a 6 Fr Safe-T-Centesis catheter was introduced. Thoracentesis was performed. The catheter was removed and a dressing applied. FINDINGS: A total of approximately 1 liter of clear, light yellow fluid was removed. Samples were sent to the laboratory as requested by the clinical team. IMPRESSION: Successful ultrasound guided diagnostic and therapeutic left thoracentesis yielding 1 liter of pleural fluid. Read by: Jeananne Rama, PA-C Electronically Signed   By: Judie Petit.  Shick M.D.   On: 11/15/2017 12:19        Scheduled Meds: . carvedilol  25 mg Oral BID WC  . enoxaparin (LOVENOX) injection  40 mg Subcutaneous Q24H  . insulin aspart protamine- aspart  10 Units Subcutaneous BID WC  . levothyroxine  137 mcg Oral QAC breakfast  . mycophenolate  500 mg Oral BID  . nystatin   Topical TID  . predniSONE  5 mg Oral Daily  .  sodium bicarbonate  1,300 mg Oral BID  . tacrolimus  0.5 mg Oral QAC breakfast  . tacrolimus  1 mg Oral Q2000  . vitamin B-12  100 mcg Oral Daily   Continuous Infusions: . ceFEPime (MAXIPIME) IV Stopped (11/15/17 2206)  . vancomycin       LOS: 2 days    Time spent: 40 min    Richarda Overlie, MD [facility]    If 7PM-7AM, please contact night-coverage [email or web address] Password [email or web address] 11/16/2017, 8:10 AM

## 2017-11-16 NOTE — Consult Note (Signed)
Modified Barium Swallow Progress Note  Patient Details  Name: [name] MRN: [id number] Date of Birth: December 28, 1965  Today's Date: 11/16/2017  Modified Barium Swallow completed.  Full report located under Chart Review in the Imaging Section.  Brief recommendations include the following:  Clinical Impression Pt presents with functional oral stage swallow, however, she currently has several sores in her oral cavity which are causing discomfort with chewing solids. Her dentition is poor.   Pharyngeally, pt exhibits delayed swallow reflex across consistencies, with trigger noted at the vallecular sinus. Trace penetration of thin liquids was noted during the swallow. No aspiration was seen, however, pt is at increased risk over time. Chin tuck position with thin liquids, facilitated by straight (unbent) straw, eliminated penetration of thin liquids, even with large and consecutive boluses.   Given oral pain and pt request, will change diet to dys 2 with additional sauce/gravy. Thin liquids ok via straw with chin tuck. Safe swallow precautions were sent with pt back to her room. ST wil follow for assessment of diet tolerance and education. Anticipate being able to advance solids at bedside as oral pain is decreased.    Swallow Evaluation Recommendations  SLP Diet Recommendations: Dysphagia 2 (Fine chop) solids;Thin liquid   Liquid Administration via: Straw(unbent, to facilitate chin tuck)   Medication Administration: Whole meds with liquid(1 @ a time)   Supervision: Patient able to self feed   Compensations: Minimize environmental distractions;Slow rate;Small sips/bites;Use straw to facilitate chin tuck;Chin tuck   Postural Changes: Seated upright at 90 degrees   Oral Care Recommendations: Oral care QID     Maxine Huynh B. Murvin NatalBueche, [facility] CenterMSP, CCC-SLP Speech Language Pathologist [phone]  Leigh AuroraBueche, Patrik Turnbaugh Brown 11/16/2017,3:50 PM

## 2017-11-16 NOTE — Evaluation (Signed)
Clinical/Bedside Swallow Evaluation Patient Details  Name: [name] MRN: [id number] Date of Birth: 02-19-1966  Today's Date: 11/16/2017 Time: SLP Start Time (ACUTE ONLY): 1050 SLP Stop Time (ACUTE ONLY): 1115 SLP Time Calculation (min) (ACUTE ONLY): 25 min  Past Medical History:  Past Medical History:  Diagnosis Date  . Arthritis    "elbows, knees, legs, back" (09/24/2015)  . CKD (chronic kidney disease)   . Daily headache   . Depression    "years ago"  . End stage renal disease (HCC)    right arm AV graft, resolved post transplant  . Hypertension   . Hypothyroid   . 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 Kidney disease 2011  . Pneumonia ~ 2007?  [name]. Type II diabetes mellitus (HCC)    Insulin dependant   Past Surgical History:  Past Surgical History:  Procedure Laterality Date  . AMPUTATION Left 05/11/2014   Procedure: AMPUTATION LEFT GREAT TOE;  Surgeon: Kathryne Hitchhristopher Y Blackman, MD;  Location: [facility];  Service: Orthopedics;  Laterality: Left;  . AV FISTULA PLACEMENT Right    forearm  . BACK SURGERY    . CATARACT EXTRACTION W/ INTRAOCULAR LENS  IMPLANT, BILATERAL Bilateral   . CESAREAN SECTION  07/1999  . DG AV DIALYSIS GRAFT DECLOT OR    . HIP PINNING,CANNULATED Right 06/02/2017   Procedure: CANNULATED RIGHT HIP PINNING;  Surgeon: Beverely LowNorris, Steve, MD;  Location: [facility];  Service: Orthopedics;  Laterality: Right;  . INCISION AND DRAINAGE Right 06/02/2015   Procedure: INCISION AND DRAINAGE OF RIGHT 3rd RAY RESECTION;  Surgeon: Kathryne Hitchhristopher Y Blackman, MD;  Location: [facility] OR;  Service: Orthopedics;  Laterality: Right;  . 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 LUMBAR DISC SURGERY  2001  . NEPHRECTOMY TRANSPLANTED ORGAN    . TOE AMPUTATION Right    1,2 & 3rd toes.  . TUBAL LIGATION  07/1999   HPI:  52 [facility].  PMH + for renal transplant, ESRD, recurrent pnas, prior smoker.  CXR indicated LUL pna - pt is s/p thoracentesis today.   Swallow evaluation ordered.    Assessment / Plan / Recommendation Clinical Impression  Pt presents with poor/missing dentition, and several sores throughout oral cavity. She reports no history of swallowing difficulty prior to admit, however, she thinks she has had several bouts of pneumonia over the last few years. Will change diet to dys 3 with chopped meats, extra gravy/sauce for moisture. Continue thin liquids, meds per pt tolerance. ST will continue to follow for assessment of diet tolerance and education.   Completion of objective study (MBS) may be beneficial to rule out silent aspiration, if lungs do not clear up in a timely manner.     SLP Visit Diagnosis: Dysphagia, unspecified (R13.10)    Aspiration Risk  Mild aspiration risk    Diet Recommendation Dysphagia 3 (Mech soft);Thin liquid   Liquid Administration via: Cup;Straw Medication Administration: Whole meds with liquid Supervision: Patient able to self feed;Intermittent supervision to cue for compensatory strategies Compensations: Minimize environmental distractions;Slow rate;Small sips/bites Postural Changes: Seated upright at 90 degrees;Remain upright for at least 30 minutes after po intake    Other  Recommendations Oral Care Recommendations: Oral care BID   Follow up Recommendations (TBD)      Frequency and Duration min 1 x/week  2 weeks       Prognosis Prognosis for Safe Diet Advancement: Good      Swallow Study  General Date of Onset: 11/14/17 HPI: 52 yo female adm to [facility] with HCAP.  PMH + for renal transplant, ESRD, recurrent pnas, prior smoker.  CXR indicated LUL pna - pt is s/p thoracentesis today.  Swallow evaluation ordered.  Type of Study: Bedside Swallow Evaluation Diet Prior to this Study: Regular;Thin liquids Temperature Spikes Noted: No Respiratory Status: Nasal cannula History of Recent Intubation: No Behavior/Cognition: Alert;Cooperative;Pleasant mood Oral Cavity Assessment: Other  (comment)(pt reports sores in oral cavity) Oral Care Completed by SLP: No Oral Cavity - Dentition: Poor condition;Missing dentition Vision: Functional for self-feeding Self-Feeding Abilities: Able to feed self Patient Positioning: Upright in bed Baseline Vocal Quality: Normal Volitional Cough: Strong;Congested Volitional Swallow: Able to elicit    Oral/Motor/Sensory Function Overall Oral Motor/Sensory Function: Within functional limits   Ice Chips Ice chips: Not tested   Thin Liquid Thin Liquid: Within functional limits Presentation: Straw    Nectar Thick Nectar Thick Liquid: Not tested   Honey Thick Honey Thick Liquid: Not tested   Puree Puree: Within functional limits Presentation: Self Fed;Spoon   Solid   GO   Solid: Not tested(pt declined due to sores in mouth)       Rosaland Shiffman B. Murvin Natal, [facility], CCC-SLP Speech Language Pathologist [phone]  Leigh Aurora 11/16/2017,11:18 AM

## 2017-11-16 NOTE — Plan of Care (Signed)
  Progressing Education: Knowledge of General Education information will improve 11/16/2017 2256 - Progressing by Cristela FeltSteffens, Tamia Dial P, RN Health Behavior/Discharge Planning: Ability to manage health-related needs will improve 11/16/2017 2256 - Progressing by Cristela FeltSteffens, Nereida Schepp P, RN Nutrition: Adequate nutrition will be maintained 11/16/2017 2256 - Progressing by Cristela FeltSteffens, Cathryne Mancebo P, RN Elimination: Will not experience complications related to urinary retention 11/16/2017 2256 - Progressing by Cristela FeltSteffens, Beatrice Sehgal P, RN Pain Managment: General experience of comfort will improve 11/16/2017 2256 - Progressing by Cristela FeltSteffens, Brissia Delisa P, RN Safety: Ability to remain free from injury will improve 11/16/2017 2256 - Progressing by Cristela FeltSteffens, Assyria Morreale P, RN

## 2017-11-16 NOTE — Consult Note (Signed)
WOC consulted for weeping in the LLE, she has been evaluated twice by my WOC nurse partner. She has had ABI which indicate non compressible vessels and with her history of amputations I do not feel that compression therapy would be beneficial for this patient in the setting of most likely mixed venous and arterial etiology. Keep legs elevated and use kerlix for mild weeping.    If team considering compression would need vascular evaluation to determine safety and long term follow up for the use of low level compression if safe to prevent weeping.   Discussed POC with patient and bedside nurse.  Re consult if needed, will not follow at this time. Thanks  Florentino Laabs M.D.C. [employer] MSN, RN,CWOCN, CNS, CWON-AP [phone])

## 2017-11-17 LAB — CBC
HEMATOCRIT: 28.8 % — AB (ref 36.0–46.0)
Hemoglobin: 8.8 g/dL — ABNORMAL LOW (ref 12.0–15.0)
MCH: 27.8 pg (ref 26.0–34.0)
MCHC: 30.6 g/dL (ref 30.0–36.0)
MCV: 91.1 fL (ref 78.0–100.0)
Platelets: 162 10*3/uL (ref 150–400)
RBC: 3.16 MIL/uL — ABNORMAL LOW (ref 3.87–5.11)
RDW: 14.7 % (ref 11.5–15.5)
WBC: 6.1 10*3/uL (ref 4.0–10.5)

## 2017-11-17 LAB — GLUCOSE, CAPILLARY
GLUCOSE-CAPILLARY: 91 mg/dL (ref 65–99)
GLUCOSE-CAPILLARY: 120 mg/dL — AB (ref 65–99)
Glucose-Capillary: 79 mg/dL (ref 65–99)
Glucose-Capillary: 87 mg/dL (ref 65–99)

## 2017-11-17 LAB — COMPREHENSIVE METABOLIC PANEL
ALT: 11 U/L — ABNORMAL LOW (ref 14–54)
ANION GAP: 7 (ref 5–15)
AST: 14 U/L — AB (ref 15–41)
Albumin: 1.6 g/dL — ABNORMAL LOW (ref 3.5–5.0)
Alkaline Phosphatase: 87 U/L (ref 38–126)
BUN: 26 mg/dL — AB (ref 6–20)
CO2: 24 mmol/L (ref 22–32)
Calcium: 8.3 mg/dL — ABNORMAL LOW (ref 8.9–10.3)
Chloride: 105 mmol/L (ref 101–111)
Creatinine, Ser: 1.66 mg/dL — ABNORMAL HIGH (ref 0.44–1.00)
GFR calc Af Amer: 41 mL/min — ABNORMAL LOW (ref >60)
GFR calc non Af Amer: 35 mL/min — ABNORMAL LOW (ref >60)
Glucose, Bld: 92 mg/dL (ref 65–99)
POTASSIUM: 4.2 mmol/L (ref 3.5–5.1)
Sodium: 136 mmol/L (ref 135–145)
TOTAL PROTEIN: 5.5 g/dL — AB (ref 6.5–8.1)
Total Bilirubin: 0.4 mg/dL (ref 0.3–1.2)

## 2017-11-17 LAB — PH, BODY FLUID: pH, Body Fluid: 7.6

## 2017-11-17 NOTE — Progress Notes (Signed)
PROGRESS NOTE    [name]  WUJ:[id number] DOB: Oct 23, 1965 DOA: 11/14/2017 PCP: Dois Davenport, MD   Brief Narrative: [name] a 52 y.o.femalewith medical history significant ofstage III CKD, status post renal transplant on immunosuppression, hypertension, hypothyroidism, type 2 diabetes mellitus insulin-dependent diabetes, GERD, recently admitted for cellulitis of the lower extremities and discharged on 8 March comes in for shortness of breath since discharge associated with left-sided chest pressure on deep inspiration and productive cough. Chest x-ray showed left-sided pneumonia. CT scan showed multifocal pneumonia with moderate left-sided pleural effusion. Patient underwent left thoracentesis on 3/11 yielding 1 L of transudative effusion. She was referred to [facility] for evaluation and management of healthcare associated pneumonia.    Assessment & Plan:   Principal Problem:   HCAP (healthcare-associated pneumonia) Active Problems:   Immunosuppression (HCC)   Hypothyroidism   H/O kidney transplant   AKI (acute kidney injury) (HCC)   Diabetic foot infection (HCC)   Essential hypertension   GERD (gastroesophageal reflux disease)   Diarrhea   Insulin dependent diabetes mellitus (HCC)   CKD (chronic kidney disease) stage 3, GFR 30-59 ml/min (HCC)   Anemia of chronic disease   Type II diabetes mellitus (HCC)   Status post kidney transplant   Pneumonia   Acute on chronic congestive heart failure (HCC)   Acute respiratory failure with hypoxia secondary to multifocal pneumonia/ health care associated pneumonia and left sided mod effusion: Initially Admitted to step down req high flow oxygen at 12/min.  S/p thoracentesis of 1 lit of clear light fluid and fluid sent for analysis. Transudative in nature Oxygen requirements have improved.She looks comfortable today. Because she is immunocompromised, she was Started on broad spectrum antibiotics .  Currently she is on  vancomycin and cefepime.  Blood cultures from 3/10: no growth so far.  Gram stain, sputum culture:No result yet. Urine for strep pneumonia negative.  Added Hycodan for the cough  Diarrhea, suspect antibiotic induced diarrhea.  Cdiff pcr is negative.  Diarrhoea has improved today.  We will remove the rectal tube  Bilateral lower extremity cellulitis  Continue antibiotics as above, improving Venous Doppler on 11/05/17 showed an enlarged lymph node in the groin on the right and left, without any evidence of deep venous thrombosis of the lower extremities.  Probable acute on chronic diastolic heart failure:  BNP of 958, point-of-care troponin negative .2-D Echo done on 3/11 showed EF of 60-65% with no regional wall motion abnormalities, grade 2 diastolic dysfunction. . No significant change since 04/2016 S/p thoracentesis  History of stage III CKD, history of kidney transplant on chronic immunosuppression: Baseline creatinine around 2.  Continue patient's home dose of Prograf and prednisone. Creatinine on baseline  Anemia of chronic disease:  Stable .   Type 2 DM, insulin dependent: Currently on NovoLog 70/30, 10 units twice a day.  Follow Accu-Cheks closely Most recent hemoglobin A1c on 11/11/17 was 8.9  Hypertension:  Continue current meds      DVT prophylaxis: Hep [place] Code Status: Full Family Communication: None present at the bedside Disposition Plan: Home after resolution of pneumonia,PT evaluation   Consultants: None  Procedures:None   Antimicrobials: Vancomycin and cefepime  Subjective: Patient seen and examined the bedside this morning.  She looks better today.  She looks comfortable.  Not short of breath.  Needs less supplemental oxygen for saturation maintenance.  Lower extremity cellulitis healing very well.  Objective: Vitals:   11/16/17 1716 11/16/17 2103 11/17/17 0402 11/17/17 1223  BP: [name])  155/72 (!) 152/71 139/79 (!) 147/70  Pulse: 77 75 71 77    Resp: 20 18 18 18   Temp: 98.2 F (36.8 C) 99.6 F (37.6 C) 98.3 F (36.8 C) 98 F (36.7 C)  TempSrc: Oral Oral Oral Oral  SpO2: 99% 99% 94% 99%  Weight: 89.4 kg (197 lb 1.5 oz)     Height: 5\' 7"  (1.702 m)       Intake/Output Summary (Last 24 hours) at 11/17/2017 1449 Last data filed at 11/17/2017 1225 Gross per 24 hour  Intake 220 ml  Output 1000 ml  Net -780 ml   Filed Weights   11/14/17 1913 11/16/17 1716  Weight: 88.1 kg (194 lb 3.6 oz) 89.4 kg (197 lb 1.5 oz)    Examination:  General exam: Appears calm and comfortable ,Not in distress,average built HEENT:PERRL,Oral mucosa moist, Ear/Nose normal on gross exam Respiratory system: Bilateral basal crackles Cardiovascular system: S1 & S2 heard, RRR. No JVD, murmurs, rubs, gallops or clicks. No pedal edema. Gastrointestinal system: Abdomen is nondistended, soft and nontender. No organomegaly or masses felt. Normal bowel sounds heard. Central nervous system: Alert and oriented. No focal neurological deficits. Extremities: No edema, no clubbing ,no cyanosis, distal peripheral pulses palpable. Healing areas of  cellulitis on bilateral lower extremities,  amputated toes bilaterally Skin: No rashes, lesions or ulcers,no icterus ,no pallor MSK: Normal muscle bulk,tone ,power Psychiatry: Judgement and insight appear normal. Mood & affect appropriate.  Data Reviewed: I have personally reviewed following labs and imaging studies  CBC: Recent Labs  Lab 11/12/17 0507 11/14/17 1313 11/15/17 0904 11/16/17 0327 11/17/17 0749  WBC 9.7 10.0 12.2* 6.5 6.1  NEUTROABS 8.1* 9.0* 10.7*  --   --   HGB 9.2* 10.1* 9.8* 8.9* 8.8*  HCT 29.5* 32.6* 31.7* 28.5* 28.8*  MCV 91.9 91.3 89.8 90.2 91.1  PLT 325 321 275 188 162   Basic Metabolic Panel: Recent Labs  Lab 11/12/17 0507 11/14/17 1313 11/15/17 0904 11/16/17 0327 11/17/17 0749  NA 132* 136 139 134* 136  K 5.1 5.0 4.2 4.6 4.2  CL 101 103 107 104 105  CO2 22 23 25 24 24    GLUCOSE 201* 290* 84 202* 92  BUN 49* 43* 37* 29* 26*  CREATININE 2.42* 2.29* 2.02* 1.85* 1.66*  CALCIUM 8.2* 8.6* 8.6* 8.1* 8.3*   GFR: Estimated Creatinine Clearance: 46.5 mL/min (A) (by C-G formula based on SCr of 1.66 mg/dL (H)). Liver Function Tests: Recent Labs  Lab 11/14/17 1313 11/17/17 0749  AST 16 14*  ALT 11* 11*  ALKPHOS 113 87  BILITOT 0.6 0.4  PROT 6.1* 5.5*  ALBUMIN 1.8* 1.6*   No results for input(s): LIPASE, AMYLASE in the last 168 hours. No results for input(s): AMMONIA in the last 168 hours. Coagulation Profile: Recent Labs  Lab 11/14/17 1313  INR 0.95   Cardiac Enzymes: No results for input(s): CKTOTAL, CKMB, CKMBINDEX, TROPONINI in the last 168 hours. BNP (last 3 results) No results for input(s): PROBNP in the last 8760 hours. HbA1C: No results for input(s): HGBA1C in the last 72 hours. CBG: Recent Labs  Lab 11/16/17 1103 11/16/17 1557 11/16/17 2221 11/17/17 0741 11/17/17 1212  GLUCAP 267* 324* 87 91 79   Lipid Profile: No results for input(s): CHOL, HDL, LDLCALC, TRIG, CHOLHDL, LDLDIRECT in the last 72 hours. Thyroid Function Tests: No results for input(s): TSH, T4TOTAL, FREET4, T3FREE, THYROIDAB in the last 72 hours. Anemia Panel: No results for input(s): VITAMINB12, FOLATE, FERRITIN, TIBC, IRON, RETICCTPCT in the last  72 hours. Sepsis Labs: Recent Labs  Lab 11/14/17 1332  LATICACIDVEN 1.02    Recent Results (from the past 240 hour(s))  Culture, blood (Routine X 2) w Reflex to ID Panel     Status: None   Collection Time: 11/07/17  2:59 PM  Result Value Ref Range Status   Specimen Description   Final    LEFT ANTECUBITAL Performed at [facility], [address], [place], [place] [address]    Special Requests   Final    IN PEDIATRIC BOTTLE Blood Culture adequate volume Performed at [facility], [address], [place], [place] [address]    Culture   Final    NO GROWTH 5 DAYS Performed  at [facility], [address] [place], [place] [address]    Report Status 11/13/2017 FINAL  Final  Culture, blood (single)     Status: None   Collection Time: 11/07/17  3:12 PM  Result Value Ref Range Status   Specimen Description   Final    BLOOD LEFT HAND Performed at [facility], [address] [place], [place] [address]    Special Requests   Final    BOTTLES DRAWN AEROBIC ONLY Blood Culture adequate volume Performed at [facility], [address], [place], [place] [address]    Culture   Final    NO GROWTH 5 DAYS Performed at [facility], [address], [place], [place] [address]    Report Status 11/13/2017 FINAL  Final  Culture, blood (routine x 2)     Status: None (Preliminary result)   Collection Time: 11/14/17  1:13 PM  Result Value Ref Range Status   Specimen Description BLOOD RIGHT ANTECUBITAL  Final   Special Requests   Final    BOTTLES DRAWN AEROBIC AND ANAEROBIC Blood Culture adequate volume   Culture   Final    NO GROWTH 2 DAYS Performed at [facility], [address], [place], [place] [address]    Report Status PENDING  Incomplete  Urine culture     Status: None   Collection Time: 11/14/17  2:25 PM  Result Value Ref Range Status   Specimen Description   Final    URINE, CLEAN CATCH Performed at [facility], [address], [place], [place] [address]    Special Requests   Final    NONE Performed at [facility], [address], [place], [place] [address]    Culture   Final    NO GROWTH Performed at [facility], [address], [place], [place] [address]    Report Status 11/15/2017 FINAL  Final  Culture, blood (routine x 2)     Status: None (Preliminary result)   Collection Time: 11/14/17  5:20 PM  Result Value Ref Range Status   Specimen Description   Final    BLOOD LEFT ARM Performed at [facility], [address],  [place], [place] [address]    Special Requests   Final    Blood Culture results may not be optimal due to an excessive volume of blood received in culture bottles Performed at [facility], [address] [place], [place] [address]    Culture   Final    NO GROWTH 2 DAYS Performed at [facility], [address], [place], [place] [address]    Report Status PENDING  Incomplete  C difficile quick scan  w PCR reflex     Status: None   Collection Time: 11/15/17  8:26 AM  Result Value Ref Range Status   C Diff antigen NEGATIVE NEGATIVE Final   C Diff toxin NEGATIVE NEGATIVE Final   C Diff interpretation No C. difficile detected.  Final    Comment: Performed at [facility], [address], [place], [place] [address]  MRSA PCR Screening     Status: Abnormal   Collection Time: 11/16/17 11:40 AM  Result Value Ref Range Status   MRSA by PCR POSITIVE (A) NEGATIVE Final    Comment:        The GeneXpert MRSA Assay (FDA approved for NASAL specimens only), is one component of a comprehensive MRSA colonization surveillance program. It is not intended to diagnose MRSA infection nor to guide or monitor treatment for MRSA infections. RESULT CALLED TO, READ BACK BY AND VERIFIED WITH: LONG,M @ 1717 ON [id number] BY POTEAT,S Performed at [facility], [address], [place], [place] [address]          Radiology Studies: Dg Swallowing Func-speech Pathology  Result Date: 11/16/2017 Objective Swallowing Evaluation: Type of Study: MBS-Modified Barium Swallow Study  Patient Details Name: [name] MRN: [id number] Date of Birth: 11/23/1965 Today's Date: 11/16/2017 Time: SLP Start Time (ACUTE ONLY): 1500 -SLP Stop Time (ACUTE ONLY): 1520 SLP Time Calculation (min) (ACUTE ONLY): 20 min Past Medical History: Past Medical History: Diagnosis Date . Arthritis   "elbows, knees, legs, back" (09/24/2015) . CKD (chronic kidney disease)  . Daily headache   . Depression   "years ago" . End stage renal disease (HCC)   right arm AV graft, resolved post transplant . Hypertension  . Hypothyroid  . Immunosuppression (HCC)   secondary to renal transplant . Kidney disease 2011 . Pneumonia ~ 2007? [name] Type II diabetes mellitus (HCC)   Insulin dependant Past Surgical History: Past Surgical History: Procedure Laterality Date . AMPUTATION Left 05/11/2014  Procedure: AMPUTATION LEFT GREAT TOE;  Surgeon: Kathryne Hitch, MD;  Location: [facility];  Service: Orthopedics;  Laterality: Left; . AV FISTULA PLACEMENT Right   forearm . BACK SURGERY   . CATARACT EXTRACTION W/ INTRAOCULAR LENS  IMPLANT, BILATERAL Bilateral  . CESAREAN SECTION  07/1999 . DG AV DIALYSIS GRAFT DECLOT OR   . HIP PINNING,CANNULATED Right 06/02/2017  Procedure: CANNULATED RIGHT HIP PINNING;  Surgeon: Beverely Low, MD;  Location: [facility];  Service: Orthopedics;  Laterality: Right; . INCISION AND DRAINAGE Right 06/02/2015  Procedure: INCISION AND DRAINAGE OF RIGHT 3rd RAY RESECTION;  Surgeon: Kathryne Hitch, MD;  Location: [facility] OR;  Service: Orthopedics;  Laterality: Right; . KIDNEY TRANSPLANT Right December 16, 2009 . LUMBAR DISC SURGERY  2001 . NEPHRECTOMY TRANSPLANTED ORGAN   . TOE AMPUTATION Right   1,2 & 3rd toes. . TUBAL LIGATION  07/1999 HPI: 52 yo female [facility] with HCAP.  PMH + for renal transplant, ESRD, recurrent pnas, prior smoker.  CXR indicated LUL pna - pt is s/p thoracentesis today.  Swallow evaluation ordered.  Subjective: Pt seen in radiology for MBS Assessment / Plan / Recommendation CHL IP CLINICAL IMPRESSIONS 11/16/2017 Clinical Impression Pt presents with functional oral stage swallow, however, she currently has several sores in her oral cavity which are causing discomfort with chewing solids. Her dentition is poor. Pharyngeally, pt exhibits delayed swallow reflex across consistencies, with trigger noted at the vallecular sinus. Trace penetration of thin liquids was noted during the  swallow. No aspiration  was seen, however, pt is at increased risk over time. Chin tuck position with thin liquids, facilitated by straight (unbent) straw, eliminated penetration of thin liquids, even with large and consecutive boluses. Given oral pain and pt request, will change diet to dys 2 with additional sauce/gravy. Thin liquids ok via straw with chin tuck. Safe swallow precautions were sent with pt back to her room. ST wil follow for assessment of diet tolerance and education. Anticipate being able to advance solids at bedside as oral pain is decreased.  SLP Visit Diagnosis Dysphagia, oropharyngeal phase (R13.12) Impact on safety and function Mild aspiration risk   CHL IP TREATMENT RECOMMENDATION 11/16/2017 Treatment Recommendations Therapy as outlined in treatment plan below   Prognosis 11/16/2017 Prognosis for Safe Diet Advancement Good CHL IP DIET RECOMMENDATION 11/16/2017 SLP Diet Recommendations Dysphagia 2 (Fine chop) solids;Thin liquid Liquid Administration via Straw Medication Administration Whole meds with liquid Compensations Minimize environmental distractions;Slow rate;Small sips/bites;Use straw to facilitate chin tuck;Chin tuck Postural Changes Seated upright at 90 degrees   CHL IP OTHER RECOMMENDATIONS 11/16/2017 Oral Care Recommendations Oral care QID   CHL IP FOLLOW UP RECOMMENDATIONS 11/16/2017 Follow up Recommendations None   CHL IP FREQUENCY AND DURATION 11/16/2017 Speech Therapy Frequency (ACUTE ONLY) min 1 x/week Treatment Duration 2 weeks      CHL IP ORAL PHASE 11/16/2017 Oral Phase WFL  CHL IP PHARYNGEAL PHASE 11/16/2017 Pharyngeal Phase Impaired Pharyngeal- Thin Cup Penetration/Aspiration during swallow;Delayed swallow initiation-vallecula Pharyngeal Material enters airway, remains ABOVE vocal cords and not ejected out Pharyngeal- Thin Straw Delayed swallow initiation-vallecula Pharyngeal- Puree Delayed swallow initiation-vallecula Pharyngeal- Mechanical Soft Delayed swallow  initiation-vallecula  CHL IP CERVICAL ESOPHAGEAL PHASE 11/16/2017 Cervical Esophageal Phase [facility] Celia B. Murvin [facility], CCC-SLP Speech Language Pathologist [phone] Leigh Aurora 11/16/2017, 3:47 PM                   Scheduled Meds: . carvedilol  25 mg Oral BID WC  . diphenoxylate-atropine  1 tablet Oral TID  . enoxaparin (LOVENOX) injection  40 mg Subcutaneous Q24H  . HYDROcodone-homatropine  5 mL Oral Q6H  . insulin aspart  0-15 Units Subcutaneous TID WC  . insulin aspart protamine- aspart  10 Units Subcutaneous BID WC  . levothyroxine  137 mcg Oral QAC breakfast  . mycophenolate  500 mg Oral BID  . nystatin   Topical TID  . predniSONE  5 mg Oral Daily  . sodium bicarbonate  1,300 mg Oral BID  . tacrolimus  0.5 mg Oral QAC breakfast  . tacrolimus  1 mg Oral Q2000  . vitamin B-12  100 mcg Oral Daily   Continuous Infusions: . ceFEPime (MAXIPIME) IV Stopped (11/17/17 0859)  . vancomycin Stopped (11/17/17 1102)     LOS: 3 days    Time spent: More than 50% of that time was spent in counseling and/or coordination of care.      Meredith Leeds, MD [facility] Pager [phone]  If 7PM-7AM, please contact night-coverage [email or web address] Password [facility] 11/17/2017, 2:49 PM

## 2017-11-17 NOTE — Care Management Important Message (Signed)
Important Message  Patient Details IM Letter given to [name] to present to the Patient Name: [name] MRN: [id number] Date of Birth: 06-06-1966   Medicare Important Message Given:  Yes    [name], [name] 11/17/2017, 11:57 AM

## 2017-11-17 NOTE — Evaluation (Addendum)
Physical Therapy Evaluation Patient Details Name: [name] MRN: [id number] DOB: [birth year] Today's Date: 11/17/2017   History of Present Illness  52 yo female admitted with Pna. Recent admission for bil LE cellulitis. Discharged on 11/12/17. Hx of DM, CKD, renal transplant, HTN, hypothyroidsim.     Clinical Impression  On eval, pt required Mod assist for mobility. She was able to perform a stand pivot x 2 with a RW. She c/o pain in bilateral calves, L >R, with activity. Pt demonstrates general weakness, decreased activity tolerance, and impaired gait and balance. She is now agreeable to ST [facility]hab at [facility]. Will follow and progress activity as tolerated.     Follow Up Recommendations SNF (HHPT and 24 hour supervision/assit if pt decides to return home)    Equipment Recommendations  None recommended by PT    Recommendations for Other Services       Precautions / Restrictions Precautions Precautions: Fall Precaution Comments: monitor O2 Restrictions Weight Bearing Restrictions: No      Mobility  Bed Mobility               General bed mobility comments: pt in chair  Transfers Overall transfer level: Needs assistance Equipment used: Rolling walker (2 wheeled) Transfers: Sit to/from Stand Sit to Stand: Mod assist Stand pivot transfers: Mod assist       General transfer comment: VCs safety, technique, hand placement. Assist to rise, stabilize, control descent, maneuver with RW. Stand pivot x2, bed<>recliner, with RW.   Ambulation/Gait             General Gait Details: NT-pt c/o significant calf pain on today.   Stairs            Wheelchair Mobility    Modified Rankin (Stroke Patients Only)       Balance                                             Pertinent Vitals/Pain Pain Assessment: Faces Pain Score: 4  Faces Pain Scale: Hurts even more Pain Location: calves Pain Descriptors / Indicators:  Sharp;Sore;Guarding;Grimacing;Tightness Pain Intervention(s): Limited activity within patient's tolerance;Repositioned    Home Living   Living Arrangements: Spouse/significant other Available Help at Discharge: Family;Available PRN/intermittently Type of Home: Mobile home Home Access: Stairs to enter Entrance Stairs-Rails: Right;Left;Can reach both Entrance Stairs-Number of Steps: 8 in the front, 2 in the back Home Layout: One level Home Equipment: Shower seat;Cane - single point;Walker - 4 wheels;Bedside commode      Prior Function Level of Independence: Needs assistance         Comments: pt has been in bed since last admission     Hand Dominance        Extremity/Trunk Assessment   Upper Extremity Assessment Upper Extremity Assessment: Defer to OT evaluation    Lower Extremity Assessment Lower Extremity Assessment: Generalized weakness    Cervical / Trunk Assessment Cervical / Trunk Assessment: Normal  Communication   Communication: No difficulties  Cognition Arousal/Alertness: Awake/alert Behavior During Therapy: WFL for tasks assessed/performed Overall Cognitive Status: Within Functional Limits for tasks assessed                                        General Comments      Exercises  Assessment/Plan    PT Assessment Patient needs continued PT services  PT Problem List Decreased balance;Decreased strength;Decreased mobility;Pain;Decreased knowledge of use of DME       PT Treatment Interventions DME instruction;Gait training;Functional mobility training;Balance training;Patient/family education;Therapeutic exercise;Therapeutic activities    PT Goals (Current goals can be found in the Care Plan section)  Acute Rehab PT Goals Patient Stated Goal: go to rehab PT Goal Formulation: With patient Time For Goal Achievement: 12/01/17 Potential to Achieve Goals: Good    Frequency Min 3X/week   Barriers to discharge         Co-evaluation               AM-PAC PT "6 Clicks" Daily Activity  Outcome Measure Difficulty turning over in bed (including adjusting bedclothes, sheets and blankets)?: A Lot Difficulty moving from lying on back to sitting on the side of the bed? : Unable Difficulty sitting down on and standing up from a chair with arms (e.g., wheelchair, bedside commode, etc,.)?: Unable Help needed moving to and from a bed to chair (including a wheelchair)?: A Lot Help needed walking in hospital room?: A Lot Help needed climbing 3-5 steps with a railing? : Total 6 Click Score: 9    End of Session   Activity Tolerance: Patient limited by pain Patient left: in chair;with call bell/phone within reach   PT Visit Diagnosis: Muscle weakness (generalized) (M62.81);Difficulty in walking, not elsewhere classified (R26.2)    Time: [id number] PT Time Calculation (min) (ACUTE ONLY): 13 min   Charges:   PT Evaluation $PT Eval Moderate Complexity: 1 Mod     PT G Codes:          Rebeca AlertJannie America Sandall, MPT Pager: [phone]

## 2017-11-17 NOTE — Evaluation (Signed)
Occupational Therapy Evaluation Patient Details Name: [name] MRN: [id number] DOB: 04-29-1966 Today's Date: 11/17/2017    History of Present Illness [name] is a 52 y.o. female with medical history significant of stage III CKD, status post renal transplant on immunosuppression, hypertension, hypothyroidism, type 2 diabetes mellitus insulin-dependent diabetes, GERD, recently admitted for cellulitis of the lower extremities and discharged on 8 March comes in for shortness of breath since discharge associated with left-sided chest pressure on deep inspiration and productive cough. Chest x-ray showed left-sided pneumonia. CT scan showed multifocal pneumonia with moderate left-sided pleural effusion. Patient underwent left thoracentesis on 3/11 yielding 1 L of transudative effusion   Clinical Impression   Pt admitted with pneumonia. Pt currently with functional limitations due to the deficits listed below (see OT Problem List).  Pt will benefit from skilled OT to increase their safety and independence with ADL and functional mobility for ADL to facilitate discharge to venue listed below.      Follow Up Recommendations  SNF;Supervision/Assistance - 24 hour    Equipment Recommendations  3 in 1 bedside commode;Other (comment)(I verbally made CM aware since plan as of today was changed from SNF to [facility])    Recommendations for Other Services       Precautions / Restrictions        Mobility Bed Mobility               General bed mobility comments: pt in chair  Transfers Overall transfer level: Needs assistance Equipment used: Rolling walker (2 wheeled) Transfers: Sit to/from [employer] Transfers Sit to Stand: Mod assist Stand pivot transfers: Mod assist       General transfer comment: Vc to power up with BUE and hand placement    Balance                                           ADL either performed or assessed with clinical judgement    ADL Overall ADL's : Needs assistance/impaired Eating/Feeding: Set up;Sitting   Grooming: Set up;Sitting   Upper Body Bathing: Sitting;Minimal assistance   Lower Body Bathing: Sit to/from stand;Maximal assistance   Upper Body Dressing : Minimal assistance;Sitting   Lower Body Dressing: Maximal assistance;Sit to/from stand   Toilet Transfer: Moderate assistance;Stand-pivot;BSC;RW   Toileting- Clothing Manipulation and Hygiene: Maximal assistance               Vision         Perception     Praxis      Pertinent Vitals/Pain Pain Score: 4  Pain Location: calves Pain Intervention(s): Repositioned     Hand Dominance     Extremity/Trunk Assessment             Communication Communication Communication: No difficulties   Cognition Arousal/Alertness: Awake/alert Behavior During Therapy: WFL for tasks assessed/performed Overall Cognitive Status: Within Functional Limits for tasks assessed                                     General Comments       Exercises     Shoulder Instructions      Home Living   Living Arrangements: Spouse/significant other Available Help at Discharge: Family;Available PRN/intermittently Type of Home: Mobile home Home Access: Stairs to enter Entrance Stairs-Number of Steps: 8 in  the front, 2 in the back Entrance Stairs-Rails: Right;Left;Can reach both Home Layout: One level     Bathroom Shower/Tub: Chief Strategy OfficerTub/shower unit   Bathroom Toilet: Standard     Home Equipment: Shower seat;Cane - single point;Walker - 4 wheels;Bedside commode          Prior Functioning/Environment Level of Independence: Needs assistance        Comments: pt has been in bed since last admission        OT Problem List: Decreased strength;Decreased activity tolerance;Impaired balance (sitting and/or standing)      OT Treatment/Interventions: Self-care/ADL training;DME and/or AE instruction;Therapeutic activities;Balance  training;Therapeutic exercise;Energy conservation;Patient/family education    OT Goals(Current goals can be found in the care plan section) Acute Rehab OT Goals Patient Stated Goal: go to rehab OT Goal Formulation: With patient Time For Goal Achievement: 12/01/17 ADL Goals Pt Will Perform Lower Body Dressing: with supervision;sit to/from stand Pt Will Transfer to Toilet: with supervision;regular height toilet;bedside commode Pt Will Perform Toileting - Clothing Manipulation and hygiene: with supervision;sit to/from stand  OT Frequency: Min 2X/week   Barriers to [name]/C:            Co-evaluation              AM-PAC PT "6 Clicks" Daily Activity     Outcome Measure Help from another person eating meals?: None Help from another person taking care of personal grooming?: A Little Help from another person toileting, which includes using toliet, bedpan, or urinal?: A Lot Help from another person bathing (including washing, rinsing, drying)?: A Lot Help from another person to put on and taking off regular upper body clothing?: A Little Help from another person to put on and taking off regular lower body clothing?: A Lot 6 Click Score: 16   End of Session Equipment Utilized During Treatment: Rolling walker Nurse Communication: Mobility status  Activity Tolerance: Patient tolerated treatment well Patient left: with call bell/phone within reach;with nursing/sitter in room;in chair  OT Visit Diagnosis: Muscle weakness (generalized) (M62.81);Other abnormalities of gait and mobility (R26.89);Pain Pain - part of body: (bil calfs and left hip)                Time: 1417-1430 OT Time Calculation (min): 13 min Charges:  OT General Charges $OT Visit: 1 Visit OT Evaluation $OT Eval Moderate Complexity: 1 Mod G-Codes:     [name], [place] [phone]  [name], [name] 11/17/2017, 3:23 PM

## 2017-11-17 NOTE — Progress Notes (Signed)
  Speech Language Pathology Treatment: Dysphagia  Patient Details Name: [name] MRN: [id number] DOB: Jul 19, 1966 Today's Date: 11/17/2017 Time: [id number] SLP Time Calculation (min) (ACUTE ONLY): 18 min  Assessment / Plan / Recommendation Clinical Impression  Pt sitting fully upright in chair - recalls having MBS done but did not recall chin tuck without cues.  Pt admits to h/o dysphagia and coughing with intake even prior to admit.  SLP observed pt with intake of thin liquids using chin tuck - no indications of airway compromise.  Oral lesions noted - for which pt is managing by eating soft foods and advised she avoid acidic items.  Note pt with poor dentition - advised her to follow up with dentist after discharge to seek dental management due to increased asp pna risk with poor dentition.  Of note, voice and cough are strong today and pt reports them to be near baseline.    HPI HPI: 52 [facility].  PMH + for renal transplant, ESRD, recurrent pnas, prior smoker.  CXR indicated LUL pna - pt is s/p thoracentesis today.  Swallow evaluation ordered.       SLP Plan  Continue with current plan of care       Recommendations  Diet recommendations: Dysphagia 2 (fine chop);Thin liquid(pt with oral sores) Medication Administration: Whole meds with liquid Supervision: Patient able to self feed Compensations: Minimize environmental distractions;Slow rate;Small sips/bites Postural Changes and/or Swallow Maneuvers: Seated upright 90 degrees;Upright 30-60 min after meal                Oral Care Recommendations: Oral care BID Follow up Recommendations: (TBD) SLP Visit Diagnosis: Dysphagia, unspecified (R13.10) Plan: Continue with current plan of care       GO                [name], [name] 11/17/2017, 4:42 PM  [name] Burnetamara Brookelynn Hamor, MS [facility] SLP [phone]

## 2017-11-18 LAB — BASIC METABOLIC PANEL
ANION GAP: 6 (ref 5–15)
BUN: 22 mg/dL — ABNORMAL HIGH (ref 6–20)
CALCIUM: 8.6 mg/dL — AB (ref 8.9–10.3)
CHLORIDE: 106 mmol/L (ref 101–111)
CO2: 26 mmol/L (ref 22–32)
Creatinine, Ser: 1.68 mg/dL — ABNORMAL HIGH (ref 0.44–1.00)
GFR calc non Af Amer: 34 mL/min — ABNORMAL LOW (ref >60)
GFR, EST AFRICAN AMERICAN: 40 mL/min — AB (ref >60)
Glucose, Bld: 130 mg/dL — ABNORMAL HIGH (ref 65–99)
Potassium: 4.7 mmol/L (ref 3.5–5.1)
Sodium: 138 mmol/L (ref 135–145)

## 2017-11-18 LAB — GLUCOSE, CAPILLARY
GLUCOSE-CAPILLARY: 163 mg/dL — AB (ref 65–99)
GLUCOSE-CAPILLARY: 146 mg/dL — AB (ref 65–99)
GLUCOSE-CAPILLARY: 170 mg/dL — AB (ref 65–99)
Glucose-Capillary: 310 mg/dL — ABNORMAL HIGH (ref 65–99)

## 2017-11-18 MED ORDER — MUPIROCIN 2 % EX OINT
1.0000 "application " | TOPICAL_OINTMENT | Freq: Two times a day (BID) | CUTANEOUS | Status: DC
  Administered 2017-11-18 – 2017-11-19 (×2): 1 via NASAL
  Filled 2017-11-18: qty 22

## 2017-11-18 MED ORDER — CHLORHEXIDINE GLUCONATE CLOTH 2 % EX PADS
6.0000 | MEDICATED_PAD | Freq: Every day | CUTANEOUS | Status: DC
  Administered 2017-11-19: 6 via TOPICAL

## 2017-11-18 MED ORDER — CHLORHEXIDINE GLUCONATE CLOTH 2 % EX PADS
6.0000 | MEDICATED_PAD | Freq: Every day | CUTANEOUS | Status: DC
  Administered 2017-11-18: 6 via TOPICAL

## 2017-11-18 MED ORDER — MUPIROCIN 2 % EX OINT
1.0000 "application " | TOPICAL_OINTMENT | Freq: Two times a day (BID) | CUTANEOUS | Status: DC
  Administered 2017-11-18: 1 via NASAL
  Filled 2017-11-18: qty 22

## 2017-11-18 NOTE — Clinical Social Work Placement (Signed)
   CLINICAL SOCIAL WORK PLACEMENT  NOTE  Date:  11/18/2017  Patient Details  Name: [name] MRN: [id number] Date of Birth: [birth year]  Clinical Social Work is seeking post-discharge placement for this patient at the Skilled  Nursing Facility level of care (*CSW will initial, date and re-position this form in  chart as items are completed):  Yes   Patient/family provided with [facility] Clinical Social Work Department's list of facilities offering this level of care within the geographic area requested by the patient (or if unable, by the patient's family).  Yes   Patient/family informed of their freedom to choose among providers that offer the needed level of care, that participate in Medicare, Medicaid or managed care program needed by the patient, have an available bed and are willing to accept the patient.  Yes   Patient/family informed of [facility]'s ownership interest [facility] and [facility], as well as of the fact that they are under no obligation to receive care at these facilities.  PASRR submitted to EDS on       PASRR number received on       Existing PASRR number confirmed on 11/18/17     FL2 transmitted to all facilities in geographic area requested by pt/family on 11/18/17     FL2 transmitted to all facilities within larger geographic area on       Patient informed that his/her managed care company has contracts with or will negotiate with certain facilities, including the following:            Patient/family informed of bed offers received.  Patient chooses bed at       Physician recommends and patient chooses bed at      Patient to be transferred to   on  .  Patient to be transferred to facility by       Patient family notified on   of transfer.  Name of family member notified:        PHYSICIAN Please sign FL2, Please prepare priority discharge summary, including medications     Additional Comment:     _______________________________________________ [name], [name] Riggio N, LCSW 11/18/2017, 1:42 PM

## 2017-11-18 NOTE — NC FL2 (Signed)
[place] MEDICAID FL2 LEVEL OF CARE SCREENING TOOL     IDENTIFICATION  Patient Name: [name] Birthdate: November 04, 1965 Sex: female Admission Date (Current Location): 11/14/2017  [facility] [place]   Facility and Address:  [facility],  [address], [place] [id number]      Provider Number: [id number]  Attending Physician Name and Address:  Meredith LeedsAdhikari Bk, Amrit, MD  Relative Name and Phone Number:       Current Level of Care: Hospital Recommended Level of Care: Skilled Nursing Facility Prior Approval Number:    Date Approved/Denied:   PASRR Number: [id number]  Discharge Plan: SNF    Current Diagnoses: Patient Active Problem List   Diagnosis Date Noted  . Acute on chronic congestive heart failure (HCC)   . HCAP (healthcare-associated pneumonia) 11/14/2017  . Localized swelling of lower extremity 11/05/2017  . Malnutrition of moderate degree 09/02/2017  . Pneumonia   . Status post kidney transplant 06/02/2017  . Closed displaced fracture of right femoral neck (HCC) 06/02/2017  . Femoral neck fracture (HCC) 06/02/2017  . Hyperglycemia 05/09/2017  . Lower extremity cellulitis 03/23/2017  . Type II diabetes mellitus (HCC) 03/23/2017  . Anemia of chronic disease 01/26/2017  . Abnormal LFTs 01/26/2017  . DKA (diabetic ketoacidoses) (HCC) 08/12/2016  . Hypoglycemia   . Wound cellulitis   . CKD (chronic kidney disease) stage 3, GFR 30-59 ml/min (HCC) 06/26/2016  . Cellulitis of right lower extremity   . QT prolongation 05/13/2016  . UTI (lower urinary tract infection)   . Hypomagnesemia   . Pressure ulcer 04/11/2016  . Acute encephalopathy 04/11/2016  . Altered mental status 04/10/2016  . Insulin dependent diabetes mellitus (HCC) 04/10/2016  . Diabetic ketoacidosis without coma associated with type 2 diabetes mellitus (HCC) 11/02/2015  . Uncontrolled type 1 diabetes mellitus with foot ulcer (HCC) 06/04/2015  . Foot ulcer, right  (HCC)   . Diarrhea 06/02/2015  . Type 1 diabetes mellitus with diabetic foot ulcer (HCC) 06/02/2015  . Acute renal failure superimposed on stage 3 chronic kidney disease (HCC) 06/02/2015  . Diabetic foot infection (HCC) 06/01/2015  . Essential hypertension 06/01/2015  . GERD (gastroesophageal reflux disease) 06/01/2015  . AKI (acute kidney injury) (HCC) 01/02/2015  . H/O kidney transplant 08/04/2012  . Hyponatremia 09/22/2011  . Immunosuppression (HCC)   . Hypothyroidism     Orientation RESPIRATION BLADDER Height & Weight     Self, Time, Situation, Place  O2(2L) Continent Weight: 197 lb 1.5 oz (89.4 kg) Height:  5\' 7"  (170.2 cm)  BEHAVIORAL SYMPTOMS/MOOD NEUROLOGICAL BOWEL NUTRITION STATUS      Incontinent Diet  Diet recommendations: Dysphagia 2 (fine chop);Thin liquid(pt with oral sores) Medication Administration: Whole meds with liquid Supervision: Patient able to self feed Compensations: Minimize environmental distractions;Slow rate;Small sips/bites Postural Changes and/or Swallow Maneuvers: Seated upright 90 degrees;Upright 30-60 min after meal  AMBULATORY STATUS COMMUNICATION OF NEEDS Skin   Limited Assist Verbally Normal                       Personal Care Assistance Level of Assistance  Bathing, Feeding, Dressing Bathing Assistance: Maximum assistance Feeding assistance: Limited assistance Dressing Assistance: Maximum assistance     Functional Limitations Info  Sight, Hearing, Speech Sight Info: Adequate Hearing Info: Adequate Speech Info: Adequate    SPECIAL CARE FACTORS FREQUENCY  PT (By licensed PT), OT (By licensed OT), Speech therapy     PT Frequency: 5x OT Frequency: 5x  Speech Therapy Frequency: 2x      Contractures Contractures Info: Not present    Additional Factors Info  Code Status, Allergies Code Status Info: Full Code Allergies Info: Latex, Ace inhibitors, Lisinopril     Isolation Precautions Info: Contact: MRSA     Current  Medications (11/18/2017):  This is the current hospital active medication list Current Facility-Administered Medications  Medication Dose Route Frequency Provider Last Rate Last Dose  . acetaminophen (TYLENOL) tablet 325 mg  325 mg Oral Q6H PRN Kathlen Mody, MD      . carvedilol (COREG) tablet 25 mg  25 mg Oral BID WC Kathlen Mody, MD   25 mg at 11/18/17 0857  . ceFEPIme (MAXIPIME) 1 g in sodium chloride 0.9 % 100 mL IVPB  1 g Intravenous Q12H Kathlen Mody, MD   Stopped at 11/18/17 0930  . diphenoxylate-atropine (LOMOTIL) 2.5-0.025 MG per tablet 1 tablet  1 tablet Oral TID Richarda Overlie, MD   1 tablet at 11/18/17 0902  . enoxaparin (LOVENOX) injection 40 mg  40 mg Subcutaneous Q24H Kathlen Mody, MD   40 mg at 11/17/17 2115  . hydrALAZINE (APRESOLINE) injection 5 mg  5 mg Intravenous Q6H PRN Kathlen Mody, MD      . HYDROcodone-acetaminophen (NORCO/VICODIN) 5-325 MG per tablet 1 tablet  1 tablet Oral Q6H PRN Kathlen Mody, MD   1 tablet at 11/18/17 0855  . HYDROcodone-homatropine (HYCODAN) 5-1.5 MG/5ML syrup 5 mL  5 mL Oral Q6H Abrol, Nayana, MD   5 mL at 11/18/17 1100  . insulin aspart (novoLOG) injection 0-15 Units  0-15 Units Subcutaneous TID WC Richarda Overlie, MD   2 Units at 11/18/17 1257  . insulin aspart protamine- aspart (NOVOLOG MIX 70/30) injection 10 Units  10 Units Subcutaneous BID WC Kathlen Mody, MD   10 Units at 11/18/17 0858  . levothyroxine (SYNTHROID, LEVOTHROID) tablet 137 mcg  137 mcg Oral QAC breakfast Kathlen Mody, MD   137 mcg at 11/18/17 1126  . magic mouthwash w/lidocaine  5 mL Oral TID PRN Richarda Overlie, MD   5 mL at 11/17/17 1815  . mycophenolate (CELLCEPT) capsule 500 mg  500 mg Oral BID Kathlen Mody, MD   500 mg at 11/18/17 0859  . nystatin (MYCOSTATIN/NYSTOP) topical powder   Topical TID Kathlen Mody, MD      . predniSONE (DELTASONE) tablet 5 mg  5 mg Oral Daily Kathlen Mody, MD   5 mg at 11/18/17 0902  . sodium bicarbonate tablet 1,300 mg  1,300 mg Oral BID  Kathlen Mody, MD   1,300 mg at 11/18/17 0902  . tacrolimus (PROGRAF) capsule 0.5 mg  0.5 mg Oral QAC breakfast Kathlen Mody, MD   0.5 mg at 11/18/17 0857  . tacrolimus (PROGRAF) capsule 1 mg  1 mg Oral Q2000 Kathlen Mody, MD   1 mg at 11/17/17 2047  . vancomycin (VANCOCIN) IVPB 750 mg/150 ml premix  750 mg Intravenous Q24H Rollene Fare, [facility]   Stopped at 11/18/17 1236  . vitamin B-12 (CYANOCOBALAMIN) tablet 100 mcg  100 mcg Oral Daily Kathlen Mody, MD   100 mcg at 11/18/17 0902     Discharge Medications: Please see discharge summary for a list of discharge medications.  Relevant Imaging Results:  Relevant Lab Results:   Additional Information SSN: [id number] [place], [place]

## 2017-11-18 NOTE — Progress Notes (Signed)
PROGRESS NOTE    [name]  ZOX:[id number] DOB: Sep 09, 1965 DOA: 11/14/2017 PCP: Dois Davenportichter, Karen L, MD   Brief Narrative: [name] a 52 y.o.femalewith medical history significant ofstage III CKD, status post renal transplant on immunosuppression, hypertension, hypothyroidism, type 2 diabetes mellitus insulin-dependent diabetes, GERD, recently admitted for cellulitis of the lower extremities and discharged on 8 March comes in for shortness of breath.She was  having  left-sided chest pressure on deep inspiration and productive cough. Chest x-ray showed left-sided pneumonia. CT scan showed multifocal pneumonia with moderate left-sided pleural effusion.  Patient admitted for the management of healthcare associated pneumonia and left-sided pleural effusion .Patient underwent left thoracentesis on 3/11 yielding 1 [name] of transudative fluid.  Respiratory status has continued to improve.  Physical therapy to the patient and recommend discharge to SNF.  Social worker on board.    Assessment & Plan:   Principal Problem:   HCAP (healthcare-associated pneumonia) Active Problems:   Immunosuppression (HCC)   Hypothyroidism   H/O kidney transplant   AKI (acute kidney injury) (HCC)   Diabetic foot infection (HCC)   Essential hypertension   GERD (gastroesophageal reflux disease)   Diarrhea   Insulin dependent diabetes mellitus (HCC)   CKD (chronic kidney disease) stage 3, GFR 30-59 ml/min (HCC)   Anemia of chronic disease   Type II diabetes mellitus (HCC)   Status post kidney transplant   Pneumonia   Acute on chronic congestive heart failure (HCC)   Acute respiratory failure with hypoxia secondary to multifocal pneumonia/ health care associated pneumonia and left sided mod effusion: Initially Admitted to step down req high flow oxygen at 12/min.  S/p thoracentesis of 1 lit of clear light fluid and fluid sent for analysis. Transudative in nature Oxygen requirements have  improved.She looks comfortable today. Because she is immunocompromised, she was Started on broad spectrum antibiotics .  Currently she is on vancomycin and cefepime.  Blood cultures from 3/10: no growth so far.  Gram stain, sputum culture:No result yet. Urine for strep pneumonia negative.  Respiratory status is stable. We will continue IV antibiotics until she is discharged.  Antibiotics might be changed to Levaquin on DC.  Diarrhea, suspect antibiotic induced diarrhea.  Cdiff pcr is negative.  Diarrhoea has improved .  We have removed the rectal tube  Bilateral lower extremity cellulitis  Continue antibiotics as above, improving Venous Doppler on 11/05/17 showed an enlarged lymph node in the groin on the right and left, without any evidence of deep venous thrombosis of the lower extremities. She was discharged on 11/12/17  for the treatment of bilateral lower extremity cellulitis.  Cellulitis on bilateral lower extremities on the dorsal surface have improved significantly.  She has an area of tender, erythematous cellulitic area on her left calf which is also improving. She was discharged on doxycycline on her last admission and she was supposed to follow-up with wound care as an outpatient.  Chronic diastolic heart failure:  2-D Echo done on 3/11 showed EF of 60-65% with no regional wall motion abnormalities, grade 2 diastolic dysfunction. . No significant change since 04/2016 S/p thoracentesis  History of stage III CKD, history of kidney transplant on chronic immunosuppression: Baseline creatinine around 2.  Continue patient's home dose of Prograf and prednisone. Creatinine on baseline  Anemia of chronic disease:  Stable .   Type 2 DM, insulin dependent: Currently on NovoLog 70/30, 10 units twice a day.  Follow Accu-Cheks closely Most recent hemoglobin A1c on 11/11/17 was 8.9  Hypertension:  Continue current meds   Patient has been evaluated by physical therapy and recommended   skilled nursing facility.  Social worker on board  DVT prophylaxis: Hep [place] Code Status: Full Family Communication: None present at the bedside Disposition Plan: Waiting for skilled nursing facility bed   Consultants: None  Procedures:None   Antimicrobials: Vancomycin and cefepime since 11/15/17  Subjective: Patient seen and examined the bedside this morning.  She looks better today.  She looks comfortable.  Not short of breath.  Needs less supplemental oxygen for saturation maintenance.  Lower extremity cellulitis healing  well but cellulitis on her left calf is still tender.  Objective: Vitals:   11/17/17 1223 11/17/17 2022 11/18/17 0426 11/18/17 1410  BP: (!) 147/70 (!) 157/72 (!) 156/71 (!) 165/75  Pulse: 77 78 79 76  Resp: 18 18 19 18   Temp: 98 F (36.7 C) 98.1 F (36.7 C) 98.5 F (36.9 C) 99.3 F (37.4 C)  TempSrc: Oral Oral Oral Oral  SpO2: 99% 98% 98% 100%  Weight:      Height:        Intake/Output Summary (Last 24 hours) at 11/18/2017 1446 Last data filed at 11/18/2017 0535 Gross per 24 hour  Intake 850 ml  Output 550 ml  Net 300 ml   Filed Weights   11/14/17 1913 11/16/17 1716  Weight: 88.1 kg (194 lb 3.6 oz) 89.4 kg (197 lb 1.5 oz)    Examination:  General exam: Appears calm and comfortable ,Not in distress,average built, chronically ill HEENT:PERRL,Oral mucosa moist, Ear/Nose normal on gross exam Respiratory system: Bilateral decreased air entry in the bases  cardiovascular system: S1 & S2 heard, RRR. No JVD, murmurs, rubs, gallops or clicks. Gastrointestinal system: Abdomen is nondistended, soft and nontender. No organomegaly or masses felt. Normal bowel sounds heard. Central nervous system: Alert and oriented. No focal neurological deficits. Extremities: No edema, no clubbing ,no cyanosis, distal peripheral pulses palpable. Healing cellulitis on bilateral lower extremities on the dorsal surface, areas of abrasions Left calf erythematous and  tender Skin: Rashes on the lower extremity, tattoos MSK: Normal muscle bulk,tone ,power Psychiatry: Judgement and insight appear normal. Mood & affect appropriate.    Data Reviewed: I have personally reviewed following labs and imaging studies  CBC: Recent Labs  Lab 11/12/17 0507 11/14/17 1313 11/15/17 0904 11/16/17 0327 11/17/17 0749  WBC 9.7 10.0 12.2* 6.5 6.1  NEUTROABS 8.1* 9.0* 10.7*  --   --   HGB 9.2* 10.1* 9.8* 8.9* 8.8*  HCT 29.5* 32.6* 31.7* 28.5* 28.8*  MCV 91.9 91.3 89.8 90.2 91.1  PLT 325 321 275 188 162   Basic Metabolic Panel: Recent Labs  Lab 11/14/17 1313 11/15/17 0904 11/16/17 0327 11/17/17 0749 11/18/17 0541  NA 136 139 134* 136 138  K 5.0 4.2 4.6 4.2 4.7  CL 103 107 104 105 106  CO2 23 25 24 24 26   GLUCOSE 290* 84 202* 92 130*  BUN 43* 37* 29* 26* 22*  CREATININE 2.29* 2.02* 1.85* 1.66* 1.68*  CALCIUM 8.6* 8.6* 8.1* 8.3* 8.6*   GFR: Estimated Creatinine Clearance: 46 mL/min (A) (by C-G formula based on SCr of 1.68 mg/dL (H)). Liver Function Tests: Recent Labs  Lab 11/14/17 1313 11/17/17 0749  AST 16 14*  ALT 11* 11*  ALKPHOS 113 87  BILITOT 0.6 0.4  PROT 6.1* 5.5*  ALBUMIN 1.8* 1.6*   No results for input(s): LIPASE, AMYLASE in the last 168 hours. No results for input(s): AMMONIA in the last 168 hours. Coagulation  Profile: Recent Labs  Lab 11/14/17 1313  INR 0.95   Cardiac Enzymes: No results for input(s): CKTOTAL, CKMB, CKMBINDEX, TROPONINI in the last 168 hours. BNP (last 3 results) No results for input(s): PROBNP in the last 8760 hours. HbA1C: No results for input(s): HGBA1C in the last 72 hours. CBG: Recent Labs  Lab 11/17/17 1212 11/17/17 1610 11/17/17 2006 11/18/17 0755 11/18/17 1148  GLUCAP 79 87 120* 163* 146*   Lipid Profile: No results for input(s): CHOL, HDL, LDLCALC, TRIG, CHOLHDL, LDLDIRECT in the last 72 hours. Thyroid Function Tests: No results for input(s): TSH, T4TOTAL, FREET4, T3FREE, THYROIDAB in  the last 72 hours. Anemia Panel: No results for input(s): VITAMINB12, FOLATE, FERRITIN, TIBC, IRON, RETICCTPCT in the last 72 hours. Sepsis Labs: Recent Labs  Lab 11/14/17 1332  LATICACIDVEN 1.02    Recent Results (from the past 240 hour(s))  Culture, blood (routine x 2)     Status: None (Preliminary result)   Collection Time: 11/14/17  1:13 PM  Result Value Ref Range Status   Specimen Description BLOOD RIGHT ANTECUBITAL  Final   Special Requests   Final    BOTTLES DRAWN AEROBIC AND ANAEROBIC Blood Culture adequate volume   Culture   Final    NO GROWTH 3 DAYS Performed at [facility], [address] [place], [place] [address]    Report Status PENDING  Incomplete  Urine culture     Status: None   Collection Time: 11/14/17  2:25 PM  Result Value Ref Range Status   Specimen Description   Final    URINE, CLEAN CATCH Performed at [facility], [address] [place], [place] [address]    Special Requests   Final    NONE Performed at [facility], [address] [place], [place] [address]    Culture   Final    NO GROWTH Performed at [facility], [address] [place], [place] [address]    Report Status 11/15/2017 FINAL  Final  Culture, blood (routine x 2)     Status: None (Preliminary result)   Collection Time: 11/14/17  5:20 PM  Result Value Ref Range Status   Specimen Description   Final    BLOOD LEFT ARM Performed at [facility], [address], [place], [place] [address]    Special Requests   Final    Blood Culture results may not be optimal due to an excessive volume of blood received in culture bottles Performed at [facility], [address], [place], [place] [address]    Culture   Final    NO GROWTH 3 DAYS Performed at [facility], [address] [place], [place] [address]    Report Status PENDING  Incomplete  C difficile quick scan w PCR reflex     Status:  None   Collection Time: 11/15/17  8:26 AM  Result Value Ref Range Status   C Diff antigen NEGATIVE NEGATIVE Final   C Diff toxin NEGATIVE NEGATIVE Final   C Diff interpretation No C. difficile detected.  Final    Comment: Performed at [facility], [address] [place], [place] [address]  MRSA PCR Screening     Status: Abnormal   Collection Time: 11/16/17 11:40 AM  Result Value Ref Range Status   MRSA by PCR POSITIVE (A) NEGATIVE Final    Comment:        The GeneXpert MRSA Assay (FDA approved  for NASAL specimens only), is one component of a comprehensive MRSA colonization surveillance program. It is not intended to diagnose MRSA infection nor to guide or monitor treatment for MRSA infections. RESULT CALLED TO, READ BACK BY AND VERIFIED WITH: LONG,M @ 1717 ON [id number] BY POTEAT,S Performed at [facility], [address], [place], [place] [address]          Radiology Studies: Dg Swallowing Func-speech Pathology  Result Date: 11/16/2017 Objective Swallowing Evaluation: Type of Study: MBS-Modified Barium Swallow Study  Patient Details Name: ERIC MORGANTI MRN: [id number] Date of Birth: [birth year] Today's Date: 11/16/2017 Time: SLP Start Time (ACUTE ONLY): 1500 -SLP Stop Time (ACUTE ONLY): 1520 SLP Time Calculation (min) (ACUTE ONLY): 20 min Past Medical History: Past Medical History: Diagnosis Date . Arthritis   "elbows, knees, legs, back" (09/24/2015) . CKD (chronic kidney disease)  . Daily headache  . Depression   "years ago" . End stage renal disease (HCC)   right arm AV graft, resolved post transplant . Hypertension  . Hypothyroid  . Immunosuppression (HCC)   secondary to renal transplant . Kidney disease 2011 . Pneumonia ~ 2007? [name] Type II diabetes mellitus (HCC)   Insulin dependant Past Surgical History: Past Surgical History: Procedure Laterality Date . AMPUTATION Left 05/11/2014  Procedure: AMPUTATION LEFT GREAT TOE;  Surgeon: Kathryne Hitch, MD;  Location: [facility];  Service: Orthopedics;  Laterality: Left; . AV FISTULA PLACEMENT Right   forearm . BACK SURGERY   . CATARACT EXTRACTION W/ INTRAOCULAR LENS  IMPLANT, BILATERAL Bilateral  . CESAREAN SECTION  07/1999 . DG AV DIALYSIS GRAFT DECLOT OR   . HIP PINNING,CANNULATED Right 06/02/2017  Procedure: CANNULATED RIGHT HIP PINNING;  Surgeon: Beverely Low, MD;  Location: [facility];  Service: Orthopedics;  Laterality: Right; . INCISION AND DRAINAGE Right 06/02/2015  Procedure: INCISION AND DRAINAGE OF RIGHT 3rd RAY RESECTION;  Surgeon: Kathryne Hitch, MD;  Location: [facility] OR;  Service: Orthopedics;  Laterality: Right; . KIDNEY TRANSPLANT Right December 16, 2009 . LUMBAR DISC SURGERY  2001 . NEPHRECTOMY TRANSPLANTED ORGAN   . TOE AMPUTATION Right   1,2 & 3rd toes. . TUBAL LIGATION  07/1999 HPI: 52 yo female adm to [facility] with HCAP.  PMH + for renal transplant, ESRD, recurrent pnas, prior smoker.  CXR indicated LUL pna - pt is s/p thoracentesis today.  Swallow evaluation ordered.  Subjective: Pt seen in radiology for MBS Assessment / Plan / Recommendation CHL IP CLINICAL IMPRESSIONS 11/16/2017 Clinical Impression Pt presents with functional oral stage swallow, however, she currently has several sores in her oral cavity which are causing discomfort with chewing solids. Her dentition is poor. Pharyngeally, pt exhibits delayed swallow reflex across consistencies, with trigger noted at the vallecular sinus. Trace penetration of thin liquids was noted during the swallow. No aspiration was seen, however, pt is at increased risk over time. Chin tuck position with thin liquids, facilitated by straight (unbent) straw, eliminated penetration of thin liquids, even with large and consecutive boluses. Given oral pain and pt request, will change diet to dys 2 with additional sauce/gravy. Thin liquids ok via straw with chin tuck. Safe swallow precautions were sent with pt back to her room. ST wil follow for assessment of  diet tolerance and education. Anticipate being able to advance solids at bedside as oral pain is decreased.  SLP Visit Diagnosis Dysphagia, oropharyngeal phase (R13.12) Impact on safety and function Mild aspiration risk   CHL IP TREATMENT RECOMMENDATION 11/16/2017 Treatment Recommendations Therapy as  outlined in treatment plan below   Prognosis 11/16/2017 Prognosis for Safe Diet Advancement Good CHL IP DIET RECOMMENDATION 11/16/2017 SLP Diet Recommendations Dysphagia 2 (Fine chop) solids;Thin liquid Liquid Administration via Straw Medication Administration Whole meds with liquid Compensations Minimize environmental distractions;Slow rate;Small sips/bites;Use straw to facilitate chin tuck;Chin tuck Postural Changes Seated upright at 90 degrees   CHL IP OTHER RECOMMENDATIONS 11/16/2017 Oral Care Recommendations Oral care QID   CHL IP FOLLOW UP RECOMMENDATIONS 11/16/2017 Follow up Recommendations None   CHL IP FREQUENCY AND DURATION 11/16/2017 Speech Therapy Frequency (ACUTE ONLY) min 1 x/week Treatment Duration 2 weeks      CHL IP ORAL PHASE 11/16/2017 Oral Phase WFL  CHL IP PHARYNGEAL PHASE 11/16/2017 Pharyngeal Phase Impaired Pharyngeal- Thin Cup Penetration/Aspiration during swallow;Delayed swallow initiation-vallecula Pharyngeal Material enters airway, remains ABOVE vocal cords and not ejected out Pharyngeal- Thin Straw Delayed swallow initiation-vallecula Pharyngeal- Puree Delayed swallow initiation-vallecula Pharyngeal- Mechanical Soft Delayed swallow initiation-vallecula  CHL IP CERVICAL ESOPHAGEAL PHASE 11/16/2017 Cervical Esophageal Phase [facility] Celia B. Murvin Natal [facility], CCC-SLP Speech Language Pathologist [phone] Leigh Aurora 11/16/2017, 3:47 PM                   Scheduled Meds: . carvedilol  25 mg Oral BID WC  . diphenoxylate-atropine  1 tablet Oral TID  . enoxaparin (LOVENOX) injection  40 mg Subcutaneous Q24H  . HYDROcodone-homatropine  5 mL Oral Q6H  . insulin aspart  0-15 Units Subcutaneous TID WC   . insulin aspart protamine- aspart  10 Units Subcutaneous BID WC  . levothyroxine  137 mcg Oral QAC breakfast  . mycophenolate  500 mg Oral BID  . nystatin   Topical TID  . predniSONE  5 mg Oral Daily  . sodium bicarbonate  1,300 mg Oral BID  . tacrolimus  0.5 mg Oral QAC breakfast  . tacrolimus  1 mg Oral Q2000  . vitamin B-12  100 mcg Oral Daily   Continuous Infusions: . ceFEPime (MAXIPIME) IV Stopped (11/18/17 0930)  . vancomycin Stopped (11/18/17 1236)     LOS: 4 days    Time spent: More than 50% of that time was spent in counseling and/or coordination of care.      Meredith Leeds, MD [facility] Pager [phone]  If 7PM-7AM, please contact night-coverage [email or web address] Password [facility] 11/18/2017, 2:45 PM

## 2017-11-19 LAB — CULTURE, BLOOD (ROUTINE X 2)
Culture: NO GROWTH
Culture: NO GROWTH
SPECIAL REQUESTS: ADEQUATE

## 2017-11-19 LAB — GLUCOSE, CAPILLARY
Glucose-Capillary: 405 mg/dL — ABNORMAL HIGH (ref 65–99)
Glucose-Capillary: 282 mg/dL — ABNORMAL HIGH (ref 65–99)

## 2017-11-19 MED ORDER — INSULIN ASPART PROT & ASPART (70-30 MIX) 100 UNIT/ML ~~LOC~~ SUSP: 20.0000 [IU] | Freq: Two times a day (BID) | SUBCUTANEOUS | Status: DC

## 2017-11-19 MED ORDER — HYDROCODONE-ACETAMINOPHEN 5-325 MG PO TABS: 1.0000 | ORAL_TABLET | Freq: Four times a day (QID) | ORAL | 0 refills | Status: DC | PRN

## 2017-11-19 MED ORDER — AMLODIPINE BESYLATE 5 MG PO TABS
5.0000 mg | ORAL_TABLET | Freq: Every day | ORAL | Status: DC
  Administered 2017-11-19: 5 mg via ORAL
  Filled 2017-11-19: qty 1

## 2017-11-19 MED ORDER — AMOXICILLIN-POT CLAVULANATE 875-125 MG PO TABS: 1.0000 | ORAL_TABLET | Freq: Two times a day (BID) | ORAL | 0 refills | Status: AC

## 2017-11-19 MED ORDER — MAGIC MOUTHWASH W/LIDOCAINE: 5.0000 mL | Freq: Three times a day (TID) | ORAL | 0 refills | Status: DC | PRN

## 2017-11-19 MED ORDER — AMLODIPINE BESYLATE 5 MG PO TABS: 5.0000 mg | ORAL_TABLET | Freq: Every day | ORAL | 0 refills | Status: DC

## 2017-11-19 NOTE — Progress Notes (Signed)
Inpatient Diabetes Program Recommendations  AACE/ADA: New Consensus Statement on Inpatient Glycemic Control (2015)  Target Ranges:  Prepandial:   less than 140 mg/dL      Peak postprandial:   less than 180 mg/dL (1-2 hours)      Critically ill patients:  140 - 180 mg/dL   Lab Results  Component Value Date   GLUCAP 282 (H) 11/19/2017   HGBA1C 8.9 (H) 11/11/2017    Review of Glycemic Control  Blood sugars 146-405 mg/dL. Very sensitive to insulin.  Inpatient Diabetes Program Recommendations:     Decrease 70/30 to 15 units bid Decrease Novolog to 0-9 units tidwc  Continue to follow.  Thank you. [name], RD, LDN, CDE Inpatient Diabetes Coordinator [phone]

## 2017-11-19 NOTE — Progress Notes (Signed)
Attempted to call report to SNF twice, but never got connected.  Left message with receptionist Sherrie and gave her nurse phone number so they could call.

## 2017-11-19 NOTE — Clinical Social Work Placement (Addendum)
    1:45 PM [facility].  LCSW confirmed bed with facility. Room 130B  LCSW faxed dc docs to facility.  LCSW notified family of transport.   Patient will transfer by PTAR.   RN report#: [id number]. Please report that patient will continue 2L of oxygen.   BKJ  CLINICAL SOCIAL WORK PLACEMENT  NOTE  Date:  11/19/2017  Patient Details  Name: [name] MRN: [id number] Date of Birth: 03-26-1966  Clinical Social Work is seeking post-discharge placement for this patient at the Skilled  Nursing Facility level of care (*CSW will initial, date and re-position this form in  chart as items are completed):  Yes   Patient/family provided with [facility] Clinical Social Work Department's list of facilities offering this level of care within the geographic area requested by the patient (or if unable, by the patient's family).  Yes   Patient/family informed of their freedom to choose among providers that offer the needed level of care, that participate in Medicare, Medicaid or managed care program needed by the patient, have an available bed and are willing to accept the patient.  Yes   Patient/family informed of 's ownership interest in [facility] and [facility], as well as of the fact that they are under no obligation to receive care at these facilities.  PASRR submitted to EDS on       PASRR number received on       Existing PASRR number confirmed on 11/18/17     FL2 transmitted to all facilities in geographic area requested by pt/family on 11/18/17     FL2 transmitted to all facilities within larger geographic area on       Patient informed that his/her managed care company has contracts with or will negotiate with certain facilities, including the following:            Patient/family informed of bed offers received.  [facility]     Physician recommends and patient chooses  bed at      [name] 11/19/17.  Patient to be transferred to facility by EMS     Patient family notified on 11/19/17 of transfer.  Name of family member notified:  Casimiro NeedleMichael, spouse     PHYSICIAN Please sign FL2, Please prepare priority discharge summary, including medications     Additional Comment:    _______________________________________________ Coralyn HellingBernette Noelie Renfrow, LCSW 11/19/2017, 1:45 PM

## 2017-11-19 NOTE — Progress Notes (Signed)
OT Cancellation Note  Patient Details Name: [name] MRN: [id number] DOB: 1966/06/10   Cancelled Treatment:    Reason Eval/Treat Not Completed: Other (comment)  Pt reports she is too tired to participate in OT  [facility],Ayana Imhof 11/19/2017, 1:04 PM  Marica OtterMaryellen Jarvin Ogren, OTR/L [phone] 11/19/2017

## 2017-11-19 NOTE — Discharge Summary (Signed)
Physician Discharge Summary  [name] ZOX:[id number] DOB: 04/26/1966 DOA: 11/14/2017  PCP: Dois Davenport, MD  Admit date: 11/14/2017 Discharge date: 11/19/2017  Admitted From: Home Disposition:  SNF  Discharge Condition: Stable CODE STATUS: Full Diet recommendation: Heart Healthy  Brief/Interim Summary: [name] a 52 y.o.femalewith medical history significant ofstage III CKD, status post renal transplant on immunosuppression, hypertension, hypothyroidism, type 2 diabetes mellitus insulin-dependent diabetes, GERD, recently admitted for cellulitis of the lower extremities and discharged on 8 March comes in for shortness of breath.She was  having  left-sided chest pressure on deep inspiration and productive cough. Chest x-ray showed left-sided pneumonia.CT scan showed multifocal pneumonia with moderate left-sided pleural effusion.  Patient admitted for the management of healthcare associated pneumonia and left-sided pleural effusion .Patient underwent left thoracentesis on 3/11 yielding 1 L of transudative fluid.  She was treated with IV antibiotics during her inpatient stay.  Will change antibiotics to oral on discharge. Patient has  bilateral lower extremity cellulitis which is improving.  She needs to follow-up with wound care as an outpatient. Respiratory status has continued to improve.  Physical therapy evaluated  the patient and recommend discharge to SNF.  Social worker on board.   Following problems were addressed during hospitalization:  Acute respiratory failure with hypoxia secondary to multifocal pneumonia/ health care associated pneumonia and left sided mod effusion: InitiallyAdmitted to step down req high flow oxygen at 12/min.  S/p thoracentesis of 1 lit of clear light fluid and fluid sent for analysis.Transudative in nature Oxygen requirements have improved.She looks comfortable today. Because she is immunocompromised, she wasstarted on broad  spectrum antibiotics.  Currently she is on vancomycin and cefepime.  Blood cultures from 3/10:no growthso far.  Gram stain, sputum culture:No result yet. Urine for strep pneumonia negative. Respiratory status is stable. Antibiotics changed to oral on DC.  Diarrhea, suspect antibiotic induced diarrhea.  Cdiff pcr is negative.  Diarrhoea has improved .   Bilateral lower extremity cellulitis  Continue antibiotics  Venous Doppler on 11/05/17 showed an enlarged lymph node in the groin on the right and left, without any evidence of deep venous thrombosis of the lower extremities. She was discharged on 11/12/17  for the treatment of bilateral lower extremity cellulitis.  Cellulitis on bilateral lower extremities on the dorsal surface have improved significantly.  She has an area of tender, erythematous cellulitic area on her left calf which is also improving. She is supposed to follow-up with wound care as an outpatient on March 19,8 am  Chronic diastolic heart failure:  2-D Echo done on 3/11 showed EF of 60-65% with no regional wall motion abnormalities, grade 2 diastolic dysfunction.. No significant change since 04/2016 S/p thoracentesis  History of stage III CKD, history of kidney transplant on chronicimmunosuppression: Baseline creatinine around 2. Continuepatient's home dose of Prograf and prednisone.Creatinine on baseline  Anemia of chronic disease:  Stable .  Type 2 DM, insulin dependent: Currently on NovoLog 70/30,10 units twice a day. Follow Accu-Cheks closely Most recent hemoglobin A1c on 11/11/17 was 8.9  Hypertension:  Continue current meds    Discharge Diagnoses:  Principal Problem:   HCAP (healthcare-associated pneumonia) Active Problems:   Immunosuppression (HCC)   Hypothyroidism   H/O kidney transplant   AKI (acute kidney injury) (HCC)   Diabetic foot infection (HCC)   Essential hypertension   GERD (gastroesophageal reflux disease)   Diarrhea    Insulin dependent diabetes mellitus (HCC)   CKD (chronic kidney disease) stage 3, GFR 30-59 ml/min (HCC)  Anemia of chronic disease   Type II diabetes mellitus (HCC)   Status post kidney transplant   Pneumonia   Acute on chronic congestive heart failure [facility])    Discharge Instructions  Discharge Instructions    Diet - low sodium heart healthy   Complete by:  As directed    Discharge instructions   Complete by:  As directed    1) Check CBC and BMP in a week. 2) Take prescribed medications as instructed. 3)Follow up with outpatient wound care clinic for the evaluation of your bilateral lower extremity cellulitis.   Increase activity slowly   Complete by:  As directed      Allergies as of 11/19/2017      Reactions   Latex Itching   Ace Inhibitors Cough   Adhesive [tape] Itching, Other (See Comments)   Please use paper tape   Lisinopril Cough      Medication List    STOP taking these medications   doxycycline 100 MG tablet Commonly known as:  VIBRA-TABS     TAKE these medications   acetaminophen 325 MG tablet Commonly known as:  TYLENOL Take 1 tablet (325 mg total) by mouth every 6 (six) hours as needed for mild pain, moderate pain or headache.   amLODipine 5 MG tablet Commonly known as:  NORVASC Take 1 tablet (5 mg total) by mouth daily. Start taking on:  11/20/2017   amoxicillin-clavulanate 875-125 MG tablet Commonly known as:  AUGMENTIN Take 1 tablet by mouth 2 (two) times daily for 5 days.   carvedilol 25 MG tablet Commonly known as:  COREG Take 1 tablet (25 mg total) by mouth 2 (two) times daily with a meal.   FREESTYLE LIBRE READER Devi 1 each by Does not apply route 4 (four) times daily.   FREESTYLE LIBRE SENSOR SYSTEM Misc 1 sensor every 10 days   HYDROcodone-acetaminophen 5-325 MG tablet Commonly known as:  NORCO Take 1 tablet by mouth every 6 (six) hours as needed for moderate pain or severe pain.   lip balm Oint Apply 1 application topically  as needed for lip care.   magic mouthwash w/lidocaine Soln Take 5 mLs by mouth 3 (three) times daily as needed for mouth pain (spit after rinsing).   mycophenolate 250 MG capsule Commonly known as:  CELLCEPT Take 500 mg by mouth 2 (two) times daily.   NOVOLIN 70/30 FLEXPEN RELION (70-30) 100 UNIT/ML PEN Generic drug:  Insulin Isophane & Regular Human Inject 20 Units into the skin 2 (two) times daily before a meal. PER SLIDING SCALE   nystatin powder Commonly known as:  MYCOSTATIN/NYSTOP Apply topically 3 (three) times daily.   predniSONE 5 MG tablet Commonly known as:  DELTASONE Take 5 mg by mouth daily.   senna-docusate 8.6-50 MG tablet Commonly known as:  Senokot-S Take 1 tablet by mouth at bedtime.   sodium bicarbonate 650 MG tablet Take 2 tablets (1,300 mg total) by mouth 2 (two) times daily.   SYNTHROID 137 MCG tablet Generic drug:  levothyroxine Take 137 mcg by mouth daily.   tacrolimus 1 MG capsule Commonly known as:  PROGRAF Take 1 capsule (1 mg total) by mouth daily before breakfast. What changed:    how much to take  additional instructions   vitamin B-12 100 MCG tablet Commonly known as:  CYANOCOBALAMIN Take 1 tablet (100 mcg total) by mouth daily.      Follow-up Information    Dois Davenport, MD. Schedule an appointment as soon as possible  for a visit in 1 week(s).   Specialty:  Family Medicine Contact information: [address] [place] [address] [phone]          Allergies  Allergen Reactions  . Latex Itching  . Ace Inhibitors Cough  . Adhesive [Tape] Itching and Other (See Comments)    Please use paper tape  . Lisinopril Cough    Consultations: None  Procedures/Studies: Ct Abdomen Pelvis Wo Contrast  Result Date: 11/05/2017 CLINICAL DATA:  BILATERAL flank and back pain question kidney stone, history of renal transplant, diabetes mellitus, former smoker EXAM: CT ABDOMEN AND PELVIS WITHOUT CONTRAST TECHNIQUE:  Multidetector CT imaging of the abdomen and pelvis was performed following the standard protocol without IV contrast. Sagittal and coronal MPR images reconstructed from axial data set. COMPARISON:  04/10/2016 FINDINGS: Lower chest: Subsegmental atelectasis at LEFT base. Hepatobiliary: Focal fatty infiltration of the liver. Otherwise normal appearance of liver and gallbladder. Pancreas: Atrophic, grossly unremarkable Spleen: Normal appearance Adrenals/Urinary Tract: Thickening of adrenal glands without discrete mass. Markedly atrophic native kidneys. Transplant kidney RIGHT iliac fossa without gross mass or hydronephrosis. Transplant ureter and bladder unremarkable. Stomach/Bowel: Stomach and bowel loops unremarkable appearance for technique. Vascular/Lymphatic: Extensive atherosclerotic calcifications of both abdominal aorta and multiple branch vessels. Aorta normal caliber. No adenopathy. Reproductive: Normal appearing uterus and adnexa Other: No free air or free fluid. Diffuse soft tissue edema both of abdominal wall and intra-abdominal planes question related to fluid overload or hypoproteinemia. Tiny umbilical hernia containing fat. Musculoskeletal: Diffusely demineralized. Degenerative disc disease changes lumbar spine. Cannulated screws at proximal RIGHT femur. IMPRESSION: No acute intra-abdominal or intrapelvic abnormalities. Atrophic native kidneys with unremarkable appearing transplant kidney in RIGHT iliac fossa. Scattered soft tissue edema question related to fluid overload or hypoproteinemia. Electronically Signed   By: Ulyses Southward M.D.   On: 11/05/2017 16:28   Ct Chest Wo Contrast  Result Date: 11/14/2017 CLINICAL DATA:  Short of breath for several days.  No chest pain. EXAM: CT CHEST WITHOUT CONTRAST TECHNIQUE: Multidetector CT imaging of the chest was performed following the standard protocol without IV contrast. COMPARISON:  Current chest radiographs. FINDINGS: Cardiovascular: Heart is normal in  size and configuration. No pericardial effusion. There are three-vessel coronary artery calcifications. Great vessels are normal in caliber. Mild aortic atherosclerotic calcifications. Mediastinum/Nodes: Hypoattenuating nodule from the inferior aspect of the thyroid measuring 2.5 cm. No neck base or axillary masses or pathologically enlarged lymph nodes. Several prominent mediastinal lymph nodes, largest a left paratracheal node adjacent to the AP window measuring 14 mm in short axis. No mediastinal or hilar masses. No discrete enlarged or prominent hilar lymph nodes. Trachea is normal in caliber and widely patent. Esophagus is unremarkable. Lungs/Pleura: Moderate left and small right pleural effusions. There are bilateral areas of patchy ground-glass and more confluent lung consolidation, most evident in the left upper lobe. Additional opacity is noted in the left lower lobe which is likely a combination of infection and atelectasis. There is a discrete pleural based nodule in the right lower lobe, image 127, series 7, measuring 9 mm. No pneumothorax. Upper Abdomen: Area of hypoattenuation noted in the liver adjacent to the falciform ligament consistent with focal fatty infiltration. There is marked atrophy of the visualize kidneys. Diffuse vascular calcifications are noted. No acute findings. Musculoskeletal: No fracture or acute finding. No osteoblastic or osteolytic lesions. There is surrounding subcutaneous soft tissue edema. IMPRESSION: 1. Findings are consistent with multifocal pneumonia, most evident in the left upper lobe. There  are associated moderate left and small right pleural effusions. 2. Prominent to borderline enlarged mediastinal lymph nodes are presumed reactive given the changes in the lungs. 3. Coronary artery calcifications and mild aortic atherosclerosis. Aortic Atherosclerosis (ICD10-I70.0). Electronically Signed   By: Amie Portland M.D.   On: 11/14/2017 16:06   US Renal  Result Date:  11/05/2017 CLINICAL DATA:  Acute kidney injury, transplanted kidney EXAM: ULTRASOUND OF RENAL TRANSPLANT TECHNIQUE: Ultrasound examination of the renal transplant was performed with gray-scale and color Doppler evaluation. COMPARISON:  CT 11/05/2017, ultrasound 06/05/2015 FINDINGS: Transplant kidney location:  Right lower quadrant Transplant kidney description: Length: 12 cm. Within normal limits in parenchymal echogenicity. No evidence of mass or hydronephrosis. No peri-transplant fluid collection seen. Color flow in the main renal artery at the hilum:  Visualized Color flow in the main renal vein at the hilum:  Visualized Bladder: Foley catheter in the bladder. Other findings: Atrophic echogenic native right kidney. Poorly visible native left kidney. IMPRESSION: Negative ultrasound appearance of the right lower quadrant transplant kidney. Electronically Signed   By: Jasmine Pang M.D.   On: 11/05/2017 21:35   Mr Hip Right Wo Contrast  Result Date: 11/08/2017 CLINICAL DATA:  Bilateral lower extremity pain, swelling and erythema for several days. Patient reports chronic lower extremity edema. History of chronic kidney disease, diabetes, renal transplant and hypertension. EXAM: MR OF THE RIGHT HIP WITHOUT CONTRAST TECHNIQUE: Multiplanar, multisequence MR imaging was performed. No intravenous contrast was administered. COMPARISON:  Lower extremity CT 11/07/2017.  Pelvic CT 11/05/2017. FINDINGS: Bones: Status post fixation of the right femoral neck with 3 cortical screws. The hardware is in stable position. No displaced fracture. There is ill-defined low T1 and high T2 signal in the left superior pubic ramus, best seen on coronal images 17 and 18, likely a nondisplaced fracture. The inferior pubic ramus appears intact. The sacrum and sacroiliac joints appear intact. There are mild degenerative changes of both hips with small bilateral hip joint effusions. No evidence of femoral head avascular necrosis. Articular  cartilage and labrum Articular cartilage:  No focal chondral defect. Labrum:  No gross labral tear or paralabral cyst. Joint or bursal effusion Joint effusion:  Small bilateral hip joint effusions. Bursae: No focal bursal fluid collection. Muscles and tendons Muscles and tendons: Generalized edema throughout the visualized pelvic and proximal thigh musculature. No focal intramuscular fluid collection or acute tendon findings identified. Other findings Miscellaneous: As on previous CT, generalized anasarca with diffuse edema throughout the subcutaneous and pelvic fat. Small amount of pelvic ascites. Transplant kidney in the right iliac fossa appears unchanged. IMPRESSION: 1. Nonspecific generalized soft tissue edema involving the musculature and subcutaneous and deep fat, consistent with nonspecific anasarca. Ascites without focal fluid collection. 2. Previous right hip pinning without displaced fracture. Marrow edema in the left superior pubic ramus suspicious for a nondisplaced fracture. The pelvis otherwise appears intact. Electronically Signed   By: Carey Bullocks M.D.   On: 11/08/2017 17:18   Nm Pulmonary Perf And Vent  Result Date: 11/05/2017 CLINICAL DATA:  Elevated D-dimer, DVT in BILATERAL lower extremities, smoker EXAM: NUCLEAR MEDICINE VENTILATION - PERFUSION LUNG SCAN TECHNIQUE: Ventilation images were obtained in multiple projections using inhaled aerosol Tc-6m DTPA. Perfusion images were obtained in multiple projections after intravenous injection of Tc-75m-MAA. RADIOPHARMACEUTICALS:  32.4 mCi of Tc-35m DTPA aerosol inhalation and 4.36 mCi Tc44m-MAA IV COMPARISON:  None. FINDINGS: Ventilation: Central airway deposition of aerosol. Slightly diminished diffuse peripheral distribution of aerosol. No definite focal ventilation defects. Perfusion: Normal  IMPRESSION: Normal perfusion lung scan. Electronically Signed   By: Ulyses Southward M.D.   On: 11/05/2017 15:53   Dg Chest Port 1 View  Result Date:  11/15/2017 CLINICAL DATA:  Status post left thoracentesis. EXAM: PORTABLE CHEST 1 VIEW COMPARISON:  Radiograph of November 14, 2017. FINDINGS: The heart size and mediastinal contours are within normal limits. No pneumothorax is noted. Right lung is clear. Left pleural effusion noted on prior exam is nearly resolved. Mild ill-defined left midlung opacity is noted suggesting inflammation or atelectasis. The visualized skeletal structures are unremarkable. IMPRESSION: No pneumothorax status post thoracentesis. Left pleural effusion is nearly resolved. Electronically Signed   By: Lupita Raider, M.D.   On: 11/15/2017 12:32   Dg Chest Port 1 View  Result Date: 11/14/2017 CLINICAL DATA:  Shortness of breath EXAM: PORTABLE CHEST 1 VIEW COMPARISON:  11/06/2017 FINDINGS: Extensive airspace disease on the left with some volume loss. Normal heart size when accounting for technique. The right lung is relatively clear. No definite effusion. No pneumothorax. IMPRESSION: Extensive left-sided airspace disease consistent with pneumonia in the clinical appropriate setting. Electronically Signed   By: Marnee Spring M.D.   On: 11/14/2017 13:33   Dg Chest Port 1 View  Result Date: 11/06/2017 CLINICAL DATA:  52 year old female admitted with progressive lower extremity edema, pain and erythema. Acute on chronic renal failure status post renal transplant. EXAM: PORTABLE CHEST 1 VIEW COMPARISON:  CT Abdomen and Pelvis 11/05/2017. 09/01/2017 and earlier. FINDINGS: Portable AP semi upright view at 0436 hours. Mild elevation of the right hemidiaphragm. Allowing for portable technique the lungs are clear. Mediastinal contours remain normal. Visualized tracheal air column is within normal limits. No acute osseous abnormality identified. IMPRESSION: No acute cardiopulmonary abnormality. Electronically Signed   By: Odessa Fleming M.D.   On: 11/06/2017 06:41   Dg Swallowing Func-speech Pathology  Result Date: 11/16/2017 Objective Swallowing  Evaluation: Type of Study: MBS-Modified Barium Swallow Study  Patient Details Name: MENA SIMONIS MRN: [id number] Date of Birth: [birth year] Today's Date: 11/16/2017 Time: SLP Start Time (ACUTE ONLY): 1500 -SLP Stop Time (ACUTE ONLY): 1520 SLP Time Calculation (min) (ACUTE ONLY): 20 min Past Medical History: Past Medical History: Diagnosis Date . Arthritis   "elbows, knees, legs, back" (09/24/2015) . CKD (chronic kidney disease)  . Daily headache  . Depression   "years ago" . End stage renal disease (HCC)   right arm AV graft, resolved post transplant . Hypertension  . Hypothyroid  . Immunosuppression (HCC)   secondary to renal transplant . Kidney disease 2011 . Pneumonia ~ 2007? [name] Type II diabetes mellitus (HCC)   Insulin dependant Past Surgical History: Past Surgical History: Procedure Laterality Date . AMPUTATION Left 05/11/2014  Procedure: AMPUTATION LEFT GREAT TOE;  Surgeon: Kathryne Hitch, MD;  Location: [facility];  Service: Orthopedics;  Laterality: Left; . AV FISTULA PLACEMENT Right   forearm . BACK SURGERY   . CATARACT EXTRACTION W/ INTRAOCULAR LENS  IMPLANT, BILATERAL Bilateral  . CESAREAN SECTION  07/1999 . DG AV DIALYSIS GRAFT DECLOT OR   . HIP PINNING,CANNULATED Right 06/02/2017  Procedure: CANNULATED RIGHT HIP PINNING;  Surgeon: Beverely Low, MD;  Location: [facility];  Service: Orthopedics;  Laterality: Right; . INCISION AND DRAINAGE Right 06/02/2015  Procedure: INCISION AND DRAINAGE OF RIGHT 3rd RAY RESECTION;  Surgeon: Kathryne Hitch, MD;  Location: [facility] OR;  Service: Orthopedics;  Laterality: Right; . KIDNEY TRANSPLANT Right December 16, 2009 . LUMBAR DISC SURGERY  2001 . NEPHRECTOMY TRANSPLANTED ORGAN   .  TOE AMPUTATION Right   1,2 & 3rd toes. . TUBAL LIGATION  07/1999 HPI: 52 yo female adm to [facility] with HCAP.  PMH + for renal transplant, ESRD, recurrent pnas, prior smoker.  CXR indicated LUL pna - pt is s/p thoracentesis today.  Swallow evaluation ordered.  Subjective: Pt seen in radiology for MBS  Assessment / Plan / Recommendation CHL IP CLINICAL IMPRESSIONS 11/16/2017 Clinical Impression Pt presents with functional oral stage swallow, however, she currently has several sores in her oral cavity which are causing discomfort with chewing solids. Her dentition is poor. Pharyngeally, pt exhibits delayed swallow reflex across consistencies, with trigger noted at the vallecular sinus. Trace penetration of thin liquids was noted during the swallow. No aspiration was seen, however, pt is at increased risk over time. Chin tuck position with thin liquids, facilitated by straight (unbent) straw, eliminated penetration of thin liquids, even with large and consecutive boluses. Given oral pain and pt request, will change diet to dys 2 with additional sauce/gravy. Thin liquids ok via straw with chin tuck. Safe swallow precautions were sent with pt back to her room. ST wil follow for assessment of diet tolerance and education. Anticipate being able to advance solids at bedside as oral pain is decreased.  SLP Visit Diagnosis Dysphagia, oropharyngeal phase (R13.12) Impact on safety and function Mild aspiration risk   CHL IP TREATMENT RECOMMENDATION 11/16/2017 Treatment Recommendations Therapy as outlined in treatment plan below   Prognosis 11/16/2017 Prognosis for Safe Diet Advancement Good CHL IP DIET RECOMMENDATION 11/16/2017 SLP Diet Recommendations Dysphagia 2 (Fine chop) solids;Thin liquid Liquid Administration via Straw Medication Administration Whole meds with liquid Compensations Minimize environmental distractions;Slow rate;Small sips/bites;Use straw to facilitate chin tuck;Chin tuck Postural Changes Seated upright at 90 degrees   CHL IP OTHER RECOMMENDATIONS 11/16/2017 Oral Care Recommendations Oral care QID   CHL IP FOLLOW UP RECOMMENDATIONS 11/16/2017 Follow up Recommendations None   CHL IP FREQUENCY AND DURATION 11/16/2017 Speech Therapy Frequency (ACUTE ONLY) min 1 x/week Treatment Duration 2 weeks      CHL IP ORAL  PHASE 11/16/2017 Oral Phase WFL  CHL IP PHARYNGEAL PHASE 11/16/2017 Pharyngeal Phase Impaired Pharyngeal- Thin Cup Penetration/Aspiration during swallow;Delayed swallow initiation-vallecula Pharyngeal Material enters airway, remains ABOVE vocal cords and not ejected out Pharyngeal- Thin Straw Delayed swallow initiation-vallecula Pharyngeal- Puree Delayed swallow initiation-vallecula Pharyngeal- Mechanical Soft Delayed swallow initiation-vallecula  CHL IP CERVICAL ESOPHAGEAL PHASE 11/16/2017 Cervical Esophageal Phase [facility] Celia B. Murvin Natal, [facility], CCC-SLP Speech Language Pathologist [phone] Leigh Aurora 11/16/2017, 3:47 PM              Ct Renal Stone Study  Result Date: 11/11/2017 CLINICAL DATA:  Renal failure and metabolic acidosis EXAM: CT ABDOMEN AND PELVIS WITHOUT CONTRAST TECHNIQUE: Multidetector CT imaging of the abdomen and pelvis was performed following the standard protocol without IV contrast. COMPARISON:  11/05/2017 FINDINGS: Lower chest: Small bilateral pleural effusions are noted which are new from the prior exam. Left lower lobe mild infiltrate is noted as well new from the prior exam. Hepatobiliary: Focal fatty infiltration of the liver is noted along the falciform ligament. The gallbladder is well distended. No other focal abnormality is noted. Pancreas: Unremarkable. No pancreatic ductal dilatation or surrounding inflammatory changes. Spleen: Normal in size without focal abnormality. Adrenals/Urinary Tract: Adrenal glands are within normal limits. The native kidneys are again shrunken with diffuse renal vascular calcifications. A transplant kidney is noted in the right lower quadrant without mass lesion or hydronephrosis. No renal calculi are seen. The bladder is  well distended. Few small foci of air are noted within the bladder which may be related to recent instrumentation. Clinical correlation is recommended. Stomach/Bowel: No obstructive or inflammatory changes are noted. The appendix is within  normal limits. Vascular/Lymphatic: Aortic atherosclerosis. No enlarged abdominal or pelvic lymph nodes. Reproductive: Uterus and bilateral adnexa are unremarkable. Other: Minimal free fluid is noted within the pelvis. Changes of edema are noted within the subcutaneous tissues. Small fat containing umbilical hernia is noted. Musculoskeletal: Postsurgical changes are noted in the right hip. Degenerative changes of lumbar spine are noted. IMPRESSION: Atrophic native kidneys with normal appearing transplant kidney in the right iliac fossa. Changes consistent with subcutaneous edema No acute abnormality is noted within the abdomen and pelvis. The overall appearance is stable from the prior exam. Electronically Signed   By: Alcide CleverMark  Lukens M.D.   On: 11/11/2017 11:12   Dg Hip Unilat With Pelvis 1v Right  Result Date: 11/09/2017 CLINICAL DATA:  History of right femoral neck fracture status post ORIF. Patient reports right hip pain. EXAM: DG HIP (WITH OR WITHOUT PELVIS) 1V RIGHT COMPARISON:  06/02/2017 right hip radiographs. 11/07/2017 bilateral lower extremity CT. FINDINGS: Nearly healed fracture of the right femoral neck in anatomic alignment status post transfixation by 3 screws, with no evidence of screw fracture or loosening. No additional fracture. No right hip dislocation. No significant right hip arthropathy. Degenerative changes in the visualized lower lumbar spine. IMPRESSION: Nearly healed right femoral neck fracture in anatomic alignment status post ORIF, with no evidence of hardware complication. Electronically Signed   By: Delbert PhenixJason A Poff M.D.   On: 11/09/2017 20:12   Ct Extrem Lower Wo Cm Bil  Result Date: 11/07/2017 CLINICAL DATA:  Worsening bilateral lower extremity pain swelling and redness over the last several days. She reports having chronic lower extremity edema that fluctuates, but noticed over the last 3-4 days symptoms progressively worsened. EXAM: CT OF THE LOWER BILATERAL EXTREMITY WITHOUT  CONTRAST TECHNIQUE: Multidetector CT imaging of the lower bilateral extremity was performed according to the standard protocol. COMPARISON:  None. FINDINGS: Bones/Joint/Cartilage Generalized osteopenia. Nondisplaced fracture of the medial margin of the right femoral head neck transfixed with 3 cannulated screws. Mild osteoarthritis of the right hip. Normal alignment. No joint effusion. Knee joint spaces are maintained bilaterally. Ankle mortise is normal bilaterally. Subtalar joints are normal bilaterally. Mild osteoarthritis of bilateral sacroiliac joints. No aggressive osseous lesion. Severe degenerative disc disease with disc height loss at L4-5 and L5-S1 with severe bilateral facet arthropathy and foraminal stenosis. Ligaments Ligaments are suboptimally evaluated by CT. Muscles and Tendons Mild generalized symmetric muscle atrophy bilaterally. No intramuscular fluid collection or hematoma. No intramuscular emphysema. Soft tissue Mild soft tissue edema throughout bilateral lower extremities and pelvis most consistent with anasarca. Peripheral vascular atherosclerotic disease. 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 Foley catheter within the bladder. No soft tissue mass. IMPRESSION: 1. Mild soft tissue edema throughout bilateral lower extremities and moderate soft tissue edema in the subcutaneous fat of the pelvis and lower abdomen as can be seen with anasarca. 2. Extensive peripheral vascular atherosclerotic disease. 3. Nondisplaced fracture of the medial margin of the right femoral head neck transfixed with 3 cannulated screws. Electronically Signed   By: Elige KoHetal  Patel   On: 11/07/2017 10:06   [place] Thoracentesis Asp Pleural Space W/img Guide  Result Date: 11/15/2017 INDICATION: Patient with history of chronic kidney disease, pneumonia, dyspnea, cough, left pleural effusion. Request made for diagnostic and therapeutic left thoracentesis. EXAM: ULTRASOUND GUIDED DIAGNOSTIC AND THERAPEUTIC  LEFT THORACENTESIS  MEDICATIONS: 1% lidocaine local COMPLICATIONS: None immediate. PROCEDURE: An ultrasound guided thoracentesis was thoroughly discussed with the patient and questions answered. The benefits, risks, alternatives and complications were also discussed. The patient understands and wishes to proceed with the procedure. Written consent was obtained. Ultrasound was performed to localize and mark an adequate pocket of fluid in the left chest. The area was then prepped and draped in the normal sterile fashion. 1% Lidocaine was used for local anesthesia. Under ultrasound guidance a 6 Fr Safe-T-Centesis catheter was introduced. Thoracentesis was performed. The catheter was removed and a dressing applied. FINDINGS: A total of approximately 1 liter of clear, light yellow fluid was removed. Samples were sent to the laboratory as requested by the clinical team. IMPRESSION: Successful ultrasound guided diagnostic and therapeutic left thoracentesis yielding 1 liter of pleural fluid. Read by: Jeananne Rama, PA-C Electronically Signed   By: Judie Petit.  Shick M.D.   On: 11/15/2017 12:19    (Echo, Carotid, EGD, Colonoscopy, ERCP)    Subjective:   Discharge Exam: Vitals:   11/18/17 2210 11/19/17 0527  BP: (!) 165/76 (!) 179/97  Pulse: 76 93  Resp: 18 18  Temp: 98.6 F (37 C) 98 F (36.7 C)  SpO2: 97% 100%   Vitals:   11/18/17 0426 11/18/17 1410 11/18/17 2210 11/19/17 0527  BP: (!) 156/71 (!) 165/75 (!) 165/76 (!) 179/97  Pulse: 79 76 76 93  Resp: 19 18 18 18   Temp: 98.5 F (36.9 C) 99.3 F (37.4 C) 98.6 F (37 C) 98 F (36.7 C)  TempSrc: Oral Oral Oral Oral  SpO2: 98% 100% 97% 100%  Weight:      Height:        General: Pt is alert, awake, not in acute distress Cardiovascular: RRR, S1/S2 +, no rubs, no gallops Respiratory: CTA bilaterally, no wheezing, no rhonchi Abdominal: Soft, NT, ND, bowel sounds + Extremities: no edema, no cyanosis, healing bilateral lower extremity cellulitis.    The results of  significant diagnostics from this hospitalization (including imaging, microbiology, ancillary and laboratory) are listed below for reference.     Microbiology: Recent Results (from the past 240 hour(s))  Culture, blood (routine x 2)     Status: None   Collection Time: 11/14/17  1:13 PM  Result Value Ref Range Status   Specimen Description BLOOD RIGHT ANTECUBITAL  Final   Special Requests   Final    BOTTLES DRAWN AEROBIC AND ANAEROBIC Blood Culture adequate volume   Culture   Final    NO GROWTH 5 DAYS Performed at [facility], [address], [place], [place] [address]    Report Status 11/19/2017 FINAL  Final  Urine culture     Status: None   Collection Time: 11/14/17  2:25 PM  Result Value Ref Range Status   Specimen Description   Final    URINE, CLEAN CATCH Performed at [facility], [address], [place], [place] [address]    Special Requests   Final    NONE Performed at [facility], [address] [place], [place] [address]    Culture   Final    NO GROWTH Performed at [facility], [address], [place], [place] [address]    Report Status 11/15/2017 FINAL  Final  Culture, blood (routine x 2)     Status: None   Collection Time: 11/14/17  5:20 PM  Result Value Ref Range Status   Specimen Description   Final  BLOOD LEFT ARM Performed at [facility], [address], [place], [place] [address]    Special Requests   Final    Blood Culture results may not be optimal due to an excessive volume of blood received in culture bottles Performed at [facility], [address], [place], [place] [address]    Culture   Final    NO GROWTH 5 DAYS Performed at [facility], [address] [place], [place] [address]    Report Status 11/19/2017 FINAL  Final  C difficile quick scan w PCR reflex     Status: None   Collection Time: 11/15/17  8:26 AM  Result Value Ref Range Status   C  Diff antigen NEGATIVE NEGATIVE Final   C Diff toxin NEGATIVE NEGATIVE Final   C Diff interpretation No C. difficile detected.  Final    Comment: Performed at [facility], [address], [place], [place] [address]  MRSA PCR Screening     Status: Abnormal   Collection Time: 11/16/17 11:40 AM  Result Value Ref Range Status   MRSA by PCR POSITIVE (A) NEGATIVE Final    Comment:        The GeneXpert MRSA Assay (FDA approved for NASAL specimens only), is one component of a comprehensive MRSA colonization surveillance program. It is not intended to diagnose MRSA infection nor to guide or monitor treatment for MRSA infections. RESULT CALLED TO, READ BACK BY AND VERIFIED WITH: LONG,M @ 1717 ON [id number] BY POTEAT,S Performed at [facility], [address] [place], [place] [address]      Labs: BNP (last 3 results) Recent Labs    05/09/17 0853 08/31/17 1535 11/14/17 1313  BNP 282.1* 1,022.8* 958.8*   Basic Metabolic Panel: Recent Labs  Lab 11/14/17 1313 11/15/17 0904 11/16/17 0327 11/17/17 0749 11/18/17 0541  NA 136 139 134* 136 138  K 5.0 4.2 4.6 4.2 4.7  CL 103 107 104 105 106  CO2 23 25 24 24 26   GLUCOSE 290* 84 202* 92 130*  BUN 43* 37* 29* 26* 22*  CREATININE 2.29* 2.02* 1.85* 1.66* 1.68*  CALCIUM 8.6* 8.6* 8.1* 8.3* 8.6*   Liver Function Tests: Recent Labs  Lab 11/14/17 1313 11/17/17 0749  AST 16 14*  ALT 11* 11*  ALKPHOS 113 87  BILITOT 0.6 0.4  PROT 6.1* 5.5*  ALBUMIN 1.8* 1.6*   No results for input(s): LIPASE, AMYLASE in the last 168 hours. No results for input(s): AMMONIA in the last 168 hours. CBC: Recent Labs  Lab 11/14/17 1313 11/15/17 0904 11/16/17 0327 11/17/17 0749  WBC 10.0 12.2* 6.5 6.1  NEUTROABS 9.0* 10.7*  --   --   HGB 10.1* 9.8* 8.9* 8.8*  HCT 32.6* 31.7* 28.5* 28.8*  MCV 91.3 89.8 90.2 91.1  PLT 321 275 188 162   Cardiac Enzymes: No results for input(s): CKTOTAL, CKMB, CKMBINDEX,  TROPONINI in the last 168 hours. BNP: Invalid input(s): POCBNP CBG: Recent Labs  Lab 11/18/17 1148 11/18/17 1728 11/18/17 2208 11/19/17 0736 11/19/17 1148  GLUCAP 146* 170* 310* 405* 282*   D-Dimer No results for input(s): DDIMER in the last 72 hours. Hgb A1c No results for input(s): HGBA1C in the last 72 hours. Lipid Profile No results for input(s): CHOL, HDL, LDLCALC, TRIG, CHOLHDL, LDLDIRECT in the last 72 hours. Thyroid function studies No results for input(s): TSH, T4TOTAL, T3FREE, THYROIDAB in the last 72 hours.  Invalid input(s): FREET3 Anemia work up No  results for input(s): VITAMINB12, FOLATE, FERRITIN, TIBC, IRON, RETICCTPCT in the last 72 hours. Urinalysis    Component Value Date/Time   COLORURINE YELLOW 11/14/2017 1425   APPEARANCEUR CLOUDY (A) 11/14/2017 1425   LABSPEC 1.011 11/14/2017 1425   PHURINE 6.0 11/14/2017 1425   GLUCOSEU >=500 (A) 11/14/2017 1425   HGBUR SMALL (A) 11/14/2017 1425   BILIRUBINUR NEGATIVE 11/14/2017 1425   KETONESUR 5 (A) 11/14/2017 1425   PROTEINUR 100 (A) 11/14/2017 1425   UROBILINOGEN 0.2 03/02/2015 1153   NITRITE NEGATIVE 11/14/2017 1425   LEUKOCYTESUR LARGE (A) 11/14/2017 1425   Sepsis Labs Invalid input(s): PROCALCITONIN,  WBC,  LACTICIDVEN Microbiology Recent Results (from the past 240 hour(s))  Culture, blood (routine x 2)     Status: None   Collection Time: 11/14/17  1:13 PM  Result Value Ref Range Status   Specimen Description BLOOD RIGHT ANTECUBITAL  Final   Special Requests   Final    BOTTLES DRAWN AEROBIC AND ANAEROBIC Blood Culture adequate volume   Culture   Final    NO GROWTH 5 DAYS Performed at [facility], [address] [place], [place] [address]    Report Status 11/19/2017 FINAL  Final  Urine culture     Status: None   Collection Time: 11/14/17  2:25 PM  Result Value Ref Range Status   Specimen Description   Final    URINE, CLEAN CATCH Performed at [facility], [address] [place], [place] [address]    Special Requests   Final    NONE Performed at [facility], [address] [place], [place] [address]    Culture   Final    NO GROWTH Performed at [facility], [address] [place], [place] [address]    Report Status 11/15/2017 FINAL  Final  Culture, blood (routine x 2)     Status: None   Collection Time: 11/14/17  5:20 PM  Result Value Ref Range Status   Specimen Description   Final    BLOOD LEFT ARM Performed at [facility], [address], [place], [place] [address]    Special Requests   Final    Blood Culture results may not be optimal due to an excessive volume of blood received in culture bottles Performed at [facility], [address], [place], [place] [address]    Culture   Final    NO GROWTH 5 DAYS Performed at [facility], [address] [place], [place] [address]    Report Status 11/19/2017 FINAL  Final  C difficile quick scan w PCR reflex     Status: None   Collection Time: 11/15/17  8:26 AM  Result Value Ref Range Status   C Diff antigen NEGATIVE NEGATIVE Final   C Diff toxin NEGATIVE NEGATIVE Final   C Diff interpretation No C. difficile detected.  Final    Comment: Performed at [facility], [address] [place], [place] [address]  MRSA PCR Screening     Status: Abnormal   Collection Time: 11/16/17 11:40 AM  Result Value Ref Range Status   MRSA by PCR POSITIVE (A) NEGATIVE Final    Comment:        The GeneXpert MRSA Assay (FDA approved for NASAL specimens only), is one component of a comprehensive MRSA colonization surveillance program. It is not intended to diagnose MRSA infection nor to guide or monitor treatment for MRSA infections. RESULT CALLED  TO, READ BACK BY AND VERIFIED WITH: LONG,M @ 1717 ON [id number] BY POTEAT,S Performed at [facility], [address], [place], [place] [address]       Time coordinating discharge: Over 30 minutes  SIGNED:   Meredith Leeds, MD  [facility] 11/19/2017, 12:47 PM Pager [phone]  If 7PM-7AM, please contact night-coverage [email or web address] Password [email or web address]

## 2017-11-19 NOTE — Progress Notes (Signed)
Pharmacy Antibiotic Note  [name] is a 52 y.o. female admitted on 11/14/2017 with pneumonia.  Recent hospitalization (discharged 11/12/17)  where treated for bilateral lower extremity with cellulitis (discharged on doxycycline).  PMH significant for HTN, DM, CKD s/p renal transplant.  Chest Xray concerning for pneumonia.  Pharmacy has been consulted for Vancomycin dosing.  Today, 11/19/2017 Day #6 Vancomycin/Cefepime SCr improved 2.29 > 2.02 > 1.85>1.68 WBC WNL on3/13 Afebrile All cultures negative and final. Plan to continue IV abx until dc then change to PO Levaquin  Plan: Continue vancomycin to 750mg  IV q24h  - Cefepime  1gm IV q12h   Height: 5\' 7"  (170.2 cm) Weight: 197 lb 1.5 oz (89.4 kg) IBW/kg (Calculated) : 61.6  Temp (24hrs), Avg:98.6 F (37 C), Min:98 F (36.7 C), Max:99.3 F (37.4 C)  Recent Labs  Lab 11/14/17 1313 11/14/17 1332 11/15/17 0904 11/16/17 0327 11/17/17 0749 11/18/17 0541  WBC 10.0  --  12.2* 6.5 6.1  --   CREATININE 2.29*  --  2.02* 1.85* 1.66* 1.68*  LATICACIDVEN  --  1.02  --   --   --   --     Estimated Creatinine Clearance: 46 mL/min (A) (by C-G formula based on SCr of 1.68 mg/dL (H)).    Allergies  Allergen Reactions  . Latex Itching  . Ace Inhibitors Cough  . Adhesive [Tape] Itching and Other (See Comments)    Please use paper tape  . Lisinopril Cough    Antimicrobials this admission:  3/10 cefepime >>   3/10 vanc >>    Dose adjustments this admission:  3/12 increase vanc from 1250mg  q48h to 750mg  q24h for improved SCr  Microbiology results:  3/10 BCx: ngF 3/10 UCx: NGF 3/11 C.diff: neg 3/11 Urine strep Ag: neg  Thank you for allowing pharmacy to be a part of this patient's care.  Herby AbrahamMichelle T. Ismaeel Arvelo, Pharm.D. [phone] 11/19/2017 12:17 PM

## 2017-11-19 NOTE — Progress Notes (Signed)
OT Cancellation Note  Patient Details Name: [name] MRN: [id number] DOB: May 02, 1966   Cancelled Treatment:    Reason Eval/Treat Not Completed: Other (comment).  Pt does not feel up to getting OOB at this time.  She requests that I return later. If schedule, permits, I will do this.  Yamina Lenis 11/19/2017, 10:17 AM  Marica OtterMaryellen Lanie Schelling, OTR/L [phone] 11/19/2017

## 2017-11-19 NOTE — Care Management Note (Signed)
Case Management Note  Patient Details  Name: [name] MRN: [id number] Date of Birth: 18-May-1966  Subjective/Objective: Patient has gone back & forth about SNF vs Home w/HHC. She confirmed with me that she agrees to SNF-CSW notified.                   Action/Plan:d/c SNF.   Expected Discharge Date:                  Expected Discharge Plan:  Skilled Nursing Facility  In-House Referral:  Clinical Social Work  Discharge planning Services  CM Consult  Post Acute Care Choice:    Choice offered to:     DME Arranged:    DME Agency:     HH Arranged:    HH Agency:     Status of Service:  Completed, signed off  If discussed at [employer] Meetings, dates discussed:    Additional Comments:  Lanier ClamMahabir, Derriona Branscom, RN 11/19/2017, 11:08 AM

## 2017-11-22 ENCOUNTER — Encounter: Payer: Self-pay | Admitting: Internal Medicine

## 2017-11-22 ENCOUNTER — Non-Acute Institutional Stay (SKILLED_NURSING_FACILITY): Payer: Medicare Other | Admitting: Internal Medicine

## 2017-11-22 DIAGNOSIS — I10 Essential (primary) hypertension: Secondary | ICD-10-CM | POA: Diagnosis not present

## 2017-11-22 DIAGNOSIS — L03119 Cellulitis of unspecified part of limb: Secondary | ICD-10-CM

## 2017-11-22 DIAGNOSIS — J189 Pneumonia, unspecified organism: Secondary | ICD-10-CM

## 2017-11-22 DIAGNOSIS — I5032 Chronic diastolic (congestive) heart failure: Secondary | ICD-10-CM | POA: Diagnosis not present

## 2017-11-22 DIAGNOSIS — E119 Type 2 diabetes mellitus without complications: Secondary | ICD-10-CM

## 2017-11-22 DIAGNOSIS — Z794 Long term (current) use of insulin: Secondary | ICD-10-CM | POA: Diagnosis not present

## 2017-11-22 DIAGNOSIS — E034 Atrophy of thyroid (acquired): Secondary | ICD-10-CM

## 2017-11-22 DIAGNOSIS — IMO0001 Reserved for inherently not codable concepts without codable children: Secondary | ICD-10-CM

## 2017-11-22 NOTE — Progress Notes (Signed)
Patient ID: [name], female   DOB: 10/23/1965, 52 y.o.   MRN: [id number]  Provider:  DR Elmon Kirschner Location:  [facility] Room Number: 130 A Place of Service:  SNF (31)  PCP: Dois Davenport, MD Patient Care Team: Dois Davenport, MD as PCP - General (Family Medicine)  Extended Emergency Contact Information Primary Emergency Contact: Prohaska,Michael Address: [address]          [address]          [place], [place] [address] [place] Home Phone: [phone] Mobile Phone: [phone] Relation: Spouse  Code Status: Full Code Goals of Care: Advanced Directive information Advanced Directives 11/22/2017  Does Patient Have a Medical Advance Directive? No  Would patient like information on creating a medical advance directive? No - Patient declined  Pre-existing out of facility DNR order (yellow form or pink MOST form) -     Chief Complaint  Patient presents with  . New Admit To SNF    Admission    HPI: Patient is a 52 y.o. female seen today for admission to SNF following hospital stay for acute respiratory failure  With hypoxia 2/2 multifocal pneumonia/HCAP/left pleural effusion, abx associated diarrhea, BLE cellulitis. She has been hospitalized x 12 since Jan 2018. This admission, she underwent thoracentesis --> 1 L fluid removed; analysis revealed transudative. She was tx with IV vanco/cefepime. Diarrhea was C diff neg. BLE venous doppler was neg for DVT. 2D echo with nml EF 60-65% and grade 2 DD. A1c 8.9%. She presents to SNF for short term rehab.  She c/o cough and is c/a LE wound and loose stools. CBGs 70-220s, occasional 390s. No low BS reactions. Appetite reduced; sleeps well. No falls.  B12 deficiency - takes po B12 daily; last B12 level 133  Hypothyroidism - takes levothyroxine. TSH 8.86  DM - type 1; uncontrolled. A1c 8.9%. She takes insulin  Stage 3 CKD with hx renal transplant - on cellcept; followed by nephrology; Cr  1.68  Past Medical History:  Diagnosis Date  . Arthritis    "elbows, knees, legs, back" (09/24/2015)  . CKD (chronic kidney disease)   . Daily headache   . Depression    "years ago"  . End stage renal disease (HCC)    right arm AV graft, resolved post transplant  . Hypertension   . Hypothyroid   . 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 Kidney disease 2011  . Pneumonia ~ 2007?  [name] Type II diabetes mellitus (HCC)    Insulin dependant   Past Surgical History:  Procedure Laterality Date  . AMPUTATION Left 05/11/2014   Procedure: AMPUTATION LEFT GREAT TOE;  Surgeon: Kathryne Hitch, MD;  Location: [facility];  Service: Orthopedics;  Laterality: Left;  . AV FISTULA PLACEMENT Right    forearm  . BACK SURGERY    . CATARACT EXTRACTION W/ INTRAOCULAR LENS  IMPLANT, BILATERAL Bilateral   . CESAREAN SECTION  07/1999  . DG AV DIALYSIS GRAFT DECLOT OR    . HIP PINNING,CANNULATED Right 06/02/2017   Procedure: CANNULATED RIGHT HIP PINNING;  Surgeon: Beverely Low, MD;  Location: [facility];  Service: Orthopedics;  Laterality: Right;  . INCISION AND DRAINAGE Right 06/02/2015   Procedure: INCISION AND DRAINAGE OF RIGHT 3rd RAY RESECTION;  Surgeon: Kathryne Hitch, MD;  Location: [facility];  Service: Orthopedics;  Laterality: Right;  . 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 LUMBAR DISC SURGERY  2001  . NEPHRECTOMY TRANSPLANTED ORGAN    . TOE AMPUTATION Right    1,2 & 3rd toes.  . TUBAL LIGATION  07/1999    reports that she quit smoking about 19 years ago. Her smoking use included cigarettes. She has a 11.00 pack-year smoking history. she has never used smokeless tobacco. She reports that she does not drink alcohol or use drugs. Social History   Socioeconomic History  . Marital status: Married    Spouse name: Not on file  . Number of children: Not on file  . Years of education: Not on file  . Highest education level: Not on file  Social Needs  . Financial resource  strain: Not on file  . Food insecurity - worry: Not on file  . Food insecurity - inability: Not on file  . Transportation needs - medical: Not on file  . Transportation needs - non-medical: Not on file  Occupational History  . Occupation: disabled  Tobacco Use  . Smoking status: Former Smoker    Packs/day: 1.00    Years: 11.00    Pack years: 11.00    Types: Cigarettes    Last attempt to quit: 09/21/1998    Years since quitting: 19.1  . Smokeless tobacco: Never Used  Substance and Sexual Activity  . Alcohol use: No  . Drug use: No  . Sexual activity: No  Other Topics Concern  . Not on file  Social History Narrative  . Not on file    Functional Status Survey:    Family History  Problem Relation Age of Onset  . Emphysema Mother   . Throat cancer Mother   . COPD Mother   . Cancer Mother   . Emphysema Father   . COPD Father   . Stroke Father   . ADD / ADHD Son     Health Maintenance  Topic Date Due  . PNEUMOCOCCAL POLYSACCHARIDE VACCINE (2) 11/23/2018 (Originally 12/30/2015)  . INFLUENZA VACCINE  11/23/2018 (Originally 04/07/2017)  . FOOT EXAM  11/23/2018 (Originally 02/19/1977)  . MAMMOGRAM  11/23/2018 (Originally 02/19/2017)  . PAP SMEAR  11/23/2018 (Originally 02/20/1988)  . OPHTHALMOLOGY EXAM  11/23/2018 (Originally 02/19/1977)  . URINE MICROALBUMIN  11/23/2018 (Originally 02/19/1977)  . COLONOSCOPY  11/23/2018 (Originally 02/19/2017)  . HEMOGLOBIN A1C  02/11/2018  . LIPID PANEL  11/06/2018  . TETANUS/TDAP  05/22/2025  . HIV Screening  Completed    Allergies  Allergen Reactions  . Latex Itching  . Ace Inhibitors Cough  . Adhesive [Tape] Itching and Other (See Comments)    Please use paper tape  . Lisinopril Cough    Outpatient Encounter Medications as of 11/22/2017  Medication Sig  . acetaminophen (TYLENOL) 325 MG tablet Take 1 tablet (325 mg total) by mouth every 6 (six) hours as needed for mild pain, moderate pain or headache.  [name] amLODipine (NORVASC) 5 MG  tablet Take 1 tablet (5 mg total) by mouth daily.  [name] amoxicillin-clavulanate (AUGMENTIN) 875-125 MG tablet Take 1 tablet by mouth 2 (two) times daily for 5 days.  . carvedilol (COREG) 25 MG tablet Take 1 tablet (25 mg total) by mouth 2 (two) times daily with a meal.  . Continuous Blood Gluc Receiver (FREESTYLE LIBRE READER) DEVI 1 each by Does not apply route 4 (four) times daily.  . Continuous Blood Gluc Sensor (FREESTYLE LIBRE SENSOR SYSTEM) MISC 1 sensor every 10 days  . HYDROcodone-acetaminophen (NORCO) 5-325 MG tablet Take 1 tablet by mouth every 6 (six) hours as needed for moderate  pain or severe pain.  . Insulin Isophane & Regular Human (NOVOLIN 70/30 FLEXPEN RELION) (70-30) 100 UNIT/ML PEN Inject 20 Units into the skin 2 (two) times daily before a meal. PER SLIDING SCALE  . lip balm (BLISTEX) OINT Apply 1 application topically as needed for lip care.  . magic mouthwash w/lidocaine SOLN Take 5 mLs by mouth 3 (three) times daily as needed for mouth pain (spit after rinsing).  . mycophenolate (CELLCEPT) 250 MG capsule Take 500 mg by mouth 2 (two) times daily.   [name] nystatin (MYCOSTATIN/NYSTOP) powder Apply topically 3 (three) times daily.  . predniSONE (DELTASONE) 5 MG tablet Take 5 mg by mouth daily.   [name] senna-docusate (SENOKOT-S) 8.6-50 MG tablet Take 1 tablet by mouth at bedtime.  . sodium bicarbonate 650 MG tablet Take 2 tablets (1,300 mg total) by mouth 2 (two) times daily.  [name] SYNTHROID 137 MCG tablet Take 137 mcg by mouth daily.  . tacrolimus (PROGRAF) 1 MG capsule Take 1 mg by mouth daily.  . vitamin B-12 (CYANOCOBALAMIN) 100 MCG tablet Take 1 tablet (100 mcg total) by mouth daily.  . [DISCONTINUED] tacrolimus (PROGRAF) 1 MG capsule Take 1 capsule (1 mg total) by mouth daily before breakfast. (Patient not taking: Reported on 11/22/2017)   No facility-administered encounter medications on file as of 11/22/2017.     Review of Systems  Respiratory: Positive for cough.   Gastrointestinal:  Positive for diarrhea.  Musculoskeletal: Positive for arthralgias.  Skin: Positive for wound.  All other systems reviewed and are negative.   Vitals:   11/22/17 1152  BP: (!) 120/50  Pulse: 85  Resp: 20  Temp: 98.6 F (37 C)  SpO2: 99%  Weight: 197 lb 1 oz (89.4 kg)  Height: 5\' 7"  (1.702 m)   Body mass index is 30.86 kg/m. Physical Exam  Constitutional: She is oriented to person, place, and time. She appears well-developed and well-nourished.  Sitting up in bed in NAD  HENT:  Mouth/Throat: Oropharynx is clear and moist. No oropharyngeal exudate.  MMM; no oral thrush  Eyes: Pupils are equal, round, and reactive to light. No scleral icterus.  Neck: Neck supple. Carotid bruit is not present. No tracheal deviation present. No thyromegaly present.  Cardiovascular: Normal rate, regular rhythm and intact distal pulses. Exam reveals no gallop and no friction rub.  Murmur (1/6 SEM) heard. B/l +1 pitting LE edema with calf TTP; chronic venous stasis changes  Pulmonary/Chest: Effort normal. No stridor. No respiratory distress. She has wheezes (R>L basilar wheeze with prolonged expirtaory phase). She has no rales.  Abdominal: Soft. Normal appearance and bowel sounds are normal. She exhibits no distension and no mass. There is no hepatomegaly. There is no tenderness. There is no rigidity, no rebound and no guarding. No hernia.  Musculoskeletal: She exhibits edema.  Lymphadenopathy:    She has no cervical adenopathy.  Neurological: She is alert and oriented to person, place, and time.  Skin: Skin is warm and dry. Rash (b/l LE wounds TTP, erythema and swollen) noted.  Psychiatric: She has a normal mood and affect. Her behavior is normal. Judgment and thought content normal.    Labs reviewed: Basic Metabolic Panel: Recent Labs    11/08/17 0534 11/09/17 0530 11/10/17 0528  11/16/17 0327 11/17/17 0749 11/18/17 0541  NA 138  140 137 134*   < > 134* 136 138  K 4.0  4.0 3.7 4.8   < >  4.6 4.2 4.7  CL 101  103 102 101   < >  104 105 106  CO2 27  28 28 22    < > 24 24 26   GLUCOSE 271*  276* 73 239*   < > 202* 92 130*  BUN 50*  52* 45* 48*   < > 29* 26* 22*  CREATININE 2.15*  2.14* 1.93* 2.10*   < > 1.85* 1.66* 1.68*  CALCIUM 8.6*  8.7* 8.7* 8.5*   < > 8.1* 8.3* 8.6*  MG 1.9 1.8 1.9  --   --   --   --   PHOS 2.7  2.7 2.1* 3.1  --   --   --   --    < > = values in this interval not displayed.   Liver Function Tests: Recent Labs    11/10/17 0528 11/14/17 1313 11/17/17 0749  AST 11* 16 14*  ALT 9* 11* 11*  ALKPHOS 98 113 87  BILITOT 0.9 0.6 0.4  PROT 5.2* 6.1* 5.5*  ALBUMIN 1.6* 1.8* 1.6*   No results for input(s): LIPASE, AMYLASE in the last 8760 hours. No results for input(s): AMMONIA in the last 8760 hours. CBC: Recent Labs    11/12/17 0507 11/14/17 1313 11/15/17 0904 11/16/17 0327 11/17/17 0749  WBC 9.7 10.0 12.2* 6.5 6.1  NEUTROABS 8.1* 9.0* 10.7*  --   --   HGB 9.2* 10.1* 9.8* 8.9* 8.8*  HCT 29.5* 32.6* 31.7* 28.5* 28.8*  MCV 91.9 91.3 89.8 90.2 91.1  PLT 325 321 275 188 162   Cardiac Enzymes: Recent Labs    03/01/17 0804 04/25/17 1755  TROPONINI <0.03 <0.03   BNP: Invalid input(s): POCBNP Lab Results  Component Value Date   HGBA1C 8.9 (H) 11/11/2017   Lab Results  Component Value Date   TSH 8.857 (H) 11/05/2017   Lab Results  Component Value Date   VITAMINB12 133 (L) 01/27/2017   Lab Results  Component Value Date   FOLATE 25.6 01/27/2017   Lab Results  Component Value Date   IRON 5 (L) 11/06/2017   TIBC 122 (L) 11/06/2017   FERRITIN 333 (H) 11/06/2017    Imaging and Procedures obtained prior to SNF admission: Ct Chest Wo Contrast  Result Date: 11/14/2017 CLINICAL DATA:  Short of breath for several days.  No chest pain. EXAM: CT CHEST WITHOUT CONTRAST TECHNIQUE: Multidetector CT imaging of the chest was performed following the standard protocol without IV contrast. COMPARISON:  Current chest radiographs. FINDINGS:  Cardiovascular: Heart is normal in size and configuration. No pericardial effusion. There are three-vessel coronary artery calcifications. Great vessels are normal in caliber. Mild aortic atherosclerotic calcifications. Mediastinum/Nodes: Hypoattenuating nodule from the inferior aspect of the thyroid measuring 2.5 cm. No neck base or axillary masses or pathologically enlarged lymph nodes. Several prominent mediastinal lymph nodes, largest a left paratracheal node adjacent to the AP window measuring 14 mm in short axis. No mediastinal or hilar masses. No discrete enlarged or prominent hilar lymph nodes. Trachea is normal in caliber and widely patent. Esophagus is unremarkable. Lungs/Pleura: Moderate left and small right pleural effusions. There are bilateral areas of patchy ground-glass and more confluent lung consolidation, most evident in the left upper lobe. Additional opacity is noted in the left lower lobe which is likely a combination of infection and atelectasis. There is a discrete pleural based nodule in the right lower lobe, image 127, series 7, measuring 9 mm. No pneumothorax. Upper Abdomen: Area of hypoattenuation noted in the liver adjacent to the falciform ligament consistent with focal fatty infiltration. There is marked atrophy of the  visualize kidneys. Diffuse vascular calcifications are noted. No acute findings. Musculoskeletal: No fracture or acute finding. No osteoblastic or osteolytic lesions. There is surrounding subcutaneous soft tissue edema. IMPRESSION: 1. Findings are consistent with multifocal pneumonia, most evident in the left upper lobe. There are associated moderate left and small right pleural effusions. 2. Prominent to borderline enlarged mediastinal lymph nodes are presumed reactive given the changes in the lungs. 3. Coronary artery calcifications and mild aortic atherosclerosis. Aortic Atherosclerosis (ICD10-I70.0). Electronically Signed   By: Amie Portland M.D.   On: 11/14/2017  16:06   Dg Chest Port 1 View  Result Date: 11/15/2017 CLINICAL DATA:  Status post left thoracentesis. EXAM: PORTABLE CHEST 1 VIEW COMPARISON:  Radiograph of November 14, 2017. FINDINGS: The heart size and mediastinal contours are within normal limits. No pneumothorax is noted. Right lung is clear. Left pleural effusion noted on prior exam is nearly resolved. Mild ill-defined left midlung opacity is noted suggesting inflammation or atelectasis. The visualized skeletal structures are unremarkable. IMPRESSION: No pneumothorax status post thoracentesis. Left pleural effusion is nearly resolved. Electronically Signed   By: Lupita Raider, M.D.   On: 11/15/2017 12:32   Dg Chest Port 1 View  Result Date: 11/14/2017 CLINICAL DATA:  Shortness of breath EXAM: PORTABLE CHEST 1 VIEW COMPARISON:  11/06/2017 FINDINGS: Extensive airspace disease on the left with some volume loss. Normal heart size when accounting for technique. The right lung is relatively clear. No definite effusion. No pneumothorax. IMPRESSION: Extensive left-sided airspace disease consistent with pneumonia in the clinical appropriate setting. Electronically Signed   By: Marnee Spring M.D.   On: 11/14/2017 13:33   US Thoracentesis Asp Pleural Space W/img Guide  Result Date: 11/15/2017 INDICATION: Patient with history of chronic kidney disease, pneumonia, dyspnea, cough, left pleural effusion. Request made for diagnostic and therapeutic left thoracentesis. EXAM: ULTRASOUND GUIDED DIAGNOSTIC AND THERAPEUTIC LEFT THORACENTESIS MEDICATIONS: 1% lidocaine local COMPLICATIONS: None immediate. PROCEDURE: An ultrasound guided thoracentesis was thoroughly discussed with the patient and questions answered. The benefits, risks, alternatives and complications were also discussed. The patient understands and wishes to proceed with the procedure. Written consent was obtained. Ultrasound was performed to localize and mark an adequate pocket of fluid in the left  chest. The area was then prepped and draped in the normal sterile fashion. 1% Lidocaine was used for local anesthesia. Under ultrasound guidance a 6 Fr Safe-T-Centesis catheter was introduced. Thoracentesis was performed. The catheter was removed and a dressing applied. FINDINGS: A total of approximately 1 liter of clear, light yellow fluid was removed. Samples were sent to the laboratory as requested by the clinical team. IMPRESSION: Successful ultrasound guided diagnostic and therapeutic left thoracentesis yielding 1 liter of pleural fluid. Read by: Jeananne Rama, PA-C Electronically Signed   By: Judie Petit.  Shick M.D.   On: 11/15/2017 12:19    Assessment/Plan   ICD-10-CM   1. Cellulitis of lower extremity, unspecified laterality L03.119    b/l  2. HCAP (healthcare-associated pneumonia) J18.9   3. Insulin dependent diabetes mellitus (HCC) E11.9    Z79.4   4. Hypothyroidism due to acquired atrophy of thyroid E03.4   5. Chronic diastolic congestive heart failure (HCC) I50.32   6. Essential hypertension I10     Complete augmentin tx (STOP DATE 11/24/17)  Cont other meds as ordered  PT/OT/ST as ordered  Wound care as ordered  F/u with specialists as scheduled  GOAL: short term rehab and d/c home when medically appropriate. Communicated with pt and nursing.  Will  follow   Labs/tests ordered: CXR 2 view; cbc and bmp in 1 week    Paige Monarrez S. Ancil Linsey  [facility] [address] [place], [place] [address] [phone] Cell (Monday-Friday 8 AM - 5 PM) [phone] After 5 PM and follow prompts

## 2017-11-23 ENCOUNTER — Encounter: Payer: Self-pay | Admitting: Adult Health

## 2017-11-23 ENCOUNTER — Non-Acute Institutional Stay (SKILLED_NURSING_FACILITY): Payer: Medicare Other | Admitting: Adult Health

## 2017-11-23 ENCOUNTER — Ambulatory Visit (HOSPITAL_BASED_OUTPATIENT_CLINIC_OR_DEPARTMENT_OTHER): Payer: Medicare Other

## 2017-11-23 ENCOUNTER — Other Ambulatory Visit: Payer: Self-pay

## 2017-11-23 DIAGNOSIS — J189 Pneumonia, unspecified organism: Secondary | ICD-10-CM

## 2017-11-23 DIAGNOSIS — L97509 Non-pressure chronic ulcer of other part of unspecified foot with unspecified severity: Secondary | ICD-10-CM | POA: Diagnosis not present

## 2017-11-23 DIAGNOSIS — IMO0002 Reserved for concepts with insufficient information to code with codable children: Secondary | ICD-10-CM

## 2017-11-23 DIAGNOSIS — N183 Chronic kidney disease, stage 3 unspecified: Secondary | ICD-10-CM

## 2017-11-23 DIAGNOSIS — E1065 Type 1 diabetes mellitus with hyperglycemia: Secondary | ICD-10-CM

## 2017-11-23 DIAGNOSIS — E10621 Type 1 diabetes mellitus with foot ulcer: Secondary | ICD-10-CM

## 2017-11-23 MED ORDER — GUAIFENESIN-CODEINE 100-10 MG/5ML PO SOLN
10.0000 mL | Freq: Three times a day (TID) | ORAL | 0 refills | Status: DC | PRN
Start: 2017-11-23 — End: 2017-11-25

## 2017-11-23 NOTE — Telephone Encounter (Signed)
RX faxed to [facility] @ [phone], phone number [phone] 

## 2017-11-23 NOTE — Progress Notes (Signed)
Location:   [facility] Room Number: 129 A Place of Service:  SNF (31)   CODE STATUS: Full Code  Allergies  Allergen Reactions  . Latex Itching  . Ace Inhibitors Cough  . Adhesive [Tape] Itching and Other (See Comments)    Please use paper tape  . Lisinopril Cough    Chief Complaint  Patient presents with  . Acute Visit    48 hour Care Plan Meeting    HPI:  We have come together for her care plan meeting. Her goal is to return home as soon as she able. She continues to have a cough present. She is complaining of mouth sores. She does have a walker. She will need a hospital bed a wheelchair and 3:1 commode. She denies changes in appetite; but would like softer foods. There are no nursing concerns at this time.   Past Medical History:  Diagnosis Date  . Arthritis    "elbows, knees, legs, back" (09/24/2015)  . CKD (chronic kidney disease)   . Daily headache   . Depression    "years ago"  . End stage renal disease (HCC)    right arm AV graft, resolved post transplant  . Hypertension   . Hypothyroid   . 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 Kidney disease 2011  . Pneumonia ~ 2007?  [name] Type II diabetes mellitus (HCC)    Insulin dependant    Past Surgical History:  Procedure Laterality Date  . AMPUTATION Left 05/11/2014   Procedure: AMPUTATION LEFT GREAT TOE;  Surgeon: Kathryne Hitch, MD;  Location: [facility];  Service: Orthopedics;  Laterality: Left;  . AV FISTULA PLACEMENT Right    forearm  . BACK SURGERY    . CATARACT EXTRACTION W/ INTRAOCULAR LENS  IMPLANT, BILATERAL Bilateral   . CESAREAN SECTION  07/1999  . DG AV DIALYSIS GRAFT DECLOT OR    . HIP PINNING,CANNULATED Right 06/02/2017   Procedure: CANNULATED RIGHT HIP PINNING;  Surgeon: Beverely Low, MD;  Location: [facility];  Service: Orthopedics;  Laterality: Right;  . INCISION AND DRAINAGE Right 06/02/2015   Procedure: INCISION AND DRAINAGE OF RIGHT 3rd RAY RESECTION;   Surgeon: Kathryne Hitch, MD;  Location: [facility] OR;  Service: Orthopedics;  Laterality: Right;  . 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 LUMBAR DISC SURGERY  2001  . NEPHRECTOMY TRANSPLANTED ORGAN    . TOE AMPUTATION Right    1,2 & 3rd toes.  . TUBAL LIGATION  07/1999    Social History   Socioeconomic History  . Marital status: Married    Spouse name: Not on file  . Number of children: Not on file  . Years of education: Not on file  . Highest education level: Not on file  Social Needs  . Financial resource strain: Not on file  . Food insecurity - worry: Not on file  . Food insecurity - inability: Not on file  . Transportation needs - medical: Not on file  . Transportation needs - non-medical: Not on file  Occupational History  . Occupation: disabled  Tobacco Use  . Smoking status: Former Smoker    Packs/day: 1.00    Years: 11.00    Pack years: 11.00    Types: Cigarettes    Last attempt to quit: 09/21/1998    Years since quitting: 19.1  . Smokeless tobacco: Never Used  Substance and Sexual Activity  . Alcohol use: No  . Drug use: No  .  Sexual activity: No  Other Topics Concern  . Not on file  Social History Narrative  . Not on file   Family History  Problem Relation Age of Onset  . Emphysema Mother   . Throat cancer Mother   . COPD Mother   . Cancer Mother   . Emphysema Father   . COPD Father   . Stroke Father   . ADD / ADHD Son       VITAL SIGNS BP (!) 178/72   Pulse 76   Temp 98 F (36.7 C)   Resp 18   Ht 5\' 7"  (1.702 m)   Wt 186 lb 11.2 oz (84.7 kg)   LMP 11/19/2011   SpO2 98%   BMI 29.24 kg/m   Outpatient Encounter Medications as of 11/23/2017  Medication Sig  . acetaminophen (TYLENOL) 325 MG tablet Take 1 tablet (325 mg total) by mouth every 6 (six) hours as needed for mild pain, moderate pain or headache.  [name]. amLODipine (NORVASC) 5 MG tablet Take 1 tablet (5 mg total) by mouth daily.  [name]. amoxicillin-clavulanate (AUGMENTIN) 875-125 MG  tablet Take 1 tablet by mouth 2 (two) times daily for 5 days.  . carvedilol (COREG) 25 MG tablet Take 1 tablet (25 mg total) by mouth 2 (two) times daily with a meal.  . Continuous Blood Gluc Receiver (FREESTYLE LIBRE READER) DEVI 1 each by Does not apply route 4 (four) times daily.  . Continuous Blood Gluc Sensor (FREESTYLE LIBRE SENSOR SYSTEM) MISC 1 sensor every 10 days  . HYDROcodone-acetaminophen (NORCO) 5-325 MG tablet Take 1 tablet by mouth every 6 (six) hours as needed for moderate pain or severe pain.  . Insulin Isophane & Regular Human (NOVOLIN 70/30 FLEXPEN RELION) (70-30) 100 UNIT/ML PEN Inject 20 Units into the skin 2 (two) times daily before a meal. PER SLIDING SCALE  . lip balm (BLISTEX) OINT Apply 1 application topically as needed for lip care.  . magic mouthwash w/lidocaine SOLN Take 5 mLs by mouth 3 (three) times daily as needed for mouth pain (spit after rinsing).  . mycophenolate (CELLCEPT) 250 MG capsule Take 500 mg by mouth 2 (two) times daily.   [name]. nystatin (MYCOSTATIN/NYSTOP) powder Apply topically 3 (three) times daily.  . predniSONE (DELTASONE) 5 MG tablet Take 5 mg by mouth daily.   [name]. senna-docusate (SENOKOT-S) 8.6-50 MG tablet Take 1 tablet by mouth at bedtime.  . sodium bicarbonate 650 MG tablet Take 2 tablets (1,300 mg total) by mouth 2 (two) times daily.  [name]. SYNTHROID 137 MCG tablet Take 137 mcg by mouth daily.  . tacrolimus (PROGRAF) 1 MG capsule Take 1 mg by mouth daily.  . vitamin B-12 (CYANOCOBALAMIN) 100 MCG tablet Take 1 tablet (100 mcg total) by mouth daily.   No facility-administered encounter medications on file as of 11/23/2017.      SIGNIFICANT DIAGNOSTIC EXAMS  TODAY:  11-14-17: chest x-ray: Extensive left-sided airspace disease consistent with pneumonia in the clinical appropriate setting.  11-14-17: ct of chest: 1. Findings are consistent with multifocal pneumonia, most evident in the left upper lobe. There are associated moderate left and small  right pleural effusions. 2. Prominent to borderline enlarged mediastinal lymph nodes are presumed reactive given the changes in the lungs. 3. Coronary artery calcifications and mild aortic atherosclerosis. Aortic Atherosclerosis  11-15-17: left thoracentesis: 1 liter return  11-15-17: chest x-ray: No pneumothorax status post thoracentesis. Left pleural effusion is nearly resolved.  11-15-17: 2-d echo: - Left ventricle: The cavity size was normal.  Wall thickness was increased in a pattern of moderate LVH. Systolic function was normal. The estimated ejection fraction was in the range of 60% to 65%. Wall motion was normal; there were no regional wall motion abnormalities. Doppler parameters are consistent with pseudonormal ventricular relaxation (grade 2 diastolic dysfunction). The E/A ratio is >1.5 and the E/e&' ratio is >15, suggesting elevated LV filling pressure. - Aortic valve: Trileaflet. Sclerosis without stenosis. There was trivial regurgitation. - Mitral valve: Calcified annulus. Mildly thickened leaflets . There was mild regurgitation. Valve area by continuity equation (using LVOT flow): 1.36 cm^2. - Left atrium: Moderately dilated. - Right atrium: The atrium was mildly dilated. - Inferior vena cava: The vessel was dilated. The respirophasic   diameter changes were blunted (< 50%), consistent with elevated central venous pressure.  LABS REVIEWED: TODAY;   11-05-17: chol 114; ldl 49; trig 187; hdl 28; tsh 8.857; free T4: 1.17;  11-06-17: iron 5; TIBC 122 11-11-17: hgb a1c 8.9 11-14-17: wbc 10.0; hgb 10.1; hct 32.6; mcv 91.3; plt 321; glucose 290; bun 43; creat 2.29; k+ 5.0; na++ 136; ca 8.6; liver normal albumin 1.8; blood and urine culture: no growth 11-17-17: wbc 6.1; hgb 8.8; hct 28.8; mcv 91.1; plt 162; glucose 92; bun 26; creat 1.66; k+ 4.6; na++ 132; ca 8.3; liver normal albumin 1.6      Review of Systems  Reason unable to perform ROS: poor historian   Constitutional: Negative for  malaise/fatigue.  Respiratory: Negative for cough and shortness of breath.   Cardiovascular: Negative for chest pain, palpitations and leg swelling.  Gastrointestinal: Negative for abdominal pain and constipation.  Musculoskeletal: Negative for joint pain and myalgias.  Skin: Negative.   Neurological: Negative for dizziness.  Psychiatric/Behavioral: The patient is not nervous/anxious.      Physical Exam  Constitutional: She is oriented to person, place, and time. She appears well-developed and well-nourished. No distress.  HENT:  Has oral sores present   Neck: No thyromegaly present.  Cardiovascular: Normal rate, regular rhythm and intact distal pulses.  Murmur heard. 1/6 Pedal pulses faint   Pulmonary/Chest: Effort normal and breath sounds normal. No respiratory distress.  Abdominal: Soft. Bowel sounds are normal. She exhibits no distension.  Musculoskeletal: Normal range of motion. She exhibits edema.  Able to move all extremities Has trace bilateral lower extremity edema   Lymphadenopathy:    She has no cervical adenopathy.  Neurological: She is alert and oriented to person, place, and time.  Skin: Skin is warm and dry. She is not diaphoretic.  Bilateral lower extremities discolored Right shin hematoma Left calf red hot inflamed with light pink area present      ASSESSMENT/ PLAN:  TODAY:   1. HCAP 2. Uncontrolled type 1 diabetes mellitus with diabetic foot ulcer 3. Chronic kidney disease stage 3; GFR 30-59.   Will use I/S four times daily PT to evaluate and treat for possible chest percussion Will extend augmentin for 5 more days Probiotic twice daily for 60 days Will check stool for c-diff Will use lidoderm 4% to right hip on in the AM and off in the PM Will use robitussin with codeine 10 cc every 8 hours as needed for cough  Time spent with patient 45 minutes: discussed medical status; treatment; future needs; therapy goals and expectations. Verbalized  understanding.    MD is aware of resident's narcotic use and is in agreement with current plan of care. We will attempt to wean resident as apropriate   Synthia Innocent NP [facility]  Adult Medicine  Contact [phone] Monday through Friday 8am- 5pm  After hours call [phone]

## 2017-11-24 ENCOUNTER — Non-Acute Institutional Stay (SKILLED_NURSING_FACILITY): Payer: Medicare Other | Admitting: Adult Health

## 2017-11-24 ENCOUNTER — Encounter: Payer: Self-pay | Admitting: Adult Health

## 2017-11-24 DIAGNOSIS — S72001A Fracture of unspecified part of neck of right femur, initial encounter for closed fracture: Secondary | ICD-10-CM | POA: Diagnosis not present

## 2017-11-24 DIAGNOSIS — J189 Pneumonia, unspecified organism: Secondary | ICD-10-CM | POA: Diagnosis not present

## 2017-11-24 DIAGNOSIS — I509 Heart failure, unspecified: Secondary | ICD-10-CM | POA: Diagnosis not present

## 2017-11-24 NOTE — Progress Notes (Signed)
Location:   [facility] Room Number: 129 A Place of Service:  SNF (31)   CODE STATUS: Full Code  Allergies  Allergen Reactions  . Latex Itching  . Ace Inhibitors Cough  . Adhesive [Tape] Itching and Other (See Comments)    Please use paper tape  . Lisinopril Cough    Chief Complaint  Patient presents with  . Discharge Note    Discharging to home AMA on 11/25/17    HPI:  She is wanting to go home. He tells me that her spouse and family can provide for her needs. We have discussed that she is not ready; that therapy states she requires more therapy in order to be ready to return back home. She does not agree with this perception. I did tell her that this was against medical advice. We did discuss what dme she will need. She will need a semi-electric bed; wheelchair and 3:1 commode. She will need home health for pt/ot/rn/cna/sw. She will need her prescriptions written and will need to follow up with her medical provider upon her discharge.    Past Medical History:  Diagnosis Date  . Arthritis    "elbows, knees, legs, back" (09/24/2015)  . CKD (chronic kidney disease)   . Daily headache   . Depression    "years ago"  . End stage renal disease (HCC)    right arm AV graft, resolved post transplant  . Hypertension   . Hypothyroid   . 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 Kidney disease 2011  . Pneumonia ~ 2007?  [name] Type II diabetes mellitus (HCC)    Insulin dependant    Past Surgical History:  Procedure Laterality Date  . AMPUTATION Left 05/11/2014   Procedure: AMPUTATION LEFT GREAT TOE;  Surgeon: Kathryne Hitch, MD;  Location: [facility];  Service: Orthopedics;  Laterality: Left;  . AV FISTULA PLACEMENT Right    forearm  . BACK SURGERY    . CATARACT EXTRACTION W/ INTRAOCULAR LENS  IMPLANT, BILATERAL Bilateral   . CESAREAN SECTION  07/1999  . DG AV DIALYSIS GRAFT DECLOT OR    . HIP PINNING,CANNULATED Right 06/02/2017   Procedure: CANNULATED RIGHT HIP PINNING;  Surgeon: Beverely Low, MD;  Location: [facility];  Service: Orthopedics;  Laterality: Right;  . INCISION AND DRAINAGE Right 06/02/2015   Procedure: INCISION AND DRAINAGE OF RIGHT 3rd RAY RESECTION;  Surgeon: Kathryne Hitch, MD;  Location: [facility] OR;  Service: Orthopedics;  Laterality: Right;  . 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 LUMBAR DISC SURGERY  2001  . NEPHRECTOMY TRANSPLANTED ORGAN    . TOE AMPUTATION Right    1,2 & 3rd toes.  . TUBAL LIGATION  07/1999    Social History   Socioeconomic History  . Marital status: Married    Spouse name: Not on file  . Number of children: Not on file  . Years of education: Not on file  . Highest education level: Not on file  Social Needs  . Financial resource strain: Not on file  . Food insecurity - worry: Not on file  . Food insecurity - inability: Not on file  . Transportation needs - medical: Not on file  . Transportation needs - non-medical: Not on file  Occupational History  . Occupation: disabled  Tobacco Use  . Smoking status: Former Smoker    Packs/day: 1.00    Years: 11.00    Pack years: 11.00    Types: Cigarettes  Last attempt to quit: 09/21/1998    Years since quitting: 19.1  . Smokeless tobacco: Never Used  Substance and Sexual Activity  . Alcohol use: No  . Drug use: No  . Sexual activity: No  Other Topics Concern  . Not on file  Social History Narrative  . Not on file   Family History  Problem Relation Age of Onset  . Emphysema Mother   . Throat cancer Mother   . COPD Mother   . Cancer Mother   . Emphysema Father   . COPD Father   . Stroke Father   . ADD / ADHD Son       VITAL SIGNS BP 138/78   Pulse 80   Temp 98 F (36.7 C)   Resp 18   Ht 5\' 7"  (1.702 m)   Wt 186 lb 11.2 oz (84.7 kg)   LMP 11/19/2011   SpO2 99%   BMI 29.24 kg/m   Outpatient Encounter Medications as of 11/24/2017  Medication Sig  . acetaminophen (TYLENOL) 325 MG tablet Take 1  tablet (325 mg total) by mouth every 6 (six) hours as needed for mild pain, moderate pain or headache.  [name] amLODipine (NORVASC) 5 MG tablet Take 1 tablet (5 mg total) by mouth daily.  [name] amoxicillin-clavulanate (AUGMENTIN) 875-125 MG tablet Take 1 tablet by mouth 2 (two) times daily for 5 days.  . carvedilol (COREG) 25 MG tablet Take 1 tablet (25 mg total) by mouth 2 (two) times daily with a meal.  . Continuous Blood Gluc Receiver (FREESTYLE LIBRE READER) DEVI 1 each by Does not apply route 4 (four) times daily.  . Continuous Blood Gluc Sensor (FREESTYLE LIBRE SENSOR SYSTEM) MISC 1 sensor every 10 days  . guaiFENesin-codeine 100-10 MG/5ML syrup Take 10 mLs by mouth every 8 (eight) hours as needed for cough.  [name] HYDROcodone-acetaminophen (NORCO) 5-325 MG tablet Take 1 tablet by mouth every 6 (six) hours as needed for moderate pain or severe pain.  . Insulin Isophane & Regular Human (NOVOLIN 70/30 FLEXPEN RELION) (70-30) 100 UNIT/ML PEN Inject 20 Units into the skin 2 (two) times daily before a meal. PER SLIDING SCALE  . Lactobacillus (ACIDOPHILUS PO) Take 1 capsule by mouth 2 (two) times daily.  . Lidocaine 4 % PTCH Apply topically. Apply to right hip topically one time daily and remove per schedule  . lip balm (BLISTEX) OINT Apply 1 application topically as needed for lip care.  . magic mouthwash w/lidocaine SOLN Take 5 mLs by mouth 3 (three) times daily as needed for mouth pain (spit after rinsing).  . mycophenolate (CELLCEPT) 250 MG capsule Take 500 mg by mouth 2 (two) times daily.   [name] nystatin (MYCOSTATIN/NYSTOP) powder Apply topically 3 (three) times daily.  . predniSONE (DELTASONE) 5 MG tablet Take 5 mg by mouth daily.   [name] senna-docusate (SENOKOT-S) 8.6-50 MG tablet Take 1 tablet by mouth at bedtime.  . sodium bicarbonate 650 MG tablet Take 2 tablets (1,300 mg total) by mouth 2 (two) times daily.  [name] SYNTHROID 137 MCG tablet Take 137 mcg by mouth daily.  . tacrolimus (PROGRAF) 1 MG capsule Take 1  mg by mouth daily.  . vitamin B-12 (CYANOCOBALAMIN) 100 MCG tablet Take 1 tablet (100 mcg total) by mouth daily.   No facility-administered encounter medications on file as of 11/24/2017.      SIGNIFICANT DIAGNOSTIC EXAMS  PREVIOUS:  11-14-17: chest x-ray: Extensive left-sided airspace disease consistent with pneumonia in the clinical appropriate setting.  11-14-17: ct  of chest: 1. Findings are consistent with multifocal pneumonia, most evident in the left upper lobe. There are associated moderate left and small right pleural effusions. 2. Prominent to borderline enlarged mediastinal lymph nodes are presumed reactive given the changes in the lungs. 3. Coronary artery calcifications and mild aortic atherosclerosis. Aortic Atherosclerosis  11-15-17: left thoracentesis: 1 liter return  11-15-17: chest x-ray: No pneumothorax status post thoracentesis. Left pleural effusion is nearly resolved.  11-15-17: 2-d echo: - Left ventricle: The cavity size was normal. Wall thickness was increased in a pattern of moderate LVH. Systolic function was normal. The estimated ejection fraction was in the range of 60% to 65%. Wall motion was normal; there were no regional wall motion abnormalities. Doppler parameters are consistent with pseudonormal ventricular relaxation (grade 2 diastolic dysfunction). The E/A ratio is >1.5 and the E/e&' ratio is >15, suggesting elevated LV filling pressure. - Aortic valve: Trileaflet. Sclerosis without stenosis. There was trivial regurgitation. - Mitral valve: Calcified annulus. Mildly thickened leaflets . There was mild regurgitation. Valve area by continuity equation (using LVOT flow): 1.36 cm^2. - Left atrium: Moderately dilated. - Right atrium: The atrium was mildly dilated. - Inferior vena cava: The vessel was dilated. The respirophasic   diameter changes were blunted (< 50%), consistent with elevated central venous pressure.  NO NEW EXAMS    LABS REVIEWED: PREVIOUS;    11-05-17: chol 114; ldl 49; trig 187; hdl 28; tsh 8.857; free T4: 1.17;  11-06-17: iron 5; TIBC 122 11-11-17: hgb a1c 8.9 11-14-17: wbc 10.0; hgb 10.1; hct 32.6; mcv 91.3; plt 321; glucose 290; bun 43; creat 2.29; k+ 5.0; na++ 136; ca 8.6; liver normal albumin 1.8; blood and urine culture: no growth 11-17-17: wbc 6.1; hgb 8.8; hct 28.8; mcv 91.1; plt 162; glucose 92; bun 26; creat 1.66; k+ 4.6; na++ 132; ca 8.3; liver normal albumin 1.6  NO NEW LABS       Review of Systems  Constitutional: Negative for malaise/fatigue.  Respiratory: Negative for cough and shortness of breath.   Cardiovascular: Negative for chest pain, palpitations and leg swelling.  Gastrointestinal: Negative for constipation and heartburn.  Musculoskeletal: Positive for joint pain. Negative for back pain and myalgias.       Right hip pain   Skin: Negative.   Neurological: Negative for dizziness.  Psychiatric/Behavioral: The patient is not nervous/anxious.      Physical Exam  Constitutional: She is oriented to person, place, and time. She appears well-developed and well-nourished. No distress.  Neck: No thyromegaly present.  Cardiovascular: Normal rate, regular rhythm and intact distal pulses.  Murmur heard. 1/6 Pedal pulses faint   Pulmonary/Chest: Effort normal and breath sounds normal. No respiratory distress.  Abdominal: Soft. Bowel sounds are normal. She exhibits no distension. There is no tenderness.  Musculoskeletal: She exhibits edema.  Able to move all extremities Has trace bilateral lower extremity edema   Status post left great toe amputation   Lymphadenopathy:    She has no cervical adenopathy.  Neurological: She is alert and oriented to person, place, and time.  Skin: Skin is warm and dry. She is not diaphoretic.  Bilateral lower extremities discolored Right shin hematoma Left calf red hot inflamed with light pink area present    Psychiatric: She has a normal mood and affect.     ASSESSMENT/  PLAN:  She will discharged with the following home health services: pt/ot/rn/cna/sw: to evaluate and treat as indicated for gait balance strength adl training medication management adl care and community  resources  DME needed: semi-electric bed due to her right pain which requires her to move/change positions frequently to help relieve pain which cannot bed achieved in a regular bed.  Standard wheelchair with elevated leg rests; cushion; antitippers; brake extensions; in order for her to maintain her current level of independence with her adls which cannot be achieved with a walker; can self propel 3:1 commode  Facility is to make follow up appointment for 1-2 weeks post discharge for follow up  Her prescriptions have been written for a 30 day supply of her medications per the medication list as above No narcotics have been written   Time spent with patient: 45 minutes: discussed AMA discharge; home needs with home health; dme and follow up appointments. Has verbalized understanding.    MD is aware of resident's narcotic use and is in agreement with current plan of care. We will attempt to wean resident as apropriate   Synthia Innocenteborah Larsen Dungan NP [facility]  Contact [phone] Monday through Friday 8am- 5pm  After hours call [phone]

## 2017-11-25 ENCOUNTER — Other Ambulatory Visit: Payer: Self-pay

## 2017-11-25 MED ORDER — GUAIFENESIN-CODEINE 100-10 MG/5ML PO SOLN: 10.0000 mL | Freq: Three times a day (TID) | ORAL | 0 refills | Status: DC | PRN

## 2017-11-25 MED ORDER — HYDROCODONE-ACETAMINOPHEN 5-325 MG PO TABS: 1.0000 | ORAL_TABLET | Freq: Four times a day (QID) | ORAL | 0 refills | Status: DC | PRN

## 2017-11-25 NOTE — Telephone Encounter (Signed)
Discharged with patient 

## 2017-12-15 ENCOUNTER — Emergency Department (HOSPITAL_COMMUNITY): Payer: Medicare Other

## 2017-12-15 ENCOUNTER — Other Ambulatory Visit: Payer: Self-pay

## 2017-12-15 ENCOUNTER — Emergency Department (HOSPITAL_COMMUNITY)
Admission: EM | Admit: 2017-12-15 | Discharge: 2017-12-15 | Disposition: A | Discharge status: Home / Self Care | Payer: Medicare Other | Attending: Emergency Medicine | Admitting: Emergency Medicine

## 2017-12-15 DIAGNOSIS — Z91040 Latex allergy status: Secondary | ICD-10-CM | POA: Diagnosis not present

## 2017-12-15 DIAGNOSIS — I129 Hypertensive chronic kidney disease with stage 1 through stage 4 chronic kidney disease, or unspecified chronic kidney disease: Secondary | ICD-10-CM | POA: Insufficient documentation

## 2017-12-15 DIAGNOSIS — E1122 Type 2 diabetes mellitus with diabetic chronic kidney disease: Secondary | ICD-10-CM | POA: Diagnosis not present

## 2017-12-15 DIAGNOSIS — N183 Chronic kidney disease, stage 3 (moderate): Secondary | ICD-10-CM | POA: Insufficient documentation

## 2017-12-15 DIAGNOSIS — Z79899 Other long term (current) drug therapy: Secondary | ICD-10-CM | POA: Insufficient documentation

## 2017-12-15 DIAGNOSIS — L03119 Cellulitis of unspecified part of limb: Secondary | ICD-10-CM | POA: Diagnosis not present

## 2017-12-15 DIAGNOSIS — E039 Hypothyroidism, unspecified: Secondary | ICD-10-CM | POA: Diagnosis not present

## 2017-12-15 DIAGNOSIS — R2243 Localized swelling, mass and lump, lower limb, bilateral: Secondary | ICD-10-CM | POA: Diagnosis present

## 2017-12-15 DIAGNOSIS — Z87891 Personal history of nicotine dependence: Secondary | ICD-10-CM | POA: Insufficient documentation

## 2017-12-15 LAB — COMPREHENSIVE METABOLIC PANEL
ALBUMIN: 2.1 g/dL — AB (ref 3.5–5.0)
ALK PHOS: 108 U/L (ref 38–126)
ALT: 12 U/L — ABNORMAL LOW (ref 14–54)
AST: 27 U/L (ref 15–41)
Anion gap: 9 (ref 5–15)
BUN: 23 mg/dL — AB (ref 6–20)
CO2: 19 mmol/L — ABNORMAL LOW (ref 22–32)
Calcium: 8.5 mg/dL — ABNORMAL LOW (ref 8.9–10.3)
Chloride: 109 mmol/L (ref 101–111)
Creatinine, Ser: 1.72 mg/dL — ABNORMAL HIGH (ref 0.44–1.00)
GFR calc Af Amer: 39 mL/min — ABNORMAL LOW (ref >60)
GFR, EST NON AFRICAN AMERICAN: 34 mL/min — AB (ref >60)
GLUCOSE: 168 mg/dL — AB (ref 65–99)
POTASSIUM: 3.9 mmol/L (ref 3.5–5.1)
Sodium: 137 mmol/L (ref 135–145)
TOTAL PROTEIN: 6.9 g/dL (ref 6.5–8.1)
Total Bilirubin: 0.7 mg/dL (ref 0.3–1.2)

## 2017-12-15 LAB — CBC WITH DIFFERENTIAL/PLATELET
BASOS ABS: 0 10*3/uL (ref 0.0–0.1)
BASOS PCT: 0 %
EOS ABS: 0.1 10*3/uL (ref 0.0–0.7)
EOS PCT: 2 %
HCT: 29.6 % — ABNORMAL LOW (ref 36.0–46.0)
Hemoglobin: 9.1 g/dL — ABNORMAL LOW (ref 12.0–15.0)
LYMPHS PCT: 17 %
Lymphs Abs: 1.3 10*3/uL (ref 0.7–4.0)
MCH: 27.4 pg (ref 26.0–34.0)
MCHC: 30.7 g/dL (ref 30.0–36.0)
MCV: 89.2 fL (ref 78.0–100.0)
MONO ABS: 0.6 10*3/uL (ref 0.1–1.0)
Monocytes Relative: 8 %
Neutro Abs: 5.5 10*3/uL (ref 1.7–7.7)
Neutrophils Relative %: 73 %
PLATELETS: 275 10*3/uL (ref 150–400)
RBC: 3.32 MIL/uL — AB (ref 3.87–5.11)
RDW: 15.9 % — AB (ref 11.5–15.5)
WBC: 7.5 10*3/uL (ref 4.0–10.5)

## 2017-12-15 MED ORDER — DOXYCYCLINE HYCLATE 100 MG PO TABS
100.0000 mg | ORAL_TABLET | Freq: Once | ORAL | Status: AC
  Administered 2017-12-15: 100 mg via ORAL
  Filled 2017-12-15: qty 1

## 2017-12-15 MED ORDER — HYDROCODONE-ACETAMINOPHEN 5-325 MG PO TABS: 1.0000 | ORAL_TABLET | ORAL | 0 refills | Status: DC | PRN

## 2017-12-15 MED ORDER — DOXYCYCLINE HYCLATE 100 MG PO CAPS: 100.0000 mg | ORAL_CAPSULE | Freq: Two times a day (BID) | ORAL | 0 refills | Status: DC

## 2017-12-15 NOTE — Discharge Instructions (Addendum)
Take Doxycycline twice a day for the next week Follow up with your doctor for a recheck Please elevate your legs and get some compression stockings to reduce the swelling in your legs Take pain medicine as needed for severe pain Return if worsening

## 2017-12-15 NOTE — ED Triage Notes (Signed)
Pt resides at home. Recent DX pneumonia. Currently on antibiotics. [facility] for PT and wound care. Pt here for evaluation of  BIL LE weeping wounds. Chronic swelling to lower ext however increased recently. Diarrhea without vomiting no fever. Cough present non productive. Noncompliant with CPAP. PCP notified by home health RN and referred here to ED for further evaluation. Pt denies chest pain and shortness of breath. 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

## 2017-12-15 NOTE — ED Notes (Signed)
[facility] at bedside 

## 2017-12-15 NOTE — ED Notes (Signed)
[facility] called for transport.  

## 2017-12-15 NOTE — ED Notes (Signed)
[facility] Provider at bedside. 

## 2017-12-15 NOTE — ED Notes (Signed)
Bed: [id number] Expected date:  Expected time:  Means of arrival:  Comments: Weak/l.e. edema

## 2017-12-15 NOTE — ED Provider Notes (Signed)
[facility]-EMERGENCY DEPT Provider Note   CSN: [id number] Arrival date & time: 12/15/17  1421     History   Chief Complaint Chief Complaint  Patient presents with  . Wound Infection  . Leg Swelling  . Cough    Recent pneumonia    HPI [name] is a 52 y.o. female who presents with possible wound infection. PMH significant for CKD, chronic headaches, HTN, s/p renal transplant on immunosuppresion, insulin dependent DM. She was discharged to SNF after her last hospitalization however left before she was ready because she didn't like the care she was receiving.  She states that over the past several days she has had a gradually worsening swelling and pain in her bilateral legs.  She reports associated redness and drainage from a chronic wound on her right shin.  She finished a course of antibiotics for pneumonia 2 days ago.  She denies fever but reports chills.  She reports ongoing shortness of breath and cough.  She denies chest pain, abdominal pain, nausea, vomiting.  She was admitted to the hospital on 2/28 for cellulitis of the legs.    HPI  Past Medical History:  Diagnosis Date  . Arthritis    "elbows, knees, legs, back" (09/24/2015)  . CKD (chronic kidney disease)   . Daily headache   . Depression    "years ago"  . End stage renal disease (HCC)    right arm AV graft, resolved post transplant  . Hypertension   . Hypothyroid   . 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 Kidney disease 2011  . Pneumonia ~ 2007?  [name] Type II diabetes mellitus (HCC)    Insulin dependant    Patient Active Problem List   Diagnosis Date Noted  . Acute on chronic congestive heart failure (HCC)   . HCAP (healthcare-associated pneumonia) 11/14/2017  . Localized swelling of lower extremity 11/05/2017  . Malnutrition of moderate degree 09/02/2017  . Pneumonia   . Status post kidney transplant 06/02/2017  . Closed displaced fracture of right femoral  neck (HCC) 06/02/2017  . Femoral neck fracture (HCC) 06/02/2017  . Hyperglycemia 05/09/2017  . Lower extremity cellulitis 03/23/2017  . Type II diabetes mellitus (HCC) 03/23/2017  . Anemia of chronic disease 01/26/2017  . Abnormal LFTs 01/26/2017  . DKA (diabetic ketoacidoses) (HCC) 08/12/2016  . Hypoglycemia   . Wound cellulitis   . CKD (chronic kidney disease) stage 3, GFR 30-59 ml/min (HCC) 06/26/2016  . Cellulitis of right lower extremity   . QT prolongation 05/13/2016  . UTI (lower urinary tract infection)   . Hypomagnesemia   . Pressure ulcer 04/11/2016  . Acute encephalopathy 04/11/2016  . Altered mental status 04/10/2016  . Insulin dependent diabetes mellitus (HCC) 04/10/2016  . Diabetic ketoacidosis without coma associated with type 2 diabetes mellitus (HCC) 11/02/2015  . Uncontrolled type 1 diabetes mellitus with foot ulcer (HCC) 06/04/2015  . Foot ulcer, right (HCC)   . Diarrhea 06/02/2015  . Type 1 diabetes mellitus with diabetic foot ulcer (HCC) 06/02/2015  . Acute renal failure superimposed on stage 3 chronic kidney disease (HCC) 06/02/2015  . Diabetic foot infection (HCC) 06/01/2015  . Essential hypertension 06/01/2015  . GERD (gastroesophageal reflux disease) 06/01/2015  . AKI (acute kidney injury) (HCC) 01/02/2015  . H/O kidney transplant 08/04/2012  . Hyponatremia 09/22/2011  . Immunosuppression (HCC)   . Hypothyroidism     Past Surgical History:  Procedure Laterality Date  . AMPUTATION Left 05/11/2014  Procedure: AMPUTATION LEFT GREAT TOE;  Surgeon: Kathryne Hitchhristopher Y Blackman, MD;  Location: [facility];  Service: Orthopedics;  Laterality: Left;  . AV FISTULA PLACEMENT Right    forearm  . BACK SURGERY    . CATARACT EXTRACTION W/ INTRAOCULAR LENS  IMPLANT, BILATERAL Bilateral   . CESAREAN SECTION  07/1999  . DG AV DIALYSIS GRAFT DECLOT OR    . HIP PINNING,CANNULATED Right 06/02/2017   Procedure: CANNULATED RIGHT HIP PINNING;  Surgeon: Beverely LowNorris, Steve, MD;  Location:  [facility];  Service: Orthopedics;  Laterality: Right;  . INCISION AND DRAINAGE Right 06/02/2015   Procedure: INCISION AND DRAINAGE OF RIGHT 3rd RAY RESECTION;  Surgeon: Kathryne Hitchhristopher Y Blackman, MD;  Location: [facility] OR;  Service: Orthopedics;  Laterality: Right;  . 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 LUMBAR DISC SURGERY  2001  . NEPHRECTOMY TRANSPLANTED ORGAN    . TOE AMPUTATION Right    1,2 & 3rd toes.  . TUBAL LIGATION  07/1999     OB History    Gravida      Para      Term      Preterm      AB      Living  1     SAB      TAB      Ectopic      Multiple      Live Births               Home Medications    Prior to Admission medications   Medication Sig Start Date End Date Taking? Authorizing Provider  acetaminophen (TYLENOL) 325 MG tablet Take 1 tablet (325 mg total) by mouth every 6 (six) hours as needed for mild pain, moderate pain or headache. 11/12/17   Albertine GratesXu, Fang, MD  amLODipine (NORVASC) 5 MG tablet Take 1 tablet (5 mg total) by mouth daily. 11/20/17   Burnadette PopAdhikari, Amrit, MD  carvedilol (COREG) 25 MG tablet Take 1 tablet (25 mg total) by mouth 2 (two) times daily with a meal. 11/12/17   Albertine GratesXu, Fang, MD  Continuous Blood Gluc Receiver (FREESTYLE LIBRE READER) DEVI 1 each by Does not apply route 4 (four) times daily. 04/29/17   Leatha GildingGherghe, Costin M, MD  Continuous Blood Gluc Sensor (FREESTYLE LIBRE SENSOR SYSTEM) MISC 1 sensor every 10 days 04/29/17   Leatha GildingGherghe, Costin M, MD  guaiFENesin-codeine 100-10 MG/5ML syrup Take 10 mLs by mouth every 8 (eight) hours as needed for cough. 11/25/17   Sharee HolsterGreen, Deborah S, NP  HYDROcodone-acetaminophen (NORCO) 5-325 MG tablet Take 1 tablet by mouth every 6 (six) hours as needed for moderate pain or severe pain. 11/25/17   Sharee HolsterGreen, Deborah S, NP  Insulin Isophane & Regular Human (NOVOLIN 70/30 FLEXPEN RELION) (70-30) 100 UNIT/ML PEN Inject 20 Units into the skin 2 (two) times daily before a meal. PER SLIDING SCALE    [provider]  Lactobacillus  (ACIDOPHILUS PO) Take 1 capsule by mouth 2 (two) times daily. 11/23/17 01/22/18  [provider]  Lidocaine 4 % PTCH Apply topically. Apply to right hip topically one time daily and remove per schedule 11/24/17   [provider]  lip balm (BLISTEX) OINT Apply 1 application topically as needed for lip care. 11/12/17   Albertine GratesXu, Fang, MD  magic mouthwash w/lidocaine SOLN Take 5 mLs by mouth 3 (three) times daily as needed for mouth pain (spit after rinsing). 11/19/17   Burnadette PopAdhikari, Amrit, MD  mycophenolate (CELLCEPT) 250 MG capsule Take 500 mg by mouth 2 (  two) times daily.     [provider]  nystatin (MYCOSTATIN/NYSTOP) powder Apply topically 3 (three) times daily. 11/12/17   Albertine Grates, MD  predniSONE (DELTASONE) 5 MG tablet Take 5 mg by mouth daily.  05/29/15   [provider]  senna-docusate (SENOKOT-S) 8.6-50 MG tablet Take 1 tablet by mouth at bedtime. 11/12/17   Albertine Grates, MD  sodium bicarbonate 650 MG tablet Take 2 tablets (1,300 mg total) by mouth 2 (two) times daily. 09/04/17   Penny Pia, MD  SYNTHROID 137 MCG tablet Take 137 mcg by mouth daily. 04/10/17   [provider]  tacrolimus (PROGRAF) 1 MG capsule Take 1 mg by mouth daily. 11/20/17   [provider]  vitamin B-12 (CYANOCOBALAMIN) 100 MCG tablet Take 1 tablet (100 mcg total) by mouth daily. 03/29/17 03/29/18  Regalado, Prentiss Bells, MD    Family History Family History  Problem Relation Age of Onset  . Emphysema Mother   . Throat cancer Mother   . COPD Mother   . Cancer Mother   . Emphysema Father   . COPD Father   . Stroke Father   . ADD / ADHD Son     Social History Social History   Tobacco Use  . Smoking status: Former Smoker    Packs/day: 1.00    Years: 11.00    Pack years: 11.00    Types: Cigarettes    Last attempt to quit: 09/21/1998    Years since quitting: 19.2  . Smokeless tobacco: Never Used  Substance Use Topics  . Alcohol use: No  . Drug use: No     Allergies   Latex;  Ace inhibitors; Adhesive [tape]; and Lisinopril   Review of Systems Review of Systems  Constitutional: Positive for appetite change, chills and fatigue. Negative for fever.  HENT: Positive for rhinorrhea. Negative for congestion and sore throat.   Respiratory: Positive for cough, shortness of breath and wheezing.   Cardiovascular: Positive for leg swelling. Negative for chest pain.  Gastrointestinal: Negative for abdominal pain, nausea and vomiting.  Genitourinary: Negative for dysuria and frequency.  Skin: Positive for wound.  Allergic/Immunologic: Positive for immunocompromised state.  All other systems reviewed and are negative.    Physical Exam Updated Vital Signs BP (!) 166/70   Pulse 80   Temp 98.8 F (37.1 C) (Oral)   Resp 16   Ht 5\' 7"  (1.702 m)   Wt 84.4 kg (186 lb)   LMP 11/19/2011   SpO2 99%   BMI 29.13 kg/m   Physical Exam  Constitutional: She is oriented to person, place, and time. She appears well-developed and well-nourished. No distress.  Chronically ill-appearing, disheveled  HENT:  Head: Normocephalic and atraumatic.  Eyes: Pupils are equal, round, and reactive to light. Conjunctivae are normal. Right eye exhibits no discharge. Left eye exhibits no discharge. No scleral icterus.  Neck: Normal range of motion.  Cardiovascular: Normal rate and regular rhythm.  Pulmonary/Chest: Effort normal and breath sounds normal. No respiratory distress.  Fistula in the right and left arm with palpable thrills  Abdominal: Soft. Bowel sounds are normal. She exhibits no distension. There is no tenderness.  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  2+ pitting edema of the bilateral lower extremities to the knee  Neurological: She is alert and oriented to person, place, and time.  Skin: Skin is warm and dry.  Psychiatric:  She has a normal mood and affect. Her behavior  is normal.  Nursing note and vitals reviewed.          ED Treatments / Results  Labs (all labs ordered are listed, but only abnormal results are displayed) Labs Reviewed  COMPREHENSIVE METABOLIC PANEL - Abnormal; Notable for the following components:      Result Value   CO2 19 (*)    Glucose, Bld 168 (*)    BUN 23 (*)    Creatinine, Ser 1.72 (*)    Calcium 8.5 (*)    Albumin 2.1 (*)    ALT 12 (*)    GFR calc non Af Amer 34 (*)    GFR calc Af Amer 39 (*)    All other components within normal limits  CBC WITH DIFFERENTIAL/PLATELET - Abnormal; Notable for the following components:   RBC 3.32 (*)    Hemoglobin 9.1 (*)    HCT 29.6 (*)    RDW 15.9 (*)    All other components within normal limits    EKG None  Radiology Dg Chest 2 View  Result Date: 12/15/2017 CLINICAL DATA:  Short of breath, cough EXAM: CHEST - 2 VIEW COMPARISON:  11/15/2017 FINDINGS: The heart size and mediastinal contours are within normal limits. Both lungs are clear. The visualized skeletal structures are unremarkable. IMPRESSION: No active cardiopulmonary disease. Electronically Signed   By: Jasmine Pang M.D.   On: 12/15/2017 17:04    Procedures Procedures (including critical care time)  Medications Ordered in ED Medications  doxycycline (VIBRA-TABS) tablet 100 mg (100 mg Oral Given 12/15/17 1842)     Initial Impression / Assessment and Plan / ED Course  I have reviewed the triage vital signs and the nursing notes.  Pertinent labs & imaging results that were available during my care of the patient were reviewed by me and considered in my medical decision making (see chart for details).  52 year old female with multiple medical problems presents with worsening pain, swelling, redness in the bilateral lower extremities.  She is hypertensive but otherwise vital signs are normal.  Skin changes are possibly related to cellulitis versus chronic stasis changes.  Her CBC shows chronic anemia which is  around her baseline.  She has no leukocytosis.  Her CMP is remarkable for hyperglycemia and elevated serum creatinine which is around her baseline.  Chest x-ray obtained shows improvement from priors.  Due to her immunosuppressed state and frequent admissions will start her on a course of doxycycline and have her follow-up with her doctor in the next week.  She was also given a short course of pain medicine.  PMP database was queried and she does not have any recent prescriptions.  Final Clinical Impressions(s) / ED Diagnoses   Final diagnoses:  Cellulitis of lower extremity, unspecified laterality    ED Discharge Orders    None       Bethel Born, PA-C 12/15/17 1851    Linwood Dibbles, MD 12/15/17 [phone]

## 2017-12-15 NOTE — ED Notes (Signed)
Patient transported to X-ray 

## 2018-01-01 ENCOUNTER — Encounter (HOSPITAL_COMMUNITY): Payer: Self-pay

## 2018-01-01 ENCOUNTER — Emergency Department (HOSPITAL_COMMUNITY)
Admission: EM | Admit: 2018-01-01 | Discharge: 2018-01-01 | Disposition: A | Discharge status: Home / Self Care | Payer: Medicare Other | Source: Home / Self Care | Attending: Emergency Medicine | Admitting: Emergency Medicine

## 2018-01-01 ENCOUNTER — Other Ambulatory Visit: Payer: Self-pay

## 2018-01-01 ENCOUNTER — Emergency Department (HOSPITAL_COMMUNITY): Payer: Medicare Other

## 2018-01-01 DIAGNOSIS — Z79899 Other long term (current) drug therapy: Secondary | ICD-10-CM

## 2018-01-01 DIAGNOSIS — Z940 Kidney transplant status: Secondary | ICD-10-CM | POA: Insufficient documentation

## 2018-01-01 DIAGNOSIS — E039 Hypothyroidism, unspecified: Secondary | ICD-10-CM | POA: Insufficient documentation

## 2018-01-01 DIAGNOSIS — Z91040 Latex allergy status: Secondary | ICD-10-CM

## 2018-01-01 DIAGNOSIS — E1165 Type 2 diabetes mellitus with hyperglycemia: Secondary | ICD-10-CM | POA: Insufficient documentation

## 2018-01-01 DIAGNOSIS — N289 Disorder of kidney and ureter, unspecified: Secondary | ICD-10-CM | POA: Diagnosis not present

## 2018-01-01 DIAGNOSIS — Z794 Long term (current) use of insulin: Secondary | ICD-10-CM | POA: Insufficient documentation

## 2018-01-01 DIAGNOSIS — N186 End stage renal disease: Secondary | ICD-10-CM | POA: Insufficient documentation

## 2018-01-01 DIAGNOSIS — E1122 Type 2 diabetes mellitus with diabetic chronic kidney disease: Secondary | ICD-10-CM | POA: Insufficient documentation

## 2018-01-01 DIAGNOSIS — I132 Hypertensive heart and chronic kidney disease with heart failure and with stage 5 chronic kidney disease, or end stage renal disease: Secondary | ICD-10-CM | POA: Insufficient documentation

## 2018-01-01 DIAGNOSIS — E10621 Type 1 diabetes mellitus with foot ulcer: Secondary | ICD-10-CM | POA: Diagnosis not present

## 2018-01-01 DIAGNOSIS — I5043 Acute on chronic combined systolic (congestive) and diastolic (congestive) heart failure: Secondary | ICD-10-CM | POA: Insufficient documentation

## 2018-01-01 DIAGNOSIS — R739 Hyperglycemia, unspecified: Secondary | ICD-10-CM

## 2018-01-01 DIAGNOSIS — Z87891 Personal history of nicotine dependence: Secondary | ICD-10-CM | POA: Insufficient documentation

## 2018-01-01 LAB — CBC WITH DIFFERENTIAL/PLATELET
BASOS ABS: 0 10*3/uL (ref 0.0–0.1)
BASOS PCT: 0 %
EOS ABS: 0.1 10*3/uL (ref 0.0–0.7)
EOS PCT: 1 %
HEMATOCRIT: 28.5 % — AB (ref 36.0–46.0)
Hemoglobin: 9.1 g/dL — ABNORMAL LOW (ref 12.0–15.0)
Lymphocytes Relative: 14 %
Lymphs Abs: 1 10*3/uL (ref 0.7–4.0)
MCH: 28.3 pg (ref 26.0–34.0)
MCHC: 31.9 g/dL (ref 30.0–36.0)
MCV: 88.8 fL (ref 78.0–100.0)
MONO ABS: 0.5 10*3/uL (ref 0.1–1.0)
MONOS PCT: 7 %
NEUTROS ABS: 5.6 10*3/uL (ref 1.7–7.7)
Neutrophils Relative %: 78 %
PLATELETS: 238 10*3/uL (ref 150–400)
RBC: 3.21 MIL/uL — ABNORMAL LOW (ref 3.87–5.11)
RDW: 15.4 % (ref 11.5–15.5)
WBC: 7.3 10*3/uL (ref 4.0–10.5)

## 2018-01-01 LAB — BASIC METABOLIC PANEL
ANION GAP: 8 (ref 5–15)
BUN: 35 mg/dL — AB (ref 6–20)
CALCIUM: 8.5 mg/dL — AB (ref 8.9–10.3)
CO2: 20 mmol/L — ABNORMAL LOW (ref 22–32)
Chloride: 106 mmol/L (ref 101–111)
Creatinine, Ser: 1.8 mg/dL — ABNORMAL HIGH (ref 0.44–1.00)
GFR calc Af Amer: 37 mL/min — ABNORMAL LOW (ref >60)
GFR, EST NON AFRICAN AMERICAN: 32 mL/min — AB (ref >60)
Glucose, Bld: 388 mg/dL — ABNORMAL HIGH (ref 65–99)
POTASSIUM: 3.9 mmol/L (ref 3.5–5.1)
SODIUM: 134 mmol/L — AB (ref 135–145)

## 2018-01-01 LAB — URINALYSIS, ROUTINE W REFLEX MICROSCOPIC
BACTERIA UA: NONE SEEN
Bilirubin Urine: NEGATIVE
Glucose, UA: >=500 mg/dL — AB
KETONES UR: NEGATIVE mg/dL
NITRITE: NEGATIVE
PH: 6 (ref 5.0–8.0)
Protein, ur: >=300 mg/dL — AB
SPECIFIC GRAVITY, URINE: 1.01 (ref 1.005–1.030)
WBC, UA: >50 WBC/hpf — ABNORMAL HIGH (ref 0–5)

## 2018-01-01 LAB — HEPATIC FUNCTION PANEL
ALK PHOS: 94 U/L (ref 38–126)
ALT: 12 U/L — AB (ref 14–54)
AST: 15 U/L (ref 15–41)
Albumin: 2.3 g/dL — ABNORMAL LOW (ref 3.5–5.0)
BILIRUBIN DIRECT: 0.1 mg/dL (ref 0.1–0.5)
BILIRUBIN TOTAL: 0.6 mg/dL (ref 0.3–1.2)
Indirect Bilirubin: 0.5 mg/dL (ref 0.3–0.9)
Total Protein: 7.1 g/dL (ref 6.5–8.1)

## 2018-01-01 LAB — CBG MONITORING, ED: GLUCOSE-CAPILLARY: 398 mg/dL — AB (ref 65–99)

## 2018-01-01 LAB — I-STAT BETA HCG BLOOD, ED (MC, WL, AP ONLY): I-stat hCG, quantitative: 9 m[IU]/mL — ABNORMAL HIGH (ref <5)

## 2018-01-01 MED ORDER — SODIUM CHLORIDE 0.9 % IV BOLUS
1000.0000 mL | Freq: Once | INTRAVENOUS | Status: AC
  Administered 2018-01-01: 1000 mL via INTRAVENOUS

## 2018-01-01 MED ORDER — HYDROCODONE-ACETAMINOPHEN 5-325 MG PO TABS
1.0000 | ORAL_TABLET | Freq: Once | ORAL | Status: AC
Start: 2018-01-01 — End: 2018-01-01
  Administered 2018-01-01: 1 via ORAL
  Filled 2018-01-01: qty 1

## 2018-01-01 MED ORDER — INSULIN ASPART 100 UNIT/ML ~~LOC~~ SOLN
5.0000 [IU] | Freq: Once | SUBCUTANEOUS | Status: AC
  Administered 2018-01-01: 5 [IU] via SUBCUTANEOUS
  Filled 2018-01-01: qty 1

## 2018-01-01 MED ORDER — SODIUM CHLORIDE 0.9 % IV SOLN
1.0000 g | Freq: Once | INTRAVENOUS | Status: AC
  Administered 2018-01-01: 1 g via INTRAVENOUS
  Filled 2018-01-01: qty 10

## 2018-01-01 MED ORDER — HYDROCODONE-ACETAMINOPHEN 5-325 MG PO TABS: 1.0000 | ORAL_TABLET | Freq: Four times a day (QID) | ORAL | 0 refills | Status: DC | PRN

## 2018-01-01 MED ORDER — SULFAMETHOXAZOLE-TRIMETHOPRIM 800-160 MG PO TABS: 1.0000 | ORAL_TABLET | Freq: Two times a day (BID) | ORAL | 0 refills | Status: DC

## 2018-01-01 NOTE — Discharge Instructions (Addendum)
Follow-up with a family doctor or follow-up at the urgent care at [facility] for recheck in a week

## 2018-01-01 NOTE — ED Notes (Signed)
[facility] called for transport.  

## 2018-01-01 NOTE — ED Notes (Signed)
Pt was able to ambulate to the restroom with a walker.  

## 2018-01-01 NOTE — ED Notes (Signed)
PTAR at bedside to pick up patient.   

## 2018-01-01 NOTE — ED Notes (Signed)
Patient transported to X-ray 

## 2018-01-01 NOTE — ED Provider Notes (Signed)
[facility]-EMERGENCY DEPT Provider Note   CSN: [id number] Arrival date & time: 01/01/18  1804     History   Chief Complaint Chief Complaint  Patient presents with  . Hyperglycemia    HPI [name] is a 51 y.o. female.  Patient complains of her sugar thing elevated she is got a small ulcer to her right lower leg.  The history is provided by the patient. No language interpreter was used.  Illness  This is a recurrent problem. The current episode started 12 to 24 hours ago. The problem occurs constantly. The problem has not changed since onset.Pertinent negatives include no chest pain, no abdominal pain and no headaches. Nothing aggravates the symptoms. Nothing relieves the symptoms. She has tried nothing for the symptoms. The treatment provided no relief.    Past Medical History:  Diagnosis Date  . Arthritis    "elbows, knees, legs, back" (09/24/2015)  . CKD (chronic kidney disease)   . Daily headache   . Depression    "years ago"  . End stage renal disease (HCC)    right arm AV graft, resolved post transplant  . Hypertension   . Hypothyroid   . 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 Kidney disease 2011  . Pneumonia ~ 2007?  [name] Type II diabetes mellitus (HCC)    Insulin dependant    Patient Active Problem List   Diagnosis Date Noted  . Acute on chronic congestive heart failure (HCC)   . HCAP (healthcare-associated pneumonia) 11/14/2017  . Localized swelling of lower extremity 11/05/2017  . Malnutrition of moderate degree 09/02/2017  . Pneumonia   . Status post kidney transplant 06/02/2017  . Closed displaced fracture of right femoral neck (HCC) 06/02/2017  . Femoral neck fracture (HCC) 06/02/2017  . Hyperglycemia 05/09/2017  . Lower extremity cellulitis 03/23/2017  . Type II diabetes mellitus (HCC) 03/23/2017  . Anemia of chronic disease 01/26/2017  . Abnormal LFTs 01/26/2017  . DKA (diabetic ketoacidoses) (HCC)  08/12/2016  . Hypoglycemia   . Wound cellulitis   . CKD (chronic kidney disease) stage 3, GFR 30-59 ml/min (HCC) 06/26/2016  . Cellulitis of right lower extremity   . QT prolongation 05/13/2016  . UTI (lower urinary tract infection)   . Hypomagnesemia   . Pressure ulcer 04/11/2016  . Acute encephalopathy 04/11/2016  . Altered mental status 04/10/2016  . Insulin dependent diabetes mellitus (HCC) 04/10/2016  . Diabetic ketoacidosis without coma associated with type 2 diabetes mellitus (HCC) 11/02/2015  . Uncontrolled type 1 diabetes mellitus with foot ulcer (HCC) 06/04/2015  . Foot ulcer, right (HCC)   . Diarrhea 06/02/2015  . Type 1 diabetes mellitus with diabetic foot ulcer (HCC) 06/02/2015  . Acute renal failure superimposed on stage 3 chronic kidney disease (HCC) 06/02/2015  . Diabetic foot infection (HCC) 06/01/2015  . Essential hypertension 06/01/2015  . GERD (gastroesophageal reflux disease) 06/01/2015  . AKI (acute kidney injury) (HCC) 01/02/2015  . H/O kidney transplant 08/04/2012  . Hyponatremia 09/22/2011  . Immunosuppression (HCC)   . Hypothyroidism     Past Surgical History:  Procedure Laterality Date  . AMPUTATION Left 05/11/2014   Procedure: AMPUTATION LEFT GREAT TOE;  Surgeon: Kathryne Hitch, MD;  Location: [facility];  Service: Orthopedics;  Laterality: Left;  . AV FISTULA PLACEMENT Right    forearm  . BACK SURGERY    . CATARACT EXTRACTION W/ INTRAOCULAR LENS  IMPLANT, BILATERAL Bilateral   . CESAREAN SECTION  07/1999  .  DG AV DIALYSIS GRAFT DECLOT OR    . HIP PINNING,CANNULATED Right 06/02/2017   Procedure: CANNULATED RIGHT HIP PINNING;  Surgeon: Beverely Low, MD;  Location: [facility];  Service: Orthopedics;  Laterality: Right;  . INCISION AND DRAINAGE Right 06/02/2015   Procedure: INCISION AND DRAINAGE OF RIGHT 3rd RAY RESECTION;  Surgeon: Kathryne Hitch, MD;  Location: [facility] OR;  Service: Orthopedics;  Laterality: Right;  . 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 LUMBAR DISC SURGERY  2001  . NEPHRECTOMY TRANSPLANTED ORGAN    . TOE AMPUTATION Right    1,2 & 3rd toes.  . TUBAL LIGATION  07/1999     OB History    Gravida      Para      Term      Preterm      AB      Living  1     SAB      TAB      Ectopic      Multiple      Live Births               Home Medications    Prior to Admission medications   Medication Sig Start Date End Date Taking? Authorizing Provider  acetaminophen (TYLENOL) 325 MG tablet Take 1 tablet (325 mg total) by mouth every 6 (six) hours as needed for mild pain, moderate pain or headache. 11/12/17  Yes Albertine Grates, MD  amLODipine (NORVASC) 5 MG tablet Take 1 tablet (5 mg total) by mouth daily. 11/20/17  Yes Burnadette Pop, MD  carvedilol (COREG) 25 MG tablet Take 1 tablet (25 mg total) by mouth 2 (two) times daily with a meal. 11/12/17  Yes Albertine Grates, MD  furosemide (LASIX) 40 MG tablet Take 40 mg by mouth daily. 12/09/17  Yes [provider]  guaiFENesin-codeine 100-10 MG/5ML syrup Take 10 mLs by mouth every 8 (eight) hours as needed for cough. 11/25/17  Yes Sharee Holster, NP  Insulin Isophane & Regular Human (NOVOLIN 70/30 FLEXPEN RELION) (70-30) 100 UNIT/ML PEN Inject 20 Units into the skin 2 (two) times daily before a meal. PER SLIDING SCALE   Yes [provider]  lip balm (BLISTEX) OINT Apply 1 application topically as needed for lip care. 11/12/17  Yes Albertine Grates, MD  magic mouthwash w/lidocaine SOLN Take 5 mLs by mouth 3 (three) times daily as needed for mouth pain (spit after rinsing). 11/19/17  Yes Burnadette Pop, MD  mycophenolate (CELLCEPT) 250 MG capsule Take 500 mg by mouth 2 (two) times daily.    Yes [provider]  predniSONE (DELTASONE) 5 MG tablet Take 5 mg by mouth daily.  05/29/15  Yes [provider]  senna-docusate (SENOKOT-S) 8.6-50 MG tablet Take 1 tablet by mouth at bedtime. 11/12/17  Yes Albertine Grates, MD  sodium bicarbonate 650 MG tablet Take 2  tablets (1,300 mg total) by mouth 2 (two) times daily. 09/04/17  Yes Penny Pia, MD  SYNTHROID 137 MCG tablet Take 137 mcg by mouth daily. 04/10/17  Yes [provider]  tacrolimus (PROGRAF) 0.5 MG capsule Take 0.5 mg by mouth at bedtime. 06/17/16  Yes [provider]  tacrolimus (PROGRAF) 1 MG capsule Take 1 mg by mouth daily. 11/20/17  Yes [provider]  vitamin B-12 (CYANOCOBALAMIN) 100 MCG tablet Take 1 tablet (100 mcg total) by mouth daily. 03/29/17 03/29/18 Yes Regalado, Prentiss Bells, MD  Continuous Blood Gluc Receiver (FREESTYLE LIBRE READER) DEVI 1 each by Does  not apply route 4 (four) times daily. 04/29/17   Leatha Gilding, MD  Continuous Blood Gluc Sensor (FREESTYLE LIBRE SENSOR SYSTEM) MISC 1 sensor every 10 days 04/29/17   Leatha Gilding, MD  doxycycline (VIBRAMYCIN) 100 MG capsule Take 1 capsule (100 mg total) by mouth 2 (two) times daily. Patient not taking: Reported on 01/01/2018 12/15/17   Bethel Born, PA-C  HYDROcodone-acetaminophen (NORCO/VICODIN) 5-325 MG tablet Take 1 tablet by mouth every 6 (six) hours as needed for moderate pain. 01/01/18   Bethann Berkshire, MD  nystatin (MYCOSTATIN/NYSTOP) powder Apply topically 3 (three) times daily. Patient not taking: Reported on 01/01/2018 11/12/17   Albertine Grates, MD  sulfamethoxazole-trimethoprim (BACTRIM DS,SEPTRA DS) 800-160 MG tablet Take 1 tablet by mouth 2 (two) times daily for 7 days. 01/01/18 01/08/18  Bethann Berkshire, MD    Family History Family History  Problem Relation Age of Onset  . Emphysema Mother   . Throat cancer Mother   . COPD Mother   . Cancer Mother   . Emphysema Father   . COPD Father   . Stroke Father   . ADD / ADHD Son     Social History Social History   Tobacco Use  . Smoking status: Former Smoker    Packs/day: 1.00    Years: 11.00    Pack years: 11.00    Types: Cigarettes    Last attempt to quit: 09/21/1998    Years since quitting: 19.2  . Smokeless tobacco: Never Used    Substance Use Topics  . Alcohol use: No  . Drug use: No     Allergies   Latex; Ace inhibitors; Adhesive [tape]; and Lisinopril   Review of Systems Review of Systems  Constitutional: Negative for appetite change and fatigue.  HENT: Negative for congestion, ear discharge and sinus pressure.   Eyes: Negative for discharge.  Respiratory: Negative for cough.   Cardiovascular: Negative for chest pain.  Gastrointestinal: Negative for abdominal pain and diarrhea.  Genitourinary: Negative for frequency and hematuria.  Musculoskeletal: Negative for back pain.  Skin: Negative for rash.       Ulcer right lower leg  Neurological: Negative for seizures and headaches.  Psychiatric/Behavioral: Negative for hallucinations.     Physical Exam Updated Vital Signs BP (!) 181/81 (BP Location: Left Arm)   Pulse 83   Temp 98.9 F (37.2 C) (Oral)   Resp 13   Ht  (1.702 m)   Wt 83.9 kg (185 lb)   LMP 11/19/2011 Comment: pt had tubal ligation  SpO2 97%   BMI 28.98 kg/m   Physical Exam  Constitutional: She is oriented to person, place, and time. She appears well-developed.  HENT:  Head: Normocephalic.  Eyes: Conjunctivae and EOM are normal. No scleral icterus.  Neck: Neck supple. No thyromegaly present.  Cardiovascular: Normal rate and regular rhythm. Exam reveals no gallop and no friction rub.  No murmur heard. Pulmonary/Chest: No stridor. She has no wheezes. She has no rales. She exhibits no tenderness.  Abdominal: She exhibits no distension. There is no tenderness. There is no rebound.  Musculoskeletal: She exhibits no edema.  Ulcer to right lower leg  Lymphadenopathy:    She has no cervical adenopathy.  Neurological: She is oriented to person, place, and time. She exhibits normal muscle tone. Coordination normal.  Skin: No rash noted. No erythema.  Psychiatric: She has a normal mood and affect. Her behavior is normal.     ED Treatments / Results  Labs (all labs ordered  are listed, but only abnormal results are displayed) Labs Reviewed  BASIC METABOLIC PANEL - Abnormal; Notable for the following components:      Result Value   Sodium 134 (*)    CO2 20 (*)    Glucose, Bld 388 (*)    BUN 35 (*)    Creatinine, Ser 1.80 (*)    Calcium 8.5 (*)    GFR calc non Af Amer 32 (*)    GFR calc Af Amer 37 (*)    All other components within normal limits  URINALYSIS, ROUTINE W REFLEX MICROSCOPIC - Abnormal; Notable for the following components:   Color, Urine STRAW (*)    APPearance HAZY (*)    Glucose, UA >=500 (*)    Hgb urine dipstick SMALL (*)    Protein, ur >=300 (*)    Leukocytes, UA MODERATE (*)    WBC, UA >50 (*)    All other components within normal limits  HEPATIC FUNCTION PANEL - Abnormal; Notable for the following components:   Albumin 2.3 (*)    ALT 12 (*)    All other components within normal limits  CBC WITH DIFFERENTIAL/PLATELET - Abnormal; Notable for the following components:   RBC 3.21 (*)    Hemoglobin 9.1 (*)    HCT 28.5 (*)    All other components within normal limits  CBG MONITORING, ED - Abnormal; Notable for the following components:   Glucose-Capillary 398 (*)    All other components within normal limits  I-STAT BETA HCG BLOOD, ED ([facility], [facility], [facility] ONLY) - Abnormal; Notable for the following components:   I-stat hCG, quantitative 9.0 (*)    All other components within normal limits  URINE CULTURE  CBG MONITORING, ED    EKG None  Radiology Dg Tibia/fibula Right  Result Date: 01/01/2018 CLINICAL DATA:  Open wound along the anterior and lateral aspect of the mid right leg. EXAM: RIGHT TIBIA AND FIBULA - 2 VIEW COMPARISON:  None. FINDINGS: Osteopenic appearance of the tibia and fibula. No fracture bone destruction. No focal soft tissue mass or mineralization. Tibial arteriosclerosis is identified. Soft tissue ulceration is noted anteriorly along the mid right leg measuring 2.7 cm and craniocaudad span. The reported wound along the  lateral aspect is not radiographically apparent. IMPRESSION: Osteopenic appearance of the right tibia and fibula without fracture or bone destruction. Soft tissue ulceration anterior mid leg. Electronically Signed   By: Tollie Eth M.D.   On: 01/01/2018 19:25    Procedures Procedures (including critical care time)  Medications Ordered in ED Medications  HYDROcodone-acetaminophen (NORCO/VICODIN) 5-325 MG per tablet 1 tablet (1 tablet Oral Given 01/01/18 2050)  sodium chloride 0.9 % bolus 1,000 mL (0 mLs Intravenous Stopped 01/01/18 2216)  cefTRIAXone (ROCEPHIN) 1 g in sodium chloride 0.9 % 100 mL IVPB (0 g Intravenous Stopped 01/01/18 2216)  insulin aspart (novoLOG) injection 5 Units (5 Units Subcutaneous Given 01/01/18 2103)     Initial Impression / Assessment and Plan / ED Course  I have reviewed the triage vital signs and the nursing notes.  Pertinent labs & imaging results that were available during my care of the patient were reviewed by me and considered in my medical decision making (see chart for details).     Patient with poorly controlled glucose and ulcer to leg.  She will be placed on antibiotics and she was given insulin and fluids to improve her sugar.  She will follow-up with her PCP  Final Clinical Impressions(s) / ED Diagnoses  Final diagnoses:  Hyperglycemia    ED Discharge Orders        Ordered    sulfamethoxazole-trimethoprim (BACTRIM DS,SEPTRA DS) 800-160 MG tablet  2 times daily     01/01/18 2219    HYDROcodone-acetaminophen (NORCO/VICODIN) 5-325 MG tablet  Every 6 hours PRN     01/01/18 2219       Bethann Berkshire, MD 01/01/18 2317

## 2018-01-01 NOTE — ED Notes (Signed)
Patient CBG 304, EDP made aware.

## 2018-01-01 NOTE — ED Notes (Signed)
Bed: JY78 Expected date: 01/01/18 Expected time: 6:03 PM Means of arrival: Ambulance Comments: EMS Hyperglycemia

## 2018-01-01 NOTE — ED Notes (Signed)
Urine culture sent down to the lab with urinalysis.

## 2018-01-01 NOTE — ED Triage Notes (Signed)
Pt comes from home. Pt arrived via [employer]. Pt was told by PCP to go to ER. Pt has trouble controlling blood sugar or pt does not adhere to care plan to control blood sugar. Pt has open right leg infection.   Pt does have AV shunts in both arms.  Pt is AOx4  Pt uses wheel chair to move around / pt can stand in one position with assistance.

## 2018-01-03 LAB — URINE CULTURE

## 2018-01-04 ENCOUNTER — Encounter (HOSPITAL_COMMUNITY): Payer: Self-pay | Admitting: Emergency Medicine

## 2018-01-04 ENCOUNTER — Emergency Department (HOSPITAL_COMMUNITY): Payer: Medicare Other

## 2018-01-04 ENCOUNTER — Inpatient Hospital Stay (HOSPITAL_COMMUNITY)
Admission: EM | Admit: 2018-01-04 | Discharge: 2018-01-11 | DRG: 638 | Disposition: A | Discharge status: Home Health | Payer: Medicare Other | Attending: Internal Medicine | Admitting: Internal Medicine

## 2018-01-04 DIAGNOSIS — L97909 Non-pressure chronic ulcer of unspecified part of unspecified lower leg with unspecified severity: Secondary | ICD-10-CM

## 2018-01-04 DIAGNOSIS — D849 Immunodeficiency, unspecified: Secondary | ICD-10-CM | POA: Diagnosis present

## 2018-01-04 DIAGNOSIS — L0390 Cellulitis, unspecified: Secondary | ICD-10-CM | POA: Diagnosis not present

## 2018-01-04 DIAGNOSIS — Z87891 Personal history of nicotine dependence: Secondary | ICD-10-CM

## 2018-01-04 DIAGNOSIS — Z825 Family history of asthma and other chronic lower respiratory diseases: Secondary | ICD-10-CM | POA: Diagnosis not present

## 2018-01-04 DIAGNOSIS — I83019 Varicose veins of right lower extremity with ulcer of unspecified site: Secondary | ICD-10-CM | POA: Diagnosis not present

## 2018-01-04 DIAGNOSIS — E039 Hypothyroidism, unspecified: Secondary | ICD-10-CM | POA: Diagnosis present

## 2018-01-04 DIAGNOSIS — Z9842 Cataract extraction status, left eye: Secondary | ICD-10-CM | POA: Diagnosis not present

## 2018-01-04 DIAGNOSIS — R197 Diarrhea, unspecified: Secondary | ICD-10-CM | POA: Diagnosis present

## 2018-01-04 DIAGNOSIS — Z89421 Acquired absence of other right toe(s): Secondary | ICD-10-CM

## 2018-01-04 DIAGNOSIS — N170 Acute kidney failure with tubular necrosis: Secondary | ICD-10-CM | POA: Diagnosis present

## 2018-01-04 DIAGNOSIS — E10649 Type 1 diabetes mellitus with hypoglycemia without coma: Secondary | ICD-10-CM | POA: Diagnosis present

## 2018-01-04 DIAGNOSIS — Z888 Allergy status to other drugs, medicaments and biological substances status: Secondary | ICD-10-CM | POA: Diagnosis not present

## 2018-01-04 DIAGNOSIS — L89890 Pressure ulcer of other site, unstageable: Secondary | ICD-10-CM | POA: Diagnosis present

## 2018-01-04 DIAGNOSIS — L97509 Non-pressure chronic ulcer of other part of unspecified foot with unspecified severity: Secondary | ICD-10-CM | POA: Diagnosis present

## 2018-01-04 DIAGNOSIS — L97519 Non-pressure chronic ulcer of other part of right foot with unspecified severity: Secondary | ICD-10-CM | POA: Diagnosis present

## 2018-01-04 DIAGNOSIS — E10621 Type 1 diabetes mellitus with foot ulcer: Principal | ICD-10-CM | POA: Diagnosis present

## 2018-01-04 DIAGNOSIS — Z79890 Hormone replacement therapy (postmenopausal): Secondary | ICD-10-CM

## 2018-01-04 DIAGNOSIS — L97919 Non-pressure chronic ulcer of unspecified part of right lower leg with unspecified severity: Secondary | ICD-10-CM | POA: Diagnosis not present

## 2018-01-04 DIAGNOSIS — Z808 Family history of malignant neoplasm of other organs or systems: Secondary | ICD-10-CM

## 2018-01-04 DIAGNOSIS — Z823 Family history of stroke: Secondary | ICD-10-CM

## 2018-01-04 DIAGNOSIS — E1052 Type 1 diabetes mellitus with diabetic peripheral angiopathy with gangrene: Secondary | ICD-10-CM | POA: Diagnosis present

## 2018-01-04 DIAGNOSIS — L03115 Cellulitis of right lower limb: Secondary | ICD-10-CM | POA: Diagnosis present

## 2018-01-04 DIAGNOSIS — Z79899 Other long term (current) drug therapy: Secondary | ICD-10-CM

## 2018-01-04 DIAGNOSIS — I96 Gangrene, not elsewhere classified: Secondary | ICD-10-CM | POA: Diagnosis not present

## 2018-01-04 DIAGNOSIS — Z961 Presence of intraocular lens: Secondary | ICD-10-CM | POA: Diagnosis present

## 2018-01-04 DIAGNOSIS — E1022 Type 1 diabetes mellitus with diabetic chronic kidney disease: Secondary | ICD-10-CM | POA: Diagnosis present

## 2018-01-04 DIAGNOSIS — N189 Chronic kidney disease, unspecified: Secondary | ICD-10-CM | POA: Diagnosis not present

## 2018-01-04 DIAGNOSIS — I872 Venous insufficiency (chronic) (peripheral): Secondary | ICD-10-CM | POA: Diagnosis present

## 2018-01-04 DIAGNOSIS — Z91048 Other nonmedicinal substance allergy status: Secondary | ICD-10-CM | POA: Diagnosis not present

## 2018-01-04 DIAGNOSIS — Z794 Long term (current) use of insulin: Secondary | ICD-10-CM | POA: Diagnosis not present

## 2018-01-04 DIAGNOSIS — K219 Gastro-esophageal reflux disease without esophagitis: Secondary | ICD-10-CM | POA: Diagnosis present

## 2018-01-04 DIAGNOSIS — Z7951 Long term (current) use of inhaled steroids: Secondary | ICD-10-CM | POA: Diagnosis not present

## 2018-01-04 DIAGNOSIS — Z9841 Cataract extraction status, right eye: Secondary | ICD-10-CM | POA: Diagnosis not present

## 2018-01-04 DIAGNOSIS — Z91040 Latex allergy status: Secondary | ICD-10-CM

## 2018-01-04 DIAGNOSIS — M170 Bilateral primary osteoarthritis of knee: Secondary | ICD-10-CM | POA: Diagnosis present

## 2018-01-04 DIAGNOSIS — I129 Hypertensive chronic kidney disease with stage 1 through stage 4 chronic kidney disease, or unspecified chronic kidney disease: Secondary | ICD-10-CM | POA: Diagnosis present

## 2018-01-04 DIAGNOSIS — L03116 Cellulitis of left lower limb: Secondary | ICD-10-CM | POA: Insufficient documentation

## 2018-01-04 DIAGNOSIS — M479 Spondylosis, unspecified: Secondary | ICD-10-CM | POA: Diagnosis present

## 2018-01-04 DIAGNOSIS — E1065 Type 1 diabetes mellitus with hyperglycemia: Secondary | ICD-10-CM | POA: Diagnosis present

## 2018-01-04 DIAGNOSIS — E11622 Type 2 diabetes mellitus with other skin ulcer: Secondary | ICD-10-CM | POA: Diagnosis not present

## 2018-01-04 DIAGNOSIS — M19021 Primary osteoarthritis, right elbow: Secondary | ICD-10-CM | POA: Diagnosis present

## 2018-01-04 DIAGNOSIS — N184 Chronic kidney disease, stage 4 (severe): Secondary | ICD-10-CM | POA: Diagnosis present

## 2018-01-04 DIAGNOSIS — D631 Anemia in chronic kidney disease: Secondary | ICD-10-CM | POA: Diagnosis present

## 2018-01-04 DIAGNOSIS — M19022 Primary osteoarthritis, left elbow: Secondary | ICD-10-CM | POA: Diagnosis present

## 2018-01-04 DIAGNOSIS — N289 Disorder of kidney and ureter, unspecified: Secondary | ICD-10-CM | POA: Diagnosis present

## 2018-01-04 DIAGNOSIS — Z8673 Personal history of transient ischemic attack (TIA), and cerebral infarction without residual deficits: Secondary | ICD-10-CM

## 2018-01-04 DIAGNOSIS — Z940 Kidney transplant status: Secondary | ICD-10-CM

## 2018-01-04 DIAGNOSIS — D899 Disorder involving the immune mechanism, unspecified: Secondary | ICD-10-CM

## 2018-01-04 DIAGNOSIS — L03119 Cellulitis of unspecified part of limb: Secondary | ICD-10-CM | POA: Diagnosis present

## 2018-01-04 DIAGNOSIS — I10 Essential (primary) hypertension: Secondary | ICD-10-CM | POA: Diagnosis present

## 2018-01-04 DIAGNOSIS — E10622 Type 1 diabetes mellitus with other skin ulcer: Secondary | ICD-10-CM | POA: Diagnosis present

## 2018-01-04 DIAGNOSIS — E1040 Type 1 diabetes mellitus with diabetic neuropathy, unspecified: Secondary | ICD-10-CM | POA: Diagnosis present

## 2018-01-04 LAB — CBC WITH DIFFERENTIAL/PLATELET
BASOS ABS: 0 10*3/uL (ref 0.0–0.1)
Basophils Relative: 1 %
EOS PCT: 1 %
Eosinophils Absolute: 0 10*3/uL (ref 0.0–0.7)
HEMATOCRIT: 27.6 % — AB (ref 36.0–46.0)
Hemoglobin: 8.8 g/dL — ABNORMAL LOW (ref 12.0–15.0)
Lymphocytes Relative: 11 %
Lymphs Abs: 0.7 10*3/uL (ref 0.7–4.0)
MCH: 27.9 pg (ref 26.0–34.0)
MCHC: 31.9 g/dL (ref 30.0–36.0)
MCV: 87.6 fL (ref 78.0–100.0)
MONO ABS: 0.3 10*3/uL (ref 0.1–1.0)
MONOS PCT: 4 %
NEUTROS ABS: 5.3 10*3/uL (ref 1.7–7.7)
Neutrophils Relative %: 83 %
PLATELETS: 239 10*3/uL (ref 150–400)
RBC: 3.15 MIL/uL — ABNORMAL LOW (ref 3.87–5.11)
RDW: 15 % (ref 11.5–15.5)
WBC: 6.4 10*3/uL (ref 4.0–10.5)

## 2018-01-04 LAB — CBC
HCT: 27.6 % — ABNORMAL LOW (ref 36.0–46.0)
HEMOGLOBIN: 8.7 g/dL — AB (ref 12.0–15.0)
MCH: 27.4 pg (ref 26.0–34.0)
MCHC: 31.5 g/dL (ref 30.0–36.0)
MCV: 87.1 fL (ref 78.0–100.0)
Platelets: 273 10*3/uL (ref 150–400)
RBC: 3.17 MIL/uL — AB (ref 3.87–5.11)
RDW: 14.9 % (ref 11.5–15.5)
WBC: 8.1 10*3/uL (ref 4.0–10.5)

## 2018-01-04 LAB — URINALYSIS, COMPLETE (UACMP) WITH MICROSCOPIC
Bilirubin Urine: NEGATIVE
Glucose, UA: >=500 mg/dL — AB
KETONES UR: 20 mg/dL — AB
Nitrite: NEGATIVE
PH: 6 (ref 5.0–8.0)
Protein, ur: 100 mg/dL — AB
RBC / HPF: >50 RBC/hpf — ABNORMAL HIGH (ref 0–5)
SPECIFIC GRAVITY, URINE: 1.015 (ref 1.005–1.030)
WBC, UA: >50 WBC/hpf — ABNORMAL HIGH (ref 0–5)

## 2018-01-04 LAB — COMPREHENSIVE METABOLIC PANEL
ALBUMIN: 2.1 g/dL — AB (ref 3.5–5.0)
ALK PHOS: 99 U/L (ref 38–126)
ALT: 11 U/L — ABNORMAL LOW (ref 14–54)
ANION GAP: 14 (ref 5–15)
AST: 11 U/L — AB (ref 15–41)
BILIRUBIN TOTAL: 1.3 mg/dL — AB (ref 0.3–1.2)
BUN: 36 mg/dL — AB (ref 6–20)
CO2: 16 mmol/L — AB (ref 22–32)
Calcium: 8.7 mg/dL — ABNORMAL LOW (ref 8.9–10.3)
Chloride: 100 mmol/L — ABNORMAL LOW (ref 101–111)
Creatinine, Ser: 2.41 mg/dL — ABNORMAL HIGH (ref 0.44–1.00)
GFR calc Af Amer: 26 mL/min — ABNORMAL LOW (ref >60)
GFR calc non Af Amer: 22 mL/min — ABNORMAL LOW (ref >60)
GLUCOSE: 456 mg/dL — AB (ref 65–99)
POTASSIUM: 3.7 mmol/L (ref 3.5–5.1)
SODIUM: 130 mmol/L — AB (ref 135–145)
TOTAL PROTEIN: 6.8 g/dL (ref 6.5–8.1)

## 2018-01-04 LAB — C-REACTIVE PROTEIN: CRP: 3.9 mg/dL — ABNORMAL HIGH (ref <1.0)

## 2018-01-04 LAB — I-STAT CG4 LACTIC ACID, ED
Lactic Acid, Venous: 1.54 mmol/L (ref 0.5–1.9)
Lactic Acid, Venous: 1.18 mmol/L (ref 0.5–1.9)

## 2018-01-04 LAB — CBG MONITORING, ED
GLUCOSE-CAPILLARY: 316 mg/dL — AB (ref 65–99)
Glucose-Capillary: 531 mg/dL (ref 65–99)

## 2018-01-04 LAB — CREATININE, SERUM
Creatinine, Ser: 2.27 mg/dL — ABNORMAL HIGH (ref 0.44–1.00)
GFR calc Af Amer: 28 mL/min — ABNORMAL LOW (ref >60)
GFR calc non Af Amer: 24 mL/min — ABNORMAL LOW (ref >60)

## 2018-01-04 LAB — GLUCOSE, CAPILLARY: GLUCOSE-CAPILLARY: 200 mg/dL — AB (ref 65–99)

## 2018-01-04 LAB — SEDIMENTATION RATE: Sed Rate: 93 mm/hr — ABNORMAL HIGH (ref 0–22)

## 2018-01-04 MED ORDER — HYDROCODONE-ACETAMINOPHEN 5-325 MG PO TABS
1.0000 | ORAL_TABLET | Freq: Four times a day (QID) | ORAL | Status: DC | PRN
  Administered 2018-01-04 – 2018-01-10 (×19): 1 via ORAL
  Filled 2018-01-04 (×18): qty 1

## 2018-01-04 MED ORDER — TACROLIMUS 1 MG PO CAPS
1.0000 mg | ORAL_CAPSULE | Freq: Every day | ORAL | Status: DC
  Administered 2018-01-05 – 2018-01-11 (×6): 1 mg via ORAL
  Filled 2018-01-04 (×6): qty 1

## 2018-01-04 MED ORDER — PIPERACILLIN-TAZOBACTAM 3.375 G IVPB
3.3750 g | Freq: Three times a day (TID) | INTRAVENOUS | Status: DC
  Administered 2018-01-04 – 2018-01-11 (×20): 3.375 g via INTRAVENOUS
  Filled 2018-01-04 (×24): qty 50

## 2018-01-04 MED ORDER — MYCOPHENOLATE MOFETIL 250 MG PO CAPS
500.0000 mg | ORAL_CAPSULE | Freq: Two times a day (BID) | ORAL | Status: DC
  Administered 2018-01-04 – 2018-01-11 (×14): 500 mg via ORAL
  Filled 2018-01-04 (×15): qty 2

## 2018-01-04 MED ORDER — GUAIFENESIN-CODEINE 100-10 MG/5ML PO SOLN: 10.0000 mL | Freq: Three times a day (TID) | ORAL | Status: DC | PRN

## 2018-01-04 MED ORDER — CARVEDILOL 25 MG PO TABS
25.0000 mg | ORAL_TABLET | Freq: Two times a day (BID) | ORAL | Status: DC
  Administered 2018-01-04 – 2018-01-11 (×13): 25 mg via ORAL
  Filled 2018-01-04 (×3): qty 1
  Filled 2018-01-04: qty 2
  Filled 2018-01-04 (×8): qty 1

## 2018-01-04 MED ORDER — VANCOMYCIN HCL 10 G IV SOLR
1500.0000 mg | Freq: Once | INTRAVENOUS | Status: AC
  Administered 2018-01-04: 1500 mg via INTRAVENOUS
  Filled 2018-01-04: qty 1500

## 2018-01-04 MED ORDER — AMLODIPINE BESYLATE 5 MG PO TABS
5.0000 mg | ORAL_TABLET | Freq: Every day | ORAL | Status: DC
  Administered 2018-01-04: 5 mg via ORAL
  Filled 2018-01-04 (×2): qty 1

## 2018-01-04 MED ORDER — SODIUM CHLORIDE 0.9 % IV SOLN
INTRAVENOUS | Status: DC
  Administered 2018-01-04 – 2018-01-09 (×5): via INTRAVENOUS

## 2018-01-04 MED ORDER — ONDANSETRON HCL 4 MG/2ML IJ SOLN
4.0000 mg | Freq: Four times a day (QID) | INTRAMUSCULAR | Status: DC | PRN
  Administered 2018-01-04 – 2018-01-11 (×4): 4 mg via INTRAVENOUS
  Filled 2018-01-04 (×5): qty 2

## 2018-01-04 MED ORDER — PREDNISONE 5 MG PO TABS
5.0000 mg | ORAL_TABLET | Freq: Every day | ORAL | Status: DC
  Administered 2018-01-05 – 2018-01-11 (×7): 5 mg via ORAL
  Filled 2018-01-04 (×7): qty 1

## 2018-01-04 MED ORDER — VANCOMYCIN HCL IN DEXTROSE 1-5 GM/200ML-% IV SOLN
1000.0000 mg | INTRAVENOUS | Status: DC
  Administered 2018-01-05: 1000 mg via INTRAVENOUS
  Filled 2018-01-04 (×2): qty 200

## 2018-01-04 MED ORDER — NYSTATIN 100000 UNIT/GM EX POWD
Freq: Three times a day (TID) | CUTANEOUS | Status: DC
  Filled 2018-01-04 (×2): qty 15

## 2018-01-04 MED ORDER — LEVOTHYROXINE SODIUM 112 MCG PO TABS
137.0000 ug | ORAL_TABLET | Freq: Every day | ORAL | Status: DC
  Administered 2018-01-05 – 2018-01-11 (×7): 137 ug via ORAL
  Filled 2018-01-04 (×7): qty 1

## 2018-01-04 MED ORDER — SODIUM CHLORIDE 0.9 % IV SOLN
1.0000 g | Freq: Once | INTRAVENOUS | Status: DC
  Filled 2018-01-04: qty 10

## 2018-01-04 MED ORDER — ONDANSETRON HCL 4 MG PO TABS
4.0000 mg | ORAL_TABLET | Freq: Four times a day (QID) | ORAL | Status: DC | PRN
  Administered 2018-01-06 – 2018-01-07 (×2): 4 mg via ORAL
  Filled 2018-01-04 (×3): qty 1

## 2018-01-04 MED ORDER — SENNOSIDES-DOCUSATE SODIUM 8.6-50 MG PO TABS
1.0000 | ORAL_TABLET | Freq: Every day | ORAL | Status: DC
Start: 2018-01-04 — End: 2018-01-11
  Administered 2018-01-08: 1 via ORAL
  Filled 2018-01-04 (×3): qty 1

## 2018-01-04 MED ORDER — TACROLIMUS 0.5 MG PO CAPS
0.5000 mg | ORAL_CAPSULE | Freq: Every day | ORAL | Status: DC
  Administered 2018-01-05 – 2018-01-10 (×6): 0.5 mg via ORAL
  Filled 2018-01-04 (×7): qty 1

## 2018-01-04 MED ORDER — ACETAMINOPHEN 325 MG PO TABS
325.0000 mg | ORAL_TABLET | Freq: Four times a day (QID) | ORAL | Status: DC | PRN
  Administered 2018-01-06: 325 mg via ORAL
  Filled 2018-01-04: qty 1

## 2018-01-04 MED ORDER — VANCOMYCIN HCL IN DEXTROSE 1-5 GM/200ML-% IV SOLN: 1000.0000 mg | Freq: Once | INTRAVENOUS | Status: DC

## 2018-01-04 MED ORDER — HEPARIN SODIUM (PORCINE) 5000 UNIT/ML IJ SOLN
5000.0000 [IU] | Freq: Three times a day (TID) | INTRAMUSCULAR | Status: DC
  Administered 2018-01-04 – 2018-01-11 (×17): 5000 [IU] via SUBCUTANEOUS
  Filled 2018-01-04 (×15): qty 1

## 2018-01-04 MED ORDER — INSULIN ASPART PROT & ASPART (70-30 MIX) 100 UNIT/ML ~~LOC~~ SUSP
20.0000 [IU] | Freq: Two times a day (BID) | SUBCUTANEOUS | Status: DC
  Administered 2018-01-05 (×2): 20 [IU] via SUBCUTANEOUS
  Filled 2018-01-04 (×2): qty 10

## 2018-01-04 MED ORDER — SODIUM BICARBONATE 650 MG PO TABS
1300.0000 mg | ORAL_TABLET | Freq: Two times a day (BID) | ORAL | Status: DC
  Administered 2018-01-04 – 2018-01-11 (×14): 1300 mg via ORAL
  Filled 2018-01-04 (×15): qty 2

## 2018-01-04 MED ORDER — VITAMIN B-12 100 MCG PO TABS
100.0000 ug | ORAL_TABLET | Freq: Every day | ORAL | Status: DC
  Administered 2018-01-05 – 2018-01-11 (×7): 100 ug via ORAL
  Filled 2018-01-04 (×7): qty 1

## 2018-01-04 MED ORDER — SENNOSIDES-DOCUSATE SODIUM 8.6-50 MG PO TABS
1.0000 | ORAL_TABLET | Freq: Every evening | ORAL | Status: DC | PRN
  Filled 2018-01-04: qty 1

## 2018-01-04 MED ORDER — MAGIC MOUTHWASH W/LIDOCAINE
5.0000 mL | Freq: Three times a day (TID) | ORAL | Status: DC | PRN
  Filled 2018-01-04: qty 5

## 2018-01-04 MED ORDER — BLISTEX MEDICATED EX OINT
1.0000 "application " | TOPICAL_OINTMENT | CUTANEOUS | Status: DC | PRN
  Filled 2018-01-04: qty 6.3

## 2018-01-04 MED ORDER — FUROSEMIDE 40 MG PO TABS
40.0000 mg | ORAL_TABLET | Freq: Every day | ORAL | Status: DC
  Administered 2018-01-04 – 2018-01-05 (×2): 40 mg via ORAL
  Filled 2018-01-04: qty 2
  Filled 2018-01-04: qty 1

## 2018-01-04 NOTE — H&P (Signed)
History and Physical    PA TENNANT MNO:[id number] DOB: March 29, 1966 DOA: 01/04/2018  Referring MD/NP/PA: Dr Kathrynn Humble  PCP: Hayden Rasmussen, MD   Outpatient Specialists: None  Patient coming from: Home  Chief Complaint: Right leg wound  HPI: [name] is a 52 y.o. female with medical history significant of chronic kidney disease stage IV with hypertension and previous renal transplant on chronic suppressive therapy who presented to the ER with purulent wound on her right leg as well as generalize swelling, redness and tenderness. Patient was seen in the emergency room on April 10 with cellulitis. At that point she was placed on IV antibiotics. She has had recent healthcare associated pneumonia. Patient has wound care that has been trying to control the wound but apparently has started having purulent discharge today. She was seen by the wound care team yesterday where they recommended debridement of the existing ulcer at that point patient refused. Based on recommendation from home health not however she came in today. Denied any fever or chills. Patient is having diarrhea at the moment has had 3 episodes since arrival in the ER.  ED Course: patient was seen and evaluated. No evidence of sepsis with normal lactic acid and white count. Hemoglobin is 8.7 with a sodium 1:30 and creatinine 2.41. Her glucose however was 456. Patient has foul-smelling discharge from the right leg wound. Given a dose of vancomycin and Rocephin IV in the ER.  Review of Systems: As per HPI otherwise 10 point review of systems negative.    Past Medical History:  Diagnosis Date  . Arthritis    "elbows, knees, legs, back" (09/24/2015)  . CKD (chronic kidney disease)   . Daily headache   . Depression    "years ago"  . End stage renal disease (HCC)    right arm AV graft, resolved post transplant  . Hypertension   . Hypothyroid   . 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 Kidney  disease 2011  . Pneumonia ~ 2007?  [name] Type II diabetes mellitus (HCC)    Insulin dependant    Past Surgical History:  Procedure Laterality Date  . AMPUTATION Left 05/11/2014   Procedure: AMPUTATION LEFT GREAT TOE;  Surgeon: Mcarthur Rossetti, MD;  Location: [facility];  Service: Orthopedics;  Laterality: Left;  . AV FISTULA PLACEMENT Right    forearm  . BACK SURGERY    . CATARACT EXTRACTION W/ INTRAOCULAR LENS  IMPLANT, BILATERAL Bilateral   . CESAREAN SECTION  07/1999  . DG AV DIALYSIS GRAFT DECLOT OR    . HIP PINNING,CANNULATED Right 06/02/2017   Procedure: CANNULATED RIGHT HIP PINNING;  Surgeon: Netta Cedars, MD;  Location: [facility];  Service: Orthopedics;  Laterality: Right;  . INCISION AND DRAINAGE Right 06/02/2015   Procedure: INCISION AND DRAINAGE OF RIGHT 3rd RAY RESECTION;  Surgeon: Mcarthur Rossetti, MD;  Location: [facility];  Service: Orthopedics;  Laterality: Right;  . 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 [place] SURGERY  2001  . NEPHRECTOMY TRANSPLANTED ORGAN    . TOE AMPUTATION Right    1,2 & 3rd toes.  . TUBAL LIGATION  07/1999     reports that she quit smoking about 19 years ago. Her smoking use included cigarettes. She has a 11.00 pack-year smoking history. She has never used smokeless tobacco. She reports that she does not drink alcohol or use drugs.  Allergies  Allergen Reactions  . Latex Itching  . Ace Inhibitors  Cough  . Adhesive [Tape] Itching and Other (See Comments)    Please use paper tape  . Lisinopril Cough    Family History  Problem Relation Age of Onset  . Emphysema Mother   . Throat cancer Mother   . COPD Mother   . Cancer Mother   . Emphysema Father   . COPD Father   . Stroke Father   . ADD / ADHD Son     Prior to Admission medications   Medication Sig Start Date End Date Taking? Authorizing Provider  acetaminophen (TYLENOL) 325 MG tablet Take 1 tablet (325 mg total) by mouth every 6 (six) hours as needed for mild pain, moderate  pain or headache. 11/12/17   Florencia Reasons, MD  amLODipine (NORVASC) 5 MG tablet Take 1 tablet (5 mg total) by mouth daily. 11/20/17   Shelly Coss, MD  carvedilol (COREG) 25 MG tablet Take 1 tablet (25 mg total) by mouth 2 (two) times daily with a meal. 11/12/17   Florencia Reasons, MD  Continuous Blood Gluc Receiver (FREESTYLE LIBRE READER) DEVI 1 each by Does not apply route 4 (four) times daily. 04/29/17   Caren Griffins, MD  Continuous Blood Gluc Sensor (FREESTYLE LIBRE SENSOR SYSTEM) MISC 1 sensor every 10 days 04/29/17   Caren Griffins, MD  doxycycline (VIBRAMYCIN) 100 MG capsule Take 1 capsule (100 mg total) by mouth 2 (two) times daily. Patient not taking: Reported on 01/01/2018 12/15/17   Recardo Evangelist, PA-C  furosemide (LASIX) 40 MG tablet Take 40 mg by mouth daily. 12/09/17   [provider]  guaiFENesin-codeine 100-10 MG/5ML syrup Take 10 mLs by mouth every 8 (eight) hours as needed for cough. 11/25/17   Gerlene Fee, NP  HYDROcodone-acetaminophen (NORCO/VICODIN) 5-325 MG tablet Take 1 tablet by mouth every 6 (six) hours as needed for moderate pain. 01/01/18   Milton Ferguson, MD  Insulin Isophane & Regular Human (NOVOLIN 70/30 FLEXPEN RELION) (70-30) 100 UNIT/ML PEN Inject 20 Units into the skin 2 (two) times daily before a meal. PER SLIDING SCALE    [provider]  lip balm (BLISTEX) OINT Apply 1 application topically as needed for lip care. 11/12/17   Florencia Reasons, MD  magic mouthwash w/lidocaine SOLN Take 5 mLs by mouth 3 (three) times daily as needed for mouth pain (spit after rinsing). 11/19/17   Shelly Coss, MD  mycophenolate (CELLCEPT) 250 MG capsule Take 500 mg by mouth 2 (two) times daily.     [provider]  nystatin (MYCOSTATIN/NYSTOP) powder Apply topically 3 (three) times daily. Patient not taking: Reported on 01/01/2018 11/12/17   Florencia Reasons, MD  predniSONE (DELTASONE) 5 MG tablet Take 5 mg by mouth daily.  05/29/15   [provider]  senna-docusate  (SENOKOT-S) 8.6-50 MG tablet Take 1 tablet by mouth at bedtime. 11/12/17   Florencia Reasons, MD  sodium bicarbonate 650 MG tablet Take 2 tablets (1,300 mg total) by mouth 2 (two) times daily. 09/04/17   Velvet Bathe, MD  sulfamethoxazole-trimethoprim (BACTRIM DS,SEPTRA DS) 800-160 MG tablet Take 1 tablet by mouth 2 (two) times daily for 7 days. 01/01/18 01/08/18  Milton Ferguson, MD  SYNTHROID 137 MCG tablet Take 137 mcg by mouth daily. 04/10/17   [provider]  tacrolimus (PROGRAF) 0.5 MG capsule Take 0.5 mg by mouth at bedtime. 06/17/16   [provider]  tacrolimus (PROGRAF) 1 MG capsule Take 1 mg by mouth daily. 11/20/17   [provider]  vitamin B-12 (CYANOCOBALAMIN) 100  MCG tablet Take 1 tablet (100 mcg total) by mouth daily. 03/29/17 03/29/18  Elmarie Shiley, MD    Physical Exam: Vitals:   01/04/18 1700 01/04/18 1715 01/04/18 1722 01/04/18 1730  BP: (!) 161/89 (!) 166/81 (!) 166/81 (!) 155/60  Pulse: 88 90  84  Resp: (!) '21 18  12  ' Temp:      TempSrc:      SpO2: 94% 97%  95%      Constitutional: NAD, calm, comfortable Vitals:   01/04/18 1700 01/04/18 1715 01/04/18 1722 01/04/18 1730  BP: (!) 161/89 (!) 166/81 (!) 166/81 (!) 155/60  Pulse: 88 90  84  Resp: (!) '21 18  12  ' Temp:      TempSrc:      SpO2: 94% 97%  95%   Eyes: PERRL, lids and conjunctivae normal ENMT: Mucous membranes are moist. Posterior pharynx clear of any exudate or lesions.Normal dentition.  Neck: normal, supple, no masses, no thyromegaly Respiratory: clear to auscultation bilaterally, no wheezing, no crackles. Normal respiratory effort. No accessory muscle use.  Cardiovascular: Regular rate and rhythm, no murmurs / rubs / gallops. No extremity edema. 2+ pedal pulses. No carotid bruits.  Abdomen: no tenderness, no masses palpated. No hepatosplenomegaly. Bowel sounds positive.  Musculoskeletal: no clubbing / cyanosis. No joint deformity upper and lower extremities. Good ROM, no contractures.  Normal muscle tone.  Skin: 2 pressure ulcers in right leg, open, purulent ulcers. No induration Neurologic: CN 2-12 grossly intact. Sensation intact, DTR normal. Strength 5/5 in all 4.  Psychiatric: Normal judgment and insight. Alert and oriented x 3. Normal mood.    Labs on Admission: I have personally reviewed following labs and imaging studies  CBC: Recent Labs  Lab 01/01/18 1859 01/04/18 1341 01/04/18 1724  WBC 7.3 6.4 8.1  NEUTROABS 5.6 5.3  --   HGB 9.1* 8.8* 8.7*  HCT 28.5* 27.6* 27.6*  MCV 88.8 87.6 87.1  PLT 238 239 161   Basic Metabolic Panel: Recent Labs  Lab 01/01/18 1823 01/04/18 1341  NA 134* 130*  K 3.9 3.7  CL 106 100*  CO2 20* 16*  GLUCOSE 388* 456*  BUN 35* 36*  CREATININE 1.80* 2.41*  CALCIUM 8.5* 8.7*   GFR: Estimated Creatinine Clearance: 31.1 mL/min (A) (by C-G formula based on SCr of 2.41 mg/dL (H)). Liver Function Tests: Recent Labs  Lab 01/01/18 1840 01/04/18 1341  AST 15 11*  ALT 12* 11*  ALKPHOS 94 99  BILITOT 0.6 1.3*  PROT 7.1 6.8  ALBUMIN 2.3* 2.1*   No results for input(s): LIPASE, AMYLASE in the last 168 hours. No results for input(s): AMMONIA in the last 168 hours. Coagulation Profile: No results for input(s): INR, PROTIME in the last 168 hours. Cardiac Enzymes: No results for input(s): CKTOTAL, CKMB, CKMBINDEX, TROPONINI in the last 168 hours. BNP (last 3 results) No results for input(s): PROBNP in the last 8760 hours. HbA1C: No results for input(s): HGBA1C in the last 72 hours. CBG: Recent Labs  Lab 01/01/18 1822 01/04/18 1138 01/04/18 1710  GLUCAP 398* 531* 316*   Lipid Profile: No results for input(s): CHOL, HDL, LDLCALC, TRIG, CHOLHDL, LDLDIRECT in the last 72 hours. Thyroid Function Tests: No results for input(s): TSH, T4TOTAL, FREET4, T3FREE, THYROIDAB in the last 72 hours. Anemia Panel: No results for input(s): VITAMINB12, FOLATE, FERRITIN, TIBC, IRON, RETICCTPCT in the last 72 hours. Urine analysis:      Component Value Date/Time   COLORURINE YELLOW 01/04/2018 1341   APPEARANCEUR CLOUDY (A)  01/04/2018 1341   LABSPEC 1.015 01/04/2018 1341   PHURINE 6.0 01/04/2018 1341   GLUCOSEU >=500 (A) 01/04/2018 1341   HGBUR LARGE (A) 01/04/2018 1341   BILIRUBINUR NEGATIVE 01/04/2018 1341   KETONESUR 20 (A) 01/04/2018 1341   PROTEINUR 100 (A) 01/04/2018 1341   UROBILINOGEN 0.2 03/02/2015 1153   NITRITE NEGATIVE 01/04/2018 1341   LEUKOCYTESUR MODERATE (A) 01/04/2018 1341   Sepsis Labs: '@LABRCNTIP' (procalcitonin:4,lacticidven:4) ) Recent Results (from the past 240 hour(s))  Urine culture     Status: Abnormal   Collection Time: 01/01/18  8:52 PM  Result Value Ref Range Status   Specimen Description   Final    URINE, RANDOM Performed at [facility], [place] [address] [place], [place] [address]    Special Requests   Final    NONE Performed at [facility], [place] [address], [place], [place] [address]    Culture (A)  Final    <10,000 COLONIES/mL INSIGNIFICANT GROWTH Performed at [facility] [address], [place], [place] [address]    Report Status 01/03/2018 FINAL  Final     Radiological Exams on Admission: Dg Tibia/fibula Right  Result Date: 01/04/2018 CLINICAL DATA:  Right lower leg swelling for the past 2 months. Clinical concern for gas-forming infection. EXAM: RIGHT TIBIA AND FIBULA - 2 VIEW COMPARISON:  01/01/2018. FINDINGS: Diffuse soft tissue swelling. Diffuse arterial calcifications. No soft tissue gas, bone destruction or periosteal reaction. An anterior mid lower leg soft tissue ulceration is again demonstrated. Diffuse osteopenia. IMPRESSION: 1. Anterior soft tissue ulceration without evidence of underlying osteomyelitis. 2. No soft tissue gas. 3. Extensive atheromatous arterial calcifications. Electronically Signed   By: Claudie Revering M.D.   On: 01/04/2018 14:47    Assessment/Plan Principal Problem:   Wound cellulitis Active  Problems:   Immunosuppression ([place])   Hypothyroidism   Essential hypertension   GERD (gastroesophageal reflux disease)   Type 1 diabetes mellitus with diabetic foot ulcer (HCC)   Lower extremity cellulitis   Status post kidney transplant    #1 right lower extremity cellulitis and wound: patient has a deep wound with pus. Surrounding skin is red, warm to touch. I suspect cellulitis but osteomyelitis need to be ruled out. Patient is immunocompromised due to her kidney transplant and taking steroids and immunosuppressants. We will cover her for the widening is broad-spectrum antibiotics including vancomycin and Zosyn. Obtain wound culture. Check ESR and C-reactive proteins. Consider MRI of the lower extremity to rule out osteomyelitis. Surgical consult can be obtained for possible debridement  #2 uncontrolled diabetes: Continue sliding scale insulin in addition to her home regimen. Blood sugar currently is on controlled.  #3 diarrhea: Patient started having diarrhea in the ER. We will check stool studies including C. Difficile as well as stool cultures. She is currently on vancomycin and Zosyn but has been on antibiotics on and off for the last 4 weeks.  #4 hypothyroidism: Continue levothyroxine.  #5 hypertension: Blood pressure is controlled at this point. Monitor closely.  #6 immunosuppression: Patient is immunocompromised. She is on immunosuppressants for her renal transplant. She is therefore susceptible to infections.  #7 chronic kidney disease stage IV: Patient has had end-stage renal disease with renal transplant. Seems to be at baseline. No need for nephrology consult at this point.   DVT prophylaxis: Heparin  Code Status: Full  Family Communication: None at bedside Disposition Plan: Home with home health  Consults called: Surgery paged repeatedly (Dr Barry Dienes) Admission status: inpatient   Severity  of Illness: The appropriate patient status for this patient is INPATIENT. Inpatient  status is judged to be reasonable and necessary in order to provide the required intensity of service to ensure the patient's safety. The patient's presenting symptoms, physical exam findings, and initial radiographic and laboratory data in the context of their chronic comorbidities is felt to place them at high risk for further clinical deterioration. Furthermore, it is not anticipated that the patient will be medically stable for discharge from the hospital within 2 midnights of admission. The following factors support the patient status of inpatient.   " The patient's presenting symptoms include right lower extremity wound. " The worrisome physical exam findings include draining ulcers in the right lower extremity. " The initial radiographic and laboratory data are worrisome because of soft tissue shadow but no osteomyelitis. " The chronic co-morbidities include chronic suppression.   * I certify that at the point of admission it is my clinical judgment that the patient will require inpatient hospital care spanning beyond 2 midnights from the point of admission due to high intensity of service, high risk for further deterioration and high frequency of surveillance required.Barbette Merino MD [facility] Pager [phone]  If 7PM-7AM, please contact night-coverage [email or web address] Password [facility]  01/04/2018, 6:44 PM

## 2018-01-04 NOTE — ED Notes (Signed)
Placed external cath on pt  

## 2018-01-04 NOTE — ED Provider Notes (Addendum)
[facility] EMERGENCY DEPARTMENT Provider Note   CSN: [id number] Arrival date & time: 01/04/18  1116     History   Chief Complaint Chief Complaint  Patient presents with  . Hyperglycemia    HPI [name] is a 52 y.o. female.  HPI  52 y.o.femalewith medical history of CKD, status post renal transplant on immunosuppression, hypertension, insulin-dependent diabetes comes into the ER with chief complaint of worsening leg pain.  Patient has history of chronic cellulitis and a pressure ulcer to her right lower extremity that is being managed by the wound care team.  Patient states that home nurse came in today and advised that patient come to the ER for worsening of her leg pain and swelling.  Patient was seen by wound care team yesterday and they had recommended debridement of the existing ulcer, but patient had refused.  Review of system is positive for weakness and nausea -patient however denies any fevers or chills.    Past Medical History:  Diagnosis Date  . Arthritis    "elbows, knees, legs, back" (09/24/2015)  . CKD (chronic kidney disease)   . Daily headache   . Depression    "years ago"  . End stage renal disease (HCC)    right arm AV graft, resolved post transplant  . Hypertension   . Hypothyroid   . 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 Kidney disease 2011  . Pneumonia ~ 2007?  [name] Type II diabetes mellitus (HCC)    Insulin dependant    Patient Active Problem List   Diagnosis Date Noted  . Cellulitis of leg, left 01/04/2018  . Acute on chronic congestive heart failure (HCC)   . HCAP (healthcare-associated pneumonia) 11/14/2017  . Localized swelling of lower extremity 11/05/2017  . Malnutrition of moderate degree 09/02/2017  . Pneumonia   . Status post kidney transplant 06/02/2017  . Closed displaced fracture of right femoral neck (HCC) 06/02/2017  . Femoral neck fracture (HCC) 06/02/2017  . Hyperglycemia  05/09/2017  . Lower extremity cellulitis 03/23/2017  . Type II diabetes mellitus (HCC) 03/23/2017  . Anemia of chronic disease 01/26/2017  . Abnormal LFTs 01/26/2017  . DKA (diabetic ketoacidoses) (HCC) 08/12/2016  . Hypoglycemia   . Wound cellulitis   . CKD (chronic kidney disease) stage 3, GFR 30-59 ml/min (HCC) 06/26/2016  . Cellulitis of right lower extremity   . QT prolongation 05/13/2016  . UTI (lower urinary tract infection)   . Hypomagnesemia   . Pressure ulcer 04/11/2016  . Acute encephalopathy 04/11/2016  . Altered mental status 04/10/2016  . Insulin dependent diabetes mellitus (HCC) 04/10/2016  . Diabetic ketoacidosis without coma associated with type 2 diabetes mellitus (HCC) 11/02/2015  . Uncontrolled type 1 diabetes mellitus with foot ulcer (HCC) 06/04/2015  . Foot ulcer, right (HCC)   . Diarrhea 06/02/2015  . Type 1 diabetes mellitus with diabetic foot ulcer (HCC) 06/02/2015  . Acute renal failure superimposed on stage 3 chronic kidney disease (HCC) 06/02/2015  . Diabetic foot infection (HCC) 06/01/2015  . Essential hypertension 06/01/2015  . GERD (gastroesophageal reflux disease) 06/01/2015  . AKI (acute kidney injury) (HCC) 01/02/2015  . H/O kidney transplant 08/04/2012  . Hyponatremia 09/22/2011  . Immunosuppression (HCC)   . Hypothyroidism     Past Surgical History:  Procedure Laterality Date  . AMPUTATION Left 05/11/2014   Procedure: AMPUTATION LEFT GREAT TOE;  Surgeon: Kathryne Hitch, MD;  Location: [facility];  Service: Orthopedics;  Laterality: Left;  .  AV FISTULA PLACEMENT Right    forearm  . BACK SURGERY    . CATARACT EXTRACTION W/ INTRAOCULAR LENS  IMPLANT, BILATERAL Bilateral   . CESAREAN SECTION  07/1999  . DG AV DIALYSIS GRAFT DECLOT OR    . HIP PINNING,CANNULATED Right 06/02/2017   Procedure: CANNULATED RIGHT HIP PINNING;  Surgeon: Beverely Low, MD;  Location: [facility];  Service: Orthopedics;  Laterality: Right;  . INCISION AND DRAINAGE Right  06/02/2015   Procedure: INCISION AND DRAINAGE OF RIGHT 3rd RAY RESECTION;  Surgeon: Kathryne Hitch, MD;  Location: [facility] OR;  Service: Orthopedics;  Laterality: Right;  . 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 LUMBAR DISC SURGERY  2001  . NEPHRECTOMY TRANSPLANTED ORGAN    . TOE AMPUTATION Right    1,2 & 3rd toes.  . TUBAL LIGATION  07/1999     OB History    Gravida      Para      Term      Preterm      AB      Living  1     SAB      TAB      Ectopic      Multiple      Live Births               Home Medications    Prior to Admission medications   Medication Sig Start Date End Date Taking? Authorizing Provider  acetaminophen (TYLENOL) 325 MG tablet Take 1 tablet (325 mg total) by mouth every 6 (six) hours as needed for mild pain, moderate pain or headache. 11/12/17  Yes Albertine Grates, MD  amLODipine (NORVASC) 5 MG tablet Take 1 tablet (5 mg total) by mouth daily. 11/20/17  Yes Burnadette Pop, MD  carvedilol (COREG) 25 MG tablet Take 1 tablet (25 mg total) by mouth 2 (two) times daily with a meal. 11/12/17  Yes Albertine Grates, MD  furosemide (LASIX) 40 MG tablet Take 40 mg by mouth daily. 12/09/17  Yes [provider]  Insulin Isophane & Regular Human (NOVOLIN 70/30 FLEXPEN RELION) (70-30) 100 UNIT/ML PEN Inject 20 Units into the skin 2 (two) times daily before a meal. PER SLIDING SCALE   Yes [provider]  lip balm (BLISTEX) OINT Apply 1 application topically as needed for lip care. 11/12/17  Yes Albertine Grates, MD  mycophenolate (CELLCEPT) 250 MG capsule Take 500 mg by mouth 2 (two) times daily.    Yes [provider]  predniSONE (DELTASONE) 5 MG tablet Take 5 mg by mouth daily.  05/29/15  Yes [provider]  senna-docusate (SENOKOT-S) 8.6-50 MG tablet Take 1 tablet by mouth at bedtime. 11/12/17  Yes Albertine Grates, MD  sodium bicarbonate 650 MG tablet Take 2 tablets (1,300 mg total) by mouth 2 (two) times daily. 09/04/17  Yes Penny Pia, MD    SYNTHROID 137 MCG tablet Take 137 mcg by mouth daily. 04/10/17  Yes [provider]  tacrolimus (PROGRAF) 0.5 MG capsule Take 0.5 mg by mouth at bedtime. 06/17/16  Yes [provider]  tacrolimus (PROGRAF) 1 MG capsule Take 1 mg by mouth daily. 11/20/17  Yes [provider]  vitamin B-12 (CYANOCOBALAMIN) 100 MCG tablet Take 1 tablet (100 mcg total) by mouth daily. 03/29/17 03/29/18 Yes Regalado, Belkys A, MD  HYDROcodone-acetaminophen (NORCO/VICODIN) 5-325 MG tablet Take 1 tablet by mouth every 6 (six) hours as needed for moderate pain. 01/01/18   Bethann Berkshire, MD  sulfamethoxazole-trimethoprim (BACTRIM DS,SEPTRA DS)  800-160 MG tablet Take 1 tablet by mouth 2 (two) times daily for 7 days. 01/01/18 01/08/18  Bethann Berkshire, MD    Family History Family History  Problem Relation Age of Onset  . Emphysema Mother   . Throat cancer Mother   . COPD Mother   . Cancer Mother   . Emphysema Father   . COPD Father   . Stroke Father   . ADD / ADHD Son     Social History Social History   Tobacco Use  . Smoking status: Former Smoker    Packs/day: 1.00    Years: 11.00    Pack years: 11.00    Types: Cigarettes    Last attempt to quit: 09/21/1998    Years since quitting: 19.3  . Smokeless tobacco: Never Used  Substance Use Topics  . Alcohol use: No  . Drug use: No     Allergies   Latex; Ace inhibitors; Adhesive [tape]; and Lisinopril   Review of Systems Review of Systems  Constitutional: Positive for activity change.  Skin: Positive for color change and wound.  Allergic/Immunologic: Positive for immunocompromised state.  Hematological: Does not bruise/bleed easily.  All other systems reviewed and are negative.    Physical Exam Updated Vital Signs BP (!) 141/67   Pulse 78   Temp 97.9 F (36.6 C) (Oral)   Resp 18   LMP 11/19/2011 Comment: pt had tubal ligation  SpO2 98%   Physical Exam  Constitutional: She is oriented to person, place, and time. She  appears well-developed.  HENT:  Head: Normocephalic and atraumatic.  Eyes: EOM are normal.  Neck: Normal range of motion. Neck supple.  Cardiovascular: Normal rate.  Pulmonary/Chest: Effort normal.  Abdominal: Bowel sounds are normal.  Musculoskeletal: She exhibits edema and tenderness. She exhibits no deformity.  Neurological: She is alert and oriented to person, place, and time.  Skin: Skin is warm and dry. Rash noted.  Erythematous right lower extremity.  Patient has a 4 cm diameter ulceration over the anterior tibial region.  There is yellow covering on top of the ulceration without any foul smell.  No drainage appreciated.  Tenderness to palpation  Nursing note and vitals reviewed.    ED Treatments / Results  Labs (all labs ordered are listed, but only abnormal results are displayed) Labs Reviewed  COMPREHENSIVE METABOLIC PANEL - Abnormal; Notable for the following components:      Result Value   Sodium 130 (*)    Chloride 100 (*)    CO2 16 (*)    Glucose, Bld 456 (*)    BUN 36 (*)    Creatinine, Ser 2.41 (*)    Calcium 8.7 (*)    Albumin 2.1 (*)    AST 11 (*)    ALT 11 (*)    Total Bilirubin 1.3 (*)    GFR calc non Af Amer 22 (*)    GFR calc Af Amer 26 (*)    All other components within normal limits  CBC WITH DIFFERENTIAL/PLATELET - Abnormal; Notable for the following components:   RBC 3.15 (*)    Hemoglobin 8.8 (*)    HCT 27.6 (*)    All other components within normal limits  URINALYSIS, COMPLETE (UACMP) WITH MICROSCOPIC - Abnormal; Notable for the following components:   APPearance CLOUDY (*)    Glucose, UA >=500 (*)    Hgb urine dipstick LARGE (*)    Ketones, ur 20 (*)    Protein, ur 100 (*)    Leukocytes, UA  MODERATE (*)    RBC / HPF >50 (*)    WBC, UA >50 (*)    Bacteria, UA MANY (*)    All other components within normal limits  CBG MONITORING, ED - Abnormal; Notable for the following components:   Glucose-Capillary 531 (*)    All other components  within normal limits  CULTURE, BLOOD (ROUTINE X 2)  CULTURE, BLOOD (ROUTINE X 2)  SEDIMENTATION RATE  C-REACTIVE PROTEIN  I-STAT CG4 LACTIC ACID, ED  I-STAT CG4 LACTIC ACID, ED    EKG None  Radiology Dg Tibia/fibula Right  Result Date: 01/04/2018 CLINICAL DATA:  Right lower leg swelling for the past 2 months. Clinical concern for gas-forming infection. EXAM: RIGHT TIBIA AND FIBULA - 2 VIEW COMPARISON:  01/01/2018. FINDINGS: Diffuse soft tissue swelling. Diffuse arterial calcifications. No soft tissue gas, bone destruction or periosteal reaction. An anterior mid lower leg soft tissue ulceration is again demonstrated. Diffuse osteopenia. IMPRESSION: 1. Anterior soft tissue ulceration without evidence of underlying osteomyelitis. 2. No soft tissue gas. 3. Extensive atheromatous arterial calcifications. Electronically Signed   By: Beckie Salts M.D.   On: 01/04/2018 14:47    Procedures Procedures (including critical care time)  Medications Ordered in ED Medications  vancomycin (VANCOCIN) 1,500 mg in sodium chloride 0.9 % 500 mL IVPB (1,500 mg Intravenous New Bag/Given 01/04/18 1602)  vancomycin (VANCOCIN) IVPB 1000 mg/200 mL premix (has no administration in time range)  piperacillin-tazobactam (ZOSYN) IVPB 3.375 g (3.375 g Intravenous New Bag/Given 01/04/18 1601)     Initial Impression / Assessment and Plan / ED Course  I have reviewed the triage vital signs and the nursing notes.  Pertinent labs & imaging results that were available during my care of the patient were reviewed by me and considered in my medical decision making (see chart for details).     52 year old female comes in with chief complaint of worsening swelling and pain in her right leg.  Patient has history of diabetes and immunosuppression due to her renal transplant medications.  Patient has chronic cellulitis and chronic ulcer which appears to have gotten worse over the past few days.  Patient was seen by wound care who  wanted to debride the ulcer, however patient had refused.  This morning she was asked to come to the ER by the home nurse based on her assessment.  Patient is noted to be hyperglycemic without DKA.  She has a concerning ulcer with surrounding cellulitis.  Patient was seen in the ER 2 days ago and was discharged with Bactrim which she did not fill because she could not afford the antibiotics.  Besides cellulitis and infected ulcer, differential diagnosis includes osteomyelitis and DVT, with the latter being less likely.  Sed rate and CRP have been ordered and if elevated the admitting team might want to get an MRI.  Additionally, with history of renal transplant patient is noted to have worsening of her creatinine.  We will start Vanco and Zosyn in the ER to cover for CMS sepsis requirement and also given IV fluid.  Patient's UA also looks dirty, will defer management for potential UTI to the admitting team.   Final Clinical Impressions(s) / ED Diagnoses   Final diagnoses:  Cellulitis of right lower extremity  Diabetic ulcer of lower leg (HCC)  Acute on chronic renal insufficiency    ED Discharge Orders    None       Derwood Kaplan, MD 01/04/18 1625    Derwood Kaplan, MD 01/04/18 1627

## 2018-01-04 NOTE — ED Notes (Signed)
Contacted main lab about adding on sed rate lab.

## 2018-01-04 NOTE — Progress Notes (Signed)
New Admission Note:   Arrival Method: Bed Mental Orientation: A&O X4 Telemetry: Initiated Assessment: Completed Skin: See Flowsheets IV: WDL Pain:10/10 Safety Measures: Safety Fall Prevention Plan has been given, discussed and signed Admission: Completed Unit Orientation: Patient has been orientated to the room, unit and staff.   Orders have been reviewed and implemented. Will continue to monitor the patient. Call light has been placed within reach and bed alarm has been activated.    Britt Bolognese RN, BSN

## 2018-01-04 NOTE — Progress Notes (Addendum)
Pharmacy Antibiotic Note  [name] is a 52 y.o. female admitted on 01/04/2018 with cellulitis.  Pharmacy has been consulted for vancomycin and Zosyn dosing. CrCl~31.  Received 1 gm ceftriaxone and vancomycin 1500 mg loading dose in ED.  Plan: Vancomycin 1,000 mg IV every 24 hours.  Goal trough 10-15 mcg/mL.  Zosyn 3.375 gm IV every 8 hours F/U cultures, LOT, and vancomycin trough as needed.    Temp (24hrs), Avg:97.9 F (36.6 C), Min:97.9 F (36.6 C), Max:97.9 F (36.6 C)  Recent Labs  Lab 01/01/18 1823 01/01/18 1859 01/04/18 1341 01/04/18 1426  WBC  --  7.3 6.4  --   CREATININE 1.80*  --  2.41*  --   LATICACIDVEN  --   --   --  1.54    Estimated Creatinine Clearance: 31.1 mL/min (A) (by C-G formula based on SCr of 2.41 mg/dL (H)).    Allergies  Allergen Reactions  . Latex Itching  . Ace Inhibitors Cough  . Adhesive [Tape] Itching and Other (See Comments)    Please use paper tape  . Lisinopril Cough    Antimicrobials this admission: Ceftriaxone x 1 Vancomycin 4/30 >> Zosyn 4/30 >>  Thank you for allowing pharmacy to be a part of this patient's care.  Lina Sar Jaleal Schliep 01/04/2018 2:59 PM

## 2018-01-04 NOTE — ED Triage Notes (Signed)
Patient arrived from home with 'high blood sugar'.

## 2018-01-05 ENCOUNTER — Inpatient Hospital Stay (HOSPITAL_COMMUNITY): Payer: Medicare Other

## 2018-01-05 ENCOUNTER — Encounter (HOSPITAL_COMMUNITY): Payer: Self-pay

## 2018-01-05 ENCOUNTER — Other Ambulatory Visit: Payer: Self-pay

## 2018-01-05 DIAGNOSIS — N189 Chronic kidney disease, unspecified: Secondary | ICD-10-CM

## 2018-01-05 DIAGNOSIS — N289 Disorder of kidney and ureter, unspecified: Secondary | ICD-10-CM

## 2018-01-05 LAB — GLUCOSE, CAPILLARY
GLUCOSE-CAPILLARY: 231 mg/dL — AB (ref 65–99)
GLUCOSE-CAPILLARY: 184 mg/dL — AB (ref 65–99)
Glucose-Capillary: 298 mg/dL — ABNORMAL HIGH (ref 65–99)
Glucose-Capillary: 75 mg/dL (ref 65–99)

## 2018-01-05 LAB — URINE CULTURE

## 2018-01-05 LAB — COMPREHENSIVE METABOLIC PANEL
ALBUMIN: 1.8 g/dL — AB (ref 3.5–5.0)
ALT: 11 U/L — ABNORMAL LOW (ref 14–54)
AST: 19 U/L (ref 15–41)
Alkaline Phosphatase: 78 U/L (ref 38–126)
Anion gap: 11 (ref 5–15)
BILIRUBIN TOTAL: 0.9 mg/dL (ref 0.3–1.2)
BUN: 34 mg/dL — AB (ref 6–20)
CO2: 16 mmol/L — ABNORMAL LOW (ref 22–32)
Calcium: 8.2 mg/dL — ABNORMAL LOW (ref 8.9–10.3)
Chloride: 108 mmol/L (ref 101–111)
Creatinine, Ser: 2.6 mg/dL — ABNORMAL HIGH (ref 0.44–1.00)
GFR calc Af Amer: 24 mL/min — ABNORMAL LOW (ref >60)
GFR calc non Af Amer: 20 mL/min — ABNORMAL LOW (ref >60)
GLUCOSE: 285 mg/dL — AB (ref 65–99)
POTASSIUM: 3.9 mmol/L (ref 3.5–5.1)
Sodium: 135 mmol/L (ref 135–145)
TOTAL PROTEIN: 5.7 g/dL — AB (ref 6.5–8.1)

## 2018-01-05 LAB — CBC
HCT: 26.8 % — ABNORMAL LOW (ref 36.0–46.0)
Hemoglobin: 8.3 g/dL — ABNORMAL LOW (ref 12.0–15.0)
MCH: 27.7 pg (ref 26.0–34.0)
MCHC: 31 g/dL (ref 30.0–36.0)
MCV: 89.3 fL (ref 78.0–100.0)
Platelets: 202 10*3/uL (ref 150–400)
RBC: 3 MIL/uL — ABNORMAL LOW (ref 3.87–5.11)
RDW: 15.5 % (ref 11.5–15.5)
WBC: 7.2 10*3/uL (ref 4.0–10.5)

## 2018-01-05 LAB — C DIFFICILE QUICK SCREEN W PCR REFLEX
C Diff antigen: NEGATIVE
C Diff interpretation: NOT DETECTED
C Diff toxin: NEGATIVE

## 2018-01-05 LAB — MRSA PCR SCREENING: MRSA by PCR: NEGATIVE

## 2018-01-05 MED ORDER — COLLAGENASE 250 UNIT/GM EX OINT
TOPICAL_OINTMENT | Freq: Every day | CUTANEOUS | Status: DC
  Administered 2018-01-05 – 2018-01-11 (×6): via TOPICAL
  Filled 2018-01-05 (×2): qty 30

## 2018-01-05 MED ORDER — INSULIN ASPART 100 UNIT/ML ~~LOC~~ SOLN
0.0000 [IU] | Freq: Every day | SUBCUTANEOUS | Status: DC
Start: 2018-01-05 — End: 2018-01-11
  Administered 2018-01-10: 2 [IU] via SUBCUTANEOUS

## 2018-01-05 MED ORDER — LOPERAMIDE HCL 2 MG PO CAPS
2.0000 mg | ORAL_CAPSULE | ORAL | Status: DC | PRN
Start: 2018-01-05 — End: 2018-01-11
  Administered 2018-01-05 – 2018-01-11 (×13): 2 mg via ORAL
  Filled 2018-01-05 (×13): qty 1

## 2018-01-05 MED ORDER — INSULIN ASPART 100 UNIT/ML ~~LOC~~ SOLN
0.0000 [IU] | Freq: Three times a day (TID) | SUBCUTANEOUS | Status: DC
  Administered 2018-01-05: 2 [IU] via SUBCUTANEOUS
  Administered 2018-01-07 – 2018-01-08 (×2): 1 [IU] via SUBCUTANEOUS
  Administered 2018-01-08: 3 [IU] via SUBCUTANEOUS
  Administered 2018-01-08: 1 [IU] via SUBCUTANEOUS
  Administered 2018-01-09: 7 [IU] via SUBCUTANEOUS
  Administered 2018-01-09: 2 [IU] via SUBCUTANEOUS
  Administered 2018-01-09 – 2018-01-11 (×3): 3 [IU] via SUBCUTANEOUS

## 2018-01-05 NOTE — Progress Notes (Addendum)
Inpatient Diabetes Program Recommendations  AACE/ADA: New Consensus Statement on Inpatient Glycemic Control (2015)  Target Ranges:  Prepandial:   less than 140 mg/dL      Peak postprandial:   less than 180 mg/dL (1-2 hours)      Critically ill patients:  140 - 180 mg/dL   Lab Results  Component Value Date   GLUCAP 231 (H) 01/05/2018   HGBA1C 8.9 (H) 11/11/2017    Review of Glycemic Control Results for GRAINNE, KNIGHTS (MRN [id number]) as of 01/05/2018 11:31  Ref. Range 01/04/2018 17:10 01/04/2018 21:58 01/05/2018 07:39 01/05/2018 11:35  Glucose-Capillary Latest Ref Range: 65 - 99 mg/dL 409 (H) 811 (H) 914 (H) 231 (H)   Diabetes history: Type 1 DM Outpatient Diabetes medications: Novolin 70/30 20 units BID Current orders for Inpatient glycemic control: Novolog 70/30 20 units BID, Prednisone 5 mg QD  Inpatient Diabetes Program Recommendations:    Would consider adding Novolog 0-9 units TID and Novolog 0-5 units QHS.   Addendum@1350 - Spoke with patient regarding home regimen for diabetes management. Patient states that she gives her own injections and she checks BS 3x/day.  Patient does admit to missing doses. Has been counseled by several of the coordinators of the team. Patient says that husband reminds her to take insulin. Offered idea of setting an alarm in home or on phone.  Reviewed patient's current A1c of 8.9%. Explained what a A1c is and what it measures. Also reviewed goal A1c with patient, importance of good glucose control @ home, and blood sugar goals. Reviewed patho for need for insulin, vascular changes that occurs given s/p kidney transplant, and additional comorbiditities.   Informed of Freestyle Libre and how this could be used as a tool for improvement to BS. Patient is interested in outpatient endocrinologist. Will place case management consult to help provide list of names. No additional questions at this time.  Thanks, Lujean Rave, MSN, RNC-OB Diabetes  Coordinator [phone] (8a-5p)

## 2018-01-05 NOTE — Progress Notes (Signed)
CM does not keep list for specialty providers.  NCM advised patient to follow up with Primary care provider.

## 2018-01-05 NOTE — Progress Notes (Addendum)
PROGRESS NOTE    [name]  KAJ:[id number] DOB: [birth year] DOA: 01/04/2018 PCP: Hayden Rasmussen, MD  Brief Narrative: 52 year old female with history of chronic kidney disease stage IV, history of renal transplant on immunosuppression, type 2 diabetes, chronic right leg wound presented to the emergency room with increasing swelling pain tenderness and discharge. -In addition she had diarrhea since arrival in the emergency room she was previously seen in the ED on 4/10 and treated with a course of Bactrim   Assessment & Plan:   1. Right leg wound with secondary infection and cellulitis -Large R leg wound followed at the wound center for 1 month, she is immunosuppressed secondary to transplant meds, previously Rx with Doxycycline and Bactrim -Continue broad-spectrum antibiotics with vancomycin and Zosyn -Check MRI right lower extremity -Will likely need orthopedic evaluation will await MRI scan -ESR is 93 and CRP is 3.9 -Check arterial duplex  2. AKI on CK D stage IV -prior history of renal transplant on chronic immunosuppressive therapy -I suspect acute worsening of her renal failure was secondary to recent Bactrim use -continue gentle IV fluids today -Renal dose medications and hold nephrotoxic meds  3. Hypertension -Continue Coreg, hold amlodipine and Lasix today  4. Diarrhea -Likely secondary to recent antibiotic use will rule out C. Difficile for completeness  5. Status post renal Transplant -Continue Prednisone 5 mg,CellCept and Prograf  6. Type 1 diabetes mellitus -continue home dose of insulin 7030, add sliding scale  DVT prophylaxis:heparin subcutaneous Code Status: full code Family Communication:no family at bedside Disposition Plan: home pending above workup, MRI scan and evaluation for surgical debridement  Consultants:      Procedures:   Antimicrobials:  Antibiotics Given (last 72 hours)    Date/Time Action Medication Dose Rate   01/04/18 1601  New Bag/Given   piperacillin-tazobactam (ZOSYN) IVPB 3.375 g 3.375 g 12.5 mL/hr   01/04/18 1602 New Bag/Given   vancomycin (VANCOCIN) 1,500 mg in sodium chloride 0.9 % 500 mL IVPB 1,500 mg 250 mL/hr   01/04/18 2329 New Bag/Given   piperacillin-tazobactam (ZOSYN) IVPB 3.375 g 3.375 g 12.5 mL/hr   01/05/18 0840 New Bag/Given   piperacillin-tazobactam (ZOSYN) IVPB 3.375 g 3.375 g 12.5 mL/hr      Subjective: -continues to have pain and discomfort in right lower leg, along with discharge, feels a little better than yesterday  Objective: Vitals:   01/04/18 1854 01/04/18 2158 01/05/18 0502 01/05/18 1012  BP: (!) 151/87 (!) 98/48 (!) 109/57 (!) 92/52  Pulse: 92 84 81 79  Resp: 20   18  Temp: 97.8 F (36.6 C) 98.7 F (37.1 C) 97.8 F (36.6 C) 97.9 F (36.6 C)  TempSrc: Oral Oral Oral Oral  SpO2: 96% (!) 87% 97% 100%  Weight:    83 kg (182 lb 15.7 oz)  Height:    5' 7" (1.702 m)    Intake/Output Summary (Last 24 hours) at 01/05/2018 1355 Last data filed at 01/05/2018 1300 Gross per 24 hour  Intake 980 ml  Output 275 ml  Net 705 ml   Filed Weights   01/05/18 1012  Weight: 83 kg (182 lb 15.7 oz)    Examination:  General exam: Appears calm and comfortable, chronically ill-appearing female Respiratory system: Clear to auscultation. Respiratory effort normal. Cardiovascular system: S1 & S2 heard, RRR. No JVD, murmurs, rubs, gallops Gastrointestinal system: Abdomen is nondistended, soft and nontender.Normal bowel sounds heard. Central nervous system: Alert and oriented. No focal neurological deficits. 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 Mood & affect appropriate.     Data Reviewed:   CBC: Recent Labs  Lab 01/01/18 1859  01/04/18 1341 01/04/18 1724 01/05/18 0517  WBC 7.3 6.4 8.1 7.2  NEUTROABS 5.6 5.3  --   --   HGB 9.1* 8.8* 8.7* 8.3*  HCT 28.5* 27.6* 27.6* 26.8*  MCV 88.8 87.6 87.1 89.3  PLT 238 239 273 390   Basic Metabolic Panel: Recent Labs  Lab 01/01/18 1823 01/04/18 1341 01/04/18 1724 01/05/18 0517  NA 134* 130*  --  135  K 3.9 3.7  --  3.9  CL 106 100*  --  108  CO2 20* 16*  --  16*  GLUCOSE 388* 456*  --  285*  BUN 35* 36*  --  34*  CREATININE 1.80* 2.41* 2.27* 2.60*  CALCIUM 8.5* 8.7*  --  8.2*   GFR: Estimated Creatinine Clearance: 28.7 mL/min (A) (by C-G formula based on SCr of 2.6 mg/dL (H)). Liver Function Tests: Recent Labs  Lab 01/01/18 1840 01/04/18 1341 01/05/18 0517  AST 15 11* 19  ALT 12* 11* 11*  ALKPHOS 94 99 78  BILITOT 0.6 1.3* 0.9  PROT 7.1 6.8 5.7*  ALBUMIN 2.3* 2.1* 1.8*   No results for input(s): LIPASE, AMYLASE in the last 168 hours. No results for input(s): AMMONIA in the last 168 hours. Coagulation Profile: No results for input(s): INR, PROTIME in the last 168 hours. Cardiac Enzymes: No results for input(s): CKTOTAL, CKMB, CKMBINDEX, TROPONINI in the last 168 hours. BNP (last 3 results) No results for input(s): PROBNP in the last 8760 hours. HbA1C: No results for input(s): HGBA1C in the last 72 hours. CBG: Recent Labs  Lab 01/04/18 1138 01/04/18 1710 01/04/18 2158 01/05/18 0739 01/05/18 1135  GLUCAP 531* 316* 200* 298* 231*   Lipid Profile: No results for input(s): CHOL, HDL, LDLCALC, TRIG, CHOLHDL, LDLDIRECT in the last 72 hours. Thyroid Function Tests: No results for input(s): TSH, T4TOTAL, FREET4, T3FREE, THYROIDAB in the last 72 hours. Anemia Panel: No results for input(s): VITAMINB12, FOLATE, FERRITIN, TIBC, IRON, RETICCTPCT in the last 72 hours. Urine analysis:    Component Value Date/Time   COLORURINE YELLOW 01/04/2018 1341   APPEARANCEUR CLOUDY (A) 01/04/2018 1341   LABSPEC 1.015 01/04/2018 1341   PHURINE 6.0 01/04/2018  1341   GLUCOSEU >=500 (A) 01/04/2018 1341   HGBUR LARGE (A) 01/04/2018 1341   BILIRUBINUR NEGATIVE 01/04/2018 1341   KETONESUR 20 (A) 01/04/2018 1341   PROTEINUR 100 (A) 01/04/2018 1341   UROBILINOGEN 0.2 03/02/2015 1153   NITRITE NEGATIVE 01/04/2018 1341   LEUKOCYTESUR MODERATE (A) 01/04/2018 1341   Sepsis Labs: _0 (procalcitonin:4,lacticidven:4)  ) Recent Results (from the past 240 hour(s))  Urine culture     Status: Abnormal   Collection Time: 01/01/18  8:52 PM  Result Value Ref Range Status   Specimen Description   Final    URINE, RANDOM Performed at [facility], [place] [address] [place], [place] [address]    Special Requests   Final    NONE Performed at [facility], [place] [address] [place], [place] [address]    Culture (A)  Final    <10,000 COLONIES/mL INSIGNIFICANT GROWTH Performed at [place] [address], [place], [place] [address]  Report Status 01/03/2018 FINAL  Final  Blood Culture (routine x 2)     Status: None (Preliminary result)   Collection Time: 01/04/18  3:34 PM  Result Value Ref Range Status   Specimen Description BLOOD RIGHT ARM  Final   Special Requests   Final    BOTTLES DRAWN AEROBIC AND ANAEROBIC Blood Culture adequate volume   Culture   Final    NO GROWTH < 24 HOURS Performed at [facility], [place] [address] [place], [place] [address]    Report Status PENDING  Incomplete  Blood Culture (routine x 2)     Status: None (Preliminary result)   Collection Time: 01/04/18  4:00 PM  Result Value Ref Range Status   Specimen Description BLOOD LEFT WRIST  Final   Special Requests   Final    BOTTLES DRAWN AEROBIC AND ANAEROBIC Blood Culture adequate volume   Culture   Final    NO GROWTH < 24 HOURS Performed at [facility], [place] [address] [place], [place] [address]    Report Status PENDING  Incomplete  C difficile quick scan w PCR reflex     Status: None   Collection Time:  01/04/18  5:45 PM  Result Value Ref Range Status   C Diff antigen NEGATIVE NEGATIVE Final   C Diff toxin NEGATIVE NEGATIVE Final   C Diff interpretation No C. difficile detected.  Final  MRSA PCR Screening     Status: None   Collection Time: 01/04/18  7:47 PM  Result Value Ref Range Status   MRSA by PCR NEGATIVE NEGATIVE Final    Comment:        The GeneXpert MRSA Assay (FDA approved for NASAL specimens only), is one component of a comprehensive MRSA colonization surveillance program. It is not intended to diagnose MRSA infection nor to guide or monitor treatment for MRSA infections. Performed at [facility], [place] [address], [place], [place] [address]          Radiology Studies: Dg Tibia/fibula Right  Result Date: 01/04/2018 CLINICAL DATA:  Right lower leg swelling for the past 2 months. Clinical concern for gas-forming infection. EXAM: RIGHT TIBIA AND FIBULA - 2 VIEW COMPARISON:  01/01/2018. FINDINGS: Diffuse soft tissue swelling. Diffuse arterial calcifications. No soft tissue gas, bone destruction or periosteal reaction. An anterior mid lower leg soft tissue ulceration is again demonstrated. Diffuse osteopenia. IMPRESSION: 1. Anterior soft tissue ulceration without evidence of underlying osteomyelitis. 2. No soft tissue gas. 3. Extensive atheromatous arterial calcifications. Electronically Signed   By: Claudie Revering M.D.   On: 01/04/2018 14:47        Scheduled Meds: . carvedilol  25 mg Oral BID WC  . heparin  5,000 Units Subcutaneous Q8H  . insulin aspart  0-5 Units Subcutaneous QHS  . insulin aspart  0-9 Units Subcutaneous TID WC  . insulin aspart protamine- aspart  20 Units Subcutaneous BID AC  . levothyroxine  137 mcg Oral Daily  . mycophenolate  500 mg Oral BID  . predniSONE  5 mg Oral Daily  . senna-docusate  1 tablet Oral QHS  . sodium bicarbonate  1,300 mg Oral BID  . tacrolimus  0.5 mg Oral QHS  . tacrolimus  1 mg Oral Daily  . vitamin B-12  100 mcg  Oral Daily   Continuous Infusions: . sodium chloride 75 mL/hr at 01/05/18 0936  . piperacillin-tazobactam (ZOSYN)  IV Stopped (01/05/18 1219)  . vancomycin  LOS: 1 day    Time spent: 33mn    PDomenic Polite MD [facility] Page via [email or web address], password [email or web address] After 7PM please contact night-coverage  01/05/2018, 1:55 PM

## 2018-01-05 NOTE — Consult Note (Addendum)
WOC Nurse wound consult note Reason for Consult:RLE pretibial ulcer, unstageable full thickness, right lateral foot ulcer, unstageable full thickness. Wound type: venous and arterial insufficiency Pressure Injury POA: NA Measurement: right pretibial 3cm x 3cm x 1.4cm, 100% yellow wound bed, scant bloody exudate, redness surrounding area 3cm. Right lateral foot 2cm round with black hard shell wound bed with redness of 3cm surrounding, scant exudate, no odor.  Wound bed:see above Drainage (amount, consistency, odor) see above Periwound:see above  Dressing procedure/placement/frequency:Patient has already had xray of right lower leg which was negative for osteomyelitis. Will order Betadine Q Shift to right lateral foot ulcer, Prevalon boots. Will order Hydrotherapy and Santyl enzymatic debridement wound care for right pretibial ulcer. Patient's nutritional state is poor, recommend Nutritional Consult to help with wound healing, please order if you agree. Due to lateness in the day, hydro will begin tomorrow, bedside RN to do dressing today.  WOC team will see weekly during hydrotherapy visit to make sure pt is progressing.  Please re-consult in between our visits if we need to assist further.  Barnett Hatter, RN-C, WTA-C, [facility] Wound Treatment Associate Ostomy Care Associate

## 2018-01-06 ENCOUNTER — Inpatient Hospital Stay (HOSPITAL_COMMUNITY): Payer: Medicare Other

## 2018-01-06 DIAGNOSIS — L0390 Cellulitis, unspecified: Secondary | ICD-10-CM

## 2018-01-06 DIAGNOSIS — I96 Gangrene, not elsewhere classified: Secondary | ICD-10-CM

## 2018-01-06 DIAGNOSIS — L97919 Non-pressure chronic ulcer of unspecified part of right lower leg with unspecified severity: Secondary | ICD-10-CM

## 2018-01-06 DIAGNOSIS — L97909 Non-pressure chronic ulcer of unspecified part of unspecified lower leg with unspecified severity: Secondary | ICD-10-CM

## 2018-01-06 DIAGNOSIS — E11622 Type 2 diabetes mellitus with other skin ulcer: Secondary | ICD-10-CM

## 2018-01-06 DIAGNOSIS — I83019 Varicose veins of right lower extremity with ulcer of unspecified site: Secondary | ICD-10-CM

## 2018-01-06 LAB — COMPREHENSIVE METABOLIC PANEL
ALK PHOS: 70 U/L (ref 38–126)
ALT: 155 U/L — ABNORMAL HIGH (ref 14–54)
ANION GAP: 9 (ref 5–15)
AST: 349 U/L — ABNORMAL HIGH (ref 15–41)
Albumin: 1.8 g/dL — ABNORMAL LOW (ref 3.5–5.0)
BILIRUBIN TOTAL: 0.6 mg/dL (ref 0.3–1.2)
BUN: 35 mg/dL — ABNORMAL HIGH (ref 6–20)
CO2: 18 mmol/L — AB (ref 22–32)
Calcium: 8.6 mg/dL — ABNORMAL LOW (ref 8.9–10.3)
Chloride: 110 mmol/L (ref 101–111)
Creatinine, Ser: 2.79 mg/dL — ABNORMAL HIGH (ref 0.44–1.00)
GFR calc non Af Amer: 19 mL/min — ABNORMAL LOW (ref >60)
GFR, EST AFRICAN AMERICAN: 22 mL/min — AB (ref >60)
Glucose, Bld: 79 mg/dL (ref 65–99)
Potassium: 3.5 mmol/L (ref 3.5–5.1)
SODIUM: 137 mmol/L (ref 135–145)
TOTAL PROTEIN: 6.3 g/dL — AB (ref 6.5–8.1)

## 2018-01-06 LAB — GLUCOSE, CAPILLARY
GLUCOSE-CAPILLARY: 57 mg/dL — AB (ref 65–99)
GLUCOSE-CAPILLARY: 68 mg/dL (ref 65–99)
GLUCOSE-CAPILLARY: 75 mg/dL (ref 65–99)
Glucose-Capillary: 87 mg/dL (ref 65–99)
Glucose-Capillary: 61 mg/dL — ABNORMAL LOW (ref 65–99)

## 2018-01-06 LAB — CBC
HCT: 28.5 % — ABNORMAL LOW (ref 36.0–46.0)
HEMOGLOBIN: 8.9 g/dL — AB (ref 12.0–15.0)
MCH: 27.9 pg (ref 26.0–34.0)
MCHC: 31.2 g/dL (ref 30.0–36.0)
MCV: 89.3 fL (ref 78.0–100.0)
Platelets: 183 10*3/uL (ref 150–400)
RBC: 3.19 MIL/uL — ABNORMAL LOW (ref 3.87–5.11)
RDW: 15.8 % — AB (ref 11.5–15.5)
WBC: 4.6 10*3/uL (ref 4.0–10.5)

## 2018-01-06 LAB — CK: Total CK: 11 U/L — ABNORMAL LOW (ref 38–234)

## 2018-01-06 MED ORDER — INSULIN ASPART PROT & ASPART (70-30 MIX) 100 UNIT/ML ~~LOC~~ SUSP
10.0000 [IU] | Freq: Two times a day (BID) | SUBCUTANEOUS | Status: DC
  Administered 2018-01-06 (×2): 10 [IU] via SUBCUTANEOUS

## 2018-01-06 MED ORDER — SODIUM CHLORIDE 0.9 % IV SOLN
350.0000 mg | INTRAVENOUS | Status: DC
  Administered 2018-01-06: 350 mg via INTRAVENOUS
  Filled 2018-01-06 (×2): qty 7

## 2018-01-06 NOTE — Consult Note (Signed)
ORTHOPAEDIC CONSULTATION  REQUESTING PHYSICIAN: Zannie Cove, MD  Chief Complaint: Painful ulcer lateral right foot and right leg.  HPI: [name] is a 52 y.o. 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  Has had a chronic ulcer over the anterior tibia which is painful and a ulcer over the base of the fifth metatarsal which is also painful.  Patient states that she is just started saying a wound clinic doctor for the past week and initial treatment was for wound debridement that was painful.  Past Medical History:  Diagnosis Date  . Arthritis    "elbows, knees, legs, back" (09/24/2015)  . CKD (chronic kidney disease)   . Daily headache   . Depression    "years ago"  . End stage renal disease (HCC)    right arm AV graft, resolved post transplant  . Hypertension   . Hypothyroid   . 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 Kidney disease 2011  . Pneumonia ~ 2007?  [name] Type II diabetes mellitus (HCC)    Insulin dependant   Past Surgical History:  Procedure Laterality Date  . AMPUTATION Left 05/11/2014   Procedure: AMPUTATION LEFT GREAT TOE;  Surgeon: Kathryne Hitch, MD;  Location: [facility];  Service: Orthopedics;  Laterality: Left;  . AV FISTULA PLACEMENT Right    forearm  . BACK SURGERY    . CATARACT EXTRACTION W/ INTRAOCULAR LENS  IMPLANT, BILATERAL Bilateral   . CESAREAN SECTION  07/1999  . DG AV DIALYSIS GRAFT DECLOT OR    . HIP PINNING,CANNULATED Right 06/02/2017   Procedure: CANNULATED RIGHT HIP PINNING;  Surgeon: Beverely Low, MD;  Location: [facility];  Service: Orthopedics;  Laterality: Right;  . INCISION AND DRAINAGE Right 06/02/2015   Procedure: INCISION AND DRAINAGE OF RIGHT 3rd RAY RESECTION;  Surgeon: Kathryne Hitch, MD;  Location: [facility];  Service: Orthopedics;  Laterality: Right;  . 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 LUMBAR DISC SURGERY  2001  . NEPHRECTOMY  TRANSPLANTED ORGAN    . TOE AMPUTATION Right    1,2 & 3rd toes.  . TUBAL LIGATION  07/1999   Social History   Socioeconomic History  . Marital status: Married    Spouse name: Not on file  . Number of children: Not on file  . Years of education: Not on file  . Highest education level: Not on file  Occupational History  . Occupation: disabled  Social Needs  . Financial resource strain: Not on file  . Food insecurity:    Worry: Not on file    Inability: Not on file  . Transportation needs:    Medical: Not on file    Non-medical: Not on file  Tobacco Use  . Smoking status: Former Smoker    Packs/day: 1.00    Years: 11.00    Pack years: 11.00    Types: Cigarettes    Last attempt to quit: 09/21/1998    Years since quitting: 19.3  . Smokeless tobacco: Never Used  Substance and Sexual Activity  . Alcohol use: No  . Drug use: No  . Sexual activity: Never  Lifestyle  . Physical activity:    Days per week: Not on file    Minutes per session: Not on file  . Stress: Not on file  Relationships  . Social connections:    Talks on phone: Not on file    Gets together: Not on file  Attends religious service: Not on file    Active member of club or organization: Not on file    Attends meetings of clubs or organizations: Not on file    Relationship status: Not on file  Other Topics Concern  . Not on file  Social History Narrative  . Not on file   Family History  Problem Relation Age of Onset  . Emphysema Mother   . Throat cancer Mother   . COPD Mother   . Cancer Mother   . Emphysema Father   . COPD Father   . Stroke Father   . ADD / ADHD Son    - negative except otherwise stated in the family history section Allergies  Allergen Reactions  . Latex Itching  . Ace Inhibitors Cough  . Adhesive [Tape] Itching and Other (See Comments)    Please use paper tape  . Lisinopril Cough   Prior to Admission medications   Medication Sig Start Date End Date Taking? Authorizing  Provider  acetaminophen (TYLENOL) 325 MG tablet Take 1 tablet (325 mg total) by mouth every 6 (six) hours as needed for mild pain, moderate pain or headache. 11/12/17  Yes Albertine Grates, MD  amLODipine (NORVASC) 5 MG tablet Take 1 tablet (5 mg total) by mouth daily. 11/20/17  Yes Burnadette Pop, MD  carvedilol (COREG) 25 MG tablet Take 1 tablet (25 mg total) by mouth 2 (two) times daily with a meal. 11/12/17  Yes Albertine Grates, MD  furosemide (LASIX) 40 MG tablet Take 40 mg by mouth daily. 12/09/17  Yes [provider]  Insulin Isophane & Regular Human (NOVOLIN 70/30 FLEXPEN RELION) (70-30) 100 UNIT/ML PEN Inject 20 Units into the skin 2 (two) times daily before a meal. PER SLIDING SCALE   Yes [provider]  lip balm (BLISTEX) OINT Apply 1 application topically as needed for lip care. 11/12/17  Yes Albertine Grates, MD  mycophenolate (CELLCEPT) 250 MG capsule Take 500 mg by mouth 2 (two) times daily.    Yes [provider]  predniSONE (DELTASONE) 5 MG tablet Take 5 mg by mouth daily.  05/29/15  Yes [provider]  senna-docusate (SENOKOT-S) 8.6-50 MG tablet Take 1 tablet by mouth at bedtime. 11/12/17  Yes Albertine Grates, MD  sodium bicarbonate 650 MG tablet Take 2 tablets (1,300 mg total) by mouth 2 (two) times daily. 09/04/17  Yes Penny Pia, MD  SYNTHROID 137 MCG tablet Take 137 mcg by mouth daily. 04/10/17  Yes [provider]  tacrolimus (PROGRAF) 0.5 MG capsule Take 0.5 mg by mouth at bedtime. 06/17/16  Yes [provider]  tacrolimus (PROGRAF) 1 MG capsule Take 1 mg by mouth daily. 11/20/17  Yes [provider]  vitamin B-12 (CYANOCOBALAMIN) 100 MCG tablet Take 1 tablet (100 mcg total) by mouth daily. 03/29/17 03/29/18 Yes Regalado, Belkys A, MD  HYDROcodone-acetaminophen (NORCO/VICODIN) 5-325 MG tablet Take 1 tablet by mouth every 6 (six) hours as needed for moderate pain. 01/01/18   Bethann Berkshire, MD  sulfamethoxazole-trimethoprim (BACTRIM DS,SEPTRA DS) 800-160 MG  tablet Take 1 tablet by mouth 2 (two) times daily for 7 days. 01/01/18 01/08/18  Bethann Berkshire, MD   Mr Tibia Fibula Right Wo Contrast  Result Date: 01/05/2018 CLINICAL DATA:  52 year old female with history of chronic renal disease, stage IV with renal transplant on immunosuppression, type 2 diabetes and chronic right leg wound presenting with increased pain and swelling as well as discharge. EXAM: MRI OF LOWER RIGHT EXTREMITY WITHOUT CONTRAST TECHNIQUE: Multiplanar, multisequence  MR imaging of the right leg was performed. No intravenous contrast was administered. COMPARISON:  Radiographs of the right leg from 01/04/2018 FINDINGS: Bones/Joint/Cartilage Mild degenerative joint space narrowing of the included knees. No focal chondral defects. No significant joint effusion. No marrow signal abnormalities of the included tibia and fibula of either leg to suggest acute osteomyelitis. No acute fracture bone destruction. Ligaments Noncontributory Muscles and Tendons Generalized muscle atrophy and myositis of both included legs. Soft tissues Right leg: There is an approximately 2.4 x 1.6 x 0.9 cm (CC x AP x Trv) ulcer along the anterolateral aspect of the right mid leg. This is associated with a thin tracking layer of subcutaneous fluid spanning at least 10.6 x 4.4 x 0.7 cm in CC by AP by transverse, series 4/19 and series 6/30. Similar ovoid linear fluid collections are seen interposed between the gastrocnemius and soleus muscles of the right leg, the more lateral measuring at least 11.6 x 2.7 x 1.2 cm and the more medial measuring at least 11.6 x 2.1 x 1.4 cm. Left leg: Large subcutaneous tracking fluid collection measuring 22.4 x 5.8 x 2.2 cm, series 5/45 and series 4/6 overlying the gastrocnemius muscles. Additional tracking fluid measuring 18.3 x 5.6 x 1.4 cm is seen interposed between the gastrocnemius and soleus muscles. IMPRESSION: 1. 2.4 x 1.6 x 0.9 cm ulcer involving the anterolateral aspect of the right mid leg  associated with a thin tracking layer of soft cutaneous fluid measuring 10.6 x 4.4 x 0.7 cm. Two additional tracking fluid collections are seen interposed between the gastrocnemius and soleus muscles of the right leg measuring 11.6 x 2.7 x 1.2 cm laterally and 11.6 x 2.1 x 1.4 cm medially. 2. Tracking fluid collections in the subcutaneous soft tissues of the posterior left leg measuring 22.4 x 5.8 x 2.2 cm with a slightly deeper tracking fluid collection interposed between the gastrocnemius and soleus muscles measuring 18.3 x 5.6 x 1.4 cm. 3. No associated marrow signal abnormalities to indicate acute osteomyelitis of either included leg. 4. Generalized diffuse soft tissue and intramuscular edema compatible with cellulitis/third spacing of fluid and myositis of both legs. Electronically Signed   By: Tollie Eth M.D.   On: 01/05/2018 21:27   - pertinent xrays, CT, MRI studies were reviewed and independently interpreted  Positive ROS: All other systems have been reviewed and were otherwise negative with the exception of those mentioned in the HPI and as above.  Physical Exam: General: Alert, no acute distress Psychiatric: Patient is competent for consent with normal mood and affect Lymphatic: No axillary or cervical lymphadenopathy Cardiovascular: No pedal edema Respiratory: No cyanosis, no use of accessory musculature GI: No organomegaly, abdomen is soft and non-tender    Images:  @  Labs:  Lab Results  Component Value Date   HGBA1C 8.9 (H) 11/11/2017   HGBA1C 9.4 (H) 04/25/2017   HGBA1C 9.4 (H) 04/13/2017   ESRSEDRATE 93 (H) 01/04/2018   ESRSEDRATE 100 (H) 11/05/2017   ESRSEDRATE 22 06/28/2016   CRP 3.9 (H) 01/04/2018   CRP 32.3 (H) 11/05/2017   CRP 7.5 (H) 06/28/2016   REPTSTATUS 01/05/2018 FINAL 01/04/2018   GRAMSTAIN  06/27/2016    MODERATE WBC PRESENT, PREDOMINANTLY PMN ABUNDANT GRAM POSITIVE COCCI IN PAIRS IN CLUSTERS Performed at [facility]    CULT  MULTIPLE SPECIES PRESENT, SUGGEST RECOLLECTION (A) 01/04/2018   LABORGA METHICILLIN RESISTANT STAPHYLOCOCCUS AUREUS 06/27/2016     Neurologic: Patient does not have protective sensation bilateral lower extremities.  MUSCULOSKELETAL:   Skin: Examination patient has mixed arterial and venous insufficiency as she has brawny skin color changes in both legs with pitting edema and venous swelling she also has an ischemic ulcer, is 10 mm in diameter 0.1 mm deep with black eschar over the base of the fifth metatarsal right foot.  Patient's venous ulcer over the anterior aspect of the tibia is 2 cm in diameter 2 cm deep deep with necrotic tissue at the base of the wound.  Patient does not have a palpable dorsalis pedis or posterior tibial pulse.  Ankle-brachial indices shows noncompressible vessels monophasic.  MRI scan shows fluid in the soft tissue but no signs of osteomyelitis.  Patient has had multiple toe and ray amputations on both feet.  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  Plan: Plan: Recommend consultation with vascular vein surgery to see if revascularization is an option.  Patient does not have sufficient circulation to heal the ischemic ulcer over the base of the fifth metatarsal and with the mixed arterial venous insufficiency proper compression would possibly cause more ischemic changes in the leg to treat the venous ulcer.  Thank you for the consult and the opportunity to see Ms. Vernie Shanks, MD [facility] [phone] 6:16 PM

## 2018-01-06 NOTE — Progress Notes (Signed)
Received IV consult. Upon assessment, noted pt has working IV in Right posterior FA. Spoke with primary nurse regarding consult. Nurse states pt only needs one IV. Consult removed.

## 2018-01-06 NOTE — Progress Notes (Signed)
Physical Therapy Wound Evaluation and Treatment Patient Details  Name: NOYA SANTARELLI MRN: [id number] Date of Birth: 13-Aug-1966  Today's Date: 01/06/2018 Time: 2725-3664 Time Calculation (min): 32 min  Subjective  Subjective: I hope it doesn't hurt too much Patient and Family Stated Goals: heal wound Date of Onset: (unsure/ a couple months)  Pain Score:   Pt premedicated prior to treatment. Pt reports that treatment did not hurt as much as she thought it would  Wound Assessment  Pressure Injury 01/04/18 Unstageable - Full thickness tissue loss in which the base of the ulcer is covered by slough (yellow, tan, gray, green or brown) and/or eschar (tan, brown or black) in the wound bed. Yellow (Active)  Wound Image   01/06/2018 11:00 AM  Dressing Type Moist to dry;Other (Comment);Compression wrap;Gauze (Comment) 01/06/2018 11:00 AM  Dressing Clean;Dry;Intact;Changed 01/06/2018 11:00 AM  Dressing Change Frequency Daily 01/06/2018 11:00 AM  State of Healing Non-healing 01/06/2018 11:00 AM  Site / Wound Assessment Yellow 01/06/2018 11:00 AM  % Wound base Red or Granulating 0% 01/04/2018  9:03 PM  % Wound base Yellow/Fibrinous Exudate 100% 01/06/2018 11:00 AM  % Wound base Black/Eschar 0% 01/04/2018  9:03 PM  % Wound base Other/Granulation Tissue (Comment) 0% 01/04/2018  9:03 PM  Peri-wound Assessment Intact;Pink;Edema;Erythema (blanchable) 01/06/2018 11:00 AM  Wound Length (cm) 3.5 cm 01/06/2018 11:00 AM  Wound Width (cm) 3 cm 01/06/2018 11:00 AM  Wound Depth (cm) 0.8 cm 01/06/2018 11:00 AM  Wound Surface Area (cm^2) 10.5 cm^2 01/06/2018 11:00 AM  Wound Volume (cm^3) 8.4 cm^3 01/06/2018 11:00 AM  Margins Unattached edges (unapproximated) 01/06/2018 11:00 AM  Drainage Amount Minimal 01/06/2018 11:00 AM  Drainage Description Serosanguineous;Odor 01/06/2018 11:00 AM  Treatment Debridement (Selective);Hydrotherapy (Pulse lavage);Packing (Saline gauze) 01/06/2018 11:00 AM      Hydrotherapy Pulsed lavage therapy - wound  location:  R tibia  Pulsed Lavage with Suction (psi): 8 psi(8-12 psi) Pulsed Lavage with Suction - Normal Saline Used: 1000 mL Pulsed Lavage Tip: Tip with splash shield Selective Debridement Selective Debridement - Location: R tibia Selective Debridement - Tools Used: Forceps;Scissors Selective Debridement - Tissue Removed: yellow slough loosened by pulsed lavage   Wound Assessment and Plan  Wound Therapy - Assess/Plan/Recommendations Wound Therapy - Clinical Statement: Proximal tibial wound fully covered with yellow slough which lossened with pulsed lavage and small amount able to be selectively debrided , before application of Santyl for enzymatic debridement. Pt will benefit from continued pulsed lavage and selective debridement to decrease bioburden and promote wound healing.  Wound Therapy - Functional Problem List: decreased healing capacity due to medication  Factors Delaying/Impairing Wound Healing: Diabetes Mellitus;Multiple medical problems;Polypharmacy Hydrotherapy Plan: Debridement;Patient/family education;Pulsatile lavage with suction Wound Therapy - Frequency: 6X / week Wound Therapy - Follow Up Recommendations: Home health RN Wound Plan: see above  Wound Therapy Goals- Improve the function of patient's integumentary system by progressing the wound(s) through the phases of wound healing (inflammation - proliferation - remodeling) by: Decrease Necrotic Tissue to: 30 Decrease Necrotic Tissue - Progress: Goal set today Increase Granulation Tissue to: 70 Increase Granulation Tissue - Progress: Goal set today Goals/treatment plan/discharge plan were made with and agreed upon by patient/family: Yes Time For Goal Achievement: 7 days Wound Therapy - Potential for Goals: Fair  Goals will be updated until maximal potential achieved or discharge criteria met.  Discharge criteria: when goals achieved, discharge from hospital, MD decision/surgical intervention, no progress towards  goals, refusal/missing three consecutive treatments without notification or medical reason.  GP  Neita Landrigan B. Migdalia Dk PT, DPT Acute Rehabilitation  [phone] Pager [phone]   [place] 01/06/2018, 1:39 PM

## 2018-01-06 NOTE — Care Management Note (Addendum)
Case Management Note  Patient Details  Name: [name] MRN: [id number] Date of Birth: 07-Apr-1966  Subjective/Objective: History of CKD stage IV, renal transplant on immunosuppression, type 2 diabetes, chronic right leg wound; admitted for wound cellulitis with secondary infection/abscess.  -extensive subcutaneous fluid collection noted on MRI        Action/Plan: Orthopedics consulted discussed with Dr.Duda, regarding need for I & D Prior to admission patient lived at home with spouse.  Will be returning to the same living situation after discharge.  At discharge, patient has transportation home.  Patient has the ability to pay for medications and food.  Expected Discharge Date:  01/08/18               Expected Discharge Plan:  Home/Self Care  In-House Referral:   N/A  Discharge planning Services  CM Consult  Post Acute Care Choice:    Choice offered to:     DME Arranged:    DME Agency:     HH Arranged:    HH Agency:     Status of Service:  In process, will continue to follow  Yancey Flemings, RN 01/06/2018, 4:04 PM

## 2018-01-06 NOTE — Progress Notes (Signed)
VASCULAR LAB PRELIMINARY  ARTERIAL  ABI completed:  ABIs not ascertained secondary to calcified vessels.  Toe pressures are abnormal.    RIGHT    LEFT    PRESSURE WAVEFORM  PRESSURE WAVEFORM  BRACHIAL 147 T BRACHIAL 113 T  DP >254 B DP >254 M  AT   AT    PT >254 B PT >254 B  PER   PER    3rd TOE 0.18 NA 2nd TOE 0.40 NA    RIGHT LEFT  ABI Non comp Non comp     [name], RVT 01/06/2018, 3:26 PM

## 2018-01-06 NOTE — Progress Notes (Signed)
Inpatient Diabetes Program Recommendations  AACE/ADA: New Consensus Statement on Inpatient Glycemic Control (2015)  Target Ranges:  Prepandial:   less than 140 mg/dL      Peak postprandial:   less than 180 mg/dL (1-2 hours)      Critically ill patients:  140 - 180 mg/dL   Lab Results  Component Value Date   GLUCAP 87 01/06/2018   HGBA1C 8.9 (H) 11/11/2017    Review of Glycemic Control Results for DASHANA, GUIZAR (MRN [id number]) as of 01/06/2018 11:03  Ref. Range 01/05/2018 16:36 01/05/2018 20:19 01/06/2018 08:06 01/06/2018 08:57  Glucose-Capillary Latest Ref Range: 65 - 99 mg/dL 782 (H) 75 57 (L) 87   Diabetes history: Type 1 DM Outpatient Diabetes medications: Novolin 70/30 20 units BID Current orders for Inpatient glycemic control: Novolog 70/30 20 units BID, Prednisone 5 mg QD  Inpatient Diabetes Program Recommendations:    Noted hypoglycemic event this AM of 57 mg/dL. Noted that orders have been changed to Novolog 70/30 10 units BID. Would agree with adjustments and watch for additional recs.   Thanks, Lujean Rave, MSN, RNC-OB Diabetes Coordinator [phone] (8a-5p)

## 2018-01-06 NOTE — Progress Notes (Signed)
Pharmacy Antibiotic Note  [name] is a 52 y.o. female admitted on 01/04/2018 with RLE cellulitis.  She has CKD IV and hx renal transplant. Pharmacy has been consulted for daptomycin dosing in light of AKI on CKD. Also on Zosyn. Baseline SCr ~1.6-1.8, currently rising and up to 2.79.   Plan: Daptomycin 350 mg IV q24h (~4 mg/kg/day) Monitor CK at baseline and weekly - trying to add CK to morning labs Monitor clinical progress and cultures  Height:  (170.2 cm) Weight: 183 lb 6.8 oz (83.2 kg) IBW/kg (Calculated) : 61.6  Temp (24hrs), Avg:97.9 F (36.6 C), Min:97.6 F (36.4 C), Max:98.3 F (36.8 C)  Recent Labs  Lab 01/01/18 1823 01/01/18 1859 01/04/18 1341 01/04/18 1426 01/04/18 1555 01/04/18 1724 01/05/18 0517 01/06/18 1010  WBC  --  7.3 6.4  --   --  8.1 7.2 4.6  CREATININE 1.80*  --  2.41*  --   --  2.27* 2.60* 2.79*  LATICACIDVEN  --   --   --  1.54 1.18  --   --   --     Estimated Creatinine Clearance: 26.7 mL/min (A) (by C-G formula based on SCr of 2.79 mg/dL (H)).    Allergies  Allergen Reactions  . Latex Itching  . Ace Inhibitors Cough  . Adhesive [Tape] Itching and Other (See Comments)    Please use paper tape  . Lisinopril Cough    Antimicrobials this admission: Ceftriaxone 1gm x 1 Vancomycin 4/30 >> 5/2 Zosyn 4/30 >> Daptomycin 5/2 >>  4/30 BCx >> ngtd 4/30 UCx >> mult species, recollect 4/30 MRSA PCR >> neg 4/30 CDiff >> neg  Thank you for allowing pharmacy to be a part of this patient's care.  Loura Back, PharmD, BCPS Clinical Pharmacist Clinical phone for 01/06/2018 until 3p is [phone] After 3p, please call Main Rx at [phone] for assistance 01/06/2018 11:53 AM

## 2018-01-06 NOTE — Progress Notes (Signed)
PROGRESS NOTE    [name]  MGQ:[id number] DOB: March 09, 1966 DOA: 01/04/2018 PCP: Hayden Rasmussen, MD  Brief Narrative: 52 year old female with history of chronic kidney disease stage IV, history of renal transplant on immunosuppression, type 2 diabetes, chronic right leg wound presented to the emergency room with increasing swelling pain tenderness and discharge. -In addition she had diarrhea since arrival in the emergency room she was previously seen in the ED on 4/10 and treated with a course of Bactrim   Assessment & Plan:   1. Right leg wound with secondary infection/abscess -extensive subcutaneous fluid collection noted on MRI -Large R leg wound followed at the wound center for 1 month, she is immunosuppressed secondary to transplant meds, previously Rx with Doxycycline and Bactrim -will change IV vancomycin to daptomycin secondary to renal failure, continue Zosyn for now -Orthopedics consulted discussed with Dr.Duda, regarding need for I & D -ESR is 93 and CRP is 3.9 -Arterial duplex pending  2. AKI on CK D stage IV -prior history of renal transplant on chronic immunosuppressive therapy -baseline creatinine is 1.8 -I suspect acute worsening of her renal failure was secondary to recent Bactrim use -continue gentle IV fluids today -Renal dose medications and hold nephrotoxic meds -change vancomycin to daptomycin  3. Hypertension -Continue Coreg, hold amlodipine and Lasix today  4. Diarrhea -Likely secondary to recent antibiotic use -C. Difficile PCR negative  5. Status post renal Transplant -Continue Prednisone 5 mg,CellCept and Prograf -will consult renal if no improvement in kidney function  6. Type 1 diabetes mellitus -with hypoglycemia, cutdown insulin 7030 dose  DVT prophylaxis:heparin subcutaneous Code Status: full code Family Communication:no family at bedside, called and discussed with spouse yesterday Disposition Plan: pending improvement in kidney  function and surgical debridement  Consultants:      Procedures:   Antimicrobials:  Antibiotics Given (last 72 hours)    Date/Time Action Medication Dose Rate   01/04/18 1601 New Bag/Given   piperacillin-tazobactam (ZOSYN) IVPB 3.375 g 3.375 g 12.5 mL/hr   01/04/18 1602 New Bag/Given   vancomycin (VANCOCIN) 1,500 mg in sodium chloride 0.9 % 500 mL IVPB 1,500 mg 250 mL/hr   01/04/18 2329 New Bag/Given   piperacillin-tazobactam (ZOSYN) IVPB 3.375 g 3.375 g 12.5 mL/hr   01/05/18 0840 New Bag/Given   piperacillin-tazobactam (ZOSYN) IVPB 3.375 g 3.375 g 12.5 mL/hr   01/05/18 1501 New Bag/Given   vancomycin (VANCOCIN) IVPB 1000 mg/200 mL premix 1,000 mg 200 mL/hr   01/05/18 1501 New Bag/Given   piperacillin-tazobactam (ZOSYN) IVPB 3.375 g 3.375 g 12.5 mL/hr   01/06/18 0017 New Bag/Given   piperacillin-tazobactam (ZOSYN) IVPB 3.375 g 3.375 g 12.5 mL/hr   01/06/18 0941 New Bag/Given   piperacillin-tazobactam (ZOSYN) IVPB 3.375 g 3.375 g 12.5 mL/hr      Subjective: -still has diarrhea, overall feels better than yesterday  Objective: Vitals:   01/05/18 1734 01/05/18 2018 01/06/18 0627 01/06/18 1026  BP: 121/63 (!) 106/59 (!) 125/55 (!) 107/56  Pulse: 74 72 75 70  Resp: 17   20  Temp: 98.3 F (36.8 C) 97.6 F (36.4 C) 97.6 F (36.4 C) 98 F (36.7 C)  TempSrc: Oral Oral Oral Oral  SpO2: 100% 98% 100% 100%  Weight:  83.2 kg (183 lb 6.8 oz)    Height:        Intake/Output Summary (Last 24 hours) at 01/06/2018 1133 Last data filed at 01/06/2018 0640 Gross per 24 hour  Intake 1805 ml  Output 0 ml  Net 1805 ml  Filed Weights   01/05/18 1012 01/05/18 2018  Weight: 83 kg (182 lb 15.7 oz) 83.2 kg (183 lb 6.8 oz)    Examination:  Gen: Awake, Alert, Oriented X 3, no distress HEENT: PERRLA, Neck supple, no JVD Lungs: distant breath sounds, decreased at the bases CVS: RRR,No Gallops,Rubs or new Murmurs Abd: soft, Non tender, non distended, BS present Extremities: right mid  leg with quarter-sized deep ulcer with purulent base, surrounding swelling and inflammation, 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 Mood & affect appropriate.     Data Reviewed:   CBC: Recent Labs  Lab 01/01/18 1859 01/04/18 1341 01/04/18 1724 01/05/18 0517 01/06/18 1010  WBC 7.3 6.4 8.1 7.2 4.6  NEUTROABS 5.6 5.3  --   --   --   HGB 9.1* 8.8* 8.7* 8.3* 8.9*  HCT 28.5* 27.6* 27.6* 26.8* 28.5*  MCV 88.8 87.6 87.1 89.3 89.3  PLT 238 239 273 202 993   Basic Metabolic Panel: Recent Labs  Lab 01/01/18 1823 01/04/18 1341 01/04/18 1724 01/05/18 0517 01/06/18 1010  NA 134* 130*  --  135 137  K 3.9 3.7  --  3.9 3.5  CL 106 100*  --  108 110  CO2 20* 16*  --  16* 18*  GLUCOSE 388* 456*  --  285* 79  BUN 35* 36*  --  34* 35*  CREATININE 1.80* 2.41* 2.27* 2.60* 2.79*  CALCIUM 8.5* 8.7*  --  8.2* 8.6*   GFR: Estimated Creatinine Clearance: 26.7 mL/min (A) (by C-G formula based on SCr of 2.79 mg/dL (H)). Liver Function Tests: Recent Labs  Lab 01/01/18 1840 01/04/18 1341 01/05/18 0517 01/06/18 1010  AST 15 11* 19 PENDING  ALT 12* 11* 11* 155*  ALKPHOS 94 99 78 70  BILITOT 0.6 1.3* 0.9 0.6  PROT 7.1 6.8 5.7* 6.3*  ALBUMIN 2.3* 2.1* 1.8* 1.8*   No results for input(s): LIPASE, AMYLASE in the last 168 hours. No results for input(s): AMMONIA in the last 168 hours. Coagulation Profile: No results for input(s): INR, PROTIME in the last 168 hours. Cardiac Enzymes: No results for input(s): CKTOTAL, CKMB, CKMBINDEX, TROPONINI in the last 168 hours. BNP (last 3 results) No results for input(s): PROBNP in the last 8760 hours. HbA1C: No results for input(s): HGBA1C in the last 72 hours. CBG: Recent Labs  Lab 01/05/18 1135 01/05/18 1636 01/05/18 2019 01/06/18 0806 01/06/18 0857  GLUCAP 231* 184* 75 57* 87   Lipid Profile: No results for  input(s): CHOL, HDL, LDLCALC, TRIG, CHOLHDL, LDLDIRECT in the last 72 hours. Thyroid Function Tests: No results for input(s): TSH, T4TOTAL, FREET4, T3FREE, THYROIDAB in the last 72 hours. Anemia Panel: No results for input(s): VITAMINB12, FOLATE, FERRITIN, TIBC, IRON, RETICCTPCT in the last 72 hours. Urine analysis:    Component Value Date/Time   COLORURINE YELLOW 01/04/2018 1341   APPEARANCEUR CLOUDY (A) 01/04/2018 1341   LABSPEC 1.015 01/04/2018 1341   PHURINE 6.0 01/04/2018 1341   GLUCOSEU >=500 (A) 01/04/2018 1341   HGBUR LARGE (A) 01/04/2018 1341   BILIRUBINUR NEGATIVE 01/04/2018 1341   KETONESUR 20 (A) 01/04/2018 1341   PROTEINUR 100 (A) 01/04/2018 1341   UROBILINOGEN 0.2 03/02/2015 1153   NITRITE NEGATIVE 01/04/2018 1341   LEUKOCYTESUR MODERATE (A) 01/04/2018 1341   Sepsis Labs: _0 (procalcitonin:4,lacticidven:4)  ) Recent Results (from the past 240 hour(s))  Urine culture     Status:  Abnormal   Collection Time: 01/01/18  8:52 PM  Result Value Ref Range Status   Specimen Description   Final    URINE, RANDOM Performed at [facility] [address], [place], [place] [address]    Special Requests   Final    NONE Performed at [facility], [place] [address], [place], [place] [address]    Culture (A)  Final    <10,000 COLONIES/mL INSIGNIFICANT GROWTH Performed at [facility] [address], [place], [place] [address]    Report Status 01/03/2018 FINAL  Final  Blood Culture (routine x 2)     Status: None (Preliminary result)   Collection Time: 01/04/18  3:34 PM  Result Value Ref Range Status   Specimen Description BLOOD RIGHT ARM  Final   Special Requests   Final    BOTTLES DRAWN AEROBIC AND ANAEROBIC Blood Culture adequate volume   Culture   Final    NO GROWTH < 24 HOURS Performed at [facility], [place] [address] [place], [place] [address]    Report Status PENDING  Incomplete  Blood Culture (routine x 2)      Status: None (Preliminary result)   Collection Time: 01/04/18  4:00 PM  Result Value Ref Range Status   Specimen Description BLOOD LEFT WRIST  Final   Special Requests   Final    BOTTLES DRAWN AEROBIC AND ANAEROBIC Blood Culture adequate volume   Culture   Final    NO GROWTH < 24 HOURS Performed at [facility], [place] [address] [place], [place] [address]    Report Status PENDING  Incomplete  Urine culture     Status: Abnormal   Collection Time: 01/04/18  5:24 PM  Result Value Ref Range Status   Specimen Description URINE, CLEAN CATCH  Final   Special Requests   Final    NONE Performed at [facility], [place] [address], [place], [place] [address]    Culture MULTIPLE SPECIES PRESENT, SUGGEST RECOLLECTION (A)  Final   Report Status 01/05/2018 FINAL  Final  C difficile quick scan w PCR reflex     Status: None   Collection Time: 01/04/18  5:45 PM  Result Value Ref Range Status   C Diff antigen NEGATIVE NEGATIVE Final   C Diff toxin NEGATIVE NEGATIVE Final   C Diff interpretation No C. difficile detected.  Final  MRSA PCR Screening     Status: None   Collection Time: 01/04/18  7:47 PM  Result Value Ref Range Status   MRSA by PCR NEGATIVE NEGATIVE Final    Comment:        The GeneXpert MRSA Assay (FDA approved for NASAL specimens only), is one component of a comprehensive MRSA colonization surveillance program. It is not intended to diagnose MRSA infection nor to guide or monitor treatment for MRSA infections. Performed at [facility], [place] [address] [place], [place] [address]          Radiology Studies: Dg Tibia/fibula Right  Result Date: 01/04/2018 CLINICAL DATA:  Right lower leg swelling for the past 2 months. Clinical concern for gas-forming infection. EXAM: RIGHT TIBIA AND FIBULA - 2 VIEW COMPARISON:  01/01/2018. FINDINGS: Diffuse soft tissue swelling. Diffuse arterial calcifications. No soft tissue gas, bone destruction or periosteal reaction. An  anterior mid lower leg soft tissue ulceration is again demonstrated. Diffuse osteopenia. IMPRESSION: 1. Anterior soft tissue ulceration without evidence of underlying osteomyelitis. 2. No soft tissue  gas. 3. Extensive atheromatous arterial calcifications. Electronically Signed   By: Claudie Revering M.D.   On: 01/04/2018 14:47   Mr Tibia Fibula Right Wo Contrast  Result Date: 01/05/2018 CLINICAL DATA:  52 year old female with history of chronic renal disease, stage IV with renal transplant on immunosuppression, type 2 diabetes and chronic right leg wound presenting with increased pain and swelling as well as discharge. EXAM: MRI OF LOWER RIGHT EXTREMITY WITHOUT CONTRAST TECHNIQUE: Multiplanar, multisequence MR imaging of the right leg was performed. No intravenous contrast was administered. COMPARISON:  Radiographs of the right leg from 01/04/2018 FINDINGS: Bones/Joint/Cartilage Mild degenerative joint space narrowing of the included knees. No focal chondral defects. No significant joint effusion. No marrow signal abnormalities of the included tibia and fibula of either leg to suggest acute osteomyelitis. No acute fracture bone destruction. Ligaments Noncontributory Muscles and Tendons Generalized muscle atrophy and myositis of both included legs. Soft tissues Right leg: There is an approximately 2.4 x 1.6 x 0.9 cm (CC x AP x Trv) ulcer along the anterolateral aspect of the right mid leg. This is associated with a thin tracking layer of subcutaneous fluid spanning at least 10.6 x 4.4 x 0.7 cm in CC by AP by transverse, series 4/19 and series 6/30. Similar ovoid linear fluid collections are seen interposed between the gastrocnemius and soleus muscles of the right leg, the more lateral measuring at least 11.6 x 2.7 x 1.2 cm and the more medial measuring at least 11.6 x 2.1 x 1.4 cm. Left leg: Large subcutaneous tracking fluid collection measuring 22.4 x 5.8 x 2.2 cm, series 5/45 and series 4/6 overlying the  gastrocnemius muscles. Additional tracking fluid measuring 18.3 x 5.6 x 1.4 cm is seen interposed between the gastrocnemius and soleus muscles. IMPRESSION: 1. 2.4 x 1.6 x 0.9 cm ulcer involving the anterolateral aspect of the right mid leg associated with a thin tracking layer of soft cutaneous fluid measuring 10.6 x 4.4 x 0.7 cm. Two additional tracking fluid collections are seen interposed between the gastrocnemius and soleus muscles of the right leg measuring 11.6 x 2.7 x 1.2 cm laterally and 11.6 x 2.1 x 1.4 cm medially. 2. Tracking fluid collections in the subcutaneous soft tissues of the posterior left leg measuring 22.4 x 5.8 x 2.2 cm with a slightly deeper tracking fluid collection interposed between the gastrocnemius and soleus muscles measuring 18.3 x 5.6 x 1.4 cm. 3. No associated marrow signal abnormalities to indicate acute osteomyelitis of either included leg. 4. Generalized diffuse soft tissue and intramuscular edema compatible with cellulitis/third spacing of fluid and myositis of both legs. Electronically Signed   By: Ashley Royalty M.D.   On: 01/05/2018 21:27        Scheduled Meds: . carvedilol  25 mg Oral BID WC  . collagenase   Topical Daily  . heparin  5,000 Units Subcutaneous Q8H  . insulin aspart  0-5 Units Subcutaneous QHS  . insulin aspart  0-9 Units Subcutaneous TID WC  . insulin aspart protamine- aspart  10 Units Subcutaneous BID AC  . levothyroxine  137 mcg Oral Daily  . mycophenolate  500 mg Oral BID  . predniSONE  5 mg Oral Daily  . senna-docusate  1 tablet Oral QHS  . sodium bicarbonate  1,300 mg Oral BID  . tacrolimus  0.5 mg Oral QHS  . tacrolimus  1 mg Oral Daily  . vitamin B-12  100 mcg Oral Daily   Continuous Infusions: . sodium chloride 75 mL/hr at 01/05/18  7096  . piperacillin-tazobactam (ZOSYN)  IV 3.375 g (01/06/18 0941)     LOS: 2 days    Time spent: 42mn    PDomenic Polite MD [facility] Page via [email or web address], password [email or web address] After  7PM please contact night-coverage  01/06/2018, 11:33 AM

## 2018-01-07 ENCOUNTER — Inpatient Hospital Stay (HOSPITAL_COMMUNITY): Payer: Medicare Other

## 2018-01-07 DIAGNOSIS — L0390 Cellulitis, unspecified: Secondary | ICD-10-CM

## 2018-01-07 LAB — GLUCOSE, CAPILLARY
GLUCOSE-CAPILLARY: 126 mg/dL — AB (ref 65–99)
GLUCOSE-CAPILLARY: 112 mg/dL — AB (ref 65–99)
Glucose-Capillary: 40 mg/dL — CL (ref 65–99)
Glucose-Capillary: 113 mg/dL — ABNORMAL HIGH (ref 65–99)
Glucose-Capillary: 52 mg/dL — ABNORMAL LOW (ref 65–99)
Glucose-Capillary: 178 mg/dL — ABNORMAL HIGH (ref 65–99)

## 2018-01-07 LAB — BASIC METABOLIC PANEL
ANION GAP: 9 (ref 5–15)
BUN: 33 mg/dL — AB (ref 6–20)
CO2: 17 mmol/L — AB (ref 22–32)
Calcium: 8.3 mg/dL — ABNORMAL LOW (ref 8.9–10.3)
Chloride: 113 mmol/L — ABNORMAL HIGH (ref 101–111)
Creatinine, Ser: 2.86 mg/dL — ABNORMAL HIGH (ref 0.44–1.00)
GFR calc Af Amer: 21 mL/min — ABNORMAL LOW (ref >60)
GFR, EST NON AFRICAN AMERICAN: 18 mL/min — AB (ref >60)
GLUCOSE: 43 mg/dL — AB (ref 65–99)
POTASSIUM: 3.7 mmol/L (ref 3.5–5.1)
Sodium: 139 mmol/L (ref 135–145)

## 2018-01-07 LAB — CBC
HEMATOCRIT: 29 % — AB (ref 36.0–46.0)
Hemoglobin: 8.8 g/dL — ABNORMAL LOW (ref 12.0–15.0)
MCH: 27.4 pg (ref 26.0–34.0)
MCHC: 30.3 g/dL (ref 30.0–36.0)
MCV: 90.3 fL (ref 78.0–100.0)
PLATELETS: 213 10*3/uL (ref 150–400)
RBC: 3.21 MIL/uL — ABNORMAL LOW (ref 3.87–5.11)
RDW: 16.1 % — AB (ref 11.5–15.5)
WBC: 4.5 10*3/uL (ref 4.0–10.5)

## 2018-01-07 MED ORDER — DEXTROSE 50 % IV SOLN
INTRAVENOUS | Status: AC
  Administered 2018-01-07: 07:00:00
  Filled 2018-01-07: qty 50

## 2018-01-07 MED ORDER — SODIUM CHLORIDE 0.9 % IV SOLN
350.0000 mg | INTRAVENOUS | Status: DC
  Administered 2018-01-08 – 2018-01-10 (×2): 350 mg via INTRAVENOUS
  Filled 2018-01-07 (×3): qty 7

## 2018-01-07 NOTE — Progress Notes (Signed)
Physical Therapy Wound Treatment Patient Details  Name: GITEL BESTE MRN: [id number] Date of Birth: August 11, 1966  Today's Date: 01/07/2018 Time: 0347-4259 Time Calculation (min): 35 min  Subjective  Subjective: Wake me up when you start the hydrotherapy Patient and Family Stated Goals: heal wound Date of Onset: (unsure/ a couple months)  Pain Score:  Pt premedicated prior to treatment. Pt with minor c/o pain with pulsed lavage.  Wound Assessment  Pressure Injury 01/04/18 Unstageable - Full thickness tissue loss in which the base of the ulcer is covered by slough (yellow, tan, gray, green or brown) and/or eschar (tan, brown or black) in the wound bed. Yellow (Active)  Wound Image   01/06/2018 11:00 AM  Dressing Type Moist to dry;Other (Comment);Compression wrap;Gauze (Comment) 01/07/2018  1:00 PM  Dressing Clean;Dry;Intact;Changed 01/07/2018  1:00 PM  Dressing Change Frequency Daily 01/07/2018  1:00 PM  State of Healing Non-healing 01/07/2018  1:00 PM  Site / Wound Assessment Yellow;Brown 01/07/2018  1:00 PM  % Wound base Red or Granulating 0% 01/04/2018  9:03 PM  % Wound base Yellow/Fibrinous Exudate 95% 01/07/2018  1:00 PM  % Wound base Black/Eschar 10% 01/07/2018  1:00 PM  % Wound base Other/Granulation Tissue (Comment) 0% 01/04/2018  9:03 PM  Peri-wound Assessment Intact;Pink;Edema;Erythema (blanchable) 01/07/2018  1:00 PM  Wound Length (cm) 3.5 cm 01/06/2018 11:00 AM  Wound Width (cm) 3 cm 01/06/2018 11:00 AM  Wound Depth (cm) 0.8 cm 01/06/2018 11:00 AM  Wound Surface Area (cm^2) 10.5 cm^2 01/06/2018 11:00 AM  Wound Volume (cm^3) 8.4 cm^3 01/06/2018 11:00 AM  Undermining (cm) 0.5 cm 6-10 o'clock 01/07/2018  1:00 PM  Margins Unattached edges (unapproximated) 01/07/2018  1:00 PM  Drainage Amount Minimal 01/07/2018  1:00 PM  Drainage Description Serosanguineous;Odor 01/07/2018  1:00 PM  Treatment Debridement (Selective);Hydrotherapy (Pulse lavage);Packing (Saline gauze) 01/07/2018  1:00 PM     Santyl applied to wound  bed.  Hydrotherapy Pulsed lavage therapy - wound location:  R tibia  Pulsed Lavage with Suction (psi): 8 psi(8-12 psi) Pulsed Lavage with Suction - Normal Saline Used: 1000 mL Pulsed Lavage Tip: Tip with splash shield Selective Debridement Selective Debridement - Location: R tibia Selective Debridement - Tools Used: Forceps;Scissors Selective Debridement - Tissue Removed: yellow slough loosened by pulsed lavage   Wound Assessment and Plan  Wound Therapy - Assess/Plan/Recommendations Wound Therapy - Clinical Statement: Edge of wound began to have brown eschar which was removed with scissors after pulsed lavage. Able to remove moderate amount of yellow, slough from wound bed revealing undermining from 6-10 oclock with purulent discharge.  Wound Therapy - Functional Problem List: decreased healing capacity due to medication  Factors Delaying/Impairing Wound Healing: Diabetes Mellitus;Multiple medical problems;Polypharmacy Hydrotherapy Plan: Debridement;Patient/family education;Pulsatile lavage with suction Wound Therapy - Frequency: 6X / week Wound Therapy - Follow Up Recommendations: Home health RN Wound Plan: see above  Wound Therapy Goals- Improve the function of patient's integumentary system by progressing the wound(s) through the phases of wound healing (inflammation - proliferation - remodeling) by: Decrease Necrotic Tissue to: 30 Decrease Necrotic Tissue - Progress: Progressing toward goal Increase Granulation Tissue to: 70 Increase Granulation Tissue - Progress: Progressing toward goal Goals/treatment plan/discharge plan were made with and agreed upon by patient/family: Yes Time For Goal Achievement: 7 days Wound Therapy - Potential for Goals: Fair  Goals will be updated until maximal potential achieved or discharge criteria met.  Discharge criteria: when goals achieved, discharge from hospital, MD decision/surgical intervention, no progress towards goals, refusal/missing three  consecutive  treatments without notification or medical reason.  GP    Dani Gobble. Migdalia Dk PT, DPT Acute Rehabilitation  [phone] Pager [phone]   [place] 01/07/2018, 1:37 PM

## 2018-01-07 NOTE — Consult Note (Addendum)
Hospital Consult    Reason for Consult:  Non healing wound right leg  Requesting Physician:  Maryelizabeth Kaufmann MRN #:  [id number]  History of Present Illness: This is a 52 y.o. female who is diabetic came to the ER because of a worsening wound on her right leg.  She states it has been present for a couple of months, but has continued to get deeper.  She states she also has a wound on the outside of her right foot and this has been present for about 3-4 weeks.  She does not remember injuring her leg or foot.  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  The pt states that she transfers to her bed to the wheelchair and doesn't walk much because of her leg.  She can't remember if she had pain in her leg prior to using the wheelchair.  She states that she was seen at [facility] 01/01/18.  She was given abx and discharged home on Bactrim.  She presnted to the the ED at [facility] on 01/04/18.  At that time she was admitted for IV abx.  She had ABI's on 01/06/18 and found to have non compressible vessels.     She does have hx of renal transplant in 2011 at [facility] from donor killed in car accident.  Her creatinine continues to worsen and is 2.86 today. She is on gentle IV hydration and holding nephrotoxic agents.  She was on hemodialysis prior to her transplant.  She has a hx of a left brachiocephalic AVF that did not work and the had a right forearm graft placed.  She is right hand dominant.    She has been having diarrhea.  She had a negative C diff on 01/04/18.  She tells me that she has had a mini stroke in the past but can't tell me what symptoms she had.    She is on CCB and beta blocker for hypertension.  She is on chronic immunosuppressive therapy.  She is on synthroid for hypothyroidism.    She is concerned about losing her leg.  Says she has an 51 year old she needs to take care of.   Remote tobacco history.  Past Medical History:  Diagnosis Date  . Arthritis    "elbows, knees, legs, back" (09/24/2015)    . CKD (chronic kidney disease)   . Daily headache   . Depression    "years ago"  . End stage renal disease (HCC)    right arm AV graft, resolved post transplant  . Hypertension   . Hypothyroid   . 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 Kidney disease 2011  . Pneumonia ~ 2007?  [name] Type II diabetes mellitus (HCC)    Insulin dependant    Past Surgical History:  Procedure Laterality Date  . AMPUTATION Left 05/11/2014   Procedure: AMPUTATION LEFT GREAT TOE;  Surgeon: Kathryne Hitch, MD;  Location: [facility] ORS;  Service: Orthopedics;  Laterality: Left;  . AV FISTULA PLACEMENT Right    forearm  . BACK SURGERY    . CATARACT EXTRACTION W/ INTRAOCULAR LENS  IMPLANT, BILATERAL Bilateral   . CESAREAN SECTION  07/1999  . DG AV DIALYSIS GRAFT DECLOT OR    . HIP PINNING,CANNULATED Right 06/02/2017   Procedure: CANNULATED RIGHT HIP PINNING;  Surgeon: Beverely Low, MD;  Location: [facility];  Service: Orthopedics;  Laterality: Right;  . INCISION AND DRAINAGE Right 06/02/2015   Procedure: INCISION AND DRAINAGE OF  RIGHT 3rd RAY RESECTION;  Surgeon: Kathryne Hitch, MD;  [facility]: [facility];  Service: Orthopedics;  Laterality: Right;  . 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 LUMBAR DISC SURGERY  2001  . NEPHRECTOMY TRANSPLANTED ORGAN    . TOE AMPUTATION Right    1,2 & 3rd toes.  . TUBAL LIGATION  07/1999    Allergies  Allergen Reactions  . Latex Itching  . Ace Inhibitors Cough  . Adhesive [Tape] Itching and Other (See Comments)    Please use paper tape  . Lisinopril Cough    Prior to Admission medications   Medication Sig Start Date End Date Taking? Authorizing Provider  acetaminophen (TYLENOL) 325 MG tablet Take 1 tablet (325 mg total) by mouth every 6 (six) hours as needed for mild pain, moderate pain or headache. 11/12/17  Yes Albertine Grates, MD  amLODipine (NORVASC) 5 MG tablet Take 1 tablet (5 mg total) by mouth daily. 11/20/17  Yes Burnadette Pop, MD  carvedilol  (COREG) 25 MG tablet Take 1 tablet (25 mg total) by mouth 2 (two) times daily with a meal. 11/12/17  Yes Albertine Grates, MD  furosemide (LASIX) 40 MG tablet Take 40 mg by mouth daily. 12/09/17  Yes [provider]  Insulin Isophane & Regular Human (NOVOLIN 70/30 FLEXPEN RELION) (70-30) 100 UNIT/ML PEN Inject 20 Units into the skin 2 (two) times daily before a meal. PER SLIDING SCALE   Yes [provider]  lip balm (BLISTEX) OINT Apply 1 application topically as needed for lip care. 11/12/17  Yes Albertine Grates, MD  mycophenolate (CELLCEPT) 250 MG capsule Take 500 mg by mouth 2 (two) times daily.    Yes [provider]  predniSONE (DELTASONE) 5 MG tablet Take 5 mg by mouth daily.  05/29/15  Yes [provider]  senna-docusate (SENOKOT-S) 8.6-50 MG tablet Take 1 tablet by mouth at bedtime. 11/12/17  Yes Albertine Grates, MD  sodium bicarbonate 650 MG tablet Take 2 tablets (1,300 mg total) by mouth 2 (two) times daily. 09/04/17  Yes Penny Pia, MD  SYNTHROID 137 MCG tablet Take 137 mcg by mouth daily. 04/10/17  Yes [provider]  tacrolimus (PROGRAF) 0.5 MG capsule Take 0.5 mg by mouth at bedtime. 06/17/16  Yes [provider]  tacrolimus (PROGRAF) 1 MG capsule Take 1 mg by mouth daily. 11/20/17  Yes [provider]  vitamin B-12 (CYANOCOBALAMIN) 100 MCG tablet Take 1 tablet (100 mcg total) by mouth daily. 03/29/17 03/29/18 Yes Regalado, Belkys A, MD  HYDROcodone-acetaminophen (NORCO/VICODIN) 5-325 MG tablet Take 1 tablet by mouth every 6 (six) hours as needed for moderate pain. 01/01/18   Bethann Berkshire, MD  sulfamethoxazole-trimethoprim (BACTRIM DS,SEPTRA DS) 800-160 MG tablet Take 1 tablet by mouth 2 (two) times daily for 7 days. 01/01/18 01/08/18  Bethann Berkshire, MD    Social History   Socioeconomic History  . Marital status: Married    Spouse name: Not on file  . Number of children: Not on file  . Years of education: Not on file  . Highest education level: Not  on file  Occupational History  . Occupation: disabled  Social Needs  . Financial resource strain: Not on file  . Food insecurity:    Worry: Not on file    Inability: Not on file  . Transportation needs:    Medical: Not on file    Non-medical: Not on file  Tobacco Use  . Smoking status: Former Smoker    Packs/day: 1.00  Years: 11.00    Pack years: 11.00    Types: Cigarettes    Last attempt to quit: 09/21/1998    Years since quitting: 19.3  . Smokeless tobacco: Never Used  Substance and Sexual Activity  . Alcohol use: No  . Drug use: No  . Sexual activity: Never  Lifestyle  . Physical activity:    Days per week: Not on file    Minutes per session: Not on file  . Stress: Not on file  Relationships  . Social connections:    Talks on phone: Not on file    Gets together: Not on file    Attends religious service: Not on file    Active member of club or organization: Not on file    Attends meetings of clubs or organizations: Not on file    Relationship status: Not on file  . Intimate partner violence:    Fear of current or ex partner: Not on file    Emotionally abused: Not on file    Physically abused: Not on file    Forced sexual activity: Not on file  Other Topics Concern  . Not on file  Social History Narrative  . Not on file     Family History  Problem Relation Age of Onset  . Emphysema Mother   . Throat cancer Mother   . COPD Mother   . Cancer Mother   . Emphysema Father   . COPD Father   . Stroke Father   . ADD / ADHD Son     ROS:  Positive    Negative    All sytems reviewed and are negative  Cardiac:  chest pain/pressure  palpitations  SOB lying flat  DOE  Vascular:  pain in legs while walking  pain in legs at rest with wound  pain in legs at night  non-healing ulcers  swelling in legs  Pulmonary:  productive cough  asthma/wheezing  home O2  Neurologic:  weakness in  arms  legs  numbness in   arms  legs  hx of CVA  mini stroke difficulty speaking or slurred speech  temporary loss of vision in one eye  dizziness  Hematologic:  hx of cancer  bleeding problems  problems with blood clotting easily  Endocrine:    diabetes  thyroid disease  GI  diarrhea  GU:  CKD/renal failure  HD--[]  M/W/F or  T/T/S  hx kidney transplant  Psychiatric:  anxiety  depression  Musculoskeletal:  arthritis  joint pain  Integumentary:  rashes  ulcers  Constitutional:  fever  chills   Physical Examination  Vitals:   01/07/18 0443 01/07/18 0908  BP: 118/60 (!) 154/67  Pulse: 66 81  Resp: 18 16  Temp: 98.4 F (36.9 C) 97.8 F (36.6 C)  SpO2: 100% 98%   Body mass index is 28.73 kg/m.  General:  WDWN in NAD Gait: Not observed HENT: WNL, normocephalic Pulmonary: normal non-labored breathing, without Rales, rhonchi,  wheezing Cardiac: regular, without  Murmurs, rubs or gallops; without carotid bruits Abdomen:  soft, NT/ND, no masses Skin: without rashes Vascular Exam/Pulses:  Right Left  Radial 2+ (normal) 2+ (normal)  Ulnar Unable to palpate  Unable to palpate   Femoral Difficult to palpate-faintly palpable Difficulty palpatating  DP +doppler signal-monophasic biphasic  PT biphasic Not evaluated  Peroneal +doppler signal-monophasic biphasic   Extremities:       Musculoskeletal: no muscle wasting or atrophy  Neurologic: A&O X 3;  No focal weakness or  paresthesias are detected; speech is fluent/normal Psychiatric:  The pt has Normal affect.   CBC    Component Value Date/Time   WBC 4.5 01/07/2018 0309   RBC 3.21 (L) 01/07/2018 0309   HGB 8.8 (L) 01/07/2018 0309   HCT 29.0 (L) 01/07/2018 0309   PLT 213 01/07/2018 0309   MCV 90.3 01/07/2018 0309   MCH 27.4 01/07/2018 0309   MCHC 30.3 01/07/2018 0309   RDW 16.1 (H) 01/07/2018 0309   LYMPHSABS 0.7 01/04/2018 1341   MONOABS 0.3 01/04/2018 1341    EOSABS 0.0 01/04/2018 1341   BASOSABS 0.0 01/04/2018 1341    BMET    Component Value Date/Time   NA 139 01/07/2018 0309   K 3.7 01/07/2018 0309   CL 113 (H) 01/07/2018 0309   CO2 17 (L) 01/07/2018 0309   GLUCOSE 43 (LL) 01/07/2018 0309   BUN 33 (H) 01/07/2018 0309   CREATININE 2.86 (H) 01/07/2018 0309   CREATININE 1.07 10/29/2014 1005   CALCIUM 8.3 (L) 01/07/2018 0309   GFRNONAA 18 (L) 01/07/2018 0309   GFRAA 21 (L) 01/07/2018 0309    COAGS: Lab Results  Component Value Date   INR 0.95 11/14/2017   INR 0.96 11/07/2017   INR 1.07 11/05/2017     Non-Invasive Vascular Imaging:   ABI's 01/06/18:  ABI completed:  ABIs not ascertained secondary to calcified vessels.  Toe pressures are abnormal.  RIGHT    LEFT    PRESSURE WAVEFORM  PRESSURE WAVEFORM  BRACHIAL 147 T BRACHIAL 113 T  DP >254 B DP >254 M  AT   AT    PT >254 B PT >254 B  PER   PER    3rd TOE 0.18 NA 2nd TOE 0.40 NA    RIGHT LEFT  ABI Non comp Non comp     Statin:  No. Beta Blocker:  Yes.   Aspirin:  No. ACEI:  No. ARB:  No. CCB use:  Yes Other antiplatelets/anticoagulants:  Yes.   SQ heparin   ASSESSMENT/PLAN: This is a 52 y.o. female with non healing wounds right lower leg and foot with diabetes, hypertension and hx of renal transplant in 2011.    -pt with non healing wound for a couple of months with no improvement.  She may need angiogram to evaluate blood flow, however, she has hx of renal transplant with worsening creatinine now at 2.8.  May be able to use CO2.  Will d/w Dr. Darrick Penna and he will be in to see the pt today.     Doreatha Massed, PA-C [facility] [phone]  History and exam details as above.  Large pretibial and 5th metatarsal necrotic wounds.  High risk for limb loss.  I am able to feel her femoral pulses but no pop pedal and calcified vessels.  Marginal function of transplanted kidney complicates things further with risk of contrast  nephropathy with agram.  Will get duplex today to see if there is any discrete flow limiting lesion then decide on arteriogram or MRA as next step depending on duplex  Fabienne Bruns, MD [facility] [place] Office: [phone] Pager: [phone]

## 2018-01-07 NOTE — Progress Notes (Signed)
Inpatient Diabetes Program Recommendations  AACE/ADA: New Consensus Statement on Inpatient Glycemic Control (2015)  Target Ranges:  Prepandial:   less than 140 mg/dL      Peak postprandial:   less than 180 mg/dL (1-2 hours)      Critically ill patients:  140 - 180 mg/dL   Lab Results  Component Value Date   GLUCAP 113 (H) 01/07/2018   HGBA1C 8.9 (H) 11/11/2017    Review of Glycemic ControlResults for DOROTHEE, NAPIERKOWSKI (MRN [id number]) as of 01/07/2018 10:22  Ref. Range 01/06/2018 16:28 01/06/2018 20:41 01/07/2018 05:47 01/07/2018 06:50 01/07/2018 07:22  Glucose-Capillary Latest Ref Range: 65 - 99 mg/dL 75 61 (L) 40 (LL) 52 (L) 113 (H)    Diabetes history: Type 2 DM  Outpatient Diabetes medications: 70/30 20 units bid Current orders for Inpatient glycemic control:  Novolog sensitive tid with meals and HS  Inpatient Diabetes Program Recommendations:    Note that 70/30 stopped due to consistent lows.  May consider restarting 70/30 6 units bid?  Will follow.   Thanks,  Beryl Meager, RN, BC-ADM Inpatient Diabetes Coordinator Pager [phone] (8a-5p)

## 2018-01-07 NOTE — Progress Notes (Addendum)
Preliminary results by tech - Right Lower Ext. Arterial Duplex Completed. Diffuse atherosclerotic plaque throughout the right lower extremity arterial system without evidence of hemodynamically significant stenosis in the femoral and popliteal arteries. The proximal anterior tibial and peroneal arteries appear occluded with reconstitution at the distal segment. The posterior tibial artery appears patent.  Marilynne Halsted, BS, RDMS, RVT

## 2018-01-07 NOTE — Care Management Important Message (Signed)
Important Message  Patient Details  Name: [name] MRN: [id number] Date of Birth: 09-Oct-1965   Medicare Important Message Given:  Yes    [name] 01/07/2018, 4:15 PM

## 2018-01-07 NOTE — Progress Notes (Signed)
Pharmacy Antibiotic Note  [name] is a 52 y.o. female admitted on 01/04/2018 with RLE cellulitis.  She has CKD IV and hx renal transplant. Pharmacy has been consulted for daptomycin dosing in light of AKI on CKD.   The patient's renal function continues to worsen, SCr up to 2.86 today, CrCl~25-30 ml/min. Will adjust Daptomycin dose. Baseline CK 11 on 5/2.   Plan: Adjust Daptomycin to 350 mg (~4 mg/kg) IV q48 hours Continue Zosyn 3.375g IV every 8 hours (infused over 4 hours) Monitor weekly CK on Thurs Monitor clinical progress and cultures  Height:  (170.2 cm) Weight: 183 lb 6.9 oz (83.2 kg) IBW/kg (Calculated) : 61.6  Temp (24hrs), Avg:98.3 F (36.8 C), Min:98 F (36.7 C), Max:98.4 F (36.9 C)  Recent Labs  Lab 01/04/18 1341 01/04/18 1426 01/04/18 1555 01/04/18 1724 01/05/18 0517 01/06/18 1010 01/07/18 0309  WBC 6.4  --   --  8.1 7.2 4.6 4.5  CREATININE 2.41*  --   --  2.27* 2.60* 2.79* 2.86*  LATICACIDVEN  --  1.54 1.18  --   --   --   --     Estimated Creatinine Clearance: 26.1 mL/min (A) (by C-G formula based on SCr of 2.86 mg/dL (H)).    Allergies  Allergen Reactions  . Latex Itching  . Ace Inhibitors Cough  . Adhesive [Tape] Itching and Other (See Comments)    Please use paper tape  . Lisinopril Cough    Antimicrobials this admission: Ceftriaxone 1gm x 1 Vancomycin 4/30 >> 5/2 Zosyn 4/30 >> Daptomycin 5/2 >>  4/30 BCx >> ngtd 4/30 UCx >> mult species, recollect 4/30 MRSA PCR >> neg 4/30 CDiff >> neg  Thank you for allowing pharmacy to be a part of this patient's care.  Georgina Pillion, PharmD, BCPS Clinical Pharmacist Pager: [phone] Clinical phone for 01/07/2018 from 7a-3:30p: [phone] If after 3:30p, please call main pharmacy at: [phone] 01/07/2018 9:07 AM

## 2018-01-07 NOTE — Progress Notes (Signed)
PROGRESS NOTE    [name]  QBH:[id number] DOB: 01/06/1966 DOA: 01/04/2018 PCP: Hayden Rasmussen, MD  Brief Narrative: 52 year old female with history of chronic kidney disease stage IV, history of renal transplant on immunosuppression, type 2 diabetes, chronic right leg wound presented to the emergency room with increasing swelling pain tenderness and discharge. -In addition she had diarrhea since arrival in the emergency room she was previously seen in the ED on 4/10 and treated with a course of Bactrim   Assessment & Plan:   1. Right leg wound with secondary infection/abscess -extensive subcutaneous fluid collection noted on MRI -Large R leg wound followed at the wound center for 1 month, she is immunosuppressed secondary to transplant meds, previously Rx with Doxycycline and Bactrim -changed IV vancomycin to daptomycin secondary to renal failure, continue Zosyn for now -Orthopedics consulted discussed with Dr.Duda, regarding need for I & D -recommended VVS eval, vascular consulted -ESR is 93 and CRP is 3.9 -Arterial duplex abnormal  2. AKI on CK D stage IV -prior history of renal transplant on chronic immunosuppressive therapy -baseline creatinine is 1.8 -I suspect acute worsening of her renal failure was secondary to recent Bactrim use and some ATN in setting of above infection -continue gentle IV fluids today -Renal dose medications and hold nephrotoxic meds -changed vancomycin to daptomycin -d/w Dr.Deterding, will check Prograf trough, advised against holding one dose of Prograf  3. Hypertension -Continue Coreg, hold amlodipine and Lasix today  4. Diarrhea -Likely secondary to recent antibiotic use and prograf could be contributing, FU level -C. Difficile PCR negative  5. Status post renal Transplant -Continue Prednisone 5 mg,CellCept and Prograf -see above  6. Type 1 diabetes mellitus -with hypoglycemia, had cutdown insulin 7030 dose, will stop for now and  slowly restart at lower dose  DVT prophylaxis:heparin subcutaneous Code Status: full code Family Communication:no family at bedside, called and discussed with spouse yesterday Disposition Plan: pending improvement in kidney function and surgical debridement  Consultants:      Procedures:   Antimicrobials:  Antibiotics Given (last 72 hours)    Date/Time Action Medication Dose Rate   01/04/18 1601 New Bag/Given   piperacillin-tazobactam (ZOSYN) IVPB 3.375 g 3.375 g 12.5 mL/hr   01/04/18 1602 New Bag/Given   vancomycin (VANCOCIN) 1,500 mg in sodium chloride 0.9 % 500 mL IVPB 1,500 mg 250 mL/hr   01/04/18 2329 New Bag/Given   piperacillin-tazobactam (ZOSYN) IVPB 3.375 g 3.375 g 12.5 mL/hr   01/05/18 0840 New Bag/Given   piperacillin-tazobactam (ZOSYN) IVPB 3.375 g 3.375 g 12.5 mL/hr   01/05/18 1501 New Bag/Given   vancomycin (VANCOCIN) IVPB 1000 mg/200 mL premix 1,000 mg 200 mL/hr   01/05/18 1501 New Bag/Given   piperacillin-tazobactam (ZOSYN) IVPB 3.375 g 3.375 g 12.5 mL/hr   01/06/18 0017 New Bag/Given   piperacillin-tazobactam (ZOSYN) IVPB 3.375 g 3.375 g 12.5 mL/hr   01/06/18 0941 New Bag/Given   piperacillin-tazobactam (ZOSYN) IVPB 3.375 g 3.375 g 12.5 mL/hr   01/06/18 1751 New Bag/Given   piperacillin-tazobactam (ZOSYN) IVPB 3.375 g 3.375 g 12.5 mL/hr   01/06/18 1751 New Bag/Given   DAPTOmycin (CUBICIN) 350 mg in sodium chloride 0.9 % IVPB 350 mg 214 mL/hr   01/07/18 0000 New Bag/Given   piperacillin-tazobactam (ZOSYN) IVPB 3.375 g 3.375 g 12.5 mL/hr   01/07/18 0934 New Bag/Given  [medication not available]   piperacillin-tazobactam (ZOSYN) IVPB 3.375 g 3.375 g 12.5 mL/hr      Subjective: -continues to have diarrhea, leg unchanged  Objective: Vitals:  01/06/18 1026 01/06/18 2041 01/07/18 0443 01/07/18 0908  BP: (!) 107/56 125/73 118/60 (!) 154/67  Pulse: 70 67 66 81  Resp: '20 18 18 16  ' Temp: 98 F (36.7 C) 98.4 F (36.9 C) 98.4 F (36.9 C) 97.8 F (36.6 C)   TempSrc: Oral Oral  Oral  SpO2: 100% 96% 100% 98%  Weight:  83.2 kg (183 lb 6.9 oz)    Height:        Intake/Output Summary (Last 24 hours) at 01/07/2018 1054 Last data filed at 01/07/2018 0900 Gross per 24 hour  Intake 3619.5 ml  Output 600 ml  Net 3019.5 ml   Filed Weights   01/05/18 1012 01/05/18 2018 01/06/18 2041  Weight: 83 kg (182 lb 15.7 oz) 83.2 kg (183 lb 6.8 oz) 83.2 kg (183 lb 6.9 oz)    Examination:  Gen: Awake, Alert, Oriented X 3, chronically ill appearing  HEENT: PERRLA, Neck supple, no JVD Lungs: Good air movement bilaterally, CTAB CVS: RRR,No Gallops,Rubs or new Murmurs Abd: soft, Non tender, non distended, BS present extremities: right mid leg with quarter-sized deep ulcer with purulent base, surrounding swelling and inflammation, 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 Mood & affect appropriate.     Data Reviewed:   CBC: Recent Labs  Lab 01/01/18 1859 01/04/18 1341 01/04/18 1724 01/05/18 0517 01/06/18 1010 01/07/18 0309  WBC 7.3 6.4 8.1 7.2 4.6 4.5  NEUTROABS 5.6 5.3  --   --   --   --   HGB 9.1* 8.8* 8.7* 8.3* 8.9* 8.8*  HCT 28.5* 27.6* 27.6* 26.8* 28.5* 29.0*  MCV 88.8 87.6 87.1 89.3 89.3 90.3  PLT 238 239 273 202 183 681   Basic Metabolic Panel: Recent Labs  Lab 01/01/18 1823 01/04/18 1341 01/04/18 1724 01/05/18 0517 01/06/18 1010 01/07/18 0309  NA 134* 130*  --  135 137 139  K 3.9 3.7  --  3.9 3.5 3.7  CL 106 100*  --  108 110 113*  CO2 20* 16*  --  16* 18* 17*  GLUCOSE 388* 456*  --  285* 79 43*  BUN 35* 36*  --  34* 35* 33*  CREATININE 1.80* 2.41* 2.27* 2.60* 2.79* 2.86*  CALCIUM 8.5* 8.7*  --  8.2* 8.6* 8.3*   GFR: Estimated Creatinine Clearance: 26.1 mL/min (A) (by C-G formula based on SCr of 2.86 mg/dL (H)). Liver Function Tests: Recent Labs  Lab 01/01/18 1840 01/04/18 1341 01/05/18 0517  01/06/18 1010  AST 15 11* 19 349*  ALT 12* 11* 11* 155*  ALKPHOS 94 99 78 70  BILITOT 0.6 1.3* 0.9 0.6  PROT 7.1 6.8 5.7* 6.3*  ALBUMIN 2.3* 2.1* 1.8* 1.8*   No results for input(s): LIPASE, AMYLASE in the last 168 hours. No results for input(s): AMMONIA in the last 168 hours. Coagulation Profile: No results for input(s): INR, PROTIME in the last 168 hours. Cardiac Enzymes: Recent Labs  Lab 01/06/18 1010  CKTOTAL 11*   BNP (last 3 results) No results for input(s): PROBNP in the last 8760 hours. HbA1C: No results for input(s): HGBA1C in the last 72 hours. CBG: Recent Labs  Lab 01/06/18 1628 01/06/18 2041 01/07/18 0547 01/07/18 0650 01/07/18 0722  GLUCAP 75 61* 40* 52* 113*   Lipid Profile: No results for input(s): CHOL, HDL, LDLCALC, TRIG, CHOLHDL, LDLDIRECT in the last 72 hours. Thyroid Function Tests: No results for  input(s): TSH, T4TOTAL, FREET4, T3FREE, THYROIDAB in the last 72 hours. Anemia Panel: No results for input(s): VITAMINB12, FOLATE, FERRITIN, TIBC, IRON, RETICCTPCT in the last 72 hours. Urine analysis:    Component Value Date/Time   COLORURINE YELLOW 01/04/2018 1341   APPEARANCEUR CLOUDY (A) 01/04/2018 1341   LABSPEC 1.015 01/04/2018 1341   PHURINE 6.0 01/04/2018 1341   GLUCOSEU >=500 (A) 01/04/2018 1341   HGBUR LARGE (A) 01/04/2018 1341   BILIRUBINUR NEGATIVE 01/04/2018 1341   KETONESUR 20 (A) 01/04/2018 1341   PROTEINUR 100 (A) 01/04/2018 1341   UROBILINOGEN 0.2 03/02/2015 1153   NITRITE NEGATIVE 01/04/2018 1341   LEUKOCYTESUR MODERATE (A) 01/04/2018 1341   Sepsis Labs: '@LABRCNTIP' (procalcitonin:4,lacticidven:4)  ) Recent Results (from the past 240 hour(s))  Urine culture     Status: Abnormal   Collection Time: 01/01/18  8:52 PM  Result Value Ref Range Status   Specimen Description   Final    URINE, RANDOM Performed at [facility], [place] [address], [place], [place] [address]    Special Requests   Final     NONE Performed at [facility], [place] [address] [place], [place] [address]    Culture (A)  Final    <10,000 COLONIES/mL INSIGNIFICANT GROWTH Performed at [place] [address] [place], [place] [address]    Report Status 01/03/2018 FINAL  Final  Blood Culture (routine x 2)     Status: None (Preliminary result)   Collection Time: 01/04/18  3:34 PM  Result Value Ref Range Status   Specimen Description BLOOD RIGHT ARM  Final   Special Requests   Final    BOTTLES DRAWN AEROBIC AND ANAEROBIC Blood Culture adequate volume   Culture   Final    NO GROWTH 2 DAYS Performed at [facility], [place] [address], [place], [place] [address]    Report Status PENDING  Incomplete  Blood Culture (routine x 2)     Status: None (Preliminary result)   Collection Time: 01/04/18  4:00 PM  Result Value Ref Range Status   Specimen Description BLOOD LEFT WRIST  Final   Special Requests   Final    BOTTLES DRAWN AEROBIC AND ANAEROBIC Blood Culture adequate volume   Culture   Final    NO GROWTH 2 DAYS Performed at [facility], [place] [address] [place], [place] [address]    Report Status PENDING  Incomplete  Urine culture     Status: Abnormal   Collection Time: 01/04/18  5:24 PM  Result Value Ref Range Status   Specimen Description URINE, CLEAN CATCH  Final   Special Requests   Final    NONE Performed at [facility], [place] [address], [place], [place] [address]    Culture MULTIPLE SPECIES PRESENT, SUGGEST RECOLLECTION (A)  Final   Report Status 01/05/2018 FINAL  Final  C difficile quick scan w PCR reflex     Status: None   Collection Time: 01/04/18  5:45 PM  Result Value Ref Range Status   C Diff antigen NEGATIVE NEGATIVE Final   C Diff toxin NEGATIVE NEGATIVE Final   C Diff interpretation No C. difficile detected.  Final  MRSA PCR Screening     Status: None   Collection Time: 01/04/18  7:47 PM  Result Value Ref Range Status   MRSA by PCR NEGATIVE  NEGATIVE Final    Comment:        The GeneXpert MRSA Assay (FDA approved for  NASAL specimens only), is one component of a comprehensive MRSA colonization surveillance program. It is not intended to diagnose MRSA infection nor to guide or monitor treatment for MRSA infections. Performed at [facility], [place] [address], [place], [place] [address]          Radiology Studies: Mr Tibia Fibula Right Wo Contrast  Result Date: 01/05/2018 CLINICAL DATA:  52 year old female with history of chronic renal disease, stage IV with renal transplant on immunosuppression, type 2 diabetes and chronic right leg wound presenting with increased pain and swelling as well as discharge. EXAM: MRI OF LOWER RIGHT EXTREMITY WITHOUT CONTRAST TECHNIQUE: Multiplanar, multisequence MR imaging of the right leg was performed. No intravenous contrast was administered. COMPARISON:  Radiographs of the right leg from 01/04/2018 FINDINGS: Bones/Joint/Cartilage Mild degenerative joint space narrowing of the included knees. No focal chondral defects. No significant joint effusion. No marrow signal abnormalities of the included tibia and fibula of either leg to suggest acute osteomyelitis. No acute fracture bone destruction. Ligaments Noncontributory Muscles and Tendons Generalized muscle atrophy and myositis of both included legs. Soft tissues Right leg: There is an approximately 2.4 x 1.6 x 0.9 cm (CC x AP x Trv) ulcer along the anterolateral aspect of the right mid leg. This is associated with a thin tracking layer of subcutaneous fluid spanning at least 10.6 x 4.4 x 0.7 cm in CC by AP by transverse, series 4/19 and series 6/30. Similar ovoid linear fluid collections are seen interposed between the gastrocnemius and soleus muscles of the right leg, the more lateral measuring at least 11.6 x 2.7 x 1.2 cm and the more medial measuring at least 11.6 x 2.1 x 1.4 cm. Left leg: Large subcutaneous tracking fluid collection measuring  22.4 x 5.8 x 2.2 cm, series 5/45 and series 4/6 overlying the gastrocnemius muscles. Additional tracking fluid measuring 18.3 x 5.6 x 1.4 cm is seen interposed between the gastrocnemius and soleus muscles. IMPRESSION: 1. 2.4 x 1.6 x 0.9 cm ulcer involving the anterolateral aspect of the right mid leg associated with a thin tracking layer of soft cutaneous fluid measuring 10.6 x 4.4 x 0.7 cm. Two additional tracking fluid collections are seen interposed between the gastrocnemius and soleus muscles of the right leg measuring 11.6 x 2.7 x 1.2 cm laterally and 11.6 x 2.1 x 1.4 cm medially. 2. Tracking fluid collections in the subcutaneous soft tissues of the posterior left leg measuring 22.4 x 5.8 x 2.2 cm with a slightly deeper tracking fluid collection interposed between the gastrocnemius and soleus muscles measuring 18.3 x 5.6 x 1.4 cm. 3. No associated marrow signal abnormalities to indicate acute osteomyelitis of either included leg. 4. Generalized diffuse soft tissue and intramuscular edema compatible with cellulitis/third spacing of fluid and myositis of both legs. Electronically Signed   By: Ashley Royalty M.D.   On: 01/05/2018 21:27        Scheduled Meds: . carvedilol  25 mg Oral BID WC  . collagenase   Topical Daily  . heparin  5,000 Units Subcutaneous Q8H  . insulin aspart  0-5 Units Subcutaneous QHS  . insulin aspart  0-9 Units Subcutaneous TID WC  . levothyroxine  137 mcg Oral Daily  . mycophenolate  500 mg Oral BID  . predniSONE  5 mg Oral Daily  . senna-docusate  1 tablet Oral QHS  . sodium bicarbonate  1,300 mg Oral BID  . tacrolimus  0.5 mg Oral QHS  . tacrolimus  1 mg Oral Daily  .  vitamin B-12  100 mcg Oral Daily   Continuous Infusions: . sodium chloride 75 mL/hr at 01/07/18 0833  . [START ON 01/08/2018] DAPTOmycin (CUBICIN)  IV    . piperacillin-tazobactam (ZOSYN)  IV 3.375 g (01/07/18 0934)     LOS: 3 days    Time spent: 49mn    PDomenic Polite MD [facility] Page via [email or web address], password [email or web address] After 7PM please contact night-coverage  01/07/2018, 10:54 AM

## 2018-01-08 LAB — BASIC METABOLIC PANEL
Anion gap: 8 (ref 5–15)
BUN: 25 mg/dL — AB (ref 6–20)
CO2: 17 mmol/L — ABNORMAL LOW (ref 22–32)
CREATININE: 2.77 mg/dL — AB (ref 0.44–1.00)
Calcium: 8.3 mg/dL — ABNORMAL LOW (ref 8.9–10.3)
Chloride: 115 mmol/L — ABNORMAL HIGH (ref 101–111)
GFR, EST AFRICAN AMERICAN: 22 mL/min — AB (ref >60)
GFR, EST NON AFRICAN AMERICAN: 19 mL/min — AB (ref >60)
Glucose, Bld: 161 mg/dL — ABNORMAL HIGH (ref 65–99)
POTASSIUM: 4.1 mmol/L (ref 3.5–5.1)
SODIUM: 140 mmol/L (ref 135–145)

## 2018-01-08 LAB — GLUCOSE, CAPILLARY
GLUCOSE-CAPILLARY: 149 mg/dL — AB (ref 65–99)
Glucose-Capillary: 210 mg/dL — ABNORMAL HIGH (ref 65–99)
Glucose-Capillary: 143 mg/dL — ABNORMAL HIGH (ref 65–99)
Glucose-Capillary: 160 mg/dL — ABNORMAL HIGH (ref 65–99)

## 2018-01-08 NOTE — Progress Notes (Signed)
Physical Therapy Wound Treatment Patient Details  Name: NABILAH DAVOLI MRN: [id number] Date of Birth: 1966-06-15  Today's Date: 01/08/2018 Time: 1002-1040 Time Calculation (min): 38 min  Subjective  Subjective: Is this going to hurt Patient and Family Stated Goals: heal wound Date of Onset: (unsure/ a couple months)  Pain Score:  Pt premedicated prior to treatment. Pt with good tolerance of pulsed lavage. 1x evasive movement with sharps debridement.   Wound Assessment  Pressure Injury 01/04/18 Unstageable - Full thickness tissue loss in which the base of the ulcer is covered by slough (yellow, tan, gray, green or brown) and/or eschar (tan, brown or black) in the wound bed. Yellow (Active)  Wound Image   01/06/2018 11:00 AM  Dressing Type Moist to dry;Other (Comment);Compression wrap;Gauze (Comment) 01/08/2018 11:00 AM  Dressing Clean;Dry;Intact;Changed 01/08/2018 11:00 AM  Dressing Change Frequency Daily 01/08/2018 11:00 AM  State of Healing Non-healing 01/08/2018 11:00 AM  Site / Wound Assessment Yellow;Brown 01/08/2018 11:00 AM  % Wound base Red or Granulating 0% 01/04/2018  9:03 PM  % Wound base Yellow/Fibrinous Exudate 95% 01/08/2018 11:00 AM  % Wound base Black/Eschar 5% 01/08/2018 11:00 AM  % Wound base Other/Granulation Tissue (Comment) 0% 01/04/2018  9:03 PM  Peri-wound Assessment Intact;Pink;Edema;Erythema (blanchable);Denuded 01/08/2018 11:00 AM  Wound Length (cm) 3.5 cm 01/06/2018 11:00 AM  Wound Width (cm) 3 cm 01/06/2018 11:00 AM  Wound Depth (cm) 0.8 cm 01/06/2018 11:00 AM  Wound Surface Area (cm^2) 10.5 cm^2 01/06/2018 11:00 AM  Wound Volume (cm^3) 8.4 cm^3 01/06/2018 11:00 AM  Undermining (cm) 0.5 cm 6-10 o'clock 01/07/2018  1:00 PM  Margins Unattached edges (unapproximated) 01/08/2018 11:00 AM  Drainage Amount Minimal 01/08/2018 11:00 AM  Drainage Description Serosanguineous;Odor 01/08/2018 11:00 AM  Treatment Debridement (Selective);Hydrotherapy (Pulse lavage);Packing (Saline gauze) 01/08/2018 11:00 AM    Santyl applied to slough in wound bed.   Hydrotherapy Pulsed lavage therapy - wound location:  R tibia  Pulsed Lavage with Suction (psi): 8 psi(8-12 psi) Pulsed Lavage with Suction - Normal Saline Used: 1000 mL Pulsed Lavage Tip: Tip with splash shield Selective Debridement Selective Debridement - Location: R tibia Selective Debridement - Tools Used: Forceps;Scissors Selective Debridement - Tissue Removed: yellow slough loosened by pulsed lavage   Wound Assessment and Plan  Wound Therapy - Assess/Plan/Recommendations Wound Therapy - Clinical Statement: Dressing down at ankle and packing completely removed from wound on entry. After debridement of sloughy yellow material from the wound bed. Santyl appled and wound and packed with moist gauze, Kerlix wrap with Ace bandage over top in an attempt to keep dressing in place between hydrotherapy visits.  Wound Therapy - Functional Problem List: decreased healing capacity due to medication  Factors Delaying/Impairing Wound Healing: Diabetes Mellitus;Multiple medical problems;Polypharmacy Hydrotherapy Plan: Debridement;Patient/family education;Pulsatile lavage with suction Wound Therapy - Frequency: 6X / week Wound Therapy - Follow Up Recommendations: Home health RN Wound Plan: see above  Wound Therapy Goals- Improve the function of patient's integumentary system by progressing the wound(s) through the phases of wound healing (inflammation - proliferation - remodeling) by: Decrease Necrotic Tissue to: 30 Decrease Necrotic Tissue - Progress: Progressing toward goal Increase Granulation Tissue to: 70 Increase Granulation Tissue - Progress: Progressing toward goal Goals/treatment plan/discharge plan were made with and agreed upon by patient/family: Yes Time For Goal Achievement: 7 days Wound Therapy - Potential for Goals: Fair  Goals will be updated until maximal potential achieved or discharge criteria met.  Discharge criteria: when goals  achieved, discharge from hospital, MD decision/surgical intervention, no  progress towards goals, refusal/missing three consecutive treatments without notification or medical reason.  GP    Dani [place]. Migdalia Dk PT, DPT Acute Rehabilitation  [phone] Pager [phone]   [place] 01/08/2018, 11:11 AM

## 2018-01-08 NOTE — Progress Notes (Signed)
PROGRESS NOTE    [name]  WNU:[id number] DOB: 02/24/1966 DOA: 01/04/2018 PCP: Hayden Rasmussen, MD  Brief Narrative: 52 year old female with history of chronic kidney disease stage IV, history of renal transplant on immunosuppression, type 2 diabetes, chronic right leg wound presented to the emergency room with increasing swelling pain tenderness and discharge. -In addition she had diarrhea since arrival in the emergency room she was previously seen in the ED on 4/10 and treated with a course of Bactrim. -Found to have deep right leg wound with extensive subcutaneous fluid collections, and worsening renal failure   Assessment & Plan:   1. Right leg wound with secondary infection/abscess -extensive subcutaneous fluid collection noted on MRI -Large R leg wound followed at the wound center for 1 month, she is immunosuppressed secondary to transplant meds, previously Rx with Doxycycline and Bactrim -changed IV vancomycin to daptomycin secondary to renal failure, continue Zosyn for now -VVS consulted, plan for possible CO2 arteriogram on Monday -ESR is 93 and CRP is 3.9 -Arterial duplex abnormal  2. AKI on CK D stage IV -prior history of renal transplant on chronic immunosuppressive therapy -baseline creatinine is 1.8 -I suspect acute worsening of her renal failure was secondary to recent Bactrim use and some ATN in setting of above infection -continue gentle IV fluids today -Renal dose medications and hold nephrotoxic meds -changed vancomycin to daptomycin -d/w Dr.Deterding 5/3, creatinine stable, follow-up Prograf trough which was drawn 5/3 at 7 PM  3. Hypertension -Continue Coreg, hold amlodipine and Lasix today  4. Diarrhea -Likely secondary to recent antibiotic use and prograf could be contributing, FU level -C. Difficile PCR negative  5. Status post renal Transplant -Continue Prednisone 5 mg,CellCept and Prograf -see above  6. Type 1 diabetes mellitus -with  hypoglycemia, had cutdown insulin 7030 dose, Then stopped, -Continue sliding scale insulin for now  DVT prophylaxis:heparin subcutaneous Code Status: full code Family Communication:no family at bedside, called and discussed with spouse 5/2 Disposition Plan: pending improvement in kidney function and surgical eval, angio Consultants: VVS     Procedures:   Antimicrobials:  Antibiotics Given (last 72 hours)    Date/Time Action Medication Dose Rate   01/05/18 1501 New Bag/Given   vancomycin (VANCOCIN) IVPB 1000 mg/200 mL premix 1,000 mg 200 mL/hr   01/05/18 1501 New Bag/Given   piperacillin-tazobactam (ZOSYN) IVPB 3.375 g 3.375 g 12.5 mL/hr   01/06/18 0017 New Bag/Given   piperacillin-tazobactam (ZOSYN) IVPB 3.375 g 3.375 g 12.5 mL/hr   01/06/18 0941 New Bag/Given   piperacillin-tazobactam (ZOSYN) IVPB 3.375 g 3.375 g 12.5 mL/hr   01/06/18 1751 New Bag/Given   piperacillin-tazobactam (ZOSYN) IVPB 3.375 g 3.375 g 12.5 mL/hr   01/06/18 1751 New Bag/Given   DAPTOmycin (CUBICIN) 350 mg in sodium chloride 0.9 % IVPB 350 mg 214 mL/hr   01/07/18 0000 New Bag/Given   piperacillin-tazobactam (ZOSYN) IVPB 3.375 g 3.375 g 12.5 mL/hr   01/07/18 0934 New Bag/Given  [medication not available]   piperacillin-tazobactam (ZOSYN) IVPB 3.375 g 3.375 g 12.5 mL/hr   01/07/18 1524 New Bag/Given   piperacillin-tazobactam (ZOSYN) IVPB 3.375 g 3.375 g 12.5 mL/hr   01/07/18 2228 New Bag/Given   piperacillin-tazobactam (ZOSYN) IVPB 3.375 g 3.375 g 12.5 mL/hr   01/08/18 0820 New Bag/Given   piperacillin-tazobactam (ZOSYN) IVPB 3.375 g 3.375 g 12.5 mL/hr      Subjective: -feels little better however continues to have diarrhea  Objective: Vitals:   01/07/18 1740 01/07/18 2056 01/08/18 0532 01/08/18 0936  BP: [name])  145/70 (!) 154/76 (!) 158/73 (!) 168/73  Pulse: 74 76 86 79  Resp: 16   18  Temp: 97.7 F (36.5 C) 98.1 F (36.7 C) 97.9 F (36.6 C) 98.8 F (37.1 C)  TempSrc: Oral Oral Oral Oral    SpO2: 100% 100% 100% 99%  Weight:  83.1 kg (183 lb 3.2 oz)    Height:        Intake/Output Summary (Last 24 hours) at 01/08/2018 1101 Last data filed at 01/08/2018 0700 Gross per 24 hour  Intake 2172.5 ml  Output 0 ml  Net 2172.5 ml   Filed Weights   01/05/18 2018 01/06/18 2041 01/07/18 2056  Weight: 83.2 kg (183 lb 6.8 oz) 83.2 kg (183 lb 6.9 oz) 83.1 kg (183 lb 3.2 oz)    Examination:  Gen: Awake, Alert, Oriented X 3,  HEENT: PERRLA, Neck supple, no JVD Lungs: Good air movement bilaterally, CTAB CVS: RRR,No Gallops,Rubs or new Murmurs Abd: soft, Non tender, non distended, BS present Extremities: right mid leg with quarter-sized deep ulcer with purulent base, surrounding swelling and inflammation, 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, chronic healing ulcer on lateral aspect right foot Skin: As above Psychiatry: appropriate mood and affect    Data Reviewed:   CBC: Recent Labs  Lab 01/01/18 1859 01/04/18 1341 01/04/18 1724 01/05/18 0517 01/06/18 1010 01/07/18 0309  WBC 7.3 6.4 8.1 7.2 4.6 4.5  NEUTROABS 5.6 5.3  --   --   --   --   HGB 9.1* 8.8* 8.7* 8.3* 8.9* 8.8*  HCT 28.5* 27.6* 27.6* 26.8* 28.5* 29.0*  MCV 88.8 87.6 87.1 89.3 89.3 90.3  PLT 238 239 273 202 183 903   Basic Metabolic Panel: Recent Labs  Lab 01/04/18 1341 01/04/18 1724 01/05/18 0517 01/06/18 1010 01/07/18 0309 01/08/18 0732  NA 130*  --  135 137 139 140  K 3.7  --  3.9 3.5 3.7 4.1  CL 100*  --  108 110 113* 115*  CO2 16*  --  16* 18* 17* 17*  GLUCOSE 456*  --  285* 79 43* 161*  BUN 36*  --  34* 35* 33* 25*  CREATININE 2.41* 2.27* 2.60* 2.79* 2.86* 2.77*  CALCIUM 8.7*  --  8.2* 8.6* 8.3* 8.3*   GFR: Estimated Creatinine Clearance: 26.9 mL/min (A) (by C-G formula based on SCr of 2.77 mg/dL (H)). Liver Function Tests: Recent Labs  Lab 01/01/18 1840 01/04/18 1341 01/05/18 0517 01/06/18 1010  AST 15 11* 19 349*  ALT 12* 11* 11* 155*  ALKPHOS 94 99 78 70  BILITOT 0.6  1.3* 0.9 0.6  PROT 7.1 6.8 5.7* 6.3*  ALBUMIN 2.3* 2.1* 1.8* 1.8*   No results for input(s): LIPASE, AMYLASE in the last 168 hours. No results for input(s): AMMONIA in the last 168 hours. Coagulation Profile: No results for input(s): INR, PROTIME in the last 168 hours. Cardiac Enzymes: Recent Labs  Lab 01/06/18 1010  CKTOTAL 11*   BNP (last 3 results) No results for input(s): PROBNP in the last 8760 hours. HbA1C: No results for input(s): HGBA1C in the last 72 hours. CBG: Recent Labs  Lab 01/07/18 0722 01/07/18 1136 01/07/18 1710 01/07/18 2057 01/08/18 0759  GLUCAP 113* 126* 112* 178* 149*   Lipid Profile: No results for input(s): CHOL, HDL, LDLCALC, TRIG, CHOLHDL, LDLDIRECT in the last 72 hours. Thyroid Function Tests: No results for input(s): TSH, T4TOTAL, FREET4, T3FREE, THYROIDAB in the last 72 hours. Anemia Panel: No results for input(s):  VITAMINB12, FOLATE, FERRITIN, TIBC, IRON, RETICCTPCT in the last 72 hours. Urine analysis:    Component Value Date/Time   COLORURINE YELLOW 01/04/2018 1341   APPEARANCEUR CLOUDY (A) 01/04/2018 1341   LABSPEC 1.015 01/04/2018 1341   PHURINE 6.0 01/04/2018 1341   GLUCOSEU >=500 (A) 01/04/2018 1341   HGBUR LARGE (A) 01/04/2018 1341   BILIRUBINUR NEGATIVE 01/04/2018 1341   KETONESUR 20 (A) 01/04/2018 1341   PROTEINUR 100 (A) 01/04/2018 1341   UROBILINOGEN 0.2 03/02/2015 1153   NITRITE NEGATIVE 01/04/2018 1341   LEUKOCYTESUR MODERATE (A) 01/04/2018 1341   Sepsis Labs: _0 (procalcitonin:4,lacticidven:4)  ) Recent Results (from the past 240 hour(s))  Urine culture     Status: Abnormal   Collection Time: 01/01/18  8:52 PM  Result Value Ref Range Status   Specimen Description   Final    URINE, RANDOM Performed at [facility], [place] [address] [place], [place] [address]    Special Requests   Final    NONE Performed at [facility], [place] [address], [place], [place] [address]     Culture (A)  Final    <10,000 COLONIES/mL INSIGNIFICANT GROWTH Performed at [place] [address], [place], [place] [address]    Report Status 01/03/2018 FINAL  Final  Blood Culture (routine x 2)     Status: None (Preliminary result)   Collection Time: 01/04/18  3:34 PM  Result Value Ref Range Status   Specimen Description BLOOD RIGHT ARM  Final   Special Requests   Final    BOTTLES DRAWN AEROBIC AND ANAEROBIC Blood Culture adequate volume   Culture   Final    NO GROWTH 3 DAYS Performed at [facility], [place] [address], [place], [place] [address]    Report Status PENDING  Incomplete  Blood Culture (routine x 2)     Status: None (Preliminary result)   Collection Time: 01/04/18  4:00 PM  Result Value Ref Range Status   Specimen Description BLOOD LEFT WRIST  Final   Special Requests   Final    BOTTLES DRAWN AEROBIC AND ANAEROBIC Blood Culture adequate volume   Culture   Final    NO GROWTH 3 DAYS Performed at [facility], [place] [address] [place], [place] [address]    Report Status PENDING  Incomplete  Urine culture     Status: Abnormal   Collection Time: 01/04/18  5:24 PM  Result Value Ref Range Status   Specimen Description URINE, CLEAN CATCH  Final   Special Requests   Final    NONE Performed at [facility], [place] [address] [place], [place] [address]    Culture MULTIPLE SPECIES PRESENT, SUGGEST RECOLLECTION (A)  Final   Report Status 01/05/2018 FINAL  Final  C difficile quick scan w PCR reflex     Status: None   Collection Time: 01/04/18  5:45 PM  Result Value Ref Range Status   C Diff antigen NEGATIVE NEGATIVE Final   C Diff toxin NEGATIVE NEGATIVE Final   C Diff interpretation No C. difficile detected.  Final  MRSA PCR Screening     Status: None   Collection Time: 01/04/18  7:47 PM  Result Value Ref Range Status   MRSA by PCR NEGATIVE NEGATIVE Final    Comment:        The GeneXpert MRSA Assay (FDA approved for NASAL specimens only), is  one component of a comprehensive MRSA colonization surveillance program. It is not intended  to diagnose MRSA infection nor to guide or monitor treatment for MRSA infections. Performed at [facility], [place] [address], [place], [place] [address]          Radiology Studies: No results found.      Scheduled Meds: . carvedilol  25 mg Oral BID WC  . collagenase   Topical Daily  . heparin  5,000 Units Subcutaneous Q8H  . insulin aspart  0-5 Units Subcutaneous QHS  . insulin aspart  0-9 Units Subcutaneous TID WC  . levothyroxine  137 mcg Oral Daily  . mycophenolate  500 mg Oral BID  . predniSONE  5 mg Oral Daily  . senna-docusate  1 tablet Oral QHS  . sodium bicarbonate  1,300 mg Oral BID  . tacrolimus  0.5 mg Oral QHS  . tacrolimus  1 mg Oral Daily  . vitamin B-12  100 mcg Oral Daily   Continuous Infusions: . sodium chloride 75 mL/hr at 01/08/18 0952  . DAPTOmycin (CUBICIN)  IV    . piperacillin-tazobactam (ZOSYN)  IV 3.375 g (01/08/18 0820)     LOS: 4 days    Time spent: 85mn    PDomenic Polite MD [facility] Page via [email or web address], password [email or web address] After 7PM please contact night-coverage  01/08/2018, 11:01 AM

## 2018-01-08 NOTE — Consult Note (Signed)
[facility]  Subjective  - feels ok   Objective (!) 158/73 86 97.9 F (36.6 C) (Oral) 16 100%  Intake/Output Summary (Last 24 hours) at 01/08/2018 0909 Last data filed at 01/08/2018 0700 Gross per 24 hour  Intake 2172.5 ml  Output 0 ml  Net 2172.5 ml   Right leg wounds unchanged  Duplex reviewed.  Primarily tibial disease right leg  Assessment/Planning: Right lower extremity agram with possible intervention by my partner Dr Randie Heinz on Monday  Will use some CO2 but ultimately will probably require some contrast.  Discussed with pt risk of contrast nephropathy.   Continue hydration over weekend  Fabienne Bruns 01/08/2018 9:09 AM --  Laboratory Lab Results: Recent Labs    01/06/18 1010 01/07/18 0309  WBC 4.6 4.5  HGB 8.9* 8.8*  HCT 28.5* 29.0*  PLT 183 213   BMET Recent Labs    01/07/18 0309 01/08/18 0732  NA 139 140  K 3.7 4.1  CL 113* 115*  CO2 17* 17*  GLUCOSE 43* 161*  BUN 33* 25*  CREATININE 2.86* 2.77*  CALCIUM 8.3* 8.3*    COAG Lab Results  Component Value Date   INR 0.95 11/14/2017   INR 0.96 11/07/2017   INR 1.07 11/05/2017   No results found for: PTT

## 2018-01-09 LAB — GLUCOSE, CAPILLARY
GLUCOSE-CAPILLARY: 196 mg/dL — AB (ref 65–99)
GLUCOSE-CAPILLARY: 223 mg/dL — AB (ref 65–99)
Glucose-Capillary: 326 mg/dL — ABNORMAL HIGH (ref 65–99)
Glucose-Capillary: 184 mg/dL — ABNORMAL HIGH (ref 65–99)

## 2018-01-09 LAB — CULTURE, BLOOD (ROUTINE X 2)
CULTURE: NO GROWTH
Culture: NO GROWTH
SPECIAL REQUESTS: ADEQUATE
SPECIAL REQUESTS: ADEQUATE

## 2018-01-09 LAB — BASIC METABOLIC PANEL
ANION GAP: 12 (ref 5–15)
BUN: 21 mg/dL — AB (ref 6–20)
CHLORIDE: 111 mmol/L (ref 101–111)
CO2: 12 mmol/L — AB (ref 22–32)
Calcium: 8.2 mg/dL — ABNORMAL LOW (ref 8.9–10.3)
Creatinine, Ser: 2.65 mg/dL — ABNORMAL HIGH (ref 0.44–1.00)
GFR calc Af Amer: 23 mL/min — ABNORMAL LOW (ref >60)
GFR calc non Af Amer: 20 mL/min — ABNORMAL LOW (ref >60)
GLUCOSE: 340 mg/dL — AB (ref 65–99)
Potassium: 4.3 mmol/L (ref 3.5–5.1)
Sodium: 135 mmol/L (ref 135–145)

## 2018-01-09 LAB — OSMOLALITY, URINE: OSMOLALITY UR: 329 mosm/kg (ref 300–900)

## 2018-01-09 LAB — HEMOGLOBIN A1C
Hgb A1c MFr Bld: 8.4 % — ABNORMAL HIGH (ref 4.8–5.6)
MEAN PLASMA GLUCOSE: 194.38 mg/dL

## 2018-01-09 LAB — TACROLIMUS LEVEL
TACROLIMUS (FK506) - LABCORP: 5.6 ng/mL (ref 2.0–20.0)
Tacrolimus (FK506) - LabCorp: 5.6 ng/mL (ref 2.0–20.0)

## 2018-01-09 LAB — CREATININE, URINE, RANDOM: CREATININE, URINE: 31.31 mg/dL

## 2018-01-09 LAB — SODIUM, URINE, RANDOM: SODIUM UR: 97 mmol/L

## 2018-01-09 MED ORDER — INSULIN GLARGINE 100 UNIT/ML ~~LOC~~ SOLN
15.0000 [IU] | Freq: Every day | SUBCUTANEOUS | Status: DC
  Administered 2018-01-09: 15 [IU] via SUBCUTANEOUS
  Filled 2018-01-09 (×2): qty 0.15

## 2018-01-09 MED ORDER — SODIUM CHLORIDE 0.9 % IV BOLUS
1000.0000 mL | Freq: Once | INTRAVENOUS | Status: AC
  Administered 2018-01-09: 1000 mL via INTRAVENOUS

## 2018-01-09 NOTE — Progress Notes (Signed)
PROGRESS NOTE    [name]  ZOX:[id number] DOB: Dec 15, 1965 DOA: 01/04/2018 PCP: Hayden Rasmussen, MD  Brief Narrative: 52 year old female with history of chronic kidney disease stage 3, history of renal transplant on immunosuppression( cada[facility]ic renal transplant at [name] in 2011) type 2 diabetes, chronic right leg wound presented to the emergency room with increasing swelling pain tenderness and discharge. -In addition she had diarrhea since arrival in the emergency room. - she was previously seen in the ED on 4/10 and treated with a course of Doxy and then given Bactrim few days ago on next ED visit, -Found to have deep right leg wound with extensive subcutaneous fluid collections, and worsening renal failure -VVS consulting, ABI and duplex abnormal, plan for Arteriogram on Monday   Assessment & Plan:   1. Subacute Right leg wound with secondary infection/abscess -extensive subcutaneous fluid collection noted on MRI -Large R leg wound followed at the wound center for 1 month, she is immunosuppressed secondary to transplant meds, previously Rx with Doxycycline and now Bactrim -changed IV vancomycin to daptomycin secondary to renal failure, continue Zosyn for now -VVS consulted, arterial duplex is abnormal, plan for possible CO2 arteriogram on Monday, will likley need some contrast too per VVS, continue IVF -ESR is 93 and CRP is 3.9 -getting Hydrotherapy too  2. AKI on CK D stage IV -prior history of renal transplant on chronic immunosuppressive therapy -baseline creatinine is 1.8 -I suspect acute worsening of her renal failure was secondary to recent Bactrim use and some ATN in setting of above infection -continue gentle IV fluids today -Renal dose medications and hold nephrotoxic meds -changed vancomycin to daptomycin -follow-up Prograf trough which was drawn 5/3 at 1916pm -will request Renal input, due to likelihood of getting some contrast tomorrow too  3.  Hypertension -Continue Coreg, hold amlodipine and Lasix   4. Diarrhea -Likely secondary to recent antibiotic use and prograf could be contributing, FU level -C. Difficile PCR negative  5. Status post renal Transplant -Continue Prednisone 5 mg,CellCept and Prograf -FU Prograf level drawn at 1916 on 5/3, the previous value will be erroneous and not a true trough -see above  6. Type 1 diabetes mellitus -with hypoglycemia, had cutdown insulin 7030 dose, Then stopped, -Continue sliding scale insulin for now -add lantus, SSI  DVT prophylaxis:heparin subcutaneous Code Status: full code Family Communication:no family at bedside, called and discussed with spouse 5/2 and 5/5 Disposition Plan: pending improvement in kidney function and surgical eval, angio  Consultants: [facility] Nephrology     Procedures:   Antimicrobials:  Antibiotics Given (last 72 hours)    Date/Time Action Medication Dose Rate   01/06/18 1751 New Bag/Given   piperacillin-tazobactam (ZOSYN) IVPB 3.375 g 3.375 g 12.5 mL/hr   01/06/18 1751 New Bag/Given   DAPTOmycin (CUBICIN) 350 mg in sodium chloride 0.9 % IVPB 350 mg 214 mL/hr   01/07/18 0000 New Bag/Given   piperacillin-tazobactam (ZOSYN) IVPB 3.375 g 3.375 g 12.5 mL/hr   01/07/18 0934 New Bag/Given  [medication not available]   piperacillin-tazobactam (ZOSYN) IVPB 3.375 g 3.375 g 12.5 mL/hr   01/07/18 1524 New Bag/Given   piperacillin-tazobactam (ZOSYN) IVPB 3.375 g 3.375 g 12.5 mL/hr   01/07/18 2228 New Bag/Given   piperacillin-tazobactam (ZOSYN) IVPB 3.375 g 3.375 g 12.5 mL/hr   01/08/18 0820 New Bag/Given   piperacillin-tazobactam (ZOSYN) IVPB 3.375 g 3.375 g 12.5 mL/hr   01/08/18 1641 New Bag/Given   piperacillin-tazobactam (ZOSYN) IVPB 3.375 g 3.375 g 12.5 mL/hr   01/08/18 1723  New Bag/Given   DAPTOmycin (CUBICIN) 350 mg in sodium chloride 0.9 % IVPB 350 mg 214 mL/hr   01/08/18 2343 New Bag/Given   piperacillin-tazobactam (ZOSYN) IVPB 3.375 g 3.375 g  12.5 mL/hr   01/09/18 0805 New Bag/Given   piperacillin-tazobactam (ZOSYN) IVPB 3.375 g 3.375 g 12.5 mL/hr      Subjective: -diarrhea  A bit better, continues to have leg pain  Objective: Vitals:   01/08/18 1642 01/08/18 2103 01/09/18 0541 01/09/18 0944  BP: (!) 153/75 (!) 157/67 (!) 164/108 (!) 171/76  Pulse: 73 79 90 81  Resp: _0 Temp: 98.5 F (36.9 C) 98 F (36.7 C) 98.4 F (36.9 C) 98.1 F (36.7 C)  TempSrc: Oral Oral Oral Oral  SpO2: 97% 98% 98% 97%  Weight:  74.7 kg (164 lb 10.9 oz)    Height:        Intake/Output Summary (Last 24 hours) at 01/09/2018 1007 Last data filed at 01/09/2018 3545 Gross per 24 hour  Intake 2515 ml  Output 701 ml  Net 1814 ml   Filed Weights   01/06/18 2041 01/07/18 2056 01/08/18 2103  Weight: 83.2 kg (183 lb 6.9 oz) 83.1 kg (183 lb 3.2 oz) 74.7 kg (164 lb 10.9 oz)    Examination:  Gen: Awake, Alert, Oriented X 3,  HEENT: PERRLA, Neck supple, no JVD Lungs: Good air movement bilaterally, CTAB CVS: RRR,No Gallops,Rubs or new Murmurs Abd: soft, Non tender, non distended, BS present Extremities:right mid leg with quarter-sized deep ulcer with purulent base, surrounding swelling and inflammation, 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, chronic healing ulcer on lateral aspect right foot Skin: As above Psychiatry appropriate mood and affect    Data Reviewed:   CBC: Recent Labs  Lab 01/04/18 1341 01/04/18 1724 01/05/18 0517 01/06/18 1010 01/07/18 0309  WBC 6.4 8.1 7.2 4.6 4.5  NEUTROABS 5.3  --   --   --   --   HGB 8.8* 8.7* 8.3* 8.9* 8.8*  HCT 27.6* 27.6* 26.8* 28.5* 29.0*  MCV 87.6 87.1 89.3 89.3 90.3  PLT 239 273 202 183 625   Basic Metabolic Panel: Recent Labs  Lab 01/05/18 0517 01/06/18 1010 01/07/18 0309 01/08/18 0732 01/09/18 0755  NA 135 137 139 140 135  K 3.9 3.5 3.7 4.1 4.3  CL 108 110 113* 115* 111  CO2 16* 18* 17* 17* 12*  GLUCOSE 285* 79 43* 161* 340*  BUN 34* 35* 33* 25* 21*   CREATININE 2.60* 2.79* 2.86* 2.77* 2.65*  CALCIUM 8.2* 8.6* 8.3* 8.3* 8.2*   GFR: Estimated Creatinine Clearance: 26.8 mL/min (A) (by C-G formula based on SCr of 2.65 mg/dL (H)). Liver Function Tests: Recent Labs  Lab 01/04/18 1341 01/05/18 0517 01/06/18 1010  AST 11* 19 349*  ALT 11* 11* 155*  ALKPHOS 99 78 70  BILITOT 1.3* 0.9 0.6  PROT 6.8 5.7* 6.3*  ALBUMIN 2.1* 1.8* 1.8*   No results for input(s): LIPASE, AMYLASE in the last 168 hours. No results for input(s): AMMONIA in the last 168 hours. Coagulation Profile: No results for input(s): INR, PROTIME in the last 168 hours. Cardiac Enzymes: Recent Labs  Lab 01/06/18 1010  CKTOTAL 11*   BNP (last 3 results) No results for input(s): PROBNP in the last 8760 hours. HbA1C: No results for input(s): HGBA1C in the last 72 hours. CBG: Recent Labs  Lab 01/08/18 0759 01/08/18 1144 01/08/18 1641 01/08/18 2100 01/09/18 0735  GLUCAP 149* 210* 143* 160* 326*  Lipid Profile: No results for input(s): CHOL, HDL, LDLCALC, TRIG, CHOLHDL, LDLDIRECT in the last 72 hours. Thyroid Function Tests: No results for input(s): TSH, T4TOTAL, FREET4, T3FREE, THYROIDAB in the last 72 hours. Anemia Panel: No results for input(s): VITAMINB12, FOLATE, FERRITIN, TIBC, IRON, RETICCTPCT in the last 72 hours. Urine analysis:    Component Value Date/Time   COLORURINE YELLOW 01/04/2018 1341   APPEARANCEUR CLOUDY (A) 01/04/2018 1341   LABSPEC 1.015 01/04/2018 1341   PHURINE 6.0 01/04/2018 1341   GLUCOSEU >=500 (A) 01/04/2018 1341   HGBUR LARGE (A) 01/04/2018 1341   BILIRUBINUR NEGATIVE 01/04/2018 1341   KETONESUR 20 (A) 01/04/2018 1341   PROTEINUR 100 (A) 01/04/2018 1341   UROBILINOGEN 0.2 03/02/2015 1153   NITRITE NEGATIVE 01/04/2018 1341   LEUKOCYTESUR MODERATE (A) 01/04/2018 1341   Sepsis Labs: _0 (procalcitonin:4,lacticidven:4)  ) Recent Results (from the past 240 hour(s))  Urine culture     Status: Abnormal   Collection  Time: 01/01/18  8:52 PM  Result Value Ref Range Status   Specimen Description   Final    URINE, RANDOM Performed at [facility], [place] [address], [place], [place] [address]    Special Requests   Final    NONE Performed at [facility], [place] [address] [place], [place] [address]    Culture (A)  Final    <10,000 COLONIES/mL INSIGNIFICANT GROWTH Performed at [place] [address] [place], [place] [address]    Report Status 01/03/2018 FINAL  Final  Blood Culture (routine x 2)     Status: None (Preliminary result)   Collection Time: 01/04/18  3:34 PM  Result Value Ref Range Status   Specimen Description BLOOD RIGHT ARM  Final   Special Requests   Final    BOTTLES DRAWN AEROBIC AND ANAEROBIC Blood Culture adequate volume   Culture   Final    NO GROWTH 4 DAYS Performed at [facility], [place] [address] [place], [place] [address]    Report Status PENDING  Incomplete  Blood Culture (routine x 2)     Status: None (Preliminary result)   Collection Time: 01/04/18  4:00 PM  Result Value Ref Range Status   Specimen Description BLOOD LEFT WRIST  Final   Special Requests   Final    BOTTLES DRAWN AEROBIC AND ANAEROBIC Blood Culture adequate volume   Culture   Final    NO GROWTH 4 DAYS Performed at [facility], [place] [address], [place], [place] [address]    Report Status PENDING  Incomplete  Urine culture     Status: Abnormal   Collection Time: 01/04/18  5:24 PM  Result Value Ref Range Status   Specimen Description URINE, CLEAN CATCH  Final   Special Requests   Final    NONE Performed at [facility], [place] [address], [place], [place] [address]    Culture MULTIPLE SPECIES PRESENT, SUGGEST RECOLLECTION (A)  Final   Report Status 01/05/2018 FINAL  Final  C difficile quick scan w PCR reflex     Status: None   Collection Time: 01/04/18  5:45 PM  Result Value Ref Range Status   C Diff antigen NEGATIVE NEGATIVE Final   C  Diff toxin NEGATIVE NEGATIVE Final   C Diff interpretation No C. difficile detected.  Final  MRSA PCR Screening     Status: None   Collection Time: 01/04/18  7:47 PM  Result Value Ref Range Status   MRSA  by PCR NEGATIVE NEGATIVE Final    Comment:        The GeneXpert MRSA Assay (FDA approved for NASAL specimens only), is one component of a comprehensive MRSA colonization surveillance program. It is not intended to diagnose MRSA infection nor to guide or monitor treatment for MRSA infections. Performed at [facility], [place] [address], [place], [place] [address]          Radiology Studies: No results found.      Scheduled Meds: . carvedilol  25 mg Oral BID WC  . collagenase   Topical Daily  . heparin  5,000 Units Subcutaneous Q8H  . insulin aspart  0-5 Units Subcutaneous QHS  . insulin aspart  0-9 Units Subcutaneous TID WC  . levothyroxine  137 mcg Oral Daily  . mycophenolate  500 mg Oral BID  . predniSONE  5 mg Oral Daily  . senna-docusate  1 tablet Oral QHS  . sodium bicarbonate  1,300 mg Oral BID  . tacrolimus  0.5 mg Oral QHS  . tacrolimus  1 mg Oral Daily  . vitamin B-12  100 mcg Oral Daily   Continuous Infusions: . sodium chloride 75 mL/hr at 01/08/18 0952  . DAPTOmycin (CUBICIN)  IV Stopped (01/08/18 1820)  . piperacillin-tazobactam (ZOSYN)  IV 3.375 g (01/09/18 0805)     LOS: 5 days    Time spent: 51mn    PDomenic Polite MD [facility] Page via [email or web address], password [email or web address] After 7PM please contact night-coverage  01/09/2018, 10:07 AM

## 2018-01-09 NOTE — Consult Note (Addendum)
[facility] Consult Note     Date: 01/09/2018                  Patient Name:  [name]  MRN: [id number]  DOB: 1965-10-26  Age / Sex: 52 y.o., female         PCP: Hayden Rasmussen, MD                 Service Requesting Consult: [facility] Dr. Jacinta Shoe                 Reason for Consult: AKI            Chief Complaint: R leg wound  HPI: 52 yo female with PMH significant of CKD with previous renal transplant in 2011 on chronic suppressive therapy, HTN, DM, hypothyroidism presents with a chronic nonhealing R leg wound. She states that has had R leg wound for the 1.75month. She was seen in the ED for this wound multiple times; treated with doxycycline on 12/15/17, bactrim on 01/01/18, and initially IV vancomycin this admission but subsequently changed to daptomycin after acute worsening of creatinine. Her SCr was at baseline 1.8 on 01/01/18, now up to 2.6 this admission. She endorses good UOP without dysuria or hematuria. She states has been compliant on her transplant immunosuppressive medications.  Past Medical History:  Diagnosis Date  . Arthritis    "elbows, knees, legs, back" (09/24/2015)  . CKD (chronic kidney disease)   . Daily headache   . Depression    "years ago"  . End stage renal disease (HCC)    right arm AV graft, resolved post transplant  . Hypertension   . Hypothyroid   . 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 Kidney disease 2011  . Pneumonia ~ 2007?  .Marland KitchenType II diabetes mellitus (HCC)    Insulin dependant    Past Surgical History:  Procedure Laterality Date  . AMPUTATION Left 05/11/2014   Procedure: AMPUTATION LEFT GREAT TOE;  Surgeon: CMcarthur Rossetti MD;  Location: [facility];  Service: Orthopedics;  Laterality: Left;  . AV FISTULA PLACEMENT Right    forearm  . BACK SURGERY    . CATARACT EXTRACTION W/ INTRAOCULAR LENS  IMPLANT, BILATERAL Bilateral   . CESAREAN SECTION  07/1999  . DG AV DIALYSIS GRAFT  DECLOT OR    . HIP PINNING,CANNULATED Right 06/02/2017   Procedure: CANNULATED RIGHT HIP PINNING;  Surgeon: NNetta Cedars MD;  Location: [facility];  Service: Orthopedics;  Laterality: Right;  . INCISION AND DRAINAGE Right 06/02/2015   Procedure: INCISION AND DRAINAGE OF RIGHT 3rd RAY RESECTION;  Surgeon: CMcarthur Rossetti MD;  [facility]  Service: Orthopedics;  Laterality: Right;  . 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 [facility]  [place]  . NEPHRECTOMY TRANSPLANTED ORGAN    . TOE AMPUTATION Right    1,2 & 3rd toes.  . TUBAL LIGATION  07/1999    Family History  Problem Relation Age of Onset  . Emphysema Mother   . Throat cancer Mother   . COPD Mother   . Cancer Mother   . Emphysema Father   . COPD Father   . Stroke Father   . ADD / ADHD Son    Social History:  reports that she quit smoking about 19 years ago. Her smoking use included cigarettes. She has a 11.00 pack-year smoking history. She has never used smokeless tobacco. She reports that she does  not drink alcohol or use drugs.  Allergies:  Allergies  Allergen Reactions  . Latex Itching  . Ace Inhibitors Cough  . Adhesive [Tape] Itching and Other (See Comments)    Please use paper tape  . Lisinopril Cough    Medications Prior to Admission  Medication Sig Dispense Refill  . acetaminophen (TYLENOL) 325 MG tablet Take 1 tablet (325 mg total) by mouth every 6 (six) hours as needed for mild pain, moderate pain or headache.    [name] amLODipine (NORVASC) 5 MG tablet Take 1 tablet (5 mg total) by mouth daily. 30 tablet 0  . carvedilol (COREG) 25 MG tablet Take 1 tablet (25 mg total) by mouth 2 (two) times daily with a meal. 60 tablet 0  . furosemide (LASIX) 40 MG tablet Take 40 mg by mouth daily.  6  . Insulin Isophane & Regular Human (NOVOLIN 70/30 FLEXPEN RELION) (70-30) 100 UNIT/ML PEN Inject 20 Units into the skin 2 (two) times daily before a meal. PER SLIDING SCALE    . lip balm (BLISTEX) OINT Apply 1  application topically as needed for lip care. 1 Tube 0  . mycophenolate (CELLCEPT) 250 MG capsule Take 500 mg by mouth 2 (two) times daily.     . predniSONE (DELTASONE) 5 MG tablet Take 5 mg by mouth daily.   6  . senna-docusate (SENOKOT-S) 8.6-50 MG tablet Take 1 tablet by mouth at bedtime. 30 tablet 0  . sodium bicarbonate 650 MG tablet Take 2 tablets (1,300 mg total) by mouth 2 (two) times daily. 30 tablet 0  . SYNTHROID 137 MCG tablet Take 137 mcg by mouth daily.  5  . tacrolimus (PROGRAF) 0.5 MG capsule Take 0.5 mg by mouth at bedtime.    . tacrolimus (PROGRAF) 1 MG capsule Take 1 mg by mouth daily.    . vitamin B-12 (CYANOCOBALAMIN) 100 MCG tablet Take 1 tablet (100 mcg total) by mouth daily. 30 tablet 0  . HYDROcodone-acetaminophen (NORCO/VICODIN) 5-325 MG tablet Take 1 tablet by mouth every 6 (six) hours as needed for moderate pain. 20 tablet 0  . [EXPIRED] sulfamethoxazole-trimethoprim (BACTRIM DS,SEPTRA DS) 800-160 MG tablet Take 1 tablet by mouth 2 (two) times daily for 7 days. 14 tablet 0    Results for orders placed or performed during the hospital encounter of 01/04/18 (from the past 48 hour(s))  Glucose, capillary     Status: Abnormal   Collection Time: 01/07/18  5:10 PM  Result Value Ref Range   Glucose-Capillary 112 (H) 65 - 99 mg/dL  Glucose, capillary     Status: Abnormal   Collection Time: 01/07/18  8:57 PM  Result Value Ref Range   Glucose-Capillary 178 (H) 65 - 99 mg/dL  Basic metabolic panel     Status: Abnormal   Collection Time: 01/08/18  7:32 AM  Result Value Ref Range   Sodium 140 135 - 145 mmol/L   Potassium 4.1 3.5 - 5.1 mmol/L   Chloride 115 (H) 101 - 111 mmol/L   CO2 17 (L) 22 - 32 mmol/L   Glucose, Bld 161 (H) 65 - 99 mg/dL   BUN 25 (H) 6 - 20 mg/dL   Creatinine, Ser 2.77 (H) 0.44 - 1.00 mg/dL   Calcium 8.3 (L) 8.9 - 10.3 mg/dL   GFR calc non Af Amer 19 (L) >60 mL/min   GFR calc Af Amer 22 (L) >60 mL/min    Comment: (NOTE) The eGFR has been  calculated using the CKD EPI equation. This calculation has  not been validated in all clinical situations. eGFR's persistently <60 mL/min signify possible Chronic Kidney Disease.    Anion gap 8 5 - 15    Comment: Performed at [facility] [address], [place], [place] [address]  Glucose, capillary     Status: Abnormal   Collection Time: 01/08/18  7:59 AM  Result Value Ref Range   Glucose-Capillary 149 (H) 65 - 99 mg/dL  Glucose, capillary     Status: Abnormal   Collection Time: 01/08/18 11:44 AM  Result Value Ref Range   Glucose-Capillary 210 (H) 65 - 99 mg/dL  Glucose, capillary     Status: Abnormal   Collection Time: 01/08/18  4:41 PM  Result Value Ref Range   Glucose-Capillary 143 (H) 65 - 99 mg/dL  Glucose, capillary     Status: Abnormal   Collection Time: 01/08/18  9:00 PM  Result Value Ref Range   Glucose-Capillary 160 (H) 65 - 99 mg/dL  Glucose, capillary     Status: Abnormal   Collection Time: 01/09/18  7:35 AM  Result Value Ref Range   Glucose-Capillary 326 (H) 65 - 99 mg/dL  Basic metabolic panel     Status: Abnormal   Collection Time: 01/09/18  7:55 AM  Result Value Ref Range   Sodium 135 135 - 145 mmol/L   Potassium 4.3 3.5 - 5.1 mmol/L   Chloride 111 101 - 111 mmol/L   CO2 12 (L) 22 - 32 mmol/L   Glucose, Bld 340 (H) 65 - 99 mg/dL   BUN 21 (H) 6 - 20 mg/dL   Creatinine, Ser 2.65 (H) 0.44 - 1.00 mg/dL   Calcium 8.2 (L) 8.9 - 10.3 mg/dL   GFR calc non Af Amer 20 (L) >60 mL/min   GFR calc Af Amer 23 (L) >60 mL/min    Comment: (NOTE) The eGFR has been calculated using the CKD EPI equation. This calculation has not been validated in all clinical situations. eGFR's persistently <60 mL/min signify possible Chronic Kidney Disease.    Anion gap 12 5 - 15    Comment: Performed at [facility] [address] [place], [place] [address]   No results found.  Review of Systems  Constitutional: Positive for chills and fever.  Respiratory: Negative  for cough, shortness of breath and wheezing.   Cardiovascular: Negative for chest pain, palpitations and leg swelling.  Gastrointestinal: Positive for diarrhea. Negative for abdominal pain, blood in stool, melena, nausea and vomiting.  Genitourinary: Negative for dysuria, frequency, hematuria and urgency.  Skin: Negative for rash.       Wound on R shin  Neurological: Negative for dizziness and headaches.  Endo/Heme/Allergies: Does not bruise/bleed easily.  All other systems reviewed and are negative.   Blood pressure (!) 171/76, pulse 81, temperature 98.1 F (36.7 C), temperature source Oral, resp. rate 18, height _0  (1.702 m), weight 164 lb 10.9 oz (74.7 kg), last menstrual period 11/19/2011, SpO2 97 %. Physical Exam  Constitutional: She appears well-developed and well-nourished. No distress.  HENT:  Head: Normocephalic and atraumatic.  Mouth/Throat: Oropharynx is clear and moist.  Eyes: Conjunctivae are normal.  Neck: Normal range of motion. Neck supple. No JVD present.  Cardiovascular: Normal rate, regular rhythm and normal heart sounds.  No murmur heard. Respiratory: Effort normal and breath sounds normal.  GI: Soft. Bowel sounds are normal. She exhibits no distension. There is no tenderness. There is no rebound and no guarding.  Musculoskeletal: She exhibits no edema.  R  shin with small tibial wound, packing in place. No purulent drainage. hemosiderin skin darkening on bilateral shins      Assessment/Plan 52 yo female with PMH significant of CKD with previous renal transplant in 2011 on chronic suppressive therapy, HTN, DM, hypothyroidism presented with a subacute nonhealing R leg wound and now with acute kidney injury with worsening serum creatinine up to 2.7 from baseline 1.8.  1. AKI on CKD IV in the setting of h/o renal transplant. Likely multifactorial from recent bactrim and vancomycin use and patient appears slightly hypovolemic. SCr up to 2.6 from baseline 1.8. Urine  Na, Cr, osmolality pending. NS 1L bolus and increase MIVF to 100cc/hr for volume expansion. Per scheduled for possible intervention on Monday, although minimal dye in CO2 arteriogram would wait additional 24-48h before proceeding as would be high risk for contrast nephropathy.  2. H/o renal transplant. Continue prednisone, cellcept, prograf.  3. Subacute R leg wound with secondary infection/abscess. Switched from vancomycin to daptomycin per primary. 4.  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 Per [facility], scheduled for R LE angiogram, will discuss possibility of delaying procedure.  5. HTN, elevated BPs. Continue coreg. Amlodipine held per primary. Consider restart. 6. Anemia,  Hgb 8.8 with baseline ~9. Stable. Monitor 7. Diabetes per primary 8. Hypothyroidism   Bufford Lope, DO PGY-2, [facility] 01/09/2018 11:53 AM   Pt seen, examined and agree w A/P as above.  Kelly Splinter MD [employer] pager [phone]   01/09/2018, 2:16 PM

## 2018-01-10 ENCOUNTER — Encounter (HOSPITAL_COMMUNITY)
Admission: EM | Disposition: A | Discharge status: Home Health | Payer: Self-pay | Source: Home / Self Care | Attending: Internal Medicine

## 2018-01-10 HISTORY — PX: ABDOMINAL AORTOGRAM W/LOWER EXTREMITY: CATH118223

## 2018-01-10 LAB — BASIC METABOLIC PANEL
Anion gap: 8 (ref 5–15)
BUN: 20 mg/dL (ref 6–20)
CALCIUM: 8 mg/dL — AB (ref 8.9–10.3)
CO2: 15 mmol/L — ABNORMAL LOW (ref 22–32)
CREATININE: 2.44 mg/dL — AB (ref 0.44–1.00)
Chloride: 113 mmol/L — ABNORMAL HIGH (ref 101–111)
GFR calc Af Amer: 25 mL/min — ABNORMAL LOW (ref >60)
GFR calc non Af Amer: 22 mL/min — ABNORMAL LOW (ref >60)
Glucose, Bld: 107 mg/dL — ABNORMAL HIGH (ref 65–99)
Potassium: 3.8 mmol/L (ref 3.5–5.1)
SODIUM: 136 mmol/L (ref 135–145)

## 2018-01-10 LAB — GLUCOSE, CAPILLARY
GLUCOSE-CAPILLARY: 122 mg/dL — AB (ref 65–99)
Glucose-Capillary: 59 mg/dL — ABNORMAL LOW (ref 65–99)
Glucose-Capillary: 100 mg/dL — ABNORMAL HIGH (ref 65–99)
Glucose-Capillary: 95 mg/dL (ref 65–99)
Glucose-Capillary: 135 mg/dL — ABNORMAL HIGH (ref 65–99)
Glucose-Capillary: 203 mg/dL — ABNORMAL HIGH (ref 65–99)

## 2018-01-10 LAB — CBC
HEMATOCRIT: 27.9 % — AB (ref 36.0–46.0)
HEMOGLOBIN: 8.9 g/dL — AB (ref 12.0–15.0)
MCH: 28.3 pg (ref 26.0–34.0)
MCHC: 31.9 g/dL (ref 30.0–36.0)
MCV: 88.6 fL (ref 78.0–100.0)
Platelets: 135 10*3/uL — ABNORMAL LOW (ref 150–400)
RBC: 3.15 MIL/uL — ABNORMAL LOW (ref 3.87–5.11)
RDW: 16.2 % — AB (ref 11.5–15.5)
WBC: 4.9 10*3/uL (ref 4.0–10.5)

## 2018-01-10 LAB — PROTIME-INR
INR: 1.41
Prothrombin Time: 17.2 seconds — ABNORMAL HIGH (ref 11.4–15.2)

## 2018-01-10 SURGERY — ABDOMINAL AORTOGRAM W/LOWER EXTREMITY
Anesthesia: LOCAL

## 2018-01-10 MED ORDER — MIDAZOLAM HCL 2 MG/2ML IJ SOLN
INTRAMUSCULAR | Status: DC | PRN
  Administered 2018-01-10 (×2): 1 mg via INTRAVENOUS

## 2018-01-10 MED ORDER — HEPARIN (PORCINE) IN NACL 2-0.9 UNITS/ML
INTRAMUSCULAR | Status: AC | PRN
  Administered 2018-01-10 (×2): 500 mL

## 2018-01-10 MED ORDER — LABETALOL HCL 5 MG/ML IV SOLN
INTRAVENOUS | Status: AC
  Filled 2018-01-10: qty 4

## 2018-01-10 MED ORDER — SODIUM CHLORIDE 0.9% FLUSH: 3.0000 mL | INTRAVENOUS | Status: DC | PRN

## 2018-01-10 MED ORDER — FENTANYL CITRATE (PF) 100 MCG/2ML IJ SOLN
INTRAMUSCULAR | Status: DC | PRN
  Administered 2018-01-10 (×2): 50 ug via INTRAVENOUS

## 2018-01-10 MED ORDER — LIDOCAINE HCL (PF) 1 % IJ SOLN
INTRAMUSCULAR | Status: DC | PRN
  Administered 2018-01-10: 20 mL via INTRADERMAL

## 2018-01-10 MED ORDER — DEXTROSE 50 % IV SOLN
INTRAVENOUS | Status: AC
  Administered 2018-01-10: 50 mL
  Filled 2018-01-10: qty 50

## 2018-01-10 MED ORDER — LABETALOL HCL 5 MG/ML IV SOLN
10.0000 mg | INTRAVENOUS | Status: DC | PRN
  Administered 2018-01-10 (×2): 10 mg via INTRAVENOUS
  Filled 2018-01-10: qty 4

## 2018-01-10 MED ORDER — SODIUM CHLORIDE 0.9% FLUSH
3.0000 mL | Freq: Two times a day (BID) | INTRAVENOUS | Status: DC
  Administered 2018-01-10 – 2018-01-11 (×2): 3 mL via INTRAVENOUS

## 2018-01-10 MED ORDER — SODIUM CHLORIDE 0.9 % IV SOLN: 250.0000 mL | INTRAVENOUS | Status: DC | PRN

## 2018-01-10 MED ORDER — HYDROCODONE-ACETAMINOPHEN 5-325 MG PO TABS
ORAL_TABLET | ORAL | Status: AC
  Filled 2018-01-10: qty 1

## 2018-01-10 MED ORDER — MIDAZOLAM HCL 2 MG/2ML IJ SOLN
INTRAMUSCULAR | Status: AC
  Filled 2018-01-10: qty 2

## 2018-01-10 MED ORDER — ONDANSETRON HCL 4 MG/2ML IJ SOLN: 4.0000 mg | Freq: Four times a day (QID) | INTRAMUSCULAR | Status: DC | PRN

## 2018-01-10 MED ORDER — HYDRALAZINE HCL 20 MG/ML IJ SOLN: 5.0000 mg | INTRAMUSCULAR | Status: DC | PRN

## 2018-01-10 MED ORDER — SODIUM CHLORIDE 0.9 % WEIGHT BASED INFUSION
1.0000 mL/kg/h | INTRAVENOUS | Status: AC
  Administered 2018-01-10: 1 mL/kg/h via INTRAVENOUS

## 2018-01-10 MED ORDER — FENTANYL CITRATE (PF) 100 MCG/2ML IJ SOLN
INTRAMUSCULAR | Status: AC
  Filled 2018-01-10: qty 2

## 2018-01-10 MED ORDER — IODIXANOL 320 MG/ML IV SOLN
INTRAVENOUS | Status: DC | PRN
  Administered 2018-01-10: 22 mL via INTRA_ARTERIAL

## 2018-01-10 MED ORDER — ACETAMINOPHEN 325 MG PO TABS: 650.0000 mg | ORAL_TABLET | ORAL | Status: DC | PRN

## 2018-01-10 MED ORDER — INSULIN GLARGINE 100 UNIT/ML ~~LOC~~ SOLN
10.0000 [IU] | Freq: Every day | SUBCUTANEOUS | Status: DC
  Administered 2018-01-11: 10 [IU] via SUBCUTANEOUS
  Filled 2018-01-10: qty 0.1

## 2018-01-10 MED ORDER — OXYCODONE HCL 5 MG PO TABS
5.0000 mg | ORAL_TABLET | ORAL | Status: DC | PRN
  Administered 2018-01-10 – 2018-01-11 (×2): 10 mg via ORAL
  Filled 2018-01-10 (×2): qty 2

## 2018-01-10 MED ORDER — LIDOCAINE HCL (PF) 1 % IJ SOLN
INTRAMUSCULAR | Status: AC
  Filled 2018-01-10: qty 30

## 2018-01-10 MED ORDER — OXYCODONE HCL 5 MG PO TABS
ORAL_TABLET | ORAL | Status: AC
Start: 2018-01-10 — End: 2018-01-10
  Filled 2018-01-10: qty 2

## 2018-01-10 SURGICAL SUPPLY — 18 items
BAG SNAP BAND KOVER 36X36 (MISCELLANEOUS) ×2 IMPLANT
CATH OMNI FLUSH 5F 65CM (CATHETERS) ×2 IMPLANT
CATH SOFT-VU ST 4F 90CM (CATHETERS) ×2 IMPLANT
COVER DOME SNAP 22 D (MISCELLANEOUS) ×2 IMPLANT
COVER PRB 48X5XTLSCP FOLD TPE (BAG) ×1 IMPLANT
COVER PROBE 5X48 (BAG) ×1
FILTER CO2 0.2 MICRON (VASCULAR PRODUCTS) ×2 IMPLANT
KIT MICROPUNCTURE NIT STIFF (SHEATH) ×2 IMPLANT
KIT PV (KITS) ×2 IMPLANT
RESERVOIR CO2 (VASCULAR PRODUCTS) ×2 IMPLANT
SET FLUSH CO2 (MISCELLANEOUS) ×2 IMPLANT
SHEATH PINNACLE 5F 10CM (SHEATH) ×2 IMPLANT
SYR MEDRAD MARK V 150ML (SYRINGE) ×2 IMPLANT
TRANSDUCER W/STOPCOCK (MISCELLANEOUS) ×2 IMPLANT
TRAY PV CATH (CUSTOM PROCEDURE TRAY) ×2 IMPLANT
WIRE AQUATRAK .035X260 ANG (WIRE) ×2 IMPLANT
WIRE BENTSON .035X145CM (WIRE) ×2 IMPLANT
WIRE HI TORQ VERSACORE J 260CM (WIRE) ×2 IMPLANT

## 2018-01-10 NOTE — Progress Notes (Signed)
CRITICAL VALUE ALERT  Critical Value: CBG:59  Date & Time Notied: 01/10/18 0815  D50 given. Patient is asymptomatic. Will re-check CBG. CBG now 122. Will continue to monitor.

## 2018-01-10 NOTE — Progress Notes (Signed)
  Progress Note    01/10/2018 11:42 AM * No surgery date entered *  Subjective:  No acute complaints  Vitals:   01/10/18 0517 01/10/18 1051  BP: (!) 166/74 (!) 172/70  Pulse: 66 64  Resp: 18 18  Temp: 98 F (36.7 C) 97.8 F (36.6 C)  SpO2: 99% 99%    Physical Exam: aaox3 Non labored respirations Abdomen is soft with well-healed incision right lower quadrant. Right leg with palpable common femoral pulse no palpable popliteal pulse There is a dressing on the right leg and a wound on the right foot laterally 2 cm no surrounding erythema  CBC    Component Value Date/Time   WBC 4.9 01/10/2018 0439   RBC 3.15 (L) 01/10/2018 0439   HGB 8.9 (L) 01/10/2018 0439   HCT 27.9 (L) 01/10/2018 0439   PLT 135 (L) 01/10/2018 0439   MCV 88.6 01/10/2018 0439   MCH 28.3 01/10/2018 0439   MCHC 31.9 01/10/2018 0439   RDW 16.2 (H) 01/10/2018 0439   LYMPHSABS 0.7 01/04/2018 1341   MONOABS 0.3 01/04/2018 1341   EOSABS 0.0 01/04/2018 1341   BASOSABS 0.0 01/04/2018 1341    BMET    Component Value Date/Time   NA 136 01/10/2018 0439   K 3.8 01/10/2018 0439   CL 113 (H) 01/10/2018 0439   CO2 15 (L) 01/10/2018 0439   GLUCOSE 107 (H) 01/10/2018 0439   BUN 20 01/10/2018 0439   CREATININE 2.44 (H) 01/10/2018 0439   CREATININE 1.07 10/29/2014 1005   CALCIUM 8.0 (L) 01/10/2018 0439   GFRNONAA 22 (L) 01/10/2018 0439   GFRAA 25 (L) 01/10/2018 0439    INR    Component Value Date/Time   INR 1.41 01/10/2018 0439     Intake/Output Summary (Last 24 hours) at 01/10/2018 1142 Last data filed at 01/10/2018 0845 Gross per 24 hour  Intake 1465 ml  Output 750 ml  Net 715 ml     Assessment:  52 y.o. female is with history of renal transplant now has a wound on her right leg as well as right lateral foot she has had multiple toe amputations on the right she has monophasic anterior tibial tibial signals distally with the peroneal that is occluded.  Plan: Angiogram today right lower extremity  using CO2 given elevated creatinine with previous renal transplant.  We discussed the risk and benefits and she agrees to proceed the presence of her husband.   C. Randie Heinz, MD [facility] Office: [phone] Pager: [phone]  01/10/2018 11:42 AM

## 2018-01-10 NOTE — Progress Notes (Signed)
Pharmacy Antibiotic Note  [name] is a 52 y.o. female admitted on 01/04/2018 with RLE cellulitis.  She has CKD IV and hx renal transplant. Pharmacy has been consulted for daptomycin dosing in light of AKI on CKD while on vancomycin.  Renal function may now be trending down- SCr down to 2.44 today, UOP 0.9mL/kg/hr in last 24h. CrCl~25-30 ml/min.   Plan: Daptomycin 350 mg (~4 mg/kg) IV q48h Zosyn 3.375g IV q8h (infused over 4 hours) Monitor weekly CK on Thurs- next due 5/9 Monitor clinical progress, cultures, LOT  Height:  (170.2 cm) Weight: 168 lb 10.4 oz (76.5 kg) IBW/kg (Calculated) : 61.6  Temp (24hrs), Avg:98.4 F (36.9 C), Min:98 F (36.7 C), Max:98.8 F (37.1 C)  Recent Labs  Lab 01/04/18 1426 01/04/18 1555 01/04/18 1724 01/05/18 0517 01/06/18 1010 01/07/18 0309 01/08/18 0732 01/09/18 0755 01/10/18 0439  WBC  --   --  8.1 7.2 4.6 4.5  --   --  4.9  CREATININE  --   --  2.27* 2.60* 2.79* 2.86* 2.77* 2.65* 2.44*  LATICACIDVEN 1.54 1.18  --   --   --   --   --   --   --     Estimated Creatinine Clearance: 29.4 mL/min (A) (by C-G formula based on SCr of 2.44 mg/dL (H)).    Allergies  Allergen Reactions  . Latex Itching  . Ace Inhibitors Cough  . Adhesive [Tape] Itching and Other (See Comments)    Please use paper tape  . Lisinopril Cough    Antimicrobials this admission: Ceftriaxone 1g x 1 Vancomycin 4/30 >> 5/2 Zosyn 4/30 >> Daptomycin 5/2 >>  5/2 CK = 11  4/30 BCx: neg 4/30 UCx: mult species 4/30 MRSA PCR: neg 4/30 CDiff: neg  Thank you for allowing pharmacy to be a part of this patient's care. Jenika Chiem D. Sheilia Reznick, PharmD, BCPS Clinical Pharmacist Clinical Phone for 01/10/2018 until 3:30pm: [phone] If after 3:30pm, please call main pharmacy at [phone] 01/10/2018 9:52 AM

## 2018-01-10 NOTE — Op Note (Signed)
    Patient name: [name] MRN: [id number] DOB: 07-10-1966 Sex: female  01/10/2018 Pre-operative Diagnosis: Diabetes, right lower extremity wound, history of renal transplant Post-operative diagnosis:  Same Surgeon:  Luanna Salk. Randie Heinz, MD Procedure Performed: 1.  Ultrasound-guided cannulation left common femoral artery 2.  Aortogram with CO2 and right lower extremity angiogram with CO2 proximally and contrast below the knee 3.  Moderate sedation with fentanyl and Versed for 36 minutes  Indications: 52 year old female with history of diabetes has undergone a previous right lower quadrant renal transplant creatinine now baseline above 2.  She has a right pretibial lesion as well as a right lateral foot ulceration.  ABIs are noncompressible and duplex demonstrates monophasic waveforms throughout.  She is therefore indicated for angiogram with possible intervention.  Findings: There is no flow limitation in the aortoiliac segments although she is very tight bifurcation which is heavily calcified and makes up and over access difficult.  She has calcified outlines of her entire right lower extremity tree but there is no flow-limiting stenoses identified and no intervention was undertaken.  The posterior tibial is the dominant vessels of the foot with the anterior tibial and peroneal giving out of the ankle.   Procedure:  The patient was identified in the holding area and taken to room 8.  The patient was then placed supine on the table and prepped and draped in the usual sterile fashion.  A time out was called.  Ultrasound was used to identify the left common femoral artery which was noted to be patent and an image was saved to the chart.  A micropuncture needle was used to access the left common femoral artery under ultrasound guidance with direct visualization.  An 018 wire was advanced without resistance and a micropuncture sheath was placed.  The 018 wire was removed and a benson wire was placed.   The micropuncture sheath was exchanged for a 5 french sheath.  An omniflush catheter was advanced over the wire to the level of L-1.  An abdominal angiogram was obtained with CO2.  We then crossed over using Omni catheter and Glidewire and performed angiogram of the right lower extremities to the level of the knee with CO2.  We then used a straight catheter and a versa core wire to the level of the knee and perform below-knee angiogram with contrast that did not demonstrate any flow-limiting stenosis with the dominant vessel being the posterior tibial.  With this we elected to not intervene in the catheter and wire removed.  Sheath will be pulled in postop holding.  She tolerated procedure well without immediate comp occasion.  Contrast: 22 cc.  Abdias Hickam C. Randie Heinz, MD [facility] Office: [phone] Pager: [phone]

## 2018-01-10 NOTE — Progress Notes (Signed)
Physical Therapy Wound Treatment Patient Details  Name: [name] MRN: [id number] Date of Birth: December 29, 1965  Today's Date: 01/10/2018 Time: 7939-0300 Time Calculation (min): 29 min  Subjective  Subjective: I'm scared this is going to hurt Patient and Family Stated Goals: heal wound Date of Onset: (unsure/ a couple months)  Pain Score: Pt premedicated prior to treatment. Pt with c/o that medication only makes the pain less intense but does not totally aliveate pain.  Wound Assessment  Pressure Injury 01/04/18 Unstageable - Full thickness tissue loss in which the base of the ulcer is covered by slough (yellow, tan, gray, green or brown) and/or eschar (tan, brown or black) in the wound bed. Yellow (Active)  Wound Image   01/06/2018 11:00 AM  Dressing Type Moist to dry;Other (Comment);Compression wrap;Gauze (Comment) 01/10/2018 11:00 AM  Dressing Clean;Dry;Intact;Changed 01/10/2018 11:00 AM  Dressing Change Frequency Daily 01/10/2018 11:00 AM  State of Healing Non-healing 01/10/2018 11:00 AM  Site / Wound Assessment Yellow;Brown 01/10/2018 11:00 AM  % Wound base Red or Granulating 5% 01/10/2018 11:00 AM  % Wound base Yellow/Fibrinous Exudate 95% 01/10/2018 11:00 AM  % Wound base Black/Eschar 0% 01/10/2018 11:00 AM  % Wound base Other/Granulation Tissue (Comment) 0% 01/04/2018  9:03 PM  Peri-wound Assessment Intact;Pink;Edema;Erythema (blanchable);Denuded 01/10/2018 11:00 AM  Wound Length (cm) 3.5 cm 01/06/2018 11:00 AM  Wound Width (cm) 3 cm 01/06/2018 11:00 AM  Wound Depth (cm) 0.8 cm 01/06/2018 11:00 AM  Wound Surface Area (cm^2) 10.5 cm^2 01/06/2018 11:00 AM  Wound Volume (cm^3) 8.4 cm^3 01/06/2018 11:00 AM  Undermining (cm) 0.5 cm 6-10 o'clock 01/07/2018  1:00 PM  Margins Unattached edges (unapproximated) 01/10/2018 11:00 AM  Drainage Amount Minimal 01/10/2018 11:00 AM  Drainage Description Serosanguineous;Odor 01/10/2018 11:00 AM  Treatment Debridement (Selective);Hydrotherapy (Pulse lavage);Packing (Saline gauze)  01/10/2018 11:00 AM   Santyl applied to necrotic wound bed    Hydrotherapy Pulsed lavage therapy - wound location:  R tibia  Pulsed Lavage with Suction (psi): 8 psi(8-12 psi) Pulsed Lavage with Suction - Normal Saline Used: 1000 mL Pulsed Lavage Tip: Tip with splash shield Selective Debridement Selective Debridement - Location: R tibia Selective Debridement - Tools Used: Forceps;Scissors Selective Debridement - Tissue Removed: yellow slough loosened by pulsed lavage   Wound Assessment and Plan  Wound Therapy - Assess/Plan/Recommendations Wound Therapy - Clinical Statement: Dressing in place on entry. With removal a small amont of pink tissue could be seen at the base of the wound. A moderate amount of fibinous yellow slough could be removed from the base and undermined areas of the wound. Undermining is now appreciable 360 degrees around the wound margins. Pt will continue to benefit from pulsed lavage and debridement to decrease bioburden and to promote wound healing. Wound Therapy - Functional Problem List: decreased healing capacity due to medication  Factors Delaying/Impairing Wound Healing: Diabetes Mellitus;Multiple medical problems;Polypharmacy Hydrotherapy Plan: Debridement;Patient/family education;Pulsatile lavage with suction Wound Therapy - Frequency: 6X / week Wound Therapy - Follow Up Recommendations: Home health RN Wound Plan: see above  Wound Therapy Goals- Improve the function of patient's integumentary system by progressing the wound(s) through the phases of wound healing (inflammation - proliferation - remodeling) by: Decrease Necrotic Tissue to: 30 Decrease Necrotic Tissue - Progress: Progressing toward goal Increase Granulation Tissue to: 70 Increase Granulation Tissue - Progress: Progressing toward goal Goals/treatment plan/discharge plan were made with and agreed upon by patient/family: Yes Time For Goal Achievement: 7 days Wound Therapy - Potential for Goals:  Fair  Goals will be  updated until maximal potential achieved or discharge criteria met.  Discharge criteria: when goals achieved, discharge from hospital, MD decision/surgical intervention, no progress towards goals, refusal/missing three consecutive treatments without notification or medical reason.  GP    Dani Gobble. Migdalia Dk PT, DPT Acute Rehabilitation  [phone] Pager [phone]   [place] 01/10/2018, 12:02 PM

## 2018-01-10 NOTE — Progress Notes (Signed)
PROGRESS NOTE    [name]  JEH:[id number] DOB: [birth year] DOA: 01/04/2018 PCP: Hayden Rasmussen, MD  Brief Narrative: 52 year old female with history of chronic kidney disease stage 3, history of renal transplant on immunosuppression( cadaveric renal transplant at [facility] in 2011) type 2 diabetes, chronic right leg wound presented to the emergency room with increasing swelling pain tenderness and discharge. -In addition she had diarrhea since arrival in the emergency room. - she was previously seen in the ED on 4/10 and treated with a course of Doxy and then given Bactrim few days ago on next ED visit, -Found to have deep right leg wound with extensive subcutaneous fluid collections, and worsening renal failure -[facility] consulting, ABI and duplex abnormal, plan for Arteriogram on Monday   Assessment & Plan:   1. Subacute Right leg wound with secondary infection/abscess -extensive subcutaneous fluid collection noted on MRI -Large R leg wound followed at the wound center for 1 month, she is immunosuppressed secondary to transplant meds, previously Rx with Doxycycline and now Bactrim -changed IV vancomycin to daptomycin secondary to renal failure, continue Zosyn for now -[facility] consulted, arterial duplex is abnormal, plan for possible CO2 arteriogram today, will likley need some contrast too per VVS, continue IVF -ESR is 93 and CRP is 3.9 -getting Hydrotherapy too  2. 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 Hypertension -Continue Coreg, hold amlodipine and Lasix   4.  Diarrhea -Likely secondary to recent antibiotic use and prograf suspected to be contributing, prograf level is 5.6 -C. Difficile PCR negative -improved  5. Status post renal Transplant -Continue Prednisone 5 mg,CellCept and Prograf -prograf level ok at 5.6  6. Type 1 diabetes mellitus -with hypoglycemia, had cutdown insulin 7030 dose, Then stopped, -Continue sliding scale insulin for now -cut down  lantus, SSI  DVT prophylaxis:heparin subcutaneous Code Status: full code Family Communication:no family at bedside, called and discussed with spouse 5/2 and 5/5 Disposition Plan: pending improvement in kidney function and surgical eval, angio  Consultants: [facility] Nephrology     Procedures:   Antimicrobials:  Antibiotics Given (last 72 hours)    Date/Time Action Medication Dose Rate   01/07/18 1524 New Bag/Given   piperacillin-tazobactam (ZOSYN) IVPB 3.375 g 3.375 g 12.5 mL/hr   01/07/18 2228 New Bag/Given   piperacillin-tazobactam (ZOSYN) IVPB 3.375 g 3.375 g 12.5 mL/hr   01/08/18 0820 New Bag/Given   piperacillin-tazobactam (ZOSYN) IVPB 3.375 g 3.375 g 12.5 mL/hr   01/08/18 1641 New Bag/Given   piperacillin-tazobactam (ZOSYN) IVPB 3.375 g 3.375 g 12.5 mL/hr   01/08/18 1723 New Bag/Given   DAPTOmycin (CUBICIN) 350 mg in sodium chloride 0.9 % IVPB 350 mg 214 mL/hr   01/08/18 2343 New Bag/Given   piperacillin-tazobactam (ZOSYN) IVPB 3.375 g 3.375 g 12.5 mL/hr   01/09/18 0805 New Bag/Given   piperacillin-tazobactam (ZOSYN) IVPB 3.375 g 3.375 g 12.5 mL/hr   01/09/18 1607 New Bag/Given   piperacillin-tazobactam (ZOSYN) IVPB 3.375 g 3.375 g 12.5 mL/hr   01/09/18 2332 New Bag/Given   piperacillin-tazobactam (ZOSYN) IVPB 3.375 g 3.375 g 12.5 mL/hr   01/10/18 0838 New Bag/Given   piperacillin-tazobactam (ZOSYN) IVPB  3.375 g 3.375 g 12.5 mL/hr      Subjective: -diarrhea  A bit better, continues to have leg pain  Objective: Vitals:   01/09/18 1617 01/09/18 2019 01/10/18 0517  01/10/18 1051  BP: (!) 184/73 (!) 154/73 (!) 166/74 (!) 172/70  Pulse: 78 73 66 64  Resp: _0 Temp: 98.8 F (37.1 C) 98.3 F (36.8 C) 98 F (36.7 C) 97.8 F (36.6 C)  TempSrc: Oral Oral Oral Oral  SpO2: 99% 99% 99% 99%  Weight:  76.5 kg (168 lb 10.4 oz)    Height:        Intake/Output Summary (Last 24 hours) at 01/10/2018 1054 Last data filed at 01/10/2018 0845 Gross per 24 hour  Intake 1465 ml  Output 751 ml  Net 714 ml   Filed Weights   01/07/18 2056 01/08/18 2103 01/09/18 2019  Weight: 83.1 kg (183 lb 3.2 oz) 74.7 kg (164 lb 10.9 oz) 76.5 kg (168 lb 10.4 oz)    Examination:  Gen: Awake, Alert, Oriented X 3, no distress HEENT: PERRLA, Neck supple, no JVD Lungs: Good air movement bilaterally, CTAB CVS: RRR,No Gallops,Rubs or new Murmurs Abd: soft, Non tender, non distended, BS present Extremities:right mid leg with quarter-sized deep ulcer with purulent base, surrounding swelling and inflammation, 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8  CL 110 113* 115* 111 113*  CO2 18* 17* 17* 12* 15*  GLUCOSE 79 43* 161* 340* 107*  BUN 35* 33* 25* 21* 20  CREATININE 2.79* 2.86* 2.77* 2.65* 2.44*  CALCIUM 8.6* 8.3* 8.3* 8.2* 8.0*   GFR: Estimated Creatinine Clearance: 29.4 mL/min (A) (by C-G formula based on SCr of 2.44 mg/dL (H)). Liver Function  Tests: Recent Labs  Lab 01/04/18 1341 01/05/18 0517 01/06/18 1010  AST 11* 19 349*  ALT 11* 11* 155*  ALKPHOS 99 78 70  BILITOT 1.3* 0.9 0.6  PROT 6.8 5.7* 6.3*  ALBUMIN 2.1* 1.8* 1.8*   No results for input(s): LIPASE, AMYLASE in the last 168 hours. No results for input(s): AMMONIA in the last 168 hours. Coagulation Profile: Recent Labs  Lab 01/10/18 0439  INR 1.41   Cardiac Enzymes: Recent Labs  Lab 01/06/18 1010  CKTOTAL 11*   BNP (last 3 results) No results for input(s): PROBNP in the last 8760 hours. HbA1C: Recent Labs    01/09/18 0755  HGBA1C 8.4*   CBG: Recent Labs  Lab 01/09/18 1153 01/09/18 1650 01/09/18 2153 01/10/18 0820 01/10/18 0943  GLUCAP 196* 223* 184* 59* 122*   Lipid Profile: No results for input(s): CHOL, HDL, LDLCALC, TRIG, CHOLHDL, LDLDIRECT in the last 72 hours. Thyroid Function Tests: No results for input(s): TSH, T4TOTAL, FREET4, T3FREE, THYROIDAB in the  last 72 hours. Anemia Panel: No results for input(s): VITAMINB12, FOLATE, FERRITIN, TIBC, IRON, RETICCTPCT in the last 72 hours. Urine analysis:    Component Value Date/Time   COLORURINE YELLOW 01/04/2018 1341   APPEARANCEUR CLOUDY (A) 01/04/2018 1341   LABSPEC 1.015 01/04/2018 1341   PHURINE 6.0 01/04/2018 1341   GLUCOSEU >=500 (A) 01/04/2018 1341   HGBUR LARGE (A) 01/04/2018 1341   BILIRUBINUR NEGATIVE 01/04/2018 1341   KETONESUR 20 (A) 01/04/2018 1341   PROTEINUR 100 (A) 01/04/2018 1341   UROBILINOGEN 0.2 03/02/2015 1153   NITRITE NEGATIVE 01/04/2018 1341   LEUKOCYTESUR MODERATE (A) 01/04/2018 1341   Sepsis Labs: _0 (procalcitonin:4,lacticidven:4)  ) Recent Results (from the past 240 hour(s))  Urine culture     Status: Abnormal   Collection Time: 01/01/18  8:52 PM  Result Value Ref Range Status   Specimen Description   Final    URINE, RANDOM Performed at [facility], [place] [address] [place], [place] [address]    Special Requests    Final    NONE Performed at [facility], [place] [address], [place], [place] [address]    Culture (A)  Final    <10,000 COLONIES/mL INSIGNIFICANT GROWTH Performed at [place] [address], [place], [place] [address]    Report Status 01/03/2018 FINAL  Final  Blood Culture (routine x 2)     Status: None   Collection Time: 01/04/18  3:34 PM  Result Value Ref Range Status   Specimen Description BLOOD RIGHT ARM  Final   Special Requests   Final    BOTTLES DRAWN AEROBIC AND ANAEROBIC Blood Culture adequate volume   Culture   Final    NO GROWTH 5 DAYS Performed at [facility], [place] [address], [place], [place] [address]    Report Status 01/09/2018 FINAL  Final  Blood Culture (routine x 2)     Status: None   Collection Time: 01/04/18  4:00 PM  Result Value Ref Range Status   Specimen Description BLOOD LEFT WRIST  Final   Special Requests   Final    BOTTLES DRAWN AEROBIC AND ANAEROBIC Blood Culture adequate volume   Culture   Final    NO GROWTH 5 DAYS Performed at [facility], [place] [address] [place], [place] [address]    Report Status 01/09/2018 FINAL  Final  Urine culture     Status: Abnormal   Collection Time: 01/04/18  5:24 PM  Result Value Ref Range Status   Specimen Description URINE, CLEAN CATCH  Final   Special Requests   Final    NONE Performed at [facility], [place] [address] [place],  [address]    Culture MULTIPLE SPECIES PRESENT, SUGGEST RECOLLECTION (A)  Final   Report Status 01/05/2018 FINAL  Final  C difficile quick scan w PCR reflex     Status: None   Collection Time: 01/04/18  5:45 PM  Result Value Ref Range Status   C Diff antigen NEGATIVE NEGATIVE Final   C Diff toxin NEGATIVE NEGATIVE Final   C Diff interpretation No C. difficile detected.  Final  MRSA PCR Screening     Status: None   Collection Time: 01/04/18  7:47 PM  Result Value Ref Range Status   MRSA by PCR NEGATIVE NEGATIVE Final    Comment:         The GeneXpert MRSA Assay (FDA approved for NASAL specimens only), is one component of a comprehensive MRSA  colonization surveillance program. It is not intended to diagnose MRSA infection nor to guide or monitor treatment for MRSA infections. Performed at [facility], [place] [address], [place], [place] [address]          Radiology Studies: No results found.      Scheduled Meds: . carvedilol  25 mg Oral BID WC  . collagenase   Topical Daily  . heparin  5,000 Units Subcutaneous Q8H  . insulin aspart  0-5 Units Subcutaneous QHS  . insulin aspart  0-9 Units Subcutaneous TID WC  . insulin glargine  15 Units Subcutaneous Daily  . levothyroxine  137 mcg Oral Daily  . mycophenolate  500 mg Oral BID  . predniSONE  5 mg Oral Daily  . senna-docusate  1 tablet Oral QHS  . sodium bicarbonate  1,300 mg Oral BID  . tacrolimus  0.5 mg Oral QHS  . tacrolimus  1 mg Oral Daily  . vitamin B-12  100 mcg Oral Daily   Continuous Infusions: . DAPTOmycin (CUBICIN)  IV Stopped (01/08/18 1820)  . piperacillin-tazobactam (ZOSYN)  IV 3.375 g (01/10/18 0838)     LOS: 6 days    Time spent: 94mn    PDomenic Polite MD [facility] Page via [email or web address], password [email or web address] After 7PM please contact night-coverage  01/10/2018, 10:54 AM

## 2018-01-10 NOTE — Progress Notes (Signed)
Arteriogram reviewed and discussed with pt.  No significant arterial occlusive disease only calcification.  No further vascular intervention at this point.  I discussed with Dr Lajoyce Corners.  He will reassess pt for options regarding her wounds  Fabienne Bruns, MD [facility] Office: [phone] Pager: [phone]

## 2018-01-10 NOTE — Progress Notes (Signed)
[facility] Progress Note    Assessment/ Plan:   52 yo female with PMH significant of CKD with previous renal transplant in 2011 on chronic suppressive therapy, HTN, DM, hypothyroidism presented with a subacute nonhealing R leg wound and now with acute kidney injury with worsening serum creatinine up to 2.7 from baseline 1.8 likely AIN or ATN from recent antibiotic treatments for leg wound.   1. AKI on CKD IV in the setting of h/o renal transplant. Intrinsic injury from recent bactrim and vancomycin use. SCr up to 2.6 from baseline 1.8, improved to 2.4 with MIVF started as pretreatment for CO2 arteriogram as patient is high risk for contrast nephropathy. Now starting to have LE edema so can DC NS 100cc/hr. 2. H/o renal transplant. Continue prednisone, cellcept, prograf.  3. Subacute R leg wound with secondary infection/abscess. Switched from vancomycin to daptomycin per primary. 4.  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 Per VVS, scheduled for R LE angiogram. 5. HTN, elevated BPs. Continue coreg. Amlodipine held per primary, consider restart. 6. Anemia,  Hgb at baseline ~9. Stable. Monitor 7. Diabetes per primary 8. Hypothyroidism   Subjective:   Feels overall well this morning, her R leg continues to be tender. No SOB or wheezing. No orthopnea. No fever/chills   Objective:   BP (!) 166/74 (BP Location: Left Arm)   Pulse 66   Temp 98 F (36.7 C) (Oral)   Resp 18   Ht  (1.702 m)   Wt 168 lb 10.4 oz (76.5 kg)   LMP 11/19/2011 Comment: pt had tubal ligation  SpO2 99%   BMI 26.41 kg/m   Intake/Output Summary (Last 24 hours) at 01/10/2018 0753 Last data filed at 01/10/2018 0636 Gross per 24 hour  Intake 1970 ml  Output 1452 ml  Net 518 ml   Weight change: 3 lb 15.5 oz (1.8 kg)  Physical Exam: WUJ:[id number] in bed comfortably, in NAD CVS: regular rate and rhythm, normal S1 and S2, no murmurs.  Resp: CTAB, normal  effort on room air. No crackles. NWG:NFAO, nontender, nondistended, + bowel sounds Ext:R shin with small tibial wound, dressing C/D/I. Bilateral 1+ edema in ankles.  Imaging: No results found.  Labs: BMET Recent Labs  Lab 01/04/18 1341 01/04/18 1724 01/05/18 0517 01/06/18 1010 01/07/18 0309 01/08/18 0732 01/09/18 0755 01/10/18 0439  NA 130*  --  135 137 139 140 135 136  K 3.7  --  3.9 3.5 3.7 4.1 4.3 3.8  CL 100*  --  108 110 113* 115* 111 113*  CO2 16*  --  16* 18* 17* 17* 12* 15*  GLUCOSE 456*  --  285* 79 43* 161* 340* 107*  BUN 36*  --  34* 35* 33* 25* 21* 20  CREATININE 2.41* 2.27* 2.60* 2.79* 2.86* 2.77* 2.65* 2.44*  CALCIUM 8.7*  --  8.2* 8.6* 8.3* 8.3* 8.2* 8.0*   CBC Recent Labs  Lab 01/04/18 1341  01/05/18 0517 01/06/18 1010 01/07/18 0309 01/10/18 0439  WBC 6.4   < > 7.2 4.6 4.5 4.9  NEUTROABS 5.3  --   --   --   --   --   HGB 8.8*   < > 8.3* 8.9* 8.8* 8.9*  HCT 27.6*   < > 26.8* 28.5* 29.0* 27.9*  MCV 87.6   < > 89.3 89.3 90.3 88.6  PLT 239   < > 202 183 213 135*   < > = values in this  interval not displayed.    Medications:    . carvedilol  25 mg Oral BID WC  . collagenase   Topical Daily  . heparin  5,000 Units Subcutaneous Q8H  . insulin aspart  0-5 Units Subcutaneous QHS  . insulin aspart  0-9 Units Subcutaneous TID WC  . insulin glargine  15 Units Subcutaneous Daily  . levothyroxine  137 mcg Oral Daily  . mycophenolate  500 mg Oral BID  . predniSONE  5 mg Oral Daily  . senna-docusate  1 tablet Oral QHS  . sodium bicarbonate  1,300 mg Oral BID  . tacrolimus  0.5 mg Oral QHS  . tacrolimus  1 mg Oral Daily  . vitamin B-12  100 mcg Oral Daily      Leland Her, DO PGY-2, [facility] 01/10/2018 7:53 AM

## 2018-01-10 NOTE — Progress Notes (Signed)
Site area: LFA x 1 Site Prior to Removal:  Level 0 Pressure Applied For: 25 minutes Manual:   yes Patient Status During Pull:  stable Post Pull Site:  Level 0 Post Pull Instructions Given: yes  Post Pull Pulses Present: doppler Dressing Applied:  tegaderm and gauze Bedrest begins @ 1430 Comments:

## 2018-01-11 ENCOUNTER — Encounter (HOSPITAL_COMMUNITY): Payer: Self-pay | Admitting: Vascular Surgery

## 2018-01-11 DIAGNOSIS — L03115 Cellulitis of right lower limb: Secondary | ICD-10-CM

## 2018-01-11 LAB — CBC
HCT: 28.7 % — ABNORMAL LOW (ref 36.0–46.0)
Hemoglobin: 9 g/dL — ABNORMAL LOW (ref 12.0–15.0)
MCH: 27.7 pg (ref 26.0–34.0)
MCHC: 31.4 g/dL (ref 30.0–36.0)
MCV: 88.3 fL (ref 78.0–100.0)
PLATELETS: 131 10*3/uL — AB (ref 150–400)
RBC: 3.25 MIL/uL — ABNORMAL LOW (ref 3.87–5.11)
RDW: 16.2 % — AB (ref 11.5–15.5)
WBC: 4 10*3/uL (ref 4.0–10.5)

## 2018-01-11 LAB — BASIC METABOLIC PANEL
Anion gap: 6 (ref 5–15)
BUN: 20 mg/dL (ref 6–20)
CO2: 19 mmol/L — ABNORMAL LOW (ref 22–32)
CREATININE: 2.38 mg/dL — AB (ref 0.44–1.00)
Calcium: 8.3 mg/dL — ABNORMAL LOW (ref 8.9–10.3)
Chloride: 114 mmol/L — ABNORMAL HIGH (ref 101–111)
GFR calc Af Amer: 26 mL/min — ABNORMAL LOW (ref >60)
GFR calc non Af Amer: 23 mL/min — ABNORMAL LOW (ref >60)
GLUCOSE: 224 mg/dL — AB (ref 65–99)
Potassium: 4 mmol/L (ref 3.5–5.1)
SODIUM: 139 mmol/L (ref 135–145)

## 2018-01-11 LAB — GLUCOSE, CAPILLARY
GLUCOSE-CAPILLARY: 243 mg/dL — AB (ref 65–99)
Glucose-Capillary: 203 mg/dL — ABNORMAL HIGH (ref 65–99)

## 2018-01-11 MED ORDER — PENTOXIFYLLINE ER 400 MG PO TBCR: 400.0000 mg | EXTENDED_RELEASE_TABLET | Freq: Three times a day (TID) | ORAL | 1 refills | Status: DC

## 2018-01-11 MED ORDER — COLLAGENASE 250 UNIT/GM EX OINT: TOPICAL_OINTMENT | Freq: Every day | CUTANEOUS | 0 refills | Status: DC

## 2018-01-11 MED ORDER — DOXYCYCLINE HYCLATE 100 MG PO TABS
100.0000 mg | ORAL_TABLET | Freq: Two times a day (BID) | ORAL | Status: DC
  Administered 2018-01-11: 100 mg via ORAL
  Filled 2018-01-11: qty 1

## 2018-01-11 MED ORDER — DOXYCYCLINE HYCLATE 100 MG PO TABS: 100.0000 mg | ORAL_TABLET | Freq: Two times a day (BID) | ORAL | 0 refills | Status: DC

## 2018-01-11 MED ORDER — PENTOXIFYLLINE ER 400 MG PO TBCR
400.0000 mg | EXTENDED_RELEASE_TABLET | Freq: Three times a day (TID) | ORAL | Status: DC
  Administered 2018-01-11: 400 mg via ORAL
  Filled 2018-01-11 (×2): qty 1

## 2018-01-11 MED ORDER — OXYCODONE HCL 5 MG PO TABS: 5.0000 mg | ORAL_TABLET | Freq: Four times a day (QID) | ORAL | 0 refills | Status: DC | PRN

## 2018-01-11 MED ORDER — HYDRALAZINE HCL 20 MG/ML IJ SOLN: 10.0000 mg | INTRAMUSCULAR | Status: DC | PRN

## 2018-01-11 NOTE — Plan of Care (Signed)
  Problem: Elimination: Goal: Will not experience complications related to bowel motility Outcome: Progressing   Problem: Elimination: Goal: Will not experience complications related to urinary retention Outcome: Progressing   Problem: Education: Goal: Knowledge of General Education information will improve Outcome: Progressing

## 2018-01-11 NOTE — Progress Notes (Signed)
Patient talks about trying to change diet for health reasons and RN mentioned a better diet will also help with wound healing, consult ordered.

## 2018-01-11 NOTE — Progress Notes (Signed)
[facility] Progress Note    Assessment/ Plan:   52 yo female with PMH significant of CKD with previous renal transplant in 2011 on chronic suppressive therapy, HTN, DM, hypothyroidism presented with a subacute nonhealing R leg wound and now with acute kidney injury with worsening serum creatinine from baseline 1.8 likely AIN or ATN from recent antibiotic treatments for leg wound.   1. AKI on CKD IV in the setting of h/o renal transplant. Intrinsic injury from recent bactrim and vancomycin use. SCr improved to 2.3 today, doing well. Now s/p CO2 arteriogram and patient is high risk for contrast nephropathy.  2. H/o renal transplant. Continue prednisone, cellcept, prograf.  3. Subacute R leg wound with secondary infection/abscess. On daptomycin per primary. 4.  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 S/p CO2 arteriogram per [facility]VS on 01/10/18 and started on medical management. 5. HTN, improved. Continue coreg. 6. Anemia,  Hgb at baseline ~9. Stable. Monitor 7. Diabetes per primary 8. Hypothyroidism Nephrology signing off as patient doing well and approaching end of hospital stay. She will have Cr rechecked at the end of this week and a 3 week follow up with Dr. Arrie Aran as outpatient.  Subjective:   Feels well today and no new complaints.    Objective:   BP 133/62 (BP Location: Left Arm)   Pulse 64   Temp (!) 97.3 F (36.3 C) (Oral)   Resp 18   Ht  (1.702 m)   Wt 168 lb 8 oz (76.4 kg) Comment: scale a  LMP 11/19/2011 Comment: pt had tubal ligation  SpO2 100%   BMI 26.39 kg/m   Intake/Output Summary (Last 24 hours) at 01/11/2018 0733 Last data filed at 01/11/2018 0601 Gross per 24 hour  Intake 1669.85 ml  Output 1100 ml  Net 569.85 ml   Weight change: 1 lb 10.4 oz (0.748 kg)  Physical Exam: QQV:ZDGLOV in bed comfortably, in NAD CVS: regular rate and rhythm, normal S1 and S2, no murmurs.  Resp: CTAB, normal effort on  room air. No crackles. FIE:PPIR, nontender, nondistended, + bowel sounds Ext:R shin with small tibial wound, dressing C/D/I. No LE edema  Imaging: No results found.  Labs: BMET Recent Labs  Lab 01/05/18 0517 01/06/18 1010 01/07/18 0309 01/08/18 0732 01/09/18 0755 01/10/18 0439 01/11/18 0606  NA 135 137 139 140 135 136 139  K 3.9 3.5 3.7 4.1 4.3 3.8 4.0  CL 108 110 113* 115* 111 113* 114*  CO2 16* 18* 17* 17* 12* 15* 19*  GLUCOSE 285* 79 43* 161* 340* 107* 224*  BUN 34* 35* 33* 25* 21* 20 20  CREATININE 2.60* 2.79* 2.86* 2.77* 2.65* 2.44* 2.38*  CALCIUM 8.2* 8.6* 8.3* 8.3* 8.2* 8.0* 8.3*   CBC Recent Labs  Lab 01/04/18 1341  01/06/18 1010 01/07/18 0309 01/10/18 0439 01/11/18 0606  WBC 6.4   < > 4.6 4.5 4.9 4.0  NEUTROABS 5.3  --   --   --   --   --   HGB 8.8*   < > 8.9* 8.8* 8.9* 9.0*  HCT 27.6*   < > 28.5* 29.0* 27.9* 28.7*  MCV 87.6   < > 89.3 90.3 88.6 88.3  PLT 239   < > 183 213 135* 131*   < > = values in this interval not displayed.    Medications:    . carvedilol  25 mg Oral BID WC  . collagenase   Topical Daily  .  heparin  5,000 Units Subcutaneous Q8H  . insulin aspart  0-5 Units Subcutaneous QHS  . insulin aspart  0-9 Units Subcutaneous TID WC  . insulin glargine  10 Units Subcutaneous Daily  . levothyroxine  137 mcg Oral Daily  . mycophenolate  500 mg Oral BID  . pentoxifylline  400 mg Oral TID WC  . predniSONE  5 mg Oral Daily  . senna-docusate  1 tablet Oral QHS  . sodium bicarbonate  1,300 mg Oral BID  . sodium chloride flush  3 mL Intravenous Q12H  . tacrolimus  0.5 mg Oral QHS  . tacrolimus  1 mg Oral Daily  . vitamin B-12  100 mcg Oral Daily      Leland Her, DO PGY-2, [facility] 01/11/2018 7:33 AM

## 2018-01-11 NOTE — Progress Notes (Signed)
Dressing changed on left forearm.

## 2018-01-11 NOTE — Progress Notes (Signed)
Brief Nutrition Education Note  RD consulted for diet education and wound healing.   Pt s/p ultrasound-guided cannulation left common femoral artery; aortogram with CO2 and right lower extremity angiogram with CO2 proximally and contrast below the knee.   Spoke with pt at bedside, who was in good spirits (sitting up in bed). She reports "I'm going home". Noted discharge packet on tray table. Pt shares that she always has a good appetite, but at home, she doesn't always choose healthy options. She consumes 3 meals per day, which are usually prepared by her husband (meals usually consist of meat and two vegetables). She has been trying to incorporate more fruits and vegetables in her diet and always rinses canned goods.   Last Hgb A1c: 8.4 (01/09/18). When asked about glycemic control at home, pt reports "I'm a brittle diabetic". PTA DM medications 20 units insulin 70/30 BID; she denies difficulty obtaining her medications. CBGS: 135-243 (inpatient orders for glycemic control are 0-5 units insulin aspart q HS, 0-9 units insulin aspart TID with meals, and 10 units insulin aspart daily).   Pt very concerned about wound healing. She expressed desire to choose healthier foods at home. Discussed importance of increased protein intake and optimizing glycemic control to promote wound healing. Offered education handouts and additional information, however, pt politely declined.   Expect fair compliance. Noted discharge orders today; pt awaiting ride home.   Doy Taaffe A. Mayford Knife, RD, LDN, CDE Pager: [phone] After hours Pager: [phone]

## 2018-01-11 NOTE — Care Management Important Message (Signed)
Important Message  Patient Details  Name: [name] MRN: [id number] Date of Birth: August 11, 1966   Medicare Important Message Given:  Yes    [name] P [name] 01/11/2018, 12:52 PM

## 2018-01-11 NOTE — Progress Notes (Signed)
Reviewed discharge instructions with patient. Answered her questions. Pt is stable and ready for discharge. Waiting on transport.

## 2018-01-11 NOTE — Evaluation (Signed)
Physical Therapy Evaluation Patient Details Name: [name] MRN: [id number] DOB: 1965/11/30 Today's Date: 01/11/2018   History of Present Illness  [name] is a 52 y.o. female with medical history significant of chronic kidney disease stage IV with hypertension and previous renal transplant on chronic suppressive therapy who presented to the ER with purulent wound on her right leg as well as generalize swelling, redness and tenderness.  Clinical Impression  Pt functioning near baseline. Pt able to tolerated R LE WBing for household distance ambulation and is safe with transfers. Pt safe to d/c home with 24/7 assist from family once medically stable.    Follow Up Recommendations Home health PT;Supervision/Assistance - 24 hour(con't services with [facility])    Equipment Recommendations  None recommended by PT(has DME)    Recommendations for Other Services       Precautions / Restrictions Precautions Precautions: Fall Restrictions Weight Bearing Restrictions: No      Mobility  Bed Mobility Overal bed mobility: Modified Independent             General bed mobility comments: HOB elevated, increased time, used bed rail but no physical assist  Transfers Overall transfer level: Needs assistance Equipment used: Rolling walker (2 wheeled) Transfers: Sit to/from Stand Sit to Stand: Min guard         General transfer comment: v/c's to push up from bed, min guard to steady during transition of hands from bed to RW  Ambulation/Gait Ambulation/Gait assistance: Min guard Ambulation Distance (Feet): 150 Feet Assistive device: Rolling walker (2 wheeled) Gait Pattern/deviations: Step-through pattern Gait velocity: decreaed Gait velocity interpretation: <1.31 ft/sec, indicative of household ambulator General Gait Details: mild R LE limp, no episode of LOB  Stairs            Wheelchair Mobility    Modified Rankin (Stroke Patients Only)        Balance Overall balance assessment: Mild deficits observed, not formally tested                                           Pertinent Vitals/Pain Pain Assessment: 0-10 Pain Score: 8  Pain Location: R leg at wound site Pain Descriptors / Indicators: Throbbing Pain Intervention(s): Premedicated before session    Home Living Family/patient expects to be discharged to:: Private residence Living Arrangements: Spouse/significant other Available Help at Discharge: Family;Available 24 hours/day Type of Home: Mobile home Home Access: Stairs to enter Entrance Stairs-Rails: Right;Left;Can reach both Entrance Stairs-Number of Steps: 6 in the front Home Layout: One level Home Equipment: Walker - 2 wheels;Bedside commode;Shower seat;Wheelchair - manual;Grab bars - tub/shower      Prior Function Level of Independence: Needs assistance   Gait / Transfers Assistance Needed: pt uses both RW and w/c t/o day fo rmobility  ADL's / Homemaking Assistance Needed: pt can perform dressing and bathing spouse does household chores  Comments: pt has been receiving HH PT for about a month     Hand Dominance   Dominant Hand: Right    Extremity/Trunk Assessment   Upper Extremity Assessment Upper Extremity Assessment: Generalized weakness    Lower Extremity Assessment Lower Extremity Assessment: Generalized weakness;RLE deficits/detail RLE Deficits / Details: pt with open ant tib wound  minimizing active ROM    Cervical / Trunk Assessment Cervical / Trunk Assessment: Normal  Communication   Communication: No difficulties  Cognition Arousal/Alertness: Awake/alert  Behavior During Therapy: WFL for tasks assessed/performed Overall Cognitive Status: Within Functional Limits for tasks assessed                                        General Comments General comments (skin integrity, edema, etc.): pt with opend R ant tib wound being address by hydro therapy     Exercises     Assessment/Plan    PT Assessment Patient needs continued PT services  PT Problem List Decreased strength;Decreased range of motion;Decreased activity tolerance;Decreased knowledge of use of DME;Decreased balance;Decreased mobility       PT Treatment Interventions DME instruction;Gait training;Stair training;Functional mobility training;Therapeutic activities;Therapeutic exercise;Balance training;Neuromuscular re-education    PT Goals (Current goals can be found in the Care Plan section)  Acute Rehab PT Goals Patient Stated Goal: home asap PT Goal Formulation: With patient Time For Goal Achievement: 01/25/18 Potential to Achieve Goals: Good    Frequency Min 2X/week   Barriers to discharge        Co-evaluation               AM-PAC PT "6 Clicks" Daily Activity  Outcome Measure Difficulty turning over in bed (including adjusting bedclothes, sheets and blankets)?: None Difficulty moving from lying on back to sitting on the side of the bed? : None Difficulty sitting down on and standing up from a chair with arms (e.g., wheelchair, bedside commode, etc,.)?: A Little Help needed moving to and from a bed to chair (including a wheelchair)?: A Little Help needed walking in hospital room?: A Little Help needed climbing 3-5 steps with a railing? : A Lot 6 Click Score: 19    End of Session Equipment Utilized During Treatment: Gait belt Activity Tolerance: Patient tolerated treatment well Patient left: in bed;with call bell/phone within reach;with bed alarm set Nurse Communication: Mobility status PT Visit Diagnosis: Unsteadiness on feet (R26.81)    Time: 1610-9604 PT Time Calculation (min) (ACUTE ONLY): 29 min   Charges:   PT Evaluation $PT Eval Moderate Complexity: 1 Mod PT Treatments $Gait Training: 8-22 mins   PT G Codes:        Lewis Shock, PT, DPT Pager #: [phone] Office #: [phone]   Jahleah Mariscal M Taimi Towe 01/11/2018, 12:01 PM

## 2018-01-11 NOTE — Progress Notes (Addendum)
Patient is active with [facility] as prior to admission for HHRN/ PT/OT/ aide and SW; Alexis Goodell [phone]  2:25 pm - Patient is for discharge home today and request ambulance transportation; home address verified by spouse; [facility] non emergent ambulance called; paperwork for ambulance transportation placed on the shadow chart; B [place] RN,MHA,BSN [phone]

## 2018-01-11 NOTE — Progress Notes (Addendum)
Benefit Check Per CMA: Mardene Sayer         # 1. Patient has : [employer] AND MEDICAID      CO-PAY - $3.80 FOR EACH RX    Previous Messages    ----- Message -----  From: Yancey Flemings, RN  Sent: 01/11/2018  8:41 AM  To: [facility] Case Mgr Assistant  Subject: Benefit Check                   Please do benefit check for:  Trental 400 mg 3 times a day

## 2018-01-11 NOTE — Progress Notes (Signed)
Patients BP elevated, MD notified to modify BP med frequency.

## 2018-01-11 NOTE — Progress Notes (Signed)
Patient ID: [name], female   DOB: Oct 02, 1965, 52 y.o.   MRN: [id number] I discussed patient's vascular findings with Dr. Darrick Penna.  Patient has in-line flow to the foot with calcified vessels.  There are no revascularization options to improve circulation to the right lower extremity.  I have ordered Trental 400 mg 3 times a day patient should be discharged with this medication this should help improve her circulation with the mixed arterial venous insufficiency.  Continue with the foam boot and continue with Santyl dressing changes with home health nursing 3 times a week.  I will follow-up in the office in 1 week and anticipate transitioning to medical compression stockings.

## 2018-01-11 NOTE — Progress Notes (Signed)
Physical Therapy Wound Treatment Patient Details  Name: CELSEY ASSELIN MRN: [id number] Date of Birth: 07-20-1966  Today's Date: 01/11/2018 Time: 1017-1055 Time Calculation (min): 38 min  Subjective  Subjective: I think I'm going home today, Patient and Family Stated Goals: heal wound Date of Onset: (unsure/ a couple months)  Pain Score: Pt premedicated prior to treatment, however complains of 8/10 pain with treatment. Wound Assessment  Pressure Injury 01/04/18 Unstageable - Full thickness tissue loss in which the base of the ulcer is covered by slough (yellow, tan, gray, green or brown) and/or eschar (tan, brown or black) in the wound bed. Yellow (Active)  Wound Image   01/06/2018 11:00 AM  Dressing Type Moist to dry;Other (Comment);Compression wrap;Gauze (Comment) 01/11/2018  1:00 PM  Dressing Clean;Dry;Intact;Changed 01/11/2018  1:00 PM  Dressing Change Frequency Daily 01/11/2018  1:00 PM  State of Healing Early/partial granulation 01/11/2018  1:00 PM  Site / Wound Assessment Yellow;Brown 01/11/2018  1:00 PM  % Wound base Red or Granulating 10% 01/11/2018  1:00 PM  % Wound base Yellow/Fibrinous Exudate 90% 01/11/2018  1:00 PM  % Wound base Black/Eschar 0% 01/11/2018  1:00 PM  % Wound base Other/Granulation Tissue (Comment) 0% 01/04/2018  9:03 PM  Peri-wound Assessment Intact;Pink;Edema;Erythema (blanchable);Denuded 01/11/2018  1:00 PM  Wound Length (cm) 3.5 cm 01/06/2018 11:00 AM  Wound Width (cm) 3 cm 01/06/2018 11:00 AM  Wound Depth (cm) 0.8 cm 01/06/2018 11:00 AM  Wound Surface Area (cm^2) 10.5 cm^2 01/06/2018 11:00 AM  Wound Volume (cm^3) 8.4 cm^3 01/06/2018 11:00 AM  Undermining (cm) 1-3 01/11/2018  1:00 PM  Margins Unattached edges (unapproximated) 01/11/2018  1:00 PM  Drainage Amount Minimal 01/11/2018  1:00 PM  Drainage Description Serosanguineous;Odor 01/11/2018  1:00 PM  Treatment Debridement (Selective);Hydrotherapy (Pulse lavage);Packing (Saline gauze) 01/11/2018  1:00 PM   Santyl applied to necrotic  tissue   Hydrotherapy Pulsed lavage therapy - wound location:  R tibia  Pulsed Lavage with Suction (psi): 8 psi(8-12 psi) Pulsed Lavage with Suction - Normal Saline Used: 1000 mL Pulsed Lavage Tip: Tip with splash shield Selective Debridement Selective Debridement - Location: R tibia Selective Debridement - Tools Used: Forceps;Scissors Selective Debridement - Tissue Removed: yellow slough loosened by pulsed lavage   Wound Assessment and Plan  Wound Therapy - Assess/Plan/Recommendations Wound Therapy - Clinical Statement: Dressing removed by physican prior to treatment. Pt continues to have undermining in all directions with moderate amounts of yellow friable slough. Pt educated on the need to remove as much slough as possible before discharge home.   Wound Therapy - Functional Problem List: decreased healing capacity due to medication  Factors Delaying/Impairing Wound Healing: Diabetes Mellitus;Multiple medical problems;Polypharmacy Hydrotherapy Plan: Debridement;Patient/family education;Pulsatile lavage with suction Wound Therapy - Frequency: 6X / week Wound Therapy - Follow Up Recommendations: Home health RN Wound Plan: see above  Wound Therapy Goals- Improve the function of patient's integumentary system by progressing the wound(s) through the phases of wound healing (inflammation - proliferation - remodeling) by: Decrease Necrotic Tissue to: 30 Decrease Necrotic Tissue - Progress: Progressing toward goal Increase Granulation Tissue to: 70 Increase Granulation Tissue - Progress: Progressing toward goal Goals/treatment plan/discharge plan were made with and agreed upon by patient/family: Yes Time For Goal Achievement: 7 days Wound Therapy - Potential for Goals: Fair  Goals will be updated until maximal potential achieved or discharge criteria met.  Discharge criteria: when goals achieved, discharge from hospital, MD decision/surgical intervention, no progress towards goals,  refusal/missing three consecutive treatments without notification or medical  reason.  GP    Dani Gobble. Migdalia Dk PT, DPT Acute Rehabilitation  [phone] Pager [phone]   [place] 01/11/2018, 1:51 PM

## 2018-01-12 ENCOUNTER — Telehealth (INDEPENDENT_AMBULATORY_CARE_PROVIDER_SITE_OTHER): Payer: Self-pay | Admitting: Orthopedic Surgery

## 2018-01-12 NOTE — Telephone Encounter (Signed)
Okay on orders. 

## 2018-01-12 NOTE — Telephone Encounter (Signed)
[facility]  Home Health orders  per Dr.Duda  3 times a week   Pt husband called need order placed

## 2018-01-12 NOTE — Telephone Encounter (Signed)
yes

## 2018-01-13 IMAGING — DX DG CHEST 1V PORT
1 series · 1 of 1 positions shown · non-contrast
Comparison: Earlier today at 3993 hours.

CLINICAL DATA: Central line placement

EXAM:
PORTABLE CHEST 1 VIEW

[chest ap]
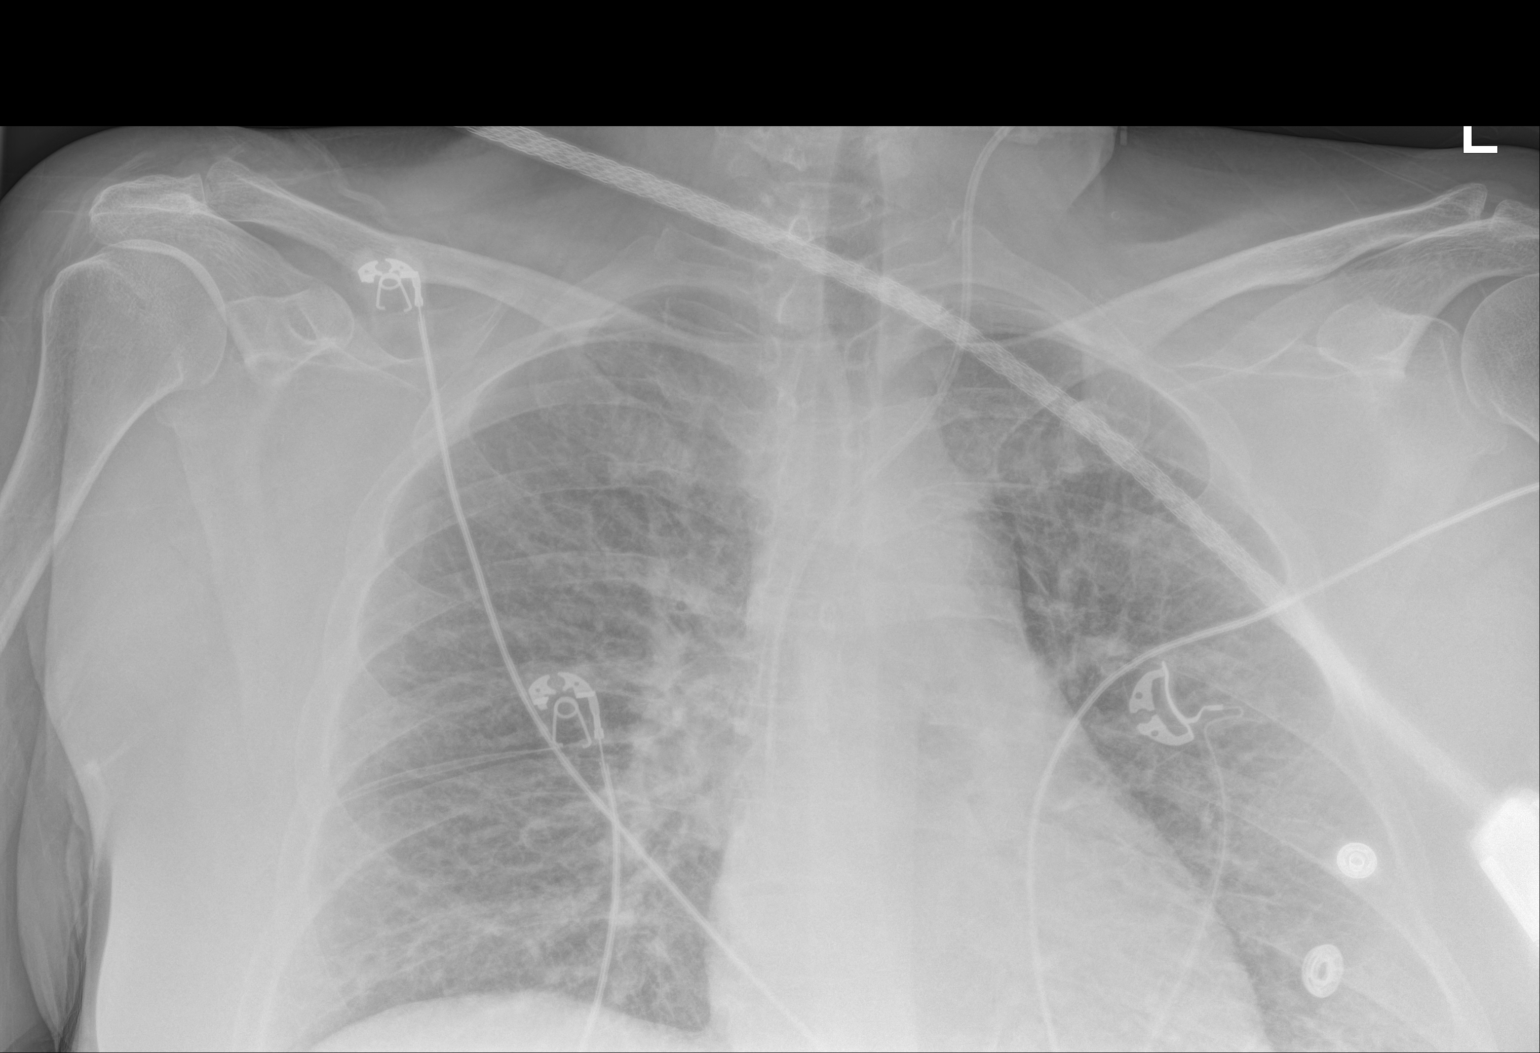

[1 of 1 positions shown; findings below may reference images not displayed]

FINDINGS: 0559 hours. Left internal jugular line terminates at the low SVC.
Midline trachea. Normal heart size. The lower chest is partially
excluded. Pulmonary interstitial thickening is likely chronic,
accentuated by lower lung volumes on the current exam. No
pneumothorax. Limited evaluation for pleural fluid. No lobar
consolidation.
IMPRESSION: Left-sided central line terminating at the low SVC, without
pneumothorax.

Exclusion of the lower chest.

Interstitial thickening which appears increased, possibly due to
lower lung volumes on the current exam. Pulmonary venous congestion
cannot be excluded.

## 2018-01-13 IMAGING — DX DG CHEST 1V PORT
1 series · 1 of 1 positions shown · non-contrast
Comparison: 11/02/2015

CLINICAL DATA: Shortness of breath, weakness

EXAM:
PORTABLE CHEST 1 VIEW

[chest ap]
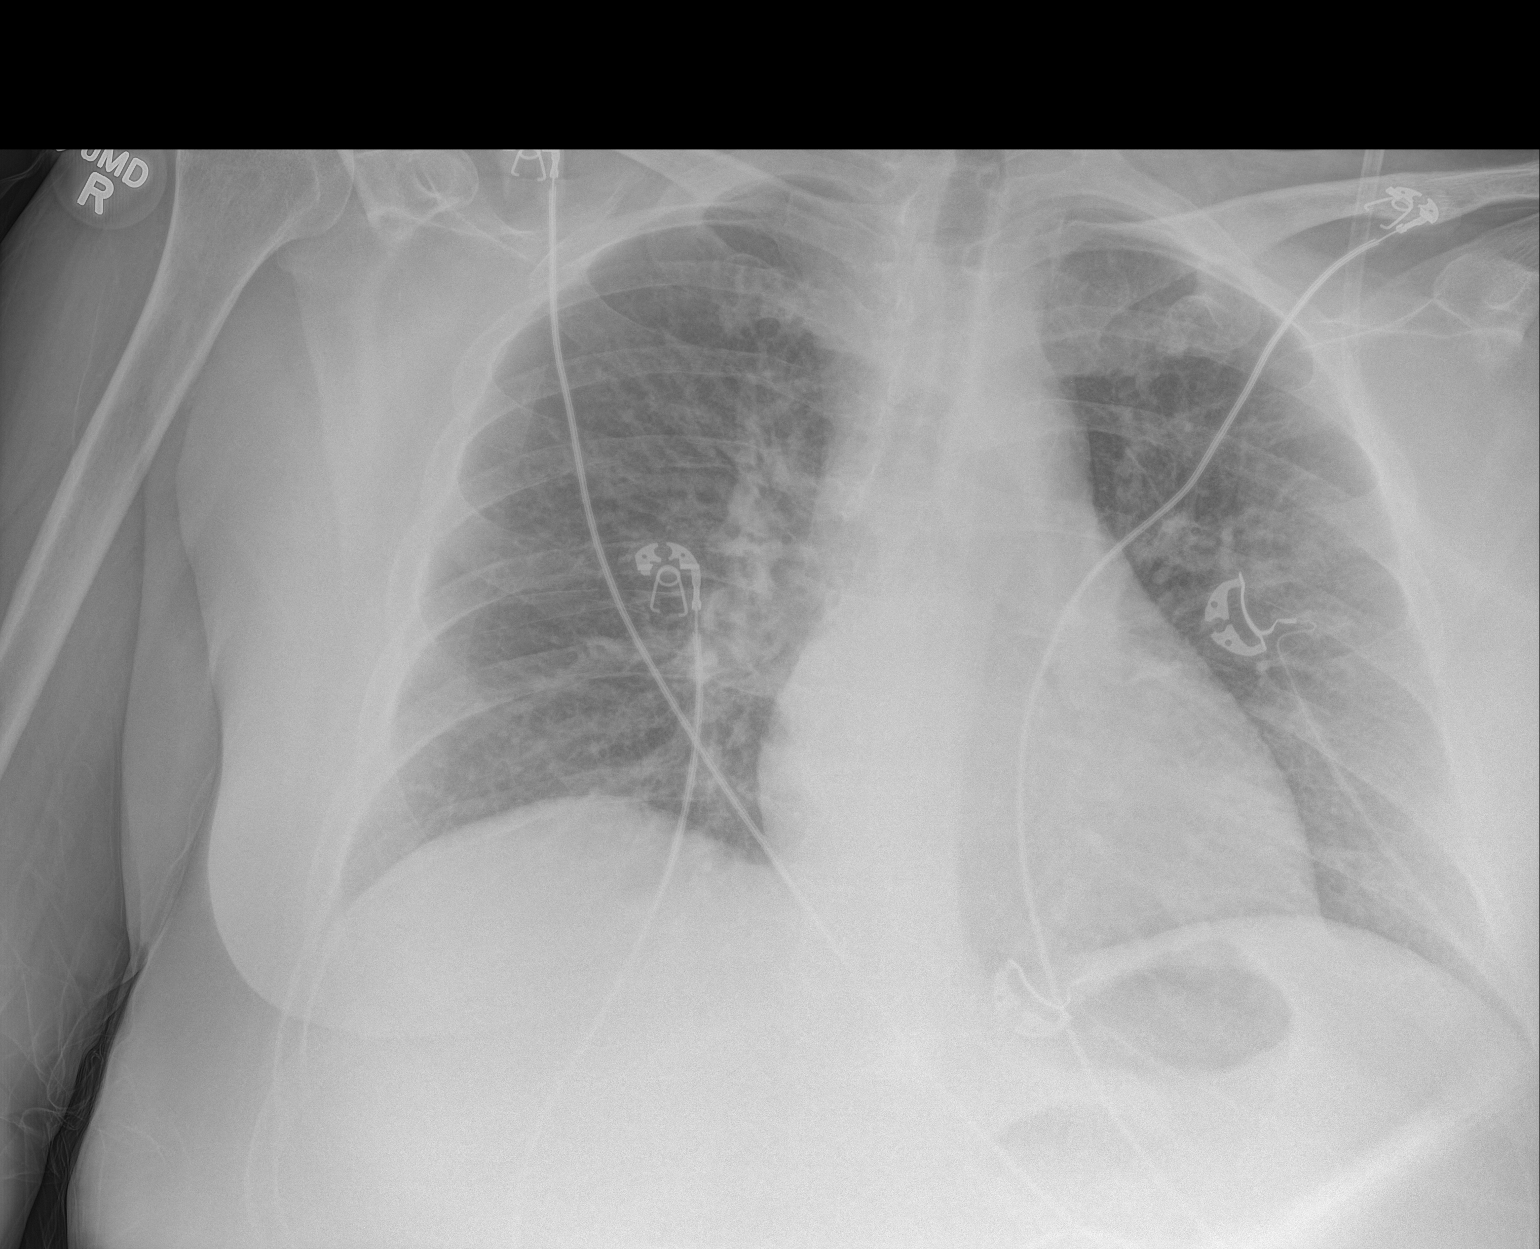

[1 of 1 positions shown; findings below may reference images not displayed]

FINDINGS: Increased interstitial markings. No focal consolidation or frank
interstitial edema. No pleural effusion or pneumothorax.

The heart is normal in size.
IMPRESSION: No evidence of acute cardiopulmonary disease.

## 2018-01-14 NOTE — Telephone Encounter (Signed)
Per patients husband this has been done already.

## 2018-01-19 NOTE — Discharge Summary (Signed)
Physician Discharge Summary  [name] ZHY:[id number] DOB: 09-17-1965 DOA: 01/04/2018  PCP: Dois Davenport, MD  Admit date: 01/04/2018 Discharge date: 01/11/2018  Time spent: 35 minutes  Recommendations for Outpatient Follow-up:  1. Ortho Dr.Duda in 1 week 2. Home health RN, PT   Discharge Diagnoses:  Principal Problem:   Wound cellulitis Active Problems:   Immunosuppression (HCC)   Hypothyroidism   Essential hypertension   GERD (gastroesophageal reflux disease)   Diarrhea   Type 1 diabetes mellitus with diabetic foot ulcer (HCC)   Lower extremity cellulitis   Status post kidney transplant   Gangrene of right foot (HCC)   Venous ulcer of right leg (HCC)   Diabetic ulcer of lower leg (HCC)   Discharge Condition:stable  Diet recommendation: diabetic  Filed Weights   01/09/18 2019 01/10/18 2038 01/11/18 0500  Weight: 76.5 kg (168 lb 10.4 oz) 77.2 kg (170 lb 4.8 oz) 76.4 kg (168 lb 8 oz)    History of present illness:  52 year old female with history of chronic kidney disease stage 3, history of renal transplant on immunosuppression( cadaveric renal transplant at [facility] in 2011) type 2 diabetes, chronic right leg wound presented to the emergency room with increasing swelling pain tenderness and discharge. -In addition she had diarrhea since arrival in the emergency room  Assessment and Plan:  1. Subacute Right leg wound with secondary infection/abscess -extensive subcutaneous fluid collection noted on MRI -Large R leg wound followed at the wound center for 1 month, she is immunosuppressed secondary to transplant meds, previously Rx with Doxycycline and now Bactrim -treated with Vanc and Zosyn initially, changed IV vancomycin to daptomycin secondary to renal failure,  -VVS consulted, arterial duplex is abnormal, completed CO2 arteriogram 5/15, Arteriogram reviewed No significant arterial occlusive disease only calcification -Dr.Duda was reconsulted, he recommended  Trental, foam boot and santyl dressing and FU in office in 1 week  2. AKI on CKD stage IV -prior history of renal transplant on chronic immunosuppressive therapy -baseline creatinine is 1.8 -I suspect acute worsening of her renal failure was secondary to recent Bactrim use and some ATN in setting of above infection, now down to 2.4 -given IV fluids prior to arteriogram -Renal dose medications and hold nephrotoxic meds -changed vancomycin to daptomycin -Prograf trough is 5.6 -renal consulted this admission, creatinine improved to 2.3 from peak of 2.8 this admission -FU with Dr.COlodonato this admission  3. Hypertension -Continue Coreg, hold amlodipine and Lasix   4. Diarrhea -Likely secondary to recent antibiotic use -C. Difficile PCR negative -improved  5. Status post renal Transplant -Continue Prednisone 5 mg,CellCept and Prograf -prograf level ok at 5.6  6. Type 1 diabetes mellitus -with hypoglycemia, had cutdown insulin 7030 dose, Then stopped, -Continue sliding scale insulin for now -cut down  lantus, SSI      Discharge Exam: Vitals:   01/11/18 0755 01/11/18 1211  BP: 130/65 (!) 151/86  Pulse: 67 81  Resp: 18 18  Temp:  98 F (36.7 C)  SpO2: 98% 100%    General: AAOX3 Cardiovascular: S1S2/RRR Respiratory: CTAB  Discharge Instructions   Discharge Instructions    Diet - low sodium heart healthy   Complete by:  As directed    Diet Carb Modified   Complete by:  As directed    Increase activity slowly   Complete by:  As directed      Allergies as of 01/11/2018      Reactions   Latex Itching   Ace Inhibitors Cough  Adhesive [tape] Itching, Other (See Comments)   Please use paper tape   Lisinopril Cough      Medication List    STOP taking these medications   furosemide 40 MG tablet Commonly known as:  LASIX   sulfamethoxazole-trimethoprim 800-160 MG tablet Commonly known as:  BACTRIM DS,SEPTRA DS     TAKE these medications    acetaminophen 325 MG tablet Commonly known as:  TYLENOL Take 1 tablet (325 mg total) by mouth every 6 (six) hours as needed for mild pain, moderate pain or headache.   amLODipine 5 MG tablet Commonly known as:  NORVASC Take 1 tablet (5 mg total) by mouth daily.   carvedilol 25 MG tablet Commonly known as:  COREG Take 1 tablet (25 mg total) by mouth 2 (two) times daily with a meal.   collagenase ointment Commonly known as:  SANTYL Apply topically daily.   doxycycline 100 MG tablet Commonly known as:  VIBRA-TABS Take 1 tablet (100 mg total) by mouth every 12 (twelve) hours. For 7days   HYDROcodone-acetaminophen 5-325 MG tablet Commonly known as:  NORCO/VICODIN Take 1 tablet by mouth every 6 (six) hours as needed for moderate pain.   lip balm Oint Apply 1 application topically as needed for lip care.   mycophenolate 250 MG capsule Commonly known as:  CELLCEPT Take 500 mg by mouth 2 (two) times daily.   NOVOLIN 70/30 FLEXPEN RELION (70-30) 100 UNIT/ML PEN Generic drug:  Insulin Isophane & Regular Human Inject 20 Units into the skin 2 (two) times daily before a meal. PER SLIDING SCALE   pentoxifylline 400 MG CR tablet Commonly known as:  TRENTAL Take 1 tablet (400 mg total) by mouth 3 (three) times daily with meals.   predniSONE 5 MG tablet Commonly known as:  DELTASONE Take 5 mg by mouth daily.   senna-docusate 8.6-50 MG tablet Commonly known as:  Senokot-S Take 1 tablet by mouth at bedtime.   sodium bicarbonate 650 MG tablet Take 2 tablets (1,300 mg total) by mouth 2 (two) times daily.   SYNTHROID 137 MCG tablet Generic drug:  levothyroxine Take 137 mcg by mouth daily.   PROGRAF 0.5 MG capsule Generic drug:  tacrolimus Take 0.5 mg by mouth at bedtime.   tacrolimus 1 MG capsule Commonly known as:  PROGRAF Take 1 mg by mouth daily.   vitamin B-12 100 MCG tablet Commonly known as:  CYANOCOBALAMIN Take 1 tablet (100 mcg total) by mouth daily.       Allergies  Allergen Reactions  . Latex Itching  . Ace Inhibitors Cough  . Adhesive [Tape] Itching and Other (See Comments)    Please use paper tape  . Lisinopril Cough   Follow-up Information    Nadara Mustard, MD In 1 week.   Specialty:  Orthopedic Surgery Why:  Office will call Contact information: [address] [place] [address] [phone]        Dois Davenport, MD. Go on 01/14/2018.   Specialty:  Family Medicine Why:  :20pm Contact information: [address] [place] [address] [phone]            The results of significant diagnostics from this hospitalization (including imaging, microbiology, ancillary and laboratory) are listed below for reference.    Significant Diagnostic Studies: Dg Tibia/fibula Right  Result Date: 01/04/2018 CLINICAL DATA:  Right lower leg swelling for the past 2 months. Clinical concern for gas-forming infection. EXAM: RIGHT TIBIA AND FIBULA - 2 VIEW COMPARISON:  01/01/2018. FINDINGS: Diffuse soft tissue swelling. Diffuse arterial calcifications. No soft tissue gas, bone destruction or periosteal reaction. An anterior mid lower leg soft tissue ulceration is again demonstrated. Diffuse osteopenia. IMPRESSION: 1. Anterior soft tissue ulceration without evidence of underlying osteomyelitis. 2. No soft tissue gas. 3. Extensive atheromatous arterial calcifications. Electronically Signed   By: Beckie Salts M.D.   On: 01/04/2018 14:47   Dg Tibia/fibula Right  Result Date: 01/01/2018 CLINICAL DATA:  Open wound along the anterior and lateral aspect of the mid right leg. EXAM: RIGHT TIBIA AND FIBULA - 2 VIEW COMPARISON:  None. FINDINGS: Osteopenic appearance of the tibia and fibula. No fracture bone destruction. No focal soft tissue mass or mineralization. Tibial arteriosclerosis is identified. Soft tissue ulceration is noted anteriorly along the mid right leg measuring 2.7 cm and craniocaudad span. The reported  wound along the lateral aspect is not radiographically apparent. IMPRESSION: Osteopenic appearance of the right tibia and fibula without fracture or bone destruction. Soft tissue ulceration anterior mid leg. Electronically Signed   By: Tollie Eth M.D.   On: 01/01/2018 19:25   Mr Tibia Fibula Right Wo Contrast  Result Date: 01/05/2018 CLINICAL DATA:  52 year old female with history of chronic renal disease, stage IV with renal transplant on immunosuppression, type 2 diabetes and chronic right leg wound presenting with increased pain and swelling as well as discharge. EXAM: MRI OF LOWER RIGHT EXTREMITY WITHOUT CONTRAST TECHNIQUE: Multiplanar, multisequence MR imaging of the right leg was performed. No intravenous contrast was administered. COMPARISON:  Radiographs of the right leg from 01/04/2018 FINDINGS: Bones/Joint/Cartilage Mild degenerative joint space narrowing of the included knees. No focal chondral defects. No significant joint effusion. No marrow signal abnormalities of the included tibia and fibula of either leg to suggest acute osteomyelitis. No acute fracture bone destruction. Ligaments Noncontributory Muscles and Tendons Generalized muscle atrophy and myositis of both included legs. Soft tissues Right leg: There is an approximately 2.4 x 1.6 x 0.9 cm (CC x AP x Trv) ulcer along the anterolateral aspect of the right mid leg. This is associated with a thin tracking layer of subcutaneous fluid spanning at least 10.6 x 4.4 x 0.7 cm in CC by AP by transverse, series 4/19 and series 6/30. Similar ovoid linear fluid collections are seen interposed between the gastrocnemius and soleus muscles of the right leg, the more lateral measuring at least 11.6 x 2.7 x 1.2 cm and the more medial measuring at least 11.6 x 2.1 x 1.4 cm. Left leg: Large subcutaneous tracking fluid collection measuring 22.4 x 5.8 x 2.2 cm, series 5/45 and series 4/6 overlying the gastrocnemius muscles. Additional tracking fluid measuring  18.3 x 5.6 x 1.4 cm is seen interposed between the gastrocnemius and soleus muscles. IMPRESSION: 1. 2.4 x 1.6 x 0.9 cm ulcer involving the anterolateral aspect of the right mid leg associated with a thin tracking layer of soft cutaneous fluid measuring 10.6 x 4.4 x 0.7 cm. Two additional tracking fluid collections are seen interposed between the gastrocnemius and soleus muscles of the right leg measuring 11.6 x 2.7 x 1.2 cm laterally and 11.6 x 2.1 x 1.4 cm medially. 2. Tracking fluid collections in the subcutaneous soft tissues of the posterior left leg measuring 22.4 x 5.8 x 2.2 cm with a slightly deeper tracking fluid collection interposed between the gastrocnemius and soleus muscles measuring 18.3 x 5.6 x 1.4 cm. 3. No associated marrow signal abnormalities to indicate acute osteomyelitis of either included leg. 4. Generalized diffuse soft tissue  and intramuscular edema compatible with cellulitis/third spacing of fluid and myositis of both legs. Electronically Signed   By: Tollie Eth M.D.   On: 01/05/2018 21:27    Microbiology: No results found for this or any previous visit (from the past 240 hour(s)).   Labs: Basic Metabolic Panel: No results for input(s): NA, K, CL, CO2, GLUCOSE, BUN, CREATININE, CALCIUM, MG, PHOS in the last 168 hours. Liver Function Tests: No results for input(s): AST, ALT, ALKPHOS, BILITOT, PROT, ALBUMIN in the last 168 hours. No results for input(s): LIPASE, AMYLASE in the last 168 hours. No results for input(s): AMMONIA in the last 168 hours. CBC: No results for input(s): WBC, NEUTROABS, HGB, HCT, MCV, PLT in the last 168 hours. Cardiac Enzymes: No results for input(s): CKTOTAL, CKMB, CKMBINDEX, TROPONINI in the last 168 hours. BNP: BNP (last 3 results) Recent Labs    05/09/17 0853 08/31/17 1535 11/14/17 1313  BNP 282.1* 1,022.8* 958.8*    ProBNP (last 3 results) No results for input(s): PROBNP in the last 8760 hours.  CBG: No results for input(s): GLUCAP  in the last 168 hours.     Signed:  Zannie Cove MD.  [facility] 01/19/2018, 3:18 PM

## 2018-01-27 IMAGING — CR DG CHEST 2V
2 series · 2 of 2 positions shown · non-contrast
Comparison: Chest radiograph March 27, 2016

CLINICAL DATA: Renal transplant, abdominal ecchymosis. Altered
mental status. History of hypertension, diabetes and end-stage renal
disease.

EXAM:
CHEST  2 VIEW

[chest lat]
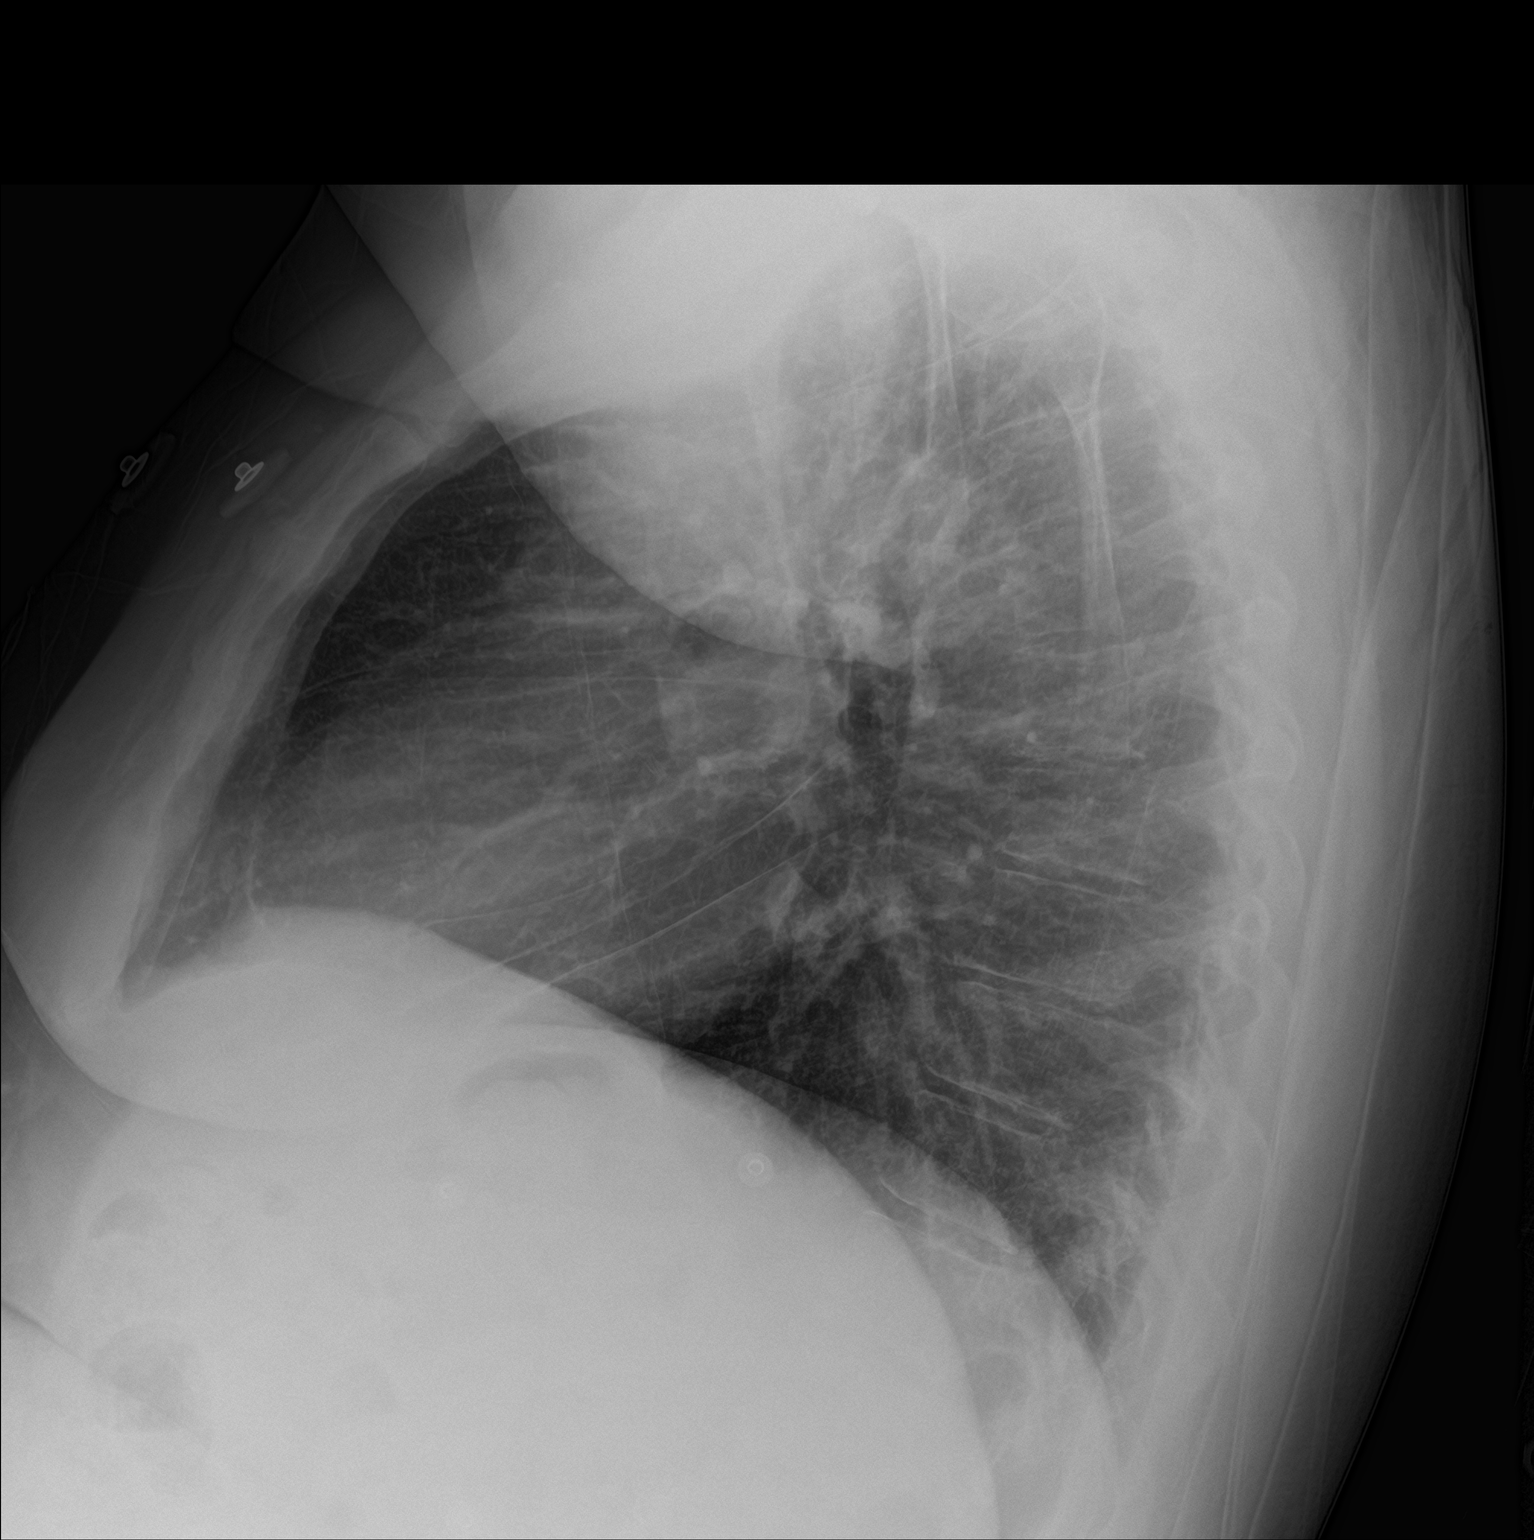

[chest ap]
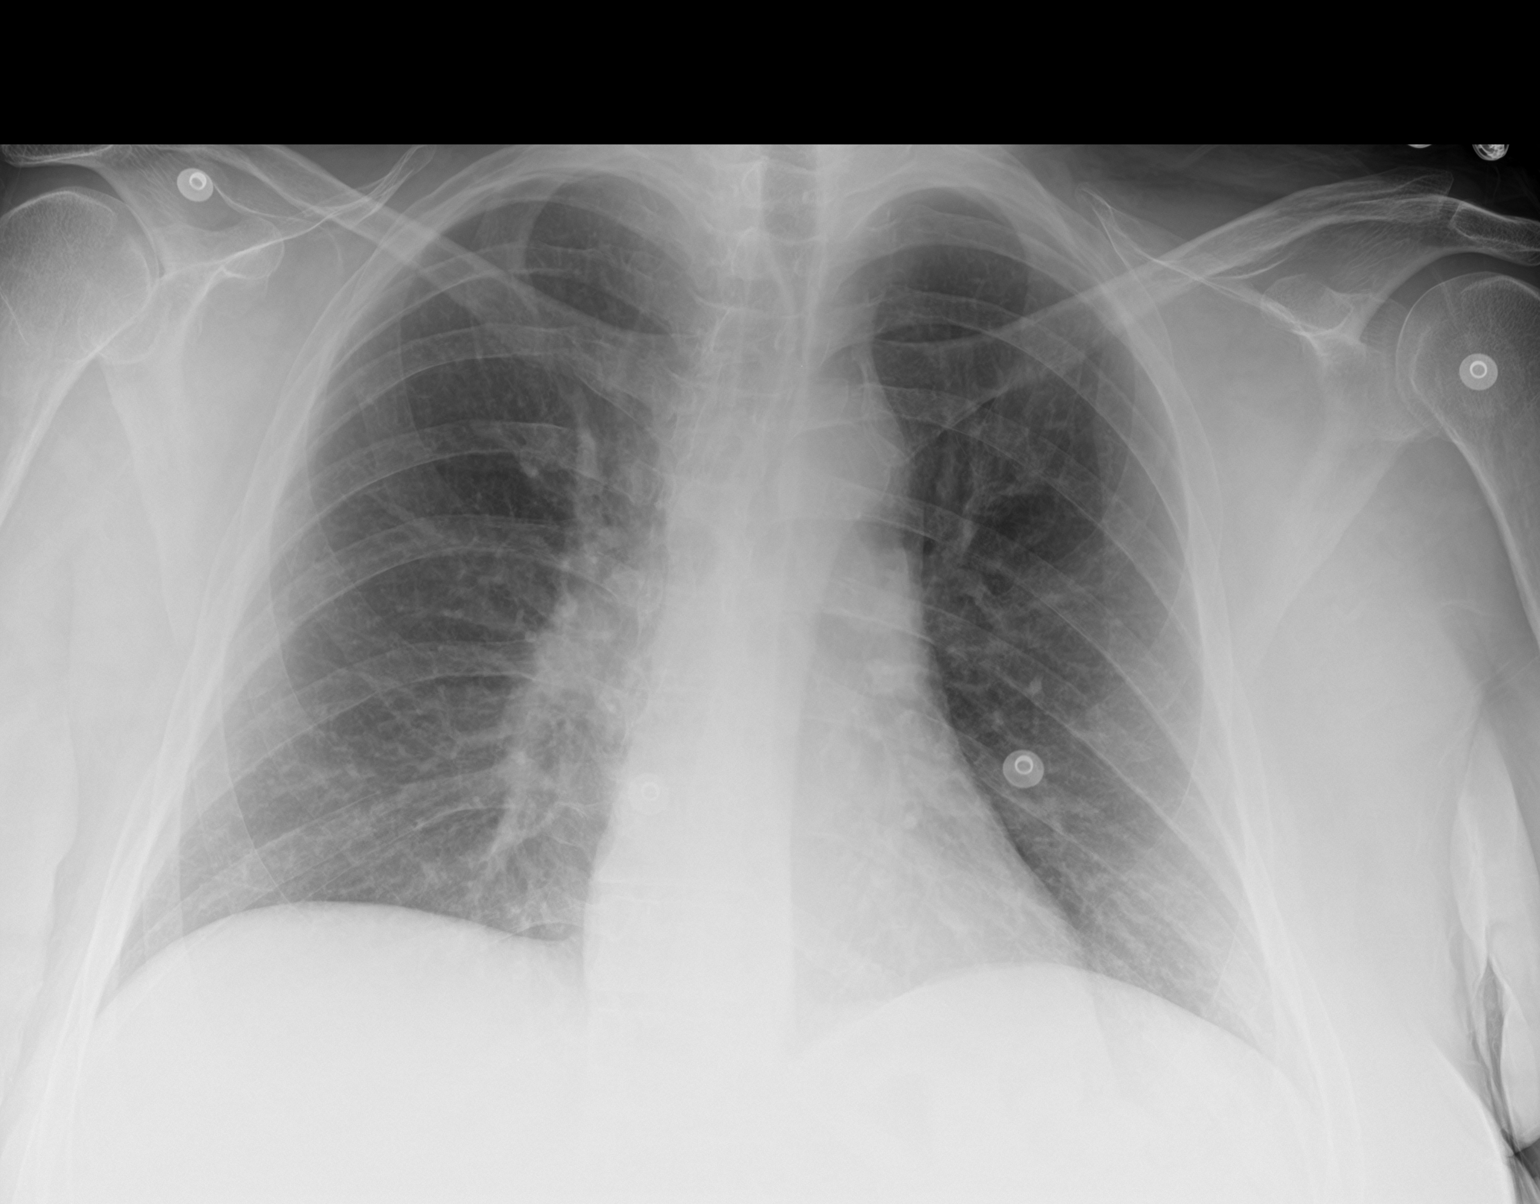

[2 of 2 positions shown; findings below may reference images not displayed]

FINDINGS: Cardiomediastinal silhouette is normal. The lungs are clear without
pleural effusions or focal consolidations. Trachea projects midline
and there is no pneumothorax. Soft tissue planes and included
osseous structures are non-suspicious. Interval removal of LEFT
internal jugular central venous catheter.
IMPRESSION: No acute cardiopulmonary process.

## 2018-01-27 IMAGING — CT CT HEAD W/O CM
3 series · 15 of 47 positions shown, 18 images · non-contrast
Comparison: Head CT dated 10/02/2009

CLINICAL DATA: 50-year-old female with altered mental status

EXAM:
CT HEAD WITHOUT CONTRAST
TECHNIQUE: Contiguous axial images were obtained from the base of the skull
through the vertex without intravenous contrast.

[Series 504: head 5.0 h30s · axial · 0.42mm/px · z∈[-243,-98]mm · 9 of 35 slices shown, 12 images]
[im 3/35  brain]
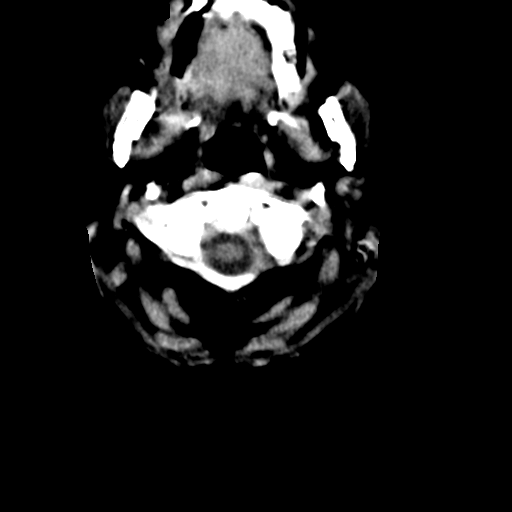
[im 3/35  bone]
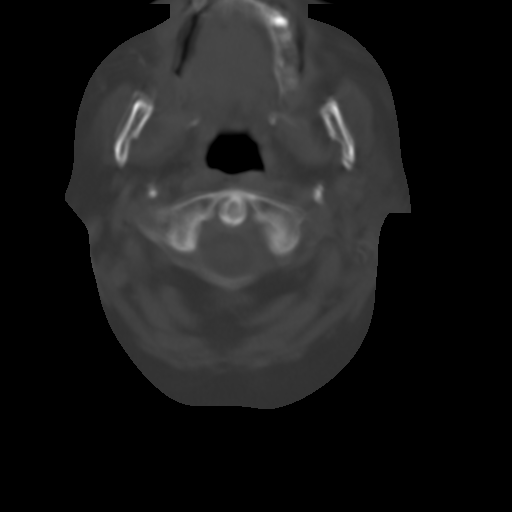
[im 6/35  brain]
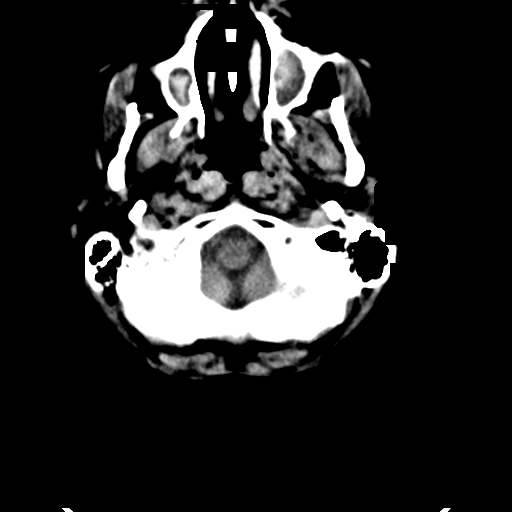
[im 10/35  brain]
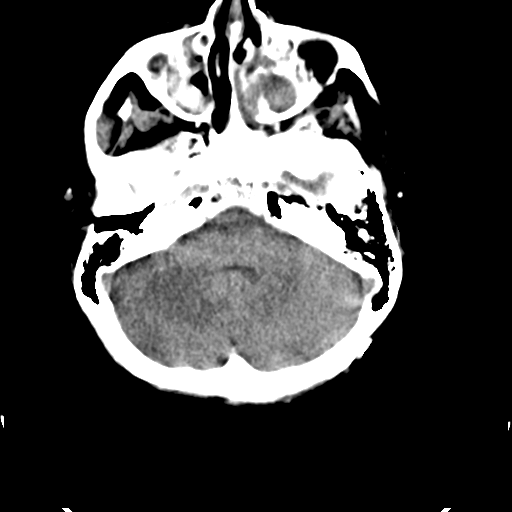
[im 13/35  brain]
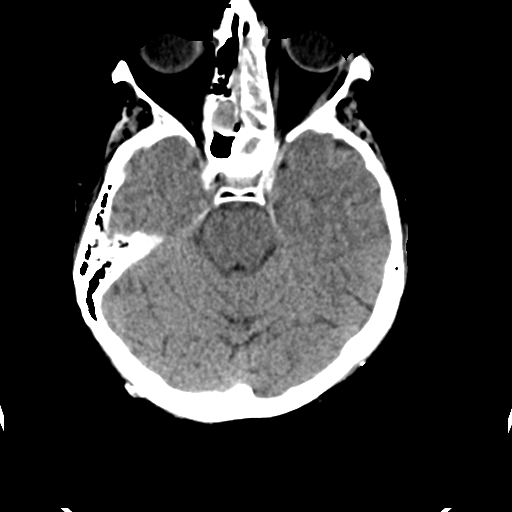
[im 18/35  brain]
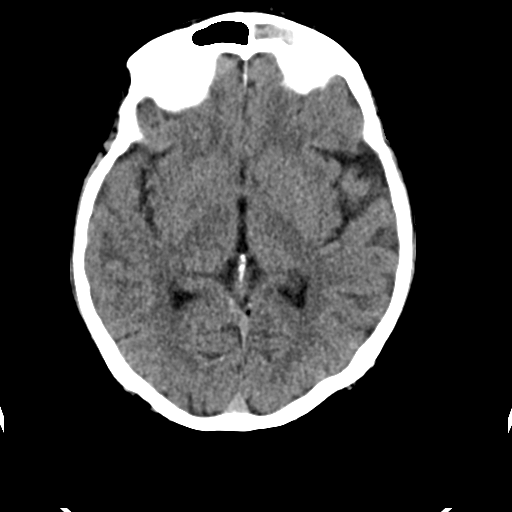
[im 18/35  bone]
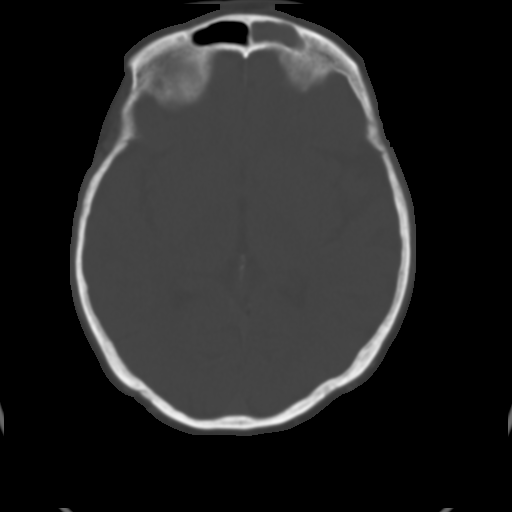
[im 22/35  brain]
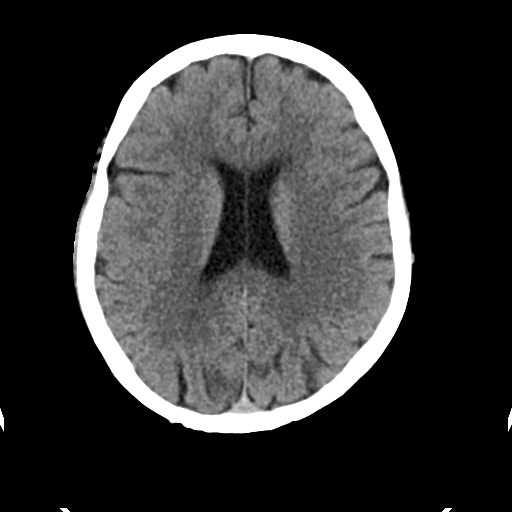
[im 25/35  brain]
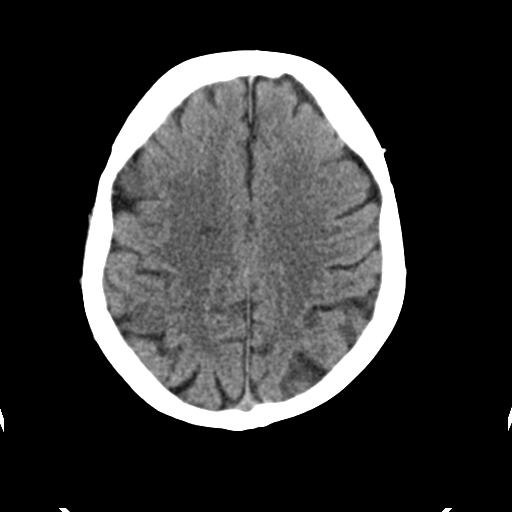
[im 29/35  brain]
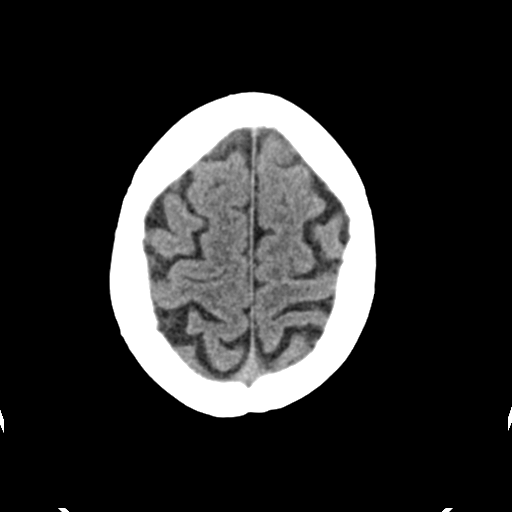
[im 32/35  brain]
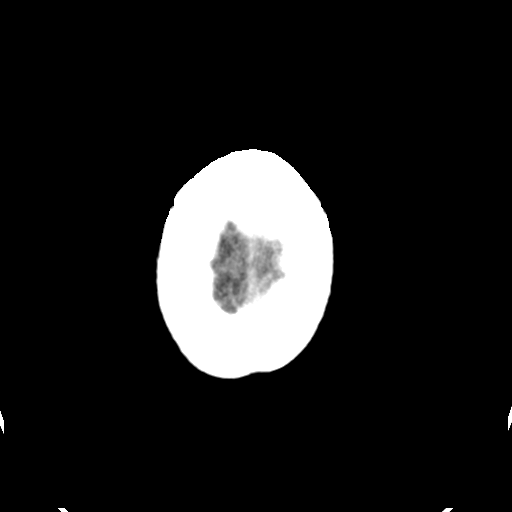
[im 32/35  bone]
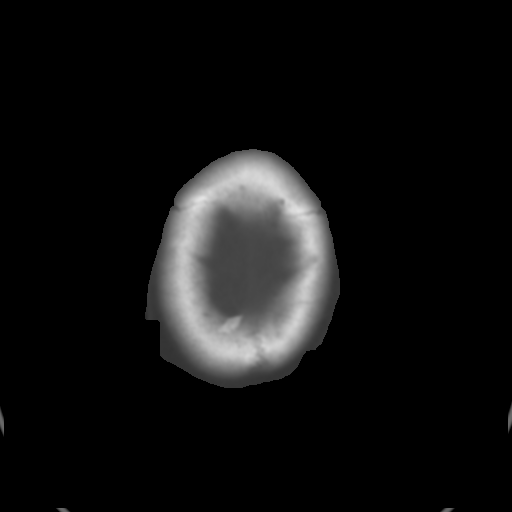

[Series 506: head 3.0 mpr · coronal · 0.32mm/px · 3 of 63 slices shown (1 of 2)]
[im 21/63  brain]
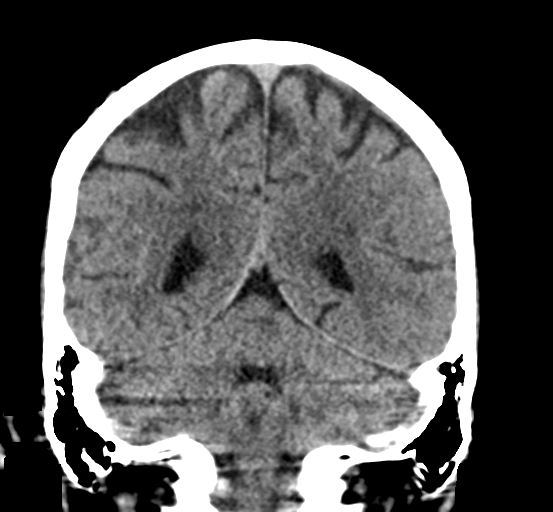
[im 28/63  brain]
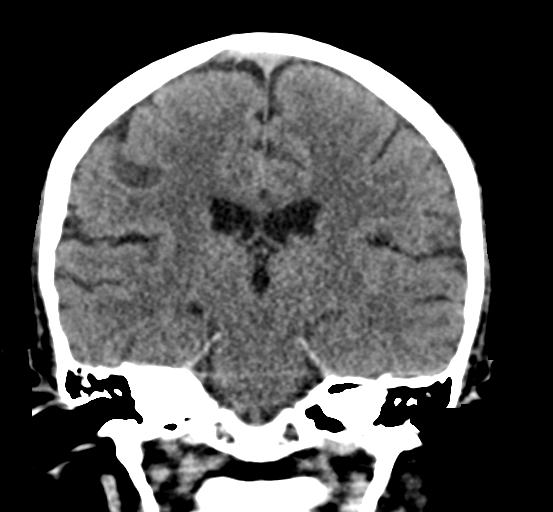
[im 35/63  brain]
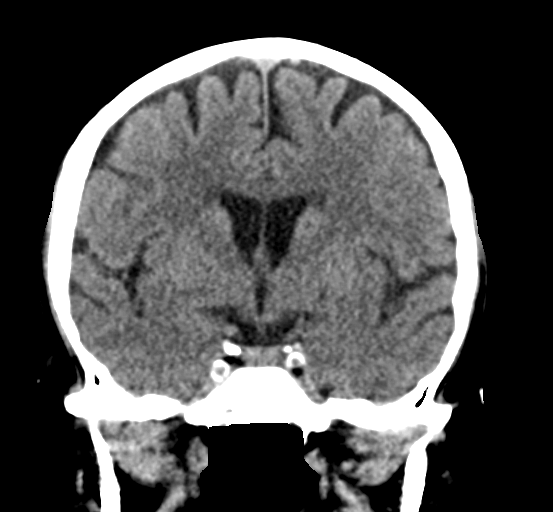

[Series 507: head 3.0 mpr · sagittal · 0.31mm/px · 3 of 53 slices shown (2 of 2)]
[im 18/53  brain]
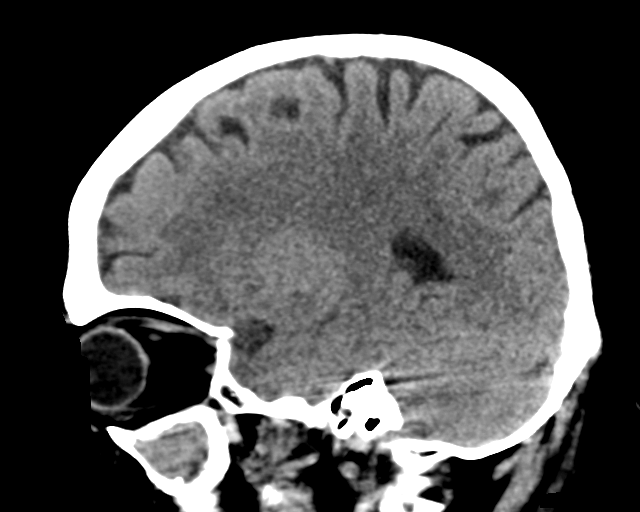
[im 27/53  brain]
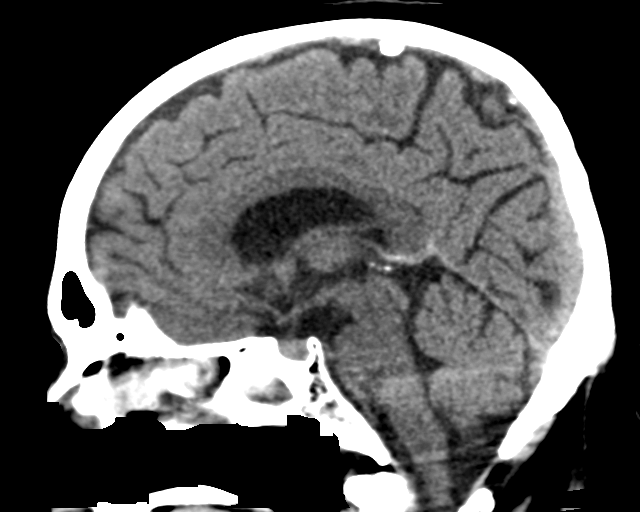
[im 35/53  brain]
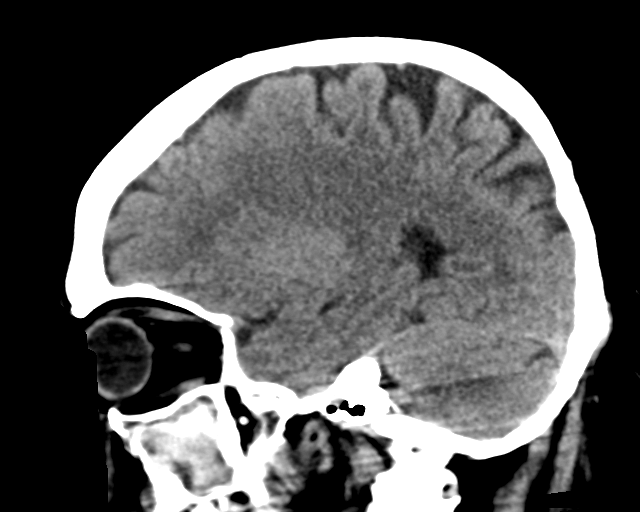

[15 of 47 positions shown; findings below may reference images not displayed]

FINDINGS: The ventricles and sulci are appropriate in size for the patient's
age. There is no intracranial hemorrhage. No mass effect or midline
shift identified. The gray-white matter differentiation is
preserved. There is no extra-axial fluid collection.

There is extensive mucoperiosteal thickening of paranasal sinuses
with complete opacification of the maxillary sinuses, left sphenoid
and frontal sinuses, and near complete opacification of the ethmoid
air cells. There is a remodeling of the wall of the maxillary
sinuses compatible with chronic sinusitis. Correlation with clinical
exam recommended. MRI without and with contrast may provide better
evaluation and exclude underlying mass if clinically indicated. The
mastoid air cells are clear. The calvarium is intact.
IMPRESSION: No acute intracranial pathology.

Severe, chronic appearing paranasal sinus disease with remodeling of
the walls of the maxillary sinuses. Clinical correlation is
recommended.

## 2018-01-27 IMAGING — MR MR HEAD W/O CM
9 of 10 series · 40 of 48 positions shown · non-contrast
Comparison: Head CT from earlier today

CLINICAL DATA: Altered mental status

EXAM:
MRI HEAD WITHOUT CONTRAST
TECHNIQUE: Multiplanar, multiecho pulse sequences of the brain and surrounding
structures were obtained without intravenous contrast.

[Series 3: FLAIR · sagittal · 5.0mm · 0.47mm/px · 3 of 23 slices shown (1 of 2)]
[im 1/23]
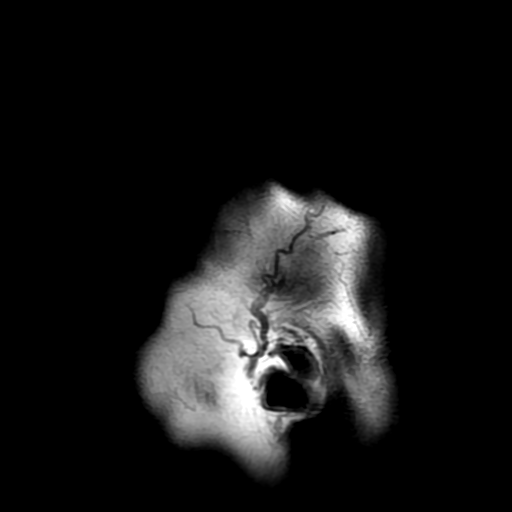
[im 12/23]
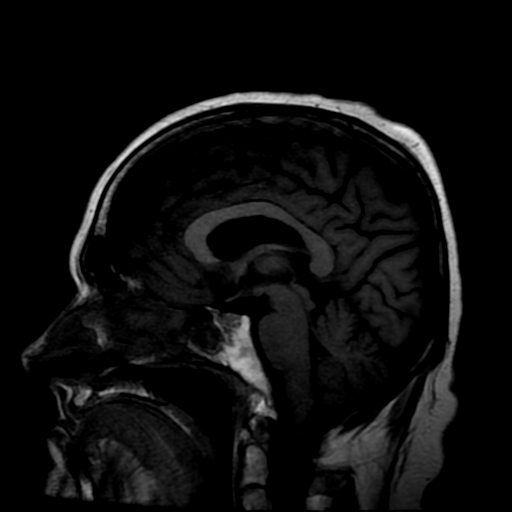
[im 23/23]
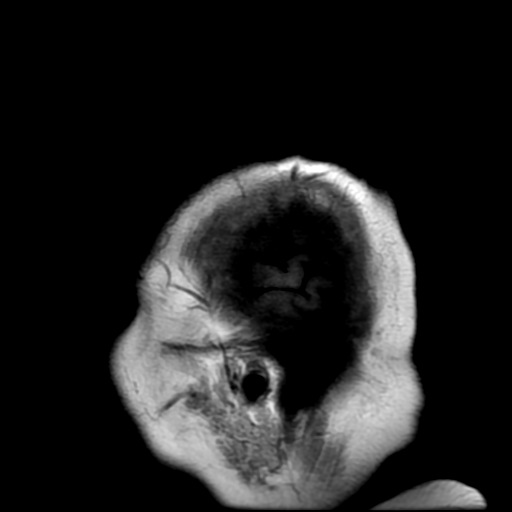

[Series 5: DWI · axial · 3.0mm · 0.94mm/px · z∈[-158,-12]mm · 9 of 100 slices shown (1 of 2)]
[im 1/100]
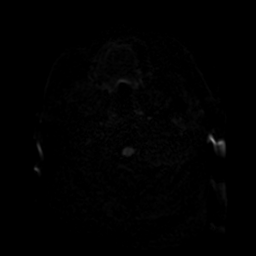
[im 19/100]
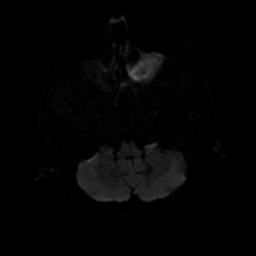
[im 28/100]
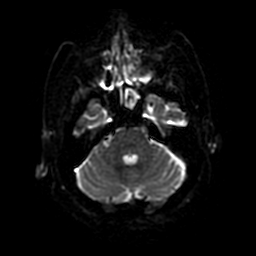
[im 46/100]
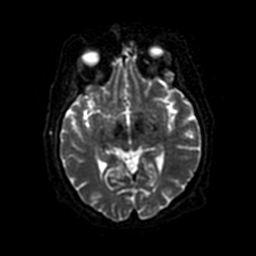
[im 55/100]
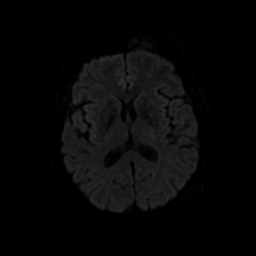
[im 73/100]
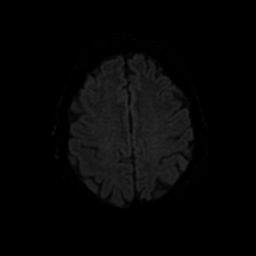
[im 82/100]
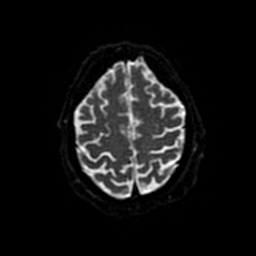
[im 91/100]
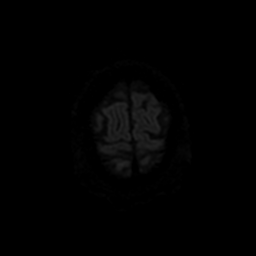
[im 100/100]
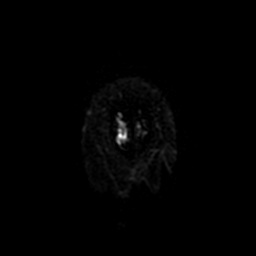

[Series 7: T2 · axial · 5.0mm · 0.47mm/px · z∈[-157,-13]mm · 3 of 25 slices shown (1 of 2)]
[im 1/25]
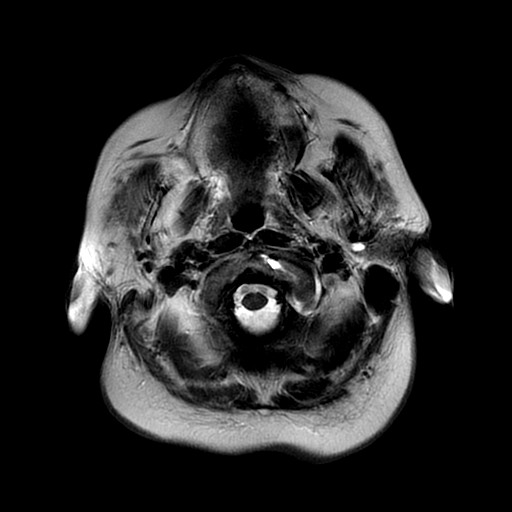
[im 13/25]
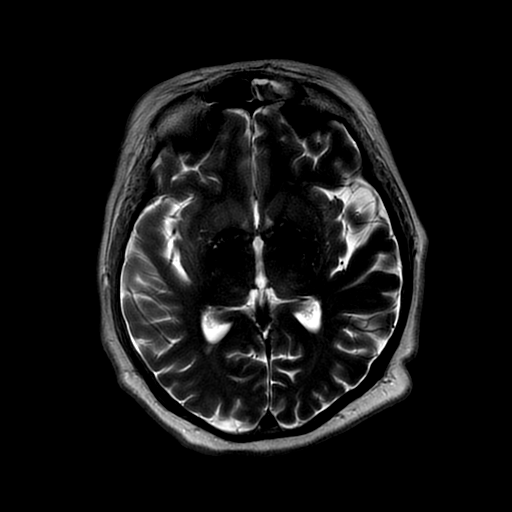
[im 25/25]
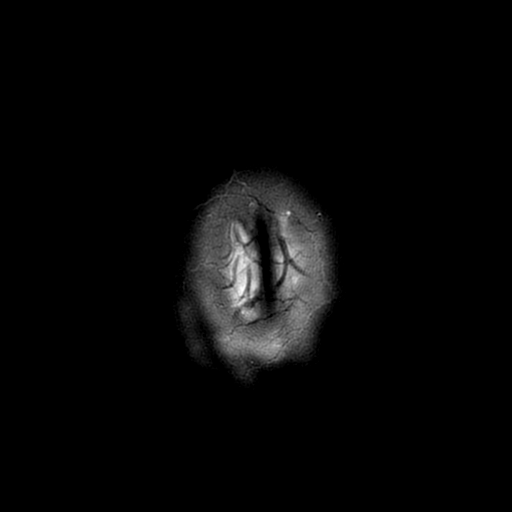

[Series 8: FLAIR · axial · 5.0mm · 0.94mm/px · z∈[-157,-13]mm · 3 of 25 slices shown (2 of 2)]
[im 1/25]
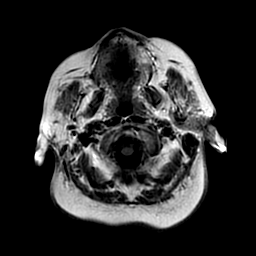
[im 13/25]
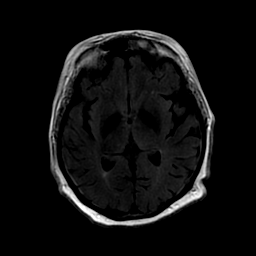
[im 25/25]
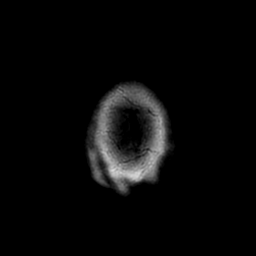

[Series 9: DWI · coronal · 4.0mm · 0.94mm/px · 8 of 68 slices shown (2 of 2)]
[im 1/68]
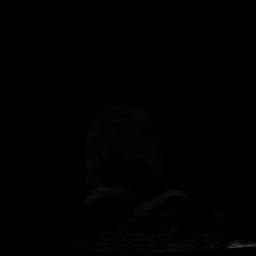
[im 10/68]
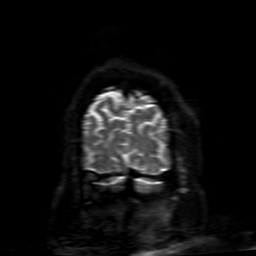
[im 20/68]
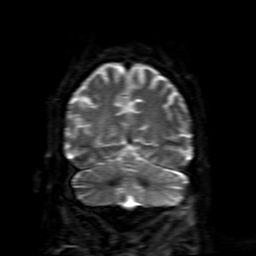
[im 29/68]
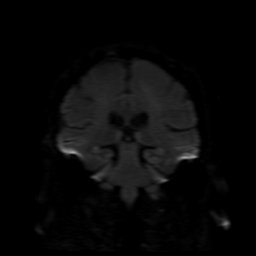
[im 39/68]
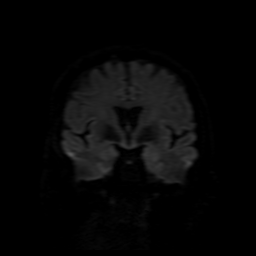
[im 48/68]
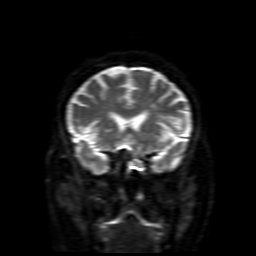
[im 58/68]
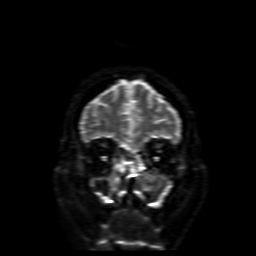
[im 68/68]
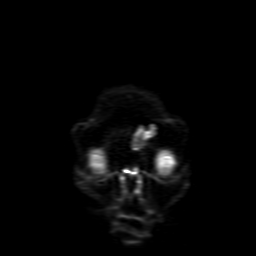

[Series 10: GRE · axial · 5.0mm · 0.47mm/px · 1 of 25 slices shown]
[im 1/25]
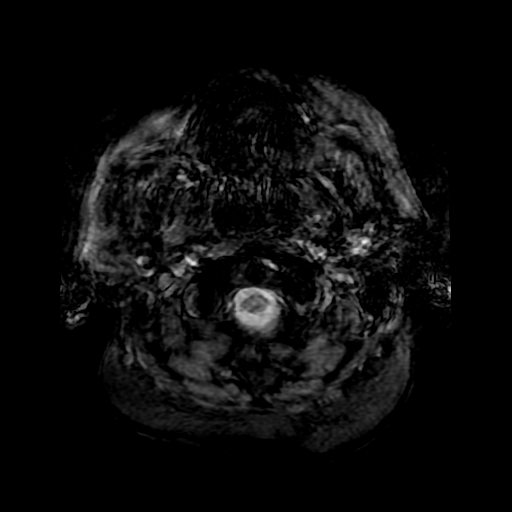

[Series 12: T2 · coronal · 5.0mm · 0.47mm/px · 3 of 28 slices shown (2 of 2)]
[im 1/28]
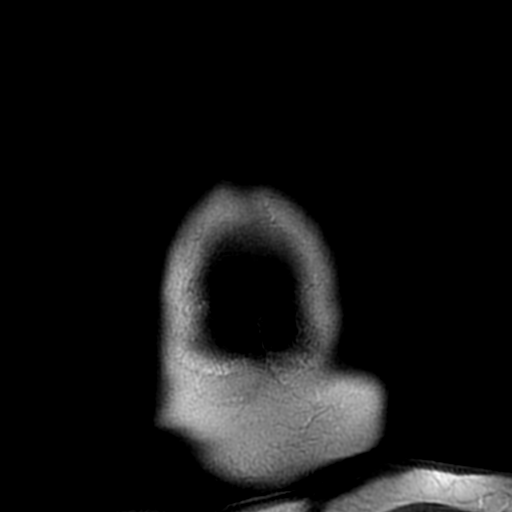
[im 14/28]
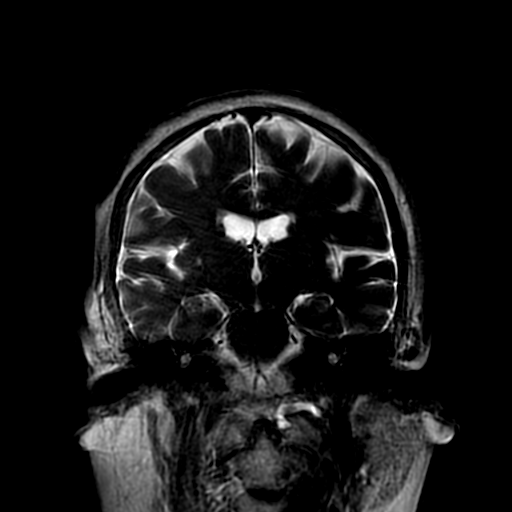
[im 28/28]
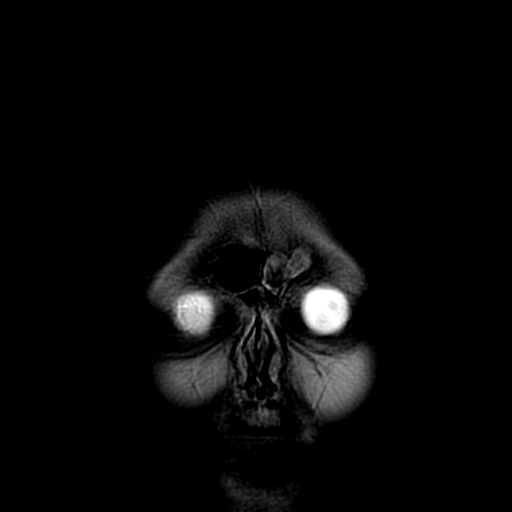

[Series 550: ADC · axial · 3.0mm · 0.94mm/px · z∈[-158,-12]mm · 6 of 48 slices shown (1 of 2)]
[im 1/48]
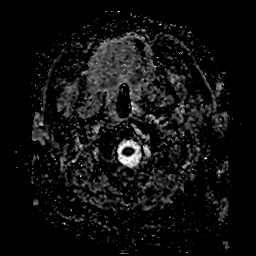
[im 10/48]
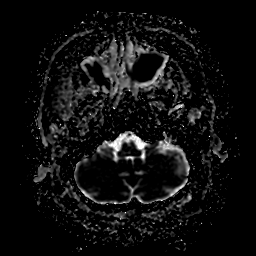
[im 19/48]
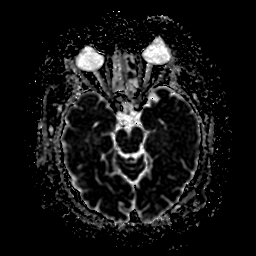
[im 29/48]
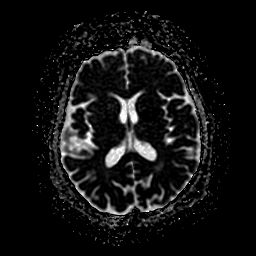
[im 38/48]
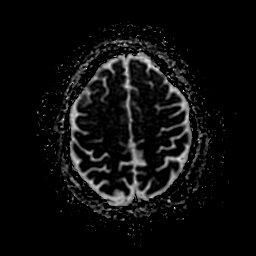
[im 48/48]
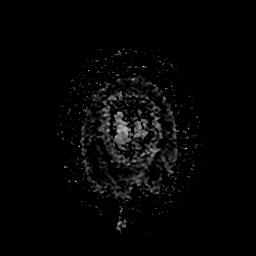

[Series 950: ADC · coronal · 4.0mm · 0.94mm/px · 4 of 34 slices shown (2 of 2)]
[im 1/34]
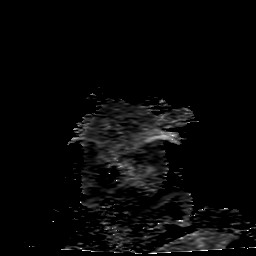
[im 12/34]
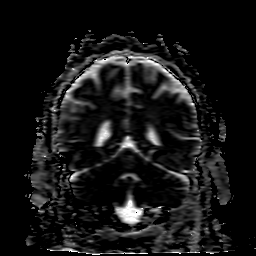
[im 23/34]
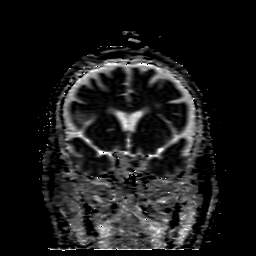
[im 34/34]
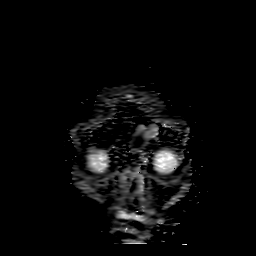

[40 of 48 positions shown; findings below may reference images not displayed]

FINDINGS: Calvarium and upper cervical spine: No focal marrow signal
abnormality.

Orbits: Bilateral cataract resection.

Sinuses and Mastoids: Advanced chronic sinusitis with complete
opacification/obstruction of the bilateral maxillary, left sphenoid,
left anterior and right posterior ethmoids, and left frontal
sinuses. Mucosal thickening seen on T2 weighted imaging with central
hypo intense inspissated secretions. There could be superimposed
fungal colonization. The medial left maxillary sinus wall is
bulging, obstructing the middle meatus and deviating the middle
turbinates. No evidence of invasive sinusitis in this patient on
immunosuppression.

Brain: No acute infarct, hemorrhage, hydrocephalus, extra-axial
collection or mass lesion. No evidence of large vessel occlusion.

Mild patchy white matter hyperintensities attributed to chronic
microvascular disease given patient's risk factors. No previous
cortical infarct. Normal cerebral volume.
IMPRESSION: 1. No acute finding.
2. Advanced chronic sinusitis with diffuse sinus obstruction as
described.
3. Mild chronic microvascular disease.

## 2018-01-27 IMAGING — CT CT ABD-PELV W/O CM
2 of 4 series · 16 of 46 positions shown, 18 images · non-contrast
Comparison: Renal ultrasound November 02, 2014

CLINICAL DATA: Diffuse abdominal pain, abdominal ecchymosis.
History of renal transplant, diabetes.

EXAM:
CT ABDOMEN AND PELVIS WITHOUT CONTRAST
TECHNIQUE: Multidetector CT imaging of the abdomen and pelvis was performed
following the standard protocol without IV contrast.

[Series 12: abd/ pelvis 5.0 i30f 1 · axial · 0.82mm/px · z∈[-1013,-498]mm · 13 of 113 slices shown, 15 images]
[im 5/113  soft-tissue]
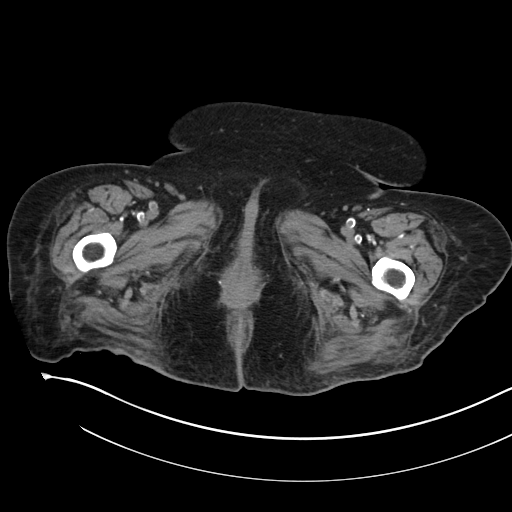
[im 5/113  bone]
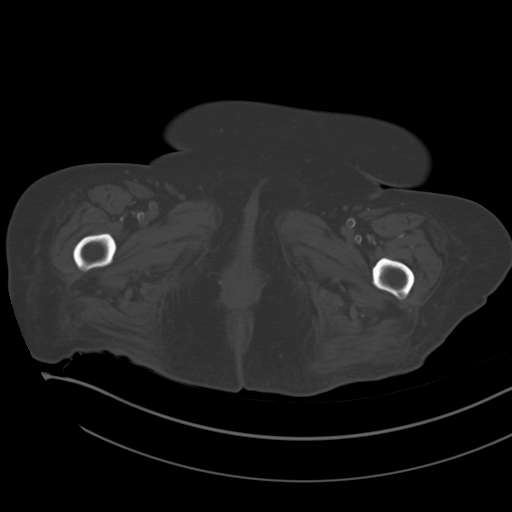
[im 14/113  soft-tissue]
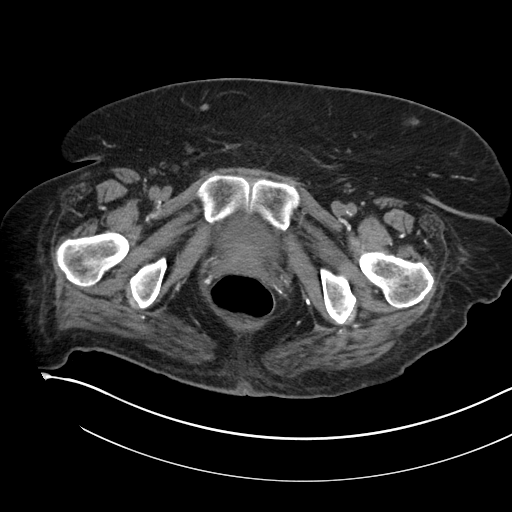
[im 23/113  soft-tissue]
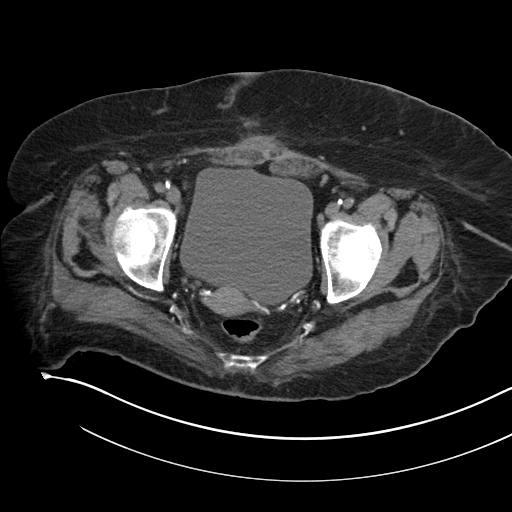
[im 32/113  soft-tissue]
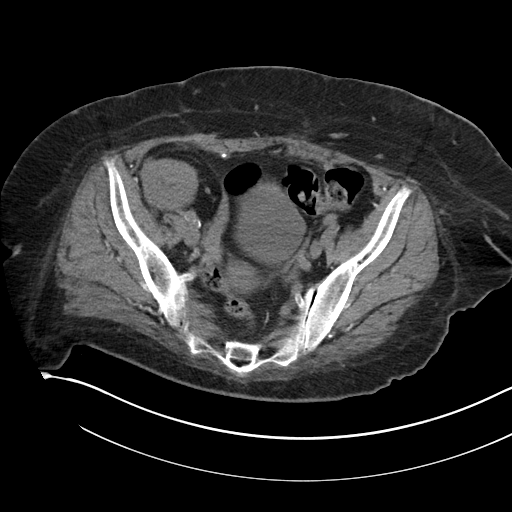
[im 41/113  soft-tissue]
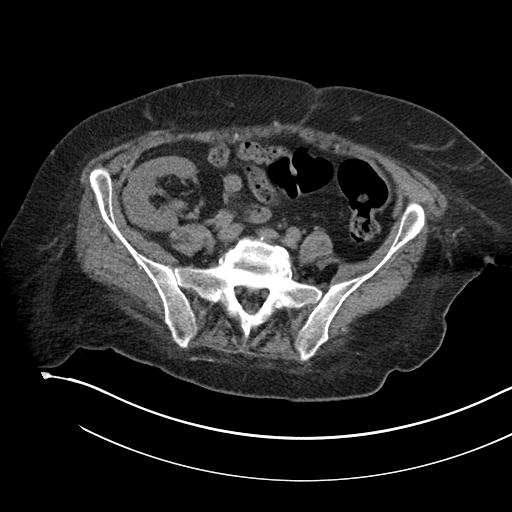
[im 50/113  soft-tissue]
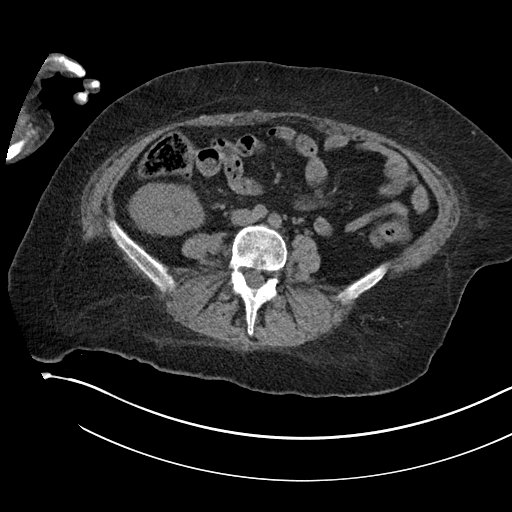
[im 59/113  soft-tissue]
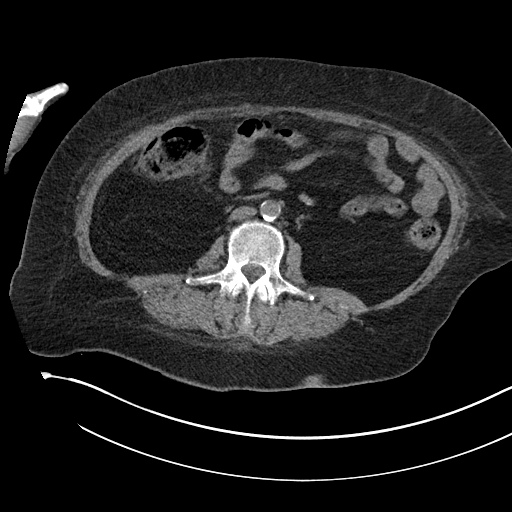
[im 63/113  soft-tissue]
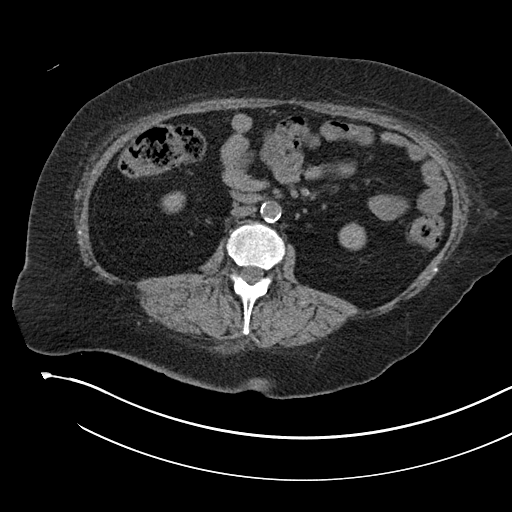
[im 72/113  soft-tissue]
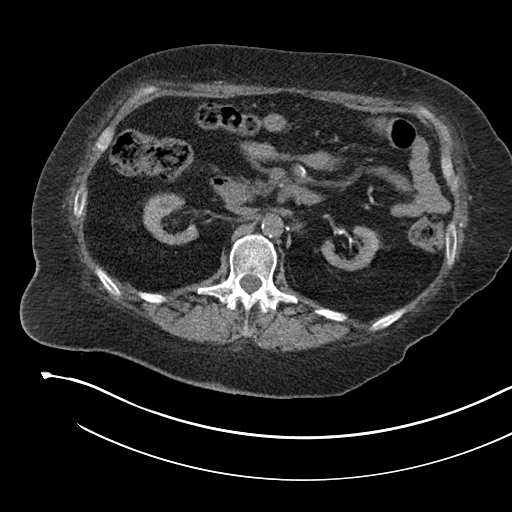
[im 72/113  bone]
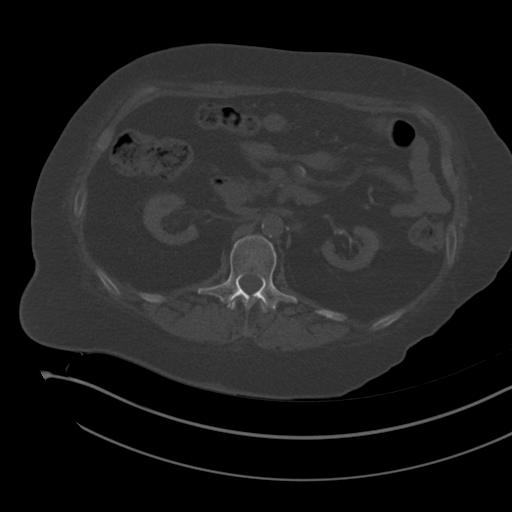
[im 81/113  soft-tissue]
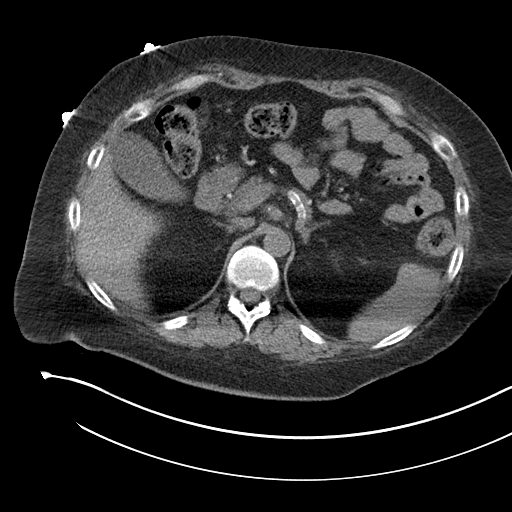
[im 90/113  soft-tissue]
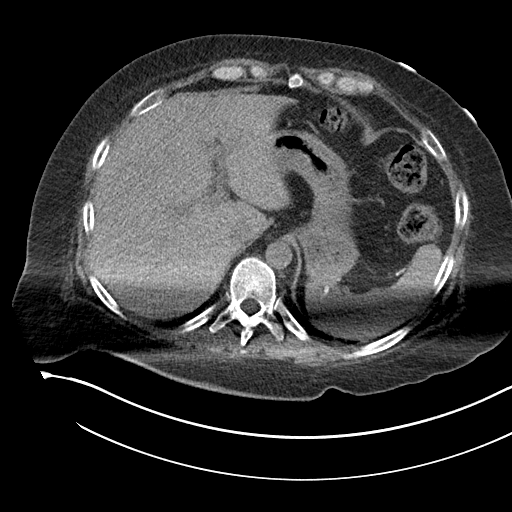
[im 99/113  soft-tissue]
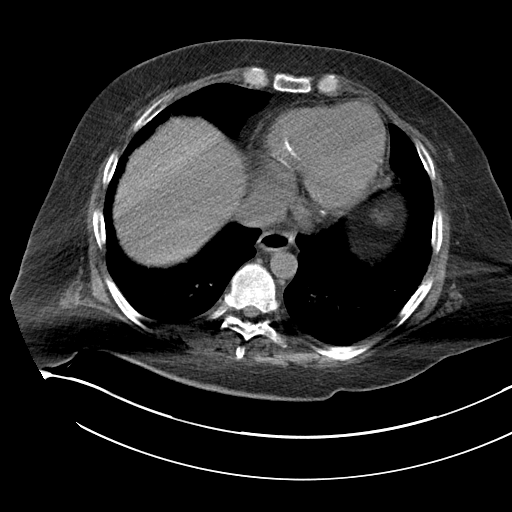
[im 108/113  soft-tissue]
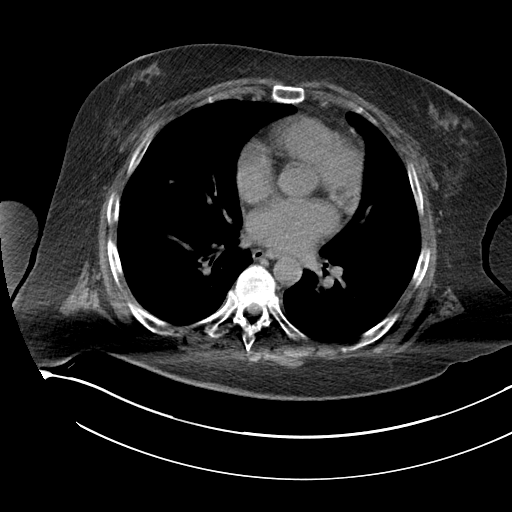

[Series 15: cor st · coronal · 0.80mm/px · 3 of 93 slices shown]
[im 31/93  soft-tissue]
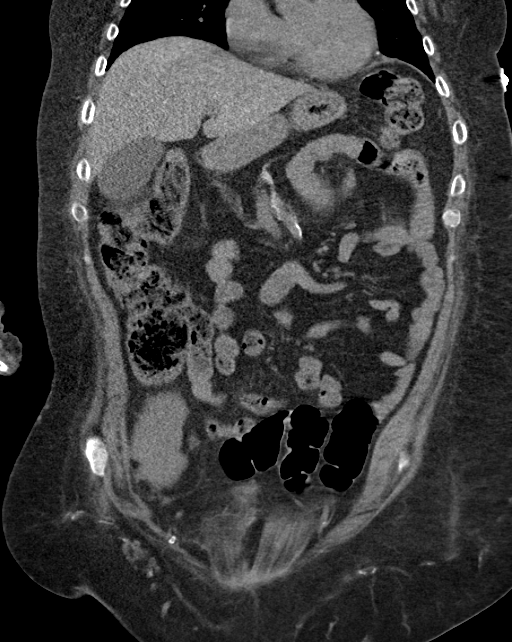
[im 41/93  soft-tissue]
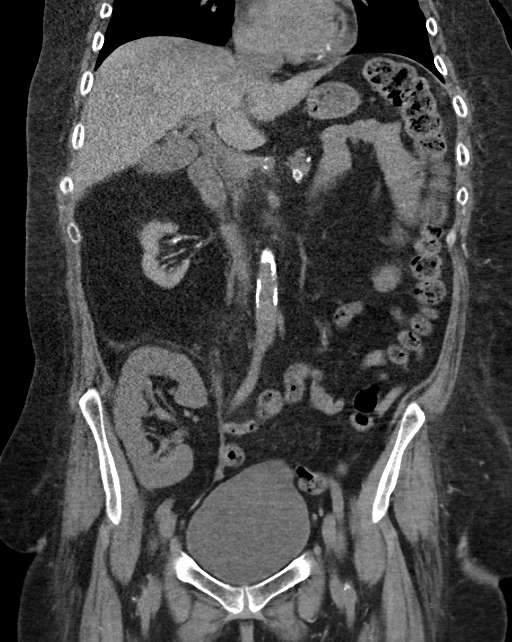
[im 52/93  soft-tissue]
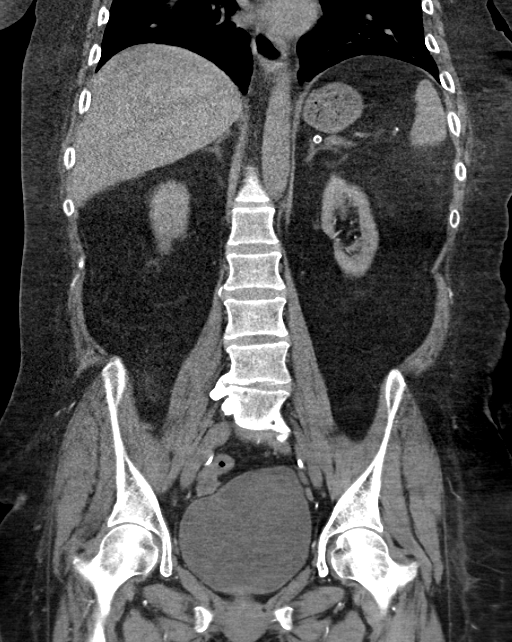

[16 of 46 positions shown; findings below may reference images not displayed]

FINDINGS: LUNG BASES: Lung bases are clear. Heart size is normal, mild
coronary artery and valvular calcifications. No pericardial
effusion.

KIDNEYS/BLADDER: Atrophic native kidneys without obstructive
uropathy. RIGHT pelvic kidney transplant without hydronephrosis or
urolithiasis. Limited assessment renal masses on noncontrast CT.
Urinary bladder is well distended and unremarkable.

SOLID ORGANS: The liver, spleen, gallbladder, pancreas and adrenal
glands are unremarkable for this non-contrast examination.

GASTROINTESTINAL TRACT: Very small hiatal hernia. The stomach, small
and large bowel are normal in course and caliber without
inflammatory changes, the sensitivity may be decreased by lack of
enteric contrast. Moderate amount of retained large bowel stool.
Normal appendix.

PERITONEUM/RETROPERITONEUM: Aortoiliac vessels are normal in course
and caliber, moderate calcific atherosclerosis. Severe small vessel
atherosclerosis consistent with end-stage renal disease. No
lymphadenopathy by CT size criteria. Internal reproductive organs
are unremarkable. No intraperitoneal free fluid nor free air.

SOFT TISSUES/ OSSEOUS STRUCTURES: Nonsuspicious. Anterior abdominal
wall scarring, no focal fluid collection. Normal appearance of the
abdominal wall. Moderate L4-5 and L5-S1 degenerative discs, moderate
to severe L5-S1 neural foraminal narrowing.
IMPRESSION: No acute intra-abdominal or pelvic process.

Atrophic native kidneys. RIGHT pelvic renal transplant without
urolithiasis or obstructive uropathy.

## 2018-01-28 IMAGING — CR DG CHEST 1V PORT
1 series · 1 of 1 positions shown · non-contrast
Comparison: 04/10/2016

CLINICAL DATA: Sepsis

EXAM:
PORTABLE CHEST 1 VIEW

[AP]
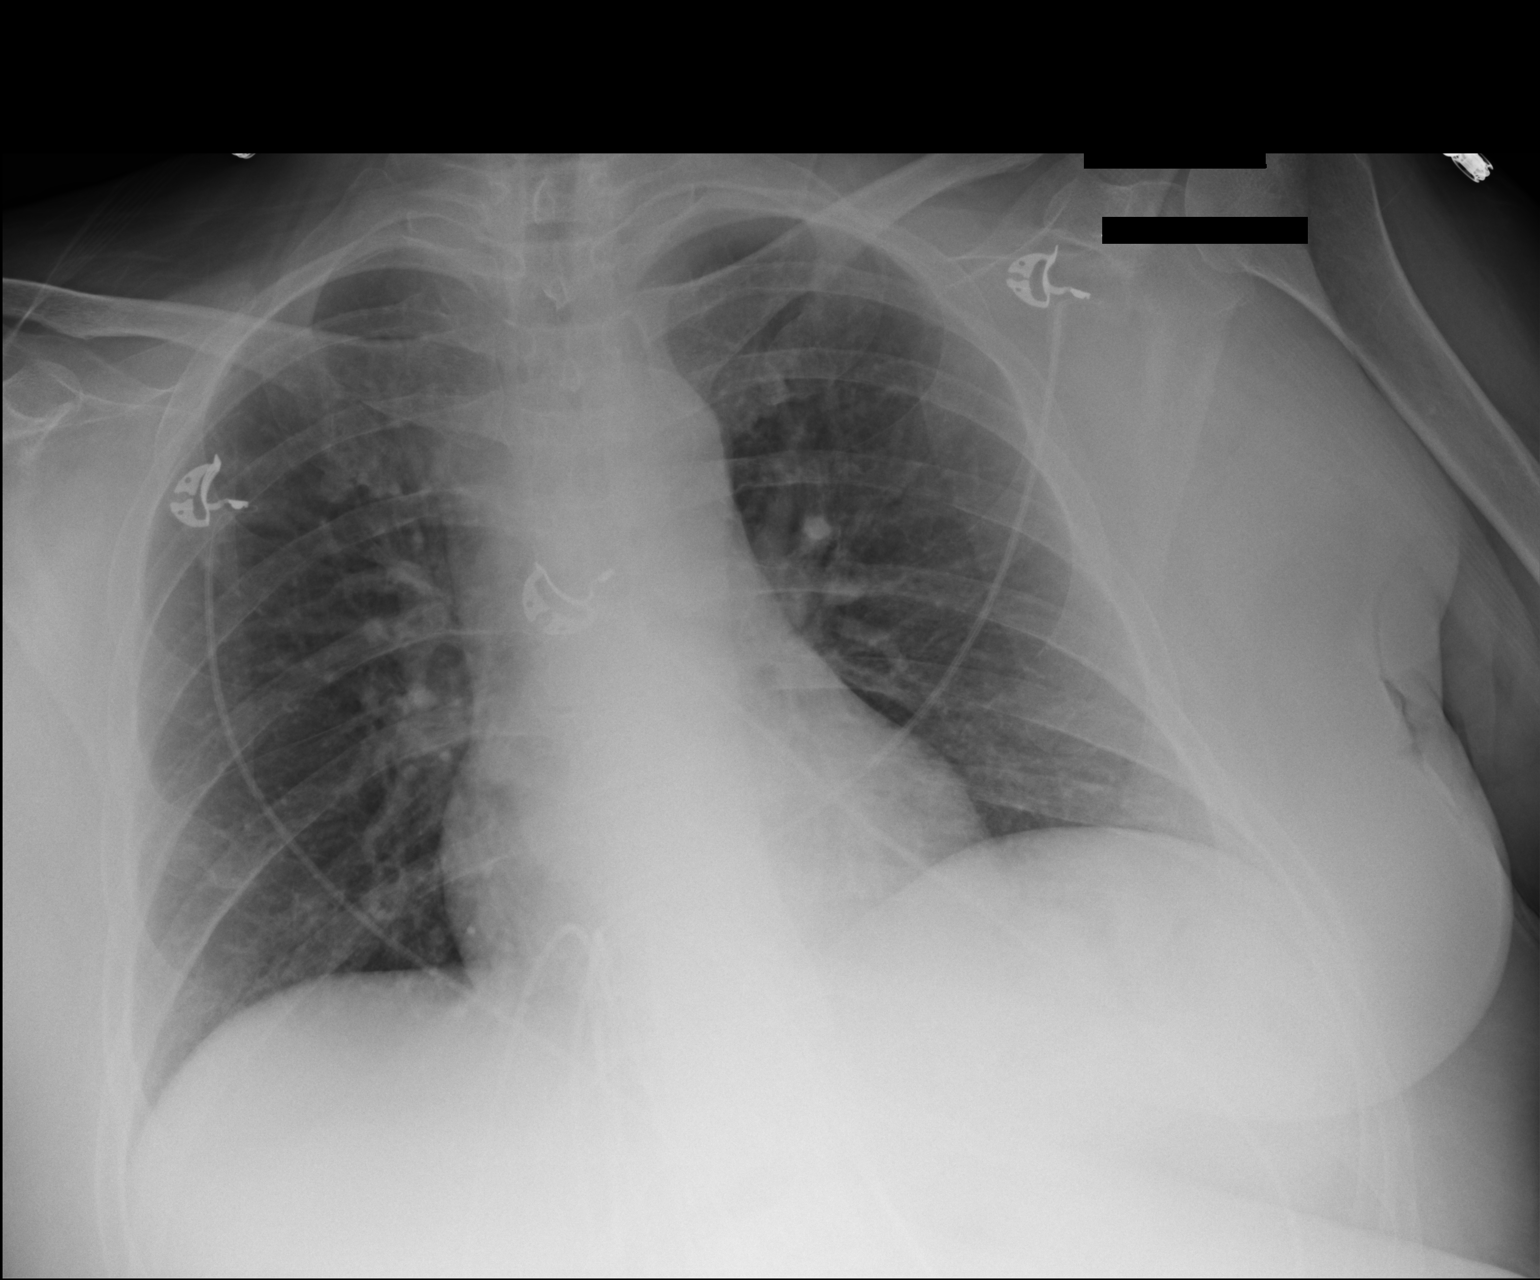

[1 of 1 positions shown; findings below may reference images not displayed]

FINDINGS: Normal mediastinum and cardiac silhouette. Normal pulmonary
vasculature. No evidence of effusion, infiltrate, or pneumothorax.
No acute bony abnormality.
IMPRESSION: No acute cardiopulmonary process.

## 2018-01-28 IMAGING — CR DG ABD PORTABLE 1V
1 series · 1 of 1 positions shown · non-contrast
Comparison: Abdominal radiograph 10/23/2014.

CLINICAL DATA: 50-year-old female with history of feeding tube
placement.

EXAM:
PORTABLE ABDOMEN - 1 VIEW

[AP]
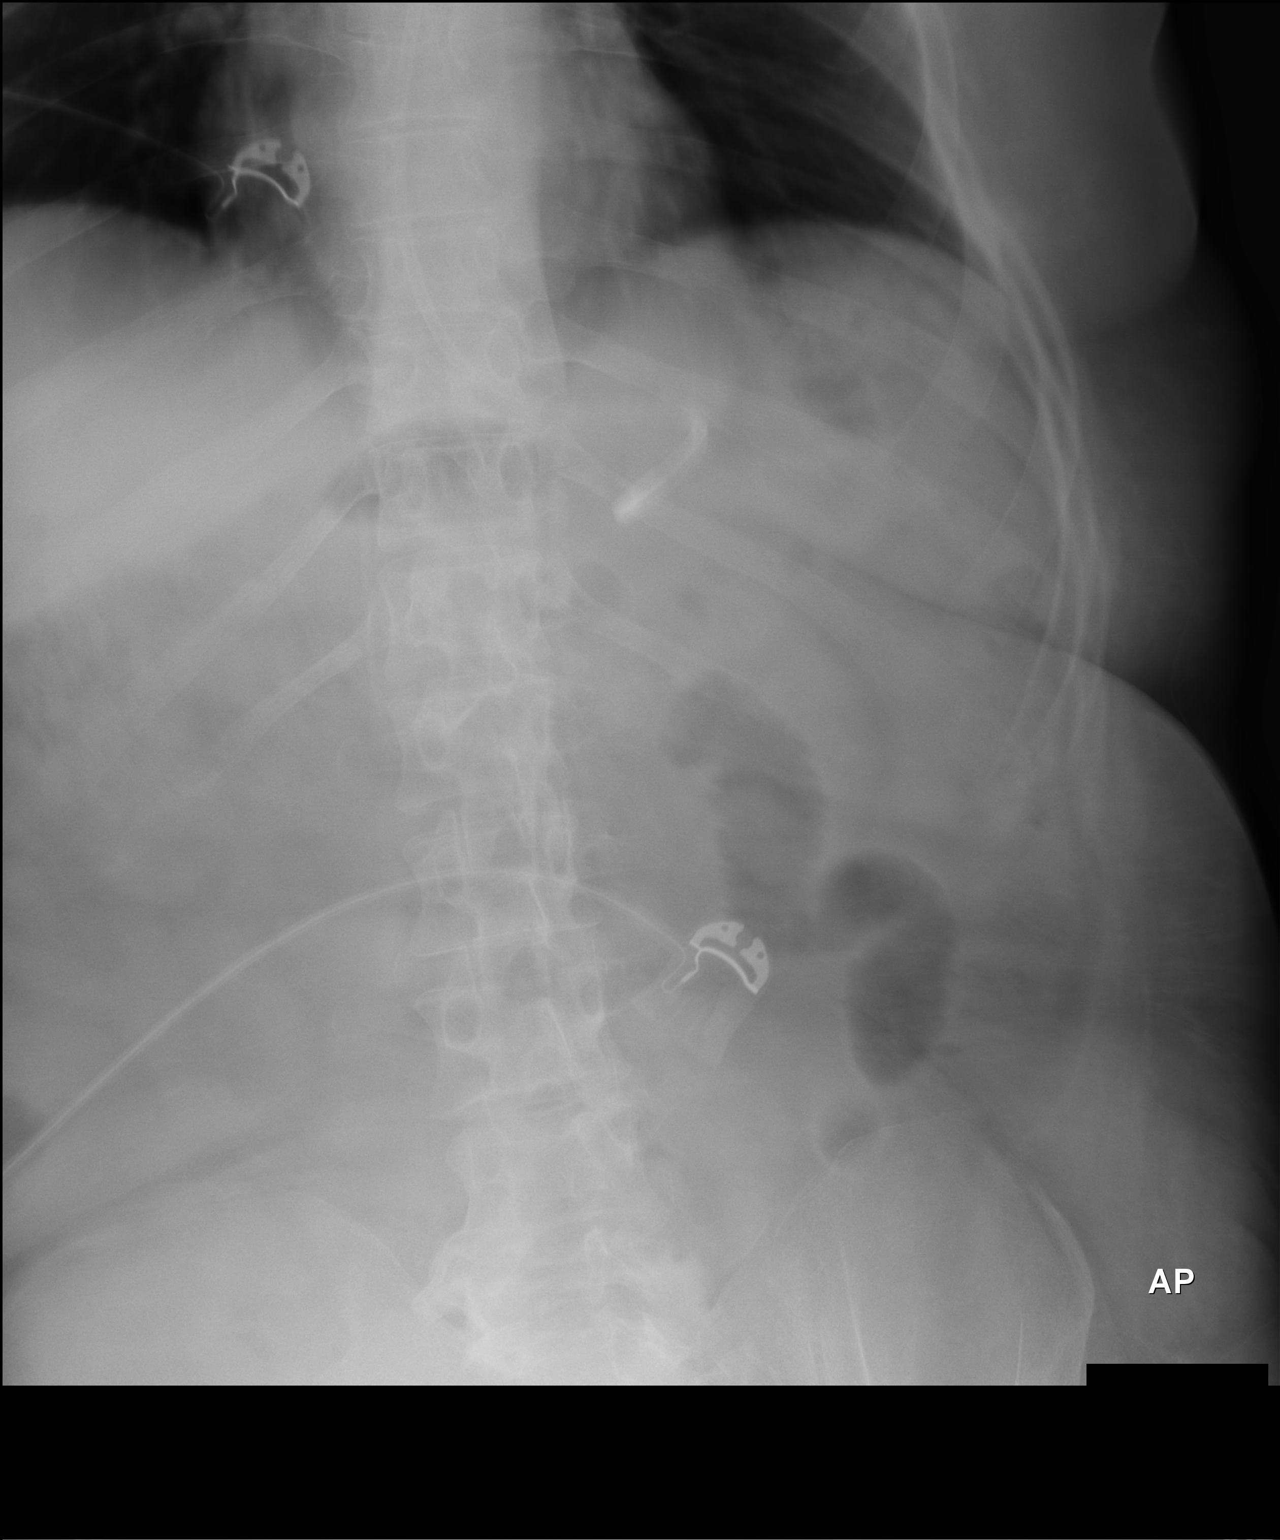

[1 of 1 positions shown; findings below may reference images not displayed]

FINDINGS: Tip of feeding tube is in the proximal body of the stomach.
Nonspecific bowel-gas pattern with nondilated gas-filled loops of
small bowel in the left side of the abdomen, and otherwise relative
paucity of bowel gas.
IMPRESSION: 1. Tip of feeding tube is in the proximal body of the stomach.

## 2018-01-29 IMAGING — CR DG CHEST 1V PORT
1 series · 1 of 1 positions shown · non-contrast
Comparison: April 11, 2016

CLINICAL DATA: Cough.

EXAM:
PORTABLE CHEST 1 VIEW

[AP]
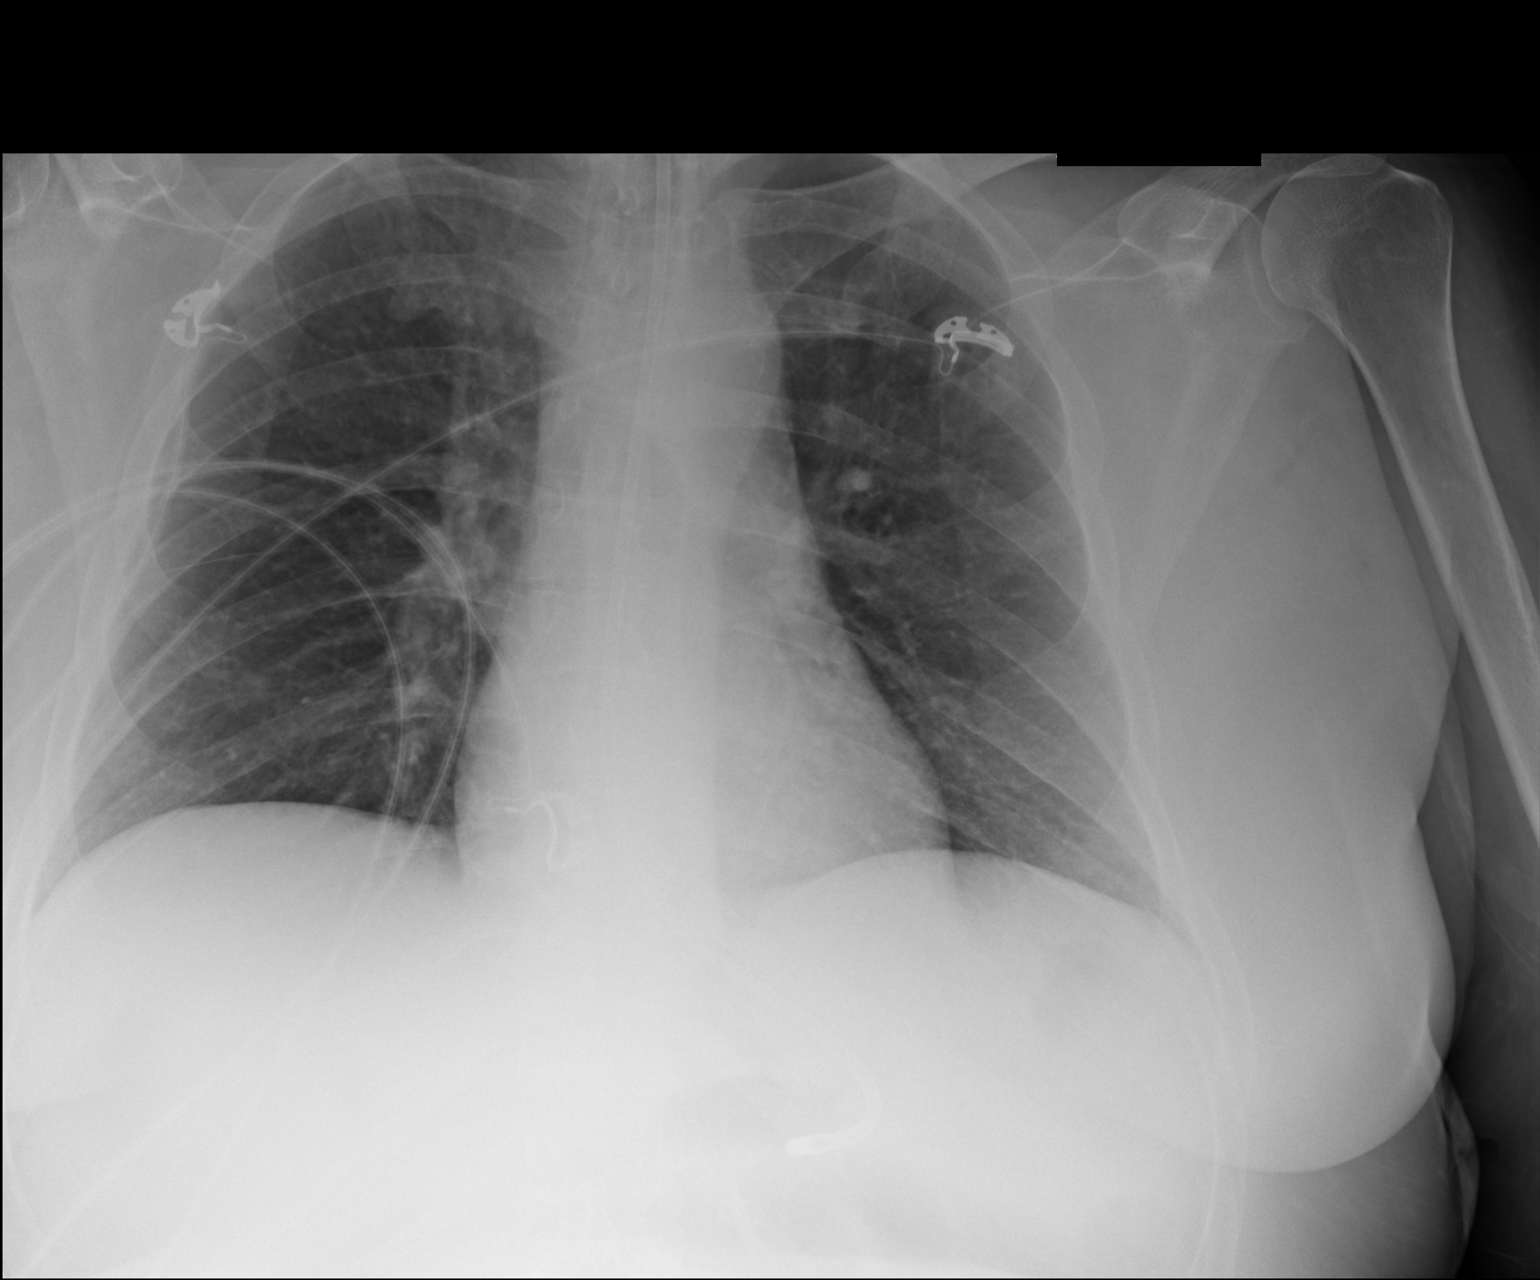

[1 of 1 positions shown; findings below may reference images not displayed]

FINDINGS: A feeding tube terminates in the stomach. The heart, hila,
mediastinum, lungs, and pleura are normal.
IMPRESSION: The feeding tube terminates in the stomach.  No other abnormalities.

## 2018-02-01 IMAGING — DX DG CHEST 1V
1 series · 1 of 1 positions shown · non-contrast
Comparison: 04/12/2016

CLINICAL DATA: Cough

EXAM:
CHEST 1 VIEW

[chest ap]
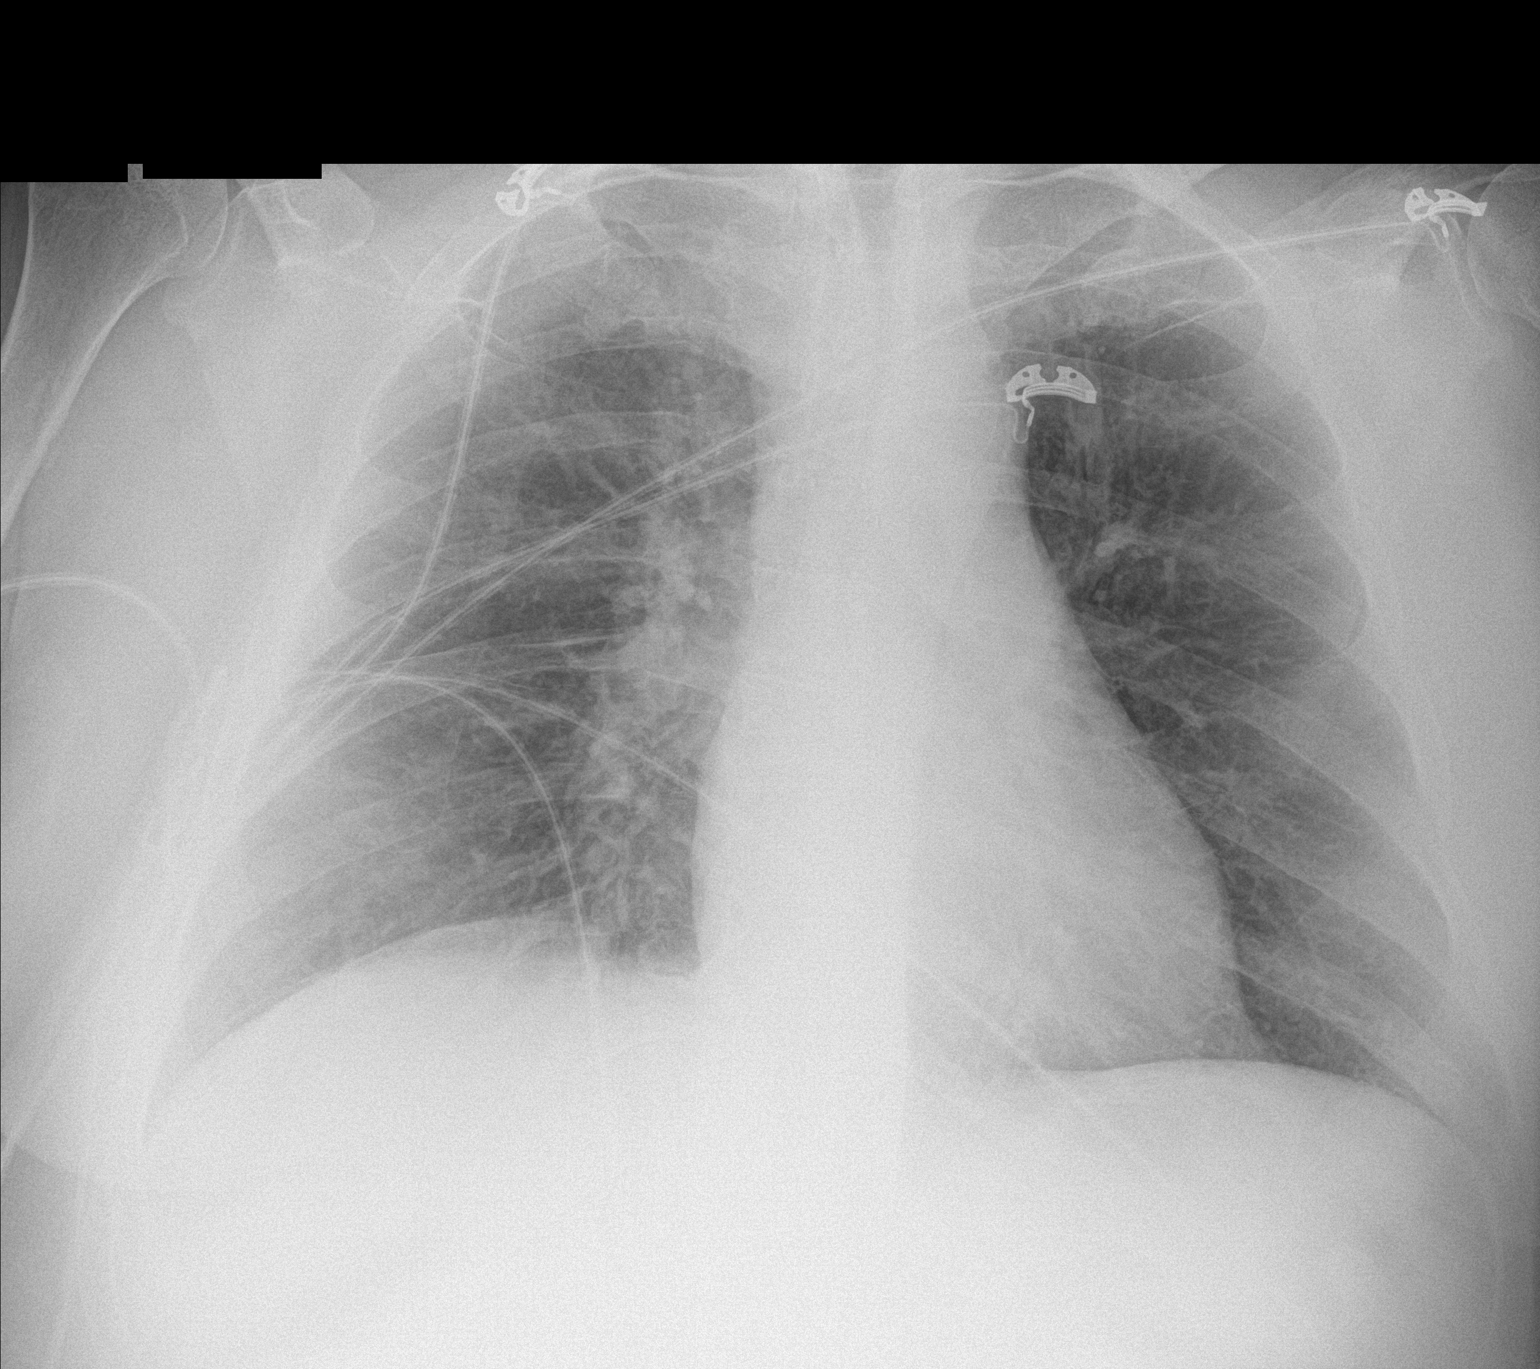

[1 of 1 positions shown; findings below may reference images not displayed]

FINDINGS: Cardiomediastinal silhouette is stable. No acute infiltrate or
pleural effusion. No pulmonary edema. Mild perihilar bronchitic
changes.
IMPRESSION: No infiltrate or pulmonary edema. Mild perihilar bronchitic changes.

## 2018-02-14 ENCOUNTER — Encounter: Payer: Self-pay | Admitting: Internal Medicine

## 2018-02-14 DIAGNOSIS — E11628 Type 2 diabetes mellitus with other skin complications: Secondary | ICD-10-CM | POA: Diagnosis not present

## 2018-02-14 DIAGNOSIS — Z940 Kidney transplant status: Secondary | ICD-10-CM | POA: Diagnosis not present

## 2018-02-14 DIAGNOSIS — N183 Chronic kidney disease, stage 3 (moderate): Secondary | ICD-10-CM | POA: Diagnosis not present

## 2018-02-15 DIAGNOSIS — E11628 Type 2 diabetes mellitus with other skin complications: Secondary | ICD-10-CM | POA: Diagnosis not present

## 2018-02-15 DIAGNOSIS — M79604 Pain in right leg: Secondary | ICD-10-CM | POA: Diagnosis not present

## 2018-02-15 DIAGNOSIS — Z940 Kidney transplant status: Secondary | ICD-10-CM | POA: Diagnosis not present

## 2018-02-15 DIAGNOSIS — N183 Chronic kidney disease, stage 3 (moderate): Secondary | ICD-10-CM | POA: Diagnosis not present

## 2018-02-16 DIAGNOSIS — N183 Chronic kidney disease, stage 3 (moderate): Secondary | ICD-10-CM | POA: Diagnosis not present

## 2018-02-16 DIAGNOSIS — E11628 Type 2 diabetes mellitus with other skin complications: Secondary | ICD-10-CM | POA: Diagnosis not present

## 2018-02-16 DIAGNOSIS — Z940 Kidney transplant status: Secondary | ICD-10-CM | POA: Diagnosis not present

## 2018-02-17 DIAGNOSIS — N183 Chronic kidney disease, stage 3 (moderate): Secondary | ICD-10-CM | POA: Diagnosis not present

## 2018-02-17 DIAGNOSIS — Z940 Kidney transplant status: Secondary | ICD-10-CM | POA: Diagnosis not present

## 2018-02-17 DIAGNOSIS — E11628 Type 2 diabetes mellitus with other skin complications: Secondary | ICD-10-CM | POA: Diagnosis not present

## 2018-02-18 DIAGNOSIS — N183 Chronic kidney disease, stage 3 (moderate): Secondary | ICD-10-CM | POA: Diagnosis not present

## 2018-02-18 DIAGNOSIS — Z940 Kidney transplant status: Secondary | ICD-10-CM | POA: Diagnosis not present

## 2018-02-18 DIAGNOSIS — E11628 Type 2 diabetes mellitus with other skin complications: Secondary | ICD-10-CM | POA: Diagnosis not present

## 2018-02-19 DIAGNOSIS — N183 Chronic kidney disease, stage 3 (moderate): Secondary | ICD-10-CM | POA: Diagnosis not present

## 2018-02-19 DIAGNOSIS — E11628 Type 2 diabetes mellitus with other skin complications: Secondary | ICD-10-CM | POA: Diagnosis not present

## 2018-02-19 DIAGNOSIS — Z940 Kidney transplant status: Secondary | ICD-10-CM | POA: Diagnosis not present

## 2018-02-20 DIAGNOSIS — N183 Chronic kidney disease, stage 3 (moderate): Secondary | ICD-10-CM | POA: Diagnosis not present

## 2018-02-20 DIAGNOSIS — E11628 Type 2 diabetes mellitus with other skin complications: Secondary | ICD-10-CM | POA: Diagnosis not present

## 2018-02-20 DIAGNOSIS — Z940 Kidney transplant status: Secondary | ICD-10-CM | POA: Diagnosis not present

## 2018-02-21 DIAGNOSIS — E11628 Type 2 diabetes mellitus with other skin complications: Secondary | ICD-10-CM | POA: Diagnosis not present

## 2018-02-21 DIAGNOSIS — Z940 Kidney transplant status: Secondary | ICD-10-CM | POA: Diagnosis not present

## 2018-02-21 DIAGNOSIS — N183 Chronic kidney disease, stage 3 (moderate): Secondary | ICD-10-CM | POA: Diagnosis not present

## 2018-02-22 DIAGNOSIS — S81801A Unspecified open wound, right lower leg, initial encounter: Secondary | ICD-10-CM | POA: Diagnosis not present

## 2018-02-22 DIAGNOSIS — E875 Hyperkalemia: Secondary | ICD-10-CM

## 2018-02-22 DIAGNOSIS — R739 Hyperglycemia, unspecified: Secondary | ICD-10-CM

## 2018-02-22 DIAGNOSIS — N179 Acute kidney failure, unspecified: Secondary | ICD-10-CM | POA: Diagnosis not present

## 2018-02-22 DIAGNOSIS — E1110 Type 2 diabetes mellitus with ketoacidosis without coma: Secondary | ICD-10-CM

## 2018-02-22 DIAGNOSIS — N183 Chronic kidney disease, stage 3 (moderate): Secondary | ICD-10-CM | POA: Diagnosis not present

## 2018-02-22 DIAGNOSIS — Z940 Kidney transplant status: Secondary | ICD-10-CM | POA: Diagnosis not present

## 2018-02-23 DIAGNOSIS — N179 Acute kidney failure, unspecified: Secondary | ICD-10-CM | POA: Diagnosis not present

## 2018-02-23 DIAGNOSIS — E1110 Type 2 diabetes mellitus with ketoacidosis without coma: Secondary | ICD-10-CM | POA: Diagnosis not present

## 2018-02-23 DIAGNOSIS — S81801A Unspecified open wound, right lower leg, initial encounter: Secondary | ICD-10-CM | POA: Diagnosis not present

## 2018-02-23 DIAGNOSIS — Z940 Kidney transplant status: Secondary | ICD-10-CM | POA: Diagnosis not present

## 2018-02-24 DIAGNOSIS — E872 Acidosis: Secondary | ICD-10-CM | POA: Diagnosis not present

## 2018-02-24 DIAGNOSIS — E1110 Type 2 diabetes mellitus with ketoacidosis without coma: Secondary | ICD-10-CM | POA: Diagnosis not present

## 2018-02-24 DIAGNOSIS — N179 Acute kidney failure, unspecified: Secondary | ICD-10-CM | POA: Diagnosis not present

## 2018-02-24 DIAGNOSIS — S81801A Unspecified open wound, right lower leg, initial encounter: Secondary | ICD-10-CM | POA: Diagnosis not present

## 2018-02-24 DIAGNOSIS — Z940 Kidney transplant status: Secondary | ICD-10-CM | POA: Diagnosis not present

## 2018-02-25 DIAGNOSIS — S81801A Unspecified open wound, right lower leg, initial encounter: Secondary | ICD-10-CM | POA: Diagnosis not present

## 2018-02-25 DIAGNOSIS — Z940 Kidney transplant status: Secondary | ICD-10-CM | POA: Diagnosis not present

## 2018-02-25 DIAGNOSIS — E1110 Type 2 diabetes mellitus with ketoacidosis without coma: Secondary | ICD-10-CM | POA: Diagnosis not present

## 2018-02-25 DIAGNOSIS — E872 Acidosis: Secondary | ICD-10-CM | POA: Diagnosis not present

## 2018-02-25 DIAGNOSIS — N179 Acute kidney failure, unspecified: Secondary | ICD-10-CM | POA: Diagnosis not present

## 2018-02-26 DIAGNOSIS — E1110 Type 2 diabetes mellitus with ketoacidosis without coma: Secondary | ICD-10-CM | POA: Diagnosis not present

## 2018-02-26 DIAGNOSIS — S81801A Unspecified open wound, right lower leg, initial encounter: Secondary | ICD-10-CM | POA: Diagnosis not present

## 2018-02-26 DIAGNOSIS — Z940 Kidney transplant status: Secondary | ICD-10-CM | POA: Diagnosis not present

## 2018-02-26 DIAGNOSIS — N179 Acute kidney failure, unspecified: Secondary | ICD-10-CM | POA: Diagnosis not present

## 2018-02-26 DIAGNOSIS — E872 Acidosis: Secondary | ICD-10-CM | POA: Diagnosis not present

## 2018-02-26 IMAGING — DX DG KNEE COMPLETE 4+V*L*
4 series · 4 of 4 positions shown · non-contrast
Comparison: None.

CLINICAL DATA: Pt fell on tile kitchen floor at home, landed both
knees first. On her left knee she feels pain medially and
anteriorly, and her right knee has pain anteriorly and just above
the patella.

EXAM:
LEFT KNEE - COMPLETE 4+ VIEW

[knee ap]
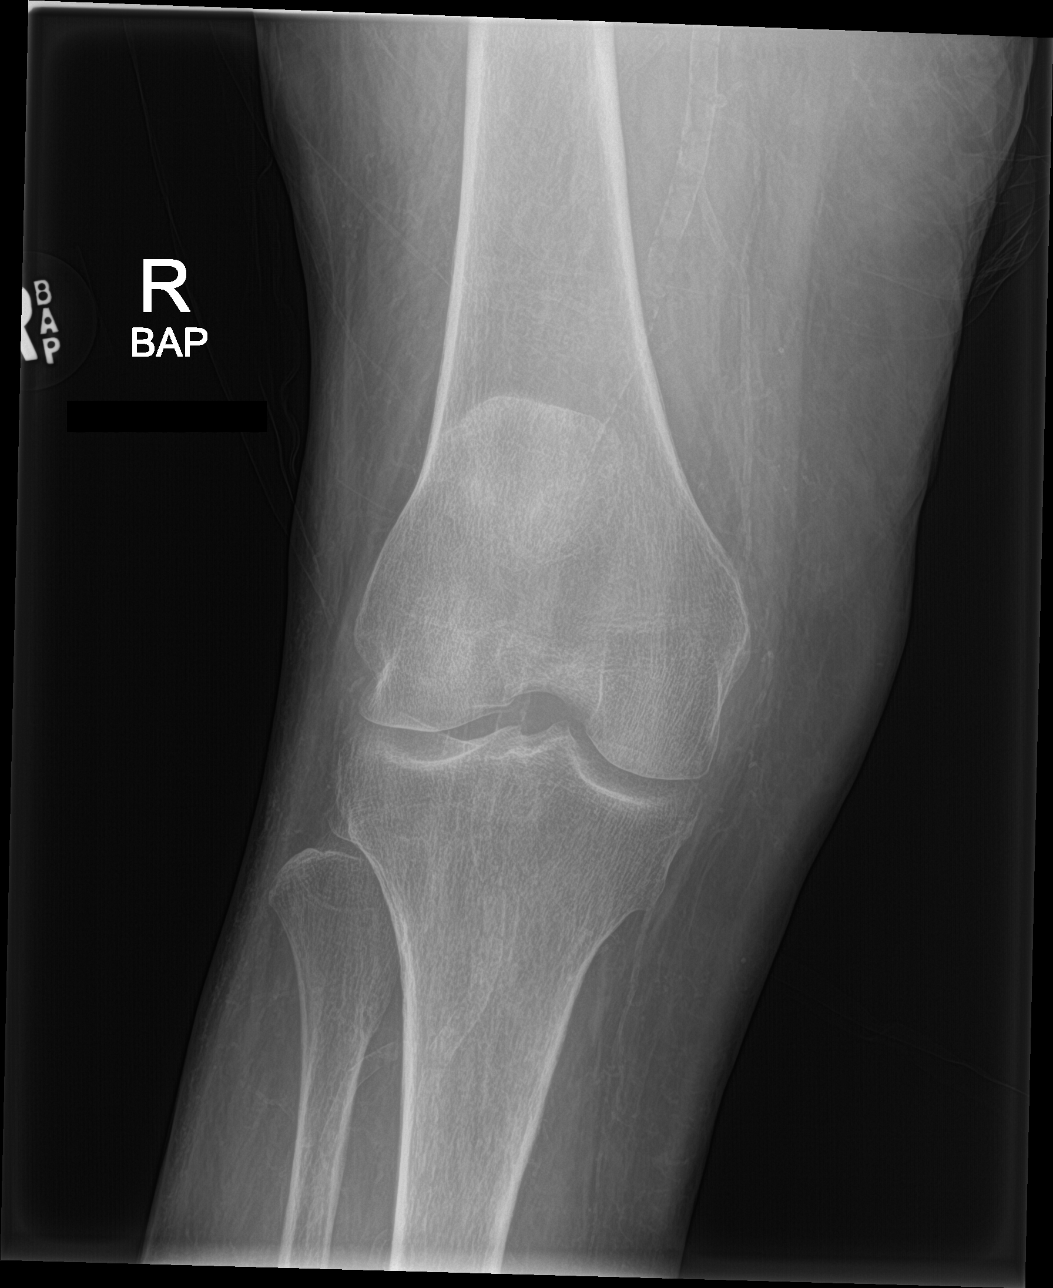

[tunnel]
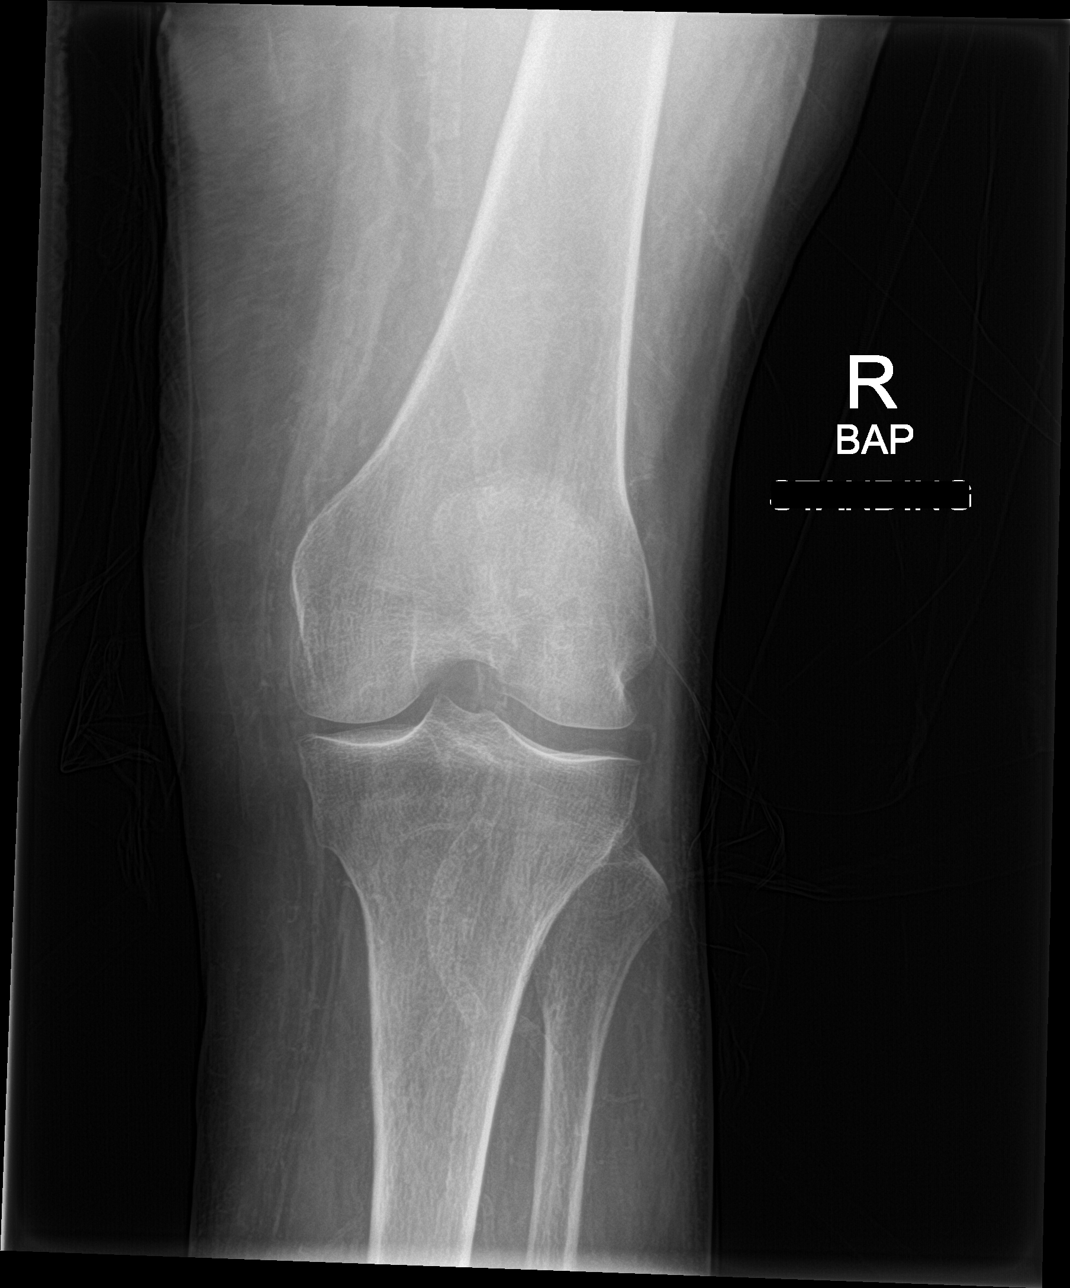

[knee lat]
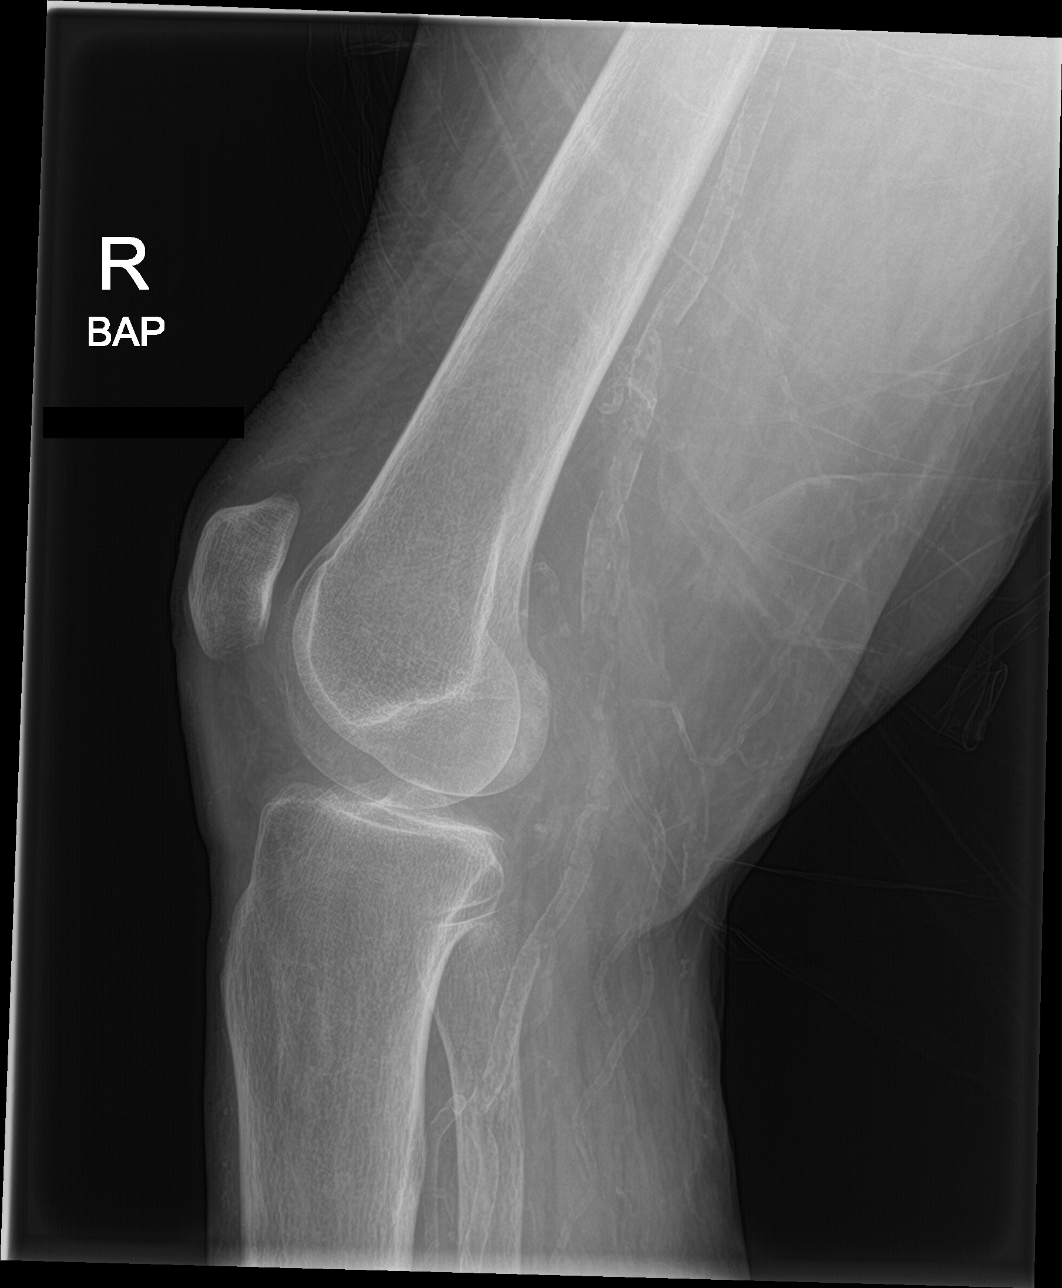

[knee sunrise]
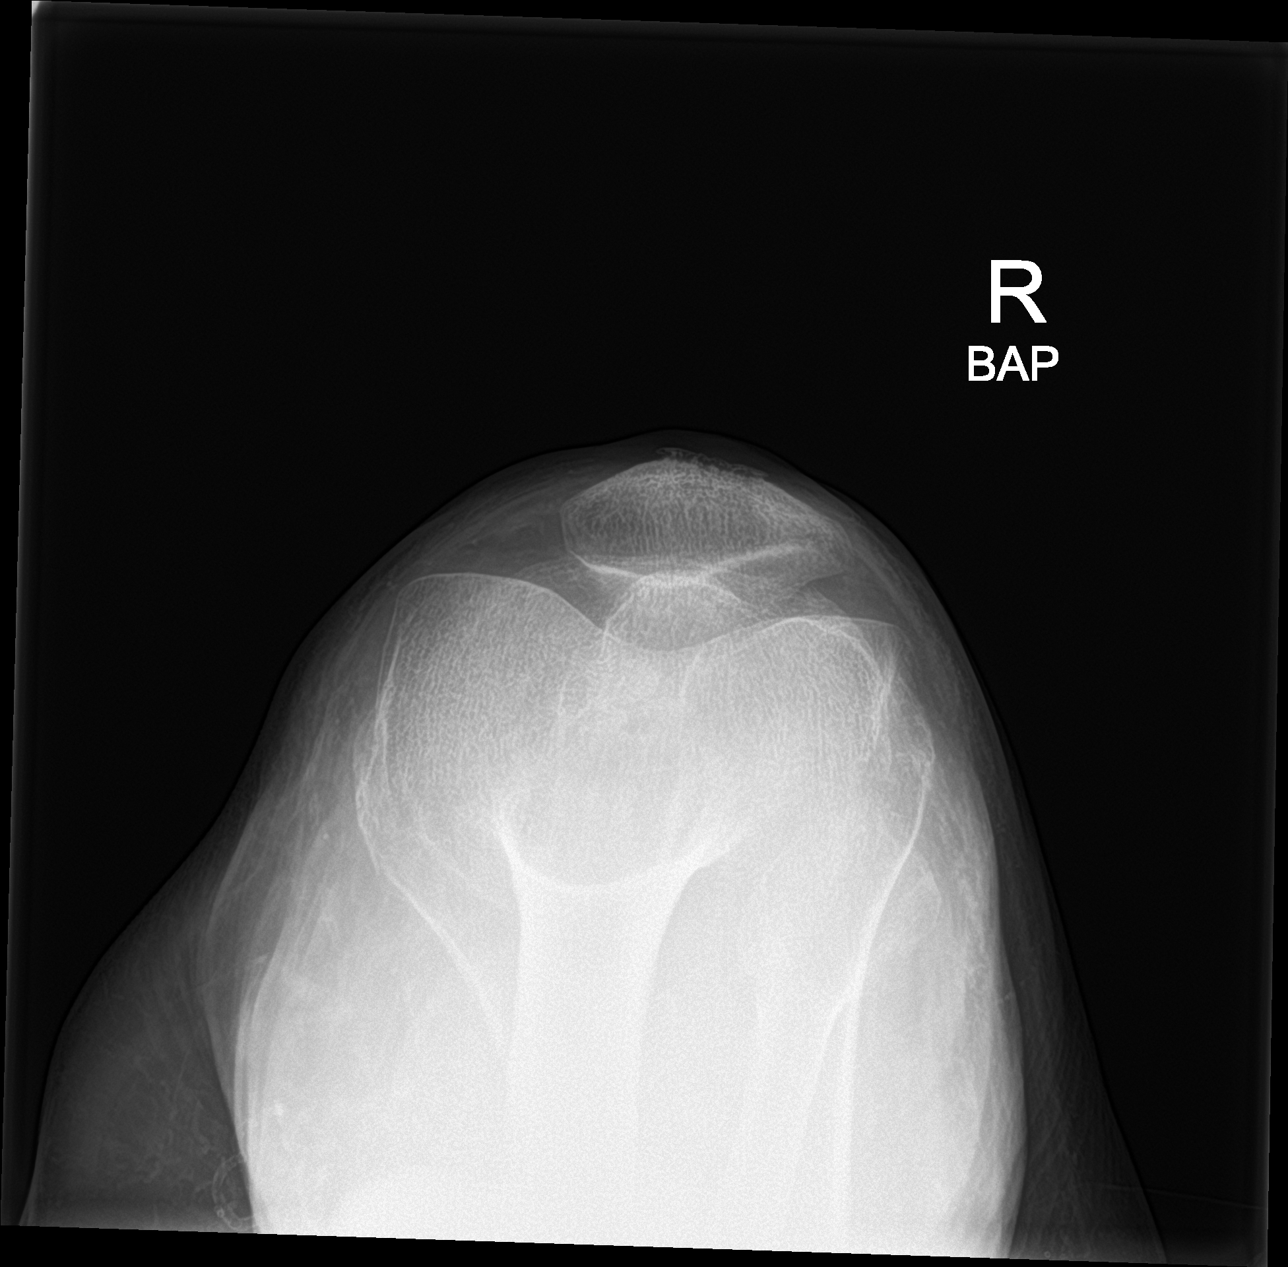

[4 of 4 positions shown; findings below may reference images not displayed]

FINDINGS: No evidence of fracture, dislocation, or joint effusion. No evidence
of arthropathy or other focal bone abnormality. Significant arterial
calcifications noted.
IMPRESSION: No evidence for acute  abnormality.

## 2018-02-26 IMAGING — DX DG KNEE COMPLETE 4+V*R*
4 series · 4 of 4 positions shown · non-contrast
Comparison: None.

CLINICAL DATA: Pt fell on tile kitchen floor at home, landed both
knees first. On her left knee she feels pain medially and
anteriorly, and her right knee has pain anteriorly and just above
the patella.

EXAM:
RIGHT KNEE - COMPLETE 4+ VIEW

[knee ap]
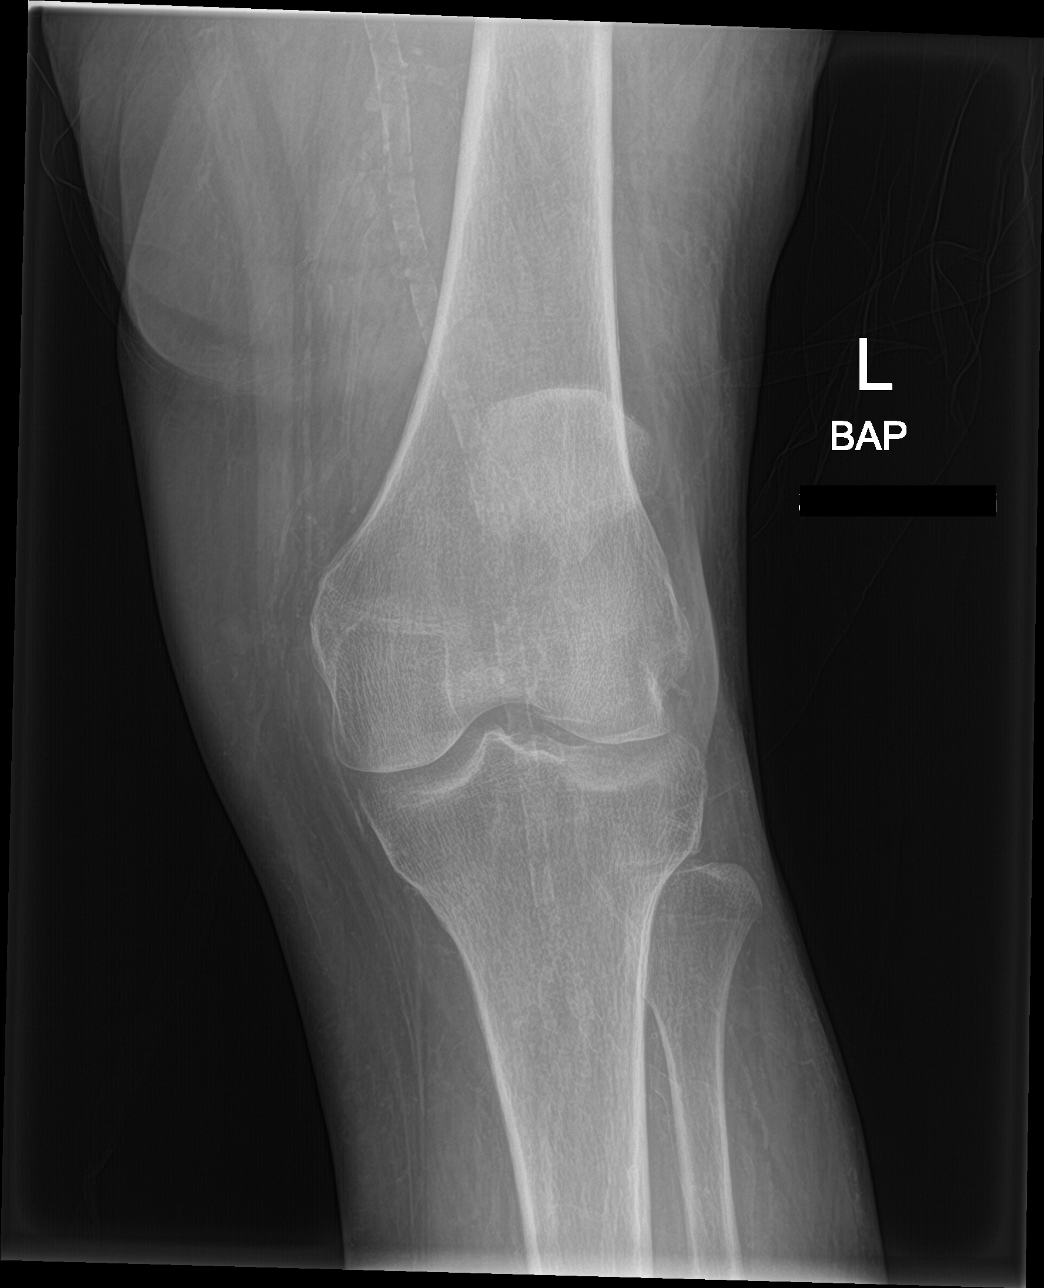

[tunnel]
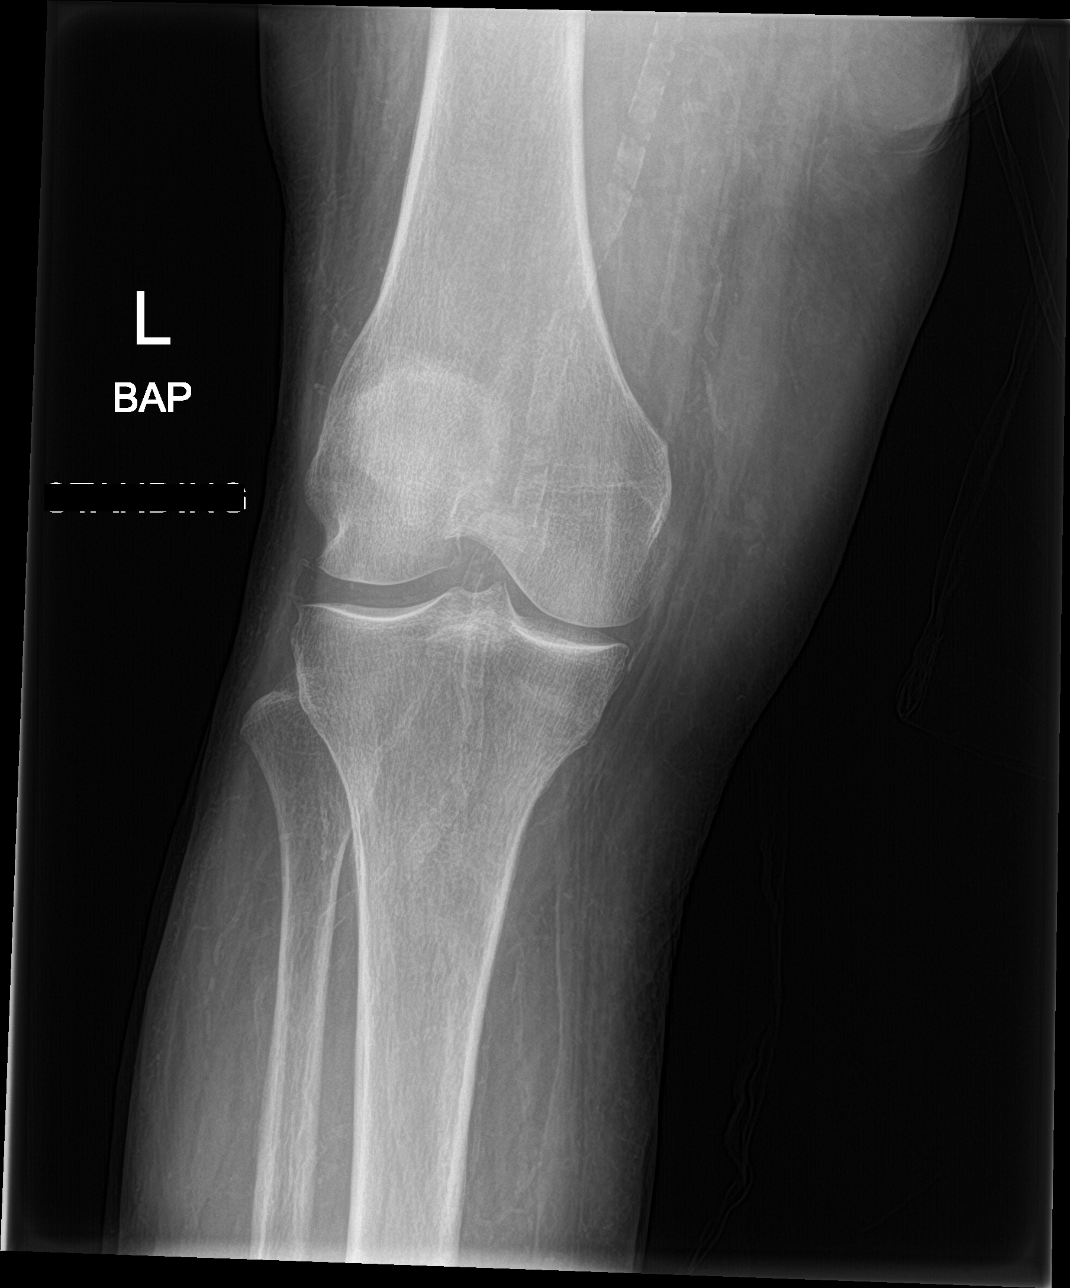

[knee lat]
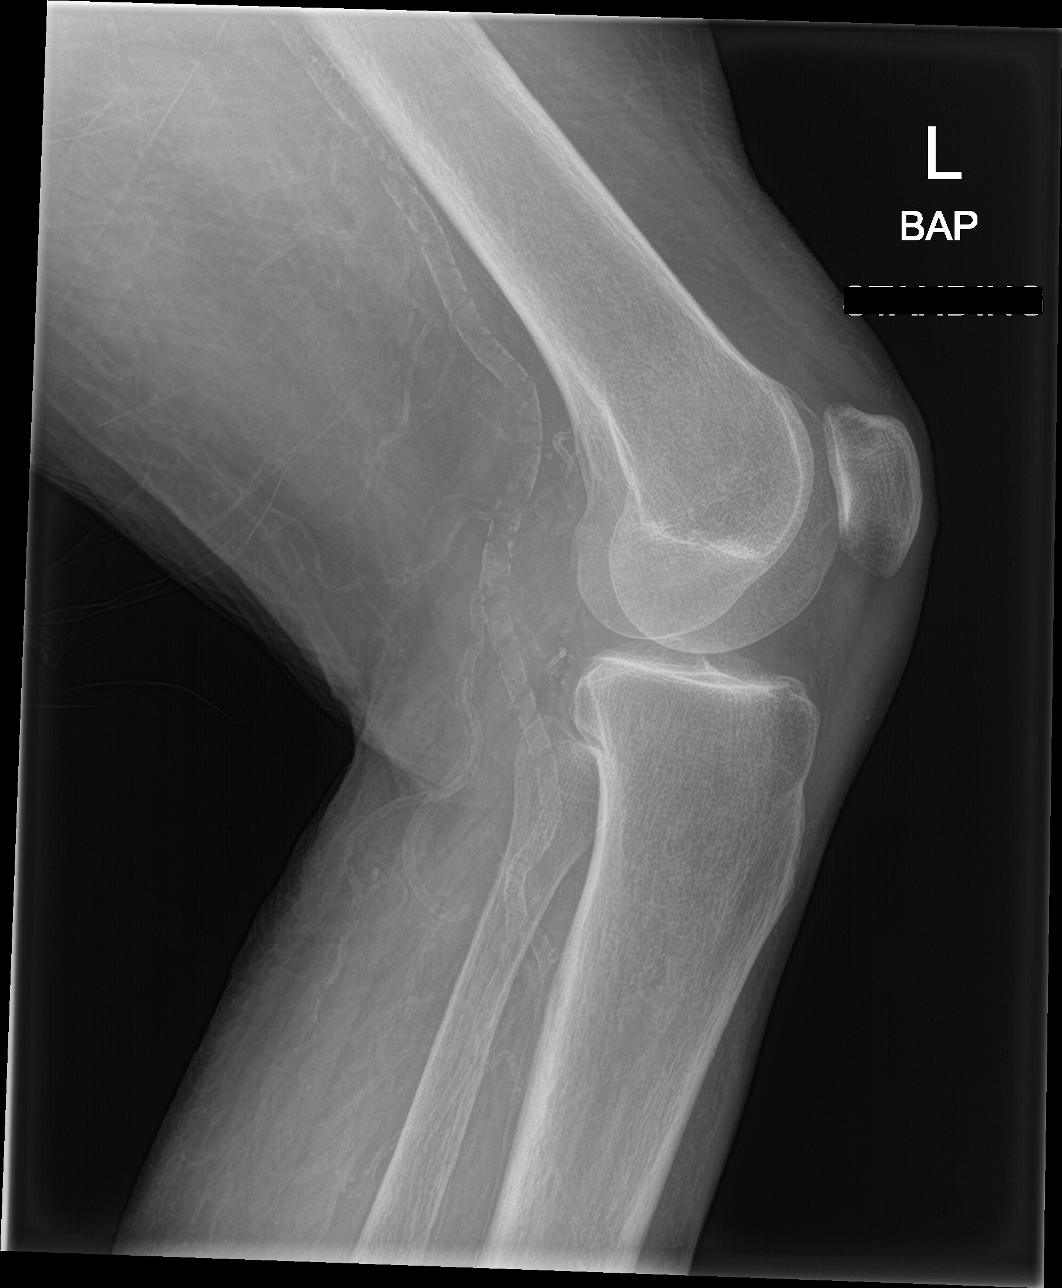

[knee sunrise]
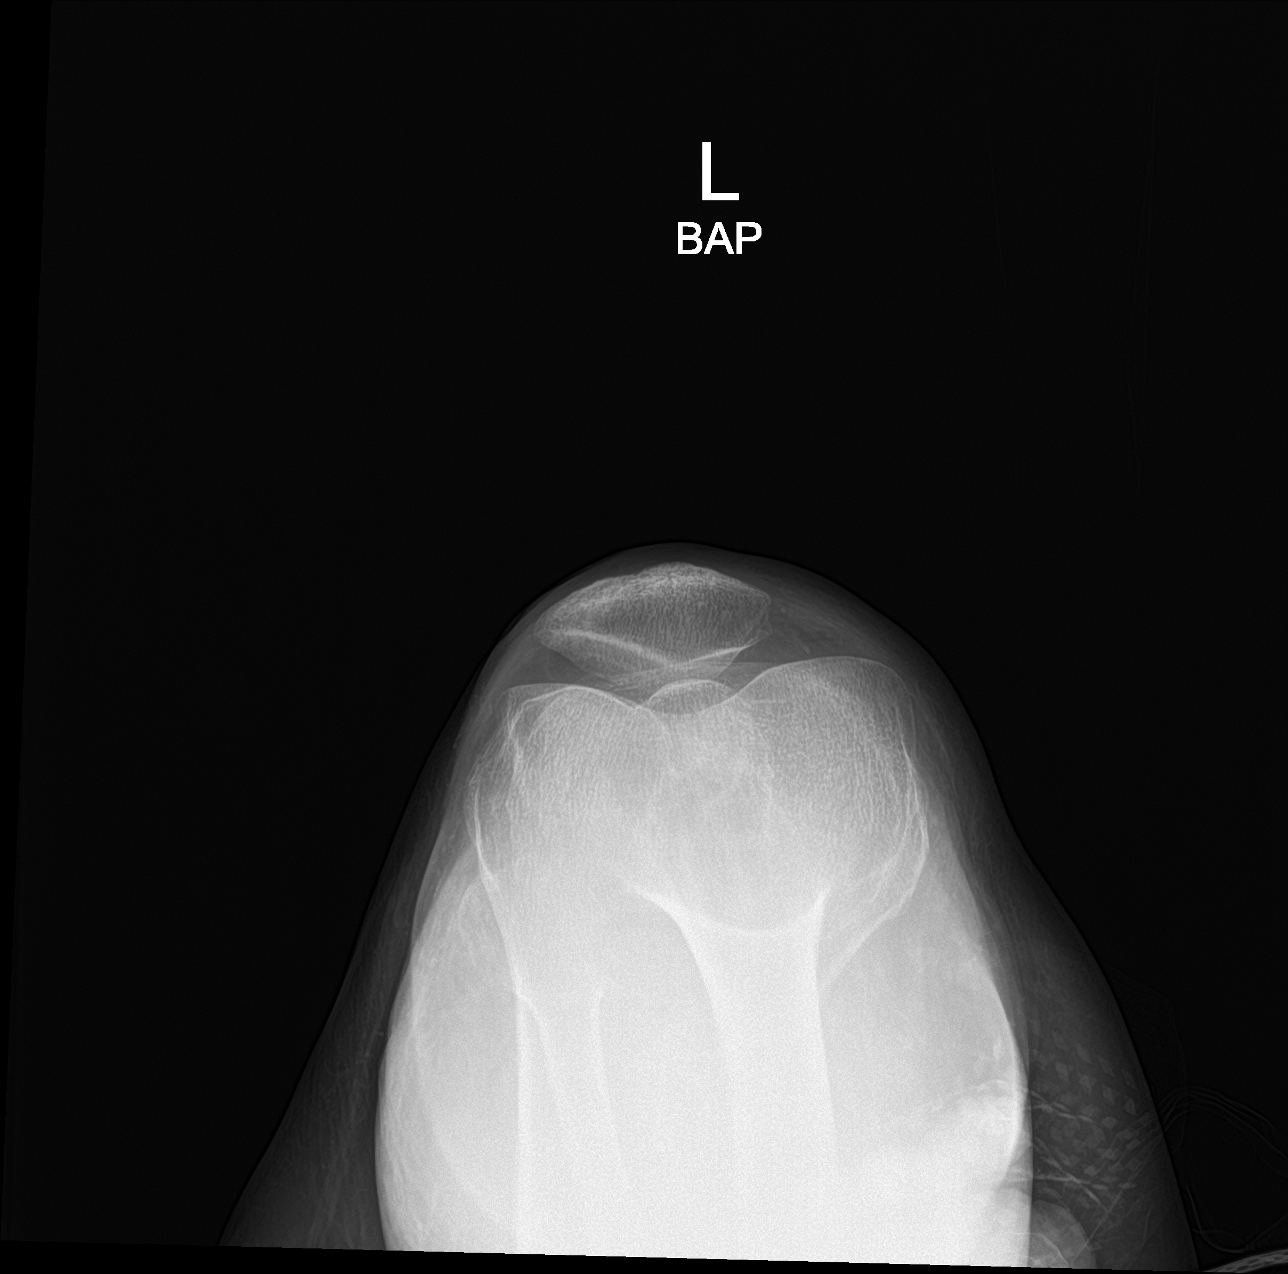

[4 of 4 positions shown; findings below may reference images not displayed]

FINDINGS: No evidence of fracture, dislocation, or joint effusion. No evidence
of arthropathy or other focal bone abnormality. Arterial
calcifications noted.
IMPRESSION: No evidence for acute  abnormality.

## 2018-02-27 DIAGNOSIS — E1110 Type 2 diabetes mellitus with ketoacidosis without coma: Secondary | ICD-10-CM | POA: Diagnosis not present

## 2018-02-27 DIAGNOSIS — N179 Acute kidney failure, unspecified: Secondary | ICD-10-CM | POA: Diagnosis not present

## 2018-02-27 DIAGNOSIS — Z940 Kidney transplant status: Secondary | ICD-10-CM | POA: Diagnosis not present

## 2018-02-27 DIAGNOSIS — S81801A Unspecified open wound, right lower leg, initial encounter: Secondary | ICD-10-CM | POA: Diagnosis not present

## 2018-02-27 DIAGNOSIS — A0472 Enterocolitis due to Clostridium difficile, not specified as recurrent: Secondary | ICD-10-CM | POA: Diagnosis not present

## 2018-02-27 DIAGNOSIS — E872 Acidosis: Secondary | ICD-10-CM | POA: Diagnosis not present

## 2018-02-28 DIAGNOSIS — Z940 Kidney transplant status: Secondary | ICD-10-CM | POA: Diagnosis not present

## 2018-02-28 DIAGNOSIS — E1110 Type 2 diabetes mellitus with ketoacidosis without coma: Secondary | ICD-10-CM | POA: Diagnosis not present

## 2018-02-28 DIAGNOSIS — N179 Acute kidney failure, unspecified: Secondary | ICD-10-CM | POA: Diagnosis not present

## 2018-02-28 DIAGNOSIS — A0472 Enterocolitis due to Clostridium difficile, not specified as recurrent: Secondary | ICD-10-CM | POA: Diagnosis not present

## 2018-02-28 DIAGNOSIS — S81801A Unspecified open wound, right lower leg, initial encounter: Secondary | ICD-10-CM | POA: Diagnosis not present

## 2018-02-28 DIAGNOSIS — E872 Acidosis: Secondary | ICD-10-CM | POA: Diagnosis not present

## 2018-03-01 DIAGNOSIS — S81801A Unspecified open wound, right lower leg, initial encounter: Secondary | ICD-10-CM | POA: Diagnosis not present

## 2018-03-01 DIAGNOSIS — E1110 Type 2 diabetes mellitus with ketoacidosis without coma: Secondary | ICD-10-CM | POA: Diagnosis not present

## 2018-03-01 DIAGNOSIS — N179 Acute kidney failure, unspecified: Secondary | ICD-10-CM | POA: Diagnosis not present

## 2018-03-01 DIAGNOSIS — Z940 Kidney transplant status: Secondary | ICD-10-CM | POA: Diagnosis not present

## 2018-03-01 DIAGNOSIS — A0472 Enterocolitis due to Clostridium difficile, not specified as recurrent: Secondary | ICD-10-CM | POA: Diagnosis not present

## 2018-03-01 DIAGNOSIS — E872 Acidosis: Secondary | ICD-10-CM | POA: Diagnosis not present

## 2018-03-01 IMAGING — CT CT HEAD W/O CM
3 of 4 series · 16 of 47 positions shown, 19 images · non-contrast
Comparison: Brain MRI 04/10/2016

CLINICAL DATA: Confusion. Altered mental status. Diabetic
ketoacidosis.

EXAM:
CT HEAD WITHOUT CONTRAST
TECHNIQUE: Contiguous axial images were obtained from the base of the skull
through the vertex without intravenous contrast.

[Series 201: head w/o, idose (1) · axial · non-contrast · 0.51mm/px · z∈[+291,+451]mm · 10 of 38 slices shown, 13 images]
[im 3/38  brain]
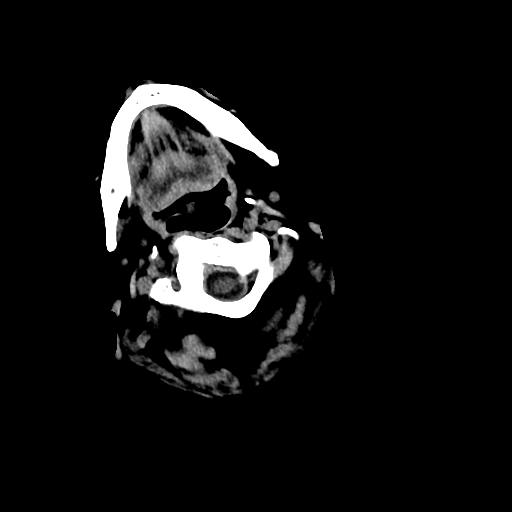
[im 3/38  bone]
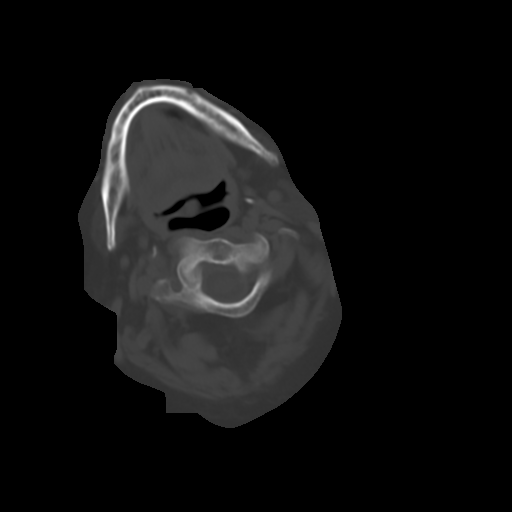
[im 6/38  brain]
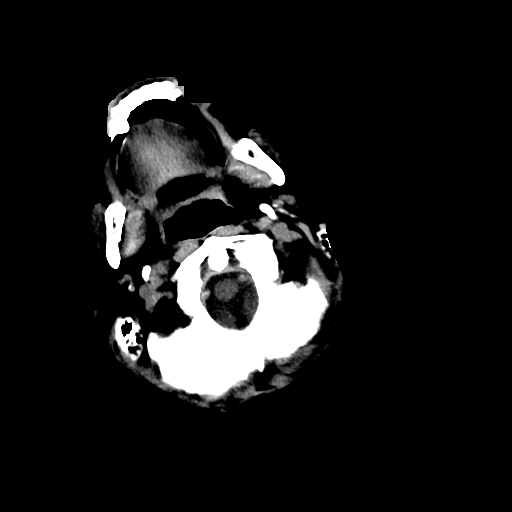
[im 11/38  brain]
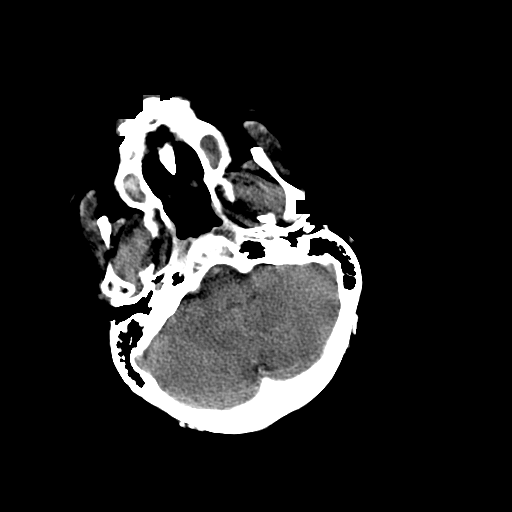
[im 14/38  brain]
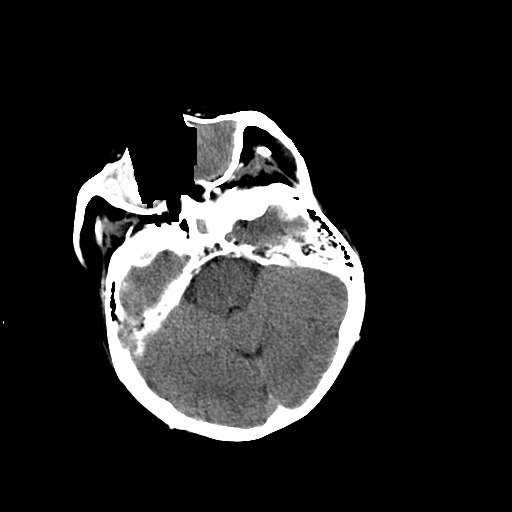
[im 16/38  brain]
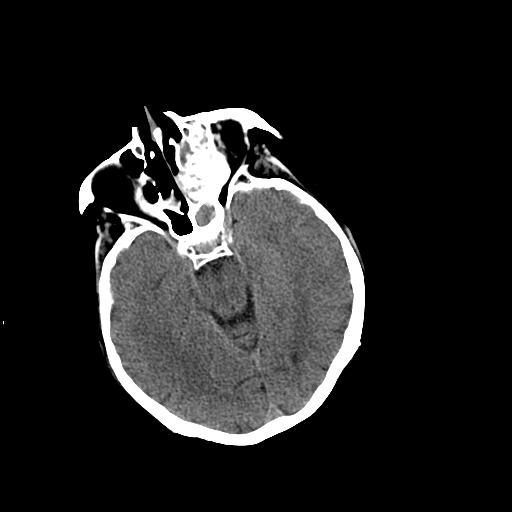
[im 16/38  bone]
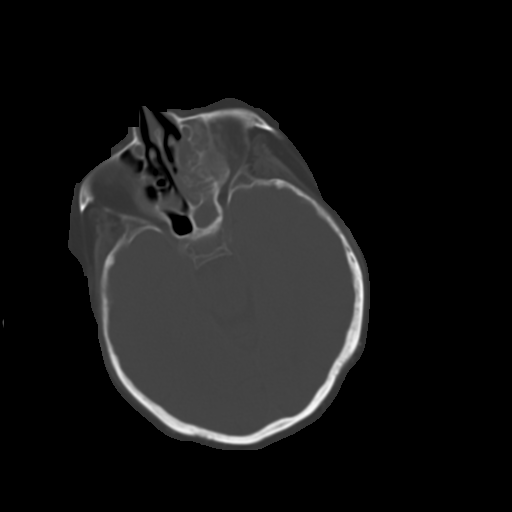
[im 22/38  brain]
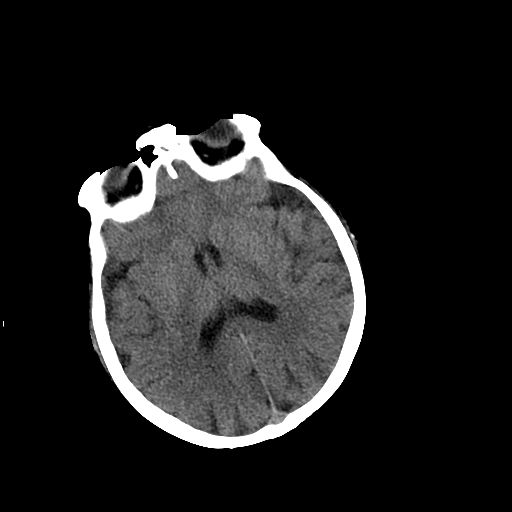
[im 24/38  brain]
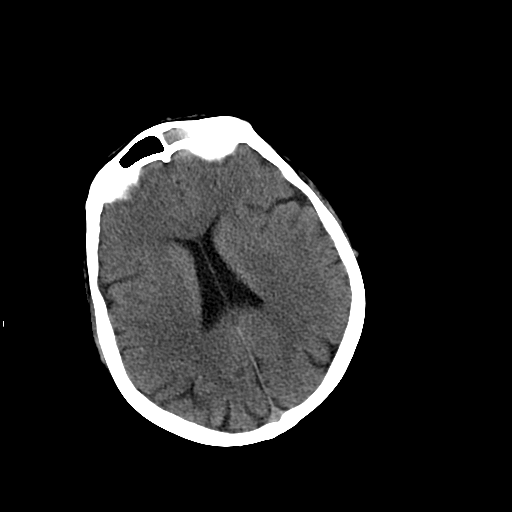
[im 27/38  brain]
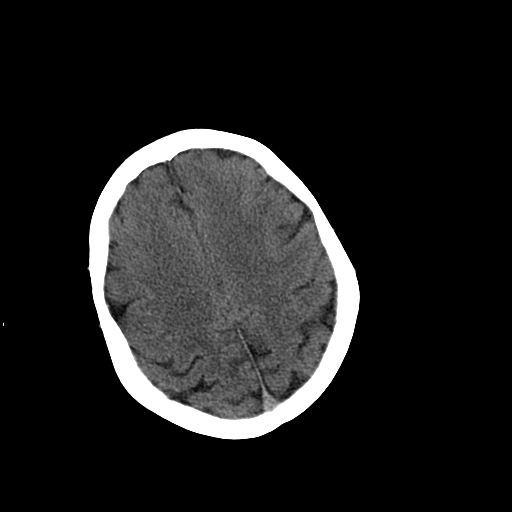
[im 32/38  brain]
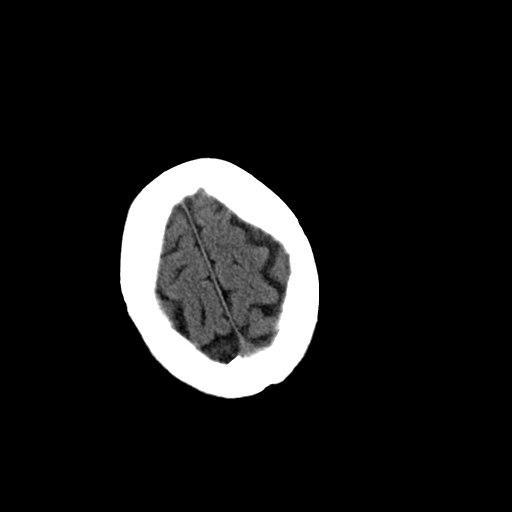
[im 32/38  bone]
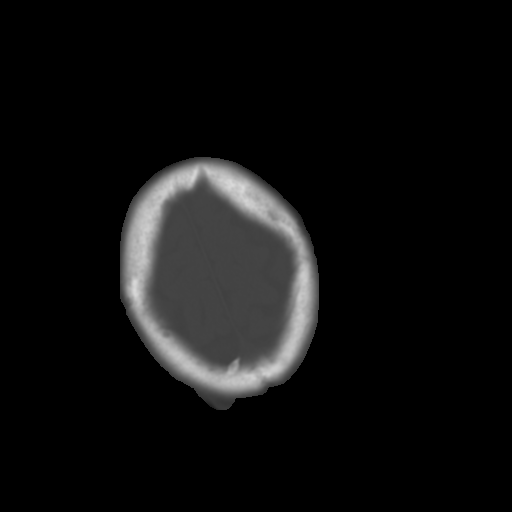
[im 35/38  brain]
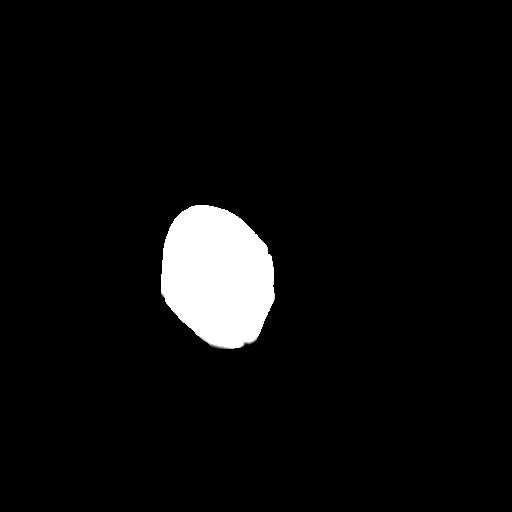

[Series 203: coronal st, idose (1) · coronal · 0.40mm/px · 3 of 78 slices shown]
[im 26/78  brain]
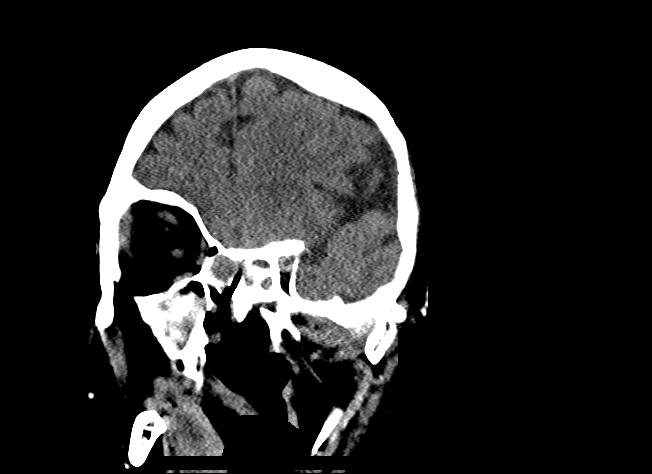
[im 35/78  brain]
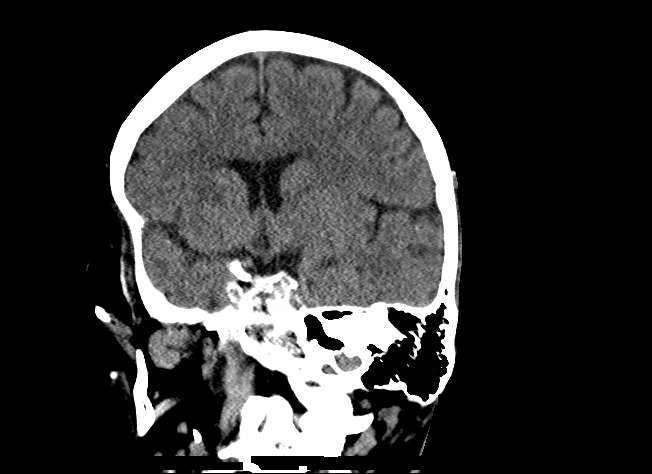
[im 43/78  brain]
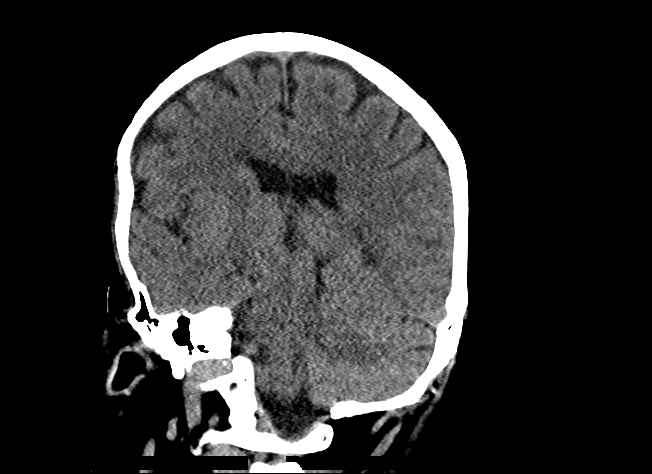

[Series 204: sagittal st, idose (1) · sagittal · 0.40mm/px · 3 of 87 slices shown]
[im 29/87  brain]
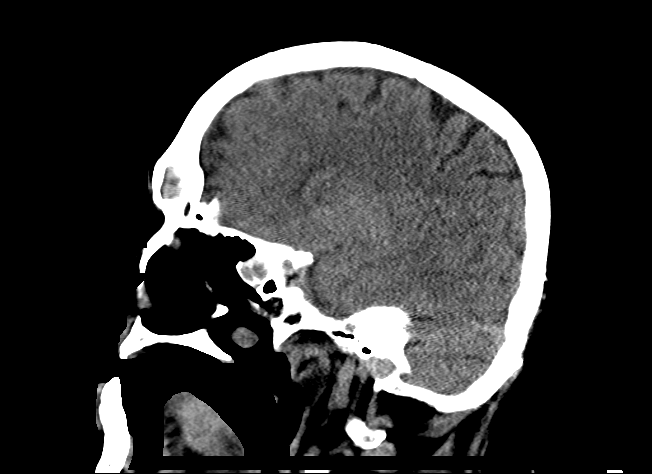
[im 44/87  brain]
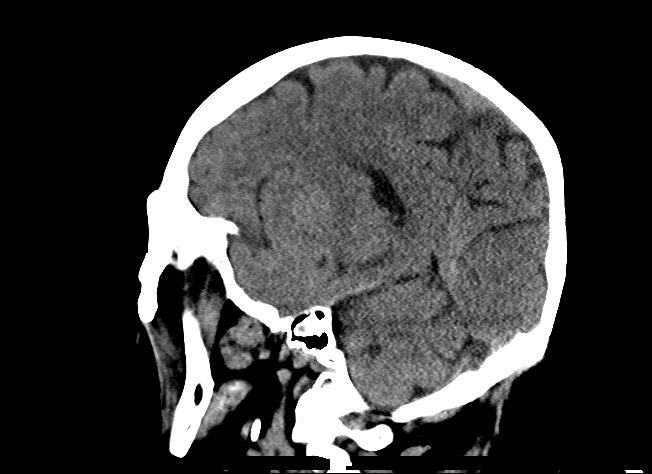
[im 58/87  brain]
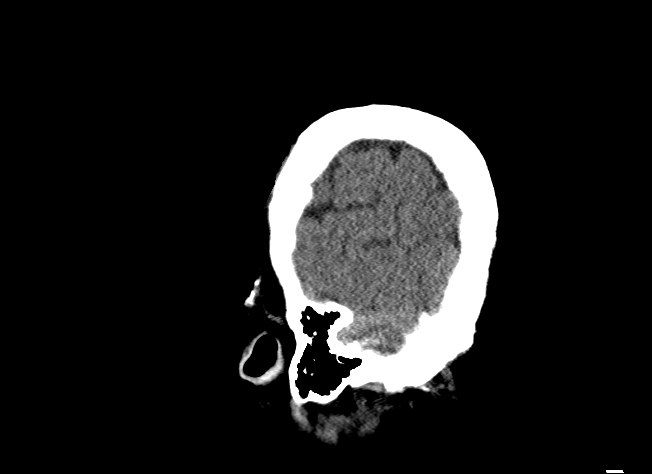

[16 of 47 positions shown; findings below may reference images not displayed]

FINDINGS: Brain: No mass lesion, intraparenchymal hemorrhage or extra-axial
collection. No evidence of acute cortical infarct. Brain parenchyma
and CSF-containing spaces are normal for age.

Vascular: No hyperdense vessel or atherosclerotic calcification.

Skull: Normal visualized skull base, calvarium and extracranial soft
tissues.

Sinuses/Orbits: Complete opacification of the maxillary sinuses,
left frontal sinus and left sphenoid sinus with near complete
opacification of the bilateral ethmoid sinuses. Normal orbits.
IMPRESSION: 1. No acute intracranial abnormality.
2. Findings of chronic sinusitis including complete opacification of
the left frontal, left sphenoid and bilateral maxillary sinuses.

## 2018-03-02 DIAGNOSIS — Z940 Kidney transplant status: Secondary | ICD-10-CM | POA: Diagnosis not present

## 2018-03-02 DIAGNOSIS — E1110 Type 2 diabetes mellitus with ketoacidosis without coma: Secondary | ICD-10-CM | POA: Diagnosis not present

## 2018-03-02 DIAGNOSIS — N179 Acute kidney failure, unspecified: Secondary | ICD-10-CM | POA: Diagnosis not present

## 2018-03-02 DIAGNOSIS — A0472 Enterocolitis due to Clostridium difficile, not specified as recurrent: Secondary | ICD-10-CM | POA: Diagnosis not present

## 2018-03-02 DIAGNOSIS — S81801A Unspecified open wound, right lower leg, initial encounter: Secondary | ICD-10-CM | POA: Diagnosis not present

## 2018-03-02 DIAGNOSIS — E872 Acidosis: Secondary | ICD-10-CM | POA: Diagnosis not present

## 2018-03-09 IMAGING — US US RENAL
1 series · 14 of 25 positions shown · non-contrast
Comparison: CT 11/05/2017, ultrasound 06/05/2015

CLINICAL DATA: Acute kidney injury, transplanted kidney

EXAM:
ULTRASOUND OF RENAL TRANSPLANT
TECHNIQUE: Ultrasound examination of the renal transplant was performed with
gray-scale and color Doppler evaluation.

[Series 1: us renal · 0.23mm/px · 14 of 28 slices shown]
[im 1/28]
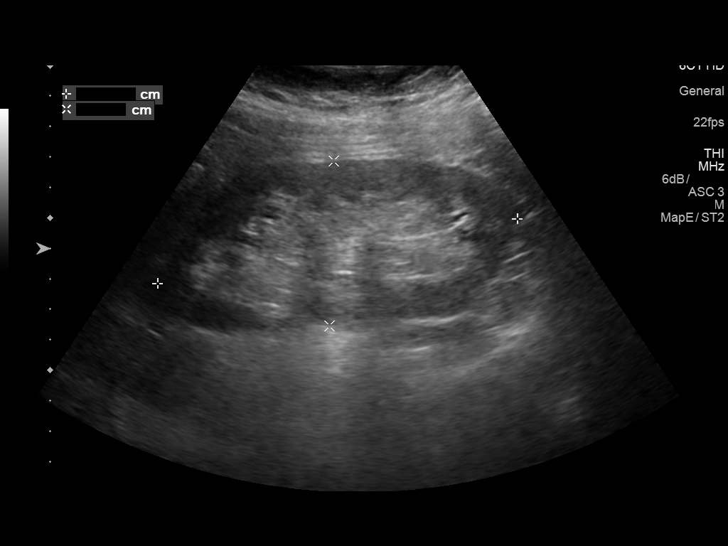
[im 3/28]
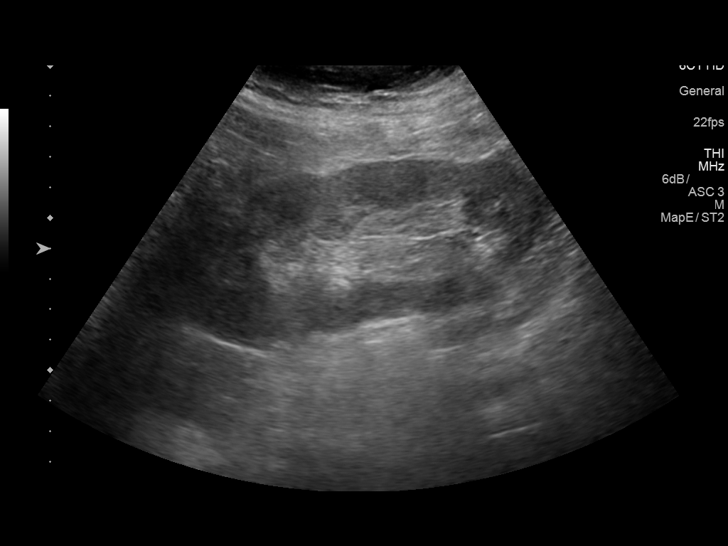
[im 5/28]
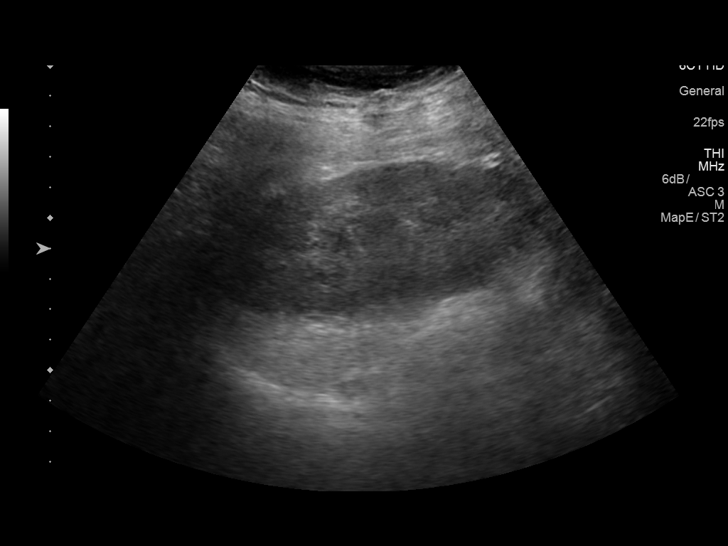
[im 7/28]
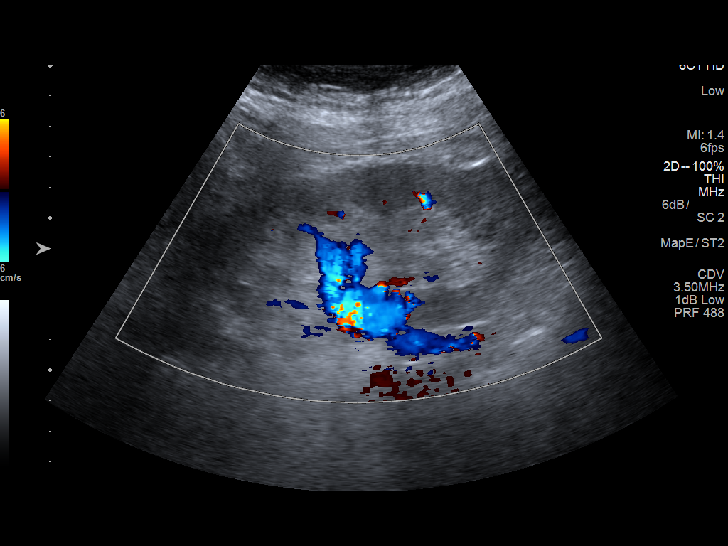
[im 10/28]
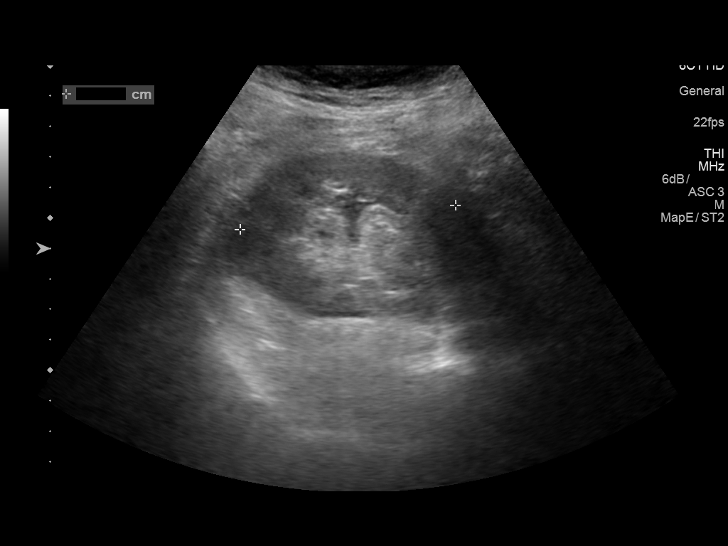
[im 11/28]
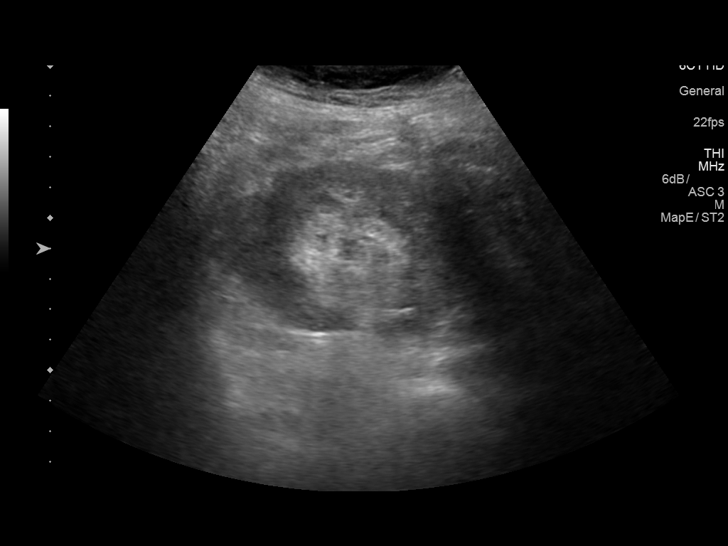
[im 13/28]
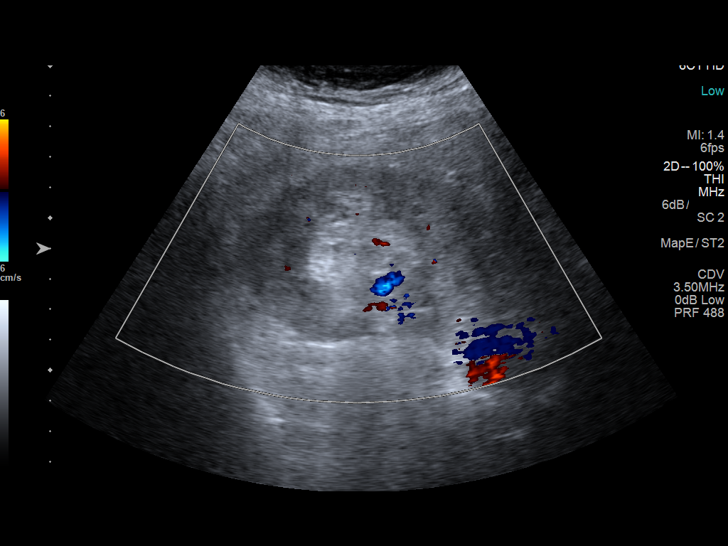
[im 15/28]
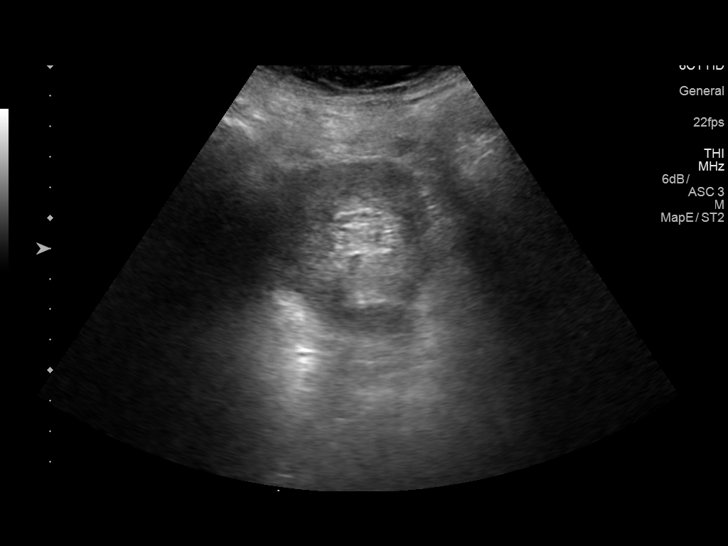
[im 17/28]
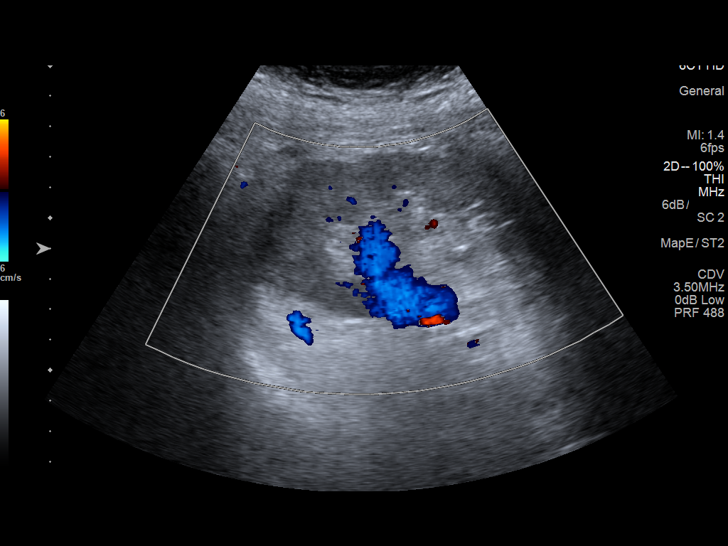
[im 19/28]
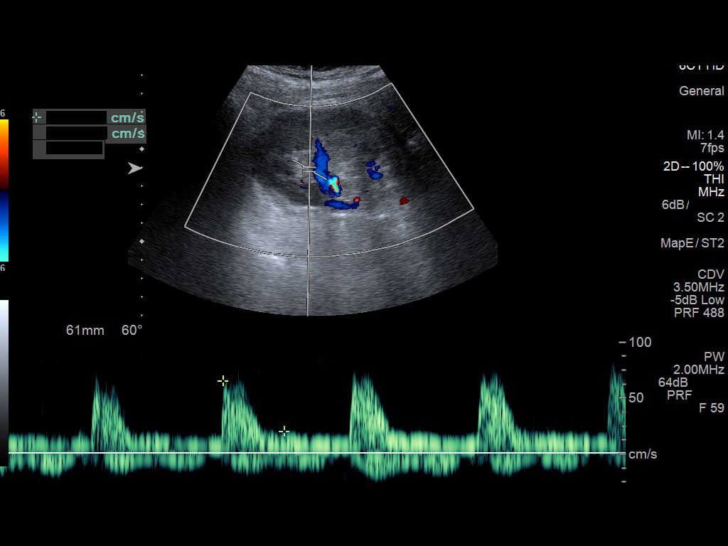
[im 21/28]
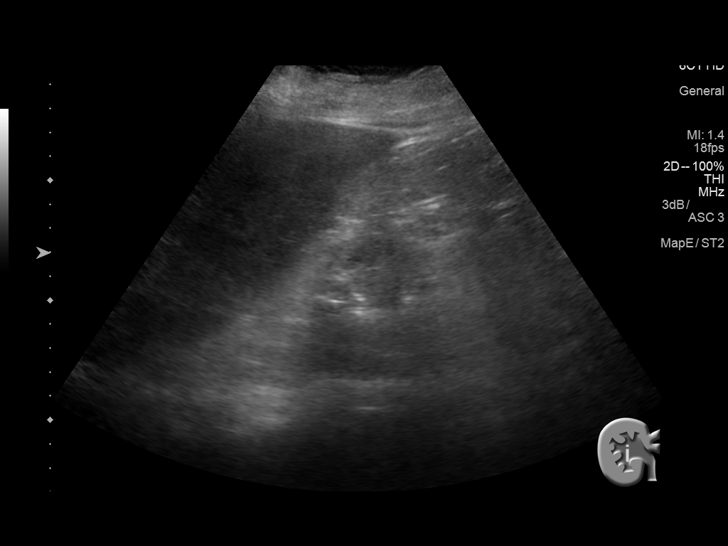
[im 23/28]
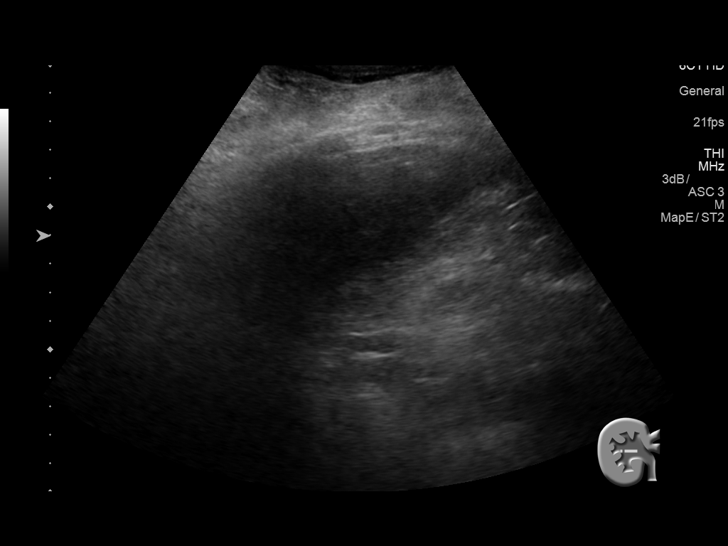
[im 25/28]
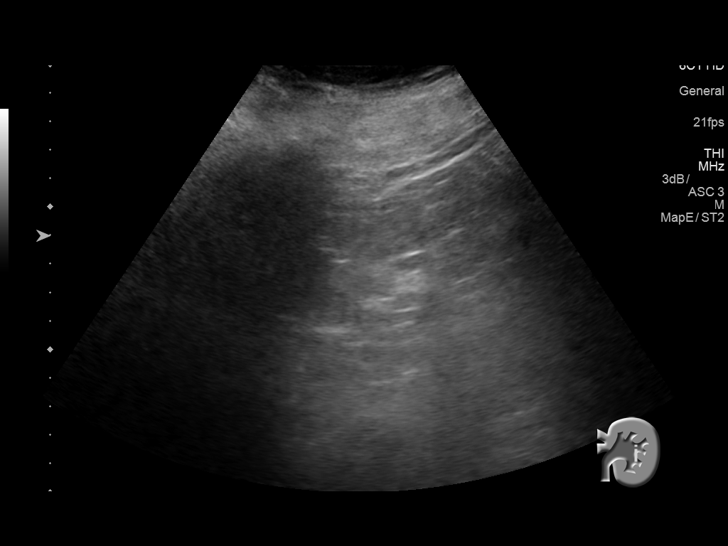
[im 28/28]
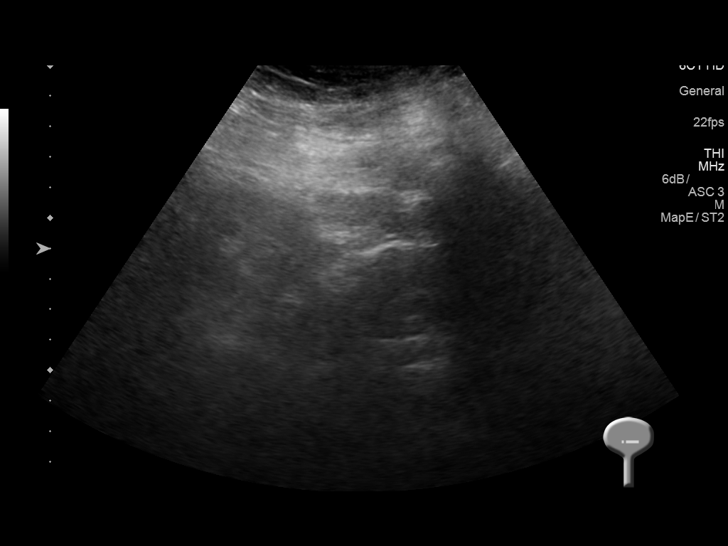

[14 of 25 positions shown; findings below may reference images not displayed]

FINDINGS: Transplant kidney location:  Right lower quadrant

Transplant kidney description: Length: 12 cm. Within normal limits
in parenchymal echogenicity. No evidence of mass or hydronephrosis.
No peri-transplant fluid collection seen.

Color flow in the main renal artery at the hilum:  Visualized

Color flow in the main renal vein at the hilum:  Visualized

Bladder: Foley catheter in the bladder.

Other findings: Atrophic echogenic native right kidney. Poorly
visible native left kidney.
IMPRESSION: Negative ultrasound appearance of the right lower quadrant
transplant kidney.

## 2018-03-15 DIAGNOSIS — R739 Hyperglycemia, unspecified: Secondary | ICD-10-CM

## 2018-04-15 IMAGING — DX DG TIBIA/FIBULA 2V*R*
4 series · 4 of 4 positions shown · non-contrast
Comparison: 05/27/2011

CLINICAL DATA: Wound cellulitis.  Rule out osteomyelitis.

EXAM:
RIGHT TIBIA AND FIBULA - 2 VIEW

[tibia ap (1 of 2)]
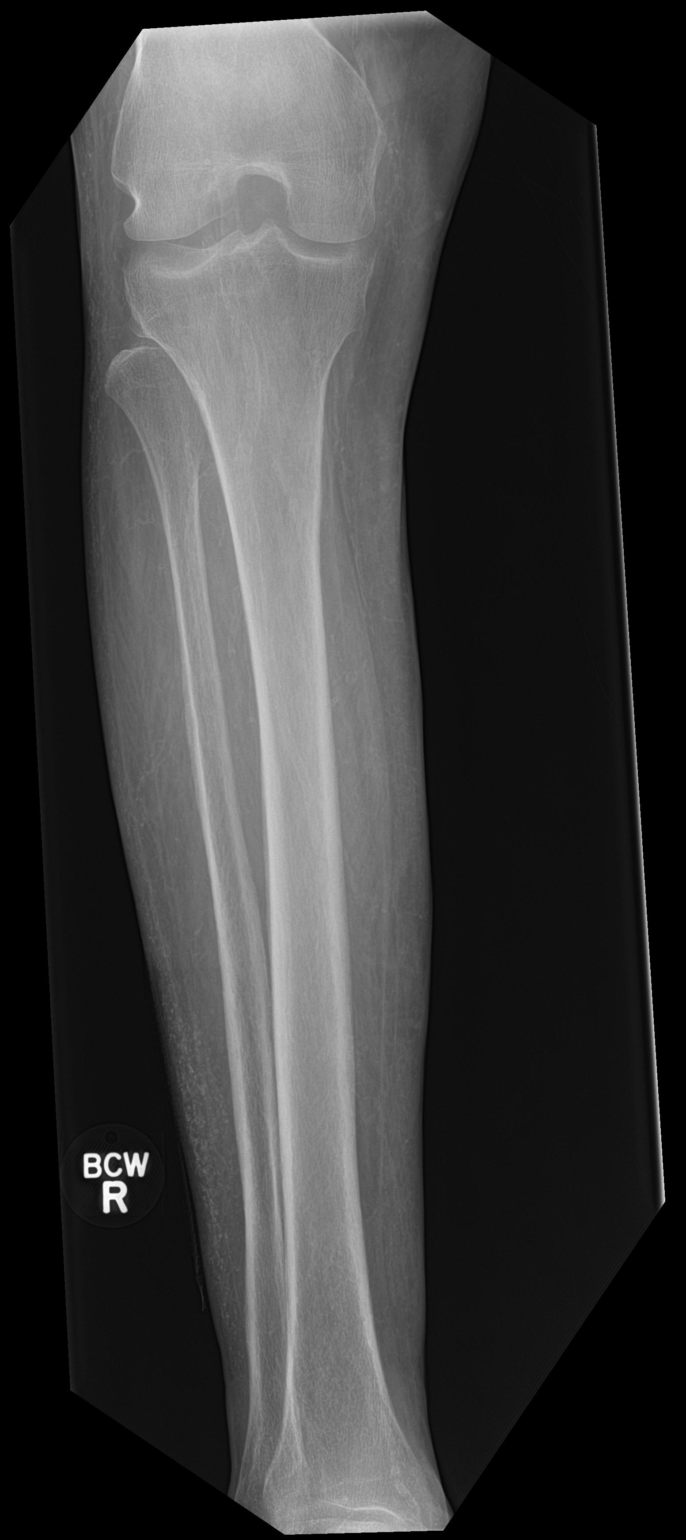

[tibia ap (2 of 2)]
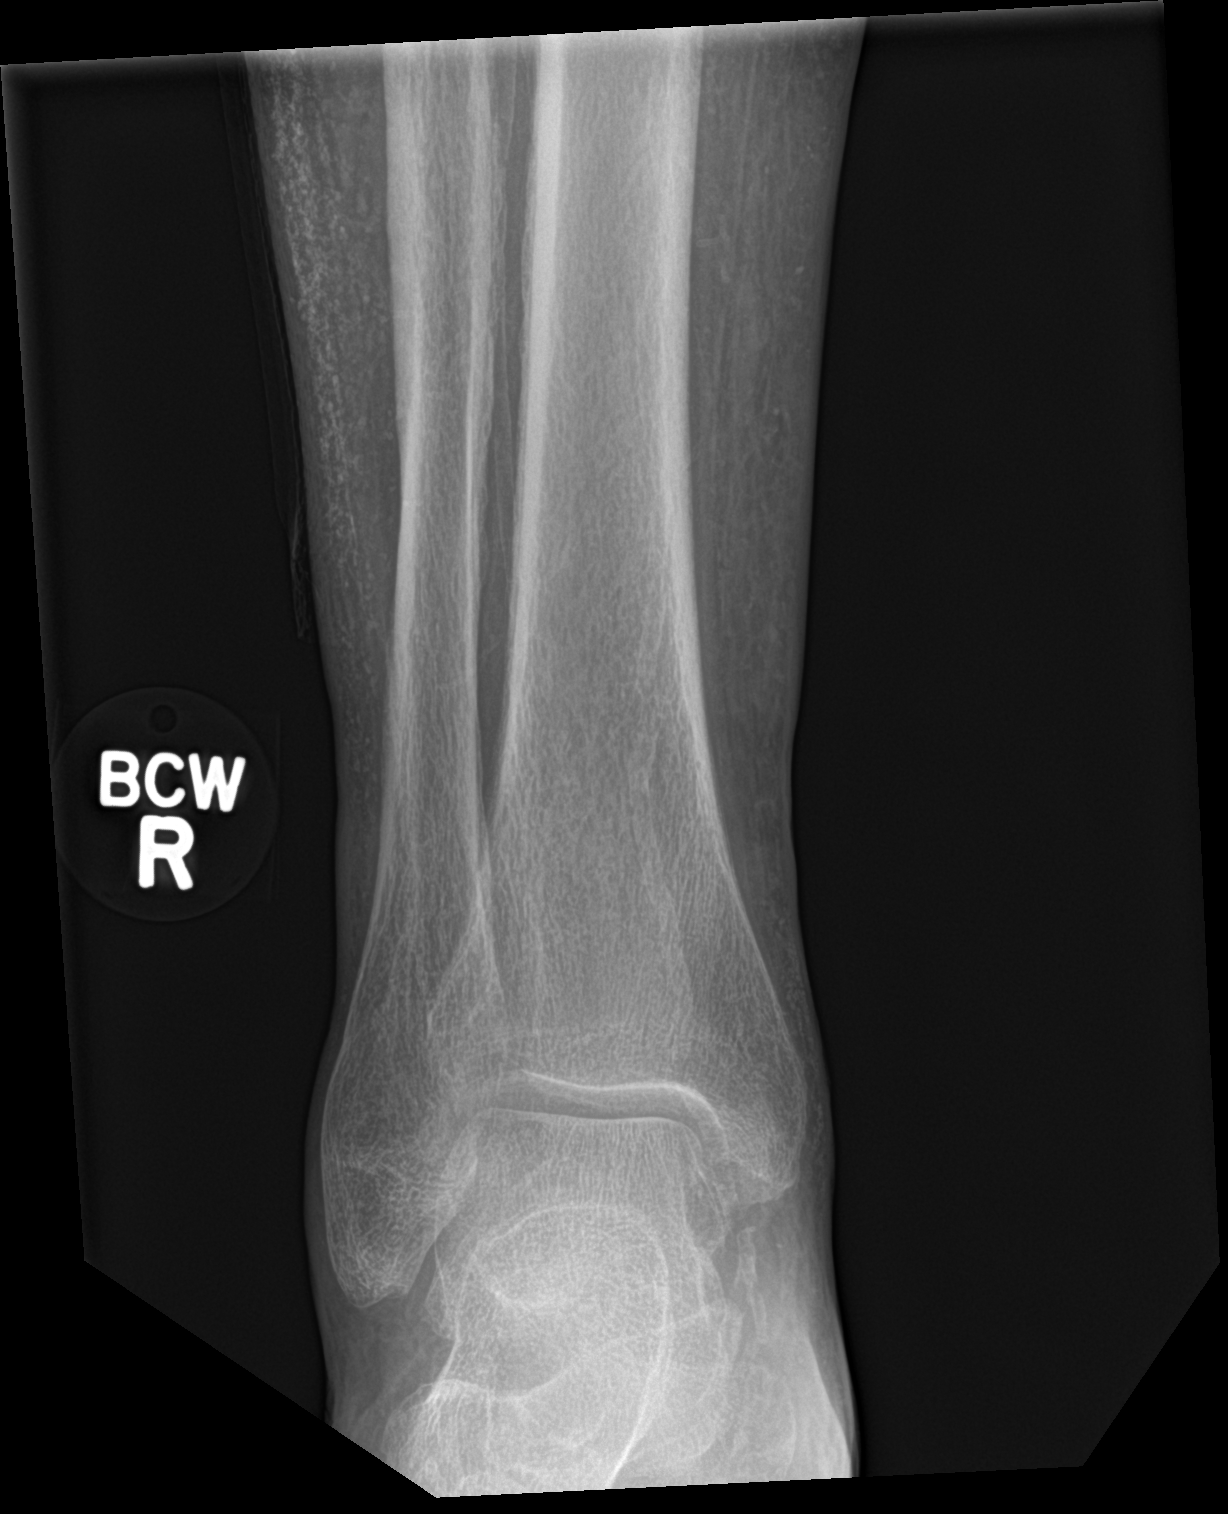

[tibia lat (1 of 2)]
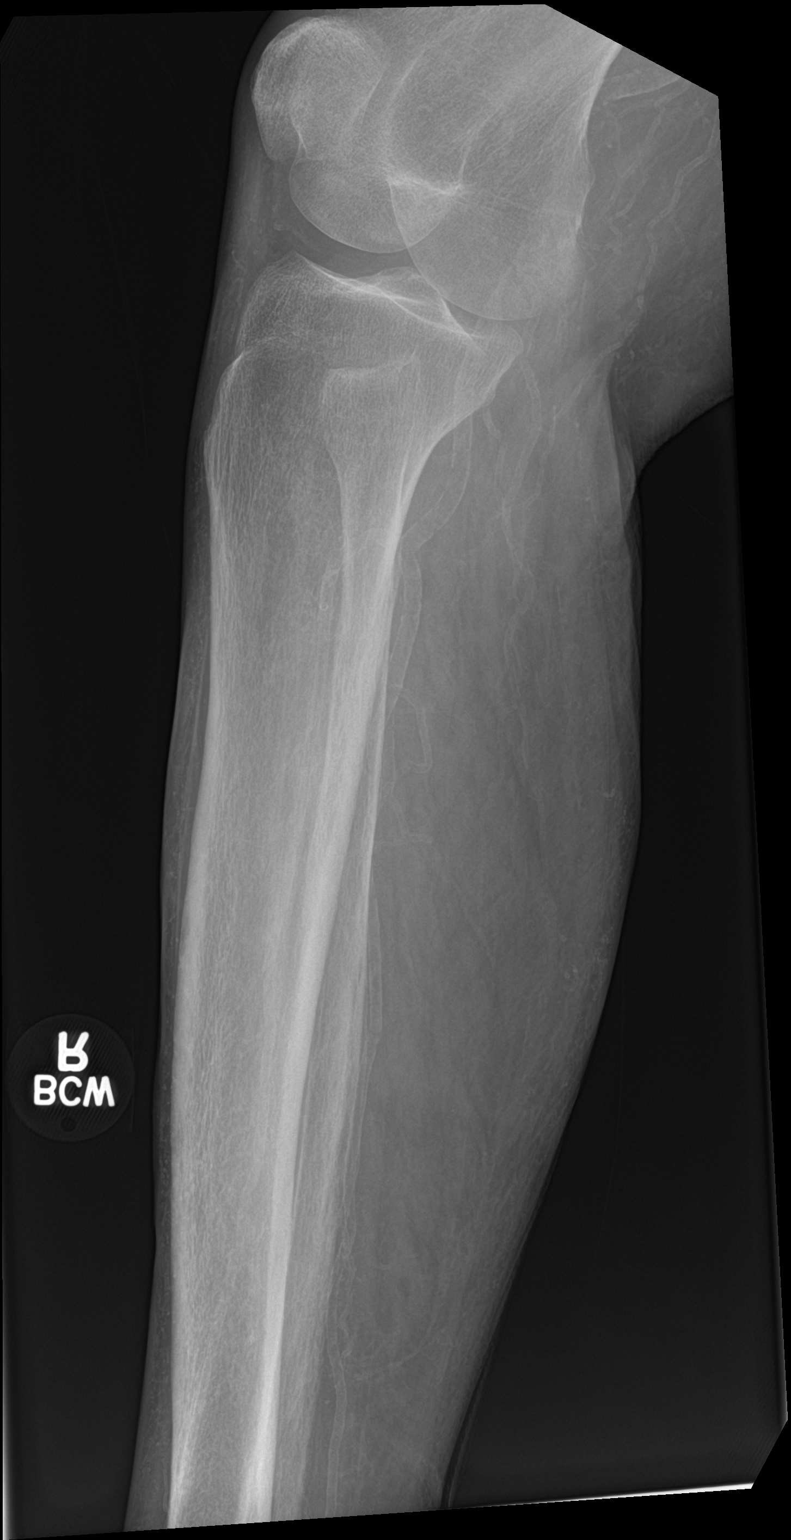

[tibia lat (2 of 2)]
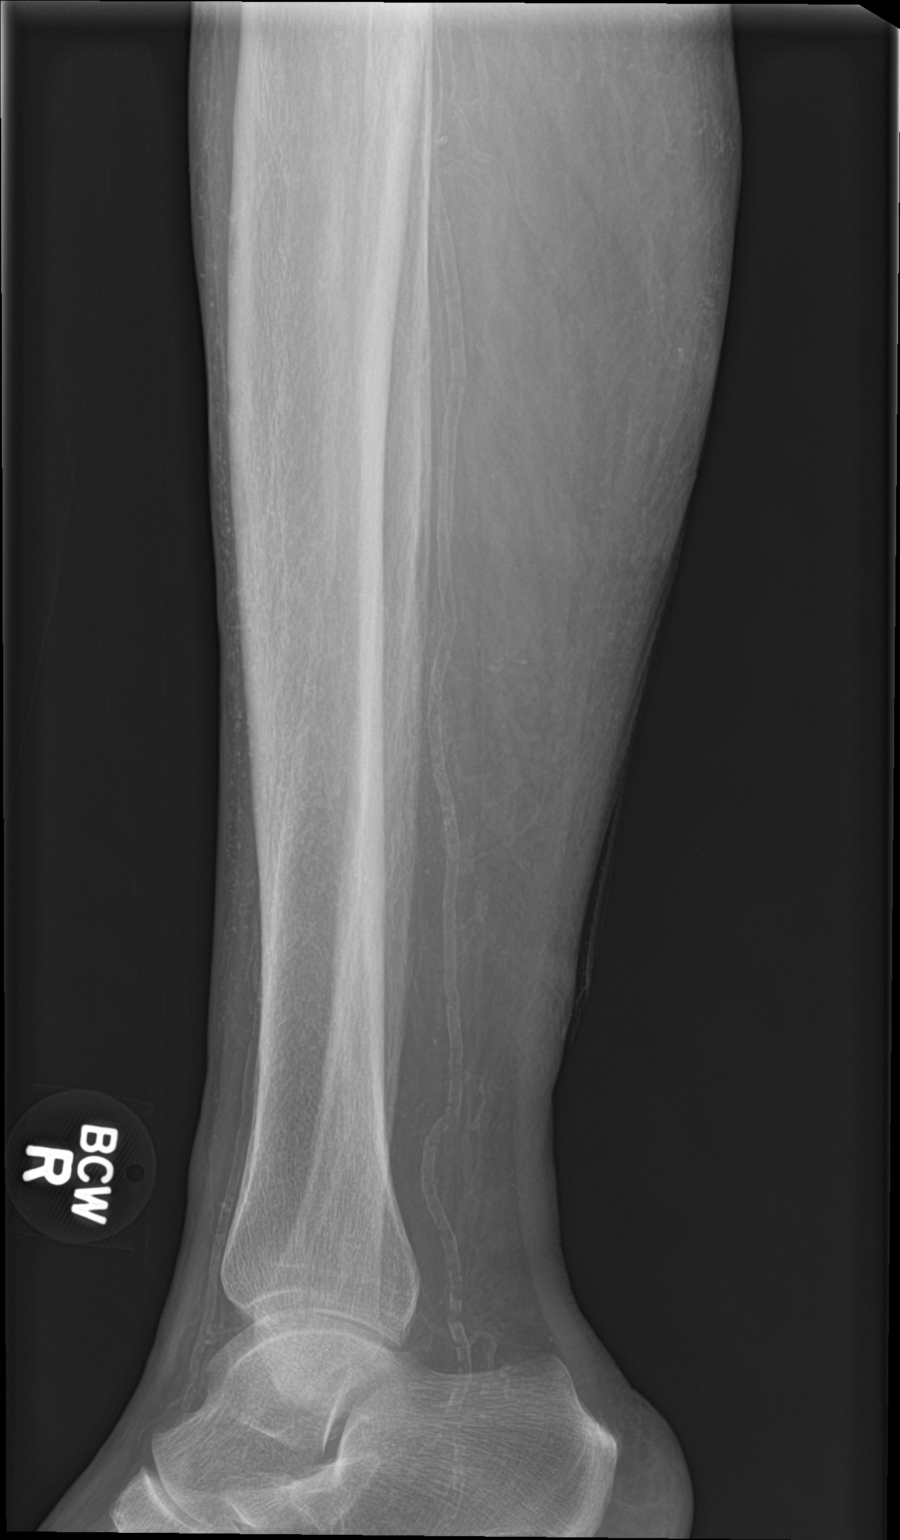

[4 of 4 positions shown; findings below may reference images not displayed]

FINDINGS: Again noted are skin densities or calcifications along the lateral
aspect of the lower leg. There is no evidence for a fracture or
dislocation. Diffuse vascular calcifications. No evidence for
periosteal reaction or cortical destruction.
IMPRESSION: No acute abnormality.

## 2018-04-26 ENCOUNTER — Ambulatory Visit: Payer: Medicare Other | Admitting: Endocrinology

## 2018-05-04 DIAGNOSIS — D649 Anemia, unspecified: Secondary | ICD-10-CM | POA: Diagnosis not present

## 2018-05-04 DIAGNOSIS — E1159 Type 2 diabetes mellitus with other circulatory complications: Secondary | ICD-10-CM

## 2018-05-04 DIAGNOSIS — E1110 Type 2 diabetes mellitus with ketoacidosis without coma: Secondary | ICD-10-CM

## 2018-05-04 DIAGNOSIS — Z940 Kidney transplant status: Secondary | ICD-10-CM

## 2018-05-04 DIAGNOSIS — N184 Chronic kidney disease, stage 4 (severe): Secondary | ICD-10-CM

## 2018-05-04 DIAGNOSIS — M869 Osteomyelitis, unspecified: Secondary | ICD-10-CM | POA: Diagnosis not present

## 2018-05-04 DIAGNOSIS — E039 Hypothyroidism, unspecified: Secondary | ICD-10-CM

## 2018-05-04 DIAGNOSIS — E11628 Type 2 diabetes mellitus with other skin complications: Secondary | ICD-10-CM

## 2018-05-05 DIAGNOSIS — M869 Osteomyelitis, unspecified: Secondary | ICD-10-CM | POA: Diagnosis not present

## 2018-05-05 DIAGNOSIS — E039 Hypothyroidism, unspecified: Secondary | ICD-10-CM | POA: Diagnosis not present

## 2018-05-05 DIAGNOSIS — N184 Chronic kidney disease, stage 4 (severe): Secondary | ICD-10-CM | POA: Diagnosis not present

## 2018-05-05 DIAGNOSIS — D649 Anemia, unspecified: Secondary | ICD-10-CM | POA: Diagnosis not present

## 2018-05-06 DIAGNOSIS — D649 Anemia, unspecified: Secondary | ICD-10-CM | POA: Diagnosis not present

## 2018-05-06 DIAGNOSIS — E039 Hypothyroidism, unspecified: Secondary | ICD-10-CM | POA: Diagnosis not present

## 2018-05-06 DIAGNOSIS — M869 Osteomyelitis, unspecified: Secondary | ICD-10-CM | POA: Diagnosis not present

## 2018-05-06 DIAGNOSIS — N184 Chronic kidney disease, stage 4 (severe): Secondary | ICD-10-CM | POA: Diagnosis not present

## 2018-05-07 DIAGNOSIS — D649 Anemia, unspecified: Secondary | ICD-10-CM | POA: Diagnosis not present

## 2018-05-07 DIAGNOSIS — E039 Hypothyroidism, unspecified: Secondary | ICD-10-CM | POA: Diagnosis not present

## 2018-05-07 DIAGNOSIS — N184 Chronic kidney disease, stage 4 (severe): Secondary | ICD-10-CM | POA: Diagnosis not present

## 2018-05-07 DIAGNOSIS — M869 Osteomyelitis, unspecified: Secondary | ICD-10-CM | POA: Diagnosis not present

## 2018-05-08 DIAGNOSIS — D649 Anemia, unspecified: Secondary | ICD-10-CM | POA: Diagnosis not present

## 2018-05-08 DIAGNOSIS — M869 Osteomyelitis, unspecified: Secondary | ICD-10-CM | POA: Diagnosis not present

## 2018-05-08 DIAGNOSIS — E039 Hypothyroidism, unspecified: Secondary | ICD-10-CM | POA: Diagnosis not present

## 2018-05-08 DIAGNOSIS — N184 Chronic kidney disease, stage 4 (severe): Secondary | ICD-10-CM | POA: Diagnosis not present

## 2018-05-09 DIAGNOSIS — M869 Osteomyelitis, unspecified: Secondary | ICD-10-CM | POA: Diagnosis not present

## 2018-05-09 DIAGNOSIS — N184 Chronic kidney disease, stage 4 (severe): Secondary | ICD-10-CM | POA: Diagnosis not present

## 2018-05-09 DIAGNOSIS — D649 Anemia, unspecified: Secondary | ICD-10-CM | POA: Diagnosis not present

## 2018-05-09 DIAGNOSIS — E039 Hypothyroidism, unspecified: Secondary | ICD-10-CM | POA: Diagnosis not present

## 2018-05-10 DIAGNOSIS — E039 Hypothyroidism, unspecified: Secondary | ICD-10-CM | POA: Diagnosis not present

## 2018-05-10 DIAGNOSIS — D649 Anemia, unspecified: Secondary | ICD-10-CM | POA: Diagnosis not present

## 2018-05-10 DIAGNOSIS — N184 Chronic kidney disease, stage 4 (severe): Secondary | ICD-10-CM | POA: Diagnosis not present

## 2018-05-10 DIAGNOSIS — M869 Osteomyelitis, unspecified: Secondary | ICD-10-CM | POA: Diagnosis not present

## 2018-05-11 DIAGNOSIS — M869 Osteomyelitis, unspecified: Secondary | ICD-10-CM | POA: Diagnosis not present

## 2018-05-11 DIAGNOSIS — N184 Chronic kidney disease, stage 4 (severe): Secondary | ICD-10-CM | POA: Diagnosis not present

## 2018-05-11 DIAGNOSIS — E039 Hypothyroidism, unspecified: Secondary | ICD-10-CM | POA: Diagnosis not present

## 2018-05-11 DIAGNOSIS — D649 Anemia, unspecified: Secondary | ICD-10-CM | POA: Diagnosis not present

## 2018-05-12 DIAGNOSIS — E039 Hypothyroidism, unspecified: Secondary | ICD-10-CM | POA: Diagnosis not present

## 2018-05-12 DIAGNOSIS — D649 Anemia, unspecified: Secondary | ICD-10-CM | POA: Diagnosis not present

## 2018-05-12 DIAGNOSIS — M869 Osteomyelitis, unspecified: Secondary | ICD-10-CM | POA: Diagnosis not present

## 2018-05-12 DIAGNOSIS — N184 Chronic kidney disease, stage 4 (severe): Secondary | ICD-10-CM | POA: Diagnosis not present

## 2018-05-13 DIAGNOSIS — N184 Chronic kidney disease, stage 4 (severe): Secondary | ICD-10-CM | POA: Diagnosis not present

## 2018-05-13 DIAGNOSIS — M869 Osteomyelitis, unspecified: Secondary | ICD-10-CM | POA: Diagnosis not present

## 2018-05-13 DIAGNOSIS — E039 Hypothyroidism, unspecified: Secondary | ICD-10-CM | POA: Diagnosis not present

## 2018-05-13 DIAGNOSIS — D649 Anemia, unspecified: Secondary | ICD-10-CM | POA: Diagnosis not present

## 2018-05-17 ENCOUNTER — Ambulatory Visit: Payer: Medicare Other | Admitting: Endocrinology

## 2018-05-17 DIAGNOSIS — Z0289 Encounter for other administrative examinations: Secondary | ICD-10-CM

## 2018-05-31 DIAGNOSIS — E162 Hypoglycemia, unspecified: Secondary | ICD-10-CM

## 2018-05-31 DIAGNOSIS — T68XXXA Hypothermia, initial encounter: Secondary | ICD-10-CM

## 2018-05-31 DIAGNOSIS — J9601 Acute respiratory failure with hypoxia: Secondary | ICD-10-CM

## 2018-05-31 DIAGNOSIS — E872 Acidosis: Secondary | ICD-10-CM

## 2018-05-31 DIAGNOSIS — D649 Anemia, unspecified: Secondary | ICD-10-CM

## 2018-05-31 DIAGNOSIS — N179 Acute kidney failure, unspecified: Secondary | ICD-10-CM

## 2018-05-31 DIAGNOSIS — Z89511 Acquired absence of right leg below knee: Secondary | ICD-10-CM

## 2018-05-31 DIAGNOSIS — Z940 Kidney transplant status: Secondary | ICD-10-CM

## 2018-05-31 DIAGNOSIS — E039 Hypothyroidism, unspecified: Secondary | ICD-10-CM

## 2018-05-31 DIAGNOSIS — N183 Chronic kidney disease, stage 3 (moderate): Secondary | ICD-10-CM

## 2018-05-31 DIAGNOSIS — E785 Hyperlipidemia, unspecified: Secondary | ICD-10-CM

## 2018-05-31 IMAGING — CR DG CHEST 2V
2 series · 2 of 2 positions shown · non-contrast
Comparison: Portable exam 04/15/2016

CLINICAL DATA: Diabetic ketoacidosis.

EXAM:
CHEST  2 VIEW

[chest lat]
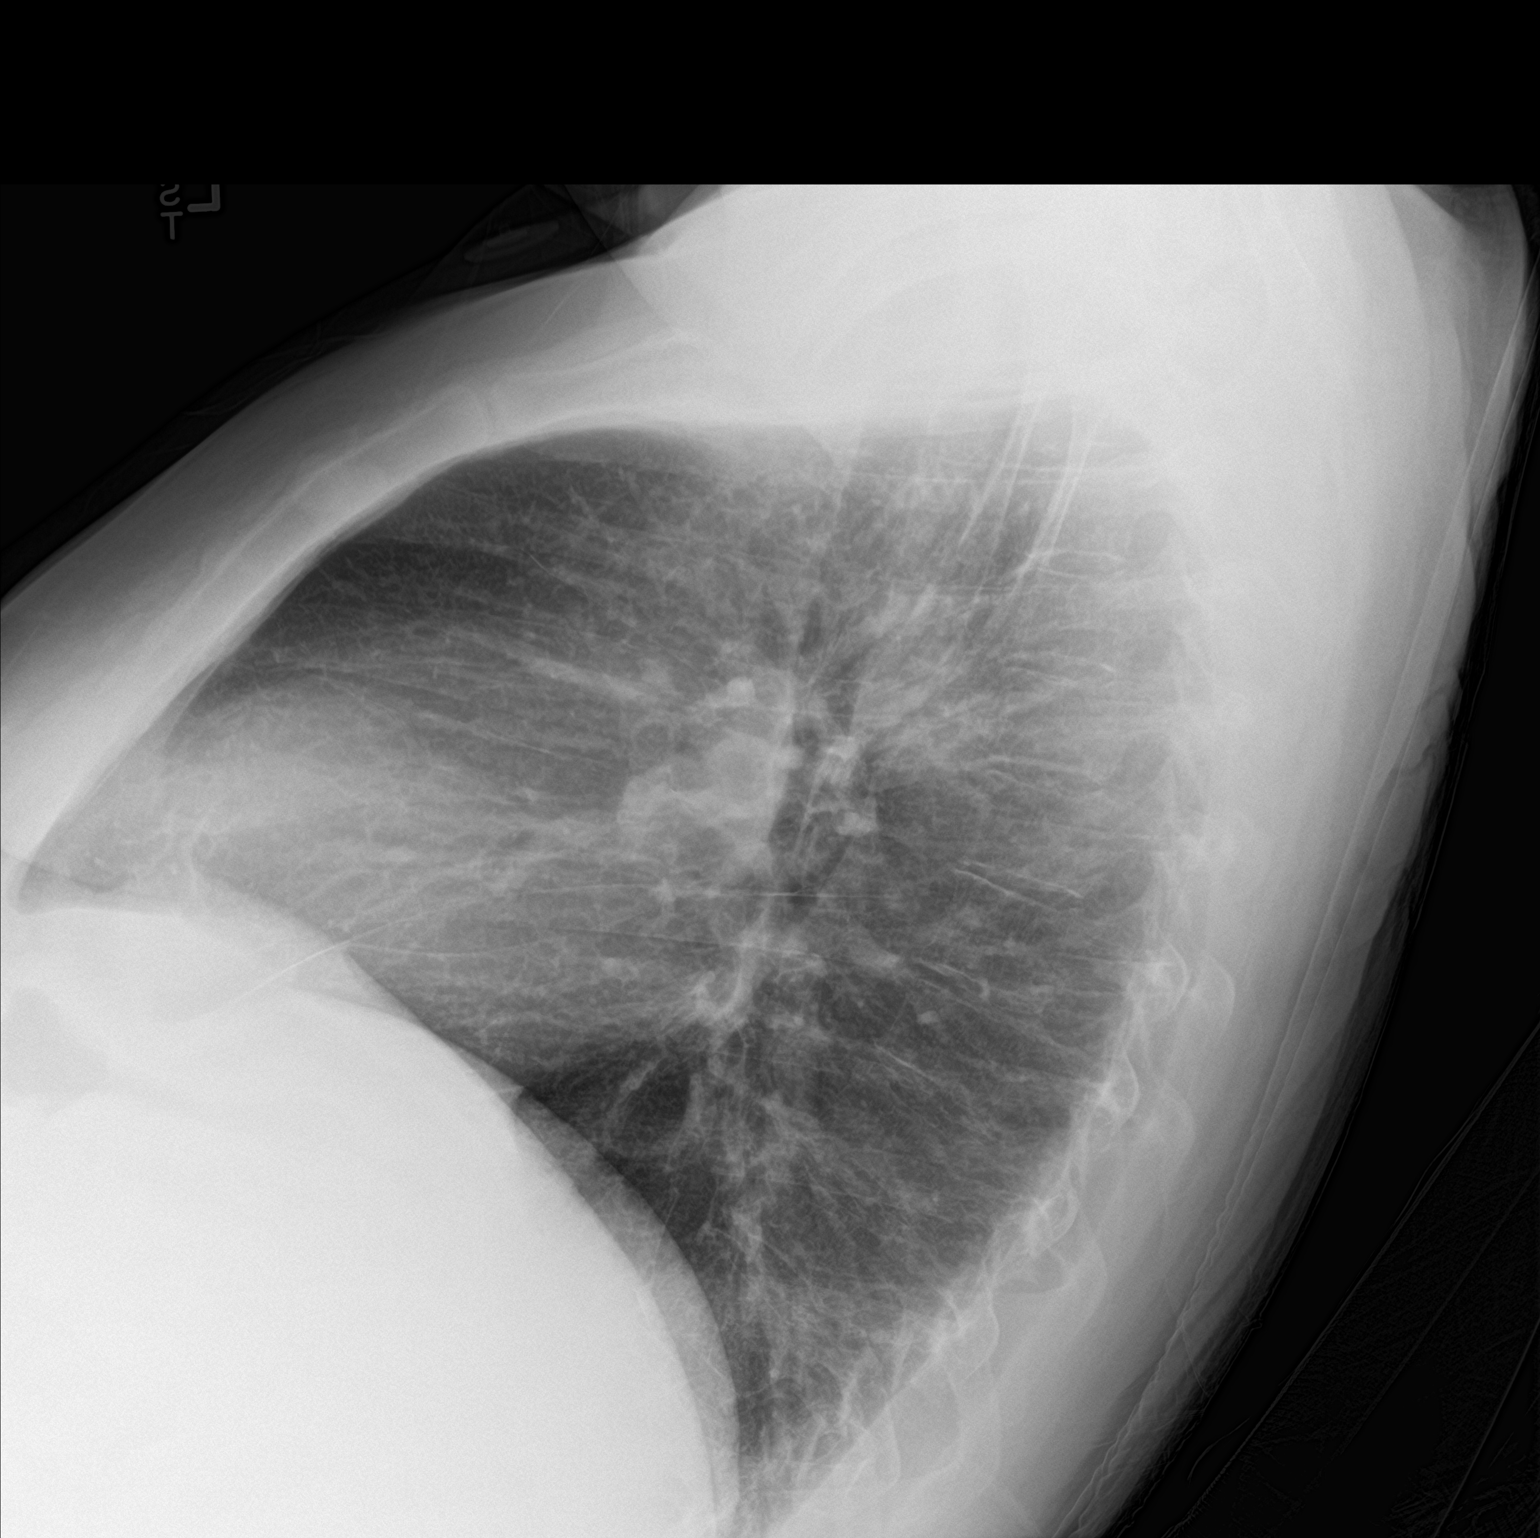

[chest ap]
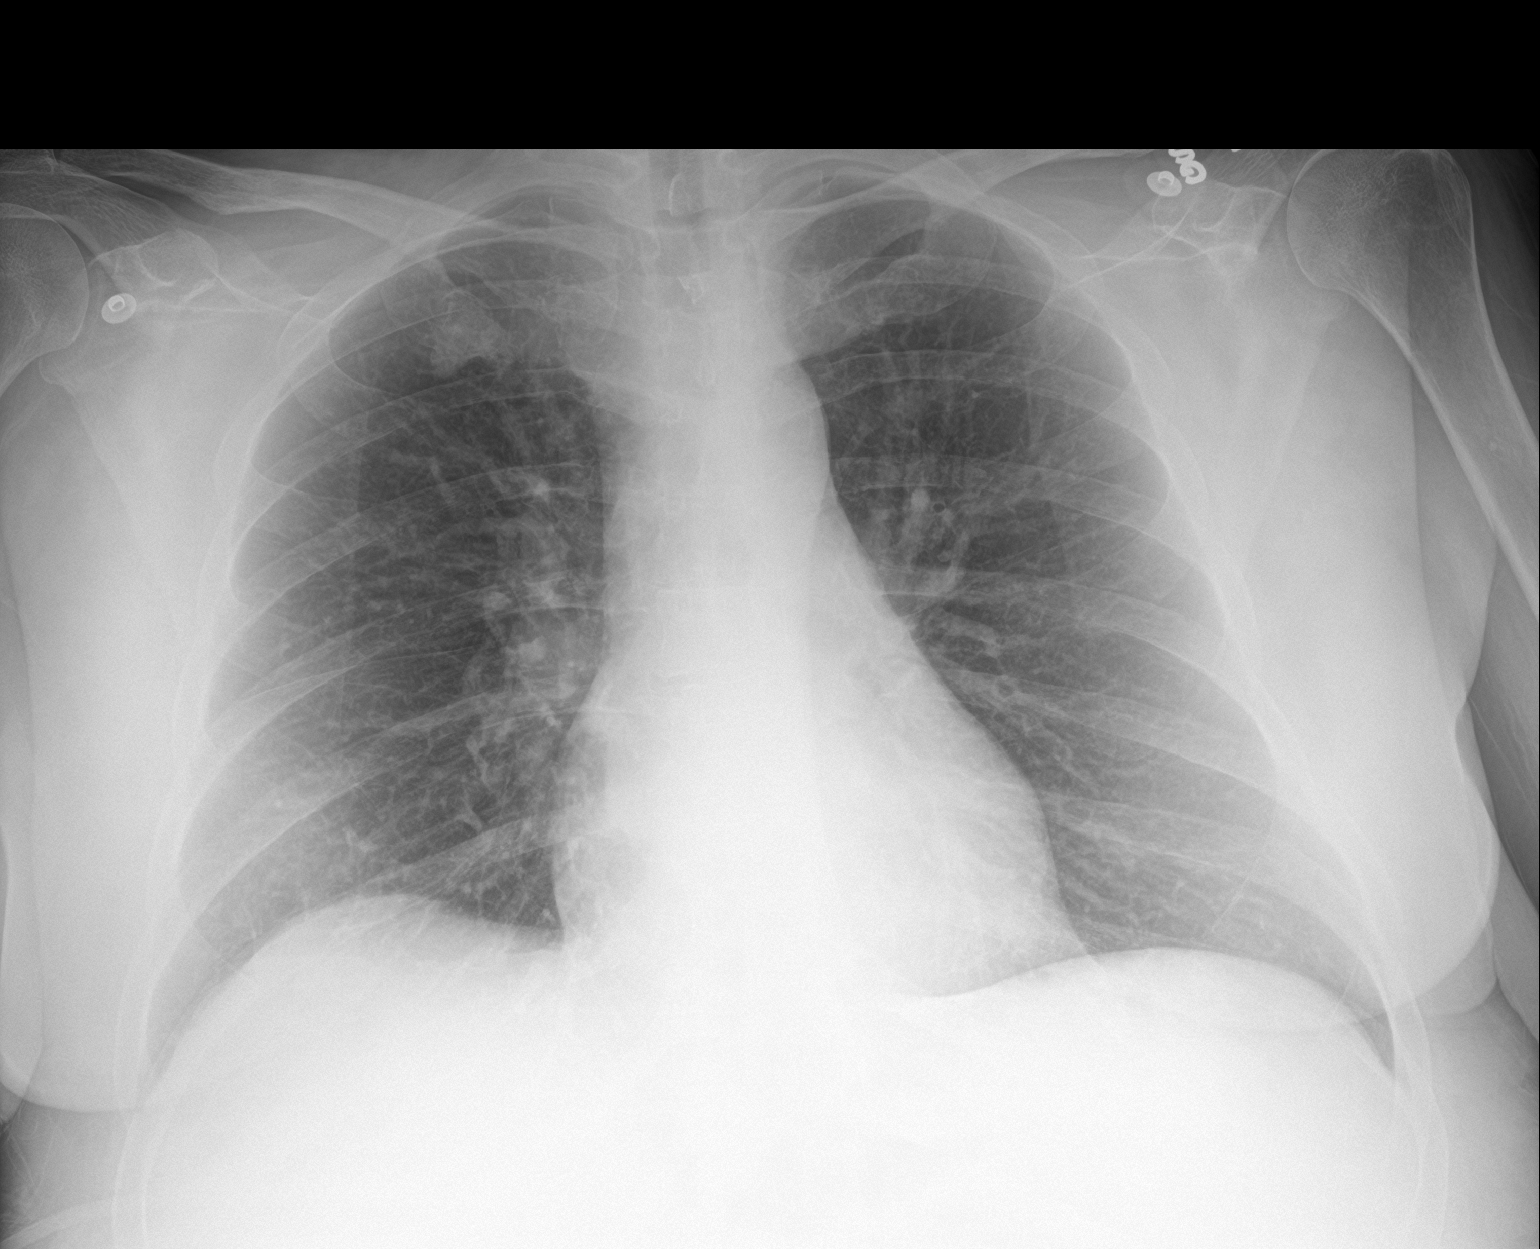

[2 of 2 positions shown; findings below may reference images not displayed]

FINDINGS: The cardiomediastinal contours are normal. Mild chronic central
bronchitic change. Pulmonary vasculature is normal. No
consolidation, pleural effusion, or pneumothorax. No acute osseous
abnormalities are seen.
IMPRESSION: No acute abnormality.

## 2018-06-01 DIAGNOSIS — E872 Acidosis: Secondary | ICD-10-CM | POA: Diagnosis not present

## 2018-06-01 DIAGNOSIS — T68XXXA Hypothermia, initial encounter: Secondary | ICD-10-CM | POA: Diagnosis not present

## 2018-06-01 DIAGNOSIS — R739 Hyperglycemia, unspecified: Secondary | ICD-10-CM

## 2018-06-01 DIAGNOSIS — E162 Hypoglycemia, unspecified: Secondary | ICD-10-CM | POA: Diagnosis not present

## 2018-06-01 DIAGNOSIS — Z89511 Acquired absence of right leg below knee: Secondary | ICD-10-CM | POA: Diagnosis not present

## 2018-06-02 DIAGNOSIS — Z89511 Acquired absence of right leg below knee: Secondary | ICD-10-CM | POA: Diagnosis not present

## 2018-06-02 DIAGNOSIS — T68XXXA Hypothermia, initial encounter: Secondary | ICD-10-CM | POA: Diagnosis not present

## 2018-06-02 DIAGNOSIS — E872 Acidosis: Secondary | ICD-10-CM | POA: Diagnosis not present

## 2018-06-02 DIAGNOSIS — E162 Hypoglycemia, unspecified: Secondary | ICD-10-CM | POA: Diagnosis not present

## 2018-06-03 DIAGNOSIS — Z89511 Acquired absence of right leg below knee: Secondary | ICD-10-CM | POA: Diagnosis not present

## 2018-06-03 DIAGNOSIS — T68XXXA Hypothermia, initial encounter: Secondary | ICD-10-CM | POA: Diagnosis not present

## 2018-06-03 DIAGNOSIS — E162 Hypoglycemia, unspecified: Secondary | ICD-10-CM | POA: Diagnosis not present

## 2018-06-03 DIAGNOSIS — E872 Acidosis: Secondary | ICD-10-CM | POA: Diagnosis not present

## 2018-06-04 DIAGNOSIS — T68XXXA Hypothermia, initial encounter: Secondary | ICD-10-CM | POA: Diagnosis not present

## 2018-06-04 DIAGNOSIS — E162 Hypoglycemia, unspecified: Secondary | ICD-10-CM | POA: Diagnosis not present

## 2018-06-04 DIAGNOSIS — Z89511 Acquired absence of right leg below knee: Secondary | ICD-10-CM | POA: Diagnosis not present

## 2018-06-04 DIAGNOSIS — E872 Acidosis: Secondary | ICD-10-CM | POA: Diagnosis not present

## 2018-06-05 DIAGNOSIS — T68XXXA Hypothermia, initial encounter: Secondary | ICD-10-CM | POA: Diagnosis not present

## 2018-06-05 DIAGNOSIS — E162 Hypoglycemia, unspecified: Secondary | ICD-10-CM | POA: Diagnosis not present

## 2018-06-05 DIAGNOSIS — Z89511 Acquired absence of right leg below knee: Secondary | ICD-10-CM | POA: Diagnosis not present

## 2018-06-05 DIAGNOSIS — E872 Acidosis: Secondary | ICD-10-CM | POA: Diagnosis not present

## 2018-06-06 DIAGNOSIS — Z89511 Acquired absence of right leg below knee: Secondary | ICD-10-CM | POA: Diagnosis not present

## 2018-06-06 DIAGNOSIS — E162 Hypoglycemia, unspecified: Secondary | ICD-10-CM | POA: Diagnosis not present

## 2018-06-06 DIAGNOSIS — T68XXXA Hypothermia, initial encounter: Secondary | ICD-10-CM | POA: Diagnosis not present

## 2018-06-06 DIAGNOSIS — E872 Acidosis: Secondary | ICD-10-CM | POA: Diagnosis not present

## 2018-06-07 DIAGNOSIS — T68XXXA Hypothermia, initial encounter: Secondary | ICD-10-CM | POA: Diagnosis not present

## 2018-06-07 DIAGNOSIS — E872 Acidosis: Secondary | ICD-10-CM | POA: Diagnosis not present

## 2018-06-07 DIAGNOSIS — E162 Hypoglycemia, unspecified: Secondary | ICD-10-CM | POA: Diagnosis not present

## 2018-06-07 DIAGNOSIS — Z89511 Acquired absence of right leg below knee: Secondary | ICD-10-CM | POA: Diagnosis not present

## 2018-06-22 DIAGNOSIS — L03115 Cellulitis of right lower limb: Secondary | ICD-10-CM

## 2018-06-22 DIAGNOSIS — R7989 Other specified abnormal findings of blood chemistry: Secondary | ICD-10-CM

## 2018-06-22 DIAGNOSIS — N184 Chronic kidney disease, stage 4 (severe): Secondary | ICD-10-CM

## 2018-06-22 DIAGNOSIS — N179 Acute kidney failure, unspecified: Secondary | ICD-10-CM

## 2018-06-22 DIAGNOSIS — N390 Urinary tract infection, site not specified: Secondary | ICD-10-CM

## 2018-06-22 DIAGNOSIS — R8290 Unspecified abnormal findings in urine: Secondary | ICD-10-CM

## 2018-06-22 DIAGNOSIS — S88111A Complete traumatic amputation at level between knee and ankle, right lower leg, initial encounter: Secondary | ICD-10-CM

## 2018-06-22 DIAGNOSIS — E1159 Type 2 diabetes mellitus with other circulatory complications: Secondary | ICD-10-CM

## 2018-06-22 DIAGNOSIS — T68XXXA Hypothermia, initial encounter: Secondary | ICD-10-CM

## 2018-06-22 DIAGNOSIS — E162 Hypoglycemia, unspecified: Secondary | ICD-10-CM

## 2018-06-22 DIAGNOSIS — Z89511 Acquired absence of right leg below knee: Secondary | ICD-10-CM

## 2018-06-23 DIAGNOSIS — N179 Acute kidney failure, unspecified: Secondary | ICD-10-CM | POA: Diagnosis not present

## 2018-06-23 DIAGNOSIS — R8290 Unspecified abnormal findings in urine: Secondary | ICD-10-CM | POA: Diagnosis not present

## 2018-06-23 DIAGNOSIS — L03115 Cellulitis of right lower limb: Secondary | ICD-10-CM | POA: Diagnosis not present

## 2018-06-23 DIAGNOSIS — S88111A Complete traumatic amputation at level between knee and ankle, right lower leg, initial encounter: Secondary | ICD-10-CM | POA: Diagnosis not present

## 2018-06-24 DIAGNOSIS — N179 Acute kidney failure, unspecified: Secondary | ICD-10-CM | POA: Diagnosis not present

## 2018-06-24 DIAGNOSIS — R8290 Unspecified abnormal findings in urine: Secondary | ICD-10-CM | POA: Diagnosis not present

## 2018-06-24 DIAGNOSIS — L03115 Cellulitis of right lower limb: Secondary | ICD-10-CM | POA: Diagnosis not present

## 2018-06-24 DIAGNOSIS — S88111A Complete traumatic amputation at level between knee and ankle, right lower leg, initial encounter: Secondary | ICD-10-CM | POA: Diagnosis not present

## 2018-06-25 DIAGNOSIS — L03115 Cellulitis of right lower limb: Secondary | ICD-10-CM | POA: Diagnosis not present

## 2018-06-25 DIAGNOSIS — R8290 Unspecified abnormal findings in urine: Secondary | ICD-10-CM | POA: Diagnosis not present

## 2018-06-25 DIAGNOSIS — N179 Acute kidney failure, unspecified: Secondary | ICD-10-CM | POA: Diagnosis not present

## 2018-06-25 DIAGNOSIS — S88111A Complete traumatic amputation at level between knee and ankle, right lower leg, initial encounter: Secondary | ICD-10-CM | POA: Diagnosis not present

## 2018-06-26 DIAGNOSIS — R8290 Unspecified abnormal findings in urine: Secondary | ICD-10-CM | POA: Diagnosis not present

## 2018-06-26 DIAGNOSIS — S88111A Complete traumatic amputation at level between knee and ankle, right lower leg, initial encounter: Secondary | ICD-10-CM | POA: Diagnosis not present

## 2018-06-26 DIAGNOSIS — L03115 Cellulitis of right lower limb: Secondary | ICD-10-CM | POA: Diagnosis not present

## 2018-06-26 DIAGNOSIS — N179 Acute kidney failure, unspecified: Secondary | ICD-10-CM | POA: Diagnosis not present

## 2018-06-27 ENCOUNTER — Telehealth: Payer: Self-pay

## 2018-06-27 ENCOUNTER — Ambulatory Visit: Payer: Medicare Other | Admitting: Endocrinology

## 2018-06-27 NOTE — Telephone Encounter (Signed)
Pt scheduled to be seen today by Dr. Everardo All. Unable to locate records for this referral. [facility], [phone]. Was transferred to Medical Records and LVM requesting records. Will cont to await records

## 2018-06-28 DIAGNOSIS — L03115 Cellulitis of right lower limb: Secondary | ICD-10-CM | POA: Diagnosis not present

## 2018-06-28 DIAGNOSIS — R8290 Unspecified abnormal findings in urine: Secondary | ICD-10-CM | POA: Diagnosis not present

## 2018-06-28 DIAGNOSIS — N179 Acute kidney failure, unspecified: Secondary | ICD-10-CM | POA: Diagnosis not present

## 2018-06-28 DIAGNOSIS — S88111A Complete traumatic amputation at level between knee and ankle, right lower leg, initial encounter: Secondary | ICD-10-CM | POA: Diagnosis not present

## 2018-06-29 DIAGNOSIS — N179 Acute kidney failure, unspecified: Secondary | ICD-10-CM | POA: Diagnosis not present

## 2018-06-29 DIAGNOSIS — S88111A Complete traumatic amputation at level between knee and ankle, right lower leg, initial encounter: Secondary | ICD-10-CM | POA: Diagnosis not present

## 2018-06-29 DIAGNOSIS — R8290 Unspecified abnormal findings in urine: Secondary | ICD-10-CM | POA: Diagnosis not present

## 2018-06-29 DIAGNOSIS — L03115 Cellulitis of right lower limb: Secondary | ICD-10-CM | POA: Diagnosis not present

## 2018-07-05 DIAGNOSIS — S81801A Unspecified open wound, right lower leg, initial encounter: Secondary | ICD-10-CM

## 2018-07-05 DIAGNOSIS — E039 Hypothyroidism, unspecified: Secondary | ICD-10-CM

## 2018-07-05 DIAGNOSIS — G9341 Metabolic encephalopathy: Secondary | ICD-10-CM

## 2018-07-05 DIAGNOSIS — Z940 Kidney transplant status: Secondary | ICD-10-CM

## 2018-07-05 DIAGNOSIS — N184 Chronic kidney disease, stage 4 (severe): Secondary | ICD-10-CM

## 2018-07-05 DIAGNOSIS — R8290 Unspecified abnormal findings in urine: Secondary | ICD-10-CM

## 2018-07-05 DIAGNOSIS — Z89511 Acquired absence of right leg below knee: Secondary | ICD-10-CM

## 2018-07-05 DIAGNOSIS — S88111A Complete traumatic amputation at level between knee and ankle, right lower leg, initial encounter: Secondary | ICD-10-CM

## 2018-07-05 DIAGNOSIS — E162 Hypoglycemia, unspecified: Secondary | ICD-10-CM

## 2018-07-07 DIAGNOSIS — E860 Dehydration: Secondary | ICD-10-CM

## 2018-07-07 DIAGNOSIS — I959 Hypotension, unspecified: Secondary | ICD-10-CM | POA: Diagnosis not present

## 2018-07-07 DIAGNOSIS — Z940 Kidney transplant status: Secondary | ICD-10-CM | POA: Diagnosis not present

## 2018-07-07 DIAGNOSIS — I10 Essential (primary) hypertension: Secondary | ICD-10-CM

## 2018-07-07 DIAGNOSIS — E119 Type 2 diabetes mellitus without complications: Secondary | ICD-10-CM

## 2018-07-12 DIAGNOSIS — N390 Urinary tract infection, site not specified: Secondary | ICD-10-CM

## 2018-07-12 DIAGNOSIS — I959 Hypotension, unspecified: Secondary | ICD-10-CM | POA: Diagnosis not present

## 2018-07-13 DIAGNOSIS — I959 Hypotension, unspecified: Secondary | ICD-10-CM | POA: Diagnosis not present

## 2018-07-13 DIAGNOSIS — N390 Urinary tract infection, site not specified: Secondary | ICD-10-CM | POA: Diagnosis not present

## 2018-07-19 DIAGNOSIS — M869 Osteomyelitis, unspecified: Secondary | ICD-10-CM | POA: Diagnosis not present

## 2018-07-19 DIAGNOSIS — N184 Chronic kidney disease, stage 4 (severe): Secondary | ICD-10-CM

## 2018-07-19 DIAGNOSIS — E1159 Type 2 diabetes mellitus with other circulatory complications: Secondary | ICD-10-CM

## 2018-07-19 DIAGNOSIS — Z940 Kidney transplant status: Secondary | ICD-10-CM

## 2018-07-19 DIAGNOSIS — E11628 Type 2 diabetes mellitus with other skin complications: Secondary | ICD-10-CM

## 2018-07-19 DIAGNOSIS — R8290 Unspecified abnormal findings in urine: Secondary | ICD-10-CM

## 2018-07-20 DIAGNOSIS — E11628 Type 2 diabetes mellitus with other skin complications: Secondary | ICD-10-CM | POA: Diagnosis not present

## 2018-07-20 DIAGNOSIS — N184 Chronic kidney disease, stage 4 (severe): Secondary | ICD-10-CM | POA: Diagnosis not present

## 2018-07-20 DIAGNOSIS — M869 Osteomyelitis, unspecified: Secondary | ICD-10-CM | POA: Diagnosis not present

## 2018-07-20 DIAGNOSIS — R8290 Unspecified abnormal findings in urine: Secondary | ICD-10-CM | POA: Diagnosis not present

## 2018-07-21 DIAGNOSIS — N184 Chronic kidney disease, stage 4 (severe): Secondary | ICD-10-CM | POA: Diagnosis not present

## 2018-07-21 DIAGNOSIS — R8290 Unspecified abnormal findings in urine: Secondary | ICD-10-CM | POA: Diagnosis not present

## 2018-07-21 DIAGNOSIS — M869 Osteomyelitis, unspecified: Secondary | ICD-10-CM | POA: Diagnosis not present

## 2018-07-21 DIAGNOSIS — E11628 Type 2 diabetes mellitus with other skin complications: Secondary | ICD-10-CM | POA: Diagnosis not present

## 2018-07-22 ENCOUNTER — Inpatient Hospital Stay (HOSPITAL_COMMUNITY)
Admission: AD | Admit: 2018-07-22 | Discharge: 2018-08-07 | DRG: 853 | Disposition: E | Payer: Medicare Other | Source: Other Acute Inpatient Hospital | Attending: Internal Medicine | Admitting: Internal Medicine

## 2018-07-22 DIAGNOSIS — D61818 Other pancytopenia: Secondary | ICD-10-CM | POA: Diagnosis not present

## 2018-07-22 DIAGNOSIS — M869 Osteomyelitis, unspecified: Secondary | ICD-10-CM | POA: Diagnosis present

## 2018-07-22 DIAGNOSIS — Y830 Surgical operation with transplant of whole organ as the cause of abnormal reaction of the patient, or of later complication, without mention of misadventure at the time of the procedure: Secondary | ICD-10-CM | POA: Diagnosis not present

## 2018-07-22 DIAGNOSIS — A419 Sepsis, unspecified organism: Secondary | ICD-10-CM | POA: Diagnosis present

## 2018-07-22 DIAGNOSIS — G9341 Metabolic encephalopathy: Secondary | ICD-10-CM | POA: Diagnosis not present

## 2018-07-22 DIAGNOSIS — E119 Type 2 diabetes mellitus without complications: Secondary | ICD-10-CM | POA: Diagnosis not present

## 2018-07-22 DIAGNOSIS — N186 End stage renal disease: Secondary | ICD-10-CM | POA: Diagnosis not present

## 2018-07-22 DIAGNOSIS — T8619 Other complication of kidney transplant: Secondary | ICD-10-CM | POA: Diagnosis present

## 2018-07-22 DIAGNOSIS — L89153 Pressure ulcer of sacral region, stage 3: Secondary | ICD-10-CM | POA: Diagnosis present

## 2018-07-22 DIAGNOSIS — J690 Pneumonitis due to inhalation of food and vomit: Secondary | ICD-10-CM | POA: Diagnosis not present

## 2018-07-22 DIAGNOSIS — Z79899 Other long term (current) drug therapy: Secondary | ICD-10-CM

## 2018-07-22 DIAGNOSIS — E1122 Type 2 diabetes mellitus with diabetic chronic kidney disease: Secondary | ICD-10-CM | POA: Diagnosis not present

## 2018-07-22 DIAGNOSIS — T8189XA Other complications of procedures, not elsewhere classified, initial encounter: Secondary | ICD-10-CM

## 2018-07-22 DIAGNOSIS — X58XXXA Exposure to other specified factors, initial encounter: Secondary | ICD-10-CM | POA: Diagnosis not present

## 2018-07-22 DIAGNOSIS — T8781 Dehiscence of amputation stump: Secondary | ICD-10-CM | POA: Diagnosis present

## 2018-07-22 DIAGNOSIS — Z7189 Other specified counseling: Secondary | ICD-10-CM

## 2018-07-22 DIAGNOSIS — R5383 Other fatigue: Secondary | ICD-10-CM | POA: Diagnosis not present

## 2018-07-22 DIAGNOSIS — S78111A Complete traumatic amputation at level between right hip and knee, initial encounter: Secondary | ICD-10-CM

## 2018-07-22 DIAGNOSIS — Z89412 Acquired absence of left great toe: Secondary | ICD-10-CM

## 2018-07-22 DIAGNOSIS — Z91048 Other nonmedicinal substance allergy status: Secondary | ICD-10-CM | POA: Diagnosis not present

## 2018-07-22 DIAGNOSIS — E43 Unspecified severe protein-calorie malnutrition: Secondary | ICD-10-CM | POA: Diagnosis present

## 2018-07-22 DIAGNOSIS — E872 Acidosis: Secondary | ICD-10-CM | POA: Diagnosis present

## 2018-07-22 DIAGNOSIS — L89152 Pressure ulcer of sacral region, stage 2: Secondary | ICD-10-CM | POA: Diagnosis not present

## 2018-07-22 DIAGNOSIS — R8290 Unspecified abnormal findings in urine: Secondary | ICD-10-CM | POA: Diagnosis not present

## 2018-07-22 DIAGNOSIS — Z9841 Cataract extraction status, right eye: Secondary | ICD-10-CM

## 2018-07-22 DIAGNOSIS — M19022 Primary osteoarthritis, left elbow: Secondary | ICD-10-CM | POA: Diagnosis present

## 2018-07-22 DIAGNOSIS — Z515 Encounter for palliative care: Secondary | ICD-10-CM

## 2018-07-22 DIAGNOSIS — Z940 Kidney transplant status: Secondary | ICD-10-CM | POA: Diagnosis not present

## 2018-07-22 DIAGNOSIS — E1165 Type 2 diabetes mellitus with hyperglycemia: Secondary | ICD-10-CM | POA: Diagnosis present

## 2018-07-22 DIAGNOSIS — M868X6 Other osteomyelitis, lower leg: Secondary | ICD-10-CM | POA: Diagnosis present

## 2018-07-22 DIAGNOSIS — N170 Acute kidney failure with tubular necrosis: Secondary | ICD-10-CM | POA: Diagnosis present

## 2018-07-22 DIAGNOSIS — Z794 Long term (current) use of insulin: Secondary | ICD-10-CM

## 2018-07-22 DIAGNOSIS — D696 Thrombocytopenia, unspecified: Secondary | ICD-10-CM | POA: Diagnosis present

## 2018-07-22 DIAGNOSIS — Z825 Family history of asthma and other chronic lower respiratory diseases: Secondary | ICD-10-CM

## 2018-07-22 DIAGNOSIS — K219 Gastro-esophageal reflux disease without esophagitis: Secondary | ICD-10-CM | POA: Diagnosis present

## 2018-07-22 DIAGNOSIS — Z978 Presence of other specified devices: Secondary | ICD-10-CM | POA: Diagnosis not present

## 2018-07-22 DIAGNOSIS — Z452 Encounter for adjustment and management of vascular access device: Secondary | ICD-10-CM

## 2018-07-22 DIAGNOSIS — Z888 Allergy status to other drugs, medicaments and biological substances status: Secondary | ICD-10-CM | POA: Diagnosis not present

## 2018-07-22 DIAGNOSIS — R197 Diarrhea, unspecified: Secondary | ICD-10-CM | POA: Diagnosis not present

## 2018-07-22 DIAGNOSIS — Z89511 Acquired absence of right leg below knee: Secondary | ICD-10-CM

## 2018-07-22 DIAGNOSIS — E1042 Type 1 diabetes mellitus with diabetic polyneuropathy: Secondary | ICD-10-CM | POA: Diagnosis not present

## 2018-07-22 DIAGNOSIS — Z9851 Tubal ligation status: Secondary | ICD-10-CM

## 2018-07-22 DIAGNOSIS — G92 Toxic encephalopathy: Secondary | ICD-10-CM | POA: Diagnosis present

## 2018-07-22 DIAGNOSIS — Z89611 Acquired absence of right leg above knee: Secondary | ICD-10-CM | POA: Diagnosis not present

## 2018-07-22 DIAGNOSIS — R0902 Hypoxemia: Secondary | ICD-10-CM | POA: Diagnosis not present

## 2018-07-22 DIAGNOSIS — Z6825 Body mass index (BMI) 25.0-25.9, adult: Secondary | ICD-10-CM

## 2018-07-22 DIAGNOSIS — I120 Hypertensive chronic kidney disease with stage 5 chronic kidney disease or end stage renal disease: Secondary | ICD-10-CM | POA: Diagnosis present

## 2018-07-22 DIAGNOSIS — L8990 Pressure ulcer of unspecified site, unspecified stage: Secondary | ICD-10-CM | POA: Diagnosis present

## 2018-07-22 DIAGNOSIS — D631 Anemia in chronic kidney disease: Secondary | ICD-10-CM | POA: Diagnosis present

## 2018-07-22 DIAGNOSIS — Z79890 Hormone replacement therapy (postmenopausal): Secondary | ICD-10-CM

## 2018-07-22 DIAGNOSIS — F319 Bipolar disorder, unspecified: Secondary | ICD-10-CM | POA: Diagnosis present

## 2018-07-22 DIAGNOSIS — Z66 Do not resuscitate: Secondary | ICD-10-CM | POA: Diagnosis not present

## 2018-07-22 DIAGNOSIS — Z87891 Personal history of nicotine dependence: Secondary | ICD-10-CM | POA: Diagnosis not present

## 2018-07-22 DIAGNOSIS — M8610 Other acute osteomyelitis, unspecified site: Secondary | ICD-10-CM | POA: Diagnosis not present

## 2018-07-22 DIAGNOSIS — Y835 Amputation of limb(s) as the cause of abnormal reaction of the patient, or of later complication, without mention of misadventure at the time of the procedure: Secondary | ICD-10-CM | POA: Diagnosis present

## 2018-07-22 DIAGNOSIS — J9601 Acute respiratory failure with hypoxia: Secondary | ICD-10-CM | POA: Diagnosis not present

## 2018-07-22 DIAGNOSIS — E11628 Type 2 diabetes mellitus with other skin complications: Secondary | ICD-10-CM | POA: Diagnosis not present

## 2018-07-22 DIAGNOSIS — E1169 Type 2 diabetes mellitus with other specified complication: Secondary | ICD-10-CM | POA: Diagnosis present

## 2018-07-22 DIAGNOSIS — T17900A Unspecified foreign body in respiratory tract, part unspecified causing asphyxiation, initial encounter: Secondary | ICD-10-CM | POA: Diagnosis not present

## 2018-07-22 DIAGNOSIS — E11649 Type 2 diabetes mellitus with hypoglycemia without coma: Secondary | ICD-10-CM | POA: Diagnosis present

## 2018-07-22 DIAGNOSIS — R4182 Altered mental status, unspecified: Secondary | ICD-10-CM | POA: Diagnosis not present

## 2018-07-22 DIAGNOSIS — E1151 Type 2 diabetes mellitus with diabetic peripheral angiopathy without gangrene: Secondary | ICD-10-CM | POA: Diagnosis present

## 2018-07-22 DIAGNOSIS — T8789 Other complications of amputation stump: Secondary | ICD-10-CM | POA: Diagnosis not present

## 2018-07-22 DIAGNOSIS — Z808 Family history of malignant neoplasm of other organs or systems: Secondary | ICD-10-CM

## 2018-07-22 DIAGNOSIS — Z91040 Latex allergy status: Secondary | ICD-10-CM | POA: Diagnosis not present

## 2018-07-22 DIAGNOSIS — E870 Hyperosmolality and hypernatremia: Secondary | ICD-10-CM | POA: Diagnosis not present

## 2018-07-22 DIAGNOSIS — Z95828 Presence of other vascular implants and grafts: Secondary | ICD-10-CM | POA: Diagnosis not present

## 2018-07-22 DIAGNOSIS — E860 Dehydration: Secondary | ICD-10-CM

## 2018-07-22 DIAGNOSIS — I129 Hypertensive chronic kidney disease with stage 1 through stage 4 chronic kidney disease, or unspecified chronic kidney disease: Secondary | ICD-10-CM | POA: Diagnosis present

## 2018-07-22 DIAGNOSIS — N184 Chronic kidney disease, stage 4 (severe): Secondary | ICD-10-CM | POA: Diagnosis not present

## 2018-07-22 DIAGNOSIS — N183 Chronic kidney disease, stage 3 (moderate): Secondary | ICD-10-CM | POA: Diagnosis not present

## 2018-07-22 DIAGNOSIS — Z961 Presence of intraocular lens: Secondary | ICD-10-CM | POA: Diagnosis present

## 2018-07-22 DIAGNOSIS — Z79891 Long term (current) use of opiate analgesic: Secondary | ICD-10-CM

## 2018-07-22 DIAGNOSIS — Z9842 Cataract extraction status, left eye: Secondary | ICD-10-CM

## 2018-07-22 DIAGNOSIS — E1142 Type 2 diabetes mellitus with diabetic polyneuropathy: Secondary | ICD-10-CM | POA: Diagnosis present

## 2018-07-22 DIAGNOSIS — E039 Hypothyroidism, unspecified: Secondary | ICD-10-CM | POA: Diagnosis present

## 2018-07-22 DIAGNOSIS — N179 Acute kidney failure, unspecified: Secondary | ICD-10-CM | POA: Diagnosis not present

## 2018-07-22 DIAGNOSIS — M19021 Primary osteoarthritis, right elbow: Secondary | ICD-10-CM | POA: Diagnosis present

## 2018-07-22 DIAGNOSIS — Z8619 Personal history of other infectious and parasitic diseases: Secondary | ICD-10-CM

## 2018-07-22 DIAGNOSIS — Z823 Family history of stroke: Secondary | ICD-10-CM

## 2018-07-22 DIAGNOSIS — M86261 Subacute osteomyelitis, right tibia and fibula: Secondary | ICD-10-CM | POA: Diagnosis not present

## 2018-07-22 DIAGNOSIS — Z7952 Long term (current) use of systemic steroids: Secondary | ICD-10-CM

## 2018-07-22 LAB — GLUCOSE, CAPILLARY
GLUCOSE-CAPILLARY: 26 mg/dL — AB (ref 70–99)
Glucose-Capillary: 80 mg/dL (ref 70–99)
Glucose-Capillary: 16 mg/dL — CL (ref 70–99)
Glucose-Capillary: 21 mg/dL — CL (ref 70–99)
Glucose-Capillary: 68 mg/dL — ABNORMAL LOW (ref 70–99)
Glucose-Capillary: 91 mg/dL (ref 70–99)

## 2018-07-22 LAB — CBC
HCT: 33.7 % — ABNORMAL LOW (ref 36.0–46.0)
HEMOGLOBIN: 10.4 g/dL — AB (ref 12.0–15.0)
MCH: 27.4 pg (ref 26.0–34.0)
MCHC: 30.9 g/dL (ref 30.0–36.0)
MCV: 88.9 fL (ref 80.0–100.0)
Platelets: 69 10*3/uL — ABNORMAL LOW (ref 150–400)
RBC: 3.79 MIL/uL — AB (ref 3.87–5.11)
RDW: 18.9 % — ABNORMAL HIGH (ref 11.5–15.5)
WBC: 8.4 10*3/uL (ref 4.0–10.5)
nRBC: 0 % (ref 0.0–0.2)

## 2018-07-22 LAB — RENAL FUNCTION PANEL
ALBUMIN: 1.5 g/dL — AB (ref 3.5–5.0)
ANION GAP: 6 (ref 5–15)
BUN: 38 mg/dL — AB (ref 6–20)
CO2: 18 mmol/L — ABNORMAL LOW (ref 22–32)
Calcium: 8.9 mg/dL (ref 8.9–10.3)
Chloride: 122 mmol/L — ABNORMAL HIGH (ref 98–111)
Creatinine, Ser: 3.31 mg/dL — ABNORMAL HIGH (ref 0.44–1.00)
GFR calc Af Amer: 17 mL/min — ABNORMAL LOW (ref >60)
GFR calc non Af Amer: 15 mL/min — ABNORMAL LOW (ref >60)
GLUCOSE: 60 mg/dL — AB (ref 70–99)
PHOSPHORUS: 3.1 mg/dL (ref 2.5–4.6)
POTASSIUM: 4.4 mmol/L (ref 3.5–5.1)
SODIUM: 146 mmol/L — AB (ref 135–145)

## 2018-07-22 LAB — MRSA PCR SCREENING: MRSA by PCR: NEGATIVE

## 2018-07-22 MED ORDER — DEXTROSE 50 % IV SOLN
INTRAVENOUS | Status: AC
  Administered 2018-07-22: 50 mL
  Filled 2018-07-22: qty 50

## 2018-07-22 MED ORDER — ONDANSETRON HCL 4 MG/2ML IJ SOLN
4.0000 mg | Freq: Four times a day (QID) | INTRAMUSCULAR | Status: DC | PRN
  Administered 2018-07-29: 4 mg via INTRAVENOUS

## 2018-07-22 MED ORDER — INSULIN ASPART 100 UNIT/ML ~~LOC~~ SOLN
0.0000 [IU] | Freq: Three times a day (TID) | SUBCUTANEOUS | Status: DC
  Administered 2018-07-23: 1 [IU] via SUBCUTANEOUS
  Administered 2018-07-23 (×2): 2 [IU] via SUBCUTANEOUS
  Administered 2018-07-24: 3 [IU] via SUBCUTANEOUS
  Administered 2018-07-24: 1 [IU] via SUBCUTANEOUS

## 2018-07-22 MED ORDER — PENTOXIFYLLINE ER 400 MG PO TBCR
400.0000 mg | EXTENDED_RELEASE_TABLET | Freq: Three times a day (TID) | ORAL | Status: DC
  Administered 2018-07-23: 400 mg via ORAL
  Filled 2018-07-22 (×3): qty 1

## 2018-07-22 MED ORDER — INSULIN GLARGINE 100 UNIT/ML ~~LOC~~ SOLN
10.0000 [IU] | Freq: Every day | SUBCUTANEOUS | Status: DC
  Administered 2018-07-24: 10 [IU] via SUBCUTANEOUS
  Filled 2018-07-22 (×3): qty 0.1

## 2018-07-22 MED ORDER — ACETAMINOPHEN 650 MG RE SUPP
650.0000 mg | Freq: Four times a day (QID) | RECTAL | Status: DC | PRN
  Administered 2018-07-30 – 2018-08-01 (×3): 650 mg via RECTAL
  Filled 2018-07-22 (×3): qty 1

## 2018-07-22 MED ORDER — PREDNISONE 10 MG PO TABS
5.0000 mg | ORAL_TABLET | Freq: Every day | ORAL | Status: DC
  Administered 2018-07-23: 5 mg via ORAL
  Filled 2018-07-22: qty 1

## 2018-07-22 MED ORDER — HYDROCODONE-ACETAMINOPHEN 5-325 MG PO TABS
1.0000 | ORAL_TABLET | Freq: Four times a day (QID) | ORAL | Status: DC | PRN
  Administered 2018-07-23 – 2018-07-25 (×4): 1 via ORAL
  Filled 2018-07-22 (×4): qty 1

## 2018-07-22 MED ORDER — NYSTATIN 100000 UNIT/GM EX POWD
Freq: Two times a day (BID) | CUTANEOUS | Status: DC
  Administered 2018-07-22 – 2018-07-31 (×17): via TOPICAL
  Administered 2018-08-01: 2 g via TOPICAL
  Administered 2018-08-01 – 2018-08-02 (×3): via TOPICAL
  Filled 2018-07-22 (×2): qty 15

## 2018-07-22 MED ORDER — SODIUM BICARBONATE 650 MG PO TABS
650.0000 mg | ORAL_TABLET | Freq: Two times a day (BID) | ORAL | Status: DC
  Administered 2018-07-23: 650 mg via ORAL
  Filled 2018-07-22: qty 1

## 2018-07-22 MED ORDER — ACETAMINOPHEN 325 MG PO TABS: 325.0000 mg | ORAL_TABLET | Freq: Four times a day (QID) | ORAL | Status: DC | PRN

## 2018-07-22 MED ORDER — SODIUM CHLORIDE 0.9 % IV SOLN
2.0000 g | INTRAVENOUS | Status: DC
  Administered 2018-07-22 – 2018-08-01 (×11): 2 g via INTRAVENOUS
  Filled 2018-07-22 (×11): qty 20

## 2018-07-22 MED ORDER — SODIUM CHLORIDE 0.9 % IV SOLN
300.0000 mg | INTRAVENOUS | Status: DC
  Administered 2018-07-22 – 2018-07-24 (×2): 300 mg via INTRAVENOUS
  Filled 2018-07-22 (×2): qty 6

## 2018-07-22 MED ORDER — SODIUM CHLORIDE 0.9 % IV SOLN
INTRAVENOUS | Status: DC | PRN
  Administered 2018-07-22: 1000 mL via INTRAVENOUS

## 2018-07-22 MED ORDER — QUETIAPINE FUMARATE 25 MG PO TABS
25.0000 mg | ORAL_TABLET | Freq: Two times a day (BID) | ORAL | Status: DC | PRN
  Administered 2018-07-23: 25 mg via ORAL
  Filled 2018-07-22: qty 1

## 2018-07-22 MED ORDER — HYDRALAZINE HCL 20 MG/ML IJ SOLN
10.0000 mg | Freq: Four times a day (QID) | INTRAMUSCULAR | Status: DC | PRN
  Administered 2018-07-22 – 2018-07-24 (×5): 10 mg via INTRAVENOUS
  Filled 2018-07-22 (×5): qty 1

## 2018-07-22 MED ORDER — SODIUM CHLORIDE 0.9 % IV SOLN
INTRAVENOUS | Status: DC
  Administered 2018-07-22: 18:00:00 via INTRAVENOUS

## 2018-07-22 MED ORDER — ACETAMINOPHEN 325 MG PO TABS
650.0000 mg | ORAL_TABLET | Freq: Four times a day (QID) | ORAL | Status: DC | PRN
  Administered 2018-07-28 – 2018-07-30 (×4): 650 mg via ORAL
  Filled 2018-07-22 (×4): qty 2

## 2018-07-22 MED ORDER — TACROLIMUS 1 MG PO CAPS
1.0000 mg | ORAL_CAPSULE | Freq: Every day | ORAL | Status: DC
  Administered 2018-07-23 – 2018-07-28 (×5): 1 mg via ORAL
  Filled 2018-07-22 (×8): qty 1

## 2018-07-22 MED ORDER — STERILE WATER FOR INJECTION IV SOLN
INTRAVENOUS | Status: DC
  Administered 2018-07-22 – 2018-07-23 (×2): via INTRAVENOUS
  Filled 2018-07-22 (×3): qty 850

## 2018-07-22 MED ORDER — DEXTROSE-NACL 5-0.9 % IV SOLN
INTRAVENOUS | Status: DC
  Administered 2018-07-22 – 2018-07-23 (×2): via INTRAVENOUS

## 2018-07-22 MED ORDER — POLYETHYLENE GLYCOL 3350 17 G PO PACK: 17.0000 g | PACK | Freq: Every day | ORAL | Status: DC | PRN

## 2018-07-22 MED ORDER — LABETALOL HCL 5 MG/ML IV SOLN
10.0000 mg | Freq: Once | INTRAVENOUS | Status: AC
  Administered 2018-07-23: 10 mg via INTRAVENOUS
  Filled 2018-07-22: qty 4

## 2018-07-22 MED ORDER — OXYCODONE HCL 5 MG PO TABS: 5.0000 mg | ORAL_TABLET | Freq: Four times a day (QID) | ORAL | Status: DC | PRN

## 2018-07-22 MED ORDER — ONDANSETRON HCL 4 MG PO TABS: 4.0000 mg | ORAL_TABLET | Freq: Four times a day (QID) | ORAL | Status: DC | PRN

## 2018-07-22 NOTE — Consult Note (Signed)
Reason for Consult:Right tibial osteo Referring Physician: Roger Shelter  [name] is an 52 y.o. female.  HPI: [name] was transferred from [facility] with AKI. She had been receiving treatment there for sepsis and DKA since 11/12. She has been suffering with a chronic stump wound and the plan was to convert her BKA to AKA once she was stable. MRI on 11/14 with probably osteo of the distal tibial stump. Once she showed signs of AKI she was transferred to [facility] for nephrology care. She is quite lethargic here and unable to contribute meaningfully to history which was taken from the materials sent with her from [place].  Past Medical History:  Diagnosis Date  . Arthritis    "elbows, knees, legs, back" (09/24/2015)  . CKD (chronic kidney disease)   . Daily headache   . Depression    "years ago"  . End stage renal disease (HCC)    right arm AV graft, resolved post transplant  . Hypertension   . Hypothyroid   . 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 Kidney disease 2011  . Pneumonia ~ 2007?  [name] Type II diabetes mellitus (HCC)    Insulin dependant    Past Surgical History:  Procedure Laterality Date  . ABDOMINAL AORTOGRAM W/LOWER EXTREMITY N/A 01/10/2018   Procedure: ABDOMINAL AORTOGRAM W/LOWER EXTREMITY;  Surgeon: Maeola Harman, MD;  Location: [facility] INVASIVE CV LAB;  Service: Cardiovascular;  Laterality: N/A;  . AMPUTATION Left 05/11/2014   Procedure: AMPUTATION LEFT GREAT TOE;  Surgeon: Kathryne Hitch, MD;  Location: [facility] ORS;  Service: Orthopedics;  Laterality: Left;  . AV FISTULA PLACEMENT Right    forearm  . BACK SURGERY    . CATARACT EXTRACTION W/ INTRAOCULAR LENS  IMPLANT, BILATERAL Bilateral   . CESAREAN SECTION  07/1999  . DG AV DIALYSIS GRAFT DECLOT OR    . HIP PINNING,CANNULATED Right 06/02/2017   Procedure: CANNULATED RIGHT HIP PINNING;  Surgeon: Beverely Low, MD;  Location: [facility] ORS;  Service: Orthopedics;  Laterality: Right;  .  INCISION AND DRAINAGE Right 06/02/2015   Procedure: INCISION AND DRAINAGE OF RIGHT 3rd RAY RESECTION;  Surgeon: Kathryne Hitch, MD;  Location: [facility] OR;  Service: Orthopedics;  Laterality: Right;  . 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 LUMBAR DISC SURGERY  2001  . NEPHRECTOMY TRANSPLANTED ORGAN    . TOE AMPUTATION Right    1,2 & 3rd toes.  . TUBAL LIGATION  07/1999    Family History  Problem Relation Age of Onset  . Emphysema Mother   . Throat cancer Mother   . COPD Mother   . Cancer Mother   . Emphysema Father   . COPD Father   . Stroke Father   . ADD / ADHD Son     Social History:  reports that she quit smoking about 19 years ago. Her smoking use included cigarettes. She has a 11.00 pack-year smoking history. She has never used smokeless tobacco. She reports that she does not drink alcohol or use drugs.  Allergies:  Allergies  Allergen Reactions  . Latex Itching  . Ace Inhibitors Cough  . Adhesive [Tape] Itching and Other (See Comments)    Please use paper tape  . Lisinopril Cough    Medications: I have reviewed the patient's current medications.  No results found for this or any previous visit (from the past 48 hour(s)).  No results found.  Review of Systems  Unable to perform ROS: Mental acuity  Last menstrual period 11/19/2011. Physical Exam  Constitutional: She appears well-developed and well-nourished. No distress.  HENT:  Head: Normocephalic and atraumatic.  Eyes: Conjunctivae are normal. Right eye exhibits no discharge. Left eye exhibits no discharge. No scleral icterus.  Neck: Normal range of motion.  Cardiovascular: Normal rate and regular rhythm.  Respiratory: Effort normal. No respiratory distress.  Musculoskeletal:  RLE No traumatic wounds, ecchymosis, or rash  S/p BKA, large distal grayish wound, soupy, mild odor, mild TTP  No knee or ankle effusion  Knee stable to varus/ valgus and anterior/posterior stress  Sens paresthetic   Neurological: She is alert.  Skin: Skin is warm and dry. She is not diaphoretic.  Psychiatric: She has a normal mood and affect. Her behavior is normal.    Assessment/Plan: Right tibial stump osteomyelitis -- Dr. Lajoyce Cornersuda to evaluate. Likely plan AKA vs BKA revision once she is stable from a medical standpoint, likely sometime next week. Dry dressings to BKA stump q8h. Multiple medical problems including DM, malnutrition, s/p renal transplant with AKI, CKD, chronic anemia, and, recently, delirium and sepsis -- per primary team    Freeman CaldronMichael J. Burdett Pinzon, PA-C Orthopedic Surgery [phone] 07/16/2018, 2:28 PM

## 2018-07-22 NOTE — Consult Note (Signed)
WOC Nurse wound consult note [facility] [phone].  No family present.  According to her primary RN, Dr. Audrie Liauda's PA just evaluated the patient and the plan is for surgery next week on Monday. Reason for Consult: Right stump necrotic wound, failed improvement with hyperbaric therapy.  Previously documented to have osteomyelitis. Plan of care:  Pack gauze into the right stump wound. Cover with ABD pad. Tape in place. Change daily.  I have elected to use dry gauze so as not to increase the moisture to the wound and contribute to microbial growth. Thank you for the consult.  Discussed plan of care with the patient and bedside nurse.  WOC nurse will not follow at this time.  Please re-consult the WOC team if needed.  Helmut MusterSherry Vicente Weidler, RN, MSN, CWOCN, CNS-BC, pager [phone]

## 2018-07-22 NOTE — Progress Notes (Signed)
Hypoglycemic Event  CBG: 26  Treatment: 1 amp D50  Symptoms: Unknown  Follow-up CBG Result: 83  Possible Reasons for Event: NPO  Comments/MD notified: Bodenheimer, [name]    [name]

## 2018-07-22 NOTE — Consult Note (Signed)
Reason for Consult:AKI/CKD stage 3 Referring Physician:  Hal Hope, MD  [name] is an 52 y.o. female.  HPI: Mrs. Kirks is a 52yo WF with PMH significant for longstanding, poorly controlled DM, HTN, PVD s/p RBKA, medical noncompliance, and ESRD due to DM s/p DDKT on 12/16/09 at [facility] who was admitted to [facility] on 07/19/18 from her SNF with AMS.  She was again found to have sepsis with DKA and treated with IVFs, insulin, and IV antibiotics.  She again developed AKI/CKD and was transferred to [facility] for further evaluation and management.  Ortho had been planning on revising her BKA to an AKA due to nonhealing ulcer and evidence of osteomyelitis.  We were consulted to help further evaluate and manage her AKI/CKD.  The trend in Scr is seen below.  She is currently very somnolent and oriented only to person.  Labs 07/09/2018 from the [facility] were notable for Na 140, K 4.7, Cl 116, CO2 17, BUN 40, Cr 3.1, gluc 252, WBC 4.8, Hgb 6.5, plt 59  Of note, she has had multiple admissions with DKA associated with AKI/CKD as well as nonhealing RBKA (05/31/18) over the past 6 months (most recent was at the end of October).     Trend in Creatinine:  Creatinine, Ser  Date/Time Value Ref Range Status  08/06/2018 3.1 (H)    07/20/2018 3.3 (H)    07/19/2018 2.9 (H)    01/11/2018 06:06 AM 2.38 (H) 0.44 - 1.00 mg/dL Final  96/12/5407 81:19 AM 2.44 (H) 0.44 - 1.00 mg/dL Final  14/78/2956 21:30 AM 2.65 (H) 0.44 - 1.00 mg/dL Final  86/57/8469 62:95 AM 2.77 (H) 0.44 - 1.00 mg/dL Final  28/41/3244 01:02 AM 2.86 (H) 0.44 - 1.00 mg/dL Final  72/53/6644 03:47 AM 2.79 (H) 0.44 - 1.00 mg/dL Final  42/59/5638 75:64 AM 2.60 (H) 0.44 - 1.00 mg/dL Final  33/29/5188 41:66 PM 2.27 (H) 0.44 - 1.00 mg/dL Final  03/06/1600 09:32 PM 2.41 (H) 0.44 - 1.00 mg/dL Final  35/57/3220 25:42 PM 1.80 (H) 0.44 - 1.00 mg/dL Final  70/62/3762 83:15 PM 1.72 (H) 0.44 - 1.00 mg/dL Final  17/61/6073 71:06 AM 1.68 (H) 0.44 - 1.00  mg/dL Final  26/94/8546 27:03 AM 1.66 (H) 0.44 - 1.00 mg/dL Final  50/05/3817 29:93 AM 1.85 (H) 0.44 - 1.00 mg/dL Final  71/69/6789 38:10 AM 2.02 (H) 0.44 - 1.00 mg/dL Final  17/51/0258 52:77 PM 2.29 (H) 0.44 - 1.00 mg/dL Final  82/42/3536 14:43 AM 2.42 (H) 0.44 - 1.00 mg/dL Final  15/40/0867 61:95 AM 2.32 (H) 0.44 - 1.00 mg/dL Final  09/32/6712 45:80 AM 2.10 (H) 0.44 - 1.00 mg/dL Final  99/83/3825 05:39 AM 1.93 (H) 0.44 - 1.00 mg/dL Final  76/73/4193 79:02 AM 2.15 (H) 0.44 - 1.00 mg/dL Final  40/97/3532 99:24 AM 2.14 (H) 0.44 - 1.00 mg/dL Final  26/83/4196 22:29 AM 2.12 (H) 0.44 - 1.00 mg/dL Final  79/89/2119 41:74 AM 2.14 (H) 0.44 - 1.00 mg/dL Final  04/20/4817 56:31 AM 2.42 (H) 0.44 - 1.00 mg/dL Final  49/70/2637 85:88 AM 2.90 (H) 0.44 - 1.00 mg/dL Final  50/27/7412 87:86 PM 2.63 (H) 0.44 - 1.00 mg/dL Final  76/72/0947 09:62 AM 1.55 (H) 0.44 - 1.00 mg/dL Final  83/66/2947 65:46 AM 1.53 (H) 0.44 - 1.00 mg/dL Final  50/35/4656 81:27 AM 2.12 (H) 0.44 - 1.00 mg/dL Final  51/70/0174 94:49 PM 2.59 (H) 0.44 - 1.00 mg/dL Final  67/59/1638 46:65 AM 2.83 (H) 0.44 - 1.00 mg/dL Final  99/35/7017  11:34 PM 3.04 (H) 0.44 - 1.00 mg/dL Final  16/10/960412/25/2018 54:0908:04 PM 3.23 (H) 0.44 - 1.00 mg/dL Final  81/19/147812/25/2018 29:5604:10 PM 2.50 (H) 0.44 - 1.00 mg/dL Final  21/30/865712/25/2018 84:6903:53 PM 3.36 (H) 0.44 - 1.00 mg/dL Final  62/95/284109/27/2018 32:4406:43 AM 1.13 (H) 0.44 - 1.00 mg/dL Final  01/02/725309/26/2018 66:4401:24 PM 1.19 (H) 0.44 - 1.00 mg/dL Final  03/47/425909/21/2018 56:3802:29 AM 1.54 (H) 0.44 - 1.00 mg/dL Final  75/64/332909/11/2016 51:8802:44 AM 1.39 (H) 0.44 - 1.00 mg/dL Final  41/66/063009/10/2016 16:0102:44 PM 1.39 (H) 0.44 - 1.00 mg/dL Final  09/32/355709/10/2016 32:2008:53 AM 1.44 (H) 0.44 - 1.00 mg/dL Final  25/42/706208/23/2018 37:6203:30 AM 1.30 (H) 0.44 - 1.00 mg/dL Final  83/15/176108/22/2018 60:7312:37 AM 1.66 (H) 0.44 - 1.00 mg/dL Final  71/06/269408/21/2018 85:4608:31 PM 1.64 (H) 0.44 - 1.00 mg/dL Final  27/03/500908/21/2018 38:1804:35 PM 1.73 (H) 0.44 - 1.00 mg/dL Final  29/93/716908/21/2018 67:8912:46 PM 1.74 (H) 0.44 - 1.00 mg/dL Final  38/10/175108/21/2018  02:5808:30 AM 1.73 (H) 0.44 - 1.00 mg/dL Final  52/77/824208/21/2018 35:3604:59 AM 1.70 (H) 0.44 - 1.00 mg/dL Final  14/43/154008/20/2018 08:6709:38 PM 1.86 (H) 0.44 - 1.00 mg/dL Final  61/95/093208/20/2018 67:1205:49 PM 1.77 (H) 0.44 - 1.00 mg/dL Final  45/80/998308/20/2018 38:2502:56 PM 1.90 (H) 0.44 - 1.00 mg/dL Final    PMH:   Past Medical History:  Diagnosis Date  . Arthritis    "elbows, knees, legs, back" (09/24/2015)  . CKD (chronic kidney disease)   . Daily headache   . Depression    "years ago"  . End stage renal disease (HCC)    right arm AV graft, resolved post transplant  . Hypertension   . Hypothyroid   . 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 Kidney disease 2011  . Pneumonia ~ 2007?  [name]. Type II diabetes mellitus (HCC)    Insulin dependant    PSH:   Past Surgical History:  Procedure Laterality Date  . ABDOMINAL AORTOGRAM W/LOWER EXTREMITY N/A 01/10/2018   Procedure: ABDOMINAL AORTOGRAM W/LOWER EXTREMITY;  Surgeon: Maeola Harmanain, Brandon Christopher, MD;  [facility];  Service: Cardiovascular;  Laterality: N/A;  . AMPUTATION Left 05/11/2014   Procedure: AMPUTATION LEFT GREAT TOE;  Surgeon: Kathryne Hitchhristopher Y Blackman, MD;  Location: [facility] ORS;  Service: Orthopedics;  Laterality: Left;  . AV FISTULA PLACEMENT Right    forearm  . BACK SURGERY    . CATARACT EXTRACTION W/ INTRAOCULAR LENS  IMPLANT, BILATERAL Bilateral   . CESAREAN SECTION  07/1999  . DG AV DIALYSIS GRAFT DECLOT OR    . HIP PINNING,CANNULATED Right 06/02/2017   Procedure: CANNULATED RIGHT HIP PINNING;  Surgeon: Beverely LowNorris, Steve, MD;  Location: [facility] ORS;  Service: Orthopedics;  Laterality: Right;  . INCISION AND DRAINAGE Right 06/02/2015   Procedure: INCISION AND DRAINAGE OF RIGHT 3rd RAY RESECTION;  Surgeon: Kathryne Hitchhristopher Y Blackman, MD;  Location: [facility] OR;  Service: Orthopedics;  Laterality: Right;  . 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 LUMBAR DISC SURGERY  2001  . NEPHRECTOMY TRANSPLANTED ORGAN    . TOE AMPUTATION Right    1,2 & 3rd toes.  .  TUBAL LIGATION  07/1999    Allergies:  Allergies  Allergen Reactions  . Latex Itching  . Ace Inhibitors Cough  . Adhesive [Tape] Itching and Other (See Comments)    Please use paper tape  . Lisinopril Cough    Medications:   Prior to Admission medications   Medication Sig Start Date End Date Taking? Authorizing Provider  acetaminophen (TYLENOL) 325 MG tablet Take 1 tablet (325 mg total) by mouth every 6 (six) hours as needed for mild pain, moderate pain or headache. Patient not taking: Reported on 07/20/2018 11/12/17   Albertine Grates, MD  Amino Acids-Protein Hydrolys (FEEDING SUPPLEMENT, PRO-STAT SUGAR FREE 64,) LIQD Take 30 mLs by mouth 2 (two) times daily.    [provider]  amLODipine (NORVASC) 5 MG tablet Take 1 tablet (5 mg total) by mouth daily. Patient not taking: Reported on 08/06/2018 11/20/17   Burnadette Pop, MD  Ascorbic Acid (VITAMIN C PO) Take 1 capsule by mouth 2 (two) times daily.    [provider]  carbamide peroxide (DEBROX) 6.5 % OTIC solution 10 drops 2 (two) times daily.    [provider]  carvedilol (COREG) 25 MG tablet Take 1 tablet (25 mg total) by mouth 2 (two) times daily with a meal. 11/12/17   Albertine Grates, MD  Cholecalciferol (VITAMIN D) 50 MCG (2000 UT) tablet Take 2,000 Units by mouth daily.    [provider]  collagenase (SANTYL) ointment Apply topically daily. 01/12/18   Zannie Cove, MD  doxycycline (VIBRA-TABS) 100 MG tablet Take 1 tablet (100 mg total) by mouth every 12 (twelve) hours. For 7days Patient not taking: Reported on 07/09/2018 01/11/18   Zannie Cove, MD  DULoxetine (CYMBALTA) 30 MG capsule Take 30 mg by mouth daily.    [provider]  Furosemide (LASIX PO) Take 1 tablet by mouth daily.    [provider]  Glucagon, rDNA, (GLUCAGON EMERGENCY IJ) Inject 1 Dose as directed as directed.    [provider]  HYDROcodone-acetaminophen (NORCO/VICODIN) 5-325 MG tablet Take 1 tablet by mouth  every 6 (six) hours as needed for moderate pain. Patient not taking: Reported on 07/21/2018 01/01/18   Bethann Berkshire, MD  insulin aspart (NOVOLOG) 100 UNIT/ML injection Inject into the skin as directed.    [provider]  insulin detemir (LEVEMIR) 100 UNIT/ML injection Inject 8 Units into the skin daily.    [provider]  insulin glargine (LANTUS) 100 UNIT/ML injection Inject 10 Units into the skin daily.    [provider]  LINEZOLID PO Take 1 tablet by mouth every 12 (twelve) hours.    [provider]  lip balm (BLISTEX) OINT Apply 1 application topically as needed for lip care. 11/12/17   Albertine Grates, MD  mirtazapine (REMERON) 7.5 MG tablet Take 7.5 mg by mouth at bedtime.    [provider]  Multiple Vitamins-Minerals (MULTIVITAMIN WITH MINERALS) tablet Take 1 tablet by mouth daily.    [provider]  mycophenolate (CELLCEPT) 250 MG capsule Take 500 mg by mouth 2 (two) times daily.     [provider]  OXYCODONE HCL PO Take 1 tablet by mouth every 4 (four) hours as needed (moderate/severe pain).    [provider]  pentoxifylline (TRENTAL) 400 MG CR tablet Take 1 tablet (400 mg total) by mouth 3 (three) times daily with meals. 01/11/18   Zannie Cove, MD  predniSONE (DELTASONE) 5 MG tablet Take 5 mg by mouth daily.  05/29/15   [provider]  senna-docusate (SENOKOT-S) 8.6-50 MG tablet Take 1 tablet by mouth at bedtime. Patient not taking: Reported on 08/03/2018 11/12/17   Albertine Grates, MD  sodium bicarbonate 650 MG tablet Take 2 tablets (1,300 mg total) by mouth 2 (two) times daily. 09/04/17   Penny Pia, MD  SYNTHROID 137 MCG tablet Take 137 mcg by mouth daily. 04/10/17  [provider]  tacrolimus (PROGRAF) 1 MG capsule Take 1 mg by mouth every morning.  11/20/17   [provider]    Inpatient medications: . insulin aspart  0-9 Units Subcutaneous TID WC  . insulin glargine  10 Units Subcutaneous  QHS  . nystatin   Topical BID  . pentoxifylline  400 mg Oral TID WC  . [START ON 07/23/2018] predniSONE  5 mg Oral Q breakfast  . sodium bicarbonate  650 mg Oral BID  . [START ON 07/23/2018] tacrolimus  1 mg Oral QPC breakfast    Discontinued Meds:   Medications Discontinued During This Encounter  Medication Reason  . acetaminophen (TYLENOL) tablet 325 mg   . Insulin Isophane & Regular Human (NOVOLIN 70/30 FLEXPEN RELION) (70-30) 100 UNIT/ML PEN Change in therapy  . tacrolimus (PROGRAF) 0.5 MG capsule Discontinued by provider  . ARIPiprazole (ABILIFY PO) Inpatient Standard    Social History:  reports that she quit smoking about 19 years ago. Her smoking use included cigarettes. She has a 11.00 pack-year smoking history. She has never used smokeless tobacco. She reports that she does not drink alcohol or use drugs.  Family History:   Family History  Problem Relation Age of Onset  . Emphysema Mother   . Throat cancer Mother   . COPD Mother   . Cancer Mother   . Emphysema Father   . COPD Father   . Stroke Father   . ADD / ADHD Son     Review of systems not obtained due to patient factors. Weight change:  No intake or output data in the 24 hours ending 08/14/18 1654 BP (!) 177/85   Pulse 95   Temp (!) 97 F (36.1 C) (Oral)   Resp 19   LMP 11/19/2011 Comment: pt had tubal ligation  SpO2 98%  Vitals:   08-14-2018 1500 08-14-18 1600 2018-08-14 1651  BP:  (!) 177/85   Pulse: 95    Resp: 20 19   Temp:   (!) 97 F (36.1 C)  TempSrc:   Oral  SpO2: 98%       General appearance: lethargic and minimally arousable, chronically ill-appearing Head: Normocephalic, without obvious abnormality, atraumatic Resp: clear to auscultation bilaterally Cardio: regular rate and rhythm, S1, S2 normal, no murmur, click, rub or gallop GI: soft, non-tender; bowel sounds normal; no masses,  no organomegaly and renal allograft in RLQ, nontender, no bruits Extremities: no edema, s/p RBKA with open  ucleration and purulent drainage at end of stump  Labs: Basic Metabolic Panel: No results for input(s): NA, K, CL, CO2, GLUCOSE, BUN, CREATININE, ALBUMIN, CALCIUM, PHOS in the last 168 hours. Liver Function Tests: No results for input(s): AST, ALT, ALKPHOS, BILITOT, PROT, ALBUMIN in the last 168 hours. No results for input(s): LIPASE, AMYLASE in the last 168 hours. No results for input(s): AMMONIA in the last 168 hours. CBC: No results for input(s): WBC, NEUTROABS, HGB, HCT, MCV, PLT in the last 168 hours. PT/INR: @LABRCNTIP (inr:5) Cardiac Enzymes: )No results for input(s): CKTOTAL, CKMB, CKMBINDEX, TROPONINI in the last 168 hours. CBG: Recent Labs  Lab 08-14-2018 1644  GLUCAP 21*    Iron Studies: No results for input(s): IRON, TIBC, TRANSFERRIN, FERRITIN in the last 168 hours.  Xrays/Other Studies: No results found.   Assessment/Plan: 1.  AKI/CKD stage 3- in setting of DKA.  Off of insulin drip.  Appears to be volume depleted.  Will start IVF's with isotonic bicarb due to metabolic acidosis.  Will repeat labs and continue  to folllow. 2. Metabolic acidosis/DKA- off insulin, will start bicarb and follow 3. Pancytopenia- possibly related to sepsis but also on DDx is drug related as she has been on daptomycin and rocephin.  She received 2 units of PRBC's prior to transfer. 4. Osteomyelitis of R BKA stump- plan for revision to AKA when stable 5. Anemia- has some anemia of CKD stage 3 as well as critical illness.  Transfuse and follow. 6. S/p DDKT- cont with prograf and prednisone.  Will check prograf level and adjust dosing. 7. HTN- continue with carvedilol but hold lasix.  Could also add amlodipine 10mg  daily if remains elevated. 8. Severe protein malnutrition- per primary 9. Sepsis- abx per primary svc and Ortho   Julien Nordmann Kin Galbraith 07/21/2018, 4:54 PM

## 2018-07-22 NOTE — H&P (Addendum)
History and Physical    DOA: August 03, 2018  PCP: Dois Davenport, MD  Patient coming from: [facility]  Chief Complaint: Admitted to [facility] with altered mental status, transferred here for wound infection/orthopedic and ID consultations  HPI: [name] is a 52 y.o. 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 Georgiana Shore complicated by delayed healing and wound infection requiring debridement on June 27, 2018 followed by wound VAC placement and removal, severe peripheral vascular disease presented to [facility] from skilled nursing facility on July 19, 2018 with altered mental status/hallucinations.  Patient has a history of brittle diabetes with hypoglycemia/hyperglycemia episodes in the past as well as hypokalemia.  Her hospital course at [facility] was complicated by DKA and worsening mental status requiring ICU stay for IV fluids/insulin drip and also started on IV antibiotics and concern for stump infection.  She received 2 doses of vancomycin in the initial hospital course but subsequently transitioned to Rocephin and daptomycin and today's day 3.  Her blood cultures have been negative at [place].  She underwent MRI of right lower extremities with findings indicating osteomyelitis.  Her renal function has also been worsening.  Given her immunocompromised state, stump infection and worsening renal function, case was discussed by hospitalist at [facility] with orthopedics, infectious diseases and nephrology here.  Patient transferred for further evaluation by consultants and possible AKA.  She is currently off insulin drip and was transitioned to Lantus prior to transfer here.  Per outside records she had abnormal UA and does have a history of ESBL E. coli  previously but unclear if urine cultures were positive.  Patient currently disoriented but able to tell me her name, year and that she is in a hospital.  She could not recollect a month or name the hospital.  During the conversation she appeared to be mumbling to herself and making irrelevant statements.  When asked if she is in any discomfort, she states she is hurting in her right leg.  She denies any nausea, headache or chest/abdominal pain.   Review of Systems: As per HPI otherwise 10 point review of systems negative.    Past Medical History:  Diagnosis Date  . Arthritis    "elbows, knees, legs, back" (09/24/2015)  . CKD (chronic kidney disease)   . Daily headache   . Depression    "years ago"  . End stage renal disease (HCC)    right arm AV graft, resolved post transplant  . Hypertension   . Hypothyroid   . 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 Kidney disease 2011  . Pneumonia ~ 2007?  [name] Type II diabetes mellitus (HCC)    Insulin dependant    Past Surgical History:  Procedure Laterality Date  . ABDOMINAL AORTOGRAM W/LOWER EXTREMITY N/A 01/10/2018   Procedure: ABDOMINAL AORTOGRAM W/LOWER EXTREMITY;  Surgeon: Maeola Harman, MD;  Location: [facility] INVASIVE CV LAB;  Service: Cardiovascular;  Laterality: N/A;  . AMPUTATION Left 05/11/2014   Procedure: AMPUTATION LEFT GREAT TOE;  Surgeon: Kathryne Hitch, MD;  Location: [facility] ORS;  Service: Orthopedics;  Laterality: Left;  . AV FISTULA PLACEMENT Right    forearm  . BACK SURGERY    . CATARACT EXTRACTION W/ INTRAOCULAR LENS  IMPLANT, BILATERAL Bilateral   . CESAREAN SECTION  07/1999  . DG AV DIALYSIS GRAFT DECLOT OR    .  HIP PINNING,CANNULATED Right 06/02/2017   Procedure: CANNULATED RIGHT HIP PINNING;  Surgeon: Beverely Low, MD;  Location: [facility] ORS;  Service: Orthopedics;  Laterality: Right;  . INCISION AND DRAINAGE Right 06/02/2015   Procedure: INCISION AND DRAINAGE OF RIGHT 3rd RAY RESECTION;   Surgeon: Kathryne Hitch, MD;  Location: [facility] OR;  Service: Orthopedics;  Laterality: Right;  . 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 LUMBAR DISC SURGERY  2001  . NEPHRECTOMY TRANSPLANTED ORGAN    . TOE AMPUTATION Right    1,2 & 3rd toes.  . TUBAL LIGATION  07/1999    Social history:  reports that she quit smoking about 19 years ago. Her smoking use included cigarettes. She has a 11.00 pack-year smoking history. She has never used smokeless tobacco. She reports that she does not drink alcohol or use drugs.   Allergies  Allergen Reactions  . Latex Itching  . Ace Inhibitors Cough  . Adhesive [Tape] Itching and Other (See Comments)    Please use paper tape  . Lisinopril Cough    Family History  Problem Relation Age of Onset  . Emphysema Mother   . Throat cancer Mother   . COPD Mother   . Cancer Mother   . Emphysema Father   . COPD Father   . Stroke Father   . ADD / ADHD Son       Prior to Admission medications   Medication Sig Start Date End Date Taking? Authorizing Provider  acetaminophen (TYLENOL) 325 MG tablet Take 1 tablet (325 mg total) by mouth every 6 (six) hours as needed for mild pain, moderate pain or headache. 11/12/17   Albertine Grates, MD  amLODipine (NORVASC) 5 MG tablet Take 1 tablet (5 mg total) by mouth daily. 11/20/17   Burnadette Pop, MD  carvedilol (COREG) 25 MG tablet Take 1 tablet (25 mg total) by mouth 2 (two) times daily with a meal. 11/12/17   Albertine Grates, MD  collagenase (SANTYL) ointment Apply topically daily. 01/12/18   Zannie Cove, MD  doxycycline (VIBRA-TABS) 100 MG tablet Take 1 tablet (100 mg total) by mouth every 12 (twelve) hours. For 7days 01/11/18   Zannie Cove, MD  HYDROcodone-acetaminophen (NORCO/VICODIN) 5-325 MG tablet Take 1 tablet by mouth every 6 (six) hours as needed for moderate pain. 01/01/18   Bethann Berkshire, MD  Insulin Isophane & Regular Human (NOVOLIN 70/30 FLEXPEN RELION) (70-30) 100 UNIT/ML PEN Inject 20 Units into the skin 2  (two) times daily before a meal. PER SLIDING SCALE    [provider]  lip balm (BLISTEX) OINT Apply 1 application topically as needed for lip care. 11/12/17   Albertine Grates, MD  mycophenolate (CELLCEPT) 250 MG capsule Take 500 mg by mouth 2 (two) times daily.     [provider]  pentoxifylline (TRENTAL) 400 MG CR tablet Take 1 tablet (400 mg total) by mouth 3 (three) times daily with meals. 01/11/18   Zannie Cove, MD  predniSONE (DELTASONE) 5 MG tablet Take 5 mg by mouth daily.  05/29/15   [provider]  senna-docusate (SENOKOT-S) 8.6-50 MG tablet Take 1 tablet by mouth at bedtime. 11/12/17   Albertine Grates, MD  sodium bicarbonate 650 MG tablet Take 2 tablets (1,300 mg total) by mouth 2 (two) times daily. 09/04/17   Penny Pia, MD  SYNTHROID 137 MCG tablet Take 137 mcg by mouth daily. 04/10/17   [provider]  tacrolimus (PROGRAF) 0.5 MG capsule Take 0.5 mg by mouth at bedtime. 06/17/16  [provider]  tacrolimus (PROGRAF) 1 MG capsule Take 1 mg by mouth daily. 11/20/17   [provider]    Physical Exam: Pressure 177/85 pulse 95, respiratory rate 20, pulse ox 98% on room air Constitutional: Cachectic appearing female, pleasantly confused and picking on her sheets Eyes: PERRL, lids and conjunctivae normal ENMT: Mucous membranes are moist. Posterior pharynx clear of any exudate or lesions.Normal dentition.  Neck: normal, supple, no masses, no thyromegaly Respiratory: clear to auscultation bilaterally, no wheezing, no crackles. Normal respiratory effort. No accessory muscle use.  Cardiovascular: Regular rate and rhythm, no murmurs / rubs / gallops. No extremity edema. 2+ pedal pulses. No carotid bruits.  Abdomen: no tenderness, no masses palpated. No hepatosplenomegaly. Bowel sounds positive.  Musculoskeletal: Status post right BKA and has open wound/dry dressing adherent to it.  Status post amputation of left big toe Neurologic: CN 2-12 grossly  intact.  Status post right BKA. Strength 5/5 in all 4.  Psychiatric: Pleasantly confused  SKIN/catheters: She has indwelling Foley catheter, stump wound as described above  Labs on Admission: I have personally reviewed following labs and imaging studies  CBC: No results for input(s): WBC, NEUTROABS, HGB, HCT, MCV, PLT in the last 168 hours. Basic Metabolic Panel: No results for input(s): NA, K, CL, CO2, GLUCOSE, BUN, CREATININE, CALCIUM, MG, PHOS in the last 168 hours. GFR: CrCl cannot be calculated (Patient's most recent lab result is older than the maximum 21 days allowed.). Liver Function Tests: No results for input(s): AST, ALT, ALKPHOS, BILITOT, PROT, ALBUMIN in the last 168 hours. No results for input(s): LIPASE, AMYLASE in the last 168 hours. No results for input(s): AMMONIA in the last 168 hours. Coagulation Profile: No results for input(s): INR, PROTIME in the last 168 hours. Cardiac Enzymes: No results for input(s): CKTOTAL, CKMB, CKMBINDEX, TROPONINI in the last 168 hours. BNP (last 3 results) No results for input(s): PROBNP in the last 8760 hours. HbA1C: No results for input(s): HGBA1C in the last 72 hours. CBG: No results for input(s): GLUCAP in the last 168 hours. Lipid Profile: No results for input(s): CHOL, HDL, LDLCALC, TRIG, CHOLHDL, LDLDIRECT in the last 72 hours. Thyroid Function Tests: No results for input(s): TSH, T4TOTAL, FREET4, T3FREE, THYROIDAB in the last 72 hours. Anemia Panel: No results for input(s): VITAMINB12, FOLATE, FERRITIN, TIBC, IRON, RETICCTPCT in the last 72 hours. Urine analysis:    Component Value Date/Time   COLORURINE YELLOW 01/04/2018 1341   APPEARANCEUR CLOUDY (A) 01/04/2018 1341   LABSPEC 1.015 01/04/2018 1341   PHURINE 6.0 01/04/2018 1341   GLUCOSEU >=500 (A) 01/04/2018 1341   HGBUR LARGE (A) 01/04/2018 1341   BILIRUBINUR NEGATIVE 01/04/2018 1341   KETONESUR 20 (A) 01/04/2018 1341   PROTEINUR 100 (A) 01/04/2018 1341    UROBILINOGEN 0.2 03/02/2015 1153   NITRITE NEGATIVE 01/04/2018 1341   LEUKOCYTESUR MODERATE (A) 01/04/2018 1341    Please refer to imaging study results from outside records.  WBC 4.8, hemoglobin 6.5, hematocrit 21.2, platelets 59, sodium 140, potassium 4.7, chloride 116, bicarb 17, BUN 40, creatinine 3.1 and blood glucose 252 from this morning. Iron 59 TIBC 114% sat 51.7  Radiological studies: Tib-fib films: IMPRESSION: Focal osteopenia and subtle irregular cortical margins involving the tibial stump as best seen on the lateral view at the midpoint between the anterior and posterior cortices raise concern for changes of osteomyelitis. Markedly attenuated soft tissues adjacent to the tibial amputation margin is also noted.   Chest x-ray: Impression: No edema or consolidation. There  is aortic atherosclerosis.   Head CT: IMPRESSION: Chronic involutional changes of the brain with chronic mild microvascular ischemic disease. No acute intracranial abnormality.  MRI right lower extremity: IMPRESSION: 1. Right below-the-knee amputation through the proximal tibial diaphysis. Mild cortical irregularity and minimal marrow edema at the tip of the tibial stump likely reflecting postsurgical changes given recent surgery versus mild osteomyelitis. No drainable fluid collection to suggest an abscess. 2. Mild T2 hyperintense signal throughout the right thigh musculature and right lower leg muscles likely neurogenic versus secondary to myositis. 3. No drainable fluid collection.  -------------------------------------------------------------------------------------------------------------------------------     Assessment and Plan:   1.  Altered mental status/delirium: In the setting of infection.  Patient denies history of alcohol or drug abuse.  Will have Seroquel as needed available.  Check QTC on EKG.  CT head negative at outside hospital.  Holding psych medications including  Remeron/Abilify/Cymbalta/trazodone.  2.  Right stump wound/osteomyelitis: Continue IV antibiotics.  Blood cultures negative so far.  Will resume IV fluids given renal dysfunction.  Patient on immunosuppressants and pancytopenic.  Watch for fevers.  Will follow up ID recommendations and orthopedic to evaluate for possible right AKA.  3.  Acute renal failure in the setting of renal transplant/chronic kidney disease: Records patient's creatinine bumped to 3 but unclear baseline level.  Nephrology to evaluate patient.  Resume chronic immunosuppressants including tacrolimus/prednisone  4.  Diabetes mellitus: Treated for DKA at outside hospital and now on Lantus/sliding scale insulin.  Watch blood glucose levels.  5.  Hypertension: Continue Coreg, hold Lasix and Norvasc.  6.  Hypothyroidism: Synthroid  7.  Acute on chronic anemia: Stool for guaiac pending.  Patient initiated on 2 units of PRBC at outside hospital this morning.  Check posttransfusion hemoglobin.  Hold heparin for prophylaxis.   DVT prophylaxis: SCD on left lower extremity.  Holding anticoagulation in view of severe anemia requiring transfusion.  Code Status: Full code  Family Communication: Discussed with patient. Health care proxy would be  Consults called: Outside record "[facility], [facility],  Orthopedic team [facility] has been notified and consulted ...> please call upon arrival Infectious Disease team at: Was consulted please call upon arrival Nephrology Dr. Ar[facility]adonato [facility] ----> please call and notify upon arrival" Admission status:  Patient admitted as inpatient as anticipated LOS greater than 2 midnights    Alessandra BevelsNeelima Danelly Hassinger MD [facility] Pager [phone]  If 7PM-7AM, please contact night-coverage [email or web address] Password [email or web address]  16-Aug-2018, 3:45 PM

## 2018-07-23 DIAGNOSIS — E43 Unspecified severe protein-calorie malnutrition: Secondary | ICD-10-CM

## 2018-07-23 DIAGNOSIS — I120 Hypertensive chronic kidney disease with stage 5 chronic kidney disease or end stage renal disease: Secondary | ICD-10-CM

## 2018-07-23 DIAGNOSIS — Z87891 Personal history of nicotine dependence: Secondary | ICD-10-CM

## 2018-07-23 DIAGNOSIS — L89152 Pressure ulcer of sacral region, stage 2: Secondary | ICD-10-CM

## 2018-07-23 DIAGNOSIS — E1165 Type 2 diabetes mellitus with hyperglycemia: Secondary | ICD-10-CM

## 2018-07-23 DIAGNOSIS — E1122 Type 2 diabetes mellitus with diabetic chronic kidney disease: Secondary | ICD-10-CM

## 2018-07-23 DIAGNOSIS — N179 Acute kidney failure, unspecified: Secondary | ICD-10-CM

## 2018-07-23 DIAGNOSIS — D631 Anemia in chronic kidney disease: Secondary | ICD-10-CM

## 2018-07-23 DIAGNOSIS — M86261 Subacute osteomyelitis, right tibia and fibula: Secondary | ICD-10-CM

## 2018-07-23 DIAGNOSIS — D696 Thrombocytopenia, unspecified: Secondary | ICD-10-CM

## 2018-07-23 DIAGNOSIS — T8781 Dehiscence of amputation stump: Secondary | ICD-10-CM | POA: Diagnosis present

## 2018-07-23 DIAGNOSIS — N186 End stage renal disease: Secondary | ICD-10-CM

## 2018-07-23 DIAGNOSIS — L8990 Pressure ulcer of unspecified site, unspecified stage: Secondary | ICD-10-CM | POA: Diagnosis present

## 2018-07-23 DIAGNOSIS — E1042 Type 1 diabetes mellitus with diabetic polyneuropathy: Secondary | ICD-10-CM | POA: Diagnosis present

## 2018-07-23 LAB — GLUCOSE, CAPILLARY
GLUCOSE-CAPILLARY: 115 mg/dL — AB (ref 70–99)
GLUCOSE-CAPILLARY: 149 mg/dL — AB (ref 70–99)
GLUCOSE-CAPILLARY: 72 mg/dL (ref 70–99)
Glucose-Capillary: 174 mg/dL — ABNORMAL HIGH (ref 70–99)
Glucose-Capillary: 180 mg/dL — ABNORMAL HIGH (ref 70–99)
Glucose-Capillary: 108 mg/dL — ABNORMAL HIGH (ref 70–99)

## 2018-07-23 LAB — HEPATIC FUNCTION PANEL
ALT: 16 U/L (ref 0–44)
AST: 16 U/L (ref 15–41)
Albumin: 1.4 g/dL — ABNORMAL LOW (ref 3.5–5.0)
Alkaline Phosphatase: 124 U/L (ref 38–126)
Bilirubin, Direct: 0.1 mg/dL (ref 0.0–0.2)
Indirect Bilirubin: 0.6 mg/dL (ref 0.3–0.9)
TOTAL PROTEIN: 5 g/dL — AB (ref 6.5–8.1)
Total Bilirubin: 0.7 mg/dL (ref 0.3–1.2)

## 2018-07-23 LAB — CBC
HCT: 33.9 % — ABNORMAL LOW (ref 36.0–46.0)
Hemoglobin: 10.2 g/dL — ABNORMAL LOW (ref 12.0–15.0)
MCH: 26.5 pg (ref 26.0–34.0)
MCHC: 30.1 g/dL (ref 30.0–36.0)
MCV: 88.1 fL (ref 80.0–100.0)
Platelets: 65 10*3/uL — ABNORMAL LOW (ref 150–400)
RBC: 3.85 MIL/uL — ABNORMAL LOW (ref 3.87–5.11)
RDW: 19.7 % — ABNORMAL HIGH (ref 11.5–15.5)
WBC: 9.5 10*3/uL (ref 4.0–10.5)
nRBC: 0 % (ref 0.0–0.2)

## 2018-07-23 LAB — BASIC METABOLIC PANEL
Anion gap: 7 (ref 5–15)
BUN: 35 mg/dL — AB (ref 6–20)
CALCIUM: 8.5 mg/dL — AB (ref 8.9–10.3)
CO2: 19 mmol/L — ABNORMAL LOW (ref 22–32)
Chloride: 119 mmol/L — ABNORMAL HIGH (ref 98–111)
Creatinine, Ser: 3.23 mg/dL — ABNORMAL HIGH (ref 0.44–1.00)
GFR calc Af Amer: 18 mL/min — ABNORMAL LOW (ref >60)
GFR calc non Af Amer: 16 mL/min — ABNORMAL LOW (ref >60)
GLUCOSE: 140 mg/dL — AB (ref 70–99)
POTASSIUM: 4.1 mmol/L (ref 3.5–5.1)
Sodium: 145 mmol/L (ref 135–145)

## 2018-07-23 LAB — DIC (DISSEMINATED INTRAVASCULAR COAGULATION) PANEL
INR: 1.41
PLATELETS: 55 10*3/uL — AB (ref 150–400)
SMEAR REVIEW: NONE SEEN

## 2018-07-23 LAB — SAVE SMEAR (SSMR)

## 2018-07-23 LAB — DIC (DISSEMINATED INTRAVASCULAR COAGULATION)PANEL
D-Dimer, Quant: 1.82 ug/mL-FEU — ABNORMAL HIGH (ref 0.00–0.50)
Fibrinogen: 233 mg/dL (ref 210–475)
Prothrombin Time: 17.1 seconds — ABNORMAL HIGH (ref 11.4–15.2)
aPTT: 32 seconds (ref 24–36)

## 2018-07-23 LAB — CK: CK TOTAL: 21 U/L — AB (ref 38–234)

## 2018-07-23 MED ORDER — CLONIDINE HCL 0.1 MG PO TABS: 0.1000 mg | ORAL_TABLET | Freq: Once | ORAL | Status: DC

## 2018-07-23 MED ORDER — SODIUM BICARBONATE 8.4 % IV SOLN
INTRAVENOUS | Status: DC
  Administered 2018-07-23 – 2018-07-24 (×3): via INTRAVENOUS
  Filled 2018-07-23 (×6): qty 150

## 2018-07-23 MED ORDER — SODIUM CHLORIDE 0.9% FLUSH: 10.0000 mL | INTRAVENOUS | Status: DC | PRN

## 2018-07-23 MED ORDER — STERILE WATER FOR INJECTION IV SOLN: INTRAVENOUS | Status: DC

## 2018-07-23 MED ORDER — SODIUM BICARBONATE 650 MG PO TABS: 1300.0000 mg | ORAL_TABLET | Freq: Two times a day (BID) | ORAL | Status: DC

## 2018-07-23 MED ORDER — CHLORHEXIDINE GLUCONATE CLOTH 2 % EX PADS
6.0000 | MEDICATED_PAD | Freq: Every day | CUTANEOUS | Status: DC
  Administered 2018-07-24 – 2018-08-02 (×8): 6 via TOPICAL

## 2018-07-23 MED ORDER — SODIUM CHLORIDE 0.9% FLUSH
10.0000 mL | Freq: Two times a day (BID) | INTRAVENOUS | Status: DC
  Administered 2018-07-24 (×2): 10 mL

## 2018-07-23 MED ORDER — PREDNISONE 10 MG PO TABS
15.0000 mg | ORAL_TABLET | Freq: Every day | ORAL | Status: AC
  Administered 2018-07-23 – 2018-07-25 (×3): 15 mg via ORAL
  Filled 2018-07-23 (×3): qty 2

## 2018-07-23 MED ORDER — DEXTROSE 50 % IV SOLN
INTRAVENOUS | Status: AC
  Administered 2018-07-23: 25 mL
  Filled 2018-07-23: qty 50

## 2018-07-23 MED ORDER — GERHARDT'S BUTT CREAM
TOPICAL_CREAM | Freq: Two times a day (BID) | CUTANEOUS | Status: DC
  Administered 2018-07-23 – 2018-07-25 (×5): via TOPICAL
  Administered 2018-07-25: 1 via TOPICAL
  Administered 2018-07-26 – 2018-07-27 (×3): via TOPICAL
  Administered 2018-07-28: 1 via TOPICAL
  Administered 2018-07-29 – 2018-08-01 (×6): via TOPICAL
  Administered 2018-08-01: 1 via TOPICAL
  Administered 2018-08-02 (×2): via TOPICAL
  Filled 2018-07-23 (×2): qty 1

## 2018-07-23 MED ORDER — AMLODIPINE BESYLATE 5 MG PO TABS
5.0000 mg | ORAL_TABLET | Freq: Every day | ORAL | Status: DC
  Administered 2018-07-23 – 2018-07-25 (×3): 5 mg via ORAL
  Filled 2018-07-23 (×3): qty 1

## 2018-07-23 MED ORDER — LEVOTHYROXINE SODIUM 112 MCG PO TABS
112.0000 ug | ORAL_TABLET | Freq: Every day | ORAL | Status: DC
  Administered 2018-07-23 – 2018-07-25 (×3): 112 ug via ORAL
  Filled 2018-07-23 (×3): qty 1

## 2018-07-23 MED ORDER — PREDNISONE 10 MG PO TABS: 15.0000 mg | ORAL_TABLET | Freq: Every day | ORAL | Status: DC

## 2018-07-23 MED ORDER — PREDNISONE 5 MG PO TABS
5.0000 mg | ORAL_TABLET | Freq: Every day | ORAL | Status: DC
  Administered 2018-07-27 – 2018-07-30 (×3): 5 mg via ORAL
  Filled 2018-07-23 (×8): qty 1

## 2018-07-23 MED ORDER — SENNOSIDES-DOCUSATE SODIUM 8.6-50 MG PO TABS: 1.0000 | ORAL_TABLET | Freq: Two times a day (BID) | ORAL | Status: DC | PRN

## 2018-07-23 MED ORDER — SODIUM CHLORIDE 0.9 % IV SOLN: INTRAVENOUS | Status: DC

## 2018-07-23 NOTE — Consult Note (Signed)
ORTHOPAEDIC CONSULTATION  REQUESTING PHYSICIAN: Alberteen Samanford, Christopher P, *  Chief Complaint: Dehiscence right transtibial amputation.  HPI: [name] is a 52 y.o. female who presents with uncontrolled type 2 diabetes end-stage renal disease with severe protein caloric malnutrition with dehiscence necrosis of the right transtibial amputation.  Past Medical History:  Diagnosis Date  . Arthritis    "elbows, knees, legs, back" (09/24/2015)  . CKD (chronic kidney disease)   . Daily headache   . Depression    "years ago"  . End stage renal disease (HCC)    right arm AV graft, resolved post transplant  . Hypertension   . Hypothyroid   . 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 Kidney disease 2011  . Pneumonia ~ 2007?  [name]. Type II diabetes mellitus (HCC)    Insulin dependant   Past Surgical History:  Procedure Laterality Date  . ABDOMINAL AORTOGRAM W/LOWER EXTREMITY N/A 01/10/2018   Procedure: ABDOMINAL AORTOGRAM W/LOWER EXTREMITY;  Surgeon: Maeola Harmanain, Brandon Christopher, MD;  [facility];  Service: Cardiovascular;  Laterality: N/A;  . AMPUTATION Left 05/11/2014   Procedure: AMPUTATION LEFT GREAT TOE;  Surgeon: Kathryne Hitchhristopher Y Blackman, MD;  Location: [facility] ORS;  Service: Orthopedics;  Laterality: Left;  . AV FISTULA PLACEMENT Right    forearm  . BACK SURGERY    . CATARACT EXTRACTION W/ INTRAOCULAR LENS  IMPLANT, BILATERAL Bilateral   . CESAREAN SECTION  07/1999  . DG AV DIALYSIS GRAFT DECLOT OR    . HIP PINNING,CANNULATED Right 06/02/2017   Procedure: CANNULATED RIGHT HIP PINNING;  Surgeon: Beverely LowNorris, Steve, MD;  Location: [facility] ORS;  Service: Orthopedics;  Laterality: Right;  . INCISION AND DRAINAGE Right 06/02/2015   Procedure: INCISION AND DRAINAGE OF RIGHT 3rd RAY RESECTION;  Surgeon: Kathryne Hitchhristopher Y Blackman, MD;  Location: [facility] OR;  Service: Orthopedics;  Laterality: Right;  . 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 LUMBAR DISC SURGERY  2001  .  NEPHRECTOMY TRANSPLANTED ORGAN    . TOE AMPUTATION Right    1,2 & 3rd toes.  . TUBAL LIGATION  07/1999   Social History   Socioeconomic History  . Marital status: Married    Spouse name: Not on file  . Number of children: Not on file  . Years of education: Not on file  . Highest education level: Not on file  Occupational History  . Occupation: disabled  Social Needs  . Financial resource strain: Not on file  . Food insecurity:    Worry: Not on file    Inability: Not on file  . Transportation needs:    Medical: Not on file    Non-medical: Not on file  Tobacco Use  . Smoking status: Former Smoker    Packs/day: 1.00    Years: 11.00    Pack years: 11.00    Types: Cigarettes    Last attempt to quit: 09/21/1998    Years since quitting: 19.8  . Smokeless tobacco: Never Used  Substance and Sexual Activity  . Alcohol use: No  . Drug use: No  . Sexual activity: Never  Lifestyle  . Physical activity:    Days per week: Not on file    Minutes per session: Not on file  . Stress: Not on file  Relationships  . Social connections:    Talks on phone: Not on file    Gets together: Not on file    Attends religious service: Not on file    Active  member of club or organization: Not on file    Attends meetings of clubs or organizations: Not on file    Relationship status: Not on file  Other Topics Concern  . Not on file  Social History Narrative  . Not on file   Family History  Problem Relation Age of Onset  . Emphysema Mother   . Throat cancer Mother   . COPD Mother   . Cancer Mother   . Emphysema Father   . COPD Father   . Stroke Father   . ADD / ADHD Son    - negative except otherwise stated in the family history section Allergies  Allergen Reactions  . Latex Itching  . Ace Inhibitors Cough  . Adhesive [Tape] Itching and Other (See Comments)    Please use paper tape  . Lisinopril Cough   Prior to Admission medications   Medication Sig Start Date End Date Taking?  Authorizing Provider  ARIPiprazole (ABILIFY) 2 MG tablet Take 2 mg by mouth daily.   Yes [provider]  carbamide peroxide (DEBROX) 6.5 % OTIC solution Place 10 drops into the left ear 2 (two) times daily.    Yes [provider]  carvedilol (COREG) 6.25 MG tablet Take 6.25 mg by mouth 2 (two) times daily with a meal. HOLD if SBP <110 or HR <60   Yes [provider]  Cholecalciferol (VITAMIN D) 50 MCG (2000 UT) tablet Take 2,000 Units by mouth daily.   Yes [provider]  glucagon (GLUCAGON EMERGENCY) 1 MG injection Inject 1 mg into the vein once as needed (CBG <60 or if resident cannot swallow. If second dose is needed call provider).   Yes [provider]  insulin aspart (NOVOLOG) 100 UNIT/ML injection Inject 4-10 Units into the skin See admin instructions. Inject 4-10 units subcutaneously before meals and at bedtime per sliding scale: CBG 201-250 4 units, 251-300 6 units, 301-350 8 units, 351-400 10 units, >400 notify provider.   Yes [provider]  insulin detemir (LEVEMIR) 100 UNIT/ML injection Inject 8 Units into the skin daily.   Yes [provider]  levothyroxine (SYNTHROID, LEVOTHROID) 112 MCG tablet Take 112 mcg by mouth daily.   Yes [provider]  mirtazapine (REMERON) 7.5 MG tablet Take 7.5 mg by mouth at bedtime.   Yes [provider]  Multiple Vitamins-Minerals (MULTIVITAMIN WITH MINERALS) tablet Take 1 tablet by mouth daily.   Yes [provider]  Nutritional Supplements (NUTRITIONAL SUPPLEMENT PO) Take 120 mLs by mouth 4 (four) times daily. [employer]   Yes [provider]  oxyCODONE (OXY IR/ROXICODONE) 5 MG immediate release tablet Take 5 mg by mouth every 4 (four) hours as needed for moderate pain or severe pain.   Yes [provider]  predniSONE (DELTASONE) 5 MG tablet Take 5 mg by mouth daily.  05/29/15  Yes [provider]  PROTEIN PO Take by mouth 2 (two) times  daily. For wound healing   Yes [provider]  senna-docusate (SENOKOT-S) 8.6-50 MG tablet Take 1 tablet by mouth at bedtime. Patient taking differently: Take 1 tablet by mouth every 12 (twelve) hours as needed (constipation).  11/12/17  Yes Albertine Grates, MD  sodium bicarbonate 650 MG tablet Take 2 tablets (1,300 mg total) by mouth 2 (two) times daily. 09/04/17  Yes Penny Pia, MD  tacrolimus (PROGRAF) 1 MG capsule Take 1 mg by mouth daily.  11/20/17  Yes [provider]  vitamin C (ASCORBIC ACID) 500 MG tablet  Take 500 mg by mouth 2 (two) times daily.   Yes [provider]  acetaminophen (TYLENOL) 325 MG tablet Take 1 tablet (325 mg total) by mouth every 6 (six) hours as needed for mild pain, moderate pain or headache. Patient not taking: Reported on 07/30/2018 11/12/17   Albertine Grates, MD  amLODipine (NORVASC) 5 MG tablet Take 1 tablet (5 mg total) by mouth daily. Patient not taking: Reported on 08/03/2018 11/20/17   Burnadette Pop, MD  carvedilol (COREG) 25 MG tablet Take 1 tablet (25 mg total) by mouth 2 (two) times daily with a meal. Patient not taking: Reported on 07/12/2018 11/12/17   Albertine Grates, MD  collagenase (SANTYL) ointment Apply topically daily. Patient not taking: Reported on 07/12/2018 01/12/18   Zannie Cove, MD  HYDROcodone-acetaminophen (NORCO/VICODIN) 5-325 MG tablet Take 1 tablet by mouth every 6 (six) hours as needed for moderate pain. Patient not taking: Reported on 07/11/2018 01/01/18   Bethann Berkshire, MD  lip balm Karlton Lemon) OINT Apply 1 application topically as needed for lip care. Patient not taking: Reported on 07/09/2018 11/12/17   Albertine Grates, MD  pentoxifylline (TRENTAL) 400 MG CR tablet Take 1 tablet (400 mg total) by mouth 3 (three) times daily with meals. Patient not taking: Reported on 08/02/2018 01/11/18   Zannie Cove, MD   No results found. - pertinent xrays, CT, MRI studies were reviewed and independently interpreted  Positive ROS: All other  systems have been reviewed and were otherwise negative with the exception of those mentioned in the HPI and as above.  Physical Exam: General: Alert, no acute distress Psychiatric: Patient is competent for consent with normal mood and affect Lymphatic: No axillary or cervical lymphadenopathy Cardiovascular: No pedal edema Respiratory: No cyanosis, no use of accessory musculature GI: No organomegaly, abdomen is soft and non-tender    Images:  @ENCIMAGES @  Labs:  Lab Results  Component Value Date   HGBA1C 8.4 (H) 01/09/2018   HGBA1C 8.9 (H) 11/11/2017   HGBA1C 9.4 (H) 04/25/2017   ESRSEDRATE 93 (H) 01/04/2018   ESRSEDRATE 100 (H) 11/05/2017   ESRSEDRATE 22 06/28/2016   CRP 3.9 (H) 01/04/2018   CRP 32.3 (H) 11/05/2017   CRP 7.5 (H) 06/28/2016   REPTSTATUS 01/05/2018 FINAL 01/04/2018   GRAMSTAIN  06/27/2016    MODERATE WBC PRESENT, PREDOMINANTLY PMN ABUNDANT GRAM POSITIVE COCCI IN PAIRS IN CLUSTERS Performed at [facility]    CULT MULTIPLE SPECIES PRESENT, SUGGEST RECOLLECTION (A) 01/04/2018   LABORGA METHICILLIN RESISTANT STAPHYLOCOCCUS AUREUS 06/27/2016    Lab Results  Component Value Date   ALBUMIN 1.5 (L) 07/20/2018   ALBUMIN 1.8 (L) 01/06/2018   ALBUMIN 1.8 (L) 01/05/2018    Neurologic: Patient does not have protective sensation bilateral lower extremities.   MUSCULOSKELETAL:   Skin: Examination patient has necrotic tissue with exposed bone from the right transtibial amputation.  There is no ascending cellulitis.  Patient has severe protein caloric malnutrition with an albumin of 1.5.  Hemoglobin A1c 8.4.  Review of the MRI scan shows osteomyelitis abscess and necrosis of the transtibial amputation.  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  Plan: Patient will need surgical intervention for  conversion of the below-knee amputation to above-knee amputation.  Patient needs significant nutritional improvement for wound healing.  Thank you for the consult and the opportunity to see Ms. Vernie Shanks, MD [facility] [phone] 10:13 AM

## 2018-07-23 NOTE — Evaluation (Signed)
Physical Therapy Evaluation Patient Details Name: [name] MRN: [id number] DOB: 1965/11/11 Today's Date: 07/23/2018   History of Present Illness  52 y.o. 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 Patient demonstrates deficits in functional mobility as indicated below. Will benefit from continued skilled PT to address deficits and maximize function. Will see as indicated and progress as tolerated.  Will likely need SNF, awaiting update regarding POC for Right residual limb    Follow Up Recommendations SNF    Equipment Recommendations  (TBD)    Recommendations for Other Services       Precautions / Restrictions Precautions Precautions: Fall Precaution Comments: wound and skin issues Restrictions Weight Bearing Restrictions: No      Mobility  Bed Mobility Overal bed mobility: Needs Assistance Bed Mobility: Rolling;Supine to Sit Rolling: Mod assist   Supine to sit: Total assist     General bed mobility comments: moderate assist to roll bilateral, initial attempt to come to upright but patient unable to perform and limited by stool incontinence and significant skin irriatation and pain  Transfers                 General transfer comment: unable to perform  Ambulation/Gait             General Gait Details: unable to perform  Stairs            Wheelchair Mobility    Modified Rankin (Stroke Patients Only)       Balance                                             Pertinent Vitals/Pain Pain Assessment: Faces Faces Pain Scale: Hurts whole lot Pain Descriptors / Indicators: Discomfort;Grimacing;Guarding;Headache Pain Intervention(s): Limited activity within  patient's tolerance;Monitored during session;Repositioned    Home Living Family/patient expects to be discharged to:: Private residence Living Arrangements: Spouse/significant other Available Help at Discharge: Family;Available 24 hours/day Type of Home: Mobile home Home Access: Stairs to enter Entrance Stairs-Rails: Right;Left;Can reach both Entrance Stairs-Number of Steps: 6 in the front Home Layout: One level Home Equipment: Walker - 2 wheels;Bedside commode;Shower seat;Wheelchair - manual;Grab bars - tub/shower Additional Comments: information obtained from chart review of previous admission as currently patient is extremely confused and poor historian    Prior Function Level of Independence: Needs assistance   Gait / Transfers Assistance Needed: pt uses both RW and w/c t/o day fo rmobility  ADL's / Homemaking Assistance Needed: pt can perform dressing and bathing spouse does household chores  Comments: pt has been receiving HH PT for about a month     Hand Dominance   Dominant Hand: Right    Extremity/Trunk Assessment   Upper Extremity Assessment Upper Extremity Assessment: Generalized weakness    Lower Extremity Assessment Lower Extremity Assessment: RLE deficits/detail;LLE deficits/detail;Difficult to assess due to impaired cognition RLE: Unable to fully assess due to pain RLE Sensation: decreased light touch;decreased proprioception RLE Coordination: decreased fine motor;decreased gross motor LLE: Unable to fully assess due to pain LLE Coordination: decreased fine motor;decreased gross motor       Communication   Communication:  No difficulties  Cognition Arousal/Alertness: Awake/alert Behavior During Therapy: Anxious Overall Cognitive Status: Impaired/Different from baseline Area of Impairment: Orientation;Attention;Memory;Following commands;Safety/judgement;Awareness;Problem solving                 Orientation Level: Disoriented  to;Place;Time;Situation Current Attention Level: Sustained   Following Commands: Follows one step commands inconsistently Safety/Judgement: Decreased awareness of safety;Decreased awareness of deficits Awareness: Intellectual Problem Solving: Slow processing        General Comments      Exercises     Assessment/Plan    PT Assessment Patient needs continued PT services  PT Problem List Decreased strength;Decreased activity tolerance;Decreased balance;Decreased mobility;Decreased coordination;Decreased cognition;Decreased knowledge of use of DME;Decreased safety awareness;Impaired sensation;Decreased skin integrity;Pain       PT Treatment Interventions DME instruction;Therapeutic activities;Gait training;Therapeutic exercise;Balance training;Functional mobility training;Cognitive remediation;Patient/family education;Wheelchair mobility training;Manual techniques    PT Goals (Current goals can be found in the Care Plan section)  Acute Rehab PT Goals Patient Stated Goal: none stated PT Goal Formulation: Patient unable to participate in goal setting Time For Goal Achievement: 08/06/18 Potential to Achieve Goals: Fair    Frequency Min 2X/week   Barriers to discharge Decreased caregiver support      Co-evaluation               AM-PAC PT "6 Clicks" Daily Activity  Outcome Measure Difficulty turning over in bed (including adjusting bedclothes, sheets and blankets)?: Unable Difficulty moving from lying on back to sitting on the side of the bed? : Unable Difficulty sitting down on and standing up from a chair with arms (e.g., wheelchair, bedside commode, etc,.)?: Unable Help needed moving to and from a bed to chair (including a wheelchair)?: Total Help needed walking in hospital room?: Total Help needed climbing 3-5 steps with a railing? : Total 6 Click Score: 6    End of Session   Activity Tolerance: Patient limited by pain     PT Visit Diagnosis: Muscle weakness  (generalized) (M62.81);Other symptoms and signs involving the nervous system (R29.898)    Time: [id number] PT Time Calculation (min) (ACUTE ONLY): 21 min   Charges:   PT Evaluation $PT Eval Moderate Complexity: 1 Mod          Charlotte Crumbevon Mylik Pro, PT DPT  Board Certified Neurologic Specialist Acute Rehabilitation Services Pager [phone] Office [phone]   Fabio AsaDevon J Malita Ignasiak 07/23/2018, 12:45 PM

## 2018-07-23 NOTE — H&P (View-Only) (Signed)
  ORTHOPAEDIC CONSULTATION  REQUESTING PHYSICIAN: Danford, Christopher P, *  Chief Complaint: Dehiscence right transtibial amputation.  HPI: [name] is a 52 y.o. female who presents with uncontrolled type 2 diabetes end-stage renal disease with severe protein caloric malnutrition with dehiscence necrosis of the right transtibial amputation.  Past Medical History:  Diagnosis Date  . Arthritis    "elbows, knees, legs, back" (09/24/2015)  . CKD (chronic kidney disease)   . Daily headache   . Depression    "years ago"  . End stage renal disease (HCC)    right arm AV graft, resolved post transplant  . Hypertension   . Hypothyroid   . 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 Kidney disease 2011  . Pneumonia ~ 2007?  . Type II diabetes mellitus (HCC)    Insulin dependant   Past Surgical History:  Procedure Laterality Date  . ABDOMINAL AORTOGRAM W/LOWER EXTREMITY N/A 01/10/2018   Procedure: ABDOMINAL AORTOGRAM W/LOWER EXTREMITY;  Surgeon: Cain, Brandon Christopher, MD;  Location: [facility];  Service: Cardiovascular;  Laterality: N/A;  . AMPUTATION Left 05/11/2014   Procedure: AMPUTATION LEFT GREAT TOE;  Surgeon: Christopher Y Blackman, MD;  Location: [facility];  Service: Orthopedics;  Laterality: Left;  . AV FISTULA PLACEMENT Right    forearm  . BACK SURGERY    . CATARACT EXTRACTION W/ INTRAOCULAR LENS  IMPLANT, BILATERAL Bilateral   . CESAREAN SECTION  07/1999  . DG AV DIALYSIS GRAFT DECLOT OR    . HIP PINNING,CANNULATED Right 06/02/2017   Procedure: CANNULATED RIGHT HIP PINNING;  Surgeon: Norris, Steve, MD;  Location: [facility];  Service: Orthopedics;  Laterality: Right;  . INCISION AND DRAINAGE Right 06/02/2015   Procedure: INCISION AND DRAINAGE OF RIGHT 3rd RAY RESECTION;  Surgeon: Christopher Y Blackman, MD;  Location: [facility];  Service: Orthopedics;  Laterality: Right;  . 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 LUMBAR DISC SURGERY  2001  .  NEPHRECTOMY TRANSPLANTED ORGAN    . TOE AMPUTATION Right    1,2 & 3rd toes.  . TUBAL LIGATION  07/1999   Social History   Socioeconomic History  . Marital status: Married    Spouse name: Not on file  . Number of children: Not on file  . Years of education: Not on file  . Highest education level: Not on file  Occupational History  . Occupation: disabled  Social Needs  . Financial resource strain: Not on file  . Food insecurity:    Worry: Not on file    Inability: Not on file  . Transportation needs:    Medical: Not on file    Non-medical: Not on file  Tobacco Use  . Smoking status: Former Smoker    Packs/day: 1.00    Years: 11.00    Pack years: 11.00    Types: Cigarettes    Last attempt to quit: 09/21/1998    Years since quitting: 19.8  . Smokeless tobacco: Never Used  Substance and Sexual Activity  . Alcohol use: No  . Drug use: No  . Sexual activity: Never  Lifestyle  . Physical activity:    Days per week: Not on file    Minutes per session: Not on file  . Stress: Not on file  Relationships  . Social connections:    Talks on phone: Not on file    Gets together: Not on file    Attends religious service: Not on file    Active   member of club or organization: Not on file    Attends meetings of clubs or organizations: Not on file    Relationship status: Not on file  Other Topics Concern  . Not on file  Social History Narrative  . Not on file   Family History  Problem Relation Age of Onset  . Emphysema Mother   . Throat cancer Mother   . COPD Mother   . Cancer Mother   . Emphysema Father   . COPD Father   . Stroke Father   . ADD / ADHD Son    - negative except otherwise stated in the family history section Allergies  Allergen Reactions  . Latex Itching  . Ace Inhibitors Cough  . Adhesive [Tape] Itching and Other (See Comments)    Please use paper tape  . Lisinopril Cough   Prior to Admission medications   Medication Sig Start Date End Date Taking?  Authorizing Provider  ARIPiprazole (ABILIFY) 2 MG tablet Take 2 mg by mouth daily.   Yes [provider]  carbamide peroxide (DEBROX) 6.5 % OTIC solution Place 10 drops into the left ear 2 (two) times daily.    Yes [provider]  carvedilol (COREG) 6.25 MG tablet Take 6.25 mg by mouth 2 (two) times daily with a meal. HOLD if SBP <110 or HR <60   Yes [provider]  Cholecalciferol (VITAMIN D) 50 MCG (2000 UT) tablet Take 2,000 Units by mouth daily.   Yes [provider]  glucagon (GLUCAGON EMERGENCY) 1 MG injection Inject 1 mg into the vein once as needed (CBG <60 or if resident cannot swallow. If second dose is needed call provider).   Yes [provider]  insulin aspart (NOVOLOG) 100 UNIT/ML injection Inject 4-10 Units into the skin See admin instructions. Inject 4-10 units subcutaneously before meals and at bedtime per sliding scale: CBG 201-250 4 units, 251-300 6 units, 301-350 8 units, 351-400 10 units, >400 notify provider.   Yes [provider]  insulin detemir (LEVEMIR) 100 UNIT/ML injection Inject 8 Units into the skin daily.   Yes [provider]  levothyroxine (SYNTHROID, LEVOTHROID) 112 MCG tablet Take 112 mcg by mouth daily.   Yes [provider]  mirtazapine (REMERON) 7.5 MG tablet Take 7.5 mg by mouth at bedtime.   Yes [provider]  Multiple Vitamins-Minerals (MULTIVITAMIN WITH MINERALS) tablet Take 1 tablet by mouth daily.   Yes [provider]  Nutritional Supplements (NUTRITIONAL SUPPLEMENT PO) Take 120 mLs by mouth 4 (four) times daily. [employer]   Yes [provider]  oxyCODONE (OXY IR/ROXICODONE) 5 MG immediate release tablet Take 5 mg by mouth every 4 (four) hours as needed for moderate pain or severe pain.   Yes [provider]  predniSONE (DELTASONE) 5 MG tablet Take 5 mg by mouth daily.  05/29/15  Yes [provider]  PROTEIN PO Take by mouth 2 (two) times  daily. For wound healing   Yes [provider]  senna-docusate (SENOKOT-S) 8.6-50 MG tablet Take 1 tablet by mouth at bedtime. Patient taking differently: Take 1 tablet by mouth every 12 (twelve) hours as needed (constipation).  11/12/17  Yes Xu, Fang, MD  sodium bicarbonate 650 MG tablet Take 2 tablets (1,300 mg total) by mouth 2 (two) times daily. 09/04/17  Yes Vega, Orlando, MD  tacrolimus (PROGRAF) 1 MG capsule Take 1 mg by mouth daily.  11/20/17  Yes [provider]  vitamin C (ASCORBIC ACID) 500 MG tablet   Take 500 mg by mouth 2 (two) times daily.   Yes [provider]  acetaminophen (TYLENOL) 325 MG tablet Take 1 tablet (325 mg total) by mouth every 6 (six) hours as needed for mild pain, moderate pain or headache. Patient not taking: Reported on 07/14/2018 11/12/17   Xu, Fang, MD  amLODipine (NORVASC) 5 MG tablet Take 1 tablet (5 mg total) by mouth daily. Patient not taking: Reported on 07/28/2018 11/20/17   Adhikari, Amrit, MD  carvedilol (COREG) 25 MG tablet Take 1 tablet (25 mg total) by mouth 2 (two) times daily with a meal. Patient not taking: Reported on 07/17/2018 11/12/17   Xu, Fang, MD  collagenase (SANTYL) ointment Apply topically daily. Patient not taking: Reported on 07/14/2018 01/12/18   Joseph, Preetha, MD  HYDROcodone-acetaminophen (NORCO/VICODIN) 5-325 MG tablet Take 1 tablet by mouth every 6 (six) hours as needed for moderate pain. Patient not taking: Reported on 07/08/2018 01/01/18   Zammit, Joseph, MD  lip balm (BLISTEX) OINT Apply 1 application topically as needed for lip care. Patient not taking: Reported on 07/20/2018 11/12/17   Xu, Fang, MD  pentoxifylline (TRENTAL) 400 MG CR tablet Take 1 tablet (400 mg total) by mouth 3 (three) times daily with meals. Patient not taking: Reported on 07/13/2018 01/11/18   Joseph, Preetha, MD   No results found. - pertinent xrays, CT, MRI studies were reviewed and independently interpreted  Positive ROS: All other  systems have been reviewed and were otherwise negative with the exception of those mentioned in the HPI and as above.  Physical Exam: General: Alert, no acute distress Psychiatric: Patient is competent for consent with normal mood and affect Lymphatic: No axillary or cervical lymphadenopathy Cardiovascular: No pedal edema Respiratory: No cyanosis, no use of accessory musculature GI: No organomegaly, abdomen is soft and non-tender    Images:  @ENCIMAGES@  Labs:  Lab Results  Component Value Date   HGBA1C 8.4 (H) 01/09/2018   HGBA1C 8.9 (H) 11/11/2017   HGBA1C 9.4 (H) 04/25/2017   ESRSEDRATE 93 (H) 01/04/2018   ESRSEDRATE 100 (H) 11/05/2017   ESRSEDRATE 22 06/28/2016   CRP 3.9 (H) 01/04/2018   CRP 32.3 (H) 11/05/2017   CRP 7.5 (H) 06/28/2016   REPTSTATUS 01/05/2018 FINAL 01/04/2018   GRAMSTAIN  06/27/2016    MODERATE WBC PRESENT, PREDOMINANTLY PMN ABUNDANT GRAM POSITIVE COCCI IN PAIRS IN CLUSTERS Performed at [facility]    CULT MULTIPLE SPECIES PRESENT, SUGGEST RECOLLECTION (A) 01/04/2018   LABORGA METHICILLIN RESISTANT STAPHYLOCOCCUS AUREUS 06/27/2016    Lab Results  Component Value Date   ALBUMIN 1.5 (L) 07/18/2018   ALBUMIN 1.8 (L) 01/06/2018   ALBUMIN 1.8 (L) 01/05/2018    Neurologic: Patient does not have protective sensation bilateral lower extremities.   MUSCULOSKELETAL:   Skin: Examination patient has necrotic tissue with exposed bone from the right transtibial amputation.  There is no ascending cellulitis.  Patient has severe protein caloric malnutrition with an albumin of 1.5.  Hemoglobin A1c 8.4.  Review of the MRI scan shows osteomyelitis abscess and necrosis of the transtibial amputation.  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  Plan: Patient will need surgical intervention for  conversion of the below-knee amputation to above-knee amputation.  Patient needs significant nutritional improvement for wound healing.  Thank you for the consult and the opportunity to see [name]  Marcus Duda, MD [facility] [phone] 10:13 AM     

## 2018-07-23 NOTE — Progress Notes (Signed)
S:More awake and alert today but still confused O:BP (!) 173/94   Pulse (!) 105   Temp 97.6 F (36.4 C) (Oral)   Resp 15   Ht 5\' 8"  (1.727 m)   Wt 63 kg   LMP 11/19/2011 Comment: pt had tubal ligation  SpO2 95%   BMI 21.12 kg/m   Intake/Output Summary (Last 24 hours) at 07/23/2018 1229 Last data filed at 07/23/2018 0700 Gross per 24 hour  Intake 2375.48 ml  Output 500 ml  Net 1875.48 ml   Intake/Output: I/O last 3 completed shifts: In: 2375.5 [I.V.:2169.6; IV Piggyback:205.9] Out: 500 [Urine:500]  Intake/Output this shift:  No intake/output data recorded. Weight change:  Gen: NAD CVS: tachy Resp: cta Abd: benign Ext: no edema, s/p RBKA with ulcer at stumps end  Recent Labs  Lab 08/03/2018 1801 07/23/18 0435 07/23/18 1059  NA 146* 145  --   K 4.4 4.1  --   CL 122* 119*  --   CO2 18* 19*  --   GLUCOSE 60* 140*  --   BUN 38* 35*  --   CREATININE 3.31* 3.23*  --   ALBUMIN 1.5*  --  1.4*  CALCIUM 8.9 8.5*  --   PHOS 3.1  --   --   AST  --   --  16  ALT  --   --  16   Liver Function Tests: Recent Labs  Lab 07/11/2018 1801 07/23/18 1059  AST  --  16  ALT  --  16  ALKPHOS  --  124  BILITOT  --  0.7  PROT  --  5.0*  ALBUMIN 1.5* 1.4*   No results for input(s): LIPASE, AMYLASE in the last 168 hours. No results for input(s): AMMONIA in the last 168 hours. CBC: Recent Labs  Lab 07/28/2018 1801 07/23/18 0435 07/23/18 1059  WBC 8.4 9.5  --   HGB 10.4* 10.2*  --   HCT 33.7* 33.9*  --   MCV 88.9 88.1  --   PLT 69* 65* 55*   Cardiac Enzymes: Recent Labs  Lab 07/23/18 0435  CKTOTAL 21*   CBG: Recent Labs  Lab 07/19/2018 1936 07/23/18 0058 07/23/18 0410 07/23/18 0832 07/23/18 1135  GLUCAP 26* 72 115* 174* 180*    Iron Studies: No results for input(s): IRON, TIBC, TRANSFERRIN, FERRITIN in the last 72 hours. Studies/Results: No results found. . insulin aspart  0-9 Units Subcutaneous TID WC  . insulin glargine  10 Units Subcutaneous QHS  .  levothyroxine  112 mcg Oral Daily  . nystatin   Topical BID  . predniSONE  15 mg Oral Q breakfast   Followed by  . [START ON 07/26/2018] predniSONE  5 mg Oral Q breakfast  . tacrolimus  1 mg Oral QPC breakfast    BMET    Component Value Date/Time   NA 145 07/23/2018 0435   K 4.1 07/23/2018 0435   CL 119 (H) 07/23/2018 0435   CO2 19 (L) 07/23/2018 0435   GLUCOSE 140 (H) 07/23/2018 0435   BUN 35 (H) 07/23/2018 0435   CREATININE 3.23 (H) 07/23/2018 0435   CREATININE 1.07 10/29/2014 1005   CALCIUM 8.5 (L) 07/23/2018 0435   GFRNONAA 16 (L) 07/23/2018 0435   GFRAA 18 (L) 07/23/2018 0435   CBC    Component Value Date/Time   WBC 9.5 07/23/2018 0435   RBC 3.85 (L) 07/23/2018 0435   HGB 10.2 (L) 07/23/2018 0435   HCT 33.9 (L) 07/23/2018 0435   PLT  55 (L) 07/23/2018 1059   MCV 88.1 07/23/2018 0435   MCH 26.5 07/23/2018 0435   MCHC 30.1 07/23/2018 0435   RDW 19.7 (H) 07/23/2018 0435   LYMPHSABS 0.7 01/04/2018 1341   MONOABS 0.3 01/04/2018 1341   EOSABS 0.0 01/04/2018 1341   BASOSABS 0.0 01/04/2018 1341     Assessment/Plan: 1.  AKI/CKD stage 3- in setting of DKA.  Off of insulin drip.  Appears to be volume depleted.  continue IVF's with isotonic bicarb due to metabolic acidosis.  Will continue to follow and change to NS when CO2 normalizes. 2. Metabolic acidosis/DKA- off insulin, continue with bicarb and follow 3. Pancytopenia- possibly related to sepsis but also on DDx is drug related as she has been on daptomycin and rocephin.  She received 2 units of PRBC's prior to transfer. 4. Osteomyelitis of R BKA stump- plan for revision to AKA when stable 5. Anemia- has some anemia of CKD stage 3 as well as critical illness.  Transfuse and follow. 6. S/p DDKT- cont with prograf and prednisone.  Will check prograf level and adjust dosing. 7. HTN- continue with carvedilol but hold lasix.  Could also add amlodipine 10mg  daily if remains elevated. 8. Severe protein malnutrition- per  primary 9. Sepsis- abx per primary svc and Ortho  [name], MD [facility] [phone]

## 2018-07-23 NOTE — Progress Notes (Signed)
PROGRESS NOTE    [name]  ZOX:[id number] DOB: [birth year] DOA: 08/02/2018 PCP: Dois Davenport, MD      Brief Narrative:  [name] is a 52 y.o. 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  Discussed with Orthopedics, ID and Nephrology here and transferred for plan for possible AKA.  Patient incidentally noted to have new thrombocytopenia.      Assessment & Plan:  Thrombocytopenia This is new from May.  At admission to OSH on 11/12, platelets 106K, coags normal. She got fluids and by next morning 11/13, platelets down to 68K, Hgb down to 7.3 g/dL.   Was on Linezolid prior to admission, changed to Ceftriaxone/Daptomycin since.  She was on heparin at the OSH from 11/12-11/15.  Ddx here includes med effect, TTP, DIC, HIT.  Was on linezolid prior to admission, drop in Plts really not consistent with heparin induced or from CTX, dapto.  Clinical picture certainly consistent with TTP, given new AKI, clearly altered mentation, anemia requiring transfusion at OSH.  She was initially diagnosed with sepsis, which could cause DIC, although no clinical bleeding/oozing is noted. Doubt ITP given associated anemia, confusion. -Consult hematology, appreciate expert guidance -Check INR, ptt -Check dimer, fibrinogen -Obtain periph smear -Check bilirubin   Altered mental status Baseline unknown, but previous documentation suggests that patient has no cognitive impairment nor memory loss at baseline, cares for herself.  Currently, she is very disoriented.  Ddx includes AMS from sepsis metabolic encphalopathy vs TTP.    Osteomyelitis There is no surrounding cellulitis.  -Continue ceftriaxone, Daptomycin -Consult Orthopedics, appreciate cares  Anemia, acute Hgb on admission 8.9  g/dL consistent with patient's baseline anemia of chronic kidney disease.  It did drop to 6.5g/dL with fluid resuscitation, transfused at OSH. -Trend Hgb  AKI Recent baseline Cr 2.4, worse up to 2.9 on admission, worsening since.  UA on admission showed negligible RBCs, WBCs TNTC.  Cr no change, 3.2 today. -Consult Nephrology, appreciate cares -Continue tacrolimus, pred at stress dose for 3 days  Diarrhea Monitor for now.  Diabetes Poorly controlled last A1c 8.4%. -Continue glargine -Continue SSI  Hypertension -Hold carvedilol until hemodynamics clearer  Stage II, sacrum, present on admission  Bipolar -Hold Abilify and mirtazapine  Hypothyroidism -Continue levothyroxine  Other medications -Continue oxycodone for now      MDM and disposition: The below labs and imaging reports were reviewed and summarized above.  Medication management as above.  The patient was admitted with confusion, found to have osteo, acidosis, worsenign renal function, and new thrombocytopenia.   CRITICAL CARE The patient presents with acute renal failure, altered mental status, new severe hematologic abnormality.  Performed by: Alberteen Sam Total critical care time: 60 minutes.  Critical care time was exclusive of separately billable procedures and treating other patients.  Critical care was necessary to treat or prevent imminent or life-threatening deterioration.  Critical care was time spent personally by me on the following activities: development of treatment plan with patient and/or surrogate as well as nursing, discussions with consultants, evaluation of patient's response to treatment, examination of patient, obtaining history from patient or surrogate, ordering and performing treatments and interventions, ordering and review of laboratory studies, ordering and review of radiographic studies, pulse oximetry and re-evaluation of patient's condition.  DVT prophylaxis: SCDs Code  Status: FULL Family Communication: None present    Consultants:   Hematology  Nephrology  Orthopedics  Procedures:     Antimicrobials:   Linezolid prior to admission  Ceftriaxone 11/12 >>  Daptomycin 11/12 >>    Subjective: Confused.  Diarrhea noted per nursing.  No fever, respiratory distress, complaints of pain.  Objective: Vitals:   07/23/18 0604 07/23/18 0630 07/23/18 0700 07/23/18 0836  BP: (!) 154/96 (!) 173/89 (!) 165/92   Pulse: 98 (!) 102 (!) 105   Resp: 17 20 15    Temp:    98 F (36.7 C)  TempSrc:    Oral  SpO2: 98% 95% 95%   Weight:      Height:        Intake/Output Summary (Last 24 hours) at 07/23/2018 1022 Last data filed at 07/23/2018 0700 Gross per 24 hour  Intake 2375.48 ml  Output 500 ml  Net 1875.48 ml   Filed Weights   08/06/2018 1700 07/23/18 0440  Weight: 54.9 kg 63 kg    Examination: General appearance: elderly adult female, awake but confused HEENT: Mildly icteric, conjunctiva pink, lids and lashes normal. No nasal deformity, discharge, epistaxis.  Lips dry, no oral lesions, OP very dry, dentition mostly edentulous.  Hearing normal Skin: Warm and dry.  No jaundice.  No suspicious rashes or lesions. Cardiac: RRR, nl S1-S2, no murmurs appreciated.  Capillary refill is brisk.  JVP not visible.  No LE edema.  Radia  pulses 2+ and symmetric. Respiratory: Normal respiratory rate and rhythm.  CTAB without rales or wheezes. Abdomen: Abdomen soft.  Nonfocal  TTP with voluntary gaurding, no rigidity. No ascites, distension, hepatosplenomegaly.   MSK: No deformities or effusions. Neuro: Awake and alert.  EOMI, moves all extremities with equal and 5/5 strength. Speech dysrthric due to dry mouth.    Psych: Sensorium intact and responding to questions, attention diminished.  States she is at [facility] year is "2010", unable to articulate anything else. Affect blunted.  Judgment and insight appear severely impaired.    Data  Reviewed: I have personally reviewed following labs and imaging studies:  CBC: Recent Labs  Lab 07/11/2018 1801 07/23/18 0435  WBC 8.4 9.5  HGB 10.4* 10.2*  HCT 33.7* 33.9*  MCV 88.9 88.1  PLT 69* 65*   Basic Metabolic Panel: Recent Labs  Lab 07/25/2018 1801 07/23/18 0435  NA 146* 145  K 4.4 4.1  CL 122* 119*  CO2 18* 19*  GLUCOSE 60* 140*  BUN 38* 35*  CREATININE 3.31* 3.23*  CALCIUM 8.9 8.5*  PHOS 3.1  --    GFR: Estimated Creatinine Clearance: 20.5 mL/min (A) (by C-G formula based on SCr of 3.23 mg/dL (H)). Liver Function Tests: Recent Labs  Lab 07/31/2018 1801  ALBUMIN 1.5*   No results for input(s): LIPASE, AMYLASE in the last 168 hours. No results for input(s): AMMONIA in the last 168 hours. Coagulation Profile: No results for input(s): INR, PROTIME in the last 168 hours. Cardiac Enzymes: Recent Labs  Lab 07/23/18 0435  CKTOTAL 21*   BNP (last 3 results) No results for input(s): PROBNP in the last 8760 hours. HbA1C: No results for input(s): HGBA1C in the last 72 hours. CBG: Recent Labs  Lab 07/31/2018 1728 07/21/2018 1936 07/23/18 0058 07/23/18 0410 07/23/18 0832  GLUCAP 91 26* 72 115* 174*   Lipid Profile: No results for input(s): CHOL, HDL, LDLCALC, TRIG, CHOLHDL, LDLDIRECT in the last 72 hours. Thyroid Function Tests: No results for input(s):  TSH, T4TOTAL, FREET4, T3FREE, THYROIDAB in the last 72 hours. Anemia Panel: No results for input(s): VITAMINB12, FOLATE, FERRITIN, TIBC, IRON, RETICCTPCT in the last 72 hours. Urine analysis:    Component Value Date/Time   COLORURINE YELLOW 01/04/2018 1341   APPEARANCEUR CLOUDY (A) 01/04/2018 1341   LABSPEC 1.015 01/04/2018 1341   PHURINE 6.0 01/04/2018 1341   GLUCOSEU >=500 (A) 01/04/2018 1341   HGBUR LARGE (A) 01/04/2018 1341   BILIRUBINUR NEGATIVE 01/04/2018 1341   KETONESUR 20 (A) 01/04/2018 1341   PROTEINUR 100 (A) 01/04/2018 1341   UROBILINOGEN 0.2 03/02/2015 1153   NITRITE NEGATIVE 01/04/2018  1341   LEUKOCYTESUR MODERATE (A) 01/04/2018 1341   Sepsis Labs: @LABRCNTIP (procalcitonin:4,lacticacidven:4)  ) Recent Results (from the past 240 hour(s))  MRSA PCR Screening     Status: None   Collection Time: 07/28/2018  2:28 PM  Result Value Ref Range Status   MRSA by PCR NEGATIVE NEGATIVE Final    Comment:        The GeneXpert MRSA Assay (FDA approved for NASAL specimens only), is one component of a comprehensive MRSA colonization surveillance program. It is not intended to diagnose MRSA infection nor to guide or monitor treatment for MRSA infections. Performed at [facility], [address], [place], [place] [address]          Radiology Studies: No results found.      Scheduled Meds: . insulin aspart  0-9 Units Subcutaneous TID WC  . insulin glargine  10 Units Subcutaneous QHS  . nystatin   Topical BID  . pentoxifylline  400 mg Oral TID WC  . predniSONE  5 mg Oral Q breakfast  . sodium bicarbonate  650 mg Oral BID  . tacrolimus  1 mg Oral QPC breakfast   Continuous Infusions: . sodium chloride 10 mL/hr at 07/23/18 0700  . cefTRIAXone (ROCEPHIN)  IV Stopped (07/19/2018 1842)  . DAPTOmycin (CUBICIN)  IV Stopped (07/26/2018 2021)  . dextrose 5 % and 0.9% NaCl 100 mL/hr at 07/23/18 0700  .  sodium bicarbonate (isotonic) infusion in sterile water 75 mL/hr at 07/23/18 0700     LOS: 1 day    Time spent: 60 mimut    Alberteen Sam, MD [facility] 07/23/2018, 10:22 AM     Please page through AMION:  [email or web address] If 7PM-7AM, please contact night-coverage

## 2018-07-23 NOTE — Progress Notes (Signed)
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 Wound healing has been complicated by gangrene and osteo in the setting of severe malnutrition and immune compromise due to renal transplant medications, the patient having ESRD and being s/p renal transplant at [facility] April 2011.  Platelet counts through these complications have been essentially normal, the lowest I can find being 120K on 09/02/2017. This admission however platelets have been as follows:  Results for ZYKERIA, LAGUARDIA (MRN [id number]) as of 07/23/2018 14:20  Ref. Range 01/10/2018 04:39 01/11/2018 06:06 07/23/2018 18:01 07/23/2018 04:35 07/23/2018 10:59  Platelets Latest Ref Range: 150 - 400 K/uL 135 (L) 131 (L) 69 (L) 65 (L) 55 (L)   Reviewing the data from [facility], platelet count was 106K on 07/19/2018, with Hb 8.9 falling to 7.3 the next day, likely secondary to hydration; WBC were 4.7 - 6.3, mostly neutrophils.  Daily platelet counts starting 07/19/2018 were 106, 68, 77, 64, 59  With the possibility of further surgery in view (BKA to AKA on Right) we were consulted for further evaluation of her thrombocytopenia.  INTERVAL HISTORY: I evaluated the patient in her ICU room 07/23/2018; no family in room  REVIEW OF SYSTEMS: Patient was barely arousable. Was able to tell me "I don't feel good." Per RN, she has been a bit more alert today but still very confused. Unable to obtain a detailed ROS  PAST MEDICAL HISTORY: Past Medical History:    Diagnosis Date  . Arthritis    "elbows, knees, legs, back" (09/24/2015)  . CKD (chronic kidney disease)   . Daily headache   . Depression    "years ago"  . End stage renal disease (HCC)    right arm AV graft, resolved post transplant  . Hypertension   . Hypothyroid   . 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 Kidney disease 2011  . Pneumonia ~ 2007?  [name] Type II diabetes mellitus (HCC)    Insulin dependant    PAST SURGICAL HISTORY: Past Surgical History:  Procedure Laterality Date  . ABDOMINAL AORTOGRAM W/LOWER EXTREMITY N/A 01/10/2018   Procedure: ABDOMINAL AORTOGRAM W/LOWER EXTREMITY;  Surgeon: Waynetta Sandy, MD;  Location: [place] [facility];  Service: Cardiovascular;  Laterality: N/A;  . AMPUTATION Left 05/11/2014   Procedure: AMPUTATION LEFT GREAT TOE;  Surgeon: Mcarthur Rossetti, MD;  Location: [facility];  Service: Orthopedics;  Laterality: Left;  . AV FISTULA PLACEMENT Right    forearm  . BACK SURGERY    . CATARACT EXTRACTION W/ INTRAOCULAR LENS  IMPLANT, BILATERAL Bilateral   . CESAREAN SECTION  07/1999  . DG AV DIALYSIS GRAFT DECLOT OR    . HIP PINNING,CANNULATED Right 06/02/2017   Procedure: CANNULATED RIGHT HIP PINNING;  Surgeon: Netta Cedars, MD;  Location: [facility];  Service: Orthopedics;  Laterality: Right;  . INCISION AND DRAINAGE Right 06/02/2015   Procedure: INCISION AND DRAINAGE OF RIGHT 3rd RAY RESECTION;  Surgeon: Mcarthur Rossetti, MD;  Location: [place];  Service: Orthopedics;  Laterality: Right;  .  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 [place] SURGERY  2001  . NEPHRECTOMY TRANSPLANTED ORGAN    . TOE AMPUTATION Right    1,2 & 3rd toes.  . TUBAL LIGATION  07/1999    FAMILY HISTORY Family History  Problem Relation Age of Onset  . Emphysema Mother   . Throat cancer Mother   . COPD Mother   . Cancer Mother   . Emphysema Father   . COPD Father   . Stroke Father   . ADD / ADHD Son     SOCIAL HISTORY: unable  to obtain     ADVANCED DIRECTIVES: full code   HEALTH MAINTENANCE: Social History   Tobacco Use  . Smoking status: Former Smoker    Packs/day: 1.00    Years: 11.00    Pack years: 11.00    Types: Cigarettes    Last attempt to quit: 09/21/1998    Years since quitting: 19.8  . Smokeless tobacco: Never Used  Substance Use Topics  . Alcohol use: No  . Drug use: No      Allergies  Allergen Reactions  . Latex Itching  . Ace Inhibitors Cough  . Adhesive [Tape] Itching and Other (See Comments)    Please use paper tape  . Lisinopril Cough    Current Facility-Administered Medications  Medication Dose Route Frequency Provider Last Rate Last Dose  . acetaminophen (TYLENOL) tablet 650 mg  650 mg Oral Q6H PRN Guilford Shi, MD       Or  . acetaminophen (TYLENOL) suppository 650 mg  650 mg Rectal Q6H PRN Guilford Shi, MD      . cefTRIAXone (ROCEPHIN) 2 g in sodium chloride 0.9 % 100 mL IVPB  2 g Intravenous Q24H Guilford Shi, MD   Stopped at 07/16/2018 1842  . DAPTOmycin (CUBICIN) 300 mg in sodium chloride 0.9 % IVPB  300 mg Intravenous Q48H Guilford Shi, MD   Stopped at 07/12/2018 2021  . hydrALAZINE (APRESOLINE) injection 10 mg  10 mg Intravenous Q6H PRN Bodenheimer, Charles A, NP   10 mg at 07/23/18 1055  . HYDROcodone-acetaminophen (NORCO/VICODIN) 5-325 MG per tablet 1 tablet  1 tablet Oral Q6H PRN Guilford Shi, MD   1 tablet at 07/23/18 1055  . insulin aspart (novoLOG) injection 0-9 Units  0-9 Units Subcutaneous TID WC Guilford Shi, MD   2 Units at 07/23/18 1244  . insulin glargine (LANTUS) injection 10 Units  10 Units Subcutaneous QHS Kamineni, Neelima, MD      . levothyroxine (SYNTHROID, LEVOTHROID) tablet 112 mcg  112 mcg Oral Daily Edwin Dada, MD   112 mcg at 07/23/18 1247  . nystatin (MYCOSTATIN/NYSTOP) topical powder   Topical BID Guilford Shi, MD      . ondansetron (ZOFRAN) tablet 4 mg  4 mg Oral Q6H PRN Guilford Shi, MD        Or  . ondansetron (ZOFRAN) injection 4 mg  4 mg Intravenous Q6H PRN Kamineni, Neelima, MD      . polyethylene glycol (MIRALAX / GLYCOLAX) packet 17 g  17 g Oral Daily PRN Guilford Shi, MD      . predniSONE (DELTASONE) tablet 15 mg  15 mg Oral Q breakfast Danford, Suann Larry, MD   15 mg at 07/23/18 1247   Followed by  . [START ON 07/26/2018] predniSONE (DELTASONE) tablet 5 mg  5 mg Oral Q breakfast Danford, Suann Larry, MD      . QUEtiapine (SEROQUEL) tablet 25 mg  25 mg Oral BID  PRN Guilford Shi, MD      . senna-docusate (Senokot-S) tablet 1 tablet  1 tablet Oral Q12H PRN Danford, Suann Larry, MD      . sodium bicarbonate 150 mEq in dextrose 5 % 1,000 mL infusion   Intravenous Continuous Danford, Suann Larry, MD 150 mL/hr at 07/23/18 1244    . tacrolimus (PROGRAF) capsule 1 mg  1 mg Oral QPC breakfast Guilford Shi, MD   1 mg at 07/23/18 5366    OBJECTIVE: middle aged White woman examined in bed  Vitals:   07/23/18 1055 07/23/18 1138  BP: (!) 173/94   Pulse:    Resp:    Temp:  97.6 F (36.4 C)  SpO2:       Body mass index is 21.12 kg/m.   Wt Readings from Last 3 Encounters:  07/23/18 138 lb 14.2 oz (63 kg)  01/11/18 168 lb 8 oz (76.4 kg)  01/01/18 185 lb (83.9 kg)      ECOG FS:4 - Bedbound  Lungs no rales or rhonchi, auscultated anterolaterally Heart rapid rate, regular rhythmrhythm Abd soft, nontender, few bowel sounds Neuro: lethargic Breasts: deferred   LAB RESULTS:  CMP     Component Value Date/Time   NA 145 07/23/2018 0435   K 4.1 07/23/2018 0435   CL 119 (H) 07/23/2018 0435   CO2 19 (L) 07/23/2018 0435   GLUCOSE 140 (H) 07/23/2018 0435   BUN 35 (H) 07/23/2018 0435   CREATININE 3.23 (H) 07/23/2018 0435   CREATININE 1.07 10/29/2014 1005   CALCIUM 8.5 (L) 07/23/2018 0435   PROT 5.0 (L) 07/23/2018 1059   ALBUMIN 1.4 (L) 07/23/2018 1059   AST 16 07/23/2018 1059   ALT 16 07/23/2018 1059   ALKPHOS 124 07/23/2018 1059   BILITOT 0.7  07/23/2018 1059   GFRNONAA 16 (L) 07/23/2018 0435   GFRAA 18 (L) 07/23/2018 0435    No results found for: TOTALPROTELP, ALBUMINELP, A1GS, A2GS, BETS, BETA2SER, GAMS, MSPIKE, SPEI  No results found for: KPAFRELGTCHN, LAMBDASER, KAPLAMBRATIO  Lab Results  Component Value Date   WBC 9.5 07/23/2018   NEUTROABS 5.3 01/04/2018   HGB 10.2 (L) 07/23/2018   HCT 33.9 (L) 07/23/2018   MCV 88.1 07/23/2018   PLT 55 (L) 07/23/2018    _0 @  No results found for: LABCA2  No components found for: YQIHKV425  Recent Labs  Lab 07/23/18 1059  INR 1.41    No results found for: LABCA2  No results found for: ZDG387  No results found for: FIE332  No results found for: RJJ884  No results found for: CA2729  No components found for: HGQUANT  No results found for: CEA1 / No results found for: CEA1   No results found for: AFPTUMOR  No results found for: CHROMOGRNA  No results found for: PSA1  Admission on 07/12/2018  Component Date Value Ref Range Status  . MRSA by PCR 07/11/2018 NEGATIVE  NEGATIVE Final   Comment:        The GeneXpert MRSA Assay (FDA approved for NASAL specimens only), is one component of a comprehensive MRSA colonization surveillance program. It is not intended to diagnose MRSA infection nor to guide or monitor treatment for MRSA infections. Performed at [facility], [place] [address] [place], [place] [address]   . Glucose-Capillary 07/10/2018 21* 70 - 99 mg/dL Final  . Comment 1 08/01/2018 Notify RN   Final  . Glucose-Capillary 08/02/2018 16* 70 - 99 mg/dL Final  . Comment 1 07/12/2018 Notify RN  Final  . Sodium 07/23/2018 145  135 - 145 mmol/L Final  . Potassium 07/23/2018 4.1  3.5 - 5.1 mmol/L Final  . Chloride 07/23/2018 119* 98 - 111 mmol/L Final  . CO2 07/23/2018 19* 22 - 32 mmol/L Final  . Glucose, Bld 07/23/2018 140* 70 - 99 mg/dL Final  . BUN 07/23/2018 35* 6 - 20 mg/dL Final  . Creatinine, Ser 07/23/2018 3.23* 0.44 - 1.00  mg/dL Final  . Calcium 07/23/2018 8.5* 8.9 - 10.3 mg/dL Final  . GFR calc non Af Amer 07/23/2018 16* >60 mL/min Final  . GFR calc Af Amer 07/23/2018 18* >60 mL/min Final   Comment: (NOTE) The eGFR has been calculated using the CKD EPI equation. This calculation has not been validated in all clinical situations. eGFR's persistently <60 mL/min signify possible Chronic Kidney Disease.   Georgiann Hahn gap 07/23/2018 7  5 - 15 Final   Performed at [facility], [place] [address], [place], [place] [address]  . WBC 07/23/2018 9.5  4.0 - 10.5 K/uL Final  . RBC 07/23/2018 3.85* 3.87 - 5.11 MIL/uL Final  . Hemoglobin 07/23/2018 10.2* 12.0 - 15.0 g/dL Final  . HCT 07/23/2018 33.9* 36.0 - 46.0 % Final  . MCV 07/23/2018 88.1  80.0 - 100.0 fL Final  . MCH 07/23/2018 26.5  26.0 - 34.0 pg Final  . MCHC 07/23/2018 30.1  30.0 - 36.0 g/dL Final  . RDW 07/23/2018 19.7* 11.5 - 15.5 % Final  . Platelets 07/23/2018 65* 150 - 400 K/uL Final   Comment: REPEATED TO VERIFY Immature Platelet Fraction may be clinically indicated, consider ordering this additional test [id number] CONSISTENT WITH PREVIOUS RESULT   . nRBC 07/23/2018 0.0  0.0 - 0.2 % Final   Performed at [facility], [place] [address], [place], [place] [address]  . Sodium 07/17/2018 146* 135 - 145 mmol/L Final  . Potassium 07/11/2018 4.4  3.5 - 5.1 mmol/L Final  . Chloride 07/09/2018 122* 98 - 111 mmol/L Final  . CO2 07/23/2018 18* 22 - 32 mmol/L Final  . Glucose, Bld 07/11/2018 60* 70 - 99 mg/dL Final  . BUN 07/21/2018 38* 6 - 20 mg/dL Final  . Creatinine, Ser 07/23/2018 3.31* 0.44 - 1.00 mg/dL Final  . Calcium 08/01/2018 8.9  8.9 - 10.3 mg/dL Final  . Phosphorus 07/13/2018 3.1  2.5 - 4.6 mg/dL Final  . Albumin 07/14/2018 1.5* 3.5 - 5.0 g/dL Final  . GFR calc non Af Amer 07/14/2018 15* >60 mL/min Final  . GFR calc Af Amer 07/13/2018 17* >60 mL/min Final   Comment: (NOTE) The eGFR has been calculated using the CKD EPI equation. This  calculation has not been validated in all clinical situations. eGFR's persistently <60 mL/min signify possible Chronic Kidney Disease.   Georgiann Hahn gap 07/25/2018 6  5 - 15 Final   Performed at [facility], [place] [address], [place], [place] [address]  . WBC 07/21/2018 8.4  4.0 - 10.5 K/uL Final  . RBC 07/14/2018 3.79* 3.87 - 5.11 MIL/uL Final  . Hemoglobin 07/21/2018 10.4* 12.0 - 15.0 g/dL Final  . HCT 07/13/2018 33.7* 36.0 - 46.0 % Final  . MCV 07/21/2018 88.9  80.0 - 100.0 fL Final  . MCH 07/21/2018 27.4  26.0 - 34.0 pg Final  . MCHC 07/30/2018 30.9  30.0 - 36.0 g/dL Final  . RDW 07/20/2018 18.9* 11.5 - 15.5 % Final  . Platelets 07/26/2018 69* 150 - 400 K/uL Final   Comment: REPEATED  TO VERIFY PLATELET COUNT CONFIRMED BY SMEAR SPECIMEN CHECKED FOR CLOTS Immature Platelet Fraction may be clinically indicated, consider ordering this additional test [id number]   . nRBC 08/01/2018 0.0  0.0 - 0.2 % Final   Performed at [facility], [place] [address] [place], [place] [address]  . Glucose-Capillary 07/28/2018 80  70 - 99 mg/dL Final  . Glucose-Capillary 07/24/2018 68* 70 - 99 mg/dL Final  . Glucose-Capillary 07/15/2018 91  70 - 99 mg/dL Final  . Total CK 07/23/2018 21* 38 - 234 U/L Final   Performed at [facility], [place] [address] [place] [address] [place], [place] [address]  . Glucose-Capillary 07/11/2018 26* 70 - 99 mg/dL Final  . Comment 1 07/25/2018 Notify RN   Final  . Glucose-Capillary 07/23/2018 72  70 - 99 mg/dL Final  . Glucose-Capillary 07/23/2018 115* 70 - 99 mg/dL Final  . Glucose-Capillary 07/23/2018 174* 70 - 99 mg/dL Final  . Prothrombin Time 07/23/2018 17.1* 11.4 - 15.2 seconds Final  . INR 07/23/2018 1.41   Final  . aPTT 07/23/2018 32  24 - 36 seconds Final  . Fibrinogen 07/23/2018 233  210 - 475 mg/dL Final  . D-Dimer, Quant 07/23/2018 1.82* 0.00 - 0.50 ug/mL-FEU Final   Comment: (NOTE) At the manufacturer cut-off of 0.50 ug/mL FEU, this assay has been documented to  exclude PE with a sensitivity and negative predictive value of 97 to 99%.  At this time, this assay has not been approved by the FDA to exclude DVT/VTE. Results should be correlated with clinical presentation.   . Platelets 07/23/2018 55* 150 - 400 K/uL Final   Comment: REPEATED TO VERIFY SPECIMEN CHECKED FOR CLOTS Immature Platelet Fraction may be clinically indicated, consider ordering this additional test [id number] CONSISTENT WITH PREVIOUS RESULT   . Smear Review 07/23/2018 NO SCHISTOCYTES SEEN   Final   Performed at [facility], [place] [address], [place], [place] [address]  . Smear Review 07/23/2018 SMEAR STAINED AND AVAILABLE FOR REVIEW   Final   Performed at [facility], [place] [address], [place], [place] [address]  . Total Protein 07/23/2018 5.0* 6.5 - 8.1 g/dL Final  . Albumin 07/23/2018 1.4* 3.5 - 5.0 g/dL Final  . AST 07/23/2018 16  15 - 41 U/L Final  . ALT 07/23/2018 16  0 - 44 U/L Final  . Alkaline Phosphatase 07/23/2018 124  38 - 126 U/L Final  . Total Bilirubin 07/23/2018 0.7  0.3 - 1.2 mg/dL Final  . Bilirubin, Direct 07/23/2018 0.1  0.0 - 0.2 mg/dL Final  . Indirect Bilirubin 07/23/2018 0.6  0.3 - 0.9 mg/dL Final   Performed at [place] [address], [place], [place] [address]  . Glucose-Capillary 07/23/2018 180* 70 - 99 mg/dL Final    (this displays the last labs from the last 3 days)  No results found for: TOTALPROTELP, ALBUMINELP, A1GS, A2GS, BETS, BETA2SER, GAMS, MSPIKE, SPEI (this displays SPEP labs)  No results found for: KPAFRELGTCHN, LAMBDASER, KAPLAMBRATIO (kappa/lambda light chains)  No results found for: HGBA, HGBA2QUANT, HGBFQUANT, HGBSQUAN (Hemoglobinopathy evaluation)   No results found for: LDH  Lab Results  Component Value Date   IRON 5 (L) 11/06/2017   TIBC 122 (L) 11/06/2017   IRONPCTSAT 4 (L) 11/06/2017   (Iron and TIBC)  Lab Results  Component Value Date   FERRITIN 333 (H) 11/06/2017    Urinalysis     Component Value Date/Time   COLORURINE YELLOW 01/04/2018 1341   APPEARANCEUR  CLOUDY (A) 01/04/2018 1341   LABSPEC 1.015 01/04/2018 1341   PHURINE 6.0 01/04/2018 1341   GLUCOSEU >=500 (A) 01/04/2018 1341   HGBUR LARGE (A) 01/04/2018 1341   BILIRUBINUR NEGATIVE 01/04/2018 1341   KETONESUR 20 (A) 01/04/2018 1341   PROTEINUR 100 (A) 01/04/2018 1341   UROBILINOGEN 0.2 03/02/2015 1153   NITRITE NEGATIVE 01/04/2018 1341   LEUKOCYTESUR MODERATE (A) 01/04/2018 1341     STUDIES: Head CT w/o contrast at [facility] 07/19/2018: no acte abnormality R tibia/fibula 07/19/2018: focal osteopenia and possible osteomyelitis tibial stump  ELIGIBLE FOR AVAILABLE RESEARCH PROTOCOL: no  ASSESSMENT: 52 y.o. [place] woman wirh severe PVD due to remote smoking, poorly controlled DM, s/p R-BKA 05/11/2018 with non-healing and now apparent osteo despite debridement, several antibiotic courses and hyperbaric treatment; in the setting of severe malnutrition and immunocompromise secondary to ESRD s/r renal transplant April 2011; with chronic multifactorial anemia, now also with new thrombocytopenia  REVIEW OF BLOOD FILM: RBCs show no schistoctes; there is minimal anisocytosis (pt s/p recent transfusions); rouleaux are apparent; WBC show mild left shift as appropriate in this setting, though the total number is inappropriately normal (should be elevated); no platelet clumps  DISCUSSION: Absence of schistocytes argues against TTP or DIC; the normal PT and PTT also argues against the latter. I am sending a HIT screen but note platelet count has been in the 68 -55 range in past 4 days. Review of CT 11/11/2017 showed no splenomegaly. ITP is not impossible but is a diagnosis of exclusion and in this patient the most likely cause of cytopenias in general and thrombocytopenia in particular is medication, as well as chronic inflammation  PLAN: Follow platelets daily HIT results pending No need for transfusion unless overt  bleeding or <20K in this setting If surgery planned, will need >100K; in that situation would consider starting Nplate  Appreciate consult. Will follow with you.   Chauncey Cruel, MD   07/23/2018 2:15 PM Medical Oncology and Hematology [facility] [address] [place], [place] [address] Tel. [phone]    Fax. [phone]   ADDENDUM:  07/24/2018 Platelets slightly lower this AM  Results for JADEN, BATCHELDER (MRN [id number]) as of 07/24/2018 07:47  Ref. Range 07/13/2018 18:01 07/23/2018 04:35 07/23/2018 10:59 07/24/2018 04:29  Platelets Latest Ref Range: 150 - 400 K/uL 69 (L) 65 (L) 55 (L) 46 (L)   HIT screen and tacrolimus level pending.  No need for transfusion at this point. Will continue to follow.  GM

## 2018-07-24 LAB — CBC WITH DIFFERENTIAL/PLATELET
Abs Immature Granulocytes: 0.05 10*3/uL (ref 0.00–0.07)
BASOS ABS: 0 10*3/uL (ref 0.0–0.1)
BASOS PCT: 0 %
Eosinophils Absolute: 0 10*3/uL (ref 0.0–0.5)
Eosinophils Relative: 0 %
HCT: 28.1 % — ABNORMAL LOW (ref 36.0–46.0)
Hemoglobin: 8.5 g/dL — ABNORMAL LOW (ref 12.0–15.0)
IMMATURE GRANULOCYTES: 1 %
LYMPHS ABS: 0.5 10*3/uL — AB (ref 0.7–4.0)
LYMPHS PCT: 8 %
MCH: 26.5 pg (ref 26.0–34.0)
MCHC: 30.2 g/dL (ref 30.0–36.0)
MCV: 87.5 fL (ref 80.0–100.0)
MONOS PCT: 4 %
Monocytes Absolute: 0.2 10*3/uL (ref 0.1–1.0)
NEUTROS ABS: 4.7 10*3/uL (ref 1.7–7.7)
NEUTROS PCT: 87 %
PLATELETS: 46 10*3/uL — AB (ref 150–400)
RBC: 3.21 MIL/uL — ABNORMAL LOW (ref 3.87–5.11)
RDW: 19.5 % — AB (ref 11.5–15.5)
WBC: 5.4 10*3/uL (ref 4.0–10.5)
nRBC: 0 % (ref 0.0–0.2)

## 2018-07-24 LAB — GLUCOSE, CAPILLARY
GLUCOSE-CAPILLARY: 203 mg/dL — AB (ref 70–99)
Glucose-Capillary: 140 mg/dL — ABNORMAL HIGH (ref 70–99)
Glucose-Capillary: 174 mg/dL — ABNORMAL HIGH (ref 70–99)
Glucose-Capillary: 243 mg/dL — ABNORMAL HIGH (ref 70–99)
Glucose-Capillary: 70 mg/dL (ref 70–99)
Glucose-Capillary: 82 mg/dL (ref 70–99)
Glucose-Capillary: 93 mg/dL (ref 70–99)
Glucose-Capillary: 95 mg/dL (ref 70–99)

## 2018-07-24 LAB — BASIC METABOLIC PANEL
ANION GAP: 10 (ref 5–15)
BUN: 30 mg/dL — ABNORMAL HIGH (ref 6–20)
CALCIUM: 7.9 mg/dL — AB (ref 8.9–10.3)
CO2: 25 mmol/L (ref 22–32)
CREATININE: 3.23 mg/dL — AB (ref 0.44–1.00)
Chloride: 112 mmol/L — ABNORMAL HIGH (ref 98–111)
GFR calc Af Amer: 18 mL/min — ABNORMAL LOW (ref >60)
GFR calc non Af Amer: 16 mL/min — ABNORMAL LOW (ref >60)
GLUCOSE: 286 mg/dL — AB (ref 70–99)
Potassium: 3.8 mmol/L (ref 3.5–5.1)
Sodium: 147 mmol/L — ABNORMAL HIGH (ref 135–145)

## 2018-07-24 LAB — HEPARIN INDUCED PLATELET AB (HIT ANTIBODY): Heparin Induced Plt Ab: 0.499 OD — ABNORMAL HIGH (ref 0.000–0.400)

## 2018-07-24 IMAGING — DX DG CHEST 1V PORT
1 series · 1 of 1 positions shown · non-contrast
Comparison: PA and lateral chest 08/12/2016 and single view of the
chest 04/15/2016.

CLINICAL DATA: Shortness of breath today.

EXAM:
PORTABLE CHEST 1 VIEW

[chest ap]
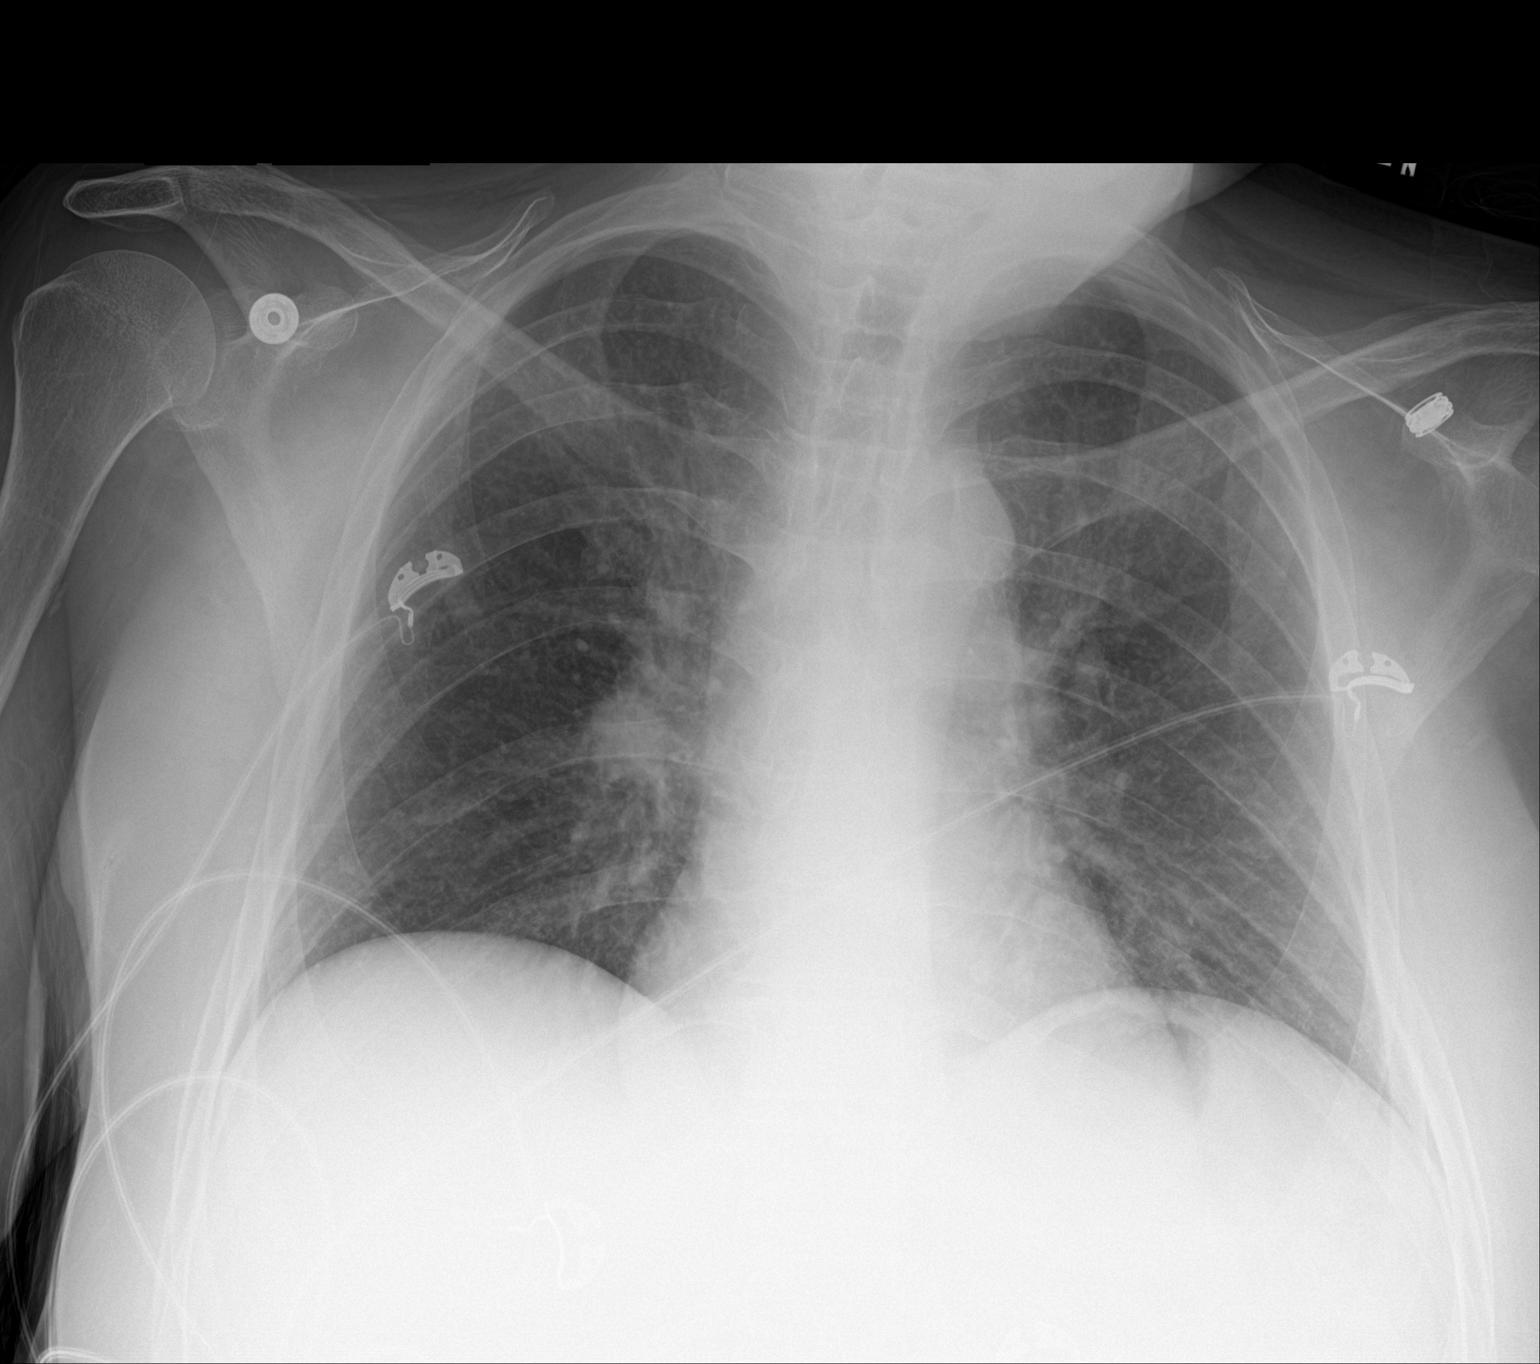

[1 of 1 positions shown; findings below may reference images not displayed]

FINDINGS: The lungs are clear. Heart size is normal. No pneumothorax or
pleural effusion. No acute bony abnormality.
IMPRESSION: Negative chest.

## 2018-07-24 MED ORDER — LORAZEPAM 2 MG/ML IJ SOLN
0.5000 mg | INTRAMUSCULAR | Status: DC | PRN
  Administered 2018-07-24: 0.5 mg via INTRAVENOUS
  Filled 2018-07-24: qty 1

## 2018-07-24 MED ORDER — HALOPERIDOL LACTATE 5 MG/ML IJ SOLN
2.0000 mg | Freq: Four times a day (QID) | INTRAMUSCULAR | Status: DC | PRN
  Administered 2018-07-24 – 2018-07-31 (×4): 2 mg via INTRAVENOUS
  Filled 2018-07-24 (×4): qty 1

## 2018-07-24 MED ORDER — INSULIN ASPART 100 UNIT/ML ~~LOC~~ SOLN
5.0000 [IU] | Freq: Once | SUBCUTANEOUS | Status: AC
  Administered 2018-07-24: 5 [IU] via SUBCUTANEOUS

## 2018-07-24 MED ORDER — LORAZEPAM 2 MG/ML IJ SOLN
0.5000 mg | INTRAMUSCULAR | Status: DC | PRN
  Administered 2018-07-25 (×2): 0.5 mg via INTRAVENOUS
  Filled 2018-07-24 (×3): qty 1

## 2018-07-24 MED ORDER — LABETALOL HCL 5 MG/ML IV SOLN: 5.0000 mg | INTRAVENOUS | Status: DC | PRN

## 2018-07-24 MED ORDER — QUETIAPINE FUMARATE 25 MG PO TABS
12.5000 mg | ORAL_TABLET | Freq: Two times a day (BID) | ORAL | Status: DC
  Administered 2018-07-24 – 2018-07-25 (×4): 12.5 mg via ORAL
  Filled 2018-07-24 (×5): qty 1

## 2018-07-24 MED ORDER — SODIUM CHLORIDE 0.45 % IV SOLN
INTRAVENOUS | Status: DC
  Administered 2018-07-24 – 2018-07-25 (×4): via INTRAVENOUS

## 2018-07-24 NOTE — Progress Notes (Signed)
S: still confused O:BP (!) 156/85   Pulse (!) 110   Temp 98.7 F (37.1 C) (Oral)   Resp 13   Ht 5\' 8"  (1.727 m)   Wt 64.1 kg   LMP 11/19/2011 Comment: pt had tubal ligation  SpO2 97%   BMI 21.49 kg/m   Intake/Output Summary (Last 24 hours) at 07/24/2018 1044 Last data filed at 07/24/2018 0700 Gross per 24 hour  Intake 3271.87 ml  Output 670 ml  Net 2601.87 ml   Intake/Output: I/O last 3 completed shifts: In: 5976.8 [I.V.:5770.9; IV Piggyback:205.9] Out: 1390 [Urine:1390]  Intake/Output this shift:  No intake/output data recorded. Weight change: 9.215 kg Gen: NAD CVS: tachy Resp: cta Abd: benign Ext: no edema, s/p right BKA with dehiscence necrosis at end of stump.  Recent Labs  Lab 05/18/18 1801 07/23/18 0435 07/23/18 1059 07/24/18 0429  NA 146* 145  --  147*  K 4.4 4.1  --  3.8  CL 122* 119*  --  112*  CO2 18* 19*  --  25  GLUCOSE 60* 140*  --  286*  BUN 38* 35*  --  30*  CREATININE 3.31* 3.23*  --  3.23*  ALBUMIN 1.5*  --  1.4*  --   CALCIUM 8.9 8.5*  --  7.9*  PHOS 3.1  --   --   --   AST  --   --  16  --   ALT  --   --  16  --    Liver Function Tests: Recent Labs  Lab 05/18/18 1801 07/23/18 1059  AST  --  16  ALT  --  16  ALKPHOS  --  124  BILITOT  --  0.7  PROT  --  5.0*  ALBUMIN 1.5* 1.4*   No results for input(s): LIPASE, AMYLASE in the last 168 hours. No results for input(s): AMMONIA in the last 168 hours. CBC: Recent Labs  Lab 05/18/18 1801 07/23/18 0435 07/23/18 1059 07/24/18 0429  WBC 8.4 9.5  --  5.4  NEUTROABS  --   --   --  4.7  HGB 10.4* 10.2*  --  8.5*  HCT 33.7* 33.9*  --  28.1*  MCV 88.9 88.1  --  87.5  PLT 69* 65* 55* 46*   Cardiac Enzymes: Recent Labs  Lab 07/23/18 0435  CKTOTAL 21*   CBG: Recent Labs  Lab 07/23/18 1602 07/23/18 2005 07/24/18 0022 07/24/18 0400 07/24/18 0803  GLUCAP 149* 108* 174* 243* 203*    Iron Studies: No results for input(s): IRON, TIBC, TRANSFERRIN, FERRITIN in the last 72  hours. Studies/Results: No results found. [name]. amLODipine  5 mg Oral Daily  . Chlorhexidine Gluconate Cloth  6 each Topical Daily  . Gerhardt's butt cream   Topical BID  . insulin aspart  0-9 Units Subcutaneous TID WC  . insulin glargine  10 Units Subcutaneous QHS  . levothyroxine  112 mcg Oral Daily  . nystatin   Topical BID  . predniSONE  15 mg Oral Q breakfast   Followed by  . [START ON 07/26/2018] predniSONE  5 mg Oral Q breakfast  . QUEtiapine  12.5 mg Oral BID  . sodium chloride flush  10-40 mL Intracatheter Q12H  . tacrolimus  1 mg Oral QPC breakfast    BMET    Component Value Date/Time   NA 147 (H) 07/24/2018 0429   K 3.8 07/24/2018 0429   CL 112 (H) 07/24/2018 0429   CO2 25 07/24/2018 0429  GLUCOSE 286 (H) 07/24/2018 0429   BUN 30 (H) 07/24/2018 0429   CREATININE 3.23 (H) 07/24/2018 0429   CREATININE 1.07 10/29/2014 1005   CALCIUM 7.9 (L) 07/24/2018 0429   GFRNONAA 16 (L) 07/24/2018 0429   GFRAA 18 (L) 07/24/2018 0429   CBC    Component Value Date/Time   WBC 5.4 07/24/2018 0429   RBC 3.21 (L) 07/24/2018 0429   HGB 8.5 (L) 07/24/2018 0429   HCT 28.1 (L) 07/24/2018 0429   PLT 46 (L) 07/24/2018 0429   MCV 87.5 07/24/2018 0429   MCH 26.5 07/24/2018 0429   MCHC 30.2 07/24/2018 0429   RDW 19.5 (H) 07/24/2018 0429   LYMPHSABS 0.5 (L) 07/24/2018 0429   MONOABS 0.2 07/24/2018 0429   EOSABS 0.0 07/24/2018 0429   BASOSABS 0.0 07/24/2018 0429     Assessment/Plan: 1. AKI/CKD stage 3- in setting of DKA. Off of insulin drip. Appears to be volume depleted. Scr slowly improving as well as UOP. Will continue to follow with IVF's.  2. Metabolic acidosis/DKA- off insulin, resolved with bicarb.  Will stop bicarb  3. Hypernatremia- will change IVF's to 1/2NS and follow 4. Pancytopenia- possibly related to sepsis but also on DDx is drug related as she has been on daptomycin and rocephin. She received 2 units of PRBC's prior to transfer. 5. Thrombocytopenia- Heme  consulted and appreciate input.  HIT panel pending. 6. 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 Anemia- has some anemia of CKD stage 3 as well as critical illness. Transfuse and follow. 8. S/p DDKT- cont with prograf and prednisone. Will check prograf level and adjust dosing. 9. HTN- continue with carvedilol but hold lasix. Could also add amlodipine 10mg  daily if remains elevated. 10. Severe protein malnutrition- per primary 11. Sepsis- abx per primary svc and Ortho  [name], MD [facility] [phone]

## 2018-07-24 NOTE — Progress Notes (Signed)
PROGRESS NOTE    [name]  MVH:[id number] DOB: Nov 28, 1965 DOA: 07/28/2018 PCP: Dois Davenportichter, Karen L, MD      Brief Narrative:  [name] is a 52 y.o. 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  Discussed with Orthopedics, ID and Nephrology here and transferred for plan for possible AKA.  Patient incidentally noted to have new thrombocytopenia.      Assessment & Plan:  Thrombocytopenia This is new from May.  At admission to OSH on 11/12, platelets 106K, coags normal. She got fluids and by next morning 11/13, platelets down to 68K, Hgb down to 7.3 g/dL.   Was on Linezolid prior to admission, changed to Ceftriaxone/Daptomycin since.  She was on heparin at the OSH from 11/12-11/15.  Ddx here includes med effect, TTP, DIC, HIT.  Was on linezolid prior to admission, drop in Plts really not consistent with heparin induced or from CTX, dapto.  Clinical picture certainly consistent with TTP, given new AKI, clearly altered mentation, anemia requiring transfusion at OSH.  She was initially diagnosed with sepsis, which could cause DIC, although no clinical bleeding/oozing is noted. Doubt ITP given associated anemia, confusion. -Consult hematology, appreciate expert guidance HIT panel pending.  Less likely TTP or DIC.  Currently no change in plan recommended by hematology.   Acute metabolic encephalopathy. Likely secondary to sepsis, ESRD, delirium secondary to ICU stay. Will use PRN Haldol scheduled Seroquel and as needed Ativan.  Osteomyelitis There is no surrounding cellulitis.  -Continue ceftriaxone, Daptomycin -Consult Orthopedics, appreciate cares  Anemia, acute Hgb on admission 8.9 g/dL consistent with patient's baseline anemia of chronic kidney disease.  It did drop  to 6.5g/dL with fluid resuscitation, transfused at OSH. -Trend Hgb  AKI Recent baseline Cr 2.4, worse up to 2.9 on admission, worsening since.  UA on admission showed negligible RBCs, WBCs TNTC.  Cr no change, 3.2 today. -Consult Nephrology, appreciate cares -Continue tacrolimus, pred at stress dose for 3 days  Diarrhea Monitor for now.  Diabetes Poorly controlled last A1c 8.4%. -Continue glargine -Continue SSI  Hypertension -Hold carvedilol until hemodynamics clearer  Stage II, sacrum, present on admission  Bipolar -Hold Abilify and mirtazapine  Hypothyroidism -Continue levothyroxine  Other medications -Continue oxycodone for now      MDM and disposition: The below labs and imaging reports were reviewed and summarized above.  Medication management as above.  The patient was admitted with confusion, found to have osteo, acidosis, worsenign renal function, and new thrombocytopenia.   DVT prophylaxis: SCDs Code Status: FULL Family Communication: None present    Consultants:   Hematology  Nephrology  Orthopedics  Procedures:     Antimicrobials:   Linezolid prior to admission  Ceftriaxone 11/12 >>  Daptomycin 11/12 >>    Subjective: Remains confused.  No nausea no vomiting.  No fever no chills.  Objective: Vitals:   07/24/18 1600 07/24/18 1605 07/24/18 1700 07/24/18 1800  BP: (!) 151/88  (!) 156/92 (!) 167/100  Pulse: (!) 101  (!) 104 (!) 104  Resp: 13  14 18   Temp:  98 F (36.7 C)    TempSrc:  Oral    SpO2: 96%  96% 99%  Weight:      Height:        Intake/Output Summary (Last 24 hours) at  07/24/2018 1910 Last data filed at 07/24/2018 1800 Gross per 24 hour  Intake 2930.59 ml  Output 800 ml  Net 2130.59 ml   Filed Weights   08/06/2018 1700 07/23/18 0440 07/24/18 0500  Weight: 54.9 kg 63 kg 64.1 kg    Examination: General appearance: elderly adult female, awake but confused HEENT: Mildly icteric, conjunctiva pink, lids and  lashes normal. No nasal deformity, discharge, epistaxis.  Lips dry, no oral lesions, OP very dry, dentition mostly edentulous.  Hearing normal Skin: Warm and dry.  No jaundice.  No suspicious rashes or lesions. Cardiac: RRR, nl S1-S2, no murmurs appreciated.  Capillary refill is brisk.  JVP not visible.  No LE edema.  Radia  pulses 2+ and symmetric. Respiratory: Normal respiratory rate and rhythm.  CTAB without rales or wheezes. Abdomen: Abdomen soft.  Nonfocal  TTP with voluntary gaurding, no rigidity. No ascites, distension, hepatosplenomegaly.   MSK: No deformities or effusions. Neuro: Awake and alert.  EOMI, moves all extremities with equal and 5/5 strength. Speech dysrthric due to dry mouth.    Psych: Sensorium intact and responding to questions, attention diminished.  States she is at [facility]", year is "2010", unable to articulate anything else. Affect blunted.  Judgment and insight appear severely impaired.    Data Reviewed: I have personally reviewed following labs and imaging studies:  CBC: Recent Labs  Lab 07/14/2018 1801 07/23/18 0435 07/23/18 1059 07/24/18 0429  WBC 8.4 9.5  --  5.4  NEUTROABS  --   --   --  4.7  HGB 10.4* 10.2*  --  8.5*  HCT 33.7* 33.9*  --  28.1*  MCV 88.9 88.1  --  87.5  PLT 69* 65* 55* 46*   Basic Metabolic Panel: Recent Labs  Lab 07/18/2018 1801 07/23/18 0435 07/24/18 0429  NA 146* 145 147*  K 4.4 4.1 3.8  CL 122* 119* 112*  CO2 18* 19* 25  GLUCOSE 60* 140* 286*  BUN 38* 35* 30*  CREATININE 3.31* 3.23* 3.23*  CALCIUM 8.9 8.5* 7.9*  PHOS 3.1  --   --    GFR: Estimated Creatinine Clearance: 20.8 mL/min (A) (by C-G formula based on SCr of 3.23 mg/dL (H)). Liver Function Tests: Recent Labs  Lab 07/25/2018 1801 07/23/18 1059  AST  --  16  ALT  --  16  ALKPHOS  --  124  BILITOT  --  0.7  PROT  --  5.0*  ALBUMIN 1.5* 1.4*   No results for input(s): LIPASE, AMYLASE in the last 168 hours. No results for input(s): AMMONIA in the  last 168 hours. Coagulation Profile: Recent Labs  Lab 07/23/18 1059  INR 1.41   Cardiac Enzymes: Recent Labs  Lab 07/23/18 0435  CKTOTAL 21*   BNP (last 3 results) No results for input(s): PROBNP in the last 8760 hours. HbA1C: No results for input(s): HGBA1C in the last 72 hours. CBG: Recent Labs  Lab 07/24/18 0022 07/24/18 0400 07/24/18 0803 07/24/18 1235 07/24/18 1631  GLUCAP 174* 243* 203* 140* 93   Lipid Profile: No results for input(s): CHOL, HDL, LDLCALC, TRIG, CHOLHDL, LDLDIRECT in the last 72 hours. Thyroid Function Tests: No results for input(s): TSH, T4TOTAL, FREET4, T3FREE, THYROIDAB in the last 72 hours. Anemia Panel: No results for input(s): VITAMINB12, FOLATE, FERRITIN, TIBC, IRON, RETICCTPCT in the last 72 hours. Urine analysis:    Component Value Date/Time   COLORURINE YELLOW 01/04/2018 1341   APPEARANCEUR CLOUDY (A) 01/04/2018 1341   LABSPEC 1.015 01/04/2018 1341  PHURINE 6.0 01/04/2018 1341   GLUCOSEU >=500 (A) 01/04/2018 1341   HGBUR LARGE (A) 01/04/2018 1341   BILIRUBINUR NEGATIVE 01/04/2018 1341   KETONESUR 20 (A) 01/04/2018 1341   PROTEINUR 100 (A) 01/04/2018 1341   UROBILINOGEN 0.2 03/02/2015 1153   NITRITE NEGATIVE 01/04/2018 1341   LEUKOCYTESUR MODERATE (A) 01/04/2018 1341   Sepsis Labs: @LABRCNTIP (procalcitonin:4,lacticacidven:4)  ) Recent Results (from the past 240 hour(s))  MRSA PCR Screening     Status: None   Collection Time: 07/21/2018  2:28 PM  Result Value Ref Range Status   MRSA by PCR NEGATIVE NEGATIVE Final    Comment:        The GeneXpert MRSA Assay (FDA approved for NASAL specimens only), is one component of a comprehensive MRSA colonization surveillance program. It is not intended to diagnose MRSA infection nor to guide or monitor treatment for MRSA infections. Performed at [facility], [address] [place], [place] [address]          Radiology Studies: No results found.      Scheduled  Meds: . amLODipine  5 mg Oral Daily  . Chlorhexidine Gluconate Cloth  6 each Topical Daily  . Gerhardt's butt cream   Topical BID  . insulin aspart  0-9 Units Subcutaneous TID WC  . insulin glargine  10 Units Subcutaneous QHS  . levothyroxine  112 mcg Oral Daily  . nystatin   Topical BID  . predniSONE  15 mg Oral Q breakfast   Followed by  . [START ON 07/26/2018] predniSONE  5 mg Oral Q breakfast  . QUEtiapine  12.5 mg Oral BID  . sodium chloride flush  10-40 mL Intracatheter Q12H  . tacrolimus  1 mg Oral QPC breakfast   Continuous Infusions: . sodium chloride 150 mL/hr at 07/24/18 1800  . cefTRIAXone (ROCEPHIN)  IV 200 mL/hr at 07/24/18 1800  . DAPTOmycin (CUBICIN)  IV Stopped (08/05/2018 2021)     LOS: 2 days    Time spent: 60 mimut    Lynden Oxford, MD [facility] 07/24/2018, 7:10 PM     Please page through AMION:  [email or web address] Password [email or web address] If 7PM-7AM, please contact night-coverage

## 2018-07-25 ENCOUNTER — Inpatient Hospital Stay (HOSPITAL_COMMUNITY): Payer: Medicare Other

## 2018-07-25 DIAGNOSIS — R4182 Altered mental status, unspecified: Secondary | ICD-10-CM

## 2018-07-25 LAB — CBC WITH DIFFERENTIAL/PLATELET
Abs Immature Granulocytes: 0.04 10*3/uL (ref 0.00–0.07)
BASOS ABS: 0 10*3/uL (ref 0.0–0.1)
BASOS PCT: 0 %
EOS ABS: 0 10*3/uL (ref 0.0–0.5)
Eosinophils Relative: 0 %
HCT: 27.6 % — ABNORMAL LOW (ref 36.0–46.0)
Hemoglobin: 8.2 g/dL — ABNORMAL LOW (ref 12.0–15.0)
IMMATURE GRANULOCYTES: 1 %
LYMPHS ABS: 0.7 10*3/uL (ref 0.7–4.0)
Lymphocytes Relative: 12 %
MCH: 27 pg (ref 26.0–34.0)
MCHC: 29.7 g/dL — ABNORMAL LOW (ref 30.0–36.0)
MCV: 90.8 fL (ref 80.0–100.0)
Monocytes Absolute: 0.3 10*3/uL (ref 0.1–1.0)
Monocytes Relative: 5 %
NEUTROS PCT: 82 %
NRBC: 0 % (ref 0.0–0.2)
Neutro Abs: 4.8 10*3/uL (ref 1.7–7.7)
Platelets: 45 10*3/uL — ABNORMAL LOW (ref 150–400)
RBC: 3.04 MIL/uL — AB (ref 3.87–5.11)
RDW: 19.5 % — AB (ref 11.5–15.5)
WBC: 5.8 10*3/uL (ref 4.0–10.5)

## 2018-07-25 LAB — GLUCOSE, CAPILLARY
GLUCOSE-CAPILLARY: 104 mg/dL — AB (ref 70–99)
GLUCOSE-CAPILLARY: 24 mg/dL — AB (ref 70–99)
GLUCOSE-CAPILLARY: 108 mg/dL — AB (ref 70–99)
Glucose-Capillary: 24 mg/dL — CL (ref 70–99)
Glucose-Capillary: 75 mg/dL (ref 70–99)
Glucose-Capillary: 25 mg/dL — CL (ref 70–99)
Glucose-Capillary: 99 mg/dL (ref 70–99)
Glucose-Capillary: 56 mg/dL — ABNORMAL LOW (ref 70–99)
Glucose-Capillary: 120 mg/dL — ABNORMAL HIGH (ref 70–99)

## 2018-07-25 LAB — MAGNESIUM: MAGNESIUM: 1.7 mg/dL (ref 1.7–2.4)

## 2018-07-25 LAB — RENAL FUNCTION PANEL
ANION GAP: 5 (ref 5–15)
Albumin: 1.4 g/dL — ABNORMAL LOW (ref 3.5–5.0)
BUN: 26 mg/dL — ABNORMAL HIGH (ref 6–20)
CALCIUM: 7.6 mg/dL — AB (ref 8.9–10.3)
CO2: 29 mmol/L (ref 22–32)
Chloride: 108 mmol/L (ref 98–111)
Creatinine, Ser: 2.84 mg/dL — ABNORMAL HIGH (ref 0.44–1.00)
GFR calc Af Amer: 21 mL/min — ABNORMAL LOW (ref >60)
GFR, EST NON AFRICAN AMERICAN: 18 mL/min — AB (ref >60)
Glucose, Bld: 89 mg/dL (ref 70–99)
PHOSPHORUS: 2.9 mg/dL (ref 2.5–4.6)
Potassium: 3 mmol/L — ABNORMAL LOW (ref 3.5–5.1)
SODIUM: 142 mmol/L (ref 135–145)

## 2018-07-25 LAB — LACTIC ACID, PLASMA: Lactic Acid, Venous: 1.1 mmol/L (ref 0.5–1.9)

## 2018-07-25 LAB — PROTIME-INR
INR: 1.28
PROTHROMBIN TIME: 15.9 s — AB (ref 11.4–15.2)

## 2018-07-25 LAB — IRON AND TIBC: Iron: 48 ug/dL (ref 28–170)

## 2018-07-25 LAB — FERRITIN: FERRITIN: 305 ng/mL (ref 11–307)

## 2018-07-25 MED ORDER — POTASSIUM CHLORIDE CRYS ER 20 MEQ PO TBCR
40.0000 meq | EXTENDED_RELEASE_TABLET | Freq: Once | ORAL | Status: AC
  Administered 2018-07-25: 40 meq via ORAL
  Filled 2018-07-25: qty 2

## 2018-07-25 MED ORDER — DEXTROSE 50 % IV SOLN
INTRAVENOUS | Status: AC
  Administered 2018-07-25: 50 mL
  Filled 2018-07-25: qty 50

## 2018-07-25 MED ORDER — LEVOTHYROXINE SODIUM 100 MCG IV SOLR
66.0000 ug | Freq: Every day | INTRAVENOUS | Status: DC
  Administered 2018-07-26 – 2018-07-27 (×2): 66 ug via INTRAVENOUS
  Filled 2018-07-25 (×3): qty 5

## 2018-07-25 MED ORDER — CLONIDINE HCL 0.1 MG/24HR TD PTWK
0.1000 mg | MEDICATED_PATCH | TRANSDERMAL | Status: DC
  Administered 2018-07-25: 0.1 mg via TRANSDERMAL
  Filled 2018-07-25: qty 1

## 2018-07-25 MED ORDER — DEXTROSE 50 % IV SOLN
INTRAVENOUS | Status: AC
  Filled 2018-07-25: qty 50

## 2018-07-25 MED ORDER — DEXTROSE-NACL 5-0.45 % IV SOLN
INTRAVENOUS | Status: DC
  Administered 2018-07-25: 12:00:00 via INTRAVENOUS

## 2018-07-25 MED ORDER — DEXTROSE 10 % IV SOLN
INTRAVENOUS | Status: DC
  Administered 2018-07-25: 16:00:00 via INTRAVENOUS

## 2018-07-25 MED ORDER — INSULIN ASPART 100 UNIT/ML ~~LOC~~ SOLN: 0.0000 [IU] | Freq: Every day | SUBCUTANEOUS | Status: DC

## 2018-07-25 MED ORDER — INSULIN ASPART 100 UNIT/ML ~~LOC~~ SOLN
0.0000 [IU] | Freq: Three times a day (TID) | SUBCUTANEOUS | Status: DC
  Administered 2018-07-26 (×2): 3 [IU] via SUBCUTANEOUS
  Administered 2018-07-26: 2 [IU] via SUBCUTANEOUS
  Administered 2018-07-27 – 2018-07-29 (×3): 3 [IU] via SUBCUTANEOUS
  Administered 2018-07-30: 5 [IU] via SUBCUTANEOUS
  Administered 2018-07-31: 3 [IU] via SUBCUTANEOUS
  Administered 2018-07-31: 5 [IU] via SUBCUTANEOUS
  Administered 2018-08-01: 3 [IU] via SUBCUTANEOUS
  Administered 2018-08-01 – 2018-08-02 (×3): 2 [IU] via SUBCUTANEOUS

## 2018-07-25 MED ORDER — MAGNESIUM SULFATE 2 GM/50ML IV SOLN
2.0000 g | Freq: Once | INTRAVENOUS | Status: AC
  Administered 2018-07-25: 2 g via INTRAVENOUS
  Filled 2018-07-25: qty 50

## 2018-07-25 MED ORDER — DEXTROSE 50 % IV SOLN
INTRAVENOUS | Status: AC
  Administered 2018-07-25: 50 mL
  Administered 2018-07-25: 11:00:00
  Filled 2018-07-25: qty 50

## 2018-07-25 MED ORDER — SODIUM CHLORIDE 0.9 % IV SOLN
600.0000 mg | INTRAVENOUS | Status: DC
  Administered 2018-07-26 – 2018-08-02 (×4): 600 mg via INTRAVENOUS
  Filled 2018-07-25 (×4): qty 12

## 2018-07-25 NOTE — Progress Notes (Signed)
Pt arrived to 5w36 from 31M. Pt is arousal to voice and pain stimuli. Pt not oriented only mumbles. Pt has generalized bruising and abrasions. Pt has large bruise to upper back. Pt has MASD to groin, sacrum and buttocks. Pt has stage 3 to sacrum foam dsg changed. Pt has stage 3 to left elbow and is puffy and red around site foam dsg changed. Pt has skin tear to left forearm, foam dsg changed. Pt placed on stepdown monitor M05. Pt placed on bed alarm. Will continue to monitor pt. Nelda MarseilleJenny Thacker, RN

## 2018-07-25 NOTE — Progress Notes (Signed)
PROGRESS NOTE    GIAVANNA KANG  ZOX:[id number] DOB: [birth year] DOA: 07/17/2018 PCP: Dois Davenport, MD      Brief Narrative:  Mrs. Estabrooks is a 52 y.o. 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  Discussed with Orthopedics, ID and Nephrology here and transferred for plan for possible AKA.  Patient incidentally noted to have new thrombocytopenia.      Assessment & Plan:  Thrombocytopenia This is new from May.  At admission to OSH on 11/12, platelets 106K, coags normal. She got fluids and by next morning 11/13, platelets down to 68K, Hgb down to 7.3 g/dL.   Was on Linezolid prior to admission, changed to Ceftriaxone/Daptomycin since.  She was on heparin at the OSH from 11/12-11/15.  Ddx here includes med effect, TTP, DIC, HIT.  Was on linezolid prior to admission, drop in Plts really not consistent with heparin induced or from CTX, dapto.  Clinical picture certainly consistent with TTP, given new AKI, clearly altered mentation, anemia requiring transfusion at OSH.  She was initially diagnosed with sepsis, which could cause DIC, although no clinical bleeding/oozing is noted. Doubt ITP given associated anemia, confusion. -Consult hematology, appreciate expert guidance HIT panel is mildly elevated although do not suspect that this is consistent with HIT.  SRA is sent. Less likely TTP or DIC.  Currently no change in plan recommended by hematology.   Acute metabolic encephalopathy. Likely secondary to sepsis, ESRD, delirium secondary to ICU stay. Will use PRN Haldol scheduled Seroquel and as needed Ativan. CT head unremarkable.  EEG shows metabolic encephalopathy without any evidence of acute seizures. Continue supportive care.  Acute sepsis present on admission secondary to  osteomyelitis There is no surrounding cellulitis.  -Continue ceftriaxone, Daptomycin -Consult Orthopedics, appreciate cares Appreciate ID involvement as well.  Anemia, of chronic kidney disease Hgb on admission 8.9 g/dL consistent with patient's baseline anemia of chronic kidney disease.  It did drop to 6.5g/dL with fluid resuscitation, transfused at OSH. -Trend Hgb  Acute kidney injury and chronic kidney disease stage IV Hypernatremia Recent baseline Cr 2.4, worse up to 2.9 on admission, worsening since.  UA on admission showed negligible RBCs, WBCs TNTC.   Creatinine stabilizing -Consult Nephrology, appreciate cares -Continue tacrolimus, pred at stress dose for 3 days  Diarrhea Monitor for now.  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 -Continue glargine -Continue SSI  Hypertension -Hold carvedilol until hemodynamics clearer  Stage II, sacrum, present on admission  Bipolar -Hold Abilify and mirtazapine  Hypothyroidism -Continue levothyroxine  Other medications -Continue oxycodone for now   Pressure Injury 08/03/2018 Stage III -  Full thickness tissue loss. Subcutaneous fat may be visible but bone, tendon or muscle are NOT exposed. (Active)  08/01/2018 2000   Location: Sacrum  Location Orientation: Mid  Staging: Stage III -  Full thickness tissue loss. Subcutaneous fat may be visible but bone, tendon or muscle are NOT exposed.  Wound Description (Comments):   Present on Admission:       MDM and disposition: The below labs and imaging reports were reviewed and summarized above.  Medication management as above.  The patient was admitted with confusion, found to have osteo, acidosis, worsenign renal function, and new thrombocytopenia.   DVT  prophylaxis: SCDs Code Status: FULL Family Communication: None present    Consultants:    Hematology  Nephrology  Orthopedics  Procedures:     Antimicrobials:   Linezolid prior to admission  Ceftriaxone 11/12 >>  Daptomycin 11/12 >>    Subjective: Minimal oral intake.  Remains more confused.  No nausea no vomiting.  Diarrhea is resolving.  Objective: Vitals:   07/25/18 1100 07/25/18 1105 07/25/18 1200 07/25/18 1554  BP: (!) 167/88  (!) 164/88   Pulse: 99  96   Resp: 11  11   Temp:  (!) 97.4 F (36.3 C)  (!) 96.3 F (35.7 C)  TempSrc:  Axillary  Axillary  SpO2: 96%  98%   Weight:      Height:        Intake/Output Summary (Last 24 hours) at 07/25/2018 1758 Last data filed at 07/25/2018 1600 Gross per 24 hour  Intake 2969.44 ml  Output 650 ml  Net 2319.44 ml   Filed Weights   07/23/18 0440 07/24/18 0500 07/25/18 0500  Weight: 63 kg 64.1 kg 70.4 kg    Examination: General appearance: elderly adult female, awake but confused HEENT: Mildly icteric, conjunctiva pink, lids and lashes normal. No nasal deformity, discharge, epistaxis.  Lips dry, no oral lesions, OP very dry, dentition mostly edentulous.  Hearing normal Skin: Warm and dry.  No jaundice.  No suspicious rashes or lesions. Cardiac: RRR, nl S1-S2, no murmurs appreciated.  Capillary refill is brisk.  JVP not visible.  No LE edema.  Radia  pulses 2+ and symmetric. Respiratory: Normal respiratory rate and rhythm.  CTAB without rales or wheezes. Abdomen: Abdomen soft.  Nonfocal  TTP with voluntary gaurding, no rigidity. No ascites, distension, hepatosplenomegaly.   MSK: No deformities or effusions. Neuro: Awake and alert.  EOMI, moves all extremities with equal and 5/5 strength. Speech dysrthric due to dry mouth.    Psych: Sensorium intact and responding to questions, attention diminished.  States she is at [facility] year is "2010", unable to articulate anything else. Affect blunted.  Judgment and insight appear severely impaired.    Data Reviewed: I have personally reviewed  following labs and imaging studies:  CBC: Recent Labs  Lab December 18, 2017 1801 07/23/18 0435 07/23/18 1059 07/24/18 0429 07/25/18 0448  WBC 8.4 9.5  --  5.4 5.8  NEUTROABS  --   --   --  4.7 4.8  HGB 10.4* 10.2*  --  8.5* 8.2*  HCT 33.7* 33.9*  --  28.1* 27.6*  MCV 88.9 88.1  --  87.5 90.8  PLT 69* 65* 55* 46* 45*   Basic Metabolic Panel: Recent Labs  Lab December 18, 2017 1801 07/23/18 0435 07/24/18 0429 07/25/18 0448  NA 146* 145 147* 142  K 4.4 4.1 3.8 3.0*  CL 122* 119* 112* 108  CO2 18* 19* 25 29  GLUCOSE 60* 140* 286* 89  BUN 38* 35* 30* 26*  CREATININE 3.31* 3.23* 3.23* 2.84*  CALCIUM 8.9 8.5* 7.9* 7.6*  MG  --   --   --  1.7  PHOS 3.1  --   --  2.9   GFR: Estimated Creatinine Clearance: 23.6 mL/min (A) (by C-G formula based on SCr of 2.84 mg/dL (H)). Liver Function Tests: Recent Labs  Lab December 18, 2017 1801 07/23/18 1059 07/25/18 0448  AST  --  16  --   ALT  --  16  --   ALKPHOS  --  124  --   BILITOT  --  0.7  --  PROT  --  5.0*  --   ALBUMIN 1.5* 1.4* 1.4*   No results for input(s): LIPASE, AMYLASE in the last 168 hours. No results for input(s): AMMONIA in the last 168 hours. Coagulation Profile: Recent Labs  Lab 07/23/18 1059 07/25/18 0448  INR 1.41 1.28   Cardiac Enzymes: Recent Labs  Lab 07/23/18 0435  CKTOTAL 21*   BNP (last 3 results) No results for input(s): PROBNP in the last 8760 hours. HbA1C: No results for input(s): HGBA1C in the last 72 hours. CBG: Recent Labs  Lab 07/25/18 1057 07/25/18 1100 07/25/18 1119 07/25/18 1547 07/25/18 1640  GLUCAP 31* 25* 99 56* 120*   Lipid Profile: No results for input(s): CHOL, HDL, LDLCALC, TRIG, CHOLHDL, LDLDIRECT in the last 72 hours. Thyroid Function Tests: No results for input(s): TSH, T4TOTAL, FREET4, T3FREE, THYROIDAB in the last 72 hours. Anemia Panel: Recent Labs    07/25/18 1103  FERRITIN 305  TIBC NOT CALCULATED  IRON 48   Urine analysis:    Component Value Date/Time   COLORURINE  YELLOW 01/04/2018 1341   APPEARANCEUR CLOUDY (A) 01/04/2018 1341   LABSPEC 1.015 01/04/2018 1341   PHURINE 6.0 01/04/2018 1341   GLUCOSEU >=500 (A) 01/04/2018 1341   HGBUR LARGE (A) 01/04/2018 1341   BILIRUBINUR NEGATIVE 01/04/2018 1341   KETONESUR 20 (A) 01/04/2018 1341   PROTEINUR 100 (A) 01/04/2018 1341   UROBILINOGEN 0.2 03/02/2015 1153   NITRITE NEGATIVE 01/04/2018 1341   LEUKOCYTESUR MODERATE (A) 01/04/2018 1341   Sepsis Labs: @LABRCNTIP (procalcitonin:4,lacticacidven:4)  ) Recent Results (from the past 240 hour(s))  MRSA PCR Screening     Status: None   Collection Time: 07/21/2018  2:28 PM  Result Value Ref Range Status   MRSA by PCR NEGATIVE NEGATIVE Final    Comment:        The GeneXpert MRSA Assay (FDA approved for NASAL specimens only), is one component of a comprehensive MRSA colonization surveillance program. It is not intended to diagnose MRSA infection nor to guide or monitor treatment for MRSA infections. Performed at [facility], [address], [place], [place] [address]          Radiology Studies: Ct Head Wo Contrast  Result Date: 07/25/2018 CLINICAL DATA:  52 year old female with altered level of consciousness. EXAM: CT HEAD WITHOUT CONTRAST TECHNIQUE: Contiguous axial images were obtained from the base of the skull through the vertex without intravenous contrast. COMPARISON:  07/19/2018 FINDINGS: Motif shin artifact decreases sensitivity despite rescanning. Brain: No evidence of acute infarction, hemorrhage, hydrocephalus, extra-axial collection or mass lesion/mass effect. Mild atrophy and chronic small-vessel white matter ischemic changes again noted. Vascular: Vertebral and carotid atherosclerotic calcifications noted. Skull: No acute abnormality. Sinuses/Orbits: No acute abnormality. Mucoperiosteal thickening of the paranasal sinuses again noted. Other: None IMPRESSION: 1. No evidence of acute intracranial abnormality 2. Mild atrophy and chronic  small-vessel white matter ischemic changes 3. Chronic paranasal sinus disease. Electronically Signed   By: Harmon Pier M.D.   On: 07/25/2018 12:01        Scheduled Meds: . Chlorhexidine Gluconate Cloth  6 each Topical Daily  . cloNIDine  0.1 mg Transdermal Weekly  . Gerhardt's butt cream   Topical BID  . insulin aspart  0-15 Units Subcutaneous TID WC  . insulin aspart  0-5 Units Subcutaneous QHS  . levothyroxine  66 mcg Intravenous Daily  . nystatin   Topical BID  . [START ON 07/26/2018] predniSONE  5 mg Oral Q breakfast  . QUEtiapine  12.5  mg Oral BID  . sodium chloride flush  10-40 mL Intracatheter Q12H  . tacrolimus  1 mg Oral QPC breakfast   Continuous Infusions: . cefTRIAXone (ROCEPHIN)  IV 200 mL/hr at 07/25/18 1600  . [START ON 07/26/2018] DAPTOmycin (CUBICIN)  IV    . dextrose 50 mL/hr at 07/25/18 1603     LOS: 3 days    Time spent: 60 mimut    Lynden Oxford, MD [facility] 07/25/2018, 5:58 PM     Please page through AMION:  [email or web address] Password [email or web address] If 7PM-7AM, please contact night-coverage

## 2018-07-25 NOTE — Progress Notes (Addendum)
Report given to receiving RN on 5W. VVS. Pt transported via bed. Pt alert to self. All belongings with patient, all questions answered.

## 2018-07-25 NOTE — Progress Notes (Signed)
Patient ID: [name], female   DOB: April 03, 1966, 52 y.o.   MRN: [id number] [facility] Progress Note   Assessment/ Plan:   1.  Acute kidney injury on chronic kidney disease stage III T: This appears to be from volume depletion/ATN associated with possible sepsis-nonoliguric at this time 2.  Status post deceased donor kidney transplant: Continue ongoing chronic immunosuppressive therapy with Prograf 1 mg daily and prednisone 5 mg daily-Prograf level pending as it is unusual for this medication to be taken daily. 3.  Diabetic ketoacidosis: This appears to have resolved-off bicarbonate drip and transitioned to half-normal saline.  Transitioned off of insulin drip to basal/mealtime coverage. 4.  Osteomyelitis of right BKA stump with sepsis: Afebrile on coverage with daptomycin and ceftriaxone 5.  Thrombocytopenia: Suspected to be associated with chronic inflammation/medications-no evidence of TTP/DIC.  Appreciate input from hematology.  Platelet count appears to be stable overnight and she is without any bleeding diathesis. 6.  Anemia: Without overt blood loss, suspect anemia from underlying chronic kidney disease as well as critical illness/malnutrition-inflammatory complex with ongoing osteomyelitis.  Follow globin/hematocrit trend to decide on need for ESA.  Subjective:   Without acute events noted overnight-patient unable to localize any problems.   Objective:   BP (!) 169/96   Pulse 99   Temp 97.6 F (36.4 C) (Axillary)   Resp 11   Ht 5\' 8"  (1.727 m)   Wt 70.4 kg   LMP 11/19/2011 Comment: pt had tubal ligation  SpO2 97%   BMI 23.60 kg/m   Intake/Output Summary (Last 24 hours) at 07/25/2018 0856 Last data filed at 07/25/2018 0600 Gross per 24 hour  Intake 3033.63 ml  Output 650 ml  Net 2383.63 ml   Weight change: 6.3 kg  Physical Exam: Gen: Appears to be uncomfortable resting in bed CVS: Pulse regular rhythm, normal rate, S1 and S2 normal Resp: Clear to  auscultation bilaterally, no distinct rales or rhonchi Abd: Soft, obese, nontender Ext: Right BKA stump in clean/dry dressing.  No left lower extremity edema.  Imaging: No results found.  Labs: BMET Recent Labs  Lab 07/12/2018 1801 07/23/18 0435 07/24/18 0429 07/25/18 0448  NA 146* 145 147* 142  K 4.4 4.1 3.8 3.0*  CL 122* 119* 112* 108  CO2 18* 19* 25 29  GLUCOSE 60* 140* 286* 89  BUN 38* 35* 30* 26*  CREATININE 3.31* 3.23* 3.23* 2.84*  CALCIUM 8.9 8.5* 7.9* 7.6*  PHOS 3.1  --   --  2.9   CBC Recent Labs  Lab 07/18/2018 1801 07/23/18 0435 07/23/18 1059 07/24/18 0429 07/25/18 0448  WBC 8.4 9.5  --  5.4 5.8  NEUTROABS  --   --   --  4.7 4.8  HGB 10.4* 10.2*  --  8.5* 8.2*  HCT 33.7* 33.9*  --  28.1* 27.6*  MCV 88.9 88.1  --  87.5 90.8  PLT 69* 65* 55* 46* 45*    Medications:    . Chlorhexidine Gluconate Cloth  6 each Topical Daily  . cloNIDine  0.1 mg Transdermal Weekly  . Gerhardt's butt cream   Topical BID  . insulin aspart  0-15 Units Subcutaneous TID WC  . insulin aspart  0-5 Units Subcutaneous QHS  . levothyroxine  66 mcg Intravenous Daily  . nystatin   Topical BID  . [START ON 07/26/2018] predniSONE  5 mg Oral Q breakfast  . QUEtiapine  12.5 mg Oral BID  . sodium chloride flush  10-40 mL Intracatheter Q12H  .  tacrolimus  1 mg Oral QPC breakfast   Zetta Bills, MD 07/25/2018, 8:56 AM

## 2018-07-25 NOTE — Progress Notes (Signed)
PHARMACY NOTE:  ANTIMICROBIAL RENAL DOSAGE ADJUSTMENT  Current antimicrobial regimen includes a mismatch between antimicrobial dosage and estimated renal function.  As per policy approved by the Pharmacy & Therapeutics and Medical Executive Committees, the antimicrobial dosage will be adjusted accordingly.  Current antimicrobial dosage:  Daptomycin 300mg  IV every 48 hours  Indication: Osteomyelitis of right BKA stump  Renal Function:  Estimated Creatinine Clearance: 23.6 mL/min (A) (by C-G formula based on SCr of 2.84 mg/dL (H)). []      On intermittent HD, scheduled: []      On CRRT    Antimicrobial dosage has been changed to:  Daptomycin 600mg  IV every 48 hours  Additional comments: Patient's antibiotics dose has been adjusted based on indication of Osteomyelitis   Thank you for allowing pharmacy to be a part of this patient's care.  Bradley FerrisJoshua , PharmD 07/25/2018 11:41 AM PGY-1 Pharmacy Resident Direct Phone: [phone] Please check [email or web address] for unit-specific pharmacist phone numbers

## 2018-07-25 NOTE — Progress Notes (Signed)
EEG complete - results pending 

## 2018-07-25 NOTE — Consult Note (Signed)
[facility]    Date of Admission:  07/28/2018     Total days of antibiotics 6               Reason for Consult: Osteomyelitis   Referring Provider: Antibiotic Stewardship Primary Care Provider: Dois Davenport, MD   Assessment/Plan:  [name] is a 52 year old female deceased donor kidney transplant recipient 2011 with severe peripheral vascular disease resulting in a below knee amputation in September of this year. She has since had complicated wound healing and admitted for altered mental status and found to have osteomyelitis of the right BKA with the recommendation for AKA when medically able. She is on Day 6 of antimicrobial therapy now with Daptomycin and ceftriaxone. Blood cultures obtained at [facility] were without growth. There was concern for urinary source with previous cultures of Klebsiella and Proteus that were multidrug resistant. Urine is unlikely the source of infection with the most prominent source at present being her right BKA with poor healing and osteomyelitis. She has multiple co-morbidities that are continuing to make wound healing challenging including protein calorie malnutrition, severe PVD and poorly controlled diabetes. Hematology following for thrombocytopenia with most likely cause medication, infection  or chronic inflammation.     1. Agree with choice of daptomycin due to renal function and continue ceftriaxone. Increase the dose to 8mg /kg. Continue renal dosing per pharmacy.  2. Wound care and surgical interventions per orthopedics.  3. Will check Hepatitis C for Health Maintenance. 4. Continue to monitor thrombocytopenia    Active Problems:   Severe protein-calorie malnutrition (HCC)   Osteomyelitis (HCC)   Pressure injury of skin   Diabetic polyneuropathy associated with type 1 diabetes mellitus (HCC)   Dehiscence of amputation stump (HCC)   . Chlorhexidine Gluconate Cloth  6 each Topical Daily  .  cloNIDine  0.1 mg Transdermal Weekly  . dextrose      . Gerhardt's butt cream   Topical BID  . insulin aspart  0-15 Units Subcutaneous TID WC  . insulin aspart  0-5 Units Subcutaneous QHS  . levothyroxine  66 mcg Intravenous Daily  . nystatin   Topical BID  . [START ON 07/26/2018] predniSONE  5 mg Oral Q breakfast  . QUEtiapine  12.5 mg Oral BID  . sodium chloride flush  10-40 mL Intracatheter Q12H  . tacrolimus  1 mg Oral QPC breakfast     HPI: [name] is a 52 y.o. female with previous medical history as detailed below and significant for poorly controlled diabetes, deceased donor renal transplant in 2011 and on immunosupression followed by [facility], and recent BKA due to severe PVD on 05/11/18 comlicated by delayed healing and wound infection followed by debridement on 10/21 who was admitted to [facility] on 07/19/18 from her skilled nursing facility with altered mental status and hallucination.  At [facility] found to have DKA and worsening mental status with concern for stump infection. She had received 2 doses of vancomycin but was transitioned to ceftriaxone and vancomycin. When she arrived at [facility] she was on Day 3 of therapy. MRI of the right lower extremity with mild cortical irregularity  And mininmla marrow edema at the tip of the tibial stump either post-surgical changes or osteomyelitis. Renal function had continued to worsen thus transferred to [facility].    Orthopedics evaluated with recommendation for conversion of below-knee amputation to above-knee amputation. Renal following with continuation of Prograf and Prednisone. She has  remained afebrile since admission without leukocytosis. Currently on Day 6 of antimicrobial therapy with ceftriaxone and daptomycin.    Review of Systems: Review of Systems  Unable to perform ROS: Mental status change     Past Medical History:  Diagnosis Date  . Arthritis    "elbows, knees, legs, back" (09/24/2015)  . CKD  (chronic kidney disease)   . Daily headache   . Depression    "years ago"  . End stage renal disease (HCC)    right arm AV graft, resolved post transplant  . Hypertension   . Hypothyroid   . 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 Kidney disease 2011  . Pneumonia ~ 2007?  [name] Type II diabetes mellitus (HCC)    Insulin dependant    Social History   Tobacco Use  . Smoking status: Former Smoker    Packs/day: 1.00    Years: 11.00    Pack years: 11.00    Types: Cigarettes    Last attempt to quit: 09/21/1998    Years since quitting: 19.8  . Smokeless tobacco: Never Used  Substance Use Topics  . Alcohol use: No  . Drug use: No    Family History  Problem Relation Age of Onset  . Emphysema Mother   . Throat cancer Mother   . COPD Mother   . Cancer Mother   . Emphysema Father   . COPD Father   . Stroke Father   . ADD / ADHD Son     Allergies  Allergen Reactions  . Latex Itching  . Ace Inhibitors Cough  . Adhesive [Tape] Itching and Other (See Comments)    Please use paper tape  . Lisinopril Cough    OBJECTIVE: Blood pressure (!) 156/88, pulse 100, temperature (!) 97.4 F (36.3 C), temperature source Axillary, resp. rate 12, height 5\' 8"  (1.727 m), weight 70.4 kg, last menstrual period 11/19/2011, SpO2 96 %.  Physical Exam  Constitutional: She appears lethargic. She has a sickly appearance. She appears ill. No distress. She is restrained.  Cardiovascular: Regular rhythm and normal heart sounds. Tachycardia present.  Pulmonary/Chest: Effort normal and breath sounds normal.  Neurological: She appears lethargic.  Oriented to person and place.   Skin: Skin is warm and dry.  Right BKA dressing is clean and dry.     Lab Results Lab Results  Component Value Date   WBC 5.8 07/25/2018   HGB 8.2 (L) 07/25/2018   HCT 27.6 (L) 07/25/2018   MCV 90.8 07/25/2018   PLT 45 (L) 07/25/2018    Lab Results  Component Value Date   CREATININE 2.84 (H)  07/25/2018   BUN 26 (H) 07/25/2018   NA 142 07/25/2018   K 3.0 (L) 07/25/2018   CL 108 07/25/2018   CO2 29 07/25/2018    Lab Results  Component Value Date   ALT 16 07/23/2018   AST 16 07/23/2018   ALKPHOS 124 07/23/2018   BILITOT 0.7 07/23/2018     Microbiology: Recent Results (from the past 240 hour(s))  MRSA PCR Screening     Status: None   Collection Time: 07/18/2018  2:28 PM  Result Value Ref Range Status   MRSA by PCR NEGATIVE NEGATIVE Final    Comment:        The GeneXpert MRSA Assay (FDA approved for NASAL specimens only), is one component of a comprehensive MRSA colonization surveillance program. It is not intended to diagnose MRSA infection nor to guide or monitor treatment for  MRSA infections. Performed at [facility], [address] [place], [place] [address]      Marcos EkeGreg Crystalina Stodghill, NP [facility] [phone] Pager  07/25/2018  11:24 AM

## 2018-07-25 NOTE — Progress Notes (Signed)
Pharmacy Heparin Induced Thrombocytopenia (HIT) Note:  [name] is a 52 y.o. female being evaluated for HIT. Heparin was started on 11[facility]12[facility]11/15 at [facility], and baseline platelets were 109. HIT labs were ordered on 11/16 when platelets dropped to 55. HIT antibody elevated at 0.49  Heparin Induced Plt Ab  Date/Time Value Ref Range Status  07/23/2018 02:30 PM 0.499 (H) 0.000 - 0.400 OD Final    Comment:    (NOTE) Performed At: [facility] [address] [place], [place] [id number] [name] SchimkeNagendra Sanjai MD HQ:[id number]:[phone]      Score = 0 Score = 1 Score = 2 Calculated Score  Thrombocytopenia -Platelet fall < 30% -Any platelet fall with nadir < 10 -Platelet fall >50% BUT surgery within 3 days  -Any combination of platelet fall/nadir that does not fit score 0 or 2 -Platelet fall >50% AND nadir ?20 AND no surgery within 3 days 2      Timing -Platelets fall on days ? 4  AND no prior heparin exposure in previous 100 days -Consistent w/ platelet fall on days 5-10 but missing counts -Platelet fall within 1 day of start AND prior exposure to heparin within previous 31-100 days -Platelet fall after day 10 -Platelet fall 5-10 days after starting heparin -Platelet fall within 1 day of heparin start AND prior exposure within previous 5-30 days 1  Thrombosis -None suspected -Recurrent thrombosis in patient receiving therapeutic anticoagulants -Suspected thrombosis (awaiting confirmation via imaging) -Erythematous skin lesions at heparin injection sites -Confirmed new thrombosis -Skin necrosis at injection site -Anaphylactoid reaction to heparin IV bolus -Adrenal hemorrhage 0  oTher Causes -Probable other cause (see protocol for full list) -Possible other cause evident (see protocol for full list) -No alternative explanation 1  Total Calculated 4T Score:      4   Name of MD Contacted: [name] OxfordPranav [name]   Algorithm Recommendation: Unlikely HIT. Total 4T score could potentially be higher  but unable to collect all information from OSH. SRA ordered. Heparin was not initiated during this admission and patient is currently is not on anticoagulant. Heparin added to allergies for now.   Plan: Will follow up on SRA results  Gwynneth AlbrightSara [name], Ilda Bassetharm D PGY1 Pharmacy Resident  Phone [phone] 07/25/2018   4:25 PM

## 2018-07-25 NOTE — Procedures (Signed)
ELECTROENCEPHALOGRAM REPORT   Patient: [name]       Room #: 3M12C EEG No. ID: [id number] Age: 52 y.o.        Sex: female Referring Physician: Allena KatzPatel Report Date:  07/25/2018        Interpreting Physician: Thana FarrEYNOLDS, Alamin Mccuiston  History: [name] is an 52 y.o. female with altered mental status  Medications:  Rocephin, Clonidine, Insulin, Synthroid, Magnesium sulfate, Deltasone, Seroquel, Prograf  Conditions of Recording:  This is a 21 channel routine scalp EEG performed with bipolar and monopolar montages arranged in accordance to the international 10/20 system of electrode placement. One channel was dedicated to EKG recording.  The patient is in the awake but uncooperative state.  Description:  The waking background activity is slow and poorly organized.  It consists of a low voltage polymorphic delta activity that is continuous and diffusely distributed.   No epileptiform activity is noted.   Hyperventilation and intermittent photic stimulation were not performed.   IMPRESSION: This is an abnormal EEG secondary to general background slowing.  This finding may be seen with a diffuse disturbance that is etiologically nonspecific, but may include a metabolic encephalopathy, among other possibilities.  No epileptiform activity was noted.     Thana FarrLeslie Griffyn Kucinski, MD Neurology [phone] 07/25/2018, 11:06 AM

## 2018-07-26 ENCOUNTER — Ambulatory Visit (INDEPENDENT_AMBULATORY_CARE_PROVIDER_SITE_OTHER): Payer: Self-pay | Admitting: Physician Assistant

## 2018-07-26 DIAGNOSIS — Z91040 Latex allergy status: Secondary | ICD-10-CM

## 2018-07-26 DIAGNOSIS — Z888 Allergy status to other drugs, medicaments and biological substances status: Secondary | ICD-10-CM

## 2018-07-26 DIAGNOSIS — Z91048 Other nonmedicinal substance allergy status: Secondary | ICD-10-CM

## 2018-07-26 DIAGNOSIS — R5383 Other fatigue: Secondary | ICD-10-CM

## 2018-07-26 DIAGNOSIS — Z89511 Acquired absence of right leg below knee: Secondary | ICD-10-CM

## 2018-07-26 LAB — CBC WITH DIFFERENTIAL/PLATELET
Abs Immature Granulocytes: 0.04 10*3/uL (ref 0.00–0.07)
BASOS ABS: 0 10*3/uL (ref 0.0–0.1)
BASOS PCT: 0 %
EOS ABS: 0 10*3/uL (ref 0.0–0.5)
EOS PCT: 0 %
HEMATOCRIT: 32.9 % — AB (ref 36.0–46.0)
Hemoglobin: 9.9 g/dL — ABNORMAL LOW (ref 12.0–15.0)
Immature Granulocytes: 1 %
Lymphocytes Relative: 9 %
Lymphs Abs: 0.5 10*3/uL — ABNORMAL LOW (ref 0.7–4.0)
MCH: 27 pg (ref 26.0–34.0)
MCHC: 30.1 g/dL (ref 30.0–36.0)
MCV: 89.6 fL (ref 80.0–100.0)
Monocytes Absolute: 0.2 10*3/uL (ref 0.1–1.0)
Monocytes Relative: 4 %
NRBC: 0 % (ref 0.0–0.2)
Neutro Abs: 4.9 10*3/uL (ref 1.7–7.7)
Neutrophils Relative %: 86 %
PLATELETS: 56 10*3/uL — AB (ref 150–400)
RBC: 3.67 MIL/uL — AB (ref 3.87–5.11)
RDW: 18.7 % — AB (ref 11.5–15.5)
WBC: 5.6 10*3/uL (ref 4.0–10.5)

## 2018-07-26 LAB — RENAL FUNCTION PANEL
ALBUMIN: 1.4 g/dL — AB (ref 3.5–5.0)
Anion gap: 8 (ref 5–15)
BUN: 23 mg/dL — AB (ref 6–20)
CO2: 23 mmol/L (ref 22–32)
Calcium: 8 mg/dL — ABNORMAL LOW (ref 8.9–10.3)
Chloride: 109 mmol/L (ref 98–111)
Creatinine, Ser: 2.72 mg/dL — ABNORMAL HIGH (ref 0.44–1.00)
GFR calc Af Amer: 22 mL/min — ABNORMAL LOW (ref >60)
GFR calc non Af Amer: 19 mL/min — ABNORMAL LOW (ref >60)
GLUCOSE: 161 mg/dL — AB (ref 70–99)
PHOSPHORUS: 2.9 mg/dL (ref 2.5–4.6)
POTASSIUM: 4.2 mmol/L (ref 3.5–5.1)
Sodium: 140 mmol/L (ref 135–145)

## 2018-07-26 LAB — GLUCOSE, CAPILLARY
GLUCOSE-CAPILLARY: 31 mg/dL — AB (ref 70–99)
GLUCOSE-CAPILLARY: 89 mg/dL (ref 70–99)
Glucose-Capillary: 187 mg/dL — ABNORMAL HIGH (ref 70–99)
Glucose-Capillary: 182 mg/dL — ABNORMAL HIGH (ref 70–99)
Glucose-Capillary: 138 mg/dL — ABNORMAL HIGH (ref 70–99)

## 2018-07-26 LAB — HEMOGLOBIN A1C
Hgb A1c MFr Bld: 6.2 % — ABNORMAL HIGH (ref 4.8–5.6)
Mean Plasma Glucose: 131.24 mg/dL

## 2018-07-26 LAB — TSH: TSH: 4.643 u[IU]/mL — ABNORMAL HIGH (ref 0.350–4.500)

## 2018-07-26 LAB — TYPE AND SCREEN
ABO/RH(D): O POS
Antibody Screen: NEGATIVE

## 2018-07-26 LAB — TACROLIMUS LEVEL: Tacrolimus (FK506) - LabCorp: 10.1 ng/mL (ref 2.0–20.0)

## 2018-07-26 MED ORDER — SODIUM CHLORIDE 0.9% IV SOLUTION: Freq: Once | INTRAVENOUS | Status: DC

## 2018-07-26 MED ORDER — HYDRALAZINE HCL 20 MG/ML IJ SOLN
10.0000 mg | Freq: Four times a day (QID) | INTRAMUSCULAR | Status: DC | PRN
  Administered 2018-07-26 (×2): 10 mg via INTRAVENOUS
  Filled 2018-07-26 (×2): qty 1

## 2018-07-26 MED ORDER — CHLORHEXIDINE GLUCONATE 4 % EX LIQD
60.0000 mL | Freq: Once | CUTANEOUS | Status: AC
  Administered 2018-07-27: 4 via TOPICAL
  Filled 2018-07-26: qty 60

## 2018-07-26 MED ORDER — NALOXONE HCL 0.4 MG/ML IJ SOLN
0.2000 mg | Freq: Once | INTRAMUSCULAR | Status: AC
  Administered 2018-07-26: 0.2 mg via INTRAVENOUS
  Filled 2018-07-26: qty 1

## 2018-07-26 MED ORDER — ROMIPLOSTIM 250 MCG ~~LOC~~ SOLR
1.0000 ug/kg | Freq: Once | SUBCUTANEOUS | Status: AC
  Administered 2018-07-26: 70 ug via SUBCUTANEOUS
  Filled 2018-07-26: qty 0.14

## 2018-07-26 MED ORDER — DEXTROSE-NACL 5-0.45 % IV SOLN
INTRAVENOUS | Status: AC
  Administered 2018-07-26 (×2): via INTRAVENOUS

## 2018-07-26 MED FILL — Fentanyl Citrate Preservative Free (PF) Inj 100 MCG/2ML: INTRAMUSCULAR | Qty: 2 | Status: AC

## 2018-07-26 NOTE — Progress Notes (Addendum)
PROGRESS NOTE    MIRRA BASILIO  ZOX:[id number] DOB: 1966/08/14 DOA: 07/30/2018 PCP: Dois Davenport, MD   Brief Narrative: Mrs. Ibanez is a 52 y.o. 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  Discussed with Orthopedics, ID and Nephrology here and transferred for plan for possible AKA.  Patient stabilized in stepdown unit and transferred to my care on 07/26/2018.  She received an late last night and is somewhat more somnolent than she was before.  No focal deficits.   Assessment & Plan:  Acute sepsis present on admission secondary to osteomyelitis of the right BKA stump.  Currently on combination of daptomycin and Rocephin, ID and orthopedics on board.  Case discussed with Dr. Lajoyce Corners on 07/26/2018.  He will proceed with right AKA tomorrow. Will to keep platelets around 100,000, Nplate given on 81/19/1478 will transfuse 1 unit of 1 AM on 07/24/2018 and repeat CBC 5 AM.   Thrombocytopenia - could be due to sepsis, etiology unclear, hematology following.  HIT panel only elevated but doubt it is HIT, SRA levels are pending, currently platelets are stable, Nplate to be given on 07/26/2018 followed by 1 unit of platelet transfusion early morning 07/23/2018 in preparation for AKA.  Appreciate hematology follow-up for following.  Does not appear to be consistent with TTP.  Was on Zyvox prior to this hospitalization which could cause thrombocytopenia as well.  Acute metabolic encephalopathy - secondary to combination of ESRD, sepsis and sedative medications.  Discontinued narcotics, Ativan, Seroquel and Catapres patch.  Head CT nonacute, EEG does not show any seizures, no focal deficits.  Supportive care and monitor.   Anemia, of chronic kidney disease  -  Hgb on admission 8.9 g/dL consistent with  patient's baseline anemia of chronic kidney disease.  It did drop to 6.5g/dL with fluid resuscitation, was transfused.  Monitor H&H.  Acute kidney injury and chronic kidney disease stage IV with  Hypernatremia & history of kidney transplant-  Recent baseline Cr 2.4, worse up to 2.9 on admission, worsening since.  UA on admission showed negligible RBCs, WBCs TNTC.  Continue her immunosuppressants for renal following.  Renal function stable.  Continue gentle hydration.   Hypertension -   placed on hydralazine as needed, discontinue Catapres patch as that can be causing some somnolence as well.  Monitor.  Once oral intake is better place her on combination of Coreg and Norvasc.  Stage II, sacrum, present on admission - W care.  Bipolar - Hold Abilify, seroquel  and mirtazapine  Hypothyroidism - Continue levothyroxine  DM type II - currently poor oral intake due to encephalopathy, gentle D5 drip, sliding scale insulin, check A1c.  Hold Lantus.  D10 drip.  CBG (last 3)  Recent Labs    07/25/18 1640 07/25/18 2125 07/26/18 0800  GLUCAP 120* 108* 187*        MDM and disposition: The below labs and imaging reports were reviewed and summarized above.  Medication management as above.  The patient was admitted with confusion, found to have osteo, acidosis, worsenign renal function, and new thrombocytopenia.   DVT prophylaxis: SCDs Code Status: FULL Family Communication: None present  Consultants:   Hematology  Nephrology  Orthopedics  Procedures:     Antimicrobials:   Linezolid prior to admission  Ceftriaxone 11/12 >>  Daptomycin 11/12 >>    Subjective:  Appears nontoxic but somnolent, received Ativan last night, will open eyes to painful stimuli denies any headache, moving all 4 extremities to painful stimuli, unreliable historian, denies any headache but does not talk when prompted except to painful stimuli and loud commands at times.  Objective: Vitals:   07/25/18  2200 07/25/18 2300 07/26/18 0259 07/26/18 0803  BP: (!) 153/85 (!) 153/91 (!) 150/95 (!) 155/91  Pulse: 85 86 88 92  Resp: 15 (!) 31 14 12   Temp:  (!) 97.5 F (36.4 C)  97.6 F (36.4 C)  TempSrc:  Axillary  Rectal  SpO2: 100% 100% 100% 100%  Weight:      Height:        Intake/Output Summary (Last 24 hours) at 07/26/2018 1130 Last data filed at 07/26/2018 0602 Gross per 24 hour  Intake 1080.95 ml  Output 1175 ml  Net -94.05 ml   Filed Weights   07/23/18 0440 07/24/18 0500 07/25/18 0500  Weight: 63 kg 64.1 kg 70.4 kg    Examination:  Patient in bed somnolent, responds to painful stimuli moves all 4 extremities, denies any headache, does not appear toxic [place].AT,PERRAL Supple Neck,No JVD, No cervical lymphadenopathy appriciated.  Symmetrical Chest wall movement, Good air movement bilaterally, CTAB RRR,No Gallops, Rubs or new Murmurs, No Parasternal Heave +ve B.Sounds, Abd Soft, No tenderness, No organomegaly appriciated, No rebound - guarding or rigidity. No Cyanosis, Clubbing or edema, right BKA stump under bandage   Data Reviewed: I have personally reviewed following labs and imaging studies:  CBC: Recent Labs  Lab 2018-08-15 1801 07/23/18 0435 07/23/18 1059 07/24/18 0429 07/25/18 0448 07/26/18 0424  WBC 8.4 9.5  --  5.4 5.8 5.6  NEUTROABS  --   --   --  4.7 4.8 4.9  HGB 10.4* 10.2*  --  8.5* 8.2* 9.9*  HCT 33.7* 33.9*  --  28.1* 27.6* 32.9*  MCV 88.9 88.1  --  87.5 90.8 89.6  PLT 69* 65* 55* 46* 45* 56*   Basic Metabolic Panel: Recent Labs  Lab 2018/08/15 1801 07/23/18 0435 07/24/18 0429 07/25/18 0448 07/26/18 0424  NA 146* 145 147* 142 140  K 4.4 4.1 3.8 3.0* 4.2  CL 122* 119* 112* 108 109  CO2 18* 19* 25 29 23   GLUCOSE 60* 140* 286* 89 161*  BUN 38* 35* 30* 26* 23*  CREATININE 3.31* 3.23* 3.23* 2.84* 2.72*  CALCIUM 8.9 8.5* 7.9* 7.6* 8.0*  MG  --   --   --  1.7  --   PHOS 3.1  --   --  2.9 2.9   GFR: Estimated Creatinine Clearance: 24.7 mL/min (A)  (by C-G formula based on SCr of 2.72 mg/dL (H)). Liver Function Tests: Recent Labs  Lab 2018-08-15 1801 07/23/18 1059 07/25/18 0448 07/26/18 0424  AST  --  16  --   --   ALT  --  16  --   --   ALKPHOS  --  124  --   --   BILITOT  --  0.7  --   --   PROT  --  5.0*  --   --   ALBUMIN 1.5* 1.4* 1.4* 1.4*   No results for input(s): LIPASE, AMYLASE in the last 168 hours. No results for input(s): AMMONIA in the last 168 hours. Coagulation Profile: Recent Labs  Lab 07/23/18 1059 07/25/18 0448  INR 1.41 1.28   Cardiac Enzymes: Recent Labs  Lab 07/23/18 0435  CKTOTAL 21*  BNP (last 3 results) No results for input(s): PROBNP in the last 8760 hours. HbA1C: No results for input(s): HGBA1C in the last 72 hours. CBG: Recent Labs  Lab 07/25/18 1119 07/25/18 1547 07/25/18 1640 07/25/18 2125 07/26/18 0800  GLUCAP 99 56* 120* 108* 187*   Lipid Profile: No results for input(s): CHOL, HDL, LDLCALC, TRIG, CHOLHDL, LDLDIRECT in the last 72 hours. Thyroid Function Tests: No results for input(s): TSH, T4TOTAL, FREET4, T3FREE, THYROIDAB in the last 72 hours. Anemia Panel: Recent Labs    07/25/18 1103  FERRITIN 305  TIBC NOT CALCULATED  IRON 48   Urine analysis:    Component Value Date/Time   COLORURINE YELLOW 01/04/2018 1341   APPEARANCEUR CLOUDY (A) 01/04/2018 1341   LABSPEC 1.015 01/04/2018 1341   PHURINE 6.0 01/04/2018 1341   GLUCOSEU >=500 (A) 01/04/2018 1341   HGBUR LARGE (A) 01/04/2018 1341   BILIRUBINUR NEGATIVE 01/04/2018 1341   KETONESUR 20 (A) 01/04/2018 1341   PROTEINUR 100 (A) 01/04/2018 1341   UROBILINOGEN 0.2 03/02/2015 1153   NITRITE NEGATIVE 01/04/2018 1341   LEUKOCYTESUR MODERATE (A) 01/04/2018 1341   Sepsis Labs: @LABRCNTIP (procalcitonin:4,lacticacidven:4)  ) Recent Results (from the past 240 hour(s))  MRSA PCR Screening     Status: None   Collection Time: 08/07/18  2:28 PM  Result Value Ref Range Status   MRSA by PCR NEGATIVE NEGATIVE Final     Comment:        The GeneXpert MRSA Assay (FDA approved for NASAL specimens only), is one component of a comprehensive MRSA colonization surveillance program. It is not intended to diagnose MRSA infection nor to guide or monitor treatment for MRSA infections. Performed at [facility], [address], [place], [place] [address]      Radiology Studies: Ct Head Wo Contrast  Result Date: 07/25/2018 CLINICAL DATA:  52 year old female with altered level of consciousness. EXAM: CT HEAD WITHOUT CONTRAST TECHNIQUE: Contiguous axial images were obtained from the base of the skull through the vertex without intravenous contrast. COMPARISON:  07/19/2018 FINDINGS: Motif shin artifact decreases sensitivity despite rescanning. Brain: No evidence of acute infarction, hemorrhage, hydrocephalus, extra-axial collection or mass lesion/mass effect. Mild atrophy and chronic small-vessel white matter ischemic changes again noted. Vascular: Vertebral and carotid atherosclerotic calcifications noted. Skull: No acute abnormality. Sinuses/Orbits: No acute abnormality. Mucoperiosteal thickening of the paranasal sinuses again noted. Other: None IMPRESSION: 1. No evidence of acute intracranial abnormality 2. Mild atrophy and chronic small-vessel white matter ischemic changes 3. Chronic paranasal sinus disease. Electronically Signed   By: Harmon Pier M.D.   On: 07/25/2018 12:01     Scheduled Meds: . Chlorhexidine Gluconate Cloth  6 each Topical Daily  . Gerhardt's butt cream   Topical BID  . insulin aspart  0-15 Units Subcutaneous TID WC  . insulin aspart  0-5 Units Subcutaneous QHS  . levothyroxine  66 mcg Intravenous Daily  . nystatin   Topical BID  . predniSONE  5 mg Oral Q breakfast  . romiPLOStim  1 mcg/kg Subcutaneous Once  . sodium chloride flush  10-40 mL Intracatheter Q12H  . tacrolimus  1 mg Oral QPC breakfast   Continuous Infusions: . cefTRIAXone (ROCEPHIN)  IV Stopped (07/25/18 1627)  .  DAPTOmycin (CUBICIN)  IV    . dextrose 5 % and 0.45% NaCl       LOS: 4 days    Time spent: 30 mins  Signature  Susa Raring M.D on 07/26/2018 at 11:30 AM   -  To page  go to [email or web address] - password [email or web address]

## 2018-07-26 NOTE — Progress Notes (Signed)
Patient ID: [name], female   DOB: 10/29/1965, 52 y.o.   MRN: [id number] Patient is seen in follow-up for dehiscence right transtibial amputation.  Examination of the left foot her foot and leg are stable, no ulcers status post great toe amputation no swelling no signs of infection.  Examination of the right transtibial amputation she has dehiscence exposed bone with drainage.  I discussed with patient plan to proceed tomorrow with a above-the-knee amputation on the right. 

## 2018-07-26 NOTE — Care Management Note (Addendum)
Case Management Note  Patient Details  Name: [name] MRN: [id number] Date of Birth: [birth year]  Subjective/Objective:     AMS / hallucination / necrosis / dehiscence of her right BKA stump, osteomyelitis. Hx of renal transplant, CKD III, IDDM and anemia. Pt from [facility].    [name] (Spouse)     [phone] ([id number])  [phone] (H)      2018/03/08 @ 10:12 am  Plans noted for AKA today by Dr. Lajoyce Cornersuda.   Action/Plan: Per PT's evaluation: SNF....CSW aware of potential need. PTA pt from  [facility]. NCM will continue to monitor [facility]  Expected Discharge Date:                  Expected Discharge Plan:  Skilled Nursing Facility  In-House Referral:  Clinical Social Work  Discharge planning Services  CM Consult  Post Acute Care Choice:    Choice offered to:     DME Arranged:    DME Agency:     HH Arranged:    HH Agency:     Status of Service:  In process, will continue to follow  If discussed at Long Length of Stay Meetings, dates discussed:    Additional Comments:  Epifanio LeschesCole, Adajah Cocking Hudson, RN 07/26/2018, 1:06 PM

## 2018-07-26 NOTE — Progress Notes (Signed)
Physical Therapy Treatment Patient Details Name: [name] MRN: [id number] DOB: 1966-02-10 Today's Date: 07/26/2018    History of Present Illness 52 y.o. 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  Initially, time spent trying to wake her up.  Peri care noxious enough to start her arousal, but not enough for her to participate.  Emphasis on P/AAROM to Bil LE with stress on R LE.   Follow Up Recommendations  SNF;Supervision/Assistance - 24 hour     Equipment Recommendations  Other (comment)(TBD)    Recommendations for Other Services       Precautions / Restrictions Precautions Precautions: Fall Precaution Comments: wound and skin issues    Mobility  Bed Mobility Overal bed mobility: Needs Assistance Bed Mobility: Rolling Rolling: Mod assist         General bed mobility comments: multiple rolls and pt working to position herself in prone.  Transfers                 General transfer comment: unable to perform  Ambulation/Gait                 Stairs             Wheelchair Mobility    Modified Rankin (Stroke Patients Only)       Balance                                            Cognition Arousal/Alertness: Lethargic Behavior During Therapy: Restless Overall Cognitive Status: Impaired/Different from baseline                                 General Comments: pt took the whole treatment with stool clean up, rolling and adding various noxious stimuli to arouse enough to mumble her complaints.      Exercises Other Exercises Other Exercises: P/AAROM to bil LE's with emphasis on R leg and specifically R knee. in flexion and ext.   And hips in flexion  and extension.    General Comments        Pertinent Vitals/Pain Pain Assessment: Faces Faces Pain Scale: Hurts even more Pain Location: right stump Pain Descriptors / Indicators: Discomfort;Grimacing;Guarding;Moaning Pain Intervention(s): Limited activity within patient's tolerance    Home Living                      Prior Function            PT Goals (current goals can now be found in the care plan section) Acute Rehab PT Goals Patient Stated Goal: none stated PT Goal Formulation: Patient unable to participate in goal setting Time For Goal Achievement: 08/06/18 Potential to Achieve Goals: Fair Progress towards PT goals: Not progressing toward goals - comment(not waking up as expected.)    Frequency    Min 2X/week      PT Plan Current plan remains appropriate    Co-evaluation              AM-PAC PT "6 Clicks" Daily Activity  Outcome Measure  Difficulty turning over in bed (including adjusting bedclothes, sheets and blankets)?: Unable Difficulty moving from lying on back to sitting on the side of the bed? : Unable Difficulty sitting down on and standing up from a chair with arms (e.g., wheelchair, bedside commode, etc,.)?: Unable Help needed moving to and from a bed to chair (including a wheelchair)?: Total Help needed walking in hospital room?: Total Help needed climbing 3-5 steps with a railing? : Total 6 Click Score: 6    End of Session   Activity Tolerance: Patient limited by lethargy Patient left: in bed;with call bell/phone within reach;with bed alarm set Nurse Communication: Mobility status PT Visit Diagnosis: Muscle weakness (generalized) (M62.81);Other symptoms and signs involving the nervous system (R29.898)     Time: [id number] PT Time Calculation (min) (ACUTE ONLY): 32 min  Charges:  $Therapeutic Exercise: 8-22 mins $Therapeutic Activity: 8-22 mins                     07/26/2018  [place] BingKen Kariel Skillman, PT Acute Rehabilitation  Services [phone]  (pager) [phone]  (office)   Eliseo GumKenneth V Charvi Gammage 07/26/2018, 12:43 PM

## 2018-07-26 NOTE — Progress Notes (Signed)
[facility]  Date of Admission:  07/15/2018     Total days of antibiotics 8         ASSESSMENT/PLAN  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 Lajoyce Cornersuda tomorrow. Appears to be tolerating the daptomycin and ceftriaxone with no adverse side effects.  She remains afebrile with stable WBC count. Clinically she is very lethargic today and does not arouse easily.  1. Continue ceftriaxone and daptomycin pending AKA by Dr. Lajoyce Cornersuda tomorrow. 2. Continue to monitor fever curve and WBC count.    Active Problems:   Severe protein-calorie malnutrition (HCC)   Osteomyelitis (HCC)   Pressure injury of skin   Diabetic polyneuropathy associated with type 1 diabetes mellitus (HCC)   Dehiscence of amputation stump (HCC)   . [START ON 2017/11/23] chlorhexidine  60 mL Topical Once  . Chlorhexidine Gluconate Cloth  6 each Topical Daily  . Gerhardt's butt cream   Topical BID  . insulin aspart  0-15 Units Subcutaneous TID WC  . insulin aspart  0-5 Units Subcutaneous QHS  . levothyroxine  66 mcg Intravenous Daily  . nystatin   Topical BID  . predniSONE  5 mg Oral Q breakfast  . tacrolimus  1 mg Oral QPC breakfast    SUBJECTIVE:  Afebrile without leukocytosis overnight. Dr. Lajoyce Cornersuda planning for AKA tomorrow.   Minimally arousable and lethargic.   Allergies  Allergen Reactions  . Heparin     Heparin antibody positive; SRA pending  . Ace Inhibitors Cough  . Adhesive [Tape] Itching and Other (See Comments)    Please use paper tape  . Latex Itching  . Lisinopril Cough     Review of Systems: Review of Systems  Unable to perform ROS: Acuity of condition      OBJECTIVE: Vitals:   07/26/18 0259 07/26/18 0803 07/26/18 1139 07/26/18 1300  BP: (!) 150/95 (!) 155/91 (!) 151/88   Pulse: 88 92 87   Resp: 14 12 18    Temp:  97.6 F (36.4 C)  (!) 97.5 F (36.4 C)  TempSrc:   Rectal  Rectal  SpO2: 100% 100% 100%   Weight:      Height:       Body mass index is 23.6 kg/m.  Physical Exam  Constitutional: She appears well-developed. She appears lethargic. She has a sickly appearance. She appears ill. No distress.  Cardiovascular: Normal rate, regular rhythm, normal heart sounds and intact distal pulses. Exam reveals no gallop and no friction rub.  No murmur heard. Pulmonary/Chest: Effort normal and breath sounds normal. No stridor. No respiratory distress. She has no wheezes. She has no rales. She exhibits no tenderness.  Musculoskeletal:  Right BKA with dehiscence of previous surgical site.   Neurological: She appears lethargic.  Skin: Skin is warm and dry.    Lab Results Lab Results  Component Value Date   WBC 5.6 07/26/2018   HGB 9.9 (L) 07/26/2018   HCT 32.9 (L) 07/26/2018   MCV 89.6 07/26/2018   PLT 56 (L) 07/26/2018    Lab Results  Component Value Date   CREATININE 2.72 (H) 07/26/2018   BUN 23 (H) 07/26/2018   NA 140 07/26/2018   K 4.2 07/26/2018   CL 109 07/26/2018   CO2 23 07/26/2018    Lab Results  Component Value Date   ALT 16 07/23/2018   AST 16 07/23/2018   ALKPHOS 124  07/23/2018   BILITOT 0.7 07/23/2018     Microbiology: Recent Results (from the past 240 hour(s))  MRSA PCR Screening     Status: None   Collection Time: 07/11/2018  2:28 PM  Result Value Ref Range Status   MRSA by PCR NEGATIVE NEGATIVE Final    Comment:        The GeneXpert MRSA Assay (FDA approved for NASAL specimens only), is one component of a comprehensive MRSA colonization surveillance program. It is not intended to diagnose MRSA infection nor to guide or monitor treatment for MRSA infections. Performed at [facility], [address], [place], [place] [address]      Marcos Eke, NP [facility] [phone] Pager  07/26/2018  2:57 PM

## 2018-07-26 NOTE — H&P (View-Only) (Signed)
Patient ID: [name], female   DOB: 1965/11/19, 52 y.o.   MRN: [id number] Patient is seen in follow-up for dehiscence right transtibial amputation.  Examination of the left foot her foot and leg are stable, no ulcers status post great toe amputation no swelling no signs of infection.  Examination of the right transtibial amputation she has dehiscence exposed bone with drainage.  I discussed with patient plan to proceed tomorrow with a above-the-knee amputation on the right.

## 2018-07-26 NOTE — Progress Notes (Signed)
Patient ID: [name], female   DOB: Jun 14, 1966, 52 y.o.   MRN: [id number] [facility] Progress Note   Assessment/ Plan:   1.  Acute kidney injury on chronic kidney disease stage III T: This appears to be from volume depletion/ATN associated with possible sepsis-nonoliguric at this time with creatinine gradually trending down towards her baseline and decent urine output noted.  She does not have any uremic symptoms and volume status appears acceptable. 2.  Status post deceased donor kidney transplant: Continue ongoing chronic immunosuppressive therapy with Prograf 1 mg daily and prednisone 5 mg daily-Prograf level pending. 3.  Osteomyelitis of right BKA stump with sepsis: Afebrile on coverage with daptomycin and ceftriaxone.  Plans noted for AKA tomorrow by Dr. Lajoyce Cornersuda. 4.  Encephalopathy: She remains largely lethargic with minimal verbal response to questions-able to protect airway.  Suspect that this is largely in part from underlying osteomyelitis/SIRS. 5.  Thrombocytopenia: Suspected to be associated with chronic inflammation/medications-no evidence of TTP/DIC.  Appreciate input from hematology.  Platelet transfusion to optimize for surgery. 6.  Anemia: Without overt blood loss, suspect anemia from underlying chronic kidney disease as well as critical illness/malnutrition-inflammatory complex with ongoing osteomyelitis.  She would likely need PRBC transfusion postop.  Subjective:   Without acute events noted overnight following transfer to telemetry-patient unable to verbally respond to questions.   Objective:   BP (!) 151/88   Pulse 87   Temp 97.6 F (36.4 C) (Rectal)   Resp 18   Ht 5\' 8"  (1.727 m)   Wt 70.4 kg   LMP 11/19/2011 Comment: pt had tubal ligation  SpO2 100%   BMI 23.60 kg/m   Intake/Output Summary (Last 24 hours) at 07/26/2018 1207 Last data filed at 07/26/2018 0602 Gross per 24 hour  Intake 844.49 ml  Output 1175 ml  Net -330.51 ml   Weight  change:   Physical Exam: Gen: Appears to be uncomfortable resting in bed-no verbal responses to questions CVS: Pulse regular rhythm, normal rate, S1 and S2 normal Resp: Clear to auscultation bilaterally, no distinct rales or rhonchi Abd: Soft, obese, nontender Ext: Right BKA stump in clean/dry dressing.  No left lower extremity edema.  Imaging: Ct Head Wo Contrast  Result Date: 07/25/2018 CLINICAL DATA:  52 year old female with altered level of consciousness. EXAM: CT HEAD WITHOUT CONTRAST TECHNIQUE: Contiguous axial images were obtained from the base of the skull through the vertex without intravenous contrast. COMPARISON:  07/19/2018 FINDINGS: Motif shin artifact decreases sensitivity despite rescanning. Brain: No evidence of acute infarction, hemorrhage, hydrocephalus, extra-axial collection or mass lesion/mass effect. Mild atrophy and chronic small-vessel white matter ischemic changes again noted. Vascular: Vertebral and carotid atherosclerotic calcifications noted. Skull: No acute abnormality. Sinuses/Orbits: No acute abnormality. Mucoperiosteal thickening of the paranasal sinuses again noted. Other: None IMPRESSION: 1. No evidence of acute intracranial abnormality 2. Mild atrophy and chronic small-vessel white matter ischemic changes 3. Chronic paranasal sinus disease. Electronically Signed   By: Harmon PierJeffrey  Hu M.D.   On: 07/25/2018 12:01    Labs: BMET Recent Labs  Lab 07/20/2018 1801 07/23/18 0435 07/24/18 0429 07/25/18 0448 07/26/18 0424  NA 146* 145 147* 142 140  K 4.4 4.1 3.8 3.0* 4.2  CL 122* 119* 112* 108 109  CO2 18* 19* 25 29 23   GLUCOSE 60* 140* 286* 89 161*  BUN 38* 35* 30* 26* 23*  CREATININE 3.31* 3.23* 3.23* 2.84* 2.72*  CALCIUM 8.9 8.5* 7.9* 7.6* 8.0*  PHOS 3.1  --   --  2.9  2.9   CBC Recent Labs  Lab 07/23/18 0435 07/23/18 1059 07/24/18 0429 07/25/18 0448 07/26/18 0424  WBC 9.5  --  5.4 5.8 5.6  NEUTROABS  --   --  4.7 4.8 4.9  HGB 10.2*  --  8.5* 8.2*  9.9*  HCT 33.9*  --  28.1* 27.6* 32.9*  MCV 88.1  --  87.5 90.8 89.6  PLT 65* 55* 46* 45* 56*    Medications:    . Chlorhexidine Gluconate Cloth  6 each Topical Daily  . Gerhardt's butt cream   Topical BID  . insulin aspart  0-15 Units Subcutaneous TID WC  . insulin aspart  0-5 Units Subcutaneous QHS  . levothyroxine  66 mcg Intravenous Daily  . nystatin   Topical BID  . predniSONE  5 mg Oral Q breakfast  . romiPLOStim  1 mcg/kg Subcutaneous Once  . tacrolimus  1 mg Oral QPC breakfast   Zetta Bills, MD 07/26/2018, 12:07 PM

## 2018-07-27 ENCOUNTER — Encounter (HOSPITAL_COMMUNITY): Admission: AD | Disposition: E | Payer: Self-pay | Source: Other Acute Inpatient Hospital | Attending: Family Medicine

## 2018-07-27 ENCOUNTER — Encounter (HOSPITAL_COMMUNITY): Payer: Self-pay | Admitting: Anesthesiology

## 2018-07-27 ENCOUNTER — Inpatient Hospital Stay (HOSPITAL_COMMUNITY): Payer: Medicare Other | Admitting: Anesthesiology

## 2018-07-27 LAB — CBC WITH DIFFERENTIAL/PLATELET
ABS IMMATURE GRANULOCYTES: 0.04 10*3/uL (ref 0.00–0.07)
BASOS PCT: 0 %
Basophils Absolute: 0 10*3/uL (ref 0.0–0.1)
EOS ABS: 0.2 10*3/uL (ref 0.0–0.5)
Eosinophils Relative: 2 %
HCT: 35.5 % — ABNORMAL LOW (ref 36.0–46.0)
HEMOGLOBIN: 10.9 g/dL — AB (ref 12.0–15.0)
Immature Granulocytes: 1 %
LYMPHS ABS: 1.1 10*3/uL (ref 0.7–4.0)
Lymphocytes Relative: 16 %
MCH: 27.1 pg (ref 26.0–34.0)
MCHC: 30.7 g/dL (ref 30.0–36.0)
MCV: 88.3 fL (ref 80.0–100.0)
Monocytes Absolute: 0.4 10*3/uL (ref 0.1–1.0)
Monocytes Relative: 6 %
NEUTROS PCT: 75 %
NRBC: 0 % (ref 0.0–0.2)
Neutro Abs: 5.2 10*3/uL (ref 1.7–7.7)
Platelets: 77 10*3/uL — ABNORMAL LOW (ref 150–400)
RBC: 4.02 MIL/uL (ref 3.87–5.11)
RDW: 18.3 % — ABNORMAL HIGH (ref 11.5–15.5)
WBC: 6.9 10*3/uL (ref 4.0–10.5)

## 2018-07-27 LAB — RENAL FUNCTION PANEL
ANION GAP: 8 (ref 5–15)
Albumin: 1.4 g/dL — ABNORMAL LOW (ref 3.5–5.0)
BUN: 22 mg/dL — ABNORMAL HIGH (ref 6–20)
CO2: 23 mmol/L (ref 22–32)
Calcium: 8.1 mg/dL — ABNORMAL LOW (ref 8.9–10.3)
Chloride: 108 mmol/L (ref 98–111)
Creatinine, Ser: 2.47 mg/dL — ABNORMAL HIGH (ref 0.44–1.00)
GFR calc non Af Amer: 21 mL/min — ABNORMAL LOW (ref >60)
GFR, EST AFRICAN AMERICAN: 25 mL/min — AB (ref >60)
Glucose, Bld: 76 mg/dL (ref 70–99)
POTASSIUM: 3.5 mmol/L (ref 3.5–5.1)
Phosphorus: 2.7 mg/dL (ref 2.5–4.6)
Sodium: 139 mmol/L (ref 135–145)

## 2018-07-27 LAB — GLUCOSE, CAPILLARY
GLUCOSE-CAPILLARY: 52 mg/dL — AB (ref 70–99)
GLUCOSE-CAPILLARY: 66 mg/dL — AB (ref 70–99)
GLUCOSE-CAPILLARY: 77 mg/dL (ref 70–99)
GLUCOSE-CAPILLARY: 87 mg/dL (ref 70–99)
Glucose-Capillary: 158 mg/dL — ABNORMAL HIGH (ref 70–99)

## 2018-07-27 LAB — SURGICAL PCR SCREEN
MRSA, PCR: NEGATIVE
STAPHYLOCOCCUS AUREUS: NEGATIVE

## 2018-07-27 SURGERY — AMPUTATION, ABOVE KNEE
Anesthesia: General | Laterality: Right

## 2018-07-27 MED ORDER — FENTANYL CITRATE (PF) 250 MCG/5ML IJ SOLN
INTRAMUSCULAR | Status: AC
  Filled 2018-07-27: qty 5

## 2018-07-27 MED ORDER — CEFAZOLIN SODIUM-DEXTROSE 2-4 GM/100ML-% IV SOLN: 2.0000 g | INTRAVENOUS | Status: DC

## 2018-07-27 NOTE — Progress Notes (Signed)
[facility]  [facility] Coordinator will follow pt with ID team to support Home Infusion Pharmacy servcies if needed at DC.     If patient discharges after hours, please call [phone].   Sedalia Mutaamela S Chandler 07/31/2018, 1:29 PM

## 2018-07-27 NOTE — Progress Notes (Signed)
Patient Demographics:    [name], is a 52 y.o. female, DOB - [birth year], ZOX:[id number]  Admit date - 07/30/2018   Admitting Physician Kendell Bane, MD  Outpatient Primary MD for the patient is Dois Davenport, MD  LOS - 5   No chief complaint on file.       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 05/11/18 admitted from [facility] on 07/30/2018 with toxic metabolic encephalopathy and right BKA wound dehiscence/infection and osteomyelitis , on daptomycin and ceftriaxone , awaiting Rt AKA once family members can be reached for consent     Plan:- 1)Sepsis secondary to Rt BKA wound Dehiscence and Osteomyelitis----infectious disease consult appreciated, continue IV Rocephin and daptomycin, plan is for right AKA, no fever or leukocytosis at this time  2)Toxic metabolic encephalopathy--- multifactorial, suspect secondary to AKI on CKD, as well as infection/osteomyelitis of the right BKA stump with sepsis  3)Acute on chronic Anemia--- at baseline patient has chronic anemia of CKD, EPO agent as per nephrologist, monitor H&H closely and transfuse as clinically indicated, hemoglobin is up to 10.9  4)S/p Deceased Donor Kidney TRansplant --- patient now with AKI superimposed on CKD 3 --- suspect due to dehydration/volume depletion continue tacrolimus and prednisone, nephrology input appreciated,  creatinine on admission= 3.3 ,    baseline creatinine = 2.4 to 2.6    , creatinine is now= 2.47     , renally adjust medications, avoid nephrotoxic agents/dehydration/hypotension   5)Thrombocytopenia--- status post transfusion of Nplate on 07/26/2018 and transfusion of 1 unit of platelets on 07/26/18, goal is to keep platelets above 100 k preop  Disposition/Need for in-Hospital Stay- patient unable to be discharged at this time due to needing IV antibiotics and revision of a right BKA to AKA due to sepsis/wound dehiscence/infection/osteomyelitis  Code Status : Full  Disposition Plan  : TBD  Consults  :  Ortho/ID/Nephrology   DVT Prophylaxis  :  ( SCDs on Lt), thrombocytopenia so no heparin  Lab Results  Component Value Date   PLT 77 (L) Aug 13, 2018    Inpatient Medications  Scheduled Meds: . [MAR Hold] Chlorhexidine Gluconate Cloth  6 each Topical Daily  . [MAR Hold] Gerhardt's butt cream   Topical BID  . [MAR Hold] insulin aspart  0-15 Units Subcutaneous TID WC  . [MAR Hold] insulin aspart  0-5 Units Subcutaneous QHS  . [MAR Hold] levothyroxine  66 mcg Intravenous Daily  . [MAR Hold] nystatin   Topical BID  . [MAR Hold] predniSONE  5 mg Oral Q breakfast  . [MAR Hold] tacrolimus  1 mg Oral QPC breakfast   Continuous Infusions: .  ceFAZolin (ANCEF) IV    . [MAR Hold] cefTRIAXone (ROCEPHIN)  IV 2 g (07/26/18 1759)  . [  MAR Hold] DAPTOmycin (CUBICIN)  IV 600 mg (07/26/18 2221)   PRN Meds:.[MAR Hold] acetaminophen **OR** [MAR Hold] acetaminophen, [MAR Hold] haloperidol lactate, [MAR Hold] hydrALAZINE, [MAR Hold] labetalol, [DISCONTINUED] ondansetron **OR** [MAR Hold] ondansetron (ZOFRAN) IV, [MAR Hold] polyethylene glycol, [MAR Hold] senna-docusate    Anti-infectives (From admission, onward)   Start     Dose/Rate Route Frequency Ordered Stop   07/28/2018 0745  ceFAZolin (ANCEF) IVPB 2g/100 mL premix     2 g 200 mL/hr over 30 Minutes Intravenous On call to O.R. 07/14/2018 0731 07/28/18 0559   07/26/18 2000  [MAR  Hold]  DAPTOmycin (CUBICIN) 600 mg in sodium chloride 0.9 % IVPB     (MAR Hold since Wed 07/10/2018 at 1315. Reason: Transfer to a Procedural area.)   600 mg 224 mL/hr over 30 Minutes Intravenous Every 48 hours 07/25/18 1145     08-09-18 2000  DAPTOmycin (CUBICIN) 300 mg in sodium chloride 0.9 % IVPB  Status:  Discontinued     300 mg 212 mL/hr over 30 Minutes Intravenous Every 48 hours 08-09-18 1543 07/25/18 1145   08-09-18 1800  [MAR Hold]  cefTRIAXone (ROCEPHIN) 2 g in sodium chloride 0.9 % 100 mL IVPB     (MAR Hold since Wed 08/06/2018 at 1315. Reason: Transfer to a Procedural area.)   2 g 200 mL/hr over 30 Minutes Intravenous Every 24 hours 08-09-18 1543          Objective:   Vitals:   07/26/18 2339 07/19/2018 0339 08/02/2018 0500 07/26/2018 0834  BP:  (!) 145/86  (!) 148/87  Pulse:  96    Resp:  13    Temp: (!) 97.5 F (36.4 C) 97.8 F (36.6 C)  97.9 F (36.6 C)  TempSrc:    Oral  SpO2:  100%    Weight:   76.2 kg   Height:        Wt Readings from Last 3 Encounters:  07/10/2018 76.2 kg  01/11/18 76.4 kg  01/01/18 83.9 kg     Intake/Output Summary (Last 24 hours) at 07/23/2018 1342 Last data filed at 07/26/2018 1500 Gross per 24 hour  Intake 316.03 ml  Output -  Net 316.03 ml     Physical Exam Patient is examined daily including today on 07/30/2018 , exams remain the same as of yesterday except that has changed   Gen:- Awake Alert, pleasantly confused, HEENT:- [place].AT, No sclera icterus Neck-Supple Neck,No JVD,.  Lungs-  CTAB , fair symmetrical air movement CV- S1, S2 normal, regular  Abd-  +ve B.Sounds, Abd Soft, No tenderness,    Extremity/Skin:-Right BKA stump with dressing over it, left foot with missing big toe Psych-affect is flat, pleasantly confused, oriented to self Neuro-no new focal deficits, no tremors   Data Review:   Micro Results Recent Results (from the past 240 hour(s))  MRSA PCR Screening     Status: None   Collection Time: 08-09-18  2:28 PM    Result Value Ref Range Status   MRSA by PCR NEGATIVE NEGATIVE Final    Comment:        The GeneXpert MRSA Assay (FDA approved for NASAL specimens only), is one component of a comprehensive MRSA colonization surveillance program. It is not intended to diagnose MRSA infection nor to guide or monitor treatment for MRSA infections. Performed at [facility], [address] [place], [place] [address]   Surgical pcr screen     Status: None   Collection Time: 07/13/2018  6:19 AM  Result Value Ref Range Status   MRSA, PCR NEGATIVE NEGATIVE Final   Staphylococcus aureus NEGATIVE NEGATIVE Final    Comment: (NOTE) The Xpert SA Assay (FDA approved for NASAL specimens in patients 36 years of age and older), is one component of a comprehensive surveillance program. It is not intended to diagnose infection nor to guide or monitor treatment. Performed at [facility], [address] [place], [place] [address]     Radiology Reports Ct Head Wo Contrast  Result Date: 07/25/2018 CLINICAL DATA:  52 year old female with altered level of consciousness. EXAM: CT HEAD WITHOUT CONTRAST TECHNIQUE: Contiguous axial images were obtained from the base of the skull through the vertex without intravenous contrast. COMPARISON:  07/19/2018 FINDINGS: Motif shin artifact decreases sensitivity despite rescanning. Brain: No evidence of acute infarction, hemorrhage, hydrocephalus, extra-axial collection or mass lesion/mass effect. Mild atrophy and chronic small-vessel white matter ischemic changes again noted. Vascular: Vertebral and carotid atherosclerotic calcifications noted. Skull: No acute abnormality. Sinuses/Orbits: No acute abnormality. Mucoperiosteal thickening of the paranasal sinuses again noted. Other: None IMPRESSION: 1. No evidence of acute intracranial abnormality 2. Mild atrophy and chronic small-vessel white matter ischemic changes 3. Chronic paranasal sinus disease. Electronically Signed    By: Harmon Pier M.D.   On: 07/25/2018 12:01     CBC Recent Labs  Lab 07/23/18 0435 07/23/18 1059 07/24/18 0429 07/25/18 0448 07/26/18 0424 07/28/2018 0620  WBC 9.5  --  5.4 5.8 5.6 6.9  HGB 10.2*  --  8.5* 8.2* 9.9* 10.9*  HCT 33.9*  --  28.1* 27.6* 32.9* 35.5*  PLT 65* 55* 46* 45* 56* 77*  MCV 88.1  --  87.5 90.8 89.6 88.3  MCH 26.5  --  26.5 27.0 27.0 27.1  MCHC 30.1  --  30.2 29.7* 30.1 30.7  RDW 19.7*  --  19.5* 19.5* 18.7* 18.3*  LYMPHSABS  --   --  0.5* 0.7 0.5* 1.1  MONOABS  --   --  0.2 0.3 0.2 0.4  EOSABS  --   --  0.0 0.0 0.0 0.2  BASOSABS  --   --  0.0 0.0 0.0 0.0    Chemistries  Recent Labs  Lab 07/23/18 0435 07/23/18 1059 07/24/18 0429 07/25/18 0448 07/26/18 0424 08/02/2018 0620  NA 145  --  147* 142 140 139  K 4.1  --  3.8 3.0* 4.2 3.5  CL 119*  --  112* 108 109 108  CO2 19*  --  25 29 23 23   GLUCOSE 140*  --  286* 89 161* 76  BUN 35*  --  30* 26* 23* 22*  CREATININE 3.23*  --  3.23* 2.84* 2.72* 2.47*  CALCIUM 8.5*  --  7.9* 7.6* 8.0* 8.1*  MG  --   --   --  1.7  --   --   AST  --  16  --   --   --   --   ALT  --  16  --   --   --   --   ALKPHOS  --  124  --   --   --   --   BILITOT  --  0.7  --   --   --   --    ------------------------------------------------------------------------------------------------------------------ No results for input(s): CHOL, HDL, LDLCALC, TRIG, CHOLHDL, LDLDIRECT in the last 72 hours.  Lab Results  Component Value Date   HGBA1C 6.2 (H) 07/26/2018   ------------------------------------------------------------------------------------------------------------------ Recent Labs    07/26/18 1150  TSH 4.643*   ------------------------------------------------------------------------------------------------------------------ Recent Labs    07/25/18 1103  FERRITIN 305  TIBC NOT CALCULATED  IRON 48    Coagulation profile Recent Labs  Lab 07/23/18 1059 07/25/18 0448  INR 1.41 1.28    No results for input(s):  DDIMER in the last 72 hours.  Cardiac Enzymes No results for input(s): CKMB, TROPONINI, MYOGLOBIN in the last 168 hours.  Invalid input(s): CK ------------------------------------------------------------------------------------------------------------------    Component Value Date/Time   BNP 958.8 (H) 11/14/2017 1313    Shon Hale M.D on 08/21/2018 at 1:42 PM  Pager---[phone] Go to [email or web address] - password [email or web address] for contact info  [facility] - Office  [phone]

## 2018-07-27 NOTE — Progress Notes (Signed)
Patient ID: [name], female   DOB: [birth year], 52 y.o.   MRN: [id number] [facility] Progress Note   Assessment/ Plan:   1.  Acute kidney injury on chronic kidney disease stage III T: This appears to be from volume depletion/ATN associated with possible sepsis-urine output not charted for the last 24 hours however, labs with downtrending creatinine indicative of recovering allograft function.  She does not have any uremic symptoms and volume status appears acceptable. 2.  Status post deceased donor kidney transplant: Continue ongoing chronic immunosuppressive therapy with Prograf 1 mg daily and prednisone 5 mg daily-Prograf level pending. 3.  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  Plans noted for AKA today by Dr. Lajoyce Cornersuda. 4.  Encephalopathy-likely metabolic: Suspect that this is largely in part from underlying osteomyelitis/sepsis should improve with source control. 5.  Thrombocytopenia: Suspected to be associated with chronic inflammation/medications-no evidence of TTP/DIC.  Appreciate input from hematology.  Platelet transfusion as needed to optimize for surgery. 6.  Anemia: Without overt blood loss, suspect anemia from underlying chronic kidney disease as well as critical illness/malnutrition-inflammatory complex with ongoing osteomyelitis.  She will likely need PRBC transfusion postop.  Subjective:   Without acute events noted overnight.  She is a little more awake this morning but confused.   Objective:   BP (!) 148/87 (BP Location: Left Arm)   Pulse 96   Temp 97.9 F (36.6 C) (Oral)   Resp 13   Ht 5\' 8"  (1.727 m)   Wt 76.2 kg   LMP 11/19/2011 Comment: pt had tubal ligation  SpO2 100%   BMI 25.54 kg/m   Intake/Output Summary (Last 24 hours) at 08/03/2018 0949 Last data filed at 07/26/2018 1500 Gross per 24 hour  Intake 316.03 ml  Output -  Net 316.03 ml   Weight change:   Physical  Exam: Gen: Appears to be comfortable resting in bed-confused speech CVS: Pulse regular rhythm, normal rate, S1 and S2 normal Resp: Clear to auscultation bilaterally, no distinct rales or rhonchi Abd: Soft, obese, nontender Ext: Right BKA stump in clean/dry dressing.  No left lower extremity edema.  Imaging: Ct Head Wo Contrast  Result Date: 07/25/2018 CLINICAL DATA:  52 year old female with altered level of consciousness. EXAM: CT HEAD WITHOUT CONTRAST TECHNIQUE: Contiguous axial images were obtained from the base of the skull through the vertex without intravenous contrast. COMPARISON:  07/19/2018 FINDINGS: Motif shin artifact decreases sensitivity despite rescanning. Brain: No evidence of acute infarction, hemorrhage, hydrocephalus, extra-axial collection or mass lesion/mass effect. Mild atrophy and chronic small-vessel white matter ischemic changes again noted. Vascular: Vertebral and carotid atherosclerotic calcifications noted. Skull: No acute abnormality. Sinuses/Orbits: No acute abnormality. Mucoperiosteal thickening of the paranasal sinuses again noted. Other: None IMPRESSION: 1. No evidence of acute intracranial abnormality 2. Mild atrophy and chronic small-vessel white matter ischemic changes 3. Chronic paranasal sinus disease. Electronically Signed   By: Harmon PierJeffrey  Hu M.D.   On: 07/25/2018 12:01    Labs: BMET Recent Labs  Lab 2017-09-09 1801 07/23/18 0435 07/24/18 0429 07/25/18 0448 07/26/18 0424 07/31/2018 0620  NA 146* 145 147* 142 140 139  K 4.4 4.1 3.8 3.0* 4.2 3.5  CL 122* 119* 112* 108 109 108  CO2 18* 19* 25 29 23 23   GLUCOSE 60* 140* 286* 89 161* 76  BUN 38* 35* 30* 26* 23* 22*  CREATININE 3.31* 3.23* 3.23* 2.84* 2.72* 2.47*  CALCIUM 8.9 8.5* 7.9* 7.6* 8.0* 8.1*  PHOS 3.1  --   --  2.9 2.9 2.7   CBC Recent Labs  Lab 07/24/18 0429 07/25/18 0448 07/26/18 0424 07/13/2018 0620  WBC 5.4 5.8 5.6 6.9  NEUTROABS 4.7 4.8 4.9 5.2  HGB 8.5* 8.2* 9.9* 10.9*  HCT 28.1* 27.6*  32.9* 35.5*  MCV 87.5 90.8 89.6 88.3  PLT 46* 45* 56* 77*    Medications:    . Chlorhexidine Gluconate Cloth  6 each Topical Daily  . Gerhardt's butt cream   Topical BID  . insulin aspart  0-15 Units Subcutaneous TID WC  . insulin aspart  0-5 Units Subcutaneous QHS  . levothyroxine  66 mcg Intravenous Daily  . nystatin   Topical BID  . predniSONE  5 mg Oral Q breakfast  . tacrolimus  1 mg Oral QPC breakfast   Zetta Bills, MD 07/14/2018, 9:49 AM

## 2018-07-27 NOTE — Anesthesia Preprocedure Evaluation (Deleted)
Anesthesia Evaluation  Patient identified by MRN, date of birth, ID band Patient awake    Reviewed: Allergy & Precautions, NPO status , Patient's Chart, lab work & pertinent test results  Airway Mallampati: II  TM Distance: >3 FB     Dental  (+) Dental Advisory Given   Pulmonary former smoker,    breath sounds clear to auscultation       Cardiovascular hypertension, Pt. on medications and Pt. on home beta blockers +CHF   Rhythm:Regular Rate:Normal     Neuro/Psych  Neuromuscular disease    GI/Hepatic Neg liver ROS, GERD  ,  Endo/Other  diabetes, Insulin DependentHypothyroidism   Renal/GU CRFRenal disease     Musculoskeletal   Abdominal   Peds  Hematology  (+) Blood dyscrasia, anemia ,   Anesthesia Other Findings   Reproductive/Obstetrics                             Lab Results  Component Value Date   WBC 6.9 07/23/2018   HGB 10.9 (L) 07/22/2018   HCT 35.5 (L) 07/31/2018   MCV 88.3 07/17/2018   PLT 77 (L) 08/03/2018   Lab Results  Component Value Date   CREATININE 2.47 (H) 07/28/2018   BUN 22 (H) 07/25/2018   NA 139 07/09/2018   K 3.5 07/19/2018   CL 108 07/11/2018   CO2 23 08/02/2018    Anesthesia Physical Anesthesia Plan  ASA: IV  Anesthesia Plan: General   Post-op Pain Management:    Induction: Intravenous  PONV Risk Score and Plan: 3 and Dexamethasone, Ondansetron and Treatment may vary due to age or medical condition  Airway Management Planned: Oral ETT and LMA  Additional Equipment:   Intra-op Plan:   Post-operative Plan: Extubation in OR  Informed Consent: I have reviewed the patients History and Physical, chart, labs and discussed the procedure including the risks, benefits and alternatives for the proposed anesthesia with the patient or authorized representative who has indicated his/her understanding and acceptance.   Dental advisory given  Plan  Discussed with: CRNA  Anesthesia Plan Comments:         Anesthesia Quick Evaluation

## 2018-07-27 NOTE — Plan of Care (Signed)
Patient is alert oriented x 2. Patient has been NPO over the night  for procedure. No acute distress noted.

## 2018-07-27 NOTE — Interval H&P Note (Signed)
History and Physical Interval Note:  25-Aug-2018 6:53 AM  [name]  has presented today for surgery, with the diagnosis of dehiscence right below knee amputation  The various methods of treatment have been discussed with the patient and family. After consideration of risks, benefits and other options for treatment, the patient has consented to  Procedure(s): RIGHT ABOVE KNEE AMPUTATION (Right) as a surgical intervention .  The patient's history has been reviewed, patient examined, no change in status, stable for surgery.  I have reviewed the patient's chart and labs.  Questions were answered to the patient's satisfaction.     Nadara MustardMarcus V Duda

## 2018-07-27 NOTE — Progress Notes (Signed)
Unable to get in contact with any family member for consent.  Message left for husband.  Patient returned to floor in stable condition.

## 2018-07-28 ENCOUNTER — Telehealth (INDEPENDENT_AMBULATORY_CARE_PROVIDER_SITE_OTHER): Payer: Self-pay | Admitting: Orthopedic Surgery

## 2018-07-28 ENCOUNTER — Inpatient Hospital Stay: Payer: Self-pay

## 2018-07-28 LAB — CBC WITH DIFFERENTIAL/PLATELET
ABS IMMATURE GRANULOCYTES: 0.04 10*3/uL (ref 0.00–0.07)
Basophils Absolute: 0 10*3/uL (ref 0.0–0.1)
Basophils Relative: 0 %
EOS PCT: 1 %
Eosinophils Absolute: 0.1 10*3/uL (ref 0.0–0.5)
HEMATOCRIT: 30.6 % — AB (ref 36.0–46.0)
HEMOGLOBIN: 9.4 g/dL — AB (ref 12.0–15.0)
Immature Granulocytes: 1 %
LYMPHS ABS: 1.1 10*3/uL (ref 0.7–4.0)
LYMPHS PCT: 17 %
MCH: 26.9 pg (ref 26.0–34.0)
MCHC: 30.7 g/dL (ref 30.0–36.0)
MCV: 87.4 fL (ref 80.0–100.0)
MONO ABS: 0.4 10*3/uL (ref 0.1–1.0)
MONOS PCT: 6 %
Neutro Abs: 4.7 10*3/uL (ref 1.7–7.7)
Neutrophils Relative %: 75 %
Platelets: 70 10*3/uL — ABNORMAL LOW (ref 150–400)
RBC: 3.5 MIL/uL — ABNORMAL LOW (ref 3.87–5.11)
RDW: 18.2 % — ABNORMAL HIGH (ref 11.5–15.5)
WBC: 6.3 10*3/uL (ref 4.0–10.5)
nRBC: 0 % (ref 0.0–0.2)

## 2018-07-28 LAB — GLUCOSE, CAPILLARY
GLUCOSE-CAPILLARY: 104 mg/dL — AB (ref 70–99)
GLUCOSE-CAPILLARY: 70 mg/dL (ref 70–99)
GLUCOSE-CAPILLARY: 85 mg/dL (ref 70–99)
GLUCOSE-CAPILLARY: 89 mg/dL (ref 70–99)
Glucose-Capillary: 159 mg/dL — ABNORMAL HIGH (ref 70–99)

## 2018-07-28 LAB — RENAL FUNCTION PANEL
ALBUMIN: 1.5 g/dL — AB (ref 3.5–5.0)
ANION GAP: 8 (ref 5–15)
BUN: 21 mg/dL — ABNORMAL HIGH (ref 6–20)
CO2: 21 mmol/L — ABNORMAL LOW (ref 22–32)
Calcium: 7.7 mg/dL — ABNORMAL LOW (ref 8.9–10.3)
Chloride: 107 mmol/L (ref 98–111)
Creatinine, Ser: 2.5 mg/dL — ABNORMAL HIGH (ref 0.44–1.00)
GFR, EST AFRICAN AMERICAN: 25 mL/min — AB (ref >60)
GFR, EST NON AFRICAN AMERICAN: 21 mL/min — AB (ref >60)
Glucose, Bld: 145 mg/dL — ABNORMAL HIGH (ref 70–99)
PHOSPHORUS: 2.6 mg/dL (ref 2.5–4.6)
POTASSIUM: 4.1 mmol/L (ref 3.5–5.1)
Sodium: 136 mmol/L (ref 135–145)

## 2018-07-28 LAB — SEROTONIN RELEASE ASSAY (SRA)
SRA 100IU/mL UFH Ser-aCnc: 2 % (ref 0–20)
SRA, LOW DOSE HEPARIN: 2 % (ref 0–20)

## 2018-07-28 MED ORDER — LEVOTHYROXINE SODIUM 112 MCG PO TABS
112.0000 ug | ORAL_TABLET | Freq: Every day | ORAL | Status: DC
  Administered 2018-07-28 – 2018-07-30 (×3): 112 ug via ORAL
  Filled 2018-07-28 (×3): qty 1

## 2018-07-28 MED ORDER — SODIUM CHLORIDE 0.9% IV SOLUTION
Freq: Once | INTRAVENOUS | Status: AC
  Administered 2018-07-29: 02:00:00 via INTRAVENOUS

## 2018-07-28 MED ORDER — FUROSEMIDE 10 MG/ML IJ SOLN
20.0000 mg | Freq: Once | INTRAMUSCULAR | Status: AC
  Administered 2018-07-28: 20 mg via INTRAVENOUS
  Filled 2018-07-28: qty 2

## 2018-07-28 NOTE — Progress Notes (Signed)
IV team unable to get PIV multiple attempts last 24 hours.  Assessed with and attempt with ultrasound unsuccessfully.

## 2018-07-28 NOTE — Progress Notes (Signed)
Patient Demographics:    [name], is a 52 y.o. female, DOB - 1966-05-26, ZOX:[id number]  Admit date - 07/16/2018   Admitting Physician Kendell Bane, MD  Outpatient Primary MD for the patient is Dois Davenport, MD  LOS - 6   No chief complaint on file.       Subjective:    [name] today has no fevers, no emesis,  No chest pain, pleasantly confused, plan of care discussed with patient's husband at bedside today, questions answered, patient's husband agreed to sign consent for right AKA  Assessment  & Plan :    Active Problems:   Severe protein-calorie malnutrition (HCC)   Osteomyelitis (HCC)   Pressure injury of skin   Diabetic polyneuropathy associated with type 1 diabetes mellitus (HCC)   Dehiscence of amputation stump (HCC)  Brief Summary:- [name] is 52 yo female with h/o severe PAD, renal transplant on tac/ pred, CKD III,  H/o culture negative osteomyelitis of her previous right BKA 05/11/18 admitted from [facility] on 07/31/2018 with toxic metabolic encephalopathy and right BKA wound dehiscence/infection and osteomyelitis , on daptomycin and ceftriaxone , awaiting Rt AKA once family members can be reached for consent     Plan:- 1)Sepsis secondary to Rt BKA wound Dehiscence and Osteomyelitis----infectious disease consult appreciated, continue IV Rocephin and daptomycin, plan is for right AKA, no fever or leukocytosis at this time.  Orthopedic team was unable to obtain consent from family on August 18, 2018 show right AKA being rescheduled for 07/30/2018  2)Toxic metabolic encephalopathy--- multifactorial, suspect secondary to AKI on CKD, as well as infection/osteomyelitis of the right BKA stump with sepsis  3)Acute on chronic Anemia--- at baseline patient has chronic anemia of CKD, EPO agent as per nephrologist, monitor H&H closely and transfuse as clinically indicated,  hemoglobin is >9  4)S/p Deceased Donor Kidney TRansplant --- patient now with AKI superimposed on CKD 3 --- suspect due to dehydration/volume depletion continue tacrolimus and prednisone, nephrology input appreciated,  creatinine on admission= 3.3 ,   baseline creatinine = 2.4 to 2.6    , creatinine is now= 2.5    , renally adjust medications, avoid nephrotoxic agents/dehydration/hypotension   5)Thrombocytopenia--- status post transfusion of Nplate on 07/26/2018 and transfusion of 1 unit of platelets on 07/26/18, goal is to keep platelets above 100 k preop, will transfuse 1 unit of platelets overnight surgery is planned for 07/20/2018  Disposition/Need for in-Hospital Stay- patient unable to be discharged at this time due to needing IV antibiotics and revision of a right BKA to AKA due to sepsis/wound dehiscence/infection/osteomyelitis  Code Status : Full  Disposition Plan  : TBD  Consults  :  Ortho/ID/Nephrology   DVT Prophylaxis  :  ( SCDs on Lt), thrombocytopenia so no heparin  Lab Results  Component Value Date   PLT 70 (L) 07/28/2018    Inpatient Medications  Scheduled Meds: . Chlorhexidine Gluconate Cloth  6 each Topical Daily  . Gerhardt's butt cream   Topical BID  . insulin aspart  0-15 Units Subcutaneous TID WC  . insulin aspart  0-5 Units Subcutaneous QHS  . levothyroxine  112 mcg Oral QAC breakfast  . nystatin   Topical BID  . predniSONE  5 mg Oral Q breakfast  .  tacrolimus  1 mg Oral QPC breakfast   Continuous Infusions: . cefTRIAXone (ROCEPHIN)  IV 2 g (07/14/2018 1757)  . DAPTOmycin (CUBICIN)  IV 600 mg (07/26/18 2221)   PRN Meds:.acetaminophen **OR** acetaminophen, haloperidol lactate, hydrALAZINE, labetalol, [DISCONTINUED] ondansetron **OR** ondansetron (ZOFRAN) IV, polyethylene glycol, senna-docusate    Anti-infectives (From admission, onward)   Start     Dose/Rate Route Frequency Ordered Stop   07/11/2018 0745  ceFAZolin (ANCEF) IVPB 2g/100 mL premix  Status:   Discontinued     2 g 200 mL/hr over 30 Minutes Intravenous On call to O.R. 07/11/2018 0731 07/28/2018 1622   07/26/18 2000  DAPTOmycin (CUBICIN) 600 mg in sodium chloride 0.9 % IVPB     600 mg 224 mL/hr over 30 Minutes Intravenous Every 48 hours 07/25/18 1145     08/03/2018 2000  DAPTOmycin (CUBICIN) 300 mg in sodium chloride 0.9 % IVPB  Status:  Discontinued     300 mg 212 mL/hr over 30 Minutes Intravenous Every 48 hours 07/24/2018 1543 07/25/18 1145   07/14/2018 1800  cefTRIAXone (ROCEPHIN) 2 g in sodium chloride 0.9 % 100 mL IVPB     2 g 200 mL/hr over 30 Minutes Intravenous Every 24 hours 07/26/2018 1543          Objective:   Vitals:   07/28/18 0828 07/28/18 1228 07/28/18 1429 07/28/18 1628  BP: 136/78 (!) 141/84  (!) 151/88  Pulse: (!) 103 97    Resp: 19 20  13   Temp: (!) 97.5 F (36.4 C)  (!) 97.4 F (36.3 C)   TempSrc: Oral  Oral   SpO2: 100% 100%  100%  Weight:      Height:        Wt Readings from Last 3 Encounters:  07/10/2018 76.2 kg  01/11/18 76.4 kg  01/01/18 83.9 kg     Intake/Output Summary (Last 24 hours) at 07/28/2018 1746 Last data filed at 07/28/2018 1724 Gross per 24 hour  Intake -  Output 950 ml  Net -950 ml     Physical Exam Patient is examined daily including today on 07/28/18 , exams remain the same as of yesterday except that has changed   Gen:- Awake Alert, remains pleasantly confused, HEENT:- [place].AT, No sclera icterus Neck-Supple Neck,No JVD,.  Lungs-  CTAB , fair symmetrical air movement CV- S1, S2 normal, regular  Abd-  +ve B.Sounds, Abd Soft, No tenderness,    Extremity/Skin:-Right BKA stump with dressing over it, left foot with missing big toe Psych-affect is flat, pleasantly confused, oriented to self Neuro-no new focal deficits, no tremors   Data Review:   Micro Results Recent Results (from the past 240 hour(s))  MRSA PCR Screening     Status: None   Collection Time: 07/18/2018  2:28 PM  Result Value Ref Range Status   MRSA by PCR  NEGATIVE NEGATIVE Final    Comment:        The GeneXpert MRSA Assay (FDA approved for NASAL specimens only), is one component of a comprehensive MRSA colonization surveillance program. It is not intended to diagnose MRSA infection nor to guide or monitor treatment for MRSA infections. Performed at [facility], [address], [place], [place] [address]   Surgical pcr screen     Status: None   Collection Time: 08/06/2018  6:19 AM  Result Value Ref Range Status   MRSA, PCR NEGATIVE NEGATIVE Final   Staphylococcus aureus NEGATIVE NEGATIVE Final    Comment: (NOTE) The Xpert SA Assay (FDA approved  for NASAL specimens in patients 51 years of age and older), is one component of a comprehensive surveillance program. It is not intended to diagnose infection nor to guide or monitor treatment. Performed at [facility], [address] [place], [place] [address]     Radiology Reports Ct Head Wo Contrast  Result Date: 07/25/2018 CLINICAL DATA:  52 year old female with altered level of consciousness. EXAM: CT HEAD WITHOUT CONTRAST TECHNIQUE: Contiguous axial images were obtained from the base of the skull through the vertex without intravenous contrast. COMPARISON:  07/19/2018 FINDINGS: Motif shin artifact decreases sensitivity despite rescanning. Brain: No evidence of acute infarction, hemorrhage, hydrocephalus, extra-axial collection or mass lesion/mass effect. Mild atrophy and chronic small-vessel white matter ischemic changes again noted. Vascular: Vertebral and carotid atherosclerotic calcifications noted. Skull: No acute abnormality. Sinuses/Orbits: No acute abnormality. Mucoperiosteal thickening of the paranasal sinuses again noted. Other: None IMPRESSION: 1. No evidence of acute intracranial abnormality 2. Mild atrophy and chronic small-vessel white matter ischemic changes 3. Chronic paranasal sinus disease. Electronically Signed   By: Harmon PierJeffrey  Hu M.D.   On: 07/25/2018 12:01      CBC Recent Labs  Lab 07/24/18 0429 07/25/18 0448 07/26/18 0424 08/05/2018 0620 07/28/18 0457  WBC 5.4 5.8 5.6 6.9 6.3  HGB 8.5* 8.2* 9.9* 10.9* 9.4*  HCT 28.1* 27.6* 32.9* 35.5* 30.6*  PLT 46* 45* 56* 77* 70*  MCV 87.5 90.8 89.6 88.3 87.4  MCH 26.5 27.0 27.0 27.1 26.9  MCHC 30.2 29.7* 30.1 30.7 30.7  RDW 19.5* 19.5* 18.7* 18.3* 18.2*  LYMPHSABS 0.5* 0.7 0.5* 1.1 1.1  MONOABS 0.2 0.3 0.2 0.4 0.4  EOSABS 0.0 0.0 0.0 0.2 0.1  BASOSABS 0.0 0.0 0.0 0.0 0.0    Chemistries  Recent Labs  Lab 07/23/18 1059 07/24/18 0429 07/25/18 0448 07/26/18 0424 07/24/2018 0620 07/28/18 0457  NA  --  147* 142 140 139 136  K  --  3.8 3.0* 4.2 3.5 4.1  CL  --  112* 108 109 108 107  CO2  --  25 29 23 23  21*  GLUCOSE  --  286* 89 161* 76 145*  BUN  --  30* 26* 23* 22* 21*  CREATININE  --  3.23* 2.84* 2.72* 2.47* 2.50*  CALCIUM  --  7.9* 7.6* 8.0* 8.1* 7.7*  MG  --   --  1.7  --   --   --   AST 16  --   --   --   --   --   ALT 16  --   --   --   --   --   ALKPHOS 124  --   --   --   --   --   BILITOT 0.7  --   --   --   --   --    ------------------------------------------------------------------------------------------------------------------ No results for input(s): CHOL, HDL, LDLCALC, TRIG, CHOLHDL, LDLDIRECT in the last 72 hours.  Lab Results  Component Value Date   HGBA1C 6.2 (H) 07/26/2018   ------------------------------------------------------------------------------------------------------------------ Recent Labs    07/26/18 1150  TSH 4.643*   ------------------------------------------------------------------------------------------------------------------ No results for input(s): VITAMINB12, FOLATE, FERRITIN, TIBC, IRON, RETICCTPCT in the last 72 hours.  Coagulation profile Recent Labs  Lab 07/23/18 1059 07/25/18 0448  INR 1.41 1.28    No results for input(s): DDIMER in the last 72 hours.  Cardiac Enzymes No results for input(s): CKMB, TROPONINI, MYOGLOBIN in  the last 168 hours.  Invalid input(s): CK ------------------------------------------------------------------------------------------------------------------    Component  Value Date/Time   BNP 958.8 (H) 11/14/2017 1313    Shon Hale M.D on 07/28/2018 at 5:46 PM  Pager---[phone] Go to [email or web address] - password [email or web address] for contact info  [facility] - Office  [phone]

## 2018-07-28 NOTE — Progress Notes (Addendum)
Results for Exie ParodyCARROLL, Ivannia S (MRN [id number]) as of 07/28/2018 08:55  Ref. Range 07/19/2018 17:14 07/23/2018 22:28 07/15/2018 23:38 07/28/2018 00:25 07/28/2018 08:44  Glucose-Capillary Latest Ref Range: 70 - 99 mg/dL 119158 (H) 52 (L) 66 (L) 85 159 (H)  Noted that blood sugar last night was 52 mg/dl. Recommend decreasing Novolog correction scale to SENSITIVE (0-9 units) TID and HS scale if blood sugars continue to be less than 100 mg/dl.   Smith MinceKendra Sofya Moustafa RN BSN CDE Diabetes Coordinator Pager: [phone]  8am-5pm

## 2018-07-28 NOTE — Progress Notes (Signed)
Physical Therapy Treatment Patient Details Name: [name] MRN: [id number] DOB: 09-26-1965 Today's Date: 07/28/2018    History of Present Illness 52 y.o. F with hx renal transplant on tac, pred, CKD III baseline Cr 2.4 in May 2019, IDDM and anemia of CRI who presented to OSH 3 days before transfer here with AMS/hallucinations, found to have necrosis/dehiscence of her right BKA stump, osteomyelitis by MRI    PT Comments    More alert, still confused and needing lots of redirection to task   Follow Up Recommendations  SNF;Supervision/Assistance - 24 hour     Equipment Recommendations  Other (comment)(TBA)    Recommendations for Other Services       Precautions / Restrictions Precautions Precautions: Fall Precaution Comments: wound and skin issues    Mobility  Bed Mobility Overal bed mobility: Needs Assistance Bed Mobility: Rolling;Sidelying to Sit Rolling: Max assist Sidelying to sit: Max assist;+2 for physical assistance       General bed mobility comments: Cuing for technique and truncal/LE assist to come up with L UE hand over hand assist.  Transfers Overall transfer level: Needs assistance   Transfers: Squat Pivot Transfers     Squat pivot transfers: Max assist;+2 physical assistance     General transfer comment: pt's foot slid forward making it more difficult to assist her.  Ambulation/Gait             General Gait Details: unable to perform   Stairs             Wheelchair Mobility    Modified Rankin (Stroke Patients Only)       Balance                                            Cognition Arousal/Alertness: Lethargic Behavior During Therapy: Restless Overall Cognitive Status: Impaired/Different from baseline Area of Impairment: Orientation;Attention;Memory;Following commands;Safety/judgement;Awareness;Problem solving                 Orientation Level: Disoriented to;Place;Time;Situation Current  Attention Level: Sustained Memory: Decreased short-term memory Following Commands: Follows one step commands with increased time Safety/Judgement: Decreased awareness of safety;Decreased awareness of deficits Awareness: Intellectual Problem Solving: Slow processing General Comments: pt is still incontinent of stool      Exercises Other Exercises Other Exercises: Warm up AAROM to bil LE's    General Comments        Pertinent Vitals/Pain Pain Assessment: Faces Faces Pain Scale: Hurts even more Pain Location: right stump Pain Descriptors / Indicators: Discomfort;Grimacing;Guarding;Moaning Pain Intervention(s): Monitored during session    Home Living                      Prior Function            PT Goals (current goals can now be found in the care plan section) Acute Rehab PT Goals Patient Stated Goal: none stated PT Goal Formulation: Patient unable to participate in goal setting Time For Goal Achievement: 08/06/18 Potential to Achieve Goals: Fair Progress towards PT goals: Progressing toward goals    Frequency    Min 2X/week      PT Plan Current plan remains appropriate    Co-evaluation              AM-PAC PT "6 Clicks" Daily Activity  Outcome Measure  Difficulty turning over in bed (including adjusting bedclothes, sheets and  blankets)?: Unable Difficulty moving from lying on back to sitting on the side of the bed? : Unable Difficulty sitting down on and standing up from a chair with arms (e.g., wheelchair, bedside commode, etc,.)?: Unable Help needed moving to and from a bed to chair (including a wheelchair)?: A Lot Help needed walking in hospital room?: A Lot Help needed climbing 3-5 steps with a railing? : A Lot 6 Click Score: 9    End of Session   Activity Tolerance: Patient limited by pain Patient left: in chair;with call bell/phone within reach;with chair alarm set;Other (comment)(on lift padding) Nurse Communication: Mobility  status PT Visit Diagnosis: Muscle weakness (generalized) (M62.81);Other symptoms and signs involving the nervous system (R29.898)     Time: 1332-1401 PT Time Calculation (min) (ACUTE ONLY): 29 min  Charges:  $Therapeutic Activity: 23-37 mins                     07/28/2018  Bastrop BingKen Raoul Ciano, PT Acute Rehabilitation Services [phone]  (pager) [phone]  (office)   Eliseo GumKenneth V Timea Breed 07/28/2018, 6:16 PM

## 2018-07-28 NOTE — Progress Notes (Signed)
IV team attempted to get IV access for patient several times with no success. Bodenheimer, NP notified. PICC lin insert ordered. Per IV team RN, nephrology has to agree to PICC line insertion. Glenna FellowsKruska, MD paged. Patient not to get PICC line or midline insertion. Bodenheimer, NP notified. No thrill or bruit noted by this RN and Control and instrumentation engineerCassey, Charge RN to right arm AVF. Patient's husband states that AVF has not functioned since year 2011. Glenna FellowsKruska, MD notified and states that it is OK to use right arm for PIV. IV team notified.

## 2018-07-28 NOTE — Progress Notes (Signed)
Patient ID: [name], female   DOB: July 10, 1966, 52 y.o.   MRN: [id number] [facility] Progress Note   Assessment/ Plan:   1.  Acute kidney injury on chronic kidney disease stage III T: This appears to be from volume depletion/ATN associated with possible sepsis-urine output poorly charted however labs indicate stable renal function at her previously established allograft baseline function.  She is euvolemic on physical exam and does not have any acute electrolyte abnormality/uremic symptoms to prompt intervention. 2.  Status post deceased donor kidney transplant: Continue ongoing chronic immunosuppressive therapy with Prograf 1 mg daily and prednisone 5 mg daily-Prograf level will be rechecked again tomorrow morning after elevated trough level noted on 11/17. 3.  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  Plans noted for AKA by Dr. Mia Creekuda-unable to get this done yesterday because of inability to reach husband for consent. 4.  Encephalopathy-likely metabolic: Suspect that this is largely in part from underlying osteomyelitis/sepsis should improve with source control. 5.  Thrombocytopenia: Suspected to be associated with chronic inflammation/medications-no evidence of TTP/DIC.  Appreciate input from hematology.  Platelet transfusion as needed to optimize for surgery. 6.  Anemia: Without overt blood loss, suspect anemia from underlying chronic kidney disease as well as critical illness/malnutrition-inflammatory complex with ongoing osteomyelitis.  She will likely need PRBC transfusion postop.  Subjective:   Without acute events noted overnight.  Unable to get right AKA after inability to reach husband for consent.   Objective:   BP 136/78   Pulse (!) 103   Temp (!) 97.5 F (36.4 C) (Oral)   Resp 19   Ht 5\' 8"  (1.727 m)   Wt 76.2 kg   LMP 11/19/2011 Comment: pt had tubal ligation  SpO2 100%   BMI 25.54 kg/m    Intake/Output Summary (Last 24 hours) at 07/28/2018 1019 Last data filed at 07/28/2018 0446 Gross per 24 hour  Intake -  Output 500 ml  Net -500 ml   Weight change:   Physical Exam: Gen: Appears to be comfortable resting in bed-confused, husband at bedside CVS: Pulse regular rhythm, normal rate, S1 and S2 normal Resp: Clear to auscultation bilaterally, no distinct rales or rhonchi Abd: Soft, obese, nontender Ext: Right BKA stump in clean/dry dressing.  No left lower extremity edema.  Imaging: No results found.  Labs: BMET Recent Labs  Lab 07/08/2018 1801 07/23/18 0435 07/24/18 0429 07/25/18 0448 07/26/18 0424 2017-10-22 0620 07/28/18 0457  NA 146* 145 147* 142 140 139 136  K 4.4 4.1 3.8 3.0* 4.2 3.5 4.1  CL 122* 119* 112* 108 109 108 107  CO2 18* 19* 25 29 23 23  21*  GLUCOSE 60* 140* 286* 89 161* 76 145*  BUN 38* 35* 30* 26* 23* 22* 21*  CREATININE 3.31* 3.23* 3.23* 2.84* 2.72* 2.47* 2.50*  CALCIUM 8.9 8.5* 7.9* 7.6* 8.0* 8.1* 7.7*  PHOS 3.1  --   --  2.9 2.9 2.7 2.6   CBC Recent Labs  Lab 07/25/18 0448 07/26/18 0424 2017-10-22 0620 07/28/18 0457  WBC 5.8 5.6 6.9 6.3  NEUTROABS 4.8 4.9 5.2 4.7  HGB 8.2* 9.9* 10.9* 9.4*  HCT 27.6* 32.9* 35.5* 30.6*  MCV 90.8 89.6 88.3 87.4  PLT 45* 56* 77* 70*    Medications:    . Chlorhexidine Gluconate Cloth  6 each Topical Daily  . Gerhardt's butt cream   Topical BID  . insulin aspart  0-15 Units Subcutaneous TID WC  . insulin  aspart  0-5 Units Subcutaneous QHS  . levothyroxine  66 mcg Intravenous Daily  . nystatin   Topical BID  . predniSONE  5 mg Oral Q breakfast  . tacrolimus  1 mg Oral QPC breakfast   Zetta Bills, MD 07/28/2018, 10:19 AM

## 2018-07-28 NOTE — Progress Notes (Signed)
Left message on husband's phone to obtain consent for blood products

## 2018-07-28 NOTE — Telephone Encounter (Signed)
Attempted to call patient's husband Casimiro NeedleMichael several times today.  Left 2 messages.  We needed to discuss Ms. Budden's surgery that is scheduled for tomorrow.  Need consent for this procedure.  He has not returned my calls.

## 2018-07-29 ENCOUNTER — Inpatient Hospital Stay (HOSPITAL_COMMUNITY): Payer: Medicare Other | Admitting: Certified Registered"

## 2018-07-29 ENCOUNTER — Encounter (HOSPITAL_COMMUNITY): Payer: Self-pay

## 2018-07-29 ENCOUNTER — Other Ambulatory Visit: Payer: Self-pay

## 2018-07-29 ENCOUNTER — Encounter (HOSPITAL_COMMUNITY): Admission: AD | Disposition: E | Payer: Self-pay | Source: Other Acute Inpatient Hospital | Attending: Family Medicine

## 2018-07-29 HISTORY — PX: AMPUTATION: SHX166

## 2018-07-29 LAB — CBC WITH DIFFERENTIAL/PLATELET
Abs Immature Granulocytes: 0.01 10*3/uL (ref 0.00–0.07)
BASOS ABS: 0 10*3/uL (ref 0.0–0.1)
Basophils Relative: 0 %
EOS ABS: 0.1 10*3/uL (ref 0.0–0.5)
Eosinophils Relative: 1 %
HEMATOCRIT: 25.2 % — AB (ref 36.0–46.0)
HEMOGLOBIN: 7.7 g/dL — AB (ref 12.0–15.0)
Immature Granulocytes: 0 %
LYMPHS ABS: 1 10*3/uL (ref 0.7–4.0)
Lymphocytes Relative: 18 %
MCH: 27.6 pg (ref 26.0–34.0)
MCHC: 30.6 g/dL (ref 30.0–36.0)
MCV: 90.3 fL (ref 80.0–100.0)
MONOS PCT: 9 %
Monocytes Absolute: 0.5 10*3/uL (ref 0.1–1.0)
Neutro Abs: 4 10*3/uL (ref 1.7–7.7)
Neutrophils Relative %: 72 %
Platelets: 148 10*3/uL — ABNORMAL LOW (ref 150–400)
RBC: 2.79 MIL/uL — ABNORMAL LOW (ref 3.87–5.11)
RDW: 18.6 % — ABNORMAL HIGH (ref 11.5–15.5)
WBC: 5.5 10*3/uL (ref 4.0–10.5)
nRBC: 0 % (ref 0.0–0.2)

## 2018-07-29 LAB — GLUCOSE, CAPILLARY
GLUCOSE-CAPILLARY: 156 mg/dL — AB (ref 70–99)
Glucose-Capillary: 125 mg/dL — ABNORMAL HIGH (ref 70–99)
Glucose-Capillary: 73 mg/dL (ref 70–99)
Glucose-Capillary: 65 mg/dL — ABNORMAL LOW (ref 70–99)
Glucose-Capillary: 91 mg/dL (ref 70–99)
Glucose-Capillary: 123 mg/dL — ABNORMAL HIGH (ref 70–99)

## 2018-07-29 LAB — RENAL FUNCTION PANEL
ALBUMIN: 1.6 g/dL — AB (ref 3.5–5.0)
ANION GAP: 8 (ref 5–15)
BUN: 23 mg/dL — AB (ref 6–20)
CHLORIDE: 110 mmol/L (ref 98–111)
CO2: 22 mmol/L (ref 22–32)
Calcium: 8.1 mg/dL — ABNORMAL LOW (ref 8.9–10.3)
Creatinine, Ser: 2.75 mg/dL — ABNORMAL HIGH (ref 0.44–1.00)
GFR calc Af Amer: 22 mL/min — ABNORMAL LOW (ref >60)
GFR calc non Af Amer: 19 mL/min — ABNORMAL LOW (ref >60)
GLUCOSE: 107 mg/dL — AB (ref 70–99)
PHOSPHORUS: 3 mg/dL (ref 2.5–4.6)
POTASSIUM: 3.6 mmol/L (ref 3.5–5.1)
Sodium: 140 mmol/L (ref 135–145)

## 2018-07-29 SURGERY — AMPUTATION, ABOVE KNEE
Anesthesia: General | Laterality: Right

## 2018-07-29 MED ORDER — METHOCARBAMOL 500 MG PO TABS
500.0000 mg | ORAL_TABLET | Freq: Four times a day (QID) | ORAL | Status: DC | PRN
  Administered 2018-07-29 – 2018-07-30 (×4): 500 mg via ORAL
  Filled 2018-07-29 (×4): qty 1

## 2018-07-29 MED ORDER — PROPOFOL 10 MG/ML IV BOLUS
INTRAVENOUS | Status: DC | PRN
  Administered 2018-07-29: 100 mg via INTRAVENOUS

## 2018-07-29 MED ORDER — DOCUSATE SODIUM 100 MG PO CAPS
100.0000 mg | ORAL_CAPSULE | Freq: Two times a day (BID) | ORAL | Status: DC
  Administered 2018-07-29 – 2018-07-30 (×3): 100 mg via ORAL
  Filled 2018-07-29 (×3): qty 1

## 2018-07-29 MED ORDER — GLYCOPYRROLATE PF 0.2 MG/ML IJ SOSY
PREFILLED_SYRINGE | INTRAMUSCULAR | Status: AC
  Filled 2018-07-29: qty 1

## 2018-07-29 MED ORDER — FENTANYL CITRATE (PF) 250 MCG/5ML IJ SOLN
INTRAMUSCULAR | Status: DC | PRN
  Administered 2018-07-29: 50 ug via INTRAVENOUS
  Administered 2018-07-29: 25 ug via INTRAVENOUS
  Administered 2018-07-29: 50 ug via INTRAVENOUS
  Administered 2018-07-29: 25 ug via INTRAVENOUS

## 2018-07-29 MED ORDER — LACTATED RINGERS IV SOLN: INTRAVENOUS | Status: DC | PRN

## 2018-07-29 MED ORDER — MIDAZOLAM HCL 2 MG/2ML IJ SOLN
INTRAMUSCULAR | Status: AC
  Filled 2018-07-29: qty 2

## 2018-07-29 MED ORDER — HYDROMORPHONE HCL 1 MG/ML IJ SOLN: 0.2500 mg | INTRAMUSCULAR | Status: DC | PRN

## 2018-07-29 MED ORDER — SODIUM CHLORIDE 0.9 % IV SOLN
INTRAVENOUS | Status: DC | PRN
  Administered 2018-07-29: 09:00:00 via INTRAVENOUS

## 2018-07-29 MED ORDER — ETOMIDATE 2 MG/ML IV SOLN
INTRAVENOUS | Status: AC
  Filled 2018-07-29: qty 10

## 2018-07-29 MED ORDER — FENTANYL CITRATE (PF) 100 MCG/2ML IJ SOLN: 25.0000 ug | INTRAMUSCULAR | Status: DC | PRN

## 2018-07-29 MED ORDER — METHOCARBAMOL 1000 MG/10ML IJ SOLN
500.0000 mg | Freq: Four times a day (QID) | INTRAVENOUS | Status: DC | PRN
  Administered 2018-07-31: 500 mg via INTRAVENOUS
  Filled 2018-07-29 (×3): qty 5

## 2018-07-29 MED ORDER — 0.9 % SODIUM CHLORIDE (POUR BTL) OPTIME
TOPICAL | Status: DC | PRN
  Administered 2018-07-29: 1000 mL

## 2018-07-29 MED ORDER — METOCLOPRAMIDE HCL 5 MG/ML IJ SOLN
5.0000 mg | Freq: Three times a day (TID) | INTRAMUSCULAR | Status: DC | PRN
  Administered 2018-07-29: 10 mg via INTRAVENOUS
  Filled 2018-07-29: qty 2

## 2018-07-29 MED ORDER — SODIUM CHLORIDE 0.9 % IV SOLN
INTRAVENOUS | Status: DC
  Administered 2018-07-29: 12:00:00 via INTRAVENOUS
  Administered 2018-08-01: 1000 mL via INTRAVENOUS

## 2018-07-29 MED ORDER — FENTANYL CITRATE (PF) 250 MCG/5ML IJ SOLN
INTRAMUSCULAR | Status: AC
  Filled 2018-07-29: qty 5

## 2018-07-29 MED ORDER — MAGNESIUM CITRATE PO SOLN: 1.0000 | Freq: Once | ORAL | Status: DC | PRN

## 2018-07-29 MED ORDER — TACROLIMUS 1 MG PO CAPS
1.0000 mg | ORAL_CAPSULE | Freq: Every day | ORAL | Status: DC
  Administered 2018-07-29 – 2018-07-30 (×2): 1 mg via ORAL
  Filled 2018-07-29 (×5): qty 1

## 2018-07-29 MED ORDER — ONDANSETRON HCL 4 MG PO TABS: 4.0000 mg | ORAL_TABLET | Freq: Four times a day (QID) | ORAL | Status: DC | PRN

## 2018-07-29 MED ORDER — BISACODYL 10 MG RE SUPP: 10.0000 mg | Freq: Every day | RECTAL | Status: DC | PRN

## 2018-07-29 MED ORDER — METOCLOPRAMIDE HCL 10 MG PO TABS: 5.0000 mg | ORAL_TABLET | Freq: Three times a day (TID) | ORAL | Status: DC | PRN

## 2018-07-29 MED ORDER — FENTANYL CITRATE (PF) 100 MCG/2ML IJ SOLN
INTRAMUSCULAR | Status: AC
  Filled 2018-07-29: qty 2

## 2018-07-29 MED ORDER — SODIUM CHLORIDE 0.9 % IV SOLN: INTRAVENOUS | Status: DC | PRN

## 2018-07-29 MED ORDER — ONDANSETRON HCL 4 MG/2ML IJ SOLN: 4.0000 mg | Freq: Four times a day (QID) | INTRAMUSCULAR | Status: DC | PRN

## 2018-07-29 MED ORDER — POLYETHYLENE GLYCOL 3350 17 G PO PACK: 17.0000 g | PACK | Freq: Every day | ORAL | Status: DC | PRN

## 2018-07-29 MED ORDER — LIDOCAINE 2% (20 MG/ML) 5 ML SYRINGE
INTRAMUSCULAR | Status: DC | PRN
  Administered 2018-07-29: 60 mg via INTRAVENOUS

## 2018-07-29 SURGICAL SUPPLY — 34 items
BLADE SAW RECIP 87.9 MT (BLADE) ×2 IMPLANT
BNDG COHESIVE 6X5 TAN STRL LF (GAUZE/BANDAGES/DRESSINGS) ×2 IMPLANT
CANISTER WOUND CARE 500ML ATS (WOUND CARE) IMPLANT
CANISTER WOUNDNEG PRESSURE 500 (CANNISTER) ×1 IMPLANT
COVER SURGICAL LIGHT HANDLE (MISCELLANEOUS) ×2 IMPLANT
COVER WAND RF STERILE (DRAPES) ×1 IMPLANT
CUFF TOURNIQUET SINGLE 34IN LL (TOURNIQUET CUFF) IMPLANT
DRAPE INCISE IOBAN 66X45 STRL (DRAPES) ×4 IMPLANT
DRAPE U-SHAPE 47X51 STRL (DRAPES) ×2 IMPLANT
DRESSING PREVENA PLUS CUSTOM (GAUZE/BANDAGES/DRESSINGS) ×1 IMPLANT
DRSG PREVENA PLUS CUSTOM (GAUZE/BANDAGES/DRESSINGS) ×2
DURAPREP 26ML APPLICATOR (WOUND CARE) ×2 IMPLANT
ELECT REM PT RETURN 9FT ADLT (ELECTROSURGICAL) ×2
ELECTRODE REM PT RTRN 9FT ADLT (ELECTROSURGICAL) ×1 IMPLANT
GLOVE BIOGEL PI IND STRL 9 (GLOVE) ×1 IMPLANT
GLOVE BIOGEL PI INDICATOR 9 (GLOVE) ×1
GLOVE SURG ORTHO 9.0 STRL STRW (GLOVE) ×2 IMPLANT
GOWN STRL REUS W/ TWL XL LVL3 (GOWN DISPOSABLE) ×2 IMPLANT
GOWN STRL REUS W/TWL XL LVL3 (GOWN DISPOSABLE) ×2
KIT BASIN OR (CUSTOM PROCEDURE TRAY) ×2 IMPLANT
KIT TURNOVER KIT B (KITS) ×2 IMPLANT
MANIFOLD NEPTUNE II (INSTRUMENTS) ×2 IMPLANT
NS IRRIG 1000ML POUR BTL (IV SOLUTION) ×2 IMPLANT
PACK ORTHO EXTREMITY (CUSTOM PROCEDURE TRAY) ×2 IMPLANT
PAD ARMBOARD 7.5X6 YLW CONV (MISCELLANEOUS) ×2 IMPLANT
PREVENA RESTOR ARTHOFORM 33X30 (CANNISTER) ×1 IMPLANT
STAPLER VISISTAT 35W (STAPLE) IMPLANT
STOCKINETTE IMPERVIOUS LG (DRAPES) IMPLANT
SUT ETHILON 2 0 PSLX (SUTURE) ×4 IMPLANT
SUT SILK 2 0 (SUTURE) ×1
SUT SILK 2-0 18XBRD TIE 12 (SUTURE) ×1 IMPLANT
TOWEL GREEN STERILE FF (TOWEL DISPOSABLE) ×2 IMPLANT
TUBE CONNECTING 20X1/4 (TUBING) ×2 IMPLANT
YANKAUER SUCT BULB TIP NO VENT (SUCTIONS) ×2 IMPLANT

## 2018-07-29 NOTE — Anesthesia Procedure Notes (Addendum)
Procedure Name: LMA Insertion Date/Time: 07/15/2018 9:37 AM Performed by: Ponciano OrtBrewer, Analeah Brame, CRNA Pre-anesthesia Checklist: Patient identified, Emergency Drugs available, Suction available and Patient being monitored Patient Re-evaluated:Patient Re-evaluated prior to induction Oxygen Delivery Method: Circle system utilized Preoxygenation: Pre-oxygenation with 100% oxygen Induction Type: IV induction LMA: LMA inserted LMA Size: 4.0 Number of attempts: 1 Placement Confirmation: positive ETCO2 Tube secured with: Tape (lips looked dry in preop; a little chip on the upper middle lip; ointment applied. ) Dental Injury: Injury to lip

## 2018-07-29 NOTE — Progress Notes (Signed)
Patient ID: [name], female   DOB: 1966/01/24, 52 y.o.   MRN: [id number] [facility] Progress Note   Assessment/ Plan:   1.  Acute kidney injury on chronic kidney disease stage III T: Likely from volume depletion/ATN associated with possible sepsis.  Labs show improving renal function with slight worsening of creatinine this morning possibly because of her n.p.o. status. She is euvolemic on physical exam and does not have any acute electrolyte abnormality/uremic symptoms to prompt intervention. 2.  Status post deceased donor kidney transplant: Continue ongoing chronic immunosuppressive therapy with Prograf 1 mg daily and prednisone 5 mg daily-Prograf level will be rechecked again tomorrow morning after elevated trough level noted on 11/17.  Prograf not given this morning. 3.  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  She underwent AKA earlier this morning by Dr. Lajoyce Cornersuda and has a wound VAC in situ. 4.  Encephalopathy-likely metabolic: Suspect that this is largely in part from underlying osteomyelitis/sepsis-anticipated to improve with source control. 5.  Thrombocytopenia: Suspected to be associated with chronic inflammation/medications-no evidence of TTP/DIC.  Appreciate input from hematology.  6.  Anemia: Without overt blood loss, suspect anemia from underlying chronic kidney disease as well as critical illness/malnutrition-inflammatory complex with ongoing osteomyelitis.  She will likely need PRBC transfusion postop.  Subjective:   Status post above-knee amputation earlier today-complains of pain over surgical site.   Objective:   BP (!) 143/79   Pulse 90   Temp (!) 97.4 F (36.3 C) (Oral)   Resp 15   Ht 5\' 8"  (1.727 m)   Wt 67.4 kg   LMP 11/19/2011 Comment: pt had tubal ligation  SpO2 99%   BMI 22.59 kg/m   Intake/Output Summary (Last 24 hours) at 07/12/2018 1305 Last data filed at 08/01/2018  1051 Gross per 24 hour  Intake 1295 ml  Output 1250 ml  Net 45 ml   Weight change:   Physical Exam: Gen: Appears to be uncomfortable resting in bed  CVS: Pulse regular rhythm, normal rate, S1 and S2 normal Resp: Clear to auscultation bilaterally, no distinct rales or rhonchi Abd: Soft, obese, nontender Ext: Right AKA stump with wound VAC in situ.  No left lower extremity edema.  Imaging: [place] Ekg Site Rite  Result Date: 07/28/2018 If Site Rite image not attached, placement could not be confirmed due to current cardiac rhythm.   Labs: BMET Recent Labs  Lab 09-01-2018 1801 07/23/18 0435 07/24/18 0429 07/25/18 0448 07/26/18 0424 07/28/2018 0620 07/28/18 0457 08/05/2018 1133  NA 146* 145 147* 142 140 139 136 140  K 4.4 4.1 3.8 3.0* 4.2 3.5 4.1 3.6  CL 122* 119* 112* 108 109 108 107 110  CO2 18* 19* 25 29 23 23  21* 22  GLUCOSE 60* 140* 286* 89 161* 76 145* 107*  BUN 38* 35* 30* 26* 23* 22* 21* 23*  CREATININE 3.31* 3.23* 3.23* 2.84* 2.72* 2.47* 2.50* 2.75*  CALCIUM 8.9 8.5* 7.9* 7.6* 8.0* 8.1* 7.7* 8.1*  PHOS 3.1  --   --  2.9 2.9 2.7 2.6 3.0   CBC Recent Labs  Lab 07/25/18 0448 07/26/18 0424 07/31/2018 0620 07/28/18 0457  WBC 5.8 5.6 6.9 6.3  NEUTROABS 4.8 4.9 5.2 4.7  HGB 8.2* 9.9* 10.9* 9.4*  HCT 27.6* 32.9* 35.5* 30.6*  MCV 90.8 89.6 88.3 87.4  PLT 45* 56* 77* 70*    Medications:    . Chlorhexidine Gluconate Cloth  6 each Topical Daily  .  docusate sodium  100 mg Oral BID  . Gerhardt's butt cream   Topical BID  . insulin aspart  0-15 Units Subcutaneous TID WC  . insulin aspart  0-5 Units Subcutaneous QHS  . levothyroxine  112 mcg Oral QAC breakfast  . nystatin   Topical BID  . predniSONE  5 mg Oral Q breakfast  . tacrolimus  1 mg Oral QPC breakfast   Zetta Bills, MD 07/18/2018, 1:05 PM

## 2018-07-29 NOTE — Interval H&P Note (Signed)
History and Physical Interval Note:  07/09/2018 7:21 AM  [name] ParodyPamela S Cassidy  has presented today for surgery, with the diagnosis of dehiscence right above knee amputation  The various methods of treatment have been discussed with the patient and family. After consideration of risks, benefits and other options for treatment, the patient has consented to  Procedure(s): AMPUTATION ABOVE KNEE (Right) as a surgical intervention .  The patient's history has been reviewed, patient examined, no change in status, stable for surgery.  I have reviewed the patient's chart and labs.  Questions were answered to the patient's satisfaction.     Nadara MustardMarcus V Syrina Wake

## 2018-07-29 NOTE — Anesthesia Preprocedure Evaluation (Addendum)
Anesthesia Evaluation  Patient identified by MRN, date of birth, ID band Patient awake    Reviewed: Allergy & Precautions, NPO status , Patient's Chart, lab work & pertinent test results  Airway Mallampati: II  TM Distance: >3 FB Neck ROM: Full    Dental no notable dental hx. (+) Edentulous Upper   Pulmonary neg pulmonary ROS, former smoker,    Pulmonary exam normal breath sounds clear to auscultation       Cardiovascular hypertension, negative cardio ROS Normal cardiovascular exam Rhythm:Regular Rate:Normal  TTE 11/2017 EF 60-65%, no valvular abnormalities   Neuro/Psych  Headaches, PSYCHIATRIC DISORDERS Depression    GI/Hepatic negative GI ROS, Neg liver ROS,   Endo/Other  diabetes, Type 1, Insulin DependentHypothyroidism   Renal/GU ESRFRenal diseaseS/p renal transplant  negative genitourinary   Musculoskeletal  (+) Arthritis ,   Abdominal   Peds  Hematology  (+) Blood dyscrasia, anemia ,   Anesthesia Other Findings   Reproductive/Obstetrics                           Anesthesia Physical Anesthesia Plan  ASA: III  Anesthesia Plan: General   Post-op Pain Management:    Induction: Intravenous  PONV Risk Score and Plan: 3 and Ondansetron and Treatment may vary due to age or medical condition  Airway Management Planned: LMA  Additional Equipment:   Intra-op Plan:   Post-operative Plan: Extubation in OR  Informed Consent: I have reviewed the patients History and Physical, chart, labs and discussed the procedure including the risks, benefits and alternatives for the proposed anesthesia with the patient or authorized representative who has indicated his/her understanding and acceptance.   Dental advisory given  Plan Discussed with: CRNA  Anesthesia Plan Comments:         Anesthesia Quick Evaluation

## 2018-07-29 NOTE — Progress Notes (Signed)
Patient Demographics:    [name], is a 52 y.o. female, DOB - 15-Oct-1965, ZOX:[id number]  Admit date - 08-05-18   Admitting Physician Kendell Bane, MD  Outpatient Primary MD for the patient is Dois Davenport, MD  LOS - 7   No chief complaint on file.       Subjective:    [name] today has no fevers, no emesis,  No chest pain, pleasantly confused, for right AKA  Assessment  & Plan :    Active Problems:   Severe protein-calorie malnutrition (HCC)   Osteomyelitis (HCC)   Pressure injury of skin   Diabetic polyneuropathy associated with type 1 diabetes mellitus (HCC)   Dehiscence of amputation stump (HCC)  Brief Summary:- [name] is 52 yo female with h/o severe PAD, renal transplant on tac/ pred, CKD III,  H/o culture negative osteomyelitis of her previous right BKA 05/11/18 admitted from [facility] on 08/05/18 with toxic metabolic encephalopathy and right BKA wound dehiscence/infection and osteomyelitis , on daptomycin and ceftriaxone ,       Plan:- 1)Sepsis secondary to Rt BKA wound Dehiscence and Osteomyelitis----infectious disease consult appreciated, continue IV Rocephin and daptomycin, plan is for right AKA, no fever or leukocytosis at this time.    right AKA  rescheduled for 07/30/2018  2)Toxic metabolic encephalopathy--- multifactorial, suspect secondary to AKI on CKD, as well as infection/osteomyelitis of the right BKA stump with sepsis  3)Acute on chronic Anemia--- at baseline patient has chronic anemia of CKD, EPO agent as per nephrologist, monitor H&H closely and transfuse as clinically indicated, hemoglobin is down to 7.7, repeat in a.m. may need transfusion  4)S/p Deceased Donor Kidney TRansplant --- patient now with AKI superimposed on CKD 3 --- suspect due to dehydration/volume depletion continue tacrolimus and prednisone, nephrology input appreciated,   creatinine on admission= 3.3 ,   baseline creatinine = 2.4 to 2.6    , creatinine is now= 2.75    , renally adjust medications, avoid nephrotoxic agents/dehydration/hypotension   5)Thrombocytopenia--- status post transfusion of Nplate on 07/26/2018 and transfusion of 1 unit of platelets on 07/26/18, goal is to keep platelets above 100 k preop,pt got transfuse 2 units of platelets overnight on 07/28/18 , Rt AKA surgery is planned for 07/18/2018, platelet count is up to 148K  Disposition/Need for in-Hospital Stay- patient unable to be discharged at this time due to needing IV antibiotics and revision of a right BKA to AKA due to sepsis/wound dehiscence/infection/osteomyelitis  Code Status : Full  Disposition Plan  : TBD  Consults  :  Ortho/ID/Nephrology   DVT Prophylaxis  :  ( SCDs on Lt), thrombocytopenia so no heparin  Lab Results  Component Value Date   PLT 148 (L) 08/02/2018    Inpatient Medications  Scheduled Meds: . Chlorhexidine Gluconate Cloth  6 each Topical Daily  . docusate sodium  100 mg Oral BID  . Gerhardt's butt cream   Topical BID  . insulin aspart  0-15 Units Subcutaneous TID WC  . insulin aspart  0-5 Units Subcutaneous QHS  . levothyroxine  112 mcg Oral QAC breakfast  . nystatin   Topical BID  . predniSONE  5 mg Oral Q breakfast  . tacrolimus  1 mg Oral QPC breakfast  Continuous Infusions: . sodium chloride 10 mL/hr at 07/18/2018 1156  . cefTRIAXone (ROCEPHIN)  IV 2 g (07/28/2018 1801)  . DAPTOmycin (CUBICIN)  IV Stopped (07/08/2018 0140)  . methocarbamol (ROBAXIN) IV     PRN Meds:.acetaminophen **OR** acetaminophen, bisacodyl, haloperidol lactate, hydrALAZINE, labetalol, magnesium citrate, methocarbamol **OR** methocarbamol (ROBAXIN) IV, metoCLOPramide **OR** metoCLOPramide (REGLAN) injection, ondansetron **OR** ondansetron (ZOFRAN) IV, polyethylene glycol, senna-docusate    Anti-infectives (From admission, onward)   Start     Dose/Rate Route Frequency Ordered  Stop   07/11/2018 0745  ceFAZolin (ANCEF) IVPB 2g/100 mL premix  Status:  Discontinued     2 g 200 mL/hr over 30 Minutes Intravenous On call to O.R. 07/12/2018 0731 08/05/2018 1622   07/26/18 2000  DAPTOmycin (CUBICIN) 600 mg in sodium chloride 0.9 % IVPB     600 mg 224 mL/hr over 30 Minutes Intravenous Every 48 hours 07/25/18 1145     Jan 31, 2018 2000  DAPTOmycin (CUBICIN) 300 mg in sodium chloride 0.9 % IVPB  Status:  Discontinued     300 mg 212 mL/hr over 30 Minutes Intravenous Every 48 hours Jan 31, 2018 1543 07/25/18 1145   Jan 31, 2018 1800  cefTRIAXone (ROCEPHIN) 2 g in sodium chloride 0.9 % 100 mL IVPB     2 g 200 mL/hr over 30 Minutes Intravenous Every 24 hours Jan 31, 2018 1543          Objective:   Vitals:   07/21/2018 1302 07/28/2018 1402 07/12/2018 1502 07/13/2018 1602  BP: (!) 148/84 (!) 148/81 (!) 152/81 (!) 150/79  Pulse: 93 92 91 91  Resp: 14 (!) 9 12 15   Temp:  (!) 97.4 F (36.3 C) (!) 97.4 F (36.3 C)   TempSrc:  Oral Oral   SpO2: 98% 99% 95% 98%  Weight:      Height:        Wt Readings from Last 3 Encounters:  07/21/2018 67.4 kg  01/11/18 76.4 kg  01/01/18 83.9 kg     Intake/Output Summary (Last 24 hours) at 07/14/2018 1836 Last data filed at 08/02/2018 1500 Gross per 24 hour  Intake 1325.6 ml  Output 800 ml  Net 525.6 ml     Physical Exam Patient is examined daily including today on 07/19/2018 , exams remain the same as of yesterday except that has changed   Gen:- Awake Alert, remains pleasantly confused, HEENT:- [place].AT, No sclera icterus Neck-Supple Neck,No JVD,.  Lungs-  CTAB , fair symmetrical air movement CV- S1, S2 normal, regular  Abd-  +ve B.Sounds, Abd Soft, No tenderness,    Extremity/Skin:-Right BKA stump with dressing over it, left foot with missing big toe Psych-affect is flat, pleasantly confused, oriented to self Neuro-no new focal deficits, no tremors   Data Review:   Micro Results Recent Results (from the past 240 hour(s))  MRSA PCR Screening      Status: None   Collection Time: Jan 31, 2018  2:28 PM  Result Value Ref Range Status   MRSA by PCR NEGATIVE NEGATIVE Final    Comment:        The GeneXpert MRSA Assay (FDA approved for NASAL specimens only), is one component of a comprehensive MRSA colonization surveillance program. It is not intended to diagnose MRSA infection nor to guide or monitor treatment for MRSA infections. Performed at [facility], [address] [place], [place] [address]   Surgical pcr screen     Status: None   Collection Time: 07/31/2018  6:19 AM  Result Value Ref Range Status   MRSA, PCR  NEGATIVE NEGATIVE Final   Staphylococcus aureus NEGATIVE NEGATIVE Final    Comment: (NOTE) The Xpert SA Assay (FDA approved for NASAL specimens in patients 91 years of age and older), is one component of a comprehensive surveillance program. It is not intended to diagnose infection nor to guide or monitor treatment. Performed at [facility], [address] [place], [place] [address]     Radiology Reports Ct Head Wo Contrast  Result Date: 07/25/2018 CLINICAL DATA:  52 year old female with altered level of consciousness. EXAM: CT HEAD WITHOUT CONTRAST TECHNIQUE: Contiguous axial images were obtained from the base of the skull through the vertex without intravenous contrast. COMPARISON:  07/19/2018 FINDINGS: Motif shin artifact decreases sensitivity despite rescanning. Brain: No evidence of acute infarction, hemorrhage, hydrocephalus, extra-axial collection or mass lesion/mass effect. Mild atrophy and chronic small-vessel white matter ischemic changes again noted. Vascular: Vertebral and carotid atherosclerotic calcifications noted. Skull: No acute abnormality. Sinuses/Orbits: No acute abnormality. Mucoperiosteal thickening of the paranasal sinuses again noted. Other: None IMPRESSION: 1. No evidence of acute intracranial abnormality 2. Mild atrophy and chronic small-vessel white matter ischemic changes 3.  Chronic paranasal sinus disease. Electronically Signed   By: Harmon Pier M.D.   On: 07/25/2018 12:01   [place] Ekg Site Rite  Result Date: 07/28/2018 If Site Rite image not attached, placement could not be confirmed due to current cardiac rhythm.    CBC Recent Labs  Lab 07/25/18 0448 07/26/18 0424 07/31/2018 0620 07/28/18 0457 07/10/2018 1133  WBC 5.8 5.6 6.9 6.3 5.5  HGB 8.2* 9.9* 10.9* 9.4* 7.7*  HCT 27.6* 32.9* 35.5* 30.6* 25.2*  PLT 45* 56* 77* 70* 148*  MCV 90.8 89.6 88.3 87.4 90.3  MCH 27.0 27.0 27.1 26.9 27.6  MCHC 29.7* 30.1 30.7 30.7 30.6  RDW 19.5* 18.7* 18.3* 18.2* 18.6*  LYMPHSABS 0.7 0.5* 1.1 1.1 1.0  MONOABS 0.3 0.2 0.4 0.4 0.5  EOSABS 0.0 0.0 0.2 0.1 0.1  BASOSABS 0.0 0.0 0.0 0.0 0.0    Chemistries  Recent Labs  Lab 07/23/18 1059  07/25/18 0448 07/26/18 0424 07/19/2018 0620 07/28/18 0457 07/12/2018 1133  NA  --    < > 142 140 139 136 140  K  --    < > 3.0* 4.2 3.5 4.1 3.6  CL  --    < > 108 109 108 107 110  CO2  --    < > 29 23 23  21* 22  GLUCOSE  --    < > 89 161* 76 145* 107*  BUN  --    < > 26* 23* 22* 21* 23*  CREATININE  --    < > 2.84* 2.72* 2.47* 2.50* 2.75*  CALCIUM  --    < > 7.6* 8.0* 8.1* 7.7* 8.1*  MG  --   --  1.7  --   --   --   --   AST 16  --   --   --   --   --   --   ALT 16  --   --   --   --   --   --   ALKPHOS 124  --   --   --   --   --   --   BILITOT 0.7  --   --   --   --   --   --    < > = values in this interval not displayed.   ------------------------------------------------------------------------------------------------------------------ No results for input(s): CHOL, HDL, LDLCALC,  TRIG, CHOLHDL, LDLDIRECT in the last 72 hours.  Lab Results  Component Value Date   HGBA1C 6.2 (H) 07/26/2018   ------------------------------------------------------------------------------------------------------------------ No results for input(s): TSH, T4TOTAL, T3FREE, THYROIDAB in the last 72 hours.  Invalid input(s):  FREET3 ------------------------------------------------------------------------------------------------------------------ No results for input(s): VITAMINB12, FOLATE, FERRITIN, TIBC, IRON, RETICCTPCT in the last 72 hours.  Coagulation profile Recent Labs  Lab 07/23/18 1059 07/25/18 0448  INR 1.41 1.28    No results for input(s): DDIMER in the last 72 hours.  Cardiac Enzymes No results for input(s): CKMB, TROPONINI, MYOGLOBIN in the last 168 hours.  Invalid input(s): CK ------------------------------------------------------------------------------------------------------------------    Component Value Date/Time   BNP 958.8 (H) 11/14/2017 1313    Shon Hale M.D on 2018/08/10 at 6:36 PM  Pager---[phone] Go to [email or web address] - password [email or web address] for contact info  [facility] - Office  [phone]

## 2018-07-29 NOTE — Progress Notes (Signed)
Physical Therapy Treatment Patient Details Name: [name] MRN: [id number] DOB: 28-Feb-1966 Today's Date: 07/25/2018    History of Present Illness 52 y.o. 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  Patient was in bed with no visitors present upon PT arrival.  Patient continues to be very confused and disoriented.  Patient also was agitated throughout session.  Patient was able to participate in reaching across midline activities with L UE today requiring repeated VC to visualize target and stay on task due to increased confusion.  Patient would continue to benefit from acute care PT in order to progress towards all stated goals and improve functional mobility.  Patient continues to be a good candidate for SNF placement based on current functional status.   Follow Up Recommendations  SNF;Supervision/Assistance - 24 hour     Equipment Recommendations  Other (comment)(TBA)    Recommendations for Other Services       Precautions / Restrictions Precautions Precautions: Fall Precaution Comments: wound and skin issues Restrictions Weight Bearing Restrictions: No    Mobility  Bed Mobility Overal bed mobility: Needs Assistance Bed Mobility: Rolling;Sidelying to Sit;Sit to Supine Rolling: Max assist;+2 for physical assistance Sidelying to sit: Max assist;+2 for physical assistance   Sit to supine: Max assist;+2 for physical assistance   General bed mobility comments: Pt required repeated verbal cueing to reach for bed railing and to roll several times during treatment.  Patient was able to minimally grip bed railing for assistance.  Patient required max A to move L LE over EOB.   Patient required max A +2 for elevation and support of trunk into sitting.  Patient required max A to raise L LE against gravity from sitting to supine.  Transfers                    Ambulation/Gait                 Stairs             Wheelchair Mobility    Modified Rankin (Stroke Patients Only)       Balance Overall balance assessment: Needs assistance Sitting-balance support: Bilateral upper extremity supported;Feet supported Sitting balance-Leahy Scale: Poor Sitting balance - Comments: Pt was able to sit EOB for approximately 10 mins requiring B UE support from end of bed railing and bed surface.  pt also required external assist from therapist to maintain balance.                                    Cognition Arousal/Alertness: Awake/alert Behavior During Therapy: Agitated(confused) Overall Cognitive Status: Impaired/Different from baseline Area of Impairment: Orientation;Attention;Memory;Following commands;Safety/judgement;Awareness;Problem solving                 Orientation Level: Disoriented to;Place;Time;Situation Current Attention Level: Sustained Memory: Decreased short-term memory Following Commands: Follows one step commands with increased time Safety/Judgement: Decreased awareness of safety;Decreased awareness of deficits Awareness: Intellectual Problem Solving: Slow processing;Requires verbal cues;Requires tactile cues        Exercises      General Comments General comments (skin  integrity, edema, etc.): Pt was able to participate in reaching across midline activities with L UE towards therapist hand for 10 reps.  Patient required repeated VC to visualize target and stay on task due to confusion.      Pertinent Vitals/Pain Pain Assessment: Faces Faces Pain Scale: Hurts even more Pain Location: R residual limb Pain Descriptors / Indicators: Discomfort;Grimacing;Guarding;Moaning Pain Intervention(s): Limited  activity within patient's tolerance;Monitored during session    Home Living                      Prior Function            PT Goals (current goals can now be found in the care plan section) Acute Rehab PT Goals Patient Stated Goal: none stated PT Goal Formulation: Patient unable to participate in goal setting Time For Goal Achievement: 08/06/18 Potential to Achieve Goals: Fair Progress towards PT goals: Progressing toward goals    Frequency    Min 2X/week      PT Plan Current plan remains appropriate    Co-evaluation              AM-PAC PT "6 Clicks" Daily Activity  Outcome Measure  Difficulty turning over in bed (including adjusting bedclothes, sheets and blankets)?: Unable Difficulty moving from lying on back to sitting on the side of the bed? : Unable Difficulty sitting down on and standing up from a chair with arms (e.g., wheelchair, bedside commode, etc,.)?: Unable Help needed moving to and from a bed to chair (including a wheelchair)?: A Lot Help needed walking in hospital room?: A Lot Help needed climbing 3-5 steps with a railing? : A Lot 6 Click Score: 9    End of Session   Activity Tolerance: Patient limited by pain(limited due to confusion and agitation) Patient left: in bed;with call bell/phone within reach;with bed alarm set Nurse Communication: Mobility status(nurse notified of possible leaking of foley catheter) PT Visit Diagnosis: Muscle weakness (generalized) (M62.81);Other symptoms and signs involving the nervous system (R29.898)     Time: [id number] PT Time Calculation (min) (ACUTE ONLY): 39 min  Charges:  $Therapeutic Activity: 38-52 mins                     [address] N[address] Baker AvenueMegan Zorion Nims, Derek MoundSPTA    Jorgia Manthei 07/28/2018, 4:35 PM

## 2018-07-29 NOTE — Op Note (Signed)
07/30/2018  10:16 AM  PATIENT:  [name]    PRE-OPERATIVE DIAGNOSIS:  dehiscence right below knee amputation  POST-OPERATIVE DIAGNOSIS:  Same  PROCEDURE:  AMPUTATION ABOVE KNEE Application of Praveena incisional wound VAC with restore and customizable dressing  SURGEON:  Nadara MustardMarcus V Duda, MD  PHYSICIAN ASSISTANT:None ANESTHESIA:   General  PREOPERATIVE INDICATIONS:  [name] Parodyamela S Wohler is a  52 y.o. female with a diagnosis of dehiscence right above knee amputation who failed conservative measures and elected for surgical management.    The risks benefits and alternatives were discussed with the patient preoperatively including but not limited to the risks of infection, bleeding, nerve injury, cardiopulmonary complications, the need for revision surgery, among others, and the patient was willing to proceed.  OPERATIVE IMPLANTS: Restore and customizable Praveena wound VAC dressings  @ENCIMAGES @  OPERATIVE FINDINGS: Extremely poor soft tissue envelope with essentially no petechial bleeding  OPERATIVE PROCEDURE: Patient was brought the operating room and underwent a general anesthetic.  After adequate levels anesthesia were obtained patient's right lower extremity was prepped using DuraPrep draped into a sterile field a timeout was called.  The necrotic below the knee amputation was draped out of the sterile field with Ioban that was then covered with impervious stockinette.  A fishmouth incision was made through the distal thigh this was carried sharply down to bone the bone was resected.  The vascular bundles medially were clamped and suture ligated with 2-0 silk.  The above-knee amputation was completed.  There was essentially no petechial bleeding.  There was a few calcified vessels that were bleeding these were touched with electrocautery.  The deep and superficial fascia layers were closed using 2-0 nylon.  A Praveena wound VAC was applied with the customizable dressing on the  incision and the restore dressing this had a good suction fit.  Patient was extubated taken the PACU in stable condition.   DISCHARGE PLANNING:  Antibiotic duration: Continue antibiotics as needed.  Only requires 24 hours of postoperative antibiotics for the right leg  Weightbearing: Patient is nonambulatory  Pain medication: As needed  Dressing care/ Wound VAC: Continue wound VAC for 2 weeks  Ambulatory devices: Not applicable  Discharge to: Skilled nursing  Follow-up: In the office 1 week post operative.

## 2018-07-29 NOTE — NC FL2 (Signed)
[place] MEDICAID FL2 LEVEL OF CARE SCREENING TOOL     IDENTIFICATION  Patient Name: [name] Birthdate: 05-11-1966 Sex: female Admission Date (Current Location): 08/05/2018  [facility] and [place] Number:  [employer] and Address:  [facility], [address] [place], [place] [address]      Provider Number: [id number]  Attending Physician Name and Address:  Shon Hale, MD  Relative Name and Phone Number:  Casimiro Needle, spouse, [phone]    Current Level of Care: Hospital Recommended Level of Care: Skilled Nursing Facility Prior Approval Number:    Date Approved/Denied:   PASRR Number: [id number]  Discharge Plan: SNF    Current Diagnoses: Patient Active Problem List   Diagnosis Date Noted  . Pressure injury of skin 07/23/2018  . Diabetic polyneuropathy associated with type 1 diabetes mellitus (HCC)   . Dehiscence of amputation stump (HCC)   . Osteomyelitis (HCC) 07/30/2018  . Gangrene of right foot (HCC)   . Venous ulcer of right leg (HCC)   . Diabetic ulcer of lower leg (HCC)   . Cellulitis of leg, left 01/04/2018  . Acute on chronic congestive heart failure (HCC)   . HCAP (healthcare-associated pneumonia) 11/14/2017  . Localized swelling of lower extremity 11/05/2017  . Severe protein-calorie malnutrition (HCC) 09/02/2017  . Pneumonia   . Status post kidney transplant 06/02/2017  . Closed displaced fracture of right femoral neck (HCC) 06/02/2017  . Femoral neck fracture (HCC) 06/02/2017  . Hyperglycemia 05/09/2017  . Lower extremity cellulitis 03/23/2017  . Type II diabetes mellitus (HCC) 03/23/2017  . Anemia of chronic disease 01/26/2017  . Abnormal LFTs 01/26/2017  . DKA (diabetic ketoacidoses) (HCC) 08/12/2016  . Hypoglycemia   . Wound cellulitis   . CKD (chronic kidney disease) stage 3, GFR 30-59 ml/min (HCC) 06/26/2016  . Cellulitis of right lower extremity   . QT prolongation 05/13/2016  . UTI (lower  urinary tract infection)   . Hypomagnesemia   . Pressure ulcer 04/11/2016  . Acute encephalopathy 04/11/2016  . Altered mental status 04/10/2016  . Insulin dependent diabetes mellitus (HCC) 04/10/2016  . Diabetic ketoacidosis without coma associated with type 2 diabetes mellitus (HCC) 11/02/2015  . Uncontrolled type 1 diabetes mellitus with foot ulcer (HCC) 06/04/2015  . Foot ulcer, right (HCC)   . Diarrhea 06/02/2015  . Type 1 diabetes mellitus with diabetic foot ulcer (HCC) 06/02/2015  . Acute renal failure superimposed on stage 3 chronic kidney disease (HCC) 06/02/2015  . Diabetic foot infection (HCC) 06/01/2015  . Essential hypertension 06/01/2015  . GERD (gastroesophageal reflux disease) 06/01/2015  . AKI (acute kidney injury) (HCC) 01/02/2015  . H/O kidney transplant 08/04/2012  . Hyponatremia 09/22/2011  . Immunosuppression (HCC)   . Hypothyroidism     Orientation RESPIRATION BLADDER Height & Weight     (Disoriented x4)  Normal Incontinent, Indwelling catheter Weight: 67.4 kg Height:  5\' 8"  (172.7 cm)  BEHAVIORAL SYMPTOMS/MOOD NEUROLOGICAL BOWEL NUTRITION STATUS      Incontinent Diet(Please see DC Summary)  AMBULATORY STATUS COMMUNICATION OF NEEDS Skin   Extensive Assist Verbally PU Stage and Appropriate Care, Wound Vac(Wound vac on leg;)                       Personal Care Assistance Level of Assistance  Bathing, Feeding, Dressing Bathing Assistance: Maximum assistance Feeding assistance: Limited assistance Dressing Assistance: Limited assistance     Functional Limitations Info  Sight, Hearing, Speech Sight Info: Adequate Hearing Info:  Adequate Speech Info: Adequate    SPECIAL CARE FACTORS FREQUENCY  PT (By licensed PT), OT (By licensed OT)     PT Frequency: 5x/week OT Frequency: 3x/week            Contractures Contractures Info: Not present    Additional Factors Info  Code Status, Allergies, Isolation Precautions, Insulin Sliding Scale Code  Status Info: Full Allergies Info: Allergies:  Ace Inhibitors, Adhesive Tape, Latex, Lisinopril   Insulin Sliding Scale Info: 3x daily with meals Isolation Precautions Info: MRSA     Current Medications (17-Jul-2018):  This is the current hospital active medication list Current Facility-Administered Medications  Medication Dose Route Frequency Provider Last Rate Last Dose  . 0.9 %  sodium chloride infusion   Intravenous Continuous Nadara Mustarduda, Marcus V, MD 10 mL/hr at 2018/08/05 1156    . acetaminophen (TYLENOL) tablet 650 mg  650 mg Oral Q6H PRN Nadara Mustarduda, Marcus V, MD   650 mg at 07/28/18 2207   Or  . acetaminophen (TYLENOL) suppository 650 mg  650 mg Rectal Q6H PRN Nadara Mustarduda, Marcus V, MD      . bisacodyl (DULCOLAX) suppository 10 mg  10 mg Rectal Daily PRN Nadara Mustarduda, Marcus V, MD      . cefTRIAXone (ROCEPHIN) 2 g in sodium chloride 0.9 % 100 mL IVPB  2 g Intravenous Q24H Nadara Mustarduda, Marcus V, MD 20 mL/hr at 2018/08/05 0022    . Chlorhexidine Gluconate Cloth 2 % PADS 6 each  6 each Topical Daily Nadara Mustarduda, Marcus V, MD   6 each at 07/28/18 1308  . DAPTOmycin (CUBICIN) 600 mg in sodium chloride 0.9 % IVPB  600 mg Intravenous Q48H Nadara Mustarduda, Marcus V, MD   Stopped at 2018/08/05 0140  . docusate sodium (COLACE) capsule 100 mg  100 mg Oral BID Nadara Mustarduda, Marcus V, MD   100 mg at 2018/08/05 1305  . Gerhardt's butt cream   Topical BID Nadara Mustarduda, Marcus V, MD   1 application at 07/28/18 2158  . haloperidol lactate (HALDOL) injection 2 mg  2 mg Intravenous Q6H PRN Nadara Mustarduda, Marcus V, MD   2 mg at 07/28/18 0110  . hydrALAZINE (APRESOLINE) injection 10 mg  10 mg Intravenous Q6H PRN Nadara Mustarduda, Marcus V, MD   10 mg at 07/26/18 2237  . insulin aspart (novoLOG) injection 0-15 Units  0-15 Units Subcutaneous TID WC Nadara Mustarduda, Marcus V, MD   3 Units at 2018/08/05 [phone]  . insulin aspart (novoLOG) injection 0-5 Units  0-5 Units Subcutaneous QHS Nadara Mustarduda, Marcus V, MD      . labetalol (NORMODYNE,TRANDATE) injection 5 mg  5 mg Intravenous Q2H PRN Nadara Mustarduda, Marcus V, MD      . levothyroxine  (SYNTHROID, LEVOTHROID) tablet 112 mcg  112 mcg Oral QAC breakfast Nadara Mustarduda, Marcus V, MD   112 mcg at 2018/08/05 [phone]  . magnesium citrate solution 1 Bottle  1 Bottle Oral Once PRN Nadara Mustarduda, Marcus V, MD      . methocarbamol (ROBAXIN) tablet 500 mg  500 mg Oral Q6H PRN Nadara Mustarduda, Marcus V, MD   500 mg at 2018/08/05 1305   Or  . methocarbamol (ROBAXIN) 500 mg in dextrose 5 % 50 mL IVPB  500 mg Intravenous Q6H PRN Nadara Mustarduda, Marcus V, MD      . metoCLOPramide (REGLAN) tablet 5-10 mg  5-10 mg Oral Q8H PRN Nadara Mustarduda, Marcus V, MD       Or  . metoCLOPramide (REGLAN) injection 5-10 mg  5-10 mg Intravenous Q8H PRN Nadara Mustarduda, Marcus V, MD   10 mg  at 2018-08-02 1127  . nystatin (MYCOSTATIN/NYSTOP) topical powder   Topical BID Nadara Mustard, MD      . ondansetron [facility]) tablet 4 mg  4 mg Oral Q6H PRN Nadara Mustard, MD       Or  . ondansetron [facility]) injection 4 mg  4 mg Intravenous Q6H PRN Nadara Mustard, MD      . polyethylene glycol (MIRALAX / GLYCOLAX) packet 17 g  17 g Oral Daily PRN Nadara Mustard, MD      . predniSONE (DELTASONE) tablet 5 mg  5 mg Oral Q breakfast Nadara Mustard, MD   5 mg at 07/28/18 0845  . senna-docusate (Senokot-S) tablet 1 tablet  1 tablet Oral Q12H PRN Nadara Mustard, MD      . tacrolimus (PROGRAF) capsule 1 mg  1 mg Oral QPC breakfast Nadara Mustard, MD   1 mg at 07/28/18 0845     Discharge Medications: Please see discharge summary for a list of discharge medications.  Relevant Imaging Results:  Relevant Lab Results:   Additional Information SSN: [id number] [place], [place]

## 2018-07-29 NOTE — Transfer of Care (Signed)
Immediate Anesthesia Transfer of Care Note  Patient: [name]  Procedure(s) Performed: AMPUTATION ABOVE KNEE (Right )  Patient Location: PACU  Anesthesia Type:General  Level of Consciousness: drowsy  Airway & Oxygen Therapy: Patient Spontanous Breathing and Patient connected to face mask oxygen  Post-op Assessment: Report given to RN and Post -op Vital signs reviewed and stable  Post vital signs: Reviewed and stable  Last Vitals:  Vitals Value Taken Time  BP 107/64 18-Mar-2018 10:13 AM  Temp    Pulse 81 18-Mar-2018 10:17 AM  Resp 13 18-Mar-2018 10:17 AM  SpO2 94 % 18-Mar-2018 10:17 AM  Vitals shown include unvalidated device data.  Last Pain:  Vitals:   09/19/2017 0817  TempSrc: Oral  PainSc:       Patients Stated Pain Goal: 0 (07/28/18 2140)  Complications: No apparent anesthesia complications

## 2018-07-30 ENCOUNTER — Inpatient Hospital Stay (HOSPITAL_COMMUNITY): Payer: Medicare Other

## 2018-07-30 DIAGNOSIS — Z95828 Presence of other vascular implants and grafts: Secondary | ICD-10-CM

## 2018-07-30 DIAGNOSIS — T17900A Unspecified foreign body in respiratory tract, part unspecified causing asphyxiation, initial encounter: Secondary | ICD-10-CM

## 2018-07-30 DIAGNOSIS — X58XXXA Exposure to other specified factors, initial encounter: Secondary | ICD-10-CM

## 2018-07-30 DIAGNOSIS — Z940 Kidney transplant status: Secondary | ICD-10-CM

## 2018-07-30 DIAGNOSIS — Z978 Presence of other specified devices: Secondary | ICD-10-CM

## 2018-07-30 DIAGNOSIS — Z89611 Acquired absence of right leg above knee: Secondary | ICD-10-CM

## 2018-07-30 LAB — BPAM PLATELET PHERESIS
BLOOD PRODUCT EXPIRATION DATE: 201911232359
BLOOD PRODUCT EXPIRATION DATE: 201911232359
ISSUE DATE / TIME: 201911220153
ISSUE DATE / TIME: 201911220328
UNIT TYPE AND RH: 5100
Unit Type and Rh: 5100

## 2018-07-30 LAB — PREPARE PLATELET PHERESIS
Unit division: 0
Unit division: 0

## 2018-07-30 LAB — GLUCOSE, CAPILLARY
GLUCOSE-CAPILLARY: 112 mg/dL — AB (ref 70–99)
GLUCOSE-CAPILLARY: 108 mg/dL — AB (ref 70–99)
Glucose-Capillary: 212 mg/dL — ABNORMAL HIGH (ref 70–99)
Glucose-Capillary: 114 mg/dL — ABNORMAL HIGH (ref 70–99)

## 2018-07-30 MED ORDER — IPRATROPIUM-ALBUTEROL 0.5-2.5 (3) MG/3ML IN SOLN
3.0000 mL | RESPIRATORY_TRACT | Status: AC
  Administered 2018-07-30: 3 mL via RESPIRATORY_TRACT
  Filled 2018-07-30: qty 3

## 2018-07-30 MED ORDER — MORPHINE SULFATE (PF) 2 MG/ML IV SOLN
2.0000 mg | INTRAVENOUS | Status: DC | PRN
  Administered 2018-07-30: 2 mg via INTRAVENOUS
  Filled 2018-07-30: qty 1

## 2018-07-30 MED ORDER — ALBUTEROL SULFATE (2.5 MG/3ML) 0.083% IN NEBU
INHALATION_SOLUTION | RESPIRATORY_TRACT | Status: AC
  Filled 2018-07-30: qty 3

## 2018-07-30 MED ORDER — DEXTROSE-NACL 5-0.45 % IV SOLN
INTRAVENOUS | Status: DC
  Administered 2018-07-30 – 2018-07-31 (×2): via INTRAVENOUS
  Administered 2018-08-01: 1000 mL via INTRAVENOUS
  Administered 2018-08-02: 02:00:00 via INTRAVENOUS

## 2018-07-30 MED ORDER — OXYCODONE-ACETAMINOPHEN 5-325 MG PO TABS: 1.0000 | ORAL_TABLET | Freq: Four times a day (QID) | ORAL | Status: DC | PRN

## 2018-07-30 NOTE — Progress Notes (Signed)
Lab draw unsuccessful for multiple sticks, multiple lab techs. Bodenheimer, NP notified.

## 2018-07-30 NOTE — Progress Notes (Signed)
Patient ID: [name], female   DOB: 07-13-1966, 52 y.o.   MRN: [id number] [facility] Progress Note   Assessment/ Plan:   1.  Acute kidney injury on chronic kidney disease stage III T: Likely from volume depletion/ATN associated with possible sepsis.  Labs not done this AM due to inability to obtain sample--will be retried. She remains euvolemic on physical exam and does not have any acute electrolyte abnormality/uremic symptoms to prompt intervention. 2.  Status post deceased donor kidney transplant: Continue ongoing chronic immunosuppressive therapy with Prograf 1 mg daily and prednisone 5 mg daily- elevated trough level noted on 11/17.  Prograf restarted yesterday evening and repeat level checked Monday. 3.  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  She underwent AKA yesterday by Dr. Lajoyce Corners and has a wound VAC in situ. 4.  Encephalopathy-likely metabolic: Suspect that this is largely in part from underlying osteomyelitis/sepsis-anticipated to improve with source control. 5.  Thrombocytopenia: Suspected to be associated with chronic inflammation/medications-no evidence of TTP/DIC.  Appreciate input from hematology.  6.  Anemia: Without overt blood loss, suspect anemia from underlying chronic kidney disease as well as critical illness/malnutrition-inflammatory complex with ongoing osteomyelitis.  She will likely need PRBC transfusion if hemoglobin count continues to drop.  Subjective:   Status post above-knee amputation yesterday- no acute events noted overnight.   Objective:   BP 125/66 (BP Location: Left Arm)   Pulse 95   Temp (!) 97.5 F (36.4 C) (Oral)   Resp 17   Ht 5\' 8"  (1.727 m)   Wt 64.7 kg   LMP 11/19/2011 Comment: pt had tubal ligation  SpO2 95%   BMI 21.69 kg/m   Intake/Output Summary (Last 24 hours) at 07/30/2018 0954 Last data filed at 07/30/2018 0900 Gross per 24 hour  Intake 717.69  ml  Output 725 ml  Net -7.31 ml   Weight change: -2.7 kg  Physical Exam: Gen: Appears to be comfortably resting in bed, appears confused CVS: Pulse regular rhythm, normal rate, S1 and S2 normal Resp: Clear to auscultation bilaterally, no distinct rales or rhonchi Abd: Soft, obese, nontender Ext: Right AKA stump with wound VAC in situ.  No left lower extremity edema.  Imaging: [place] Ekg Site Rite  Result Date: 07/28/2018 If Site Rite image not attached, placement could not be confirmed due to current cardiac rhythm.   Labs: BMET Recent Labs  Lab 07/24/18 0429 07/25/18 0448 07/26/18 0424 07/23/2018 0620 07/28/18 0457 2018-08-16 1133  NA 147* 142 140 139 136 140  K 3.8 3.0* 4.2 3.5 4.1 3.6  CL 112* 108 109 108 107 110  CO2 25 29 23 23  21* 22  GLUCOSE 286* 89 161* 76 145* 107*  BUN 30* 26* 23* 22* 21* 23*  CREATININE 3.23* 2.84* 2.72* 2.47* 2.50* 2.75*  CALCIUM 7.9* 7.6* 8.0* 8.1* 7.7* 8.1*  PHOS  --  2.9 2.9 2.7 2.6 3.0   CBC Recent Labs  Lab 07/26/18 0424 07/16/2018 0620 07/28/18 0457 Aug 16, 2018 1133  WBC 5.6 6.9 6.3 5.5  NEUTROABS 4.9 5.2 4.7 4.0  HGB 9.9* 10.9* 9.4* 7.7*  HCT 32.9* 35.5* 30.6* 25.2*  MCV 89.6 88.3 87.4 90.3  PLT 56* 77* 70* 148*    Medications:    . Chlorhexidine Gluconate Cloth  6 each Topical Daily  . docusate sodium  100 mg Oral BID  . Gerhardt's butt cream   Topical BID  . insulin aspart  0-15 Units Subcutaneous TID WC  .  insulin aspart  0-5 Units Subcutaneous QHS  . levothyroxine  112 mcg Oral QAC breakfast  . nystatin   Topical BID  . predniSONE  5 mg Oral Q breakfast  . tacrolimus  1 mg Oral QPC breakfast   Zetta BillsJay Rohnan Bartleson, MD 07/30/2018, 9:54 AM

## 2018-07-30 NOTE — Progress Notes (Addendum)
INFECTIOUS DISEASE PROGRESS NOTE  ID: [name] is a 52 y.o. female with  Active Problems:   Severe protein-calorie malnutrition ([place])   Osteomyelitis ([place])   Pressure injury of skin   Diabetic polyneuropathy associated with type 1 diabetes mellitus ([place])   Dehiscence of amputation stump (HCC)  Subjective: Aspiration event earlier today  Abtx:  Anti-infectives (From admission, onward)   Start     Dose/Rate Route Frequency Ordered Stop   07/26/2018 0745  ceFAZolin (ANCEF) IVPB 2g/100 mL premix  Status:  Discontinued     2 g 200 mL/hr over 30 Minutes Intravenous On call to O.R. 07/15/2018 0731 07/30/2018 1622   07/26/18 2000  DAPTOmycin (CUBICIN) 600 mg in sodium chloride 0.9 % IVPB     600 mg 224 mL/hr over 30 Minutes Intravenous Every 48 hours 07/25/18 1145     07/24/2018 2000  DAPTOmycin (CUBICIN) 300 mg in sodium chloride 0.9 % IVPB  Status:  Discontinued     300 mg 212 mL/hr over 30 Minutes Intravenous Every 48 hours 07/30/2018 1543 07/25/18 1145   07/26/2018 1800  cefTRIAXone (ROCEPHIN) 2 g in sodium chloride 0.9 % 100 mL IVPB     2 g 200 mL/hr over 30 Minutes Intravenous Every 24 hours 07/25/2018 1543        Medications:  Scheduled: . Chlorhexidine Gluconate Cloth  6 each Topical Daily  . docusate sodium  100 mg Oral BID  . Gerhardt's butt cream   Topical BID  . insulin aspart  0-15 Units Subcutaneous TID WC  . insulin aspart  0-5 Units Subcutaneous QHS  . levothyroxine  112 mcg Oral QAC breakfast  . nystatin   Topical BID  . predniSONE  5 mg Oral Q breakfast  . tacrolimus  1 mg Oral QPC breakfast    Objective: Vital signs in last 24 hours: Temp:  [97.4 F (36.3 C)-97.8 F (36.6 C)] 97.8 F (36.6 C) (11/23 1237) Pulse Rate:  [91-95] 93 (11/23 1227) Resp:  [9-18] 16 (11/23 1227) BP: (125-164)/(66-96) 164/96 (11/23 1237) SpO2:  [93 %-100 %] 93 % (11/23 1227) FiO2 (%):  [50 %-55 %] 50 % (11/23 1237) Weight:  [64.7 kg] 64.7 kg (11/23 0500)   General appearance:  no distress Resp: clear to auscultation bilaterally Cardio: regular rate and rhythm GI: normal findings: bowel sounds normal and soft, non-tender Extremities: RLE stump with VAC in place  Lab Results Recent Labs    07/28/18 0457 07/28/2018 1133  WBC 6.3 5.5  HGB 9.4* 7.7*  HCT 30.6* 25.2*  NA 136 140  K 4.1 3.6  CL 107 110  CO2 21* 22  BUN 21* 23*  CREATININE 2.50* 2.75*   Liver Panel Recent Labs    07/28/18 0457 07/16/2018 1133  ALBUMIN 1.5* 1.6*   Sedimentation Rate No results for input(s): ESRSEDRATE in the last 72 hours. C-Reactive Protein No results for input(s): CRP in the last 72 hours.  Microbiology: Recent Results (from the past 240 hour(s))  MRSA PCR Screening     Status: None   Collection Time: 07/21/2018  2:28 PM  Result Value Ref Range Status   MRSA by PCR NEGATIVE NEGATIVE Final    Comment:        The GeneXpert MRSA Assay (FDA approved for NASAL specimens only), is one component of a comprehensive MRSA colonization surveillance program. It is not intended to diagnose MRSA infection nor to guide or monitor treatment for MRSA infections. Performed at [facility], [place]  [address] [place], [place] [address]   Surgical pcr screen     Status: None   Collection Time: 07/08/2018  6:19 AM  Result Value Ref Range Status   MRSA, PCR NEGATIVE NEGATIVE Final   Staphylococcus aureus NEGATIVE NEGATIVE Final    Comment: (NOTE) The Xpert SA Assay (FDA approved for NASAL specimens in patients 84 years of age and older), is one component of a comprehensive surveillance program. It is not intended to diagnose infection nor to guide or monitor treatment. Performed at [facility], [place] [address] [place], [place] [address]     Studies/Results: [place] Ekg Site Rite  Result Date: 07/28/2018 If [facility] image not attached, placement could not be confirmed due to current cardiac rhythm.    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 Ace Inhibitors Cough  . Adhesive [Tape] Itching and Other (See Comments)    Please use paper tape  . Latex Itching  . Lisinopril Cough    OPAT Orders Discharge antibiotics: ceftriaxone/daptomycin Per pharmacy protocol daptomycin  End Date: 08-05-18  [facility] Care Per Protocol: please  Labs weekly while on IV antibiotics: x__ CBC with differential __ BMP x__ CMP _x_ CRP _x_ ESR __ Vancomycin trough _x_ CK  _x_ Please pull PIC at completion of IV antibiotics __ Please leave PIC in place until doctor has seen patient or been notified  Fax weekly labs to [phone]  Clinic Follow Up Appt: Ortho, pcp          Bobby Rumpf MD, FACP Infectious Diseases (pager) [phone] [email or web address] 07/30/2018, 12:54 PM  LOS: 8 days

## 2018-07-30 NOTE — Evaluation (Signed)
Clinical/Bedside Swallow Evaluation Patient Details  Name: [name] MRN: [id number] Date of Birth: 21-Mar-1966  Today's Date: 07/30/2018 Time: SLP Start Time (ACUTE ONLY): 1458 SLP Stop Time (ACUTE ONLY): 1518 SLP Time Calculation (min) (ACUTE ONLY): 20 min  Past Medical History:  Past Medical History:  Diagnosis Date  . Arthritis    "elbows, knees, legs, back" (09/24/2015)  . CKD (chronic kidney disease)   . Daily headache   . Depression    "years ago"  . End stage renal disease (HCC)    right arm AV graft, resolved post transplant  . Hypertension   . Hypothyroid   . 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 Kidney disease 2011  . Pneumonia ~ 2007?  [name] Type II diabetes mellitus (HCC)    Insulin dependant   Past Surgical History:  Past Surgical History:  Procedure Laterality Date  . ABDOMINAL AORTOGRAM W/LOWER EXTREMITY N/A 01/10/2018   Procedure: ABDOMINAL AORTOGRAM W/LOWER EXTREMITY;  Surgeon: Maeola Harman, MD;  Location: [facility] INVASIVE CV LAB;  Service: Cardiovascular;  Laterality: N/A;  . AMPUTATION Left 05/11/2014   Procedure: AMPUTATION LEFT GREAT TOE;  Surgeon: Kathryne Hitch, MD;  Location: [facility] ORS;  Service: Orthopedics;  Laterality: Left;  . AV FISTULA PLACEMENT Right    forearm  . BACK SURGERY    . CATARACT EXTRACTION W/ INTRAOCULAR LENS  IMPLANT, BILATERAL Bilateral   . CESAREAN SECTION  07/1999  . DG AV DIALYSIS GRAFT DECLOT OR    . HIP PINNING,CANNULATED Right 06/02/2017   Procedure: CANNULATED RIGHT HIP PINNING;  Surgeon: Beverely Low, MD;  Location: [facility] ORS;  Service: Orthopedics;  Laterality: Right;  . INCISION AND DRAINAGE Right 06/02/2015   Procedure: INCISION AND DRAINAGE OF RIGHT 3rd RAY RESECTION;  Surgeon: Kathryne Hitch, MD;  Location: [facility] OR;  Service: Orthopedics;  Laterality: Right;  . 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 LUMBAR DISC SURGERY  2001  . NEPHRECTOMY TRANSPLANTED ORGAN    . TOE  AMPUTATION Right    1,2 & 3rd toes.  . TUBAL LIGATION  07/1999   HPI:  52 y.o. 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 Pt seen by SLP during prior admissions; MBS 11/16/17 revealed functional oral stage, delayed swallow reflex with trace penetration of thin liquids. Thin liquids with chin tuck and dysphagia 2 recommended (due to oral sores). Swallow evaluation ordered as pt noted to cough and desat 15-20 minutes after taking pills in puree. CXR 07/30/18 Increased left mid and lower lung mixed interstitial and airspace opacity with a small left pleural effusion concerning for asymmetric edema or pneumonia   Assessment / Plan / Recommendation Clinical Impression  Pt's current mentation and lethargy place her at risk for aspiration. She is inconsistently following commands, unable to sustain attention to PO trials attempted. Lips are bleeding and ulcerated, and there are sores inside pt's mouth. Tongue is dry and coated. Pt wincing and withdrawing from most attempts at oral care. Pt accepted single ice chip, holding orally for a prolonged period, with what appeared to be delayed swallow as she tilted into neck extension. Subsequently, pt with change in mentation and unable to maintain upright posture; further PO trials not appropriate. Recommend she remain  NPO, will follow up next date for PO readiness pending improvements in mentation/cooperation. D/w RN.    SLP Visit Diagnosis: Dysphagia, unspecified (R13.10)    Aspiration Risk  Moderate aspiration risk    Diet Recommendation NPO   Medication Administration: Via alternative means    Other  Recommendations Oral Care Recommendations: Oral care QID Other Recommendations: Have oral suction available   Follow up  Recommendations Other (comment)(tbd)      Frequency and Duration min 2x/week  2 weeks       Prognosis Prognosis for Safe Diet Advancement: Good Barriers to Reach Goals: Cognitive deficits;Behavior      Swallow Study   General Date of Onset: 07/23/2018 HPI: 52 y.o. 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  s/p R AKA on 02/12/2018. Pt seen by SLP during prior admissions; MBS 11/16/17 revealed functional oral stage, delayed swallow reflex with trace penetration of thin liquids. Thin liquids with chin tuck and dysphagia 2 recommended (due to oral sores). Swallow evaluation ordered as pt noted to cough and desat 15-20 minutes after taking pills in puree. CXR 07/30/18 Increased left mid and lower lung mixed interstitial and airspace opacity with a small left pleural effusion concerning for asymmetric edema or pneumonia Type of Study: Bedside Swallow Evaluation Previous Swallow Assessment: see HPI Diet Prior to this Study: NPO Temperature Spikes Noted: No Respiratory Status: Nasal cannula History of Recent Intubation: No Behavior/Cognition: Lethargic/Drowsy;Distractible;Doesn't follow directions;Agitated;Confused Oral Cavity Assessment: Lesions;Dried secretions;Dry Oral Care Completed by SLP: Yes Oral Cavity - Dentition: Missing dentition;Poor condition Self-Feeding Abilities: Total assist Patient Positioning: Upright in bed Baseline Vocal Quality: Normal Volitional Cough: Cognitively unable to elicit Volitional Swallow: Unable to elicit    Oral/Motor/Sensory Function Overall Oral Motor/Sensory Function: Generalized oral weakness   Ice Chips Ice chips: Impaired Presentation: Spoon Oral Phase Impairments: Poor awareness of bolus;Reduced lingual movement/coordination Oral Phase  Functional Implications: Prolonged oral transit Pharyngeal Phase Impairments: Suspected delayed Swallow   Thin Liquid Thin Liquid: Not tested    Nectar Thick Nectar Thick Liquid: Not tested   Honey Thick Honey Thick Liquid: Not tested   Puree Puree: Not tested Other Comments: pt refused   Solid     Solid: Not tested     Rondel BatonMary Beth Georgeanne Frankland, MS, CCC-SLP Speech-Language Pathologist Acute Rehabilitation Services Pager: [phone] Office: [phone]  Arlana LindauMary E Chancey Cullinane 07/30/2018,3:29 PM

## 2018-07-30 NOTE — Progress Notes (Signed)
   Subjective: 1 Day Post-Op Procedure(s) (LRB): AMPUTATION ABOVE KNEE (Right) Patient reports pain as 10 on 0-10 scale.  Crying in pain.   Objective: Vital signs in last 24 hours: Temp:  [97.4 F (36.3 C)-97.6 F (36.4 C)] 97.5 F (36.4 C) (11/23 0604) Pulse Rate:  [90-95] 91 (11/23 1100) Resp:  [9-18] 15 (11/23 1100) BP: (125-158)/(66-94) 158/94 (11/23 1100) SpO2:  [95 %-100 %] 99 % (11/23 1100) Weight:  [64.7 kg] 64.7 kg (11/23 0500)  Intake/Output from previous day: 11/22 0701 - 11/23 0700 In: 696 [P.O.:120; I.V.:476; IV Piggyback:100] Out: 725 [Urine:675; Blood:50] Intake/Output this shift: Total I/O In: 226.7 [I.V.:201.7; Other:25] Out: 200 [Urine:200]  Recent Labs    07/28/18 0457 07/26/2018 1133  HGB 9.4* 7.7*   Recent Labs    07/28/18 0457 07/13/2018 1133  WBC 6.3 5.5  RBC 3.50* 2.79*  HCT 30.6* 25.2*  PLT 70* 148*   Recent Labs    07/28/18 0457 07/30/2018 1133  NA 136 140  K 4.1 3.6  CL 107 110  CO2 21* 22  BUN 21* 23*  CREATININE 2.50* 2.75*  GLUCOSE 145* 107*  CALCIUM 7.7* 8.1*   No results for input(s): LABPT, INR in the last 72 hours.  VAC intact. No results found.  Assessment/Plan: 1 Day Post-Op Procedure(s) (LRB): AMPUTATION ABOVE KNEE (Right) Plan:  ABX , percocet ordered, she has had it before.   Eldred MangesMark C Jaleiah Asay 07/30/2018, 11:24 AM

## 2018-07-30 NOTE — Progress Notes (Signed)
Patient Demographics:    [name], is a 52 y.o. female, DOB - 1966/02/25, XBJ:[id number]  Admit date - 2018/07/09   Admitting Physician Kendell BaneSeyed A Shahmehdi, MD  Outpatient Primary MD for the patient is Dois Davenportichter, Karen L, MD  LOS - 8   No chief complaint on file.       Subjective:    [name] Dickamela Kirby today has no fevers, no emesis,  No chest pain, pleasantly confused, for right AKA  Assessment  & Plan :    Active Problems:   Severe protein-calorie malnutrition (HCC)   Osteomyelitis (HCC)   Pressure injury of skin   Diabetic polyneuropathy associated with type 1 diabetes mellitus (HCC)   Dehiscence of amputation stump (HCC)  Brief Summary:- [name] is 52 yo female with h/o severe PAD, renal transplant on tac/ pred, CKD III,  H/o culture negative osteomyelitis of her previous right BKA 05/11/18 [facility] 2018[facility]07[facility]09 with toxic metabolic encephalopathy and right BKA wound dehiscence/infection and osteomyelitis , on daptomycin and ceftriaxone ,       Plan:- 1)Sepsis secondary to Rt BKA wound Dehiscence and Osteomyelitis----infectious disease consult appreciated, continue IV Rocephin and daptomycin, plan is for right AKA, no fever or leukocytosis at this time. s/p right AKA  07/28/2018, physical disease consultant advises continuing IV daptomycin/Rocephin through 08/05/2018 (for 7 days post amputation date)  2)Toxic metabolic encephalopathy--- multifactorial, suspect secondary to AKI on CKD, as well as infection/osteomyelitis of the right BKA stump with sepsis  3)Acute on chronic Anemia--- at baseline patient has chronic anemia of CKD, EPO agent as per nephrologist, monitor H&H closely and transfuse as clinically indicated, hemoglobin is down to 7.7, repeat CBC ordered  4)S/p Deceased Donor Kidney TRansplant --- patient now with AKI superimposed on CKD 3 --- suspect due to  dehydration/volume depletion continue tacrolimus and prednisone, nephrology input appreciated,  creatinine on admission= 3.3 ,   baseline creatinine = 2.4 to 2.6    , creatinine is now= 2.75    , renally adjust medications, avoid nephrotoxic agents/dehydration/hypotension, repeat BMP on 07/31/2017   5)Thrombocytopenia--- status post transfusion of Nplate on 07/26/2018 and transfusion of 1 unit of platelets on 07/26/18, goal is to keep platelets above 100 k preop,pt got transfuse 2 units of platelets overnight on 07/28/18 , Rt AKA surgery is planned for 07/11/2018, platelet count is up to 148K, repeat CBC on 07/31/2018  6) acute hypoxic respiratory failure--- patient had episode of acute hypoxia presumed aspiration related after she was given medications with applesauce, n.p.o. for now, get speech and swallow evaluation, consider broadening antibiotics to include anaerobes if fevers or worsening respiratory status, overall improved after suctioning and nebulizer treatment  Disposition/Need for in-Hospital Stay- patient unable to be discharged at this time due to needing IV antibiotics and revision of a right BKA to AKA due to sepsis/wound dehiscence/infection/osteomyelitis, awaiting SNF Placement once she is improved medically  Code Status : Full  Disposition Plan  : TBD  Consults  :  Ortho/ID/Nephrology   DVT Prophylaxis  :  ( SCDs on Lt), thrombocytopenia so no heparin  Lab Results  Component Value Date   PLT 148 (L) 08/02/2018    Inpatient Medications  Scheduled Meds: . Chlorhexidine Gluconate Cloth  6 each  Topical Daily  . docusate sodium  100 mg Oral BID  . Gerhardt's butt cream   Topical BID  . insulin aspart  0-15 Units Subcutaneous TID WC  . insulin aspart  0-5 Units Subcutaneous QHS  . levothyroxine  112 mcg Oral QAC breakfast  . nystatin   Topical BID  . predniSONE  5 mg Oral Q breakfast  . tacrolimus  1 mg Oral QPC breakfast   Continuous Infusions: . sodium chloride 10  mL/hr at 07/30/18 0900  . cefTRIAXone (ROCEPHIN)  IV Stopped (08/26/2018 1916)  . DAPTOmycin (CUBICIN)  IV Stopped (August 26, 2018 0140)  . dextrose 5 % and 0.45% NaCl 75 mL/hr at 07/30/18 1500  . methocarbamol (ROBAXIN) IV     PRN Meds:.acetaminophen **OR** acetaminophen, bisacodyl, haloperidol lactate, hydrALAZINE, labetalol, magnesium citrate, methocarbamol **OR** methocarbamol (ROBAXIN) IV, metoCLOPramide **OR** metoCLOPramide (REGLAN) injection, ondansetron **OR** ondansetron (ZOFRAN) IV, oxyCODONE-acetaminophen, polyethylene glycol, senna-docusate    Anti-infectives (From admission, onward)   Start     Dose/Rate Route Frequency Ordered Stop   07/20/2018 0745  ceFAZolin (ANCEF) IVPB 2g/100 mL premix  Status:  Discontinued     2 g 200 mL/hr over 30 Minutes Intravenous On call to O.R. 07/26/2018 0731 07/20/2018 1622   07/26/18 2000  DAPTOmycin (CUBICIN) 600 mg in sodium chloride 0.9 % IVPB     600 mg 224 mL/hr over 30 Minutes Intravenous Every 48 hours 07/25/18 1145     07/23/2018 2000  DAPTOmycin (CUBICIN) 300 mg in sodium chloride 0.9 % IVPB  Status:  Discontinued     300 mg 212 mL/hr over 30 Minutes Intravenous Every 48 hours 07/23/2018 1543 07/25/18 1145   07/23/2018 1800  cefTRIAXone (ROCEPHIN) 2 g in sodium chloride 0.9 % 100 mL IVPB     2 g 200 mL/hr over 30 Minutes Intravenous Every 24 hours 07/26/2018 1543          Objective:   Vitals:   07/30/18 1227 07/30/18 1235 07/30/18 1237 07/30/18 1400  BP:   (!) 164/96   Pulse: 93   88  Resp: 16   14  Temp:   97.8 F (36.6 C)   TempSrc:  Oral Axillary   SpO2: 93%   100%  Weight:      Height:        Wt Readings from Last 3 Encounters:  07/30/18 64.7 kg  01/11/18 76.4 kg  01/01/18 83.9 kg     Intake/Output Summary (Last 24 hours) at 07/30/2018 1551 Last data filed at 07/30/2018 1500 Gross per 24 hour  Intake 774.91 ml  Output 975 ml  Net -200.09 ml     Physical Exam Patient is examined daily including today on 07/30/18 , exams  remain the same as of yesterday except that has changed   Gen:- Awake Alert, remains pleasantly confused, HEENT:- [place].AT, No sclera icterus Neck-Supple Neck,No JVD,.  Nose-  3 L/min Lungs-diminished bases wheezing improved after nebulizer treatment  CV- S1, S2 normal, regular  Abd-  +ve B.Sounds, Abd Soft, No tenderness,    Extremity/Skin:-Right  AKA stump with dressing over it, left foot with missing big toe Psych-affect is flat, pleasantly confused, oriented to self Neuro-no new focal deficits, no tremors   Data Review:   Micro Results Recent Results (from the past 240 hour(s))  MRSA PCR Screening     Status: None   Collection Time: 08/05/2018  2:28 PM  Result Value Ref Range Status   MRSA by PCR NEGATIVE NEGATIVE Final    Comment:  The GeneXpert MRSA Assay (FDA approved for NASAL specimens only), is one component of a comprehensive MRSA colonization surveillance program. It is not intended to diagnose MRSA infection nor to guide or monitor treatment for MRSA infections. Performed at [facility], [address] [place], [place] [address]   Surgical pcr screen     Status: None   Collection Time: 07/26/2018  6:19 AM  Result Value Ref Range Status   MRSA, PCR NEGATIVE NEGATIVE Final   Staphylococcus aureus NEGATIVE NEGATIVE Final    Comment: (NOTE) The Xpert SA Assay (FDA approved for NASAL specimens in patients 34 years of age and older), is one component of a comprehensive surveillance program. It is not intended to diagnose infection nor to guide or monitor treatment. Performed at [facility], [address], [place], [place] [address]     Radiology Reports Ct Head Wo Contrast  Result Date: 07/25/2018 CLINICAL DATA:  52 year old female with altered level of consciousness. EXAM: CT HEAD WITHOUT CONTRAST TECHNIQUE: Contiguous axial images were obtained from the base of the skull through the vertex without intravenous contrast. COMPARISON:   07/19/2018 FINDINGS: Motif shin artifact decreases sensitivity despite rescanning. Brain: No evidence of acute infarction, hemorrhage, hydrocephalus, extra-axial collection or mass lesion/mass effect. Mild atrophy and chronic small-vessel white matter ischemic changes again noted. Vascular: Vertebral and carotid atherosclerotic calcifications noted. Skull: No acute abnormality. Sinuses/Orbits: No acute abnormality. Mucoperiosteal thickening of the paranasal sinuses again noted. Other: None IMPRESSION: 1. No evidence of acute intracranial abnormality 2. Mild atrophy and chronic small-vessel white matter ischemic changes 3. Chronic paranasal sinus disease. Electronically Signed   By: Harmon Pier M.D.   On: 07/25/2018 12:01   Dg Chest Port 1 View  Result Date: 07/30/2018 CLINICAL DATA:  Hypoxia EXAM: PORTABLE CHEST 1 VIEW COMPARISON:  07/20/2018 FINDINGS: Exam is rotated to the left. Increased left mid and lower lung mixed interstitial and airspace opacities concerning for developing left lung pneumonia or asymmetric edema. Right lung remains clear. Small left effusion not excluded. No large pneumothorax. Trachea is midline. Aorta atherosclerotic. Bones are osteopenic. IMPRESSION: Increased left mid and lower lung mixed interstitial and airspace opacity with a small left pleural effusion concerning for asymmetric edema or pneumonia. Atherosclerosis Electronically Signed   By: Judie Petit.  Shick M.D.   On: 07/30/2018 13:27   [place] Ekg Site Rite  Result Date: 07/28/2018 If Site Rite image not attached, placement could not be confirmed due to current cardiac rhythm.    CBC Recent Labs  Lab 07/25/18 0448 07/26/18 0424 07/10/2018 0620 07/28/18 0457 08-19-2018 1133  WBC 5.8 5.6 6.9 6.3 5.5  HGB 8.2* 9.9* 10.9* 9.4* 7.7*  HCT 27.6* 32.9* 35.5* 30.6* 25.2*  PLT 45* 56* 77* 70* 148*  MCV 90.8 89.6 88.3 87.4 90.3  MCH 27.0 27.0 27.1 26.9 27.6  MCHC 29.7* 30.1 30.7 30.7 30.6  RDW 19.5* 18.7* 18.3* 18.2* 18.6*    LYMPHSABS 0.7 0.5* 1.1 1.1 1.0  MONOABS 0.3 0.2 0.4 0.4 0.5  EOSABS 0.0 0.0 0.2 0.1 0.1  BASOSABS 0.0 0.0 0.0 0.0 0.0    Chemistries  Recent Labs  Lab 07/25/18 0448 07/26/18 0424 07/21/2018 0620 07/28/18 0457 08-19-18 1133  NA 142 140 139 136 140  K 3.0* 4.2 3.5 4.1 3.6  CL 108 109 108 107 110  CO2 29 23 23  21* 22  GLUCOSE 89 161* 76 145* 107*  BUN 26* 23* 22* 21* 23*  CREATININE 2.84* 2.72* 2.47* 2.50* 2.75*  CALCIUM  7.6* 8.0* 8.1* 7.7* 8.1*  MG 1.7  --   --   --   --    ------------------------------------------------------------------------------------------------------------------ No results for input(s): CHOL, HDL, LDLCALC, TRIG, CHOLHDL, LDLDIRECT in the last 72 hours.  Lab Results  Component Value Date   HGBA1C 6.2 (H) 07/26/2018   ------------------------------------------------------------------------------------------------------------------ No results for input(s): TSH, T4TOTAL, T3FREE, THYROIDAB in the last 72 hours.  Invalid input(s): FREET3 ------------------------------------------------------------------------------------------------------------------ No results for input(s): VITAMINB12, FOLATE, FERRITIN, TIBC, IRON, RETICCTPCT in the last 72 hours.  Coagulation profile Recent Labs  Lab 07/25/18 0448  INR 1.28    No results for input(s): DDIMER in the last 72 hours.  Cardiac Enzymes No results for input(s): CKMB, TROPONINI, MYOGLOBIN in the last 168 hours.  Invalid input(s): CK ------------------------------------------------------------------------------------------------------------------    Component Value Date/Time   BNP 958.8 (H) 11/14/2017 1313    Shon Hale M.D on 07/30/2018 at 3:51 PM  Pager---[phone] Go to [email or web address] - password [email or web address] for contact info  [facility] - Office  [phone]

## 2018-07-30 NOTE — Progress Notes (Signed)
Called in by another RN. Pt sats in the 70's and pt coughing. Had gotten meds with applesauce  15-20 mins prior. O2 applied and  escalted to 50% FM to obtain sats > 90%. Dr Salvadore OxfordEmopak notified.

## 2018-07-30 NOTE — Clinical Social Work Note (Signed)
Clinical Social Work Assessment  Patient Details  Name: [name] MRN: [id number] Date of Birth: [birth year]  Date of referral:  08/03/2018               Reason for consult:  Facility Placement                Permission sought to share information with:  Facility Medical sales representativeContact Representative, Family Supports Permission granted to share information::  No  Name::     Casimiro NeedleMichael  Agency::  [facility]   Relationship::  Spouse  Contact Information:  [phone]  Housing/Transportation Living arrangements for the past 2 months:  Skilled Nursing Facility Source of Information:  Spouse Patient Interpreter Needed:  None Criminal Activity/Legal Involvement Pertinent to Current Situation/Hospitalization:  No - Comment as needed Significant Relationships:  Spouse Lives with:  Facility Resident Do you feel safe going back to the place where you live?  Yes Need for family participation in patient care:  Yes (Comment)  Care giving concerns:  Dependent with mobility, recommended return to [facility] at time of discharge.   Social Worker assessment / plan:  SW spoke with patient's husband Casimiro NeedleMichael to assess for returning to [facility] when medically stable. Patient's husband states patient has no complaints about her stay at [facility] and is agreeable to go back. SW obtained permission to fax out patient to [facility] for acceptance. SW encouraged Casimiro NeedleMichael to reach out to staff if questions or concerns arise, he was agreeable.  Employment status:  Disabled (Comment on whether or not currently receiving Disability) Insurance information:  Medicare PT Recommendations:  Skilled Nursing Facility Information / Referral to community resources:  Skilled Nursing Facility  Patient/Family's Response to care:  Understanding and accepting.  Patient/Family's Understanding of and Emotional Response to Diagnosis, Current Treatment, and Prognosis:  No concerns noted.  Emotional Assessment Appearance:  Appears older than stated  age Attitude/Demeanor/Rapport:  Unable to Assess Affect (typically observed):  Unable to Assess Orientation:  (Disoriented x4) Alcohol / Substance use:  Not Applicable Psych involvement (Current and /or in the community):  No (Comment)  Discharge Needs  Concerns to be addressed:  Care Coordination Readmission within the last 30 days:  No Current discharge risk:  Dependent with Mobility Barriers to Discharge:  Continued Medical Work up  Edwin Dadaarol Eduardo Wurth, MSW Medical Social Worker [facility]

## 2018-07-30 NOTE — Progress Notes (Signed)
Physical Therapy Re-evaluation Patient Details Name: [name] MRN: [id number] DOB: Jan 28, 1966 Today's Date: 07/30/2018    History of Present Illness 52 y.o. 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 Pt currently with functional limitations due to confusion, and balance and endurance deficits. Pt was unable to participate today due to confusion and anxiety due to pain.  Pt only able to sit up on EOB 3 minutes with cues and max to total assist.  Pt continues to be limited by confusion, pain and weakness.   Re-eval completed today and goals updated.  0/3 goals met due to pt with medical issues with right AKA 11/22 due to continued necrosis and osteomyelitis of right residual limb.  Wound VAC in place.  Will follow acutely and progrss pt as able.  Pt will benefit from skilled PT to increase their independence and safety with mobility to allow discharge to the venue listed below.    Follow Up Recommendations  SNF;Supervision/Assistance - 24 hour     Equipment Recommendations  Other (comment)(TBA)    Recommendations for Other Services       Precautions / Restrictions Precautions Precautions: Fall Precaution Comments: right AKA with wound VAC in place Restrictions Weight Bearing Restrictions: No    Mobility  Bed Mobility Overal bed mobility: Needs Assistance Bed Mobility: Rolling;Sidelying to Sit;Sit to Supine Rolling: Max assist;+2 for physical assistance Sidelying to sit: Max assist;+2 for physical assistance   Sit to supine: Max assist;+2 for physical assistance   General bed mobility comments: Pt required repeated verbal cueing to reach for bed railing and to roll several times during  treatment.  Patient was able to minimally grip bed railing for assistance.  Patient required max A to move L LE over EOB.  Patient required max A +2 for elevation and support of trunk into sitting.  Patient required max A to raise L LE against gravity from sitting to supine.  Transfers                 General transfer comment: unable to attempt due to max assist to sit EOB and can't attempt further mobility  Ambulation/Gait             General Gait Details: unable to perform   Stairs             Wheelchair Mobility    Modified Rankin (Stroke Patients Only)       Balance Overall balance assessment: Needs assistance Sitting-balance support: Bilateral upper extremity supported;Feet supported Sitting balance-Leahy Scale: Poor Sitting balance - Comments: Pt was able to sit EOB for approximately 3 mins requiring B UE support from end of bed railing and bed surface.  pt also required external assist from therapist to maintain balance as pt anxious and resisting sitting upright.  Pt loses balance in all directions.  Needs constant cues and support.  Pt reports her foley hurts.  LEt nurse know that pt c/o pain when she was urinating.  Cognition Arousal/Alertness: Awake/alert Behavior During Therapy: Agitated(confused) Overall Cognitive Status: Impaired/Different from baseline Area of Impairment: Orientation;Attention;Memory;Following commands;Safety/judgement;Awareness;Problem solving                 Orientation Level: Disoriented to;Place;Time;Situation Current Attention Level: Sustained Memory: Decreased short-term memory Following Commands: Follows one step commands with increased time Safety/Judgement: Decreased awareness of safety;Decreased awareness of deficits Awareness: Intellectual Problem Solving: Slow processing;Requires verbal cues;Requires tactile cues General Comments: pt is still incontinent of  stool      Exercises      General Comments General comments (skin integrity, edema, etc.): Pt with multiple foam dressings on       Pertinent Vitals/Pain Pain Assessment: Faces Faces Pain Scale: Hurts even more Pain Location: R residual limb Pain Descriptors / Indicators: Discomfort;Grimacing;Guarding;Moaning Pain Intervention(s): Limited activity within patient's tolerance;Monitored during session;Repositioned    Home Living                      Prior Function            PT Goals (current goals can now be found in the care plan section) Acute Rehab PT Goals Patient Stated Goal: none stated PT Goal Formulation: Patient unable to participate in goal setting Time For Goal Achievement: 08/20/18 Potential to Achieve Goals: Fair Progress towards PT goals: Not progressing toward goals - comment(Confusion and anxiety limit pts ability to participate)    Frequency    Min 2X/week      PT Plan Current plan remains appropriate    Co-evaluation              AM-PAC PT "6 Clicks" Mobility   Outcome Measure  Help needed turning from your back to your side while in a flat bed without using bedrails?: Total Help needed moving from lying on your back to sitting on the side of a flat bed without using bedrails?: Total Help needed moving to and from a bed to a chair (including a wheelchair)?: Total Help needed standing up from a chair using your arms (e.g., wheelchair or bedside chair)?: Total Help needed to walk in hospital room?: Total Help needed climbing 3-5 steps with a railing? : Total 6 Click Score: 6    End of Session Equipment Utilized During Treatment: Gait belt;Oxygen Activity Tolerance: Patient limited by pain(limited due to confusion and agitation) Patient left: in bed;with call bell/phone within reach;with bed alarm set Nurse Communication: Mobility status;Need for lift equipment PT Visit Diagnosis: Muscle weakness (generalized) (M62.81);Other  symptoms and signs involving the nervous system (R29.898)     Time: 7681-1572 PT Time Calculation (min) (ACUTE ONLY): 17 min  Charges:                        [place] Pager:  [phone]  Office:  [place] 07/30/2018, 2:14 PM

## 2018-07-31 LAB — RENAL FUNCTION PANEL
Albumin: 1.3 g/dL — ABNORMAL LOW (ref 3.5–5.0)
Anion gap: 13 (ref 5–15)
BUN: 23 mg/dL — AB (ref 6–20)
CHLORIDE: 110 mmol/L (ref 98–111)
CO2: 17 mmol/L — AB (ref 22–32)
CREATININE: 2.85 mg/dL — AB (ref 0.44–1.00)
Calcium: 8.1 mg/dL — ABNORMAL LOW (ref 8.9–10.3)
GFR, EST AFRICAN AMERICAN: 21 mL/min — AB (ref >60)
GFR, EST NON AFRICAN AMERICAN: 18 mL/min — AB (ref >60)
Glucose, Bld: 185 mg/dL — ABNORMAL HIGH (ref 70–99)
POTASSIUM: 3.9 mmol/L (ref 3.5–5.1)
Phosphorus: 3.6 mg/dL (ref 2.5–4.6)
Sodium: 140 mmol/L (ref 135–145)

## 2018-07-31 LAB — CBC
HEMATOCRIT: 27.4 % — AB (ref 36.0–46.0)
HEMOGLOBIN: 7.8 g/dL — AB (ref 12.0–15.0)
MCH: 26.3 pg (ref 26.0–34.0)
MCHC: 28.5 g/dL — ABNORMAL LOW (ref 30.0–36.0)
MCV: 92.3 fL (ref 80.0–100.0)
NRBC: 0 % (ref 0.0–0.2)
Platelets: 111 10*3/uL — ABNORMAL LOW (ref 150–400)
RBC: 2.97 MIL/uL — ABNORMAL LOW (ref 3.87–5.11)
RDW: 19.1 % — ABNORMAL HIGH (ref 11.5–15.5)
WBC: 10.5 10*3/uL (ref 4.0–10.5)

## 2018-07-31 LAB — GLUCOSE, CAPILLARY
GLUCOSE-CAPILLARY: 217 mg/dL — AB (ref 70–99)
GLUCOSE-CAPILLARY: 181 mg/dL — AB (ref 70–99)
GLUCOSE-CAPILLARY: 101 mg/dL — AB (ref 70–99)
Glucose-Capillary: 67 mg/dL — ABNORMAL LOW (ref 70–99)

## 2018-07-31 LAB — CK: Total CK: 60 U/L (ref 38–234)

## 2018-07-31 IMAGING — CR DG FOREARM 2V*L*
2 series · 2 of 2 positions shown · non-contrast
Comparison: None.

CLINICAL DATA: Fell 2 days ago with pain in the right forearm.

EXAM:
LEFT FOREARM - 2 VIEW

[x forearm ap left]
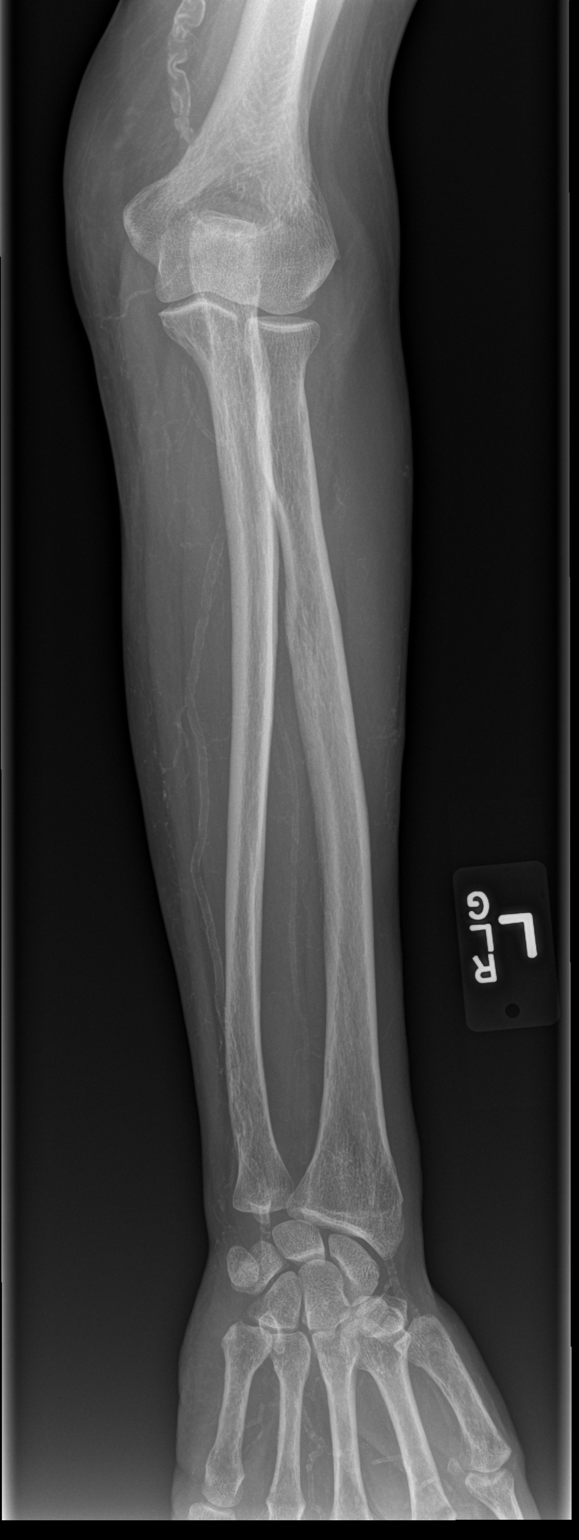

[x forearm lat left]
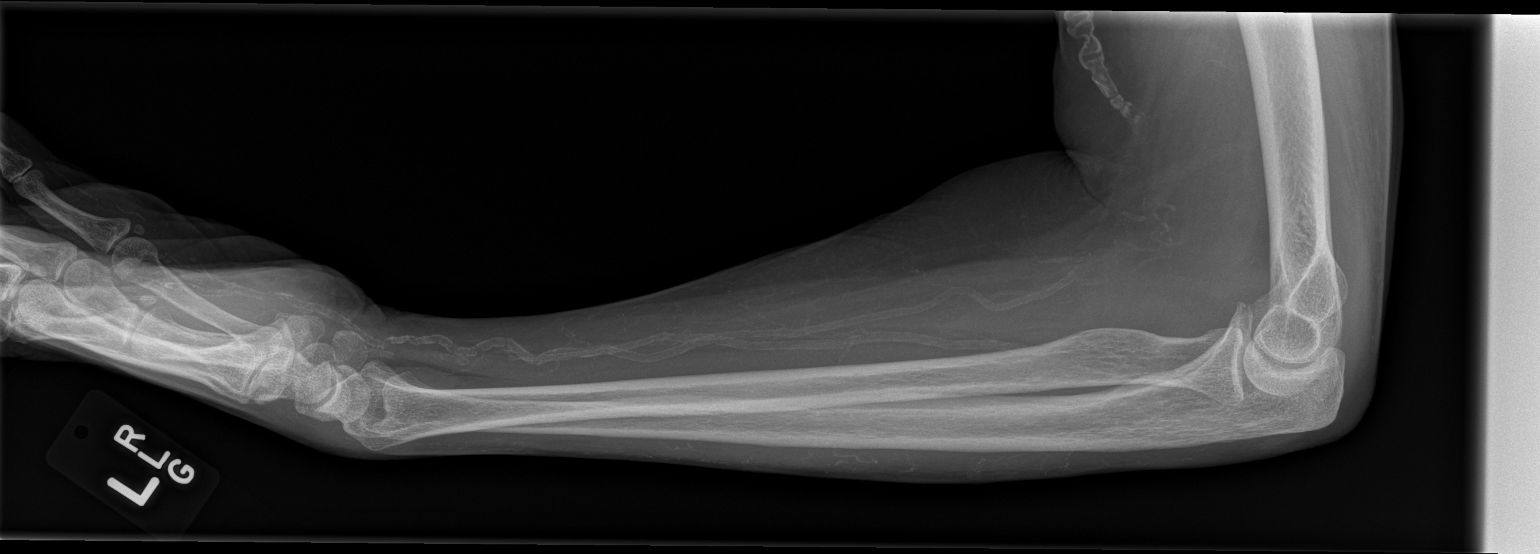

[2 of 2 positions shown; findings below may reference images not displayed]

FINDINGS: There is no evidence of fracture or other focal bone lesions.
Question the presence of an elbow effusion. Does the patient have a
elbow joint pain? If so, elbow films would be suggested. Regional
arterial calcification incidentally noted.
IMPRESSION: No fracture seen. Question elbow joint effusion. If there is pain in
the elbow a region, elbow films would be suggested.

## 2018-07-31 IMAGING — CR DG CHEST 2V
2 series · 2 of 2 positions shown · non-contrast
Comparison: PA and lateral chest 08/12/2016 and 01/02/2015.

CLINICAL DATA: Status post fall onto the right side going up a ramp
10/10/2016.

EXAM:
CHEST  2 VIEW

[w chest pa]
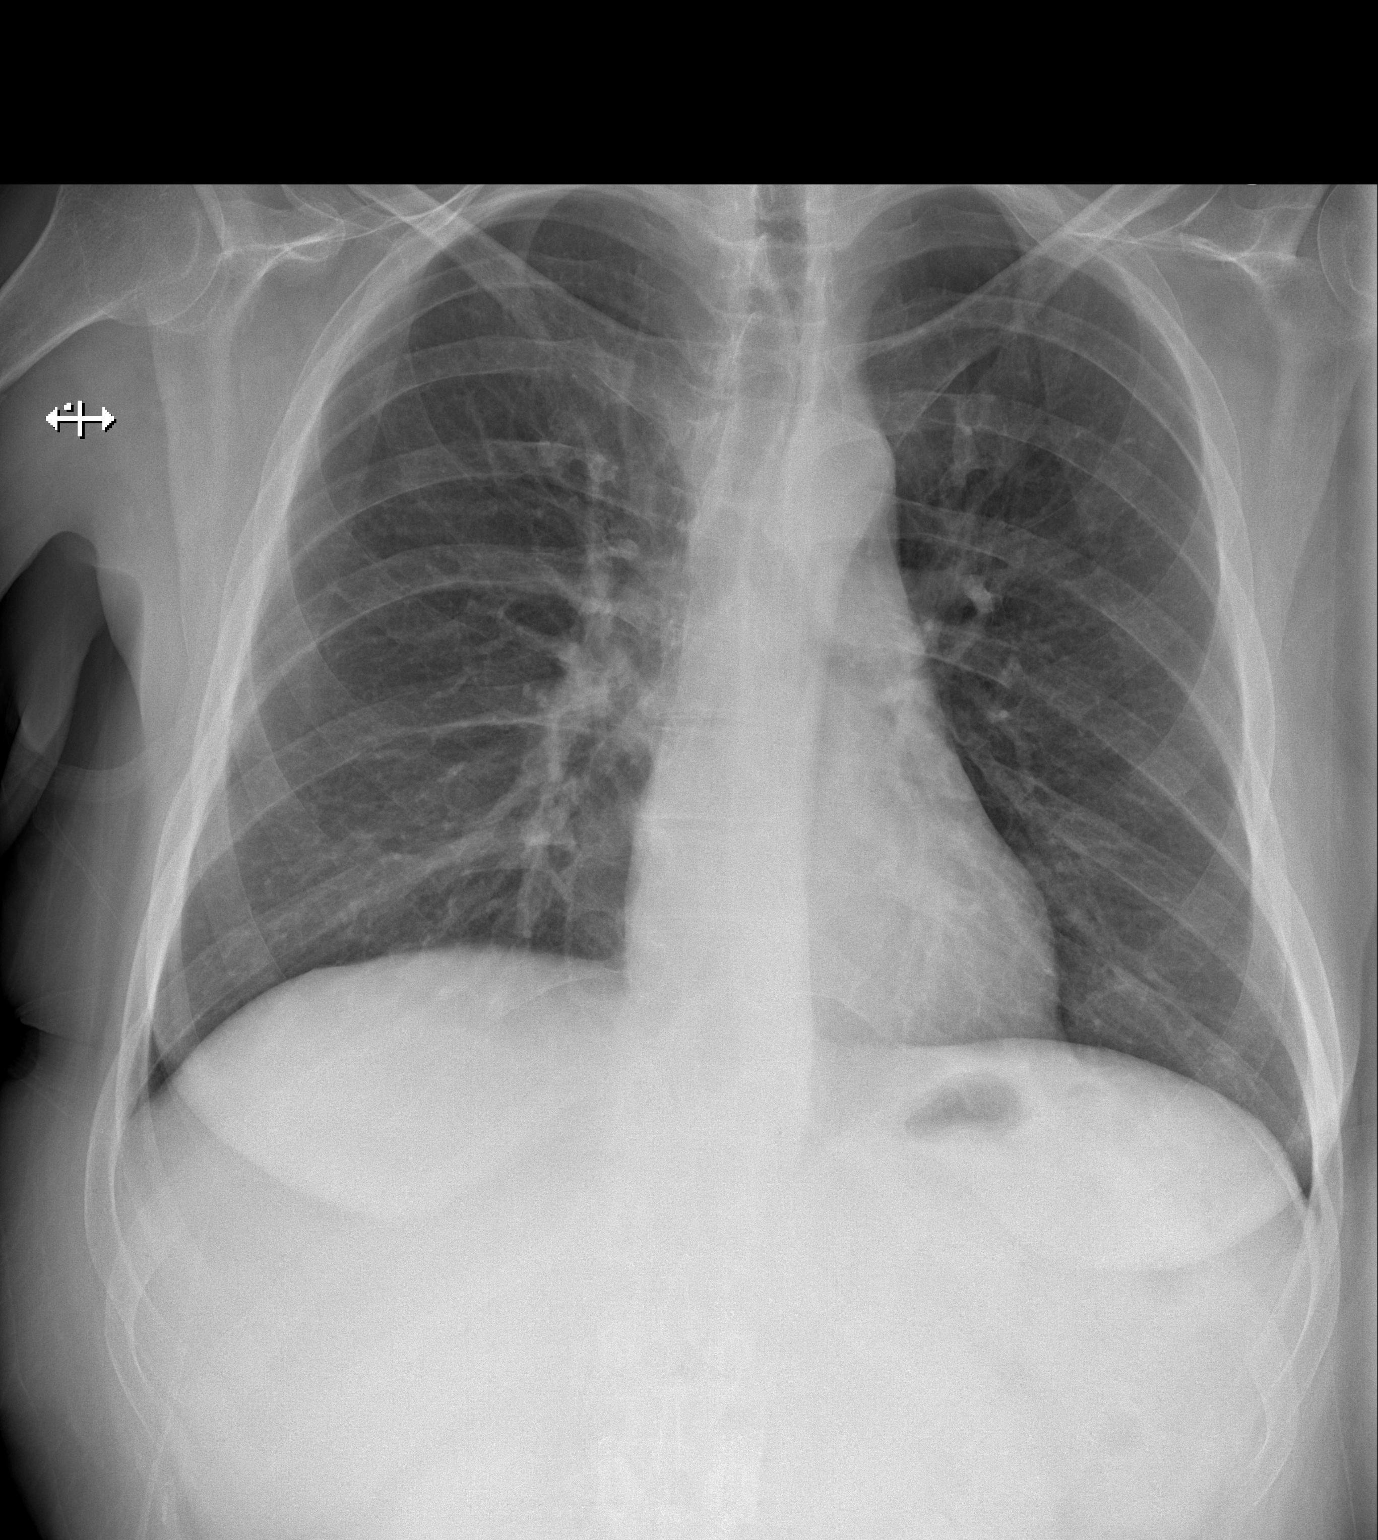

[w chest lat]
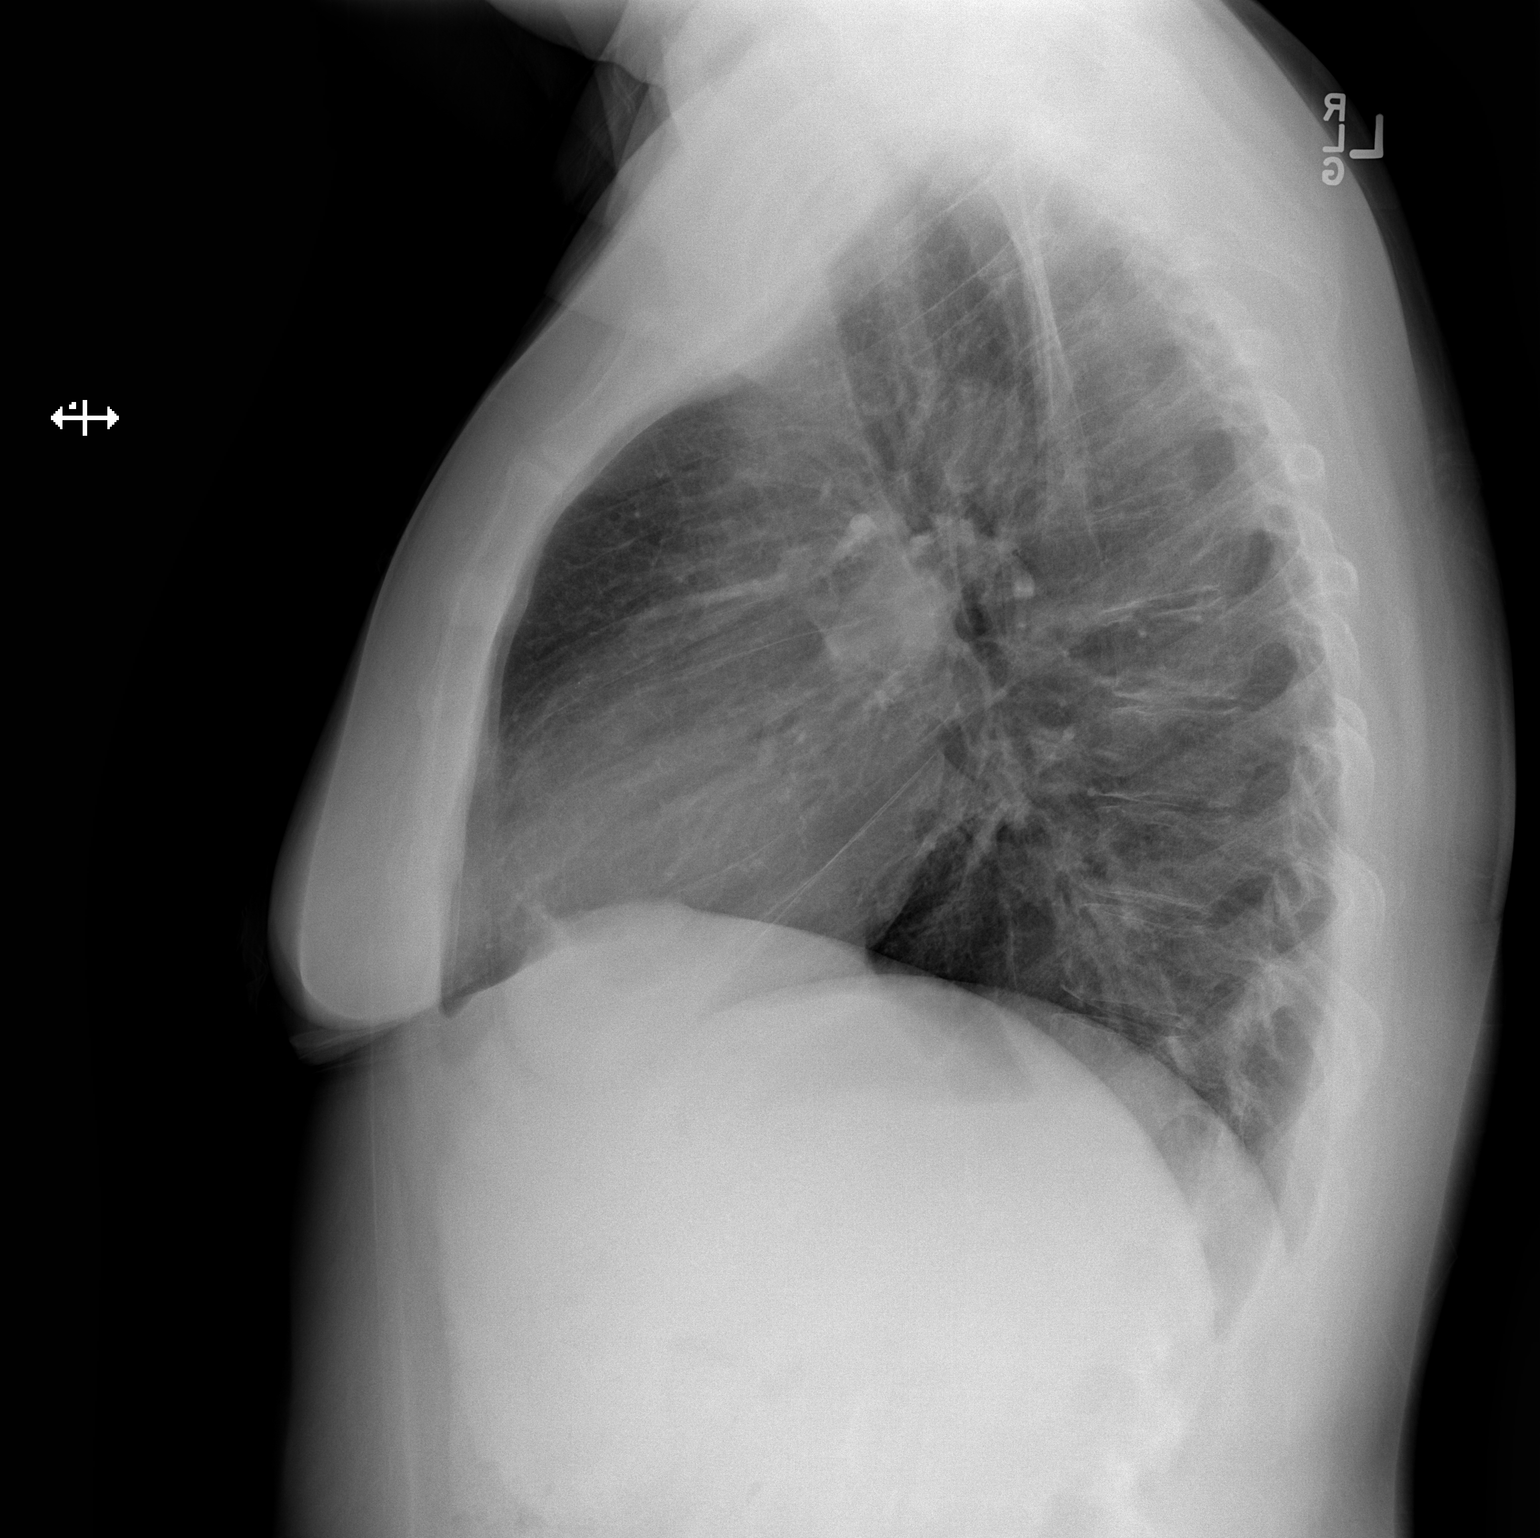

[2 of 2 positions shown; findings below may reference images not displayed]

FINDINGS: Lungs are clear. Heart size is normal. No pneumothorax or pleural
effusion. No focal bony abnormality.
IMPRESSION: No acute disease.

## 2018-07-31 IMAGING — CR DG KNEE COMPLETE 4+V*R*
4 series · 4 of 4 positions shown · non-contrast
Comparison: 06/27/2016

CLINICAL DATA: Fell 2 days ago.  Knee pain.

EXAM:
RIGHT KNEE - COMPLETE 4+ VIEW

[t knee ap right]
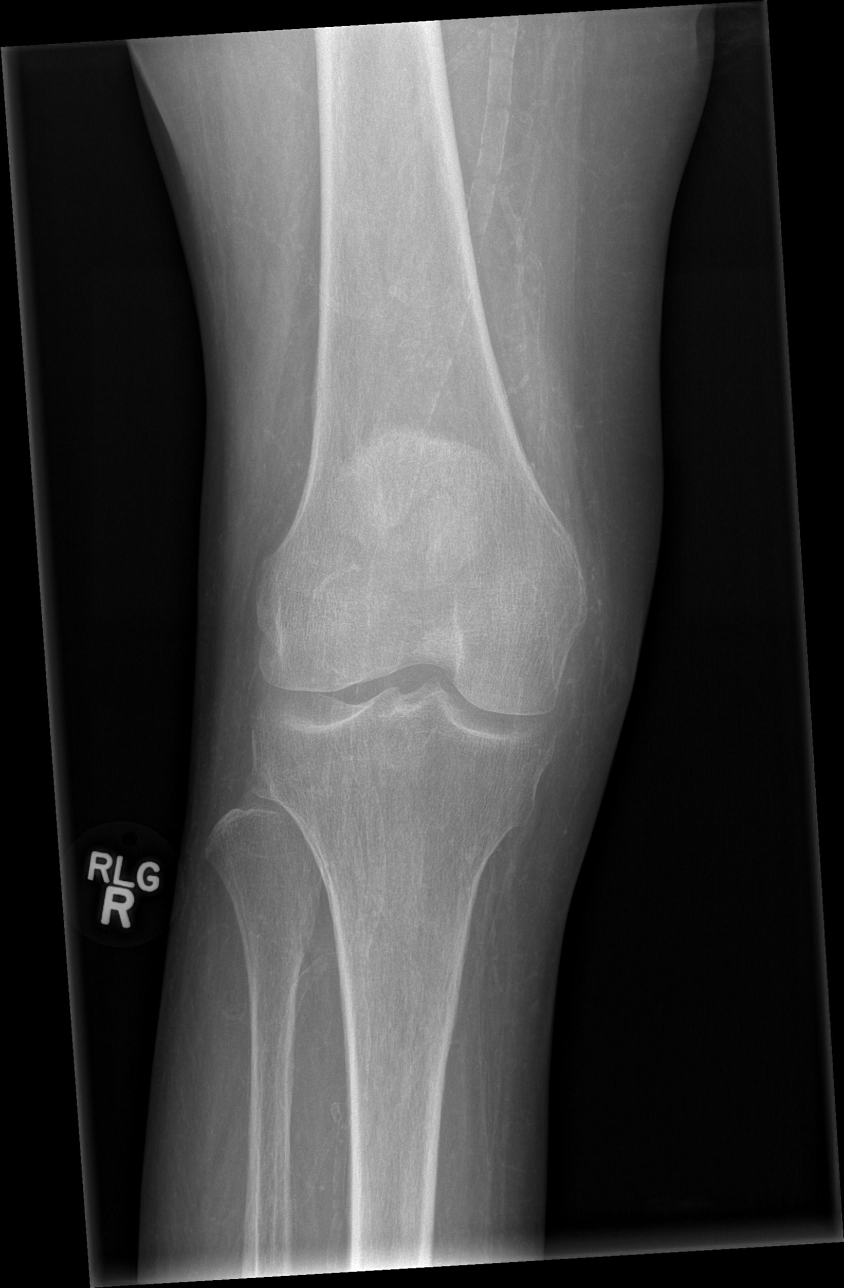

[t knee obl right (1 of 2)]
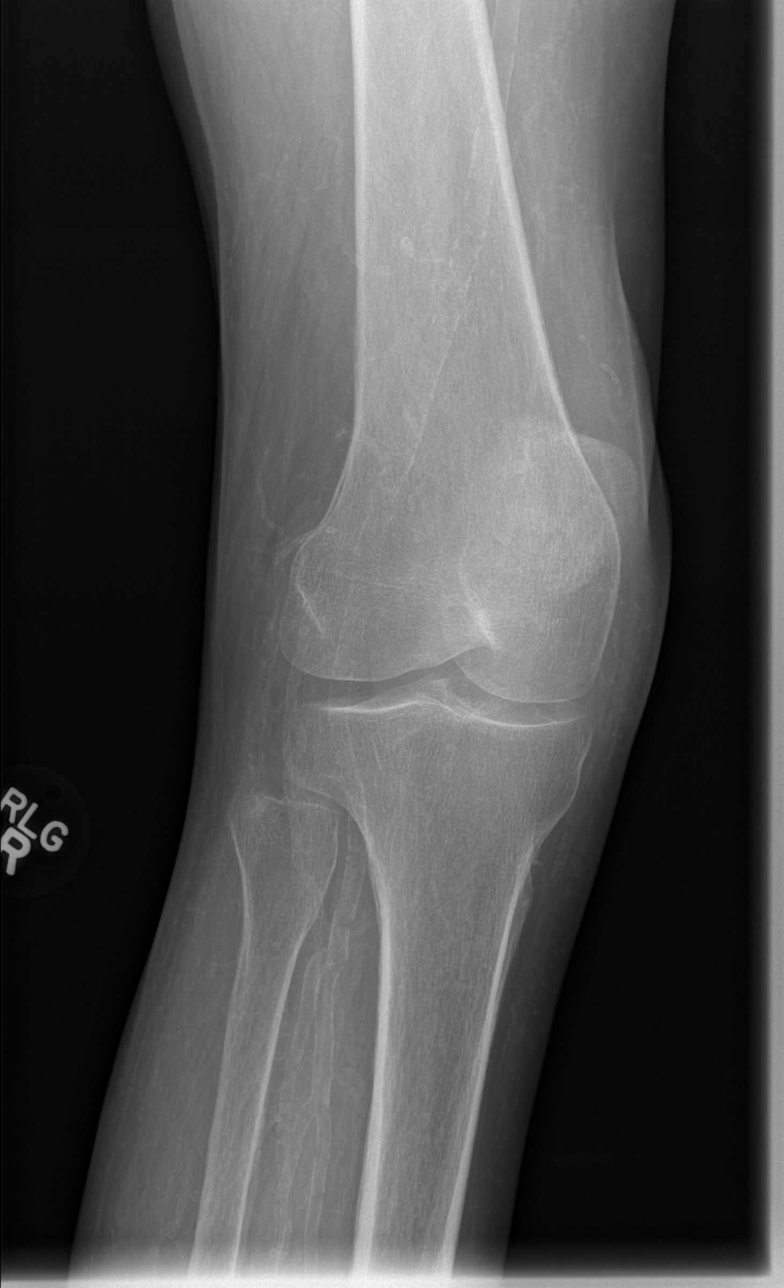

[t knee obl right (2 of 2)]
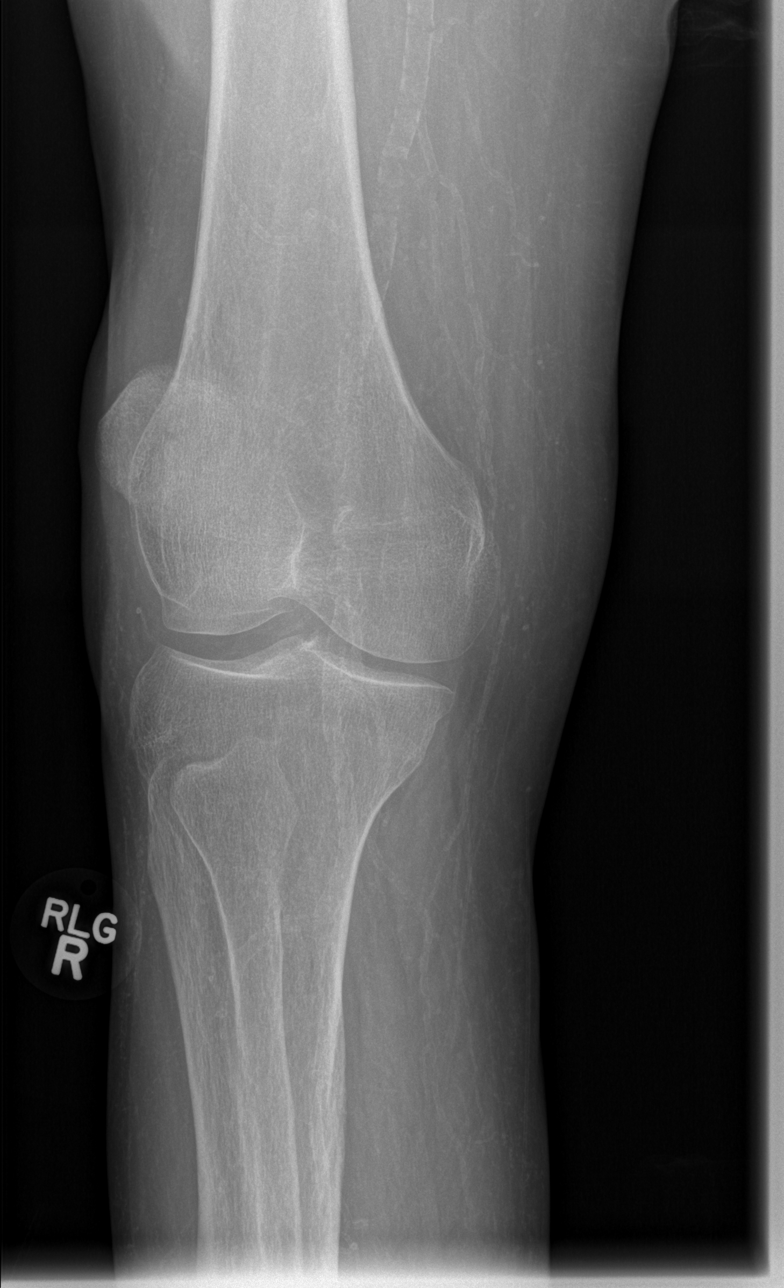

[t knee lat right]
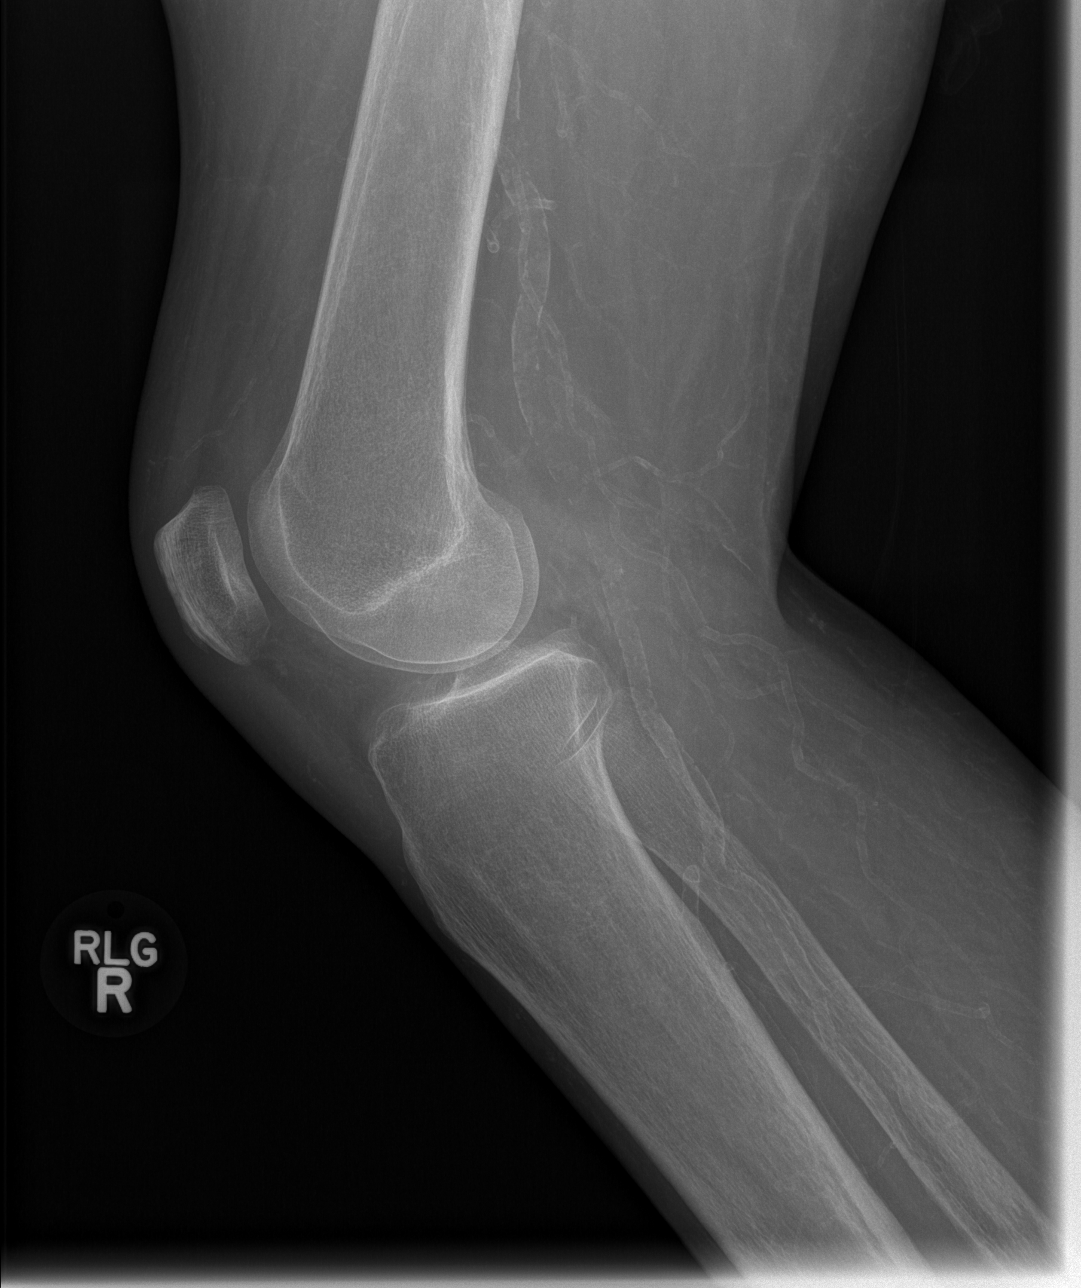

[4 of 4 positions shown; findings below may reference images not displayed]

FINDINGS: Extensive regional arterial calcification. No evidence of fracture,
dislocation, degenerative change or joint effusion.
IMPRESSION: No acute finding.  Arterial calcification.

## 2018-07-31 MED ORDER — HYDRALAZINE HCL 20 MG/ML IJ SOLN
10.0000 mg | Freq: Four times a day (QID) | INTRAMUSCULAR | Status: DC | PRN
  Filled 2018-07-31: qty 1

## 2018-07-31 MED ORDER — SODIUM CHLORIDE 0.9 % IV BOLUS
250.0000 mL | Freq: Once | INTRAVENOUS | Status: AC
  Administered 2018-07-31: 250 mL via INTRAVENOUS

## 2018-07-31 MED ORDER — CHLORHEXIDINE GLUCONATE 0.12 % MT SOLN
15.0000 mL | Freq: Two times a day (BID) | OROMUCOSAL | Status: DC
  Administered 2018-07-31 – 2018-08-02 (×5): 15 mL via OROMUCOSAL
  Filled 2018-07-31 (×5): qty 15

## 2018-07-31 MED ORDER — ORAL CARE MOUTH RINSE
15.0000 mL | Freq: Two times a day (BID) | OROMUCOSAL | Status: DC
  Administered 2018-07-31 – 2018-08-02 (×5): 15 mL via OROMUCOSAL

## 2018-07-31 NOTE — Progress Notes (Signed)
  Speech Language Pathology Treatment: Dysphagia  Patient Details Name: [name] MRN: [id number] DOB: 1966-03-29 Today's Date: 07/31/2018 Time: [id number] SLP Time Calculation (min) (ACUTE ONLY): 9 min  Assessment / Plan / Recommendation Clinical Impression  Pt is lethargic and not able to be aroused sufficiently for PO intake despite Max multimodal cues. Minimal oral response noted during oral care, although she did close approximate her lips. Recommend that she remain NPO pending further improvements in mentation.   HPI HPI: 52 y.o. 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  s/p R AKA on 07/16/2018. Pt seen by SLP during prior admissions; MBS 11/16/17 revealed functional oral stage, delayed swallow reflex with trace penetration of thin liquids. Thin liquids with chin tuck and dysphagia 2 recommended (due to oral sores). Swallow evaluation ordered as pt noted to cough and desat 15-20 minutes after taking pills in puree. CXR 07/30/18 Increased left mid and lower lung mixed interstitial and airspace opacity with a small left pleural effusion concerning for asymmetric edema or pneumonia      SLP Plan  Continue with current plan of care       Recommendations  Diet recommendations: NPO Medication Administration: Via alternative means                Oral Care Recommendations: Oral care QID Follow up Recommendations: (tba) SLP Visit Diagnosis: Dysphagia, unspecified (R13.10) Plan: Continue with current plan of care       GO                Maxcine Hamaiewonsky, Aanyah Loa 07/31/2018, 10:51 AM  Maxcine HamLaura Paiewonsky, M.A. CCC-SLP Acute Herbalistehabilitation Services Pager [phone] Office [phone]

## 2018-07-31 NOTE — Progress Notes (Signed)
Patient Demographics:    [name], is a 52 y.o. female, DOB - 08-29-1966, ZOX:[id number]  Admit date - 08/05/2018   Admitting Physician Kendell Bane, MD  Outpatient Primary MD for the patient is Dois Davenport, MD  LOS - 9   No chief complaint on file.       Subjective:    [name] today has no fevers, no emesis,  No chest pain, clinically worse with hypotension and toxic metabolic encephalopathy, hypoxia, hypotension noted, multiple attempts to reach patient's husband to discuss goals of care  Assessment  & Plan :    Active Problems:   Severe protein-calorie malnutrition (HCC)   Osteomyelitis (HCC)   Pressure injury of skin   Diabetic polyneuropathy associated with type 1 diabetes mellitus (HCC)   Dehiscence of amputation stump (HCC)  Brief Summary:- [name] is 52 yo female with h/o severe PAD, renal transplant on tac/ pred, CKD III,  H/o 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, physical disease consultant advises continuing IV daptomycin/Rocephin through 08/05/2018 (for 7 days post amputation date)  2)Toxic metabolic encephalopathy--- multifactorial, suspect secondary to AKI on CKD, as well as infection/osteomyelitis of the right BKA stump with sepsis, clinically worse with hypotension and toxic metabolic encephalopathy  3)Acute on chronic Anemia--- at baseline patient has chronic anemia of CKD, EPO agent as  per nephrologist, monitor H&H closely and transfuse as clinically indicated, hemoglobin is down to 7.8, consider transfusion if hypotension persists  4)S/p Deceased Donor Kidney TRansplant --- patient now with AKI superimposed on CKD 3 --- suspect due to dehydration/volume depletion continue tacrolimus and prednisone, nephrology input appreciated,  creatinine on admission= 3.3 ,   baseline creatinine = 2.4 to 2.6    , creatinine is now= 2.85    , renally adjust medications, avoid nephrotoxic agents/dehydration/hypotension,    5)Thrombocytopenia--- status post transfusion of Nplate on 07/26/2018 and transfusion of 1 unit of platelets on 07/26/18, goal is to keep platelets above 100 k preop,pt got transfuse 2 units of platelets overnight on 07/28/18 , Rt AKA surgery is planned for 07/19/2018, platelet count is > 110K,    6) acute hypoxic respiratory failure--- patient had episode of acute hypoxia presumed aspiration related after she was given medications with applesauce, n.p.o. for now,  speech and swallow evaluation noted, unable to make determination regarding feeding due to patient being too lethargic to participate effectively, consider broadening antibiotics to include anaerobic covergae if fevers or worsening respiratory status, overall improved after suctioning and nebulizer treatment  7)Social/Ethics--- multiple attempts to reach patient's husband, left multiple messages, we need to discuss goals of care, clinically worse with hypotension and toxic metabolic encephalopathy.... Despite revision of right BKA to AKA patient clinically looks worse at risk for further decompensation.. Will request palliative care consult to help delineate goals of care  Disposition/Need for  in-Hospital Stay- patient unable to be discharged at this time due to needing IV antibiotics and revision of a right BKA to AKA due to sepsis/wound dehiscence/infection/osteomyelitis, clinically worse with hypotension and toxic  metabolic encephalopathy  Code Status : Full  Disposition Plan  : TBD  Consults  :  Ortho/ID/Nephrology   DVT Prophylaxis  :  ( SCDs on Lt), thrombocytopenia so no heparin  Lab Results  Component Value Date   PLT 111 (L) 07/31/2018    Inpatient Medications  Scheduled Meds: . chlorhexidine  15 mL Mouth Rinse BID  . Chlorhexidine Gluconate Cloth  6 each Topical Daily  . Gerhardt's butt cream   Topical BID  . insulin aspart  0-15 Units Subcutaneous TID WC  . insulin aspart  0-5 Units Subcutaneous QHS  . levothyroxine  112 mcg Oral QAC breakfast  . mouth rinse  15 mL Mouth Rinse q12n4p  . nystatin   Topical BID  . predniSONE  5 mg Oral Q breakfast  . tacrolimus  1 mg Oral QPC breakfast   Continuous Infusions: . sodium chloride 10 mL/hr at 07/30/18 0900  . cefTRIAXone (ROCEPHIN)  IV 2 g (07/30/18 1737)  . DAPTOmycin (CUBICIN)  IV Stopped (07/30/18 2359)  . dextrose 5 % and 0.45% NaCl 75 mL/hr at 07/31/18 0700   PRN Meds:.acetaminophen **OR** acetaminophen, bisacodyl, hydrALAZINE, magnesium citrate, methocarbamol **OR** [DISCONTINUED] methocarbamol (ROBAXIN) IV, metoCLOPramide **OR** metoCLOPramide (REGLAN) injection, morphine injection, ondansetron **OR** ondansetron (ZOFRAN) IV, oxyCODONE-acetaminophen, polyethylene glycol, senna-docusate    Anti-infectives (From admission, onward)   Start     Dose/Rate Route Frequency Ordered Stop   07/26/2018 0745  ceFAZolin (ANCEF) IVPB 2g/100 mL premix  Status:  Discontinued     2 g 200 mL/hr over 30 Minutes Intravenous On call to O.R. 08/03/2018 0731 07/18/2018 1622   07/26/18 2000  DAPTOmycin (CUBICIN) 600 mg in sodium chloride 0.9 % IVPB     600 mg 224 mL/hr over 30 Minutes Intravenous Every 48 hours 07/25/18 1145     07/11/2018 2000  DAPTOmycin (CUBICIN) 300 mg in sodium chloride 0.9 % IVPB  Status:  Discontinued     300 mg 212 mL/hr over 30 Minutes Intravenous Every 48 hours 07/14/2018 1543 07/25/18 1145   07/20/2018 1800  cefTRIAXone  (ROCEPHIN) 2 g in sodium chloride 0.9 % 100 mL IVPB     2 g 200 mL/hr over 30 Minutes Intravenous Every 24 hours 07/13/2018 1543          Objective:   Vitals:   07/31/18 0533 07/31/18 0552 07/31/18 0715 07/31/18 0904  BP: (!) 87/69 92/64 (!) 83/55 (!) 86/49  Pulse: 100 95 (!) 103 93  Resp: (!) 22 17 (!) 22 (!) 21  Temp:    97.9 F (36.6 C)  TempSrc:    Axillary  SpO2: 94% 96% 98% 95%  Weight:      Height:        Wt Readings from Last 3 Encounters:  07/31/18 64.7 kg  01/11/18 76.4 kg  01/01/18 83.9 kg     Intake/Output Summary (Last 24 hours) at 07/31/2018 1213 Last data filed at 07/31/2018 3244 Gross per 24 hour  Intake 1335.16 ml  Output 800 ml  Net 535.16 ml     Physical Exam Patient is examined daily including today on 07/31/18 , exams remain the same as of yesterday except that has changed   Gen:-Sleepy, arousable, overall lethargic HEENT:- [place].AT, No sclera icterus Neck-Supple Neck,No JVD,.  Nose- [place] 2 L/min Lungs-diminished in bases,  no wheezing  CV- S1, S2 normal, regular  Abd-  +ve B.Sounds, Abd Soft, No tenderness,    Extremity/Skin:-Right  AKA stump with wound VAC in situ, left foot with missing big toe Psych-lethargic, disoriented Neuro-generalized weakness without  new focal deficits, no tremors   Data Review:   Micro Results Recent Results (from the past 240 hour(s))  MRSA PCR Screening     Status: None   Collection Time: 07/08/2018  2:28 PM  Result Value Ref Range Status   MRSA by PCR NEGATIVE NEGATIVE Final    Comment:        The GeneXpert MRSA Assay (FDA approved for NASAL specimens only), is one component of a comprehensive MRSA colonization surveillance program. It is not intended to diagnose MRSA infection nor to guide or monitor treatment for MRSA infections. Performed at [facility], [address] [place], [place] [address]   Surgical pcr screen     Status: None   Collection Time: 08/24/2018  6:19 AM  Result Value Ref  Range Status   MRSA, PCR NEGATIVE NEGATIVE Final   Staphylococcus aureus NEGATIVE NEGATIVE Final    Comment: (NOTE) The Xpert SA Assay (FDA approved for NASAL specimens in patients 51 years of age and older), is one component of a comprehensive surveillance program. It is not intended to diagnose infection nor to guide or monitor treatment. Performed at [facility], [address] [place], [place] [address]     Radiology Reports Ct Head Wo Contrast  Result Date: 07/25/2018 CLINICAL DATA:  52 year old female with altered level of consciousness. EXAM: CT HEAD WITHOUT CONTRAST TECHNIQUE: Contiguous axial images were obtained from the base of the skull through the vertex without intravenous contrast. COMPARISON:  07/19/2018 FINDINGS: Motif shin artifact decreases sensitivity despite rescanning. Brain: No evidence of acute infarction, hemorrhage, hydrocephalus, extra-axial collection or mass lesion/mass effect. Mild atrophy and chronic small-vessel white matter ischemic changes again noted. Vascular: Vertebral and carotid atherosclerotic calcifications noted. Skull: No acute abnormality. Sinuses/Orbits: No acute abnormality. Mucoperiosteal thickening of the paranasal sinuses again noted. Other: None IMPRESSION: 1. No evidence of acute intracranial abnormality 2. Mild atrophy and chronic small-vessel white matter ischemic changes 3. Chronic paranasal sinus disease. Electronically Signed   By: Harmon PierJeffrey  Hu M.D.   On: 07/25/2018 12:01   Dg Chest Port 1 View  Result Date: 07/30/2018 CLINICAL DATA:  Hypoxia EXAM: PORTABLE CHEST 1 VIEW COMPARISON:  07/20/2018 FINDINGS: Exam is rotated to the left. Increased left mid and lower lung mixed interstitial and airspace opacities concerning for developing left lung pneumonia or asymmetric edema. Right lung remains clear. Small left effusion not excluded. No large pneumothorax. Trachea is midline. Aorta atherosclerotic. Bones are osteopenic. IMPRESSION:  Increased left mid and lower lung mixed interstitial and airspace opacity with a small left pleural effusion concerning for asymmetric edema or pneumonia. Atherosclerosis Electronically Signed   By: Judie PetitM.  Shick M.D.   On: 07/30/2018 13:27   [place] Ekg Site Rite  Result Date: 07/28/2018 If Site Rite image not attached, placement could not be confirmed due to current cardiac rhythm.    CBC Recent Labs  Lab 07/25/18 0448 07/26/18 0424 08/24/2018 0620 07/28/18 0457 08/03/2018 1133 07/31/18 0536  WBC 5.8 5.6 6.9 6.3 5.5 10.5  HGB 8.2* 9.9* 10.9* 9.4* 7.7* 7.8*  HCT 27.6* 32.9* 35.5* 30.6* 25.2* 27.4*  PLT 45* 56* 77* 70* 148* 111*  MCV 90.8 89.6 88.3 87.4 90.3 92.3  MCH 27.0 27.0 27.1 26.9 27.6 26.3  MCHC 29.7* 30.1 30.7 30.7 30.6 28.5*  RDW 19.5* 18.7* 18.3* 18.2* 18.6* 19.1*  LYMPHSABS 0.7 0.5* 1.1 1.1 1.0  --   MONOABS 0.3 0.2 0.4 0.4 0.5  --   EOSABS 0.0 0.0 0.2 0.1 0.1  --   BASOSABS 0.0 0.0 0.0 0.0 0.0  --     Chemistries  Recent Labs  Lab 07/25/18 0448 07/26/18 0424 07/21/2018 0620 07/28/18 0457 08/06/2018 1133 07/31/18 0536  NA 142 140 139 136 140 140  K 3.0* 4.2 3.5 4.1 3.6 3.9  CL 108 109 108 107 110 110  CO2 29 23 23  21* 22 17*  GLUCOSE 89 161* 76 145* 107* 185*  BUN 26* 23* 22* 21* 23* 23*  CREATININE 2.84* 2.72* 2.47* 2.50* 2.75* 2.85*  CALCIUM 7.6* 8.0* 8.1* 7.7* 8.1* 8.1*  MG 1.7  --   --   --   --   --    ------------------------------------------------------------------------------------------------------------------ No results for input(s): CHOL, HDL, LDLCALC, TRIG, CHOLHDL, LDLDIRECT in the last 72 hours.  Lab Results  Component Value Date   HGBA1C 6.2 (H) 07/26/2018   ------------------------------------------------------------------------------------------------------------------ No results for input(s): TSH, T4TOTAL, T3FREE, THYROIDAB in the last 72 hours.  Invalid input(s):  FREET3 ------------------------------------------------------------------------------------------------------------------ No results for input(s): VITAMINB12, FOLATE, FERRITIN, TIBC, IRON, RETICCTPCT in the last 72 hours.  Coagulation profile Recent Labs  Lab 07/25/18 0448  INR 1.28    No results for input(s): DDIMER in the last 72 hours.  Cardiac Enzymes No results for input(s): CKMB, TROPONINI, MYOGLOBIN in the last 168 hours.  Invalid input(s): CK ------------------------------------------------------------------------------------------------------------------    Component Value Date/Time   BNP 958.8 (H) 11/14/2017 1313    Shon Hale M.D on 07/31/2018 at 12:13 PM  Pager---[phone] Go to [email or web address] - password [email or web address] for contact info  [facility] - Office  [phone]

## 2018-07-31 NOTE — Progress Notes (Signed)
PHARMACY CONSULT NOTE FOR:  OUTPATIENT  PARENTERAL ANTIBIOTIC THERAPY (OPAT)  Indication:  Osteomyelitis  Regimen: Daptomycin 600 mg iv Q 48 hours  (TBW ~9 mg/kg q48h) and ceftriaxone 2g q24hr End date:  08/05/18  (7 days post surgery, R AKA done 07/31/2018)  IV antibiotic discharge orders are pended. To discharging provider:  please sign these orders via discharge navigator,  Select New Orders & click on the button choice - Manage This Unsigned Work.     Thank you for allowing pharmacy to be a part of this patient's care. [name], RPh Clinical Pharmacist [phone] 07/31/2018, 11:20 AM

## 2018-07-31 NOTE — Anesthesia Postprocedure Evaluation (Signed)
Anesthesia Post Note  Patient: [name]  Procedure(s) Performed: AMPUTATION ABOVE KNEE (Right )     Patient location during evaluation: PACU Anesthesia Type: General Level of consciousness: awake and alert Pain management: pain level controlled Vital Signs Assessment: post-procedure vital signs reviewed and stable Respiratory status: spontaneous breathing, nonlabored ventilation, respiratory function stable and patient connected to nasal cannula oxygen Cardiovascular status: blood pressure returned to baseline and stable Postop Assessment: no apparent nausea or vomiting Anesthetic complications: no    Last Vitals:  Vitals:   07/31/18 0715 07/31/18 0904  BP: (!) 83/55 (!) 86/49  Pulse: (!) 103 93  Resp: (!) 22 (!) 21  Temp:  36.6 C  SpO2: 98% 95%    Last Pain:  Vitals:   07/31/18 0904  TempSrc: Axillary  PainSc:                  Chelsey L Woodrum

## 2018-07-31 NOTE — Progress Notes (Signed)
Patient ID: [name], female   DOB: 1966-01-21, 52 y.o.   MRN: [id number] [facility] Progress Note   Assessment/ Plan:   1.  Acute kidney injury on chronic kidney disease stage III T: Likely from volume depletion/ATN associated with possible sepsis.  Rise of creatinine noted with fair urine output and without any acute electrolyte abnormalities.  Awaiting recheck of Prograf levels. 2.  Status post deceased donor kidney transplant: Continue ongoing chronic immunosuppressive therapy with Prograf 1 mg daily and prednisone 5 mg daily- elevated trough level noted on 11/17.  Recheck Prograf level tomorrow. 3.  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  She underwent AKA yesterday by Dr. Lajoyce Corners and has a wound VAC in situ. 4.  Encephalopathy-likely metabolic: Suspect that this is largely in part from underlying osteomyelitis/sepsis-anticipated to improve with source control. 5.  Thrombocytopenia: Suspected to be associated with chronic inflammation/medications-no evidence of TTP/DIC.  Appreciate input from hematology.  6.  Anemia: Without overt blood loss, suspect anemia from underlying chronic kidney disease as well as critical illness/malnutrition-inflammatory complex with ongoing osteomyelitis.  Continue to monitor for PRBC needs.  Subjective:   Overnight events noted-significant for aspiration event yesterday.   Objective:   BP (!) 86/49 (BP Location: Right Arm)   Pulse 93   Temp 97.9 F (36.6 C) (Axillary)   Resp (!) 21   Ht 5\' 8"  (1.727 m)   Wt 64.7 kg   LMP 11/19/2011 Comment: pt had tubal ligation  SpO2 95%   BMI 21.69 kg/m   Intake/Output Summary (Last 24 hours) at 07/31/2018 1035 Last data filed at 07/31/2018 0921 Gross per 24 hour  Intake 1335.16 ml  Output 800 ml  Net 535.16 ml   Weight change: 0 kg  Physical Exam: Gen: Sleeping soundly in bed-difficult to arouse CVS: Pulse regular  rhythm, normal rate, S1 and S2 normal Resp: Coarse/transmitted breath sounds bilaterally, no distinct rales or rhonchi Abd: Soft, obese, nontender Ext: Right AKA stump with wound VAC in situ.  Left leg with significant ecchymosis/bruising, no edema.  Imaging: Dg Chest Port 1 View  Result Date: 07/30/2018 CLINICAL DATA:  Hypoxia EXAM: PORTABLE CHEST 1 VIEW COMPARISON:  07/20/2018 FINDINGS: Exam is rotated to the left. Increased left mid and lower lung mixed interstitial and airspace opacities concerning for developing left lung pneumonia or asymmetric edema. Right lung remains clear. Small left effusion not excluded. No large pneumothorax. Trachea is midline. Aorta atherosclerotic. Bones are osteopenic. IMPRESSION: Increased left mid and lower lung mixed interstitial and airspace opacity with a small left pleural effusion concerning for asymmetric edema or pneumonia. Atherosclerosis Electronically Signed   By: Judie Petit.  Shick M.D.   On: 07/30/2018 13:27    Labs: BMET Recent Labs  Lab 07/25/18 0448 07/26/18 0424 07/14/2018 0620 07/28/18 0457 08-20-18 1133 07/31/18 0536  NA 142 140 139 136 140 140  K 3.0* 4.2 3.5 4.1 3.6 3.9  CL 108 109 108 107 110 110  CO2 29 23 23  21* 22 17*  GLUCOSE 89 161* 76 145* 107* 185*  BUN 26* 23* 22* 21* 23* 23*  CREATININE 2.84* 2.72* 2.47* 2.50* 2.75* 2.85*  CALCIUM 7.6* 8.0* 8.1* 7.7* 8.1* 8.1*  PHOS 2.9 2.9 2.7 2.6 3.0 3.6   CBC Recent Labs  Lab 07/26/18 0424 07/12/2018 0620 07/28/18 0457 20-Aug-2018 1133 07/31/18 0536  WBC 5.6 6.9 6.3 5.5 10.5  NEUTROABS 4.9 5.2 4.7 4.0  --   HGB 9.9* 10.9* 9.4*  7.7* 7.8*  HCT 32.9* 35.5* 30.6* 25.2* 27.4*  MCV 89.6 88.3 87.4 90.3 92.3  PLT 56* 77* 70* 148* 111*    Medications:    . chlorhexidine  15 mL Mouth Rinse BID  . Chlorhexidine Gluconate Cloth  6 each Topical Daily  . docusate sodium  100 mg Oral BID  . Gerhardt's butt cream   Topical BID  . insulin aspart  0-15 Units Subcutaneous TID WC  . insulin  aspart  0-5 Units Subcutaneous QHS  . levothyroxine  112 mcg Oral QAC breakfast  . mouth rinse  15 mL Mouth Rinse q12n4p  . nystatin   Topical BID  . predniSONE  5 mg Oral Q breakfast  . tacrolimus  1 mg Oral QPC breakfast   Zetta BillsJay Brissia Delisa, MD 07/31/2018, 10:35 AM

## 2018-08-01 ENCOUNTER — Encounter (HOSPITAL_COMMUNITY): Payer: Self-pay | Admitting: Orthopedic Surgery

## 2018-08-01 ENCOUNTER — Inpatient Hospital Stay (HOSPITAL_COMMUNITY): Payer: Medicare Other

## 2018-08-01 LAB — RENAL FUNCTION PANEL
Albumin: 1.3 g/dL — ABNORMAL LOW (ref 3.5–5.0)
Anion gap: 9 (ref 5–15)
BUN: 24 mg/dL — AB (ref 6–20)
CALCIUM: 8.3 mg/dL — AB (ref 8.9–10.3)
CHLORIDE: 111 mmol/L (ref 98–111)
CO2: 21 mmol/L — AB (ref 22–32)
Creatinine, Ser: 2.96 mg/dL — ABNORMAL HIGH (ref 0.44–1.00)
GFR calc Af Amer: 20 mL/min — ABNORMAL LOW (ref >60)
GFR calc non Af Amer: 17 mL/min — ABNORMAL LOW (ref >60)
GLUCOSE: 174 mg/dL — AB (ref 70–99)
PHOSPHORUS: 3.3 mg/dL (ref 2.5–4.6)
Potassium: 3.6 mmol/L (ref 3.5–5.1)
Sodium: 141 mmol/L (ref 135–145)

## 2018-08-01 LAB — GLUCOSE, CAPILLARY
GLUCOSE-CAPILLARY: 126 mg/dL — AB (ref 70–99)
GLUCOSE-CAPILLARY: 79 mg/dL (ref 70–99)
GLUCOSE-CAPILLARY: 89 mg/dL (ref 70–99)
Glucose-Capillary: 165 mg/dL — ABNORMAL HIGH (ref 70–99)
Glucose-Capillary: 78 mg/dL (ref 70–99)

## 2018-08-01 LAB — AMMONIA: Ammonia: 37 umol/L — ABNORMAL HIGH (ref 9–35)

## 2018-08-01 NOTE — Progress Notes (Signed)
Palliative Medicine No charge note:  Thank you for this consult. GOC meeting arranged with spouse- Cletis MediaMichael Katayama for 3:30 pm tomorrow 11/26.  Ocie BobKasie Mahan, AGNP-C Palliative Medicine  Please call Palliative Medicine team phone with any questions [phone]. For individual providers please see AMION.

## 2018-08-01 NOTE — Progress Notes (Addendum)
Inpatient Diabetes Program Recommendations  AACE/ADA: New Consensus Statement on Inpatient Glycemic Control (2015)  Target Ranges:  Prepandial:   less than 140 mg/dL      Peak postprandial:   less than 180 mg/dL (1-2 hours)      Critically ill patients:  140 - 180 mg/dL   Lab Results  Component Value Date   GLUCAP 165 (H) 08/01/2018   HGBA1C 6.2 (H) 07/26/2018    Review of Glycemic Control Results for [name], [name] (MRN [id number]) as of 08/01/2018 11:56  Ref. Range 07/31/2018 21:17 08/01/2018 00:02 08/01/2018 08:43  Glucose-Capillary Latest Ref Range: 70 - 99 mg/dL 67 (L) 89 409165 (H)    Inpatient Diabetes Program Recommendations:     Noted that blood sugar last night was 67 mg/dl. Recommend decreasing Novolog correction scale to SENSITIVE (0-9 units) Q4H if blood sugars continue to be less than 100 mg/dl.   Thanks, Lujean RaveLauren Trevonn Hallum, MSN, RNC-OB Diabetes Coordinator [phone] (8a-5p)

## 2018-08-01 NOTE — Progress Notes (Signed)
Patient Demographics:    [name], is a 52 y.o. female, DOB - July 17, 1966, VWU:[id number]  Admit date - 07/21/2018   Admitting Physician Kendell Bane, MD  Outpatient Primary MD for the patient is Dois Davenport, MD  LOS - 10   No chief complaint on file.       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 s/p right AKA  08-18-18, infectious disease consultant advises continuing IV daptomycin/Rocephin through 08/05/2018 (for 7 days post amputation date)  2)Toxic metabolic encephalopathy--- multifactorial, suspect secondary to AKI on CKD, as well as infection/osteomyelitis of the right BKA stump with sepsis, persistent toxic metabolic encephalopathy.... Despite revision of right BKA to AKA patient clinically looks worse at risk for further decompensation.[name] Awaiting palliative care consult to help delineate goals of care Serum ammonia is 37, repeat CT head pending,  3)Acute on chronic Anemia--- at  baseline patient has chronic anemia of CKD, EPO agent as per nephrologist, monitor H&H closely and transfuse as clinically indicated, hemoglobin is down to 7.8,    4)S/p Deceased Donor Kidney TRansplant --- patient now with AKI superimposed on CKD 3 --- suspect due to dehydration/volume depletion continue tacrolimus and prednisone, nephrology input appreciated,  creatinine on admission= 3.3 ,   baseline creatinine = 2.4 to 2.6    , creatinine is now= 2.96    , renally adjust medications, avoid nephrotoxic agents/dehydration/hypotension,    5)Thrombocytopenia--- status post transfusion of Nplate on 07/26/2018 and transfusion of 1 unit of platelets on 07/26/18, goal is to keep platelets above 100 k preop,pt got transfuse 2 units of platelets overnight on 07/28/18 , Rt AKA surgery is planned for 08/18/2018, platelet count is > 110K,    6) acute hypoxic respiratory failure--- patient had episode of acute hypoxia presumed aspiration related after she was given medications with applesauce, hypoxia has resolved, speech and swallow evaluation noted, unable to make determination regarding feeding due to patient being too lethargic to participate effectively, consider broadening antibiotics to include anaerobic covergae if fevers or worsening respiratory status, overall improved after suctioning and nebulizer treatment, continue n.p.o. status  7)Social/Ethics--- Discussed with  patient's husband, he will meet with palliative care services on 08/02/2018 to discuss goals of care, persistent toxic metabolic encephalopathy.... Despite revision of right BKA to AKA patient clinically looks worse at risk for further decompensation.[name] Awaiting palliative care  consult to help delineate goals of care  Disposition/Need for in-Hospital Stay- patient unable to be discharged at this time due to needing IV antibiotics for sepsis/wound dehiscence/infection/osteomyelitis, clinically worse with toxic metabolic encephalopathy  Code  Status : Full  Disposition Plan  : TBD  Consults  :  Ortho/ID/Nephrology  DVT Prophylaxis  :  ( SCDs on Lt), thrombocytopenia so no heparin  Lab Results  Component Value Date   PLT 111 (L) 07/31/2018    Inpatient Medications  Scheduled Meds: . chlorhexidine  15 mL Mouth Rinse BID  . Chlorhexidine Gluconate Cloth  6 each Topical Daily  . Gerhardt's butt cream   Topical BID  . insulin aspart  0-15 Units Subcutaneous TID WC  . insulin aspart  0-5 Units Subcutaneous QHS  . levothyroxine  112 mcg Oral QAC breakfast  . mouth rinse  15 mL Mouth Rinse q12n4p  . nystatin   Topical BID  . predniSONE  5 mg Oral Q breakfast  . tacrolimus  1 mg Oral QPC breakfast   Continuous Infusions: . sodium chloride 1,000 mL (08/01/18 0855)  . cefTRIAXone (ROCEPHIN)  IV 2 g (07/31/18 1742)  . DAPTOmycin (CUBICIN)  IV Stopped (07/30/18 2359)  . dextrose 5 % and 0.45% NaCl 1,000 mL (08/01/18 1004)   PRN Meds:.acetaminophen **OR** acetaminophen, bisacodyl, hydrALAZINE, magnesium citrate, methocarbamol **OR** [DISCONTINUED] methocarbamol (ROBAXIN) IV, metoCLOPramide **OR** metoCLOPramide (REGLAN) injection, morphine injection, ondansetron **OR** ondansetron (ZOFRAN) IV, oxyCODONE-acetaminophen, polyethylene glycol, senna-docusate   Anti-infectives (From admission, onward)   Start     Dose/Rate Route Frequency Ordered Stop   07/13/2018 0745  ceFAZolin (ANCEF) IVPB 2g/100 mL premix  Status:  Discontinued     2 g 200 mL/hr over 30 Minutes Intravenous On call to O.R. 07/14/2018 0731 07/16/2018 1622   07/26/18 2000  DAPTOmycin (CUBICIN) 600 mg in sodium chloride 0.9 % IVPB     600 mg 224 mL/hr over 30 Minutes Intravenous Every 48 hours 07/25/18 1145 08/05/18 2359   08-06-18 2000  DAPTOmycin (CUBICIN) 300 mg in sodium chloride 0.9 % IVPB  Status:  Discontinued     300 mg 212 mL/hr over 30 Minutes Intravenous Every 48 hours 08-06-2018 1543 07/25/18 1145   August 06, 2018 1800  cefTRIAXone (ROCEPHIN) 2 g in sodium  chloride 0.9 % 100 mL IVPB     2 g 200 mL/hr over 30 Minutes Intravenous Every 24 hours 2018-08-06 1543 08/05/18 2359        Objective:   Vitals:   08/01/18 0829 08/01/18 1222 08/01/18 1241 08/01/18 1611  BP: 124/70 (!) 161/93 (!) 157/88 (!) 158/89  Pulse: (!) 110 (!) 107 97 96  Resp: (!) 23 15 14 13   Temp: 98.5 F (36.9 C) 98.5 F (36.9 C)  98.5 F (36.9 C)  TempSrc: Axillary Axillary  Axillary  SpO2: 100% 94% 100% 100%  Weight:      Height:        Wt Readings from Last 3 Encounters:  08/01/18 66 kg  01/11/18 76.4 kg  01/01/18 83.9 kg     Intake/Output Summary (Last 24 hours) at 08/01/2018 1710 Last data filed at 08/01/2018 1004 Gross per 24 hour  Intake 936.9 ml  Output 400 ml  Net 536.9 ml   Physical Exam Patient is examined daily including [name] on 08/01/18 , exams remain the same as of yesterday except that has changed   Gen:-Sleepy, arousable, remains lethargic HEENT:- [place].AT, No sclera icterus Neck-Supple Neck,No JVD,.  Nose- Odem 2 L/min Lungs-diminished in bases, no wheezing  CV- S1, S2 normal, regular  Abd-  +ve B.Sounds, Abd Soft, No tenderness,    Extremity/Skin:-Right  AKA stump with wound VAC in situ, left foot with missing big toe Psych-lethargic, disoriented Neuro-generalized weakness without  new focal deficits, no tremors   Data Review:   Micro Results Recent Results (from the past 240 hour(s))  Surgical pcr screen     Status: None   Collection Time: 08/17/2018  6:19 AM  Result Value Ref Range Status   MRSA, PCR NEGATIVE NEGATIVE Final   Staphylococcus aureus NEGATIVE NEGATIVE Final    Comment: (NOTE) The Xpert SA Assay (FDA approved for NASAL specimens in patients 64 years of age and older), is one component of a comprehensive surveillance program. It is not intended to diagnose infection nor to guide or monitor treatment. Performed at [facility], [address], [place], [place] [address]     Radiology Reports Ct Head Wo  Contrast  Result Date: 07/25/2018 CLINICAL DATA:  52 year old female with altered level of consciousness. EXAM: CT HEAD WITHOUT CONTRAST TECHNIQUE: Contiguous axial images were obtained from the base of the skull through the vertex without intravenous contrast. COMPARISON:  07/19/2018 FINDINGS: Motif shin artifact decreases sensitivity despite rescanning. Brain: No evidence of acute infarction, hemorrhage, hydrocephalus, extra-axial collection or mass lesion/mass effect. Mild atrophy and chronic small-vessel white matter ischemic changes again noted. Vascular: Vertebral and carotid atherosclerotic calcifications noted. Skull: No acute abnormality. Sinuses/Orbits: No acute abnormality. Mucoperiosteal thickening of the paranasal sinuses again noted. Other: None IMPRESSION: 1. No evidence of acute intracranial abnormality 2. Mild atrophy and chronic small-vessel white matter ischemic changes 3. Chronic paranasal sinus disease. Electronically Signed   By: Harmon Pier M.D.   On: 07/25/2018 12:01   Dg Chest Port 1 View  Result Date: 07/30/2018 CLINICAL DATA:  Hypoxia EXAM: PORTABLE CHEST 1 VIEW COMPARISON:  07/20/2018 FINDINGS: Exam is rotated to the left. Increased left mid and lower lung mixed interstitial and airspace opacities concerning for developing left lung pneumonia or asymmetric edema. Right lung remains clear. Small left effusion not excluded. No large pneumothorax. Trachea is midline. Aorta atherosclerotic. Bones are osteopenic. IMPRESSION: Increased left mid and lower lung mixed interstitial and airspace opacity with a small left pleural effusion concerning for asymmetric edema or pneumonia. Atherosclerosis Electronically Signed   By: Judie Petit.  Shick M.D.   On: 07/30/2018 13:27   [place] Ekg Site Rite  Result Date: 07/28/2018 If Site Rite image not attached, placement could not be confirmed due to current cardiac rhythm.    CBC Recent Labs  Lab 07/26/18 0424 Aug 17, 2018 0620 07/28/18 0457  07/30/2018 1133 07/31/18 0536  WBC 5.6 6.9 6.3 5.5 10.5  HGB 9.9* 10.9* 9.4* 7.7* 7.8*  HCT 32.9* 35.5* 30.6* 25.2* 27.4*  PLT 56* 77* 70* 148* 111*  MCV 89.6 88.3 87.4 90.3 92.3  MCH 27.0 27.1 26.9 27.6 26.3  MCHC 30.1 30.7 30.7 30.6 28.5*  RDW 18.7* 18.3* 18.2* 18.6* 19.1*  LYMPHSABS 0.5* 1.1 1.1 1.0  --   MONOABS 0.2 0.4 0.4 0.5  --   EOSABS 0.0 0.2 0.1 0.1  --   BASOSABS 0.0 0.0 0.0 0.0  --     Chemistries  Recent Labs  Lab 17-Aug-2018 0620 07/28/18 0457 07/28/2018 1133 07/31/18 0536 08/01/18 0748  NA 139 136 140 140 141  K 3.5 4.1 3.6 3.9 3.6  CL 108 107 110 110 111  CO2 23 21* 22 17* 21*  GLUCOSE 76 145* 107* 185* 174*  BUN  22* 21* 23* 23* 24*  CREATININE 2.47* 2.50* 2.75* 2.85* 2.96*  CALCIUM 8.1* 7.7* 8.1* 8.1* 8.3*    Lab Results  Component Value Date   HGBA1C 6.2 (H) 07/26/2018      Component Value Date/Time   BNP 958.8 (H) 11/14/2017 1313    Lalita Ebel M.D on 08/01/2018 at 5:10 PM  Pager---[phone] Go to [email or web address] - password [email or web address] for contact info  [facility] - Office  [phone]

## 2018-08-01 NOTE — Progress Notes (Signed)
Admit: 07/29/2018 LOS: 10  41F s/p KT 12/2009 Tac/Pred s/p R BKA to AKA 11/22, AMS, admitted wit hDKA  Subjective:  . Not interactive, agitated  . UOP 0.2L yest . SCr stable, K and HCO3 ok  11/24 0701 - 11/25 0700 In: 936.9 [I.V.:936.9] Out: 200 [Urine:200]  Filed Weights   07/30/18 0500 07/31/18 0500 08/01/18 0500  Weight: 64.7 kg 64.7 kg 66 kg    Scheduled Meds: . chlorhexidine  15 mL Mouth Rinse BID  . Chlorhexidine Gluconate Cloth  6 each Topical Daily  . Gerhardt's butt cream   Topical BID  . insulin aspart  0-15 Units Subcutaneous TID WC  . insulin aspart  0-5 Units Subcutaneous QHS  . levothyroxine  112 mcg Oral QAC breakfast  . mouth rinse  15 mL Mouth Rinse q12n4p  . nystatin   Topical BID  . predniSONE  5 mg Oral Q breakfast  . tacrolimus  1 mg Oral QPC breakfast   Continuous Infusions: . sodium chloride 1,000 mL (08/01/18 0855)  . cefTRIAXone (ROCEPHIN)  IV 2 g (07/31/18 1742)  . DAPTOmycin (CUBICIN)  IV Stopped (07/30/18 2359)  . dextrose 5 % and 0.45% NaCl 1,000 mL (08/01/18 1004)   PRN Meds:.acetaminophen **OR** acetaminophen, bisacodyl, hydrALAZINE, magnesium citrate, methocarbamol **OR** [DISCONTINUED] methocarbamol (ROBAXIN) IV, metoCLOPramide **OR** metoCLOPramide (REGLAN) injection, morphine injection, ondansetron **OR** ondansetron (ZOFRAN) IV, oxyCODONE-acetaminophen, polyethylene glycol, senna-docusate  Current Labs: reviewed    Physical Exam:  Blood pressure 124/70, pulse (!) 110, temperature 98.5 F (36.9 C), temperature source Axillary, resp. rate (!) 23, height 5\' 8"  (1.727 m), weight 66 kg, last menstrual period 11/19/2011, SpO2 100 %. Agitated, not intereactive, in restraints R AKA wound vac present Tachy, regular nl s1s2  CTAB No LEE in LLE  A 1. AoCKD3 s/p ddKT 2011; SCr seems 2.5-3.0 on Tac/Pred; last tac level was elevated 10.1 2. 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 AMS, agitation 4. Anemia related to #1 and #2 5. Frail,  debilitated  P . Cont to follow . Agree with GOC conversation . F/u Tac level, cont 1mg  daily and pred . Medication Issues; o Preferred narcotic agents for pain control are hydromorphone, fentanyl, and methadone. Morphine should not be used.  o Baclofen should be avoided o Avoid oral sodium phosphate and magnesium citrate based laxatives / bowel preps    Sabra Heckyan Bindi Klomp MD 08/01/2018, 11:37 AM  Recent Labs  Lab 04/02/18 1133 07/31/18 0536 08/01/18 0748  NA 140 140 141  K 3.6 3.9 3.6  CL 110 110 111  CO2 22 17* 21*  GLUCOSE 107* 185* 174*  BUN 23* 23* 24*  CREATININE 2.75* 2.85* 2.96*  CALCIUM 8.1* 8.1* 8.3*  PHOS 3.0 3.6 3.3   Recent Labs  Lab 08/03/2018 0620 07/28/18 0457 04/02/18 1133 07/31/18 0536  WBC 6.9 6.3 5.5 10.5  NEUTROABS 5.2 4.7 4.0  --   HGB 10.9* 9.4* 7.7* 7.8*  HCT 35.5* 30.6* 25.2* 27.4*  MCV 88.3 87.4 90.3 92.3  PLT 77* 70* 148* 111*

## 2018-08-01 NOTE — Progress Notes (Signed)
  Speech Language Pathology Treatment: Dysphagia  Patient Details Name: Exie Parodyamela S Boshers MRN: [id number] DOB: Nov 29, 1965 Today's Date: 08/01/2018 Time: [id number] SLP Time Calculation (min) (ACUTE ONLY): 11 min  Assessment / Plan / Recommendation Clinical Impression  Pt is alert today but restless in bed and not following commands. She has a mouth-open posture, with oral cavity and lips very dry. Attempts were made for safer repositioning, but pt was actively resistant and would slide back to her L side. SLP repositioned her as able to do so, and then attempted to perform oral care, but pt pursed her lips tightly closed. Pt's cognitive status puts her at a high risk for aspiration, although suspect that she would not be able to support herself nutritionally if she were on a PO diet. Recommend that she remain NPO. Will f/u after upcoming palliative care meeting as warranted.   HPI HPI: 52 y.o. 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  s/p R AKA on 07/26/2018. Pt seen by SLP during prior admissions; MBS 11/16/17 revealed functional oral stage, delayed swallow reflex with trace penetration of thin liquids. Thin liquids with chin tuck and dysphagia 2 recommended (due to oral sores). Swallow evaluation ordered as pt noted to cough and desat 15-20 minutes after taking pills in puree. CXR 07/30/18 Increased left mid and lower lung mixed interstitial and airspace opacity with a small left pleural effusion concerning for asymmetric edema or pneumonia      SLP Plan  Continue with current plan of care       Recommendations  Diet recommendations: NPO Medication Administration: Via alternative means                Oral Care Recommendations: Oral care QID Follow up Recommendations: Skilled  Nursing facility SLP Visit Diagnosis: Dysphagia, unspecified (R13.10) Plan: Continue with current plan of care       GO                Maxcine Hamaiewonsky, [place] 08/01/2018, 2:06 PM  Maxcine HamLaura Paiewonsky, M.A. CCC-SLP Acute Herbalistehabilitation Services Pager [phone] Office [phone]

## 2018-08-01 NOTE — Progress Notes (Signed)
Patient ID: [name], female   DOB: 06/05/1966, 52 y.o.   MRN: [id number] Patient is resting comfortably this morning.  The dressing is intact.  There is no drainage in the wound VAC canister.  Anticipate discharge to skilled nursing with the portable wound VAC pump.  The dressing will remain in place for a total of 2 weeks.

## 2018-08-01 NOTE — Progress Notes (Signed)
BG 67. Patient resting in bed at this time with no distress noted. Mentation unchanged. Cassey, Consulting civil engineerCharge RN agrees to just monitor and re-check BG since patient NPO with D5 1/2NS IV running. Patient had NS running with IV ATB previous shift, so BG will most likely slowly increase with time. Will continue to monitor.

## 2018-08-02 ENCOUNTER — Inpatient Hospital Stay (HOSPITAL_COMMUNITY): Payer: Medicare Other

## 2018-08-02 ENCOUNTER — Inpatient Hospital Stay: Payer: Self-pay

## 2018-08-02 DIAGNOSIS — S78111A Complete traumatic amputation at level between right hip and knee, initial encounter: Secondary | ICD-10-CM

## 2018-08-02 DIAGNOSIS — Z7189 Other specified counseling: Secondary | ICD-10-CM

## 2018-08-02 DIAGNOSIS — Z515 Encounter for palliative care: Secondary | ICD-10-CM

## 2018-08-02 LAB — GLUCOSE, CAPILLARY
GLUCOSE-CAPILLARY: 125 mg/dL — AB (ref 70–99)
GLUCOSE-CAPILLARY: 129 mg/dL — AB (ref 70–99)
Glucose-Capillary: 131 mg/dL — ABNORMAL HIGH (ref 70–99)
Glucose-Capillary: 131 mg/dL — ABNORMAL HIGH (ref 70–99)
Glucose-Capillary: 122 mg/dL — ABNORMAL HIGH (ref 70–99)
Glucose-Capillary: 83 mg/dL (ref 70–99)

## 2018-08-02 LAB — RENAL FUNCTION PANEL
Albumin: 1.3 g/dL — ABNORMAL LOW (ref 3.5–5.0)
Anion gap: 9 (ref 5–15)
BUN: 25 mg/dL — AB (ref 6–20)
CHLORIDE: 111 mmol/L (ref 98–111)
CO2: 20 mmol/L — ABNORMAL LOW (ref 22–32)
Calcium: 8.5 mg/dL — ABNORMAL LOW (ref 8.9–10.3)
Creatinine, Ser: 2.89 mg/dL — ABNORMAL HIGH (ref 0.44–1.00)
GFR calc Af Amer: 21 mL/min — ABNORMAL LOW (ref >60)
GFR calc non Af Amer: 18 mL/min — ABNORMAL LOW (ref >60)
GLUCOSE: 135 mg/dL — AB (ref 70–99)
Phosphorus: 3.1 mg/dL (ref 2.5–4.6)
Potassium: 3.6 mmol/L (ref 3.5–5.1)
Sodium: 140 mmol/L (ref 135–145)

## 2018-08-02 LAB — CBC
HEMATOCRIT: 27.2 % — AB (ref 36.0–46.0)
HEMOGLOBIN: 8.2 g/dL — AB (ref 12.0–15.0)
MCH: 26.8 pg (ref 26.0–34.0)
MCHC: 30.1 g/dL (ref 30.0–36.0)
MCV: 88.9 fL (ref 80.0–100.0)
Platelets: 107 10*3/uL — ABNORMAL LOW (ref 150–400)
RBC: 3.06 MIL/uL — AB (ref 3.87–5.11)
RDW: 19.1 % — ABNORMAL HIGH (ref 11.5–15.5)
WBC: 11.7 10*3/uL — ABNORMAL HIGH (ref 4.0–10.5)
nRBC: 0 % (ref 0.0–0.2)

## 2018-08-02 LAB — APTT: aPTT: 37 seconds — ABNORMAL HIGH (ref 24–36)

## 2018-08-02 LAB — PROTIME-INR
INR: 1.39
Prothrombin Time: 16.9 seconds — ABNORMAL HIGH (ref 11.4–15.2)

## 2018-08-02 MED ORDER — NITROGLYCERIN 2 % TD OINT
1.0000 [in_us] | TOPICAL_OINTMENT | Freq: Once | TRANSDERMAL | Status: DC
  Filled 2018-08-02: qty 30

## 2018-08-02 NOTE — Progress Notes (Signed)
Received call from tech about elevated BP 228/122. Pt eyes no longer tracking; withdraws to touch. Pt. assessed and BP retaken. Notified charge RN and MD paged. RRT contacted for assistance and assessment.  Informed MD not able to administer PRN hydralazine since pt. does not have access and MD give order for one time nitro paste.   Patient reassessed by RRT and MD informed MD of change (decrease) in BP after reassessment and retaking BP. MD state ok to hold nitro paste for now given change to BP.   IV team consulted for midline/picc; IV team not able to establish acces and recommended central line. MD notified.

## 2018-08-02 NOTE — Progress Notes (Addendum)
No charge note: Brief note: full note to follow-  Extended meeting had with patient's spouse. He states that he realizes patient is dying, however, at this time he wishes to continue any medical intervention that is offered that will prolong his life. He notes that nursing and the attending MD have told him that patient is improving, and he sees small improvements himself. His main GOC at this point are to continue current level of care in the hopes that [name] will return to a state of mental status that she can speak to him, escalate if necessary, until a physician tells him that patient will not improve to that state.  Code status was discussed. He elects full code status.   Ocie BobKasie Patirica Longshore, AGNP-C Palliative Medicine  Please call Palliative Medicine team phone with any questions [phone]. For individual providers please see AMION.

## 2018-08-02 NOTE — Progress Notes (Signed)
Patient minimally responsive.  Facial grimace to oral care. Some mild posturing/withdrawal of UE to painful stim.  Occasional opening of eyes but not in response to stimulation.    Recheck BP in bilat arm with doppler.  Left arm doppler BP 157.  Automatic cuff 150/89.  IV team at bedside to assess for IV access.  Per RN a palliative meeting is scheduled for this afternoon about 3:30pm  RN to call if assistance needed.

## 2018-08-02 NOTE — Progress Notes (Signed)
ID PROGRESS NOTE  Ms [name] is a 52yo F admitted with wound dehiscence/osteo of her previous BKA that has now been revised to AKA, all infected/devitalized tissue has been removed.   -she no longer needs antibiotics -continue with wound vac and wound care recs - will d/c ceftriaxone and daptomycin(she has received 72hr post amputation)  If questions, please call.  Duke Salviaynthia B. Drue SecondSnider MD MPH [facility] [phone]

## 2018-08-02 NOTE — Progress Notes (Signed)
   May Proceed with Placement of midline/PICC Line for IV access  Patient is not being considered for hemodialysis  Shon Haleourage Rushton Early, MD

## 2018-08-02 NOTE — Progress Notes (Signed)
Spoke with charge nurse regarding request for midline/PICC.  She will contact MD to consult with nephrology for Midline approval.  Gasper LloydKerry Laythan Hayter, RN VAST

## 2018-08-02 NOTE — Procedures (Signed)
Central Venous Catheter Insertion Procedure Note [name] [id number] 01-07-1966  Procedure: Insertion of Central Venous Catheter Indications: Drug and/or fluid administration, Frequent blood sampling and no PIV access available  Procedure Details Consent: Risks of procedure as well as the alternatives and risks of each were explained to the (patient/caregiver).  Consent for procedure obtained. Time Out: Verified patient identification, verified procedure, site/side was marked, verified correct patient position, special equipment/implants available, medications/allergies/relevent history reviewed, required imaging and test results available.  Performed  Maximum sterile technique was used including antiseptics, cap, gloves, gown, hand hygiene, mask and sheet. Skin prep: Chlorhexidine; local anesthetic administered A antimicrobial bonded/coated triple lumen catheter was placed in the left internal jugular vein using the Seldinger technique to 20 cm.  Line sutured.  Biopatch and sterile dressing applied.   Evaluation Blood flow good Complications: No apparent complications Patient did tolerate procedure well. Chest X-ray ordered to verify placement.  CXR: pending.  Procedure performed with ultrasound guidance for real time vessel cannulation.     [name] , AGACNP-BC [facility] Pgr: [phone] or if no answer [phone] 08/02/2018, 9:24 PM

## 2018-08-02 NOTE — Progress Notes (Signed)
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 Has not received p.o. medications including Prograf and prednisone for the past several days . Goals of care meeting today . Remains somnolent, not interactive, delirious . Serum creatinine stable at 2.9, BUN 25, potassium 3.6, bicarbonate 20   11/25 0701 - 11/26 0700 In: 707.4 [I.V.:607.4; IV Piggyback:100] Out: 400 [Urine:400]  Filed Weights   07/31/18 0500 08/01/18 0500 08/02/18 0500  Weight: 64.7 kg 66 kg 67.5 kg    Scheduled Meds: . chlorhexidine  15 mL Mouth Rinse BID  . Chlorhexidine Gluconate Cloth  6 each Topical Daily  . Gerhardt's butt cream   Topical BID  . insulin aspart  0-15 Units Subcutaneous TID WC  . insulin aspart  0-5 Units Subcutaneous QHS  . levothyroxine  112 mcg Oral QAC breakfast  . mouth rinse  15 mL Mouth Rinse q12n4p  . nitroGLYCERIN  1 inch Topical Once  . nystatin   Topical BID  . predniSONE  5 mg Oral Q breakfast  . tacrolimus  1 mg Oral QPC breakfast   Continuous Infusions: . sodium chloride 1,000 mL (08/01/18 0855)  . dextrose 5 % and 0.45% NaCl 75 mL/hr at 08/02/18 0202   PRN Meds:.acetaminophen **OR** acetaminophen, bisacodyl, hydrALAZINE, magnesium citrate, methocarbamol **OR** [DISCONTINUED] methocarbamol (ROBAXIN) IV, metoCLOPramide **OR** metoCLOPramide (REGLAN) injection, morphine injection, ondansetron **OR** ondansetron (ZOFRAN) IV, oxyCODONE-acetaminophen, polyethylene glycol, senna-docusate  Current Labs: reviewed    Physical Exam:  Blood pressure (!) 169/109, pulse 99, temperature (!) 97.4 F (36.3 C), temperature source Axillary, resp. rate 14, height 5\' 8"  (1.727 m), weight 67.5 kg, last menstrual period 11/19/2011, SpO2 100 %. Agitated, not intereactive, in restraints R AKA wound vac present Tachy, regular nl s1s2  CTAB No LEE in LLE  A 1. AoCKD3 s/p ddKT 2011; SCr seems 2.5-3.0 on Tac/Pred; last tac  level was elevated 10.1 2. 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 AMS, agitation 4. Anemia related to #1 and #2 5. Frail, debilitated  P . Agree with GOC conversation . If aggressive therapies are to continue she will need to convert to intravenous tacrolimus and corticosteroid . Since not receiving at the current time, do not need to measure tacrolimus level . Medication Issues; o Preferred narcotic agents for pain control are hydromorphone, fentanyl, and methadone. Morphine should not be used.  o Baclofen should be avoided o Avoid oral sodium phosphate and magnesium citrate based laxatives / bowel preps    Sabra Heckyan Chrishun Scheer MD 08/02/2018, 11:22 AM  Recent Labs  Lab 07/31/18 0536 08/01/18 0748 08/02/18 0520  NA 140 141 140  K 3.9 3.6 3.6  CL 110 111 111  CO2 17* 21* 20*  GLUCOSE 185* 174* 135*  BUN 23* 24* 25*  CREATININE 2.85* 2.96* 2.89*  CALCIUM 8.1* 8.3* 8.5*  PHOS 3.6 3.3 3.1   Recent Labs  Lab 03-22-18 0620 07/28/18 0457 07/17/2018 1133 07/31/18 0536 08/02/18 0520  WBC 6.9 6.3 5.5 10.5 11.7*  NEUTROABS 5.2 4.7 4.0  --   --   HGB 10.9* 9.4* 7.7* 7.8* 8.2*  HCT 35.5* 30.6* 25.2* 27.4* 27.2*  MCV 88.3 87.4 90.3 92.3 88.9  PLT 77* 70* 148* 111* 107*

## 2018-08-02 NOTE — Progress Notes (Signed)
Assessed by 2 IV RN's with ultrasound,no suitable veins for PIV and PICC  Insertion ;vein is small both upper and lower arm.Both arms edematous ,weeping fluids and blood  .Rn made aware.May consider other type of central line.

## 2018-08-02 NOTE — Consult Note (Signed)
Consultation Note Date: 08/02/2018   Patient Name: [name]  DOB: September 26, 1965  MRN: [id number]  Age / Sex: 52 y.o., female  PCP: [name] Rasmussen, MD Referring Physician: Roxan Hockey, MD  Reason for Consultation: Establishing goals of care  HPI/Patient Profile: 52 y.o. female  with past medical history of brittle DM, s/p BKA for osteomyelitis, renal transplant,  admitted on 07/14/2018 with sepsis related to osteomyelitis, s/p AKA. Encephalopathy has failed to clear since surgery s/p aspiration event. Patient not eating, drinking, not progressing, Palliative medicine consulted for [name].     Clinical Assessment and Goals of Care:  I have reviewed medical records including EPIC notes, labs and imaging, assessed the patient and then met at the bedside along with patient's spouse- [name] to discuss diagnosis prognosis, GOC, EOL wishes, disposition and options.  Patient was in bed, did not open eyes to my voice. She has diffuse anasarca (noted albumin 1.3). Cannot participate in [place].  I introduced Palliative Medicine as specialized medical care for people living with serious illness. It focuses on providing relief from the symptoms and stress of a serious illness. The goal is to improve quality of life for both the patient and the family.  We discussed a brief life review of the patient. She and [name] have been married for 20 years. They have a 69 year old son who has Asperger syndrome. [name] has been severely debilitated for quite some time. At baseline she is bed/wheelcheer bound, but able to interact and "be [name]".     We discussed their current illness and what it means in the larger context of their on-going co-morbidities.  Natural disease trajectory and expectations at EOL were discussed. Her husband is able to say that he believes she is at EOL, however, he is still processing this information  and he hears RN staff and physician's say they see small improvements daily that he also sees. For example today he was told by an RN that she was tracking, and she had not been doing this- he sees this a her making small improvements. He notes that he was prepared and thought [name] was going to die on Sunday- and notes that physician on Monday discussed her improvement with him.  We discussed that while this small improvement is better than she was, overall, her illness trajectory and prognosis for recovery to "back to [facility]" is small. [name] is able to verbalize this, and states that he "wants what is best for [name], even in death". He states that if he knew there was no chance for recovery he would "take her home" He does not want her to die in a hospital- he wants her to die at home.   I attempted to elicit values and goals of care important to the patient. We discussed her feelings if she were not able to recover to point of interacting with her family and her son.   The difference between aggressive medical intervention and comfort care was considered in light of the patient's goals of  care. [name] states that he is processing the need to "let her go". He is concerned about the reaction from [name]'s family members. He also feels he needs to prepare his son.   Advanced directives, concepts specific to code status, artifical feeding and hydration, and rehospitalization were considered and discussed. I advised [name] that if patient required full code resuscitation it is unlikely that she would be able to go home and meet his goals of dying in the home.   Hospice and Palliative Care services outpatient were explained and offered. [name] experienced Hospice services with his mother who also died at home with Hospice.   Questions and concerns were addressed.  Hard Choices booklet left for review. The family was encouraged to call with questions or concerns.    Primary Decision Maker NEXT OF KIN-  spouse- [name]    SUMMARY OF RECOMMENDATIONS -[name] requests full code and full scope treatment for now -I believe he is processing the fact that [name] is at end of life, he asked appropriate questions about Hospice and comfort care, and had questions about paying for her burial -I gave him the Palliative team contact information and encouraged him to call us after he has had time to consider our discussion if he had any changes to his wishes or questions -Recommend straightforward discussion from providers regarding patient's status and likely prognosis- we discussed that while there are things we can do "to" [name], they may not necessarily be things we are doing "for" [name] that are improving her functional state and quality of life- including artificial feeding, resuscitation and continued life prolonging measures.     Code Status/Advance Care Planning:  Full code     Prognosis  Unable to determine  Discharge Planning: To Be Determined  Primary Diagnoses: Present on Admission: . Osteomyelitis ([place]) . Pressure injury of skin . Severe protein-calorie malnutrition ([place]) . Diabetic polyneuropathy associated with type 1 diabetes mellitus ([place]) . Dehiscence of amputation stump ([place])   I have reviewed the medical record, interviewed the patient and family, and examined the patient. The following aspects are pertinent.  Past Medical History:  Diagnosis Date  . Arthritis    "elbows, knees, legs, back" (09/24/2015)  . CKD (chronic kidney disease)   . Daily headache   . Depression    "years ago"  . End stage renal disease (H[facility])    right arm AV graft, resolved post transplant  . Hypertension   . Hypothyroid   . 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 Kidney disease 2011  . Pneumonia ~ 2007?  [name] Type II diabetes mellitus ([facility])    Insulin dependant   Social History   Socioeconomic History  . Marital status: Married    Spouse name: Not on file  . Number of  children: Not on file  . Years of education: Not on file  . Highest education level: Not on file  Occupational History  . Occupation: disabled  Social Needs  . Financial resource strain: Not on file  . Food insecurity:    Worry: Not on file    Inability: Not on file  . Transportation needs:    Medical: Not on file    Non-medical: Not on file  Tobacco Use  . Smoking status: Former Smoker    Packs/day: 1.00    Years: 11.00    Pack years: 11.00    Types: Cigarettes    Last attempt to quit: 09/21/1998    Years since quitting: 19.8  .  Smokeless tobacco: Never Used  Substance and Sexual Activity  . Alcohol use: No  . Drug use: No  . Sexual activity: Never  Lifestyle  . Physical activity:    Days per week: Not on file    Minutes per session: Not on file  . Stress: Not on file  Relationships  . Social connections:    Talks on phone: Not on file    Gets together: Not on file    Attends religious service: Not on file    Active member of club or organization: Not on file    Attends meetings of clubs or organizations: Not on file    Relationship status: Not on file  Other Topics Concern  . Not on file  Social History Narrative  . Not on file   Family History  Problem Relation Age of Onset  . Emphysema Mother   . Throat cancer Mother   . COPD Mother   . Cancer Mother   . Emphysema Father   . COPD Father   . Stroke Father   . ADD / ADHD Son    Scheduled Meds: . chlorhexidine  15 mL Mouth Rinse BID  . Chlorhexidine Gluconate Cloth  6 each Topical Daily  . Gerhardt's butt cream   Topical BID  . insulin aspart  0-15 Units Subcutaneous TID WC  . insulin aspart  0-5 Units Subcutaneous QHS  . levothyroxine  112 mcg Oral QAC breakfast  . mouth rinse  15 mL Mouth Rinse q12n4p  . nitroGLYCERIN  1 inch Topical Once  . nystatin   Topical BID  . predniSONE  5 mg Oral Q breakfast  . tacrolimus  1 mg Oral QPC breakfast   Continuous Infusions: . sodium chloride 1,000 mL  (08/01/18 0855)  . dextrose 5 % and 0.45% NaCl 75 mL/hr at 08/02/18 0202   PRN Meds:.acetaminophen **OR** acetaminophen, bisacodyl, hydrALAZINE, magnesium citrate, methocarbamol **OR** [DISCONTINUED] methocarbamol (ROBAXIN) IV, metoCLOPramide **OR** metoCLOPramide (REGLAN) injection, morphine injection, ondansetron **OR** ondansetron (ZOFRAN) IV, oxyCODONE-acetaminophen, polyethylene glycol, senna-docusate Medications Prior to Admission:  Prior to Admission medications   Medication Sig Start Date End Date Taking? Authorizing Provider  ARIPiprazole (ABILIFY) 2 MG tablet Take 2 mg by mouth daily.   Yes [provider]  carbamide peroxide (DEBROX) 6.5 % OTIC solution Place 10 drops into the left ear 2 (two) times daily.    Yes [provider]  carvedilol (COREG) 6.25 MG tablet Take 6.25 mg by mouth 2 (two) times daily with a meal. HOLD if SBP <110 or HR <60   Yes [provider]  Cholecalciferol (VITAMIN D) 50 MCG (2000 UT) tablet Take 2,000 Units by mouth daily.   Yes [provider]  glucagon (GLUCAGON EMERGENCY) 1 MG injection Inject 1 mg into the vein once as needed (CBG <60 or if resident cannot swallow. If second dose is needed call provider).   Yes [provider]  insulin aspart (NOVOLOG) 100 UNIT/ML injection Inject 4-10 Units into the skin See admin instructions. Inject 4-10 units subcutaneously before meals and at bedtime per sliding scale: CBG 201-250 4 units, 251-300 6 units, 301-350 8 units, 351-400 10 units, >400 notify provider.   Yes [provider]  insulin detemir (LEVEMIR) 100 UNIT/ML injection Inject 8 Units into the skin daily.   Yes [provider]  levothyroxine (SYNTHROID, LEVOTHROID) 112 MCG tablet Take 112 mcg by mouth daily.   Yes [provider]  mirtazapine (REMERON) 7.5 MG tablet Take 7.5 mg by mouth  at bedtime.   Yes [provider]  Multiple Vitamins-Minerals (MULTIVITAMIN WITH MINERALS)  tablet Take 1 tablet by mouth daily.   Yes [provider]  Nutritional Supplements (NUTRITIONAL SUPPLEMENT PO) Take 120 mLs by mouth 4 (four) times daily. [employer]   Yes [provider]  oxyCODONE (OXY IR/ROXICODONE) 5 MG immediate release tablet Take 5 mg by mouth every 4 (four) hours as needed for moderate pain or severe pain.   Yes [provider]  predniSONE (DELTASONE) 5 MG tablet Take 5 mg by mouth daily.  05/29/15  Yes [provider]  PROTEIN PO Take by mouth 2 (two) times daily. For wound healing   Yes [provider]  senna-docusate (SENOKOT-S) 8.6-50 MG tablet Take 1 tablet by mouth at bedtime. Patient taking differently: Take 1 tablet by mouth every 12 (twelve) hours as needed (constipation).  11/12/17  Yes Florencia Reasons, MD  sodium bicarbonate 650 MG tablet Take 2 tablets (1,300 mg total) by mouth 2 (two) times daily. 09/04/17  Yes Velvet Bathe, MD  tacrolimus (PROGRAF) 1 MG capsule Take 1 mg by mouth daily.  11/20/17  Yes [provider]  vitamin C (ASCORBIC ACID) 500 MG tablet Take 500 mg by mouth 2 (two) times daily.   Yes [provider]   Allergies  Allergen Reactions  . Ace Inhibitors Cough  . Adhesive [Tape] Itching and Other (See Comments)    Please use paper tape  . Latex Itching  . Lisinopril Cough   Review of Systems  Unable to perform ROS: Acuity of condition    Physical Exam  Vital Signs: BP (!) 170/89   Pulse 96   Temp 98.9 F (37.2 C) (Axillary)   Resp 15   Ht 5' 8" (1.727 m)   Wt 67.5 kg   LMP 11/19/2011 Comment: pt had tubal ligation  SpO2 96%   BMI 22.63 kg/m  Pain Scale: Faces POSS *See Group Information*: 1-Acceptable,Awake and alert Pain Score: 0-No pain   SpO2: SpO2: 96 % O2 Device:SpO2: 96 % O2 Flow Rate: .O2 Flow Rate (L/min): 1 L/min  IO: Intake/output summary:   Intake/Output Summary (Last 24 hours) at 08/02/2018 1838 Last data filed at 08/02/2018 1300 Gross per 24 hour    Intake 0 ml  Output 0 ml  Net 0 ml    LBM: Last BM Date: 07/28/18 Baseline Weight: Weight: 54.9 kg Most recent weight: Weight: 67.5 kg     Palliative Assessment/Data: PPS: 10%   Flowsheet Rows     Most Recent Value  Intake Tab  Referral Department  Hospitalist  Unit at Time of Referral  Intermediate Care Unit  Palliative Care Primary Diagnosis  Neurology  Date Notified  07/31/18  Palliative Care Type  New Palliative care  Reason for referral  Clarify Goals of Care  Date of Admission  07/21/2018  # of days IP prior to Palliative referral  9  Clinical Assessment  Psychosocial & Spiritual Assessment  Palliative Care Outcomes      Thank you for this consult. Palliative medicine will continue to follow and assist as needed.   Time In:1530 Time Out: 1730 Time Total: 120  Minutes Prolonged services billed: yes Greater than 50%  of this time was spent counseling and coordinating care related to the above assessment and plan.  Signed by: Mariana Kaufman, AGNP-C Palliative Medicine    Please contact Palliative Medicine Team phone at [phone] for questions and concerns.  For individual provider: See Shea Evans

## 2018-08-02 NOTE — Progress Notes (Signed)
Patient Demographics:    [name], is a 52 y.o. female, DOB - Aug 21, 1966, ZOX:[id number]  Admit date - 07/17/2018   Admitting Physician Kendell BaneSeyed A Shahmehdi, MD  Outpatient Primary MD for the patient is Dois Davenportichter, Karen L, MD  LOS - 11   No chief complaint on file.       Subjective:    [name] Dickamela Spampinato today has no fevers, no emesis,  No chest pain, clinically worse persistent toxic metabolic encephalopathy, hypoxia has resolved,  patient's husband at bedside  Assessment  & Plan :    Active Problems:   Severe protein-calorie malnutrition (HCC)   Osteomyelitis (HCC)   Pressure injury of skin   Diabetic polyneuropathy associated with type 1 diabetes mellitus (HCC)   Dehiscence of amputation stump (HCC)  Brief Summary:- 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 Awaiting palliative care consult to help delineate goals of care.... Overall prognosis is very poor    Plan:- 1)Sepsis secondary to Rt BKA wound Dehiscence and Osteomyelitis----    no fever . s/p right AKA  07/30/2018, infectious disease consultant advises as per infectious disease okay to discontinue IV daptomycin/Rocephin on 08/02/2018 ( > 72 hrs post amputation date)  2)Toxic metabolic encephalopathy--- multifactorial, suspect secondary to AKI on CKD, as well as infection/osteomyelitis of the right BKA stump with sepsis, persistent toxic metabolic encephalopathy.... Despite revision of right BKA to AKA patient clinically looks worse at risk for further decompensation.[name]. Awaiting palliative care consult to help delineate goals of care Serum  ammonia is 37, repeat CT head pending,  3)Acute on chronic Anemia--- at baseline patient has chronic anemia of CKD, EPO agent as per nephrologist, monitor H&H closely and transfuse as clinically indicated, hemoglobin is down to 8.2, no evidence of active bleeding  4)S/p Deceased Donor Kidney TRansplant --- patient now with AKI superimposed on CKD 3 --- suspect due to dehydration/volume depletion, too lethargic to take  tacrolimus and prednisone, nephrology input appreciated,  creatinine on admission= 3.3 ,   baseline creatinine = 2.4 to 2.6    , creatinine is now= 2.89    , renally adjust medications, avoid nephrotoxic agents/dehydration/hypotension,    5)Thrombocytopenia--- status post transfusion of Nplate on 07/26/2018 and transfusion of 1 unit of platelets on 07/26/18, goal is to keep platelets above 100 k preop,pt got transfuse 2 units of platelets overnight on 07/28/18 , prior to her Rt AKA surgery on11/22/2019, platelet count is > 107K,    6) acute hypoxic respiratory failure--- patient had episode of acute hypoxia presumed aspiration related after she was given medications with applesauce, hypoxia has resolved, speech and swallow evaluation noted, unable to make determination regarding feeding due to patient being too lethargic to participate effectively, hypoxia has resolved, respiratory status has stabilized, after suctioning and nebulizer treatment, continue n.p.o. Status  7)Social/Ethics--- Discussed with  patient's husband, meeting with palliative care services scheduled on 08/02/2018 to discuss goals of care, persistent toxic metabolic encephalopathy.... Despite revision of right BKA to AKA patient clinically looks  worse at risk for further decompensation.[name] Awaiting palliative care consult to help delineate goals of care.... Overall prognosis is very very poor  8)Venous Access--patient with difficulty with venous access, RN PICC line team unable to get access, discussed with IR awaiting  possible placement of PICC line by IR on the imaging  Disposition/Need for in-Hospital Stay- patient unable to be discharged at this time due to  sepsis/wound dehiscence/infection/osteomyelitis, clinically worse with toxic metabolic encephalopathy  Code Status : Full  Disposition Plan  : TBD  Consults  :  Ortho/ID/Nephrology  DVT Prophylaxis  :  ( SCDs on Lt), thrombocytopenia so no heparin  Lab Results  Component Value Date   PLT 107 (L) 08/02/2018    Inpatient Medications  Scheduled Meds: . chlorhexidine  15 mL Mouth Rinse BID  . Chlorhexidine Gluconate Cloth  6 each Topical Daily  . Gerhardt's butt cream   Topical BID  . insulin aspart  0-15 Units Subcutaneous TID WC  . insulin aspart  0-5 Units Subcutaneous QHS  . levothyroxine  112 mcg Oral QAC breakfast  . mouth rinse  15 mL Mouth Rinse q12n4p  . nitroGLYCERIN  1 inch Topical Once  . nystatin   Topical BID  . predniSONE  5 mg Oral Q breakfast  . tacrolimus  1 mg Oral QPC breakfast   Continuous Infusions: . sodium chloride 1,000 mL (08/01/18 0855)  . dextrose 5 % and 0.45% NaCl 75 mL/hr at 08/02/18 0202   PRN Meds:.acetaminophen **OR** acetaminophen, bisacodyl, hydrALAZINE, magnesium citrate, methocarbamol **OR** [DISCONTINUED] methocarbamol (ROBAXIN) IV, metoCLOPramide **OR** metoCLOPramide (REGLAN) injection, morphine injection, ondansetron **OR** ondansetron (ZOFRAN) IV, oxyCODONE-acetaminophen, polyethylene glycol, senna-docusate   Anti-infectives (From admission, onward)   Start     Dose/Rate Route Frequency Ordered Stop   07/26/2018 0745  ceFAZolin (ANCEF) IVPB 2g/100 mL premix  Status:  Discontinued     2 g 200 mL/hr over 30 Minutes Intravenous On call to O.R. 07/26/2018 0731 07/25/2018 1622   07/26/18 2000  DAPTOmycin (CUBICIN) 600 mg in sodium chloride 0.9 % IVPB  Status:  Discontinued     600 mg 224 mL/hr over 30 Minutes Intravenous Every 48 hours 07/25/18 1145 08/02/18 1000   07/20/2018 2000  DAPTOmycin  (CUBICIN) 300 mg in sodium chloride 0.9 % IVPB  Status:  Discontinued     300 mg 212 mL/hr over 30 Minutes Intravenous Every 48 hours 07/12/2018 1543 07/25/18 1145   08/03/2018 1800  cefTRIAXone (ROCEPHIN) 2 g in sodium chloride 0.9 % 100 mL IVPB  Status:  Discontinued     2 g 200 mL/hr over 30 Minutes Intravenous Every 24 hours 07/26/2018 1543 08/02/18 1000        Objective:   Vitals:   08/02/18 1150 08/02/18 1155 08/02/18 1325 08/02/18 1725  BP: (!) 150/89 (!) 157/0 (!) 150/69 (!) 170/89  Pulse: 97  95 96  Resp: 14  14 15   Temp:   98.9 F (37.2 C)   TempSrc:   Axillary   SpO2: 100%   96%  Weight:      Height:        Wt Readings from Last 3 Encounters:  08/02/18 67.5 kg  01/11/18 76.4 kg  01/01/18 83.9 kg    Intake/Output Summary (Last 24 hours) at 08/02/2018 1936 Last data filed at 08/02/2018 1300 Gross per 24 hour  Intake 0 ml  Output 0 ml  Net 0 ml   Physical Exam Patient is examined daily including today on 08/02/18 , exams remain the same  as of yesterday except that has changed   Gen:-Sleepy, arousable, remains lethargic HEENT:- [place].AT, No sclera icterus Neck-Supple Neck,No JVD,.  Nose- [place] 2 L/min Lungs-diminished in bases, no wheezing  CV- S1, S2 normal, regular  Abd-  +ve B.Sounds, Abd Soft, No tenderness,    Extremity/Skin:-Right  AKA stump with wound VAC in situ, left foot with missing big toe Psych-lethargic, disoriented Neuro-Generalized weakness without  new focal deficits, no tremors   Data Review:   Micro Results Recent Results (from the past 240 hour(s))  Surgical pcr screen     Status: None   Collection Time: Aug 09, 2018  6:19 AM  Result Value Ref Range Status   MRSA, PCR NEGATIVE NEGATIVE Final   Staphylococcus aureus NEGATIVE NEGATIVE Final    Comment: (NOTE) The Xpert SA Assay (FDA approved for NASAL specimens in patients 46 years of age and older), is one component of a comprehensive surveillance program. It is not intended to diagnose  infection nor to guide or monitor treatment. Performed at [facility], [address] [place], [place] [address]     Radiology Reports Ct Head Wo Contrast  Result Date: 08/01/2018 CLINICAL DATA:  52 y/o F; increased confusion. Recent treatment for toxic metabolic encephalopathy. EXAM: CT HEAD WITHOUT CONTRAST TECHNIQUE: Contiguous axial images were obtained from the base of the skull through the vertex without intravenous contrast. COMPARISON:  07/25/2018 CT head FINDINGS: Brain: No evidence of acute infarction, hemorrhage, hydrocephalus, extra-axial collection or mass lesion/mass effect. Stable chronic microvascular ischemic changes and volume loss of the brain. Stable small chronic lacunar infarct within the right anterior limb of internal capsule. Vascular: No hyperdense vessel or unexpected calcification. Skull: Normal. Negative for fracture or focal lesion. Sinuses/Orbits: Opacification of sphenoid and maxillary sinuses, partial opacification of the right posterior ethmoid air cells, and diffuse paranasal sinus mucosal thickening. There are chronic inflammatory changes of the walls of maxillary and sphenoid sinuses and sinus secretions are increased in density indicating inspissation or fungal elements. Normal aeration of the mastoid air cells. Bilateral intra-ocular lens replacement. Other: Anasarca of subcutaneous fat in the posterior neck. IMPRESSION: 1. No acute intracranial abnormality identified. 2. Stable chronic microvascular ischemic changes and volume loss of the brain. 3. Stable extensive paranasal sinus disease with chronic inflammatory changes in the maxillary and sphenoid sinuses. Electronically Signed   By: Mitzi Hansen M.D.   On: 08/01/2018 17:59   Ct Head Wo Contrast  Result Date: 07/25/2018 CLINICAL DATA:  52 year old female with altered level of consciousness. EXAM: CT HEAD WITHOUT CONTRAST TECHNIQUE: Contiguous axial images were obtained from the base of  the skull through the vertex without intravenous contrast. COMPARISON:  07/19/2018 FINDINGS: Motif shin artifact decreases sensitivity despite rescanning. Brain: No evidence of acute infarction, hemorrhage, hydrocephalus, extra-axial collection or mass lesion/mass effect. Mild atrophy and chronic small-vessel white matter ischemic changes again noted. Vascular: Vertebral and carotid atherosclerotic calcifications noted. Skull: No acute abnormality. Sinuses/Orbits: No acute abnormality. Mucoperiosteal thickening of the paranasal sinuses again noted. Other: None IMPRESSION: 1. No evidence of acute intracranial abnormality 2. Mild atrophy and chronic small-vessel white matter ischemic changes 3. Chronic paranasal sinus disease. Electronically Signed   By: Harmon Pier M.D.   On: 07/25/2018 12:01   Dg Chest Port 1 View  Result Date: 07/30/2018 CLINICAL DATA:  Hypoxia EXAM: PORTABLE CHEST 1 VIEW COMPARISON:  07/20/2018 FINDINGS: Exam is rotated to the left. Increased left mid and lower lung mixed interstitial and airspace opacities concerning for developing left lung pneumonia  or asymmetric edema. Right lung remains clear. Small left effusion not excluded. No large pneumothorax. Trachea is midline. Aorta atherosclerotic. Bones are osteopenic. IMPRESSION: Increased left mid and lower lung mixed interstitial and airspace opacity with a small left pleural effusion concerning for asymmetric edema or pneumonia. Atherosclerosis Electronically Signed   By: Judie Petit.  Shick M.D.   On: 07/30/2018 13:27   [place] Ekg Site Rite  Result Date: 08/02/2018 If Site Rite image not attached, placement could not be confirmed due to current cardiac rhythm.  [place] Ekg Site Rite  Result Date: 07/28/2018 If Site Rite image not attached, placement could not be confirmed due to current cardiac rhythm.    CBC Recent Labs  Lab 07/17/2018 0620 07/28/18 0457 07/28/2018 1133 07/31/18 0536 08/02/18 0520  WBC 6.9 6.3 5.5 10.5 11.7*  HGB 10.9*  9.4* 7.7* 7.8* 8.2*  HCT 35.5* 30.6* 25.2* 27.4* 27.2*  PLT 77* 70* 148* 111* 107*  MCV 88.3 87.4 90.3 92.3 88.9  MCH 27.1 26.9 27.6 26.3 26.8  MCHC 30.7 30.7 30.6 28.5* 30.1  RDW 18.3* 18.2* 18.6* 19.1* 19.1*  LYMPHSABS 1.1 1.1 1.0  --   --   MONOABS 0.4 0.4 0.5  --   --   EOSABS 0.2 0.1 0.1  --   --   BASOSABS 0.0 0.0 0.0  --   --     Chemistries  Recent Labs  Lab 07/28/18 0457 07/14/2018 1133 07/31/18 0536 08/01/18 0748 08/02/18 0520  NA 136 140 140 141 140  K 4.1 3.6 3.9 3.6 3.6  CL 107 110 110 111 111  CO2 21* 22 17* 21* 20*  GLUCOSE 145* 107* 185* 174* 135*  BUN 21* 23* 23* 24* 25*  CREATININE 2.50* 2.75* 2.85* 2.96* 2.89*  CALCIUM 7.7* 8.1* 8.1* 8.3* 8.5*    Lab Results  Component Value Date   HGBA1C 6.2 (H) 07/26/2018      Component Value Date/Time   BNP 958.8 (H) 11/14/2017 1313    Jeslin Bazinet M.D on 08/02/2018 at 7:36 PM  Pager---[phone] Go to [email or web address] - password [email or web address] for contact info  [facility] - Office  [phone]

## 2018-08-02 NOTE — Progress Notes (Signed)
This RN spoke to [employer] MillsSchorr, NP about patient not having IV access and IV team unable to get PICC line. Patient's poor condition and possibility of declining discussed. Per Schorr, NP Critical care team will come to assess patient to see if central line is to be done.

## 2018-08-02 NOTE — Progress Notes (Signed)
Pt's IV discontinued due to possible infiltration in right arm. IV team attempted X 2, IV started bleeding after insertion. IV fluids stopped due to swelling. MD notified. Patient will possible need a central line due to poor vascular access. RN to continue to monitor CBG and report off to oncoming RN.

## 2018-08-02 NOTE — Progress Notes (Signed)
PT Cancellation Note  Patient Details Name: [name] MRN: [id number] DOB: July 13, 1966   Cancelled Treatment:    Reason Eval/Treat Not Completed: (P) Other (comment)(Nursing deferred treatment) Rapid at bedside upon PT arrival.  Nursing deferred treatment at this time.  Nursing reported pt is having palliative care consult this afternoon.    Will continue efforts per plan of care as pt is medically appropriate.    [address]Bretta Fees, SPTA  Miyah Hampshire 08/02/2018, 12:21 PM

## 2018-08-03 DIAGNOSIS — S78111A Complete traumatic amputation at level between right hip and knee, initial encounter: Secondary | ICD-10-CM

## 2018-08-03 DIAGNOSIS — Z7189 Other specified counseling: Secondary | ICD-10-CM

## 2018-08-03 DIAGNOSIS — Z515 Encounter for palliative care: Secondary | ICD-10-CM

## 2018-08-03 LAB — RENAL FUNCTION PANEL
ANION GAP: 8 (ref 5–15)
Albumin: 1.3 g/dL — ABNORMAL LOW (ref 3.5–5.0)
BUN: 25 mg/dL — ABNORMAL HIGH (ref 6–20)
CO2: 20 mmol/L — ABNORMAL LOW (ref 22–32)
Calcium: 8.6 mg/dL — ABNORMAL LOW (ref 8.9–10.3)
Chloride: 113 mmol/L — ABNORMAL HIGH (ref 98–111)
Creatinine, Ser: 2.9 mg/dL — ABNORMAL HIGH (ref 0.44–1.00)
GFR calc non Af Amer: 18 mL/min — ABNORMAL LOW (ref >60)
GFR, EST AFRICAN AMERICAN: 21 mL/min — AB (ref >60)
GLUCOSE: 83 mg/dL (ref 70–99)
POTASSIUM: 3.2 mmol/L — AB (ref 3.5–5.1)
Phosphorus: 3.2 mg/dL (ref 2.5–4.6)
Sodium: 141 mmol/L (ref 135–145)

## 2018-08-03 LAB — CBC
HEMATOCRIT: 27 % — AB (ref 36.0–46.0)
Hemoglobin: 8 g/dL — ABNORMAL LOW (ref 12.0–15.0)
MCH: 26.8 pg (ref 26.0–34.0)
MCHC: 29.6 g/dL — ABNORMAL LOW (ref 30.0–36.0)
MCV: 90.3 fL (ref 80.0–100.0)
NRBC: 0 % (ref 0.0–0.2)
Platelets: 92 10*3/uL — ABNORMAL LOW (ref 150–400)
RBC: 2.99 MIL/uL — AB (ref 3.87–5.11)
RDW: 19.9 % — ABNORMAL HIGH (ref 11.5–15.5)
WBC: 10.3 10*3/uL (ref 4.0–10.5)

## 2018-08-03 LAB — GLUCOSE, CAPILLARY
Glucose-Capillary: 85 mg/dL (ref 70–99)
Glucose-Capillary: 77 mg/dL (ref 70–99)
Glucose-Capillary: 89 mg/dL (ref 70–99)

## 2018-08-03 MED ORDER — ACETAMINOPHEN 325 MG PO TABS: 650.0000 mg | ORAL_TABLET | Freq: Four times a day (QID) | ORAL | Status: DC | PRN

## 2018-08-03 MED ORDER — SODIUM CHLORIDE 0.9 % IV SOLN
0.5000 mg/h | INTRAVENOUS | Status: DC
  Administered 2018-08-03: 0.5 mg/h via INTRAVENOUS
  Filled 2018-08-03: qty 5

## 2018-08-03 MED ORDER — SODIUM CHLORIDE 0.9 % IV SOLN: 250.0000 mL | INTRAVENOUS | Status: DC | PRN

## 2018-08-03 MED ORDER — LORAZEPAM 2 MG/ML IJ SOLN
1.0000 mg | INTRAMUSCULAR | Status: DC | PRN
  Administered 2018-08-04: 1 mg via INTRAVENOUS
  Filled 2018-08-03: qty 1

## 2018-08-03 MED ORDER — GLYCOPYRROLATE 1 MG PO TABS
1.0000 mg | ORAL_TABLET | ORAL | Status: DC | PRN
  Filled 2018-08-03: qty 1

## 2018-08-03 MED ORDER — ACETAMINOPHEN 650 MG RE SUPP: 650.0000 mg | Freq: Four times a day (QID) | RECTAL | Status: DC | PRN

## 2018-08-03 MED ORDER — SODIUM CHLORIDE 0.9% FLUSH
3.0000 mL | Freq: Two times a day (BID) | INTRAVENOUS | Status: DC
  Administered 2018-08-04: 3 mL via INTRAVENOUS

## 2018-08-03 MED ORDER — HYDROMORPHONE BOLUS VIA INFUSION
0.5000 mg | INTRAVENOUS | Status: DC | PRN
  Filled 2018-08-03: qty 1

## 2018-08-03 MED ORDER — SODIUM CHLORIDE 0.9% FLUSH: 3.0000 mL | INTRAVENOUS | Status: DC | PRN

## 2018-08-03 MED ORDER — GLYCOPYRROLATE 0.2 MG/ML IJ SOLN: 0.2000 mg | INTRAMUSCULAR | Status: DC | PRN

## 2018-08-03 MED ORDER — ALBUTEROL SULFATE (2.5 MG/3ML) 0.083% IN NEBU: 2.5000 mg | INHALATION_SOLUTION | RESPIRATORY_TRACT | Status: DC | PRN

## 2018-08-03 MED ORDER — LORAZEPAM 1 MG PO TABS: 1.0000 mg | ORAL_TABLET | ORAL | Status: DC | PRN

## 2018-08-03 MED ORDER — LORAZEPAM 2 MG/ML PO CONC: 1.0000 mg | ORAL | Status: DC | PRN

## 2018-08-03 NOTE — Progress Notes (Signed)
Nutrition Brief Note  RD drawn to pt for LOS and NPO status.  Chart reviewed. Pt now transitioning to comfort care.  No further nutrition interventions warranted at this time.  Please consult as needed.    Earma ReadingKate Jablonski Myasia Sinatra, MS, RD, LDN Inpatient Clinical Dietitian Pager: [phone] Weekend/After Hours: [phone]

## 2018-08-03 NOTE — Progress Notes (Addendum)
Notes reviewed, patient transitioning to comfort measures.  Will sign off at the current time.

## 2018-08-03 NOTE — Progress Notes (Signed)
Sent for pt to get PICC line. Per pt's nurse, central line was placed last night. PICC line not needed.

## 2018-08-03 NOTE — Progress Notes (Signed)
   08/03/18 1000  Clinical Encounter Type  Visited With Patient and family together  Visit Type Initial  Referral From Nurse  Consult/Referral To Chaplain  Spiritual Encounters  Spiritual Needs Emotional;Prayer  Stress Factors  Patient Stress Factors None identified  Family Stress Factors Major life changes;Loss of control;Health changes  Responded to spiritual care consult. PT was not alert and husband was at bedside. He was in tears and emotional. I offered spiritual care with a listening ear, ministry of presence, words of encouragement and prayer. Chaplain available as needed.   Chaplain Orest DikesAbel Orville Widmann [phone]

## 2018-08-03 NOTE — Progress Notes (Signed)
I was called to assess [name] due to lack of response to deep sternal rub in the setting of toxic metabolic encephalopathy.   [name] is responsive with only facial grimacing to trapezius squeeze and partial eye opening.  She no longer is withdrawing her extremities to pain as documented yesterday.  No medications were given that could alter her mentation.  CBG 85.  PERRLA sluggishly reactive at 4 bilaterally.  No acute interventions by myself. Will discuss with K. Schorr NP.

## 2018-08-03 NOTE — Progress Notes (Signed)
Spoke with husband Casimiro NeedleMichael about patient's condition upon husband's arrival to the unit 820. Called palliative to update/inform about patient's husband Michael's desire to have family assist/support him in making decision. Followed up with Dr. Jerral RalphGhimire.   IR arrived to transport patient for a PICC line; informed team patient has central line and spoke with MD to hold PICC line.   Foley insertion at 1510 for patient comfort.

## 2018-08-03 NOTE — Progress Notes (Signed)
Schorr, NP notified of patient briefly looking at this RN with first shift assessment and unresponsive to sternal rub at this time. Rectal temp 95.1. Patient has not voided. Bladder scan just showing >200 ml multiple times. Schorr, NP notified and states to warm patient with warm blankets and call rapid response.

## 2018-08-03 NOTE — Progress Notes (Signed)
Brief note: (full note to follow later)  Deteriorated last night further-obtunded/encephalopathic-gasping for air this am. On a 100% NRB.   CXR-almost complete opacification of the left lung  Long d/w patients spouse at bedside (RN present)-options including transitioning to comfort care vs aggressive care-Intubation discussed. Spouse does not want patient to suffer any longer-and elected to pursue hospice/comfort measures.   Stop all meds-starting Dilaudid gtt-anticipate inpatient death.  Will d/w palliative care.

## 2018-08-03 NOTE — Progress Notes (Signed)
PROGRESS NOTE        PATIENT DETAILS Name: [name] Age: 52 y.o. Sex: female Date of Birth: 12/11/1965 Admit Date: Aug 13, 2018 Admitting Physician Kendell Bane, MD WUJ:[id number], Marcos Eke, MD  Brief Narrative: Patient is a 52 y.o. female with history of renal transplant on immunosuppressive's, severe PAD-status post right BKA-presented from [facility] with acute metabolic encephalopathy and right BKA wound dehiscence/osteomyelitis.  Patient was evaluated by Dr. Ashley Akin underwent AKA on 11/22.  Unfortunately-even after AKA-patient continued to worsen persistent encephalopathy and worsening hypoxic respiratory failure-with gradual increase in oxygen requirements-and currently on 100% nonrebreather mask.  Chest x-ray on 11/26 shows complete opacification of the left hemithorax.  After extensive discussion with family on 11/27-patient was transition to full comfort measures.  See below for further details  Subjective: Obtunded-not responsive to verbal stimuli-barely opens eyes to vigorous sternal rub.  Tachypneic-gasping for air at times.  Spouse at bedside.  Assessment/Plan: Acute hypoxic respiratory failure: Secondary to aspiration pneumonia, mucous plugging-in the setting of frailty-and worsening debility/deconditioning.  Very encephalopathic as well.  Prolonged discussion with spouse at bedside by this MD (RN present at bedside)-options including intubation/aggressive care versus comfort care was discussed-after extensive discussion-spouse did not want patient to suffer anymore-and  elected to pursue comfort measures.  No role for antimicrobial therapy in this setting  Acute metabolic encephalopathy: Initially thought to be from sepsis-however suspect this is mostly from hypoxia.  Continues to be very encephalopathic this morning-barely arouses with vigorous sternal rub.  Plans were to do an ABG-but since spouse elected comfort measures-we canceled ABG.   Supportive care only at this point.  Sepsis secondary to right BKA wound dehiscence and osteomyelitis: Underwent AKA on 11/22-all antibiotics was discontinued on 11/26.  Due to continued deterioration-see above-has been transition to full comfort measures.  AKI on CKD stage III with history of renal transplant: AKI most likely hemodynamically mediated-apparently not able to take any awful oral immunosuppressive agents due to severe lethargy and encephalopathy.  As noted above-has been transition to full comfort measures.  Anemia: Has anemia of chronic disease at baseline-hemoglobin has worsened further due to acute illness.  Since transition to comfort measures-no role to follow CBC at this point.  Thrombocytopenia: Received Nplate and 1 unit of platelets on 11/19-since transition to full comfort measures-no role to follow CBC at this point.  Insulin-dependent diabetes: Since transition to full comfort measures-have stopped insulin.  Palliative care: Unfortunately rapidly deteriorated over the past few days-in spite of AKA-now with severe hypoxic respiratory failure in the setting of aspiration pneumonia/mucous plugging-after extensive discussion with family at bedside-patient now transition to full comfort measures starting Dilaudid infusion-anticipate hospital that in a few hours/days.  Palliative care following.  DVT Prophylaxis: Not needed  Code Status: DNR  Family Communication: Spouse at bedside  Disposition Plan: Remain inpatient  Antimicrobial agents: Anti-infectives (From admission, onward)   Start     Dose/Rate Route Frequency Ordered Stop   07/14/2018 0745  ceFAZolin (ANCEF) IVPB 2g/100 mL premix  Status:  Discontinued     2 g 200 mL/hr over 30 Minutes Intravenous On call to O.R. 07/12/2018 0731 07/09/2018 1622   07/26/18 2000  DAPTOmycin (CUBICIN) 600 mg in sodium chloride 0.9 % IVPB  Status:  Discontinued     600 mg 224 mL/hr over 30 Minutes Intravenous Every 48 hours  07/25/18 1145  08/02/18 1000   10-09-2017 2000  DAPTOmycin (CUBICIN) 300 mg in sodium chloride 0.9 % IVPB  Status:  Discontinued     300 mg 212 mL/hr over 30 Minutes Intravenous Every 48 hours 10-09-2017 1543 07/25/18 1145   10-09-2017 1800  cefTRIAXone (ROCEPHIN) 2 g in sodium chloride 0.9 % 100 mL IVPB  Status:  Discontinued     2 g 200 mL/hr over 30 Minutes Intravenous Every 24 hours 10-09-2017 1543 08/02/18 1000      Procedures: 11/22>> AKA  CONSULTS:  ID and orthopedic surgery  Time spent: 40 minutes-Greater than 50% of this time was spent in counseling, explanation of diagnosis, planning of further management, and coordination of care.  MEDICATIONS: Scheduled Meds: . nitroGLYCERIN  1 inch Topical Once  . sodium chloride flush  3 mL Intravenous Q12H   Continuous Infusions: . sodium chloride    . HYDROmorphone 0.5 mg/hr (08/03/18 1011)   PRN Meds:.sodium chloride, acetaminophen **OR** acetaminophen, albuterol, glycopyrrolate **OR** glycopyrrolate **OR** glycopyrrolate, HYDROmorphone, LORazepam **OR** LORazepam **OR** LORazepam, sodium chloride flush   PHYSICAL EXAM: Vital signs: Vitals:   08/03/18 0515 08/03/18 0534 08/03/18 0600 08/03/18 0727  BP: 117/62 128/77    Pulse: 90 88    Resp: 15 15    Temp:   (!) 95.1 F (35.1 C) (!) 95 F (35 C)  TempSrc:   Rectal Rectal  SpO2: 98% 99%    Weight:      Height:       Filed Weights   08/01/18 0500 08/02/18 0500 08/03/18 0500  Weight: 66 kg 67.5 kg 66.4 kg   Body mass index is 22.26 kg/m.   General appearance: Obtunded-essentially unresponsive-gasping for air at times. HEENT: Atraumatic and Normocephalic Neck: supple Resp: Decreased air entry on the left-coarse rhonchi all over on the right. CVS: S1 S2 regular, no murmurs.  GI: Bowel sounds present, Non tender and not distended with no gaurding, rigidity or rebound.No organomegaly Extremities: Right AKA-VAC in place. Neurology: Unable to evaluate. Musculoskeletal:No  digital cyanosis Skin:No Rash, warm and dry Wounds:N/A  I have personally reviewed following labs and imaging studies  LABORATORY DATA: CBC: Recent Labs  Lab 07/28/18 0457 07/17/2018 1133 07/31/18 0536 08/02/18 0520 08/03/18 0807  WBC 6.3 5.5 10.5 11.7* 10.3  NEUTROABS 4.7 4.0  --   --   --   HGB 9.4* 7.7* 7.8* 8.2* 8.0*  HCT 30.6* 25.2* 27.4* 27.2* 27.0*  MCV 87.4 90.3 92.3 88.9 90.3  PLT 70* 148* 111* 107* 92*    Basic Metabolic Panel: Recent Labs  Lab 07/23/2018 1133 07/31/18 0536 08/01/18 0748 08/02/18 0520 08/03/18 0320  NA 140 140 141 140 141  K 3.6 3.9 3.6 3.6 3.2*  CL 110 110 111 111 113*  CO2 22 17* 21* 20* 20*  GLUCOSE 107* 185* 174* 135* 83  BUN 23* 23* 24* 25* 25*  CREATININE 2.75* 2.85* 2.96* 2.89* 2.90*  CALCIUM 8.1* 8.1* 8.3* 8.5* 8.6*  PHOS 3.0 3.6 3.3 3.1 3.2    GFR: Estimated Creatinine Clearance: 23.2 mL/min (A) (by C-G formula based on SCr of 2.9 mg/dL (H)).  Liver Function Tests: Recent Labs  Lab 07/20/2018 1133 07/31/18 0536 08/01/18 0748 08/02/18 0520 08/03/18 0320  ALBUMIN 1.6* 1.3* 1.3* 1.3* 1.3*   No results for input(s): LIPASE, AMYLASE in the last 168 hours. Recent Labs  Lab 08/01/18 1306  AMMONIA 37*    Coagulation Profile: Recent Labs  Lab 08/02/18 2200  INR 1.39    Cardiac Enzymes: Recent Labs  Lab 07/31/18 0536  CKTOTAL 60    BNP (last 3 results) No results for input(s): PROBNP in the last 8760 hours.  HbA1C: No results for input(s): HGBA1C in the last 72 hours.  CBG: Recent Labs  Lab 08/02/18 1507 08/02/18 1655 08/02/18 2126 08/03/18 0626 08/03/18 0822  GLUCAP 129* 122* 83 85 77    Lipid Profile: No results for input(s): CHOL, HDL, LDLCALC, TRIG, CHOLHDL, LDLDIRECT in the last 72 hours.  Thyroid Function Tests: No results for input(s): TSH, T4TOTAL, FREET4, T3FREE, THYROIDAB in the last 72 hours.  Anemia Panel: No results for input(s): VITAMINB12, FOLATE, FERRITIN, TIBC, IRON, RETICCTPCT in  the last 72 hours.  Urine analysis:    Component Value Date/Time   COLORURINE YELLOW 01/04/2018 1341   APPEARANCEUR CLOUDY (A) 01/04/2018 1341   LABSPEC 1.015 01/04/2018 1341   PHURINE 6.0 01/04/2018 1341   GLUCOSEU >=500 (A) 01/04/2018 1341   HGBUR LARGE (A) 01/04/2018 1341   BILIRUBINUR NEGATIVE 01/04/2018 1341   KETONESUR 20 (A) 01/04/2018 1341   PROTEINUR 100 (A) 01/04/2018 1341   UROBILINOGEN 0.2 03/02/2015 1153   NITRITE NEGATIVE 01/04/2018 1341   LEUKOCYTESUR MODERATE (A) 01/04/2018 1341    Sepsis Labs: Lactic Acid, Venous    Component Value Date/Time   LATICACIDVEN 1.1 07/25/2018 0448    MICROBIOLOGY: Recent Results (from the past 240 hour(s))  Surgical pcr screen     Status: None   Collection Time: 08/03/2018  6:19 AM  Result Value Ref Range Status   MRSA, PCR NEGATIVE NEGATIVE Final   Staphylococcus aureus NEGATIVE NEGATIVE Final    Comment: (NOTE) The Xpert SA Assay (FDA approved for NASAL specimens in patients 59 years of age and older), is one component of a comprehensive surveillance program. It is not intended to diagnose infection nor to guide or monitor treatment. Performed at [facility], [address], [place], [place] [address]     RADIOLOGY STUDIES/RESULTS: Ct Head Wo Contrast  Result Date: 08/01/2018 CLINICAL DATA:  52 y/o F; increased confusion. Recent treatment for toxic metabolic encephalopathy. EXAM: CT HEAD WITHOUT CONTRAST TECHNIQUE: Contiguous axial images were obtained from the base of the skull through the vertex without intravenous contrast. COMPARISON:  07/25/2018 CT head FINDINGS: Brain: No evidence of acute infarction, hemorrhage, hydrocephalus, extra-axial collection or mass lesion/mass effect. Stable chronic microvascular ischemic changes and volume loss of the brain. Stable small chronic lacunar infarct within the right anterior limb of internal capsule. Vascular: No hyperdense vessel or unexpected calcification. Skull:  Normal. Negative for fracture or focal lesion. Sinuses/Orbits: Opacification of sphenoid and maxillary sinuses, partial opacification of the right posterior ethmoid air cells, and diffuse paranasal sinus mucosal thickening. There are chronic inflammatory changes of the walls of maxillary and sphenoid sinuses and sinus secretions are increased in density indicating inspissation or fungal elements. Normal aeration of the mastoid air cells. Bilateral intra-ocular lens replacement. Other: Anasarca of subcutaneous fat in the posterior neck. IMPRESSION: 1. No acute intracranial abnormality identified. 2. Stable chronic microvascular ischemic changes and volume loss of the brain. 3. Stable extensive paranasal sinus disease with chronic inflammatory changes in the maxillary and sphenoid sinuses. Electronically Signed   By: Mitzi Hansen M.D.   On: 08/01/2018 17:59   Ct Head Wo Contrast  Result Date: 07/25/2018 CLINICAL DATA:  52 year old female with altered level of consciousness. EXAM: CT HEAD WITHOUT CONTRAST TECHNIQUE: Contiguous axial images were obtained from the base of the skull through the vertex without intravenous contrast. COMPARISON:  07/19/2018  FINDINGS: Motif shin artifact decreases sensitivity despite rescanning. Brain: No evidence of acute infarction, hemorrhage, hydrocephalus, extra-axial collection or mass lesion/mass effect. Mild atrophy and chronic small-vessel white matter ischemic changes again noted. Vascular: Vertebral and carotid atherosclerotic calcifications noted. Skull: No acute abnormality. Sinuses/Orbits: No acute abnormality. Mucoperiosteal thickening of the paranasal sinuses again noted. Other: None IMPRESSION: 1. No evidence of acute intracranial abnormality 2. Mild atrophy and chronic small-vessel white matter ischemic changes 3. Chronic paranasal sinus disease. Electronically Signed   By: Harmon Pier M.D.   On: 07/25/2018 12:01   Dg Chest Port 1 View  Result Date:  08/02/2018 CLINICAL DATA:  52 y/o  F; encounter for central line placement. EXAM: PORTABLE CHEST 1 VIEW COMPARISON:  07/30/2018 chest radiograph. FINDINGS: Near complete opacification of left hemithorax. Left central venous catheter tip projects over the right atrium. Cardiac silhouette is obscured by left hemithorax opacification. Stable hazy opacification at the right lung base. Calcific aortic atherosclerosis. No acute osseous abnormality is evident. IMPRESSION: 1. Left central venous catheter tip projects over the right atrium. 2. Progressed near complete opacification of left hemithorax. Stable hazy opacification of the right lung base. Electronically Signed   By: Mitzi Hansen M.D.   On: 08/02/2018 22:26   Dg Chest Port 1 View  Result Date: 07/30/2018 CLINICAL DATA:  Hypoxia EXAM: PORTABLE CHEST 1 VIEW COMPARISON:  07/20/2018 FINDINGS: Exam is rotated to the left. Increased left mid and lower lung mixed interstitial and airspace opacities concerning for developing left lung pneumonia or asymmetric edema. Right lung remains clear. Small left effusion not excluded. No large pneumothorax. Trachea is midline. Aorta atherosclerotic. Bones are osteopenic. IMPRESSION: Increased left mid and lower lung mixed interstitial and airspace opacity with a small left pleural effusion concerning for asymmetric edema or pneumonia. Atherosclerosis Electronically Signed   By: Judie Petit.  Shick M.D.   On: 07/30/2018 13:27   [place] Ekg Site Rite  Result Date: 08/02/2018 If Site Rite image not attached, placement could not be confirmed due to current cardiac rhythm.  [place] Ekg Site Rite  Result Date: 07/28/2018 If Site Rite image not attached, placement could not be confirmed due to current cardiac rhythm.    LOS: 12 days   Jeoffrey Massed, MD  [facility]  If 7PM-7AM, please contact night-coverage  Please page via [email or web address]-Password [email or web address]-click on MD name and type text message  08/03/2018, 10:31  AM

## 2018-08-03 NOTE — Progress Notes (Signed)
PT Cancellation Note  Patient Details Name: [name] MRN: [id number] DOB: 1966-01-09   Cancelled Treatment:    Reason Eval/Treat Not Completed: Medical issues which prohibited therapy Patient medically declining at this time. Not appropriate for therapy, will cancel for today.  Wannetta SenderShelby Aneesh Faller,SPTA 08/03/2018, 9:35 AM

## 2018-08-03 NOTE — Progress Notes (Signed)
Pt transferred to 6N11 from 5W. Husband at bedside. Pt is comfort care. Dilaudid drip at 601ml/h 0.645mh/hr. BP=75/54 Breathing about 6 bpm, HR=80bpm. Will continue to monitor

## 2018-08-04 DIAGNOSIS — R0902 Hypoxemia: Secondary | ICD-10-CM

## 2018-08-04 MED ORDER — HYDROMORPHONE BOLUS VIA INFUSION
0.5000 mg | INTRAVENOUS | Status: DC | PRN
  Administered 2018-08-04 (×2): 1 mg via INTRAVENOUS
  Filled 2018-08-04: qty 1

## 2018-08-07 NOTE — Progress Notes (Signed)
Daily Progress Note   Patient Name: [name]       Date: 08/30/2018 DOB: [birth year]  Age: 52 y.o. MRN#: [id number] Attending Physician: Maretta Bees, MD Primary Care Physician: Dois Davenport, MD Admit Date: 07/24/2018  Reason for Consultation/Follow-up: Establishing goals of care, Non pain symptom management, Pain control and Psychosocial/spiritual support  Subjective: Examined patient and spoke with husband at bedside.  He showed me pictures of her months ago.  He talked about her joking lovable personality.  He talked about her funeral arrangements.    The family has chosen to use [employer]  We discussed removing the venturi mask.  While I was present we decreased her oxygen from 10L to 2L nasal canula.  [name]'s comfort level did not appear to change at all.    Kathlene November asked about time frames.  He is here alone with her until she dies. (the rest of her family will not return.   Assessment: Patient is actively dying. Breathing is apneic.  She is non-responsive.   Patient Profile/HPI:  52 y.o. female  with past medical history of brittle DM, s/p BKA for osteomyelitis, renal transplant,  admitted on 08/03/2018 with sepsis related to osteomyelitis, s/p AKA. Encephalopathy has failed to clear since surgery s/p aspiration event. Patient not eating, drinking, not progressing, Palliative medicine consulted for GOC.     Length of Stay: 13  Current Medications: Scheduled Meds:  . nitroGLYCERIN  1 inch Topical Once  . sodium chloride flush  3 mL Intravenous Q12H    Continuous Infusions: . sodium chloride    . HYDROmorphone 0.5 mg/hr (30-Aug-2018 0602)    PRN Meds: sodium chloride, acetaminophen **OR** acetaminophen, albuterol, glycopyrrolate **OR** glycopyrrolate  **OR** glycopyrrolate, HYDROmorphone, LORazepam **OR** LORazepam **OR** LORazepam, sodium chloride flush  Physical Exam        Well developed female, actively dying CV rrr resp apneic Extremities - hands are swollen and cold to touch  Vital Signs: BP (!) 69/47 (BP Location: Right Arm)   Pulse 69   Temp (!) 95 F (35 C) (Rectal)   Resp (!) 5   Ht 5\' 8"  (1.727 m)   Wt 66.4 kg   LMP 11/19/2011 Comment: pt had tubal ligation  SpO2 (!) 81%   BMI 22.26 kg/m  SpO2: SpO2: (!) 81 % O2 Device:  O2 Device: Venturi Mask O2 Flow Rate: O2 Flow Rate (L/min): 10 L/min  Intake/output summary:   Intake/Output Summary (Last 24 hours) at 07/20/2018 1035 Last data filed at 08/01/2018 0559 Gross per 24 hour  Intake 19.77 ml  Output 550 ml  Net -530.23 ml   LBM: Last BM Date: 07/28/18 Baseline Weight: Weight: 54.9 kg Most recent weight: Weight: 66.4 kg       Palliative Assessment/Data: 10%    Flowsheet Rows     Most Recent Value  Intake Tab  Referral Department  Hospitalist  Unit at Time of Referral  Intermediate Care Unit  Palliative Care Primary Diagnosis  Neurology  Date Notified  07/31/18  Palliative Care Type  New Palliative care  Reason for referral  Clarify Goals of Care  Date of Admission  07/21/2018  # of days IP prior to Palliative referral  9  Clinical Assessment  Psychosocial & Spiritual Assessment  Palliative Care Outcomes      Patient Active Problem List   Diagnosis Date Noted  . Above knee amputation of right lower extremity (HCC)   . Advanced care planning/counseling discussion   . Goals of care, counseling/discussion   . Palliative care by specialist   . Pressure injury of skin 07/23/2018  . Diabetic polyneuropathy associated with type 1 diabetes mellitus (HCC)   . Dehiscence of amputation stump (HCC)   . Osteomyelitis (HCC) 07/21/2018  . Gangrene of right foot (HCC)   . Venous ulcer of right leg (HCC)   . Diabetic ulcer of lower leg (HCC)   . Cellulitis  of leg, left 01/04/2018  . Acute on chronic congestive heart failure (HCC)   . HCAP (healthcare-associated pneumonia) 11/14/2017  . Localized swelling of lower extremity 11/05/2017  . Severe protein-calorie malnutrition (HCC) 09/02/2017  . Pneumonia   . Status post kidney transplant 06/02/2017  . Closed displaced fracture of right femoral neck (HCC) 06/02/2017  . Femoral neck fracture (HCC) 06/02/2017  . Hyperglycemia 05/09/2017  . Lower extremity cellulitis 03/23/2017  . Type II diabetes mellitus (HCC) 03/23/2017  . Anemia of chronic disease 01/26/2017  . Abnormal LFTs 01/26/2017  . DKA (diabetic ketoacidoses) (HCC) 08/12/2016  . Hypoglycemia   . Wound cellulitis   . CKD (chronic kidney disease) stage 3, GFR 30-59 ml/min (HCC) 06/26/2016  . Cellulitis of right lower extremity   . QT prolongation 05/13/2016  . UTI (lower urinary tract infection)   . Hypomagnesemia   . Pressure ulcer 04/11/2016  . Acute encephalopathy 04/11/2016  . Altered mental status 04/10/2016  . Insulin dependent diabetes mellitus (HCC) 04/10/2016  . Diabetic ketoacidosis without coma associated with type 2 diabetes mellitus (HCC) 11/02/2015  . Uncontrolled type 1 diabetes mellitus with foot ulcer (HCC) 06/04/2015  . Foot ulcer, right (HCC)   . Diarrhea 06/02/2015  . Type 1 diabetes mellitus with diabetic foot ulcer (HCC) 06/02/2015  . Acute renal failure superimposed on stage 3 chronic kidney disease (HCC) 06/02/2015  . Diabetic foot infection (HCC) 06/01/2015  . Essential hypertension 06/01/2015  . GERD (gastroesophageal reflux disease) 06/01/2015  . AKI (acute kidney injury) (HCC) 01/02/2015  . H/O kidney transplant 08/04/2012  . Hyponatremia 09/22/2011  . Immunosuppression (HCC)   . Hypothyroidism     Palliative Care Plan    Recommendations/Plan:  Continue comfort care.   Please request Chaplain presence after patient's death.  Goals of Care and Additional Recommendations:  Limitations on  Scope of Treatment: Full Comfort Care  Code Status:  DNR  Prognosis:  Hours - Days   Discharge Planning:  Anticipated Hospital Death  Care plan was discussed with husband and RN  Thank you for allowing the Palliative Medicine Team to assist in the care of this patient.  Total time spent:  35 min.     Greater than 50%  of this time was spent counseling and coordinating care related to the above assessment and plan.  Norvel RichardsMarianne Delbert Darley, PA-C Palliative Medicine  Please contact Palliative MedicineTeam phone at [phone] for questions and concerns between 7 am - 7 pm.   Please see AMION for individual provider pager numbers.

## 2018-08-07 NOTE — Progress Notes (Addendum)
[employer] made aware of the death. Referral no. [id number], body is not suitable for organ donation  Spoke to Page crater.

## 2018-08-07 NOTE — Progress Notes (Signed)
Pt expired at 1545hrs, death certified by second nurse Lysle MoralesMathew. MD text paged

## 2018-08-07 NOTE — Progress Notes (Signed)
Additional details regarding funeral may be obtained from pt's husband's sister in law Britta MccreedyBarbara and her husband Albin FischerMichael Crumpton on [phone]

## 2018-08-07 NOTE — Progress Notes (Signed)
Generalized body twitching noted again, 1mg  iv bolus given

## 2018-08-07 NOTE — Progress Notes (Signed)
Pt started having body shakes and appeared in some type of distress, 1mg  bolus given and continuous infusion increased to 2mg /hr . Pt 's husband agreable

## 2018-08-07 NOTE — Progress Notes (Signed)
 1mg  prn ativan administered for comfort, pt's husband consulted before medicating and agreable

## 2018-08-07 NOTE — Progress Notes (Signed)
Seen and examined earlier this am-unresponsive-actively dying with apneic spells.  Spouse at bedside-talks about funeral arrangements Continue full comfort measures.

## 2018-08-07 NOTE — Progress Notes (Signed)
 23mg  iv dilaudid wasted with Sharyn DrossZenaida Calwitan after pt expiry this pm

## 2018-08-07 DEATH — deceased

## 2018-09-07 NOTE — Death Summary Note (Signed)
DEATH SUMMARY   Patient Details  Name: [name] MUNGIN MRN: [id number] DOB: 1966-05-29  Admission/Discharge Information   Admit Date:  07/31/18  Date of Death: Date of Death: 2018/08/13  Time of Death: Time of Death: 1545  Length of Stay: 01-27-2023  Referring Physician: Dois Davenport, MD   Reason(s) for Hospitalization  Acute metabolic encephalopathy and right BKA wound dehiscence and osteomyelitis.  Diagnoses  Preliminary cause of death:  Secondary Diagnoses (including complications and co-morbidities):  Active Problems:   Severe protein-calorie malnutrition (HCC)   Osteomyelitis (HCC)   Pressure injury of skin   Diabetic polyneuropathy associated with type 1 diabetes mellitus (HCC)   Dehiscence of amputation stump (HCC)   Above knee amputation of right lower extremity (HCC)   Advanced care planning/counseling discussion   Goals of care, counseling/discussion   Palliative care by specialist   Hypoxia   Brief Hospital Course (including significant findings, care, treatment, and services provided and events leading to death)  Brief Narrative: Patient is a 53 y.o. female with history of renal transplant on immunosuppressive's, severe PAD-status post right BKA-presented from [facility] with acute metabolic encephalopathy and right BKA wound dehiscence/osteomyelitis.  Patient was evaluated by Dr. Ashley Akin underwent AKA on 11/22.  Unfortunately-even after AKA-patient continued to worsen persistent encephalopathy and worsening hypoxic respiratory failure-with gradual increase in oxygen requirements-and currently on 100% nonrebreather mask.  Chest x-ray on 11/26 shows complete opacification of the left hemithorax.  After extensive discussion with family on 11/27-patient was transitioned to full comfort measures.  Patient subsequently expired on 08/14/2023 at 1545 hrs.  Hospital course by problem list  Acute hypoxic respiratory failure: Secondary to aspiration pneumonia, mucous  plugging-in the setting of frailty-and worsening debility/deconditioning.  Very encephalopathic as well.  Prolonged discussion with spouse at bedside by this MD (RN present at bedside)-options including intubation/aggressive care versus comfort care was discussed-after extensive discussion-spouse did not want patient to suffer anymore-and  elected to pursue comfort measures.  No role for antimicrobial therapy in this setting.  Patient was started on a Dilaudid infusion-patient expired on 2023-08-14 at 1545 hrs.  Acute metabolic encephalopathy: Initially thought to be from sepsis-however suspect this is mostly from hypoxia.    Sepsis secondary to right BKA wound dehiscence and osteomyelitis: Underwent AKA on 11/22-all antibiotics was discontinued on 11/26.  Due to continued deterioration-see above-has been transition to full comfort measures.  AKI on CKD stage III with history of renal transplant: AKI most likely hemodynamically mediated  Anemia: Has anemia of chronic disease at baseline-hemoglobin worsened further due to acute illness.   Thrombocytopenia: Received Nplate and 1 unit of platelets on 11/19  Pertinent Labs and Studies  Significant Diagnostic Studies Ct Head Wo Contrast  Result Date: 08/01/2018 CLINICAL DATA:  53 y/o F; increased confusion. Recent treatment for toxic metabolic encephalopathy. EXAM: CT HEAD WITHOUT CONTRAST TECHNIQUE: Contiguous axial images were obtained from the base of the skull through the vertex without intravenous contrast. COMPARISON:  07/25/2018 CT head FINDINGS: Brain: No evidence of acute infarction, hemorrhage, hydrocephalus, extra-axial collection or mass lesion/mass effect. Stable chronic microvascular ischemic changes and volume loss of the brain. Stable small chronic lacunar infarct within the right anterior limb of internal capsule. Vascular: No hyperdense vessel or unexpected calcification. Skull: Normal. Negative for fracture or focal lesion.  Sinuses/Orbits: Opacification of sphenoid and maxillary sinuses, partial opacification of the right posterior ethmoid air cells, and diffuse paranasal sinus mucosal thickening. There are chronic inflammatory changes of the walls of maxillary and sphenoid  sinuses and sinus secretions are increased in density indicating inspissation or fungal elements. Normal aeration of the mastoid air cells. Bilateral intra-ocular lens replacement. Other: Anasarca of subcutaneous fat in the posterior neck. IMPRESSION: 1. No acute intracranial abnormality identified. 2. Stable chronic microvascular ischemic changes and volume loss of the brain. 3. Stable extensive paranasal sinus disease with chronic inflammatory changes in the maxillary and sphenoid sinuses. Electronically Signed   By: Mitzi Hansen M.D.   On: 08/01/2018 17:59   Ct Head Wo Contrast  Result Date: 07/25/2018 CLINICAL DATA:  52 year old female with altered level of consciousness. EXAM: CT HEAD WITHOUT CONTRAST TECHNIQUE: Contiguous axial images were obtained from the base of the skull through the vertex without intravenous contrast. COMPARISON:  07/19/2018 FINDINGS: Motif shin artifact decreases sensitivity despite rescanning. Brain: No evidence of acute infarction, hemorrhage, hydrocephalus, extra-axial collection or mass lesion/mass effect. Mild atrophy and chronic small-vessel white matter ischemic changes again noted. Vascular: Vertebral and carotid atherosclerotic calcifications noted. Skull: No acute abnormality. Sinuses/Orbits: No acute abnormality. Mucoperiosteal thickening of the paranasal sinuses again noted. Other: None IMPRESSION: 1. No evidence of acute intracranial abnormality 2. Mild atrophy and chronic small-vessel white matter ischemic changes 3. Chronic paranasal sinus disease. Electronically Signed   By: Harmon Pier M.D.   On: 07/25/2018 12:01   Dg Chest Port 1 View  Result Date: 08/02/2018 CLINICAL DATA:  53 y/o  F; encounter  for central line placement. EXAM: PORTABLE CHEST 1 VIEW COMPARISON:  07/30/2018 chest radiograph. FINDINGS: Near complete opacification of left hemithorax. Left central venous catheter tip projects over the right atrium. Cardiac silhouette is obscured by left hemithorax opacification. Stable hazy opacification at the right lung base. Calcific aortic atherosclerosis. No acute osseous abnormality is evident. IMPRESSION: 1. Left central venous catheter tip projects over the right atrium. 2. Progressed near complete opacification of left hemithorax. Stable hazy opacification of the right lung base. Electronically Signed   By: Mitzi Hansen M.D.   On: 08/02/2018 22:26   Dg Chest Port 1 View  Result Date: 07/30/2018 CLINICAL DATA:  Hypoxia EXAM: PORTABLE CHEST 1 VIEW COMPARISON:  07/20/2018 FINDINGS: Exam is rotated to the left. Increased left mid and lower lung mixed interstitial and airspace opacities concerning for developing left lung pneumonia or asymmetric edema. Right lung remains clear. Small left effusion not excluded. No large pneumothorax. Trachea is midline. Aorta atherosclerotic. Bones are osteopenic. IMPRESSION: Increased left mid and lower lung mixed interstitial and airspace opacity with a small left pleural effusion concerning for asymmetric edema or pneumonia. Atherosclerosis Electronically Signed   By: Judie Petit.  Shick M.D.   On: 07/30/2018 13:27   [place] Ekg Site Rite  Result Date: 08/02/2018 If Site Rite image not attached, placement could not be confirmed due to current cardiac rhythm.  [place] Ekg Site Rite  Result Date: 07/28/2018 If Site Rite image not attached, placement could not be confirmed due to current cardiac rhythm.   Microbiology No results found for this or any previous visit (from the past 240 hour(s)).  Lab Basic Metabolic Panel: Recent Labs  Lab 08/01/18 0748 08/02/18 0520 08/03/18 0320  NA 141 140 141  K 3.6 3.6 3.2*  CL 111 111 113*  CO2 21* 20* 20*   GLUCOSE 174* 135* 83  BUN 24* 25* 25*  CREATININE 2.96* 2.89* 2.90*  CALCIUM 8.3* 8.5* 8.6*  PHOS 3.3 3.1 3.2   Liver Function Tests: Recent Labs  Lab 08/01/18 0748 08/02/18 0520 08/03/18 0320  ALBUMIN 1.3* 1.3* 1.3*  No results for input(s): LIPASE, AMYLASE in the last 168 hours. Recent Labs  Lab 08/01/18 1306  AMMONIA 37*   CBC: Recent Labs  Lab 08/02/18 0520 08/03/18 0807  WBC 11.7* 10.3  HGB 8.2* 8.0*  HCT 27.2* 27.0*  MCV 88.9 90.3  PLT 107* 92*   Cardiac Enzymes: No results for input(s): CKTOTAL, CKMB, CKMBINDEX, TROPONINI in the last 168 hours. Sepsis Labs: Recent Labs  Lab 08/02/18 0520 08/03/18 0807  WBC 11.7* 10.3    Procedures/Operations     Zacharia Sowles 08/07/2018, 3:11 PM

## 2018-10-20 IMAGING — CR DG CHEST 1V PORT
1 series · 1 of 1 positions shown · non-contrast
Comparison: Chest radiograph dated 10/12/2016

CLINICAL DATA: 50-year-old female with diabetic ketoacidosis.

EXAM:
PORTABLE CHEST 1 VIEW

[AP]
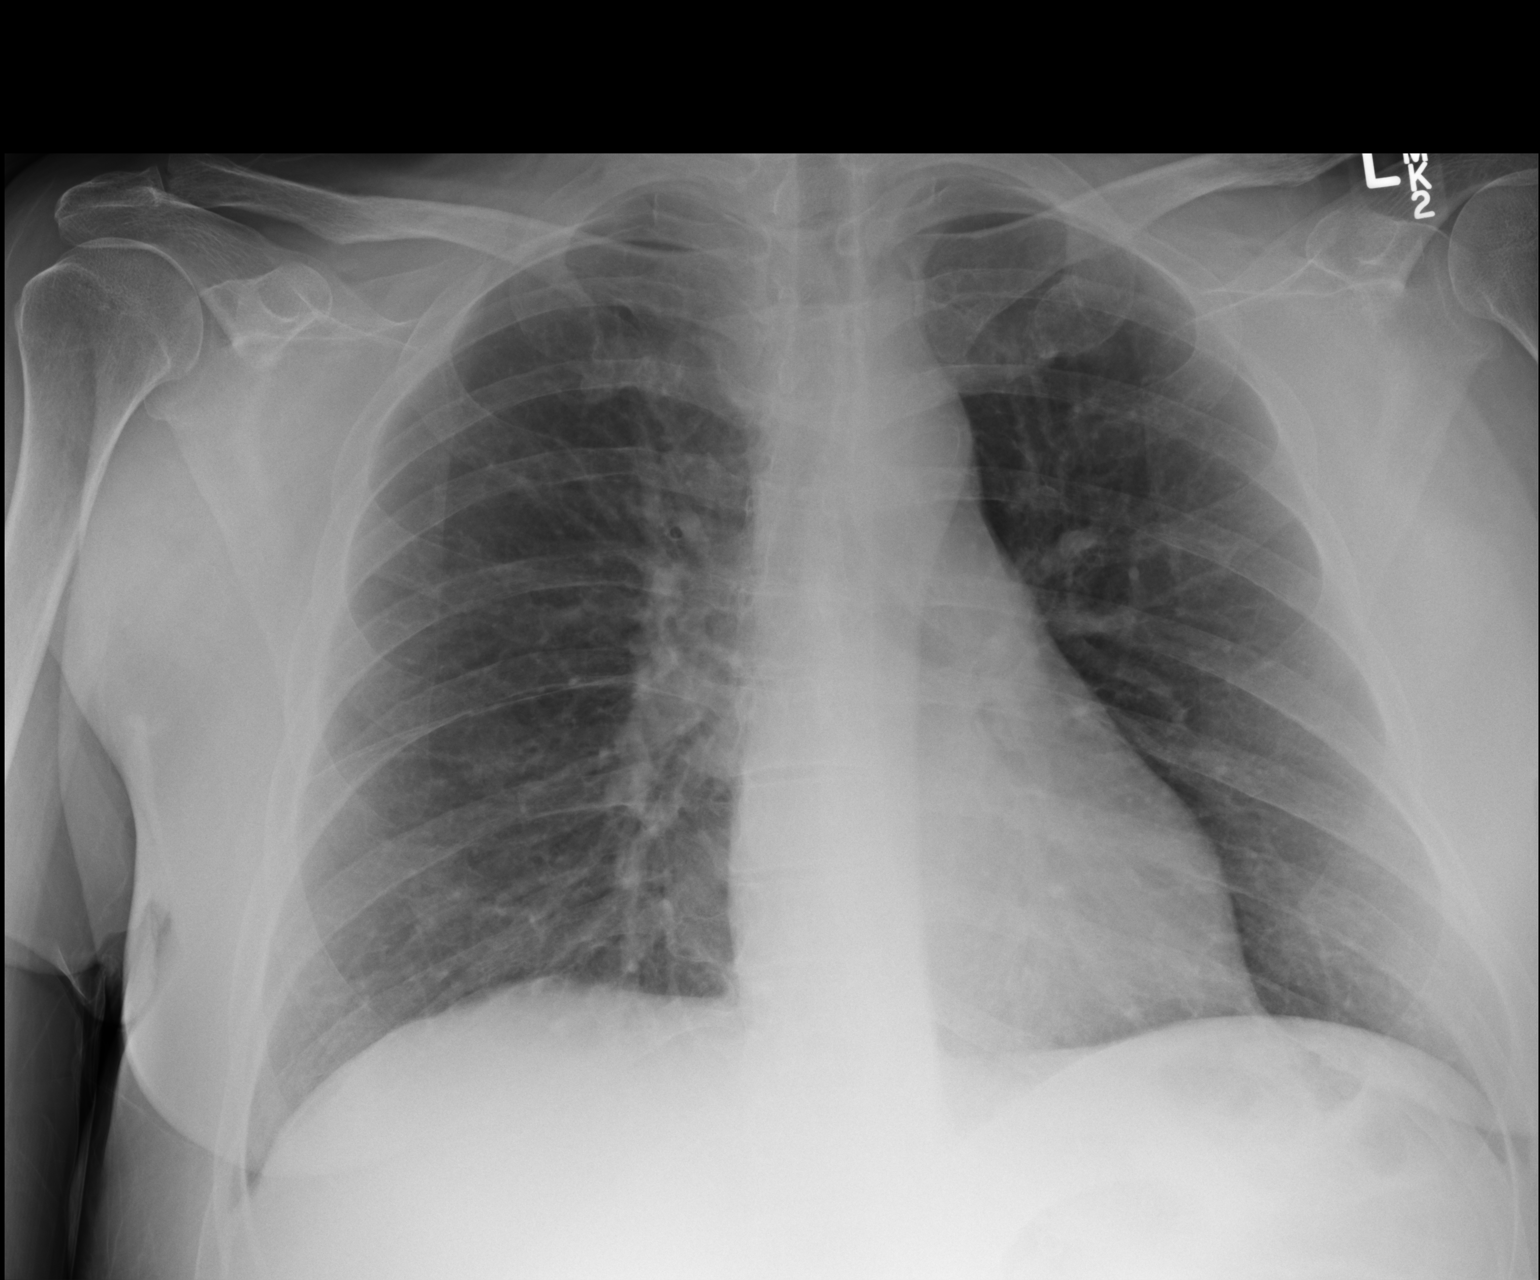

[1 of 1 positions shown; findings below may reference images not displayed]

FINDINGS: The heart size and mediastinal contours are within normal limits.
Both lungs are clear. The visualized skeletal structures are
unremarkable.
IMPRESSION: No active disease.

## 2018-11-14 IMAGING — CR DG CHEST 2V
2 series · 2 of 2 positions shown · non-contrast
Comparison: 01/01/2017

CLINICAL DATA: Chest pain

EXAM:
CHEST  2 VIEW

[w chest lat]
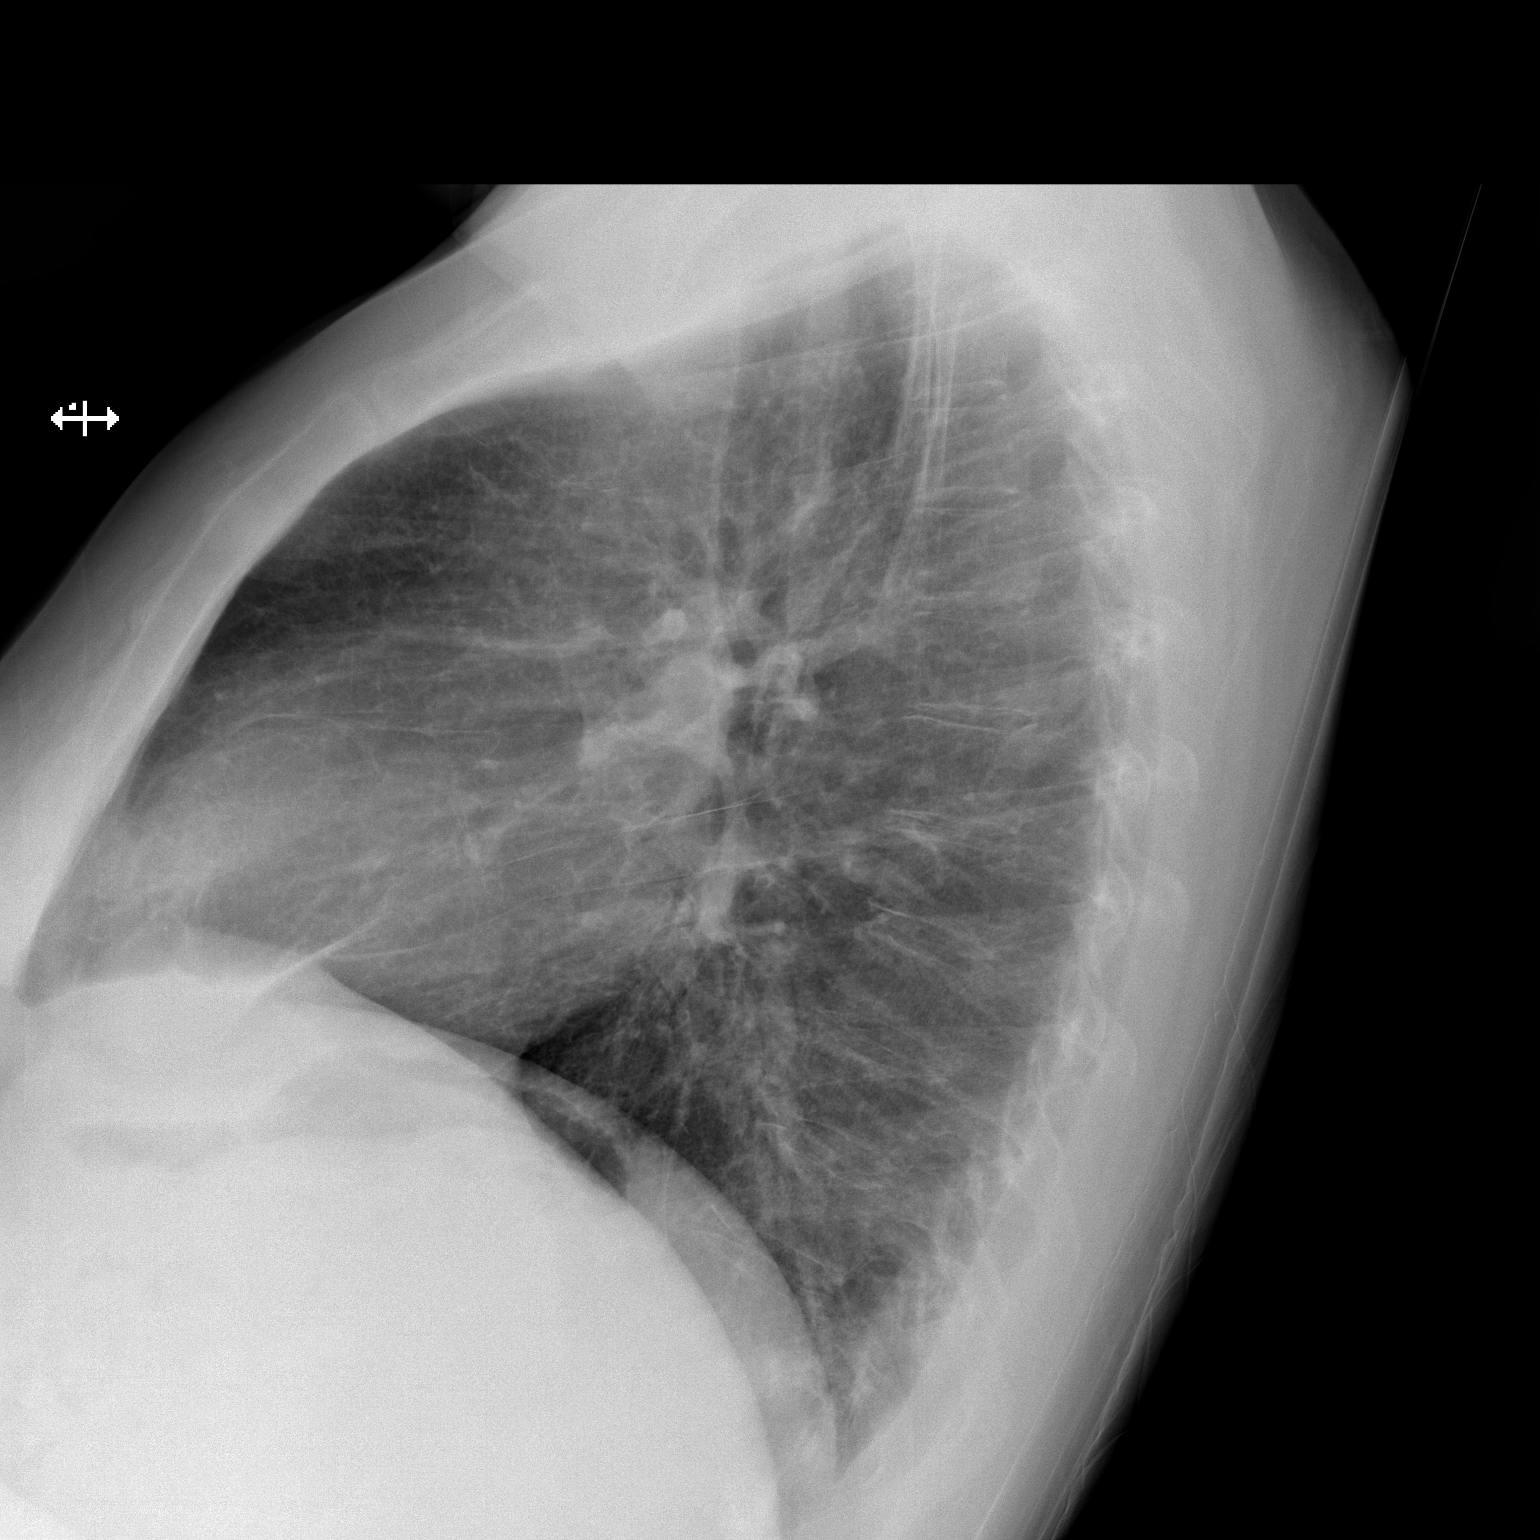

[x chest ap]
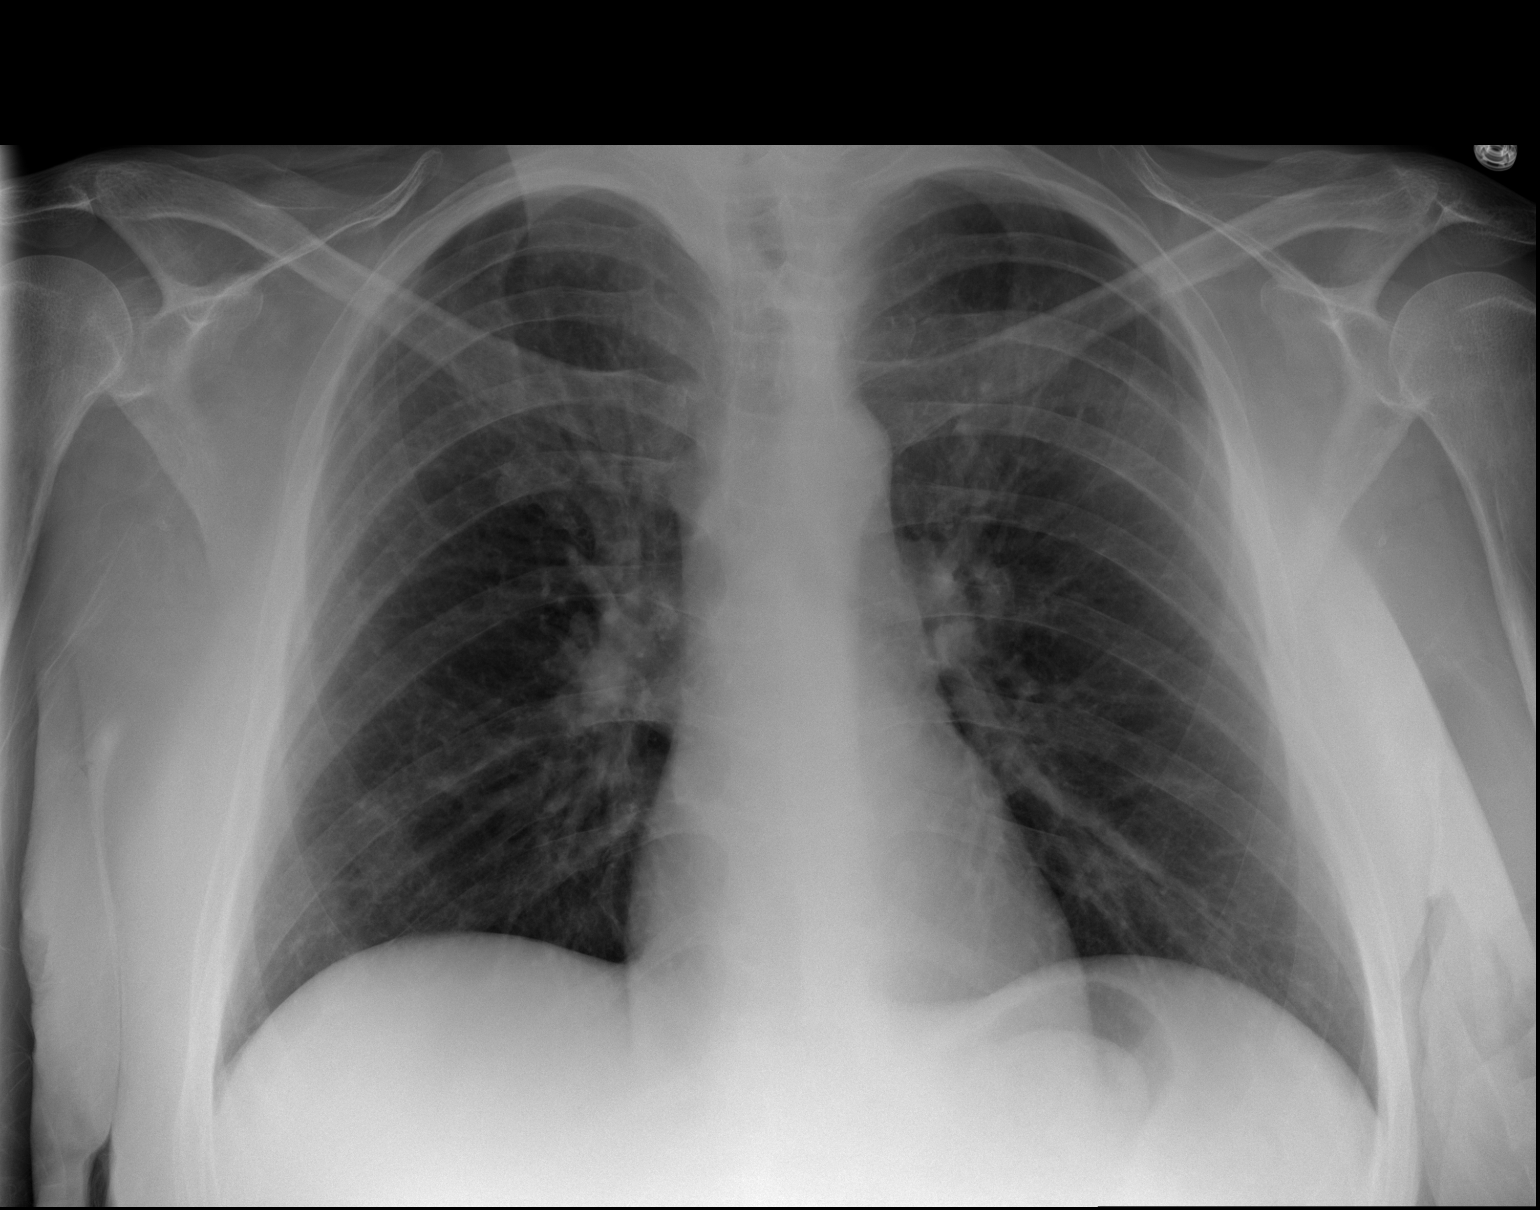

[2 of 2 positions shown; findings below may reference images not displayed]

FINDINGS: The heart size and mediastinal contours are within normal limits.
Both lungs are clear. The visualized skeletal structures are
unremarkable.
IMPRESSION: No active cardiopulmonary disease.

## 2018-12-18 IMAGING — DX DG CHEST 1V PORT
1 series · 1 of 1 positions shown · non-contrast
Comparison: Portable exam 0780 hours compared to 01/26/2017

CLINICAL DATA: DKA, hypertension, diabetes mellitus

EXAM:
PORTABLE CHEST 1 VIEW

[chest ap]
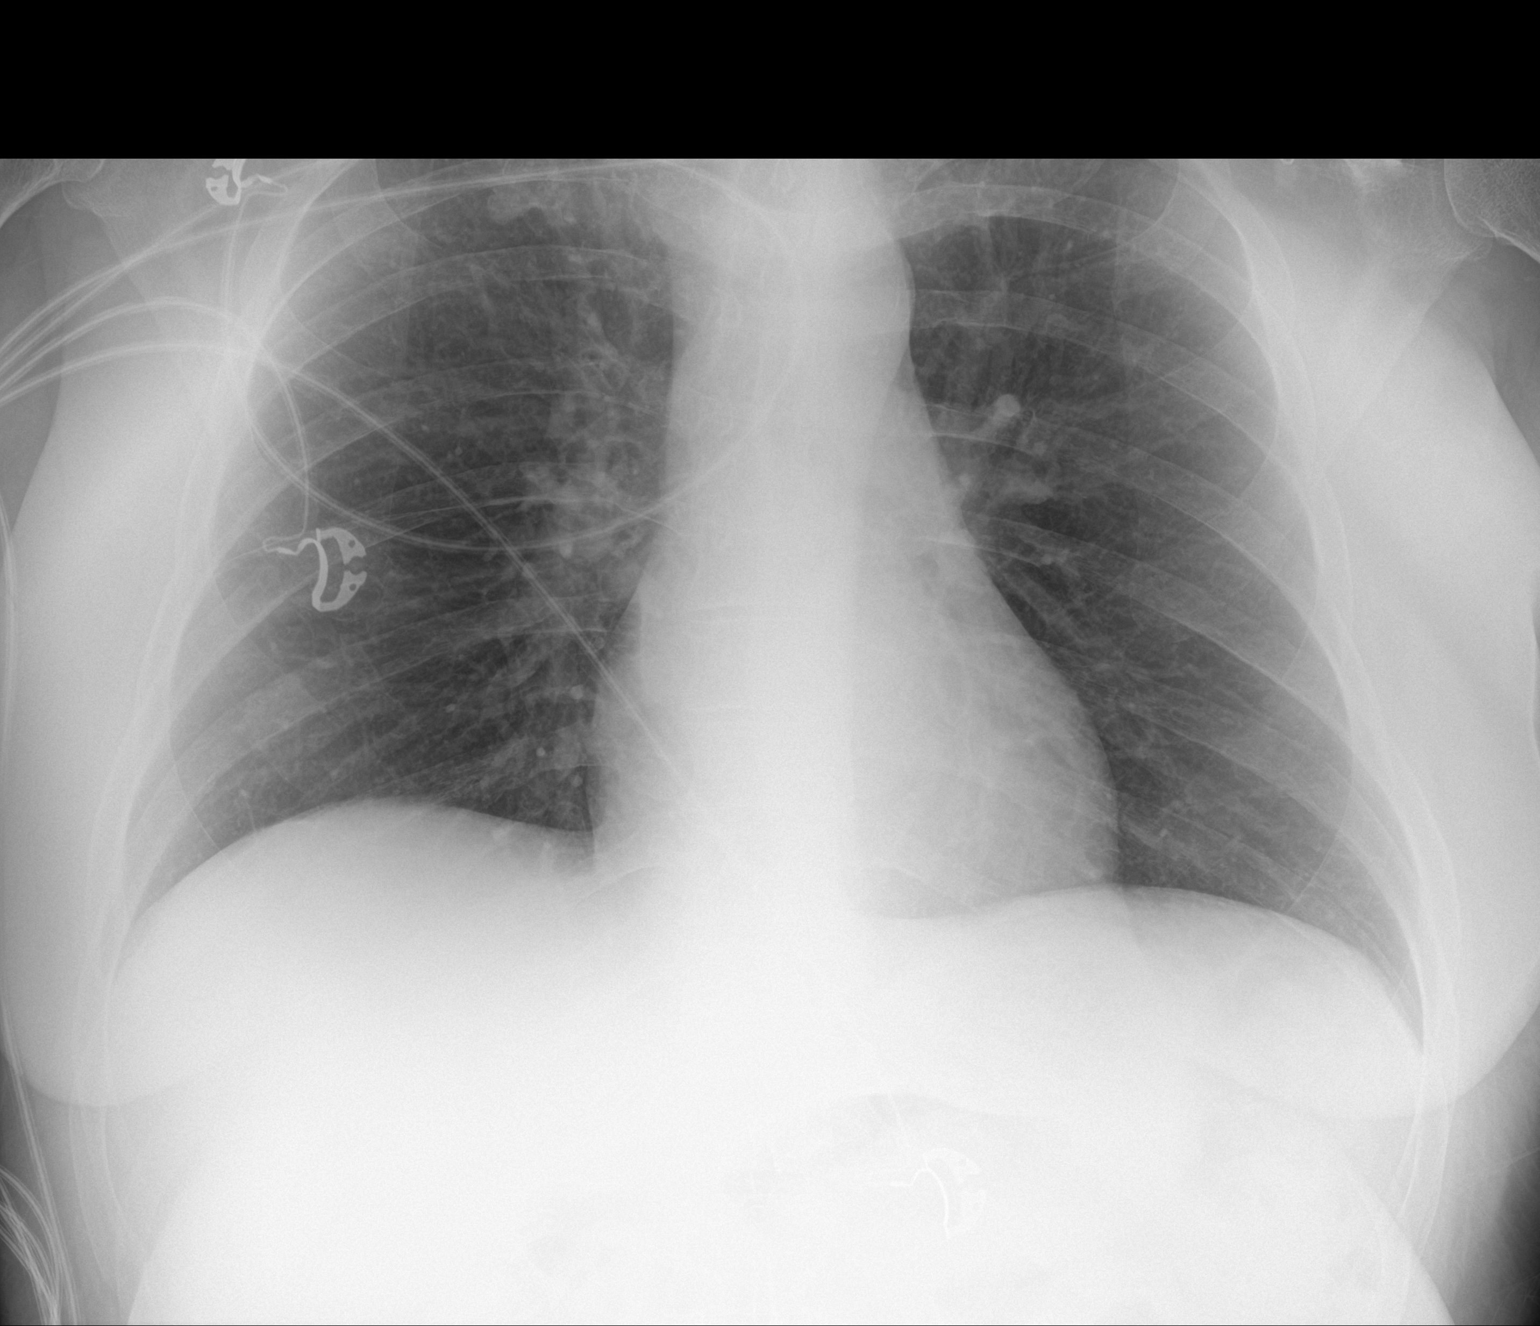

[1 of 1 positions shown; findings below may reference images not displayed]

FINDINGS: Normal heart size, mediastinal contours, and pulmonary vascularity.

Atherosclerotic calcification aorta.

Lungs clear.

No pleural effusion or pneumothorax.

Bones unremarkable.
IMPRESSION: No acute abnormalities.

Aortic Atherosclerosis ([id number]).

## 2019-01-09 IMAGING — CR DG CHEST 2V
2 series · 2 of 2 positions shown · non-contrast
Comparison: 03/01/2017

CLINICAL DATA: Shortness of breath

EXAM:
CHEST  2 VIEW

[w chest lat]
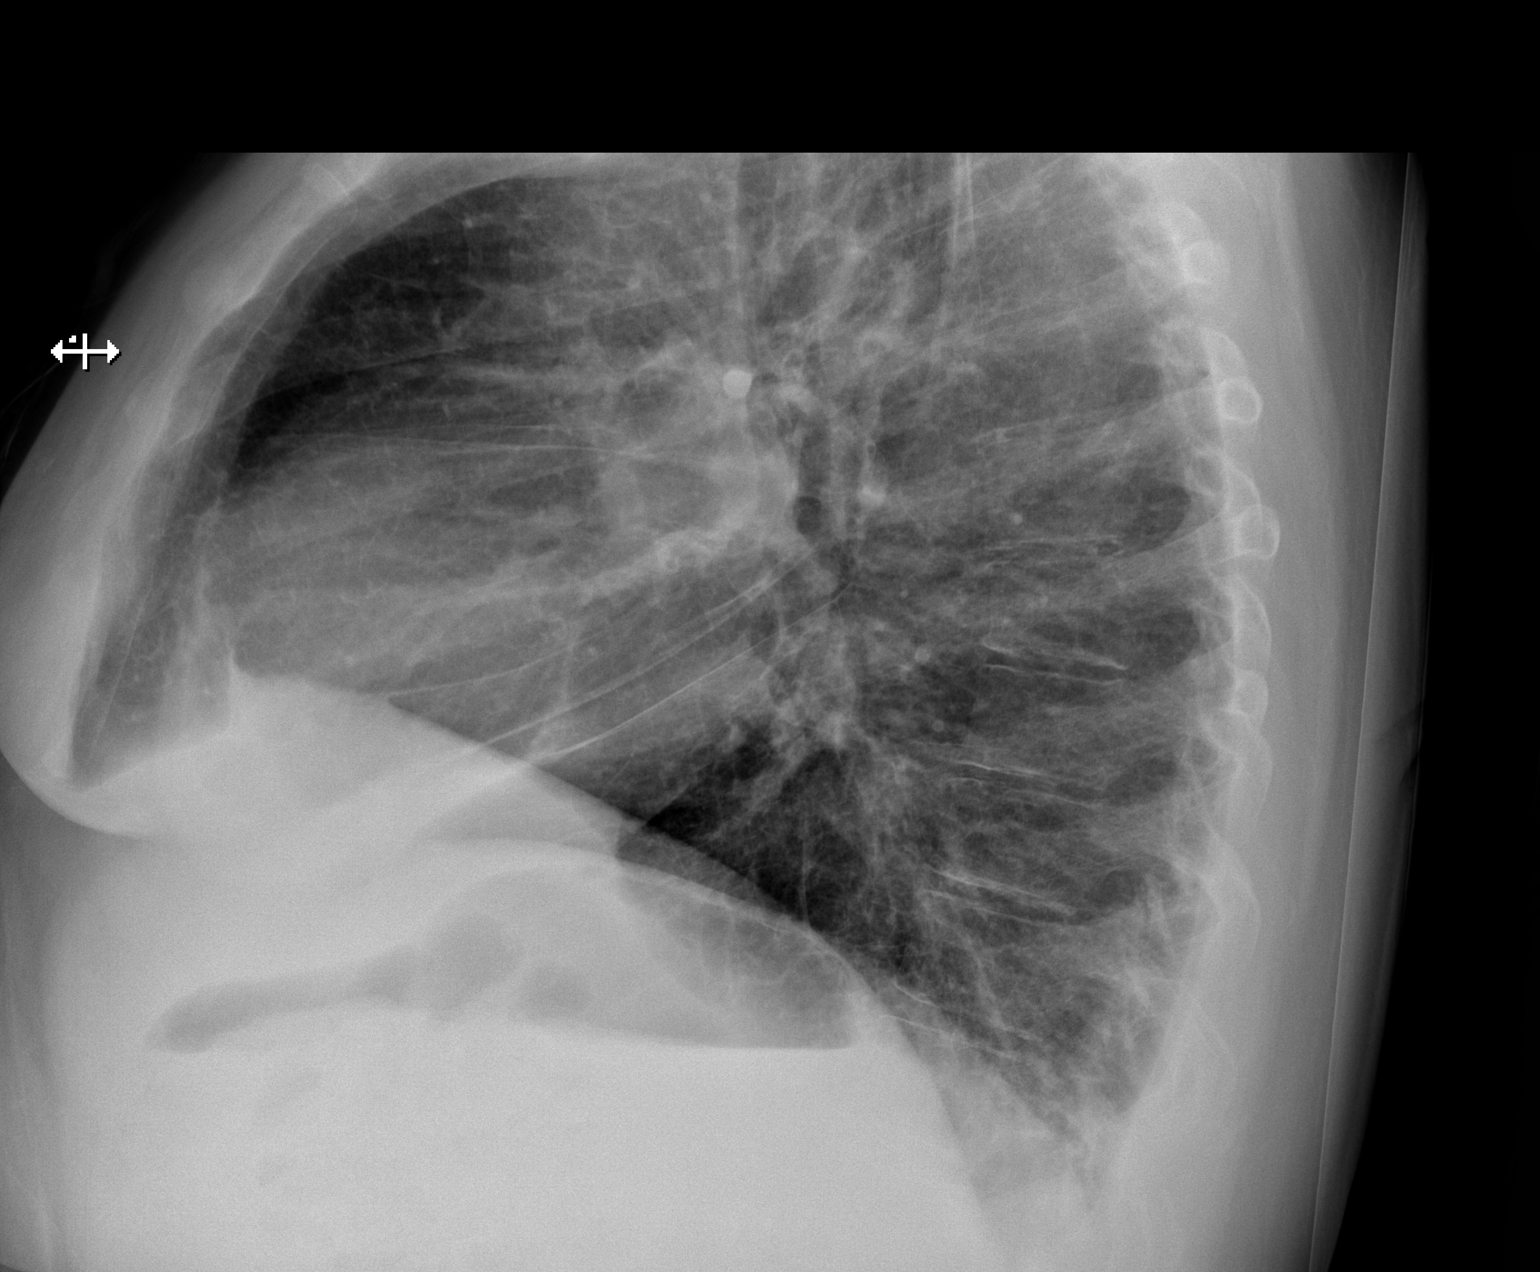

[x chest ap]
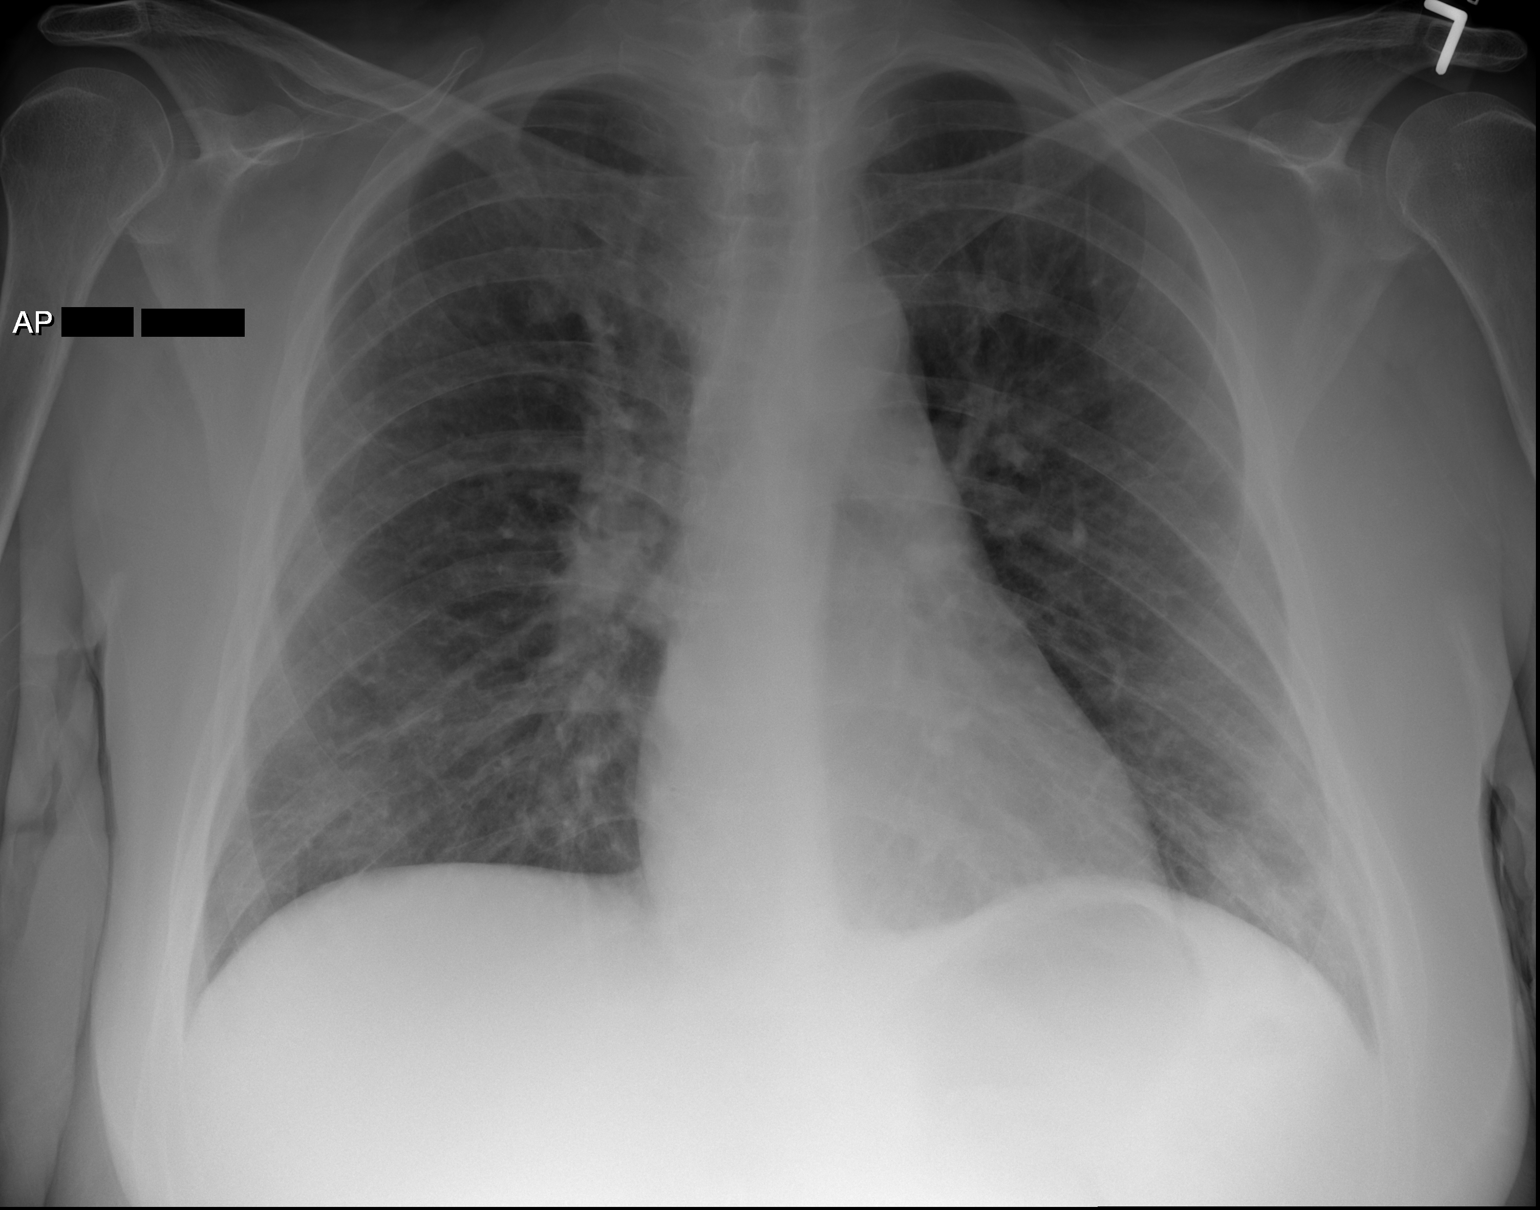

[2 of 2 positions shown; findings below may reference images not displayed]

FINDINGS: Interstitial coarsening that is favored bronchitic. No Kerley lines
or air bronchogram. No effusion or pneumothorax. Normal heart size
and aortic contours.
IMPRESSION: Bronchitic type markings without collapse or consolidation.

## 2019-01-12 IMAGING — DX DG HIP (WITH OR WITHOUT PELVIS) 2-3V*R*
3 series · 3 of 3 positions shown · non-contrast
Comparison: None.

CLINICAL DATA: Acute on chronic right hip pain.

EXAM:
DG HIP (WITH OR WITHOUT PELVIS) 2-3V RIGHT

[pelvis ap]
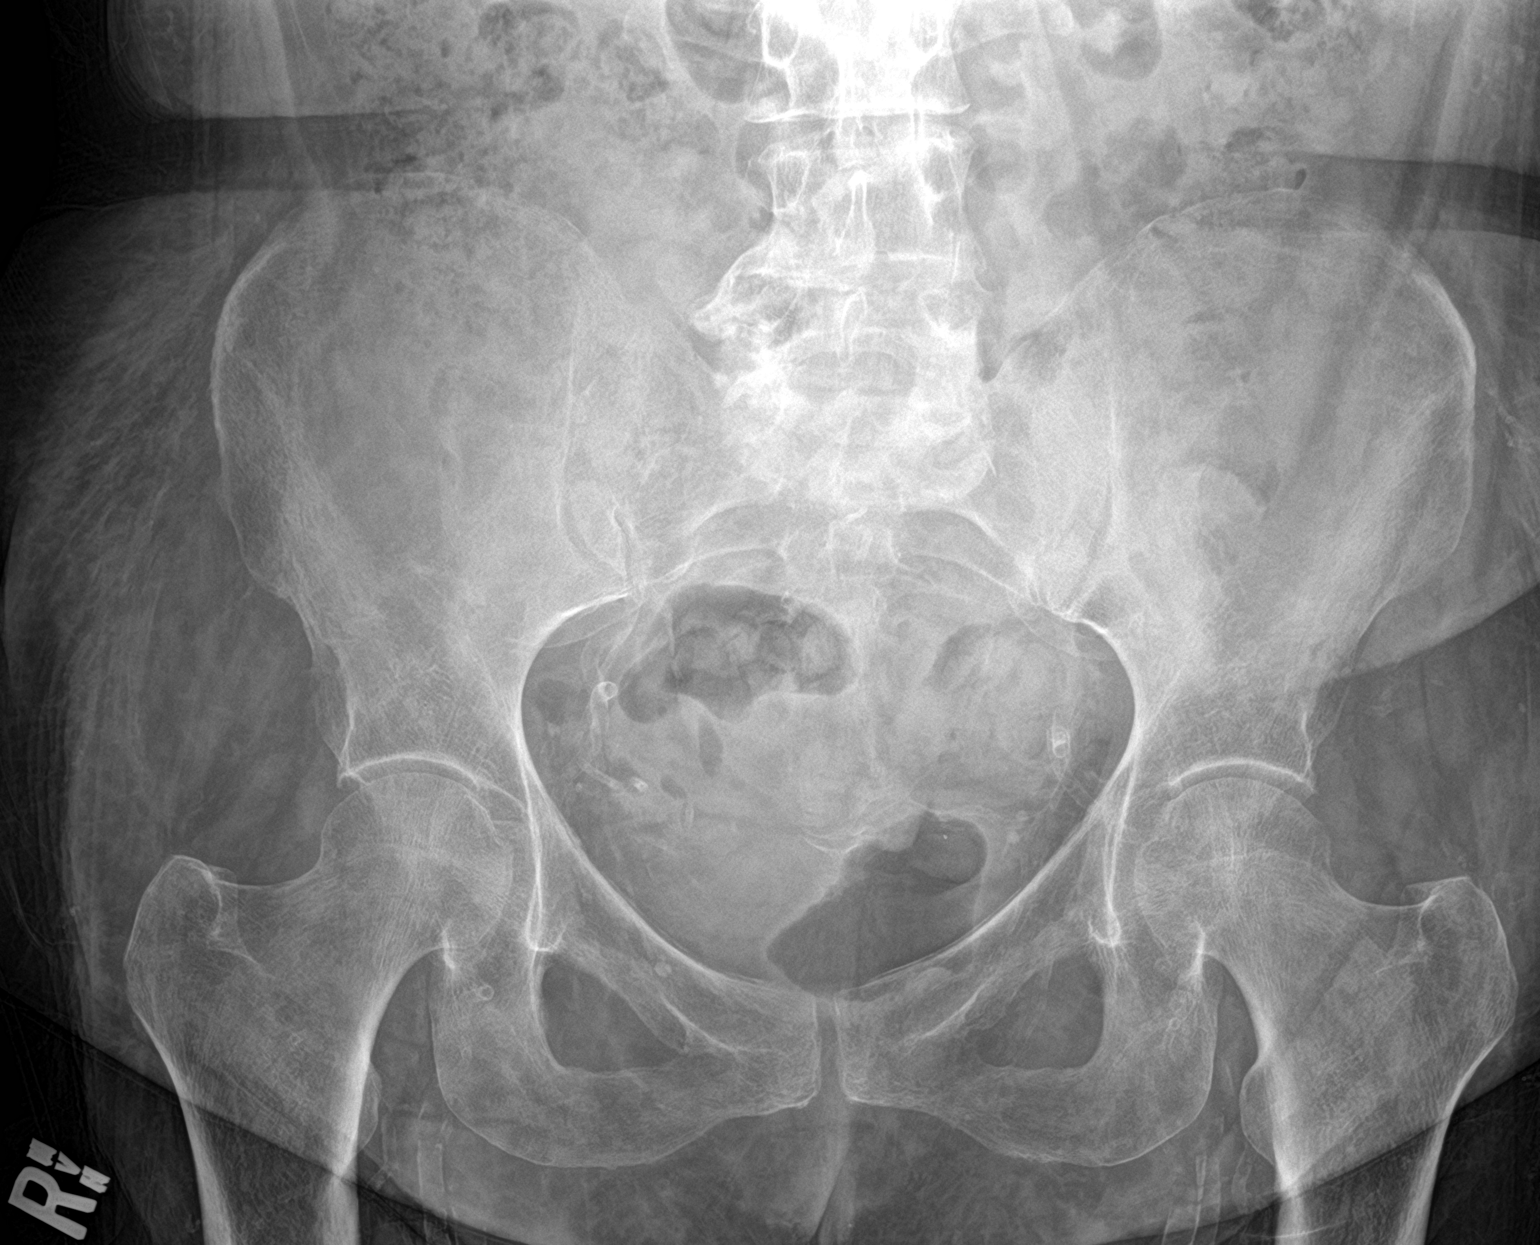

[hip ap]
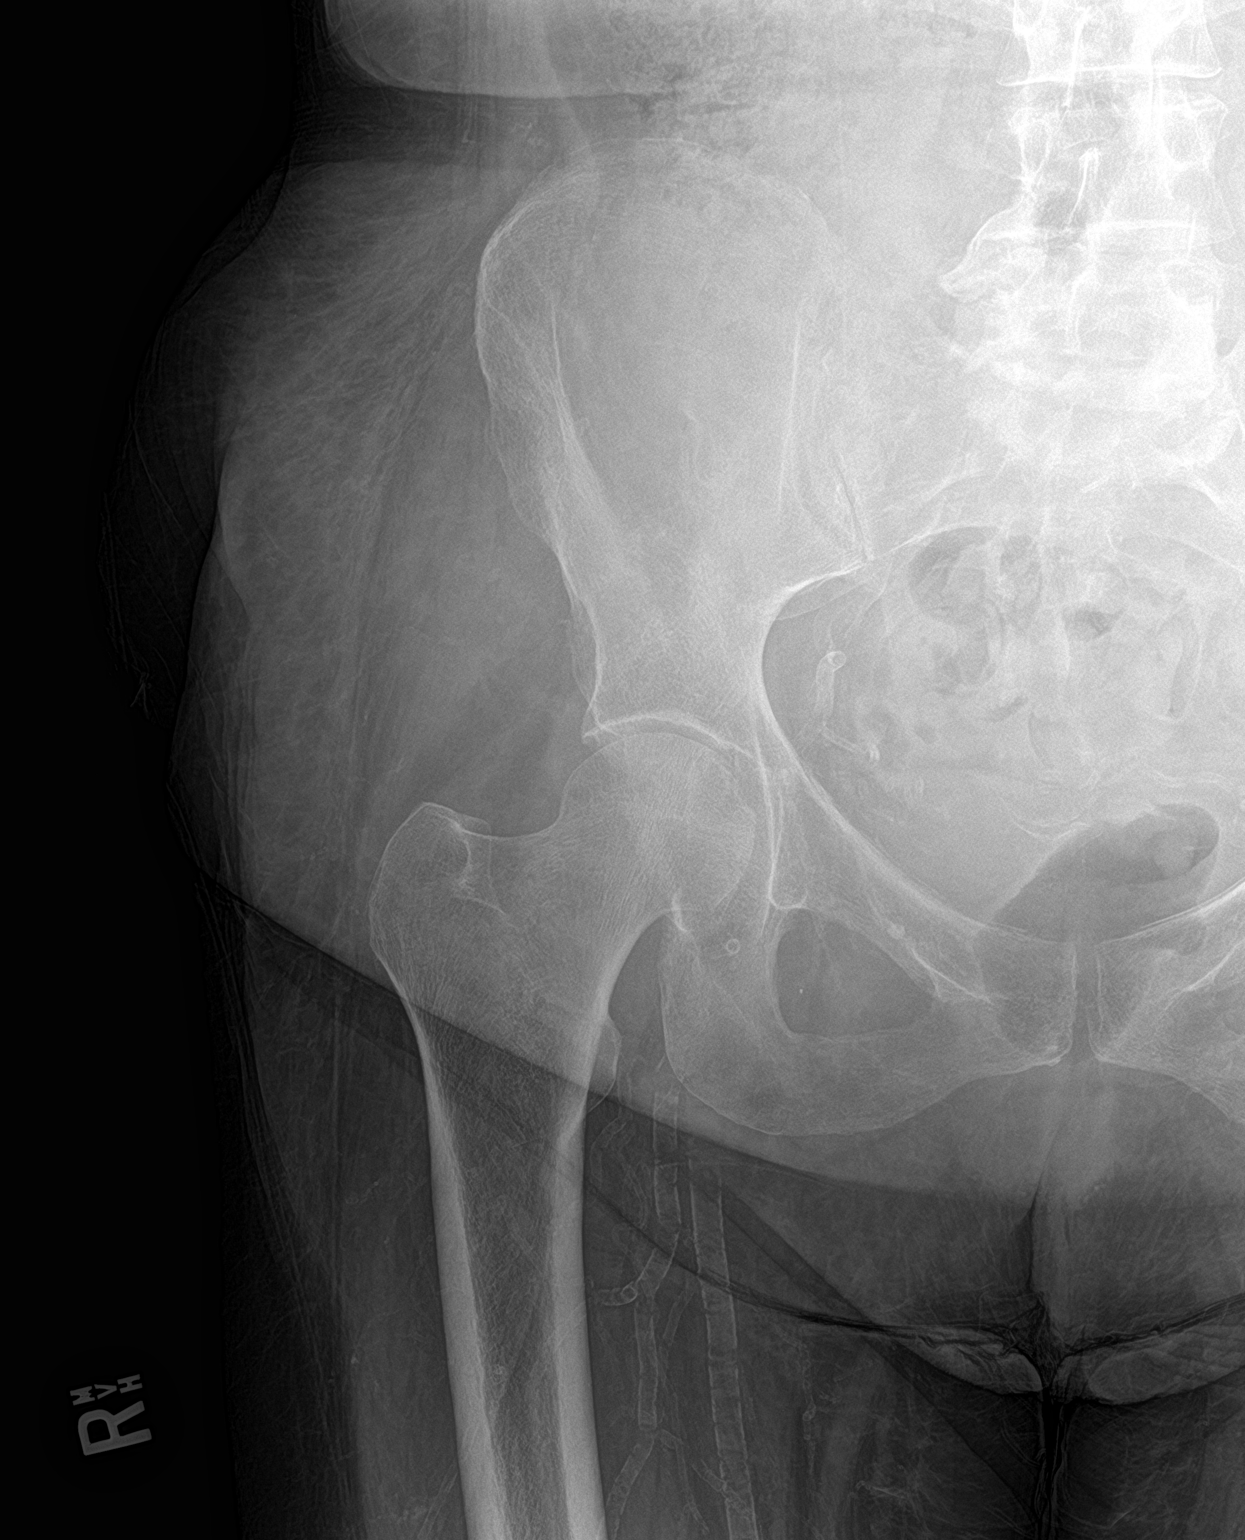

[hip lat]
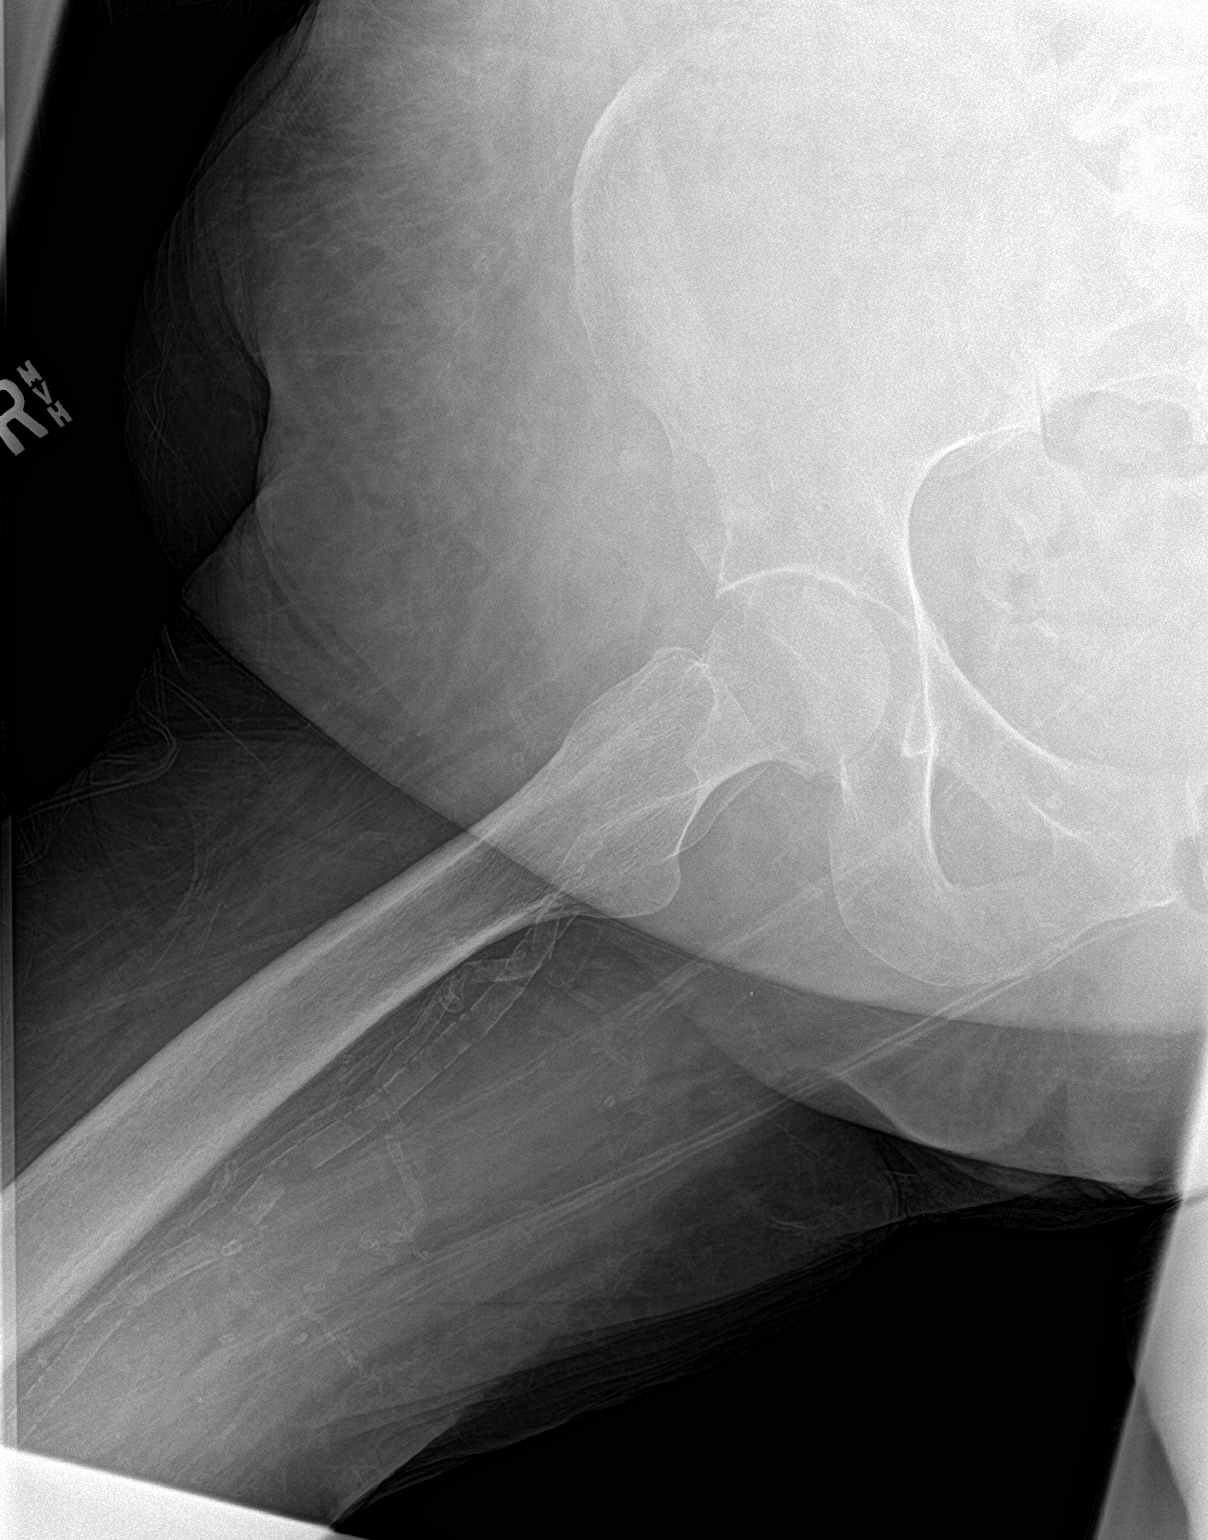

[3 of 3 positions shown; findings below may reference images not displayed]

FINDINGS: Bones are diffusely demineralized. SI joints and symphysis pubis are
unremarkable. Joint space in the hips is relatively well preserved
and symmetric. AP and frog-leg lateral views of the right hip show
no evidence for a femoral neck fracture.
IMPRESSION: Negative.

## 2019-01-23 IMAGING — CR DG CHEST 2V
2 series · 2 of 2 positions shown · non-contrast
Comparison: 03/23/2017

CLINICAL DATA: Bilateral lower extremity swelling.  Diabetes.

EXAM:
CHEST  2 VIEW

[w chest lat]
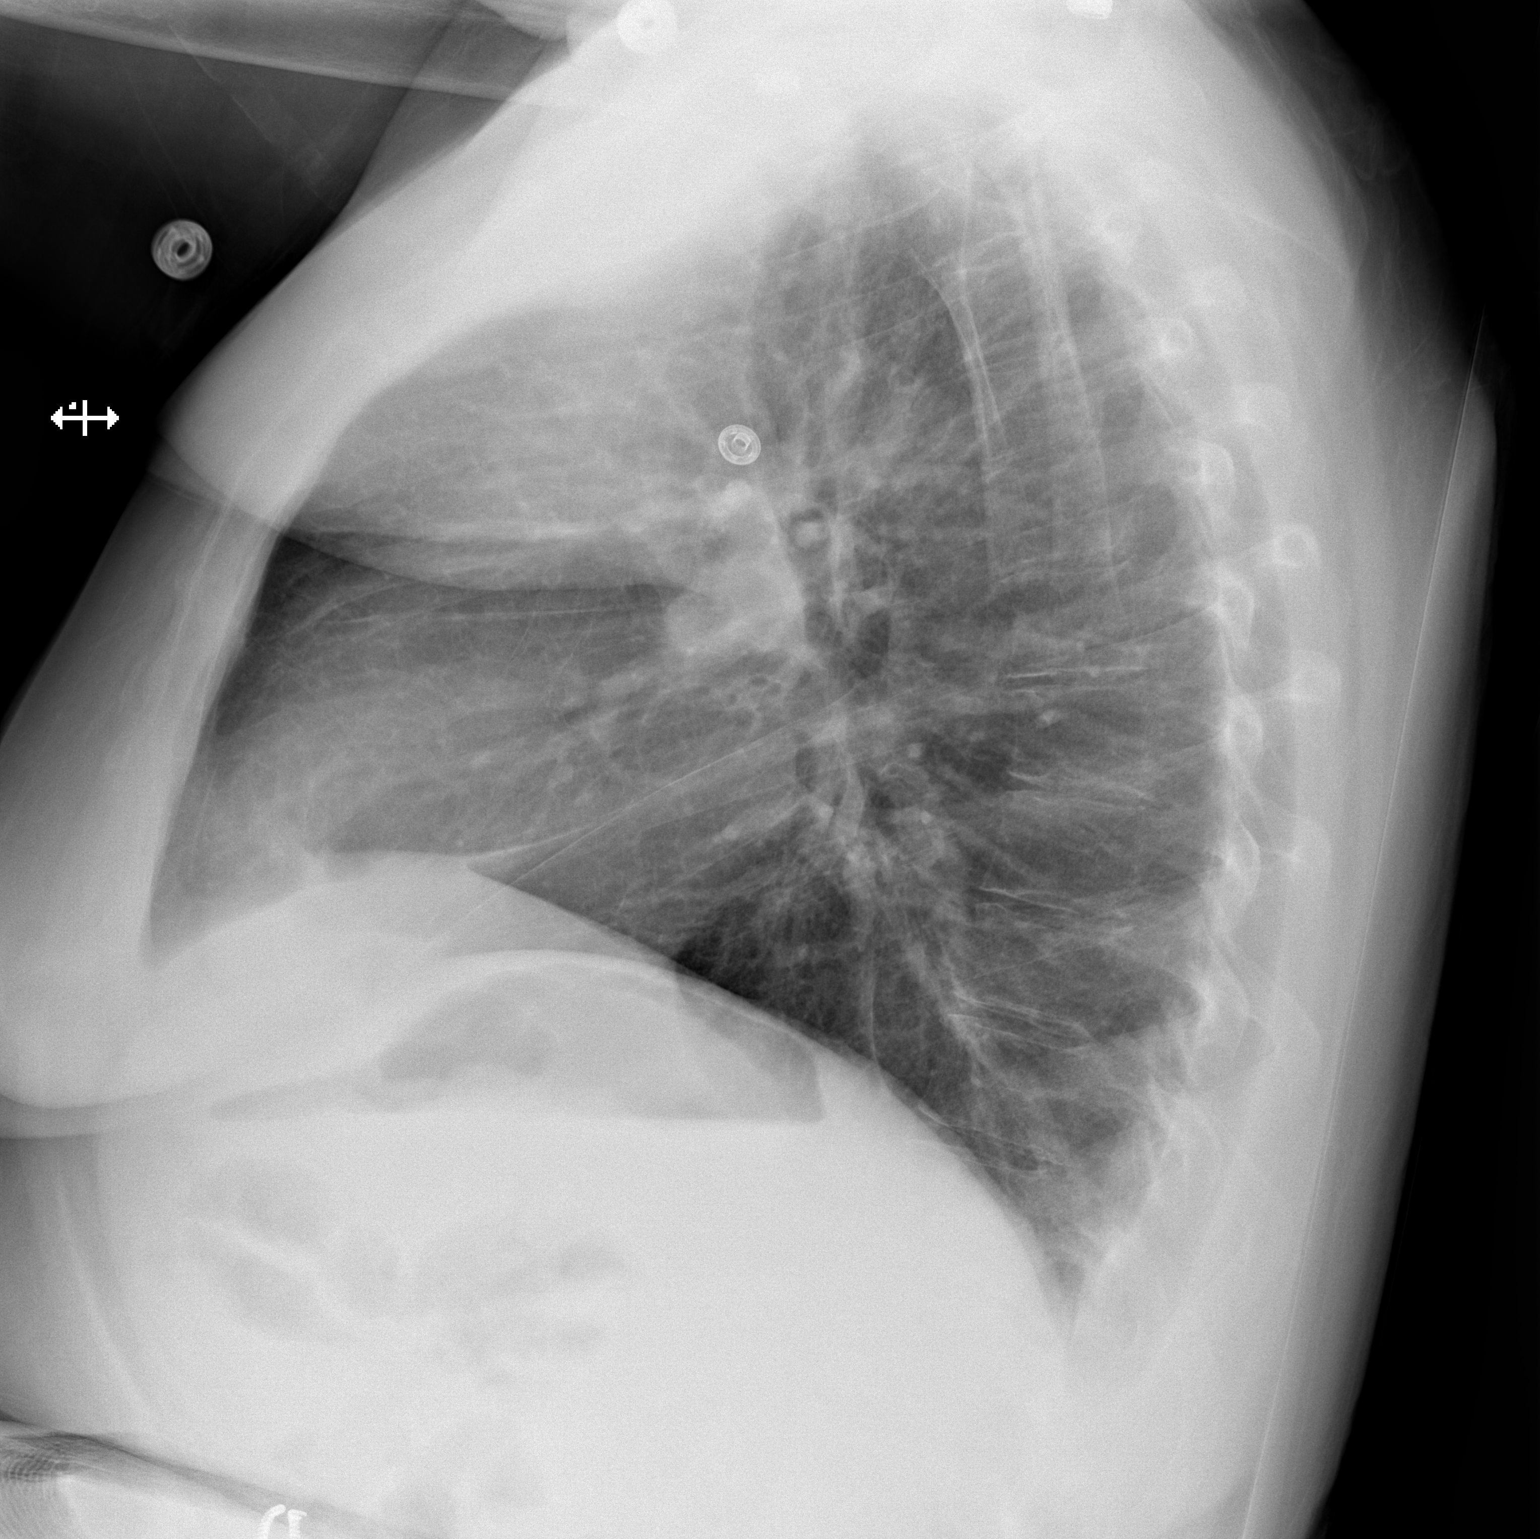

[x chest ap]
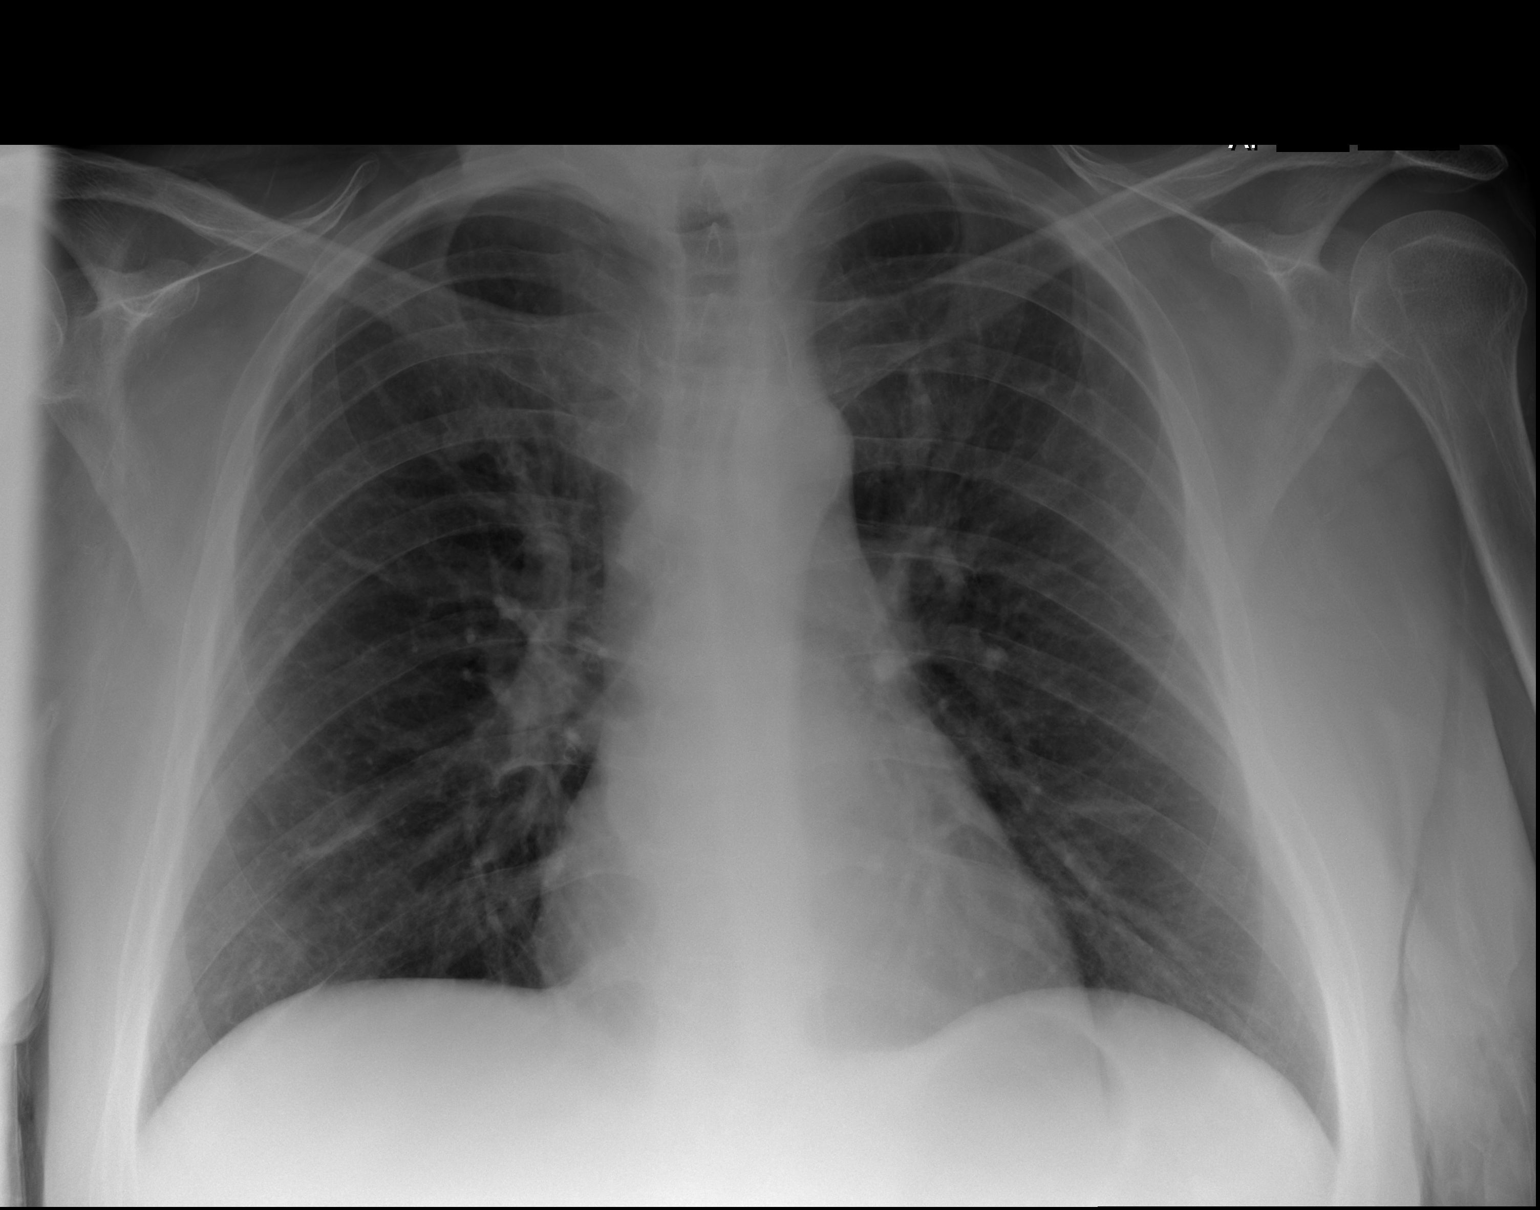

[2 of 2 positions shown; findings below may reference images not displayed]

FINDINGS: There is subsegmental atelectasis in both mid and lower lung zones.
Heart is normal in size. No pneumothorax. No pleural effusion.
IMPRESSION: Bibasilar subsegmental atelectasis.

## 2019-02-11 IMAGING — DX DG CHEST 1V PORT
1 series · 1 of 1 positions shown · non-contrast
Comparison: 04/06/2017, 03/23/2017

CLINICAL DATA: 51-year-old female with a history of shortness of
breath

EXAM:
PORTABLE CHEST 1 VIEW

[chest ap]
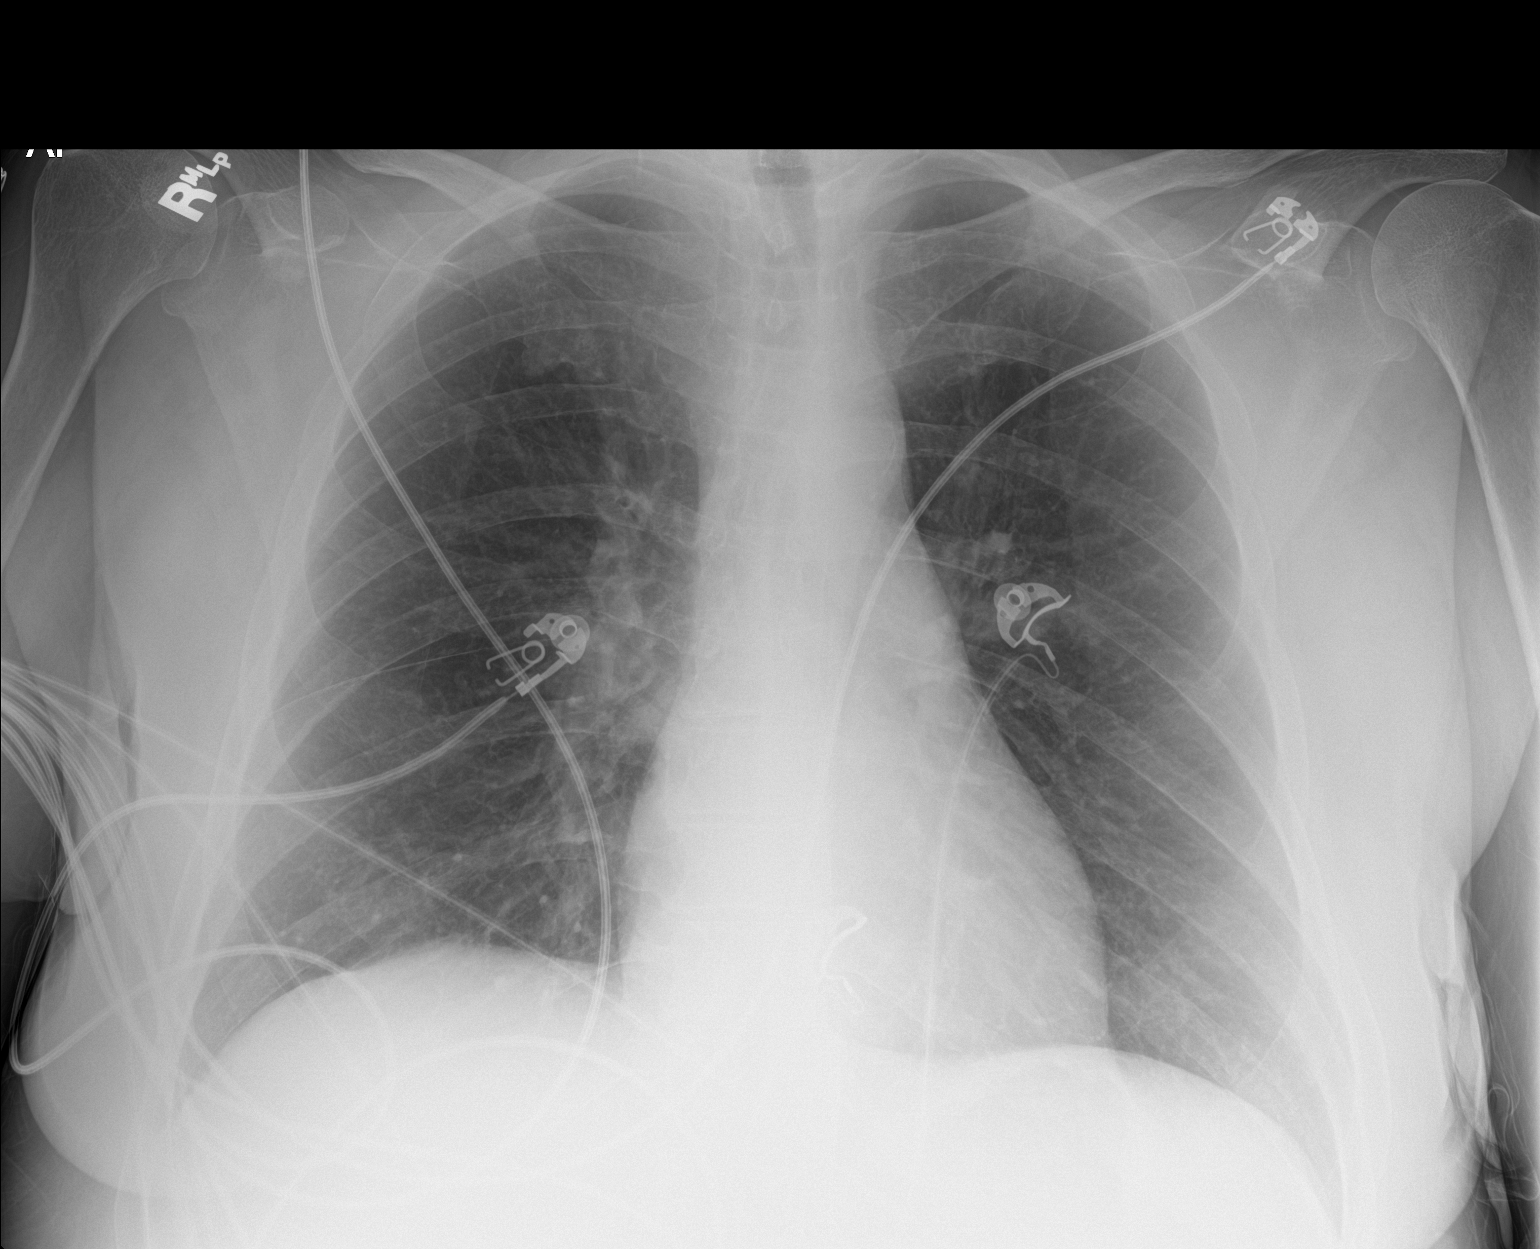

[1 of 1 positions shown; findings below may reference images not displayed]

FINDINGS: Cardiomediastinal silhouette within normal limits.

No evidence of central vascular congestion.

No pneumothorax or pleural effusion.  No confluent airspace disease.

No displaced fracture.
IMPRESSION: No radiographic evidence of acute cardiopulmonary disease.

## 2019-02-25 IMAGING — DX DG FOOT COMPLETE 3+V*L*
3 series · 3 of 3 positions shown · non-contrast
Comparison: 05/03/2014

CLINICAL DATA: Swelling, second toe.  Diabetes.

EXAM:
LEFT FOOT - COMPLETE 3+ VIEW

[foot obl]
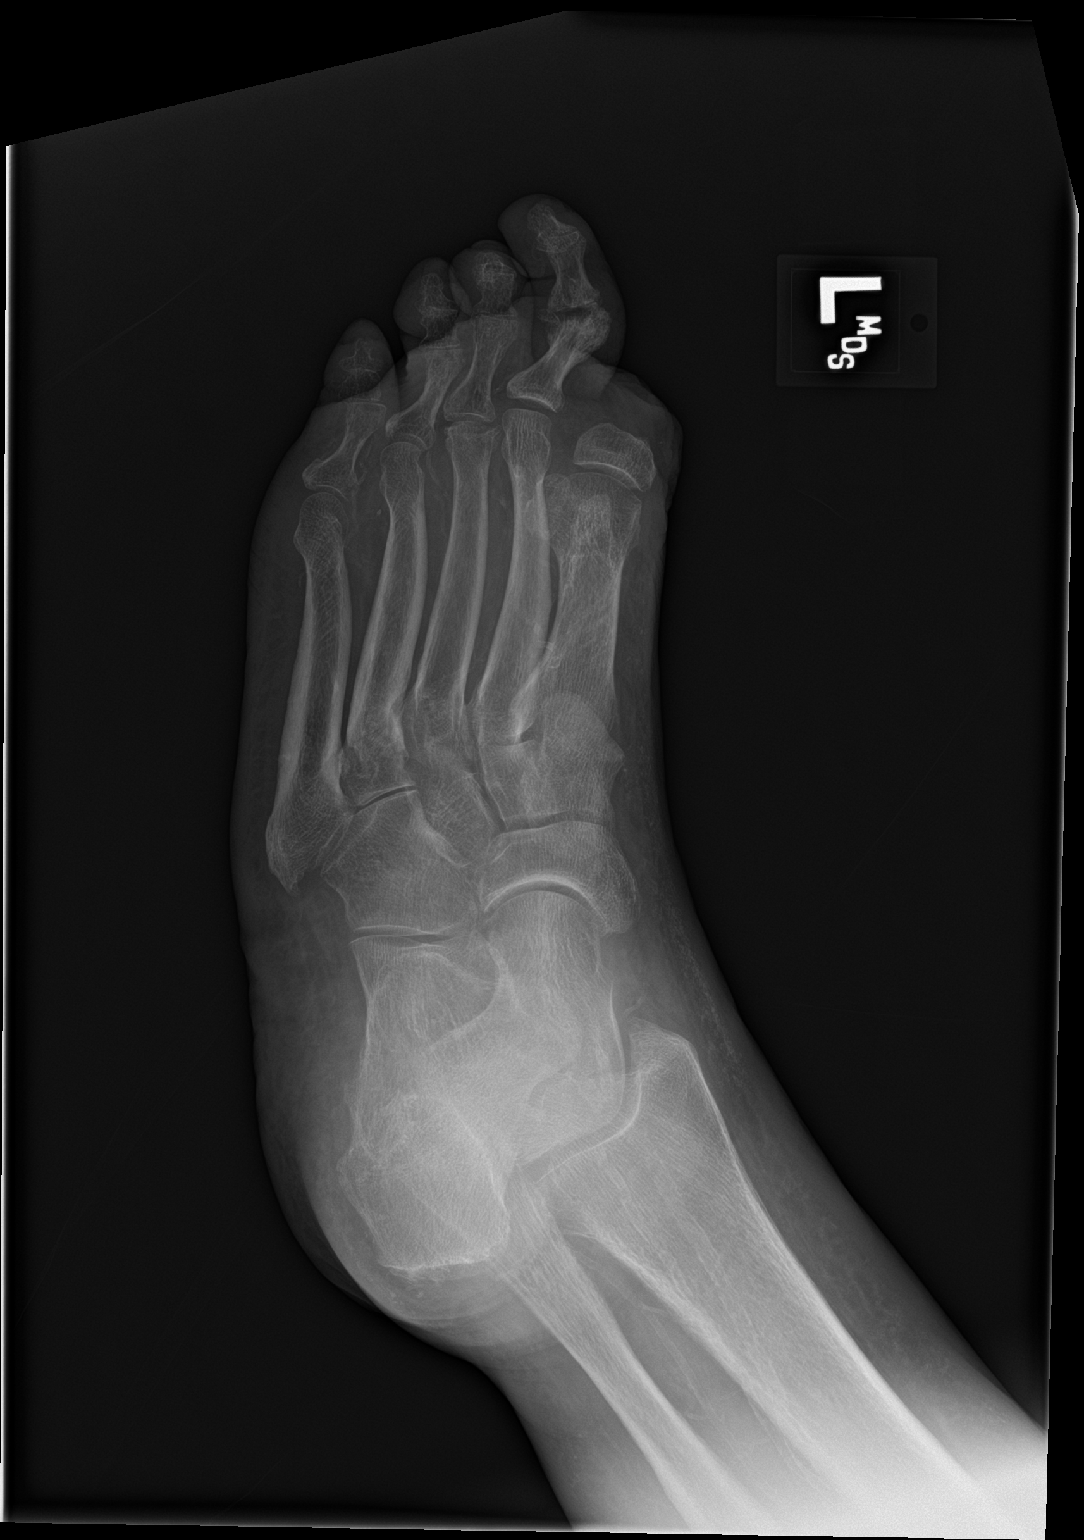

[foot lat]
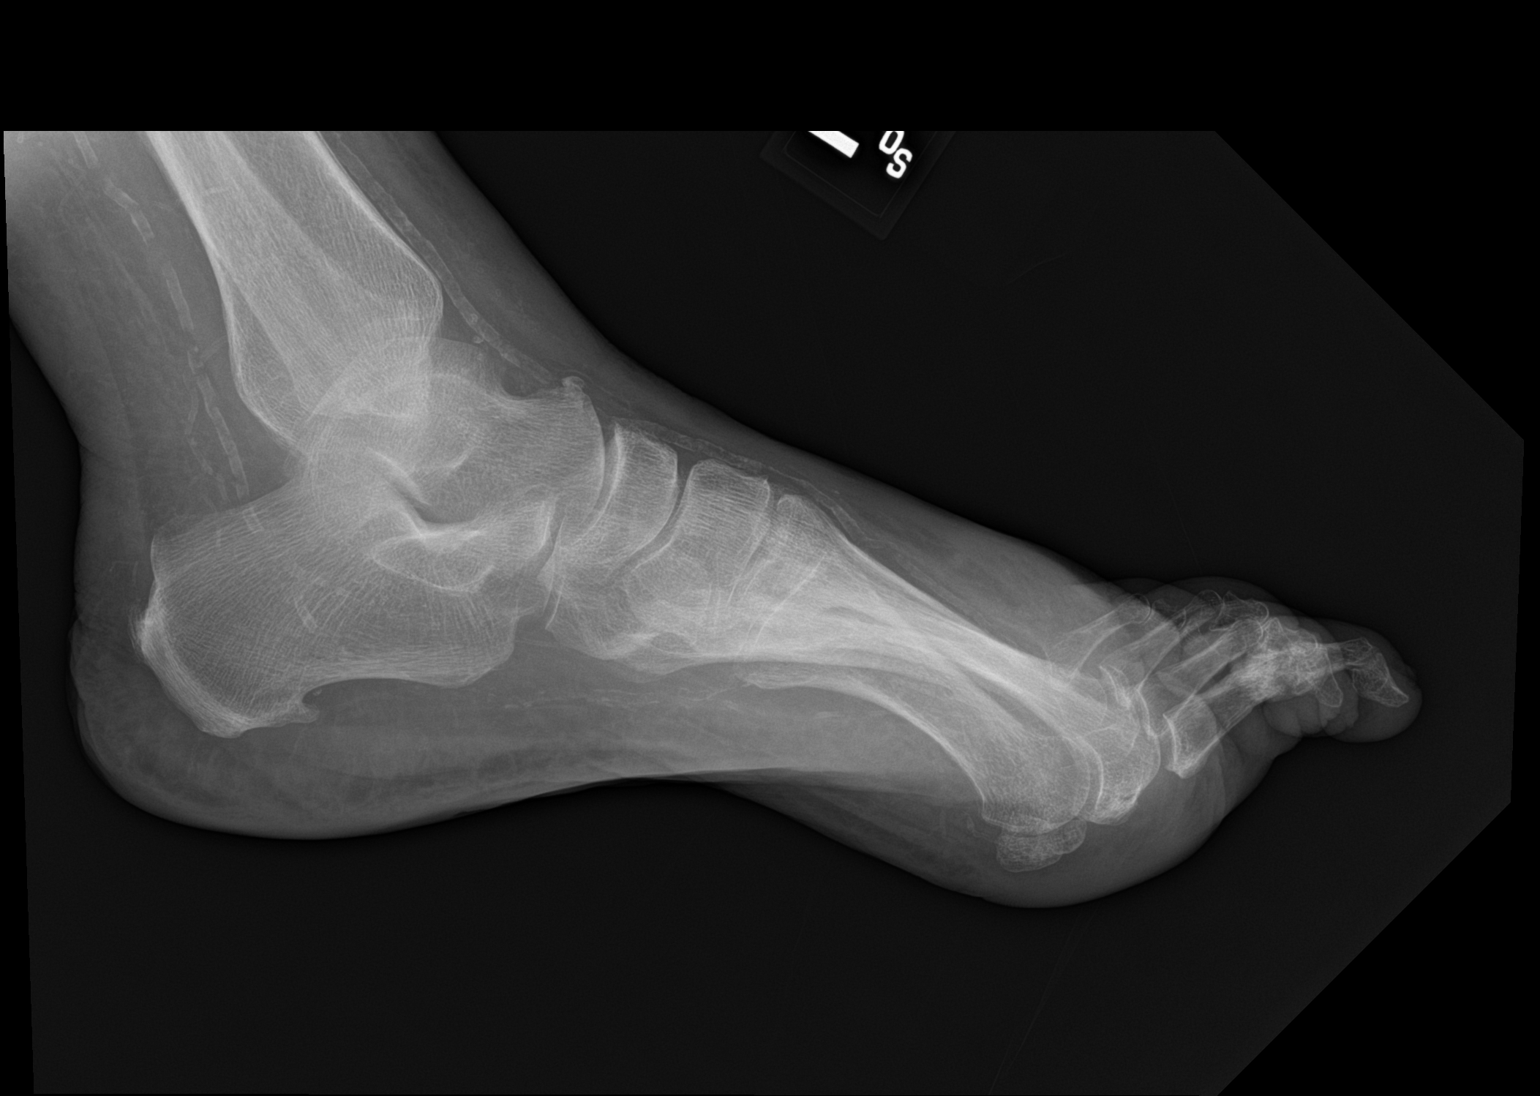

[foot ap]
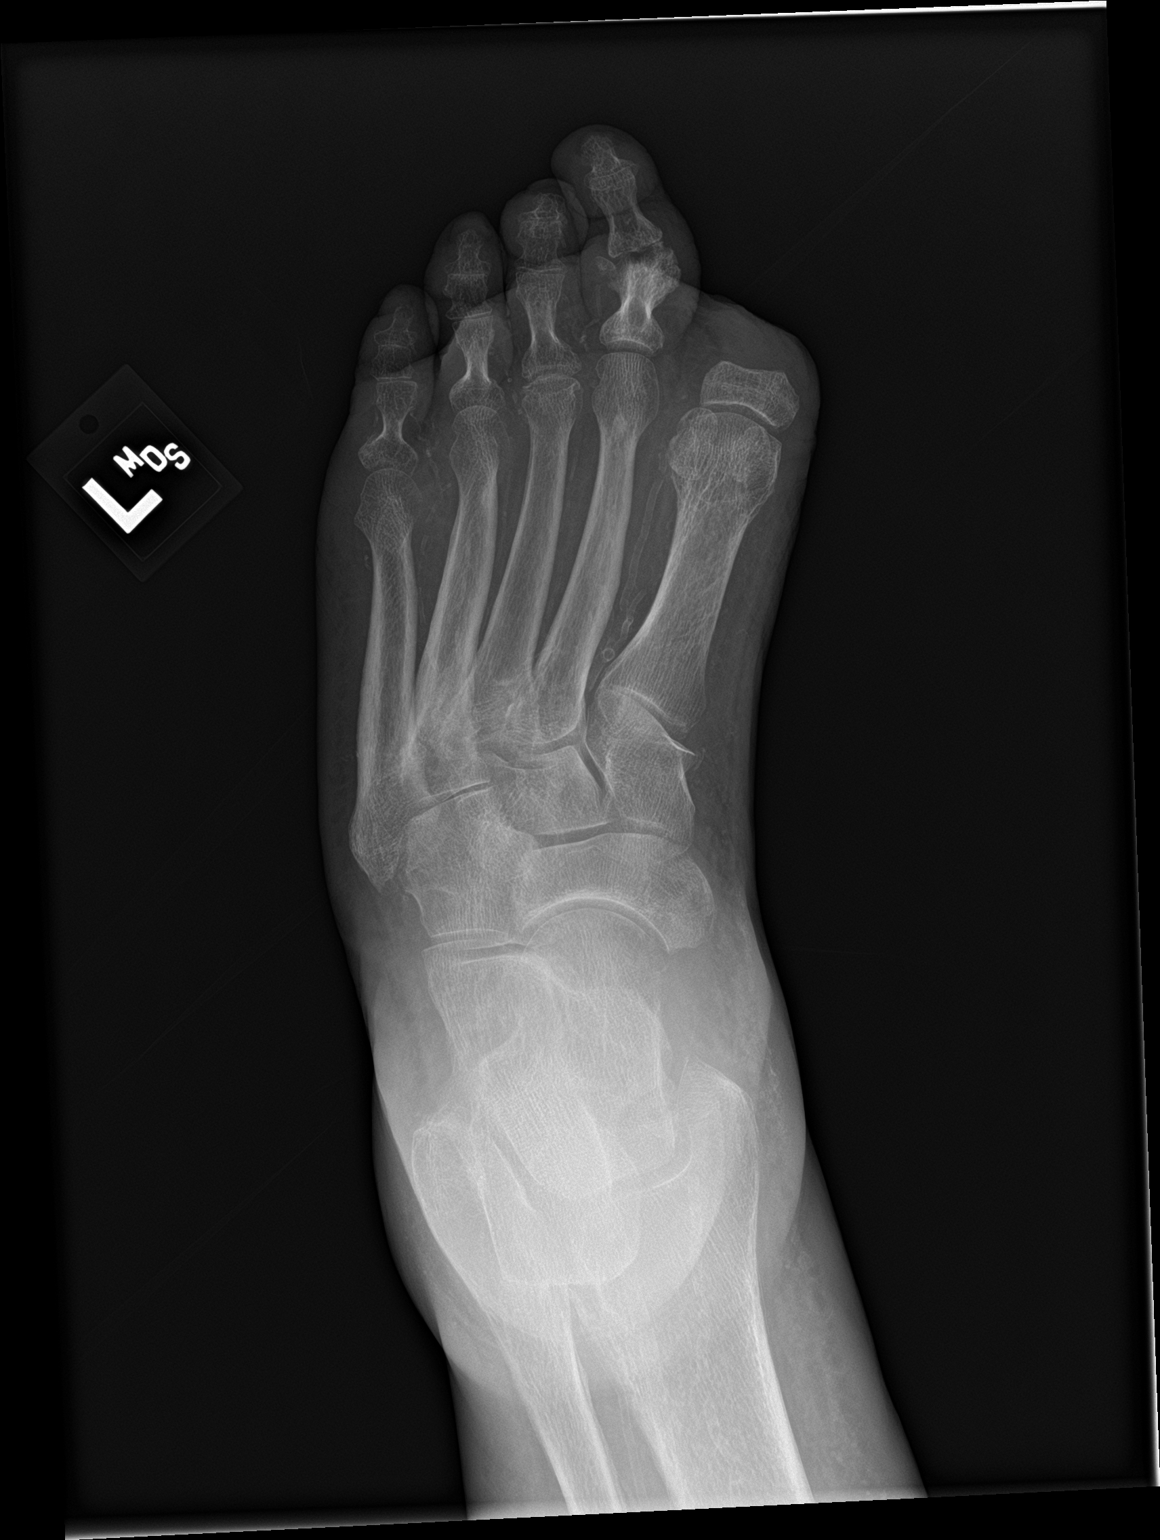

[3 of 3 positions shown; findings below may reference images not displayed]

FINDINGS: Interval amputation across the proximal phalanx left great toe.

Intra-articular fracture involving the head of the proximal phalanx
left second toe, with lateral displacement of a 5 mm sub chondral
fragment. There is sclerosis in the head of the proximal phalanx. No
definite cortical destruction to suggest osteomyelitis.

Mild diffuse osteopenia. Chronic flattening of the head third
metacarpal. Diffuse vascular calcifications. Small calcaneal spurs.
No radiodense foreign body or subcutaneous gas.
IMPRESSION: 1. Interval displaced intra-articular fracture, head proximal
phalanx left second toe since 05/03/2014. Regional sclerosis
suggests this may be nonacute.
2. No radiographic evidence of osteomyelitis.
3. Additional chronic and postop changes as above.

## 2019-02-25 IMAGING — DX DG CHEST 2V
2 series · 2 of 2 positions shown · non-contrast
Comparison: One-view chest x-ray 04/25/2017

CLINICAL DATA: Cough.  Left foot second toe swelling.

EXAM:
CHEST  2 VIEW

[chest lat]
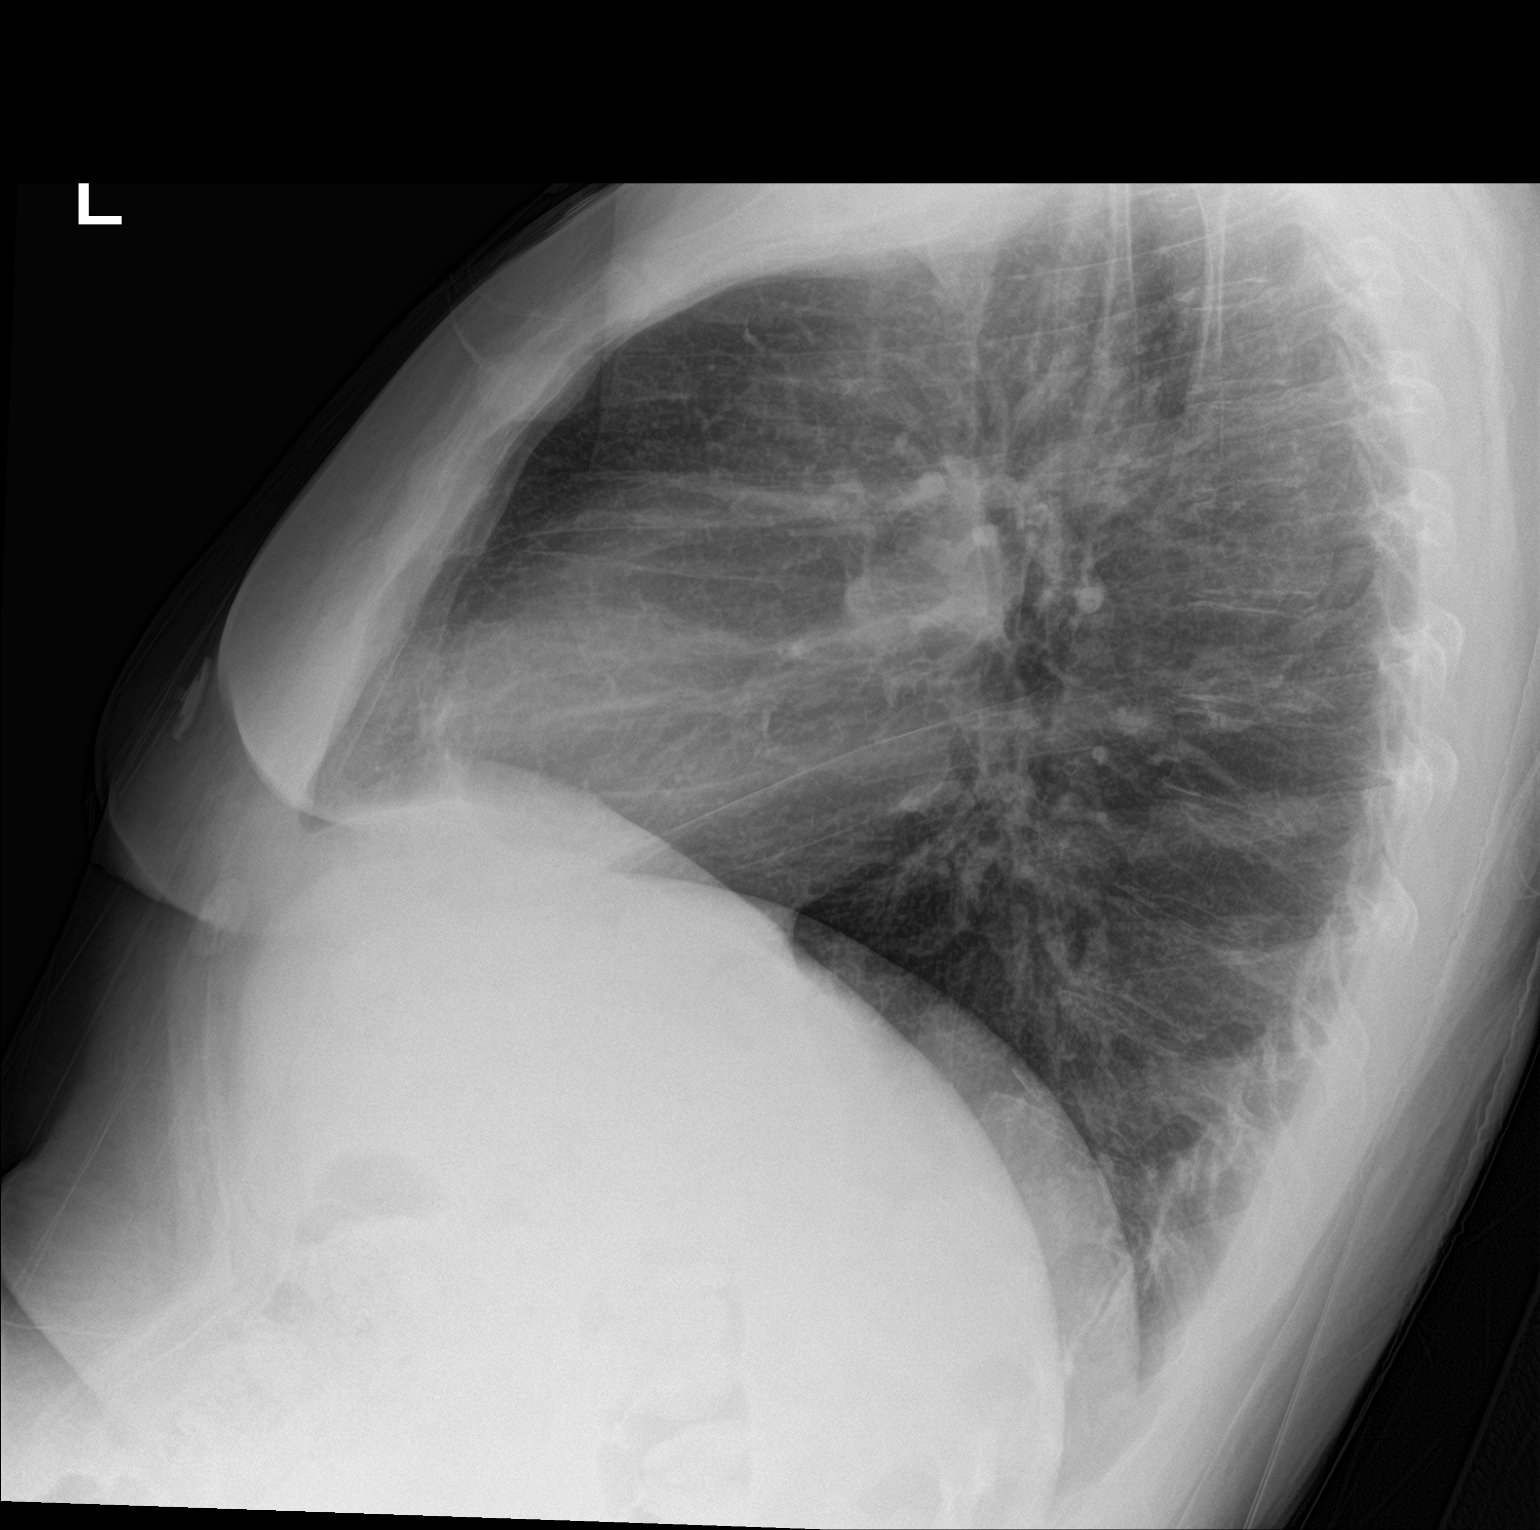

[chest ap]
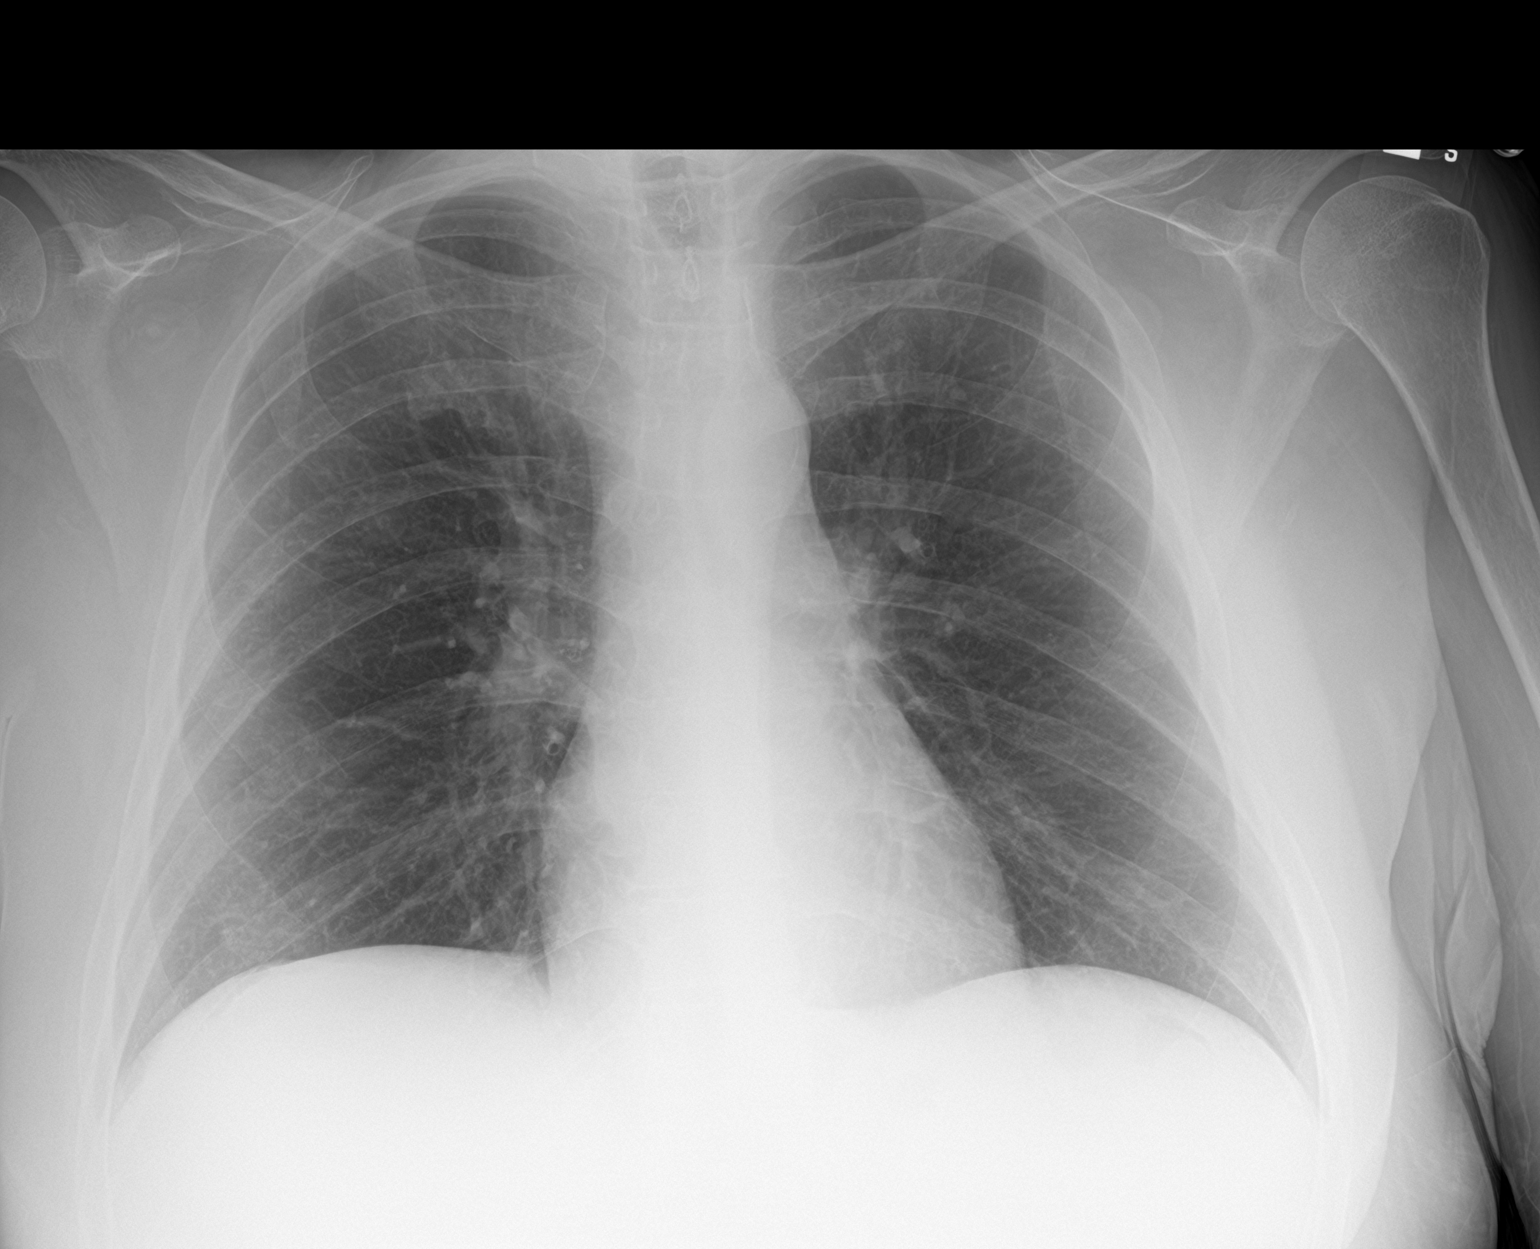

[2 of 2 positions shown; findings below may reference images not displayed]

FINDINGS: The heart size and mediastinal contours are within normal limits.
Both lungs are clear. The visualized skeletal structures are
unremarkable.
IMPRESSION: Negative two view chest x-ray

## 2019-02-25 IMAGING — CT CT HEAD W/O CM
4 series · 17 of 47 positions shown, 19 images · non-contrast
Comparison: 05/13/2016

CLINICAL DATA: Pt has hx of falling Fell last night hit rt side of
head No marks but has a headache

EXAM:
CT HEAD WITHOUT CONTRAST
TECHNIQUE: Contiguous axial images were obtained from the base of the skull
through the vertex without intravenous contrast.

[Series 3: head wo · axial · 0.39mm/px · z∈[-136,-16]mm · 7 of 34 slices shown, 9 images]
[im 5/34  brain]
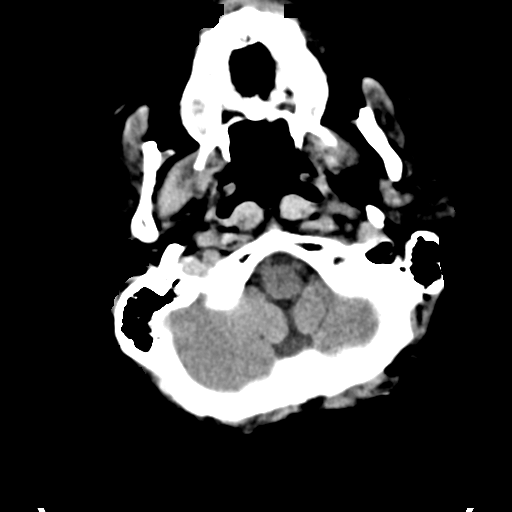
[im 5/34  bone]
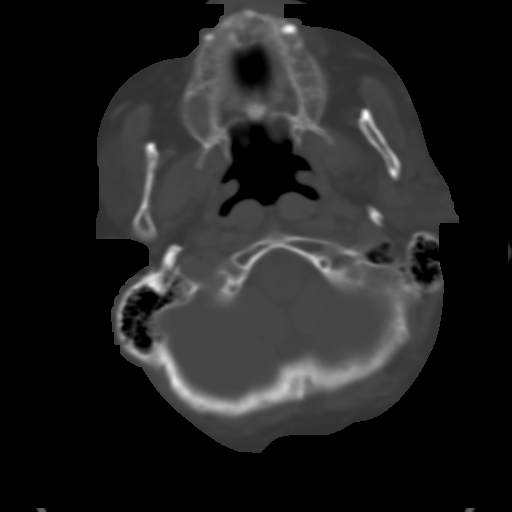
[im 9/34  brain]
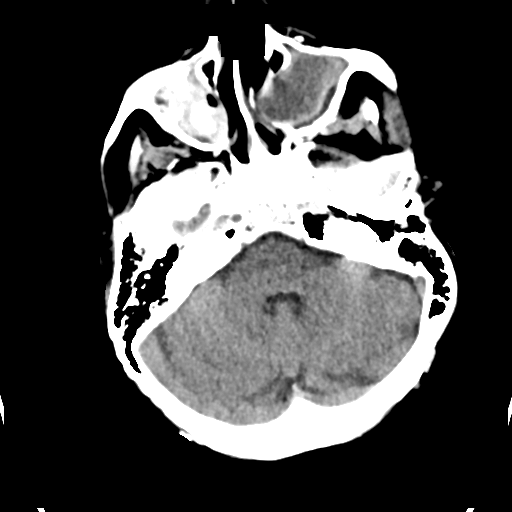
[im 13/34  brain]
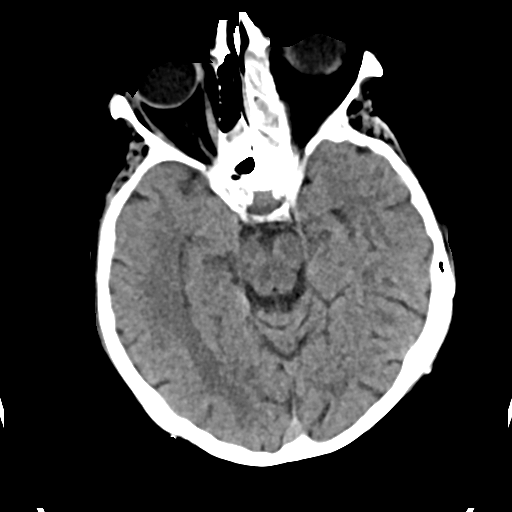
[im 17/34  brain]
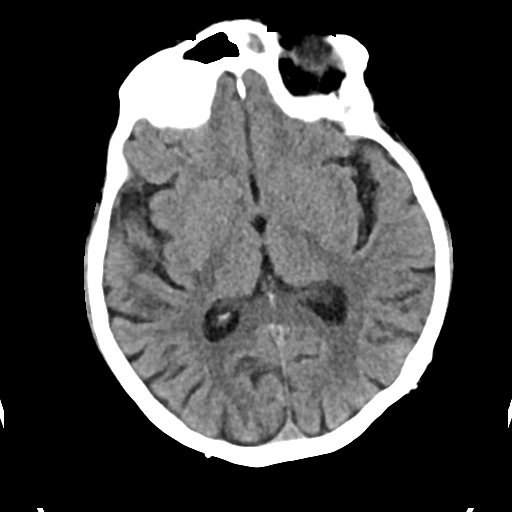
[im 21/34  brain]
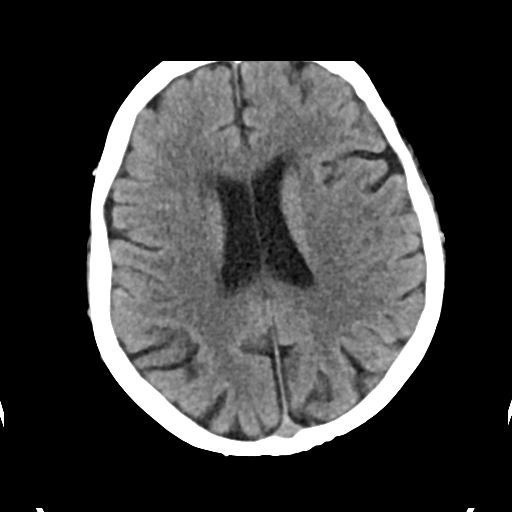
[im 21/34  bone]
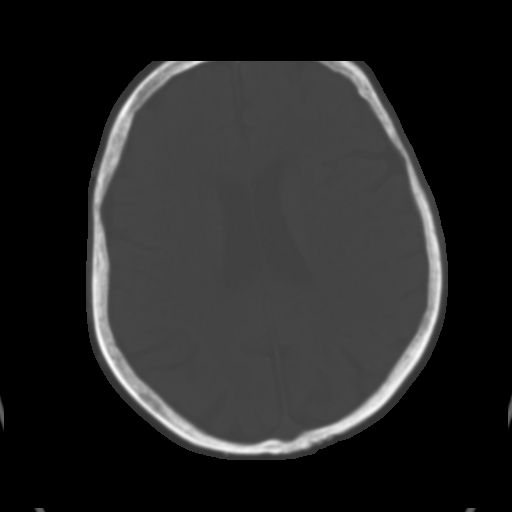
[im 25/34  brain]
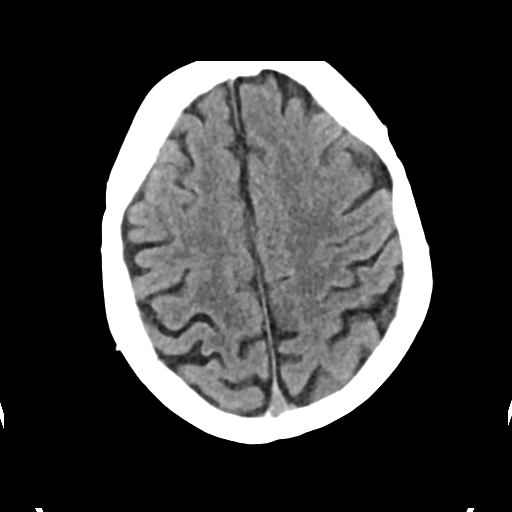
[im 29/34  brain]
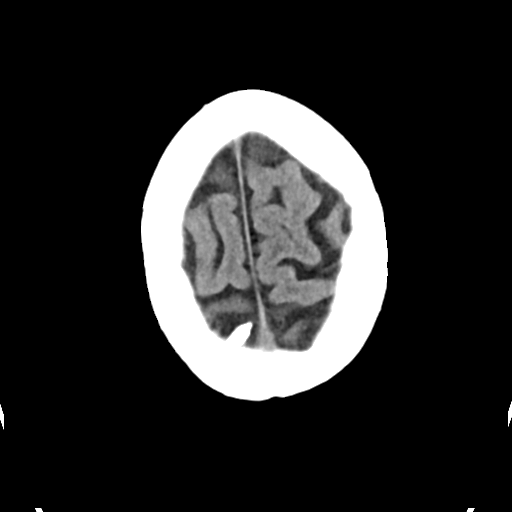

[Series 4: head bone · axial · 0.39mm/px · z∈[-140,-82]mm · 4 of 84 slices shown]
[im 9/84  bone]
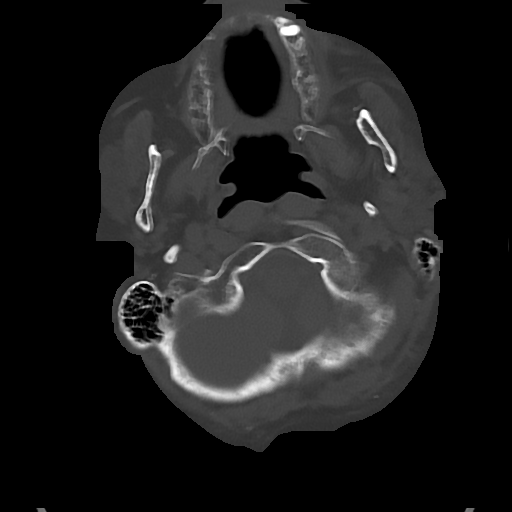
[im 17/84  bone]
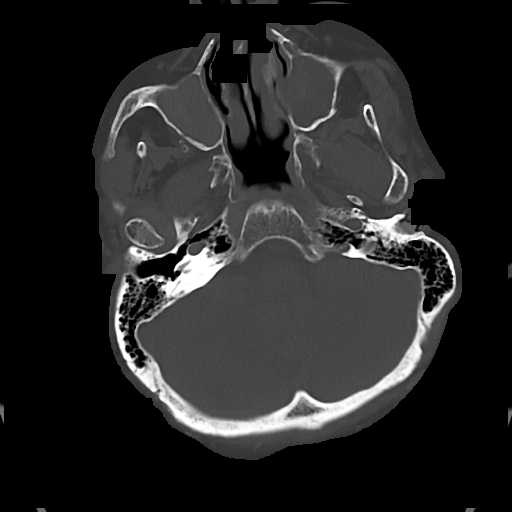
[im 25/84  bone]
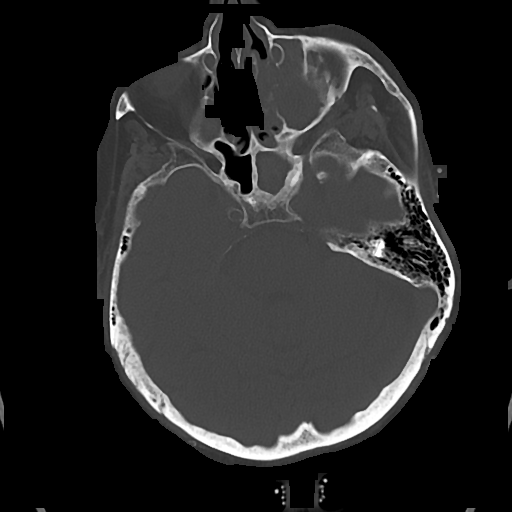
[im 38/84  bone]
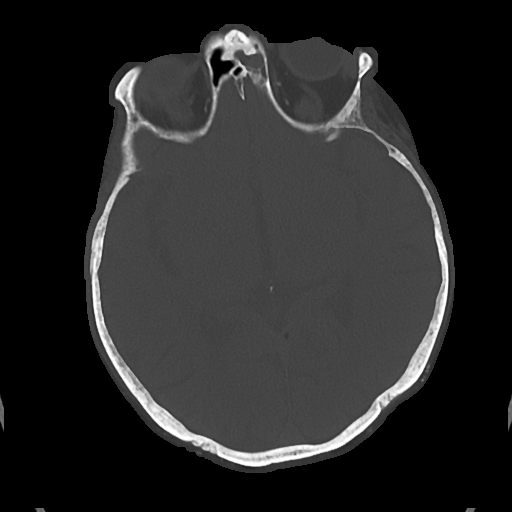

[Series 5: cor soft · coronal · 0.36mm/px · 3 of 68 slices shown]
[im 23/68  brain]
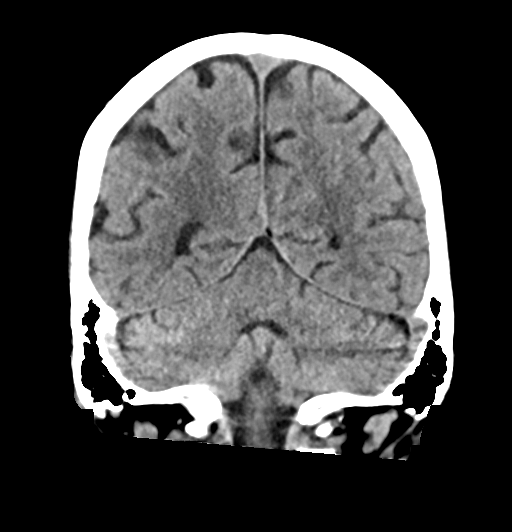
[im 30/68  brain]
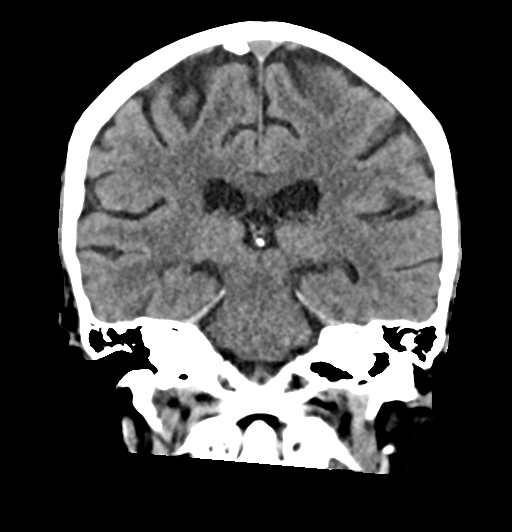
[im 38/68  brain]
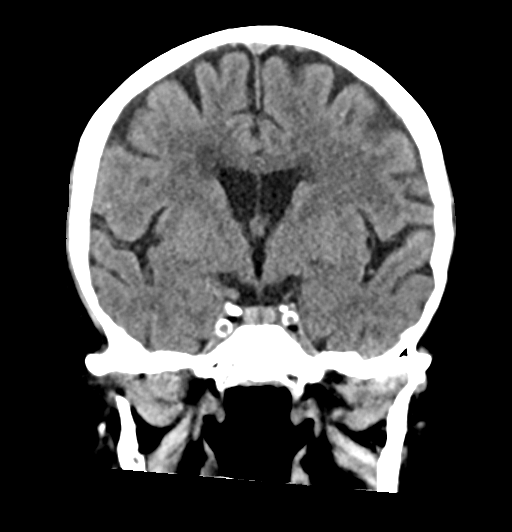

[Series 6: sag soft · sagittal · 0.37mm/px · 3 of 60 slices shown]
[im 20/60  brain]
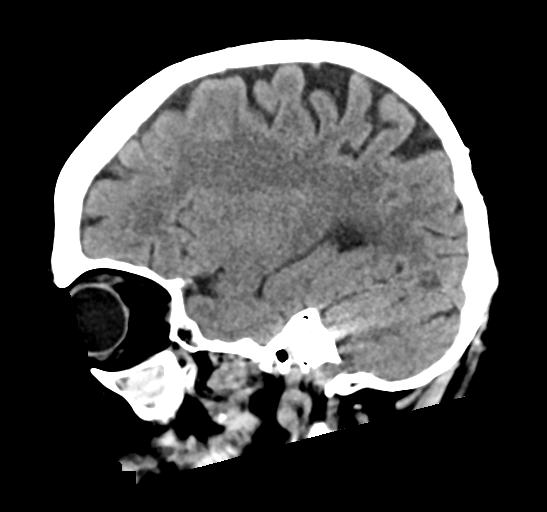
[im 30/60  brain]
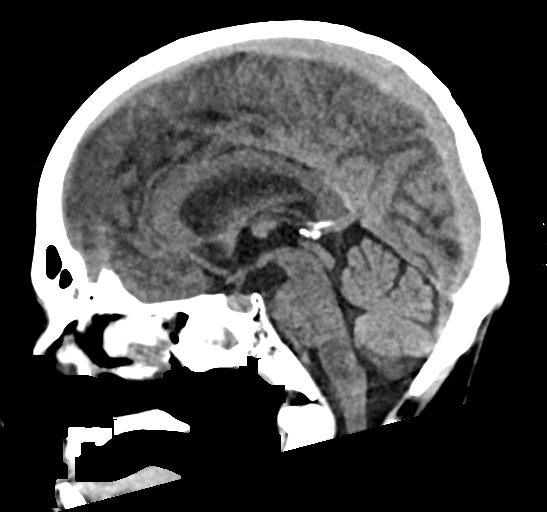
[im 40/60  brain]
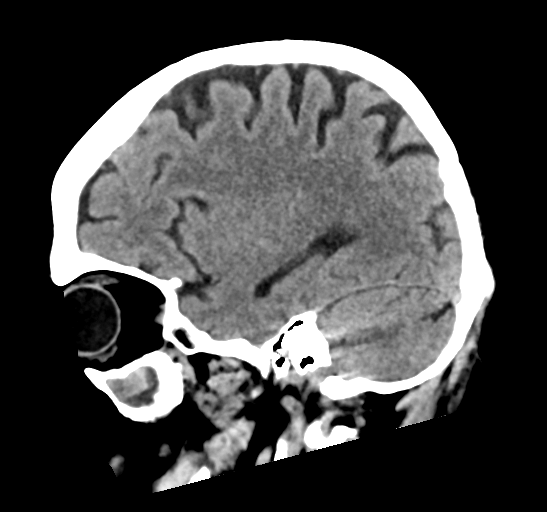

[17 of 47 positions shown; findings below may reference images not displayed]

FINDINGS: Brain: Mild parenchymal atrophy. Patchy areas of hypoattenuation in
periventricular white matter bilaterally. Negative for acute
intracranial hemorrhage, mass lesion, acute infarction, midline
shift, or mass-effect. Acute infarct may be inapparent on
noncontrast CT. Ventricles and sulci symmetric.

Vascular: Atherosclerotic and physiologic intracranial
calcifications.

Skull: Normal. Negative for fracture or focal lesion.

Sinuses/Orbits: Opacification of bilateral maxillary, left ethmoid,
and left sphenoid sinuses . Partial opacification of right ethmoid
air cells. Orbits unremarkable.

Other: None.
IMPRESSION: 1. Negative for bleed or other acute intracranial process.
2. Atrophy and nonspecific white matter changes.
3. Pansinusitis

## 2019-03-16 IMAGING — CT CT HEAD W/O CM
4 series · 17 of 47 positions shown, 19 images · non-contrast
Comparison: 05/09/2017

CLINICAL DATA: Headaches all over starting today.  Nausea.

EXAM:
CT HEAD WITHOUT CONTRAST
TECHNIQUE: Contiguous axial images were obtained from the base of the skull
through the vertex without intravenous contrast.

[Series 3: head without · axial · non-contrast · 0.40mm/px · z∈[-71,+54]mm · 7 of 35 slices shown, 9 images]
[im 5/35  brain]
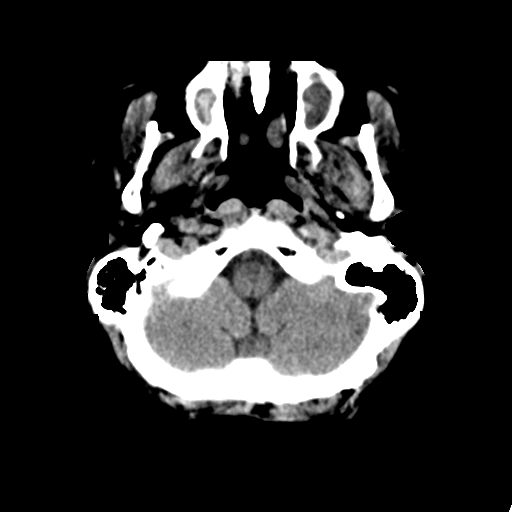
[im 5/35  bone]
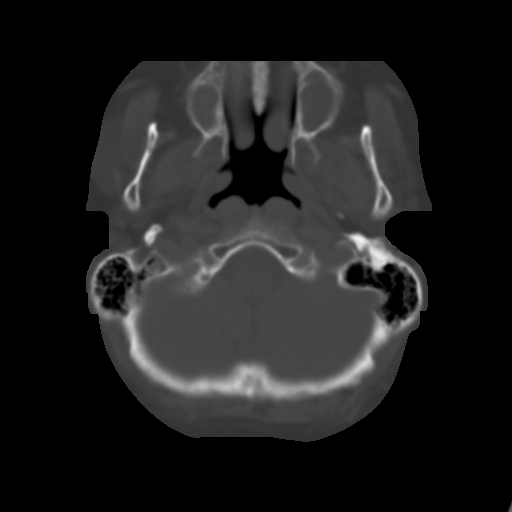
[im 9/35  brain]
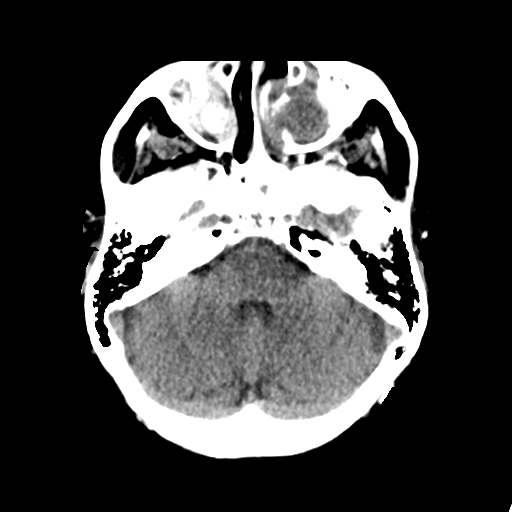
[im 13/35  brain]
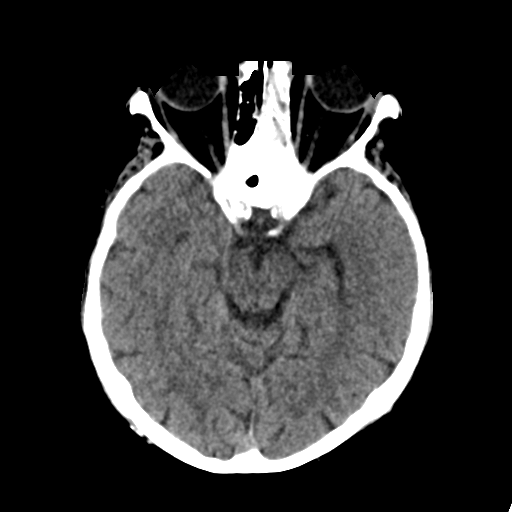
[im 18/35  brain]
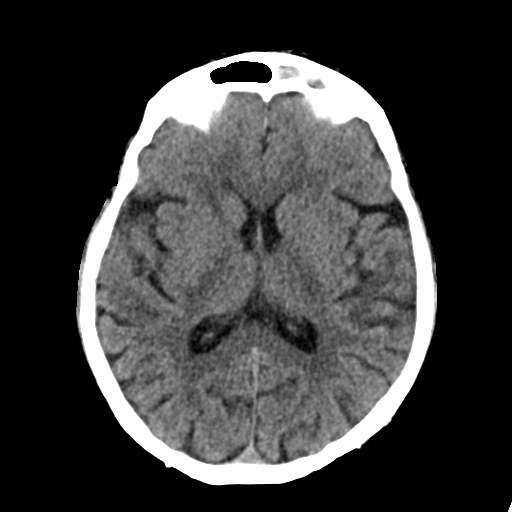
[im 22/35  brain]
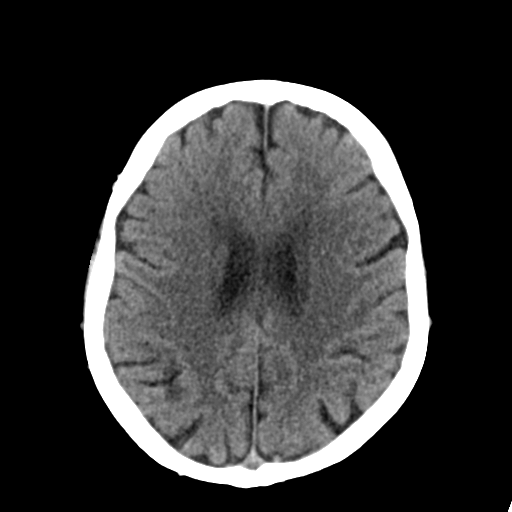
[im 22/35  bone]
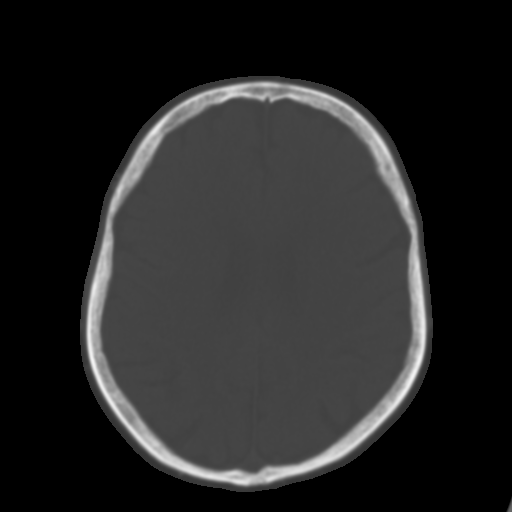
[im 26/35  brain]
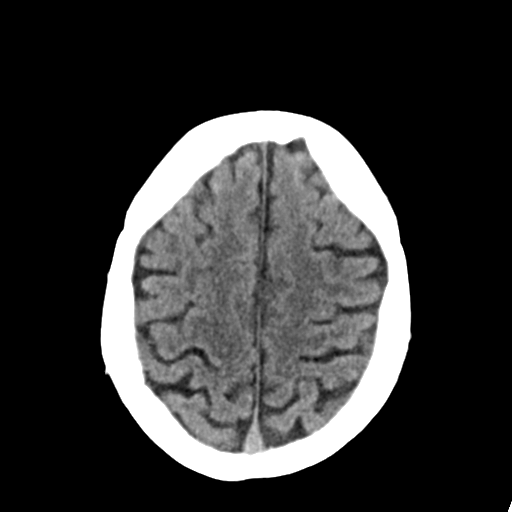
[im 30/35  brain]
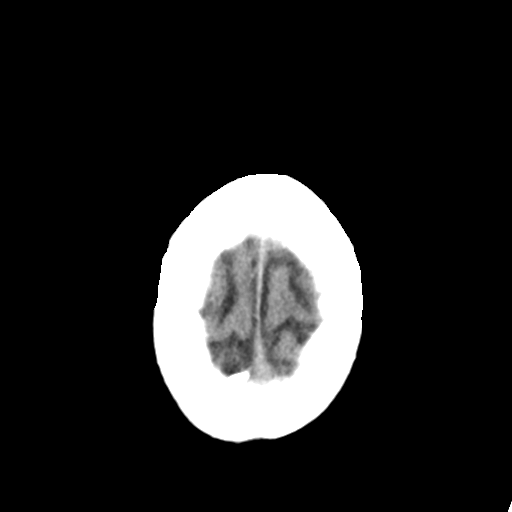

[Series 4: head bone · axial · 0.40mm/px · z∈[-75,-15]mm · 4 of 86 slices shown]
[im 9/86  bone]
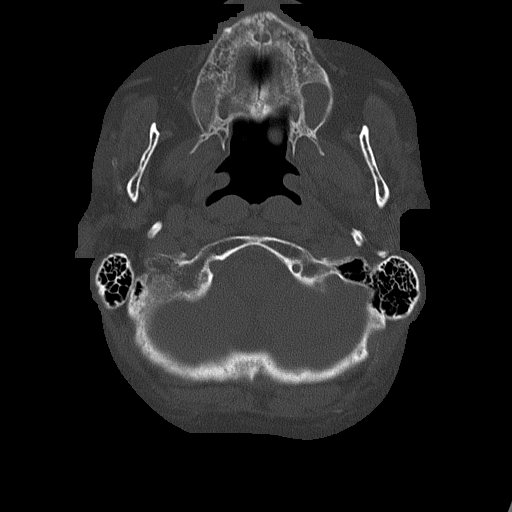
[im 18/86  bone]
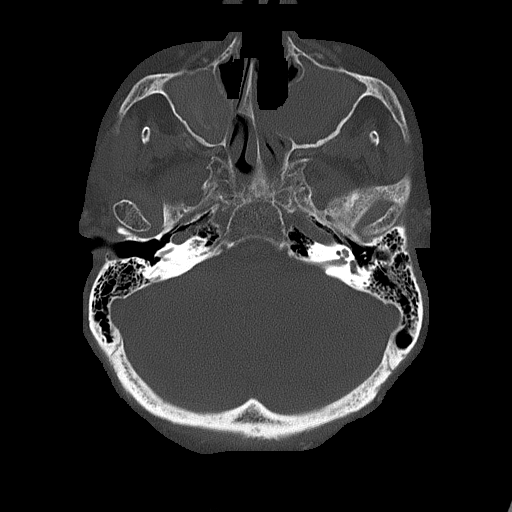
[im 26/86  bone]
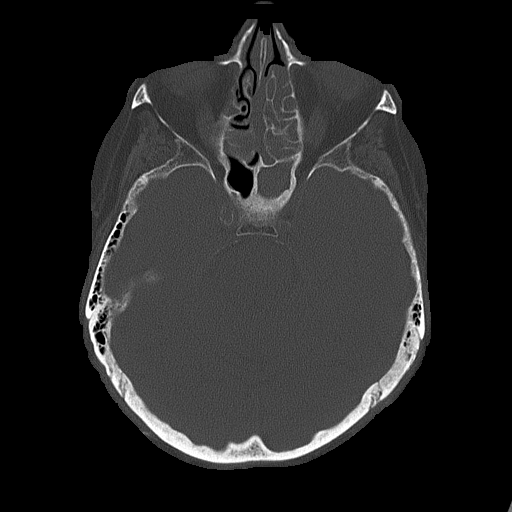
[im 39/86  bone]
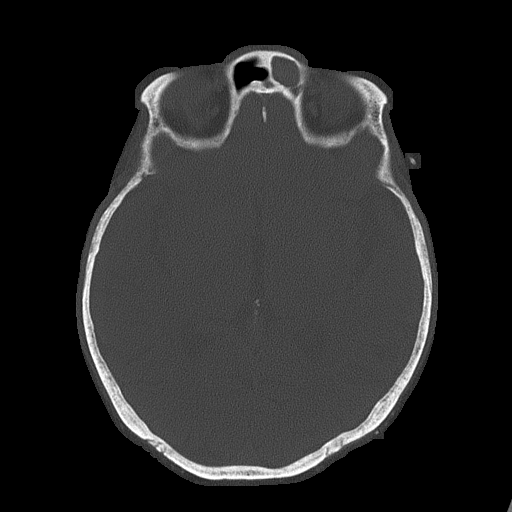

[Series 5: head without cor · coronal · non-contrast · 0.32mm/px · 3 of 65 slices shown]
[im 22/65  brain]
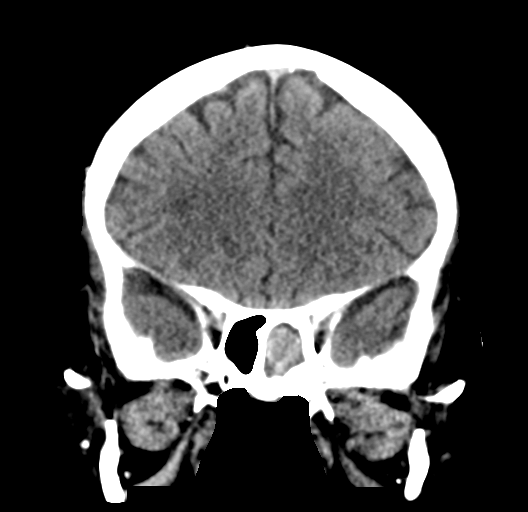
[im 29/65  brain]
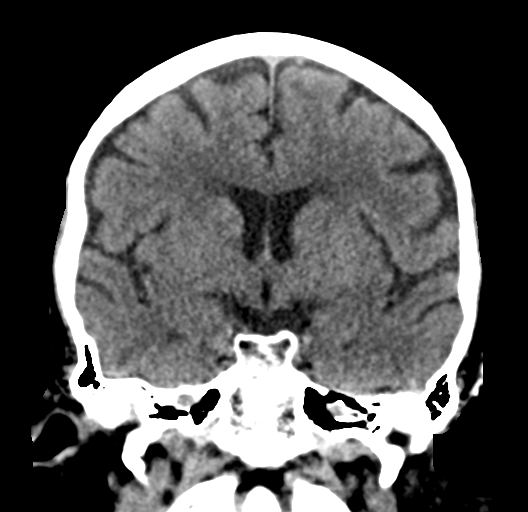
[im 36/65  brain]
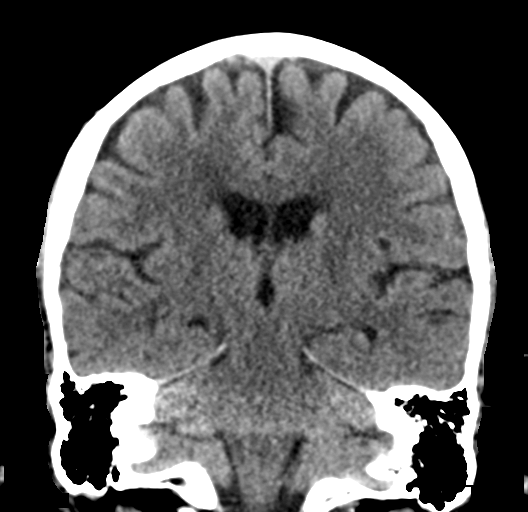

[Series 6: head without sag · sagittal · non-contrast · 0.32mm/px · 3 of 57 slices shown]
[im 19/57  brain]
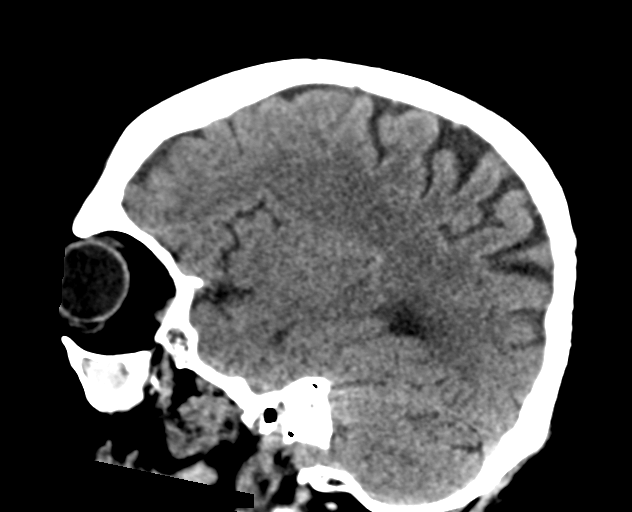
[im 29/57  brain]
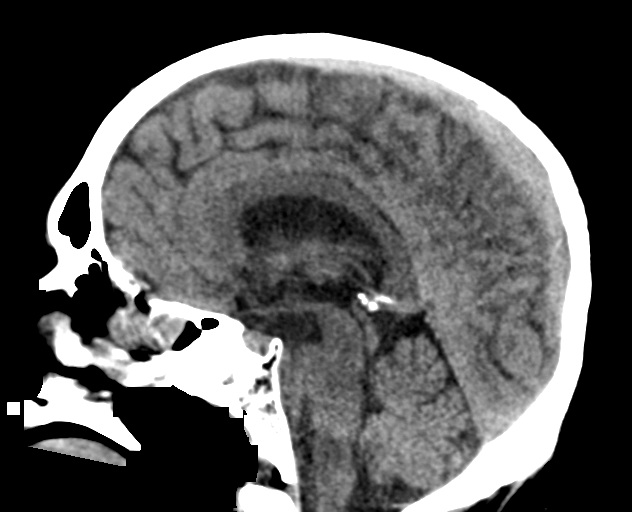
[im 38/57  brain]
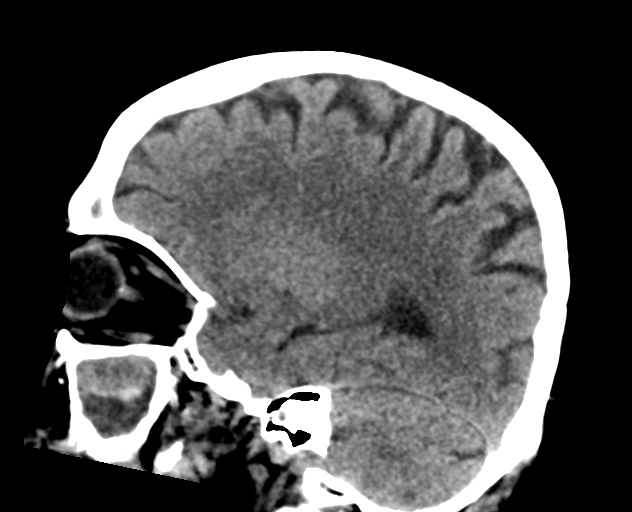

[17 of 47 positions shown; findings below may reference images not displayed]

FINDINGS: Brain: Mild diffuse cerebral atrophy. Patchy low-attenuation changes
in the deep white matter consistent with small vessel ischemia. No
evidence of acute infarction, hemorrhage, hydrocephalus, extra-axial
collection or mass lesion/mass effect.

Vascular: Vascular calcifications in the internal carotid artery's.

Skull: Calvarium appears intact.

Sinuses/Orbits: There is diffuse opacification of the paranasal
sinuses. Maxillary antral walls are expanded and there is increased
density within the sinuses consistent with inspissated mucus,
chronic sinusitis, and/ or fungal disease. Appearance is similar to
previous study. Mastoid air cells are not opacified.

Other: None.
IMPRESSION: No acute intracranial abnormalities. Mild chronic atrophy and small
vessel ischemic changes. Changes of pansinusitis. No change since
prior study.

## 2019-03-21 IMAGING — CR DG HIP (WITH OR WITHOUT PELVIS) 2-3V*R*
3 series · 3 of 3 positions shown · non-contrast
Comparison: 03/26/2017

CLINICAL DATA: Fall with right hip pain

EXAM:
DG HIP (WITH OR WITHOUT PELVIS) 2-3V RIGHT

[x pelvis]
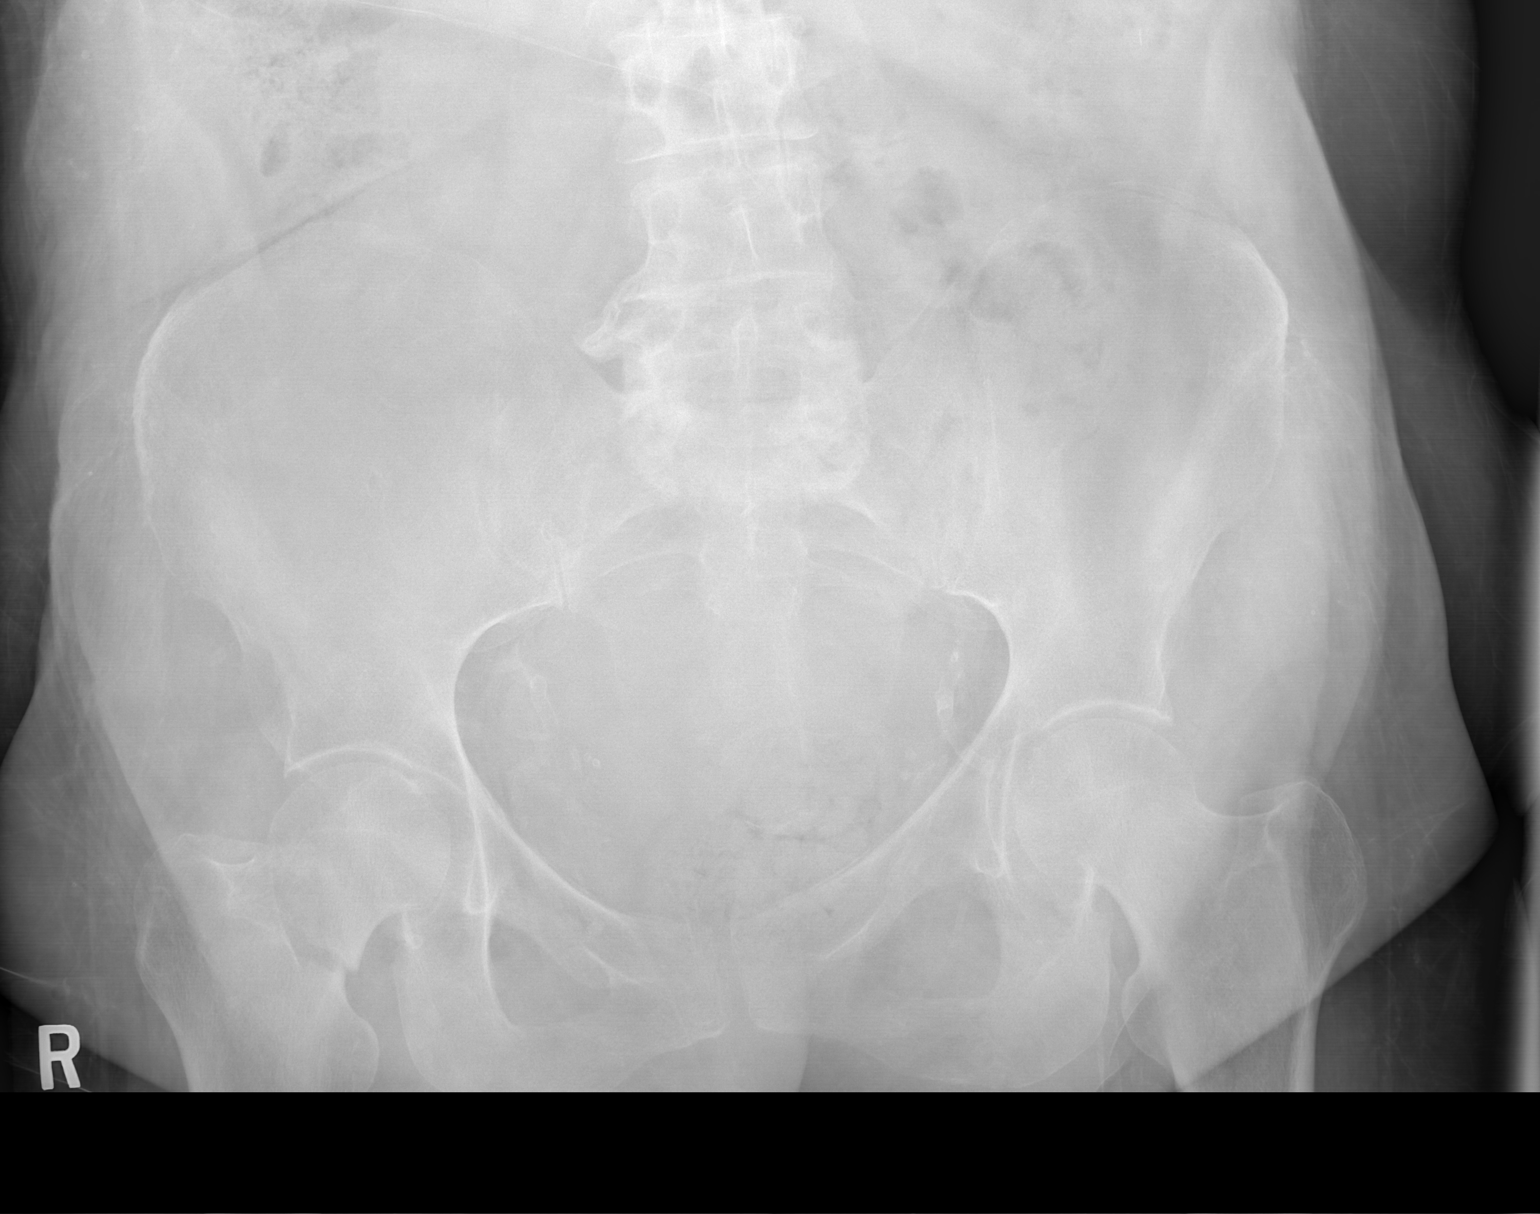

[x hip ap right]
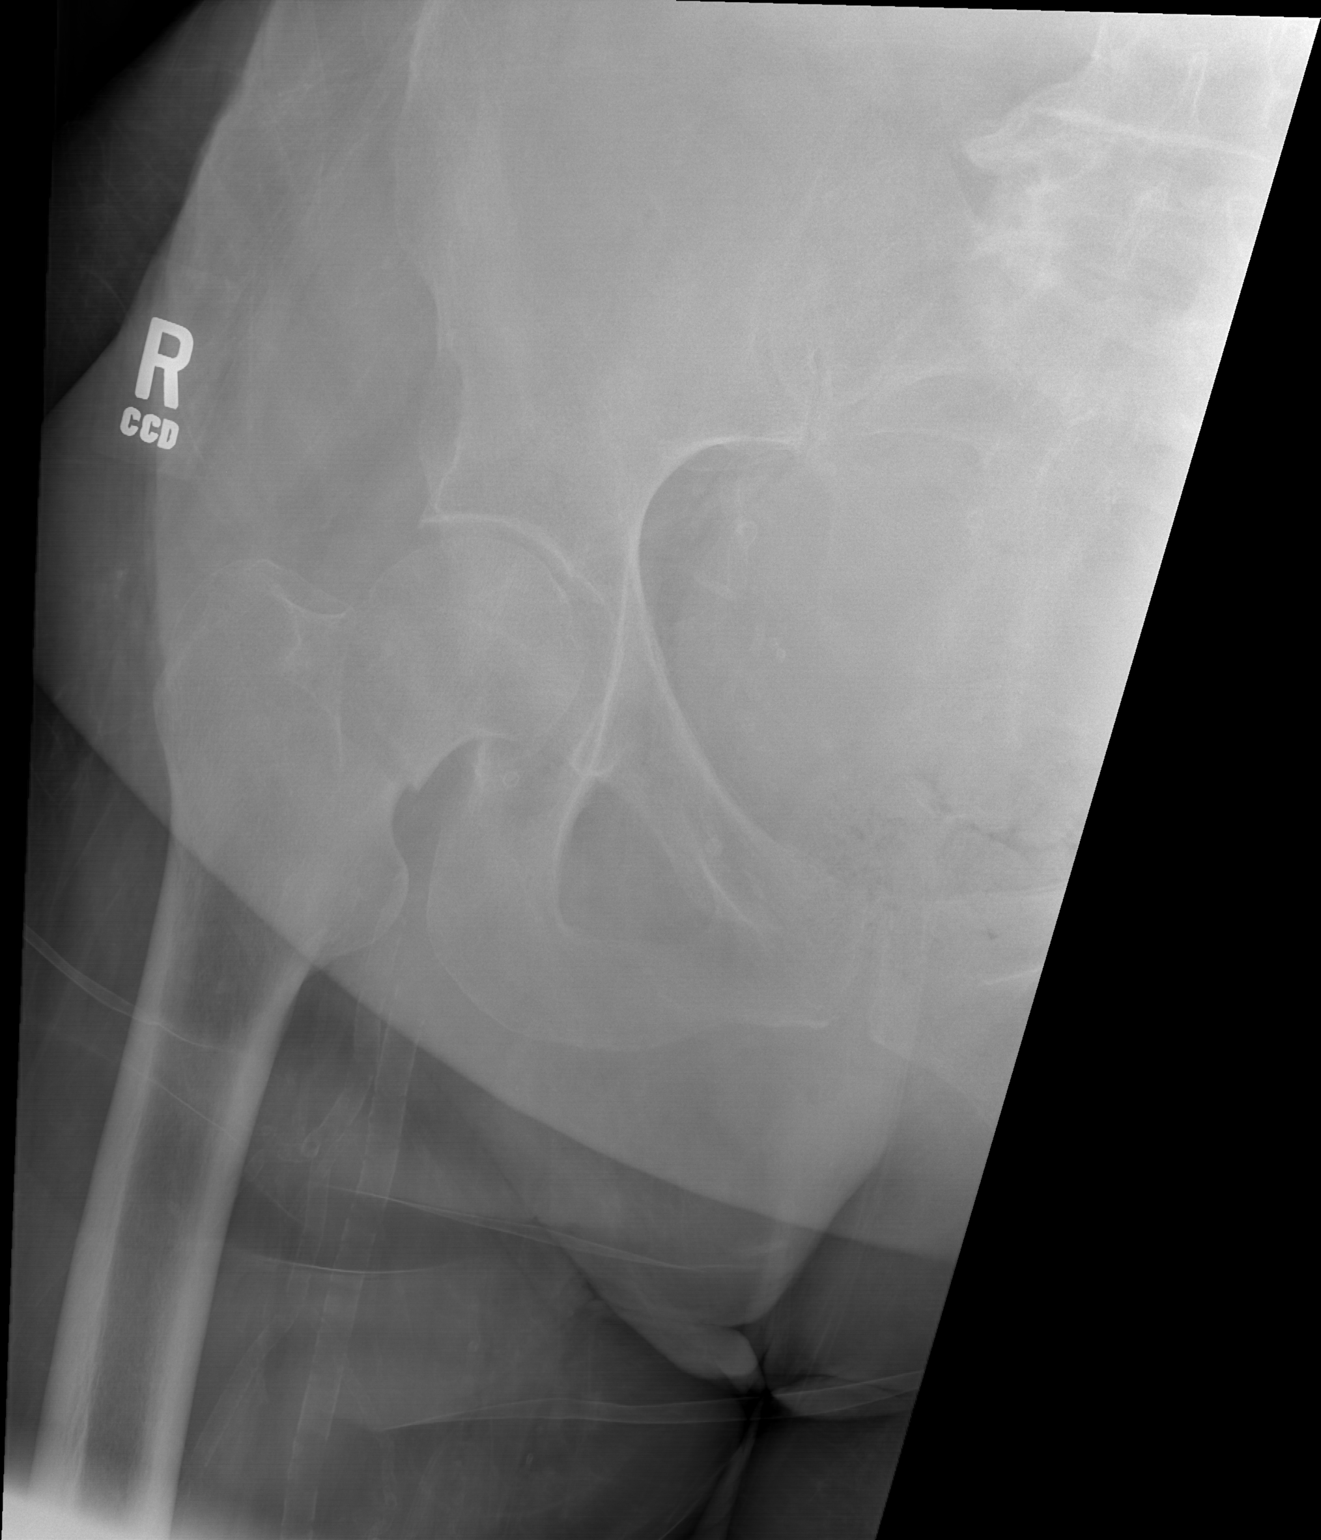

[w hip lat right]
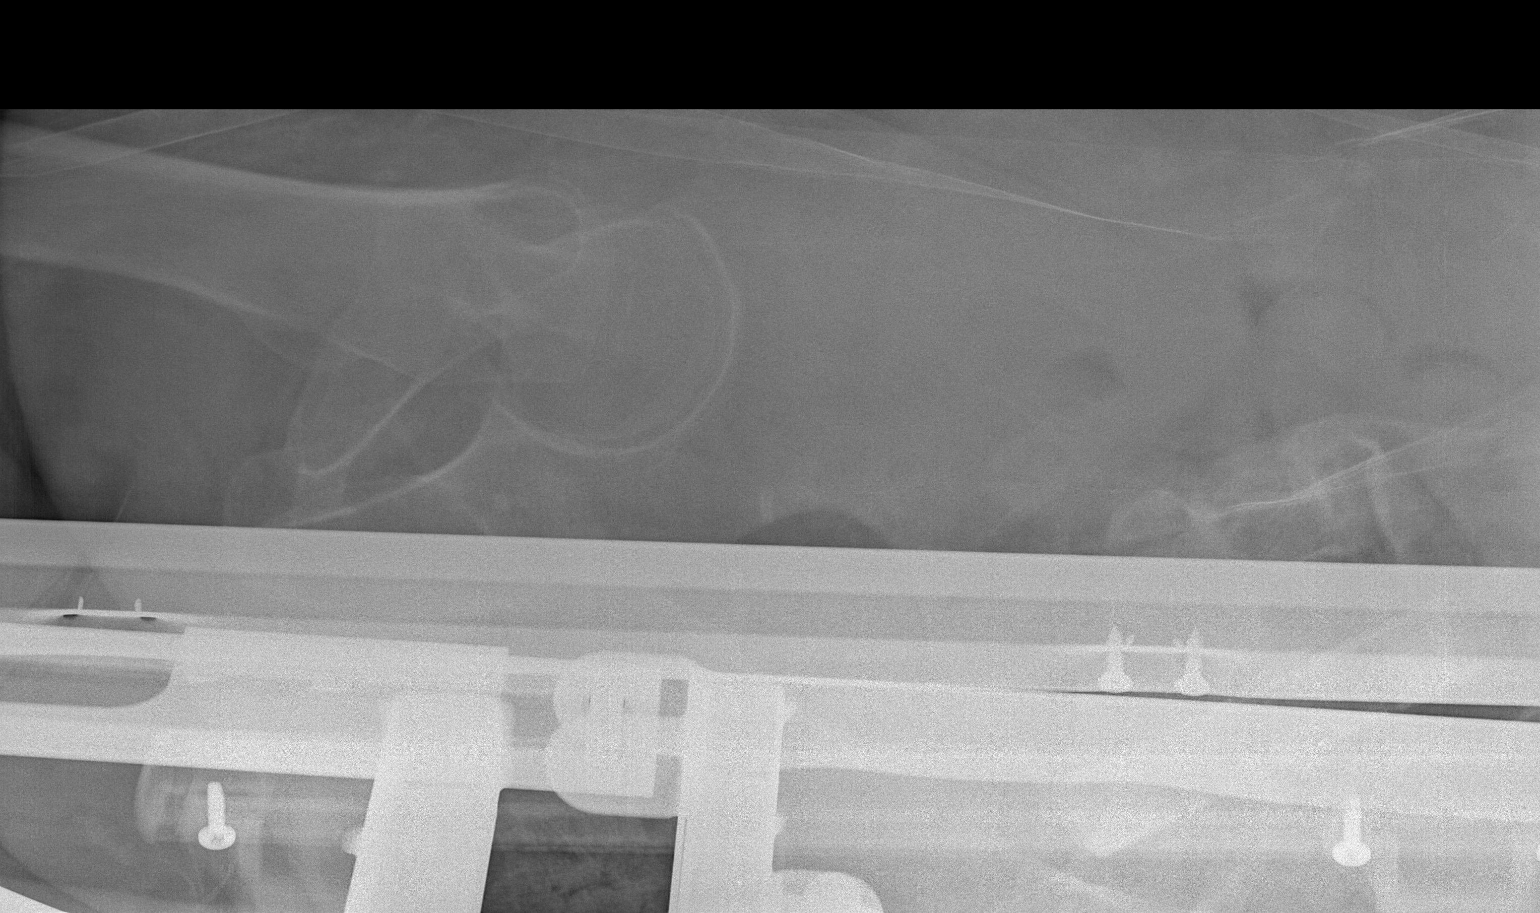

[3 of 3 positions shown; findings below may reference images not displayed]

FINDINGS: Acute right femoral neck fracture at the base of the femoral neck.
No dislocation of the femoral head. Pubic symphysis is intact. Old
deformity of the right superior pubic ramus. Vascular calcification
IMPRESSION: Acute right femoral neck fracture.

## 2019-03-21 IMAGING — DX DG CHEST 1V PORT
1 series · 2 of 2 positions shown · non-contrast
Comparison: 05/09/2017

CLINICAL DATA: Preop, history of hypertension and diabetes

EXAM:
PORTABLE CHEST 1 VIEW

[Series 1: chest ap · 0.14mm/px · 2 of 2 slices shown]
[im 1/2]
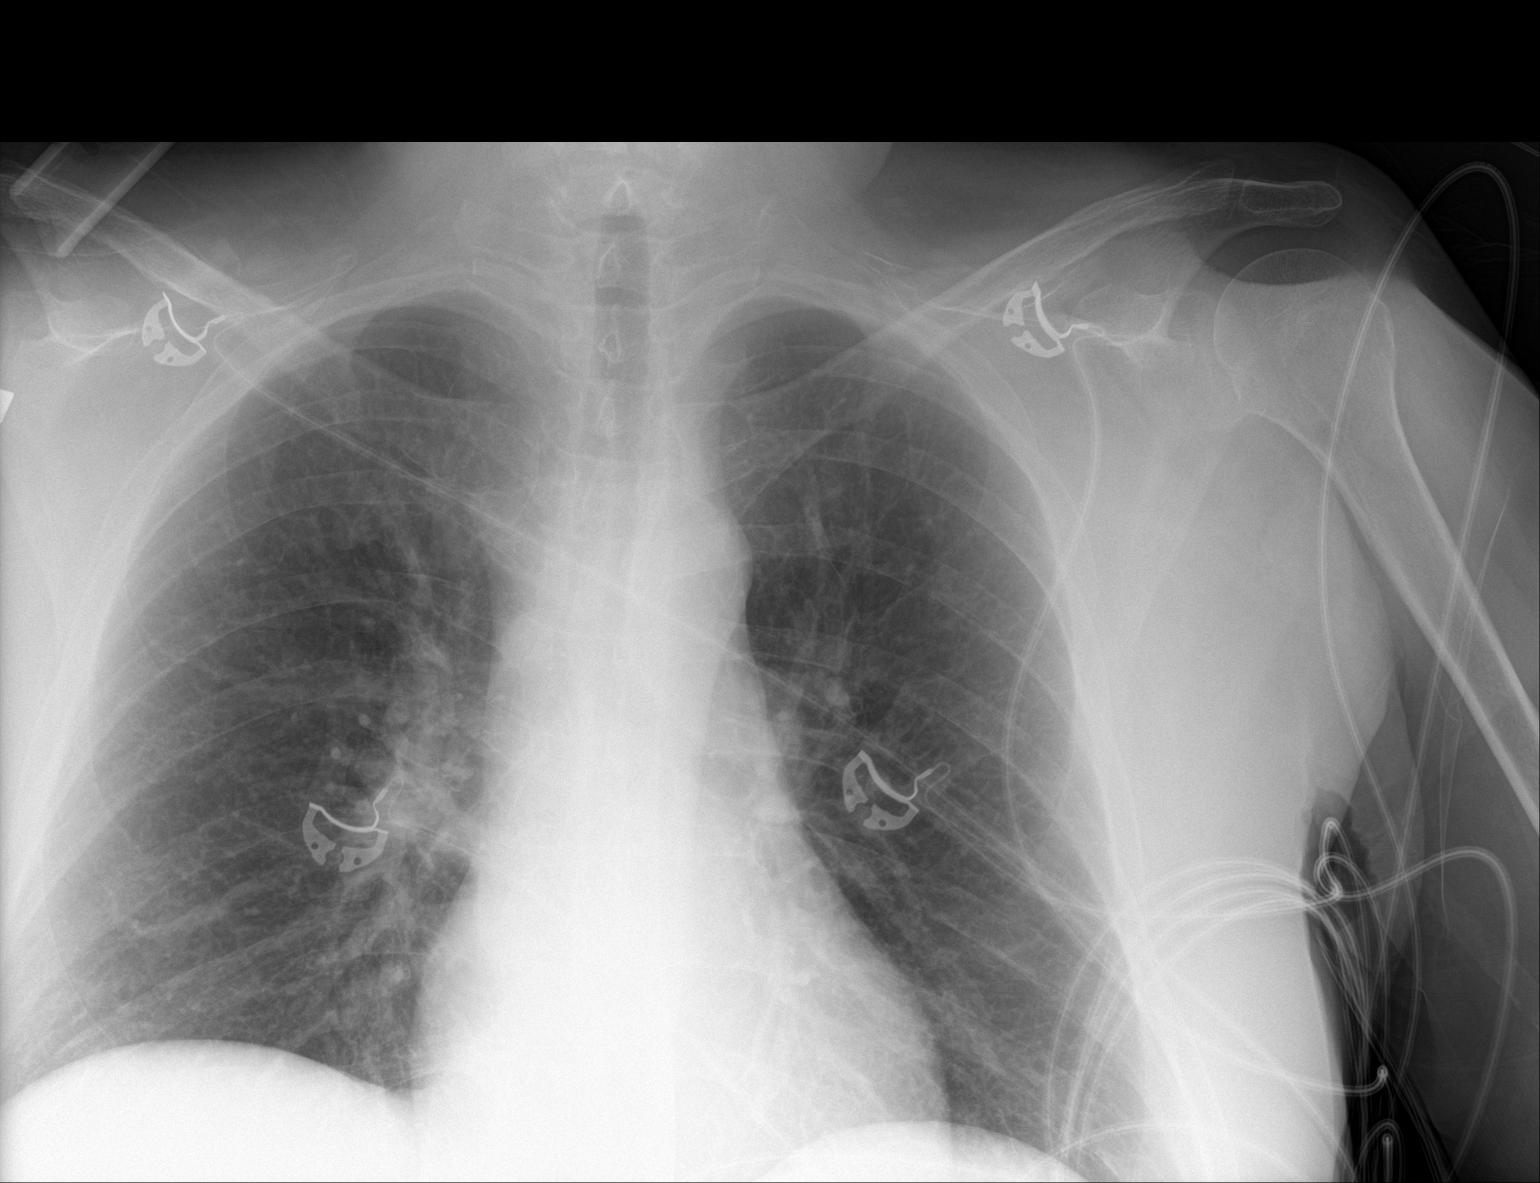
[im 2/2]
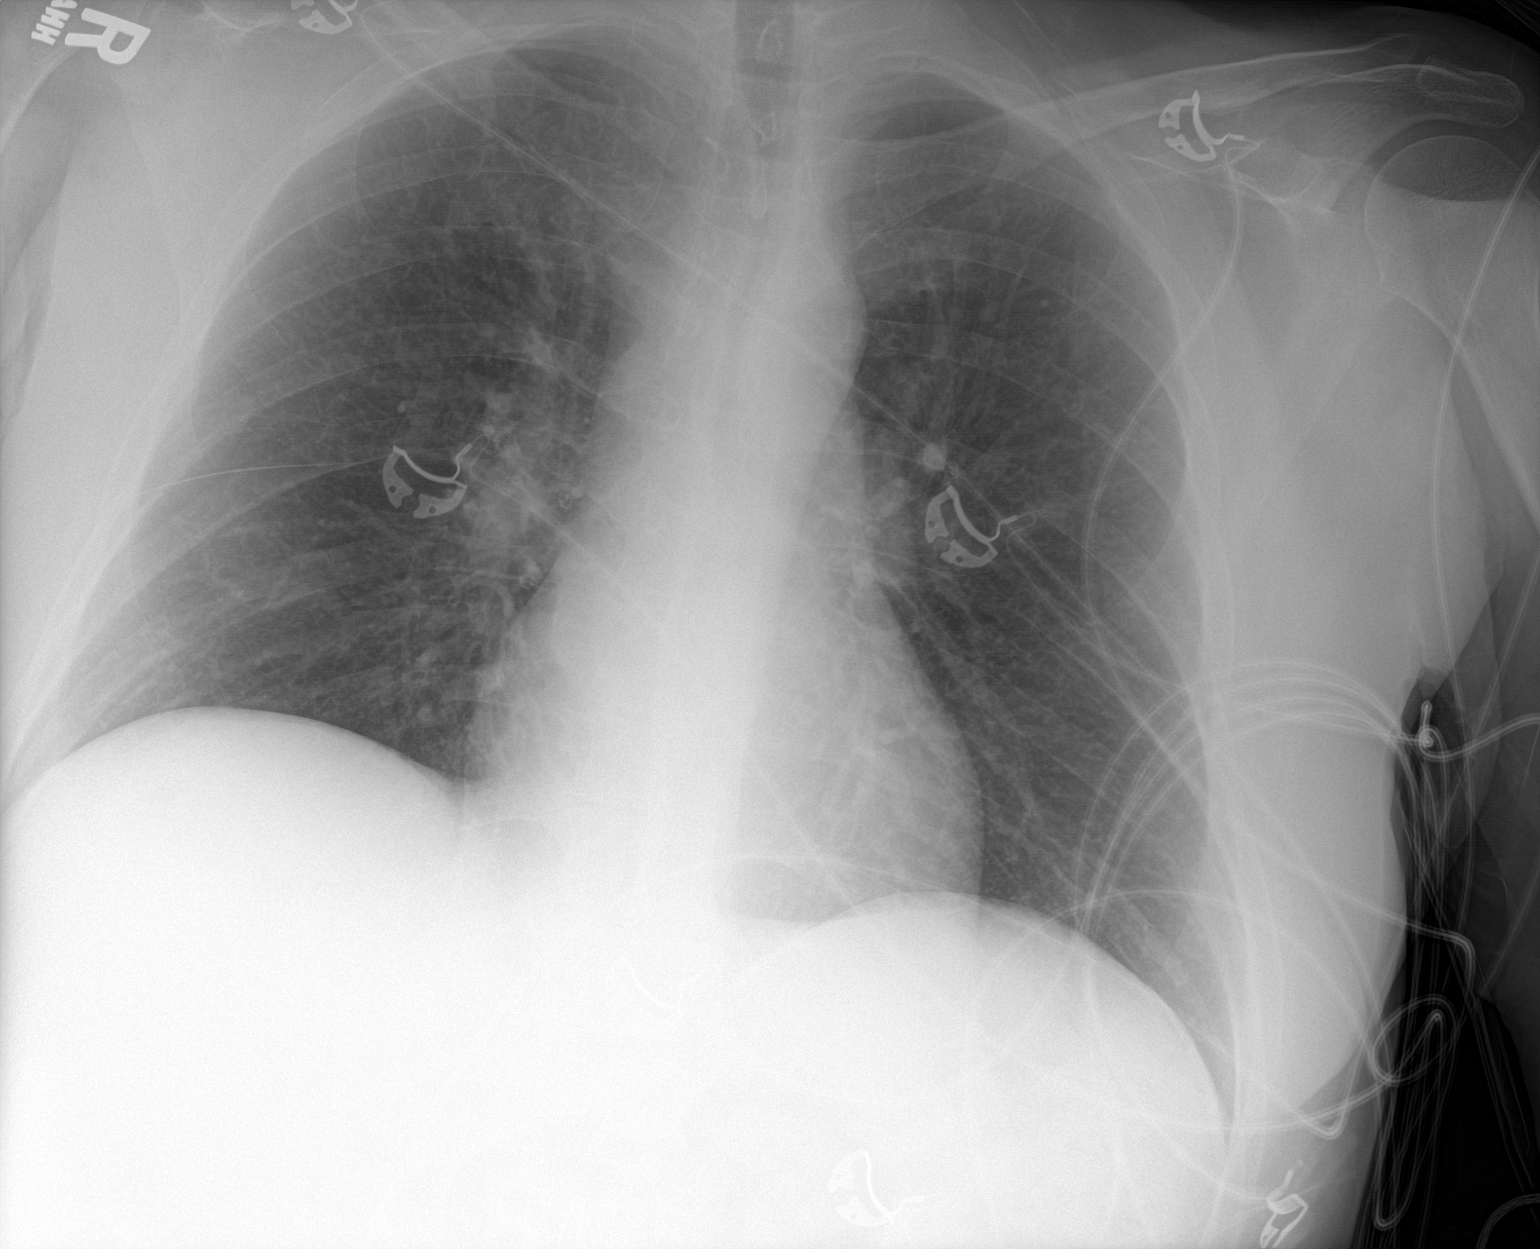

[2 of 2 positions shown; findings below may reference images not displayed]

FINDINGS: The heart size and mediastinal contours are within normal limits.
Both lungs are clear. The visualized skeletal structures are
unremarkable.
IMPRESSION: No active disease.

## 2019-03-21 IMAGING — RF DG HIP (WITH PELVIS) OPERATIVE*R*
1 series · 2 of 2 positions shown · non-contrast
Comparison: Preoperative images dated 06/02/2017 at 2977 hours

CLINICAL DATA: Right hip pinning; 1 min 42 sec fluoro time,
[id number]

EXAM:
OPERATIVE RIGHT HIP (WITH PELVIS IF PERFORMED) 2 VIEWS
TECHNIQUE: Fluoroscopic spot image(s) were submitted for interpretation
post-operatively.

[Series 1: run · 2 of 2 slices shown]
[im 1/2]
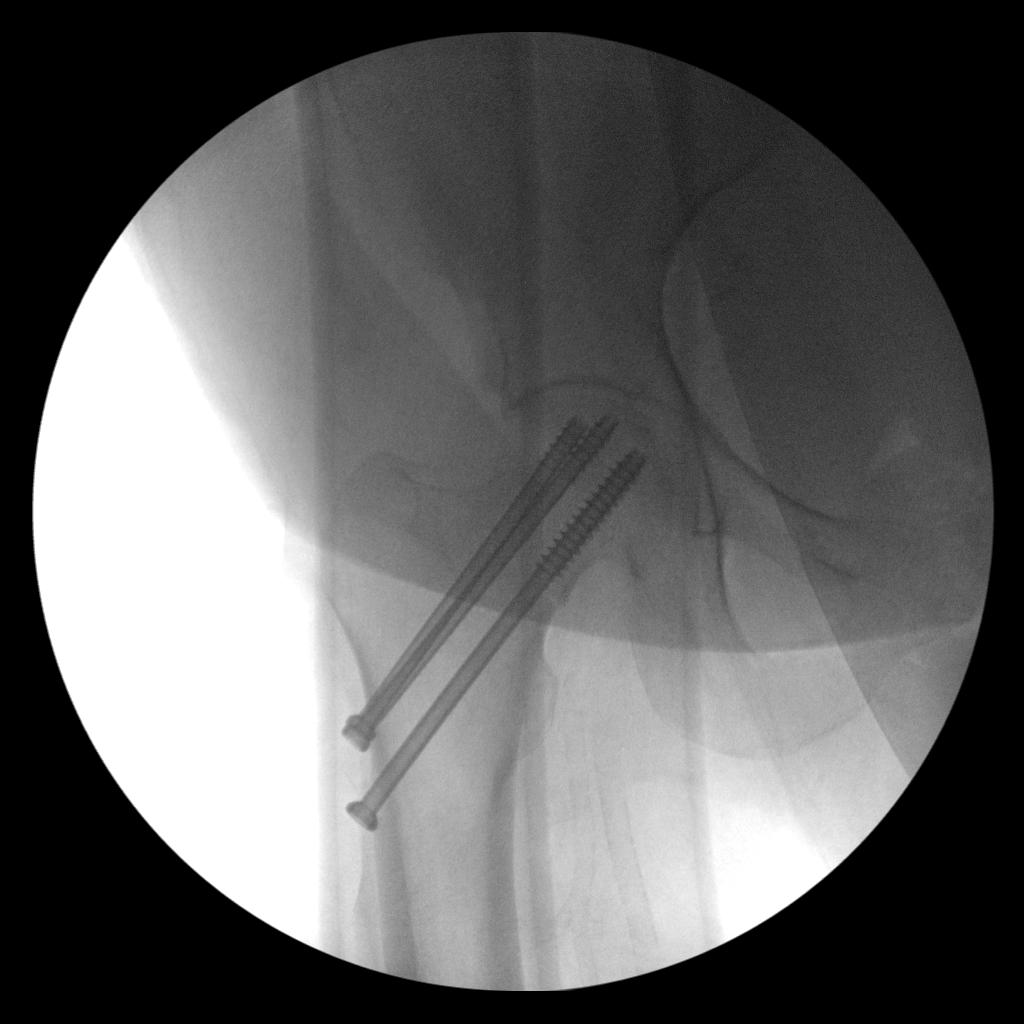
[im 2/2]
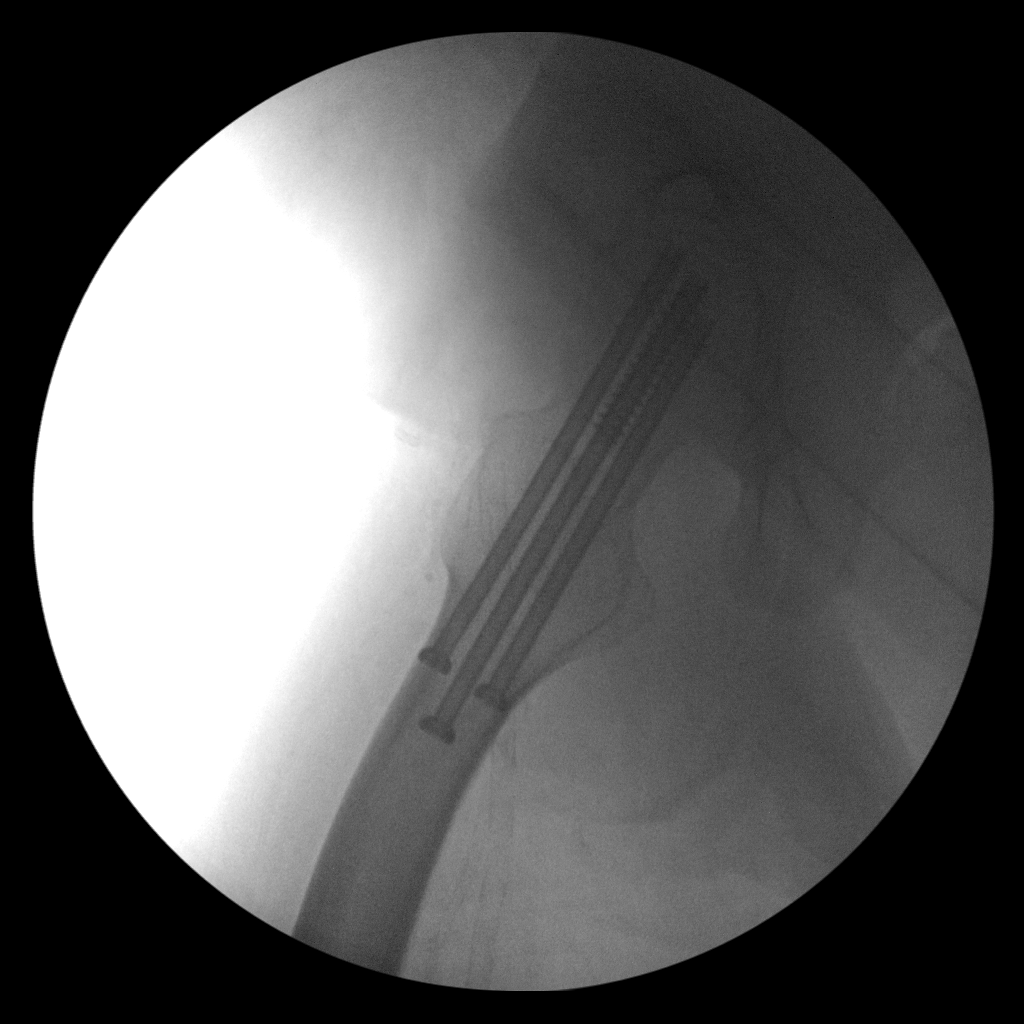

[2 of 2 positions shown; findings below may reference images not displayed]

FINDINGS: Two fluoroscopic images show placement of 3 screws across the right
femoral neck fracture reducing the fracture into near anatomic
alignment. The orthopedic hardware is well-seated. No new fracture
or evidence of an operative complication.
IMPRESSION: Well-aligned right femoral neck fracture following ORIF.

## 2019-03-21 IMAGING — DX DG FEMUR 2+V PORT*R*
4 series · 4 of 4 positions shown · non-contrast
Comparison: None.

CLINICAL DATA: Right hip and knee pain after fall today.

EXAM:
RIGHT FEMUR PORTABLE 2 VIEW

[femur ap (1 of 2)]
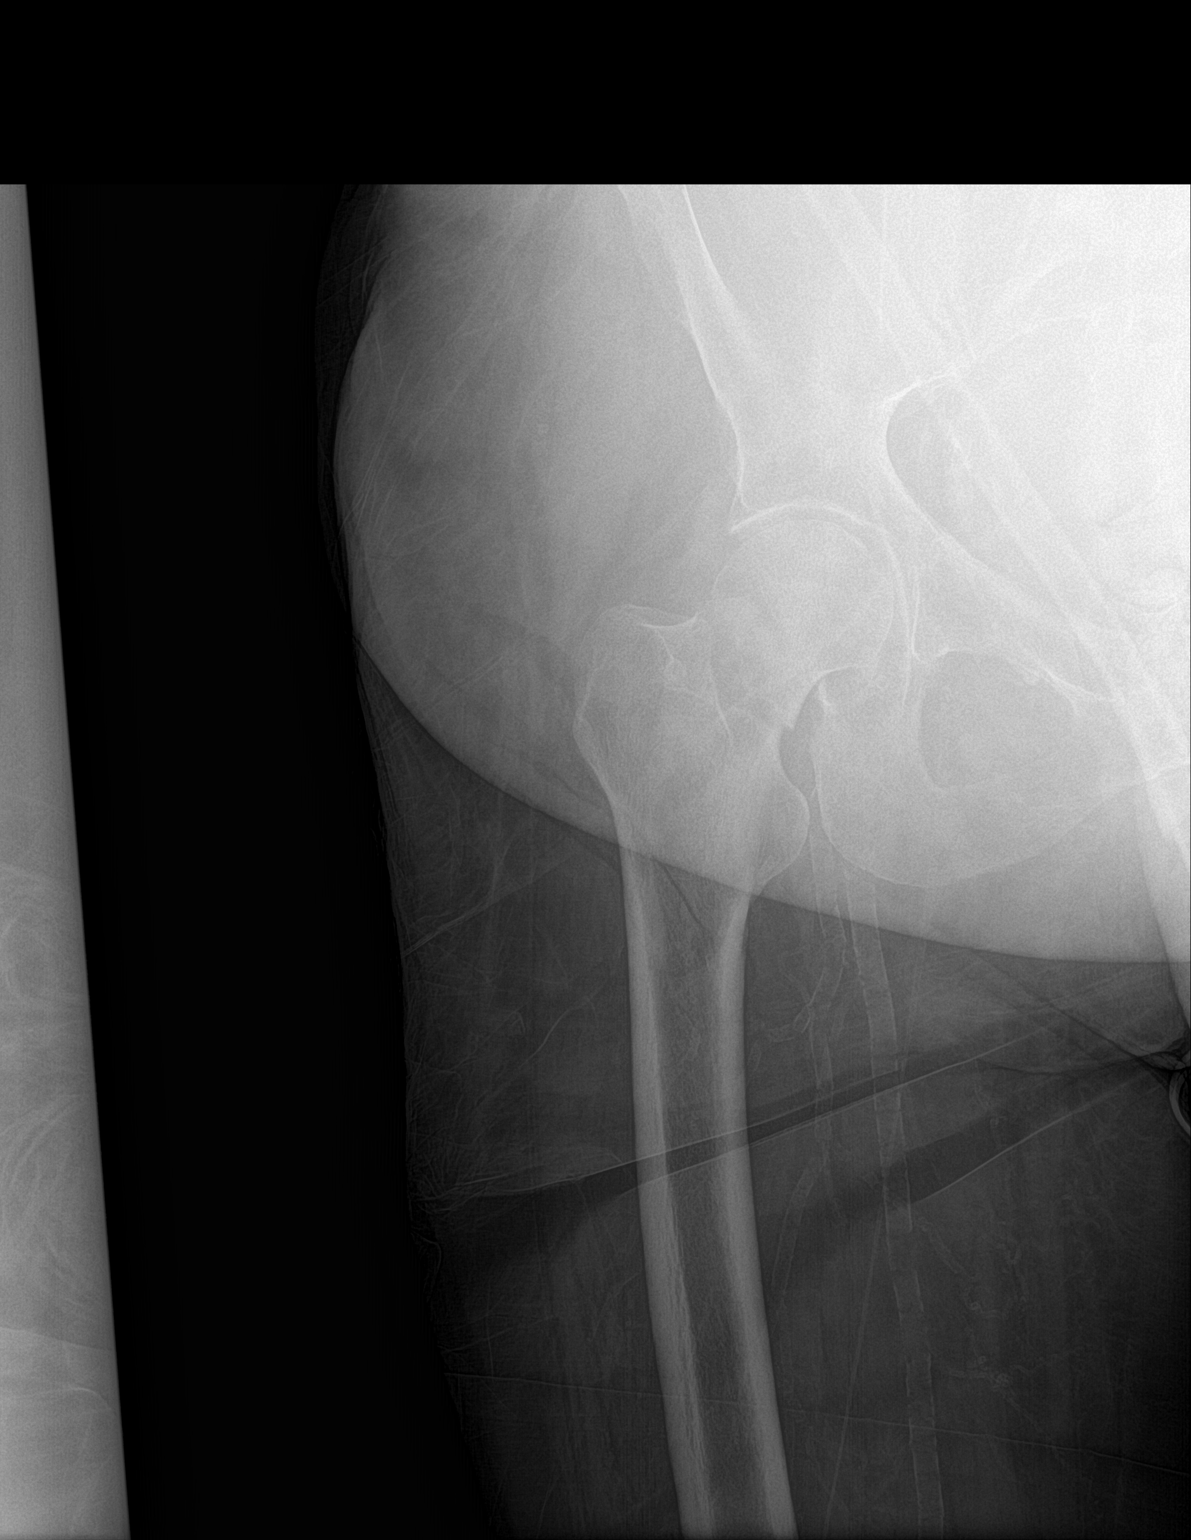

[femur ap (2 of 2)]
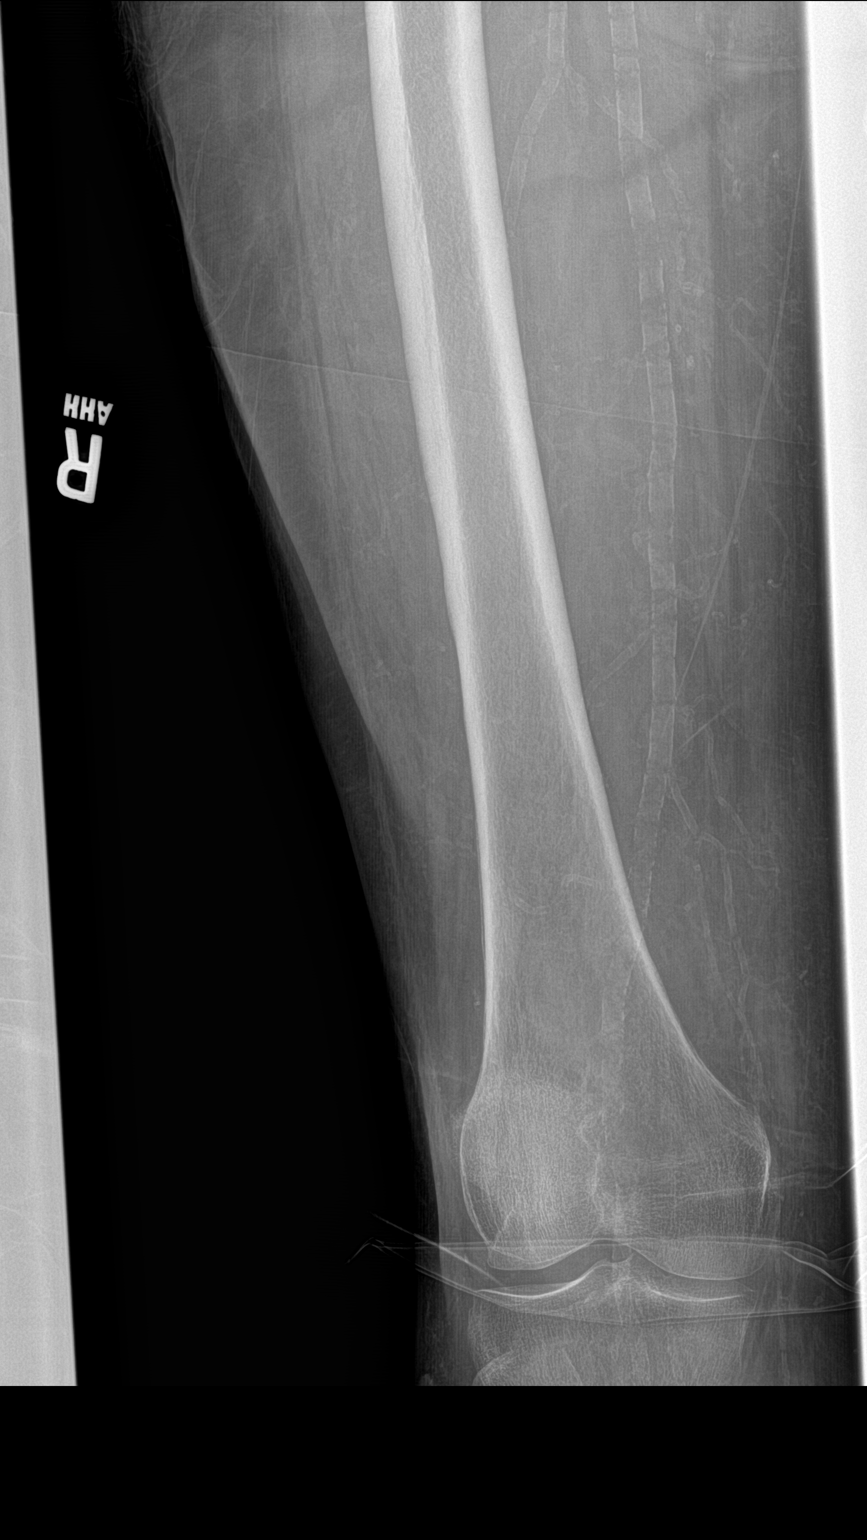

[femur lat]
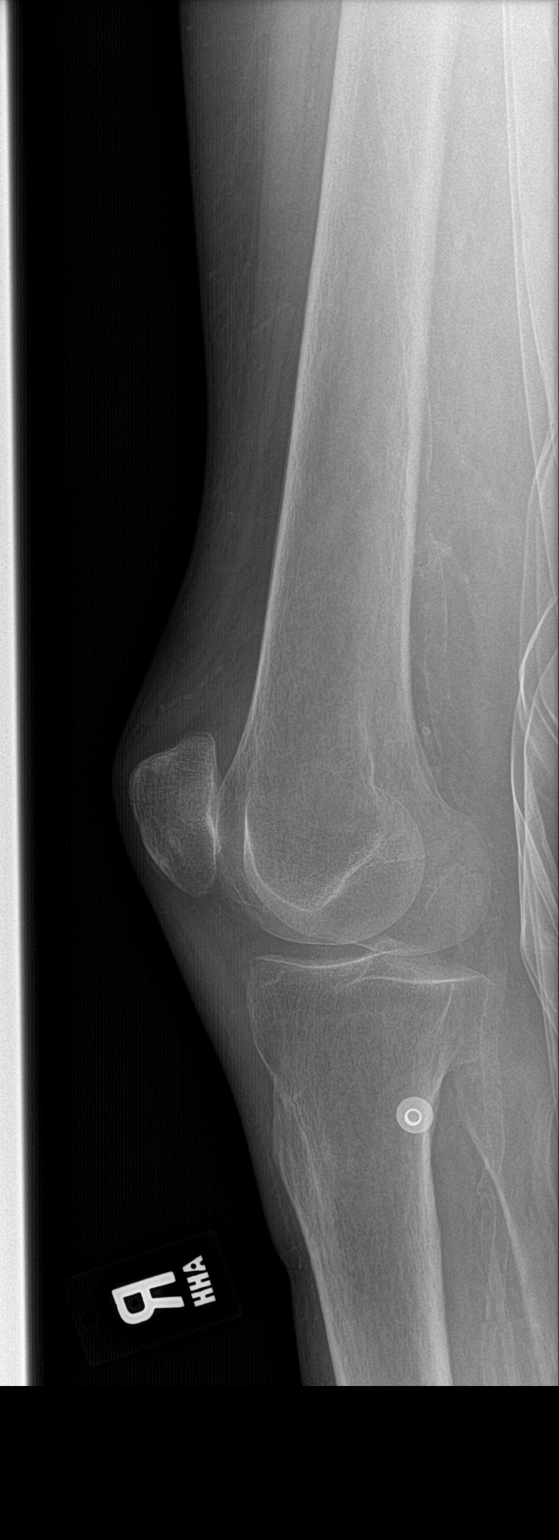

[hip lat]
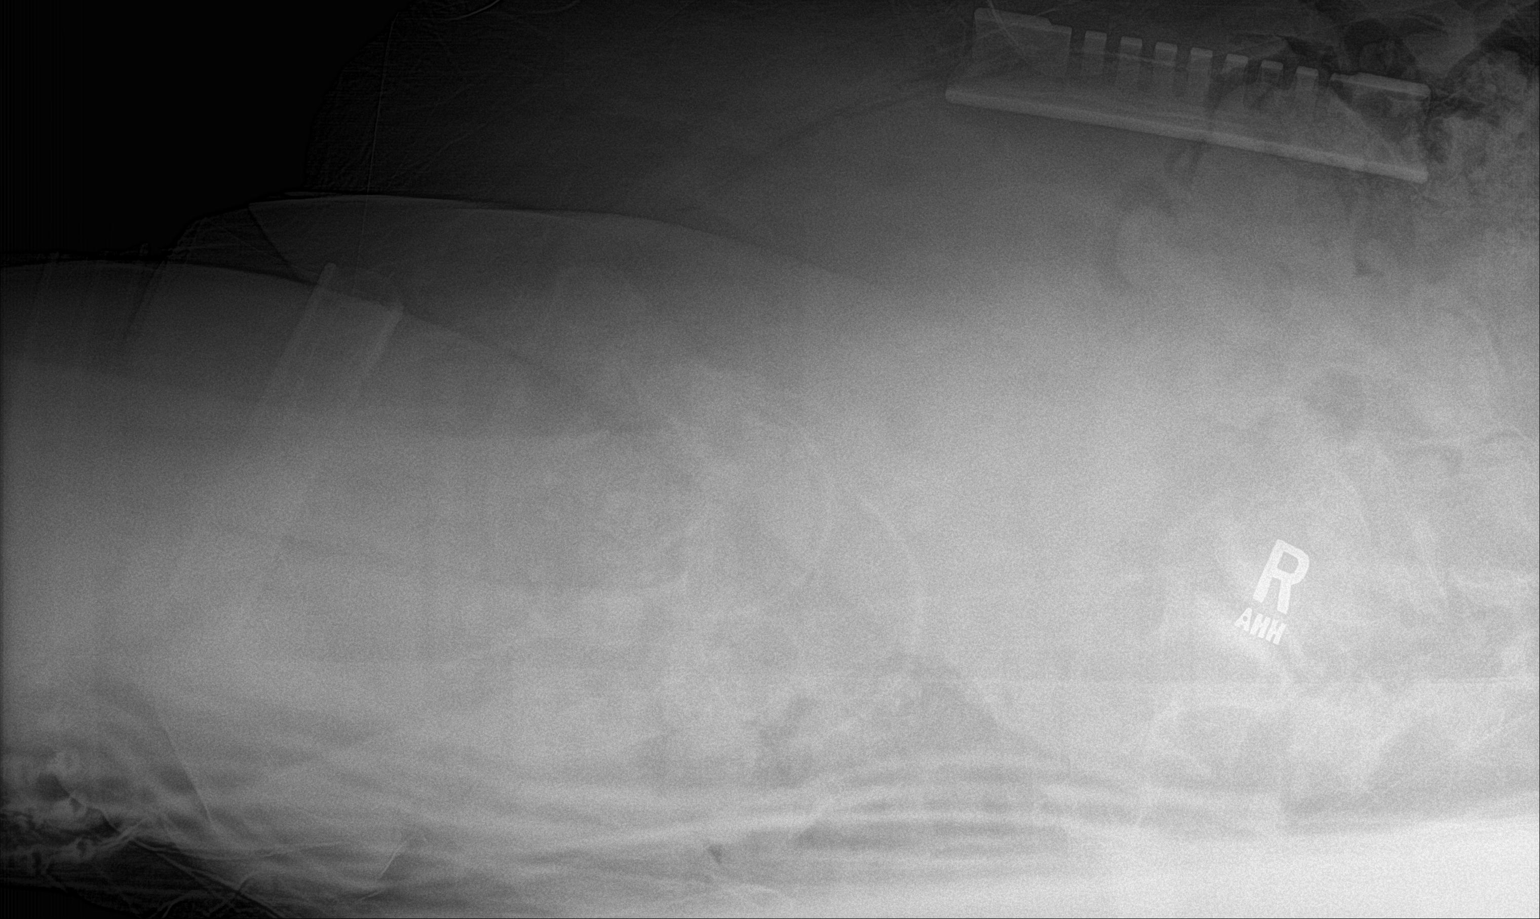

[4 of 4 positions shown; findings below may reference images not displayed]

FINDINGS: A minimally displaced basicervical fracture of the right femur is
noted with approximately 2 mm of lateral displacement of the distal
fracture fragment. An additional linear lucency is seen just below
the level of the trochanters on the AP view that may also represent
a nondisplaced fracture involving the subtrochanteric proximal
femur. Dedicated right hip radiographs or CT may help for better
assessment. A faint linear lucency is also seen involving the
superior pubic ramus possibly due to overlying bowel. Fracture is
not excluded. No distal femoral fracture or malalignment of the knee
joint is seen.
IMPRESSION: 1. An acute basicervical fracture of the right femoral neck with
minimal lateral displacement by 1-2 mm. Additional suspicious
lucencies are seen in the sub trochanteric portion of the femur and
right superior pubic ramus that may also represent nondisplaced
fractures or Mach lines from adjacent bowel or skin fold artifacts.
Dedicated right hip radiographs may help or CT for better
assessment.
2. No apparent fracture of the mid to distal femur nor fracture
about the right knee.

## 2019-06-19 IMAGING — DX DG CHEST 1V PORT
1 series · 1 of 1 positions shown · non-contrast
Comparison: None.

CLINICAL DATA: Cough for 2 days

EXAM:
PORTABLE CHEST 1 VIEW

[chest]
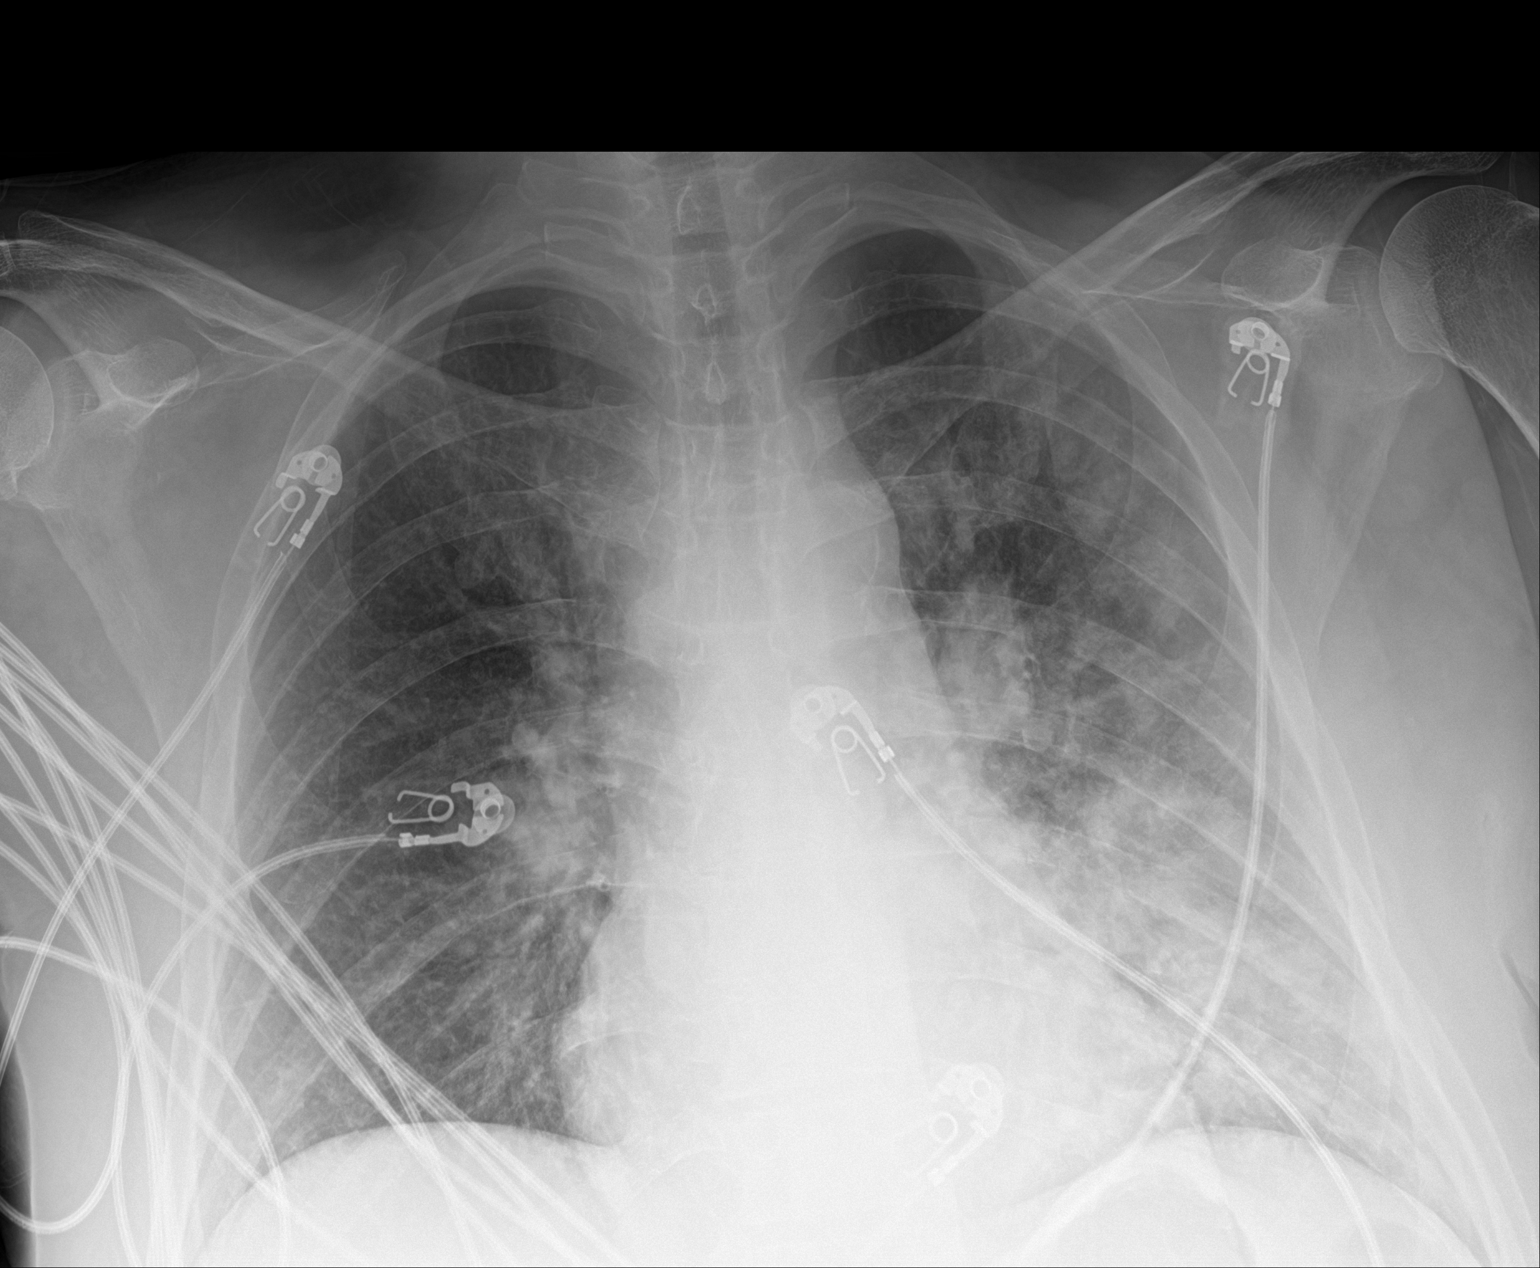

[1 of 1 positions shown; findings below may reference images not displayed]

FINDINGS: There is left upper lobe and left lower lobe airspace disease most
concerning for pneumonia. There is no pleural effusion or
pneumothorax. The heart and mediastinal contours are unremarkable.

The osseous structures are unremarkable.
IMPRESSION: Left upper lobe and left lower lobe pneumonia. Followup PA and
lateral chest X-ray is recommended in 3-4 weeks following trial of
antibiotic therapy to ensure resolution and exclude underlying
malignancy.

## 2019-08-24 IMAGING — CT CT ABD-PELV W/O CM
2 of 4 series · 16 of 46 positions shown, 18 images · non-contrast
Comparison: 04/10/2016

CLINICAL DATA: BILATERAL flank and back pain question kidney stone,
history of renal transplant, diabetes mellitus, former smoker

EXAM:
CT ABDOMEN AND PELVIS WITHOUT CONTRAST
TECHNIQUE: Multidetector CT imaging of the abdomen and pelvis was performed
following the standard protocol without IV contrast. Sagittal and
coronal MPR images reconstructed from axial data set.

[Series 2: axial st · axial · 0.89mm/px · z∈[-906,-461]mm · 13 of 101 slices shown, 15 images]
[im 6/101  soft-tissue]
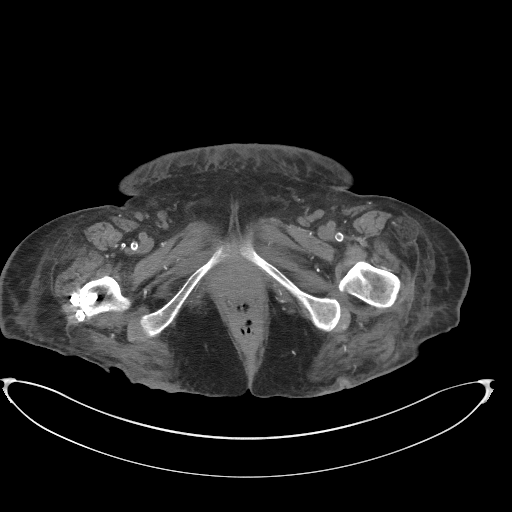
[im 6/101  bone]
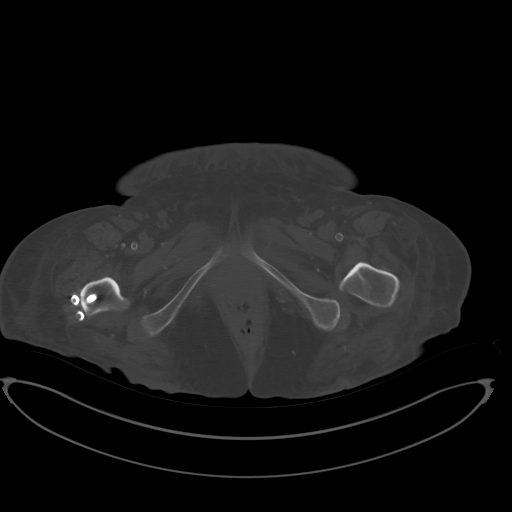
[im 12/101  soft-tissue]
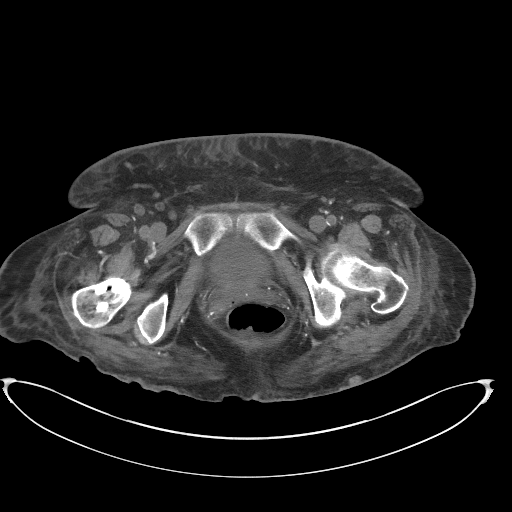
[im 23/101  soft-tissue]
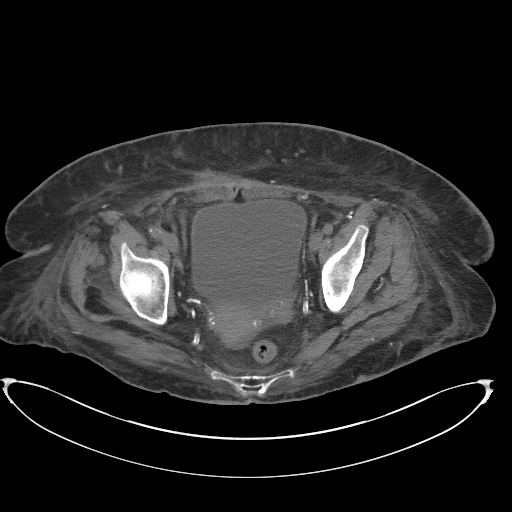
[im 28/101  soft-tissue]
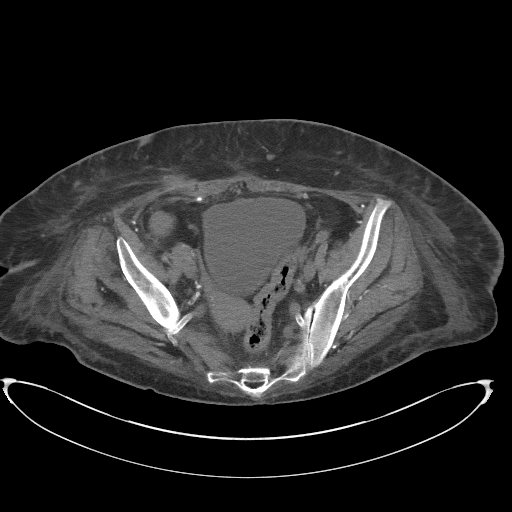
[im 34/101  soft-tissue]
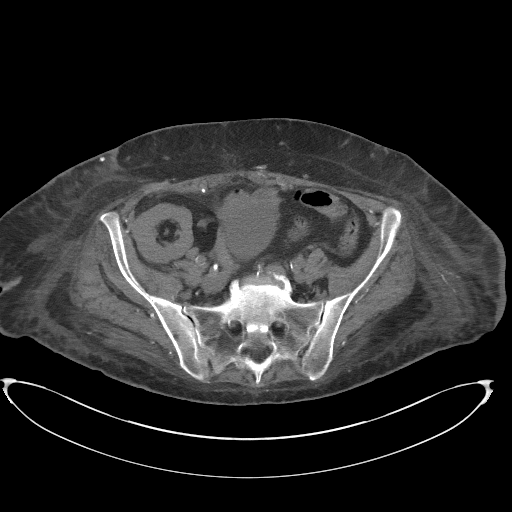
[im 45/101  soft-tissue]
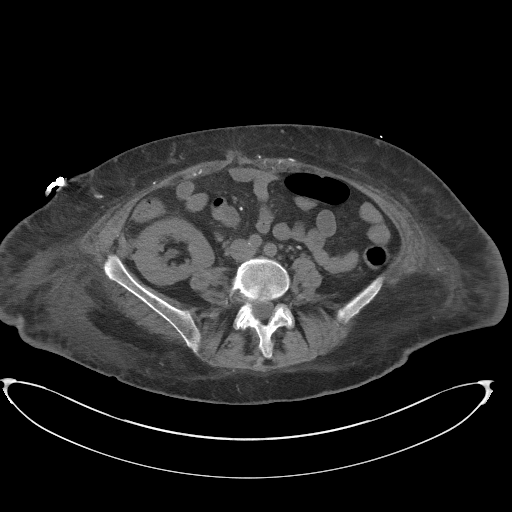
[im 51/101  soft-tissue]
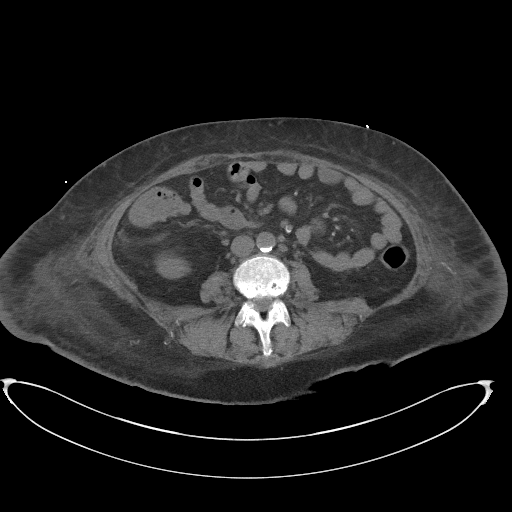
[im 56/101  soft-tissue]
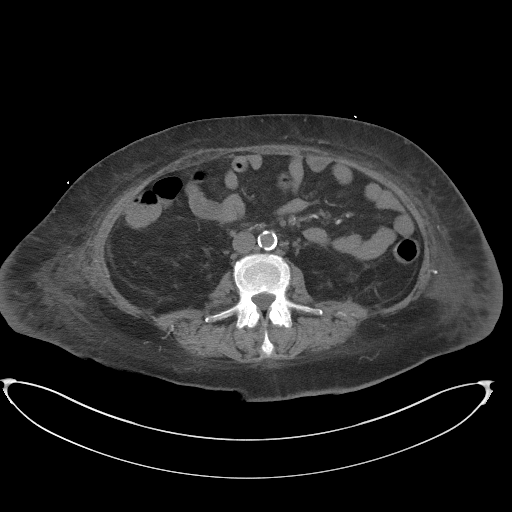
[im 67/101  soft-tissue]
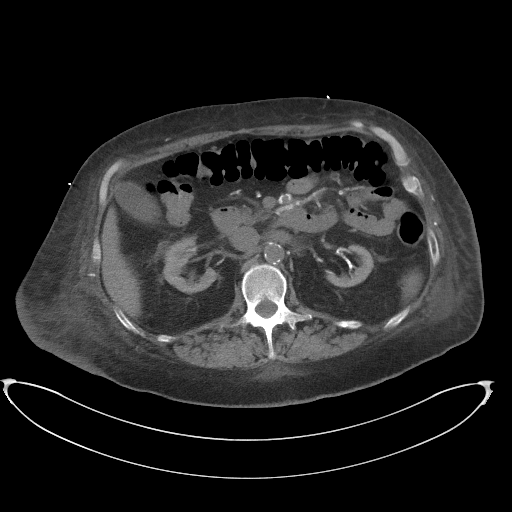
[im 67/101  bone]
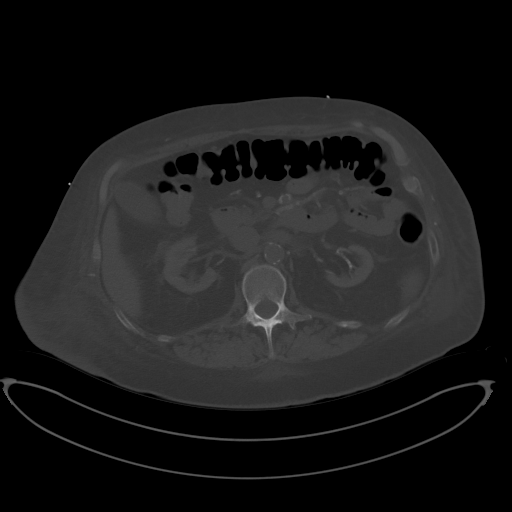
[im 73/101  soft-tissue]
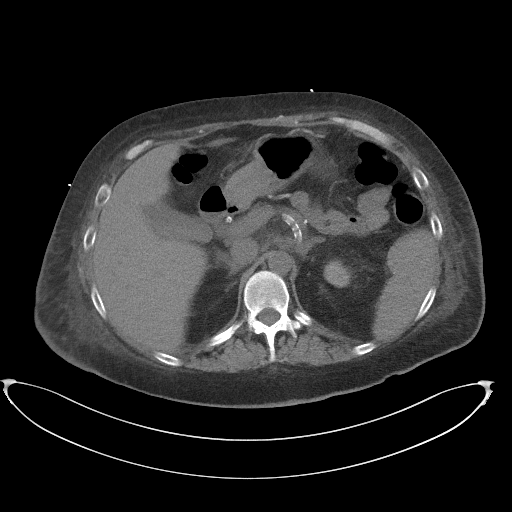
[im 78/101  soft-tissue]
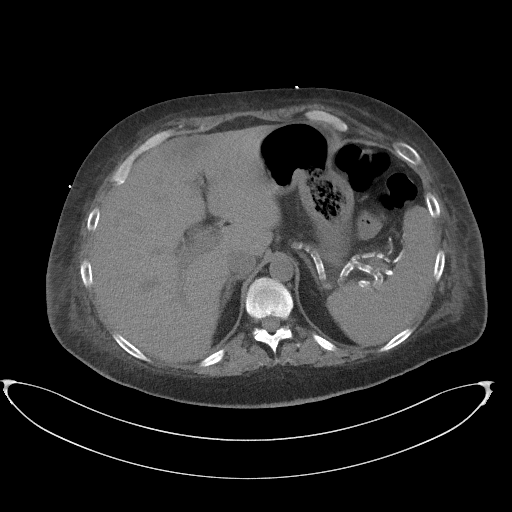
[im 89/101  soft-tissue]
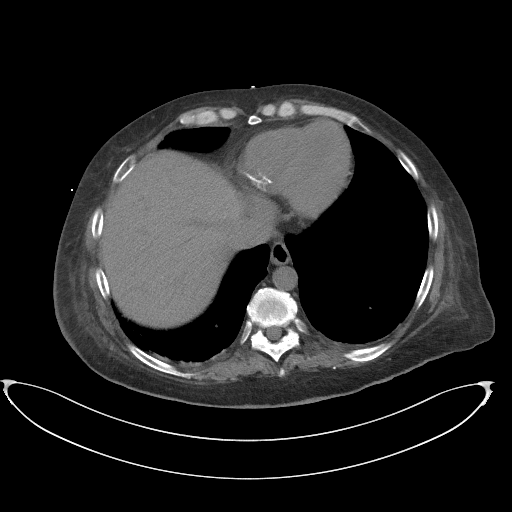
[im 95/101  soft-tissue]
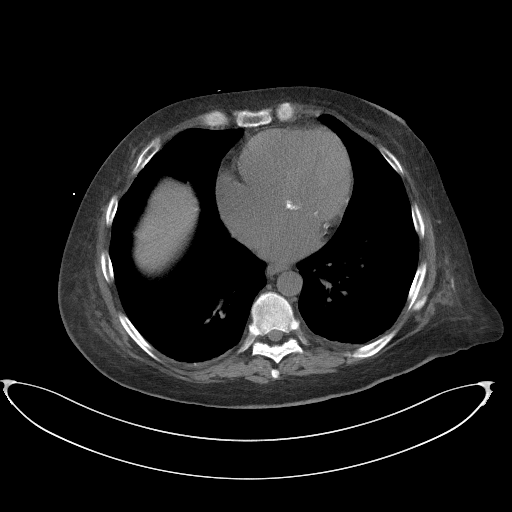

[Series 4: coronal st · coronal · 0.95mm/px · 3 of 108 slices shown]
[im 36/108  soft-tissue]
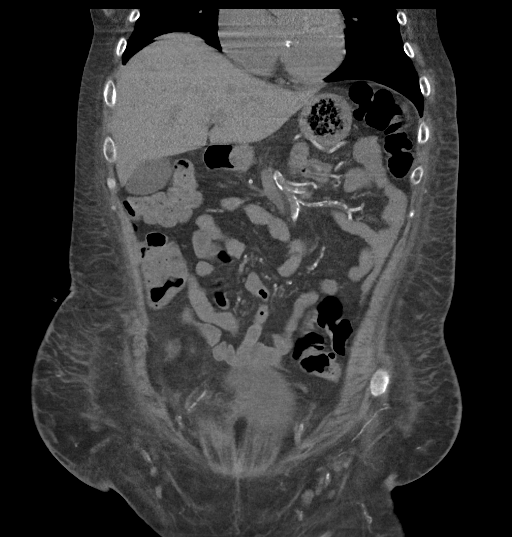
[im 48/108  soft-tissue]
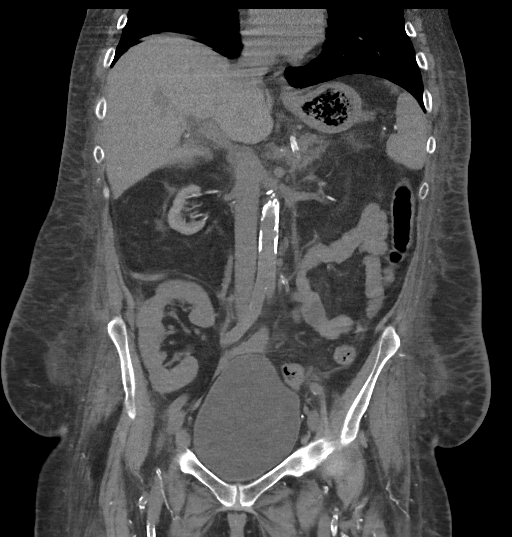
[im 60/108  soft-tissue]
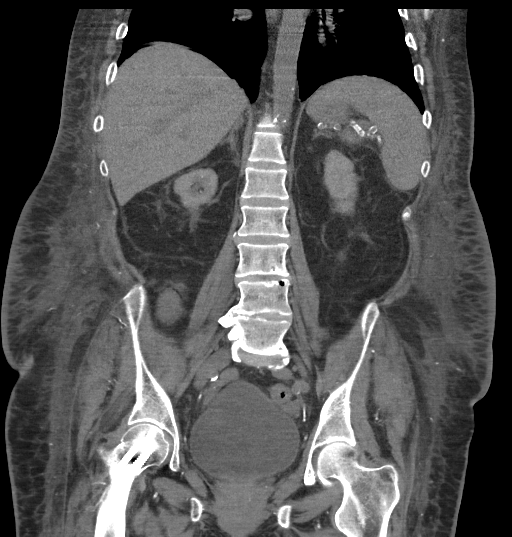

[16 of 46 positions shown; findings below may reference images not displayed]

FINDINGS: Lower chest: Subsegmental atelectasis at LEFT base.

Hepatobiliary: Focal fatty infiltration of the liver. Otherwise
normal appearance of liver and gallbladder.

Pancreas: Atrophic, grossly unremarkable

Spleen: Normal appearance

Adrenals/Urinary Tract: Thickening of adrenal glands without
discrete mass. Markedly atrophic native kidneys. Transplant kidney
RIGHT iliac fossa without gross mass or hydronephrosis. Transplant
ureter and bladder unremarkable.

Stomach/Bowel: Stomach and bowel loops unremarkable appearance for
technique.

Vascular/Lymphatic: Extensive atherosclerotic calcifications of both
abdominal aorta and multiple branch vessels. Aorta normal caliber.
No adenopathy.

Reproductive: Normal appearing uterus and adnexa

Other: No free air or free fluid. Diffuse soft tissue edema both of
abdominal wall and intra-abdominal planes question related to fluid
overload or hypoproteinemia. Tiny umbilical hernia containing fat.

Musculoskeletal: Diffusely demineralized. Degenerative disc disease
changes lumbar spine. Cannulated screws at proximal RIGHT femur.
IMPRESSION: No acute intra-abdominal or intrapelvic abnormalities.

Atrophic native kidneys with unremarkable appearing transplant
kidney in RIGHT iliac fossa.

Scattered soft tissue edema question related to fluid overload or
hypoproteinemia.

## 2019-08-25 IMAGING — DX DG CHEST 1V PORT
1 series · 1 of 1 positions shown · non-contrast
Comparison: CT Abdomen and Pelvis 11/05/2017. 09/01/2017 and
earlier.

CLINICAL DATA: 51-year-old female admitted with progressive lower
extremity edema, pain and erythema. Acute on chronic renal failure
status post renal transplant.

EXAM:
PORTABLE CHEST 1 VIEW

[chest ap]
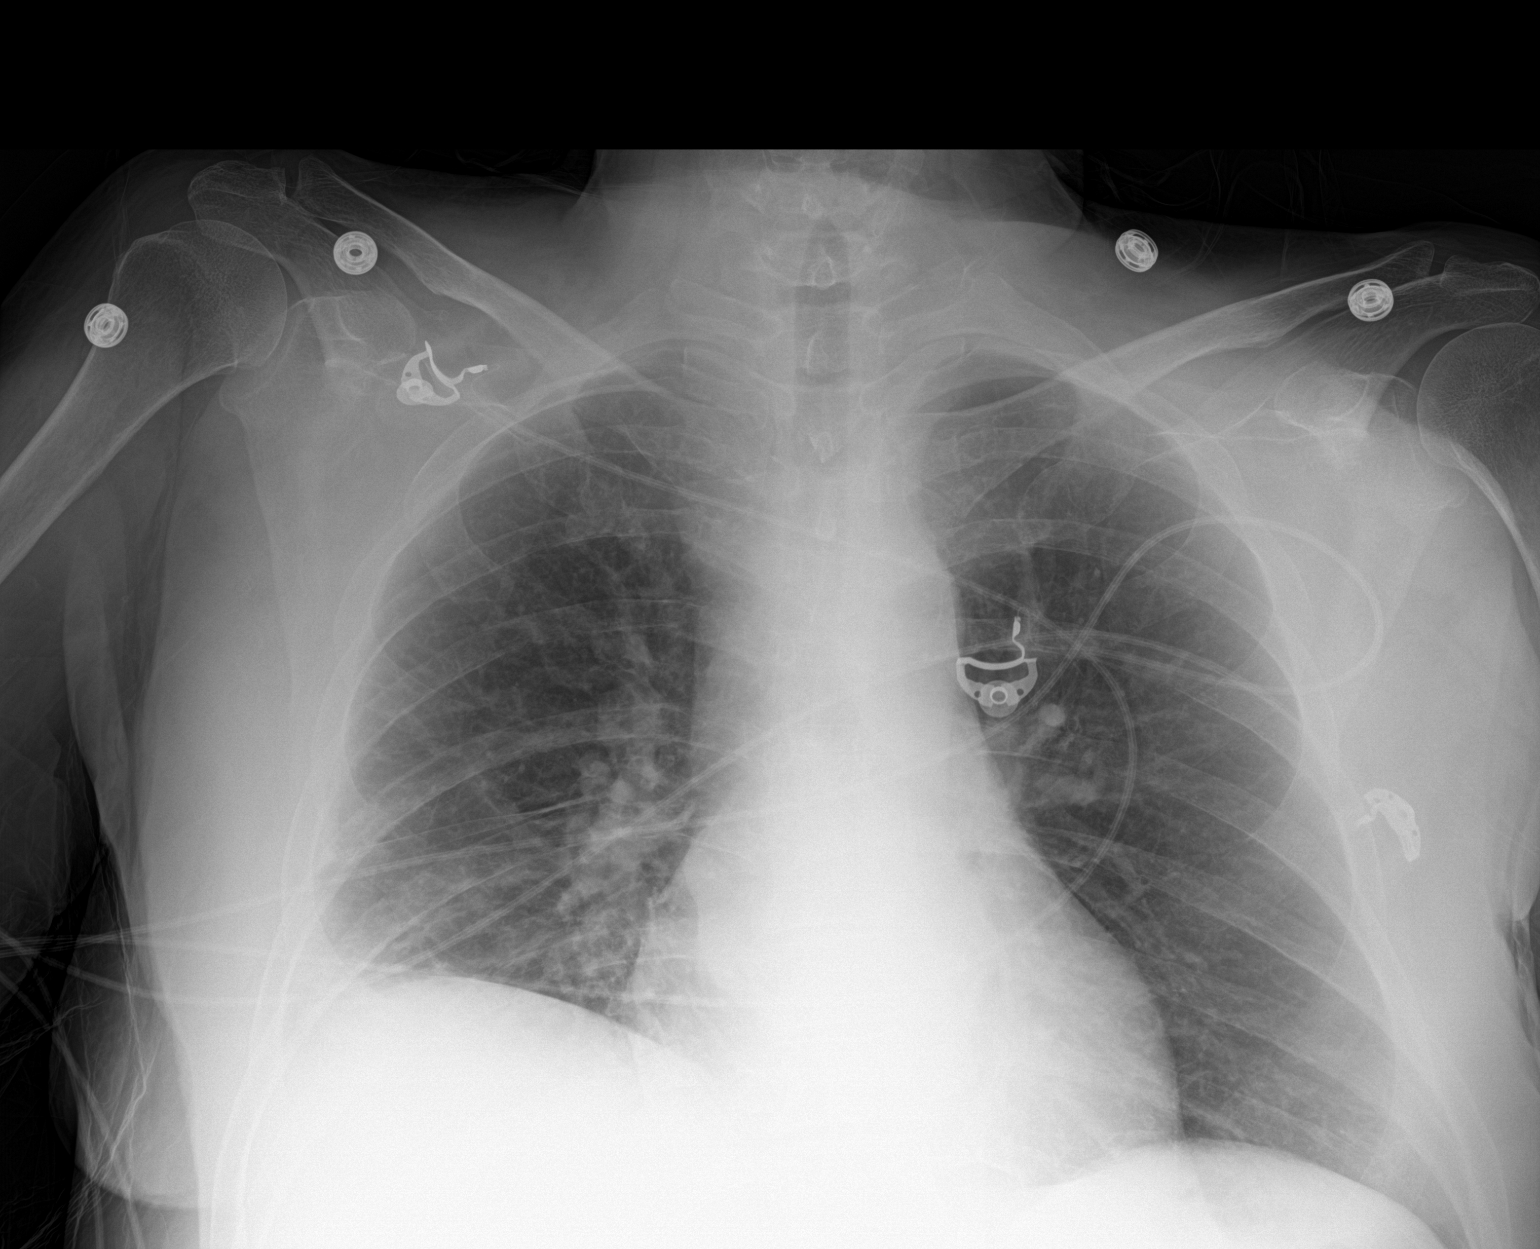

[1 of 1 positions shown; findings below may reference images not displayed]

FINDINGS: Portable AP semi upright view at 2230 hours. Mild elevation of the
right hemidiaphragm. Allowing for portable technique the lungs are
clear. Mediastinal contours remain normal. Visualized tracheal air
column is within normal limits. No acute osseous abnormality
identified.
IMPRESSION: No acute cardiopulmonary abnormality.

## 2019-08-27 IMAGING — MR MR HIP*R* W/O CM
4 of 5 series · 29 of 40 positions shown · non-contrast
Comparison: Lower extremity CT 11/07/2017.  Pelvic CT 11/05/2017.

CLINICAL DATA: Bilateral lower extremity pain, swelling and
erythema for several days. Patient reports chronic lower extremity
edema. History of chronic kidney disease, diabetes, renal transplant
and hypertension.

EXAM:
MR OF THE RIGHT HIP WITHOUT CONTRAST
TECHNIQUE: Multiplanar, multisequence MR imaging was performed. No intravenous
contrast was administered.

[Series 4: cor ir (pelvis) · coronal · 4.0mm · 0.82mm/px · 6 of 26 slices shown]
[im 1/26]
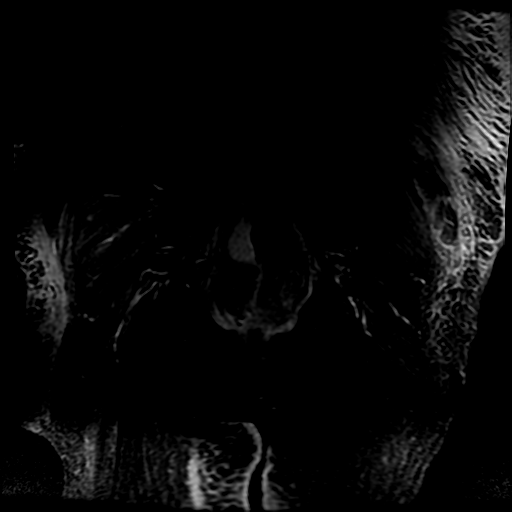
[im 4/26]
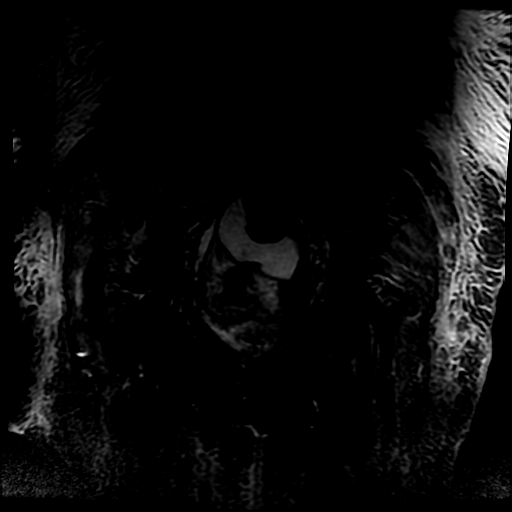
[im 8/26]
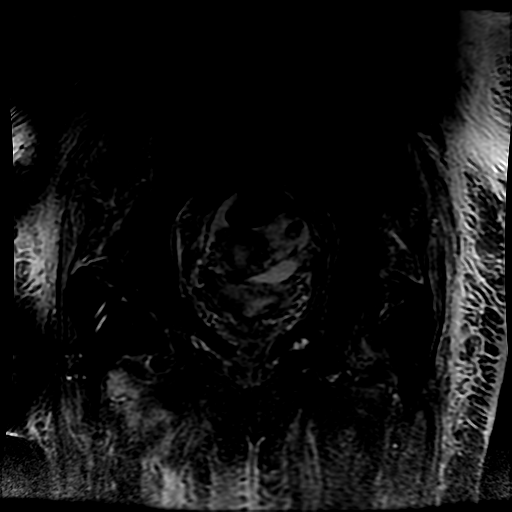
[im 11/26]
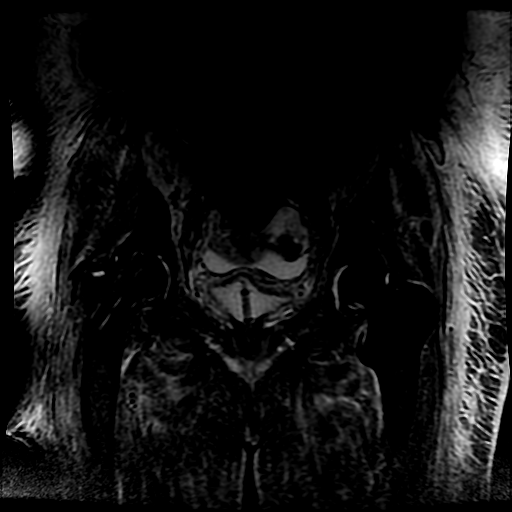
[im 15/26]
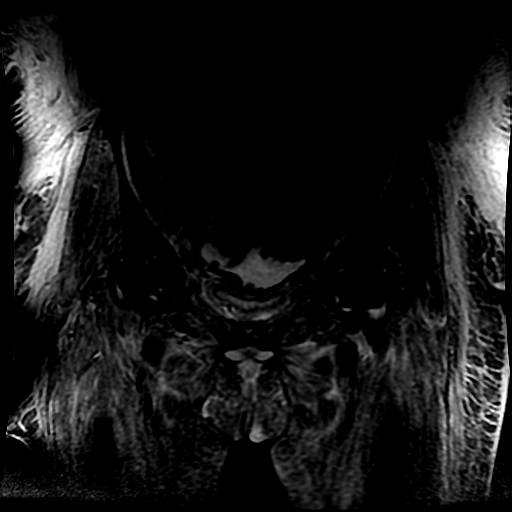
[im 22/26]
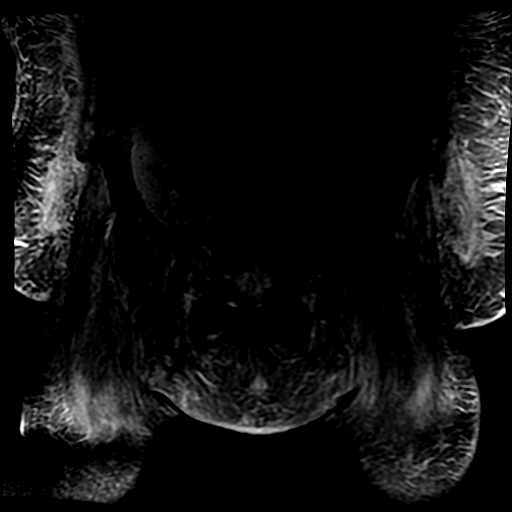

[Series 5: T1 · coronal · 4.0mm · 1.64mm/px · 7 of 26 slices shown (1 of 2)]
[im 1/26]
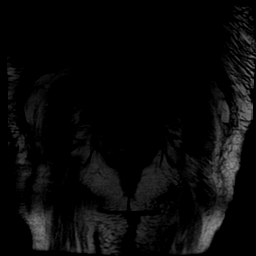
[im 5/26]
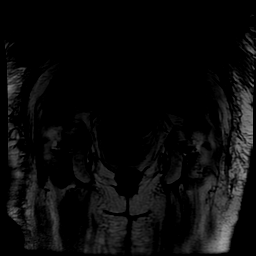
[im 9/26]
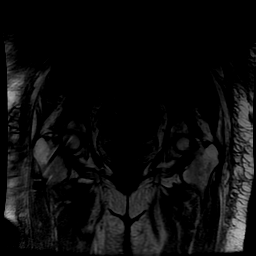
[im 13/26]
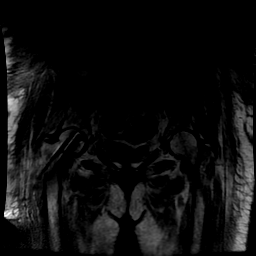
[im 17/26]
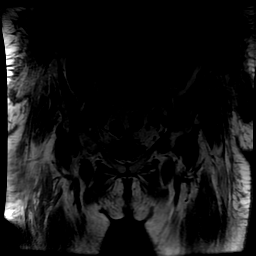
[im 21/26]
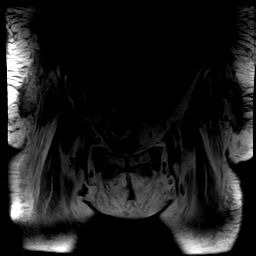
[im 26/26]
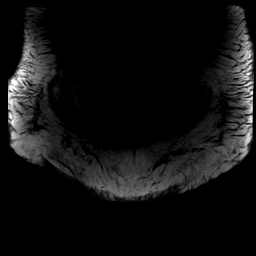

[Series 6: T1 · axial · 4.0mm · 1.48mm/px · z∈[-206,-51]mm · 9 of 32 slices shown (2 of 2)]
[im 1/32]
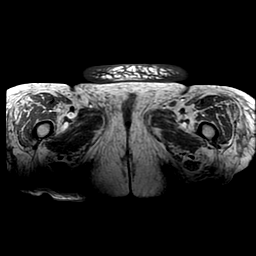
[im 4/32]
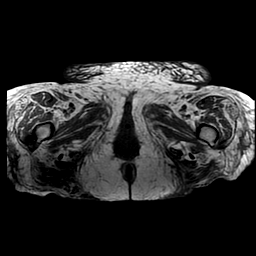
[im 8/32]
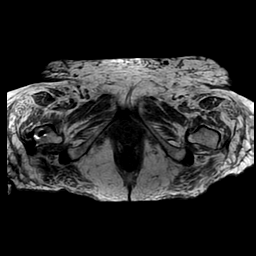
[im 12/32]
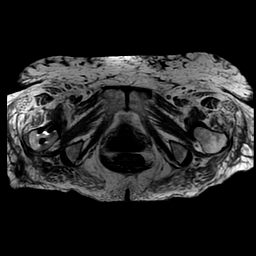
[im 16/32]
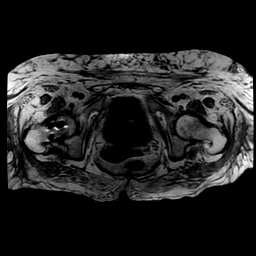
[im 20/32]
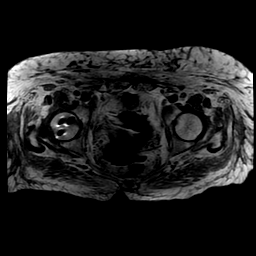
[im 24/32]
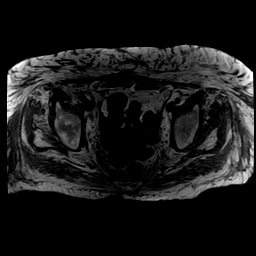
[im 28/32]
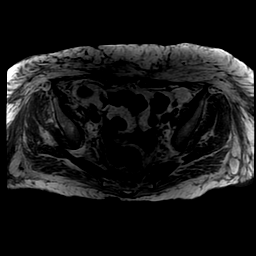
[im 32/32]
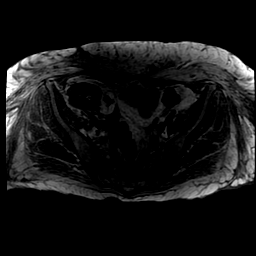

[Series 8: PD · sagittal · 4.0mm · 0.31mm/px · 7 of 24 slices shown]
[im 1/24]
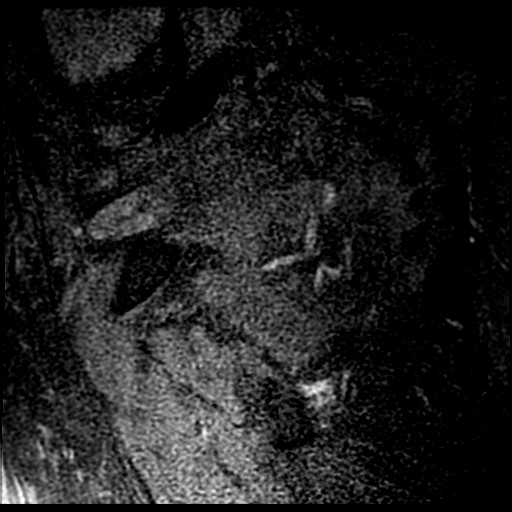
[im 4/24]
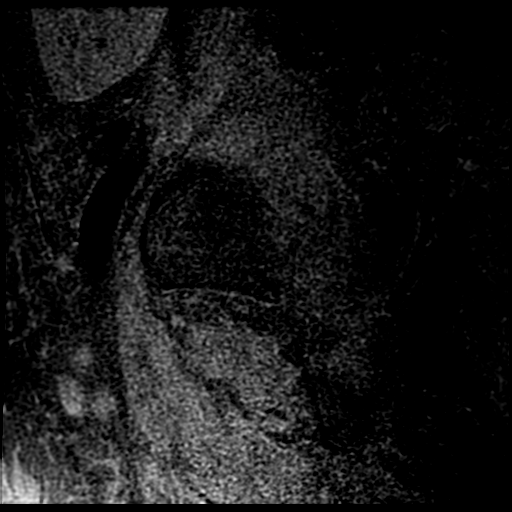
[im 8/24]
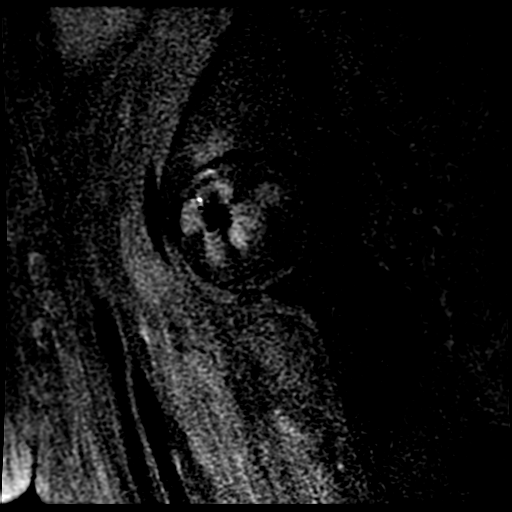
[im 12/24]
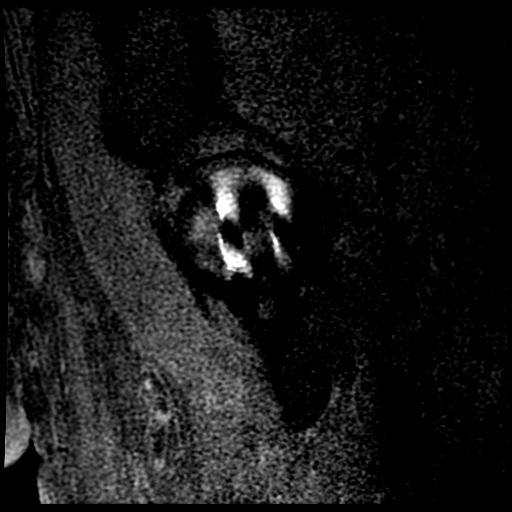
[im 16/24]
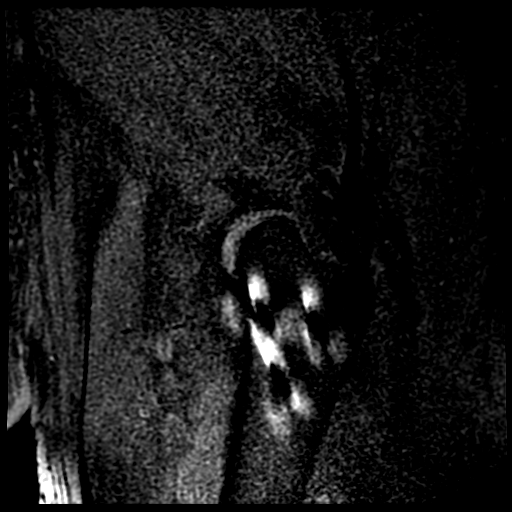
[im 20/24]
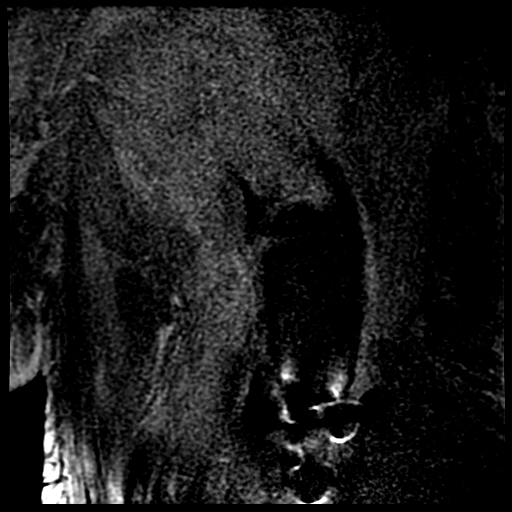
[im 24/24]
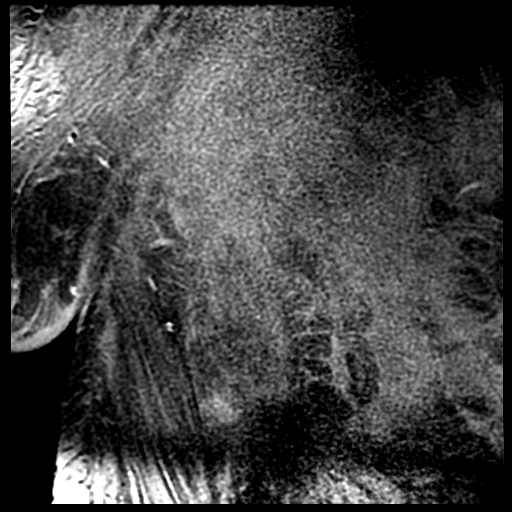

[29 of 40 positions shown; findings below may reference images not displayed]

FINDINGS: Bones: Status post fixation of the right femoral neck with 3
cortical screws. The hardware is in stable position. No displaced
fracture. There is ill-defined low T1 and high T2 signal in the left
superior pubic ramus, best seen on coronal images 17 and 18, likely
a nondisplaced fracture. The inferior pubic ramus appears intact.
The sacrum and sacroiliac joints appear intact. There are mild
degenerative changes of both hips with small bilateral hip joint
effusions. No evidence of femoral head avascular necrosis.

Articular cartilage and labrum

Articular cartilage:  No focal chondral defect.

Labrum:  No gross labral tear or paralabral cyst.

Joint or bursal effusion

Joint effusion:  Small bilateral hip joint effusions.

Bursae: No focal bursal fluid collection.

Muscles and tendons

Muscles and tendons: Generalized edema throughout the visualized
pelvic and proximal thigh musculature. No focal intramuscular fluid
collection or acute tendon findings identified.

Other findings

Miscellaneous: As on previous CT, generalized anasarca with diffuse
edema throughout the subcutaneous and pelvic fat. Small amount of
pelvic ascites. Transplant kidney in the right iliac fossa appears
unchanged.
IMPRESSION: 1. Nonspecific generalized soft tissue edema involving the
musculature and subcutaneous and deep fat, consistent with
nonspecific anasarca. Ascites without focal fluid collection.
2. Previous right hip pinning without displaced fracture. Marrow
edema in the left superior pubic ramus suspicious for a nondisplaced
fracture. The pelvis otherwise appears intact.

## 2019-08-28 IMAGING — DX DG HIP (WITH OR WITHOUT PELVIS) 1V*R*
2 series · 2 of 2 positions shown · non-contrast
Comparison: 06/02/2017 right hip radiographs. 11/07/2017 bilateral
lower extremity CT.

CLINICAL DATA: History of right femoral neck fracture status post
ORIF. Patient reports right hip pain.

EXAM:
DG HIP (WITH OR WITHOUT PELVIS) 1V RIGHT

[pelvis ap]
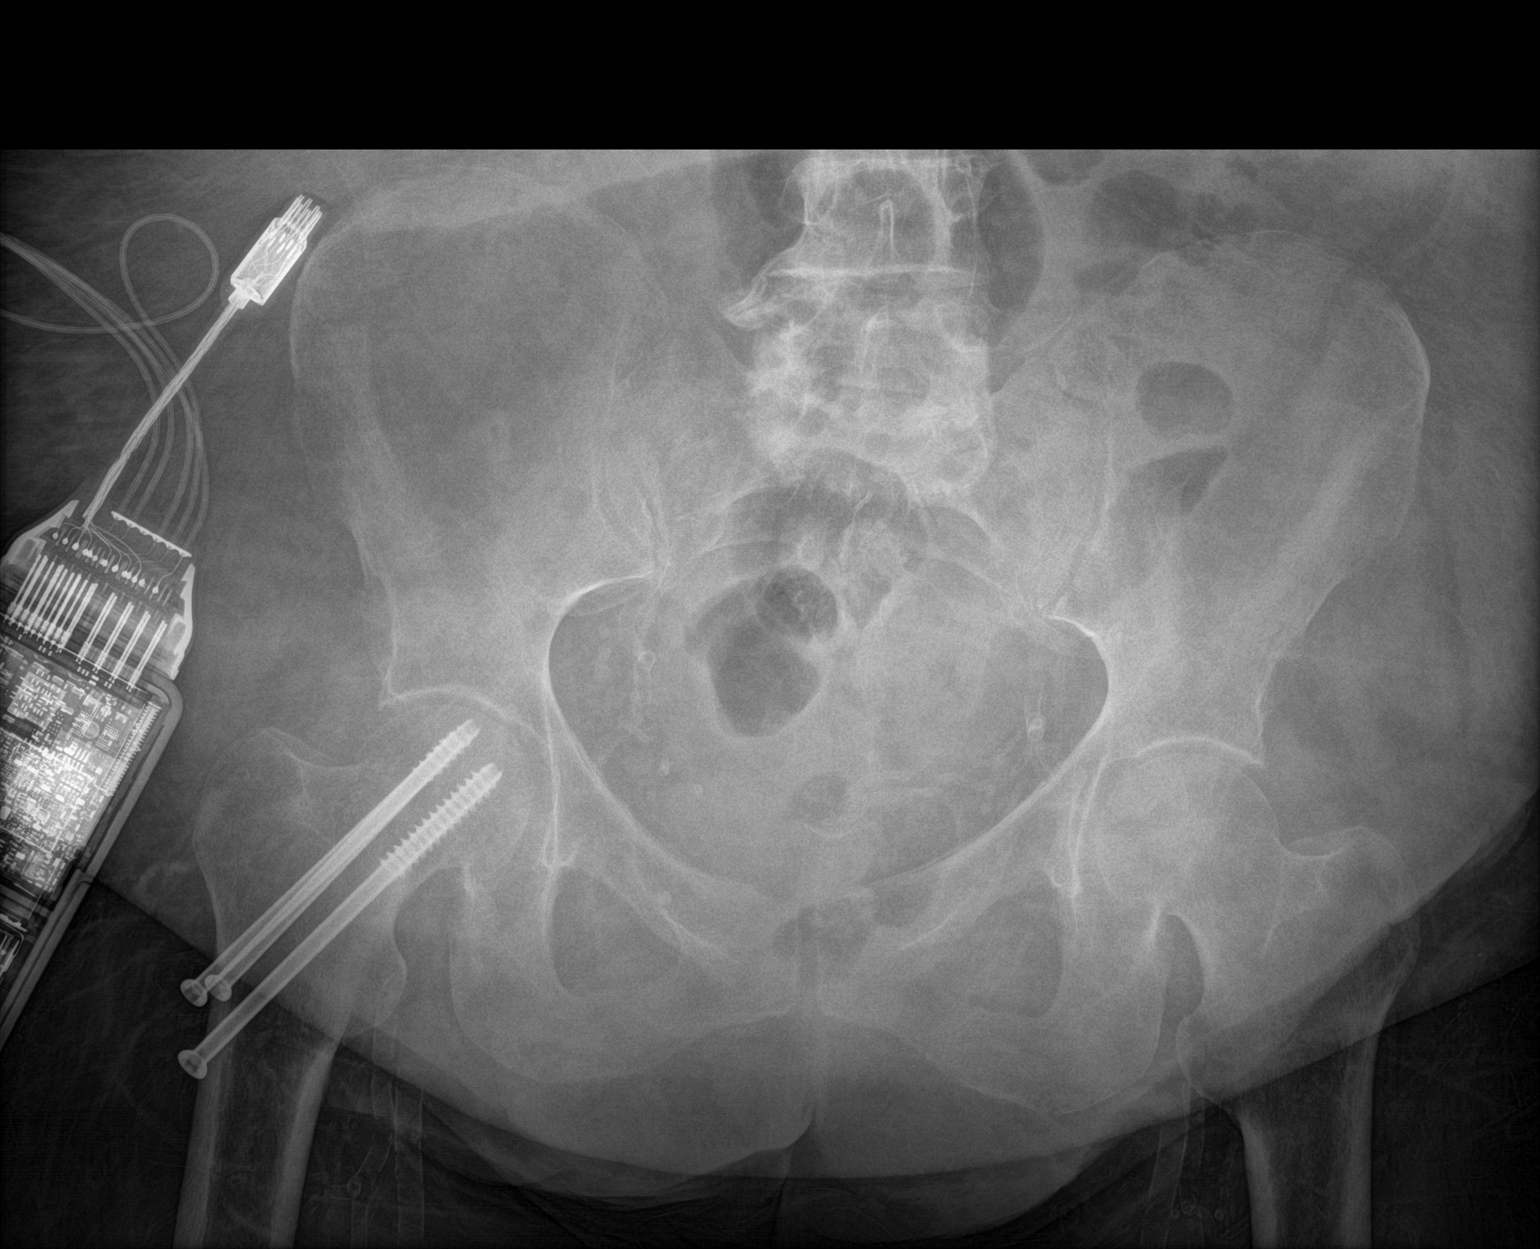

[hip ap]
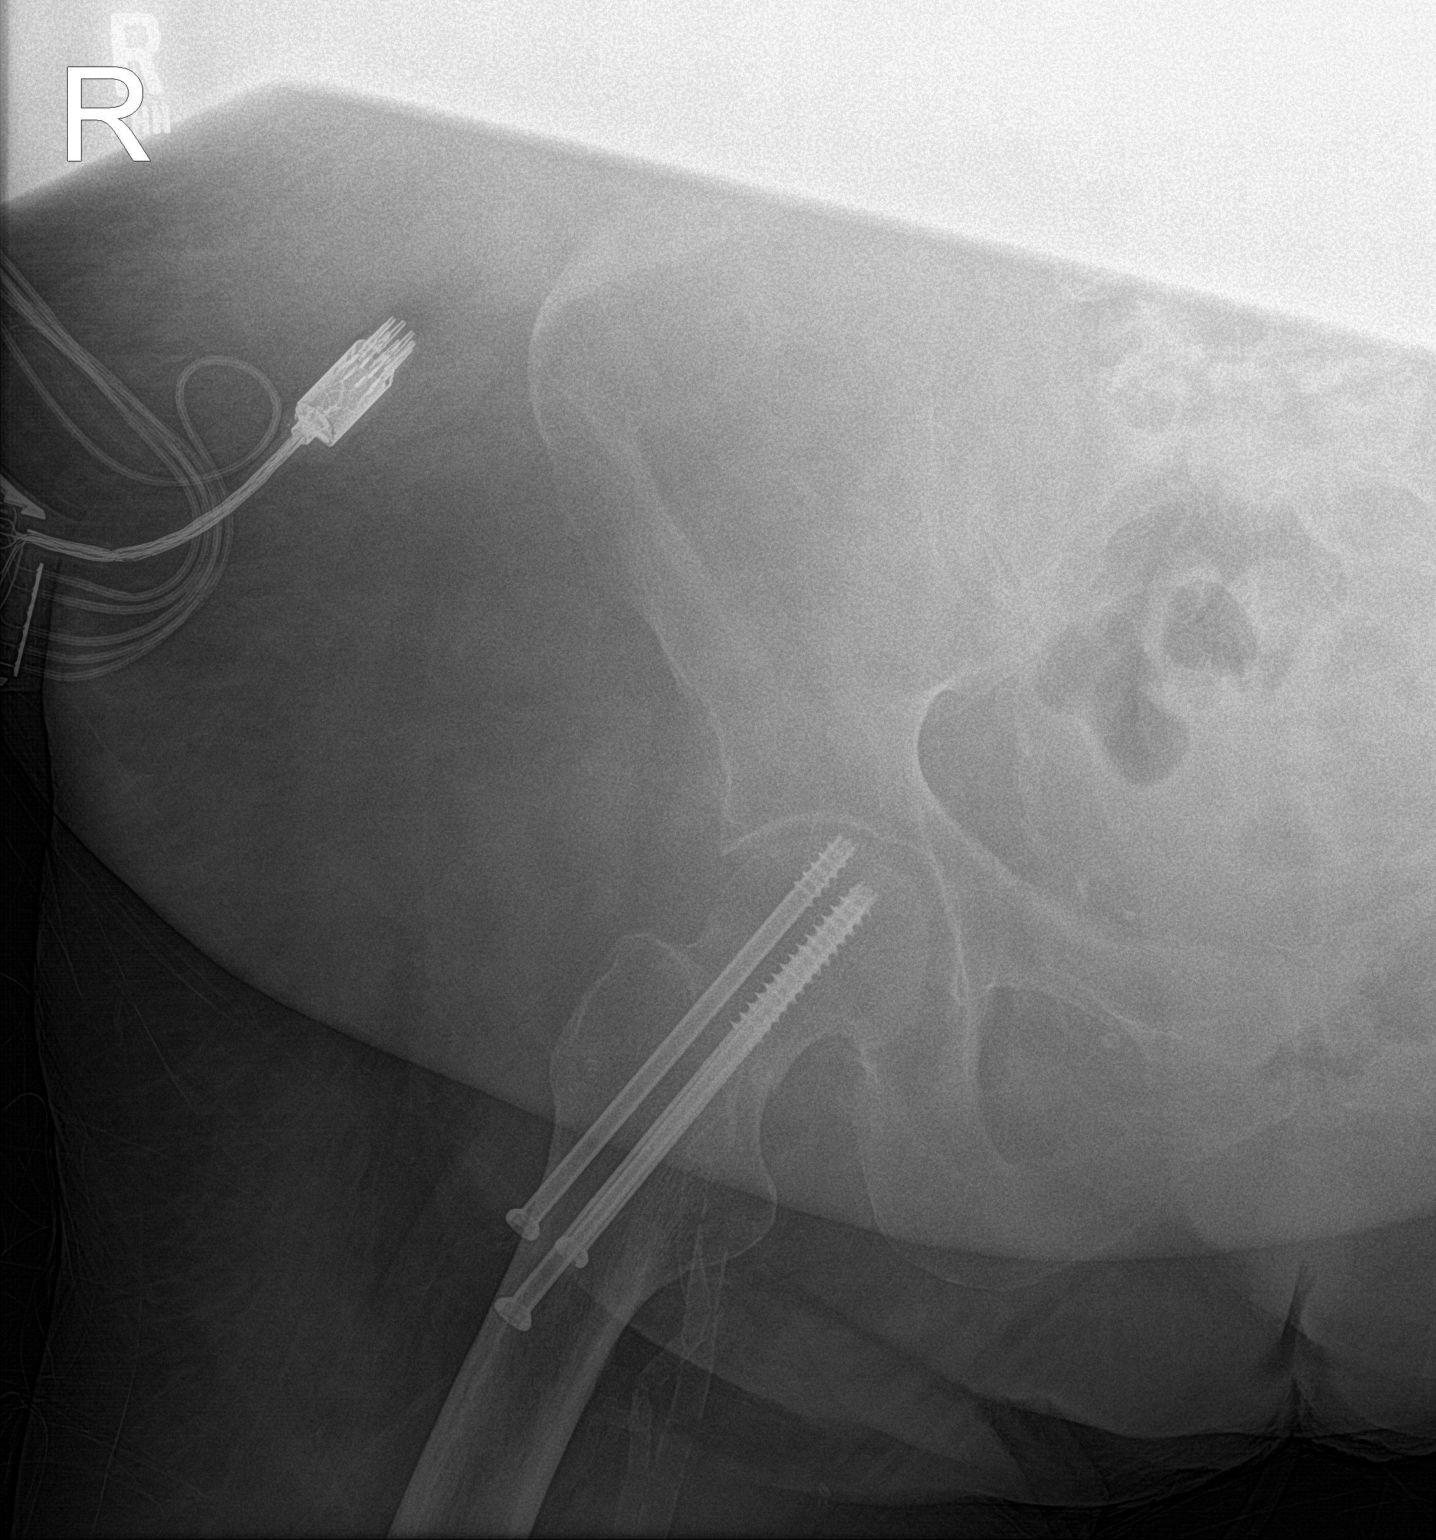

[2 of 2 positions shown; findings below may reference images not displayed]

FINDINGS: Nearly healed fracture of the right femoral neck in anatomic
alignment status post transfixation by 3 screws, with no evidence of
screw fracture or loosening. No additional fracture. No right hip
dislocation. No significant right hip arthropathy. Degenerative
changes in the visualized lower lumbar spine.
IMPRESSION: Nearly healed right femoral neck fracture in anatomic alignment
status post ORIF, with no evidence of hardware complication.

## 2019-08-30 IMAGING — CT CT RENAL STONE PROTOCOL
2 of 4 series · 16 of 46 positions shown, 18 images · non-contrast
Comparison: 11/05/2017

CLINICAL DATA: Renal failure and metabolic acidosis

EXAM:
CT ABDOMEN AND PELVIS WITHOUT CONTRAST
TECHNIQUE: Multidetector CT imaging of the abdomen and pelvis was performed
following the standard protocol without IV contrast.

[Series 2: axial st · axial · 0.89mm/px · z∈[-846,-401]mm · 13 of 101 slices shown, 15 images]
[im 6/101  soft-tissue]
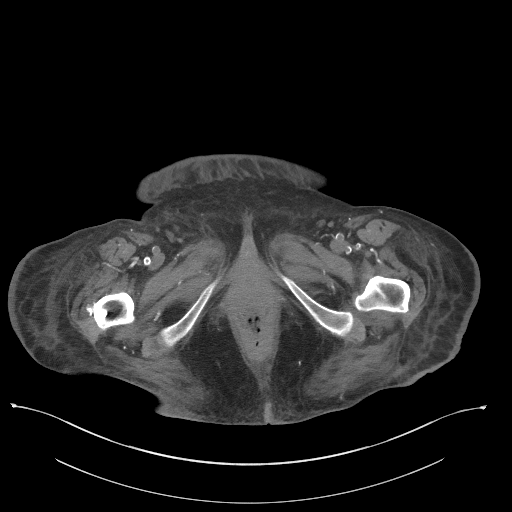
[im 6/101  bone]
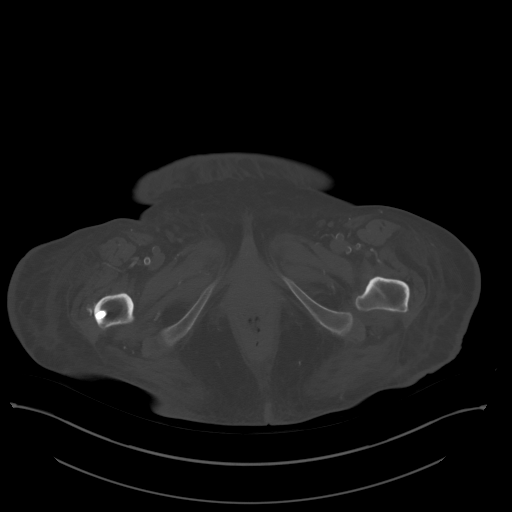
[im 16/101  soft-tissue]
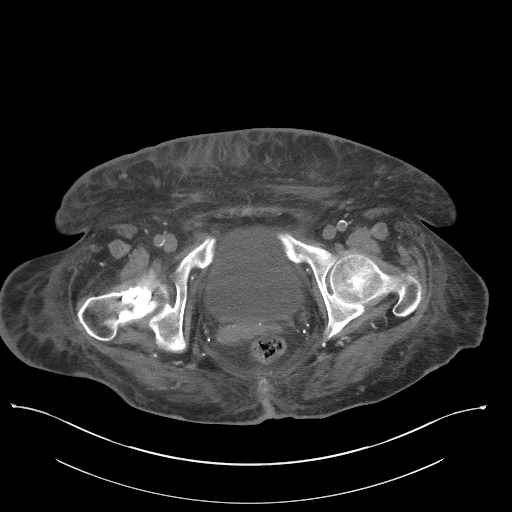
[im 22/101  soft-tissue]
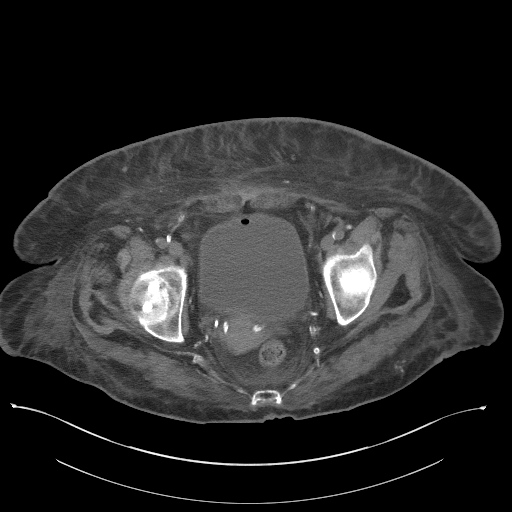
[im 27/101  soft-tissue]
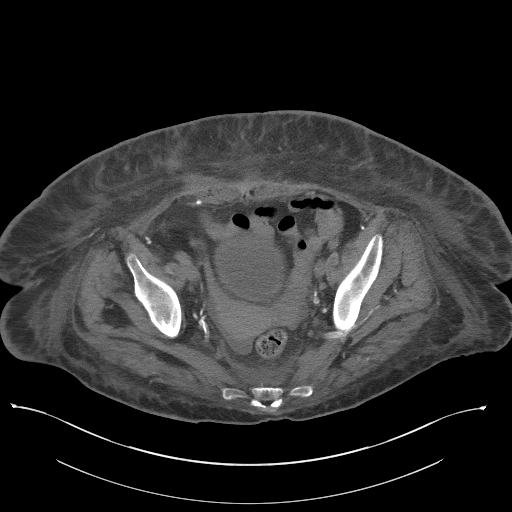
[im 37/101  soft-tissue]
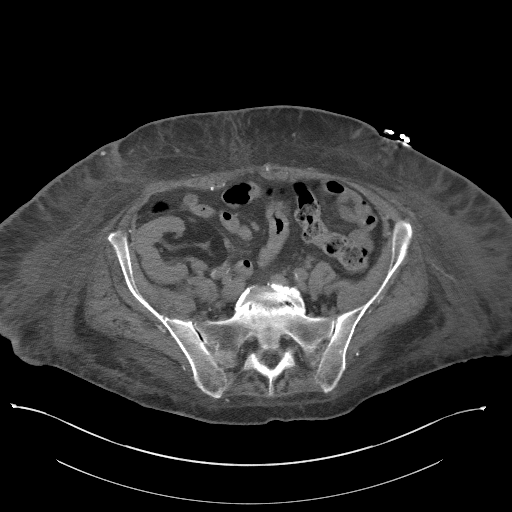
[im 43/101  soft-tissue]
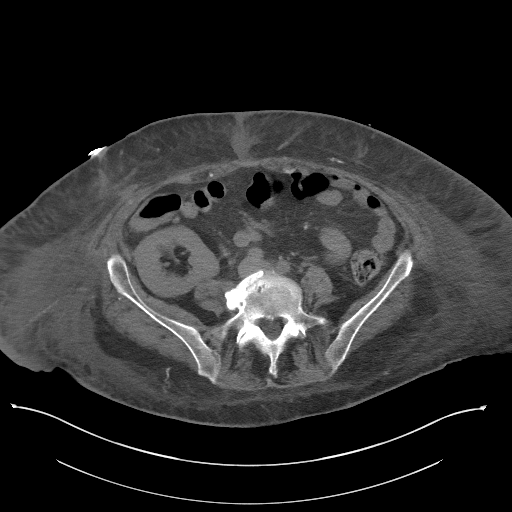
[im 53/101  soft-tissue]
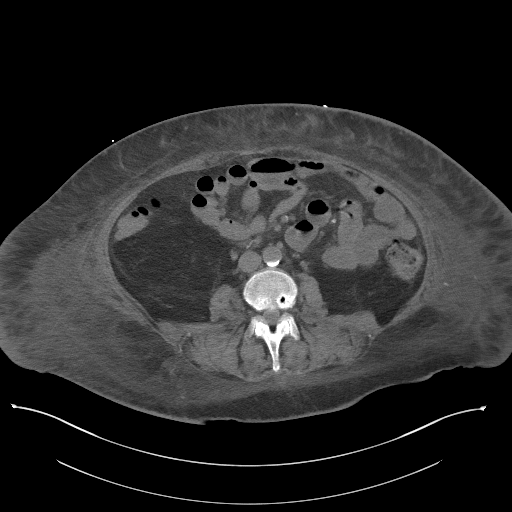
[im 58/101  soft-tissue]
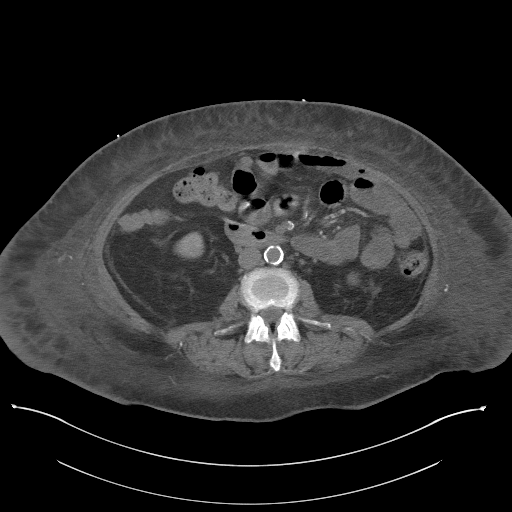
[im 64/101  soft-tissue]
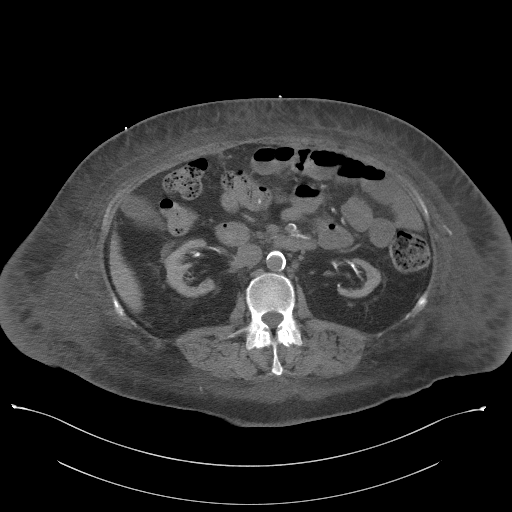
[im 64/101  bone]
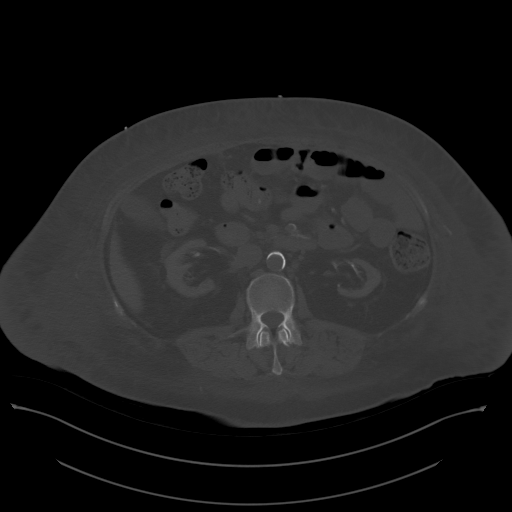
[im 74/101  soft-tissue]
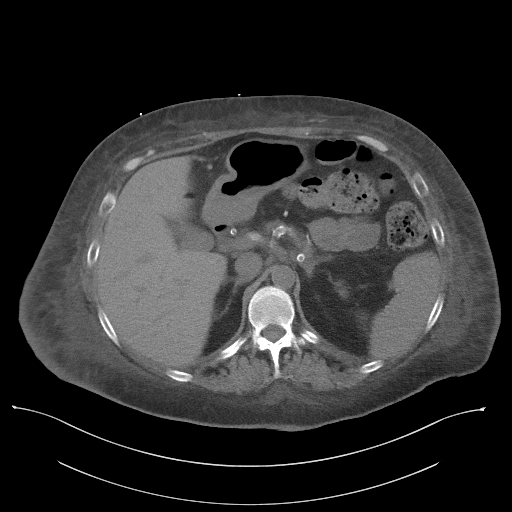
[im 79/101  soft-tissue]
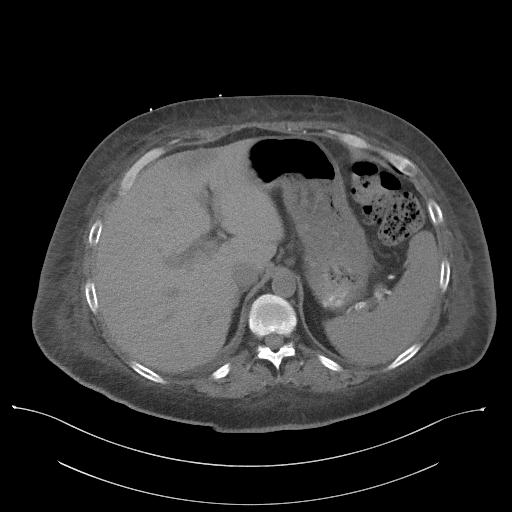
[im 85/101  soft-tissue]
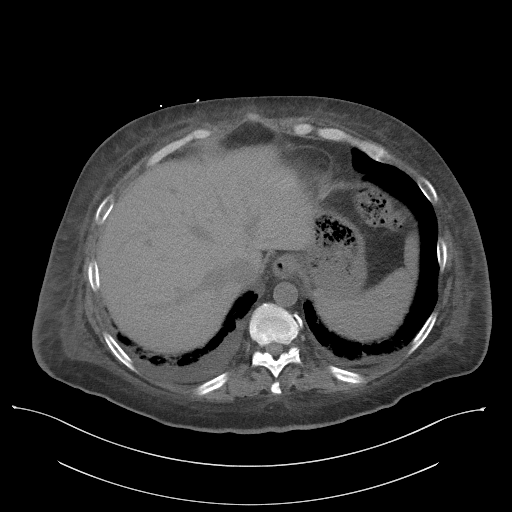
[im 95/101  soft-tissue]
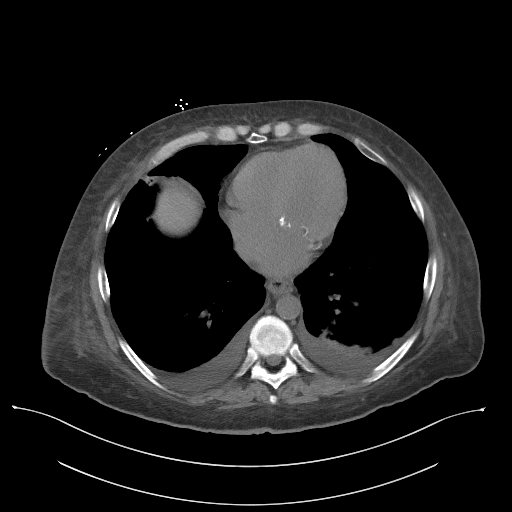

[Series 5: coronal · coronal · 0.98mm/px · 3 of 146 slices shown]
[im 49/146  soft-tissue]
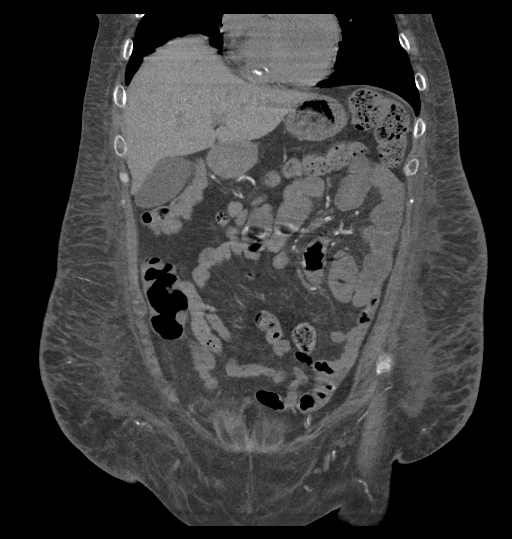
[im 65/146  soft-tissue]
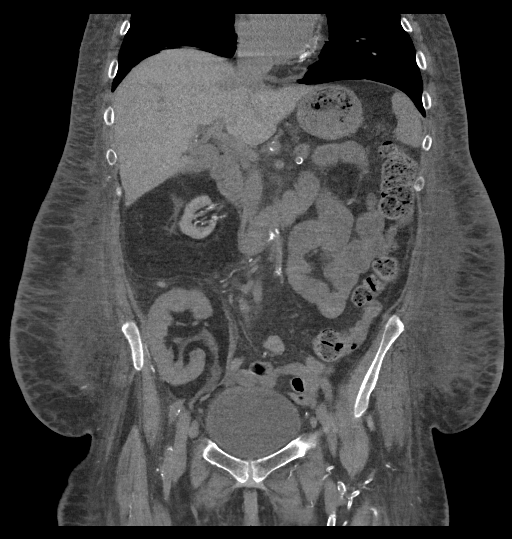
[im 81/146  soft-tissue]
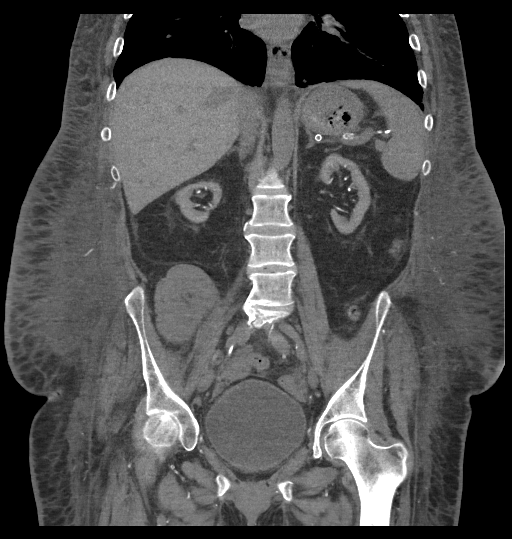

[16 of 46 positions shown; findings below may reference images not displayed]

FINDINGS: Lower chest: Small bilateral pleural effusions are noted which are
new from the prior exam. Left lower lobe mild infiltrate is noted as
well new from the prior exam.

Hepatobiliary: Focal fatty infiltration of the liver is noted along
the falciform ligament. The gallbladder is well distended. No other
focal abnormality is noted.

Pancreas: Unremarkable. No pancreatic ductal dilatation or
surrounding inflammatory changes.

Spleen: Normal in size without focal abnormality.

Adrenals/Urinary Tract: Adrenal glands are within normal limits. The
native kidneys are again shrunken with diffuse renal vascular
calcifications. A transplant kidney is noted in the right lower
quadrant without mass lesion or hydronephrosis. No renal calculi are
seen. The bladder is well distended. Few small foci of air are noted
within the bladder which may be related to recent instrumentation.
Clinical correlation is recommended.

Stomach/Bowel: No obstructive or inflammatory changes are noted. The
appendix is within normal limits.

Vascular/Lymphatic: Aortic atherosclerosis. No enlarged abdominal or
pelvic lymph nodes.

Reproductive: Uterus and bilateral adnexa are unremarkable.

Other: Minimal free fluid is noted within the pelvis. Changes of
edema are noted within the subcutaneous tissues. Small fat
containing umbilical hernia is noted.

Musculoskeletal: Postsurgical changes are noted in the right hip.
Degenerative changes of lumbar spine are noted.
IMPRESSION: Atrophic native kidneys with normal appearing transplant kidney in
the right iliac fossa.

Changes consistent with subcutaneous edema

No acute abnormality is noted within the abdomen and pelvis. The
overall appearance is stable from the prior exam.

## 2019-09-02 IMAGING — CT CT CHEST W/O CM
2 of 3 series · 15 of 36 positions shown, 18 images · non-contrast
Comparison: Current chest radiographs.

CLINICAL DATA: Short of breath for several days.  No chest pain.

EXAM:
CT CHEST WITHOUT CONTRAST
TECHNIQUE: Multidetector CT imaging of the chest was performed following the
standard protocol without IV contrast.

[Series 2: thorax · axial · 0.76mm/px · z∈[-90,+204]mm · 12 of 173 slices shown, 15 images]
[im 13/173  mediastinal]
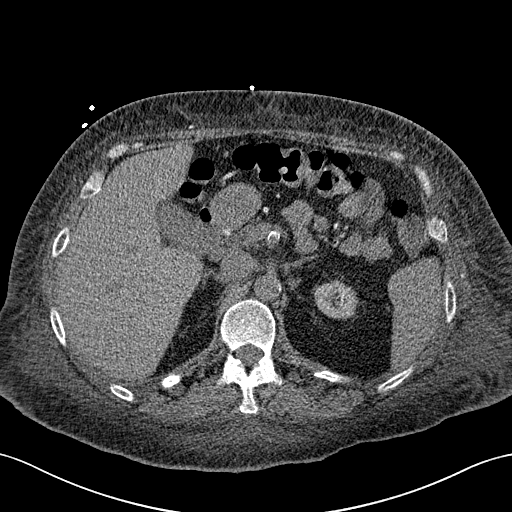
[im 13/173  lung]
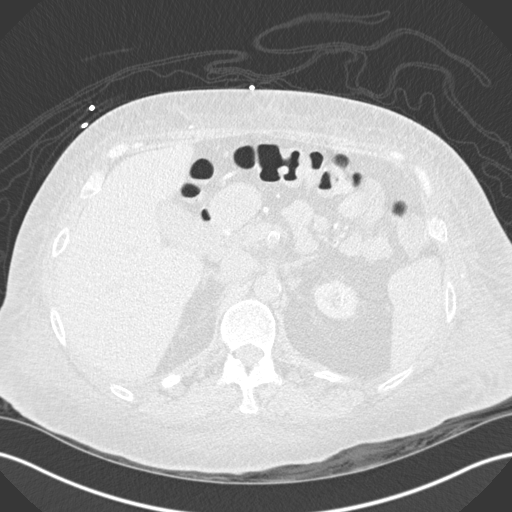
[im 26/173  lung]
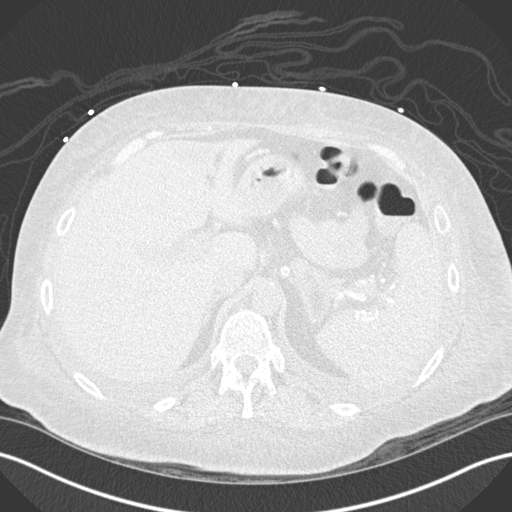
[im 39/173  lung]
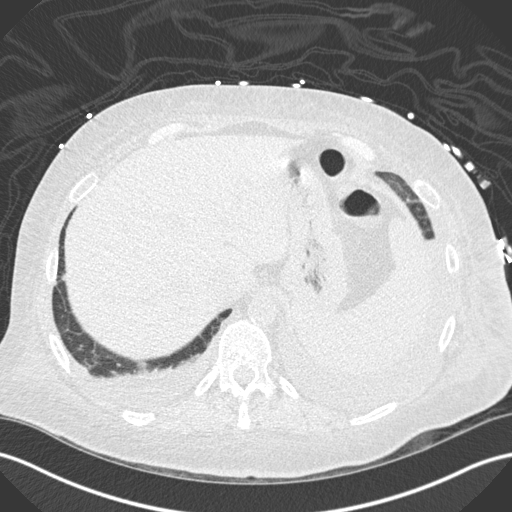
[im 51/173  lung]
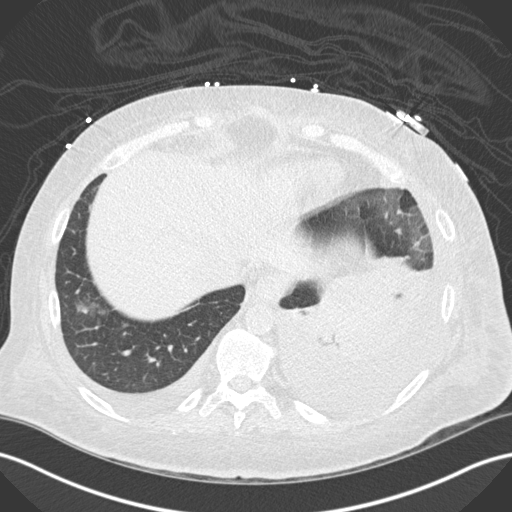
[im 64/173  mediastinal]
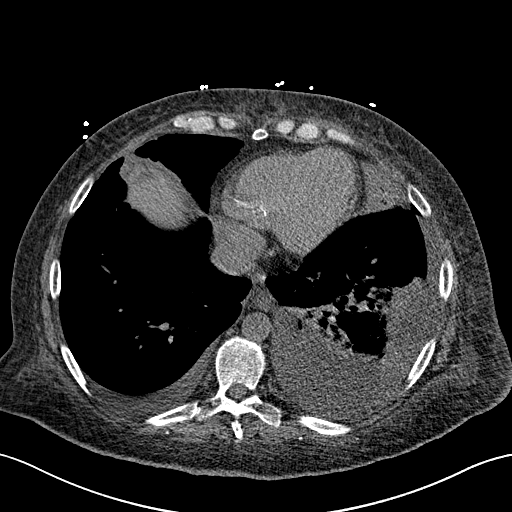
[im 64/173  lung]
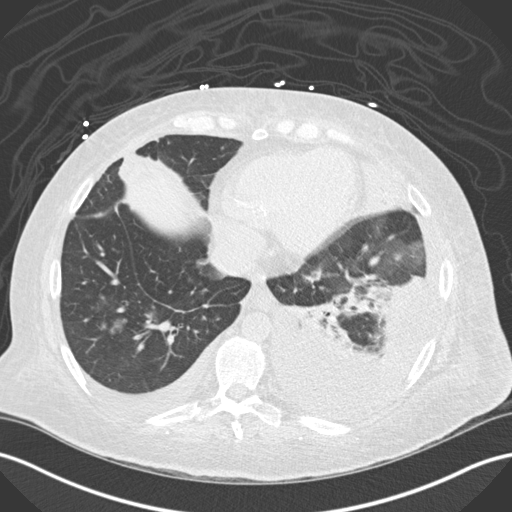
[im 77/173  lung]
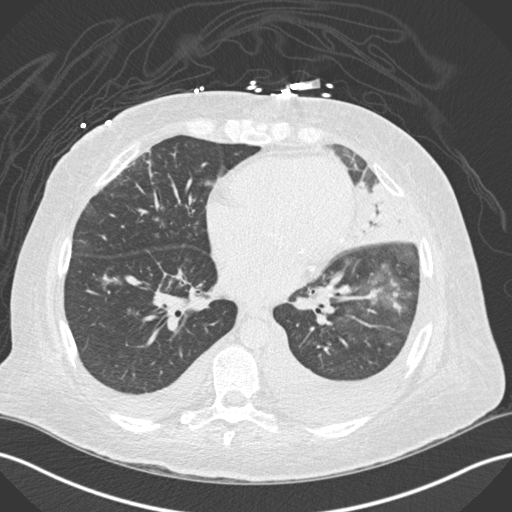
[im 96/173  lung]
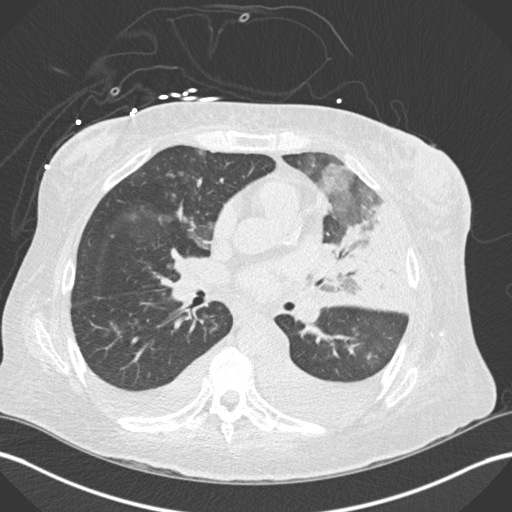
[im 109/173  lung]
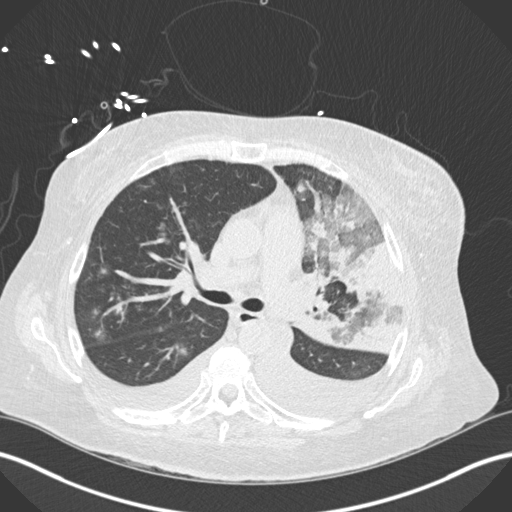
[im 122/173  mediastinal]
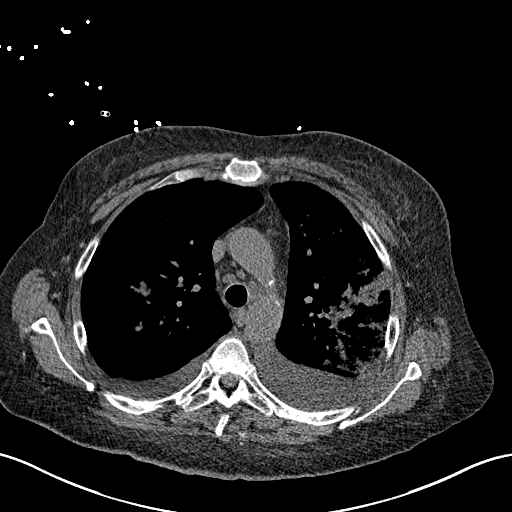
[im 122/173  lung]
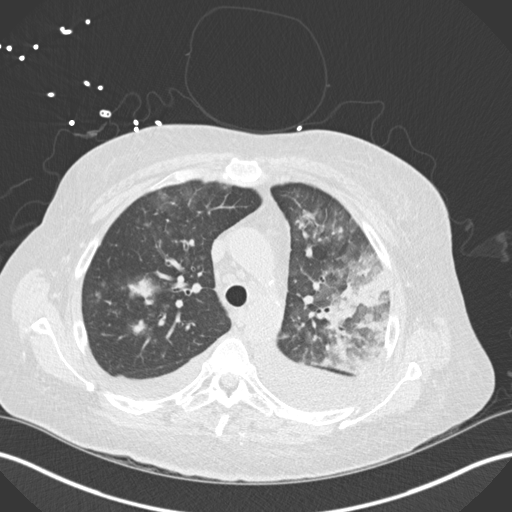
[im 134/173  lung]
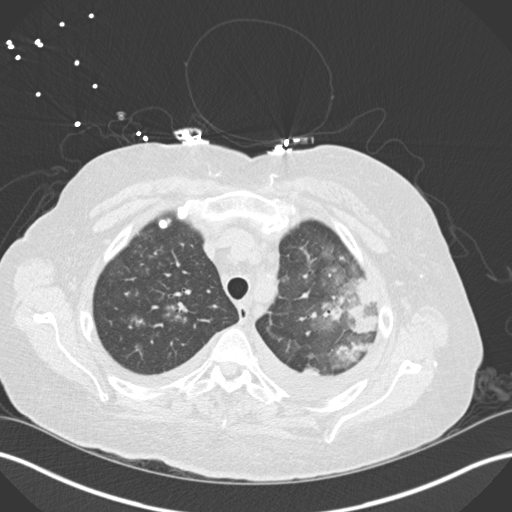
[im 147/173  lung]
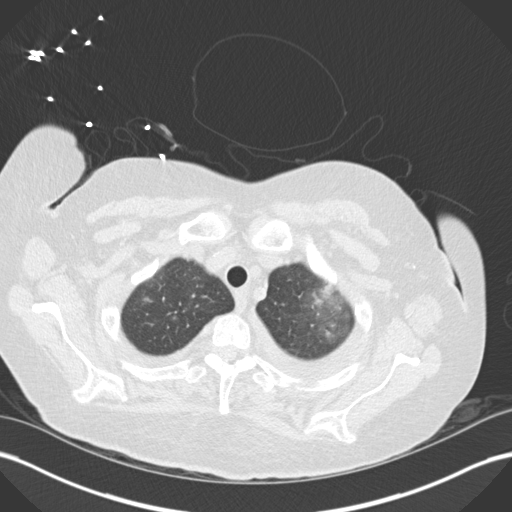
[im 160/173  lung]
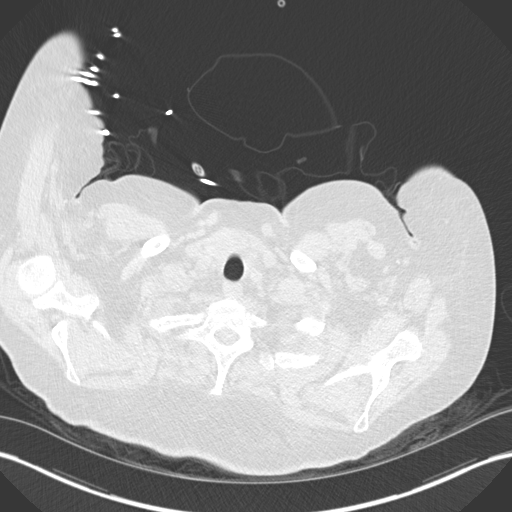

[Series 5: coronal · coronal · 0.64mm/px · 3 of 133 slices shown]
[im 27/133  lung]
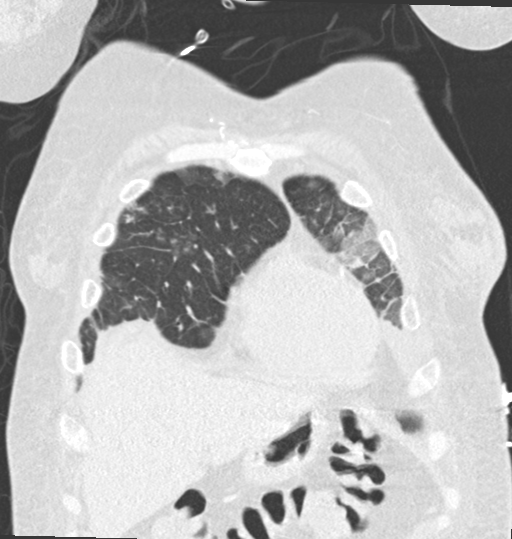
[im 53/133  lung]
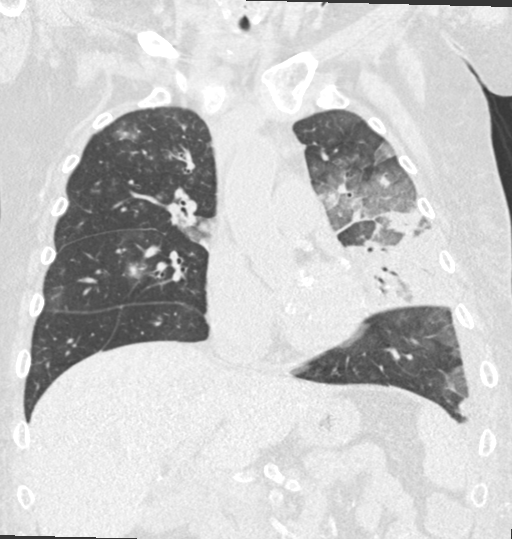
[im 80/133  lung]
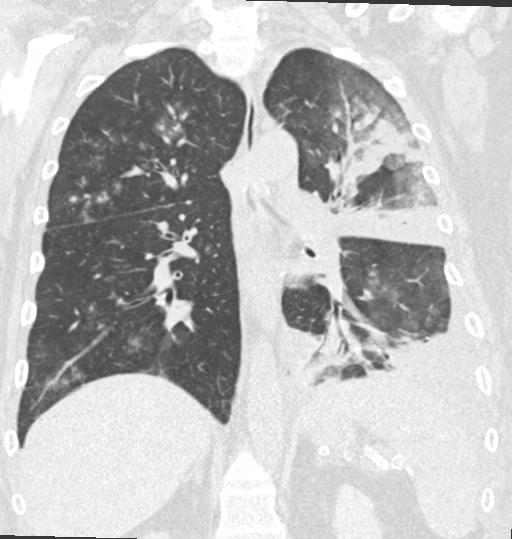

[15 of 36 positions shown; findings below may reference images not displayed]

FINDINGS: Cardiovascular: Heart is normal in size and configuration. No
pericardial effusion. There are three-vessel coronary artery
calcifications. Great vessels are normal in caliber. Mild aortic
atherosclerotic calcifications.

Mediastinum/Nodes: Hypoattenuating nodule from the inferior aspect
of the thyroid measuring 2.5 cm. No neck base or axillary masses or
pathologically enlarged lymph nodes. Several prominent mediastinal
lymph nodes, largest a left paratracheal node adjacent to the AP
window measuring 14 mm in short axis. No mediastinal or hilar
masses. No discrete enlarged or prominent hilar lymph nodes.

Trachea is normal in caliber and widely patent. Esophagus is
unremarkable.

Lungs/Pleura: Moderate left and small right pleural effusions. There
are bilateral areas of patchy ground-glass and more confluent lung
consolidation, most evident in the left upper lobe. Additional
opacity is noted in the left lower lobe which is likely a
combination of infection and atelectasis. There is a discrete
pleural based nodule in the right lower lobe, image 127, series 7,
measuring 9 mm. No pneumothorax.

Upper Abdomen: Area of hypoattenuation noted in the liver adjacent
to the falciform ligament consistent with focal fatty infiltration.
There is marked atrophy of the visualize kidneys. Diffuse vascular
calcifications are noted. No acute findings.

Musculoskeletal: No fracture or acute finding. No osteoblastic or
osteolytic lesions.

There is surrounding subcutaneous soft tissue edema.
IMPRESSION: 1. Findings are consistent with multifocal pneumonia, most evident
in the left upper lobe. There are associated moderate left and small
right pleural effusions.
2. Prominent to borderline enlarged mediastinal lymph nodes are
presumed reactive given the changes in the lungs.
3. Coronary artery calcifications and mild aortic atherosclerosis.

Aortic Atherosclerosis (4R519-TU8.8).

## 2019-09-03 IMAGING — DX DG CHEST 1V PORT
1 series · 2 of 2 positions shown · non-contrast
Comparison: Radiograph November 14, 2017.

CLINICAL DATA: Status post left thoracentesis.

EXAM:
PORTABLE CHEST 1 VIEW

[Series 1: chest ap · 0.14mm/px · 2 of 2 slices shown]
[im 1/2]
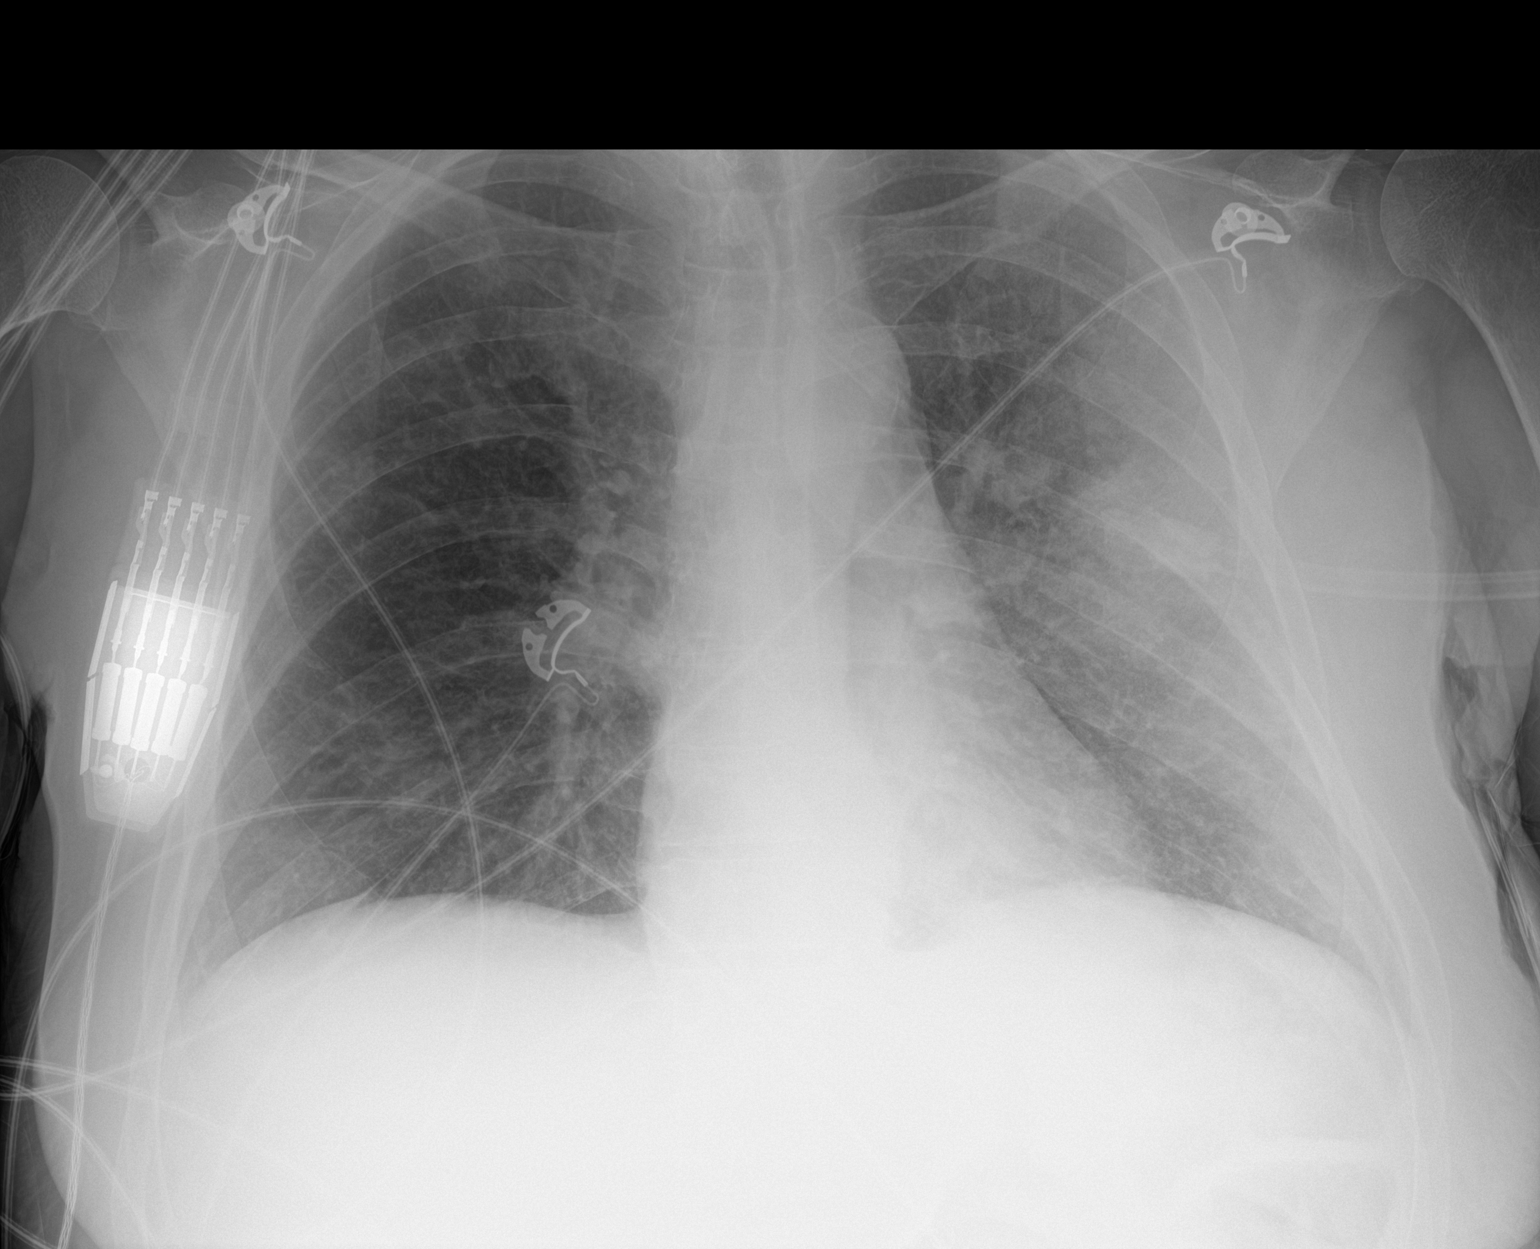
[im 2/2]
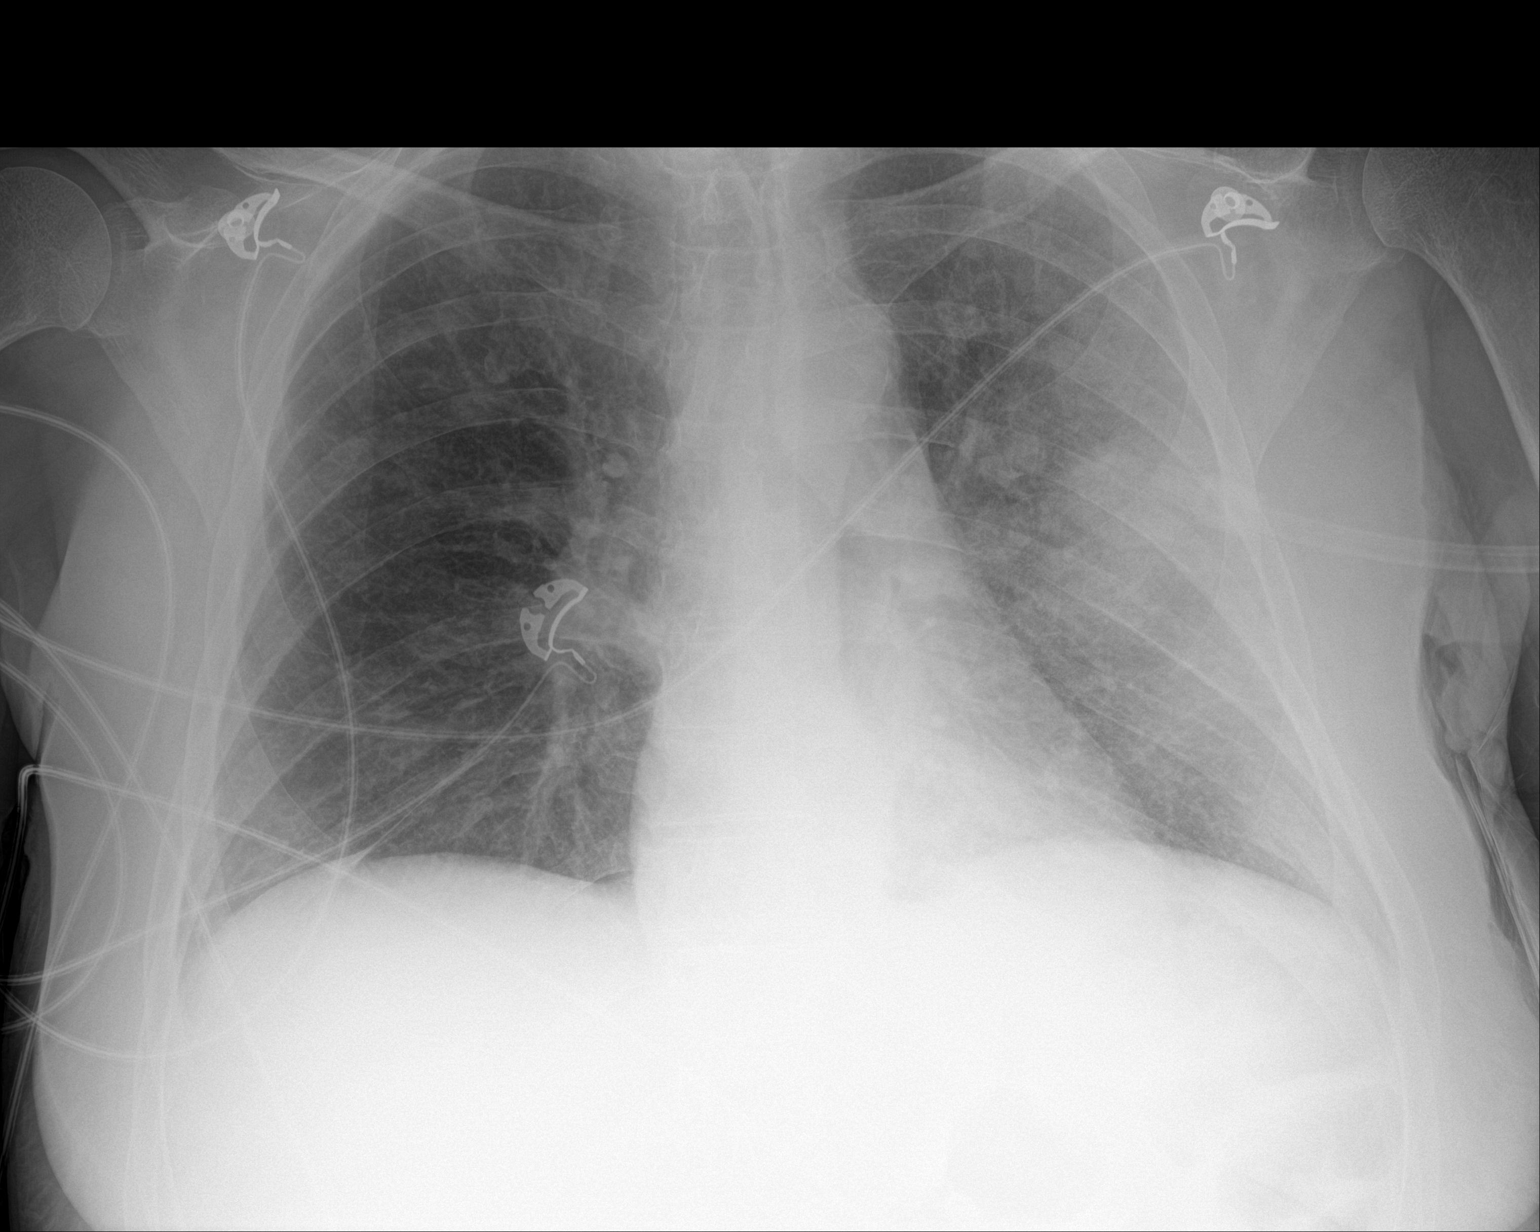

[2 of 2 positions shown; findings below may reference images not displayed]

FINDINGS: The heart size and mediastinal contours are within normal limits. No
pneumothorax is noted. Right lung is clear. Left pleural effusion
noted on prior exam is nearly resolved. Mild ill-defined left
midlung opacity is noted suggesting inflammation or atelectasis..
The visualized skeletal structures are unremarkable.
IMPRESSION: No pneumothorax status post thoracentesis. Left pleural effusion is
nearly resolved.

## 2019-09-03 IMAGING — US US THORACENTESIS ASP PLEURAL SPACE W/IMG GUIDE
1 series · 3 of 3 positions shown · non-contrast
Comparison: none

INDICATION: Patient with history of chronic kidney disease, pneumonia, dyspnea,
cough, left pleural effusion. Request made for diagnostic and
therapeutic left thoracentesis.

[Series 1: us thoracentesis asp pleural space w/img guide · 0.23mm/px · 3 of 3 slices shown]
[im 1/3]
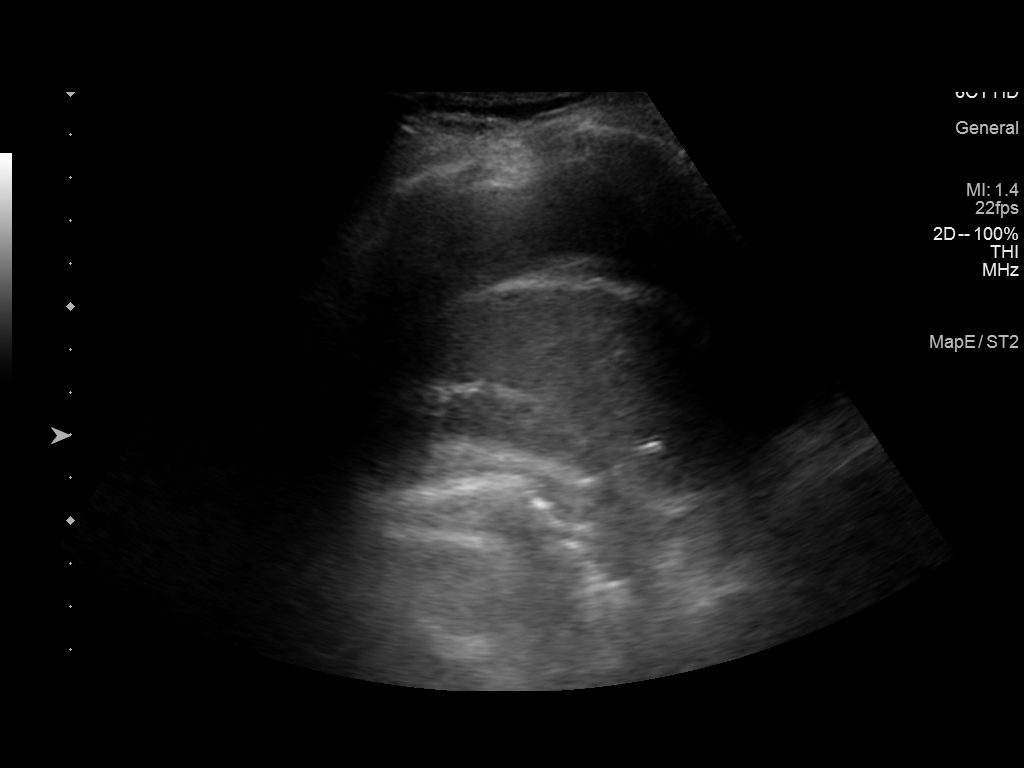
[im 2/3]
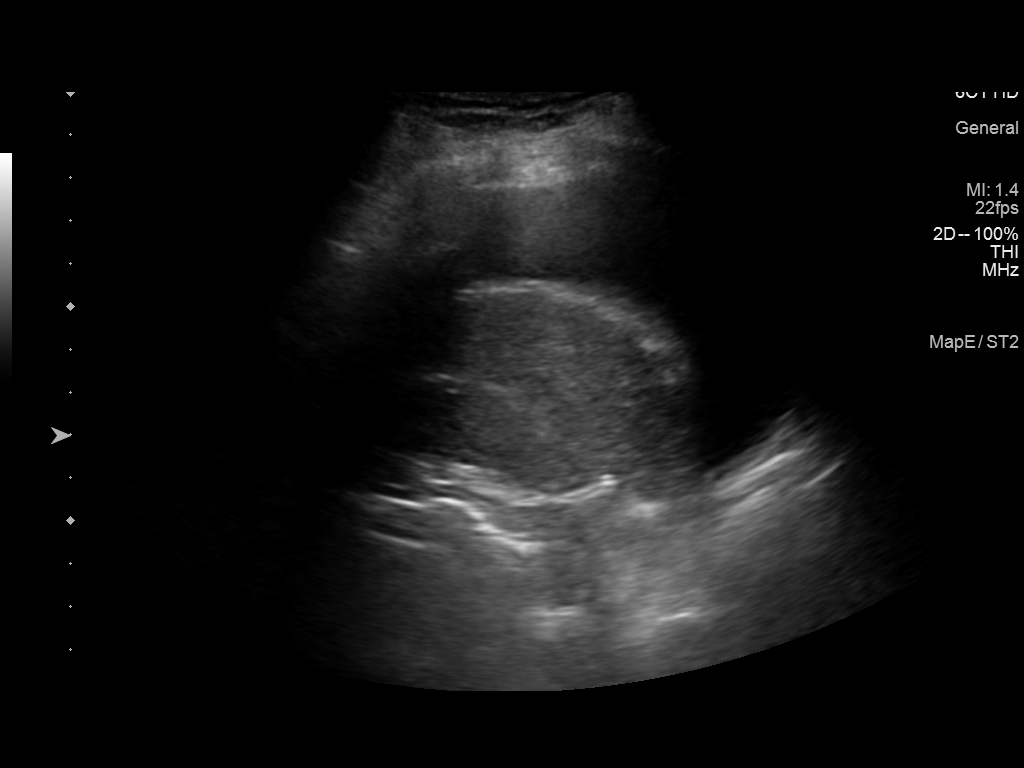
[im 3/3]
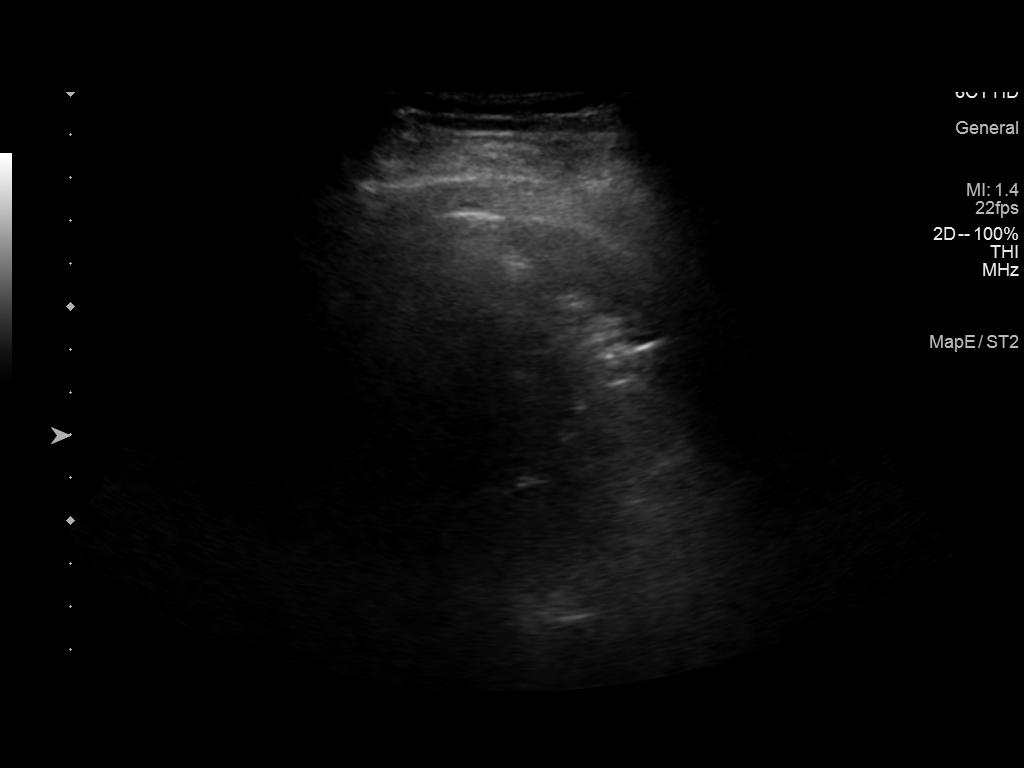

[3 of 3 positions shown; findings below may reference images not displayed]

EXAM:
ULTRASOUND GUIDED DIAGNOSTIC AND THERAPEUTIC LEFT THORACENTESIS

MEDICATIONS:
1% lidocaine local

COMPLICATIONS:
None immediate.

PROCEDURE:
An ultrasound guided thoracentesis was thoroughly discussed with the
patient and questions answered. The benefits, risks, alternatives
and complications were also discussed. The patient understands and
wishes to proceed with the procedure. Written consent was obtained.

Ultrasound was performed to localize and mark an adequate pocket of
fluid in the left chest. The area was then prepped and draped in the
normal sterile fashion. 1% Lidocaine was used for local anesthesia.
Under ultrasound guidance a 6 Fr Safe-T-Centesis catheter was
introduced. Thoracentesis was performed. The catheter was removed
and a dressing applied.
FINDINGS: A total of approximately 1 liter of clear, light yellow fluid was
removed. Samples were sent to the laboratory as requested by the
clinical team.
IMPRESSION: Successful ultrasound guided diagnostic and therapeutic left
thoracentesis yielding 1 liter of pleural fluid.

## 2019-09-04 IMAGING — RF DG SWALLOWING FUNCTION - NRPT [facility]
6 series · 20 of 24 positions shown · non-contrast
Comparison: none

[Series 1: cp_standard · 0.35mm/px · 3 of 83 frames shown (1 of 6)]
[frame 13/83]
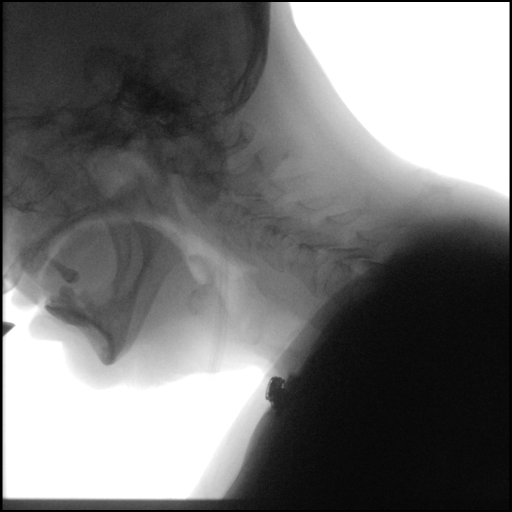
[frame 42/83]
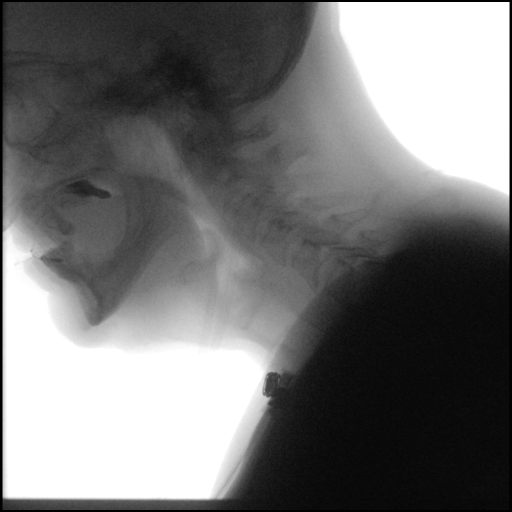
[frame 76/83]
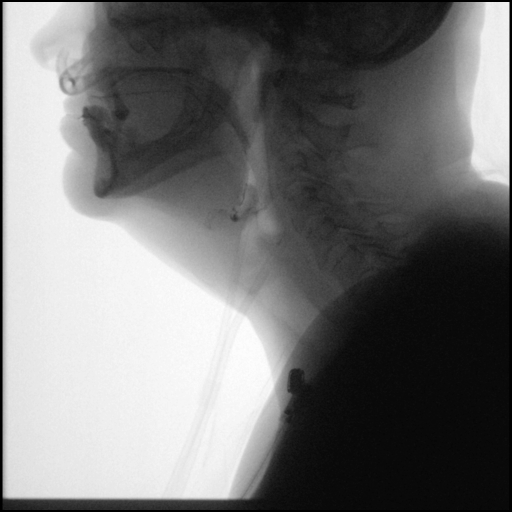

[Series 2: cp_standard · 0.35mm/px · 4 of 84 frames shown (2 of 6)]
[frame 13/84]
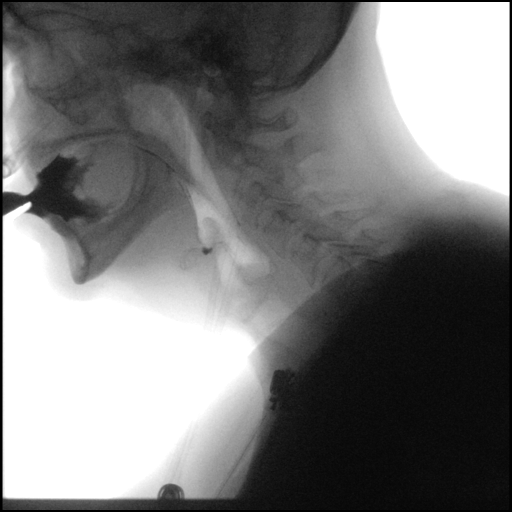
[frame 43/84]
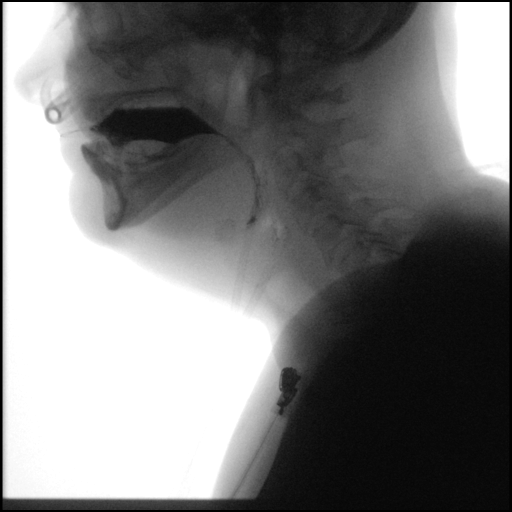
[frame 72/84]
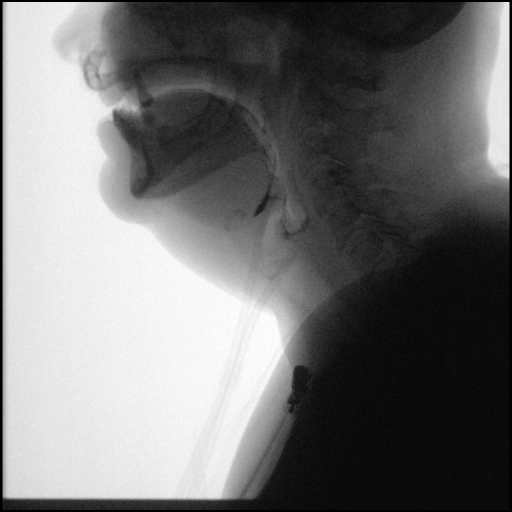
[frame 84/84]
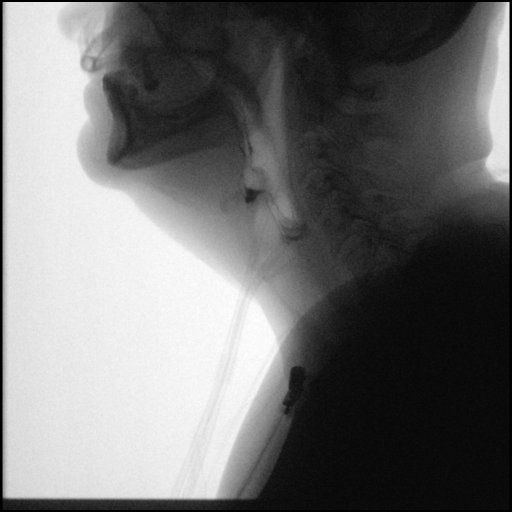

[Series 3: cp_standard · 0.35mm/px · 3 of 72 frames shown (3 of 6)]
[frame 37/72]
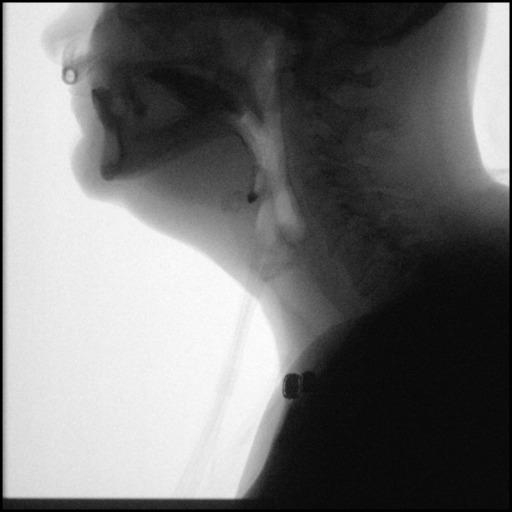
[frame 39/72]
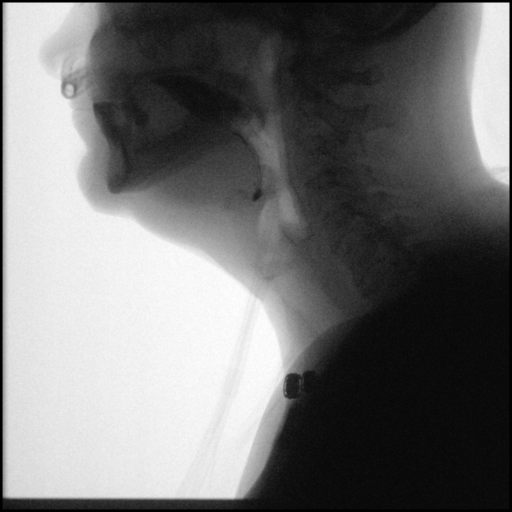
[frame 62/72]
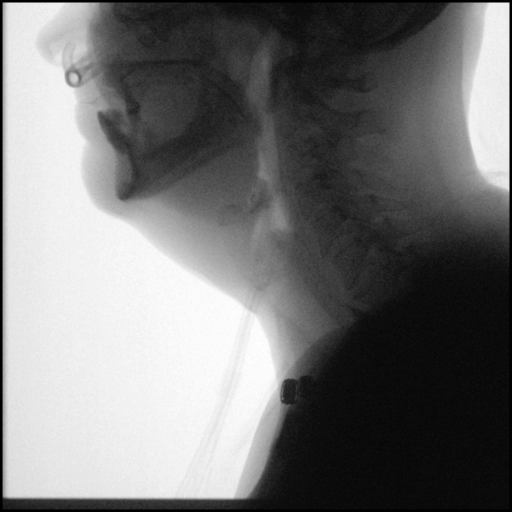

[Series 4: cp_standard · 0.35mm/px · 3 of 328 frames shown (4 of 6)]
[frame 50/328]
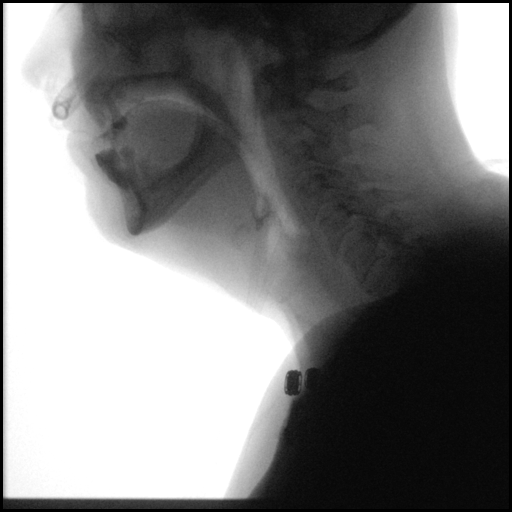
[frame 165/328]
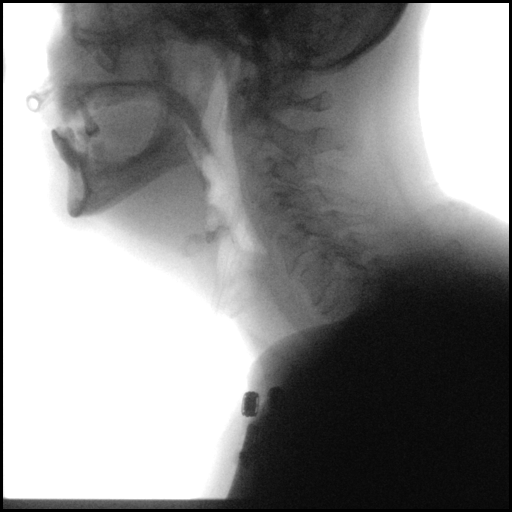
[frame 279/328]
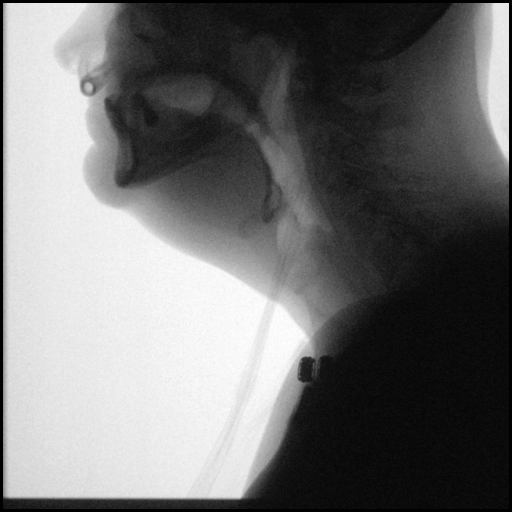

[Series 5: cp_standard · 0.35mm/px · 4 of 205 frames shown (5 of 6)]
[frame 31/205]
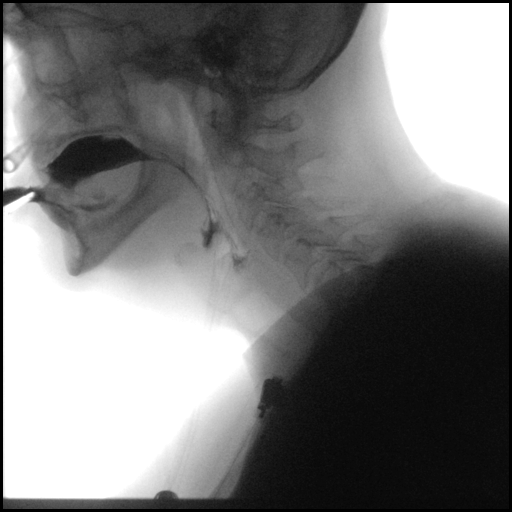
[frame 103/205]
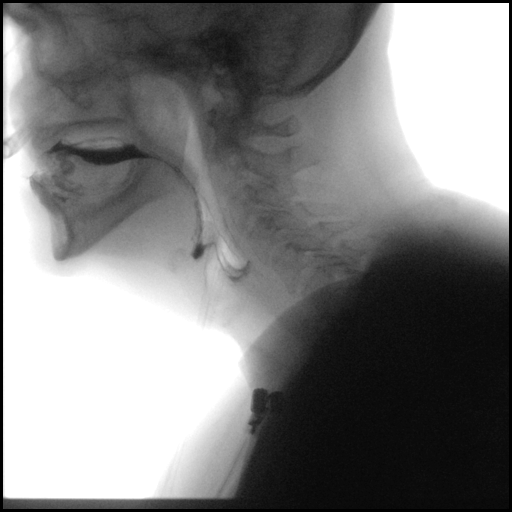
[frame 170/205]
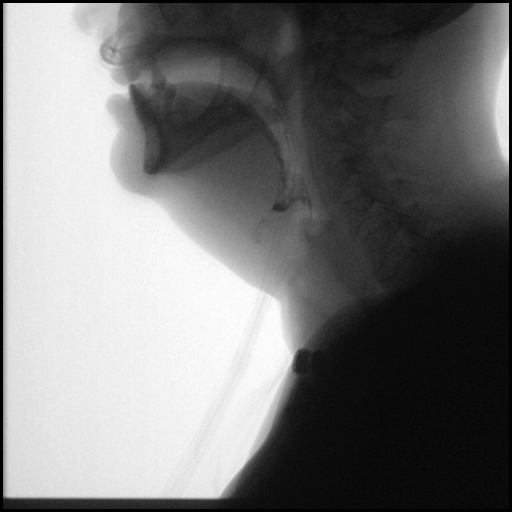
[frame 175/205]
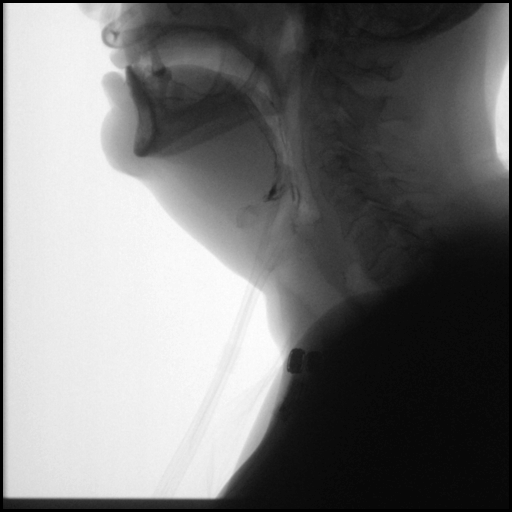

[Series 6: cp_standard · 0.35mm/px · 3 of 201 frames shown (6 of 6)]
[frame 31/201]
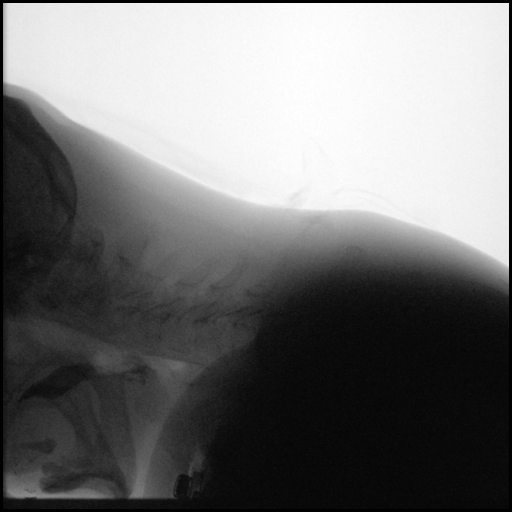
[frame 101/201]
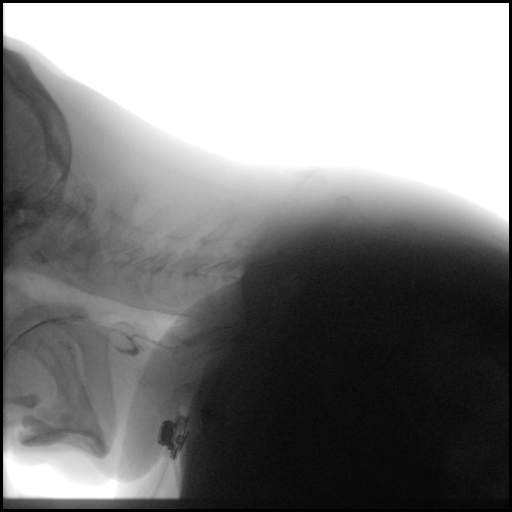
[frame 171/201]
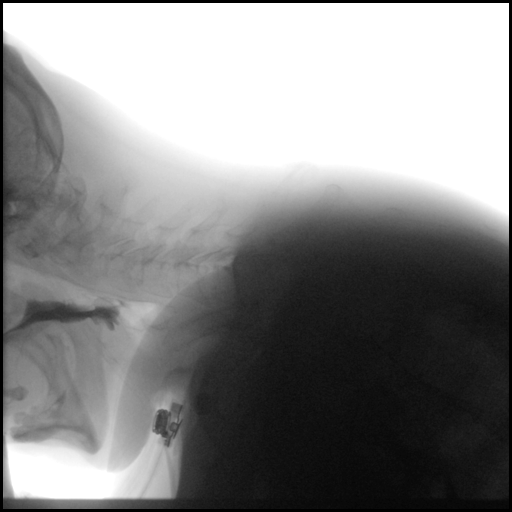

[20 of 24 positions shown; findings below may reference images not displayed]

FLUOROSCOPY FOR SWALLOWING FUNCTION STUDY:
Fluoroscopy was provided for swallowing function study, which was administered by a speech pathologist.  Final results and recommendations from this study are contained within the speech pathology report.

## 2019-10-03 IMAGING — CR DG CHEST 2V
2 series · 2 of 2 positions shown · non-contrast
Comparison: 11/15/2017

CLINICAL DATA: Short of breath, cough

EXAM:
CHEST - 2 VIEW

[w chest lat]
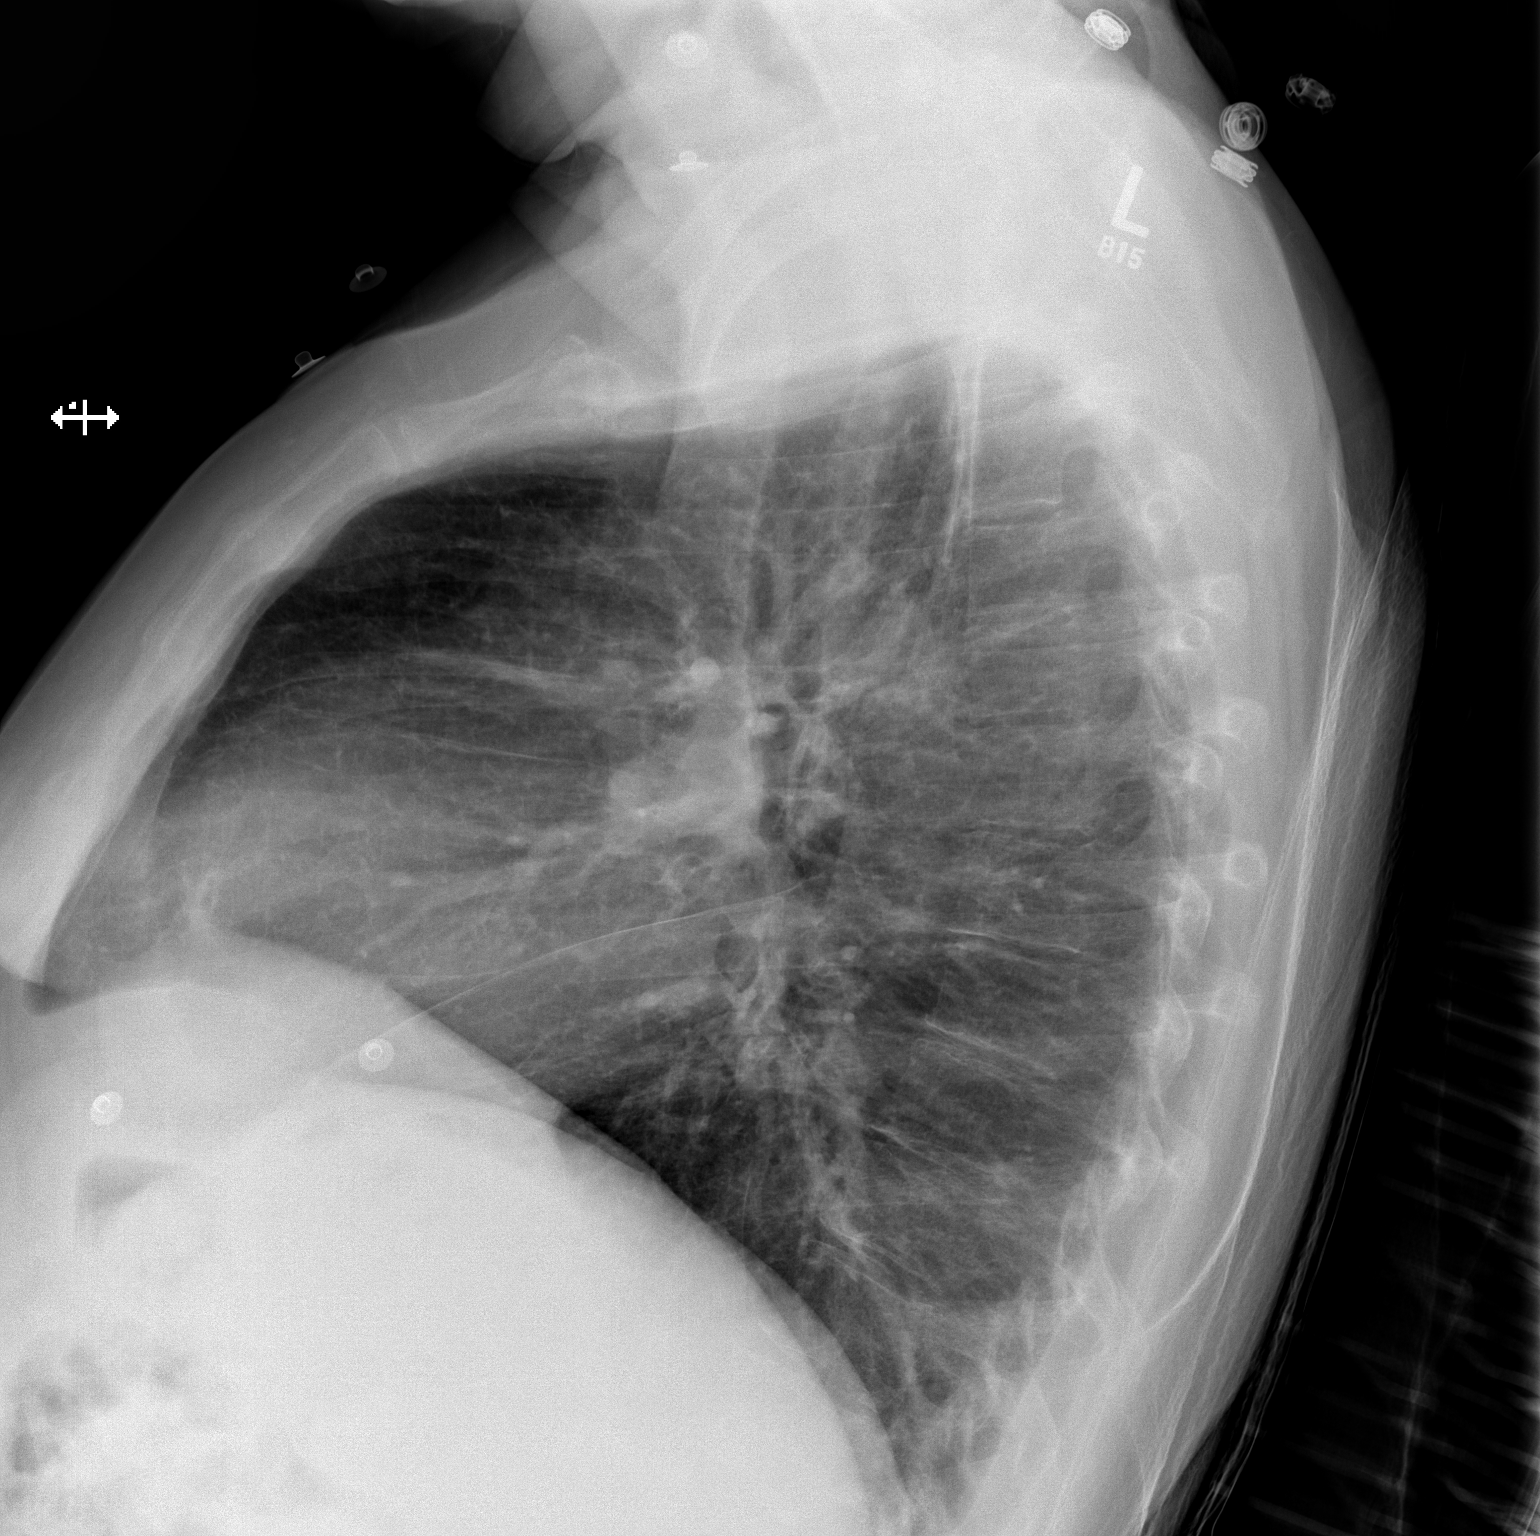

[x chest ap]
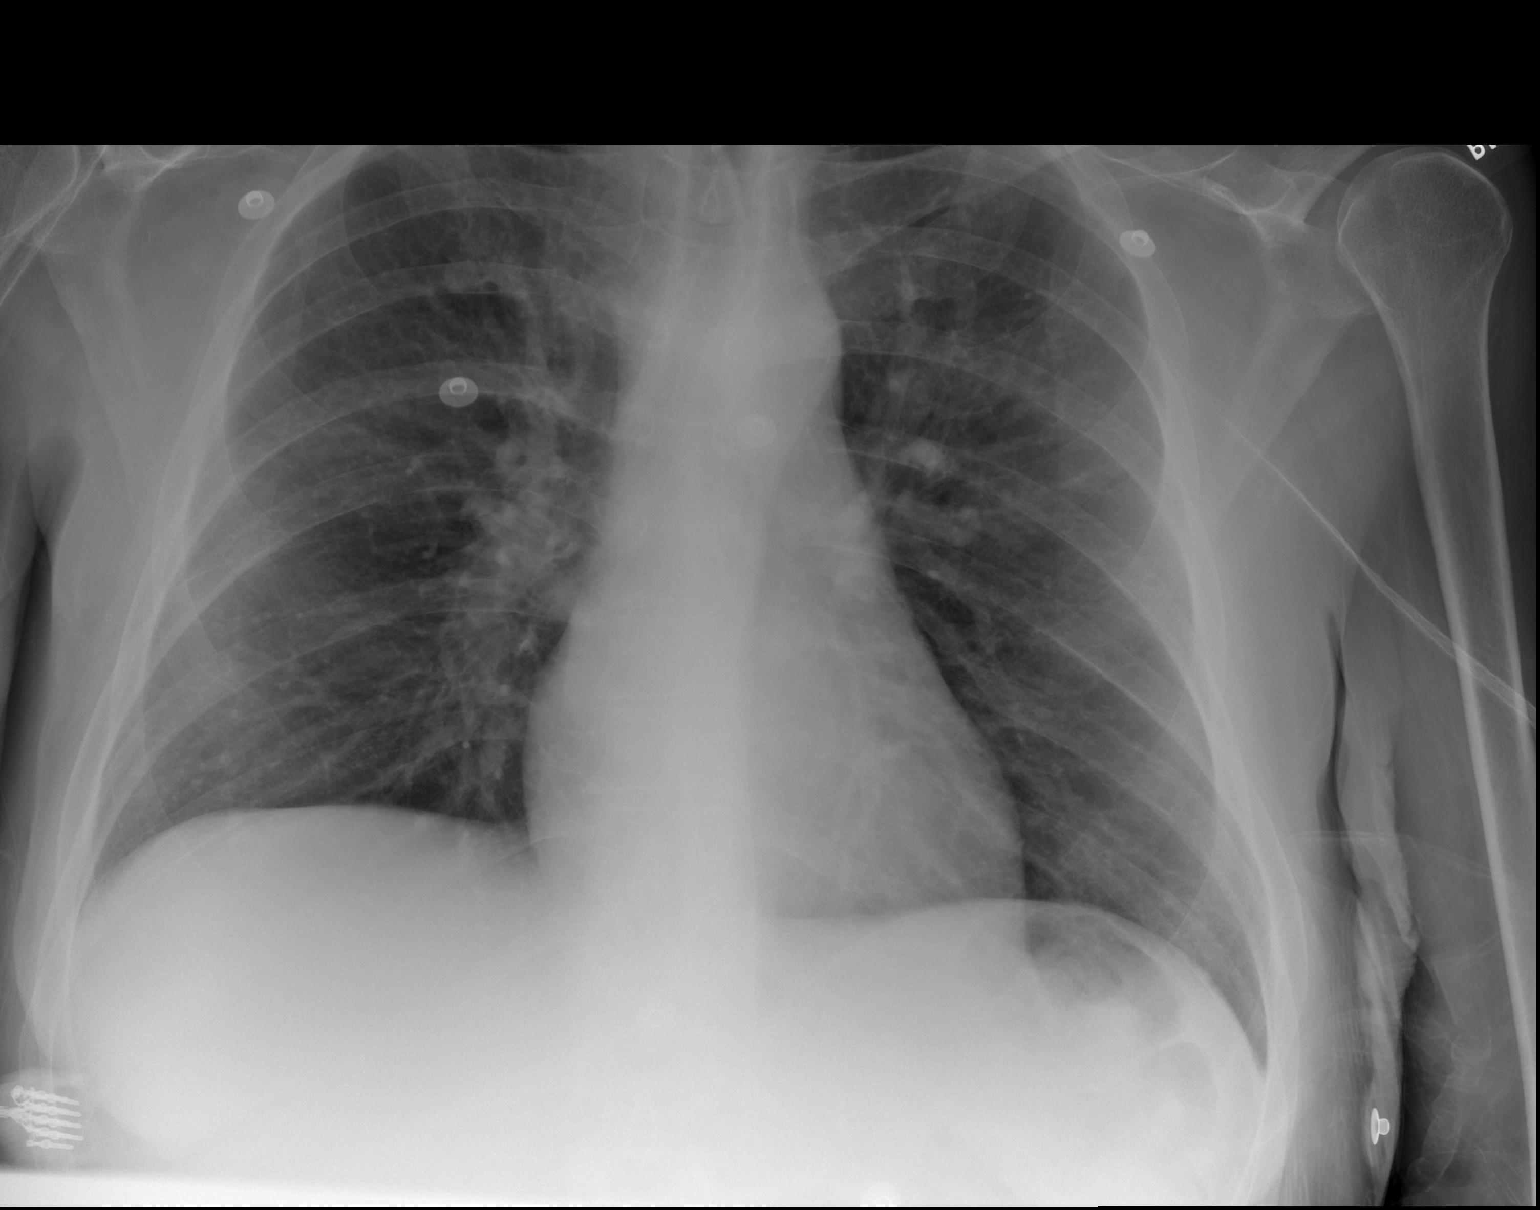

[2 of 2 positions shown; findings below may reference images not displayed]

FINDINGS: The heart size and mediastinal contours are within normal limits.
Both lungs are clear. The visualized skeletal structures are
unremarkable.
IMPRESSION: No active cardiopulmonary disease.

## 2019-10-20 IMAGING — CR DG TIBIA/FIBULA 2V*R*
3 series · 3 of 3 positions shown · non-contrast
Comparison: None.

CLINICAL DATA: Open wound along the anterior and lateral aspect of
the mid right leg.

EXAM:
RIGHT TIBIA AND FIBULA - 2 VIEW

[x tib-fib ap right]
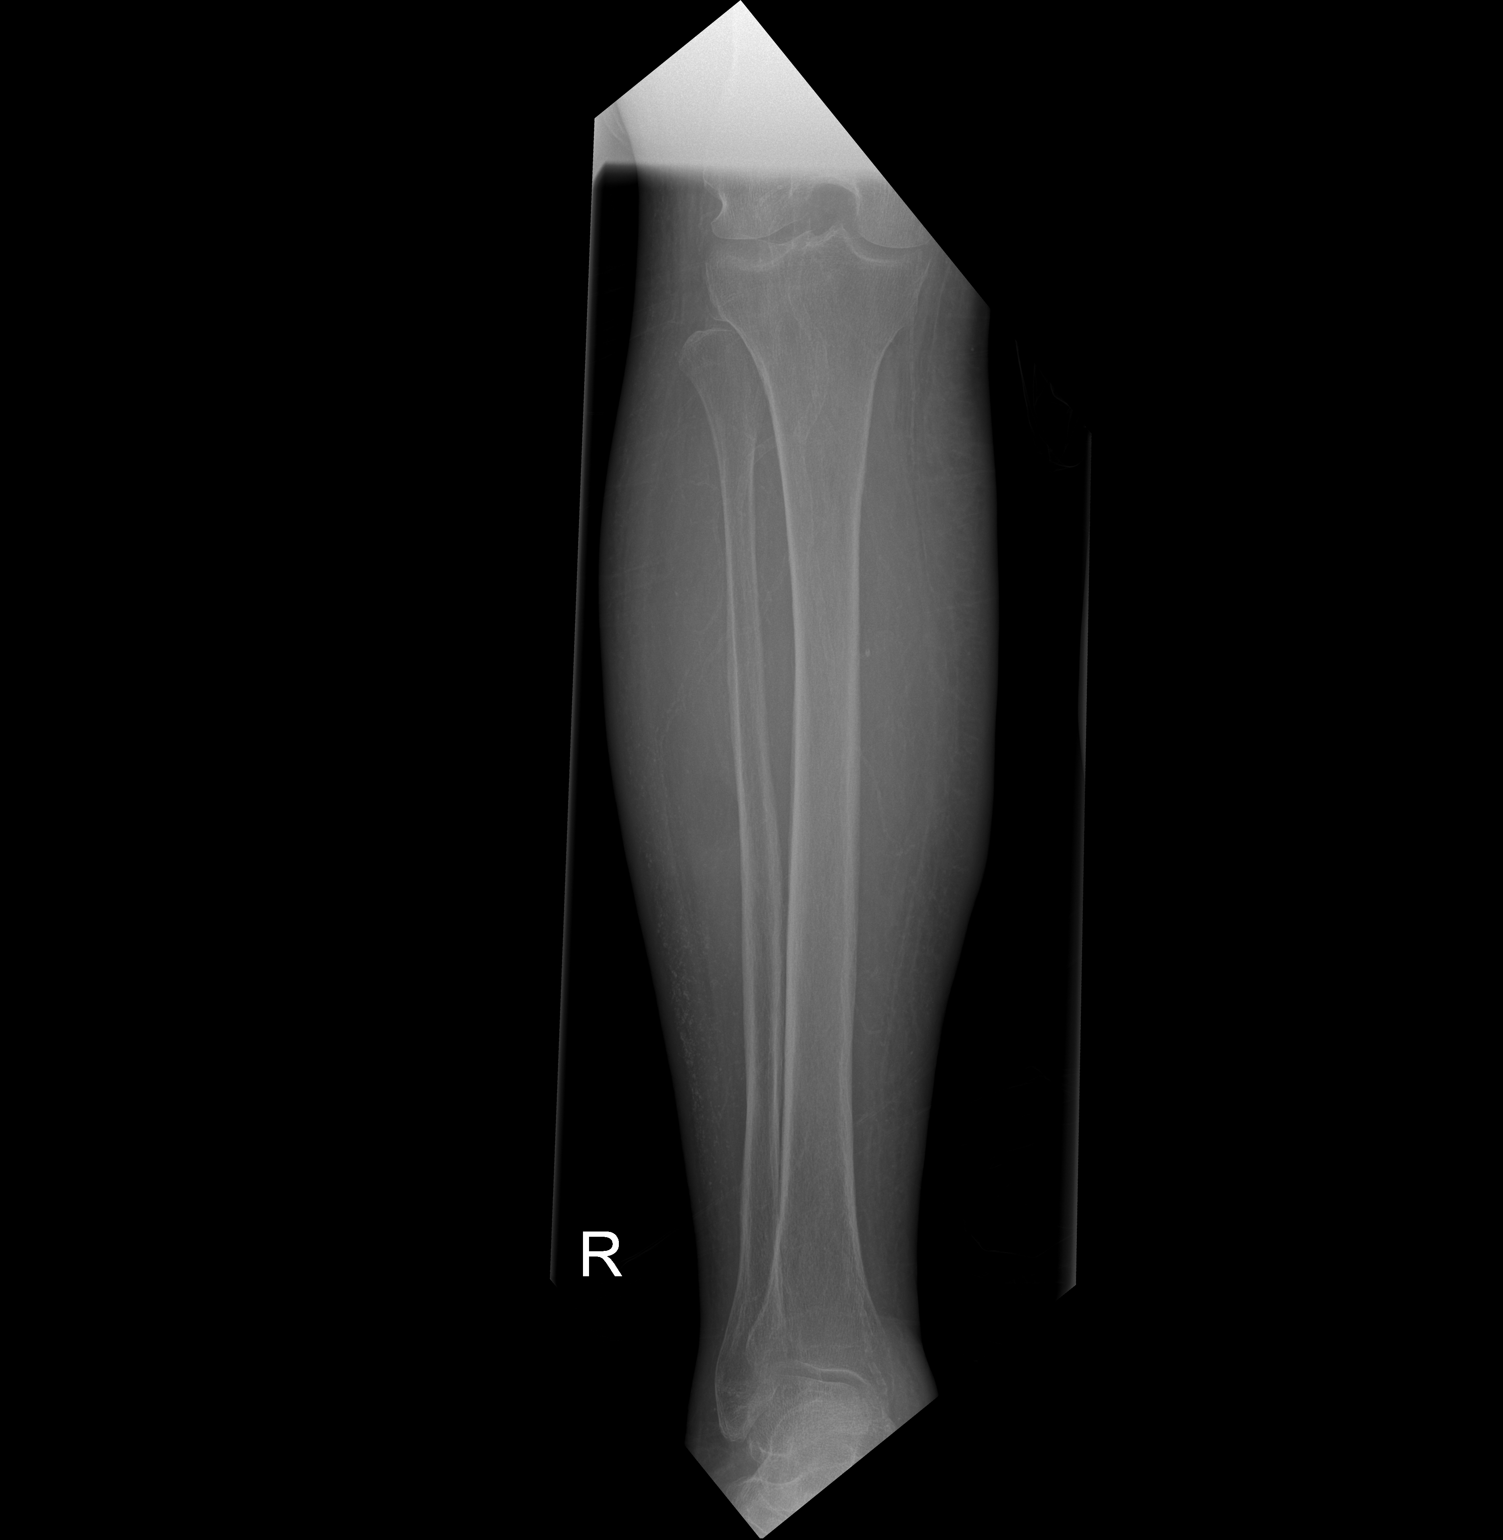

[x tib-fib lat right (1 of 2)]
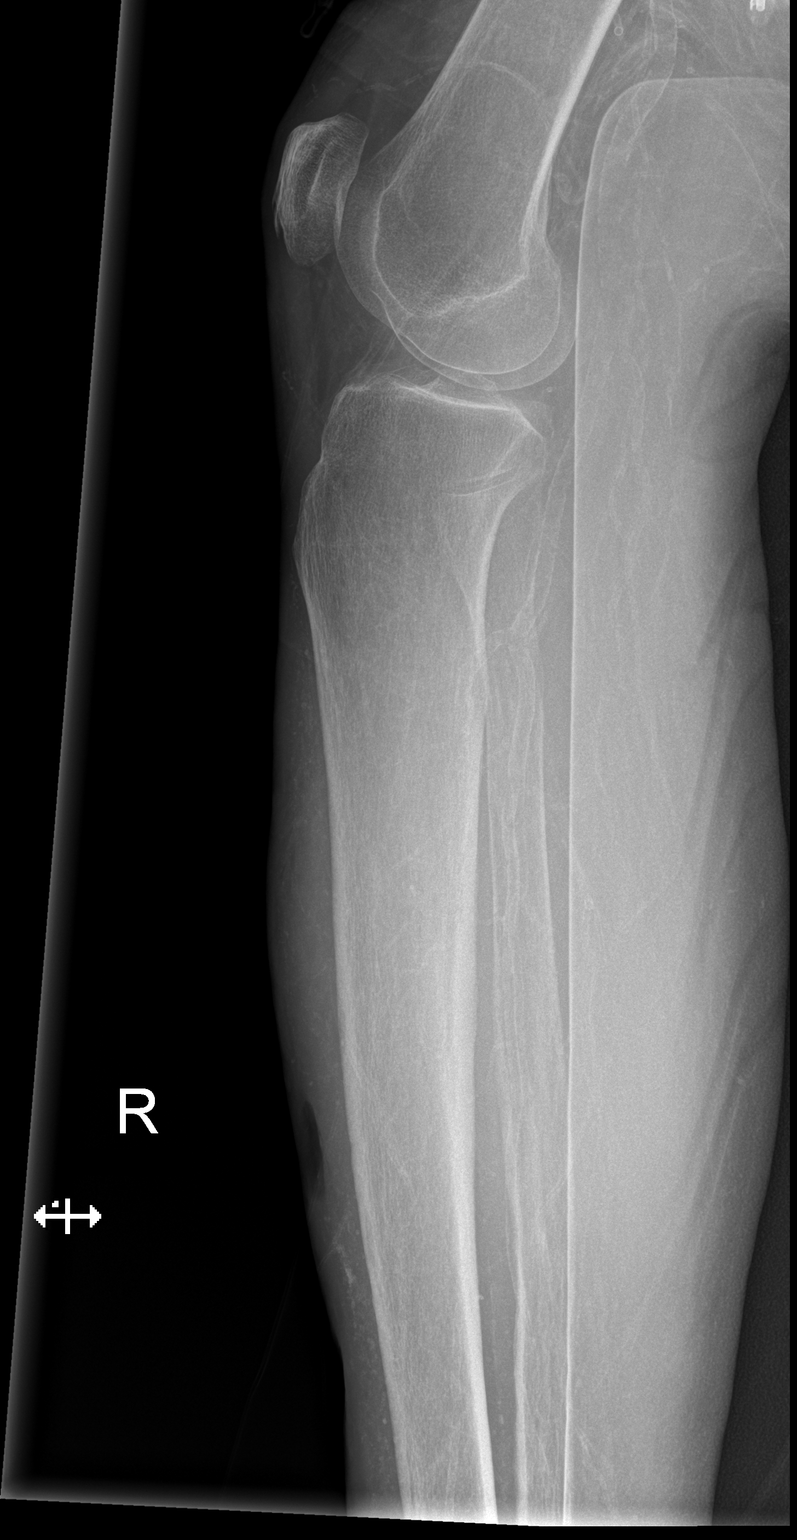

[x tib-fib lat right (2 of 2)]
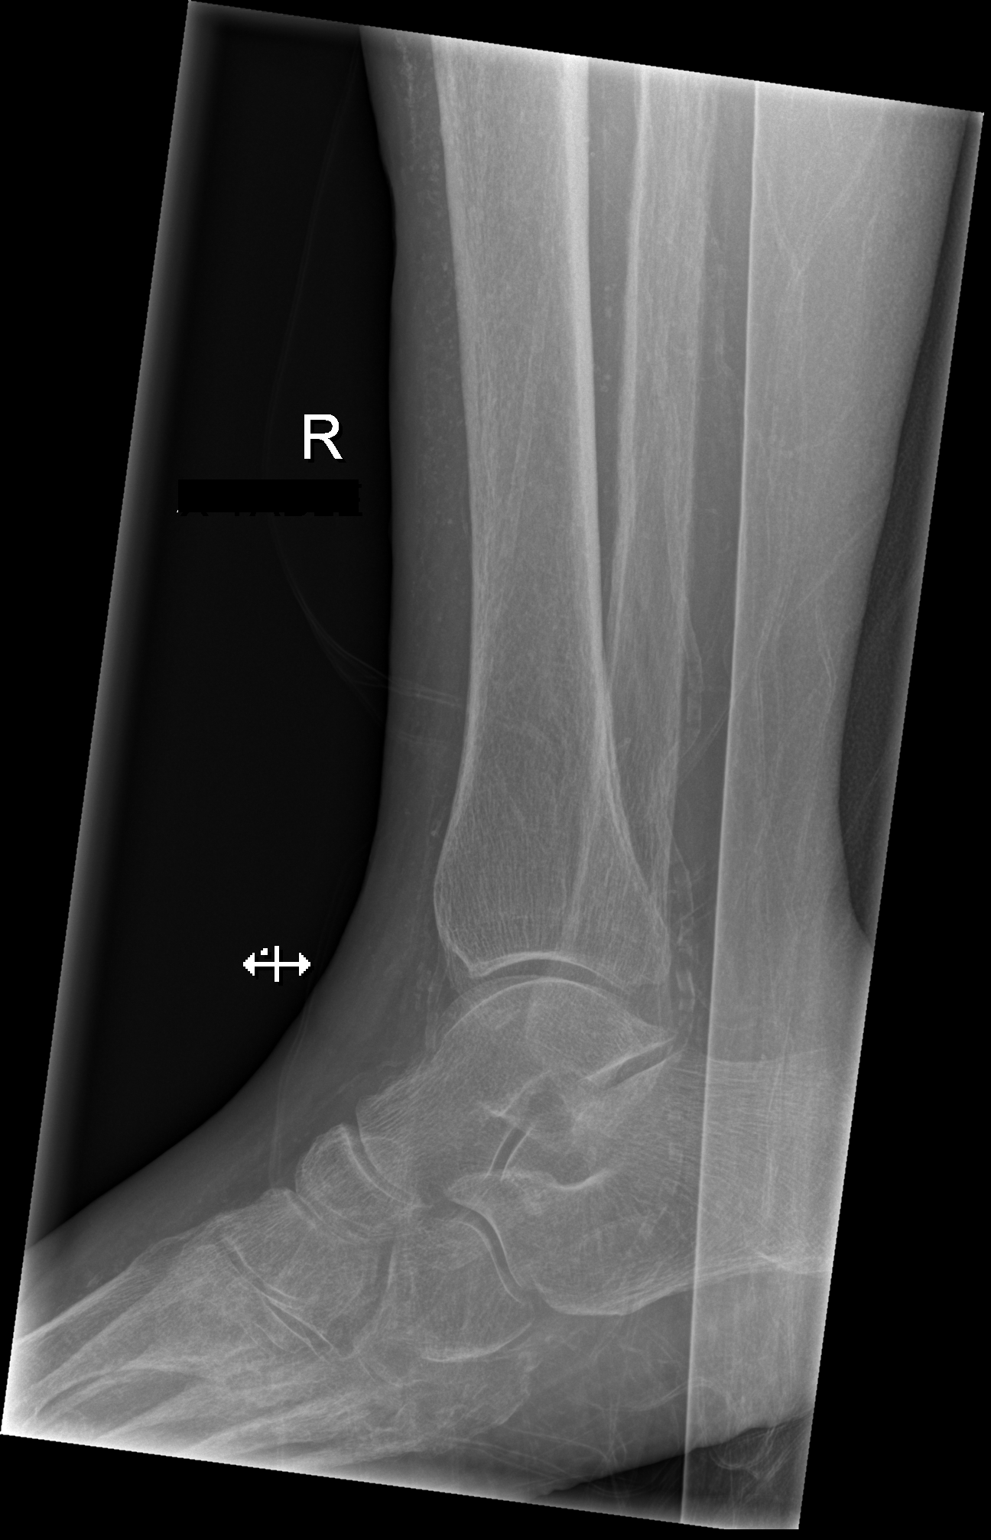

[3 of 3 positions shown; findings below may reference images not displayed]

FINDINGS: Osteopenic appearance of the tibia and fibula. No fracture bone
destruction. No focal soft tissue mass or mineralization. Tibial
arteriosclerosis is identified. Soft tissue ulceration is noted
anteriorly along the mid right leg measuring 2.7 cm and craniocaudad
span. The reported wound along the lateral aspect is not
radiographically apparent.
IMPRESSION: Osteopenic appearance of the right tibia and fibula without fracture
or bone destruction.

Soft tissue ulceration anterior mid leg.

## 2019-10-23 IMAGING — DX DG TIBIA/FIBULA 2V*R*
4 series · 4 of 4 positions shown · non-contrast
Comparison: 01/01/2018.

CLINICAL DATA: Right lower leg swelling for the past 2 months.
Clinical concern for gas-forming infection.

EXAM:
RIGHT TIBIA AND FIBULA - 2 VIEW

[tibia ap (1 of 2)]
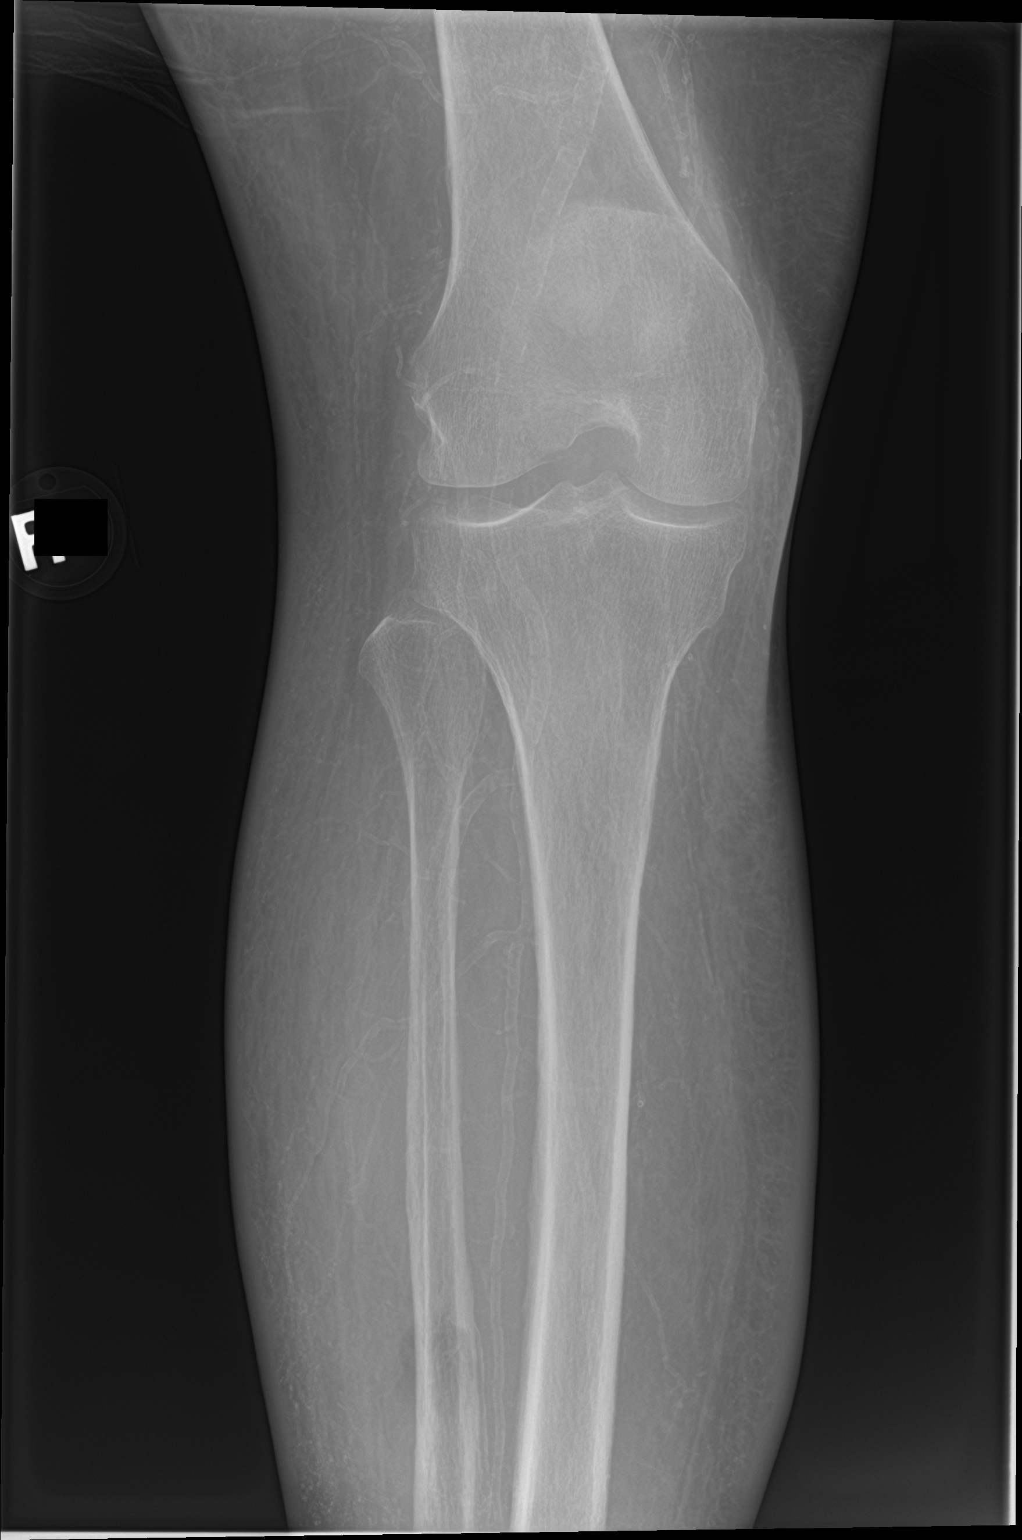

[tibia ap (2 of 2)]
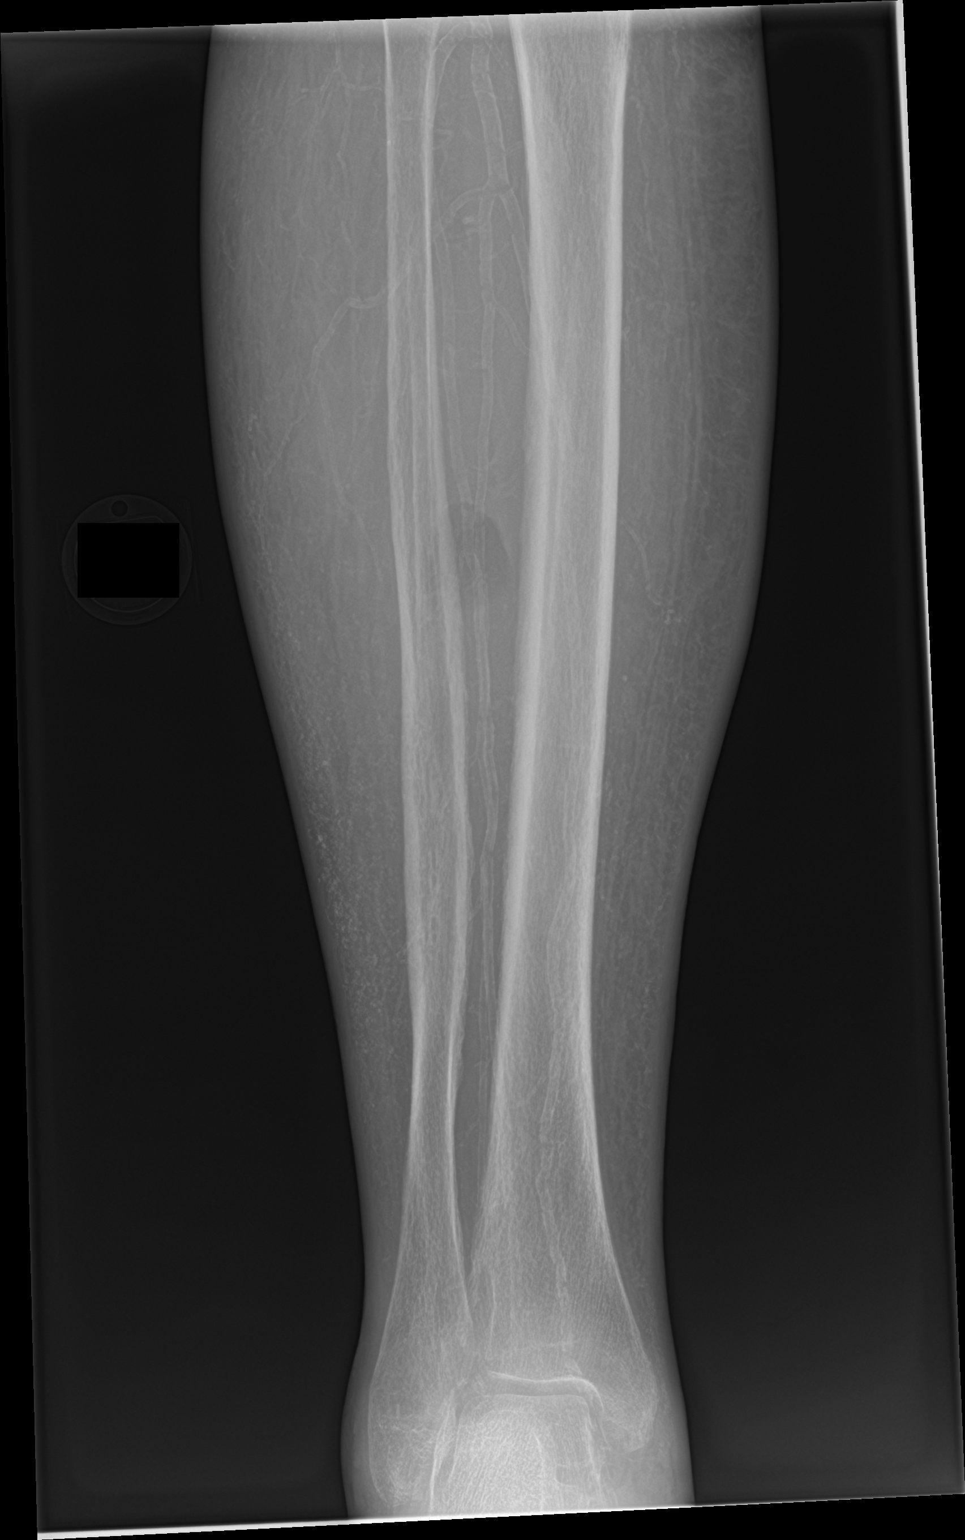

[tibia lat (1 of 2)]
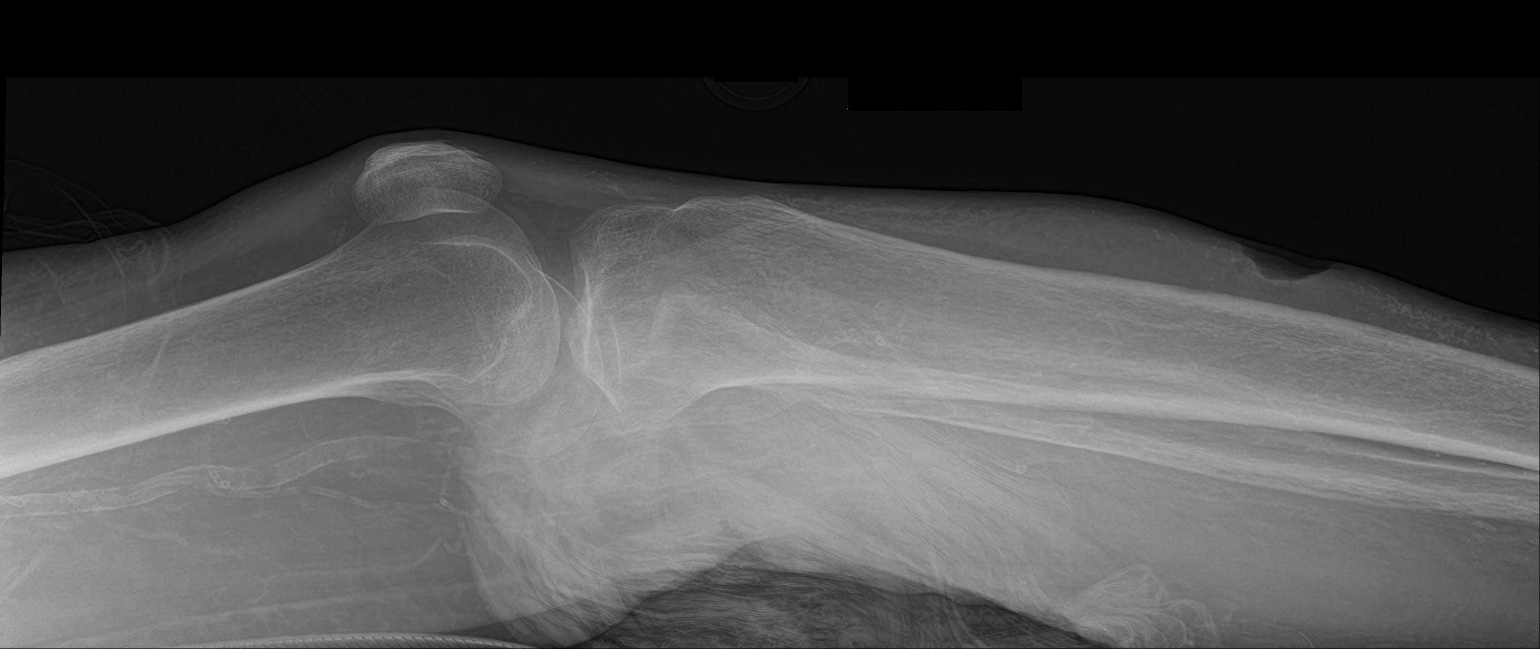

[tibia lat (2 of 2)]
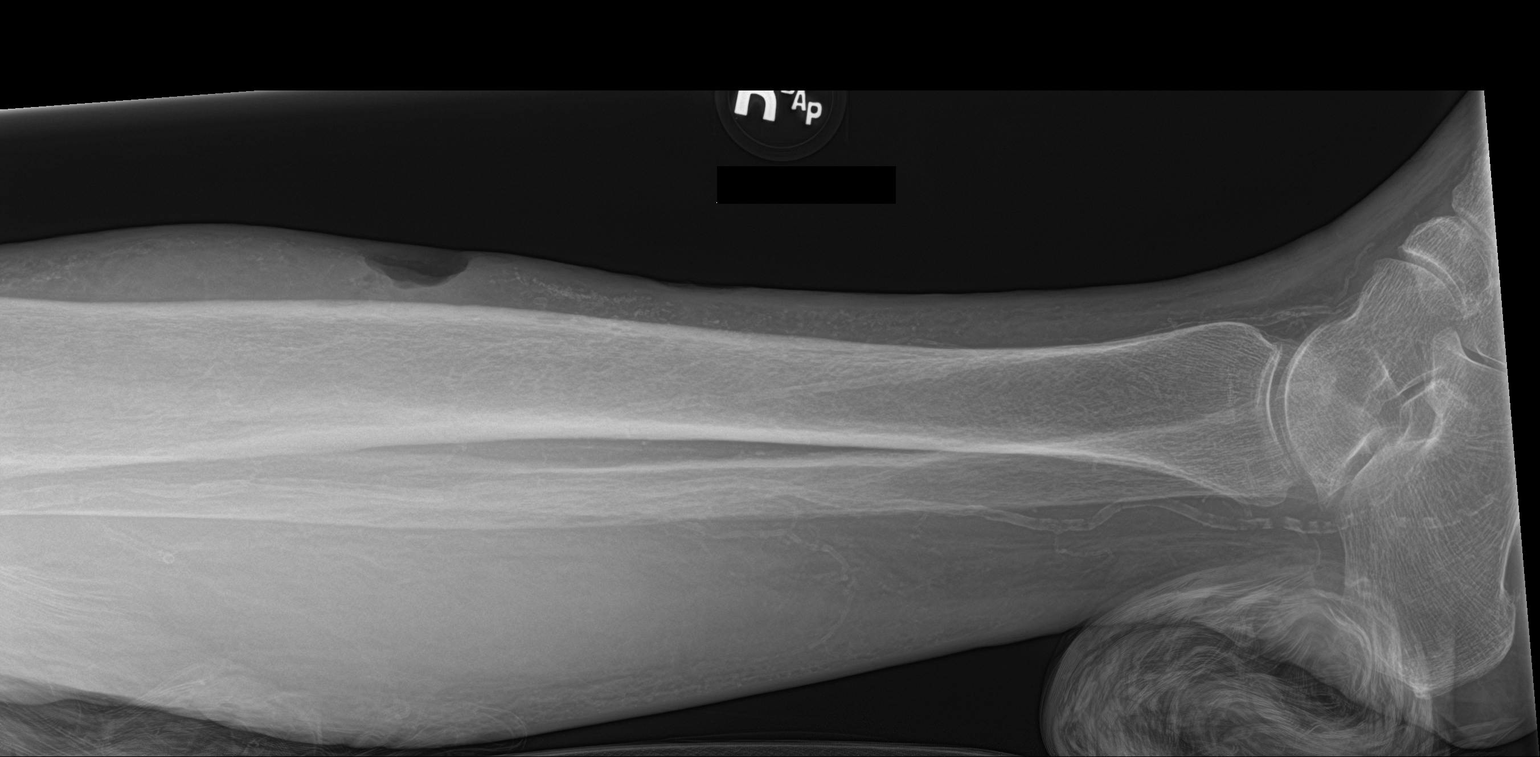

[4 of 4 positions shown; findings below may reference images not displayed]

FINDINGS: Diffuse soft tissue swelling. Diffuse arterial calcifications. No
soft tissue gas, bone destruction or periosteal reaction. An
anterior mid lower leg soft tissue ulceration is again demonstrated.
Diffuse osteopenia.
IMPRESSION: 1. Anterior soft tissue ulceration without evidence of underlying
osteomyelitis.
2. No soft tissue gas.
3. Extensive atheromatous arterial calcifications.

## 2019-10-24 IMAGING — MR MR [PERSON_NAME] LOW W/O CM*R*
4 of 6 series · 18 of 40 positions shown · non-contrast
Comparison: Radiographs of the right leg from 01/04/2018

CLINICAL DATA: 51-year-old female with history of chronic renal
disease, stage IV with renal transplant on immunosuppression, type 2
diabetes and chronic right leg wound presenting with increased pain
and swelling as well as discharge.

EXAM:
MRI OF LOWER RIGHT EXTREMITY WITHOUT CONTRAST
TECHNIQUE: Multiplanar, multisequence MR imaging of the right leg was
performed. No intravenous contrast was administered.

[Series 3: T1 · coronal · 5.0mm · 0.78mm/px · 3 of 23 slices shown (1 of 3)]
[im 1/23]
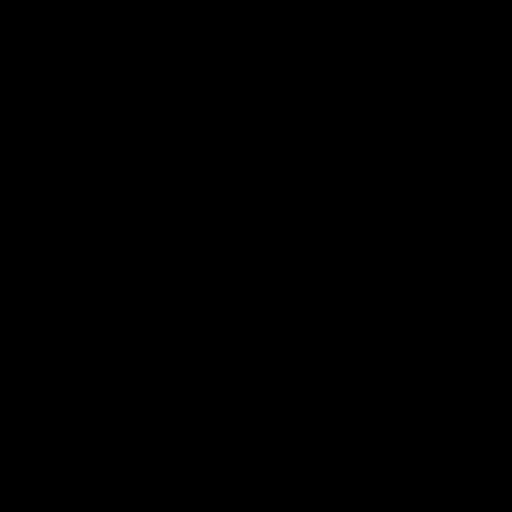
[im 15/23]
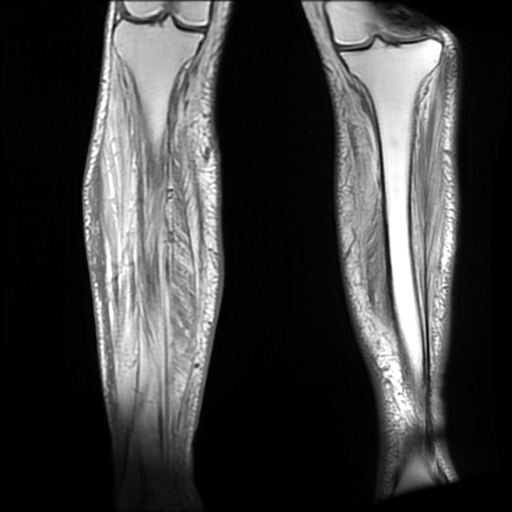
[im 23/23]
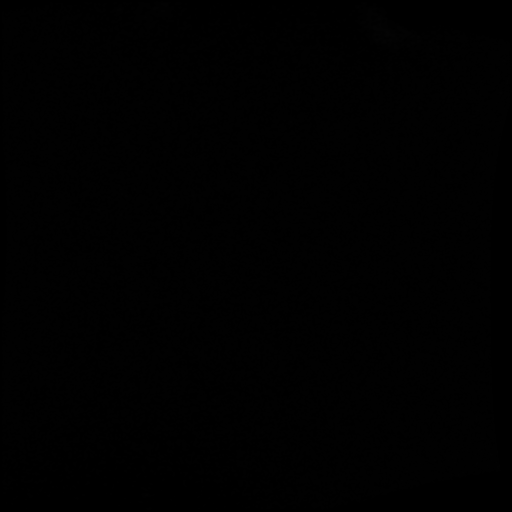

[Series 5: T1 · axial · 5.0mm · 0.68mm/px · z∈[-335,-21]mm · 3 of 61 slices shown (2 of 3)]
[im 7/61]
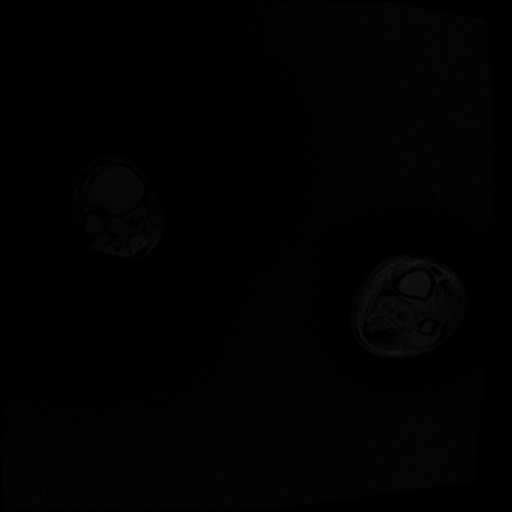
[im 31/61]
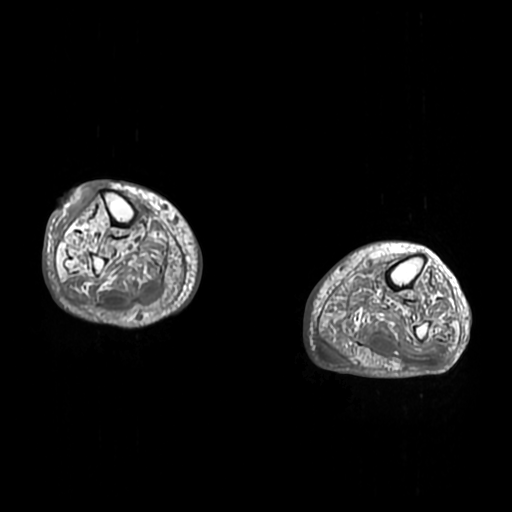
[im 55/61]
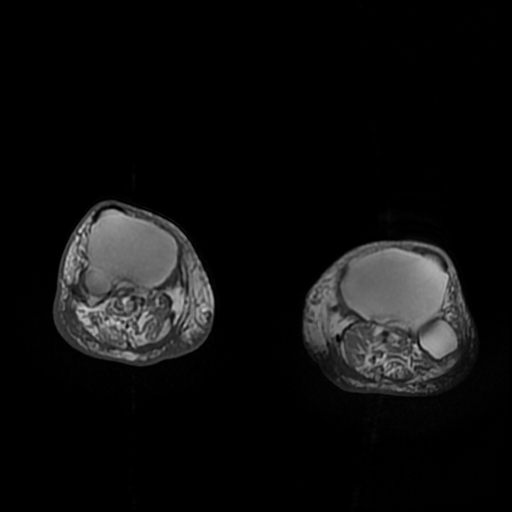

[Series 6: T2 fat-sat · axial · 5.0mm · 0.68mm/px · z∈[-374,-27]mm · 9 of 62 slices shown]
[im 1/62]
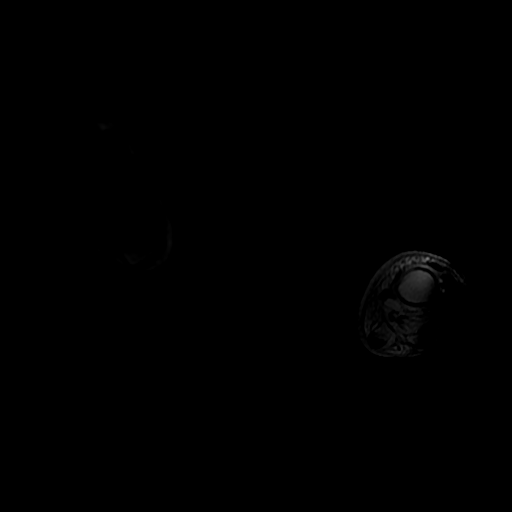
[im 7/62]
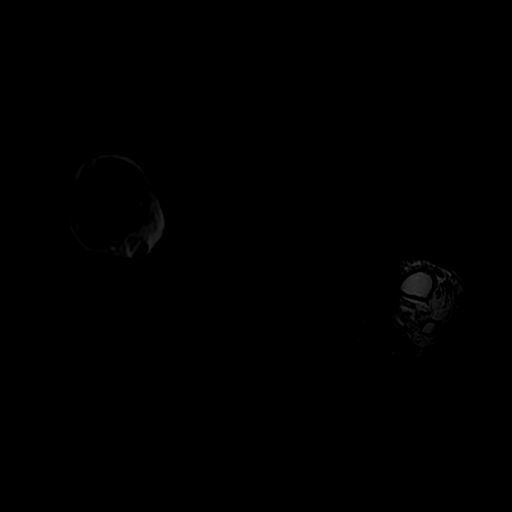
[im 13/62]
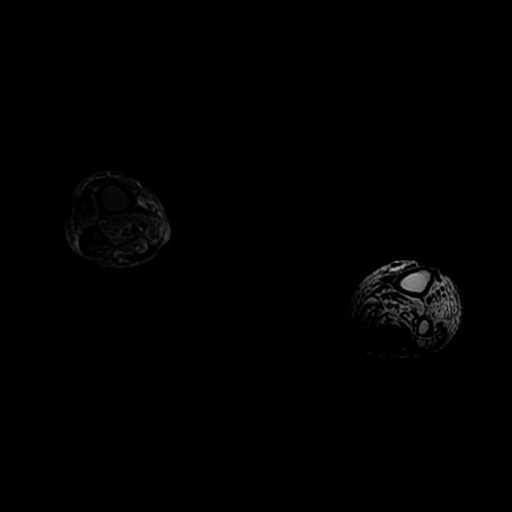
[im 19/62]
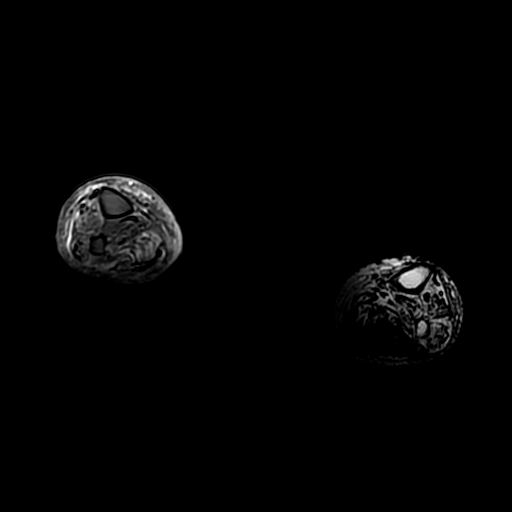
[im 25/62]
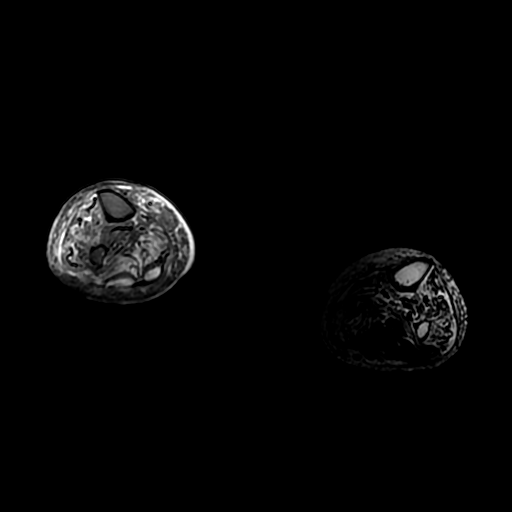
[im 31/62]
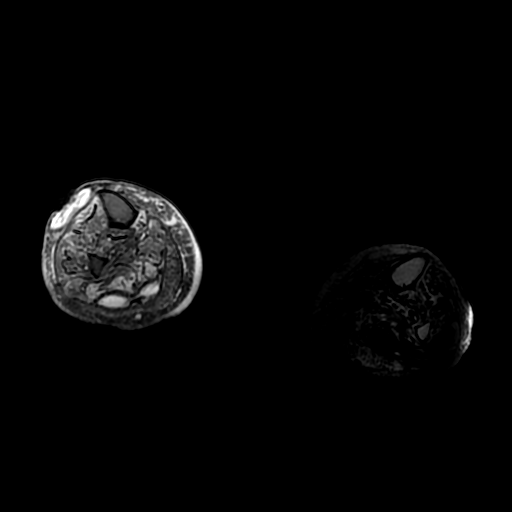
[im 37/62]
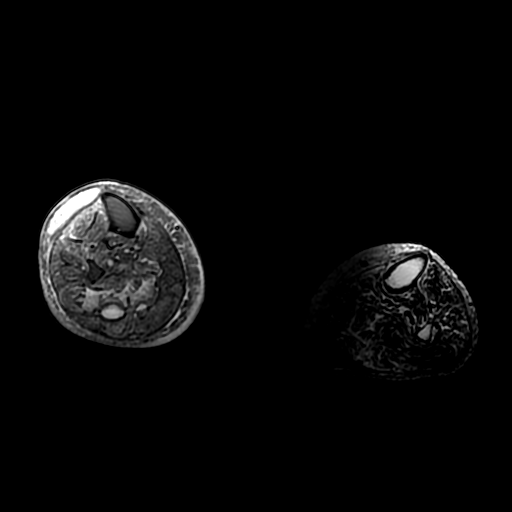
[im 43/62]
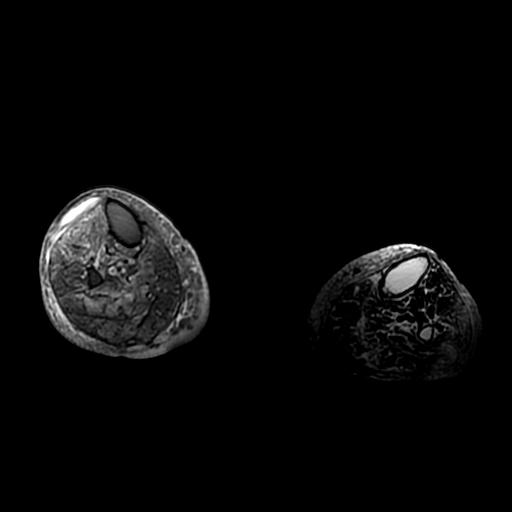
[im 55/62]
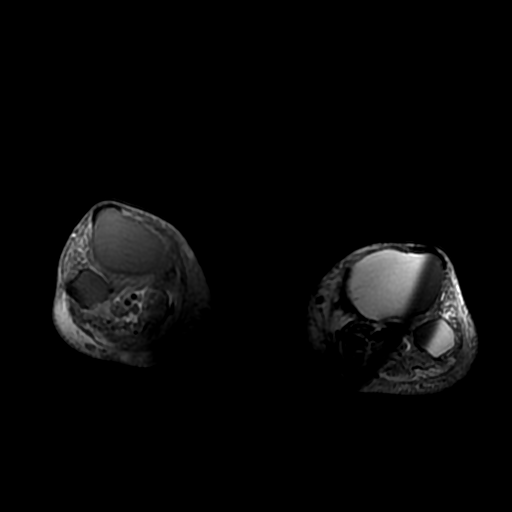

[Series 9: T1 · sagittal · 5.0mm · 0.78mm/px · 3 of 25 slices shown (3 of 3)]
[im 1/25]
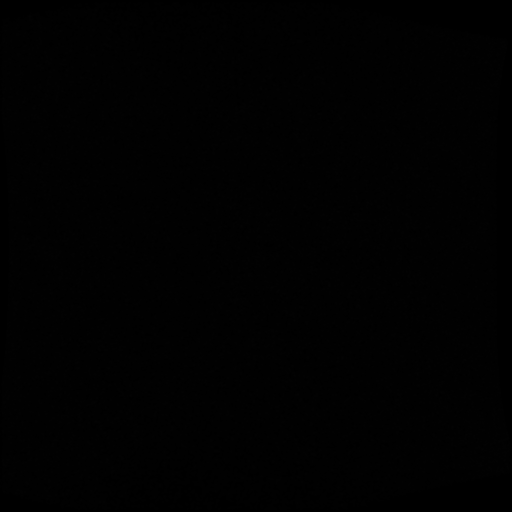
[im 13/25]
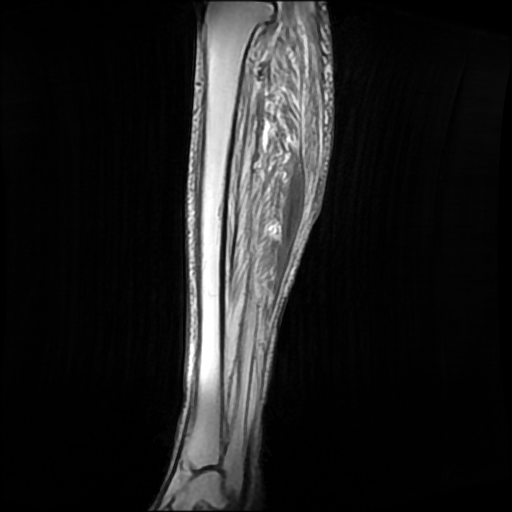
[im 25/25]
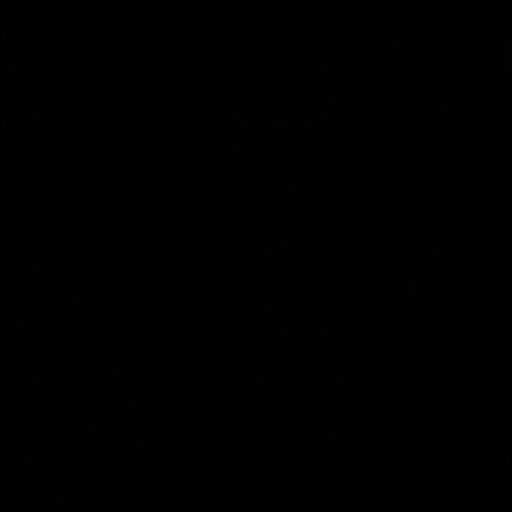

[18 of 40 positions shown; findings below may reference images not displayed]

FINDINGS: Bones/Joint/Cartilage

Mild degenerative joint space narrowing of the included knees. No
focal chondral defects. No significant joint effusion. No marrow
signal abnormalities of the included tibia and fibula of either leg
to suggest acute osteomyelitis. No acute fracture bone destruction.

Ligaments

Noncontributory

Muscles and Tendons

Generalized muscle atrophy and myositis of both included legs.

Soft tissues

Right leg:

There is an approximately 2.4 x 1.6 x 0.9 cm (CC x AP x Trv) ulcer
along the anterolateral aspect of the right mid leg. This is
associated with a thin tracking layer of subcutaneous fluid spanning
at least 10.6 x 4.4 x 0.7 cm in CC by AP by transverse, series [DATE]
and series [DATE]. Similar ovoid linear fluid collections are seen
interposed between the gastrocnemius and soleus muscles of the right
leg, the more lateral measuring at least 11.6 x 2.7 x 1.2 cm and the
more medial measuring at least 11.6 x 2.1 x 1.4 cm.

Left leg: Large subcutaneous tracking fluid collection measuring
22.4 x 5.8 x 2.2 cm, series [DATE] and series [DATE] overlying the
gastrocnemius muscles. Additional tracking fluid measuring 18.3 x
5.6 x 1.4 cm is seen interposed between the gastrocnemius and soleus
muscles.
IMPRESSION: 1. 2.4 x 1.6 x 0.9 cm ulcer involving the anterolateral aspect of
the right mid leg associated with a thin tracking layer of soft
cutaneous fluid measuring 10.6 x 4.4 x 0.7 cm. Two additional
tracking fluid collections are seen interposed between the
gastrocnemius and soleus muscles of the right leg measuring 11.6 x
2.7 x 1.2 cm laterally and 11.6 x 2.1 x 1.4 cm medially.
2. Tracking fluid collections in the subcutaneous soft tissues of
the posterior left leg measuring 22.4 x 5.8 x 2.2 cm with a slightly
deeper tracking fluid collection interposed between the
gastrocnemius and soleus muscles measuring 18.3 x 5.6 x 1.4 cm.
3. No associated marrow signal abnormalities to indicate acute
osteomyelitis of either included leg.
4. Generalized diffuse soft tissue and intramuscular edema
compatible with cellulitis/third spacing of fluid and myositis of
both legs.

## 2020-05-12 IMAGING — CT CT HEAD W/O CM
4 series · 17 of 47 positions shown, 19 images · non-contrast
Comparison: 07/19/2018

CLINICAL DATA: 52-year-old female with altered level of
consciousness.

EXAM:
CT HEAD WITHOUT CONTRAST
TECHNIQUE: Contiguous axial images were obtained from the base of the skull
through the vertex without intravenous contrast.

[Series 3: head without · axial · non-contrast · 0.41mm/px · z∈[-168,-38]mm · 7 of 36 slices shown, 9 images]
[im 5/36  brain]
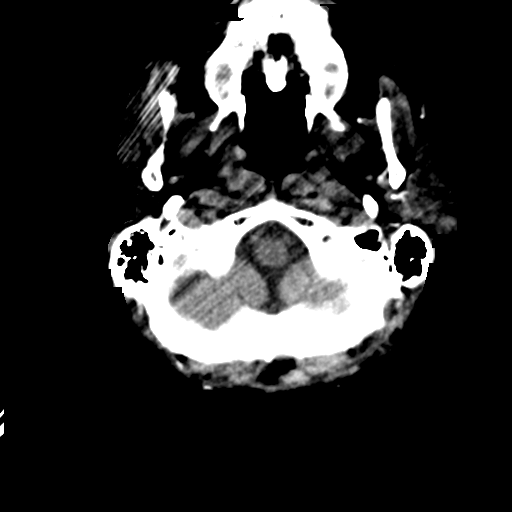
[im 5/36  bone]
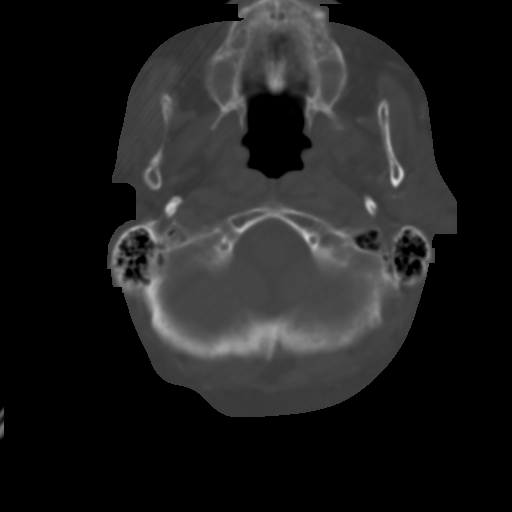
[im 9/36  brain]
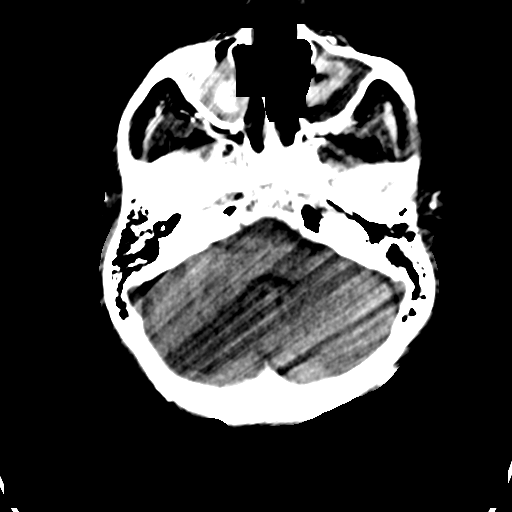
[im 14/36  brain]
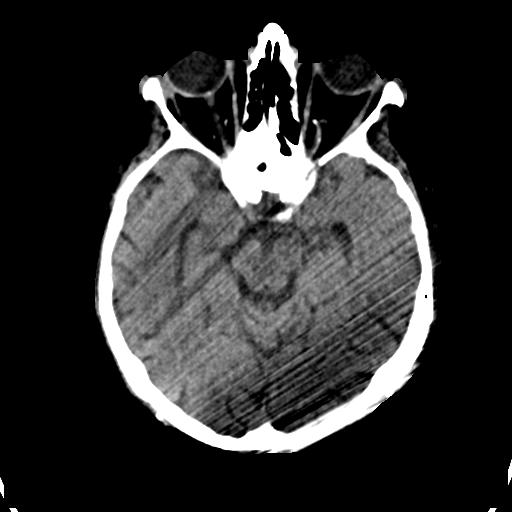
[im 18/36  brain]
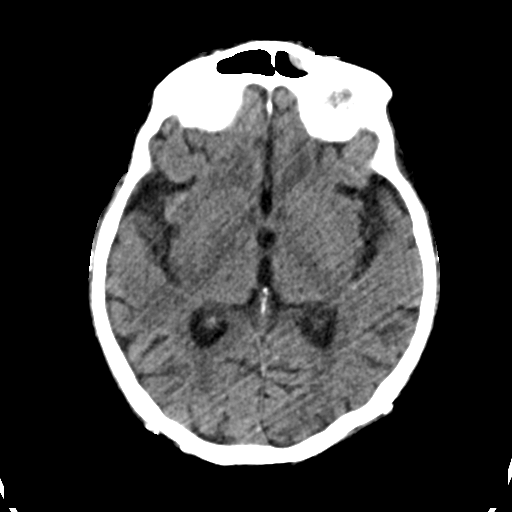
[im 22/36  brain]
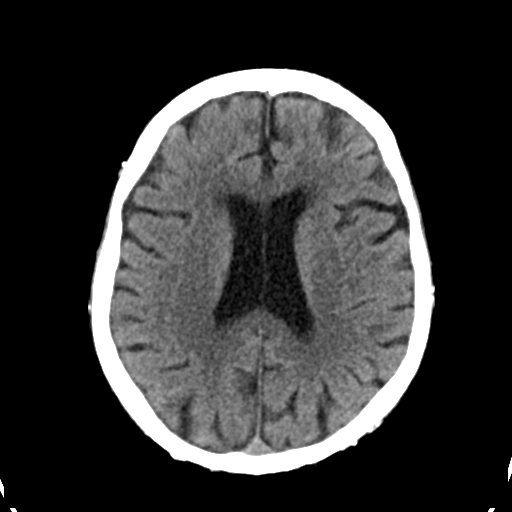
[im 22/36  bone]
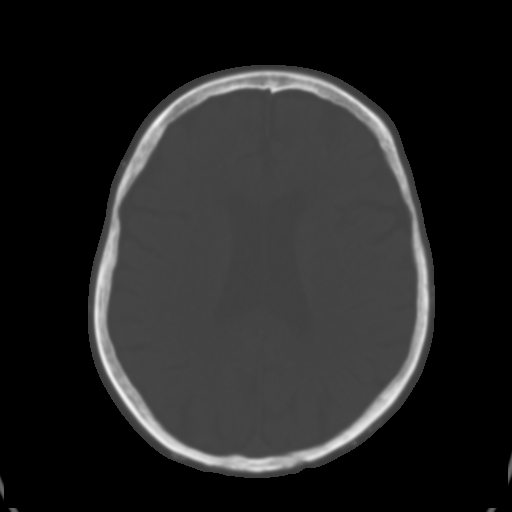
[im 27/36  brain]
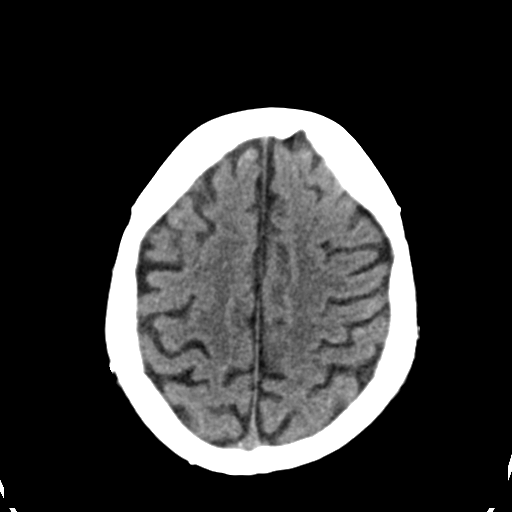
[im 31/36  brain]
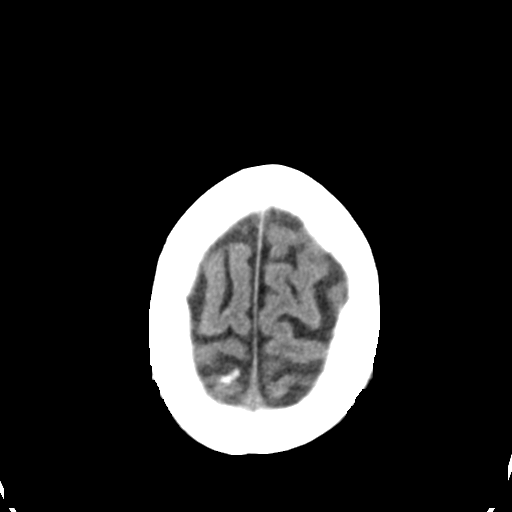

[Series 4: head bone · axial · 0.41mm/px · z∈[-172,-108]mm · 4 of 90 slices shown]
[im 9/90  bone]
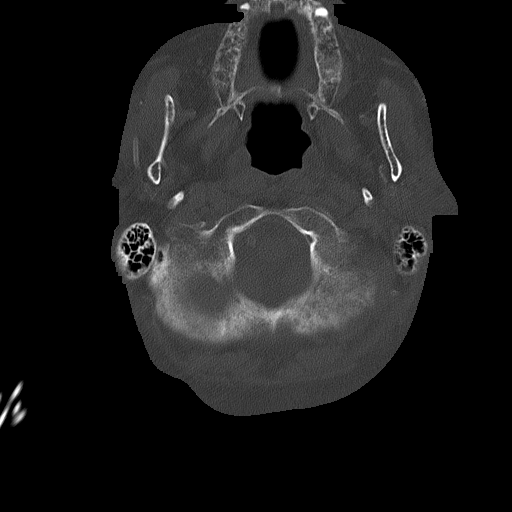
[im 18/90  bone]
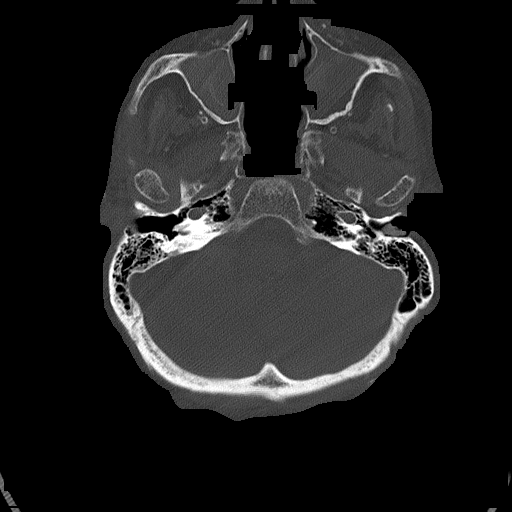
[im 27/90  bone]
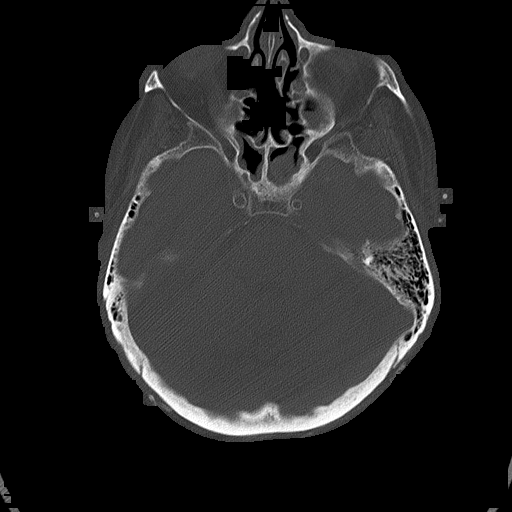
[im 41/90  bone]
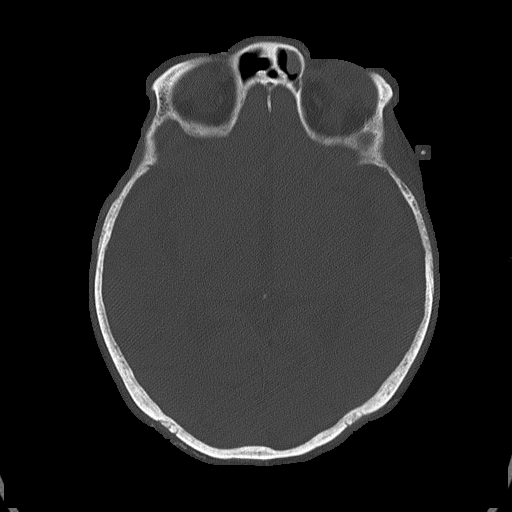

[Series 5: head without cor · coronal · non-contrast · 0.33mm/px · 3 of 67 slices shown]
[im 23/67  brain]
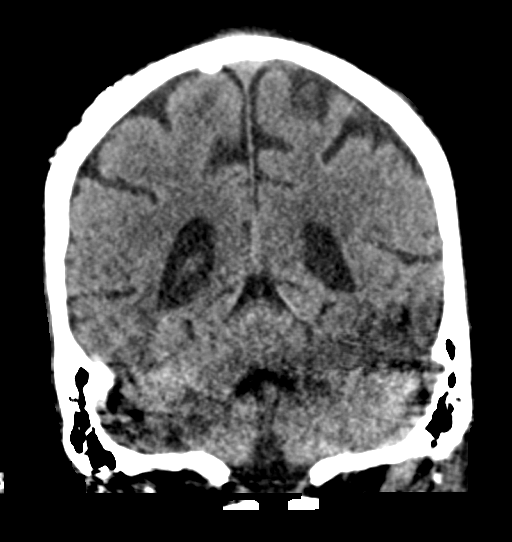
[im 30/67  brain]
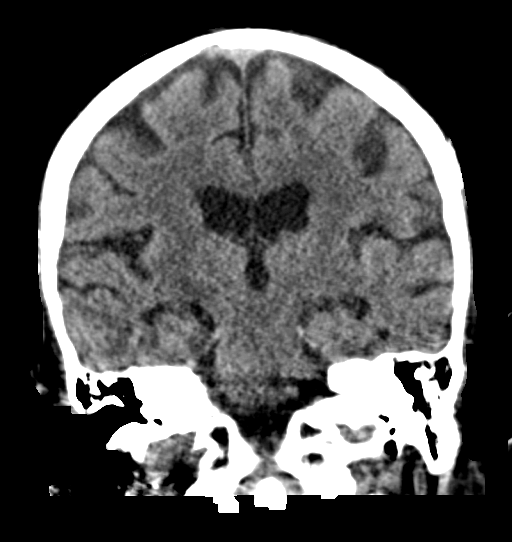
[im 37/67  brain]
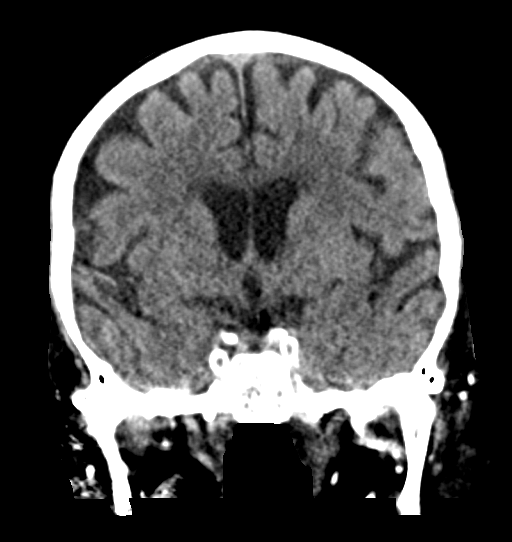

[Series 6: head without sag · sagittal · non-contrast · 0.35mm/px · 3 of 59 slices shown]
[im 20/59  brain]
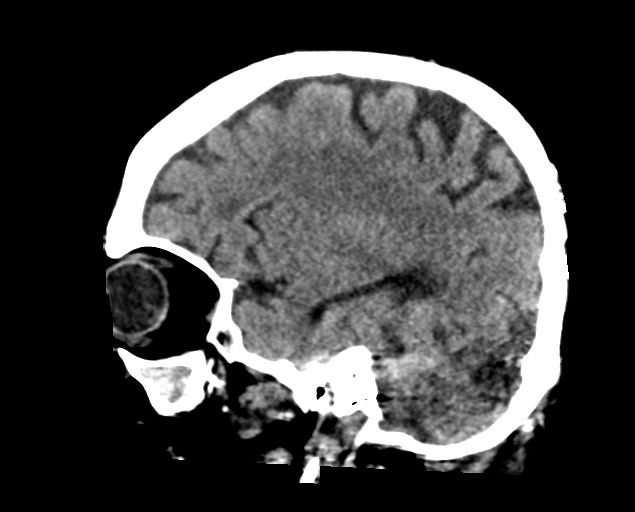
[im 30/59  brain]
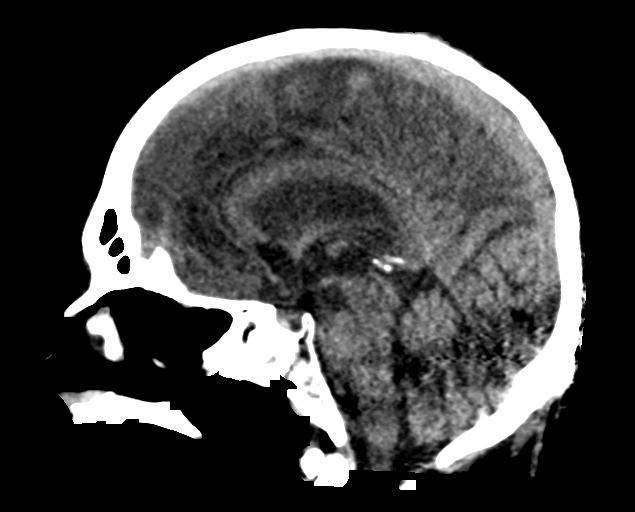
[im 39/59  brain]
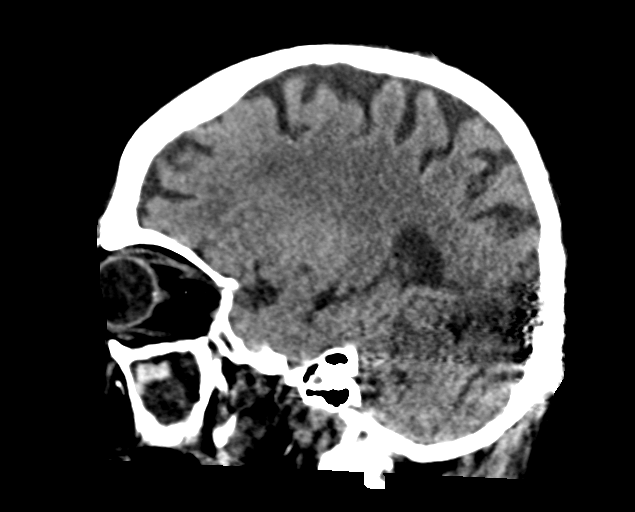

[17 of 47 positions shown; findings below may reference images not displayed]

FINDINGS: Motif shin artifact decreases sensitivity despite rescanning.

Brain: No evidence of acute infarction, hemorrhage, hydrocephalus,
extra-axial collection or mass lesion/mass effect.

Mild atrophy and chronic small-vessel white matter ischemic changes
again noted.

Vascular: Vertebral and carotid atherosclerotic calcifications
noted.

Skull: No acute abnormality.

Sinuses/Orbits: No acute abnormality. Mucoperiosteal thickening of
the paranasal sinuses again noted.

Other: None
IMPRESSION: 1. No evidence of acute intracranial abnormality
2. Mild atrophy and chronic small-vessel white matter ischemic
changes
3. Chronic paranasal sinus disease.

## 2020-05-17 IMAGING — DX DG CHEST 1V PORT
1 series · 1 of 1 positions shown · non-contrast
Comparison: 07/20/2018

CLINICAL DATA: Hypoxia

EXAM:
PORTABLE CHEST 1 VIEW

[chest ap]
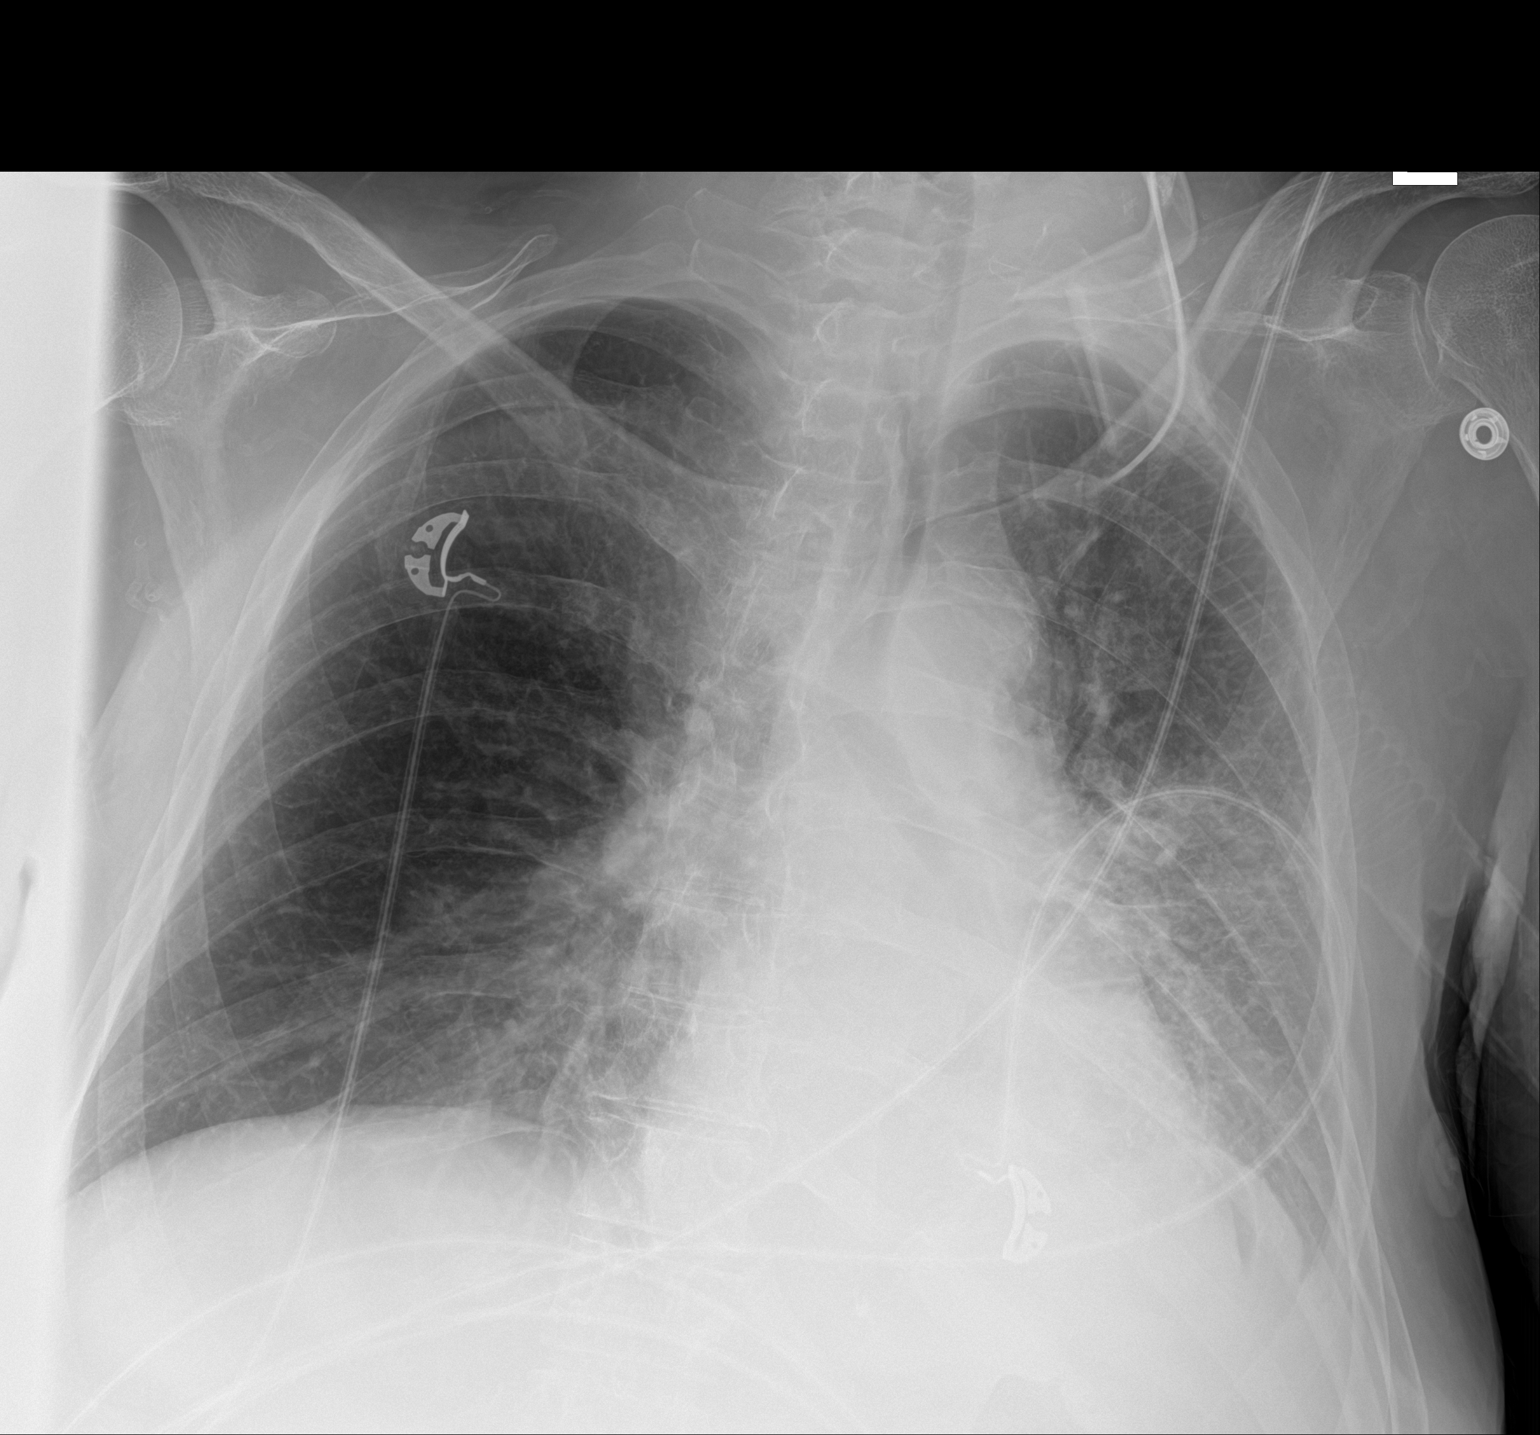

[1 of 1 positions shown; findings below may reference images not displayed]

FINDINGS: Exam is rotated to the left. Increased left mid and lower lung mixed
interstitial and airspace opacities concerning for developing left
lung pneumonia or asymmetric edema. Right lung remains clear. Small
left effusion not excluded. No large pneumothorax. Trachea is
midline. Aorta atherosclerotic. Bones are osteopenic.
IMPRESSION: Increased left mid and lower lung mixed interstitial and airspace
opacity with a small left pleural effusion concerning for asymmetric
edema or pneumonia.

Atherosclerosis

## 2020-05-19 IMAGING — CT CT HEAD W/O CM
5 of 6 series · 17 of 47 positions shown, 18 images · non-contrast
Comparison: 07/25/2018 CT head

CLINICAL DATA: 52 y/o F; increased confusion. Recent treatment for
toxic metabolic encephalopathy.

EXAM:
CT HEAD WITHOUT CONTRAST
TECHNIQUE: Contiguous axial images were obtained from the base of the skull
through the vertex without intravenous contrast.

[Series 3: head bone · axial · 0.42mm/px · z∈[-137,-25]mm · 7 of 82 slices shown]
[im 9/82  bone]
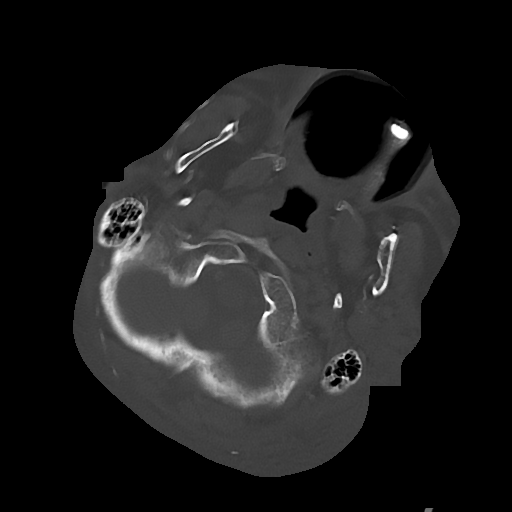
[im 17/82  bone]
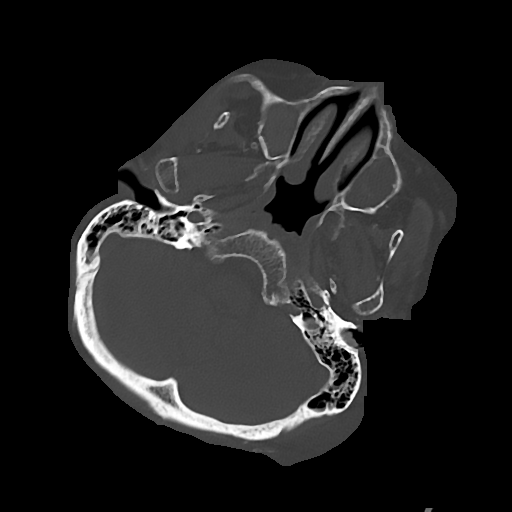
[im 25/82  bone]
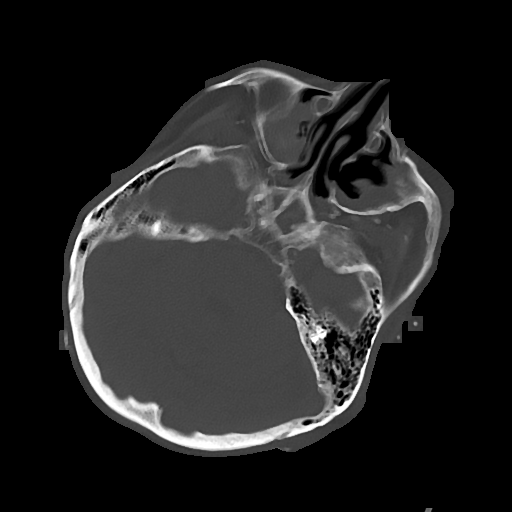
[im 33/82  bone]
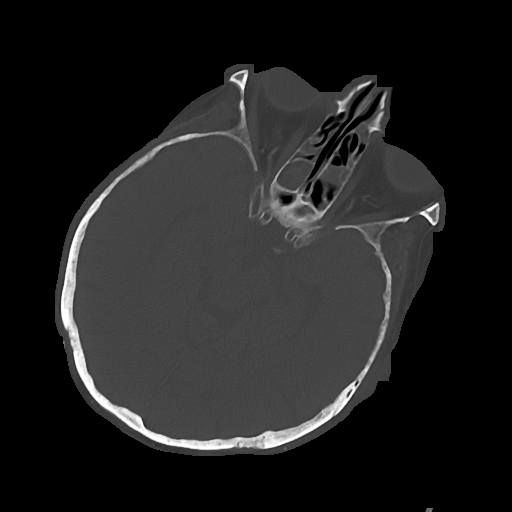
[im 49/82  bone]
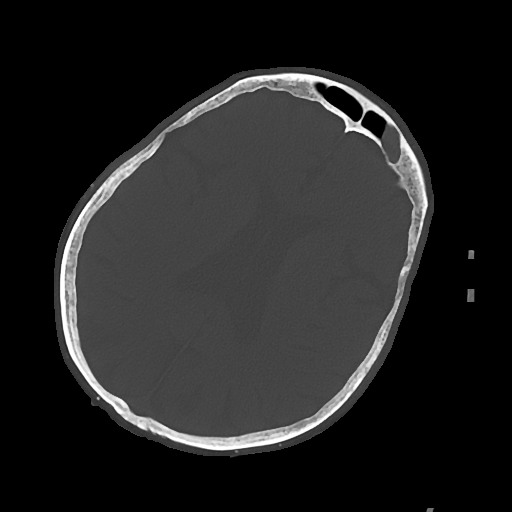
[im 57/82  bone]
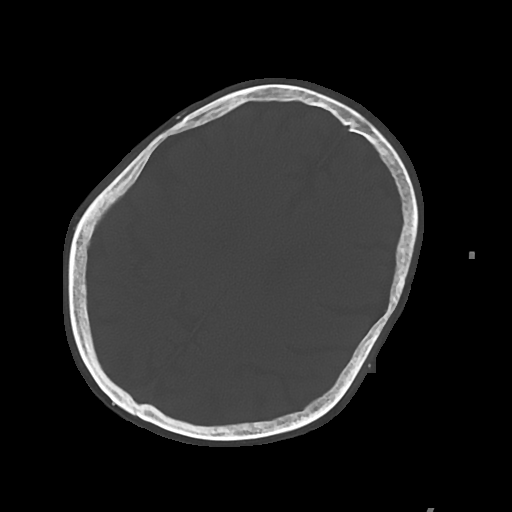
[im 65/82  bone]
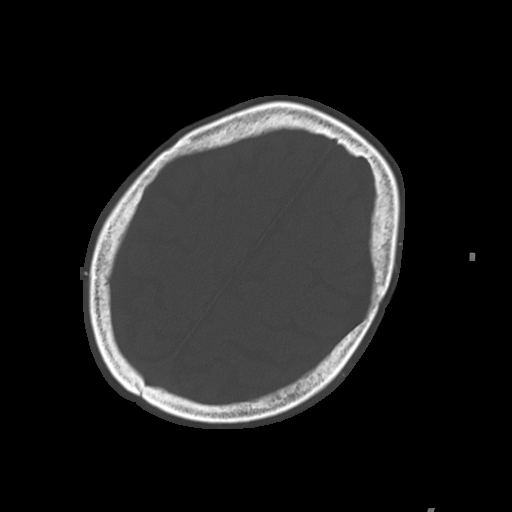

[Series 4: head wo · axial · 0.42mm/px · z∈[-103,-48]mm · 2 of 33 slices shown, 3 images (1 of 2)]
[im 11/33  brain]
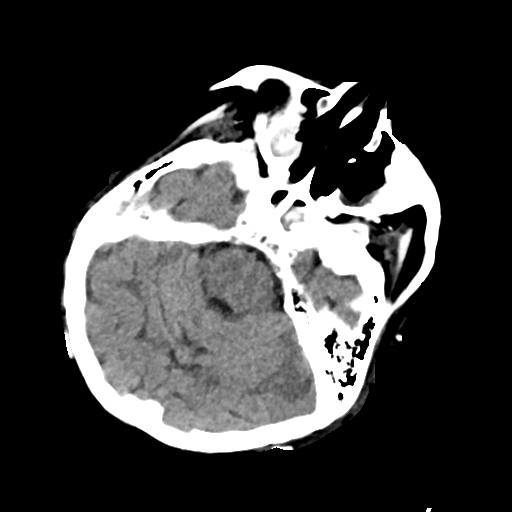
[im 11/33  bone]
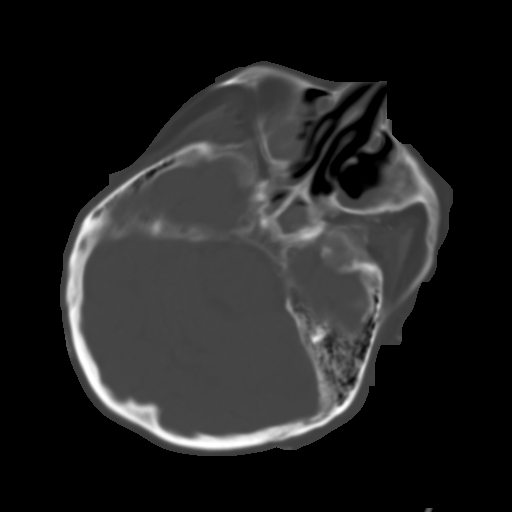
[im 22/33  brain]
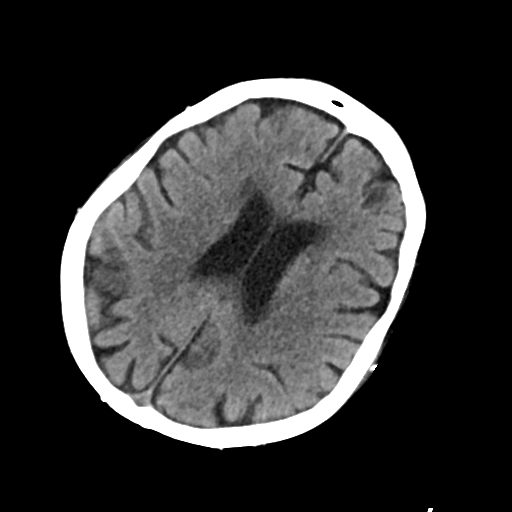

[Series 7: head wo · axial · 0.42mm/px · z∈[-103,-48]mm · 2 of 33 slices shown (2 of 2)]
[im 11/33  brain]
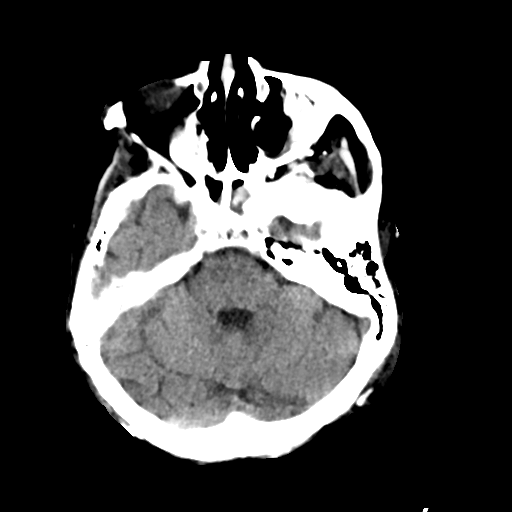
[im 22/33  brain]
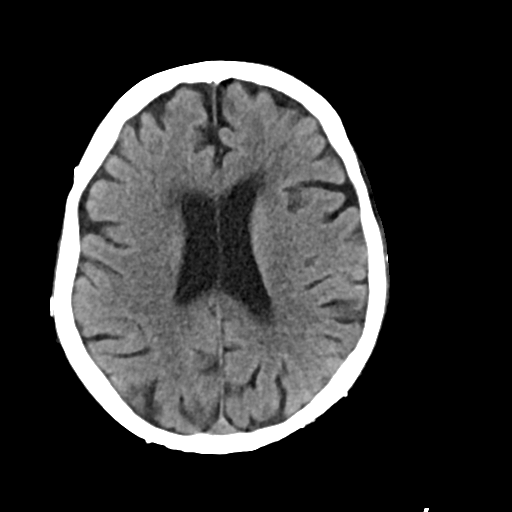

[Series 9: cor soft · coronal · 0.36mm/px · 3 of 61 slices shown]
[im 21/61  brain]
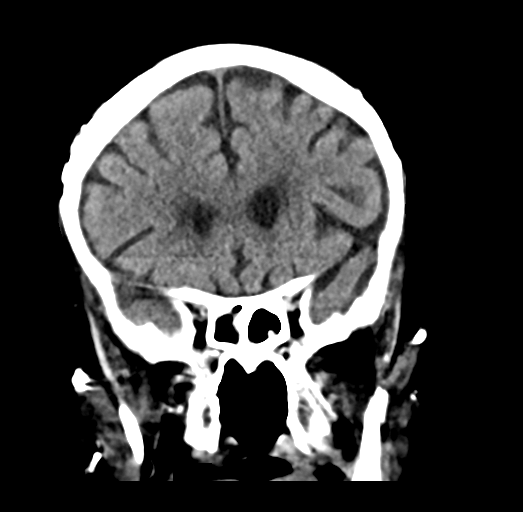
[im 27/61  brain]
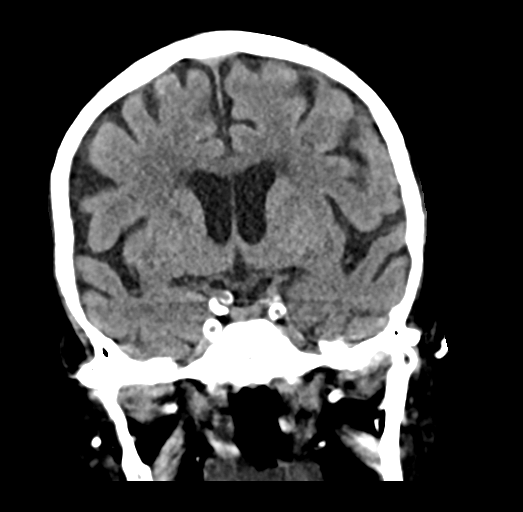
[im 34/61  brain]
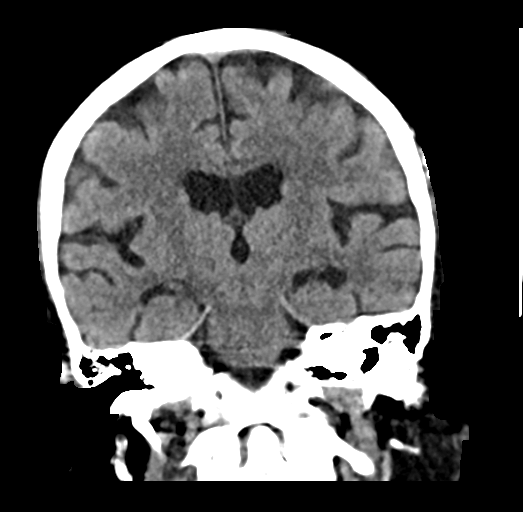

[Series 10: sag soft · sagittal · 0.35mm/px · 3 of 53 slices shown]
[im 18/53  brain]
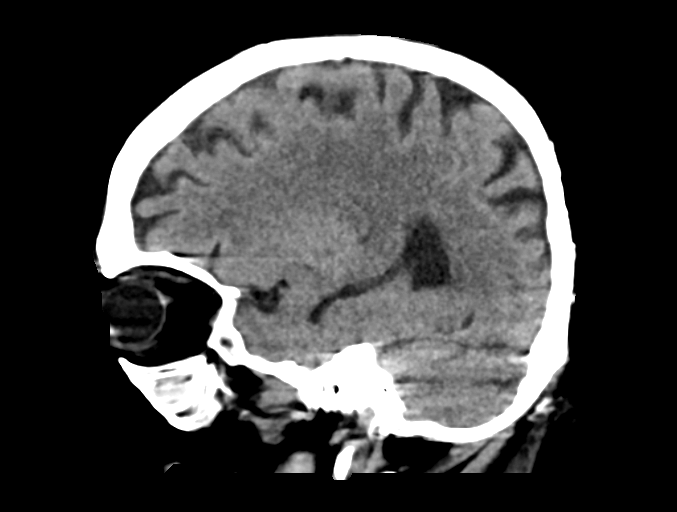
[im 27/53  brain]
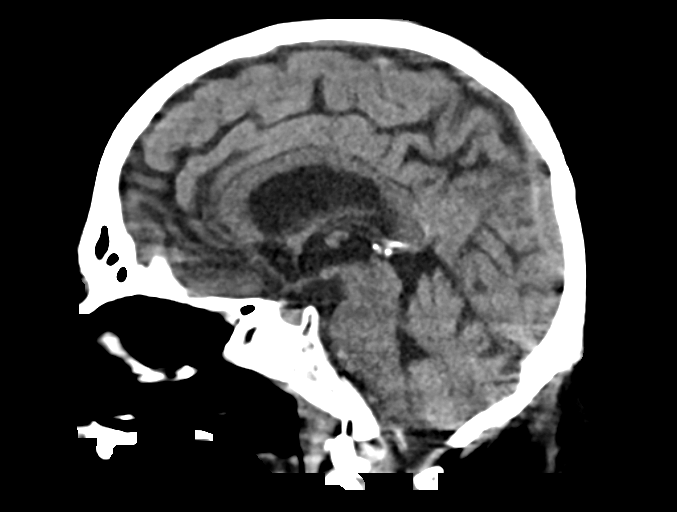
[im 35/53  brain]
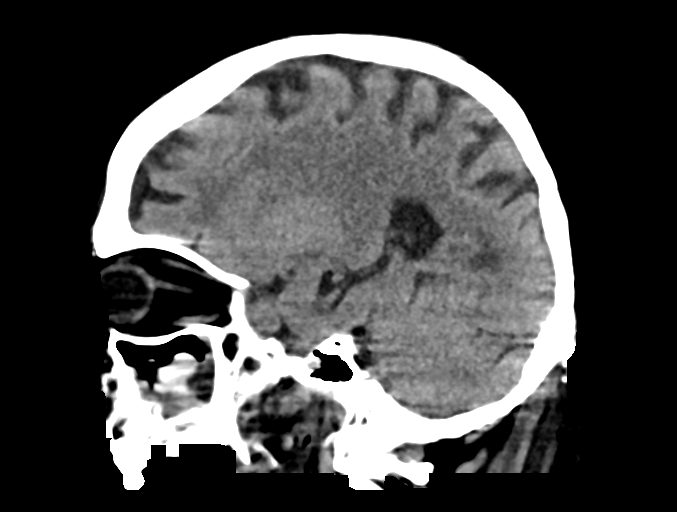

[17 of 47 positions shown; findings below may reference images not displayed]

FINDINGS: Brain: No evidence of acute infarction, hemorrhage, hydrocephalus,
extra-axial collection or mass lesion/mass effect. Stable chronic
microvascular ischemic changes and volume loss of the brain. Stable
small chronic lacunar infarct within the right anterior limb of
internal capsule.

Vascular: No hyperdense vessel or unexpected calcification.

Skull: Normal. Negative for fracture or focal lesion.

Sinuses/Orbits: Opacification of sphenoid and maxillary sinuses,
partial opacification of the right posterior ethmoid air cells, and
diffuse paranasal sinus mucosal thickening. There are chronic
inflammatory changes of the walls of maxillary and sphenoid sinuses
and sinus secretions are increased in density indicating
inspissation or fungal elements. Normal aeration of the mastoid air
cells. Bilateral intra-ocular lens replacement.

Other: Anasarca of subcutaneous fat in the posterior neck.
IMPRESSION: 1. No acute intracranial abnormality identified.
2. Stable chronic microvascular ischemic changes and volume loss of
the brain.
3. Stable extensive paranasal sinus disease with chronic
inflammatory changes in the maxillary and sphenoid sinuses.

## 2020-05-20 IMAGING — DX DG CHEST 1V PORT
1 series · 1 of 1 positions shown · non-contrast
Comparison: 07/30/2018 chest radiograph.

CLINICAL DATA: 52 y/o  F; encounter for central line placement.

EXAM:
PORTABLE CHEST 1 VIEW

[chest]
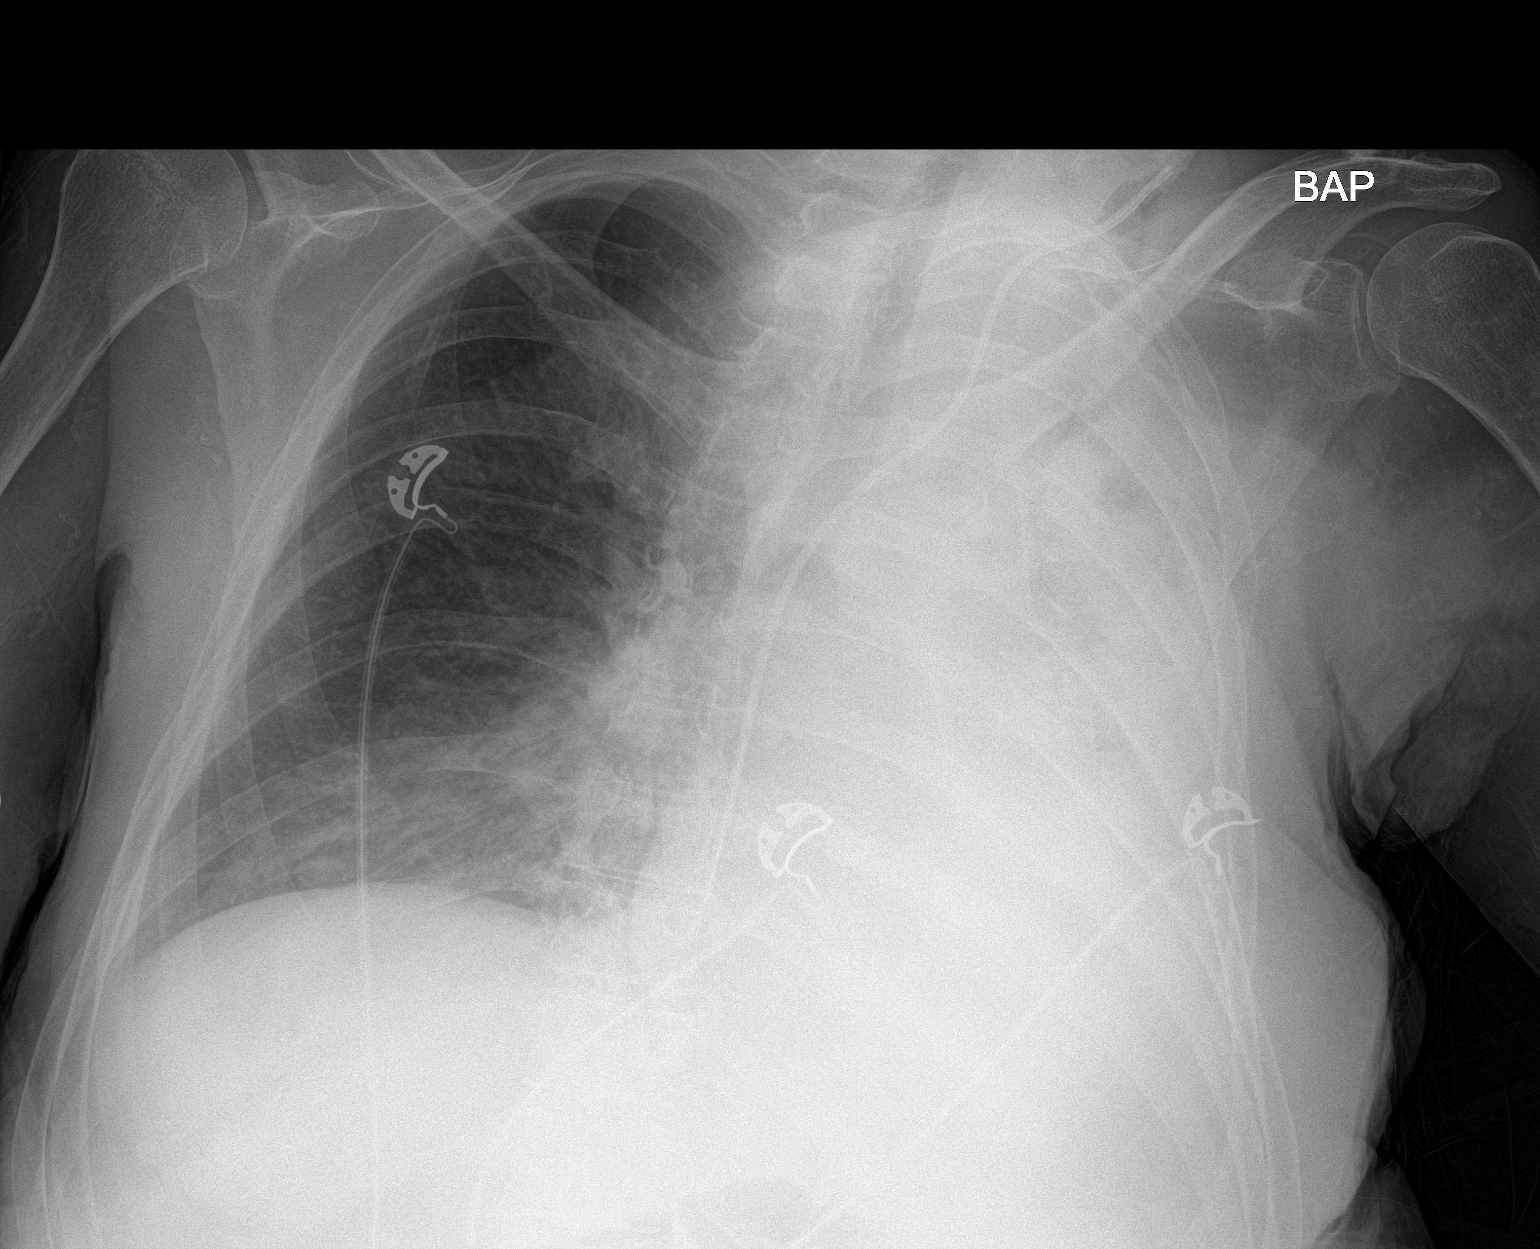

[1 of 1 positions shown; findings below may reference images not displayed]

FINDINGS: Near complete opacification of left hemithorax. Left central venous
catheter tip projects over the right atrium. Cardiac silhouette is
obscured by left hemithorax opacification. Stable hazy opacification
at the right lung base. Calcific aortic atherosclerosis. No acute
osseous abnormality is evident.
IMPRESSION: 1. Left central venous catheter tip projects over the right atrium.
2. Progressed near complete opacification of left hemithorax. Stable
hazy opacification of the right lung base.
